# Patient Record
Sex: Male | Born: 1960
Health system: Southern US, Community
[De-identification: ages and names within clinical notes are randomized; demographics above are authoritative.]

## PROBLEM LIST (undated history)

## (undated) ENCOUNTER — Emergency Department (HOSPITAL_COMMUNITY): Admission: EM | Payer: MEDICAID | Source: Home / Self Care

## (undated) ENCOUNTER — Emergency Department (HOSPITAL_COMMUNITY): Admission: EM | Payer: Medicare Other | Source: Home / Self Care

## (undated) ENCOUNTER — Emergency Department (HOSPITAL_COMMUNITY): Admission: EM | Payer: Medicaid Other | Source: Home / Self Care

## (undated) VITALS — BP 144/110 | HR 85 | Temp 98.0°F | Resp 23 | Ht 69.0 in | Wt 246.0 lb

## (undated) DIAGNOSIS — Z5900 Homelessness unspecified: Secondary | ICD-10-CM

## (undated) DIAGNOSIS — M79673 Pain in unspecified foot: Secondary | ICD-10-CM

## (undated) DIAGNOSIS — F1910 Other psychoactive substance abuse, uncomplicated: Secondary | ICD-10-CM

## (undated) DIAGNOSIS — G8929 Other chronic pain: Secondary | ICD-10-CM

## (undated) DIAGNOSIS — B1920 Unspecified viral hepatitis C without hepatic coma: Secondary | ICD-10-CM

## (undated) DIAGNOSIS — I10 Essential (primary) hypertension: Secondary | ICD-10-CM

## (undated) DIAGNOSIS — F1410 Cocaine abuse, uncomplicated: Secondary | ICD-10-CM

## (undated) DIAGNOSIS — G4730 Sleep apnea, unspecified: Secondary | ICD-10-CM

## (undated) DIAGNOSIS — G629 Polyneuropathy, unspecified: Secondary | ICD-10-CM

## (undated) DIAGNOSIS — F209 Schizophrenia, unspecified: Secondary | ICD-10-CM

## (undated) DIAGNOSIS — E119 Type 2 diabetes mellitus without complications: Secondary | ICD-10-CM

## (undated) DIAGNOSIS — I5042 Chronic combined systolic (congestive) and diastolic (congestive) heart failure: Secondary | ICD-10-CM

## (undated) HISTORY — PX: MULTIPLE TOOTH EXTRACTIONS: SHX2053

---

## 1997-10-01 ENCOUNTER — Emergency Department (HOSPITAL_COMMUNITY): Admission: EM | Admit: 1997-10-01 | Discharge: 1997-10-01 | Payer: Self-pay | Admitting: Emergency Medicine

## 1997-11-23 ENCOUNTER — Emergency Department (HOSPITAL_COMMUNITY): Admission: EM | Admit: 1997-11-23 | Discharge: 1997-11-23 | Payer: Self-pay | Admitting: Emergency Medicine

## 1998-03-22 ENCOUNTER — Emergency Department (HOSPITAL_COMMUNITY): Admission: EM | Admit: 1998-03-22 | Discharge: 1998-03-22 | Payer: Self-pay | Admitting: Emergency Medicine

## 1998-04-10 ENCOUNTER — Emergency Department (HOSPITAL_COMMUNITY): Admission: EM | Admit: 1998-04-10 | Discharge: 1998-04-10 | Payer: Self-pay | Admitting: Emergency Medicine

## 1998-04-10 ENCOUNTER — Ambulatory Visit: Admission: RE | Admit: 1998-04-10 | Discharge: 1998-04-10 | Payer: Self-pay | Admitting: Internal Medicine

## 1998-04-10 ENCOUNTER — Ambulatory Visit (HOSPITAL_COMMUNITY): Admission: RE | Admit: 1998-04-10 | Discharge: 1998-04-10 | Payer: Self-pay | Admitting: Family Medicine

## 1998-04-22 ENCOUNTER — Ambulatory Visit: Admission: RE | Admit: 1998-04-22 | Discharge: 1998-04-22 | Payer: Self-pay | Admitting: Family Medicine

## 1999-07-23 ENCOUNTER — Emergency Department (HOSPITAL_COMMUNITY): Admission: EM | Admit: 1999-07-23 | Discharge: 1999-07-23 | Payer: Self-pay | Admitting: Emergency Medicine

## 1999-07-23 ENCOUNTER — Encounter: Payer: Self-pay | Admitting: Emergency Medicine

## 1999-08-31 ENCOUNTER — Encounter: Payer: Self-pay | Admitting: *Deleted

## 1999-08-31 ENCOUNTER — Emergency Department (HOSPITAL_COMMUNITY): Admission: EM | Admit: 1999-08-31 | Discharge: 1999-08-31 | Payer: Self-pay | Admitting: Emergency Medicine

## 1999-09-07 ENCOUNTER — Inpatient Hospital Stay (HOSPITAL_COMMUNITY): Admission: EM | Admit: 1999-09-07 | Discharge: 1999-09-10 | Payer: Self-pay | Admitting: *Deleted

## 1999-09-12 ENCOUNTER — Emergency Department (HOSPITAL_COMMUNITY): Admission: EM | Admit: 1999-09-12 | Discharge: 1999-09-12 | Payer: Self-pay | Admitting: Emergency Medicine

## 1999-10-03 ENCOUNTER — Emergency Department (HOSPITAL_COMMUNITY): Admission: EM | Admit: 1999-10-03 | Discharge: 1999-10-03 | Payer: Self-pay | Admitting: Emergency Medicine

## 1999-12-31 ENCOUNTER — Emergency Department (HOSPITAL_COMMUNITY): Admission: EM | Admit: 1999-12-31 | Discharge: 2000-01-01 | Payer: Self-pay | Admitting: Emergency Medicine

## 2000-01-01 ENCOUNTER — Encounter: Payer: Self-pay | Admitting: Emergency Medicine

## 2000-01-06 ENCOUNTER — Emergency Department (HOSPITAL_COMMUNITY): Admission: EM | Admit: 2000-01-06 | Discharge: 2000-01-06 | Payer: Self-pay | Admitting: Emergency Medicine

## 2000-01-10 ENCOUNTER — Emergency Department (HOSPITAL_COMMUNITY): Admission: EM | Admit: 2000-01-10 | Discharge: 2000-01-10 | Payer: Self-pay | Admitting: Emergency Medicine

## 2000-02-01 ENCOUNTER — Emergency Department (HOSPITAL_COMMUNITY): Admission: EM | Admit: 2000-02-01 | Discharge: 2000-02-01 | Payer: Self-pay | Admitting: Emergency Medicine

## 2000-02-11 ENCOUNTER — Encounter: Payer: Self-pay | Admitting: Internal Medicine

## 2000-02-11 ENCOUNTER — Inpatient Hospital Stay: Admission: EM | Admit: 2000-02-11 | Discharge: 2000-02-13 | Payer: Self-pay | Admitting: *Deleted

## 2000-02-12 ENCOUNTER — Encounter: Payer: Self-pay | Admitting: Internal Medicine

## 2000-02-13 ENCOUNTER — Encounter: Payer: Self-pay | Admitting: Internal Medicine

## 2000-02-13 ENCOUNTER — Emergency Department (HOSPITAL_COMMUNITY): Admission: EM | Admit: 2000-02-13 | Discharge: 2000-02-13 | Payer: Self-pay | Admitting: Emergency Medicine

## 2000-02-14 ENCOUNTER — Emergency Department (HOSPITAL_COMMUNITY): Admission: EM | Admit: 2000-02-14 | Discharge: 2000-02-14 | Payer: Self-pay | Admitting: Internal Medicine

## 2000-02-18 ENCOUNTER — Encounter: Admission: RE | Admit: 2000-02-18 | Discharge: 2000-05-18 | Payer: Self-pay | Admitting: Family Medicine

## 2000-02-21 ENCOUNTER — Inpatient Hospital Stay: Admission: EM | Admit: 2000-02-21 | Discharge: 2000-02-24 | Payer: Self-pay | Admitting: *Deleted

## 2000-03-05 ENCOUNTER — Inpatient Hospital Stay (HOSPITAL_COMMUNITY): Admission: EM | Admit: 2000-03-05 | Discharge: 2000-03-10 | Payer: Self-pay | Admitting: *Deleted

## 2000-03-09 ENCOUNTER — Encounter: Payer: Self-pay | Admitting: *Deleted

## 2000-03-12 ENCOUNTER — Emergency Department (HOSPITAL_COMMUNITY): Admission: EM | Admit: 2000-03-12 | Discharge: 2000-03-12 | Payer: Self-pay | Admitting: *Deleted

## 2000-03-15 ENCOUNTER — Emergency Department (HOSPITAL_COMMUNITY): Admission: EM | Admit: 2000-03-15 | Discharge: 2000-03-15 | Payer: Self-pay | Admitting: Emergency Medicine

## 2000-03-18 ENCOUNTER — Emergency Department (HOSPITAL_COMMUNITY): Admission: EM | Admit: 2000-03-18 | Discharge: 2000-03-18 | Payer: Self-pay | Admitting: Emergency Medicine

## 2000-03-20 ENCOUNTER — Emergency Department (HOSPITAL_COMMUNITY): Admission: EM | Admit: 2000-03-20 | Discharge: 2000-03-20 | Payer: Self-pay | Admitting: Emergency Medicine

## 2000-03-21 ENCOUNTER — Emergency Department (HOSPITAL_COMMUNITY): Admission: EM | Admit: 2000-03-21 | Discharge: 2000-03-21 | Payer: Self-pay | Admitting: Emergency Medicine

## 2000-03-25 ENCOUNTER — Emergency Department (HOSPITAL_COMMUNITY): Admission: EM | Admit: 2000-03-25 | Discharge: 2000-03-25 | Payer: Self-pay | Admitting: *Deleted

## 2000-04-01 ENCOUNTER — Emergency Department (HOSPITAL_COMMUNITY): Admission: EM | Admit: 2000-04-01 | Discharge: 2000-04-02 | Payer: Self-pay | Admitting: Emergency Medicine

## 2000-04-03 ENCOUNTER — Emergency Department (HOSPITAL_COMMUNITY): Admission: EM | Admit: 2000-04-03 | Discharge: 2000-04-03 | Payer: Self-pay | Admitting: Emergency Medicine

## 2000-04-06 ENCOUNTER — Emergency Department (HOSPITAL_COMMUNITY): Admission: EM | Admit: 2000-04-06 | Discharge: 2000-04-06 | Payer: Self-pay | Admitting: Emergency Medicine

## 2000-04-25 ENCOUNTER — Emergency Department (HOSPITAL_COMMUNITY): Admission: EM | Admit: 2000-04-25 | Discharge: 2000-04-25 | Payer: Self-pay | Admitting: Emergency Medicine

## 2000-05-06 ENCOUNTER — Inpatient Hospital Stay (HOSPITAL_COMMUNITY): Admission: EM | Admit: 2000-05-06 | Discharge: 2000-05-08 | Payer: Self-pay | Admitting: *Deleted

## 2000-06-05 ENCOUNTER — Emergency Department (HOSPITAL_COMMUNITY): Admission: EM | Admit: 2000-06-05 | Discharge: 2000-06-05 | Payer: Self-pay | Admitting: Emergency Medicine

## 2000-10-08 ENCOUNTER — Emergency Department (HOSPITAL_COMMUNITY): Admission: EM | Admit: 2000-10-08 | Discharge: 2000-10-09 | Payer: Self-pay | Admitting: Emergency Medicine

## 2000-11-23 ENCOUNTER — Emergency Department (HOSPITAL_COMMUNITY): Admission: EM | Admit: 2000-11-23 | Discharge: 2000-11-23 | Payer: Self-pay | Admitting: Emergency Medicine

## 2000-11-24 ENCOUNTER — Emergency Department (HOSPITAL_COMMUNITY): Admission: EM | Admit: 2000-11-24 | Discharge: 2000-11-24 | Payer: Self-pay | Admitting: Emergency Medicine

## 2000-11-24 ENCOUNTER — Encounter: Payer: Self-pay | Admitting: Emergency Medicine

## 2000-11-26 ENCOUNTER — Emergency Department (HOSPITAL_COMMUNITY): Admission: EM | Admit: 2000-11-26 | Discharge: 2000-11-26 | Payer: Self-pay | Admitting: Emergency Medicine

## 2000-11-27 ENCOUNTER — Emergency Department (HOSPITAL_COMMUNITY): Admission: EM | Admit: 2000-11-27 | Discharge: 2000-11-27 | Payer: Self-pay | Admitting: Emergency Medicine

## 2000-11-27 ENCOUNTER — Inpatient Hospital Stay (HOSPITAL_COMMUNITY): Admission: EM | Admit: 2000-11-27 | Discharge: 2000-11-30 | Payer: Self-pay | Admitting: Emergency Medicine

## 2000-12-01 ENCOUNTER — Emergency Department (HOSPITAL_COMMUNITY): Admission: EM | Admit: 2000-12-01 | Discharge: 2000-12-02 | Payer: Self-pay | Admitting: Emergency Medicine

## 2000-12-01 ENCOUNTER — Emergency Department (HOSPITAL_COMMUNITY): Admission: EM | Admit: 2000-12-01 | Discharge: 2000-12-01 | Payer: Self-pay | Admitting: Emergency Medicine

## 2000-12-03 ENCOUNTER — Emergency Department (HOSPITAL_COMMUNITY): Admission: EM | Admit: 2000-12-03 | Discharge: 2000-12-03 | Payer: Self-pay | Admitting: *Deleted

## 2000-12-05 ENCOUNTER — Emergency Department (HOSPITAL_COMMUNITY): Admission: EM | Admit: 2000-12-05 | Discharge: 2000-12-05 | Payer: Self-pay

## 2000-12-08 ENCOUNTER — Emergency Department (HOSPITAL_COMMUNITY): Admission: EM | Admit: 2000-12-08 | Discharge: 2000-12-08 | Payer: Self-pay

## 2000-12-11 ENCOUNTER — Emergency Department (HOSPITAL_COMMUNITY): Admission: EM | Admit: 2000-12-11 | Discharge: 2000-12-11 | Payer: Self-pay | Admitting: Emergency Medicine

## 2000-12-13 ENCOUNTER — Emergency Department (HOSPITAL_COMMUNITY): Admission: EM | Admit: 2000-12-13 | Discharge: 2000-12-13 | Payer: Self-pay | Admitting: Emergency Medicine

## 2001-02-03 ENCOUNTER — Emergency Department (HOSPITAL_COMMUNITY): Admission: EM | Admit: 2001-02-03 | Discharge: 2001-02-03 | Payer: Self-pay

## 2001-02-16 ENCOUNTER — Emergency Department (HOSPITAL_COMMUNITY): Admission: EM | Admit: 2001-02-16 | Discharge: 2001-02-17 | Payer: Self-pay | Admitting: Emergency Medicine

## 2001-02-16 ENCOUNTER — Encounter: Payer: Self-pay | Admitting: Emergency Medicine

## 2003-07-09 ENCOUNTER — Emergency Department (HOSPITAL_COMMUNITY): Admission: EM | Admit: 2003-07-09 | Discharge: 2003-07-09 | Payer: Self-pay | Admitting: Emergency Medicine

## 2003-08-11 ENCOUNTER — Emergency Department (HOSPITAL_COMMUNITY): Admission: EM | Admit: 2003-08-11 | Discharge: 2003-08-11 | Payer: Self-pay | Admitting: Emergency Medicine

## 2003-08-22 ENCOUNTER — Emergency Department (HOSPITAL_COMMUNITY): Admission: EM | Admit: 2003-08-22 | Discharge: 2003-08-23 | Payer: Self-pay | Admitting: Emergency Medicine

## 2003-09-29 ENCOUNTER — Emergency Department (HOSPITAL_COMMUNITY): Admission: EM | Admit: 2003-09-29 | Discharge: 2003-09-30 | Payer: Self-pay | Admitting: Emergency Medicine

## 2003-10-07 ENCOUNTER — Emergency Department (HOSPITAL_COMMUNITY): Admission: EM | Admit: 2003-10-07 | Discharge: 2003-10-08 | Payer: Self-pay | Admitting: Emergency Medicine

## 2003-11-24 ENCOUNTER — Emergency Department (HOSPITAL_COMMUNITY): Admission: EM | Admit: 2003-11-24 | Discharge: 2003-11-24 | Payer: Self-pay | Admitting: Emergency Medicine

## 2003-12-04 ENCOUNTER — Emergency Department (HOSPITAL_COMMUNITY): Admission: EM | Admit: 2003-12-04 | Discharge: 2003-12-04 | Payer: Self-pay | Admitting: Emergency Medicine

## 2003-12-08 ENCOUNTER — Emergency Department (HOSPITAL_COMMUNITY): Admission: EM | Admit: 2003-12-08 | Discharge: 2003-12-08 | Payer: Self-pay | Admitting: Emergency Medicine

## 2003-12-15 ENCOUNTER — Emergency Department (HOSPITAL_COMMUNITY): Admission: EM | Admit: 2003-12-15 | Discharge: 2003-12-16 | Payer: Self-pay | Admitting: Emergency Medicine

## 2003-12-25 ENCOUNTER — Emergency Department (HOSPITAL_COMMUNITY): Admission: EM | Admit: 2003-12-25 | Discharge: 2003-12-25 | Payer: Self-pay | Admitting: *Deleted

## 2004-01-02 ENCOUNTER — Emergency Department (HOSPITAL_COMMUNITY): Admission: EM | Admit: 2004-01-02 | Discharge: 2004-01-02 | Payer: Self-pay | Admitting: Emergency Medicine

## 2004-01-05 ENCOUNTER — Inpatient Hospital Stay (HOSPITAL_COMMUNITY): Admission: AD | Admit: 2004-01-05 | Discharge: 2004-01-10 | Payer: Self-pay | Admitting: Psychiatry

## 2004-01-13 ENCOUNTER — Emergency Department (HOSPITAL_COMMUNITY): Admission: EM | Admit: 2004-01-13 | Discharge: 2004-01-14 | Payer: Self-pay | Admitting: Emergency Medicine

## 2004-01-15 ENCOUNTER — Emergency Department (HOSPITAL_COMMUNITY): Admission: EM | Admit: 2004-01-15 | Discharge: 2004-01-15 | Payer: Self-pay | Admitting: Emergency Medicine

## 2004-01-18 ENCOUNTER — Emergency Department (HOSPITAL_COMMUNITY): Admission: EM | Admit: 2004-01-18 | Discharge: 2004-01-19 | Payer: Self-pay | Admitting: Emergency Medicine

## 2004-01-25 ENCOUNTER — Emergency Department (HOSPITAL_COMMUNITY): Admission: EM | Admit: 2004-01-25 | Discharge: 2004-01-25 | Payer: Self-pay | Admitting: Emergency Medicine

## 2004-01-26 ENCOUNTER — Emergency Department (HOSPITAL_COMMUNITY): Admission: EM | Admit: 2004-01-26 | Discharge: 2004-01-26 | Payer: Self-pay | Admitting: Emergency Medicine

## 2004-03-09 ENCOUNTER — Ambulatory Visit: Payer: Self-pay | Admitting: Family Medicine

## 2004-04-13 ENCOUNTER — Emergency Department (HOSPITAL_COMMUNITY): Admission: EM | Admit: 2004-04-13 | Discharge: 2004-04-13 | Payer: Self-pay | Admitting: Emergency Medicine

## 2004-05-09 ENCOUNTER — Emergency Department (HOSPITAL_COMMUNITY): Admission: EM | Admit: 2004-05-09 | Discharge: 2004-05-09 | Payer: Self-pay | Admitting: Emergency Medicine

## 2004-06-04 ENCOUNTER — Emergency Department (HOSPITAL_COMMUNITY): Admission: EM | Admit: 2004-06-04 | Discharge: 2004-06-04 | Payer: Self-pay | Admitting: Emergency Medicine

## 2004-08-08 IMAGING — CR DG ABDOMEN ACUTE W/ 1V CHEST
3 series · 3 of 3 positions shown · non-contrast
Comparison: none

CLINICAL DATA: Right-sided abdominal pain.  
 ABDOMEN TWO VIEWS
 Bowel gas pattern is unremarkable without evidence of ileus, obstruction, or free air.  No worrisome calcifications or bony findings. 
 CHEST
 Heart and mediastinum are normal.  The lungs are clear.  No free air is seen under the diaphragm.  
 IMPRESSION
 Negative acute abdominal series.

[view not recorded (1 of 3)]
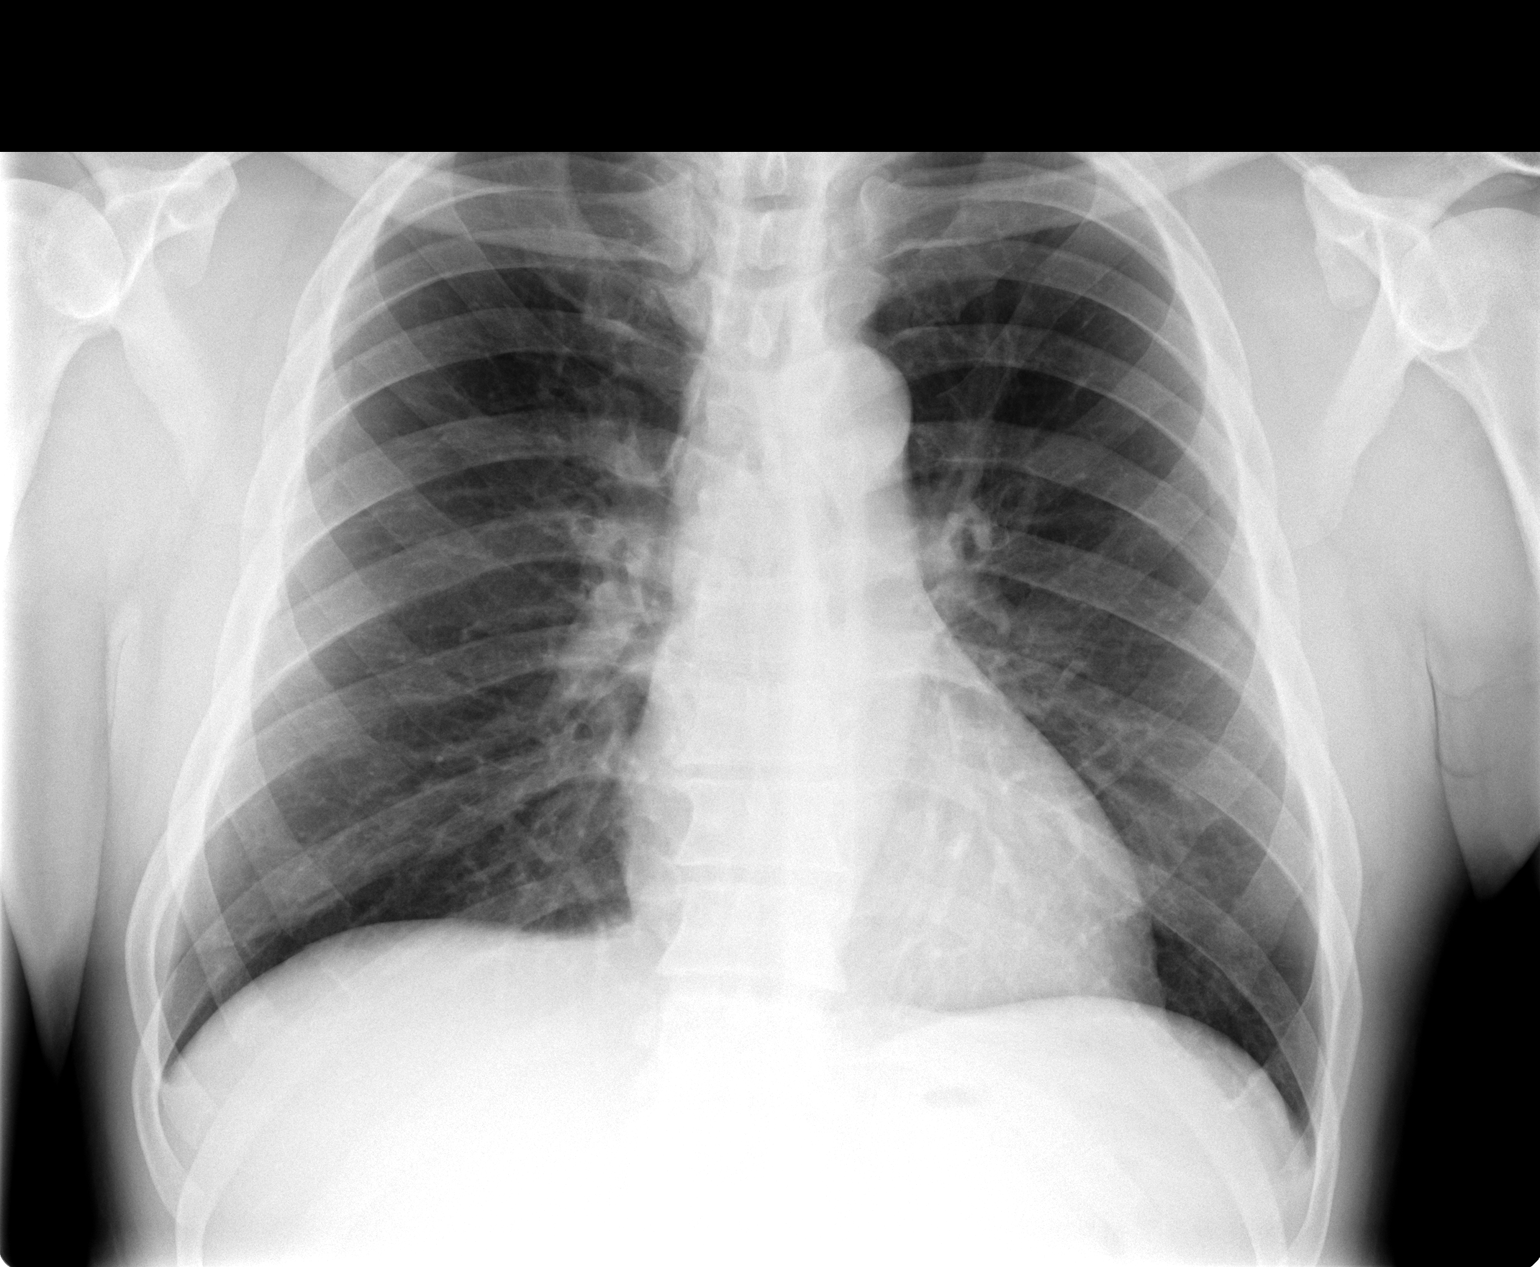

[view not recorded (2 of 3)]
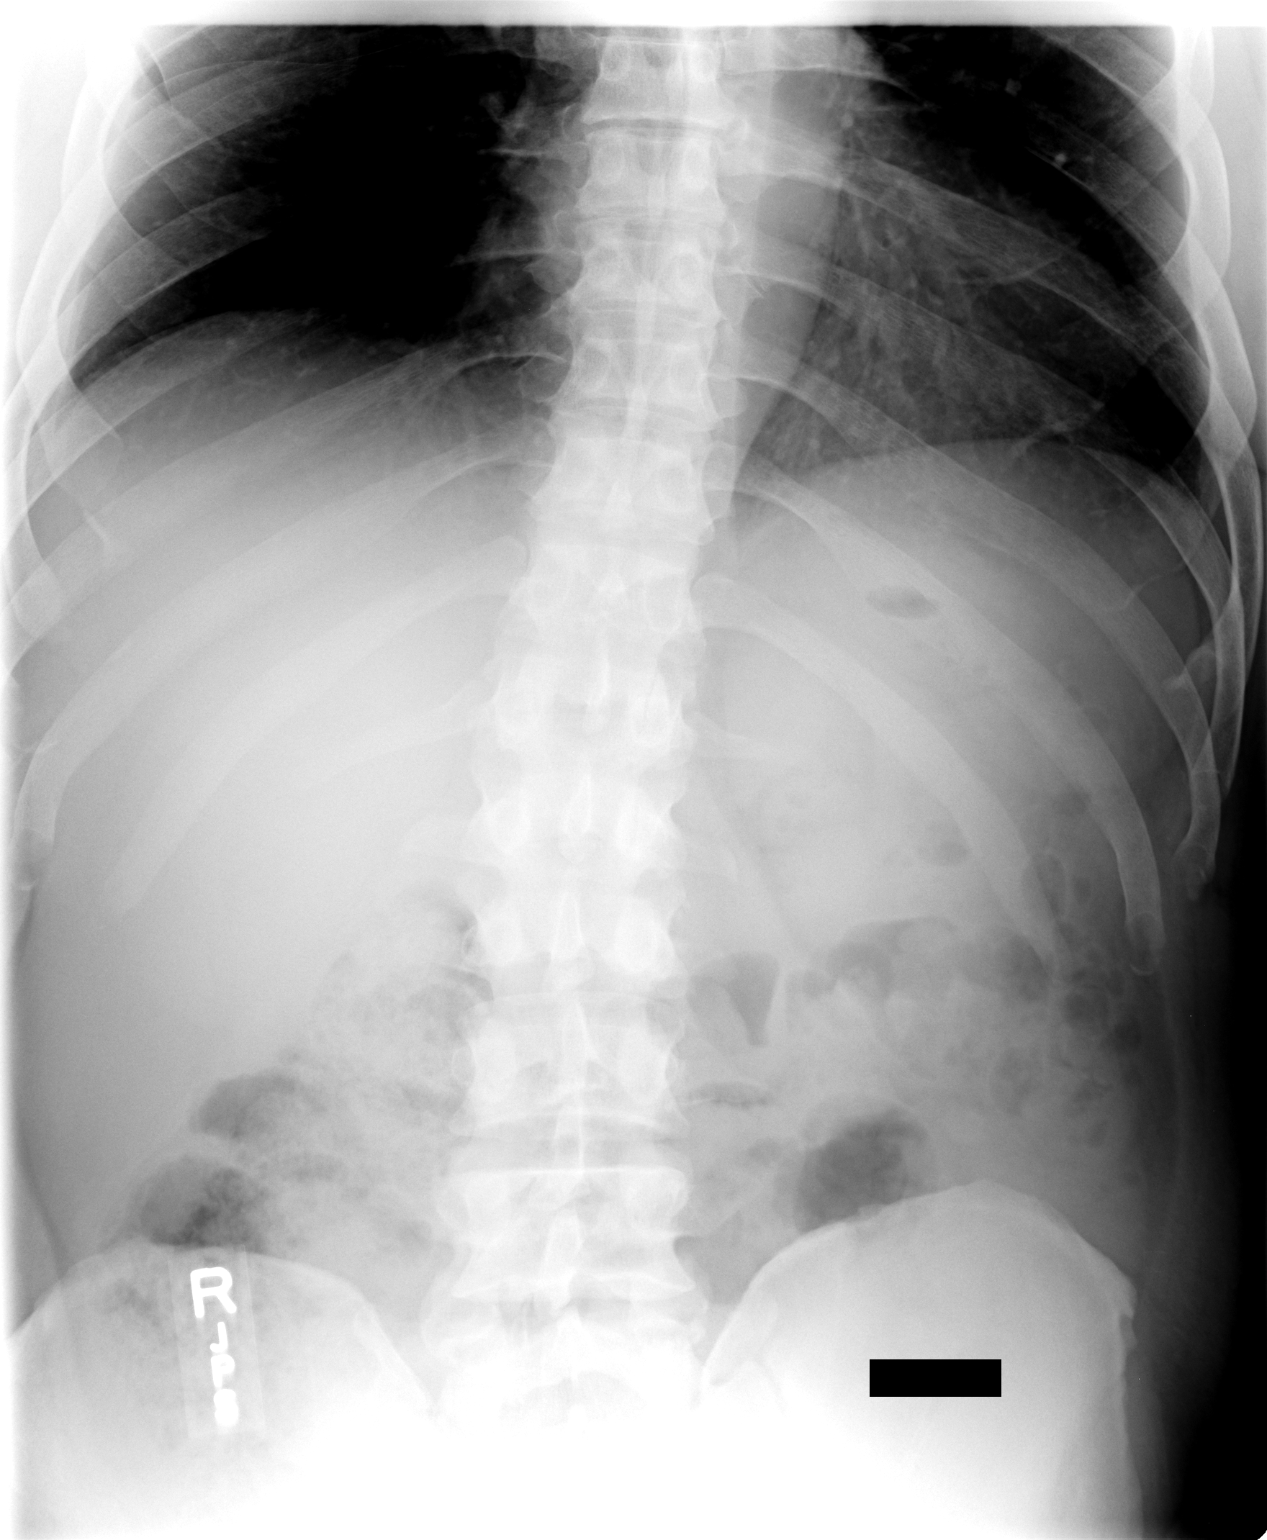

[view not recorded (3 of 3)]
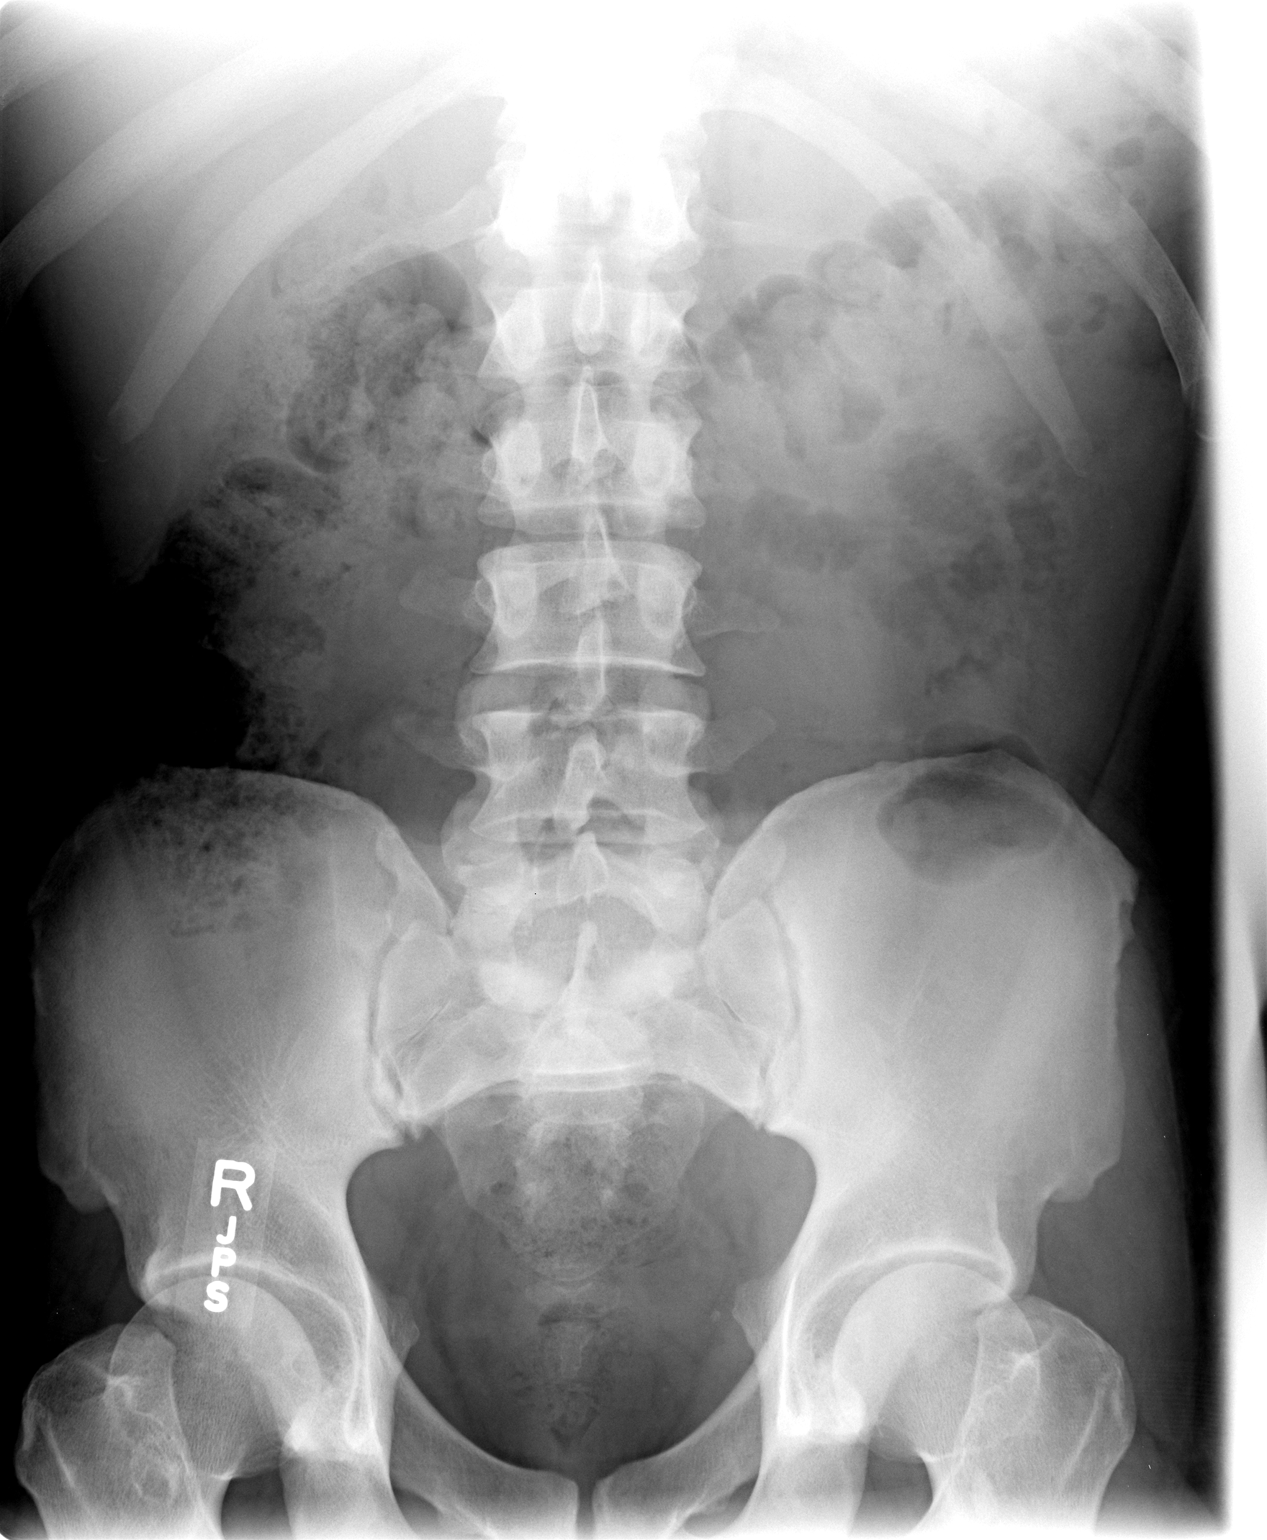

[3 of 3 positions shown; findings below may reference images not displayed]

## 2004-08-10 ENCOUNTER — Ambulatory Visit: Payer: Self-pay | Admitting: Family Medicine

## 2004-09-14 ENCOUNTER — Emergency Department (HOSPITAL_COMMUNITY): Admission: EM | Admit: 2004-09-14 | Discharge: 2004-09-14 | Payer: Self-pay | Admitting: *Deleted

## 2004-09-19 ENCOUNTER — Emergency Department (HOSPITAL_COMMUNITY): Admission: EM | Admit: 2004-09-19 | Discharge: 2004-09-19 | Payer: Self-pay | Admitting: Emergency Medicine

## 2004-09-28 ENCOUNTER — Emergency Department (HOSPITAL_COMMUNITY): Admission: EM | Admit: 2004-09-28 | Discharge: 2004-09-28 | Payer: Self-pay | Admitting: Emergency Medicine

## 2004-10-15 ENCOUNTER — Emergency Department (HOSPITAL_COMMUNITY): Admission: EM | Admit: 2004-10-15 | Discharge: 2004-10-15 | Payer: Self-pay | Admitting: Emergency Medicine

## 2004-11-01 ENCOUNTER — Emergency Department (HOSPITAL_COMMUNITY): Admission: EM | Admit: 2004-11-01 | Discharge: 2004-11-01 | Payer: Self-pay | Admitting: Emergency Medicine

## 2004-11-05 ENCOUNTER — Emergency Department (HOSPITAL_COMMUNITY): Admission: EM | Admit: 2004-11-05 | Discharge: 2004-11-06 | Payer: Self-pay | Admitting: Emergency Medicine

## 2005-04-01 ENCOUNTER — Ambulatory Visit: Payer: Self-pay | Admitting: Internal Medicine

## 2005-04-01 ENCOUNTER — Inpatient Hospital Stay (HOSPITAL_COMMUNITY): Admission: EM | Admit: 2005-04-01 | Discharge: 2005-04-03 | Payer: Self-pay | Admitting: Emergency Medicine

## 2005-04-02 ENCOUNTER — Ambulatory Visit: Payer: Self-pay | Admitting: Critical Care Medicine

## 2005-04-03 ENCOUNTER — Ambulatory Visit: Payer: Self-pay | Admitting: Cardiology

## 2005-04-03 ENCOUNTER — Encounter: Payer: Self-pay | Admitting: Cardiology

## 2005-04-07 ENCOUNTER — Emergency Department (HOSPITAL_COMMUNITY): Admission: EM | Admit: 2005-04-07 | Discharge: 2005-04-07 | Payer: Self-pay | Admitting: Emergency Medicine
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
## 2005-06-01 ENCOUNTER — Emergency Department (HOSPITAL_COMMUNITY): Admission: EM | Admit: 2005-06-01 | Discharge: 2005-06-01 | Payer: Self-pay | Admitting: Emergency Medicine

## 2005-06-13 ENCOUNTER — Emergency Department (HOSPITAL_COMMUNITY): Admission: EM | Admit: 2005-06-13 | Discharge: 2005-06-13 | Payer: Self-pay | Admitting: Emergency Medicine

## 2005-06-15 ENCOUNTER — Emergency Department (HOSPITAL_COMMUNITY): Admission: EM | Admit: 2005-06-15 | Discharge: 2005-06-15 | Payer: Self-pay | Admitting: Emergency Medicine

## 2005-06-22 ENCOUNTER — Emergency Department (HOSPITAL_COMMUNITY): Admission: EM | Admit: 2005-06-22 | Discharge: 2005-06-22 | Payer: Self-pay | Admitting: Emergency Medicine

## 2005-06-28 ENCOUNTER — Emergency Department (HOSPITAL_COMMUNITY): Admission: EM | Admit: 2005-06-28 | Discharge: 2005-06-29 | Payer: Self-pay | Admitting: Emergency Medicine

## 2005-10-13 IMAGING — CT CT CHEST W/ CM
3 series · 17 of 29 positions shown, 19 images · IV contrast (100 ML OMNI 300)
Comparison: Chest x-ray performed on 04/01/05.

CLINICAL DATA: Hemoptysis.  Chest pain. 
CHEST CT WITH CONTRAST:
TECHNIQUE: Multidetector CT imaging of the chest was performed following the standard protocol during bolus administration of intravenous contrast.
Contrast:   100 cc Omnipaque 300

[Series 2: routine chest · axial · 0.70mm/px · z∈[-243,-43]mm · 6 of 62 slices shown, 8 images]
[im 11/62  mediastinal]
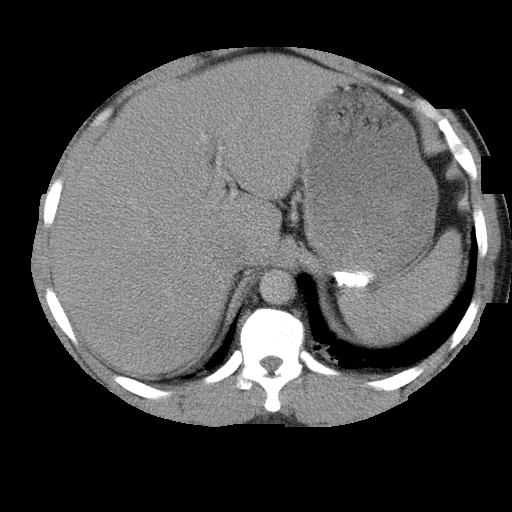
[im 11/62  lung]
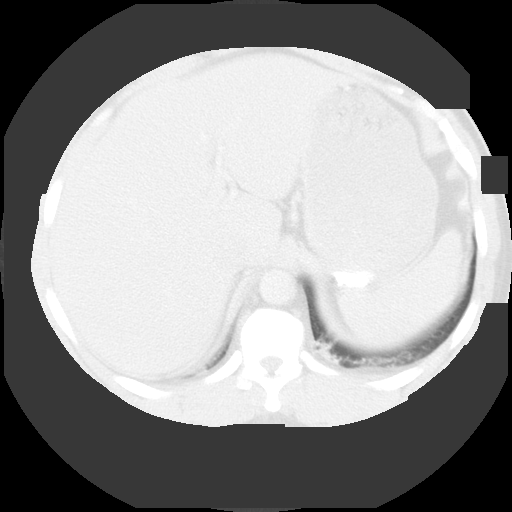
[im 21/62  lung]
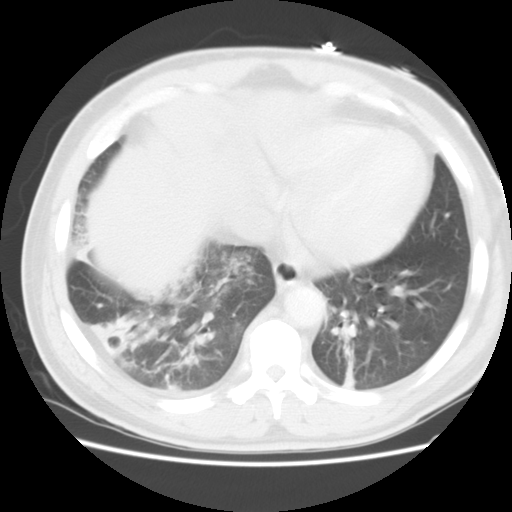
[im 31/62  lung]
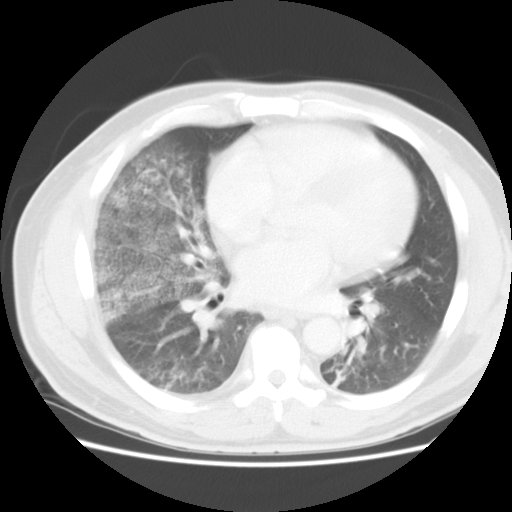
[im 33/62  lung]
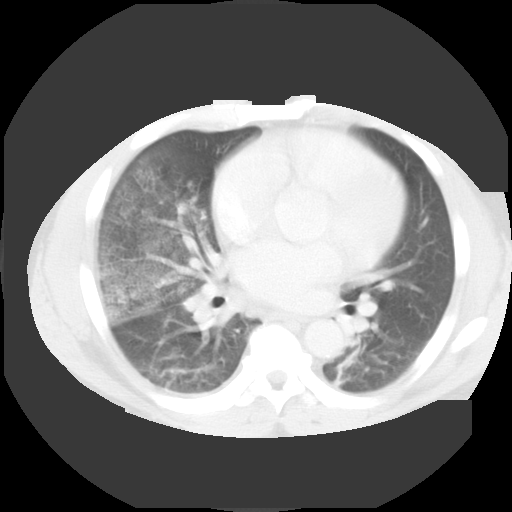
[im 41/62  mediastinal]
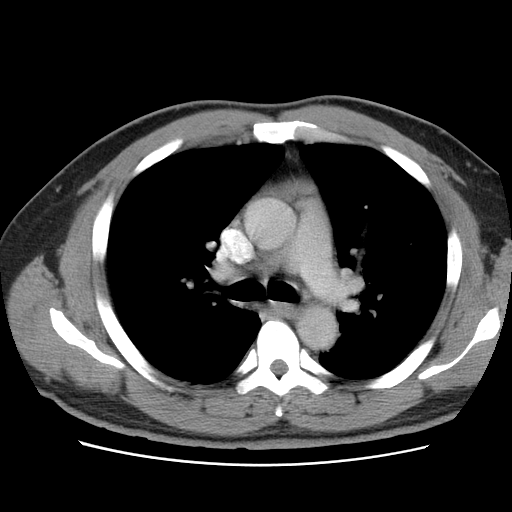
[im 41/62  lung]
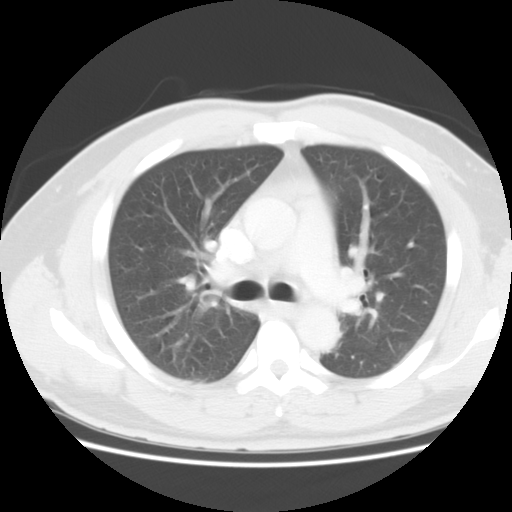
[im 51/62  lung]
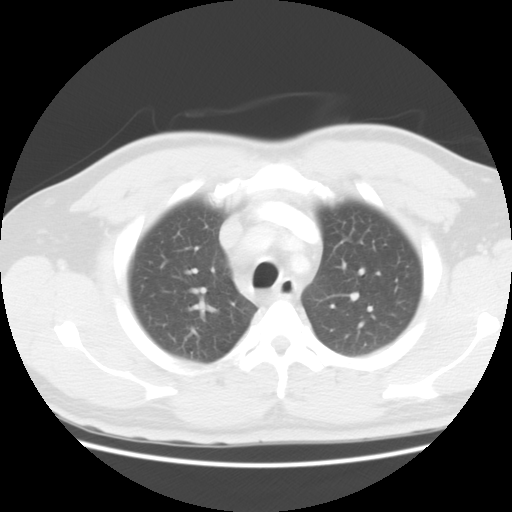

[Series 103: reformatted · sagittal · 0.70mm/px · 8 of 133 slices shown (1 of 2)]
[im 12/133  lung]
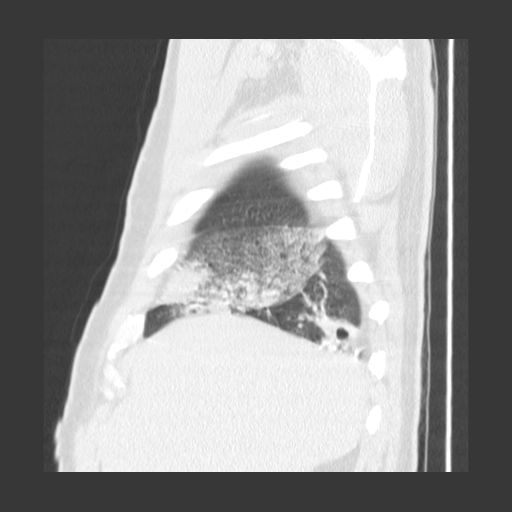
[im 34/133  lung]
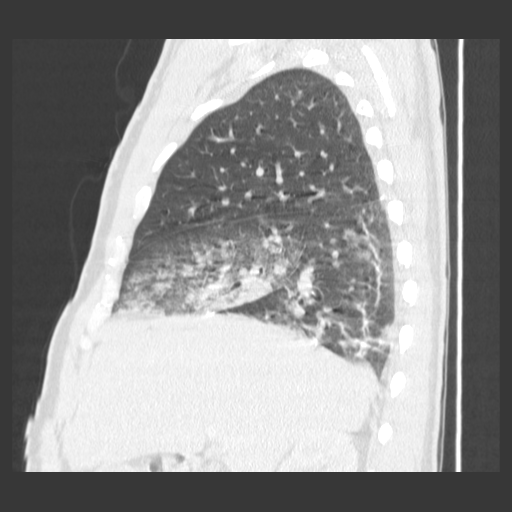
[im 45/133  lung]
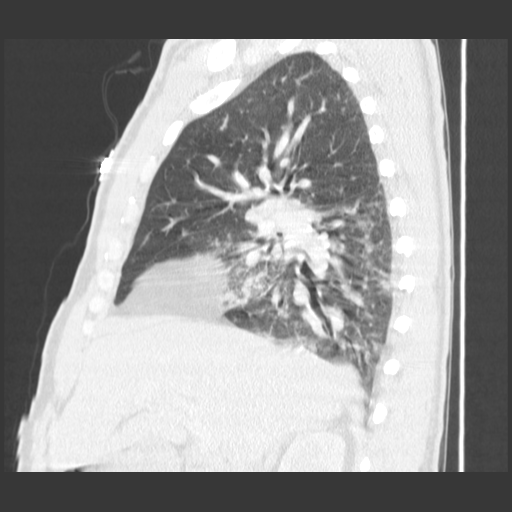
[im 56/133  lung]
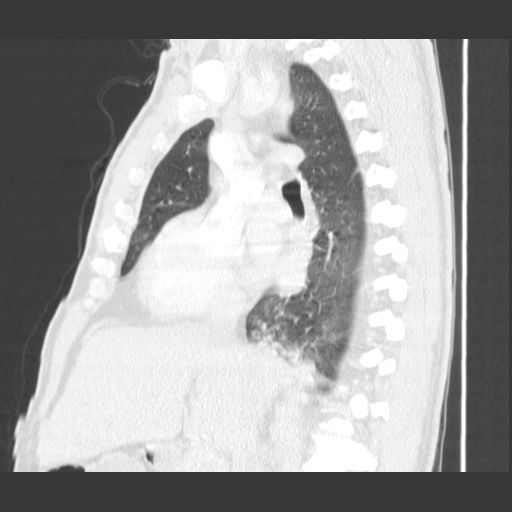
[im 78/133  lung]
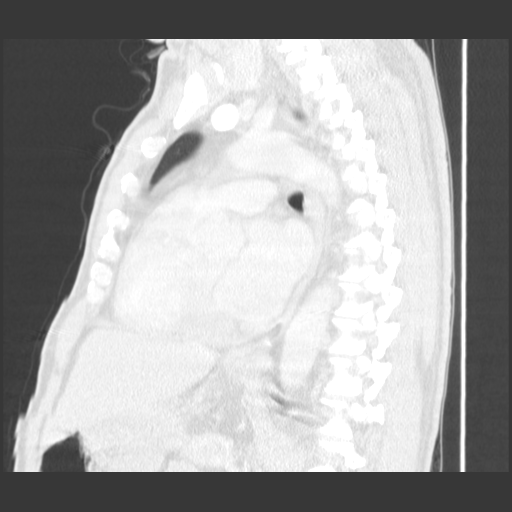
[im 89/133  lung]
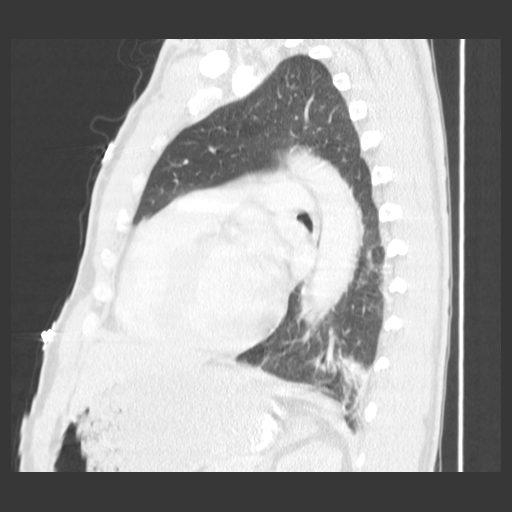
[im 100/133  lung]
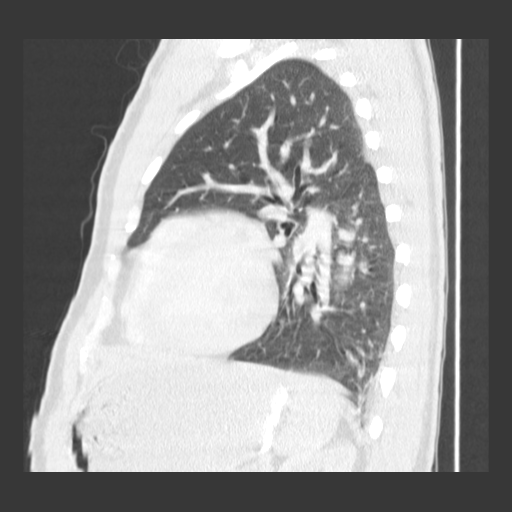
[im 122/133  lung]
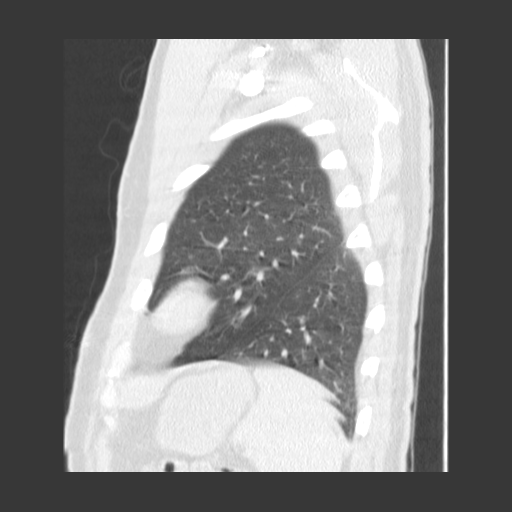

[Series 104: reformatted · coronal · 0.70mm/px · 3 of 84 slices shown (2 of 2)]
[im 12/84  lung]
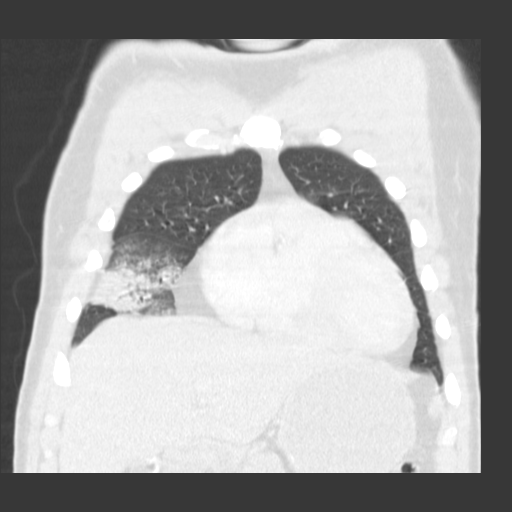
[im 24/84  lung]
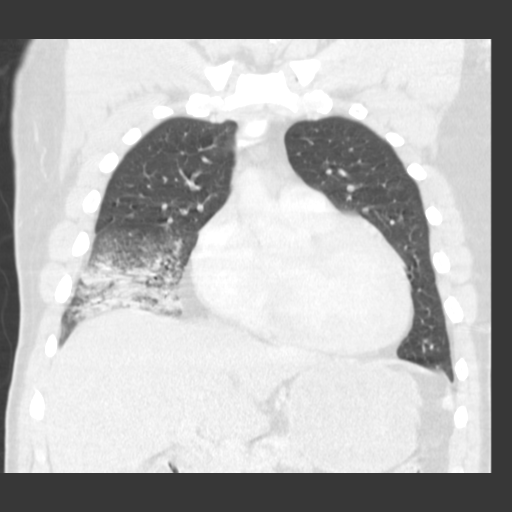
[im 36/84  lung]
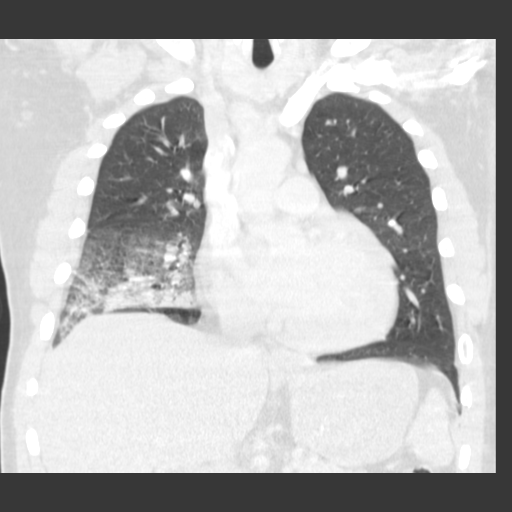

[17 of 29 positions shown; findings below may reference images not displayed]

FINDINGS: Airspace disease/consolidation in the right middle lobe identified and to a lesser extent involving the right lower lobe.  A tiny right pleural effusion is identified.  Mild left basilar atelectasis is present.  There is no evidence of enlarged lymph nodes, left pleural effusion or pericardial effusion.  
The visualized thyroid gland is unremarkable.
IMPRESSION: Diffuse airspace disease/consolidation within the right middle lobe and to a lesser extent right lower lobe likely representing pneumonia.  Differential also includes hemorrhage in this patient with hemoptysis.  ctive tuberculosis not excluded given history.

## 2005-10-13 IMAGING — CR DG CHEST 2V
2 series · 2 of 2 positions shown · non-contrast
Comparison: Portable chest x-ray 11/05/2004.

CLINICAL DATA: Hemoptysis.

CHEST - 2 VIEW  04/01/2005:

[w chest pa]
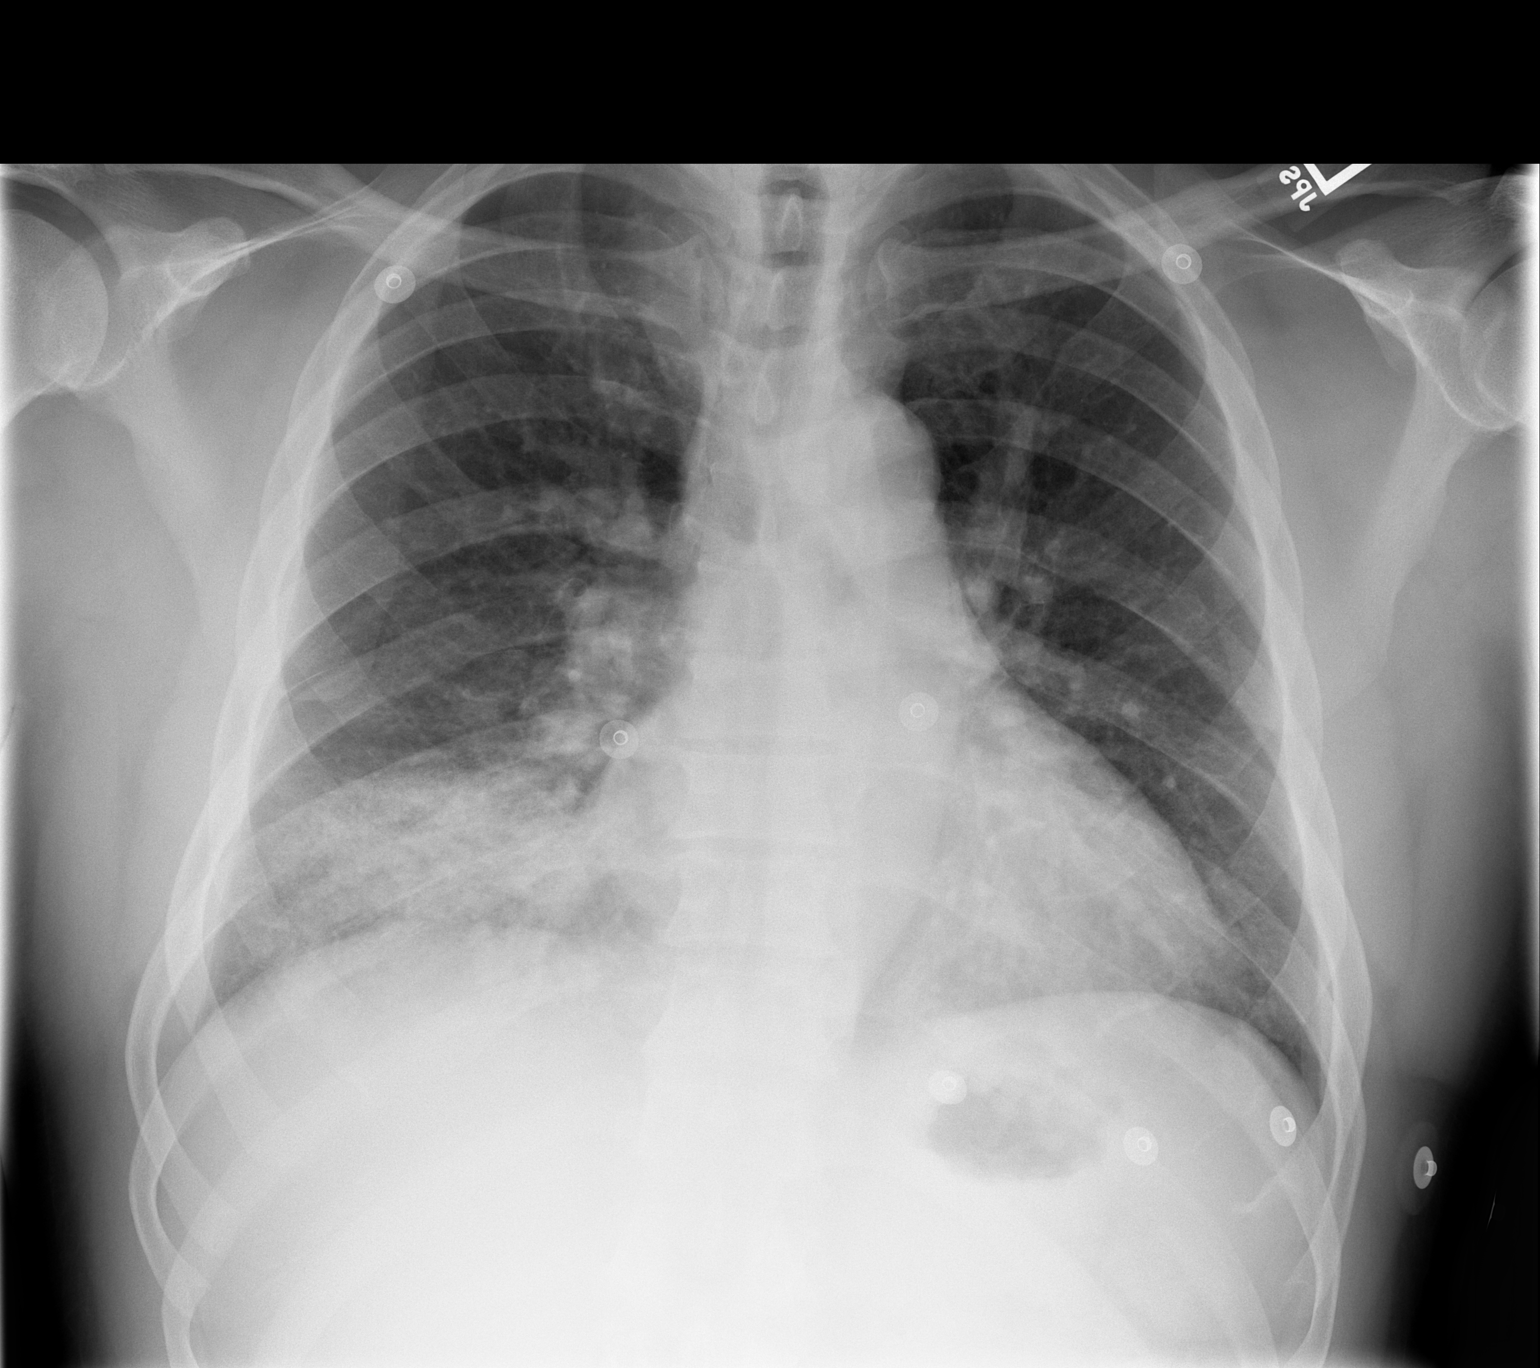

[w chest lat]
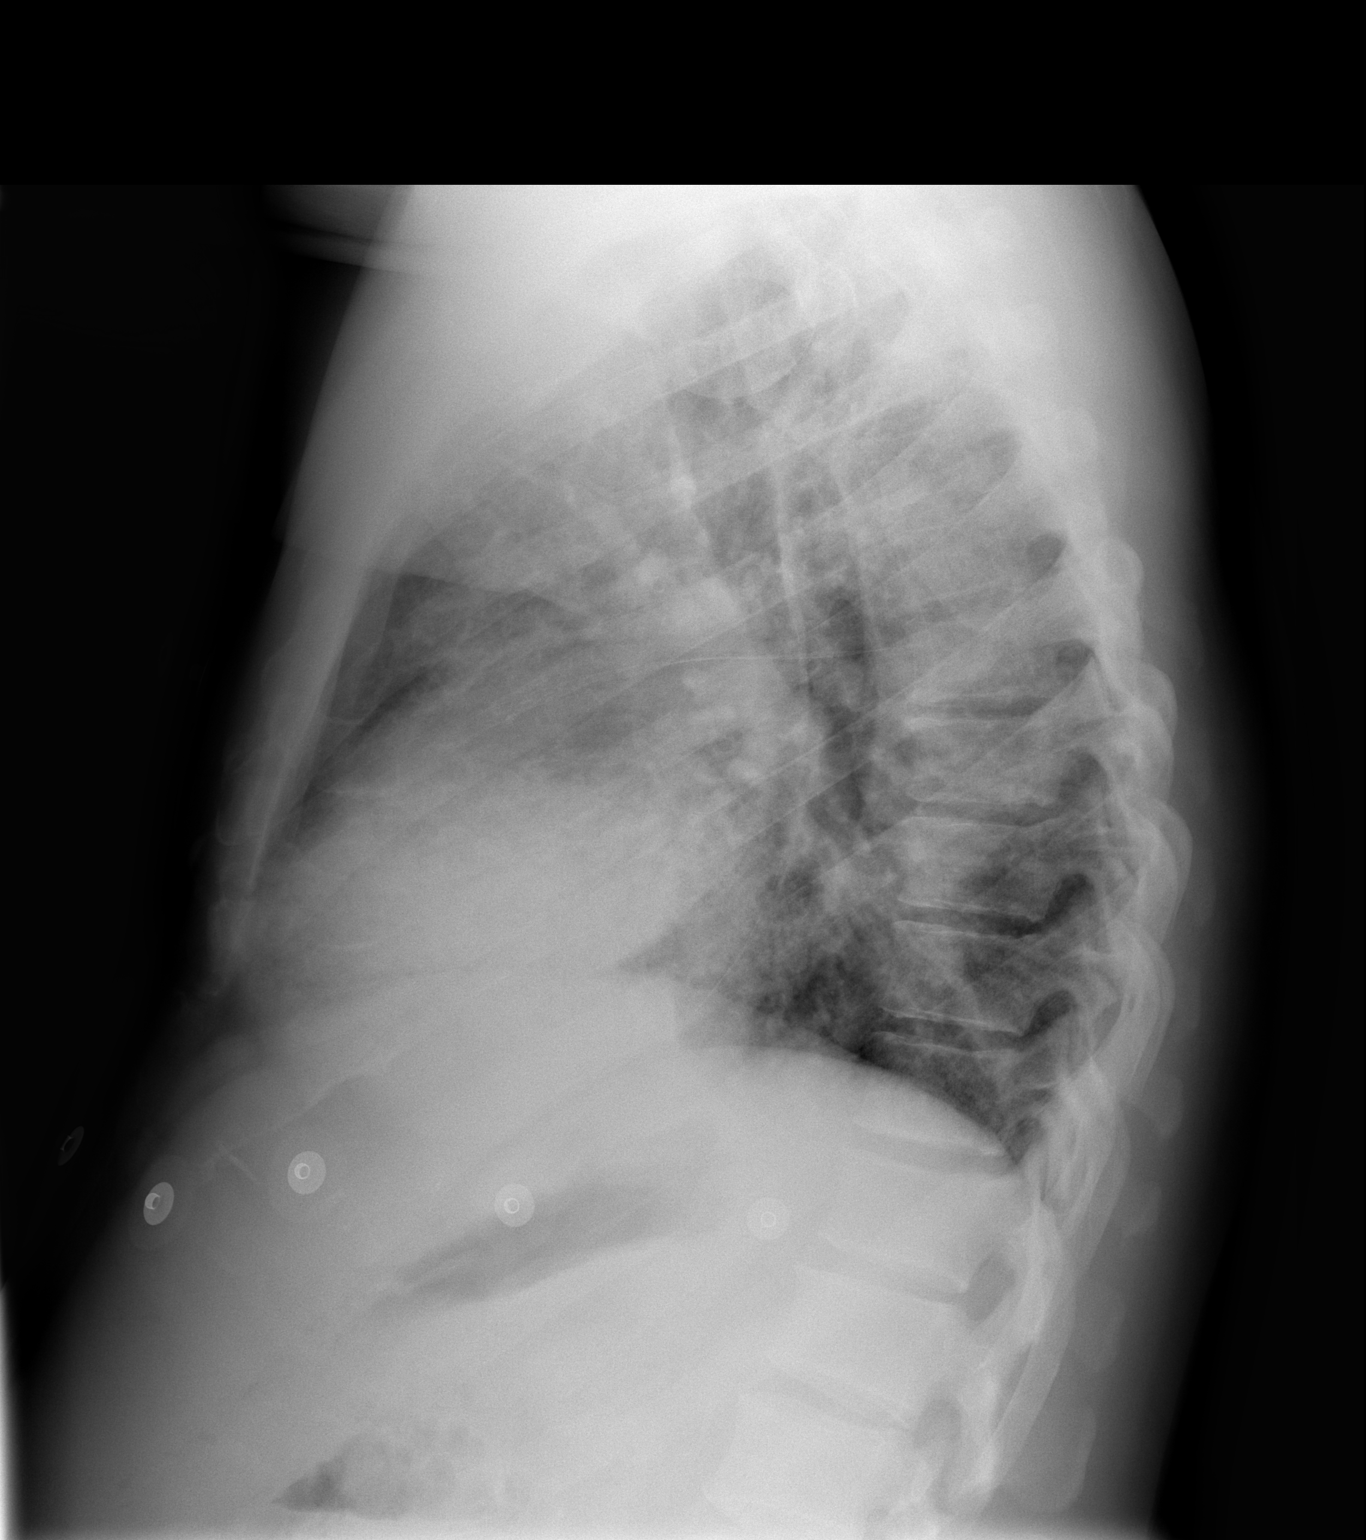

[2 of 2 positions shown; findings below may reference images not displayed]

FINDINGS: There is consolidation in the right middle lobe and minimally in the
right lower lobe. The lungs are otherwise clear. The heart is mildly enlarged
but stable. The thoracic aorta is mildly tortuous. Mild degenerative changes are
present in the midthoracic spine.
IMPRESSION: Right middle lobe and right lower lobe pneumonia versus hemorrhage in this
patient with hemoptysis.

## 2005-10-19 IMAGING — CR DG CHEST 2V
2 series · 2 of 2 positions shown · non-contrast
Comparison: Two view chest x-ray 04/01/2005.

CLINICAL DATA: Persistent cough. Followup right middle and right lower lobe
pneumonia.

CHEST - 2 VIEW  04/07/2005:

[w chest pa]
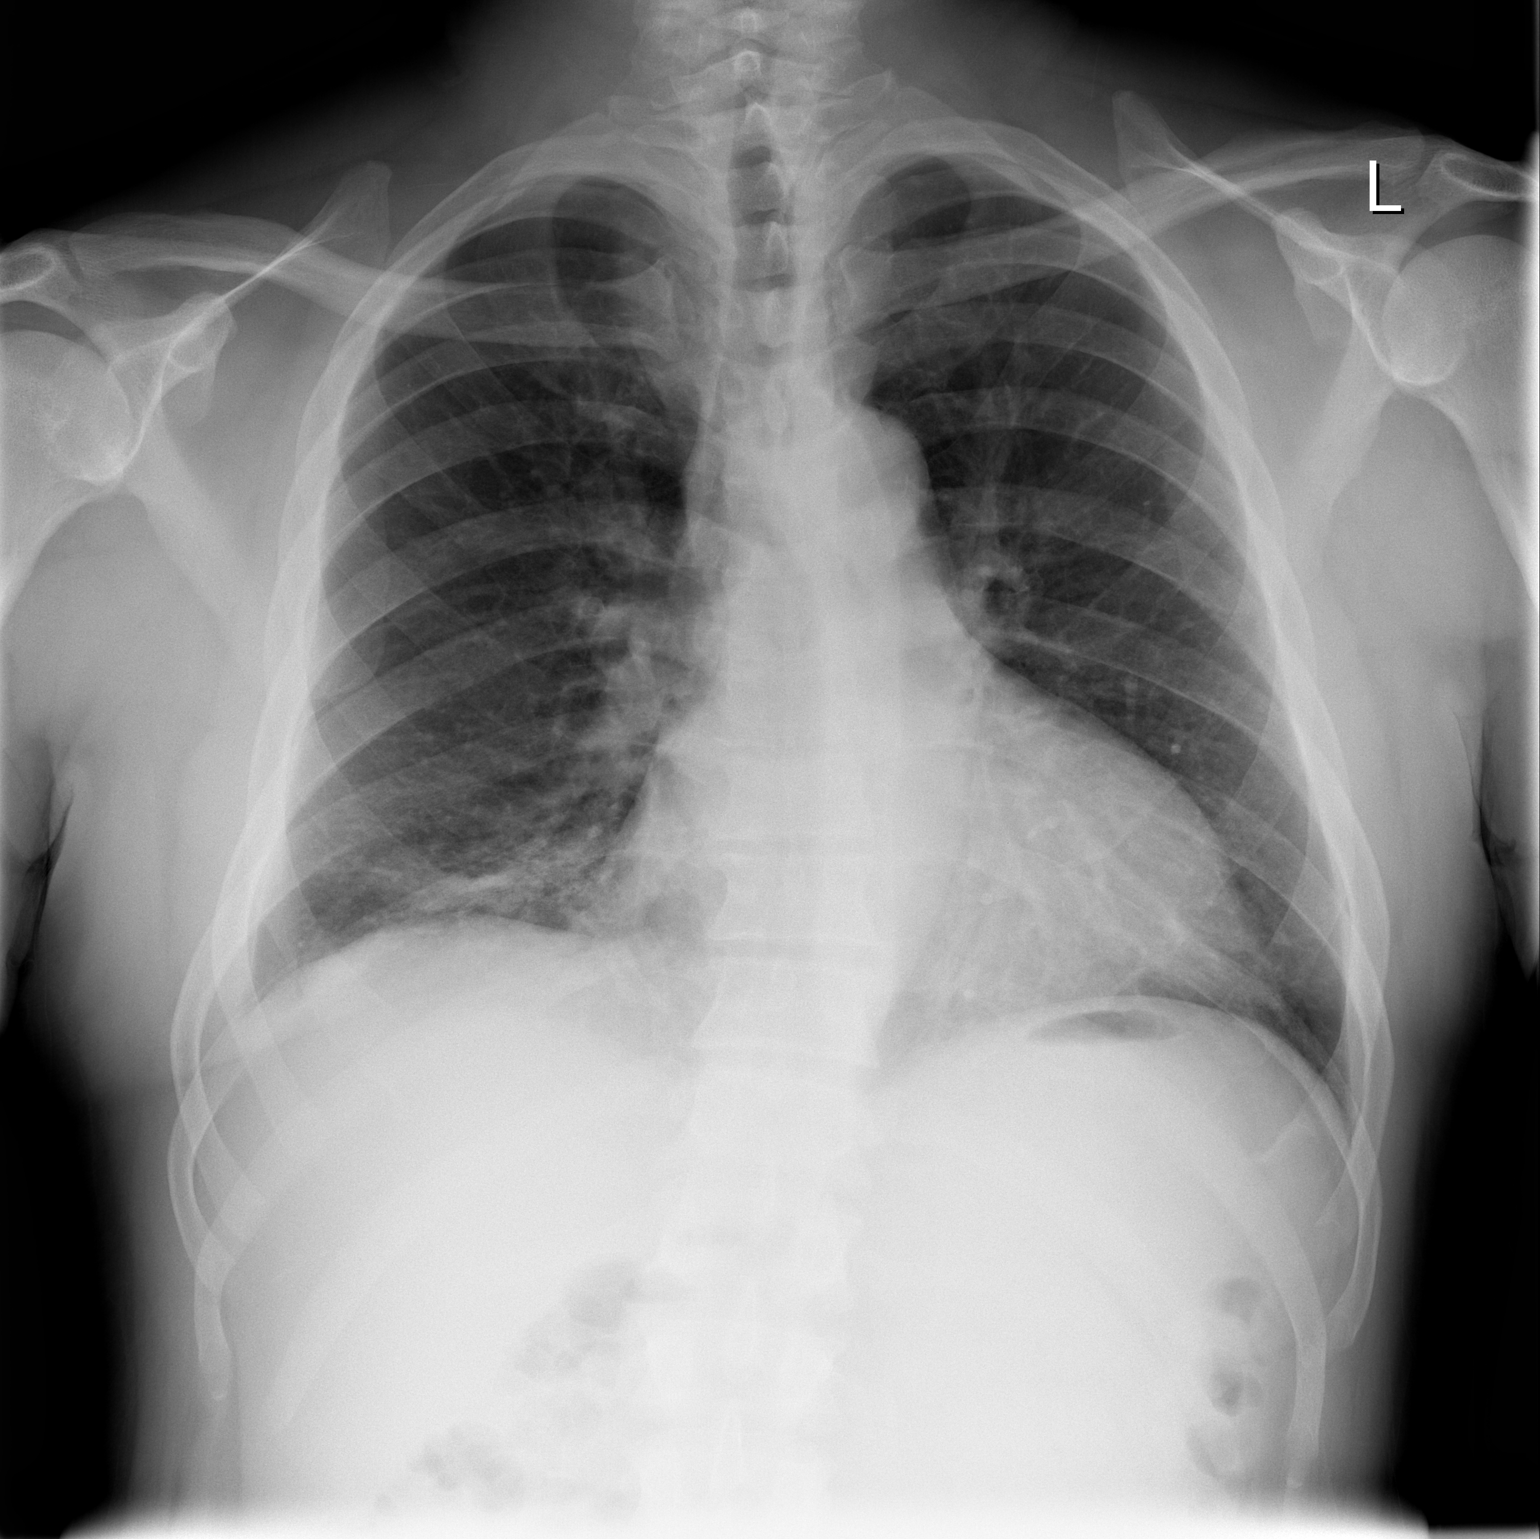

[w chest lat]
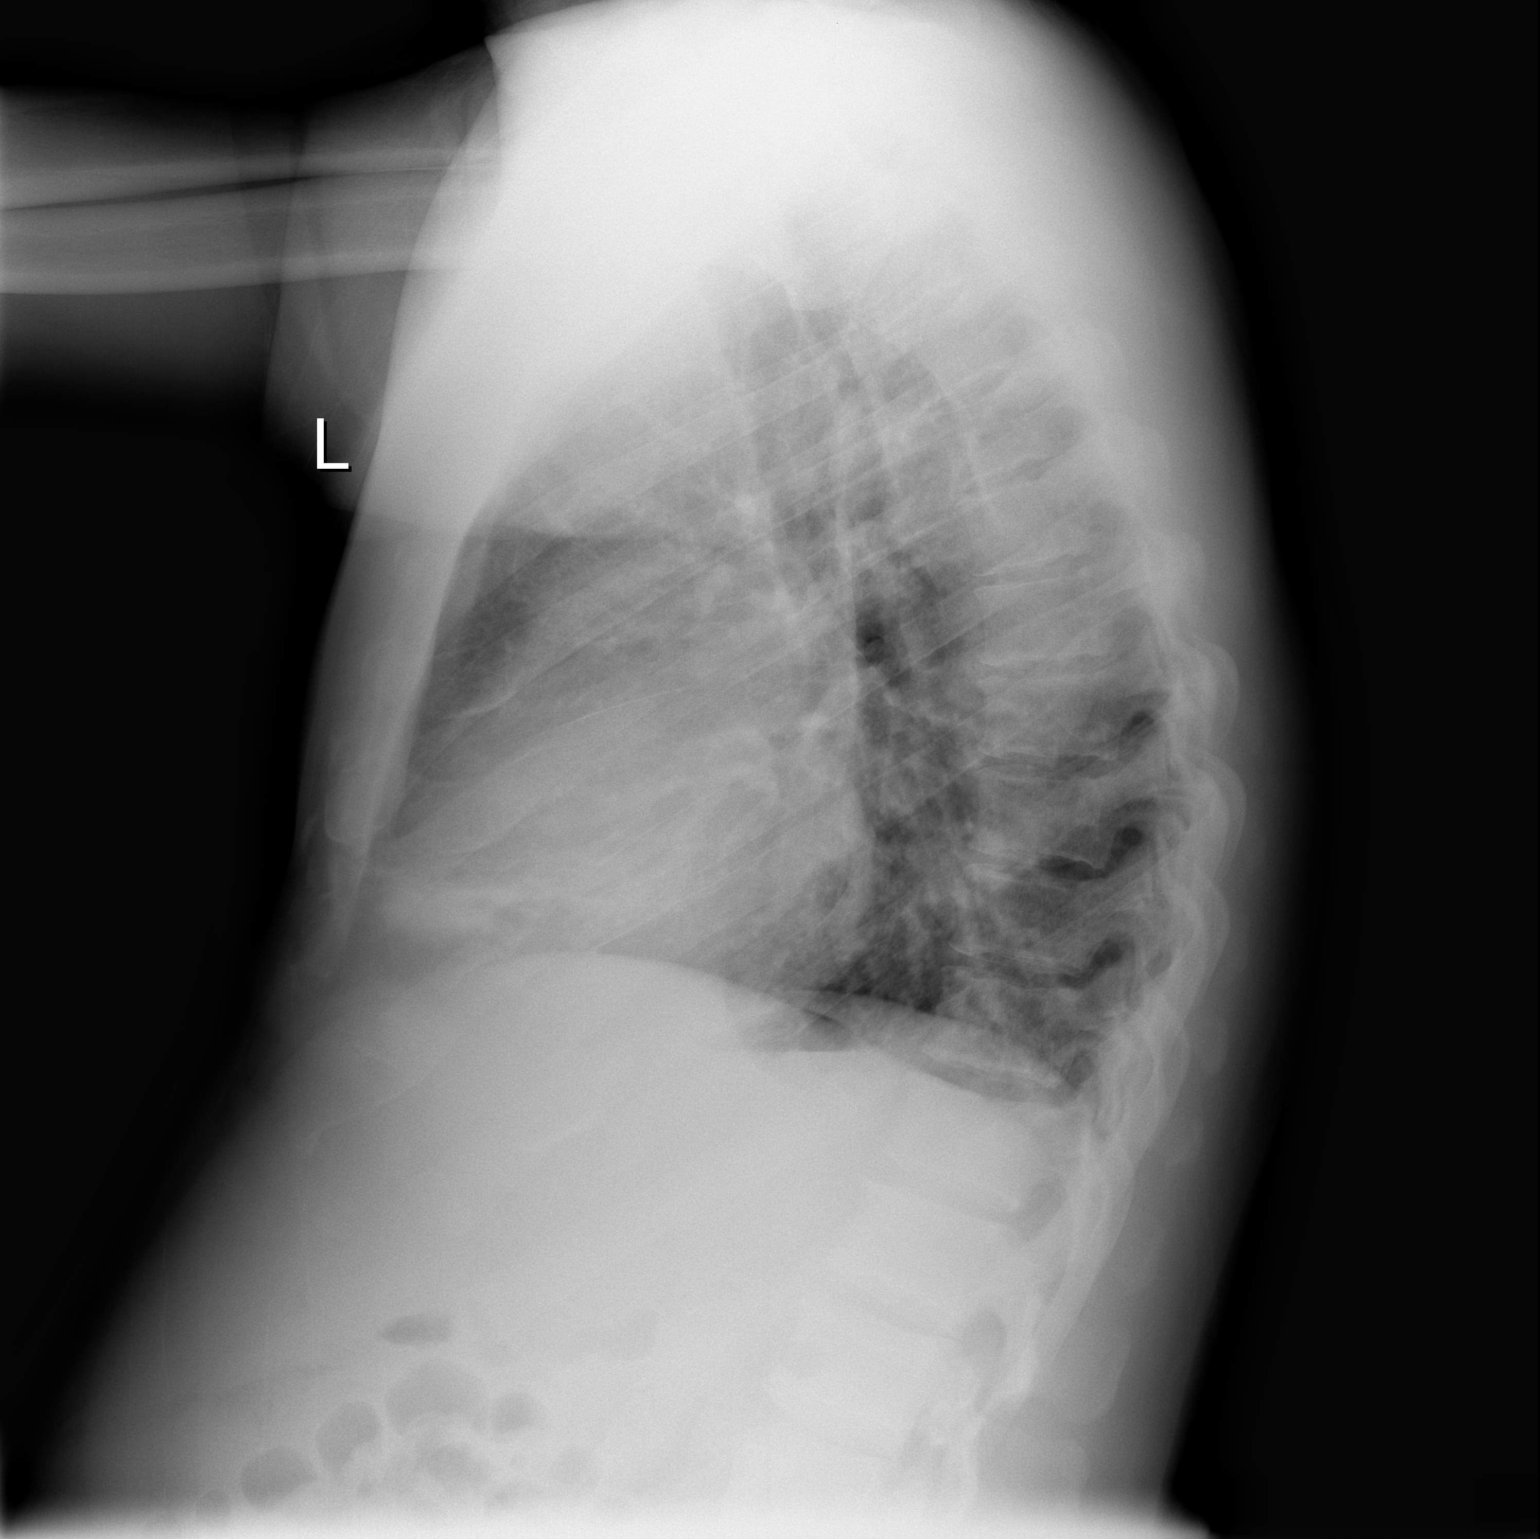

[2 of 2 positions shown; findings below may reference images not displayed]

FINDINGS: The consolidation in the right middle and right lower lobes has
improved significantly since the previous exam. There is only mild residual
opacity persisting in the right middle lobe. The lungs remain clear otherwise.
The heart size is upper normal to perhaps slightly enlarged but stable. The
thoracic aorta is mildly tortuous but unchanged. Mild degenerative changes are
again noted in the midthoracic spine.
IMPRESSION: Significant improvement in the pneumonia in the right middle and lower lobes.
Only mild residual airspace disease persists in the right middle lobe.
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
## 2008-05-15 ENCOUNTER — Emergency Department (HOSPITAL_COMMUNITY): Admission: EM | Admit: 2008-05-15 | Discharge: 2008-05-15 | Payer: Self-pay | Admitting: Emergency Medicine

## 2008-05-15 ENCOUNTER — Encounter: Payer: Self-pay | Admitting: Emergency Medicine

## 2008-05-16 ENCOUNTER — Ambulatory Visit: Payer: Self-pay | Admitting: Cardiovascular Disease

## 2008-05-16 ENCOUNTER — Encounter (INDEPENDENT_AMBULATORY_CARE_PROVIDER_SITE_OTHER): Payer: Self-pay | Admitting: Pulmonary Disease

## 2008-05-16 ENCOUNTER — Inpatient Hospital Stay (HOSPITAL_COMMUNITY): Admission: RE | Admit: 2008-05-16 | Discharge: 2008-05-20 | Payer: Self-pay | Admitting: Internal Medicine

## 2008-05-16 ENCOUNTER — Ambulatory Visit: Payer: Self-pay | Admitting: Internal Medicine

## 2008-05-18 ENCOUNTER — Encounter: Payer: Self-pay | Admitting: Internal Medicine

## 2008-05-30 ENCOUNTER — Emergency Department (HOSPITAL_COMMUNITY): Admission: EM | Admit: 2008-05-30 | Discharge: 2008-05-30 | Payer: Self-pay | Admitting: Emergency Medicine

## 2008-05-31 ENCOUNTER — Inpatient Hospital Stay (HOSPITAL_COMMUNITY): Admission: EM | Admit: 2008-05-31 | Discharge: 2008-06-01 | Payer: Self-pay | Admitting: Emergency Medicine

## 2008-06-01 ENCOUNTER — Encounter (INDEPENDENT_AMBULATORY_CARE_PROVIDER_SITE_OTHER): Payer: Self-pay | Admitting: Internal Medicine

## 2008-06-01 ENCOUNTER — Ambulatory Visit: Payer: Self-pay | Admitting: Vascular Surgery

## 2008-06-21 ENCOUNTER — Emergency Department (HOSPITAL_COMMUNITY): Admission: EM | Admit: 2008-06-21 | Discharge: 2008-06-22 | Payer: Self-pay | Admitting: Emergency Medicine

## 2008-06-26 ENCOUNTER — Other Ambulatory Visit: Payer: Self-pay | Admitting: Emergency Medicine

## 2008-06-27 ENCOUNTER — Ambulatory Visit: Payer: Self-pay | Admitting: Psychiatry

## 2008-06-27 ENCOUNTER — Inpatient Hospital Stay (HOSPITAL_COMMUNITY): Admission: RE | Admit: 2008-06-27 | Discharge: 2008-06-27 | Payer: Self-pay | Admitting: Psychiatry

## 2008-06-29 ENCOUNTER — Emergency Department (HOSPITAL_COMMUNITY): Admission: EM | Admit: 2008-06-29 | Discharge: 2008-06-29 | Payer: Self-pay | Admitting: Emergency Medicine

## 2008-07-02 ENCOUNTER — Emergency Department (HOSPITAL_COMMUNITY): Admission: EM | Admit: 2008-07-02 | Discharge: 2008-07-03 | Payer: Self-pay | Admitting: Emergency Medicine

## 2008-07-04 ENCOUNTER — Emergency Department (HOSPITAL_COMMUNITY): Admission: EM | Admit: 2008-07-04 | Discharge: 2008-07-04 | Payer: Self-pay | Admitting: Emergency Medicine

## 2008-07-08 ENCOUNTER — Emergency Department (HOSPITAL_BASED_OUTPATIENT_CLINIC_OR_DEPARTMENT_OTHER): Admission: EM | Admit: 2008-07-08 | Discharge: 2008-07-08 | Payer: Self-pay | Admitting: Emergency Medicine

## 2008-07-15 ENCOUNTER — Emergency Department (HOSPITAL_COMMUNITY): Admission: EM | Admit: 2008-07-15 | Discharge: 2008-07-16 | Payer: Self-pay | Admitting: Emergency Medicine

## 2008-07-17 ENCOUNTER — Emergency Department (HOSPITAL_BASED_OUTPATIENT_CLINIC_OR_DEPARTMENT_OTHER): Admission: EM | Admit: 2008-07-17 | Discharge: 2008-07-17 | Payer: Self-pay | Admitting: Emergency Medicine

## 2008-08-11 ENCOUNTER — Other Ambulatory Visit: Payer: Self-pay | Admitting: Emergency Medicine

## 2008-08-12 ENCOUNTER — Inpatient Hospital Stay (HOSPITAL_COMMUNITY): Admission: RE | Admit: 2008-08-12 | Discharge: 2008-08-14 | Payer: Self-pay | Admitting: Psychiatry

## 2008-08-12 ENCOUNTER — Ambulatory Visit: Payer: Self-pay | Admitting: Psychiatry

## 2008-08-16 ENCOUNTER — Other Ambulatory Visit: Payer: Self-pay

## 2008-08-17 ENCOUNTER — Inpatient Hospital Stay (HOSPITAL_COMMUNITY): Admission: RE | Admit: 2008-08-17 | Discharge: 2008-08-23 | Payer: Self-pay | Admitting: Psychiatry

## 2008-08-24 ENCOUNTER — Emergency Department (HOSPITAL_COMMUNITY): Admission: EM | Admit: 2008-08-24 | Discharge: 2008-08-24 | Payer: Self-pay | Admitting: Emergency Medicine

## 2008-08-26 ENCOUNTER — Emergency Department (HOSPITAL_COMMUNITY): Admission: EM | Admit: 2008-08-26 | Discharge: 2008-08-26 | Payer: Self-pay | Admitting: Emergency Medicine

## 2008-08-30 ENCOUNTER — Emergency Department (HOSPITAL_COMMUNITY): Admission: EM | Admit: 2008-08-30 | Discharge: 2008-08-31 | Payer: Self-pay | Admitting: Emergency Medicine

## 2008-08-30 ENCOUNTER — Emergency Department (HOSPITAL_COMMUNITY): Admission: EM | Admit: 2008-08-30 | Discharge: 2008-08-30 | Payer: Self-pay | Admitting: Emergency Medicine

## 2008-09-07 ENCOUNTER — Other Ambulatory Visit: Payer: Self-pay | Admitting: Emergency Medicine

## 2008-09-08 ENCOUNTER — Inpatient Hospital Stay (HOSPITAL_COMMUNITY): Admission: RE | Admit: 2008-09-08 | Discharge: 2008-09-12 | Payer: Self-pay | Admitting: Psychiatry

## 2008-09-14 ENCOUNTER — Emergency Department (HOSPITAL_COMMUNITY): Admission: EM | Admit: 2008-09-14 | Discharge: 2008-09-19 | Payer: Self-pay | Admitting: Emergency Medicine

## 2008-09-17 ENCOUNTER — Ambulatory Visit: Payer: Self-pay | Admitting: Psychiatry

## 2008-09-23 ENCOUNTER — Emergency Department (HOSPITAL_COMMUNITY): Admission: EM | Admit: 2008-09-23 | Discharge: 2008-09-23 | Payer: Self-pay | Admitting: Emergency Medicine

## 2008-09-24 ENCOUNTER — Emergency Department (HOSPITAL_COMMUNITY): Admission: EM | Admit: 2008-09-24 | Discharge: 2008-09-24 | Payer: Self-pay | Admitting: Emergency Medicine

## 2008-10-11 ENCOUNTER — Emergency Department (HOSPITAL_COMMUNITY): Admission: EM | Admit: 2008-10-11 | Discharge: 2008-10-11 | Payer: Self-pay | Admitting: Emergency Medicine

## 2008-10-13 ENCOUNTER — Emergency Department (HOSPITAL_COMMUNITY): Admission: EM | Admit: 2008-10-13 | Discharge: 2008-10-13 | Payer: Self-pay | Admitting: Emergency Medicine

## 2008-10-15 ENCOUNTER — Emergency Department (HOSPITAL_COMMUNITY): Admission: EM | Admit: 2008-10-15 | Discharge: 2008-10-16 | Payer: Self-pay | Admitting: Emergency Medicine

## 2008-10-15 ENCOUNTER — Emergency Department (HOSPITAL_COMMUNITY): Admission: EM | Admit: 2008-10-15 | Discharge: 2008-10-15 | Payer: Self-pay | Admitting: Emergency Medicine

## 2008-10-17 ENCOUNTER — Emergency Department (HOSPITAL_COMMUNITY): Admission: EM | Admit: 2008-10-17 | Discharge: 2008-10-17 | Payer: Self-pay | Admitting: Emergency Medicine

## 2008-10-18 ENCOUNTER — Emergency Department (HOSPITAL_COMMUNITY): Admission: EM | Admit: 2008-10-18 | Discharge: 2008-10-18 | Payer: Self-pay | Admitting: Emergency Medicine

## 2008-10-21 ENCOUNTER — Emergency Department (HOSPITAL_COMMUNITY): Admission: EM | Admit: 2008-10-21 | Discharge: 2008-10-22 | Payer: Self-pay | Admitting: Emergency Medicine

## 2008-10-23 ENCOUNTER — Emergency Department (HOSPITAL_COMMUNITY): Admission: EM | Admit: 2008-10-23 | Discharge: 2008-10-23 | Payer: Self-pay | Admitting: Emergency Medicine

## 2008-10-24 ENCOUNTER — Emergency Department (HOSPITAL_COMMUNITY): Admission: EM | Admit: 2008-10-24 | Discharge: 2008-10-25 | Payer: Self-pay | Admitting: Emergency Medicine

## 2008-10-24 ENCOUNTER — Emergency Department (HOSPITAL_COMMUNITY): Admission: EM | Admit: 2008-10-24 | Discharge: 2008-10-24 | Payer: Self-pay | Admitting: Emergency Medicine

## 2008-10-30 ENCOUNTER — Emergency Department (HOSPITAL_COMMUNITY): Admission: EM | Admit: 2008-10-30 | Discharge: 2008-10-31 | Payer: Self-pay | Admitting: Emergency Medicine

## 2008-11-20 ENCOUNTER — Emergency Department (HOSPITAL_COMMUNITY): Admission: EM | Admit: 2008-11-20 | Discharge: 2008-11-20 | Payer: Self-pay | Admitting: Emergency Medicine

## 2008-11-23 ENCOUNTER — Emergency Department (HOSPITAL_COMMUNITY): Admission: EM | Admit: 2008-11-23 | Discharge: 2008-11-23 | Payer: Self-pay | Admitting: Emergency Medicine

## 2008-11-26 ENCOUNTER — Emergency Department (HOSPITAL_COMMUNITY): Admission: EM | Admit: 2008-11-26 | Discharge: 2008-11-27 | Payer: Self-pay | Admitting: Emergency Medicine

## 2008-11-26 IMAGING — CR DG CHEST 2V
2 series · 2 of 2 positions shown · non-contrast
Comparison: 06/15/2005

CLINICAL DATA: Shortness of breath.  Cough.

CHEST - 2 VIEW

[w chest pa]
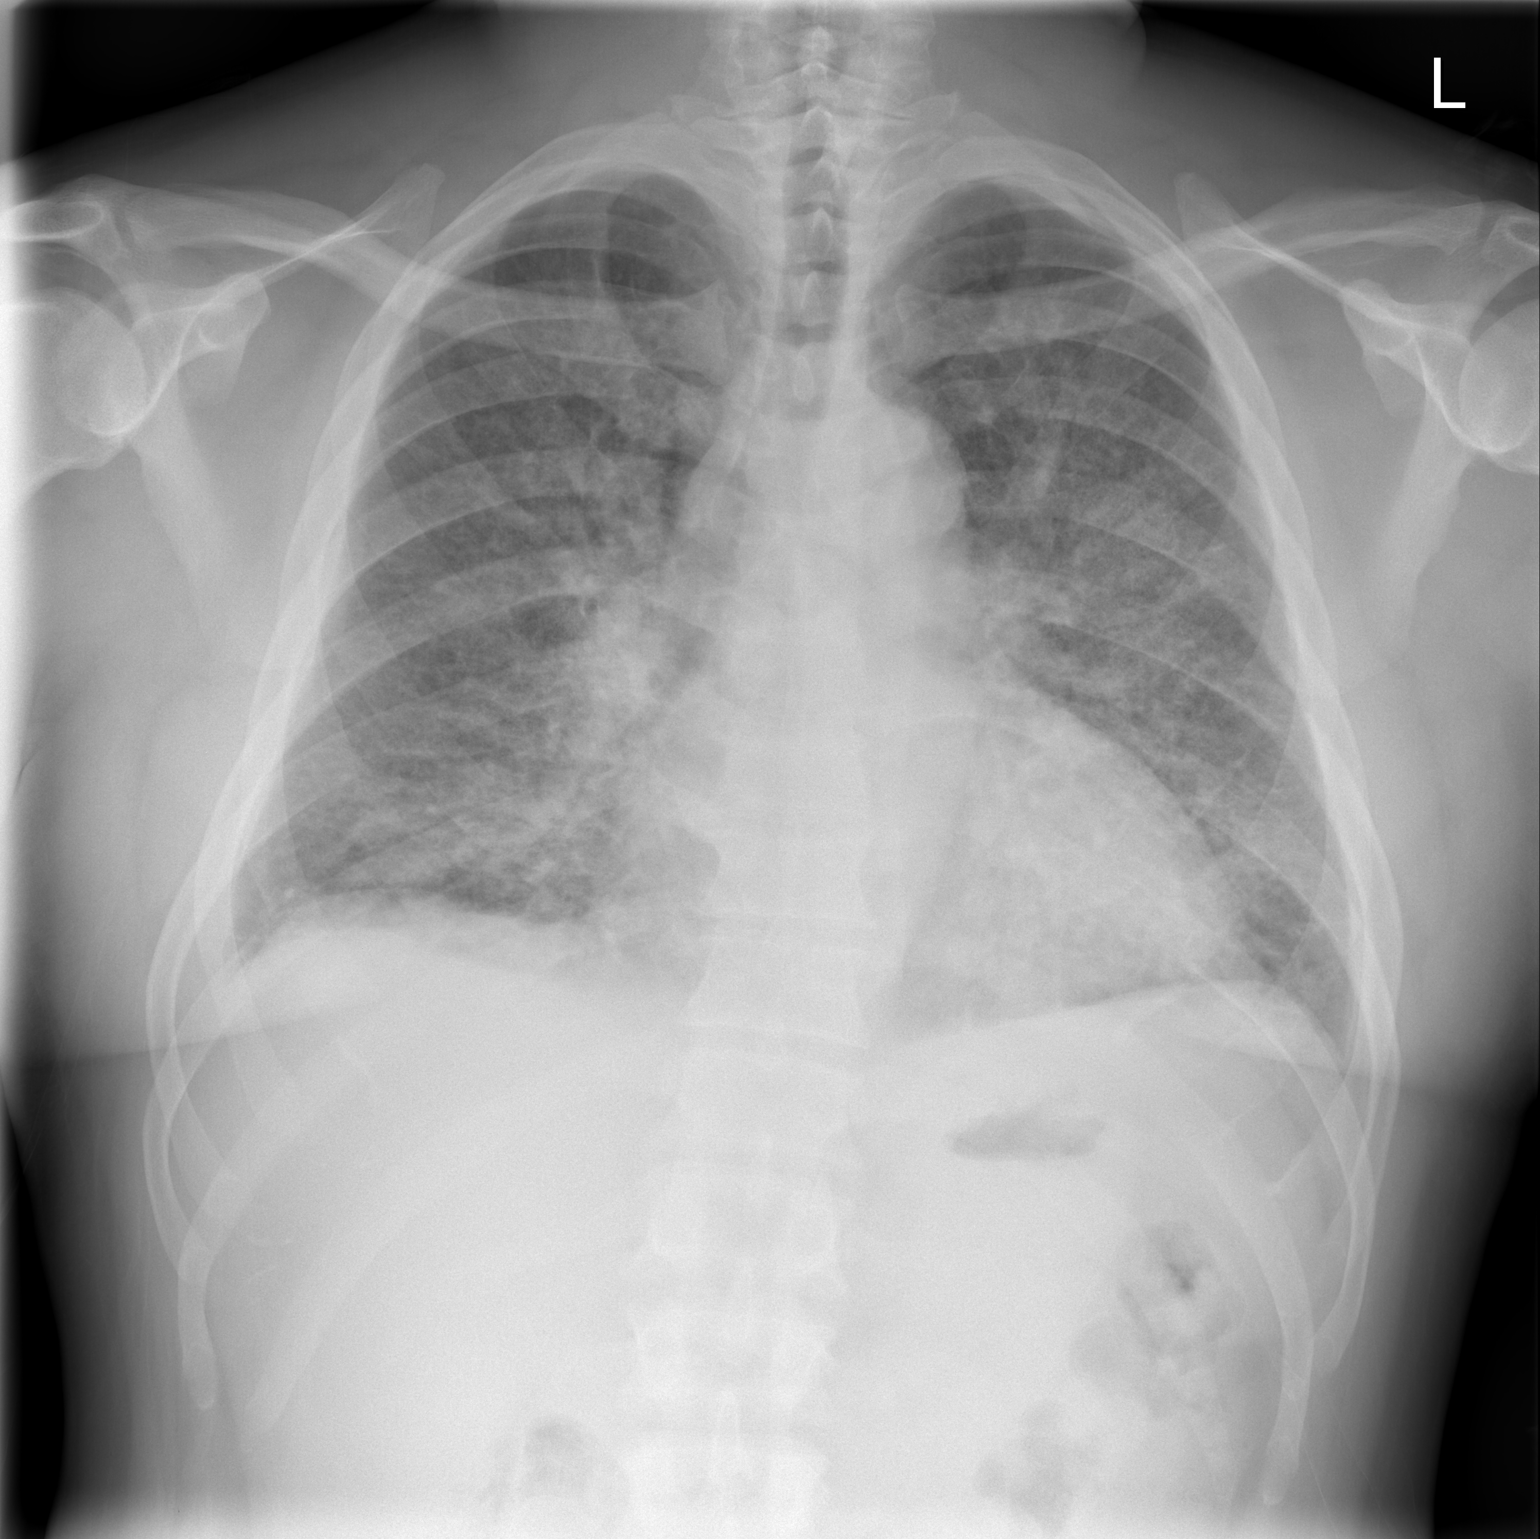

[w chest lat]
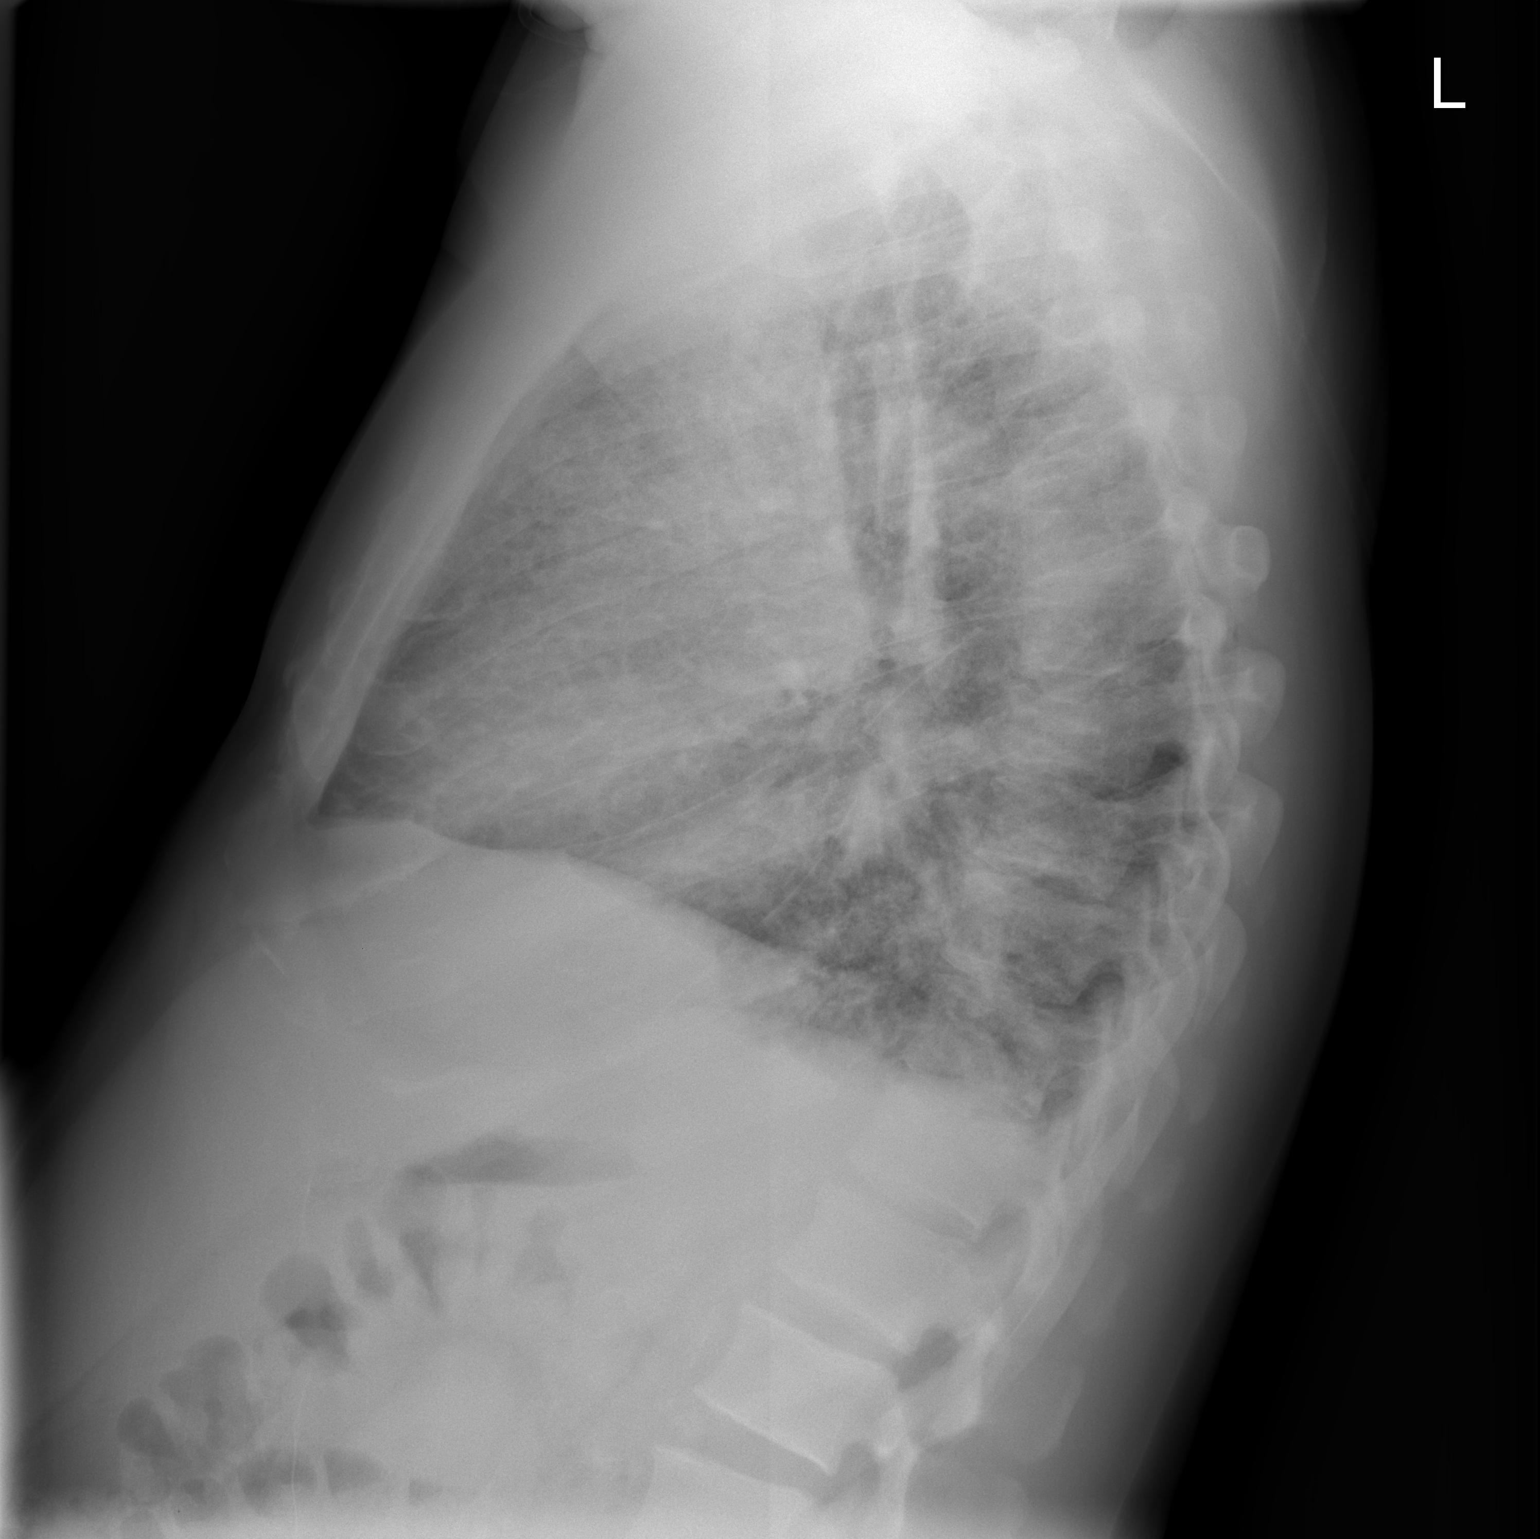

[2 of 2 positions shown; findings below may reference images not displayed]

FINDINGS: Heart size is enlarged.

There are small bilateral pleural effusions.

 Diffuse interstitial and airspace densities are identified
bilaterally.
IMPRESSION: 1.  Suspect moderate congestive heart failure.  Differential
considerations include diffuse multifocal pneumonia.

## 2008-11-26 IMAGING — CR DG CHEST 1V PORT
1 series · 1 of 1 positions shown · non-contrast
Comparison: 05/15/2008 at [DATE] a.m.

CLINICAL DATA: Shortness of breath, cough

PORTABLE CHEST - 1 VIEW

[view not recorded]
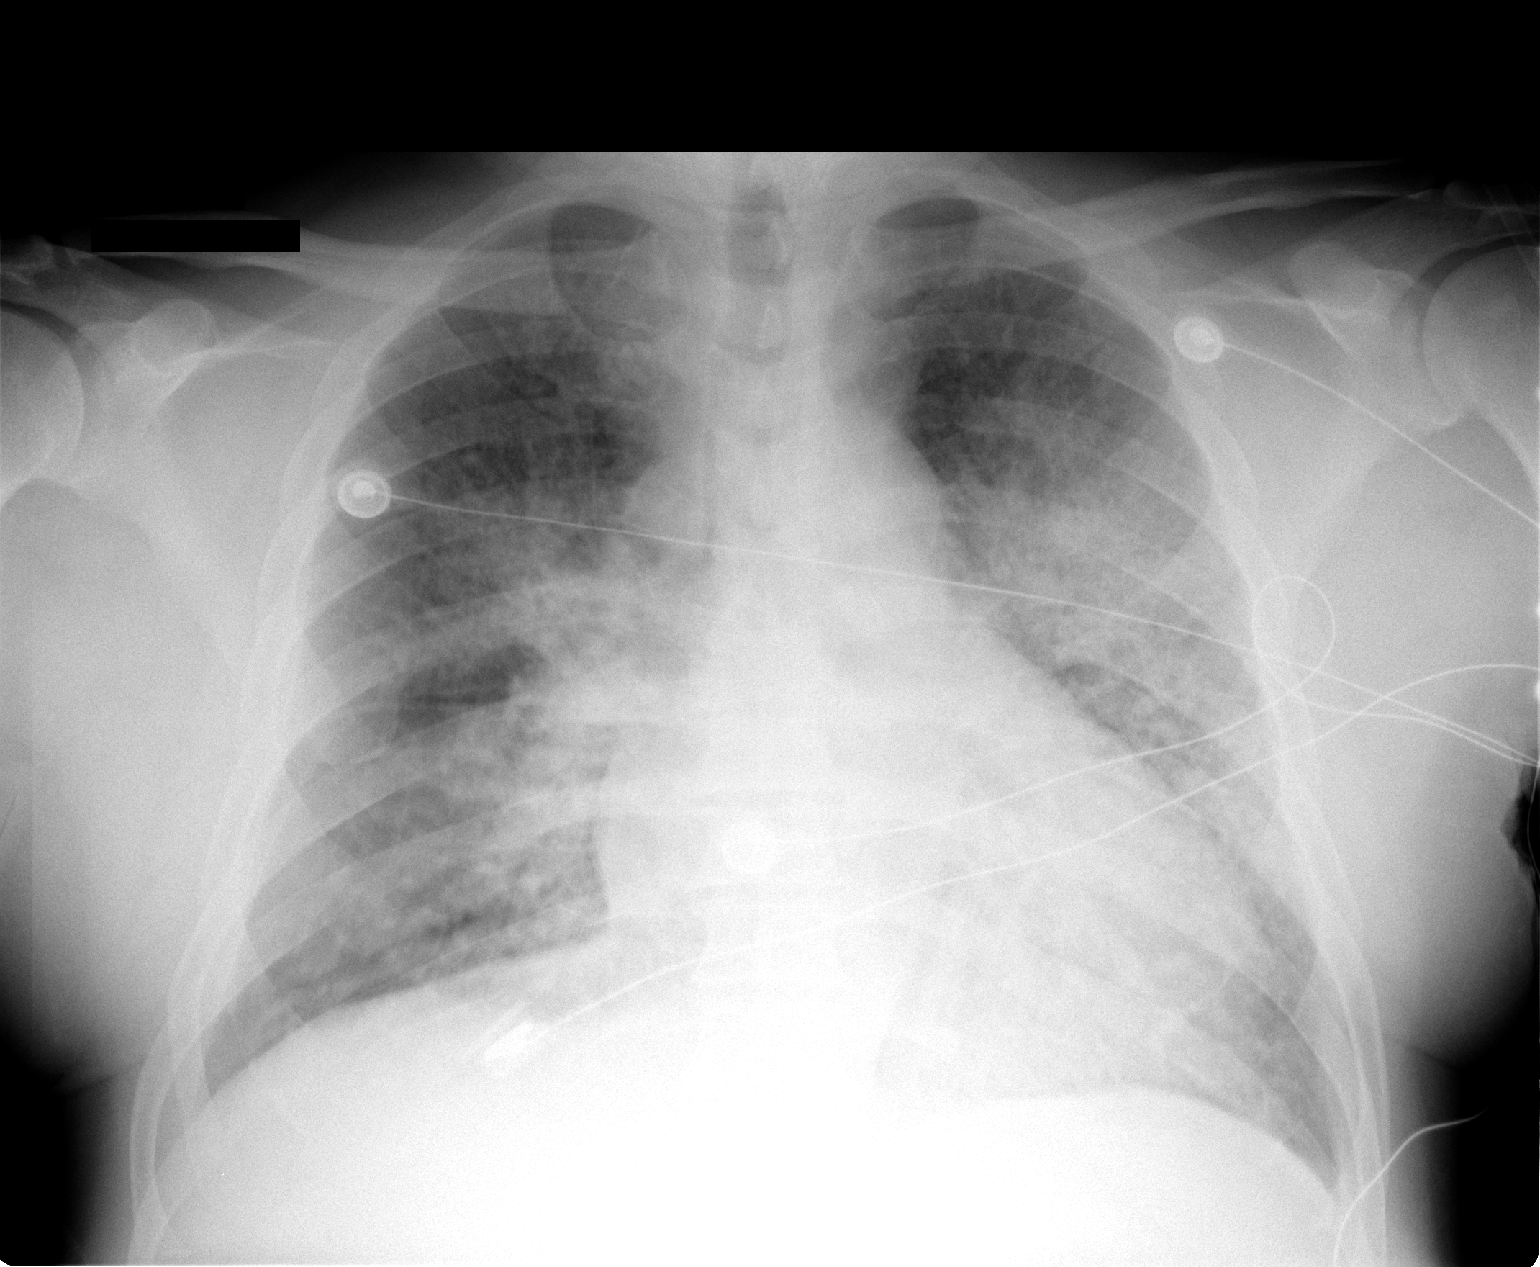

[1 of 1 positions shown; findings below may reference images not displayed]

FINDINGS: Slightly worsened multifocal predominately perihilar
airspace opacities are again noted.  Mild cardiomegaly persists.
Small bilateral pleural effusions noted.
IMPRESSION: Worsened multilobar perihilar airspace disease, most likely
pulmonary edema.  Multifocal infection or hemorrhage would be less
likely but possible.

## 2008-11-27 IMAGING — CT CT ANGIO CHEST
2 of 6 series · 18 of 36 positions shown · IV contrast (80 ml omni 300)
Comparison: 04/01/2005.

CLINICAL DATA: Shortness of breath and fever.  Elevated D-dimer.
Pulmonary infiltrates.

CT ANGIOGRAPHY CHEST
TECHNIQUE: Multidetector CT imaging of the chest using the
standard protocol during bolus administration of intravenous
contrast. Multiplanar reconstructed images including MIPs were
obtained and reviewed to evaluate the vascular anatomy.
Contrast: 180 ml Tmnipaque-FAA

[Series 2: pe · axial · 0.70mm/px · z∈[-276,-56]mm · 17 of 199 slices shown]
[im 12/199  lung]
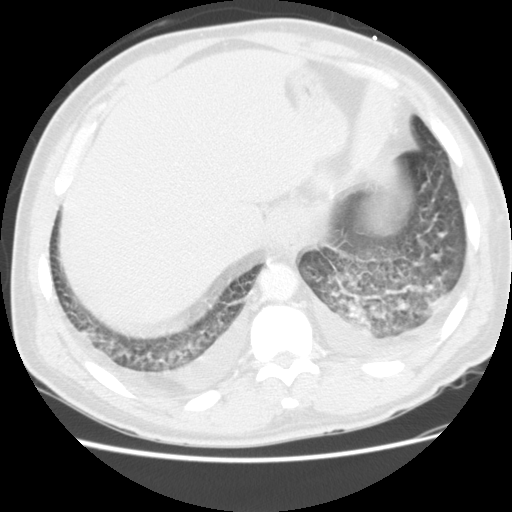
[im 23/199  mediastinal]
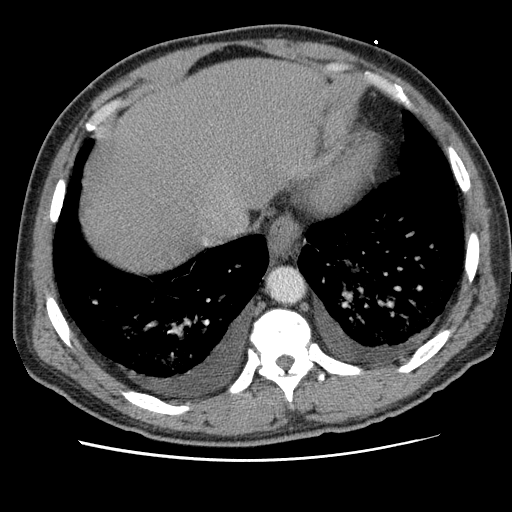
[im 34/199  lung]
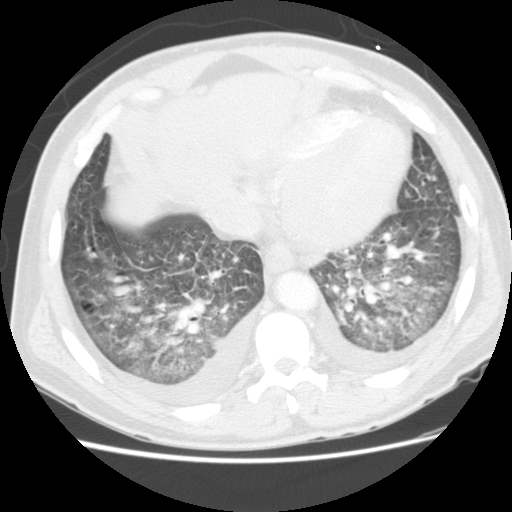
[im 45/199  mediastinal]
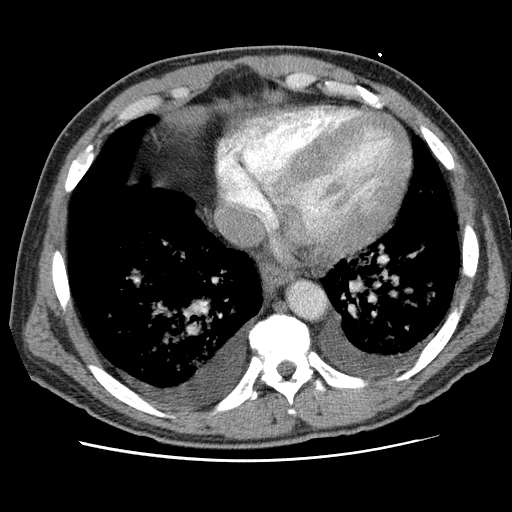
[im 56/199  lung]
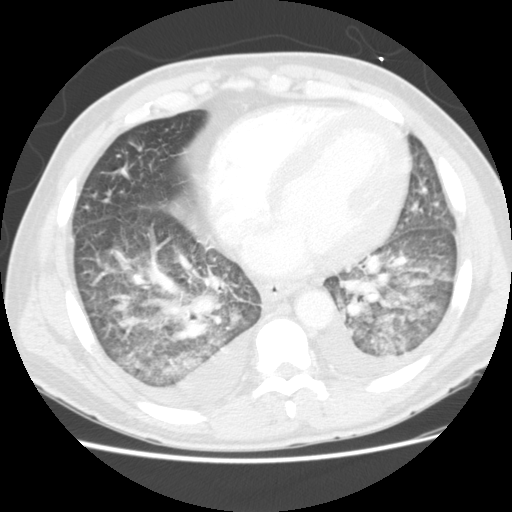
[im 67/199  mediastinal]
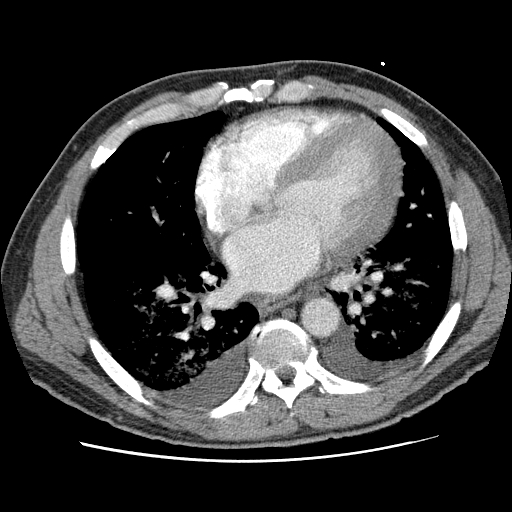
[im 78/199  lung]
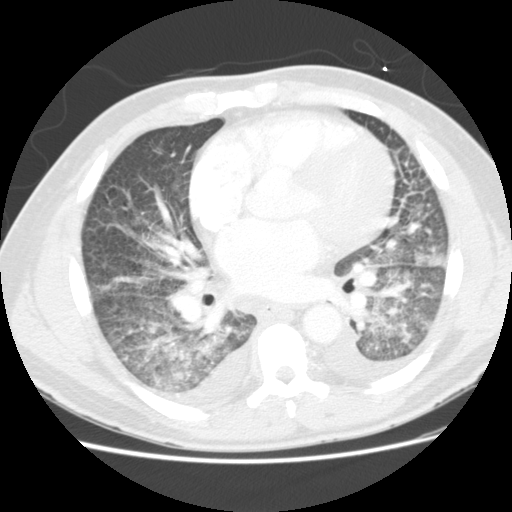
[im 89/199  mediastinal]
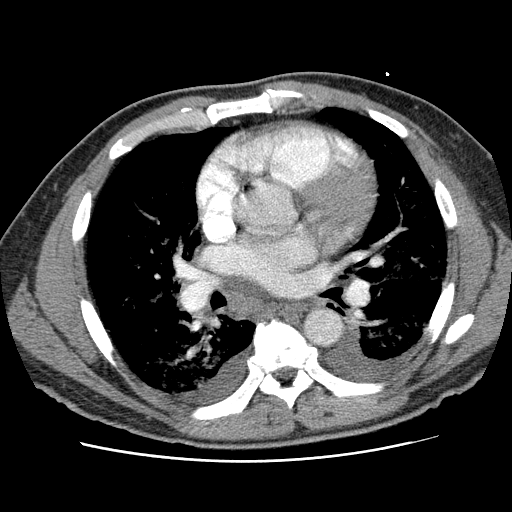
[im 100/199  lung]
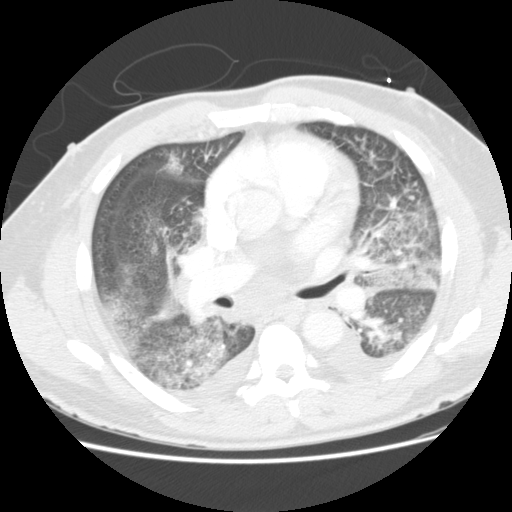
[im 111/199  mediastinal]
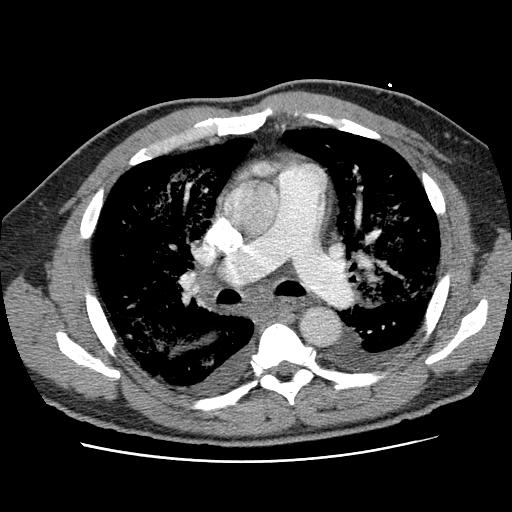
[im 122/199  lung]
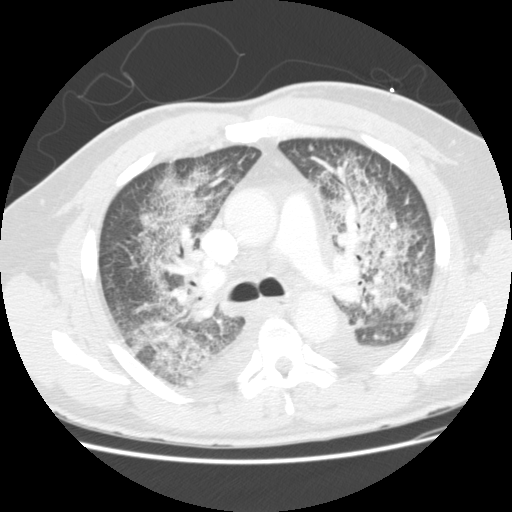
[im 133/199  mediastinal]
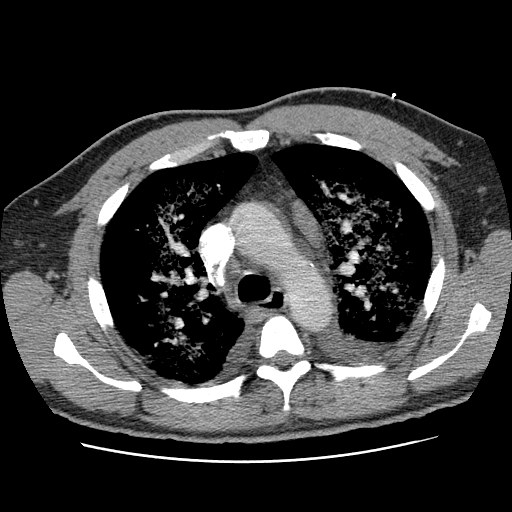
[im 144/199  lung]
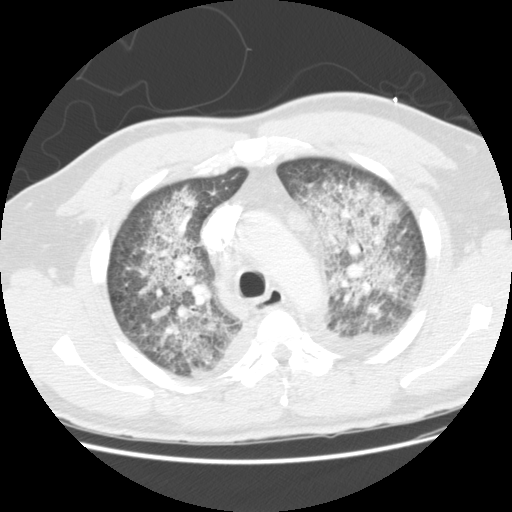
[im 155/199  mediastinal]
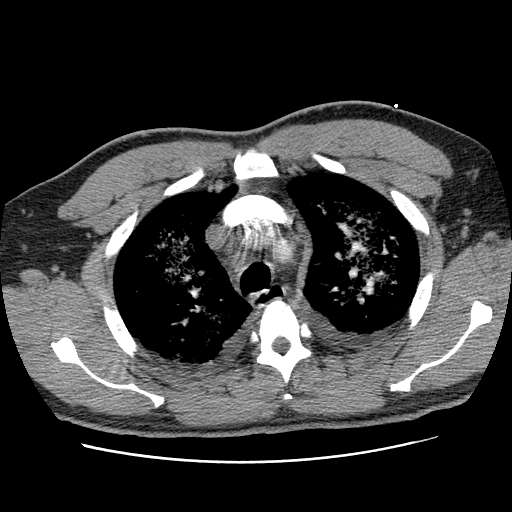
[im 166/199  lung]
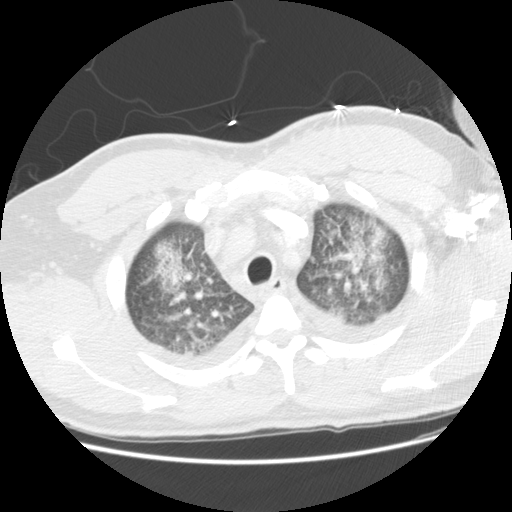
[im 177/199  mediastinal]
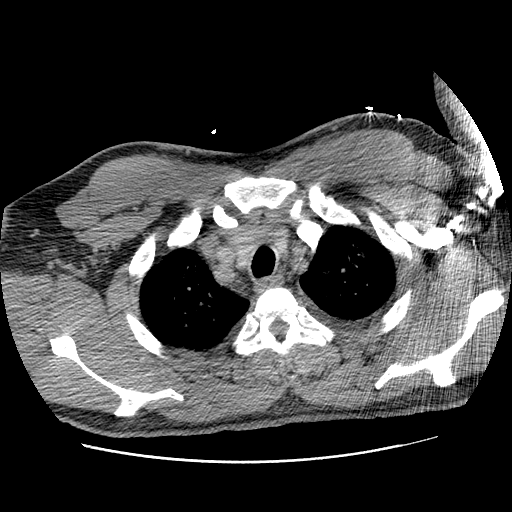
[im 188/199  lung]
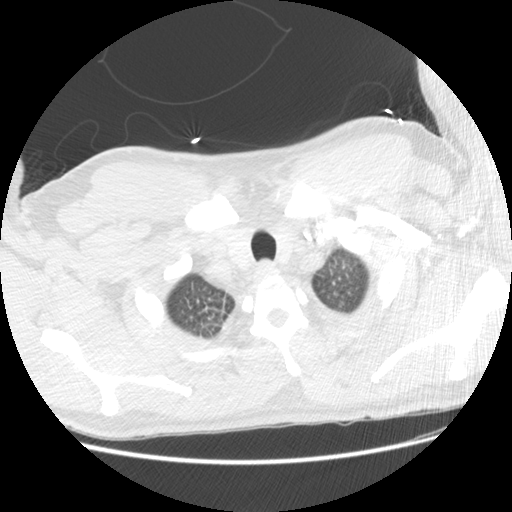

[Series 201: cor #2 · coronal · 0.70mm/px · 1 of 76 slices shown]
[im 38/76  mediastinal]
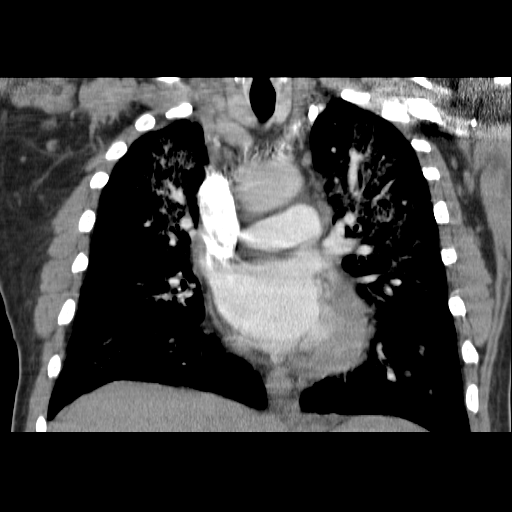

[18 of 36 positions shown; findings below may reference images not displayed]

FINDINGS: There are no pulmonary emboli.  There are bilateral
perihilar infiltrates extending into the upper and lower lobes.
There are small bilateral pleural effusions.  There is borderline
cardiomegaly.  The patient has developed mediastinal adenopathy
since 04/01/2005.

No bony abnormalities.
IMPRESSION: No evidence of pulmonary embolus.  Bilateral perihilar infiltrates
are nonspecific.  These could be inflammatory or due to pulmonary
edema.  However, the presence of the mediastinal adenopathy
suggests  that this might be inflammatory.  Small bilateral
effusions.

## 2008-11-28 IMAGING — CR DG CHEST 1V PORT
1 series · 1 of 1 positions shown · non-contrast
Comparison: [HOSPITAL] chest x-ray 05/15/2008 and [facility]CT angio chest 05/16/2008.

CLINICAL DATA: Hypoxia, pneumonia.

PORTABLE CHEST - 1 VIEW

[AP]
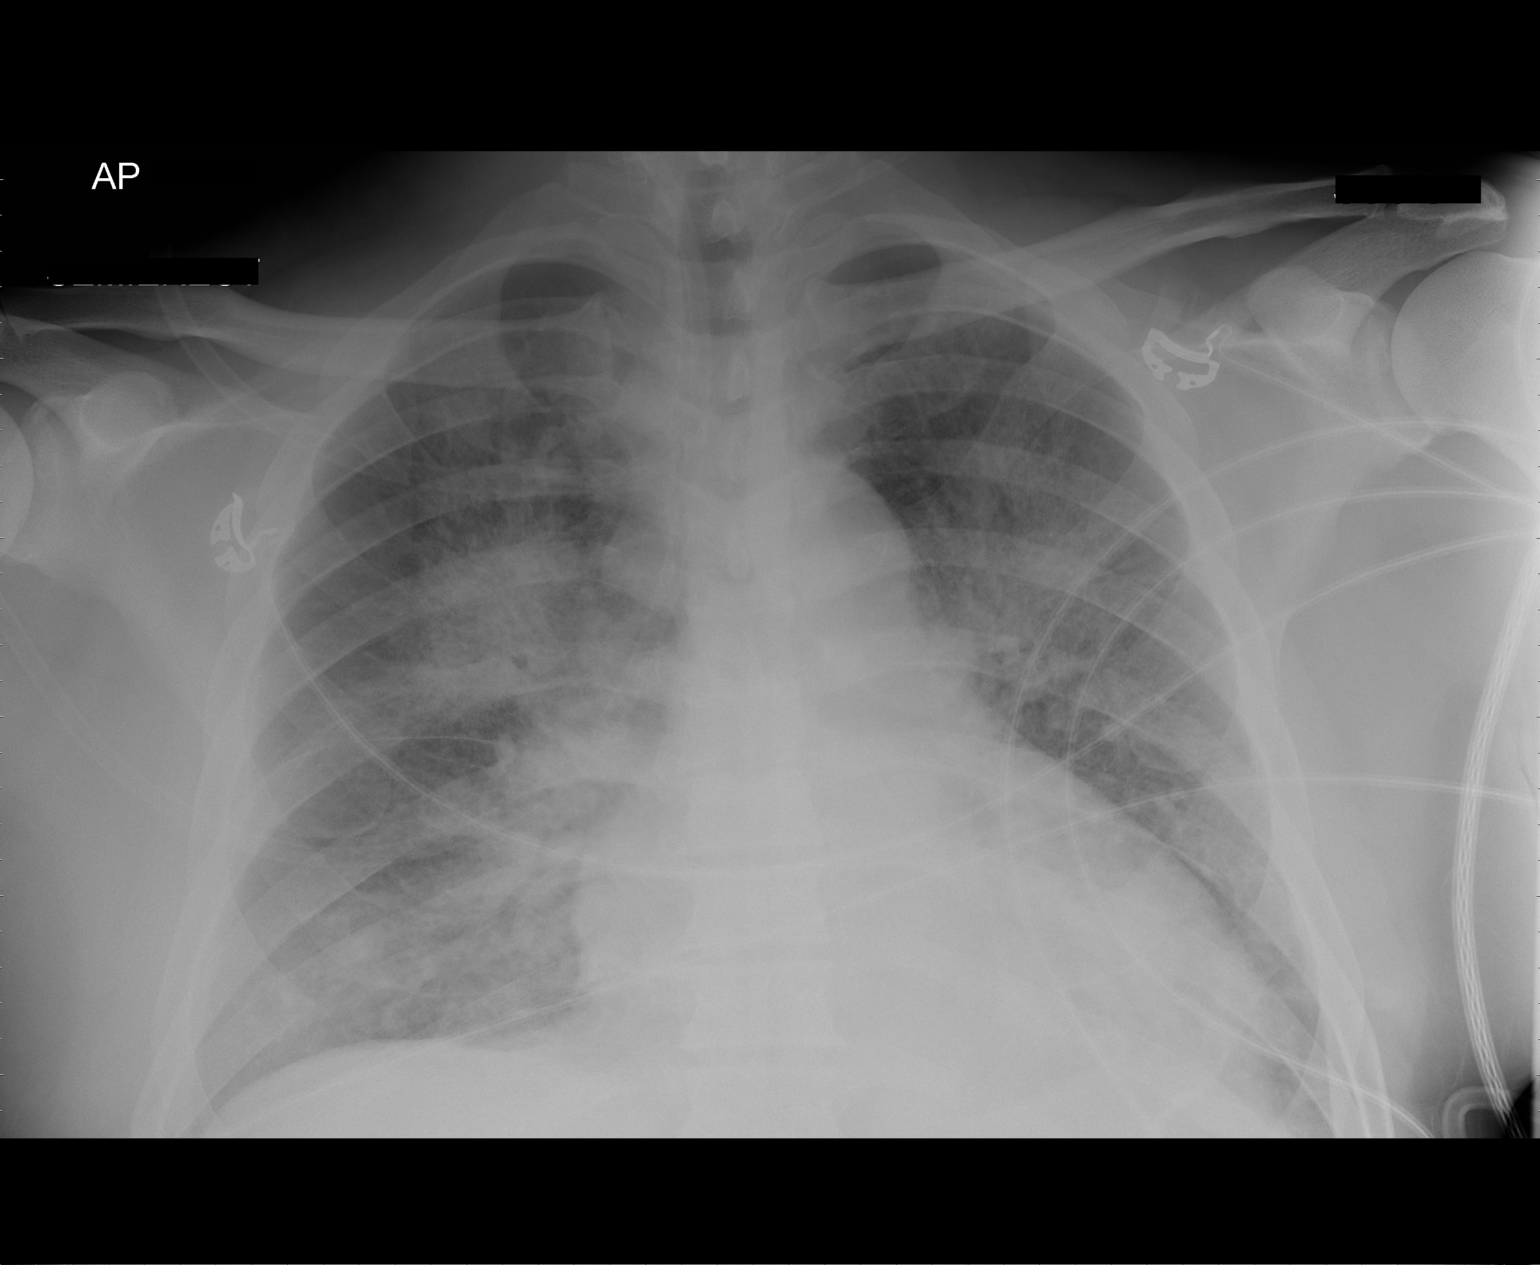

[1 of 1 positions shown; findings below may reference images not displayed]

FINDINGS: Since CT angio chest 05/16/2008 no significant change in
nonspecific diffuse airspace opacities throughout lungs consistent
with diffuse pulmonary edema and/or pneumonia.  Stable cardiomegaly
noted.  No new pleural fluid or pneumothorax seen.
IMPRESSION: 1.  Since 05/16/2008 stable diffuse long airspace opacities
consistent with diffuse pulmonary edema and/or pneumonia.
2.  Stable slight cardiomegaly.
3.  No new significant abnormality.

## 2008-11-29 IMAGING — CR DG CHEST 1V PORT
1 series · 1 of 1 positions shown · non-contrast
Comparison: 05/17/2008

CLINICAL DATA: Pneumonia.  Hypoxia.

PORTABLE CHEST - 1 VIEW

[AP]
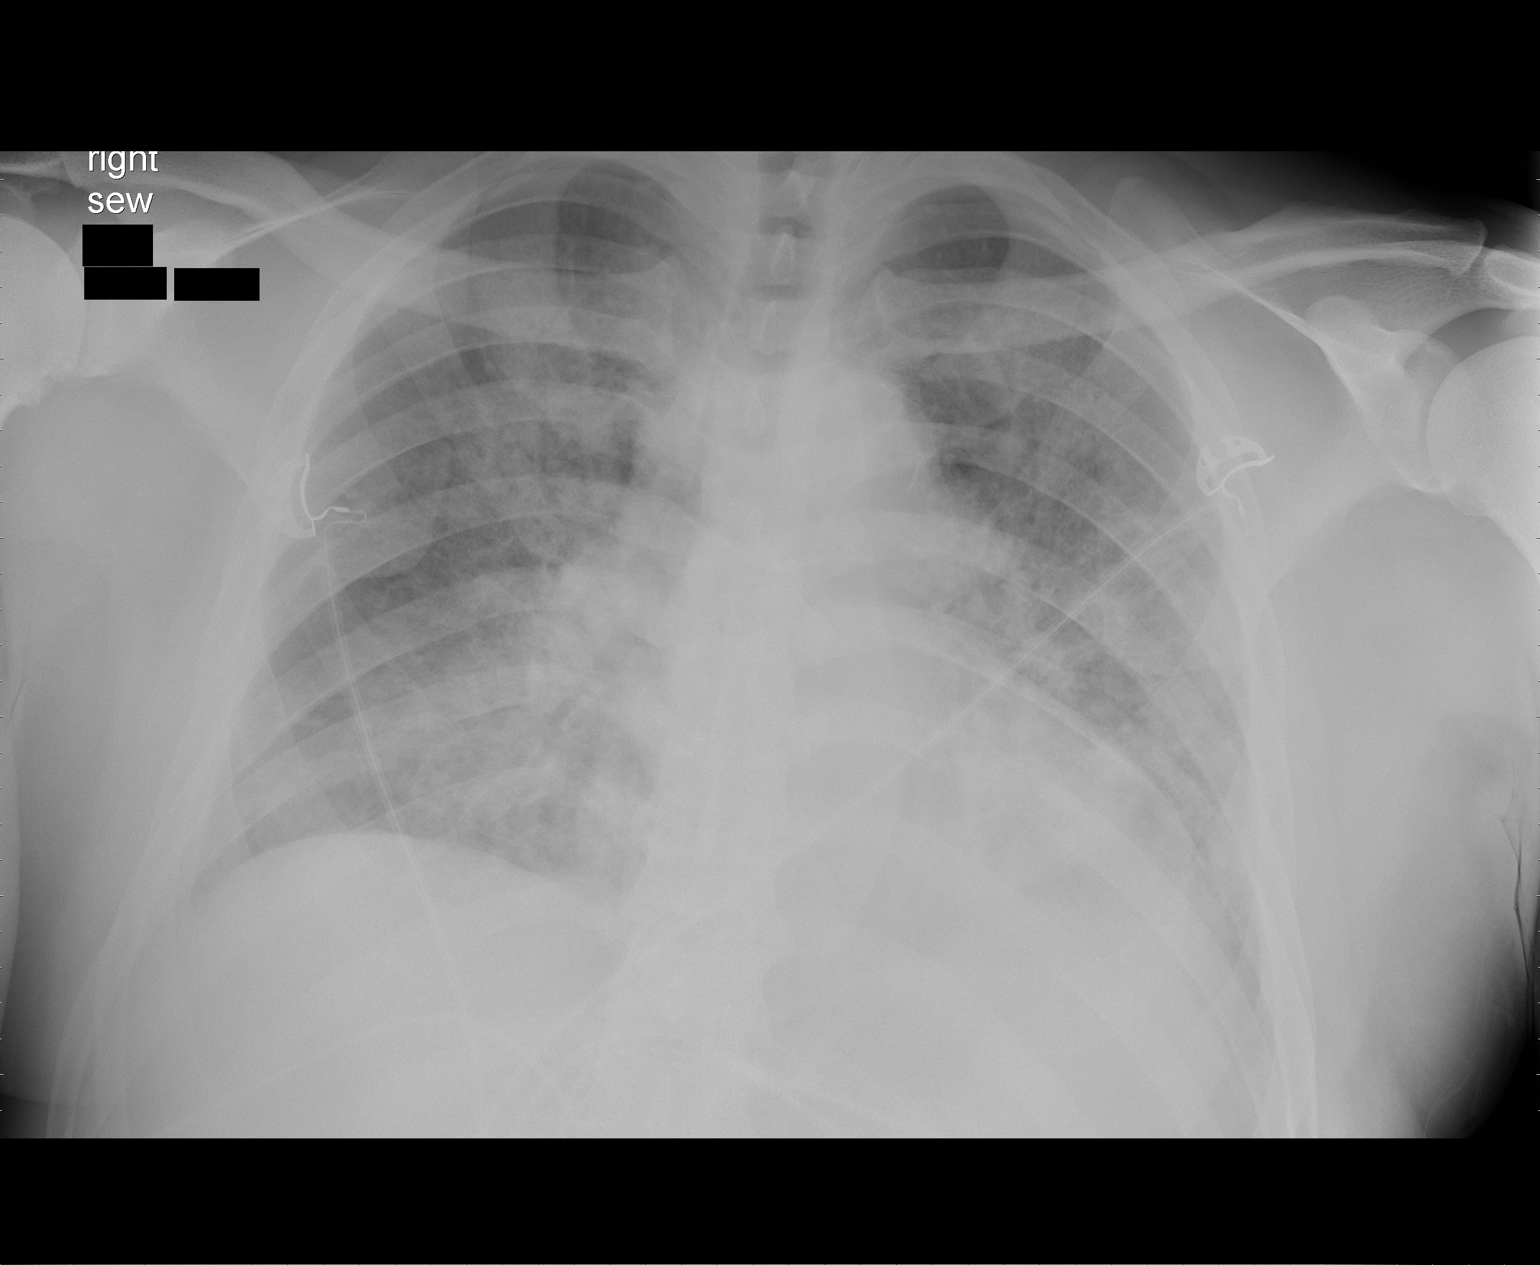

[1 of 1 positions shown; findings below may reference images not displayed]

FINDINGS: Diffuse pulmonary airspace disease. It is probably about
the same in degree. The light technique accentuates the densities.
IMPRESSION: Bilateral airspace disease is again noted.

## 2008-12-12 IMAGING — CR DG ABDOMEN ACUTE W/ 1V CHEST
3 series · 3 of 3 positions shown · non-contrast
Comparison: Acute abdomen series 01/26/2004.

CLINICAL DATA: Shortness of breath.  Abdominal distention.  Lower
extremity edema.

ACUTE ABDOMEN SERIES (ABDOMEN 2 VIEW & CHEST 1 VIEW) 05/31/2008:

[w chest pa]
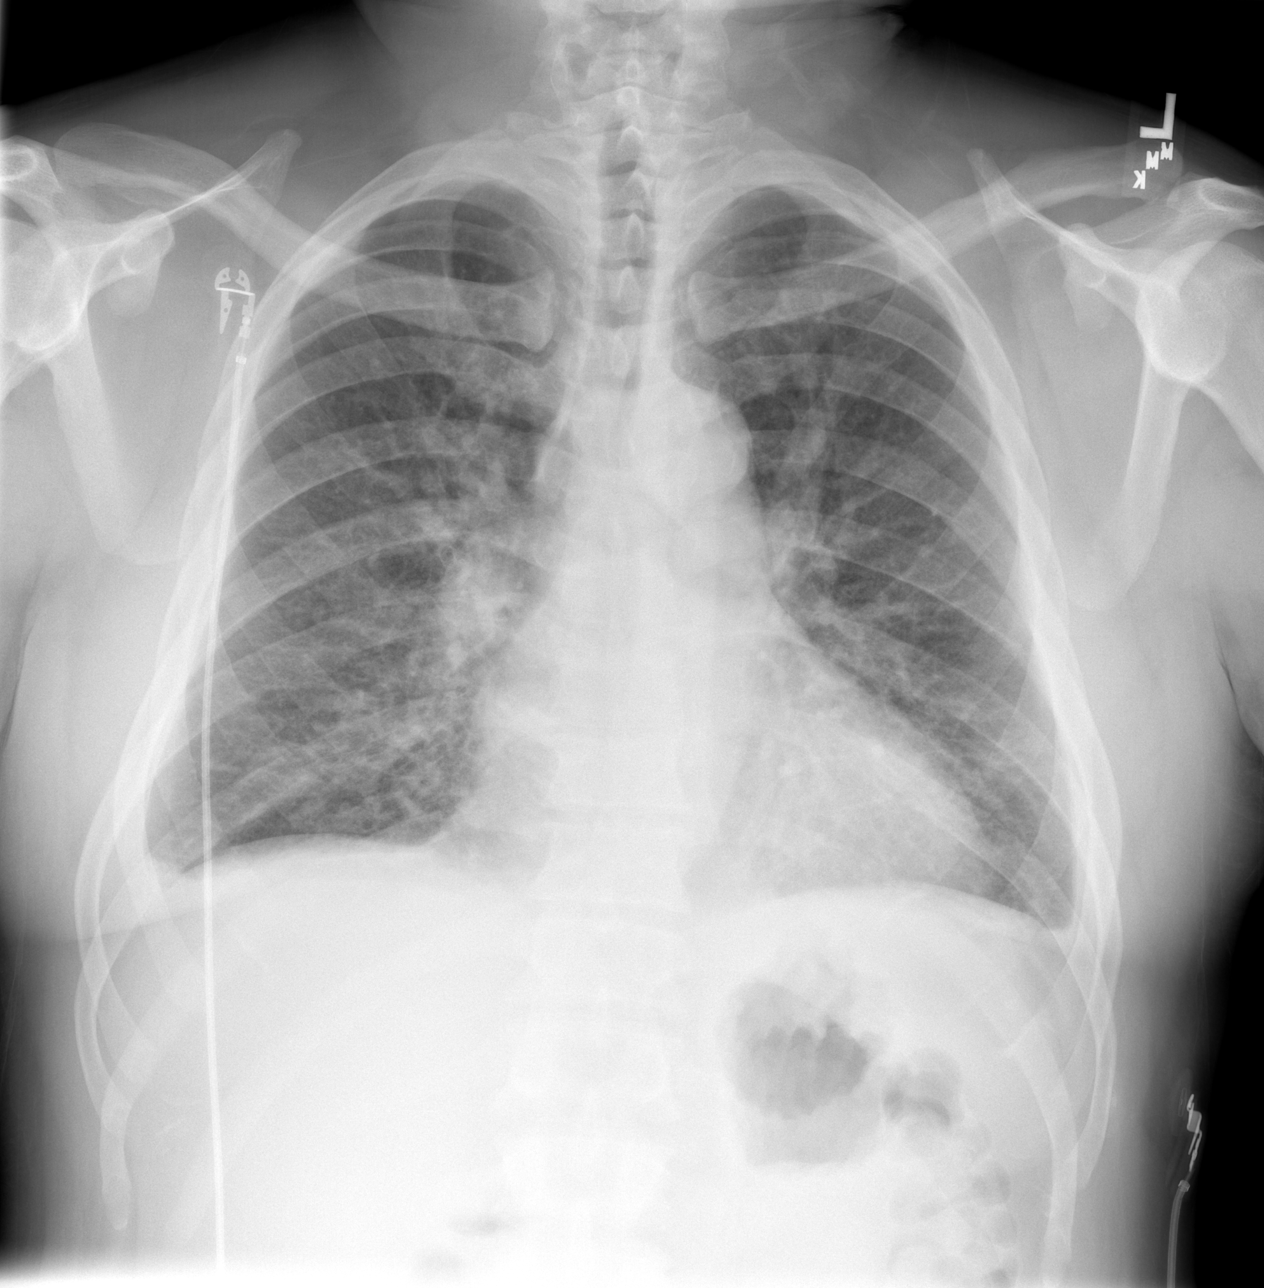

[w abdomen upright]
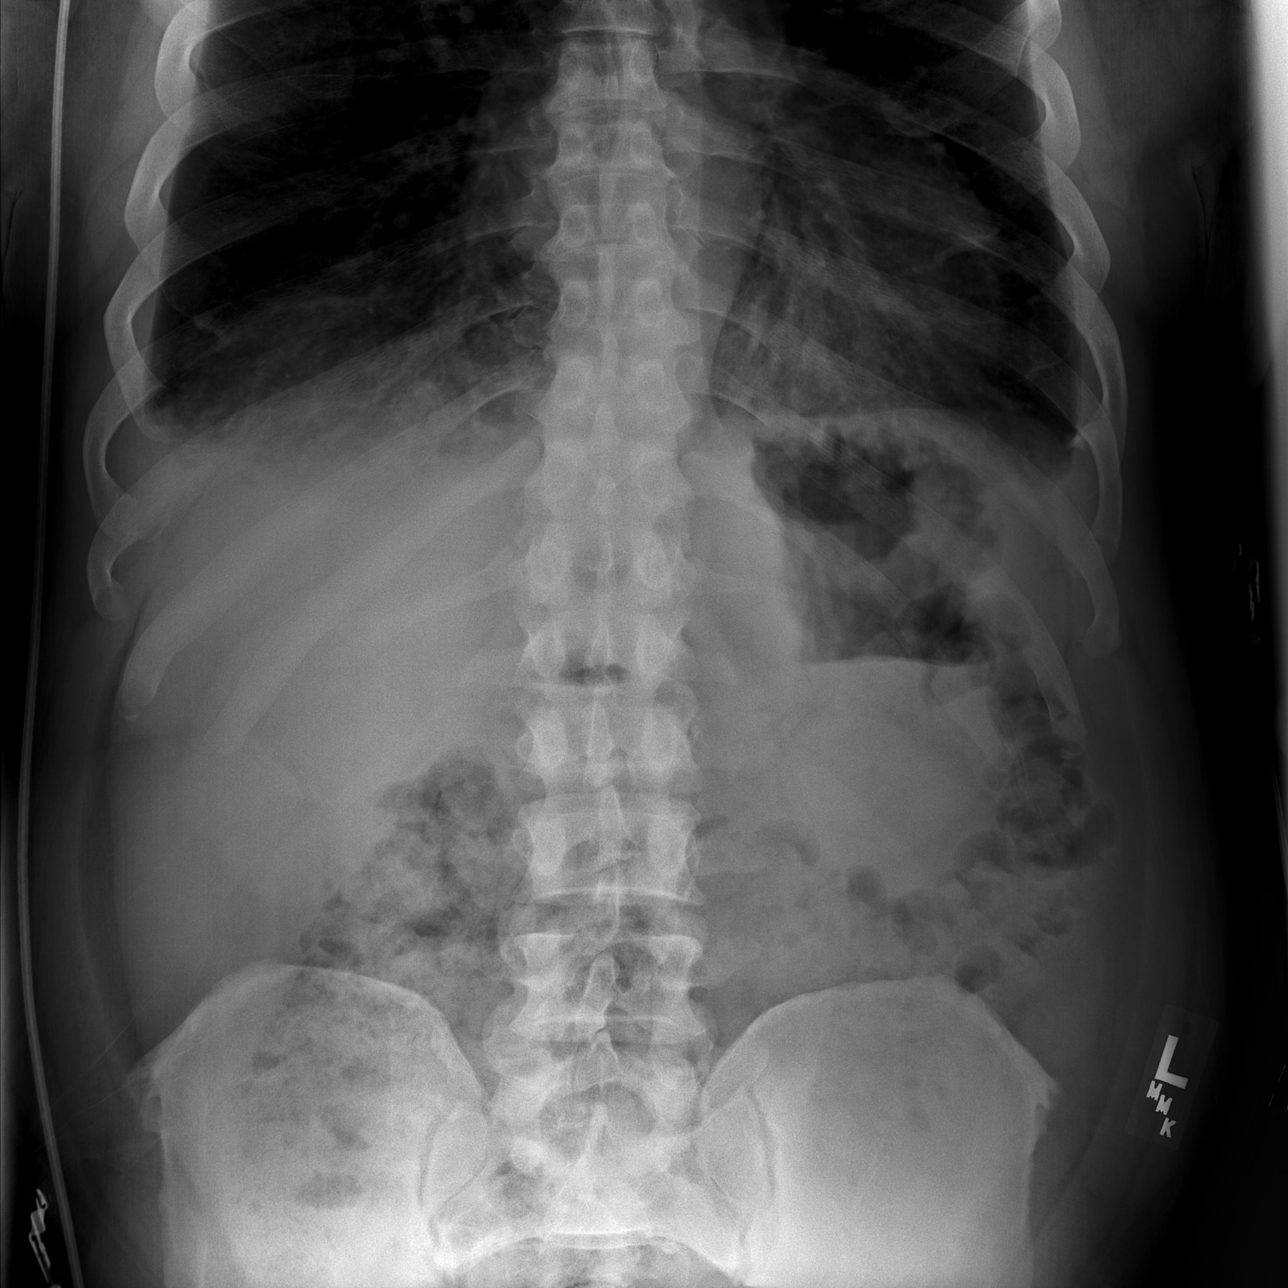

[t abdomen supine]
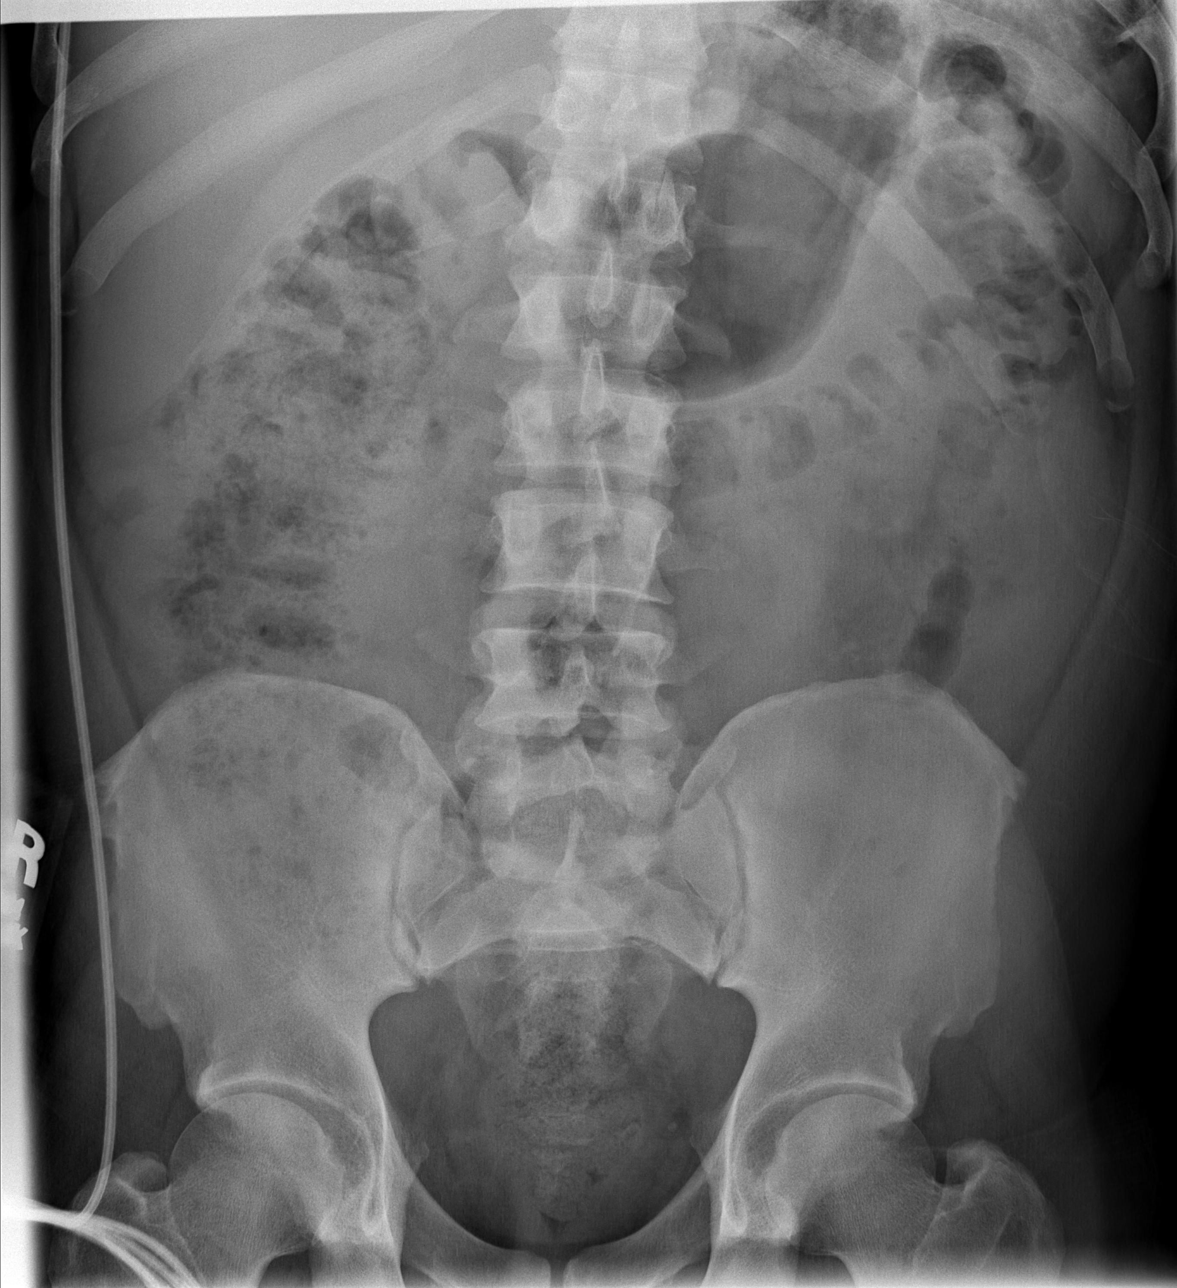

[3 of 3 positions shown; findings below may reference images not displayed]

FINDINGS: Bowel gas pattern unremarkable without evidence of
obstruction or significant ileus.  No evidence of free air on the
erect image.  Air-fluid level in the stomach; no significant air
fluid levels in the abdomen.  Visible psoas margins.  Phlebolith on
the left side of the pelvis.  No visible opaque urinary tract
calculi.  Regional skeleton unremarkable.

Interval increase in the heart size, with mild cardiomegaly
currently.  Diffuse interstitial pulmonary edema.  Bilateral
pleural effusions, small to moderate in size.  No localized
airspace consolidation.
IMPRESSION: 1.  No acute abdominal abnormalities.
2.  Cardiomegaly and diffuse interstitial pulmonary edema
consistent with CHF.  Small to moderate sized bilateral pleural
effusions.

## 2008-12-18 ENCOUNTER — Inpatient Hospital Stay (HOSPITAL_COMMUNITY): Admission: EM | Admit: 2008-12-18 | Discharge: 2008-12-20 | Payer: Self-pay | Admitting: Emergency Medicine

## 2008-12-19 ENCOUNTER — Encounter (INDEPENDENT_AMBULATORY_CARE_PROVIDER_SITE_OTHER): Payer: Self-pay | Admitting: Cardiology

## 2008-12-22 ENCOUNTER — Emergency Department (HOSPITAL_COMMUNITY): Admission: EM | Admit: 2008-12-22 | Discharge: 2008-12-22 | Payer: Self-pay | Admitting: Emergency Medicine

## 2009-01-03 IMAGING — CT CT HEAD W/O CM
1 of 2 series · 13 of 30 positions shown, 17 images · non-contrast
Comparison: None available

CLINICAL DATA: Altered level of consciousness.

CT HEAD WITHOUT CONTRAST
TECHNIQUE: Contiguous axial images were obtained from the base of
the skull through the vertex without contrast.

[Series 2: brain · axial · 0.47mm/px · z∈[+160,+301]mm · 13 of 84 slices shown, 17 images]
[im 6/84  brain]
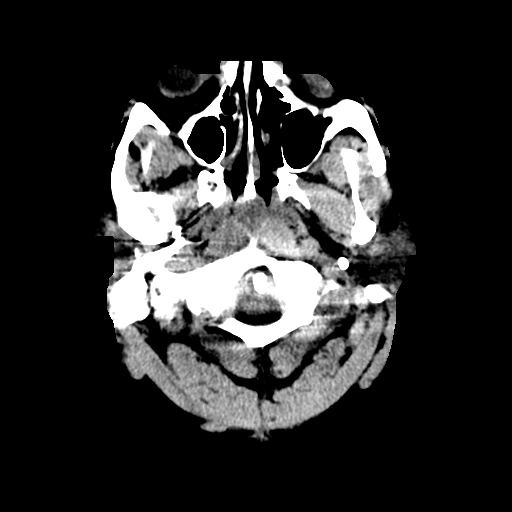
[im 6/84  bone]
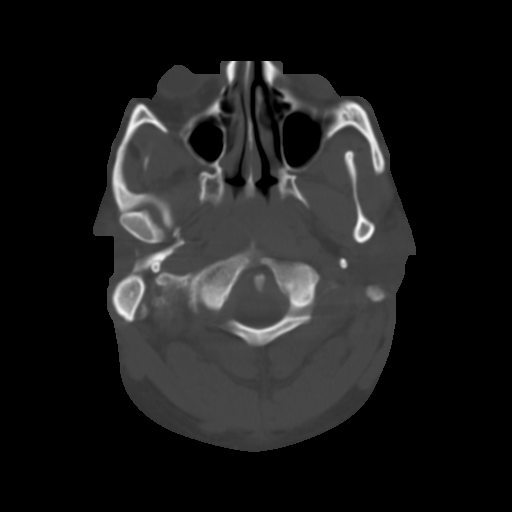
[im 12/84  brain]
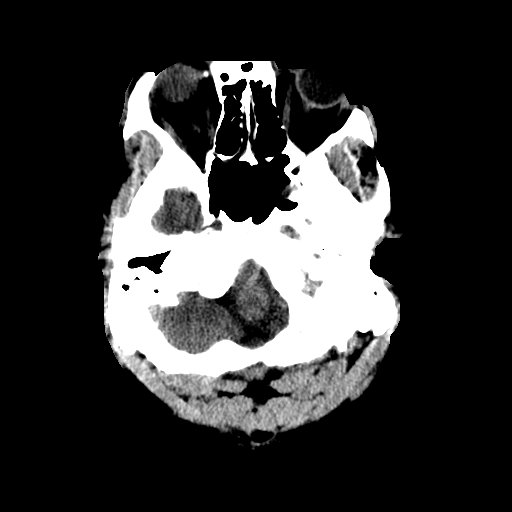
[im 18/84  brain]
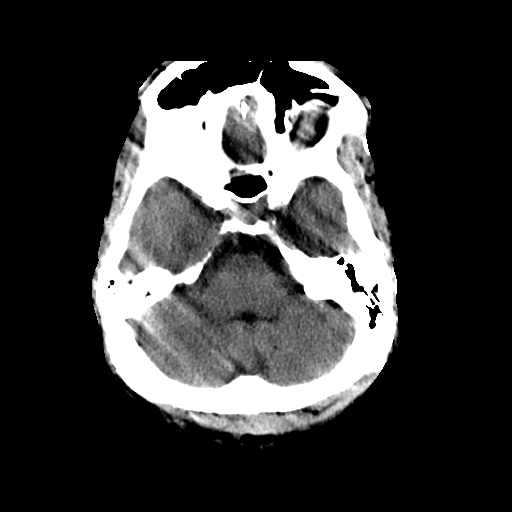
[im 24/84  brain]
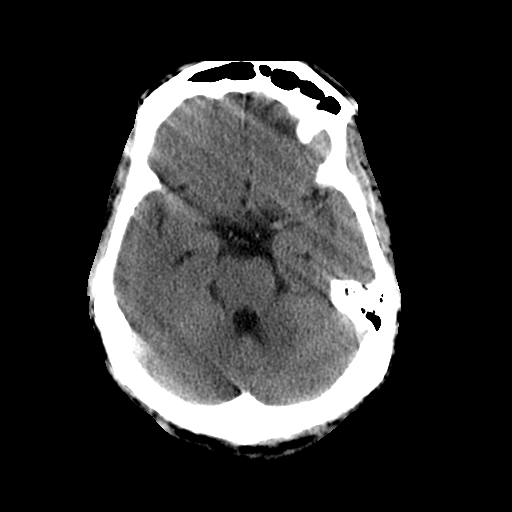
[im 30/84  brain]
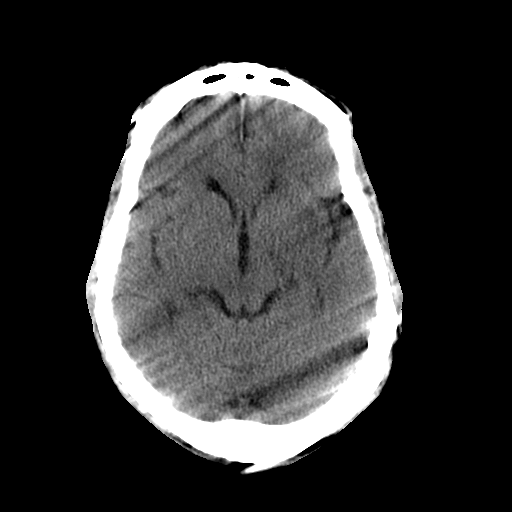
[im 30/84  bone]
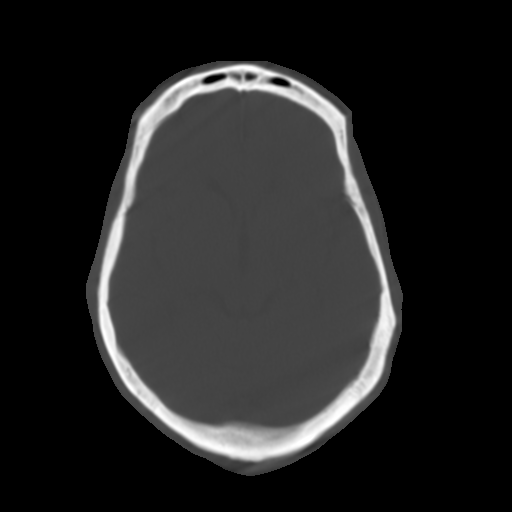
[im 36/84  brain]
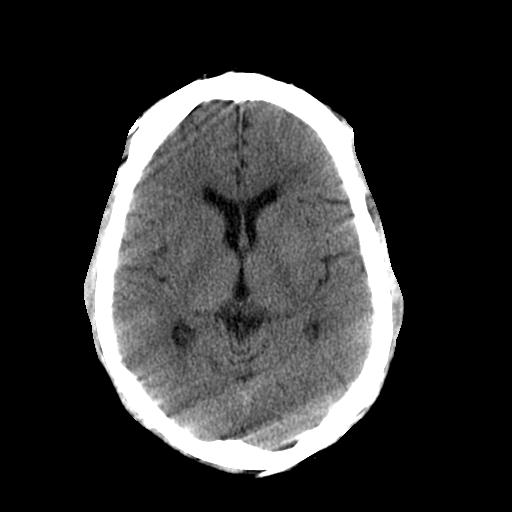
[im 42/84  brain]
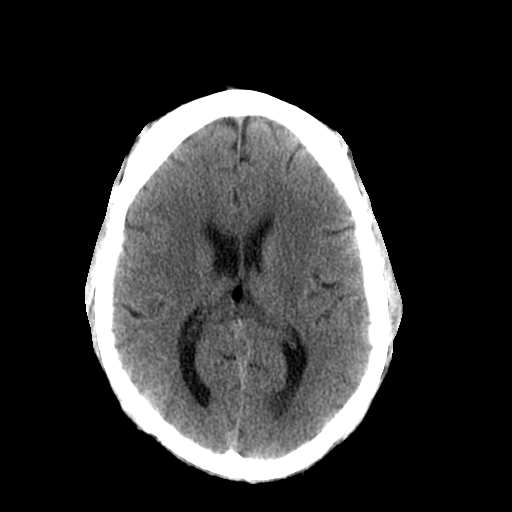
[im 48/84  brain]
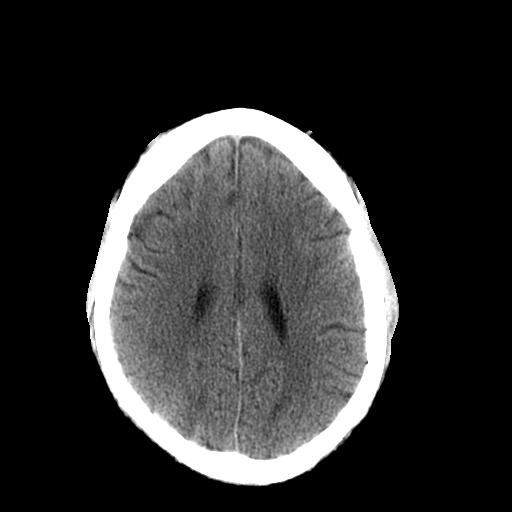
[im 54/84  brain]
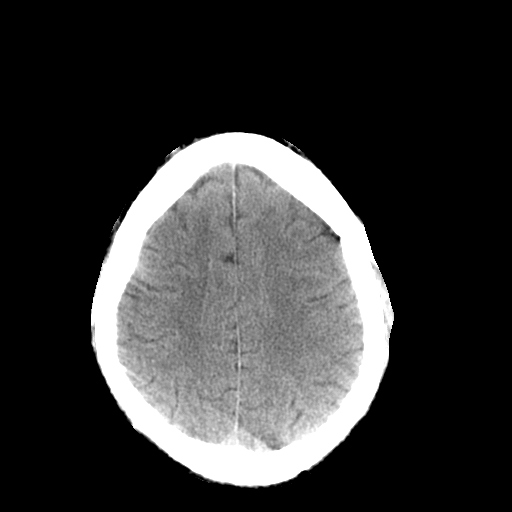
[im 54/84  bone]
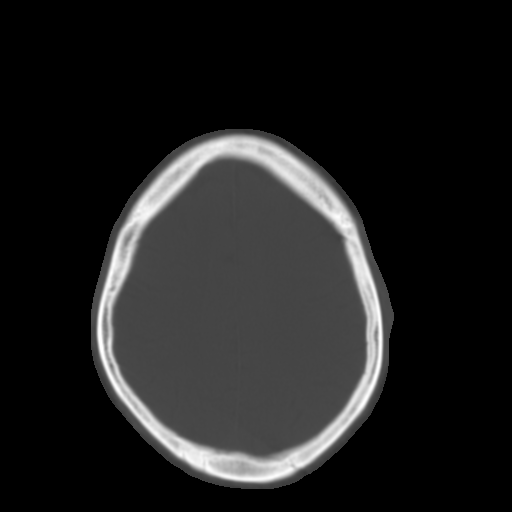
[im 60/84  brain]
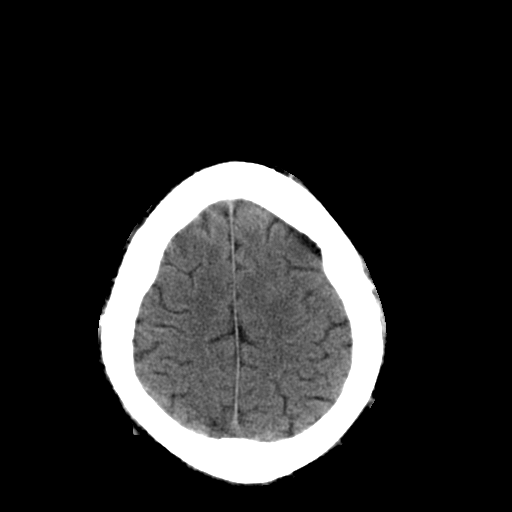
[im 66/84  brain]
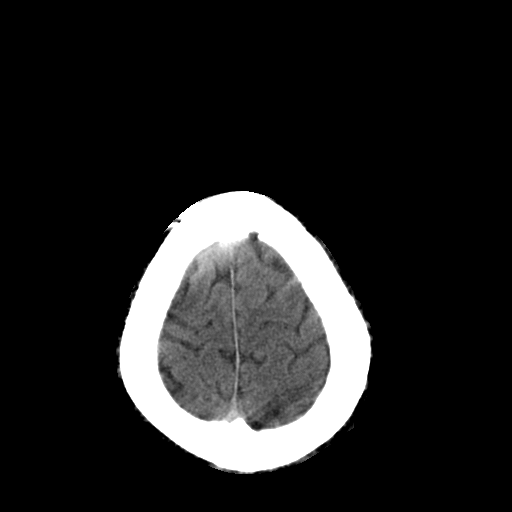
[im 72/84  brain]
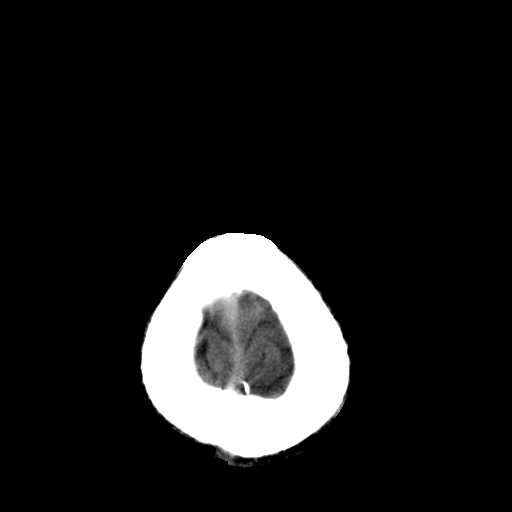
[im 78/84  brain]
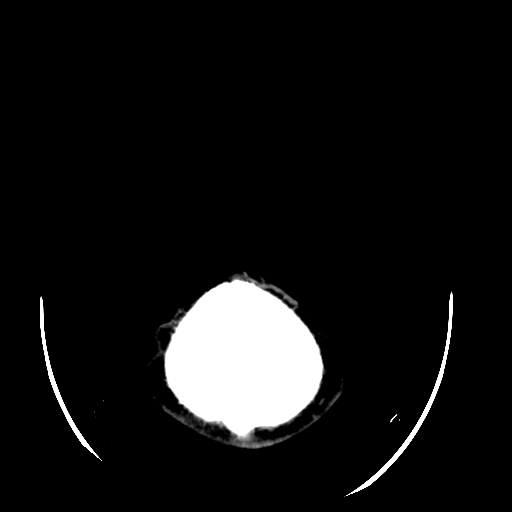
[im 78/84  bone]
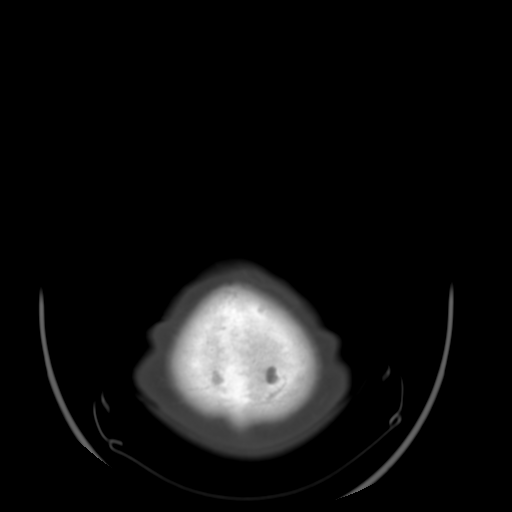

[13 of 30 positions shown; findings below may reference images not displayed]

FINDINGS: Motion artifact is present throughout the exam.  Several
scans were repeated with little improvement.  There is no gross
mass lesion, mass effect, midline shift, hydrocephalus, or
hemorrhage.  No territorial ischemia/infarction.  Mild right
ethmoid sinus disease.
IMPRESSION: Motion artifact limited exam.  No gross acute intracranial
abnormality.

## 2009-01-10 ENCOUNTER — Emergency Department (HOSPITAL_COMMUNITY): Admission: EM | Admit: 2009-01-10 | Discharge: 2009-01-10 | Payer: Self-pay | Admitting: Emergency Medicine

## 2009-01-12 ENCOUNTER — Emergency Department (HOSPITAL_COMMUNITY): Admission: EM | Admit: 2009-01-12 | Discharge: 2009-01-12 | Payer: Self-pay | Admitting: Emergency Medicine

## 2009-01-16 ENCOUNTER — Emergency Department (HOSPITAL_COMMUNITY): Admission: EM | Admit: 2009-01-16 | Discharge: 2009-01-16 | Payer: Self-pay | Admitting: Emergency Medicine

## 2009-01-21 ENCOUNTER — Emergency Department (HOSPITAL_COMMUNITY): Admission: EM | Admit: 2009-01-21 | Discharge: 2009-01-21 | Payer: Self-pay | Admitting: Emergency Medicine

## 2009-01-23 ENCOUNTER — Emergency Department (HOSPITAL_COMMUNITY): Admission: EM | Admit: 2009-01-23 | Discharge: 2009-01-23 | Payer: Self-pay | Admitting: Emergency Medicine

## 2009-01-24 ENCOUNTER — Emergency Department (HOSPITAL_COMMUNITY): Admission: EM | Admit: 2009-01-24 | Discharge: 2009-01-24 | Payer: Self-pay | Admitting: Emergency Medicine

## 2009-03-08 ENCOUNTER — Emergency Department (HOSPITAL_COMMUNITY): Admission: EM | Admit: 2009-03-08 | Discharge: 2009-03-09 | Payer: Self-pay | Admitting: Emergency Medicine

## 2009-03-09 ENCOUNTER — Emergency Department (HOSPITAL_COMMUNITY): Admission: EM | Admit: 2009-03-09 | Discharge: 2009-03-09 | Payer: Self-pay | Admitting: Emergency Medicine

## 2009-03-12 ENCOUNTER — Other Ambulatory Visit: Payer: Self-pay | Admitting: Emergency Medicine

## 2009-03-12 ENCOUNTER — Inpatient Hospital Stay (HOSPITAL_COMMUNITY): Admission: AD | Admit: 2009-03-12 | Discharge: 2009-03-21 | Payer: Self-pay | Admitting: Psychiatry

## 2009-03-12 ENCOUNTER — Ambulatory Visit: Payer: Self-pay | Admitting: Psychiatry

## 2009-03-26 ENCOUNTER — Emergency Department (HOSPITAL_COMMUNITY): Admission: EM | Admit: 2009-03-26 | Discharge: 2009-03-26 | Payer: Self-pay | Admitting: Emergency Medicine

## 2009-03-28 IMAGING — CR DG ABDOMEN 1V
1 series · 1 of 1 positions shown · non-contrast
Comparison: 05/31/2008

CLINICAL DATA: Abdominal pain.  Nausea.

ABDOMEN - 1 VIEW

[t abdomen supine]
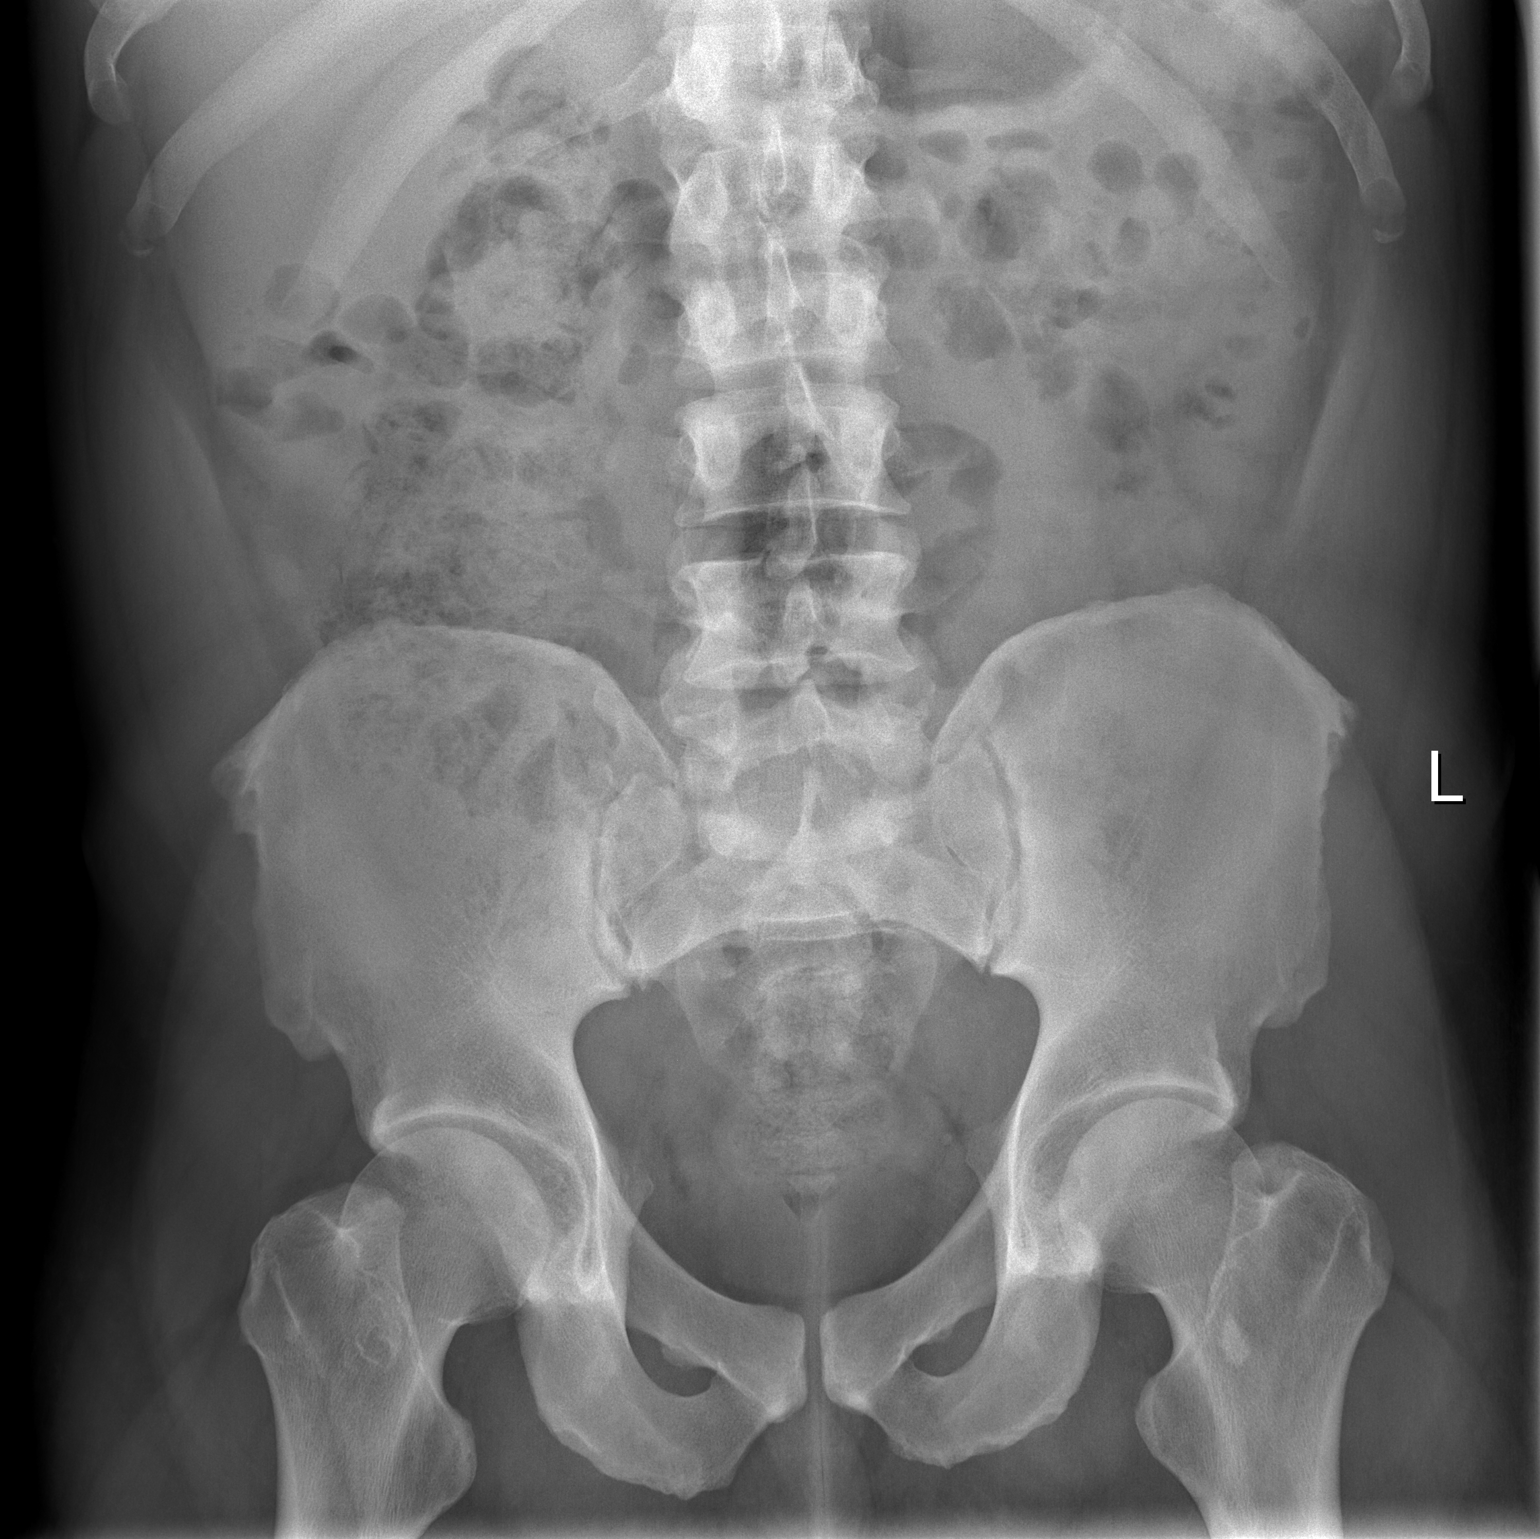

[1 of 1 positions shown; findings below may reference images not displayed]

FINDINGS: No sign of ileus or obstruction.  There is a moderate
amount of fecal matter in the right colon.  No worrisome
calcifications or bony findings.
IMPRESSION: Within normal limits.

## 2009-03-29 ENCOUNTER — Emergency Department (HOSPITAL_COMMUNITY): Admission: EM | Admit: 2009-03-29 | Discharge: 2009-03-29 | Payer: Self-pay | Admitting: Emergency Medicine

## 2009-04-06 ENCOUNTER — Emergency Department (HOSPITAL_COMMUNITY): Admission: EM | Admit: 2009-04-06 | Discharge: 2009-04-06 | Payer: Self-pay | Admitting: Emergency Medicine

## 2009-04-07 IMAGING — CR DG FEET 3 VIEWS BILAT
6 series · 6 of 6 positions shown · non-contrast
Comparison: None

CLINICAL DATA: Diabetic with injury.  Abrasions to bottom of both
feet.

BILATERAL FEET - 3 VIEW

[t foot ap left]
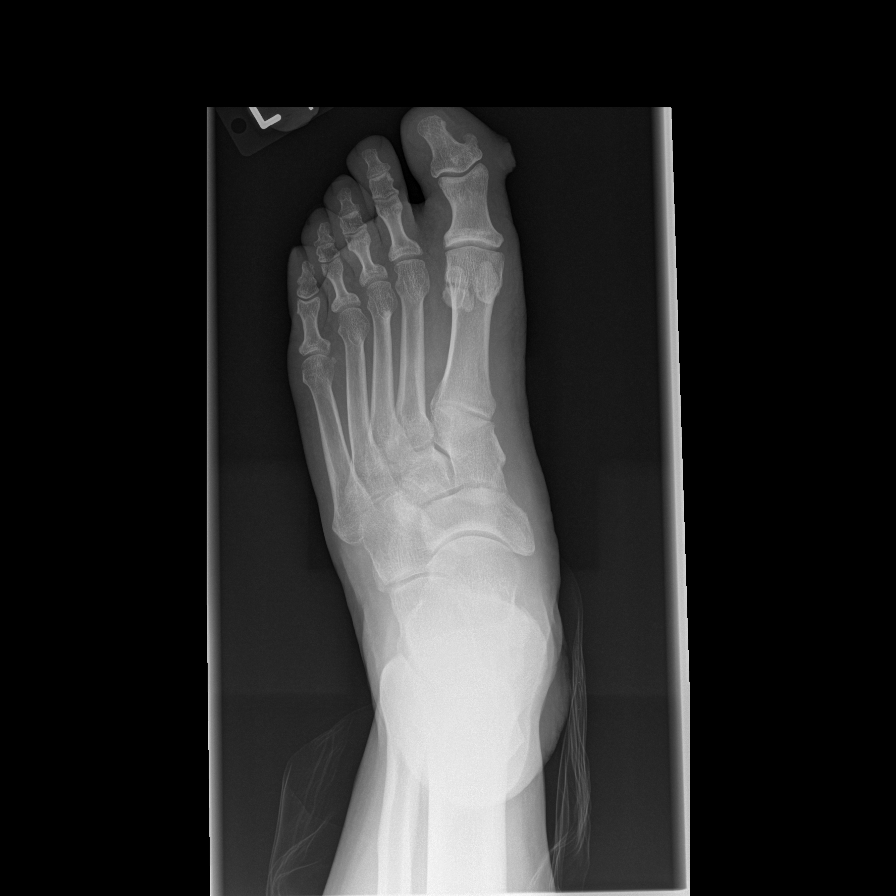

[t foot oblique left]
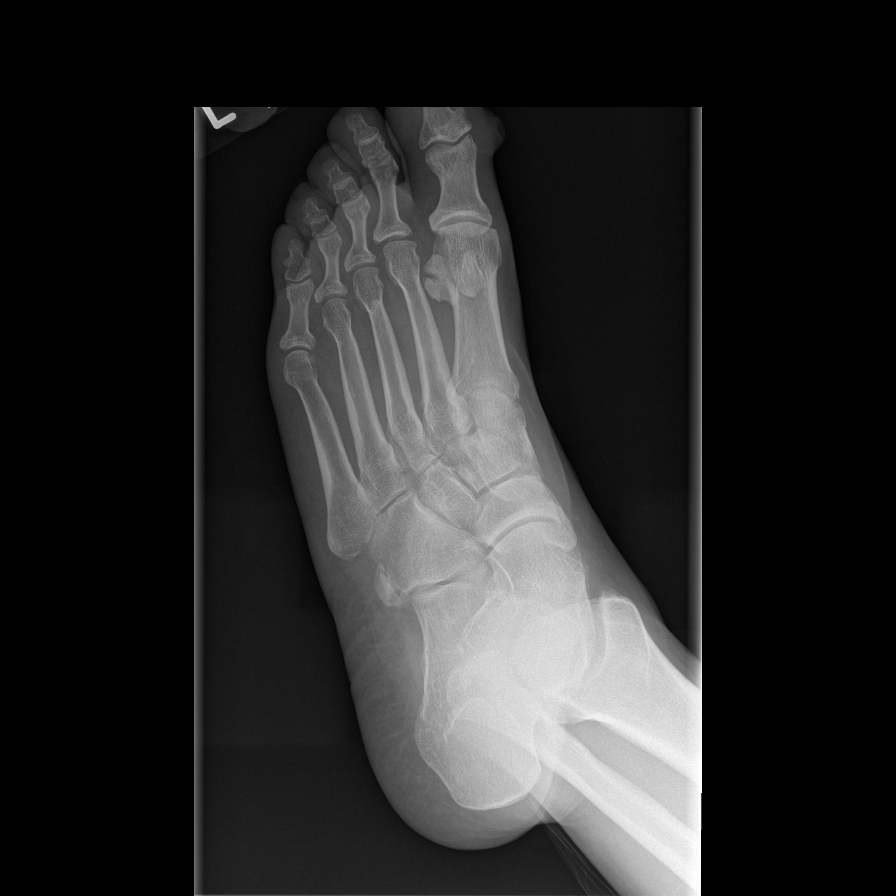

[t foot lat left]
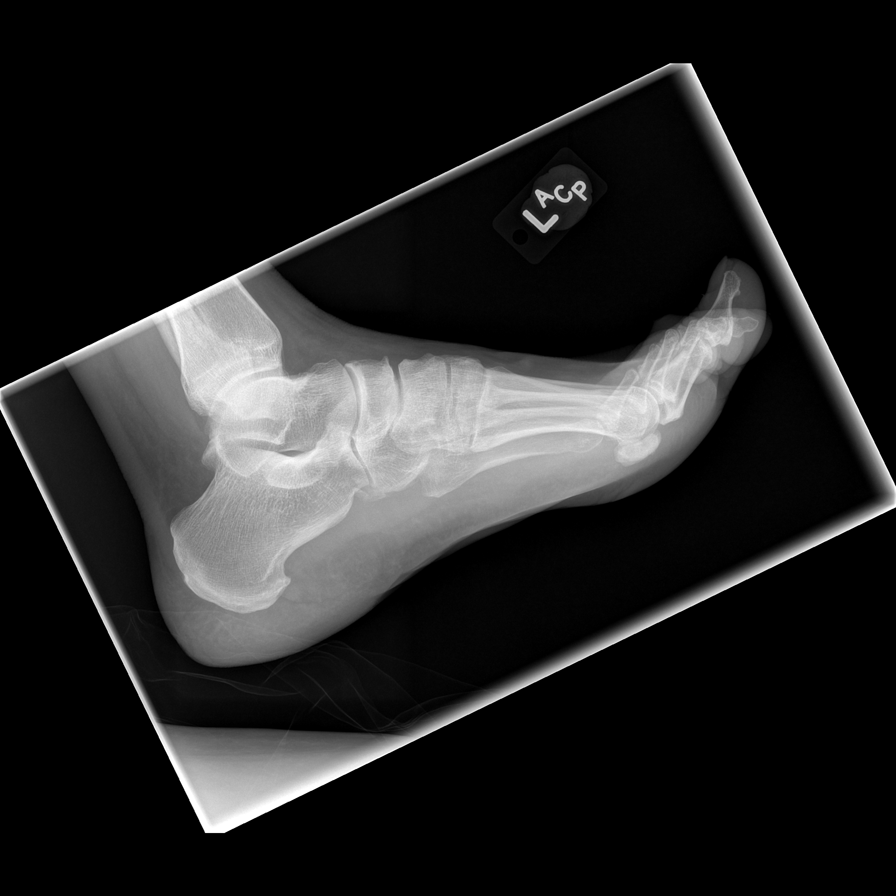

[t foot oblique right]
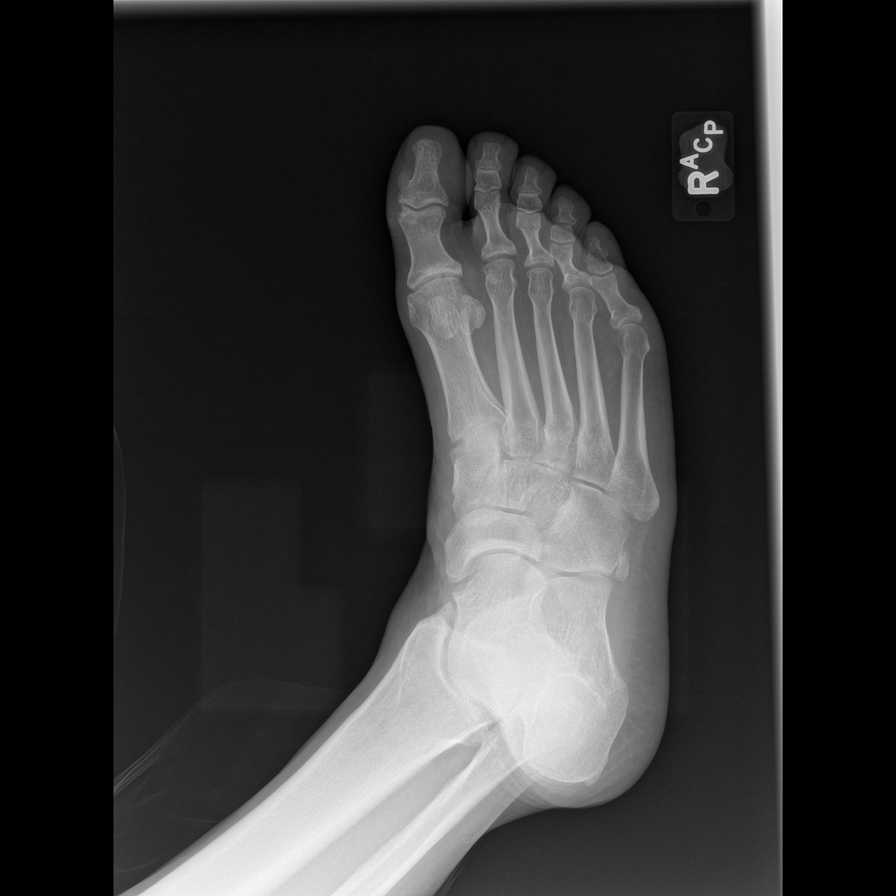

[t foot lat right]
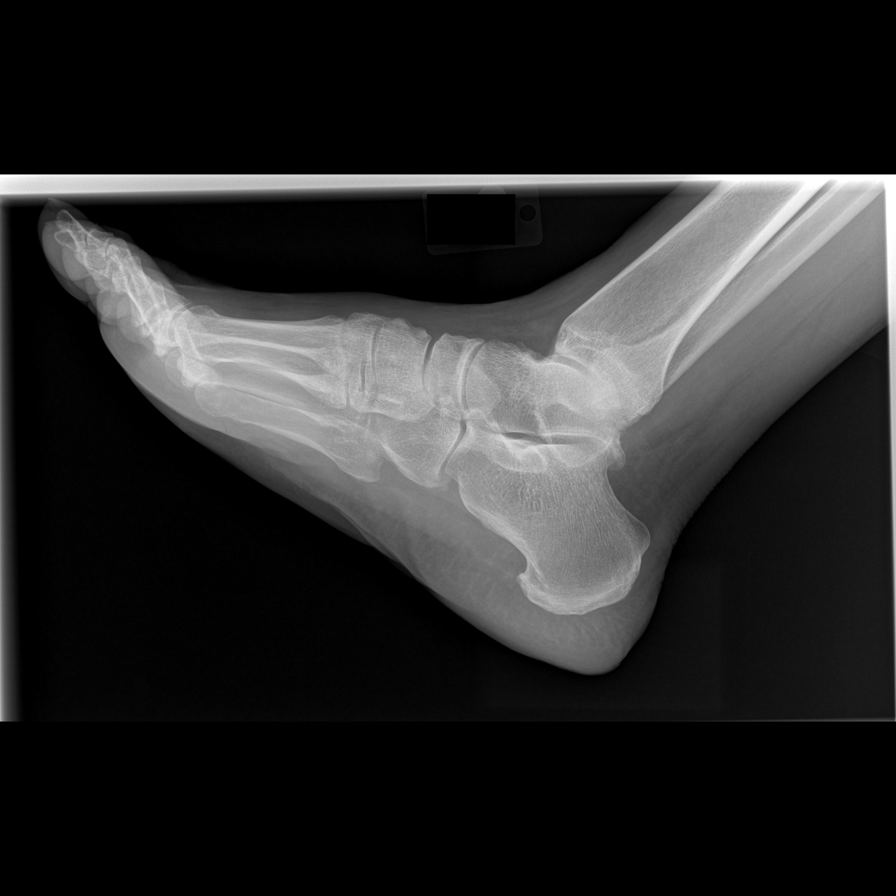

[t foot ap right]
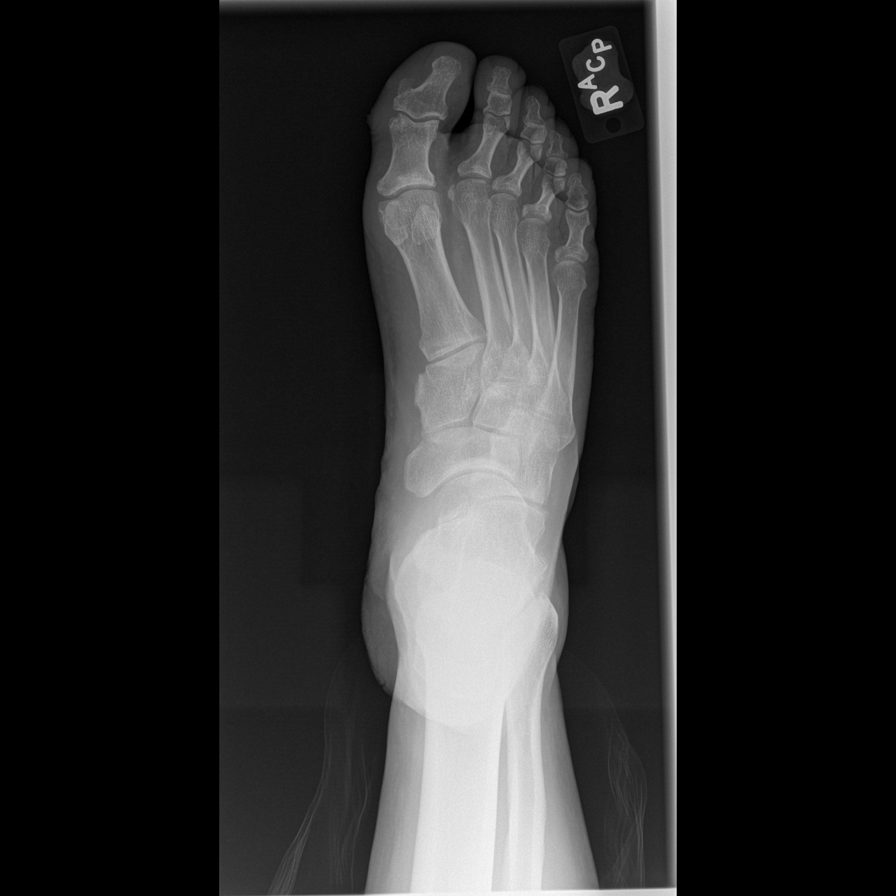

[6 of 6 positions shown; findings below may reference images not displayed]

FINDINGS: 3 views of the left foot demonstrate no acute osseous
abnormality.  No soft tissue ulcer or gas.  No radiopaque foreign
object.

3 views of the right foot demonstrate no soft tissue ulcer or
radiopaque foreign body.  Small calcaneal spur.  Mild midfoot
degenerative change.
IMPRESSION: No acute findings in the feet.

## 2009-04-08 ENCOUNTER — Emergency Department (HOSPITAL_COMMUNITY): Admission: EM | Admit: 2009-04-08 | Discharge: 2009-04-09 | Payer: Self-pay | Admitting: Emergency Medicine

## 2009-04-12 ENCOUNTER — Emergency Department (HOSPITAL_COMMUNITY): Admission: EM | Admit: 2009-04-12 | Discharge: 2009-04-12 | Payer: Self-pay | Admitting: Emergency Medicine

## 2009-04-13 ENCOUNTER — Emergency Department (HOSPITAL_COMMUNITY): Admission: EM | Admit: 2009-04-13 | Discharge: 2009-04-14 | Payer: Self-pay | Admitting: Emergency Medicine

## 2009-04-16 ENCOUNTER — Other Ambulatory Visit: Payer: Self-pay | Admitting: Emergency Medicine

## 2009-04-17 ENCOUNTER — Inpatient Hospital Stay (HOSPITAL_COMMUNITY): Admission: AD | Admit: 2009-04-17 | Discharge: 2009-04-19 | Payer: Self-pay | Admitting: Psychiatry

## 2009-04-23 ENCOUNTER — Emergency Department (HOSPITAL_COMMUNITY): Admission: EM | Admit: 2009-04-23 | Discharge: 2009-04-23 | Payer: Self-pay | Admitting: Emergency Medicine

## 2009-04-24 ENCOUNTER — Emergency Department (HOSPITAL_COMMUNITY): Admission: EM | Admit: 2009-04-24 | Discharge: 2009-04-24 | Payer: Self-pay | Admitting: Emergency Medicine

## 2009-04-26 ENCOUNTER — Emergency Department (HOSPITAL_COMMUNITY): Admission: EM | Admit: 2009-04-26 | Discharge: 2009-04-26 | Payer: Self-pay | Admitting: Emergency Medicine

## 2009-04-26 IMAGING — CR DG CHEST 2V
1 series · 1 of 1 positions shown · non-contrast
Comparison: 05/18/2008

CLINICAL DATA: Shortness of breath.

CHEST - 2 VIEW

[view not recorded]
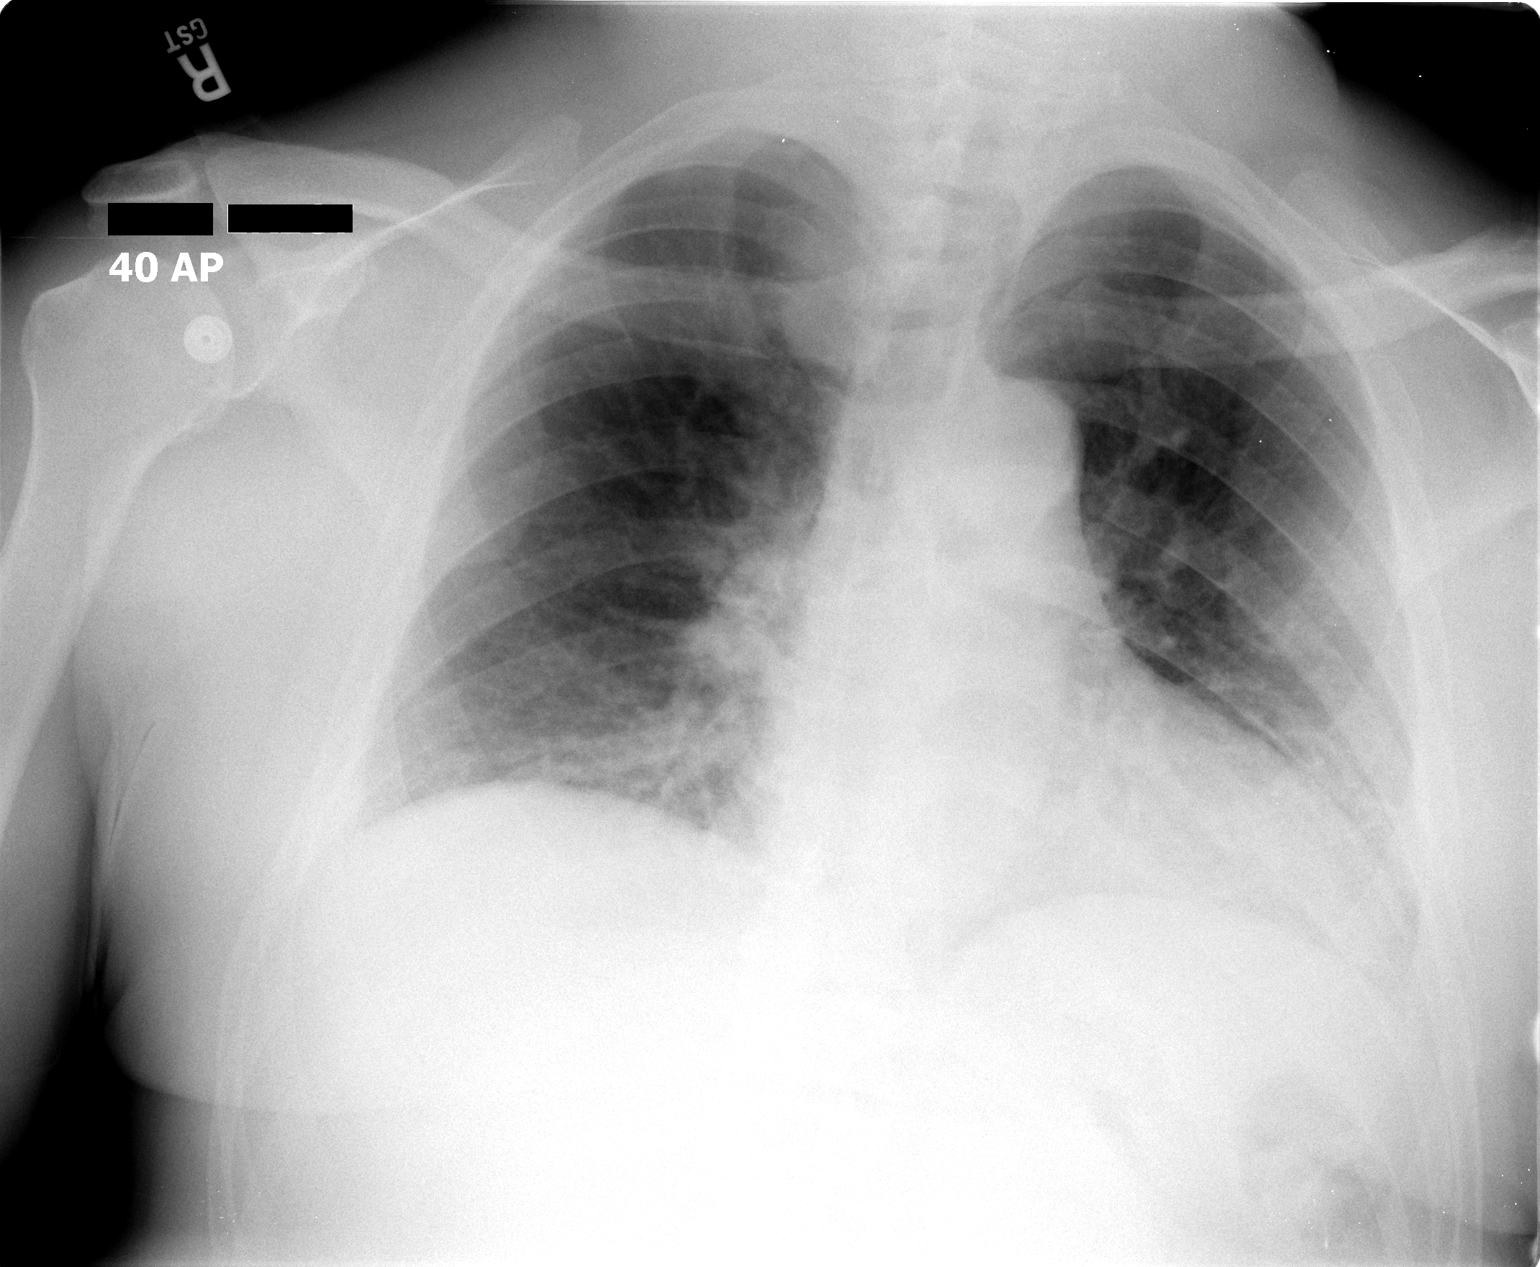

[1 of 1 positions shown; findings below may reference images not displayed]

FINDINGS: Mildly accentuated thoracic kyphosis. Mildly degraded
exam due to AP portable frontal technique and patient body habitus.
Patient rotated to the left. Cardiomegaly accentuated by AP
portable technique.  No pleural effusion or pneumothorax. Mild
right base atelectasis. No congestive failure.  Possible left lower
lobe airspace disease versus overlying soft tissues.
IMPRESSION: 1. Decreased sensitivity and specificity exam due to technique
related factors, as described above.
2.  Possible left lower lobe atelectasis or infection.
3.  Cardiomegaly and low lung volumes without failure.

## 2009-04-26 IMAGING — CT CT HEAD W/O CM
2 series · 15 of 30 positions shown, 19 images · non-contrast
Comparison: 06/22/2008

CLINICAL DATA: Weakness all over.  Altered consciousness.

CT HEAD WITHOUT CONTRAST
TECHNIQUE: Contiguous axial images were obtained from the base of
the skull through the vertex without contrast.

[Series 2: brain · axial · 0.47mm/px · z∈[+141,+265]mm · 13 of 92 slices shown, 17 images]
[im 7/92  brain]
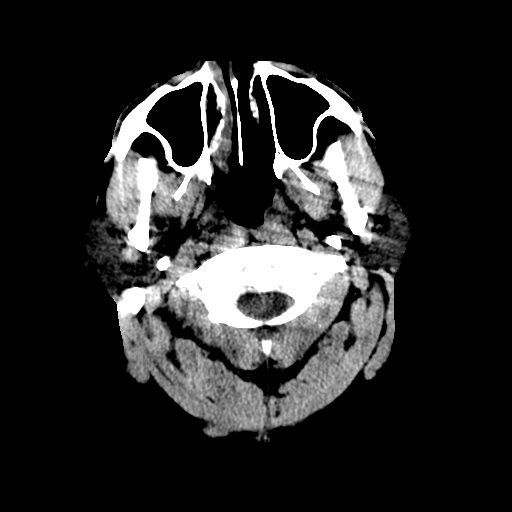
[im 7/92  bone]
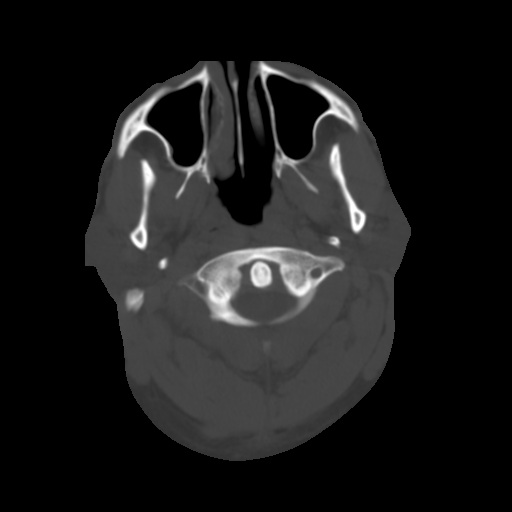
[im 14/92  brain]
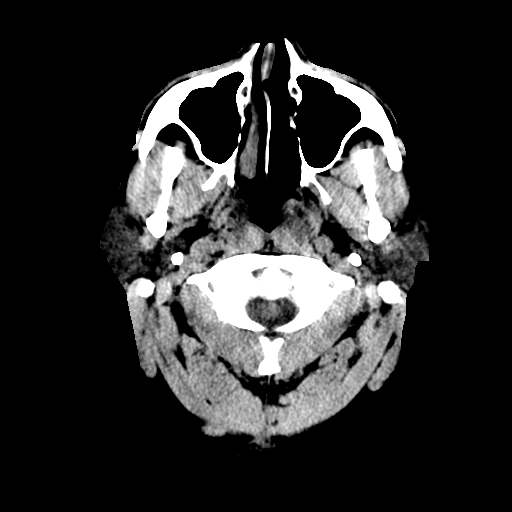
[im 20/92  brain]
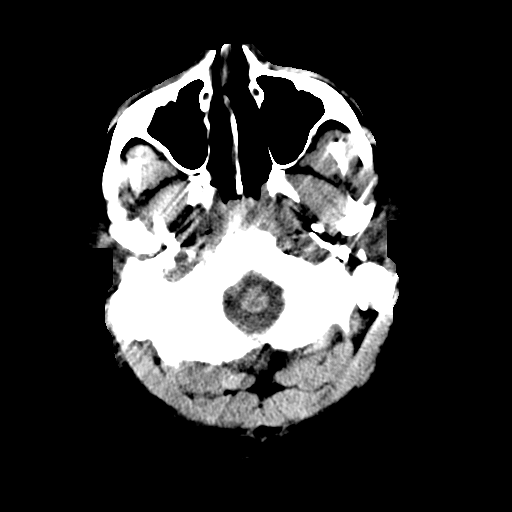
[im 27/92  brain]
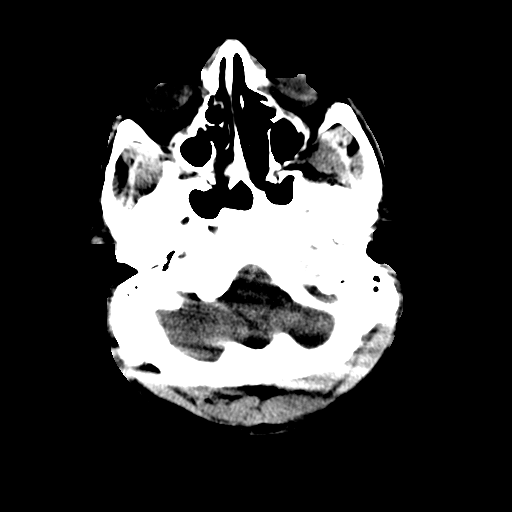
[im 33/92  brain]
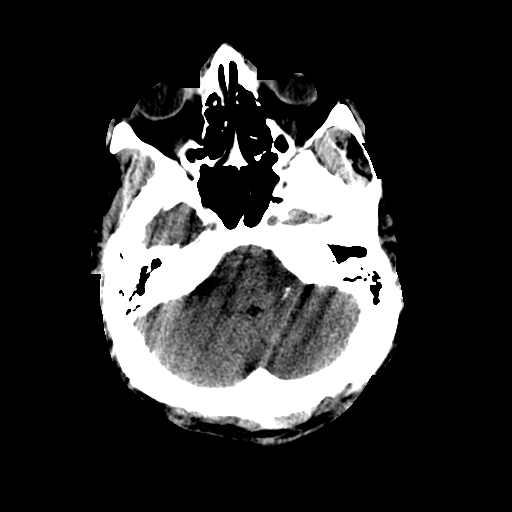
[im 33/92  bone]
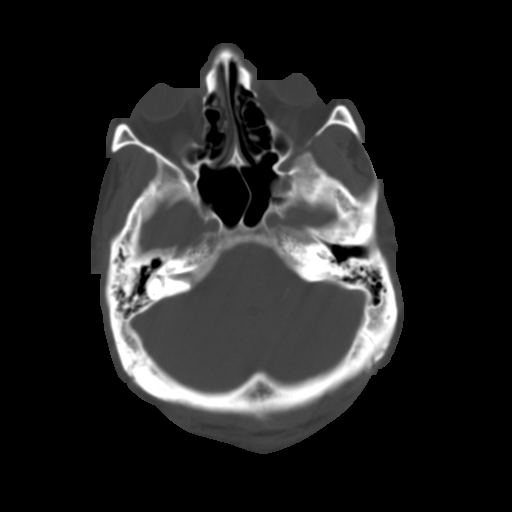
[im 40/92  brain]
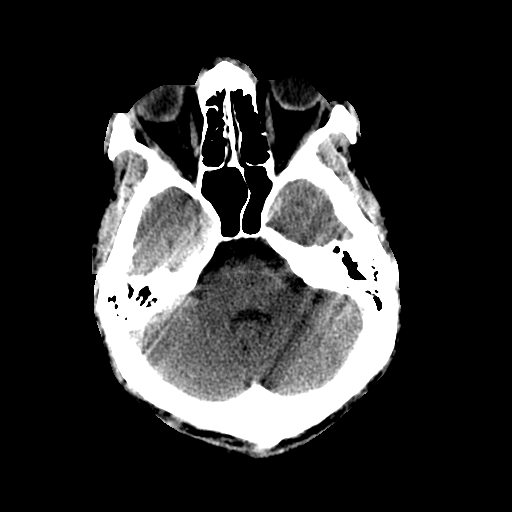
[im 46/92  brain]
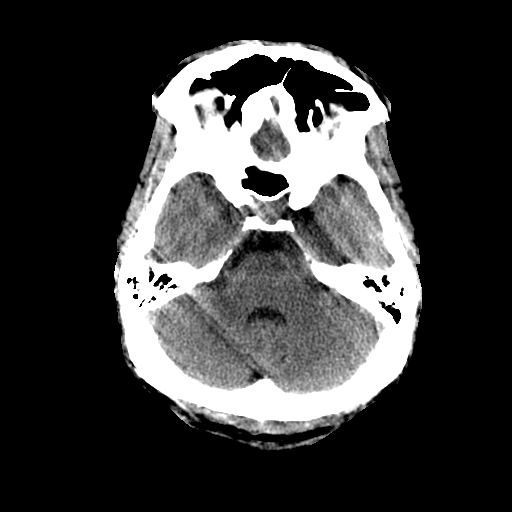
[im 53/92  brain]
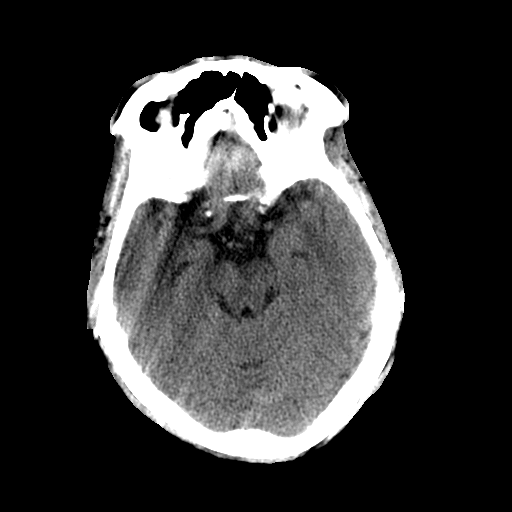
[im 59/92  brain]
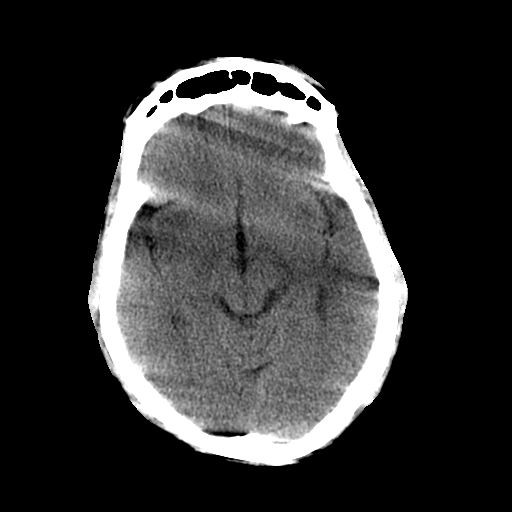
[im 59/92  bone]
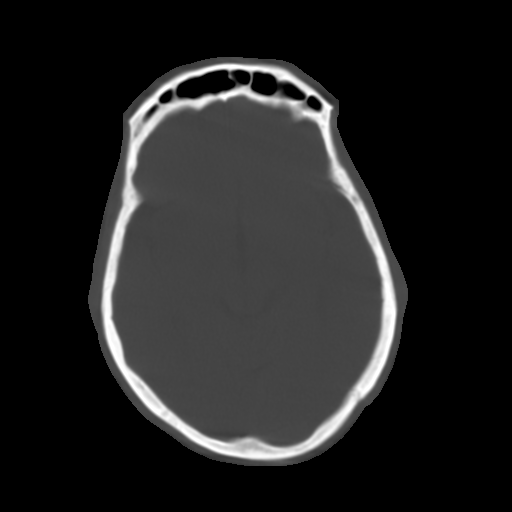
[im 66/92  brain]
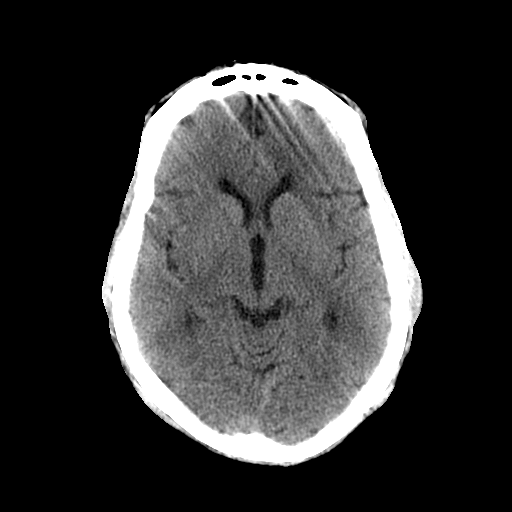
[im 72/92  brain]
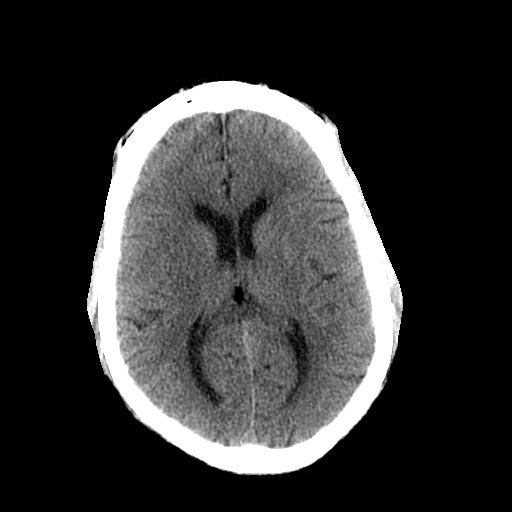
[im 79/92  brain]
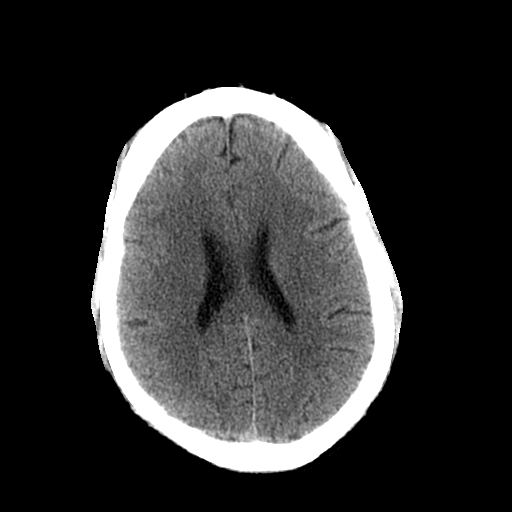
[im 85/92  brain]
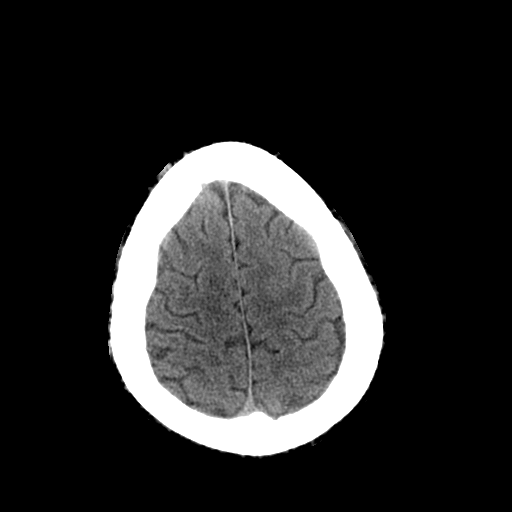
[im 85/92  bone]
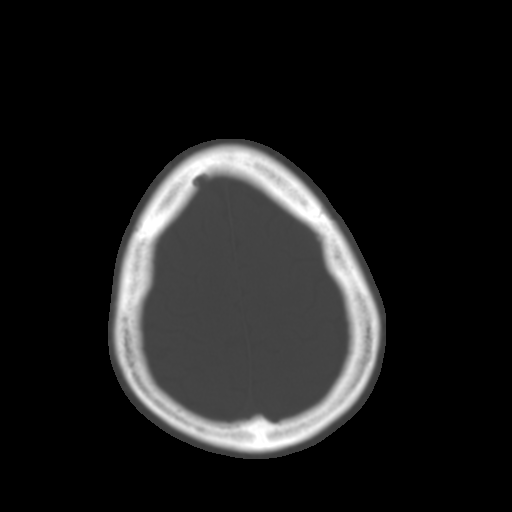

[Series 3: recon 2: brain · axial · 0.47mm/px · z∈[+141,+159]mm · 2 of 92 slices shown]
[im 7/92  brain]
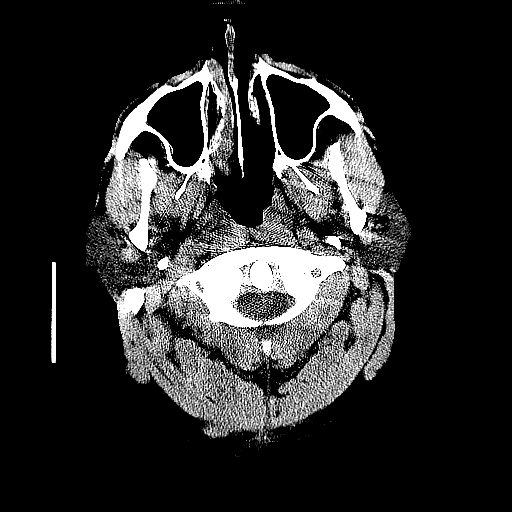
[im 20/92  brain]
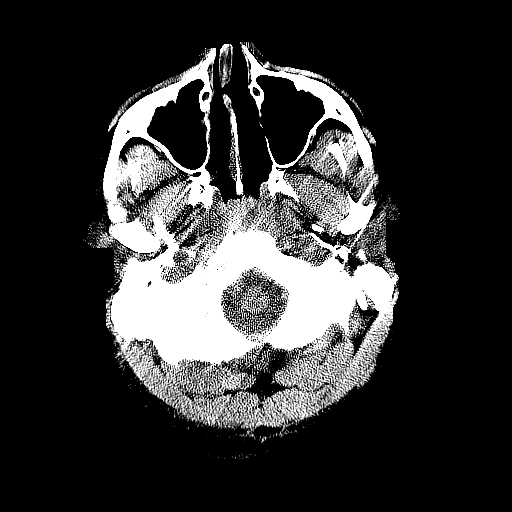

[15 of 30 positions shown; findings below may reference images not displayed]

FINDINGS: Bone windows demonstrate motion artifact despite numerous
attempts.  Left maxillary/left sphenoid sinus mucous retention cyst
or polyp.  Clear mastoid air cells.

Soft tissue windows demonstrate extensive motion artifact.  Given
this artifact, no hemorrhage, acute infarct, hydrocephalus, intra-
axial, or extra-axial fluid collection.

There is subtle low density focus positioned within the midline
just superior to the pineal gland which measures approximately 8x5
mm on image 16 of series 2.  This is felt to be similar to on the
prior exam.
IMPRESSION: 1.  Moderately motion degraded exam.
2.  Suspect 8 mm midline fatty lesion, most likely a dermoid.  When
the patient is able to remain motion free and follow directions,
MRI could confirm.

I called and personally discussed this report with Dr. Laurentno
Singkoh at [DATE] a.m. on 10/13/2008.

## 2009-05-04 IMAGING — CR DG CHEST 2V
2 series · 2 of 2 positions shown · non-contrast
Comparison: 10/13/2008

CLINICAL DATA: Medical clearance

CHEST - 2 VIEW

[w chest pa]
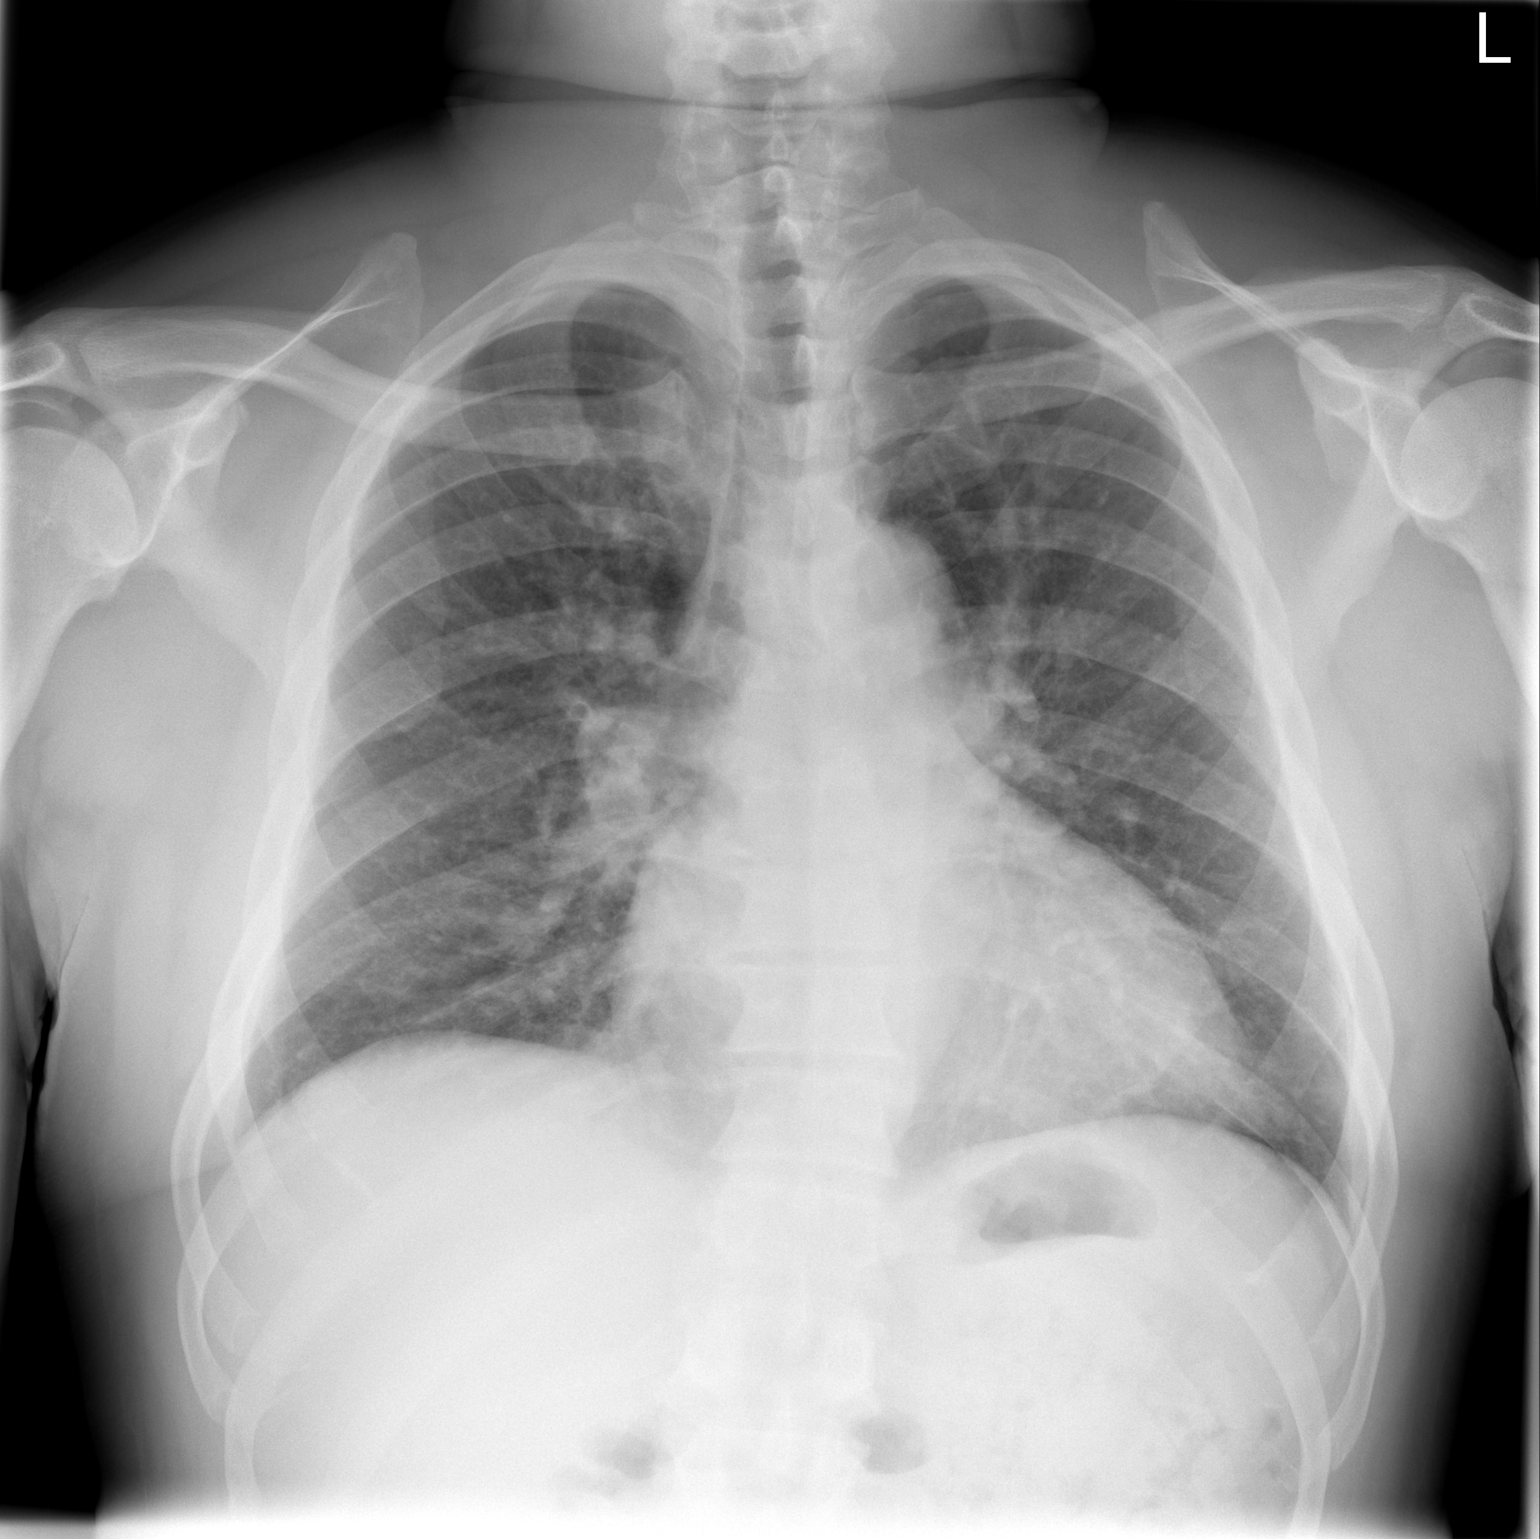

[w chest lat]
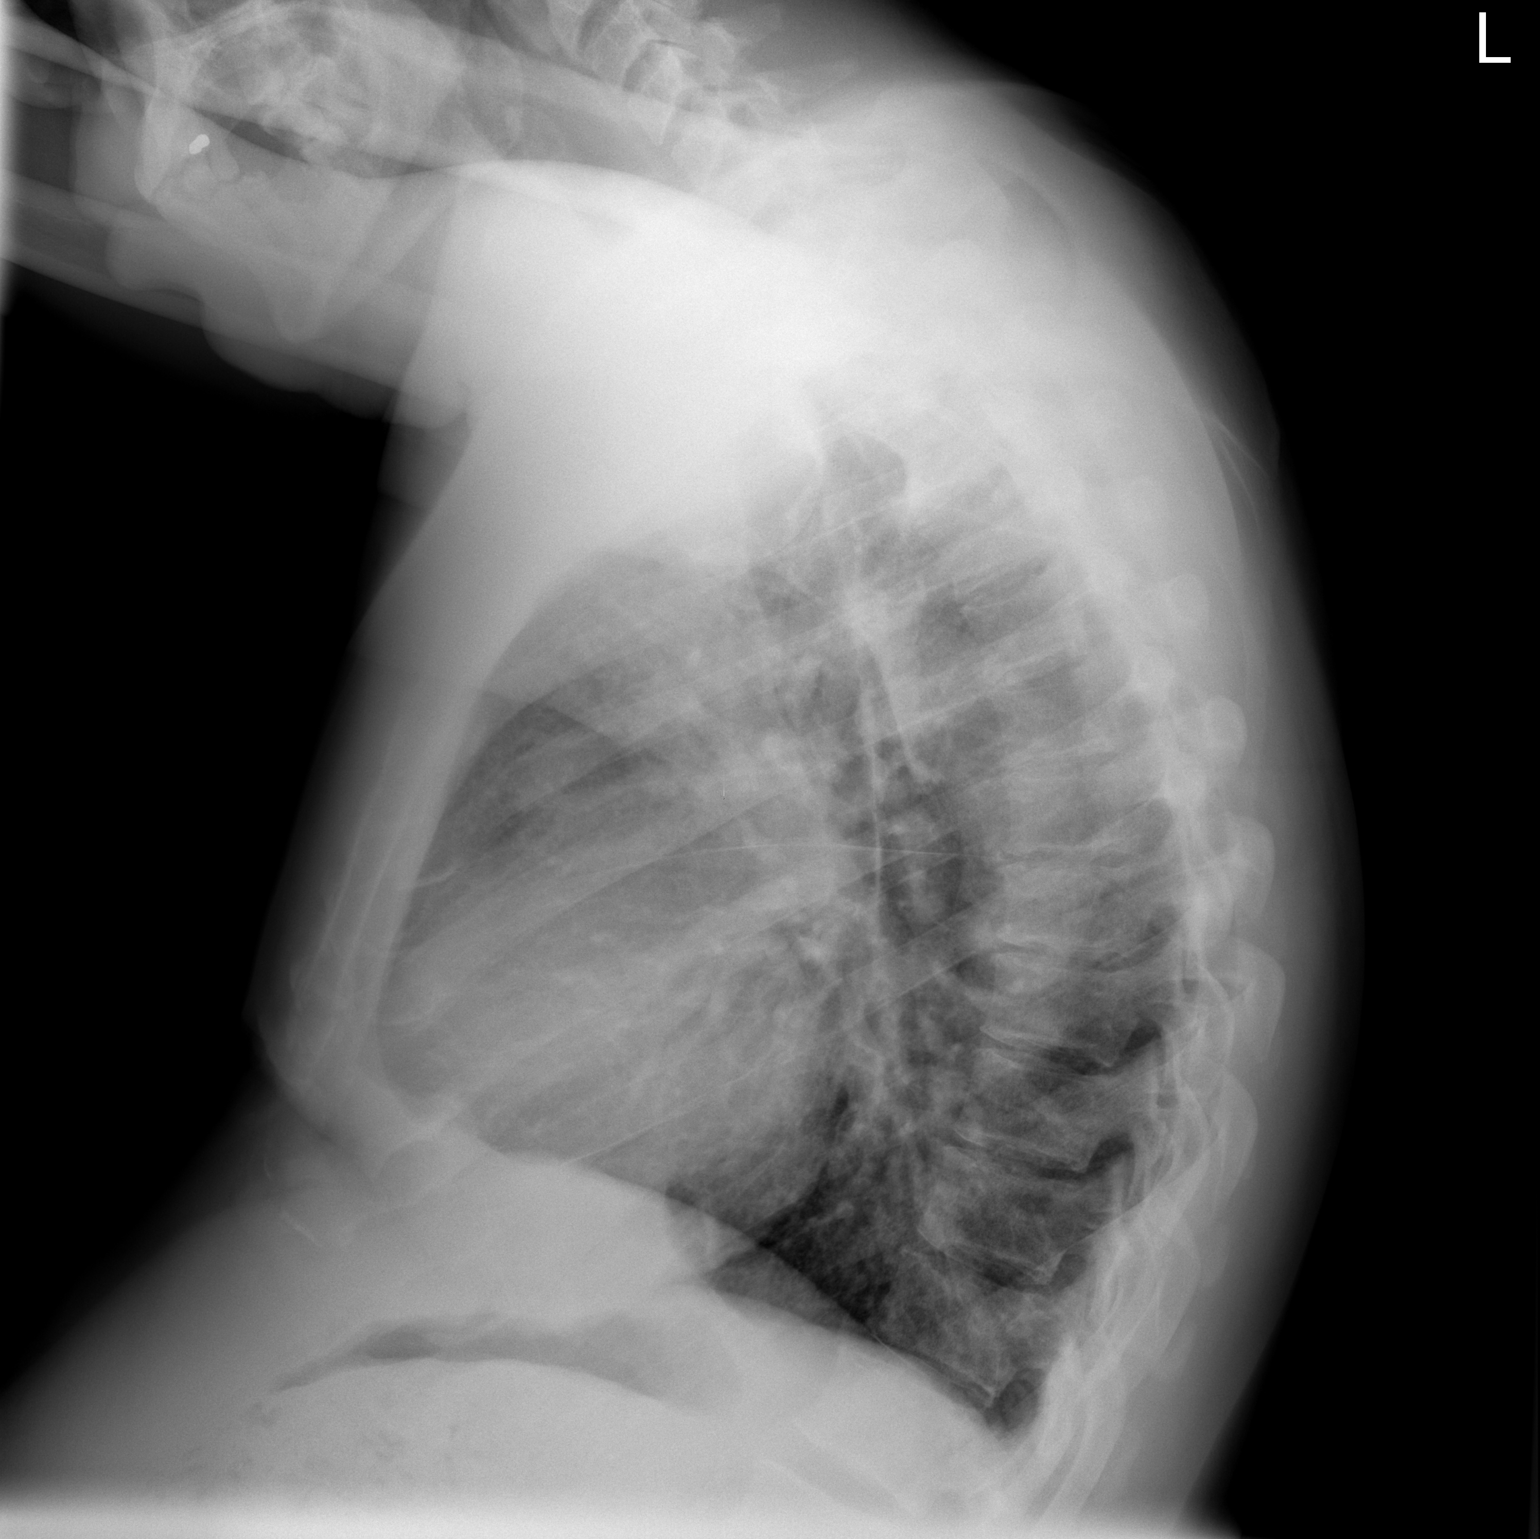

[2 of 2 positions shown; findings below may reference images not displayed]

FINDINGS: Mild cardiac enlargement.
Tortuous thoracic aorta.
Minimal pulmonary vascular congestion.
Bronchitic changes with accentuated perihilar markings, question
related to minimal chronic failure.
Appearance is improved since 05/18/2008 exam.
No pleural effusion or pneumothorax.
Bones unremarkable.
IMPRESSION: Cardiomegaly with pulmonary vascular congestion and question
minimal chronic failure

## 2009-05-12 ENCOUNTER — Emergency Department (HOSPITAL_COMMUNITY): Admission: EM | Admit: 2009-05-12 | Discharge: 2009-05-13 | Payer: Self-pay | Admitting: Emergency Medicine

## 2009-05-14 ENCOUNTER — Emergency Department (HOSPITAL_COMMUNITY): Admission: EM | Admit: 2009-05-14 | Discharge: 2009-05-15 | Payer: Self-pay | Admitting: Emergency Medicine

## 2009-05-17 ENCOUNTER — Emergency Department (HOSPITAL_COMMUNITY): Admission: EM | Admit: 2009-05-17 | Discharge: 2009-05-17 | Payer: Self-pay | Admitting: Emergency Medicine

## 2009-05-22 ENCOUNTER — Emergency Department (HOSPITAL_COMMUNITY): Admission: EM | Admit: 2009-05-22 | Discharge: 2009-05-22 | Payer: Self-pay | Admitting: Emergency Medicine

## 2009-07-01 IMAGING — CT CT ABDOMEN W/O CM
2 of 4 series · 17 of 46 positions shown, 19 images · non-contrast
Comparison: None

CT ABDOMEN

CLINICAL DATA: Abdominal pain and renal insufficiency

CT ABDOMEN AND PELVIS WITHOUT CONTRAST
TECHNIQUE: Multidetector CT imaging of the abdomen and pelvis was
performed following the standard protocol without intravenous
contrast.

[Series 2: stone_wo 5.0 b40f st · axial · 0.71mm/px · z∈[+578,+963]mm · 14 of 85 slices shown, 16 images]
[im 4/85  soft-tissue]
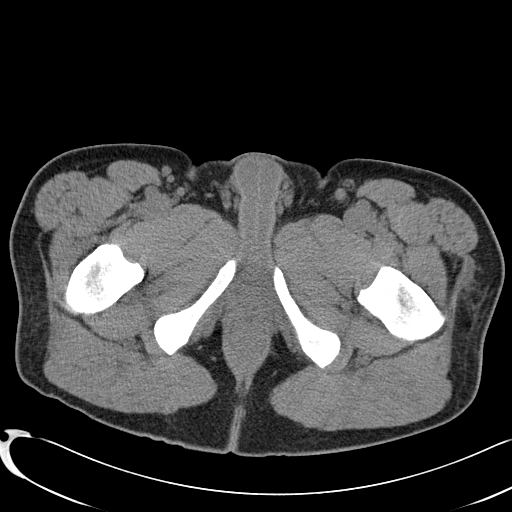
[im 4/85  bone]
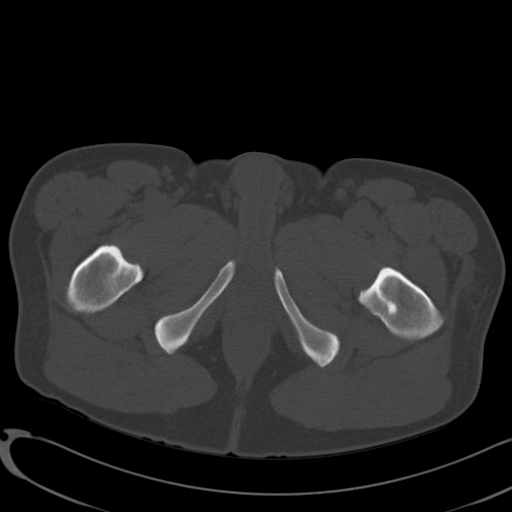
[im 10/85  soft-tissue]
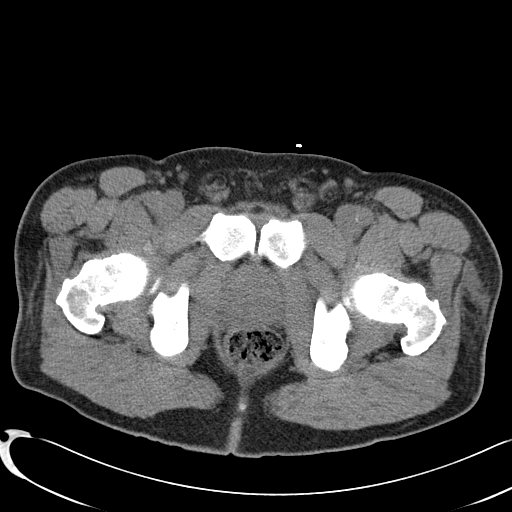
[im 17/85  soft-tissue]
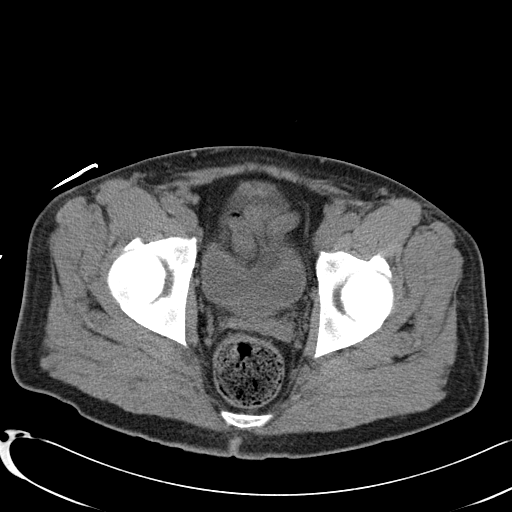
[im 23/85  soft-tissue]
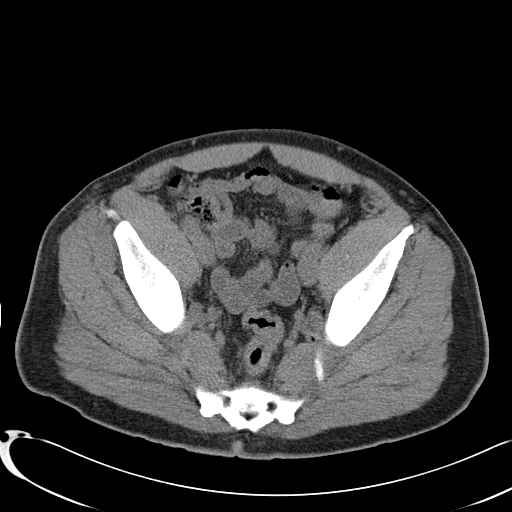
[im 30/85  soft-tissue]
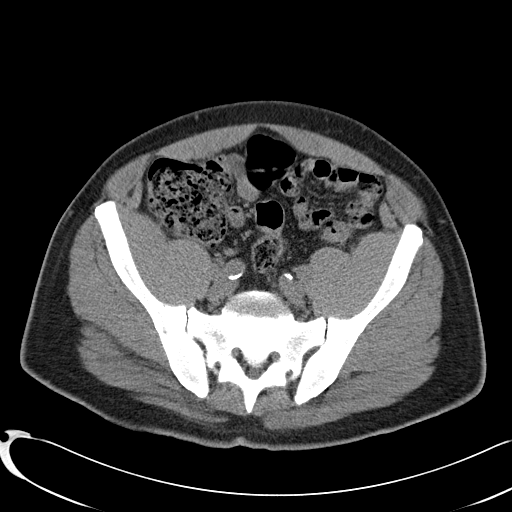
[im 33/85  soft-tissue]
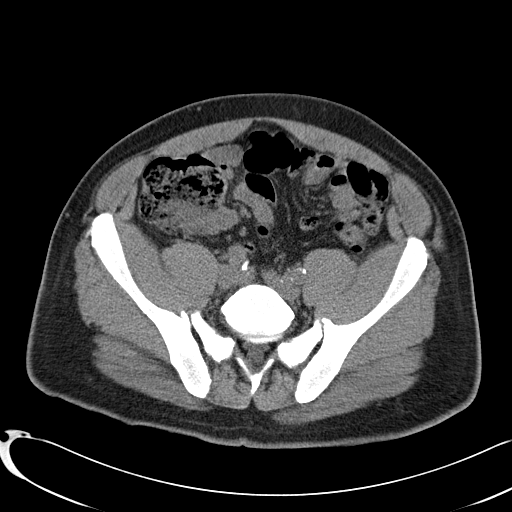
[im 39/85  soft-tissue]
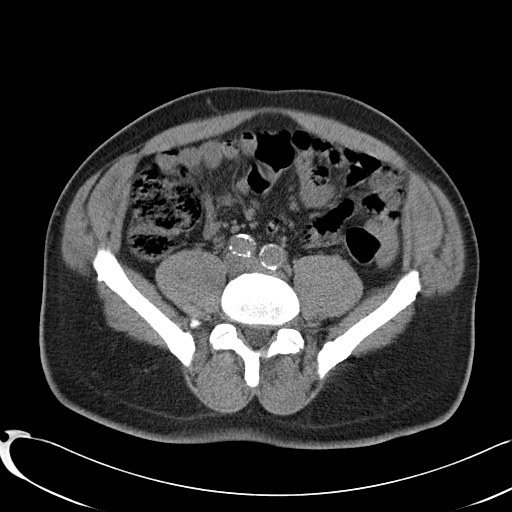
[im 46/85  soft-tissue]
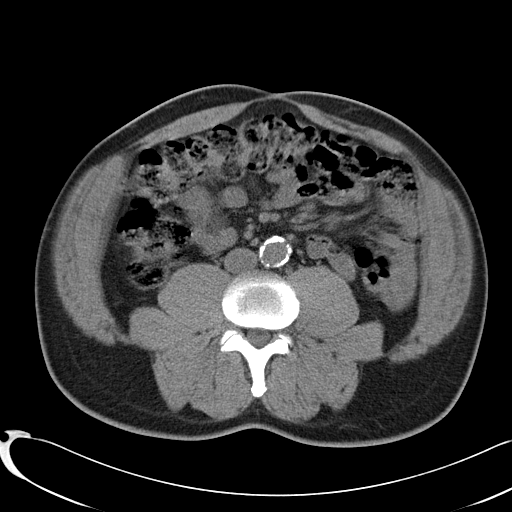
[im 52/85  soft-tissue]
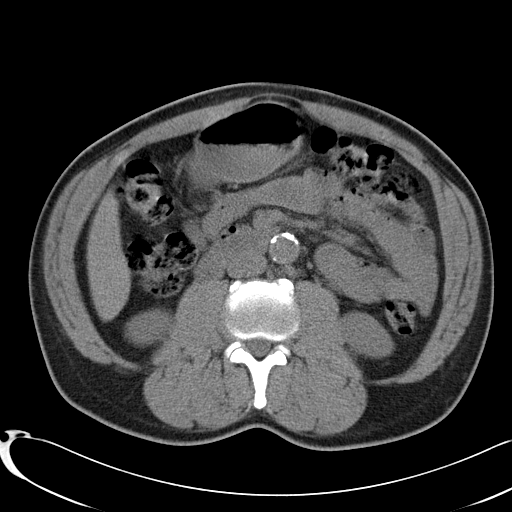
[im 52/85  bone]
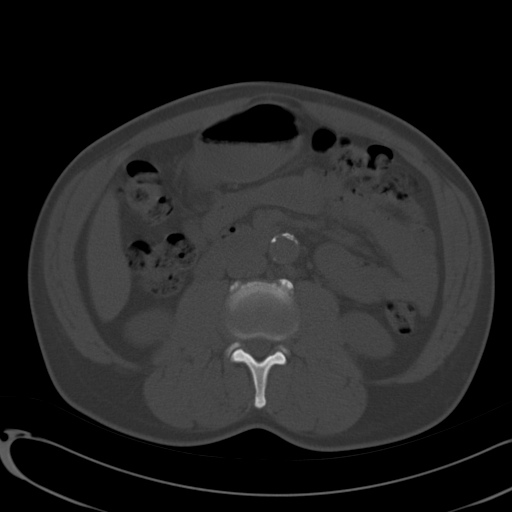
[im 55/85  soft-tissue]
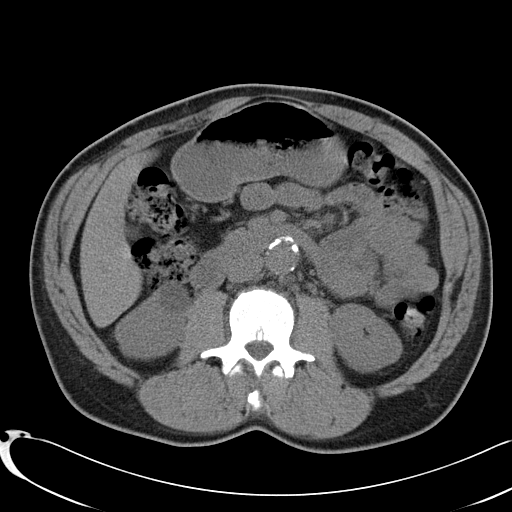
[im 62/85  soft-tissue]
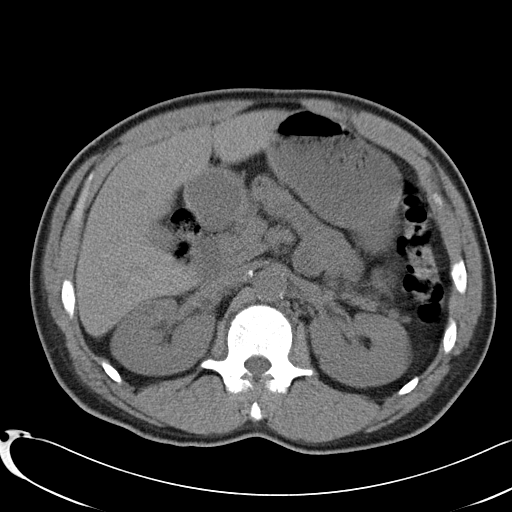
[im 68/85  soft-tissue]
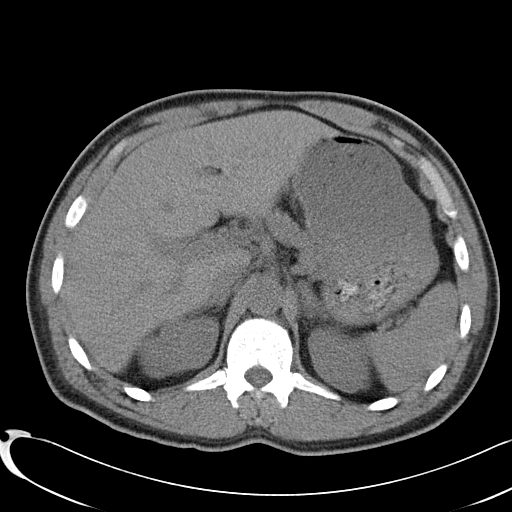
[im 75/85  soft-tissue]
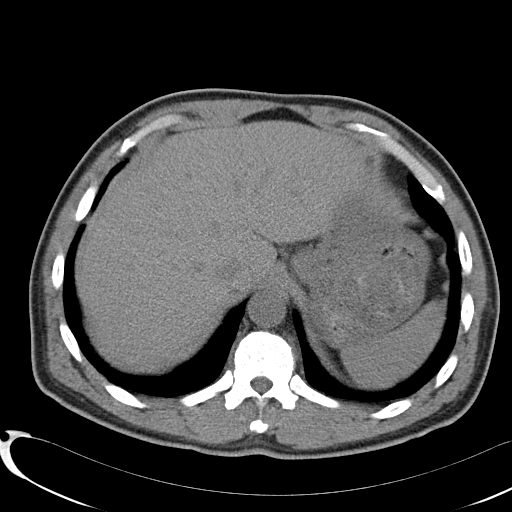
[im 81/85  soft-tissue]
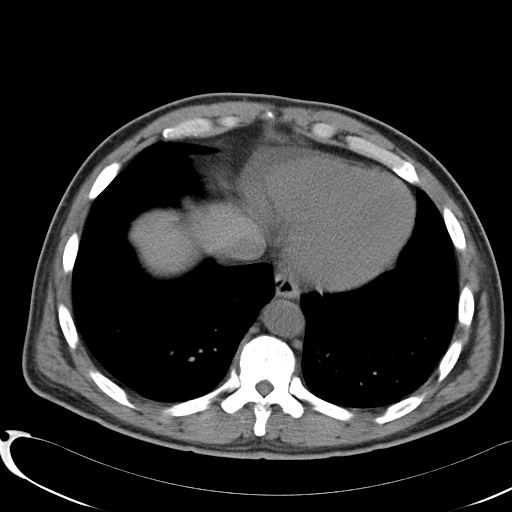

[Series 602: coronal · coronal · 0.87mm/px · 3 of 74 slices shown]
[im 25/74  soft-tissue]
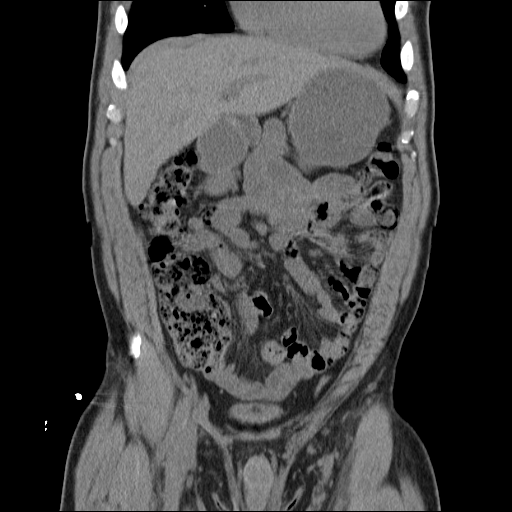
[im 33/74  soft-tissue]
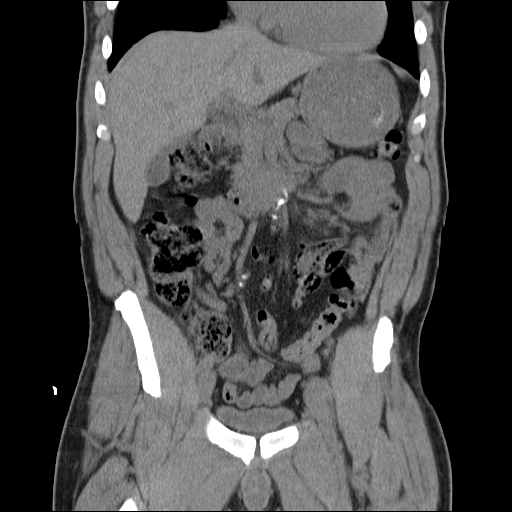
[im 41/74  soft-tissue]
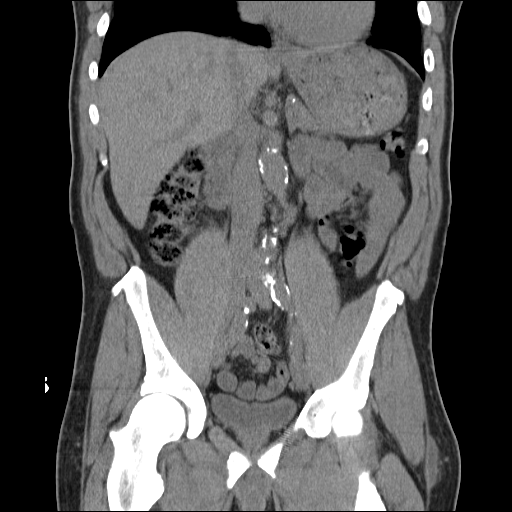

[17 of 46 positions shown; findings below may reference images not displayed]

FINDINGS: The liver, gallbladder, spleen, pancreas, adrenal glands
are within normal limits.  Small simple cyst in the right kidney is
noted on image 25.  Simple cyst in the lower pole of the right
kidney on image 30.  No free fluid.  No obvious abnormal
adenopathy.
IMPRESSION: No acute intra-abdominal pathology.

CT PELVIS
FINDINGS: Bladder and prostate are within normal limits.  No free
fluid.  No obvious abnormal adenopathy.  Normal appendix.
IMPRESSION: No acute intrapelvic pathology.

## 2009-07-05 IMAGING — CR DG CHEST 2V
2 series · 2 of 2 positions shown · non-contrast
Comparison: 10/21/2008

CLINICAL DATA: Abdominal pain.

CHEST - 2 VIEW

[w chest pa]
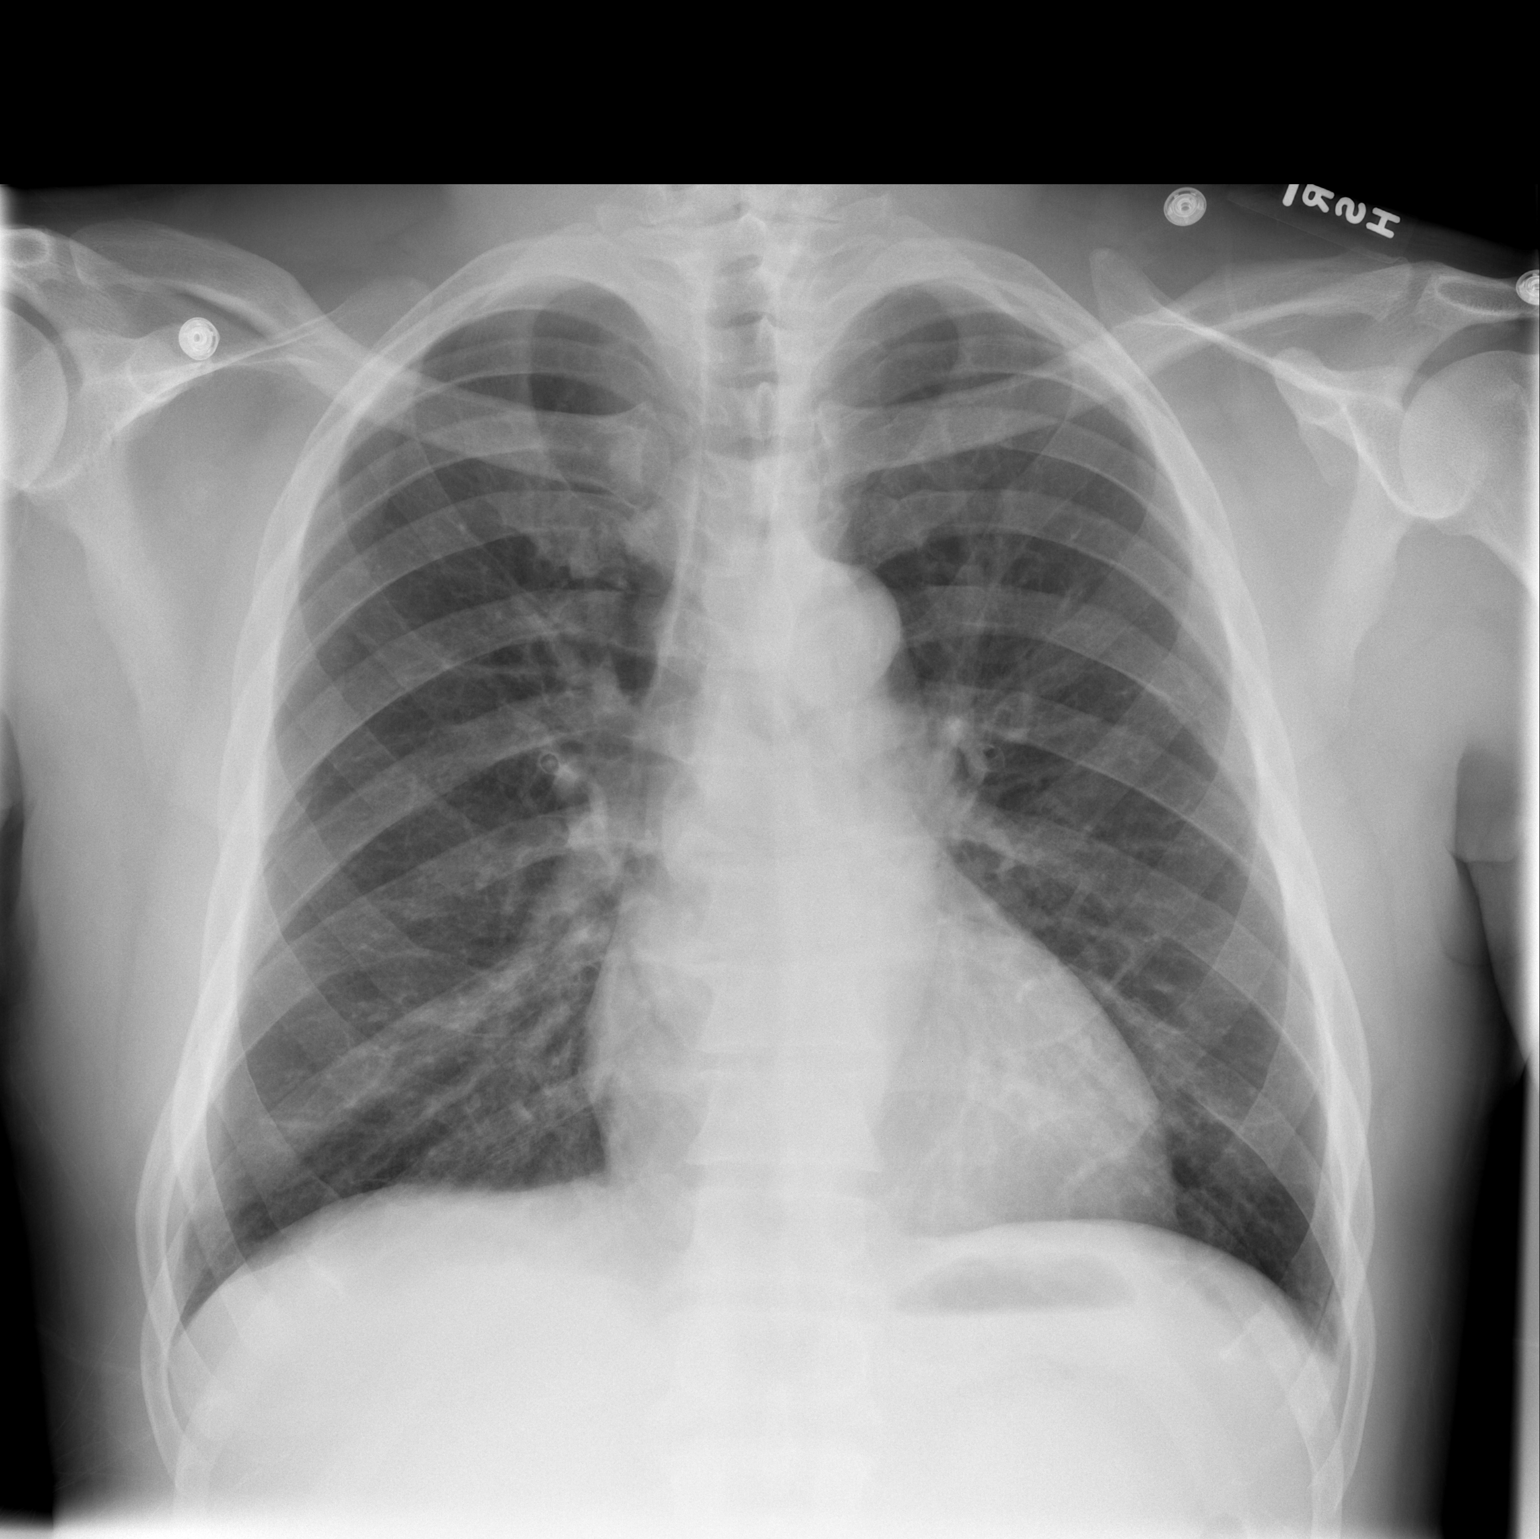

[w chest lat]
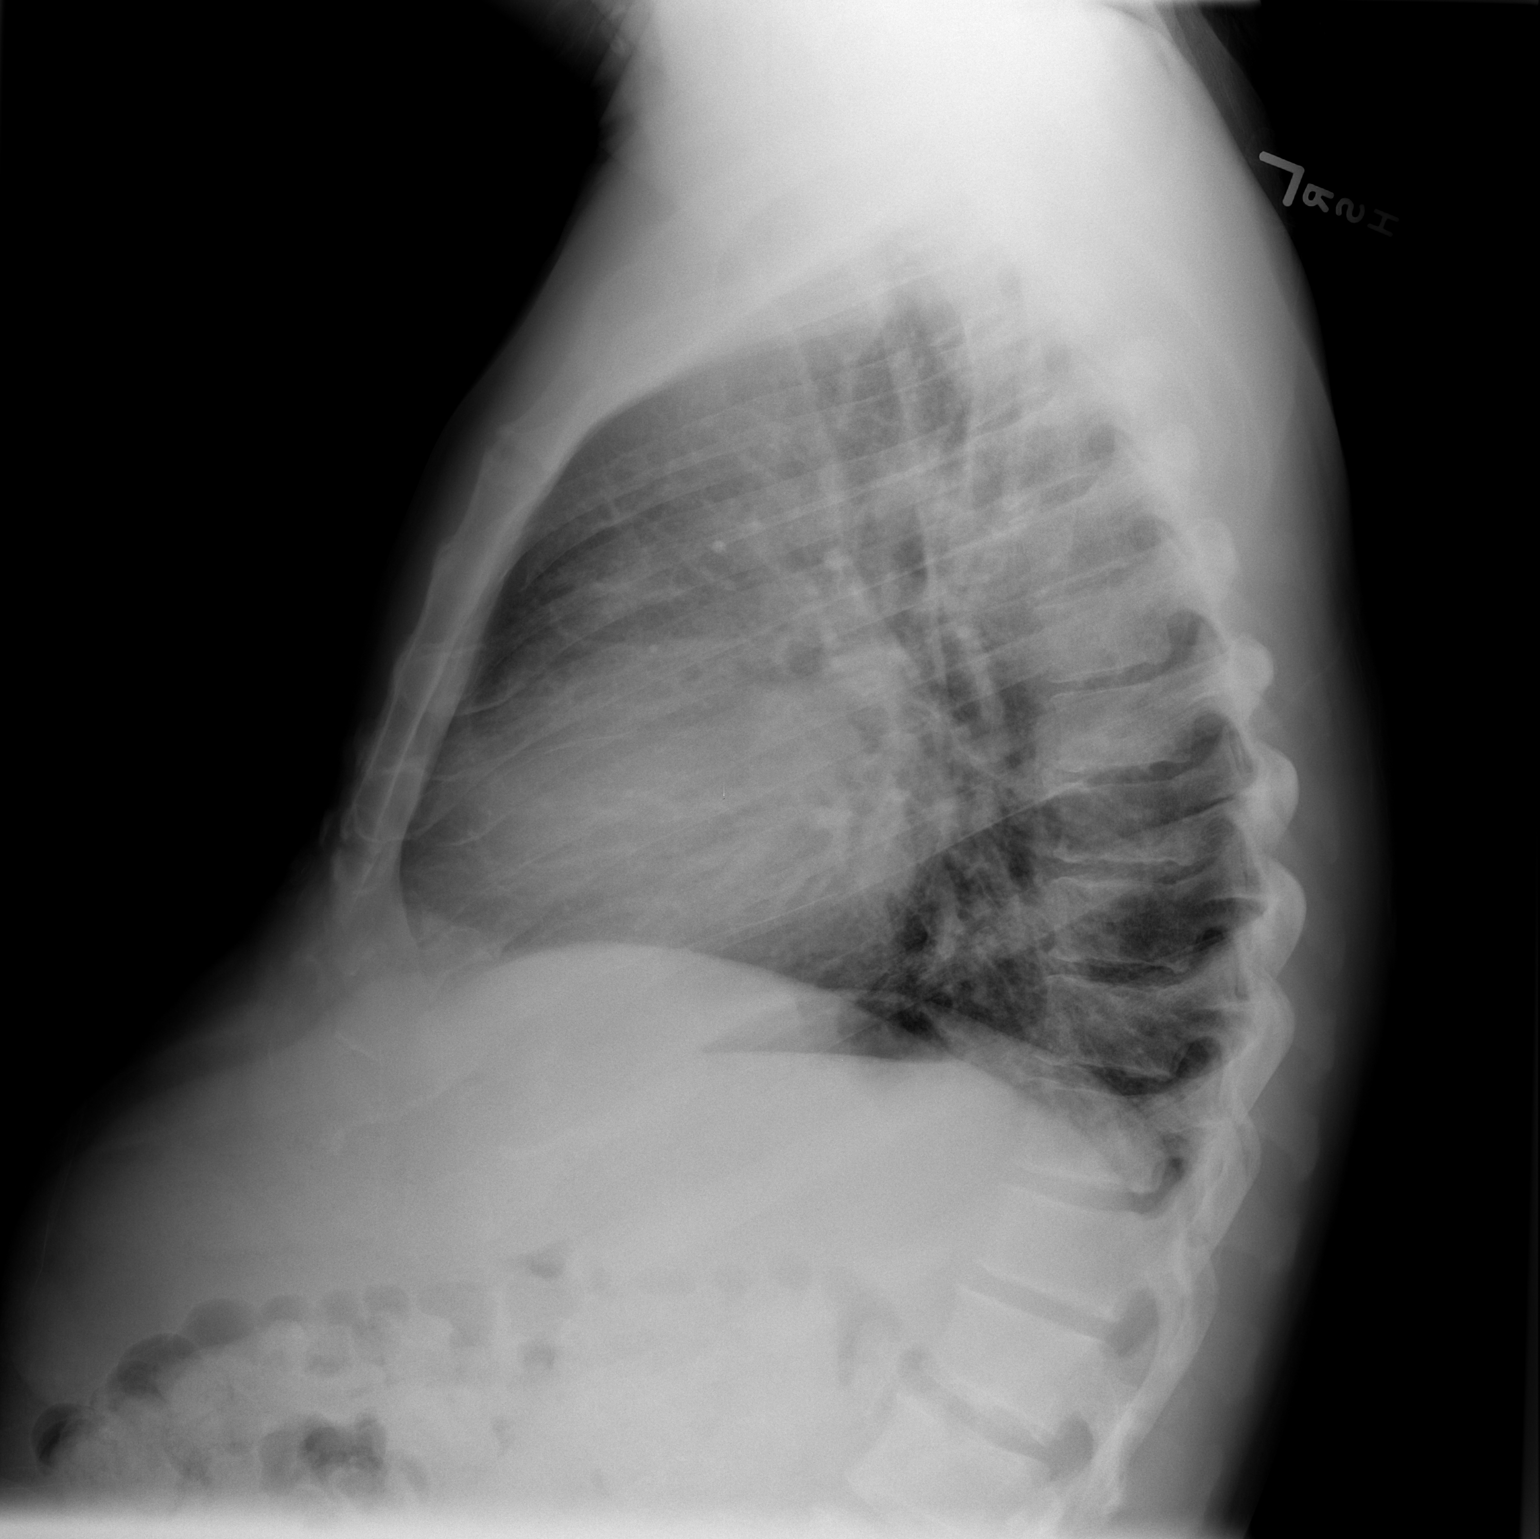

[2 of 2 positions shown; findings below may reference images not displayed]

FINDINGS: Cardiac size upper normal.  No CHF for acute lung
process.
IMPRESSION: No acute chest findings.

## 2009-11-23 IMAGING — CT CT HEAD W/O CM
1 of 2 series · 14 of 30 positions shown, 18 images · non-contrast
Comparison: 10/13/2008

CLINICAL DATA: Altered mental status

CT HEAD WITHOUT CONTRAST
TECHNIQUE: Contiguous axial images were obtained from the base of
the skull through the vertex without contrast.

[Series 2: brain · axial · 0.49mm/px · z∈[+107,+223]mm · 14 of 48 slices shown, 18 images]
[im 4/48  brain]
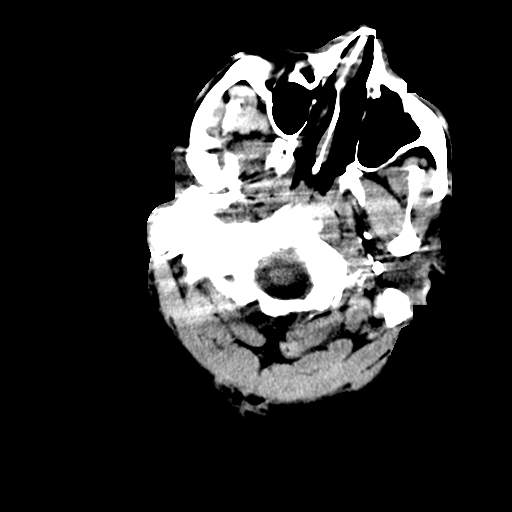
[im 4/48  bone]
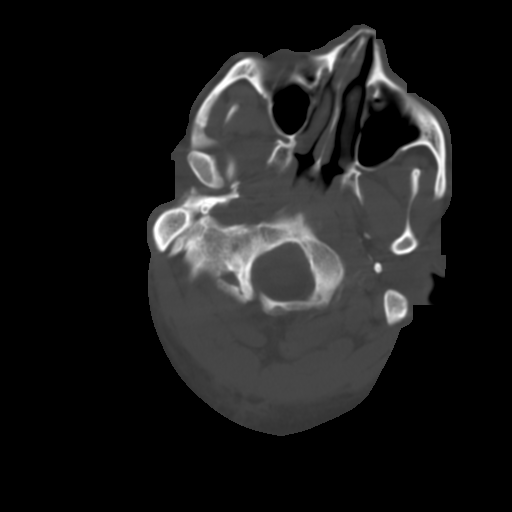
[im 7/48  brain]
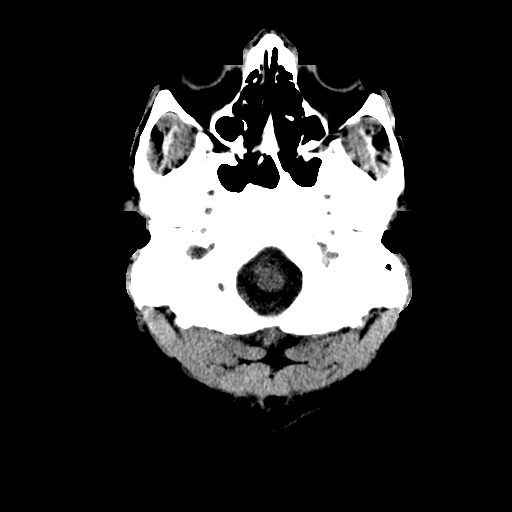
[im 10/48  brain]
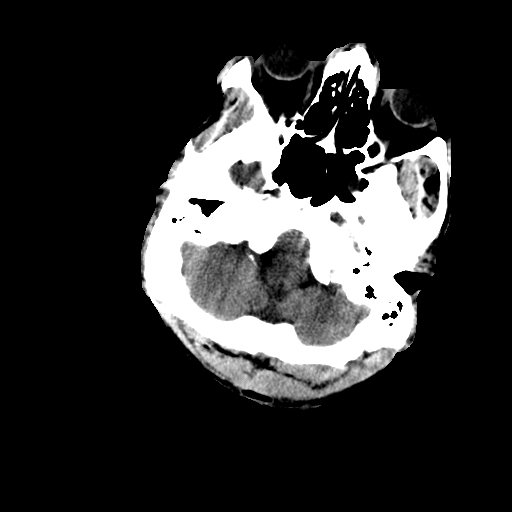
[im 13/48  brain]
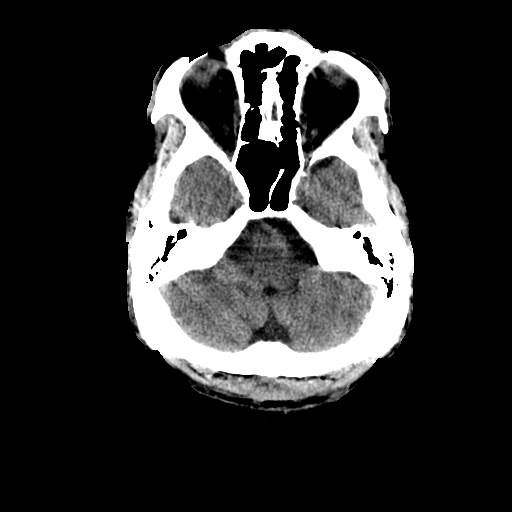
[im 16/48  brain]
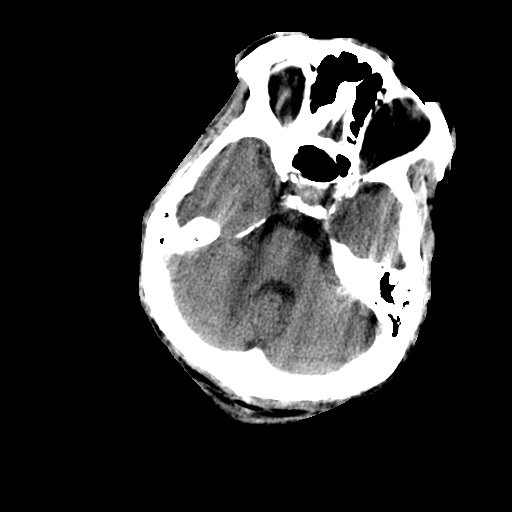
[im 16/48  bone]
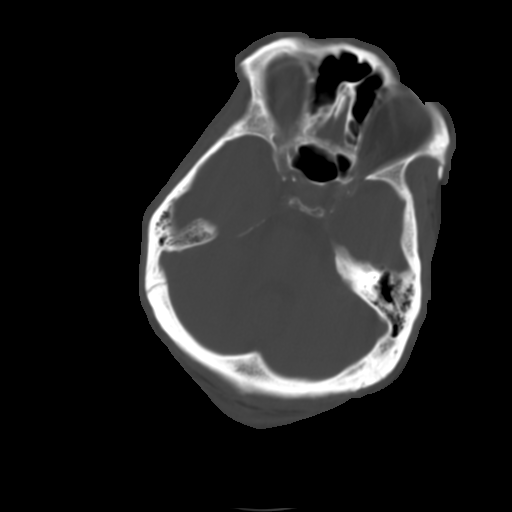
[im 19/48  brain]
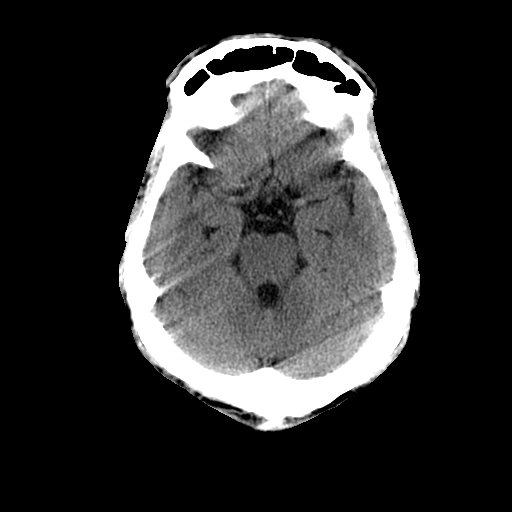
[im 22/48  brain]
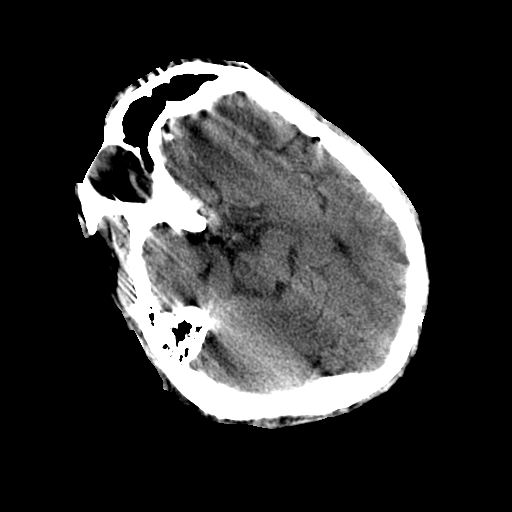
[im 26/48  brain]
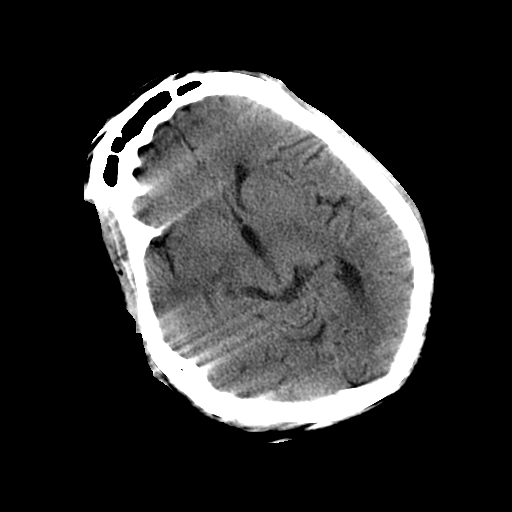
[im 29/48  brain]
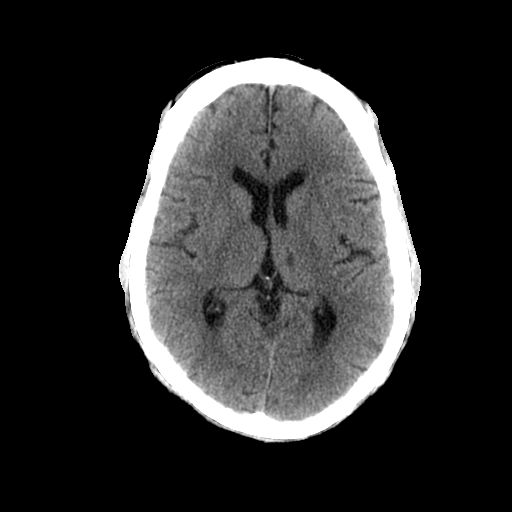
[im 29/48  bone]
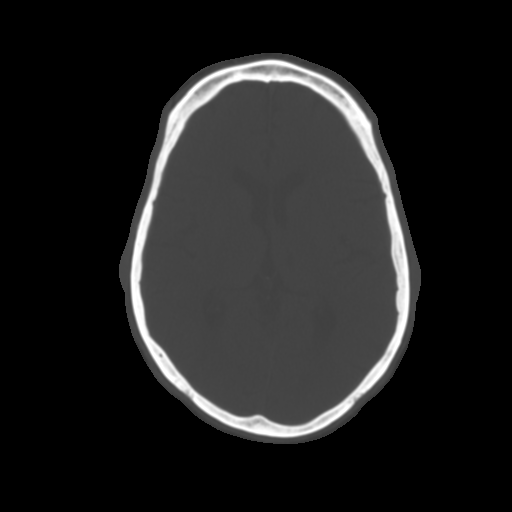
[im 32/48  brain]
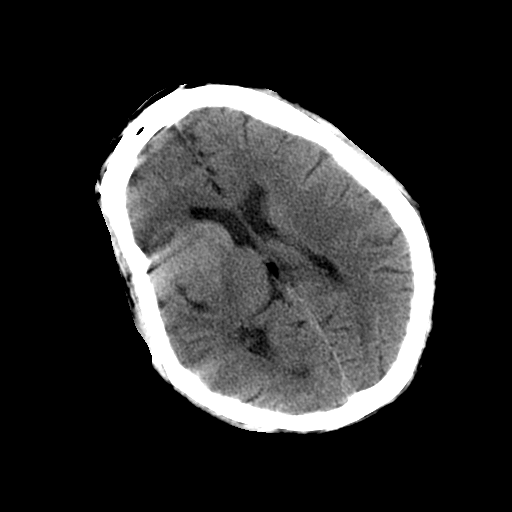
[im 35/48  brain]
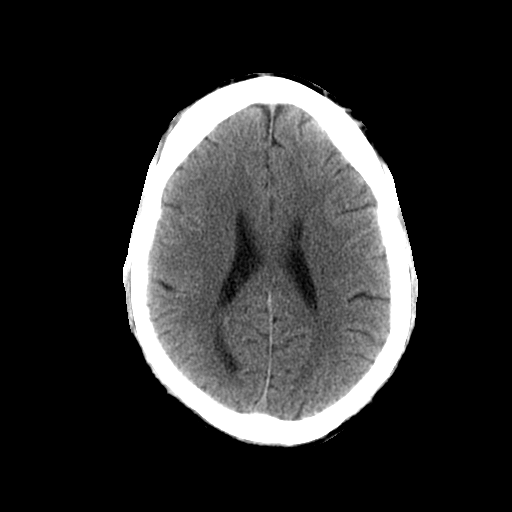
[im 38/48  brain]
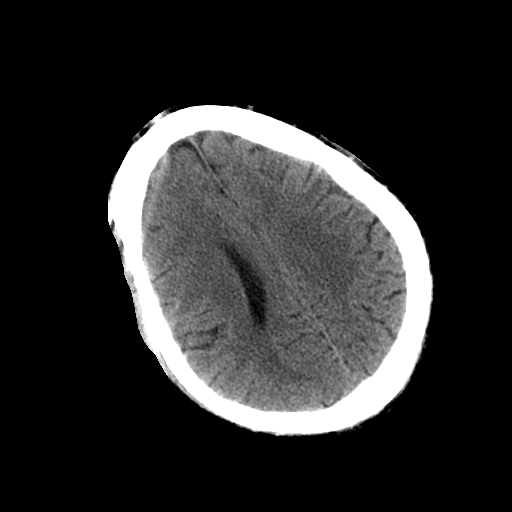
[im 41/48  brain]
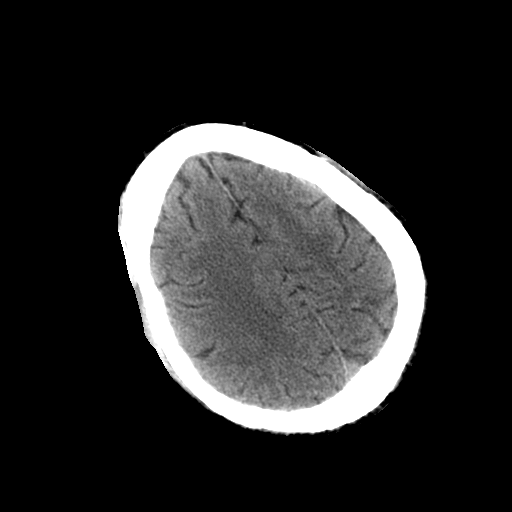
[im 41/48  bone]
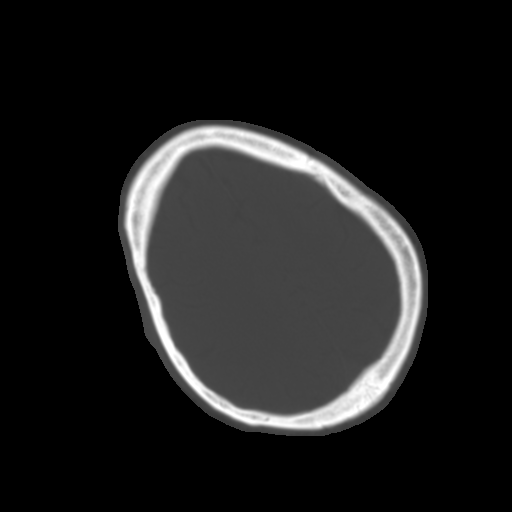
[im 44/48  brain]
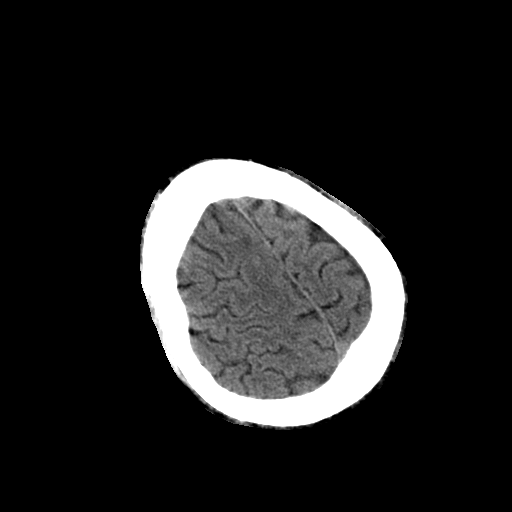

[14 of 30 positions shown; findings below may reference images not displayed]

FINDINGS: Motion artifact degrades imaging.  Left thalamic lacunar
infarct is stable.  No acute hemorrhage, acute infarction, or mass
lesion is identified.  Ventricles are normal in size.  Previously
seen fat density midline lesion is not as well seen as on the prior
study due to motion artifact but appears grossly unchanged.  On
some images, there appears to be right frontal scalp swelling.  No
underlying skull fracture.
IMPRESSION: Allowing for extensive patient motion, no acute intracranial
finding.

## 2010-10-03 LAB — POCT I-STAT, CHEM 8
BUN: 15 mg/dL (ref 6–23)
Calcium, Ion: 1.24 mmol/L (ref 1.12–1.32)
Creatinine, Ser: 0.8 mg/dL (ref 0.4–1.5)
Hemoglobin: 12.2 g/dL — ABNORMAL LOW (ref 13.0–17.0)
TCO2: 25 mmol/L (ref 0–100)

## 2010-10-03 LAB — DIFFERENTIAL
Basophils Absolute: 0 10*3/uL (ref 0.0–0.1)
Basophils Relative: 1 % (ref 0–1)
Eosinophils Absolute: 0.1 10*3/uL (ref 0.0–0.7)
Eosinophils Absolute: 0.1 10*3/uL (ref 0.0–0.7)
Eosinophils Absolute: 0.1 10*3/uL (ref 0.0–0.7)
Eosinophils Relative: 2 % (ref 0–5)
Eosinophils Relative: 2 % (ref 0–5)
Lymphocytes Relative: 19 % (ref 12–46)
Lymphocytes Relative: 24 % (ref 12–46)
Lymphs Abs: 1.3 10*3/uL (ref 0.7–4.0)
Lymphs Abs: 1.1 10*3/uL (ref 0.7–4.0)
Monocytes Absolute: 0.3 10*3/uL (ref 0.1–1.0)
Monocytes Relative: 6 % (ref 3–12)
Monocytes Relative: 12 % (ref 3–12)
Neutrophils Relative %: 54 % (ref 43–77)

## 2010-10-03 LAB — CBC
HCT: 35 % — ABNORMAL LOW (ref 39.0–52.0)
HCT: 33.5 % — ABNORMAL LOW (ref 39.0–52.0)
Hemoglobin: 11.2 g/dL — ABNORMAL LOW (ref 13.0–17.0)
MCHC: 33.7 g/dL (ref 30.0–36.0)
MCV: 90.8 fL (ref 78.0–100.0)
MCV: 89 fL (ref 78.0–100.0)
MCV: 90.4 fL (ref 78.0–100.0)
Platelets: 180 10*3/uL (ref 150–400)
Platelets: 138 10*3/uL — ABNORMAL LOW (ref 150–400)
RBC: 3.86 MIL/uL — ABNORMAL LOW (ref 4.22–5.81)
RDW: 12.4 % (ref 11.5–15.5)
RDW: 12.9 % (ref 11.5–15.5)
WBC: 6.6 10*3/uL (ref 4.0–10.5)
WBC: 4.7 10*3/uL (ref 4.0–10.5)

## 2010-10-03 LAB — BASIC METABOLIC PANEL
BUN: 13 mg/dL (ref 6–23)
CO2: 27 mEq/L (ref 19–32)
CO2: 28 mEq/L (ref 19–32)
Calcium: 9 mg/dL (ref 8.4–10.5)
Chloride: 107 mEq/L (ref 96–112)
Chloride: 107 mEq/L (ref 96–112)
Creatinine, Ser: 0.91 mg/dL (ref 0.4–1.5)
Creatinine, Ser: 1.02 mg/dL (ref 0.4–1.5)
GFR calc non Af Amer: >60 mL/min (ref >60)
Glucose, Bld: 143 mg/dL — ABNORMAL HIGH (ref 70–99)
Glucose, Bld: 101 mg/dL — ABNORMAL HIGH (ref 70–99)
Potassium: 3.5 mEq/L (ref 3.5–5.1)

## 2010-10-03 LAB — RAPID URINE DRUG SCREEN, HOSP PERFORMED
Barbiturates: NOT DETECTED
Benzodiazepines: NOT DETECTED
Cocaine: POSITIVE — AB
Opiates: NOT DETECTED
Opiates: NOT DETECTED
Tetrahydrocannabinol: NOT DETECTED

## 2010-10-03 LAB — GLUCOSE, CAPILLARY: Glucose-Capillary: 151 mg/dL — ABNORMAL HIGH (ref 70–99)

## 2010-10-03 LAB — URINALYSIS, ROUTINE W REFLEX MICROSCOPIC
Bilirubin Urine: NEGATIVE
Leukocytes, UA: NEGATIVE
Nitrite: NEGATIVE
Specific Gravity, Urine: 1.03 (ref 1.005–1.030)
pH: 6 (ref 5.0–8.0)

## 2010-10-03 LAB — URINE MICROSCOPIC-ADD ON

## 2010-10-03 LAB — ETHANOL
Alcohol, Ethyl (B): <5 mg/dL (ref 0–10)
Alcohol, Ethyl (B): <5 mg/dL (ref 0–10)

## 2010-10-03 LAB — ACETAMINOPHEN LEVEL: Acetaminophen (Tylenol), Serum: <10 ug/mL — ABNORMAL LOW (ref 10–30)

## 2010-10-04 LAB — URINALYSIS, ROUTINE W REFLEX MICROSCOPIC
Bilirubin Urine: NEGATIVE
Ketones, ur: NEGATIVE mg/dL
Nitrite: NEGATIVE
Urobilinogen, UA: 1 mg/dL (ref 0.0–1.0)

## 2010-10-04 LAB — RAPID URINE DRUG SCREEN, HOSP PERFORMED
Amphetamines: NOT DETECTED
Amphetamines: NOT DETECTED
Barbiturates: NOT DETECTED
Barbiturates: NOT DETECTED
Benzodiazepines: NOT DETECTED
Cocaine: POSITIVE — AB
Opiates: NOT DETECTED
Tetrahydrocannabinol: NOT DETECTED

## 2010-10-04 LAB — DIFFERENTIAL
Basophils Absolute: 0 10*3/uL (ref 0.0–0.1)
Basophils Absolute: 0 K/uL (ref 0.0–0.1)
Basophils Relative: 1 % (ref 0–1)
Basophils Relative: 1 % (ref 0–1)
Eosinophils Absolute: 0 K/uL (ref 0.0–0.7)
Eosinophils Relative: 2 % (ref 0–5)
Eosinophils Relative: 0 % (ref 0–5)
Lymphocytes Relative: 27 % (ref 12–46)
Lymphs Abs: 1.2 K/uL (ref 0.7–4.0)
Monocytes Absolute: 0.4 10*3/uL (ref 0.1–1.0)
Monocytes Absolute: 0.4 K/uL (ref 0.1–1.0)
Monocytes Relative: 9 % (ref 3–12)
Neutro Abs: 2.8 K/uL (ref 1.7–7.7)
Neutrophils Relative %: 63 % (ref 43–77)

## 2010-10-04 LAB — CBC
HCT: 36.2 % — ABNORMAL LOW (ref 39.0–52.0)
Hemoglobin: 11.7 g/dL — ABNORMAL LOW (ref 13.0–17.0)
MCHC: 32.3 g/dL (ref 30.0–36.0)
MCHC: 33 g/dL (ref 30.0–36.0)
MCV: 89.9 fL (ref 78.0–100.0)
Platelets: 159 K/uL (ref 150–400)
Platelets: 165 10*3/uL (ref 150–400)
RBC: 4.03 MIL/uL — ABNORMAL LOW (ref 4.22–5.81)
RBC: 4.01 MIL/uL — ABNORMAL LOW (ref 4.22–5.81)
RDW: 13.6 % (ref 11.5–15.5)
WBC: 5.6 K/uL (ref 4.0–10.5)

## 2010-10-04 LAB — POCT I-STAT, CHEM 8
BUN: 10 mg/dL (ref 6–23)
Calcium, Ion: 1.2 mmol/L (ref 1.12–1.32)
Chloride: 103 meq/L (ref 96–112)
Creatinine, Ser: 1 mg/dL (ref 0.4–1.5)
Glucose, Bld: 128 mg/dL — ABNORMAL HIGH (ref 70–99)
HCT: 39 % (ref 39.0–52.0)
Hemoglobin: 13.3 g/dL (ref 13.0–17.0)
Potassium: 3.3 meq/L — ABNORMAL LOW (ref 3.5–5.1)
Sodium: 142 meq/L (ref 135–145)
TCO2: 29 mmol/L (ref 0–100)

## 2010-10-04 LAB — GLUCOSE, CAPILLARY
Glucose-Capillary: 90 mg/dL (ref 70–99)
Glucose-Capillary: 149 mg/dL — ABNORMAL HIGH (ref 70–99)

## 2010-10-04 LAB — BASIC METABOLIC PANEL
CO2: 30 mEq/L (ref 19–32)
Glucose, Bld: 121 mg/dL — ABNORMAL HIGH (ref 70–99)
Potassium: 3.5 mEq/L (ref 3.5–5.1)
Sodium: 142 mEq/L (ref 135–145)

## 2010-10-05 LAB — URINALYSIS, ROUTINE W REFLEX MICROSCOPIC
Nitrite: NEGATIVE
Protein, ur: NEGATIVE mg/dL
Specific Gravity, Urine: 1.014 (ref 1.005–1.030)
Urobilinogen, UA: 1 mg/dL (ref 0.0–1.0)

## 2010-10-05 LAB — POCT I-STAT, CHEM 8
BUN: 12 mg/dL (ref 6–23)
Calcium, Ion: 1.26 mmol/L (ref 1.12–1.32)
Chloride: 104 mEq/L (ref 96–112)
Glucose, Bld: 115 mg/dL — ABNORMAL HIGH (ref 70–99)
HCT: 38 % — ABNORMAL LOW (ref 39.0–52.0)
Potassium: 3.3 mEq/L — ABNORMAL LOW (ref 3.5–5.1)

## 2010-10-05 LAB — DIFFERENTIAL
Lymphocytes Relative: 29 % (ref 12–46)
Lymphs Abs: 1.2 10*3/uL (ref 0.7–4.0)
Monocytes Relative: 9 % (ref 3–12)
Neutro Abs: 2.4 10*3/uL (ref 1.7–7.7)
Neutrophils Relative %: 60 % (ref 43–77)

## 2010-10-05 LAB — RAPID URINE DRUG SCREEN, HOSP PERFORMED
Barbiturates: NOT DETECTED
Opiates: NOT DETECTED

## 2010-10-05 LAB — CBC
RBC: 4.05 MIL/uL — ABNORMAL LOW (ref 4.22–5.81)
WBC: 4.1 10*3/uL (ref 4.0–10.5)

## 2010-10-07 LAB — DIFFERENTIAL
Basophils Absolute: 0 10*3/uL (ref 0.0–0.1)
Basophils Relative: 1 % (ref 0–1)
Eosinophils Absolute: 0 10*3/uL (ref 0.0–0.7)
Eosinophils Absolute: 0.1 10*3/uL (ref 0.0–0.7)
Lymphs Abs: 1.3 10*3/uL (ref 0.7–4.0)
Monocytes Absolute: 0.5 10*3/uL (ref 0.1–1.0)
Monocytes Absolute: 0.6 10*3/uL (ref 0.1–1.0)
Neutro Abs: 3.4 10*3/uL (ref 1.7–7.7)
Neutrophils Relative %: 70 % (ref 43–77)
Neutrophils Relative %: 61 % (ref 43–77)

## 2010-10-07 LAB — RAPID URINE DRUG SCREEN, HOSP PERFORMED
Amphetamines: NOT DETECTED
Benzodiazepines: NOT DETECTED
Cocaine: POSITIVE — AB
Opiates: NOT DETECTED
Tetrahydrocannabinol: NOT DETECTED

## 2010-10-07 LAB — CBC
HCT: 31.9 % — ABNORMAL LOW (ref 39.0–52.0)
MCHC: 31.9 g/dL (ref 30.0–36.0)
MCV: 89.4 fL (ref 78.0–100.0)
Platelets: 157 10*3/uL (ref 150–400)
RDW: 14.4 % (ref 11.5–15.5)
RDW: 13.7 % (ref 11.5–15.5)

## 2010-10-07 LAB — BASIC METABOLIC PANEL
BUN: 13 mg/dL (ref 6–23)
Calcium: 9.2 mg/dL (ref 8.4–10.5)
GFR calc non Af Amer: >60 mL/min (ref >60)
Glucose, Bld: 96 mg/dL (ref 70–99)
Potassium: 3.4 mEq/L — ABNORMAL LOW (ref 3.5–5.1)

## 2010-10-07 LAB — POCT I-STAT, CHEM 8
Calcium, Ion: 1.17 mmol/L (ref 1.12–1.32)
Chloride: 109 mEq/L (ref 96–112)
HCT: 37 % — ABNORMAL LOW (ref 39.0–52.0)
Hemoglobin: 12.6 g/dL — ABNORMAL LOW (ref 13.0–17.0)
Potassium: 3.9 mEq/L (ref 3.5–5.1)

## 2010-10-08 LAB — POCT I-STAT, CHEM 8
BUN: 16 mg/dL (ref 6–23)
Calcium, Ion: 1.21 mmol/L (ref 1.12–1.32)
Chloride: 108 mEq/L (ref 96–112)
Creatinine, Ser: 2.3 mg/dL — ABNORMAL HIGH (ref 0.4–1.5)
Creatinine, Ser: 0.9 mg/dL (ref 0.4–1.5)
Glucose, Bld: 151 mg/dL — ABNORMAL HIGH (ref 70–99)
Glucose, Bld: 118 mg/dL — ABNORMAL HIGH (ref 70–99)
Hemoglobin: 13.9 g/dL (ref 13.0–17.0)
Hemoglobin: 10.9 g/dL — ABNORMAL LOW (ref 13.0–17.0)
Potassium: 3.6 mEq/L (ref 3.5–5.1)
TCO2: 26 mmol/L (ref 0–100)

## 2010-10-08 LAB — URINALYSIS, ROUTINE W REFLEX MICROSCOPIC
Nitrite: NEGATIVE
Urobilinogen, UA: 0.2 mg/dL (ref 0.0–1.0)
pH: 5 (ref 5.0–8.0)

## 2010-10-08 LAB — CULTURE, BLOOD (ROUTINE X 2)
Culture: NO GROWTH
Culture: NO GROWTH

## 2010-10-08 LAB — CBC
HCT: 33.2 % — ABNORMAL LOW (ref 39.0–52.0)
HCT: 39.4 % (ref 39.0–52.0)
Hemoglobin: 11.1 g/dL — ABNORMAL LOW (ref 13.0–17.0)
Hemoglobin: 11.3 g/dL — ABNORMAL LOW (ref 13.0–17.0)
Hemoglobin: 13 g/dL (ref 13.0–17.0)
MCHC: 32.9 g/dL (ref 30.0–36.0)
MCHC: 32.9 g/dL (ref 30.0–36.0)
MCV: 87.5 fL (ref 78.0–100.0)
MCV: 87.7 fL (ref 78.0–100.0)
MCV: 88.2 fL (ref 78.0–100.0)
RBC: 3.8 MIL/uL — ABNORMAL LOW (ref 4.22–5.81)
RBC: 3.9 MIL/uL — ABNORMAL LOW (ref 4.22–5.81)
RBC: 4.49 MIL/uL (ref 4.22–5.81)
WBC: 4.8 10*3/uL (ref 4.0–10.5)
WBC: 5.3 10*3/uL (ref 4.0–10.5)
WBC: 8.3 10*3/uL (ref 4.0–10.5)

## 2010-10-08 LAB — DIFFERENTIAL
Basophils Relative: 0 % (ref 0–1)
Eosinophils Absolute: 0 10*3/uL (ref 0.0–0.7)
Lymphs Abs: 1.7 10*3/uL (ref 0.7–4.0)
Monocytes Absolute: 0.7 10*3/uL (ref 0.1–1.0)
Monocytes Relative: 9 % (ref 3–12)
Neutrophils Relative %: 70 % (ref 43–77)

## 2010-10-08 LAB — BASIC METABOLIC PANEL
CO2: 26 mEq/L (ref 19–32)
CO2: 27 mEq/L (ref 19–32)
CO2: 28 mEq/L (ref 19–32)
Calcium: 8.7 mg/dL (ref 8.4–10.5)
Chloride: 106 mEq/L (ref 96–112)
Chloride: 112 mEq/L (ref 96–112)
Creatinine, Ser: 1.25 mg/dL (ref 0.4–1.5)
Creatinine, Ser: 2.34 mg/dL — ABNORMAL HIGH (ref 0.4–1.5)
GFR calc Af Amer: 36 mL/min — ABNORMAL LOW (ref >60)
GFR calc Af Amer: >60 mL/min (ref >60)
Glucose, Bld: 96 mg/dL (ref 70–99)
Potassium: 3.5 mEq/L (ref 3.5–5.1)
Potassium: 3.7 mEq/L (ref 3.5–5.1)
Sodium: 142 mEq/L (ref 135–145)
Sodium: 144 mEq/L (ref 135–145)

## 2010-10-08 LAB — TSH: TSH: 0.355 u[IU]/mL (ref 0.350–4.500)

## 2010-10-08 LAB — URINE CULTURE: Colony Count: 10000

## 2010-10-08 LAB — RAPID URINE DRUG SCREEN, HOSP PERFORMED
Barbiturates: NOT DETECTED
Benzodiazepines: NOT DETECTED

## 2010-10-08 LAB — URINE MICROSCOPIC-ADD ON

## 2010-10-08 LAB — ETHANOL: Alcohol, Ethyl (B): <5 mg/dL (ref 0–10)

## 2010-10-09 LAB — DIFFERENTIAL
Basophils Absolute: 0 10*3/uL (ref 0.0–0.1)
Basophils Relative: 0 % (ref 0–1)
Eosinophils Absolute: 0.1 10*3/uL (ref 0.0–0.7)
Eosinophils Relative: 2 % (ref 0–5)
Lymphocytes Relative: 24 % (ref 12–46)
Lymphocytes Relative: 33 % (ref 12–46)
Lymphs Abs: 1.9 10*3/uL (ref 0.7–4.0)
Monocytes Absolute: 0.5 10*3/uL (ref 0.1–1.0)
Monocytes Relative: 8 % (ref 3–12)
Neutro Abs: 3.2 10*3/uL (ref 1.7–7.7)
Neutrophils Relative %: 56 % (ref 43–77)

## 2010-10-09 LAB — POCT I-STAT, CHEM 8
BUN: 14 mg/dL (ref 6–23)
BUN: 12 mg/dL (ref 6–23)
Calcium, Ion: 1.24 mmol/L (ref 1.12–1.32)
Calcium, Ion: 1.22 mmol/L (ref 1.12–1.32)
Creatinine, Ser: 1.2 mg/dL (ref 0.4–1.5)
Creatinine, Ser: 1.2 mg/dL (ref 0.4–1.5)
Hemoglobin: 14.3 g/dL (ref 13.0–17.0)
Hemoglobin: 13.6 g/dL (ref 13.0–17.0)
TCO2: 26 mmol/L (ref 0–100)
TCO2: 27 mmol/L (ref 0–100)

## 2010-10-09 LAB — URINALYSIS, ROUTINE W REFLEX MICROSCOPIC
Bilirubin Urine: NEGATIVE
Glucose, UA: NEGATIVE mg/dL
Hgb urine dipstick: NEGATIVE
Protein, ur: 100 mg/dL — AB
Specific Gravity, Urine: 1.024 (ref 1.005–1.030)
Urobilinogen, UA: 0.2 mg/dL (ref 0.0–1.0)

## 2010-10-09 LAB — BASIC METABOLIC PANEL
Calcium: 9.2 mg/dL (ref 8.4–10.5)
Creatinine, Ser: 0.89 mg/dL (ref 0.4–1.5)
GFR calc Af Amer: >60 mL/min (ref >60)
GFR calc non Af Amer: >60 mL/min (ref >60)

## 2010-10-09 LAB — CBC
HCT: 39.2 % (ref 39.0–52.0)
Hemoglobin: 12.6 g/dL — ABNORMAL LOW (ref 13.0–17.0)
MCV: 87.8 fL (ref 78.0–100.0)
Platelets: 123 10*3/uL — ABNORMAL LOW (ref 150–400)
RBC: 4 MIL/uL — ABNORMAL LOW (ref 4.22–5.81)
RDW: 13 % (ref 11.5–15.5)
WBC: 5.7 10*3/uL (ref 4.0–10.5)

## 2010-10-09 LAB — GLUCOSE, CAPILLARY

## 2010-10-09 LAB — URINE MICROSCOPIC-ADD ON

## 2010-10-09 LAB — RAPID URINE DRUG SCREEN, HOSP PERFORMED
Amphetamines: NOT DETECTED
Barbiturates: NOT DETECTED
Benzodiazepines: NOT DETECTED
Cocaine: POSITIVE — AB
Opiates: NOT DETECTED
Tetrahydrocannabinol: NOT DETECTED

## 2010-10-09 LAB — ETHANOL
Alcohol, Ethyl (B): <5 mg/dL (ref 0–10)
Alcohol, Ethyl (B): 23 mg/dL — ABNORMAL HIGH (ref 0–10)
Alcohol, Ethyl (B): <5 mg/dL (ref 0–10)

## 2010-10-10 LAB — URINALYSIS, ROUTINE W REFLEX MICROSCOPIC
Bilirubin Urine: NEGATIVE
Bilirubin Urine: NEGATIVE
Hgb urine dipstick: NEGATIVE
Ketones, ur: NEGATIVE mg/dL
Ketones, ur: NEGATIVE mg/dL
Ketones, ur: 15 mg/dL — AB
Nitrite: NEGATIVE
Nitrite: NEGATIVE
Protein, ur: 30 mg/dL — AB
Specific Gravity, Urine: 1.015 (ref 1.005–1.030)
Urobilinogen, UA: 1 mg/dL (ref 0.0–1.0)
Urobilinogen, UA: 0.2 mg/dL (ref 0.0–1.0)
Urobilinogen, UA: 1 mg/dL (ref 0.0–1.0)
pH: 7 (ref 5.0–8.0)
pH: 6.5 (ref 5.0–8.0)

## 2010-10-10 LAB — DIFFERENTIAL
Basophils Absolute: 0 10*3/uL (ref 0.0–0.1)
Basophils Relative: 0 % (ref 0–1)
Eosinophils Relative: 2 % (ref 0–5)
Eosinophils Relative: 3 % (ref 0–5)
Eosinophils Relative: 2 % (ref 0–5)
Lymphocytes Relative: 23 % (ref 12–46)
Lymphocytes Relative: 22 % (ref 12–46)
Lymphs Abs: 1.4 10*3/uL (ref 0.7–4.0)
Monocytes Absolute: 0.5 10*3/uL (ref 0.1–1.0)
Monocytes Absolute: 0.5 10*3/uL (ref 0.1–1.0)
Monocytes Absolute: 0.3 10*3/uL (ref 0.1–1.0)
Monocytes Relative: 9 % (ref 3–12)
Monocytes Relative: 6 % (ref 3–12)
Neutro Abs: 3.9 10*3/uL (ref 1.7–7.7)
Neutro Abs: 4.1 10*3/uL (ref 1.7–7.7)

## 2010-10-10 LAB — COMPREHENSIVE METABOLIC PANEL
ALT: 10 U/L (ref 0–53)
AST: 21 U/L (ref 0–37)
AST: 19 U/L (ref 0–37)
Albumin: 3.6 g/dL (ref 3.5–5.2)
Albumin: 3.8 g/dL (ref 3.5–5.2)
BUN: 12 mg/dL (ref 6–23)
Calcium: 9.4 mg/dL (ref 8.4–10.5)
Calcium: 8.9 mg/dL (ref 8.4–10.5)
Calcium: 9.2 mg/dL (ref 8.4–10.5)
Chloride: 107 mEq/L (ref 96–112)
Chloride: 103 mEq/L (ref 96–112)
Creatinine, Ser: 0.78 mg/dL (ref 0.4–1.5)
Creatinine, Ser: 0.68 mg/dL (ref 0.4–1.5)
GFR calc Af Amer: >60 mL/min (ref >60)
GFR calc Af Amer: >60 mL/min (ref >60)
GFR calc Af Amer: >60 mL/min (ref >60)
Glucose, Bld: 155 mg/dL — ABNORMAL HIGH (ref 70–99)
Sodium: 143 mEq/L (ref 135–145)
Total Protein: 6 g/dL (ref 6.0–8.3)
Total Protein: 6.3 g/dL (ref 6.0–8.3)
Total Protein: 6.6 g/dL (ref 6.0–8.3)

## 2010-10-10 LAB — RAPID URINE DRUG SCREEN, HOSP PERFORMED
Amphetamines: NOT DETECTED
Amphetamines: NOT DETECTED
Barbiturates: NOT DETECTED
Barbiturates: NOT DETECTED
Benzodiazepines: NOT DETECTED
Benzodiazepines: NOT DETECTED
Cocaine: NOT DETECTED
Opiates: NOT DETECTED
Tetrahydrocannabinol: NOT DETECTED

## 2010-10-10 LAB — URINE CULTURE

## 2010-10-10 LAB — CBC
HCT: 36.3 % — ABNORMAL LOW (ref 39.0–52.0)
Hemoglobin: 12.1 g/dL — ABNORMAL LOW (ref 13.0–17.0)
MCHC: 33.2 g/dL (ref 30.0–36.0)
MCHC: 33.6 g/dL (ref 30.0–36.0)
MCHC: 33.1 g/dL (ref 30.0–36.0)
MCV: 89.7 fL (ref 78.0–100.0)
MCV: 88.9 fL (ref 78.0–100.0)
MCV: 89.3 fL (ref 78.0–100.0)
Platelets: 114 10*3/uL — ABNORMAL LOW (ref 150–400)
Platelets: 128 10*3/uL — ABNORMAL LOW (ref 150–400)
RDW: 13.8 % (ref 11.5–15.5)
RDW: 13.8 % (ref 11.5–15.5)
RDW: 13.2 % (ref 11.5–15.5)
WBC: 6.6 10*3/uL (ref 4.0–10.5)
WBC: 5.8 10*3/uL (ref 4.0–10.5)

## 2010-10-10 LAB — ETHANOL: Alcohol, Ethyl (B): <5 mg/dL (ref 0–10)

## 2010-10-10 LAB — POCT CARDIAC MARKERS
CKMB, poc: 2.4 ng/mL (ref 1.0–8.0)
CKMB, poc: 1.1 ng/mL (ref 1.0–8.0)
Myoglobin, poc: 261 ng/mL (ref 12–200)
Myoglobin, poc: 103 ng/mL (ref 12–200)
Myoglobin, poc: 79.3 ng/mL (ref 12–200)
Troponin i, poc: <0.05 ng/mL (ref 0.00–0.09)

## 2010-10-10 LAB — TRICYCLICS SCREEN, URINE
TCA Scrn: NOT DETECTED
TCA Scrn: NOT DETECTED
TCA Scrn: NOT DETECTED

## 2010-10-10 LAB — PROTIME-INR: INR: 1 (ref 0.00–1.49)

## 2010-10-10 LAB — URINE MICROSCOPIC-ADD ON

## 2010-10-10 LAB — POCT I-STAT 3, ART BLOOD GAS (G3+)
Acid-Base Excess: 2 mmol/L (ref 0.0–2.0)
O2 Saturation: 90 %
Patient temperature: 37
TCO2: 28 mmol/L (ref 0–100)

## 2010-10-10 LAB — BASIC METABOLIC PANEL
Chloride: 108 mEq/L (ref 96–112)
GFR calc Af Amer: >60 mL/min (ref >60)
Potassium: 3.4 mEq/L — ABNORMAL LOW (ref 3.5–5.1)

## 2010-10-10 LAB — SALICYLATE LEVEL: Salicylate Lvl: <4 mg/dL (ref 2.8–20.0)

## 2010-10-10 LAB — BRAIN NATRIURETIC PEPTIDE: Pro B Natriuretic peptide (BNP): 46 pg/mL (ref 0.0–100.0)

## 2010-10-10 LAB — GLUCOSE, CAPILLARY: Glucose-Capillary: 148 mg/dL — ABNORMAL HIGH (ref 70–99)

## 2010-10-11 LAB — HEPATIC FUNCTION PANEL
Albumin: 4.3 g/dL (ref 3.5–5.2)
Bilirubin, Direct: 0.2 mg/dL (ref 0.0–0.3)
Total Bilirubin: 1.3 mg/dL — ABNORMAL HIGH (ref 0.3–1.2)

## 2010-10-11 LAB — DIFFERENTIAL
Basophils Absolute: 0 10*3/uL (ref 0.0–0.1)
Basophils Absolute: 0 10*3/uL (ref 0.0–0.1)
Basophils Relative: 0 % (ref 0–1)
Basophils Relative: 0 % (ref 0–1)
Eosinophils Absolute: 0.1 10*3/uL (ref 0.0–0.7)
Eosinophils Absolute: 0 10*3/uL (ref 0.0–0.7)
Eosinophils Absolute: 0.1 10*3/uL (ref 0.0–0.7)
Eosinophils Relative: 2 % (ref 0–5)
Lymphocytes Relative: 30 % (ref 12–46)
Lymphs Abs: 1.7 10*3/uL (ref 0.7–4.0)
Monocytes Absolute: 0.7 10*3/uL (ref 0.1–1.0)
Monocytes Absolute: 0.5 10*3/uL (ref 0.1–1.0)
Monocytes Relative: 10 % (ref 3–12)
Neutro Abs: 6.2 10*3/uL (ref 1.7–7.7)
Neutrophils Relative %: 67 % (ref 43–77)
Neutrophils Relative %: 80 % — ABNORMAL HIGH (ref 43–77)

## 2010-10-11 LAB — COMPREHENSIVE METABOLIC PANEL
ALT: 13 U/L (ref 0–53)
ALT: 9 U/L (ref 0–53)
AST: 16 U/L (ref 0–37)
Albumin: 3.8 g/dL (ref 3.5–5.2)
Alkaline Phosphatase: 82 U/L (ref 39–117)
BUN: 10 mg/dL (ref 6–23)
CO2: 27 mEq/L (ref 19–32)
CO2: 30 mEq/L (ref 19–32)
Calcium: 9.6 mg/dL (ref 8.4–10.5)
Chloride: 106 mEq/L (ref 96–112)
Chloride: 107 mEq/L (ref 96–112)
Creatinine, Ser: 1.04 mg/dL (ref 0.4–1.5)
GFR calc Af Amer: >60 mL/min (ref >60)
GFR calc non Af Amer: >60 mL/min (ref >60)
Glucose, Bld: 174 mg/dL — ABNORMAL HIGH (ref 70–99)
Potassium: 3.4 mEq/L — ABNORMAL LOW (ref 3.5–5.1)
Sodium: 139 mEq/L (ref 135–145)
Sodium: 143 mEq/L (ref 135–145)
Total Bilirubin: 1.2 mg/dL (ref 0.3–1.2)
Total Protein: 6.7 g/dL (ref 6.0–8.3)

## 2010-10-11 LAB — RAPID URINE DRUG SCREEN, HOSP PERFORMED
Amphetamines: NOT DETECTED
Amphetamines: NOT DETECTED
Amphetamines: NOT DETECTED
Barbiturates: NOT DETECTED
Barbiturates: NOT DETECTED
Barbiturates: NOT DETECTED
Benzodiazepines: NOT DETECTED
Benzodiazepines: NOT DETECTED
Benzodiazepines: NOT DETECTED
Cocaine: POSITIVE — AB
Cocaine: POSITIVE — AB
Opiates: NOT DETECTED
Opiates: NOT DETECTED
Tetrahydrocannabinol: NOT DETECTED
Tetrahydrocannabinol: NOT DETECTED
Tetrahydrocannabinol: NOT DETECTED

## 2010-10-11 LAB — CBC
MCHC: 32.5 g/dL (ref 30.0–36.0)
MCHC: 32.5 g/dL (ref 30.0–36.0)
MCHC: 33.4 g/dL (ref 30.0–36.0)
MCV: 90.1 fL (ref 78.0–100.0)
MCV: 88.8 fL (ref 78.0–100.0)
MCV: 89.4 fL (ref 78.0–100.0)
Platelets: 157 10*3/uL (ref 150–400)
Platelets: 170 10*3/uL (ref 150–400)
RBC: 4.64 MIL/uL (ref 4.22–5.81)
RBC: 4.5 MIL/uL (ref 4.22–5.81)
RDW: 14 % (ref 11.5–15.5)
RDW: 14.3 % (ref 11.5–15.5)
WBC: 7.7 10*3/uL (ref 4.0–10.5)
WBC: 5.7 10*3/uL (ref 4.0–10.5)

## 2010-10-11 LAB — POCT I-STAT, CHEM 8
Creatinine, Ser: 1 mg/dL (ref 0.4–1.5)
Hemoglobin: 14.6 g/dL (ref 13.0–17.0)
Sodium: 143 mEq/L (ref 135–145)
TCO2: 28 mmol/L (ref 0–100)

## 2010-10-11 LAB — BASIC METABOLIC PANEL
CO2: 30 mEq/L (ref 19–32)
Chloride: 105 mEq/L (ref 96–112)
Creatinine, Ser: 1.1 mg/dL (ref 0.4–1.5)
GFR calc Af Amer: >60 mL/min (ref >60)
Glucose, Bld: 114 mg/dL — ABNORMAL HIGH (ref 70–99)

## 2010-10-11 LAB — URINALYSIS, ROUTINE W REFLEX MICROSCOPIC
Bilirubin Urine: NEGATIVE
Bilirubin Urine: NEGATIVE
Ketones, ur: NEGATIVE mg/dL
Nitrite: NEGATIVE
Nitrite: NEGATIVE
Protein, ur: NEGATIVE mg/dL
Specific Gravity, Urine: 1.018 (ref 1.005–1.030)
Urobilinogen, UA: 1 mg/dL (ref 0.0–1.0)
pH: 6 (ref 5.0–8.0)

## 2010-10-11 LAB — GLUCOSE, CAPILLARY: Glucose-Capillary: 130 mg/dL — ABNORMAL HIGH (ref 70–99)

## 2010-10-11 LAB — LIPASE, BLOOD
Lipase: 16 U/L (ref 11–59)
Lipase: 16 U/L (ref 11–59)

## 2010-10-11 LAB — URINE MICROSCOPIC-ADD ON

## 2010-10-11 LAB — ETHANOL: Alcohol, Ethyl (B): <5 mg/dL (ref 0–10)

## 2010-10-11 LAB — AMYLASE: Amylase: 32 U/L (ref 27–131)

## 2010-10-15 LAB — DIFFERENTIAL
Basophils Absolute: 0.1 10*3/uL (ref 0.0–0.1)
Basophils Absolute: 0 10*3/uL (ref 0.0–0.1)
Basophils Relative: 1 % (ref 0–1)
Eosinophils Absolute: 0 10*3/uL (ref 0.0–0.7)
Lymphocytes Relative: 14 % (ref 12–46)
Lymphocytes Relative: 20 % (ref 12–46)
Lymphs Abs: 1 10*3/uL (ref 0.7–4.0)
Monocytes Absolute: 0.5 10*3/uL (ref 0.1–1.0)
Monocytes Absolute: 0.4 10*3/uL (ref 0.1–1.0)
Monocytes Relative: 9 % (ref 3–12)
Neutro Abs: 4.9 10*3/uL (ref 1.7–7.7)
Neutro Abs: 3.5 10*3/uL (ref 1.7–7.7)
Neutro Abs: 4.4 10*3/uL (ref 1.7–7.7)
Neutrophils Relative %: 71 % (ref 43–77)

## 2010-10-15 LAB — RAPID URINE DRUG SCREEN, HOSP PERFORMED
Amphetamines: NOT DETECTED
Barbiturates: NOT DETECTED

## 2010-10-15 LAB — URINE MICROSCOPIC-ADD ON

## 2010-10-15 LAB — URINALYSIS, ROUTINE W REFLEX MICROSCOPIC
Bilirubin Urine: NEGATIVE
Bilirubin Urine: NEGATIVE
Glucose, UA: NEGATIVE mg/dL
Hgb urine dipstick: NEGATIVE
Hgb urine dipstick: NEGATIVE
Ketones, ur: 15 mg/dL — AB
Nitrite: NEGATIVE
Nitrite: NEGATIVE
Specific Gravity, Urine: 1.015 (ref 1.005–1.030)
Specific Gravity, Urine: 1.028 (ref 1.005–1.030)
Specific Gravity, Urine: 1.019 (ref 1.005–1.030)
Urobilinogen, UA: 1 mg/dL (ref 0.0–1.0)
pH: 7 (ref 5.0–8.0)
pH: 5.5 (ref 5.0–8.0)
pH: 6.5 (ref 5.0–8.0)

## 2010-10-15 LAB — COMPREHENSIVE METABOLIC PANEL
ALT: 21 U/L (ref 0–53)
AST: 31 U/L (ref 0–37)
Alkaline Phosphatase: 106 U/L (ref 39–117)
CO2: 28 mEq/L (ref 19–32)
Chloride: 108 mEq/L (ref 96–112)
Creatinine, Ser: 0.7 mg/dL (ref 0.4–1.5)
GFR calc Af Amer: >60 mL/min (ref >60)
GFR calc non Af Amer: >60 mL/min (ref >60)
Potassium: 3.1 mEq/L — ABNORMAL LOW (ref 3.5–5.1)
Total Bilirubin: 0.7 mg/dL (ref 0.3–1.2)

## 2010-10-15 LAB — BASIC METABOLIC PANEL
BUN: 10 mg/dL (ref 6–23)
Calcium: 9.7 mg/dL (ref 8.4–10.5)
Calcium: 9 mg/dL (ref 8.4–10.5)
Creatinine, Ser: 0.9 mg/dL (ref 0.4–1.5)
GFR calc Af Amer: >60 mL/min (ref >60)
GFR calc non Af Amer: >60 mL/min (ref >60)
GFR calc non Af Amer: >60 mL/min (ref >60)
Glucose, Bld: 116 mg/dL — ABNORMAL HIGH (ref 70–99)
Potassium: 4.1 mEq/L (ref 3.5–5.1)
Sodium: 142 mEq/L (ref 135–145)

## 2010-10-15 LAB — POCT TOXICOLOGY PANEL

## 2010-10-15 LAB — CBC
HCT: 39.3 % (ref 39.0–52.0)
Hemoglobin: 11.3 g/dL — ABNORMAL LOW (ref 13.0–17.0)
MCV: 88.7 fL (ref 78.0–100.0)
Platelets: 210 10*3/uL (ref 150–400)
RBC: 3.98 MIL/uL — ABNORMAL LOW (ref 4.22–5.81)
RBC: 4.25 MIL/uL (ref 4.22–5.81)
RDW: 14.7 % (ref 11.5–15.5)
WBC: 6 10*3/uL (ref 4.0–10.5)

## 2010-10-16 LAB — URINALYSIS, ROUTINE W REFLEX MICROSCOPIC
Bilirubin Urine: NEGATIVE
Glucose, UA: NEGATIVE mg/dL
Glucose, UA: NEGATIVE mg/dL
Hgb urine dipstick: NEGATIVE
Hgb urine dipstick: NEGATIVE
Ketones, ur: NEGATIVE mg/dL
Ketones, ur: NEGATIVE mg/dL
Leukocytes, UA: NEGATIVE
Nitrite: NEGATIVE
Protein, ur: NEGATIVE mg/dL
Protein, ur: 30 mg/dL — AB
Specific Gravity, Urine: 1.023 (ref 1.005–1.030)
Urobilinogen, UA: 1 mg/dL (ref 0.0–1.0)
pH: 6.5 (ref 5.0–8.0)

## 2010-10-16 LAB — COMPREHENSIVE METABOLIC PANEL
AST: 24 U/L (ref 0–37)
BUN: 12 mg/dL (ref 6–23)
CO2: 25 mEq/L (ref 19–32)
Chloride: 107 mEq/L (ref 96–112)
Creatinine, Ser: 0.74 mg/dL (ref 0.4–1.5)
GFR calc non Af Amer: >60 mL/min (ref >60)
Total Bilirubin: 1.2 mg/dL (ref 0.3–1.2)

## 2010-10-16 LAB — URINE MICROSCOPIC-ADD ON

## 2010-10-16 LAB — DIFFERENTIAL
Basophils Absolute: 0 10*3/uL (ref 0.0–0.1)
Basophils Relative: 1 % (ref 0–1)
Eosinophils Absolute: 0.3 10*3/uL (ref 0.0–0.7)
Eosinophils Absolute: 0.1 10*3/uL (ref 0.0–0.7)
Eosinophils Relative: 4 % (ref 0–5)
Eosinophils Relative: 2 % (ref 0–5)
Lymphocytes Relative: 25 % (ref 12–46)
Lymphocytes Relative: 21 % (ref 12–46)
Lymphs Abs: 1.8 10*3/uL (ref 0.7–4.0)
Lymphs Abs: 1.3 10*3/uL (ref 0.7–4.0)
Monocytes Absolute: 0.6 10*3/uL (ref 0.1–1.0)
Monocytes Relative: 9 % (ref 3–12)
Monocytes Relative: 8 % (ref 3–12)
Neutro Abs: 4.5 10*3/uL (ref 1.7–7.7)
Neutrophils Relative %: 61 % (ref 43–77)

## 2010-10-16 LAB — CBC
HCT: 36.4 % — ABNORMAL LOW (ref 39.0–52.0)
HCT: 39.6 % (ref 39.0–52.0)
Hemoglobin: 12 g/dL — ABNORMAL LOW (ref 13.0–17.0)
Hemoglobin: 12.9 g/dL — ABNORMAL LOW (ref 13.0–17.0)
MCHC: 32.6 g/dL (ref 30.0–36.0)
MCV: 89.4 fL (ref 78.0–100.0)
MCV: 88.8 fL (ref 78.0–100.0)
Platelets: 153 10*3/uL (ref 150–400)
RBC: 4.08 MIL/uL — ABNORMAL LOW (ref 4.22–5.81)
RBC: 4.46 MIL/uL (ref 4.22–5.81)
RDW: 15.3 % (ref 11.5–15.5)
WBC: 7.3 10*3/uL (ref 4.0–10.5)
WBC: 6.3 10*3/uL (ref 4.0–10.5)

## 2010-10-16 LAB — BASIC METABOLIC PANEL
Chloride: 109 mEq/L (ref 96–112)
GFR calc Af Amer: >60 mL/min (ref >60)
Potassium: 3.8 mEq/L (ref 3.5–5.1)
Sodium: 139 mEq/L (ref 135–145)

## 2010-10-16 LAB — RAPID URINE DRUG SCREEN, HOSP PERFORMED
Amphetamines: NOT DETECTED
Amphetamines: NOT DETECTED
Barbiturates: NOT DETECTED
Barbiturates: NOT DETECTED
Benzodiazepines: NOT DETECTED
Benzodiazepines: NOT DETECTED
Cocaine: NOT DETECTED
Cocaine: NOT DETECTED
Opiates: NOT DETECTED
Opiates: NOT DETECTED
Tetrahydrocannabinol: NOT DETECTED
Tetrahydrocannabinol: POSITIVE — AB

## 2010-10-16 LAB — GLUCOSE, CAPILLARY
Glucose-Capillary: 111 mg/dL — ABNORMAL HIGH (ref 70–99)
Glucose-Capillary: 144 mg/dL — ABNORMAL HIGH (ref 70–99)

## 2010-10-16 LAB — ETHANOL: Alcohol, Ethyl (B): <5 mg/dL (ref 0–10)

## 2010-11-13 NOTE — Consult Note (Signed)
[name], [name] NO.:  [id number]   MEDICAL RECORD NO.:  [id number]          PATIENT TYPE:  EMS   LOCATION:  ED                           FACILITY:  [facility]   PHYSICIAN:  Antonietta Breach, M.D.  DATE OF BIRTH:  06-02-1961   DATE OF CONSULTATION:  08/31/2008  DATE OF DISCHARGE:                                 CONSULTATION   REASON FOR CONSULTATION:  Agitation, alcohol dependence, history of  psychosis.   REQUESTING PHYSICIAN:  [facility] emergency physicians.   HISTORY OF PRESENT ILLNESS:  [name] is a 50 year old male  presenting to the [facility] on August 30, 2008 due to having a  delusion that there is a but in his ear as well as abdominal pain.   [name] has now dismissed with the belief that there is a but in  his ear.  He had been restarted on antipsychotic medicine in that he did  receive Geodon while in the emergency room. Also he has received Ativan  and is no longer intoxicated with alcohol.  Just prior to arriving to  the emergency room he relapsed on alcohol, drinking liquor and was  intoxicated.  He has now recovered from his intoxicated state.   He is not having any thoughts of harming himself or others.  He is not  having delusions or hallucinations.  He is socially appropriate and  cooperative (however, yesterday he was very verbally abusive as well as  agitated).  At that time he did receive Geodon and Ativan.  He has now  had a full day of support as well as some sleep.   PAST PSYCHIATRIC HISTORY:  [name] does have multiple psychotic  episodes. In review of the past medical record there is a confirmation  that he has had multiple psychotic episodes.  He also has a prior  history of long-term alcoholism.  [name] also has periods of  flight of ideas along with delusions and hallucinations.  He has had  periods of highly pressured speech and expansive affect.   He has been discharged from the [facility] behavioral  health center in the  past on 1 mg of Risperdal b.i.d. and 2 mg q.h.s. He was taking this  medication prior to his presentation.  However, he did relapse on  alcohol and skipped dosages.   He also has been taking Cogentin 0.5 mg b.i.d. for anti EPS.   FAMILY PSYCHIATRIC HISTORY:  None known.   SOCIAL HISTORY:  [name] does have a long-term history of  alcoholism.  He has arranged for a new home that he is moving into after  he leaves the hospital.   PAST MEDICAL HISTORY:  Congestive heart failure, hypertension.   ALLERGIES:  No known drug allergies.   MEDICATIONS:  The MAR is reviewed.  He has received 20 mg of Geodon.  However, he is not on any current standing psychotropic medications.   LABORATORY DATA:  WBC 7.7, hemoglobin 14.6, platelet count 157.  Urine  drug screen was negative.   REVIEW OF SYSTEMS:  Constitutional, head, eyes, ears, nose and throat,  mouth neurologic,  psychiatric, cardiovascular, respiratory unremarkable.   GASTROINTESTINAL:  His abdominal cramps have resolved.   Genitourinary, skin, musculoskeletal, hematologic lymphatic, endocrine  metabolic all unremarkable.   PHYSICAL EXAMINATION:  VITAL SIGNS:  Temperature 97.2, pulse 98,  respiratory rate 18, blood pressure 128/86, O2 saturation on room air  95%.  GENERAL APPEARANCE:  [name] is a middle-aged male sitting up in  his chair with no abnormal involuntary movements.   MENTAL STATUS EXAM:  [name] is alert.  He is oriented completely  to all spheres.  His memory function is intact to immediate, recent, and  remote.  His eye contact is good.  His affect is slightly anxious at  baseline but with a broad and appropriate range.  His mood is within  normal limits.  He is socially appropriate and cooperative.   Fund of knowledge and intelligence are normal.  His speech involves  normal rate and prosody without dysarthria.   Thought process is logical, coherent, goal directed.  No  looseness of  associations.  Thought content:  No thoughts of harming himself or  others. No delusions or hallucinations.  Insight is intact. Judgment is  intact.   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  293.83 mood disorder not otherwise specified, now stable.   [name] may have a history of schizoaffective disorder or he may  have a history of schizophrenia that has a significant anxious and  irritable component when he is acutely psychotic.   He clearly has aggravated his condition and likely precipitated  psychotic symptoms when he is abusing substances. Substance abuse also  makes him more likely to be noncompliant with his psychotropic  medications.   AXIS II:  Deferred.  AXIS III:  See past medical history.  AXIS IV:  General medical primary support group.  AXIS V:  55.   [name] is no longer at risk to harm himself or others.  He is also  no longer at risk to neglect himself now that he is recovered from his  psychotic state as well as recovered from his intoxicated state.   He declines inpatient chemical dependency treatment and he is no longer  committable now that he has recovered from his symptoms of yesterday.   He does agree to call emergency services immediately for any thoughts of  harming himself, thoughts of harming others, hallucinations or distress.   He agrees to not drive if drowsy.   RECOMMENDATIONS:  1. The indications, alternatives and adverse effects of his      psychotropic medication were reviewed with Mr. Leavell. He      understands and wants to proceed as below.  He expresses strong      motivation for continuing to be compliant with his medicines.   RECOMMENDATIONS:  1. Would restart his Risperdal at 1 mg q.a.m. and 2 mg q.h.s. He can      then increase after 2 days to 1 mg b.i.d. and 2 mg q.h.s.  2. Would restart his Cogentin at 0.5 mg b.i.d.  3. [facility] appointment for  chemical abuse.  4. [facility]      and the assessment team counselor is working with the patient to      confirm his psychiatric follow-up.  5. A 12-step group and method.  6. Mr. Hayashida is psychiatrically cleared for discharge from the      hospital.  Antonietta Breach, M.D.  Electronically Signed     JW/MEDQ  D:  09/17/2008  T:  09/18/2008  Job:  [id number]

## 2010-11-13 NOTE — H&P (Signed)
[name], [name]           ACCOUNT NO.:  [id number]   MEDICAL RECORD NO.:  [id number]          PATIENT TYPE:  IPS   LOCATION:  0402                          [facility]ILITY:  [facility]   PHYSICIAN:  Anselm Jungling, MD  DATE OF BIRTH:  [birth year]   DATE OF ADMISSION:  09/08/2008  DATE OF DISCHARGE:                       PSYCHIATRIC ADMISSION ASSESSMENT   IDENTIFYING INFORMATION:  This is a 50 year old male.  This is a  voluntary admission.   HISTORY OF THE PRESENT ILLNESS:  This one of several recent admissions  for this 50 year old with a history of schizophrenia and mood  fluctuations who was presented in the emergency room complaining that a  snake had crawled into his mouth some time ago and was biting his  stomach and living down there.  Merlyn Albert has a history of mood disorder and  psychosis and has not been taking Risperdal prescribed for him or any  other medications.  He complained of auditory hallucinations in the  emergency room, was hostile and uncooperative, refusing to give much  history.  He subsequently settled down and was able to be admitted  voluntarily.  His exam in the emergency room revealed no specific  abdominal symptoms or signs.   PAST PSYCHIATRIC HISTORY:  This is one of several [facility] admissions.  Most  recently, he has been here August 17, 2008, through August 23, 2008,  August 12, 2008, through August 14, 2008, and has had at least 4  admissions here since the end of December 2009.  He also reports being  admitted to [facility] here in [place] for 3 weeks  in January at which time they put him on lithium but he did not take it.  He says he is willing to take his Risperdal but not the lithium.   SOCIAL HISTORY:  Single male.  He says that he has a rented room here in  [place], had at one time been staying in assisted living but refused  to remain there.  He has not kept followup appointments that have been  arranged for him in the  past.  He denies being married.  No children.   FAMILY HISTORY:  Not available.   MEDICAL HISTORY:  Medical problems are hypertension.   CURRENT MEDICATIONS:  Risperdal 1 mg p.o. b.i.d. and 2 mg p.o. q.h.s.  He has previously taken clonidine 0.1 mg at bedtime and at one time took  hydrochlorothiazide 225 mg daily also.  Medication compliance is poor.   DRUG ALLERGIES:  INCLUDE HALDOL WHICH HE IS INTOLERANT OF.   PHYSICAL EXAM:  Was done in the emergency room as noted in the record.  His urine drug screen was noted to be positive for cocaine.  Alcohol  level was less than 5.  Metabolic panel unremarkable.  Random glucose  was 126, BUN 8, creatinine 1.04.   MENTAL STATUS EXAM:  Revealed a sleepy male.  He does respond to his  name, arouses easily.  Refuses to participate in interview, was  cooperative.  Says he came back because he was hearing voices again,  does not offer any other details.  Memory appears intact.  He is  oriented x3.   AXIS I:  1. 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  2. Cocaine abuse.  AXIS II:  No diagnosis.  AXIS III:  Hypertension.  AXIS IV:  Severe issues with unstable living situation and poor social  supports.  AXIS V:  Current 32, past year not known.   PLAN:  Admit him with a goal of stabilization.  We are going to restart  his Risperdal.  He is at 1 mg b.i.d. and 2 mg p.o. q.h.s. and his  clonidine for his blood pressure and he is on trazodone 50 mg h.s.  p.r.n. insomnia and we will coordinate with [facility] where he has previously been referred.      Margaret A. Lorin Picket, N.P.      Anselm Jungling, MD  Electronically Signed    MAS/MEDQ  D:  09/08/2008  T:  09/08/2008  Job:  [id number]

## 2010-11-13 NOTE — H&P (Signed)
[name], [name]           ACCOUNT NO.:  [id number]   MEDICAL RECORD NO.:  [id number]          PATIENT TYPE:  INP   LOCATION:  0102                         FACILITY:  [facility]   PHYSICIAN:  Darryl D. Prime, MD    DATE OF BIRTH:  July 07, 1960   DATE OF ADMISSION:  12/18/2008  DATE OF DISCHARGE:                              HISTORY & PHYSICAL   The patient is full code.  The patient has no primary care physician.   CHIEF COMPLAINT:  Snakes in his belly.   HISTORY OF PRESENT ILLNESS:  [name] is a 50 year old male with a  history of significant schizophrenia with uncontrolled psychotic  symptoms, history of homelessness, history of alcohol abuse.  He has a  history of depression, hypertension, history of possible diastolic heart  failure, who presents to the emergency room with the above complaints.  The patient also notes significant dysuria.  He has had problems  controlling his urine as well, and he has been incontinent for the last  few days. The patient notes recently he has had poor sleep and he has  been very anxious. He denies any illicit drug use at 1 time.  But at  another time he notes significant current alcohol and cocaine use.  The  patient in the emergency room had labs drawn. Labs showed a creatinine  elevated to 2.34 which is new. BUN was 30.  The patient notes he has a  contact in a cousin by the name of Barrie Lyme, telephone number [phone].   PAST MEDICAL AND SURGICAL HISTORY:  As above a history of diabetes as  well, all not well controlled   ALLERGIES:  No known drug allergies.   MEDICATIONS:  None.  He notes he should be on Risperdal.   SOCIAL HISTORY:  He smokes 1/2 pack a day of cigarettes since the age of  28.  Illicit drug use, history of alcohol use as above.   FAMILY HISTORY:  Kateri Mc was on dialysis before he passed away.   REVIEW OF SYSTEMS:  A 14-point review of systems was negative unless  stated above.   PHYSICAL EXAM:  VITAL  SIGNS:  Temperature is 99.1 with a blood pressure  of 127/64, pulse of 105, respiratory rate of 24, sat 97% on room air.  GENERAL:  The patient is a male, disheveled, very anxious-appearing, in  no acute distress.  HEENT: Normocephalic, atraumatic.  Pupils are equal, round and reactive  to light with extraocular movements being intact.  The oropharynx  reveals no posterior pharyngeal lesions.  Poor dentition.  LUNGS:  Clear to auscultation bilaterally.  CARDIOVASCULAR:  Regular rhythm and rate with no murmurs, rubs or  gallops. S1 and S2.  No S3 or S4.  ABDOMEN:  Scaphoid, soft, nontender, nondistended with no  hepatosplenomegaly.  EXTREMITIES:  No clubbing, cyanosis or edema.  NEUROLOGIC:  He is alert and oriented x4.  Cranial nerves II-XII are  grossly intact.  Strength and sensation are grossly intact.  PSYCHIATRIC:  Exam reveals significant agitated state. He denies any  suicidal or homicidal ideations but the patient has been aggressive,  particularly to females in the emergency room here and has been  threatening.   LABORATORY DATA:  Sodium is 144 with potassium 2.7, chloride 106, CO2 of  28, glucose 150, BUN 30, creatinine 2.3, calcium 10.1.  The patient's  alcohol level is less than 5.  CBC, white count 8.3 with hemoglobin of  13, hematocrit 39.4, platelets of 152, segs of 70.  Urinalysis showed  white blood cells 11-20 RBCs 0-2.  Urinalysis somewhat cloudy urine with  protein 100, trace ketones, small leukocytes. The patient's urine drug  screen is positive for cocaine.  The patient's chest x-ray showed  cardiomegaly with cephalization.  The cephalization is new compared to  chest x-ray October 13, 2008.  EKG is pending.   ASSESSMENT/PLAN:  This is a patient with a history of severe  schizophrenia with active psychotic behavior and now has acute renal  failure, prerenal azotemia, and a urinary tract infection.  Will hydrate  him to see if his kidneys improve and also get a  renal ultrasound to  rule out possible obstruction.  We will place a Foley.  We will treat  his urinary tract infection with ciprofloxacin IV, and he will be  hydrated. If his kidneys do not improve he may need a full battery  workup and possible renal consult. For his alcohol abuse will give  Ativan of needed.  This may also help with possible cocaine  intoxication. Multivitamin, thiamine, and folate will be ordered.  DVT  and GI prophylaxis will be ordered. For his active psychosis he will  need a Garment/textile technologist, and he will be in the step-down bed. For the  cardiomegaly with cephalization will repeat an echocardiogram.  His EF  was normal back in November 2009.  Will check a BNP. Recommend a  psychiatry consult when he is medically stable.      Darryl D. Prime, MD  Electronically Signed     DDP/MEDQ  D:  12/18/2008  T:  12/18/2008  Job:  [id number]

## 2010-11-13 NOTE — H&P (Signed)
[name], FREDELL NO.:  [id number]   MEDICAL RECORD NO.:  [id number]          PATIENT TYPE:  INP   LOCATION:  1827                         FACILITY:  [facility]   PHYSICIAN:  Vania Rea, M.D. DATE OF BIRTH:  March 15, 1961   DATE OF ADMISSION:  05/31/2008  DATE OF DISCHARGE:                              HISTORY & PHYSICAL   PRIMARY CARE PHYSICIAN:  Unassigned.   CHIEF COMPLAINT:  Abdominal and lower extremity swelling and progressive  shortness of breath.   HISTORY OF PRESENT ILLNESS:  This is a 50 year old African American  gentleman with a history of schizoaffective disorder and diabetes who  was recently discharged after treatments of community-acquired pneumonia  versus viral pneumonia, requiring intensive care unit management and at  that time was also found to be in diastolic heart failure and eventually  discharged home in stable condition on November 20.  Since discharge the  patient has been living in a shelter but he says he has been taking all  of his prescribed medications, but has noticed over the past few days  that he has been having progressive lower extremity swelling and  abdominal swelling and has been getting increasingly short of breath.  The patient came to the emergency room to be evaluated and the  hospitalist service was called to assist with management of possible  decompensated congestive heart failure.   Other than noted above the patient denies any other acute problems.  He  denies any fever, cough.  He denies any chest pain.  On further  questioning the patient admits that only his right leg is swollen.   PAST MEDICAL HISTORY:  1. Recent community-acquired pneumonia.  2. Diabetes, type 2.  3. Hypertension.  4. Schizoaffective disorder.  5. Diastolic heart failure.  6. History of subendocardial ischemia associated with his recent      pneumonia.   MEDICATIONS:  Per his discharge this patient takes:  1. Risperdal 2 mg at  bedtime.  2. Lithium carbonate 900 mg at bedtime.  3. Amlodipine 10 mg daily.  4. Lisinopril 40 mg daily.  5. Toprol XL 100 mg daily.  6. NovoLog 9 units subcutaneously three times daily with meals.  7. Cogentin 1 mg twice daily,  8. Albuterol inhaler two puffs every six hours p.r.n.  9. Atrovent inhaler two puffs every six hours p.r.n.  10.Nicotine patch one daily.  11.Protonix 40 mg every 12 hours.  The patient did not fill the      prescription for Senna or aspirin.   ALLERGIES:  No known drug allergies.   SOCIAL HISTORY:  Lives in a shelter, has a 60 pack-year smoking history.  Past remote history of crack cocaine use.   FAMILY HISTORY:  Significant for coronary artery disease and  hypertension in his father and mother with psychiatric problems.   REVIEW OF SYSTEMS:  On a 10-point review of systems the patient denies  any other problems.   PHYSICAL EXAMINATION:  GENERAL:  Pleasant, middle-aged, obese, African  American gentleman sitting up on the stretcher, does not appear acutely  distressed.  VITAL SIGNS:  Temperature is 98.2, his pulse  is 92, respirations 18,  blood pressure on initial presentation was 208/118.  With Lasix and  transdermal nitroglycerin his blood pressure is now at 164/85.  He is  saturating 96% on 2 L.  HEENT:  His pupils are round and equal.  Mucous membranes pink and  anicteric.  NECK:  He has no cervical lymphadenopathy or thyromegaly.  CHEST:  He has bibasilar crackles.  CARDIOVASCULAR:  Regular rhythm.  ABDOMEN:  Obese, soft, and nontender.  There are no masses.  EXTREMITIES:  He has 1+ pitting edema in the right lower extremity.  He  has no edema in the left.  CENTRAL NERVOUS SYSTEM:  Cranial nerves II-XII are grossly intact.  He  has no focal neurologic deficit.  MUSCULOSKELETAL:  The patient's muscle tone and bulk are normal.  SKIN:  Without blemish.   LABORATORY DATA:  His white count is 8.7, hemoglobin 11.9, platelets  215.  He has  normal differential.  He does have polychromasia target  cells and elliptocytes on his film.  His complete metabolic panel is  significant for glucose somewhat elevated to 165, BUN of 15, and  creatinine of 0.9.  His liver function tests are elevated above his  discharge values.  His AST is 113, his ALT is 110.  His total bilirubin  is normal at 0.4.  His alkaline phosphatase is normal at 115, total  protein 6.1, albumin 3.3, calcium 9.5.  His lipase is normal.  Beta type  natriuretic peptide is normal at 107.  \  Acute abdominal series demonstrates no acute abdominal abnormalities.  Cardiomegaly with diffuse interstitial pulmonary edema consistent with  CHF.  Small to moderate size of bilateral pleural effusion.  A 2-D echo  done on November 16 prior to discharge shows overall left ventricular  systolic function normal with EF of 60%.  Mild mitral valve regurge.  Left atrium mildly dilated.  Aortic valve is mildly increased with mild  aortic valve regurge.  Right ventricular systolic function was normal  and right ventricular size was normal.   ASSESSMENT/PLAN:  1. Right lower extremity swelling in a gentleman recently discharged      from the hospital presenting also with shortness of breath and a      normal beta type natriuretic peptide.  Will have to check this      gentleman's D-dimer and start him on full anticoagulation if      elevated as there is a possibility he has a DVT and possible      pulmonary embolism.  Will get lower extremity Dopplers in the      morning and CT angiogram is considered necessary.  2. Marginally elevated beta type natriuretic peptide with interstitial      edema on chest x-ray.  Will go ahead and continue Lasix started by      emergency room, but this is an unusual combination unless the edema      is very sudden.  Will therefore get serial cardiac enzymes.  3. Diabetes, type 2.  Fair control.  4. Elevated liver enzymes.  May possibly be a side effect  of his      medications or may be related to hepatic congestion.  Will follow      in  hospital.  5. Schizoaffective disorder, stable.  6. Hypertensive urgency.  7. The patient insists he has been taking his medications as      prescribed.  Will monitor him on the current medications, but will  also continue him on nitrates and Lasix.  8. Other plans as per orders.      Vania Rea, M.D.  Electronically Signed     LC/MEDQ  D:  05/31/2008  T:  06/01/2008  Job:  [id number]

## 2010-11-13 NOTE — Consult Note (Signed)
NAMEZACHARIAS, RIDLING NO.:  [id number]   MEDICAL RECORD NO.:  [id number]          PATIENT TYPE:  EMS   LOCATION:  ED                           FACILITY:  [facility]   PHYSICIAN:  Antonietta Breach, M.D.  DATE OF BIRTH:  [birth year]   DATE OF CONSULTATION:  09/19/2008  DATE OF DISCHARGE:                                 CONSULTATION   Mr. Misener has now become socially appropriate.  He is completely  oriented to all spheres.  His memory function is intact.  He is no  longer having delusions.  He has no hallucinations.  He has no thoughts  of harming himself or others.  He is motivated to use cocaine dependence  rehabilitation tools.   He has constructive interests and future goals.   REVIEW OF SYSTEMS:  NEUROLOGIC:  No stiffness or other extrapyramidal  side effects of the Zyprexa.  CARDIOVASCULAR:  He has had some mild  hypertension which is being treated by the ER physician with adjusting  clonidine.  Mr. Mesta has a followup appointment for his medication.   MENTAL STATUS EXAM:  Mr. Martelli is alert.  He is sitting up on his  hospital bed with no abnormal involuntary movements.  His eye contact is  good.  Concentration is normal.  He is oriented to all spheres.  Memory  function is intact to immediate recent and remote.  Speech involves  normal rate and prosody without dysarthria.  Thought process is logical,  coherent, goal-directed.  No looseness of associations.  Thought  content, no thoughts of harming himself or others.  No delusions or  hallucinations.  He is socially appropriate and cooperative.  Insight is  intact.  He does understand how the cocaine is precipitating recurrence  of his psychosis.  His judgment is within normal limits now.   ASSESSMENT:  AXIS I:  1. 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  2. Rule out schizophrenia, chronic undifferentiated type,  now stable.      Possible schizoaffective disorder.  3. Mr. Pedley has now intact judgment with the resolution of his      psychosis.  He is not at risk to harm himself or others.  4. He agrees to call emergency services immediately for any thoughts      of harming himself or others or hallucinations or distress.  5. The undersigned provided reeducation regarding prevention and the      necessity of avoiding cocaine.   RECOMMENDATIONS:  1. Mr. Lesniak has arranged followup today at the [facility].  2. He also has been given the phone number to [facility] for chemical      dependence rehabilitation.  3. 12-step groups.  4. Outpatient prescriptions Zyprexa 20 mg q.h.s., Cogentin 1 mg      b.i.d., trazodone 50 mg q.h.s. p.r.n.   The undersigned does recommend an inpatient dual diagnosis program.  However, the patient has declined and he is no longer committable.  He  does have intact outpatient care  plans as above.      Antonietta Breach, M.D.  Electronically Signed     JW/MEDQ  D:  09/19/2008  T:  09/19/2008  Job:  [id number]
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
## 2010-11-13 NOTE — Discharge Summary (Signed)
[name], [name] NO.:  [id number]   MEDICAL RECORD NO.:  [id number]          PATIENT TYPE:  IPS   LOCATION:  0300                          FACILITY:  [facility]   PHYSICIAN:  Jasmine Pang, M.D. DATE OF BIRTH:  August 02, 1960   DATE OF ADMISSION:  08/12/2008  DATE OF DISCHARGE:  08/14/2008                               DISCHARGE SUMMARY   IDENTIFICATION:  This is a 50 year old African American male who was  admitted on a voluntary basis on August 12, 2008.   HISTORY OF PRESENT ILLNESS:  The patient complained of suicidal  thoughts.  He presents after being evicted from his boarding health.  He  was kicked out of shelter last night.  He was getting agitated during  the assessment and complaining of people putting snakes under his bed.  He recently went to [facility] for 2 weeks.  He complains  of not sleeping in several days.   PAST PSYCHIATRIC HISTORY:  The patient sees Dr. Hortencia Pilar at [facility].  Last [facility] admission was December 2009.  He  has occasional cocaine use starting at age 69, last use 2 weeks ago;  alcohol since the age of 40.  He was recently at [facility].  He has a history of multiple psychiatric hospitalizations.   ALCOHOL AND DRUG HISTORY:  See above.   MEDICAL PROBLEMS:  Hypertension, diabetes.   MEDICATIONS:  Risperdal 2 mg p.o. q.h.s., Eskalith 900 mg p.o. q.h.s.,  clonidine 0.5 mg p.o. q.h.s.   DRUG ALLERGIES:  The patient states he is intolerant of HALDOL.   PHYSICAL FINDINGS:  There were no acute physical or medical problems  noted.  The patient's physical exam was done in the ED prior to transfer  to our unit.   HOSPITAL COURSE:  Upon admission, the patient was restarted on Risperdal  2 mg at bedtime on arrival to the unit for first dose.  He was also  started on a 21 mg nicotine patch per smoking cessation protocol.  He  was started on clonidine 0.1 mg p.o. q.h.s., Risperdal  2 mg p.o. q.h.s.,  and Cogentin 1 mg p.o. q.h.s.  In individual sessions, the patient was  somewhat reserved.  He discussed having legal charges for he came in.  He states he was kicked out of a shelter the night before he came in.  He states he is having suicidal ideation, but cannot say how he will  harm himself.  He was somewhat agitated during the assessment with  increased psychomotor activity, speech fast and pressured.  On August 13, 2008, the patient stated he was okay.  He was just relaxing.  He was  more alert and oriented.  He stated he thought social services would pay  for him to stay at a rest home until his next check comes on August 31, 2008.  He still had motor restlessness.  He was given one-time dose of  Risperdal 3 mg now and Ativan 2 mg now.  Then Risperdal dose was changed  to 1 mg at 8:00 a.m. and 3:00 p.m.,  and 2 mg at bedtime.  On August 14, 2008, the patient was asking to be discharged.  He stated his room  was at the boarding house, which was paid for until the 19th.  He denied  suicidal or homicidal ideation, auditory or visual hallucinations.  Mood  was somewhat anxious, but more stable than upon admission, and affect  was consistent with mood.  There was no paranoia or delusions.  Thoughts  were logical and goal-directed, thought content.  No predominant theme.  Cognitive was grossly intact.  Insight fair, judgment fair, impulse  control fair.  He was felt to be safe for discharge today and was given  a bus voucher for transportation.  He was also given samples of his  medication.   DISCHARGE DIAGNOSES:  Axis I:  Mood disorder, not otherwise specified;  features of psychotic disorder, NOS.  Axis II:  Features of personality disorder, NOS.  Axis III:  Hypertension.  Axis IV:  Severe (problems with primary support group, economic problem,  housing problem, problems related to his psychiatric disorder).  Axis V:  Global assessment of functioning was 65  upon discharge.  GAF  was 58 upon admission.  GAF highest past year was 68.   DISCHARGE PLANS:  There was no specific activity level or dietary  restrictions.   POSTHOSPITAL CARE PLANS:  The patient refused any followup plans.  It  was arranged that the social worker was going to call him about followup  appointments the following day.   DISCHARGE MEDICATIONS:  1. Risperdal 1 mg twice a day and 2 mg at bedtime.  2. Cogentin 1 mg twice a day.  3. Clonidine 0.1 mg at bedtime.   He is to follow up with [facility] for  his blood pressure check.      Jasmine Pang, M.D.  Electronically Signed     BHS/MEDQ  D:  09/01/2008  T:  09/02/2008  Job:  [id number]

## 2010-11-13 NOTE — Discharge Summary (Signed)
[name], [name]           ACCOUNT NO.:  [id number]   MEDICAL RECORD NO.:  [id number]          PATIENT TYPE:  INP   LOCATION:  5157                         FACILITY:  [facility]   PHYSICIAN:  Monte Fantasia, MD  DATE OF BIRTH:  1960/11/03   DATE OF ADMISSION:  05/16/2008  DATE OF DISCHARGE:  05/20/2008                               DISCHARGE SUMMARY   ADDENDUM   PLAN OF DISCHARGE:  The patient will be discharged today.  As per the  addendum to the medications on discharge, we will discontinue the Lasix,  also discontinue his Tamiflu and change the metoprolol XL to 100 mg p.o.  daily.  The patient is stable enough for discharge and need to follow up  with the primary care physician in next week.  Also, the patient advised  to follow up with [facility] as an outpatient.  The patient  needs to have stress Myoview as an outpatient as per Cardiology  recommendation during his stay in the hospital.   MEDICATIONS ON DISCHARGE:  1. Albuterol 2 puffs MDI q.6 h. p.r.n., breathlessness.  2. Amlodipine 10 mg p.o. daily.  3. Lisinopril 40 mg p.o. daily.  4. Metoprolol XL 100 mg p.o. daily.  5. Cefuroxime 500 mg p.o. b.i.d. for 5 days.  6. Moxifloxacin 500 mg p.o. b.i.d. for 5 days.  7. Guaifenesin 600 mg p.o. b.i.d.  8. NovoLog 9 units subcutaneous t.i.d.  9. Cogentin 1 mg p.o. b.i.d.  10.Atrovent 2 puffs MDI q.6 h. p.r.n. breathlessness.  11.Lithium carbonate 900 mg p.o. at bedtime.  12.Nicotine patch 21 mg daily.  13.Protonix 40 mg p.o. q.12  14.Risperdal 2 mg p.o. at bedtime.  15.Senna 1 tablet p.o. at bedtime.   PLAN:  Plan to discharge the patient today.  We will recheck the blood  pressure and the patient is fit to be discharged.      Monte Fantasia, MD  Electronically Signed     MP/MEDQ  D:  05/20/2008  T:  05/21/2008  Job:  [id number]

## 2010-11-13 NOTE — Consult Note (Signed)
[name], [name] NO.:  [id number]   MEDICAL RECORD NO.:  [id number]          PATIENT TYPE:  INP   LOCATION:  2110                         FACILITY:  [facility]   PHYSICIAN:  Verne Carrow, MDDATE OF BIRTH:  [birth year]   DATE OF CONSULTATION:  05/17/2008  DATE OF DISCHARGE:                                 CONSULTATION   PRIMARY CARE PHYSICIAN:  None.   CARDIOLOGIST:  None.   Consulting Cardiologist:  Dr. Earney Hamburg   CHIEF COMPLAINT:  Shortness of breath with hemoptysis.   HISTORY OF PRESENT ILLNESS:  The patient is a 50 year old male with past  medical history of hemoptysis in 2006, recent incarceration,  schizophrenia, diabetes mellitus, remote cocaine abuse, hypertension,  and tobacco abuse who presented to the emergency room 2 days ago  complaining of significant hemoptysis, shortness of breath, and  coughing.  He denied any cocaine use in the last 3 years and notes many  sick contacts both in prison and at the homeless shelter where he is  residing.  He denies recent chest pain but does note in prison he had 2-  3 episodes of chest pain that resolved with rubbing one side of his  chest after a few minutes.  He is also complaining of fevers, chills,  and orthopnea.  He notes that his blood pressure has been high for many  years, and he has not taken any of his medications in the last few weeks  because of current life circumstances that he is in right now.   PAST MEDICAL HISTORY:  1. History of hemoptysis in 2006 with unknown etiology.  2. Diabetes mellitus type 2.  3. Poorly controlled hypertension.  4. Schizophrenia.  5. Remote history of cocaine abuse.  6. Tobacco abuse.  7. Obstructive sleep apnea.   PAST SURGICAL HISTORY:  Plantar wart removal.   HOME MEDICATIONS:  1. Glucotrol, unsure dose.  2. Clonidine, unsure dose, but note the patient has not been taking      any of his medicines in the last few weeks.   ALLERGIES:  No  known drug allergies.   SOCIAL HISTORY:  The patient is a 50 year old single gentleman who lives  in [place] and is currently residing in a homeless shelter.  He was  incarcerated for 3 years and was released approximately a month prior to  admission.  He is currently unemployed.  He has a 60-pack-year smoking  history and denies any alcohol or illegal drugs.  He does note that he  used cocaine off and on for 20 years but quit approximately 3 years ago  before he went to prison and has not used since.   FAMILY HISTORY:  Mother had psychiatric problem, father had MI in his  late 100s and hypertension.  His siblings have no known medical problems  and he does not have any children.   REVIEW OF SYSTEMS:  Notable for fever, chills, shortness of breath,  orthopnea, hemoptysis, cough, and bright red blood per rectum.  Other  systems reviewed and negative.   PHYSICAL EXAMINATION:  VITAL SIGNS:  Temperature max over the last 24  hours 100.4, temperature current 98.3, blood pressure 155/77, heart rate  113, respiratory rate 23, O2 sat 95% on 5 L nasal cannula.  GENERAL:  He is alert, oriented x3, mildly tachypneic and in no acute  distress.  HEENT:  His pupils equally round and reactive to light.  Extraocular  motion intact.  Sclera clear with positive arcus senilis bilaterally.  Poor dentition and oropharynx without erythema or exudate.  NECK:  Supple without thyromegaly, carotid bruit, and JVD.  CARDIOVASCULAR:  Mildly tachycardic with regular rhythm and normal S1,  S2 and S4 auscultated without murmurs and rubs.  Radial, dorsalis pedis,  and carotid pulses are normal and equal bilaterally.  ABDOMEN:  Good bowel sounds, soft, nontender, and nondistended.  EXTREMITIES:  No clubbing, cyanosis, or edema.  NEURO:  The patient is alert, oriented x3 and cranial nerves II through  XII grossly intact.  Strength is 5/5 in all extremities.   DIAGNOSTIC IMAGING:  Chest x-ray today shows diffuse,  stable airspace  opacities and stable slight cardiomegaly.   CT angio on May 16, 2008, showed no pulmonary emboli, bilateral  perihilar infiltrates with mediastinal adenopathy, and small bilateral  effusions.   EKG is pending at this time.   LABORATORY DATA:  White blood cell count 10.8, hemoglobin 12.3, platelet  count 215.  Sodium 141, potassium 2.7, chloride 104, bicarb 28, BUN 9,  creatinine 0.88, glucose 158, total bilirubin 1.3, alkaline phosphatase  81, AST 21, ALT 24, total protein 6.2, albumin 3.0, TSH 0.446, D-dimer  0.83, hemoglobin A1c 5.6, BNP 147 on admission, then 216, then 133.  Point of care markers, CK 106, CK-MB 2.1, and troponin 0.18, and  troponin today 0.11, PTT 34, PT 14.9, INR 1.1, ANA negative, ESR 20, CRP  12.3.  Urine strep negative.  Urine drug screen negative.  Calcium 8.8,  magnesium 2.2, phosphorus 3.3.  HIV nonreactive.   ASSESSMENT AND PLAN:  The patient is a 50 year old male with  1. Pulmonary hemorrhage, unsure of etiology but do not feel like his      diastolic dysfunction is contributing to his hemoptysis.      Differential to include pneumonia, influenza, tuberculosis, etc.      Agree with checking ANA and ANCA.  Mediastinal lymphadenopathy on      chest CT points more towards infectious etiology as opposed to      cardiac etiology of his shortness of breath.  Agree with the      treatment plan per pulmonology at this point.  2. Diastolic dysfunction.  May account from some symptoms of shortness      of breath but feel like the number one culprit of his current      shortness of breath in his pulmonary pathology.  Continue with      diuresis.  The patient is negative 4 L since admission.  We will      check EKG.  We will aim for aggressive blood pressure control and      titrate up ACE inhibitor at this time.  Mildly increased troponin      likely secondary to subendocardial injury from his diastolic      dysfunction.  He does have some  remote history of chest pain in the      past and he is a diabetic and so he may present with atypical signs      and symptoms of cardiac ischemia; however, at this point feel like      his  shortness of breath is not related to any kind of ongoing ischemia      at this point.  We will continue with aggressive blood pressure      management and may consider a Myoview in the outpatient setting      giving his multiple risk factors once his pulmonary status is more      stable.      Joaquin Courts, MD  Electronically Signed      Verne Carrow, MD  Electronically Signed    VW/MEDQ  D:  05/17/2008  T:  05/18/2008  Job:  [id number]
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
## 2010-11-13 NOTE — H&P (Signed)
NAMEOSHEN, WLODARCZYK           ACCOUNT NO.:  [id number]   MEDICAL RECORD NO.:  [id number]          PATIENT TYPE:  INP   LOCATION:  1514                         FACILITY:  [facility]   PHYSICIAN:  Darryl D. Prime, MD    DATE OF BIRTH:  [birth year]   DATE OF ADMISSION:  12/18/2008  DATE OF DISCHARGE:                              HISTORY & PHYSICAL   ADDENDUM:  Patient had an EKG performed.  It showed at 2151 normal sinus  rhythm with event rate of 80 beats per minute.  He has LVH with  associated prolonged QT and T-wave inversions laterally, PR interval  130, QRS 88, QT corrected 463.  No major change compared to EKG on Nov 27, 2008.      Darryl D. Prime, MD  Electronically Signed     DDP/MEDQ  D:  12/18/2008  T:  12/19/2008  Job:  [id number]

## 2010-11-13 NOTE — H&P (Signed)
[name], [name] NO.:  [id number]   MEDICAL RECORD NO.:  [id number]          PATIENT TYPE:  IPS   LOCATION:  0402                          FACILITY:  [name]   PHYSICIAN:  Anselm Jungling, MD  DATE OF BIRTH:  17-Mar-1961   DATE OF ADMISSION:  08/17/2008  DATE OF DISCHARGE:                       PSYCHIATRIC ADMISSION ASSESSMENT   IDENTIFYING INFORMATION:  This is a 50 year old male, this is a  voluntary admission.   HISTORY OF PRESENT ILLNESS:  This is the third [name] admission for this 50-  year-old who presented in the emergency room with pressured speech,  flight of ideas, also hyperverbal speech, quite disorganized at times  with sexual references.  His behavior was a little bit disorganized, but  he did not act out and was not aggressive.  Today, he reports that he  believes that there is a serpent in his stomach and that is why he came  back knowing that he is not quite right, says that he was taking his  medicines at home, but does not have a safe place to live.   PAST PSYCHIATRIC HISTORY:  This is his third [name] admission in 60 days.  Initially, he was here June 27, 2008, for 1 day, took some  medication, felt better and wanted to leave.  Recognized that he needed  to take medication and was denying any dangerous thoughts.  His second  [name] admission on August 12, 2008 until August 14, 2008, also  admitted with pressured speech and flight of ideas.  He reports that he  had been at [name] for 2 weeks in January, and they  had started him on lithium, which he states he does not take anymore and  also on the Risperdal.  He said that he had run out of medication, but  knows that he needs to take it.  He denied any substance abuse.  He  denies any prior suicide attempts.   SOCIAL HISTORY:  A single [place] American male who reported that he had  been living in a rooming house here in [name], had no family, no  support.  Denies  having children.  Not currently married.  Source of  income is unclear.   FAMILY HISTORY:  Not available.   MEDICAL HISTORY:  No regular primary care [name].  He says he has been  seen at [name] in the past for high blood pressure.  Medical problems are hypertension.   MEDICATIONS:  1. Clonidine 0.1 mg p.o. nightly.  2. Risperdal 1 mg b.i.d., 2 mg p.o. nightly.   DRUG ALLERGIES:  1. NONE.  2. He is intolerant of Haldol, which has caused him to have EPS      symptoms in the past.   Physical exam was done in the emergency room and is noted in the record.  A medium build [place] American male today in no physical distress.  Notably his AMES score is 0.  No signs of EPS.  On physical exam, we note that he is 5 feet 9 inches tall and 91 kg.   Diagnostic studies were done in the emergency room.  Salicylate level  less than 4.  Acetaminophen level less than 10.  Basic metabolic panel  within normal limits.  Alcohol level less than 5.  Urine drug screen was  positive for marijuana.  CBC within normal limits.   MENTAL STATUS EXAM:  Revealed a fully alert male, well-nourished, well-  developed, pleasant, polite, oriented x4.  No overt symptoms of  psychosis at the time he was first examined.  No evidence of thought  disorder, asking for some help with his mood and wanting help with his  living situation.   INITIAL IMPRESSION:  Axis  I:  Schizoaffective disorder.  Axis II:  No diagnosis.  Axis III:  Hypertension.  Axis IV:  Issues with unstable home environment.  Axis V:  Global Assessment of Functioning 42, past year not known.   Plan is to voluntarily admit him.  He is on our stabilization group or  intensive care program.  He has expressed that he cannot make it any  longer out in the community and feels that he would do better in a group  home and is asking for help with this.  We have restarted his routine  medications.  Our social worker will be working with him  on  alternatives.      Margaret A. Lorin Picket, N.P.      Anselm Jungling, MD  Electronically Signed    MAS/MEDQ  D:  08/19/2008  T:  08/19/2008  Job:  [id number]
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
## 2010-11-13 NOTE — Discharge Summary (Signed)
[name], [name]           ACCOUNT NO.:  [id number]   MEDICAL RECORD NO.:  [id number]          PATIENT TYPE:  IPS   LOCATION:  0403                          [facility]   PHYSICIAN:  Anselm Jungling, MD  DATE OF BIRTH:  30-Apr-1961   DATE OF ADMISSION:  06/27/2008  DATE OF DISCHARGE:  06/27/2008                               DISCHARGE SUMMARY   IDENTIFYING INFORMATION:  The patient is a 50 year old African American  male.  This was a voluntary admission.   HISTORY OF PRESENT ILLNESS:  This was a brief inpatient stay for this 60-  year-old who admitted in the emergency room to having some auditory  hallucinations, was having some visual hallucinations in the emergency  room.  He had difficulty sitting on the stretcher, believing that snakes  were crawling on him, and things were moving around, appeared to be  having some visual hallucinations, and he was responding to internal  stimuli.  He admitted that he had been off his medicines for a couple of  weeks and had not slept in several days, had also not been taking his  medications for his diabetes or for his high blood pressure.  He  admitted that he was having some suicidal thoughts but did not have a  specific plan.  He had raised his voice to a nurse in the emergency room  telling her to get out of the room.  There was some concern if he had  thoughts of hurting others.  Today, however, he says that he admits he  should be taking his medication, has medications at home for his  diabetes, his schizophrenia and his high blood pressure but has not been  taking them regularly.  He says that they work well when he does take  them.  Denies any suicidal or homicidal thoughts and has been requesting  to go home.   PAST PSYCHIATRIC HISTORY:  Significant for previous admissions for  auditory hallucinations, and he has a history of cocaine abuse.  Currently followed as an outpatient at [facility].   MEDICAL  HISTORY:  Medical problems are hypertension and diabetes.   PHYSICAL EXAMINATION:  Done in the emergency room.   Urine drug screen was negative for all substances, and diagnostic  studies were otherwise generally unremarkable.  BUN 11, creatinine 1.  On admission, he was unable to give a full accounting of his medications  as he takes between 2 or 3 mg of Risperdal at night.  Says he has  medications at home to control his blood pressure.   DRUG ALLERGIES:  None.  He is intolerant of HALDOL.   MENTAL STATUS EXAMINATION:  Today, revealed a polite, fully oriented  gentleman, fully alert, feels that his admission here is a  misunderstanding.  He says that he had been compliant with his  outpatient Risperdal at [facility], denies any recent  illicit drug use.  He is friendly, animated, oriented x4, pleasant and  appropriate, wants very much to be released and gives reassurance that  he will take his medications and follow up.  He says  that he does not  want medical followup appointments for his hypertension, has medications  at home and will resume them on his own   Axis I  Schizoaffective disorder, not otherwise specified.  Axis II  No diagnosis.  Axis III  Hypertension and diabetes.  Axis IV  Deferred.  Axis V  Current 59, past year 66 estimated.   PLAN:  Discharge him today.  We have scheduled followup for him with  [facility].  We have written a prescription for  Risperdal 2 mg p.o. at bedtime.  He will resume his other medications on  his own.  Will prescribe Risperdal 2 mg p.o. at bedtime, prescribed 30  no refills.      Margaret A. Lorin Picket, N.P.      Anselm Jungling, MD  Electronically Signed    MAS/MEDQ  D:  06/27/2008  T:  06/27/2008  Job:  [id number]
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
## 2010-11-13 NOTE — Consult Note (Signed)
NAMEJACQUEES, [name]           ACCOUNT NO.:  [id number]   MEDICAL RECORD NO.:  [id number]          PATIENT TYPE:  INP   LOCATION:  1514                         FACILITY:  [name]   PHYSICIAN:  Antonietta Breach, M.D.  DATE OF BIRTH:  Mar 02, 1961   DATE OF CONSULTATION:  12/20/2008  DATE OF DISCHARGE:                                 CONSULTATION   REASON FOR CONSULTATION:  Psychosis.   HISTORY OF PRESENT ILLNESS:  [name] is a 50 year old  male admitted to the [name] on December 18, 2008 due to  psychosis, abdominal pain, and dehydration.   [name] was expressing delusions that he had a snake in his  intestine.  He was severely agitated.  The psychosis started on  approximately June 18.  He did have a positive urine drug screen for  cocaine.   He has been started on Zyprexa and today he is socially appropriate and  redirectable.  He is oriented to all spheres.  His memory function is  intact.  He does agree that cocaine triggers his exacerbations and he  expresses motivation to abstain from cocaine.   Prior to admission, his maintenance psychotropic medication was supposed  to the Zyprexa 20 mg q.h.s. with trazodone q.h.s. along with Cogentin 1  mg b.i.d.  His trazodone dosage has been 50 mg q.h.s. as listed on  review of the past medical record.   It is unclear to what degree he has been compliant with his psychotropic  medication.  He clearly relapsed on cocaine.   Today he also has constructive future goals and interests.  He no longer  has any thoughts of harming himself or others.  He has no delusions or  hallucinations.   PAST PSYCHIATRIC HISTORY:  In review of the past medical record, Mr.  [name] does have a history of multiple psychotic episodes which have  involved delusions and severe agitation.   He has been treated with Risperdal in the past.  However, with his EKG  changes including a QTC between 450 and 500 milliseconds, he has  been  switched to Zyprexa.   His last admission to the [name] is listed in the  past medical record.  He was admitted on September 08, 2008 and discharged  on March 15.  He had psychosis with somatic delusions at that time.   FAMILY PSYCHIATRIC HISTORY:  None known.   SOCIAL HISTORY:  [name] is single.  He has a room at [name].  He has no children.  He does have relatives in the city.  He does have a history of multiple relapses on cocaine.  He is medically  disabled and unemployed due to his schizophrenia.   PAST MEDICAL HISTORY:  Admitted with dehydration.  His QTC was 463 on  his EKG.  He does have a history of diabetes.   MEDICATIONS:  His MAR is reviewed.   ALLERGIES:  He has an allergy to HALOPERIDOL.   He is not currently on any psychotropic medications other than Zyprexa 5-  10 mg q.6 h p.r.n.   He did receive  Zyprexa last night.   LABORATORY DATA:  Urine drug screen positive for cocaine.  Sodium 142,  BUN 10, creatinine 0.99, glucose 112, WBC 5.3, hemoglobin 11.3, platelet  count 111,000.   REVIEW OF SYSTEMS:  Constitutional, head, eyes, ears, nose, throat,  mouth, neurologic, psychiatric, cardiovascular, respiratory,  gastrointestinal, genitourinary, skin, musculoskeletal, hematologic,  lymphatic, endocrine, and metabolic are all unremarkable.   EXAMINATION:  VITAL SIGNS:  Temperature 97.6, pulse 87, respiratory rate  18, blood pressure 159/91, O2 saturation on room air 97%.  GENERAL APPEARANCE:  [name] is a middle-aged male sitting in his  [name] bed with no abnormal involuntary movements.   MENTAL STATUS EXAM:  [name] is alert.  His eye contact is intact.  His attention span is normal.  His affect is slightly anxious at  baseline but with a normal range.  His mood is within normal limits.  His concentration is within normal limits.  He is oriented completely to  all spheres.  His memory is intact to immediate  recent and remote.  His  fund of knowledge and intelligence are within normal limits.  His speech  involves normal rate and prosody without dysarthria.  Thought process is  logical, coherent, goal-directed.  No looseness of associations.  Thought content, no thoughts of harming himself or others.  No delusions  or hallucinations.  His insight is intact.  As mentioned above, he  understands the cocaine can trigger a read exacerbation of his psychotic  condition and he is motivated for abstinence.  His judgment is now  within normal limits.   ASSESSMENT:  AXIS I:  1. 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  He does have a history of schizophrenia.  He also      has experienced an exacerbation of his psychosis correlated with      cocaine relapse.  2. Cocaine dependence.  AXIS II:  Deferred.  AXIS III:  See past medical history.  AXIS IV:  Primary support group.  AXIS V:  55.   PLAN:  [name] is not at risk to harm himself or others.  He agrees  to call emergency services immediately for any thoughts of harming  himself, thoughts of harming others or distress or hallucinations.   The undersigned provided ego supportive psychotherapy and education.   The indications, alternatives and adverse effects of the following were  reviewed with the patient:  Trazodone, Cogentin, Zyprexa.  He  understands and wants to proceed as below.   RECOMMENDATIONS:  1. Would continue Zyprexa 20 mg p.o. daily to help with any acute      anxiety.  The Zyprexa can be divided up into 5 mg b.i.d. in 10 mg      q.h.s. in order to benefit from the mild tranquilizing side      effects.  2. While on Zyprexa would monitor with abnormal involuntary movement      scales and hemoglobin A1c checks periodically.  3. For anti-insomnia and antianxiety would continue trazodone 50-100      mg q.h.s.  4. For anti-extrapyramidal side effects of the Zyprexa would utilize       Cogentin 1 mg b.i.d.  5. Would ask the social worker to set [name] with a psychiatric      outpatient followup within 1 week of discharge.  6. [name] is psychiatrically cleared for discharge.  7. Would recommend that Mr. Hollingsworth attend a chemical dependence  residential rehabilitation program for cocaine.  The social worker      can offer this to the patient.  However, if he declines he is not      committable.  8. If he declines a residential chemical dependence unit, would      recommend that he followup with [facility] as      an outpatient and continue 12-step groups.      Antonietta Breach, M.D.  Electronically Signed     JW/MEDQ  D:  12/20/2008  T:  12/20/2008  Job:  [id number]
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
## 2010-11-13 NOTE — H&P (Signed)
[name], ANTONY           ACCOUNT NO.:  [id number]   MEDICAL RECORD NO.:  [id number]          PATIENT TYPE:  INP   LOCATION:  2110                         FACILITY:  [facility]   PHYSICIAN:  Della Goo, M.D. DATE OF BIRTH:  03-Oct-1960   DATE OF ADMISSION:  05/16/2008  DATE OF DISCHARGE:                              HISTORY & PHYSICAL   PRIMARY CARE PHYSICIAN:  Unassigned.   CHIEF COMPLAINT:  Worsening shortness of breath.   HISTORY OF THE PRESENT ILLNESS:  This is a 50 year old male who returns  to the emergency department with worsening shortness of breath, cough  and fever for the past 48 hours.  The patient had been seen in the  emergency department at [facility] on November 15,2009 and was  advised to be admitted; however, refused admission at that time, and  returned in the evening secondary to worsening symptoms.  The patient  reports being unable to sleep secondary to his severe dyspnea.  The  patient reports having hemoptysis for several episodes.  He reports also  having fevers and chills.  He reports having some weight loss, and has  had abdominal distention and discomfort.  The patient reports being  recently released from prison after a 3-year stint.   The patient, while in the emergency department, was found initially to  have an O2 saturation of 84% on room air, so supplemental oxygen was  placed.  An ABG on 100% nonrebreather mask revealed ABG results of pH  7.44, pCO2 42.1, pO2 61.7 and O2 sat 94.1%.  The patient was also  visibly tachypneic.  The patient received nebulizer treatments of  albuterol and Atrovent with mild improvement.  He was referred for  admission.   PAST MEDICAL HISTORY:  The past medical history is significant for:  1. Type 2 diabetes mellitus.  2. Hypertension.  3. Questionable history of schizophrenia.   MEDICATIONS:  The patient's medications include Glucotrol and clonidine.  The patient is unable to give dosages at  this time.  His medications  will be further verified.   ALLERGIES:  No known drug allergies.   SOCIAL HISTORY:  The patient lives in a group home currently.  He  reports being a smoker and smoking one pack daily for many years.  He  also reports smoking marijuana and reports previously using cocaine.   FAMILY HISTORY:  The family history is unable to be obtained at this  time.   REVIEW OF SYSTEMS:  The patient denies having any nausea, vomiting or  diarrhea.  He denies having any night sweats.   PHYSICAL EXAMINATION:  GENERAL APPEARANCE:  This is a 50 year old  overweight male who is in acute distress.  VITAL SIGNS:  The patient's vital signs are temperature 98.7, blood  pressure 152/103, heart rate 98-106, respirations 22-24, and O2 sat 91%  on 100% nonrebreather facial mask oxygen.  HEENT EXAMINATION:  Normocephalic and atraumatic head.  Pupils are  reactive to light bilaterally.  Extraocular movements are intact.  Funduscopic is benign.  There is no scleral icterus.  Oropharynx is  clear.  NECK:  The neck is supple with  full range of motion.  No thyromegaly,  adenopathy or jugular venous distention.  HEART:  Cardiovascular - regular rate and rhythm.  No murmurs, gallops  or rubs.  LUNGS:  The lungs have diffuse rhonchus breath sounds and decreased  bilaterally.  No expiratory wheezes.  ABDOMEN:  The abdomen has positive bowel sounds, is soft, nontender and  nondistended.  EXTREMITIES:  The extremities are without cyanosis, clubbing or edema.  NEUROLOGIC:  The neurological examination is nonfocal.   LABORATORY STUDIES:  White blood cell count 9.9, hemoglobin 11.1,  hematocrit 34.9, platelets 152,000 with neutrophils 79%, lymphocytes  14%, and MCV 92.1.  Sodium 140, potassium 3.3, chloride 109, carbon  dioxide 27, BUN 14, creatinine 0.81, and glucose 186. Cardiac enzymes  with a CK total of 106, CK/MB 2.1, relative index 2.0 and troponin 0.18.  Beta-natriuretic peptide  156.0.  Chest x-ray reveals worsening  multifocal predominately perihilar airspace opacities, mild cardiomegaly  and small bilateral pleural effusions.   ASSESSMENT:  The patient is a 50 year old male being admitted in:  1. Respiratory distress.  2. Worsening pneumonia.  3. The elevated blood pressure/hypertension.  4. Hyperglycemia.  5. Mild anemia.  6. Mild hypokalemia.   PLAN:  1. The patient will be admitted to an ICU area for monitoring and      continued care.  2. IV antibiotic therapy with Rocephin and azithromycin have been      started.  3. The patient has also been placed on nebulizer treatments, which we      will continue with albuterol and Atrovent therapy.  4. The patient will be placed on sliding scale insulin coverage as      needed.  5. The patient's medications will be further verified.  6. The patient will also be placed on a nicotine patch to prevent      nicotine withdrawal.  7. The patient has been advised that he may need to be intubated      secondary to his condition and his inadequate oxygenation.  The      patient initially agreed that he could be intubated if needed.  At      the point of the second arterial blood gases his was again      discussed with the patient and he adamantly refused to be      intubated.  The patient was also beginning to become agitated and      confused, and pulled off his 100% nonrebreather oxygen.  He was      strongly advised to remain with his oxygen 100% on to prevent      possible intubation further.  8. The patient was transferred to [facility] secondary to a      lack of bed availability at [facility].  9. The patient will also be placed on DVT and GI prophylaxis, and a D-      dimer will also be ordered.  10.This case was discussed with the pulmonary critical care physician      on call, Dr. Coralyn Helling, who is aware of this patient's condition      and possible need for further intervention, and  possible      intubation.  11.Dr. Lonia Blood was also made aware of the patient's condition as      well.  He is the [employer] physician on call at [facility].     Della Goo, M.D.  Electronically Signed    HJ/MEDQ  D:  05/16/2008  T:  05/17/2008  Job:  [id number]

## 2010-11-13 NOTE — Discharge Summary (Signed)
[name], [name]           ACCOUNT NO.:  [id number]   MEDICAL RECORD NO.:  [id number]          PATIENT TYPE:  INP   LOCATION:  1514                         FACILITY:  [facility]   PHYSICIAN:  Isidor Holts, M.D.  DATE OF BIRTH:  05-22-1961   DATE OF ADMISSION:  12/18/2008  DATE OF DISCHARGE:  12/20/2008                               DISCHARGE SUMMARY   ADDENDUM:   PMD:  Unassigned.   For discharge diagnoses, refer to interim summary, dictated December 19, 2008, refer also to the same interim summary for details of admission  history, clinical course, procedures and consultations.   On December 20, 2008, the following are pertinent:  The patient's mood  quickly stabilized and he appeared calm, following resolution of his  acute problems.  He was seen by Antonietta Breach, M.D., psychiatrist on  that date.  For details of that consultation, refer to consultation  notes of December 20, 2008.  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  He feels that the patient is not  at risk to harm himself or any others and has stated an agreement to  call emergency services immediately for any thoughts of harming himself,  thoughts of harming others or distress or hallucinations.  He has  recommended appropriate modification of the patient's psychotropic  medications, cleared the patient from the psychiatric viewpoint for  discharge, and will have him follow-up in the outpatient environment.   DISPOSITION:  The patient was discharged accordingly, on December 20, 2008.   DISCHARGE MEDICATIONS:  These have been updated as follows:  1. Ciprofloxacin 500 mg p.o. b.i.d. for 5 days from December 21, 2008.  2. Clonidine 0.1 mg p.o. b.i.d. (was on 0.1 mg daily).  3. Zyprexa 20 mg p.o. nightly.  4. Cogentin 1 mg p.o. b.i.d. (was on 1 mg p.o. daily).  5. Trazodone 50 mg p.o. nightly.   DIET:   Heart-healthy.   ACTIVITY:  As tolerated.   FOLLOW-UP INSTRUCTIONS:  The patient will follow up with outpatient  psychiatry, in the coming week.      Isidor Holts, M.D.  Electronically Signed     CO/MEDQ  D:  12/20/2008  T:  12/20/2008  Job:  [id number]

## 2010-11-13 NOTE — Discharge Summary (Signed)
[name], [name]           ACCOUNT NO.:  [id number]   MEDICAL RECORD NO.:  [id number]          PATIENT TYPE:  INP   LOCATION:  1514                         [facility]ACILITY:  [facility]   PHYSICIAN:  Isidor Holts, M.D.  DATE OF BIRTH:  [birth year]   DATE OF ADMISSION:  12/18/2008  DATE OF DISCHARGE:  12/19/2008                               DISCHARGE SUMMARY   PRIMARY MEDICAL DOCTOR:  Gentry Fitz.   DISCHARGE DIAGNOSES:  1. Acute psychosis.  2. History of schizophrenia.  3. Hypertension.  4. History of diastolic dysfunction.  5. Homelessness.  6. Mild urinary tract infection.  7. Dehydration/acute renal failure.  8. History of substance abuse.  9. Smoker.   DISCHARGE MEDICATIONS:  1. Ciprofloxacin 500 mg p.o. b.i.d. for 7 days.  2. Clonidine 0.1 mg p.o. b.i.d.   PROCEDURES.:  Abdominal/pelvic CT scan done December 18, 2008:  This showed  no acute intra-abdominal or intrapelvic abnormality.   CONSULTATIONS:  Psychiatrist.   ADMISSION HISTORY:  As in H and P notes of December 18, 2008, dictated by  Dr. Rachael Fee Prime. However, in brief, this is a 50 year old male, with  known history of schizophrenia, depression, homelessness, alcohol abuse,  hypertension, query diastolic dysfunction, query history of diabetes  mellitus, presenting with complaints of snakes in my belly, i.e.,  abdominal discomfort, as well as dysuria.  On initial evaluation in the  emergency department, he was found to have a pyuria and BUN was  significantly elevated at 30 with a creatinine of 2.3.  He was admitted  for further evaluation, investigation and management.   CLINICAL COURSE:  1. Acute psychosis.  For details of presentation, refer to admission      history above.  The patient has exhibited agitation, pressured      speech, and restlessness during the course of this hospitalization.      He is not currently on any apparent psychotropic medications and      has made attempts to leave hospital AMA.  It is  felt that      psychiatrist input will be required and transfer to the [facility], may be indicated.  Psychiatric consultation has been      called accordingly, and was still pending at the time of this      dictation.   1. Urinary tract infection.  The patient has a positive urine sediment      with WBCs 11 - 20, RBCs zero to 2.  He did complain of dysuria at      the time of presentation, and this appears consistent with urinary      tract infection.  He was initially started on intravenous      Ciprofloxacin and as of December 19, 2008 was on day #2 of therapy,      however, he has had no pyrexial episodes during the course of this      hospitalization and as of December 19, 2008 white cell count was normal      at 4.8.  The patient no longer has any symptoms referable to  urinary tract and abdominal imaging studies showed no evidence of      genitourinary tract abnormality.  He has been transitioned to oral      Ciprofloxacin for further 7-day course of treatment.   1. Dehydration/acute renal failure.  The patient's creatinine was 1.2      in May 2010, and he now presents with a BUN of 30, creatinine 2.3      consistent with dehydration, volume depletion and acute renal      failure.  He was managed with intravenous fluid hydration, with      satisfactory clinical response.  In the a.m. of December 19, 2008 BUN      was normalized at 19 with a creatinine of 1.25.  Intravenous fluids      have been discontinued and oral fluid intake encouraged.   1. Hypertension.  This was mildly elevated at 152/77 on December 19, 2008.      The patient has been commenced on Clonidine in a dose of 0.1 mg      p.o. b.i.d.   1. Smoking history.  This was managed with Nicoderm CQ patch.   DISPOSITION:  The patient is currently awaiting psychiatric input and  disposition will be per psychiatrist.      Isidor Holts, M.D.  Electronically Signed     CO/MEDQ  D:  12/19/2008  T:   12/19/2008  Job:  [id number]

## 2010-11-13 NOTE — Discharge Summary (Signed)
[name], [name]           ACCOUNT NO.:  [id number]   MEDICAL RECORD NO.:  [id number]          PATIENT TYPE:  IPS   LOCATION:  0402                          FACILITY:  [facility]   PHYSICIAN:  Anselm Jungling, MD  DATE OF BIRTH:  Nov 11, 1960   DATE OF ADMISSION:  09/08/2008  DATE OF DISCHARGE:  09/12/2008                               DISCHARGE SUMMARY   IDENTIFYING DATA/REASON FOR ADMISSION:  This was one of many inpatient  psychiatric admissions here at [facility] for [name], a 50 year old single  African American male with a long history of psychosis and substance  abuse.  He was admitted on this occasion because of a presentation of  psychosis with somatic delusions.  Please refer to the admission note  for further details pertaining to the symptoms, circumstances and  history that led to his hospitalization.  He was given initial Axis I  diagnoses of schizophrenia, chronic paranoid type and polysubstance  abuse.   MEDICAL/LABORATORY:  The patient was medically and physically assessed  by the psychiatric nurse practitioner.  He was in generally good health  without any serious active or chronic medical problems, except for  hypertension.  He was continued on clonidine 0.1 mg q.h.s.  There were  no significant medical issues.   HOSPITAL COURSE:  The patient was admitted to the adult inpatient  psychiatric service.  He presented as a well-nourished, normally-  developed adult male who was generally pleasant, friendly and  engageable, although not necessarily reliable in the accuracy or  truthfulness of any of his statements.  He stated that he believed that  there was a snake in his belly and he wanted help getting it out.   He was treated with a regimen of Risperdal, Cogentin, and trazodone to  help with sleep.  The patient was generally cooperative and pleasant  during this inpatient stay.  He appeared to tolerate medication well.   On the fifth hospital day, he requested  discharge and agreed to the  following aftercare plan.   AFTERCARE:  The patient was to follow up with the [facility] Treatment Team which was to begin working with him on the day  following discharge.  The patient was also referred to his medical  physician for management of his hypertension.   DISCHARGE MEDICATIONS:  1. Risperdal 1 mg b.i.d. and 2 mg q.h.s.  2. Cogentin 1 mg b.i.d.  3. Trazodone 50 mg q.h.s. p.r.n. insomnia.  4. Clonidine 0.1 mg q.h.s.   DISCHARGE DIAGNOSES:  AXIS I:  Schizophrenia, chronic paranoid type,  acute exacerbation, resolving, polysubstance abuse.  AXIS II:  Deferred.  AXIS III:  History of hypertension.  AXIS IV:  Stressors severe.  AXIS V:  Global assessment of functioning on discharge 50.      Anselm Jungling, MD  Electronically Signed     SPB/MEDQ  D:  09/13/2008  T:  09/13/2008  Job:  [id number]

## 2010-11-16 NOTE — Consult Note (Signed)
[place]  Patient:    [name], [name]                  MRN: [id number] Proc. Date: 11/28/00 Adm. Date:  [id number] Disc. Date: [id number] Attending:  Sela Hua CC:         Arsenio Loader, M.D.   Consultation Report  REASON FOR CONSULTATION:  Dog bite, right lower extremity cellulitis.  HISTORY OF PRESENT ILLNESS:  This is a 50 year old male with type 2 diabetes, hypertension, and schizophrenia who states he was bitten by a pit bull approximately four days ago.  He states he was seen in the emergency department and had his wound sewn up.  He had increasing pain, fever, and chills and presented to the emergency department and was admitted with right lower extremity cellulitis.  He has been started on IV antibiotics.  I was asked to see him for possible wound debridement.  PAST MEDICAL HISTORY:  Type 2 diabetes, hypertension, schizophrenia.  ALLERGIES:  No known drug allergies.  MEDICATIONS:  Antihypertensives and oral hypoglycemic, although, he does not take these regularly.  SOCIAL HISTORY:  He is homeless.  He has a history of cocaine abuse and tobacco use.  Denies alcohol use.  PHYSICAL EXAMINATION:  GENERAL:  He is a slightly obese male in no acute distress.  VITAL SIGNS:  Temperature 98.8.  Blood glucose is currently 99.  EXTREMITIES:  Right lower extremity in the calf area posterolaterally, there is a foul-smelling 4 cm circular wound that is held together with sutures. There are two other wounds in the posterior calf area that are held together with sutures and these are clean.  He has 2+ dorsalis pedis pulse.  PROCEDURE:  Debridement.  The sutures were removed and the full-thickness necrotic skin was debrided and he was insensee to this.  There was also a small amount of underlying muscle that was debrided and he was insensee to this.  A damp saline gauze followed by dry gauze and acryllics wrap  were applied.  IMPRESSION:  Infected dog bite with full-thickness skin necrosis and some muscular necrosis, status post debridement.  Blood sugar is under good control.  White count today is 8000 and was normal on admission as well.  RECOMMENDATIONS:  Continue IV antibiotics.  Will start hydro therapy, dressing changes, and wound surveillance.  He may need a few further debridements. DD:  11/28/00 TD:  11/28/00 Job: [id number]

## 2010-11-16 NOTE — H&P (Signed)
[facility]  Patient:    [name], [name]                     MRN: [id number] Adm. Date:  02/10/00 Attending:  Corwin Levins, M.D. [facility] CC:         [facility]   History and Physical  CHIEF COMPLAINT:  Glucose greater than 600 with polydipsia, polyuria.  HISTORY OF PRESENT ILLNESS:  [name] is a 50 year old black male, homeless, ran out of medications, "some time ago," at least more than one week and now with one-week history of polydipsia, polyuria, and nocturia as well as weakness and mild orthostatic symptoms, aggravated by 90-degree weather this week and sweating.  He denies any other fever or any other acute symptoms.  PAST MEDICAL HISTORY: 1. Hypertension. 2. Diabetes mellitus. 3. Obesity. 4. Paranoid schizophrenia per [facility].  PAST SURGICAL HISTORY:  None.  ALLERGIES:  HALDOL.  MEDICATIONS:  Clonidine 0.2 mg p.o. q.d., Glucotrol XL 5 mg p.o. b.i.d., Prolixin monthly.  SOCIAL HISTORY:  Tobacco:  One-half pack per day.  Alcohol:  Occasional.  Last marijuana and cocaine:  More than 90 days.  Divorced, disabled.  Last worked years ago.  FAMILY HISTORY:  Diabetes, hypertension, high cholesterol, coronary artery disease, stroke, no cancer.  REVIEW OF SYSTEMS:  Noncontributory except for perianal warts and requesting an HIV test, as well as increasing abdominal distention over the past year for unclear reasons although may simply be worsening obesity, and is currently hungry at the moment.  PHYSICAL EXAMINATION:  GENERAL:  [name] is a 50 year old black male, morbidly obese, pleasant, non-belligerent.  VITAL SIGNS:  Blood pressure 162/98, pulse 100, respirations 16, temperature 98.3.  HEENT:  ENT essentially negative.  Sclerae clear.  TMs clear.  Pharynx benign. He does have ketone breath.  NECK:  Without lymphadenopathy, JVD, thyromegaly.  CHEST:  Clear to auscultation.  CARDIAC:  Regular rate  and rhythm.  ABDOMEN:  Obese versus somewhat distended, questionable fluid wave.  Otherwise positive bowel sounds, no no organomegaly, no masses.  EXTREMITIES:  No edema.  LABORATORY DATA:  Initial CBG in the ER 555, treated with 12 units regular insulin and insulin drip 2 units/hour since then.  Follow-up CBG pending.  Lab glucose initial 671.  Blood gas, pH 7.37, PCO2 33.5, PO2 70.7.  White blood cell count 9.5, hemoglobin 14.1.  Sodium 129, potassium 4.0, gap of 8 only. Urinalysis:  4+ glucose, positive ketones.  Urine drug screen positive for THC, otherwise negative.  BUN and creatinine 12 and 1.2.  LFTs normal.  Blood ketones positive, small.  ASSESSMENT AND PLAN: 1. Diabetes mellitus, out of control, off his usual medications due to    noncompliance, ability to afford, etc.  Now with dehydration and minor to    mild ketoacidosis.  There are no other apparent aggravating factors such as    infectious, cardiac, etc.  Will continue with the insulin drip started,    check CBGs q.2h., recheck ketones, electrolytes, glucose in the a.m.    Continue IV fluids. 2. Hypertension.  Restart medications. 3. Polysubstance abuse. 4. Paranoid schizophrenia. 5. Perianal warts.  Check HIV per patient request. 6. Abdominal distention.  Check abdomen ultrasound. DD:  02/10/00 TD:  02/11/00 Job: [id number]

## 2010-11-16 NOTE — Discharge Summary (Signed)
[facility]  Patient:    [name], [name]                    MRN: [id number] Adm. Date:  02/10/00 Disc. Date: 02/13/00 Attending:  Justine Null, M.D. [facility]                           Discharge Summary  The patient left against medical advice on February 13, 2000.  REASON FOR ADMISSION:  Severe hyperglycemia.  HISTORY OF PRESENT ILLNESS:  The patient is a 50 year old man admitted by Dr. Jonny Ruiz on February 10, 2000, with severe hyperglycemia.  Please refer to his dictated history and physical for details.  HOSPITAL COURSE:  The patient was admitted to the hospital and was found to have mild diabetic ketoacidosis.  This was successfully treated with insulin which was changed from IV to subcutaneous.  The patient steadily clinically improved.  For his hypertension, this was controlled with Vasotec.  During his hospitalization, there was steady improvement in his diabetes and hypertension, due to increases in the medications used to control these medical conditions.  He had an abnormal liver on the ultrasound.  This was followed up with an MRI.  During his admission, the patient repeatedly complained about being in the hospital and stated that he wanted to leave.  I discussed with him on August 12, and February 11, 2000, the medical need for him to remain in the hospital and receive proper medical therapy.  However, the patient insisted that he wanted to leave.  I talked with him, and I ascertained that the patient clearly was alert, oriented, and clearly competent to make medical decisions.  However, on February 13, 2000, the patient left the hospital against medical advice.  He was given follow-up appointments at the nutrition and diabetes management center at [facility] on August 20 and 23, 2001.  The patient then left before he could be prescribed any medications at the time of discharge.  However, the medications he was on at the time that he left the  hospital were:  1.  Vasotec 20 mg twice daily. 2. Insulin 75/25, 20 units subcutaneously twice daily. 3. Prolixin at an uncertain dosage intramuscular at [facility]. 4. Nicotine patch - 21 to change daily.  DIAGNOSES:  At the time of departure. 1. Diabetes with severe hyperglycemia and mild diabetic ketoacidosis. 2. Hypertension. 3. Paranoid schizophrenia.  ADDENDUM:  The patient had an abnormal ultrasound of the liver.  MRI confirmed he has a nodule in the liver which needs follow-up.  The patient left the hospital before the MRI result was available, and he was thus not advised of this.  I am dictating today a registered letter to him outlining the importance of follow-up of this abnormal MRI of the liver. DD:  03/16/00 TD:  03/17/00 Job: [id number]

## 2010-11-16 NOTE — H&P (Signed)
[facility]  Patient:    [name], [name]                  MRN: [id number] Adm. Date:  [id number] Attending:  Jasmine Pang CC:         [facility]   Psychiatric Admission Assessment  DATE OF ADMISSION:  May 06, 2000  CHIEF COMPLAINT:  "Im too sleepy to talk now."  PATIENT IDENTIFICATION:  A 50 year old African-American male with numerous previous admissions to our unit.  HISTORY OF PRESENT ILLNESS:  Patient has a history of crack cocaine dependence.  He also has a history of chronic paranoid schizophrenia and diabetes.  He has reportedly been off his medications for several weeks.  He thinks he is having auditory hallucinations telling him to kill himself.  He had reportedly just been on a crack cocaine binge and had run out of money. It was unclear whether he may be malingering.  PAST PSYCHIATRIC HISTORY:  Patient is seen at the [facility].  He is on Prolixin shots every two weeks (?dose).  He has not been compliant with these lately, but does state he received a shot while in jail last weekend.  SUBSTANCE ABUSE HISTORY:  Crack cocaine dependence x 14 years as indicated above.  Other drug and alcohol abuse.  PAST MEDICAL HISTORY:  Patient suffers from diabetes.  He is on Humulin insulin and also on a sliding scale insulin.  He is not sure of the dose at this time.  We will find out from the mental health center.  He has not been taking his medications recently.  He also has hypertension, but has not been compliant with treatment for this.  DRUG ALLERGIES:  No known drug allergies.   SOCIAL HISTORY:  Patient was living with him father who will not allow him to return home.  He is essentially homeless.  He is divorced and has a 25 year old child, but mother is the custodian.  FAMILY PSYCHIATRIC HISTORY:  Unknown at this time.  See previous chart.  MENTAL STATUS EXAMINATION:   Patient was lying in bed stating he was too tired to get up or talk to me.  He was friendly but had poor eye contact because he kept falling asleep.  Speech was soft and slow.  Mood was depressed, affect constricted.  He denied suicidal or homicidal ideations.  There was no self injurious behavior or aggression.  He denied any psychosis or perceptual disturbance at this time.  He stated the hallucinations had resolved.  Thought content revolved predominantly around wanting me to leave the room so he could continue sleeping.  His cognitive exam was impaired due to his sleepiness; however, he appeared to be alert and oriented x 4.  Short term and long term memory were adequate.  General fund of knowledge age and education level appropriate.  Attention and concentration were poor as assessed by his difficulty focusing.  Insight minimal, judgment poor.  ADMISSION DIAGNOSES: Axis I:    1. Paranoid schizophrenia.            2. Cocaine dependence. Axis II:   Features of dependent personality disorder. Axis III:  Diabetes, hypertension. Axis IV:   Severe. Axis V:    Global assessment of function current 25, past year 50.  ASSETS AND STRENGTHS:  Patient is healthy.  Patient is verbal.  PROBLEMS:  Schizophrenia and cocaine dependence with auditory command hallucinations to harm self.  SHORT TERM  TREATMENT GOAL:  Resolution of hallucinations and suicidal ideation.  LONG TERM TREATMENT GOAL:  Stabilization of chronic paranoid schizophrenia and cocaine dependence.  INITIAL PLAN OF CARE:  Restart patients insulin.  Psychotherapy to improve reality testing and decrease suicidal ideation.  ESTIMATED LENGTH OF STAY:  Two to three days.  CONDITIONS NECESSARY FOR DISCHARGE:  No auditory hallucinations, no suicidal ideation.  POST HOSPITAL CARE PLAN:  Will return to a group home that he has contacted. He will be seen for therapy and medication management at the [facility]. DD:  05/07/00 TD:  05/07/00 Job: [id number]

## 2010-11-16 NOTE — Discharge Summary (Signed)
[name], [name]           ACCOUNT NO.:  [id number]   MEDICAL RECORD NO.:  [id number]          PATIENT TYPE:  INP   LOCATION:  5702                         FACILITY:  [facility]   PHYSICIAN:  Duncan Dull, M.D.     DATE OF BIRTH:  1960-11-12   DATE OF ADMISSION:  04/01/2005  DATE OF DISCHARGE:  04/03/2005                                 DISCHARGE SUMMARY   DISCHARGE DIAGNOSES:  1.  Hemoptysis, etiology unclear.  2.  Hypertension.  3.  Schizophrenia.  4.  Polysubstance abuse.  5.  Diabetes type 2.  6.  Obstructive sleep apnea.  7.  Hypokalemia.   DISCHARGE MEDICATIONS:  Avelox 400 mg daily.   DISPOSITION:  The patient left the hospital against medical advice at 5:30  on October 4.  The patient was well-informed of the risks of leaving the  hospital against medical advice, including worsening infection and possible  death.  The patient was also informed of the risks of infecting others in  the event that his hemoptysis is secondary to tuberculosis infection.  The  patient was informed after conversations with the health department that he  could potentially be prosecuted and sent to prison for failure to comply  with medical recommendations and failure to rule out tuberculosis in the  hospital.  Despite these cautions, the patient insisted that he wanted to  leave the hospital.  He was given six days of samples for Avelox, and the  patient left.   CONSULTATIONS:  Pulmonology followed the patient while in-house for his  hemoptysis.  They recommended that the patient have two AFB smears done.  If  negative, their plan was to perform fiberoptic bronchoscopy.  If positive,  their plan was to treat for tuberculosis.   PROCEDURES:  2-D echo on October 4:  Overall normal left ventricular  systolic function with ejection fraction 55-65%.  No wall motion  abnormalities.  Mildly dilated left atrium, mild right ventricular  hypertrophy and mildly dilated right atrium.   IMAGING:   Chest x-ray October 2:  Right middle lobe and right lower lobe  pneumonia versus hemorrhage.  October 2, chest CT:  Diffuse air space  disease and consolidation within the right middle lobe and within the right  lower lobe, likely representing pneumonia, differential also including  hemorrhage.  Active tuberculosis not excluded on CT.   HISTORY OF PRESENT ILLNESS:  [name] is a 50 year old African-American  man with a history of chronic cocaine abuse, who was discharged from prison  three days ago and has spent the last 48 hours on a cocaine binge.  He uses  crack cocaine and smokes only.  Twelve hours prior to arrival, the patient  started coughing up frank blood, which he estimated anywhere from 100-200 mL  in total.  He also complained of mild pleuritic chest pain, denying fever,  sweats, weight loss, dizziness or lightheadedness.  On admission, the  patient expressed remorse about his cocaine use and had expressed interest  in the rehab program.   PHYSICAL EXAMINATION:  VITAL SIGNS ON ADMISSION:  Temperature 101.3, BP  151/92, pulse 116, breathing 20,  98% on 2 L, 86% on room air.  GENERAL:  On admission, significant for obesity, somnolence.  HEENT:  Blood-crusted lips, hyperemic conjunctivae.  CHEST:  Bronchial breath sounds in his right lung base with sparse rales,  otherwise clear to auscultation.  CARDIOVASCULAR:  Normal.  EXTREMITIES:  There was a plantar wart on his right foot.  NEUROLOGIC:  He was well-oriented, nonfocal sensory and motor exam, and  psychiatric exam was appropriate.   ADMISSION LABORATORY DATA:  ABG, pH 7.44, PCO2 36.9, PO2 49, bicarbonate  24.9.  CBC:  White blood cells, 19.2, hemoglobin 11.8, hematocrit 35.2,  platelets 141.  Differential showed 95% neutrophils.  PT 13.8, INR 1, PTT  29.  Sodium 140, potassium 3.4, chloride 109, CO2 22, glucose 157, BUN 16,  creatinine 1, total bilirubin 1.4, alkaline phosphatase 90, AST 39, ALT 12,  total protein  6.7, albumin 3.9, calcium 8.7.  Phosphorus 2.6.  Magnesium  2.1.  Hemoglobin A1c 5.5.  Negative hepatitis A antibody, negative hepatitis  C antibody.  Nonreactive HIV.  Drug screen positive for cocaine.  Urinalysis:  100 protein, rare bacteria, otherwise normal.  Microbiology:  Blood cultures were drawn at admission showing no growth.  Sputum Gram stain  showed moderate white blood cells with rare gram-positive cocci in pairs.  AFB smear of sputum was sent to lab on the day of patient's discharge.  Results are pending.   HOSPITAL COURSE:  Problem 1.  HEMOPTYSIS:  Given patient's very recent use of cocaine, his  hemoptysis is likely secondary to alveolar hemorrhage related to crack use.  However, the patient's fever, elevated white blood cell count and CT scan  were also very suspicious for community-acquired pneumonia, possibly with  necrotizing components.  Also, given patient's recent time in prison, it is  impossible for [place] to rule out tuberculosis.  The patient was typed and  crossed on arrival.  His hemoglobin was monitored q.6h.  Sputum cultures  were grown and showed no growth.  AFB sputum was finally sent to the lab on  the day the patient left.  Results are pending.  PPD was placed one day  prior to the patient's leaving.  There was no induration seen when the  patient left.  It was due to be read the day after discharge.  The patient  was placed on azithromycin and ceftriaxone on arrival.  These antibiotics  were continued to discharge for a total of three-day course.  A large-bore  IV was kept in place in the event that the patient re-bled.  The patient was  initially placed in the ICU for close monitoring.  After 12 hours it was  clear that the patient's hemoptysis had resolved for the time being, and the  patient was placed on contact precautions on the floor.  Problem 2.  HYPERTENSION:  The patient's home regimen of hydrochlorothiazide  was continued in-house.  Clonidine  0.1 mg b.i.d. was also added.  It was the  team's plan to go ahead and add triamterine in addition to the  hydrochlorothiazide given patient's history of recurrent hypokalemia;  however, when the patient left we had not had time to implement this plan.   Problem 3.  SCHIZOPHRENIA:  Risperdal was continued throughout the patient's  hospitalization at a dose of 2 mg q.h.s.  The patient was asymptomatic and  appropriate throughout hospitalization.   Problem 4.  POLYSUBSTANCE ABUSE:  On admission, the patient expressed  remorse and desired to enter rehab.  Social work was  planning to discuss  placement options with this patient; however, the social worker did not have  a chance to do so.  A nicotine patch was placed while in-house.  Hepatitis  serologies and HIV serologies were also drawn, which were negative.   Problem 5.  DIABETES TYPE 2:  The patient was initially placed on sliding  scale insulin until his diet could be reintroduced.  As the patient began to  take p.o. food on the day of discharge, the patient's sliding scale insulin  was discontinued and his home regimen of glipizide was continued.   Problem 6.  OBSTRUCTIVE SLEEP APNEA:  The patient says he has been diagnosed  with this condition and was prescribed a CPAP machine; however, the machine  broke and he has been unable to get it fixed.  He has been using oxygen at  night while in prison.  He was placed on CPAP at night while in-house.   Problem 7.  HYPOKALEMIA:  Potassium was replaced while in-house.  Again, our  plan was to supplement as needed and add triamterine to the patient's  antihypertensive regimen; however, we did not have enough time to implement  this plan.   DISPOSITION:  The patient was given six-day samples of Avelox on discharge.  He was also given a box of face masks, encouraged to stay in isolation, and  to use the face mask whenever he was in public.  The team also offered to  help acquire a cab voucher  so that he could get to a hotel; however, the  patient left before this voucher was filled.  The patient says he will stay  at a hotel tonight and then go to live with his cousin.  Again, the patient  counseled on the risks of leaving against medical advice, including  infection, death and possible prosecution for not complying with  tuberculosis treatment.  The patient was given the outpatient clinic phone  number.  He will follow up if his symptoms worsen.      Judie Grieve, MD      Duncan Dull, M.D.  Electronically Signed    SY/MEDQ  D:  04/03/2005  T:  04/04/2005  Job:  [id number]   cc:   Outpatient Clinic

## 2010-11-16 NOTE — Discharge Summary (Signed)
[facility]  Patient:    BACILIO, [name]                  MRN: [id number] Adm. Date:  [id number] Disc. Date: [id number] Attending:  Carmelina Peal                           Discharge Summary  DISCHARGE DIAGNOSES: 1. Poorly controlled insulin-dependent diabetes mellitus. 2. History of paranoid schizophrenia. 3. Systemic hypertension.  REASON FOR ADMISSION:  This is one of multiple hospitalizations for [name] who is a 50 year old homeless paranoid schizophrenia gentleman who was brought in by ambulance when his blood sugars at home were found to be reading very high and he had noted increasing fatigue and lethargy.  The patient was evaluated in the emergency room of [facility] where his sugars were noted to be greater than 600 with a CO2 of 24.  Because of his markedly elevated blood sugar he was admitted.  PERTINENT FINDINGS ON PHYSICAL EXAMINATION:  GENERAL APPEARANCE:  He is a well-developed robust African-American gentleman without out any Kussmaul respirations.  VITAL SIGNS:  Blood pressure 155/94, pulse rate of 96, respiratory rate of 24, temperature of 99.2.  There was no odor of ketoacidemia.  HEENT:  Pupils are equal, round, reactive to light and accommodation.  Sclerae anicteric.  No proliferative vascular changes.  CHEST:  No splinting, tenderness, no deformities.  Lungs are clear to percussion and auscultation.  CARDIOVASCULAR:  No visible pulsation, heaves, thrills.  No murmurs or rubs.  ABDOMEN:  Protuberant, soft, nontender with no organomegaly or masses.  NEUROLOGIC:  He was alert, oriented to name and place, but inappropriate in reasoning and sequencing.  There were no hallucinations, no focal sensory or motor deficits.  LABORATORY AND X-RAY DATA:  Serum sodium was 128 with a potassium of 4.6, chloride of 98, CO2 of 24, BUN of 15, and a creatinine of 0.9 and calcium of 9.4.  HOSPITAL COURSE:  He was  admitted to the [facility] with a working diagnosis of poorly controlled insulin-requiring type 2 diabetes mellitus with a history of paranoid schizophrenia and systemic hypertension.  The patient was started on parenteral IV fluids and a glucommander protocol was initiated for sustained continuous infusion of insulin.  A Catapres-TTS patch at 0.2 mg weekly was initiated and a maintenance regimen of Prolixin at 2.5 mg b.i.d. and Altace 2.5 mg b.i.d. for a systemic hypertension.  Once the patients blood sugars had reached a range of 200 he was started on a fractional insulin regimen using Humalog to cover his preprandial blood sugars and the glucommander was discontinued.  The patients appetite remained quite good throughout his hospital stay.  On the second hospital day he was started on a regimen of Lantus 30 units at bedtime in addition to his Humalog fractional insulin regimen, and on this regimen the patient did quite well.  Because the Lantus and Humalog would require that the patient take more than three to four injections per day and for the sake of compliance in a patient who is not very compliant, it was felt that a regimen of b.i.d. mixed insulin would be the insulin of choice on discharge.  DISCHARGE MEDICATIONS: 1. The patient was discharged on a fixed dose of 30 units of 75/25 mixed    each morning and a sliding scale regimen of 75/25 in the evening with a    subsequent follow-up scheduled at  [facility].  The patient is requested to    check his blood sugars at least twice a day before breakfast and before    supper and to keep a record of these numbers for his [facility] doctor. 2. Avapro 300 mg once a day. 3. Clonidine 0.25 mg twice a day. 4. Maxzide 25 once daily.  DISCHARGE INSTRUCTIONS:  He is discharged on an 1800-2000 calorie ADA and no-salt-added diet.  He is encouraged to continue his mental health visits on schedule once a month.  PROGNOSIS:  His prognosis  is considered to be poor because of his unreliability which is related to his underlying mental health illness. DD:  03/27/00 TD:  03/27/00 Job: [id number]

## 2010-11-16 NOTE — Discharge Summary (Signed)
NAME:  [name], [name]                     ACCOUNT NO.:  [id number]   MEDICAL RECORD NO.:  [id number]                   PATIENT TYPE:  IPS   LOCATION:  0407                                 FACILITY:  [facility]   PHYSICIAN:  Geoffery Lyons, M.D.                   DATE OF BIRTH:  [birth year]   DATE OF ADMISSION:  01/05/2004  DATE OF DISCHARGE:  01/10/2004                                 DISCHARGE SUMMARY   CHIEF COMPLAINT AND PRESENT ILLNESS:  This was the first admission to [facility] for this 50 year old divorced African-American male  voluntarily admitted.  Endorsed worries that he did not want his inner man  to come out because he is not afraid.  Noncompliant with medication.  Long  history of abusing cocaine.  Arterial hypertension.  Non-insulin-dependent  diabetes mellitus.  Noncompliant with treatment.   PAST PSYCHIATRIC HISTORY:  First time at [facility].  Noncompliant with follow-up through [facility].   ALCOHOL/DRUG HISTORY:  Ongoing use of alcohol and cocaine.   MEDICAL HISTORY:  Non-insulin-dependent diabetes mellitus, arterial  hypertension.   MEDICATIONS:  Risperdal 3 mg at night, Glucotrol 5 mg twice a day,  hydrochlorothiazide 25 mg daily, Cogentin 1 mg twice a day, yet  noncompliant.   PHYSICAL EXAMINATION:  Performed and failed to show any acute findings.   LABORATORY DATA:  Blood chemistry with sodium 146, potassium 2.9, replaced  to 3.4.  Lipid profile within normal limits.  Hemoglobin A1C 6.8.  Triglycerides 117.  Drug screen positive for cocaine.  TSH 0.176.   MENTAL STATUS EXAM:  Male in bed, passively cooperative.  No eye contact.  Speech clear, normal rate, tempo and production but very reserved, very  guarded.  Somewhat irritable, somewhat suspicious.  Some underlying  paranoia.  Delusional ideas.  Endorsed no hallucinations.  Cognition well-  preserved.   ADMISSION DIAGNOSES:   AXIS I:  1.  Schizophrenia versus schizoaffective disorder.  2. Polysubstance abuse.   AXIS II:  No diagnosis.   AXIS III:  1. Arterial hypertension.  2. Non-insulin-dependent diabetes mellitus.   AXIS IV:  Moderate.   AXIS V:  Global Assessment of Functioning upon admission 25; highest Global  Assessment of Functioning in the last year 55.   HOSPITAL COURSE:  He was admitted and started intensive individual and group  psychotherapy.  He was given Risperdal M-Tab 1 mg every six hours as needed,  Ativan 1 mg every four hours as needed for agitation, Risperdal 3 mg at  night, Glucotrol 5 mg twice a day, hydrochlorothiazide 25 mg daily, Cogentin  1 mg twice a day.  Initially very inappropriately, quite loose, noncompliant  with medications for three months.  Homeless.  Used some of the money on  crack.  Unclear of hallucinations.  Hostile.  We continued to increase the  medications.  Still inappropriate and disorganized.  Endorsed that the  hallucinations were less intense.  On December 30, 2003, he said he wanted to go  to an [employer].  Still somewhat labile, disorganized but compliant with  medication.  On January 10, 2004, he was in full contact with reality.  Endorsed no suicidal ideation.  Endorsed no homicidal ideation.  No evidence  of active delusional material.  Has been compliant with medication.  Wanted  to be discharged.  Was going to go to the shelter and eventually find  himself another placement.  He was basically baseline, so we went ahead and  discharged to outpatient treatment.   DISCHARGE DIAGNOSES:   AXIS I:  1. Schizoaffective disorder.  2. Cocaine dependence.   AXIS II:  No diagnosis.   AXIS III:  1. Arterial hypertension.  2. Non-insulin-dependent diabetes mellitus.   AXIS IV:  Moderate.   AXIS V:  Global Assessment of Functioning upon discharge 50.   DISCHARGE MEDICATIONS:  1. Risperdal 1 mg in the morning and 3 mg at night.  2. Glucotrol XL 5 mg twice a day.  3.  Hydrochlorothiazide 25 mg daily.  4. Cogentin 1 mg twice a day.  5. K-Dur 20 mEq, 1 twice a day.   FOLLOW UP:  Diversion team at [facility].                                               Geoffery Lyons, M.D.    IL/MEDQ  D:  01/31/2004  T:  02/01/2004  Job:  [id number]

## 2010-11-16 NOTE — Discharge Summary (Signed)
[place]. [name]  Patient:    [name], [name]                  MRN: [id number] Adm. Date:  [id number] Disc. Date: [id number] Attending:  Donnetta Hutching Dictator:   Bonnell Public, M.D.                           Discharge Summary  DISCHARGE DIAGNOSES: 1. Right lower extremity ulcer secondary to dog bite. 2. Diabetes mellitus type 2. 3. Hypertension. 4. Paranoid schizophrenia. 5. History of suicidality in April 2002. 6. Cocaine abuse.  DISCHARGE MEDICATIONS:  None, the patient left against medical advice.  CONSULTATIONS:  Surgery, Dr. Abbey Chatters.  PROCEDURES:  None.  HISTORY OF PRESENT ILLNESS:  [name] is a 50 year old, African-American male who presents to the ED four days after having been bit by a pitt bull with reports of increasing pain and swelling to the right calf where he sustained a bite.  The patient denies any fever, chills, nausea, vomiting or abdominal pain.  The patient states he has been compliant with the medications that he was given when seen in the emergency room three days prior to this presentation.  I am not certain what those medications were.  The patient was very somnolent at the time of our interview and it was difficult to obtain a thorough review of systems and history from him.  Essentially, the patient is homeless and it appears that he has not been taking care of the wound appropriately as it appears dirty and uncovered.  The patient does report hematochezia, but does not remember how far back.  ALLERGIES:  No known drug allergies.  MEDICATIONS:  He says he is suppose to take a blood pressure medication and a diabetes medication, but he does not.  SOCIAL HISTORY:  He says that he has been seen at [facility] in the past. The patient is homeless and reports cocaine abuse and denies alcohol abuse. He smokes one pack of tobacco daily.  FAMILY HISTORY:  Noncontributory, however, his father is still living  at 9 years old with multiple heart attacks and his mother died at the age of 72 with stroke.  He has three healthy siblings.  PHYSICAL EXAMINATION:  VITAL SIGNS:  Temperature 101.0, blood pressure 129/75, pulse 86, respiratory rate 16, 93% on room air.  GENERAL:  This is a very drowsy, but oriented x 3, obese, African-American male in no apparent distress.  HEENT:  Normocephalic, atraumatic, PERRL, no icterus.  Very poor dentition. No oral lesions.  NECK:  No JVD or lymphadenopathy.  Supple.  LUNGS:  Clear to auscultation bilaterally, no wheezing or crackles.  CARDIOVASCULAR:  Regular rate and rhythm with S1, S2, no murmurs, rubs or gallops.  No pedal edema.  Pedal pulses 2+ bilaterally.  ABDOMEN:  Obese, soft, nontender, nondistended with active bowel sounds.  SKIN:  Right lateral leg with 6 x 6 cm round sutured wound and three posterior horizontal sutured wounds that are about 4 cm long each.  There is an area of erythema around the larger wound that his tender to touch with small amount of yellow exudate and edematous.  It is also warm.  There is no ankle swelling and pedal pulses are 2+ bilaterally.  NEUROLOGIC:  Alert and oriented x 3.  Cranial nerves II-XII intact.  No motor or sensory deficits.  GENITALIA:  His stool is brown, solid and guaiac negative.  LABORATORY  DATA AND X-RAY FINDINGS:  White blood count 8.4, hemoglobin 10.5, platelets 233.  Sodium 138, potassium 3.0, chloride 103, CO2 29, BUN 11, creatinine 1.0, glucose 338, calcium 8.8.  Total bilirubin 0.6, Alk phos 72, AST 29, ALT 15, total protein 5.9.  Wound cultures obtained on May 29, in the ED, showed moderate E. coli.  Urine drug screen on that date showed cocaine positive.  A right leg x-ray on May 27, did not show any fracture, but did show soft tissue injury consistent with a bite.  [name] COURSE:  #1 - RIGHT LEG CELLULITIS SECONDARY TO AN INFECTED DOG BITE: This was also secondary to  noncompliance with medications and poor living conditions.  The patient was started on IV antibiotics to cover for Pasteurella multocida as well as other organisms.  The patient was started on Unasyn IV q.6h. and was given Vicodin every four to six hours for pain control.  It is believed that the fever was a response to this infection. Surgical consult was obtained with Dr. Abbey Chatters who recommended removal of the sutures and hydrotherapy as well as wound care.  The patient signed out AMA on November 30, 2000.  #2 - ANEMIA:  There has been a rapid drop in his hemoglobin over the last two months.  His hemoglobin in April 2002, was 13.8.  On Nov 23, 2000, it was 9.0 g.  On Nov 27, 2000, it was 8.4 g.  His hemoglobin was 10.5 at the time of admission and remained stable during this hospitalization.  His stool was guaiac negative and there was no evidence of active blood loss during this hospitalization.  Laboratory work was pending at the time the patient left against medical advice.  Consider Vitamin B12 or folate deficiency given his poor living conditions and home status.  This could also be related to thalassemia that has not been previously diagnosed, although his hemoglobin would not drop so acutely at this age.  Iron studies were pending at the time of discharge.  #3 - HYPOKALEMIA:  The patient had a potassium of 3.0 at the time of admission.  We are not certain why this was the case.  The patient denied any vomiting or diarrhea that could be secondary to a poor diet although unlikely. The patient does not have any evidence of renal disease.  This may remotely be related to infection and reaction to acute distress.  The potassium was replaced orally successfully.  #4 - DIABETES TYPE 2:  The patient had an elevated blood glucose level at the time of admission.  His sugars continued to be in the upper 100s and mid 200s during the hospitalization.  The patient was started on Glucotrol 5 mg  every morning and was covered with sliding scale insulin as well as an ADA diet. The  patient left the [name] before and appropriate regimen could be established for control of his blood sugars.  He had a hemoglobin A1C of 6.3.  #5 - HYPERTENSION:  The patient had systolic blood pressure in the 150s during this hospitalization.  It was felt that this patient would benefit from an ACE inhibitor.  However, he has a history of noncompliance and left the [name] against medical advice before we were able to show him what dosage of medication would be effective for him.  This is unfortunate and it would be helpful if this patient would follow up in our clinic for control of all his medical problems and appropriate management.  #6 - SCHIZOPHRENIA:  This patient does not have any signs or symptoms of active psychosis during this hospitalization.  The patient is followed at the [facility] where he states that he gets a shot every month when he remembers to present for his appointments. DD:  12/19/00 TD:  12/19/00 Job: [id number]

## 2010-11-16 NOTE — Discharge Summary (Signed)
[facility]  Patient:    [name], [name]                  MRN: [id number] Adm. Date:  [id number] Disc. Date: [id number] Attending:  Milford Cage H                           Discharge Summary  REASON FOR ADMISSION:  This is one of numerous previous admissions to our adult unit for this 50 year old African-American male.  He has had a history of crack cocaine dependence and chronic paranoid schizophrenia, as well as diabetes.  He had reportedly been off his medications for several weeks and had been having auditory hallucinations telling him to kill himself.  He also reportedly had just been on a crack cocaine binge and run out of money.  For further information, see the dictated psychiatric admission assessment.  PHYSICAL EXAMINATION:  Patient was noted to have history of diabetes and hypertension.  No other acute medical problems noted.  LABORATORY DATA:  A CBC with differential was within normal limits except for slightly decreased hemoglobin of 12.1 (13-17) and a slightly decreased hematocrit of 36.6 (39-52).  Glucose was slightly elevated at 123 (70-115). Hypothyroid profile was within normal limits.  HIV status was nonreactive. RPR nonreactive.  GCT probe negative for GC and chlamydia.  ECT normal sinus rhythm.  T-wave abnormality considered inferior ischemia.  This will be further evaluated by our cardiologist.  HOSPITAL COURSE:  Upon admission, patient was continued on his home medications of Humalog insulin 75/25, 30 units in the morning q.d. and 25 units at 5 p.m.  He was also continued on Humulin regular sliding scale as follows:  CBGs 151-200 use 3 units, CBGs 201-250 use 6 units, CBGs 251-300 use 9 units.  If higher than 300, call physician.  Patient was also continued on Diazide 37.5/25 1 q.a.m., Clonidine 0.2 1 p.o. t.i.d., and Avapro 150 mg p.o. b.i.d. and amaryl 2 mg 1 p.o. q.a.m.  He initially had stated he had missed is last  Prolixin IM injection and one was ordered.  However, this was discontinued after he stated he had had one in jail several days ago.  On May 07, 2000, patient was given Ativan 2 mg for agitation and EPS and Cogentin 1 mg q.4h. p.r.n. EPS.  There were not other changes in medications made.  Patient spent much of his hospitalization in bed and did not participate in a meaningful or therapeutic manner.  On May 08, 2000, he was discharged to his Engineer, [facility].  It was unclear what legal procedings were to take place at this point.  DISCHARGE MENTAL STATUS EXAMINATION: Mental status had improved.  Patient was less depressed.  He was not suicidal.  He was not homicidal ideation.  His eye contact good.  His speech was still soft and slow but improved from admission. There was no psychosis or perceptual distortions.  He was felt to be able to be safely managed on a outpatient basis.  DISCHARGE DIAGNOSES: Axis I:    1. Paranoia schizophrenia.            2. Polysubstance dependence.            Axis II:   Deferred. Axis III:  Diabetes mellitus, hypertension. Axis IV:   Severe. Axis V:    Global assessment of function is 50/25.  DISCHARGE MEDICATIONS: 1. Humalog 75/25 30 mcg in the morning and 25 mcg  in p.m. 2. Amaryl 2 mg in the morning. 3. Avapro 150 mg tablet twice a day. 4. Clonidine 0.2 mg t.i.d. 5. Diazide 37.5/25 1 q.a.m.  DIET:  Follow diabetic approved diet.  ACTIVITY LEVEL:  No restrictions.  POST HOSPITAL CARE PLAN:  Follow up at [facility] on May 15, 2000, reentry appointment at 10 a.m.  DISCHARGE PLANS:  Discharge to his adult probation and parole officer, Elenora Gamma.  It is unclear what placement he will be taken to by her. DD:  06/02/00 TD:  06/02/00 Job: [id number]

## 2010-11-16 NOTE — Consult Note (Signed)
[name], REMMERT NO.:  [id number]   MEDICAL RECORD NO.:  [id number]          PATIENT TYPE:  INP   LOCATION:  2110                         FACILITY:  [facility]   PHYSICIAN:  Shan Levans, M.D. LHCDATE OF BIRTH:  17-Sep-1960   DATE OF CONSULTATION:  04/02/2005  DATE OF DISCHARGE:                                   CONSULTATION   CHIEF COMPLAINT:  Hemoptysis.   HISTORY OF PRESENT ILLNESS:  This is a 50 year old African-American male  homeless patient who had been a chronic crack cocaine user, recently  released from the prison system, now living on the streets.  Comes in with a  several-day history of increased cough, low-grade fever, and then onset of  hemoptysis.  Apparently had an episode similar to this several months ago  that resolved on its own.  He is a heavy smoker with underlying  hypertension, diabetes, schizophrenia and obstructive sleep apnea.  The  patient was admitted with a right middle and lower lobe infiltrate and fever  to 102 degrees with leukocytosis.  We are asked to assess for a need for  bronchoscopy.   PAST MEDICAL HISTORY:  Medical:  No allergies.  Medical history of  hypertension, type 2 diabetes, schizophrenia, obstructive sleep apnea.   MEDICATIONS PRIOR TO ADMISSION:  Glucotrol, Risperdal, hydrochlorothiazide.   SUBSTANCE HISTORY:  Currently smokes a pack a day for 30 years.  Used crack  cocaine for 20 years, said that his last dose was four months ago.   SOCIAL HISTORY:  Otherwise unemployed, lives in the streets.   FAMILY HISTORY:  History of stroke in father.   REVIEW OF SYSTEMS:  Noncontributory.   PHYSICAL EXAMINATION:  GENERAL:  He is a middle-aged African-American male  in no distress.  VITAL SIGNS:  Temperature:  T-max 101.3.  Blood pressure 150/92, pulse 116,  respirations 20, saturation 98% on room air.  CHEST:  Dry rales at the right base, otherwise clear.  CARDIAC:  Regular rate and rhythm without S3, normal S1,  S2.  ABDOMEN:  Soft, nontender.  EXTREMITIES:  No edema, clubbing or skin disease.  NEUROLOGIC:  Intact.  HEENT:  No jugular venous distention or lymphadenopathy.  Oropharynx clear.  Neck supple.   LABORATORY DATA:  Sodium 140, potassium 3.4, chloride 109, CO2 22, BUN 16,  creatinine 1.0, blood sugar 157.  Hemoglobin 11.8, white count 19,000.  Blood gas on room air, pH 7.44, PCO2 37, PO2 49.  Liver functions  unremarkable.  PT normal, PTT normal.  Sputum Gram stain shows UPCs in pairs  and chains.   IMPRESSION:  Hemoptysis with right middle and lower lobe community-acquired  pneumonia, no organism specified, doubt tuberculosis.  With underlying  smoking history, rule out endobronchial lesion.  Will yet likely need  bronchoscopy, but need to rule out tuberculosis first with AFB smear test.  Hemoptysis appears to have slowed down.   RECOMMENDATIONS:  Agree with continuing current antibiotic therapy per  primary team, which currently now is including the Zithromax, vancomycin and  Avelox.  Will obtain AFB smears and check this prior to scheduling  bronchoscopy and will schedule bronchoscopy  for later in the week once the  patient is stabilized clinically and AFB smears have been proven to be  negative.      Shan Levans, M.D. [facility]  Electronically Signed     PW/MEDQ  D:  04/02/2005  T:  04/02/2005  Job:  [id number]   cc:   Duncan Dull, M.D.  Fax: [phone]

## 2011-04-02 LAB — BASIC METABOLIC PANEL
BUN: 11 mg/dL (ref 6–23)
BUN: 11 mg/dL (ref 6–23)
BUN: 14
CO2: 24 mEq/L (ref 19–32)
CO2: 26 mEq/L (ref 19–32)
Calcium: 8.8 mg/dL (ref 8.4–10.5)
Calcium: 9 mg/dL (ref 8.4–10.5)
Calcium: 9.4 mg/dL (ref 8.4–10.5)
Chloride: 109 mEq/L (ref 96–112)
Creatinine, Ser: 0.82 mg/dL (ref 0.4–1.5)
GFR calc Af Amer: >60 mL/min (ref >60)
GFR calc Af Amer: >60
GFR calc non Af Amer: >60 mL/min (ref >60)
GFR calc non Af Amer: >60
Glucose, Bld: 136 mg/dL — ABNORMAL HIGH (ref 70–99)
Glucose, Bld: 138 mg/dL — ABNORMAL HIGH (ref 70–99)
Potassium: 3.7 mEq/L (ref 3.5–5.1)
Potassium: 4.1 mEq/L (ref 3.5–5.1)
Potassium: 3.3 — ABNORMAL LOW
Sodium: 137 mEq/L (ref 135–145)
Sodium: 140

## 2011-04-02 LAB — BLOOD GAS, ARTERIAL
Acid-Base Excess: 3.3 — ABNORMAL HIGH
Bicarbonate: 28.2 — ABNORMAL HIGH
FIO2: 1
O2 Saturation: 84.7
Patient temperature: 98.2
Patient temperature: 98.5
TCO2: 26.1
pCO2 arterial: 42.1
pH, Arterial: 7.386
pH, Arterial: 7.44
pO2, Arterial: 61.7 — ABNORMAL LOW

## 2011-04-02 LAB — COMPREHENSIVE METABOLIC PANEL
ALT: 31 U/L (ref 0–53)
AST: 24 U/L (ref 0–37)
AST: 32 U/L (ref 0–37)
AST: 22
Albumin: 2.8 g/dL — ABNORMAL LOW (ref 3.5–5.2)
Albumin: 2.9 — ABNORMAL LOW
Alkaline Phosphatase: 83 U/L (ref 39–117)
BUN: 9 mg/dL (ref 6–23)
BUN: 9 mg/dL (ref 6–23)
CO2: 28 mEq/L (ref 19–32)
Calcium: 8.7 mg/dL (ref 8.4–10.5)
Calcium: 8.8 mg/dL (ref 8.4–10.5)
Calcium: 8.8
Chloride: 104 mEq/L (ref 96–112)
Chloride: 108
Creatinine, Ser: 0.84 mg/dL (ref 0.4–1.5)
Creatinine, Ser: 0.88 mg/dL (ref 0.4–1.5)
Creatinine, Ser: 0.93
GFR calc Af Amer: >60 mL/min (ref >60)
GFR calc Af Amer: >60 mL/min (ref >60)
GFR calc Af Amer: >60
GFR calc non Af Amer: >60 mL/min (ref >60)
Glucose, Bld: 158 mg/dL — ABNORMAL HIGH (ref 70–99)
Glucose, Bld: 189 mg/dL — ABNORMAL HIGH (ref 70–99)
Potassium: 3.5 mEq/L (ref 3.5–5.1)
Potassium: 3.6 mEq/L (ref 3.5–5.1)
Potassium: 3.7 mEq/L (ref 3.5–5.1)
Sodium: 141 mEq/L (ref 135–145)
Sodium: 144 mEq/L (ref 135–145)
Sodium: 142
Total Bilirubin: 0.9 mg/dL (ref 0.3–1.2)
Total Bilirubin: 1.3 mg/dL — ABNORMAL HIGH (ref 0.3–1.2)
Total Protein: 5.9 g/dL — ABNORMAL LOW (ref 6.0–8.3)
Total Protein: 6.4 g/dL (ref 6.0–8.3)

## 2011-04-02 LAB — URINALYSIS, ROUTINE W REFLEX MICROSCOPIC
Glucose, UA: NEGATIVE mg/dL
Hgb urine dipstick: NEGATIVE
Protein, ur: NEGATIVE mg/dL
Specific Gravity, Urine: 1.025 (ref 1.005–1.030)

## 2011-04-02 LAB — GLUCOSE, CAPILLARY
Glucose-Capillary: 126 mg/dL — ABNORMAL HIGH (ref 70–99)
Glucose-Capillary: 132 mg/dL — ABNORMAL HIGH (ref 70–99)
Glucose-Capillary: 143 mg/dL — ABNORMAL HIGH (ref 70–99)
Glucose-Capillary: 146 mg/dL — ABNORMAL HIGH (ref 70–99)
Glucose-Capillary: 150 mg/dL — ABNORMAL HIGH (ref 70–99)
Glucose-Capillary: 151 mg/dL — ABNORMAL HIGH (ref 70–99)
Glucose-Capillary: 152 mg/dL — ABNORMAL HIGH (ref 70–99)
Glucose-Capillary: 163 mg/dL — ABNORMAL HIGH (ref 70–99)
Glucose-Capillary: 197 mg/dL — ABNORMAL HIGH (ref 70–99)
Glucose-Capillary: 197 mg/dL — ABNORMAL HIGH (ref 70–99)
Glucose-Capillary: 217 mg/dL — ABNORMAL HIGH (ref 70–99)
Glucose-Capillary: 264 mg/dL — ABNORMAL HIGH (ref 70–99)

## 2011-04-02 LAB — CK TOTAL AND CKMB (NOT AT ARMC)
Relative Index: 2
Total CK: 106

## 2011-04-02 LAB — CULTURE, BLOOD (ROUTINE X 2)
Culture: NO GROWTH
Culture: NO GROWTH

## 2011-04-02 LAB — DIFFERENTIAL
Basophils Relative: 0 % (ref 0–1)
Basophils Relative: 0
Eosinophils Absolute: 0 10*3/uL (ref 0.0–0.7)
Eosinophils Relative: 0 % (ref 0–5)
Eosinophils Relative: 0
Eosinophils Relative: 0
Lymphocytes Relative: 14
Lymphocytes Relative: 9 — ABNORMAL LOW
Lymphs Abs: 1 10*3/uL (ref 0.7–4.0)
Lymphs Abs: 1.4
Monocytes Absolute: 0.7 10*3/uL (ref 0.1–1.0)
Monocytes Relative: 6 % (ref 3–12)
Monocytes Relative: 5
Neutro Abs: 9.3 — ABNORMAL HIGH
Neutrophils Relative %: 86 % — ABNORMAL HIGH (ref 43–77)

## 2011-04-02 LAB — CBC
HCT: 36 % — ABNORMAL LOW (ref 39.0–52.0)
HCT: 34.9 — ABNORMAL LOW
Hemoglobin: 11.9 g/dL — ABNORMAL LOW (ref 13.0–17.0)
MCHC: 32.3 g/dL (ref 30.0–36.0)
MCHC: 32.3
MCV: 91.5 fL (ref 78.0–100.0)
MCV: 92
Platelets: 171 10*3/uL (ref 150–400)
Platelets: 192 10*3/uL (ref 150–400)
Platelets: 215 10*3/uL (ref 150–400)
Platelets: 216 10*3/uL (ref 150–400)
Platelets: 142 — ABNORMAL LOW
Platelets: 152
RBC: 3.86 MIL/uL — ABNORMAL LOW (ref 4.22–5.81)
RBC: 4.12 MIL/uL — ABNORMAL LOW (ref 4.22–5.81)
RDW: 15.5 % (ref 11.5–15.5)
RDW: 15.6 % — ABNORMAL HIGH (ref 11.5–15.5)
RDW: 15.7 % — ABNORMAL HIGH (ref 11.5–15.5)
RDW: 15.7 % — ABNORMAL HIGH (ref 11.5–15.5)
RDW: 15.7 % — ABNORMAL HIGH (ref 11.5–15.5)
WBC: 10.8 10*3/uL — ABNORMAL HIGH (ref 4.0–10.5)
WBC: 12.9 10*3/uL — ABNORMAL HIGH (ref 4.0–10.5)
WBC: 9.6 10*3/uL (ref 4.0–10.5)
WBC: 10.8 — ABNORMAL HIGH
WBC: 9.9

## 2011-04-02 LAB — RAPID URINE DRUG SCREEN, HOSP PERFORMED
Amphetamines: NOT DETECTED
Barbiturates: NOT DETECTED
Benzodiazepines: NOT DETECTED
Cocaine: NOT DETECTED
Opiates: NOT DETECTED
Tetrahydrocannabinol: NOT DETECTED

## 2011-04-02 LAB — STREP PNEUMONIAE ANTIBODY SEROTYPES
Strep pneumo Type 12: 0.44 ug/mL
Strep pneumo Type 4: 0.07 ug/mL
Strep pneumoniae Type 14 Abs: 5.31 ug/mL
Strep pneumoniae Type 18C Abs: 0.41 ug/mL
Strep pneumoniae Type 5 Abs: 3.23 ug/mL
Strep pneumoniae Type 7F Abs: 0.71 ug/mL
Strep pneumoniae Type 9N Abs: 0.39 ug/mL

## 2011-04-02 LAB — POCT I-STAT 3, ART BLOOD GAS (G3+)
Acid-Base Excess: 6 mmol/L — ABNORMAL HIGH (ref 0.0–2.0)
Patient temperature: 100.3
pH, Arterial: 7.453 — ABNORMAL HIGH (ref 7.350–7.450)

## 2011-04-02 LAB — TROPONIN I
Troponin I: 0.11 ng/mL — ABNORMAL HIGH (ref 0.00–0.06)
Troponin I: 0.18 — ABNORMAL HIGH

## 2011-04-02 LAB — PHOSPHORUS
Phosphorus: 3.1 mg/dL (ref 2.3–4.6)
Phosphorus: 3.3 mg/dL (ref 2.3–4.6)
Phosphorus: 4.4 mg/dL (ref 2.3–4.6)

## 2011-04-02 LAB — MAGNESIUM
Magnesium: 2.2 mg/dL (ref 1.5–2.5)
Magnesium: 2.2 mg/dL (ref 1.5–2.5)

## 2011-04-02 LAB — HIV ANTIBODY (ROUTINE TESTING W REFLEX): HIV: NONREACTIVE

## 2011-04-02 LAB — SEDIMENTATION RATE: Sed Rate: 20 mm/hr — ABNORMAL HIGH (ref 0–16)

## 2011-04-02 LAB — SJOGRENS SYNDROME-A EXTRACTABLE NUCLEAR ANTIBODY: SSA (Ro) (ENA) Antibody, IgG: <0.2 AI (ref <1.0)

## 2011-04-02 LAB — ANTI-NEUTROPHIL ANTIBODY

## 2011-04-02 LAB — AFB CULTURE WITH SMEAR (NOT AT ARMC)

## 2011-04-02 LAB — LEGIONELLA ANTIGEN, URINE: Legionella Antigen, Urine: NEGATIVE

## 2011-04-02 LAB — STREP PNEUMONIAE URINARY ANTIGEN: Strep Pneumo Urinary Antigen: NEGATIVE

## 2011-04-02 LAB — HEMOGLOBIN A1C: Hgb A1c MFr Bld: 5.6 % (ref 4.6–6.1)

## 2011-04-02 LAB — B-NATRIURETIC PEPTIDE (CONVERTED LAB)
Pro B Natriuretic peptide (BNP): 133 pg/mL — ABNORMAL HIGH (ref 0.0–100.0)
Pro B Natriuretic peptide (BNP): 216 pg/mL — ABNORMAL HIGH (ref 0.0–100.0)
Pro B Natriuretic peptide (BNP): 60 pg/mL (ref 0.0–100.0)
Pro B Natriuretic peptide (BNP): 156 — ABNORMAL HIGH

## 2011-04-02 LAB — LEGIONELLA PNEUMOPHILA ANTIBODIES: Legionella pneumo, IgG Type 1-6: <1:128 {titer}

## 2011-04-02 LAB — APTT: aPTT: 34 seconds (ref 24–37)

## 2011-04-02 LAB — PROTIME-INR: INR: 1.1 (ref 0.00–1.49)

## 2011-04-02 LAB — URINE CULTURE: Colony Count: NO GROWTH

## 2011-04-02 LAB — PROTEIN / CREATININE RATIO, URINE: Protein Creatinine Ratio: 0.13 (ref 0.00–0.15)

## 2011-04-02 LAB — ANA: Anti Nuclear Antibody(ANA): NEGATIVE

## 2011-04-03 LAB — COMPREHENSIVE METABOLIC PANEL
Albumin: 3.3 — ABNORMAL LOW
BUN: 15
Creatinine, Ser: 0.93
Total Bilirubin: 0.4
Total Protein: 6.1

## 2011-04-03 LAB — DIFFERENTIAL
Basophils Relative: 1
Eosinophils Absolute: 0.1
Eosinophils Relative: 1
Lymphocytes Relative: 28
Neutro Abs: 6.4
Neutrophils Relative %: 65
Promyelocytes Absolute: 0
nRBC: 0

## 2011-04-03 LAB — CBC
HCT: 39.2
MCV: 89.8
Platelets: 259
RDW: 16.5 — ABNORMAL HIGH

## 2011-04-04 LAB — CBC
HCT: 39.8 % (ref 39.0–52.0)
Hemoglobin: 13 g/dL (ref 13.0–17.0)
MCHC: 32.7 g/dL (ref 30.0–36.0)
MCV: 88.2 fL (ref 78.0–100.0)
RBC: 4.52 MIL/uL (ref 4.22–5.81)
RDW: 14.7 % (ref 11.5–15.5)
WBC: 6.4 K/uL (ref 4.0–10.5)

## 2011-04-04 LAB — RAPID URINE DRUG SCREEN, HOSP PERFORMED
Amphetamines: NOT DETECTED
Barbiturates: NOT DETECTED
Benzodiazepines: NOT DETECTED
Cocaine: NOT DETECTED
Opiates: NOT DETECTED
Tetrahydrocannabinol: NOT DETECTED

## 2011-04-04 LAB — URINALYSIS, ROUTINE W REFLEX MICROSCOPIC
Bilirubin Urine: NEGATIVE
Glucose, UA: NEGATIVE mg/dL
Hgb urine dipstick: NEGATIVE
Ketones, ur: NEGATIVE mg/dL
Leukocytes, UA: NEGATIVE
Nitrite: NEGATIVE
Protein, ur: 30 mg/dL — AB
Specific Gravity, Urine: 1.023 (ref 1.005–1.030)
Urobilinogen, UA: 1 mg/dL (ref 0.0–1.0)
pH: 6 (ref 5.0–8.0)

## 2011-04-04 LAB — DIFFERENTIAL
Basophils Relative: 0 % (ref 0–1)
Eosinophils Relative: 1 % (ref 0–5)
Lymphocytes Relative: 24 % (ref 12–46)
Monocytes Relative: 9 % (ref 3–12)
Neutro Abs: 4.2 10*3/uL (ref 1.7–7.7)
Neutrophils Relative %: 66 % (ref 43–77)

## 2011-04-04 LAB — URINE MICROSCOPIC-ADD ON

## 2011-04-04 LAB — POCT I-STAT, CHEM 8
BUN: 11 mg/dL (ref 6–23)
Creatinine, Ser: 1 mg/dL (ref 0.4–1.5)
Potassium: 3.4 mEq/L — ABNORMAL LOW (ref 3.5–5.1)
Sodium: 144 mEq/L (ref 135–145)
TCO2: 27 mmol/L (ref 0–100)

## 2011-04-04 LAB — ETHANOL

## 2011-04-05 LAB — DIFFERENTIAL
Basophils Absolute: 0 10*3/uL (ref 0.0–0.1)
Basophils Absolute: 0 10*3/uL (ref 0.0–0.1)
Basophils Relative: 1 % (ref 0–1)
Eosinophils Absolute: 0 10*3/uL (ref 0.0–0.7)
Lymphocytes Relative: 17 % (ref 12–46)
Lymphocytes Relative: 18 % (ref 12–46)
Lymphs Abs: 0.9 10*3/uL (ref 0.7–4.0)
Monocytes Absolute: 0.4 10*3/uL (ref 0.1–1.0)
Monocytes Relative: 8 % (ref 3–12)
Neutro Abs: 3.7 10*3/uL (ref 1.7–7.7)
Neutro Abs: 6.6 10*3/uL (ref 1.7–7.7)
Neutrophils Relative %: 74 % (ref 43–77)
Neutrophils Relative %: 76 % (ref 43–77)

## 2011-04-05 LAB — APTT: aPTT: 29 seconds (ref 24–37)

## 2011-04-05 LAB — POCT CARDIAC MARKERS: Troponin i, poc: <0.05 ng/mL (ref 0.00–0.09)

## 2011-04-05 LAB — CBC
HCT: 37.6 % — ABNORMAL LOW (ref 39.0–52.0)
HCT: 39 % (ref 39.0–52.0)
Hemoglobin: 11.9 g/dL — ABNORMAL LOW (ref 13.0–17.0)
Hemoglobin: 12.7 g/dL — ABNORMAL LOW (ref 13.0–17.0)
MCHC: 32.7 g/dL (ref 30.0–36.0)
MCHC: 32.7 g/dL (ref 30.0–36.0)
MCV: 90.4 fL (ref 78.0–100.0)
Platelets: 123 10*3/uL — ABNORMAL LOW (ref 150–400)
Platelets: 201 10*3/uL (ref 150–400)
RBC: 4.08 MIL/uL — ABNORMAL LOW (ref 4.22–5.81)
RDW: 14.9 % (ref 11.5–15.5)
RDW: 16.2 % — ABNORMAL HIGH (ref 11.5–15.5)
WBC: 8.7 10*3/uL (ref 4.0–10.5)

## 2011-04-05 LAB — POCT I-STAT, CHEM 8
Calcium, Ion: 1.2 mmol/L (ref 1.12–1.32)
Chloride: 107 mEq/L (ref 96–112)
HCT: 39 % (ref 39.0–52.0)

## 2011-04-05 LAB — B-NATRIURETIC PEPTIDE (CONVERTED LAB): Pro B Natriuretic peptide (BNP): 85 pg/mL (ref 0.0–100.0)

## 2011-04-05 LAB — URINALYSIS, ROUTINE W REFLEX MICROSCOPIC
Bilirubin Urine: NEGATIVE
Glucose, UA: NEGATIVE mg/dL
Ketones, ur: 40 mg/dL — AB
Leukocytes, UA: NEGATIVE
Nitrite: NEGATIVE
Protein, ur: 30 mg/dL — AB

## 2011-04-05 LAB — MAGNESIUM: Magnesium: 2.4 mg/dL (ref 1.5–2.5)

## 2011-04-05 LAB — GLUCOSE, CAPILLARY: Glucose-Capillary: 170 mg/dL — ABNORMAL HIGH (ref 70–99)

## 2011-04-05 LAB — COMPREHENSIVE METABOLIC PANEL
ALT: 74 U/L — ABNORMAL HIGH (ref 0–53)
Albumin: 3.8 g/dL (ref 3.5–5.2)
Alkaline Phosphatase: 92 U/L (ref 39–117)
Alkaline Phosphatase: 98 U/L (ref 39–117)
BUN: 12 mg/dL (ref 6–23)
BUN: 9 mg/dL (ref 6–23)
Calcium: 9.2 mg/dL (ref 8.4–10.5)
Chloride: 106 mEq/L (ref 96–112)
Creatinine, Ser: 0.81 mg/dL (ref 0.4–1.5)
Glucose, Bld: 125 mg/dL — ABNORMAL HIGH (ref 70–99)
Glucose, Bld: 196 mg/dL — ABNORMAL HIGH (ref 70–99)
Potassium: 3 mEq/L — ABNORMAL LOW (ref 3.5–5.1)
Potassium: 3.7 mEq/L (ref 3.5–5.1)
Total Bilirubin: 0.7 mg/dL (ref 0.3–1.2)
Total Protein: 6.3 g/dL (ref 6.0–8.3)

## 2011-04-05 LAB — RAPID URINE DRUG SCREEN, HOSP PERFORMED
Benzodiazepines: NOT DETECTED
Cocaine: NOT DETECTED
Opiates: NOT DETECTED

## 2011-04-05 LAB — D-DIMER, QUANTITATIVE: D-Dimer, Quant: 0.36 ug/mL-FEU (ref 0.00–0.48)

## 2011-04-05 LAB — URINE MICROSCOPIC-ADD ON

## 2011-04-05 LAB — PROTIME-INR
INR: 1 (ref 0.00–1.49)
Prothrombin Time: 13.8 seconds (ref 11.6–15.2)

## 2011-04-05 LAB — CK TOTAL AND CKMB (NOT AT ARMC): Relative Index: 1.1 (ref 0.0–2.5)

## 2011-04-05 LAB — ETHANOL: Alcohol, Ethyl (B): <5 mg/dL (ref 0–10)

## 2013-02-17 ENCOUNTER — Encounter (HOSPITAL_COMMUNITY): Payer: Self-pay | Admitting: Emergency Medicine

## 2013-02-17 ENCOUNTER — Emergency Department (HOSPITAL_COMMUNITY): Payer: Self-pay

## 2013-02-17 ENCOUNTER — Emergency Department (HOSPITAL_COMMUNITY)
Admission: EM | Admit: 2013-02-17 | Discharge: 2013-02-17 | Disposition: A | Discharge status: Home / Self Care | Payer: Self-pay | Attending: Emergency Medicine | Admitting: Emergency Medicine

## 2013-02-17 DIAGNOSIS — I10 Essential (primary) hypertension: Secondary | ICD-10-CM | POA: Insufficient documentation

## 2013-02-17 DIAGNOSIS — F1410 Cocaine abuse, uncomplicated: Secondary | ICD-10-CM | POA: Insufficient documentation

## 2013-02-17 DIAGNOSIS — F17200 Nicotine dependence, unspecified, uncomplicated: Secondary | ICD-10-CM | POA: Insufficient documentation

## 2013-02-17 DIAGNOSIS — R059 Cough, unspecified: Secondary | ICD-10-CM | POA: Insufficient documentation

## 2013-02-17 DIAGNOSIS — E119 Type 2 diabetes mellitus without complications: Secondary | ICD-10-CM | POA: Insufficient documentation

## 2013-02-17 DIAGNOSIS — R0602 Shortness of breath: Secondary | ICD-10-CM | POA: Insufficient documentation

## 2013-02-17 DIAGNOSIS — R05 Cough: Secondary | ICD-10-CM | POA: Insufficient documentation

## 2013-02-17 DIAGNOSIS — R739 Hyperglycemia, unspecified: Secondary | ICD-10-CM

## 2013-02-17 DIAGNOSIS — Z8659 Personal history of other mental and behavioral disorders: Secondary | ICD-10-CM | POA: Insufficient documentation

## 2013-02-17 HISTORY — DX: Schizophrenia, unspecified: F20.9

## 2013-02-17 HISTORY — DX: Essential (primary) hypertension: I10

## 2013-02-17 HISTORY — DX: Type 2 diabetes mellitus without complications: E11.9

## 2013-02-17 LAB — COMPREHENSIVE METABOLIC PANEL
ALT: 32 U/L (ref 0–53)
AST: 52 U/L — ABNORMAL HIGH (ref 0–37)
CO2: 29 mEq/L (ref 19–32)
Chloride: 103 mEq/L (ref 96–112)
GFR calc non Af Amer: >90 mL/min (ref >90)
Sodium: 142 mEq/L (ref 135–145)
Total Bilirubin: 0.7 mg/dL (ref 0.3–1.2)

## 2013-02-17 LAB — URINALYSIS, ROUTINE W REFLEX MICROSCOPIC
Glucose, UA: 100 mg/dL — AB
Ketones, ur: NEGATIVE mg/dL
Leukocytes, UA: NEGATIVE
pH: 5.5 (ref 5.0–8.0)

## 2013-02-17 LAB — CBC WITH DIFFERENTIAL/PLATELET
Basophils Absolute: 0 10*3/uL (ref 0.0–0.1)
HCT: 39.4 % (ref 39.0–52.0)
Lymphocytes Relative: 14 % (ref 12–46)
Neutro Abs: 5.4 10*3/uL (ref 1.7–7.7)
Platelets: 212 10*3/uL (ref 150–400)
RDW: 15.2 % (ref 11.5–15.5)
WBC: 6.9 10*3/uL (ref 4.0–10.5)

## 2013-02-17 LAB — URINE MICROSCOPIC-ADD ON

## 2013-02-17 MED ORDER — METFORMIN HCL 500 MG PO TABS: 500.0000 mg | ORAL_TABLET | Freq: Two times a day (BID) | ORAL | Status: DC

## 2013-02-17 MED ORDER — LORAZEPAM 2 MG/ML IJ SOLN
1.0000 mg | Freq: Once | INTRAMUSCULAR | Status: AC
  Administered 2013-02-17: 1 mg via INTRAVENOUS
  Filled 2013-02-17: qty 1

## 2013-02-17 MED ORDER — CLONIDINE HCL 0.1 MG PO TABS: 0.2000 mg | ORAL_TABLET | Freq: Two times a day (BID) | ORAL | Status: DC

## 2013-02-17 NOTE — ED Notes (Signed)
Pt has not had DB or hypertension meds in a week. Pt has not eaten in past two days and has used crack cocaine in past 3 hours. In route blood pressure 200/110 CBG 206

## 2013-02-17 NOTE — ED Provider Notes (Signed)
CSN: [id number]     Arrival date & time 02/17/13  0522 History     First MD Initiated Contact with Patient 02/17/13 0530     Chief Complaint  Patient presents with  . Hypertension   (Consider location/radiation/quality/duration/timing/severity/associated sxs/prior Treatment) HPI Comments: Patient presents to the ER for evaluation of elevated blood pressure. Patient reports that he has been out of his medications for one week. He is not experiencing chest pain. He does report that he has had cough, productive of thick sputum and shortness of breath associated with the cough. He has not had fever. This has been going on for several days. Patient reports that he is a heavy smoker. He also did smoke crack earlier today.  Patient is a 52 y.o. male presenting with hypertension.  Hypertension Associated symptoms include shortness of breath. Pertinent negatives include no chest pain and no headaches.    Past Medical History  Diagnosis Date  . Diabetes mellitus without complication   . Hypertension   . Schizophrenia    History reviewed. No pertinent past surgical history. History reviewed. No pertinent family history. History  Substance Use Topics  . Smoking status: Current Every Day Smoker    Types: Cigarettes  . Smokeless tobacco: Current User  . Alcohol Use: No    Review of Systems  Constitutional: Negative for fever.  Eyes: Negative for visual disturbance.  Respiratory: Positive for cough and shortness of breath.   Cardiovascular: Negative for chest pain.  Neurological: Negative for headaches.  All other systems reviewed and are negative.    Allergies  Haldol  Home Medications  No current outpatient prescriptions on file. BP 175/94  Pulse 97  Temp(Src) 98.7 F (37.1 C) (Oral)  Resp 18  SpO2 98% Physical Exam  Constitutional: He is oriented to person, place, and time. He appears well-developed and well-nourished. No distress.  HENT:  Head: Normocephalic and  atraumatic.  Right Ear: Hearing normal.  Left Ear: Hearing normal.  Nose: Nose normal.  Mouth/Throat: Oropharynx is clear and moist and mucous membranes are normal.  Eyes: Conjunctivae and EOM are normal. Pupils are equal, round, and reactive to light.  Neck: Normal range of motion. Neck supple.  Cardiovascular: Regular rhythm, S1 normal and S2 normal.  Exam reveals no gallop and no friction rub.   No murmur heard. Pulmonary/Chest: Effort normal and breath sounds normal. No respiratory distress. He exhibits no tenderness.  Abdominal: Soft. Normal appearance and bowel sounds are normal. There is no hepatosplenomegaly. There is no tenderness. There is no rebound, no guarding, no tenderness at McBurney's point and negative Murphy's sign. No hernia.  Musculoskeletal: Normal range of motion.  Neurological: He is alert and oriented to person, place, and time. He has normal strength. No cranial nerve deficit or sensory deficit. Coordination normal. GCS eye subscore is 4. GCS verbal subscore is 5. GCS motor subscore is 6.  Skin: Skin is warm, dry and intact. No rash noted. No cyanosis.  Psychiatric: He has a normal mood and affect. His speech is normal and behavior is normal. Thought content normal.    ED Course   Procedures (including critical care time)  Labs Reviewed  CBC WITH DIFFERENTIAL - Abnormal; Notable for the following:    Hemoglobin 12.8 (*)    Neutrophils Relative % 78 (*)    All other components within normal limits  COMPREHENSIVE METABOLIC PANEL - Abnormal; Notable for the following:    Potassium 3.1 (*)    Glucose, Bld 200 (*)  Albumin 3.4 (*)    AST 52 (*)    All other components within normal limits  URINALYSIS, ROUTINE W REFLEX MICROSCOPIC - Abnormal; Notable for the following:    Color, Urine AMBER (*)    Specific Gravity, Urine 1.031 (*)    Glucose, UA 100 (*)    Hgb urine dipstick MODERATE (*)    Bilirubin Urine SMALL (*)    Protein, ur >300 (*)    All other  components within normal limits  URINE MICROSCOPIC-ADD ON - Abnormal; Notable for the following:    Bacteria, UA FEW (*)    All other components within normal limits  TROPONIN I  KETONES, QUALITATIVE   Dg Chest 2 View  02/17/2013   *RADIOLOGY REPORT*  Clinical Data: Cough, congestion, shortness of breath and lower chest pain for several days.  CHEST - 2 VIEW  Comparison: None.  Findings: The heart size and pulmonary vascularity are normal. There are interstitial changes in the lung bases which may represent fibrosis or interstitial infiltration.  No blunting of costophrenic angles.  No pneumothorax.  No focal consolidation. Calcified and tortuous aorta.  Degenerative changes in the spine.  IMPRESSION: Interstitial changes in the lung bases which could represent interstitial infiltration or fibrosis.   Original Report Authenticated By: Burman Nieves, M.D.   Diagnosis: 1. Uncontrolled hypertension 2. Cocaine abuse 3. Hyperglycemia  MDM  Patient presents to ER with concerns over his blood pressure. He admits that he is not taking any of his medications. Blood pressure was moderately elevated. He admits to using crack cocaine. Patient given benzodiazepine for his blood pressure based on the crack cocaine use. He had some improvement. He is not experiencing any chest pain.   Patient does report cough. Chest x-ray does not show any evidence of pneumonia. No concern for congestive heart failure based on the workup.  Patient is a diabetic. He's not sure if medication is supposed to be taking. Blood sugar was only slightly elevated. No concern for DKA.  Tells me that his blood pressure medication is clonidine. He initially said he had been out of for a week, but now it appears that he might have taken any medication in the last 4 years. We'll give him a prescription for clonidine. Additionally he cannot tell me what he takes for his diabetes. Prescribe metformin.  Patient will also be given amoxicillin  and albuterol for his cough. Followup with [facility].  Gilda Crease, MD 02/17/13 [phone]

## 2013-02-17 NOTE — ED Notes (Signed)
Bed: RESB Expected date:  Expected time:  Means of arrival:  Comments: EMS/hypertensive and c/o weakness

## 2013-03-14 ENCOUNTER — Emergency Department (HOSPITAL_COMMUNITY)
Admission: EM | Admit: 2013-03-14 | Discharge: 2013-03-15 | Discharge status: Against Medical Advice | Payer: Medicare Other | Attending: Emergency Medicine | Admitting: Emergency Medicine

## 2013-03-14 ENCOUNTER — Encounter (HOSPITAL_COMMUNITY): Payer: Self-pay | Admitting: Emergency Medicine

## 2013-03-14 DIAGNOSIS — Z202 Contact with and (suspected) exposure to infections with a predominantly sexual mode of transmission: Secondary | ICD-10-CM | POA: Insufficient documentation

## 2013-03-14 DIAGNOSIS — F1490 Cocaine use, unspecified, uncomplicated: Secondary | ICD-10-CM

## 2013-03-14 DIAGNOSIS — Z79899 Other long term (current) drug therapy: Secondary | ICD-10-CM | POA: Insufficient documentation

## 2013-03-14 DIAGNOSIS — R300 Dysuria: Secondary | ICD-10-CM | POA: Insufficient documentation

## 2013-03-14 DIAGNOSIS — E119 Type 2 diabetes mellitus without complications: Secondary | ICD-10-CM | POA: Diagnosis present

## 2013-03-14 DIAGNOSIS — R739 Hyperglycemia, unspecified: Secondary | ICD-10-CM

## 2013-03-14 DIAGNOSIS — F17200 Nicotine dependence, unspecified, uncomplicated: Secondary | ICD-10-CM | POA: Insufficient documentation

## 2013-03-14 DIAGNOSIS — F22 Delusional disorders: Secondary | ICD-10-CM | POA: Insufficient documentation

## 2013-03-14 DIAGNOSIS — I10 Essential (primary) hypertension: Secondary | ICD-10-CM | POA: Insufficient documentation

## 2013-03-14 DIAGNOSIS — F209 Schizophrenia, unspecified: Secondary | ICD-10-CM | POA: Insufficient documentation

## 2013-03-14 DIAGNOSIS — R45851 Suicidal ideations: Secondary | ICD-10-CM | POA: Insufficient documentation

## 2013-03-14 DIAGNOSIS — F1410 Cocaine abuse, uncomplicated: Secondary | ICD-10-CM | POA: Insufficient documentation

## 2013-03-14 DIAGNOSIS — R369 Urethral discharge, unspecified: Secondary | ICD-10-CM | POA: Insufficient documentation

## 2013-03-14 DIAGNOSIS — F1910 Other psychoactive substance abuse, uncomplicated: Secondary | ICD-10-CM | POA: Diagnosis present

## 2013-03-14 LAB — COMPREHENSIVE METABOLIC PANEL
ALT: 10 U/L (ref 0–53)
AST: 14 U/L (ref 0–37)
Albumin: 3.5 g/dL (ref 3.5–5.2)
Alkaline Phosphatase: 87 U/L (ref 39–117)
BUN: 10 mg/dL (ref 6–23)
CO2: 28 mEq/L (ref 19–32)
Chloride: 100 mEq/L (ref 96–112)
Glucose, Bld: 349 mg/dL — ABNORMAL HIGH (ref 70–99)
Total Protein: 6.8 g/dL (ref 6.0–8.3)

## 2013-03-14 LAB — CBC WITH DIFFERENTIAL/PLATELET
Eosinophils Relative: 4 % (ref 0–5)
HCT: 39.2 % (ref 39.0–52.0)
Hemoglobin: 12.9 g/dL — ABNORMAL LOW (ref 13.0–17.0)
Lymphocytes Relative: 20 % (ref 12–46)
MCV: 86.5 fL (ref 78.0–100.0)
Monocytes Absolute: 0.5 10*3/uL (ref 0.1–1.0)
Monocytes Relative: 9 % (ref 3–12)
Neutro Abs: 3.5 10*3/uL (ref 1.7–7.7)
WBC: 5.3 10*3/uL (ref 4.0–10.5)

## 2013-03-14 LAB — GLUCOSE, CAPILLARY: Glucose-Capillary: 274 mg/dL — ABNORMAL HIGH (ref 70–99)

## 2013-03-14 LAB — RAPID URINE DRUG SCREEN, HOSP PERFORMED
Amphetamines: NOT DETECTED
Benzodiazepines: NOT DETECTED
Tetrahydrocannabinol: NOT DETECTED

## 2013-03-14 LAB — URINE MICROSCOPIC-ADD ON

## 2013-03-14 LAB — SALICYLATE LEVEL: Salicylate Lvl: <2 mg/dL — ABNORMAL LOW (ref 2.8–20.0)

## 2013-03-14 LAB — URINALYSIS, ROUTINE W REFLEX MICROSCOPIC
Bilirubin Urine: NEGATIVE
Hgb urine dipstick: NEGATIVE
Protein, ur: NEGATIVE mg/dL
Urobilinogen, UA: 1 mg/dL (ref 0.0–1.0)

## 2013-03-14 LAB — ETHANOL: Alcohol, Ethyl (B): <11 mg/dL (ref 0–11)

## 2013-03-14 MED ORDER — ONDANSETRON HCL 4 MG PO TABS: 4.0000 mg | ORAL_TABLET | Freq: Three times a day (TID) | ORAL | Status: DC | PRN

## 2013-03-14 MED ORDER — CEFTRIAXONE SODIUM 250 MG IJ SOLR
250.0000 mg | Freq: Once | INTRAMUSCULAR | Status: AC
  Administered 2013-03-14: 250 mg via INTRAMUSCULAR
  Filled 2013-03-14: qty 250

## 2013-03-14 MED ORDER — ZOLPIDEM TARTRATE 5 MG PO TABS: 5.0000 mg | ORAL_TABLET | Freq: Every evening | ORAL | Status: DC | PRN

## 2013-03-14 MED ORDER — ALUM & MAG HYDROXIDE-SIMETH 200-200-20 MG/5ML PO SUSP: 30.0000 mL | ORAL | Status: DC | PRN

## 2013-03-14 MED ORDER — ACETAMINOPHEN 325 MG PO TABS
650.0000 mg | ORAL_TABLET | ORAL | Status: DC | PRN
  Administered 2013-03-14: 650 mg via ORAL
  Filled 2013-03-14: qty 2

## 2013-03-14 MED ORDER — IBUPROFEN 200 MG PO TABS
600.0000 mg | ORAL_TABLET | Freq: Three times a day (TID) | ORAL | Status: DC | PRN
  Administered 2013-03-14 – 2013-03-15 (×2): 600 mg via ORAL
  Filled 2013-03-14 (×2): qty 3

## 2013-03-14 MED ORDER — METFORMIN HCL 500 MG PO TABS
1000.0000 mg | ORAL_TABLET | Freq: Two times a day (BID) | ORAL | Status: DC
  Administered 2013-03-14 – 2013-03-15 (×4): 1000 mg via ORAL
  Filled 2013-03-14 (×7): qty 2

## 2013-03-14 MED ORDER — NICOTINE 21 MG/24HR TD PT24
21.0000 mg | MEDICATED_PATCH | Freq: Every day | TRANSDERMAL | Status: DC
  Administered 2013-03-14 – 2013-03-15 (×2): 21 mg via TRANSDERMAL
  Filled 2013-03-14 (×2): qty 1

## 2013-03-14 MED ORDER — CLONIDINE HCL 0.1 MG PO TABS
0.2000 mg | ORAL_TABLET | Freq: Two times a day (BID) | ORAL | Status: DC
  Administered 2013-03-14 – 2013-03-15 (×3): 0.2 mg via ORAL
  Filled 2013-03-14 (×3): qty 2

## 2013-03-14 MED ORDER — LORAZEPAM 1 MG PO TABS
1.0000 mg | ORAL_TABLET | Freq: Three times a day (TID) | ORAL | Status: DC | PRN
  Administered 2013-03-14 – 2013-03-15 (×2): 1 mg via ORAL
  Filled 2013-03-14 (×2): qty 1

## 2013-03-14 MED ORDER — AZITHROMYCIN 250 MG PO TABS
1000.0000 mg | ORAL_TABLET | Freq: Once | ORAL | Status: AC
  Administered 2013-03-14: 1000 mg via ORAL
  Filled 2013-03-14: qty 4

## 2013-03-14 NOTE — ED Notes (Signed)
Spoke with Reita Cliche from ACT team. He sts pt would benefit more from talking to an "extendor" about getting back to his medication regimen. They should be here around 9:00 this morning.

## 2013-03-14 NOTE — ED Notes (Signed)
Pt sitting on the side of the bed eating lunch at this time. Sitter watching pt

## 2013-03-14 NOTE — [facility] Counselor (Signed)
No beds [facility] - [place] - no beds [facility] - [place] - no beds [facility] - [place] - no beds  [facility] - [place] - no beds [facility] - no beds [facility] - no beds [facility] - no beds [facility] - [place] - no beds [facility] [place] - no beds [facility] - no answer  Possibilities: [facility] - Pam - possible placement options [facility] - [place] no adult beds; 1 gero psych bed; low acuity [facility] - [place] - possible beds but low acuity [facility] - Bernadine - a few male beds - low acuity [facility] - Charles - case by case ( they usually don't admit on Sundays) 

## 2013-03-14 NOTE — ED Notes (Addendum)
Pt arrived via EMS with complaints of penis discharge/pain for 2 months. Pt reports pain when urinating. Pt reports smoking "crack" about 1 hours ago. Pt reports he would like detox help because he missed his dose of medication. (unknown medication) pt reports history of hypertension, pt denies HA, N/V.

## 2013-03-14 NOTE — ED Notes (Signed)
2 pt belonging bags moved to locker #31

## 2013-03-14 NOTE — ED Notes (Signed)
Patient belongings are tan pants, belt, black shoes, red stripe shirt, black jacket and partial pocket bible. Bags and patient have been wanded. Items are locked up in locker 27

## 2013-03-14 NOTE — ED Provider Notes (Signed)
TIME SEEN: 7:04 AM  CHIEF COMPLAINT: Penile discharge and dysuria, substance abuse, paranoia  HPI: Patient is a 52 y.o. male with a history of hypertension, diabetes, schizophrenia who presents emergency department with multiple complaints. Patient reports that he had unprotected anal sex with another man on July 2. Since that time had dysuria and discharge from his penis. He reports he has had multiple STDs in the past.  Patient also complains of being increasingly angry and paranoid over the past several months. He states he was recently released from prison and has not had his medications for his schizophrenia. He denies any suicidal or homicidal ideation. Denies any hallucinations. He also states that he wants detox from crack cocaine. He states that he smokes approximately $100 of crack every day. Last use was 2 hours ago. He does also occasionally drink alcohol but not regularly. Last reports alcohol use 2 hours ago.  ROS: See HPI Constitutional: no fever  Eyes: no drainage  ENT: no runny nose   Cardiovascular:  no chest pain  Resp: no SOB  GI: no vomiting GU: no dysuria Integumentary: no rash  Allergy: no hives  Musculoskeletal: no leg swelling  Neurological: no slurred speech ROS otherwise negative  PAST MEDICAL HISTORY/PAST SURGICAL HISTORY:  Past Medical History  Diagnosis Date  . Diabetes mellitus without complication   . Hypertension   . Schizophrenia     MEDICATIONS:  Prior to Admission medications   Medication Sig Start Date End Date Taking? Authorizing Provider  cloNIDine (CATAPRES) 0.1 MG tablet Take 2 tablets (0.2 mg total) by mouth 2 (two) times daily. 02/17/13   Gilda Crease, MD  metFORMIN (GLUCOPHAGE) 500 MG tablet Take 1 tablet (500 mg total) by mouth 2 (two) times daily with a meal. 02/17/13   Gilda Crease, MD    ALLERGIES:  Allergies  Allergen Reactions  . Haldol [Haloperidol] Other (See Comments)    Unknown reaction    SOCIAL  HISTORY:  History  Substance Use Topics  . Smoking status: Current Every Day Smoker    Types: Cigarettes  . Smokeless tobacco: Current User  . Alcohol Use: No    FAMILY HISTORY: No family history on file.  EXAM: BP 181/100  Pulse 120  Resp 18  SpO2 100% CONSTITUTIONAL: Alert and oriented and responds appropriately to questions. Well-appearing; well-nourished HEAD: Normocephalic EYES: Conjunctivae clear, PERRL ENT: normal nose; no rhinorrhea; moist mucous membranes; pharynx without lesions noted NECK: Supple, no meningismus, no LAD  CARD: RRR; S1 and S2 appreciated; no murmurs, no clicks, no rubs, no gallops RESP: Normal chest excursion without splinting or tachypnea; breath sounds clear and equal bilaterally; no wheezes, no rhonchi, no rales,  ABD/GI: Normal bowel sounds; non-distended; soft, non-tender, no rebound, no guarding GU:  No external genital lesions, no penile discharge, no testicular swelling or pain, no scrotal masses; no hernia appreciated, 2+ femoral pulses bilaterally BACK:  The back appears normal and is non-tender to palpation, there is no CVA tenderness EXT: Normal ROM in all joints; non-tender to palpation; no edema; normal capillary refill; no cyanosis    SKIN: Normal color for age and race; warm NEURO: Moves all extremities equally, no facial droop or slurred speech PSYCH: Patient appears anxious but denies SI or HI. No hallucinations. Has paranoid thoughts. Rapid speech. Grooming and personal hygiene are appropriate.  MEDICAL DECISION MAKING: Patient with exposure to possible STD. Will obtain STD panel and appear clear treat for gonorrhea and Chlamydia. Patient also complaining of increased paranoia  and requesting detox. Will medically clear and discuss with behavioral health given patient's paranoia, rapid speech.  Patient reports that he is very angry. When questioned if he wants to hurt anyone he is very vague about this but then states "no, no I don't want  to hurt anyone but I'm just very angry in people are scared of me." Patient is tachycardic and hypertensive but I suspect this is secondary to his recent crack cocaine use.   Date: 03/14/2013 7:51 AM  Rate: 92  Rhythm: normal sinus rhythm  QRS Axis: normal  Intervals: normal  ST/T Wave abnormalities: T wave inversions in V5 and V6  Conduction Disutrbances: none  Narrative Interpretation: T wave inversions in V5 and V6; anterior Q waves, LVH, no change compared to prior EKG in 2010     ED PROGRESS: Patient's urine drug screen is positive for cocaine. His hypertension and tachycardia have improved. He is also hyperglycemic. Pt with normal anion gap.  I have ordered his home a clonidine and metformin. He is currently medically clear.  Patient seen by behavioral health and now endorses suicidality with plan to walk into traffic. He agrees to voluntary commitment at this time. They're working on placement.    Layla Maw [facility], DO 03/14/13 [phone]

## 2013-03-14 NOTE — BH Assessment (Signed)
Assessment Note  [name] is an 52 y.o. male.   Pt reports feelings or suicidal and verbalized plan to end life by walking into traffic.  Pt reports he has prior hx of SI and is currently being followed by [facility] for med mgt.  Pt reports he missed his last apt. Pt has been out of meds for one month.  Pt denies HI.  Pt reports hx of AVH and the voices come and go but are not current during assessment.  Pt reports long hx of alcohol and crack cocaine use.  Pt does not appear to be going through withdraws but CIWA =4  Pt made fair eye contact, interacted well, disheveled in appearance, body odor, Ox3, anxious, speech clear  Jail hx but not on probation  Recommend inptx admit.  [facility] extender to review and make recommendation  Axis I: Mood Disorder NOS, Schizoaffective Disorder and Substance Abuse Axis II: Deferred Axis III:  Past Medical History  Diagnosis Date  . Diabetes mellitus without complication   . Hypertension   . Schizophrenia    Axis IV: occupational problems, other psychosocial or environmental problems, problems related to legal system/crime, problems related to social environment and problems with primary support group Axis V: 31-40 impairment in reality testing  Past Medical History:  Past Medical History  Diagnosis Date  . Diabetes mellitus without complication   . Hypertension   . Schizophrenia     History reviewed. No pertinent past surgical history.  Family History: No family history on file.  Social History:  reports that he has been smoking Cigarettes.  He has been smoking about 0.00 packs per day. He uses smokeless tobacco. He reports that he uses illicit drugs (Cocaine and Marijuana). He reports that he does not drink alcohol.  Additional Social History:  Alcohol / Drug Use Pain Medications: na Prescriptions: na Over the Counter: na History of alcohol / drug use?: Yes Longest period of sobriety (when/how long): 38yr Substance #1 Name of  Substance 1: alcohol 1 - Age of First Use: teens 1 - Amount (size/oz): varies 150oz per day or more 1 - Frequency: daily 1 - Duration: years 1 - Last Use / Amount: 03-12-13 Substance #2 Name of Substance 2: crack cocaine 2 - Age of First Use: 60s 2 - Amount (size/oz): varies - 100.00 per day or more 2 - Frequency: 4x per week 2 - Duration: years 2 - Last Use / Amount: 03-12-13  CIWA: CIWA-Ar BP: 181/100 mmHg Pulse Rate: 120 Nausea and Vomiting: no nausea and no vomiting Tactile Disturbances: none Tremor: no tremor Auditory Disturbances: not present Paroxysmal Sweats: no sweat visible Visual Disturbances: not present Anxiety: two Headache, Fullness in Head: mild Agitation: normal activity Orientation and Clouding of Sensorium: oriented and can do serial additions CIWA-Ar Total: 4 COWS:    Allergies:  Allergies  Allergen Reactions  . Haldol [Haloperidol] Other (See Comments)    Unknown reaction    Home Medications:  (Not in a hospital admission)  OB/GYN Status:  No LMP for male patient.  General Assessment Data Location of Assessment: [facility] ED Is this a Tele or Face-to-Face Assessment?: Face-to-Face Is this an Initial Assessment or a Re-assessment for this encounter?: Initial Assessment Living Arrangements: Other relatives (cousin) Can pt return to current living arrangement?: Yes Admission Status: Voluntary Is patient capable of signing voluntary admission?: Yes Transfer from: Acute Hospital Referral Source: MD  Medical Screening Exam [facility] Walk-in ONLY) Medical Exam completed: Yes  [facility] Crisis Care Plan Living  Arrangements: Other relatives (cousin)  Education Status Is patient currently in school?: No  Risk to self Suicidal Ideation: Yes-Currently Present Suicidal Intent: Yes-Currently Present Is patient at risk for suicide?: Yes Suicidal Plan?: Yes-Currently Present Specify Current Suicidal Plan: traffic Access to Means: Yes Specify Access to Suicidal  Means: can walk What has been your use of drugs/alcohol within the last 12 months?: yes Previous Attempts/Gestures: Yes How many times?: 3 Other Self Harm Risks: na Triggers for Past Attempts: Unpredictable Intentional Self Injurious Behavior: None Family Suicide History: No Recent stressful life event(s): Conflict (Comment) Persecutory voices/beliefs?: Yes Depression: Yes Depression Symptoms: Tearfulness;Isolating;Fatigue;Guilt;Loss of interest in usual pleasures;Feeling worthless/self pity;Feeling angry/irritable Substance abuse history and/or treatment for substance abuse?: Yes Suicide prevention information given to non-admitted patients: Not applicable  Risk to Others Homicidal Ideation: No Thoughts of Harm to Others: No Current Homicidal Intent: No Current Homicidal Plan: No Access to Homicidal Means: No Identified Victim: na History of harm to others?: No Assessment of Violence: None Noted Violent Behavior Description: cooperative Does patient have access to weapons?: No Criminal Charges Pending?: No Does patient have a court date: No  Psychosis Hallucinations: Auditory (hx of AVH) Delusions: Unspecified  Mental Status Report Appear/Hygiene: Poor hygiene;Disheveled;Body odor Eye Contact: Fair Motor Activity: Unsteady Speech: Logical/coherent Level of Consciousness: Alert Mood: Depressed;Anxious;Worthless, low self-esteem Affect: Anxious;Depressed Anxiety Level: Moderate Thought Processes: Coherent Judgement: Impaired Orientation: Person;Place;Situation Obsessive Compulsive Thoughts/Behaviors: None  Cognitive Functioning Concentration: Decreased Memory: Recent Intact;Remote Intact IQ: Average Insight: Poor Impulse Control: Poor Appetite: Fair Weight Loss: 0 Weight Gain: 0 Sleep: Decreased Total Hours of Sleep: 3 Vegetative Symptoms: None  ADLScreening [name] Assessment Services) Patient's cognitive ability adequate to safely complete daily activities?:  Yes Patient able to express need for assistance with ADLs?: Yes Independently performs ADLs?: Yes (appropriate for developmental age)  Prior Inpatient Therapy Prior Inpatient Therapy: Yes Prior Therapy Dates: 2013 Prior [facility] Facilty[facility]Provider(s): [facility] Reason for Treatment: depression  Prior Outpatient Therapy Prior Outpatient Therapy: Yes Prior Therapy Dates: 2014 Prior Therapy Facilty[facility]Provider([facility]): [facility] Reason for Treatment: med mgt  ADL Screening (condition at time of admission) Patient's cognitive ability adequate to safely complete daily activities?: Yes Is the patient deaf or have difficulty hearing?: No Does the patient have difficulty seeing, even when wearing glasses/contacts?: No Does the patient have difficulty concentrating, remembering, or making decisions?: No Patient able to express need for assistance with ADLs?: Yes Does the patient have difficulty dressing or bathing?: No Independently performs ADLs?: Yes (appropriate for developmental age) Does the patient have difficulty walking or climbing stairs?: No Weakness of Legs: None Weakness of Arms/Hands: None  Home Assistive Devices/Equipment Home Assistive Devices/Equipment: Cane (specify quad or straight)  Therapy Consults (therapy consults require a physician order) PT Evaluation Needed: No OT Evalulation Needed: No SLP Evaluation Needed: No Abuse/Neglect Assessment (Assessment to be complete while patient is alone) Physical Abuse: Denies Verbal Abuse: Denies Sexual Abuse: Denies Exploitation of patient/patient's resources: Denies Self-Neglect: Denies Values / Beliefs Cultural Requests During Hospitalization: None Spiritual Requests During Hospitalization: None Consults Spiritual Care Consult Needed: No Social Work Consult Needed: No Merchant navy officer (For Healthcare) Advance Directive: Patient does not have advance directive Pre-existing out of facility DNR order (yellow form or pink MOST form):  No    Additional Information 1:1 In Past 12 Months?: No CIRT Risk: No Elopement Risk: No Does patient have medical clearance?: Yes     Disposition:  Disposition Initial Assessment Completed for this Encounter: Yes Disposition of Patient: Inpatient treatment program Type of  inpatient treatment program: Adult  On Site Evaluation by:   Reviewed with Physician:    Titus Mould, Eppie Gibson 03/14/2013 8:42 AM

## 2013-03-14 NOTE — ED Notes (Signed)
Assumed care of pt. Pt Received in rm 27. Alert and oriented. Asking for something to eat. Lying in bed with eyes opened watching television. No other concerns at this time. Vwilliams,rn.

## 2013-03-14 NOTE — ED Notes (Signed)
Bed: ZO10 Expected date:  Expected time:  Means of arrival:  Comments: Bed 15, EMS, 31 M, Penis Pain/Incontinence/Detox

## 2013-03-15 DIAGNOSIS — R7309 Other abnormal glucose: Secondary | ICD-10-CM

## 2013-03-15 DIAGNOSIS — F1410 Cocaine abuse, uncomplicated: Secondary | ICD-10-CM

## 2013-03-15 DIAGNOSIS — F1910 Other psychoactive substance abuse, uncomplicated: Secondary | ICD-10-CM | POA: Diagnosis present

## 2013-03-15 DIAGNOSIS — F209 Schizophrenia, unspecified: Secondary | ICD-10-CM | POA: Diagnosis present

## 2013-03-15 DIAGNOSIS — F22 Delusional disorders: Secondary | ICD-10-CM

## 2013-03-15 DIAGNOSIS — E119 Type 2 diabetes mellitus without complications: Secondary | ICD-10-CM | POA: Diagnosis present

## 2013-03-15 DIAGNOSIS — I10 Essential (primary) hypertension: Secondary | ICD-10-CM

## 2013-03-15 LAB — GLUCOSE, CAPILLARY: Glucose-Capillary: 137 mg/dL — ABNORMAL HIGH (ref 70–99)

## 2013-03-15 LAB — URINE CULTURE
Colony Count: NO GROWTH
Culture: NO GROWTH

## 2013-03-15 MED ORDER — INSULIN ASPART 100 UNIT/ML ~~LOC~~ SOLN
0.0000 [IU] | Freq: Three times a day (TID) | SUBCUTANEOUS | Status: DC
  Administered 2013-03-15: 3 [IU] via SUBCUTANEOUS
  Administered 2013-03-15: 5 [IU] via SUBCUTANEOUS
  Filled 2013-03-15 (×2): qty 1

## 2013-03-15 NOTE — Consult Note (Signed)
[facility] Face-to-Face Psychiatry Consult   Reason for Consult:  Suicide thought, substance abuse Referring Physician:  EDP [name] is an 52 y.o. male.  Assessment: AXIS I:  Alcohol Abuse and Substance Abuse AXIS II:  Deferred AXIS III:   Past Medical History  Diagnosis Date  . Diabetes mellitus without complication   . Hypertension   . Schizophrenia    AXIS IV:  housing problems, other psychosocial or environmental problems, problems related to social environment and problems with primary support group AXIS V:  41-50 serious symptoms  Plan:  Recommend psychiatric Inpatient admission when medically cleared.  Subjective:   [name] is a 52 y.o. male patient admitted with Suicide ideation, hx Schizophrenia, substance abuse.  HPI:  Pt reports feelings or suicidal and verbalized plan to end life by walking into traffic. Pt reports he has prior hx of SI and is currently being followed by [facility] for med mgt. Pt reports he missed his last apt and that he takes Prolixin injection monthly.  He reports feeling paranoid all the time for 5 years. Pt has been out of meds for one month. Pt denies HI. Pt reports hx of AVH and the voices come and go but are not current during assessment. Pt reports long hx of alcohol and crack cocaine use and that he spends $100 daily worth on crack cocaine.   Pt made fair eye contact, interacted well, disheveled in appearance, Ox3, anxious, speech clear and coherent. He denies si/hi/avh during this interview but admitted to feeling paranoid all the time.  We will restart his home medications today while we wait for bed.    HPI Elements:   Location:  [facility]. Quality:  Severe to the extent of voicing suicide. Severity:  severe. Duration:  chronic Schizophrenia.  Past Psychiatric History: Past Medical History  Diagnosis Date  . Diabetes mellitus without complication   . Hypertension   . Schizophrenia     reports that he has been smoking  Cigarettes.  He has been smoking about 0.00 packs per day. He uses smokeless tobacco. He reports that he uses illicit drugs (Cocaine and Marijuana). He reports that he does not drink alcohol. No family history on file. Family History Substance Abuse: No Family Supports: No Living Arrangements: Other relatives (cousin) Can pt return to current living arrangement?: Yes Abuse/Neglect [facility]) Physical Abuse: Denies Verbal Abuse: Denies Sexual Abuse: Denies Allergies:   Allergies  Allergen Reactions  . Haldol [Haloperidol] Other (See Comments)    Unknown reaction    ACT Assessment Complete:  Yes:    Educational Status    Risk to Self: Risk to self Suicidal Ideation: Yes-Currently Present Suicidal Intent: Yes-Currently Present Is patient at risk for suicide?: Yes Suicidal Plan?: Yes-Currently Present Specify Current Suicidal Plan: traffic Access to Means: Yes Specify Access to Suicidal Means: can walk What has been your use of drugs/alcohol within the last 12 months?: yes Previous Attempts/Gestures: Yes How many times?: 3 Other Self Harm Risks: na Triggers for Past Attempts: Unpredictable Intentional Self Injurious Behavior: None Family Suicide History: No Recent stressful life event(s): Conflict (Comment) Persecutory voices/beliefs?: Yes Depression: Yes Depression Symptoms: Tearfulness;Isolating;Fatigue;Guilt;Loss of interest in usual pleasures;Feeling worthless/self pity;Feeling angry/irritable Substance abuse history and/or treatment for substance abuse?: Yes Suicide prevention information given to non-admitted patients: Not applicable  Risk to Others: Risk to Others Homicidal Ideation: No Thoughts of Harm to Others: No Current Homicidal Intent: No Current Homicidal Plan: No Access to Homicidal Means: No Identified Victim: na History of  harm to others?: No Assessment of Violence: None Noted Violent Behavior Description: cooperative Does patient have access to weapons?:  No Criminal Charges Pending?: No Does patient have a court date: No  Abuse: Abuse/Neglect Assessment (Assessment to be complete while patient is alone) Physical Abuse: Denies Verbal Abuse: Denies Sexual Abuse: Denies Exploitation of patient/patient's resources: Denies Self-Neglect: Denies  Prior Inpatient Therapy: Prior Inpatient Therapy Prior Inpatient Therapy: Yes Prior Therapy Dates: 2013 [facility]Provider[facility] Reason for Treatment: depression  Prior Outpatient Therapy: Prior Outpatient Therapy Prior Outpatient Therapy: Yes Prior Therapy Dates: 2014 Prior Therapy Facilty[facility]Provider[facility] Reason for Treatment: med mgt  Additional Information: Additional Information 1:1 In Past 12 Months?: No CIRT Risk: No Elopement Risk: No Does patient have medical clearance?: Yes   Recent Results (from the past 2160 hour(s))  URINALYSIS, ROUTINE W REFLEX MICROSCOPIC     Status: Abnormal   Collection Time    02/17/13  5:48 AM      Result Value Range   Color, Urine AMBER (*) YELLOW   Comment: BIOCHEMICALS MAY BE AFFECTED BY COLOR   APPearance CLEAR  CLEAR   Specific Gravity, Urine 1.031 (*) 1.005 - 1.030   pH 5.5  5.0 - 8.0   Glucose, UA 100 (*) NEGATIVE mg/dL   Hgb urine dipstick MODERATE (*) NEGATIVE   Bilirubin Urine SMALL (*) NEGATIVE   Ketones, ur NEGATIVE  NEGATIVE mg/dL   Protein, ur >045 (*) NEGATIVE mg/dL   Urobilinogen, UA 1.0  0.0 - 1.0 mg/dL   Nitrite NEGATIVE  NEGATIVE   Leukocytes, UA NEGATIVE  NEGATIVE  URINE MICROSCOPIC-ADD ON     Status: Abnormal   Collection Time    02/17/13  5:48 AM      Result Value Range   Squamous Epithelial / LPF RARE  RARE   WBC, UA 0-2  <3 WBC/hpf   RBC / HPF 0-2  <3 RBC/hpf   Bacteria, UA FEW (*) RARE   Urine-Other MUCOUS PRESENT    CBC WITH DIFFERENTIAL     Status: Abnormal   Collection Time    02/17/13  6:05 AM      Result Value Range   WBC 6.9  4.0 - 10.5 K/uL   RBC 4.45  4.22 - 5.81 MIL/uL   Hemoglobin 12.8  (*) 13.0 - 17.0 g/dL   HCT 40.9  81.1 - 91.4 %   MCV 88.5  78.0 - 100.0 fL   MCH 28.8  26.0 - 34.0 pg   MCHC 32.5  30.0 - 36.0 g/dL   RDW 78.2  95.6 - 21.3 %   Platelets 212  150 - 400 K/uL   Comment: SPECIMEN CHECKED FOR CLOTS     REPEATED TO VERIFY     PLATELET COUNT CONFIRMED BY SMEAR     GIANT PLATELETS SEEN   Neutrophils Relative % 78 (*) 43 - 77 %   Neutro Abs 5.4  1.7 - 7.7 K/uL   Lymphocytes Relative 14  12 - 46 %   Lymphs Abs 1.0  0.7 - 4.0 K/uL   Monocytes Relative 8  3 - 12 %   Monocytes Absolute 0.5  0.1 - 1.0 K/uL   Eosinophils Relative 1  0 - 5 %   Eosinophils Absolute 0.1  0.0 - 0.7 K/uL   Basophils Relative 0  0 - 1 %   Basophils Absolute 0.0  0.0 - 0.1 K/uL  COMPREHENSIVE METABOLIC PANEL     Status: Abnormal   Collection Time  02/17/13  6:05 AM      Result Value Range   Sodium 142  135 - 145 mEq/L   Potassium 3.1 (*) 3.5 - 5.1 mEq/L   Chloride 103  96 - 112 mEq/L   CO2 29  19 - 32 mEq/L   Glucose, Bld 200 (*) 70 - 99 mg/dL   BUN 10  6 - 23 mg/dL   Creatinine, Ser 5.62  0.50 - 1.35 mg/dL   Calcium 9.4  8.4 - 13.0 mg/dL   Total Protein 6.5  6.0 - 8.3 g/dL   Albumin 3.4 (*) 3.5 - 5.2 g/dL   AST 52 (*) 0 - 37 U/L   ALT 32  0 - 53 U/L   Alkaline Phosphatase 95  39 - 117 U/L   Total Bilirubin 0.7  0.3 - 1.2 mg/dL   GFR calc non Af Amer >90  >90 mL/min   GFR calc Af Amer >90  >90 mL/min   Comment: (NOTE)     The eGFR has been calculated using the CKD EPI equation.     This calculation has not been validated in all clinical situations.     eGFR's persistently <90 mL/min signify possible Chronic Kidney     Disease.  TROPONIN I     Status: None   Collection Time    02/17/13  6:05 AM      Result Value Range   Troponin I <0.30  <0.30 ng/mL   Comment:            Due to the release kinetics of cTnI,     a negative result within the first hours     of the onset of symptoms does not rule out     myocardial infarction with certainty.     If myocardial  infarction is still suspected,     repeat the test at appropriate intervals.  KETONES, QUALITATIVE     Status: None   Collection Time    02/17/13  6:05 AM      Result Value Range   Acetone, Bld NEGATIVE  NEGATIVE  CBC WITH DIFFERENTIAL     Status: Abnormal   Collection Time    03/14/13  7:36 AM      Result Value Range   WBC 5.3  4.0 - 10.5 K/uL   RBC 4.53  4.22 - 5.81 MIL/uL   Hemoglobin 12.9 (*) 13.0 - 17.0 g/dL   HCT 86.5  78.4 - 69.6 %   MCV 86.5  78.0 - 100.0 fL   MCH 28.5  26.0 - 34.0 pg   MCHC 32.9  30.0 - 36.0 g/dL   RDW 29.5  28.4 - 13.2 %   Platelets 160  150 - 400 K/uL   Neutrophils Relative % 67  43 - 77 %   Neutro Abs 3.5  1.7 - 7.7 K/uL   Lymphocytes Relative 20  12 - 46 %   Lymphs Abs 1.0  0.7 - 4.0 K/uL   Monocytes Relative 9  3 - 12 %   Monocytes Absolute 0.5  0.1 - 1.0 K/uL   Eosinophils Relative 4  0 - 5 %   Eosinophils Absolute 0.2  0.0 - 0.7 K/uL   Basophils Relative 1  0 - 1 %   Basophils Absolute 0.0  0.0 - 0.1 K/uL  COMPREHENSIVE METABOLIC PANEL     Status: Abnormal   Collection Time    03/14/13  7:36 AM      Result Value Range  Sodium 137  135 - 145 mEq/L   Potassium 3.7  3.5 - 5.1 mEq/L   Chloride 100  96 - 112 mEq/L   CO2 28  19 - 32 mEq/L   Glucose, Bld 349 (*) 70 - 99 mg/dL   BUN 10  6 - 23 mg/dL   Creatinine, Ser 1.61  0.50 - 1.35 mg/dL   Calcium 9.5  8.4 - 09.6 mg/dL   Total Protein 6.8  6.0 - 8.3 g/dL   Albumin 3.5  3.5 - 5.2 g/dL   AST 14  0 - 37 U/L   ALT 10  0 - 53 U/L   Alkaline Phosphatase 87  39 - 117 U/L   Total Bilirubin 0.4  0.3 - 1.2 mg/dL   GFR calc non Af Amer >90  >90 mL/min   GFR calc Af Amer >90  >90 mL/min   Comment: (NOTE)     The eGFR has been calculated using the CKD EPI equation.     This calculation has not been validated in all clinical situations.     eGFR's persistently <90 mL/min signify possible Chronic Kidney     Disease.  ETHANOL     Status: None   Collection Time    03/14/13  7:36 AM      Result  Value Range   Alcohol, Ethyl (B) <11  0 - 11 mg/dL   Comment:            LOWEST DETECTABLE LIMIT FOR     SERUM ALCOHOL IS 11 mg/dL     FOR MEDICAL PURPOSES ONLY  SALICYLATE LEVEL     Status: Abnormal   Collection Time    03/14/13  7:36 AM      Result Value Range   Salicylate Lvl <2.0 (*) 2.8 - 20.0 mg/dL  ACETAMINOPHEN LEVEL     Status: None   Collection Time    03/14/13  7:36 AM      Result Value Range   Acetaminophen (Tylenol), Serum <15.0  10 - 30 ug/mL   Comment:            THERAPEUTIC CONCENTRATIONS VARY     SIGNIFICANTLY. A RANGE OF 10-30     ug/mL MAY BE AN EFFECTIVE     CONCENTRATION FOR MANY PATIENTS.     HOWEVER, SOME ARE BEST TREATED     AT CONCENTRATIONS OUTSIDE THIS     RANGE.     ACETAMINOPHEN CONCENTRATIONS     >150 ug/mL AT 4 HOURS AFTER     INGESTION AND >50 ug/mL AT 12     HOURS AFTER INGESTION ARE     OFTEN ASSOCIATED WITH TOXIC     REACTIONS.  HEMOGLOBIN A1C     Status: Abnormal   Collection Time    03/14/13  7:36 AM      Result Value Range   Hemoglobin A1C 8.4 (*) <5.7 %   Comment: (NOTE)                                                                               According to the ADA Clinical Practice Recommendations for 2011, when     HbA1c is used as a screening  test:      >=6.5%   Diagnostic of Diabetes Mellitus               (if abnormal result is confirmed)     5.7-6.4%   Increased risk of developing Diabetes Mellitus     References:Diagnosis and Classification of Diabetes Mellitus,Diabetes     Care,2011,34(Suppl 1):S62-S69 and Standards of Medical Care in             Diabetes - 2011,Diabetes Care,2011,34 (Suppl 1):S11-S61.   Mean Plasma Glucose 194 (*) <117 mg/dL   Comment: Performed at [facility]  RPR     Status: None   Collection Time    03/14/13  8:55 AM      Result Value Range   RPR NON REACTIVE  NON REACTIVE   Comment: Performed at [facility]  HIV ANTIBODY (ROUTINE TESTING)     Status: None   Collection Time     03/14/13  8:55 AM      Result Value Range   HIV NON REACTIVE  NON REACTIVE   Comment: Performed at [facility]  URINALYSIS, ROUTINE W REFLEX MICROSCOPIC     Status: Abnormal   Collection Time    03/14/13  9:00 AM      Result Value Range   Color, Urine YELLOW  YELLOW   APPearance CLEAR  CLEAR   Specific Gravity, Urine 1.040 (*) 1.005 - 1.030   pH 6.5  5.0 - 8.0   Glucose, UA >1000 (*) NEGATIVE mg/dL   Hgb urine dipstick NEGATIVE  NEGATIVE   Bilirubin Urine NEGATIVE  NEGATIVE   Ketones, ur NEGATIVE  NEGATIVE mg/dL   Protein, ur NEGATIVE  NEGATIVE mg/dL   Urobilinogen, UA 1.0  0.0 - 1.0 mg/dL   Nitrite NEGATIVE  NEGATIVE   Leukocytes, UA NEGATIVE  NEGATIVE  URINE CULTURE     Status: None   Collection Time    03/14/13  9:00 AM      Result Value Range   Specimen Description URINE, RANDOM     Special Requests NONE     Culture  Setup Time       Value: 03/14/2013 21:27     Performed at [facility] Count       Value: NO GROWTH     Performed at [facility]   Culture       Value: NO GROWTH     Performed at [facility]   Report Status 03/15/2013 FINAL    URINE RAPID DRUG SCREEN (HOSP PERFORMED)     Status: Abnormal   Collection Time    03/14/13  9:00 AM      Result Value Range   Opiates NONE DETECTED  NONE DETECTED   Cocaine POSITIVE (*) NONE DETECTED   Benzodiazepines NONE DETECTED  NONE DETECTED   Amphetamines NONE DETECTED  NONE DETECTED   Tetrahydrocannabinol NONE DETECTED  NONE DETECTED   Barbiturates NONE DETECTED  NONE DETECTED   Comment:            DRUG SCREEN FOR MEDICAL PURPOSES     ONLY.  IF CONFIRMATION IS NEEDED     FOR ANY PURPOSE, NOTIFY LAB     WITHIN 5 DAYS.                LOWEST DETECTABLE LIMITS     FOR URINE DRUG SCREEN     Drug Class       Cutoff (ng/mL)  Amphetamine      1000     Barbiturate      200     Benzodiazepine   200     Tricyclics       300     Opiates          300     Cocaine           300     THC              50  GC/CHLAMYDIA PROBE AMP     Status: None   Collection Time    03/14/13  9:00 AM      Result Value Range   CT Probe RNA NEGATIVE  NEGATIVE   GC Probe RNA NEGATIVE  NEGATIVE   Comment: (NOTE)                                                                                              normal Reference Range: Negative          Assay performed using the Gen-Probe APTIMA COMBO2 (R) Assay.     Acceptable specimen types for this assay include APTIMA Swabs (Unisex,     endocervical, urethral, or vaginal), first void urine, and ThinPrep     liquid based cytology samples.     Performed at [facility]  URINE MICROSCOPIC-ADD ON     Status: None   Collection Time    03/14/13  9:00 AM      Result Value Range   WBC, UA 0-2  <3 WBC/hpf   RBC / HPF 0-2  <3 RBC/hpf  GLUCOSE, CAPILLARY     Status: Abnormal   Collection Time    03/14/13  8:46 PM      Result Value Range   Glucose-Capillary 274 (*) 70 - 99 mg/dL   Comment 1 Notify RN    GLUCOSE, CAPILLARY     Status: Abnormal   Collection Time    03/15/13  6:54 AM      Result Value Range   Glucose-Capillary 281 (*) 70 - 99 mg/dL  GLUCOSE, CAPILLARY     Status: Abnormal   Collection Time    03/15/13 12:08 PM      Result Value Range   Glucose-Capillary 137 (*) 70 - 99 mg/dL  GLUCOSE, CAPILLARY     Status: Abnormal   Collection Time    03/15/13  4:37 PM      Result Value Range   Glucose-Capillary 247 (*) 70 - 99 mg/dL   Comment 1 Notify RN    GLUCOSE, CAPILLARY     Status: Abnormal   Collection Time    03/26/13  1:42 AM      Result Value Range   Glucose-Capillary 194 (*) 70 - 99 mg/dL  CBC WITH DIFFERENTIAL     Status: Abnormal   Collection Time    03/26/13  2:24 AM      Result Value Range   WBC 5.9  4.0 - 10.5 K/uL   RBC 4.43  4.22 - 5.81 MIL/uL   Hemoglobin 12.7 (*) 13.0 - 17.0 g/dL  HCT 38.7 (*) 39.0 - 52.0 %   MCV 87.4  78.0 - 100.0 fL   MCH 28.7  26.0 - 34.0 pg   MCHC 32.8  30.0 - 36.0 g/dL    RDW 16.1  09.6 - 04.5 %   Platelets 238  150 - 400 K/uL   Neutrophils Relative % 67  43 - 77 %   Neutro Abs 4.0  1.7 - 7.7 K/uL   Lymphocytes Relative 25  12 - 46 %   Lymphs Abs 1.5  0.7 - 4.0 K/uL   Monocytes Relative 7  3 - 12 %   Monocytes Absolute 0.4  0.1 - 1.0 K/uL   Eosinophils Relative 1  0 - 5 %   Eosinophils Absolute 0.0  0.0 - 0.7 K/uL   Basophils Relative 0  0 - 1 %   Basophils Absolute 0.0  0.0 - 0.1 K/uL  ETHANOL     Status: None   Collection Time    03/26/13  2:24 AM      Result Value Range   Alcohol, Ethyl (B) <11  0 - 11 mg/dL   Comment:            LOWEST DETECTABLE LIMIT FOR     SERUM ALCOHOL IS 11 mg/dL     FOR MEDICAL PURPOSES ONLY  URINE RAPID DRUG SCREEN (HOSP PERFORMED)     Status: Abnormal   Collection Time    03/26/13  2:27 AM      Result Value Range   Opiates NONE DETECTED  NONE DETECTED   Cocaine POSITIVE (*) NONE DETECTED   Benzodiazepines NONE DETECTED  NONE DETECTED   Amphetamines NONE DETECTED  NONE DETECTED   Tetrahydrocannabinol NONE DETECTED  NONE DETECTED   Barbiturates NONE DETECTED  NONE DETECTED   Comment:            DRUG SCREEN FOR MEDICAL PURPOSES     ONLY.  IF CONFIRMATION IS NEEDED     FOR ANY PURPOSE, NOTIFY LAB     WITHIN 5 DAYS.                LOWEST DETECTABLE LIMITS     FOR URINE DRUG SCREEN     Drug Class       Cutoff (ng/mL)     Amphetamine      1000     Barbiturate      200     Benzodiazepine   200     Tricyclics       300     Opiates          300     Cocaine          300     THC              50  POCT I-STAT, CHEM 8     Status: Abnormal   Collection Time    03/26/13  2:38 AM      Result Value Range   Sodium 142  135 - 145 mEq/L   Potassium 4.8  3.5 - 5.1 mEq/L   Chloride 105  96 - 112 mEq/L   BUN 19  6 - 23 mg/dL   Creatinine, Ser 4.09  0.50 - 1.35 mg/dL   Glucose, Bld 811 (*) 70 - 99 mg/dL   Calcium, Ion 9.14  7.82 - 1.23 mmol/L   TCO2 27  0 - 100 mmol/L   Hemoglobin 14.6  13.0 - 17.0 g/dL   HCT 43.0  39.0 - 52.0 %  GLUCOSE, CAPILLARY     Status: Abnormal   Collection Time    03/26/13  8:08 AM      Result Value Range   Glucose-Capillary 197 (*) 70 - 99 mg/dL   Comment 1 Documented in Chart     Comment 2 Notify RN    GLUCOSE, CAPILLARY     Status: Abnormal   Collection Time    03/26/13  8:09 PM      Result Value Range   Glucose-Capillary 256 (*) 70 - 99 mg/dL  GLUCOSE, CAPILLARY     Status: Abnormal   Collection Time    03/27/13  4:52 AM      Result Value Range   Glucose-Capillary 214 (*) 70 - 99 mg/dL   Comment 1 Notify RN    GLUCOSE, CAPILLARY     Status: Abnormal   Collection Time    03/27/13 11:26 AM      Result Value Range   Glucose-Capillary 247 (*) 70 - 99 mg/dL   Comment 1 Notify RN    GLUCOSE, CAPILLARY     Status: Abnormal   Collection Time    03/27/13  5:24 PM      Result Value Range   Glucose-Capillary 211 (*) 70 - 99 mg/dL   Comment 1 Notify RN    GLUCOSE, CAPILLARY     Status: Abnormal   Collection Time    03/27/13  8:38 PM      Result Value Range   Glucose-Capillary 261 (*) 70 - 99 mg/dL   Comment 1 Notify RN    GLUCOSE, CAPILLARY     Status: Abnormal   Collection Time    03/28/13  5:46 AM      Result Value Range   Glucose-Capillary 236 (*) 70 - 99 mg/dL   Comment 1 Notify RN     Comment 2 Documented in Chart    GLUCOSE, CAPILLARY     Status: Abnormal   Collection Time    03/28/13 11:39 AM      Result Value Range   Glucose-Capillary 298 (*) 70 - 99 mg/dL   Comment 1 Notify RN    GLUCOSE, CAPILLARY     Status: Abnormal   Collection Time    03/28/13  5:25 PM      Result Value Range   Glucose-Capillary 256 (*) 70 - 99 mg/dL  GLUCOSE, CAPILLARY     Status: Abnormal   Collection Time    03/28/13  9:07 PM      Result Value Range   Glucose-Capillary 257 (*) 70 - 99 mg/dL   Comment 1 Notify RN    GLUCOSE, CAPILLARY     Status: Abnormal   Collection Time    03/29/13  6:10 AM      Result Value Range   Glucose-Capillary 231 (*) 70 - 99 mg/dL   GLUCOSE, CAPILLARY     Status: Abnormal   Collection Time    03/29/13 11:50 AM      Result Value Range   Glucose-Capillary 314 (*) 70 - 99 mg/dL  GLUCOSE, CAPILLARY     Status: Abnormal   Collection Time    03/29/13  5:02 PM      Result Value Range   Glucose-Capillary 300 (*) 70 - 99 mg/dL   Comment 1 Notify RN    GLUCOSE, CAPILLARY     Status: Abnormal   Collection Time    03/29/13  9:40 PM      Result Value  Range   Glucose-Capillary 380 (*) 70 - 99 mg/dL   Comment 1 Notify RN    GLUCOSE, CAPILLARY     Status: Abnormal   Collection Time    03/30/13  6:23 AM      Result Value Range   Glucose-Capillary 276 (*) 70 - 99 mg/dL  GLUCOSE, CAPILLARY     Status: Abnormal   Collection Time    03/30/13 11:58 AM      Result Value Range   Glucose-Capillary 286 (*) 70 - 99 mg/dL   Comment 1 Notify RN    GLUCOSE, CAPILLARY     Status: Abnormal   Collection Time    04/02/13  3:54 AM      Result Value Range   Glucose-Capillary 231 (*) 70 - 99 mg/dL   Comment 1 Notify RN    GLUCOSE, CAPILLARY     Status: Abnormal   Collection Time    04/05/13  1:20 AM      Result Value Range   Glucose-Capillary 313 (*) 70 - 99 mg/dL  CBC     Status: Abnormal   Collection Time    04/05/13  1:38 AM      Result Value Range   WBC 7.1  4.0 - 10.5 K/uL   RBC 4.06 (*) 4.22 - 5.81 MIL/uL   Hemoglobin 12.0 (*) 13.0 - 17.0 g/dL   HCT 16.1 (*) 09.6 - 04.5 %   MCV 88.4  78.0 - 100.0 fL   MCH 29.6  26.0 - 34.0 pg   MCHC 33.4  30.0 - 36.0 g/dL   RDW 40.9  81.1 - 91.4 %   Platelets 195  150 - 400 K/uL  COMPREHENSIVE METABOLIC PANEL     Status: Abnormal   Collection Time    04/05/13  1:38 AM      Result Value Range   Sodium 137  135 - 145 mEq/L   Potassium 3.6  3.5 - 5.1 mEq/L   Chloride 98  96 - 112 mEq/L   CO2 28  19 - 32 mEq/L   Glucose, Bld 373 (*) 70 - 99 mg/dL   BUN 11  6 - 23 mg/dL   Creatinine, Ser 7.82  0.50 - 1.35 mg/dL   Calcium 9.0  8.4 - 95.6 mg/dL   Total Protein 6.8  6.0 - 8.3 g/dL    Albumin 3.5  3.5 - 5.2 g/dL   AST 18  0 - 37 U/L   ALT 30  0 - 53 U/L   Alkaline Phosphatase 112  39 - 117 U/L   Total Bilirubin 0.4  0.3 - 1.2 mg/dL   GFR calc non Af Amer >90  >90 mL/min   GFR calc Af Amer >90  >90 mL/min   Comment: (NOTE)     The eGFR has been calculated using the CKD EPI equation.     This calculation has not been validated in all clinical situations.     eGFR's persistently <90 mL/min signify possible Chronic Kidney     Disease.  ETHANOL     Status: None   Collection Time    04/05/13  1:38 AM      Result Value Range   Alcohol, Ethyl (B) <11  0 - 11 mg/dL   Comment:            LOWEST DETECTABLE LIMIT FOR     SERUM ALCOHOL IS 11 mg/dL     FOR MEDICAL PURPOSES ONLY  ACETAMINOPHEN LEVEL  Status: None   Collection Time    04/05/13  1:38 AM      Result Value Range   Acetaminophen (Tylenol), Serum <15.0  10 - 30 ug/mL   Comment:            THERAPEUTIC CONCENTRATIONS VARY     SIGNIFICANTLY. A RANGE OF 10-30     ug/mL MAY BE AN EFFECTIVE     CONCENTRATION FOR MANY PATIENTS.     HOWEVER, SOME ARE BEST TREATED     AT CONCENTRATIONS OUTSIDE THIS     RANGE.     ACETAMINOPHEN CONCENTRATIONS     >150 ug/mL AT 4 HOURS AFTER     INGESTION AND >50 ug/mL AT 12     HOURS AFTER INGESTION ARE     OFTEN ASSOCIATED WITH TOXIC     REACTIONS.  SALICYLATE LEVEL     Status: Abnormal   Collection Time    04/05/13  1:38 AM      Result Value Range   Salicylate Lvl <2.0 (*) 2.8 - 20.0 mg/dL  URINE RAPID DRUG SCREEN (HOSP PERFORMED)     Status: Abnormal   Collection Time    04/05/13  4:36 AM      Result Value Range   Opiates NONE DETECTED  NONE DETECTED   Cocaine POSITIVE (*) NONE DETECTED   Benzodiazepines NONE DETECTED  NONE DETECTED   Amphetamines NONE DETECTED  NONE DETECTED   Tetrahydrocannabinol POSITIVE (*) NONE DETECTED   Barbiturates NONE DETECTED  NONE DETECTED   Comment:            DRUG SCREEN FOR MEDICAL PURPOSES     ONLY.  IF CONFIRMATION IS NEEDED      FOR ANY PURPOSE, NOTIFY LAB     WITHIN 5 DAYS.                LOWEST DETECTABLE LIMITS     FOR URINE DRUG SCREEN     Drug Class       Cutoff (ng/mL)     Amphetamine      1000     Barbiturate      200     Benzodiazepine   200     Tricyclics       300     Opiates          300     Cocaine          300     THC              50  GLUCOSE, CAPILLARY     Status: Abnormal   Collection Time    04/05/13  7:36 AM      Result Value Range   Glucose-Capillary 407 (*) 70 - 99 mg/dL  GLUCOSE, CAPILLARY     Status: Abnormal   Collection Time    04/05/13  2:02 PM      Result Value Range   Glucose-Capillary 294 (*) 70 - 99 mg/dL   Comment 1 Documented in Chart     Comment 2 Notify RN                    Objective: Blood pressure 139/76, pulse 69, temperature 98.4 F (36.9 C), temperature source Oral, resp. rate 18, SpO2 100.00%.There is no height or weight on file to calculate BMI.  Labs are reviewed and are pertinent for  Basically unremarkable except for elevated glucose level.  Patient is a known diabetic.  UDS positive for  Cocaine  Current Facility-Administered Medications  Medication Dose Route Frequency Provider Last Rate Last Dose  . acetaminophen (TYLENOL) tablet 650 mg  650 mg Oral Q4H PRN Kristen N Ward, DO   650 mg at 03/14/13 1416  . alum & mag hydroxide-simeth (MAALOX/MYLANTA) 200-200-20 MG/5ML suspension 30 mL  30 mL Oral PRN Kristen N Ward, DO      . cloNIDine (CATAPRES) tablet 0.2 mg  0.2 mg Oral BID Kristen N Ward, DO   0.2 mg at 03/15/13 0935  . ibuprofen (ADVIL,MOTRIN) tablet 600 mg  600 mg Oral Q8H PRN Kristen N Ward, DO   600 mg at 03/15/13 0423  . insulin aspart (novoLOG) injection 0-9 Units  0-9 Units Subcutaneous TID WC Meredeth Ide, MD   5 Units at 03/15/13 [phone]  . LORazepam (ATIVAN) tablet 1 mg  1 mg Oral Q8H PRN Kristen N Ward, DO   1 mg at 03/15/13 1215  . metFORMIN (GLUCOPHAGE) tablet 1,000 mg  1,000 mg Oral BID WC Kristen N Ward, DO   1,000 mg at  03/15/13 0814  . nicotine (NICODERM CQ - dosed in mg/24 hours) patch 21 mg  21 mg Transdermal Daily Kristen N Ward, DO   21 mg at 03/14/13 1319  . ondansetron (ZOFRAN) tablet 4 mg  4 mg Oral Q8H PRN Kristen N Ward, DO      . zolpidem (AMBIEN) tablet 5 mg  5 mg Oral QHS PRN Layla Maw Ward, DO       Current Outpatient Prescriptions  Medication Sig Dispense Refill  . cloNIDine (CATAPRES) 0.1 MG tablet Take 2 tablets (0.2 mg total) by mouth 2 (two) times daily.  60 tablet  0  . fluPHENAZine decanoate (PROLIXIN) 25 MG/ML injection Inject 25 mg into the muscle every 14 (fourteen) days.      . metFORMIN (GLUCOPHAGE) 500 MG tablet Take 1 tablet (500 mg total) by mouth 2 (two) times daily with a meal.  60 tablet  0    Psychiatric Specialty Exam:     Blood pressure 139/76, pulse 69, temperature 98.4 F (36.9 C), temperature source Oral, resp. rate 18, SpO2 100.00%.There is no height or weight on file to calculate BMI.  General Appearance: Casual and Disheveled  Eye Contact::  Poor  Speech:  Clear and Coherent and Normal Rate  Volume:  Normal  Mood:  Anxious, Depressed, Hopeless, Irritable and Worthless  Affect:  Congruent  Thought Process:  Coherent and Intact  Orientation:  Full (Time, Place, and Person)  Thought Content:  Paranoid Ideation  Suicidal Thoughts:  Yes.  without intent/plan  Homicidal Thoughts:  No  Memory:  Immediate;   Fair Recent;   Fair Remote;   Fair  Judgement:  Poor  Insight:  Lacking and Shallow  Psychomotor Activity:  Normal  Concentration:  Fair  Recall:  NA  Akathisia:  NA  Handed:  Right  AIMS (if indicated):   na  Assets:  Desire for Improvement  Sleep:      Treatment Plan Summary:   Consult with and face to face interview with Dr Lolly Mustache We will admit patient to our 400 hall unit for treatment and safety concerns We will administer Prolixin injection 25 mg IM and follow up with 5 mg po bid x 4 day We will seek placement at another hospital with available  bed. Daily contact with patient to assess and evaluate symptoms and progress in treatment Medication management  Earney Navy   PMHNP-BC 03/15/2013 12:47 PM  I  have personally seen the patient and agreed with the findings and involved in the treatment plan. Berniece Andreas, MD

## 2013-03-15 NOTE — ED Notes (Signed)
Pt refusing blood pressure medication

## 2013-03-15 NOTE — Consult Note (Signed)
Reason for consult Diabetes mellitus management  Consulting physician Dr. Nicanor Alcon     HPI: 52 year old male with a history of hypertension, diabetes mellitus, schizophrenia who came to the ED with complaints of penile discharge and dysuria, substance abuse and paranoia. Patient was examined by the ED physician and did not have any penile discharge on exam or other GU signs of infection. Patient was given Rocephin and Zithromax in the ED. Patient is diabetic and takes metformin at home, blood glucose has been elevated and try hospitalists have been consulted for diabetes management. As per patient he [facility], he does not have glucometer and he does not check his sugars at home. Patient takes metformin at home which was prescribed to him by the prison physician. Patient also uses crack cocaine everyday and is being [facility] [place] for [facility]. Patient also takes Catapres 0.2 mg times daily for hypertension. Patient admits to having some blurred vision but no chest or shortness of breath no nausea vomiting or diarrhea. Allergies:   Allergies  Allergen Reactions  . Haldol [Haloperidol] Other (See Comments)    Unknown reaction      Past Medical History  Diagnosis Date  . Diabetes mellitus without complication   . Hypertension   . Schizophrenia     History reviewed. No pertinent past surgical history.  Prior to Admission medications   Medication Sig Start Date End Date Taking? Authorizing Provider  cloNIDine (CATAPRES) 0.1 MG tablet Take 2 tablets (0.2 mg total) by mouth 2 (two) times daily. 02/17/13  Yes Gilda Crease, MD  fluPHENAZine decanoate (PROLIXIN) 25 MG/ML injection Inject 25 mg into the muscle every 14 (fourteen) days.   Yes Historical Provider, MD  metFORMIN (GLUCOPHAGE) 500 MG tablet Take 1 tablet (500 mg total) by mouth 2 (two) times daily with a meal. 02/17/13  Yes Gilda Crease, MD     Social History:  reports that he has been smoking Cigarettes.  He has been smoking about 0.00 packs per day. He uses smokeless tobacco. He reports that he uses illicit drugs (Cocaine and Marijuana). He reports that he does not drink alcohol.    All the positives are listed in BOLD  Review of Systems:  HEENT: Headache, blurred vision, runny nose, sore throat Neck: Hypothyroidism, hyperthyroidism,,lymphadenopathy Chest : Shortness of breath, history of COPD, Asthma Heart : Chest pain, history of coronary arterey disease GI:  Nausea, vomiting, diarrhea, constipation, GERD GU: Dysuria, urgency, frequency of urination, hematuria Neuro: Stroke, seizures, syncope    Physical Exam: Blood pressure 152/98, pulse 87, temperature 98.7 F (37.1 C), temperature source Oral, resp. rate 15, SpO2 100.00%. Constitutional:   Patient is a well-developed and well-nourished male* in no acute distress and cooperative with exam. Head: Normocephalic and atraumatic Mouth: Mucus membranes moist Eyes: PERRL, EOMI, conjunctivae normal Neck: Supple, No Thyromegaly Cardiovascular: RRR, S1 normal, S2 normal Pulmonary/Chest: CTAB, no wheezes, rales, or rhonchi Abdominal: Soft. Non-tender, non-distended, bowel sounds are normal, no masses, organomegaly, or guarding present.  Neurological: A&O x3, Strenght is normal and symmetric bilaterally, cranial nerve II-XII are grossly intact, no focal motor deficit, sensory intact to light touch bilaterally.  Extremities : No Cyanosis, Clubbing or Edema   Labs on Admission:  Results for orders placed during the hospital encounter of 03/14/13 (from the past 48 hour(s))  CBC WITH DIFFERENTIAL     Status: Abnormal   Collection Time    03/14/13  7:36 AM  Result Value Range   WBC 5.3  4.0 - 10.5 K/uL   RBC 4.53  4.22 - 5.81 MIL/uL   Hemoglobin 12.9 (*) 13.0 - 17.0 g/dL   HCT 16.1  09.6 - 04.5 %   MCV 86.5  78.0 - 100.0 fL   MCH 28.5  26.0 - 34.0 pg   MCHC 32.9   30.0 - 36.0 g/dL   RDW 40.9  81.1 - 91.4 %   Platelets 160  150 - 400 K/uL   Neutrophils Relative % 67  43 - 77 %   Neutro Abs 3.5  1.7 - 7.7 K/uL   Lymphocytes Relative 20  12 - 46 %   Lymphs Abs 1.0  0.7 - 4.0 K/uL   Monocytes Relative 9  3 - 12 %   Monocytes Absolute 0.5  0.1 - 1.0 K/uL   Eosinophils Relative 4  0 - 5 %   Eosinophils Absolute 0.2  0.0 - 0.7 K/uL   Basophils Relative 1  0 - 1 %   Basophils Absolute 0.0  0.0 - 0.1 K/uL  COMPREHENSIVE METABOLIC PANEL     Status: Abnormal   Collection Time    03/14/13  7:36 AM      Result Value Range   Sodium 137  135 - 145 mEq/L   Potassium 3.7  3.5 - 5.1 mEq/L   Chloride 100  96 - 112 mEq/L   CO2 28  19 - 32 mEq/L   Glucose, Bld 349 (*) 70 - 99 mg/dL   BUN 10  6 - 23 mg/dL   Creatinine, Ser 7.82  0.50 - 1.35 mg/dL   Calcium 9.5  8.4 - 95.6 mg/dL   Total Protein 6.8  6.0 - 8.3 g/dL   Albumin 3.5  3.5 - 5.2 g/dL   AST 14  0 - 37 U/L   ALT 10  0 - 53 U/L   Alkaline Phosphatase 87  39 - 117 U/L   Total Bilirubin 0.4  0.3 - 1.2 mg/dL   GFR calc non Af Amer >90  >90 mL/min   GFR calc Af Amer >90  >90 mL/min   Comment: (NOTE)     The eGFR has been calculated using the CKD EPI equation.     This calculation has not been validated in all clinical situations.     eGFR's persistently <90 mL/min signify possible Chronic Kidney     Disease.  ETHANOL     Status: None   Collection Time    03/14/13  7:36 AM      Result Value Range   Alcohol, Ethyl (B) <11  0 - 11 mg/dL   Comment:            LOWEST DETECTABLE LIMIT FOR     SERUM ALCOHOL IS 11 mg/dL     FOR MEDICAL PURPOSES ONLY  SALICYLATE LEVEL     Status: Abnormal   Collection Time    03/14/13  7:36 AM      Result Value Range   Salicylate Lvl <2.0 (*) 2.8 - 20.0 mg/dL  ACETAMINOPHEN LEVEL     Status: None   Collection Time    03/14/13  7:36 AM      Result Value Range   Acetaminophen (Tylenol), Serum <15.0  10 - 30 ug/mL   Comment:            THERAPEUTIC CONCENTRATIONS VARY      SIGNIFICANTLY. A RANGE OF 10-30     ug/mL MAY BE  AN EFFECTIVE     CONCENTRATION FOR MANY PATIENTS.     HOWEVER, SOME ARE BEST TREATED     AT CONCENTRATIONS OUTSIDE THIS     RANGE.     ACETAMINOPHEN CONCENTRATIONS     >150 ug/mL AT 4 HOURS AFTER     INGESTION AND >50 ug/mL AT 12     HOURS AFTER INGESTION ARE     OFTEN ASSOCIATED WITH TOXIC     REACTIONS.  RPR     Status: None   Collection Time    03/14/13  8:55 AM      Result Value Range   RPR NON REACTIVE  NON REACTIVE   Comment: Performed at [facility]  HIV ANTIBODY (ROUTINE TESTING)     Status: None   Collection Time    03/14/13  8:55 AM      Result Value Range   HIV NON REACTIVE  NON REACTIVE   Comment: Performed at [facility]  URINALYSIS, ROUTINE W REFLEX MICROSCOPIC     Status: Abnormal   Collection Time    03/14/13  9:00 AM      Result Value Range   Color, Urine YELLOW  YELLOW   APPearance CLEAR  CLEAR   Specific Gravity, Urine 1.040 (*) 1.005 - 1.030   pH 6.5  5.0 - 8.0   Glucose, UA >1000 (*) NEGATIVE mg/dL   Hgb urine dipstick NEGATIVE  NEGATIVE   Bilirubin Urine NEGATIVE  NEGATIVE   Ketones, ur NEGATIVE  NEGATIVE mg/dL   Protein, ur NEGATIVE  NEGATIVE mg/dL   Urobilinogen, UA 1.0  0.0 - 1.0 mg/dL   Nitrite NEGATIVE  NEGATIVE   Leukocytes, UA NEGATIVE  NEGATIVE  URINE RAPID DRUG SCREEN (HOSP PERFORMED)     Status: Abnormal   Collection Time    03/14/13  9:00 AM      Result Value Range   Opiates NONE DETECTED  NONE DETECTED   Cocaine POSITIVE (*) NONE DETECTED   Benzodiazepines NONE DETECTED  NONE DETECTED   Amphetamines NONE DETECTED  NONE DETECTED   Tetrahydrocannabinol NONE DETECTED  NONE DETECTED   Barbiturates NONE DETECTED  NONE DETECTED   Comment:            DRUG SCREEN FOR MEDICAL PURPOSES     ONLY.  IF CONFIRMATION IS NEEDED     FOR ANY PURPOSE, NOTIFY LAB     WITHIN 5 DAYS.                LOWEST DETECTABLE LIMITS     FOR URINE DRUG SCREEN     Drug Class       Cutoff  (ng/mL)     Amphetamine      1000     Barbiturate      200     Benzodiazepine   200     Tricyclics       300     Opiates          300     Cocaine          300     THC              50  GC/CHLAMYDIA PROBE AMP     Status: None   Collection Time    03/14/13  9:00 AM      Result Value Range   CT Probe RNA NEGATIVE  NEGATIVE   GC Probe RNA NEGATIVE  NEGATIVE   Comment: (NOTE)                                                                                              **  Normal Reference Range: Negative*          Assay performed using the Gen-Probe APTIMA COMBO2 (R) Assay.     Acceptable specimen types for this assay include APTIMA Swabs (Unisex,     endocervical, urethral, or vaginal), first void urine, and ThinPrep     liquid based cytology samples.     Performed at [facility]  URINE MICROSCOPIC-ADD ON     Status: None   Collection Time    03/14/13  9:00 AM      Result Value Range   WBC, UA 0-2  <3 WBC/hpf   RBC / HPF 0-2  <3 RBC/hpf  GLUCOSE, CAPILLARY     Status: Abnormal   Collection Time    03/14/13  8:46 PM      Result Value Range   Glucose-Capillary 274 (*) 70 - 99 mg/dL   Comment 1 Notify RN    GLUCOSE, CAPILLARY     Status: Abnormal   Collection Time    03/15/13  6:54 AM      Result Value Range   Glucose-Capillary 281 (*) 70 - 99 mg/dL    Radiological Exams on Admission: No results found.  Assessment/Plan Active Problems:   Diabetes mellitus   Schizophrenia   Drug abuse  Diabetes mellitus Patient glucose is elevated to 374 in the ED. We'll obtain hemoglobin A1c and start him on sliding scale insulin. Will also continue metformin at this time. Once we determine the need for insulin and hemoglobin A1c patient can be started on Lantus. We'll follow the patient He'll also need diabetic teaching. Patient also glucometer at the time of discharge.  Hypertension Continue with clonidine 0.2 mg by mouth twice a day at this time We'll continue to monitor  and adjust medications accordingly  Schizophrenia Patient to be seen by behavioral [facility]  Cocaine abuse Patient admitted to [facility] for detox  Penile discharge/history of STDs All the workup for gonorrhea and Chlamydia are negative Patient also received Rocephin and Zithromax in the ED    Time Spent on consult 65 min  Kathy Wahid S [facility] Pager: [phone] 03/15/2013, 7:39 AM  If 7PM-7AM, please contact night-coverage  [email or web address]  Password [email or web address]

## 2013-03-15 NOTE — ED Notes (Signed)
Pt took a shower and provided clean linens

## 2013-03-15 NOTE — ED Notes (Signed)
Pt ate 100% of his meal .

## 2013-03-15 NOTE — ED Notes (Signed)
Pt becoming upset with charge and nurse, charge explained what nurse had already told pt that he was not allowed to have extra meal trays per psych md and EDP. Pt told he can comply with rules and behave or he could take his belongings and leave. Pt not IVC. Pt requested his belongings. Pt given 2 pt belonging bags from locker #31. Pt being walked to d/c window with security. Pt will be allowed to use phone in lobby.

## 2013-03-15 NOTE — ED Notes (Signed)
Pt becoming upset about food; ate all of his food and then states he wants something else to eat; becoming verbally aggressive and hostile about the food; pt demands to talk to the doctor, Dr. [name] is at bedside; pt arguing with doctor at this time

## 2013-03-15 NOTE — ED Notes (Signed)
Pt given sandwich after lunch, heated with a sprite; pt denies any SI/HI; states he was running crack cocaine and doesn't need anyone to tell him how to control his diet

## 2013-03-15 NOTE — ED Notes (Signed)
Called security at bedside; pt still being verbally aggressive about nursing staff and not giving him food; pt states he wants to leave and he will not stay and be treated this way; offered patient a sandwich, he stated he did not want at "damn" sandwich. Charge nurse at bedside stated patient could either comply with protocol or he could leave against medical advise; Pt escorted out by security with belongings in hand.

## 2013-03-15 NOTE — ED Notes (Signed)
Pt ate 100% of his lunch

## 2013-03-15 NOTE — ED Notes (Signed)
Pt yelling that he "cant eat this food." Then proceeds to eat all of his breakfast. Afterwards he yells out that he wants another tray. I go in to talk to the patient and explain that we cannot order extra trays due to psych ed policy as well that his blood sugar is 281. Pt begins to yell at me, saying that I am a horrible nurse and that he does not want to talk to me any more. He wants the doctor and demands another breakfast tray. He states he does care what his blood sugar is because he is on the streets running crack and never cares about his blood sugar. I again state that since he is in the hospital we have to treat his sugars and care for his health. Pt again states I am a horrible nurse and demands to speak to the doctor. Spoke with MD who will visit patient shortly.

## 2013-03-26 ENCOUNTER — Encounter (HOSPITAL_COMMUNITY): Payer: Self-pay | Admitting: Behavioral Health

## 2013-03-26 ENCOUNTER — Emergency Department (EMERGENCY_DEPARTMENT_HOSPITAL)
Admission: EM | Admit: 2013-03-26 | Discharge: 2013-03-26 | Disposition: A | Discharge status: Other Acute Inpatient Hospital | Payer: Medicare Other | Source: Home / Self Care | Attending: Emergency Medicine | Admitting: Emergency Medicine

## 2013-03-26 ENCOUNTER — Inpatient Hospital Stay (HOSPITAL_COMMUNITY)
Admission: AD | Admit: 2013-03-26 | Discharge: 2013-03-30 | DRG: 885 | Disposition: A | Discharge status: Home / Self Care | Payer: Medicare Other | Source: Intra-hospital | Attending: Psychiatry | Admitting: Psychiatry

## 2013-03-26 ENCOUNTER — Encounter (HOSPITAL_COMMUNITY): Payer: Self-pay | Admitting: Emergency Medicine

## 2013-03-26 DIAGNOSIS — E119 Type 2 diabetes mellitus without complications: Secondary | ICD-10-CM | POA: Diagnosis present

## 2013-03-26 DIAGNOSIS — Z79899 Other long term (current) drug therapy: Secondary | ICD-10-CM

## 2013-03-26 DIAGNOSIS — I10 Essential (primary) hypertension: Secondary | ICD-10-CM | POA: Diagnosis present

## 2013-03-26 DIAGNOSIS — F209 Schizophrenia, unspecified: Principal | ICD-10-CM | POA: Diagnosis present

## 2013-03-26 DIAGNOSIS — F1910 Other psychoactive substance abuse, uncomplicated: Secondary | ICD-10-CM

## 2013-03-26 DIAGNOSIS — F29 Unspecified psychosis not due to a substance or known physiological condition: Secondary | ICD-10-CM

## 2013-03-26 DIAGNOSIS — M792 Neuralgia and neuritis, unspecified: Secondary | ICD-10-CM

## 2013-03-26 LAB — POCT I-STAT, CHEM 8
BUN: 19 mg/dL (ref 6–23)
Chloride: 105 mEq/L (ref 96–112)
Creatinine, Ser: 1.1 mg/dL (ref 0.50–1.35)
Potassium: 4.8 mEq/L (ref 3.5–5.1)
Sodium: 142 mEq/L (ref 135–145)
TCO2: 27 mmol/L (ref 0–100)

## 2013-03-26 LAB — CBC WITH DIFFERENTIAL/PLATELET
Basophils Absolute: 0 10*3/uL (ref 0.0–0.1)
HCT: 38.7 % — ABNORMAL LOW (ref 39.0–52.0)
Lymphocytes Relative: 25 % (ref 12–46)
Lymphs Abs: 1.5 10*3/uL (ref 0.7–4.0)
Monocytes Absolute: 0.4 10*3/uL (ref 0.1–1.0)
Neutro Abs: 4 10*3/uL (ref 1.7–7.7)
RBC: 4.43 MIL/uL (ref 4.22–5.81)
RDW: 13.5 % (ref 11.5–15.5)
WBC: 5.9 10*3/uL (ref 4.0–10.5)

## 2013-03-26 LAB — RAPID URINE DRUG SCREEN, HOSP PERFORMED
Amphetamines: NOT DETECTED
Barbiturates: NOT DETECTED
Benzodiazepines: NOT DETECTED
Cocaine: POSITIVE — AB

## 2013-03-26 LAB — ETHANOL: Alcohol, Ethyl (B): <11 mg/dL (ref 0–11)

## 2013-03-26 LAB — GLUCOSE, CAPILLARY
Glucose-Capillary: 194 mg/dL — ABNORMAL HIGH (ref 70–99)
Glucose-Capillary: 197 mg/dL — ABNORMAL HIGH (ref 70–99)

## 2013-03-26 MED ORDER — ACETAMINOPHEN 325 MG PO TABS
650.0000 mg | ORAL_TABLET | ORAL | Status: DC | PRN
  Administered 2013-03-26: 650 mg via ORAL
  Filled 2013-03-26: qty 2

## 2013-03-26 MED ORDER — NICOTINE 21 MG/24HR TD PT24
21.0000 mg | MEDICATED_PATCH | Freq: Every day | TRANSDERMAL | Status: DC
  Administered 2013-03-26 – 2013-03-30 (×4): 21 mg via TRANSDERMAL
  Filled 2013-03-26 (×8): qty 1

## 2013-03-26 MED ORDER — DIPHENHYDRAMINE HCL 50 MG PO CAPS
50.0000 mg | ORAL_CAPSULE | Freq: Once | ORAL | Status: AC
  Administered 2013-03-26: 50 mg via ORAL
  Filled 2013-03-26: qty 1
  Filled 2013-03-26: qty 2

## 2013-03-26 MED ORDER — METFORMIN HCL 500 MG PO TABS
500.0000 mg | ORAL_TABLET | Freq: Two times a day (BID) | ORAL | Status: DC
  Administered 2013-03-26: 500 mg via ORAL
  Filled 2013-03-26 (×3): qty 1

## 2013-03-26 MED ORDER — METFORMIN HCL 500 MG PO TABS
500.0000 mg | ORAL_TABLET | Freq: Two times a day (BID) | ORAL | Status: DC
  Administered 2013-03-26 – 2013-03-30 (×9): 500 mg via ORAL
  Filled 2013-03-26 (×12): qty 1

## 2013-03-26 MED ORDER — TRAZODONE HCL 50 MG PO TABS
50.0000 mg | ORAL_TABLET | Freq: Every evening | ORAL | Status: DC | PRN
  Administered 2013-03-27 – 2013-03-29 (×2): 50 mg via ORAL
  Filled 2013-03-26 (×4): qty 1
  Filled 2013-03-26: qty 14

## 2013-03-26 MED ORDER — FLUPHENAZINE HCL 5 MG PO TABS
5.0000 mg | ORAL_TABLET | Freq: Every day | ORAL | Status: DC
  Filled 2013-03-26: qty 1

## 2013-03-26 MED ORDER — ZOLPIDEM TARTRATE 5 MG PO TABS: 5.0000 mg | ORAL_TABLET | Freq: Every evening | ORAL | Status: DC | PRN

## 2013-03-26 MED ORDER — MAGNESIUM HYDROXIDE 400 MG/5ML PO SUSP
30.0000 mL | Freq: Every day | ORAL | Status: DC | PRN
  Administered 2013-03-26: 30 mL via ORAL

## 2013-03-26 MED ORDER — NICOTINE 21 MG/24HR TD PT24: 21.0000 mg | MEDICATED_PATCH | Freq: Every day | TRANSDERMAL | Status: DC

## 2013-03-26 MED ORDER — ACETAMINOPHEN 325 MG PO TABS
650.0000 mg | ORAL_TABLET | Freq: Four times a day (QID) | ORAL | Status: DC | PRN
Start: 2013-03-26 — End: 2013-03-30
  Administered 2013-03-26 – 2013-03-30 (×4): 650 mg via ORAL

## 2013-03-26 MED ORDER — CLONIDINE HCL 0.1 MG PO TABS
0.2000 mg | ORAL_TABLET | Freq: Two times a day (BID) | ORAL | Status: DC
  Administered 2013-03-26: 0.2 mg via ORAL
  Filled 2013-03-26: qty 2

## 2013-03-26 MED ORDER — ALUM & MAG HYDROXIDE-SIMETH 200-200-20 MG/5ML PO SUSP: 30.0000 mL | ORAL | Status: DC | PRN

## 2013-03-26 MED ORDER — FLUPHENAZINE HCL 5 MG PO TABS
5.0000 mg | ORAL_TABLET | Freq: Every day | ORAL | Status: DC
  Administered 2013-03-26 – 2013-03-30 (×5): 5 mg via ORAL
  Filled 2013-03-26 (×7): qty 1

## 2013-03-26 MED ORDER — LORAZEPAM 1 MG PO TABS
1.0000 mg | ORAL_TABLET | Freq: Three times a day (TID) | ORAL | Status: DC | PRN
  Administered 2013-03-26: 1 mg via ORAL
  Filled 2013-03-26: qty 1

## 2013-03-26 NOTE — ED Notes (Signed)
Pt belongings placed in locker 26 

## 2013-03-26 NOTE — Progress Notes (Signed)
Patient ID: [name], male   DOB: Jan 11, 1961, 52 y.o.   MRN: [id number] PER STATE REGULATIONS 482.30  THIS CHART WAS REVIEWED FOR MEDICAL NECESSITY WITH RESPECT TO THE PATIENT'S ADMISSION/ DURATION OF STAY.  NEXT REVIEW DATE: 03/29/2013  Willa Rough, RN, BSN CASE MANAGER

## 2013-03-26 NOTE — Tx Team (Signed)
Initial Interdisciplinary Treatment Plan  PATIENT STRENGTHS: (choose at least two) Ability for insight Capable of independent living General fund of knowledge Motivation for treatment/growth  PATIENT STRESSORS: Financial difficulties Substance abuse   PROBLEM LIST: Problem List/Patient Goals Date to be addressed Date deferred Reason deferred Estimated date of resolution  Depression      Anxiety      Substance abuse                                           DISCHARGE CRITERIA:  Ability to meet basic life and health needs Improved stabilization in mood, thinking, and/or behavior Motivation to continue treatment in a less acute level of care  PRELIMINARY DISCHARGE PLAN: Attend aftercare/continuing care group Attend PHP/IOP Attend 12-step recovery group  PATIENT/FAMIILY INVOLVEMENT: This treatment plan has been presented to and reviewed with the patient, [name].  The patient and family have been given the opportunity to ask questions and make suggestions.  Harold Barban E 03/26/2013, 5:21 PM

## 2013-03-26 NOTE — Progress Notes (Signed)
Admission Note  D: Patient appropriate and cooperative with staff. Patient reported that he's been abusing cocaine and ready to stop using drugs. Also, he voiced that he's been depressed since 1986 and been having increased anxiety. His stressors are finances and inconsistent employment.   A: Support and encouragement provided to patient. Oriented patient to the unit and informed the hospital's rules/policies. Initiated Q15 minute checks for safety.  R: Patient receptive. Denies SI/HI/AVH. Patient remains safe.

## 2013-03-26 NOTE — ED Notes (Signed)
Pt chnaged in to blue paper scrubs. Pt and belongings searched and wanded by security

## 2013-03-26 NOTE — ED Notes (Signed)
TTS speaking with pt over webcam.

## 2013-03-26 NOTE — ED Provider Notes (Signed)
CSN: [id number]     Arrival date & time 03/26/13  0132 History   First MD Initiated Contact with Patient 03/26/13 0149     Chief Complaint  Patient presents with  . Medical Clearance   (Consider location/radiation/quality/duration/timing/severity/associated sxs/prior Treatment) HPI Comments: Patient states she's been using crack cocaine for the last several, weeks.  He is past due for his Prolixin injection, which he gets at [facility].  Denies alcohol use.  States she wants help with his mental health issues.  He denies suicidality, homicidality  The history is provided by the patient.    Past Medical History  Diagnosis Date  . Diabetes mellitus without complication   . Hypertension   . Schizophrenia    History reviewed. No pertinent past surgical history. History reviewed. No pertinent family history. History  Substance Use Topics  . Smoking status: Current Every Day Smoker    Types: Cigarettes  . Smokeless tobacco: Current User  . Alcohol Use: Yes    Review of Systems  Respiratory: Negative for shortness of breath.   Neurological: Negative for dizziness and headaches.  Psychiatric/Behavioral: Positive for decreased concentration. Negative for suicidal ideas, self-injury and agitation. The patient is nervous/anxious.   All other systems reviewed and are negative.    Allergies  Haldol  Home Medications   No current outpatient prescriptions on file. BP 159/88  Pulse 87  Temp(Src) 98.5 F (36.9 C) (Oral)  Resp 16  Ht 5\' 9"  (1.753 m)  Wt 200 lb (90.719 kg)  BMI 29.52 kg/m2  SpO2 100% Physical Exam  Nursing note and vitals reviewed. Constitutional: He is oriented to person, place, and time. He appears well-developed and well-nourished.  HENT:  Head: Normocephalic.  Eyes: Pupils are equal, round, and reactive to light.  Neck: Normal range of motion.  Cardiovascular: Normal rate.   Musculoskeletal: Normal range of motion.  Neurological: He is alert and oriented to  person, place, and time.  Skin: Skin is warm and dry.  Psychiatric: His speech is normal. His mood appears anxious. Thought content is paranoid. He expresses impulsivity. He expresses no homicidal and no suicidal ideation. He expresses no suicidal plans and no homicidal plans.    ED Course  Procedures (including critical care time) Labs Review Labs Reviewed  GLUCOSE, CAPILLARY - Abnormal; Notable for the following:    Glucose-Capillary 194 (*)    All other components within normal limits  CBC WITH DIFFERENTIAL - Abnormal; Notable for the following:    Hemoglobin 12.7 (*)    HCT 38.7 (*)    All other components within normal limits  URINE RAPID DRUG SCREEN (HOSP PERFORMED) - Abnormal; Notable for the following:    Cocaine POSITIVE (*)    All other components within normal limits  GLUCOSE, CAPILLARY - Abnormal; Notable for the following:    Glucose-Capillary 197 (*)    All other components within normal limits  POCT I-STAT, CHEM 8 - Abnormal; Notable for the following:    Glucose, Bld 189 (*)    All other components within normal limits  ETHANOL   Imaging Review No results found.  MDM   1. Psychosis         Arman Filter, NP 03/26/13 2045

## 2013-03-26 NOTE — Progress Notes (Signed)
[facility] Group Notes:  (Nursing/MHT/Case Management/Adjunct)  Date:  03/26/2013  Time:  2000  Type of Therapy:  Psychoeducational Skills  Participation Level:  Active  Participation Quality:  Monopolizing  Affect:  Excited  Cognitive:  Disorganized  Insight:  Lacking  Engagement in Group:  Monopolizing  Modes of Intervention:  Education  Summary of Progress/Problems: The patient expressed in group that he had a better day since he isn't walking around on the streets nor is he begging for money. He admits to using crack for many years and would like to stop using. In addition, he shared that he has gone to jail numerous times and is tired of the lifestyle. He is requesting that the nurse check to see when his next Prolixin injection is due. He did not state a goal for tomorrow.   Hazle Coca S 03/26/2013, 11:42 PM

## 2013-03-26 NOTE — Consult Note (Signed)
[facility] Face-to-Face Psychiatry Consult   Reason for Consult:  Schizophrenia, Alcohol dependence and Cocaine Dependence Referring Physician:  EDP MITESH [name] is an 52 y.o. male.  Assessment: AXIS I:  Alcohol Abuse, Substance Abuse and Schizophrenia AXIS II:  Deferred AXIS III:   Past Medical History  Diagnosis Date  . Diabetes mellitus without complication   . Hypertension   . Schizophrenia    AXIS IV:  housing problems, occupational problems, other psychosocial or environmental problems and problems related to social environment AXIS V:  21-30 behavior considerably influenced by delusions or hallucinations OR serious impairment in judgment, communication OR inability to function in almost all areas  Plan:  Recommend psychiatric Inpatient admission when medically cleared.  Subjective:   [name] is a 52 y.o. male patient admitted with Alcohol dependence, Cocaine abuse and Schizophrenia by hx.  HPI:  AA  Male  52 y.o. male that presented to [facility] via [facility] (stated he called them to transport him to ED), stating he was hearing voices, and has been off of his medication (Prolixin) for 3 weeks. Pt stated he missed his last shot and walked out last week from the ER while waiting for admission to our inpatient unit last week.  Pt reports a hx of Schizophrenia. Pt denies visual hallucinations or delusions. Pt endorses sx of depression and anxiety. Pt also admits to longstanding hx of crack cocaine and alcohol use. Pt uses both substances daily, reporting an average of $100 crack per day, last use was this morniig before he arrived to [facility]. Pt reports he drinks one 40 oz beer per day, last use last night. Pt is  calm, cooperative, oriented x 4, has normal speech. Pt asking for help and stated he needs to get back on his meds. Pt stated he lives with his cousin and that is his only support. He is accepted to our 400 hall unit for safety and stabilization.  He is alert, oriented x3 and  denies SI/HI//VH.  HPI Elements:   Location:  [facility]. Quality:  SEVERE WARANTING A SECOND er VISIT IN A WEEK. Severity:  SEVERE.  Past Psychiatric History: Past Medical History  Diagnosis Date  . Diabetes mellitus without complication   . Hypertension   . Schizophrenia     reports that he has been smoking Cigarettes.  He has been smoking about 0.00 packs per day. He uses smokeless tobacco. He reports that  drinks alcohol. He reports that he uses illicit drugs (Cocaine). History reviewed. No pertinent family history. Family History Substance Abuse: No Family Supports: Yes, List: (cousin) Living Arrangements: Other relatives Can pt return to current living arrangement?: Yes Abuse/Neglect [facility]) Physical Abuse: Denies Verbal Abuse: Denies Sexual Abuse: Denies Allergies:   Allergies  Allergen Reactions  . Haldol [Haloperidol] Other (See Comments)    Unknown reaction    ACT Assessment Complete:  No:   Past Psychiatric History: Diagnosis:  SCHIZOPHRENIA, Cocaine abuse, Alcohol dep  Hospitalizations:  yes  Outpatient Care:  Yes, [name]  Substance Abuse Care:  Yes [facility]  Self-Mutilation:  denies  Suicidal Attempts:  denies  Homicidal Behaviors:  denies   Violent Behaviors:  denies   Place of Residence:  [place] Marital Status:  Divorced Employed/Unemployed:  unemployed Education:  unknown Family Supports:  Yes, cousin Objective: Blood pressure 159/88, pulse 87, temperature 98.5 F (36.9 C), temperature source Oral, resp. rate 16, height 5\' 9"  (1.753 m), weight 90.719 kg (200 lb), SpO2 100.00%.Body mass index is 29.52 kg/(m^2). Results for orders  placed during the hospital encounter of 03/26/13 (from the past 72 hour(s))  GLUCOSE, CAPILLARY     Status: Abnormal   Collection Time    03/26/13  1:42 AM      Result Value Range   Glucose-Capillary 194 (*) 70 - 99 mg/dL  CBC WITH DIFFERENTIAL     Status: Abnormal   Collection Time    03/26/13  2:24 AM      Result Value Range    WBC 5.9  4.0 - 10.5 K/uL   RBC 4.43  4.22 - 5.81 MIL/uL   Hemoglobin 12.7 (*) 13.0 - 17.0 g/dL   HCT 29.5 (*) 62.1 - 30.8 %   MCV 87.4  78.0 - 100.0 fL   MCH 28.7  26.0 - 34.0 pg   MCHC 32.8  30.0 - 36.0 g/dL   RDW 65.7  84.6 - 96.2 %   Platelets 238  150 - 400 K/uL   Neutrophils Relative % 67  43 - 77 %   Neutro Abs 4.0  1.7 - 7.7 K/uL   Lymphocytes Relative 25  12 - 46 %   Lymphs Abs 1.5  0.7 - 4.0 K/uL   Monocytes Relative 7  3 - 12 %   Monocytes Absolute 0.4  0.1 - 1.0 K/uL   Eosinophils Relative 1  0 - 5 %   Eosinophils Absolute 0.0  0.0 - 0.7 K/uL   Basophils Relative 0  0 - 1 %   Basophils Absolute 0.0  0.0 - 0.1 K/uL  ETHANOL     Status: None   Collection Time    03/26/13  2:24 AM      Result Value Range   Alcohol, Ethyl (B) <11  0 - 11 mg/dL   Comment:            LOWEST DETECTABLE LIMIT FOR     SERUM ALCOHOL IS 11 mg/dL     FOR MEDICAL PURPOSES ONLY  URINE RAPID DRUG SCREEN (HOSP PERFORMED)     Status: Abnormal   Collection Time    03/26/13  2:27 AM      Result Value Range   Opiates NONE DETECTED  NONE DETECTED   Cocaine POSITIVE (*) NONE DETECTED   Benzodiazepines NONE DETECTED  NONE DETECTED   Amphetamines NONE DETECTED  NONE DETECTED   Tetrahydrocannabinol NONE DETECTED  NONE DETECTED   Barbiturates NONE DETECTED  NONE DETECTED   Comment:            DRUG SCREEN FOR MEDICAL PURPOSES     ONLY.  IF CONFIRMATION IS NEEDED     FOR ANY PURPOSE, NOTIFY LAB     WITHIN 5 DAYS.                LOWEST DETECTABLE LIMITS     FOR URINE DRUG SCREEN     Drug Class       Cutoff (ng/mL)     Amphetamine      1000     Barbiturate      200     Benzodiazepine   200     Tricyclics       300     Opiates          300     Cocaine          300     THC              50  POCT I-STAT, CHEM 8     Status: Abnormal  Collection Time    03/26/13  2:38 AM      Result Value Range   Sodium 142  135 - 145 mEq/L   Potassium 4.8  3.5 - 5.1 mEq/L   Chloride 105  96 - 112 mEq/L   BUN  19  6 - 23 mg/dL   Creatinine, Ser 1.61  0.50 - 1.35 mg/dL   Glucose, Bld 096 (*) 70 - 99 mg/dL   Calcium, Ion 0.45  4.09 - 1.23 mmol/L   TCO2 27  0 - 100 mmol/L   Hemoglobin 14.6  13.0 - 17.0 g/dL   HCT 81.1  91.4 - 78.2 %  GLUCOSE, CAPILLARY     Status: Abnormal   Collection Time    03/26/13  8:08 AM      Result Value Range   Glucose-Capillary 197 (*) 70 - 99 mg/dL   Comment 1 Documented in Chart     Comment 2 Notify RN     Labs are reviewed and are pertinent for Unremarkable, UDS Positive cocaine  Current Facility-Administered Medications  Medication Dose Route Frequency Provider Last Rate Last Dose  . acetaminophen (TYLENOL) tablet 650 mg  650 mg Oral Q4H PRN [name], PA-C   650 mg at 03/26/13 0734  . cloNIDine (CATAPRES) tablet 0.2 mg  0.2 mg Oral BID Hurman Horn, MD   0.2 mg at 03/26/13 1009  . fluPHENAZine (PROLIXIN) tablet 5 mg  5 mg Oral Daily Verne Spurr, PA-C      . LORazepam (ATIVAN) tablet 1 mg  1 mg Oral Q8H PRN Arman Filter, NP   1 mg at 03/26/13 1010  . metFORMIN (GLUCOPHAGE) tablet 500 mg  500 mg Oral BID WC Hurman Horn, MD   500 mg at 03/26/13 1010  . nicotine (NICODERM CQ - dosed in mg/24 hours) patch 21 mg  21 mg Transdermal Daily Arman Filter, NP      . zolpidem (AMBIEN) tablet 5 mg  5 mg Oral QHS PRN Arman Filter, NP       Current Outpatient Prescriptions  Medication Sig Dispense Refill  . cloNIDine (CATAPRES) 0.1 MG tablet Take 2 tablets (0.2 mg total) by mouth 2 (two) times daily.  60 tablet  0  . fluPHENAZine decanoate (PROLIXIN) 25 MG/ML injection Inject 25 mg into the muscle every 14 (fourteen) days.      . metFORMIN (GLUCOPHAGE) 500 MG tablet Take 1 tablet (500 mg total) by mouth 2 (two) times daily with a meal.  60 tablet  0    Psychiatric Specialty Exam:     Blood pressure 159/88, pulse 87, temperature 98.5 F (36.9 C), temperature source Oral, resp. rate 16, height 5\' 9"  (1.753 m), weight 90.719 kg (200 lb), SpO2 100.00%.Body mass  index is 29.52 kg/(m^2).  General Appearance: Casual and Fairly Groomed  Patent attorney::  Good  Speech:  Clear and Coherent and Normal Rate  Volume:  Normal  Mood:  Euthymic  Affect:  Congruent  Thought Process:  Coherent and Goal Directed  Orientation:  Full (Time, Place, and Person)  Thought Content:  Hallucinations: Auditory Command:  kill himself  Suicidal Thoughts:  No  Homicidal Thoughts:  No  Memory:  Immediate;   Good Recent;   Good Remote;   Good  Judgement:  Poor  Insight:  Lacking and Shallow  Psychomotor Activity:  Normal  Concentration:  Fair  Recall:  NA  Akathisia:  NA  Handed:  Right  AIMS (if indicated):  Assets:  Desire for Improvement Housing  Sleep:      Treatment Plan Summary:  Consult and face to face interview with Dr Ladona Ridgel Patient is accepted at our 400 hall unit for treatment and stabilization We will resume his Prolixin po/im while in our unit. Daily contact with patient to assess and evaluate symptoms and progress in treatment Medication management  Earney Navy  PMHNP-BC 03/26/2013 2:11 PM

## 2013-03-26 NOTE — BH Assessment (Signed)
[facility] Assessment Progress Note Update:  Pt accepted by Shelda Jakes, PA-C to [facility] to Dr. Elsie Saas to bed 504-1.  Called pt's nurse, Fleet Contras, to inform her @ 1110 and she is to inform EDP Bednar and arrange transport for pt to [facility] once support paperwork completed.

## 2013-03-26 NOTE — BH Assessment (Addendum)
Assessment Note  [name] is an 52 y.o. male that presented to [facility] via [facility] (stated he called them to transport him to ED), stating he was hearing voices, and has been off of his medication (Prolixin) for 3 weeks.  Pt stated he missed his last shot.  Pt reports a hx of Schizophrenia.  Pt denies visual hallucinations or delusions.  Pt endorses sx of depression and anxiety.  Pt also admits to longstanding hx of crack cocaine and alcohol use.  Pt uses both substances daily, reporting an average of $100 crack per day, last use was this morniig before he arrived to [facility]  Pt reports he drinks one 40 oz beer per day, last use last night.  Pt calm, cooperative, oriented x 4, has normal speech.  Pt presented to [facility] recently for similar sx, but reported he left because he was mad at the nurses.  Pt asking for help and stated he needs to get back on his meds.  Pt stated he lives with his cousin and that is his only support.  Completed tele assessment and will run pt for inpatient treatment, as pt is endorsing sx of psychosis and has decompensated.  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 Diabetes mellitus without complication   . Hypertension   . Schizophrenia    Axis IV: economic problems, housing problems, other psychosocial or environmental problems, problems related to social environment and problems with primary support group Axis V: 21-30 behavior considerably influenced by delusions or hallucinations OR serious impairment in judgment, communication OR inability to function in almost all areas  Past Medical History:  Past Medical History  Diagnosis Date  . Diabetes mellitus without complication   . Hypertension   . Schizophrenia     History reviewed. No pertinent past surgical history.  Family History: History reviewed. No pertinent family history.  Social  History:  reports that he has been smoking Cigarettes.  He has been smoking about 0.00 packs per day. He uses smokeless tobacco. He reports that  drinks alcohol. He reports that he uses illicit drugs (Cocaine).  Additional Social History:  Alcohol / Drug Use Pain Medications: see MAR Prescriptions: see MAR Over the Counter: see MAR History of alcohol / drug use?: Yes Longest period of sobriety (when/how long): unknown Negative Consequences of Use: Financial Withdrawal Symptoms:  (pt denies) Substance #1 Name of Substance 1: alcohol 1 - Age of First Use: teens 1 - Frequency: 40 oz beer 1 - Duration: years 1 - Last Use / Amount: 03/25/13 - 1 40 oz beer Substance #2 Name of Substance 2: crack cocaine 2 - Age of First Use: 20s 2 - Amount (size/oz): varies - 100.00 per day or more 2 - Frequency: daily 2 - Duration: years 2 - Last Use / Amount: 03/26/13 - $100  CIWA: CIWA-Ar BP: 148/86 mmHg Pulse Rate: 103 COWS:    Allergies:  Allergies  Allergen Reactions  . Haldol [Haloperidol] Other (See Comments)    Unknown reaction    Home Medications:  (Not in a hospital admission)  OB/GYN Status:  No LMP for male patient.  General Assessment Data Location of Assessment: [facility] Is this a Tele or Face-to-Face Assessment?: Tele Assessment Is this an Initial Assessment or a Re-assessment for this encounter?: Initial Assessment Living Arrangements: Other relatives Can pt return to current living arrangement?: Yes Admission Status: Voluntary Is patient  capable of signing voluntary admission?: Yes Transfer from: Acute Hospital Referral Source: Self/Family/Friend     [facility] Crisis Care Plan Living Arrangements: Other relatives Name of Psychiatrist: [name] Name of Therapist: none  Education Status Is patient currently in school?: No  Risk to self Suicidal Ideation: No Suicidal Intent: No Is patient at risk for suicide?: No Suicidal Plan?: No Specify Current Suicidal Plan: pt  denies Access to Means: No Specify Access to Suicidal Means: pt denies What has been your use of drugs/alcohol within the last 12 months?: pt admits to daily crack and alcohol use Previous Attempts/Gestures: Yes How many times?: 3 Other Self Harm Risks: pt denies Triggers for Past Attempts: Unpredictable Intentional Self Injurious Behavior: None Family Suicide History: No Recent stressful life event(s): Other (Comment) (off of meds, SA) Persecutory voices/beliefs?: Yes Depression: Yes Depression Symptoms: Despondent;Insomnia;Isolating;Loss of interest in usual pleasures;Feeling worthless/self pity Substance abuse history and/or treatment for substance abuse?: Yes Suicide prevention information given to non-admitted patients: Not applicable  Risk to Others Homicidal Ideation: No Thoughts of Harm to Others: No Current Homicidal Intent: No Current Homicidal Plan: No Access to Homicidal Means: No Identified Victim: pt denies History of harm to others?: No Assessment of Violence: None Noted Violent Behavior Description: na - pt calm, cooperative Does patient have access to weapons?: No Criminal Charges Pending?: No Does patient have a court date: No  Psychosis Hallucinations: Auditory (reports hearing voices) Delusions: None noted  Mental Status Report Appear/Hygiene: Disheveled Eye Contact: Good Motor Activity: Freedom of movement;Unremarkable Speech: Logical/coherent Level of Consciousness: Alert Mood: Depressed;Anxious Affect: Depressed;Anxious Anxiety Level: Moderate Thought Processes: Coherent;Relevant Judgement: Impaired Orientation: Person;Place;Time;Situation Obsessive Compulsive Thoughts/Behaviors: None  Cognitive Functioning Concentration: Decreased Memory: Recent Intact;Remote Intact IQ: Average Insight: Poor Impulse Control: Poor Appetite: Poor Weight Loss: 20 (in 80 days by report) Weight Gain: 0 Sleep: Decreased Total Hours of Sleep:  (reprots not  sleeping because using drugs) Vegetative Symptoms: Decreased grooming  ADLScreening [facility] Assessment Services) Patient's cognitive ability adequate to safely complete daily activities?: Yes Patient able to express need for assistance with ADLs?: Yes Independently performs ADLs?: Yes (appropriate for developmental age)  Prior Inpatient Therapy Prior Inpatient Therapy: Yes Prior Therapy Dates: 2013 and numerous previous dates since the 1980's Prior Therapy Facilty/Provider(s): [facility], [place] [facility], [facility], [facility], [facility], several unknown others Reason for Treatment: Psychosis/SI/SA  Prior Outpatient Therapy Prior Outpatient Therapy: Yes Prior Therapy Dates: Current Prior Therapy Facilty/Provider(s): Transport planner Reason for Treatment: Med mgnt  ADL Screening (condition at time of admission) Patient's cognitive ability adequate to safely complete daily activities?: Yes Is the patient deaf or have difficulty hearing?: No Does the patient have difficulty seeing, even when wearing glasses/contacts?: No Does the patient have difficulty concentrating, remembering, or making decisions?: No Patient able to express need for assistance with ADLs?: Yes Does the patient have difficulty dressing or bathing?: No Independently performs ADLs?: Yes (appropriate for developmental age) Does the patient have difficulty walking or climbing stairs?: No Weakness of Legs: None Weakness of Arms/Hands: None  Home Assistive Devices/Equipment Home Assistive Devices/Equipment: None    Abuse/Neglect Assessment (Assessment to be complete while patient is alone) Physical Abuse: Denies Verbal Abuse: Denies Sexual Abuse: Denies Exploitation of patient/patient's resources: Denies Self-Neglect: Denies Values / Beliefs Cultural Requests During Hospitalization: None Spiritual Requests During Hospitalization: None Consults Spiritual Care Consult Needed: No Social Work Consult Needed: No Merchant navy officer (For  Healthcare) Advance Directive: Patient does not have advance directive;Patient would not like information    Additional Information 1:1  In Past 12 Months?: No CIRT Risk: No Elopement Risk: No Does patient have medical clearance?: Yes     Disposition:  Disposition Initial Assessment Completed for this Encounter: Yes Disposition of Patient: Referred to;Inpatient treatment program Type of inpatient treatment program: Adult Patient referred to: Other (Comment) (Pending [facility])  On Site Evaluation by:   Reviewed with Physician:  [name], PA - Updated at 0920 on pt's pending disposition  Caryl Comes 03/26/2013 8:16 AM

## 2013-03-26 NOTE — BH Assessment (Signed)
[facility] Assessment Progress Note Called [facility] and scheduled pt's telepsych appt for 0745.  [facility] secretary to inform pt's nurse.

## 2013-03-26 NOTE — ED Provider Notes (Signed)
Patient care assumed from Taylor Favor, FNP at shift change.  Patient with hx of schizophrenia. Medically cleared and awaiting TTS eval.  9:20 - [name], counselor with behavioral health, recommends inpatient for psychosis. Pending evaluation by psychiatric provider.  1:00 - Patient accepted by [id number]. EMTALA completed for transfer.  [name], PA-C 03/26/13 1325

## 2013-03-26 NOTE — ED Provider Notes (Signed)
Accepted at [facility], patient agrees to transfer   Taylor Horn, MD 03/27/13 [phone]

## 2013-03-26 NOTE — ED Notes (Signed)
Pt brought here by [employer]  Pt is here voluntarily  Pt states he is hearing voices and that he takes a prolixin shots at [facility] once a month and is overdue  Pt states he has been using crack last used 1 hr ago  Pt states he was here a couple of weeks ago for same but left because he got mad at one of the nurses

## 2013-03-27 DIAGNOSIS — F1020 Alcohol dependence, uncomplicated: Secondary | ICD-10-CM

## 2013-03-27 DIAGNOSIS — F1410 Cocaine abuse, uncomplicated: Secondary | ICD-10-CM

## 2013-03-27 LAB — GLUCOSE, CAPILLARY
Glucose-Capillary: 214 mg/dL — ABNORMAL HIGH (ref 70–99)
Glucose-Capillary: 247 mg/dL — ABNORMAL HIGH (ref 70–99)

## 2013-03-27 NOTE — [facility] Suicide Risk Assessment (Signed)
Suicide Risk Assessment  Admission Assessment     Nursing information obtained from:   EMR Demographic factors:   Homeless, Unemployed Current Mental Status:    General Appearance: Casual  Eye Contact:: Good  Speech: Clear and Coherent, Normal Rate and Pressured  Volume: Normal  Mood: "very well"  Affect: Congruent  Thought Process: Coherent, Linear and Logical  Orientation: Full (Time, Place, and Person)  Thought Content: WDL  Suicidal Thoughts:Can contract for safety on the unit.  Homicidal Thoughts: No Memory: Immediate; Good  Recent; Good  Remote; Good  Judgement: Poor  Insight: Shallow  Psychomotor Activity: Normal  Concentration: Fair  Recall: Poor  Akathisia: No  Handed: Right  AIMS (if indicated): Not indicated  Assets: Resilience  Sleep: Number of Hours: 4.25   Loss Factors:   Loss of social support Historical Factors:   Substance abuse Risk Reduction Factors:   None  CLINICAL FACTORS:   Alcohol/Substance Abuse/Dependencies Schizophrenia:   Depressive state  COGNITIVE FEATURES THAT CONTRIBUTE TO RISK:  Closed-mindedness Polarized thinking Thought constriction (tunnel vision)    SUICIDE RISK:   Mild:  Suicidal ideation of limited frequency, intensity, duration, and specificity.  There are no identifiable plans, no associated intent, mild dysphoria and related symptoms, good self-control (both objective and subjective assessment), few other risk factors, and identifiable protective factors, including available and accessible social support.  PLAN OF CARE:  Observation Level/Precautions: 15 minute checks   Laboratory: None   Psychotherapy: Patient to go for AA/NA groups   Medications: Continue Prolixin 5 mg, Discontinue Trazodone   Consultations: None   Discharge Concerns:   Estimated LOS: 5-7   Other: May require Prolixin shot prior to discharge, need to clarify if the patient has received prolixin while he was in jail.     I certify that inpatient  services furnished can reasonably be expected to improve the patient's condition.  Lanee Chain 03/27/2013, 12:38 PM

## 2013-03-27 NOTE — Progress Notes (Signed)
Patient ID: [name], male   DOB: 1961/06/10, 52 y.o.   MRN: [id number] D: Patient in dayroom on approach. Pt mood/affect appeared anxious. Pt stated he "feels anxious". Pt stated he spent 7 years in jail and wants to get clean and have a health lifestyle. Pt stated he tired of using drugs. Pt denies SI/HI/AVH and pain. Pt attended evening wrap up group and engaged in discussion. Pt denies any needs or concerns.  Cooperative with assessment. No acute distressed noted at this time.   A: Met with pt 1:1. Medications administered as prescribed. Writer encouraged pt to discuss feelings. Pt encouraged to come to staff with any question or concerns.   R: Patient remains safe. He is complaint with medications and denies any adverse reaction. Continue current POC.
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
## 2013-03-27 NOTE — [facility] Group Notes (Signed)
[facility] Group Notes:  (Nursing/MHT/Case Management/Adjunct)  Date:  03/27/2013  Time:  10:41 AM Type of Therapy: Psychoeducational Skills  Participation Level: Did Not Attend  Participation Quality: na  Affect: na  Cognitive: na  Insight: None  Engagement in Group: na  Modes of Intervention: na  Summary of Progress/Problems:   Malva Limes 03/27/2013, 10:41 AM

## 2013-03-27 NOTE — B[facility] Group Notes (Signed)
[facility] Group Notes:  (Clinical Social Work)  03/27/2013   3:00-4:00PM  Summary of Progress/Problems:   The main focus of today's process group was for the patient to identify ways in which they have sabotaged their own mental health wellness/recovery.  Motivational interviewing was used to explore the reasons they engage in this behavior, and reasons they may have for wanting to change.  The Stages of Change were explained to the group using a handout.  The patient was pulled out of group to see the doctor, was not there to discuss how he self sabotages.  He did state he has been using crack cocaine since 1986, and that he was diagnosed with schizophrenia based on using drugs, not from an inherent chemical imbalance.  He wants to be the [employer], respectable, that he used to be.  He has 4 grown children and he does not know how many grandchildren he has, but he loves them and wants to see them.  His brother died of an overdose in [place], and he is fearful that he will die also if he does not stop using.  Other group members asked him what he is going to do when he is released from the hospital and his check comes the next day, and he was unable to identify any ways in which he would be willing to protect himself from self-sabotaging with that money other than using willpower.  He stated has been in prison the last 4 years.  Type of Therapy:  Process Group  Participation Level:  Active  Participation Quality:  Attentive, Monopolizing and Redirectable  Affect:  Blunted  Cognitive:  Disorganized  Insight:  Limited  Engagement in Therapy:  Engaged  Modes of Intervention:  Education, Motivational Interviewing   Ambrose Mantle, LCSW 03/27/2013, 4:35 PM

## 2013-03-27 NOTE — Progress Notes (Signed)
Psychoeducational Group Note  Date:  03/27/2013 Time:  2000  Group Topic/Focus:  Wrap-Up Group:   The focus of this group is to help patients review their daily goal of treatment and discuss progress on daily workbooks.  Participation Level: Did Not Attend  Participation Quality:  Not Applicable  Affect:  Not Applicable  Cognitive:  Not Applicable  Insight:  Not Applicable  Engagement in Group: Not Applicable  Additional Comments:  The patient did not attend group this evening since he was asleep in his bed.   Hazle Coca S 03/27/2013, 9:36 PM
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
## 2013-03-27 NOTE — Progress Notes (Signed)
Patient ID: [name], male   DOB: 24-Apr-1961, 52 y.o.   MRN: [id number] D. Pt presents with depressed, irritable mood and flat affect. Patient has been isolative to room throughout most of shift, with minimal interaction with peers and staff. Patient in am states '' I'm ok , just didn't sleep good'' He denies any further acute concerns at this time. Pt declined to complete self inventory review at this time. Pt compliant with medications. In no acute distress at this time. Will continue to monitor q 15 minutes for safety.

## 2013-03-27 NOTE — Progress Notes (Signed)
Patient ID: [name], male   DOB: May 07, 1961, 52 y.o.   MRN: [id number]  D: Writer introduced herself to the pt and asked pt to describe his day."Good". "Ok, so what did the Dr say?" "Nothing, said nothing can be done about my sugar".  Immediately afterwards pt informed the writer that he was hungry. Writer informed pt, that he had crumbs around his mouth suggesting that he'd already eaten snacks. Encouraged pt to try and control what he eats.  A:  Support and encouragement was offered. 15 min checks continued for safety.  R: Pt remains safe.

## 2013-03-27 NOTE — ED Provider Notes (Signed)
Medical screening examination/treatment/procedure(s) were conducted as a shared visit with non-physician practitioner(s) and myself.  I personally evaluated the patient during the encounter  Taylor Horn, MD 03/27/13 [phone]

## 2013-03-28 DIAGNOSIS — IMO0002 Reserved for concepts with insufficient information to code with codable children: Secondary | ICD-10-CM

## 2013-03-28 DIAGNOSIS — F209 Schizophrenia, unspecified: Principal | ICD-10-CM

## 2013-03-28 DIAGNOSIS — I10 Essential (primary) hypertension: Secondary | ICD-10-CM | POA: Diagnosis present

## 2013-03-28 LAB — GLUCOSE, CAPILLARY
Glucose-Capillary: 236 mg/dL — ABNORMAL HIGH (ref 70–99)
Glucose-Capillary: 298 mg/dL — ABNORMAL HIGH (ref 70–99)
Glucose-Capillary: 256 mg/dL — ABNORMAL HIGH (ref 70–99)

## 2013-03-28 MED ORDER — GABAPENTIN 100 MG PO CAPS
100.0000 mg | ORAL_CAPSULE | Freq: Two times a day (BID) | ORAL | Status: DC
  Administered 2013-03-28 – 2013-03-30 (×4): 100 mg via ORAL
  Filled 2013-03-28 (×6): qty 1

## 2013-03-28 MED ORDER — CLONIDINE HCL 0.1 MG PO TABS
0.1000 mg | ORAL_TABLET | Freq: Two times a day (BID) | ORAL | Status: DC
  Administered 2013-03-28 – 2013-03-30 (×4): 0.1 mg via ORAL
  Filled 2013-03-28 (×6): qty 1

## 2013-03-28 MED ORDER — INSULIN ASPART 100 UNIT/ML ~~LOC~~ SOLN
0.0000 [IU] | Freq: Three times a day (TID) | SUBCUTANEOUS | Status: DC
  Administered 2013-03-28: 5 [IU] via SUBCUTANEOUS
  Administered 2013-03-29: 7 [IU] via SUBCUTANEOUS
  Administered 2013-03-29: 3 [IU] via SUBCUTANEOUS

## 2013-03-28 MED ORDER — LISINOPRIL 10 MG PO TABS
10.0000 mg | ORAL_TABLET | Freq: Every day | ORAL | Status: DC
  Administered 2013-03-28 – 2013-03-29 (×2): 10 mg via ORAL
  Filled 2013-03-28 (×4): qty 1

## 2013-03-28 NOTE — Progress Notes (Signed)
Pt has had very few needs this evening. On 1:1 he denies complaints. No pain or physical problems though forwards little. Flat in affect. BP is slightly decreased since antihypertensives were initiated earlier today. CBG is 257. No hs coverage. Offered trazadone but pt decline. Support and encouragement given. Denies SI/HI/AVH and remains safe. Lawrence Marseilles

## 2013-03-28 NOTE — Progress Notes (Addendum)
[facility] MD Progress Note  03/28/2013 6:41 PM [name]  MRN:  [id number] Subjective: [name] reports he has been doing better today. He was seen by internal medicine for DM and hypertension. He reports that he was able to take part in groups. He reports he is taking his medications and denies any side effects.   Diagnosis:   DSM5: Schizophrenia Disorders:  Schizophrenia (295.7)   AXIS I:  Schizophrenia Disorders: Schizophrenia (295.7)  Substance/Addictive Disorders: Alcohol Related Disorder - Moderate (303.90) Cocaine use disorder  AXIS II: No diagnosis  AXIS III:  Past Medical History   Diagnosis  Date   .  Diabetes mellitus without complication    .  Hypertension    .  Schizophrenia     AXIS IV: other psychosocial or environmental problems  AXIS V: 51-60 moderate symptoms   ADL's:  Intact  Sleep: Good  Appetite:  Good  Suicidal Ideation:  Patient denies. Homicidal Ideation:  Patient denies. AEB (as evidenced by):  Psychiatric Specialty Exam: ROS  Blood pressure 178/100, pulse 87, temperature 98 F (36.7 C), temperature source Oral, resp. rate 18, height 5\' 8"  (1.727 m), weight 89.812 kg (198 lb).Body mass index is 30.11 kg/(m^2).  General Appearance: Casual and Fairly Groomed  Patent attorney::  Good  Speech:  Clear and Coherent and Normal Rate  Volume:  Normal  Mood:  Euthymic  Affect:  Congruent and Full Range  Thought Process:  Coherent, Linear and Logical  Orientation:  Full (Time, Place, and Person)  Thought Content:  WDL  Suicidal Thoughts:  No  Homicidal Thoughts:  No  Memory:  Immediate;   Good Recent;   Fair Remote;   Fair  Judgement:  Intact  Insight:  Fair  Psychomotor Activity:  Normal  Concentration:  Good  Recall:  Fair  Akathisia:  No  Handed:  Right  AIMS (if indicated):     Assets:  Resilience  Sleep:  Number of Hours: 6   Current Medications: Current Facility-Administered Medications  Medication Dose Route Frequency  Provider Last Rate Last Dose  . acetaminophen (TYLENOL) tablet 650 mg  650 mg Oral Q6H PRN Verne Spurr, PA-C   650 mg at 03/28/13 1204  . alum & mag hydroxide-simeth (MAALOX/MYLANTA) 200-200-20 MG/5ML suspension 30 mL  30 mL Oral Q4H PRN Verne Spurr, PA-C      . cloNIDine (CATAPRES) tablet 0.1 mg  0.1 mg Oral BID Pamella Pert, MD   0.1 mg at 03/28/13 1659  . fluPHENAZine (PROLIXIN) tablet 5 mg  5 mg Oral Daily Verne Spurr, PA-C   5 mg at 03/28/13 0809  . gabapentin (NEURONTIN) capsule 100 mg  100 mg Oral BID Pamella Pert, MD   100 mg at 03/28/13 1659  . insulin aspart (novoLOG) injection 0-9 Units  0-9 Units Subcutaneous TID WC Pamella Pert, MD   5 Units at 03/28/13 1730  . lisinopril (PRINIVIL,ZESTRIL) tablet 10 mg  10 mg Oral Daily Pamella Pert, MD   10 mg at 03/28/13 1448  . magnesium hydroxide (MILK OF MAGNESIA) suspension 30 mL  30 mL Oral Daily PRN Verne Spurr, PA-C   30 mL at 03/26/13 2107  . metFORMIN (GLUCOPHAGE) tablet 500 mg  500 mg Oral BID WC Verne Spurr, PA-C   500 mg at 03/28/13 1154  . nicotine (NICODERM CQ - dosed in mg/24 hours) patch 21 mg  21 mg Transdermal Daily Nehemiah Settle, MD   21 mg at 03/28/13 0810  . traZODone (DESYREL) tablet 50 mg  50 mg Oral QHS PRN Nehemiah Settle, MD   50 mg at 03/27/13 2050    Lab Results:  Results for orders placed during the hospital encounter of 03/26/13 (from the past 48 hour(s))  GLUCOSE, CAPILLARY     Status: Abnormal   Collection Time    03/26/13  8:09 PM      Result Value Range   Glucose-Capillary 256 (*) 70 - 99 mg/dL  GLUCOSE, CAPILLARY     Status: Abnormal   Collection Time    03/27/13  4:52 AM      Result Value Range   Glucose-Capillary 214 (*) 70 - 99 mg/dL   Comment 1 Notify RN    GLUCOSE, CAPILLARY     Status: Abnormal   Collection Time    03/27/13 11:26 AM      Result Value Range   Glucose-Capillary 247 (*) 70 - 99 mg/dL   Comment 1 Notify RN    GLUCOSE, CAPILLARY     Status:  Abnormal   Collection Time    03/27/13  5:24 PM      Result Value Range   Glucose-Capillary 211 (*) 70 - 99 mg/dL   Comment 1 Notify RN    GLUCOSE, CAPILLARY     Status: Abnormal   Collection Time    03/27/13  8:38 PM      Result Value Range   Glucose-Capillary 261 (*) 70 - 99 mg/dL   Comment 1 Notify RN    GLUCOSE, CAPILLARY     Status: Abnormal   Collection Time    03/28/13  5:46 AM      Result Value Range   Glucose-Capillary 236 (*) 70 - 99 mg/dL   Comment 1 Notify RN     Comment 2 Documented in Chart    GLUCOSE, CAPILLARY     Status: Abnormal   Collection Time    03/28/13 11:39 AM      Result Value Range   Glucose-Capillary 298 (*) 70 - 99 mg/dL   Comment 1 Notify RN    GLUCOSE, CAPILLARY     Status: Abnormal   Collection Time    03/28/13  5:25 PM      Result Value Range   Glucose-Capillary 256 (*) 70 - 99 mg/dL    Physical Findings: AIMS: Facial and Oral Movements Muscles of Facial Expression: None, normal Lips and Perioral Area: None, normal Jaw: None, normal Tongue: None, normal,Extremity Movements Upper (arms, wrists, hands, fingers): None, normal Lower (legs, knees, ankles, toes): None, normal, Trunk Movements Neck, shoulders, hips: None, normal, Overall Severity Severity of abnormal movements (highest score from questions above): None, normal Incapacitation due to abnormal movements: None, normal Patient's awareness of abnormal movements (rate only patient's report): No Awareness,    CIWA:    COWS:     Treatment Plan Summary: Daily contact with patient to assess and evaluate symptoms and progress in treatment Medication management  Plan: Observation Level/Precautions: 15 minute checks   Laboratory: None   Psychotherapy: Patient to go for AA/NA groups   Medications: Continue Prolixin 5 mg,  And Trazodone   Consultations: None   Discharge Concerns:   Estimated LOS: 5-7   Other: May require Prolixin shot prior to discharge, need to clarify if the  patient has received prolixin while he was in jail.      Medical Decision Making Problem Points:  Established problem, stable/improving (1), Review of last therapy session (1) and Review of psycho-social stressors (1) Data Points:  Review or  order clinical lab tests (1) Review and summation of old records (2) Review of medication regiment & side effects (2) Review of new medications or change in dosage (2)  I certify that inpatient services furnished can reasonably be expected to improve the patient's condition.   Zanyla Klebba 03/28/2013, 6:41 PM

## 2013-03-28 NOTE — [facility] Group Notes (Signed)
[facility] Group Notes:  (Nursing/MHT/Case Management/Adjunct)  Date:  03/28/2013  Time:  10:51 AM  Type of Therapy:  Psychoeducational Skills  Participation Level:  Did Not Attend  Participation Quality:  NA  Affect:  NA  Cognitive:  NA  Insight:  None  Engagement in Group:  NA  Modes of Intervention:  NA  Summary of Progress/Problems:  Malva Limes 03/28/2013, 10:51 AM

## 2013-03-28 NOTE — [facility] Group Notes (Signed)
[facility] Group Notes: (Clinical Social Work)   03/28/2013      Type of Therapy:  Group Therapy   Participation Level:  Did Not Attend    Ambrose Mantle, LCSW 03/28/2013, 4:45 PM

## 2013-03-28 NOTE — Consult Note (Signed)
[facility] Medical Consultation  [name]:[id number] DOB: 08/16/1960 DOA: 03/26/2013 PCP: No primary provider on file.   Requesting physician: Dr. Elsie Saas Date of consultation: 03/28/2013 Reason for consultation: HTN, DM  Impression/Recommendations Active Problems:   Diabetes mellitus   Essential hypertension, benign  1. HTN - restart clonidine, add Lisinopril. Patient recalls that he used to be on Norvasc, Lisinopril and HCTZ in the past. Ideally would need to eventually come off of Clonidine. I am starting a lower dose than his previous one.  2. DM - add sliding scale insulin. Continue to monitor CBGs. 3. Neuropathic feet pain - start low dose Neurontin 100 BID, will up titrate if needed and based on response.   I will followup again tomorrow. Please contact me if I can be of assistance in the meanwhile. Thank you for this consultation.  Chief Complaint: bilateral feet pain  HPI:  52 yo M with history of HTN, DM, CHF, schizophrenia, cocaine abuse, admitted to [facility] for the past 2 days. He tells me that his main complaint is his bilateral feet pain which makes it difficult for him to walk. It feels like "burning" and that he has "pins and needles" every time he steps. He tells me that this is the main reason for his drug abuse. He has HTN diagnosed years ago for which at one point he was on three medications (sounded like Lisinopril, HCTZ and Norvasc) and even at that time his BP was hard to control. He also knows about his DM. He was in prison for the past 4 years up until 3 months ago. He was on Clonidine and on nothing for his DM -- per patient. Denies any chest pain, shortness of breath, denies headache, lightheadedness or dizziness. No GI or GU symptoms.   Review of Systems:  As per HPI otherwise negative.   Past Medical History  Diagnosis Date  . Diabetes mellitus without complication   . Hypertension   . Schizophrenia    History reviewed. No pertinent  past surgical history. Social History:  reports that he has been smoking Cigarettes.  He has been smoking about 0.00 packs per day. He uses smokeless tobacco. He reports that  drinks alcohol. He reports that he uses illicit drugs (Cocaine).  Allergies  Allergen Reactions  . Haldol [Haloperidol] Other (See Comments)    Unknown reaction   History reviewed. No pertinent family history.  Prior to Admission medications   Medication Sig Start Date End Date Taking? Authorizing Provider  cloNIDine (CATAPRES) 0.1 MG tablet Take 2 tablets (0.2 mg total) by mouth 2 (two) times daily. 02/17/13   Gilda Crease, MD  fluPHENAZine decanoate (PROLIXIN) 25 MG/ML injection Inject 25 mg into the muscle every 14 (fourteen) days.    Historical Provider, MD  metFORMIN (GLUCOPHAGE) 500 MG tablet Take 1 tablet (500 mg total) by mouth 2 (two) times daily with a meal. 02/17/13   Gilda Crease, MD   Physical Exam: Blood pressure 172/95, pulse 87, temperature 98 F (36.7 C), temperature source Oral, resp. rate 18, height 5\' 8"  (1.727 m), weight 89.812 kg (198 lb). Filed Vitals:   03/28/13 0701  BP: 172/95  Pulse: 87  Temp:   Resp:     General:  NAD  Eyes: no scleral icterus  Neck: no JVD  Cardiovascular: RRR without MRG  Respiratory: CTA biL  Abdomen: NTTP, +BS  Skin: no rashes  Musculoskeletal: no edema  Neurologic: non focal   Labs on Admission:  Basic Metabolic  Panel:  Recent Labs Lab 03/26/13 0238  NA 142  K 4.8  CL 105  GLUCOSE 189*  BUN 19  CREATININE 1.10   CBC:  Recent Labs Lab 03/26/13 0224 03/26/13 0238  WBC 5.9  --   NEUTROABS 4.0  --   HGB 12.7* 14.6  HCT 38.7* 43.0  MCV 87.4  --   PLT 238  --    CBG:  Recent Labs Lab 03/27/13 1126 03/27/13 1724 03/27/13 2038 03/28/13 0546 03/28/13 1139  GLUCAP 247* 211* 261* 236* 298*    Time spent: 55  [facility], [place] [facility] Pager [phone]  If 7PM-7AM, please contact  night-coverage [email or web address] Password [email or web address] 03/28/2013, 1:05 PM

## 2013-03-28 NOTE — Progress Notes (Signed)
Patient ID: [name], male   DOB: 1960/09/13, 52 y.o.   MRN: [id number] D. Pt presents with depressed, irritable mood and flat affect. Patient has been isolative to room throughout most of shift, with minimal interaction with peers and staff. Patient is not forthcoming with writer, but he denies any further acute concerns at this time; stating '' I'm fine, just tired ''  Pt declined to complete self inventory review at this time. Pt compliant with medications. A. Encouraged unit programming however pt has been isolative. Also noted increase in elevated blood pressure. notified Dr. Geryl Rankins in am. In no acute distress at this time. Will continue to monitor q 15 minutes for safety.

## 2013-03-29 LAB — GLUCOSE, CAPILLARY
Glucose-Capillary: 231 mg/dL — ABNORMAL HIGH (ref 70–99)
Glucose-Capillary: 380 mg/dL — ABNORMAL HIGH (ref 70–99)

## 2013-03-29 MED ORDER — INSULIN ASPART 100 UNIT/ML ~~LOC~~ SOLN: 0.0000 [IU] | Freq: Every day | SUBCUTANEOUS | Status: DC

## 2013-03-29 MED ORDER — INSULIN GLARGINE 100 UNIT/ML ~~LOC~~ SOLN
5.0000 [IU] | Freq: Every day | SUBCUTANEOUS | Status: DC
  Administered 2013-03-29: 5 [IU] via SUBCUTANEOUS

## 2013-03-29 MED ORDER — INSULIN ASPART 100 UNIT/ML ~~LOC~~ SOLN
0.0000 [IU] | Freq: Three times a day (TID) | SUBCUTANEOUS | Status: DC
  Administered 2013-03-29 – 2013-03-30 (×3): 5 [IU] via SUBCUTANEOUS

## 2013-03-29 MED ORDER — LISINOPRIL 20 MG PO TABS
20.0000 mg | ORAL_TABLET | Freq: Every day | ORAL | Status: DC
  Administered 2013-03-30: 20 mg via ORAL
  Filled 2013-03-29 (×2): qty 1

## 2013-03-29 MED ORDER — INSULIN ASPART 100 UNIT/ML ~~LOC~~ SOLN: 0.0000 [IU] | Freq: Three times a day (TID) | SUBCUTANEOUS | Status: DC

## 2013-03-29 MED ORDER — INSULIN GLARGINE 100 UNIT/ML ~~LOC~~ SOLN: 4.0000 [IU] | Freq: Every day | SUBCUTANEOUS | Status: DC

## 2013-03-29 NOTE — [facility] Group Notes (Signed)
[facility] LCSW Aftercare Discharge Planning Group Note   03/29/2013 10:19 AM    Participation Quality:  Appropraite  Mood/Affect:  Appropriate  Depression Rating:  1  Anxiety Rating:  1  Thoughts of Suicide:  No  Will you contract for safety?   NA  Current AVH:  No  Plan for Discharge/Comments:  Patient attending discharge planning group and actively participated in group.  He reports he has home and will follow up with outpatient services but does not want to attend a CD-IOP.  Patient to be referred to [facility] for follow up.  CSW provided all participants with daily workbook.   Transportation Means: Patient has transportation.   Supports:  Patient has a support system.   Anjela Cassara, Joesph July

## 2013-03-29 NOTE — Tx Team (Signed)
Interdisciplinary Treatment Plan Update   Date Reviewed:  03/29/2013  Time Reviewed:  9:38 AM  Progress in Treatment:   Attending groups: Yes Participating in groups: Yes Taking medication as prescribed: Yes  Tolerating medication: Yes Family/Significant other contact made: No, but will ask patient for consent for collateral contact Patient understands diagnosis: Yes  Discussing patient identified problems/goals with staff: Yes Medical problems stabilized or resolved: Yes Denies suicidal/homicidal ideation: Yes Patient has not harmed self or others: Yes  For review of initial/current patient goals, please see plan of care.  Estimated Length of Stay:  2-3 days  Reasons for Continued Hospitalization:  Anxiety Depression Medication stabilization  New Problems/Goals identified:    Discharge Plan or Barriers:   Home with outpatient follow up to be determined.  Additional Comments:  [name] 52 y.o. male that presented to [facility] via [facility] (stated he called them to transport him to ED), stating he was hearing voices, and has been off of his medication (Prolixin) for 3 weeks. Pt stated he missed his last shot and walked out last week from the ER while waiting for admission to our inpatient unit last week. Pt reports a hx of Schizophrenia. Pt denies visual hallucinations or delusions. Pt endorses sx of depression and anxiety. Pt also admits to longstanding hx of crack cocaine and alcohol use. Pt uses both substances daily, reporting an average of $100 crack per day, last use was this morniig before he arrived to [facility]. Pt reports he drinks one 40 oz beer per day, last use last night. Pt is calm, cooperative, oriented x 4, has normal speech.   Attendees:  Patient:  03/29/2013 9:38 AM   Signature: Mervyn Gay, MD 03/29/2013 9:38 AM  Signature:  Verne Spurr, PA 03/29/2013 9:38 AM  Signature:  03/29/2013 9:38 AM  Signature:Beverly Terrilee Croak, RN 03/29/2013 9:38 AM  Signature:  Neill Loft RN  03/29/2013 9:38 AM  Signature:  Juline Patch, LCSW 03/29/2013 9:38 AM  Signature:  Reyes Ivan, LCSW 03/29/2013 9:38 AM  Signature:  Sharin Grave Coordinator 03/29/2013 9:38 AM  Signature:   03/29/2013 9:38 AM  Signature:  03/29/2013  9:38 AM  Signature:    Signature:      Scribe for Treatment Team:   Juline Patch,  03/29/2013 9:38 AM

## 2013-03-29 NOTE — [facility] Group Notes (Addendum)
[facility] LCSW Group Therapy          Overcoming Obstacles       1:15 -2:30        03/29/2013  3:37 PM      Type of Therapy:  Group Therapy  Participation Level:  Did not attend group.   Wynn Banker 03/29/2013    3:37 PM

## 2013-03-29 NOTE — Progress Notes (Signed)
Psychoeducational Group Note  Date:  03/28/2013 Time:  2000  Group Topic/Focus:  Wrap-Up Group:   The focus of this group is to help patients review their daily goal of treatment and discuss progress on daily workbooks.  Participation Level: Did Not Attend  Participation Quality:  Not Applicable  Affect:  Not Applicable  Cognitive:  Not Applicable  Insight:  Not Applicable  Engagement in Group: Not Applicable  Additional Comments:  The patient did not attend group this evening since he was asleep in his bed.   Easten Maceachern S 03/29/2013, 12:25 AM

## 2013-03-29 NOTE — Progress Notes (Signed)
D:  Patient's self inventory sheet, patient sleeps well, good appetite, high energy level, good attention span.  Rated depression #8. Has experienced cravings.  Denied SI.  Denied physical problems.  Worst pain #2.  Will drink more water after discharge.  No problems taking meds after discharge. A:  Medications administered per MD orders.  Emotional support and encouragement given patient. R:  Denied SI and HI.  Denied A/V hallucinations.  Will continue to monitor for safety with 15 minute checks.  Safety maintained.

## 2013-03-29 NOTE — Progress Notes (Signed)
Patient ID: [name], male   DOB: [birth year], 52 y.o.   MRN: [id number] [facility] MD Progress Note  03/29/2013 10:25 PM [name]  MRN:  [id number]  Subjective: [name] rates his depression as low and his anxiety as a 7/10. He quickly escalates his symptoms at the mention of possible discharge tomorrow stating he is not ready to leave yet that he might relapse on crack cocaine. He is not interested in rehab at this time since he has been addicted since 1998 he doesn't feel he needs rehab, but is fearful that he will relapse if he leaves the hospital too early. Besides, he points out, his blood pressure is out of control and so is his diabetes and he doesn't want to leave the hospital before this is taken care of. He also states that he has only been out of prison for 90 days and prison was like rehab, but he relapsed shortly after getting out of prison.  Diagnosis:   DSM5: Schizophrenia Disorders:  Schizophrenia (295.7)   AXIS I:  Schizophrenia Disorders: Schizophrenia (295.7)  Substance/Addictive Disorders: Alcohol Related Disorder - Moderate (303.90) Cocaine use disorder  AXIS II: No diagnosis  AXIS III:  Past Medical History   Diagnosis  Date   .  Diabetes mellitus without complication    .  Hypertension    .  Schizophrenia     AXIS IV: other psychosocial or environmental problems  AXIS V: 51-60 moderate symptoms   ADL's:  Intact  Sleep: Good  Appetite:  Good  Suicidal Ideation:  Patient denies. Homicidal Ideation:  Patient denies. AEB (as evidenced by):  Psychiatric Specialty Exam: ROS  Blood pressure 176/96, pulse 88, temperature 98 F (36.7 C), temperature source Oral, resp. rate 18, height 5\' 8"  (1.727 m), weight 89.812 kg (198 lb).Body mass index is 30.11 kg/(m^2).  General Appearance: Casual and Fairly Groomed  Patent attorney::  Good  Speech:  Clear and Coherent and Normal Rate  Volume:  Normal  Mood:  Euthymic  Affect:  Congruent and Full Range   Thought Process:  Coherent, Linear and Logical  Orientation:  Full (Time, Place, and Person)  Thought Content:  WDL  Suicidal Thoughts:  No  Homicidal Thoughts:  No  Memory:  Immediate;   Good Recent;   Fair Remote;   Fair  Judgement:  poor  Insight:  Fair  Psychomotor Activity:  Normal  Concentration:  Good  Recall:  Fair  Akathisia:  No  Handed:  Right  AIMS (if indicated):     Assets:  Resilience  Sleep:  Number of Hours: 5.75   Current Medications: Current Facility-Administered Medications  Medication Dose Route Frequency Provider Last Rate Last Dose  . acetaminophen (TYLENOL) tablet 650 mg  650 mg Oral Q6H PRN Verne Spurr, PA-C   650 mg at 03/28/13 1204  . alum & mag hydroxide-simeth (MAALOX/MYLANTA) 200-200-20 MG/5ML suspension 30 mL  30 mL Oral Q4H PRN Verne Spurr, PA-C      . cloNIDine (CATAPRES) tablet 0.1 mg  0.1 mg Oral BID Pamella Pert, MD   0.1 mg at 03/29/13 1659  . fluPHENAZine (PROLIXIN) tablet 5 mg  5 mg Oral Daily Verne Spurr, PA-C   5 mg at 03/29/13 0865  . gabapentin (NEURONTIN) capsule 100 mg  100 mg Oral BID Pamella Pert, MD   100 mg at 03/29/13 1659  . insulin aspart (novoLOG) injection 0-9 Units  0-9 Units Subcutaneous TID WC Pamella Pert, MD   5 Units at 03/29/13  1710  . insulin glargine (LANTUS) injection 5 Units  5 Units Subcutaneous QHS Pamella Pert, MD   5 Units at 03/29/13 2212  . [START ON 03/30/2013] lisinopril (PRINIVIL,ZESTRIL) tablet 20 mg  20 mg Oral Daily Costin Gherghe, MD      . magnesium hydroxide (MILK OF MAGNESIA) suspension 30 mL  30 mL Oral Daily PRN Verne Spurr, PA-C   30 mL at 03/26/13 2107  . metFORMIN (GLUCOPHAGE) tablet 500 mg  500 mg Oral BID WC Verne Spurr, PA-C   500 mg at 03/29/13 1211  . nicotine (NICODERM CQ - dosed in mg/24 hours) patch 21 mg  21 mg Transdermal Daily Nehemiah Settle, MD   21 mg at 03/28/13 0810  . traZODone (DESYREL) tablet 50 mg  50 mg Oral QHS PRN Nehemiah Settle, MD   50  mg at 03/29/13 2215    Lab Results:  Results for orders placed during the hospital encounter of 03/26/13 (from the past 48 hour(s))  GLUCOSE, CAPILLARY     Status: Abnormal   Collection Time    03/28/13  5:46 AM      Result Value Range   Glucose-Capillary 236 (*) 70 - 99 mg/dL   Comment 1 Notify RN     Comment 2 Documented in Chart    GLUCOSE, CAPILLARY     Status: Abnormal   Collection Time    03/28/13 11:39 AM      Result Value Range   Glucose-Capillary 298 (*) 70 - 99 mg/dL   Comment 1 Notify RN    GLUCOSE, CAPILLARY     Status: Abnormal   Collection Time    03/28/13  5:25 PM      Result Value Range   Glucose-Capillary 256 (*) 70 - 99 mg/dL  GLUCOSE, CAPILLARY     Status: Abnormal   Collection Time    03/28/13  9:07 PM      Result Value Range   Glucose-Capillary 257 (*) 70 - 99 mg/dL   Comment 1 Notify RN    GLUCOSE, CAPILLARY     Status: Abnormal   Collection Time    03/29/13  6:10 AM      Result Value Range   Glucose-Capillary 231 (*) 70 - 99 mg/dL  GLUCOSE, CAPILLARY     Status: Abnormal   Collection Time    03/29/13 11:50 AM      Result Value Range   Glucose-Capillary 314 (*) 70 - 99 mg/dL  GLUCOSE, CAPILLARY     Status: Abnormal   Collection Time    03/29/13  5:02 PM      Result Value Range   Glucose-Capillary 300 (*) 70 - 99 mg/dL   Comment 1 Notify RN    GLUCOSE, CAPILLARY     Status: Abnormal   Collection Time    03/29/13  9:40 PM      Result Value Range   Glucose-Capillary 380 (*) 70 - 99 mg/dL   Comment 1 Notify RN      Physical Findings: AIMS: Facial and Oral Movements Muscles of Facial Expression: None, normal Lips and Perioral Area: None, normal Jaw: None, normal Tongue: None, normal,Extremity Movements Upper (arms, wrists, hands, fingers): None, normal Lower (legs, knees, ankles, toes): None, normal, Trunk Movements Neck, shoulders, hips: None, normal, Overall Severity Severity of abnormal movements (highest score from questions above):  None, normal Incapacitation due to abnormal movements: None, normal Patient's awareness of abnormal movements (rate only patient's report): No Awareness, Dental Status Current problems with  teeth and/or dentures?: No Does patient usually wear dentures?: No  CIWA:  CIWA-Ar Total: 2 COWS:  COWS Total Score: 2  Treatment Plan Summary: Daily contact with patient to assess and evaluate symptoms and progress in treatment Medication management  Plan: Observation Level/Precautions: 15 minute checks   Laboratory: None   Psychotherapy: Patient to go for AA/NA groups   Medications: Continue Prolixin 5 mg,  And Trazodone   Consultations: None   Discharge Concerns:   Estimated LOS: D/C in AM  Other: May require Prolixin shot prior to discharge, need to clarify if the patient has received prolixin while he was in jail.    Medical Decision Making Problem Points:  Established problem, stable/improving (1), Review of last therapy session (1) and Review of psycho-social stressors (1) Data Points:  Review or order clinical lab tests (1) Review and summation of old records (2) Review of medication regiment & side effects (2) Review of new medications or change in dosage (2)  I certify that inpatient services furnished can reasonably be expected to improve the patient's condition.   Rona Ravens. [place] RPAC 10:32 PM 03/29/2013  Reviewed the information documented and agree with the treatment plan.  Dabney Dever,JANARDHAHA R. 03/29/2013 11:10 PM

## 2013-03-29 NOTE — Progress Notes (Signed)
Patient ID: [name], male   DOB: 10-Nov-1960, 52 y.o.   MRN: [id number] PER STATE REGULATIONS 482.30  THIS CHART WAS REVIEWED FOR MEDICAL NECESSITY WITH RESPECT TO THE PATIENT'S ADMISSION/ DURATION OF STAY.  NEXT REVIEW DATE: 04/02/2013  Willa Rough, RN, BSN CASE MANAGER

## 2013-03-29 NOTE — Progress Notes (Signed)
Adult Psychoeducational Group Note  Date:  03/29/2013 Time:  11:00am  Group Topic/Focus:  Self Care:   The focus of this group is to help patients understand the importance of self-care in order to improve or restore emotional, physical, spiritual, interpersonal, and financial health.  Participation Level:  Active  Participation Quality:  Appropriate and Attentive  Affect:  Appropriate  Cognitive:  Alert and Appropriate  Insight: Appropriate  Engagement in Group:  Engaged  Modes of Intervention:  Discussion and Education  Additional Comments:  Pt attended and participated in group. When ask what he could do for self care after discharge pt stated to stay away from crack, taking medicines, and having a relationship with his kids.  Shelly Bombard D 03/29/2013, 1:44 PM

## 2013-03-29 NOTE — ED Provider Notes (Signed)
Medical screening examination/treatment/procedure(s) were performed by non-physician practitioner and as supervising physician I was immediately available for consultation/collaboration.    Shiloh Swopes R Riley Hallum, MD 03/29/13 1018 

## 2013-03-29 NOTE — Progress Notes (Addendum)
1. HTN - still hypertensive, will increase Lisinopril and monitor.  2. DM - still elevated sugars. Add low dose Lantus tonight.   Will follow up with the patient tomorrow.   Costin M. Elvera Lennox, MD [facility] [phone]

## 2013-03-30 MED ORDER — CLONIDINE HCL 0.2 MG PO TABS
0.2000 mg | ORAL_TABLET | Freq: Two times a day (BID) | ORAL | Status: DC
  Filled 2013-03-30 (×2): qty 28

## 2013-03-30 MED ORDER — TRAZODONE HCL 50 MG PO TABS: 50.0000 mg | ORAL_TABLET | Freq: Every evening | ORAL | Status: DC | PRN

## 2013-03-30 MED ORDER — CLONIDINE HCL 0.1 MG PO TABS: 0.2000 mg | ORAL_TABLET | Freq: Two times a day (BID) | ORAL | Status: DC

## 2013-03-30 MED ORDER — INSULIN GLARGINE 100 UNIT/ML ~~LOC~~ SOLN: 5.0000 [IU] | Freq: Every day | SUBCUTANEOUS | Status: DC

## 2013-03-30 MED ORDER — FLUPHENAZINE DECANOATE 25 MG/ML IJ SOLN
25.0000 mg | INTRAMUSCULAR | Status: DC
Start: 2013-03-30 — End: 2013-04-23

## 2013-03-30 MED ORDER — METFORMIN HCL 500 MG PO TABS: 500.0000 mg | ORAL_TABLET | Freq: Two times a day (BID) | ORAL | Status: DC

## 2013-03-30 MED ORDER — FLUPHENAZINE HCL 5 MG PO TABS: 5.0000 mg | ORAL_TABLET | Freq: Every day | ORAL | Status: DC

## 2013-03-30 MED ORDER — LISINOPRIL 20 MG PO TABS: 20.0000 mg | ORAL_TABLET | Freq: Every day | ORAL | Status: DC

## 2013-03-30 MED ORDER — GABAPENTIN 100 MG PO CAPS: 100.0000 mg | ORAL_CAPSULE | Freq: Two times a day (BID) | ORAL | Status: DC

## 2013-03-30 NOTE — Progress Notes (Signed)
Patient ID: [name], male   DOB: [birth year], 52 y.o.   MRN: [id number] Discharge Note:  Patient discharged to relative's home, has bus ticket.  Denied SI and HI.  Denied A/V hallucinations.   Denied pain.  Suicide prevention information given to patient and discussed with patient who stated he understood and had no questions.  Patient received all his belongings, clothing, miscellaneous items, toiletries, prescriptions and medications.  Patient stated he appreciated all assistance received from staff.

## 2013-03-30 NOTE — Progress Notes (Signed)
[facility] Adult Case Management Discharge Plan :  Will you be returning to the same living situation after discharge: Yes,  Patient is returning to his previous living environment. At discharge, do you have transportation home?:Yes,  Patient assisted with a bus pass. Do you have the ability to pay for your medications:Yes,  Patient has Medicaid  Release of information consent forms completed and in the chart;  Patient's signature needed at discharge.  Patient to Follow up at: Follow-up Information   Follow up with [facility] On 04/01/2013. (Please go to [facility] on Thursday, April 01, 2013 or any weekday between 8AM -12 Noon for medication managment and counseling.)    Contact information:   [address] [place], [place]   [address]  [phone]      Patient denies SI/HI:   Patient no longer endorsing SI/HI or other thoughts of self harm.     Safety Planning and Suicide Prevention discussed:  .Reviewed with all patients during discharge planning group   Iwalani Templeton, Joesph July 03/30/2013, 2:50 PM

## 2013-03-30 NOTE — [facility] Suicide Risk Assessment (Signed)
Suicide Risk Assessment  Discharge Assessment     Demographic Factors:  Male, Adolescent or young adult, Low socioeconomic status and Unemployed  Mental Status Per Nursing Assessment::   On Admission:     Current Mental Status by Physician: Mental Status Examination: Patient appeared as per his stated age, casually dressed, and fairly groomed, and maintaining good eye contact. Patient has good mood and his affect was constricted. He has normal rate, rhythm, and volume of speech. His thought process is linear and goal directed. Patient has denied suicidal, homicidal ideations, intentions or plans. Patient has no evidence of auditory or visual hallucinations, delusions, and paranoia. Patient has fair insight judgment and impulse control.  Loss Factors: Financial problems/change in socioeconomic status  Historical Factors: NA  Risk Reduction Factors:   Sense of responsibility to family, Religious beliefs about death, Living with another person, especially a relative, Positive social support, Positive therapeutic relationship and Positive coping skills or problem solving skills  Continued Clinical Symptoms:  Schizophrenia:   Paranoid or undifferentiated type Previous Psychiatric Diagnoses and Treatments Medical Diagnoses and Treatments/Surgeries  Cognitive Features That Contribute To Risk:  Polarized thinking    Suicide Risk:  Minimal: No identifiable suicidal ideation.  Patients presenting with no risk factors but with morbid ruminations; may be classified as minimal risk based on the severity of the depressive symptoms  Discharge Diagnoses:   AXIS I:  Schizoaffective Disorder AXIS II:  Deferred AXIS III:   Past Medical History  Diagnosis Date  . Diabetes mellitus without complication   . Hypertension   . Schizophrenia    AXIS IV:  economic problems, other psychosocial or environmental problems and problems related to social environment AXIS V:  61-70 mild symptoms  Plan  Of Care/Follow-up recommendations:  Activity:  as tolerated Diet:  regular  Is patient on multiple antipsychotic therapies at discharge:  No   Has Patient had three or more failed trials of antipsychotic monotherapy by history:  No  Recommended Plan for Multiple Antipsychotic Therapies: NA  Chaelyn Bunyan,JANARDHAHA R. 03/30/2013, 12:14 PM

## 2013-03-30 NOTE — Progress Notes (Signed)
D:  Patient's self inventory form, patient has fair sleep, good appetite, low energy level, improving attention span.  Rated depression #5, hopeless #3, anxiety #4.  Denied withdrawals.  Denied SI.  Denied physical problems.  After discharge, plans to go to appointments and take meds.  No discharge plans.  No problems taking meds after discharge. A:  Medications administered per MD orders.  Emotional support and encouragement given patient. R:  Denied SI and HI.  Denied A/V hallucinations.  Denied pain.  Will continue to monitor patient for safety with 15 minute checks.  Safety maintained.

## 2013-03-30 NOTE — [facility] Suicide Risk Assessment (Signed)
[facility] INPATIENT:  Family/Significant Other Suicide Prevention Education  Suicide Prevention Education:  Education Completed; Shawn Stall, [phone];  has been identified by the patient as the family member/significant other with whom the patient will be residing, and identified as the person(s) who will aid the patient in the event of a mental health crisis (suicidal ideations/suicide attempt).  With written consent from the patient, the family member/significant other has been provided the following suicide prevention education, prior to the and/or following the discharge of the patient.  The suicide prevention education provided includes the following:  Suicide risk factors  Suicide prevention and interventions  National Suicide Hotline telephone number  [facility] assessment telephone number  [facility] Emergency Assistance 911  [facility] and/or Residential Mobile Crisis Unit telephone number  Request made of family/significant other to:  Remove weapons (e.g., guns, rifles, knives), all items previously/currently identified as safety concern.  Cousin advised patient does not have access to guns.  Remove drugs/medications (over-the-counter, prescriptions, illicit drugs), all items previously/currently identified as a safety concern.  The family member/significant other verbalizes understanding of the suicide prevention education information provided.  The family member/significant other agrees to remove the items of safety concern listed above.  Wynn Banker 03/30/2013, 11:27 AM

## 2013-03-30 NOTE — Progress Notes (Signed)
Patient ID: [name], male   DOB: [birth year], 52 y.o.   MRN: [id number]  D: Pt approached the writer asking to be switched to another room. "Me and him just don't get along, we don't match." Pt stated his roommate wants the temperature warmer/colder than he does, and also began speaking of other reasons that the writer can't recall. Writer informed the pt that he could sleep in the quiet room, but would have to move immediately if the room was needed.  A:  Support and encouragement was offered. 15 min checks continued for safety.  R: Pt remains safe.

## 2013-03-30 NOTE — Progress Notes (Signed)
Adult Psychoeducational Group Note  Date:  03/30/2013 Time:  11:00am Group Topic/Focus:  Recovery Goals:   The focus of this group is to identify appropriate goals for recovery and establish a plan to achieve them.  Participation Level:  Did Not Attend  Participation Quality:    Affect:    Cognitive:    Insight:   Engagement in Group:   Modes of Intervention:    Additional Comments:  Pt did not attend group.  Shelly Bombard D 03/30/2013, 1:34 PM

## 2013-03-30 NOTE — Progress Notes (Signed)
Adult Psychoeducational Group Note  Date:  03/30/2013 Time:  9:36 AM  Group Topic/Focus:  Orientation:   The focus of this group is to educate the patient on the purpose and policies of crisis stabilization and provide a format to answer questions about their admission.  The group details unit policies and expectations of patients while admitted.  Participation Level:  Did Not Attend  Participation Quality:    Affect:    Cognitive:    Insight:   Engagement in Group:    Modes of Intervention:    Additional Comments:  Pt did not attend.  [name] M 03/30/2013, 9:36 AM

## 2013-03-30 NOTE — Consult Note (Signed)
Note reviewed and agreed with  

## 2013-03-30 NOTE — Clinical Social Work Note (Signed)
Writer spoke with patient's cousin who recommended patient going into a treatment center for SA.  She was informed we have to work with the type of treatment patient is requesting.  Cousin had no other concerns.

## 2013-03-31 LAB — GLUCOSE, CAPILLARY: Glucose-Capillary: 286 mg/dL — ABNORMAL HIGH (ref 70–99)

## 2013-04-02 ENCOUNTER — Emergency Department (HOSPITAL_COMMUNITY)
Admission: EM | Admit: 2013-04-02 | Discharge: 2013-04-02 | Disposition: A | Discharge status: Home / Self Care | Payer: Medicare Other | Attending: Emergency Medicine | Admitting: Emergency Medicine

## 2013-04-02 ENCOUNTER — Encounter (HOSPITAL_COMMUNITY): Payer: Self-pay | Admitting: *Deleted

## 2013-04-02 DIAGNOSIS — Z794 Long term (current) use of insulin: Secondary | ICD-10-CM | POA: Insufficient documentation

## 2013-04-02 DIAGNOSIS — E1140 Type 2 diabetes mellitus with diabetic neuropathy, unspecified: Secondary | ICD-10-CM

## 2013-04-02 DIAGNOSIS — E1149 Type 2 diabetes mellitus with other diabetic neurological complication: Secondary | ICD-10-CM | POA: Insufficient documentation

## 2013-04-02 DIAGNOSIS — I10 Essential (primary) hypertension: Secondary | ICD-10-CM | POA: Insufficient documentation

## 2013-04-02 DIAGNOSIS — Z79899 Other long term (current) drug therapy: Secondary | ICD-10-CM | POA: Insufficient documentation

## 2013-04-02 DIAGNOSIS — E1142 Type 2 diabetes mellitus with diabetic polyneuropathy: Secondary | ICD-10-CM | POA: Insufficient documentation

## 2013-04-02 DIAGNOSIS — F17200 Nicotine dependence, unspecified, uncomplicated: Secondary | ICD-10-CM | POA: Insufficient documentation

## 2013-04-02 DIAGNOSIS — F209 Schizophrenia, unspecified: Secondary | ICD-10-CM | POA: Insufficient documentation

## 2013-04-02 LAB — GLUCOSE, CAPILLARY: Glucose-Capillary: 231 mg/dL — ABNORMAL HIGH (ref 70–99)

## 2013-04-02 NOTE — ED Notes (Signed)
Bed: WA07 Expected date:  Expected time:  Means of arrival:  Comments: 

## 2013-04-02 NOTE — ED Notes (Signed)
Bed: EX52 Expected date:  Expected time:  Means of arrival:  Comments: Hold for Room 7

## 2013-04-02 NOTE — ED Provider Notes (Addendum)
CSN: [id number]     Arrival date & time 04/02/13  0225 History   First MD Initiated Contact with Patient 04/02/13 0246     Chief Complaint  Patient presents with  . Hyperglycemia   (Consider location/radiation/quality/duration/timing/severity/associated sxs/prior Treatment) HPI Comments: Pt brought in by EMS because he stated he wanted his BP and CBG controlled before he went home.  When I saw the pt he states that he has feet problems and want to know how to fix them.  He says he is taking his meds and has not skipped any doses.  Denies SI or HI.  He denies any SOB, CP, cough, abd pain or N/V/D  Patient is a 52 y.o. male presenting with hyperglycemia. The history is provided by the patient.  Hyperglycemia Blood sugar level PTA:  203 Severity:  Mild   Past Medical History  Diagnosis Date  . Diabetes mellitus without complication   . Hypertension   . Schizophrenia    History reviewed. No pertinent past surgical history. No family history on file. History  Substance Use Topics  . Smoking status: Current Every Day Smoker    Types: Cigarettes  . Smokeless tobacco: Current User  . Alcohol Use: Yes    Review of Systems  All other systems reviewed and are negative.    Allergies  Haldol  Home Medications   Current Outpatient Rx  Name  Route  Sig  Dispense  Refill  . cloNIDine (CATAPRES) 0.1 MG tablet   Oral   Take 2 tablets (0.2 mg total) by mouth 2 (two) times daily. For hypertension.   60 tablet   0   . fluPHENAZine (PROLIXIN) 5 MG tablet   Oral   Take 1 tablet (5 mg total) by mouth daily. For  Psychosis and agitation.   30 tablet   0   . fluPHENAZine decanoate (PROLIXIN) 25 MG/ML injection   Intramuscular   Inject 1 mL (25 mg total) into the muscle every 14 (fourteen) days. For schizophrenia   5 mL   0   . gabapentin (NEURONTIN) 100 MG capsule   Oral   Take 1 capsule (100 mg total) by mouth 2 (two) times daily. For anxiety and agitation.   60 capsule   0   . insulin glargine (LANTUS) 100 UNIT/ML injection   Subcutaneous   Inject 0.05 mLs (5 Units total) into the skin at bedtime. For hyperglycemic control.   10 mL   0   . lisinopril (PRINIVIL,ZESTRIL) 20 MG tablet   Oral   Take 1 tablet (20 mg total) by mouth daily. For hypertension.   30 tablet   0   . metFORMIN (GLUCOPHAGE) 500 MG tablet   Oral   Take 1 tablet (500 mg total) by mouth 2 (two) times daily with a meal. For glycemic control.   60 tablet   0   . traZODone (DESYREL) 50 MG tablet   Oral   Take 1 tablet (50 mg total) by mouth at bedtime as needed for sleep. For insomnia.   30 tablet   0    BP 151/72  Pulse 102  Temp(Src) 98.4 F (36.9 C) (Oral)  Resp 18  SpO2 94% Physical Exam  Nursing note and vitals reviewed. Constitutional: He is oriented to person, place, and time. He appears well-developed and well-nourished. No distress.  Poor hygiene  HENT:  Head: Normocephalic and atraumatic.  Mouth/Throat: Oropharynx is clear and moist.  Eyes: EOM are normal. Pupils are equal, round, and reactive to  light. Right conjunctiva is injected. Left conjunctiva is injected.  Neck: Normal range of motion. Neck supple.  Cardiovascular: Normal rate, regular rhythm and intact distal pulses.   No murmur heard. Pulmonary/Chest: Effort normal and breath sounds normal. No respiratory distress. He has no wheezes. He has no rales.  Abdominal: Soft. He exhibits no distension. There is no tenderness. There is no rebound and no guarding.  Musculoskeletal: Normal range of motion. He exhibits no edema and no tenderness.  Bilateral feet with calluses but no ulcers present  Neurological: He is alert and oriented to person, place, and time.  Skin: Skin is warm and dry. No rash noted. No erythema.  Psychiatric: He has a normal mood and affect. His behavior is normal.    ED Course  Procedures (including critical care time) Labs Review Labs Reviewed  GLUCOSE, CAPILLARY - Abnormal; Notable  for the following:    Glucose-Capillary 231 (*)    All other components within normal limits   Imaging Review No results found.  MDM   1. Diabetic neuropathy     Patient brought in by EMS initially stating that he wanted his blood sugar evaluated and his blood pressure. Here patient's mildly tachycardic with a blood pressure of 151/72. He is complaining that his feet hurt this appears to be his chronic diabetic neuropathy. He has no signs of diabetic ulcers and had a negative exam. Patient states he wants to soak his feet to help him feel better. He was given a pain and taking take home to do this. Patient displays no signs of acute distress and a fingerstick blood sugar was 230. At this time do not feel that patient needs any further evaluation. He will be discharged home.    Gwyneth Sprout, MD 04/02/13 4540  Gwyneth Sprout, MD 04/02/13 9811  Gwyneth Sprout, MD 04/02/13 0400

## 2013-04-02 NOTE — Progress Notes (Signed)
Patient Discharge Instructions:  After Visit Summary (AVS):   Faxed to:  04/02/13 Psychiatric Admission Assessment Note:   Faxed to:  04/02/13 Suicide Risk Assessment - Discharge Assessment:   Faxed to:  04/02/13 Faxed/Sent to the Next Level Care provider:  04/02/13 Faxed to [facility] @ [phone]  Jerelene Redden, 04/02/2013, 4:08 PM

## 2013-04-02 NOTE — ED Notes (Signed)
Pt brought to ER via EMS for complaints of elevated blood sugar per pt's reports; pt's CBG via EMS is 203. Pt denies pain other than chronic bilateral foot pain; pt also states that his BP is elevated; pt states "I want to get my blood pressure and blood sugar stable and then go home."

## 2013-04-02 NOTE — ED Notes (Signed)
Notified RN, Misty Stanley pt. CBG 231.

## 2013-04-04 NOTE — Discharge Summary (Signed)
Physician Discharge Summary Note  Patient:  [name] is an 52 y.o., male MRN:  [id number] DOB:  1960-07-09 Patient phone:  [phone] (home)  Patient address:   No address on file.,   Date of Admission:  03/26/2013 Date of Discharge: 03/30/2013  Reason for Admission:  psychosis  Discharge Diagnoses: Active Problems:   Diabetes mellitus   Essential hypertension, benign  ROS  DSM5: DSM5:  AXIS I:  Schizophrenia Disorders: Schizophrenia (295.7)  Substance/Addictive Disorders: Alcohol Related Disorder - Moderate (303.90) Cocaine use disorder  AXIS II: No diagnosis  AXIS III:  Past Medical History   Diagnosis  Date   .  Diabetes mellitus without complication    .  Hypertension    .  Schizophrenia     AXIS IV: other psychosocial or environmental problems  AXIS V: 51-60 moderate symptoms  Level of Care:  OP  Hospital Course:         Kiran was admitted after he reported to the [facility] asking for assistance with his Crack problem and stating that he had been off of his prolixin for 3 or 4 weeks.  He noted that he uses crack daily as well as alcohol. He asked for help with his problems.  He as accepted to [facility] for crisis management and stabilization. His labs indicated O alcohol and UDS was + for cocaine.          Upon admission to the unit,[name] was evaluated and his symptoms identified. He was oriented to the unit and encouraged to participate in unit programming. Medical problems were identified and treated appropriately. Home medication was restarted as needed. Psychiatric medication management was initiated.        The patient was evaluated each day by a clinical provider to ascertain the patient's response to treatment.  Improvement was noted by the patient's report of decreasing symptoms, improved sleep and appetite, affect, medication tolerance, behavior, and participation in unit programming.  [name] L Whitsettwas asked each day to complete a self inventory  noting mood, mental status, pain, new symptoms, anxiety and concerns.         He responded well to medication and being in a therapeutic and supportive environment. Positive and appropriate behavior was noted and the patient was motivated for recovery. The patient worked closely with the treatment team and case manager to develop a discharge plan with appropriate goals. Coping skills, problem solving as well as relaxation therapies were also part of the unit programming.         By the day of discharge Fredric was in much improved condition than upon admission.  Symptoms were reported as significantly decreased or resolved completely. The patient denied SI/HI and voiced no AVH. He was motivated to continue taking medication with a goal of continued improvement in mental health.          [name] was discharged home with a plan to follow up as noted below.   Consults:  None  Significant Diagnostic Studies:  None  Discharge Vitals:   Blood pressure 159/90, pulse 98, temperature 98.5 F (36.9 C), temperature source Oral, resp. rate 16, height 5\' 8"  (1.727 m), weight 89.812 kg (198 lb). Body mass index is 30.11 kg/(m^2). Lab Results:   No results found for this or any previous visit (from the past 72 hour(s)).  Physical Findings: AIMS: Facial and Oral Movements Muscles of Facial Expression: None, normal Lips and Perioral Area: None, normal Jaw: None, normal Tongue: None, normal,Extremity Movements Upper (arms,  wrists, hands, fingers): None, normal Lower (legs, knees, ankles, toes): None, normal, Trunk Movements Neck, shoulders, hips: None, normal, Overall Severity Severity of abnormal movements (highest score from questions above): None, normal Incapacitation due to abnormal movements: None, normal Patient's awareness of abnormal movements (rate only patient's report): No Awareness, Dental Status Current problems with teeth and/or dentures?: No Does patient usually wear  dentures?: No  CIWA:  CIWA-Ar Total: 2 COWS:  COWS Total Score: 2  Psychiatric Specialty Exam: See Psychiatric Specialty Exam and Suicide Risk Assessment completed by Attending Physician prior to discharge.  Discharge destination:  Home  Is patient on multiple antipsychotic therapies at discharge:  No   Has Patient had three or more failed trials of antipsychotic monotherapy by history:  No  Recommended Plan for Multiple Antipsychotic Therapies: NA  Discharge Orders   Future Orders Complete By Expires   Diet - low sodium heart healthy  As directed    Discharge instructions  As directed    Comments:     Take all of your medications as directed. Be sure to keep all of your follow up appointments.  If you are unable to keep your follow up appointment, call your Doctor's office to let them know, and reschedule.  Make sure that you have enough medication to last until your appointment. Be sure to get plenty of rest. Going to bed at the same time each night will help. Try to avoid sleeping during the day.  Increase your activity as tolerated. Regular exercise will help you to sleep better and improve your mental health. Eating a heart healthy diet is recommended. Try to avoid salty or fried foods. Be sure to avoid all alcohol and illegal drugs.   Increase activity slowly  As directed        Medication List       Indication   cloNIDine 0.1 MG tablet  Commonly known as:  CATAPRES  Take 2 tablets (0.2 mg total) by mouth 2 (two) times daily. For hypertension.   Indication:  High Blood Pressure     fluPHENAZine 5 MG tablet  Commonly known as:  PROLIXIN  Take 1 tablet (5 mg total) by mouth daily. For  Psychosis and agitation.   Indication:  Psychosis, Schizophrenia     fluPHENAZine decanoate 25 MG/ML injection  Commonly known as:  PROLIXIN  Inject 1 mL (25 mg total) into the muscle every 14 (fourteen) days. For schizophrenia   Indication:  Schizophrenia     gabapentin 100 MG capsule   Commonly known as:  NEURONTIN  Take 1 capsule (100 mg total) by mouth 2 (two) times daily. For anxiety and agitation.   Indication:  Agitation     insulin glargine 100 UNIT/ML injection  Commonly known as:  LANTUS  Inject 0.05 mLs (5 Units total) into the skin at bedtime. For hyperglycemic control.   Indication:  Type 2 Diabetes     lisinopril 20 MG tablet  Commonly known as:  PRINIVIL,ZESTRIL  Take 1 tablet (20 mg total) by mouth daily. For hypertension.   Indication:  High Blood Pressure     metFORMIN 500 MG tablet  Commonly known as:  GLUCOPHAGE  Take 1 tablet (500 mg total) by mouth 2 (two) times daily with a meal. For glycemic control.   Indication:  Type 2 Diabetes     traZODone 50 MG tablet  Commonly known as:  DESYREL  Take 1 tablet (50 mg total) by mouth at bedtime as needed for sleep. For insomnia.  Indication:  Trouble Sleeping           Follow-up Information   Follow up with [facility] On 04/01/2013. (Please go to [facility] on Thursday, April 01, 2013 or any weekday between 8AM -12 Noon for medication managment and counseling.)    Contact information:   [address] [place], [place]   [address]  [phone]      Follow-up recommendations:   Activities: Resume activity as tolerated. Diet: Heart healthy low sodium diet Tests: Follow up testing will be determined by your out patient provider.  Comments:    Total Discharge Time:  Less than 30 minutes.  Signed: MASHBURN,NEIL 04/04/2013, 11:03 PM  Patient is evaluated, completed suicidal risk assessment and case discussed with treatment team and developed discharge plan. Reviewed the information documented and agree with the treatment plan.  Shannah Conteh,JANARDHAHA R. 04/05/2013 3:58 PM

## 2013-04-05 ENCOUNTER — Emergency Department (HOSPITAL_COMMUNITY)
Admission: EM | Admit: 2013-04-05 | Discharge: 2013-04-05 | Disposition: A | Discharge status: Home / Self Care | Payer: Medicare Other | Attending: Emergency Medicine | Admitting: Emergency Medicine

## 2013-04-05 ENCOUNTER — Encounter (HOSPITAL_COMMUNITY): Payer: Self-pay | Admitting: *Deleted

## 2013-04-05 DIAGNOSIS — F209 Schizophrenia, unspecified: Secondary | ICD-10-CM

## 2013-04-05 DIAGNOSIS — Z9119 Patient's noncompliance with other medical treatment and regimen: Secondary | ICD-10-CM | POA: Insufficient documentation

## 2013-04-05 DIAGNOSIS — F1210 Cannabis abuse, uncomplicated: Secondary | ICD-10-CM

## 2013-04-05 DIAGNOSIS — I10 Essential (primary) hypertension: Secondary | ICD-10-CM | POA: Insufficient documentation

## 2013-04-05 DIAGNOSIS — E119 Type 2 diabetes mellitus without complications: Secondary | ICD-10-CM | POA: Insufficient documentation

## 2013-04-05 DIAGNOSIS — F1410 Cocaine abuse, uncomplicated: Secondary | ICD-10-CM | POA: Insufficient documentation

## 2013-04-05 DIAGNOSIS — Z79899 Other long term (current) drug therapy: Secondary | ICD-10-CM | POA: Insufficient documentation

## 2013-04-05 DIAGNOSIS — Z9114 Patient's other noncompliance with medication regimen: Secondary | ICD-10-CM

## 2013-04-05 DIAGNOSIS — F17200 Nicotine dependence, unspecified, uncomplicated: Secondary | ICD-10-CM | POA: Insufficient documentation

## 2013-04-05 DIAGNOSIS — Z91199 Patient's noncompliance with other medical treatment and regimen due to unspecified reason: Secondary | ICD-10-CM | POA: Insufficient documentation

## 2013-04-05 LAB — CBC
HCT: 35.9 % — ABNORMAL LOW (ref 39.0–52.0)
Hemoglobin: 12 g/dL — ABNORMAL LOW (ref 13.0–17.0)
MCH: 29.6 pg (ref 26.0–34.0)
MCHC: 33.4 g/dL (ref 30.0–36.0)
MCV: 88.4 fL (ref 78.0–100.0)
RBC: 4.06 MIL/uL — ABNORMAL LOW (ref 4.22–5.81)
RDW: 14.5 % (ref 11.5–15.5)

## 2013-04-05 LAB — RAPID URINE DRUG SCREEN, HOSP PERFORMED
Amphetamines: NOT DETECTED
Benzodiazepines: NOT DETECTED
Opiates: NOT DETECTED

## 2013-04-05 LAB — COMPREHENSIVE METABOLIC PANEL
ALT: 30 U/L (ref 0–53)
Alkaline Phosphatase: 112 U/L (ref 39–117)
BUN: 11 mg/dL (ref 6–23)
CO2: 28 mEq/L (ref 19–32)
Calcium: 9 mg/dL (ref 8.4–10.5)
Creatinine, Ser: 0.86 mg/dL (ref 0.50–1.35)
GFR calc Af Amer: >90 mL/min (ref >90)
GFR calc non Af Amer: >90 mL/min (ref >90)
Glucose, Bld: 373 mg/dL — ABNORMAL HIGH (ref 70–99)
Potassium: 3.6 mEq/L (ref 3.5–5.1)

## 2013-04-05 LAB — SALICYLATE LEVEL: Salicylate Lvl: <2 mg/dL — ABNORMAL LOW (ref 2.8–20.0)

## 2013-04-05 LAB — ETHANOL: Alcohol, Ethyl (B): <11 mg/dL (ref 0–11)

## 2013-04-05 LAB — ACETAMINOPHEN LEVEL: Acetaminophen (Tylenol), Serum: <15 ug/mL (ref 10–30)

## 2013-04-05 LAB — GLUCOSE, CAPILLARY

## 2013-04-05 MED ORDER — LISINOPRIL 20 MG PO TABS
20.0000 mg | ORAL_TABLET | Freq: Every day | ORAL | Status: DC
  Administered 2013-04-05: 20 mg via ORAL
  Filled 2013-04-05 (×2): qty 1

## 2013-04-05 MED ORDER — TRAZODONE HCL 50 MG PO TABS: 50.0000 mg | ORAL_TABLET | Freq: Every evening | ORAL | Status: DC | PRN

## 2013-04-05 MED ORDER — METFORMIN HCL 500 MG PO TABS
500.0000 mg | ORAL_TABLET | Freq: Two times a day (BID) | ORAL | Status: DC
  Administered 2013-04-05: 500 mg via ORAL
  Filled 2013-04-05: qty 1

## 2013-04-05 MED ORDER — CLONIDINE HCL 0.2 MG PO TABS
0.2000 mg | ORAL_TABLET | Freq: Two times a day (BID) | ORAL | Status: DC
  Administered 2013-04-05 (×2): 0.2 mg via ORAL
  Filled 2013-04-05 (×2): qty 1

## 2013-04-05 MED ORDER — INSULIN GLARGINE 100 UNIT/ML ~~LOC~~ SOLN
5.0000 [IU] | Freq: Every day | SUBCUTANEOUS | Status: DC
  Administered 2013-04-05: 5 [IU] via SUBCUTANEOUS
  Filled 2013-04-05: qty 0.05

## 2013-04-05 MED ORDER — GABAPENTIN 100 MG PO CAPS
100.0000 mg | ORAL_CAPSULE | Freq: Two times a day (BID) | ORAL | Status: DC
  Administered 2013-04-05: 100 mg via ORAL
  Filled 2013-04-05 (×2): qty 1

## 2013-04-05 MED ORDER — FLUPHENAZINE HCL 5 MG PO TABS
5.0000 mg | ORAL_TABLET | Freq: Every day | ORAL | Status: DC
  Administered 2013-04-05: 5 mg via ORAL
  Filled 2013-04-05 (×2): qty 1

## 2013-04-05 NOTE — BH Assessment (Signed)
Tele Assessment Note   [name] is an 52 y.o. male, African-American who presents to [facility] stating that he has relapsed on crack, he is hearing voices and that he wants help. Pt has a history of paranoid schizophrenia and cocaine dependence and was discharged from [facility] on 03/30/13 and says he immediately relapsed on crack. He states he didn't get his medications filled and has not taken any medication since discharge. He is homeless and says he has been sleeping outside. He has not been able to sleep but has had food to eat. He states the voices "are distant" but they have been distracting him. He says that "I didn't want help last time but this time I do want help." He denies current suicidal ideation but he has a history of suicidal ideation with thoughts of walking into traffic. He denies homicidal ideation or history of violence. He denies visual hallucinations. He states he has been using approximately $100 worth of crack daily and drinking "a couple of beers." He denies other substance abuse. He states he has charges pending for drug paraphernalia and trespassing with a court date sometime in November. He says he has no support other than his cousin.  It is disheveled, very drowsy and repeatedly dozed off during the assessment. His thought process is coherent. His responses were brief. His mood his depressed and affect is flat. Insight and judgment are poor.  Axis I: 295.30 Schizophrenia; 304.20 Cocaine Dependence Axis II: Deferred Axis III:  Past Medical History  Diagnosis Date  . Diabetes mellitus without complication   . Hypertension   . Schizophrenia    Axis IV: economic problems, housing problems and problems with primary support group Axis V: GAF=35  Past Medical History:  Past Medical History  Diagnosis Date  . Diabetes mellitus without complication   . Hypertension   . Schizophrenia     No past surgical history on file.  Family History: No family history on  file.  Social History:  reports that he has been smoking Cigarettes.  He has been smoking about 0.00 packs per day. He uses smokeless tobacco. He reports that  drinks alcohol. He reports that he uses illicit drugs (Cocaine).  Additional Social History:  Alcohol / Drug Use Pain Medications: Denies abuse Prescriptions: Denies abuse Over the Counter: Denies abuse History of alcohol / drug use?: Yes Longest period of sobriety (when/how long): unknown Negative Consequences of Use: Financial;Legal;Work / School Substance #1 Name of Substance 1: alcohol 1 - Age of First Use: teens 1 - Amount (size/oz): 2-3 beers 1 - Frequency: every couple of days 1 - Duration: ongoing 1 - Last Use / Amount: 04/02/13 Substance #2 Name of Substance 2: crack cocaine 2 - Age of First Use: 24 2 - Amount (size/oz): $100 worth 2 - Frequency: daily 2 - Duration: ongoing for years 2 - Last Use / Amount: 04/04/13  CIWA: CIWA-Ar BP: 171/105 mmHg Pulse Rate: 103 COWS:    Allergies:  Allergies  Allergen Reactions  . Haldol [Haloperidol] Other (See Comments)    Muscle spasms, loss of voluntary movement    Home Medications:  (Not in a hospital admission)  OB/GYN Status:  No LMP for male patient.  General Assessment Data Location of Assessment: [facility] ED Is this a Tele or Face-to-Face Assessment?: Tele Assessment Is this an Initial Assessment or a Re-assessment for this encounter?: Initial Assessment Living Arrangements: Other (Comment) (Homeless) Can pt return to current living arrangement?: Yes Admission Status: Voluntary Is patient  capable of signing voluntary admission?: Yes Transfer from: Acute Hospital Referral Source: Self/Family/Friend     [facility] Crisis Care Plan Living Arrangements: Other (Comment) (Homeless) Name of Psychiatrist: [name] Name of Therapist: none  Education Status Is patient currently in school?: Yes Current Grade: NA Highest grade of school patient has completed: 12 Name  of school: NA Contact person: NA  Risk to self Suicidal Ideation: No Suicidal Intent: No Is patient at risk for suicide?: No Suicidal Plan?: No Specify Current Suicidal Plan: None Access to Means: No Specify Access to Suicidal Means: None What has been your use of drugs/alcohol within the last 12 months?: Pt has been smoking crack Previous Attempts/Gestures: Yes How many times?: 3 Other Self Harm Risks: NA Triggers for Past Attempts: Unknown Intentional Self Injurious Behavior: None Family Suicide History: No Recent stressful life event(s): Financial Problems;Other (Comment) (Homeless) Persecutory voices/beliefs?: Yes Depression: Yes Depression Symptoms: Despondent;Insomnia;Loss of interest in usual pleasures Substance abuse history and/or treatment for substance abuse?: Yes Suicide prevention information given to non-admitted patients: Not applicable  Risk to Others Homicidal Ideation: No Thoughts of Harm to Others: No Current Homicidal Intent: No Current Homicidal Plan: No Access to Homicidal Means: No Identified Victim: NA History of harm to others?: No Assessment of Violence: None Noted Violent Behavior Description: None Does patient have access to weapons?: No Criminal Charges Pending?: Yes Describe Pending Criminal Charges: Possession of drug paraphernalia; tresspassing Does patient have a court date: Yes Court Date: 05/01/13  Psychosis Hallucinations: Auditory Delusions: None noted  Mental Status Report Appear/Hygiene: Disheveled Eye Contact: Poor Motor Activity: Unremarkable Speech: Logical/coherent Level of Consciousness: Drowsy Mood: Depressed Affect: Depressed Anxiety Level: None Thought Processes: Coherent Judgement: Unimpaired Orientation: Person;Place;Time;Situation Obsessive Compulsive Thoughts/Behaviors: None  Cognitive Functioning Concentration: Decreased Memory: Recent Intact;Remote Intact IQ: Average Insight: Poor Impulse Control:  Fair Appetite: Fair Weight Loss: 0 Weight Gain: 0 Sleep: Decreased Total Hours of Sleep: 4 Vegetative Symptoms: Decreased grooming  ADLScreening [facility] Assessment Services) Patient's cognitive ability adequate to safely complete daily activities?: Yes Patient able to express need for assistance with ADLs?: Yes Independently performs ADLs?: Yes (appropriate for developmental age)  Prior Inpatient Therapy Prior Inpatient Therapy: Yes Prior Therapy Dates: 03/2013, multiple admissions Prior Therapy Facilty/Provider(s): [facility], [facility], [facility], [facility], [facility], several unknown others Reason for Treatment: Psychosis/SI/SA  Prior Outpatient Therapy Prior Outpatient Therapy: Yes Prior Therapy Dates: Current Prior Therapy Facilty/Provider(s): Transport planner Reason for Treatment: Med mgnt  ADL Screening (condition at time of admission) Patient's cognitive ability adequate to safely complete daily activities?: Yes Is the patient deaf or have difficulty hearing?: No Does the patient have difficulty seeing, even when wearing glasses/contacts?: No Does the patient have difficulty concentrating, remembering, or making decisions?: No Patient able to express need for assistance with ADLs?: Yes Does the patient have difficulty dressing or bathing?: No Independently performs ADLs?: Yes (appropriate for developmental age) Does the patient have difficulty walking or climbing stairs?: No Weakness of Legs: None Weakness of Arms/Hands: None  Home Assistive Devices/Equipment Home Assistive Devices/Equipment: None    Abuse/Neglect Assessment (Assessment to be complete while patient is alone) Physical Abuse: Denies Verbal Abuse: Denies Sexual Abuse: Denies Exploitation of patient/patient's resources: Denies Self-Neglect: Denies Values / Beliefs Cultural Requests During Hospitalization: None Spiritual Requests During Hospitalization: None        Additional Information 1:1 In Past 12 Months?:  No CIRT Risk: No Elopement Risk: No Does patient have medical clearance?: Yes     Disposition:  Disposition Initial Assessment Completed for  this Encounter: Yes Disposition of Patient: Referred to Type of inpatient treatment program: Adult Patient referred to: Other (Comment) ([facility] at capacity. Refer to other facilities)  [facility] is currently at capacity. Consulted with Maryjean Morn, PA who recommended that placement at other facilities be sought. Discussed this recommendation with Dr. Read Drivers who agreed with plan. TTS will contact other facilities for placement.  Pamalee Leyden, [facility], [facility] Triage Specialist   Davonna Belling Cheri Kearns 04/05/2013 5:37 AM

## 2013-04-05 NOTE — ED Notes (Signed)
Pt doing telepsych at this time.  

## 2013-04-05 NOTE — Progress Notes (Signed)
Pt declined by [facility].  [name], MHT/NS

## 2013-04-05 NOTE — ED Notes (Signed)
Per EMS, pt was smoking crack tonight and has since had high BP and blood sugar following not taking his medicaitons tonight.  Pt states he is hearing voices and would like to "talk to someone at [facility]"

## 2013-04-05 NOTE — ED Notes (Signed)
Pt belongings returned to pt 

## 2013-04-05 NOTE — ED Notes (Signed)
Pt provided chocolate milk, and [place] sandwich. Pt notified since he is diabetic, he will not be allowed any more chocolate milk.

## 2013-04-05 NOTE — ED Notes (Signed)
Pt upset during discharge.  Stated "y'all don't give a sh** about me.  How are y'all going to let me go like this.  What am I suppose to do?"  This RN informed pt that he is to call [facility] in the morning as discussed with MD prior to discharge. Resource guide given to pt who verbalized understanding of ti.

## 2013-04-05 NOTE — ED Provider Notes (Signed)
CSN: [id number]     Arrival date & time 04/05/13  0115 History   First MD Initiated Contact with Patient 04/05/13 0239     Chief Complaint  Patient presents with  . Psychiatric Evaluation   (Consider location/radiation/quality/duration/timing/severity/associated sxs/prior Treatment) HPI This is a 52 year old male with a long-standing history of schizophrenia and cocaine abuse. He admits to smoking crack prior to arrival. He states that after smoking crack he thinks his blood pressure and blood sugar went up. He has not been taking his medications. His blood pressure was noted to be 177/83 on arrival with a blood sugar of 313. He denies recent alcohol use.  He further states that he has been hearing voices in the background and would like to be evaluated by someone from behavioral health. He does not know at the voices are saying.  He further says he feels generally "run down". He is also having pain in his feet consistent with his chronic neuropathy. He denies chest pain. He states he is mildly short of breath. He denies nausea, vomiting or diarrhea.  Past Medical History  Diagnosis Date  . Diabetes mellitus without complication   . Hypertension   . Schizophrenia    No past surgical history on file. No family history on file. History  Substance Use Topics  . Smoking status: Current Every Day Smoker    Types: Cigarettes  . Smokeless tobacco: Current User  . Alcohol Use: Yes    Review of Systems  All other systems reviewed and are negative.    Allergies  Haldol  Home Medications   Current Outpatient Rx  Name  Route  Sig  Dispense  Refill  . cloNIDine (CATAPRES) 0.1 MG tablet   Oral   Take 2 tablets (0.2 mg total) by mouth 2 (two) times daily. For hypertension.   60 tablet   0   . fluPHENAZine (PROLIXIN) 5 MG tablet   Oral   Take 1 tablet (5 mg total) by mouth daily. For  Psychosis and agitation.   30 tablet   0   . fluPHENAZine decanoate (PROLIXIN) 25 MG/ML  injection   Intramuscular   Inject 1 mL (25 mg total) into the muscle every 14 (fourteen) days. For schizophrenia   5 mL   0   . gabapentin (NEURONTIN) 100 MG capsule   Oral   Take 1 capsule (100 mg total) by mouth 2 (two) times daily. For anxiety and agitation.   60 capsule   0   . insulin glargine (LANTUS) 100 UNIT/ML injection   Subcutaneous   Inject 0.05 mLs (5 Units total) into the skin at bedtime. For hyperglycemic control.   10 mL   0   . lisinopril (PRINIVIL,ZESTRIL) 20 MG tablet   Oral   Take 1 tablet (20 mg total) by mouth daily. For hypertension.   30 tablet   0   . metFORMIN (GLUCOPHAGE) 500 MG tablet   Oral   Take 1 tablet (500 mg total) by mouth 2 (two) times daily with a meal. For glycemic control.   60 tablet   0   . traZODone (DESYREL) 50 MG tablet   Oral   Take 1 tablet (50 mg total) by mouth at bedtime as needed for sleep. For insomnia.   30 tablet   0    BP 171/105  Pulse 103  Temp(Src) 98 F (36.7 C) (Oral)  Resp 19  SpO2 93%  Physical Exam General: Well-developed, well-nourished male in no acute distress; appearance consistent  with age of record HENT: normocephalic; atraumatic Eyes: pupils equal, round and reactive to light; extraocular muscles intact; arcus senilis bilaterally Neck: supple Heart: regular rate and rhythm Lungs: clear to auscultation bilaterally Abdomen: soft; nondistended; nontender; bowel sounds present Extremities: No deformity; full range of motion; +1 edema of lower legs Neurologic: Awake, alert and oriented; motor function intact in all extremities and symmetric; no facial droop Skin: Warm and dry Psychiatric: Flat affect; auditory hallucinations    ED Course  Procedures (including critical care time)    MDM   Nursing notes and vitals signs, including pulse oximetry, reviewed.  Summary of this visit's results, reviewed by myself:  Labs:  Results for orders placed during the hospital encounter of  04/05/13 (from the past 24 hour(s))  GLUCOSE, CAPILLARY     Status: Abnormal   Collection Time    04/05/13  1:20 AM      Result Value Range   Glucose-Capillary 313 (*) 70 - 99 mg/dL  CBC     Status: Abnormal   Collection Time    04/05/13  1:38 AM      Result Value Range   WBC 7.1  4.0 - 10.5 K/uL   RBC 4.06 (*) 4.22 - 5.81 MIL/uL   Hemoglobin 12.0 (*) 13.0 - 17.0 g/dL   HCT 40.9 (*) 81.1 - 91.4 %   MCV 88.4  78.0 - 100.0 fL   MCH 29.6  26.0 - 34.0 pg   MCHC 33.4  30.0 - 36.0 g/dL   RDW 78.2  95.6 - 21.3 %   Platelets 195  150 - 400 K/uL  COMPREHENSIVE METABOLIC PANEL     Status: Abnormal   Collection Time    04/05/13  1:38 AM      Result Value Range   Sodium 137  135 - 145 mEq/L   Potassium 3.6  3.5 - 5.1 mEq/L   Chloride 98  96 - 112 mEq/L   CO2 28  19 - 32 mEq/L   Glucose, Bld 373 (*) 70 - 99 mg/dL   BUN 11  6 - 23 mg/dL   Creatinine, Ser 0.86  0.50 - 1.35 mg/dL   Calcium 9.0  8.4 - 57.8 mg/dL   Total Protein 6.8  6.0 - 8.3 g/dL   Albumin 3.5  3.5 - 5.2 g/dL   AST 18  0 - 37 U/L   ALT 30  0 - 53 U/L   Alkaline Phosphatase 112  39 - 117 U/L   Total Bilirubin 0.4  0.3 - 1.2 mg/dL   GFR calc non Af Amer >90  >90 mL/min   GFR calc Af Amer >90  >90 mL/min  ETHANOL     Status: None   Collection Time    04/05/13  1:38 AM      Result Value Range   Alcohol, Ethyl (B) <11  0 - 11 mg/dL  ACETAMINOPHEN LEVEL     Status: None   Collection Time    04/05/13  1:38 AM      Result Value Range   Acetaminophen (Tylenol), Serum <15.0  10 - 30 ug/mL  SALICYLATE LEVEL     Status: Abnormal   Collection Time    04/05/13  1:38 AM      Result Value Range   Salicylate Lvl <2.0 (*) 2.8 - 20.0 mg/dL  URINE RAPID DRUG SCREEN (HOSP PERFORMED)     Status: Abnormal   Collection Time    04/05/13  4:36 AM  Result Value Range   Opiates NONE DETECTED  NONE DETECTED   Cocaine POSITIVE (*) NONE DETECTED   Benzodiazepines NONE DETECTED  NONE DETECTED   Amphetamines NONE DETECTED  NONE  DETECTED   Tetrahydrocannabinol POSITIVE (*) NONE DETECTED   Barbiturates NONE DETECTED  NONE DETECTED        Carlisle Beers Howell Groesbeck, MD 04/05/13 [id number]

## 2013-04-05 NOTE — ED Notes (Addendum)
Pt reports that he is homeless and is requesting detox from crack cocaine. States he panhandles to get money. Pt denies any SI or HI. Pt ate his breakfast and is waiting to take a shower. He is lying in bed,sleeping, snoring respirations.

## 2013-04-05 NOTE — Progress Notes (Signed)
Underwriter initiated bed finding for inpatient treatment on pt behalf. The following hospital were faxed referral based on bed availability: 1)[facility] 3)[facility] 4)[facility] 5)[facility] 6)[facility]  Blain Pais, MHT/NS

## 2013-04-05 NOTE — ED Notes (Signed)
Pt brought two chocolate milks per request. Ordered a breakfast tray.

## 2013-04-05 NOTE — ED Notes (Signed)
Pt sleeping. 

## 2013-04-05 NOTE — BH Assessment (Addendum)
Dr. Denton Lank, EDP has met with patient feels that he is appropriate for discharge.   Writer spoke to patient via tele machine to discuss his disposition. No assessment needed as patient was assessment by Ala Dach today 04/05/2013 @ 0537.  He denies current SI, HI, and AVH's. Says that he was hearing voices upon his arrival to the ED. Sts that nursing staff gave him his meds, therefore; he no longer has AVH's and feels much better.    Patient now requesting substance abuse assistance. Writer recommended [facility] and [facility] for residential treatment. Patient does not meet criteria for detox as he is using THC and Cocaine (UDS + for both). Patient drinks (1) 40 oz beer per day; no alcohol in his UDS at this time.    Patient agreeable to participate in a residential program. Writer re-emphasized that referrals and arrangements would be coordinated/arranged for him to start a program. However, his follow up appointment may not be available today if [facility] and [facility] does not have beds available. Patient stating he did not want to be discharged to the street due to relapse potential. Asked that his cousin-Candice [phone] is called if he is to be discharged and not able to go straight into a facility. Says that Candice will assist in making housing arrangements for him until he is able to follow up.  Writer contacted [facility] [phone] and per Alcario Drought no male residential beds. Also contacted, Floydene Flock [phone] and Adline Mango a counselor will have to call back to discuss getting patient into the facility quickly (as early as tomorrow). Adline Mango took this writers name/number and says that a counselor will return my call between the hours of 1 and 2 pm.

## 2013-04-05 NOTE — [facility] Counselor (Signed)
Dr. Denton Lank has asked that patient is re-evaluated by a provider. He feels that patient is appropriate for discharge but would like recommendations from a provider. Writer contacted the on call physician-Dr. Putuval working on the adult unit. Per staff he is not available and currently with another patient. Writer then contacted Dr. Lucianne Muss to see if she is available to assess this patient. Writer left a message asking Dr. Lucianne Muss to return call and/or to see if she is available to assess patient.

## 2013-04-05 NOTE — ED Notes (Signed)
Bus pass given to pt

## 2013-04-05 NOTE — BH Assessment (Signed)
Writer has contacted [facility] multiple times today with the purpose to schedule patient a follow up appointment. During each phone call made this writer was transferred to various staff members voicemail's. Left voice mails for Shelle Iron & Donnella Bi asking him them both to return call. Writer was told that their staff would return calls between 1 & 2pm.   Called [facility] back to see if they would consider taking patient's information via fax for the next bed that becomes available in the morning. Per [facility], she sts that patient's information may not be held on to and that patient would need to call first thing in the morning 0915.  Writer will encourage patient to follow up with both facilities [facility] and [facility] this evening and tomorrow morning for a available bed.

## 2013-04-05 NOTE — BH Assessment (Signed)
Pt referred to [facility] and declined by Dr. Betti Cruz due to chronicity of illness.

## 2013-04-05 NOTE — ED Provider Notes (Signed)
Pt alert, content, nad.    Watching tv, eating. Pt has normal mood and affect.  Pt denies feeling severely depressed, he denies any thought of harm to self or others. Pt appears to have clear thought process, no confusion or delusions. No hallucinations. Pt states when using crack/cocaine he says stupid things.  Pt does not feel needs inpatient psych tx.  He does inquire re inpatient 'cocaine rehab'. I discussed w pt that there is no inpatient detox for cocaine. Psych team reassessed, and indicates pt psychiatrically stable for d/c, they did try to explore inpatient rehab options for patient - no beds currently available, they recommend pt follow as outpt. Pt appears stable for d/c, no acute psychosis or SI. Pt encourage to follow up with outpt resources and pcp closely.      Suzi Roots, MD 04/05/13 1539

## 2013-04-17 ENCOUNTER — Emergency Department (HOSPITAL_COMMUNITY)
Admission: EM | Admit: 2013-04-17 | Discharge: 2013-04-18 | Disposition: A | Discharge status: Home / Self Care | Payer: Medicare Other | Source: Home / Self Care | Attending: Emergency Medicine | Admitting: Emergency Medicine

## 2013-04-17 ENCOUNTER — Encounter (HOSPITAL_COMMUNITY): Payer: Self-pay | Admitting: Emergency Medicine

## 2013-04-17 DIAGNOSIS — F209 Schizophrenia, unspecified: Secondary | ICD-10-CM | POA: Insufficient documentation

## 2013-04-17 DIAGNOSIS — F17200 Nicotine dependence, unspecified, uncomplicated: Secondary | ICD-10-CM | POA: Insufficient documentation

## 2013-04-17 DIAGNOSIS — F1910 Other psychoactive substance abuse, uncomplicated: Secondary | ICD-10-CM

## 2013-04-17 DIAGNOSIS — Z79899 Other long term (current) drug therapy: Secondary | ICD-10-CM | POA: Insufficient documentation

## 2013-04-17 DIAGNOSIS — I10 Essential (primary) hypertension: Secondary | ICD-10-CM | POA: Insufficient documentation

## 2013-04-17 DIAGNOSIS — F1410 Cocaine abuse, uncomplicated: Secondary | ICD-10-CM | POA: Insufficient documentation

## 2013-04-17 DIAGNOSIS — Z794 Long term (current) use of insulin: Secondary | ICD-10-CM | POA: Insufficient documentation

## 2013-04-17 DIAGNOSIS — R1110 Vomiting, unspecified: Secondary | ICD-10-CM | POA: Insufficient documentation

## 2013-04-17 DIAGNOSIS — R443 Hallucinations, unspecified: Secondary | ICD-10-CM | POA: Insufficient documentation

## 2013-04-17 DIAGNOSIS — E119 Type 2 diabetes mellitus without complications: Secondary | ICD-10-CM | POA: Insufficient documentation

## 2013-04-17 LAB — ACETAMINOPHEN LEVEL: Acetaminophen (Tylenol), Serum: <15 ug/mL (ref 10–30)

## 2013-04-17 LAB — COMPREHENSIVE METABOLIC PANEL
ALT: 10 U/L (ref 0–53)
Alkaline Phosphatase: 99 U/L (ref 39–117)
BUN: 13 mg/dL (ref 6–23)
CO2: 28 mEq/L (ref 19–32)
GFR calc Af Amer: >90 mL/min (ref >90)
GFR calc non Af Amer: >90 mL/min (ref >90)
Glucose, Bld: 282 mg/dL — ABNORMAL HIGH (ref 70–99)
Potassium: 3.8 mEq/L (ref 3.5–5.1)
Sodium: 136 mEq/L (ref 135–145)
Total Bilirubin: 0.7 mg/dL (ref 0.3–1.2)
Total Protein: 7.3 g/dL (ref 6.0–8.3)

## 2013-04-17 LAB — CBC
Hemoglobin: 12.1 g/dL — ABNORMAL LOW (ref 13.0–17.0)
MCV: 88.2 fL (ref 78.0–100.0)
RBC: 4.25 MIL/uL (ref 4.22–5.81)
WBC: 6.8 10*3/uL (ref 4.0–10.5)

## 2013-04-17 LAB — RAPID URINE DRUG SCREEN, HOSP PERFORMED
Amphetamines: NOT DETECTED
Barbiturates: NOT DETECTED
Cocaine: POSITIVE — AB
Tetrahydrocannabinol: NOT DETECTED

## 2013-04-17 LAB — ETHANOL: Alcohol, Ethyl (B): <11 mg/dL (ref 0–11)

## 2013-04-17 MED ORDER — LORAZEPAM 1 MG PO TABS: 1.0000 mg | ORAL_TABLET | Freq: Three times a day (TID) | ORAL | Status: DC | PRN

## 2013-04-17 MED ORDER — ONDANSETRON HCL 4 MG PO TABS: 4.0000 mg | ORAL_TABLET | Freq: Three times a day (TID) | ORAL | Status: DC | PRN

## 2013-04-17 MED ORDER — NICOTINE 21 MG/24HR TD PT24: 21.0000 mg | MEDICATED_PATCH | Freq: Every day | TRANSDERMAL | Status: DC

## 2013-04-17 MED ORDER — ALUM & MAG HYDROXIDE-SIMETH 200-200-20 MG/5ML PO SUSP: 30.0000 mL | ORAL | Status: DC | PRN

## 2013-04-17 MED ORDER — ZOLPIDEM TARTRATE 5 MG PO TABS: 5.0000 mg | ORAL_TABLET | Freq: Every evening | ORAL | Status: DC | PRN

## 2013-04-17 MED ORDER — ACETAMINOPHEN 325 MG PO TABS
650.0000 mg | ORAL_TABLET | ORAL | Status: DC | PRN
  Administered 2013-04-18: 650 mg via ORAL
  Filled 2013-04-17: qty 2

## 2013-04-17 NOTE — ED Notes (Signed)
Pt arrived to the ED by [employer] with a need for medical clearance.  Pt was arrested for panhandling but [employer] assessed his situation has a behavioral diagnosis for which he is prescribed medication but has been non compliant with said.  Pt has been using crack cocaine.  Pt is also a diabetic who has sores on his feet that have been untended to.

## 2013-04-17 NOTE — ED Provider Notes (Signed)
CSN: [id number]     Arrival date & time 04/17/13  2132 History  This chart was scribed for non-physician practitioner [name], PA-C  working with Rolan Bucco, MD by Leone Payor, ED Scribe. This patient was seen in room WTR2/WLPT2 and the patient's care was started at 2132.    Chief Complaint  Patient presents with  . Medical Clearance  . Foot Pain    The history is provided by the patient. No language interpreter was used.    HPI Comments: [name] is a 52 y.o. Male with past medical history of DM, schizophrenia who presents to the Emergency Department requesting medical clearance today. Pt was brought in by [employer]. Pt states he has been off his psych medications for 1 month. He reports being a regular crack cocaine user and he last smoked $200 worth today. He had some vodka today as well. Pt states he has a history of schizophrenia, depression, anxiety. Pt is a diabetic, he takes insulin but has not taken any today. He reports last behavioral health hospitalization was 3 weeks ago. He reports misplacing the prescriptions for the medications he was prescribed at that time. He had an episode of vomiting yesterday. He also reports having some auditory and visual hallucinations. He denies SI, HI, chest pain, SOB, abdominal pain.   Past Medical History  Diagnosis Date  . Diabetes mellitus without complication   . Hypertension   . Schizophrenia    History reviewed. No pertinent past surgical history. History reviewed. No pertinent family history. History  Substance Use Topics  . Smoking status: Current Every Day Smoker    Types: Cigarettes  . Smokeless tobacco: Current User  . Alcohol Use: Yes    Review of Systems  Respiratory: Negative for shortness of breath.   Cardiovascular: Negative for chest pain.  Gastrointestinal: Positive for vomiting. Negative for abdominal pain.  Psychiatric/Behavioral: Positive for hallucinations. Negative for suicidal ideas and self-injury.   All other systems reviewed and are negative.    Allergies  Haldol  Home Medications   Current Outpatient Rx  Name  Route  Sig  Dispense  Refill  . cloNIDine (CATAPRES) 0.1 MG tablet   Oral   Take 2 tablets (0.2 mg total) by mouth 2 (two) times daily. For hypertension.   60 tablet   0   . fluPHENAZine (PROLIXIN) 5 MG tablet   Oral   Take 1 tablet (5 mg total) by mouth daily. For  Psychosis and agitation.   30 tablet   0   . fluPHENAZine decanoate (PROLIXIN) 25 MG/ML injection   Intramuscular   Inject 1 mL (25 mg total) into the muscle every 14 (fourteen) days. For schizophrenia   5 mL   0   . gabapentin (NEURONTIN) 100 MG capsule   Oral   Take 1 capsule (100 mg total) by mouth 2 (two) times daily. For anxiety and agitation.   60 capsule   0   . insulin glargine (LANTUS) 100 UNIT/ML injection   Subcutaneous   Inject 0.05 mLs (5 Units total) into the skin at bedtime. For hyperglycemic control.   10 mL   0   . lisinopril (PRINIVIL,ZESTRIL) 20 MG tablet   Oral   Take 1 tablet (20 mg total) by mouth daily. For hypertension.   30 tablet   0   . metFORMIN (GLUCOPHAGE) 500 MG tablet   Oral   Take 1 tablet (500 mg total) by mouth 2 (two) times daily with a meal. For glycemic  control.   60 tablet   0   . traZODone (DESYREL) 50 MG tablet   Oral   Take 1 tablet (50 mg total) by mouth at bedtime as needed for sleep. For insomnia.   30 tablet   0    BP 167/87  Pulse 86  Temp(Src) 98.6 F (37 C)  SpO2 98%  Physical Exam  Nursing note and vitals reviewed. Constitutional: He is oriented to person, place, and time. He appears well-developed and well-nourished. No distress.  Patient calm and in NAD. Answers questions appropriately.  HENT:  Head: Normocephalic and atraumatic.  Eyes: Conjunctivae and EOM are normal. Pupils are equal, round, and reactive to light. No scleral icterus.  Neck: Normal range of motion.  Cardiovascular: Normal rate, regular rhythm and  normal heart sounds.   Pulses:      Dorsalis pedis pulses are 2+ on the right side, and 2+ on the left side.       Posterior tibial pulses are 2+ on the right side, and 2+ on the left side.  Pulmonary/Chest: Effort normal and breath sounds normal. No respiratory distress. He has no wheezes. He has no rales.  Abdominal: Soft. He exhibits no distension. There is no tenderness.  Musculoskeletal: Normal range of motion.  Neurological: He is alert and oriented to person, place, and time. No sensory deficit. GCS eye subscore is 4. GCS verbal subscore is 5. GCS motor subscore is 6.  Skin: Skin is warm and dry. No rash noted. He is not diaphoretic. No erythema. No pallor.  Extensive calluses to b/l soles of feet. No ulcerations, erythema, swelling, or purulent drainage. No TTP elicited.  Psychiatric: He has a normal mood and affect. His behavior is normal.    ED Course  Procedures   DIAGNOSTIC STUDIES: Oxygen Saturation is 98% on RA, normal by my interpretation.    COORDINATION OF CARE: 11:37 PM Discussed treatment plan with pt at bedside and pt agreed to plan.   Labs Review Labs Reviewed  CBC - Abnormal; Notable for the following:    Hemoglobin 12.1 (*)    HCT 37.5 (*)    All other components within normal limits  COMPREHENSIVE METABOLIC PANEL - Abnormal; Notable for the following:    Glucose, Bld 282 (*)    All other components within normal limits  SALICYLATE LEVEL - Abnormal; Notable for the following:    Salicylate Lvl <2.0 (*)    All other components within normal limits  URINE RAPID DRUG SCREEN (HOSP PERFORMED) - Abnormal; Notable for the following:    Cocaine POSITIVE (*)    All other components within normal limits  ACETAMINOPHEN LEVEL  ETHANOL   Imaging Review No results found.  EKG Interpretation   None       MDM   1. Schizophrenia   2. Drug abuse    Patient is a history of schizophrenia and diabetes mellitus presents for medical clearance. Patient states that  he has been off of all of his psychiatric medications for the past month. He also endorses smoking crack today and drinking two glass of vodka. Also c/o of pain to soles of feet b/l. Patient neurovascularly intact. Feet examined without ulcerations; soles with extensive calluses secondary to walking barefoot as patient is homeless. No other pain complaints. Patient hyperglycemic, but without evidence of metabolic acidosis. UDS positive for cocaine. Labs otherwise stable and patient hemodynamically stable. Patient medically cleared and consult to TTS placed.  Patient accepted for inpatient tx at [facility] EMTALA completed  and patient stable for transfer.  Filed Vitals:   04/17/13 2153  BP: 167/87  Pulse: 86  Temp: 98.6 F (37 C)  SpO2: 98%    I personally performed the services described in this documentation, which was scribed in my presence. The recorded information has been reviewed and is accurate.    [name], PA-C 04/18/13 [phone]

## 2013-04-18 ENCOUNTER — Encounter (HOSPITAL_COMMUNITY): Payer: Self-pay | Admitting: *Deleted

## 2013-04-18 ENCOUNTER — Inpatient Hospital Stay (HOSPITAL_COMMUNITY)
Admission: AD | Admit: 2013-04-18 | Discharge: 2013-04-23 | DRG: 885 | Disposition: A | Discharge status: Home / Self Care | Payer: Medicare Other | Source: Intra-hospital | Attending: Psychiatry | Admitting: Psychiatry

## 2013-04-18 DIAGNOSIS — F200 Paranoid schizophrenia: Principal | ICD-10-CM | POA: Diagnosis present

## 2013-04-18 DIAGNOSIS — F1414 Cocaine abuse with cocaine-induced mood disorder: Secondary | ICD-10-CM

## 2013-04-18 DIAGNOSIS — F1410 Cocaine abuse, uncomplicated: Secondary | ICD-10-CM | POA: Diagnosis present

## 2013-04-18 DIAGNOSIS — F209 Schizophrenia, unspecified: Secondary | ICD-10-CM | POA: Diagnosis present

## 2013-04-18 DIAGNOSIS — I10 Essential (primary) hypertension: Secondary | ICD-10-CM

## 2013-04-18 DIAGNOSIS — Z79899 Other long term (current) drug therapy: Secondary | ICD-10-CM

## 2013-04-18 DIAGNOSIS — F1910 Other psychoactive substance abuse, uncomplicated: Secondary | ICD-10-CM | POA: Diagnosis present

## 2013-04-18 DIAGNOSIS — E119 Type 2 diabetes mellitus without complications: Secondary | ICD-10-CM

## 2013-04-18 LAB — GLUCOSE, CAPILLARY
Glucose-Capillary: 264 mg/dL — ABNORMAL HIGH (ref 70–99)
Glucose-Capillary: 214 mg/dL — ABNORMAL HIGH (ref 70–99)
Glucose-Capillary: 255 mg/dL — ABNORMAL HIGH (ref 70–99)

## 2013-04-18 MED ORDER — ACETAMINOPHEN 325 MG PO TABS: 650.0000 mg | ORAL_TABLET | ORAL | Status: DC | PRN

## 2013-04-18 MED ORDER — FLUPHENAZINE HCL 5 MG PO TABS
5.0000 mg | ORAL_TABLET | Freq: Every day | ORAL | Status: DC
  Administered 2013-04-18 – 2013-04-23 (×7): 5 mg via ORAL
  Filled 2013-04-18 (×7): qty 1
  Filled 2013-04-18: qty 7
  Filled 2013-04-18: qty 1

## 2013-04-18 MED ORDER — INSULIN GLARGINE 100 UNIT/ML ~~LOC~~ SOLN
5.0000 [IU] | Freq: Every day | SUBCUTANEOUS | Status: DC
  Administered 2013-04-18 – 2013-04-21 (×4): 5 [IU] via SUBCUTANEOUS

## 2013-04-18 MED ORDER — FLUPHENAZINE DECANOATE 25 MG/ML IJ SOLN
25.0000 mg | INTRAMUSCULAR | Status: DC
  Administered 2013-04-18: 25 mg via INTRAMUSCULAR
  Filled 2013-04-18: qty 1

## 2013-04-18 MED ORDER — ALUM & MAG HYDROXIDE-SIMETH 200-200-20 MG/5ML PO SUSP: 30.0000 mL | ORAL | Status: DC | PRN

## 2013-04-18 MED ORDER — MAGNESIUM HYDROXIDE 400 MG/5ML PO SUSP: 30.0000 mL | Freq: Every day | ORAL | Status: DC | PRN

## 2013-04-18 MED ORDER — TRAZODONE HCL 50 MG PO TABS: 50.0000 mg | ORAL_TABLET | Freq: Every evening | ORAL | Status: DC | PRN

## 2013-04-18 MED ORDER — METFORMIN HCL 500 MG PO TABS
500.0000 mg | ORAL_TABLET | Freq: Two times a day (BID) | ORAL | Status: DC
  Administered 2013-04-18 – 2013-04-23 (×11): 500 mg via ORAL
  Filled 2013-04-18 (×4): qty 1
  Filled 2013-04-18: qty 14
  Filled 2013-04-18 (×9): qty 1
  Filled 2013-04-18: qty 14

## 2013-04-18 MED ORDER — CLONIDINE HCL 0.2 MG PO TABS
0.2000 mg | ORAL_TABLET | Freq: Two times a day (BID) | ORAL | Status: DC
  Administered 2013-04-18 – 2013-04-23 (×11): 0.2 mg via ORAL
  Filled 2013-04-18 (×3): qty 1
  Filled 2013-04-18: qty 2
  Filled 2013-04-18 (×2): qty 1
  Filled 2013-04-18: qty 14
  Filled 2013-04-18 (×8): qty 1
  Filled 2013-04-18: qty 14

## 2013-04-18 MED ORDER — ONDANSETRON HCL 4 MG PO TABS: 4.0000 mg | ORAL_TABLET | Freq: Three times a day (TID) | ORAL | Status: DC | PRN

## 2013-04-18 MED ORDER — NICOTINE 21 MG/24HR TD PT24
21.0000 mg | MEDICATED_PATCH | Freq: Every day | TRANSDERMAL | Status: DC
  Administered 2013-04-20: 21 mg via TRANSDERMAL
  Filled 2013-04-18 (×8): qty 1

## 2013-04-18 MED ORDER — INSULIN ASPART 100 UNIT/ML ~~LOC~~ SOLN
4.0000 [IU] | Freq: Three times a day (TID) | SUBCUTANEOUS | Status: DC
  Administered 2013-04-18 – 2013-04-22 (×12): 4 [IU] via SUBCUTANEOUS

## 2013-04-18 MED ORDER — ACETAMINOPHEN 325 MG PO TABS
650.0000 mg | ORAL_TABLET | Freq: Four times a day (QID) | ORAL | Status: DC | PRN
  Administered 2013-04-18: 650 mg via ORAL
  Filled 2013-04-18: qty 2

## 2013-04-18 MED ORDER — LISINOPRIL 20 MG PO TABS
20.0000 mg | ORAL_TABLET | Freq: Every day | ORAL | Status: DC
  Administered 2013-04-18 – 2013-04-20 (×3): 20 mg via ORAL
  Filled 2013-04-18 (×5): qty 1

## 2013-04-18 MED ORDER — ZOLPIDEM TARTRATE 5 MG PO TABS
5.0000 mg | ORAL_TABLET | Freq: Every evening | ORAL | Status: DC | PRN
  Filled 2013-04-18: qty 1

## 2013-04-18 MED ORDER — GABAPENTIN 100 MG PO CAPS
100.0000 mg | ORAL_CAPSULE | Freq: Two times a day (BID) | ORAL | Status: DC
  Administered 2013-04-18 – 2013-04-23 (×11): 100 mg via ORAL
  Filled 2013-04-18 (×4): qty 1
  Filled 2013-04-18: qty 14
  Filled 2013-04-18 (×9): qty 1
  Filled 2013-04-18: qty 14

## 2013-04-18 MED ORDER — LORAZEPAM 1 MG PO TABS
1.0000 mg | ORAL_TABLET | Freq: Three times a day (TID) | ORAL | Status: DC | PRN
  Administered 2013-04-22: 1 mg via ORAL
  Filled 2013-04-18: qty 1

## 2013-04-18 MED ORDER — INSULIN ASPART 100 UNIT/ML ~~LOC~~ SOLN
0.0000 [IU] | Freq: Three times a day (TID) | SUBCUTANEOUS | Status: DC
  Administered 2013-04-18: 8 [IU] via SUBCUTANEOUS
  Administered 2013-04-18 – 2013-04-19 (×3): 5 [IU] via SUBCUTANEOUS
  Administered 2013-04-19: 11 [IU] via SUBCUTANEOUS
  Administered 2013-04-20 (×2): 8 [IU] via SUBCUTANEOUS
  Administered 2013-04-20: 5 [IU] via SUBCUTANEOUS
  Administered 2013-04-21: 8 [IU] via SUBCUTANEOUS
  Administered 2013-04-21: 15 [IU] via SUBCUTANEOUS
  Administered 2013-04-21: 3 [IU] via SUBCUTANEOUS
  Administered 2013-04-22 (×3): 8 [IU] via SUBCUTANEOUS
  Administered 2013-04-23: 5 [IU] via SUBCUTANEOUS
  Administered 2013-04-23: 11 [IU] via SUBCUTANEOUS
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
## 2013-04-18 NOTE — Progress Notes (Signed)
[facility] Group Notes:  (Nursing/MHT/Case Management/Adjunct)  Date:  04/18/2013  Time:  2000  Type of Therapy:  Psychoeducational Skills  Participation Level:  Minimal  Participation Quality:  Drowsy  Affect:  Flat  Cognitive:  Appropriate  Insight:  Improving  Engagement in Group:  Improving  Modes of Intervention:  Education  Summary of Progress/Problems: The patient slept for nearly all of the group this evening and had to be woken up. He states that he had a "pretty good" day since he was able to rest. His goal for tomorrow is to get up and attend more groups than he did today. As a theme for the day, he shared that his cousin is his support system.   Lunette Tapp S 04/18/2013, 10:05 PM

## 2013-04-18 NOTE — ED Provider Notes (Signed)
Medical screening examination/treatment/procedure(s) were performed by non-physician practitioner and as supervising physician I was immediately available for consultation/collaboration.   Shaquila Sigman, MD 04/18/13 0344 

## 2013-04-18 NOTE — Progress Notes (Signed)
Patient ID: [name], male   DOB: 1960/08/27, 52 y.o.   MRN: [id number] D)  This 52 yo AAM was here at [facility] roughly 3 wks ago.  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  He states he has been off his meds since he left here because he couldn't afford them.  He was able to afford cocaine, smoking approx. $100 worth/day and 2-3 beers/day.  He is homeless, living outside, his only support is his cousin.  He had his eyes closed during the admission and began saying he wasn't going to answer any more questions.  Did admit to hearing voices, when asked if he could understand what they were saying, he said they would say anything he wanted them to say, also stated he sees angels and demons now and then.  Was very hungry and was given a salad, was ready for bed after he had eaten.  Orders for home meds were obtained and was given prolixin 5 mg and clonidine 2 mg. Po. Med hx includes diabetes and HTN.  Hasn't been monitoring his blood sugar levels or BP.  Denies surgical hx.  Denies SI/HI on admission, contracts for safety. A)  Will continue to monitor for safety, continue POC R)  Safety maintained.

## 2013-04-18 NOTE — [facility] Group Notes (Signed)
[facility] Group Notes: (Clinical Social Work)   04/18/2013      Type of Therapy:  Group Therapy   Participation Level:  Did Not Attend    Ambrose Mantle, LCSW 04/18/2013, 12:48 PM

## 2013-04-18 NOTE — BH Assessment (Signed)
Tele Assessment Note   [name] is an 52 y.o. male, single, African-American who was brought to [facility] by Patent examiner after he was arrested for panhandling and running in traffic. Pt has a history of schizoaffective disorder and substance abuse. He states he has been off medications since he was discharged from [facility] approximately one month ago because he could not afford them. Pt states that he was running in the street today "chasing crack." He reports he smokes approximately $100 worth of crack every day, drinks 2-3 beers per day and smokes marijuana "every blue moon." He denies any other substance abuse. He reports suicidal ideation with thoughts of running into traffic but denies current intent. He has a history of diabetes and hypertension and has not been taking his medications or monitoring his glucose. He reports feeling depressed and reports symptoms including crying spells, decreased sleep and feelings of hopelessness and worthlessness. He denies homicidal ideations or history of violence. He reports hearing "a demon" but denies command hallucinations. He denies visual hallucinations.  Pt has numerous stressors. He is homeless and has been staying outside. He states he has no support other than his cousin. He is spending his monthly disability check on drugs and is not properly caring for his needs. Pt reports his feet hurt. He states he has been charged with panhandling and has a court date in November. Pt has a history of multiple admission to various facilities. His last hospitalization was at [facility] and he is normally followed by [facility] for outpatient medication management.  Pt is disheveled, alert, oriented x4 with normal speech and normal motor behavior. Thought process is coherent and relevant. Pt's insight and judgment are both poor. His mood is depressed and anxious and affect is congruent with mood. Pt was polite and cooperative throughout assessment and is  asking for inpatient treatment.   Axis I: 295.70 Schizoaffective Disorder; 304.20 Cocaine Dependence Axis II: Deferred Axis III:  Past Medical History  Diagnosis Date  . Diabetes mellitus without complication   . Hypertension   . Schizophrenia    Axis IV: economic problems, housing problems, problems related to legal system/crime and problems with primary support group Axis V: GAF=25  Past Medical History:  Past Medical History  Diagnosis Date  . Diabetes mellitus without complication   . Hypertension   . Schizophrenia     History reviewed. No pertinent past surgical history.  Family History: History reviewed. No pertinent family history.  Social History:  reports that he has been smoking Cigarettes.  He has been smoking about 0.00 packs per day. He uses smokeless tobacco. He reports that he drinks alcohol. He reports that he uses illicit drugs (Cocaine).  Additional Social History:  Alcohol / Drug Use Pain Medications: Denies abuse Prescriptions: Denies abuse Over the Counter: Denies abuse History of alcohol / drug use?: Yes Longest period of sobriety (when/how long): unknown Negative Consequences of Use: Financial;Legal;Work / School Substance #1 Name of Substance 1: alcohol 1 - Age of First Use: teens 1 - Amount (size/oz): 2-3 beers 1 - Frequency: 1-2 times per week 1 - Duration: ongoing 1 - Last Use / Amount: 04/17/13 Substance #2 Name of Substance 2: crack cocaine 2 - Age of First Use: 24 2 - Amount (size/oz): Approximately $100 worth 2 - Frequency: daily 2 - Duration: ongoing for years 2 - Last Use / Amount: 04/04/13  CIWA: CIWA-Ar BP: 167/87 mmHg Pulse Rate: 86 COWS:    Allergies:  Allergies  Allergen Reactions  . Haldol [Haloperidol] Other (See Comments)    Muscle spasms, loss of voluntary movement    Home Medications:  (Not in a hospital admission)  OB/GYN Status:  No LMP for male patient.  General Assessment Data Location of Assessment:  [facility] ED Is this a Tele or Face-to-Face Assessment?: Tele Assessment Is this an Initial Assessment or a Re-assessment for this encounter?: Initial Assessment Living Arrangements: Other (Comment) (Homeless) Can pt return to current living arrangement?: Yes Admission Status: Voluntary Is patient capable of signing voluntary admission?: Yes Transfer from: Other (Comment) Referral Source: Other ([employer])     [facility] Crisis Care Plan Living Arrangements: Other (Comment) (Homeless) Name of Psychiatrist: [name] Name of Therapist: none  Education Status Is patient currently in school?: No Current Grade: NA Highest grade of school patient has completed: 12 Name of school: NA Contact person: NA  Risk to self Suicidal Ideation: Yes-Currently Present Suicidal Intent: No Is patient at risk for suicide?: Yes Suicidal Plan?: Yes-Currently Present Specify Current Suicidal Plan: Run into traffic Access to Means: Yes Specify Access to Suicidal Means: Running into traffic What has been your use of drugs/alcohol within the last 12 months?: Pt using crack daily Previous Attempts/Gestures: Yes How many times?: 3 Other Self Harm Risks: None Triggers for Past Attempts: Other (Comment) (Substance abuse) Intentional Self Injurious Behavior: None Family Suicide History: No Recent stressful life event(s): Financial Problems;Other (Comment) (Homeless) Persecutory voices/beliefs?: Yes Depression: Yes Depression Symptoms: Despondent;Tearfulness;Isolating;Fatigue;Guilt;Loss of interest in usual pleasures;Feeling worthless/self pity;Feeling angry/irritable Substance abuse history and/or treatment for substance abuse?: Yes Suicide prevention information given to non-admitted patients: Not applicable  Risk to Others Homicidal Ideation: No Thoughts of Harm to Others: No Current Homicidal Intent: No Current Homicidal Plan: No Access to Homicidal Means: No Identified Victim: None History of harm to others?:  No Assessment of Violence: None Noted Violent Behavior Description: None Does patient have access to weapons?: No Criminal Charges Pending?: Yes Describe Pending Criminal Charges: Panhandling Does patient have a court date: Yes Court Date: 05/01/13  Psychosis Hallucinations: Auditory (Reports hearing a demon) Delusions: Persecutory  Mental Status Report Appear/Hygiene: Disheveled Eye Contact: Good Motor Activity: Unremarkable Speech: Logical/coherent Level of Consciousness: Alert Mood: Depressed;Anxious Affect: Depressed Anxiety Level: Minimal Thought Processes: Coherent;Relevant Judgement: Impaired Orientation: Person;Place;Time;Situation Obsessive Compulsive Thoughts/Behaviors: None  Cognitive Functioning Concentration: Decreased Memory: Recent Intact;Remote Intact IQ: Average Insight: Poor Impulse Control: Poor Appetite: Fair Weight Loss: 5 Weight Gain: 0 Sleep: Decreased Total Hours of Sleep: 6 Vegetative Symptoms: Decreased grooming  ADLScreening [facility] Assessment [facility]) Patient's cognitive ability adequate to safely complete daily activities?: Yes Patient able to express need for assistance with ADLs?: Yes Independently performs ADLs?: Yes (appropriate for developmental age)  Prior Inpatient Therapy Prior Inpatient Therapy: Yes Prior Therapy Dates: 03/2013, multiple admissions Prior Therapy Facilty/Provider(s): [facility], [facility], [facility], [facility], [facility], several unknown others Reason for Treatment: Psychosis/SI/SA  Prior Outpatient Therapy Prior Outpatient Therapy: Yes Prior Therapy Dates: Current Prior Therapy Facilty/Provider(s): Transport planner Reason for Treatment: Med mgnt  ADL Screening (condition at time of admission) Patient's cognitive ability adequate to safely complete daily activities?: Yes Is the patient deaf or have difficulty hearing?: No Does the patient have difficulty seeing, even when wearing glasses/contacts?: No Does the patient have  difficulty concentrating, remembering, or making decisions?: No Patient able to express need for assistance with ADLs?: Yes Does the patient have difficulty dressing or bathing?: No Independently performs ADLs?: Yes (appropriate for developmental age) Does the patient have difficulty walking or climbing  stairs?: No Weakness of Legs: None Weakness of Arms/Hands: None  Home Assistive Devices/Equipment Home Assistive Devices/Equipment: None    Abuse/Neglect Assessment (Assessment to be complete while patient is alone) Physical Abuse: Denies Verbal Abuse: Denies Sexual Abuse: Denies Exploitation of patient/patient's resources: Denies Self-Neglect: Denies Values / Beliefs Cultural Requests During Hospitalization: None Spiritual Requests During Hospitalization: None   Advance Directives (For Healthcare) Advance Directive: Patient does not have advance directive;Patient would not like information Pre-existing out of facility DNR order (yellow form or pink MOST form): No    Additional Information 1:1 In Past 12 Months?: No CIRT Risk: No Elopement Risk: No Does patient have medical clearance?: Yes     Disposition:  Disposition Initial Assessment Completed for this Encounter: Yes Disposition of Patient: Inpatient treatment program Type of inpatient treatment program: Adult  Consulted with Laverle Hobby, [facility] who confirmed bed availability at [facility]. Consulted with Verne Spurr, PA who accepted Pt to the service of Dr. Cyndia Diver, room 400-2. Notified [name], PA-C and Laveda Norman, RN of acceptance.  Pamalee Leyden, [facility], [facility] Triage Specialist   Patsy Baltimore, Harlin Rain 04/18/2013 12:45 AM

## 2013-04-18 NOTE — Progress Notes (Signed)
Patient ID: [name], male   DOB: October 03, 1960, 52 y.o.   MRN:  04-18-2013 @ [id number] Nursing shift note: pt didn't go to groups, med's were given to him  in his room and he has not been visible in the milieu. He has slept all day with the exception of meals, he went to the cafeteria.  He refused to fill out a inventory sheet. A: staff continues to encourage, support and placate  this patient. R: he denies any si/hi and endorses auditory hallucinations. RN will monitor and Q 15 min ck's continue.

## 2013-04-18 NOTE — [facility] Counselor (Signed)
Adult Psychosocial Assessment Update Interdisciplinary Team  Previous [facility] admissions/discharges:  Admissions Discharges  Date:  03/26/13 Date:  03/30/13  Date:  04/17/09, 03/12/09, 12/18/08, 09/08/08, 08/17/08, 08/12/08 Date:  Date:  06/27/08, 05/31/08, 05/16/08 Date:  Date:  04/01/05, 01/05/04, 11/27/00 Date:  Date:  05/06/00, 03/05/00, 02/21/00, 02/11/00, 09/07/99 Date:   Changes since the last Psychosocial Assessment (including adherence to outpatient mental health and/or substance abuse treatment, situational issues contributing to decompensation and/or relapse). Patient did not go to his appointments at [facility] for follow-up.  He says he could not afford his medication, but has Medicare and a monthly disability check.  He has been using his check on alcohol and crack cocaine.  He is homeless.  Only his cousin is supportive.             Discharge Plan 1. Will you be returning to the same living situation after discharge?   Yes:  X No:      If no, what is your plan?    Is homeless       2. Would you like a referral for services when you are discharged? Yes:  X   If yes, for what services?  [name]  No:       Patient wants to return to [place], but has a has proven history of non-adherence to treatment.  In the past he has been treated by an ACT Team, which may be best in this case.       Summary and Recommendations (to be completed by the evaluator) This is a 52yo African American male who was hospitalized with suicidal ideation, delusions, grandiosity for stabilization.  He has been non-adherent with his psychiatric treatment.  He is homeless and lacks supports in the community.  He would benefit from safety monitoring, medication evaluation, psychoeducation, group therapy, and discharge planning to link with ongoing resources.                        Signature:  Sarina Ser, 04/18/2013 8:54 AM
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
## 2013-04-19 DIAGNOSIS — F1410 Cocaine abuse, uncomplicated: Secondary | ICD-10-CM

## 2013-04-19 DIAGNOSIS — F259 Schizoaffective disorder, unspecified: Secondary | ICD-10-CM

## 2013-04-19 LAB — GLUCOSE, CAPILLARY
Glucose-Capillary: 248 mg/dL — ABNORMAL HIGH (ref 70–99)
Glucose-Capillary: 226 mg/dL — ABNORMAL HIGH (ref 70–99)
Glucose-Capillary: 338 mg/dL — ABNORMAL HIGH (ref 70–99)

## 2013-04-19 MED ORDER — METOPROLOL TARTRATE 25 MG PO TABS: 25.0000 mg | ORAL_TABLET | Freq: Two times a day (BID) | ORAL | Status: DC

## 2013-04-19 MED ORDER — TRIAMTERENE-HCTZ 37.5-25 MG PO TABS
1.0000 | ORAL_TABLET | Freq: Every day | ORAL | Status: DC
  Administered 2013-04-19 – 2013-04-21 (×3): 1 via ORAL
  Filled 2013-04-19 (×3): qty 1

## 2013-04-19 MED ORDER — ENSURE COMPLETE PO LIQD
237.0000 mL | Freq: Every day | ORAL | Status: DC
  Administered 2013-04-19 – 2013-04-20 (×2): 237 mL via ORAL

## 2013-04-19 MED ORDER — ADULT MULTIVITAMIN W/MINERALS CH
1.0000 | ORAL_TABLET | Freq: Every day | ORAL | Status: DC
  Administered 2013-04-19 – 2013-04-23 (×5): 1 via ORAL
  Filled 2013-04-19 (×8): qty 1

## 2013-04-19 MED ORDER — HYDROCHLOROTHIAZIDE 12.5 MG PO CAPS: 12.5000 mg | ORAL_CAPSULE | Freq: Every day | ORAL | Status: DC

## 2013-04-19 NOTE — Progress Notes (Signed)
Recreation Therapy Notes  Date: 10.20.2014 Time: 9:30am Location: [address] Hall Dayroom  Group Topic: Coping Skills  Goal Area(s) Addresses:  Patient will express themselves through the use of art.  Patient will identify what positive changes have been made in their lives.   Behavioral Response: Did not attend.   Marykay Lex Carletta Feasel, LRT/CTRS  Jordy Hewins L 04/19/2013 11:38 AM

## 2013-04-19 NOTE — Progress Notes (Signed)
Patient did not attend 11am psycho educational group. Rn notified.

## 2013-04-19 NOTE — Progress Notes (Signed)
Adult Services Patient-Family Contact/Session  Attendees:    Goal(s):    Safety Concerns:    Narrative:    Barrier(s):    Interventions:  [employer], cousin with whom pt lives, [phone]  with the pt.  Her first question was whether he had requested rehab.  He assured her he had, though he had said nothing to me about it.  Told them both about [facility] and [facility].  First choice is [facility] since it's possible to go directly from here.  Second choice is [facility].  Told pt if he wants me to refer him to rehab, he must attend all groups.  Recommendation(s):    Follow-up Required:    Explanation:    [name] 04/19/2013, 3:26 PM

## 2013-04-19 NOTE — Tx Team (Signed)
  Interdisciplinary Treatment Plan Update   Date Reviewed:  04/19/2013  Time Reviewed:  8:23 AM  Progress in Treatment:   Attending groups: No Participating in groups:No Taking medication as prescribed: Yes  Tolerating medication: Yes Family/Significant other contact made: Yes  Patient understands diagnosis: Yes As evidenced by asking for help with substance abuse, psychosis Discussing patient identified problems/goals with staff: Yes  See initial care plan Medical problems stabilized or resolved: Yes Denies suicidal/homicidal ideation: Yes  In tx team Patient has not harmed self or others: Yes  For review of initial/current patient goals, please see plan of care.  Estimated Length of Stay:  4-5 days  Reason for Continuation of Hospitalization: Depression Hallucinations Medication stabilization  New Problems/Goals identified:  N/A  Discharge Plan or Barriers:   unknown  Additional Comments:  [name] 52 y.o. male that presented to [facility] via [facility] (stated he called them to transport him to ED), stating he was hearing voices, and has been off of his medication (Prolixin) for 3 weeks. Pt stated he missed his last shot and walked out last week from the ER while waiting for admission to our inpatient unit last week. Pt reports a hx of Schizophrenia. Pt denies visual hallucinations or delusions. Pt endorses sx of depression and anxiety. Pt also admits to longstanding hx of crack cocaine and alcohol use. Pt uses both substances daily, reporting an average of $100 crack per day, last use was this morniig before he arrived to [facility]. Pt reports he drinks one 40 oz beer per day, last use last night. Pt is calm, cooperative, oriented x 4, has normal speech. Pt asking for help and stated he needs to get back on his meds. Pt stated he lives with his cousin and that is his only support   Attendees:  Signature: Thedore Mins, MD 04/19/2013 8:23 AM   Signature: Richelle Ito, LCSW 04/19/2013 8:23 AM   Signature: Fransisca Kaufmann, NP 04/19/2013 8:23 AM  Signature: Joslyn Devon, RN 04/19/2013 8:23 AM  Signature: Liborio Nixon, RN 04/19/2013 8:23 AM  Signature:  04/19/2013 8:23 AM  Signature:   04/19/2013 8:23 AM  Signature:    Signature:    Signature:    Signature:    Signature:    Signature:      Scribe for Treatment Team:   Richelle Ito, LCSW  04/19/2013 8:23 AM

## 2013-04-19 NOTE — Progress Notes (Signed)
Nutrition Brief Note  Pt meets criteria for severe MALNUTRITION in the context of social/environmental circumstances as evidenced by <75% estimated energy intake with 9.6% weight loss in the past 2 months per pt report.  Patient identified on the Malnutrition Screening Tool (MST) Report.  Wt Readings from Last 10 Encounters:  04/18/13 188 lb (85.276 kg)  03/26/13 198 lb (89.812 kg)  03/26/13 200 lb (90.719 kg)   Body mass index is 28.59 kg/(m^2). Patient meets criteria for overweight based on current BMI.   Discussed intake PTA with patient and compared to intake presently.  Discussed changes in intake and encouraged adequate intake of meals and snacks. Pt homeless PTA. States his food intake daily would depend on what people brought him to eat. Reports 20 pound unintended weight loss in the past 2 months. Reports excellent appetite during admission. Interested in getting Ensure Complete at night.   Pt with long hx of crack cocaine and alcohol use typically drinking one 40 ounce beer/day.   Current diet order is regular and pt is also offered choice of unit snacks mid-morning and mid-afternoon.  Pt is eating as desired.   Labs and medications reviewed. CBGs elevated.   Nutrition Dx:  Unintended wt change r/t suboptimal oral intake AEB pt report  Interventions:   Discussed the importance of nutrition and encouraged intake of food and beverages.    Supplements: Ensure Complete HS   Multivitamin 1 tablet PO daily   No additional nutrition interventions warranted at this time. If nutrition issues arise, please consult RD.   Levon Hedger MS, RD, LDN [phone] Pager [phone] After Hours Pager

## 2013-04-19 NOTE — Progress Notes (Signed)
Patient ID: [name], male   DOB: 1960/10/28, 52 y.o.   MRN: [id number]  D: Pt. Reports no SI/HI or A/V hallucinations. Pt. reported being here at [facility] because of, "cocaine and mental health." Patient spoke to physician and stated that he was ready to leave. Pt. reported that he was experiencing auditory hallucinations previously but is no longer experiencing any. Patient refused to do his daily pt. inventory sheet and states that he is not going to groups and will be sleeping in bed all day.   A: Encouragement to participate and go to groups was given by Clinical research associate.   R: Pt. Refuses to attend group and do his daily inventory sheet. Q15 minute safety checks are maintained and patient is safe at this time. Will continue to monitor patient.

## 2013-04-19 NOTE — Progress Notes (Signed)
[facility] MD Progress Note  04/19/2013 11:35 AM [name]  MRN:  [id number] Subjective: " I came here because I stopped taking my Prolixin and started doing crack cocaine." Objective: Patient continues to report hearing voices telling him to hurt himself but has no plan to do so. He also reports craving for cocaine, feeling anxious and depressed. However, he says that his medication has been helping him since he got back on it. He has not endorsed any adverse reactions. Diagnosis:   DSM5: Schizophrenia Disorders:  Schizophrenia (295.7) Obsessive-Compulsive Disorders:   Trauma-Stressor Disorders:   Substance/Addictive Disorders:  Cocaine use disorder severe Depressive Disorders:  Major Depressive Disorder - Severe (296.23)  Axis I: Schizoaffective Disorder depressed type            Cocaine use disorder severe  ADL's:  Intact  Sleep: Fair  Appetite:  Fair  Suicidal Ideation: denies  Homicidal Ideation: denies  AEB (as evidenced by):  Psychiatric Specialty Exam: Review of Systems  Constitutional: Negative.   HENT: Negative.   Eyes: Negative.   Respiratory: Negative.   Cardiovascular: Negative.   Gastrointestinal: Negative.   Genitourinary: Negative.   Musculoskeletal: Negative.   Skin: Negative.   Neurological: Negative.   Endo/Heme/Allergies: Negative.   Psychiatric/Behavioral: Positive for depression, hallucinations and substance abuse. The patient is nervous/anxious.     Blood pressure 176/96, pulse 73, temperature 97.9 F (36.6 C), temperature source Oral, resp. rate 20, height 5\' 8"  (1.727 m), weight 85.276 kg (188 lb), SpO2 98.00%.Body mass index is 28.59 kg/(m^2).  General Appearance: Disheveled  Eye Solicitor::  Fair  Speech:  Garbled  Volume:  Increased  Mood:  Anxious and Irritable  Affect:  Blunt  Thought Process:  Circumstantial  Orientation:  Full (Time, Place, and Person)  Thought Content:  Delusions and Hallucinations: Auditory  Suicidal Thoughts:   No  Homicidal Thoughts:  No  Memory:  Immediate;   Fair Recent;   Fair Remote;   Fair  Judgement:  Poor  Insight:  Lacking  Psychomotor Activity:  Increased  Concentration:  Poor  Recall:  Fair  Akathisia:  No  Handed:  Right  AIMS (if indicated):     Assets:  Desire for Improvement Financial Resources/Insurance  Sleep:  Number of Hours: 6.5   Current Medications: Current Facility-Administered Medications  Medication Dose Route Frequency Provider Last Rate Last Dose  . acetaminophen (TYLENOL) tablet 650 mg  650 mg Oral Q4H PRN Verne Spurr, PA-C      . acetaminophen (TYLENOL) tablet 650 mg  650 mg Oral Q6H PRN Verne Spurr, PA-C   650 mg at 04/18/13 0330  . alum & mag hydroxide-simeth (MAALOX/MYLANTA) 200-200-20 MG/5ML suspension 30 mL  30 mL Oral PRN Verne Spurr, PA-C      . alum & mag hydroxide-simeth (MAALOX/MYLANTA) 200-200-20 MG/5ML suspension 30 mL  30 mL Oral Q4H PRN Verne Spurr, PA-C      . cloNIDine (CATAPRES) tablet 0.2 mg  0.2 mg Oral BID Verne Spurr, PA-C   0.2 mg at 04/19/13 0759  . fluPHENAZine (PROLIXIN) tablet 5 mg  5 mg Oral Daily Verne Spurr, PA-C   5 mg at 04/19/13 0758  . fluPHENAZine decanoate (PROLIXIN) injection 25 mg  25 mg Intramuscular Q14 Days Verne Spurr, PA-C   25 mg at 04/18/13 0939  . gabapentin (NEURONTIN) capsule 100 mg  100 mg Oral BID Verne Spurr, PA-C   100 mg at 04/19/13 0759  . insulin aspart (novoLOG) injection 0-15 Units  0-15 Units Subcutaneous TID  WC Nanine Means, NP   5 Units at 04/19/13 [phone]  . insulin aspart (novoLOG) injection 4 Units  4 Units Subcutaneous TID WC Nanine Means, NP   4 Units at 04/19/13 [phone]  . insulin glargine (LANTUS) injection 5 Units  5 Units Subcutaneous QHS Verne Spurr, PA-C   5 Units at 04/18/13 2136  . lisinopril (PRINIVIL,ZESTRIL) tablet 20 mg  20 mg Oral Daily Verne Spurr, PA-C   20 mg at 04/19/13 0759  . LORazepam (ATIVAN) tablet 1 mg  1 mg Oral Q8H PRN Verne Spurr, PA-C      . magnesium  hydroxide (MILK OF MAGNESIA) suspension 30 mL  30 mL Oral Daily PRN Verne Spurr, PA-C      . metFORMIN (GLUCOPHAGE) tablet 500 mg  500 mg Oral BID WC Verne Spurr, PA-C   500 mg at 04/19/13 0759  . nicotine (NICODERM CQ - dosed in mg/24 hours) patch 21 mg  21 mg Transdermal Daily Verne Spurr, PA-C      . ondansetron (ZOFRAN) tablet 4 mg  4 mg Oral Q8H PRN Verne Spurr, PA-C      . traZODone (DESYREL) tablet 50 mg  50 mg Oral QHS PRN Verne Spurr, PA-C      . zolpidem (AMBIEN) tablet 5 mg  5 mg Oral QHS PRN Verne Spurr, PA-C        Lab Results:  Results for orders placed during the hospital encounter of 04/18/13 (from the past 48 hour(s))  GLUCOSE, CAPILLARY     Status: Abnormal   Collection Time    04/18/13  6:28 AM      Result Value Range   Glucose-Capillary 304 (*) 70 - 99 mg/dL   Comment 1 Notify RN    GLUCOSE, CAPILLARY     Status: Abnormal   Collection Time    04/18/13 11:13 AM      Result Value Range   Glucose-Capillary 257 (*) 70 - 99 mg/dL  GLUCOSE, CAPILLARY     Status: Abnormal   Collection Time    04/18/13 11:55 AM      Result Value Range   Glucose-Capillary 264 (*) 70 - 99 mg/dL   Comment 1 Notify RN    GLUCOSE, CAPILLARY     Status: Abnormal   Collection Time    04/18/13  5:12 PM      Result Value Range   Glucose-Capillary 214 (*) 70 - 99 mg/dL  GLUCOSE, CAPILLARY     Status: Abnormal   Collection Time    04/18/13  9:04 PM      Result Value Range   Glucose-Capillary 255 (*) 70 - 99 mg/dL   Comment 1 Notify RN    GLUCOSE, CAPILLARY     Status: Abnormal   Collection Time    04/19/13  6:11 AM      Result Value Range   Glucose-Capillary 248 (*) 70 - 99 mg/dL   Comment 1 Notify RN      Physical Findings: AIMS: Facial and Oral Movements Muscles of Facial Expression: None, normal Lips and Perioral Area: None, normal Jaw: None, normal Tongue: None, normal,Extremity Movements Upper (arms, wrists, hands, fingers): None, normal Lower (legs, knees,  ankles, toes): None, normal, Trunk Movements Neck, shoulders, hips: None, normal, Overall Severity Severity of abnormal movements (highest score from questions above): None, normal Incapacitation due to abnormal movements: None, normal Patient's awareness of abnormal movements (rate only patient's report): No Awareness, Dental Status Current problems with teeth and/or dentures?: No Does patient usually wear  dentures?: No  CIWA:    COWS:     Treatment Plan Summary: Daily contact with patient to assess and evaluate symptoms and progress in treatment Medication management  Plan:1. Admit for crisis management and stabilization. 2. Medication management to reduce current symptoms to base line and improve the   patient's overall level of functioning 3. Treat health problems as indicated. 4. Develop treatment plan to decrease risk of relapse upon discharge and the need for  readmission. 5. Psycho-social education regarding relapse prevention and self care. 6. Health care follow up as needed for medical problems. 7. Restart home medications where appropriate.   Medical Decision Making Problem Points:  Established problem, stable/improving (1), Review of last therapy session (1) and Review of psycho-social stressors (1) Data Points:  Order Aims Assessment (2) Review of medication regiment & side effects (2) Review of new medications or change in dosage (2)  I certify that inpatient services furnished can reasonably be expected to improve the patient's condition.   Thedore Mins, MD 04/19/2013, 11:35 AM

## 2013-04-19 NOTE — Progress Notes (Signed)
Pt more visible this evening. Did attend group and stayed in dayroom for snack however then returned to room where he continues to sleep. Had requested ambien prn per order but pt sleeping soundly. He continues to endorse AH. Minimal interaction, forwards little information. CBG 225, no correction at hs. Lantus admin. Pt supported. Denies SI/HI/VH and remains safe. Lawrence [place]

## 2013-04-19 NOTE — [facility] Group Notes (Signed)
[facility] LCSW Aftercare Discharge Planning Group Note   04/19/2013 8:24 AM  Participation Quality:  Did not attend    [place]

## 2013-04-19 NOTE — [facility] Group Notes (Signed)
[facility] LCSW Group Therapy  04/19/2013 1:15 pm  Type of Therapy: Process Group Therapy  Participation Level:  Invited, chose not to attend  "I'll catch the next one."    Summary of Progress/Problems: Today's group addressed the issue of overcoming obstacles.  Patients were asked to identify their biggest obstacle post d/c that stands in the way of their on-going success, and then problem solve as to how to manage this.  Daryel Gerald B 04/19/2013   1:31 PM

## 2013-04-19 NOTE — Progress Notes (Signed)
Patient ID: [name], male   DOB: 1961-06-09, 52 y.o.   MRN: [id number] D: Pt. Lying in bed, but up to speak to writer and receive eight o'clock ensure. Pt. Reports "I'm a crack head and mental health patient, I come here for help, I need help" A: Writer introduced self to client and encouraged group. Writer provided emotional support commended client for coming to get help. Staff will monitor q22min for safety. R: Pt. Attends group, pt. Is safe on the unit.

## 2013-04-20 DIAGNOSIS — F1910 Other psychoactive substance abuse, uncomplicated: Secondary | ICD-10-CM

## 2013-04-20 DIAGNOSIS — F209 Schizophrenia, unspecified: Secondary | ICD-10-CM

## 2013-04-20 LAB — BASIC METABOLIC PANEL
Chloride: 96 mEq/L (ref 96–112)
GFR calc Af Amer: >90 mL/min (ref >90)
GFR calc non Af Amer: >90 mL/min (ref >90)
Glucose, Bld: 316 mg/dL — ABNORMAL HIGH (ref 70–99)
Potassium: 3.7 mEq/L (ref 3.5–5.1)
Sodium: 130 mEq/L — ABNORMAL LOW (ref 135–145)

## 2013-04-20 LAB — GLUCOSE, CAPILLARY
Glucose-Capillary: 248 mg/dL — ABNORMAL HIGH (ref 70–99)
Glucose-Capillary: 285 mg/dL — ABNORMAL HIGH (ref 70–99)
Glucose-Capillary: 277 mg/dL — ABNORMAL HIGH (ref 70–99)
Glucose-Capillary: 320 mg/dL — ABNORMAL HIGH (ref 70–99)

## 2013-04-20 MED ORDER — LISINOPRIL 10 MG PO TABS
30.0000 mg | ORAL_TABLET | Freq: Every day | ORAL | Status: DC
  Filled 2013-04-20: qty 3

## 2013-04-20 MED ORDER — TRAZODONE HCL 100 MG PO TABS
100.0000 mg | ORAL_TABLET | Freq: Every evening | ORAL | Status: DC | PRN
  Administered 2013-04-20 – 2013-04-23 (×3): 100 mg via ORAL
  Filled 2013-04-20: qty 7
  Filled 2013-04-20 (×3): qty 1

## 2013-04-20 MED ORDER — LISINOPRIL 20 MG PO TABS
40.0000 mg | ORAL_TABLET | Freq: Every day | ORAL | Status: DC
  Administered 2013-04-21 – 2013-04-23 (×3): 40 mg via ORAL
  Filled 2013-04-20 (×2): qty 2
  Filled 2013-04-20: qty 14
  Filled 2013-04-20 (×2): qty 2

## 2013-04-20 MED ORDER — LISINOPRIL 10 MG PO TABS
10.0000 mg | ORAL_TABLET | Freq: Once | ORAL | Status: AC
  Administered 2013-04-20: 10 mg via ORAL
  Filled 2013-04-20: qty 1

## 2013-04-20 MED ORDER — ISOSORBIDE MONONITRATE ER 30 MG PO TB24
30.0000 mg | ORAL_TABLET | Freq: Every day | ORAL | Status: DC
  Administered 2013-04-20 – 2013-04-23 (×4): 30 mg via ORAL
  Filled 2013-04-20: qty 1
  Filled 2013-04-20: qty 7
  Filled 2013-04-20 (×4): qty 1

## 2013-04-20 MED ORDER — CLONIDINE HCL 0.1 MG PO TABS: 0.1000 mg | ORAL_TABLET | Freq: Two times a day (BID) | ORAL | Status: DC | PRN

## 2013-04-20 NOTE — Progress Notes (Signed)
Patient ID: [name], male   DOB: 11-20-1960, 52 y.o.   MRN: [id number] Pt. Labile, argumentative about thermostat in his room, refuses to accommodate room mate by adjusting air. "He don't won't to be in this room with me and I'll go sleep in the hall." "I'm not going to sleep in the room with him" Pt. Loud and unreasonable, labile. Pt. Angered only moments before arguing about receiving more soda after just receiving a ginger ale about two hours earlier. "they been giving them to me" "you just don't want to give me nothing to drink" "Writer tried to explain to client his blood sugar and blood pressure has been abnormal and we are trying to manage both and diet is a factor. Pt. Irrational would not hear of it.  Staffed with charge nurse, Selena Batten. B. RN and decision was made to move room mate to quiet room to prevent situation from escalating.

## 2013-04-20 NOTE — Progress Notes (Signed)
Adult Psychoeducational Group Note  Date:  04/20/2013 Time:  2:48 AM  Group Topic/Focus:  Wrap-Up Group:   The focus of this group is to help patients review their daily goal of treatment and discuss progress on daily workbooks.  Participation Level:  Minimal  Participation Quality:  Appropriate  Affect:  Irritable  Cognitive:  Lacking  Insight: Limited  Engagement in Group:  Engaged  Modes of Intervention:  Support  Additional Comments:  Patient attended and participated in group tonight. He reports having a great day. He eat, sleep and attended his groups. For his wellness he would like to stay of drugs. He plans to continue long term treatment upon discharge.   Lita Mains [facility] 04/20/2013, 2:48 AM

## 2013-04-20 NOTE — Progress Notes (Signed)
Patient ID: [name], male   DOB: 03/09/1961, 52 y.o.   MRN: [id number] [facility] MD Progress Note  04/20/2013 11:32 AM [name]  MRN:  [id number] Subjective: " I am doing fine but they are still keeping me because my blood pressure is not controlled." Objective: Patient report decreased psychosis and delusional symptoms. But he continues to report having  for cocaine, feeling anxious and depressed. However, he compliant with his medications and has not endorsed any adverse reactions. His blood pressure is uncontrolled as he is not compliant with his medications outside of the hospital. Diagnosis:   DSM5: Schizophrenia Disorders:  Schizophrenia (295.7) Obsessive-Compulsive Disorders:   Trauma-Stressor Disorders:   Substance/Addictive Disorders:  Cocaine use disorder severe Depressive Disorders:  Major Depressive Disorder - Severe (296.23)  Axis I: Schizoaffective Disorder depressed type            Cocaine use disorder severe  ADL's:  Intact  Sleep: Fair  Appetite:  Fair  Suicidal Ideation: denies  Homicidal Ideation: denies  AEB (as evidenced by):  Psychiatric Specialty Exam: Review of Systems  Constitutional: Negative.   HENT: Negative.   Eyes: Negative.   Respiratory: Negative.   Cardiovascular: Negative.   Gastrointestinal: Negative.   Genitourinary: Negative.   Musculoskeletal: Negative.   Skin: Negative.   Neurological: Negative.   Endo/Heme/Allergies: Negative.   Psychiatric/Behavioral: Positive for depression, hallucinations and substance abuse. The patient is nervous/anxious.     Blood pressure 178/108, pulse 79, temperature 97.7 F (36.5 C), temperature source Oral, resp. rate 20, height 5\' 8"  (1.727 m), weight 85.276 kg (188 lb), SpO2 98.00%.Body mass index is 28.59 kg/(m^2).  General Appearance: Disheveled  Eye Solicitor::  Fair  Speech:  Garbled  Volume:  Increased  Mood:  Anxious and Irritable  Affect:  Blunt  Thought Process:   Circumstantial  Orientation:  Full (Time, Place, and Person)  Thought Content:  Delusions and Hallucinations: Auditory  Suicidal Thoughts:  No  Homicidal Thoughts:  No  Memory:  Immediate;   Fair Recent;   Fair Remote;   Fair  Judgement:  Poor  Insight:  Lacking  Psychomotor Activity:  Increased  Concentration:  Poor  Recall:  Fair  Akathisia:  No  Handed:  Right  AIMS (if indicated):     Assets:  Desire for Improvement Financial Resources/Insurance  Sleep:  Number of Hours: 5.75   Current Medications: Current Facility-Administered Medications  Medication Dose Route Frequency Provider Last Rate Last Dose  . acetaminophen (TYLENOL) tablet 650 mg  650 mg Oral Q4H PRN Verne Spurr, PA-C      . acetaminophen (TYLENOL) tablet 650 mg  650 mg Oral Q6H PRN Verne Spurr, PA-C   650 mg at 04/18/13 0330  . alum & mag hydroxide-simeth (MAALOX/MYLANTA) 200-200-20 MG/5ML suspension 30 mL  30 mL Oral PRN Verne Spurr, PA-C      . alum & mag hydroxide-simeth (MAALOX/MYLANTA) 200-200-20 MG/5ML suspension 30 mL  30 mL Oral Q4H PRN Verne Spurr, PA-C      . cloNIDine (CATAPRES) tablet 0.1 mg  0.1 mg Oral BID PRN Fransisca Kaufmann, NP      . cloNIDine (CATAPRES) tablet 0.2 mg  0.2 mg Oral BID Verne Spurr, PA-C   0.2 mg at 04/20/13 0750  . feeding supplement (ENSURE COMPLETE) (ENSURE COMPLETE) liquid 237 mL  237 mL Oral QHS Lavena Bullion, RD   237 mL at 04/19/13 2017  . fluPHENAZine (PROLIXIN) tablet 5 mg  5 mg Oral Daily Verne Spurr, PA-C  5 mg at 04/20/13 0750  . fluPHENAZine decanoate (PROLIXIN) injection 25 mg  25 mg Intramuscular Q14 Days Verne Spurr, PA-C   25 mg at 04/18/13 0939  . gabapentin (NEURONTIN) capsule 100 mg  100 mg Oral BID Verne Spurr, PA-C   100 mg at 04/20/13 0750  . insulin aspart (novoLOG) injection 0-15 Units  0-15 Units Subcutaneous TID WC Nanine Means, NP   5 Units at 04/20/13 0655  . insulin aspart (novoLOG) injection 4 Units  4 Units Subcutaneous TID WC Nanine Means, NP   4 Units at 04/20/13 0659  . insulin glargine (LANTUS) injection 5 Units  5 Units Subcutaneous QHS Verne Spurr, PA-C   5 Units at 04/19/13 2203  . [START ON 04/21/2013] lisinopril (PRINIVIL,ZESTRIL) tablet 30 mg  30 mg Oral Daily Fransisca Kaufmann, NP      . LORazepam (ATIVAN) tablet 1 mg  1 mg Oral Q8H PRN Verne Spurr, PA-C      . magnesium hydroxide (MILK OF MAGNESIA) suspension 30 mL  30 mL Oral Daily PRN Verne Spurr, PA-C      . metFORMIN (GLUCOPHAGE) tablet 500 mg  500 mg Oral BID WC Verne Spurr, PA-C   500 mg at 04/20/13 0750  . multivitamin with minerals tablet 1 tablet  1 tablet Oral Daily Lavena Bullion, RD   1 tablet at 04/20/13 0750  . nicotine (NICODERM CQ - dosed in mg/24 hours) patch 21 mg  21 mg Transdermal Daily Verne Spurr, PA-C   21 mg at 04/20/13 0750  . ondansetron (ZOFRAN) tablet 4 mg  4 mg Oral Q8H PRN Verne Spurr, PA-C      . traZODone (DESYREL) tablet 100 mg  100 mg Oral QHS PRN Caelan Atchley      . triamterene-hydrochlorothiazide (MAXZIDE-25) 37.5-25 MG per tablet 1 tablet  1 tablet Oral Daily Kerry Hough, PA-C   1 tablet at 04/20/13 1610    Lab Results:  Results for orders placed during the hospital encounter of 04/18/13 (from the past 48 hour(s))  GLUCOSE, CAPILLARY     Status: Abnormal   Collection Time    04/18/13 11:55 AM      Result Value Range   Glucose-Capillary 264 (*) 70 - 99 mg/dL   Comment 1 Notify RN    GLUCOSE, CAPILLARY     Status: Abnormal   Collection Time    04/18/13  5:12 PM      Result Value Range   Glucose-Capillary 214 (*) 70 - 99 mg/dL  GLUCOSE, CAPILLARY     Status: Abnormal   Collection Time    04/18/13  9:04 PM      Result Value Range   Glucose-Capillary 255 (*) 70 - 99 mg/dL   Comment 1 Notify RN    GLUCOSE, CAPILLARY     Status: Abnormal   Collection Time    04/19/13  6:11 AM      Result Value Range   Glucose-Capillary 248 (*) 70 - 99 mg/dL   Comment 1 Notify RN    GLUCOSE, CAPILLARY     Status: Abnormal    Collection Time    04/19/13 12:07 PM      Result Value Range   Glucose-Capillary 226 (*) 70 - 99 mg/dL  GLUCOSE, CAPILLARY     Status: Abnormal   Collection Time    04/19/13  5:07 PM      Result Value Range   Glucose-Capillary 317 (*) 70 - 99 mg/dL   Comment 1 Documented in  Chart     Comment 2 Notify RN    GLUCOSE, CAPILLARY     Status: Abnormal   Collection Time    04/19/13  9:04 PM      Result Value Range   Glucose-Capillary 338 (*) 70 - 99 mg/dL   Comment 1 Notify RN    GLUCOSE, CAPILLARY     Status: Abnormal   Collection Time    04/20/13  6:49 AM      Result Value Range   Glucose-Capillary 248 (*) 70 - 99 mg/dL    Physical Findings: AIMS: Facial and Oral Movements Muscles of Facial Expression: None, normal Lips and Perioral Area: None, normal Jaw: None, normal Tongue: None, normal,Extremity Movements Upper (arms, wrists, hands, fingers): None, normal Lower (legs, knees, ankles, toes): None, normal, Trunk Movements Neck, shoulders, hips: None, normal, Overall Severity Severity of abnormal movements (highest score from questions above): None, normal Incapacitation due to abnormal movements: None, normal Patient's awareness of abnormal movements (rate only patient's report): No Awareness, Dental Status Current problems with teeth and/or dentures?: No Does patient usually wear dentures?: No  CIWA:  CIWA-Ar Total: 1 COWS:     Treatment Plan Summary: Daily contact with patient to assess and evaluate symptoms and progress in treatment Medication management  Plan:1. Admit for crisis management and stabilization. 2. Medication management to reduce current symptoms to base line and improve the   patient's overall level of functioning 3. Treat health problems as indicated. 4. Develop treatment plan to decrease risk of relapse upon discharge and the need for  readmission. 5. Psycho-social education regarding relapse prevention and self care. 6. Health care follow up as  needed for medical problems. 7. Restart home medications where appropriate. 8. Referral to Hospitalist for blood pressure evaluation   Medical Decision Making Problem Points:  Established problem, improving (1), Review of last therapy session (1) and Review of psycho-social stressors (1) Data Points:  Order Aims Assessment (2) Review of medication regiment & side effects (2) Review of new medications or change in dosage (2)  I certify that inpatient services furnished can reasonably be expected to improve the patient's condition.   Thedore Mins, MD 04/20/2013, 11:32 AM

## 2013-04-20 NOTE — Significant Event (Signed)
Consulted by Ms Fransisca Kaufmann, PA for psychiatry on a patient with high blood pressure, at [facility] for Cocaine abuse and withdrawal His final disposition is maybe to [facility] where he will be monitored  Ms. Earlene Plater reports that the patient has been doing well no shortness chest pain no headaches assess but sugars have been elevated on numerous attempts. His lisinopril was increased from 10 mg on 10/20 2 30  mg and he was started on Maxzide as well. Patient is also on clonidine and this was started at his regular dose of 0.2 twice a day. As patient has history of cocaine abuse and withdrawal, I would recommend against any beta-blockade at this time. I have added Isorbid mononitrate 30 mg daily and requested a.m. Lab.  I have made informal recommendations and this is not a formal consult -I have made Ms. Earlene Plater aware that if a formal consult is requested, to please call back [facility] tomorrow at [phone] and one of [place] can follow up and assist in this case   Pleas Koch, MD [facility] [phone]

## 2013-04-20 NOTE — Progress Notes (Signed)
Patient ID: [name], male   DOB: May 02, 1961, 52 y.o.   MRN: [id number] D:Patient is resting quietly in his room.  Patient has not been attending all groups, nor participating in his treatment.  Patient did indicate that he would go to rehab for his continued substance abuse.  Patient stated, "before I go to rehab, I will go home and smoke crack."  Patient was education on hypertension and the importance of staying on his blood pressure meds.  Patient indicated "my blood pressure stays that way.  My father had a heart attack in his 78s."  Patient exhibits poor insight regarding his mental and physical health.  Medical consult was ordered on patient and MD added another blood pressure medication and raised his lisinopril.  Patient's blood pressure at 1700 was 168/100.  Patient denies SI/HI, however is having auditory hallucinations.  A:Continue to monitor medication management and MD orders.  Safety checks completed every 15 minutes.  Continue to take patient's vital q4h and report changes.  R:Encourage patient to attend groups and participate.  Patient continues to sleep throughout the day, getting up at meal times.

## 2013-04-20 NOTE — [facility] Group Notes (Signed)
[facility] LCSW Group Therapy  04/20/2013 , 12:26 PM   Type of Therapy:  Group Therapy  Participation Level:  Active  Participation Quality:  Attentive  Affect:  Appropriate  Cognitive:  Alert  Insight:  Improving  Engagement in Therapy:  Engaged  Modes of Intervention:  Discussion, Exploration and Socialization  Summary of Progress/Problems: Today's group focused on the term Diagnosis.  Participants were asked to define the term, and then pronounce whether it is a negative, positive or neutral term.  [name] talked of diagnosis in terms of "a cure."  But as we talked more, and he embraced his addiction as his primary diagnosis, he changed his mind, saying that he is in a recovery process, and is not cured because he is always one step away from relapse.  [name] 04/20/2013 , 12:26 PM

## 2013-04-20 NOTE — Progress Notes (Signed)
Adult Psychoeducational Group Note  Date:  04/20/2013 Time:  8:00 pm  Group Topic/Focus:  Wrap-Up Group:   The focus of this group is to help patients review their daily goal of treatment and discuss progress on daily workbooks.  Participation Level:  Active  Participation Quality:  Appropriate and Sharing  Affect:  Appropriate and Excited  Cognitive:  Appropriate  Insight: Good  Engagement in Group:  Engaged  Modes of Intervention:  Discussion, Education, Socialization and Support  Additional Comments: Pt stated that he is in the hospital because he is addicted to crack cocaine. Pt stated that he has been at [facility] ten times before and has been in and out of jail because of his addiction problems. Pt stated that he is happy to be here and that he is committed to getting off drugs and taking full advantage of the resources available to him.   Laural Benes, Skyanne Welle 04/20/2013, 10:24 PM

## 2013-04-20 NOTE — Progress Notes (Signed)
Patient ID: [name], male   DOB: 1961/06/24, 52 y.o.   MRN: [id number] Diabetic consult ordered due to high blood sugars. Paged diabetic nurse and awaiting return call.

## 2013-04-20 NOTE — Progress Notes (Signed)
D   Pt is pleasant on approach although somewhat guarded   He reports hearing voices at times but not at the present  He is non compliant with his diabetes and will eat foods not on his diet   He said he wants to get off of drugs and try to straighten out his life  But has been unable to do it on his own    A   Verbal support given  Medications administered and effectiveness monitored    Q 15 min checks   R   Pt safe at present and resting in bed

## 2013-04-21 LAB — GLUCOSE, CAPILLARY
Glucose-Capillary: 285 mg/dL — ABNORMAL HIGH (ref 70–99)
Glucose-Capillary: 190 mg/dL — ABNORMAL HIGH (ref 70–99)
Glucose-Capillary: 368 mg/dL — ABNORMAL HIGH (ref 70–99)

## 2013-04-21 NOTE — Progress Notes (Signed)
Inpatient Diabetes Program Recommendations  AACE/ADA: New Consensus Statement on Inpatient Glycemic Control (2013)  Target Ranges:  Prepandial:   less than 140 mg/dL      Peak postprandial:   less than 180 mg/dL (1-2 hours)      Critically ill patients:  140 - 180 mg/dL   Reason for Visit: Hyperglycemia  Hx IDDM - on Lantus 5 units QHS and metformin 500 mg bid at home.   Inpatient Diabetes Program Recommendations Insulin - Basal: Increase Lantus to 10 units QHS Insulin - Meal Coverage: Increase meal coverage insulin to 6 units tidwc HgbA1C: 8.4% - uncontrolled Diet: Encourage pt to make healthy choices in the cafeteria while using portion control. Would offer other non-calorie alternatives to regular soda such as diet soda, Crystal Light and skim milk.  Note: Continue to titrate meal coverage insulin until blood sugars >180 mg/dL.  Thank you. Ailene Ards, RD, LDN, CDE Inpatient Diabetes Coordinator [phone]

## 2013-04-21 NOTE — Progress Notes (Signed)
D: Patient's affect is blunted and mood is irritable. Writer observed patient lying in bed during all morning groups. Patient verbalized that he's ready to go from here, also mentioning that his blood pressure is much lower now than when he first came to the hospital. He voiced as well, that if he's not to be discharged today he would like to sign the 72 hour request form for discharge. Patient is compliant with medication regimen. When taking his noon medication, patient apologized to Clinical research associate for his behavior earlier today.  A: Support and encouragement provided to patient. Scheduled medications administered per MD orders. Maintain Q15 minute checks for safety.  R: Patient receptive. Denies SI/HI/AVH. Patient remains safe.

## 2013-04-21 NOTE — Progress Notes (Signed)
Patient ID: [name], male   DOB: [birth year], 52 y.o.   MRN: [id number] D: Pt. Lying in bed, irritable. Pt. Reports "don't want to go to group, I went to one earlier" "and maam my blood pressure and my blood sugar stays up, it's always that way from jail to hell it stay high" then patient's conversation changed "you ought to do what I do, I give hugs" A: Writer encouraged pt. To watch diet avoid sugars and salt. Staff will monitor q28min for safety. R: Pt. Is safe on the unit. Pt. Did not attend group.

## 2013-04-21 NOTE — Progress Notes (Signed)
Patient ID: [name], male   DOB: 05-Feb-1961, 52 y.o.   MRN: [id number]  [facility] MD Progress Note  04/21/2013 10:54 AM YONAH TANGEMAN  MRN:  [id number] Subjective: Patient states "I'm doing fine. I just need a treatment center to help me with my crack addiction. I also need to leave because now my blood pressure is better." When attempting to talk with patient about improving his compliance with blood pressure and diabetic medications the patient became very agitated stating "You are not listening to me. It has been high for years and years. I just need a treatment center so they can monitor me there."   Objective:  Patient observed resting in bed upon assessment. He is initially pleasant but became agitated when questions about his medical problems were presented to him. Patient denies psychosis but nursing notes indicate he continues to hear voices and has no insight into his illness. Patient later asking to sign his 72 hour request for discharge because "My blood pressure is better now and the MD said I could go then."   Diagnosis:   DSM5: Schizophrenia Disorders:  Schizophrenia (295.7) Obsessive-Compulsive Disorders:   Trauma-Stressor Disorders:   Substance/Addictive Disorders:  Cocaine use disorder severe Depressive Disorders:  Major Depressive Disorder - Severe (296.23)  Axis I: Schizoaffective Disorder depressed type            Cocaine use disorder severe  ADL's:  Intact  Sleep: Fair  Appetite:  Fair  Suicidal Ideation: denies  Homicidal Ideation: denies  AEB (as evidenced by):  Psychiatric Specialty Exam: Review of Systems  Constitutional: Negative.   HENT: Negative.   Eyes: Negative.   Respiratory: Negative.   Cardiovascular: Negative.   Gastrointestinal: Negative.   Genitourinary: Negative.   Musculoskeletal: Negative.   Skin: Negative.   Neurological: Negative.   Endo/Heme/Allergies: Negative.   Psychiatric/Behavioral: Positive for depression,  hallucinations and substance abuse. Negative for suicidal ideas and memory loss. The patient is nervous/anxious. The patient does not have insomnia.     Blood pressure 151/89, pulse 65, temperature 98.8 F (37.1 C), temperature source Oral, resp. rate 24, height 5\' 8"  (1.727 m), weight 85.276 kg (188 lb), SpO2 98.00%.Body mass index is 28.59 kg/(m^2).  General Appearance: Disheveled  Eye Solicitor::  Fair  Speech:  Garbled  Volume:  Increased  Mood:  Anxious and Irritable  Affect:  Blunt  Thought Process:  Circumstantial  Orientation:  Full (Time, Place, and Person)  Thought Content:  Delusions and Hallucinations: Auditory  Suicidal Thoughts:  No  Homicidal Thoughts:  No  Memory:  Immediate;   Fair Recent;   Fair Remote;   Fair  Judgement:  Poor  Insight:  Lacking  Psychomotor Activity:  Increased  Concentration:  Poor  Recall:  Fair  Akathisia:  No  Handed:  Right  AIMS (if indicated):     Assets:  Desire for Improvement Financial Resources/Insurance  Sleep:  Number of Hours: 6.75   Current Medications: Current Facility-Administered Medications  Medication Dose Route Frequency Provider Last Rate Last Dose  . acetaminophen (TYLENOL) tablet 650 mg  650 mg Oral Q4H PRN Verne Spurr, PA-C      . acetaminophen (TYLENOL) tablet 650 mg  650 mg Oral Q6H PRN Verne Spurr, PA-C   650 mg at 04/18/13 0330  . alum & mag hydroxide-simeth (MAALOX/MYLANTA) 200-200-20 MG/5ML suspension 30 mL  30 mL Oral PRN Verne Spurr, PA-C      . alum & mag hydroxide-simeth (MAALOX/MYLANTA) 200-200-20 MG/5ML suspension 30 mL  30 mL Oral Q4H PRN Verne Spurr, PA-C      . cloNIDine (CATAPRES) tablet 0.2 mg  0.2 mg Oral BID Rhetta Mura, MD   0.2 mg at 04/21/13 0803  . feeding supplement (ENSURE COMPLETE) (ENSURE COMPLETE) liquid 237 mL  237 mL Oral QHS Lavena Bullion, RD   237 mL at 04/20/13 2111  . fluPHENAZine (PROLIXIN) tablet 5 mg  5 mg Oral Daily Verne Spurr, PA-C   5 mg at 04/21/13 0804  .  fluPHENAZine decanoate (PROLIXIN) injection 25 mg  25 mg Intramuscular Q14 Days Verne Spurr, PA-C   25 mg at 04/18/13 0939  . gabapentin (NEURONTIN) capsule 100 mg  100 mg Oral BID Verne Spurr, PA-C   100 mg at 04/21/13 0804  . insulin aspart (novoLOG) injection 0-15 Units  0-15 Units Subcutaneous TID WC Nanine Means, NP   8 Units at 04/21/13 [phone]  . insulin aspart (novoLOG) injection 4 Units  4 Units Subcutaneous TID WC Nanine Means, NP   4 Units at 04/21/13 [phone]  . insulin glargine (LANTUS) injection 5 Units  5 Units Subcutaneous QHS Verne Spurr, PA-C   5 Units at 04/20/13 2110  . isosorbide mononitrate (IMDUR) 24 hr tablet 30 mg  30 mg Oral Daily Rhetta Mura, MD   30 mg at 04/21/13 0805  . lisinopril (PRINIVIL,ZESTRIL) tablet 40 mg  40 mg Oral Daily Rhetta Mura, MD   40 mg at 04/21/13 0805  . LORazepam (ATIVAN) tablet 1 mg  1 mg Oral Q8H PRN Verne Spurr, PA-C      . magnesium hydroxide (MILK OF MAGNESIA) suspension 30 mL  30 mL Oral Daily PRN Verne Spurr, PA-C      . metFORMIN (GLUCOPHAGE) tablet 500 mg  500 mg Oral BID WC Verne Spurr, PA-C   500 mg at 04/21/13 0804  . multivitamin with minerals tablet 1 tablet  1 tablet Oral Daily Lavena Bullion, RD   1 tablet at 04/21/13 0804  . nicotine (NICODERM CQ - dosed in mg/24 hours) patch 21 mg  21 mg Transdermal Daily Verne Spurr, PA-C   21 mg at 04/20/13 0750  . ondansetron (ZOFRAN) tablet 4 mg  4 mg Oral Q8H PRN Verne Spurr, PA-C      . traZODone (DESYREL) tablet 100 mg  100 mg Oral QHS PRN Mojeed Akintayo   100 mg at 04/20/13 2108    Lab Results:  Results for orders placed during the hospital encounter of 04/18/13 (from the past 48 hour(s))  GLUCOSE, CAPILLARY     Status: Abnormal   Collection Time    04/19/13 12:07 PM      Result Value Range   Glucose-Capillary 226 (*) 70 - 99 mg/dL  GLUCOSE, CAPILLARY     Status: Abnormal   Collection Time    04/19/13  5:07 PM      Result Value Range   Glucose-Capillary  317 (*) 70 - 99 mg/dL   Comment 1 Documented in Chart     Comment 2 Notify RN    GLUCOSE, CAPILLARY     Status: Abnormal   Collection Time    04/19/13  9:04 PM      Result Value Range   Glucose-Capillary 338 (*) 70 - 99 mg/dL   Comment 1 Notify RN    GLUCOSE, CAPILLARY     Status: Abnormal   Collection Time    04/20/13  6:49 AM      Result Value Range   Glucose-Capillary 248 (*) 70 -  99 mg/dL  GLUCOSE, CAPILLARY     Status: Abnormal   Collection Time    04/20/13 11:57 AM      Result Value Range   Glucose-Capillary 285 (*) 70 - 99 mg/dL  GLUCOSE, CAPILLARY     Status: Abnormal   Collection Time    04/20/13  5:05 PM      Result Value Range   Glucose-Capillary 277 (*) 70 - 99 mg/dL  BASIC METABOLIC PANEL     Status: Abnormal   Collection Time    04/20/13  7:52 PM      Result Value Range   Sodium 130 (*) 135 - 145 mEq/L   Potassium 3.7  3.5 - 5.1 mEq/L   Chloride 96  96 - 112 mEq/L   CO2 26  19 - 32 mEq/L   Glucose, Bld 316 (*) 70 - 99 mg/dL   BUN 14  6 - 23 mg/dL   Creatinine, Ser 1.61  0.50 - 1.35 mg/dL   Calcium 9.5  8.4 - 09.6 mg/dL   GFR calc non Af Amer >90  >90 mL/min   GFR calc Af Amer >90  >90 mL/min   Comment: (NOTE)     The eGFR has been calculated using the CKD EPI equation.     This calculation has not been validated in all clinical situations.     eGFR's persistently <90 mL/min signify possible Chronic Kidney     Disease.     Performed at [facility]  GLUCOSE, CAPILLARY     Status: Abnormal   Collection Time    04/20/13  8:21 PM      Result Value Range   Glucose-Capillary 320 (*) 70 - 99 mg/dL  GLUCOSE, CAPILLARY     Status: Abnormal   Collection Time    04/21/13  6:37 AM      Result Value Range   Glucose-Capillary 285 (*) 70 - 99 mg/dL   Comment 1 Notify RN      Physical Findings: AIMS: Facial and Oral Movements Muscles of Facial Expression: None, normal Lips and Perioral Area: None, normal Jaw: None, normal Tongue: None,  normal,Extremity Movements Upper (arms, wrists, hands, fingers): None, normal Lower (legs, knees, ankles, toes): None, normal, Trunk Movements Neck, shoulders, hips: None, normal, Overall Severity Severity of abnormal movements (highest score from questions above): None, normal Incapacitation due to abnormal movements: None, normal Patient's awareness of abnormal movements (rate only patient's report): No Awareness, Dental Status Current problems with teeth and/or dentures?: No Does patient usually wear dentures?: No  CIWA:  CIWA-Ar Total: 1 COWS:     Treatment Plan Summary: Daily contact with patient to assess and evaluate symptoms and progress in treatment Medication management  Plan:1. Continue crisis management and stabilization. 2. Medication management to reduce current symptoms to base line and improve the  patient's overall level of functioning. Reviewed with patient who stated no untoward effects. Continue Prolixin 5 mg daily for psychosis and patient received first dose of Prolixin Dec on 04/18/13.  3. Treat health problems as indicated. Consulted Internal Medicine for for blood pressure management and the patient was started on Imdur 30 mg daily. Advised by IM to d/c maxzide due to recent drop in sodium to 130.  4. Develop treatment plan to decrease risk of relapse upon discharge and the need for  readmission. 5. Psycho-social education regarding relapse prevention and self care. 6. Health care follow up as needed for medical problems. 7. Encourage patient to fully comply with  his treatment recommendations such as following a heart healthy diet.  8. Anticipate d/c on Friday.    Medical Decision Making Problem Points:  Established problem, improving (1), Review of last therapy session (1) and Review of psycho-social stressors (1) Data Points:  Order Aims Assessment (2) Review of medication regiment & side effects (2) Review of new medications or change in dosage (2)  I  certify that inpatient services furnished can reasonably be expected to improve the patient's condition.   Fransisca Kaufmann, NP-C 04/21/2013, 10:54 AM

## 2013-04-21 NOTE — Progress Notes (Signed)
Recreation Therapy Notes  Date: 10.22.2014 Time: 9:30am Location: [address] Dayroom  Group Topic: Self-Esteem  Goal Area(s) Addresses:  Patient will effectively give examples of way to increase self-esteem. Patient will verbalize benefit of increased self-esteem.   Behavioral Response:Did not attend  [employer], LRT/CTRS  Jearl Klinefelter 04/21/2013 12:18 PM

## 2013-04-21 NOTE — Care Management Utilization Note (Signed)
   Per State Regulation 482.30  This chart was reviewed for necessity with respect to the patient's Admission/ Duration of stay.  Next review date: 04/24/13  Nicolasa Ducking RN, BSN

## 2013-04-21 NOTE — Progress Notes (Signed)
Recreation Therapy Notes  Date: 10.22.2014 Time: 9:30am Location: [address] Dayroom  Group Topic: Self-Esteem  Goal Area(s) Addresses:  Patient will effectively give examples of way to increase self-esteem. Patient will verbalize benefit of increased self-esteem.   Behavioral Response: Did not attend.   Marykay Lex Kindrick Lankford, LRT/CTRS  Jearl Klinefelter 04/21/2013 12:19 PM

## 2013-04-21 NOTE — [facility] Group Notes (Signed)
[facility] Group Therapy  04/21/2013 , 2:50 PM    Type of Therapy:  [employer] Presentation  Participation Level:  Active  Participation Quality:  Attentive  Affect:  Blunted  Cognitive:  Oriented  Insight:  Limited  Engagement in Therapy:  Engaged  Modes of Intervention:  Discussion, Education and Socialization  Summary of Progress/Problems:  [name] from [employer] came to present his recovery story and play the guitar.  [name] came to the group and sat quietly throughout.  He left about 10 minutes before group was over.  Found him in bed after group, and let him know because of his high blood sugars and high blood pressure, I am not feeling confident about his ability to get accepted at [facility].  Also let him know he has screening at [facility] on Monday, but they do not have any immediate openings.  Suggested we send an application to [facility] in [place].  [name] got upset, saying he does not want to go out of state for 90 days, and wants to be released now.  I clarified and de-escalated him, but he was still unwilling to consider a referral.  [name] 04/21/2013 , 2:50 PM

## 2013-04-21 NOTE — [facility] Group Notes (Signed)
[facility] LCSW Aftercare Discharge Planning Group Note   04/21/2013 10:11 AM  Participation Quality:  Did not attend    [name]

## 2013-04-21 NOTE — Progress Notes (Signed)
Adult Psychoeducational Group Note  Date:  04/21/2013 Time:  11:00AM Group Topic/Focus:  Personal Choices and Values:   The focus of this group is to help patients assess and explore the importance of values in their lives, how their values affect their decisions, how they express their values and what opposes their expression.  Participation Level:  Did Not Attend   Additional Comments:  Pt. Didn't attend group.   Bing Plume D 04/21/2013, 11:55 AM

## 2013-04-22 LAB — GLUCOSE, CAPILLARY: Glucose-Capillary: 295 mg/dL — ABNORMAL HIGH (ref 70–99)

## 2013-04-22 MED ORDER — INSULIN GLARGINE 100 UNIT/ML ~~LOC~~ SOLN
10.0000 [IU] | Freq: Every day | SUBCUTANEOUS | Status: DC
  Administered 2013-04-22: 10 [IU] via SUBCUTANEOUS

## 2013-04-22 MED ORDER — INSULIN ASPART 100 UNIT/ML ~~LOC~~ SOLN
6.0000 [IU] | Freq: Three times a day (TID) | SUBCUTANEOUS | Status: DC
  Administered 2013-04-22 – 2013-04-23 (×4): 6 [IU] via SUBCUTANEOUS

## 2013-04-22 NOTE — Progress Notes (Signed)
Seen and agreed. Jonni Oelkers, MD 

## 2013-04-22 NOTE — [facility] Suicide Risk Assessment (Signed)
[facility] INPATIENT:  Family/Significant Other Suicide Prevention Education  Suicide Prevention Education:  Education Completed; [name], cousin, [phone]  has been identified by the patient as the family member/significant other with whom the patient will be residing, and identified as the person(s) who will aid the patient in the event of a mental health crisis (suicidal ideations/suicide attempt).  With written consent from the patient, the family member/significant other has been provided the following suicide prevention education, prior to the and/or following the discharge of the patient.  The suicide prevention education provided includes the following:  Suicide risk factors  Suicide prevention and interventions  National Suicide Hotline telephone number  [facility] assessment telephone number  [facility] Emergency Assistance 911  [facility] and/or Residential Mobile Crisis Unit telephone number  Request made of family/significant other to:  Remove weapons (e.g., guns, rifles, knives), all items previously/currently identified as safety concern.    Remove drugs/medications (over-the-counter, prescriptions, illicit drugs), all items previously/currently identified as a safety concern.  The family member/significant other verbalizes understanding of the suicide prevention education information provided.  The family member/significant other agrees to remove the items of safety concern listed above.  [name] 04/22/2013, 11:42 AM

## 2013-04-22 NOTE — Tx Team (Signed)
  Interdisciplinary Treatment Plan Update   Date Reviewed:  04/22/2013  Time Reviewed:  11:33 AM  Progress in Treatment:   Attending groups: Yes Participating in groups: Yes Taking medication as prescribed: Yes  Tolerating medication: Yes Family/Significant other contact made: Yes  Patient understands diagnosis: Yes  Discussing patient identified problems/goals with staff: Yes Medical problems stabilized or resolved: Yes Denies suicidal/homicidal ideation: Yes Patient has not harmed self or others: Yes  For review of initial/current patient goals, please see plan of care.  Estimated Length of Stay:  D/C tomorrow  Reason for Continuation of Hospitalization:   New Problems/Goals identified:  N/A  Discharge Plan or Barriers:   return home, follow up outpt and at [facility] rehab  Additional Comments:  Attendees:  Signature: Thedore Mins, MD 04/22/2013 11:33 AM   Signature: Richelle Ito, LCSW 04/22/2013 11:33 AM  Signature: Fransisca Kaufmann, NP 04/22/2013 11:33 AM  Signature: Joslyn Devon, RN 04/22/2013 11:33 AM  Signature: Liborio Nixon, RN 04/22/2013 11:33 AM  Signature:  04/22/2013 11:33 AM  Signature:   04/22/2013 11:33 AM  Signature:    Signature:    Signature:    Signature:    Signature:    Signature:      Scribe for Treatment Team:   Richelle Ito, LCSW  04/22/2013 11:33 AM

## 2013-04-22 NOTE — Progress Notes (Signed)
D: Patient's affect is labile and mood is irritable. Declined self inventory sheet today. Patient verbalized to the MD and other health professionals that he was wanting to be d/c; he became very agitated and angry when told that it's not safe to do so at this time because his blood pressure is of concern. Patient was informed that he will be d/c on tomorrow. He's not attending groups. However, patient is interacting with peers on the unit, going to meals, and compliant with current medication regimen.  A: Support and encouragement provided to patient. Administered scheduled medications per ordering MD. Monitor Q15 minute checks for safety.  R: Patient receptive. Denies SI/HI. Patient remains safe on the unit.

## 2013-04-22 NOTE — Progress Notes (Signed)
Patient ID: [name], male   DOB: [birth year], 52 y.o.   MRN: [id number] [facility] MD Progress Note  04/22/2013 11:14 AM [name]  MRN:  [id number] Subjective: " I am doing fine and I am ready to go back home to my cousin. I don't care about my blood pressure they always discharge me with high blood pressure, I don't know why you worry so much about that." Objective: Patient denies suicidal thoughts, craving for cocaine, psychosis and delusional symptoms. He reports decreased anxiety and depressive symptoms. He compliant with his medications and has not endorsed any adverse reactions. However, his blood pressure is still  uncontrolled despite taking at least 3 anti-hypertensive medications. He is also being seeing by the hospitalist for the management of same. Diagnosis:   DSM5: Schizophrenia Disorders:  Schizophrenia (295.7) Obsessive-Compulsive Disorders:   Trauma-Stressor Disorders:   Substance/Addictive Disorders:  Cocaine use disorder severe Depressive Disorders:  Major Depressive Disorder - Severe (296.23)  Axis I: Schizoaffective Disorder depressed type            Cocaine use disorder severe  ADL's:  Intact  Sleep: Fair  Appetite:  Fair  Suicidal Ideation: denies  Homicidal Ideation: denies  AEB (as evidenced by):  Psychiatric Specialty Exam: Review of Systems  Constitutional: Negative.   HENT: Negative.   Eyes: Negative.   Respiratory: Negative.   Cardiovascular: Negative.   Gastrointestinal: Negative.   Genitourinary: Negative.   Musculoskeletal: Negative.   Skin: Negative.   Neurological: Negative.   Endo/Heme/Allergies: Negative.   Psychiatric/Behavioral: Positive for substance abuse.    Blood pressure 171/94, pulse 82, temperature 97.8 F (36.6 C), temperature source Oral, resp. rate 20, height 5\' 8"  (1.727 m), weight 85.276 kg (188 lb), SpO2 98.00%.Body mass index is 28.59 kg/(m^2).  General Appearance: fairly groomed  Patent attorney::  Fair   Speech:  clear  Volume:  Increased  Mood:   Irritable  Affect:  Full range  Thought Process:  Circumstantial  Orientation:  Full (Time, Place, and Person)  Thought Content:  Reality based  Suicidal Thoughts:  No  Homicidal Thoughts:  No  Memory:  Immediate;   Fair Recent;   Fair Remote;   Fair  Judgement:  shallow  Insight:  Lacking  Psychomotor Activity:  Increased  Concentration:  Poor  Recall:  Fair  Akathisia:  No  Handed:  Right  AIMS (if indicated):     Assets:  Desire for Improvement Financial Resources/Insurance  Sleep:  Number of Hours: 6.75   Current Medications: Current Facility-Administered Medications  Medication Dose Route Frequency Provider Last Rate Last Dose  . acetaminophen (TYLENOL) tablet 650 mg  650 mg Oral Q4H PRN Verne Spurr, PA-C      . acetaminophen (TYLENOL) tablet 650 mg  650 mg Oral Q6H PRN Verne Spurr, PA-C   650 mg at 04/18/13 0330  . alum & mag hydroxide-simeth (MAALOX/MYLANTA) 200-200-20 MG/5ML suspension 30 mL  30 mL Oral PRN Verne Spurr, PA-C      . alum & mag hydroxide-simeth (MAALOX/MYLANTA) 200-200-20 MG/5ML suspension 30 mL  30 mL Oral Q4H PRN Verne Spurr, PA-C      . cloNIDine (CATAPRES) tablet 0.2 mg  0.2 mg Oral BID Rhetta Mura, MD   0.2 mg at 04/22/13 0844  . feeding supplement (ENSURE COMPLETE) (ENSURE COMPLETE) liquid 237 mL  237 mL Oral QHS Lavena Bullion, RD   237 mL at 04/20/13 2111  . fluPHENAZine (PROLIXIN) tablet 5 mg  5 mg Oral Daily Verne Spurr,  PA-C   5 mg at 04/22/13 0845  . fluPHENAZine decanoate (PROLIXIN) injection 25 mg  25 mg Intramuscular Q14 Days Verne Spurr, PA-C   25 mg at 04/18/13 0939  . gabapentin (NEURONTIN) capsule 100 mg  100 mg Oral BID Verne Spurr, PA-C   100 mg at 04/22/13 0844  . insulin aspart (novoLOG) injection 0-15 Units  0-15 Units Subcutaneous TID WC Nanine Means, NP   8 Units at 04/22/13 0655  . insulin aspart (novoLOG) injection 6 Units  6 Units Subcutaneous TID WC Fransisca Kaufmann, NP      . insulin glargine (LANTUS) injection 10 Units  10 Units Subcutaneous QHS Fransisca Kaufmann, NP      . isosorbide mononitrate (IMDUR) 24 hr tablet 30 mg  30 mg Oral Daily Rhetta Mura, MD   30 mg at 04/22/13 0843  . lisinopril (PRINIVIL,ZESTRIL) tablet 40 mg  40 mg Oral Daily Rhetta Mura, MD   40 mg at 04/22/13 0845  . LORazepam (ATIVAN) tablet 1 mg  1 mg Oral Q8H PRN Verne Spurr, PA-C      . magnesium hydroxide (MILK OF MAGNESIA) suspension 30 mL  30 mL Oral Daily PRN Verne Spurr, PA-C      . metFORMIN (GLUCOPHAGE) tablet 500 mg  500 mg Oral BID WC Verne Spurr, PA-C   500 mg at 04/22/13 0844  . multivitamin with minerals tablet 1 tablet  1 tablet Oral Daily Lavena Bullion, RD   1 tablet at 04/22/13 0843  . nicotine (NICODERM CQ - dosed in mg/24 hours) patch 21 mg  21 mg Transdermal Daily Verne Spurr, PA-C   21 mg at 04/20/13 0750  . ondansetron (ZOFRAN) tablet 4 mg  4 mg Oral Q8H PRN Verne Spurr, PA-C      . traZODone (DESYREL) tablet 100 mg  100 mg Oral QHS PRN Keitra Carusone   100 mg at 04/21/13 2207    Lab Results:  Results for orders placed during the hospital encounter of 04/18/13 (from the past 48 hour(s))  GLUCOSE, CAPILLARY     Status: Abnormal   Collection Time    04/20/13 11:57 AM      Result Value Range   Glucose-Capillary 285 (*) 70 - 99 mg/dL  GLUCOSE, CAPILLARY     Status: Abnormal   Collection Time    04/20/13  5:05 PM      Result Value Range   Glucose-Capillary 277 (*) 70 - 99 mg/dL  BASIC METABOLIC PANEL     Status: Abnormal   Collection Time    04/20/13  7:52 PM      Result Value Range   Sodium 130 (*) 135 - 145 mEq/L   Potassium 3.7  3.5 - 5.1 mEq/L   Chloride 96  96 - 112 mEq/L   CO2 26  19 - 32 mEq/L   Glucose, Bld 316 (*) 70 - 99 mg/dL   BUN 14  6 - 23 mg/dL   Creatinine, Ser 1.61  0.50 - 1.35 mg/dL   Calcium 9.5  8.4 - 09.6 mg/dL   GFR calc non Af Amer >90  >90 mL/min   GFR calc Af Amer >90  >90 mL/min   Comment:  (NOTE)     The eGFR has been calculated using the CKD EPI equation.     This calculation has not been validated in all clinical situations.     eGFR's persistently <90 mL/min signify possible Chronic Kidney     Disease.     Performed  at [facility]  GLUCOSE, CAPILLARY     Status: Abnormal   Collection Time    04/20/13  8:21 PM      Result Value Range   Glucose-Capillary 320 (*) 70 - 99 mg/dL  GLUCOSE, CAPILLARY     Status: Abnormal   Collection Time    04/21/13  6:37 AM      Result Value Range   Glucose-Capillary 285 (*) 70 - 99 mg/dL   Comment 1 Notify RN    GLUCOSE, CAPILLARY     Status: Abnormal   Collection Time    04/21/13 11:37 AM      Result Value Range   Glucose-Capillary 190 (*) 70 - 99 mg/dL  GLUCOSE, CAPILLARY     Status: Abnormal   Collection Time    04/21/13  4:25 PM      Result Value Range   Glucose-Capillary 368 (*) 70 - 99 mg/dL  GLUCOSE, CAPILLARY     Status: Abnormal   Collection Time    04/21/13  9:31 PM      Result Value Range   Glucose-Capillary 322 (*) 70 - 99 mg/dL  GLUCOSE, CAPILLARY     Status: Abnormal   Collection Time    04/22/13  6:33 AM      Result Value Range   Glucose-Capillary 295 (*) 70 - 99 mg/dL   Comment 1 Notify RN      Physical Findings: AIMS: Facial and Oral Movements Muscles of Facial Expression: None, normal Lips and Perioral Area: None, normal Jaw: None, normal Tongue: None, normal,Extremity Movements Upper (arms, wrists, hands, fingers): None, normal Lower (legs, knees, ankles, toes): None, normal, Trunk Movements Neck, shoulders, hips: None, normal, Overall Severity Severity of abnormal movements (highest score from questions above): None, normal Incapacitation due to abnormal movements: None, normal Patient's awareness of abnormal movements (rate only patient's report): No Awareness, Dental Status Current problems with teeth and/or dentures?: No Does patient usually wear dentures?: No  CIWA:   CIWA-Ar Total: 1 COWS:     Treatment Plan Summary: Daily contact with patient to assess and evaluate symptoms and progress in treatment Medication management  Plan:1. Admit for crisis management and stabilization. 2. Medication management to reduce current symptoms to base line and improve the   patient's overall level of functioning 3. Treat health problems as indicated. 4. Develop treatment plan to decrease risk of relapse upon discharge and the need for  readmission. 5. Psycho-social education regarding relapse prevention and self care. 6. Health care follow up as needed for medical problems. 7. Restart home medications where appropriate. 8. Referral to Hospitalist for blood pressure evaluation 9. Case worker looking for referral to a rehab program upon discharge.   Medical Decision Making Problem Points:  Established problem, improving (1), Review of last therapy session (1) and Review of psycho-social stressors (1) Data Points:  Order Aims Assessment (2) Review of medication regiment & side effects (2) Review of new medications or change in dosage (2)  I certify that inpatient services furnished can reasonably be expected to improve the patient's condition.   Thedore Mins, MD 04/22/2013, 11:14 AM

## 2013-04-22 NOTE — Progress Notes (Signed)
[facility] Adult Case Management Discharge Plan :  Will you be returning to the same living situation after discharge: Yes,  home with cousin At discharge, do you have transportation home?:Yes,  cousin Do you have the ability to pay for your medications:Yes,  mental health  Release of information consent forms completed and in the chart;  Patient's signature needed at discharge.  Patient to Follow up at: Follow-up Information   Follow up with [name]. (Go to the walk-in clinic M-F between 8 and 9AM for your hospital follow up appointment)    Contact information:   [address]  [place]  [336] [phone]      Follow up with [facility] rehab On 04/26/2013. (Arrive at 8AM sharp for your screening appointment)    Contact information:   [address]  [facility]  [336] [phone]      Patient denies SI/HI:   Yes,  yes    Safety Planning and Suicide Prevention discussed:  Yes,  yes  [name] 04/22/2013, 11:41 AM

## 2013-04-22 NOTE — Progress Notes (Signed)
Patient ID: [name], male   DOB: June 07, 1961, 52 y.o.   MRN: [id number] D: pt. Lying in bed, still preoccupied with blood pressure and blood sugar "maam it was lower today, but it's always high" Pt. Expressed being happy about discharge tomorrow, denies SHI.  A: Writer encouraged pt. To get up for group and meds. R: Pt. Refused group, consented to meds when writer took them to his room.

## 2013-04-22 NOTE — [facility] Group Notes (Signed)
[facility] Group Notes:  (Counselor/Nursing/MHT/Case Management/Adjunct)  04/22/2013 1:15PM  Type of Therapy:  Group Therapy  Participation Level:  Active  Participation Quality:  Appropriate  Affect:  Flat  Cognitive:  Oriented  Insight:  Improving  Engagement in Group:  Limited  Engagement in Therapy:  Limited  Modes of Intervention:  Discussion, Exploration and Socialization  Summary of Progress/Problems: The topic for group was balance in life.  Pt participated in the discussion about when their life was in balance and out of balance and how this feels.  Pt discussed ways to get back in balance and short term goals they can work on to get where they want to be. [name] insists that he is balanced and ready to leave the hospital.  He also shared that he has thought about suicide in the past at his low points.  Others echoed this as well.  He is able to regain balance through faith, and because of the support of his cousin.   [name] B 04/22/2013 1:31 PM

## 2013-04-23 DIAGNOSIS — F1420 Cocaine dependence, uncomplicated: Secondary | ICD-10-CM

## 2013-04-23 DIAGNOSIS — F200 Paranoid schizophrenia: Secondary | ICD-10-CM

## 2013-04-23 LAB — GLUCOSE, CAPILLARY: Glucose-Capillary: 232 mg/dL — ABNORMAL HIGH (ref 70–99)

## 2013-04-23 MED ORDER — LISINOPRIL 40 MG PO TABS: 40.0000 mg | ORAL_TABLET | Freq: Every day | ORAL | Status: DC

## 2013-04-23 MED ORDER — DOCUSATE SODIUM 100 MG PO CAPS
100.0000 mg | ORAL_CAPSULE | Freq: Every day | ORAL | Status: DC
  Administered 2013-04-23: 100 mg via ORAL
  Filled 2013-04-23: qty 1
  Filled 2013-04-23 (×3): qty 7

## 2013-04-23 MED ORDER — ISOSORBIDE MONONITRATE ER 30 MG PO TB24: 30.0000 mg | ORAL_TABLET | Freq: Every day | ORAL | Status: DC

## 2013-04-23 MED ORDER — TRAZODONE HCL 100 MG PO TABS: 100.0000 mg | ORAL_TABLET | Freq: Every evening | ORAL | Status: DC | PRN

## 2013-04-23 MED ORDER — FLUPHENAZINE HCL 5 MG PO TABS: 5.0000 mg | ORAL_TABLET | Freq: Every day | ORAL | Status: DC

## 2013-04-23 MED ORDER — GABAPENTIN 100 MG PO CAPS: 100.0000 mg | ORAL_CAPSULE | Freq: Two times a day (BID) | ORAL | Status: DC

## 2013-04-23 MED ORDER — FLUPHENAZINE DECANOATE 25 MG/ML IJ SOLN: 25.0000 mg | INTRAMUSCULAR | Status: DC

## 2013-04-23 MED ORDER — MAGNESIUM CITRATE PO SOLN
1.0000 | Freq: Once | ORAL | Status: AC
  Administered 2013-04-23: 1 via ORAL

## 2013-04-23 MED ORDER — INSULIN GLARGINE 100 UNIT/ML ~~LOC~~ SOLN: 10.0000 [IU] | Freq: Every day | SUBCUTANEOUS | Status: DC

## 2013-04-23 MED ORDER — CLONIDINE HCL 0.2 MG PO TABS: 0.2000 mg | ORAL_TABLET | Freq: Two times a day (BID) | ORAL | Status: DC

## 2013-04-23 MED ORDER — METFORMIN HCL 500 MG PO TABS: 500.0000 mg | ORAL_TABLET | Freq: Two times a day (BID) | ORAL | Status: DC

## 2013-04-23 NOTE — Progress Notes (Signed)
Pt did not attend karaoke group this evening.  

## 2013-04-23 NOTE — Discharge Summary (Signed)
Physician Discharge Summary Note  Patient:  [name] is an 52 y.o., male MRN:  [id number] DOB:  Mar 09, 1961 Patient phone:  [phone] (home)  Patient address:   No address on file.   Date of Admission:  04/18/2013 Date of Discharge: 04/23/13  Discharge Diagnoses: Active Problems:   Schizophrenia   Drug abuse  Axis Diagnosis:  AXIS I: Chronic Paranoid Schizophrenia  Cocaine use disorder severe  AXIS II: Deferred  AXIS III:  Past Medical History   Diagnosis  Date   .  Diabetes mellitus without complication    .  Hypertension    .  Schizophrenia     AXIS IV: economic problems, other psychosocial or environmental problems and problems related to social environment  AXIS V: 61-70 mild symptoms  Level of Care:  OP  Hospital Course:   "[name] 52 y.o. male that presented to [facility] via [facility] (stated he called them to transport him to ED), stating he was hearing voices, and has been off of his medication (Prolixin) for 3 weeks. Pt stated he missed his last shot and walked out last week from the ER while waiting for admission to our inpatient unit last week. Pt reports a hx of Schizophrenia. Pt denies visual hallucinations or delusions. Pt endorses sx of depression and anxiety. Pt also admits to longstanding hx of crack cocaine and alcohol use. Pt uses both substances daily, reporting an average of $100 crack per day, last use was this morniig before he arrived to [facility]. Pt reports he drinks one 40 oz beer per day, last use last night. Pt is calm, cooperative, oriented x 4, has normal speech. Pt asking for help and stated he needs to get back on his meds. Pt stated he lives with his cousin and that is his only support. He is accepted to our 400 hall unit for safety and stabilization. He is alert, oriented x3 and denies SI/HI//VH." The patient reports he has not received his shot of prolixin up to this point. He reports he takes 25 mg of Prolixin once a month, and takes this with  cogentin. The patient reports he recently had been using crack cocaine and he finally called the police to take him to the hospital. He does endorse that his cousin [name] is his only support. He reports he was living with his uncle but is not able to go back to him due to his cocaine use.  While a patient in this hospital, [name] was enrolled in group counseling and activities as well as received the following medication Current facility-administered medications:acetaminophen (TYLENOL) tablet 650 mg, 650 mg, Oral, Q4H PRN, Verne Spurr, PA-C;  acetaminophen (TYLENOL) tablet 650 mg, 650 mg, Oral, Q6H PRN, Verne Spurr, PA-C, 650 mg at 04/18/13 0330;  alum & mag hydroxide-simeth (MAALOX/MYLANTA) 200-200-20 MG/5ML suspension 30 mL, 30 mL, Oral, PRN, Verne Spurr, PA-C alum & mag hydroxide-simeth (MAALOX/MYLANTA) 200-200-20 MG/5ML suspension 30 mL, 30 mL, Oral, Q4H PRN, Verne Spurr, PA-C;  cloNIDine (CATAPRES) tablet 0.2 mg, 0.2 mg, Oral, BID, Rhetta Mura, MD, 0.2 mg at 04/23/13 8295;  feeding supplement (ENSURE COMPLETE) (ENSURE COMPLETE) liquid 237 mL, 237 mL, Oral, QHS, Lavena Bullion, RD, 237 mL at 04/20/13 2111 fluPHENAZine (PROLIXIN) tablet 5 mg, 5 mg, Oral, Daily, Verne Spurr, PA-C, 5 mg at 04/23/13 6213;  fluPHENAZine decanoate (PROLIXIN) injection 25 mg, 25 mg, Intramuscular, Q14 Days, Verne Spurr, PA-C, 25 mg at 04/18/13 0939;  gabapentin (NEURONTIN) capsule 100 mg, 100 mg, Oral, BID,  Verne Spurr, PA-C, 100 mg at 04/23/13 0815 insulin aspart (novoLOG) injection 0-15 Units, 0-15 Units, Subcutaneous, TID WC, Nanine Means, NP, 5 Units at 04/23/13 [phone];  insulin aspart (novoLOG) injection 6 Units, 6 Units, Subcutaneous, TID WC, Fransisca Kaufmann, NP, 6 Units at 04/23/13 [phone];  insulin glargine (LANTUS) injection 10 Units, 10 Units, Subcutaneous, QHS, Fransisca Kaufmann, NP, 10 Units at 04/22/13 2238 isosorbide mononitrate (IMDUR) 24 hr tablet 30 mg, 30 mg, Oral, Daily,  Rhetta Mura, MD, 30 mg at 04/23/13 0817;  lisinopril (PRINIVIL,ZESTRIL) tablet 40 mg, 40 mg, Oral, Daily, Rhetta Mura, MD, 40 mg at 04/23/13 0817;  LORazepam (ATIVAN) tablet 1 mg, 1 mg, Oral, Q8H PRN, Verne Spurr, PA-C, 1 mg at 04/22/13 1144;  magnesium hydroxide (MILK OF MAGNESIA) suspension 30 mL, 30 mL, Oral, Daily PRN, Verne Spurr, PA-C metFORMIN (GLUCOPHAGE) tablet 500 mg, 500 mg, Oral, BID WC, Verne Spurr, PA-C, 500 mg at 04/23/13 5409;  multivitamin with minerals tablet 1 tablet, 1 tablet, Oral, Daily, Lavena Bullion, RD, 1 tablet at 04/23/13 0816;  nicotine (NICODERM CQ - dosed in mg/24 hours) patch 21 mg, 21 mg, Transdermal, Daily, Verne Spurr, PA-C, 21 mg at 04/20/13 0750;  ondansetron (ZOFRAN) tablet 4 mg, 4 mg, Oral, Q8H PRN, Verne Spurr, PA-C traZODone (DESYREL) tablet 100 mg, 100 mg, Oral, QHS PRN, Mojeed Akintayo, 100 mg at 04/23/13 0118  The patient was restarted on his medications of Prolixin 5 mg daily for psychosis and also received his injection Prolixin on 04/18/13. Patient required a medical consult for Hypertension and a diabetic consult for elevated blood sugars. The patient was noncompliant with his diabetic diet becoming agitated when he felt staff were not providing him with enough snacks. He appeared to have very limited insight into his medical and psychiatric problems. Patient requesting discharge early angrily telling treatment team "My blood pressure always stays high. It's never stopped them from letting me go before. There is nothing wrong with me mentally. Just let me go." The patient was prone to becoming agitated when talking about medical or psychiatric problems during routine assessments due to his lack of insight. Patient was started on blood pressure medicine and his insulin was increased per the recommendations from the diabetic coordinator. The patient denied any psychiatric symptoms and seemed anxious to start with his rehab at [facility] on  04/26/13. Patient attended treatment team meeting this am and met with treatment team members. Pt symptoms, treatment plan and response to treatment discussed. [name] endorsed that their symptoms have improved. Pt also stated that they are stable for discharge.  In other to control Active Problems:   Schizophrenia   Drug abuse , they will continue psychiatric care on outpatient basis. They will follow-up at  Follow-up Information   Follow up with [facility]. (Go to the walk-in clinic M-F between 8 and 9AM for your hospital follow up appointment)    Contact information:   [address]  [place]  [336] [phone]      Follow up with [facility] rehab On 04/26/2013. (Arrive at 8AM sharp for your screening appointment)    Contact information:   [address]  [[phone]    .  In addition they were instructed to take all your medications as prescribed by your mental healthcare provider, to report any adverse effects and or reactions from your medicines to your outpatient provider promptly, patient is instructed and cautioned to not engage in alcohol and or illegal drug  use while on prescription medicines, in the event of worsening symptoms, patient is instructed to call the crisis hotline, 911 and or go to the nearest ED for appropriate evaluation and treatment of symptoms.   Upon discharge, patient adamantly denies suicidal, homicidal ideations, auditory, visual hallucinations and or delusional thinking. They left [facility] with all personal belongings in no apparent distress.  Consults:  See electronic record for details  Significant Diagnostic Studies:  See electronic record for details  Discharge Vitals:   Blood pressure 165/94, pulse 81, temperature 98.3 F (36.8 C), temperature source Oral, resp. rate 18, height 5\' 8"  (1.727 m), weight 85.276 kg (188 lb), SpO2 98.00%..  Mental Status Exam: See Mental Status Examination and Suicide Risk Assessment completed by  Attending Physician prior to discharge.  Discharge destination:  Home  Is patient on multiple antipsychotic therapies at discharge:  No  Has Patient had three or more failed trials of antipsychotic monotherapy by history: N/A Recommended Plan for Multiple Antipsychotic Therapies: N/A Discharge Orders   Future Orders Complete By Expires   Discharge instructions  As directed    Comments:     Patient instructed to set up follow up appointment with his Primary Care MD after discharge to follow up for his HTN and Diabetes. Patient agreed to do this and reported that he would see the MD who follows his cousin.       Medication List       Indication   cloNIDine 0.2 MG tablet  Commonly known as:  CATAPRES  Take 1 tablet (0.2 mg total) by mouth 2 (two) times daily.   Indication:  Hypertensive Urgency     fluPHENAZine 5 MG tablet  Commonly known as:  PROLIXIN  Take 1 tablet (5 mg total) by mouth daily.   Indication:  Psychosis     fluPHENAZine decanoate 25 MG/ML injection  Commonly known as:  PROLIXIN  Inject 1 mL (25 mg total) into the muscle every 14 (fourteen) days. For schizophrenia with last dose being administered on 04/18/13.   Indication:  Schizophrenia     gabapentin 100 MG capsule  Commonly known as:  NEURONTIN  Take 1 capsule (100 mg total) by mouth 2 (two) times daily.   Indication:  Neuropathic Pain     insulin glargine 100 UNIT/ML injection  Commonly known as:  LANTUS  Inject 0.1 mLs (10 Units total) into the skin at bedtime.   Indication:  Type 2 Diabetes     isosorbide mononitrate 30 MG 24 hr tablet  Commonly known as:  IMDUR  Take 1 tablet (30 mg total) by mouth daily.   Indication:  Elevated blood pressure     lisinopril 40 MG tablet  Commonly known as:  PRINIVIL,ZESTRIL  Take 1 tablet (40 mg total) by mouth daily.   Indication:  High Blood Pressure     metFORMIN 500 MG tablet  Commonly known as:  GLUCOPHAGE  Take 1 tablet (500 mg total) by mouth 2 (two)  times daily with a meal. For glycemic control.   Indication:  Type 2 Diabetes     traZODone 100 MG tablet  Commonly known as:  DESYREL  Take 1 tablet (100 mg total) by mouth at bedtime as needed for sleep.   Indication:  Trouble Sleeping           Follow-up Information   Follow up with [name]. (Go to the walk-in clinic M-F between 8 and 9AM for your hospital follow up appointment)    Contact information:  [address]  [place]  [336] [phone]      Follow up with [facility] rehab On 04/26/2013. (Arrive at 8AM sharp for your screening appointment)    Contact information:   [address]  [facility]  [phone]     Follow-up recommendations:   Activities: Resume typical activities Diet: Resume typical diet Tests: none Other: Follow up with outpatient provider and report any side effects to out patient prescriber.  Comments:  Take all your medications as prescribed by your mental healthcare provider. Report any adverse effects and or reactions from your medicines to your outpatient provider promptly. Patient is instructed and cautioned to not engage in alcohol and or illegal drug use while on prescription medicines. In the event of worsening symptoms, patient is instructed to call the crisis hotline, 911 and or go to the nearest ED for appropriate evaluation and treatment of symptoms. Follow-up with your primary care provider for your other medical issues, concerns and or health care needs.  SignedFransisca Kaufmann NP-C 04/23/2013 11:04 AM

## 2013-04-23 NOTE — [facility] Group Notes (Signed)
[facility] LCSW Aftercare Discharge Planning Group Note   04/23/2013 9:33 AM  Participation Quality:  Active  Mood/Affect:  Appropriate and Excited  Depression Rating:  0  Anxiety Rating:  0  Thoughts of Suicide:  No Will you contract for safety?   Yes  Current AVH:  No  Plan for Discharge/Comments:  Patient was very excited and jovial in group today making all members laugh at his jokes. He reports excitement about leaving and will follow up at [facility] for a potential inpatient screening.  Patient will receive and follow up with medication at [facility].  He plans to live with his cousin as he reports it is getting colder and his skin is sensitive.  Transportation Means: Cousin will come pick him up  Supports: Family.  [place], Catalina Gravel

## 2013-04-23 NOTE — [facility] Suicide Risk Assessment (Signed)
Suicide Risk Assessment  Discharge Assessment     Demographic Factors:  Male, Low socioeconomic status and Unemployed  Mental Status Per Nursing Assessment::   On Admission:  NA  Current Mental Status by Physician: patient denies suicidal ideation, intent or plan  Loss Factors: Financial problems/change in socioeconomic status  Historical Factors: Family history of mental illness or substance abuse and Impulsivity  Risk Reduction Factors:   Sense of responsibility to family and Living with another person, especially a relative  Continued Clinical Symptoms:  Alcohol/Substance Abuse/Dependencies  Cognitive Features That Contribute To Risk:  Closed-mindedness Polarized thinking    Suicide Risk:  Minimal: No identifiable suicidal ideation.  Patients presenting with no risk factors but with morbid ruminations; may be classified as minimal risk based on the severity of the depressive symptoms  Discharge Diagnoses:   AXIS I:  Chronic Paranoid Schizophrenia              Cocaine use disorder severe  AXIS II:  Deferred AXIS III:   Past Medical History  Diagnosis Date  . Diabetes mellitus without complication   . Hypertension   . Schizophrenia    AXIS IV:  economic problems, other psychosocial or environmental problems and problems related to social environment AXIS V:  61-70 mild symptoms  Plan Of Care/Follow-up recommendations:  Activity:  as tolerated Diet:  healthy Tests:  routine Other:  patient to keep his after care appointment  Is patient on multiple antipsychotic therapies at discharge:  No   Has Patient had three or more failed trials of antipsychotic monotherapy by history:  No  Recommended Plan for Multiple Antipsychotic Therapies: NA  Thedore Mins, MD 04/23/2013, 10:03 AM

## 2013-04-23 NOTE — Progress Notes (Signed)
Patient ID: [name], male   DOB: [birth year], 52 y.o.   MRN: [id number] Pt. Discharged per MD orders;  PT. Currently denies any HI/SI or AVH.  Pt. Was given education regarding follow up appointments and medications by RN and expresses understanding of all discharge/follow up instruction.  Pt. Denies any questions or concerns about the medications.  Pt. Was escorted to the search room to retrieve his/her belongings by RN before being discharged to the hospital lobby.

## 2013-04-23 NOTE — Progress Notes (Signed)
Recreation Therapy Notes  Date: 10.24.2014 Time: 9:30am Location: [address] Dayroom   Group Topic: Decision Making  Goal Area(s) Addresses:  Patient will verbalize benefit of using good decision making skills. Patient will identify method of good decision making.  Behavioral Response: Did not attend.   Marykay Lex Danish Ruffins, LRT/CTRS  Emonni Depasquale L 04/23/2013 12:12 PM

## 2013-04-26 LAB — GLUCOSE, CAPILLARY: Glucose-Capillary: 346 mg/dL — ABNORMAL HIGH (ref 70–99)

## 2013-04-27 NOTE — Progress Notes (Signed)
Patient Discharge Instructions:  After Visit Summary (AVS):   Faxed to:  04/27/13 Discharge Summary Note:   Faxed to:  04/27/13 Psychiatric Admission Assessment Note:   Faxed to:  04/27/13 Suicide Risk Assessment - Discharge Assessment:   Faxed to:  04/27/13 Faxed/Sent to the Next Level Care provider:  04/27/13 Faxed to [facility] @ [phone] Faxed to [employer] @ [phone]  Jerelene Redden, 04/27/2013, 3:21 PM

## 2013-04-27 NOTE — Discharge Summary (Signed)
Seen and agreed. Ty Oshima, MD 

## 2013-05-10 ENCOUNTER — Encounter (HOSPITAL_COMMUNITY): Payer: Self-pay | Admitting: Emergency Medicine

## 2013-05-10 ENCOUNTER — Emergency Department (HOSPITAL_COMMUNITY)
Admission: EM | Admit: 2013-05-10 | Discharge: 2013-05-10 | Disposition: A | Discharge status: Home / Self Care | Payer: Medicare Other | Attending: Emergency Medicine | Admitting: Emergency Medicine

## 2013-05-10 DIAGNOSIS — I10 Essential (primary) hypertension: Secondary | ICD-10-CM | POA: Insufficient documentation

## 2013-05-10 DIAGNOSIS — E1149 Type 2 diabetes mellitus with other diabetic neurological complication: Secondary | ICD-10-CM | POA: Insufficient documentation

## 2013-05-10 DIAGNOSIS — R739 Hyperglycemia, unspecified: Secondary | ICD-10-CM

## 2013-05-10 DIAGNOSIS — F209 Schizophrenia, unspecified: Secondary | ICD-10-CM | POA: Insufficient documentation

## 2013-05-10 DIAGNOSIS — Z79899 Other long term (current) drug therapy: Secondary | ICD-10-CM | POA: Insufficient documentation

## 2013-05-10 DIAGNOSIS — Z794 Long term (current) use of insulin: Secondary | ICD-10-CM | POA: Insufficient documentation

## 2013-05-10 DIAGNOSIS — E1140 Type 2 diabetes mellitus with diabetic neuropathy, unspecified: Secondary | ICD-10-CM

## 2013-05-10 DIAGNOSIS — F411 Generalized anxiety disorder: Secondary | ICD-10-CM | POA: Insufficient documentation

## 2013-05-10 DIAGNOSIS — F17200 Nicotine dependence, unspecified, uncomplicated: Secondary | ICD-10-CM | POA: Insufficient documentation

## 2013-05-10 DIAGNOSIS — F1410 Cocaine abuse, uncomplicated: Secondary | ICD-10-CM | POA: Insufficient documentation

## 2013-05-10 DIAGNOSIS — E1142 Type 2 diabetes mellitus with diabetic polyneuropathy: Secondary | ICD-10-CM | POA: Insufficient documentation

## 2013-05-10 LAB — POCT I-STAT, CHEM 8
BUN: 16 mg/dL (ref 6–23)
Calcium, Ion: 1.3 mmol/L — ABNORMAL HIGH (ref 1.12–1.23)
Chloride: 101 mEq/L (ref 96–112)
Creatinine, Ser: 1.2 mg/dL (ref 0.50–1.35)
Glucose, Bld: 230 mg/dL — ABNORMAL HIGH (ref 70–99)
HCT: 40 % (ref 39.0–52.0)
Potassium: 3.5 mEq/L (ref 3.5–5.1)
Sodium: 142 mEq/L (ref 135–145)

## 2013-05-10 LAB — GLUCOSE, CAPILLARY: Glucose-Capillary: 252 mg/dL — ABNORMAL HIGH (ref 70–99)

## 2013-05-10 NOTE — ED Provider Notes (Signed)
Medical screening examination/treatment/procedure(s) were performed by non-physician practitioner and as supervising physician I was immediately available for consultation/collaboration.    Sunnie Nielsen, MD 05/10/13 [phone]

## 2013-05-10 NOTE — ED Notes (Signed)
Bed: ZO10 Expected date: 05/10/13 Expected time: 2:23 AM Means of arrival: Ambulance Comments: Bed 18, EMS, 52 M, SI

## 2013-05-10 NOTE — ED Provider Notes (Signed)
CSN: [id number]     Arrival date & time 05/10/13  0223 History   First MD Initiated Contact with Patient 05/10/13 0232     Chief Complaint  Patient presents with  . Foot Pain   HPI  History provided by the patient. He is a 52 year old male with history of hypertension, diabetes, schizophrenia and crack cocaine abuse who presents to the emergency department with complaints of foot pain. Patient states that he called [facility] police because he felt he would need to come to the emergency department. He complains of worsened bilateral foot pain from walking so much and being on the street. He states that he is tired of using cocaine and has used $200 worth of cocaine today prior to arrival. Patient states he has been to behavioral health several times in the past but each time he leaves and continues to use. He reports having some depression but denies SI or HI. He denies any hallucinations. He also reports being hungry and requests food and drink.    Past Medical History  Diagnosis Date  . Diabetes mellitus without complication   . Hypertension   . Schizophrenia    History reviewed. No pertinent past surgical history. History reviewed. No pertinent family history. History  Substance Use Topics  . Smoking status: Current Every Day Smoker -- 2.00 packs/day    Types: Cigarettes  . Smokeless tobacco: Current User  . Alcohol Use: Yes    Review of Systems  Constitutional: Negative for fever, chills and diaphoresis.  Neurological: Negative for weakness and numbness.  Psychiatric/Behavioral: Negative for suicidal ideas.  All other systems reviewed and are negative.    Allergies  Haldol  Home Medications   Current Outpatient Rx  Name  Route  Sig  Dispense  Refill  . cloNIDine (CATAPRES) 0.2 MG tablet   Oral   Take 1 tablet (0.2 mg total) by mouth 2 (two) times daily.   60 tablet   0   . fluPHENAZine (PROLIXIN) 5 MG tablet   Oral   Take 1 tablet (5 mg total) by mouth daily.   30 tablet   0   . fluPHENAZine decanoate (PROLIXIN) 25 MG/ML injection   Intramuscular   Inject 1 mL (25 mg total) into the muscle every 14 (fourteen) days. For schizophrenia with last dose being administered on 04/18/13.   5 mL   0     Patient received last dose in the hospital on 10/1 ...   . gabapentin (NEURONTIN) 100 MG capsule   Oral   Take 1 capsule (100 mg total) by mouth 2 (two) times daily.   60 capsule   0   . insulin glargine (LANTUS) 100 UNIT/ML injection   Subcutaneous   Inject 0.1 mLs (10 Units total) into the skin at bedtime.   10 mL   12   . isosorbide mononitrate (IMDUR) 30 MG 24 hr tablet   Oral   Take 1 tablet (30 mg total) by mouth daily.   30 tablet   0   . lisinopril (PRINIVIL,ZESTRIL) 40 MG tablet   Oral   Take 1 tablet (40 mg total) by mouth daily.   30 tablet   0   . metFORMIN (GLUCOPHAGE) 500 MG tablet   Oral   Take 1 tablet (500 mg total) by mouth 2 (two) times daily with a meal. For glycemic control.   60 tablet   0   . traZODone (DESYREL) 100 MG tablet   Oral   Take 1 tablet (  100 mg total) by mouth at bedtime as needed for sleep.   30 tablet   0    There were no vitals taken for this visit. Physical Exam  Nursing note and vitals reviewed. Constitutional: He is oriented to person, place, and time. He appears well-developed and well-nourished. No distress.  HENT:  Head: Normocephalic.  Cardiovascular: Normal rate and regular rhythm.   Pulmonary/Chest: Effort normal and breath sounds normal. No respiratory distress. He has no wheezes. He has no rales.  Abdominal: Soft. There is no tenderness. There is no rebound and no guarding.  Musculoskeletal: Normal range of motion.  Normal range of motion of bilateral feet. There is dry skin and thick calluses to soles of feet. No cracking or bleeding. No swelling or erythema of the skin. Normal dorsal pedal pulses sensation to light touch. Temperature normal and equal bilaterally. No concerns  for infection.  Neurological: He is alert and oriented to person, place, and time.  Skin: Skin is warm.  Psychiatric: His behavior is normal. His mood appears anxious.    ED Course  Procedures  DIAGNOSTIC STUDIES: Oxygen Saturation is 99% on room air.    COORDINATION OF CARE:  Nursing notes reviewed. Vital signs reviewed. Initial pt interview and examination performed.   2:40 AM-patient seen and evaluated. He is well appearing acute distress. Discussed work up plan with pt at bedside, which includes CBG. We'll also provide basin to clean his feet. Pt agrees with plan.  Results for orders placed during the hospital encounter of 05/10/13  GLUCOSE, CAPILLARY      Result Value Range   Glucose-Capillary 252 (*) 70 - 99 mg/dL   Comment 1 Notify RN    POCT I-STAT, CHEM 8      Result Value Range   Sodium 142  135 - 145 mEq/L   Potassium 3.5  3.5 - 5.1 mEq/L   Chloride 101  96 - 112 mEq/L   BUN 16  6 - 23 mg/dL   Creatinine, Ser 1.61  0.50 - 1.35 mg/dL   Glucose, Bld 096 (*) 70 - 99 mg/dL   Calcium, Ion 0.45 (*) 1.12 - 1.23 mmol/L   TCO2 28  0 - 100 mmol/L   Hemoglobin 13.6  13.0 - 17.0 g/dL   HCT 40.9  81.1 - 91.4 %      MDM   1. Hyperglycemia   2. Cocaine abuse   3. Diabetic neuropathy        Angus Seller, PA-C 05/10/13 [phone]

## 2013-05-10 NOTE — ED Notes (Signed)
Per EMS: Pt called PD saying he had mental issues. CBG 220. VS stable. BP 160/86. No SI, pt just stated he "isn't right."

## 2013-05-26 ENCOUNTER — Emergency Department (HOSPITAL_COMMUNITY)
Admission: EM | Admit: 2013-05-26 | Discharge: 2013-05-26 | Disposition: A | Discharge status: Other Acute Inpatient Hospital | Payer: Medicare Other | Source: Home / Self Care

## 2013-05-26 ENCOUNTER — Inpatient Hospital Stay (HOSPITAL_COMMUNITY)
Admission: AD | Admit: 2013-05-26 | Discharge: 2013-05-28 | DRG: 885 | Disposition: A | Discharge status: Home / Self Care | Payer: Medicare Other | Source: Intra-hospital | Attending: Psychiatry | Admitting: Psychiatry

## 2013-05-26 ENCOUNTER — Encounter (HOSPITAL_COMMUNITY): Payer: Self-pay | Admitting: Emergency Medicine

## 2013-05-26 ENCOUNTER — Encounter (HOSPITAL_COMMUNITY): Payer: Self-pay | Admitting: *Deleted

## 2013-05-26 DIAGNOSIS — Z79899 Other long term (current) drug therapy: Secondary | ICD-10-CM

## 2013-05-26 DIAGNOSIS — F1910 Other psychoactive substance abuse, uncomplicated: Secondary | ICD-10-CM

## 2013-05-26 DIAGNOSIS — F1420 Cocaine dependence, uncomplicated: Secondary | ICD-10-CM | POA: Diagnosis present

## 2013-05-26 DIAGNOSIS — R45851 Suicidal ideations: Secondary | ICD-10-CM

## 2013-05-26 DIAGNOSIS — E119 Type 2 diabetes mellitus without complications: Secondary | ICD-10-CM | POA: Diagnosis present

## 2013-05-26 DIAGNOSIS — F209 Schizophrenia, unspecified: Secondary | ICD-10-CM

## 2013-05-26 DIAGNOSIS — F200 Paranoid schizophrenia: Principal | ICD-10-CM | POA: Diagnosis present

## 2013-05-26 DIAGNOSIS — I10 Essential (primary) hypertension: Secondary | ICD-10-CM | POA: Diagnosis present

## 2013-05-26 LAB — CBC WITH DIFFERENTIAL/PLATELET
Basophils Absolute: 0 10*3/uL (ref 0.0–0.1)
Basophils Relative: 1 % (ref 0–1)
Eosinophils Absolute: 0 10*3/uL (ref 0.0–0.7)
Eosinophils Relative: 0 % (ref 0–5)
HCT: 35.9 % — ABNORMAL LOW (ref 39.0–52.0)
Hemoglobin: 11.8 g/dL — ABNORMAL LOW (ref 13.0–17.0)
Lymphs Abs: 1.2 10*3/uL (ref 0.7–4.0)
MCH: 29.1 pg (ref 26.0–34.0)
MCHC: 32.9 g/dL (ref 30.0–36.0)
MCV: 88.4 fL (ref 78.0–100.0)
Monocytes Absolute: 0.5 10*3/uL (ref 0.1–1.0)
Monocytes Relative: 6 % (ref 3–12)
Neutrophils Relative %: 75 % (ref 43–77)
Platelets: 247 10*3/uL (ref 150–400)
RBC: 4.06 MIL/uL — ABNORMAL LOW (ref 4.22–5.81)
RDW: 13.2 % (ref 11.5–15.5)
WBC: 7.1 10*3/uL (ref 4.0–10.5)

## 2013-05-26 LAB — GLUCOSE, CAPILLARY
Glucose-Capillary: 221 mg/dL — ABNORMAL HIGH (ref 70–99)
Glucose-Capillary: 175 mg/dL — ABNORMAL HIGH (ref 70–99)
Glucose-Capillary: 150 mg/dL — ABNORMAL HIGH (ref 70–99)
Glucose-Capillary: 179 mg/dL — ABNORMAL HIGH (ref 70–99)

## 2013-05-26 LAB — POCT I-STAT, CHEM 8
BUN: 22 mg/dL (ref 6–23)
Calcium, Ion: 1.3 mmol/L — ABNORMAL HIGH (ref 1.12–1.23)
Creatinine, Ser: 1.3 mg/dL (ref 0.50–1.35)
Potassium: 3 mEq/L — ABNORMAL LOW (ref 3.5–5.1)
TCO2: 28 mmol/L (ref 0–100)

## 2013-05-26 LAB — RAPID URINE DRUG SCREEN, HOSP PERFORMED
Amphetamines: NOT DETECTED
Barbiturates: NOT DETECTED
Cocaine: POSITIVE — AB
Opiates: NOT DETECTED
Tetrahydrocannabinol: NOT DETECTED

## 2013-05-26 MED ORDER — ISOSORBIDE MONONITRATE ER 30 MG PO TB24
30.0000 mg | ORAL_TABLET | Freq: Every day | ORAL | Status: DC
  Administered 2013-05-26 – 2013-05-28 (×3): 30 mg via ORAL
  Filled 2013-05-26 (×6): qty 1

## 2013-05-26 MED ORDER — INSULIN ASPART 100 UNIT/ML ~~LOC~~ SOLN
0.0000 [IU] | Freq: Three times a day (TID) | SUBCUTANEOUS | Status: DC
  Administered 2013-05-26: 2 [IU] via SUBCUTANEOUS
  Administered 2013-05-26 – 2013-05-27 (×2): 3 [IU] via SUBCUTANEOUS
  Administered 2013-05-27: 2 [IU] via SUBCUTANEOUS
  Administered 2013-05-27: 3 [IU] via SUBCUTANEOUS
  Administered 2013-05-28: 2 [IU] via SUBCUTANEOUS

## 2013-05-26 MED ORDER — LISINOPRIL 40 MG PO TABS
40.0000 mg | ORAL_TABLET | Freq: Every day | ORAL | Status: DC
  Administered 2013-05-26 – 2013-05-28 (×3): 40 mg via ORAL
  Filled 2013-05-26: qty 1
  Filled 2013-05-26: qty 2
  Filled 2013-05-26 (×4): qty 1

## 2013-05-26 MED ORDER — NICOTINE 21 MG/24HR TD PT24
21.0000 mg | MEDICATED_PATCH | Freq: Every day | TRANSDERMAL | Status: DC
  Administered 2013-05-27: 21 mg via TRANSDERMAL
  Filled 2013-05-26 (×6): qty 1

## 2013-05-26 MED ORDER — CLONIDINE HCL 0.2 MG PO TABS
0.2000 mg | ORAL_TABLET | Freq: Two times a day (BID) | ORAL | Status: DC
  Administered 2013-05-26 – 2013-05-28 (×4): 0.2 mg via ORAL
  Filled 2013-05-26 (×2): qty 1
  Filled 2013-05-26: qty 6
  Filled 2013-05-26 (×3): qty 1
  Filled 2013-05-26: qty 6
  Filled 2013-05-26: qty 1

## 2013-05-26 MED ORDER — GABAPENTIN 100 MG PO CAPS
100.0000 mg | ORAL_CAPSULE | Freq: Two times a day (BID) | ORAL | Status: DC
  Administered 2013-05-26: 100 mg via ORAL
  Filled 2013-05-26 (×4): qty 1

## 2013-05-26 MED ORDER — INFLUENZA VAC SPLIT QUAD 0.5 ML IM SUSP
0.5000 mL | INTRAMUSCULAR | Status: AC
  Administered 2013-05-27: 0.5 mL via INTRAMUSCULAR
  Filled 2013-05-26: qty 0.5

## 2013-05-26 MED ORDER — HYDROXYZINE HCL 25 MG PO TABS: 25.0000 mg | ORAL_TABLET | Freq: Four times a day (QID) | ORAL | Status: DC | PRN

## 2013-05-26 MED ORDER — FLUPHENAZINE HCL 5 MG PO TABS
5.0000 mg | ORAL_TABLET | Freq: Every day | ORAL | Status: DC
  Administered 2013-05-26 – 2013-05-28 (×3): 5 mg via ORAL
  Filled 2013-05-26: qty 3
  Filled 2013-05-26 (×5): qty 1

## 2013-05-26 MED ORDER — ACETAMINOPHEN 325 MG PO TABS
650.0000 mg | ORAL_TABLET | Freq: Four times a day (QID) | ORAL | Status: DC | PRN
  Administered 2013-05-27: 650 mg via ORAL
  Filled 2013-05-26: qty 2

## 2013-05-26 MED ORDER — METFORMIN HCL 500 MG PO TABS
500.0000 mg | ORAL_TABLET | Freq: Two times a day (BID) | ORAL | Status: DC
  Administered 2013-05-26 – 2013-05-28 (×4): 500 mg via ORAL
  Filled 2013-05-26 (×8): qty 1

## 2013-05-26 MED ORDER — TRAZODONE HCL 50 MG PO TABS
100.0000 mg | ORAL_TABLET | Freq: Every evening | ORAL | Status: DC | PRN
  Administered 2013-05-26: 100 mg via ORAL
  Filled 2013-05-26: qty 2

## 2013-05-26 MED ORDER — NICOTINE 21 MG/24HR TD PT24: 21.0000 mg | MEDICATED_PATCH | Freq: Every day | TRANSDERMAL | Status: DC

## 2013-05-26 MED ORDER — PNEUMOCOCCAL VAC POLYVALENT 25 MCG/0.5ML IJ INJ
0.5000 mL | INJECTION | INTRAMUSCULAR | Status: AC
  Administered 2013-05-27: 0.5 mL via INTRAMUSCULAR

## 2013-05-26 MED ORDER — TRAZODONE HCL 100 MG PO TABS: 100.0000 mg | ORAL_TABLET | Freq: Every evening | ORAL | Status: DC | PRN

## 2013-05-26 MED ORDER — LISINOPRIL 20 MG PO TABS: 40.0000 mg | ORAL_TABLET | Freq: Every day | ORAL | Status: DC

## 2013-05-26 MED ORDER — INSULIN GLARGINE 100 UNIT/ML ~~LOC~~ SOLN
10.0000 [IU] | Freq: Every day | SUBCUTANEOUS | Status: DC
  Administered 2013-05-26 – 2013-05-27 (×2): 10 [IU] via SUBCUTANEOUS

## 2013-05-26 MED ORDER — ONDANSETRON HCL 4 MG PO TABS: 4.0000 mg | ORAL_TABLET | Freq: Three times a day (TID) | ORAL | Status: DC | PRN

## 2013-05-26 MED ORDER — FLUPHENAZINE DECANOATE 25 MG/ML IJ SOLN
25.0000 mg | INTRAMUSCULAR | Status: DC
  Administered 2013-05-26: 25 mg via INTRAMUSCULAR
  Filled 2013-05-26: qty 1

## 2013-05-26 MED ORDER — IBUPROFEN 200 MG PO TABS
600.0000 mg | ORAL_TABLET | Freq: Three times a day (TID) | ORAL | Status: DC | PRN
  Administered 2013-05-26: 600 mg via ORAL
  Filled 2013-05-26: qty 3

## 2013-05-26 MED ORDER — GABAPENTIN 100 MG PO CAPS
100.0000 mg | ORAL_CAPSULE | Freq: Two times a day (BID) | ORAL | Status: DC
  Administered 2013-05-26 – 2013-05-28 (×4): 100 mg via ORAL
  Filled 2013-05-26: qty 6
  Filled 2013-05-26 (×5): qty 1
  Filled 2013-05-26: qty 6
  Filled 2013-05-26: qty 1

## 2013-05-26 MED ORDER — INSULIN ASPART 100 UNIT/ML ~~LOC~~ SOLN
4.0000 [IU] | Freq: Three times a day (TID) | SUBCUTANEOUS | Status: DC
  Administered 2013-05-26 – 2013-05-28 (×7): 4 [IU] via SUBCUTANEOUS

## 2013-05-26 MED ORDER — METFORMIN HCL 500 MG PO TABS: 500.0000 mg | ORAL_TABLET | Freq: Two times a day (BID) | ORAL | Status: DC

## 2013-05-26 MED ORDER — CLONIDINE HCL 0.2 MG PO TABS
0.2000 mg | ORAL_TABLET | Freq: Two times a day (BID) | ORAL | Status: DC
  Administered 2013-05-26: 0.2 mg via ORAL
  Filled 2013-05-26: qty 1

## 2013-05-26 MED ORDER — ALUM & MAG HYDROXIDE-SIMETH 200-200-20 MG/5ML PO SUSP: 30.0000 mL | ORAL | Status: DC | PRN

## 2013-05-26 MED ORDER — ZOLPIDEM TARTRATE 5 MG PO TABS: 5.0000 mg | ORAL_TABLET | Freq: Every evening | ORAL | Status: DC | PRN

## 2013-05-26 MED ORDER — MAGNESIUM HYDROXIDE 400 MG/5ML PO SUSP: 30.0000 mL | Freq: Every day | ORAL | Status: DC | PRN

## 2013-05-26 MED ORDER — INSULIN GLARGINE 100 UNIT/ML ~~LOC~~ SOLN
10.0000 [IU] | Freq: Every day | SUBCUTANEOUS | Status: DC
  Administered 2013-05-26: 10 [IU] via SUBCUTANEOUS
  Filled 2013-05-26 (×2): qty 0.1

## 2013-05-26 NOTE — ED Notes (Signed)
CBG 221 

## 2013-05-26 NOTE — H&P (Signed)
Psychiatric Admission Assessment Adult  Patient Identification:  [name] Date of Evaluation:  05/26/2013 Chief Complaint:  MAJOR DEPRESSIVE DISORDER, RECURRENT  History of Present Illness:Patient presented to the ED for admission for his suicidal ideation. This is his 3rd admission to [facility] in 3 months. He has continued to use crack cocaine and alcohol.  He has been non compliant with his medication or follow up at [facility] as he was asked to do.Now states he is suicidal with a plan to jump in front of a car or off a bridge. Elements:  Location:  Adult unit in patient. Quality:  chronic. Severity:  moderate. Timing:  1 month. Duration:  since last discharge from [facility] 1 month ago. Context:  homeless, non compliant, continuing to use cocaine and alcohol. Associated Signs/Synptoms: Depression Symptoms:  depressed mood, insomnia, fatigue, hopelessness, (Hypo) Manic Symptoms:  Impulsivity, Irritable Mood, Anxiety Symptoms:  Excessive Worry, Panic Symptoms, Psychotic Symptoms:  Hallucinations: Auditory PTSD Symptoms: NA  Psychiatric Specialty Exam: Physical Exam  Constitutional: He appears well-developed and well-nourished.    ROS  Blood pressure 107/68, pulse 91, temperature 97.5 F (36.4 C), temperature source Oral, resp. rate 18, height 5' 8.5" (1.74 m), weight 83.462 kg (184 lb).Body mass index is 27.57 kg/(m^2).  General Appearance: Disheveled  Eye Solicitor::  None  Speech:  Clear and Coherent  Volume:  Normal  Mood:  Depressed  Affect:  Blunt  Thought Process:  Goal Directed  Orientation:  Full (Time, Place, and Person)  Thought Content:  WDL  Suicidal Thoughts:  No  Homicidal Thoughts:  No  Memory:  NA  Judgement:  Impaired  Insight:  Lacking  Psychomotor Activity:  Normal  Concentration:  Poor  Recall:  Poor  Akathisia:  No  Handed:  Right  AIMS (if indicated):     Assets:  Communication Skills  Sleep:       Past Psychiatric History: Diagnosis:   Hospitalizations:  Outpatient Care:  Substance Abuse Care:  Self-Mutilation:  Suicidal Attempts:  Violent Behaviors:   Past Medical History:   Past Medical History  Diagnosis Date  . Diabetes mellitus without complication   . Hypertension   . Schizophrenia    None. Allergies:   Allergies  Allergen Reactions  . Haldol [Haloperidol] Other (See Comments)    Muscle spasms, loss of voluntary movement   PTA Medications: No prescriptions prior to admission    Previous Psychotropic Medications:  Medication/Dose                 Substance Abuse History in the last 12 months:  yes  Consequences of Substance Abuse: Medical Consequences:  worsening mental health  Social History:  reports that he has been smoking Cigarettes.  He has been smoking about 2.00 packs per day. He uses smokeless tobacco. He reports that he drinks alcohol. He reports that he uses illicit drugs (Cocaine). Additional Social History:                      Current Place of Residence:   Place of Birth:   Family Members: Marital Status:  Single Children:  Sons:  Daughters: Relationships: Education:  [employer] Problems/Performance: Religious Beliefs/Practices: History of Abuse (Emotional/Phsycial/Sexual) Teacher, music History:  None. Legal History: Hobbies/Interests:  Family History:  History reviewed. No pertinent family history.  Results for orders placed during the hospital encounter of 05/26/13 (from the past 72 hour(s))  GLUCOSE, CAPILLARY     Status: Abnormal   Collection Time  05/26/13 12:12 PM      Result Value Range   Glucose-Capillary 175 (*) 70 - 99 mg/dL   Comment 1 Notify RN    GLUCOSE, CAPILLARY     Status: Abnormal   Collection Time    05/26/13  4:47 PM      Result Value Range   Glucose-Capillary 150 (*) 70 - 99 mg/dL   Comment 1 Notify RN    GLUCOSE, CAPILLARY     Status: Abnormal   Collection Time    05/26/13  8:43 PM       Result Value Range   Glucose-Capillary 179 (*) 70 - 99 mg/dL   Comment 1 Notify RN     Psychological Evaluations:  Assessment:   DSM5:  Schizophrenia Disorders:  Schizophrenia, paranoid type Obsessive-Compulsive Disorders:   Trauma-Stressor Disorders:   Substance/Addictive Disorders:  Cocaine abuse  Depressive Disorders:    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 Diabetes mellitus without complication   . Hypertension   . Schizophrenia    AXIS IV:  housing problems and problems related to social environment AXIS V:  51-60 moderate symptoms  Treatment Plan/Recommendations:  1. Admit for crisis management and stabilization. 2. Medication management to reduce current symptoms to base line and improve the patient's overall level of functioning. 3. Treat health problems as indicated. 4. Develop treatment plan to decrease risk of relapse upon discharge and to reduce the need for readmission. 5. Psycho-social education regarding relapse prevention and self care. 6. Health care follow up as needed for medical problems. 7. Restart home medications where appropriate.  Treatment Plan Summary: Daily contact with patient to assess and evaluate symptoms and progress in treatment Medication management Supportive approach/coping skills/relapse prevention Identify detox needs Reassess and address the co morbidities Optimize treatment with psychotropics Current Medications:  Current Facility-Administered Medications  Medication Dose Route Frequency Provider Last Rate Last Dose  . acetaminophen (TYLENOL) tablet 650 mg  650 mg Oral Q6H PRN Sanjuana Kava, NP      . alum & mag hydroxide-simeth (MAALOX/MYLANTA) 200-200-20 MG/5ML suspension 30 mL  30 mL Oral Q4H PRN Sanjuana Kava, NP      . cloNIDine (CATAPRES) tablet 0.2 mg  0.2 mg Oral BID Sanjuana Kava, NP   0.2 mg at 05/26/13 1725  . fluPHENAZine (PROLIXIN) tablet 5 mg  5 mg Oral  Daily Sanjuana Kava, NP   5 mg at 05/26/13 1248  . fluPHENAZine decanoate (PROLIXIN) injection 25 mg  25 mg Intramuscular Q14 Days Sanjuana Kava, NP   25 mg at 05/26/13 1338  . gabapentin (NEURONTIN) capsule 100 mg  100 mg Oral BID Sanjuana Kava, NP   100 mg at 05/26/13 1725  . hydrOXYzine (ATARAX/VISTARIL) tablet 25 mg  25 mg Oral Q6H PRN Sanjuana Kava, NP      . Melene Muller ON 05/27/2013] influenza vac split quadrivalent PF (FLUARIX) injection 0.5 mL  0.5 mL Intramuscular Tomorrow-1000 Nehemiah Settle, MD      . insulin aspart (novoLOG) injection 0-15 Units  0-15 Units Subcutaneous TID WC Sanjuana Kava, NP   2 Units at 05/26/13 1726  . insulin aspart (novoLOG) injection 4 Units  4 Units Subcutaneous TID WC Sanjuana Kava, NP   4 Units at 05/26/13 1728  . insulin glargine (LANTUS) injection 10 Units  10 Units Subcutaneous QHS Sanjuana Kava, NP      .  isosorbide mononitrate (IMDUR) 24 hr tablet 30 mg  30 mg Oral Daily Sanjuana Kava, NP   30 mg at 05/26/13 1248  . lisinopril (PRINIVIL,ZESTRIL) tablet 40 mg  40 mg Oral Daily Sanjuana Kava, NP   40 mg at 05/26/13 1248  . magnesium hydroxide (MILK OF MAGNESIA) suspension 30 mL  30 mL Oral Daily PRN Sanjuana Kava, NP      . metFORMIN (GLUCOPHAGE) tablet 500 mg  500 mg Oral BID WC Sanjuana Kava, NP   500 mg at 05/26/13 1725  . nicotine (NICODERM CQ - dosed in mg/24 hours) patch 21 mg  21 mg Transdermal Q0600 Sanjuana Kava, NP      . Melene Muller ON 05/27/2013] pneumococcal 23 valent vaccine (PNU-IMMUNE) injection 0.5 mL  0.5 mL Intramuscular Tomorrow-1000 Nehemiah Settle, MD      . traZODone (DESYREL) tablet 100 mg  100 mg Oral QHS PRN Sanjuana Kava, NP        Observation Level/Precautions:  Elopement  Laboratory:  reviewed  Psychotherapy:  group  Medications:  As written  Consultations:  If needed  Discharge Concerns:  Increased risk for relapse  Estimated LOS:  2-3 days  Other:     I certify that inpatient services furnished can  reasonably be expected to improve the patient's condition.   MASHBURN,NEIL 11/26/201410:31 PM Personally evaluated the patient, reviewed the physical exam, and agree with assessment and plan Madie Reno A. Dub Mikes, M.D.

## 2013-05-26 NOTE — Progress Notes (Signed)
Patient ID: [name], male   DOB: 1961-02-12, 52 y.o.   MRN: [id number] Patient came in with complaints of foot pain and requesting detox from cocaine; patient uds was positive for cocaine; patient etoh level was negative; patient denies SI/HI and A/V hallucinations; patient is homeless; patient reports that he uses a gram of cocaine daily and the last use was 05/25/13; patient has a history of diabetes, hypertension, and schizophrenia

## 2013-05-26 NOTE — [facility] Suicide Risk Assessment (Signed)
Suicide Risk Assessment  Admission Assessment     Nursing information obtained from:  Patient Demographic factors:  Male;Divorced or widowed Current Mental Status:  NA Loss Factors:  NA Historical Factors:  Family history of mental illness or substance abuse Risk Reduction Factors:  NA  CLINICAL FACTORS:   Alcohol/Substance Abuse/Dependencies Schizophrenia:   Paranoid or undifferentiated type  COGNITIVE FEATURES THAT CONTRIBUTE TO RISK:  Closed-mindedness Polarized thinking Thought constriction (tunnel vision)    SUICIDE RISK:   Moderate:  Frequent suicidal ideation with limited intensity, and duration, some specificity in terms of plans, no associated intent, good self-control, limited dysphoria/symptomatology, some risk factors present, and identifiable protective factors, including available and accessible social support.  PLAN OF CARE: Supportive approach/coping skills/relapse prevention                               Identify detox needs                               Optimize treatment with psychotropics  I certify that inpatient services furnished can reasonably be expected to improve the patient's condition.  Kailyn Dubie A 05/26/2013, 6:44 PM

## 2013-05-26 NOTE — Progress Notes (Signed)
Recreation Therapy Notes  Date: 11.26.2014 Time: 3:00pm Location: [address] Dayroom   Group Topic: Coping Skills  Goal Area(s) Addresses:  Patient will complete grateful mandala. Patient will identify benefit of identifying things he/she is grateful for.    Behavioral Response: Did not attend   Jearl Klinefelter, LRT/CTRS  Jearl Klinefelter 05/26/2013 3:55 PM

## 2013-05-26 NOTE — ED Notes (Signed)
Pt has been accepted at [facility]. Pt will go to 503 bed 2. Accepting physician is Jonnalagada. Pt can be sent to [facility] after 0800.

## 2013-05-26 NOTE — ED Notes (Signed)
[facility] okay'd by Lequita Halt, RN.   New vitals will be taken before patient leaves.

## 2013-05-26 NOTE — ED Notes (Signed)
Report called to [facility] by Tobi Bastos, RN.  [employer] called for pickup. Lequita Halt, Consulting civil engineer, asked to go over [employer] for verification.

## 2013-05-26 NOTE — Progress Notes (Signed)
Pt did not attend wrap-up group   
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
## 2013-05-26 NOTE — Care Management Utilization Note (Signed)
Per State Regulation 482.30  The chart was reviewed for necessity with respect to the patient's Admission/ Duration of stay. Admission 05/26/13.  Next Review Date: 05/29/2013  Lacinda Axon, RN, BSN

## 2013-05-26 NOTE — [facility] Suicide Risk Assessment (Signed)
[facility] INPATIENT:  Family/Significant Other Suicide Prevention Education  Suicide Prevention Education:  Education Completed; [name], [phone] been identified by the patient as the family member/significant other with whom the patient will be residing, and identified as the person(s) who will aid the patient in the event of a mental health crisis (suicidal ideations/suicide attempt).  With written consent from the patient, the family member/significant other has been provided the following suicide prevention education, prior to the and/or following the discharge of the patient.  Cousin advised SPE has been reviewed with her on previous admissions and she knows what to do in the event of an emergency.  The suicide prevention education provided includes the following:  Suicide risk factors  Suicide prevention and interventions  National Suicide Hotline telephone number  [facility] assessment telephone number  [facility] Emergency Assistance 911  [facility] and/or Residential Mobile Crisis Unit telephone number  Request made of family/significant other to:  Remove weapons (e.g., guns, rifles, knives), all items previously/currently identified as safety concern.  Cousin advised patient does not have access to guns.  Remove drugs/medications (over-the-counter, prescriptions, illicit drugs), all items previously/currently identified as a safety concern.  The family member/significant other verbalizes understanding of the suicide prevention education information provided.  The family member/significant other agrees to remove the items of safety concern listed above.  [name] 05/26/2013, 12:53 PM

## 2013-05-26 NOTE — ED Notes (Signed)
Pt is speaking with Berna Spare from [facility] via [employer].

## 2013-05-26 NOTE — ED Notes (Signed)
Pt to ED via [facility] for evaluation of bilateral foot pain.  Ambulatory in triage.

## 2013-05-26 NOTE — ED Provider Notes (Signed)
CSN: [id number]     Arrival date & time 05/26/13  0119 History   First MD Initiated Contact with Patient 05/26/13 0141     Chief Complaint  Patient presents with  . Foot Pain   (Consider location/radiation/quality/duration/timing/severity/associated sxs/prior Treatment) HPI Comments: With a history of schizophrenia, diabetes, diabetic neuropathy, hypertension.  Presents tonight with bilateral foot pain after walking.  His normal 15 miles states he hasn't taken any of his medicine in a few days, because he's been using cocaine, and drinking alcohol.  He is requesting to speak to psychiatry  Patient is a 52 y.o. male presenting with lower extremity pain. The history is provided by the patient.  Foot Pain This is a recurrent problem. The problem has been unchanged. Associated symptoms include arthralgias. Pertinent negatives include no fever, numbness or weakness. Nothing aggravates the symptoms. He has tried nothing for the symptoms. The treatment provided no relief.    Past Medical History  Diagnosis Date  . Diabetes mellitus without complication   . Hypertension   . Schizophrenia    History reviewed. No pertinent past surgical history. No family history on file. History  Substance Use Topics  . Smoking status: Current Every Day Smoker -- 2.00 packs/day    Types: Cigarettes  . Smokeless tobacco: Current User  . Alcohol Use: Yes    Review of Systems  Constitutional: Negative for fever.  Gastrointestinal: Negative for abdominal distention.  Musculoskeletal: Positive for arthralgias.  Neurological: Negative for weakness and numbness.  Psychiatric/Behavioral: Negative for suicidal ideas, hallucinations, confusion and agitation.  All other systems reviewed and are negative.    Allergies  Haldol  Home Medications   Current Outpatient Rx  Name  Route  Sig  Dispense  Refill  . cloNIDine (CATAPRES) 0.2 MG tablet   Oral   Take 1 tablet (0.2 mg total) by mouth 2 (two) times  daily.   60 tablet   0   . fluPHENAZine (PROLIXIN) 5 MG tablet   Oral   Take 1 tablet (5 mg total) by mouth daily.   30 tablet   0   . fluPHENAZine decanoate (PROLIXIN) 25 MG/ML injection   Intramuscular   Inject 1 mL (25 mg total) into the muscle every 14 (fourteen) days. For schizophrenia with last dose being administered on 04/18/13.   5 mL   0     Patient received last dose in the hospital on 10/1 ...   . gabapentin (NEURONTIN) 100 MG capsule   Oral   Take 1 capsule (100 mg total) by mouth 2 (two) times daily.   60 capsule   0   . insulin glargine (LANTUS) 100 UNIT/ML injection   Subcutaneous   Inject 0.1 mLs (10 Units total) into the skin at bedtime.   10 mL   12   . isosorbide mononitrate (IMDUR) 30 MG 24 hr tablet   Oral   Take 1 tablet (30 mg total) by mouth daily.   30 tablet   0   . lisinopril (PRINIVIL,ZESTRIL) 40 MG tablet   Oral   Take 1 tablet (40 mg total) by mouth daily.   30 tablet   0   . metFORMIN (GLUCOPHAGE) 500 MG tablet   Oral   Take 1 tablet (500 mg total) by mouth 2 (two) times daily with a meal. For glycemic control.   60 tablet   0   . traZODone (DESYREL) 100 MG tablet   Oral   Take 1 tablet (100 mg total) by mouth  at bedtime as needed for sleep.   30 tablet   0    There were no vitals taken for this visit. Physical Exam  Nursing note and vitals reviewed. Constitutional: He is oriented to person, place, and time. He appears well-developed and well-nourished.  Patient smells, unwashed  HENT:  Head: Normocephalic.  Eyes: Pupils are equal, round, and reactive to light.  Neck: Normal range of motion.  Cardiovascular: Normal rate and regular rhythm.   Pulmonary/Chest: Effort normal and breath sounds normal.  Musculoskeletal: He exhibits no edema and no tenderness.  Neurological: He is alert and oriented to person, place, and time.  Skin: Skin is warm. No rash noted. No erythema.  Psychiatric: His speech is normal and behavior  is normal. His affect is inappropriate. Cognition and memory are normal. He expresses inappropriate judgment. He expresses no suicidal ideation. He expresses no suicidal plans.    ED Course  Procedures (including critical care time) Labs Review Labs Reviewed  GLUCOSE, CAPILLARY - Abnormal; Notable for the following:    Glucose-Capillary 221 (*)    All other components within normal limits  CBC WITH DIFFERENTIAL  ETHANOL  URINE RAPID DRUG SCREEN (HOSP PERFORMED)   Imaging Review No results found.  EKG Interpretation   None       MDM  No diagnosis found.  I have ordered routine screening labs drug screen.  Patient has been moved to provide C.    Arman Filter, NP 05/26/13 0201

## 2013-05-26 NOTE — [facility] Group Notes (Signed)
[facility] LCSW Group Therapy  Emotional Regulation 1:15 - 2: 30 PM        05/26/2013  2:16 PM   Type of Therapy:  Group Therapy  Participation Level:  Did not attend group.  Wynn Banker 05/26/2013 2:16 PM

## 2013-05-26 NOTE — BH Assessment (Signed)
[facility] Assessment Progress Note      Spoke to Earley Favor regarding Mr [facility] TTS consult.  Off meds using cocaine-requesting to talk to the psychiatrist and soak his feet.  TTS staff will call nurses' station to set up consult.

## 2013-05-26 NOTE — [facility] Counselor (Signed)
Adult Psychosocial Assessment Update Interdisciplinary Team  Previous [facility] admissions/discharges:  Admissions Discharges  Date:04/18/13 Date:  04/23/13  Date: Date:  Date: Date:  Date: Date:  Date: Date:   Changes since the last Psychosocial Assessment (including adherence to outpatient mental health and/or substance abuse treatment, situational issues contributing to decompensation and/or relapse). Patient reports being off his medications for three weeks.  He endorses SI, auditory  Hallucinations, increased depression and anxiety.  Patient also endorses relapsing on crack cocaine and alochol.           Discharge Plan 1. Will you be returning to the same living situation after discharge?   Yes: No:      If no, what is your plan?    Patient is uncertain where he will live at discharge.       2. Would you like a referral for services when you are discharged? Yes:     If yes, for what services?  No:       Yes, patient is requesting referral to residential treatment and is considering [facility].       Summary and Recommendations (to be completed by the evaluator) [name] is a 52 years old African American male admitted with chronic paranoid schizophrenia.  He will benefit from crisis stabilization, evaluation for medication, psycho-education groups for coping skills development, group therapy and case management for discharge planning.                        Signature:  Wynn Banker, 05/26/2013 10:36 AM

## 2013-05-26 NOTE — ED Notes (Signed)
Pt states he is really here for detox from crack cocaine.

## 2013-05-26 NOTE — [facility] Suicide Risk Assessment (Signed)
[facility] INPATIENT:  Family/Significant Other Suicide Prevention Education  Suicide Prevention Education:  Contact Attempts:  Clide Cliff, [phone]: has been identified by the patient as the family member/significant other with whom the patient will be residing, and identified as the person(s) who will aid the patient in the event of a mental health crisis.  With written consent from the patient, two attempts were made to provide suicide prevention education, prior to and/or following the patient's discharge.  We were unsuccessful in providing suicide prevention education.  A suicide education pamphlet was given to the patient to share with family/significant other.  Date and time of first attempt: 05/26/13 @  12:21 PM Date and time of second attempt:  Wynn Banker 05/26/2013, 12:20 PM

## 2013-05-26 NOTE — ED Provider Notes (Signed)
Medical screening examination/treatment/procedure(s) were performed by non-physician practitioner and as supervising physician I was immediately available for consultation/collaboration.  EKG Interpretation   None        Katy Brickell K Karletta Millay-Rasch, MD 05/26/13 [phone]

## 2013-05-26 NOTE — Tx Team (Signed)
Initial Interdisciplinary Treatment Plan  PATIENT STRENGTHS: (choose at least two) Average or above average intelligence Capable of independent living  PATIENT STRESSORS: Health problems Substance abuse   PROBLEM LIST: Problem List/Patient Goals Date to be addressed Date deferred Reason deferred Estimated date of resolution  Substance abuse 05/26/2013     Depression 05/26/2013                                                DISCHARGE CRITERIA:  Ability to meet basic life and health needs Need for constant or close observation no longer present Reduction of life-threatening or endangering symptoms to within safe limits Withdrawal symptoms are absent or subacute and managed without 24-hour nursing intervention  PRELIMINARY DISCHARGE PLAN: Attend 12-step recovery group Placement in alternative living arrangements  PATIENT/FAMIILY INVOLVEMENT: This treatment plan has been presented to and reviewed with the patient, [name].  The patient and family have been given the opportunity to ask questions and make suggestions.  [name] 05/26/2013, 11:26 AM

## 2013-05-27 DIAGNOSIS — F1410 Cocaine abuse, uncomplicated: Secondary | ICD-10-CM

## 2013-05-27 DIAGNOSIS — F200 Paranoid schizophrenia: Principal | ICD-10-CM

## 2013-05-27 DIAGNOSIS — F1994 Other psychoactive substance use, unspecified with psychoactive substance-induced mood disorder: Secondary | ICD-10-CM

## 2013-05-27 LAB — GLUCOSE, CAPILLARY
Glucose-Capillary: 164 mg/dL — ABNORMAL HIGH (ref 70–99)
Glucose-Capillary: 163 mg/dL — ABNORMAL HIGH (ref 70–99)

## 2013-05-27 NOTE — Progress Notes (Signed)
D: pt asleep, no signs of distress or labored breathing A: q 15 min safety checks R: pt. Remains safe on unit 

## 2013-05-27 NOTE — [facility] Group Notes (Signed)
[facility] Group Notes:  (Nursing/MHT/Case Management/Adjunct)  Date:  05/27/2013  Time:  0915  Type of Therapy:  Psychoeducational Skills  Participation Level:  Active  Participation Quality:  Resistant  Affect:  Irritable  Cognitive:  Oriented  Insight:  Lacking  Engagement in Group:  Limited  Modes of Intervention:  Support  Summary of Progress/Problems:Morning Wellness group  Manuela Schwartz [facility] 05/27/2013, 10:42 AM

## 2013-05-27 NOTE — Progress Notes (Signed)
Patient had asked writer for a snack by the time that that writer could locate patient again in the day room another staff member had already given patient a strawberry ice cream and a vanilla ice cream that patient was eating. Writer also noticed that patient had a package of chips ahoy cookies. Patient asked Clinical research associate for a milk when Clinical research associate told patient that no milk was in the refrigerator patient then asked for another ice cream and writer told the patient that "I can not give that to you at this time". Patient began to rant saying " That's the reason why I dont like coming to this hospital they always telling me what i can and can not have. How does she even know what i had any ways?"

## 2013-05-27 NOTE — Progress Notes (Signed)
Patient ID: [name], male   DOB: Nov 27, 1960, 52 y.o.   MRN: [id number]  D: Patient pleasant on approach today. Some irritability noted but patient reporting that he needs more sleep. Can be flirtatious with staff at times but no other inappropriate behavior noted. Reports depressed mood but no SI at present. A: Staff will monitor on q 15 minute checks, follow treatment plan, and give meds as ordered. R: Cooperative on unit

## 2013-05-27 NOTE — Progress Notes (Signed)
[facility] MD Progress Note  05/27/2013 11:04 AM [name]  MRN:  [id number] Subjective:  [name] continues to stay in bed when not up for meals. Today he denies SI/HI or AVH. States he has had no withdrawal symptoms. When asked if he felt ready to go home tomorrow he readily agreed. Diagnosis:   DSM5: Schizophrenia Disorders: Schizophrenia, paranoid type  Obsessive-Compulsive Disorders:  Trauma-Stressor Disorders:  Substance/Addictive Disorders: Cocaine abuse  Depressive Disorders:  AXIS I: SIMD  AXIS II: Cluster B Traits  AXIS III:  Past Medical History   Diagnosis  Date   .  Diabetes mellitus without complication    .  Hypertension    .  Schizophrenia     AXIS IV: housing problems and problems related to social environment  AXIS V: 51-60 moderate symptoms  ADL's:  Intact  Sleep: Good  Appetite:  Good  Suicidal Ideation:  denies Homicidal Ideation:  denies AEB (as evidenced by):  Psychiatric Specialty Exam: Review of Systems  Constitutional: Negative.  Negative for fever, chills, weight loss, malaise/fatigue and diaphoresis.  HENT: Negative for congestion and sore throat.   Eyes: Negative for blurred vision, double vision and photophobia.  Respiratory: Negative for cough, shortness of breath and wheezing.   Cardiovascular: Negative for chest pain, palpitations and PND.  Gastrointestinal: Negative for heartburn, nausea, vomiting, abdominal pain, diarrhea and constipation.  Musculoskeletal: Negative for falls, joint pain and myalgias.  Neurological: Negative for dizziness, tingling, tremors, sensory change, speech change, focal weakness, seizures, loss of consciousness, weakness and headaches.  Endo/Heme/Allergies: Negative for polydipsia. Does not bruise/bleed easily.  Psychiatric/Behavioral: Negative for depression, suicidal ideas, hallucinations, memory loss and substance abuse. The patient is not nervous/anxious and does not have insomnia.     Blood pressure  123/77, pulse 96, temperature 97.5 F (36.4 C), temperature source Oral, resp. rate 19, height 5' 8.5" (1.74 m), weight 83.462 kg (184 lb).Body mass index is 27.57 kg/(m^2).  General Appearance: Fairly Groomed  Patent attorney::  Minimal  Speech:  Clear and Coherent  Volume:  Normal  Mood:  Irritable  Affect:  Congruent  Thought Process:  Goal Directed  Orientation:  Full (Time, Place, and Person)  Thought Content:  WDL  Suicidal Thoughts:  No  Homicidal Thoughts:  No  Memory:  Immediate;   Fair  Judgement:  Impaired  Insight:  Shallow  Psychomotor Activity:  Normal  Concentration:  Fair  Recall:  Fair  Akathisia:  No  Handed:  Right  AIMS (if indicated):     Assets:  Resilience  Sleep:      Current Medications: Current Facility-Administered Medications  Medication Dose Route Frequency Provider Last Rate Last Dose  . acetaminophen (TYLENOL) tablet 650 mg  650 mg Oral Q6H PRN Sanjuana Kava, NP   650 mg at 05/27/13 0636  . alum & mag hydroxide-simeth (MAALOX/MYLANTA) 200-200-20 MG/5ML suspension 30 mL  30 mL Oral Q4H PRN Sanjuana Kava, NP      . cloNIDine (CATAPRES) tablet 0.2 mg  0.2 mg Oral BID Sanjuana Kava, NP   0.2 mg at 05/27/13 0829  . fluPHENAZine (PROLIXIN) tablet 5 mg  5 mg Oral Daily Sanjuana Kava, NP   5 mg at 05/27/13 0829  . fluPHENAZine decanoate (PROLIXIN) injection 25 mg  25 mg Intramuscular Q14 Days Sanjuana Kava, NP   25 mg at 05/26/13 1338  . gabapentin (NEURONTIN) capsule 100 mg  100 mg Oral BID Sanjuana Kava, NP   100 mg at 05/27/13  2130  . hydrOXYzine (ATARAX/VISTARIL) tablet 25 mg  25 mg Oral Q6H PRN Sanjuana Kava, NP      . influenza vac split quadrivalent PF (FLUARIX) injection 0.5 mL  0.5 mL Intramuscular Tomorrow-1000 Nehemiah Settle, MD      . insulin aspart (novoLOG) injection 0-15 Units  0-15 Units Subcutaneous TID WC Sanjuana Kava, NP   2 Units at 05/27/13 [phone]  . insulin aspart (novoLOG) injection 4 Units  4 Units Subcutaneous TID WC Sanjuana Kava, NP   4 Units at 05/27/13 [phone]  . insulin glargine (LANTUS) injection 10 Units  10 Units Subcutaneous QHS Sanjuana Kava, NP   10 Units at 05/26/13 2243  . isosorbide mononitrate (IMDUR) 24 hr tablet 30 mg  30 mg Oral Daily Sanjuana Kava, NP   30 mg at 05/27/13 0829  . lisinopril (PRINIVIL,ZESTRIL) tablet 40 mg  40 mg Oral Daily Sanjuana Kava, NP   40 mg at 05/27/13 6295  . magnesium hydroxide (MILK OF MAGNESIA) suspension 30 mL  30 mL Oral Daily PRN Sanjuana Kava, NP      . metFORMIN (GLUCOPHAGE) tablet 500 mg  500 mg Oral BID WC Sanjuana Kava, NP   500 mg at 05/27/13 0829  . nicotine (NICODERM CQ - dosed in mg/24 hours) patch 21 mg  21 mg Transdermal Q0600 Sanjuana Kava, NP   21 mg at 05/27/13 0634  . pneumococcal 23 valent vaccine (PNU-IMMUNE) injection 0.5 mL  0.5 mL Intramuscular Tomorrow-1000 Nehemiah Settle, MD      . traZODone (DESYREL) tablet 100 mg  100 mg Oral QHS PRN Sanjuana Kava, NP        Lab Results:  Results for orders placed during the hospital encounter of 05/26/13 (from the past 48 hour(s))  GLUCOSE, CAPILLARY     Status: Abnormal   Collection Time    05/26/13 12:12 PM      Result Value Range   Glucose-Capillary 175 (*) 70 - 99 mg/dL   Comment 1 Notify RN    GLUCOSE, CAPILLARY     Status: Abnormal   Collection Time    05/26/13  4:47 PM      Result Value Range   Glucose-Capillary 150 (*) 70 - 99 mg/dL   Comment 1 Notify RN    GLUCOSE, CAPILLARY     Status: Abnormal   Collection Time    05/26/13  8:43 PM      Result Value Range   Glucose-Capillary 179 (*) 70 - 99 mg/dL   Comment 1 Notify RN    GLUCOSE, CAPILLARY     Status: Abnormal   Collection Time    05/27/13  6:05 AM      Result Value Range   Glucose-Capillary 125 (*) 70 - 99 mg/dL    Physical Findings: AIMS:  , ,  ,  ,    CIWA:    COWS:     Treatment Plan Summary: Daily contact with patient to assess and evaluate symptoms and progress in treatment Medication management  Plan: 1.  Continue crisis management and stabilization. 2. Medication management to reduce current symptoms to base line and improve patient's overall level of functioning 3. Treat health problems as indicated. 4. Develop treatment plan to decrease risk of relapse upon discharge and the need for     readmission. 5. Psycho-social education regarding relapse prevention and self care. 6. Health care follow up as needed for medical problems. 7. Continue home  medications where appropriate. 8. Will give LA IM psychotropic tomorrow and D/C out.  Medical Decision Making Problem Points:  Established problem, stable/improving (1) Data Points:  Review or order medicine tests (1)  I certify that inpatient services furnished can reasonably be expected to improve the patient's condition.  Rona Ravens. Mashburn RPAC 3:19 PM 05/27/2013 Agree with assessment and plan Madie Reno A. Dub Mikes, M.D.

## 2013-05-28 LAB — GLUCOSE, CAPILLARY
Glucose-Capillary: 145 mg/dL — ABNORMAL HIGH (ref 70–99)
Glucose-Capillary: 174 mg/dL — ABNORMAL HIGH (ref 70–99)

## 2013-05-28 MED ORDER — FLUPHENAZINE HCL 5 MG PO TABS: 5.0000 mg | ORAL_TABLET | Freq: Every day | ORAL | Status: DC

## 2013-05-28 MED ORDER — CLONIDINE HCL 0.2 MG PO TABS: 0.2000 mg | ORAL_TABLET | Freq: Two times a day (BID) | ORAL | Status: DC

## 2013-05-28 MED ORDER — GABAPENTIN 100 MG PO CAPS: 100.0000 mg | ORAL_CAPSULE | Freq: Two times a day (BID) | ORAL | Status: DC

## 2013-05-28 NOTE — Progress Notes (Signed)
DAR- Update note D: Pt observed sleeping in bed with eyes closed. RR even and unlabored. No distress noted  .  A: Q 15 minute checks were done for safety.  R: safety maintained on unit.  

## 2013-05-28 NOTE — Progress Notes (Signed)
Adult Psychoeducational Group Note  Date:  05/28/2013 Time:  1:15PM  Group Topic/Focus:  Therapeutic Activity  Participation Level:  Did Not Attend  Additional Comments: Pt did not attend the group session.   Zacarias Pontes R 05/28/2013, 2:29 PM

## 2013-05-28 NOTE — Progress Notes (Signed)
Nrsg DC MD completed DC and SRA orders / notes in pt's chart. Pt denies SI, HI and / or presence of audit, vis, tactile halluc. Pt is given DC AVS, he states he understands these instructions, he states he can and will keep f/u DC palns. HE is given sample meds from the pharmacy as well as prescriptions. All belongings are returned to him and he is escorted to bldg entrance and DC'd.

## 2013-05-28 NOTE — [facility] Suicide Risk Assessment (Addendum)
Suicide Risk Assessment  Discharge Assessment     Demographic Factors:  Male, Divorced or widowed, Low socioeconomic status and Unemployed  Mental Status Per Nursing Assessment::   On Admission:  NA  Current Mental Status by Physician: Psychiatric Specialty Exam: ROS  Blood pressure 148/86, pulse 90, temperature 98.3 F (36.8 C), temperature source Oral, resp. rate 24, height 5' 8.5" (1.74 m), weight 83.462 kg (184 lb).Body mass index is 27.57 kg/(m^2).  General Appearance: Casual and Well Groomed  Eye Contact::  Good  Speech:  Clear and Coherent and Normal Rate  Volume:  Normal  Mood:  Euthymic  Affect:  Congruent  Thought Process:  Coherent  Orientation:  Full (Time, Place, and Person)  Thought Content:  WDL  Suicidal Thoughts:  No  Homicidal Thoughts:  No  Memory:  Immediate;   Good Recent;   Good Remote;   Good  Judgement:  Intact  Insight:  Lacking  Psychomotor Activity:  Normal  Concentration:  Good  Recall:  Poor  Akathisia:  No  Handed:  Right  AIMS (if indicated):   As noted in chart  Assets:  Architect Housing Resilience Social Support Transportation  Sleep:  Number of Hours: 6.75     Loss Factors: Decrease in vocational status  Historical Factors: Family history of mental illness or substance abuse  Risk Reduction Factors:   Living with another person, especially a relative and Positive social support Going to Rehab through [facility]. Continued Clinical Symptoms:  Alcohol/Substance Abuse/Dependencies Schizophrenia:   Paranoid or undifferentiated type  Cognitive Features That Contribute To Risk:  Thought constriction (tunnel vision)    Suicide Risk:  Minimal: No identifiable suicidal ideation.  Patients presenting with no risk factors but with morbid ruminations; may be classified as minimal risk based on the severity of the depressive symptoms  Discharge Diagnoses:   AXIS I:  Schizophrenia, paranoid  type. Severe Stimulant Use Disorder (Cocaine Dependence) AXIS II:  Cluster B Traits AXIS III:   Past Medical History  Diagnosis Date  . Diabetes mellitus without complication   . Hypertension   . Schizophrenia    AXIS IV:  other psychosocial or environmental problems AXIS V:  51-60 moderate symptoms  Plan Of Care/Follow-up recommendations:  Activity:  Increase as tolerate Diet:  Diabetic Diet Tests:  None Other:  Follow up through [facility] and [name]  Is patient on multiple antipsychotic therapies at discharge:  No   Has Patient had three or more failed trials of antipsychotic monotherapy by history:  No  Recommended Plan for Multiple Antipsychotic Therapies: NA  [name] 05/28/2013, 10:14 AM

## 2013-05-28 NOTE — Progress Notes (Addendum)
[facility] MD Progress Note  05/28/2013 10:04 AM [name]  MRN:  [id number] Subjective:   HPI Comments: [name] is  a 52 y/o male with a past psychiatric history significant for Schizophrenia.   . Location: Patient is on the inpatient unit. He reports he is doing well.  . Quality: The patient reports that he has no current stressors.  In the area of affective symptoms, patient appears euthymic. Patient denies current suicidal ideation, intent, or plan. Patient denies current homicidal ideation, intent, or plan. Patient denies auditory hallucinations. Patient denies visual hallucinations. Patient denies symptoms of paranoia. Patient states sleep is good, with approximately 6.75 hours of sleep per night. Appetite is good. Energy level is good. . Severity: Mild . Duration: 2 weeks ago. . Timing: Was having difficulty around midnight.  Now reports he is doing well.  . Context: Pain and stopping medication. . Modifying factors: Improved with being on medications. Received Prolixin shot and is doing well. He will go back to live with his cousin . Associated signs and symptoms : None  Diagnosis:   DSM5: Schizophrenia Disorders:  Schizophrenia (295.7) Severe Stimulant Use Disorder (Cocaine Dependence) ADL's:  Intact  Sleep: Good  Appetite:  Good  Suicidal Ideation:  Plan:  Patient denies. Intent:  Patient denies. Means:  Patient denies. Homicidal Ideation:  Plan:  Patient denies. Intent:  Patient denies. Means:  Patient denies. AEB (as evidenced by):  Psychiatric Specialty Exam: ROS  Blood pressure 148/86, pulse 90, temperature 98.3 F (36.8 C), temperature source Oral, resp. rate 24, height 5' 8.5" (1.74 m), weight 83.462 kg (184 lb).Body mass index is 27.57 kg/(m^2).  General Appearance: Casual and Well Groomed  Eye Contact::  Good  Speech:  Clear and Coherent and Normal Rate  Volume:  Normal  Mood:  Euthymic  Affect:  Congruent  Thought Process:  Coherent   Orientation:  Full (Time, Place, and Person)  Thought Content:  WDL  Suicidal Thoughts:  No  Homicidal Thoughts:  No  Memory:  Immediate;   Good Recent;   Good Remote;   Good  Judgement:  Intact  Insight:  Lacking  Psychomotor Activity:  Normal  Concentration:  Good  Recall:  Poor  Akathisia:  No  Handed:  Right  AIMS (if indicated):   As noted in chart  Assets:  Architect Housing Resilience Social Support Transportation  Sleep:  Number of Hours: 6.75   Current Medications: Current Facility-Administered Medications  Medication Dose Route Frequency Provider Last Rate Last Dose  . acetaminophen (TYLENOL) tablet 650 mg  650 mg Oral Q6H PRN Sanjuana Kava, NP   650 mg at 05/27/13 0636  . alum & mag hydroxide-simeth (MAALOX/MYLANTA) 200-200-20 MG/5ML suspension 30 mL  30 mL Oral Q4H PRN Sanjuana Kava, NP      . cloNIDine (CATAPRES) tablet 0.2 mg  0.2 mg Oral BID Sanjuana Kava, NP   0.2 mg at 05/28/13 0842  . fluPHENAZine (PROLIXIN) tablet 5 mg  5 mg Oral Daily Sanjuana Kava, NP   5 mg at 05/28/13 0842  . fluPHENAZine decanoate (PROLIXIN) injection 25 mg  25 mg Intramuscular Q14 Days Sanjuana Kava, NP   25 mg at 05/26/13 1338  . gabapentin (NEURONTIN) capsule 100 mg  100 mg Oral BID Sanjuana Kava, NP   100 mg at 05/28/13 3086  . hydrOXYzine (ATARAX/VISTARIL) tablet 25 mg  25 mg Oral Q6H PRN Sanjuana Kava, NP      .  insulin aspart (novoLOG) injection 0-15 Units  0-15 Units Subcutaneous TID WC Sanjuana Kava, NP   2 Units at 05/28/13 0654  . insulin aspart (novoLOG) injection 4 Units  4 Units Subcutaneous TID WC Sanjuana Kava, NP   4 Units at 05/28/13 0654  . insulin glargine (LANTUS) injection 10 Units  10 Units Subcutaneous QHS Sanjuana Kava, NP   10 Units at 05/27/13 2118  . isosorbide mononitrate (IMDUR) 24 hr tablet 30 mg  30 mg Oral Daily Sanjuana Kava, NP   30 mg at 05/28/13 0842  . lisinopril (PRINIVIL,ZESTRIL) tablet 40 mg  40 mg Oral  Daily Sanjuana Kava, NP   40 mg at 05/28/13 0842  . magnesium hydroxide (MILK OF MAGNESIA) suspension 30 mL  30 mL Oral Daily PRN Sanjuana Kava, NP      . metFORMIN (GLUCOPHAGE) tablet 500 mg  500 mg Oral BID WC Sanjuana Kava, NP   500 mg at 05/28/13 0842  . nicotine (NICODERM CQ - dosed in mg/24 hours) patch 21 mg  21 mg Transdermal Q0600 Sanjuana Kava, NP   21 mg at 05/27/13 0634  . traZODone (DESYREL) tablet 100 mg  100 mg Oral QHS PRN Sanjuana Kava, NP        Lab Results:  Results for orders placed during the hospital encounter of 05/26/13 (from the past 48 hour(s))  GLUCOSE, CAPILLARY     Status: Abnormal   Collection Time    05/26/13 12:12 PM      Result Value Range   Glucose-Capillary 175 (*) 70 - 99 mg/dL   Comment 1 Notify RN    GLUCOSE, CAPILLARY     Status: Abnormal   Collection Time    05/26/13  4:47 PM      Result Value Range   Glucose-Capillary 150 (*) 70 - 99 mg/dL   Comment 1 Notify RN    GLUCOSE, CAPILLARY     Status: Abnormal   Collection Time    05/26/13  8:43 PM      Result Value Range   Glucose-Capillary 179 (*) 70 - 99 mg/dL   Comment 1 Notify RN    GLUCOSE, CAPILLARY     Status: Abnormal   Collection Time    05/27/13  6:05 AM      Result Value Range   Glucose-Capillary 125 (*) 70 - 99 mg/dL  GLUCOSE, CAPILLARY     Status: Abnormal   Collection Time    05/27/13 11:35 AM      Result Value Range   Glucose-Capillary 164 (*) 70 - 99 mg/dL   Comment 1 Notify RN    GLUCOSE, CAPILLARY     Status: Abnormal   Collection Time    05/27/13  4:46 PM      Result Value Range   Glucose-Capillary 163 (*) 70 - 99 mg/dL  GLUCOSE, CAPILLARY     Status: Abnormal   Collection Time    05/27/13  9:00 PM      Result Value Range   Glucose-Capillary 176 (*) 70 - 99 mg/dL  GLUCOSE, CAPILLARY     Status: Abnormal   Collection Time    05/28/13  6:40 AM      Result Value Range   Glucose-Capillary 145 (*) 70 - 99 mg/dL    Physical Findings: AIMS:  , ,  ,  ,    CIWA:     COWS:     Treatment Plan Summary: Patient is doing well.  Will discharge today.   Plan: Discharge home.  Medical Decision Making Problem Points:  Established problem, stable/improving (1) and Review of psycho-social stressors (1) Data Points:  Order Aims Assessment (2) Review or order clinical lab tests (1) Review of medication regiment & side effects (2)  I certify that inpatient services furnished can reasonably be expected to improve the patient's condition.   Ardenia Stiner 05/28/2013, 10:04 AM

## 2013-05-28 NOTE — Progress Notes (Signed)
[facility] Adult Case Management Discharge Plan :  Will you be returning to the same living situation after discharge: Yes,  home At discharge, do you have transportation home?:Yes,  pt's cousin or bus Do you have the ability to pay for your medications:Yes,  Medicare  Release of information consent forms completed and in the chart;  Patient's signature needed at discharge.  Patient to Follow up at: Follow-up Information   Follow up with [facility] On 06/01/2013. (Tuesday, June 01, 2013 for an assessment.  [facility] is not certain a bed will be available on Tuesday but will work with you if you meet criteria and a bed is available.)    Contact information:   [address] [place], [place]   [address]  [phone]      Patient denies SI/HI:   Yes,  during group/self report.    Safety Planning and Suicide Prevention discussed:  Yes,  SPE completed with pt's cousin. SPI pamphlet provided to pt to share with his support network.   Smart, [name] LCSWA 05/28/2013, 12:08 PM

## 2013-05-28 NOTE — Discharge Summary (Signed)
Physician Discharge Summary Note  Patient:  [name] is an 52 y.o., male MRN:  [id number] DOB:  16-Jan-1961 Patient phone:  [phone] (home)  Patient address:   No address on file.,   Date of Admission:  05/26/2013 Date of Discharge: 05/28/2013   Reason for Admission:  schizophrenia  Discharge Diagnoses: Active Problems:   Schizophrenia   Cocaine dependence  ROS  DSM5: Schizophrenia Disorders: Schizophrenia, paranoid type  Obsessive-Compulsive Disorders:  Trauma-Stressor Disorders:  Substance/Addictive Disorders: Cocaine abuse  Depressive Disorders:  AXIS I: SIMD  AXIS II: Cluster B Traits  AXIS III:  Past Medical History   Diagnosis  Date   .  Diabetes mellitus without complication    .  Hypertension    .  Schizophrenia     AXIS IV: housing problems and problems related to social environment  AXIS V: 51-60 moderate symptoms Level of Care:  OP  Hospital Course: Aria Pickrell was admitted after he presented to the [facility] reporting foot pain. He has a long history of mental illness and substance abuse. He stated that he had been using cocaine and drinking alcohol and needed help. His UDS was positive for cocaine and his BAL was <11,      Gordan was admitted to [facility] and evaluated upon arrival. He denies SI/HI/or AVH, and states that he is here to get help for his drug use. He was restarted on all of his regular medications including oral prolixin and he was also given his Prolixin Decanoate injection which he usually gets at [place],      Lilburn was not interested in attending any groups and remained in bed most of the time. He ate well and slept well. He stated he had no withdrawal symptoms and was ready to go home today.      He was not interested in pursuing any further treatment upon discharge and felt he was ready to leave. He denied SI/HI or AVH. He denied any symptoms of withdrawal.  He was encouraged to keep his regular appointment at [facility].  He  was given samples of his psych meds and a prescription for 1 month. He was not interested in starting his regular home medication including his blood pressure medications or his diabetic medication. He had not been taking any of them prior to this admission.  Consults:  None  Significant Diagnostic Studies:  labs: CBC, CMP, UA, UDS, CBGs  Discharge Vitals:   Blood pressure 148/86, pulse 90, temperature 98.3 F (36.8 C), temperature source Oral, resp. rate 24, height 5' 8.5" (1.74 m), weight 83.462 kg (184 lb). Body mass index is 27.57 kg/(m^2). Lab Results:   Results for orders placed during the hospital encounter of 05/26/13 (from the past 72 hour(s))  GLUCOSE, CAPILLARY     Status: Abnormal   Collection Time    05/26/13 12:12 PM      Result Value Range   Glucose-Capillary 175 (*) 70 - 99 mg/dL   Comment 1 Notify RN    GLUCOSE, CAPILLARY     Status: Abnormal   Collection Time    05/26/13  4:47 PM      Result Value Range   Glucose-Capillary 150 (*) 70 - 99 mg/dL   Comment 1 Notify RN    GLUCOSE, CAPILLARY     Status: Abnormal   Collection Time    05/26/13  8:43 PM      Result Value Range   Glucose-Capillary 179 (*) 70 - 99 mg/dL   Comment 1 Notify  RN    GLUCOSE, CAPILLARY     Status: Abnormal   Collection Time    05/27/13  6:05 AM      Result Value Range   Glucose-Capillary 125 (*) 70 - 99 mg/dL  GLUCOSE, CAPILLARY     Status: Abnormal   Collection Time    05/27/13 11:35 AM      Result Value Range   Glucose-Capillary 164 (*) 70 - 99 mg/dL   Comment 1 Notify RN    GLUCOSE, CAPILLARY     Status: Abnormal   Collection Time    05/27/13  4:46 PM      Result Value Range   Glucose-Capillary 163 (*) 70 - 99 mg/dL  GLUCOSE, CAPILLARY     Status: Abnormal   Collection Time    05/27/13  9:00 PM      Result Value Range   Glucose-Capillary 176 (*) 70 - 99 mg/dL  GLUCOSE, CAPILLARY     Status: Abnormal   Collection Time    05/28/13  6:40 AM      Result Value Range    Glucose-Capillary 145 (*) 70 - 99 mg/dL    Physical Findings: AIMS: Facial and Oral Movements Muscles of Facial Expression: None, normal Lips and Perioral Area: None, normal Jaw: None, normal Tongue: None, normal,Extremity Movements Upper (arms, wrists, hands, fingers): None, normal Lower (legs, knees, ankles, toes): None, normal, Trunk Movements Neck, shoulders, hips: None, normal, Overall Severity Severity of abnormal movements (highest score from questions above): None, normal Incapacitation due to abnormal movements: None, normal Patient's awareness of abnormal movements (rate only patient's report): No Awareness, Dental Status Current problems with teeth and/or dentures?: No Does patient usually wear dentures?: No  CIWA:    COWS:     Psychiatric Specialty Exam: See Psychiatric Specialty Exam and Suicide Risk Assessment completed by Attending Physician prior to discharge.  Discharge destination:  Home  Is patient on multiple antipsychotic therapies at discharge:  No   Has Patient had three or more failed trials of antipsychotic monotherapy by history:  No  Recommended Plan for Multiple Antipsychotic Therapies: NA  Discharge Orders   Future Orders Complete By Expires   Diet - low sodium heart healthy  As directed    Discharge instructions  As directed    Comments:     Take all of your medications as directed. Be sure to keep all of your follow up appointments.  If you are unable to keep your follow up appointment, call your Doctor's office to let them know, and reschedule.  Make sure that you have enough medication to last until your appointment. Be sure to get plenty of rest. Going to bed at the same time each night will help. Try to avoid sleeping during the day.  Increase your activity as tolerated. Regular exercise will help you to sleep better and improve your mental health. Eating a heart healthy diet is recommended. Try to avoid salty or fried foods. Be sure to avoid  all alcohol and illegal drugs.   Increase activity slowly  As directed        Medication List       Indication   cloNIDine 0.2 MG tablet  Commonly known as:  CATAPRES  Take 1 tablet (0.2 mg total) by mouth 2 (two) times daily. For high blood pressure.   Indication:  Hypertensive Urgency     fluPHENAZine 5 MG tablet  Commonly known as:  PROLIXIN  Take 1 tablet (5 mg total) by mouth daily.  For psychosis.   Indication:  Psychosis     gabapentin 100 MG capsule  Commonly known as:  NEURONTIN  Take 1 capsule (100 mg total) by mouth 2 (two) times daily. For anxiety and neuropathic pain.   Indication:  Neuropathic Pain           Follow-up Information   Follow up with [facility] On 06/01/2013. (Tuesday, June 01, 2013 for an assessment.  [facility] is not certain a bed will be available on Tuesday but will work with you if you meet criteria and a bed is available.)    Contact information:   [address] [place], [place]   [address]  [phone]      Follow-up recommendations:   Activities: Resume activity as tolerated. Diet: Heart healthy low sodium diet Tests: Follow up testing will be determined by your out patient provider. Comments:    Total Discharge Time:  Less than 30 minutes.  Signed: MASHBURN,NEIL 05/28/2013, 11:31 AM  Jacqulyn Cane, M.D.  05/28/2013 8:55 PM

## 2013-06-01 NOTE — Progress Notes (Signed)
Patient Discharge Instructions:  After Visit Summary (AVS):   Faxed to:  06/01/13 Discharge Summary Note:   Faxed to:  06/01/13 Psychiatric Admission Assessment Note:   Faxed to:  06/01/13 Suicide Risk Assessment - Discharge Assessment:   Faxed to:  06/01/13 Faxed/Sent to the Next Level Care provider:  06/01/13 Faxed to [facility] @ [phone]  Jerelene Redden, 06/01/2013, 4:19 PM

## 2013-06-05 ENCOUNTER — Emergency Department (HOSPITAL_COMMUNITY)
Admission: EM | Admit: 2013-06-05 | Discharge: 2013-06-05 | Disposition: A | Discharge status: Home / Self Care | Payer: Medicare Other | Attending: Emergency Medicine | Admitting: Emergency Medicine

## 2013-06-05 ENCOUNTER — Encounter (HOSPITAL_COMMUNITY): Payer: Self-pay | Admitting: Emergency Medicine

## 2013-06-05 DIAGNOSIS — E1149 Type 2 diabetes mellitus with other diabetic neurological complication: Secondary | ICD-10-CM | POA: Insufficient documentation

## 2013-06-05 DIAGNOSIS — Z79899 Other long term (current) drug therapy: Secondary | ICD-10-CM | POA: Insufficient documentation

## 2013-06-05 DIAGNOSIS — F1420 Cocaine dependence, uncomplicated: Secondary | ICD-10-CM | POA: Insufficient documentation

## 2013-06-05 DIAGNOSIS — F17200 Nicotine dependence, unspecified, uncomplicated: Secondary | ICD-10-CM | POA: Insufficient documentation

## 2013-06-05 DIAGNOSIS — E1142 Type 2 diabetes mellitus with diabetic polyneuropathy: Secondary | ICD-10-CM | POA: Insufficient documentation

## 2013-06-05 DIAGNOSIS — F209 Schizophrenia, unspecified: Secondary | ICD-10-CM | POA: Insufficient documentation

## 2013-06-05 DIAGNOSIS — I10 Essential (primary) hypertension: Secondary | ICD-10-CM | POA: Insufficient documentation

## 2013-06-05 DIAGNOSIS — G629 Polyneuropathy, unspecified: Secondary | ICD-10-CM

## 2013-06-05 MED ORDER — GABAPENTIN 300 MG PO CAPS: 300.0000 mg | ORAL_CAPSULE | Freq: Two times a day (BID) | ORAL | Status: DC

## 2013-06-05 MED ORDER — GABAPENTIN 300 MG PO CAPS
300.0000 mg | ORAL_CAPSULE | Freq: Once | ORAL | Status: AC
  Administered 2013-06-05: 300 mg via ORAL
  Filled 2013-06-05: qty 1

## 2013-06-05 NOTE — Progress Notes (Signed)
Weekend CSW received referral from RN Drinda Butts that patient was requesting food, pain medicine, and a bus pass. Weekend CSW met with patient and explained her role. Patient was agreeable to speak. Patient requested a list of substance abuse treatment facilities and informed CSW that he has been using crack cocaine since the age of 9. CSW provided support and encouragement to patient and provided him with a packet of resources including information on the [facility]), outpatient mental health/substance abuse treatment centers, and a bus pass (patient denies having any friends or family to pick him up and complains of foot pain). Patient denies any need for housing or food assistance resources. Weekend CSW signing off, please reconsult if further social work needs arise.   Samuella Bruin, MSW, LCSWA Clinical Social Worker [facility] Emergency Dept. [phone]

## 2013-06-05 NOTE — ED Notes (Signed)
Diet ordered for pt

## 2013-06-05 NOTE — ED Notes (Signed)
Case Mgmt at bedside.

## 2013-06-05 NOTE — ED Provider Notes (Signed)
CSN: [id number]     Arrival date & time 06/05/13  0721 History   First MD Initiated Contact with Patient 06/05/13 [phone]     Chief Complaint  Patient presents with  . Hypertension    HPI  Pt stating that he wants to be seen for hypertension and diabetic neuropathy. 8/10 pain in feet. Lungs clear no edema. Denies any other associated symptoms. sts lightheaded but also sts he smoked crack last night  Past Medical History  Diagnosis Date  . Diabetes mellitus without complication   . Hypertension   . Schizophrenia    History reviewed. No pertinent past surgical history. History reviewed. No pertinent family history. History  Substance Use Topics  . Smoking status: Current Every Day Smoker -- 2.00 packs/day    Types: Cigarettes  . Smokeless tobacco: Current User  . Alcohol Use: Yes    Review of Systems All other systems reviewed and are negative Allergies  Haldol  Home Medications   Current Outpatient Rx  Name  Route  Sig  Dispense  Refill  . cloNIDine (CATAPRES) 0.2 MG tablet   Oral   Take 1 tablet (0.2 mg total) by mouth 2 (two) times daily. For high blood pressure.   60 tablet   0   . fluPHENAZine (PROLIXIN) 5 MG tablet   Oral   Take 1 tablet (5 mg total) by mouth daily. For psychosis.   30 tablet   0   . gabapentin (NEURONTIN) 300 MG capsule   Oral   Take 1 capsule (300 mg total) by mouth 2 (two) times daily. For anxiety and neuropathic pain.  You may take 2 capsules at night if one capsule is not adequate to control pain.   60 capsule   0    BP 160/91  Pulse 89  Temp(Src) 97.9 F (36.6 C) (Oral)  Resp 28  SpO2 100% Physical Exam  Nursing note and vitals reviewed. Constitutional: He is oriented to person, place, and time. He appears well-developed and well-nourished. No distress.  HENT:  Head: Normocephalic and atraumatic.  Eyes: Pupils are equal, round, and reactive to light.  Neck: Normal range of motion.  Cardiovascular: Normal rate and intact distal  pulses.   Pulmonary/Chest: No respiratory distress.  Abdominal: Normal appearance. He exhibits no distension.  Musculoskeletal: Normal range of motion.  Neurological: He is alert and oriented to person, place, and time. No cranial nerve deficit.  Skin: Skin is warm and dry. No rash noted.  Psychiatric: He has a normal mood and affect. His behavior is normal.    ED Course  Procedures (including critical care time) Labs Review Labs Reviewed - No data to display Imaging Review No results found.   I discussed the case with social worker who stated that he would not be eligible for inpatient detox for only cocaine.  Patient denies using alcohol or having any homicidal or suicidal thoughts.  Patient is scheduled for detox clinics and will followup. EKG Interpretation    Date/Time:  Saturday June 05 2013 07:30:58 EST Ventricular Rate:  90 PR Interval:  135 QRS Duration: 83 QT Interval:  395 QTC Calculation: 483 R Axis:   13 Text Interpretation:  Sinus rhythm Anteroseptal infarct, old Nonspecific T abnormalities, lateral leads No significant change since last tracing Confirmed by Junior Kenedy  MD, Atanacio Melnyk ([id number]) on 06/05/2013 7:51:27 AM            MDM   1. Peripheral neuropathy   2. Cocaine dependence   3. Essential hypertension,  benign        Nelia Shi, MD 06/06/13 [phone]

## 2013-06-05 NOTE — ED Notes (Signed)
Pt sts that he wants some tylenol for his pain because he cant afford it. sts also he wants something for his BP. sts wants something warm to eat and a bus pass. MD aware.

## 2013-06-05 NOTE — ED Notes (Signed)
BP 160/96. Pulse 100. CBG 192. Pt stating that he wants to be seen for hypertension and diabetic neuropathy. 8/10 pain in feet. Lungs clear no edema. Denies any other associated symptoms. sts lightheaded but also sts he smoked crack last night.

## 2013-08-04 ENCOUNTER — Inpatient Hospital Stay (HOSPITAL_COMMUNITY)
Admission: AD | Admit: 2013-08-04 | Discharge: 2013-08-10 | DRG: 885 | Disposition: A | Discharge status: Home / Self Care | Payer: Medicare Other | Source: Intra-hospital | Attending: Psychiatry | Admitting: Psychiatry

## 2013-08-04 ENCOUNTER — Emergency Department (EMERGENCY_DEPARTMENT_HOSPITAL)
Admission: EM | Admit: 2013-08-04 | Discharge: 2013-08-04 | Disposition: A | Discharge status: Other Acute Inpatient Hospital | Payer: Medicare Other | Source: Home / Self Care

## 2013-08-04 ENCOUNTER — Encounter (HOSPITAL_COMMUNITY): Payer: Self-pay | Admitting: Behavioral Health

## 2013-08-04 ENCOUNTER — Encounter (HOSPITAL_COMMUNITY): Payer: Self-pay | Admitting: Emergency Medicine

## 2013-08-04 DIAGNOSIS — F209 Schizophrenia, unspecified: Secondary | ICD-10-CM | POA: Diagnosis present

## 2013-08-04 DIAGNOSIS — I10 Essential (primary) hypertension: Secondary | ICD-10-CM | POA: Diagnosis present

## 2013-08-04 DIAGNOSIS — Z91199 Patient's noncompliance with other medical treatment and regimen due to unspecified reason: Secondary | ICD-10-CM

## 2013-08-04 DIAGNOSIS — F17200 Nicotine dependence, unspecified, uncomplicated: Secondary | ICD-10-CM | POA: Diagnosis present

## 2013-08-04 DIAGNOSIS — F200 Paranoid schizophrenia: Principal | ICD-10-CM | POA: Diagnosis present

## 2013-08-04 DIAGNOSIS — E119 Type 2 diabetes mellitus without complications: Secondary | ICD-10-CM

## 2013-08-04 DIAGNOSIS — Z79899 Other long term (current) drug therapy: Secondary | ICD-10-CM | POA: Insufficient documentation

## 2013-08-04 DIAGNOSIS — Z9119 Patient's noncompliance with other medical treatment and regimen: Secondary | ICD-10-CM

## 2013-08-04 DIAGNOSIS — F1910 Other psychoactive substance abuse, uncomplicated: Secondary | ICD-10-CM

## 2013-08-04 DIAGNOSIS — G4700 Insomnia, unspecified: Secondary | ICD-10-CM | POA: Diagnosis present

## 2013-08-04 DIAGNOSIS — M79609 Pain in unspecified limb: Secondary | ICD-10-CM | POA: Insufficient documentation

## 2013-08-04 DIAGNOSIS — R5383 Other fatigue: Secondary | ICD-10-CM

## 2013-08-04 DIAGNOSIS — Z5900 Homelessness unspecified: Secondary | ICD-10-CM

## 2013-08-04 DIAGNOSIS — R5381 Other malaise: Secondary | ICD-10-CM | POA: Diagnosis present

## 2013-08-04 DIAGNOSIS — F411 Generalized anxiety disorder: Secondary | ICD-10-CM | POA: Insufficient documentation

## 2013-08-04 DIAGNOSIS — F1420 Cocaine dependence, uncomplicated: Secondary | ICD-10-CM | POA: Insufficient documentation

## 2013-08-04 DIAGNOSIS — F489 Nonpsychotic mental disorder, unspecified: Secondary | ICD-10-CM | POA: Insufficient documentation

## 2013-08-04 DIAGNOSIS — R443 Hallucinations, unspecified: Secondary | ICD-10-CM

## 2013-08-04 DIAGNOSIS — G8929 Other chronic pain: Secondary | ICD-10-CM | POA: Insufficient documentation

## 2013-08-04 LAB — RAPID URINE DRUG SCREEN, HOSP PERFORMED
Amphetamines: NOT DETECTED
Barbiturates: NOT DETECTED
Benzodiazepines: NOT DETECTED
Cocaine: POSITIVE — AB
Opiates: NOT DETECTED
Tetrahydrocannabinol: NOT DETECTED

## 2013-08-04 LAB — CBC
HEMATOCRIT: 31.6 % — AB (ref 39.0–52.0)
HEMOGLOBIN: 10.3 g/dL — AB (ref 13.0–17.0)
MCH: 28.3 pg (ref 26.0–34.0)
MCHC: 32.6 g/dL (ref 30.0–36.0)
MCV: 86.8 fL (ref 78.0–100.0)
Platelets: 188 10*3/uL (ref 150–400)
RBC: 3.64 MIL/uL — ABNORMAL LOW (ref 4.22–5.81)
RDW: 12.6 % (ref 11.5–15.5)
WBC: 6.3 10*3/uL (ref 4.0–10.5)

## 2013-08-04 LAB — COMPREHENSIVE METABOLIC PANEL
ALT: 10 U/L (ref 0–53)
AST: 32 U/L (ref 0–37)
Albumin: 4.2 g/dL (ref 3.5–5.2)
Alkaline Phosphatase: 76 U/L (ref 39–117)
BUN: 22 mg/dL (ref 6–23)
CO2: 29 mEq/L (ref 19–32)
Calcium: 9.6 mg/dL (ref 8.4–10.5)
Chloride: 104 mEq/L (ref 96–112)
Creatinine, Ser: 0.84 mg/dL (ref 0.50–1.35)
GFR calc Af Amer: >90 mL/min (ref >90)
GFR calc non Af Amer: >90 mL/min (ref >90)
Glucose, Bld: 173 mg/dL — ABNORMAL HIGH (ref 70–99)
Potassium: 3.6 mEq/L — ABNORMAL LOW (ref 3.7–5.3)
Sodium: 144 mEq/L (ref 137–147)
Total Bilirubin: 0.6 mg/dL (ref 0.3–1.2)
Total Protein: 7.6 g/dL (ref 6.0–8.3)

## 2013-08-04 LAB — GLUCOSE, CAPILLARY
GLUCOSE-CAPILLARY: 154 mg/dL — AB (ref 70–99)
GLUCOSE-CAPILLARY: 121 mg/dL — AB (ref 70–99)
GLUCOSE-CAPILLARY: 156 mg/dL — AB (ref 70–99)
GLUCOSE-CAPILLARY: 139 mg/dL — AB (ref 70–99)
Glucose-Capillary: 190 mg/dL — ABNORMAL HIGH (ref 70–99)
Glucose-Capillary: 143 mg/dL — ABNORMAL HIGH (ref 70–99)

## 2013-08-04 LAB — ETHANOL: Alcohol, Ethyl (B): <11 mg/dL (ref 0–11)

## 2013-08-04 MED ORDER — ZOLPIDEM TARTRATE 5 MG PO TABS
5.0000 mg | ORAL_TABLET | Freq: Every evening | ORAL | Status: DC | PRN
Start: 2013-08-04 — End: 2013-08-04

## 2013-08-04 MED ORDER — ACETAMINOPHEN 325 MG PO TABS
650.0000 mg | ORAL_TABLET | Freq: Four times a day (QID) | ORAL | Status: DC | PRN
  Administered 2013-08-04 – 2013-08-10 (×7): 650 mg via ORAL
  Filled 2013-08-04 (×8): qty 2

## 2013-08-04 MED ORDER — LORAZEPAM 1 MG PO TABS: 1.0000 mg | ORAL_TABLET | Freq: Three times a day (TID) | ORAL | Status: DC | PRN

## 2013-08-04 MED ORDER — OLANZAPINE 5 MG PO TBDP
5.0000 mg | ORAL_TABLET | Freq: Three times a day (TID) | ORAL | Status: DC | PRN
  Administered 2013-08-06 – 2013-08-07 (×2): 5 mg via ORAL
  Filled 2013-08-04 (×2): qty 1

## 2013-08-04 MED ORDER — INSULIN ASPART 100 UNIT/ML ~~LOC~~ SOLN
4.0000 [IU] | Freq: Three times a day (TID) | SUBCUTANEOUS | Status: DC
  Administered 2013-08-04 – 2013-08-06 (×7): 4 [IU] via SUBCUTANEOUS
  Administered 2013-08-07: 07:00:00 via SUBCUTANEOUS
  Administered 2013-08-07 – 2013-08-09 (×7): 4 [IU] via SUBCUTANEOUS

## 2013-08-04 MED ORDER — TRAZODONE HCL 100 MG PO TABS
100.0000 mg | ORAL_TABLET | Freq: Every evening | ORAL | Status: DC | PRN
  Administered 2013-08-05 – 2013-08-07 (×2): 100 mg via ORAL
  Filled 2013-08-04: qty 1

## 2013-08-04 MED ORDER — ALUM & MAG HYDROXIDE-SIMETH 200-200-20 MG/5ML PO SUSP: 30.0000 mL | ORAL | Status: DC | PRN

## 2013-08-04 MED ORDER — INSULIN ASPART 100 UNIT/ML ~~LOC~~ SOLN
0.0000 [IU] | Freq: Three times a day (TID) | SUBCUTANEOUS | Status: DC
  Administered 2013-08-04 – 2013-08-05 (×2): 2 [IU] via SUBCUTANEOUS
  Administered 2013-08-06: 3 [IU] via SUBCUTANEOUS
  Administered 2013-08-06 – 2013-08-07 (×2): 2 [IU] via SUBCUTANEOUS
  Administered 2013-08-07: 3 [IU] via SUBCUTANEOUS
  Administered 2013-08-07 – 2013-08-10 (×5): 2 [IU] via SUBCUTANEOUS

## 2013-08-04 MED ORDER — GABAPENTIN 100 MG PO CAPS
100.0000 mg | ORAL_CAPSULE | Freq: Two times a day (BID) | ORAL | Status: DC
  Administered 2013-08-04 – 2013-08-10 (×12): 100 mg via ORAL
  Filled 2013-08-04 (×15): qty 1

## 2013-08-04 MED ORDER — MAGNESIUM HYDROXIDE 400 MG/5ML PO SUSP
30.0000 mL | Freq: Every day | ORAL | Status: DC | PRN
  Administered 2013-08-08: 30 mL via ORAL

## 2013-08-04 MED ORDER — METFORMIN HCL 500 MG PO TABS
500.0000 mg | ORAL_TABLET | Freq: Two times a day (BID) | ORAL | Status: DC
  Administered 2013-08-05 – 2013-08-10 (×11): 500 mg via ORAL
  Filled 2013-08-04 (×15): qty 1

## 2013-08-04 MED ORDER — LISINOPRIL 20 MG PO TABS
20.0000 mg | ORAL_TABLET | Freq: Every day | ORAL | Status: DC
  Administered 2013-08-04 – 2013-08-10 (×7): 20 mg via ORAL
  Filled 2013-08-04 (×10): qty 1

## 2013-08-04 MED ORDER — IBUPROFEN 200 MG PO TABS
600.0000 mg | ORAL_TABLET | Freq: Three times a day (TID) | ORAL | Status: DC | PRN
  Administered 2013-08-04: 600 mg via ORAL
  Filled 2013-08-04: qty 3

## 2013-08-04 MED ORDER — NICOTINE 21 MG/24HR TD PT24
21.0000 mg | MEDICATED_PATCH | Freq: Every day | TRANSDERMAL | Status: DC
  Administered 2013-08-04: 21 mg via TRANSDERMAL
  Filled 2013-08-04: qty 1

## 2013-08-04 MED ORDER — NICOTINE 21 MG/24HR TD PT24
21.0000 mg | MEDICATED_PATCH | Freq: Every day | TRANSDERMAL | Status: DC
  Administered 2013-08-05 – 2013-08-09 (×5): 21 mg via TRANSDERMAL
  Filled 2013-08-04 (×7): qty 1

## 2013-08-04 NOTE — Progress Notes (Signed)
Patient ID: [name], male   DOB: [birth year], 53 y.o.   MRN: [id number] This is a 53 year old male admitted with auditory hallucinations including command voices to harm himself and cocaine detox. However, pt denies SI/HI and AVH on admission. Pt also contracts for safety. Pt c/o being addicted to cocaine since 1986 without any periods of sobriety. Pt is diabetic and was provided food on admission. Pt states that he has been in prison 10x's and is estranged from his family, but acknowledges a need to join NA for support. 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 Pt's mood is sad/depressed and his affect is sad. He is pleasant and cooperative with staff. Writer oriented pt to the milieu and 15 minute checks were initiated for safety.

## 2013-08-04 NOTE — [facility] Group Notes (Signed)
Adult Psychoeducational Group Note  Date:  08/04/2013 Time:  9:09 PM  Group Topic/Focus:  Wrap-Up Group:   The focus of this group is to help patients review their daily goal of treatment and discuss progress on daily workbooks.  Participation Level:  Active  Participation Quality:  Appropriate  Affect:  Appropriate  Cognitive:  Appropriate  Insight: Appropriate  Engagement in Group:  Engaged  Modes of Intervention:  Discussion  Additional Comments:  [name] stated that he was glad God loves him and watches over him, he stated he gets along with everyone.  He's looking forward to going to a long term treatment program and get his children back in his life.  Caroll RancherLindsay, [place] First A 08/04/2013, 9:09 PM

## 2013-08-04 NOTE — ED Notes (Addendum)
Pt states that he is homeless and has been walking all day and that he bruised his feet and he is diabetic. Pt states that he is hearing voices that are telling him to kill him self. Pt denies SI. Pt does not have any wound to feet or toes. Pulses palpable. Pt reports last use of cocaine was today unknown amount but he did use $300 worth.

## 2013-08-04 NOTE — Consult Note (Signed)
  [name] remains depressed.  "I'm rundown, depressed and hearing voices".  Says he has a cocaine habit and cannot stop.  [facility] of his depression, psychosis and chronic use of cocaine.  Bed number is 405-2.

## 2013-08-04 NOTE — Tx Team (Signed)
Initial Interdisciplinary Treatment Plan  PATIENT STRENGTHS: (choose at least two) Communication skills General fund of knowledge Physical Health  PATIENT STRESSORS: Financial difficulties Health problems Legal issue Marital or family conflict Substance abuse   PROBLEM LIST: Problem List/Patient Goals Date to be addressed Date deferred Reason deferred Estimated date of resolution  Substance Abuse 08/04/2013   08/11/2013  Mental Illness 08/04/2013   08/11/2013  Depression 08/04/2013   08/11/2013  Suicidal Ideation 08/04/2013   08/11/2013  Diabetes 08/04/2013   08/11/2013                           DISCHARGE CRITERIA:  Improved stabilization in mood, thinking, and/or behavior Medical problems require only outpatient monitoring Motivation to continue treatment in a less acute level of care Need for constant or close observation no longer present Reduction of life-threatening or endangering symptoms to within safe limits Verbal commitment to aftercare and medication compliance Withdrawal symptoms are absent or subacute and managed without 24-hour nursing intervention  PRELIMINARY DISCHARGE PLAN: Attend 12-step recovery group Return to previous living arrangement  PATIENT/FAMIILY INVOLVEMENT: This treatment plan has been presented to and reviewed with the patient, [name], and/or family member.  The patient and family have been given the opportunity to ask questions and make suggestions.  [name] [name] 08/04/2013, 4:31 PM

## 2013-08-04 NOTE — ED Notes (Signed)
Per EMS: Pt has been walking 5-10 miles. Pt has leg pain and has not eaten today. Pt schizophrenic and c/o hearing voices. Just released from jail 2 days ago and has not had any meds.

## 2013-08-04 NOTE — ED Notes (Signed)
Bed: [facility]4 Expected date:  Expected time:  Means of arrival:  Comments: EMS-medical clearance 

## 2013-08-04 NOTE — Progress Notes (Signed)
[facility], by Dr. Ladona Ridgelaylor to Dr. Jannifer FranklinAkintayo. Patient to be transported voluntarily by [facility]. RN to complete support paperwork with patient.   Byrd HesselbachKristen Talal Fritchman, LCSW [phone]  ED CSW 08/04/2013 1346pm

## 2013-08-04 NOTE — ED Provider Notes (Signed)
CSN: [id number]     Arrival date & time 08/04/13  0141 History   First MD Initiated Contact with Patient 08/04/13 [phone]     Chief Complaint  Patient presents with  . Medical Clearance  . Foot Pain   (Consider location/radiation/quality/duration/timing/severity/associated sxs/prior Treatment) HPI Comments: Patient long-standing history of hallucinations.  States he is hearing voices to kill himself.  He states he was released from prison/jail yesterday morning after 30 days of incarceration.  He immediately went out and did cocaine, but knows that if he doesn't get drug treatment.  Soon.  He will go to jail for the rest of his life as he is a habitual fell, and he, states he has a room in the room.  He, his feet are chronically painful from his neuropathy.  He, states he normally gets Prolixin shots, but, since she's been in jail, he missed his last one  Patient is a 53 y.o. male presenting with lower extremity pain. The history is provided by the patient.  Foot Pain This is a chronic problem. The current episode started yesterday. The problem occurs constantly. The problem has been unchanged. Associated symptoms include arthralgias. Pertinent negatives include no headaches or weakness. Exacerbated by: Cocaine use. He has tried nothing for the symptoms. The treatment provided no relief.    Past Medical History  Diagnosis Date  . Diabetes mellitus without complication   . Hypertension   . Schizophrenia    History reviewed. No pertinent past surgical history. History reviewed. No pertinent family history. History  Substance Use Topics  . Smoking status: Current Every Day Smoker -- 2.00 packs/day    Types: Cigarettes  . Smokeless tobacco: Current User  . Alcohol Use: Yes    Review of Systems  Musculoskeletal: Positive for arthralgias.  Neurological: Negative for dizziness, weakness and headaches.  Psychiatric/Behavioral: Positive for hallucinations. Negative for suicidal ideas.  All  other systems reviewed and are negative.    Allergies  Haldol  Home Medications   Current Outpatient Rx  Name  Route  Sig  Dispense  Refill  . cloNIDine (CATAPRES) 0.2 MG tablet   Oral   Take 1 tablet (0.2 mg total) by mouth 2 (two) times daily. For high blood pressure.   60 tablet   0   . fluPHENAZine (PROLIXIN) 5 MG tablet   Oral   Take 1 tablet (5 mg total) by mouth daily. For psychosis.   30 tablet   0   . gabapentin (NEURONTIN) 300 MG capsule   Oral   Take 1 capsule (300 mg total) by mouth 2 (two) times daily. For anxiety and neuropathic pain.  You may take 2 capsules at night if one capsule is not adequate to control pain.   60 capsule   0    BP 142/73  Pulse 111  Temp(Src) 98.5 F (36.9 C) (Oral)  Resp 20  Ht 5\' 9"  (1.753 m)  Wt 189 lb (85.73 kg)  BMI 27.90 kg/m2  SpO2 98% Physical Exam  Nursing note and vitals reviewed. Constitutional: He is oriented to person, place, and time. He appears well-developed and well-nourished.  HENT:  Head: Normocephalic.  Eyes: Pupils are equal, round, and reactive to light.  Neck: Normal range of motion.  Cardiovascular: Normal rate and regular rhythm.   Pulmonary/Chest: Effort normal and breath sounds normal.  Musculoskeletal: Normal range of motion. He exhibits no edema and no tenderness.  Neurological: He is oriented to person, place, and time.  Skin: Skin is warm. No  rash noted.  Psychiatric: His speech is normal and behavior is normal. His mood appears anxious. Thought content is delusional. Cognition and memory are impaired. He expresses inappropriate judgment. He expresses no homicidal and no suicidal ideation. He expresses no suicidal plans and no homicidal plans.    ED Course  Procedures (including critical care time) Labs Review Labs Reviewed  URINE RAPID DRUG SCREEN (HOSP PERFORMED) - Abnormal; Notable for the following:    Cocaine POSITIVE (*)    All other components within normal limits  CBC   COMPREHENSIVE METABOLIC PANEL  ETHANOL   Imaging Review No results found.  EKG Interpretation   None       MDM  No diagnosis found.  Will have patient moved to our psychiatric evaluation unit for assessment    Arman FilterGail K Appolonia Ackert, NP 08/04/13 0225

## 2013-08-04 NOTE — BH Assessment (Signed)
Assessment Note  [name] is a 53 y.o. male who voluntarily presents with c/o Aud Halluc with command to harm self.  Pt denies SI/HI.  Pt reports the following: he was released from jail after spending 30 days for trespassing.  Pt also tells this Clinical research associate that he has upcoming court dates on 08/13/13 and 09/23/13 for possession of cocaine.  Pt denies a plan at this time.  Pt is homeless.  He told medical staff that he immediately used drugs upon release from jail and stated that if he doesn't receive drug treatment, he will return to jail for life because he is a habitual felon.  Pt says he received psych meds while incarcerated but doesn't feel they are working properly.  Pt admits he used $300 worth crack cocaine and uses his SSI check to fund his habit.  Pt is also c/o foot pain associated with diabetic neuropathy, pt treated per hospital  protocol.    Axis I: Schizophrenia; Cocaine Abuse Axis II: Deferred Axis III:  Past Medical History  Diagnosis Date  . Diabetes mellitus without complication   . Hypertension   . Schizophrenia    Axis IV: housing problems, occupational problems, other psychosocial or environmental problems, problems related to legal system/crime, problems related to social environment and problems with primary support group Axis V: 31-40 impairment in reality testing  Past Medical History:  Past Medical History  Diagnosis Date  . Diabetes mellitus without complication   . Hypertension   . Schizophrenia     History reviewed. No pertinent past surgical history.  Family History: History reviewed. No pertinent family history.  Social History:  reports that he has been smoking Cigarettes.  He has been smoking about 2.00 packs per day. He uses smokeless tobacco. He reports that he drinks alcohol. He reports that he uses illicit drugs ("Crack" cocaine and Cocaine).  Additional Social History:  Alcohol / Drug Use Pain Medications: See MAR  Prescriptions: See MAR   Over the Counter: See MAR  History of alcohol / drug use?: Yes Longest period of sobriety (when/how long): None  Negative Consequences of Use: Work / Web designer relationships;Legal;Financial Withdrawal Symptoms: Other (Comment) (No w/d sxs reported ) Substance #1 Name of Substance 1: Crack Cocaine  1 - Age of First Use: 20's  1 - Amount (size/oz): Varies  1 - Frequency: Daily  1 - Duration: On-going  1 - Last Use / Amount: 08/03/13  CIWA: CIWA-Ar BP: 134/76 mmHg Pulse Rate: 100 COWS:    Allergies:  Allergies  Allergen Reactions  . Haldol [Haloperidol] Other (See Comments)    Muscle spasms, loss of voluntary movement    Home Medications:  (Not in a hospital admission)  OB/GYN Status:  No LMP for male patient.  General Assessment Data Location of Assessment: [facility] Is this a Tele or Face-to-Face Assessment?: Face-to-Face Is this an Initial Assessment or a Re-assessment for this encounter?: Initial Assessment Living Arrangements: Other (Comment) (Homeless ) Can pt return to current living arrangement?: Yes Admission Status: Voluntary Is patient capable of signing voluntary admission?: Yes Transfer from: Acute Hospital Referral Source: MD  Medical Screening Exam [facility] Walk-in ONLY) Medical Exam completed: No Reason for MSE not completed: Other: (None )  [facility] Crisis Care Plan Living Arrangements: Other (Comment) (Homeless ) Name of Psychiatrist: None  Name of Therapist: None   Education Status Is patient currently in school?: No Current Grade: None  Highest grade of school patient has completed: None  Name of school: None  Contact person: None   Risk to self Suicidal Ideation: No Suicidal Intent: No Is patient at risk for suicide?: No Suicidal Plan?: No Access to Means: No What has been your use of drugs/alcohol within the last 12 months?: Abusing: crack cocaine  Previous Attempts/Gestures: No How many times?: 0 Other Self Harm Risks: None  Triggers  for Past Attempts: None known Intentional Self Injurious Behavior: None Family Suicide History: No Recent stressful life event(s): Legal Issues;Other (Comment) (Homeless ) Persecutory voices/beliefs?: No Depression: Yes Depression Symptoms: Feeling angry/irritable;Loss of interest in usual pleasures Substance abuse history and/or treatment for substance abuse?: Yes Suicide prevention information given to non-admitted patients: Not applicable  Risk to Others Homicidal Ideation: No Thoughts of Harm to Others: No Current Homicidal Intent: No Current Homicidal Plan: No Access to Homicidal Means: No Identified Victim: None  History of harm to others?: No Assessment of Violence: None Noted Violent Behavior Description: None  Does patient have access to weapons?: No Criminal Charges Pending?: Yes Describe Pending Criminal Charges: Possession of Cocaine; Other unk charges  Does patient have a court date: Yes Court Date: 08/13/13 (09/23/13)  Psychosis Hallucinations: Auditory;With command Delusions: None noted  Mental Status Report Appear/Hygiene: Disheveled Eye Contact: Fair Motor Activity: Unremarkable Speech: Logical/coherent Level of Consciousness: Alert;Irritable Mood: Irritable Affect: Irritable;Appropriate to circumstance Anxiety Level: None Thought Processes: Coherent;Relevant Judgement: Unimpaired Orientation: Person;Place;Time;Situation Obsessive Compulsive Thoughts/Behaviors: None  Cognitive Functioning Concentration: Normal Memory: Recent Intact;Remote Intact IQ: Average Insight: Fair Impulse Control: Fair Appetite: Good Weight Loss: 0 Weight Gain: 0 Sleep: No Change Total Hours of Sleep: 6 Vegetative Symptoms: None  ADLScreening [facility] Assessment Services) Patient's cognitive ability adequate to safely complete daily activities?: Yes Patient able to express need for assistance with ADLs?: Yes Independently performs ADLs?: Yes (appropriate for  developmental age)  Prior Inpatient Therapy Prior Inpatient Therapy: Yes Prior Therapy Dates: 2001,2005,2009,2010,2014 Prior Therapy Facilty/Provider(s): [facility], [facility]  Reason for Treatment: SI/SA/AVH   Prior Outpatient Therapy Prior Outpatient Therapy: No Prior Therapy Dates: None  Prior Therapy Facilty/Provider(s): None  Reason for Treatment: None   ADL Screening (condition at time of admission) Patient's cognitive ability adequate to safely complete daily activities?: Yes Is the patient deaf or have difficulty hearing?: No Does the patient have difficulty seeing, even when wearing glasses/contacts?: No Does the patient have difficulty concentrating, remembering, or making decisions?: No Patient able to express need for assistance with ADLs?: Yes Does the patient have difficulty dressing or bathing?: No Independently performs ADLs?: Yes (appropriate for developmental age) Does the patient have difficulty walking or climbing stairs?: No Weakness of Legs: None Weakness of Arms/Hands: None  Home Assistive Devices/Equipment Home Assistive Devices/Equipment: None  Therapy Consults (therapy consults require a physician order) PT Evaluation Needed: No OT Evalulation Needed: No SLP Evaluation Needed: No Abuse/Neglect Assessment (Assessment to be complete while patient is alone) Physical Abuse: Denies Verbal Abuse: Denies Sexual Abuse: Denies Exploitation of patient/patient's resources: Denies Self-Neglect: Denies Values / Beliefs Cultural Requests During Hospitalization: None Spiritual Requests During Hospitalization: None Consults Spiritual Care Consult Needed: No Social Work Consult Needed: No Merchant navy officer (For Healthcare) Advance Directive: Patient does not have advance directive;Patient would not like information Pre-existing out of facility DNR order (yellow form or pink MOST form): No Nutrition Screen- [facility] Patient's home diet:  Regular  Additional Information 1:1 In Past 12 Months?: No CIRT Risk: No Elopement Risk: No Does patient have medical clearance?: Yes     Disposition:  Disposition Initial Assessment Completed for this Encounter:  Yes Disposition of Patient: Referred to (Declined by Donell SievertSpencer Simon, PA, TTS to seek other placement ) Patient referred to: Other (Comment) (Declined by Donell SievertSpencer Simon, PA, TTS to seek other placement )  On Site Evaluation by:   Reviewed with Physician:    Murrell ReddenSimmons, Nainika Newlun C 08/04/2013 6:02 AM

## 2013-08-04 NOTE — ED Notes (Addendum)
Pt has 2 bags of clothing, and 1 ID card.  Pt has been seen and wanded by security.

## 2013-08-05 DIAGNOSIS — F209 Schizophrenia, unspecified: Secondary | ICD-10-CM

## 2013-08-05 LAB — GLUCOSE, CAPILLARY
GLUCOSE-CAPILLARY: 111 mg/dL — AB (ref 70–99)
GLUCOSE-CAPILLARY: 133 mg/dL — AB (ref 70–99)
Glucose-Capillary: 118 mg/dL — ABNORMAL HIGH (ref 70–99)
Glucose-Capillary: 125 mg/dL — ABNORMAL HIGH (ref 70–99)

## 2013-08-05 MED ORDER — DIPHENHYDRAMINE HCL 25 MG PO CAPS
50.0000 mg | ORAL_CAPSULE | Freq: Four times a day (QID) | ORAL | Status: DC | PRN
  Administered 2013-08-05 (×2): 50 mg via ORAL
  Filled 2013-08-05 (×2): qty 2

## 2013-08-05 NOTE — Progress Notes (Addendum)
D:  Patient's self inventory sheet, patient stated he sleeps well, good appetite, normal energy level, improving attention span.  Rated depression, anxiety, hopeless #5.  Denied withdrawals.  Denied SI.  Zero pain goal, zero pain.  No discharge plans.  No problems purchasing medications. A:   Medications administered per MD orders.  Emotional support and encouragement given patient. R:  Denied SI and HI.  Denied A/V hallucinations.  Denied pain.  Will continue to monitor patient for safety with 15 minute checks.  Safety maintained. Patient presently resting in bed with eyes closed.  Patient has been pleasant today. Nurse asked patient to go with her to get his weight and height.  Patient stated he would get out of bed later.  Pt stated his height is 5 ft 9 in, weight 189 lbs.

## 2013-08-05 NOTE — [facility] Group Notes (Signed)
[facility] Group Notes:  (Counselor/Nursing/MHT/Case Management/Adjunct)  08/05/2013 1:15PM  Type of Therapy:  Group Therapy  Participation Level:  Invited, declined to come    Summary of Progress/Problems: The topic for group was balance in life.  Pt participated in the discussion about when their life was in balance and out of balance and how this feels.  Pt discussed ways to get back in balance and short term goals they can work on to get where they want to be.    [name], [name] 08/05/2013 12:51 PM

## 2013-08-05 NOTE — Progress Notes (Signed)
The focus of this group is to help patients review their daily goal of treatment and discuss progress on daily workbooks. Pt shared that he had a good day, although it started rocky. Pt shared that he was finding it difficult to tolerate another pt on the unit. Pt shared that he coped with this situation by walking away, praying, and reading the Bible. Pt shared that he wants to change his life in order to stay out of jail/prison. Pt verbalized a desire to stay "off the street" and to receive in-patient substance abuse rehabilitation treatment.

## 2013-08-05 NOTE — H&P (Signed)
Psychiatric Admission Assessment Adult  Patient Identification:  [name] Date of Evaluation:  08/05/2013 Chief Complaint:  "I'm depressed and hearing voices."  History of Present Illness: [name] is a 53 year old male who presented voluntarily to [facility] complaining of command auditory hallucinations with command to harm himself. The patient reported being recently released from jail after serving thirty days for trespassing. He has upcoming court dates for possession of cocaine. [name] started to use cocaine immediately after leaving jail and feels that he needs treatment for drug abuse or he will end up in jail for life. Patient usually gets Prolixin injections but has gotten off scheduled due to being in jail. The patient has also been noncompliant with his medication for diabetes. Patient is interested in going to rehab to get help with his substance abuse problems.   Elements:  Location:  Depression, Psychosis  Quality:  chronic. Severity:  moderate. Timing:  1 month. Duration:  since last discharge from [facility] in November 2014  Context:  homeless, non compliant, continuing to use cocaine and alcohol. Associated Signs/Synptoms: Depression Symptoms:  depressed mood, insomnia, fatigue, feelings of worthlessness/guilt, hopelessness, recurrent thoughts of death, anxiety, (Hypo) Manic Symptoms:  Impulsivity, Irritable Mood, Anxiety Symptoms:  Excessive Worry, Panic Symptoms, Psychotic Symptoms:  Hallucinations: Auditory PTSD Symptoms: NA  Psychiatric Specialty Exam: Physical Exam  Constitutional: He appears well-developed and well-nourished.  Physical exam findings reviewed from [facility] on 08/04/13 and I concur with findings with no noted exceptions.     Review of Systems  Constitutional: Positive for malaise/fatigue.  HENT: Positive for congestion.   Eyes: Negative.   Respiratory: Negative.   Cardiovascular: Negative.   Gastrointestinal: Negative.    Genitourinary: Negative.   Musculoskeletal: Negative.   Skin: Negative.   Neurological: Negative.   Endo/Heme/Allergies: Negative.   Psychiatric/Behavioral: Positive for depression, hallucinations and substance abuse. Negative for suicidal ideas and memory loss. The patient is nervous/anxious and has insomnia.     Blood pressure 164/94, pulse 72, temperature 98.1 F (36.7 C), temperature source Oral, resp. rate 18, weight 83.008 kg (183 lb).Body mass index is 27.01 kg/(m^2).  General Appearance: Disheveled  Eye Contact::  Minimal  Speech:  Clear and Coherent  Volume:  Normal  Mood:  Depressed  Affect:  Blunt  Thought Process:  Disorganized  Orientation:  Full (Time, Place, and Person)  Thought Content:  Delusions and Hallucinations: Auditory  Suicidal Thoughts:  No  Homicidal Thoughts:  No  Memory:  Immediate;   Fair Recent;   Fair Remote;   Poor  Judgement:  Impaired  Insight:  Lacking  Psychomotor Activity:  Decreased  Concentration:  Poor  Recall:  Fair  Akathisia:  No  Handed:  Right  AIMS (if indicated):     Assets:  Communication Skills  Sleep:  Number of Hours: 4.75  Fund of knowledge: Fair  Language: Fair   Musculoskeletal:  Strength & Muscle Tone: within normal limits  Gait & Station: normal  Patient leans: N/A  Past Psychiatric History:Yes  Diagnosis:Schizophrenia   Hospitalizations: Multiple at [facility]  Outpatient Care:Did not follow up as directed at [facility]   Substance Abuse Care:Patient reports once in the past at a facility that is now closed.   Self-Mutilation:Denies   Suicidal Attempts:No   Violent Behaviors:No   Past Medical History:   Past Medical History  Diagnosis Date  . Diabetes mellitus without complication   . Hypertension   . Schizophrenia    None. Allergies:   Allergies  Allergen  Reactions  . Haldol [Haloperidol] Other (See Comments)    Muscle spasms, loss of voluntary movement   PTA Medications: Prescriptions prior to  admission  Medication Sig Dispense Refill  . acetaminophen (TYLENOL) 500 MG tablet Take 1,000 mg by mouth every 6 (six) hours as needed for moderate pain.      . cloNIDine (CATAPRES) 0.2 MG tablet Take 1 tablet (0.2 mg total) by mouth 2 (two) times daily. For high blood pressure.  60 tablet  0  . fluPHENAZine (PROLIXIN) 5 MG tablet Take 1 tablet (5 mg total) by mouth daily. For psychosis.  30 tablet  0  . gabapentin (NEURONTIN) 300 MG capsule Take 1 capsule (300 mg total) by mouth 2 (two) times daily. For anxiety and neuropathic pain.  You may take 2 capsules at night if one capsule is not adequate to control pain.  60 capsule  0    Previous Psychotropic Medications:  Medication/Dose  Prolixin 5 mg at hs                Substance Abuse History in the last 12 months:  yes  Consequences of Substance Abuse: Medical Consequences:  worsening mental health  Social History:  reports that he has been smoking Cigarettes.  He has been smoking about 2.00 packs per day. He uses smokeless tobacco. He reports that he uses illicit drugs ("Crack" cocaine and Cocaine) about 7 times per week. He reports that he does not drink alcohol. Additional Social History: History of alcohol / drug use?: Yes                    Current Place of Residence:   Place of Birth:   Family Members: Marital Status:  Single Children:  Sons:  Daughters: Relationships: Education:  [employer] Problems/Performance: Religious Beliefs/Practices: History of Abuse (Emotional/Phsycial/Sexual) Teacher, music History:  None. Legal History: Hobbies/Interests:  Family History:  History reviewed. No pertinent family history. Patient denies knowledge of mental health history in his family.   Results for orders placed during the hospital encounter of 08/04/13 (from the past 72 hour(s))  GLUCOSE, CAPILLARY     Status: Abnormal   Collection Time    08/04/13  4:09 PM      Result  Value Range   Glucose-Capillary 156 (*) 70 - 99 mg/dL  GLUCOSE, CAPILLARY     Status: Abnormal   Collection Time    08/04/13  5:16 PM      Result Value Range   Glucose-Capillary 143 (*) 70 - 99 mg/dL  GLUCOSE, CAPILLARY     Status: Abnormal   Collection Time    08/04/13  8:46 PM      Result Value Range   Glucose-Capillary 139 (*) 70 - 99 mg/dL  GLUCOSE, CAPILLARY     Status: Abnormal   Collection Time    08/05/13  6:09 AM      Result Value Range   Glucose-Capillary 118 (*) 70 - 99 mg/dL  GLUCOSE, CAPILLARY     Status: Abnormal   Collection Time    08/05/13 11:33 AM      Result Value Range   Glucose-Capillary 125 (*) 70 - 99 mg/dL   Comment 1 Notify RN     Psychological Evaluations:  Assessment:   DSM5:  Schizophrenia Disorders:  Schizophrenia, paranoid type Obsessive-Compulsive Disorders:   Trauma-Stressor Disorders:   Substance/Addictive Disorders:  Cocaine abuse  Depressive Disorders:    AXIS I:  Paranoid schizophrenia, Cocaine dependence AXIS II:  Cluster B Traits AXIS III:   Past Medical History  Diagnosis Date  . Diabetes mellitus without complication   . Hypertension   . Schizophrenia    AXIS IV:  housing problems and problems related to social environment AXIS V:  41-50 serious symptoms  Treatment Plan/Recommendations:  1. Admit for crisis management and stabilization. 2. Medication management to reduce current symptoms to base line and improve the patient's overall level of functioning. 3. Treat health problems as indicated. Restart medications to address chronic health problems to include Lisinopril 20 mg daily for hypertension, Metformin 500 mg BID for diabetes along with Novolog SSI and 4 units of meal coverage. Neurontin 100 mg BID for neuropathic pain/improved mood stability.  4. Develop treatment plan to decrease risk of relapse upon discharge and to reduce the need for readmission. 5. Psycho-social education regarding relapse prevention and self  care. 6. Health care follow up as needed for medical problems. 7. Restart home medications where appropriate.  Treatment Plan Summary: Daily contact with patient to assess and evaluate symptoms and progress in treatment Medication management Current Medications:  Current Facility-Administered Medications  Medication Dose Route Frequency Provider Last Rate Last Dose  . acetaminophen (TYLENOL) tablet 650 mg  650 mg Oral Q6H PRN Benjaman PottGerald D Taylor, MD   650 mg at 08/05/13 0123  . alum & mag hydroxide-simeth (MAALOX/MYLANTA) 200-200-20 MG/5ML suspension 30 mL  30 mL Oral Q4H PRN Benjaman PottGerald D Taylor, MD      . diphenhydrAMINE (BENADRYL) capsule 50 mg  50 mg Oral Q6H PRN Kerry HoughSpencer E Simon, PA-C   50 mg at 08/05/13 0844  . gabapentin (NEURONTIN) capsule 100 mg  100 mg Oral BID Fransisca KaufmannLaura Davis, NP   100 mg at 08/05/13 0841  . insulin aspart (novoLOG) injection 0-15 Units  0-15 Units Subcutaneous TID WC Fransisca KaufmannLaura Davis, NP   2 Units at 08/05/13 1147  . insulin aspart (novoLOG) injection 4 Units  4 Units Subcutaneous TID WC Fransisca KaufmannLaura Davis, NP   4 Units at 08/05/13 1148  . lisinopril (PRINIVIL,ZESTRIL) tablet 20 mg  20 mg Oral Daily Fransisca KaufmannLaura Davis, NP   20 mg at 08/05/13 0841  . magnesium hydroxide (MILK OF MAGNESIA) suspension 30 mL  30 mL Oral Daily PRN Benjaman PottGerald D Taylor, MD      . metFORMIN (GLUCOPHAGE) tablet 500 mg  500 mg Oral BID WC Fransisca KaufmannLaura Davis, NP   500 mg at 08/05/13 0841  . nicotine (NICODERM CQ - dosed in mg/24 hours) patch 21 mg  21 mg Transdermal Daily Benjaman PottGerald D Taylor, MD   21 mg at 08/05/13 [id number]  . OLANZapine zydis (ZYPREXA) disintegrating tablet 5 mg  5 mg Oral Q8H PRN Fransisca KaufmannLaura Davis, NP      . traZODone (DESYREL) tablet 100 mg  100 mg Oral QHS PRN Fransisca KaufmannLaura Davis, NP        Observation Level/Precautions:  15 minute checks  Laboratory:  Chem profile, CBC, UDS positive for cocaine   Psychotherapy:  Individual and Group Therapy   Medications:  Zyprexa Zydis 5 mg every eight hours prn psychosis   Consultations:  If  needed  Discharge Concerns:  Increased risk for relapse  Estimated LOS:  2-3 days  Other:  Verify administration of last Prolixin injection    I certify that inpatient services furnished can reasonably be expected to improve the patient's condition.   Fransisca KaufmannDAVIS, LAURA NP-C 2/5/20151:27 PM  Patient seen, evaluated and I agree with notes by Nurse Practitioner. Thedore MinsMojeed Aleisha Paone, MD

## 2013-08-05 NOTE — Progress Notes (Signed)
The focus of this group is to educate the patient on the purpose and policies of crisis stabilization and provide a format to answer questions about their admission.  The group details unit policies and expectations of patients while admitted.  Patient did not attend 0900 nurse education orientation group this morning.  Patient stayed in bed.   

## 2013-08-05 NOTE — Tx Team (Signed)
  Interdisciplinary Treatment Plan Update   Date Reviewed:  08/05/2013  Time Reviewed:  3:14 PM  Progress in Treatment:   Attending groups: No Participating in groups: No Taking medication as prescribed: Yes  Tolerating medication: Yes Family/Significant other contact made: Yes  Patient understands diagnosis: Yes AEB asking for help with psychosis, getting into rehab Discussing patient identified problems/goals with staff: Yes  See initial care plan Medical problems stabilized or resolved: Yes Denies suicidal/homicidal ideation: Yes  In tx team Patient has not harmed self or others: Yes  For review of initial/current patient goals, please see plan of care.  Estimated Length of Stay:  4-5 days  Reason for Continuation of Hospitalization: Depression Hallucinations Medication stabilization  New Problems/Goals identified:  N/A  Discharge Plan or Barriers:   hopes to get into rehab from here  Additional Comments:  Taylor RobinsonsFrederick Holthaus is a 53 year old male who [facility] of command auditory hallucinations with command to harm himself. The patient reported being recently released from jail after serving thirty days for trespassing. He has upcoming court dates for possession of cocaine. Gelene MinkFrederick started to use cocaine immediately after leaving jail and feels that he needs treatment for drug abuse or he will end up in jail for life. Patient usually gets Prolixin injections but has gotten off scheduled due to being in jail. The patient has also been noncompliant with his medication for diabetes. Patient is interested in going to rehab to get help with his substance abuse problems.    Attendees:  Signature: Thedore MinsMojeed Akintayo, MD 08/05/2013 3:14 PM   Signature: Richelle Itood Mont Jagoda, LCSW 08/05/2013 3:14 PM  Signature: Fransisca KaufmannLaura Davis, NP 08/05/2013 3:14 PM  Signature: Joslyn Devonaroline Beaudry, RN 08/05/2013 3:14 PM  Signature: Liborio NixonPatrice White, RN 08/05/2013 3:14 PM  Signature:  08/05/2013 3:14 PM   Signature:   08/05/2013 3:14 PM  Signature:    Signature:    Signature:    Signature:    Signature:    Signature:      Scribe for Treatment Team:   Richelle Itood Tu Bayle, LCSW  08/05/2013 3:14 PM

## 2013-08-05 NOTE — Care Management Utilization Note (Signed)
Per State Regulation 482.30  The chart was reviewed for necessity with respect to the patient's Admission/ Duration of stay.Admission 08/04/13 for Schizophrenia, AH to kill himself and Cocaine Depend Severe.  Next Review Date: 08/07/13  Lacinda AxonAlice Deema Juncaj, RN, BSN

## 2013-08-05 NOTE — B[facility] Counselor (Signed)
Adult Psychosocial Assessment Update Interdisciplinary Team  [facility]:  Admissions Discharges  Date: 11/14 Date:  Date:  10/14 Date:  Date:  9/14 Date:  Date: Date:  Date: Date:   Changes since the last Psychosocial Assessment (including adherence to outpatient mental health and/or substance abuse treatment, situational issues contributing to decompensation and/or relapse). [name] did not go to [facility] after d/cing from here in November.  Nor did he follow up with his outpt provider.  He was jailed for 30 days for trespassing and paraphernalia charges recently, and has another court date for cocaine possession.  He is asking for help with voices and getting into rehab from here.             Discharge Plan 1. Will you be returning to the same living situation after discharge?   Yes: No:      If no, what is your plan?  Go to rehab           2. Would you like a referral for services when you are discharged? Yes: X    If yes, for what services?  No:       [facility] for rehab.  Also will talk to him about [facility] again even though he rejected that idea last time.       Summary and Recommendations (to be completed by the evaluator) [name] is a 53 YO [name] who has a severe and persistent mental illness accompanied by a substance abuse problem.  He can benefit from crises stabilization, medication management, therapeutic milieu and referral for services.                       Signature:  Ida Rogueorth, Duval Macleod B, 08/05/2013 5:30 PM

## 2013-08-05 NOTE — [facility] Suicide Risk Assessment (Signed)
   Nursing information obtained from:  Patient Demographic factors:  Male;Low socioeconomic status;Unemployed Current Mental Status:  NA Loss Factors:  Financial problems / change in socioeconomic status;Legal issues;Decline in physical health Historical Factors:  Family history of mental illness or substance abuse Risk Reduction Factors:  Living with another person, especially a relative Total Time spent with patient: 20 minutes  CLINICAL FACTORS:   Alcohol/Substance Abuse/Dependencies Schizophrenia:   Command hallucinatons Paranoid or undifferentiated type Currently Psychotic  Psychiatric Specialty Exam: Physical Exam  Psychiatric: His mood appears anxious. His speech is tangential. He is actively hallucinating. Thought content is paranoid. Cognition and memory are normal. He expresses impulsivity.    Review of Systems  Constitutional: Negative.   HENT: Negative.   Eyes: Negative.   Respiratory: Negative.   Cardiovascular: Negative.   Gastrointestinal: Negative.   Genitourinary: Negative.   Musculoskeletal: Negative.   Skin: Negative.   Neurological: Negative.   Endo/Heme/Allergies: Negative.   Psychiatric/Behavioral: Positive for hallucinations and substance abuse. The patient is nervous/anxious and has insomnia.     Blood pressure 164/94, pulse 72, temperature 98.1 F (36.7 C), temperature source Oral, resp. rate 18, weight 83.008 kg (183 lb).Body mass index is 27.01 kg/(m^2).  General Appearance: Disheveled  Eye Contact::  Minimal  Speech:  Pressured  Volume:  Normal  Mood:  Dysphoric  Affect:  Blunt  Thought Process:  Disorganized  Orientation:  Full (Time, Place, and Person)  Thought Content:  Delusions and Hallucinations: Auditory  Suicidal Thoughts:  No  Homicidal Thoughts:  No  Memory:  Immediate;   Fair Recent;   Fair Remote;   Poor  Judgement:  Poor  Insight:  Lacking  Psychomotor Activity:  Decreased  Concentration:  Fair  Recall:  [employer]  Fund of  Knowledge:Fair  Language: Fair  Akathisia:  No  Handed:  Right  AIMS (if indicated):     Assets:  Communication Skills Desire for Improvement  Sleep:  Number of Hours: 4.75   Musculoskeletal: Strength & Muscle Tone: within normal limits Gait & Station: normal Patient leans: N/A  COGNITIVE FEATURES THAT CONTRIBUTE TO RISK:  Closed-mindedness Polarized thinking    SUICIDE RISK:   Minimal: No identifiable suicidal ideation.  Patients presenting with no risk factors but with morbid ruminations; may be classified as minimal risk based on the severity of the depressive symptoms  PLAN OF CARE:1. Admit for crisis management and stabilization. 2. Medication management to reduce current symptoms to base line and improve the     patient's overall level of functioning 3. Treat health problems as indicated. 4. Develop treatment plan to decrease risk of relapse upon discharge and the need for     readmission. 5. Psycho-social education regarding relapse prevention and self care. 6. Health care follow up as needed for medical problems. 7. Restart home medications where appropriate.   I certify that inpatient services furnished can reasonably be expected to improve the patient's condition.  Thedore MinsAkintayo, Terel Bann, MD 08/05/2013, 10:25 AM

## 2013-08-06 DIAGNOSIS — E119 Type 2 diabetes mellitus without complications: Secondary | ICD-10-CM

## 2013-08-06 LAB — GLUCOSE, CAPILLARY
Glucose-Capillary: 110 mg/dL — ABNORMAL HIGH (ref 70–99)
Glucose-Capillary: 125 mg/dL — ABNORMAL HIGH (ref 70–99)
Glucose-Capillary: 157 mg/dL — ABNORMAL HIGH (ref 70–99)
Glucose-Capillary: 133 mg/dL — ABNORMAL HIGH (ref 70–99)

## 2013-08-06 MED ORDER — ARIPIPRAZOLE 5 MG PO TABS
5.0000 mg | ORAL_TABLET | Freq: Two times a day (BID) | ORAL | Status: DC
  Administered 2013-08-06 – 2013-08-07 (×3): 5 mg via ORAL
  Filled 2013-08-06 (×6): qty 1

## 2013-08-06 MED ORDER — TRIHEXYPHENIDYL HCL 5 MG PO TABS
5.0000 mg | ORAL_TABLET | Freq: Two times a day (BID) | ORAL | Status: DC
  Administered 2013-08-06 – 2013-08-10 (×8): 5 mg via ORAL
  Filled 2013-08-06 (×11): qty 1

## 2013-08-06 NOTE — Progress Notes (Signed)
Adult Psychoeducational Group Note  Date:  08/06/2013 Time:  10:00AM  Group Topic/Focus:  Relapse Prevention Planning:   The focus of this group is to define relapse and discuss the need for planning to combat relapse.  Participation Level:  Did Not Attend  Additional Comments:  Pt did not attend the session.   Zacarias PontesSmith, Semisi Biela R 08/06/2013, 11:04 AM

## 2013-08-06 NOTE — B[facility] Group Notes (Signed)
.  B[facility] LCSW Group Therapy  08/06/2013  1:05 PM  Type of Therapy:  Group therapy  Participation Level:  Did not attend    Summary of Progress/Problems:  Chaplain was here to lead a group on themes of hope and courage.  Daryel Geraldorth, Mckell Riecke B 08/06/2013 1:33 PM

## 2013-08-06 NOTE — Progress Notes (Signed)
D   Pt is pleasant on approach but admits to depression with some anxiety  He interacts minimally and isolates to his room   He is cooperative with his treatment A   Verbal support given   Medications administered and effectiveness monitored   Q 15 min checks R   Pt safe at present

## 2013-08-06 NOTE — [facility] Group Notes (Signed)
[facility]   08/06/2013 8:12 AM  Participation Quality:  Did not attend      [place] Islandsorth, Elizjah Noblet B

## 2013-08-06 NOTE — Progress Notes (Signed)
[facility]  08/06/2013 11:25 AM [name]  MRN:  [id number] Subjective:   Patient states "I am still hearing voices that tell me to harm myself. They are not as bad as they were before coming to the hospital. I think starting on a new medication sounds like a good idea. The Prolixin I took before wasn't really helping me."   Objective:  Patient reports ongoing symptoms of psychosis but reports some improvement. He appears very depressed and is very guarded during interactions. [name] has been isolating to his room having little interactions with staff and peers. Patient has requested help from social worker to get into rehab. He reports feeling depressed about having upcoming court dates for possession of cocaine. [name] is hopeful that being started on a new medication will help his psychiatric symptoms improve.   Diagnosis:   DSM5: Schizophrenia Disorders: Schizophrenia, paranoid type  Obsessive-Compulsive Disorders:  Trauma-Stressor Disorders:  Substance/Addictive Disorders: Cocaine abuse  Depressive Disorders:  AXIS I: Paranoid schizophrenia, Cocaine dependence  AXIS II: Cluster B Traits  AXIS III:  Past Medical History   Diagnosis  Date   .  Diabetes mellitus without complication    .  Hypertension    .  Schizophrenia     AXIS IV: housing problems and problems related to social environment  AXIS V: 41-50 serious symptoms  ADL's:  Intact  Sleep: Fair  Appetite:  Fair  Suicidal Ideation:  Denies  Homicidal Ideation:  Denies  AEB (as evidenced by):  Psychiatric Specialty Exam: Physical Exam  Review of Systems  Constitutional: Negative.   HENT: Negative.   Eyes: Negative.   Respiratory: Negative.   Cardiovascular: Negative.   Gastrointestinal: Negative.   Genitourinary: Negative.   Musculoskeletal: Negative.   Skin: Negative.   Neurological: Negative.   Endo/Heme/Allergies: Negative.   Psychiatric/Behavioral: Positive for depression,  hallucinations and substance abuse. Negative for suicidal ideas and memory loss. The patient is nervous/anxious. The patient does not have insomnia.     Blood pressure 161/88, pulse 87, temperature 98 F (36.7 C), temperature source Oral, resp. rate 20, weight 83.008 kg (183 lb).Body mass index is 27.01 kg/(m^2).  General Appearance: Disheveled  Eye SolicitorContact::  Fair  Speech:  Clear and Coherent  Volume:  Decreased  Mood:  Dysphoric  Affect:  Blunt  Thought Process:  Intact  Orientation:  Full (Time, Place, and Person)  Thought Content:  Hallucinations: Auditory  Suicidal Thoughts:  No  Homicidal Thoughts:  No  Memory:  Immediate;   Fair Recent;   Fair Remote;   Poor  Judgement:  Poor  Insight:  Lacking  Psychomotor Activity:  Decreased  Concentration:  Fair  Recall:  [employer]Fair  Fund of Knowledge:Fair  Language: Fair  Akathisia:  No  Handed:  Right  AIMS (if indicated):     Assets:  Communication Skills Desire for Improvement  Sleep:  Number of Hours: 6.25   Musculoskeletal: Strength & Muscle Tone: within normal limits Gait & Station: normal Patient leans: N/A  Current Medications: Current Facility-Administered Medications  Medication Dose Route Frequency Provider Last Rate Last Dose  . acetaminophen (TYLENOL) tablet 650 mg  650 mg Oral Q6H PRN Benjaman PottGerald D Taylor, MD   650 mg at 08/05/13 1720  . alum & mag hydroxide-simeth (MAALOX/MYLANTA) 200-200-20 MG/5ML suspension 30 mL  30 mL Oral Q4H PRN Benjaman PottGerald D Taylor, MD      . ARIPiprazole (ABILIFY) tablet 5 mg  5 mg Oral BID [name]   5 mg  at 08/06/13 1191  . diphenhydrAMINE (BENADRYL) capsule 50 mg  50 mg Oral Q6H PRN Taylor Hough, PA-C   50 mg at 08/05/13 0844  . gabapentin (NEURONTIN) capsule 100 mg  100 mg Oral BID Taylor Kaufmann, NP   100 mg at 08/06/13 0806  . insulin aspart (novoLOG) injection 0-15 Units  0-15 Units Subcutaneous TID WC Taylor Kaufmann, NP   2 Units at 08/05/13 1147  . insulin aspart (novoLOG) injection 4  Units  4 Units Subcutaneous TID WC Taylor Kaufmann, NP   4 Units at 08/06/13 [phone]  . lisinopril (PRINIVIL,ZESTRIL) tablet 20 mg  20 mg Oral Daily Taylor Kaufmann, NP   20 mg at 08/06/13 0806  . magnesium hydroxide (MILK OF MAGNESIA) suspension 30 mL  30 mL Oral Daily PRN Benjaman Pott, MD      . metFORMIN (GLUCOPHAGE) tablet 500 mg  500 mg Oral BID WC Taylor Kaufmann, NP   500 mg at 08/06/13 0807  . nicotine (NICODERM CQ - dosed in mg/24 hours) patch 21 mg  21 mg Transdermal Daily Benjaman Pott, MD   21 mg at 08/06/13 0806  . OLANZapine zydis (ZYPREXA) disintegrating tablet 5 mg  5 mg Oral Q8H PRN Taylor Kaufmann, NP   5 mg at 08/06/13 0809  . traZODone (DESYREL) tablet 100 mg  100 mg Oral QHS PRN Taylor Kaufmann, NP   100 mg at 08/05/13 2120  . trihexyphenidyl (ARTANE) tablet 5 mg  5 mg Oral BID WC [name]        Lab Results:  Results for orders placed during the hospital encounter of 08/04/13 (from the past 48 hour(s))  GLUCOSE, CAPILLARY     Status: Abnormal   Collection Time    08/04/13  4:09 PM      Result Value Range   Glucose-Capillary 156 (*) 70 - 99 mg/dL  GLUCOSE, CAPILLARY     Status: Abnormal   Collection Time    08/04/13  5:16 PM      Result Value Range   Glucose-Capillary 143 (*) 70 - 99 mg/dL  GLUCOSE, CAPILLARY     Status: Abnormal   Collection Time    08/04/13  8:46 PM      Result Value Range   Glucose-Capillary 139 (*) 70 - 99 mg/dL  GLUCOSE, CAPILLARY     Status: Abnormal   Collection Time    08/05/13  6:09 AM      Result Value Range   Glucose-Capillary 118 (*) 70 - 99 mg/dL  GLUCOSE, CAPILLARY     Status: Abnormal   Collection Time    08/05/13 11:33 AM      Result Value Range   Glucose-Capillary 125 (*) 70 - 99 mg/dL   Comment 1 Notify RN    GLUCOSE, CAPILLARY     Status: Abnormal   Collection Time    08/05/13  4:54 PM      Result Value Range   Glucose-Capillary 111 (*) 70 - 99 mg/dL   Comment 1 Documented in Chart     Comment 2 Notify RN    GLUCOSE, CAPILLARY      Status: Abnormal   Collection Time    08/05/13  8:49 PM      Result Value Range   Glucose-Capillary 133 (*) 70 - 99 mg/dL  GLUCOSE, CAPILLARY     Status: Abnormal   Collection Time    08/06/13  6:24 AM      Result Value Range   Glucose-Capillary 110 (*)  70 - 99 mg/dL    Physical Findings: AIMS: Facial and Oral Movements Muscles of Facial Expression: None, normal Lips and Perioral Area: None, normal Jaw: None, normal Tongue: None, normal,Extremity Movements Upper (arms, wrists, hands, fingers): None, normal Lower (legs, knees, ankles, toes): None, normal, Trunk Movements Neck, shoulders, hips: None, normal, Overall Severity Severity of abnormal movements (highest score from questions above): None, normal Incapacitation due to abnormal movements: None, normal Patient's awareness of abnormal movements (rate only patient's report): No Awareness, Dental Status Current problems with teeth and/or dentures?: No Does patient usually wear dentures?: No  CIWA:  CIWA-Ar Total: 1 COWS:  COWS Total Score: 1  Treatment Plan Summary: Daily contact with patient to assess and evaluate symptoms and progress in treatment Medication management  Plan: 1. Continue crisis management and stabilization.  2. Medication management: Start Abilify 5 mg BID for psychosis, Artane 5 mg BID for EPS prevention.  3. Encouraged patient to attend groups and participate in group counseling sessions and activities.  4. Discharge plan in progress.  5. Continue current treatment plan. Anticipate if patient tolerates oral Abilify over the weekend will then by started on Abilify Maintena on Monday 08/09/12/  6. Address health issues: Monitor CBG achs currently values ranging from 110-133. Continue Metformin 500 mg BID, Novolog SSI, Novolog 4 units TID with meals for improved diabetic control. Continue Lisinopril 20 mg daily for treatment of Hypertension, Neurontin 100 mg BID for neuropathic pain.   Medical Decision  Making Problem Points:  Established problem, stable/improving (1), Review of last therapy session (1) and Review of psycho-social stressors (1) Data Points:  Review or order clinical lab tests (1) Review of medication regiment & side effects (2) Review of new medications or change in dosage (2)  I certify that inpatient services furnished can reasonably be expected to improve the patient's condition.   Taylor Kaufmann NP-C 08/06/2013, 11:25 AM  Patient seen, evaluated and I agree with notes by Nurse Practitioner. Thedore Mins, MD

## 2013-08-06 NOTE — Progress Notes (Signed)
Patient ID: Sharolyn DouglasFredrick L Montag, male   DOB: May 02, 1961, 53 y.o.   MRN: [id number] 08-06-2013 nursing shift note: D: pt is still having some on going psychosis. He complains of auditory hallucinations. This pt does seem  lethargic and apathetic about his care at [facility].  He did deny any si/hi or verbal hallucinations. A: he got his Zyprexa prn this am at 0809 to help him with his psychosis. R: on his inventory sheet he wrote: slept well, appetite good, energy normal, attention improving with his depression at 5 and hopelessness at 3. W/d symptoms have been cravings. He stated he has had pain in the 24 hrs,  but has not had any complaints of pain today. After discharge he plans to "exercise". RN will monitor and Q 15 min ck's continue.

## 2013-08-07 DIAGNOSIS — F1420 Cocaine dependence, uncomplicated: Secondary | ICD-10-CM

## 2013-08-07 LAB — GLUCOSE, CAPILLARY
GLUCOSE-CAPILLARY: 126 mg/dL — AB (ref 70–99)
GLUCOSE-CAPILLARY: 150 mg/dL — AB (ref 70–99)
Glucose-Capillary: 168 mg/dL — ABNORMAL HIGH (ref 70–99)
Glucose-Capillary: 112 mg/dL — ABNORMAL HIGH (ref 70–99)

## 2013-08-07 MED ORDER — FLUPHENAZINE HCL 5 MG PO TABS
5.0000 mg | ORAL_TABLET | Freq: Two times a day (BID) | ORAL | Status: DC
Start: 2013-08-07 — End: 2013-08-09
  Administered 2013-08-07 – 2013-08-09 (×4): 5 mg via ORAL
  Filled 2013-08-07 (×7): qty 1

## 2013-08-07 NOTE — Progress Notes (Signed)
[name] is helping him.  He feels more irritable and paranoid.  He did participate some groups but remains very isolative and withdrawn.  He continued to endorse suicidal thoughts and cannot contract for safety.  He remains guarded and had a limited interaction with staff and other patients.  He admitted hallucination and very paranoid around people.  Diagnosis:   DSM5: Schizophrenia Disorders: Schizophrenia, paranoid type  Obsessive-Compulsive Disorders:  Trauma-Stressor Disorders:  Substance/Addictive Disorders: Cocaine abuse  Depressive Disorders:  AXIS I: Paranoid schizophrenia, Cocaine dependence  AXIS II: Cluster B Traits  AXIS III:  Past Medical History   Diagnosis  Date   .  Diabetes mellitus without complication    .  Hypertension    .  Schizophrenia     AXIS IV: housing problems and problems related to social environment  AXIS V: 41-50 serious symptoms  ADL's:  Intact  Sleep: Fair  Appetite:  Fair  Suicidal Ideation:  Denies  Homicidal Ideation:  Denies  AEB (as evidenced by):  Psychiatric Specialty Exam: Physical Exam  Review of Systems  Constitutional: Negative.   HENT: Negative.   Eyes: Negative.   Respiratory: Negative.   Cardiovascular: Negative.   Gastrointestinal: Negative.   Genitourinary: Negative.   Musculoskeletal: Negative.   Skin: Negative.   Neurological: Negative.   Endo/Heme/Allergies: Negative.   Psychiatric/Behavioral: Positive for depression, hallucinations and substance abuse. Negative for suicidal ideas and memory loss. The patient is nervous/anxious. The patient does not have insomnia.     Blood pressure 166/108, pulse 91, temperature  98.6 F (37 C), temperature source Oral, resp. rate 18, weight 183 lb (83.008 kg).Body mass index is 27.01 kg/(m^2).  General Appearance: Disheveled  Eye SolicitorContact::  Fair  Speech:  Clear and Coherent  Volume:  Decreased  Mood:  Dysphoric  Affect:  Blunt  Thought Process:  Intact  Orientation:  Full (Time, Place, and Person)  Thought Content:  Hallucinations: Auditory  Suicidal Thoughts:  No  Homicidal Thoughts:  No  Memory:  Immediate;   Fair Recent;   Fair Remote;   Poor  Judgement:  Poor  Insight:  Lacking  Psychomotor Activity:  Decreased  Concentration:  Fair  Recall:  [employer]Fair  Fund of Knowledge:Fair  Language: Fair  Akathisia:  No  Handed:  Right  AIMS (if indicated):     Assets:  Communication Skills Desire for Improvement  Sleep:  Number of Hours: 6.25   Musculoskeletal: Strength & Muscle Tone: within normal limits Gait & Station: normal Patient leans: N/A  Current Medications: Current Facility-Administered Medications  Medication Dose Route Frequency Provider Last Rate Last Dose  . acetaminophen (TYLENOL) tablet 650 mg  650 mg Oral Q6H PRN Benjaman PottGerald D Taylor, MD   650 mg at 08/05/13 1720  . alum & mag hydroxide-simeth (MAALOX/MYLANTA) 200-200-20 MG/5ML suspension 30 mL  30 mL Oral Q4H PRN Benjaman PottGerald D Taylor, MD      . ARIPiprazole ([name]) tablet 5 mg  5 mg Oral BID Mojeed Akintayo   5 mg at 08/07/13 [phone]  . diphenhydrAMINE (BENADRYL) capsule 50 mg  50 mg Oral Q6H PRN Kerry HoughSpencer E Simon, PA-C   50 mg at 08/05/13 0844  .  gabapentin (NEURONTIN) capsule 100 mg  100 mg Oral BID Fransisca Kaufmann, NP   100 mg at 08/07/13 1610  . insulin aspart (novoLOG) injection 0-15 Units  0-15 Units Subcutaneous TID WC Fransisca Kaufmann, NP   2 Units at 08/07/13 [phone]  . insulin aspart (novoLOG) injection 4 Units  4 Units Subcutaneous TID WC Fransisca Kaufmann, NP      . lisinopril (PRINIVIL,ZESTRIL) tablet 20 mg  20 mg Oral Daily Fransisca Kaufmann, NP   20 mg at 08/07/13 5409  . magnesium hydroxide (MILK OF MAGNESIA)  suspension 30 mL  30 mL Oral Daily PRN Benjaman Pott, MD      . metFORMIN (GLUCOPHAGE) tablet 500 mg  500 mg Oral BID WC Fransisca Kaufmann, NP   500 mg at 08/07/13 8119  . nicotine (NICODERM CQ - dosed in mg/24 hours) patch 21 mg  21 mg Transdermal Daily Benjaman Pott, MD   21 mg at 08/07/13 1478  . OLANZapine zydis (ZYPREXA) disintegrating tablet 5 mg  5 mg Oral Q8H PRN Fransisca Kaufmann, NP   5 mg at 08/07/13 0824  . traZODone (DESYREL) tablet 100 mg  100 mg Oral QHS PRN Fransisca Kaufmann, NP   100 mg at 08/05/13 2120  . trihexyphenidyl (ARTANE) tablet 5 mg  5 mg Oral BID WC Mojeed Akintayo   5 mg at 08/07/13 2956    Lab Results:  Results for orders placed during the hospital encounter of 08/04/13 (from the past 48 hour(s))  GLUCOSE, CAPILLARY     Status: Abnormal   Collection Time    08/05/13  4:54 PM      Result Value Range   Glucose-Capillary 111 (*) 70 - 99 mg/dL   Comment 1 Documented in Chart     Comment 2 Notify RN    GLUCOSE, CAPILLARY     Status: Abnormal   Collection Time    08/05/13  8:49 PM      Result Value Range   Glucose-Capillary 133 (*) 70 - 99 mg/dL  GLUCOSE, CAPILLARY     Status: Abnormal   Collection Time    08/06/13  6:24 AM      Result Value Range   Glucose-Capillary 110 (*) 70 - 99 mg/dL  GLUCOSE, CAPILLARY     Status: Abnormal   Collection Time    08/06/13 11:39 AM      Result Value Range   Glucose-Capillary 125 (*) 70 - 99 mg/dL   Comment 1 Notify RN    GLUCOSE, CAPILLARY     Status: Abnormal   Collection Time    08/06/13  4:57 PM      Result Value Range   Glucose-Capillary 157 (*) 70 - 99 mg/dL  GLUCOSE, CAPILLARY     Status: Abnormal   Collection Time    08/06/13  9:08 PM      Result Value Range   Glucose-Capillary 133 (*) 70 - 99 mg/dL  GLUCOSE, CAPILLARY     Status: Abnormal   Collection Time    08/07/13  6:14 AM      Result Value Range   Glucose-Capillary 126 (*) 70 - 99 mg/dL    Physical Findings: AIMS: Facial and Oral Movements Muscles of Facial  Expression: None, normal Lips and Perioral Area: None, normal Jaw: None, normal Tongue: None, normal,Extremity Movements Upper (arms, wrists, hands, fingers): None, normal Lower (legs, knees, ankles, toes): None, normal, Trunk Movements Neck, shoulders, hips: None, normal, Overall Severity Severity of abnormal movements (highest score from questions above):  None, normal Incapacitation due to abnormal movements: None, normal Patient's awareness of abnormal movements (rate only patient's report): No Awareness, Dental Status Current problems with teeth and/or dentures?: No Does patient usually wear dentures?: No  CIWA:  CIWA-Ar Total: 1 COWS:  COWS Total Score: 1  Treatment Plan Summary: Daily contact with patient to assess and evaluate symptoms and progress in treatment Medication management  Plan: 1. Continue crisis management and stabilization.  2. Medication management: Discontinue [name] and we would start Prolixin 5 mg twice a day.    3. Encouraged patient to attend groups and participate in group counseling sessions and activities.  4. Discharge plan in progress.  5. patient used to be on Prolixin oral and decanoate in the past.  6. Address health issues: Monitor CBG achs currently values ranging from 110-133. Continue Metformin 500 mg BID, Novolog SSI, Novolog 4 units TID with meals for improved diabetic control. Continue Lisinopril 20 mg daily for treatment of Hypertension, Neurontin 100 mg BID for neuropathic pain.   Medical Decision Making Problem Points:  Established problem, stable/improving (1), Established problem, worsening (2), Review of last therapy session (1) and Review of psycho-social stressors (1) Data Points:  Review or order clinical lab tests (1) Review of medication regiment & side effects (2) Review of new medications or change in dosage (2)  I certify that inpatient services furnished can reasonably be expected to improve the patient's condition.    Matrice Herro T.  08/07/2013, 11:55 AM

## 2013-08-07 NOTE — Progress Notes (Signed)
Patient ID: Taylor DouglasFredrick L [name], male   DOB: [birth year], 53 y.o.   MRN: [id number] Psychoeducational Group Note  Date:  08/07/2013 Time:0915am  Group Topic/Focus:  Identifying Needs:   The focus of this group is to help patients identify their personal needs that have been historically problematic and identify healthy behaviors to address their needs.  Participation Level:  Did Not Attend  Participation Quality:    Affect: Cognitive:  Insight:   Engagement in Group: Additional Comments:  Unwilling to participate.   [name], [name] 08/07/2013,9:48 AM

## 2013-08-07 NOTE — Progress Notes (Signed)
Adult Psychoeducational Group Note  Date:  08/07/2013 Time:  8:00 pm  Group Topic/Focus:  Wrap-Up Group:   The focus of this group is to help patients review their daily goal of treatment and discuss progress on daily workbooks.  Participation Level:  Active  Participation Quality:  Appropriate and Sharing  Affect:  Appropriate  Cognitive:  Appropriate  Insight: Appropriate  Engagement in Group:  Engaged  Modes of Intervention:  Discussion, Education, Socialization and Support  Additional Comments:  Pt stated that his day has been good. Pt stated that he want to stop smoking crack and attend [facility]. Pt stated that he wants to attend all his groups. Pt stated that self control and his religion are his coping skills.   Laural BenesJohnson, Leandrea Ackley 08/07/2013, 9:47 PM

## 2013-08-07 NOTE — Progress Notes (Signed)
Patient ID: Taylor DouglasFredrick L Gallon, male   DOB: [birth year], 53 y.o.   MRN: [id number] 08-07-13 nursing shift note: D: This pt has remained in bed most of the day. He is not going to groups. He stated he is having trouble sleeping at night and is still hearing voices. He is denying any si/hi and visual hallucinations.  A: he was seen by the MD today. No medications changes so far. Staff continues to encourage this patient. R: on his inventory sheet he wrote: slept well, appetite good, energy normal, attention improving with his depression and hopelessness both at 7. W/d symptoms have been cravings and no physical problems and no pain. His plan is to " stay out of trouble". RN will monitor and Q 15 min ck's continue.

## 2013-08-07 NOTE — Progress Notes (Signed)
Patient ID: Taylor DouglasFredrick L [name], male   DOB: [birth year], 53 y.o.   MRN: [id number] Psychoeducational Group Note  Date:  08/07/2013 [id number]:0945am  Group Topic/Focus:  Identifying Needs:   The focus of this group is to help patients identify their personal needs that have been historically problematic and identify healthy behaviors to address their needs.  Participation Level:  Did Not Attend  Participation Quality:    Affect:  Cognitive: Insight:  Engagement in Group: Additional Comments:  Coping skills.   [name], [name] 08/07/2013,10:21 AM

## 2013-08-07 NOTE — Progress Notes (Signed)
Patient ID: Taylor DouglasFredrick L Mauch, male   DOB: 12-Feb-1961, 53 y.o.   MRN: [id number] D. The patient remained resting in bed with his eyes closed all evening. A. Attempt made to awaken patient for evening group. Awakened briefly to obtain a CBG. R. Remained resting in bed all evening. Did not attend evening group. Compliant with CBG.

## 2013-08-07 NOTE — [facility] Group Notes (Signed)
[facility] Group Notes:  (Clinical Social Work)  08/07/2013  11:00-11:45AM  Summary of Progress/Problems:   The main focus of today's process group was for the patient to identify ways in which they have in the past sabotaged their own recovery and reasons they may have done this/what they received from doing it.  We then worked to identify a specific plan to avoid doing this when discharged from the hospital for this admission.  The patient expressed that he has been using drugs and substances on the street, and that he did not have food at home, so he called the ambulance rather than going home.  He did not display comprehension of group topic.  Type of Therapy:  Group Therapy - Process  Participation Level:  Active  Participation Quality:  Attentive and Sharing  Affect:  Blunted and Depressed  Cognitive:  Confused  Insight:  Limited  Engagement in Therapy:  Limited  Modes of Intervention:  Clarification, Education, Exploration, Discussion  Ambrose MantleMareida Grossman-Orr, LCSW 08/07/2013, 12:33 PM

## 2013-08-08 LAB — GLUCOSE, CAPILLARY
GLUCOSE-CAPILLARY: 120 mg/dL — AB (ref 70–99)
GLUCOSE-CAPILLARY: 150 mg/dL — AB (ref 70–99)
Glucose-Capillary: 124 mg/dL — ABNORMAL HIGH (ref 70–99)
Glucose-Capillary: 134 mg/dL — ABNORMAL HIGH (ref 70–99)

## 2013-08-08 MED ORDER — HYDROXYZINE HCL 50 MG PO TABS
50.0000 mg | ORAL_TABLET | Freq: Every evening | ORAL | Status: DC | PRN
  Administered 2013-08-08 – 2013-08-09 (×2): 50 mg via ORAL
  Filled 2013-08-08: qty 6
  Filled 2013-08-08 (×2): qty 1

## 2013-08-08 NOTE — Progress Notes (Signed)
Patient ID: [name], male   DOB: [birth year], 53 y.o.   MRN: [id number] D. The patient was up an about the unit this evening. Pleasant and appropriately interacting in the milieu. Stated that he has had over [facility] and been to [facility] multiple times. He reported that he gets a sizeable disability check and food stamps. However, he admits using most of the money to buy drugs. Stated that he then has nothing to live on and takes himself to the hospital. Acknowledged that he is getting too old to live in this matter and would like to be able to stop using drugs. A. Encouraged to stay out of bed and attend evening group. Support and encouragement provided. CBG obtained. R. The patient's plan for recovery is to attend AA/NA meetings. However, when offered, he was not interested in attending any meetings while here in the hospital. CBG=112.

## 2013-08-08 NOTE — Progress Notes (Signed)
[facility]  08/08/2013 12:59 PM [name]  MRN:  [id number]  Subjective:   I did not sleep last night.  However my hallucinations are less intense.     Objective:  Patient seen chart reviewed.  His medications were changed yesterday.  We started him on Prolixin.  He feels less paranoid and less complaining of hallucination however he did not get good sleep.  He was given trazodone and he felt increased trazodone causing more restlessness and insomnia.  He appears irritable, labile and continues to have paranoid thinking.  He still have suicidal thoughts and cannot contract for safety.  He has no tremors or shakes.  He started group and has been very vocal into groups.  Diagnosis:   DSM5: Schizophrenia Disorders: Schizophrenia, paranoid type  Obsessive-Compulsive Disorders:  Trauma-Stressor Disorders:  Substance/Addictive Disorders: Cocaine abuse  Depressive Disorders:  AXIS I: Paranoid schizophrenia, Cocaine dependence  AXIS II: Cluster B Traits  AXIS III:  Past Medical History   Diagnosis  Date   .  Diabetes mellitus without complication    .  Hypertension    .  Schizophrenia     AXIS IV: housing problems and problems related to social environment  AXIS V: 41-50 serious symptoms  ADL's:  Intact  Sleep: Fair  Appetite:  Fair  Suicidal Ideation:  Denies  Homicidal Ideation:  Denies  AEB (as evidenced by):  Psychiatric Specialty Exam: Physical Exam  Review of Systems  Constitutional: Negative.   HENT: Negative.   Eyes: Negative.   Respiratory: Negative.   Cardiovascular: Negative.   Gastrointestinal: Negative.   Genitourinary: Negative.   Musculoskeletal: Negative.   Skin: Negative.   Neurological: Negative.   Endo/Heme/Allergies: Negative.   Psychiatric/Behavioral: Positive for depression, hallucinations and substance abuse. Negative for suicidal ideas and memory loss. The patient is nervous/anxious. The patient does not have insomnia.      Blood pressure 161/93, pulse 95, temperature 98.4 F (36.9 C), temperature source Oral, resp. rate 24, weight 183 lb (83.008 kg).Body mass index is 27.01 kg/(m^2).  General Appearance: Disheveled  Eye SolicitorContact::  Fair  Speech:  Clear and Coherent  Volume:  Decreased  Mood:  Dysphoric  Affect:  Blunt  Thought Process:  Intact  Orientation:  Full (Time, Place, and Person)  Thought Content:  Hallucinations: Auditory  Suicidal Thoughts:  No  Homicidal Thoughts:  No  Memory:  Immediate;   Fair Recent;   Fair Remote;   Poor  Judgement:  Poor  Insight:  Lacking  Psychomotor Activity:  Decreased  Concentration:  Fair  Recall:  FiservFair  Fund of Knowledge:Fair  Language: Fair  Akathisia:  No  Handed:  Right  AIMS (if indicated):     Assets:  Communication Skills Desire for Improvement  Sleep:  Number of Hours: 0.75   Musculoskeletal: Strength & Muscle Tone: within normal limits Gait & Station: normal Patient leans: N/A  Current Medications: Current Facility-Administered Medications  Medication Dose Route Frequency Provider Last Rate Last Dose  . acetaminophen (TYLENOL) tablet 650 mg  650 mg Oral Q6H PRN Benjaman PottGerald D Taylor, MD   650 mg at 08/08/13 0450  . alum & mag hydroxide-simeth (MAALOX/MYLANTA) 200-200-20 MG/5ML suspension 30 mL  30 mL Oral Q4H PRN Benjaman PottGerald D Taylor, MD      . diphenhydrAMINE (BENADRYL) capsule 50 mg  50 mg Oral Q6H PRN Kerry HoughSpencer E Simon, PA-C   50 mg at 08/05/13 0844  . fluPHENAZine (PROLIXIN) tablet 5 mg  5 mg Oral BID  Cleotis Nipper, MD   5 mg at 08/08/13 0741  . gabapentin (NEURONTIN) capsule 100 mg  100 mg Oral BID Fransisca Kaufmann, NP   100 mg at 08/08/13 0740  . insulin aspart (novoLOG) injection 0-15 Units  0-15 Units Subcutaneous TID WC Fransisca Kaufmann, NP   2 Units at 08/08/13 1144  . insulin aspart (novoLOG) injection 4 Units  4 Units Subcutaneous TID WC Fransisca Kaufmann, NP   4 Units at 08/08/13 1144  . lisinopril (PRINIVIL,ZESTRIL) tablet 20 mg  20 mg Oral Daily Fransisca Kaufmann, NP   20 mg at 08/08/13 0741  . magnesium hydroxide (MILK OF MAGNESIA) suspension 30 mL  30 mL Oral Daily PRN Benjaman Pott, MD      . metFORMIN (GLUCOPHAGE) tablet 500 mg  500 mg Oral BID WC Fransisca Kaufmann, NP   500 mg at 08/08/13 0740  . nicotine (NICODERM CQ - dosed in mg/24 hours) patch 21 mg  21 mg Transdermal Daily Benjaman Pott, MD   21 mg at 08/08/13 0740  . OLANZapine zydis (ZYPREXA) disintegrating tablet 5 mg  5 mg Oral Q8H PRN Fransisca Kaufmann, NP   5 mg at 08/07/13 0824  . traZODone (DESYREL) tablet 100 mg  100 mg Oral QHS PRN Fransisca Kaufmann, NP   100 mg at 08/07/13 2208  . trihexyphenidyl (ARTANE) tablet 5 mg  5 mg Oral BID WC Mojeed Akintayo   5 mg at 08/08/13 0740    Lab Results:  Results for orders placed during the hospital encounter of 08/04/13 (from the past 48 hour(s))  GLUCOSE, CAPILLARY     Status: Abnormal   Collection Time    08/06/13  4:57 PM      Result Value Range   Glucose-Capillary 157 (*) 70 - 99 mg/dL  GLUCOSE, CAPILLARY     Status: Abnormal   Collection Time    08/06/13  9:08 PM      Result Value Range   Glucose-Capillary 133 (*) 70 - 99 mg/dL  GLUCOSE, CAPILLARY     Status: Abnormal   Collection Time    08/07/13  6:14 AM      Result Value Range   Glucose-Capillary 126 (*) 70 - 99 mg/dL  GLUCOSE, CAPILLARY     Status: Abnormal   Collection Time    08/07/13 11:56 AM      Result Value Range   Glucose-Capillary 150 (*) 70 - 99 mg/dL  GLUCOSE, CAPILLARY     Status: Abnormal   Collection Time    08/07/13  4:40 PM      Result Value Range   Glucose-Capillary 168 (*) 70 - 99 mg/dL   Comment 1 Documented in Chart     Comment 2 Notify RN    GLUCOSE, CAPILLARY     Status: Abnormal   Collection Time    08/07/13  9:21 PM      Result Value Range   Glucose-Capillary 112 (*) 70 - 99 mg/dL  GLUCOSE, CAPILLARY     Status: Abnormal   Collection Time    08/08/13  6:13 AM      Result Value Range   Glucose-Capillary 124 (*) 70 - 99 mg/dL  GLUCOSE, CAPILLARY      Status: Abnormal   Collection Time    08/08/13 11:33 AM      Result Value Range   Glucose-Capillary 134 (*) 70 - 99 mg/dL    Physical Findings: AIMS: Facial and Oral Movements Muscles of Facial Expression: None, normal Lips  and Perioral Area: None, normal Jaw: None, normal Tongue: None, normal,Extremity Movements Upper (arms, wrists, hands, fingers): None, normal Lower (legs, knees, ankles, toes): None, normal, Trunk Movements Neck, shoulders, hips: None, normal, Overall Severity Severity of abnormal movements (highest score from questions above): None, normal Incapacitation due to abnormal movements: None, normal Patient's awareness of abnormal movements (rate only patient's report): No Awareness, Dental Status Current problems with teeth and/or dentures?: No Does patient usually wear dentures?: No  CIWA:  CIWA-Ar Total: 1 COWS:  COWS Total Score: 1  Treatment Plan Summary: Daily contact with patient to assess and evaluate symptoms and progress in treatment Medication management  Plan: 1. Continue crisis management and stabilization.  2. Medication management: Continue Prolixin 5 mg twice a day.   Discontinue trazodone and try Vistaril at bedtime for insomnia. 3. Encouraged patient to attend groups and participate in group counseling sessions and activities.  4. Discharge plan in progress.  5. patient used to be on Prolixin oral and decanoate in the past.  6. Address health issues: Monitor CBG achs currently values ranging from 110-133. Continue Metformin 500 mg BID, Novolog SSI, Novolog 4 units TID with meals for improved diabetic control. Continue Lisinopril 20 mg daily for treatment of Hypertension, Neurontin 100 mg BID for neuropathic pain.   Medical Decision Making Problem Points:  Established problem, stable/improving (1), Established problem, worsening (2), Review of last therapy session (1) and Review of psycho-social stressors (1) Data Points:  Review or order  clinical lab tests (1) Review of medication regiment & side effects (2) Review of new medications or change in dosage (2)  I certify that inpatient services furnished can reasonably be expected to improve the patient's condition.   Oryon Gary T.  08/08/2013, 12:59 PM

## 2013-08-08 NOTE — [facility] Group Notes (Signed)
[facility] Group Notes:  (Clinical Social Work)  08/08/2013   11:15am-12:00pm  Summary of Progress/Problems:  The main focus of today's process group was to listen to a variety of genres of music and to identify that different types of music provoke different responses.  The patient then was able to identify personally what was soothing for them, as well as energizing.  The patient expressed understanding of concepts, as well as knowledge of how each type of music affected him and how this can be used at home as a wellness/recovery tool.  He was eager for this group at the beginning; however, when another patient became persistently intrusive, he left the room apologetically.  Type of Therapy:  Music Therapy   Participation Level:  Active  Participation Quality:  Attentive  Affect:  Appropriate  Cognitive:  Hallucinating  Insight:  Engaged  Engagement in Therapy:  Engaged  Modes of Intervention:   Activity, Exploration  Ambrose MantleMareida Grossman-Orr, LCSW 08/08/2013, 12:30pm

## 2013-08-08 NOTE — ED Provider Notes (Signed)
Medical screening examination/treatment/procedure(s) were conducted as a shared visit with non-physician practitioner(s) and myself.  I personally evaluated the patient during the encounter.  EKG Interpretation   None       Pt with hx schizophrenia, states recent hearing voices, c/w prior psychiatric symptoms. Denies SI/HI.  Pt alert, content. No distress. Labs. Psych team eval.   Suzi RootsKevin E Lena Gores, MD 08/08/13 [phone]

## 2013-08-08 NOTE — Progress Notes (Signed)
Patient ID: [name], male   DOB: [birth year], 53 y.o.   MRN: [id number] 08/08/2013 1045 Data:  Patient was able to fill out self inventory sheets. Patient is in a manic state today often rambling and tangential in conversation.  Patient was having difficulty sleeping las night and requested medication which helped.  Appetite is improving from yesterday.  Patient stated a goal today of attending group therapy.  Action:  Patient given medication as prescribed.  Patient encouraged to attend group therapy sessions.  Patient closely monitored by staff with q 15 min checks.  Response:  Patient took medications without difficulty.  Patient did attend group therapy session today.  RN and staff with continue to monitor patient for q 15 minute checks.

## 2013-08-08 NOTE — Progress Notes (Signed)
Patient ID: Taylor DouglasFredrick L [name], male   DOB: 03-Oct-1960, 53 y.o.   MRN: [id number] Psychoeducational Group Note  Date:  08/08/2013 Time:  0930am  Group Topic/Focus:  Making Healthy Choices:   The focus of this group is to help patients identify negative/unhealthy choices they were using prior to admission and identify positive/healthier coping strategies to replace them upon discharge.  Participation Level:  Active  Participation Quality:  Redirectable  Affect:  Excited  Cognitive:  Appropriate  Insight:  Off Topic  Engagement in Group:  Off Topic  Additional Comments:  Inventory and healthy support systems.   [name], Mayrene Bastarache Brooks 08/08/2013,10:49 AM

## 2013-08-08 NOTE — Progress Notes (Signed)
Adult Psychoeducational Group Note  Date:  08/08/2013 Time:  10:17 PM  Group Topic/Focus:  Wrap-Up Group:   The focus of this group is to help patients review their daily goal of treatment and discuss progress on daily workbooks.  Participation Level:  Minimal  Participation Quality:  Appropriate  Affect:  Appropriate  Cognitive:  Appropriate  Insight: Limited  Engagement in Group:  Engaged  Modes of Intervention:  Support  Additional Comments:  Patient attended and participated in group tonight. He reports that today he caught up with his writing of prayers. He reports that his support system was his cousin, grandchildren and his father.  Lita MainsFrancis, Alyxis Grippi [facility] 08/08/2013, 10:17 PM

## 2013-08-09 LAB — GLUCOSE, CAPILLARY
Glucose-Capillary: 120 mg/dL — ABNORMAL HIGH (ref 70–99)
Glucose-Capillary: 112 mg/dL — ABNORMAL HIGH (ref 70–99)
Glucose-Capillary: 123 mg/dL — ABNORMAL HIGH (ref 70–99)
Glucose-Capillary: 140 mg/dL — ABNORMAL HIGH (ref 70–99)

## 2013-08-09 MED ORDER — DIPHENHYDRAMINE HCL 50 MG/ML IJ SOLN
50.0000 mg | Freq: Once | INTRAMUSCULAR | Status: AC
  Administered 2013-08-09: 50 mg via INTRAMUSCULAR
  Filled 2013-08-09 (×2): qty 1

## 2013-08-09 MED ORDER — ARIPIPRAZOLE 5 MG PO TABS
5.0000 mg | ORAL_TABLET | Freq: Every day | ORAL | Status: DC
  Administered 2013-08-09: 5 mg via ORAL
  Filled 2013-08-09 (×2): qty 1

## 2013-08-09 MED ORDER — INSULIN ASPART 100 UNIT/ML ~~LOC~~ SOLN: 4.0000 [IU] | Freq: Three times a day (TID) | SUBCUTANEOUS | Status: DC

## 2013-08-09 MED ORDER — INSULIN ASPART 100 UNIT/ML ~~LOC~~ SOLN
6.0000 [IU] | Freq: Three times a day (TID) | SUBCUTANEOUS | Status: DC
Start: 2013-08-09 — End: 2013-08-10
  Administered 2013-08-10: 6 [IU] via SUBCUTANEOUS

## 2013-08-09 MED ORDER — ARIPIPRAZOLE ER 400 MG IM SUSR
400.0000 mg | INTRAMUSCULAR | Status: DC
  Administered 2013-08-09: 400 mg via INTRAMUSCULAR

## 2013-08-09 NOTE — Progress Notes (Signed)
Patient ID: [name], male   DOB: 09-29-1960, 53 y.o.   MRN: [id number] [facility] MD Progress Note  08/09/2013 10:52 AM [name]  MRN:  [id number]  Subjective: " I want you to get me on monthly injection because I know for sure that I will not take my medications the way you want when I am discharged.'' Objective:  Patient seen chart is reviewed. Patient denies suicidal thoughts but reports ongoing paranoia, mood lability and intermittent auditory hallucinations. He states that Prolixin makes him feel restless, anxious and fidgety. He reports that he would rather take Abilify shot. He statse that he has a bad experience with Haloperidol. He endorsed sleeping better since his Trazodone was switched to Vistaril. Patient denies suicidal/homicidal thoughts. He also denies cravings for drugs and alcohol. Diagnosis:   DSM5: Schizophrenia Disorders: Schizophrenia, paranoid type  Obsessive-Compulsive Disorders:  Trauma-Stressor Disorders:  Substance/Addictive Disorders: Cocaine abuse  Depressive Disorders:  AXIS I: Paranoid schizophrenia, Cocaine dependence  AXIS II: Cluster B Traits  AXIS III:  Past Medical History   Diagnosis  Date   .  Diabetes mellitus without complication    .  Hypertension    .  Schizophrenia     AXIS IV: housing problems and problems related to social environment  AXIS V: 41-50 serious symptoms  ADL's:  Intact  Sleep: Fair  Appetite:  Fair  Suicidal Ideation:  Denies  Homicidal Ideation:  Denies  AEB (as evidenced by):  Psychiatric Specialty Exam: Physical Exam  Review of Systems  Constitutional: Negative.   HENT: Negative.   Eyes: Negative.   Respiratory: Negative.   Cardiovascular: Negative.   Gastrointestinal: Negative.   Genitourinary: Negative.   Musculoskeletal: Negative.   Skin: Negative.   Neurological: Negative.   Endo/Heme/Allergies: Negative.   Psychiatric/Behavioral: Positive for depression, hallucinations and substance  abuse. Negative for suicidal ideas and memory loss. The patient is nervous/anxious. The patient does not have insomnia.     Blood pressure 163/99, pulse 82, temperature 98.4 F (36.9 C), temperature source Oral, resp. rate 20, weight 83.008 kg (183 lb).Body mass index is 27.01 kg/(m^2).  General Appearance: fairly groomed  Patent attorney::  Fair  Speech:  Clear and Coherent  Volume:  Decreased  Mood:  Dysphoric  Affect:  Blunt  Thought Process:  Intact  Orientation:  Full (Time, Place, and Person)  Thought Content:  Hallucinations: Auditory  Suicidal Thoughts:  No  Homicidal Thoughts:  No  Memory:  Immediate;   Fair Recent;   Fair Remote;   Poor  Judgement:  Poor  Insight:  Lacking  Psychomotor Activity:  Decreased  Concentration:  Fair  Recall:  [employer] of Knowledge:Fair  Language: Fair  Akathisia:  No  Handed:  Right  AIMS (if indicated):     Assets:  Communication Skills Desire for Improvement  Sleep:  Number of Hours: 6.5   Musculoskeletal: Strength & Muscle Tone: within normal limits Gait & Station: normal Patient leans: N/A  Current Medications: Current Facility-Administered Medications  Medication Dose Route Frequency Provider Last Rate Last Dose  . acetaminophen (TYLENOL) tablet 650 mg  650 mg Oral Q6H PRN Benjaman Pott, MD   650 mg at 08/09/13 1036  . alum & mag hydroxide-simeth (MAALOX/MYLANTA) 200-200-20 MG/5ML suspension 30 mL  30 mL Oral Q4H PRN Benjaman Pott, MD      . ARIPiprazole (ABILIFY) tablet 5 mg  5 mg Oral QHS Chantale Leugers      . ARIPiprazole SUSR 400 mg  400 mg Intramuscular Q28 days Sukhman Martine      . diphenhydrAMINE (BENADRYL) capsule 50 mg  50 mg Oral Q6H PRN Kerry HoughSpencer E Simon, PA-C   50 mg at 08/05/13 0844  . diphenhydrAMINE (BENADRYL) injection 50 mg  50 mg Intramuscular Once Alaya Iverson      . gabapentin (NEURONTIN) capsule 100 mg  100 mg Oral BID Fransisca KaufmannLaura Davis, NP   100 mg at 08/09/13 0841  . hydrOXYzine (ATARAX/VISTARIL) tablet  50 mg  50 mg Oral QHS PRN,MR X 1 Cleotis NipperSyed T Arfeen, MD   50 mg at 08/08/13 2115  . insulin aspart (novoLOG) injection 0-15 Units  0-15 Units Subcutaneous TID WC Fransisca KaufmannLaura Davis, NP   2 Units at 08/08/13 1144  . insulin aspart (novoLOG) injection 4 Units  4 Units Subcutaneous TID WC Fransisca KaufmannLaura Davis, NP   4 Units at 08/09/13 0631  . lisinopril (PRINIVIL,ZESTRIL) tablet 20 mg  20 mg Oral Daily Fransisca KaufmannLaura Davis, NP   20 mg at 08/09/13 0841  . magnesium hydroxide (MILK OF MAGNESIA) suspension 30 mL  30 mL Oral Daily PRN Benjaman PottGerald D Taylor, MD   30 mL at 08/08/13 1619  . metFORMIN (GLUCOPHAGE) tablet 500 mg  500 mg Oral BID WC Fransisca KaufmannLaura Davis, NP   500 mg at 08/09/13 0841  . nicotine (NICODERM CQ - dosed in mg/24 hours) patch 21 mg  21 mg Transdermal Daily Benjaman PottGerald D Taylor, MD   21 mg at 08/09/13 [phone]  . OLANZapine zydis (ZYPREXA) disintegrating tablet 5 mg  5 mg Oral Q8H PRN Fransisca KaufmannLaura Davis, NP   5 mg at 08/07/13 0824  . trihexyphenidyl (ARTANE) tablet 5 mg  5 mg Oral BID WC Vertie Dibbern   5 mg at 08/09/13 [id number]    Lab Results:  Results for orders placed during the hospital encounter of 08/04/13 (from the past 48 hour(s))  GLUCOSE, CAPILLARY     Status: Abnormal   Collection Time    08/07/13 11:56 AM      Result Value Range   Glucose-Capillary 150 (*) 70 - 99 mg/dL  GLUCOSE, CAPILLARY     Status: Abnormal   Collection Time    08/07/13  4:40 PM      Result Value Range   Glucose-Capillary 168 (*) 70 - 99 mg/dL   Comment 1 Documented in Chart     Comment 2 Notify RN    GLUCOSE, CAPILLARY     Status: Abnormal   Collection Time    08/07/13  9:21 PM      Result Value Range   Glucose-Capillary 112 (*) 70 - 99 mg/dL  GLUCOSE, CAPILLARY     Status: Abnormal   Collection Time    08/08/13  6:13 AM      Result Value Range   Glucose-Capillary 124 (*) 70 - 99 mg/dL  GLUCOSE, CAPILLARY     Status: Abnormal   Collection Time    08/08/13 11:33 AM      Result Value Range   Glucose-Capillary 134 (*) 70 - 99 mg/dL  GLUCOSE,  CAPILLARY     Status: Abnormal   Collection Time    08/08/13  5:09 PM      Result Value Range   Glucose-Capillary 120 (*) 70 - 99 mg/dL   Comment 1 Documented in Chart     Comment 2 Notify RN    GLUCOSE, CAPILLARY     Status: Abnormal   Collection Time    08/08/13  9:11 PM      Result  Value Range   Glucose-Capillary 150 (*) 70 - 99 mg/dL  GLUCOSE, CAPILLARY     Status: Abnormal   Collection Time    08/09/13  6:27 AM      Result Value Range   Glucose-Capillary 120 (*) 70 - 99 mg/dL    Physical Findings: AIMS: Facial and Oral Movements Muscles of Facial Expression: None, normal Lips and Perioral Area: None, normal Jaw: None, normal Tongue: None, normal,Extremity Movements Upper (arms, wrists, hands, fingers): None, normal Lower (legs, knees, ankles, toes): None, normal, Trunk Movements Neck, shoulders, hips: None, normal, Overall Severity Severity of abnormal movements (highest score from questions above): None, normal Incapacitation due to abnormal movements: None, normal Patient's awareness of abnormal movements (rate only patient's report): No Awareness, Dental Status Current problems with teeth and/or dentures?: No Does patient usually wear dentures?: No  CIWA:  CIWA-Ar Total: 1 COWS:  COWS Total Score: 1  Treatment Plan Summary: Daily contact with patient to assess and evaluate symptoms and progress in treatment Medication management  Plan: 1. Continue crisis management and stabilization.  2. Medication management: Discontinue Prolixin 5 mg twice a day.  Continue Vistaril at bedtime for insomnia. Initiate Abilify 5mg  Qhs for mood/psychosis, Abilify Maintenna 400mg  q28 days, 1st dose today plus Benadryl 50mg  IM x 1 dose for schizophrenia/EPS prevention. 3. Encouraged patient to attend groups and participate in group counseling sessions and activities.  4. Discharge plan in progress.  5. Address health issues: Monitor CBG achs currently values ranging from 110-133.  Continue Metformin 500 mg BID, Novolog SSI, Novolog 4 units TID with meals for improved diabetic control. Continue Lisinopril 20 mg daily for treatment of Hypertension, Neurontin 100 mg BID for neuropathic pain.   Medical Decision Making Problem Points:  Established problem, improving (1), Established problem, worsening (2), Review of last therapy session (1) and Review of psycho-social stressors (1) Data Points:  Review or order clinical lab tests (1) Review of medication regiment & side effects (2) Review of new medications or change in dosage (2)  I certify that inpatient services furnished can reasonably be expected to improve the patient's condition.   Meliah Appleman,MD 08/09/2013, 10:52 AM

## 2013-08-09 NOTE — [facility] Group Notes (Signed)
[facility] LCSW Aftercare Discharge Planning Group Note   2/[facility] 10:12 AM  Participation Quality:    Mood/Affect:  Excited  Depression Rating:    Anxiety Rating:    Thoughts of Suicide:  No Will you contract for safety?   NA  Current AVH:  Denies  Plan for Discharge/Comments:  "Jail rehabilitated me this time.  I could not eat from 5PM-5AM.  That's 12 hrs."  Hopes to d/c today.  "[facility]  I have court on Friday, so not until after Friday."  Transportation Means:  bus  Supports: Motorol[employer]ice  Marisela Line, ShambaughRodney B

## 2013-08-09 NOTE — Progress Notes (Signed)
Patient ID: [name], male   DOB: 1960/09/28, 53 y.o.   MRN: [id number] D: Patient has been up in the milieu today attending groups and participating.  He report auditory hallucinations and depression have decreased.  He is interacting well with staff and others.  He denies any suicidal/homicidal ideations. Patient met with treatment team and discussed injectable Abilify. He is agreeable with getting the injection today. A: Review medications and indications of same.  Safety checks completed every 15 minutes per protocol.  IM injection of 400 mg abilify given and patient tolerated well. R: patient is receptive to staff and his behavior is appropriate.

## 2013-08-09 NOTE — Tx Team (Signed)
  Interdisciplinary Treatment Plan Update   Date Reviewed:  08/09/2013  Time Reviewed:  10:09 AM  Progress in Treatment:   Attending groups: Yes Participating in groups: Yes Taking medication as prescribed: Yes  Tolerating medication: Yes Family/Significant other contact made: Yes  Patient understands diagnosis: Yes  Discussing patient identified problems/goals with staff: Yes Medical problems stabilized or resolved: Yes Denies suicidal/homicidal ideation: Yes Patient has not harmed self or others: Yes  For review of initial/current patient goals, please see plan of care.  Estimated Length of Stay:  D/C tomorrow  Reason for Continuation of Hospitalization:   New Problems/Goals identified:  N/A  Discharge Plan or Barriers:   return home, follow up outpt  Additional Comments:  Attendees:  Signature: Thedore MinsMojeed Akintayo, MD 08/09/2013 10:09 AM   Signature: Richelle Itood Rishawn Walck, LCSW 08/09/2013 10:09 AM  Signature: Fransisca KaufmannLaura Davis, NP 08/09/2013 10:09 AM  Signature: Joslyn Devonaroline Beaudry, RN 08/09/2013 10:09 AM  Signature: Liborio NixonPatrice White, RN 08/09/2013 10:09 AM  Signature:  08/09/2013 10:09 AM  Signature:   08/09/2013 10:09 AM  Signature:    Signature:    Signature:    Signature:    Signature:    Signature:      Scribe for Treatment Team:   Richelle Itood Chanin Frumkin, LCSW  08/09/2013 10:09 AM

## 2013-08-09 NOTE — Care Management Utilization Note (Signed)
   Per State Regulation 482.30  This chart was reviewed for necessity with respect to the patient's Admission/ Duration of stay.  Next review date: 08/12/13  Daryana Whirley Morrison RN, BSN 

## 2013-08-09 NOTE — Progress Notes (Signed)
Patient ID: [name], male   DOB: 04/28/1961, 52 y.o.   MRN: [id number] D. The patient was in a hypomanic state this evening. He was excessively talkative and flirtatious. Stated that he has a court date coming up and stayed up all last night to write down his thoughts.  A. Met with patient to assess. Encouraged to attend evening group. Medicated for sleep. R. During group stated that his grandchildren and his father are his support system.  

## 2013-08-09 NOTE — [facility] Group Notes (Signed)
[facility] LCSW Group Therapy  08/09/2013 1:15 pm  Type of Therapy: Process Group Therapy  Participation Level:  Active  Participation Quality:  Appropriate  Affect:  Flat  Cognitive:  Oriented  Insight:  Improving  Engagement in Group:  Limited  Engagement in Therapy:  Limited  Modes of Intervention:  Activity, Clarification, Education, Problem-solving and Support  Summary of Progress/Problems: Today's group addressed the issue of overcoming obstacles.  Patients were asked to identify their biggest obstacle post d/c that stands in the way of their on-going success, and then problem solve as to how to manage this.  [name] picked up on the theme of AM group and again stated how he is a changed man.  As proof, he went over his past 11 years, 9 of which he says he has been in prison for things he did not do.  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  Since his latest jail time, he has turned over a new leaf.  He can't say that he will no longer smoke crack, but he did say that attending NA meetings are helpful, and he knows he will be better off if he does not get high.  [name], [name] 08/09/2013   3:00 PM

## 2013-08-09 NOTE — [facility] Suicide Risk Assessment (Signed)
[facility] INPATIENT:  Family/Significant Other Suicide Prevention Education  Suicide Prevention Education:  Patient Refusal for Family/Significant Other Suicide Prevention Education: The patient [name] has refused to provide written consent for family/significant other to be provided Family/Significant Other Suicide Prevention Education during admission and/or prior to discharge.  Physician notified.  Daryel Geraldorth, Artice Holohan B 08/09/2013, 11:45 AM

## 2013-08-10 LAB — GLUCOSE, CAPILLARY: GLUCOSE-CAPILLARY: 137 mg/dL — AB (ref 70–99)

## 2013-08-10 MED ORDER — LISINOPRIL 20 MG PO TABS: 20.0000 mg | ORAL_TABLET | Freq: Every day | ORAL | Status: DC

## 2013-08-10 MED ORDER — HYDROXYZINE HCL 50 MG PO TABS: 50.0000 mg | ORAL_TABLET | Freq: Every evening | ORAL | Status: DC | PRN

## 2013-08-10 MED ORDER — TRIHEXYPHENIDYL HCL 5 MG PO TABS: 5.0000 mg | ORAL_TABLET | Freq: Two times a day (BID) | ORAL | Status: DC

## 2013-08-10 MED ORDER — ARIPIPRAZOLE ER 400 MG IM SUSR: 400.0000 mg | INTRAMUSCULAR | Status: DC

## 2013-08-10 MED ORDER — GABAPENTIN 100 MG PO CAPS: 100.0000 mg | ORAL_CAPSULE | Freq: Two times a day (BID) | ORAL | Status: DC

## 2013-08-10 MED ORDER — ARIPIPRAZOLE 5 MG PO TABS: 5.0000 mg | ORAL_TABLET | Freq: Every day | ORAL | Status: DC

## 2013-08-10 MED ORDER — METFORMIN HCL 500 MG PO TABS: 500.0000 mg | ORAL_TABLET | Freq: Two times a day (BID) | ORAL | Status: DC

## 2013-08-10 NOTE — Progress Notes (Signed)
Discharge note: Pt received both written and verbal discharge instructions. Pt agreed to f/u appt and med regimen. Pt verbalized understanding up discharge instructions. Pt denies SI/HI/AVH at this time. Pt received all belongings,  Sample meds and prescriptions.

## 2013-08-10 NOTE — Discharge Summary (Signed)
Physician Discharge Summary Note  Patient:  [name] is an 53 y.o., male MRN:  [id number] DOB:  1960/12/16 Patient phone:  [phone] (home)  Patient address:   [address] [place] [address],  Total Time spent with patient: 20 minutes  Date of Admission:  08/04/2013 Date of Discharge: 08/10/12  Reason for Admission:  Psychosis with suicidal thoughts, Substance abuse   Discharge Diagnoses: Principal Problem:   Schizophrenia Active Problems:   Cocaine dependence   Psychiatric Specialty Exam: Physical Exam  Review of Systems  Constitutional: Negative.   HENT: Negative.   Eyes: Negative.   Respiratory: Negative.   Cardiovascular: Negative.   Gastrointestinal: Negative.   Genitourinary: Negative.   Musculoskeletal: Negative.   Skin: Negative.   Neurological: Negative.   Endo/Heme/Allergies: Negative.   Psychiatric/Behavioral: Negative for depression, suicidal ideas, hallucinations, memory loss and substance abuse. The patient is not nervous/anxious and does not have insomnia.     Blood pressure 165/88, pulse 97, temperature 98.5 F (36.9 C), temperature source Oral, resp. rate 18, weight 83.008 kg (183 lb).Body mass index is 27.01 kg/(m^2).  General Appearance: Fairly Groomed  Patent attorney::  Fair  Speech:  Clear and Coherent  Volume:  Normal  Mood:  Euthymic  Affect:  Appropriate  Thought Process:  Goal Directed  Orientation:  Full (Time, Place, and Person)  Thought Content:  Negative  Suicidal Thoughts:  No  Homicidal Thoughts:  No  Memory:  Immediate;   Fair Recent;   Fair Remote;   Fair  Judgement:  Fair  Insight:  Fair  Psychomotor Activity:  Normal  Concentration:  Fair  Recall:  [employer] of Knowledge:Fair  Language: Fair  Akathisia:  No  Handed:  Right  AIMS (if indicated):     Assets:  Communication Skills Social Support  Sleep:  Number of Hours: 4.5    Past Psychiatric History: Yes  Diagnosis: Schizophrenia    Hospitalizations: Several at [facility]   Outpatient Care: To follow up at [facility]   Substance Abuse Care: In the distant past, will go to [facility] after d/c   Self-Mutilation: Denies   Suicidal Attempts: Denies  Violent Behaviors: Denies    Musculoskeletal: Strength & Muscle Tone: within normal limits Gait & Station: normal Patient leans: N/A  DSM5: AXIS I: Schizophrenia relapsed  Cocaine dependence  AXIS II: Deferred  AXIS III:  Past Medical History   Diagnosis  Date   .  Diabetes mellitus without complication    .  Hypertension    AXIS IV: other psychosocial or environmental problems, problems related to legal system/crime and problems with primary support group  AXIS V: 61-70 mild symptoms   Level of Care:  OP  Hospital Course:  [name] is a 53 year old male who presented voluntarily to [facility] complaining of command auditory hallucinations with command to harm himself. The patient reported being recently released from jail after serving thirty days for trespassing. He has upcoming court dates for possession of cocaine. [name] started to use cocaine immediately after leaving jail and feels that he needs treatment for drug abuse or he will end up in jail for life. Patient usually gets Prolixin injections but has gotten off scheduled due to being in jail. The patient has also been noncompliant with his medication for diabetes. Patient is interested in going to rehab to get help with his substance abuse problems.   Patient was admitted to the 400 hall for medication management. He has a long history of medication  noncompliance. [name] reported that his previous medication Prolixin made him feel restless, anxious and fidgety.  Patient was started on Abilify 5 mg BID for psychosis with the plan of starting him on long acting injectable prior to discharge. The patient admitted that he had a hard time taking his medication on his own.  [name] was agreeable to this plan receiving  his first dose of Abilify Maintena 400 mg on 08/09/13. His medical problems of high blood pressure and diabetes were also addressed. He received Lisinopril 20 mg daily, Metformin 500 mg BID, Novolog SSI, and Novolog 6 units for meal coverage. After being started on Abilify he reported decreased paranoia, mood lability and auditory hallucinations. By the time of discharge his oral Abilify was decreased to 5 mg at bedtime. Patient is set up to follow up with [facility] for further substance abuse treatment on 08/17/13. He received prescriptions and medication samples prior to discharge. Patient denied suicidal thoughts or psychotic symptoms on the day of discharge.   Consults:  psychiatry  Significant Diagnostic Studies:  labs: Chem profile, CBC, Blood glucose monitoring  UDS positive for cocaine   Discharge Vitals:   Blood pressure 165/88, pulse 97, temperature 98.5 F (36.9 C), temperature source Oral, resp. rate 18, weight 83.008 kg (183 lb). Body mass index is 27.01 kg/(m^2). Lab Results:   Results for orders placed during the hospital encounter of 08/04/13 (from the past 72 hour(s))  GLUCOSE, CAPILLARY     Status: Abnormal   Collection Time    08/07/13 11:56 AM      Result Value Range   Glucose-Capillary 150 (*) 70 - 99 mg/dL  GLUCOSE, CAPILLARY     Status: Abnormal   Collection Time    08/07/13  4:40 PM      Result Value Range   Glucose-Capillary 168 (*) 70 - 99 mg/dL   Comment 1 Documented in Chart     Comment 2 Notify RN    GLUCOSE, CAPILLARY     Status: Abnormal   Collection Time    08/07/13  9:21 PM      Result Value Range   Glucose-Capillary 112 (*) 70 - 99 mg/dL  GLUCOSE, CAPILLARY     Status: Abnormal   Collection Time    08/08/13  6:13 AM      Result Value Range   Glucose-Capillary 124 (*) 70 - 99 mg/dL  GLUCOSE, CAPILLARY     Status: Abnormal   Collection Time    08/08/13 11:33 AM      Result Value Range   Glucose-Capillary 134 (*) 70 - 99 mg/dL  GLUCOSE, CAPILLARY      Status: Abnormal   Collection Time    08/08/13  5:09 PM      Result Value Range   Glucose-Capillary 120 (*) 70 - 99 mg/dL   Comment 1 Documented in Chart     Comment 2 Notify RN    GLUCOSE, CAPILLARY     Status: Abnormal   Collection Time    08/08/13  9:11 PM      Result Value Range   Glucose-Capillary 150 (*) 70 - 99 mg/dL  GLUCOSE, CAPILLARY     Status: Abnormal   Collection Time    08/09/13  6:27 AM      Result Value Range   Glucose-Capillary 120 (*) 70 - 99 mg/dL  GLUCOSE, CAPILLARY     Status: Abnormal   Collection Time    08/09/13 11:58 AM      Result Value Range  Glucose-Capillary 112 (*) 70 - 99 mg/dL  GLUCOSE, CAPILLARY     Status: Abnormal   Collection Time    08/09/13  4:55 PM      Result Value Range   Glucose-Capillary 123 (*) 70 - 99 mg/dL   Comment 1 Documented in Chart     Comment 2 Notify RN    GLUCOSE, CAPILLARY     Status: Abnormal   Collection Time    08/09/13 10:23 PM      Result Value Range   Glucose-Capillary 140 (*) 70 - 99 mg/dL   Comment 1 Notify RN    GLUCOSE, CAPILLARY     Status: Abnormal   Collection Time    08/10/13  6:16 AM      Result Value Range   Glucose-Capillary 137 (*) 70 - 99 mg/dL   Comment 1 Notify RN      Physical Findings: AIMS: Facial and Oral Movements Muscles of Facial Expression: None, normal Lips and Perioral Area: None, normal Jaw: None, normal Tongue: None, normal,Extremity Movements Upper (arms, wrists, hands, fingers): None, normal Lower (legs, knees, ankles, toes): None, normal, Trunk Movements Neck, shoulders, hips: None, normal, Overall Severity Severity of abnormal movements (highest score from questions above): None, normal Incapacitation due to abnormal movements: None, normal Patient's awareness of abnormal movements (rate only patient's report): No Awareness, Dental Status Current problems with teeth and/or dentures?: No Does patient usually wear dentures?: No  CIWA:  CIWA-Ar Total: 1 COWS:  COWS  Total Score: 1  Psychiatric Specialty Exam: See Psychiatric Specialty Exam and Suicide Risk Assessment completed by Attending Physician prior to discharge.  Discharge destination:  Home  Is patient on multiple antipsychotic therapies at discharge:  No   Has Patient had three or more failed trials of antipsychotic monotherapy by history:  No  Recommended Plan for Multiple Antipsychotic Therapies: NA  Discharge Orders   Future Orders Complete By Expires   Diet - low sodium heart healthy  As directed    Discharge instructions  As directed    Comments:     Please call to make a follow up appointment for your medical needs at [facility] at [address] [place], [place] [address] with main number [phone].       Medication List    STOP taking these medications       cloNIDine 0.2 MG tablet  Commonly known as:  CATAPRES     fluPHENAZine 5 MG tablet  Commonly known as:  PROLIXIN      TAKE these medications     Indication   acetaminophen 500 MG tablet  Commonly known as:  TYLENOL  Take 1,000 mg by mouth every 6 (six) hours as needed for moderate pain.      ARIPiprazole 5 MG tablet  Commonly known as:  ABILIFY  Take 1 tablet (5 mg total) by mouth at bedtime. For mood stability   Indication:  Schizophrenia     ARIPiprazole 400 MG Susr  Commonly known as:  ABILIFY MAINTENA  Inject 400 mg into the muscle every 28 (twenty-eight) days. First dose received on 08/09/13 to be due again in twenty-eight days.   Indication:  Schizophrenia     gabapentin 100 MG capsule  Commonly known as:  NEURONTIN  Take 1 capsule (100 mg total) by mouth 2 (two) times daily.   Indication:  Cocaine Dependence, Neuropathic pain     hydrOXYzine 50 MG tablet  Commonly known as:  ATARAX/VISTARIL  Take 1 tablet (50 mg total) by mouth at bedtime as needed and may repeat dose one time if needed (insomnia).      lisinopril 20 MG tablet  Commonly known as:   PRINIVIL,ZESTRIL  Take 1 tablet (20 mg total) by mouth daily. For high blood pressure.   Indication:  High Blood Pressure     metFORMIN 500 MG tablet  Commonly known as:  GLUCOPHAGE  Take 1 tablet (500 mg total) by mouth 2 (two) times daily with a meal.   Indication:  Type 2 Diabetes     trihexyphenidyl 5 MG tablet  Commonly known as:  ARTANE  Take 1 tablet (5 mg total) by mouth 2 (two) times daily with a meal.   Indication:  Extrapyramidal Reaction caused by Medications           Follow-up Information   Follow up with [name]. (Go to the walk-in clinic M-F between 8 and 9AM for your hospital follow up appointment)    Contact information:   [address]  [place]  [phone]      Follow up with [facility]  rehab On 08/17/2013. (Arrive at 8AM sharp on Tuesday for your screening for admission appointment.  You will need to go with at least 14 day supply of medication.  You will need to either fill your prescription on your own, or go to [facility] before you go to your Tuesday appoi)    Contact information:   [address]  [phone]      Follow-up recommendations:   Activity: as tolerated  Diet: healthy  Tests: routine  Other: patient to keep his after care appointment   Comments:   Take all your medications as prescribed by your mental healthcare provider.  Report any adverse effects and or reactions from your medicines to your outpatient provider promptly.  Patient is instructed and cautioned to not engage in alcohol and or illegal drug use while on prescription medicines.  In the event of worsening symptoms, patient is instructed to call the crisis hotline, 911 and or go to the nearest ED for appropriate evaluation and treatment of symptoms.  Follow-up with your primary care provider for your other medical issues, concerns and or health care needs.   Total Discharge Time:  Greater than 30 minutes.  SignedFransisca Kaufmann NP-C 08/10/2013, 9:24 AM   Patient seen, evaluated and I agree with notes by Nurse Practitioner. Thedore Mins, MD

## 2013-08-10 NOTE — [facility] Group Notes (Signed)
[facility] Group Notes:  (Nursing/MHT/Case Management/Adjunct)  Date:  08/10/2013  Time:  0900 am  Type of Therapy:  Nurse Education  Participation Level:  Active  Participation Quality:  Appropriate  Affect:  Appropriate  Cognitive:  Appropriate  Insight:  Appropriate  Engagement in Group:  Engaged  Modes of Intervention:  Education  Summary of Progress/Problems: Pt cooperative during group.  Jernee Murtaugh L 08/10/2013, 9:36 AM

## 2013-08-10 NOTE — Progress Notes (Signed)
D  Pt reports he is looking forward to being discharged tomorrow  [place] is [place]   He interacts appropriately with others and is active in groups   He expresses being hopeful he will not use drugs this time and he said it is time for him to quit using the system A   Verbal support given   Medications administered and effectiveness monitored   Q 15 min checks R   Pt safe at present

## 2013-08-10 NOTE — Progress Notes (Signed)
[facility] :  Will you be returning to the same living situation after discharge: Yes,  home At discharge, do you have transportation home?:Yes,  cousin Do you have the ability to pay for your medications:Yes,  [facility]  Release of information consent forms completed and in the chart;  Patient's signature needed at discharge.  Patient to Follow up at: Follow-up Information   Follow up with [name]. (Go to the walk-in clinic M-F between 8 and 9AM for your hospital follow up appointment)    Contact information:   [address]  [place]  [phone]      Follow up with [facility]  rehab On 08/17/2013. (Arrive at 8AM sharp on Tuesday for your screening for admission appointment.  You will need to go with at least 14 day supply of medication.  You will need to either fill your prescription on your own, or [facility] before you go to your Tuesday appoi)    Contact information:   [address]  [facility]  [phone]      Patient denies SI/HI:   Yes,  yes    Safety Planning and Suicide Prevention discussed:  Yes,  yes'  Ida Rogueorth, Georges Victorio B 08/10/2013, 9:08 AM

## 2013-08-10 NOTE — [facility] Suicide Risk Assessment (Signed)
   Demographic Factors:  Male, Low socioeconomic status, Unemployed and African American  Total Time spent with patient: 15 minutes  Psychiatric Specialty Exam: Physical Exam  Psychiatric: He has a normal mood and affect. His speech is normal and behavior is normal. Thought content normal. Cognition and memory are normal. He expresses impulsivity.    Review of Systems  Constitutional: Negative.   HENT: Negative.   Eyes: Negative.   Respiratory: Negative.   Cardiovascular: Negative.   Gastrointestinal: Negative.   Genitourinary: Negative.   Musculoskeletal: Negative.   Skin: Negative.   Neurological: Negative.   Endo/Heme/Allergies: Negative.   Psychiatric/Behavioral: Negative.     Blood pressure 165/88, pulse 97, temperature 98.5 F (36.9 C), temperature source Oral, resp. rate 18, weight 83.008 kg (183 lb).Body mass index is 27.01 kg/(m^2).  General Appearance: Fairly Groomed  Patent attorneyye Contact::  Fair  Speech:  Clear and Coherent  Volume:  Normal  Mood:  Euthymic  Affect:  Appropriate  Thought Process:  Goal Directed  Orientation:  Full (Time, Place, and Person)  Thought Content:  Negative  Suicidal Thoughts:  No  Homicidal Thoughts:  No  Memory:  Immediate;   Fair Recent;   Fair Remote;   Fair  Judgement:  Fair  Insight:  Fair  Psychomotor Activity:  Normal  Concentration:  Fair  Recall:  [employer]  Fund of Knowledge:Fair  Language: Fair  Akathisia:  No  Handed:  Right  AIMS (if indicated):     Assets:  Communication Skills Social Support  Sleep:  Number of Hours: 4.5    Musculoskeletal: Strength & Muscle Tone: within normal limits Gait & Station: normal Patient leans: N/A   Mental Status Per Nursing Assessment::   On Admission:  NA  Current Mental Status by Physician: Alert and Oriented to time, place and person  Loss Factors: Financial problems/change in socioeconomic status  Historical Factors: Impulsivity  Risk Reduction Factors:   Positive coping  skills or problem solving skills  Continued Clinical Symptoms:  Alcohol/Substance Abuse/Dependencies  Cognitive Features That Contribute To Risk:  Polarized thinking    Suicide Risk:  Minimal: No identifiable suicidal ideation.  Patients presenting with no risk factors but with morbid ruminations; may be classified as minimal risk based on the severity of the depressive symptoms  Discharge Diagnoses:   AXIS I:  Schizophrenia relapsed              Cocaine dependence  AXIS II:  Deferred AXIS III:   Past Medical History  Diagnosis Date  . Diabetes mellitus without complication   . Hypertension    AXIS IV:  other psychosocial or environmental problems, problems related to legal system/crime and problems with primary support group AXIS V:  61-70 mild symptoms  Plan Of Care/Follow-up recommendations:  Activity:  as tolerated Diet:  healthy Tests:  routine Other:  patient to keep his after care appointment  Is patient on multiple antipsychotic therapies at discharge:  No   Has Patient had three or more failed trials of antipsychotic monotherapy by history:  No  Recommended Plan for Multiple Antipsychotic Therapies: NA    Thedore MinsAkintayo, Ameriah Lint, MD 08/10/2013, 9:14 AM

## 2013-08-12 ENCOUNTER — Emergency Department (HOSPITAL_COMMUNITY)
Admission: EM | Admit: 2013-08-12 | Discharge: 2013-08-12 | Disposition: A | Discharge status: Home / Self Care | Payer: Medicare Other | Attending: Emergency Medicine | Admitting: Emergency Medicine

## 2013-08-12 ENCOUNTER — Encounter (HOSPITAL_COMMUNITY): Payer: Self-pay | Admitting: Emergency Medicine

## 2013-08-12 DIAGNOSIS — F209 Schizophrenia, unspecified: Secondary | ICD-10-CM

## 2013-08-12 DIAGNOSIS — F1910 Other psychoactive substance abuse, uncomplicated: Secondary | ICD-10-CM

## 2013-08-12 DIAGNOSIS — I10 Essential (primary) hypertension: Secondary | ICD-10-CM | POA: Insufficient documentation

## 2013-08-12 DIAGNOSIS — D649 Anemia, unspecified: Secondary | ICD-10-CM

## 2013-08-12 DIAGNOSIS — F1420 Cocaine dependence, uncomplicated: Secondary | ICD-10-CM | POA: Insufficient documentation

## 2013-08-12 DIAGNOSIS — I509 Heart failure, unspecified: Secondary | ICD-10-CM | POA: Insufficient documentation

## 2013-08-12 DIAGNOSIS — Z79899 Other long term (current) drug therapy: Secondary | ICD-10-CM | POA: Insufficient documentation

## 2013-08-12 DIAGNOSIS — F17200 Nicotine dependence, unspecified, uncomplicated: Secondary | ICD-10-CM | POA: Insufficient documentation

## 2013-08-12 DIAGNOSIS — E119 Type 2 diabetes mellitus without complications: Secondary | ICD-10-CM | POA: Insufficient documentation

## 2013-08-12 LAB — CBC WITH DIFFERENTIAL/PLATELET
BASOS ABS: 0 10*3/uL (ref 0.0–0.1)
BASOS PCT: 0 % (ref 0–1)
EOS ABS: 0 10*3/uL (ref 0.0–0.7)
Eosinophils Relative: 0 % (ref 0–5)
HCT: 30.9 % — ABNORMAL LOW (ref 39.0–52.0)
HEMOGLOBIN: 10.2 g/dL — AB (ref 13.0–17.0)
Lymphocytes Relative: 12 % (ref 12–46)
Lymphs Abs: 1.3 10*3/uL (ref 0.7–4.0)
MCH: 29.7 pg (ref 26.0–34.0)
MCHC: 33 g/dL (ref 30.0–36.0)
MCV: 89.8 fL (ref 78.0–100.0)
MONOS PCT: 7 % (ref 3–12)
Monocytes Absolute: 0.8 10*3/uL (ref 0.1–1.0)
NEUTROS PCT: 80 % — AB (ref 43–77)
Neutro Abs: 8.5 10*3/uL — ABNORMAL HIGH (ref 1.7–7.7)
Platelets: 228 10*3/uL (ref 150–400)
RBC: 3.44 MIL/uL — ABNORMAL LOW (ref 4.22–5.81)
RDW: 15.1 % (ref 11.5–15.5)
WBC: 10.6 10*3/uL — ABNORMAL HIGH (ref 4.0–10.5)

## 2013-08-12 LAB — GLUCOSE, CAPILLARY: Glucose-Capillary: 151 mg/dL — ABNORMAL HIGH (ref 70–99)

## 2013-08-12 LAB — COMPREHENSIVE METABOLIC PANEL
ALK PHOS: 80 U/L (ref 39–117)
ALT: 18 U/L (ref 0–53)
AST: 29 U/L (ref 0–37)
Albumin: 3.9 g/dL (ref 3.5–5.2)
BUN: 14 mg/dL (ref 6–23)
CO2: 27 mEq/L (ref 19–32)
Calcium: 9.6 mg/dL (ref 8.4–10.5)
Chloride: 104 mEq/L (ref 96–112)
Creatinine, Ser: 0.79 mg/dL (ref 0.50–1.35)
GFR calc Af Amer: >90 mL/min (ref >90)
GFR calc non Af Amer: >90 mL/min (ref >90)
Glucose, Bld: 137 mg/dL — ABNORMAL HIGH (ref 70–99)
Potassium: 4 mEq/L (ref 3.7–5.3)
Sodium: 143 mEq/L (ref 137–147)
TOTAL PROTEIN: 7.3 g/dL (ref 6.0–8.3)
Total Bilirubin: 0.7 mg/dL (ref 0.3–1.2)

## 2013-08-12 LAB — URINALYSIS, ROUTINE W REFLEX MICROSCOPIC
Bilirubin Urine: NEGATIVE
GLUCOSE, UA: 100 mg/dL — AB
Hgb urine dipstick: NEGATIVE
KETONES UR: NEGATIVE mg/dL
LEUKOCYTES UA: NEGATIVE
Nitrite: NEGATIVE
Protein, ur: 30 mg/dL — AB
SPECIFIC GRAVITY, URINE: 1.02 (ref 1.005–1.030)
Urobilinogen, UA: 1 mg/dL (ref 0.0–1.0)
pH: 7.5 (ref 5.0–8.0)

## 2013-08-12 LAB — RAPID URINE DRUG SCREEN, HOSP PERFORMED
AMPHETAMINES: NOT DETECTED
BENZODIAZEPINES: NOT DETECTED
Barbiturates: NOT DETECTED
COCAINE: POSITIVE — AB
Opiates: NOT DETECTED
Tetrahydrocannabinol: NOT DETECTED

## 2013-08-12 LAB — ETHANOL: Alcohol, Ethyl (B): <11 mg/dL (ref 0–11)

## 2013-08-12 LAB — URINE MICROSCOPIC-ADD ON

## 2013-08-12 LAB — LIPASE, BLOOD: LIPASE: 14 U/L (ref 11–59)

## 2013-08-12 MED ORDER — LISINOPRIL 20 MG PO TABS
20.0000 mg | ORAL_TABLET | Freq: Once | ORAL | Status: AC
  Administered 2013-08-12: 20 mg via ORAL
  Filled 2013-08-12: qty 1

## 2013-08-12 MED ORDER — ARIPIPRAZOLE 10 MG PO TABS
10.0000 mg | ORAL_TABLET | Freq: Every day | ORAL | Status: DC
Start: 2013-08-12 — End: 2013-08-12

## 2013-08-12 MED ORDER — TRIHEXYPHENIDYL HCL 5 MG PO TABS
5.0000 mg | ORAL_TABLET | Freq: Two times a day (BID) | ORAL | Status: DC
  Administered 2013-08-12: 5 mg via ORAL
  Filled 2013-08-12 (×3): qty 1

## 2013-08-12 MED ORDER — ONDANSETRON HCL 4 MG PO TABS: 4.0000 mg | ORAL_TABLET | Freq: Three times a day (TID) | ORAL | Status: DC | PRN

## 2013-08-12 MED ORDER — ACETAMINOPHEN 325 MG PO TABS
650.0000 mg | ORAL_TABLET | ORAL | Status: DC | PRN
  Administered 2013-08-12: 650 mg via ORAL
  Filled 2013-08-12: qty 2

## 2013-08-12 MED ORDER — IBUPROFEN 400 MG PO TABS: 600.0000 mg | ORAL_TABLET | Freq: Three times a day (TID) | ORAL | Status: DC | PRN

## 2013-08-12 MED ORDER — ARIPIPRAZOLE 10 MG PO TABS: 10.0000 mg | ORAL_TABLET | Freq: Every day | ORAL | Status: DC

## 2013-08-12 MED ORDER — ARIPIPRAZOLE 10 MG PO TABS
10.0000 mg | ORAL_TABLET | Freq: Every day | ORAL | Status: DC
  Filled 2013-08-12: qty 1

## 2013-08-12 MED ORDER — METFORMIN HCL 500 MG PO TABS
500.0000 mg | ORAL_TABLET | Freq: Two times a day (BID) | ORAL | Status: DC
  Administered 2013-08-12: 500 mg via ORAL
  Filled 2013-08-12: qty 1

## 2013-08-12 MED ORDER — ALUM & MAG HYDROXIDE-SIMETH 200-200-20 MG/5ML PO SUSP: 30.0000 mL | ORAL | Status: DC | PRN

## 2013-08-12 MED ORDER — ZOLPIDEM TARTRATE 5 MG PO TABS: 5.0000 mg | ORAL_TABLET | Freq: Every evening | ORAL | Status: DC | PRN

## 2013-08-12 MED ORDER — LISINOPRIL 20 MG PO TABS
20.0000 mg | ORAL_TABLET | Freq: Every day | ORAL | Status: DC
  Administered 2013-08-12: 20 mg via ORAL
  Filled 2013-08-12: qty 1

## 2013-08-12 MED ORDER — LORAZEPAM 1 MG PO TABS: 1.0000 mg | ORAL_TABLET | Freq: Three times a day (TID) | ORAL | Status: DC | PRN

## 2013-08-12 MED ORDER — GABAPENTIN 100 MG PO CAPS
100.0000 mg | ORAL_CAPSULE | Freq: Two times a day (BID) | ORAL | Status: DC
  Administered 2013-08-12: 100 mg via ORAL
  Filled 2013-08-12 (×2): qty 1

## 2013-08-12 MED ORDER — ARIPIPRAZOLE 5 MG PO TABS
5.0000 mg | ORAL_TABLET | Freq: Every day | ORAL | Status: DC
  Filled 2013-08-12: qty 1

## 2013-08-12 NOTE — Discharge Instructions (Signed)
Drug Abuse and Addiction in Sports There are many types of drugs that one may become addicted to including illegal drugs (marijuana, cocaine, amphetamines, hallucinogens, and narcotics), prescription drugs (hydrocodone, codeine, and alprazolam), and other chemicals such as alcohol or nicotine. Two types of addiction exist: physical and emotional. Physical addiction usually occurs after prolonged use of a drug. However, some drugs may only take a couple uses before addiction can occur. Physical addiction is marked by withdrawal symptoms, in which the person experiences negative symptoms such as sweat, anxiety, tremors, hallucinations, or cravings in the absence of using the drug. Emotional dependence is the psychological desire for the "high" that the drugs produce when taken. SYMPTOMS   Inattentiveness.  Negligence.  Forgetfulness.  Insomnia.  Mood swings. RISK INCREASES WITH:   Family history of addiction.  Personal history of addictive personality. Studies have shown that risktakers, which many athletes are, have a higher risk of addiction. PREVENTION The only adequate prevention of drug abuse is abstinence from drugs. TREATMENT  The first step in quitting substance abuse is recognizing the problem and realizing that one has the power to change. Quitting requires a plan and support from others. It is often necessary to seek medical assistance. Caregivers are available to offer counseling, and for certain cases, medicine to diminish the physical symptoms of withdrawal. Many organizations exist such as Alcoholics Anonymous, Narcotics Anonymous, or the [employer] on Alcoholism that offer support for individuals who have chosen to quit their habits. Document Released: 06/17/2005 Document Revised: 09/09/2011 Document Reviewed: 09/29/2008 [facility] Patient Information 2014 [place], [place].  Schizophrenia Schizophrenia is a mental illness. It may cause disturbed or disorganized thinking,  speech, or behavior. People with schizophrenia have problems functioning in one or more areas of life: work, school, home, or relationships. People with schizophrenia are at increased risk for suicide, certain chronic physical illnesses, and unhealthy behaviors, such as smoking and drug use. People who have family members with schizophrenia are at higher risk of developing the illness. Schizophrenia affects men and women equally but usually appears at an earlier age (teenage or early adult years) in men.  SYMPTOMS The earliest symptoms are often subtle (prodrome) and may go unnoticed until the illness becomes more severe (first-break psychosis). Symptoms of schizophrenia may be continuous or may come and go in severity. Episodes often are triggered by major life events, such as family stress, college, [employer], marriage, pregnancy or child birth, divorce, or loss of a loved one. People with schizophrenia may see, hear, or feel things that do not exist (hallucinations). They may have false beliefs in spite of obvious proof to the contrary (delusions). Sometimes speech is incoherent or behavior is odd or withdrawn.  DIAGNOSIS Schizophrenia is diagnosed through an assessment by your caregiver. Your caregiver will ask questions about your thoughts, behavior, mood, and ability to function in daily life. Your caregiver may ask questions about your medical history and use of alcohol or drugs, including prescription medication. Your caregiver may also order blood tests and imaging exams. Certain medical conditions and substances can cause symptoms that resemble schizophrenia. Your caregiver may refer you to a mental health specialist for evaluation. There are three major criterion for a diagnosis of schizophrenia:  Two or more of the following five symptoms are present for a month or longer:  Delusions. Often the delusions are that you are being attacked, harassed, cheated, persecuted or conspired against  (persecutory delusions).  Hallucinations.   Disorganized speech that does not make sense to others.  Grossly disorganized (confused or unfocused) behavior or extremely overactive or underactive motor activity (catatonia).  Negative symptoms such as bland or blunted emotions (flat affect), loss of will power (avolition), and withdrawal from social contacts (social isolation).  Level of functioning in one or more major areas of life (work, school, relationships, or self-care) is markedly below the level of functioning before the onset of illness.   There are continuous signs of illness (either mild symptoms or decreased level of functioning) for at least 6 months or longer. TREATMENT  Schizophrenia is a long-term illness. It is best controlled with continuous treatment rather than treatment only when symptoms occur. The following treatments are used to manage schizophrenia:  Medication Medication is the most effective and important form of treatment for schizophrenia. Antipsychotic medications are usually prescribed to help manage schizophrenia. Other types of medication may be added to relieve any symptoms that may occur despite the use of antipsychotic medications.  Counseling or talk therapy Individual, group, or family counseling may be helpful in providing education, support, and guidance. Many people with schizophrenia also benefit from social skills and job skills (vocational) training. A combination of medication and counseling is best for managing the disorder over time. A procedure in which electricity is applied to the brain through the scalp (electroconvulsive therapy) may be used to treat catatonic schizophrenia or schizophrenia in people who cannot take or do not respond to medication and counseling. Document Released: 06/14/2000 Document Revised: 02/17/2013 Document Reviewed: 09/09/2012 [facility] Patient Information 2014 [place].

## 2013-08-12 NOTE — ED Provider Notes (Signed)
Care assumed from Dr. Preston FleetingGlick pending psychiatry eval.  VSS.  Benign exam.  Pt hearing voices, but not telling him to hurt himself or others.    9:52 AM [facility] [employer] Whithrow.  Pt is cleared for d/c to home.  Will modify medications based on psych recommendations. Increase Abilty to 10 mg at night.    ER precautions given.  Patient requesting a note for court date tomorrow. This will not be provided however he will receive a note stating that he was in the emergency department today.    Darlys Galesavid Masneri, MD 08/12/13 1047

## 2013-08-12 NOTE — ED Notes (Signed)
At time of D/C Pt refused RX for Abilify because he never gets them filled.

## 2013-08-12 NOTE — ED Notes (Signed)
Pt. wearing paper scrubs , personal belongings placed in a plastic bag/labelled . Security notified to wand pt.

## 2013-08-12 NOTE — Consult Note (Signed)
Telepsych Consultation   Reason for Consult:  Auditory Hallucinations Referring Physician:  EDP DAIRON PROCTER is an 53 y.o. male.  Assessment: AXIS I:  Substance Abuse and Substance Induced Mood Disorder, Schizophrenia AXIS II:  Deferred AXIS III:   Past Medical History  Diagnosis Date  . Diabetes mellitus without complication   . Hypertension   . Schizophrenia   . CHF (congestive heart failure)    AXIS IV:  economic problems, educational problems, housing problems, occupational problems, other psychosocial or environmental problems, problems related to legal system/crime, problems related to social environment, problems with access to health care services and problems with primary support group AXIS V:  61-70 mild symptoms  Plan:  No evidence of imminent risk to self or others at present.   Patient does not meet criteria for psychiatric inpatient admission. Supportive therapy provided about ongoing stressors. Refer to IOP. Discussed crisis plan, support from social network, calling 911, coming to the Emergency Department, and calling Suicide Hotline.  Subjective:   [name] is a 53 y.o. male patient presenting to the [facility] initially with complaints of feeling unstable in regard to his head and blood pressure, reporting that he wanted to get checked out for hypertension and diabetes. Per pt report, pt later decided to inform the ED staff that he was hearing voices. During the telepsych assessment, pt reports that he hears voices recommending that he "wash his hands", but nothing else but whispers in a male voice. He denies SI, HI, and VH, but does mention that he occasionally feels as if police are watching him from [place]. Pt states he has a home on [address] [place] and resources for basic needs. During this assessment, pt kept interrupting this NP to ask if we could "call his lawyer, Jac Canavan" and that he "really needs a note for court" so he can show his lawyer since  is "court date is 2/13, tomorrow". Pt appears to be fixated on the court date and lawyer. When pt was informed that he does not meet inpatient criteria, he stated "I'm fine leaving here and going home, my only thing is I just need a note saying I was at the hospital today." Pt was informed that we do not get involved in legal matters, but that for any medical visit, he may receive a note for that, unrelated to his legal situation, and that if he wants to take that to his lawyer, it is his decision. Pt was informed that we do not call lawyers and that is not an issue we get involved in.   Pt was discharged from [facility] on [facility]/10 and has been here for 14 admissions in the past few years. Pt has a significant mental health history as well as multiple incarcerations known to this provider. Pt has a followup appointment next week with [facility] to manage his current medications, which he was sent home on with a 14-day supply. Pt agreed to meet his follow-up appointment to get refills and medication titration as well as therapy and substance abuse help.  HPI:  Pt has a longstanding history of psychiatric inpatient hospitalizations, including a recent discharge from [facility] on 08/10/13. Pt has been in the mental health system for 29 yrs and often presents to the emergency department with the same complaints each time. Pt also has a known history of homelessness and there is a correlation between ED visits and inpatient stays with periods of time when pt states he is homeless. Pt has a  known history of Schizophrenia and has been receiving intermittent treatment for such.   HPI Elements:   Location:  Inpatient, [facility]. Quality:  Stable. Severity:  Mild/Moderate. Timing:  Constant. Duration: Chronic x 29 yrs Context:  Pt has a court day tomorrow and is fixated on a note for this..  Past Psychiatric History: Past Medical History  Diagnosis Date  . Diabetes mellitus without complication   . Hypertension   . Schizophrenia    . CHF (congestive heart failure)     reports that he has been smoking Cigarettes.  He has been smoking about 2.00 packs per day. He uses smokeless tobacco. He reports that he uses illicit drugs ("Crack" cocaine and Cocaine) about 7 times per week. He reports that he does not drink alcohol. History reviewed. No pertinent family history.       Allergies:   Allergies  Allergen Reactions  . Haldol [Haloperidol] Other (See Comments)    Muscle spasms, loss of voluntary movement    ACT Assessment Complete:  Yes:    Educational Status    Risk to Self: Risk to self Is patient at risk for suicide?: No Substance abuse history and/or treatment for substance abuse?: Yes (smoked crack cocaine three hours ago)  Risk to Others:    Abuse:    Prior Inpatient Therapy:    Prior Outpatient Therapy:    Additional Information:                    Objective: Blood pressure 140/73, pulse 94, temperature 99.2 F (37.3 C), temperature source Oral, resp. rate 14, SpO2 94.00%.There is no weight on file to calculate BMI. Results for orders placed during the hospital encounter of 08/12/13 (from the past 72 hour(s))  GLUCOSE, CAPILLARY     Status: Abnormal   Collection Time    08/12/13  4:03 AM      Result Value Ref Range   Glucose-Capillary 151 (*) 70 - 99 mg/dL  CBC WITH DIFFERENTIAL     Status: Abnormal   Collection Time    08/12/13  4:15 AM      Result Value Ref Range   WBC 10.6 (*) 4.0 - 10.5 K/uL   RBC 3.44 (*) 4.22 - 5.81 MIL/uL   Hemoglobin 10.2 (*) 13.0 - 17.0 g/dL   HCT 30.9 (*) 39.0 - 52.0 %   MCV 89.8  78.0 - 100.0 fL   MCH 29.7  26.0 - 34.0 pg   MCHC 33.0  30.0 - 36.0 g/dL   RDW 15.1  11.5 - 15.5 %   Platelets 228  150 - 400 K/uL   Neutrophils Relative % 80 (*) 43 - 77 %   Neutro Abs 8.5 (*) 1.7 - 7.7 K/uL   Lymphocytes Relative 12  12 - 46 %   Lymphs Abs 1.3  0.7 - 4.0 K/uL   Monocytes Relative 7  3 - 12 %   Monocytes Absolute 0.8  0.1 - 1.0 K/uL   Eosinophils  Relative 0  0 - 5 %   Eosinophils Absolute 0.0  0.0 - 0.7 K/uL   Basophils Relative 0  0 - 1 %   Basophils Absolute 0.0  0.0 - 0.1 K/uL  COMPREHENSIVE METABOLIC PANEL     Status: Abnormal   Collection Time    08/12/13  4:15 AM      Result Value Ref Range   Sodium 143  137 - 147 mEq/L   Potassium 4.0  3.7 - 5.3 mEq/L   Chloride  104  96 - 112 mEq/L   CO2 27  19 - 32 mEq/L   Glucose, Bld 137 (*) 70 - 99 mg/dL   BUN 14  6 - 23 mg/dL   Creatinine, Ser 0.79  0.50 - 1.35 mg/dL   Calcium 9.6  8.4 - 10.5 mg/dL   Total Protein 7.3  6.0 - 8.3 g/dL   Albumin 3.9  3.5 - 5.2 g/dL   AST 29  0 - 37 U/L   ALT 18  0 - 53 U/L   Alkaline Phosphatase 80  39 - 117 U/L   Total Bilirubin 0.7  0.3 - 1.2 mg/dL   GFR calc non Af Amer >90  >90 mL/min   GFR calc Af Amer >90  >90 mL/min   Comment: (NOTE)     The eGFR has been calculated using the CKD EPI equation.     This calculation has not been validated in all clinical situations.     eGFR's persistently <90 mL/min signify possible Chronic Kidney     Disease.  LIPASE, BLOOD     Status: None   Collection Time    08/12/13  4:15 AM      Result Value Ref Range   Lipase 14  11 - 59 U/L  URINALYSIS, ROUTINE W REFLEX MICROSCOPIC     Status: Abnormal   Collection Time    08/12/13  4:15 AM      Result Value Ref Range   Color, Urine YELLOW  YELLOW   APPearance CLEAR  CLEAR   Specific Gravity, Urine 1.020  1.005 - 1.030   pH 7.5  5.0 - 8.0   Glucose, UA 100 (*) NEGATIVE mg/dL   Hgb urine dipstick NEGATIVE  NEGATIVE   Bilirubin Urine NEGATIVE  NEGATIVE   Ketones, ur NEGATIVE  NEGATIVE mg/dL   Protein, ur 30 (*) NEGATIVE mg/dL   Urobilinogen, UA 1.0  0.0 - 1.0 mg/dL   Nitrite NEGATIVE  NEGATIVE   Leukocytes, UA NEGATIVE  NEGATIVE  URINE RAPID DRUG SCREEN (HOSP PERFORMED)     Status: Abnormal   Collection Time    08/12/13  4:15 AM      Result Value Ref Range   Opiates NONE DETECTED  NONE DETECTED   Cocaine POSITIVE (*) NONE DETECTED   Benzodiazepines  NONE DETECTED  NONE DETECTED   Amphetamines NONE DETECTED  NONE DETECTED   Tetrahydrocannabinol NONE DETECTED  NONE DETECTED   Barbiturates NONE DETECTED  NONE DETECTED   Comment:            DRUG SCREEN FOR MEDICAL PURPOSES     ONLY.  IF CONFIRMATION IS NEEDED     FOR ANY PURPOSE, NOTIFY LAB     WITHIN 5 DAYS.                LOWEST DETECTABLE LIMITS     FOR URINE DRUG SCREEN     Drug Class       Cutoff (ng/mL)     Amphetamine      1000     Barbiturate      200     Benzodiazepine   759     Tricyclics       163     Opiates          300     Cocaine          300     THC              50  ETHANOL     Status: None   Collection Time    08/12/13  4:15 AM      Result Value Ref Range   Alcohol, Ethyl (B) <11  0 - 11 mg/dL   Comment:            LOWEST DETECTABLE LIMIT FOR     SERUM ALCOHOL IS 11 mg/dL     FOR MEDICAL PURPOSES ONLY  URINE MICROSCOPIC-ADD ON     Status: None   Collection Time    08/12/13  4:15 AM      Result Value Ref Range   WBC, UA 3-6  <3 WBC/hpf   RBC / HPF 0-2  <3 RBC/hpf   Bacteria, UA RARE  RARE   Labs are reviewed and are pertinent for Glucose 137, (+) cocaine.   Current Facility-Administered Medications  Medication Dose Route Frequency Provider Last Rate Last Dose  . acetaminophen (TYLENOL) tablet 650 mg  650 mg Oral X8P PRN Delora Fuel, MD   382 mg at 08/12/13 0715  . alum & mag hydroxide-simeth (MAALOX/MYLANTA) 200-200-20 MG/5ML suspension 30 mL  30 mL Oral PRN Delora Fuel, MD      . ARIPiprazole (ABILIFY) tablet 5 mg  5 mg Oral QHS Delora Fuel, MD      . gabapentin (NEURONTIN) capsule 100 mg  100 mg Oral BID Delora Fuel, MD      . ibuprofen (ADVIL,MOTRIN) tablet 600 mg  600 mg Oral N0N PRN Delora Fuel, MD      . lisinopril (PRINIVIL,ZESTRIL) tablet 20 mg  20 mg Oral Daily Delora Fuel, MD      . LORazepam (ATIVAN) tablet 1 mg  1 mg Oral L9J PRN Delora Fuel, MD      . metFORMIN (GLUCOPHAGE) tablet 500 mg  500 mg Oral BID WC Delora Fuel, MD   673 mg at  08/12/13 0836  . ondansetron (ZOFRAN) tablet 4 mg  4 mg Oral A1P PRN Delora Fuel, MD      . trihexyphenidyl (ARTANE) tablet 5 mg  5 mg Oral BID WC Delora Fuel, MD   5 mg at 37/90/24 0836  . zolpidem (AMBIEN) tablet 5 mg  5 mg Oral QHS PRN Delora Fuel, MD       Current Outpatient Prescriptions  Medication Sig Dispense Refill  . acetaminophen (TYLENOL) 500 MG tablet Take 1,000 mg by mouth every 6 (six) hours as needed for moderate pain.      . ARIPiprazole (ABILIFY MAINTENA) 400 MG SUSR Inject 400 mg into the muscle every 28 (twenty-eight) days. First dose received on 08/09/13 to be due again in twenty-eight days.  1 each  3  . ARIPiprazole (ABILIFY) 5 MG tablet Take 1 tablet (5 mg total) by mouth at bedtime. For mood stability  30 tablet  0  . gabapentin (NEURONTIN) 100 MG capsule Take 1 capsule (100 mg total) by mouth 2 (two) times daily.  60 capsule  0  . hydrOXYzine (ATARAX/VISTARIL) 50 MG tablet Take 1 tablet (50 mg total) by mouth at bedtime as needed and may repeat dose one time if needed (insomnia).  30 tablet  0  . lisinopril (PRINIVIL,ZESTRIL) 20 MG tablet Take 1 tablet (20 mg total) by mouth daily. For high blood pressure.  30 tablet  0  . metFORMIN (GLUCOPHAGE) 500 MG tablet Take 1 tablet (500 mg total) by mouth 2 (two) times daily with a meal.  60 tablet  0  . trihexyphenidyl (ARTANE) 5 MG tablet Take 1 tablet (  5 mg total) by mouth 2 (two) times daily with a meal.  60 tablet  0    Psychiatric Specialty Exam:     Blood pressure 140/73, pulse 94, temperature 99.2 F (37.3 C), temperature source Oral, resp. rate 14, SpO2 94.00%.There is no weight on file to calculate BMI.  General Appearance: Disheveled  Eye Contact::  Good  Speech:  Clear and Coherent  Volume:  Normal  Mood:  Anxious  Affect:  Appropriate  Thought Process:  Goal Directed  Orientation:  Full (Time, Place, and Person)  Thought Content:  WDL  Suicidal Thoughts:  No  Homicidal Thoughts:  No  Memory:  Immediate;    Good Recent;   Good Remote;   Good  Judgement:  Fair  Insight:  Fair  Psychomotor Activity:  Normal  Concentration:  Good  Recall:  Fair  Akathisia:  NA  Handed:  Right  AIMS (if indicated):     Assets:  Communication Skills Desire for Improvement Resilience  Sleep:      Treatment Plan Summary: Medication Management -Modify Abilify from 48m to 123mqhs for auditory hallucinations.   Pt has a followup appointment next week with [facility] to manage his current medications, which he was sent home on with a 14-day supply. Pt agreed to meet his follow-up appointment to get refills and medication titration as well as therapy and substance abuse help.  Disposition: Discharge home. Pt will follow-up with [facility] next week as scheduled from is [facility]10[facility] JarvisFNP-BC 08/12/2013 10:12 AM  Reviewed the information documented and agree with the treatment plan.  Amaziah Raisanen,JANARDHAHA R. 08/12/2013 2:03 PM

## 2013-08-12 NOTE — ED Notes (Signed)
Per EMS - pt from grocery store parking lot - [facility] pt has not had his prescription medications since being d/c'd - pt also reports LUQ pain x3 days. Pt admits to smoking crack and is now hearing voices.

## 2013-08-12 NOTE — ED Provider Notes (Signed)
CSN: [id number]     Arrival date & time 08/12/13  0256 History   First MD Initiated Contact with Patient 08/12/13 0351     Chief Complaint  Patient presents with  . Abdominal Pain     (Consider location/radiation/quality/duration/timing/severity/associated sxs/prior Treatment) Patient is a 53 y.o. male presenting with abdominal pain. The history is provided by the patient.  Abdominal Pain He has a history of schizophrenia and cocaine abuse and was just discharged from behavioral health hospital yesterday. He states he was given a starter care for his medications but did not know which pills for which they did know what to take and has not taken anything. He is complaining of hearing voices which are telling other people what he is doing and also telling him to do things such as washing his hands. He has paranoid ideation stating police are watching him with an ocular is. He denies suicidal ideation and homicidal ideation. He also states that he had some cramping in the left midabdomen but that has resolved. He denies nausea, vomiting, diarrhea. He does admit to using cocaine with last use about 3 hours ago.  Past Medical History  Diagnosis Date  . Diabetes mellitus without complication   . Hypertension   . Schizophrenia   . CHF (congestive heart failure)    History reviewed. No pertinent past surgical history. History reviewed. No pertinent family history. History  Substance Use Topics  . Smoking status: Current Every Day Smoker -- 2.00 packs/day    Types: Cigarettes  . Smokeless tobacco: Current User  . Alcohol Use: No     Comment: pt states no ETOH use    Review of Systems  Gastrointestinal: Positive for abdominal pain.  All other systems reviewed and are negative.      Allergies  Haldol  Home Medications   Current Outpatient Rx  Name  Route  Sig  Dispense  Refill  . acetaminophen (TYLENOL) 500 MG tablet   Oral   Take 1,000 mg by mouth every 6 (six) hours as needed  for moderate pain.         . ARIPiprazole (ABILIFY MAINTENA) 400 MG SUSR   Intramuscular   Inject 400 mg into the muscle every 28 (twenty-eight) days. First dose received on 08/09/13 to be due again in twenty-eight days.   1 each   3   . ARIPiprazole (ABILIFY) 5 MG tablet   Oral   Take 1 tablet (5 mg total) by mouth at bedtime. For mood stability   30 tablet   0   . gabapentin (NEURONTIN) 100 MG capsule   Oral   Take 1 capsule (100 mg total) by mouth 2 (two) times daily.   60 capsule   0   . hydrOXYzine (ATARAX/VISTARIL) 50 MG tablet   Oral   Take 1 tablet (50 mg total) by mouth at bedtime as needed and may repeat dose one time if needed (insomnia).   30 tablet   0   . lisinopril (PRINIVIL,ZESTRIL) 20 MG tablet   Oral   Take 1 tablet (20 mg total) by mouth daily. For high blood pressure.   30 tablet   0   . metFORMIN (GLUCOPHAGE) 500 MG tablet   Oral   Take 1 tablet (500 mg total) by mouth 2 (two) times daily with a meal.   60 tablet   0   . trihexyphenidyl (ARTANE) 5 MG tablet   Oral   Take 1 tablet (5 mg total) by mouth 2 (two)  times daily with a meal.   60 tablet   0    BP 175/91  Pulse 97  Temp(Src) 98.4 F (36.9 C) (Oral)  Resp 16  SpO2 98% Physical Exam  Nursing note and vitals reviewed.  53 year old male, resting comfortably and in no acute distress. Vital signs are significant for hypertension with blood pressure 175/91. Oxygen saturation is 98%, which is normal. He is alert and cooperative. Head is normocephalic and atraumatic. PERRLA, EOMI. Oropharynx is clear. Neck is nontender and supple without adenopathy or JVD. Back is nontender and there is no CVA tenderness. Lungs are clear without rales, wheezes, or rhonchi. Chest is nontender. Heart has regular rate and rhythm without murmur. Abdomen is soft, flat, nontender without masses or hepatosplenomegaly and peristalsis is normoactive. Extremities have no cyanosis or edema, full range of motion  is present. Skin is warm and dry without rash. Neurologic: Mental status is normal, cranial nerves are intact, there are no motor or sensory deficits.  ED Course  Procedures (including critical care time) Labs Review Results for orders placed during the hospital encounter of 08/12/13  CBC WITH DIFFERENTIAL      Result Value Ref Range   WBC 10.6 (*) 4.0 - 10.5 K/uL   RBC 3.44 (*) 4.22 - 5.81 MIL/uL   Hemoglobin 10.2 (*) 13.0 - 17.0 g/dL   HCT 16.130.9 (*) 09.639.0 - 04.552.0 %   MCV 89.8  78.0 - 100.0 fL   MCH 29.7  26.0 - 34.0 pg   MCHC 33.0  30.0 - 36.0 g/dL   RDW 40.915.1  81.111.5 - 91.415.5 %   Platelets 228  150 - 400 K/uL   Neutrophils Relative % 80 (*) 43 - 77 %   Neutro Abs 8.5 (*) 1.7 - 7.7 K/uL   Lymphocytes Relative 12  12 - 46 %   Lymphs Abs 1.3  0.7 - 4.0 K/uL   Monocytes Relative 7  3 - 12 %   Monocytes Absolute 0.8  0.1 - 1.0 K/uL   Eosinophils Relative 0  0 - 5 %   Eosinophils Absolute 0.0  0.0 - 0.7 K/uL   Basophils Relative 0  0 - 1 %   Basophils Absolute 0.0  0.0 - 0.1 K/uL  COMPREHENSIVE METABOLIC PANEL      Result Value Ref Range   Sodium 143  137 - 147 mEq/L   Potassium 4.0  3.7 - 5.3 mEq/L   Chloride 104  96 - 112 mEq/L   CO2 27  19 - 32 mEq/L   Glucose, Bld 137 (*) 70 - 99 mg/dL   BUN 14  6 - 23 mg/dL   Creatinine, Ser 7.820.79  0.50 - 1.35 mg/dL   Calcium 9.6  8.4 - 95.610.5 mg/dL   Total Protein 7.3  6.0 - 8.3 g/dL   Albumin 3.9  3.5 - 5.2 g/dL   AST 29  0 - 37 U/L   ALT 18  0 - 53 U/L   Alkaline Phosphatase 80  39 - 117 U/L   Total Bilirubin 0.7  0.3 - 1.2 mg/dL   GFR calc non Af Amer >90  >90 mL/min   GFR calc Af Amer >90  >90 mL/min  LIPASE, BLOOD      Result Value Ref Range   Lipase 14  11 - 59 U/L  URINALYSIS, ROUTINE W REFLEX MICROSCOPIC      Result Value Ref Range   Color, Urine YELLOW  YELLOW   APPearance CLEAR  CLEAR   Specific Gravity, Urine 1.020  1.005 - 1.030   pH 7.5  5.0 - 8.0   Glucose, UA 100 (*) NEGATIVE mg/dL   Hgb urine dipstick NEGATIVE   NEGATIVE   Bilirubin Urine NEGATIVE  NEGATIVE   Ketones, ur NEGATIVE  NEGATIVE mg/dL   Protein, ur 30 (*) NEGATIVE mg/dL   Urobilinogen, UA 1.0  0.0 - 1.0 mg/dL   Nitrite NEGATIVE  NEGATIVE   Leukocytes, UA NEGATIVE  NEGATIVE  URINE RAPID DRUG SCREEN (HOSP PERFORMED)      Result Value Ref Range   Opiates NONE DETECTED  NONE DETECTED   Cocaine POSITIVE (*) NONE DETECTED   Benzodiazepines NONE DETECTED  NONE DETECTED   Amphetamines NONE DETECTED  NONE DETECTED   Tetrahydrocannabinol NONE DETECTED  NONE DETECTED   Barbiturates NONE DETECTED  NONE DETECTED  ETHANOL      Result Value Ref Range   Alcohol, Ethyl (B) <11  0 - 11 mg/dL  GLUCOSE, CAPILLARY      Result Value Ref Range   Glucose-Capillary 151 (*) 70 - 99 mg/dL  URINE MICROSCOPIC-ADD ON      Result Value Ref Range   WBC, UA 3-6  <3 WBC/hpf   RBC / HPF 0-2  <3 RBC/hpf   Bacteria, UA RARE  RARE    MDM   Final diagnoses:  Schizophrenia  Cocaine dependence  Anemia    Exacerbation of schizophrenia. Cocaine abuse. Abdominal cramp which has resolved. Hypertension which is worse since he has not been taking his medication. Old records are reviewed and he had been discharged from behavioral health hospital on February 11. Screening labs are obtained and psychiatric consultation will be obtained with TTS. I am not certain whether his hallucinations are part of his baseline status or not, but they appeared to be somewhat benign.    Dione Booze, MD 08/12/13 [phone]

## 2013-08-12 NOTE — ED Notes (Signed)
[facility]

## 2013-08-12 NOTE — ED Notes (Signed)
Pt wanded by security. 

## 2013-08-13 NOTE — Progress Notes (Signed)
Patient Discharge Instructions:  After Visit Summary (AVS):   Faxed to:  08/12/13 Discharge Summary Note:   Faxed to:  08/12/13 Psychiatric Admission Assessment Note:   Faxed to:  08/12/13 Suicide Risk Assessment - Discharge Assessment:   Faxed to:  08/12/13 Faxed/Sent to the Next Level Care provider:  08/12/13 [facility] [phone] [facility] @ [phone]  Jerelene ReddenSheena E Franklin, 08/13/2013, 2:40 PM

## 2013-08-28 ENCOUNTER — Encounter (HOSPITAL_BASED_OUTPATIENT_CLINIC_OR_DEPARTMENT_OTHER): Payer: Self-pay | Admitting: Emergency Medicine

## 2013-08-28 ENCOUNTER — Emergency Department (HOSPITAL_BASED_OUTPATIENT_CLINIC_OR_DEPARTMENT_OTHER)
Admission: EM | Admit: 2013-08-28 | Discharge: 2013-08-28 | Disposition: A | Discharge status: Home / Self Care | Payer: Medicare Other | Attending: Emergency Medicine | Admitting: Emergency Medicine

## 2013-08-28 DIAGNOSIS — I10 Essential (primary) hypertension: Secondary | ICD-10-CM | POA: Insufficient documentation

## 2013-08-28 DIAGNOSIS — I509 Heart failure, unspecified: Secondary | ICD-10-CM | POA: Insufficient documentation

## 2013-08-28 DIAGNOSIS — Z711 Person with feared health complaint in whom no diagnosis is made: Secondary | ICD-10-CM

## 2013-08-28 DIAGNOSIS — Z79899 Other long term (current) drug therapy: Secondary | ICD-10-CM | POA: Insufficient documentation

## 2013-08-28 DIAGNOSIS — F17200 Nicotine dependence, unspecified, uncomplicated: Secondary | ICD-10-CM | POA: Insufficient documentation

## 2013-08-28 DIAGNOSIS — E119 Type 2 diabetes mellitus without complications: Secondary | ICD-10-CM | POA: Insufficient documentation

## 2013-08-28 DIAGNOSIS — N489 Disorder of penis, unspecified: Secondary | ICD-10-CM | POA: Insufficient documentation

## 2013-08-28 DIAGNOSIS — F209 Schizophrenia, unspecified: Secondary | ICD-10-CM | POA: Insufficient documentation
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
## 2013-08-28 NOTE — Discharge Instructions (Signed)
No sign of abnormality to your skin of your leg or your penis.  Emergency Department Screening Exam A medical screening exam helps find the cause of your problem. This exam also helps determine if your problem requires treatment right away. Your exam has shown that you do not require immediate emergency treatment. It is safe for you to go to your caregiver's office or clinic for treatment. Plans may have been made for you to be seen by your regular caregiver today. Patients must be treated in an emergency department regardless of their ability to pay. You can decide to stay and receive continued treatment in the emergency department. Some insurance plans may not cover the cost of this service. In some, but not all, states in the [place] the hospital and/or your caregiver might bill you directly for your evaluation and treatment in the emergency department. If your condition worsens or changes in any way, return for re-evaluation or you may go to your caregiver's office or clinic for treatment. Document Released: 09/13/2008 Document Revised: 09/09/2011 Document Reviewed: 09/13/2008 [facility] Patient Information 2014 [employer] [place].

## 2013-10-08 ENCOUNTER — Encounter (HOSPITAL_COMMUNITY): Payer: Self-pay | Admitting: Intensive Care

## 2013-10-08 ENCOUNTER — Inpatient Hospital Stay (HOSPITAL_COMMUNITY)
Admission: AD | Admit: 2013-10-08 | Discharge: 2013-10-13 | DRG: 885 | Disposition: A | Discharge status: Home / Self Care | Payer: Medicare Other | Source: Intra-hospital | Attending: Psychiatry | Admitting: Psychiatry

## 2013-10-08 ENCOUNTER — Emergency Department (HOSPITAL_COMMUNITY)
Admission: EM | Admit: 2013-10-08 | Discharge: 2013-10-08 | Disposition: A | Discharge status: Other Acute Inpatient Hospital | Payer: Medicare Other | Source: Home / Self Care | Attending: Emergency Medicine | Admitting: Emergency Medicine

## 2013-10-08 ENCOUNTER — Encounter (HOSPITAL_COMMUNITY): Payer: Self-pay | Admitting: Emergency Medicine

## 2013-10-08 DIAGNOSIS — F1910 Other psychoactive substance abuse, uncomplicated: Secondary | ICD-10-CM

## 2013-10-08 DIAGNOSIS — Z794 Long term (current) use of insulin: Secondary | ICD-10-CM

## 2013-10-08 DIAGNOSIS — I10 Essential (primary) hypertension: Secondary | ICD-10-CM | POA: Diagnosis present

## 2013-10-08 DIAGNOSIS — F1410 Cocaine abuse, uncomplicated: Secondary | ICD-10-CM

## 2013-10-08 DIAGNOSIS — Z598 Other problems related to housing and economic circumstances: Secondary | ICD-10-CM

## 2013-10-08 DIAGNOSIS — I509 Heart failure, unspecified: Secondary | ICD-10-CM | POA: Diagnosis present

## 2013-10-08 DIAGNOSIS — F411 Generalized anxiety disorder: Secondary | ICD-10-CM | POA: Diagnosis present

## 2013-10-08 DIAGNOSIS — G4700 Insomnia, unspecified: Secondary | ICD-10-CM | POA: Diagnosis present

## 2013-10-08 DIAGNOSIS — Z91199 Patient's noncompliance with other medical treatment and regimen due to unspecified reason: Secondary | ICD-10-CM

## 2013-10-08 DIAGNOSIS — F209 Schizophrenia, unspecified: Secondary | ICD-10-CM

## 2013-10-08 DIAGNOSIS — Z9119 Patient's noncompliance with other medical treatment and regimen: Secondary | ICD-10-CM

## 2013-10-08 DIAGNOSIS — F17200 Nicotine dependence, unspecified, uncomplicated: Secondary | ICD-10-CM | POA: Diagnosis present

## 2013-10-08 DIAGNOSIS — R45851 Suicidal ideations: Secondary | ICD-10-CM

## 2013-10-08 DIAGNOSIS — Z5987 Material hardship due to limited financial resources, not elsewhere classified: Secondary | ICD-10-CM

## 2013-10-08 DIAGNOSIS — F1420 Cocaine dependence, uncomplicated: Secondary | ICD-10-CM | POA: Diagnosis present

## 2013-10-08 DIAGNOSIS — Z5900 Homelessness unspecified: Secondary | ICD-10-CM

## 2013-10-08 DIAGNOSIS — F200 Paranoid schizophrenia: Principal | ICD-10-CM | POA: Diagnosis present

## 2013-10-08 DIAGNOSIS — E119 Type 2 diabetes mellitus without complications: Secondary | ICD-10-CM | POA: Diagnosis present

## 2013-10-08 DIAGNOSIS — R238 Other skin changes: Secondary | ICD-10-CM

## 2013-10-08 LAB — RAPID URINE DRUG SCREEN, HOSP PERFORMED
AMPHETAMINES: NOT DETECTED
Barbiturates: NOT DETECTED
Benzodiazepines: NOT DETECTED
COCAINE: POSITIVE — AB
Opiates: NOT DETECTED
TETRAHYDROCANNABINOL: NOT DETECTED

## 2013-10-08 LAB — CBC
HCT: 34.6 % — ABNORMAL LOW (ref 39.0–52.0)
HEMOGLOBIN: 11.5 g/dL — AB (ref 13.0–17.0)
MCH: 29.8 pg (ref 26.0–34.0)
MCHC: 33.2 g/dL (ref 30.0–36.0)
MCV: 89.6 fL (ref 78.0–100.0)
Platelets: 185 10*3/uL (ref 150–400)
RBC: 3.86 MIL/uL — AB (ref 4.22–5.81)
RDW: 13.9 % (ref 11.5–15.5)
WBC: 7.4 10*3/uL (ref 4.0–10.5)

## 2013-10-08 LAB — COMPREHENSIVE METABOLIC PANEL
ALT: 7 U/L (ref 0–53)
AST: 18 U/L (ref 0–37)
Albumin: 4 g/dL (ref 3.5–5.2)
Alkaline Phosphatase: 77 U/L (ref 39–117)
BUN: 19 mg/dL (ref 6–23)
CALCIUM: 9.8 mg/dL (ref 8.4–10.5)
CO2: 26 meq/L (ref 19–32)
Chloride: 102 mEq/L (ref 96–112)
Creatinine, Ser: 1.13 mg/dL (ref 0.50–1.35)
GFR calc Af Amer: 85 mL/min — ABNORMAL LOW (ref >90)
GFR, EST NON AFRICAN AMERICAN: 73 mL/min — AB (ref >90)
GLUCOSE: 146 mg/dL — AB (ref 70–99)
POTASSIUM: 3.4 meq/L — AB (ref 3.7–5.3)
SODIUM: 144 meq/L (ref 137–147)
Total Bilirubin: 0.6 mg/dL (ref 0.3–1.2)
Total Protein: 6.9 g/dL (ref 6.0–8.3)

## 2013-10-08 LAB — SALICYLATE LEVEL: Salicylate Lvl: <2 mg/dL — ABNORMAL LOW (ref 2.8–20.0)

## 2013-10-08 LAB — ACETAMINOPHEN LEVEL

## 2013-10-08 LAB — GLUCOSE, CAPILLARY
GLUCOSE-CAPILLARY: 133 mg/dL — AB (ref 70–99)
Glucose-Capillary: 149 mg/dL — ABNORMAL HIGH (ref 70–99)

## 2013-10-08 LAB — CBG MONITORING, ED: Glucose-Capillary: 154 mg/dL — ABNORMAL HIGH (ref 70–99)

## 2013-10-08 LAB — ETHANOL: Alcohol, Ethyl (B): <11 mg/dL (ref 0–11)

## 2013-10-08 MED ORDER — INSULIN ASPART 100 UNIT/ML ~~LOC~~ SOLN
4.0000 [IU] | Freq: Three times a day (TID) | SUBCUTANEOUS | Status: DC
  Administered 2013-10-08 – 2013-10-13 (×14): 4 [IU] via SUBCUTANEOUS

## 2013-10-08 MED ORDER — ARIPIPRAZOLE 10 MG PO TABS
10.0000 mg | ORAL_TABLET | Freq: Every day | ORAL | Status: DC
  Filled 2013-10-08: qty 1

## 2013-10-08 MED ORDER — MAGNESIUM HYDROXIDE 400 MG/5ML PO SUSP: 30.0000 mL | Freq: Every day | ORAL | Status: DC | PRN

## 2013-10-08 MED ORDER — ALUM & MAG HYDROXIDE-SIMETH 200-200-20 MG/5ML PO SUSP: 30.0000 mL | ORAL | Status: DC | PRN

## 2013-10-08 MED ORDER — INSULIN ASPART 100 UNIT/ML ~~LOC~~ SOLN
0.0000 [IU] | Freq: Three times a day (TID) | SUBCUTANEOUS | Status: DC
  Administered 2013-10-08: 2 [IU] via SUBCUTANEOUS
  Administered 2013-10-09: 3 [IU] via SUBCUTANEOUS
  Administered 2013-10-09 – 2013-10-12 (×7): 2 [IU] via SUBCUTANEOUS
  Administered 2013-10-13: 3 [IU] via SUBCUTANEOUS

## 2013-10-08 MED ORDER — METFORMIN HCL 500 MG PO TABS
500.0000 mg | ORAL_TABLET | Freq: Two times a day (BID) | ORAL | Status: DC
  Administered 2013-10-08 – 2013-10-13 (×10): 500 mg via ORAL
  Filled 2013-10-08 (×13): qty 1

## 2013-10-08 MED ORDER — LISINOPRIL 20 MG PO TABS
20.0000 mg | ORAL_TABLET | Freq: Every day | ORAL | Status: DC
  Administered 2013-10-08 – 2013-10-13 (×6): 20 mg via ORAL
  Filled 2013-10-08 (×9): qty 1

## 2013-10-08 MED ORDER — GABAPENTIN 100 MG PO CAPS
100.0000 mg | ORAL_CAPSULE | Freq: Two times a day (BID) | ORAL | Status: DC
  Administered 2013-10-08: 100 mg via ORAL
  Filled 2013-10-08 (×2): qty 1

## 2013-10-08 MED ORDER — ACETAMINOPHEN 325 MG PO TABS
650.0000 mg | ORAL_TABLET | Freq: Once | ORAL | Status: AC
Start: 2013-10-08 — End: 2013-10-08
  Administered 2013-10-08: 650 mg via ORAL
  Filled 2013-10-08: qty 2

## 2013-10-08 MED ORDER — IBUPROFEN 200 MG PO TABS: 600.0000 mg | ORAL_TABLET | Freq: Three times a day (TID) | ORAL | Status: DC | PRN

## 2013-10-08 MED ORDER — NICOTINE 21 MG/24HR TD PT24
21.0000 mg | MEDICATED_PATCH | Freq: Once | TRANSDERMAL | Status: DC
  Administered 2013-10-08: 21 mg via TRANSDERMAL
  Filled 2013-10-08: qty 1

## 2013-10-08 MED ORDER — GABAPENTIN 100 MG PO CAPS
100.0000 mg | ORAL_CAPSULE | Freq: Two times a day (BID) | ORAL | Status: DC
  Administered 2013-10-08 – 2013-10-11 (×6): 100 mg via ORAL
  Filled 2013-10-08 (×9): qty 1

## 2013-10-08 MED ORDER — ZOLPIDEM TARTRATE 5 MG PO TABS: 5.0000 mg | ORAL_TABLET | Freq: Every evening | ORAL | Status: DC | PRN

## 2013-10-08 MED ORDER — INSULIN ASPART 100 UNIT/ML ~~LOC~~ SOLN
0.0000 [IU] | Freq: Three times a day (TID) | SUBCUTANEOUS | Status: DC
  Administered 2013-10-08: 2 [IU] via SUBCUTANEOUS

## 2013-10-08 MED ORDER — TRAZODONE HCL 100 MG PO TABS
100.0000 mg | ORAL_TABLET | Freq: Every evening | ORAL | Status: DC | PRN
  Administered 2013-10-08 – 2013-10-09 (×2): 100 mg via ORAL
  Filled 2013-10-08 (×2): qty 1

## 2013-10-08 MED ORDER — INSULIN ASPART 100 UNIT/ML ~~LOC~~ SOLN: 0.0000 [IU] | Freq: Every day | SUBCUTANEOUS | Status: DC

## 2013-10-08 MED ORDER — METFORMIN HCL 500 MG PO TABS
500.0000 mg | ORAL_TABLET | Freq: Two times a day (BID) | ORAL | Status: DC
  Administered 2013-10-08: 500 mg via ORAL
  Filled 2013-10-08: qty 1

## 2013-10-08 MED ORDER — HYDROXYZINE HCL 25 MG PO TABS: 50.0000 mg | ORAL_TABLET | Freq: Every evening | ORAL | Status: DC | PRN

## 2013-10-08 MED ORDER — ARIPIPRAZOLE 5 MG PO TABS
5.0000 mg | ORAL_TABLET | Freq: Every day | ORAL | Status: DC
  Administered 2013-10-08 – 2013-10-09 (×2): 5 mg via ORAL
  Filled 2013-10-08 (×5): qty 1

## 2013-10-08 MED ORDER — ONDANSETRON HCL 4 MG PO TABS: 4.0000 mg | ORAL_TABLET | Freq: Three times a day (TID) | ORAL | Status: DC | PRN

## 2013-10-08 MED ORDER — ACETAMINOPHEN 325 MG PO TABS
650.0000 mg | ORAL_TABLET | Freq: Four times a day (QID) | ORAL | Status: DC | PRN
  Administered 2013-10-08 – 2013-10-11 (×7): 650 mg via ORAL
  Filled 2013-10-08 (×7): qty 2

## 2013-10-08 MED ORDER — TRIHEXYPHENIDYL HCL 5 MG PO TABS
5.0000 mg | ORAL_TABLET | Freq: Two times a day (BID) | ORAL | Status: DC
  Administered 2013-10-08 – 2013-10-13 (×10): 5 mg via ORAL
  Filled 2013-10-08 (×13): qty 1

## 2013-10-08 MED ORDER — ACETAMINOPHEN 325 MG PO TABS
650.0000 mg | ORAL_TABLET | ORAL | Status: DC | PRN
  Administered 2013-10-08 (×2): 650 mg via ORAL
  Filled 2013-10-08 (×2): qty 2

## 2013-10-08 MED ORDER — LISINOPRIL 20 MG PO TABS
20.0000 mg | ORAL_TABLET | Freq: Every day | ORAL | Status: DC
  Administered 2013-10-08: 20 mg via ORAL
  Filled 2013-10-08: qty 1

## 2013-10-08 NOTE — ED Notes (Signed)
Pt informed of dietary not able to send a second tray d/t carb modified diet, pt cooperative, pt requesting coffee

## 2013-10-08 NOTE — Progress Notes (Signed)
Received report from admitting RN.

## 2013-10-08 NOTE — Progress Notes (Signed)
[facility] Group Notes:  (Nursing/MHT/Case Management/Adjunct)  Date:  10/08/2013  Time:  8:00 p.m.   Type of Therapy:  Psychoeducational Skills  Participation Level:  Active  Participation Quality:  Appropriate  Affect:  Appropriate  Cognitive:  Appropriate  Insight:  Good  Engagement in Group:  Engaged  Modes of Intervention:  Education  Summary of Progress/Problems: The patient described his day in group as having been "so so". The patient admitted to his drug history and that it will be quite difficult to end the habit unless he attends some form of long-term treatment. He also stated that the first step in the recovery process is for him to attend either an A.A. Meeting or an N.A. Meeting. As a theme for the day, his relapse prevention will involve prayer.   Westly PamBenjamin S Amelya Mabry 10/08/2013, 9:31 PM

## 2013-10-08 NOTE — BH Assessment (Signed)
[facility]      Dr Jannifer FranklinAkintayo agreed to admit him to the 400 hall for stabilization.Notified [place] of disposition.

## 2013-10-08 NOTE — ED Provider Notes (Signed)
CSN: [id number]     Arrival date & time 10/08/13  0135 History   First MD Initiated Contact with Patient 10/08/13 0155     Chief Complaint  Patient presents with  . Foot Pain  . Suicidal     (Consider location/radiation/quality/duration/timing/severity/associated sxs/prior Treatment) HPI 53 year old male presents to the emergency department with complaint of bilateral toe pain, crack cocaine abuse, and suicidal ideation.  Patient reports he recently was released from jail.  He has been on a crack binge for the last 2 days.  Patient reports he's been wearing ill fitting shoes, and has rubbed blisters across the top of his toes.  He reports a burning type pain to the inside of both great toes.  He has history of diabetes.  He has not been taking his medication since being on a crack binge.  Patient is acutely manic, with pressured speech.  He reports that he is having auditory hallucinations, and plans to jump in front of a train if he is not given proper mental health today. Past Medical History  Diagnosis Date  . Diabetes mellitus without complication   . Hypertension   . Schizophrenia   . CHF (congestive heart failure)    History reviewed. No pertinent past surgical history. History reviewed. No pertinent family history. History  Substance Use Topics  . Smoking status: Current Every Day Smoker -- 2.00 packs/day    Types: Cigarettes  . Smokeless tobacco: Current User  . Alcohol Use: No     Comment: pt states no ETOH use    Review of Systems  Unable to perform ROS: Psychiatric disorder      Allergies  Haldol  Home Medications   Current Outpatient Rx  Name  Route  Sig  Dispense  Refill  . acetaminophen (TYLENOL) 500 MG tablet   Oral   Take 1,000 mg by mouth every 6 (six) hours as needed for moderate pain.         . ARIPiprazole (ABILIFY MAINTENA) 400 MG SUSR   Intramuscular   Inject 400 mg into the muscle every 28 (twenty-eight) days. First dose received on 08/09/13 to  be due again in twenty-eight days.   1 each   3   . ARIPiprazole (ABILIFY) 10 MG tablet   Oral   Take 1 tablet (10 mg total) by mouth at bedtime.   30 tablet   0   . gabapentin (NEURONTIN) 100 MG capsule   Oral   Take 1 capsule (100 mg total) by mouth 2 (two) times daily.   60 capsule   0   . hydrOXYzine (ATARAX/VISTARIL) 50 MG tablet   Oral   Take 1 tablet (50 mg total) by mouth at bedtime as needed and may repeat dose one time if needed (insomnia).   30 tablet   0   . lisinopril (PRINIVIL,ZESTRIL) 20 MG tablet   Oral   Take 1 tablet (20 mg total) by mouth daily. For high blood pressure.   30 tablet   0   . metFORMIN (GLUCOPHAGE) 500 MG tablet   Oral   Take 1 tablet (500 mg total) by mouth 2 (two) times daily with a meal.   60 tablet   0   . trihexyphenidyl (ARTANE) 5 MG tablet   Oral   Take 1 tablet (5 mg total) by mouth 2 (two) times daily with a meal.   60 tablet   0    BP 147/77  Pulse 88  Temp(Src) 99 F (37.2 C) (Oral)  Resp 18  SpO2 97% Physical Exam  Nursing note and vitals reviewed. Constitutional: He is oriented to person, place, and time. He appears well-developed and well-nourished. He appears distressed.  HENT:  Head: Normocephalic and atraumatic.  Nose: Nose normal.  Mouth/Throat: Oropharynx is clear and moist.  Eyes: Conjunctivae and EOM are normal. Pupils are equal, round, and reactive to light.  Neck: Normal range of motion. Neck supple. No JVD present. No tracheal deviation present. No thyromegaly present.  Cardiovascular: Normal rate, regular rhythm, normal heart sounds and intact distal pulses.  Exam reveals no gallop and no friction rub.   No murmur heard. Pulmonary/Chest: Effort normal and breath sounds normal. No stridor. No respiratory distress. He has no wheezes. He has no rales. He exhibits no tenderness.  Abdominal: Soft. Bowel sounds are normal. He exhibits no distension and no mass. There is no tenderness. There is no rebound  and no guarding.  Musculoskeletal: Normal range of motion. He exhibits tenderness. He exhibits no edema.  Patient has open blisters to the top of his second right toe and third left toe.  He reports pain to the medial aspect of both great toes, callus formation noticed there.  No overlying erythema or skin breakdown noted  Lymphadenopathy:    He has no cervical adenopathy.  Neurological: He is alert and oriented to person, place, and time. He exhibits normal muscle tone. Coordination normal.  Skin: Skin is warm and dry. No rash noted. No erythema. No pallor.  Psychiatric:  Patient appears to be acutely manic, with pressured speech, flight of ideas.  He has poor insight to his own condition.    ED Course  Procedures (including critical care time) Labs Review Labs Reviewed  CBC - Abnormal; Notable for the following:    RBC 3.86 (*)    Hemoglobin 11.5 (*)    HCT 34.6 (*)    All other components within normal limits  COMPREHENSIVE METABOLIC PANEL - Abnormal; Notable for the following:    Potassium 3.4 (*)    Glucose, Bld 146 (*)    GFR calc non Af Amer 73 (*)    GFR calc Af Amer 85 (*)    All other components within normal limits  SALICYLATE LEVEL - Abnormal; Notable for the following:    Salicylate Lvl <2.0 (*)    All other components within normal limits  URINE RAPID DRUG SCREEN (HOSP PERFORMED) - Abnormal; Notable for the following:    Cocaine POSITIVE (*)    All other components within normal limits  CBG MONITORING, ED - Abnormal; Notable for the following:    Glucose-Capillary 154 (*)    All other components within normal limits  ACETAMINOPHEN LEVEL  ETHANOL   Imaging Review No results found.   EKG Interpretation None      MDM   Final diagnoses:  Blisters of multiple sites  Cocaine abuse  Schizophrenia    53 year old male with mania, possibly secondary to recent crack cocaine use.  Patient reports history of CHF, no signs on exam.  Wounds on feet are  superficial.  He may be having some neuropathy secondary to his diabetes.  He is medically cleared.  Holding orders have been written.   Olivia Mackie, MD 10/08/13 [phone]

## 2013-10-08 NOTE — Progress Notes (Signed)
ED CM noted patient to be medically cleared, and awaiting TTS assessment.

## 2013-10-08 NOTE — Progress Notes (Signed)
Patient ID: [name], male   DOB: 07/12/1960, 53 y.o.   MRN: [id number] D. The patient has a blunted mood and affect. Stated that he has been to [facility] at least twenty times and he relapses almost immediately upon discharge. Reports that he has been to prison ten times for various charges related to his drug use. He stated that he needs long term treatment to stay clean from drugs. A. Met with patient to assess. Verbal support given. Encouraged to attend evening wrap up group.  R. The patient denied any suicidal/homicaidal ideation. Denied any a/v hallucinations at present. Attended and actively participated in evening group. Reported that attending AA/NA meetings has to be part of his recovery plan to prevent relapse.

## 2013-10-08 NOTE — ED Notes (Signed)
Attempted to call report

## 2013-10-08 NOTE — ED Notes (Signed)
All personal belongings were placed in holding area, all valuables were locked up in security safe.

## 2013-10-08 NOTE — ED Notes (Signed)
Attempted report to [facility] 

## 2013-10-08 NOTE — ED Provider Notes (Signed)
10:53 AM Pt [facility]. I was not directly involved in pt care or disposition planning.   1. Blisters of multiple sites   2. Cocaine abuse   3. Schizophrenia      Shanna CiscoMegan E Docherty, MD 10/08/13 1053

## 2013-10-08 NOTE — ED Notes (Signed)
Per [facility], pt with foot pain and c/o suicidal ideations.  Pt sts he has been hearing voices that have been telling him to "kill himself".  Pt has a plan and sts he would "jump in front of a train".  Pt takes abilify injections and has not had one in 2 months; sts "i have not been taking care of myself". Pt reports doing "crack all day"; denies any other drug usage.
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
## 2013-10-08 NOTE — Tx Team (Signed)
Initial Interdisciplinary Treatment Plan  PATIENT STRENGTHS: (choose at least two) Communication skills Motivation for treatment/growth Religious Affiliation Supportive family/friends  PATIENT STRESSORS: Financial difficulties Health problems Marital or family conflict Medication change or noncompliance Occupational concerns Substance abuse   PROBLEM LIST: Problem List/Patient Goals Date to be addressed Date deferred Reason deferred Estimated date of resolution  Psychosis 10/08/13                                                      DISCHARGE CRITERIA:  Ability to meet basic life and health needs Adequate post-discharge living arrangements Improved stabilization in mood, thinking, and/or behavior Medical problems require only outpatient monitoring  PRELIMINARY DISCHARGE PLAN: Attend aftercare/continuing care group Outpatient therapy  PATIENT/FAMIILY INVOLVEMENT: This treatment plan has been presented to and reviewed with the patient, [name].  [name] 10/08/2013, 1:49 PM

## 2013-10-09 ENCOUNTER — Encounter (HOSPITAL_COMMUNITY): Payer: Self-pay | Admitting: Psychiatry

## 2013-10-09 DIAGNOSIS — F200 Paranoid schizophrenia: Principal | ICD-10-CM | POA: Diagnosis present

## 2013-10-09 DIAGNOSIS — R45851 Suicidal ideations: Secondary | ICD-10-CM

## 2013-10-09 LAB — GLUCOSE, CAPILLARY
GLUCOSE-CAPILLARY: 133 mg/dL — AB (ref 70–99)
GLUCOSE-CAPILLARY: 131 mg/dL — AB (ref 70–99)
GLUCOSE-CAPILLARY: 151 mg/dL — AB (ref 70–99)
Glucose-Capillary: 137 mg/dL — ABNORMAL HIGH (ref 70–99)

## 2013-10-09 MED ORDER — NICOTINE 21 MG/24HR TD PT24
21.0000 mg | MEDICATED_PATCH | Freq: Every day | TRANSDERMAL | Status: DC
  Administered 2013-10-09 – 2013-10-13 (×5): 21 mg via TRANSDERMAL
  Filled 2013-10-09 (×6): qty 1

## 2013-10-09 MED ORDER — CLONIDINE HCL 0.1 MG PO TABS
0.1000 mg | ORAL_TABLET | Freq: Two times a day (BID) | ORAL | Status: DC
  Administered 2013-10-09 – 2013-10-11 (×4): 0.1 mg via ORAL
  Filled 2013-10-09 (×6): qty 1

## 2013-10-09 MED ORDER — ARIPIPRAZOLE 5 MG PO TABS
5.0000 mg | ORAL_TABLET | Freq: Two times a day (BID) | ORAL | Status: DC
  Administered 2013-10-09 – 2013-10-13 (×8): 5 mg via ORAL
  Filled 2013-10-09 (×10): qty 1

## 2013-10-09 NOTE — [facility] Counselor (Signed)
Adult Psychosocial Assessment Update Interdisciplinary Team  [facility] admissions/discharges:  Admissions Discharges  Date: 03/12/09 Date: 03/15/09  Date: 04/17/09 Date: 04/19/09  Date: 03/26/12 Date: 04/23/12  Date: 04/18/13 Date: 04/20/13  Date: 05/26/13 Date: 05/28/13  Date: 08/04/13 Date: 08/10/13   Changes since the last Psychosocial Assessment (including adherence to outpatient mental health and/or substance abuse treatment, situational issues contributing to decompensation and/or relapse).  No significant changes reported by pt since last admission             Discharge Plan 1. Will you be returning to the same living situation after discharge?   Yes: No:      If no, what is your plan?    Pt plans to return to residence when he discharges       2. Would you like a referral for services when you are discharged? Yes:     If yes, for what services?  No:        Pt reports he wants to follow[facility]       Summary and Recommendations (to be completed by the evaluator) Pt would benefit from acute stay due to SI and cocaine abuse.  Pt is agreeable to treatment and with follow-up services.                        Signature:  Seabron SpatesRachel Anne Mikenzi Raysor, 10/09/2013 4:44 PM

## 2013-10-09 NOTE — Progress Notes (Signed)
Adult Psychoeducational Group Note  Date:  10/09/2013 Time:  3:57 PM  Group Topic/Focus:  therapeutic activity  Participation Level:    Participation Quality:    Affect:    Cognitive:    Insight:   Engagement in Group:    Modes of Intervention:    Additional Comments: pt did not attend group.  Dickie LaRyta Kirk Basquez 10/09/2013, 3:57 PM

## 2013-10-09 NOTE — [facility] Suicide Risk Assessment (Signed)
   Nursing information obtained from:    Demographic factors:    Current Mental Status:    Loss Factors:    Historical Factors:    Risk Reduction Factors:    Total Time spent with patient: 30 minutes  CLINICAL FACTORS:   Alcohol/Substance Abuse/Dependencies Schizophrenia:   Command hallucinatons Paranoid or undifferentiated type Currently Psychotic Unstable or Poor Therapeutic Relationship  Psychiatric Specialty Exam: Physical Exam  Psychiatric: His affect is labile. His speech is rapid and/or pressured. He is actively hallucinating. Thought content is paranoid. Cognition and memory are normal. He expresses impulsivity. He expresses suicidal ideation.    Review of Systems  Constitutional: Negative.   HENT: Negative.   Eyes: Negative.   Respiratory: Negative.   Cardiovascular: Negative.   Gastrointestinal: Negative.   Genitourinary: Negative.   Musculoskeletal: Negative.   Skin: Negative.   Neurological: Negative.   Endo/Heme/Allergies: Negative.   Psychiatric/Behavioral: Positive for hallucinations and substance abuse. The patient has insomnia.     Blood pressure 166/93, pulse 83, temperature 98.3 F (36.8 C), temperature source Oral, resp. rate 18, height 5\' 9"  (1.753 m), weight 77.111 kg (170 lb), SpO2 100.00%.Body mass index is 25.09 kg/(m^2).  General Appearance: Disheveled  Eye Contact::  Good  Speech:  Garbled  Volume:  Normal  Mood:  Anxious  Affect:  Full Range  Thought Process:  Circumstantial and Disorganized  Orientation:  Full (Time, Place, and Person)  Thought Content:  Delusions, Hallucinations: Auditory and Paranoid Ideation  Suicidal Thoughts:  Yes.  without intent/plan  Homicidal Thoughts:  No  Memory:  Immediate;   Fair Recent;   Fair Remote;   Fair  Judgement:  Poor  Insight:  Lacking  Psychomotor Activity:  Increased  Concentration:  Fair  Recall:  [employer]  Fund of Knowledge:Fair  Language: Fair  Akathisia:  No  Handed:  Right  AIMS (if  indicated):     Assets:  Communication Skills Desire for Improvement Physical Health  Sleep:  Number of Hours: 5.75   Musculoskeletal: Strength & Muscle Tone: within normal limits Gait & Station: normal Patient leans: N/A  COGNITIVE FEATURES THAT CONTRIBUTE TO RISK:  Closed-mindedness Polarized thinking    SUICIDE RISK:   Mild:  Suicidal ideation of limited frequency, intensity, duration, and specificity.  There are no identifiable plans, no associated intent, mild dysphoria and related symptoms, good self-control (both objective and subjective assessment), few other risk factors, and identifiable protective factors, including available and accessible social support.  PLAN OF CARE:1. Admit for crisis management and stabilization. 2. Medication management to reduce current symptoms to base line and improve the     patient's overall level of functioning 3. Treat health problems as indicated. 4. Develop treatment plan to decrease risk of relapse upon discharge and the need for     readmission. 5. Psycho-social education regarding relapse prevention and self care. 6. Health care follow up as needed for medical problems. 7. Restart home medications where appropriate.   I certify that inpatient services furnished can reasonably be expected to improve the patient's condition.  Thedore MinsMojeed Meldon Hanzlik, MD 10/09/2013, 12:26 PM

## 2013-10-09 NOTE — Progress Notes (Signed)
[facility] Group Notes:  (Nursing/MHT/Case Management/Adjunct)  Date:  10/09/2013  Time:  8:00 p.m.   Type of Therapy:  Psychoeducational Skills  Participation Level:  Active  Participation Quality:  Attentive  Affect:  Excited  Cognitive:  Disorganized  Insight:  Lacking  Engagement in Group:  Off Topic  Modes of Intervention:  Education  Summary of Progress/Problems: The patient offered few details about his day. He mentioned that his day was about nearly the same as yesterday. He had to be redirected for talking about issues prior to his admission to the hospital, ie. Drug use, incarcerations, fighting on the streets. As a theme for the day, his support system will consist of his cousin. He mentioned that his cousin has promised to give him a car and a house to live in provided that he ends his drug use.   Westly PamBenjamin S Geri Hepler 10/09/2013, 9:48 PM

## 2013-10-09 NOTE — Progress Notes (Signed)
Patient ID: Taylor DouglasFredrick L Standre, male   DOB: 1961-02-05, 53 y.o.   MRN: [id number] 10-09-13 @ 1613 nursing shift note: pt has been very pleasant and visible in the milieu. He denied any si/hi/av. He stated he has "matured and feels good". A: he had some toe pain earlier that was addressed with tylenol. He has not voiced any other complaints. R: on his inventory sheet he wrote: slept well, appetite good, energy normal, attention good with his depression at 7 and hopelessness 5. W/d symptoms have been diarrhea,  but pt has not verbally mentioned any diarrhea. R: after discharge he plans to "take all my med's". RN will monitor and Q 15 min ck's continue.

## 2013-10-09 NOTE — [facility] Group Notes (Signed)
[facility] LCSW Group Therapy  10/09/2013 12:56 PM  Type of Therapy:  Group Therapy  Participation Level:  Active  Participation Quality:  Inattentive and Redirectable  Affect:  Appropriate  Cognitive:  Alert and Oriented  Insight:  Developing/Improving, Engaged and Supportive  Engagement in Therapy:  Developing/Improving, Engaged and Supportive  Modes of Intervention:  Discussion, Education, Exploration, Rapport Building and Support  Summary of Progress/Problems: Pt was engaged and would at times go off topic of discussion.  Pt was able to identify positive supports and a coping skill that would be effective if supports are unavailable.  Pt was able to join discussion and engaged in others sharing.   Taylor SpatesRachel Anne [name] 10/09/2013, 12:56 PM

## 2013-10-09 NOTE — Progress Notes (Signed)
Patient ID: [name], male   DOB: January 03, 1961, 53 y.o.   MRN: [id number] Psychoeducational Group Note  Date:  10/09/2013 Time:0930am  Group Topic/Focus:  Identifying Needs:   The focus of this group is to help patients identify their personal needs that have been historically problematic and identify healthy behaviors to address their needs.  Participation Level:  Active  Participation Quality:  Appropriate  Affect:  Appropriate  Cognitive:  Appropriate  Insight:  Supportive  Engagement in Group:  Supportive  Additional Comments:  Inventory group   [name], [name] 10/09/2013,1:40 PM

## 2013-10-09 NOTE — Progress Notes (Signed)
Patient ID: [name], male   DOB: [birth year], 53 y.o.   MRN: [id number] Psychoeducational Group Note  Date:  10/09/2013 Time:0945am  Group Topic/Focus:  Identifying Needs:   The focus of this group is to help patients identify their personal needs that have been historically problematic and identify healthy behaviors to address their needs.  Participation Level:  Active  Participation Quality:  Appropriate  Affect:  Appropriate  Cognitive:  Appropriate  Insight:  Supportive  Engagement in Group:  Supportive  Additional Comments:  Healthy coping skills.   [name], [name] 10/09/2013,1:41 PM

## 2013-10-09 NOTE — H&P (Signed)
Psychiatric Admission Assessment Adult  Patient Identification:  Taylor DouglasFredrick L Wnek Date of Evaluation:  10/09/2013 Chief Complaint: " I have been doing cocaine and hearing voices telling me to hurt myself.'' History of Present Illness: Patient is well known to [address], he is a 53 year old male with history of Schizophrenia and cocaine dependence. He reports that he came  to the emergency department with complaint of bilateral toe pain, crack cocaine abuse, and suicidal ideation. Patient reports that he was recently  released from jail due to tresspassing. He has been on a crack binge for the last 2 days. Patient reports he's been wearing ill fitting shoes, and has rubbed blisters across the top of his toes. He reports a burning type pain to the inside of both great toes. He has history of diabetes. He has not been compliant with his medications and after care instead he has been self medicating with crack cocaine. Patient is acutely manic, euphoric  with pressured speech. He reports that he is having auditory hallucinations, hearing voices telling him to lay in front of moving traffic or jump in front of a moving train.  Elements: Location: Depression, Psychosis  Quality: chronic.  Severity: moderate.  Timing: 1 month.  Duration: [facility] Context: homeless, non compliant, continuing to use cocaine and alcohol.  Associated Signs/Synptoms:  Depression Symptoms: depressed mood,  insomnia,  fatigue,  feelings of worthlessness/guilt,  hopelessness,  recurrent thoughts of death,  anxiety,  (Hypo) Manic Symptoms: Impulsivity,  Irritable Mood,  Anxiety Symptoms: Excessive Worry,  Panic Symptoms,  Psychotic Symptoms: Hallucinations: Auditory  PTSD Symptoms:  NA   Total Time spent with patient: 30 minutes  Psychiatric Specialty Exam: Physical Exam  ROS  Blood pressure 166/93, pulse 83, temperature 98.3 F (36.8 C), temperature source Oral, resp. rate 18,  height 5\' 9"  (1.753 m), weight 77.111 kg (170 lb), SpO2 100.00%.Body mass index is 25.09 kg/(m^2).  General Appearance: Disheveled  Eye Contact::  Good  Speech:  Garbled  Volume:  Normal  Mood:  Euphoric  Affect:  Full Range  Thought Process:  Disorganized  Orientation:  Full (Time, Place, and Person)  Thought Content:  Delusions and Hallucinations: Auditory Command:  voices telling him to lay in front of traffic  Suicidal Thoughts:  Yes.  without intent/plan  Homicidal Thoughts:  No  Memory:  Immediate;   Fair Recent;   Fair Remote;   Fair  Judgement:  Poor  Insight:  Lacking  Psychomotor Activity:  Increased  Concentration:  Poor  Recall:  [employer]Fair  Fund of Knowledge:Fair  Language: Fair  Akathisia:  No  Handed:  Right  AIMS (if indicated):     Assets:  Communication Skills Desire for Improvement Physical Health  Sleep:  Number of Hours: 5.75    Musculoskeletal: Strength & Muscle Tone: within normal limits Gait & Station: normal Patient leans: N/A  Past Psychiatric History:Yes  Diagnosis:Schizophrenia   [facility]ospital[facility]   Outpatient Care:[facility]   Substance Abuse Care:Patient reports once in the past at a facility that is now closed.   Self-Mutilation:Denies   Suicidal Attempts:No   Violent Behaviors:No      Past Medical History:   Past Medical History  Diagnosis Date  . Diabetes mellitus without complication   . Hypertension   . Schizophrenia   . CHF (congestive heart failure)    None. Allergies:   Allergies  Allergen Reactions  . Haldol [Haloperidol] Other (  See Comments)    Muscle spasms, loss of voluntary movement   PTA Medications: Prescriptions prior to admission  Medication Sig Dispense Refill  . acetaminophen (TYLENOL) 500 MG tablet Take 1,000 mg by mouth every 6 (six) hours as needed for moderate pain.      . ARIPiprazole (ABILIFY MAINTENA) 400 MG SUSR Inject 400 mg into the muscle every 28  (twenty-eight) days. First dose received on 08/09/13 to be due again in twenty-eight days.  1 each  3  . ARIPiprazole (ABILIFY) 10 MG tablet Take 1 tablet (10 mg total) by mouth at bedtime.  30 tablet  0  . gabapentin (NEURONTIN) 100 MG capsule Take 1 capsule (100 mg total) by mouth 2 (two) times daily.  60 capsule  0  . hydrOXYzine (ATARAX/VISTARIL) 50 MG tablet Take 1 tablet (50 mg total) by mouth at bedtime as needed and may repeat dose one time if needed (insomnia).  30 tablet  0  . lisinopril (PRINIVIL,ZESTRIL) 20 MG tablet Take 1 tablet (20 mg total) by mouth daily. For high blood pressure.  30 tablet  0  . metFORMIN (GLUCOPHAGE) 500 MG tablet Take 1 tablet (500 mg total) by mouth 2 (two) times daily with a meal.  60 tablet  0  . trihexyphenidyl (ARTANE) 5 MG tablet Take 1 tablet (5 mg total) by mouth 2 (two) times daily with a meal.  60 tablet  0    Previous Psychotropic Medications:  Medication/Dose  Prolixin, Haldol               Substance Abuse History in the last 12 months:  yes  Consequences of Substance Abuse: Legal Consequences:  tresspassing  Social History:  reports that he has been smoking Cigarettes.  He has been smoking about 2.00 packs per day. He uses smokeless tobacco. He reports that he uses illicit drugs ("Crack" cocaine and Cocaine) about 7 times per week. He reports that he does not drink alcohol. Additional Social History:                      Current Place of Residence:   Place of Birth:   Family Members: Marital Status:  Single Children:  Sons:  Daughters: Relationships: Education:  [employer] Problems/Performance: Religious Beliefs/Practices: History of Abuse (Emotional/Phsycial/Sexual) Teacher, music History:  None. Legal History: Hobbies/Interests:  Family History:  History reviewed. No pertinent family history.  Results for orders placed during the hospital encounter of 10/08/13 (from the past 72  hour(s))  GLUCOSE, CAPILLARY     Status: Abnormal   Collection Time    10/08/13  4:53 PM      Result Value Ref Range   Glucose-Capillary 149 (*) 70 - 99 mg/dL  GLUCOSE, CAPILLARY     Status: Abnormal   Collection Time    10/08/13  8:57 PM      Result Value Ref Range   Glucose-Capillary 133 (*) 70 - 99 mg/dL  GLUCOSE, CAPILLARY     Status: Abnormal   Collection Time    10/09/13  5:59 AM      Result Value Ref Range   Glucose-Capillary 133 (*) 70 - 99 mg/dL   Comment 1 Notify RN     Comment 2 Documented in Chart    GLUCOSE, CAPILLARY     Status: Abnormal   Collection Time    10/09/13 11:41 AM      Result Value Ref Range   Glucose-Capillary 131 (*) 70 - 99 mg/dL  Psychological Evaluations:  Assessment:   DSM5:  Schizophrenia Disorders:  Delusional Disorder (297.1) and Schizophrenia (295.7) Obsessive-Compulsive Disorders:   Trauma-Stressor Disorders:   Substance/Addictive Disorders:  Cocaine use disorder Depressive Disorders:  Disruptive Mood Dysregulation Disorder (296.99)  AXIS I:  Paranoid schizophrenia, chronic condition              Cocaine use disorder AXIS II:  Cluster B Traits AXIS III:   Past Medical History  Diagnosis Date  . Diabetes mellitus without complication   . Hypertension   . Schizophrenia   . CHF (congestive heart failure)    AXIS IV:  economic problems, housing problems, other psychosocial or environmental problems and problems related to social environment AXIS V:  21-30 behavior considerably influenced by delusions or hallucinations OR serious impairment in judgment, communication OR inability to function in almost all areas  Treatment Plan/Recommendations: 1. Admit for crisis management and stabilization.  2. Medication management to reduce current symptoms to base line and improve the patient's overall level of functioning : -Increase Abilify to 5mg  bid for psychosis/delusions/mood -Continue Artane 5mg  bid for EPS prevention 3. Treat health  problems as indicated. Restart medications to address chronic health problems to include Lisinopril 20 mg daily for hypertension, Metformin 500 mg BID for diabetes along with Novolog SSI and 4 units of meal coverage. Neurontin 100 mg BID for neuropathic pain/improved mood stability.  4. Develop treatment plan to decrease risk of relapse upon discharge and to reduce the need for readmission.  5. Psycho-social education regarding relapse prevention and self care.  6. Health care follow up as needed for medical problems.  7. Restart home medications where appropriate.  Treatment Plan Summary: Daily contact with patient to assess and evaluate symptoms and progress in treatment Medication management Current Medications:  Current Facility-Administered Medications  Medication Dose Route Frequency Provider Last Rate Last Dose  . acetaminophen (TYLENOL) tablet 650 mg  650 mg Oral Q6H PRN Fransisca Kaufmann, NP   650 mg at 10/09/13 0750  . alum & mag hydroxide-simeth (MAALOX/MYLANTA) 200-200-20 MG/5ML suspension 30 mL  30 mL Oral Q4H PRN Fransisca Kaufmann, NP      . ARIPiprazole (ABILIFY) tablet 5 mg  5 mg Oral Daily Fransisca Kaufmann, NP   5 mg at 10/09/13 0747  . gabapentin (NEURONTIN) capsule 100 mg  100 mg Oral BID Fransisca Kaufmann, NP   100 mg at 10/09/13 0747  . insulin aspart (novoLOG) injection 0-15 Units  0-15 Units Subcutaneous TID WC Fransisca Kaufmann, NP   2 Units at 10/09/13 1143  . insulin aspart (novoLOG) injection 0-5 Units  0-5 Units Subcutaneous QHS Fransisca Kaufmann, NP      . insulin aspart (novoLOG) injection 4 Units  4 Units Subcutaneous TID WC Fransisca Kaufmann, NP   4 Units at 10/09/13 1144  . lisinopril (PRINIVIL,ZESTRIL) tablet 20 mg  20 mg Oral Daily Fransisca Kaufmann, NP   20 mg at 10/09/13 0747  . magnesium hydroxide (MILK OF MAGNESIA) suspension 30 mL  30 mL Oral Daily PRN Fransisca Kaufmann, NP      . metFORMIN (GLUCOPHAGE) tablet 500 mg  500 mg Oral BID WC Fransisca Kaufmann, NP   500 mg at 10/09/13 0747  . nicotine (NICODERM CQ -  dosed in mg/24 hours) patch 21 mg  21 mg Transdermal Daily Cruzito Standre   21 mg at 10/09/13 1121  . traZODone (DESYREL) tablet 100 mg  100 mg Oral QHS PRN Fransisca Kaufmann, NP   100 mg at 10/08/13 2124  .  trihexyphenidyl (ARTANE) tablet 5 mg  5 mg Oral BID WC Fransisca Kaufmann, NP   5 mg at 10/09/13 0747    Observation Level/Precautions:  rouitne  Laboratory:  rouitne  Psychotherapy:    Medications:  As above  Consultations:    Discharge Concerns:    Estimated LOS: 7-10 days  Other:     I certify that inpatient services furnished can reasonably be expected to improve the patient's condition.   Thedore Mins, MD 4/11/201512:29 PM

## 2013-10-10 LAB — GLUCOSE, CAPILLARY
Glucose-Capillary: 118 mg/dL — ABNORMAL HIGH (ref 70–99)
Glucose-Capillary: 132 mg/dL — ABNORMAL HIGH (ref 70–99)
Glucose-Capillary: 113 mg/dL — ABNORMAL HIGH (ref 70–99)
Glucose-Capillary: 123 mg/dL — ABNORMAL HIGH (ref 70–99)

## 2013-10-10 MED ORDER — TRAZODONE HCL 150 MG PO TABS
150.0000 mg | ORAL_TABLET | Freq: Every day | ORAL | Status: DC
  Administered 2013-10-10: 150 mg via ORAL
  Filled 2013-10-10 (×3): qty 1

## 2013-10-10 NOTE — Progress Notes (Signed)
Patient ID: [name], male   DOB: 12/07/1960, 53 y.o.   MRN: [id number] Psychoeducational Group Note  Date:  10/10/2013 Time:  0900am  Group Topic/Focus:  Making Healthy Choices:   The focus of this group is to help patients identify negative/unhealthy choices they were using prior to admission and identify positive/healthier coping strategies to replace them upon discharge.  Participation Level:  Active  Participation Quality:  Appropriate  Affect:  Excited  Cognitive:  Appropriate  Insight:  Supportive  Engagement in Group:  Supportive  Additional Comments:  Inventory group   [name], Rickell Wiehe Brooks 10/10/2013,9:44 AM

## 2013-10-10 NOTE — [facility] Group Notes (Signed)
[facility] LCSW Group Therapy Note 10/10/2013 / 11 AM  Type of Therapy and Topic:  Group Therapy: Avoiding Self-Sabotaging and Enabling Behaviors  Participation Level:  Actively sharing yet unengaged  Description of Group:     Learn how to identify obstacles, self-sabotaging and enabling behaviors, what are they, why do we do them and what needs do these behaviors meet? Discuss unhealthy relationships and how to have positive healthy boundaries with those that sabotage and enable. Explore aspects of self-sabotage and enabling in yourself and how to limit these self-destructive behaviors in everyday life.  Therapeutic Goals: 1. Patient will identify one obstacle that relates to self-sabotage and enabling behaviors 2. Patient will identify one personal self-sabotaging or enabling behavior they did prior to admission 3. Patient will demonstrate ability to communicate their needs through discussion and/or role plays.   Summary of Patient Progress: The main focus of today's process group was to explain to the adolescent what "self-sabotage" means and use Motivational Interviewing to discuss what benefits, negative or positive, were involved in a self-identified self-sabotaging behavior. We then talked about reasons the patient may want to change the behavior and her current desire to change. Pt's were given opportunity to identify their self sabotaging behaviors and identify where they are in cycle of change. 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 Patient reports then he will be able to obtain housing.    Therapeutic Modalities:   Cognitive Behavioral Therapy Person-Centered Therapy Motivational Interviewing   [name], LCSW

## 2013-10-10 NOTE — Progress Notes (Signed)
Patient ID: [name], male   DOB: 10/29/1960, 53 y.o.   MRN: [id number] Psychoeducational Group Note  Date:  10/10/2013 Time:  0930am  Group Topic/Focus:  Making Healthy Choices:   The focus of this group is to help patients identify negative/unhealthy choices they were using prior to admission and identify positive/healthier coping strategies to replace them upon discharge.  Participation Level:  Active  Participation Quality:  Appropriate  Affect:  Excited  Cognitive:  Appropriate  Insight:  Supportive  Engagement in Group:  Supportive  Additional Comments:  Healthy support group   [name], [name] 10/10/2013,9:44 AM

## 2013-10-10 NOTE — Progress Notes (Signed)
Patient ID: [name], male   DOB: Jul 30, 1960, 53 y.o.   MRN: [id number] D. The patient is very pleasant and interacting in the milieu. He is very proud of himself and looks for praise from staff regarding his new found "maturity". His hygiene has improved and he is attending all of his groups. Reports he plans on attending long term treatment to help refrain from using crack or other illegal substances. A. Met with patient to assess. Verbal praise and support given. Encouraged to attend evening group. Administered HS medication. Reviewed Insulin and healthy food choices. R. Attended and actively participated in evening group. Very receptive to praise. Compliant with medications. Seems to have a better understanding of his dietary restrictions and is able to give examples of healthy choices and foods to avoid on his diet.

## 2013-10-10 NOTE — Progress Notes (Signed)
Patient ID: Taylor DouglasFredrick L Monnin, male   DOB: Oct 30, 1960, 53 y.o.   MRN: [id number] 10-10-13 nursing shift note: D: pt has been pleasant and visible in the milieu. He is taking his medications and going to groups. A: when asked by staff he had not voiced any complaints and no pain. His b/p is elevated and he is receiving clonidine to address the b/p. R: on his inventory sheet he wrote: sleep poor, appetite improving, energy high, attention improving with his depression at 3 and hopelessness at 2. W/d symptoms have been chilling. He denied any si/hi/av presently. After discharge he plans to " exercise and take med's". RN will monitor and Q 15 min ck's continue.

## 2013-10-10 NOTE — Progress Notes (Signed)
Patient ID: [name], male   DOB: 06-Apr-1961, 53 y.o.   MRN: [id number] D. The patient is very bright and social. His speech is rapid and pressured. He shared that he was very proud of himself for taking this hospitalization seriously and has attended all of the groups. Prior admissions he chose to sleep and isolate in his room. Stated that his sister promised to buy hima watch and a car if he cleaned up and stayed off the drugs. A. Met with the patient to assess. Encouraged to attend evening group. Verbal support provided. Reviewed and administered HS medication. CGB monitored. R. The patient denied any a/v hallucinations. Denied suicidal ideation. Attended and actively participated in group. Compliant with medication. No sliding scale insulin bedtime coverage needed. CBG= 133.

## 2013-10-10 NOTE — Progress Notes (Signed)
Adult Psychoeducational Group Note  Date:  10/10/2013 Time:  9:04 PM  Group Topic/Focus:  Wrap-Up Group:   The focus of this group is to help patients review their daily goal of treatment and discuss progress on daily workbooks.  Participation Level:  Active  Participation Quality:  Appropriate  Affect:  Appropriate  Cognitive:  Appropriate  Insight: Appropriate  Engagement in Group:  Engaged  Modes of Intervention:  Discussion  Additional Comments: The patient expressed  that he made a healthy decision to stop using drugs.The patient said that he feels drugs will destroy his life.  Laneta Simmersrthur L Awais Cobarrubias 10/10/2013, 9:04 PM

## 2013-10-10 NOTE — Progress Notes (Signed)
[facility] MD Progress Note  10/10/2013 1:07 PM [name]  MRN:  [id number] Subjective:" I have good news for you, my GI bills has just been approved, I will soon be heading to college.'' Objective: Patient is seen and chart is reviewed. Patient has no insight into his mental illness, his thought process remains bizarre and disorganized. He remains delusional, irritable and psychotic. He is reporting hearing voices telling him to hurt himself. He is requesting to get Abilify shot before he is discharged. He also report that he was recently charged for trespassing.  Diagnosis:   DSM5: Schizophrenia Disorders:  Delusional Disorder (297.1), Psychotic Disorder (298.8) and Schizophrenia (295.7) Obsessive-Compulsive Disorders:   Trauma-Stressor Disorders:   Substance/Addictive Disorders:  Cocaine use disorder Depressive Disorders:  Disruptive Mood Dysregulation Disorder (296.99) Total Time spent with patient: 25 minutes  Axis I: Paranoid schizophrenia, chronic condition           Cocaine use disorder Axis II: Cluster B Traits Axis III:  Past Medical History  Diagnosis Date  . Diabetes mellitus without complication   . Hypertension   . CHF (congestive heart failure)    Axis IV: other psychosocial or environmental problems, problems related to legal system/crime and problems related to social environment  ADL's:  Intact  Sleep: poor  Appetite:  Fair  Suicidal Ideation: yes Plan:  denies Intent:  denies Means:  denies Homicidal Ideation:  denies AEB (as evidenced by):  Psychiatric Specialty Exam: Physical Exam  Psychiatric: His mood appears anxious. His speech is rapid and/or pressured. He is agitated, aggressive and actively hallucinating. Cognition and memory are normal. He expresses impulsivity. He expresses suicidal ideation.    Review of Systems  Constitutional: Negative.   HENT: Negative.   Eyes: Negative.   Respiratory: Negative.   Cardiovascular: Negative.    Gastrointestinal: Negative.   Genitourinary: Negative.   Musculoskeletal: Negative.   Skin: Negative.   Endo/Heme/Allergies: Negative.   Psychiatric/Behavioral: Positive for suicidal ideas, hallucinations and substance abuse. The patient is nervous/anxious and has insomnia.     Blood pressure 177/90, pulse 70, temperature 98.4 F (36.9 C), temperature source Oral, resp. rate 16, height 5\' 9"  (1.753 m), weight 77.111 kg (170 lb), SpO2 100.00%.Body mass index is 25.09 kg/(m^2).  General Appearance: Disheveled  Eye Contact::  Minimal  Speech:  Pressured  Volume:  Increased  Mood:  Anxious and Irritable  Affect:  Labile and Full Range  Thought Process:  Circumstantial and Disorganized  Orientation:  Full (Time, Place, and Person)  Thought Content:  Delusions and Hallucinations: Auditory  Suicidal Thoughts:  Yes.  without intent/plan  Homicidal Thoughts:  No  Memory:  Immediate;   Fair Recent;   Fair Remote;   Fair  Judgement:  Poor  Insight:  Lacking  Psychomotor Activity:  Increased  Concentration:  Poor  Recall:  [employer] of Knowledge:Fair  Language: Fair  Akathisia:  No  Handed:  Right  AIMS (if indicated):     Assets:  Communication Skills Desire for Improvement Physical Health  Sleep:  Number of Hours: 2   Musculoskeletal: Strength & Muscle Tone: within normal limits Gait & Station: normal Patient leans: N/A  Current Medications: Current Facility-Administered Medications  Medication Dose Route Frequency Provider Last Rate Last Dose  . acetaminophen (TYLENOL) tablet 650 mg  650 mg Oral Q6H PRN Fransisca Kaufmann, NP   650 mg at 10/10/13 1048  . alum & mag hydroxide-simeth (MAALOX/MYLANTA) 200-200-20 MG/5ML suspension 30 mL  30 mL Oral Q4H PRN Vernona Rieger  Earlene Plater, NP      . ARIPiprazole (ABILIFY) tablet 5 mg  5 mg Oral BID PC Meshawn Oconnor   5 mg at 10/10/13 0742  . cloNIDine (CATAPRES) tablet 0.1 mg  0.1 mg Oral BID [place] Sarinana   0.1 mg at 10/10/13 0742  . gabapentin  (NEURONTIN) capsule 100 mg  100 mg Oral BID Fransisca Kaufmann, NP   100 mg at 10/10/13 0742  . insulin aspart (novoLOG) injection 0-15 Units  0-15 Units Subcutaneous TID WC Fransisca Kaufmann, NP   2 Units at 10/10/13 1143  . insulin aspart (novoLOG) injection 0-5 Units  0-5 Units Subcutaneous QHS Fransisca Kaufmann, NP      . insulin aspart (novoLOG) injection 4 Units  4 Units Subcutaneous TID WC Fransisca Kaufmann, NP   4 Units at 10/10/13 1144  . lisinopril (PRINIVIL,ZESTRIL) tablet 20 mg  20 mg Oral Daily Fransisca Kaufmann, NP   20 mg at 10/10/13 1610  . magnesium hydroxide (MILK OF MAGNESIA) suspension 30 mL  30 mL Oral Daily PRN Fransisca Kaufmann, NP      . metFORMIN (GLUCOPHAGE) tablet 500 mg  500 mg Oral BID WC Fransisca Kaufmann, NP   500 mg at 10/10/13 0743  . nicotine (NICODERM CQ - dosed in mg/24 hours) patch 21 mg  21 mg Transdermal Daily Yancy Hascall   21 mg at 10/10/13 0741  . traZODone (DESYREL) tablet 150 mg  150 mg Oral QHS Kaydin Karbowski      . trihexyphenidyl (ARTANE) tablet 5 mg  5 mg Oral BID WC Fransisca Kaufmann, NP   5 mg at 10/10/13 9604    Lab Results:  Results for orders placed during the hospital encounter of 10/08/13 (from the past 48 hour(s))  GLUCOSE, CAPILLARY     Status: Abnormal   Collection Time    10/08/13  4:53 PM      Result Value Ref Range   Glucose-Capillary 149 (*) 70 - 99 mg/dL  GLUCOSE, CAPILLARY     Status: Abnormal   Collection Time    10/08/13  8:57 PM      Result Value Ref Range   Glucose-Capillary 133 (*) 70 - 99 mg/dL  GLUCOSE, CAPILLARY     Status: Abnormal   Collection Time    10/09/13  5:59 AM      Result Value Ref Range   Glucose-Capillary 133 (*) 70 - 99 mg/dL   Comment 1 Notify RN     Comment 2 Documented in Chart    GLUCOSE, CAPILLARY     Status: Abnormal   Collection Time    10/09/13 11:41 AM      Result Value Ref Range   Glucose-Capillary 131 (*) 70 - 99 mg/dL  GLUCOSE, CAPILLARY     Status: Abnormal   Collection Time    10/09/13  5:00 PM      Result Value Ref Range    Glucose-Capillary 151 (*) 70 - 99 mg/dL  GLUCOSE, CAPILLARY     Status: Abnormal   Collection Time    10/09/13  9:16 PM      Result Value Ref Range   Glucose-Capillary 137 (*) 70 - 99 mg/dL  GLUCOSE, CAPILLARY     Status: Abnormal   Collection Time    10/10/13  5:43 AM      Result Value Ref Range   Glucose-Capillary 118 (*) 70 - 99 mg/dL  GLUCOSE, CAPILLARY     Status: Abnormal   Collection Time    10/10/13 11:38 AM  Result Value Ref Range   Glucose-Capillary 132 (*) 70 - 99 mg/dL    Physical Findings: AIMS: Facial and Oral Movements Muscles of Facial Expression: None, normal Lips and Perioral Area: None, normal Jaw: None, normal Tongue: None, normal,Extremity Movements Upper (arms, wrists, hands, fingers): None, normal Lower (legs, knees, ankles, toes): None, normal, Trunk Movements Neck, shoulders, hips: None, normal, Overall Severity Severity of abnormal movements (highest score from questions above): None, normal Incapacitation due to abnormal movements: None, normal Patient's awareness of abnormal movements (rate only patient's report): No Awareness, Dental Status Current problems with teeth and/or dentures?: No Does patient usually wear dentures?: No  CIWA:    COWS:     Treatment Plan Summary: Daily contact with patient to assess and evaluate symptoms and progress in treatment Medication management  Plan: 1. Admit for crisis management and stabilization.  2. Medication management to reduce current symptoms to base line and improve the patient's overall level of functioning :  -Continue Abilify to 5mg  bid for psychosis/delusions/mood  -Continue Artane 5mg  bid for EPS prevention  -Increase Trazodone to 150mg  Qhs for insomnia. 3. Treat health problems as indicated. Restart medications to address chronic health problems to include Lisinopril 20 mg daily for hypertension, Metformin 500 mg BID for diabetes along with Novolog SSI and 4 units of meal coverage.  Neurontin 100 mg BID for neuropathic pain/improved mood stability.  4. Develop treatment plan to decrease risk of relapse upon discharge and to reduce the need for readmission.  5. Psycho-social education regarding relapse prevention and self care.  6. Health care follow up as needed for medical problems.   Medical Decision Making Problem Points:  Established problem, worsening (2), Review of last therapy session (1) and Review of psycho-social stressors (1) Data Points:  Order Aims Assessment (2) Review or order clinical lab tests (1) Review of medication regiment & side effects (2) Review of new medications or change in dosage (2)  I certify that inpatient services furnished can reasonably be expected to improve the patient's condition.   Thedore MinsMojeed Sareen Randon, MD 10/10/2013, 1:07 PM

## 2013-10-11 LAB — GLUCOSE, CAPILLARY
GLUCOSE-CAPILLARY: 135 mg/dL — AB (ref 70–99)
GLUCOSE-CAPILLARY: 141 mg/dL — AB (ref 70–99)
GLUCOSE-CAPILLARY: 125 mg/dL — AB (ref 70–99)
Glucose-Capillary: 129 mg/dL — ABNORMAL HIGH (ref 70–99)

## 2013-10-11 MED ORDER — DIPHENHYDRAMINE HCL 50 MG/ML IJ SOLN
50.0000 mg | Freq: Once | INTRAMUSCULAR | Status: AC
  Administered 2013-10-11: 50 mg via INTRAMUSCULAR
  Filled 2013-10-11: qty 1

## 2013-10-11 MED ORDER — NAPROXEN 375 MG PO TABS
375.0000 mg | ORAL_TABLET | Freq: Two times a day (BID) | ORAL | Status: DC
  Administered 2013-10-11 – 2013-10-13 (×4): 375 mg via ORAL
  Filled 2013-10-11 (×7): qty 1

## 2013-10-11 MED ORDER — CLONIDINE HCL 0.2 MG PO TABS
0.2000 mg | ORAL_TABLET | Freq: Two times a day (BID) | ORAL | Status: DC
  Administered 2013-10-11 – 2013-10-13 (×4): 0.2 mg via ORAL
  Filled 2013-10-11 (×6): qty 1

## 2013-10-11 MED ORDER — TRAZODONE HCL 100 MG PO TABS
200.0000 mg | ORAL_TABLET | Freq: Every day | ORAL | Status: DC
  Administered 2013-10-11: 200 mg via ORAL
  Filled 2013-10-11 (×3): qty 2

## 2013-10-11 MED ORDER — ARIPIPRAZOLE ER 400 MG IM SUSR
400.0000 mg | Freq: Once | INTRAMUSCULAR | Status: AC
Start: 2013-10-11 — End: 2013-10-11
  Administered 2013-10-11: 400 mg via INTRAMUSCULAR

## 2013-10-11 MED ORDER — GABAPENTIN 100 MG PO CAPS
200.0000 mg | ORAL_CAPSULE | Freq: Two times a day (BID) | ORAL | Status: DC
  Administered 2013-10-11 – 2013-10-13 (×4): 200 mg via ORAL
  Filled 2013-10-11 (×6): qty 2

## 2013-10-11 NOTE — [facility] Group Notes (Signed)
[facility] Note   10/11/2013 8:45 AM  Participation Quality:  Alert, Appropriate and Oriented  Mood/Affect:  Manic  Depression Rating:  Pt denies  Anxiety Rating:  Pt denies  Thoughts of Suicide:  Pt denies SI/HI  Will you contract for safety?   Yes  Current AVH:  Pt denies  Plan for Discharge/Comments:  Pt attended discharge planning group and actively participated in group.  CSW provided pt with today's workbook.  Pt reports feeling "excellent" today.  Pt states that [place] in [place] but wants to go for further inpatient treatment after here.  Pt states that cocaine has taken over his life.  Pt states that [facility] and left after 6 days.  Pt asks about RTS as an option. CSW will assess for appropriate referrals.  Pt asked CSW to contact his probation officer to notify her that he is in the hospital - CSW spoke with probation officer this morning.  Pt is hyperverbal throughout group but pleasant and easily redirectable.  No further needs voiced by pt at this time.    Transportation Means: Pt reports access to transportation  Supports: No supports mentioned at this time  Taylor IvanChelsea Horton, LCSW 10/11/2013 10:30 AM

## 2013-10-11 NOTE — Progress Notes (Signed)
D   Pt is irritable on approach   He admits to off and on suicidal thoughts   He request something for pain and said the doctor discontinued his tylenol today and ordered an new pain medication    A   Verbal support given   Medications administered and effectiveness monitored  Q 15 min checks R   Pt safe at present

## 2013-10-11 NOTE — Progress Notes (Signed)
D: Pt appears anxious this morning. Pt presents hyperactive, rapid pressured and impulsive. Pt has flight of ideas, silly, attention seeking and childlike. Pt has intermittent AVH/SI. Pt verbally contracts for safety. Pt stated to writer that he is trying to get his life together and stay off of "crack". Pt stated he's going to save his money so he can buy a cadillac car and a rolex watch. Pt is pleasant and cooperative. Pt compliant with taking meds and attending groups. A: Medications administered as ordered per MD. Verbal support given. Pt encouraged to attend groups. 15 minute checks performed for safety. R: Pt safety maintained at this time.

## 2013-10-11 NOTE — Plan of Care (Signed)
[facility] Crisis Plan  Reason for Crisis Plan:  Chronic Mental Illness/Medical Illness and Substance Abuse   Plan of Care:  Further inpatient treatment from the ED or outpatient treatment  Family Support:    [name] - cousin - [phone]  Current Living Environment:  Living Arrangements: Other relatives  Insurance:   Hospital Account   Name Acct ID Class Status Primary Coverage   Vigil, [name] [id number] [facility] Inpatient Special Open MEDICARE - MEDICARE PART A AND B        Guarantor Account (for Hospital Account #[id number])   Name Relation to Pt Service Area Active? Acct Type   Spaid, [name] Self [facility] Yes Behavioral Health   Address Phone       [address] [place], [place] [address] [phone](H)          Coverage Information (for Hospital Account #[id number])   F/O Payor/Plan Precert #   MEDICARE/MEDICARE PART A AND B    Subscriber Subscriber #   Linford, [name] [id number]   Address Phone   [address] [place], [place] [address]       Legal Guardian:    Self  Primary Care Provider:  No PCP Per Patient  Current Outpatient Providers:  [name]  Psychiatrist:     Counselor/Therapist:     Compliant with Medications:  No  Additional Information: Pt has been hospitalized numerous times here. Pt is consistently non compliant with medication when he discharges from [facility].  Pt chronically uses crack cocaine and gets arrested, resulting in him wanting to come here.  Unable to make many referrals to substance abuse treatment centers due to pt's frequent admissions there as well. Pt has met therapeutic benefit at [facility].      N Horton 4/13/20152:29 PM 

## 2013-10-11 NOTE — [facility] Suicide Risk Assessment (Signed)
[facility] INPATIENT:  Family/Significant Other Suicide Prevention Education  Suicide Prevention Education:  Education Completed; [name] - cousin [phone]),  (name of family member/significant other) has been identified by the patient as the family member/significant other with whom the patient will be residing, and identified as the person(s) who will aid the patient in the event of a mental health crisis (suicidal ideations/suicide attempt).  With written consent from the patient, the family member/significant other has been provided the following suicide prevention education, prior to the and/or following the discharge of the patient.  The suicide prevention education provided includes the following:  Suicide risk factors  Suicide prevention and interventions  National Suicide Hotline telephone number  [facility] assessment telephone number  [facility] Emergency Assistance 911  [facility] and/or Residential Mobile Crisis Unit telephone number  Request made of family/significant other to:  Remove weapons (e.g., guns, rifles, knives), all items previously/currently identified as safety concern.    Remove drugs/medications (over-the-counter, prescriptions, illicit drugs), all items previously/currently identified as a safety concern.  The family member/significant other verbalizes understanding of the suicide prevention education information provided.  The family member/significant other agrees to remove the items of safety concern listed above. Cousin states that pt really needs further substance abuse treatment and this is her only concern.    [name] N Horton 10/11/2013, 9:58 AM

## 2013-10-11 NOTE — Progress Notes (Signed)
The focus of this group is to help patients review their daily goal of treatment and discuss progress on daily workbooks. Pt attended the evening group session and responded to all discussion prompts from the Writer. Pt shared that today was a good day on the unit, the highlight of which was a good visit from his probation officer. Pt shared that upon discharge he wants to become a comedian and start dressing like a gigalo. Pt's responses to prompts were mostly immature, such as when he announced that he wanted to change his name to "Homey the Clown." When asked how he looks when well, Pt shared "I'm well now!"

## 2013-10-11 NOTE — [facility] Group Notes (Signed)
[facility] LCSW Group Therapy  10/11/2013   1:15 PM   Type of Therapy:  Group Therapy  Participation Level:  Active  Participation Quality:  Attentive, Sharing and Supportive  Affect:  Bright  Cognitive:  Alert and Oriented  Insight:  Developing/Improving and Engaged  Engagement in Therapy:  Developing/Improving and Engaged  Modes of Intervention:  Clarification, Confrontation, Discussion, Education, Exploration, Limit-setting, Orientation, Problem-solving, Rapport Building, Dance movement psychotherapisteality Testing, Socialization and Support  Summary of Progress/Problems: Pt identified obstacles faced currently and processed barriers involved in overcoming these obstacles. Pt identified steps necessary for overcoming these obstacles and explored motivation (internal and external) for facing these difficulties head on. Pt further identified one area of concern in their lives and chose a goal to focus on for today.  Pt shared that his biggest obstacle is his drug use as it prevents him from being clean and sober and getting a wife.  Pt than got off topic, discussing what he wants in a wife and how he will get one.  With redirection, pt discussed the need to follow through with meetings to prevent relapse.  Pt is very talkative and needs redirection but actively participated in group discussion.    Taylor IvanChelsea Horton, LCSW 10/11/2013  3:24 PM

## 2013-10-11 NOTE — Progress Notes (Signed)
Patient ID: [name], male   DOB: 1960-10-18, 53 y.o.   MRN: [id number] [facility]  10/11/2013 10:19 AM [name]  MRN:  [id number] Subjective:" I am still hearing voices, having trouble sleeping and feeling suicidal. I promise you I will stay away from cocaine.'' Objective: Patient is seen and chart is reviewed. Patient continues to endorse suicidal thoughts, hearing voices and feeling irritable. His thought process remains bizarre and disorganized. He has little or no insight into his mental illness. He has a fixed delusions that he served in [employer] and states that he just got his GI bill that will help him go to college. He is denying cravings for cocaine and other drugs. Patient is compliant his medications and has not verbalized adverse reactions.  Diagnosis:   DSM5: Schizophrenia Disorders:  Delusional Disorder (297.1), Psychotic Disorder (298.8) and Schizophrenia (295.7) Obsessive-Compulsive Disorders:   Trauma-Stressor Disorders:   Substance/Addictive Disorders:  Cocaine use disorder Depressive Disorders:  Disruptive Mood Dysregulation Disorder (296.99) Total Time spent with patient: 25 minutes  Axis I: Paranoid schizophrenia, chronic condition           Cocaine use disorder Axis II: Cluster B Traits Axis III:  Past Medical History  Diagnosis Date  . Diabetes mellitus without complication   . Hypertension   . CHF (congestive heart failure)    Axis IV: other psychosocial or environmental problems, problems related to legal system/crime and problems related to social environment  ADL's:  Intact  Sleep: poor  Appetite:  Fair  Suicidal Ideation: yes Plan:  denies Intent:  denies Means:  denies Homicidal Ideation:  denies AEB (as evidenced by):  Psychiatric Specialty Exam: Physical Exam  Psychiatric: His mood appears anxious. His speech is rapid and/or pressured. He is agitated, aggressive and actively hallucinating. Cognition and memory  are normal. He expresses impulsivity. He expresses suicidal ideation.    Review of Systems  Constitutional: Negative.   HENT: Negative.   Eyes: Negative.   Respiratory: Negative.   Cardiovascular: Negative.   Gastrointestinal: Negative.   Genitourinary: Negative.   Musculoskeletal: Negative.   Skin: Negative.   Endo/Heme/Allergies: Negative.   Psychiatric/Behavioral: Positive for suicidal ideas, hallucinations and substance abuse. The patient is nervous/anxious and has insomnia.     Blood pressure 155/88, pulse 77, temperature 97.8 F (36.6 C), temperature source Oral, resp. rate 18, height 5\' 9"  (1.753 m), weight 77.111 kg (170 lb), SpO2 100.00%.Body mass index is 25.09 kg/(m^2).  General Appearance: Disheveled  Eye Contact::  Minimal  Speech:  Pressured  Volume:  Increased  Mood:  Anxious and Irritable  Affect:  Labile and Full Range  Thought Process:  Circumstantial and Disorganized  Orientation:  Full (Time, Place, and Person)  Thought Content:  Delusions and Hallucinations: Auditory  Suicidal Thoughts:  Yes.  without intent/plan  Homicidal Thoughts:  No  Memory:  Immediate;   Fair Recent;   Fair Remote;   Fair  Judgement:  Poor  Insight:  Lacking  Psychomotor Activity:  Increased  Concentration:  Poor  Recall:  [employer]Fair  Fund of Knowledge:Fair  Language: Fair  Akathisia:  No  Handed:  Right  AIMS (if indicated):     Assets:  Communication Skills Desire for Improvement Physical Health  Sleep:  Number of Hours: 2.5   Musculoskeletal: Strength & Muscle Tone: within normal limits Gait & Station: normal Patient leans: N/A  Current Medications: Current Facility-Administered Medications  Medication Dose Route Frequency Provider Last Rate Last Dose  . acetaminophen (TYLENOL)  tablet 650 mg  650 mg Oral Q6H PRN Fransisca KaufmannLaura Davis, NP   650 mg at 10/11/13 0600  . alum & mag hydroxide-simeth (MAALOX/MYLANTA) 200-200-20 MG/5ML suspension 30 mL  30 mL Oral Q4H PRN Fransisca KaufmannLaura Davis, NP       . ARIPiprazole (ABILIFY) tablet 5 mg  5 mg Oral BID PC Melita Villalona   5 mg at 10/11/13 0804  . cloNIDine (CATAPRES) tablet 0.2 mg  0.2 mg Oral BID Denali Sharma      . gabapentin (NEURONTIN) capsule 100 mg  100 mg Oral BID Fransisca KaufmannLaura Davis, NP   100 mg at 10/11/13 0804  . insulin aspart (novoLOG) injection 0-15 Units  0-15 Units Subcutaneous TID WC Fransisca KaufmannLaura Davis, NP   2 Units at 10/11/13 [phone]  . insulin aspart (novoLOG) injection 0-5 Units  0-5 Units Subcutaneous QHS Fransisca KaufmannLaura Davis, NP      . insulin aspart (novoLOG) injection 4 Units  4 Units Subcutaneous TID WC Fransisca KaufmannLaura Davis, NP   4 Units at 10/11/13 0622  . lisinopril (PRINIVIL,ZESTRIL) tablet 20 mg  20 mg Oral Daily Fransisca KaufmannLaura Davis, NP   20 mg at 10/11/13 0804  . magnesium hydroxide (MILK OF MAGNESIA) suspension 30 mL  30 mL Oral Daily PRN Fransisca KaufmannLaura Davis, NP      . metFORMIN (GLUCOPHAGE) tablet 500 mg  500 mg Oral BID WC Fransisca KaufmannLaura Davis, NP   500 mg at 10/11/13 0804  . nicotine (NICODERM CQ - dosed in mg/24 hours) patch 21 mg  21 mg Transdermal Daily Marializ Ferrebee   21 mg at 10/11/13 0805  . traZODone (DESYREL) tablet 150 mg  150 mg Oral QHS Jerame Hedding   150 mg at 10/10/13 2149  . trihexyphenidyl (ARTANE) tablet 5 mg  5 mg Oral BID WC Fransisca KaufmannLaura Davis, NP   5 mg at 10/11/13 [id number]    Lab Results:  Results for orders placed during the hospital encounter of 10/08/13 (from the past 48 hour(s))  GLUCOSE, CAPILLARY     Status: Abnormal   Collection Time    10/09/13 11:41 AM      Result Value Ref Range   Glucose-Capillary 131 (*) 70 - 99 mg/dL  GLUCOSE, CAPILLARY     Status: Abnormal   Collection Time    10/09/13  5:00 PM      Result Value Ref Range   Glucose-Capillary 151 (*) 70 - 99 mg/dL  GLUCOSE, CAPILLARY     Status: Abnormal   Collection Time    10/09/13  9:16 PM      Result Value Ref Range   Glucose-Capillary 137 (*) 70 - 99 mg/dL  GLUCOSE, CAPILLARY     Status: Abnormal   Collection Time    10/10/13  5:43 AM      Result Value Ref Range    Glucose-Capillary 118 (*) 70 - 99 mg/dL  GLUCOSE, CAPILLARY     Status: Abnormal   Collection Time    10/10/13 11:38 AM      Result Value Ref Range   Glucose-Capillary 132 (*) 70 - 99 mg/dL  GLUCOSE, CAPILLARY     Status: Abnormal   Collection Time    10/10/13  5:03 PM      Result Value Ref Range   Glucose-Capillary 113 (*) 70 - 99 mg/dL  GLUCOSE, CAPILLARY     Status: Abnormal   Collection Time    10/10/13  9:21 PM      Result Value Ref Range   Glucose-Capillary 123 (*) 70 - 99 mg/dL  GLUCOSE, CAPILLARY     Status: Abnormal   Collection Time    10/11/13  5:49 AM      Result Value Ref Range   Glucose-Capillary 135 (*) 70 - 99 mg/dL    Physical Findings: AIMS: Facial and Oral Movements Muscles of Facial Expression: None, normal Lips and Perioral Area: None, normal Jaw: None, normal Tongue: None, normal,Extremity Movements Upper (arms, wrists, hands, fingers): None, normal Lower (legs, knees, ankles, toes): None, normal, Trunk Movements Neck, shoulders, hips: None, normal, Overall Severity Severity of abnormal movements (highest score from questions above): None, normal Incapacitation due to abnormal movements: None, normal Patient's awareness of abnormal movements (rate only patient's report): No Awareness, Dental Status Current problems with teeth and/or dentures?: No Does patient usually wear dentures?: No  CIWA:    COWS:     Treatment Plan Summary: Daily contact with patient to assess and evaluate symptoms and progress in treatment Medication management  Plan: 1. Admit for crisis management and stabilization.  2. Medication management to reduce current symptoms to base line and improve the patient's overall level of functioning :  -Continue Abilify to 5mg  bid for psychosis/delusions/mood  -Continue Artane 5mg  bid for EPS prevention  -Increase Trazodone to 200mg  Qhs for insomnia. -Increase Gabapentin to 200mg  bid for mood stabilization. -Add Abilify maintenna 400mg   IM q 28 days  For paranoid Schizophrenia/poor oral medication compliance. 3. Treat health problems as indicated. Restart medications to address chronic health problems to include Lisinopril 20 mg daily for hypertension, Metformin 500 mg BID for diabetes along with Novolog SSI and 4 units of meal coverage. Neurontin 100 mg BID for neuropathic pain/improved mood stability.  4. Develop treatment plan to decrease risk of relapse upon discharge and to reduce the need for readmission.  5. Psycho-social education regarding relapse prevention and self care.  6. Health care follow up as needed for medical problems.   Medical Decision Making Problem Points:  Established problem, improving (1), Review of last therapy session (1) and Review of psycho-social stressors (1) Data Points:  Order Aims Assessment (2) Review or order clinical lab tests (1) Review of medication regiment & side effects (2) Review of new medications or change in dosage (2)  I certify that inpatient services furnished can reasonably be expected to improve the patient's condition.   Thedore Mins, MD 10/11/2013, 10:19 AM

## 2013-10-11 NOTE — Clinical Social Work Note (Signed)
CSW spoke with pt's probation officer, Morley KosCourtney Jerell, [phone] at this time.  CSW informed Ms. Suzan GaribaldiJerell that pt is in the hospital.    Reyes IvanChelsea Horton, LCSW 10/11/2013  9:57 AM

## 2013-10-11 NOTE — Tx Team (Signed)
Interdisciplinary Treatment Plan Update (Adult)  Date: 10/11/2013  Time Reviewed:  9:45 AM  Progress in Treatment: Attending groups: Yes Participating in groups:  Yes Taking medication as prescribed:  Yes Tolerating medication:  Yes Family/Significant othe contact made: CSW assessing  Patient understands diagnosis:  Yes Discussing patient identified problems/goals with staff:  Yes Medical problems stabilized or resolved:  Yes Denies suicidal/homicidal ideation: Yes Issues/concerns per patient self-inventory:  Yes Other:  New problem(s) identified: N/A  Discharge Plan or Barriers: CSW assessing for appropriate referrals.  Reason for Continuation of Hospitalization: Anxiety Depression Medication Stabilization Psychosis  Comments: N/A  Estimated length of stay: 5-7 days  For review of initial/current patient goals, please see plan of care.  Attendees: Patient:     Family:     Physician:  Dr. Mojeed Akintayo 10/11/2013 11:16 AM   Nursing:   Linsey Squires, RN 10/11/2013 11:16 AM   Clinical Social Worker:  Harshan Kearley Horton, LCSW 10/11/2013 11:16 AM   Other: Patrice White, RN 10/11/2013 11:16 AM   Other:     Other:     Other:     Other:    Other:    Other:    Other:    Other:    Other:     Scribe for Treatment Team:   Horton, Khala Tarte Nicole, 10/11/2013 11:16 AM        

## 2013-10-12 DIAGNOSIS — F1410 Cocaine abuse, uncomplicated: Secondary | ICD-10-CM

## 2013-10-12 DIAGNOSIS — F200 Paranoid schizophrenia: Principal | ICD-10-CM

## 2013-10-12 LAB — GLUCOSE, CAPILLARY
Glucose-Capillary: 119 mg/dL — ABNORMAL HIGH (ref 70–99)
Glucose-Capillary: 120 mg/dL — ABNORMAL HIGH (ref 70–99)
Glucose-Capillary: 146 mg/dL — ABNORMAL HIGH (ref 70–99)
Glucose-Capillary: 149 mg/dL — ABNORMAL HIGH (ref 70–99)

## 2013-10-12 MED ORDER — ACETAMINOPHEN 325 MG PO TABS
650.0000 mg | ORAL_TABLET | Freq: Four times a day (QID) | ORAL | Status: DC | PRN
  Administered 2013-10-13: 650 mg via ORAL
  Filled 2013-10-12 (×2): qty 2

## 2013-10-12 MED ORDER — TRAZODONE HCL 100 MG PO TABS
100.0000 mg | ORAL_TABLET | Freq: Every day | ORAL | Status: DC
  Administered 2013-10-12: 100 mg via ORAL
  Filled 2013-10-12 (×2): qty 1

## 2013-10-12 NOTE — Progress Notes (Addendum)
Patient has asked to be referred for long term treatment for SA. He reports he recently left [facility] and cannot return.  He not willing to be referred to [facility]. LCSW discussed [facility] and patient was agreeable. Patient's referral was sent this morning and LCSW is awaiting to hear back if patient accepted.  Updated:  [facility] SA due to hallucinations.  Patient will be updated and per his request given a list of places for long term treatment.  [facility]. LCSW has contacted [facility] to see if patient can be welcomed back after leaving treatment early in the past.  Message left for admission. [place] [facility] [place] and will send referral for treatment.  Admissions reports they do have beds, however need to see referral first.  [facility] is faxing over their prescreen for admission.    Patient remains commanding and blunt with regards to his treatment. He is demanding of LCSW and ordering in regards to getting him into long term treatment.  He becomes angry when LCSW does not provide a list, thus LCSW provided list of all SA facilities.  Patient reports he cannot pay for long term treatment and will not pay.   Taylor JacobsHannah [name], MSW, LCSW Clinical Lead [phone]

## 2013-10-12 NOTE — [facility] Group Notes (Signed)
[facility] LCSW Group Therapy  10/12/2013 1:01 PM  Type of Therapy:  Group Therapy  Participation Level:  Active  Participation Quality:  Attentive  Affect:  Labile    Cognitive:  Alert and Oriented  Insight:  Developing/Improving  Engagement in Therapy:  Engaged  Modes of Intervention:  Discussion, Exploration and Problem-solving  Summary of Progress/Problems:  Group today consisted of processing good and bad habits and applying cause and effect with regards to outcomes.  Members were asked to process their daily activities categorizing them into good and bad and looking at the deeper meaning of why they respond and feel certain ways.  Merlyn AlbertFred was very tangential in group today and closed off to others responses.  His bad habits are smoking crack cocaine and abusing drugs.  He reports he does this because he has an addictive personality and struggles processing the cause (or reason for drug use).  He was redirected several times and reports he has pain in his feet and when he gets high he does not feel the pain.  He reports he enjoys the feeling of getting high and this leads him to seek more "rock" out.  One of his good habits is reading the Bible and being a spiritual man, helping him deal with people who are not spiritual and hurt his feelings.   Raye SorrowHannah N Sebastien Jackson 10/12/2013, 1:01 PM

## 2013-10-12 NOTE — Progress Notes (Signed)
Patient in his room witting at the beginning of this shift. Mood and affect flat and depressed. He said he will be of his best behavior tonight because he would be going home tomorrow. He denied SI/HI and denied Hallucinations. Patient refused Trazodone 200 mg at HS; he said the dosage was too much for him and he requested for a decrease in the dose. Writer notified [place] ; he authorized dosage change. Patient received Trazodone 100 mg at HS. Patient interacted well with peers and seemed excited about discharge tomorrow.

## 2013-10-12 NOTE — Progress Notes (Signed)
D: Pt presents anxious this morning. Pt childlike and silly. Pt reports decreased symptoms, pt reported no SI/HI/AVH during shift assessment this am. Pt reported he will be attending a 30 day long-term tx facility upon discharge. Pt stated that he is excited about attending tx because he wants to stop using "crack". Pt compliant with attending groups and taking meds. A: Medications administered as ordered per MD. verbal support given. Pt encouraged to attend groups. 15 minute checks performed for safety. R:  Pt safety maintained at this time.

## 2013-10-12 NOTE — Progress Notes (Signed)
Patient ID: [name], male   DOB: Oct 02, 1960, 53 y.o.   MRN: [id number] [facility]  10/12/2013 10:59 AM [name]  MRN:  [id number] Subjective: Patient states "I am not hearing voices as much. I really want to get off cocaine. I have been struggling since 1986. I have children and grand-children to live for. I felt too sedated from the Trazodone last night."   Objective: Patient is seen and chart is reviewed. Patient endorses decreased suicidal thoughts, hearing voices and feeling irritable. His thought process are less bizarre and disorganized. He continues to show poor insight into his mental illness.  Patient has been attending groups but making many immature comments such as planning to change his name to [facility] after d/c. [facility].  He is denying cravings for cocaine and other drugs. Patient is compliant his medications and has not verbalized adverse reactions.   Diagnosis:   DSM5: Schizophrenia Disorders:  Delusional Disorder (297.1), Psychotic Disorder (298.8) and Schizophrenia (295.7) Obsessive-Compulsive Disorders:   Trauma-Stressor Disorders:   Substance/Addictive Disorders:  Cocaine use disorder Depressive Disorders:  Disruptive Mood Dysregulation Disorder (296.99) Total Time spent with patient: 20 minutes  Axis I: Paranoid schizophrenia, chronic condition           Cocaine use disorder Axis II: Cluster B Traits Axis III:  Past Medical History  Diagnosis Date  . Diabetes mellitus without complication   . Hypertension   . CHF (congestive heart failure)    Axis IV: other psychosocial or environmental problems, problems related to legal system/crime and problems related to social environment  ADL's:  Intact  Sleep: Fair  Appetite:  Fair  Suicidal Ideation: No Plan:  denies Intent:  denies Means:  denies Homicidal Ideation: No denies AEB (as evidenced by):  Psychiatric  Specialty Exam: Physical Exam  Psychiatric: His mood appears anxious. His speech is rapid and/or pressured. He is agitated, aggressive and actively hallucinating. Cognition and memory are normal. He expresses impulsivity. He expresses suicidal ideation.    Review of Systems  Constitutional: Negative.   HENT: Negative.   Eyes: Negative.   Respiratory: Negative.   Cardiovascular: Negative.   Gastrointestinal: Negative.   Genitourinary: Negative.   Musculoskeletal: Negative.   Skin: Negative.   Endo/Heme/Allergies: Negative.   Psychiatric/Behavioral: Positive for substance abuse. Negative for depression, suicidal ideas and hallucinations. The patient is nervous/anxious. The patient does not have insomnia.     Blood pressure 132/74, pulse 70, temperature 97.6 F (36.4 C), temperature source Oral, resp. rate 20, height 5\' 9"  (1.753 m), weight 77.111 kg (170 lb), SpO2 100.00%.Body mass index is 25.09 kg/(m^2).  General Appearance: Disheveled  Eye SolicitorContact::  Fair  Speech:  Pressured  Volume:  Increased  Mood:  Anxious  Affect:  Labile and Full Range  Thought Process:  Circumstantial  Orientation:  Full (Time, Place, and Person)  Thought Content:  Delusions  Suicidal Thoughts:  No  Homicidal Thoughts:  No  Memory:  Immediate;   Fair Recent;   Fair Remote;   Fair  Judgement:  Poor  Insight:  Lacking  Psychomotor Activity:  Increased  Concentration:  Poor  Recall:  [employer]  Fund of Knowledge:Fair  Language: Fair  Akathisia:  No  Handed:  Right  AIMS (if indicated):     Assets:  Communication Skills Desire for Improvement Physical Health  Sleep:  Number of Hours: 5.25   Musculoskeletal: Strength & Muscle Tone: within normal  limits Gait & Station: normal Patient leans: N/A  Current Medications: Current Facility-Administered Medications  Medication Dose Route Frequency Provider Last Rate Last Dose  . alum & mag hydroxide-simeth (MAALOX/MYLANTA) 200-200-20 MG/5ML suspension 30  mL  30 mL Oral Q4H PRN Fransisca Kaufmann, NP      . ARIPiprazole (ABILIFY) tablet 5 mg  5 mg Oral BID PC Bentzion Dauria   5 mg at 10/12/13 0746  . cloNIDine (CATAPRES) tablet 0.2 mg  0.2 mg Oral BID Blaiden Werth   0.2 mg at 10/12/13 0746  . gabapentin (NEURONTIN) capsule 200 mg  200 mg Oral BID Isaias Dowson   200 mg at 10/12/13 0745  . insulin aspart (novoLOG) injection 0-15 Units  0-15 Units Subcutaneous TID WC Fransisca Kaufmann, NP   2 Units at 10/11/13 1716  . insulin aspart (novoLOG) injection 0-5 Units  0-5 Units Subcutaneous QHS Fransisca Kaufmann, NP      . insulin aspart (novoLOG) injection 4 Units  4 Units Subcutaneous TID WC Fransisca Kaufmann, NP   4 Units at 10/12/13 [phone]  . lisinopril (PRINIVIL,ZESTRIL) tablet 20 mg  20 mg Oral Daily Fransisca Kaufmann, NP   20 mg at 10/12/13 0800  . magnesium hydroxide (MILK OF MAGNESIA) suspension 30 mL  30 mL Oral Daily PRN Fransisca Kaufmann, NP      . metFORMIN (GLUCOPHAGE) tablet 500 mg  500 mg Oral BID WC Fransisca Kaufmann, NP   500 mg at 10/12/13 0746  . naproxen (NAPROSYN) tablet 375 mg  375 mg Oral BID WC Kermit Arnette   375 mg at 10/12/13 0745  . nicotine (NICODERM CQ - dosed in mg/24 hours) patch 21 mg  21 mg Transdermal Daily Takasha Vetere   21 mg at 10/12/13 0745  . traZODone (DESYREL) tablet 200 mg  200 mg Oral QHS Jeno Calleros   200 mg at 10/11/13 2110  . trihexyphenidyl (ARTANE) tablet 5 mg  5 mg Oral BID WC Fransisca Kaufmann, NP   5 mg at 10/12/13 9604    Lab Results:  Results for orders placed during the hospital encounter of 10/08/13 (from the past 48 hour(s))  GLUCOSE, CAPILLARY     Status: Abnormal   Collection Time    10/10/13 11:38 AM      Result Value Ref Range   Glucose-Capillary 132 (*) 70 - 99 mg/dL  GLUCOSE, CAPILLARY     Status: Abnormal   Collection Time    10/10/13  5:03 PM      Result Value Ref Range   Glucose-Capillary 113 (*) 70 - 99 mg/dL  GLUCOSE, CAPILLARY     Status: Abnormal   Collection Time    10/10/13  9:21 PM      Result Value Ref  Range   Glucose-Capillary 123 (*) 70 - 99 mg/dL  GLUCOSE, CAPILLARY     Status: Abnormal   Collection Time    10/11/13  5:49 AM      Result Value Ref Range   Glucose-Capillary 135 (*) 70 - 99 mg/dL  GLUCOSE, CAPILLARY     Status: Abnormal   Collection Time    10/11/13 11:55 AM      Result Value Ref Range   Glucose-Capillary 141 (*) 70 - 99 mg/dL   Comment 1 Documented in Chart     Comment 2 Notify RN    GLUCOSE, CAPILLARY     Status: Abnormal   Collection Time    10/11/13  4:54 PM      Result Value Ref Range  Glucose-Capillary 125 (*) 70 - 99 mg/dL   Comment 1 Documented in Chart     Comment 2 Notify RN    GLUCOSE, CAPILLARY     Status: Abnormal   Collection Time    10/11/13  8:34 PM      Result Value Ref Range   Glucose-Capillary 129 (*) 70 - 99 mg/dL   Comment 1 Notify RN    GLUCOSE, CAPILLARY     Status: Abnormal   Collection Time    10/12/13  6:13 AM      Result Value Ref Range   Glucose-Capillary 119 (*) 70 - 99 mg/dL   Comment 1 Notify RN      Physical Findings: AIMS: Facial and Oral Movements Muscles of Facial Expression: None, normal Lips and Perioral Area: None, normal Jaw: None, normal Tongue: None, normal,Extremity Movements Upper (arms, wrists, hands, fingers): None, normal Lower (legs, knees, ankles, toes): None, normal, Trunk Movements Neck, shoulders, hips: None, normal, Overall Severity Severity of abnormal movements (highest score from questions above): None, normal Incapacitation due to abnormal movements: None, normal Patient's awareness of abnormal movements (rate only patient's report): No Awareness, Dental Status Current problems with teeth and/or dentures?: No Does patient usually wear dentures?: No  CIWA:    COWS:     Treatment Plan Summary: Daily contact with patient to assess and evaluate symptoms and progress in treatment Medication management  Plan: 1. Continue crisis management and stabilization.  2. Medication management to  reduce current symptoms to base line and improve the patient's overall level of functioning :  -Continue Abilify to 5mg  bid for psychosis/delusions/mood  -Continue Artane 5mg  bid for EPS prevention  -Decrease Trazodone to 150 mg Qhs for insomnia due to complaints of sedation.  -Continue Gabapentin to 200mg  bid for mood stabilization. - Abilify maintenna 400mg  IM q 28 days for paranoid Schizophrenia/poor oral medication compliance with first dose received on 10/11/13.  3. Treat health problems as indicated. Restart medications to address chronic health problems to include Lisinopril 20 mg daily for hypertension, Metformin 500 mg BID for diabetes along with Novolog SSI and 4 units of meal coverage. Neurontin 100 mg BID for neuropathic pain/improved mood stability.  4. Develop treatment plan to decrease risk of relapse upon discharge and to reduce the need for readmission.  5. Psycho-social education regarding relapse prevention and self care.  6. Health care follow up as needed for medical problems. 7. Anticipate d/c tomorrow. Referral to [facility] completed by Child psychotherapistsocial worker.   Medical Decision Making Problem Points:  Established problem, improving (1), Review of last therapy session (1) and Review of psycho-social stressors (1) Data Points:  Order Aims Assessment (2) Review or order clinical lab tests (1) Review of medication regiment & side effects (2)  I certify that inpatient services furnished can reasonably be expected to improve the patient's condition.   Fransisca KaufmannLaura Davis, NP-C 10/12/2013, 10:59 AM Patient seen, evaluated and I agree with notes by Nurse Practitioner. Thedore MinsMojeed Keven Soucy, MD

## 2013-10-13 LAB — GLUCOSE, CAPILLARY: Glucose-Capillary: 172 mg/dL — ABNORMAL HIGH (ref 70–99)

## 2013-10-13 LAB — CBG MONITORING, ED: GLUCOSE-CAPILLARY: 103 mg/dL — AB (ref 70–99)

## 2013-10-13 MED ORDER — CLONIDINE HCL 0.2 MG PO TABS: 0.2000 mg | ORAL_TABLET | Freq: Two times a day (BID) | ORAL | Status: DC

## 2013-10-13 MED ORDER — ARIPIPRAZOLE ER 400 MG IM SUSR: 400.0000 mg | INTRAMUSCULAR | Status: DC

## 2013-10-13 MED ORDER — TRAZODONE HCL 100 MG PO TABS: 100.0000 mg | ORAL_TABLET | Freq: Every day | ORAL | Status: DC

## 2013-10-13 MED ORDER — TRIHEXYPHENIDYL HCL 5 MG PO TABS: 5.0000 mg | ORAL_TABLET | Freq: Two times a day (BID) | ORAL | Status: DC

## 2013-10-13 MED ORDER — METFORMIN HCL 500 MG PO TABS: 500.0000 mg | ORAL_TABLET | Freq: Two times a day (BID) | ORAL | Status: DC

## 2013-10-13 MED ORDER — LISINOPRIL 20 MG PO TABS: 20.0000 mg | ORAL_TABLET | Freq: Every day | ORAL | Status: DC

## 2013-10-13 MED ORDER — ARIPIPRAZOLE 5 MG PO TABS: 5.0000 mg | ORAL_TABLET | Freq: Two times a day (BID) | ORAL | Status: DC

## 2013-10-13 MED ORDER — NAPROXEN 375 MG PO TABS: 375.0000 mg | ORAL_TABLET | Freq: Two times a day (BID) | ORAL | Status: DC

## 2013-10-13 MED ORDER — GABAPENTIN 100 MG PO CAPS: 200.0000 mg | ORAL_CAPSULE | Freq: Two times a day (BID) | ORAL | Status: DC

## 2013-10-13 NOTE — [facility] Suicide Risk Assessment (Signed)
   Demographic Factors:  Male, Low socioeconomic status and Unemployed  Total Time spent with patient: 20 minutes  Psychiatric Specialty Exam: Physical Exam  Psychiatric: He has a normal mood and affect. His behavior is normal. Judgment and thought content normal. Cognition and memory are normal.    Review of Systems  Constitutional: Negative.   HENT: Negative.   Eyes: Negative.   Respiratory: Negative.   Cardiovascular: Negative.   Gastrointestinal: Negative.   Genitourinary: Negative.   Musculoskeletal: Negative.   Skin: Negative.   Neurological: Negative.   Endo/Heme/Allergies: Negative.   Psychiatric/Behavioral: Negative.     Blood pressure 148/74, pulse 76, temperature 97.9 F (36.6 C), temperature source Oral, resp. rate 18, height 5\' 9"  (1.753 m), weight 77.111 kg (170 lb), SpO2 100.00%.Body mass index is 25.09 kg/(m^2).  General Appearance: Casual  Eye Contact::  Good  Speech:  Clear and Coherent  Volume:  Normal  Mood:  Euthymic  Affect:  Appropriate  Thought Process:  Circumstantial  Orientation:  Full (Time, Place, and Person)  Thought Content:  Negative  Suicidal Thoughts:  No  Homicidal Thoughts:  No  Memory:  Immediate;   Fair Recent;   Fair Remote;   Fair  Judgement:  Fair  Insight:  Fair  Psychomotor Activity:  Normal  Concentration:  Fair  Recall:  [employer]Fair  Fund of Knowledge:Fair  Language: Fair  Akathisia:  No  Handed:  Right  AIMS (if indicated):     Assets:  Communication Skills Desire for Improvement  Sleep:  Number of Hours: 4.25    Musculoskeletal: Strength & Muscle Tone: within normal limits Gait & Station: normal Patient leans: N/A   Mental Status Per Nursing Assessment::   On Admission:     Current Mental Status by Physician: patient denies suicidal ideation, intent or plan  Loss Factors: Decrease in vocational status and Financial problems/change in socioeconomic status  Historical Factors: Impulsivity  Risk Reduction  Factors:   Positive social support  Continued Clinical Symptoms:  Alcohol/Substance Abuse/Dependencies  Cognitive Features That Contribute To Risk:  Closed-mindedness Polarized thinking    Suicide Risk:  Minimal: No identifiable suicidal ideation.  Patients presenting with no risk factors but with morbid ruminations; may be classified as minimal risk based on the severity of the depressive symptoms  Discharge Diagnoses:   AXIS I:  Paranoid schizophrenia, chronic condition              Cocaine use disorder AXIS II:  Cluster B Traits AXIS III:   Past Medical History  Diagnosis Date  . Diabetes mellitus without complication   . Hypertension   . Schizophrenia   . CHF (congestive heart failure)    AXIS IV:  other psychosocial or environmental problems, problems related to legal system/crime and problems related to social environment AXIS V:  61-70 mild symptoms  Plan Of Care/Follow-up recommendations:  Activity:  as tolerated Diet:  healthy Tests:  routine Other:  patient to keep his after care appointement  Is patient on multiple antipsychotic therapies at discharge:  No   Has Patient had three or more failed trials of antipsychotic monotherapy by history:  No  Recommended Plan for Multiple Antipsychotic Therapies: NA    Taylor MinsMojeed Angellynn Kimberlin, MD 10/13/2013, 10:12 AM

## 2013-10-13 NOTE — Discharge Summary (Signed)
Physician Discharge Summary Note  Patient:  [name] is an 53 y.o., male MRN:  [id number] DOB:  Nov 28, 1960 Patient phone:  [phone] (home)  Patient address:   [address] [place] [id number],  Total Time spent with patient: 20 minutes  Date of Admission:  10/08/2013 Date of Discharge: 10/13/13  Reason for Admission: Psychosis  Discharge Diagnoses: Principal Problem:   Paranoid schizophrenia, chronic condition Active Problems:   Schizophrenia   Cocaine dependence  Psychiatric Specialty Exam: Physical Exam  Psychiatric: He has a normal mood and affect. His speech is normal and behavior is normal. Judgment and thought content normal. Cognition and memory are normal.    Review of Systems  Constitutional: Negative.   HENT: Negative.   Eyes: Negative.   Respiratory: Negative.   Cardiovascular: Negative.   Gastrointestinal: Negative.   Genitourinary: Negative.   Musculoskeletal: Negative.   Skin: Negative.   Neurological: Negative.   Endo/Heme/Allergies: Negative.   Psychiatric/Behavioral: Negative.     Blood pressure 148/74, pulse 76, temperature 97.9 F (36.6 C), temperature source Oral, resp. rate 18, height 5\' 9"  (1.753 m), weight 77.111 kg (170 lb), SpO2 100.00%.Body mass index is 25.09 kg/(m^2).  General Appearance: Casual  Eye Contact::  Good  Speech:  Clear and Coherent  Volume:  Normal  Mood:  Euthymic  Affect:  Appropriate  Thought Process:  Circumstantial  Orientation:  Full (Time, Place, and Person)  Thought Content:  Negative  Suicidal Thoughts:  No  Homicidal Thoughts:  No  Memory:  Immediate;   Fair Recent;   Fair Remote;   Fair  Judgement:  Fair  Insight:  Fair  Psychomotor Activity:  Normal  Concentration:  Fair  Recall:  [employer]Fair  Fund of Knowledge:Fair  Language: Fair  Akathisia:  No  Handed:  Right  AIMS (if indicated):     Assets:  Communication Skills Desire for Improvement  Sleep:  Number of Hours: 4.25    Past  Psychiatric History: Yes Diagnosis: Paranoid Schizophrenia  [facility]  Outpatient Care: [name]  Substance Abuse Care: Once in the pat  Self-Mutilation:Denies   Suicidal Attempts:Denies  Violent Behaviors:Denies   Musculoskeletal: Strength & Muscle Tone: within normal limits Gait & Station: normal Patient leans: N/A  DSM5:  AXIS I: Paranoid schizophrenia, chronic condition  Cocaine use disorder  AXIS II: Cluster B Traits  AXIS III:  Past Medical History   Diagnosis  Date   .  Diabetes mellitus without complication    .  Hypertension    .  Schizophrenia    .  CHF (congestive heart failure)     AXIS IV: other psychosocial or environmental problems, problems related to legal system/crime and problems related to social environment  AXIS V: 61-70 mild symptoms  Level of Care:  OP  Hospital Course: Taylor RobinsonsFrederick [name] is a patient well known to [address], he is a 53 year old male with history of Schizophrenia and cocaine dependence. He reports that he came to the emergency department with complaint of bilateral toe pain, crack cocaine abuse, and suicidal ideation. Patient reports that he was recently released from jail due to tresspassing. He has been on a crack binge for the last 2 days. Patient reports he's been wearing ill fitting shoes, and has rubbed blisters across the top of his toes. He reports a burning type pain to the inside of both great toes. He has history of diabetes. He has not been compliant with his medications and after care instead he has been  self medicating with crack cocaine. Patient is acutely manic, euphoric with pressured speech. He reports that he is having auditory hallucinations, hearing voices telling him to lay in front of moving traffic or jump in front of a moving train.  Patient was admitted to the [facility] hall for further assessment and medication management. He was restarted on his psychiatric medications that he had been noncompliant  with. [name] was ordered Abilify 5 mg BID for psychosis and Artane 5 mg BID for EPS prevention. He also received his monthly injection of Abilify Maintena 400 mg on 10/11/13. His medications to manage medical problems of diabetes and hypertension were also administered. Patient received Lisinopril 20 mg daily, Metformin 500 mg BID, Novolog SSI and four units meal coverage. The patient was compliant with his prescribed medications. [name] was open to talking about his long struggle with cocaine addiction. The patient also admitted to not being complaint with his medical care. He often enjoyed getting high because it distracted him from the pain in his feet. Patient was adamant that he attend a 30 day long term treatment facility upon discharge. He reported wanting to live a healthier lifestyle for his family. Patient will follow up with [name]. He reported that he would find a Primary Care Provider after discharge and actually had one in mind. Patient was provided with a list of substance abuse residential facilities such as [facility]. Prior to discharge the patient adamantly denied psychosis or suicidal thoughts. Patient received samples, prescriptions and discharge instruction. He left [facility] in stable condition with all belongings returned to him.   Consults:  None  Significant Diagnostic Studies:  labs: Admission labs reviewed, CBG monitoring  Discharge Vitals:   Blood pressure 148/74, pulse 76, temperature 97.9 F (36.6 C), temperature source Oral, resp. rate 18, height 5\' 9"  (1.753 m), weight 77.111 kg (170 lb), SpO2 100.00%. Body mass index is 25.09 kg/(m^2). Lab Results:   Results for orders placed during the hospital encounter of 10/08/13 (from the past 72 hour(s))  GLUCOSE, CAPILLARY     Status: Abnormal   Collection Time    10/10/13 11:38 AM      Result Value Ref Range   Glucose-Capillary 132 (*) 70 - 99 mg/dL  GLUCOSE, CAPILLARY     Status: Abnormal   Collection Time    10/10/13  5:03  PM      Result Value Ref Range   Glucose-Capillary 113 (*) 70 - 99 mg/dL  GLUCOSE, CAPILLARY     Status: Abnormal   Collection Time    10/10/13  9:21 PM      Result Value Ref Range   Glucose-Capillary 123 (*) 70 - 99 mg/dL  GLUCOSE, CAPILLARY     Status: Abnormal   Collection Time    10/11/13  5:49 AM      Result Value Ref Range   Glucose-Capillary 135 (*) 70 - 99 mg/dL  GLUCOSE, CAPILLARY     Status: Abnormal   Collection Time    10/11/13 11:55 AM      Result Value Ref Range   Glucose-Capillary 141 (*) 70 - 99 mg/dL   Comment 1 Documented in Chart     Comment 2 Notify RN    GLUCOSE, CAPILLARY     Status: Abnormal   Collection Time    10/11/13  4:54 PM      Result Value Ref Range   Glucose-Capillary 125 (*) 70 - 99 mg/dL   Comment 1 Documented in Chart     Comment 2  Notify RN    GLUCOSE, CAPILLARY     Status: Abnormal   Collection Time    10/11/13  8:34 PM      Result Value Ref Range   Glucose-Capillary 129 (*) 70 - 99 mg/dL   Comment 1 Notify RN    GLUCOSE, CAPILLARY     Status: Abnormal   Collection Time    10/12/13  6:13 AM      Result Value Ref Range   Glucose-Capillary 119 (*) 70 - 99 mg/dL   Comment 1 Notify RN    GLUCOSE, CAPILLARY     Status: Abnormal   Collection Time    10/12/13 11:58 AM      Result Value Ref Range   Glucose-Capillary 120 (*) 70 - 99 mg/dL   Comment 1 Documented in Chart     Comment 2 Notify RN    GLUCOSE, CAPILLARY     Status: Abnormal   Collection Time    10/12/13  4:36 PM      Result Value Ref Range   Glucose-Capillary 146 (*) 70 - 99 mg/dL   Comment 1 Documented in Chart     Comment 2 Notify RN    GLUCOSE, CAPILLARY     Status: Abnormal   Collection Time    10/12/13  8:35 PM      Result Value Ref Range   Glucose-Capillary 149 (*) 70 - 99 mg/dL   Comment 1 Notify RN    GLUCOSE, CAPILLARY     Status: Abnormal   Collection Time    10/13/13  6:17 AM      Result Value Ref Range   Glucose-Capillary 172 (*) 70 - 99 mg/dL     Physical Findings: AIMS: Facial and Oral Movements Muscles of Facial Expression: None, normal Lips and Perioral Area: None, normal Jaw: None, normal Tongue: None, normal,Extremity Movements Upper (arms, wrists, hands, fingers): None, normal Lower (legs, knees, ankles, toes): None, normal, Trunk Movements Neck, shoulders, hips: None, normal, Overall Severity Severity of abnormal movements (highest score from questions above): None, normal Incapacitation due to abnormal movements: None, normal Patient's awareness of abnormal movements (rate only patient's report): No Awareness, Dental Status Current problems with teeth and/or dentures?: No Does patient usually wear dentures?: No  CIWA:    COWS:     Psychiatric Specialty Exam: See Psychiatric Specialty Exam and Suicide Risk Assessment completed by Attending Physician prior to discharge.  Discharge destination:  Other:  Working to get into long term rehab  Is patient on multiple antipsychotic therapies at discharge:  No   Has Patient had three or more failed trials of antipsychotic monotherapy by history:  No  Recommended Plan for Multiple Antipsychotic Therapies: NA  Discharge Orders   Future Orders Complete By Expires   Discharge instructions  As directed        Medication List    STOP taking these medications       hydrOXYzine 50 MG tablet  Commonly known as:  ATARAX/VISTARIL      TAKE these medications     Indication   acetaminophen 500 MG tablet  Commonly known as:  TYLENOL  Take 1,000 mg by mouth every 6 (six) hours as needed for moderate pain.      ARIPiprazole 400 MG Susr  Commonly known as:  ABILIFY MAINTENA  Inject 400 mg into the muscle every 28 (twenty-eight) days. Last received in the hospital on 10/11/13 with next dose due in 28 days.   Indication:  Schizophrenia  ARIPiprazole 5 MG tablet  Commonly known as:  ABILIFY  Take 1 tablet (5 mg total) by mouth 2 (two) times daily after a meal.    Indication:  Schizophrenia     cloNIDine 0.2 MG tablet  Commonly known as:  CATAPRES  Take 1 tablet (0.2 mg total) by mouth 2 (two) times daily.   Indication:  High Blood Pressure     gabapentin 100 MG capsule  Commonly known as:  NEURONTIN  Take 2 capsules (200 mg total) by mouth 2 (two) times daily.   Indication:  Cocaine Dependence, Neuropathic pain     lisinopril 20 MG tablet  Commonly known as:  PRINIVIL,ZESTRIL  Take 1 tablet (20 mg total) by mouth daily. For high blood pressure.   Indication:  High Blood Pressure     metFORMIN 500 MG tablet  Commonly known as:  GLUCOPHAGE  Take 1 tablet (500 mg total) by mouth 2 (two) times daily with a meal.   Indication:  Type 2 Diabetes     naproxen 375 MG tablet  Commonly known as:  NAPROSYN  Take 1 tablet (375 mg total) by mouth 2 (two) times daily with a meal.   Indication:  Mild to Moderate Pain     traZODone 100 MG tablet  Commonly known as:  DESYREL  Take 1 tablet (100 mg total) by mouth at bedtime.   Indication:  Trouble Sleeping     trihexyphenidyl 5 MG tablet  Commonly known as:  ARTANE  Take 1 tablet (5 mg total) by mouth 2 (two) times daily with a meal.   Indication:  Extrapyramidal Reaction caused by Medications           Follow-up Information   Follow up with [name]. (Walk in on this date for hospital discharge appointment. Walk in clinic is Monday - Friday 8 am - 3 pm. They will than schedule you for medication management and therapy appts. )    Contact information:   [address] [place] [address] Phone: [phone] Fax: [phone]      Follow-up recommendations:   Activity: as tolerated  Diet: healthy  Tests: routine  Other: patient to keep his after care appointement   Comments:  Take all your medications as prescribed by your mental healthcare provider.  Report any adverse effects and or reactions from your medicines to your outpatient provider promptly.  Patient is instructed and  cautioned to not engage in alcohol and or illegal drug use while on prescription medicines.  In the event of worsening symptoms, patient is instructed to call the crisis hotline, 911 and or go to the nearest ED for appropriate evaluation and treatment of symptoms.  Follow-up with your primary care provider for your other medical issues, concerns and or health care needs.   Total Discharge Time:  Greater than 30 minutes.  Signed: Fransisca Kaufmann NP-C 10/13/2013, 9:52 AM  Patient seen, evaluated and I agree with notes by Nurse Practitioner. Thedore Mins, MD

## 2013-10-13 NOTE — Progress Notes (Signed)
[facility] Discharge Plan :  Will you be returning to the same living situation after discharge: Yes,  back to his own home At discharge, do you have transportation home?:Yes,  bus pass Do you have the ability to pay for your medications:Yes,  no barriers  Release of information consent forms completed and in the chart;  Patient's signature needed at discharge.  Patient to Follow up at: Follow-up Information   Follow up with [name]. (Walk in on this date for hospital discharge appointment. Walk in clinic is Monday - Friday 8 am - 3 pm. They will than schedule you for medication management and therapy appts. )    Contact information:   [address] [place], [place] [address] Phone: [phone] Fax: [phone]      Patient denies SI/HI:   Yes,  no reports    Safety Planning and Suicide Prevention discussed:  Yes,  see SI note  LCSW gave patient information for his PO officer as well as a list of SA residential facilities for his choosing if he [facility].    Raye SorrowHannah N Azadeh Hyder 10/13/2013, 12:02 PM

## 2013-10-13 NOTE — [facility] Group Notes (Signed)
[facility] LCSW Aftercare Discharge Planning Group Note   10/13/2013 10:39 AM  Participation Quality:    Did not attend.  [name]   

## 2013-10-13 NOTE — Progress Notes (Signed)
LCSW followed up with [facility] reporting patient cannot return for treatment due to recent behaviors and leaving treatment in March 2014. Reports patient's behaviors on the phone and in groups were not appropriate and cannot return until 90 days and must meet criteria to be admitted.  Ashley JacobsHannah Nail, MSW, LCSW Clinical Lead [phone]

## 2013-10-13 NOTE — Progress Notes (Signed)
Patient ID: [name], male   DOB: Nov 22, 1960, 53 y.o.   MRN: [id number] Patient discharged per physician order; patient denies SI/HI and A/V hallucinations; patient received samples, prescriptions, and copy of AVS after it was reviewed; patient had no other questions or concerns at this time; patient verbalized and signed that she received all belongings; patient left the unit ambulatory

## 2013-10-18 NOTE — Progress Notes (Signed)
Patient Discharge Instructions:  After Visit Summary (AVS):   Faxed to:  10/18/13 Discharge Summary Note:   Faxed to:  10/18/13 Psychiatric Admission Assessment Note:   Faxed to:  10/18/13 Suicide Risk Assessment - Discharge Assessment:   Faxed to:  10/18/13 Faxed/Sent to the Next Level Care provider:  10/18/13 [facility] to [facility] @ [phone]  Jerelene ReddenSheena E Granville, 10/18/2013, 4:03 PM

## 2013-10-30 ENCOUNTER — Emergency Department (HOSPITAL_COMMUNITY)
Admission: EM | Admit: 2013-10-30 | Discharge: 2013-10-30 | Disposition: A | Discharge status: Home / Self Care | Payer: Medicare Other | Attending: Emergency Medicine | Admitting: Emergency Medicine

## 2013-10-30 ENCOUNTER — Encounter (HOSPITAL_COMMUNITY): Payer: Self-pay | Admitting: Emergency Medicine

## 2013-10-30 DIAGNOSIS — M79609 Pain in unspecified limb: Secondary | ICD-10-CM | POA: Insufficient documentation

## 2013-10-30 DIAGNOSIS — F209 Schizophrenia, unspecified: Secondary | ICD-10-CM | POA: Insufficient documentation

## 2013-10-30 DIAGNOSIS — M79671 Pain in right foot: Secondary | ICD-10-CM

## 2013-10-30 DIAGNOSIS — E119 Type 2 diabetes mellitus without complications: Secondary | ICD-10-CM | POA: Insufficient documentation

## 2013-10-30 DIAGNOSIS — I10 Essential (primary) hypertension: Secondary | ICD-10-CM | POA: Insufficient documentation

## 2013-10-30 DIAGNOSIS — M21619 Bunion of unspecified foot: Secondary | ICD-10-CM | POA: Insufficient documentation

## 2013-10-30 DIAGNOSIS — Z79899 Other long term (current) drug therapy: Secondary | ICD-10-CM | POA: Insufficient documentation

## 2013-10-30 DIAGNOSIS — Z791 Long term (current) use of non-steroidal anti-inflammatories (NSAID): Secondary | ICD-10-CM | POA: Insufficient documentation

## 2013-10-30 DIAGNOSIS — M79672 Pain in left foot: Secondary | ICD-10-CM

## 2013-10-30 DIAGNOSIS — F17200 Nicotine dependence, unspecified, uncomplicated: Secondary | ICD-10-CM | POA: Insufficient documentation

## 2013-10-30 DIAGNOSIS — I509 Heart failure, unspecified: Secondary | ICD-10-CM | POA: Insufficient documentation

## 2013-10-30 MED ORDER — ACETAMINOPHEN 500 MG PO TABS
1000.0000 mg | ORAL_TABLET | Freq: Once | ORAL | Status: AC
  Administered 2013-10-30: 1000 mg via ORAL
  Filled 2013-10-30: qty 2

## 2013-10-30 NOTE — ED Notes (Addendum)
Pt arrives via EMS. Pt called EMS from street corner c/o low blood sugar. Upon EMS arrival pt c/o feet hurting and neuropathy. Pt stated that he had been walking for 3 hours and hadn't eaten since yesterday. VSS. CBG 110. BP 160/100 HR 90 RR 20. Pt states that he has been walking for 3 hours because he has been smoking crack. States he normally takes tylenol and neurontin for his pain but hasnt taken any today.

## 2013-10-30 NOTE — Discharge Instructions (Signed)
Return here as needed.  Use Tylenol for pain.  Follow up with a primary care Dr.

## 2013-10-30 NOTE — ED Notes (Signed)
Breakfast tray ordered 

## 2013-10-30 NOTE — ED Notes (Signed)
Pt discharged to home by self with bus pass.

## 2013-10-30 NOTE — ED Provider Notes (Signed)
CSN: [id number]     Arrival date & time 10/30/13  0559 History   First MD Initiated Contact with Patient 10/30/13 [phone]     Chief Complaint  Patient presents with  . Foot Pain     (Consider location/radiation/quality/duration/timing/severity/associated sxs/prior Treatment) HPI Patient presents to the emergency room complaining of foot pain, following crack cocaine use. states every time he uses crack, cocaine his feet hurt.  Patient denies any other issues.  The patient, states, that had a, nausea, vomiting, weakness, numbness, dizziness, fever, or syncope.  Patient, states, that the pain has been persistent since last night. is requesting food, tylenol and to soak his feet Past Medical History  Diagnosis Date  . Diabetes mellitus without complication   . Hypertension   . Schizophrenia   . CHF (congestive heart failure)    History reviewed. No pertinent past surgical history. No family history on file. History  Substance Use Topics  . Smoking status: Current Every Day Smoker -- 2.00 packs/day    Types: Cigarettes  . Smokeless tobacco: Current User  . Alcohol Use: No     Comment: pt states no ETOH use    Review of Systems  All other systems negative except as documented in the HPI. All pertinent positives and negatives as reviewed in the HPI.  Allergies  Haldol  Home Medications   Prior to Admission medications   Medication Sig Start Date End Date Taking? Authorizing Provider  acetaminophen (TYLENOL) 500 MG tablet Take 1,000 mg by mouth every 6 (six) hours as needed for moderate pain.   Yes Historical Provider, MD  ARIPiprazole (ABILIFY MAINTENA) 400 MG SUSR Inject 400 mg into the muscle every 28 (twenty-eight) days. Last received in the hospital on 10/11/13 with next dose due in 28 days. 10/13/13  Yes Fransisca KaufmannLaura Davis, NP  ARIPiprazole (ABILIFY) 5 MG tablet Take 1 tablet (5 mg total) by mouth 2 (two) times daily after a meal. 10/13/13  Yes Fransisca KaufmannLaura Davis, NP  cloNIDine (CATAPRES) 0.2  MG tablet Take 1 tablet (0.2 mg total) by mouth 2 (two) times daily. 10/13/13  Yes Fransisca KaufmannLaura Davis, NP  gabapentin (NEURONTIN) 100 MG capsule Take 2 capsules (200 mg total) by mouth 2 (two) times daily. 10/13/13  Yes Fransisca KaufmannLaura Davis, NP  lisinopril (PRINIVIL,ZESTRIL) 20 MG tablet Take 1 tablet (20 mg total) by mouth daily. For high blood pressure. 10/13/13  Yes Fransisca KaufmannLaura Davis, NP  metFORMIN (GLUCOPHAGE) 500 MG tablet Take 1 tablet (500 mg total) by mouth 2 (two) times daily with a meal. 10/13/13  Yes Fransisca KaufmannLaura Davis, NP  naproxen (NAPROSYN) 375 MG tablet Take 1 tablet (375 mg total) by mouth 2 (two) times daily with a meal. 10/13/13  Yes Fransisca KaufmannLaura Davis, NP  traZODone (DESYREL) 100 MG tablet Take 1 tablet (100 mg total) by mouth at bedtime. 10/13/13  Yes Fransisca KaufmannLaura Davis, NP  trihexyphenidyl (ARTANE) 5 MG tablet Take 1 tablet (5 mg total) by mouth 2 (two) times daily with a meal. 10/13/13  Yes Fransisca KaufmannLaura Davis, NP   BP 187/104  Pulse 81  Temp(Src) 97.5 F (36.4 C) (Oral)  Resp 16  Ht 5\' 9"  (1.753 m)  Wt 165 lb (74.844 kg)  BMI 24.36 kg/m2  SpO2 100% Physical Exam  Nursing note and vitals reviewed. Constitutional: He is oriented to person, place, and time. He appears well-developed and well-nourished. No distress.  HENT:  Head: Normocephalic and atraumatic.  Cardiovascular: Normal rate, regular rhythm and normal heart sounds.  Exam reveals no gallop and no friction  rub.   No murmur heard. Pulmonary/Chest: Effort normal and breath sounds normal. No respiratory distress.  Musculoskeletal:       Right foot: Normal.       Left foot: Normal.  Patient has bunions on his feet, but no other issues  Neurological: He is alert and oriented to person, place, and time.    ED Course  Procedures (including critical care time) Patient be discharged home told to return here as needed.  Advised to follow up with a primary care Dr. told to take Tylenol for his pain     Carlyle DollyChristopher W Zacharia Sowles, PA-C 10/30/13 [phone]

## 2013-10-31 NOTE — ED Provider Notes (Signed)
Medical screening examination/treatment/procedure(s) were performed by non-physician practitioner and as supervising physician I was immediately available for consultation/collaboration.   EKG Interpretation None        Khyson Sebesta, MD 10/31/13 0703 

## 2013-11-01 ENCOUNTER — Emergency Department (HOSPITAL_COMMUNITY)
Admission: EM | Admit: 2013-11-01 | Discharge: 2013-11-02 | Disposition: A | Discharge status: Other Acute Inpatient Hospital | Payer: Medicare Other | Attending: Emergency Medicine | Admitting: Emergency Medicine

## 2013-11-01 ENCOUNTER — Encounter (HOSPITAL_COMMUNITY): Payer: Self-pay | Admitting: Emergency Medicine

## 2013-11-01 DIAGNOSIS — F1420 Cocaine dependence, uncomplicated: Secondary | ICD-10-CM | POA: Insufficient documentation

## 2013-11-01 DIAGNOSIS — F1910 Other psychoactive substance abuse, uncomplicated: Secondary | ICD-10-CM

## 2013-11-01 DIAGNOSIS — I509 Heart failure, unspecified: Secondary | ICD-10-CM | POA: Insufficient documentation

## 2013-11-01 DIAGNOSIS — F209 Schizophrenia, unspecified: Secondary | ICD-10-CM

## 2013-11-01 DIAGNOSIS — F1410 Cocaine abuse, uncomplicated: Secondary | ICD-10-CM

## 2013-11-01 DIAGNOSIS — F1994 Other psychoactive substance use, unspecified with psychoactive substance-induced mood disorder: Secondary | ICD-10-CM

## 2013-11-01 DIAGNOSIS — Z79899 Other long term (current) drug therapy: Secondary | ICD-10-CM | POA: Insufficient documentation

## 2013-11-01 DIAGNOSIS — I10 Essential (primary) hypertension: Secondary | ICD-10-CM | POA: Insufficient documentation

## 2013-11-01 DIAGNOSIS — F17200 Nicotine dependence, unspecified, uncomplicated: Secondary | ICD-10-CM | POA: Insufficient documentation

## 2013-11-01 DIAGNOSIS — F200 Paranoid schizophrenia: Secondary | ICD-10-CM | POA: Insufficient documentation

## 2013-11-01 LAB — RAPID URINE DRUG SCREEN, HOSP PERFORMED
AMPHETAMINES: NOT DETECTED
Barbiturates: NOT DETECTED
Benzodiazepines: NOT DETECTED
Cocaine: POSITIVE — AB
Opiates: NOT DETECTED
TETRAHYDROCANNABINOL: NOT DETECTED

## 2013-11-01 LAB — I-STAT TROPONIN, ED: Troponin i, poc: 0.01 ng/mL (ref 0.00–0.08)

## 2013-11-01 LAB — BASIC METABOLIC PANEL
BUN: 15 mg/dL (ref 6–23)
CALCIUM: 9.6 mg/dL (ref 8.4–10.5)
CO2: 27 mEq/L (ref 19–32)
Chloride: 106 mEq/L (ref 96–112)
Creatinine, Ser: 0.85 mg/dL (ref 0.50–1.35)
GFR calc Af Amer: >90 mL/min (ref >90)
GFR calc non Af Amer: >90 mL/min (ref >90)
Glucose, Bld: 164 mg/dL — ABNORMAL HIGH (ref 70–99)
Potassium: 3.3 mEq/L — ABNORMAL LOW (ref 3.7–5.3)
Sodium: 144 mEq/L (ref 137–147)

## 2013-11-01 LAB — CBC
HEMATOCRIT: 35.8 % — AB (ref 39.0–52.0)
Hemoglobin: 11.5 g/dL — ABNORMAL LOW (ref 13.0–17.0)
MCH: 29.1 pg (ref 26.0–34.0)
MCHC: 32.1 g/dL (ref 30.0–36.0)
MCV: 90.6 fL (ref 78.0–100.0)
Platelets: 153 10*3/uL (ref 150–400)
RBC: 3.95 MIL/uL — AB (ref 4.22–5.81)
RDW: 13.9 % (ref 11.5–15.5)
WBC: 4.8 10*3/uL (ref 4.0–10.5)

## 2013-11-01 LAB — ETHANOL

## 2013-11-01 MED ORDER — GABAPENTIN 100 MG PO CAPS
200.0000 mg | ORAL_CAPSULE | Freq: Two times a day (BID) | ORAL | Status: DC
  Administered 2013-11-01 – 2013-11-02 (×2): 200 mg via ORAL
  Filled 2013-11-01 (×4): qty 2

## 2013-11-01 MED ORDER — ARIPIPRAZOLE 5 MG PO TABS
5.0000 mg | ORAL_TABLET | Freq: Every day | ORAL | Status: DC
  Administered 2013-11-01 – 2013-11-02 (×2): 5 mg via ORAL
  Filled 2013-11-01 (×3): qty 1

## 2013-11-01 MED ORDER — NICOTINE 21 MG/24HR TD PT24
21.0000 mg | MEDICATED_PATCH | Freq: Once | TRANSDERMAL | Status: DC
  Administered 2013-11-01: 21 mg via TRANSDERMAL
  Filled 2013-11-01: qty 1

## 2013-11-01 MED ORDER — CLONIDINE HCL 0.2 MG PO TABS
0.2000 mg | ORAL_TABLET | Freq: Two times a day (BID) | ORAL | Status: DC
  Administered 2013-11-01 – 2013-11-02 (×2): 0.2 mg via ORAL
  Filled 2013-11-01 (×2): qty 1

## 2013-11-01 MED ORDER — TRAZODONE HCL 50 MG PO TABS
100.0000 mg | ORAL_TABLET | Freq: Every day | ORAL | Status: DC
  Administered 2013-11-01: 100 mg via ORAL
  Filled 2013-11-01 (×2): qty 2

## 2013-11-01 MED ORDER — LISINOPRIL 20 MG PO TABS
20.0000 mg | ORAL_TABLET | Freq: Every day | ORAL | Status: DC
  Administered 2013-11-01 – 2013-11-02 (×2): 20 mg via ORAL
  Filled 2013-11-01 (×2): qty 1

## 2013-11-01 MED ORDER — NITROGLYCERIN 0.4 MG SL SUBL: 0.4000 mg | SUBLINGUAL_TABLET | SUBLINGUAL | Status: DC | PRN

## 2013-11-01 MED ORDER — METFORMIN HCL 500 MG PO TABS
500.0000 mg | ORAL_TABLET | Freq: Two times a day (BID) | ORAL | Status: DC
  Administered 2013-11-01 – 2013-11-02 (×2): 500 mg via ORAL
  Filled 2013-11-01 (×2): qty 1

## 2013-11-01 MED ORDER — ACETAMINOPHEN 325 MG PO TABS
650.0000 mg | ORAL_TABLET | ORAL | Status: DC | PRN
  Administered 2013-11-01 (×3): 650 mg via ORAL
  Filled 2013-11-01 (×3): qty 2

## 2013-11-01 NOTE — Progress Notes (Signed)
  CARE MANAGEMENT ED NOTE 11/01/2013  Patient:  Taylor DouglasWHITSETT,[name] L   Account Number:  [id number]  Date Initiated:  11/01/2013  Documentation initiated by:  Ferdinand CavaSCHETTINO,Torian Quintero  Subjective/Objective Assessment:   53 yo male presenting to the ED requesting detox program     Subjective/Objective Assessment Detail:   [name] is a 53 y.o. male patient presenting to the [facility] initially with complaints of hearing voices, telling him [facility]" along with complaints of just finishing a crack binge of $300 in the past 72 hrs. Pt is well-known to this NP and other [facility] staff, having [facility] and 29 yrs in the mental health system overall. Pt states he has been struggling with crack/cocaine addiction since 1986. [facility] as he is denying SI, HI, and VH, contracting for safety, and only reporting "mild mumbling of voices" without commands, which he reports is chronic. Pt agreeable to seek inpatient placement at other facilities for treatment.     Action/Plan:   Action/Plan Detail:   Anticipated DC Date:       Status Recommendation to Physician:   Result of Recommendation:      DC Planning Services  CM consult  Other  PCP issues    Choice offered to / List presented to:  C-1 Patient          Status of service:  Completed, signed off  ED Comments:   ED Comments Detail:  Cm spoke with patient regarding ED visit and no PCP with Medicare coverage. Provided patient with a list of Medicare accepting physicians in his zip code. He is familiar with the area. Offered to call the practice that he chose off the list to make an initial appointment and patient stated that he would prefer to make the appointment and have his cousin help him. He stated that he needs to see a doctor so he can get a physical to get his drivers license back. Patient spoke about his medical health and concerns from smoking crack. This CM spoke with patient about the risks and  effects of not managing his DM and patient verbalized understanding because he sees other people in the community that have not managed their health. Spoke with patient regarding [facility] and patient stated that he was kicked out and is not allowed to return. He stated that [facility] but he has not been recently. He stated that he knows he needs to go there to manage his mental health and "get those medications". Patient stated that he does not need resources for [facility] because he has worked with them in the past.

## 2013-11-01 NOTE — ED Notes (Signed)
Pt moved to pod c breakfast oredered

## 2013-11-01 NOTE — ED Provider Notes (Signed)
CSN: [id number]     Arrival date & time 11/01/13  0357 History   First MD Initiated Contact with Patient 11/01/13 0448     Chief Complaint  Patient presents with  . Chest Pain     (Consider location/radiation/quality/duration/timing/severity/associated sxs/prior Treatment) HPI Patient is a 53 yo man with schizophrenia and a long history of crack cocaine abuse. He presents after a multi day binge on crack cocaine. He estimates that he has used about $300 of crack over the past 72 hrs.   He comes in because he wants "long term treatment for crack cocaine". Patient says he wants to go somewhere for 60 days or longer. Says he is hearing voices [facility]. Patient called 911 and reported that he ws having chest pain. However, he denies any chest pain during my taking of his history.   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   The patient states that he is compliant with prescribed medications.    Past Medical History  Diagnosis Date  . Diabetes mellitus without complication   . Hypertension   . Schizophrenia   . CHF (congestive heart failure)    History reviewed. No pertinent past surgical history. History reviewed. No pertinent family history. History  Substance Use Topics  . Smoking status: Current Every Day Smoker -- 2.00 packs/day    Types: Cigarettes  . Smokeless tobacco: Current User  . Alcohol Use: No     Comment: pt states no ETOH use    Review of Systems   Ten point review of symptoms performed and is negative with the exception of symptoms noted above.   Allergies  Haldol  Home Medications   Prior to Admission medications   Medication Sig Start Date End Date Taking? Authorizing Provider  ARIPiprazole (ABILIFY MAINTENA) 400 MG SUSR Inject 400 mg into the muscle every 28 (twenty-eight) days. Last received in the hospital on 10/11/13 with next dose due in 28 days. 10/13/13  Yes Fransisca KaufmannLaura  Davis, NP  ARIPiprazole (ABILIFY) 5 MG tablet Take 1 tablet (5 mg total) by mouth 2 (two) times daily after a meal. 10/13/13  Yes Fransisca KaufmannLaura Davis, NP  cloNIDine (CATAPRES) 0.2 MG tablet Take 1 tablet (0.2 mg total) by mouth 2 (two) times daily. 10/13/13  Yes Fransisca KaufmannLaura Davis, NP  gabapentin (NEURONTIN) 100 MG capsule Take 2 capsules (200 mg total) by mouth 2 (two) times daily. 10/13/13  Yes Fransisca KaufmannLaura Davis, NP  lisinopril (PRINIVIL,ZESTRIL) 20 MG tablet Take 1 tablet (20 mg total) by mouth daily. For high blood pressure. 10/13/13  Yes Fransisca KaufmannLaura Davis, NP  metFORMIN (GLUCOPHAGE) 500 MG tablet Take 1 tablet (500 mg total) by mouth 2 (two) times daily with a meal. 10/13/13  Yes Fransisca KaufmannLaura Davis, NP  naproxen (NAPROSYN) 375 MG tablet Take 1 tablet (375 mg total) by mouth 2 (two) times daily with a meal. 10/13/13  Yes Fransisca KaufmannLaura Davis, NP  traZODone (DESYREL) 100 MG tablet Take 1 tablet (100 mg total) by mouth at bedtime. 10/13/13  Yes Fransisca KaufmannLaura Davis, NP  trihexyphenidyl (ARTANE) 5 MG tablet Take 1 tablet (5 mg total) by mouth 2 (two) times daily with a meal. 10/13/13  Yes Fransisca KaufmannLaura Davis, NP   Pulse 74  Temp(Src) 98.6 F (37 C) (Oral)  Resp 20  SpO2 98% Physical Exam Gen: well developed and well nourished appearing, mildly disheveled appearing,  Head: NCAT Eyes: PERL, EOMI Nose: no epistaixis or rhinorrhea Mouth/throat: mucosa is moist and  pink Neck: supple, no stridor Lungs: CTA B, no wheezing, rhonchi or rales CV: RRR, no murmur, extremities appear well perfused.  Abd: soft, notender, nondistended Back: no ttp, no cva ttp Skin: warm and dry Ext: normal to inspection, feet with calluses and some uninfected chronic appearing blisters of the hindfoot, no dependent edema Neuro: CN ii-xii grossly intact, no focal deficits Psyche; normal affect,  Pleasant, calm and cooperative.   ED Course  Procedures (including critical care time) Labs Review  Results for orders placed during the hospital encounter of 11/01/13 (from the past 24  hour(s))  CBC     Status: Abnormal   Collection Time    11/01/13  4:15 AM      Result Value Ref Range   WBC 4.8  4.0 - 10.5 K/uL   RBC 3.95 (*) 4.22 - 5.81 MIL/uL   Hemoglobin 11.5 (*) 13.0 - 17.0 g/dL   HCT 16.135.8 (*) 09.639.0 - 04.552.0 %   MCV 90.6  78.0 - 100.0 fL   MCH 29.1  26.0 - 34.0 pg   MCHC 32.1  30.0 - 36.0 g/dL   RDW 40.913.9  81.111.5 - 91.415.5 %   Platelets 153  150 - 400 K/uL  BASIC METABOLIC PANEL     Status: Abnormal   Collection Time    11/01/13  4:15 AM      Result Value Ref Range   Sodium 144  137 - 147 mEq/L   Potassium 3.3 (*) 3.7 - 5.3 mEq/L   Chloride 106  96 - 112 mEq/L   CO2 27  19 - 32 mEq/L   Glucose, Bld 164 (*) 70 - 99 mg/dL   BUN 15  6 - 23 mg/dL   Creatinine, Ser 7.820.85  0.50 - 1.35 mg/dL   Calcium 9.6  8.4 - 95.610.5 mg/dL   GFR calc non Af Amer >90  >90 mL/min   GFR calc Af Amer >90  >90 mL/min  I-STAT TROPOININ, ED     Status: None   Collection Time    11/01/13  4:22 AM      Result Value Ref Range   Troponin i, poc 0.01  0.00 - 0.08 ng/mL   Comment 3           ETHANOL     Status: None   Collection Time    11/01/13  4:24 AM      Result Value Ref Range   Alcohol, Ethyl (B) <11  0 - 11 mg/dL    EKG: nsr, no acute ischemic changes, normal intervals, normal axis, normal qrs complex MDM   Patient has been medically cleared. He denies any history of chest pain during my interview with him and says he is here for psyche placement. Thus, I do not feel that 2nd troponin is indicated to clear the patient. We are awaiting TTS consultation.    Brandt LoosenJulie Coral Timme, MD 11/01/13 [id number]

## 2013-11-01 NOTE — Progress Notes (Signed)
[facility] referred to [facility].

## 2013-11-01 NOTE — Progress Notes (Addendum)
Per Heloise Purpura, the patient will need placement for substance abuse.  Patient has met his max therapeutic benefit at [facility]. TTS will seek substance abuse placement.

## 2013-11-01 NOTE — ED Notes (Signed)
Patient requests for mental health to see him.

## 2013-11-01 NOTE — ED Notes (Signed)
Pt requested Abilify three times in 15 minutes. When pt was advised that it's going to take some time to get it for him, he became agitated and raised his voice. Pt stated, "I just asked you if you could check on it! I just asked you a question!" RNs Vicente SereneGabriel and Lawson FiscalLori were notified.

## 2013-11-01 NOTE — Consult Note (Signed)
Telepsych Consultation   Reason for Consult:  Detox from cocaine, possible auditory hallucinations Referring Physician:  EDP JOBANI [name] is an 53 y.o. male.  Assessment: AXIS I:  Substance Abuse and Substance Induced Mood Disorder, Schizophrenia AXIS II:  Deferred AXIS III:   Past Medical History  Diagnosis Date  . Diabetes mellitus without complication   . Hypertension   . Schizophrenia   . CHF (congestive heart failure)    AXIS IV:  economic problems, educational problems, housing problems, occupational problems, other psychosocial or environmental problems, problems related to legal system/crime, problems related to social environment, problems with access to health care services and problems with primary support group AXIS V:  41-50 serious symptoms  Plan:  Recommend psychiatric Inpatient admission when medically cleared.  Subjective:   [name] is a 53 y.o. male patient presenting [facility] with complaints of hearing voices, telling him to "go to [facility]" along with complaints of just finishing a crack binge of $300 in the past 72 hrs. Pt is well-known to this NP and other [facility] staff, having over 15 admissions to [facility] and 29 yrs in the mental health system overall. Pt states he has been struggling with crack/cocaine addiction since 1986. Pt is not a candidate for [facility] as he is denying SI, HI, and VH, contracting for safety, and only reporting "mild mumbling of voices" without commands, which he reports is chronic. Pt agreeable to seek inpatient placement at other facilities for treatment.   HPI:  Patient is a 53 yo man with schizophrenia and a long history of crack cocaine abuse. He presents after a multi day binge on crack cocaine. He estimates that he has used about $300 of crack over the past 72 hrs. He comes in because he wants "long term treatment for crack cocaine". Patient says he wants to go somewhere for 60 days or longer. Says he is hearing voices  telling him to talk to [facility]. Patient called 911 and reported that he ws having chest pain. However, he denies any chest pain during my taking of his history. 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   HPI Elements:   Location:  Inpatient, [facility]. Quality:  Stable. Severity:  Mild/Moderate. Timing:  Constant. Duration: Chronic x 29 yrs Context:  Pt has a court day tomorrow and is fixated on a note for this..  Past Psychiatric History: Past Medical History  Diagnosis Date  . Diabetes mellitus without complication   . Hypertension   . Schizophrenia   . CHF (congestive heart failure)     reports that he has been smoking Cigarettes.  He has been smoking about 2.00 packs per day. He uses smokeless tobacco. He reports that he uses illicit drugs ("Crack" cocaine and Cocaine) about 7 times per week. He reports that he does not drink alcohol. History reviewed. No pertinent family history.       Allergies:   Allergies  Allergen Reactions  . Haldol [Haloperidol] Other (See Comments)    Muscle spasms, loss of voluntary movement    ACT Assessment Complete:  Yes:    Educational Status    Risk to Self: Risk to self Is patient at risk for suicide?: No Substance abuse history and/or treatment for substance abuse?: No  Risk to Others:    Abuse:    Prior Inpatient Therapy:    Prior Outpatient Therapy:    Additional Information:  Objective: Blood pressure 154/125, pulse 68, temperature 98.6 F (37 C), temperature source Oral, resp. rate 19, SpO2 99.00%.There is no weight on file to calculate BMI. Results for orders placed during the hospital encounter of 11/01/13 (from the past 72 hour(s))  CBC     Status: Abnormal   Collection Time    11/01/13  4:15 AM      Result Value Ref Range   WBC 4.8  4.0 - 10.5 K/uL   RBC 3.95 (*) 4.22 - 5.81 MIL/uL   Hemoglobin 11.5 (*) 13.0 - 17.0 g/dL   HCT 35.8  (*) 39.0 - 52.0 %   MCV 90.6  78.0 - 100.0 fL   MCH 29.1  26.0 - 34.0 pg   MCHC 32.1  30.0 - 36.0 g/dL   RDW 13.9  11.5 - 15.5 %   Platelets 153  150 - 400 K/uL  BASIC METABOLIC PANEL     Status: Abnormal   Collection Time    11/01/13  4:15 AM      Result Value Ref Range   Sodium 144  137 - 147 mEq/L   Potassium 3.3 (*) 3.7 - 5.3 mEq/L   Chloride 106  96 - 112 mEq/L   CO2 27  19 - 32 mEq/L   Glucose, Bld 164 (*) 70 - 99 mg/dL   BUN 15  6 - 23 mg/dL   Creatinine, Ser 0.85  0.50 - 1.35 mg/dL   Calcium 9.6  8.4 - 10.5 mg/dL   GFR calc non Af Amer >90  >90 mL/min   GFR calc Af Amer >90  >90 mL/min   Comment: (NOTE)     The eGFR has been calculated using the CKD EPI equation.     This calculation has not been validated in all clinical situations.     eGFR's persistently <90 mL/min signify possible Chronic Kidney     Disease.  Randolm Idol, ED     Status: None   Collection Time    11/01/13  4:22 AM      Result Value Ref Range   Troponin i, poc 0.01  0.00 - 0.08 ng/mL   Comment 3            Comment: Due to the release kinetics of cTnI,     a negative result within the first hours     of the onset of symptoms does not rule out     myocardial infarction with certainty.     If myocardial infarction is still suspected,     repeat the test at appropriate intervals.  ETHANOL     Status: None   Collection Time    11/01/13  4:24 AM      Result Value Ref Range   Alcohol, Ethyl (B) <11  0 - 11 mg/dL   Comment:            LOWEST DETECTABLE LIMIT FOR     SERUM ALCOHOL IS 11 mg/dL     FOR MEDICAL PURPOSES ONLY  URINE RAPID DRUG SCREEN (HOSP PERFORMED)     Status: Abnormal   Collection Time    11/01/13  7:30 AM      Result Value Ref Range   Opiates NONE DETECTED  NONE DETECTED   Cocaine POSITIVE (*) NONE DETECTED   Benzodiazepines NONE DETECTED  NONE DETECTED   Amphetamines NONE DETECTED  NONE DETECTED   Tetrahydrocannabinol NONE DETECTED  NONE DETECTED   Barbiturates NONE  DETECTED  NONE DETECTED   Comment:  DRUG SCREEN FOR MEDICAL PURPOSES     ONLY.  IF CONFIRMATION IS NEEDED     FOR ANY PURPOSE, NOTIFY LAB     WITHIN 5 DAYS.                LOWEST DETECTABLE LIMITS     FOR URINE DRUG SCREEN     Drug Class       Cutoff (ng/mL)     Amphetamine      1000     Barbiturate      200     Benzodiazepine   937     Tricyclics       342     Opiates          300     Cocaine          300     THC              50   Labs are reviewed and are pertinent for Potassium 3.3, (+) cocaine.   Current Facility-Administered Medications  Medication Dose Route Frequency Provider Last Rate Last Dose  . nitroGLYCERIN (NITROSTAT) SL tablet 0.4 mg  0.4 mg Sublingual Q5 min PRN Elyn Peers, MD       Current Outpatient Prescriptions  Medication Sig Dispense Refill  . ARIPiprazole (ABILIFY MAINTENA) 400 MG SUSR Inject 400 mg into the muscle every 28 (twenty-eight) days. Last received in the hospital on 10/11/13 with next dose due in 28 days.  1 each  3  . ARIPiprazole (ABILIFY) 5 MG tablet Take 1 tablet (5 mg total) by mouth 2 (two) times daily after a meal.  60 tablet  0  . cloNIDine (CATAPRES) 0.2 MG tablet Take 1 tablet (0.2 mg total) by mouth 2 (two) times daily.  60 tablet  0  . gabapentin (NEURONTIN) 100 MG capsule Take 2 capsules (200 mg total) by mouth 2 (two) times daily.  120 capsule  0  . lisinopril (PRINIVIL,ZESTRIL) 20 MG tablet Take 1 tablet (20 mg total) by mouth daily. For high blood pressure.  30 tablet  0  . metFORMIN (GLUCOPHAGE) 500 MG tablet Take 1 tablet (500 mg total) by mouth 2 (two) times daily with a meal.  60 tablet  0  . naproxen (NAPROSYN) 375 MG tablet Take 1 tablet (375 mg total) by mouth 2 (two) times daily with a meal.  60 tablet  0  . traZODone (DESYREL) 100 MG tablet Take 1 tablet (100 mg total) by mouth at bedtime.  30 tablet  0  . trihexyphenidyl (ARTANE) 5 MG tablet Take 1 tablet (5 mg total) by mouth 2 (two) times daily with a meal.  60  tablet  0    Psychiatric Specialty Exam:     Blood pressure 154/125, pulse 68, temperature 98.6 F (37 C), temperature source Oral, resp. rate 19, SpO2 99.00%.There is no weight on file to calculate BMI.  General Appearance: Disheveled  Eye Contact::  Good  Speech:  Clear and Coherent  Volume:  Normal  Mood:  Anxious  Affect:  Appropriate  Thought Process:  Goal Directed  Orientation:  Full (Time, Place, and Person)  Thought Content:  WDL  Suicidal Thoughts:  No  Homicidal Thoughts:  No  Memory:  Immediate;   Good Recent;   Good Remote;   Good  Judgement:  Fair  Insight:  Fair  Psychomotor Activity:  Normal  Concentration:  Good  Recall:  Fair  Akathisia:  NA  Handed:  Right  AIMS (if indicated):     Assets:  Communication Skills Desire for Improvement Resilience  Sleep:      Treatment Plan Summary: See below  Disposition: [facility] team is seeking placement elsewhere (Not appropriate for [facility]) for detoxification/treatment.    *Reviewed with Dr. Dwyane Dee.    Benjamine Mola, FNP-BC 11/01/2013 9:18 AM

## 2013-11-01 NOTE — ED Notes (Signed)
[facility] Healthy Diet Ordered

## 2013-11-01 NOTE — BH Assessment (Signed)
[facility] called requesting patient's last set of vitals which is elevated. Johnathan in intake at [facility] reports that patient can be considered for admission at [facility] once is B/P is at least 150/90. Spoke with pt's nurse Lorie who reports that she would check his blood pressure again.   Glorious PeachNajah Tramon Crescenzo, MS, LCASA Assessment Counselor

## 2013-11-01 NOTE — ED Notes (Signed)
Dr. Manly at the bedside.  

## 2013-11-01 NOTE — ED Notes (Signed)
Per EMS, the patient is here for evaluation for chest pain secondary to crack use.  He reports using about $300 in the past day.  Vitals BP 165/96, P 85, normal sinus rhythm, EKG unremarkable.  Patient is homeless.

## 2013-11-01 NOTE — Progress Notes (Signed)
Per Nicola GirtJohnathan at [facility] the patients blood pressure must be 150/90 before he can be considered for inpatient treatment.

## 2013-11-01 NOTE — Progress Notes (Signed)
Writer informed the Press photographerCharge Nurse Efraim Kaufmann(Melissa) that the patient will receive a Tele Psych at 8:45am.

## 2013-11-01 NOTE — ED Notes (Signed)
Updated pt on his plan of care and that he has been accepted [facility].  Pt. Understands that his BP has to be lower and pt.agrees to going to Facility.

## 2013-11-02 MED ORDER — POTASSIUM CHLORIDE CRYS ER 20 MEQ PO TBCR
40.0000 meq | EXTENDED_RELEASE_TABLET | Freq: Once | ORAL | Status: AC
  Administered 2013-11-02: 40 meq via ORAL
  Filled 2013-11-02: qty 2

## 2013-11-02 NOTE — Consult Note (Signed)
Case discussed, agree with plan 

## 2013-11-02 NOTE — ED Notes (Signed)
Spoke with Rosey Batheresa at the [employer] de[facility]. At [facility] and updated re: B/P. States she would [facility] and have them all faxed to fax # [phone].

## 2013-11-02 NOTE — BHH Counselor (Signed)
[name]. Pt [facility] Pt will sign in as voluntary per [name] pt's RN. [name] will contact [facility] to transport. # for report is [phone].  Taylor Cristalaroline Paige Ailish Prospero, [place] Assessment Counselor

## 2013-11-02 NOTE — ED Notes (Signed)
PATIENTS BELONGINGS (CLOTHING)  [facility]

## 2013-11-02 NOTE — ED Notes (Addendum)
B/P Results faxed to [facility].

## 2013-11-03 NOTE — Progress Notes (Signed)
Pt asked to speak to CSW in regards to questions surrounding his disability check. Pt informed CSW that he is currently living with his sister and he has not received his benefits. CSW placed call to SSDI and pt was told his check is in the mail. Pt voiced understanding. Also pt requested telephone number to his probation officer and asked CSW to call. CSW gave [place] and instructed pt on how to navigate the hospital phone to contact his probation officer. Pt voiced understanding and placed call. Pt. Appreciative of CSW efforts. Pt to be discharged to [facility].   [address] Hulbert, [place] [phone]

## 2013-12-10 ENCOUNTER — Emergency Department (HOSPITAL_COMMUNITY)
Admission: EM | Admit: 2013-12-10 | Discharge: 2013-12-10 | Disposition: A | Discharge status: Home / Self Care | Payer: Medicare Other | Attending: Emergency Medicine | Admitting: Emergency Medicine

## 2013-12-10 ENCOUNTER — Encounter (HOSPITAL_COMMUNITY): Payer: Self-pay | Admitting: Emergency Medicine

## 2013-12-10 DIAGNOSIS — F200 Paranoid schizophrenia: Secondary | ICD-10-CM | POA: Insufficient documentation

## 2013-12-10 DIAGNOSIS — F411 Generalized anxiety disorder: Secondary | ICD-10-CM | POA: Insufficient documentation

## 2013-12-10 DIAGNOSIS — I10 Essential (primary) hypertension: Secondary | ICD-10-CM | POA: Insufficient documentation

## 2013-12-10 DIAGNOSIS — Z791 Long term (current) use of non-steroidal anti-inflammatories (NSAID): Secondary | ICD-10-CM | POA: Insufficient documentation

## 2013-12-10 DIAGNOSIS — F17200 Nicotine dependence, unspecified, uncomplicated: Secondary | ICD-10-CM | POA: Insufficient documentation

## 2013-12-10 DIAGNOSIS — F1420 Cocaine dependence, uncomplicated: Secondary | ICD-10-CM | POA: Insufficient documentation

## 2013-12-10 DIAGNOSIS — F209 Schizophrenia, unspecified: Secondary | ICD-10-CM

## 2013-12-10 DIAGNOSIS — F1010 Alcohol abuse, uncomplicated: Secondary | ICD-10-CM

## 2013-12-10 DIAGNOSIS — E119 Type 2 diabetes mellitus without complications: Secondary | ICD-10-CM | POA: Insufficient documentation

## 2013-12-10 DIAGNOSIS — Z79899 Other long term (current) drug therapy: Secondary | ICD-10-CM | POA: Insufficient documentation

## 2013-12-10 DIAGNOSIS — R45851 Suicidal ideations: Secondary | ICD-10-CM | POA: Insufficient documentation

## 2013-12-10 DIAGNOSIS — F1910 Other psychoactive substance abuse, uncomplicated: Secondary | ICD-10-CM

## 2013-12-10 DIAGNOSIS — F1994 Other psychoactive substance use, unspecified with psychoactive substance-induced mood disorder: Secondary | ICD-10-CM

## 2013-12-10 DIAGNOSIS — I509 Heart failure, unspecified: Secondary | ICD-10-CM | POA: Insufficient documentation

## 2013-12-10 LAB — COMPREHENSIVE METABOLIC PANEL
ALBUMIN: 4.4 g/dL (ref 3.5–5.2)
ALT: 16 U/L (ref 0–53)
AST: 27 U/L (ref 0–37)
Alkaline Phosphatase: 73 U/L (ref 39–117)
BUN: 47 mg/dL — ABNORMAL HIGH (ref 6–23)
CALCIUM: 10 mg/dL (ref 8.4–10.5)
CO2: 27 mEq/L (ref 19–32)
CREATININE: 1.79 mg/dL — AB (ref 0.50–1.35)
Chloride: 100 mEq/L (ref 96–112)
GFR calc Af Amer: 49 mL/min — ABNORMAL LOW (ref >90)
GFR, EST NON AFRICAN AMERICAN: 42 mL/min — AB (ref >90)
Glucose, Bld: 157 mg/dL — ABNORMAL HIGH (ref 70–99)
Potassium: 3.4 mEq/L — ABNORMAL LOW (ref 3.7–5.3)
Sodium: 140 mEq/L (ref 137–147)
TOTAL PROTEIN: 8.5 g/dL — AB (ref 6.0–8.3)
Total Bilirubin: 1 mg/dL (ref 0.3–1.2)

## 2013-12-10 LAB — CBC WITH DIFFERENTIAL/PLATELET
BASOS ABS: 0 10*3/uL (ref 0.0–0.1)
BASOS PCT: 0 % (ref 0–1)
EOS PCT: 0 % (ref 0–5)
Eosinophils Absolute: 0 10*3/uL (ref 0.0–0.7)
HEMATOCRIT: 37.5 % — AB (ref 39.0–52.0)
Hemoglobin: 12.2 g/dL — ABNORMAL LOW (ref 13.0–17.0)
Lymphocytes Relative: 23 % (ref 12–46)
Lymphs Abs: 1.5 10*3/uL (ref 0.7–4.0)
MCH: 28.6 pg (ref 26.0–34.0)
MCHC: 32.5 g/dL (ref 30.0–36.0)
MCV: 87.8 fL (ref 78.0–100.0)
MONO ABS: 0.5 10*3/uL (ref 0.1–1.0)
Monocytes Relative: 8 % (ref 3–12)
Neutro Abs: 4.7 10*3/uL (ref 1.7–7.7)
Neutrophils Relative %: 69 % (ref 43–77)
PLATELETS: 231 10*3/uL (ref 150–400)
RBC: 4.27 MIL/uL (ref 4.22–5.81)
RDW: 13 % (ref 11.5–15.5)
WBC: 6.7 10*3/uL (ref 4.0–10.5)

## 2013-12-10 LAB — ETHANOL: Alcohol, Ethyl (B): <11 mg/dL (ref 0–11)

## 2013-12-10 LAB — RAPID URINE DRUG SCREEN, HOSP PERFORMED
AMPHETAMINES: NOT DETECTED
BENZODIAZEPINES: NOT DETECTED
Barbiturates: NOT DETECTED
COCAINE: POSITIVE — AB
OPIATES: NOT DETECTED
TETRAHYDROCANNABINOL: NOT DETECTED

## 2013-12-10 LAB — CBG MONITORING, ED: GLUCOSE-CAPILLARY: 137 mg/dL — AB (ref 70–99)

## 2013-12-10 MED ORDER — IBUPROFEN 200 MG PO TABS
600.0000 mg | ORAL_TABLET | Freq: Three times a day (TID) | ORAL | Status: DC | PRN
  Administered 2013-12-10: 600 mg via ORAL
  Filled 2013-12-10: qty 3

## 2013-12-10 MED ORDER — TRIHEXYPHENIDYL HCL 5 MG PO TABS
5.0000 mg | ORAL_TABLET | Freq: Two times a day (BID) | ORAL | Status: DC
  Administered 2013-12-10: 5 mg via ORAL
  Filled 2013-12-10 (×3): qty 1

## 2013-12-10 MED ORDER — LISINOPRIL 20 MG PO TABS
20.0000 mg | ORAL_TABLET | Freq: Every day | ORAL | Status: DC
  Filled 2013-12-10 (×3): qty 1

## 2013-12-10 MED ORDER — ZOLPIDEM TARTRATE 5 MG PO TABS: 5.0000 mg | ORAL_TABLET | Freq: Every evening | ORAL | Status: DC | PRN

## 2013-12-10 MED ORDER — CLONIDINE HCL 0.1 MG PO TABS
0.2000 mg | ORAL_TABLET | Freq: Two times a day (BID) | ORAL | Status: DC
  Administered 2013-12-10: 0.2 mg via ORAL
  Filled 2013-12-10: qty 2

## 2013-12-10 MED ORDER — CLONIDINE HCL 0.1 MG PO TABS: 0.2000 mg | ORAL_TABLET | Freq: Two times a day (BID) | ORAL | Status: DC

## 2013-12-10 MED ORDER — POTASSIUM CHLORIDE CRYS ER 20 MEQ PO TBCR
40.0000 meq | EXTENDED_RELEASE_TABLET | Freq: Once | ORAL | Status: AC
  Administered 2013-12-10: 40 meq via ORAL
  Filled 2013-12-10: qty 2

## 2013-12-10 MED ORDER — METFORMIN HCL 500 MG PO TABS: 500.0000 mg | ORAL_TABLET | Freq: Two times a day (BID) | ORAL | Status: DC

## 2013-12-10 MED ORDER — TRAZODONE HCL 100 MG PO TABS: 100.0000 mg | ORAL_TABLET | Freq: Every day | ORAL | Status: DC

## 2013-12-10 MED ORDER — ARIPIPRAZOLE 5 MG PO TABS
5.0000 mg | ORAL_TABLET | Freq: Two times a day (BID) | ORAL | Status: DC
  Administered 2013-12-10: 5 mg via ORAL
  Filled 2013-12-10 (×3): qty 1

## 2013-12-10 MED ORDER — NICOTINE 21 MG/24HR TD PT24
21.0000 mg | MEDICATED_PATCH | Freq: Every day | TRANSDERMAL | Status: DC
  Administered 2013-12-10: 21 mg via TRANSDERMAL

## 2013-12-10 MED ORDER — LORAZEPAM 1 MG PO TABS: 1.0000 mg | ORAL_TABLET | Freq: Three times a day (TID) | ORAL | Status: DC | PRN

## 2013-12-10 MED ORDER — GABAPENTIN 100 MG PO CAPS
200.0000 mg | ORAL_CAPSULE | Freq: Two times a day (BID) | ORAL | Status: DC
  Administered 2013-12-10: 200 mg via ORAL
  Filled 2013-12-10 (×5): qty 2

## 2013-12-10 MED ORDER — ONDANSETRON HCL 4 MG PO TABS: 4.0000 mg | ORAL_TABLET | Freq: Three times a day (TID) | ORAL | Status: DC | PRN

## 2013-12-10 NOTE — BH Assessment (Signed)
BHH Assessment Progress Note  Spoke with Dr. Lucianne MussKumar and Assunta FoundShuvon Rankin, NP, who stated pt to be discharged with AA referrals.  These were given to the pt by this clinician.  Pt to be discharged.  Casimer LaniusKristen Oddie Bottger, MS, [facility] Licensed Professional Counselor Triage Specialist

## 2013-12-10 NOTE — Discharge Summary (Signed)
Physician Discharge Summary Note  Patient:  [name] is an 53 y.o., male MRN:  [id number] DOB:  [birth year] Patient phone:  [phone] (home)  Patient address:   [place] [id number],  Total Time spent with patient: 1 hour  Date of Admission:  12/10/2013 Date of Discharge: 12/10/2013  Reason for Admission:  Requesting alcohol detox.  Discharge Diagnoses: Active Problems:   * No active hospital problems. *  Family History  Problem Relation Age of Onset  . Hypertension Other   . Diabetes Other     Psychiatric Specialty Exam: Physical Exam  Constitutional: He is oriented to person, place, and time.  Neck: Normal range of motion.  Respiratory: Effort normal.  Musculoskeletal: Normal range of motion.  Neurological: He is alert and oriented to person, place, and time.  Skin: Skin is warm.  Psychiatric: He has a normal mood and affect. His speech is normal and behavior is normal. Thought content is not paranoid. He expresses no homicidal and no suicidal ideation.    Review of Systems  Constitutional: Negative for diaphoresis.  Musculoskeletal: Negative.   Neurological: Negative for dizziness, tremors and headaches.  Psychiatric/Behavioral: Positive for substance abuse. Negative for depression, suicidal ideas, hallucinations and memory loss. The patient is not nervous/anxious and does not have insomnia.     Blood pressure 110/71, pulse 65, temperature 98 F (36.7 C), temperature source Oral, resp. rate 19, SpO2 100.00%.There is no weight on file to calculate BMI.  General Appearance: Casual  Eye Contact::  Good  Speech:  Clear and Coherent  Volume:  Normal  Mood:  "Good"  Affect:  Congruent  Thought Process:  Circumstantial and Goal Directed  Orientation:  Full (Time, Place, and Person)  Thought Content:  Rumination  Suicidal Thoughts:  No  Homicidal Thoughts:  No  Memory:  Immediate;   Good Recent;   Good Remote;   Good  Judgement:   Fair  Insight:  Fair  Psychomotor Activity:  Normal  Concentration:  Fair  Recall:  Good  Fund of Knowledge:Good  Language: Good  Akathisia:  No  Handed:  Right  AIMS (if indicated):     Assets:  Communication Skills Desire for Improvement  Sleep:       Past Psychiatric History: Diagnosis:  Hospitalizations:  Outpatient Care:  Substance Abuse Care:  Self-Mutilation:  Suicidal Attempts:  Violent Behaviors:   Musculoskeletal: Strength & Muscle Tone: within normal limits Gait & Station: normal Patient leans: Right  DSM5:  Schizophrenia Disorders:  Schizophrenia (295.7) Obsessive-Compulsive Disorders:  Denies Trauma-Stressor Disorders:  Denies Substance/Addictive Disorders:  Alcohol Related Disorder - Moderate (303.90) Depressive Disorders:  denies  Axis Diagnosis:   AXIS I:  Alcohol Abuse, Substance Abuse and Substance Induced Mood Disorder AXIS II:  Deferred AXIS III:   Past Medical History  Diagnosis Date  . Diabetes mellitus without complication   . Hypertension   . Schizophrenia   . CHF (congestive heart failure)    AXIS IV:  other psychosocial or environmental problems AXIS V:  61-70 mild symptoms  Level of Care:  OP  Hospital Course:  Patient presented requesting help for his alcohol and drug problem.  Patient denies suicidal/homicidal ideation, psychosis, and paranoia.  Patient then states that he is ready to go home that he will go to [employer] and find a sponsor.    Consults:  psychiatry  Significant Diagnostic Studies:  labs: See below  Discharge Vitals:   Blood pressure 110/71, pulse 65, temperature 98 F (  36.7 C), temperature source Oral, resp. rate 19, SpO2 100.00%. There is no weight on file to calculate BMI. Lab Results:   Results for orders placed during the hospital encounter of 12/10/13 (from the past 72 hour(s))  CBC WITH DIFFERENTIAL     Status: Abnormal   Collection Time    12/10/13 12:49 AM      Result Value Ref Range   WBC 6.7   4.0 - 10.5 K/uL   RBC 4.27  4.22 - 5.81 MIL/uL   Hemoglobin 12.2 (*) 13.0 - 17.0 g/dL   HCT 37.5 (*) 39.0 - 52.0 %   MCV 87.8  78.0 - 100.0 fL   MCH 28.6  26.0 - 34.0 pg   MCHC 32.5  30.0 - 36.0 g/dL   RDW 13.0  11.5 - 15.5 %   Platelets 231  150 - 400 K/uL   Neutrophils Relative % 69  43 - 77 %   Neutro Abs 4.7  1.7 - 7.7 K/uL   Lymphocytes Relative 23  12 - 46 %   Lymphs Abs 1.5  0.7 - 4.0 K/uL   Monocytes Relative 8  3 - 12 %   Monocytes Absolute 0.5  0.1 - 1.0 K/uL   Eosinophils Relative 0  0 - 5 %   Eosinophils Absolute 0.0  0.0 - 0.7 K/uL   Basophils Relative 0  0 - 1 %   Basophils Absolute 0.0  0.0 - 0.1 K/uL  COMPREHENSIVE METABOLIC PANEL     Status: Abnormal   Collection Time    12/10/13 12:49 AM      Result Value Ref Range   Sodium 140  137 - 147 mEq/L   Potassium 3.4 (*) 3.7 - 5.3 mEq/L   Chloride 100  96 - 112 mEq/L   CO2 27  19 - 32 mEq/L   Glucose, Bld 157 (*) 70 - 99 mg/dL   BUN 47 (*) 6 - 23 mg/dL   Creatinine, Ser 1.79 (*) 0.50 - 1.35 mg/dL   Calcium 10.0  8.4 - 10.5 mg/dL   Total Protein 8.5 (*) 6.0 - 8.3 g/dL   Albumin 4.4  3.5 - 5.2 g/dL   AST 27  0 - 37 U/L   ALT 16  0 - 53 U/L   Alkaline Phosphatase 73  39 - 117 U/L   Total Bilirubin 1.0  0.3 - 1.2 mg/dL   GFR calc non Af Amer 42 (*) >90 mL/min   GFR calc Af Amer 49 (*) >90 mL/min   Comment: (NOTE)     The eGFR has been calculated using the CKD EPI equation.     This calculation has not been validated in all clinical situations.     eGFR's persistently <90 mL/min signify possible Chronic Kidney     Disease.  ETHANOL     Status: None   Collection Time    12/10/13 12:49 AM      Result Value Ref Range   Alcohol, Ethyl (B) <11  0 - 11 mg/dL   Comment:            LOWEST DETECTABLE LIMIT FOR     SERUM ALCOHOL IS 11 mg/dL     FOR MEDICAL PURPOSES ONLY  URINE RAPID DRUG SCREEN (HOSP PERFORMED)     Status: Abnormal   Collection Time    12/10/13  1:22 AM      Result Value Ref Range   Opiates NONE  DETECTED  NONE DETECTED   Cocaine POSITIVE (*)  NONE DETECTED   Benzodiazepines NONE DETECTED  NONE DETECTED   Amphetamines NONE DETECTED  NONE DETECTED   Tetrahydrocannabinol NONE DETECTED  NONE DETECTED   Barbiturates NONE DETECTED  NONE DETECTED   Comment:            DRUG SCREEN FOR MEDICAL PURPOSES     ONLY.  IF CONFIRMATION IS NEEDED     FOR ANY PURPOSE, NOTIFY LAB     WITHIN 5 DAYS.                LOWEST DETECTABLE LIMITS     FOR URINE DRUG SCREEN     Drug Class       Cutoff (ng/mL)     Amphetamine      1000     Barbiturate      200     Benzodiazepine   790     Tricyclics       240     Opiates          300     Cocaine          300     THC              50  CBG MONITORING, ED     Status: Abnormal   Collection Time    12/10/13  7:47 AM      Result Value Ref Range   Glucose-Capillary 137 (*) 70 - 99 mg/dL    Physical Findings: AIMS:  , ,  ,  ,    CIWA:    COWS:     Psychiatric Specialty Exam: See Psychiatric Specialty Exam and Suicide Risk Assessment completed by Attending Physician prior to discharge.    Demographic Factors:  Male    Mental Status Per Nursing Assessment::   On Admission:     Current Mental Status by Physician: Patient denies suicidal/homicidal ideation, psychosis, and paranoia  Loss Factors: NA  Historical Factors: NA  Risk Reduction Factors:   Seeking improvement  Continued Clinical Symptoms:  Alcohol/Substance Abuse/Dependencies  Cognitive Features That Contribute To Risk:  None noted    Suicide Risk:  Minimal: No identifiable suicidal ideation.  Patients presenting with no risk factors but with morbid ruminations; may be classified as minimal risk based on the severity of the depressive symptoms  Discharge Diagnoses:   AXIS I:  Alcohol Abuse AXIS II:  Deferred AXIS III:   Past Medical History  Diagnosis Date  . Diabetes mellitus without complication   . Hypertension   . Schizophrenia   . CHF (congestive heart failure)     AXIS IV:  other psychosocial or environmental problems AXIS V:  61-70 mild symptoms  Plan Of Care/Follow-up recommendations:  Activity:  Resume usual activity Diet:  Resume usual diet  Is patient on multiple antipsychotic therapies at discharge:  No   Has Patient had three or more failed trials of antipsychotic monotherapy by history:  No  Recommended Plan for Multiple Antipsychotic Therapies: NA    , , FNP-BC 12/10/2013, 12:26 PM    Discharge destination:  Other:  OP  Is patient on multiple antipsychotic therapies at discharge:  No   Has Patient had three or more failed trials of antipsychotic monotherapy by history:  No  Recommended Plan for Multiple Antipsychotic Therapies: NA  Discharge Instructions   Diet - low sodium heart healthy    Complete by:  As directed      Discharge instructions    Complete by:  As directed  Take all of you medications as prescribed by your mental healthcare provider.  Report any adverse effects and reactions from your medications to your outpatient provider promptly. Do not engage in alcohol and or illegal drug use while on prescription medicines. In the event of worsening symptoms call the crisis hotline, 911, and or go to the nearest emergency department for appropriate evaluation and treatment of symptoms. Follow-up with your primary care provider for your medical issues, concerns and or health care needs.   Keep all scheduled appointments.  If you are unable to keep an appointment call to reschedule.  Let the nurse know if you will need medications before next scheduled appointment.Take all of you medications as prescribed by your mental healthcare provider.  Report any adverse effects and reactions from your medications to your outpatient provider promptly. Do not engage in alcohol and or illegal drug use while on prescription medicines. In the event of worsening symptoms call the crisis hotline, 911, and or go to the nearest  emergency department for appropriate evaluation and treatment of symptoms. Follow-up with your primary care provider for your medical issues, concerns and or health care needs.   Keep all scheduled appointments.  If you are unable to keep an appointment call to reschedule.  Let the nurse know if you will need medications before next scheduled appointment.     Increase activity slowly    Complete by:  As directed             Medication List       Indication   ARIPiprazole 5 MG tablet  Commonly known as:  ABILIFY  Take 1 tablet (5 mg total) by mouth 2 (two) times daily after a meal.   Indication:  Schizophrenia     cloNIDine 0.2 MG tablet  Commonly known as:  CATAPRES  Take 1 tablet (0.2 mg total) by mouth 2 (two) times daily.   Indication:  High Blood Pressure     fluPHENAZine 5 MG tablet  Commonly known as:  PROLIXIN  Take 5 mg by mouth daily.      gabapentin 100 MG capsule  Commonly known as:  NEURONTIN  Take 2 capsules (200 mg total) by mouth 2 (two) times daily.   Indication:  Cocaine Dependence, Neuropathic pain     lisinopril 20 MG tablet  Commonly known as:  PRINIVIL,ZESTRIL  Take 1 tablet (20 mg total) by mouth daily. For high blood pressure.   Indication:  High Blood Pressure     metFORMIN 500 MG tablet  Commonly known as:  GLUCOPHAGE  Take 1 tablet (500 mg total) by mouth 2 (two) times daily with a meal.   Indication:  Type 2 Diabetes     traZODone 100 MG tablet  Commonly known as:  DESYREL  Take 1 tablet (100 mg total) by mouth at bedtime.   Indication:  Trouble Sleeping     trihexyphenidyl 5 MG tablet  Commonly known as:  ARTANE  Take 1 tablet (5 mg total) by mouth 2 (two) times daily with a meal.   Indication:  Extrapyramidal Reaction caused by Medications           Follow-up Information   Please follow up. (follow up with resources given)       Follow-up recommendations:  Activity:  Resume usual activity Diet:  Resume usual diet Other:   Follow up with resources given  Comments:  Patient discharged home to follow up with resources given for substance abuse assistance  Total Discharge  Time:  Greater than 30 minutes.  SignedEarleen Newport, FNP-BC 12/10/2013, 12:11 PM

## 2013-12-10 NOTE — ED Notes (Signed)
Pt states he is here for medical clearance  Pt states he is feeling suicidal  Pt states he needs to get off the "dope"  Pt states he is tired of doing it and it is messing up his life  Pt states he also wants his feet washed in warm soapy water

## 2013-12-10 NOTE — ED Notes (Signed)
Pt taking shower at present, meal given.

## 2013-12-10 NOTE — ED Provider Notes (Signed)
CSN: [id number]     Arrival date & time 12/10/13  0019 History   First MD Initiated Contact with Patient 12/10/13 0027     No chief complaint on file.    (Consider location/radiation/quality/duration/timing/severity/associated sxs/prior Treatment) HPI Comments: Patient presents to the emergency department for asking for help with his alcohol and drug problem.  He, states he is suicidal.  He has no specific plan.  He, states he is serious about getting up this time  The history is provided by the patient.    Past Medical History  Diagnosis Date  . Diabetes mellitus without complication   . Hypertension   . Schizophrenia   . CHF (congestive heart failure)    No past surgical history on file. No family history on file. History  Substance Use Topics  . Smoking status: Current Every Day Smoker -- 2.00 packs/day    Types: Cigarettes  . Smokeless tobacco: Current User  . Alcohol Use: No     Comment: pt states no ETOH use    Review of Systems  Constitutional: Negative for fever.  Musculoskeletal: Positive for myalgias.  Neurological: Negative for dizziness and headaches.  Psychiatric/Behavioral: Positive for suicidal ideas. The patient is nervous/anxious.   All other systems reviewed and are negative.     Allergies  Haldol  Home Medications   Prior to Admission medications   Medication Sig Start Date End Date Taking? Authorizing Provider  ARIPiprazole (ABILIFY MAINTENA) 400 MG SUSR Inject 400 mg into the muscle every 28 (twenty-eight) days. Last received in the hospital on 10/11/13 with next dose due in 28 days. 10/13/13   Fransisca KaufmannLaura Davis, NP  ARIPiprazole (ABILIFY) 5 MG tablet Take 1 tablet (5 mg total) by mouth 2 (two) times daily after a meal. 10/13/13   Fransisca KaufmannLaura Davis, NP  cloNIDine (CATAPRES) 0.2 MG tablet Take 1 tablet (0.2 mg total) by mouth 2 (two) times daily. 10/13/13   Fransisca KaufmannLaura Davis, NP  gabapentin (NEURONTIN) 100 MG capsule Take 2 capsules (200 mg total) by mouth 2 (two)  times daily. 10/13/13   Fransisca KaufmannLaura Davis, NP  lisinopril (PRINIVIL,ZESTRIL) 20 MG tablet Take 1 tablet (20 mg total) by mouth daily. For high blood pressure. 10/13/13   Fransisca KaufmannLaura Davis, NP  metFORMIN (GLUCOPHAGE) 500 MG tablet Take 1 tablet (500 mg total) by mouth 2 (two) times daily with a meal. 10/13/13   Fransisca KaufmannLaura Davis, NP  naproxen (NAPROSYN) 375 MG tablet Take 1 tablet (375 mg total) by mouth 2 (two) times daily with a meal. 10/13/13   Fransisca KaufmannLaura Davis, NP  traZODone (DESYREL) 100 MG tablet Take 1 tablet (100 mg total) by mouth at bedtime. 10/13/13   Fransisca KaufmannLaura Davis, NP  trihexyphenidyl (ARTANE) 5 MG tablet Take 1 tablet (5 mg total) by mouth 2 (two) times daily with a meal. 10/13/13   Fransisca KaufmannLaura Davis, NP   There were no vitals taken for this visit. Physical Exam  Vitals reviewed. Constitutional: He appears well-developed and well-nourished.  HENT:  Head: Normocephalic.  Eyes: Pupils are equal, round, and reactive to light.  Neck: Normal range of motion.  Cardiovascular: Normal rate.   Pulmonary/Chest: Effort normal.  Musculoskeletal: Normal range of motion.  Neurological: He is alert.  Skin: No erythema.  Chronic foot, irritation, and erythema of the soles, without breaks in the skin  Psychiatric: He has a normal mood and affect. His speech is normal and behavior is normal. He expresses inappropriate judgment. He expresses suicidal ideation. He expresses no suicidal plans.    ED Course  Procedures (including critical care time) Labs Review Labs Reviewed  CBC WITH DIFFERENTIAL  COMPREHENSIVE METABOLIC PANEL  ETHANOL  URINE RAPID DRUG SCREEN (HOSP PERFORMED)    Imaging Review No results found.   EKG Interpretation None      MDM  I requested screening labs, within; orders.  Will have patient moved to the stomach area for further evaluation Final diagnoses:  None         Arman FilterGail K Sami Froh, NP 12/10/13 0036

## 2013-12-10 NOTE — ED Notes (Signed)
Pt transferred from triage, presents with SI and requesting Detox from crack cocaine and alcohol. Denies SI at present, no HI, admits to hearing voices.  Pt reports he is tired of repeatedly going to jail and tired of abusing crack cocaine.  Pt reports he has not received his Abilify injection in 3 mos.  Pt is  Homeless and disheveled.  Pt calm & cooperative at present.

## 2013-12-10 NOTE — ED Provider Notes (Signed)
Medical screening examination/treatment/procedure(s) were performed by non-physician practitioner and as supervising physician I was immediately available for consultation/collaboration.   EKG Interpretation None       Olivia Mackielga M Marquette Blodgett, MD 12/10/13 [phone]

## 2013-12-10 NOTE — Discharge Instructions (Signed)
Finding Treatment for Alcohol and Drug Addiction It can be hard to find the right place to get professional treatment. Here are some important things to consider:  There are different types of treatment to choose from.  Some programs are live-in (residential) while others are not (outpatient). Sometimes a combination is offered.  No single type of program is right for everyone.  Most treatment programs involve a combination of education, counseling, and a 12-step, spiritually-based approach.  There are non-spiritually based programs (not 12-step).  Some treatment programs are government sponsored. They are geared for patients without private insurance.  Treatment programs can vary in many respects such as:  Cost and types of insurance accepted.  Types of on-site medical services offered.  Length of stay, setting, and size.  Overall philosophy of treatment. A person may need specialized treatment or have needs not addressed by all programs. For example, adolescents need treatment appropriate for their age. Other people have secondary disorders that must be managed as well. Secondary conditions can include mental illness, such as depression or diabetes. Often, a period of detoxification from alcohol or drugs is needed. This requires medical supervision and not all programs offer this. THINGS TO CONSIDER WHEN SELECTING A TREATMENT PROGRAM   Is the program certified by the appropriate government agency? Even private programs must be certified and employ certified professionals.  Does the program accept your insurance? If not, can a payment plan be set up?  Is the facility clean, organized, and well run? Do they allow you to speak with graduates who can share their treatment experience with you? Can you tour the facility? Can you meet with staff?  Does the program meet the full range of individual needs?  Does the treatment program address sexual orientation and physical disabilities?  Do they provide age, gender, and culturally appropriate treatment services?  Is treatment available in languages other than English?  Is long-term aftercare support or guidance encouraged and provided?  Is assessment of an individual's treatment plan ongoing to ensure it meets changing needs?  Does the program use strategies to encourage reluctant patients to remain in treatment long enough to increase the likelihood of success?  Does the program offer counseling (individual or group) and other behavioral therapies?  Does the program offer medicine as part of the treatment regimen, if needed?  Is there ongoing monitoring of possible relapse? Is there a defined relapse prevention program? Are services or referrals offered to family members to ensure they understand addiction and the recovery process? This would help them support the recovering individual.  Are 12-step meetings held at the center or is transport available for patients to attend outside meetings? In countries outside of the [place] and [place], Surveyor, minerals for contact information for services in your area. Document Released: 05/16/2005 Document Revised: 09/09/2011 Document Reviewed: 11/26/2007 [facility] Patient Information 2014 [place].  Chemical Dependency Chemical dependency is an addiction to drugs or alcohol. It is characterized by the repeated behavior of seeking out and using drugs and alcohol despite harmful consequences to the health and safety of ones self and others.  RISK FACTORS There are certain situations or behaviors that increase a person's risk for chemical dependency. These include:  A family history of chemical dependency.  A history of mental health issues, including depression and anxiety.  A home environment where drugs and alcohol are easily available to you.  Drug or alcohol use at a young age. SYMPTOMS  The following symptoms can indicate chemical dependency:  Inability to limit  the use of drugs or alcohol.  Nausea, sweating, shakiness, and anxiety that occurs when alcohol or drugs are not being used.  An increase in amount of drugs or alcohol that is necessary to get drunk or high. People who experience these symptoms can assess their use of drugs and alcohol by asking themselves the following questions:  Have you been told by friends or family that they are worried about your use of alcohol or drugs?  Do friends and family ever tell you about things you did while drinking alcohol or using drugs that you do not remember?  Do you lie about using alcohol or drugs or about the amounts you use?  Do you have difficulty completing daily tasks unless you use alcohol or drugs?  Is the level of your work or school performance lower because of your drug or alcohol use?  Do you get sick from using drugs or alcohol but keep using anyway?  Do you feel uncomfortable in social situations unless you use alcohol or drugs?  Do you use drugs or alcohol to help forget problems? An answer of yes to any of these questions may indicate chemical dependency. Professional evaluation is suggested. Document Released: 06/11/2001 Document Revised: 09/09/2011 Document Reviewed: 08/23/2010 [facility] Patient Information 2014 [place].  Finding Treatment for Alcohol and Drug Addiction It can be hard to find the right place to get professional treatment. Here are some important things to consider:  There are different types of treatment to choose from.  Some programs are live-in (residential) while others are not (outpatient). Sometimes a combination is offered.  No single type of program is right for everyone.  Most treatment programs involve a combination of education, counseling, and a 12-step, spiritually-based approach.  There are non-spiritually based programs (not 12-step).  Some treatment programs are government sponsored. They are geared for patients without private  insurance.  Treatment programs can vary in many respects such as:  Cost and types of insurance accepted.  Types of on-site medical services offered.  Length of stay, setting, and size.  Overall philosophy of treatment. A person may need specialized treatment or have needs not addressed by all programs. For example, adolescents need treatment appropriate for their age. Other people have secondary disorders that must be managed as well. Secondary conditions can include mental illness, such as depression or diabetes. Often, a period of detoxification from alcohol or drugs is needed. This requires medical supervision and not all programs offer this. THINGS TO CONSIDER WHEN SELECTING A TREATMENT PROGRAM   Is the program certified by the appropriate government agency? Even private programs must be certified and employ certified professionals.  Does the program accept your insurance? If not, can a payment plan be set up?  Is the facility clean, organized, and well run? Do they allow you to speak with graduates who can share their treatment experience with you? Can you tour the facility? Can you meet with staff?  Does the program meet the full range of individual needs?  Does the treatment program address sexual orientation and physical disabilities? Do they provide age, gender, and culturally appropriate treatment services?  Is treatment available in languages other than English?  Is long-term aftercare support or guidance encouraged and provided?  Is assessment of an individual's treatment plan ongoing to ensure it meets changing needs?  Does the program use strategies to encourage reluctant patients to remain in treatment long enough to increase the likelihood of success?  Does the program  offer counseling (individual or group) and other behavioral therapies?  Does the program offer medicine as part of the treatment regimen, if needed?  Is there ongoing monitoring of possible relapse?  Is there a defined relapse prevention program? Are services or referrals offered to family members to ensure they understand addiction and the recovery process? This would help them support the recovering individual.  Are 12-step meetings held at the center or is transport available for patients to attend outside meetings? In countries outside of the [place], Magazine features editorsee local directories for contact information for services in your area. Document Released: 05/16/2005 Document Revised: 09/09/2011 Document Reviewed: 11/26/2007 [facility] Patient Information 2014 [place], [place].
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
## 2013-12-10 NOTE — ED Notes (Signed)
Belongings include Shoes, belt and clothing.  Nothing of interest found.

## 2013-12-11 ENCOUNTER — Observation Stay (HOSPITAL_COMMUNITY)
Admission: EM | Admit: 2013-12-11 | Discharge: 2013-12-11 | Disposition: A | Discharge status: Home / Self Care | Payer: Medicare Other | Attending: Surgery | Admitting: Surgery

## 2013-12-11 ENCOUNTER — Emergency Department (HOSPITAL_COMMUNITY): Payer: Medicare Other

## 2013-12-11 ENCOUNTER — Encounter (HOSPITAL_COMMUNITY): Payer: Self-pay | Admitting: Radiology

## 2013-12-11 ENCOUNTER — Observation Stay (HOSPITAL_COMMUNITY): Payer: Medicare Other

## 2013-12-11 DIAGNOSIS — E119 Type 2 diabetes mellitus without complications: Secondary | ICD-10-CM | POA: Insufficient documentation

## 2013-12-11 DIAGNOSIS — Z1810 Retained metal fragments, unspecified: Secondary | ICD-10-CM | POA: Insufficient documentation

## 2013-12-11 DIAGNOSIS — S21109A Unspecified open wound of unspecified front wall of thorax without penetration into thoracic cavity, initial encounter: Secondary | ICD-10-CM

## 2013-12-11 DIAGNOSIS — W3400XA Accidental discharge from unspecified firearms or gun, initial encounter: Secondary | ICD-10-CM

## 2013-12-11 DIAGNOSIS — S21139A Puncture wound without foreign body of unspecified front wall of thorax without penetration into thoracic cavity, initial encounter: Secondary | ICD-10-CM

## 2013-12-11 DIAGNOSIS — J9819 Other pulmonary collapse: Secondary | ICD-10-CM | POA: Insufficient documentation

## 2013-12-11 DIAGNOSIS — F1410 Cocaine abuse, uncomplicated: Secondary | ICD-10-CM | POA: Insufficient documentation

## 2013-12-11 DIAGNOSIS — I10 Essential (primary) hypertension: Secondary | ICD-10-CM | POA: Insufficient documentation

## 2013-12-11 LAB — BASIC METABOLIC PANEL
BUN: 28 mg/dL — ABNORMAL HIGH (ref 6–23)
CHLORIDE: 103 meq/L (ref 96–112)
CO2: 23 mEq/L (ref 19–32)
CREATININE: 1.09 mg/dL (ref 0.50–1.35)
Calcium: 10.1 mg/dL (ref 8.4–10.5)
GFR calc Af Amer: 88 mL/min — ABNORMAL LOW (ref >90)
GFR calc non Af Amer: 76 mL/min — ABNORMAL LOW (ref >90)
Glucose, Bld: 144 mg/dL — ABNORMAL HIGH (ref 70–99)
POTASSIUM: 4 meq/L (ref 3.7–5.3)
Sodium: 142 mEq/L (ref 137–147)

## 2013-12-11 LAB — PREPARE FRESH FROZEN PLASMA
UNIT DIVISION: 0
Unit division: 0

## 2013-12-11 LAB — CBC
HCT: 33.1 % — ABNORMAL LOW (ref 39.0–52.0)
Hemoglobin: 10.7 g/dL — ABNORMAL LOW (ref 13.0–17.0)
MCH: 28.7 pg (ref 26.0–34.0)
MCHC: 32.3 g/dL (ref 30.0–36.0)
MCV: 88.7 fL (ref 78.0–100.0)
PLATELETS: 188 10*3/uL (ref 150–400)
RBC: 3.73 MIL/uL — ABNORMAL LOW (ref 4.22–5.81)
RDW: 12.9 % (ref 11.5–15.5)
WBC: 5.5 10*3/uL (ref 4.0–10.5)

## 2013-12-11 LAB — I-STAT CREATININE, ED: Creatinine, Ser: 1.2 mg/dL (ref 0.50–1.35)

## 2013-12-11 LAB — GLUCOSE, CAPILLARY
GLUCOSE-CAPILLARY: 183 mg/dL — AB (ref 70–99)
Glucose-Capillary: 124 mg/dL — ABNORMAL HIGH (ref 70–99)

## 2013-12-11 MED ORDER — MORPHINE SULFATE 4 MG/ML IJ SOLN
4.0000 mg | Freq: Once | INTRAMUSCULAR | Status: AC
  Administered 2013-12-11: 4 mg via INTRAVENOUS
  Filled 2013-12-11: qty 1

## 2013-12-11 MED ORDER — ONDANSETRON HCL 4 MG PO TABS: 4.0000 mg | ORAL_TABLET | Freq: Four times a day (QID) | ORAL | Status: DC | PRN

## 2013-12-11 MED ORDER — IOHEXOL 300 MG/ML  SOLN
80.0000 mL | Freq: Once | INTRAMUSCULAR | Status: AC | PRN
  Administered 2013-12-11: 80 mL via INTRAVENOUS

## 2013-12-11 MED ORDER — SODIUM CHLORIDE 0.9 % IV BOLUS (SEPSIS)
1000.0000 mL | Freq: Once | INTRAVENOUS | Status: AC
  Administered 2013-12-11: 1000 mL via INTRAVENOUS

## 2013-12-11 MED ORDER — SODIUM CHLORIDE 0.9 % IV SOLN
INTRAVENOUS | Status: DC
  Administered 2013-12-11: 07:00:00 via INTRAVENOUS

## 2013-12-11 MED ORDER — ACETAMINOPHEN 325 MG PO TABS
650.0000 mg | ORAL_TABLET | ORAL | Status: DC | PRN
  Administered 2013-12-11: 650 mg via ORAL
  Filled 2013-12-11: qty 2

## 2013-12-11 MED ORDER — ACETAMINOPHEN 325 MG PO TABS: 650.0000 mg | ORAL_TABLET | ORAL | Status: DC | PRN

## 2013-12-11 MED ORDER — ONDANSETRON HCL 4 MG/2ML IJ SOLN: 4.0000 mg | Freq: Four times a day (QID) | INTRAMUSCULAR | Status: DC | PRN

## 2013-12-11 MED ORDER — ONDANSETRON HCL 4 MG/2ML IJ SOLN
4.0000 mg | Freq: Once | INTRAMUSCULAR | Status: AC
  Administered 2013-12-11: 4 mg via INTRAVENOUS
  Filled 2013-12-11: qty 2

## 2013-12-11 MED ORDER — INSULIN ASPART 100 UNIT/ML ~~LOC~~ SOLN
0.0000 [IU] | Freq: Three times a day (TID) | SUBCUTANEOUS | Status: DC
  Administered 2013-12-11: 3 [IU] via SUBCUTANEOUS
  Administered 2013-12-11: 2 [IU] via SUBCUTANEOUS
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
## 2013-12-11 NOTE — Progress Notes (Signed)
Multiple request by patient to be discharged, explained to him that Xray result needs to be reviewed first but very insistent and stated that he will leave on his own.  PA made aware. Patient signed AMA form.

## 2013-12-11 NOTE — ED Notes (Signed)
No changes. Pt in CT.

## 2013-12-11 NOTE — ED Notes (Signed)
Pt alert, NAD, calm, interactive, resps e/u, speaking in clear complete sentences, skin W&D, following commands, no dyspnea noted, pt speaking with Drs. Lavella LemonsManly and [place]. [facility] [employer] present.
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
## 2013-12-11 NOTE — ED Notes (Signed)
Dr[facility] pt[facility]
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
## 2013-12-11 NOTE — Discharge Instructions (Signed)
Metformin and X-ray Contrast Studies °For some X-ray exams, a contrast dye is used. Contrast dye is a type of medicine used to make the X-ray image clearer. The contrast dye is given to the patient through a vein (intravenously). If you need to have this type of X-ray exam and you take a medication called metformin, your caregiver may have you stop taking metformin before the exam.  °LACTIC ACIDOSIS °In rare cases, a serious medical condition called lactic acidosis can develop in people who take metformin and receive contrast dye. The following conditions can increase the risk of this complication:  °· Kidney failure. °· Liver problems. °· Certain types of heart problems such as: °· Heart failure. °· Heart attack. °· Heart infection. °· Heart valve problems. °· Alcohol abuse. °If left untreated, lactic acidosis can lead to coma.  °SYMPTOMS OF LACTIC ACIDOSIS °Symptoms of lactic acidosis can include: °· Rapid breathing (hyperventilation). °· Neurologic symptoms such as: °· Headaches. °· Confusion. °· Dizziness. °· Excessive sweating. °· Feeling sick to your stomach (nauseous) or throwing up (vomiting). °AFTER THE X-RAY EXAM °· Stay well-hydrated. Drink fluids as instructed by your caregiver. °· If you have a risk of developing lactic acidosis, blood tests may be done to make sure your kidney function is okay. °· Metformin is usually stopped for 48 hours after the X-ray exam. Ask your caregiver when you can start taking metformin again. °SEEK MEDICAL CARE IF:  °· You have shortness of breath or difficulty breathing. °· You develop a headache that does not go away. °· You have nausea or vomiting. °· You urinate more than normal. °· You develop a skin rash and have: °· Redness. °· Swelling. °· Itching. °Document Released: 06/05/2009 Document Revised: 09/09/2011 Document Reviewed: 06/05/2009 °ExitCare® Patient Information ©2014 [employer]. ° °
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
## 2013-12-11 NOTE — ED Notes (Addendum)
Patient transported to CT at this time by Cletis AthensJon, RN.  Patient remains A&Ox4. No acute distress noted. Speaking in clear and complete sentences.

## 2013-12-11 NOTE — ED Notes (Signed)
Back from CT, no changes, alert, NAD, calm, interactive, no dyspnea noted, NSR on monitor, VSS, IVF hung/ infusing, pain and nausea meds given.
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
## 2013-12-11 NOTE — ED Notes (Signed)
[employer] at bedside 
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
## 2013-12-11 NOTE — Discharge Summary (Signed)
Patient seen, evaluated by me. Patient states he is back to baseline, will attend AA . He denies any SI, HI, psychosis. Patient can be discharged.

## 2013-12-12 ENCOUNTER — Encounter (HOSPITAL_COMMUNITY): Payer: Self-pay | Admitting: Emergency Medicine

## 2013-12-12 ENCOUNTER — Emergency Department (HOSPITAL_COMMUNITY)
Admission: EM | Admit: 2013-12-12 | Discharge: 2013-12-14 | Disposition: A | Discharge status: Home / Self Care | Payer: Medicare Other | Attending: Emergency Medicine | Admitting: Emergency Medicine

## 2013-12-12 ENCOUNTER — Emergency Department (HOSPITAL_COMMUNITY): Payer: Medicare Other

## 2013-12-12 DIAGNOSIS — I10 Essential (primary) hypertension: Secondary | ICD-10-CM | POA: Insufficient documentation

## 2013-12-12 DIAGNOSIS — I509 Heart failure, unspecified: Secondary | ICD-10-CM | POA: Insufficient documentation

## 2013-12-12 DIAGNOSIS — F32A Depression, unspecified: Secondary | ICD-10-CM

## 2013-12-12 DIAGNOSIS — F209 Schizophrenia, unspecified: Secondary | ICD-10-CM | POA: Insufficient documentation

## 2013-12-12 DIAGNOSIS — R4182 Altered mental status, unspecified: Secondary | ICD-10-CM | POA: Insufficient documentation

## 2013-12-12 DIAGNOSIS — E119 Type 2 diabetes mellitus without complications: Secondary | ICD-10-CM | POA: Insufficient documentation

## 2013-12-12 DIAGNOSIS — F29 Unspecified psychosis not due to a substance or known physiological condition: Secondary | ICD-10-CM

## 2013-12-12 DIAGNOSIS — F329 Major depressive disorder, single episode, unspecified: Secondary | ICD-10-CM

## 2013-12-12 DIAGNOSIS — F1420 Cocaine dependence, uncomplicated: Secondary | ICD-10-CM | POA: Insufficient documentation

## 2013-12-12 DIAGNOSIS — R45851 Suicidal ideations: Secondary | ICD-10-CM

## 2013-12-12 LAB — CBC WITH DIFFERENTIAL/PLATELET
Basophils Absolute: 0 10*3/uL (ref 0.0–0.1)
Basophils Relative: 0 % (ref 0–1)
Eosinophils Absolute: 0.1 10*3/uL (ref 0.0–0.7)
Eosinophils Relative: 1 % (ref 0–5)
HEMATOCRIT: 30.4 % — AB (ref 39.0–52.0)
Hemoglobin: 9.8 g/dL — ABNORMAL LOW (ref 13.0–17.0)
LYMPHS PCT: 13 % (ref 12–46)
Lymphs Abs: 1.3 10*3/uL (ref 0.7–4.0)
MCH: 29 pg (ref 26.0–34.0)
MCHC: 32.2 g/dL (ref 30.0–36.0)
MCV: 89.9 fL (ref 78.0–100.0)
MONO ABS: 0.5 10*3/uL (ref 0.1–1.0)
Monocytes Relative: 5 % (ref 3–12)
NEUTROS ABS: 7.6 10*3/uL (ref 1.7–7.7)
NEUTROS PCT: 81 % — AB (ref 43–77)
Platelets: 172 10*3/uL (ref 150–400)
RBC: 3.38 MIL/uL — ABNORMAL LOW (ref 4.22–5.81)
RDW: 13.2 % (ref 11.5–15.5)
WBC: 9.3 10*3/uL (ref 4.0–10.5)

## 2013-12-12 LAB — RAPID URINE DRUG SCREEN, HOSP PERFORMED
Amphetamines: NOT DETECTED
BENZODIAZEPINES: NOT DETECTED
Barbiturates: NOT DETECTED
Cocaine: POSITIVE — AB
Opiates: NOT DETECTED
Tetrahydrocannabinol: NOT DETECTED

## 2013-12-12 LAB — BASIC METABOLIC PANEL
BUN: 21 mg/dL (ref 6–23)
CHLORIDE: 108 meq/L (ref 96–112)
CO2: 27 meq/L (ref 19–32)
Calcium: 9.5 mg/dL (ref 8.4–10.5)
Creatinine, Ser: 1.12 mg/dL (ref 0.50–1.35)
GFR calc Af Amer: 86 mL/min — ABNORMAL LOW (ref >90)
GFR calc non Af Amer: 74 mL/min — ABNORMAL LOW (ref >90)
Glucose, Bld: 120 mg/dL — ABNORMAL HIGH (ref 70–99)
POTASSIUM: 4.2 meq/L (ref 3.7–5.3)
Sodium: 144 mEq/L (ref 137–147)

## 2013-12-12 LAB — URINALYSIS, ROUTINE W REFLEX MICROSCOPIC
Bilirubin Urine: NEGATIVE
GLUCOSE, UA: 250 mg/dL — AB
Hgb urine dipstick: NEGATIVE
Ketones, ur: NEGATIVE mg/dL
LEUKOCYTES UA: NEGATIVE
Nitrite: NEGATIVE
Protein, ur: NEGATIVE mg/dL
SPECIFIC GRAVITY, URINE: 1.028 (ref 1.005–1.030)
Urobilinogen, UA: 1 mg/dL (ref 0.0–1.0)
pH: 6 (ref 5.0–8.0)

## 2013-12-12 LAB — ETHANOL

## 2013-12-12 MED ORDER — LORAZEPAM 1 MG PO TABS
1.0000 mg | ORAL_TABLET | Freq: Three times a day (TID) | ORAL | Status: DC | PRN
Start: 2013-12-12 — End: 2013-12-14
  Administered 2013-12-12 – 2013-12-13 (×2): 1 mg via ORAL
  Filled 2013-12-12 (×2): qty 1

## 2013-12-12 MED ORDER — NICOTINE 21 MG/24HR TD PT24
21.0000 mg | MEDICATED_PATCH | Freq: Every day | TRANSDERMAL | Status: DC
  Administered 2013-12-13: 21 mg via TRANSDERMAL
  Filled 2013-12-12 (×2): qty 1

## 2013-12-12 MED ORDER — ALUM & MAG HYDROXIDE-SIMETH 200-200-20 MG/5ML PO SUSP: 30.0000 mL | ORAL | Status: DC | PRN

## 2013-12-12 MED ORDER — ONDANSETRON HCL 4 MG PO TABS: 4.0000 mg | ORAL_TABLET | Freq: Three times a day (TID) | ORAL | Status: DC | PRN

## 2013-12-12 MED ORDER — ACETAMINOPHEN 325 MG PO TABS
650.0000 mg | ORAL_TABLET | ORAL | Status: DC | PRN
  Administered 2013-12-12 – 2013-12-14 (×3): 650 mg via ORAL
  Filled 2013-12-12 (×3): qty 2

## 2013-12-12 MED ORDER — IBUPROFEN 200 MG PO TABS
600.0000 mg | ORAL_TABLET | Freq: Three times a day (TID) | ORAL | Status: DC | PRN
  Administered 2013-12-12: 600 mg via ORAL
  Filled 2013-12-12 (×2): qty 1

## 2013-12-12 MED ORDER — ZIPRASIDONE MESYLATE 20 MG IM SOLR
10.0000 mg | Freq: Once | INTRAMUSCULAR | Status: AC
  Administered 2013-12-12: 10 mg via INTRAMUSCULAR
  Filled 2013-12-12: qty 20

## 2013-12-12 MED ORDER — STERILE WATER FOR INJECTION IJ SOLN
INTRAMUSCULAR | Status: AC
  Filled 2013-12-12: qty 10

## 2013-12-12 NOTE — BH Assessment (Signed)
[facility] Assessment Progress Note  \ Spoke with Josh, PA-C to take history of pt and Rn to set up tele assessment for pt.  Pt will be seen as soon as RN can get the machine in the room.

## 2013-12-12 NOTE — ED Notes (Signed)
ED Security at bedside

## 2013-12-12 NOTE — ED Notes (Signed)
Pt standing naked in the doorway demanding to go to the shower. After asked multiple times by RN  To get back in bed pt does.
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
## 2013-12-12 NOTE — ED Notes (Signed)
Pt knows that urine is needed 

## 2013-12-12 NOTE — ED Notes (Signed)
Pt belongings inventoried and placed in storage room, inventory sheet completed
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
## 2013-12-12 NOTE — ED Notes (Signed)
Breakfast tray ordered 
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
## 2013-12-12 NOTE — ED Notes (Signed)
Patient transported to X-ray 

## 2013-12-12 NOTE — ED Provider Notes (Signed)
Medical screening examination/treatment/procedure(s) were conducted as a shared visit with non-physician practitioner(s) and myself.  I personally evaluated the patient during the encounter.   EKG Interpretation None       Doug SouSam Vidya Bamford, MD 12/12/13 1732

## 2013-12-12 NOTE — ED Notes (Signed)
Pt refused Nicotine patch.

## 2013-12-13 LAB — CBC
HCT: 31.9 % — ABNORMAL LOW (ref 39.0–52.0)
Hemoglobin: 10.2 g/dL — ABNORMAL LOW (ref 13.0–17.0)
MCH: 28.5 pg (ref 26.0–34.0)
MCHC: 32 g/dL (ref 30.0–36.0)
MCV: 89.1 fL (ref 78.0–100.0)
Platelets: 172 10*3/uL (ref 150–400)
RBC: 3.58 MIL/uL — ABNORMAL LOW (ref 4.22–5.81)
RDW: 12.9 % (ref 11.5–15.5)
WBC: 4.1 10*3/uL (ref 4.0–10.5)

## 2013-12-13 LAB — CBG MONITORING, ED
Glucose-Capillary: 119 mg/dL — ABNORMAL HIGH (ref 70–99)
Glucose-Capillary: 108 mg/dL — ABNORMAL HIGH (ref 70–99)
Glucose-Capillary: 144 mg/dL — ABNORMAL HIGH (ref 70–99)

## 2013-12-13 MED ORDER — LISINOPRIL 20 MG PO TABS
20.0000 mg | ORAL_TABLET | Freq: Every day | ORAL | Status: DC
  Administered 2013-12-14: 20 mg via ORAL
  Filled 2013-12-13: qty 1

## 2013-12-13 MED ORDER — TRAZODONE HCL 100 MG PO TABS
100.0000 mg | ORAL_TABLET | Freq: Every day | ORAL | Status: DC
  Administered 2013-12-13: 100 mg via ORAL
  Filled 2013-12-13: qty 1

## 2013-12-13 MED ORDER — METFORMIN HCL 500 MG PO TABS
500.0000 mg | ORAL_TABLET | Freq: Two times a day (BID) | ORAL | Status: DC
  Administered 2013-12-13 – 2013-12-14 (×2): 500 mg via ORAL
  Filled 2013-12-13 (×3): qty 1

## 2013-12-13 MED ORDER — GABAPENTIN 100 MG PO CAPS
200.0000 mg | ORAL_CAPSULE | Freq: Two times a day (BID) | ORAL | Status: DC
  Administered 2013-12-13 – 2013-12-14 (×2): 200 mg via ORAL
  Filled 2013-12-13 (×5): qty 2

## 2013-12-13 MED ORDER — ARIPIPRAZOLE 5 MG PO TABS
5.0000 mg | ORAL_TABLET | Freq: Every day | ORAL | Status: DC
  Administered 2013-12-14: 5 mg via ORAL
  Filled 2013-12-13 (×2): qty 1

## 2013-12-13 MED ORDER — CLONIDINE HCL 0.1 MG PO TABS
0.2000 mg | ORAL_TABLET | Freq: Two times a day (BID) | ORAL | Status: DC
  Administered 2013-12-13 – 2013-12-14 (×3): 0.2 mg via ORAL
  Filled 2013-12-13: qty 2
  Filled 2013-12-13: qty 1
  Filled 2013-12-13: qty 2

## 2013-12-13 NOTE — Progress Notes (Signed)
  CARE MANAGEMENT ED NOTE 12/13/2013  Patient:  [name],[name]   Account Number:  [id number]  Date Initiated:  12/13/2013  Documentation initiated by:  Radford PaxFERRERO,Martha Soltys  Subjective/Objective Assessment:   Patient presents to ED seeking help for his coccaine addiction     Subjective/Objective Assessment Detail:     Action/Plan:   Action/Plan Detail:   Anticipated DC Date:       Status Recommendation to Physician:   Result of Recommendation:    Other ED Services  Consult Working Plan    DC Planning Services  Other  PCP issues    Choice offered to / List presented to:            Status of service:  Completed, signed off  ED Comments:   ED Comments Detail:  EDCM spoke to patient at bedside regarding pcp issues. Patient with Medicare insurance without pcp.  [facility] with printed list of physicians who accept Medicare insurance within a five mile radius of patient's zip code [phone] as per patient.  Discussed importance of finding a pcp.  Patient verbalized understanding and thankful for resources.  No further EDCM needs at this time.

## 2013-12-13 NOTE — ED Notes (Signed)
Pt given apple juice  
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
## 2013-12-13 NOTE — BHH Counselor (Addendum)
[facility] - she will check to see if referral has been reviewed. Thayer OhmChris will call TTS back. Sarah at [facility] - MD hasn't reviewed referral yet. Hunt[facility] DecSara [facility] - there are no longer any beds available. She expects discharges tomorrow. Referral will be held in inquiry tracking file. [facility] - she didn't receive pt's referral. They have beds. Fax is [phone]. Writer faxed referral.  Evette Cristalaroline Paige Lincoln Ginley, LCSWA Assessment Counselor

## 2013-12-13 NOTE — ED Provider Notes (Signed)
Patient agrees to and [facility] the ED while awaiting disposition from behavioral health. 1640  Hurman HornJohn M Danon Lograsso, MD 12/13/13 [phone]
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
## 2013-12-13 NOTE — Progress Notes (Signed)
MHT initiated placement for inpatient treatment at the following hospitals:  1)[facility]-under review 2)[facility]-faxed referral 3)[facility]-no beds 4)[facility]-no beds 5)[facility]-faxed referral 6)[facility]-faxed referral 7)[facility]-faxed referral 8)[facility]-declined per Amor  Blain PaisMichelle L Jasir Rother, MHT/NS

## 2013-12-13 NOTE — ED Notes (Signed)
[facility]  CALLED  FOR  TRANSPORT   

## 2013-12-13 NOTE — ED Notes (Addendum)
Pt angry that there was no meat on tray, yelled at dietary rep who delivered the tray, came out of room went to social worker's room, was speaking loudly about "what kind of place gives you breakfast with no meat?" pt redirected to room, stressed to pt the inappropriateness of behavior. Explained that he needed to stay in room, not yell or talk inappropriate to staff, because he is here voluntarily. Requesting long term treatment for crack addiction. States only has been clean when he has been incarcerated.

## 2013-12-14 DIAGNOSIS — F1410 Cocaine abuse, uncomplicated: Secondary | ICD-10-CM

## 2013-12-14 DIAGNOSIS — F1994 Other psychoactive substance use, unspecified with psychoactive substance-induced mood disorder: Secondary | ICD-10-CM

## 2013-12-14 LAB — CBG MONITORING, ED: Glucose-Capillary: 144 mg/dL — ABNORMAL HIGH (ref 70–99)

## 2013-12-14 NOTE — BHH Suicide Risk Assessment (Signed)
Suicide Risk Assessment  Discharge Assessment     Demographic Factors:  Male and Low socioeconomic status  Total Time spent with patient: 45 minutes  Psychiatric Specialty Exam:     Blood pressure 150/75, pulse 60, temperature 97.9 F (36.6 C), temperature source Oral, resp. rate 18, height 5\' 9"  (1.753 m), weight 74.844 kg (165 lb), SpO2 97.00%.Body mass index is 24.36 kg/(m^2).  General Appearance: Fairly Groomed  Patent attorneyye Contact::  Good  Speech:  Clear and Coherent  Volume:  Normal  Mood:  Anxious  Affect:  Appropriate  Thought Process:  Coherent  Orientation:  Full (Time, Place, and Person)  Thought Content:  Negative  Suicidal Thoughts:  No  Homicidal Thoughts:  No  Memory:  Immediate;   Fair Recent;   Fair Remote;   Fair  Judgement:  Fair  Insight:  Lacking  Psychomotor Activity:  Normal  Concentration:  Good  Recall:  Good  Fund of Knowledge:Good  Language: Good  Akathisia:  Negative  Handed:  Right  AIMS (if indicated):     Assets:  Communication Skills  Sleep:       Musculoskeletal: Strength & Muscle Tone: within normal limits Gait & Station: normal Patient leans: N/A   Mental Status Per Nursing Assessment::   On Admission:     Current Mental Status by Physician: NA  Loss Factors: NA  Historical Factors: NA  Risk Reduction Factors:   NA  Continued Clinical Symptoms:  Alcohol/Substance Abuse/Dependencies  Cognitive Features That Contribute To Risk:  Closed-mindedness    Suicide Risk:  Minimal: No identifiable suicidal ideation.  Patients presenting with no risk factors but with morbid ruminations; may be classified as minimal risk based on the severity of the depressive symptoms  Discharge Diagnoses:   AXIS I:  Substance Induced Mood Disorder AXIS II:  Deferred AXIS III:   Past Medical History  Diagnosis Date  . Diabetes mellitus without complication   . Hypertension   . Schizophrenia   . CHF (congestive heart failure)    AXIS  IV:  chronic substance dependence AXIS V:  61-70 mild symptoms  Plan Of Care/Follow-up recommendations:  Activity:  resume usual activity Diet:  resume usual diet  Is patient on multiple antipsychotic therapies at discharge:  No   Has Patient had three or more failed trials of antipsychotic monotherapy by history:  No  Recommended Plan for Multiple Antipsychotic Therapies: NA    TAYLOR,GERALD D 12/14/2013, 11:38 AM

## 2013-12-14 NOTE — Progress Notes (Signed)
Per discussion with psychiatrist and np, patient psychiatricaly stable for dc home. CSW provided pt with outpatient resources including mobile crisis, na meetings. Pt provided with bus pass.   Byrd HesselbachKristen Reed, LCSW [phone]  ED CSW 12/14/2013 1122am

## 2013-12-14 NOTE — Progress Notes (Signed)
Pt was given information packet. 
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
## 2013-12-14 NOTE — ED Notes (Signed)
Pt upset and crying.  Pt asking for extra meal tray.  Pt refuses snacks and sandwiches offered to him.   Dr Ladona Ridgelaylor and Benedetto GoadShavan, NP talking weith patient at this time.  Will continue to monitor.
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
## 2013-12-14 NOTE — ED Notes (Addendum)
Pt aaox3.  Pt denies SI/HI/AH/VH at this time.  Pt calm and cooperative.  Will continue to monitor.

## 2013-12-15 ENCOUNTER — Encounter (HOSPITAL_COMMUNITY): Payer: Self-pay | Admitting: Emergency Medicine

## 2013-12-16 ENCOUNTER — Encounter (HOSPITAL_COMMUNITY): Payer: Self-pay | Admitting: Emergency Medicine

## 2013-12-16 ENCOUNTER — Emergency Department (HOSPITAL_COMMUNITY): Payer: Medicare Other

## 2013-12-16 ENCOUNTER — Emergency Department (HOSPITAL_COMMUNITY)
Admission: EM | Admit: 2013-12-16 | Discharge: 2013-12-16 | Disposition: A | Discharge status: Home / Self Care | Payer: Medicare Other | Attending: Emergency Medicine | Admitting: Emergency Medicine

## 2013-12-16 DIAGNOSIS — I10 Essential (primary) hypertension: Secondary | ICD-10-CM | POA: Insufficient documentation

## 2013-12-16 DIAGNOSIS — F200 Paranoid schizophrenia: Secondary | ICD-10-CM | POA: Insufficient documentation

## 2013-12-16 DIAGNOSIS — I509 Heart failure, unspecified: Secondary | ICD-10-CM | POA: Insufficient documentation

## 2013-12-16 DIAGNOSIS — Z79899 Other long term (current) drug therapy: Secondary | ICD-10-CM | POA: Insufficient documentation

## 2013-12-16 DIAGNOSIS — E119 Type 2 diabetes mellitus without complications: Secondary | ICD-10-CM | POA: Insufficient documentation

## 2013-12-16 DIAGNOSIS — F1410 Cocaine abuse, uncomplicated: Secondary | ICD-10-CM | POA: Insufficient documentation

## 2013-12-16 DIAGNOSIS — F17200 Nicotine dependence, unspecified, uncomplicated: Secondary | ICD-10-CM | POA: Insufficient documentation

## 2013-12-16 LAB — CBC WITH DIFFERENTIAL/PLATELET
Basophils Absolute: 0 10*3/uL (ref 0.0–0.1)
Basophils Relative: 0 % (ref 0–1)
Eosinophils Absolute: 0.1 10*3/uL (ref 0.0–0.7)
Eosinophils Relative: 2 % (ref 0–5)
HCT: 31.7 % — ABNORMAL LOW (ref 39.0–52.0)
HEMOGLOBIN: 10.4 g/dL — AB (ref 13.0–17.0)
LYMPHS ABS: 1.5 10*3/uL (ref 0.7–4.0)
Lymphocytes Relative: 19 % (ref 12–46)
MCH: 29.1 pg (ref 26.0–34.0)
MCHC: 32.8 g/dL (ref 30.0–36.0)
MCV: 88.8 fL (ref 78.0–100.0)
Monocytes Absolute: 0.5 10*3/uL (ref 0.1–1.0)
Monocytes Relative: 6 % (ref 3–12)
NEUTROS ABS: 5.8 10*3/uL (ref 1.7–7.7)
NEUTROS PCT: 73 % (ref 43–77)
PLATELETS: 208 10*3/uL (ref 150–400)
RBC: 3.57 MIL/uL — AB (ref 4.22–5.81)
RDW: 13.2 % (ref 11.5–15.5)
WBC: 7.9 10*3/uL (ref 4.0–10.5)

## 2013-12-16 LAB — COMPREHENSIVE METABOLIC PANEL
ALBUMIN: 3.5 g/dL (ref 3.5–5.2)
ALK PHOS: 66 U/L (ref 39–117)
ALT: 8 U/L (ref 0–53)
AST: 17 U/L (ref 0–37)
BILIRUBIN TOTAL: 0.6 mg/dL (ref 0.3–1.2)
BUN: 18 mg/dL (ref 6–23)
CHLORIDE: 101 meq/L (ref 96–112)
CO2: 25 mEq/L (ref 19–32)
Calcium: 9.5 mg/dL (ref 8.4–10.5)
Creatinine, Ser: 0.92 mg/dL (ref 0.50–1.35)
GFR calc Af Amer: >90 mL/min (ref >90)
GFR calc non Af Amer: >90 mL/min (ref >90)
Glucose, Bld: 149 mg/dL — ABNORMAL HIGH (ref 70–99)
POTASSIUM: 3.5 meq/L — AB (ref 3.7–5.3)
SODIUM: 140 meq/L (ref 137–147)
Total Protein: 6.6 g/dL (ref 6.0–8.3)

## 2013-12-16 LAB — RAPID URINE DRUG SCREEN, HOSP PERFORMED
AMPHETAMINES: NOT DETECTED
Barbiturates: NOT DETECTED
Benzodiazepines: NOT DETECTED
Cocaine: POSITIVE — AB
OPIATES: NOT DETECTED
TETRAHYDROCANNABINOL: NOT DETECTED

## 2013-12-16 LAB — ETHANOL

## 2013-12-16 LAB — CBG MONITORING, ED: Glucose-Capillary: 153 mg/dL — ABNORMAL HIGH (ref 70–99)

## 2013-12-16 NOTE — ED Notes (Signed)
Pt has in belonging bag: Blue jeans, brown belt, white shirt, purple sandals

## 2013-12-16 NOTE — ED Notes (Signed)
Pt continues to c/o abd pain, pt requesting food, attempts made to explain to patient he will need to see a physician to be able to eat while c/o abd pain

## 2013-12-16 NOTE — ED Provider Notes (Signed)
CSN: [id number]     Arrival date & time 12/16/13  0227 History   First MD Initiated Contact with Patient 12/16/13 0510     Chief Complaint  Patient presents with  . Suicidal    HPI Patient presents to the emergency department because of ongoing cocaine use and abuse.  He states despite multiple recent behavior health hospitalizations he continues have  desire to use cocaine.  He does not want to use cocaine any longer.  He has no suicidal or homicidal thoughts at this time.  He is requesting a sandwich.  He states compliant with his medications.  He has a history of diabetes, hypertension, schizophrenia, CHF   Past Medical History  Diagnosis Date  . Diabetes mellitus without complication   . Hypertension   . Schizophrenia   . CHF (congestive heart failure)    History reviewed. No pertinent past surgical history. Family History  Problem Relation Age of Onset  . Hypertension Other   . Diabetes Other    History  Substance Use Topics  . Smoking status: Current Every Day Smoker -- 2.00 packs/day    Types: Cigarettes  . Smokeless tobacco: Current User  . Alcohol Use: Yes     Comment: wine     Review of Systems  All other systems reviewed and are negative.     Allergies  Haldol and Haldol  Home Medications   Prior to Admission medications   Medication Sig Start Date End Date Taking? Authorizing Provider  acetaminophen (TYLENOL) 325 MG tablet Take 2 tablets (650 mg total) by mouth every 4 (four) hours as needed for mild pain, moderate pain or headache. 12/11/13  Yes Sherrie GeorgeWillard Jennings, PA-C  ARIPiprazole (ABILIFY) 5 MG tablet Take 1 tablet (5 mg total) by mouth 2 (two) times daily after a meal. 10/13/13  Yes Fransisca KaufmannLaura Davis, NP  cloNIDine (CATAPRES) 0.2 MG tablet Take 1 tablet (0.2 mg total) by mouth 2 (two) times daily. 10/13/13  Yes Fransisca KaufmannLaura Davis, NP  gabapentin (NEURONTIN) 100 MG capsule Take 2 capsules (200 mg total) by mouth 2 (two) times daily. 10/13/13  Yes Fransisca KaufmannLaura Davis, NP   lisinopril (PRINIVIL,ZESTRIL) 20 MG tablet Take 1 tablet (20 mg total) by mouth daily. For high blood pressure. 10/13/13  Yes Fransisca KaufmannLaura Davis, NP  metFORMIN (GLUCOPHAGE) 500 MG tablet Take 1 tablet (500 mg total) by mouth 2 (two) times daily with a meal. 10/13/13  Yes Fransisca KaufmannLaura Davis, NP  traZODone (DESYREL) 100 MG tablet Take 1 tablet (100 mg total) by mouth at bedtime. 10/13/13  Yes Fransisca KaufmannLaura Davis, NP  trihexyphenidyl (ARTANE) 5 MG tablet Take 1 tablet (5 mg total) by mouth 2 (two) times daily with a meal. 10/13/13   Fransisca KaufmannLaura Davis, NP   BP 152/79  Pulse 73  Temp(Src) 98.2 F (36.8 C) (Oral)  Resp 22  SpO2 100% Physical Exam  Nursing note and vitals reviewed. Constitutional: He is oriented to person, place, and time. He appears well-developed and well-nourished.  HENT:  Head: Normocephalic and atraumatic.  Eyes: EOM are normal.  Neck: Normal range of motion.  Cardiovascular: Normal rate, regular rhythm, normal heart sounds and intact distal pulses.   Pulmonary/Chest: Effort normal and breath sounds normal. No respiratory distress.  Abdominal: Soft. He exhibits no distension. There is no tenderness.  Musculoskeletal: Normal range of motion.  Neurological: He is alert and oriented to person, place, and time.  Skin: Skin is warm and dry.  Psychiatric: He has a normal mood and affect. His speech is normal  and behavior is normal. Judgment and thought content normal. He is not actively hallucinating. Cognition and memory are normal. He expresses no suicidal plans and no homicidal plans. He is attentive.    ED Course  Procedures (including critical care time) Labs Review Labs Reviewed  CBC WITH DIFFERENTIAL - Abnormal; Notable for the following:    RBC 3.57 (*)    Hemoglobin 10.4 (*)    HCT 31.7 (*)    All other components within normal limits  COMPREHENSIVE METABOLIC PANEL - Abnormal; Notable for the following:    Potassium 3.5 (*)    Glucose, Bld 149 (*)    All other components within normal  limits  URINE RAPID DRUG SCREEN (HOSP PERFORMED) - Abnormal; Notable for the following:    Cocaine POSITIVE (*)    All other components within normal limits  CBG MONITORING, ED - Abnormal; Notable for the following:    Glucose-Capillary 153 (*)    All other components within normal limits  ETHANOL    Imaging Review    EKG Interpretation None      MDM   Final diagnoses:  Paranoid schizophrenia, chronic condition    Patient is medically cleared at this time.  I do not believe that he needs acute psychiatric hospitalization/stabilization.  I offered to put the resource guide for the patient but he states he has a copy of this.  He'll make phone calls as an outpatient.    Lyanne CoKevin M Campos, MD 12/16/13 [phone]
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
## 2014-01-01 ENCOUNTER — Emergency Department (HOSPITAL_COMMUNITY)
Admission: EM | Admit: 2014-01-01 | Discharge: 2014-01-01 | Disposition: A | Discharge status: Home / Self Care | Payer: Medicare Other | Attending: Emergency Medicine | Admitting: Emergency Medicine

## 2014-01-01 ENCOUNTER — Encounter (HOSPITAL_COMMUNITY): Payer: Self-pay | Admitting: Emergency Medicine

## 2014-01-01 DIAGNOSIS — F1424 Cocaine dependence with cocaine-induced mood disorder: Secondary | ICD-10-CM

## 2014-01-01 DIAGNOSIS — Z888 Allergy status to other drugs, medicaments and biological substances status: Secondary | ICD-10-CM | POA: Insufficient documentation

## 2014-01-01 DIAGNOSIS — F1420 Cocaine dependence, uncomplicated: Secondary | ICD-10-CM | POA: Diagnosis present

## 2014-01-01 DIAGNOSIS — R4789 Other speech disturbances: Secondary | ICD-10-CM | POA: Insufficient documentation

## 2014-01-01 DIAGNOSIS — R45850 Homicidal ideations: Secondary | ICD-10-CM | POA: Insufficient documentation

## 2014-01-01 DIAGNOSIS — Z79899 Other long term (current) drug therapy: Secondary | ICD-10-CM | POA: Insufficient documentation

## 2014-01-01 DIAGNOSIS — E119 Type 2 diabetes mellitus without complications: Secondary | ICD-10-CM | POA: Insufficient documentation

## 2014-01-01 DIAGNOSIS — F1110 Opioid abuse, uncomplicated: Secondary | ICD-10-CM | POA: Insufficient documentation

## 2014-01-01 DIAGNOSIS — F200 Paranoid schizophrenia: Secondary | ICD-10-CM | POA: Diagnosis present

## 2014-01-01 DIAGNOSIS — F1999 Other psychoactive substance use, unspecified with unspecified psychoactive substance-induced disorder: Secondary | ICD-10-CM | POA: Insufficient documentation

## 2014-01-01 DIAGNOSIS — I509 Heart failure, unspecified: Secondary | ICD-10-CM | POA: Insufficient documentation

## 2014-01-01 DIAGNOSIS — F17200 Nicotine dependence, unspecified, uncomplicated: Secondary | ICD-10-CM | POA: Insufficient documentation

## 2014-01-01 LAB — CBC
HEMATOCRIT: 35.5 % — AB (ref 39.0–52.0)
Hemoglobin: 11.4 g/dL — ABNORMAL LOW (ref 13.0–17.0)
MCH: 28.8 pg (ref 26.0–34.0)
MCHC: 32.1 g/dL (ref 30.0–36.0)
MCV: 89.6 fL (ref 78.0–100.0)
PLATELETS: 192 10*3/uL (ref 150–400)
RBC: 3.96 MIL/uL — AB (ref 4.22–5.81)
RDW: 13.4 % (ref 11.5–15.5)
WBC: 5.7 10*3/uL (ref 4.0–10.5)

## 2014-01-01 LAB — COMPREHENSIVE METABOLIC PANEL
ALK PHOS: 87 U/L (ref 39–117)
ALT: 5 U/L (ref 0–53)
AST: 15 U/L (ref 0–37)
Albumin: 4.4 g/dL (ref 3.5–5.2)
Anion gap: 13 (ref 5–15)
BILIRUBIN TOTAL: 0.7 mg/dL (ref 0.3–1.2)
BUN: 20 mg/dL (ref 6–23)
CHLORIDE: 104 meq/L (ref 96–112)
CO2: 28 meq/L (ref 19–32)
Calcium: 10.4 mg/dL (ref 8.4–10.5)
Creatinine, Ser: 1.05 mg/dL (ref 0.50–1.35)
GFR calc Af Amer: >90 mL/min (ref >90)
GFR calc non Af Amer: 80 mL/min — ABNORMAL LOW (ref >90)
Glucose, Bld: 114 mg/dL — ABNORMAL HIGH (ref 70–99)
Potassium: 3.8 mEq/L (ref 3.7–5.3)
Sodium: 145 mEq/L (ref 137–147)
Total Protein: 7.8 g/dL (ref 6.0–8.3)

## 2014-01-01 LAB — SALICYLATE LEVEL: Salicylate Lvl: <2 mg/dL — ABNORMAL LOW (ref 2.8–20.0)

## 2014-01-01 LAB — ETHANOL: Alcohol, Ethyl (B): <11 mg/dL (ref 0–11)

## 2014-01-01 LAB — RAPID URINE DRUG SCREEN, HOSP PERFORMED
Amphetamines: NOT DETECTED
BARBITURATES: NOT DETECTED
BENZODIAZEPINES: NOT DETECTED
COCAINE: POSITIVE — AB
Opiates: NOT DETECTED
TETRAHYDROCANNABINOL: NOT DETECTED

## 2014-01-01 LAB — CBG MONITORING, ED: Glucose-Capillary: 126 mg/dL — ABNORMAL HIGH (ref 70–99)

## 2014-01-01 LAB — ACETAMINOPHEN LEVEL: Acetaminophen (Tylenol), Serum: <15 ug/mL (ref 10–30)

## 2014-01-01 MED ORDER — ZOLPIDEM TARTRATE 5 MG PO TABS: 5.0000 mg | ORAL_TABLET | Freq: Every evening | ORAL | Status: DC | PRN

## 2014-01-01 MED ORDER — METFORMIN HCL 500 MG PO TABS
500.0000 mg | ORAL_TABLET | Freq: Two times a day (BID) | ORAL | Status: DC
  Filled 2014-01-01 (×3): qty 1

## 2014-01-01 MED ORDER — LISINOPRIL 20 MG PO TABS
20.0000 mg | ORAL_TABLET | Freq: Every day | ORAL | Status: DC
  Administered 2014-01-01: 20 mg via ORAL
  Filled 2014-01-01: qty 1

## 2014-01-01 MED ORDER — NICOTINE 21 MG/24HR TD PT24
21.0000 mg | MEDICATED_PATCH | Freq: Every day | TRANSDERMAL | Status: DC
  Administered 2014-01-01: 21 mg via TRANSDERMAL
  Filled 2014-01-01: qty 1

## 2014-01-01 MED ORDER — LORAZEPAM 1 MG PO TABS: 1.0000 mg | ORAL_TABLET | Freq: Three times a day (TID) | ORAL | Status: DC | PRN

## 2014-01-01 MED ORDER — TRIHEXYPHENIDYL HCL 5 MG PO TABS
5.0000 mg | ORAL_TABLET | Freq: Two times a day (BID) | ORAL | Status: DC
  Administered 2014-01-01: 5 mg via ORAL
  Filled 2014-01-01 (×3): qty 1

## 2014-01-01 MED ORDER — IBUPROFEN 200 MG PO TABS
600.0000 mg | ORAL_TABLET | Freq: Three times a day (TID) | ORAL | Status: DC | PRN
  Administered 2014-01-01: 600 mg via ORAL
  Filled 2014-01-01: qty 3

## 2014-01-01 MED ORDER — TRAZODONE HCL 100 MG PO TABS
100.0000 mg | ORAL_TABLET | Freq: Every day | ORAL | Status: DC
Start: 2014-01-01 — End: 2014-01-01

## 2014-01-01 MED ORDER — ACETAMINOPHEN 500 MG PO TABS
1000.0000 mg | ORAL_TABLET | Freq: Once | ORAL | Status: AC
  Administered 2014-01-01: 1000 mg via ORAL
  Filled 2014-01-01: qty 2

## 2014-01-01 MED ORDER — ALUM & MAG HYDROXIDE-SIMETH 200-200-20 MG/5ML PO SUSP: 30.0000 mL | ORAL | Status: DC | PRN

## 2014-01-01 MED ORDER — CLONIDINE HCL 0.1 MG PO TABS
0.2000 mg | ORAL_TABLET | Freq: Two times a day (BID) | ORAL | Status: DC
  Administered 2014-01-01: 0.2 mg via ORAL
  Filled 2014-01-01: qty 2

## 2014-01-01 MED ORDER — ARIPIPRAZOLE 5 MG PO TABS
5.0000 mg | ORAL_TABLET | Freq: Two times a day (BID) | ORAL | Status: DC
  Administered 2014-01-01: 5 mg via ORAL
  Filled 2014-01-01 (×3): qty 1

## 2014-01-01 MED ORDER — GABAPENTIN 100 MG PO CAPS
200.0000 mg | ORAL_CAPSULE | Freq: Two times a day (BID) | ORAL | Status: DC
  Administered 2014-01-01: 200 mg via ORAL
  Filled 2014-01-01 (×2): qty 2

## 2014-01-01 MED ORDER — ONDANSETRON HCL 4 MG PO TABS
4.0000 mg | ORAL_TABLET | Freq: Three times a day (TID) | ORAL | Status: DC | PRN
  Administered 2014-01-01: 4 mg via ORAL
  Filled 2014-01-01: qty 1

## 2014-01-01 NOTE — [facility] Suicide Risk Assessment (Addendum)
Suicide Risk Assessment  Discharge Assessment     Demographic Factors:  Male and Unemployed  Total Time spent with patient: 20 minutes  Psychiatric Specialty Exam:      Blood pressure 133/83, pulse 96, temperature 98.3 F (36.8 C), resp. rate 18, weight 165 lb (74.844 kg), SpO2 100.00%.Body mass index is 24.36 kg/(m^2).   General Appearance: Casual   Eye Contact:: Good   Speech: Normal Rate   Volume: Normal   Mood: Euthymic   Affect: Congruent   Thought Process: Coherent   Orientation: Full (Time, Place, and Person)   Thought Content: WDL   Suicidal Thoughts: No   Homicidal Thoughts: No   Memory: Immediate; Good  Recent; Good  Remote; Good   Judgement: Fair   Insight: Fair   Psychomotor Activity: Normal   Concentration: Good   Recall: Good   Fund of Knowledge:Good   Language: Good   Akathisia: No   Handed: Right   AIMS (if indicated):   Assets: Health and safety inspectorinancial Resources/Insurance  Housing  Leisure Time  Resilience  Social Support  Transportation   Sleep:   Musculoskeletal:  Strength & Muscle Tone: within normal limits  Gait & Station: normal  Patient leans: N/A    Mental Status Per Nursing Assessment::   On Admission:   Cocaine use with voices  Current Mental Status by Physician: NA  Loss Factors: NA  Historical Factors: NA  Risk Reduction Factors:   Living with another person, especially a relative and Positive social support  Continued Clinical Symptoms:  Schizophrenia  Cognitive Features That Contribute To Risk:  None  Suicide Risk:  Minimal: No identifiable suicidal ideation.  Patients presenting with no risk factors but with morbid ruminations; may be classified as minimal risk based on the severity of the depressive symptoms  Discharge Diagnoses:   AXIS I:  Chronic Paranoid Schizophrenia AXIS II:  Deferred AXIS III:   Past Medical History  Diagnosis Date  . Diabetes mellitus without complication   . Hypertension   . Schizophrenia    . CHF (congestive heart failure)    AXIS IV:  other psychosocial or environmental problems AXIS V:  61-70 mild symptoms  Plan Of Care/Follow-up recommendations:  Activity:  as tolerated Diet:  low-sodium heart healthy diet  Is patient on multiple antipsychotic therapies at discharge:  No   Has Patient had three or more failed trials of antipsychotic monotherapy by history:  No  Recommended Plan for Multiple Antipsychotic Therapies: NA    Fabian Coca, PMH-NP 01/01/2014, 11:37 AM

## 2014-01-01 NOTE — ED Notes (Signed)
Alex CSW into see 
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
## 2014-01-01 NOTE — ED Notes (Signed)
Patient questioned this Clinical research associatewriter, "Do you know how old I am?" Patient stated, "I am 53 years old. I'm a little boy and I like little girls."
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
## 2014-01-01 NOTE — ED Notes (Signed)
Up tot he bathroom to shower and change scrubs 

## 2014-01-01 NOTE — ED Notes (Signed)
Patient provided with [place] in [place] and [facility] Guide as part of provided psychoeducational materials.

## 2014-01-01 NOTE — ED Notes (Addendum)
Written dc instructions reviewed w/ patient.  Pt encouraged to return/seek treatment for return of suicidal/ homicidal thoughts  or urges, follow up w/ his regular MD and take his medications as directed.  Pt verbalized understanding.  Pt ambulatory to dc window w/ mHt, belongings returned after leaving the unit.  Bus pass given.  Pt declined to take written dc instructions.

## 2014-01-01 NOTE — Consult Note (Signed)
[facility] Face-to-Face Psychiatry Consult   Reason for Consult:  Psychosis Referring Physician:  EDP  [name] is an 53 y.o. male. Total Time spent with patient: 20 minutes  Assessment: AXIS I:  Chronic Paranoid Schizophrenia AXIS II:  Cluster B Traits AXIS III:   Past Medical History  Diagnosis Date  . Diabetes mellitus without complication   . Hypertension   . Schizophrenia   . CHF (congestive heart failure)    AXIS IV:  economic problems, other psychosocial or environmental problems, problems related to social environment and problems with primary support group AXIS V:  61-70 mild symptoms  Plan:  No evidence of imminent risk to self or others at present.   Dr. Louretta Shorten assessed the patient and concurs with the plan.  Subjective:   [name] is a 53 y.o. male patient does not warrant admission.  HPI:  Patient was using crack last night and started hearing voices.  He is "clear" today and not hearing these voices.  [name] wants to discharge and [place], [name].  He plans to avoid cocaine, attend his NA meetings again, and return to his regular provider at [facility].  [name] states he just got of jail for two weeks for trespassing.  He feels the Prolixin they used did not work for him, needs his Abilify again, states it is effective for him.  [name] is seeking FL2 papers to go to an "[facility]."  His cousin will be contacted (permission obtained) to verify his information.  Denies suicidal/homicidal ideations and hallucinations.  HPI Elements:   Location:  generalized. Quality:  acute. Severity:  mild. Timing:  crack use. Duration:  brief. Context:  cocaine/crack use.  Past Psychiatric History: Past Medical History  Diagnosis Date  . Diabetes mellitus without complication   . Hypertension   . Schizophrenia   . CHF (congestive heart failure)     reports that he has been smoking  Cigarettes.  He has been smoking about 2.00 packs per day. He uses smokeless tobacco. He reports that he drinks alcohol. He reports that he uses illicit drugs ("Crack" cocaine and Cocaine) about 7 times per week. Family History  Problem Relation Age of Onset  . Hypertension Other   . Diabetes Other            Allergies:   Allergies  Allergen Reactions  . Haldol [Haloperidol] Other (See Comments)    Muscle spasms, loss of voluntary movement  . Haldol [Haloperidol]     ACT Assessment Complete:  Yes:    Educational Status    Risk to Self: Risk to self Is patient at risk for suicide?: No, but patient needs Medical Clearance Substance abuse history and/or treatment for substance abuse?: Yes  Risk to Others:    Abuse:    Prior Inpatient Therapy:    Prior Outpatient Therapy:    Additional Information:                    Objective: Blood pressure 133/83, pulse 96, temperature 98.3 F (36.8 C), resp. rate 18, weight 165 lb (74.844 kg), SpO2 100.00%.Body mass index is 24.36 kg/(m^2). Results for orders placed during the hospital encounter of 01/01/14 (from the past 72 hour(s))  ACETAMINOPHEN LEVEL     Status: None   Collection Time    01/01/14  4:54 AM      Result Value Ref Range   Acetaminophen (Tylenol), Serum <  15.0  10 - 30 ug/mL   Comment:            THERAPEUTIC CONCENTRATIONS VARY     SIGNIFICANTLY. A RANGE OF 10-30     ug/mL MAY BE AN EFFECTIVE     CONCENTRATION FOR MANY PATIENTS.     HOWEVER, SOME ARE BEST TREATED     AT CONCENTRATIONS OUTSIDE THIS     RANGE.     ACETAMINOPHEN CONCENTRATIONS     >150 ug/mL AT 4 HOURS AFTER     INGESTION AND >50 ug/mL AT 12     HOURS AFTER INGESTION ARE     OFTEN ASSOCIATED WITH TOXIC     REACTIONS.  CBC     Status: Abnormal   Collection Time    01/01/14  4:54 AM      Result Value Ref Range   WBC 5.7  4.0 - 10.5 K/uL   RBC 3.96 (*) 4.22 - 5.81 MIL/uL   Hemoglobin 11.4 (*) 13.0 - 17.0 g/dL   HCT 35.5 (*) 39.0 -  52.0 %   MCV 89.6  78.0 - 100.0 fL   MCH 28.8  26.0 - 34.0 pg   MCHC 32.1  30.0 - 36.0 g/dL   RDW 13.4  11.5 - 15.5 %   Platelets 192  150 - 400 K/uL  COMPREHENSIVE METABOLIC PANEL     Status: Abnormal   Collection Time    01/01/14  4:54 AM      Result Value Ref Range   Sodium 145  137 - 147 mEq/L   Potassium 3.8  3.7 - 5.3 mEq/L   Chloride 104  96 - 112 mEq/L   CO2 28  19 - 32 mEq/L   Glucose, Bld 114 (*) 70 - 99 mg/dL   BUN 20  6 - 23 mg/dL   Creatinine, Ser 1.05  0.50 - 1.35 mg/dL   Calcium 10.4  8.4 - 10.5 mg/dL   Total Protein 7.8  6.0 - 8.3 g/dL   Albumin 4.4  3.5 - 5.2 g/dL   AST 15  0 - 37 U/L   ALT 5  0 - 53 U/L   Alkaline Phosphatase 87  39 - 117 U/L   Total Bilirubin 0.7  0.3 - 1.2 mg/dL   GFR calc non Af Amer 80 (*) >90 mL/min   GFR calc Af Amer >90  >90 mL/min   Comment: (NOTE)     The eGFR has been calculated using the CKD EPI equation.     This calculation has not been validated in all clinical situations.     eGFR's persistently <90 mL/min signify possible Chronic Kidney     Disease.   Anion gap 13  5 - 15  ETHANOL     Status: None   Collection Time    01/01/14  4:54 AM      Result Value Ref Range   Alcohol, Ethyl (B) <11  0 - 11 mg/dL   Comment:            LOWEST DETECTABLE LIMIT FOR     SERUM ALCOHOL IS 11 mg/dL     FOR MEDICAL PURPOSES ONLY  SALICYLATE LEVEL     Status: Abnormal   Collection Time    01/01/14  4:54 AM      Result Value Ref Range   Salicylate Lvl <1.9 (*) 2.8 - 20.0 mg/dL  URINE RAPID DRUG SCREEN (HOSP PERFORMED)     Status: Abnormal   Collection Time  01/01/14  4:58 AM      Result Value Ref Range   Opiates NONE DETECTED  NONE DETECTED   Cocaine POSITIVE (*) NONE DETECTED   Benzodiazepines NONE DETECTED  NONE DETECTED   Amphetamines NONE DETECTED  NONE DETECTED   Tetrahydrocannabinol NONE DETECTED  NONE DETECTED   Barbiturates NONE DETECTED  NONE DETECTED   Comment:            DRUG SCREEN FOR MEDICAL PURPOSES     ONLY.  IF  CONFIRMATION IS NEEDED     FOR ANY PURPOSE, NOTIFY LAB     WITHIN 5 DAYS.                LOWEST DETECTABLE LIMITS     FOR URINE DRUG SCREEN     Drug Class       Cutoff (ng/mL)     Amphetamine      1000     Barbiturate      200     Benzodiazepine   993     Tricyclics       716     Opiates          300     Cocaine          300     THC              50   Labs are reviewed and are pertinent for no medical issues noted.  Current Facility-Administered Medications  Medication Dose Route Frequency Provider Last Rate Last Dose  . alum & mag hydroxide-simeth (MAALOX/MYLANTA) 200-200-20 MG/5ML suspension 30 mL  30 mL Oral PRN Illene Labrador, PA-C      . ibuprofen (ADVIL,MOTRIN) tablet 600 mg  600 mg Oral Q8H PRN Illene Labrador, PA-C   600 mg at 01/01/14 9678  . LORazepam (ATIVAN) tablet 1 mg  1 mg Oral Q8H PRN Illene Labrador, PA-C      . nicotine (NICODERM CQ - dosed in mg/24 hours) patch 21 mg  21 mg Transdermal Daily Robyn M Albert, PA-C      . ondansetron [facility]) tablet 4 mg  4 mg Oral Q8H PRN Illene Labrador, PA-C   4 mg at 01/01/14 9381  . zolpidem (AMBIEN) tablet 5 mg  5 mg Oral QHS PRN Illene Labrador, PA-C       Current Outpatient Prescriptions  Medication Sig Dispense Refill  . acetaminophen (TYLENOL) 325 MG tablet Take 2 tablets (650 mg total) by mouth every 4 (four) hours as needed for mild pain, moderate pain or headache.      . ARIPiprazole (ABILIFY) 5 MG tablet Take 1 tablet (5 mg total) by mouth 2 (two) times daily after a meal.  60 tablet  0  . cloNIDine (CATAPRES) 0.2 MG tablet Take 1 tablet (0.2 mg total) by mouth 2 (two) times daily.  60 tablet  0  . gabapentin (NEURONTIN) 100 MG capsule Take 2 capsules (200 mg total) by mouth 2 (two) times daily.  120 capsule  0  . lisinopril (PRINIVIL,ZESTRIL) 20 MG tablet Take 1 tablet (20 mg total) by mouth daily. For high blood pressure.  30 tablet  0  . metFORMIN (GLUCOPHAGE) 500 MG tablet Take 1 tablet (500 mg total) by mouth 2 (two)  times daily with a meal.  60 tablet  0  . traZODone (DESYREL) 100 MG tablet Take 1 tablet (100 mg total) by mouth at bedtime.  30 tablet  0  . trihexyphenidyl (  ARTANE) 5 MG tablet Take 1 tablet (5 mg total) by mouth 2 (two) times daily with a meal.  60 tablet  0    Psychiatric Specialty Exam:     Blood pressure 133/83, pulse 96, temperature 98.3 F (36.8 C), resp. rate 18, weight 165 lb (74.844 kg), SpO2 100.00%.Body mass index is 24.36 kg/(m^2).  General Appearance: Casual  Eye Contact::  Good  Speech:  Normal Rate  Volume:  Normal  Mood:  Euthymic  Affect:  Congruent  Thought Process:  Coherent  Orientation:  Full (Time, Place, and Person)  Thought Content:  WDL  Suicidal Thoughts:  No  Homicidal Thoughts:  No  Memory:  Immediate;   Good Recent;   Good Remote;   Good  Judgement:  Fair  Insight:  Fair  Psychomotor Activity:  Normal  Concentration:  Good  Recall:  Good  Fund of Knowledge:Good  Language: Good  Akathisia:  No  Handed:  Right  AIMS (if indicated):     Assets:  Catering manager Housing Leisure Time Resilience Social Support Transportation  Sleep:      Musculoskeletal: Strength & Muscle Tone: within normal limits Gait & Station: normal Patient leans: N/A  Treatment Plan Summary: Discharge home with follow-up at [place], [place] 01/01/2014 8:32 AM  Patient was seen for the psych evaluation, suicide risk assessment, discussed with physician extender and formulated treatment plan. Reviewed the information documented and agree with the treatment plan.  Zendaya Groseclose,JANARDHAHA R. 01/02/2014 12:50 PM

## 2014-01-01 NOTE — ED Notes (Signed)
Dr Shela CommonsJ and Catha Nottinghamjamison into see

## 2014-01-03 ENCOUNTER — Encounter (HOSPITAL_COMMUNITY): Payer: Self-pay | Admitting: Emergency Medicine

## 2014-01-03 ENCOUNTER — Emergency Department (HOSPITAL_COMMUNITY)
Admission: EM | Admit: 2014-01-03 | Discharge: 2014-01-03 | Disposition: A | Discharge status: Home / Self Care | Payer: Medicare Other | Attending: Emergency Medicine | Admitting: Emergency Medicine

## 2014-01-03 DIAGNOSIS — F1420 Cocaine dependence, uncomplicated: Secondary | ICD-10-CM | POA: Insufficient documentation

## 2014-01-03 DIAGNOSIS — M792 Neuralgia and neuritis, unspecified: Secondary | ICD-10-CM

## 2014-01-03 DIAGNOSIS — E119 Type 2 diabetes mellitus without complications: Secondary | ICD-10-CM | POA: Insufficient documentation

## 2014-01-03 DIAGNOSIS — I10 Essential (primary) hypertension: Secondary | ICD-10-CM | POA: Insufficient documentation

## 2014-01-03 DIAGNOSIS — F17200 Nicotine dependence, unspecified, uncomplicated: Secondary | ICD-10-CM | POA: Insufficient documentation

## 2014-01-03 DIAGNOSIS — I509 Heart failure, unspecified: Secondary | ICD-10-CM | POA: Insufficient documentation

## 2014-01-03 DIAGNOSIS — F209 Schizophrenia, unspecified: Secondary | ICD-10-CM | POA: Insufficient documentation

## 2014-01-03 DIAGNOSIS — IMO0002 Reserved for concepts with insufficient information to code with codable children: Secondary | ICD-10-CM | POA: Insufficient documentation

## 2014-01-03 DIAGNOSIS — Z79899 Other long term (current) drug therapy: Secondary | ICD-10-CM | POA: Insufficient documentation

## 2014-01-03 MED ORDER — ACETAMINOPHEN 500 MG PO TABS: 500.0000 mg | ORAL_TABLET | Freq: Four times a day (QID) | ORAL | Status: DC | PRN

## 2014-01-03 MED ORDER — ACETAMINOPHEN 325 MG PO TABS
650.0000 mg | ORAL_TABLET | Freq: Once | ORAL | Status: AC
  Administered 2014-01-03: 650 mg via ORAL
  Filled 2014-01-03: qty 2

## 2014-01-03 NOTE — ED Provider Notes (Signed)
CSN: [id number]     Arrival date & time 01/03/14  0113 History   First MD Initiated Contact with Patient 01/03/14 0256     Chief Complaint  Patient presents with  . Foot Pain  . Drug Problem    (Consider location/radiation/quality/duration/timing/severity/associated sxs/prior Treatment) HPI Comments: Patient is a 53 year old male with a history of hypertension, schizophrenia, and diabetes mellitus with neuropathy in his bilateral feet. Patient presenting today for worsening pain. Patient states that his pain is located in the plantar aspect of his feet and is worse when he is using cocaine for when he walks. Patient states that Tylenol improved his pain but he did not have any medications to take prior to arrival. Patient denies any new trauma or injury to his feet. Denies any numbness or pallor.  Patient also stating that he wishes to be detoxed from cocaine. He states he has been using crack cocaine since 1986. He has not followed up with programs provided in resource guide us patient states he does not have a phone to call them. Last cocaine use at 0100 today. Patient denies SI/HI.  Patient is a 53 y.o. male presenting with lower extremity pain and drug problem. The history is provided by the patient. No language interpreter was used.  Foot Pain This is a chronic problem. The current episode started more than 1 year ago. The problem occurs intermittently. The problem has been waxing and waning. Associated symptoms include arthralgias. Pertinent negatives include no fever, numbness or weakness. Exacerbated by: Crack cocaine use. He has tried nothing for the symptoms. Improvement on treatment: n/a.  Drug Problem Associated symptoms include arthralgias. Pertinent negatives include no fever, numbness or weakness.    Past Medical History  Diagnosis Date  . Diabetes mellitus without complication   . Hypertension   . Schizophrenia   . CHF (congestive heart failure)    History reviewed. No  pertinent past surgical history. Family History  Problem Relation Age of Onset  . Hypertension Other   . Diabetes Other    History  Substance Use Topics  . Smoking status: Current Every Day Smoker -- 2.00 packs/day    Types: Cigarettes  . Smokeless tobacco: Current User  . Alcohol Use: Yes     Comment: wine     Review of Systems  Constitutional: Negative for fever.  Musculoskeletal: Positive for arthralgias.  Neurological: Negative for weakness and numbness.  Psychiatric/Behavioral: Negative for suicidal ideas.  All other systems reviewed and are negative.    Allergies  Haldol and Haldol  Home Medications   Prior to Admission medications   Medication Sig Start Date End Date Taking? Authorizing Provider  acetaminophen (TYLENOL) 325 MG tablet Take 2 tablets (650 mg total) by mouth every 4 (four) hours as needed for mild pain, moderate pain or headache. 12/11/13  Yes Sherrie GeorgeWillard Jennings, PA-C  ARIPiprazole (ABILIFY) 5 MG tablet Take 1 tablet (5 mg total) by mouth 2 (two) times daily after a meal. 10/13/13  Yes Fransisca KaufmannLaura Davis, NP  cloNIDine (CATAPRES) 0.2 MG tablet Take 1 tablet (0.2 mg total) by mouth 2 (two) times daily. 10/13/13  Yes Fransisca KaufmannLaura Davis, NP  gabapentin (NEURONTIN) 100 MG capsule Take 2 capsules (200 mg total) by mouth 2 (two) times daily. 10/13/13  Yes Fransisca KaufmannLaura Davis, NP  lisinopril (PRINIVIL,ZESTRIL) 20 MG tablet Take 1 tablet (20 mg total) by mouth daily. For high blood pressure. 10/13/13  Yes Fransisca KaufmannLaura Davis, NP  metFORMIN (GLUCOPHAGE) 500 MG tablet Take 1 tablet (500 mg total) by  mouth 2 (two) times daily with a meal. 10/13/13  Yes Fransisca KaufmannLaura Davis, NP  traZODone (DESYREL) 100 MG tablet Take 1 tablet (100 mg total) by mouth at bedtime. 10/13/13  Yes Fransisca KaufmannLaura Davis, NP  trihexyphenidyl (ARTANE) 5 MG tablet Take 1 tablet (5 mg total) by mouth 2 (two) times daily with a meal. 10/13/13  Yes Fransisca KaufmannLaura Davis, NP  acetaminophen (TYLENOL) 500 MG tablet Take 1 tablet (500 mg total) by mouth every 6 (six)  hours as needed. 01/03/14   Antony MaduraKelly Camyah Pultz, PA-C   BP 157/91  Pulse 77  Temp(Src) 98.3 F (36.8 C) (Oral)  Resp 17  SpO2 100%  Physical Exam  Nursing note and vitals reviewed. Constitutional: He is oriented to person, place, and time. He appears well-developed and well-nourished. No distress.  Nontoxic/nonseptic appearing  HENT:  Head: Normocephalic and atraumatic.  Eyes: Conjunctivae and EOM are normal. No scleral icterus.  Neck: Normal range of motion.  Cardiovascular: Normal rate, regular rhythm and intact distal pulses.   DP and PT pulses 2+ bilaterally  Pulmonary/Chest: Effort normal. No respiratory distress. He has no wheezes.  Chest expansion symmetric  Musculoskeletal: Normal range of motion.  Neurological: He is alert and oriented to person, place, and time. He exhibits normal muscle tone. Coordination normal.  No gross sensory deficits appreciated. Patient able to wiggle all toes of bilateral feet.  Skin: Skin is warm and dry. No rash noted. He is not diaphoretic. No erythema. No pallor.  Psychiatric: His speech is rapid and/or pressured. He is agitated (Mild). He expresses no homicidal and no suicidal ideation. He expresses no suicidal plans and no homicidal plans.    ED Course  Procedures (including critical care time) Labs Review Labs Reviewed - No data to display  Imaging Review No results found.   EKG Interpretation None      MDM   Final diagnoses:  Neuropathic pain of foot, unspecified laterality  Cocaine dependence without complication    53 year old male presents to the emergency department for worsening bilateral foot pain. Pain is chronic in nature with no new trauma or injury. No evidence of infection on exam. Patient neurovascularly intact with no sensory deficits. Symptoms consistent with known neuropathy from diabetes. Pain managed in ED with Tylenol.  Patient also requesting detox from cocaine. Patient has a 30 year of cocaine use. He last used  crack cocaine at 0100. Patient seen for cocaine abuse on 01/01/2014 at which time he was evaluated by behavioral health provider. Outpatient resources given at this time which patient has not followed up with. No SI/HI or need for further behavioral health evaluation today. Patient will, again, be given a resource guide should he wish to seek help with his crack cocaine dependence. Return precautions provided. Patient discharged in good condition.   Filed Vitals:   01/03/14 0415 01/03/14 0500 01/03/14 0530 01/03/14 0600  BP:  144/81 140/83 157/91  Pulse:  80 84 77  Temp:      TempSrc:      Resp: 21 17 19 17   SpO2:  100% 100% 100%       Antony MaduraKelly Braleigh Massoud, PA-C 01/03/14 [phone]

## 2014-01-03 NOTE — Discharge Instructions (Signed)
Diabetic Neuropathy Diabetic neuropathy is a nerve disease or nerve damage that is caused by diabetes mellitus. About half of all people with diabetes mellitus have some form of nerve damage. Nerve damage is more common in those who have had diabetes mellitus for many years and who generally have not had good control of their blood sugar (glucose) level. Diabetic neuropathy is a common complication of diabetes mellitus. There are three more common types of diabetic neuropathy and a fourth type that is less common and less understood:   Peripheral neuropathy--This is the most common type of diabetic neuropathy. It causes damage to the nerves of the feet and legs first and then eventually the hands and arms.The damage affects the ability to sense touch.  Autonomic neuropathy--This type causes damage to the autonomic nervous system, which controls the following functions:  Heartbeat.  Body temperature.  Blood pressure.  Urination.  Digestion.  Sweating.  Sexual function.  Focal neuropathy--Focal neuropathy can be painful and unpredictable and occurs most often in older adults with diabetes mellitus. It involves a specific nerve or one area and often comes on suddenly. It usually does not cause long-term problems.  Radiculoplexus neuropathy-- Sometimes called lumbosacral radiculoplexus neuropathy, radiculoplexus neuropathy affects the nerves of the thighs, hips, buttocks, or legs. It is more common in people with type 2 diabetes mellitus and in older men. It is characterized by debilitating pain, weakness, and atrophy, usually in the thigh muscles. CAUSES  The cause of peripheral, autonomic, and focal neuropathies is diabetes mellitus that is uncontrolled and high glucose levels. The cause of radiculoplexus neuropathy is unknown. However, it is thought to be caused by inflammation related to uncontrolled glucose levels. SIGNS AND SYMPTOMS  Peripheral Neuropathy Peripheral neuropathy develops  slowly over time. When the nerves of the feet and legs no longer work there may be:   Burning, stabbing, or aching pain in the legs or feet.  Inability to feel pressure or pain in your feet. This can lead to:  Thick calluses over pressure areas.  Pressure sores.  Ulcers.  Foot deformities.  Reduced ability to feel temperature changes.  Muscle weakness. Autonomic Neuropathy The symptoms of autonomic neuropathy vary depending on which nerves are affected. Symptoms may include:  Problems with digestion, such as:  Feeling sick to your stomach (nausea).  Vomiting.  Bloating.  Constipation.  Diarrhea.  Abdominal pain.  Difficulty with urination. This occurs if you lose your ability to sense when your bladder is full. Problems include:  Urine leakage (incontinence).  Inability to empty your bladder completely (retention).  Rapid or irregular heartbeat (palpitations).  Blood pressure drops when you stand up (orthostatic hypotension). When you stand up you may feel:  Dizzy.  Weak.  Faint.  In men, inability to attain and maintain an erection.  In women, vaginal dryness and problems with decreased sexual desire and arousal.  Problems with body temperature regulation.  Increased or decreased sweating. Focal Neuropathy  Abnormal eye movements or abnormal alignment of both eyes.  Weakness in the wrist.  Foot drop. This results in an inability to lift the foot properly and abnormal walking or foot movement.  Paralysis on one side of your face (Bell palsy).  Chest or abdominal pain. Radiculoplexus Neuropathy  Sudden, severe pain in your hip, thigh, or buttocks.  Weakness and wasting of thigh muscles.  Difficulty rising from a seated position.  Abdominal swelling.  Unexplained weight loss (usually more than 10 lb [4.5 kg]). DIAGNOSIS  Peripheral Neuropathy Your senses may  be tested. Sensory function testing can be done with:  A light touch using a  monofilament.  A vibration with tuning fork.  A sharp sensation with a pin prick. Other tests that can help diagnose neuropathy are:  Nerve conduction velocity. This test checks the transmission of an electrical current through a nerve.  Electromyography. This shows how muscles respond to electrical signals transmitted by nearby nerves.  Quantitative sensory testing. This is used to assess how your nerves respond to vibrations and changes in temperature. Autonomic Neuropathy Diagnosis is often based on reported symptoms. Tell your health care provider if you experience:   Dizziness.   Constipation.   Diarrhea.   Inappropriate urination or inability to urinate.   Inability to get or maintain an erection.  Tests that may be done include:   Electrocardiography or Holter monitor. These are tests that can help show problems with the heart rate or heart rhythm.   An X-ray exam may be done. Focal Neuropathy Diagnosis is made based on your symptoms and what your health care provider finds during your exam. Other tests may be done. They may include:  Nerve conduction velocities. This checks the transmission of electrical current through a nerve.  Electromyography. This shows how muscles respond to electrical signals transmitted by nearby nerves.  Quantitative sensory testing. This test is used to assess how your nerves respond to vibration and changes in temperature. Radiculoplexus Neuropathy  Often the first thing is to eliminate any other issue or problems that might be the cause, as there is no stick test for diagnosis.  X-ray exam of your spine and lumbar region.  Spinal tap to rule out cancer.  MRI to rule out other lesions. TREATMENT  Once nerve damage occurs, it cannot be reversed. The goal of treatment is to keep the disease or nerve damage from getting worse and affecting more nerve fibers. Controlling your blood glucose level is the key. Most people with  radiculoplexus neuropathy see at least a partial improvement over time. You will need to keep your blood glucose and HbA1c levels in the target range determined by your health care provider. Things that help control blood glucose levels include:   Blood glucose monitoring.   Meal planning.   Physical activity.   Diabetes medicine.  Over time, maintaining lower blood glucose levels helps lessen symptoms. Sometimes, prescription pain medicine is needed. HOME CARE INSTRUCTIONS:  Do not smoke.  Keep your blood glucose level in the range that you and your health care provider have determined acceptable for you.  Keep your blood pressure level in the range that you and your health care provider have determined acceptable for you.  Eat a well-balanced diet.  Be active every day.  Check your feet every day. SEEK MEDICAL CARE IF:   You have burning, stabbing, or aching pain in the legs or feet.  You are unable to feel pressure or pain in your feet.  You develop problems with digestion such as:  Nausea.  Vomiting.  Bloating.  Constipation.  Diarrhea.  Abdominal pain.  You have difficulty with urination, such as:  Incontinence.  Retention.  You have palpitations.  You develop orthostatic hypotension. When you stand up you may feel:  Dizzy.  Weak.  Faint.  You cannot attain and maintain an erection (in men).  You have vaginal dryness and problems with decreased sexual desire and arousal (in women).  You have severe pain in your thighs, legs, or buttocks.  You have unexplained weight loss.  Document Released: 08/26/2001 Document Revised: 04/07/2013 Document Reviewed: 11/26/2012 [facility] Patient Information 2015 [place], [place]. This information is not intended to replace advice given to you by your health care provider. Make sure you discuss any questions you have with your health care provider.  Emergency Department Resource Guide 1) Find a Doctor and Pay  Out of Pocket Although you won't have to find out who is covered by your insurance plan, it is a good idea to ask around and get recommendations. You will then need to call the office and see if the doctor you have chosen will accept you as a new patient and what types of options they offer for patients who are self-pay. Some doctors offer discounts or will set up payment plans for their patients who do not have insurance, but you will need to ask so you aren't surprised when you get to your appointment.  2) Contact Your Local Health Department Not all health departments have doctors that can see patients for sick visits, but many do, so it is worth a call to see if yours does. If you don't know where your local health department is, you can check in your phone book. The CDC also has a tool to help you locate your state's health department, and many state websites also have listings of all of their local health departments.  3) Find a Walk-in Clinic If your illness is not likely to be very severe or complicated, you may want to try a walk in clinic. These are popping up all over the country in pharmacies, drugstores, and shopping centers. They're usually staffed by nurse practitioners or physician assistants that have been trained to treat common illnesses and complaints. They're usually fairly quick and inexpensive. However, if you have serious medical issues or chronic medical problems, these are probably not your best option.  No Primary Care Doctor: - Call Health Connect at  [phone] - they can help you locate a primary care doctor that  accepts your insurance, provides certain services, etc. - Physician Referral Service- [phone]  Chronic Pain Problems: Organization         Address  Phone   Notes  [facility]  [phone]) [phone] Patients need to be referred by their primary care doctor.   Medication Assistance: Organization         Address  Phone   Notes  [facility]  Medication [facility] [address] [place], [place] [address] [phone] --Must be a resident of [facility] [place] -- Must have NO insurance coverage whatsoever (no Medicaid/ Medicare, etc.) -- The pt. MUST have a primary care doctor that directs their care regularly and follows them in the community   MedAssist  [phone]   [employer]  [phone]    Agencies that provide inexpensive medical care: Organization         Address  Phone   Notes  [facility]  [phone]   [facility]    [phone]) [phone]   [facility] [address] [place], [place] [address] [phone]   [facility] [address], [place] [phone]   Planned Parenthood    [phone]   [facility]    [phone]   [facility]  [address], [place] Phone:  [phone], Fax:  [phone] Hours of Operation:  9 am - 6 pm, M-F.  Also accepts Medicaid/Medicare and self-pay.  °[facility] ° [address], [place] Phone: [phone], Fax: [phone]. Hours of Operation:  8:30 am - 5:30 pm, M-F.  Also accepts Medicaid and self-pay.  °[facility] [place] [address], [place] Phone: [phone]   °[facility] [address], [place], [place] [phone], Ext. 123 Mondays & Thursdays: 7-9 AM.  First 15 patients are seen on a first come, first serve basis. °  ° °Medicaid-accepting [place] Providers: ° °Organization         Address  Phone   Notes  °[facility] [address], [place] [phone] Also accepts self-pay patients.  °[facility] [address], [place] ° ([phone]) [phone]   °[facility] [address], [place] [phone]   °[facility] [address] [place]  [address], [place] [phone]) [phone]   °Veita Bland [address], [place]  ° [phone]) [phone] Only accepts [place] Access Medicaid patients after they have their name applied to their card.  ° °Self-Pay (no insurance) in [place]: ° °Organization         Address  Phone   Notes  °Sickle Cell Patients, [facility] [address], [place] [phone]   °[facility] [address], [place] [phone]) [phone]   °[facility] ° [address], [place] [phone]) [phone]   °[facility]/Dr. Osei-Bonsu ° [address] [place] [address], [place] or [address], [place] [phone] Phone number for both [place] and [place] locations is the same.  °[facility] [address], [place] [phone]   °[facility] [address] [place] [address], [place] or [address] [phone] °([phone]) [phone]   °[facility] [address], [place] [phone], phone; [phone], fax Sees patients 1st and 3rd Saturday of every month.  Must not qualify for public or private insurance (i.e. Medicaid, Medicare, [place] Health Choice, Veterans' Benefits) • Household income should be no more than 200% of the poverty level •The clinic cannot treat you if you are pregnant or think you are pregnant • Sexually transmitted diseases are not treated at the clinic.  ° ° °Dental Care: °Organization         Address  Phone  Notes  °[place] [facility] [address], [place] [phone]) [phone] Accepts children up to age 21 who are enrolled in Medicaid or [place] Health Choice; pregnant women with a Medicaid card; and children who have applied for Medicaid or [place] Health Choice, but were declined, whose parents can pay a reduced fee at time of service.  °[place] [facility] [place]  [address], [place]  ([phone]) [phone] Accepts children up to age 21 who are enrolled in Medicaid or [place] Health Choice; pregnant women with a Medicaid card; and children who have applied for Medicaid or  Health Choice, but were declined, whose parents can pay a reduced fee at time of service.  °[facility] ° [address],  ([phone]) [phone] Patients are seen by appointment only. Walk-ins are not accepted. [facility] will see patients 18 years of age and older. °Monday - Tuesday (  8am-5pm) Most Wednesdays (8:30-5pm) $30 per visit, cash only  [facility]  [address], [facility] [phone] Patients are seen by appointment only. Walk-ins are not accepted. [place] will see patients 39 years of age and older. One Wednesday Evening (Monthly: Volunteer Based).  $30 per visit, cash only  [place]  [phone] for adults; Children under age 47, call Graduate Pediatric Dentistry at [phone]. Children aged 21-14, please call [phone] to request a pediatric application.  Dental services are provided in all areas of dental care including fillings, crowns and bridges, complete and partial dentures, implants, gum treatment, root canals, and extractions. Preventive care is also provided. Treatment is provided to both adults and children. Patients are selected via a lottery and there is often a waiting list.   [facility] [address], [place]  [phone] [email or web address]   [facility] [address] [place], [place] [phone], Ext. 123 Second and Fourth Thursday of each month, opens at 6:30 AM; Clinic ends at 9 AM.  Patients are seen on a first-come first-served basis, and a limited number are seen during each clinic.   [facility]  [address] Hillard Danker [place], [place] [phone]   Eligibility Requirements You must have lived in [place], [place], or [place]  counties for at least the last three months.   You cannot be eligible for state or federal sponsored [employer], including [employer], [place], or [employer].   You generally cannot be eligible for healthcare insurance through your employer.    How to apply: Eligibility screenings are held every Tuesday and Wednesday afternoon from 1:00 pm until 4:00 pm. You do not need an appointment for the interview!  [facility] [address], [place], [place]   [place]  [place]  [place]  [phone]    Behavioral Health Resources in the Community: Intensive Outpatient Programs Organization         Address  Phone  Notes  [place]. [address] [place], [place] [phone]   [facility] Outpatient [address] [place], [place]   ADS: [facility] [address], [place], [place]   [place] [address],  [place], [place] or [phone]   Substance Abuse Resources Organization         Address  Phone  Notes  [facility]  [phone]) [phone]   [place]  [phone]   [facility] [place]   Chinita Pester  ([phone])[phone]   Residential & Outpatient Substance Abuse Program  [phone]   Psychological Services Organization         Address  Phone  Notes  [facility] [place]  [place]  [phone]   [place] [address] [place] or [phone]    Mobile Crisis Teams Organization         Address  Phone  Notes  [facility], Mobile Crisis Care Unit  [phone]   [facility]  [address] [place], [place]   [place] Levels [address] [place] [phone]    Self-Help/Support Groups Organization         Address  Phone  Notes  [facility] [place] - variety of support groups  [phone]- [phone] Call for more information  Narcotics Anonymous (NA), [facility] [address], [place]  2 meetings at this location   Statisticianesidential Treatment Programs Organization         Address  Phone  Notes  [facility] [address],    [place]  [phone]   [facility]  [address], [place] [phone], [place], [place] [phone]   [facility] [address], [place] [phone] Admissions: 8am-3pm M-F  [facility] [address]    [place], [place] [phone]   [facility] [address], [place], [place] [phone]   [facility] [address]  [place], [place] [phone]   [facility] - Intensive Outpatient [address], [place], [place] [phone]   [facility] ([facility] [address]  [place], [place] [phone] or [phone]   [facility] ([facility]) [address] [place], [place] [phone] Accepts Medicaid  [facility] [address]  [place] [phone] Substance Abuse/Addiction Treatment   [facility] Behavioral Health Resources Organization         Address  Phone  Notes  [facility]  [phone](888) [phone]   Angie FavaJulie Brannon, PhD [address], [address] [place], [place]   [phone]) [phone] or [phone]   [facility] [place]   [address] [place], [place] [phone](336) [phone]   [facility] [address], [place], [place] [phone] Insurance/Medicaid/sponsorship through [facility]  [facility] [address]                                    [place], [place] [phone] Therapy/tele-psych/case  [facility] [address].   [place], [place] [phone]) [phone]    Dr. Lolly MustacheArfeen  [phone](336)  [phone]   [facility] [place]  [employer] [facility] 1) [address], [place] 2) [address], [place] 3)  [address], [place] [phone](336) [phone]([phone]  [phone]([phone]   [facility] Child Abuse Hotline [phone]) [phone] or [phone]) [phone] (After Hours)

## 2014-01-03 NOTE — ED Notes (Signed)
The pt arrived by gems  From the street.  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

## 2014-01-05 ENCOUNTER — Emergency Department (HOSPITAL_COMMUNITY)
Admission: EM | Admit: 2014-01-05 | Discharge: 2014-01-05 | Disposition: A | Discharge status: Home / Self Care | Payer: Medicare Other | Attending: Emergency Medicine | Admitting: Emergency Medicine

## 2014-01-05 ENCOUNTER — Encounter (HOSPITAL_COMMUNITY): Payer: Self-pay | Admitting: Registered Nurse

## 2014-01-05 DIAGNOSIS — F329 Major depressive disorder, single episode, unspecified: Secondary | ICD-10-CM | POA: Insufficient documentation

## 2014-01-05 DIAGNOSIS — Z79899 Other long term (current) drug therapy: Secondary | ICD-10-CM | POA: Insufficient documentation

## 2014-01-05 DIAGNOSIS — R45851 Suicidal ideations: Secondary | ICD-10-CM

## 2014-01-05 DIAGNOSIS — F1910 Other psychoactive substance abuse, uncomplicated: Secondary | ICD-10-CM

## 2014-01-05 DIAGNOSIS — F3289 Other specified depressive episodes: Secondary | ICD-10-CM | POA: Insufficient documentation

## 2014-01-05 DIAGNOSIS — F209 Schizophrenia, unspecified: Secondary | ICD-10-CM | POA: Insufficient documentation

## 2014-01-05 DIAGNOSIS — I509 Heart failure, unspecified: Secondary | ICD-10-CM | POA: Insufficient documentation

## 2014-01-05 DIAGNOSIS — F1420 Cocaine dependence, uncomplicated: Secondary | ICD-10-CM | POA: Diagnosis present

## 2014-01-05 DIAGNOSIS — F1994 Other psychoactive substance use, unspecified with psychoactive substance-induced mood disorder: Secondary | ICD-10-CM

## 2014-01-05 DIAGNOSIS — E119 Type 2 diabetes mellitus without complications: Secondary | ICD-10-CM | POA: Insufficient documentation

## 2014-01-05 DIAGNOSIS — I10 Essential (primary) hypertension: Secondary | ICD-10-CM | POA: Insufficient documentation

## 2014-01-05 DIAGNOSIS — F17200 Nicotine dependence, unspecified, uncomplicated: Secondary | ICD-10-CM | POA: Insufficient documentation

## 2014-01-05 LAB — COMPREHENSIVE METABOLIC PANEL
ALT: 6 U/L (ref 0–53)
AST: 16 U/L (ref 0–37)
Albumin: 4.1 g/dL (ref 3.5–5.2)
Alkaline Phosphatase: 78 U/L (ref 39–117)
Anion gap: 11 (ref 5–15)
BUN: 16 mg/dL (ref 6–23)
CALCIUM: 10 mg/dL (ref 8.4–10.5)
CO2: 28 mEq/L (ref 19–32)
Chloride: 104 mEq/L (ref 96–112)
Creatinine, Ser: 0.97 mg/dL (ref 0.50–1.35)
GFR calc non Af Amer: >90 mL/min (ref >90)
Glucose, Bld: 168 mg/dL — ABNORMAL HIGH (ref 70–99)
Potassium: 3.2 mEq/L — ABNORMAL LOW (ref 3.7–5.3)
Sodium: 143 mEq/L (ref 137–147)
TOTAL PROTEIN: 7.4 g/dL (ref 6.0–8.3)
Total Bilirubin: 0.9 mg/dL (ref 0.3–1.2)

## 2014-01-05 LAB — CBC WITH DIFFERENTIAL/PLATELET
BASOS ABS: 0 10*3/uL (ref 0.0–0.1)
Basophils Relative: 0 % (ref 0–1)
EOS ABS: 0 10*3/uL (ref 0.0–0.7)
EOS PCT: 1 % (ref 0–5)
HCT: 34.3 % — ABNORMAL LOW (ref 39.0–52.0)
Hemoglobin: 11 g/dL — ABNORMAL LOW (ref 13.0–17.0)
Lymphocytes Relative: 26 % (ref 12–46)
Lymphs Abs: 1.4 10*3/uL (ref 0.7–4.0)
MCH: 29.1 pg (ref 26.0–34.0)
MCHC: 32.1 g/dL (ref 30.0–36.0)
MCV: 90.7 fL (ref 78.0–100.0)
Monocytes Absolute: 0.5 10*3/uL (ref 0.1–1.0)
Monocytes Relative: 9 % (ref 3–12)
Neutro Abs: 3.5 10*3/uL (ref 1.7–7.7)
Neutrophils Relative %: 64 % (ref 43–77)
PLATELETS: 177 10*3/uL (ref 150–400)
RBC: 3.78 MIL/uL — AB (ref 4.22–5.81)
RDW: 13.4 % (ref 11.5–15.5)
WBC: 5.4 10*3/uL (ref 4.0–10.5)

## 2014-01-05 LAB — RAPID URINE DRUG SCREEN, HOSP PERFORMED
AMPHETAMINES: NOT DETECTED
BARBITURATES: NOT DETECTED
BENZODIAZEPINES: NOT DETECTED
COCAINE: POSITIVE — AB
Opiates: NOT DETECTED
Tetrahydrocannabinol: NOT DETECTED

## 2014-01-05 LAB — ETHANOL

## 2014-01-05 MED ORDER — ACETAMINOPHEN 325 MG PO TABS
650.0000 mg | ORAL_TABLET | ORAL | Status: DC | PRN
  Administered 2014-01-05 (×2): 650 mg via ORAL
  Filled 2014-01-05 (×2): qty 2

## 2014-01-05 MED ORDER — GABAPENTIN 100 MG PO CAPS
200.0000 mg | ORAL_CAPSULE | Freq: Two times a day (BID) | ORAL | Status: DC
  Administered 2014-01-05: 200 mg via ORAL
  Filled 2014-01-05 (×2): qty 2

## 2014-01-05 MED ORDER — POTASSIUM CHLORIDE CRYS ER 20 MEQ PO TBCR
40.0000 meq | EXTENDED_RELEASE_TABLET | Freq: Once | ORAL | Status: AC
  Administered 2014-01-05: 40 meq via ORAL
  Filled 2014-01-05: qty 2

## 2014-01-05 MED ORDER — LISINOPRIL 20 MG PO TABS
20.0000 mg | ORAL_TABLET | Freq: Every day | ORAL | Status: DC
Start: 2014-01-05 — End: 2014-01-05
  Administered 2014-01-05: 20 mg via ORAL
  Filled 2014-01-05: qty 1

## 2014-01-05 MED ORDER — TRAZODONE HCL 100 MG PO TABS: 100.0000 mg | ORAL_TABLET | Freq: Every day | ORAL | Status: DC

## 2014-01-05 MED ORDER — ONDANSETRON HCL 4 MG PO TABS: 4.0000 mg | ORAL_TABLET | Freq: Three times a day (TID) | ORAL | Status: DC | PRN

## 2014-01-05 MED ORDER — TRIHEXYPHENIDYL HCL 5 MG PO TABS
5.0000 mg | ORAL_TABLET | Freq: Two times a day (BID) | ORAL | Status: DC
  Filled 2014-01-05 (×2): qty 1

## 2014-01-05 MED ORDER — ZOLPIDEM TARTRATE 5 MG PO TABS: 5.0000 mg | ORAL_TABLET | Freq: Every evening | ORAL | Status: DC | PRN

## 2014-01-05 MED ORDER — POTASSIUM CHLORIDE CRYS ER 20 MEQ PO TBCR
10.0000 meq | EXTENDED_RELEASE_TABLET | Freq: Every day | ORAL | Status: DC
  Administered 2014-01-05: 10 meq via ORAL
  Filled 2014-01-05: qty 0.5

## 2014-01-05 MED ORDER — METFORMIN HCL 500 MG PO TABS
500.0000 mg | ORAL_TABLET | Freq: Two times a day (BID) | ORAL | Status: DC
  Filled 2014-01-05 (×2): qty 1

## 2014-01-05 MED ORDER — ARIPIPRAZOLE 5 MG PO TABS
5.0000 mg | ORAL_TABLET | Freq: Two times a day (BID) | ORAL | Status: DC
  Filled 2014-01-05 (×2): qty 1

## 2014-01-05 MED ORDER — NICOTINE 21 MG/24HR TD PT24
21.0000 mg | MEDICATED_PATCH | Freq: Every day | TRANSDERMAL | Status: DC
  Administered 2014-01-05: 21 mg via TRANSDERMAL
  Filled 2014-01-05: qty 1

## 2014-01-05 MED ORDER — CLONIDINE HCL 0.1 MG PO TABS
0.2000 mg | ORAL_TABLET | Freq: Two times a day (BID) | ORAL | Status: DC
  Administered 2014-01-05: 0.2 mg via ORAL
  Filled 2014-01-05: qty 2

## 2014-01-05 NOTE — ED Notes (Signed)
Pt d/c with a bus voucher. D/c instructions given. Pt reports that he plans to stay with a friend.

## 2014-01-05 NOTE — Discharge Instructions (Signed)
Finding Treatment for Alcohol and Drug Addiction It can be hard to find the right place to get professional treatment. Here are some important things to consider:  There are different types of treatment to choose from.  Some programs are live-in (residential) while others are not (outpatient). Sometimes a combination is offered.  No single type of program is right for everyone.  Most treatment programs involve a combination of education, counseling, and a 12-step, spiritually-based approach.  There are non-spiritually based programs (not 12-step).  Some treatment programs are government sponsored. They are geared for patients without private insurance.  Treatment programs can vary in many respects such as:  Cost and types of insurance accepted.  Types of on-site medical services offered.  Length of stay, setting, and size.  Overall philosophy of treatment. A person may need specialized treatment or have needs not addressed by all programs. For example, adolescents need treatment appropriate for their age. Other people have secondary disorders that must be managed as well. Secondary conditions can include mental illness, such as depression or diabetes. Often, a period of detoxification from alcohol or drugs is needed. This requires medical supervision and not all programs offer this. THINGS TO CONSIDER WHEN SELECTING A TREATMENT PROGRAM   Is the program certified by the appropriate government agency? Even private programs must be certified and employ certified professionals.  Does the program accept your insurance? If not, can a payment plan be set up?  Is the facility clean, organized, and well run? Do they allow you to speak with graduates who can share their treatment experience with you? Can you tour the facility? Can you meet with staff?  Does the program meet the full range of individual needs?  Does the treatment program address sexual orientation and physical disabilities?  Do they provide age, gender, and culturally appropriate treatment services?  Is treatment available in languages other than English?  Is long-term aftercare support or guidance encouraged and provided?  Is assessment of an individual's treatment plan ongoing to ensure it meets changing needs?  Does the program use strategies to encourage reluctant patients to remain in treatment long enough to increase the likelihood of success?  Does the program offer counseling (individual or group) and other behavioral therapies?  Does the program offer medicine as part of the treatment regimen, if needed?  Is there ongoing monitoring of possible relapse? Is there a defined relapse prevention program? Are services or referrals offered to family members to ensure they understand addiction and the recovery process? This would help them support the recovering individual.  Are 12-step meetings held at the center or is transport available for patients to attend outside meetings? In countries outside of the [place], Magazine features editorsee local directories for contact information for services in your area. Document Released: 05/16/2005 Document Revised: 09/09/2011 Document Reviewed: 11/26/2007 [facility] Patient Information 2015 [place], [place]. This information is not intended to replace advice given to you by your health care provider. Make sure you discuss any questions you have with your health care provider.  Depression, Adult Depression refers to feeling sad, low, down in the dumps, blue, gloomy, or empty. In general, there are two kinds of depression: 1. Depression that we all experience from time to time because of upsetting life experiences, including the loss of a job or the ending of a relationship (normal sadness or normal grief). This kind of depression is considered normal, is short lived, and resolves within a few days to 2 weeks. (Depression experienced after the  loss of a loved one is called bereavement.  Bereavement often lasts longer than 2 weeks but normally gets better with time.) 2. Clinical depression, which lasts longer than normal sadness or normal grief or interferes with your ability to function at home, at work, and in school. It also interferes with your personal relationships. It affects almost every aspect of your life. Clinical depression is an illness. Symptoms of depression also can be caused by conditions other than normal sadness and grief or clinical depression. Examples of these conditions are listed as follows:  Physical illness--Some physical illnesses, including underactive thyroid gland (hypothyroidism), severe anemia, specific types of cancer, diabetes, uncontrolled seizures, heart and lung problems, strokes, and chronic pain are commonly associated with symptoms of depression.  Side effects of some prescription medicine--In some people, certain types of prescription medicine can cause symptoms of depression.  Substance abuse--Abuse of alcohol and illicit drugs can cause symptoms of depression. SYMPTOMS Symptoms of normal sadness and normal grief include the following:  Feeling sad or crying for short periods of time.  Not caring about anything (apathy).  Difficulty sleeping or sleeping too much.  No longer able to enjoy the things you used to enjoy.  Desire to be by oneself all the time (social isolation).  Lack of energy or motivation.  Difficulty concentrating or remembering.  Change in appetite or weight.  Restlessness or agitation. Symptoms of clinical depression include the same symptoms of normal sadness or normal grief and also the following symptoms:  Feeling sad or crying all the time.  Feelings of guilt or worthlessness.  Feelings of hopelessness or helplessness.  Thoughts of suicide or the desire to harm yourself (suicidal ideation).  Loss of touch with reality (psychotic symptoms). Seeing or hearing things that are not real (hallucinations)  or having false beliefs about your life or the people around you (delusions and paranoia). DIAGNOSIS  The diagnosis of clinical depression usually is based on the severity and duration of the symptoms. Your caregiver also will ask you questions about your medical history and substance use to find out if physical illness, use of prescription medicine, or substance abuse is causing your depression. Your caregiver also may order blood tests. TREATMENT  Typically, normal sadness and normal grief do not require treatment. However, sometimes antidepressant medicine is prescribed for bereavement to ease the depressive symptoms until they resolve. The treatment for clinical depression depends on the severity of your symptoms but typically includes antidepressant medicine, counseling with a mental health professional, or a combination of both. Your caregiver will help to determine what treatment is best for you. Depression caused by physical illness usually goes away with appropriate medical treatment of the illness. If prescription medicine is causing depression, talk with your caregiver about stopping the medicine, decreasing the dose, or substituting another medicine. Depression caused by abuse of alcohol or illicit drugs abuse goes away with abstinence from these substances. Some adults need professional help in order to stop drinking or using drugs. SEEK IMMEDIATE CARE IF:  You have thoughts about hurting yourself or others.  You lose touch with reality (have psychotic symptoms).  You are taking medicine for depression and have a serious side effect. FOR MORE INFORMATION National Alliance on Mental Illness: www.nami.[employer]org National Institute of Mental Health: http://www.maynard.net/www.nimh.nih.gov Document Released: 06/14/2000 Document Revised: 12/17/2011 Document Reviewed: 09/16/2011 [facility] Patient Information 2015 [place], [place]. This information is not intended to replace advice given to you by your health care  provider. Make sure you discuss any questions  you have with your health care provider.  Chemical Dependency Chemical dependency is an addiction to drugs or alcohol. It is characterized by the repeated behavior of seeking out and using drugs and alcohol despite harmful consequences to the health and safety of ones self and others.  RISK FACTORS There are certain situations or behaviors that increase a person's risk for chemical dependency. These include:  A family history of chemical dependency.  A history of mental health issues, including depression and anxiety.  A home environment where drugs and alcohol are easily available to you.  Drug or alcohol use at a young age. SYMPTOMS  The following symptoms can indicate chemical dependency:  Inability to limit the use of drugs or alcohol.  Nausea, sweating, shakiness, and anxiety that occurs when alcohol or drugs are not being used.  An increase in amount of drugs or alcohol that is necessary to get drunk or high. People who experience these symptoms can assess their use of drugs and alcohol by asking themselves the following questions:  Have you been told by friends or family that they are worried about your use of alcohol or drugs?  Do friends and family ever tell you about things you did while drinking alcohol or using drugs that you do not remember?  Do you lie about using alcohol or drugs or about the amounts you use?  Do you have difficulty completing daily tasks unless you use alcohol or drugs?  Is the level of your work or school performance lower because of your drug or alcohol use?  Do you get sick from using drugs or alcohol but keep using anyway?  Do you feel uncomfortable in social situations unless you use alcohol or drugs?  Do you use drugs or alcohol to help forget problems? An answer of yes to any of these questions may indicate chemical dependency. Professional evaluation is suggested. Document Released:  06/11/2001 Document Revised: 09/09/2011 Document Reviewed: 08/23/2010 [facility] Patient Information 2015 [place], [place]. This information is not intended to replace advice given to you by your health care provider. Make sure you discuss any questions you have with your health care provider.

## 2014-01-05 NOTE — [facility] Suicide Risk Assessment (Cosign Needed)
Suicide Risk Assessment  Discharge Assessment     Demographic Factors:  Male and black  Total Time spent with patient: 30 minutes Psychiatric Specialty Exam:      Blood pressure 158/90, pulse 64, temperature 97.7 F (36.5 C), temperature source Oral, resp. rate 20, SpO2 100.00%.There is no weight on file to calculate BMI.   General Appearance: Casual   Eye Contact:: Good   Speech: Clear and Coherent and Normal Rate   Volume: Normal   Mood: "I want some help; not 2 or 3 days"   Affect: Congruent   Thought Process: Circumstantial   Orientation: Full (Time, Place, and Person)   Thought Content: Rumination   Suicidal Thoughts: No   Homicidal Thoughts: No   Memory: Immediate; Good  Recent; Good  Remote; Good   Judgement: Fair   Insight: Lacking   Psychomotor Activity: Normal   Concentration: Fair   Recall: Good   Fund of Knowledge:Good   Language: Good   Akathisia: No   Handed: Right   AIMS (if indicated):   Assets: Communication Skills  Desire for Improvement   Sleep:   Musculoskeletal:  Strength & Muscle Tone: within normal limits  Gait & Station: normal  Patient leans: N/A    Mental Status Per Nursing Assessment::   On Admission:     Current Mental Status by Physician: Patient denies suicidal/homicidal ideation, psychosis, and paranoia  Loss Factors: NA  Historical Factors: NA  Risk Reduction Factors:   Patient seeking long term rehab assistance  Continued Clinical Symptoms:  Previous Psychiatric Diagnoses and Treatments  Cognitive Features That Contribute To Risk:  Closed-mindedness    Suicide Risk:  Minimal: No identifiable suicidal ideation.  Patients presenting with no risk factors but with morbid ruminations; may be classified as minimal risk based on the severity of the depressive symptoms  Discharge Diagnoses: AXIS I: Substance Abuse and Substance Induced Mood Disorder  AXIS II: Deferred  AXIS III:  Past Medical History   Diagnosis   Date   .  Diabetes mellitus without complication    .  Hypertension    .  Schizophrenia    .  CHF (congestive heart failure)     AXIS IV: other psychosocial or environmental problems  AXIS V: 61-70 mild symptoms     Plan Of Care/Follow-up recommendations:  Activity:  Resume usual activity Diet:  Resume usual diet  Is patient on multiple antipsychotic therapies at discharge:  No   Has Patient had three or more failed trials of antipsychotic monotherapy by history:  No  Recommended Plan for Multiple Antipsychotic Therapies: NA    Charlen Bakula, FNP-BC 01/05/2014, 11:18 AM

## 2014-01-05 NOTE — ED Provider Notes (Signed)
CSN: [id number]     Arrival date & time 01/05/14  0359 History   First MD Initiated Contact with Patient 01/05/14 0510     Chief Complaint  Patient presents with  . Suicidal   HPI  History provided by the patient. Patient is a 53 year old male with history of hypertension, diabetes, CHF, schizophrenia and previous SI who presents with suicidal ideations. Patient reports having recent suicidal ideations. Patient also has a history of drug and cocaine abuse. He admits to smoking cocaine earlier. [facility] this evening after presenting with requests for help with his cocaine addiction. Patient now reports feeling worsened depression and suicidal ideations she was not able to get any help. He does not have any specific plan. Denies any other aggravating or alleviating factors. No other associated symptoms.    Past Medical History  Diagnosis Date  . Diabetes mellitus without complication   . Hypertension   . Schizophrenia   . CHF (congestive heart failure)    No past surgical history on file. Family History  Problem Relation Age of Onset  . Hypertension Other   . Diabetes Other    History  Substance Use Topics  . Smoking status: Current Every Day Smoker -- 2.00 packs/day    Types: Cigarettes  . Smokeless tobacco: Current User  . Alcohol Use: Yes     Comment: wine     Review of Systems  All other systems reviewed and are negative.     Allergies  Haldol  Home Medications   Prior to Admission medications   Medication Sig Start Date End Date Taking? Authorizing Provider  acetaminophen (TYLENOL) 500 MG tablet Take 1 tablet (500 mg total) by mouth every 6 (six) hours as needed. 01/03/14  Yes Antony MaduraKelly Humes, PA-C  ARIPiprazole (ABILIFY) 5 MG tablet Take 1 tablet (5 mg total) by mouth 2 (two) times daily after a meal. 10/13/13  Yes Fransisca KaufmannLaura Davis, NP  cloNIDine (CATAPRES) 0.2 MG tablet Take 1 tablet (0.2 mg total) by mouth 2 (two)  times daily. 10/13/13  Yes Fransisca KaufmannLaura Davis, NP  gabapentin (NEURONTIN) 100 MG capsule Take 2 capsules (200 mg total) by mouth 2 (two) times daily. 10/13/13  Yes Fransisca KaufmannLaura Davis, NP  lisinopril (PRINIVIL,ZESTRIL) 20 MG tablet Take 1 tablet (20 mg total) by mouth daily. For high blood pressure. 10/13/13  Yes Fransisca KaufmannLaura Davis, NP  metFORMIN (GLUCOPHAGE) 500 MG tablet Take 1 tablet (500 mg total) by mouth 2 (two) times daily with a meal. 10/13/13  Yes Fransisca KaufmannLaura Davis, NP  traZODone (DESYREL) 100 MG tablet Take 1 tablet (100 mg total) by mouth at bedtime. 10/13/13  Yes Fransisca KaufmannLaura Davis, NP  trihexyphenidyl (ARTANE) 5 MG tablet Take 1 tablet (5 mg total) by mouth 2 (two) times daily with a meal. 10/13/13   Fransisca KaufmannLaura Davis, NP   BP 162/89  Pulse 82  Temp(Src) 98.5 F (36.9 C) (Oral)  Resp 18  SpO2 99% Physical Exam  Nursing note and vitals reviewed. Constitutional: He is oriented to person, place, and time. He appears well-developed and well-nourished.  HENT:  Head: Normocephalic.  Cardiovascular: Normal rate and regular rhythm.   Pulmonary/Chest: Effort normal and breath sounds normal. No respiratory distress. He has no wheezes.  Abdominal: Soft.  Musculoskeletal: Normal range of motion.  Neurological: He is alert and oriented to person, place, and time.  Psychiatric: He exhibits a depressed mood. He expresses suicidal ideation. He expresses no homicidal ideation.    ED Course  Procedures   COORDINATION OF CARE:  Nursing notes reviewed. Vital signs reviewed. Initial pt interview and examination performed.   Filed Vitals:   01/05/14 0402 01/05/14 0403  BP: 154/92 162/89  Pulse: 92 82  Temp:  98.5 F (36.9 C)  TempSrc:  Oral  Resp: 18 18  SpO2:  99%    5:25 AM-patient seen and evaluated. Patient well-appearing no acute distress. Expressing suicidal ideations. No plan.  Patient has been medically cleared. He is ready for further addiction and psychiatric evaluation.  Psychiatric holding orders and TTS consult  placed   Results for orders placed during the hospital encounter of 01/05/14  CBC WITH DIFFERENTIAL      Result Value Ref Range   WBC 5.4  4.0 - 10.5 K/uL   RBC 3.78 (*) 4.22 - 5.81 MIL/uL   Hemoglobin 11.0 (*) 13.0 - 17.0 g/dL   HCT 65.734.3 (*) 84.639.0 - 96.252.0 %   MCV 90.7  78.0 - 100.0 fL   MCH 29.1  26.0 - 34.0 pg   MCHC 32.1  30.0 - 36.0 g/dL   RDW 95.213.4  84.111.5 - 32.415.5 %   Platelets 177  150 - 400 K/uL   Neutrophils Relative % 64  43 - 77 %   Neutro Abs 3.5  1.7 - 7.7 K/uL   Lymphocytes Relative 26  12 - 46 %   Lymphs Abs 1.4  0.7 - 4.0 K/uL   Monocytes Relative 9  3 - 12 %   Monocytes Absolute 0.5  0.1 - 1.0 K/uL   Eosinophils Relative 1  0 - 5 %   Eosinophils Absolute 0.0  0.0 - 0.7 K/uL   Basophils Relative 0  0 - 1 %   Basophils Absolute 0.0  0.0 - 0.1 K/uL  COMPREHENSIVE METABOLIC PANEL      Result Value Ref Range   Sodium 143  137 - 147 mEq/L   Potassium 3.2 (*) 3.7 - 5.3 mEq/L   Chloride 104  96 - 112 mEq/L   CO2 28  19 - 32 mEq/L   Glucose, Bld 168 (*) 70 - 99 mg/dL   BUN 16  6 - 23 mg/dL   Creatinine, Ser 4.010.97  0.50 - 1.35 mg/dL   Calcium 02.710.0  8.4 - 25.310.5 mg/dL   Total Protein 7.4  6.0 - 8.3 g/dL   Albumin 4.1  3.5 - 5.2 g/dL   AST 16  0 - 37 U/L   ALT 6  0 - 53 U/L   Alkaline Phosphatase 78  39 - 117 U/L   Total Bilirubin 0.9  0.3 - 1.2 mg/dL   GFR calc non Af Amer >90  >90 mL/min   GFR calc Af Amer >90  >90 mL/min   Anion gap 11  5 - 15  ETHANOL      Result Value Ref Range   Alcohol, Ethyl (B) <11  0 - 11 mg/dL      MDM   Final diagnoses:  Suicidal ideations       Angus Sellereter S Jovana Rembold, PA-C 01/05/14 0601

## 2014-01-05 NOTE — ED Notes (Addendum)
Pt reports foot pain, due to neuropathy.  Pt reports that he can walk, but prefers a wheel chair.

## 2014-01-05 NOTE — ED Notes (Signed)
Pt states that he can't walk and complains about the neuropathy in his feet, [facility]

## 2014-01-05 NOTE — Consult Note (Signed)
[facility] Face-to-Face Psychiatry Consult   Reason for Consult:  Cocaine abuse Referring Physician:  EDP  [name] is an 53 y.o. male. Total Time spent with patient: 45 minutes  Assessment: AXIS I:  Substance Abuse and Substance Induced Mood Disorder AXIS II:  Deferred AXIS III:   Past Medical History  Diagnosis Date  . Diabetes mellitus without complication   . Hypertension   . Schizophrenia   . CHF (congestive heart failure)    AXIS IV:  other psychosocial or environmental problems AXIS V:  61-70 mild symptoms  Plan:  No evidence of imminent risk to self or others at present.   Patient does not meet criteria for psychiatric inpatient admission. Supportive therapy provided about ongoing stressors. Discussed crisis plan, support from social network, calling 911, coming to the Emergency Department, and calling Suicide Hotline.  Subjective:   [name] is a 53 y.o. male patient admitted with Cocaine Dependence.  HPI:  Patient states "I'm about to lose my feet cause of the neuropathy; and I been drugging. I just came in cause I want to get help."  Patient denies suicidal/homicidal ideation, psychosis, and paranoia.  Patient has not followed up with any outpatient resources since last discharge from hospital.  Patient was given the resource information while here in the hospital and encouraged to call the long term rehab facilities.  Patient state that he would call.  Patient states that he is not interested in CD IOP.   HPI Elements:   Location:  Cocaine Dependence. Quality:  episodic use of cocaine. Severity:  episodic use of cocaine. Timing:  1 week. Review of Systems  Constitutional: Negative for chills and diaphoresis.  Gastrointestinal: Negative for nausea, vomiting and abdominal pain.  Musculoskeletal: Negative for back pain, falls and neck pain.       Foot pain bilaterally.  Complaints of neuropathy    Neurological: Negative for headaches.   Psychiatric/Behavioral: Positive for substance abuse. Negative for depression, suicidal ideas and hallucinations. The patient is not nervous/anxious.    Family History  Problem Relation Age of Onset  . Hypertension Other   . Diabetes Other      Past Psychiatric History: Past Medical History  Diagnosis Date  . Diabetes mellitus without complication   . Hypertension   . Schizophrenia   . CHF (congestive heart failure)     reports that he has been smoking Cigarettes.  He has been smoking about 2.00 packs per day. He uses smokeless tobacco. He reports that he drinks alcohol. He reports that he uses illicit drugs ("Crack" cocaine and Cocaine) about 7 times per week. Family History  Problem Relation Age of Onset  . Hypertension Other   . Diabetes Other            Allergies:   Allergies  Allergen Reactions  . Haldol [Haloperidol] Other (See Comments)    Muscle spasms, loss of voluntary movement    ACT Assessment Complete:  Yes:    Educational Status    Risk to Self: Risk to self Substance abuse history and/or treatment for substance abuse?: Yes  Risk to Others:    Abuse:    Prior Inpatient Therapy:    Prior Outpatient Therapy:    Additional Information:        Objective: Blood pressure 158/90, pulse 64, temperature 97.7 F (36.5 C), temperature source Oral, resp. rate 20, SpO2 100.00%.There is no weight on file to calculate BMI. Results for orders placed during the hospital encounter of 01/05/14 (  from the past 72 hour(s))  CBC WITH DIFFERENTIAL     Status: Abnormal   Collection Time    01/05/14  4:37 AM      Result Value Ref Range   WBC 5.4  4.0 - 10.5 K/uL   RBC 3.78 (*) 4.22 - 5.81 MIL/uL   Hemoglobin 11.0 (*) 13.0 - 17.0 g/dL   HCT 34.3 (*) 39.0 - 52.0 %   MCV 90.7  78.0 - 100.0 fL   MCH 29.1  26.0 - 34.0 pg   MCHC 32.1  30.0 - 36.0 g/dL   RDW 13.4  11.5 - 15.5 %   Platelets 177  150 - 400 K/uL   Neutrophils Relative % 64  43 - 77 %   Neutro Abs 3.5  1.7  - 7.7 K/uL   Lymphocytes Relative 26  12 - 46 %   Lymphs Abs 1.4  0.7 - 4.0 K/uL   Monocytes Relative 9  3 - 12 %   Monocytes Absolute 0.5  0.1 - 1.0 K/uL   Eosinophils Relative 1  0 - 5 %   Eosinophils Absolute 0.0  0.0 - 0.7 K/uL   Basophils Relative 0  0 - 1 %   Basophils Absolute 0.0  0.0 - 0.1 K/uL  COMPREHENSIVE METABOLIC PANEL     Status: Abnormal   Collection Time    01/05/14  4:37 AM      Result Value Ref Range   Sodium 143  137 - 147 mEq/L   Potassium 3.2 (*) 3.7 - 5.3 mEq/L   Chloride 104  96 - 112 mEq/L   CO2 28  19 - 32 mEq/L   Glucose, Bld 168 (*) 70 - 99 mg/dL   BUN 16  6 - 23 mg/dL   Creatinine, Ser 0.97  0.50 - 1.35 mg/dL   Calcium 10.0  8.4 - 10.5 mg/dL   Total Protein 7.4  6.0 - 8.3 g/dL   Albumin 4.1  3.5 - 5.2 g/dL   AST 16  0 - 37 U/L   ALT 6  0 - 53 U/L   Alkaline Phosphatase 78  39 - 117 U/L   Total Bilirubin 0.9  0.3 - 1.2 mg/dL   GFR calc non Af Amer >90  >90 mL/min   GFR calc Af Amer >90  >90 mL/min   Comment: (NOTE)     The eGFR has been calculated using the CKD EPI equation.     This calculation has not been validated in all clinical situations.     eGFR's persistently <90 mL/min signify possible Chronic Kidney     Disease.   Anion gap 11  5 - 15  ETHANOL     Status: None   Collection Time    01/05/14  4:37 AM      Result Value Ref Range   Alcohol, Ethyl (B) <11  0 - 11 mg/dL   Comment:            LOWEST DETECTABLE LIMIT FOR     SERUM ALCOHOL IS 11 mg/dL     FOR MEDICAL PURPOSES ONLY  URINE RAPID DRUG SCREEN (HOSP PERFORMED)     Status: Abnormal   Collection Time    01/05/14  6:10 AM      Result Value Ref Range   Opiates NONE DETECTED  NONE DETECTED   Cocaine POSITIVE (*) NONE DETECTED   Benzodiazepines NONE DETECTED  NONE DETECTED   Amphetamines NONE DETECTED  NONE DETECTED   Tetrahydrocannabinol NONE  DETECTED  NONE DETECTED   Barbiturates NONE DETECTED  NONE DETECTED   Comment:            DRUG SCREEN FOR MEDICAL PURPOSES     ONLY.   IF CONFIRMATION IS NEEDED     FOR ANY PURPOSE, NOTIFY LAB     WITHIN 5 DAYS.                LOWEST DETECTABLE LIMITS     FOR URINE DRUG SCREEN     Drug Class       Cutoff (ng/mL)     Amphetamine      1000     Barbiturate      200     Benzodiazepine   568     Tricyclics       127     Opiates          300     Cocaine          300     THC              50   Labs are reviewed see above values.  Medications reviewed and no changes made  Current Facility-Administered Medications  Medication Dose Route Frequency Provider Last Rate Last Dose  . acetaminophen (TYLENOL) tablet 650 mg  650 mg Oral Q4H PRN Ruthell Rummage Dammen, PA-C   650 mg at 01/05/14 1003  . ARIPiprazole (ABILIFY) tablet 5 mg  5 mg Oral BID PC Wilmetta Speiser, NP      . cloNIDine (CATAPRES) tablet 0.2 mg  0.2 mg Oral BID Nickole Adamek, NP      . gabapentin (NEURONTIN) capsule 200 mg  200 mg Oral BID Honorio Devol, NP      . lisinopril (PRINIVIL,ZESTRIL) tablet 20 mg  20 mg Oral Daily Adiel Mcnamara, NP      . metFORMIN (GLUCOPHAGE) tablet 500 mg  500 mg Oral BID WC Nathanael Krist, NP      . nicotine (NICODERM CQ - dosed in mg/24 hours) patch 21 mg  21 mg Transdermal Daily Ruthell Rummage Dammen, PA-C   21 mg at 01/05/14 0958  . ondansetron (ZOFRAN) tablet 4 mg  4 mg Oral Q8H PRN Ruthell Rummage Dammen, PA-C      . potassium chloride SA (K-DUR,KLOR-CON) CR tablet 10 mEq  10 mEq Oral Daily Ruthell Rummage Dammen, PA-C   10 mEq at 01/05/14 1032  . traZODone (DESYREL) tablet 100 mg  100 mg Oral QHS Madhavi Hamblen, NP      . trihexyphenidyl (ARTANE) tablet 5 mg  5 mg Oral BID WC Mirza Kidney, NP      . zolpidem (AMBIEN) tablet 5 mg  5 mg Oral QHS PRN Martie Lee, PA-C       Current Outpatient Prescriptions  Medication Sig Dispense Refill  . acetaminophen (TYLENOL) 500 MG tablet Take 1 tablet (500 mg total) by mouth every 6 (six) hours as needed.  30 tablet  0  . ARIPiprazole (ABILIFY) 5 MG tablet Take 1 tablet (5 mg total) by mouth 2 (two) times daily after a  meal.  60 tablet  0  . cloNIDine (CATAPRES) 0.2 MG tablet Take 1 tablet (0.2 mg total) by mouth 2 (two) times daily.  60 tablet  0  . gabapentin (NEURONTIN) 100 MG capsule Take 2 capsules (200 mg total) by mouth 2 (two) times daily.  120 capsule  0  . lisinopril (PRINIVIL,ZESTRIL) 20 MG tablet Take 1 tablet (20 mg total)  by mouth daily. For high blood pressure.  30 tablet  0  . metFORMIN (GLUCOPHAGE) 500 MG tablet Take 1 tablet (500 mg total) by mouth 2 (two) times daily with a meal.  60 tablet  0  . traZODone (DESYREL) 100 MG tablet Take 1 tablet (100 mg total) by mouth at bedtime.  30 tablet  0  . trihexyphenidyl (ARTANE) 5 MG tablet Take 1 tablet (5 mg total) by mouth 2 (two) times daily with a meal.  60 tablet  0    Psychiatric Specialty Exam:     Blood pressure 158/90, pulse 64, temperature 97.7 F (36.5 C), temperature source Oral, resp. rate 20, SpO2 100.00%.There is no weight on file to calculate BMI.  General Appearance: Casual  Eye Contact::  Good  Speech:  Clear and Coherent and Normal Rate  Volume:  Normal  Mood:  "I want some help; not 2 or 3 days"  Affect:  Congruent  Thought Process:  Circumstantial  Orientation:  Full (Time, Place, and Person)  Thought Content:  Rumination  Suicidal Thoughts:  No  Homicidal Thoughts:  No  Memory:  Immediate;   Good Recent;   Good Remote;   Good  Judgement:  Fair  Insight:  Lacking  Psychomotor Activity:  Normal  Concentration:  Fair  Recall:  Good  Fund of Knowledge:Good  Language: Good  Akathisia:  No  Handed:  Right  AIMS (if indicated):     Assets:  Communication Skills Desire for Improvement  Sleep:      Musculoskeletal: Strength & Muscle Tone: within normal limits Gait & Station: normal Patient leans: N/A  Treatment Plan Summary: Discharge home with resource information for long term rehab facility information.    Aldine Grainger, FNP-BC 01/05/2014 10:58 AM

## 2014-01-05 NOTE — ED Notes (Signed)
Pt began to yell down the hall while laying in bed. This writer went into room and asked pt if he was okay and what was it he needed. Pt stated "I need the phone. The nurse told me that she would bring me the phone so I could call these places." This writer stated "We ask that pts come to the nurses station to use the phone." The pt then responded by stating

## 2014-01-05 NOTE — ED Provider Notes (Signed)
Medical screening examination/treatment/procedure(s) were performed by non-physician practitioner and as supervising physician I was immediately available for consultation/collaboration.   EKG Interpretation None       Derwood KaplanAnkit Loucille Takach, MD 01/05/14 0410

## 2014-01-05 NOTE — ED Notes (Signed)
[employer] called EMS to the bus stop where patient was, they said he was having trouble breathing which was false per EMS, pt was seen yesterday at [facility] for feet pain, pt told EMS tonight that he was having suicidal thoughts

## 2014-01-05 NOTE — ED Notes (Signed)
Pt states that he wants to talk to mental health, he feels like hurting himself and needs to get off crack. He also complains of the neuropathy in his feet

## 2014-01-05 NOTE — ED Provider Notes (Signed)
Medical screening examination/treatment/procedure(s) were performed by non-physician practitioner and as supervising physician I was immediately available for consultation/collaboration.   EKG Interpretation None       Jhonnie Aliano, MD 01/05/14 0410 

## 2014-01-06 NOTE — Consult Note (Signed)
Face to face evaluation and I agree with this note 

## 2014-01-08 NOTE — ED Provider Notes (Signed)
Medical screening examination/treatment/procedure(s) were performed by non-physician practitioner and as supervising physician I was immediately available for consultation/collaboration.   EKG Interpretation None        Rolland PorterMark Wakisha Alberts, MD 01/08/14 0700

## 2014-03-07 ENCOUNTER — Emergency Department (HOSPITAL_COMMUNITY)
Admission: EM | Admit: 2014-03-07 | Discharge: 2014-03-07 | Disposition: A | Discharge status: Home / Self Care | Payer: Medicare Other | Attending: Emergency Medicine | Admitting: Emergency Medicine

## 2014-03-07 ENCOUNTER — Encounter (HOSPITAL_COMMUNITY): Payer: Self-pay | Admitting: Emergency Medicine

## 2014-03-07 DIAGNOSIS — F1420 Cocaine dependence, uncomplicated: Secondary | ICD-10-CM | POA: Diagnosis present

## 2014-03-07 DIAGNOSIS — R45850 Homicidal ideations: Secondary | ICD-10-CM

## 2014-03-07 DIAGNOSIS — F200 Paranoid schizophrenia: Secondary | ICD-10-CM | POA: Diagnosis present

## 2014-03-07 DIAGNOSIS — R45851 Suicidal ideations: Secondary | ICD-10-CM | POA: Insufficient documentation

## 2014-03-07 DIAGNOSIS — I509 Heart failure, unspecified: Secondary | ICD-10-CM | POA: Insufficient documentation

## 2014-03-07 DIAGNOSIS — F17200 Nicotine dependence, unspecified, uncomplicated: Secondary | ICD-10-CM | POA: Insufficient documentation

## 2014-03-07 DIAGNOSIS — I10 Essential (primary) hypertension: Secondary | ICD-10-CM | POA: Insufficient documentation

## 2014-03-07 DIAGNOSIS — F209 Schizophrenia, unspecified: Secondary | ICD-10-CM

## 2014-03-07 DIAGNOSIS — R443 Hallucinations, unspecified: Secondary | ICD-10-CM | POA: Insufficient documentation

## 2014-03-07 DIAGNOSIS — Z79899 Other long term (current) drug therapy: Secondary | ICD-10-CM | POA: Insufficient documentation

## 2014-03-07 DIAGNOSIS — E119 Type 2 diabetes mellitus without complications: Secondary | ICD-10-CM | POA: Insufficient documentation

## 2014-03-07 LAB — CBC WITH DIFFERENTIAL/PLATELET
Basophils Absolute: 0 10*3/uL (ref 0.0–0.1)
Basophils Relative: 0 % (ref 0–1)
EOS ABS: 0 10*3/uL (ref 0.0–0.7)
Eosinophils Relative: 0 % (ref 0–5)
HCT: 34.8 % — ABNORMAL LOW (ref 39.0–52.0)
Hemoglobin: 11.5 g/dL — ABNORMAL LOW (ref 13.0–17.0)
Lymphocytes Relative: 18 % (ref 12–46)
Lymphs Abs: 1.5 10*3/uL (ref 0.7–4.0)
MCH: 28.8 pg (ref 26.0–34.0)
MCHC: 33 g/dL (ref 30.0–36.0)
MCV: 87.2 fL (ref 78.0–100.0)
Monocytes Absolute: 0.8 10*3/uL (ref 0.1–1.0)
Monocytes Relative: 10 % (ref 3–12)
NEUTROS PCT: 72 % (ref 43–77)
Neutro Abs: 5.7 10*3/uL (ref 1.7–7.7)
PLATELETS: 198 10*3/uL (ref 150–400)
RBC: 3.99 MIL/uL — ABNORMAL LOW (ref 4.22–5.81)
RDW: 12.5 % (ref 11.5–15.5)
WBC: 8 10*3/uL (ref 4.0–10.5)

## 2014-03-07 LAB — COMPREHENSIVE METABOLIC PANEL
ALBUMIN: 4.8 g/dL (ref 3.5–5.2)
ALK PHOS: 83 U/L (ref 39–117)
ALT: 7 U/L (ref 0–53)
ANION GAP: 13 (ref 5–15)
AST: 16 U/L (ref 0–37)
BUN: 21 mg/dL (ref 6–23)
CO2: 29 mEq/L (ref 19–32)
Calcium: 10.6 mg/dL — ABNORMAL HIGH (ref 8.4–10.5)
Chloride: 104 mEq/L (ref 96–112)
Creatinine, Ser: 0.94 mg/dL (ref 0.50–1.35)
GFR calc Af Amer: >90 mL/min (ref >90)
GFR calc non Af Amer: >90 mL/min (ref >90)
Glucose, Bld: 117 mg/dL — ABNORMAL HIGH (ref 70–99)
POTASSIUM: 3.4 meq/L — AB (ref 3.7–5.3)
SODIUM: 146 meq/L (ref 137–147)
Total Bilirubin: 0.7 mg/dL (ref 0.3–1.2)
Total Protein: 8.4 g/dL — ABNORMAL HIGH (ref 6.0–8.3)

## 2014-03-07 LAB — ETHANOL: Alcohol, Ethyl (B): <11 mg/dL (ref 0–11)

## 2014-03-07 MED ORDER — METFORMIN HCL 500 MG PO TABS: 500.0000 mg | ORAL_TABLET | Freq: Two times a day (BID) | ORAL | Status: DC

## 2014-03-07 MED ORDER — TRAZODONE HCL 100 MG PO TABS: 100.0000 mg | ORAL_TABLET | Freq: Every day | ORAL | Status: DC

## 2014-03-07 MED ORDER — NICOTINE 21 MG/24HR TD PT24: 21.0000 mg | MEDICATED_PATCH | Freq: Every day | TRANSDERMAL | Status: DC

## 2014-03-07 MED ORDER — LISINOPRIL 20 MG PO TABS: 20.0000 mg | ORAL_TABLET | Freq: Every day | ORAL | Status: DC

## 2014-03-07 MED ORDER — LORAZEPAM 1 MG PO TABS
1.0000 mg | ORAL_TABLET | Freq: Three times a day (TID) | ORAL | Status: DC | PRN
  Administered 2014-03-07: 1 mg via ORAL
  Filled 2014-03-07: qty 1

## 2014-03-07 MED ORDER — CLONIDINE HCL 0.2 MG PO TABS: 0.2000 mg | ORAL_TABLET | Freq: Two times a day (BID) | ORAL | Status: DC

## 2014-03-07 MED ORDER — ZIPRASIDONE MESYLATE 20 MG IM SOLR
10.0000 mg | Freq: Once | INTRAMUSCULAR | Status: AC
  Administered 2014-03-07: 10 mg via INTRAMUSCULAR
  Filled 2014-03-07: qty 20

## 2014-03-07 MED ORDER — GABAPENTIN 100 MG PO CAPS: 200.0000 mg | ORAL_CAPSULE | Freq: Two times a day (BID) | ORAL | Status: DC

## 2014-03-07 MED ORDER — ALUM & MAG HYDROXIDE-SIMETH 200-200-20 MG/5ML PO SUSP: 30.0000 mL | ORAL | Status: DC | PRN

## 2014-03-07 MED ORDER — STERILE WATER FOR INJECTION IJ SOLN
INTRAMUSCULAR | Status: AC
  Administered 2014-03-07: 1.2 mL
  Filled 2014-03-07: qty 10

## 2014-03-07 MED ORDER — ACETAMINOPHEN 325 MG PO TABS: 650.0000 mg | ORAL_TABLET | ORAL | Status: DC | PRN

## 2014-03-07 MED ORDER — ONDANSETRON HCL 4 MG PO TABS: 4.0000 mg | ORAL_TABLET | Freq: Three times a day (TID) | ORAL | Status: DC | PRN

## 2014-03-07 MED ORDER — IBUPROFEN 200 MG PO TABS: 600.0000 mg | ORAL_TABLET | Freq: Three times a day (TID) | ORAL | Status: DC | PRN

## 2014-03-07 MED ORDER — ARIPIPRAZOLE 5 MG PO TABS: 5.0000 mg | ORAL_TABLET | Freq: Two times a day (BID) | ORAL | Status: DC

## 2014-03-07 MED ORDER — ZOLPIDEM TARTRATE 5 MG PO TABS: 5.0000 mg | ORAL_TABLET | Freq: Every evening | ORAL | Status: DC | PRN

## 2014-03-07 MED ORDER — TRIHEXYPHENIDYL HCL 5 MG PO TABS: 5.0000 mg | ORAL_TABLET | Freq: Two times a day (BID) | ORAL | Status: DC

## 2014-03-07 NOTE — BH Assessment (Signed)
Tele Assessment Note   [name] is an 53 y.o. male presenting to [facility] ED via EMS from jail reporting SI. Pt stated "I have a problem with depression and I need some help".  Pt also shared that he was recently released from jail after serving 50 days for trespassing and having drug paraphernalia.  Pt is currently endorsing SI with a plan to make his enemies fight him and kill him. Pt denies any previous suicide attempt s but shared that he has been hospitalized on multiple occasions. Pt reported that he is receiving mental health treatment through [facility]; however he has not been in several months. Pt did not endorse many depressive symptoms and shared that he has been sleeping and eating well. Pt is also endorsing HI. Pt stated "I have nations to kill and I will attack them with devices". Pt also stated "there are certain men trying to kill me, I hope it's not my own race whatever my race is". Pt denied having access to weapons but shared "I will buy a gun". Pt is endorsing AVH. Pt stated "I am a gifted child like [name], I can hear from here to around the world". Pt reported that he has been abusing crack/cocaine for many years not and he most recently used today. PT did not report any illicit substance use.  Pt is alert and oriented x3. Pt is calm and cooperative at this time. Pt maintained fair eye contact throughout this assessment. Pt's speech was pressured and soft at times. Pt thought process was tangential and his judgement is fair. Pt did not report any physical or sexual abuse but shared that people will emotionally abuse you.  Pt reported that he is homeless and when asked about family support pt stated "I am an orphan,  all my family is dead".  Axis I: Paranoid Schizophrenia  Axis II: Deferred Axis III:  Past Medical History  Diagnosis Date  . Diabetes mellitus without complication   . Hypertension   . Schizophrenia   . CHF (congestive heart failure)    Axis IV: economic  problems, housing problems and problems with primary support group Axis V: 21-30 behavior considerably influenced by delusions or hallucinations OR serious impairment in judgment, communication OR inability to function in almost all areas  Past Medical History:  Past Medical History  Diagnosis Date  . Diabetes mellitus without complication   . Hypertension   . Schizophrenia   . CHF (congestive heart failure)     History reviewed. No pertinent past surgical history.  Family History:  Family History  Problem Relation Age of Onset  . Hypertension Other   . Diabetes Other     Social History:  reports that he has been smoking Cigarettes.  He has been smoking about 2.00 packs per day. He uses smokeless tobacco. He reports that he drinks alcohol. He reports that he uses illicit drugs ("Crack" cocaine and Cocaine) about 7 times per week.  Additional Social History:  Alcohol / Drug Use History of alcohol / drug use?: Yes Substance #1 Name of Substance 1: Crack/Cocaine  1 - Age of First Use: "20's" 1 - Amount (size/oz): "enough to get me high" 1 - Frequency: "whenever I can" 1 - Duration: ongoing  1 - Last Use / Amount: 03-06-14  CIWA: CIWA-Ar BP: 160/91 mmHg Pulse Rate: 104 COWS:    PATIENT STRENGTHS: (choose at least two) Average or above average intelligence Motivation for treatment/growth  Allergies:  Allergies  Allergen Reactions  .  Haldol [Haloperidol] Other (See Comments)    Muscle spasms, loss of voluntary movement    Home Medications:  (Not in a hospital admission)  OB/GYN Status:  No LMP for male patient.  General Assessment Data Location of Assessment: [facility] ED Is this a Tele or Face-to-Face Assessment?: Face-to-Face Is this an Initial Assessment or a Re-assessment for this encounter?: Initial Assessment Living Arrangements: Other (Comment) (Homeless) Can pt return to current living arrangement?: Yes Admission Status: Voluntary Is patient capable of signing  voluntary admission?: Yes Transfer from: Home Referral Source: Self/Family/Friend     [facility] Crisis Care Plan Living Arrangements: Other (Comment) (Homeless) Name of Psychiatrist: Transport planner Name of Therapist: NA  Education Status Is patient currently in school?: No Current Grade: NA Highest grade of school patient has completed: 12 Name of school: NA Contact person: NA  Risk to self with the past 6 months Suicidal Ideation: Yes-Currently Present Suicidal Intent: Yes-Currently Present Is patient at risk for suicide?: Yes Suicidal Plan?: Yes-Currently Present Specify Current Suicidal Plan: "Fight my enemies and let them kill me". Access to Means: Yes Specify Access to Suicidal Means: Pt reported that he has enemies. What has been your use of drugs/alcohol within the last 12 months?: Drug use since released from jail. Previous Attempts/Gestures: No (Pt did not report any previous suicide attempts) How many times?: 0 Other Self Harm Risks: No other self harm risk identified at this time.  Triggers for Past Attempts: None known Intentional Self Injurious Behavior: None Family Suicide History: No Recent stressful life event(s): Other (Comment) (Homeless) Persecutory voices/beliefs?: No Depression: No Depression Symptoms: Tearfulness;Guilt;Feeling angry/irritable Substance abuse history and/or treatment for substance abuse?: Yes Suicide prevention information given to non-admitted patients: Not applicable  Risk to Others within the past 6 months Homicidal Ideation: Yes-Currently Present Thoughts of Harm to Others: Yes-Currently Present Comment - Thoughts of Harm to Others: "I have nations to kill".  Current Homicidal Intent: Yes-Currently Present Current Homicidal Plan: Yes-Currently Present Describe Current Homicidal Plan: "I will attack them with devices".  Access to Homicidal Means: Yes Describe Access to Homicidal Means: "I would buy a gun". Identified Victim: NA History of  harm to others?: No Assessment of Violence: None Noted Violent Behavior Description: No violent behaviors observed. Pt is calm and cooperative at this time.  Does patient have access to weapons?: No (Pt stated "I will buy a gun". ) Criminal Charges Pending?: No Does patient have a court date: No  Psychosis Hallucinations: Auditory;Visual ("I am like Daniel". "I can hear from around the world".) Delusions: Persecutory;Grandiose  Mental Status Report Appear/Hygiene: Disheveled;In scrubs Eye Contact: Fair Motor Activity: Freedom of movement Speech: Slow;Pressured Level of Consciousness: Quiet/awake Mood: Euthymic Affect: Appropriate to circumstance Anxiety Level: Minimal Thought Processes: Tangential Judgement: Impaired Orientation: Person;Place;Time;Situation Obsessive Compulsive Thoughts/Behaviors: Minimal  Cognitive Functioning Concentration: Fair Memory: Recent Intact;Remote Intact IQ: Average Insight: Fair Impulse Control: Fair Appetite: Good Weight Loss: 30 Weight Gain: 0 Sleep: No Change Total Hours of Sleep: 8 Vegetative Symptoms: None  ADLScreening [facility] Assessment Services) Patient's cognitive ability adequate to safely complete daily activities?: Yes Patient able to express need for assistance with ADLs?: Yes Independently performs ADLs?: Yes (appropriate for developmental age)  Prior Inpatient Therapy Prior Inpatient Therapy: Yes Prior Therapy Dates: "1986-present" Prior Therapy Facilty/Provider(s): [facility]; [facility]; [facility] Reason for Treatment: Schizophrenia  Prior Outpatient Therapy Prior Outpatient Therapy: Yes Prior Therapy Dates: 2015 Prior Therapy Facilty/Provider(s): [name] Reason for Treatment: Schizophrenia   ADL Screening (condition at time of admission)  Patient's cognitive ability adequate to safely complete daily activities?: Yes Is the patient deaf or have difficulty hearing?: No Does the patient have difficulty seeing, even  when wearing glasses/contacts?: No Does the patient have difficulty concentrating, remembering, or making decisions?: No Patient able to express need for assistance with ADLs?: Yes Does the patient have difficulty dressing or bathing?: No Independently performs ADLs?: Yes (appropriate for developmental age)       Abuse/Neglect Assessment (Assessment to be complete while patient is alone) Physical Abuse: Denies Verbal Abuse: Denies Sexual Abuse: Denies Exploitation of patient/patient's resources: Denies Self-Neglect: Denies Values / Beliefs Cultural Requests During Hospitalization: None Spiritual Requests During Hospitalization: None   Advance Directives (For Healthcare) Does patient have an advance directive?: No Would patient like information on creating an advanced directive?: No - patient declined information    Additional Information 1:1 In Past 12 Months?: No CIRT Risk: No Elopement Risk: No     Disposition:  Disposition Initial Assessment Completed for this Encounter: Yes Disposition of Patient: Other dispositions Other disposition(s): Other (Comment) (Psychiatric evaluation in the morning.)  Etan Vasudevan S 03/07/2014 5:16 AM

## 2014-03-07 NOTE — [facility] Suicide Risk Assessment (Signed)
Suicide Risk Assessment  Discharge Assessment     Demographic Factors:  Male  Total Time spent with patient: 20 minutes  Psychiatric Specialty Exam:     Blood pressure 112/80, pulse 90, temperature 97.8 F (36.6 C), temperature source Oral, resp. rate 20, SpO2 100.00%.There is no weight on file to calculate BMI.  General Appearance: Casual  Eye Contact::  Good  Speech:  Normal Rate  Volume:  Normal  Mood:  Euthymic  Affect:  Congruent  Thought Process:  Coherent  Orientation:  Full (Time, Place, and Person)  Thought Content:  WDL  Suicidal Thoughts:  No  Homicidal Thoughts:  No  Memory:  Immediate;   Good Recent;   Good Remote;   Good  Judgement:  Fair  Insight:  Good  Psychomotor Activity:  Normal  Concentration:  Good  Recall:  Good  Fund of Knowledge:Fair  Language: Good  Akathisia:  No  Handed:  Right  AIMS (if indicated):     Assets:  Communication Skills Desire for Improvement Financial Resources/Insurance Leisure Time Physical Health Resilience  Sleep:      Musculoskeletal: Strength & Muscle Tone: within normal limits Gait & Station: normal Patient leans: N/A  Mental Status Per Nursing Assessment::   On Admission:   Suicidal ideations  Current Mental Status by Physician: NA  Loss Factors: NA  Historical Factors: NA  Risk Reduction Factors:   Positive therapeutic relationship  Continued Clinical Symptoms:  None  Cognitive Features That Contribute To Risk:  None  Suicide Risk:  Minimal: No identifiable suicidal ideation.  Patients presenting with no risk factors but with morbid ruminations; may be classified as minimal risk based on the severity of the depressive symptoms  Discharge Diagnoses:   AXIS I:  Chronic Paranoid Schizophrenia AXIS II:  Deferred AXIS III:   Past Medical History  Diagnosis Date  . Diabetes mellitus without complication   . Hypertension   . Schizophrenia   . CHF (congestive heart failure)    AXIS IV:   other psychosocial or environmental problems, problems related to legal system/crime and problems related to social environment AXIS V:  61-70 mild symptoms  Plan Of Care/Follow-up recommendations:  Activity:  as tolerated Diet:  low-sodium heart healthy diet  Is patient on multiple antipsychotic therapies at discharge:  No   Has Patient had three or more failed trials of antipsychotic monotherapy by history:  No  Recommended Plan for Multiple Antipsychotic Therapies: NA    LORD, JAMISON, PMH-NP 03/07/2014, 1:07 PM

## 2014-03-07 NOTE — ED Notes (Addendum)
Pt escorted to discharge window. Verbalized understanding discharge instructions. In no acute distress.  Vitals reviewed and WDL.  Pt given a bus pass.

## 2014-03-07 NOTE — ED Notes (Signed)
Pt has one belonging bag in locker #27  

## 2014-03-07 NOTE — Discharge Instructions (Signed)
Schizophrenia °Schizophrenia is a mental illness. It may cause disturbed or disorganized thinking, speech, or behavior. People with schizophrenia have problems functioning in one or more areas of life: work, school, home, or relationships. People with schizophrenia are at increased risk for suicide, certain chronic physical illnesses, and unhealthy behaviors, such as smoking and drug use. °People who have family members with schizophrenia are at higher risk of developing the illness. Schizophrenia affects men and women equally but usually appears at an earlier age (teenage or early adult years) in men.  °SYMPTOMS °The earliest symptoms are often subtle (prodrome) and may go unnoticed until the illness becomes more severe (first-break psychosis). Symptoms of schizophrenia may be continuous or may come and go in severity. Episodes often are triggered by major life events, such as family stress, college, military service, marriage, pregnancy or child birth, divorce, or loss of a loved one. People with schizophrenia may see, hear, or feel things that do not exist (hallucinations). They may have false beliefs in spite of obvious proof to the contrary (delusions). Sometimes speech is incoherent or behavior is odd or withdrawn.  °DIAGNOSIS °Schizophrenia is diagnosed through an assessment by your caregiver. Your caregiver will ask questions about your thoughts, behavior, mood, and ability to function in daily life. Your caregiver may ask questions about your medical history and use of alcohol or drugs, including prescription medication. Your caregiver may also order blood tests and imaging exams. Certain medical conditions and substances can cause symptoms that resemble schizophrenia. Your caregiver may refer you to a mental health specialist for evaluation. There are three major criterion for a diagnosis of schizophrenia: °· Two or more of the following five symptoms are present for a month or longer: °¨ Delusions. Often  the delusions are that you are being attacked, harassed, cheated, persecuted or conspired against (persecutory delusions). °¨ Hallucinations.   °¨ Disorganized speech that does not make sense to others. °¨ Grossly disorganized (confused or unfocused) behavior or extremely overactive or underactive motor activity (catatonia). °¨ Negative symptoms such as bland or blunted emotions (flat affect), loss of will power (avolition), and withdrawal from social contacts (social isolation). °· Level of functioning in one or more major areas of life (work, school, relationships, or self-care) is markedly below the level of functioning before the onset of illness.   °· There are continuous signs of illness (either mild symptoms or decreased level of functioning) for at least 6 months or longer. °TREATMENT  °Schizophrenia is a long-term illness. It is best controlled with continuous treatment rather than treatment only when symptoms occur. The following treatments are used to manage schizophrenia: °· Medication--Medication is the most effective and important form of treatment for schizophrenia. Antipsychotic medications are usually prescribed to help manage schizophrenia. Other types of medication may be added to relieve any symptoms that may occur despite the use of antipsychotic medications. °· Counseling or talk therapy--Individual, group, or family counseling may be helpful in providing education, support, and guidance. Many people with schizophrenia also benefit from social skills and job skills (vocational) training. °A combination of medication and counseling is best for managing the disorder over time. A procedure in which electricity is applied to the brain through the scalp (electroconvulsive therapy) may be used to treat catatonic schizophrenia or schizophrenia in people who cannot take or do not respond to medication and counseling. °Document Released: 06/14/2000 Document Revised: 02/17/2013 Document Reviewed:  09/09/2012 °[employer]® Patient Information ©2015 [employer]. This information is not intended to replace advice given to you by   your health care provider. Make sure you discuss any questions you have with your health care provider. ° °

## 2014-03-07 NOTE — ED Notes (Signed)
CBG 132 and BP 160/100 per EMS

## 2014-03-07 NOTE — Consult Note (Signed)
[facility] Face-to-Face Psychiatry Consult   Reason for Consult:  Suicidal ideations Referring Physician:  EDP  KERRICK MILER is an 53 y.o. male. Total Time spent with patient: 20 minutes  Assessment: AXIS I:  Chronic Paranoid Schizophrenia AXIS II:  Deferred AXIS III:   Past Medical History  Diagnosis Date  . Diabetes mellitus without complication   . Hypertension   . Schizophrenia   . CHF (congestive heart failure)    AXIS IV:  other psychosocial or environmental problems, problems related to legal system/crime and problems related to social environment AXIS V:  61-70 mild symptoms  Plan:  No evidence of imminent risk to self or others at present.  Dr. assessed the patient and concurs with the plan.  Subjective:   TRACEY [name] is a 53 y.o. male patient does not warrant admission.  HPI:  Patient denied suicidal/homicidal ideations, hallucinations, and alcohol/drug abuse.  He states he just wants his medications, psychiatric medications, and then he will go back to jail.  [name] wants to leave. HPI Elements:   Location:  generalized. Quality:  acute. Severity:  mild. Timing:  brief. Duration:  brief. Context:  stressors.  Past Psychiatric History: Past Medical History  Diagnosis Date  . Diabetes mellitus without complication   . Hypertension   . Schizophrenia   . CHF (congestive heart failure)     reports that he has been smoking Cigarettes.  He has been smoking about 2.00 packs per day. He uses smokeless tobacco. He reports that he drinks alcohol. He reports that he uses illicit drugs ("Crack" cocaine and Cocaine) about 7 times per week. Family History  Problem Relation Age of Onset  . Hypertension Other   . Diabetes Other    Family History Substance Abuse: No Family Supports: No Living Arrangements: Other (Comment) (Homeless) Can pt return to current living arrangement?: Yes Abuse/Neglect [facility]) Physical Abuse: Denies Verbal Abuse:  Denies Sexual Abuse: Denies Allergies:   Allergies  Allergen Reactions  . Haldol [Haloperidol] Other (See Comments)    Muscle spasms, loss of voluntary movement    ACT Assessment Complete:  Yes:    Educational Status    Risk to Self: Risk to self with the past 6 months Suicidal Ideation: Yes-Currently Present Suicidal Intent: Yes-Currently Present Is patient at risk for suicide?: Yes Suicidal Plan?: Yes-Currently Present Specify Current Suicidal Plan: "Fight my enemies and let them kill me". Access to Means: Yes Specify Access to Suicidal Means: Pt reported that he has enemies. What has been your use of drugs/alcohol within the last 12 months?: Drug use since released from jail. Previous Attempts/Gestures: No (Pt did not report any previous suicide attempts) How many times?: 0 Other Self Harm Risks: No other self harm risk identified at this time.  Triggers for Past Attempts: None known Intentional Self Injurious Behavior: None Family Suicide History: No Recent stressful life event(s): Other (Comment) (Homeless) Persecutory voices/beliefs?: No Depression: No Depression Symptoms: Tearfulness;Guilt;Feeling angry/irritable Substance abuse history and/or treatment for substance abuse?: Yes Suicide prevention information given to non-admitted patients: Not applicable  Risk to Others: Risk to Others within the past 6 months Homicidal Ideation: Yes-Currently Present Thoughts of Harm to Others: Yes-Currently Present Comment - Thoughts of Harm to Others: "I have nations to kill".  Current Homicidal Intent: Yes-Currently Present Current Homicidal Plan: Yes-Currently Present Describe Current Homicidal Plan: "I will attack them with devices".  Access to Homicidal Means: Yes Describe Access to Homicidal Means: "I would buy a gun". Identified Victim: NA History of  harm to others?: No Assessment of Violence: None Noted Violent Behavior Description: No violent behaviors observed. Pt is  calm and cooperative at this time.  Does patient have access to weapons?: No (Pt stated "I will buy a gun". ) Criminal Charges Pending?: No Does patient have a court date: No  Abuse: Abuse/Neglect Assessment (Assessment to be complete while patient is alone) Physical Abuse: Denies Verbal Abuse: Denies Sexual Abuse: Denies Exploitation of patient/patient's resources: Denies Self-Neglect: Denies  Prior Inpatient Therapy: Prior Inpatient Therapy Prior Inpatient Therapy: Yes Prior Therapy Dates: "1986-present" Prior Therapy Facilty/Provider(s): [facility]ollie [facility]; [facility]; [place] Reason for Treatment: Schizophrenia  Prior Outpatient Therapy: Prior Outpatient Therapy Prior Outpatient Therapy: Yes Prior Therapy Dates: 2015 Prior Therapy Facilty/Provider(s): [name] Reason for Treatment: Schizophrenia   Additional Information: Additional Information 1:1 In Past 12 Months?: No CIRT Risk: No Elopement Risk: No                  Objective: Blood pressure 112/80, pulse 90, temperature 97.8 F (36.6 C), temperature source Oral, resp. rate 20, SpO2 100.00%.There is no weight on file to calculate BMI. Results for orders placed during the hospital encounter of 03/07/14 (from the past 72 hour(s))  CBC WITH DIFFERENTIAL     Status: Abnormal   Collection Time    03/07/14  3:20 AM      Result Value Ref Range   WBC 8.0  4.0 - 10.5 K/uL   RBC 3.99 (*) 4.22 - 5.81 MIL/uL   Hemoglobin 11.5 (*) 13.0 - 17.0 g/dL   HCT 34.8 (*) 39.0 - 52.0 %   MCV 87.2  78.0 - 100.0 fL   MCH 28.8  26.0 - 34.0 pg   MCHC 33.0  30.0 - 36.0 g/dL   RDW 12.5  11.5 - 15.5 %   Platelets 198  150 - 400 K/uL   Neutrophils Relative % 72  43 - 77 %   Neutro Abs 5.7  1.7 - 7.7 K/uL   Lymphocytes Relative 18  12 - 46 %   Lymphs Abs 1.5  0.7 - 4.0 K/uL   Monocytes Relative 10  3 - 12 %   Monocytes Absolute 0.8  0.1 - 1.0 K/uL   Eosinophils Relative 0  0 - 5 %   Eosinophils Absolute 0.0  0.0 - 0.7 K/uL    Basophils Relative 0  0 - 1 %   Basophils Absolute 0.0  0.0 - 0.1 K/uL  COMPREHENSIVE METABOLIC PANEL     Status: Abnormal   Collection Time    03/07/14  3:20 AM      Result Value Ref Range   Sodium 146  137 - 147 mEq/L   Potassium 3.4 (*) 3.7 - 5.3 mEq/L   Chloride 104  96 - 112 mEq/L   CO2 29  19 - 32 mEq/L   Glucose, Bld 117 (*) 70 - 99 mg/dL   BUN 21  6 - 23 mg/dL   Creatinine, Ser 0.94  0.50 - 1.35 mg/dL   Calcium 10.6 (*) 8.4 - 10.5 mg/dL   Total Protein 8.4 (*) 6.0 - 8.3 g/dL   Albumin 4.8  3.5 - 5.2 g/dL   AST 16  0 - 37 U/L   ALT 7  0 - 53 U/L   Alkaline Phosphatase 83  39 - 117 U/L   Total Bilirubin 0.7  0.3 - 1.2 mg/dL   GFR calc non Af Amer >90  >90 mL/min   GFR calc Af Amer >90  >  90 mL/min   Comment: (NOTE)     The eGFR has been calculated using the CKD EPI equation.     This calculation has not been validated in all clinical situations.     eGFR's persistently <90 mL/min signify possible Chronic Kidney     Disease.   Anion gap 13  5 - 15  ETHANOL     Status: None   Collection Time    03/07/14  3:20 AM      Result Value Ref Range   Alcohol, Ethyl (B) <11  0 - 11 mg/dL   Comment:            LOWEST DETECTABLE LIMIT FOR     SERUM ALCOHOL IS 11 mg/dL     FOR MEDICAL PURPOSES ONLY   Labs are reviewed and are pertinent for no medical issues.  Current Facility-Administered Medications  Medication Dose Route Frequency Provider Last Rate Last Dose  . acetaminophen (TYLENOL) tablet 650 mg  650 mg Oral Q4H PRN Threasa Beards, MD      . alum & mag hydroxide-simeth (MAALOX/MYLANTA) 200-200-20 MG/5ML suspension 30 mL  30 mL Oral PRN Threasa Beards, MD      . ibuprofen (ADVIL,MOTRIN) tablet 600 mg  600 mg Oral Q8H PRN Threasa Beards, MD      . LORazepam (ATIVAN) tablet 1 mg  1 mg Oral Q8H PRN Threasa Beards, MD   1 mg at 03/07/14 0544  . nicotine (NICODERM CQ - dosed in mg/24 hours) patch 21 mg  21 mg Transdermal Daily Threasa Beards, MD      . ondansetron [facility])  tablet 4 mg  4 mg Oral Q8H PRN Threasa Beards, MD      . zolpidem (AMBIEN) tablet 5 mg  5 mg Oral QHS PRN Threasa Beards, MD       Current Outpatient Prescriptions  Medication Sig Dispense Refill  . acetaminophen (TYLENOL) 500 MG tablet Take 500 mg by mouth every 6 (six) hours as needed for mild pain.      . ARIPiprazole (ABILIFY) 5 MG tablet Take 1 tablet (5 mg total) by mouth 2 (two) times daily after a meal.  60 tablet  0  . cloNIDine (CATAPRES) 0.2 MG tablet Take 1 tablet (0.2 mg total) by mouth 2 (two) times daily.  60 tablet  0  . gabapentin (NEURONTIN) 100 MG capsule Take 2 capsules (200 mg total) by mouth 2 (two) times daily.  120 capsule  0  . lisinopril (PRINIVIL,ZESTRIL) 20 MG tablet Take 1 tablet (20 mg total) by mouth daily. For high blood pressure.  30 tablet  0  . metFORMIN (GLUCOPHAGE) 500 MG tablet Take 1 tablet (500 mg total) by mouth 2 (two) times daily with a meal.  60 tablet  0  . traZODone (DESYREL) 100 MG tablet Take 1 tablet (100 mg total) by mouth at bedtime.  30 tablet  0  . trihexyphenidyl (ARTANE) 5 MG tablet Take 1 tablet (5 mg total) by mouth 2 (two) times daily with a meal.  60 tablet  0    Psychiatric Specialty Exam:     Blood pressure 112/80, pulse 90, temperature 97.8 F (36.6 C), temperature source Oral, resp. rate 20, SpO2 100.00%.There is no weight on file to calculate BMI.  General Appearance: Casual  Eye Contact::  Good  Speech:  Normal Rate  Volume:  Normal  Mood:  Euthymic  Affect:  Congruent  Thought Process:  Coherent  Orientation:  Full (Time, Place, and Person)  Thought Content:  WDL  Suicidal Thoughts:  No  Homicidal Thoughts:  No  Memory:  Immediate;   Good Recent;   Good Remote;   Good  Judgement:  Fair  Insight:  Good  Psychomotor Activity:  Normal  Concentration:  Good  Recall:  Good  Fund of Knowledge:Fair  Language: Good  Akathisia:  No  Handed:  Right  AIMS (if indicated):     Assets:  Communication Skills Desire for  Improvement Financial Resources/Insurance Leisure Time Physical Health Resilience  Sleep:      Musculoskeletal: Strength & Muscle Tone: within normal limits Gait & Station: normal Patient leans: N/A  Treatment Plan Summary: Discharge back to jail with Rx for his psychiatric medications.  Waylan Boga, [place] 03/07/2014 1:00 PM  Patient seen, evaluated and I agree with notes by Nurse Practitioner. Corena Pilgrim, MD

## 2014-03-07 NOTE — ED Notes (Signed)
Pt arrives by EMS from jail stating he is suicidal.  EMS reports he just got out of jail.

## 2014-03-07 NOTE — ED Notes (Signed)
Initial Contact - pt awake, alert, ambulating in hall without issue.  Speaking full/clear sentences.  Using phone to call family.  Pt awaiting discharge per report from previous shift.  NAD.

## 2014-03-07 NOTE — ED Notes (Signed)
Pt went to restroom and tried to give staff a cup of water as a urine sample

## 2014-03-07 NOTE — ED Notes (Signed)
Pt belongings include 1 pair shoes 1 pair black pants 1 brown belt 1 green and black shirt 1 pocket bible 1 black [employer] flip phone 32 cents

## 2014-03-07 NOTE — ED Notes (Signed)
Pt given belongings and d/c paperwork.  Pt has now decided that he is going to wait until after lunch to leave.

## 2014-03-07 NOTE — ED Notes (Signed)
Bed: [facility] Expected date:  Expected time:  Means of arrival:  Comments: 

## 2014-03-07 NOTE — ED Notes (Signed)
Took pt to bathroom but he was unable to void at this time, he has been combative and has fallen a sleep

## 2014-03-07 NOTE — ED Notes (Signed)
Pt given graham crackers,peanut butter, milk and orange juice.

## 2014-03-07 NOTE — ED Notes (Signed)
Pt eating lunch at this time

## 2014-03-07 NOTE — ED Provider Notes (Signed)
CSN: [id number]     Arrival date & time 03/07/14  7829 History   First MD Initiated Contact with Patient 03/07/14 0330     Chief Complaint  Patient presents with  . Suicidal     (Consider location/radiation/quality/duration/timing/severity/associated sxs/prior Treatment) HPI Pt with hx of shizophrenia presenting with c/o feeling suicidal and hearing voices. He states he just left jail yesterday .  Was on his medications in jail and did not receive prescriptions when he left.  Since that time he has been hearing voices and states that he is suicidal.  He then begins listing the psych facilities where he would be most happy to be transferred. He states he has no family to help him and no where to stay.  He then repeats "but I am suicidal, I'm not lying".  Denies recent alcohol or drug use.  Denies recent fever/chills, no vomiting or diarrhea, no chest or abdominal pain.  There are no other associated systemic symptoms, there are no other alleviating or modifying factors.   Past Medical History  Diagnosis Date  . Diabetes mellitus without complication   . Hypertension   . Schizophrenia   . CHF (congestive heart failure)    History reviewed. No pertinent past surgical history. Family History  Problem Relation Age of Onset  . Hypertension Other   . Diabetes Other    History  Substance Use Topics  . Smoking status: Current Every Day Smoker -- 2.00 packs/day    Types: Cigarettes  . Smokeless tobacco: Current User  . Alcohol Use: Yes     Comment: wine     Review of Systems ROS reviewed and all otherwise negative except for mentioned in HPI    Allergies  Haldol  Home Medications   Prior to Admission medications   Medication Sig Start Date End Date Taking? Authorizing Provider  acetaminophen (TYLENOL) 500 MG tablet Take 500 mg by mouth every 6 (six) hours as needed for mild pain.   Yes Historical Provider, MD  ARIPiprazole (ABILIFY) 5 MG tablet Take 1 tablet (5 mg total) by mouth  2 (two) times daily after a meal. 10/13/13  Yes Fransisca Kaufmann, NP  cloNIDine (CATAPRES) 0.2 MG tablet Take 1 tablet (0.2 mg total) by mouth 2 (two) times daily. 10/13/13  Yes Fransisca Kaufmann, NP  gabapentin (NEURONTIN) 100 MG capsule Take 2 capsules (200 mg total) by mouth 2 (two) times daily. 10/13/13  Yes Fransisca Kaufmann, NP  lisinopril (PRINIVIL,ZESTRIL) 20 MG tablet Take 1 tablet (20 mg total) by mouth daily. For high blood pressure. 10/13/13  Yes Fransisca Kaufmann, NP  metFORMIN (GLUCOPHAGE) 500 MG tablet Take 1 tablet (500 mg total) by mouth 2 (two) times daily with a meal. 10/13/13  Yes Fransisca Kaufmann, NP  traZODone (DESYREL) 100 MG tablet Take 1 tablet (100 mg total) by mouth at bedtime. 10/13/13  Yes Fransisca Kaufmann, NP  trihexyphenidyl (ARTANE) 5 MG tablet Take 1 tablet (5 mg total) by mouth 2 (two) times daily with a meal. 10/13/13  Yes Fransisca Kaufmann, NP   BP 133/76  Pulse 94  Temp(Src) 98 F (36.7 C) (Oral)  Resp 22  SpO2 100% Vitals reviewed Physical Exam Physical Examination: General appearance - alert, well appearing, and in no distress Mental status - alert, oriented to person, place, and time Eyes - no conjunctival injection, no scleral icterus Mouth - mucous membranes moist, pharynx normal without lesions Chest - clear to auscultation, no wheezes, rales or rhonchi, symmetric air entry Heart - normal rate, regular  rhythm, normal S1, S2, no murmurs, rubs, clicks or gallops Abdomen - soft, nontender, nondistended, no masses or organomegaly Extremities - peripheral pulses normal, no pedal edema, no clubbing or cyanosis Skin - normal coloration and turgor, no rashes Psych- labile mood, cooperative at times, then belligerent  ED Course  Procedures (including critical care time)  4:05 AM d/w TTS, they will evaluate patient.   4:29 AM pt has been seen by TTS, they recommend patient to be seen by psych in the morning.  Will continue to hold tonight.    6:20 AM pt out at nursing desk berating the nursing  staff.  He is not redirectable to his room.  Will give small dose of geodon so that he is not a danger to himself or to staff while he is awaiting psych consult.  Labs Review Labs Reviewed  CBC WITH DIFFERENTIAL - Abnormal; Notable for the following:    RBC 3.99 (*)    Hemoglobin 11.5 (*)    HCT 34.8 (*)    All other components within normal limits  COMPREHENSIVE METABOLIC PANEL - Abnormal; Notable for the following:    Potassium 3.4 (*)    Glucose, Bld 117 (*)    Calcium 10.6 (*)    Total Protein 8.4 (*)    All other components within normal limits  ETHANOL  URINE RAPID DRUG SCREEN (HOSP PERFORMED)    Imaging Review No results found.   EKG Interpretation None      MDM   Final diagnoses:  Schizophrenia, unspecified type    Pt presenting with c/o suicidal ideation and hearing voices.  He has multiple prior ED visits for similar symptoms.  He was released from jail yesterday and states he has no where to go.  TTS has evaluated him, he has psych holding orders written and TTS recommends him to be seen by psych during the day today for final disposition.      Ethelda Chick, MD 03/07/14 [phone]

## 2014-03-07 NOTE — ED Notes (Signed)
Pt unsuccessfully attempted to provide urine.  Pt is adamant that he provided as sample to Night Shift.  However, no sample has been obtained.

## 2014-03-10 ENCOUNTER — Emergency Department (HOSPITAL_COMMUNITY)
Admission: EM | Admit: 2014-03-10 | Discharge: 2014-03-10 | Disposition: A | Discharge status: Home / Self Care | Payer: Medicare Other | Attending: Emergency Medicine | Admitting: Emergency Medicine

## 2014-03-10 ENCOUNTER — Encounter (HOSPITAL_COMMUNITY): Payer: Self-pay | Admitting: Emergency Medicine

## 2014-03-10 DIAGNOSIS — E119 Type 2 diabetes mellitus without complications: Secondary | ICD-10-CM | POA: Insufficient documentation

## 2014-03-10 DIAGNOSIS — Z008 Encounter for other general examination: Secondary | ICD-10-CM | POA: Insufficient documentation

## 2014-03-10 DIAGNOSIS — F411 Generalized anxiety disorder: Secondary | ICD-10-CM | POA: Insufficient documentation

## 2014-03-10 DIAGNOSIS — I509 Heart failure, unspecified: Secondary | ICD-10-CM | POA: Insufficient documentation

## 2014-03-10 DIAGNOSIS — F17200 Nicotine dependence, unspecified, uncomplicated: Secondary | ICD-10-CM | POA: Insufficient documentation

## 2014-03-10 DIAGNOSIS — E876 Hypokalemia: Secondary | ICD-10-CM | POA: Insufficient documentation

## 2014-03-10 DIAGNOSIS — F1910 Other psychoactive substance abuse, uncomplicated: Secondary | ICD-10-CM

## 2014-03-10 DIAGNOSIS — F1424 Cocaine dependence with cocaine-induced mood disorder: Secondary | ICD-10-CM

## 2014-03-10 DIAGNOSIS — Z79899 Other long term (current) drug therapy: Secondary | ICD-10-CM | POA: Insufficient documentation

## 2014-03-10 DIAGNOSIS — F1410 Cocaine abuse, uncomplicated: Secondary | ICD-10-CM | POA: Insufficient documentation

## 2014-03-10 DIAGNOSIS — F200 Paranoid schizophrenia: Secondary | ICD-10-CM | POA: Insufficient documentation

## 2014-03-10 DIAGNOSIS — I10 Essential (primary) hypertension: Secondary | ICD-10-CM | POA: Insufficient documentation

## 2014-03-10 DIAGNOSIS — IMO0002 Reserved for concepts with insufficient information to code with codable children: Secondary | ICD-10-CM | POA: Insufficient documentation

## 2014-03-10 DIAGNOSIS — F1994 Other psychoactive substance use, unspecified with psychoactive substance-induced mood disorder: Secondary | ICD-10-CM

## 2014-03-10 LAB — COMPREHENSIVE METABOLIC PANEL
ALT: 9 U/L (ref 0–53)
AST: 30 U/L (ref 0–37)
Albumin: 4.6 g/dL (ref 3.5–5.2)
Alkaline Phosphatase: 75 U/L (ref 39–117)
Anion gap: 16 — ABNORMAL HIGH (ref 5–15)
BILIRUBIN TOTAL: 0.9 mg/dL (ref 0.3–1.2)
BUN: 42 mg/dL — ABNORMAL HIGH (ref 6–23)
CO2: 22 meq/L (ref 19–32)
Calcium: 10.5 mg/dL (ref 8.4–10.5)
Chloride: 100 mEq/L (ref 96–112)
Creatinine, Ser: 1.67 mg/dL — ABNORMAL HIGH (ref 0.50–1.35)
GFR calc Af Amer: 52 mL/min — ABNORMAL LOW (ref >90)
GFR calc non Af Amer: 45 mL/min — ABNORMAL LOW (ref >90)
Glucose, Bld: 177 mg/dL — ABNORMAL HIGH (ref 70–99)
Potassium: 3.5 mEq/L — ABNORMAL LOW (ref 3.7–5.3)
SODIUM: 138 meq/L (ref 137–147)
Total Protein: 8.1 g/dL (ref 6.0–8.3)

## 2014-03-10 LAB — CBC
HCT: 32.3 % — ABNORMAL LOW (ref 39.0–52.0)
HEMOGLOBIN: 10.7 g/dL — AB (ref 13.0–17.0)
MCH: 28.5 pg (ref 26.0–34.0)
MCHC: 33.1 g/dL (ref 30.0–36.0)
MCV: 86.1 fL (ref 78.0–100.0)
Platelets: 215 10*3/uL (ref 150–400)
RBC: 3.75 MIL/uL — AB (ref 4.22–5.81)
RDW: 12.7 % (ref 11.5–15.5)
WBC: 5.6 10*3/uL (ref 4.0–10.5)

## 2014-03-10 LAB — ETHANOL: Alcohol, Ethyl (B): <11 mg/dL (ref 0–11)

## 2014-03-10 LAB — RAPID URINE DRUG SCREEN, HOSP PERFORMED
AMPHETAMINES: NOT DETECTED
BARBITURATES: NOT DETECTED
Benzodiazepines: NOT DETECTED
Cocaine: POSITIVE — AB
OPIATES: NOT DETECTED
TETRAHYDROCANNABINOL: NOT DETECTED

## 2014-03-10 LAB — SALICYLATE LEVEL: Salicylate Lvl: <2 mg/dL — ABNORMAL LOW (ref 2.8–20.0)

## 2014-03-10 LAB — ACETAMINOPHEN LEVEL

## 2014-03-10 MED ORDER — LISINOPRIL 20 MG PO TABS
20.0000 mg | ORAL_TABLET | Freq: Every day | ORAL | Status: DC
  Administered 2014-03-10: 20 mg via ORAL
  Filled 2014-03-10 (×3): qty 1

## 2014-03-10 MED ORDER — ONDANSETRON HCL 4 MG PO TABS: 4.0000 mg | ORAL_TABLET | Freq: Three times a day (TID) | ORAL | Status: DC | PRN

## 2014-03-10 MED ORDER — LORAZEPAM 1 MG PO TABS: 1.0000 mg | ORAL_TABLET | Freq: Three times a day (TID) | ORAL | Status: DC | PRN

## 2014-03-10 MED ORDER — ACETAMINOPHEN 325 MG PO TABS: 650.0000 mg | ORAL_TABLET | ORAL | Status: DC | PRN

## 2014-03-10 MED ORDER — ARIPIPRAZOLE 5 MG PO TABS
5.0000 mg | ORAL_TABLET | Freq: Two times a day (BID) | ORAL | Status: DC
  Administered 2014-03-10: 5 mg via ORAL
  Filled 2014-03-10 (×2): qty 1

## 2014-03-10 MED ORDER — TRAZODONE HCL 100 MG PO TABS: 100.0000 mg | ORAL_TABLET | Freq: Every day | ORAL | Status: DC

## 2014-03-10 MED ORDER — GABAPENTIN 100 MG PO CAPS
200.0000 mg | ORAL_CAPSULE | Freq: Two times a day (BID) | ORAL | Status: DC
  Administered 2014-03-10: 200 mg via ORAL
  Filled 2014-03-10: qty 2

## 2014-03-10 MED ORDER — IBUPROFEN 200 MG PO TABS: 600.0000 mg | ORAL_TABLET | Freq: Three times a day (TID) | ORAL | Status: DC | PRN

## 2014-03-10 MED ORDER — TRIHEXYPHENIDYL HCL 5 MG PO TABS
5.0000 mg | ORAL_TABLET | Freq: Two times a day (BID) | ORAL | Status: DC
  Administered 2014-03-10: 5 mg via ORAL
  Filled 2014-03-10 (×2): qty 1

## 2014-03-10 MED ORDER — CLONIDINE HCL 0.1 MG PO TABS
0.2000 mg | ORAL_TABLET | Freq: Two times a day (BID) | ORAL | Status: DC
  Administered 2014-03-10: 0.2 mg via ORAL
  Filled 2014-03-10: qty 2

## 2014-03-10 MED ORDER — METFORMIN HCL 500 MG PO TABS
500.0000 mg | ORAL_TABLET | Freq: Two times a day (BID) | ORAL | Status: DC
  Filled 2014-03-10 (×2): qty 1

## 2014-03-10 MED ORDER — NICOTINE 21 MG/24HR TD PT24
21.0000 mg | MEDICATED_PATCH | Freq: Every day | TRANSDERMAL | Status: DC
  Filled 2014-03-10: qty 1

## 2014-03-10 NOTE — ED Provider Notes (Signed)
CSN: [id number]     Arrival date & time 03/10/14  1024 History   First MD Initiated Contact with Patient 03/10/14 1036     Chief Complaint  Patient presents with  . IVC    HPI  Patient is a 53 y.o. Male who presents to the ED for an IVC which was taken out by the Police.  Per the police the patient has been walking out in the street in traffic and has been trying to get hit by a car.  Per the police the patient is a paranoid schizophrenic who is well known to them.  Since he has been in police custody he has been extremely paranoid and believes that the police are going to poke out his eyes.  Patient denies SI and HI to me.  Patient states that he has not been taking his medications consistently but took his medication yesterday.  Patient states that he smokes, but does not drink or do drugs.    Past Medical History  Diagnosis Date  . Diabetes mellitus without complication   . Hypertension   . Schizophrenia   . CHF (congestive heart failure)    History reviewed. No pertinent past surgical history. Family History  Problem Relation Age of Onset  . Hypertension Other   . Diabetes Other    History  Substance Use Topics  . Smoking status: Current Every Day Smoker -- 2.00 packs/day    Types: Cigarettes  . Smokeless tobacco: Current User  . Alcohol Use: Yes     Comment: wine     Review of Systems Level 5 caveat   Allergies  Haldol  Home Medications   Prior to Admission medications   Medication Sig Start Date End Date Taking? Authorizing Provider  acetaminophen (TYLENOL) 500 MG tablet Take 500 mg by mouth every 6 (six) hours as needed for mild pain.   Yes Historical Provider, MD  ARIPiprazole (ABILIFY) 5 MG tablet Take 1 tablet (5 mg total) by mouth 2 (two) times daily after a meal. 03/07/14  Yes Nanine Means, NP  cloNIDine (CATAPRES) 0.2 MG tablet Take 1 tablet (0.2 mg total) by mouth 2 (two) times daily. 03/07/14  Yes Nanine Means, NP  gabapentin (NEURONTIN) 100 MG capsule Take 2  capsules (200 mg total) by mouth 2 (two) times daily. 03/07/14  Yes Nanine Means, NP  lisinopril (PRINIVIL,ZESTRIL) 20 MG tablet Take 1 tablet (20 mg total) by mouth daily. For high blood pressure. 03/07/14  Yes Nanine Means, NP  metFORMIN (GLUCOPHAGE) 500 MG tablet Take 1 tablet (500 mg total) by mouth 2 (two) times daily with a meal. 03/07/14  Yes Nanine Means, NP  traZODone (DESYREL) 100 MG tablet Take 1 tablet (100 mg total) by mouth at bedtime. 03/07/14  Yes Nanine Means, NP  trihexyphenidyl (ARTANE) 5 MG tablet Take 1 tablet (5 mg total) by mouth 2 (two) times daily with a meal. 03/07/14  Yes Nanine Means, NP   BP 120/79  Pulse 79  Temp(Src) 98 F (36.7 C) (Oral)  Resp 18  SpO2 100% Physical Exam  Nursing note and vitals reviewed. Constitutional: He is oriented to person, place, and time. He appears well-developed and well-nourished. No distress.  HENT:  Head: Normocephalic and atraumatic.  Mouth/Throat: Oropharynx is clear and moist. No oropharyngeal exudate.  Eyes: Conjunctivae and EOM are normal. Pupils are equal, round, and reactive to light. No scleral icterus.  Neck: Normal range of motion. Neck supple. No JVD present. No thyromegaly present.  Cardiovascular: Normal rate,  regular rhythm, normal heart sounds and intact distal pulses.  Exam reveals no gallop and no friction rub.   No murmur heard. Pulmonary/Chest: Effort normal and breath sounds normal. No respiratory distress. He has no wheezes. He has no rales. He exhibits no tenderness.  Abdominal: Soft. Bowel sounds are normal. He exhibits no distension and no mass. There is no tenderness. There is no rebound and no guarding.  Musculoskeletal: Normal range of motion.  Lymphadenopathy:    He has no cervical adenopathy.  Neurological: He is alert and oriented to person, place, and time. No cranial nerve deficit. Coordination normal.  Skin: Skin is warm and dry. He is not diaphoretic.  Psychiatric: His mood appears anxious. His speech  is rapid and/or pressured. He is agitated and hyperactive. Thought content is paranoid. He expresses impulsivity. He expresses no homicidal and no suicidal ideation. He expresses no suicidal plans and no homicidal plans.    ED Course  Procedures (including critical care time) Labs Review Labs Reviewed  CBC - Abnormal; Notable for the following:    RBC 3.75 (*)    Hemoglobin 10.7 (*)    HCT 32.3 (*)    All other components within normal limits  COMPREHENSIVE METABOLIC PANEL - Abnormal; Notable for the following:    Potassium 3.5 (*)    Glucose, Bld 177 (*)    BUN 42 (*)    Creatinine, Ser 1.67 (*)    GFR calc non Af Amer 45 (*)    GFR calc Af Amer 52 (*)    Anion gap 16 (*)    All other components within normal limits  SALICYLATE LEVEL - Abnormal; Notable for the following:    Salicylate Lvl <2.0 (*)    All other components within normal limits  URINE RAPID DRUG SCREEN (HOSP PERFORMED) - Abnormal; Notable for the following:    Cocaine POSITIVE (*)    All other components within normal limits  ACETAMINOPHEN LEVEL  ETHANOL    Imaging Review No results found.   EKG Interpretation None      MDM   Final diagnoses:  Paranoid schizophrenia, chronic condition  Cocaine abuse  Hypokalemia   Patient is a 53 y.o. Male who presents to the ED for an IVC taken out by the police.  Patient is paranoid here and speaking rapidly.  Physical exam is unremarkable.  Basic labs drawn here with mild hypokalemia and cocaine positive urine.  Will place the patient is a psych hold at this time.  Patient to be seen by TTS.  Patient is medically cleared at this time.    Eben Burow, PA-C 03/10/14 [id number]

## 2014-03-10 NOTE — BH Assessment (Signed)
Assessment Note  [name] is an 53 y.o. male brought in by [employer] under IVC. Pt was found yelling at women and stating he was trying to get away from tiger and he wanted snake off his pants. Pt stating he wanted to go home and then proceeded to walk out in oncoming traffic backwards, almost getting hit by numerous cars. Pt also has a piece of paper that has theories about God.  Upon assessment he is very restless and preoccupied.  He told this Clinical research associate that he was being pursued by people who were trying to trap him and skin him alive.  He states that he's been feeling like he was choking since arriving at the hospital due to all of the snakes.  He continues to make unusual waving gestures with his hands over his bedside table and is writing nonsense on his paper.  He asked this Clinical research associate whether I saw an alligator on the bed and stated, "i'm going to choke to death with all of these snakes in this room."  He thought process is coherent but tangential not irrelevant.  He denies SI/HI and drug use though he is positive for cocaine and frequently admits to cocaine abuse.  He has good eye contact, is anxious and easily distracted.  He is somewhat disheveled in scrubs and moves about the room freely except to sit and jot his "thoughts" down on paper.    He will be further evaluated by Dr Ladona Ridgel for disposition.     Axis I: Psychotic Disorder NOS Axis II: Deferred Axis III:  Past Medical History  Diagnosis Date  . Diabetes mellitus without complication   . Hypertension   . Schizophrenia   . CHF (congestive heart failure)    Axis IV: economic problems, housing problems and problems with primary support group Axis V: 21-30 behavior considerably influenced by delusions or hallucinations OR serious impairment in judgment, communication OR inability to function in almost all areas  Past Medical History:  Past Medical History  Diagnosis Date  . Diabetes mellitus without complication   . Hypertension    . Schizophrenia   . CHF (congestive heart failure)     History reviewed. No pertinent past surgical history.  Family History:  Family History  Problem Relation Age of Onset  . Hypertension Other   . Diabetes Other     Social History:  reports that he has been smoking Cigarettes.  He has been smoking about 2.00 packs per day. He uses smokeless tobacco. He reports that he drinks alcohol. He reports that he uses illicit drugs ("Crack" cocaine and Cocaine) about 7 times per week.  Additional Social History:  Substance #1 Name of Substance 1: presently denies  CIWA: CIWA-Ar BP: 120/79 mmHg Pulse Rate: 79 COWS:    Allergies:  Allergies  Allergen Reactions  . Haldol [Haloperidol] Other (See Comments)    Muscle spasms, loss of voluntary movement    Home Medications:  (Not in a hospital admission)  OB/GYN Status:  No LMP for male patient.  General Assessment Data Location of Assessment: [facility] ED Is this a Tele or Face-to-Face Assessment?: Face-to-Face Is this an Initial Assessment or a Re-assessment for this encounter?: Initial Assessment Living Arrangements: Other (Comment) (homeless) Can pt return to current living arrangement?: Yes Admission Status: Voluntary Is patient capable of signing voluntary admission?: Yes Transfer from: Acute Hospital Referral Source: Self/Family/Friend     [facility] Crisis Care Plan Living Arrangements: Other (Comment) (homeless) Name of Psychiatrist: [name] Name of Therapist:  NA  Education Status Is patient currently in school?: No Highest grade of school patient has completed: 45 Name of school: NA Contact person: NA  Risk to self with the past 6 months Suicidal Ideation: No Suicidal Intent: No Is patient at risk for suicide?: No Suicidal Plan?: No Access to Means: No What has been your use of drugs/alcohol within the last 12 months?: ongoing Previous Attempts/Gestures: No (presently denies) Triggers for Past Attempts: None  known Intentional Self Injurious Behavior: None Family Suicide History: No Recent stressful life event(s): Other (Comment) (believes people are trying to skin him alive) Persecutory voices/beliefs?: Yes Depression: No Substance abuse history and/or treatment for substance abuse?: Yes Suicide prevention information given to non-admitted patients: Not applicable  Risk to Others within the past 6 months Homicidal Ideation: No Thoughts of Harm to Others: No Current Homicidal Intent: No Current Homicidal Plan: No Access to Homicidal Means: No History of harm to others?: No Assessment of Violence: None Noted Does patient have access to weapons?: No Criminal Charges Pending?: No Does patient have a court date: No  Psychosis Hallucinations: Auditory;Tactile;Visual (reports feeling like he's being choked by snakes, sees allig) Delusions: Persecutory;Somatic (being choked by snakes, people trying to skin alive)  Mental Status Report Appear/Hygiene: Disheveled;In scrubs Eye Contact: Good Motor Activity: Agitation;Restlessness;Mannerisms Speech: Pressured Level of Consciousness: Alert Mood: Anxious Affect: Fearful;Preoccupied Anxiety Level: Moderate Thought Processes: Coherent Judgement: Impaired Orientation: Person;Place Obsessive Compulsive Thoughts/Behaviors: Moderate  Cognitive Functioning Concentration: Decreased Memory: Recent Impaired;Remote Impaired IQ: Average Insight: Poor Impulse Control: Poor Appetite: Good Sleep:  (couldn't answer) Vegetative Symptoms: Decreased grooming  ADLScreening [facility] Assessment Services) Patient's cognitive ability adequate to safely complete daily activities?: No Patient able to express need for assistance with ADLs?: No Independently performs ADLs?: No  Prior Inpatient Therapy Prior Inpatient Therapy: Yes Prior Therapy Dates: "1986-present" Prior Therapy Facilty/Provider(s): [facility]; [facility]; [facility] Reason for Treatment:  Schizophrenia  Prior Outpatient Therapy Prior Outpatient Therapy: Yes Prior Therapy Dates: 2015 Prior Therapy Facilty/Provider(s): [name] Reason for Treatment: Schizophrenia   ADL Screening (condition at time of admission) Patient's cognitive ability adequate to safely complete daily activities?: No Patient able to express need for assistance with ADLs?: No Independently performs ADLs?: No       Abuse/Neglect Assessment (Assessment to be complete while patient is alone) Physical Abuse: Denies Verbal Abuse: Denies Sexual Abuse: Denies Values / Beliefs Cultural Requests During Hospitalization: None Spiritual Requests During Hospitalization: None   Advance Directives (For Healthcare) Does patient have an advance directive?: No Would patient like information on creating an advanced directive?: No - patient declined information Nutrition Screen- [facility] Patient's home diet: Regular  Additional Information 1:1 In Past 12 Months?: No CIRT Risk: No Elopement Risk: No Does patient have medical clearance?: Yes     Disposition:  Disposition Initial Assessment Completed for this Encounter: Yes  On Site Evaluation by:   Reviewed with Physician:    Steward Ros 03/10/2014 12:30 PM

## 2014-03-10 NOTE — ED Provider Notes (Signed)
  Face-to-face evaluation  Seen by me, at 17:20- to evaluate for upholding IVC petition, and possibly discharging  History: At this time. The patient is alert, calm, cooperative. He remembers doing cocaine today, and coming here with the police. He is not suicidal at this time. He is taking his medications, which are prescribed, to him, by his psychiatrist.  Physical exam: Alert, calm, cooperative. The patient is lucid, oriented, and apparently at his baseline. He is not in respiratory distress. There is no focal neurologic abnormality.  Medical screening examination/treatment/procedure(s) were conducted as a shared visit with non-physician practitioner(s) and myself.  I personally evaluated the patient during the encounter  Flint Melter, MD 03/10/14 1731

## 2014-03-10 NOTE — ED Notes (Signed)
Pt brought in by [employer], pt IVC'd. Pt was found yelling at women and stating he was trying to get away from tiger and he wanted snake off his pants. Pt stating he wanted to go home and then proceeded to walk out in oncoming traffic backwards, almost getting hit by numerous cars. Pt also has a piece of paper that has theories about God.

## 2014-03-10 NOTE — Progress Notes (Signed)
PHARMACIST - PHYSICIAN COMMUNICATION DR:  EDP CONCERNING:  METFORMIN SAFE ADMINISTRATION POLICY  RECOMMENDATION: Metformin has been placed on DISCONTINUE (rejected order) STATUS and should be reordered only after any of the conditions below are ruled out.  Current safety recommendations include avoiding metformin for a minimum of 48 hours after the patient's exposure to intravenous contrast media.  DESCRIPTION:  The Pharmacy Committee has adopted a policy that restricts the use of metformin in hospitalized patients until all the contraindications to administration have been ruled out. Specific contraindications are:  Serum creatinine ? 1.5 for males  Serum creatinine ? 1.4 for females  Shock, acute MI, sepsis, hypoxemia, dehydration  Planned administration of intravenous iodinated contrast media  Heart Failure patients with low EF  Acute or chronic metabolic acidosis (including DKA)     Juliette Alcide, PharmD, BCPS.   Pager: [phone] 03/10/2014. Now

## 2014-03-10 NOTE — ED Provider Notes (Signed)
Medical screening examination/treatment/procedure(s) were performed by non-physician practitioner and as supervising physician I was immediately available for consultation/collaboration.  Mela Perham, MD 03/10/14 2020 

## 2014-03-10 NOTE — ED Notes (Signed)
Patient discharged to home.  All belongings returned.  Denies suicidal ideation or thoughts of hurting others.  Patient was given a bus pass per request.

## 2014-03-10 NOTE — Consult Note (Signed)
[facility] Face-to-Face Psychiatry Consult   Reason for Consult:  Substance abuse Referring Physician:  EDP  [name] is an 53 y.o. male. Total Time spent with patient: 30 minutes  Assessment: AXIS I:  Substance Abuse and Substance Induced Mood Disorder AXIS II:  Deferred AXIS III:   Past Medical History  Diagnosis Date  . Diabetes mellitus without complication   . Hypertension   . Schizophrenia   . CHF (congestive heart failure)    AXIS IV:  other psychosocial or environmental problems AXIS V:  61-70 mild symptoms  Plan:  No evidence of imminent risk to self or others at present.   Patient does not meet criteria for psychiatric inpatient admission. Supportive therapy provided about ongoing stressors. Discussed crisis plan, support from social network, calling 911, coming to the Emergency Department, and calling Suicide Hotline.  Subjective:   [name] is a 53 y.o. male patient.  HPI:  Patient presents to the [facility] stating that he is having hallucinations.  Patient would stand in hall and when he notice someone was watching patient would start acting that he was hearing voices and making statement like "I want the lethal injection, act like he was trying to get away from tiger, and snakes.  Patient is familiar with this interviewer and only states that he is tired of seeing black on black crime.  Patient then state you know the decision is in your hands.  During interview patient denies hallucinations, paranoia, and suicidal/homicidal ideation.  Patient is calm and resting in chair.  Patient continues to  Use crack cocaine.  Patient also states that he smoked a roach (marijuana).    HPI Elements:   Location:  psychotic disorder. Quality:  patient stating that he is hallucinating. Severity:  patient stating that he is hallucinating. Timing:  1 day. Review of Systems  HENT: Negative.   Respiratory: Negative.   Musculoskeletal: Negative.   Psychiatric/Behavioral:  Positive for substance abuse. Negative for depression, suicidal ideas, hallucinations and memory loss. The patient is not nervous/anxious and does not have insomnia.    Family History  Problem Relation Age of Onset  . Hypertension Other   . Diabetes Other     Past Psychiatric History: Past Medical History  Diagnosis Date  . Diabetes mellitus without complication   . Hypertension   . Schizophrenia   . CHF (congestive heart failure)     reports that he has been smoking Cigarettes.  He has been smoking about 2.00 packs per day. He uses smokeless tobacco. He reports that he drinks alcohol. He reports that he uses illicit drugs ("Crack" cocaine and Cocaine) about 7 times per week. Family History  Problem Relation Age of Onset  . Hypertension Other   . Diabetes Other    Family History Substance Abuse: No Family Supports: No Living Arrangements: Other (Comment) (homeless) Can pt return to current living arrangement?: Yes Abuse/Neglect [facility]) Physical Abuse: Denies Verbal Abuse: Denies Sexual Abuse: Denies Allergies:   Allergies  Allergen Reactions  . Haldol [Haloperidol] Other (See Comments)    Muscle spasms, loss of voluntary movement    ACT Assessment Complete:  Yes:    Educational Status    Risk to Self: Risk to self with the past 6 months Suicidal Ideation: No Suicidal Intent: No Is patient at risk for suicide?: No Suicidal Plan?: No Access to Means: No What has been your use of drugs/alcohol within the last 12 months?: ongoing Previous Attempts/Gestures: No (presently denies) Triggers for Past Attempts:  None known Intentional Self Injurious Behavior: None Family Suicide History: No Recent stressful life event(s): Other (Comment) (believes people are trying to skin him alive) Persecutory voices/beliefs?: Yes Depression: No Substance abuse history and/or treatment for substance abuse?: Yes Suicide prevention information given to non-admitted patients: Not applicable   Risk to Others: Risk to Others within the past 6 months Homicidal Ideation: No Thoughts of Harm to Others: No Current Homicidal Intent: No Current Homicidal Plan: No Access to Homicidal Means: No History of harm to others?: No Assessment of Violence: None Noted Does patient have access to weapons?: No Criminal Charges Pending?: No Does patient have a court date: No  Abuse: Abuse/Neglect Assessment (Assessment to be complete while patient is alone) Physical Abuse: Denies Verbal Abuse: Denies Sexual Abuse: Denies  Prior Inpatient Therapy: Prior Inpatient Therapy Prior Inpatient Therapy: Yes Prior Therapy Dates: "1986-present" Prior Therapy Facilty/Provider(s): [facility]ie [facility]; [facility]; [place] Reason for Treatment: Schizophrenia  Prior Outpatient Therapy: Prior Outpatient Therapy Prior Outpatient Therapy: Yes Prior Therapy Dates: 2015 Prior Therapy Facilty/Provider(s): [name] Reason for Treatment: Schizophrenia   Additional Information: Additional Information 1:1 In Past 12 Months?: No CIRT Risk: No Elopement Risk: No Does patient have medical clearance?: Yes     Objective: Blood pressure 120/79, pulse 79, temperature 98 F (36.7 C), temperature source Oral, resp. rate 18, SpO2 100.00%.There is no weight on file to calculate BMI. Results for orders placed during the hospital encounter of 03/10/14 (from the past 72 hour(s))  ACETAMINOPHEN LEVEL     Status: None   Collection Time    03/10/14 10:55 AM      Result Value Ref Range   Acetaminophen (Tylenol), Serum <15.0  10 - 30 ug/mL   Comment:            THERAPEUTIC CONCENTRATIONS VARY     SIGNIFICANTLY. A RANGE OF 10-30     ug/mL MAY BE AN EFFECTIVE     CONCENTRATION FOR MANY PATIENTS.     HOWEVER, SOME ARE BEST TREATED     AT CONCENTRATIONS OUTSIDE THIS     RANGE.     ACETAMINOPHEN CONCENTRATIONS     >150 ug/mL AT 4 HOURS AFTER     INGESTION AND >50 ug/mL AT 12     HOURS AFTER INGESTION ARE     OFTEN ASSOCIATED  WITH TOXIC     REACTIONS.  CBC     Status: Abnormal   Collection Time    03/10/14 10:55 AM      Result Value Ref Range   WBC 5.6  4.0 - 10.5 K/uL   RBC 3.75 (*) 4.22 - 5.81 MIL/uL   Hemoglobin 10.7 (*) 13.0 - 17.0 g/dL   HCT 32.3 (*) 39.0 - 52.0 %   MCV 86.1  78.0 - 100.0 fL   MCH 28.5  26.0 - 34.0 pg   MCHC 33.1  30.0 - 36.0 g/dL   RDW 12.7  11.5 - 15.5 %   Platelets 215  150 - 400 K/uL  COMPREHENSIVE METABOLIC PANEL     Status: Abnormal   Collection Time    03/10/14 10:55 AM      Result Value Ref Range   Sodium 138  137 - 147 mEq/L   Potassium 3.5 (*) 3.7 - 5.3 mEq/L   Chloride 100  96 - 112 mEq/L   CO2 22  19 - 32 mEq/L   Glucose, Bld 177 (*) 70 - 99 mg/dL   BUN 42 (*) 6 - 23 mg/dL  Creatinine, Ser 1.67 (*) 0.50 - 1.35 mg/dL   Calcium 10.5  8.4 - 10.5 mg/dL   Total Protein 8.1  6.0 - 8.3 g/dL   Albumin 4.6  3.5 - 5.2 g/dL   AST 30  0 - 37 U/L   ALT 9  0 - 53 U/L   Alkaline Phosphatase 75  39 - 117 U/L   Total Bilirubin 0.9  0.3 - 1.2 mg/dL   GFR calc non Af Amer 45 (*) >90 mL/min   GFR calc Af Amer 52 (*) >90 mL/min   Comment: (NOTE)     The eGFR has been calculated using the CKD EPI equation.     This calculation has not been validated in all clinical situations.     eGFR's persistently <90 mL/min signify possible Chronic Kidney     Disease.   Anion gap 16 (*) 5 - 15  ETHANOL     Status: None   Collection Time    03/10/14 10:55 AM      Result Value Ref Range   Alcohol, Ethyl (B) <11  0 - 11 mg/dL   Comment:            LOWEST DETECTABLE LIMIT FOR     SERUM ALCOHOL IS 11 mg/dL     FOR MEDICAL PURPOSES ONLY  SALICYLATE LEVEL     Status: Abnormal   Collection Time    03/10/14 10:55 AM      Result Value Ref Range   Salicylate Lvl <5.5 (*) 2.8 - 20.0 mg/dL  URINE RAPID DRUG SCREEN (HOSP PERFORMED)     Status: Abnormal   Collection Time    03/10/14 11:16 AM      Result Value Ref Range   Opiates NONE DETECTED  NONE DETECTED   Cocaine POSITIVE (*) NONE DETECTED    Benzodiazepines NONE DETECTED  NONE DETECTED   Amphetamines NONE DETECTED  NONE DETECTED   Tetrahydrocannabinol NONE DETECTED  NONE DETECTED   Barbiturates NONE DETECTED  NONE DETECTED   Comment:            DRUG SCREEN FOR MEDICAL PURPOSES     ONLY.  IF CONFIRMATION IS NEEDED     FOR ANY PURPOSE, NOTIFY LAB     WITHIN 5 DAYS.                LOWEST DETECTABLE LIMITS     FOR URINE DRUG SCREEN     Drug Class       Cutoff (ng/mL)     Amphetamine      1000     Barbiturate      200     Benzodiazepine   732     Tricyclics       202     Opiates          300     Cocaine          300     THC              50   Labs are reviewed see above values.  Medications reviewed and no changes made.  Current Facility-Administered Medications  Medication Dose Route Frequency Provider Last Rate Last Dose  . acetaminophen (TYLENOL) tablet 650 mg  650 mg Oral Q4H PRN Courtney A Forcucci, PA-C      . ARIPiprazole (ABILIFY) tablet 5 mg  5 mg Oral BID PC Courtney A Forcucci, PA-C      . cloNIDine (CATAPRES) tablet 0.2 mg  0.2  mg Oral BID Courtney A Forcucci, PA-C   0.2 mg at 03/10/14 1234  . gabapentin (NEURONTIN) capsule 200 mg  200 mg Oral BID Courtney A Forcucci, PA-C   200 mg at 03/10/14 1234  . ibuprofen (ADVIL,MOTRIN) tablet 600 mg  600 mg Oral Q8H PRN Courtney A Forcucci, PA-C      . lisinopril (PRINIVIL,ZESTRIL) tablet 20 mg  20 mg Oral Daily Courtney A Forcucci, PA-C      . LORazepam (ATIVAN) tablet 1 mg  1 mg Oral Q8H PRN Courtney A Forcucci, PA-C      . nicotine (NICODERM CQ - dosed in mg/24 hours) patch 21 mg  21 mg Transdermal Daily Courtney A Forcucci, PA-C      . ondansetron (ZOFRAN) tablet 4 mg  4 mg Oral Q8H PRN Courtney A Forcucci, PA-C      . traZODone (DESYREL) tablet 100 mg  100 mg Oral QHS Courtney A Forcucci, PA-C      . trihexyphenidyl (ARTANE) tablet 5 mg  5 mg Oral BID WC Courtney A Forcucci, PA-C       Current Outpatient Prescriptions  Medication Sig Dispense Refill  .  acetaminophen (TYLENOL) 500 MG tablet Take 500 mg by mouth every 6 (six) hours as needed for mild pain.      . ARIPiprazole (ABILIFY) 5 MG tablet Take 1 tablet (5 mg total) by mouth 2 (two) times daily after a meal.  60 tablet  0  . cloNIDine (CATAPRES) 0.2 MG tablet Take 1 tablet (0.2 mg total) by mouth 2 (two) times daily.  60 tablet  0  . gabapentin (NEURONTIN) 100 MG capsule Take 2 capsules (200 mg total) by mouth 2 (two) times daily.  120 capsule  0  . lisinopril (PRINIVIL,ZESTRIL) 20 MG tablet Take 1 tablet (20 mg total) by mouth daily. For high blood pressure.  30 tablet  0  . metFORMIN (GLUCOPHAGE) 500 MG tablet Take 1 tablet (500 mg total) by mouth 2 (two) times daily with a meal.  60 tablet  0  . traZODone (DESYREL) 100 MG tablet Take 1 tablet (100 mg total) by mouth at bedtime.  30 tablet  0  . trihexyphenidyl (ARTANE) 5 MG tablet Take 1 tablet (5 mg total) by mouth 2 (two) times daily with a meal.  60 tablet  0    Psychiatric Specialty Exam:     Blood pressure 120/79, pulse 79, temperature 98 F (36.7 C), temperature source Oral, resp. rate 18, SpO2 100.00%.There is no weight on file to calculate BMI.  General Appearance: Casual  Eye Contact::  Good  Speech:  Clear and Coherent  Volume:  Normal  Mood:  Anxious  Affect:  Congruent  Thought Process:  Circumstantial  Orientation:  Full (Time, Place, and Person)  Thought Content:  Rumination  Suicidal Thoughts:  No  Homicidal Thoughts:  No  Memory:  Immediate;   Fair Recent;   Fair Remote;   Fair  Judgement:  Fair  Insight:  Fair  Psychomotor Activity:  Normal  Concentration:  Poor  Recall:  [employer] of Knowledge:Fair  Language: Fair  Akathisia:  No  Handed:  Right  AIMS (if indicated):     Assets:  Desire for Improvement  Sleep:      Musculoskeletal: Strength & Muscle Tone: within normal limits Gait & Station: normal Patient leans: N/A  Consulted with Dr. Parke Poisson and he is in agreement with plan to discharge  and follow up with [facility].   Treatment Plan  Summary: Discharge home recommended.  Patient to follow up with Nicholaus Bloom, [place], FNP-BC 03/10/2014 4:26 PM  Case and Disposition discussed with me

## 2014-03-10 NOTE — [facility] Suicide Risk Assessment (Cosign Needed)
Suicide Risk Assessment  Discharge Assessment     Demographic Factors:  Male and Black  Total Time spent with patient: 30 minutes  Psychiatric Specialty Exam:     Blood pressure 120/79, pulse 79, temperature 98 F (36.7 C), temperature source Oral, resp. rate 18, SpO2 100.00%.There is no weight on file to calculate BMI.   General Appearance: Casual   Eye Contact:: Good   Speech: Clear and Coherent   Volume: Normal   Mood: Anxious   Affect: Congruent   Thought Process: Circumstantial   Orientation: Full (Time, Place, and Person)   Thought Content: Rumination   Suicidal Thoughts: No   Homicidal Thoughts: No   Memory: Immediate; Fair  Recent; Fair  Remote; Fair   Judgement: Fair   Insight: Fair   Psychomotor Activity: Normal   Concentration: Poor   Recall: [employer] of Knowledge:Fair   Language: Fair   Akathisia: No   Handed: Right   AIMS (if indicated):   Assets: Desire for Improvement   Sleep:   Musculoskeletal:  Strength & Muscle Tone: within normal limits  Gait & Station: normal  Patient leans: N/A   Mental Status Per Nursing Assessment::   On Admission:     Current Mental Status by Physician: Patient denies suicidal/homicidal ideation, psychosis, and paranoia  Loss Factors: NA  Historical Factors: NA  Risk Reduction Factors:   NA  Continued Clinical Symptoms:  Alcohol/Substance Abuse/Dependencies  Cognitive Features That Contribute To Risk:  Closed-mindedness    Suicide Risk:  Minimal: No identifiable suicidal ideation.  Patients presenting with no risk factors but with morbid ruminations; may be classified as minimal risk based on the severity of the depressive symptoms  Discharge Diagnoses:  AXIS I: Substance Abuse and Substance Induced Mood Disorder  AXIS II: Deferred  AXIS III:  Past Medical History   Diagnosis  Date   .  Diabetes mellitus without complication    .  Hypertension    .  Schizophrenia    .  CHF (congestive heart  failure)     AXIS IV: other psychosocial or environmental problems  AXIS V: 61-70 mild symptoms     Plan Of Care/Follow-up recommendations:  Activity:  as tolerated Diet:  as tolerated  Is patient on multiple antipsychotic therapies at discharge:  No   Has Patient had three or more failed trials of antipsychotic monotherapy by history:  No  Recommended Plan for Multiple Antipsychotic Therapies: NA    Joee Iovine, FNP-BC 03/10/2014, 5:02 PM

## 2014-03-12 ENCOUNTER — Encounter (HOSPITAL_COMMUNITY): Payer: Self-pay | Admitting: Emergency Medicine

## 2014-03-12 ENCOUNTER — Emergency Department (HOSPITAL_COMMUNITY)
Admission: EM | Admit: 2014-03-12 | Discharge: 2014-03-12 | Disposition: A | Discharge status: Home / Self Care | Payer: Medicare Other | Attending: Emergency Medicine | Admitting: Emergency Medicine

## 2014-03-12 DIAGNOSIS — I10 Essential (primary) hypertension: Secondary | ICD-10-CM | POA: Insufficient documentation

## 2014-03-12 DIAGNOSIS — F209 Schizophrenia, unspecified: Secondary | ICD-10-CM | POA: Insufficient documentation

## 2014-03-12 DIAGNOSIS — IMO0002 Reserved for concepts with insufficient information to code with codable children: Secondary | ICD-10-CM | POA: Insufficient documentation

## 2014-03-12 DIAGNOSIS — F17200 Nicotine dependence, unspecified, uncomplicated: Secondary | ICD-10-CM | POA: Insufficient documentation

## 2014-03-12 DIAGNOSIS — Z79899 Other long term (current) drug therapy: Secondary | ICD-10-CM | POA: Insufficient documentation

## 2014-03-12 DIAGNOSIS — X58XXXA Exposure to other specified factors, initial encounter: Secondary | ICD-10-CM | POA: Insufficient documentation

## 2014-03-12 DIAGNOSIS — I509 Heart failure, unspecified: Secondary | ICD-10-CM | POA: Insufficient documentation

## 2014-03-12 DIAGNOSIS — E119 Type 2 diabetes mellitus without complications: Secondary | ICD-10-CM | POA: Insufficient documentation

## 2014-03-12 DIAGNOSIS — Z008 Encounter for other general examination: Secondary | ICD-10-CM | POA: Insufficient documentation

## 2014-03-12 DIAGNOSIS — S90424A Blister (nonthermal), right lesser toe(s), initial encounter: Secondary | ICD-10-CM

## 2014-03-12 DIAGNOSIS — M76899 Other specified enthesopathies of unspecified lower limb, excluding foot: Secondary | ICD-10-CM | POA: Insufficient documentation

## 2014-03-12 DIAGNOSIS — Y929 Unspecified place or not applicable: Secondary | ICD-10-CM | POA: Insufficient documentation

## 2014-03-12 DIAGNOSIS — Y9389 Activity, other specified: Secondary | ICD-10-CM | POA: Insufficient documentation

## 2014-03-12 MED ORDER — ACETAMINOPHEN 325 MG PO TABS
650.0000 mg | ORAL_TABLET | Freq: Once | ORAL | Status: AC
  Administered 2014-03-12: 650 mg via ORAL
  Filled 2014-03-12: qty 2

## 2014-03-12 NOTE — ED Notes (Signed)
Wound dressed with bacitracin and bandaid. 

## 2014-03-12 NOTE — Discharge Instructions (Signed)
    Blisters Blisters are fluid-filled sacs that form within the skin. Common causes of blistering are friction, burns, and exposure to irritating chemicals. The fluid in the blister protects the underlying damaged skin. Most of the time it is not recommended that you open blisters. When a blister is opened, there is an increased chance for infection. Usually, a blister will open on its own. They then dry up and peel off within 10 days. If the blister is tense and uncomfortable (painful) the fluid may be drained. If it is drained the roof of the blister should be left intact. The draining should only be done by a medical professional under aseptic conditions. Poorly fitting shoes and boots can cause blisters by being too tight or too loose. Wearing extra socks or using tape, bandages, or pads over the blister-prone area helps prevent the problem by reducing friction. Blisters heal more slowly if you have diabetes or if you have problems with your circulation. You need to be careful about medical follow-up to prevent infection. HOME CARE INSTRUCTIONS  Protect areas where blisters have formed until the skin is healed. Use a special bandage with a hole cut in the middle around the blister. This reduces pressure and friction. When the blister breaks, trim off the loose skin and keep the area clean by washing it with soap daily. Soaking the blister or broken-open blister with diluted vinegar twice daily for 15 minutes will dry it up and speed the healing. Use 3 tablespoons of white vinegar per quart of water (45 mL white vinegar per liter of water). An antibiotic ointment and a bandage can be used to cover the area after soaking.  SEEK MEDICAL CARE IF:   You develop increased redness, pain, swelling, or drainage in the blistered area.  You develop a pus-like discharge from the blistered area, chills, or a fever. MAKE SURE YOU:   Understand these instructions.  Will watch your condition.  Will get help  right away if you are not doing well or get worse. Document Released: 07/25/2004 Document Revised: 09/09/2011 Document Reviewed: 06/22/2008 ExitCare Patient Information 2015 [employer]. This information is not intended to replace advice given to you by your health care provider. Make sure you discuss any questions you have with your health care provider.  

## 2014-03-12 NOTE — ED Notes (Signed)
Bed: WTR5 Expected date:  Expected time:  Means of arrival:  Comments: 

## 2014-03-12 NOTE — ED Notes (Signed)
Pt states he would like some tylenol, peanut butter and crackers, and a bus pass so he can sit in the lobby till he can catch the bus in the morning.

## 2014-03-12 NOTE — ED Notes (Signed)
Pt requesting tylenol, a bucket with soap and water and a bus pass. Pt denies SI/HI

## 2014-03-12 NOTE — ED Notes (Addendum)
Pt transported by EMS from convenience store with c/o bilat foot pain and requesting psychiatric evaluation.  Pt states in the future he will not smoke crack he will chew gum.

## 2014-03-12 NOTE — ED Provider Notes (Signed)
CSN: [id number]     Arrival date & time 03/12/14  0044 History   First MD Initiated Contact with Patient 03/12/14 0252     Chief Complaint  Patient presents with  . Medical Clearance  . Foot Pain     (Consider location/radiation/quality/duration/timing/severity/associated sxs/prior Treatment) HPI 53 year old male presents to emergency room via EMS with complaint of foot pain.  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  Patient is requesting Tylenol for his foot pain.  He reports he's been wearing too tight shoes.  He does have some blistering on his right fifth toe.  Patient also requesting to be able to sit in the lobby until the bus runs in the morning. Past Medical History  Diagnosis Date  . Diabetes mellitus without complication   . Hypertension   . Schizophrenia   . CHF (congestive heart failure)    History reviewed. No pertinent past surgical history. Family History  Problem Relation Age of Onset  . Hypertension Other   . Diabetes Other    History  Substance Use Topics  . Smoking status: Current Every Day Smoker -- 2.00 packs/day    Types: Cigarettes  . Smokeless tobacco: Current User  . Alcohol Use: Yes     Comment: wine     Review of Systems  See History of Present Illness; otherwise all other systems are reviewed and negative   Allergies  Haldol  Home Medications   Prior to Admission medications   Medication Sig Start Date End Date Taking? Authorizing Provider  acetaminophen (TYLENOL) 500 MG tablet Take 500 mg by mouth every 6 (six) hours as needed for mild pain.   Yes Historical Provider, MD  ARIPiprazole (ABILIFY) 5 MG tablet Take 1 tablet (5 mg total) by mouth 2 (two) times daily after a meal. 03/07/14  Yes Nanine Means, NP  cloNIDine (CATAPRES) 0.2 MG tablet Take 1 tablet (0.2 mg total) by mouth 2 (two) times daily. 03/07/14   Nanine Means, NP  gabapentin (NEURONTIN) 100 MG  capsule Take 2 capsules (200 mg total) by mouth 2 (two) times daily. 03/07/14   Nanine Means, NP  lisinopril (PRINIVIL,ZESTRIL) 20 MG tablet Take 1 tablet (20 mg total) by mouth daily. For high blood pressure. 03/07/14   Nanine Means, NP  metFORMIN (GLUCOPHAGE) 500 MG tablet Take 1 tablet (500 mg total) by mouth 2 (two) times daily with a meal. 03/07/14   Nanine Means, NP  traZODone (DESYREL) 100 MG tablet Take 1 tablet (100 mg total) by mouth at bedtime. 03/07/14   Nanine Means, NP  trihexyphenidyl (ARTANE) 5 MG tablet Take 1 tablet (5 mg total) by mouth 2 (two) times daily with a meal. 03/07/14   Nanine Means, NP   BP 138/72  Pulse 64  Temp(Src) 98.4 F (36.9 C) (Oral)  Resp 20  SpO2 100% Physical Exam  Nursing note and vitals reviewed. Constitutional: He appears well-developed. No distress.  Cardiovascular: Normal rate, regular rhythm, normal heart sounds and intact distal pulses.  Exam reveals no gallop and no friction rub.   No murmur heard. Pulmonary/Chest: Effort normal and breath sounds normal. No respiratory distress. He has no wheezes. He has no rales. He exhibits no tenderness.  Musculoskeletal: Normal range of motion. He exhibits tenderness. He exhibits no edema.  Patient has some maceration of his toes, unroofed blister on right fifth toe no signs of cellulitis or infection  Skin: Skin is warm  and dry. No rash noted. He is not diaphoretic. No erythema. No pallor.    ED Course  Procedures (including critical care time) Labs Review Labs Reviewed - No data to display  Imaging Review No results found.   EKG Interpretation None      MDM   Final diagnoses:  Blister of toe of right foot, initial encounter   53 year old male with poorly fitting shoes and blisters.  Patient to receive Band-Aid and Tylenol.  Patient has eating and drinking quite here.  Plan to discharge  Olivia Mackie, MD 03/12/14 [phone]

## 2014-03-12 NOTE — ED Notes (Signed)
Pt told if he is still here in the am he will be given bus pass.

## 2014-03-16 ENCOUNTER — Emergency Department (HOSPITAL_COMMUNITY)
Admission: EM | Admit: 2014-03-16 | Discharge: 2014-03-16 | Disposition: A | Discharge status: Home / Self Care | Payer: Medicare Other | Attending: Emergency Medicine | Admitting: Emergency Medicine

## 2014-03-16 ENCOUNTER — Encounter (HOSPITAL_COMMUNITY): Payer: Self-pay | Admitting: Emergency Medicine

## 2014-03-16 DIAGNOSIS — F411 Generalized anxiety disorder: Secondary | ICD-10-CM | POA: Insufficient documentation

## 2014-03-16 DIAGNOSIS — E1142 Type 2 diabetes mellitus with diabetic polyneuropathy: Secondary | ICD-10-CM | POA: Insufficient documentation

## 2014-03-16 DIAGNOSIS — F1910 Other psychoactive substance abuse, uncomplicated: Secondary | ICD-10-CM

## 2014-03-16 DIAGNOSIS — E1140 Type 2 diabetes mellitus with diabetic neuropathy, unspecified: Secondary | ICD-10-CM

## 2014-03-16 DIAGNOSIS — E1149 Type 2 diabetes mellitus with other diabetic neurological complication: Secondary | ICD-10-CM | POA: Insufficient documentation

## 2014-03-16 DIAGNOSIS — F17200 Nicotine dependence, unspecified, uncomplicated: Secondary | ICD-10-CM | POA: Insufficient documentation

## 2014-03-16 DIAGNOSIS — I10 Essential (primary) hypertension: Secondary | ICD-10-CM | POA: Insufficient documentation

## 2014-03-16 DIAGNOSIS — M79672 Pain in left foot: Secondary | ICD-10-CM

## 2014-03-16 DIAGNOSIS — R45851 Suicidal ideations: Secondary | ICD-10-CM | POA: Insufficient documentation

## 2014-03-16 DIAGNOSIS — F1994 Other psychoactive substance use, unspecified with psychoactive substance-induced mood disorder: Secondary | ICD-10-CM | POA: Diagnosis present

## 2014-03-16 DIAGNOSIS — M79609 Pain in unspecified limb: Secondary | ICD-10-CM | POA: Insufficient documentation

## 2014-03-16 DIAGNOSIS — Z79899 Other long term (current) drug therapy: Secondary | ICD-10-CM | POA: Insufficient documentation

## 2014-03-16 DIAGNOSIS — I509 Heart failure, unspecified: Secondary | ICD-10-CM | POA: Insufficient documentation

## 2014-03-16 DIAGNOSIS — F209 Schizophrenia, unspecified: Secondary | ICD-10-CM | POA: Insufficient documentation

## 2014-03-16 DIAGNOSIS — M79671 Pain in right foot: Secondary | ICD-10-CM

## 2014-03-16 DIAGNOSIS — F1410 Cocaine abuse, uncomplicated: Secondary | ICD-10-CM | POA: Diagnosis present

## 2014-03-16 DIAGNOSIS — F39 Unspecified mood [affective] disorder: Secondary | ICD-10-CM | POA: Insufficient documentation

## 2014-03-16 HISTORY — DX: Polyneuropathy, unspecified: G62.9

## 2014-03-16 LAB — CBC WITH DIFFERENTIAL/PLATELET
BASOS ABS: 0 10*3/uL (ref 0.0–0.1)
BASOS PCT: 0 % (ref 0–1)
EOS PCT: 2 % (ref 0–5)
Eosinophils Absolute: 0.1 10*3/uL (ref 0.0–0.7)
HCT: 31.9 % — ABNORMAL LOW (ref 39.0–52.0)
Hemoglobin: 10.3 g/dL — ABNORMAL LOW (ref 13.0–17.0)
Lymphocytes Relative: 23 % (ref 12–46)
Lymphs Abs: 1.1 10*3/uL (ref 0.7–4.0)
MCH: 28.9 pg (ref 26.0–34.0)
MCHC: 32.3 g/dL (ref 30.0–36.0)
MCV: 89.4 fL (ref 78.0–100.0)
Monocytes Absolute: 0.4 10*3/uL (ref 0.1–1.0)
Monocytes Relative: 7 % (ref 3–12)
NEUTROS PCT: 68 % (ref 43–77)
Neutro Abs: 3.4 10*3/uL (ref 1.7–7.7)
PLATELETS: 220 10*3/uL (ref 150–400)
RBC: 3.57 MIL/uL — AB (ref 4.22–5.81)
RDW: 12.8 % (ref 11.5–15.5)
WBC: 5 10*3/uL (ref 4.0–10.5)

## 2014-03-16 LAB — ETHANOL: Alcohol, Ethyl (B): <11 mg/dL (ref 0–11)

## 2014-03-16 LAB — BASIC METABOLIC PANEL
ANION GAP: 10 (ref 5–15)
BUN: 19 mg/dL (ref 6–23)
CALCIUM: 9.9 mg/dL (ref 8.4–10.5)
CO2: 29 mEq/L (ref 19–32)
Chloride: 105 mEq/L (ref 96–112)
Creatinine, Ser: 0.77 mg/dL (ref 0.50–1.35)
Glucose, Bld: 108 mg/dL — ABNORMAL HIGH (ref 70–99)
POTASSIUM: 3.6 meq/L — AB (ref 3.7–5.3)
SODIUM: 144 meq/L (ref 137–147)

## 2014-03-16 MED ORDER — NICOTINE 21 MG/24HR TD PT24
21.0000 mg | MEDICATED_PATCH | Freq: Every day | TRANSDERMAL | Status: DC
  Administered 2014-03-16: 21 mg via TRANSDERMAL
  Filled 2014-03-16: qty 1

## 2014-03-16 MED ORDER — ARIPIPRAZOLE 5 MG PO TABS
5.0000 mg | ORAL_TABLET | Freq: Two times a day (BID) | ORAL | Status: DC
  Administered 2014-03-16: 5 mg via ORAL
  Filled 2014-03-16 (×3): qty 1

## 2014-03-16 MED ORDER — ONDANSETRON HCL 4 MG PO TABS: 4.0000 mg | ORAL_TABLET | Freq: Three times a day (TID) | ORAL | Status: DC | PRN

## 2014-03-16 MED ORDER — TRAZODONE HCL 100 MG PO TABS: 100.0000 mg | ORAL_TABLET | Freq: Every day | ORAL | Status: DC

## 2014-03-16 MED ORDER — CLONIDINE HCL 0.1 MG PO TABS
0.2000 mg | ORAL_TABLET | Freq: Two times a day (BID) | ORAL | Status: DC
  Administered 2014-03-16: 0.2 mg via ORAL
  Filled 2014-03-16: qty 2

## 2014-03-16 MED ORDER — ACETAMINOPHEN 325 MG PO TABS: 650.0000 mg | ORAL_TABLET | ORAL | Status: DC | PRN

## 2014-03-16 MED ORDER — LORAZEPAM 1 MG PO TABS
1.0000 mg | ORAL_TABLET | Freq: Three times a day (TID) | ORAL | Status: DC | PRN
  Administered 2014-03-16: 1 mg via ORAL
  Filled 2014-03-16: qty 1

## 2014-03-16 MED ORDER — LISINOPRIL 20 MG PO TABS
20.0000 mg | ORAL_TABLET | Freq: Every day | ORAL | Status: DC
  Administered 2014-03-16: 20 mg via ORAL
  Filled 2014-03-16 (×2): qty 1

## 2014-03-16 MED ORDER — TRIHEXYPHENIDYL HCL 5 MG PO TABS
5.0000 mg | ORAL_TABLET | Freq: Two times a day (BID) | ORAL | Status: DC
  Administered 2014-03-16: 5 mg via ORAL
  Filled 2014-03-16 (×3): qty 1

## 2014-03-16 MED ORDER — ALUM & MAG HYDROXIDE-SIMETH 200-200-20 MG/5ML PO SUSP: 30.0000 mL | ORAL | Status: DC | PRN

## 2014-03-16 MED ORDER — ZOLPIDEM TARTRATE 5 MG PO TABS: 5.0000 mg | ORAL_TABLET | Freq: Every evening | ORAL | Status: DC | PRN

## 2014-03-16 MED ORDER — GABAPENTIN 100 MG PO CAPS
200.0000 mg | ORAL_CAPSULE | Freq: Two times a day (BID) | ORAL | Status: DC
  Administered 2014-03-16: 200 mg via ORAL
  Filled 2014-03-16: qty 2

## 2014-03-16 MED ORDER — METFORMIN HCL 500 MG PO TABS
500.0000 mg | ORAL_TABLET | Freq: Two times a day (BID) | ORAL | Status: DC
  Administered 2014-03-16: 500 mg via ORAL
  Filled 2014-03-16 (×3): qty 1

## 2014-03-16 MED ORDER — IBUPROFEN 200 MG PO TABS: 600.0000 mg | ORAL_TABLET | Freq: Three times a day (TID) | ORAL | Status: DC | PRN

## 2014-03-16 MED ORDER — ACETAMINOPHEN 500 MG PO TABS
500.0000 mg | ORAL_TABLET | Freq: Four times a day (QID) | ORAL | Status: DC | PRN
  Administered 2014-03-16: 500 mg via ORAL
  Filled 2014-03-16: qty 1

## 2014-03-16 NOTE — ED Notes (Signed)
Pt arrives to the ER via EMS for bilateral foot pain; pt states that he has diabetic neuropathy and that he has been out of prison for a year; pt states that it has progressively gotten worse; pt states that he has been non-compliant with his medications and was using cocaine yesterday; CBG via EMS 133; pt with some redness to bilateral great toes; pt reports swelling to feet

## 2014-03-16 NOTE — [facility] Suicide Risk Assessment (Cosign Needed)
Suicide Risk Assessment  Discharge Assessment     Demographic Factors:  Male and Black  Total Time spent with patient: 30 minutes  Psychiatric Specialty Exam:     Blood pressure 135/74, pulse 72, temperature 98.3 F (36.8 C), temperature source Oral, resp. rate 14, SpO2 100.00%.There is no weight on file to calculate BMI.   General Appearance: Disheveled   Eye Contact:: Good   Speech: Clear and Coherent and Normal Rate   Volume: Normal   Mood: Irritable   Affect: Congruent   Thought Process: Circumstantial and Irrelevant   Orientation: Full (Time, Place, and Person)   Thought Content: Rumination   Suicidal Thoughts: No   Homicidal Thoughts: No   Memory: Immediate; Good  Recent; Good  Remote; Good   Judgement: Fair   Insight: Lacking and Shallow   Psychomotor Activity: Normal   Concentration: Fair   Recall: Good   Fund of Knowledge:Good   Language: Good   Akathisia: No   Handed: Right   AIMS (if indicated):   Assets: Communication Skills  Housing  Social Support   Sleep:   Musculoskeletal:  Strength & Muscle Tone: within normal limits  Gait & Station: normal  Patient leans: N/A      Mental Status Per Nursing Assessment::   On Admission:     Current Mental Status by Physician: Patient denies suicidal/homicidal ideation, psychosis, and paranoia  Loss Factors: NA  Historical Factors: NA  Risk Reduction Factors:   Positive social support  Continued Clinical Symptoms:  Alcohol/Substance Abuse/Dependencies  Cognitive Features That Contribute To Risk:  Closed-mindedness    Suicide Risk:  Minimal: No identifiable suicidal ideation.  Patients presenting with no risk factors but with morbid ruminations; may be classified as minimal risk based on the severity of the depressive symptoms  Discharge Diagnoses:  AXIS I: Substance Abuse and Substance Induced Mood Disorder  AXIS II: Deferred  AXIS III:  Past Medical History   Diagnosis  Date   .  Diabetes  mellitus without complication    .  Hypertension    .  Schizophrenia    .  CHF (congestive heart failure)    .  Neuropathy     AXIS IV: other psychosocial or environmental problems  AXIS V: 61-70 mild symptoms      Plan Of Care/Follow-up recommendations:  Activity:  as tolerated Diet:  as tolerated  Is patient on multiple antipsychotic therapies at discharge:  No   Has Patient had three or more failed trials of antipsychotic monotherapy by history:  No  Recommended Plan for Multiple Antipsychotic Therapies: NA    Rankin, Shuvon, FNP-BC 03/16/2014, 10:38 AM

## 2014-03-16 NOTE — ED Notes (Signed)
Pt is confused and talking rapidly about tactile sensations that do not exist. Pt is talking about portals in the room, snakes coming out of his mouth, and things biting him.

## 2014-03-16 NOTE — [facility] Assessment (Signed)
Per Dr. Taylor patient psychiatrically cleared and will be discharged.   

## 2014-03-16 NOTE — Discharge Instructions (Signed)
Chemical Dependency Chemical dependency is an addiction to drugs or alcohol. It is characterized by the repeated behavior of seeking out and using drugs and alcohol despite harmful consequences to the health and safety of ones self and others.  RISK FACTORS There are certain situations or behaviors that increase a person's risk for chemical dependency. These include:  A family history of chemical dependency.  A history of mental health issues, including depression and anxiety.  A home environment where drugs and alcohol are easily available to you.  Drug or alcohol use at a young age. SYMPTOMS  The following symptoms can indicate chemical dependency:  Inability to limit the use of drugs or alcohol.  Nausea, sweating, shakiness, and anxiety that occurs when alcohol or drugs are not being used.  An increase in amount of drugs or alcohol that is necessary to get drunk or high. People who experience these symptoms can assess their use of drugs and alcohol by asking themselves the following questions:  Have you been told by friends or family that they are worried about your use of alcohol or drugs?  Do friends and family ever tell you about things you did while drinking alcohol or using drugs that you do not remember?  Do you lie about using alcohol or drugs or about the amounts you use?  Do you have difficulty completing daily tasks unless you use alcohol or drugs?  Is the level of your work or school performance lower because of your drug or alcohol use?  Do you get sick from using drugs or alcohol but keep using anyway?  Do you feel uncomfortable in social situations unless you use alcohol or drugs?  Do you use drugs or alcohol to help forget problems? An answer of yes to any of these questions may indicate chemical dependency. Professional evaluation is suggested. Document Released: 06/11/2001 Document Revised: 09/09/2011 Document Reviewed: 08/23/2010 [facility] Patient  Information 2015 [place], [place]. This information is not intended to replace advice given to you by your health care provider. Make sure you discuss any questions you have with your health care provider.  Drug Abuse and Addiction in Sports There are many types of drugs that one may become addicted to including illegal drugs (marijuana, cocaine, amphetamines, hallucinogens, and narcotics), prescription drugs (hydrocodone, codeine, and alprazolam), and other chemicals such as alcohol or nicotine. Two types of addiction exist: physical and emotional. Physical addiction usually occurs after prolonged use of a drug. However, some drugs may only take a couple uses before addiction can occur. Physical addiction is marked by withdrawal symptoms in which the person experiences negative symptoms such as sweat, anxiety, tremors, hallucinations, or cravings in the absence of using the drug. Emotional dependence is the psychological desire for the "high" that the drugs produce when taken. SYMPTOMS   Inattentiveness.  Negligence.  Forgetfulness.  Insomnia.  Mood swings. RISK INCREASES WITH:   Family history of addiction.  Personal history of addictive personality. Studies have shown that risk takers, which many athletes are, have a higher risk of addiction. PREVENTION The only adequate prevention of drug abuse is abstinence from drugs. TREATMENT  The first step in quitting substance abuse is recognizing the problem and realizing that one has the power to change. Quitting requires a plan and support from others. It is often necessary to seek medical assistance. Caregivers are available to offer counseling, and for certain cases, medicine to diminish the physical symptoms of withdrawal. Many organizations exist such as Alcoholics Anonymous, Narcotics Anonymous, or the  [employer] on Alcoholism that offer support for individuals who have chosen to quit their habits. Document Released: 06/17/2005  Document Revised: 11/01/2013 Document Reviewed: 09/29/2008 [facility] Patient Information 2015 [place], [place]. This information is not intended to replace advice given to you by your health care provider. Make sure you discuss any questions you have with your health care provider.  Stimulant Use Disorder-Cocaine Cocaine is one of a group of powerful drugs called stimulants. Cocaine has medical uses for stopping nosebleeds and for pain control before minor nose or dental surgery. However, cocaine is misused because of the effects that it produces. These effects include:   A feeling of extreme pleasure.  Alertness.  High energy. Common street names for cocaine include coke, crack, blow, snow, and nose candy. Cocaine is snorted, dissolved in water and injected, or smoked.  Stimulants are addictive because they activate regions of the brain that produce both the pleasurable sensation of "reward" and psychological dependence. Together, these actions account for loss of control and the rapid development of drug dependence. This means you become ill without the drug (withdrawal) and need to keep using it to function.  Stimulant use disorder is use of stimulants that disrupts your daily life. It disrupts relationships with family and friends and how you do your job. Cocaine increases your blood pressure and heart rate. It can cause a heart attack or stroke. Cocaine can also cause death from irregular heart rate or seizures. SYMPTOMS Symptoms of stimulant use disorder with cocaine include:  Use of cocaine in larger amounts or over a longer period of time than intended.  Unsuccessful attempts to cut down or control cocaine use.  A lot of time spent obtaining, using, or recovering from the effects of cocaine.  A strong desire or urge to use cocaine (craving).  Continued use of cocaine in spite of major problems at work, school, or home because of use.  Continued use of cocaine in spite of relationship  problems because of use.  Giving up or cutting down on important life activities because of cocaine use.  Use of cocaine over and over in situations when it is physically hazardous, such as driving a car.  Continued use of cocaine in spite of a physical problem that is likely related to use. Physical problems can include:  Malnutrition.  Nosebleeds.  Chest pain.  High blood pressure.  A hole that develops between the part of your nose that separates your nostrils (perforated nasal septum).  Lung and kidney damage.  Continued use of cocaine in spite of a mental problem that is likely related to use. Mental problems can include:  Schizophrenia-like symptoms.  Depression.  Bipolar mood swings.  Anxiety.  Sleep problems.  Need to use more and more cocaine to get the same effect, or lessened effect over time with use of the same amount of cocaine (tolerance).  Having withdrawal symptoms when cocaine use is stopped, or using cocaine to reduce or avoid withdrawal symptoms. Withdrawal symptoms include:  Depressed or irritable mood.  Low energy or restlessness.  Bad dreams.  Poor or excessive sleep.  Increased appetite. DIAGNOSIS Stimulant use disorder is diagnosed by your health care provider. You may be asked questions about your cocaine use and how it affects your life. A physical exam may be done. A drug screen may be ordered. You may be referred to a mental health professional. The diagnosis of stimulant use disorder requires at least two symptoms within 12 months. The type of stimulant use disorder  depends on the number of signs and symptoms you have. The type may be:  Mild. Two or three signs and symptoms.  Moderate. Four or five signs and symptoms.  Severe. Six or more signs and symptoms. TREATMENT Treatment for stimulant use disorder is usually provided by mental health professionals with training in substance use disorders. The following options are  available:  Counseling or talk therapy. Talk therapy addresses the reasons you use cocaine and ways to keep you from using again. Goals of talk therapy include:  Identifying and avoiding triggers for use.  Handling cravings.  Replacing use with healthy activities.  Support groups. Support groups provide emotional support, advice, and guidance.  Medicine. Certain medicines may decrease cocaine cravings or withdrawal symptoms. HOME CARE INSTRUCTIONS  Take medicines only as directed by your health care provider.  Identify the people and activities that trigger your cocaine use and avoid them.  Keep all follow-up visits as directed by your health care provider. SEEK MEDICAL CARE IF:  Your symptoms get worse or you relapse.  You are not able to take medicines as directed. SEEK IMMEDIATE MEDICAL CARE IF:  You have serious thoughts about hurting yourself or others.  You have a seizure, chest pain, sudden weakness, or loss of speech or vision. FOR MORE INFORMATION  National Institute on Drug Abuse: http://www.price-smith.com/  Substance Abuse and Mental Health Services Administration: [employer].pt Document Released: 06/14/2000 Document Revised: 11/01/2013 Document Reviewed: 06/30/2013 [facility] Patient Information 2015 [place], [place]. This information is not intended to replace advice given to you by your health care provider. Make sure you discuss any questions you have with your health care provider.

## 2014-03-16 NOTE — ED Notes (Signed)
Pt. Was given a bassen to soak his feet in.
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
## 2014-03-16 NOTE — ED Provider Notes (Signed)
CSN: [id number]     Arrival date & time 03/16/14  4098 History   First MD Initiated Contact with Patient 03/16/14 0559     Chief Complaint  Patient presents with  . Foot Pain     (Consider location/radiation/quality/duration/timing/severity/associated sxs/prior Treatment) HPI Pt is a 53yo male with hx of DM, HTN, schizophrenia, CHF, and diabetic neuropathy presenting to ED with c/o bilateral foot pain, however, during exam, pt became tearful and at times difficult to understand due to mumbling and crying.  Pt mentioned wanting to call his cousins as he has no place to stay, states [facility]  Pt states he no longer wants to live in society but does not have any specific plans of hurting himself.  Pt states his feet have been hurting "for a while"  Per triage note, pt has been out of prior for a year and has not been compliant with his diabetes medication.  His neuropathy has progressively worsened.  Pt does admit to using cocaine yesterday.  Per EMS, [facility]G-133.  History is limited from pt due to being nonsensical at times.   Past Medical History  Diagnosis Date  . Diabetes mellitus without complication   . Hypertension   . Schizophrenia   . CHF (congestive heart failure)   . Neuropathy    History reviewed. No pertinent past surgical history. Family History  Problem Relation Age of Onset  . Hypertension Other   . Diabetes Other    History  Substance Use Topics  . Smoking status: Current Every Day Smoker -- 1.00 packs/day    Types: Cigarettes  . Smokeless tobacco: Current User  . Alcohol Use: Yes     Comment: wine     Review of Systems  Constitutional: Negative for fever and chills.  Respiratory: Negative for cough and shortness of breath.   Cardiovascular: Negative for chest pain and palpitations.  Gastrointestinal: Negative for nausea, vomiting, abdominal pain and diarrhea.  Musculoskeletal: Positive for arthralgias and myalgias. Negative for back  pain.  Skin: Positive for wound (blister on right fifth toe). Negative for color change.  Psychiatric/Behavioral: Positive for suicidal ideas and dysphoric mood. Negative for self-injury. The patient is nervous/anxious.   All other systems reviewed and are negative.     Allergies  Haldol  Home Medications   Prior to Admission medications   Medication Sig Start Date End Date Taking? Authorizing Provider  acetaminophen (TYLENOL) 500 MG tablet Take 500 mg by mouth every 6 (six) hours as needed for mild pain.    Historical Provider, MD  ARIPiprazole (ABILIFY) 5 MG tablet Take 1 tablet (5 mg total) by mouth 2 (two) times daily after a meal. 03/07/14   Nanine Means, NP  cloNIDine (CATAPRES) 0.2 MG tablet Take 1 tablet (0.2 mg total) by mouth 2 (two) times daily. 03/07/14   Nanine Means, NP  gabapentin (NEURONTIN) 100 MG capsule Take 2 capsules (200 mg total) by mouth 2 (two) times daily. 03/07/14   Nanine Means, NP  lisinopril (PRINIVIL,ZESTRIL) 20 MG tablet Take 1 tablet (20 mg total) by mouth daily. For high blood pressure. 03/07/14   Nanine Means, NP  metFORMIN (GLUCOPHAGE) 500 MG tablet Take 1 tablet (500 mg total) by mouth 2 (two) times daily with a meal. 03/07/14   Nanine Means, NP  traZODone (DESYREL) 100 MG tablet Take 1 tablet (100 mg total) by mouth at bedtime. 03/07/14   Nanine Means, NP  trihexyphenidyl (ARTANE) 5 MG tablet Take 1 tablet (  5 mg total) by mouth 2 (two) times daily with a meal. 03/07/14   Nanine Means, NP   BP 135/74  Pulse 72  Temp(Src) 98.3 F (36.8 C) (Oral)  Resp 14  SpO2 100% Physical Exam  Nursing note and vitals reviewed. Constitutional: He is oriented to person, place, and time. He appears well-developed and well-nourished.  HENT:  Head: Normocephalic and atraumatic.  Eyes: EOM are normal.  Neck: Normal range of motion.  Cardiovascular: Normal rate.   Pulmonary/Chest: Effort normal.  Musculoskeletal: Normal range of motion.  Mild edema to bilateral feet with  tenderness. FROM ankles and toes. Unroofed blister on right 5th toe, no discharge.   Neurological: He is alert and oriented to person, place, and time.  Skin: Skin is warm and dry.  Bilateral feet: thickened skin to toes with unroofed blister to right 5th toe, no ecchymosis. No evidence of underlying infection.   Psychiatric: His affect is labile. His speech is rapid and/or pressured and tangential. He is agitated. He is not actively hallucinating. He exhibits a depressed mood. He expresses suicidal ("i don't want to live in society any more") ideation. He expresses no homicidal ideation. He expresses no suicidal plans and no homicidal plans.    ED Course  Procedures (including critical care time) Labs Review Labs Reviewed  CBC WITH DIFFERENTIAL - Abnormal; Notable for the following:    RBC 3.57 (*)    Hemoglobin 10.3 (*)    HCT 31.9 (*)    All other components within normal limits  BASIC METABOLIC PANEL - Abnormal; Notable for the following:    Potassium 3.6 (*)    Glucose, Bld 108 (*)    All other components within normal limits  ETHANOL  URINE RAPID DRUG SCREEN (HOSP PERFORMED)    Imaging Review No results found.   EKG Interpretation None      MDM   Final diagnoses:  Bilateral foot pain  Diabetic neuropathy, painful    Pt presenting to ED initially for diabetic neuropathy foot pain, however, during exam, pt became tearful and tangential in his speech.  Mentions he no longer wants to live in society. Pt seeming to beg for help at times. Pt was seen by Behavioral health on 03/12/14, however, this PA does not feel pt is stable enough to be discharged home w/o another psychiatric evaluation.  Psych hold orders placed including consult to TTS.  Pt is medically clear pending labs.    Bilateral feet appear dried and mildly edematous but no evidence of underlying infection.  Pain most likely due to neuropathy. Home medications ordered including gabapentin and acetaminophen.     10:49 AM Pt has been assessed by behavioral health and is safe for discharge home.    Junius Finner, PA-C 03/16/14 1058

## 2014-03-16 NOTE — Progress Notes (Signed)
  CARE MANAGEMENT ED NOTE 03/16/2014  Patient:  [name], [name]   Account Number:  [id number]  Date Initiated:  03/16/2014  Documentation initiated by:  [name]  Subjective/Objective Assessment:   53 yr old medicare [name] pt c/o bilateral foot pain; diabetic neuropathy & out of prison for a year; states foot pain progressively gotten worse; med noncompliance; cocaine use dx Substance Abuse & Substance Induced Mood Disorder     Subjective/Objective Assessment Detail:   No pcp listed Pt confirms he does not have a pcp when Cm inquired  cousin [name] in [place] at this time Assists him with his money but is not his payee Pt confirms he is his own payee  Pt told Cm he "like crack"  Pt agreed to go to a pcp appointment if CM assist with an appointment as part of plan of care to managed chronic illnesses  Cm observed pt leaving [name] a voice message for [name] inquiring about his money "what is going on? why you won't do me right?'  Pt tells cm that everywhere he goes "[place] police are there" Reports he sees police in [name] he goes to, in his last 2 group homes ([place] in [name] on [name] [address] [name])  Reports not having transportation to get around This is not a medicaid pt but a medicaid potential pt as listed in EPIC therefore no access to [name] transportation  [employer] NP/PA reports speaking with [name], cousin via telephone for collateral information     Action/Plan:   [phone] ED CM noted pt with pt about 13 [facility] ED visits Inquired what CM may do to assist him. Assisted him with detailed directions to call his cousin at [phone] from his ED room phone Provided new medicare pcp appt & transportation resources   Action/Plan Detail:   faxed clinicals to Dr [name] at [phone] with fax confirmation received at 1154   Anticipated DC Date:  03/16/2014     Status Recommendation to Physician:   Result of Recommendation:     Other ED Services  Consult Working Plan    DC Planning Services  Other  PCP issues  Outpatient Services - Pt will follow up    Choice offered to / List presented to:            Status of service:  Completed, signed off  ED Comments:   ED Comments Detail:  [place], MD On 04/11/2014 Your appointment is at 1130 with Dr [name] on April 11 2014 Please go to this appointment or call to reschedule Voice message for [name] about this appointment has been left for her at [phone]  [address] [place] [address] [phone]  03/16/2014 1142 Pt noted to be snoring in bed in room 6 and did not respond to his name being called x 3. CM porovided ED RN with a sheet lkisting pt f/u appointment information and written information for [facility] and 12 to go transportation services to be given to pt at d/c 1110 Cm called and left a voice message for [place] pt's cousin, [name] at [phone] about above appointment scheduled for [place] pt 1100 Spoke with [name] at [name] [phone] to make pt [place] above new medicare appointment for f/u on bilateral foot pain, neuropathy

## 2014-03-16 NOTE — Consult Note (Signed)
Face to face evaluation and I agree with this note 

## 2014-03-17 NOTE — ED Provider Notes (Signed)
Medical screening examination/treatment/procedure(s) were performed by non-physician practitioner and as supervising physician I was immediately available for consultation/collaboration.   EKG Interpretation None        Richardean Canal, MD 03/17/14 2129

## 2014-03-22 ENCOUNTER — Encounter (HOSPITAL_COMMUNITY): Payer: Self-pay | Admitting: Emergency Medicine

## 2014-03-22 DIAGNOSIS — Z79899 Other long term (current) drug therapy: Secondary | ICD-10-CM | POA: Insufficient documentation

## 2014-03-22 DIAGNOSIS — R32 Unspecified urinary incontinence: Secondary | ICD-10-CM | POA: Insufficient documentation

## 2014-03-22 DIAGNOSIS — I509 Heart failure, unspecified: Secondary | ICD-10-CM | POA: Insufficient documentation

## 2014-03-22 DIAGNOSIS — G589 Mononeuropathy, unspecified: Secondary | ICD-10-CM | POA: Insufficient documentation

## 2014-03-22 DIAGNOSIS — N342 Other urethritis: Secondary | ICD-10-CM | POA: Insufficient documentation

## 2014-03-22 DIAGNOSIS — I10 Essential (primary) hypertension: Secondary | ICD-10-CM | POA: Insufficient documentation

## 2014-03-22 DIAGNOSIS — F209 Schizophrenia, unspecified: Secondary | ICD-10-CM | POA: Insufficient documentation

## 2014-03-22 DIAGNOSIS — E119 Type 2 diabetes mellitus without complications: Secondary | ICD-10-CM | POA: Insufficient documentation

## 2014-03-22 DIAGNOSIS — F17200 Nicotine dependence, unspecified, uncomplicated: Secondary | ICD-10-CM | POA: Insufficient documentation

## 2014-03-22 NOTE — ED Notes (Addendum)
Pt. [place] with EMS [place] a [facility] [place] ( homeless) reports urinary and bowel incontinence this evening after smoking crack Cocaine . Pt. also reported pain at bilateral groin onset this evening . Denies injury or fall . Ambulatory .

## 2014-03-23 ENCOUNTER — Emergency Department (HOSPITAL_COMMUNITY)
Admission: EM | Admit: 2014-03-23 | Discharge: 2014-03-24 | Disposition: A | Discharge status: Home / Self Care | Payer: Medicare Other | Attending: Emergency Medicine | Admitting: Emergency Medicine

## 2014-03-23 ENCOUNTER — Emergency Department (HOSPITAL_COMMUNITY): Payer: Medicare Other

## 2014-03-23 ENCOUNTER — Emergency Department (HOSPITAL_COMMUNITY)
Admission: EM | Admit: 2014-03-23 | Discharge: 2014-03-23 | Disposition: A | Discharge status: Home / Self Care | Payer: Self-pay | Attending: Emergency Medicine | Admitting: Emergency Medicine

## 2014-03-23 ENCOUNTER — Encounter (HOSPITAL_COMMUNITY): Payer: Self-pay | Admitting: Emergency Medicine

## 2014-03-23 DIAGNOSIS — Z760 Encounter for issue of repeat prescription: Secondary | ICD-10-CM | POA: Insufficient documentation

## 2014-03-23 DIAGNOSIS — F209 Schizophrenia, unspecified: Secondary | ICD-10-CM | POA: Insufficient documentation

## 2014-03-23 DIAGNOSIS — I509 Heart failure, unspecified: Secondary | ICD-10-CM | POA: Insufficient documentation

## 2014-03-23 DIAGNOSIS — N342 Other urethritis: Secondary | ICD-10-CM

## 2014-03-23 DIAGNOSIS — Z79899 Other long term (current) drug therapy: Secondary | ICD-10-CM | POA: Insufficient documentation

## 2014-03-23 DIAGNOSIS — N489 Disorder of penis, unspecified: Secondary | ICD-10-CM | POA: Insufficient documentation

## 2014-03-23 DIAGNOSIS — F411 Generalized anxiety disorder: Secondary | ICD-10-CM | POA: Insufficient documentation

## 2014-03-23 DIAGNOSIS — E119 Type 2 diabetes mellitus without complications: Secondary | ICD-10-CM | POA: Insufficient documentation

## 2014-03-23 DIAGNOSIS — I10 Essential (primary) hypertension: Secondary | ICD-10-CM | POA: Insufficient documentation

## 2014-03-23 DIAGNOSIS — R300 Dysuria: Secondary | ICD-10-CM | POA: Insufficient documentation

## 2014-03-23 DIAGNOSIS — G589 Mononeuropathy, unspecified: Secondary | ICD-10-CM | POA: Insufficient documentation

## 2014-03-23 DIAGNOSIS — F17200 Nicotine dependence, unspecified, uncomplicated: Secondary | ICD-10-CM | POA: Insufficient documentation

## 2014-03-23 DIAGNOSIS — R350 Frequency of micturition: Secondary | ICD-10-CM | POA: Insufficient documentation

## 2014-03-23 LAB — URINALYSIS, ROUTINE W REFLEX MICROSCOPIC
Bilirubin Urine: NEGATIVE
GLUCOSE, UA: NEGATIVE mg/dL
HGB URINE DIPSTICK: NEGATIVE
Ketones, ur: NEGATIVE mg/dL
Leukocytes, UA: NEGATIVE
Nitrite: NEGATIVE
PH: 7 (ref 5.0–8.0)
Protein, ur: NEGATIVE mg/dL
SPECIFIC GRAVITY, URINE: 1.013 (ref 1.005–1.030)
Urobilinogen, UA: 1 mg/dL (ref 0.0–1.0)

## 2014-03-23 MED ORDER — LEVOFLOXACIN 750 MG PO TABS: 750.0000 mg | ORAL_TABLET | Freq: Every day | ORAL | Status: DC

## 2014-03-23 MED ORDER — IBUPROFEN 200 MG PO TABS
400.0000 mg | ORAL_TABLET | Freq: Once | ORAL | Status: AC
  Administered 2014-03-23: 400 mg via ORAL
  Filled 2014-03-23: qty 2

## 2014-03-23 MED ORDER — LEVOFLOXACIN 750 MG PO TABS
750.0000 mg | ORAL_TABLET | Freq: Once | ORAL | Status: AC
  Administered 2014-03-23: 750 mg via ORAL
  Filled 2014-03-23: qty 1

## 2014-03-23 NOTE — ED Notes (Signed)
Left door open per patient request. Patient beginning to become anxious.

## 2014-03-23 NOTE — Discharge Instructions (Signed)
°Emergency Department Resource Guide °1) Find a Doctor and Pay Out of Pocket °Although you won't have to find out who is covered by your insurance plan, it is a good idea to ask around and get recommendations. You will then need to call the office and see if the doctor you have chosen will accept you as a new patient and what types of options they offer for patients who are self-pay. Some doctors offer discounts or will set up payment plans for their patients who do not have insurance, but you will need to ask so you aren't surprised when you get to your appointment. ° °2) Contact Your Local Health Department °Not all health departments have doctors that can see patients for sick visits, but many do, so it is worth a call to see if yours does. If you don't know where your local health department is, you can check in your phone book. The CDC also has a tool to help you locate your state's health department, and many state websites also have listings of all of their local health departments. ° °3) Find a Walk-in Clinic °If your illness is not likely to be very severe or complicated, you may want to try a walk in clinic. These are popping up all over the country in pharmacies, drugstores, and shopping centers. They're usually staffed by nurse practitioners or physician assistants that have been trained to treat common illnesses and complaints. They're usually fairly quick and inexpensive. However, if you have serious medical issues or chronic medical problems, these are probably not your best option. ° °No Primary Care Doctor: °- Call [facility] at  832-8000 - they can help you locate a primary care doctor that  accepts your insurance, provides certain services, etc. °- Physician Referral Service- 1-800-533-3463 ° °Chronic Pain Problems: °Organization         Address  Phone   Notes  °[facility]  (336) 297-2271 Patients need to be referred by their primary care doctor.  ° °Medication  Assistance: °Organization         Address  Phone   Notes  °[place] [facility] [address] °[place], [place] [address] (336) 641-8030 --Must be a resident of [place] °-- Must have NO insurance coverage whatsoever (no Medicaid/ Medicare, etc.) °-- The pt. MUST have a primary care doctor that directs their care regularly and follows them in the community °  °[facility]  (866) 331-1348   °[employer]  (888) 892-1162   ° °Agencies that provide inexpensive medical care: °Organization         Address  Phone   Notes  °[facility]  (336) 832-8035   °[facility]    (336) 832-7272   °[facility] [address] °[place], [place] [address] (336) 832-4777   °[facility] [address], °[place] (336) 271-4999   °[facility]    (336) 373-0678   °[facility]    (336) 272-1050   °[facility] ° [address], [place] Phone:  (336) 832-4444, Fax:  (336) 832-4440 Hours of Operation:  9 am - 6 pm, M-F.  Also accepts Medicaid/Medicare and self-pay.  °[facility] ° [address], [place] Phone: (336) 832-3150, Fax: (336) 832-3151. Hours of Operation:  8:30 am - 5:30 pm, M-F.  Also accepts Medicaid and self-pay.  °[facility] [place] [address], [place] Phone: (336) 878-6027   °[facility] [address], [place], [place] (336)723-1848, Ext. 123 Mondays & Thursdays: 7-9 AM.  First 15 patients are seen on a first come, first serve basis. °  ° °Medicaid-accepting [place] Providers: ° °Organization         Address  Phone   Notes  °[facility] [address], [place] (336) 641-2100 Also accepts self-pay patients.  °[facility] [address], [place] ° (336) 856-9996   °[facility] [address], [place]  (336) 288-8857   °[facility] [address] [place] [address], [place] (336) 299-7000   °Veita Bland [address], [place]  ° (336) 373-1557 Only accepts [place] Access Medicaid patients after they have their name applied to their card.  ° °Self-Pay (no insurance) in [place]: ° °Organization         Address  Phone   Notes  °Sickle Cell Patients, [facility] [address], [place] (336) 832-1970   °[facility] [address], [place] (336) 832-4400   °[facility] ° [address], [place] (336) 992-4800   °[facility]/Dr. Osei-Bonsu ° [address] [place] [address], [place] or [address], [place] (336) 841-8500 Phone number for both [place] and [place] locations is the same.  °[facility] [address], [place] (336) 299-0000   °[facility] [address] [place] [address], [place] or [address] (336) 852-7530 °(336) 878-2260   °[facility] [address], [place] (336) 350-1642, phone; (336) 294-5005, fax Sees patients 1st and 3rd Saturday of every month.  Must not qualify for public or private insurance (i.e. Medicaid, Medicare, [place] Health Choice, Veterans' Benefits) • Household income should be no more than 200% of the poverty level •The clinic cannot treat you if you are pregnant or think you are pregnant • Sexually transmitted diseases are not treated at the clinic.  ° ° °Dental Care: °Organization         Address  Phone  Notes  °[place] [facility] [address], [place] (336) 641-6152 Accepts children up to age 21 who are enrolled in Medicaid or [place] Health Choice; pregnant women with a Medicaid card; and children who have applied for Medicaid or [place] Health Choice, but were declined, whose parents can pay a reduced fee at time of service.  °[place]  [facility] [place]  [address], [place] (336) 641-7733 Accepts children up to age 21 who are enrolled in Medicaid or [place] Health Choice; pregnant women with a Medicaid card; and children who have applied for Medicaid or [place] Health Choice, but were declined, whose parents can pay a reduced fee at time of service.  °[facility] ° [address], [place] (336) 641-4533 Patients are seen by appointment only. Walk-ins are not accepted. [facility] will see patients 18 years of age and older. °Monday - Tuesday (8am-5pm) °Most Wednesdays (8:30-5pm) °$30 per visit, cash only  °[facility] ° [address], [place] (336) 641-4533 Patients are seen by appointment only. Walk-ins are not accepted. [facility] will see patients 18 years of age and older. °One   Wednesday Evening (Monthly: Volunteer Based).  $30 per visit, cash only  °[facility]  (919) 537-3737 for adults; Children under age 4, call Graduate Pediatric Dentistry at ([phone]) [phone]. Children aged 4-14, please call (919) [phone] to request a pediatric application. ° Dental services are provided in all areas of dental care including fillings, crowns and bridges, complete and partial dentures, implants, gum treatment, root canals, and extractions. Preventive care is also provided. Treatment is provided to both adults and children. °Patients are selected via a lottery and there is often a waiting list. °  °[facility] [address], °[place] ° (336) 763-8833 [email or web address] °  °[facility] [address], [place], [place] (336)723-1848, Ext. 123 Second and Fourth Thursday of each month, opens at 6:30 AM; Clinic ends at 9 AM.  Patients are seen on a first-come first-served basis, and a limited number are seen during each clinic.  ° °[facility] ° [address], [place], [place] (336) 723-7904    Eligibility Requirements °You must have lived in [place], [place], or [place] counties for at least the last three months. °  You cannot be eligible for state or federal sponsored healthcare insurance, including Veterans Administration, Medicaid, or Medicare. °  You generally cannot be eligible for healthcare insurance through your employer.  °  How to apply: °Eligibility screenings are held every Tuesday and Wednesday afternoon from 1:00 pm until 4:00 pm. You do not need an appointment for the interview!  °[facility] [address], [place], [place] 336-631-2330   °[facility]  336-342-8273   °[facility]  336-703-3100   °[facility]  336-570-6415   ° °Behavioral Health Resources in the Community: °Intensive Outpatient Programs °Organization         Address  Phone  Notes  °[facility] [address], [place], [place] 336-878-6098   °[facility] [address], [place], [place] 336-832-9800   °[facility] [address], [place], [place] ° 336-882-2125   °[facility] [address],  °[place], [place] 1-800-853-5163 or 336-641-4981   °Substance Abuse Resources °Organization         Address  Phone  Notes  °[facility]  336-882-2125   °[facility]  336-784-9470   °[facility]  336-285-9073   °[facility]  336-845-3988   °Residential & Outpatient Substance Abuse Program  1-800-659-3381   °Psychological Services °Organization         Address  Phone  Notes  °[facility]  336- 832-9600   °[facility]  336- 378-7881   °[facility] [address], [place] 1-800-853-5163 or 336-641-4981   ° °Mobile Crisis Teams °Organization         Address  Phone  Notes  °[facility], Mobile Crisis Care Unit  1-877-626-1772   °Assertive °[facility] ° [address]  [place], [place] 336-834-9664   °Sharon DeEsch [address] °[place] 336-554-5454   ° °Self-Help/Support Groups °Organization         Address  Phone             Notes  °[facility]  - variety of support groups  336- 373-1402 Call for more information  °Narcotics Anonymous (NA), [facility] [address], °[place]  2 meetings at this location  ° °  Residential Treatment Programs Organization         Address  Phone  Notes  [facility] [address],    [place]  1-610-960-4540   [facility]  [address], [place] [address], [place], [place] 478-295-6213   [facility] [address] [place], [place] 086-578-4696 Admissions: 8am-3pm M-F  [facility] [address]    [place], [place] 295-284-1324   [facility] [address] [place], [place], [place] 401-027-2536   [facility] [address]  [place], [place] 644-034-7425   [facility] - Intensive Outpatient [address], [place], [place] 956-387-5643   [facility] ([facility] [address] [place]  [place], [place] 3-295-188-4166 or (573) 006-9904   [facility] [address], [place], [place] 323-557-3220 Accepts Medicaid  [facility] [address],  [place] 2-542-706-2376 Substance Abuse/Addiction Treatment   [facility] Organization         Address  Phone  Notes  [facility]  (343) 436-0427   Angie Fava, PhD [address] [place], [place]   515-443-2020 or 817-277-1873   [facility]   [address] [place], [place] (787) 166-6398   [facility] [address], [place], [place] 519-301-0560 Insurance/Medicaid/sponsorship through [facility] [address]                                    [place], [place] [phone] Therapy/tele-psych/case    [facility] [address], [place] 408-673-0486    Dr. Lolly Mustache  (719)805-3188   [facility]  [facility] Dept. 1) [address], [place] 2) [address] [place] 3)  [address], [place] [phone]  (657)321-8193   [facility] Child Abuse Hotline (515)084-6242 or (208)283-7132 (After Hours)      Orchitis Mr. Swiger, you were seen today for pain in your penis, scrotum and rectum.  There was no foreign body or objects in your rectum.  Your symptoms are consistent with an infection of the scrotum and epidiymis.  Take antibiotics as directed and follow up with a regular doctor within 3 days.  If any symptoms worsen, come back to the ED for repeat evaluation.  Thank you. Orchitis is an infection of the testicle of usually sudden onset (happens quickly). It may be viral or bacterial (caused by germs). Usually with this illness there is generalized malaise (not feeling well) and fever. There is also pain. There is usually tenderness and swelling of the scrotum and testicle. DIAGNOSIS  Your caregiver will perform an exam to make sure there is not another reason for the pain in your testicle. A rectal exam may be done to find out if the prostate is swollen and tender. Blood work may be done to see if your white blood cell count is elevated. This can help determine if an infection is viral or bacterial. A urinalysis can also determine what type of infection is present. Most bacterial infections can be treated with antibiotics (medications which kill germs). LET YOUR CAREGIVER KNOW ABOUT:  Allergies.  Medications taken including herbs, eye drops, over the counter medications, and creams.  Use of steroids (by  mouth or creams).  Previous problems with anesthetics or novocaine.  Previous prostate infections.  History of blood clots (thrombophlebitis).  History of bleeding or blood  problems.  Previous surgery.  Previous urinary tract infection.  Other health problems. HOME CARE INSTRUCTIONS   Apply cold packs to the scrotal area for twenty minutes, four times per day or as needed.  A scrotal support may be helpful. Keep a small pillow or support under your testicles while lying or sitting down.  Only take over-the-counter or prescription medicines for pain, discomfort, or fever as directed by your caregiver.  Take all medications, including antibiotics, as directed. Take the antibiotics for the full prescribed length of time even if you are feeling better. SEEK IMMEDIATE MEDICAL CARE IF:   Your redness, swelling, or pain in the testicle increases or is not getting better.  You have a fever.  You have pain not relieved with medicines.  You have any worsening of any symptoms (problems) that originally brought you in for medical care. Document Released: 06/14/2000 Document Revised: 09/09/2011 Document Reviewed: 06/17/2005 [facility] Patient Information 2015 [place], [place]. This information is not intended to replace advice given to you by your health care provider. Make sure you discuss any questions you have with your health care provider. Epididymitis Epididymitis is a swelling (inflammation) of the epididymis. The epididymis is a cord-like structure along the back part of the testicle. Epididymitis is usually, but not always, caused by infection. This is usually a sudden problem beginning with chills, fever and pain behind the scrotum and in the testicle. There may be swelling and redness of the testicle. DIAGNOSIS  Physical examination will reveal a tender, swollen epididymis. Sometimes, cultures are obtained from the urine or from prostate secretions to help find out if there is an infection or if the cause is a different problem. Sometimes, blood work is performed to see if your white blood cell count is elevated and if a germ (bacterial) or viral infection is  present. Using this knowledge, an appropriate medicine which kills germs (antibiotic) can be chosen by your caregiver. A viral infection causing epididymitis will most often go away (resolve) without treatment. HOME CARE INSTRUCTIONS   Hot sitz baths for 20 minutes, 4 times per day, may help relieve pain.  Only take over-the-counter or prescription medicines for pain, discomfort or fever as directed by your caregiver.  Take all medicines, including antibiotics, as directed. Take the antibiotics for the full prescribed length of time even if you are feeling better.  It is very important to keep all follow-up appointments. SEEK IMMEDIATE MEDICAL CARE IF:   You have a fever.  You have pain not relieved with medicines.  You have any worsening of your problems.  Your pain seems to come and go.  You develop pain, redness, and swelling in the scrotum and surrounding areas. MAKE SURE YOU:   Understand these instructions.  Will watch your condition.  Will get help right away if you are not doing well or get worse. Document Released: 06/14/2000 Document Revised: 09/09/2011 Document Reviewed: 05/04/2009 [facility] Patient Information 2015 [place], [place]. This information is not intended to replace advice given to you by your health care provider. Make sure you discuss any questions you have with your health care provider.

## 2014-03-23 NOTE — ED Notes (Addendum)
PT reports painful urination. Pt reports he is upset that it is so painful. States it is forcing him to urinate. 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 Was seen here yesterday for same issue and discharged.
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
## 2014-03-24 LAB — GC/CHLAMYDIA PROBE AMP
CT Probe RNA: NEGATIVE
GC PROBE AMP APTIMA: NEGATIVE

## 2014-03-24 MED ORDER — PHENAZOPYRIDINE HCL 200 MG PO TABS: 200.0000 mg | ORAL_TABLET | Freq: Three times a day (TID) | ORAL | Status: DC

## 2014-03-24 MED ORDER — ACETAMINOPHEN 325 MG PO TABS
650.0000 mg | ORAL_TABLET | Freq: Once | ORAL | Status: AC
  Administered 2014-03-24: 650 mg via ORAL
  Filled 2014-03-24: qty 2

## 2014-03-24 MED ORDER — LEVOFLOXACIN 750 MG PO TABS: 750.0000 mg | ORAL_TABLET | Freq: Every day | ORAL | Status: DC

## 2014-03-24 MED ORDER — ACETAMINOPHEN 500 MG PO TABS: 500.0000 mg | ORAL_TABLET | Freq: Four times a day (QID) | ORAL | Status: DC | PRN

## 2014-03-24 NOTE — Discharge Instructions (Signed)
Dysuria Dysuria is the medical term for pain with urination. There are many causes for dysuria, but urinary tract infection is the most common. If a urinalysis was performed it can show that there is a urinary tract infection. A urine culture confirms that you or your child is sick. You will need to follow up with a healthcare provider because:  If a urine culture was done you will need to know the culture results and treatment recommendations.  If the urine culture was positive, you or your child will need to be put on antibiotics or know if the antibiotics prescribed are the right antibiotics for your urinary tract infection.  If the urine culture is negative (no urinary tract infection), then other causes may need to be explored or antibiotics need to be stopped. Today laboratory work may have been done and there does not seem to be an infection. If cultures were done they will take at least 24 to 48 hours to be completed. Today x-rays may have been taken and they read as normal. No cause can be found for the problems. The x-rays may be re-read by a radiologist and you will be contacted if additional findings are made. You or your child may have been put on medications to help with this problem until you can see your primary caregiver. If the problems get better, see your primary caregiver if the problems return. If you were given antibiotics (medications which kill germs), take all of the mediations as directed for the full course of treatment.  If laboratory work was done, you need to find the results. Leave a telephone number where you can be reached. If this is not possible, make sure you find out how you are to get test results. HOME CARE INSTRUCTIONS   Drink lots of fluids. For adults, drink eight, 8 ounce glasses of clear juice or water a day. For children, replace fluids as suggested by your caregiver.  Empty the bladder often. Avoid holding urine for long periods of time.  After a bowel  movement, women should cleanse front to back, using each tissue only once.  Empty your bladder before and after sexual intercourse.  Take all the medicine given to you until it is gone. You may feel better in a few days, but TAKE ALL MEDICINE.  Avoid caffeine, tea, alcohol and carbonated beverages, because they tend to irritate the bladder.  In men, alcohol may irritate the prostate.  Only take over-the-counter or prescription medicines for pain, discomfort, or fever as directed by your caregiver.  If your caregiver has given you a follow-up appointment, it is very important to keep that appointment. Not keeping the appointment could result in a chronic or permanent injury, pain, and disability. If there is any problem keeping the appointment, you must call back to this facility for assistance. SEEK IMMEDIATE MEDICAL CARE IF:   Back pain develops.  A fever develops.  There is nausea (feeling sick to your stomach) or vomiting (throwing up).  Problems are no better with medications or are getting worse. MAKE SURE YOU:   Understand these instructions.  Will watch your condition.  Will get help right away if you are not doing well or get worse. Document Released: 03/15/2004 Document Revised: 09/09/2011 Document Reviewed: 01/21/2008 [employer] Patient Information 2015 [employer]. This information is not intended to replace advice given to you by your health care provider. Make sure you discuss any questions you have with your health care provider.  

## 2014-03-24 NOTE — ED Provider Notes (Signed)
Medical screening examination/treatment/procedure(s) were performed by non-physician practitioner and as supervising physician I was immediately available for consultation/collaboration.  Ariauna Farabee, MD 03/24/14 2004 

## 2014-03-24 NOTE — ED Provider Notes (Signed)
CSN: [id number]     Arrival date & time 03/23/14  2150 History   First MD Initiated Contact with Patient 03/23/14 2338     Chief Complaint  Patient presents with  . Penis Pain     (Consider location/radiation/quality/duration/timing/severity/associated sxs/prior Treatment) HPI Pt is a 53yo male with hx of diabetes, HTN, schizophrenia, CHF, and neuropathy presenting to ED with request for medication refill of his antibiotics he was prescribed earlier this morning when pt was seen for dysuria.  Pt states he mis-placed his antibiotic prescription, would like a prescription for acetaminophen and a bus pass. Pt states he still has penis pain but denies fever, n/v/d. Denies change in symptoms since earlier this morning. States it does hurt to urinate.  Denies any SI/HI. Denies concern for foreign bodies in rectum or penis.    Past Medical History  Diagnosis Date  . Diabetes mellitus without complication   . Hypertension   . Schizophrenia   . CHF (congestive heart failure)   . Neuropathy    History reviewed. No pertinent past surgical history. Family History  Problem Relation Age of Onset  . Hypertension Other   . Diabetes Other    History  Substance Use Topics  . Smoking status: Current Every Day Smoker -- 1.00 packs/day    Types: Cigarettes  . Smokeless tobacco: Current User  . Alcohol Use: Yes     Comment: wine     Review of Systems  Constitutional: Negative for fever and chills.  Gastrointestinal: Negative for nausea, vomiting, abdominal pain and diarrhea.  Genitourinary: Positive for dysuria, urgency, frequency and penile pain. Negative for hematuria, flank pain, decreased urine volume, penile swelling, scrotal swelling and testicular pain.  Skin: Negative for color change and wound.  Psychiatric/Behavioral: Negative for self-injury. The patient is not nervous/anxious.   All other systems reviewed and are negative.     Allergies  Haldol  Home Medications   Prior to  Admission medications   Medication Sig Start Date End Date Taking? Authorizing Provider  acetaminophen (TYLENOL) 500 MG tablet Take 1 tablet (500 mg total) by mouth every 6 (six) hours as needed for mild pain or moderate pain. 03/24/14   Junius Finner, PA-C  ARIPiprazole (ABILIFY) 5 MG tablet Take 1 tablet (5 mg total) by mouth 2 (two) times daily after a meal. 03/07/14   Nanine Means, NP  cloNIDine (CATAPRES) 0.2 MG tablet Take 1 tablet (0.2 mg total) by mouth 2 (two) times daily. 03/07/14   Nanine Means, NP  gabapentin (NEURONTIN) 100 MG capsule Take 2 capsules (200 mg total) by mouth 2 (two) times daily. 03/07/14   Nanine Means, NP  levofloxacin (LEVAQUIN) 750 MG tablet Take 1 tablet (750 mg total) by mouth daily. 03/24/14   Junius Finner, PA-C  lisinopril (PRINIVIL,ZESTRIL) 20 MG tablet Take 1 tablet (20 mg total) by mouth daily. For high blood pressure. 03/07/14   Nanine Means, NP  metFORMIN (GLUCOPHAGE) 500 MG tablet Take 1 tablet (500 mg total) by mouth 2 (two) times daily with a meal. 03/07/14   Nanine Means, NP  phenazopyridine (PYRIDIUM) 200 MG tablet Take 1 tablet (200 mg total) by mouth 3 (three) times daily. 03/24/14   Junius Finner, PA-C  traZODone (DESYREL) 100 MG tablet Take 1 tablet (100 mg total) by mouth at bedtime. 03/07/14   Nanine Means, NP  trihexyphenidyl (ARTANE) 5 MG tablet Take 1 tablet (5 mg total) by mouth 2 (two) times daily with a meal. 03/07/14   Nanine Means, NP  BP 132/81  Pulse 81  Temp(Src) 98.4 F (36.9 C) (Oral)  Resp 18  SpO2 100% Physical Exam  Nursing note and vitals reviewed. Constitutional: He is oriented to person, place, and time. He appears well-developed and well-nourished.  Disheveled appearing male sitting in exam chair. NAD  HENT:  Head: Normocephalic and atraumatic.  Eyes: EOM are normal.  Neck: Normal range of motion.  Cardiovascular: Normal rate.   Pulmonary/Chest: Effort normal.  Musculoskeletal: Normal range of motion.  Neurological: He is alert  and oriented to person, place, and time.  Skin: Skin is warm and dry.  Psychiatric: His behavior is normal. His mood appears anxious.    ED Course  Procedures (including critical care time) Labs Review Labs Reviewed - No data to display  Imaging Review Dg Abd 2 Views  03/23/2014   CLINICAL DATA:  Urinary incontinence.  EXAM: ABDOMEN - 2 VIEW  COMPARISON:  09/14/2008  FINDINGS: Moderate volume of formed stool, mainly in the proximal colon. No evidence of bowel obstruction or perforation. No concerning intra-abdominal mass effect or calcification. Bone islands in the proximal femora bilaterally, stable from 2010. Prominent heart size. Clear lung bases. Metallic foreign body overlaps the right base. This could represent a pellet within the soft tissues. Abdominal aortic atherosclerosis.  IMPRESSION: Nonobstructive bowel gas pattern.   Electronically Signed   By: Tiburcio Pea M.D.   On: 03/23/2014 05:56     EKG Interpretation None      MDM   Final diagnoses:  Medication refill  Dysuria   Pt presenting to ED with c/o penial pain and dysuria w/o change from earlier this morning when he was seen for same. Medical records reviewed. Pt was given Levaquin and evaluated for possible foreign body in rectum, imaging showed non-obstructinve bowel gas pattern w/o evidence of foreign body.  Pt did not want to be reevaluated. Pt stated "all I want is tylenol, a bus pass and a refill of my antibiotics because i miss-placed my papers." tylenol given in ED along with refill on levoquin and Rx of pyridium.     Junius Finner, PA-C 03/24/14 0020

## 2014-03-25 ENCOUNTER — Encounter (HOSPITAL_COMMUNITY): Payer: Self-pay | Admitting: Emergency Medicine

## 2014-03-25 ENCOUNTER — Emergency Department (HOSPITAL_COMMUNITY)
Admission: EM | Admit: 2014-03-25 | Discharge: 2014-03-25 | Disposition: A | Discharge status: Home / Self Care | Payer: Medicare Other | Attending: Emergency Medicine | Admitting: Emergency Medicine

## 2014-03-25 DIAGNOSIS — I509 Heart failure, unspecified: Secondary | ICD-10-CM | POA: Insufficient documentation

## 2014-03-25 DIAGNOSIS — F259 Schizoaffective disorder, unspecified: Secondary | ICD-10-CM

## 2014-03-25 DIAGNOSIS — Z79899 Other long term (current) drug therapy: Secondary | ICD-10-CM | POA: Insufficient documentation

## 2014-03-25 DIAGNOSIS — I10 Essential (primary) hypertension: Secondary | ICD-10-CM | POA: Insufficient documentation

## 2014-03-25 DIAGNOSIS — R1084 Generalized abdominal pain: Secondary | ICD-10-CM | POA: Insufficient documentation

## 2014-03-25 DIAGNOSIS — E119 Type 2 diabetes mellitus without complications: Secondary | ICD-10-CM | POA: Insufficient documentation

## 2014-03-25 DIAGNOSIS — R45851 Suicidal ideations: Secondary | ICD-10-CM | POA: Insufficient documentation

## 2014-03-25 DIAGNOSIS — F17200 Nicotine dependence, unspecified, uncomplicated: Secondary | ICD-10-CM | POA: Insufficient documentation

## 2014-03-25 DIAGNOSIS — Z792 Long term (current) use of antibiotics: Secondary | ICD-10-CM | POA: Insufficient documentation

## 2014-03-25 DIAGNOSIS — F200 Paranoid schizophrenia: Secondary | ICD-10-CM | POA: Insufficient documentation

## 2014-03-25 DIAGNOSIS — F1910 Other psychoactive substance abuse, uncomplicated: Secondary | ICD-10-CM

## 2014-03-25 DIAGNOSIS — G589 Mononeuropathy, unspecified: Secondary | ICD-10-CM | POA: Insufficient documentation

## 2014-03-25 DIAGNOSIS — F1410 Cocaine abuse, uncomplicated: Secondary | ICD-10-CM

## 2014-03-25 DIAGNOSIS — R109 Unspecified abdominal pain: Secondary | ICD-10-CM

## 2014-03-25 DIAGNOSIS — F1994 Other psychoactive substance use, unspecified with psychoactive substance-induced mood disorder: Secondary | ICD-10-CM | POA: Insufficient documentation

## 2014-03-25 DIAGNOSIS — F1210 Cannabis abuse, uncomplicated: Secondary | ICD-10-CM | POA: Insufficient documentation

## 2014-03-25 LAB — CBC WITH DIFFERENTIAL/PLATELET
Basophils Absolute: 0 10*3/uL (ref 0.0–0.1)
Basophils Relative: 0 % (ref 0–1)
EOS PCT: 1 % (ref 0–5)
Eosinophils Absolute: 0 10*3/uL (ref 0.0–0.7)
HEMATOCRIT: 32 % — AB (ref 39.0–52.0)
Hemoglobin: 10.4 g/dL — ABNORMAL LOW (ref 13.0–17.0)
LYMPHS ABS: 1.3 10*3/uL (ref 0.7–4.0)
LYMPHS PCT: 23 % (ref 12–46)
MCH: 28.8 pg (ref 26.0–34.0)
MCHC: 32.5 g/dL (ref 30.0–36.0)
MCV: 88.6 fL (ref 78.0–100.0)
MONO ABS: 0.4 10*3/uL (ref 0.1–1.0)
Monocytes Relative: 7 % (ref 3–12)
Neutro Abs: 3.9 10*3/uL (ref 1.7–7.7)
Neutrophils Relative %: 69 % (ref 43–77)
Platelets: 228 10*3/uL (ref 150–400)
RBC: 3.61 MIL/uL — ABNORMAL LOW (ref 4.22–5.81)
RDW: 13.5 % (ref 11.5–15.5)
WBC: 5.7 10*3/uL (ref 4.0–10.5)

## 2014-03-25 LAB — RAPID URINE DRUG SCREEN, HOSP PERFORMED
AMPHETAMINES: NOT DETECTED
BARBITURATES: NOT DETECTED
BENZODIAZEPINES: NOT DETECTED
Cocaine: POSITIVE — AB
Opiates: NOT DETECTED
TETRAHYDROCANNABINOL: POSITIVE — AB

## 2014-03-25 LAB — COMPREHENSIVE METABOLIC PANEL
ALBUMIN: 3.5 g/dL (ref 3.5–5.2)
ALT: 6 U/L (ref 0–53)
AST: 15 U/L (ref 0–37)
Alkaline Phosphatase: 72 U/L (ref 39–117)
Anion gap: 13 (ref 5–15)
BILIRUBIN TOTAL: 0.5 mg/dL (ref 0.3–1.2)
BUN: 18 mg/dL (ref 6–23)
CHLORIDE: 109 meq/L (ref 96–112)
CO2: 26 mEq/L (ref 19–32)
Calcium: 9.5 mg/dL (ref 8.4–10.5)
Creatinine, Ser: 0.78 mg/dL (ref 0.50–1.35)
GFR calc Af Amer: >90 mL/min (ref >90)
GFR calc non Af Amer: >90 mL/min (ref >90)
Glucose, Bld: 132 mg/dL — ABNORMAL HIGH (ref 70–99)
POTASSIUM: 4 meq/L (ref 3.7–5.3)
Sodium: 148 mEq/L — ABNORMAL HIGH (ref 137–147)
Total Protein: 6.5 g/dL (ref 6.0–8.3)

## 2014-03-25 LAB — ETHANOL

## 2014-03-25 LAB — ACETAMINOPHEN LEVEL: Acetaminophen (Tylenol), Serum: <15 ug/mL (ref 10–30)

## 2014-03-25 LAB — LIPASE, BLOOD: Lipase: 19 U/L (ref 11–59)

## 2014-03-25 LAB — SALICYLATE LEVEL: Salicylate Lvl: <2 mg/dL — ABNORMAL LOW (ref 2.8–20.0)

## 2014-03-25 MED ORDER — ONDANSETRON HCL 4 MG PO TABS: 4.0000 mg | ORAL_TABLET | Freq: Three times a day (TID) | ORAL | Status: DC | PRN

## 2014-03-25 MED ORDER — TRIHEXYPHENIDYL HCL 5 MG PO TABS
5.0000 mg | ORAL_TABLET | Freq: Two times a day (BID) | ORAL | Status: DC
  Filled 2014-03-25 (×2): qty 1

## 2014-03-25 MED ORDER — LEVOFLOXACIN 750 MG PO TABS
750.0000 mg | ORAL_TABLET | Freq: Every day | ORAL | Status: DC
  Administered 2014-03-25: 750 mg via ORAL
  Filled 2014-03-25: qty 1

## 2014-03-25 MED ORDER — ALUM & MAG HYDROXIDE-SIMETH 200-200-20 MG/5ML PO SUSP: 30.0000 mL | ORAL | Status: DC | PRN

## 2014-03-25 MED ORDER — ARIPIPRAZOLE 5 MG PO TABS: 5.0000 mg | ORAL_TABLET | Freq: Two times a day (BID) | ORAL | Status: DC

## 2014-03-25 MED ORDER — TRAZODONE HCL 50 MG PO TABS: 100.0000 mg | ORAL_TABLET | Freq: Every day | ORAL | Status: DC

## 2014-03-25 MED ORDER — DOCUSATE SODIUM 100 MG PO CAPS
100.0000 mg | ORAL_CAPSULE | Freq: Two times a day (BID) | ORAL | Status: DC | PRN
  Administered 2014-03-25: 100 mg via ORAL
  Filled 2014-03-25 (×2): qty 1

## 2014-03-25 MED ORDER — ACETAMINOPHEN 500 MG PO TABS
500.0000 mg | ORAL_TABLET | Freq: Four times a day (QID) | ORAL | Status: DC | PRN
  Administered 2014-03-25: 500 mg via ORAL
  Filled 2014-03-25: qty 1

## 2014-03-25 MED ORDER — ARIPIPRAZOLE 5 MG PO TABS
5.0000 mg | ORAL_TABLET | Freq: Two times a day (BID) | ORAL | Status: DC
  Administered 2014-03-25: 5 mg via ORAL
  Filled 2014-03-25 (×4): qty 1

## 2014-03-25 MED ORDER — ACETAMINOPHEN 500 MG PO TABS: 1000.0000 mg | ORAL_TABLET | Freq: Once | ORAL | Status: DC

## 2014-03-25 MED ORDER — BENZTROPINE MESYLATE 0.5 MG PO TABS: 0.5000 mg | ORAL_TABLET | Freq: Two times a day (BID) | ORAL | Status: DC

## 2014-03-25 MED ORDER — CLONIDINE HCL 0.2 MG PO TABS
0.2000 mg | ORAL_TABLET | Freq: Two times a day (BID) | ORAL | Status: DC
  Administered 2014-03-25: 0.2 mg via ORAL
  Filled 2014-03-25: qty 1

## 2014-03-25 MED ORDER — LISINOPRIL 20 MG PO TABS
20.0000 mg | ORAL_TABLET | Freq: Every day | ORAL | Status: DC
  Administered 2014-03-25: 20 mg via ORAL
  Filled 2014-03-25: qty 1

## 2014-03-25 MED ORDER — ZOLPIDEM TARTRATE 5 MG PO TABS: 5.0000 mg | ORAL_TABLET | Freq: Every evening | ORAL | Status: DC | PRN

## 2014-03-25 MED ORDER — PHENAZOPYRIDINE HCL 100 MG PO TABS
200.0000 mg | ORAL_TABLET | Freq: Three times a day (TID) | ORAL | Status: DC
  Administered 2014-03-25: 200 mg via ORAL
  Filled 2014-03-25: qty 2

## 2014-03-25 MED ORDER — NICOTINE 21 MG/24HR TD PT24: 21.0000 mg | MEDICATED_PATCH | Freq: Every day | TRANSDERMAL | Status: DC

## 2014-03-25 MED ORDER — IBUPROFEN 400 MG PO TABS: 600.0000 mg | ORAL_TABLET | Freq: Three times a day (TID) | ORAL | Status: DC | PRN

## 2014-03-25 MED ORDER — METFORMIN HCL 500 MG PO TABS
500.0000 mg | ORAL_TABLET | Freq: Two times a day (BID) | ORAL | Status: DC
  Administered 2014-03-25: 500 mg via ORAL
  Filled 2014-03-25: qty 1

## 2014-03-25 MED ORDER — LORAZEPAM 1 MG PO TABS
1.0000 mg | ORAL_TABLET | Freq: Four times a day (QID) | ORAL | Status: DC | PRN
  Administered 2014-03-25: 1 mg via ORAL
  Filled 2014-03-25: qty 1

## 2014-03-25 NOTE — Consult Note (Signed)
[name] Psychiatry Consult   Reason for Consult:  cocaine abuse and suicidal ideqation with plan. Referring Physician:  Dr. Gala [name] is an 53 y.o. male. Total Time spent with patient: 45 minutes  Assessment: AXIS I:  Schizoaffective Disorder, Substance Abuse and Substance Induced Mood Disorder AXIS II:  Deferred AXIS III:   Past Medical History  Diagnosis Date  . Diabetes mellitus without complication   . Hypertension   . Schizophrenia   . CHF (congestive heart failure)   . Neuropathy    AXIS IV:  other psychosocial or environmental problems, problems related to social environment and problems with primary support group AXIS V:  41-50 serious symptoms  Plan: Recommended Abilify 5 mg PO BId and Cogentin 0.62m PO BID, may provide five days script and [facility] to [facility] or others at [facility].   Patient does not meet criteria for psychiatric inpatient admission. Supportive therapy provided about ongoing stressors. Discussed crisis plan, support from social network, calling 911, coming to the Emergency Department, and calling Suicide Hotline. Appreciate psychiatric consultation Please contact [phone] if needs further assistance  Subjective:   Taylor ROWLANDis a 53y.o. male patient admitted with cocaine abuse and suicidal ideqation with plan.  HPI:  Patient is seen and chart [facility]. Patient stated that he has been suffering with chronic schizophrenia and cocaine abuse. He has denied current suicidal or homicidal ideation, intention or plans. he is contracting for safety and asked script for abilily and cogentin [facility] He has been non complaint with medications. He has cousin who is supportive to him. Please see below notes for further details.   [name] is an 53y.o. male. [facility] he was having abdominal pain and had been using crack cocaine. Pt then stated he had SI with plan to walk out in front of train. Tele assessment scheduled with pt's nurse with this clinician for 0800 and completed @ 0800. Pt stated "I have a problem with depression and I need some help. I am sick!" They are trying to poison my food with snakes." Pt also shared that he was recently released from jail after serving 50 days for trespassing and having drug paraphernalia. Pt denies any previous suicide attempts but shared that he has been hospitalized on multiple occasions. Pt reported [facility] however he has not been in several months and has not been on his medications by report. Pt did endorse depressive symptoms Pt denies HI. However, pt reporting visual and tactile hallucinations and delusions. Pt reported that he has been abusing crack/cocaine for many years not and he most recently used an unknown amount last night.  Pt is alert and oriented x 2. Pt is calm and cooperative at this time, although presents with depressed and anxious mood. Pt maintained good eye contact throughout this assessment. Pt's speech was pressured and rapid at times. Pt thought process was tangential and his judgement is fair. Pt did not report any physical or sexual abuse. Pt denies access to weapons. Pt reported that he is homeless and when asked about family support pt stated "I m an orphan, all my family is dead." Pt stated he thinks he has children from "one night stands," but is not  sure. Inpatient psychiatric treatment is recommended for the pt due to report of hallucinations, delusions and SI.    HPI Elements:   Location:  depression with suicidal ideation. Quality:  poor. Severity:  acute. Timing:  intoxication, cocaine crash.  Past Psychiatric History: Past Medical History  Diagnosis Date  .  Diabetes mellitus without complication   . Hypertension   . Schizophrenia   . CHF (congestive heart failure)   . Neuropathy     reports that he has been smoking Cigarettes.  He has been smoking about 1.00 pack per day. He uses smokeless tobacco. He reports that he drinks alcohol. He reports that he uses illicit drugs ("Crack" cocaine and Cocaine) about 7 times per week. Family History  Problem Relation Age of Onset  . Hypertension Other   . Diabetes Other    Family History Substance Abuse: No Family Supports: No Living Arrangements: Other (Comment) (homeless) Can pt return to current living arrangement?: Yes Abuse/Neglect [facility]) Physical Abuse: Denies Verbal Abuse: Denies Sexual Abuse: Denies Allergies:   Allergies  Allergen Reactions  . Haldol [Haloperidol] Other (See Comments)    Muscle spasms, loss of voluntary movement    ACT Assessment Complete:  Yes:    Educational Status    Risk to Self: Risk to self with the past 6 months Suicidal Ideation: Yes-Currently Present Suicidal Intent: Yes-Currently Present Is patient at risk for suicide?: Yes Suicidal Plan?: Yes-Currently Present Specify Current Suicidal Plan: to get run over by a train Access to Means: Yes Specify Access to Suicidal Means: pt can walk out in front of train What has been your use of drugs/alcohol within the last 12 months?: pt reports daily crack cocaine use Previous Attempts/Gestures: No How many times?: 0 Other Self Harm Risks: na - pt denies Triggers for Past Attempts: None known Intentional Self Injurious Behavior: None Family Suicide History: No Recent stressful life event(s): Other (Comment) (believes he is being poisoned by snakes in his food) Persecutory voices/beliefs?: Yes Depression: Yes Depression Symptoms: Despondent;Loss of interest in usual pleasures;Feeling worthless/self pity Substance abuse history and/or treatment for substance abuse?: Yes (CRACK ABUSE) Suicide prevention  information given to non-admitted patients: Not applicable  Risk to Others: Risk to Others within the past 6 months Homicidal Ideation: No Thoughts of Harm to Others: No Comment - Thoughts of Harm to Others: na - pt denies Current Homicidal Intent: No Current Homicidal Plan: No Describe Current Homicidal Plan: na - pt denies Access to Homicidal Means: No Describe Access to Homicidal Means: na Identified Victim: na History of harm to others?: No Assessment of Violence: None Noted Violent Behavior Description: pt calm, cooperative Does patient have access to weapons?: No Criminal Charges Pending?: No Does patient have a court date: No  Abuse: Abuse/Neglect Assessment (Assessment to be complete while patient is alone) Physical Abuse: Denies Verbal Abuse: Denies Sexual Abuse: Denies Exploitation of patient/patient's resources: Denies Self-Neglect: Denies  Prior Inpatient Therapy: Prior Inpatient Therapy Prior Inpatient Therapy: Yes Prior Therapy Dates: "1986-present" Prior Therapy Facilty/Provider(s): Mollie [facility]; [facility]; [place] Reason for Treatment: Schizophrenia  Prior Outpatient Therapy: Prior Outpatient Therapy Prior Outpatient Therapy: Yes Prior Therapy Dates: 2015 Prior Therapy Facilty/Provider(s): [name] Reason for Treatment: Schizophrenia   Additional Information: Additional Information 1:1 In Past 12 Months?: No CIRT Risk: No Elopement Risk: No Does patient have medical clearance?: Yes   Objective: Blood pressure 131/70, pulse 75, temperature 97.8 F (36.6 C), temperature source Oral, resp. rate 19, SpO2 99.00%.There is no weight on file to  calculate BMI. Results for orders placed during the hospital encounter of 03/25/14 (from the past 72 hour(s))  COMPREHENSIVE METABOLIC PANEL     Status: Abnormal   Collection Time    03/25/14  4:25 AM      Result Value Ref Range   Sodium 148 (*) 137 - 147 mEq/L   Potassium 4.0  3.7 - 5.3 mEq/L   Chloride 109  96 -  112 mEq/L   CO2 26  19 - 32 mEq/L   Glucose, Bld 132 (*) 70 - 99 mg/dL   BUN 18  6 - 23 mg/dL   Creatinine, Ser 0.78  0.50 - 1.35 mg/dL   Calcium 9.5  8.4 - 10.5 mg/dL   Total Protein 6.5  6.0 - 8.3 g/dL   Albumin 3.5  3.5 - 5.2 g/dL   AST 15  0 - 37 U/L   ALT 6  0 - 53 U/L   Alkaline Phosphatase 72  39 - 117 U/L   Total Bilirubin 0.5  0.3 - 1.2 mg/dL   GFR calc non Af Amer >90  >90 mL/min   GFR calc Af Amer >90  >90 mL/min   Comment: (NOTE)     The eGFR has been calculated using the CKD EPI equation.     This calculation has not been validated in all clinical situations.     eGFR's persistently <90 mL/min signify possible Chronic Kidney     Disease.   Anion gap 13  5 - 15  CBC WITH DIFFERENTIAL     Status: Abnormal   Collection Time    03/25/14  4:25 AM      Result Value Ref Range   WBC 5.7  4.0 - 10.5 K/uL   RBC 3.61 (*) 4.22 - 5.81 MIL/uL   Hemoglobin 10.4 (*) 13.0 - 17.0 g/dL   HCT 32.0 (*) 39.0 - 52.0 %   MCV 88.6  78.0 - 100.0 fL   MCH 28.8  26.0 - 34.0 pg   MCHC 32.5  30.0 - 36.0 g/dL   RDW 13.5  11.5 - 15.5 %   Platelets 228  150 - 400 K/uL   Neutrophils Relative % 69  43 - 77 %   Neutro Abs 3.9  1.7 - 7.7 K/uL   Lymphocytes Relative 23  12 - 46 %   Lymphs Abs 1.3  0.7 - 4.0 K/uL   Monocytes Relative 7  3 - 12 %   Monocytes Absolute 0.4  0.1 - 1.0 K/uL   Eosinophils Relative 1  0 - 5 %   Eosinophils Absolute 0.0  0.0 - 0.7 K/uL   Basophils Relative 0  0 - 1 %   Basophils Absolute 0.0  0.0 - 0.1 K/uL  LIPASE, BLOOD     Status: None   Collection Time    03/25/14  4:25 AM      Result Value Ref Range   Lipase 19  11 - 59 U/L  ETHANOL     Status: None   Collection Time    03/25/14  4:25 AM      Result Value Ref Range   Alcohol, Ethyl (B) <11  0 - 11 mg/dL   Comment:            LOWEST DETECTABLE LIMIT FOR     SERUM ALCOHOL IS 11 mg/dL     FOR MEDICAL PURPOSES ONLY  ACETAMINOPHEN LEVEL     Status: None   Collection Time    03/25/14  4:25 AM      Result  Value Ref Range   Acetaminophen (Tylenol), Serum <15.0  10 - 30 ug/mL   Comment:            THERAPEUTIC CONCENTRATIONS VARY     SIGNIFICANTLY. A RANGE OF 10-30     ug/mL MAY BE AN EFFECTIVE     CONCENTRATION FOR MANY PATIENTS.     HOWEVER, SOME ARE BEST TREATED     AT CONCENTRATIONS OUTSIDE THIS     RANGE.     ACETAMINOPHEN CONCENTRATIONS     >150 ug/mL AT 4 HOURS AFTER     INGESTION AND >50 ug/mL AT 12     HOURS AFTER INGESTION ARE     OFTEN ASSOCIATED WITH TOXIC     REACTIONS.  SALICYLATE LEVEL     Status: Abnormal   Collection Time    03/25/14  4:25 AM      Result Value Ref Range   Salicylate Lvl <9.1 (*) 2.8 - 20.0 mg/dL  URINE RAPID DRUG SCREEN (HOSP PERFORMED)     Status: Abnormal   Collection Time    03/25/14  7:37 AM      Result Value Ref Range   Opiates NONE DETECTED  NONE DETECTED   Cocaine POSITIVE (*) NONE DETECTED   Benzodiazepines NONE DETECTED  NONE DETECTED   Amphetamines NONE DETECTED  NONE DETECTED   Tetrahydrocannabinol POSITIVE (*) NONE DETECTED   Barbiturates NONE DETECTED  NONE DETECTED   Comment:            DRUG SCREEN FOR MEDICAL PURPOSES     ONLY.  IF CONFIRMATION IS NEEDED     FOR ANY PURPOSE, NOTIFY LAB     WITHIN 5 DAYS.                LOWEST DETECTABLE LIMITS     FOR URINE DRUG SCREEN     Drug Class       Cutoff (ng/mL)     Amphetamine      1000     Barbiturate      200     Benzodiazepine   638     Tricyclics       466     Opiates          300     Cocaine          300     THC              50   Labs are reviewed and are pertinent for cocaine and THC.  Current Facility-Administered Medications  Medication Dose Route Frequency Provider Last Rate Last Dose  . acetaminophen (TYLENOL) tablet 1,000 mg  1,000 mg Oral Once Mariea Clonts, MD      . acetaminophen (TYLENOL) tablet 500 mg  500 mg Oral Q6H PRN Mariea Clonts, MD   500 mg at 03/25/14 0855  . alum & mag hydroxide-simeth (MAALOX/MYLANTA) 200-200-20 MG/5ML suspension 30 mL  30 mL  Oral PRN Johnna Acosta, MD      . ARIPiprazole (ABILIFY) tablet 5 mg  5 mg Oral BID PC Mariea Clonts, MD   5 mg at 03/25/14 0855  . cloNIDine (CATAPRES) tablet 0.2 mg  0.2 mg Oral BID Johnna Acosta, MD   0.2 mg at 03/25/14 0957  . docusate sodium (COLACE) capsule 100 mg  100 mg Oral BID PRN Johnna Acosta, MD      . ibuprofen (ADVIL,MOTRIN) tablet 600  mg  600 mg Oral Q8H PRN Johnna Acosta, MD      . levofloxacin [facility]) tablet 750 mg  750 mg Oral Daily Johnna Acosta, MD   750 mg at 03/25/14 0957  . lisinopril (PRINIVIL,ZESTRIL) tablet 20 mg  20 mg Oral Daily Johnna Acosta, MD   20 mg at 03/25/14 5035  . LORazepam (ATIVAN) tablet 1 mg  1 mg Oral Q6H PRN Johnna Acosta, MD   1 mg at 03/25/14 4656  . metFORMIN (GLUCOPHAGE) tablet 500 mg  500 mg Oral BID WC Mariea Clonts, MD   500 mg at 03/25/14 0855  . nicotine (NICODERM CQ - dosed in mg/24 hours) patch 21 mg  21 mg Transdermal Daily Johnna Acosta, MD      . ondansetron [facility]) tablet 4 mg  4 mg Oral Q8H PRN Johnna Acosta, MD      . phenazopyridine (PYRIDIUM) tablet 200 mg  200 mg Oral TID WC Johnna Acosta, MD      . traZODone (DESYREL) tablet 100 mg  100 mg Oral QHS Mariea Clonts, MD      . trihexyphenidyl (ARTANE) tablet 5 mg  5 mg Oral BID WC Johnna Acosta, MD      . zolpidem [facility]) tablet 5 mg  5 mg Oral QHS PRN Johnna Acosta, MD       Current Outpatient Prescriptions  Medication Sig Dispense Refill  . acetaminophen (TYLENOL) 500 MG tablet Take 1 tablet (500 mg total) by mouth every 6 (six) hours as needed for mild pain or moderate pain.  30 tablet  0  . ARIPiprazole (ABILIFY) 5 MG tablet Take 1 tablet (5 mg total) by mouth 2 (two) times daily after a meal.  60 tablet  0  . cloNIDine (CATAPRES) 0.2 MG tablet Take 1 tablet (0.2 mg total) by mouth 2 (two) times daily.  60 tablet  0  . gabapentin (NEURONTIN) 100 MG capsule Take 2 capsules (200 mg total) by mouth 2 (two) times daily.  120 capsule  0  . levofloxacin (LEVAQUIN)  750 MG tablet Take 1 tablet (750 mg total) by mouth daily.  9 tablet  0  . lisinopril (PRINIVIL,ZESTRIL) 20 MG tablet Take 1 tablet (20 mg total) by mouth daily. For high blood pressure.  30 tablet  0  . metFORMIN (GLUCOPHAGE) 500 MG tablet Take 1 tablet (500 mg total) by mouth 2 (two) times daily with a meal.  60 tablet  0  . phenazopyridine (PYRIDIUM) 200 MG tablet Take 1 tablet (200 mg total) by mouth 3 (three) times daily.  6 tablet  0  . traZODone (DESYREL) 100 MG tablet Take 1 tablet (100 mg total) by mouth at bedtime.  30 tablet  0  . trihexyphenidyl (ARTANE) 5 MG tablet Take 1 tablet (5 mg total) by mouth 2 (two) times daily with a meal.  60 tablet  0    Psychiatric Specialty Exam: Physical Exam Full physical performed in Emergency Department. I have reviewed this assessment and concur with its findings.   Review of Systems  Gastrointestinal: Positive for abdominal pain.  Psychiatric/Behavioral: Positive for hallucinations.    Blood pressure 131/70, pulse 75, temperature 97.8 F (36.6 C), temperature source Oral, resp. rate 19, SpO2 99.00%.There is no weight on file to calculate BMI.  General Appearance: Disheveled  Eye Contact::  Good  Speech:  Clear and Coherent  Volume:  Normal  Mood:  Irritable  Affect:  Appropriate and  Congruent  Thought Process:  Coherent and Goal Directed  Orientation:  Full (Time, Place, and Person)  Thought Content:  WDL  Suicidal Thoughts:  No  Homicidal Thoughts:  No  Memory:  Immediate;   Fair Recent;   Fair  Judgement:  Intact  Insight:  Fair  Psychomotor Activity:  Normal  Concentration:  Good  Recall:  Good  Fund of Knowledge:Good  Language: Good  Akathisia:  NA  Handed:  Right  AIMS (if indicated):     Assets:  Communication Skills Desire for Improvement Intimacy Leisure Time Physical Health Resilience Social Support  Sleep:      Musculoskeletal: Strength & Muscle Tone: within normal limits Gait & Station: normal Patient  leans: N/A  Treatment Plan Summary: Daily contact with patient to assess and evaluate symptoms and progress in treatment Medication management Recommended Abilify 5 mg PO BId and Cogentin 0.29m PO BID, may provide five days script and [facility] 03/25/2014 10:55 AM

## 2014-03-25 NOTE — Progress Notes (Signed)
Dr. Elsie Saas asked this CM to contact Candice Brackie per the patient request for a ride home upon discharge. This Cm spoke with Candice and she stated that she can pick the patient up at 4:00pm when she gets off work. Confirmed that patient will be in the ED at 4pm and contacted Candice again at [phone] and left a message that the patient will be ready for discharge and pick up at 4:00 pm and left this Cm contact information. Patient is agreeable to wait for his cousin to pick up upon discharge. Staff RN Drinda Butts aware. Ferdinand Cava, RN, BSN, Case Manager [phone] 03/25/2014 11:58 AM

## 2014-03-25 NOTE — Discharge Instructions (Signed)
Stimulant Use Disorder-Cocaine °Cocaine is one of a group of powerful drugs called stimulants. Cocaine has medical uses for stopping nosebleeds and for pain control before minor nose or dental surgery. However, cocaine is misused because of the effects that it produces. These effects include:  °· A feeling of extreme pleasure. °· Alertness. °· High energy. °Common street names for cocaine include coke, crack, blow, snow, and nose candy. Cocaine is snorted, dissolved in water and injected, or smoked.  °Stimulants are addictive because they activate regions of the brain that produce both the pleasurable sensation of "reward" and psychological dependence. Together, these actions account for loss of control and the rapid development of drug dependence. This means you become ill without the drug (withdrawal) and need to keep using it to function.  °Stimulant use disorder is use of stimulants that disrupts your daily life. It disrupts relationships with family and friends and how you do your job. Cocaine increases your blood pressure and heart rate. It can cause a heart attack or stroke. Cocaine can also cause death from irregular heart rate or seizures. °SYMPTOMS °Symptoms of stimulant use disorder with cocaine include: °· Use of cocaine in larger amounts or over a longer period of time than intended. °· Unsuccessful attempts to cut down or control cocaine use. °· A lot of time spent obtaining, using, or recovering from the effects of cocaine. °· A strong desire or urge to use cocaine (craving). °· Continued use of cocaine in spite of major problems at work, school, or home because of use. °· Continued use of cocaine in spite of relationship problems because of use. °· Giving up or cutting down on important life activities because of cocaine use. °· Use of cocaine over and over in situations when it is physically hazardous, such as driving a car. °· Continued use of cocaine in spite of a physical problem that is likely  related to use. Physical problems can include: °¨ Malnutrition. °¨ Nosebleeds. °¨ Chest pain. °¨ High blood pressure. °¨ A hole that develops between the part of your nose that separates your nostrils (perforated nasal septum). °¨ Lung and kidney damage. °· Continued use of cocaine in spite of a mental problem that is likely related to use. Mental problems can include: °¨ Schizophrenia-like symptoms. °¨ Depression. °¨ Bipolar mood swings. °¨ Anxiety. °¨ Sleep problems. °· Need to use more and more cocaine to get the same effect, or lessened effect over time with use of the same amount of cocaine (tolerance). °· Having withdrawal symptoms when cocaine use is stopped, or using cocaine to reduce or avoid withdrawal symptoms. Withdrawal symptoms include: °¨ Depressed or irritable mood. °¨ Low energy or restlessness. °¨ Bad dreams. °¨ Poor or excessive sleep. °¨ Increased appetite. °DIAGNOSIS °Stimulant use disorder is diagnosed by your health care provider. You may be asked questions about your cocaine use and how it affects your life. A physical exam may be done. A drug screen may be ordered. You may be referred to a mental health professional. The diagnosis of stimulant use disorder requires at least two symptoms within 12 months. The type of stimulant use disorder depends on the number of signs and symptoms you have. The type may be: °· Mild. Two or three signs and symptoms. °· Moderate. Four or five signs and symptoms. °· Severe. Six or more signs and symptoms. °TREATMENT °Treatment for stimulant use disorder is usually provided by mental health professionals with training in substance use disorders. The following options are available: °·   Counseling or talk therapy. Talk therapy addresses the reasons you use cocaine and ways to keep you from using again. Goals of talk therapy include:  Identifying and avoiding triggers for use.  Handling cravings.  Replacing use with healthy activities.  Support groups.  Support groups provide emotional support, advice, and guidance.  Medicine. Certain medicines may decrease cocaine cravings or withdrawal symptoms. HOME CARE INSTRUCTIONS  Take medicines only as directed by your health care provider.  Identify the people and activities that trigger your cocaine use and avoid them.  Keep all follow-up visits as directed by your health care provider. SEEK MEDICAL CARE IF:  Your symptoms get worse or you relapse.  You are not able to take medicines as directed. SEEK IMMEDIATE MEDICAL CARE IF:  You have serious thoughts about hurting yourself or others.  You have a seizure, chest pain, sudden weakness, or loss of speech or vision. FOR MORE INFORMATION  National Institute on Drug Abuse: http://www.price-smith.com/  Substance Abuse and Mental Health Services Administration: SkateOasis.com.pt Document Released: 06/14/2000 Document Revised: 11/01/2013 Document Reviewed: 06/30/2013 [facility] Patient Information 2015 [place], [place]. This information is not intended to replace advice given to you by your health care provider. Make sure you discuss any questions you have with your health care provider. Schizophrenia Schizophrenia is a mental illness. It may cause disturbed or disorganized thinking, speech, or behavior. People with schizophrenia have problems functioning in one or more areas of life: work, school, home, or relationships. People with schizophrenia are at increased risk for suicide, certain chronic physical illnesses, and unhealthy behaviors, such as smoking and drug use. People who have family members with schizophrenia are at higher risk of developing the illness. Schizophrenia affects men and women equally but usually appears at an earlier age (teenage or early adult years) in men.  SYMPTOMS The earliest symptoms are often subtle (prodrome) and may go unnoticed until the illness becomes more severe (first-break psychosis). Symptoms of schizophrenia may be  continuous or may come and go in severity. Episodes often are triggered by major life events, such as family stress, college, [employer], marriage, pregnancy or child birth, divorce, or loss of a loved one. People with schizophrenia may see, hear, or feel things that do not exist (hallucinations). They may have false beliefs in spite of obvious proof to the contrary (delusions). Sometimes speech is incoherent or behavior is odd or withdrawn.  DIAGNOSIS Schizophrenia is diagnosed through an assessment by your caregiver. Your caregiver will ask questions about your thoughts, behavior, mood, and ability to function in daily life. Your caregiver may ask questions about your medical history and use of alcohol or drugs, including prescription medication. Your caregiver may also order blood tests and imaging exams. Certain medical conditions and substances can cause symptoms that resemble schizophrenia. Your caregiver may refer you to a mental health specialist for evaluation. There are three major criterion for a diagnosis of schizophrenia:  Two or more of the following five symptoms are present for a month or longer:  Delusions. Often the delusions are that you are being attacked, harassed, cheated, persecuted or conspired against (persecutory delusions).  Hallucinations.   Disorganized speech that does not make sense to others.  Grossly disorganized (confused or unfocused) behavior or extremely overactive or underactive motor activity (catatonia).  Negative symptoms such as bland or blunted emotions (flat affect), loss of will power (avolition), and withdrawal from social contacts (social isolation).  Level of functioning in one or more major areas of life (work, school, relationships,  or self-care) is markedly below the level of functioning before the onset of illness.   There are continuous signs of illness (either mild symptoms or decreased level of functioning) for at least 6 months or  longer. TREATMENT  Schizophrenia is a long-term illness. It is best controlled with continuous treatment rather than treatment only when symptoms occur. The following treatments are used to manage schizophrenia:  Medication--Medication is the most effective and important form of treatment for schizophrenia. Antipsychotic medications are usually prescribed to help manage schizophrenia. Other types of medication may be added to relieve any symptoms that may occur despite the use of antipsychotic medications.  Counseling or talk therapy--Individual, group, or family counseling may be helpful in providing education, support, and guidance. Many people with schizophrenia also benefit from social skills and job skills (vocational) training. A combination of medication and counseling is best for managing the disorder over time. A procedure in which electricity is applied to the brain through the scalp (electroconvulsive therapy) may be used to treat catatonic schizophrenia or schizophrenia in people who cannot take or do not respond to medication and counseling. Document Released: 06/14/2000 Document Revised: 02/17/2013 Document Reviewed: 09/09/2012 [facility] Patient Information 2015 [place], [place]. This information is not intended to replace advice given to you by your health care provider. Make sure you discuss any questions you have with your health care provider.

## 2014-03-25 NOTE — ED Notes (Signed)
The pt arrived by gems from the street.  He was doing crack smoking and called ems.  He is c/o abd pain  For one week. And he has nit had a bm.  He wants a laxative..  He has beden here numerus time in the past week  Falling asleep in triaged.  Wet from the rain outside.  Scrub shirt and blankets given

## 2014-03-25 NOTE — ED Notes (Signed)
Dr Zavitz at bedside  

## 2014-03-25 NOTE — ED Provider Notes (Signed)
CSN: [id number]     Arrival date & time 03/25/14  0103 History   First MD Initiated Contact with Patient 03/25/14 0240     Chief Complaint  Patient presents with  . Abdominal Pain     (Consider location/radiation/quality/duration/timing/severity/associated sxs/prior Treatment) HPI Comments: 53 year old male with diabetes, cocaine, paranoid schizophrenia, mood disorder presents by EMS after being found during crack in the Street. Patient complained of abdominal pain and has not had a recent bowel movement. Patient has been multiple times recently for different complaints. Patient has been hallucinating recently and feels exam prior bit his stomach. He does have mild vague suicidal ideation that is similar previous, he says he has thought of choking himself. Abdominal discomfort generalized, mild, nonradiating. No abdominal surgery history. Patient has been on Abilify for his psychiatric history of is not taking it recently. No specific reason given.  Patient is a 53 y.o. male presenting with abdominal pain. The history is provided by the patient and the EMS personnel.  Abdominal Pain Associated symptoms: no chest pain, no chills, no diarrhea, no dysuria, no fever, no shortness of breath and no vomiting     Past Medical History  Diagnosis Date  . Diabetes mellitus without complication   . Hypertension   . Schizophrenia   . CHF (congestive heart failure)   . Neuropathy    History reviewed. No pertinent past surgical history. Family History  Problem Relation Age of Onset  . Hypertension Other   . Diabetes Other    History  Substance Use Topics  . Smoking status: Current Every Day Smoker -- 1.00 packs/day    Types: Cigarettes  . Smokeless tobacco: Current User  . Alcohol Use: Yes     Comment: wine     Review of Systems  Constitutional: Negative for fever and chills.  HENT: Negative for congestion.   Eyes: Negative for visual disturbance.  Respiratory: Negative for shortness of  breath.   Cardiovascular: Negative for chest pain.  Gastrointestinal: Positive for abdominal pain. Negative for vomiting and diarrhea.  Genitourinary: Negative for dysuria and flank pain.  Musculoskeletal: Negative for back pain, neck pain and neck stiffness.  Skin: Negative for rash.  Neurological: Negative for light-headedness and headaches.  Psychiatric/Behavioral: Positive for suicidal ideas and hallucinations.      Allergies  Haldol  Home Medications   Prior to Admission medications   Medication Sig Start Date End Date Taking? Authorizing Provider  acetaminophen (TYLENOL) 500 MG tablet Take 1 tablet (500 mg total) by mouth every 6 (six) hours as needed for mild pain or moderate pain. 03/24/14  Yes Junius Finner, PA-C  ARIPiprazole (ABILIFY) 5 MG tablet Take 1 tablet (5 mg total) by mouth 2 (two) times daily after a meal. 03/07/14   Nanine Means, NP  cloNIDine (CATAPRES) 0.2 MG tablet Take 1 tablet (0.2 mg total) by mouth 2 (two) times daily. 03/07/14   Nanine Means, NP  gabapentin (NEURONTIN) 100 MG capsule Take 2 capsules (200 mg total) by mouth 2 (two) times daily. 03/07/14   Nanine Means, NP  levofloxacin (LEVAQUIN) 750 MG tablet Take 1 tablet (750 mg total) by mouth daily. 03/24/14   Junius Finner, PA-C  lisinopril (PRINIVIL,ZESTRIL) 20 MG tablet Take 1 tablet (20 mg total) by mouth daily. For high blood pressure. 03/07/14   Nanine Means, NP  metFORMIN (GLUCOPHAGE) 500 MG tablet Take 1 tablet (500 mg total) by mouth 2 (two) times daily with a meal. 03/07/14   Nanine Means, NP  phenazopyridine (PYRIDIUM) 200  MG tablet Take 1 tablet (200 mg total) by mouth 3 (three) times daily. 03/24/14   Junius Finner, PA-C  traZODone (DESYREL) 100 MG tablet Take 1 tablet (100 mg total) by mouth at bedtime. 03/07/14   Nanine Means, NP  trihexyphenidyl (ARTANE) 5 MG tablet Take 1 tablet (5 mg total) by mouth 2 (two) times daily with a meal. 03/07/14   Nanine Means, NP   BP 127/76  Pulse 76  Temp(Src) 97.8 F  (36.6 C) (Oral)  Resp 16  SpO2 99% Physical Exam  Nursing note and vitals reviewed. Constitutional: He is oriented to person, place, and time. He appears well-developed and well-nourished.  HENT:  Head: Normocephalic and atraumatic.  Eyes: Conjunctivae are normal. Right eye exhibits no discharge. Left eye exhibits no discharge.  Neck: Normal range of motion. Neck supple. No tracheal deviation present.  Cardiovascular: Normal rate and regular rhythm.   Pulmonary/Chest: Effort normal and breath sounds normal.  Abdominal: Soft. He exhibits no distension. There is no tenderness. There is no guarding.  Musculoskeletal: He exhibits no edema.  Neurological: He is alert and oriented to person, place, and time.  Skin: Skin is warm. No rash noted.  Psychiatric: His mood appears not anxious. His affect is not blunt. His speech is not rapid and/or pressured. He is slowed and actively hallucinating. He expresses suicidal ideation. He expresses no homicidal ideation. He expresses suicidal plans. He expresses no homicidal plans.    ED Course  Procedures (including critical care time) Labs Review Labs Reviewed  COMPREHENSIVE METABOLIC PANEL  CBC WITH DIFFERENTIAL  LIPASE, BLOOD  URINE RAPID DRUG SCREEN (HOSP PERFORMED)  ETHANOL  ACETAMINOPHEN LEVEL  SALICYLATE LEVEL    Imaging Review Dg Abd 2 Views  03/23/2014   CLINICAL DATA:  Urinary incontinence.  EXAM: ABDOMEN - 2 VIEW  COMPARISON:  09/14/2008  FINDINGS: Moderate volume of formed stool, mainly in the proximal colon. No evidence of bowel obstruction or perforation. No concerning intra-abdominal mass effect or calcification. Bone islands in the proximal femora bilaterally, stable from 2010. Prominent heart size. Clear lung bases. Metallic foreign body overlaps the right base. This could represent a pellet within the soft tissues. Abdominal aortic atherosclerosis.  IMPRESSION: Nonobstructive bowel gas pattern.   Electronically Signed   By:  Tiburcio Pea M.D.   On: 03/23/2014 05:56     EKG Interpretation None      MDM   Final diagnoses:  Abdominal pain, unspecified abdominal location  Suicidal ideation  Substance induced mood disorder  Paranoid schizophrenia, chronic condition    Patient with multiple visits the ER, homeless, schizophrenia history presents with nonspecific abdominal pain and hallucinations or suicidal ideation. Patient requesting inpatient psychiatry. Patient has had similar presentation in the past. Plan for blood work, behavioral health consult to help arrange outpatient versus inpatient treatment.  Abdominal exam benign at this time. Patient comfortable and cooperative.  TTS pending.     Enid Skeens, MD 03/25/14 [phone]

## 2014-03-25 NOTE — ED Notes (Signed)
Security phoned to come wand pt.

## 2014-03-25 NOTE — BH Assessment (Signed)
[facility] Assessment Progress Note   Clinician attempted numerous times, with help from secretary Victorino Dike, to connect tele assessment machine and was not able to.  The [employer] will not connect with the [employer] cart(s).  Tele assessment will be completed from the [facility] desktop.

## 2014-03-25 NOTE — ED Notes (Signed)
Pt moved to Pod C, transported by tech in a wheelchair.

## 2014-03-25 NOTE — BH Assessment (Addendum)
[facility] Assessment Progress Note   Update:  Dr. Elsie Saas notified @ 1945 regarding seeing the pt and given clinical information.  Pt to be seen  Update:  Pt ran by Shelda Jakes, PA-C @ 1020 and recommended pt be seen by Dr. Elsie Saas for further evaluation and recommendations.  Dr. Elsie Saas will be notified.  Casimer Lanius, MS, [facility] Licensed Professional Counselor Therapeutic Triage Specialist [facility] Phone: [phone] Fax: [phone]

## 2014-03-25 NOTE — ED Notes (Signed)
Patient now stating he is having thoughts of hurting himself by jumping in front of a train.

## 2014-03-25 NOTE — ED Notes (Signed)
TTS at bedside. 

## 2014-03-25 NOTE — ED Notes (Signed)
Security at bedside

## 2014-03-25 NOTE — ED Notes (Signed)
PT GIVEN A BIBLE AND SOME MARKERS TO DRAW WITH

## 2014-03-25 NOTE — ED Notes (Signed)
Pt changed from the blue scrubs to wine colored scrubs.

## 2014-03-25 NOTE — BH Assessment (Addendum)
Tele Assessment Note   [name] is an 53 y.o. male. presenting to [facility] via EMS from street reporting he was having abdominal pain and had been using crack cocaine. Pt then stated he had SI with plan to walk out in front of train.  Tele assessment scheduled with pt's nurse with this clinician for 0800 and completed @ 0800.  Pt stated "I have a problem with depression and I need some help. I am sick!" They are trying to poison my food with snakes." Pt also shared that he was recently released from jail after serving 50 days for trespassing and having drug paraphernalia. Pt denies any previous suicide attempts but shared that he has been hospitalized on multiple occasions. Pt reported that he is receiving mental health treatment through [facility]; however he has not been in several months and has not been on his medications by report. Pt did endorse depressive symptoms Pt denies HI. However, pt reporting visual and tactile hallucinations and delusions.  Pt reported that he has been abusing crack/cocaine for many years not and he most recently used an unknown amount last night. Pt is alert and oriented x 2. Pt is calm and cooperative at this time, although presents with depressed and anxious mood. Pt maintained good eye contact throughout this assessment. Pt's speech was pressured and rapid at times. Pt thought process was tangential and his judgement is fair. Pt did not report any physical or sexual abuse. Pt denies access to weapons.  Pt reported that he is homeless and when asked about family support pt stated "I am an orphan, all my family is dead."  Pt stated he thinks he has children from "one night stands," but is not sure.  Inpatient psychiatric treatment is recommended for the pt due to report of hallucinations, delusions and SI.  Consulted with EDP Horton @ [phone], who was in agreement with disposition.  Pt to be run by [facility] extender.  Updated ED and TTS staff.   Axis I: Chronic Paranoid  Schizophrenia, Cocaine Abuse Axis II: Deferred Axis III:  Past Medical History  Diagnosis Date  . Diabetes mellitus without complication   . Hypertension   . Schizophrenia   . CHF (congestive heart failure)   . Neuropathy    Axis IV: economic problems, housing problems, other psychosocial or environmental problems, problems related to social environment and problems with primary support group Axis V: 11-20 some danger of hurting self or others possible OR occasionally fails to maintain minimal personal hygiene OR gross impairment in communication  Past Medical History:  Past Medical History  Diagnosis Date  . Diabetes mellitus without complication   . Hypertension   . Schizophrenia   . CHF (congestive heart failure)   . Neuropathy     History reviewed. No pertinent past surgical history.  Family History:  Family History  Problem Relation Age of Onset  . Hypertension Other   . Diabetes Other     Social History:  reports that he has been smoking Cigarettes.  He has been smoking about 1.00 pack per day. He uses smokeless tobacco. He reports that he drinks alcohol. He reports that he uses illicit drugs ("Crack" cocaine and Cocaine) about 7 times per week.  Additional Social History:  Alcohol / Drug Use Pain Medications: see med list Prescriptions: see med list Over the Counter: see med list History of alcohol / drug use?: Yes Longest period of sobriety (when/how long): unknown Negative Consequences of Use: Financial;Personal relationships;Work / Science writer  Symptoms:  (pt denies) Substance #1 Name of Substance 1: crack cocaine 1 - Age of First Use: "20's" 1 - Amount (size/oz): "enough to get me high" 1 - Frequency: "whenever I can" 1 - Duration: ongoing  1 - Last Use / Amount: 03/24/14-unknown amt  CIWA: CIWA-Ar BP: 138/76 mmHg Pulse Rate: 75 COWS:    PATIENT STRENGTHS: (choose at least two) General fund of knowledge  Allergies:  Allergies  Allergen  Reactions  . Haldol [Haloperidol] Other (See Comments)    Muscle spasms, loss of voluntary movement    Home Medications:  (Not in a hospital admission)  OB/GYN Status:  No LMP for male patient.  General Assessment Data Location of Assessment: [facility] ED Is this a Tele or Face-to-Face Assessment?: Tele Assessment Is this an Initial Assessment or a Re-assessment for this encounter?: Initial Assessment Living Arrangements: Other (Comment) (homeless) Can pt return to current living arrangement?: Yes Admission Status: Voluntary Is patient capable of signing voluntary admission?: Yes Transfer from: Acute Hospital Referral Source: Self/Family/Friend     [facility] Crisis Care Plan Living Arrangements: Other (Comment) (homeless) Name of Psychiatrist: Transport planner Name of Therapist: NA  Education Status Is patient currently in school?: No Current Grade: NA Highest grade of school patient has completed: 12 Name of school: NA Contact person: NA  Risk to self with the past 6 months Suicidal Ideation: Yes-Currently Present Suicidal Intent: Yes-Currently Present Is patient at risk for suicide?: Yes Suicidal Plan?: Yes-Currently Present Specify Current Suicidal Plan: to get run over by a train Access to Means: Yes Specify Access to Suicidal Means: pt can walk out in front of train What has been your use of drugs/alcohol within the last 12 months?: pt reports daily crack cocaine use Previous Attempts/Gestures: No How many times?: 0 Other Self Harm Risks: na - pt denies Triggers for Past Attempts: None known Intentional Self Injurious Behavior: None Family Suicide History: No Recent stressful life event(s): Other (Comment) (believes he is being poisoned by snakes in his food) Persecutory voices/beliefs?: Yes Depression: Yes Depression Symptoms: Despondent;Loss of interest in usual pleasures;Feeling worthless/self pity Substance abuse history and/or treatment for substance abuse?: Yes Suicide  prevention information given to non-admitted patients: Not applicable  Risk to Others within the past 6 months Homicidal Ideation: No Thoughts of Harm to Others: No Comment - Thoughts of Harm to Others: na - pt denies Current Homicidal Intent: No Current Homicidal Plan: No Describe Current Homicidal Plan: na - pt denies Access to Homicidal Means: No Describe Access to Homicidal Means: na Identified Victim: na History of harm to others?: No Assessment of Violence: None Noted Violent Behavior Description: pt calm, cooperative Does patient have access to weapons?: No Criminal Charges Pending?: No Does patient have a court date: No  Psychosis Hallucinations: Tactile (reports people are trying to poison his food with snakes) Delusions: Persecutory;Somatic (people are trying to poison his food with snakes per pt)  Mental Status Report Appear/Hygiene: Disheveled;In scrubs Eye Contact: Good Motor Activity: Freedom of movement;Unremarkable Speech: Pressured;Logical/coherent Level of Consciousness: Alert Mood: Anxious;Depressed Affect: Anxious;Depressed Anxiety Level: Moderate Thought Processes: Coherent;Circumstantial Judgement: Impaired Orientation: Person;Place Obsessive Compulsive Thoughts/Behaviors: Moderate  Cognitive Functioning Concentration: Decreased Memory: Recent Impaired;Remote Impaired IQ: Average Insight: Poor Impulse Control: Poor Appetite: Fair Weight Loss: 30 Weight Gain: 0 Sleep: No Change Total Hours of Sleep: 8 Vegetative Symptoms: Decreased grooming  ADLScreening [facility]) Patient's cognitive ability adequate to safely complete daily activities?: Yes Patient able to express need for assistance with ADLs?: Yes Independently  performs ADLs?: Yes (appropriate for developmental age)  Prior Inpatient Therapy Prior Inpatient Therapy: Yes Prior Therapy Dates: "1986-present" Prior Therapy Facilty/Provider(s): [facility]; [facility]; [facility] Reason for Treatment: Schizophrenia  Prior Outpatient Therapy Prior Outpatient Therapy: Yes Prior Therapy Dates: 2015 Prior Therapy Facilty/Provider(s): [name] Reason for Treatment: Schizophrenia   ADL Screening (condition at time of admission) Patient's cognitive ability adequate to safely complete daily activities?: Yes Is the patient deaf or have difficulty hearing?: No Does the patient have difficulty seeing, even when wearing glasses/contacts?: No Does the patient have difficulty concentrating, remembering, or making decisions?: No Patient able to express need for assistance with ADLs?: Yes Does the patient have difficulty dressing or bathing?: No Independently performs ADLs?: Yes (appropriate for developmental age) Does the patient have difficulty walking or climbing stairs?: No  Home Assistive Devices/Equipment Home Assistive Devices/Equipment: None    Abuse/Neglect Assessment (Assessment to be complete while patient is alone) Physical Abuse: Denies Verbal Abuse: Denies Sexual Abuse: Denies Exploitation of patient/patient's resources: Denies Self-Neglect: Denies Values / Beliefs Cultural Requests During Hospitalization: None Spiritual Requests During Hospitalization: None Consults Spiritual Care Consult Needed: No Social Work Consult Needed: No Merchant navy officer (For Healthcare) Does patient have an advance directive?: No Would patient like information on creating an advanced directive?: No - patient declined information    Additional Information 1:1 In Past 12 Months?: No CIRT Risk: No Elopement Risk: No Does patient have medical clearance?: Yes     Disposition:  Disposition Initial Assessment Completed for this Encounter: Yes Disposition of Patient: Referred to;Inpatient treatment program Type of inpatient treatment program: Adult  Casimer Lanius, MS, [facility] Licensed Professional Counselor Triage Specialist  03/25/2014 8:43 AM

## 2014-04-01 ENCOUNTER — Emergency Department (HOSPITAL_COMMUNITY)
Admission: EM | Admit: 2014-04-01 | Discharge: 2014-04-02 | Disposition: A | Discharge status: Home / Self Care | Payer: Medicare Other | Attending: Emergency Medicine | Admitting: Emergency Medicine

## 2014-04-01 ENCOUNTER — Encounter (HOSPITAL_COMMUNITY): Payer: Self-pay | Admitting: Emergency Medicine

## 2014-04-01 ENCOUNTER — Emergency Department (HOSPITAL_COMMUNITY)
Admission: EM | Admit: 2014-04-01 | Discharge: 2014-04-01 | Disposition: A | Discharge status: Home / Self Care | Payer: Medicare Other | Attending: Emergency Medicine | Admitting: Emergency Medicine

## 2014-04-01 ENCOUNTER — Emergency Department (HOSPITAL_COMMUNITY): Payer: Medicare Other

## 2014-04-01 DIAGNOSIS — G629 Polyneuropathy, unspecified: Secondary | ICD-10-CM | POA: Insufficient documentation

## 2014-04-01 DIAGNOSIS — Z720 Tobacco use: Secondary | ICD-10-CM | POA: Insufficient documentation

## 2014-04-01 DIAGNOSIS — I10 Essential (primary) hypertension: Secondary | ICD-10-CM | POA: Insufficient documentation

## 2014-04-01 DIAGNOSIS — I509 Heart failure, unspecified: Secondary | ICD-10-CM | POA: Insufficient documentation

## 2014-04-01 DIAGNOSIS — Z79899 Other long term (current) drug therapy: Secondary | ICD-10-CM | POA: Insufficient documentation

## 2014-04-01 DIAGNOSIS — L988 Other specified disorders of the skin and subcutaneous tissue: Secondary | ICD-10-CM | POA: Insufficient documentation

## 2014-04-01 DIAGNOSIS — Z792 Long term (current) use of antibiotics: Secondary | ICD-10-CM | POA: Insufficient documentation

## 2014-04-01 DIAGNOSIS — S90829A Blister (nonthermal), unspecified foot, initial encounter: Secondary | ICD-10-CM

## 2014-04-01 DIAGNOSIS — F14188 Cocaine abuse with other cocaine-induced disorder: Secondary | ICD-10-CM | POA: Insufficient documentation

## 2014-04-01 DIAGNOSIS — Z760 Encounter for issue of repeat prescription: Secondary | ICD-10-CM

## 2014-04-01 DIAGNOSIS — Z8669 Personal history of other diseases of the nervous system and sense organs: Secondary | ICD-10-CM | POA: Insufficient documentation

## 2014-04-01 DIAGNOSIS — E119 Type 2 diabetes mellitus without complications: Secondary | ICD-10-CM | POA: Insufficient documentation

## 2014-04-01 DIAGNOSIS — Z590 Homelessness: Secondary | ICD-10-CM | POA: Insufficient documentation

## 2014-04-01 DIAGNOSIS — F1414 Cocaine abuse with cocaine-induced mood disorder: Secondary | ICD-10-CM

## 2014-04-01 DIAGNOSIS — F209 Schizophrenia, unspecified: Secondary | ICD-10-CM | POA: Insufficient documentation

## 2014-04-01 LAB — RAPID URINE DRUG SCREEN, HOSP PERFORMED
Amphetamines: NOT DETECTED
Barbiturates: NOT DETECTED
Benzodiazepines: NOT DETECTED
Cocaine: POSITIVE — AB
Opiates: NOT DETECTED
Tetrahydrocannabinol: NOT DETECTED

## 2014-04-01 LAB — COMPREHENSIVE METABOLIC PANEL
ALK PHOS: 88 U/L (ref 39–117)
ALT: 13 U/L (ref 0–53)
ANION GAP: 13 (ref 5–15)
AST: 20 U/L (ref 0–37)
Albumin: 4.3 g/dL (ref 3.5–5.2)
BILIRUBIN TOTAL: 0.5 mg/dL (ref 0.3–1.2)
BUN: 19 mg/dL (ref 6–23)
CHLORIDE: 101 meq/L (ref 96–112)
CO2: 27 meq/L (ref 19–32)
Calcium: 10.1 mg/dL (ref 8.4–10.5)
Creatinine, Ser: 0.88 mg/dL (ref 0.50–1.35)
GLUCOSE: 100 mg/dL — AB (ref 70–99)
POTASSIUM: 3.7 meq/L (ref 3.7–5.3)
Sodium: 141 mEq/L (ref 137–147)
Total Protein: 8 g/dL (ref 6.0–8.3)

## 2014-04-01 LAB — CBC WITH DIFFERENTIAL/PLATELET
Basophils Absolute: 0 10*3/uL (ref 0.0–0.1)
Basophils Relative: 0 % (ref 0–1)
Eosinophils Absolute: 0 10*3/uL (ref 0.0–0.7)
Eosinophils Relative: 0 % (ref 0–5)
HCT: 36.4 % — ABNORMAL LOW (ref 39.0–52.0)
HEMOGLOBIN: 11.7 g/dL — AB (ref 13.0–17.0)
LYMPHS ABS: 0.9 10*3/uL (ref 0.7–4.0)
Lymphocytes Relative: 12 % (ref 12–46)
MCH: 29.1 pg (ref 26.0–34.0)
MCHC: 32.1 g/dL (ref 30.0–36.0)
MCV: 90.5 fL (ref 78.0–100.0)
MONOS PCT: 5 % (ref 3–12)
Monocytes Absolute: 0.3 10*3/uL (ref 0.1–1.0)
NEUTROS PCT: 83 % — AB (ref 43–77)
Neutro Abs: 5.7 10*3/uL (ref 1.7–7.7)
Platelets: 237 10*3/uL (ref 150–400)
RBC: 4.02 MIL/uL — AB (ref 4.22–5.81)
RDW: 13.9 % (ref 11.5–15.5)
WBC: 6.9 10*3/uL (ref 4.0–10.5)

## 2014-04-01 LAB — PRO B NATRIURETIC PEPTIDE: Pro B Natriuretic peptide (BNP): 585.7 pg/mL — ABNORMAL HIGH (ref 0–125)

## 2014-04-01 LAB — ETHANOL

## 2014-04-01 LAB — CBG MONITORING, ED: Glucose-Capillary: 96 mg/dL (ref 70–99)

## 2014-04-01 MED ORDER — CEPHALEXIN 500 MG PO CAPS: 500.0000 mg | ORAL_CAPSULE | Freq: Two times a day (BID) | ORAL | Status: DC

## 2014-04-01 MED ORDER — ACETAMINOPHEN 325 MG PO TABS
650.0000 mg | ORAL_TABLET | Freq: Once | ORAL | Status: AC
  Administered 2014-04-01: 650 mg via ORAL
  Filled 2014-04-01: qty 2

## 2014-04-01 MED ORDER — ARIPIPRAZOLE 5 MG PO TABS: 5.0000 mg | ORAL_TABLET | Freq: Every day | ORAL | Status: DC

## 2014-04-01 MED ORDER — BENZTROPINE MESYLATE 2 MG PO TABS: 2.0000 mg | ORAL_TABLET | Freq: Two times a day (BID) | ORAL | Status: DC

## 2014-04-01 MED ORDER — FUROSEMIDE 40 MG PO TABS
40.0000 mg | ORAL_TABLET | Freq: Once | ORAL | Status: AC
  Administered 2014-04-01: 40 mg via ORAL
  Filled 2014-04-01: qty 1

## 2014-04-01 NOTE — ED Notes (Signed)
Pt given bus pass ?

## 2014-04-01 NOTE — Discharge Instructions (Signed)
Keflex as prescribed.  Return to the ER for worsening pain, high fevers, or other new and concerning symptoms.   Blisters Blisters are fluid-filled sacs that form within the skin. Common causes of blistering are friction, burns, and exposure to irritating chemicals. The fluid in the blister protects the underlying damaged skin. Most of the time it is not recommended that you open blisters. When a blister is opened, there is an increased chance for infection. Usually, a blister will open on its own. They then dry up and peel off within 10 days. If the blister is tense and uncomfortable (painful) the fluid may be drained. If it is drained the roof of the blister should be left intact. The draining should only be done by a medical professional under aseptic conditions. Poorly fitting shoes and boots can cause blisters by being too tight or too loose. Wearing extra socks or using tape, bandages, or pads over the blister-prone area helps prevent the problem by reducing friction. Blisters heal more slowly if you have diabetes or if you have problems with your circulation. You need to be careful about medical follow-up to prevent infection. HOME CARE INSTRUCTIONS  Protect areas where blisters have formed until the skin is healed. Use a special bandage with a hole cut in the middle around the blister. This reduces pressure and friction. When the blister breaks, trim off the loose skin and keep the area clean by washing it with soap daily. Soaking the blister or broken-open blister with diluted vinegar twice daily for 15 minutes will dry it up and speed the healing. Use 3 tablespoons of white vinegar per quart of water (45 mL white vinegar per liter of water). An antibiotic ointment and a bandage can be used to cover the area after soaking.  SEEK MEDICAL CARE IF:   You develop increased redness, pain, swelling, or drainage in the blistered area.  You develop a pus-like discharge from the blistered area, chills,  or a fever. MAKE SURE YOU:   Understand these instructions.  Will watch your condition.  Will get help right away if you are not doing well or get worse. Document Released: 07/25/2004 Document Revised: 09/09/2011 Document Reviewed: 06/22/2008 [facility] Patient Information 2015 [place]. This information is not intended to replace advice given to you by your health care provider. Make sure you discuss any questions you have with your health care provider.

## 2014-04-01 NOTE — ED Notes (Signed)
Pt is a homeless individual who is wanting to be seen for the sores on the bottom of is left and right foot.  Pt also stating he lost his prescription for antibiotics from last ED visit and need a prescription written as well as a prescription for Abilify and Cogentin.

## 2014-04-01 NOTE — ED Provider Notes (Signed)
CSN: [id number]     Arrival date & time 04/01/14  [id number] History   First MD Initiated Contact with Patient 04/01/14 [phone]     Chief Complaint  Patient presents with  . Foot Pain     (Consider location/radiation/quality/duration/timing/severity/associated sxs/prior Treatment) HPI Comments: Patient is a 53 year old homeless male with history of diabetes, schizophrenia, cocaine abuse. He presents with multiple complaints. He has blisters on his feet. He was recently seen for this and give antibiotic which he states he lost. There is also complaining of cramping and churning in his stomach. He denies any vomiting or diarrhea.  Patient is a 53 y.o. male presenting with lower extremity pain. The history is provided by the patient.  Foot Pain This is a chronic problem. The problem occurs constantly. The problem has not changed since onset.Nothing aggravates the symptoms. Nothing relieves the symptoms.    Past Medical History  Diagnosis Date  . Diabetes mellitus without complication   . Hypertension   . Schizophrenia   . CHF (congestive heart failure)   . Neuropathy    History reviewed. No pertinent past surgical history. Family History  Problem Relation Age of Onset  . Hypertension Other   . Diabetes Other    History  Substance Use Topics  . Smoking status: Current Every Day Smoker -- 1.00 packs/day    Types: Cigarettes  . Smokeless tobacco: Current User  . Alcohol Use: Yes     Comment: 40oz on 04/01/14    Review of Systems  All other systems reviewed and are negative.     Allergies  Haldol  Home Medications   Prior to Admission medications   Medication Sig Start Date End Date Taking? Authorizing Provider  acetaminophen (TYLENOL) 500 MG tablet Take 1 tablet (500 mg total) by mouth every 6 (six) hours as needed for mild pain or moderate pain. 03/24/14   Junius FinnerErin O'Malley, PA-C  ARIPiprazole (ABILIFY) 5 MG tablet Take 1 tablet (5 mg total) by mouth 2 (two) times daily after a  meal. 03/07/14   Nanine MeansJamison Lord, NP  ARIPiprazole (ABILIFY) 5 MG tablet Take 1 tablet (5 mg total) by mouth 2 (two) times daily. 03/25/14   Shon Batonourtney F Horton, MD  benztropine (COGENTIN) 0.5 MG tablet Take 1 tablet (0.5 mg total) by mouth 2 (two) times daily. 03/25/14   Shon Batonourtney F Horton, MD  cloNIDine (CATAPRES) 0.2 MG tablet Take 1 tablet (0.2 mg total) by mouth 2 (two) times daily. 03/07/14   Nanine MeansJamison Lord, NP  gabapentin (NEURONTIN) 100 MG capsule Take 2 capsules (200 mg total) by mouth 2 (two) times daily. 03/07/14   Nanine MeansJamison Lord, NP  levofloxacin (LEVAQUIN) 750 MG tablet Take 1 tablet (750 mg total) by mouth daily. 03/24/14   Junius FinnerErin O'Malley, PA-C  lisinopril (PRINIVIL,ZESTRIL) 20 MG tablet Take 1 tablet (20 mg total) by mouth daily. For high blood pressure. 03/07/14   Nanine MeansJamison Lord, NP  metFORMIN (GLUCOPHAGE) 500 MG tablet Take 1 tablet (500 mg total) by mouth 2 (two) times daily with a meal. 03/07/14   Nanine MeansJamison Lord, NP  phenazopyridine (PYRIDIUM) 200 MG tablet Take 1 tablet (200 mg total) by mouth 3 (three) times daily. 03/24/14   Junius FinnerErin O'Malley, PA-C  traZODone (DESYREL) 100 MG tablet Take 1 tablet (100 mg total) by mouth at bedtime. 03/07/14   Nanine MeansJamison Lord, NP  trihexyphenidyl (ARTANE) 5 MG tablet Take 1 tablet (5 mg total) by mouth 2 (two) times daily with a meal. 03/07/14   Nanine MeansJamison Lord, NP   BP  179/100  Pulse 87  Temp(Src) 98.1 F (36.7 C) (Oral)  Resp 22  Ht 5\' 8"  (1.727 m)  Wt 167 lb (75.751 kg)  BMI 25.40 kg/m2  SpO2 100% Physical Exam  Nursing note and vitals reviewed. Constitutional: He is oriented to person, place, and time. He appears well-developed and well-nourished.  Patient is a 53 year old male who is awake and alert. He is somewhat agitated and speech is pressured.  HENT:  Head: Normocephalic and atraumatic.  Mouth/Throat: Oropharynx is clear and moist.  Neck: Normal range of motion. Neck supple.  Cardiovascular: Normal rate, regular rhythm and normal heart sounds.   No murmur  heard. Pulmonary/Chest: Effort normal and breath sounds normal. No respiratory distress. He has no wheezes.  Abdominal: Soft. Bowel sounds are normal. He exhibits no distension. There is no tenderness. There is no rebound and no guarding.  Musculoskeletal: Normal range of motion. He exhibits no edema.  Neurological: He is alert and oriented to person, place, and time.    ED Course  Procedures (including critical care time) Labs Review Labs Reviewed - No data to display  Imaging Review No results found.   EKG Interpretation None      MDM   Final diagnoses:  None    Patient is a 53 year old homeless male with history of hypertension, diabetes, and schizophrenia. He presents for evaluation of multiple complaints, none of which appears emergent. He has blisters on his feet that appears somewhat reddened. He was given a prescription for Keflex for this, however lost it. I will refill this for him. He is also complaining of some abdominal distention. His abdomen is benign and KUB reveals retained stool. I doubt there is an emergent intra-abdominal process either. He would also like to have his Abilify and Cogentin refilled. I will write him a limited quantity of these as well.    Geoffery Lyons, MD 04/01/14 [phone]

## 2014-04-01 NOTE — ED Notes (Signed)
Security has wanded pt and pt's belongings.

## 2014-04-01 NOTE — ED Provider Notes (Signed)
CSN: [id number]     Arrival date & time 04/01/14  2117 History   First MD Initiated Contact with Patient 04/01/14 2134     Chief Complaint  Patient presents with  . Suicidal     (Consider location/radiation/quality/duration/timing/severity/associated sxs/prior Treatment) HPI 53 y.o. male with a history of paranoid schizophrenia presents today [facility] with suicidal ideations with plan to walk out in front of traffic.  He states that this is a result of his lack of friends and being homeless. He also endorses use of alcohol and crack cocaine. He states the last time used was last night. He arrived in the company of police [facility], however they refused to treat him there due to his baseline history of CHF and sleep apnea.  Patient denies homicidal ideations. He does have hallucinations which he describes as gorillas attempting to kill him.  He has no complaints of "boils" on his feet as well as penis pain. Pressure exacerbates with the symptoms. Timing is constant. Past Medical History  Diagnosis Date  . Diabetes mellitus without complication   . Hypertension   . Schizophrenia   . CHF (congestive heart failure)   . Neuropathy    History reviewed. No pertinent past surgical history. Family History  Problem Relation Age of Onset  . Hypertension Other   . Diabetes Other    History  Substance Use Topics  . Smoking status: Current Every Day Smoker -- 1.00 packs/day    Types: Cigarettes  . Smokeless tobacco: Current User  . Alcohol Use: Yes     Comment: 40oz on 04/01/14    Review of Systems  Constitutional: Negative for fever and chills.  HENT: Negative for congestion, rhinorrhea and sore throat.   Eyes: Negative for photophobia and visual disturbance.  Respiratory: Negative for cough and shortness of breath.   Cardiovascular: Negative for chest pain and leg swelling.  Gastrointestinal: Negative for nausea, vomiting, abdominal pain, diarrhea and constipation.   Endocrine: Negative for polydipsia and polyuria.  Genitourinary: Negative for dysuria and hematuria.  Musculoskeletal: Negative for arthralgias and back pain.  Skin: Negative for color change and rash.  Neurological: Negative for dizziness, syncope, light-headedness and headaches.  Hematological: Negative for adenopathy. Does not bruise/bleed easily.  All other systems reviewed and are negative.     Allergies  Haldol  Home Medications   Prior to Admission medications   Medication Sig Start Date End Date Taking? Authorizing Provider  benztropine (COGENTIN) 2 MG tablet Take 1 tablet (2 mg total) by mouth 2 (two) times daily. 04/01/14  Yes Geoffery Lyonsouglas Delo, MD  ARIPiprazole (ABILIFY) 5 MG tablet Take 5 mg by mouth daily.     Historical Provider, MD  cephALEXin (KEFLEX) 500 MG capsule Take 1 capsule (500 mg total) by mouth 2 (two) times daily. 04/01/14   Geoffery Lyonsouglas Delo, MD   BP 152/88  Pulse 93  Temp(Src) 98.9 F (37.2 C) (Oral)  Resp 18  SpO2 100% Physical Exam  Vitals reviewed. Constitutional: He is oriented to person, place, and time. He appears well-developed and well-nourished.  HENT:  Head: Normocephalic and atraumatic.  Eyes: Conjunctivae and EOM are normal.  Neck: Normal range of motion. Neck supple.  Cardiovascular: Normal rate, regular rhythm and normal heart sounds.   Pulmonary/Chest: Effort normal and breath sounds normal. No respiratory distress.  Abdominal: He exhibits no distension. There is no tenderness. There is no rebound and no guarding.  Musculoskeletal: Normal range of motion.  Serous blisters on bil  plantar surfaces of feet  Neurological: He is alert and oriented to person, place, and time.  Skin: Skin is warm and dry.    ED Course  Procedures (including critical care time) Labs Review Labs Reviewed  CBC WITH DIFFERENTIAL - Abnormal; Notable for the following:    RBC 4.02 (*)    Hemoglobin 11.7 (*)    HCT 36.4 (*)    Neutrophils Relative % 83 (*)     All other components within normal limits  COMPREHENSIVE METABOLIC PANEL - Abnormal; Notable for the following:    Glucose, Bld 100 (*)    All other components within normal limits  URINE RAPID DRUG SCREEN (HOSP PERFORMED) - Abnormal; Notable for the following:    Cocaine POSITIVE (*)    All other components within normal limits  PRO B NATRIURETIC PEPTIDE - Abnormal; Notable for the following:    Pro B Natriuretic peptide (BNP) 585.7 (*)    All other components within normal limits  ETHANOL  CBG MONITORING, ED    Imaging Review Dg Chest 2 View  04/01/2014   CLINICAL DATA:  Initial encounter are, shortness of breath and chest pain.  EXAM: CHEST  2 VIEW  COMPARISON:  12/16/2013.  FINDINGS: Trachea is midline. Heart size normal. Lungs are clear. No pleural fluid. A metallic density projects inferior to the sternum, as on the prior exam.  IMPRESSION: No acute findings.   Electronically Signed   By: Leanna Battles M.D.   On: 04/01/2014 22:58   Dg Abd 1 View  04/01/2014   CLINICAL DATA:  Stomach pain  EXAM: ABDOMEN - 1 VIEW  COMPARISON:  03/23/2014  FINDINGS: Gas and stool within the colon with prominent stool in the right colon. No small or large bowel distention. No radiopaque stones. Degenerative changes in the spine. Vascular calcifications.  IMPRESSION: Stool-filled colon.  Nonobstructive bowel gas pattern.   Electronically Signed   By: Burman Nieves M.D.   On: 04/01/2014 05:15     EKG Interpretation None      MDM   Final diagnoses:  Schizophrenia, unspecified type  Cocaine abuse with cocaine-induced mood disorder    53 y.o. male with pertinent PMH of paranoid schizophrenia, chf, sleep apnea presents with suicidal ideation from [facility].  His admission was refused there due to his medical history. He denies increase in either of these conditions.  He does perseverate on blistering of bilateral feet on penis pain, however only request Tylenol. He's been evaluated numerous times for  both of these complaints.  As in prior visits, there are no physical exam findings to support acute illness of these areas. I suspect that these are likely manifestations of his paranoid schizophrenia.  Lab work and imaging obtained as above.    Mild elevation in BNP, however pt without signs of chf exacerbation at this time, no edema and no rales, and has no complaint of dyspnea or other findings.  Feel he is medically clear for psych evaluation.  Consulted TTS.    1. Schizophrenia, unspecified type   2. Cocaine abuse with cocaine-induced mood disorder         Mirian Mo, MD 04/02/14 2158

## 2014-04-01 NOTE — ED Notes (Signed)
Patient transported to X-ray 

## 2014-04-01 NOTE — ED Notes (Signed)
Pt walked out of room, demanding prescriptions, tylenol and footies, shouting at nurses. Pt then walked down the hallway, and urinated on the floor. Pt taken to waiting room in a wheel chair. Unable to obtain a last set of vitals.

## 2014-04-01 NOTE — ED Notes (Signed)
Pt is alert and oriented in NAD,  Pt admits to SI but denies HI.

## 2014-04-01 NOTE — ED Notes (Signed)
Per EMS [facility] and they state that he cannot stay there as he does not meet their criteria  Pt states he is suicidal  Pt is IVC by police   Pt was [facility] earlier today for [facility]

## 2014-04-01 NOTE — ED Notes (Signed)
Warm blankets provided.

## 2014-04-02 MED ORDER — ARIPIPRAZOLE 5 MG PO TABS
5.0000 mg | ORAL_TABLET | Freq: Every day | ORAL | Status: DC
  Filled 2014-04-02: qty 1

## 2014-04-02 MED ORDER — ACETAMINOPHEN 325 MG PO TABS
650.0000 mg | ORAL_TABLET | ORAL | Status: DC | PRN
  Administered 2014-04-02: 650 mg via ORAL
  Filled 2014-04-02: qty 2

## 2014-04-02 MED ORDER — ACETAMINOPHEN 325 MG PO TABS
650.0000 mg | ORAL_TABLET | Freq: Once | ORAL | Status: AC
  Administered 2014-04-02: 650 mg via ORAL
  Filled 2014-04-02: qty 2

## 2014-04-02 NOTE — Discharge Instructions (Signed)
Take your medications as prescribed.  For mental health issues and/or crisis, go directly to [facility]. For medical care, follow up with primary care doctor. Keep feet clean and dry.  Avoid pressure on blisters of foot.  Avoid cocaine use, use community resources provided. Return to ER if worse, new symptoms, fevers, medical emergency, other concern.      Stimulant Use Disorder-Cocaine Cocaine is one of a group of powerful drugs called stimulants. Cocaine has medical uses for stopping nosebleeds and for pain control before minor nose or dental surgery. However, cocaine is misused because of the effects that it produces. These effects include:   A feeling of extreme pleasure.  Alertness.  High energy. Common street names for cocaine include coke, crack, blow, snow, and nose candy. Cocaine is snorted, dissolved in water and injected, or smoked.  Stimulants are addictive because they activate regions of the brain that produce both the pleasurable sensation of "reward" and psychological dependence. Together, these actions account for loss of control and the rapid development of drug dependence. This means you become ill without the drug (withdrawal) and need to keep using it to function.  Stimulant use disorder is use of stimulants that disrupts your daily life. It disrupts relationships with family and friends and how you do your job. Cocaine increases your blood pressure and heart rate. It can cause a heart attack or stroke. Cocaine can also cause death from irregular heart rate or seizures. SYMPTOMS Symptoms of stimulant use disorder with cocaine include:  Use of cocaine in larger amounts or over a longer period of time than intended.  Unsuccessful attempts to cut down or control cocaine use.  A lot of time spent obtaining, using, or recovering from the effects of cocaine.  A strong desire or urge to use cocaine (craving).  Continued use of cocaine in spite of major problems at work,  school, or home because of use.  Continued use of cocaine in spite of relationship problems because of use.  Giving up or cutting down on important life activities because of cocaine use.  Use of cocaine over and over in situations when it is physically hazardous, such as driving a car.  Continued use of cocaine in spite of a physical problem that is likely related to use. Physical problems can include:  Malnutrition.  Nosebleeds.  Chest pain.  High blood pressure.  A hole that develops between the part of your nose that separates your nostrils (perforated nasal septum).  Lung and kidney damage.  Continued use of cocaine in spite of a mental problem that is likely related to use. Mental problems can include:  Schizophrenia-like symptoms.  Depression.  Bipolar mood swings.  Anxiety.  Sleep problems.  Need to use more and more cocaine to get the same effect, or lessened effect over time with use of the same amount of cocaine (tolerance).  Having withdrawal symptoms when cocaine use is stopped, or using cocaine to reduce or avoid withdrawal symptoms. Withdrawal symptoms include:  Depressed or irritable mood.  Low energy or restlessness.  Bad dreams.  Poor or excessive sleep.  Increased appetite. DIAGNOSIS Stimulant use disorder is diagnosed by your health care provider. You may be asked questions about your cocaine use and how it affects your life. A physical exam may be done. A drug screen may be ordered. You may be referred to a mental health professional. The diagnosis of stimulant use disorder requires at least two symptoms within 12 months. The type of stimulant use disorder  depends on the number of signs and symptoms you have. The type may be:  Mild. Two or three signs and symptoms.  Moderate. Four or five signs and symptoms.  Severe. Six or more signs and symptoms. TREATMENT Treatment for stimulant use disorder is usually provided by mental health  professionals with training in substance use disorders. The following options are available:  Counseling or talk therapy. Talk therapy addresses the reasons you use cocaine and ways to keep you from using again. Goals of talk therapy include:  Identifying and avoiding triggers for use.  Handling cravings.  Replacing use with healthy activities.  Support groups. Support groups provide emotional support, advice, and guidance.  Medicine. Certain medicines may decrease cocaine cravings or withdrawal symptoms. HOME CARE INSTRUCTIONS  Take medicines only as directed by your health care provider.  Identify the people and activities that trigger your cocaine use and avoid them.  Keep all follow-up visits as directed by your health care provider. SEEK MEDICAL CARE IF:  Your symptoms get worse or you relapse.  You are not able to take medicines as directed. SEEK IMMEDIATE MEDICAL CARE IF:  You have serious thoughts about hurting yourself or others.  You have a seizure, chest pain, sudden weakness, or loss of speech or vision. FOR MORE INFORMATION  National Institute on Drug Abuse: http://www.price-smith.com/  Substance Abuse and Mental Health Services Administration: SkateOasis.com.pt Document Released: 06/14/2000 Document Revised: 11/01/2013 Document Reviewed: 06/30/2013 [facility] Patient Information 2015 [place], [place]. This information is not intended to replace advice given to you by your health care provider. Make sure you discuss any questions you have with your health care provider.     Schizophrenia Schizophrenia is a mental illness. It may cause disturbed or disorganized thinking, speech, or behavior. People with schizophrenia have problems functioning in one or more areas of life: work, school, home, or relationships. People with schizophrenia are at increased risk for suicide, certain chronic physical illnesses, and unhealthy behaviors, such as smoking and drug use. People who have  family members with schizophrenia are at higher risk of developing the illness. Schizophrenia affects men and women equally but usually appears at an earlier age (teenage or early adult years) in men.  SYMPTOMS The earliest symptoms are often subtle (prodrome) and may go unnoticed until the illness becomes more severe (first-break psychosis). Symptoms of schizophrenia may be continuous or may come and go in severity. Episodes often are triggered by major life events, such as family stress, college, [employer], marriage, pregnancy or child birth, divorce, or loss of a loved one. People with schizophrenia may see, hear, or feel things that do not exist (hallucinations). They may have false beliefs in spite of obvious proof to the contrary (delusions). Sometimes speech is incoherent or behavior is odd or withdrawn.  DIAGNOSIS Schizophrenia is diagnosed through an assessment by your caregiver. Your caregiver will ask questions about your thoughts, behavior, mood, and ability to function in daily life. Your caregiver may ask questions about your medical history and use of alcohol or drugs, including prescription medication. Your caregiver may also order blood tests and imaging exams. Certain medical conditions and substances can cause symptoms that resemble schizophrenia. Your caregiver may refer you to a mental health specialist for evaluation. There are three major criterion for a diagnosis of schizophrenia:  Two or more of the following five symptoms are present for a month or longer:  Delusions. Often the delusions are that you are being attacked, harassed, cheated, persecuted or conspired against (persecutory  delusions).  Hallucinations.   Disorganized speech that does not make sense to others.  Grossly disorganized (confused or unfocused) behavior or extremely overactive or underactive motor activity (catatonia).  Negative symptoms such as bland or blunted emotions (flat affect), loss of will  power (avolition), and withdrawal from social contacts (social isolation).  Level of functioning in one or more major areas of life (work, school, relationships, or self-care) is markedly below the level of functioning before the onset of illness.   There are continuous signs of illness (either mild symptoms or decreased level of functioning) for at least 6 months or longer. TREATMENT  Schizophrenia is a long-term illness. It is best controlled with continuous treatment rather than treatment only when symptoms occur. The following treatments are used to manage schizophrenia:  Medication--Medication is the most effective and important form of treatment for schizophrenia. Antipsychotic medications are usually prescribed to help manage schizophrenia. Other types of medication may be added to relieve any symptoms that may occur despite the use of antipsychotic medications.  Counseling or talk therapy--Individual, group, or family counseling may be helpful in providing education, support, and guidance. Many people with schizophrenia also benefit from social skills and job skills (vocational) training. A combination of medication and counseling is best for managing the disorder over time. A procedure in which electricity is applied to the brain through the scalp (electroconvulsive therapy) may be used to treat catatonic schizophrenia or schizophrenia in people who cannot take or do not respond to medication and counseling. Document Released: 06/14/2000 Document Revised: 02/17/2013 Document Reviewed: 09/09/2012 [facility] Patient Information 2015 [place], [place]. This information is not intended to replace advice given to you by your health care provider. Make sure you discuss any questions you have with your health care provider.    Blisters Blisters are fluid-filled sacs that form within the skin. Common causes of blistering are friction, burns, and exposure to irritating chemicals. The fluid in the  blister protects the underlying damaged skin. Most of the time it is not recommended that you open blisters. When a blister is opened, there is an increased chance for infection. Usually, a blister will open on its own. They then dry up and peel off within 10 days. If the blister is tense and uncomfortable (painful) the fluid may be drained. If it is drained the roof of the blister should be left intact. The draining should only be done by a medical professional under aseptic conditions. Poorly fitting shoes and boots can cause blisters by being too tight or too loose. Wearing extra socks or using tape, bandages, or pads over the blister-prone area helps prevent the problem by reducing friction. Blisters heal more slowly if you have diabetes or if you have problems with your circulation. You need to be careful about medical follow-up to prevent infection. HOME CARE INSTRUCTIONS  Protect areas where blisters have formed until the skin is healed. Use a special bandage with a hole cut in the middle around the blister. This reduces pressure and friction. When the blister breaks, trim off the loose skin and keep the area clean by washing it with soap daily. Soaking the blister or broken-open blister with diluted vinegar twice daily for 15 minutes will dry it up and speed the healing. Use 3 tablespoons of white vinegar per quart of water (45 mL white vinegar per liter of water). An antibiotic ointment and a bandage can be used to cover the area after soaking.  SEEK MEDICAL CARE IF:   You develop  increased redness, pain, swelling, or drainage in the blistered area.  You develop a pus-like discharge from the blistered area, chills, or a fever. MAKE SURE YOU:   Understand these instructions.  Will watch your condition.  Will get help right away if you are not doing well or get worse. Document Released: 07/25/2004 Document Revised: 09/09/2011 Document Reviewed: 06/22/2008 [facility] Patient Information 2015  [place], [place]. This information is not intended to replace advice given to you by your health care provider. Make sure you discuss any questions you have with your health care provider.      Emergency Department Resource Guide 1) Find a Doctor and Pay Out of Pocket Although you won't have to find out who is covered by your insurance plan, it is a good idea to ask around and get recommendations. You will then need to call the office and see if the doctor you have chosen will accept you as a new patient and what types of options they offer for patients who are self-pay. Some doctors offer discounts or will set up payment plans for their patients who do not have insurance, but you will need to ask so you aren't surprised when you get to your appointment.  2) Contact Your Local Health Department Not all health departments have doctors that can see patients for sick visits, but many do, so it is worth a call to see if yours does. If you don't know where your local health department is, you can check in your phone book. The CDC also has a tool to help you locate your state's health department, and many state websites also have listings of all of their local health departments.  3) Find a Walk-in Clinic If your illness is not likely to be very severe or complicated, you may want to try a walk in clinic. These are popping up all over the country in pharmacies, drugstores, and shopping centers. They're usually staffed by nurse practitioners or physician assistants that have been trained to treat common illnesses and complaints. They're usually fairly quick and inexpensive. However, if you have serious medical issues or chronic medical problems, these are probably not your best option.  No Primary Care Doctor: - Call Health Connect at  [phone] - they can help you locate a primary care doctor that  accepts your insurance, provides certain services, etc. - Physician Referral Service- [phone]  Chronic  Pain Problems: Organization         Address  Phone   Notes  [facility]  [phone] Patients need to be referred by their primary care doctor.   Medication Assistance: Organization         Address  Phone   Notes  [facility] [address] [place], [place] [address] [phone] --Must be a resident of [facility] [place] -- Must have NO insurance coverage whatsoever (no Medicaid/ Medicare, etc.) -- The pt. MUST have a primary care doctor that directs their care regularly and follows them in the community   MedAssist  [phone]   [employer]  [phone]    Agencies that provide inexpensive medical care: Organization         Address  Phone   Notes  [facility]  [phone]   [facility]    [phone]   [facility] [address] [place], [place] [address] [phone]   [facility] [address]  [address] [place] [phone]   [facility]anned Parenthood    [phone]   [facility]    [phone]   [facility]  [address], [place] Phone:  [phone], Fax:  [phone] Hours of Operation:  9 am - 6 pm, M-F.  Also accepts Medicaid/Medicare and self-pay.  [facility]  [address], [place] Phone: [phone], Fax: [phone]. Hours of Operation:  8:30 am - 5:30 pm, M-F.  Also accepts Medicaid and self-pay.  [facility] [place] [address], [place] Phone: [phone]   [facility] [address] Natasha Bence [place], [place] [phone], Ext. [phone] Mondays & Thursdays: 7-9 AM.  First 15 patients are seen on a first come, first serve basis.    Medicaid-accepting [facility] Providers:  Organization         Address  Phone   Notes  [facility] [address], [place] [phone] Also  accepts self-pay patients.  [facility] [address] Laurell Josephs [place], [place]  [phone]   [facility] [address], [place] [phone]   [facility] [address] [place] [phone]   Renaye Rakers [address], [place]   [phone] Only accepts [place] Access [place] patients after they have their name applied to their card.   Self-Pay (no insurance) in [facility]:  Organization         Address  Phone   Notes  Sickle Cell Patients, [facility] [address] [place], [place] [phone]   [facility] [address] [place], [place] [phone]   [facility]  [address], [place] [phone]   [facility]/Dr. Osei-Bonsu  [address], [place] or [address], [place] [phone] Phone number for both West and [place] locations is the same.  [facility] [address] [place] [phone]   [facility] [address] [place] or [address] [phone]   [facility] [address] [place] [phone], phone; [phone], fax Sees patients 1st and 3rd Saturday of every month.  Must not qualify for public or private insurance (i.e. Medicaid, Medicare, [name] Health Choice, Veterans' Benefits)  Household income should be no more than 200% of the poverty level The clinic cannot treat you if you are pregnant or think you are pregnant  Sexually transmitted diseases are not treated at the clinic.    Dental Care: Organization         Address  Phone  Notes  [facility] [address] [place], [place] [phone] Accepts children up to age 71 who are enrolled in [place] or [place] Health Choice; pregnant  women with a Medicaid card; and children who have applied for Medicaid or [place] Health Choice, but were declined, whose parents can pay a reduced fee at time of service.  [facility]  [address], [place] [phone] Accepts children up to age 89 who are enrolled in [place] or [place] Health Choice; pregnant women with a Medicaid card; and children who have applied for Medicaid  or [place] Health Choice, but were declined, whose parents can pay a reduced fee at time of service.  [facility]  [address] [place], [place] [phone] Patients are seen by appointment only. Walk-ins are not accepted. [facility] will see patients 73 years of age and older. Monday - Tuesday (8am-5pm) Most Wednesdays (8:30-5pm) $30 per visit, cash only  [facility] Access [facility]  [address] [place] [address], [facility] [phone] Patients are seen by appointment only. Walk-ins are not accepted. [facility] will see patients 2 years of age and older. One Wednesday Evening (Monthly: Volunteer Based).  $30 per visit, cash only  [employer]  [phone] for adults; Children under age 71, call Graduate Pediatric Dentistry at [phone]. Children aged 52-14, please call [phone] to request a pediatric application.  Dental services are provided in all areas of dental care including fillings, crowns and bridges, complete and partial dentures, implants, gum treatment, root canals, and extractions. Preventive care is also provided. Treatment is provided to both adults and children. Patients are selected via a lottery and there is often a waiting list.   [facility] [address], [place]  [phone] [email or web address]   [facility] [address] [place], [place] [phone], Ext. [phone] Second and Fourth Thursday of each month, opens at 6:30 AM; Clinic ends at 9 AM.  Patients are  seen on a first-come first-served basis, and a limited number are seen during each clinic.   [facility]  [address] Ether Griffins [place], [place] [phone]   Eligibility Requirements You must have lived in [place], [place], or [place] counties for at least the last three months.   You cannot be eligible for state or federal sponsored [place], including [employer], [place], or [employer].   You generally cannot be eligible for healthcare insurance through your employer.    How to apply: Eligibility screenings are held every Tuesday and Wednesday afternoon from 1:00 pm until 4:00 pm. You do not need an appointment for the interview!  [facility] [address] [place], [place] [phone]   [facility] Health Department  [phone]   [facility] Health Department  [phone]   [facility] Health Department  [phone]    Behavioral Health Resources in the Community: Intensive Outpatient Programs Organization         Address  Phone  Notes  [facility] [address], [place], [place] [phone]   [facility] [address] [place], [place] [phone]   [facility] [address], [place], [place]  [phone]   [facility] [address]  [place], [place] [phone] or [phone]   Substance Abuse Resources Organization         Address  Phone  Notes  [facility]  [phone]   [facility]  [phone]   [facility] [place]  [phone]   Floydene Flock  [phone]   Residential & Outpatient Substance Abuse Program  [phone]   Psychological Services Organization         Address  Phone  Notes  [facility]  [phone]   [facility] Services  [phone]   [facility] [address] [place] [phone] or [phone]    Mobile Crisis  Teams Organization         Address  Phone  Notes  [facility], Mobile Crisis Care Unit  [phone]   [facility]  [address] [place], [place] [phone]   [facility] [address] [place] [phone]    Self-Help/Support Groups Organization         Address  Phone             Notes  [facility] [place] - variety of support groups  [phone] Call for more information  Narcotics Anonymous (NA), [facility] [address], [employer] [place]  2 meetings at this location   Statistician         Address  Phone  Notes  [facility] [address],    [place]  [phone]   [facility]  [address] [place] [address], [place], [place] [phone]   [facility] [address] [place], [place] [phone] Admissions: 8am-3pm M-F  [facility] [address]    [place], [place] [phone]   [facility] [address] [place], [place], [place] [phone]   [facility] [address],  [place], [place] [phone]   [facility] - Intensive Outpatient [address] Laurell Josephs [address], [place], [place] [phone]   [facility] ([facility] [address] [place]  [place], [place] [phone] or [phone]   [facility] ([facility]) [address] [address], [place], [place] [phone] Accepts Medicaid  [facility] [address],  [place] [phone] Substance Abuse/Addiction Treatment   [facility]         Address  Phone  Notes  [facility]  [phone]   Angie Fava, PhD [address] Ervin Knack [place], [place]   [phone] or [phone]   [facility]   [address] [place], [place] [phone]   [facility] [address], [place], [place] [phone]  Insurance/Medicaid/sponsorship through [facility] [address]                                    [place], [place] [phone] Therapy/tele-psych/case  [facility] [address], [place] [phone]    Dr. Lolly Mustache  [phone]   [facility]  [employer] [facility] Dept. 1) [address] [place] 2) [address] [place] 3)  [address], [place] [phone]  [phone]   [facility] Child Abuse Hotline [phone] or [phone] (After Hours)

## 2014-04-02 NOTE — ED Notes (Signed)
Pt is up and ready to take a shower,  [place] [facility]  [place] and [employer] are outside door ,  Pt will return to room and reassessed at that time
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
## 2014-04-02 NOTE — ED Provider Notes (Signed)
Pt states feels much improved today.  Denies depression or any thought of harm to self. No hallucinations. Pt is alert, oriented, and has normal mood and affect.  He denies any thoughts of harm to self or others. He requests d/c to home, stating as long as he has rx for his abilify and cogentin that he will take his medications as prescribed and follow up as outpatient.  He states he was feeling worse when high on cocaine, and that he feels fine today.   Pt was given rx for his meds earlier, is encourage to fill rx as he plans to do, and follow up closely as outpatient.        Suzi RootsKevin E Elleen Coulibaly, MD 04/02/14 [phone]

## 2014-04-02 NOTE — ED Notes (Signed)
He is awake, alert and in no distress.  He is very loquacious and repetitive, and quite animated in his demeanor.  He reports generalized h/a; and I receive order for tylenol, which I give him.  Dr. Doristine CounterPfiffer is notified, and she reorders his Abilify also.  Pt. Also states "I have a prescription for an antibiotic for my penis and my bowels--I wonder if my doctor could order that for me".  Dr. Arizona ConstableStienl has just seen him also.

## 2014-04-03 ENCOUNTER — Emergency Department (HOSPITAL_COMMUNITY): Payer: Medicare Other

## 2014-04-03 ENCOUNTER — Emergency Department (HOSPITAL_COMMUNITY)
Admission: EM | Admit: 2014-04-03 | Discharge: 2014-04-04 | Disposition: A | Discharge status: Home / Self Care | Payer: Medicare Other | Attending: Dermatology | Admitting: Dermatology

## 2014-04-03 ENCOUNTER — Encounter (HOSPITAL_COMMUNITY): Payer: Self-pay | Admitting: Emergency Medicine

## 2014-04-03 DIAGNOSIS — I4891 Unspecified atrial fibrillation: Secondary | ICD-10-CM | POA: Insufficient documentation

## 2014-04-03 DIAGNOSIS — Z8669 Personal history of other diseases of the nervous system and sense organs: Secondary | ICD-10-CM | POA: Diagnosis not present

## 2014-04-03 DIAGNOSIS — I509 Heart failure, unspecified: Secondary | ICD-10-CM | POA: Insufficient documentation

## 2014-04-03 DIAGNOSIS — Z792 Long term (current) use of antibiotics: Secondary | ICD-10-CM | POA: Diagnosis not present

## 2014-04-03 DIAGNOSIS — I10 Essential (primary) hypertension: Secondary | ICD-10-CM | POA: Insufficient documentation

## 2014-04-03 DIAGNOSIS — F209 Schizophrenia, unspecified: Secondary | ICD-10-CM | POA: Diagnosis present

## 2014-04-03 DIAGNOSIS — Z79899 Other long term (current) drug therapy: Secondary | ICD-10-CM | POA: Diagnosis not present

## 2014-04-03 DIAGNOSIS — E119 Type 2 diabetes mellitus without complications: Secondary | ICD-10-CM | POA: Diagnosis not present

## 2014-04-03 DIAGNOSIS — N390 Urinary tract infection, site not specified: Secondary | ICD-10-CM | POA: Diagnosis not present

## 2014-04-03 DIAGNOSIS — F1410 Cocaine abuse, uncomplicated: Secondary | ICD-10-CM | POA: Insufficient documentation

## 2014-04-03 DIAGNOSIS — Z720 Tobacco use: Secondary | ICD-10-CM | POA: Diagnosis not present

## 2014-04-03 LAB — CBC
HEMATOCRIT: 35 % — AB (ref 39.0–52.0)
Hemoglobin: 11.2 g/dL — ABNORMAL LOW (ref 13.0–17.0)
MCH: 29.4 pg (ref 26.0–34.0)
MCHC: 32 g/dL (ref 30.0–36.0)
MCV: 91.9 fL (ref 78.0–100.0)
Platelets: 193 10*3/uL (ref 150–400)
RBC: 3.81 MIL/uL — ABNORMAL LOW (ref 4.22–5.81)
RDW: 14.3 % (ref 11.5–15.5)
WBC: 10.2 10*3/uL (ref 4.0–10.5)

## 2014-04-03 LAB — URINALYSIS, ROUTINE W REFLEX MICROSCOPIC
BILIRUBIN URINE: NEGATIVE
Glucose, UA: 100 mg/dL — AB
Hgb urine dipstick: NEGATIVE
Ketones, ur: NEGATIVE mg/dL
NITRITE: NEGATIVE
PH: 6.5 (ref 5.0–8.0)
Protein, ur: NEGATIVE mg/dL
SPECIFIC GRAVITY, URINE: 1.013 (ref 1.005–1.030)
Urobilinogen, UA: 1 mg/dL (ref 0.0–1.0)

## 2014-04-03 LAB — COMPREHENSIVE METABOLIC PANEL
ALT: 11 U/L (ref 0–53)
ANION GAP: 15 (ref 5–15)
AST: 16 U/L (ref 0–37)
Albumin: 4 g/dL (ref 3.5–5.2)
Alkaline Phosphatase: 80 U/L (ref 39–117)
BILIRUBIN TOTAL: 0.8 mg/dL (ref 0.3–1.2)
BUN: 12 mg/dL (ref 6–23)
CALCIUM: 9.4 mg/dL (ref 8.4–10.5)
CO2: 23 mEq/L (ref 19–32)
Chloride: 101 mEq/L (ref 96–112)
Creatinine, Ser: 0.75 mg/dL (ref 0.50–1.35)
GFR calc Af Amer: >90 mL/min (ref >90)
GFR calc non Af Amer: >90 mL/min (ref >90)
Glucose, Bld: 164 mg/dL — ABNORMAL HIGH (ref 70–99)
Potassium: 3.7 mEq/L (ref 3.7–5.3)
Sodium: 139 mEq/L (ref 137–147)
TOTAL PROTEIN: 7.5 g/dL (ref 6.0–8.3)

## 2014-04-03 LAB — URINE MICROSCOPIC-ADD ON

## 2014-04-03 LAB — RAPID URINE DRUG SCREEN, HOSP PERFORMED
Amphetamines: NOT DETECTED
Barbiturates: NOT DETECTED
Benzodiazepines: NOT DETECTED
Cocaine: POSITIVE — AB
Opiates: NOT DETECTED
Tetrahydrocannabinol: POSITIVE — AB

## 2014-04-03 LAB — TROPONIN I: Troponin I: <0.3 ng/mL (ref <0.30)

## 2014-04-03 LAB — ETHANOL

## 2014-04-03 MED ORDER — ARIPIPRAZOLE 5 MG PO TABS
5.0000 mg | ORAL_TABLET | Freq: Every day | ORAL | Status: DC
  Administered 2014-04-03: 5 mg via ORAL
  Filled 2014-04-03: qty 1

## 2014-04-03 MED ORDER — BENZTROPINE MESYLATE 1 MG PO TABS
2.0000 mg | ORAL_TABLET | Freq: Two times a day (BID) | ORAL | Status: DC
  Administered 2014-04-03 (×2): 2 mg via ORAL
  Filled 2014-04-03 (×4): qty 2

## 2014-04-03 MED ORDER — ACETAMINOPHEN 325 MG PO TABS
650.0000 mg | ORAL_TABLET | ORAL | Status: DC | PRN
  Administered 2014-04-03: 650 mg via ORAL
  Filled 2014-04-03: qty 2

## 2014-04-03 MED ORDER — DILTIAZEM HCL 25 MG/5ML IV SOLN
20.0000 mg | Freq: Once | INTRAVENOUS | Status: DC
  Filled 2014-04-03: qty 5

## 2014-04-03 MED ORDER — BACITRACIN-NEOMYCIN-POLYMYXIN 400-5-5000 EX OINT
1.0000 "application " | TOPICAL_OINTMENT | CUTANEOUS | Status: DC | PRN
  Administered 2014-04-03: 1 via TOPICAL
  Filled 2014-04-03: qty 1

## 2014-04-03 MED ORDER — SODIUM CHLORIDE 0.9 % IV SOLN
INTRAVENOUS | Status: DC
  Administered 2014-04-03: 20 mL/h via INTRAVENOUS

## 2014-04-03 MED ORDER — ACETAMINOPHEN 325 MG PO TABS
650.0000 mg | ORAL_TABLET | Freq: Once | ORAL | Status: AC
  Administered 2014-04-03: 650 mg via ORAL
  Filled 2014-04-03: qty 2

## 2014-04-03 MED ORDER — DILTIAZEM HCL 100 MG IV SOLR
5.0000 mg/h | Freq: Once | INTRAVENOUS | Status: AC
  Administered 2014-04-03: 5 mg/h via INTRAVENOUS
  Filled 2014-04-03: qty 100

## 2014-04-03 MED ORDER — DILTIAZEM LOAD VIA INFUSION
20.0000 mg | Freq: Once | INTRAVENOUS | Status: AC
Start: 2014-04-03 — End: 2014-04-03
  Administered 2014-04-03: 20 mg via INTRAVENOUS
  Filled 2014-04-03: qty 20

## 2014-04-03 MED ORDER — CEPHALEXIN 500 MG PO CAPS
500.0000 mg | ORAL_CAPSULE | Freq: Four times a day (QID) | ORAL | Status: DC
  Administered 2014-04-03: 500 mg via ORAL
  Filled 2014-04-03: qty 1

## 2014-04-03 NOTE — ED Notes (Signed)
Counselor at bedside with patient

## 2014-04-03 NOTE — ED Notes (Signed)
He walked into a local church where churchgoers observed pt. To be acting bizarrely; so they called EMS.  He arrives wtill wearing wine-colored scrubs he had received here yesterday.  He is in no distress. He told EMS he smoked marijuana.

## 2014-04-03 NOTE — BH Assessment (Signed)
Assessment Note  [name] is an 53 y.o. male who presents to [facility] Emergency Department with the chief compliant of bizarre behaviors observed by others at [employer]. Patient was observed to be a poor historian and reported that he is here "to get help with getting off my feet". Patient states that he is homeless and has been soliciting for 20 years to receive help from others. Patient reports he has been using crack cocaine, marijuana, and alcohol but was unable to specify if his use is to cope with depression or for recreational purposes. Patient denies thoughts of harm to himself or others and reports that he has a court date tomorrow for being charged with soliciting and trespassing. Patient reports "I am a mental health person and I need a FL2 and get me to a group home or rest home". Patient states that he was previously at a rest home in the past but then left the rest home for drugs. Patient is alert and oriented x4 and exhibits pressured speech at times during the assessment. Patient reports past inpatient psychiatric [facility] for similar complaints and substance abuse. Patient denies SI/HI/AVH and was [facility] from [facility] yesterday on 04/02/14.   Axis I: Schizophrenia Axis II: Deferred Axis III:  Past Medical History  Diagnosis Date  . Diabetes mellitus without complication   . Hypertension   . Schizophrenia   . CHF (congestive heart failure)   . Neuropathy    Axis IV: economic problems, educational problems, housing problems, occupational problems, other psychosocial or environmental problems, problems related to legal system/crime, problems related to social environment, problems with access to health care services and problems with primary support group Axis V: 61-70 mild symptoms  Past Medical History:  Past Medical History  Diagnosis Date  . Diabetes mellitus without complication   . Hypertension   . Schizophrenia   . CHF  (congestive heart failure)   . Neuropathy     No past surgical history on file.  Family History:  Family History  Problem Relation Age of Onset  . Hypertension Other   . Diabetes Other     Social History:  reports that he has been smoking Cigarettes.  He has been smoking about 1.00 pack per day. He uses smokeless tobacco. He reports that he drinks alcohol. He reports that he uses illicit drugs ("Crack" cocaine, Cocaine, and Marijuana) about 7 times per week.  Additional Social History:  Alcohol / Drug Use History of alcohol / drug use?: Yes Substance #1 Name of Substance 1: ETOH 1 - Age of First Use: 20s 1 - Amount (size/oz): varies 1 - Frequency: varies 1 - Duration: years 1 - Last Use / Amount: 04/03/14-40 oz  Substance #2 Name of Substance 2: Crack cocaine 2 - Age of First Use: unknown 2 - Amount (size/oz): varies 2 - Frequency: daily 2 - Duration: years 2 - Last Use / Amount: 04/02/14- $100 worth  CIWA: CIWA-Ar BP: 137/63 mmHg Pulse Rate: 84 COWS:    Allergies:  Allergies  Allergen Reactions  . Haldol [Haloperidol] Other (See Comments)    Muscle spasms, loss of voluntary movement    Home Medications:  (Not in a hospital admission)  OB/GYN Status:  No LMP for male patient.  General Assessment Data [facility] Is this a Tele or Face-to-Face Assessment?: Face-to-Face Is this an Initial Assessment or a Re-assessment for this encounter?: Initial Assessment Living Arrangements: Other (Comment) (Homeless) Can pt  return to current living arrangement?: Yes Admission Status: Voluntary Is patient capable of signing voluntary admission?: Yes Transfer from: Acute Hospital Referral Source: Self/Family/Friend     [facility] Crisis Care Plan Living Arrangements: Other (Comment) (Homeless) [facility] Name of Therapist: N/A     Risk to self with the past 6 months Suicidal Ideation: No Suicidal Intent: No Is patient at risk for  suicide?: No Suicidal Plan?: No Specify Current Suicidal Plan: None Access to Means: Yes Specify Access to Suicidal Means: Streets and sharp objects What has been your use of drugs/alcohol within the last 12 months?: ETOH, THC, and Crack Cocaine Previous Attempts/Gestures: No How many times?: 0 Other Self Harm Risks: Pt denies Triggers for Past Attempts: None known Intentional Self Injurious Behavior: None Family Suicide History: No Persecutory voices/beliefs?: No Depression: Yes Depression Symptoms: Guilt Substance abuse history and/or treatment for substance abuse?: Yes Suicide prevention information given to non-admitted patients: Not applicable  Risk to Others within the past 6 months Homicidal Ideation: No Thoughts of Harm to Others: No Comment - Thoughts of Harm to Others: Pt denies Current Homicidal Intent: No Current Homicidal Plan: No Describe Current Homicidal Plan: None Access to Homicidal Means: No Describe Access to Homicidal Means: None Identified Victim: No one History of harm to others?: No Assessment of Violence: None Noted Violent Behavior Description: Pt is calm and cooperative Does patient have access to weapons?: No Criminal Charges Pending?: Yes Describe Pending Criminal Charges: Soliciting Does patient have a court date: Yes Court Date: 04/04/14  Psychosis Hallucinations: None noted Delusions: None noted  Mental Status Report Appear/Hygiene: Disheveled Eye Contact: Fair Motor Activity: Freedom of movement Speech: Pressured Level of Consciousness: Alert Mood: Anxious Affect: Anxious Anxiety Level: Moderate Thought Processes: Coherent;Relevant Judgement: Impaired Orientation: Person;Place;Time;Situation Obsessive Compulsive Thoughts/Behaviors: None  Cognitive Functioning Concentration: Decreased Memory: Recent Intact IQ: Average Insight: Poor Impulse Control: Poor Appetite: Poor Weight Loss: 0 Weight Gain: 0 Sleep: No Change Total  Hours of Sleep: 7 Vegetative Symptoms: None  ADLScreening [facility]) Patient's cognitive ability adequate to safely complete daily activities?: Yes Patient able to express need for assistance with ADLs?: Yes Independently performs ADLs?: Yes (appropriate for developmental age)  Prior Inpatient Therapy Prior Inpatient Therapy: Yes Prior Therapy Dates: 2014 [facility]  Reason for Treatment: Schizophrenia  Prior Outpatient Therapy Prior Outpatient Therapy: Yes Prior Therapy Dates: 2015 Prior [facility] Facilty[facility]Provider([facility]): [name] Reason for Treatment: Schizophrenia  ADL Screening (condition at time of admission) Patient's cognitive ability adequate to safely complete daily activities?: Yes Is the patient deaf or have difficulty hearing?: No Does the patient have difficulty seeing, even when wearing glasses/contacts?: No Does the patient have difficulty concentrating, remembering, or making decisions?: No Patient able to express need for assistance with ADLs?: Yes Does the patient have difficulty dressing or bathing?: No Independently performs ADLs?: Yes (appropriate for developmental age) Does the patient have difficulty walking or climbing stairs?: No Weakness of Legs: None Weakness of Arms/Hands: None  Home Assistive Devices/Equipment Home Assistive Devices/Equipment: None  Therapy Consults (therapy consults require a physician order) PT Evaluation Needed: No OT Evalulation Needed: No SLP Evaluation Needed: No Abuse/Neglect Assessment (Assessment to be complete while patient is alone) Physical Abuse: Denies Verbal Abuse: Denies Sexual Abuse: Denies Exploitation of patient/patient's resources: Denies Self-Neglect: Denies Values / Beliefs Cultural Requests During Hospitalization: None Spiritual Requests During Hospitalization: None Consults Spiritual Care Consult Needed: No Social Work Consult Needed: No Merchant navy officerAdvance Directives (For  Healthcare) Does patient have an  advance directive?: No Would patient like information on creating an advanced directive?: No - patient declined information    Additional Information 1:1 In Past 12 Months?: No CIRT Risk: No Elopement Risk: No Does patient have medical clearance?: Yes     Disposition:  Disposition Initial Assessment Completed for this Encounter: Yes  On Site Evaluation by:   Reviewed with Physician:    Paulino Door, Tashana Haberl C 04/03/2014 5:03 PM

## 2014-04-03 NOTE — ED Notes (Signed)
Bed: WA11 Expected date:  Expected time:  Means of arrival:  Comments: 

## 2014-04-03 NOTE — ED Notes (Signed)
At the time I began the Diltiazem bolus/infusion pt. Was in sinus tach. With heart rate of 110.  He is in no distress and is eating with alacrity.

## 2014-04-03 NOTE — Progress Notes (Signed)
CSW received consult to discuss homelessness resources with pt.   CSW met with pt. Pt lethargic, eyes closed, dozing off during interview. Pt stated he did not have a place to live and was interested in [facility]. CSW provided list of homeless shelters. Pt called [facility] while CSW present, but no bed availability. CSW asked if pt was interested in looking at shelters out of county--pt stated he was not sure. Pt has list of all shelters in area with phone numbers.   Rochele Pages,     ED CSW  phone: [phone]

## 2014-04-03 NOTE — ED Provider Notes (Signed)
CSN: [id number]     Arrival date & time 04/03/14  1204 History   First MD Initiated Contact with Patient 04/03/14 1220     Chief Complaint  Patient presents with  . Schizophrenia     (Consider location/radiation/quality/duration/timing/severity/associated sxs/prior Treatment) The history is provided by the patient. The history is limited by the condition of the patient.  pt with hx chronic substance abuse/cocaine abuse, schizophrenia, presents after ems called as pt acting strangely at [employer].  Pt is very difficult/poor historian - level 5 caveat.  Pt states he was just going to church.  Also admits to recent cocaine and thc abuse. Denies etoh abuse.  Pt was recently in ED for behavioral health eval and substance abuse, with multiple, multiple prior evals for same.  Pt denies any pain or physical complaints. Denies any thoughts of harm to self or others. Not consistently compliant w home meds.         Past Medical History  Diagnosis Date  . Diabetes mellitus without complication   . Hypertension   . Schizophrenia   . CHF (congestive heart failure)   . Neuropathy    No past surgical history on file. Family History  Problem Relation Age of Onset  . Hypertension Other   . Diabetes Other    History  Substance Use Topics  . Smoking status: Current Every Day Smoker -- 1.00 packs/day    Types: Cigarettes  . Smokeless tobacco: Current User  . Alcohol Use: Yes     Comment: 40oz on 04/01/14    Review of Systems  Unable to perform ROS: Psychiatric disorder  Constitutional: Negative for fever.  HENT: Negative for sore throat.   Eyes: Negative for redness.  Respiratory: Negative for shortness of breath.   Cardiovascular: Negative for chest pain.  Gastrointestinal: Negative for vomiting, abdominal pain and diarrhea.  Genitourinary: Negative for flank pain.  Musculoskeletal: Negative for back pain and neck pain.  Skin: Negative for rash.  Neurological: Negative for headaches.   Hematological: Does not bruise/bleed easily.  Psychiatric/Behavioral: Negative for suicidal ideas.      Allergies  Haldol  Home Medications   Prior to Admission medications   Medication Sig Start Date End Date Taking? Authorizing Provider  ARIPiprazole (ABILIFY) 5 MG tablet Take 5 mg by mouth daily.    Yes Historical Provider, MD  benztropine (COGENTIN) 2 MG tablet Take 1 tablet (2 mg total) by mouth 2 (two) times daily. 04/01/14  Yes Geoffery Lyons, MD  cephALEXin (KEFLEX) 500 MG capsule Take 1 capsule (500 mg total) by mouth 2 (two) times daily. 04/01/14  Yes Geoffery Lyons, MD   BP 144/66  Pulse 138  Temp(Src) 98.9 F (37.2 C) (Oral)  Resp 18  SpO2 99% Physical Exam  Nursing note and vitals reviewed. Constitutional: He appears well-developed and well-nourished. No distress.  HENT:  Head: Atraumatic.  Mouth/Throat: Oropharynx is clear and moist.  Eyes: Conjunctivae are normal. Pupils are equal, round, and reactive to light. No scleral icterus.  Neck: Neck supple. No tracheal deviation present.  Cardiovascular: Normal heart sounds and intact distal pulses.  Exam reveals no gallop and no friction rub.   No murmur heard. Tachycardic, irregular  Pulmonary/Chest: Effort normal and breath sounds normal. No accessory muscle usage. No respiratory distress.  Abdominal: Soft. Bowel sounds are normal. He exhibits no distension. There is no tenderness.  Musculoskeletal: Normal range of motion. He exhibits no edema and no tenderness.  Neurological: He is alert.  Alert, motor intact bil.  stre 5/5. sens intact.   Skin: Skin is warm and dry. He is not diaphoretic.  Psychiatric:  Alert, content. Denies SI.     ED Course  Procedures (including critical care time) Labs Review   Results for orders placed during the hospital encounter of 04/03/14  CBC      Result Value Ref Range   WBC 10.2  4.0 - 10.5 K/uL   RBC 3.81 (*) 4.22 - 5.81 MIL/uL   Hemoglobin 11.2 (*) 13.0 - 17.0 g/dL   HCT  40.935.0 (*) 81.139.0 - 52.0 %   MCV 91.9  78.0 - 100.0 fL   MCH 29.4  26.0 - 34.0 pg   MCHC 32.0  30.0 - 36.0 g/dL   RDW 91.414.3  78.211.5 - 95.615.5 %   Platelets 193  150 - 400 K/uL  COMPREHENSIVE METABOLIC PANEL      Result Value Ref Range   Sodium 139  137 - 147 mEq/L   Potassium 3.7  3.7 - 5.3 mEq/L   Chloride 101  96 - 112 mEq/L   CO2 23  19 - 32 mEq/L   Glucose, Bld 164 (*) 70 - 99 mg/dL   BUN 12  6 - 23 mg/dL   Creatinine, Ser 2.130.75  0.50 - 1.35 mg/dL   Calcium 9.4  8.4 - 08.610.5 mg/dL   Total Protein 7.5  6.0 - 8.3 g/dL   Albumin 4.0  3.5 - 5.2 g/dL   AST 16  0 - 37 U/L   ALT 11  0 - 53 U/L   Alkaline Phosphatase 80  39 - 117 U/L   Total Bilirubin 0.8  0.3 - 1.2 mg/dL   GFR calc non Af Amer >90  >90 mL/min   GFR calc Af Amer >90  >90 mL/min   Anion gap 15  5 - 15  TROPONIN I      Result Value Ref Range   Troponin I <0.30  <0.30 ng/mL  URINE RAPID DRUG SCREEN (HOSP PERFORMED)      Result Value Ref Range   Opiates NONE DETECTED  NONE DETECTED   Cocaine POSITIVE (*) NONE DETECTED   Benzodiazepines NONE DETECTED  NONE DETECTED   Amphetamines NONE DETECTED  NONE DETECTED   Tetrahydrocannabinol POSITIVE (*) NONE DETECTED   Barbiturates NONE DETECTED  NONE DETECTED  ETHANOL      Result Value Ref Range   Alcohol, Ethyl (B) <11  0 - 11 mg/dL  URINALYSIS, ROUTINE W REFLEX MICROSCOPIC      Result Value Ref Range   Color, Urine YELLOW  YELLOW   APPearance CLEAR  CLEAR   Specific Gravity, Urine 1.013  1.005 - 1.030   pH 6.5  5.0 - 8.0   Glucose, UA 100 (*) NEGATIVE mg/dL   Hgb urine dipstick NEGATIVE  NEGATIVE   Bilirubin Urine NEGATIVE  NEGATIVE   Ketones, ur NEGATIVE  NEGATIVE mg/dL   Protein, ur NEGATIVE  NEGATIVE mg/dL   Urobilinogen, UA 1.0  0.0 - 1.0 mg/dL   Nitrite NEGATIVE  NEGATIVE   Leukocytes, UA TRACE (*) NEGATIVE  URINE MICROSCOPIC-ADD ON      Result Value Ref Range   Squamous Epithelial / LPF RARE  RARE   WBC, UA 11-20  <3 WBC/hpf   Casts HYALINE CASTS (*) NEGATIVE    Urine-Other MUCOUS PRESENT     Dg Chest 2 View  04/03/2014   CLINICAL DATA:  Chest pain, shortness of breath, cough; history of diabetes and CHF and cocaine dependence  EXAM: CHEST  2 VIEW  COMPARISON:  PA and lateral chest x-ray of April 01, 2014  FINDINGS: The lungs are mildly hyperinflated with hemidiaphragm flattening. The pulmonary interstitial markings are slightly more conspicuous today. There is no alveolar pneumonia. The heart and pulmonary vascularity are unremarkable. There is tortuosity of the descending thoracic aorta. A metallic pellet lies over the lower anterior chest wall in the midline.  IMPRESSION: There is hyperinflation consistent with COPD or reactive airway disease. There is no alveolar pneumonia. There is mild pulmonary interstitial prominence today which may reflect subsegmental atelectasis or acute bronchitis.   Electronically Signed   By: David  [place]   On: 04/03/2014 13:22   Dg Chest 2 View  04/01/2014   CLINICAL DATA:  Initial encounter are, shortness of breath and chest pain.  EXAM: CHEST  2 VIEW  COMPARISON:  12/16/2013.  FINDINGS: Trachea is midline. Heart size normal. Lungs are clear. No pleural fluid. A metallic density projects inferior to the sternum, as on the prior exam.  IMPRESSION: No acute findings.   Electronically Signed   By: Leanna Battles M.D.   On: 04/01/2014 22:58   Dg Abd 1 View  04/01/2014   CLINICAL DATA:  Stomach pain  EXAM: ABDOMEN - 1 VIEW  COMPARISON:  03/23/2014  FINDINGS: Gas and stool within the colon with prominent stool in the right colon. No small or large bowel distention. No radiopaque stones. Degenerative changes in the spine. Vascular calcifications.  IMPRESSION: Stool-filled colon.  Nonobstructive bowel gas pattern.   Electronically Signed   By: Burman Nieves M.D.   On: 04/01/2014 05:15   Dg Abd 2 Views  03/23/2014   CLINICAL DATA:  Urinary incontinence.  EXAM: ABDOMEN - 2 VIEW  COMPARISON:  09/14/2008  FINDINGS: Moderate volume of  formed stool, mainly in the proximal colon. No evidence of bowel obstruction or perforation. No concerning intra-abdominal mass effect or calcification. Bone islands in the proximal femora bilaterally, stable from 2010. Prominent heart size. Clear lung bases. Metallic foreign body overlaps the right base. This could represent a pellet within the soft tissues. Abdominal aortic atherosclerosis.  IMPRESSION: Nonobstructive bowel gas pattern.   Electronically Signed   By: Tiburcio Pea M.D.   On: 03/23/2014 05:56        Date: 04/03/2014  Rate: 126  Rhythm: atrial fibrillation  QRS Axis: normal  Intervals: normal  ST/T Wave abnormalities: nonspecific T wave changes  Conduction Disutrbances:none  Narrative Interpretation:   Old EKG Reviewed: afib w rvr new    MDM  Reviewed nursing notes and prior charts for additional history.   SW/CM consulted to assists w outpatient resources, home issues.  Pt also provided resource guide re substance abuse resources.  Pt with new onset afib w rvr.  No cp.  cardizem bolus and gtt.  On recheck pt in nsr, rate 84. No cp or sob.  ?whether transient afib due to recent cocaine abuse.  Given asymptomatic and in nsr, feel medically clear/stable for that standpoint.  Possible uti on labs. u cx sent. Will rx keflex until u cx back.   On recheck, pt talking to self/?walls.  Appears to responding to internal stimuli, ?delusional.  Will get psych team consult.    Suzi Roots, MD 04/03/14 [phone]

## 2014-04-03 NOTE — ED Notes (Signed)
Pt continually begging for food.  Pt has been given large amounts of food and drink and is still demanding more.  Security present, pt calming down, water pitcher given with cup and pt states he will not ask for more food and drink.

## 2014-04-03 NOTE — ED Notes (Signed)
Patient acting very aggressive verbally towards staff. He's demanding numerous things, one being food. He has had several snacks and has been informed that his dinner tray has been ordered and is own the way.

## 2014-04-04 NOTE — ED Notes (Signed)
Pt decided to leave due to not being allowed to get candy out of vending machine.  Explained to pt that he would not be allowed to wait in waiting room for AM bus run.  MD notified, pt discharged and escorted out by security.

## 2014-04-04 NOTE — ED Notes (Signed)
Patient requesting his clothes and to leave-Dr. Nicanor AlconPalumbo made aware that patient wants to leave

## 2014-04-05 LAB — URINE CULTURE

## 2014-04-06 ENCOUNTER — Encounter (HOSPITAL_COMMUNITY): Payer: Self-pay | Admitting: Emergency Medicine

## 2014-04-06 DIAGNOSIS — R609 Edema, unspecified: Secondary | ICD-10-CM | POA: Insufficient documentation

## 2014-04-06 DIAGNOSIS — Z720 Tobacco use: Secondary | ICD-10-CM | POA: Insufficient documentation

## 2014-04-06 DIAGNOSIS — E119 Type 2 diabetes mellitus without complications: Secondary | ICD-10-CM | POA: Insufficient documentation

## 2014-04-06 DIAGNOSIS — Z79899 Other long term (current) drug therapy: Secondary | ICD-10-CM | POA: Insufficient documentation

## 2014-04-06 DIAGNOSIS — F209 Schizophrenia, unspecified: Secondary | ICD-10-CM | POA: Insufficient documentation

## 2014-04-06 DIAGNOSIS — Z8669 Personal history of other diseases of the nervous system and sense organs: Secondary | ICD-10-CM | POA: Insufficient documentation

## 2014-04-06 DIAGNOSIS — I10 Essential (primary) hypertension: Secondary | ICD-10-CM | POA: Insufficient documentation

## 2014-04-06 DIAGNOSIS — I509 Heart failure, unspecified: Secondary | ICD-10-CM | POA: Insufficient documentation

## 2014-04-06 DIAGNOSIS — Z792 Long term (current) use of antibiotics: Secondary | ICD-10-CM | POA: Insufficient documentation
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
## 2014-04-07 ENCOUNTER — Emergency Department (HOSPITAL_COMMUNITY)
Admission: EM | Admit: 2014-04-07 | Discharge: 2014-04-07 | Disposition: A | Discharge status: Home / Self Care | Payer: Medicare Other | Attending: Emergency Medicine | Admitting: Emergency Medicine

## 2014-04-07 DIAGNOSIS — R609 Edema, unspecified: Secondary | ICD-10-CM

## 2014-04-07 LAB — COMPREHENSIVE METABOLIC PANEL
ALK PHOS: 77 U/L (ref 39–117)
ALT: 11 U/L (ref 0–53)
AST: 22 U/L (ref 0–37)
Albumin: 3.5 g/dL (ref 3.5–5.2)
Anion gap: 10 (ref 5–15)
BUN: 13 mg/dL (ref 6–23)
CALCIUM: 9.3 mg/dL (ref 8.4–10.5)
CO2: 26 meq/L (ref 19–32)
Chloride: 105 mEq/L (ref 96–112)
Creatinine, Ser: 0.73 mg/dL (ref 0.50–1.35)
GLUCOSE: 113 mg/dL — AB (ref 70–99)
POTASSIUM: 4 meq/L (ref 3.7–5.3)
Sodium: 141 mEq/L (ref 137–147)
Total Bilirubin: 0.8 mg/dL (ref 0.3–1.2)
Total Protein: 6.8 g/dL (ref 6.0–8.3)

## 2014-04-07 LAB — CBC WITH DIFFERENTIAL/PLATELET
BASOS ABS: 0 10*3/uL (ref 0.0–0.1)
Basophils Relative: 0 % (ref 0–1)
EOS PCT: 1 % (ref 0–5)
Eosinophils Absolute: 0.1 10*3/uL (ref 0.0–0.7)
HCT: 33.9 % — ABNORMAL LOW (ref 39.0–52.0)
Hemoglobin: 10.8 g/dL — ABNORMAL LOW (ref 13.0–17.0)
LYMPHS PCT: 17 % (ref 12–46)
Lymphs Abs: 1 10*3/uL (ref 0.7–4.0)
MCH: 29.3 pg (ref 26.0–34.0)
MCHC: 31.9 g/dL (ref 30.0–36.0)
MCV: 91.9 fL (ref 78.0–100.0)
Monocytes Absolute: 0.4 10*3/uL (ref 0.1–1.0)
Monocytes Relative: 7 % (ref 3–12)
Neutro Abs: 4.5 10*3/uL (ref 1.7–7.7)
Neutrophils Relative %: 75 % (ref 43–77)
PLATELETS: 196 10*3/uL (ref 150–400)
RBC: 3.69 MIL/uL — ABNORMAL LOW (ref 4.22–5.81)
RDW: 14.2 % (ref 11.5–15.5)
WBC: 6 10*3/uL (ref 4.0–10.5)

## 2014-04-07 LAB — URIC ACID: Uric Acid, Serum: 4 mg/dL (ref 4.0–7.8)

## 2014-04-07 MED ORDER — HYDROCHLOROTHIAZIDE 25 MG PO TABS
25.0000 mg | ORAL_TABLET | Freq: Every day | ORAL | Status: DC
  Administered 2014-04-07: 25 mg via ORAL
  Filled 2014-04-07: qty 1

## 2014-04-07 MED ORDER — TRAMADOL HCL 50 MG PO TABS
50.0000 mg | ORAL_TABLET | Freq: Once | ORAL | Status: AC
  Administered 2014-04-07: 50 mg via ORAL
  Filled 2014-04-07: qty 1

## 2014-04-07 MED ORDER — HYDROCHLOROTHIAZIDE 25 MG PO TABS: 25.0000 mg | ORAL_TABLET | Freq: Every day | ORAL | Status: DC

## 2014-04-07 MED ORDER — TRAMADOL HCL 50 MG PO TABS
50.0000 mg | ORAL_TABLET | Freq: Four times a day (QID) | ORAL | Status: DC | PRN
Start: 2014-04-07 — End: 2014-04-27

## 2014-04-07 NOTE — ED Notes (Signed)
Paged pt, no answer x1
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
## 2014-04-07 NOTE — ED Notes (Signed)
The pt returned to the waiting room. Wanting to be seen

## 2014-04-07 NOTE — Discharge Instructions (Signed)
Elevate your feet whenever possible.  Peripheral Edema You have swelling in your legs (peripheral edema). This swelling is due to excess accumulation of salt and water in your body. Edema may be a sign of heart, kidney or liver disease, or a side effect of a medication. It may also be due to problems in the leg veins. Elevating your legs and using special support stockings may be very helpful, if the cause of the swelling is due to poor venous circulation. Avoid long periods of standing, whatever the cause. Treatment of edema depends on identifying the cause. Chips, pretzels, pickles and other salty foods should be avoided. Restricting salt in your diet is almost always needed. Water pills (diuretics) are often used to remove the excess salt and water from your body via urine. These medicines prevent the kidney from reabsorbing sodium. This increases urine flow. Diuretic treatment may also result in lowering of potassium levels in your body. Potassium supplements may be needed if you have to use diuretics daily. Daily weights can help you keep track of your progress in clearing your edema. You should call your caregiver for follow up care as recommended. SEEK IMMEDIATE MEDICAL CARE IF:   You have increased swelling, pain, redness, or heat in your legs.  You develop shortness of breath, especially when lying down.  You develop chest or abdominal pain, weakness, or fainting.  You have a fever. Document Released: 07/25/2004 Document Revised: 09/09/2011 Document Reviewed: 07/05/2009 [facility] Patient Information 2015 [employer], [place]. This information is not intended to replace advice given to you by your health care provider. Make sure you discuss any questions you have with your health care provider.

## 2014-04-07 NOTE — ED Notes (Signed)
Unable to locate x 3

## 2014-04-13 ENCOUNTER — Encounter (HOSPITAL_COMMUNITY): Payer: Self-pay | Admitting: Emergency Medicine

## 2014-04-13 ENCOUNTER — Emergency Department (HOSPITAL_COMMUNITY)
Admission: EM | Admit: 2014-04-13 | Discharge: 2014-04-13 | Disposition: A | Discharge status: Home / Self Care | Payer: Medicare Other | Attending: Emergency Medicine | Admitting: Emergency Medicine

## 2014-04-13 DIAGNOSIS — Z79899 Other long term (current) drug therapy: Secondary | ICD-10-CM | POA: Insufficient documentation

## 2014-04-13 DIAGNOSIS — E119 Type 2 diabetes mellitus without complications: Secondary | ICD-10-CM | POA: Insufficient documentation

## 2014-04-13 DIAGNOSIS — L97519 Non-pressure chronic ulcer of other part of right foot with unspecified severity: Secondary | ICD-10-CM | POA: Insufficient documentation

## 2014-04-13 DIAGNOSIS — F200 Paranoid schizophrenia: Secondary | ICD-10-CM | POA: Insufficient documentation

## 2014-04-13 DIAGNOSIS — F1410 Cocaine abuse, uncomplicated: Secondary | ICD-10-CM | POA: Insufficient documentation

## 2014-04-13 DIAGNOSIS — Z792 Long term (current) use of antibiotics: Secondary | ICD-10-CM | POA: Insufficient documentation

## 2014-04-13 DIAGNOSIS — L97529 Non-pressure chronic ulcer of other part of left foot with unspecified severity: Secondary | ICD-10-CM

## 2014-04-13 DIAGNOSIS — I10 Essential (primary) hypertension: Secondary | ICD-10-CM | POA: Insufficient documentation

## 2014-04-13 DIAGNOSIS — I509 Heart failure, unspecified: Secondary | ICD-10-CM | POA: Insufficient documentation

## 2014-04-13 DIAGNOSIS — Z8669 Personal history of other diseases of the nervous system and sense organs: Secondary | ICD-10-CM | POA: Insufficient documentation

## 2014-04-13 DIAGNOSIS — Z720 Tobacco use: Secondary | ICD-10-CM | POA: Insufficient documentation

## 2014-04-13 MED ORDER — ARIPIPRAZOLE 5 MG PO TABS
5.0000 mg | ORAL_TABLET | Freq: Once | ORAL | Status: AC
  Administered 2014-04-13: 5 mg via ORAL
  Filled 2014-04-13: qty 1

## 2014-04-13 MED ORDER — BACITRACIN 500 UNIT/GM EX OINT
1.0000 "application " | TOPICAL_OINTMENT | Freq: Two times a day (BID) | CUTANEOUS | Status: DC
  Administered 2014-04-13: 1 via TOPICAL
  Filled 2014-04-13: qty 14

## 2014-04-13 MED ORDER — ACETAMINOPHEN 325 MG PO TABS
650.0000 mg | ORAL_TABLET | Freq: Once | ORAL | Status: AC
  Administered 2014-04-13: 650 mg via ORAL
  Filled 2014-04-13: qty 2

## 2014-04-13 NOTE — ED Notes (Signed)
MD at bedside. 

## 2014-04-13 NOTE — Discharge Instructions (Signed)
Schizophrenia °Schizophrenia is a mental illness. It may cause disturbed or disorganized thinking, speech, or behavior. People with schizophrenia have problems functioning in one or more areas of life: work, school, home, or relationships. People with schizophrenia are at increased risk for suicide, certain chronic physical illnesses, and unhealthy behaviors, such as smoking and drug use. °People who have family members with schizophrenia are at higher risk of developing the illness. Schizophrenia affects men and women equally but usually appears at an earlier age (teenage or early adult years) in men.  °SYMPTOMS °The earliest symptoms are often subtle (prodrome) and may go unnoticed until the illness becomes more severe (first-break psychosis). Symptoms of schizophrenia may be continuous or may come and go in severity. Episodes often are triggered by major life events, such as family stress, college, military service, marriage, pregnancy or child birth, divorce, or loss of a loved one. People with schizophrenia may see, hear, or feel things that do not exist (hallucinations). They may have false beliefs in spite of obvious proof to the contrary (delusions). Sometimes speech is incoherent or behavior is odd or withdrawn.  °DIAGNOSIS °Schizophrenia is diagnosed through an assessment by your caregiver. Your caregiver will ask questions about your thoughts, behavior, mood, and ability to function in daily life. Your caregiver may ask questions about your medical history and use of alcohol or drugs, including prescription medication. Your caregiver may also order blood tests and imaging exams. Certain medical conditions and substances can cause symptoms that resemble schizophrenia. Your caregiver may refer you to a mental health specialist for evaluation. There are three major criterion for a diagnosis of schizophrenia: °· Two or more of the following five symptoms are present for a month or longer: °¨ Delusions. Often  the delusions are that you are being attacked, harassed, cheated, persecuted or conspired against (persecutory delusions). °¨ Hallucinations.   °¨ Disorganized speech that does not make sense to others. °¨ Grossly disorganized (confused or unfocused) behavior or extremely overactive or underactive motor activity (catatonia). °¨ Negative symptoms such as bland or blunted emotions (flat affect), loss of will power (avolition), and withdrawal from social contacts (social isolation). °· Level of functioning in one or more major areas of life (work, school, relationships, or self-care) is markedly below the level of functioning before the onset of illness.   °· There are continuous signs of illness (either mild symptoms or decreased level of functioning) for at least 6 months or longer. °TREATMENT  °Schizophrenia is a long-term illness. It is best controlled with continuous treatment rather than treatment only when symptoms occur. The following treatments are used to manage schizophrenia: °· Medication--Medication is the most effective and important form of treatment for schizophrenia. Antipsychotic medications are usually prescribed to help manage schizophrenia. Other types of medication may be added to relieve any symptoms that may occur despite the use of antipsychotic medications. °· Counseling or talk therapy--Individual, group, or family counseling may be helpful in providing education, support, and guidance. Many people with schizophrenia also benefit from social skills and job skills (vocational) training. °A combination of medication and counseling is best for managing the disorder over time. A procedure in which electricity is applied to the brain through the scalp (electroconvulsive therapy) may be used to treat catatonic schizophrenia or schizophrenia in people who cannot take or do not respond to medication and counseling. °Document Released: 06/14/2000 Document Revised: 02/17/2013 Document Reviewed:  09/09/2012 °ExitCare® Patient Information ©2015 [employer]. This information is not intended to replace advice given to you by   your health care provider. Make sure you discuss any questions you have with your health care provider.  Stimulant Use Disorder-Cocaine Cocaine is one of a group of powerful drugs called stimulants. Cocaine has medical uses for stopping nosebleeds and for pain control before minor nose or dental surgery. However, cocaine is misused because of the effects that it produces. These effects include:   A feeling of extreme pleasure.  Alertness.  High energy. Common street names for cocaine include coke, crack, blow, snow, and nose candy. Cocaine is snorted, dissolved in water and injected, or smoked.  Stimulants are addictive because they activate regions of the brain that produce both the pleasurable sensation of "reward" and psychological dependence. Together, these actions account for loss of control and the rapid development of drug dependence. This means you become ill without the drug (withdrawal) and need to keep using it to function.  Stimulant use disorder is use of stimulants that disrupts your daily life. It disrupts relationships with family and friends and how you do your job. Cocaine increases your blood pressure and heart rate. It can cause a heart attack or stroke. Cocaine can also cause death from irregular heart rate or seizures. SYMPTOMS Symptoms of stimulant use disorder with cocaine include:  Use of cocaine in larger amounts or over a longer period of time than intended.  Unsuccessful attempts to cut down or control cocaine use.  A lot of time spent obtaining, using, or recovering from the effects of cocaine.  A strong desire or urge to use cocaine (craving).  Continued use of cocaine in spite of major problems at work, school, or home because of use.  Continued use of cocaine in spite of relationship problems because of use.  Giving up or  cutting down on important life activities because of cocaine use.  Use of cocaine over and over in situations when it is physically hazardous, such as driving a car.  Continued use of cocaine in spite of a physical problem that is likely related to use. Physical problems can include:  Malnutrition.  Nosebleeds.  Chest pain.  High blood pressure.  A hole that develops between the part of your nose that separates your nostrils (perforated nasal septum).  Lung and kidney damage.  Continued use of cocaine in spite of a mental problem that is likely related to use. Mental problems can include:  Schizophrenia-like symptoms.  Depression.  Bipolar mood swings.  Anxiety.  Sleep problems.  Need to use more and more cocaine to get the same effect, or lessened effect over time with use of the same amount of cocaine (tolerance).  Having withdrawal symptoms when cocaine use is stopped, or using cocaine to reduce or avoid withdrawal symptoms. Withdrawal symptoms include:  Depressed or irritable mood.  Low energy or restlessness.  Bad dreams.  Poor or excessive sleep.  Increased appetite. DIAGNOSIS Stimulant use disorder is diagnosed by your health care provider. You may be asked questions about your cocaine use and how it affects your life. A physical exam may be done. A drug screen may be ordered. You may be referred to a mental health professional. The diagnosis of stimulant use disorder requires at least two symptoms within 12 months. The type of stimulant use disorder depends on the number of signs and symptoms you have. The type may be:  Mild. Two or three signs and symptoms.  Moderate. Four or five signs and symptoms.  Severe. Six or more signs and symptoms. TREATMENT Treatment for stimulant use disorder  is usually provided by mental health professionals with training in substance use disorders. The following options are available:  Counseling or talk therapy. Talk  therapy addresses the reasons you use cocaine and ways to keep you from using again. Goals of talk therapy include:  Identifying and avoiding triggers for use.  Handling cravings.  Replacing use with healthy activities.  Support groups. Support groups provide emotional support, advice, and guidance.  Medicine. Certain medicines may decrease cocaine cravings or withdrawal symptoms. HOME CARE INSTRUCTIONS  Take medicines only as directed by your health care provider.  Identify the people and activities that trigger your cocaine use and avoid them.  Keep all follow-up visits as directed by your health care provider. SEEK MEDICAL CARE IF:  Your symptoms get worse or you relapse.  You are not able to take medicines as directed. SEEK IMMEDIATE MEDICAL CARE IF:  You have serious thoughts about hurting yourself or others.  You have a seizure, chest pain, sudden weakness, or loss of speech or vision. FOR MORE INFORMATION  National Institute on Drug Abuse: http://www.price-smith.com/www.drugabuse.gov  Substance Abuse and Mental Health Services Administration: Skate[employer]asis.com.ptwww.samhsa.gov Document Released: 06/14/2000 Document Revised: 11/01/2013 Document Reviewed: 06/30/2013 [facility] Patient Information 2015 [place], [place]. This information is not intended to replace advice given to you by your health care provider. Make sure you discuss any questions you have with your health care provider.

## 2014-04-13 NOTE — ED Provider Notes (Signed)
CSN: [id number]     Arrival date & time 04/13/14  0557 History   First MD Initiated Contact with Patient 04/13/14 0617     Chief Complaint  Patient presents with  . Medical Clearance  . Foot Injury     (Consider location/radiation/quality/duration/timing/severity/associated sxs/prior Treatment) Patient is a 53 y.o. male presenting with foot injury. The history is provided by the patient.  Foot Injury  He actually has chronic ulcers on his feet which she is requesting that they be dressed. He is specifically requesting Band-Aids and hydrogen peroxide. He states that he has schizophrenia and abuses cocaine. His current problems are that he is supposed to get a check each month objective not, this month and therefore he is behind in [employer]. He has been abusing cocaine. He states that if he can just get his wounds dressed and a dose of acetaminophen and a dose of Ariprazole, that he can go home. He denies suicidal ideation and denies hallucinations. He he states that in the past he has claimed suicidal ideation when he wanted to be admitted but he is very clear that he does not have any suicidal ideation currently. He states that he did write a letter to can't sit in the alignment guide was ready to take and that it could take him. He does state that [facility] but has failed to followup with them. Past Medical History  Diagnosis Date  . Diabetes mellitus without complication   . Hypertension   . Schizophrenia   . CHF (congestive heart failure)   . Neuropathy    History reviewed. No pertinent past surgical history. Family History  Problem Relation Age of Onset  . Hypertension Other   . Diabetes Other    History  Substance Use Topics  . Smoking status: Current Every Day Smoker -- 1.00 packs/day    Types: Cigarettes  . Smokeless tobacco: Current User  . Alcohol Use: Yes     Comment: 40oz on 04/01/14    Review of Systems  All other systems reviewed and are  negative.   Allergies  Haldol  Home Medications   Prior to Admission medications   Medication Sig Start Date End Date Taking? Authorizing Provider  ARIPiprazole (ABILIFY) 5 MG tablet Take 5 mg by mouth daily.     Historical Provider, MD  benztropine (COGENTIN) 2 MG tablet Take 1 tablet (2 mg total) by mouth 2 (two) times daily. 04/01/14   Geoffery Lyonsouglas Delo, MD  cephALEXin (KEFLEX) 500 MG capsule Take 1 capsule (500 mg total) by mouth 2 (two) times daily. 04/01/14   Geoffery Lyonsouglas Delo, MD  hydrochlorothiazide (HYDRODIURIL) 25 MG tablet Take 1 tablet (25 mg total) by mouth daily. 04/07/14   Rolland PorterMark James, MD  traMADol (ULTRAM) 50 MG tablet Take 1 tablet (50 mg total) by mouth every 6 (six) hours as needed. 04/07/14   Rolland PorterMark James, MD   BP 167/81  Pulse 89  Temp(Src) 98.4 F (36.9 C) (Oral)  Resp 18  Ht 5\' 9"  (1.753 m)  Wt 170 lb (77.111 kg)  BMI 25.09 kg/m2  SpO2 100% Physical Exam  Nursing note and vitals reviewed.  53 year old male, resting comfortably and in no acute distress. Vital signs are significant for hypertension. Oxygen saturation is 100%, which is normal. Head is normocephalic and atraumatic. PERRLA, EOMI. Oropharynx is clear. Neck is nontender and supple without adenopathy or JVD. Back is nontender and there is no CVA tenderness. Lungs are clear without  rales, wheezes, or rhonchi. Chest is nontender. Heart has regular rate and rhythm without murmur. Abdomen is soft, flat, nontender without masses or hepatosplenomegaly and peristalsis is normoactive. Extremities have no cyanosis or edema, full range of motion is present. There is a small ulcer on the posterior aspect of the right heel, small ulcer in the posterior aspect of the right heel and also on the lateral aspect of the left heel. These all appear clean without signs of infection. Skin is warm and dry without rash. Neurologic: Mental status is normal, cranial nerves are intact, there are no motor or sensory deficits.  ED Course   Procedures (including critical care time)  MDM   Final diagnoses:  Foot ulcer, left  Foot ulcer, right  Paranoid schizophrenia, chronic condition  Cocaine abuse    Patient with long-standing schizophrenia that does not appear to be decompensated. He has not suicidal or homicidal. He states that he did not get his check this month as he is supposed to so he has not been able to get his medication. Requesting a dose of Abilify and a dose of acetaminophen and have his ulcers dressed. This seems to be a reasonable approach. He is not actively suicidal and is not need inpatient care. [facility] Old records are reviewed and he has had several similar ED visits which have not required inpatient psychiatric care.    Dione Boozeavid Matai Carpenito, MD 04/13/14 [phone]

## 2014-04-13 NOTE — ED Notes (Signed)
Patient alert and oriented at discharge.  Pt given a bus pass at discharge.  Pt refused vital signs at discharge with this RN.

## 2014-04-13 NOTE — ED Notes (Signed)
Pt reports he is suicidal. Pt reports addiction to cocaine as well. Pt reports he does have a plan however he wants his life to end. Pt reports he also has open wounds on his bilateral feet with swelling. Pt is A&O X3.

## 2014-04-13 NOTE — ED Notes (Signed)
Pt was very rude with this RN.  This RN asked the patient to show return demonstration on applying the ointment provided so he is properly using it when he leaves.  The patient states "I can't get a woman, a white woman to do anything for me".  I stated is important for you to know how to properly treat your wounds.  Another RN called to bedside to witness the patients actions.  This RN applied gauze and dressings to both feet.  Pt advised that he cannot leave wearing the purple scrubs.  Pt upset that he was advised by staff that he had to wear his own clothes out of the emergency room.  Pt continues to talk to himself while dressing.

## 2014-04-13 NOTE — ED Notes (Signed)
Security notified to have patient wanded.

## 2014-04-27 ENCOUNTER — Encounter (HOSPITAL_COMMUNITY): Payer: Self-pay | Admitting: Emergency Medicine

## 2014-04-27 DIAGNOSIS — Z8659 Personal history of other mental and behavioral disorders: Secondary | ICD-10-CM | POA: Insufficient documentation

## 2014-04-27 DIAGNOSIS — Z79899 Other long term (current) drug therapy: Secondary | ICD-10-CM | POA: Insufficient documentation

## 2014-04-27 DIAGNOSIS — I509 Heart failure, unspecified: Secondary | ICD-10-CM | POA: Insufficient documentation

## 2014-04-27 DIAGNOSIS — I10 Essential (primary) hypertension: Secondary | ICD-10-CM | POA: Insufficient documentation

## 2014-04-27 DIAGNOSIS — R300 Dysuria: Secondary | ICD-10-CM | POA: Insufficient documentation

## 2014-04-27 DIAGNOSIS — Z8669 Personal history of other diseases of the nervous system and sense organs: Secondary | ICD-10-CM | POA: Insufficient documentation

## 2014-04-27 DIAGNOSIS — Z720 Tobacco use: Secondary | ICD-10-CM | POA: Insufficient documentation

## 2014-04-27 DIAGNOSIS — E119 Type 2 diabetes mellitus without complications: Secondary | ICD-10-CM | POA: Insufficient documentation

## 2014-04-27 NOTE — ED Notes (Addendum)
Per ems- pt sts he was seen 2 weeks ago for infection to groin, was given prescription but lost it when he went to jail. sts pain in groin is worse. Pt c/o pain when he urinates.

## 2014-04-28 ENCOUNTER — Emergency Department (HOSPITAL_COMMUNITY)
Admission: EM | Admit: 2014-04-28 | Discharge: 2014-04-28 | Disposition: A | Discharge status: Home / Self Care | Payer: Medicare Other | Attending: Emergency Medicine | Admitting: Emergency Medicine

## 2014-04-28 DIAGNOSIS — R300 Dysuria: Secondary | ICD-10-CM

## 2014-04-28 LAB — URINALYSIS, ROUTINE W REFLEX MICROSCOPIC
Bilirubin Urine: NEGATIVE
GLUCOSE, UA: NEGATIVE mg/dL
HGB URINE DIPSTICK: NEGATIVE
KETONES UR: NEGATIVE mg/dL
Nitrite: NEGATIVE
PH: 6 (ref 5.0–8.0)
Protein, ur: NEGATIVE mg/dL
Specific Gravity, Urine: 1.015 (ref 1.005–1.030)
Urobilinogen, UA: 0.2 mg/dL (ref 0.0–1.0)

## 2014-04-28 LAB — URINE MICROSCOPIC-ADD ON

## 2014-04-28 MED ORDER — ARIPIPRAZOLE 5 MG PO TABS: 5.0000 mg | ORAL_TABLET | Freq: Every day | ORAL | Status: DC

## 2014-04-28 MED ORDER — CEPHALEXIN 500 MG PO CAPS: 500.0000 mg | ORAL_CAPSULE | Freq: Two times a day (BID) | ORAL | Status: DC

## 2014-04-28 NOTE — ED Notes (Signed)
Pt informed that he needs to follow up with primary care in order to continue to get abilify RX.

## 2014-04-28 NOTE — ED Provider Notes (Signed)
CSN: [id number]     Arrival date & time 04/27/14  2136 History   First MD Initiated Contact with Patient 04/28/14 0059     Chief Complaint  Patient presents with  . Groin Pain     (Consider location/radiation/quality/duration/timing/severity/associated sxs/prior Treatment) HPI  This is a 53 year old male with a history of diabetes, hypertension, schizophrenia who presents with dysuria. Patient reports that he was evaluated several weeks ago and told "you have an infection in her groin." He was given antibiotics but never got them filled. He has been in jail for the last 2 weeks. He reports persistent dysuria. Denies any penile discharge. Denies any sexual encounters or sexual activity. Denies scrotal pain or abdominal pain. No fevers. No rash noted. Patient states "I'm just here for antibiotics, Tylenol, and a bus pass."  Past Medical History  Diagnosis Date  . Diabetes mellitus without complication   . Hypertension   . Schizophrenia   . CHF (congestive heart failure)   . Neuropathy    History reviewed. No pertinent past surgical history. Family History  Problem Relation Age of Onset  . Hypertension Other   . Diabetes Other    History  Substance Use Topics  . Smoking status: Current Every Day Smoker -- 1.00 packs/day    Types: Cigarettes  . Smokeless tobacco: Current User  . Alcohol Use: Yes     Comment: 40oz on 04/01/14    Review of Systems  Constitutional: Negative.  Negative for fever.  Respiratory: Negative.  Negative for chest tightness and shortness of breath.   Cardiovascular: Negative.  Negative for chest pain.  Gastrointestinal: Negative.  Negative for abdominal pain.  Genitourinary: Positive for dysuria. Negative for flank pain, discharge, penile swelling, scrotal swelling and penile pain.  Skin: Negative for rash.  All other systems reviewed and are negative.     Allergies  Haldol  Home Medications   Prior to Admission medications   Medication Sig  Start Date End Date Taking? Authorizing Provider  ARIPiprazole (ABILIFY) 5 MG tablet Take 5 mg by mouth daily.    Yes Historical Provider, MD  benztropine (COGENTIN) 2 MG tablet Take 1 tablet (2 mg total) by mouth 2 (two) times daily. 04/01/14  Yes Geoffery Lyonsouglas Delo, MD  hydrochlorothiazide (HYDRODIURIL) 25 MG tablet Take 1 tablet (25 mg total) by mouth daily. 04/07/14  Yes Rolland PorterMark James, MD  traMADol (ULTRAM) 50 MG tablet Take 50 mg by mouth every 6 (six) hours as needed for moderate pain. 04/07/14  Yes Rolland PorterMark James, MD  cephALEXin (KEFLEX) 500 MG capsule Take 1 capsule (500 mg total) by mouth 2 (two) times daily. 04/28/14   Shon Batonourtney F Corene Resnick, MD   BP 132/79  Pulse 81  Temp(Src) 98.6 F (37 C) (Oral)  Resp 20  SpO2 100% Physical Exam  Nursing note and vitals reviewed. Constitutional: He is oriented to person, place, and time. No distress.  Disheveled   HENT:  Head: Normocephalic and atraumatic.  Cardiovascular: Normal rate and regular rhythm.   Pulmonary/Chest: Effort normal. No respiratory distress.  Abdominal: Soft. There is no tenderness.  Genitourinary: Penis normal.  Circumcised penis, no obvious drainage of from the urethra, no scrotal tenderness or testicular tenderness, no crepitus noted  Musculoskeletal: He exhibits no edema.  Lymphadenopathy:    He has no cervical adenopathy.  Neurological: He is alert and oriented to person, place, and time.  Skin: Skin is warm and dry.  Psychiatric: He has a normal mood and affect.    ED Course  Procedures (including critical care time) Labs Review Labs Reviewed  URINALYSIS, ROUTINE W REFLEX MICROSCOPIC - Abnormal; Notable for the following:    Leukocytes, UA TRACE (*)    All other components within normal limits  GC/CHLAMYDIA PROBE AMP  URINE CULTURE  URINE MICROSCOPIC-ADD ON    Imaging Review No results found.   EKG Interpretation None      MDM   Final diagnoses:  Dysuria    Patient presents with dysuria. Was given Keflex  on 04/03/2014 for possible UTI. Urine culture showed multiple organisms less than 25,000 colonies. No evidence of crepitus, testicular pain or testicular discharge on exam. Patient declining STD treatment at this time. GC Chlamydia screen was sent. Urinalysis with 3-6 white cells but otherwise reassuring. Given patient continues to endorse dysuria, will provide him with a prescription for Keflex awaiting repeat urine culture.  After history, exam, and medical workup I feel the patient has been appropriately medically screened and is safe for discharge home. Pertinent diagnoses were discussed with the patient. Patient was given return precautions.     Shon Batonourtney F Adline Kirshenbaum, MD 04/28/14 [phone]

## 2014-04-28 NOTE — ED Notes (Signed)
Called to take to tr room no answer not in the waiting room

## 2014-04-28 NOTE — ED Notes (Signed)
Vitals recheck  Advised of the wait

## 2014-04-28 NOTE — Discharge Instructions (Signed)
Dysuria Dysuria is the medical term for pain with urination. There are many causes for dysuria, but urinary tract infection is the most common. If a urinalysis was performed it can show that there is a urinary tract infection. A urine culture confirms that you or your child is sick. You will need to follow up with a healthcare provider because:  If a urine culture was done you will need to know the culture results and treatment recommendations.  If the urine culture was positive, you or your child will need to be put on antibiotics or know if the antibiotics prescribed are the right antibiotics for your urinary tract infection.  If the urine culture is negative (no urinary tract infection), then other causes may need to be explored or antibiotics need to be stopped. Today laboratory work may have been done and there does not seem to be an infection. If cultures were done they will take at least 24 to 48 hours to be completed. Today x-rays may have been taken and they read as normal. No cause can be found for the problems. The x-rays may be re-read by a radiologist and you will be contacted if additional findings are made. You or your child may have been put on medications to help with this problem until you can see your primary caregiver. If the problems get better, see your primary caregiver if the problems return. If you were given antibiotics (medications which kill germs), take all of the mediations as directed for the full course of treatment.  If laboratory work was done, you need to find the results. Leave a telephone number where you can be reached. If this is not possible, make sure you find out how you are to get test results. HOME CARE INSTRUCTIONS   Drink lots of fluids. For adults, drink eight, 8 ounce glasses of clear juice or water a day. For children, replace fluids as suggested by your caregiver.  Empty the bladder often. Avoid holding urine for long periods of time.  After a bowel  movement, women should cleanse front to back, using each tissue only once.  Empty your bladder before and after sexual intercourse.  Take all the medicine given to you until it is gone. You may feel better in a few days, but TAKE ALL MEDICINE.  Avoid caffeine, tea, alcohol and carbonated beverages, because they tend to irritate the bladder.  In men, alcohol may irritate the prostate.  Only take over-the-counter or prescription medicines for pain, discomfort, or fever as directed by your caregiver.  If your caregiver has given you a follow-up appointment, it is very important to keep that appointment. Not keeping the appointment could result in a chronic or permanent injury, pain, and disability. If there is any problem keeping the appointment, you must call back to this facility for assistance. SEEK IMMEDIATE MEDICAL CARE IF:   Back pain develops.  A fever develops.  There is nausea (feeling sick to your stomach) or vomiting (throwing up).  Problems are no better with medications or are getting worse. MAKE SURE YOU:   Understand these instructions.  Will watch your condition.  Will get help right away if you are not doing well or get worse. Document Released: 03/15/2004 Document Revised: 09/09/2011 Document Reviewed: 01/21/2008 [employer] Patient Information 2015 [employer]. This information is not intended to replace advice given to you by your health care provider. Make sure you discuss any questions you have with your health care provider.  

## 2014-04-28 NOTE — ED Notes (Signed)
Witnessed genital exam performed by Dr. Wilkie AyeHorton.

## 2014-04-29 LAB — GC/CHLAMYDIA PROBE AMP
CT Probe RNA: NEGATIVE
GC Probe RNA: NEGATIVE

## 2014-04-29 LAB — URINE CULTURE
Colony Count: NO GROWTH
Culture: NO GROWTH

## 2014-04-30 ENCOUNTER — Emergency Department (HOSPITAL_COMMUNITY)
Admission: EM | Admit: 2014-04-30 | Discharge: 2014-05-01 | Disposition: A | Discharge status: Home / Self Care | Payer: Medicare Other | Attending: Emergency Medicine | Admitting: Emergency Medicine

## 2014-04-30 ENCOUNTER — Encounter (HOSPITAL_COMMUNITY): Payer: Self-pay | Admitting: Emergency Medicine

## 2014-04-30 DIAGNOSIS — Z720 Tobacco use: Secondary | ICD-10-CM | POA: Insufficient documentation

## 2014-04-30 DIAGNOSIS — I10 Essential (primary) hypertension: Secondary | ICD-10-CM | POA: Insufficient documentation

## 2014-04-30 DIAGNOSIS — R0789 Other chest pain: Secondary | ICD-10-CM | POA: Insufficient documentation

## 2014-04-30 DIAGNOSIS — R109 Unspecified abdominal pain: Secondary | ICD-10-CM | POA: Insufficient documentation

## 2014-04-30 DIAGNOSIS — F209 Schizophrenia, unspecified: Secondary | ICD-10-CM | POA: Insufficient documentation

## 2014-04-30 DIAGNOSIS — I509 Heart failure, unspecified: Secondary | ICD-10-CM | POA: Insufficient documentation

## 2014-04-30 DIAGNOSIS — Z8669 Personal history of other diseases of the nervous system and sense organs: Secondary | ICD-10-CM | POA: Insufficient documentation

## 2014-04-30 DIAGNOSIS — J029 Acute pharyngitis, unspecified: Secondary | ICD-10-CM | POA: Insufficient documentation

## 2014-04-30 DIAGNOSIS — Z79899 Other long term (current) drug therapy: Secondary | ICD-10-CM | POA: Insufficient documentation

## 2014-04-30 DIAGNOSIS — E119 Type 2 diabetes mellitus without complications: Secondary | ICD-10-CM | POA: Insufficient documentation

## 2014-04-30 DIAGNOSIS — Z792 Long term (current) use of antibiotics: Secondary | ICD-10-CM | POA: Insufficient documentation

## 2014-04-30 LAB — CBC WITH DIFFERENTIAL/PLATELET
BASOS ABS: 0 10*3/uL (ref 0.0–0.1)
BASOS PCT: 1 % (ref 0–1)
EOS ABS: 0 10*3/uL (ref 0.0–0.7)
Eosinophils Relative: 1 % (ref 0–5)
HCT: 35 % — ABNORMAL LOW (ref 39.0–52.0)
Hemoglobin: 11.2 g/dL — ABNORMAL LOW (ref 13.0–17.0)
Lymphocytes Relative: 32 % (ref 12–46)
Lymphs Abs: 1.4 10*3/uL (ref 0.7–4.0)
MCH: 29.9 pg (ref 26.0–34.0)
MCHC: 32 g/dL (ref 30.0–36.0)
MCV: 93.3 fL (ref 78.0–100.0)
MONOS PCT: 9 % (ref 3–12)
Monocytes Absolute: 0.4 10*3/uL (ref 0.1–1.0)
NEUTROS PCT: 57 % (ref 43–77)
Neutro Abs: 2.5 10*3/uL (ref 1.7–7.7)
PLATELETS: 178 10*3/uL (ref 150–400)
RBC: 3.75 MIL/uL — ABNORMAL LOW (ref 4.22–5.81)
RDW: 13.1 % (ref 11.5–15.5)
WBC: 4.3 10*3/uL (ref 4.0–10.5)

## 2014-04-30 LAB — COMPREHENSIVE METABOLIC PANEL
ALK PHOS: 86 U/L (ref 39–117)
ALT: 8 U/L (ref 0–53)
AST: 20 U/L (ref 0–37)
Albumin: 3.7 g/dL (ref 3.5–5.2)
Anion gap: 10 (ref 5–15)
BILIRUBIN TOTAL: 0.3 mg/dL (ref 0.3–1.2)
BUN: 16 mg/dL (ref 6–23)
CO2: 27 meq/L (ref 19–32)
Calcium: 9.5 mg/dL (ref 8.4–10.5)
Chloride: 109 mEq/L (ref 96–112)
Creatinine, Ser: 0.92 mg/dL (ref 0.50–1.35)
GLUCOSE: 82 mg/dL (ref 70–99)
POTASSIUM: 3.7 meq/L (ref 3.7–5.3)
Sodium: 146 mEq/L (ref 137–147)
Total Protein: 7.1 g/dL (ref 6.0–8.3)

## 2014-04-30 LAB — I-STAT TROPONIN, ED: TROPONIN I, POC: 0.02 ng/mL (ref 0.00–0.08)

## 2014-04-30 MED ORDER — ACETAMINOPHEN 325 MG PO TABS
650.0000 mg | ORAL_TABLET | Freq: Once | ORAL | Status: AC
  Administered 2014-04-30: 650 mg via ORAL
  Filled 2014-04-30: qty 2

## 2014-04-30 NOTE — ED Notes (Signed)
Pt complains of abd pain since earlier tonight after having a BM and it getting going back up in his rectum halfway through and causing him pain.

## 2014-04-30 NOTE — ED Notes (Signed)
Bed: [facility] Expected date:  Expected time:  Means of arrival:  Comments: EMS/53 yo male with abdominal pain

## 2014-04-30 NOTE — ED Notes (Signed)
Pt states he's ready to get off the streets and wants to be placed in a group home, pt has no complaints at this time.

## 2014-04-30 NOTE — ED Notes (Signed)
Upon arrival to room patient was non responsive. Once patient was transported to bed, patient is A&O to baseline. Patient c/o throat, chest, abdominal pain. EKG performed. Dr. Barry BrunnerJocubowitz at bedside at this time. Patient SR 70 on monitor. Patient requesting "orange juice and something to soak feet in". Verbal ok from Dr. Barry BrunnerJocubowitz.

## 2014-04-30 NOTE — ED Provider Notes (Signed)
CSN: [id number]     Arrival date & time 04/30/14  2029 History   First MD Initiated Contact with Patient 04/30/14 2122     Chief Complaint  Patient presents with  . Abdominal Pain     (Consider location/radiation/quality/duration/timing/severity/associated sxs/prior Treatment) HPI Complains of chest pain abdominal pain and sore throat onset 2.5 hours ago. Discomfort is constant, mild. Nothing makes symptoms better or worse. Patient is presently hungry. His last bowel movement was this afternoon he states "It came partially out and then went back up inside of me.' Denies urinary symptoms but no fever no cough no other associated symptoms. He admits to using crack cocaine earlier today and uses crack cocaine daily. No shortness of breath. No treatment prior to coming here he is requesting Tylenol. Past Medical History  Diagnosis Date  . Diabetes mellitus without complication   . Hypertension   . Schizophrenia   . CHF (congestive heart failure)   . Neuropathy    History reviewed. No pertinent past surgical history. Family History  Problem Relation Age of Onset  . Hypertension Other   . Diabetes Other    History  Substance Use Topics  . Smoking status: Current Every Day Smoker -- 1.00 packs/day    Types: Cigarettes  . Smokeless tobacco: Current User  . Alcohol Use: Yes     Comment: 40oz on 04/01/14   admits to smoking crack cocaine  Review of Systems  Constitutional: Negative.   HENT: Positive for sore throat.   Respiratory: Negative.   Cardiovascular: Positive for chest pain.  Gastrointestinal: Positive for abdominal pain.  Musculoskeletal: Negative.   Skin: Negative.   Neurological: Negative.   Psychiatric/Behavioral: Negative.   All other systems reviewed and are negative.     Allergies  Haldol  Home Medications   Prior to Admission medications   Medication Sig Start Date End Date Taking? Authorizing Provider  ARIPiprazole (ABILIFY) 5 MG tablet Take 1 tablet (5  mg total) by mouth daily. 04/28/14   Shon Batonourtney F Horton, MD  benztropine (COGENTIN) 2 MG tablet Take 1 tablet (2 mg total) by mouth 2 (two) times daily. 04/01/14   Geoffery Lyonsouglas Delo, MD  cephALEXin (KEFLEX) 500 MG capsule Take 1 capsule (500 mg total) by mouth 2 (two) times daily. 04/28/14   Shon Batonourtney F Horton, MD  hydrochlorothiazide (HYDRODIURIL) 25 MG tablet Take 1 tablet (25 mg total) by mouth daily. 04/07/14   Rolland PorterMark James, MD  traMADol (ULTRAM) 50 MG tablet Take 50 mg by mouth every 6 (six) hours as needed for moderate pain. 04/07/14   Rolland PorterMark James, MD   BP 171/94  Pulse 78  Temp(Src) 98.7 F (37.1 C) (Oral)  Resp 18  SpO2 100% Physical Exam  Nursing note and vitals reviewed. Constitutional: He appears well-developed and well-nourished.  HENT:  Head: Normocephalic and atraumatic.  Eyes: Conjunctivae are normal. Pupils are equal, round, and reactive to light.  Neck: Neck supple. No tracheal deviation present. No thyromegaly present.  Cardiovascular: Normal rate and regular rhythm.   No murmur heard. Pulmonary/Chest: Effort normal and breath sounds normal.  Abdominal: Soft. Bowel sounds are normal. He exhibits no distension. There is no tenderness.  Musculoskeletal: Normal range of motion. He exhibits no edema and no tenderness.  Neurological: He is alert. Coordination normal.  Skin: Skin is warm and dry. No rash noted.  Psychiatric: He has a normal mood and affect.    ED Course  Procedures (including critical care time) Labs Review Labs Reviewed - No data to  display  Imaging Review No results found.   EKG Interpretation   Date/Time:  Saturday April 30 2014 21:29:27 EDT Ventricular Rate:  65 PR Interval:  151 QRS Duration: 74 QT Interval:  467 QTC Calculation: 486 R Axis:   50 Text Interpretation:  Sinus rhythm Anteroseptal infarct, old Since last  tracing rate slower Confirmed by Ethelda Chick  MD, Deshayla Empson [id number]) on 04/30/2014  9:50:49 PM      Results for orders placed during  the hospital encounter of 04/30/14  COMPREHENSIVE METABOLIC PANEL      Result Value Ref Range   Sodium 146  137 - 147 mEq/L   Potassium 3.7  3.7 - 5.3 mEq/L   Chloride 109  96 - 112 mEq/L   CO2 27  19 - 32 mEq/L   Glucose, Bld 82  70 - 99 mg/dL   BUN 16  6 - 23 mg/dL   Creatinine, Ser 5.36  0.50 - 1.35 mg/dL   Calcium 9.5  8.4 - 64.4 mg/dL   Total Protein 7.1  6.0 - 8.3 g/dL   Albumin 3.7  3.5 - 5.2 g/dL   AST 20  0 - 37 U/L   ALT 8  0 - 53 U/L   Alkaline Phosphatase 86  39 - 117 U/L   Total Bilirubin 0.3  0.3 - 1.2 mg/dL   GFR calc non Af Amer >90  >90 mL/min   GFR calc Af Amer >90  >90 mL/min   Anion gap 10  5 - 15  CBC WITH DIFFERENTIAL      Result Value Ref Range   WBC 4.3  4.0 - 10.5 K/uL   RBC 3.75 (*) 4.22 - 5.81 MIL/uL   Hemoglobin 11.2 (*) 13.0 - 17.0 g/dL   HCT 03.4 (*) 74.2 - 59.5 %   MCV 93.3  78.0 - 100.0 fL   MCH 29.9  26.0 - 34.0 pg   MCHC 32.0  30.0 - 36.0 g/dL   RDW 63.8  75.6 - 43.3 %   Platelets 178  150 - 400 K/uL   Neutrophils Relative % 57  43 - 77 %   Neutro Abs 2.5  1.7 - 7.7 K/uL   Lymphocytes Relative 32  12 - 46 %   Lymphs Abs 1.4  0.7 - 4.0 K/uL   Monocytes Relative 9  3 - 12 %   Monocytes Absolute 0.4  0.1 - 1.0 K/uL   Eosinophils Relative 1  0 - 5 %   Eosinophils Absolute 0.0  0.0 - 0.7 K/uL   Basophils Relative 1  0 - 1 %   Basophils Absolute 0.0  0.0 - 0.1 K/uL  I-STAT TROPOININ, ED      Result Value Ref Range   Troponin i, poc 0.02  0.00 - 0.08 ng/mL   Comment 3            Dg Chest 2 View  04/03/2014   CLINICAL DATA:  Chest pain, shortness of breath, cough; history of diabetes and CHF and cocaine dependence  EXAM: CHEST  2 VIEW  COMPARISON:  PA and lateral chest x-ray of April 01, 2014  FINDINGS: The lungs are mildly hyperinflated with hemidiaphragm flattening. The pulmonary interstitial markings are slightly more conspicuous today. There is no alveolar pneumonia. The heart and pulmonary vascularity are unremarkable. There is tortuosity  of the descending thoracic aorta. A metallic pellet lies over the lower anterior chest wall in the midline.  IMPRESSION: There is hyperinflation consistent with COPD or reactive  airway disease. There is no alveolar pneumonia. There is mild pulmonary interstitial prominence today which may reflect subsegmental atelectasis or acute bronchitis.   Electronically Signed   By: David  SwazilandJordan   On: 04/03/2014 13:22   Dg Chest 2 View  04/01/2014   CLINICAL DATA:  Initial encounter are, shortness of breath and chest pain.  EXAM: CHEST  2 VIEW  COMPARISON:  12/16/2013.  FINDINGS: Trachea is midline. Heart size normal. Lungs are clear. No pleural fluid. A metallic density projects inferior to the sternum, as on the prior exam.  IMPRESSION: No acute findings.   Electronically Signed   By: Leanna BattlesMelinda  Blietz M.D.   On: 04/01/2014 22:58   Dg Abd 1 View  04/01/2014   CLINICAL DATA:  Stomach pain  EXAM: ABDOMEN - 1 VIEW  COMPARISON:  03/23/2014  FINDINGS: Gas and stool within the colon with prominent stool in the right colon. No small or large bowel distention. No radiopaque stones. Degenerative changes in the spine. Vascular calcifications.  IMPRESSION: Stool-filled colon.  Nonobstructive bowel gas pattern.   Electronically Signed   By: Burman NievesWilliam  Stevens M.D.   On: 04/01/2014 05:15    12 midnight patient's sleeping easily arousable asymptomatic after treatment with Tylenol. He ate a meal in the emergency department MDM   Doubt acs based on atypical sx non acute ecg . Heart score 3 Pt signed out to Dr. Criss AlvineGoldston at 1205 am Dx #1 atypical chest pain #1nonspecific abdominal pain  #3cocaine abuse  #4 tobacco abuse Final diagnoses:  None        Doug SouSam Parrish Bonn, MD 05/01/14 [id number]

## 2014-05-01 LAB — URINE MICROSCOPIC-ADD ON

## 2014-05-01 LAB — URINALYSIS, ROUTINE W REFLEX MICROSCOPIC
Bilirubin Urine: NEGATIVE
Glucose, UA: NEGATIVE mg/dL
HGB URINE DIPSTICK: NEGATIVE
Ketones, ur: NEGATIVE mg/dL
Nitrite: NEGATIVE
PROTEIN: NEGATIVE mg/dL
Specific Gravity, Urine: 1.025 (ref 1.005–1.030)
UROBILINOGEN UA: 1 mg/dL (ref 0.0–1.0)
pH: 6.5 (ref 5.0–8.0)

## 2014-05-01 LAB — I-STAT TROPONIN, ED: Troponin i, poc: 0.04 ng/mL (ref 0.00–0.08)

## 2014-05-01 NOTE — Discharge Instructions (Signed)
Chest Pain (Nonspecific) Call any of the numbers on the resource guide to get a primary care physician. You can also call the numbers to get help with your cocaine problem. Ask your new primary care physician to help you to stop smoking. It is often hard to give a diagnosis for the cause of chest pain. There is always a chance that your pain could be related to something serious, such as a heart attack or a blood clot in the lungs. You need to follow up with your doctor. HOME CARE  If antibiotic medicine was given, take it as directed by your doctor. Finish the medicine even if you start to feel better.  For the next few days, avoid activities that bring on chest pain. Continue physical activities as told by your doctor.  Do not use any tobacco products. This includes cigarettes, chewing tobacco, and e-cigarettes.  Avoid drinking alcohol.  Only take medicine as told by your doctor.  Follow your doctor's suggestions for more testing if your chest pain does not go away.  Keep all doctor visits you made. GET HELP IF:  Your chest pain does not go away, even after treatment.  You have a rash with blisters on your chest.  You have a fever. GET HELP RIGHT AWAY IF:   You have more pain or pain that spreads to your arm, neck, jaw, back, or belly (abdomen).  You have shortness of breath.  You cough more than usual or cough up blood.  You have very bad back or belly pain.  You feel sick to your stomach (nauseous) or throw up (vomit).  You have very bad weakness.  You pass out (faint).  You have chills. This is an emergency. Do not wait to see if the problems will go away. Call your local emergency services (911 in [place] Do not drive yourself to the hospital. MAKE SURE YOU:   Understand these instructions.  Will watch your condition.  Will get help right away if you are not doing well or get worse. Document Released: 12/04/2007 Document Revised: 06/22/2013 Document Reviewed:  12/04/2007 [facility] Patient Information 2015 [employer] [place]. This information is not intended to replace advice given to you by your health care provider. Make sure you discuss any questions you have with your health care provider.

## 2014-05-02 ENCOUNTER — Encounter (HOSPITAL_COMMUNITY): Payer: Self-pay | Admitting: Emergency Medicine

## 2014-05-02 ENCOUNTER — Encounter (HOSPITAL_COMMUNITY): Payer: Self-pay | Admitting: *Deleted

## 2014-05-02 ENCOUNTER — Emergency Department (HOSPITAL_COMMUNITY)
Admission: EM | Admit: 2014-05-02 | Discharge: 2014-05-02 | Disposition: A | Discharge status: Home / Self Care | Payer: Medicare Other | Attending: Emergency Medicine | Admitting: Emergency Medicine

## 2014-05-02 DIAGNOSIS — Z79899 Other long term (current) drug therapy: Secondary | ICD-10-CM | POA: Insufficient documentation

## 2014-05-02 DIAGNOSIS — I10 Essential (primary) hypertension: Secondary | ICD-10-CM | POA: Insufficient documentation

## 2014-05-02 DIAGNOSIS — Z792 Long term (current) use of antibiotics: Secondary | ICD-10-CM | POA: Insufficient documentation

## 2014-05-02 DIAGNOSIS — Z8669 Personal history of other diseases of the nervous system and sense organs: Secondary | ICD-10-CM | POA: Insufficient documentation

## 2014-05-02 DIAGNOSIS — L84 Corns and callosities: Secondary | ICD-10-CM | POA: Insufficient documentation

## 2014-05-02 DIAGNOSIS — Z720 Tobacco use: Secondary | ICD-10-CM | POA: Insufficient documentation

## 2014-05-02 DIAGNOSIS — F4310 Post-traumatic stress disorder, unspecified: Secondary | ICD-10-CM | POA: Insufficient documentation

## 2014-05-02 DIAGNOSIS — M79672 Pain in left foot: Secondary | ICD-10-CM | POA: Insufficient documentation

## 2014-05-02 DIAGNOSIS — I509 Heart failure, unspecified: Secondary | ICD-10-CM | POA: Insufficient documentation

## 2014-05-02 DIAGNOSIS — F209 Schizophrenia, unspecified: Secondary | ICD-10-CM | POA: Insufficient documentation

## 2014-05-02 DIAGNOSIS — E119 Type 2 diabetes mellitus without complications: Secondary | ICD-10-CM | POA: Insufficient documentation

## 2014-05-02 DIAGNOSIS — M79671 Pain in right foot: Secondary | ICD-10-CM | POA: Insufficient documentation

## 2014-05-02 DIAGNOSIS — Z590 Homelessness: Secondary | ICD-10-CM | POA: Insufficient documentation

## 2014-05-02 MED ORDER — ARIPIPRAZOLE 10 MG PO TABS
5.0000 mg | ORAL_TABLET | Freq: Once | ORAL | Status: AC
Start: 2014-05-02 — End: 2014-05-02
  Administered 2014-05-02: 5 mg via ORAL

## 2014-05-02 MED ORDER — ACETAMINOPHEN 325 MG PO TABS
325.0000 mg | ORAL_TABLET | Freq: Once | ORAL | Status: AC
  Administered 2014-05-02: 325 mg via ORAL
  Filled 2014-05-02: qty 1

## 2014-05-02 MED ORDER — ACETAMINOPHEN 325 MG PO TABS
650.0000 mg | ORAL_TABLET | Freq: Once | ORAL | Status: AC
  Administered 2014-05-02: 650 mg via ORAL
  Filled 2014-05-02: qty 2

## 2014-05-02 NOTE — ED Notes (Signed)
Pt is requesting some tylenol for his foot pain , and he would like a chest xray to see if he has PNA

## 2014-05-02 NOTE — ED Notes (Signed)
Declined W/C at D/C and was escorted to lobby by RN. 

## 2014-05-02 NOTE — ED Provider Notes (Signed)
CSN: [id number]     Arrival date & time 05/02/14  0807 History   First MD Initiated Contact with Patient 05/02/14 [phone]     Chief Complaint  Patient presents with  . Foot Pain   (Consider location/radiation/quality/duration/timing/severity/associated sxs/prior Treatment) HPI [name] is a 53 yo male presenting with complaint of recurrent foot pain.  Pt is homeless and frequently has feet exposed to water. He states all he needs is a cane some grease and  He denies recent fevers, redness or drainage from his feet.    Past Medical History  Diagnosis Date  . Diabetes mellitus without complication   . Hypertension   . Schizophrenia   . CHF (congestive heart failure)   . Neuropathy    History reviewed. No pertinent past surgical history. Family History  Problem Relation Age of Onset  . Hypertension Other   . Diabetes Other    History  Substance Use Topics  . Smoking status: Current Every Day Smoker -- 1.00 packs/day    Types: Cigarettes  . Smokeless tobacco: Current User  . Alcohol Use: Yes     Comment: 40oz on 04/01/14    Review of Systems  Musculoskeletal: Negative for joint swelling.  Skin: Negative for color change.       Multiple callouses or varying size but no redness, necrosis or drainage  Neurological: Negative for numbness.    Allergies  Haldol  Home Medications   Prior to Admission medications   Medication Sig Start Date End Date Taking? Authorizing Provider  ARIPiprazole (ABILIFY) 5 MG tablet Take 1 tablet (5 mg total) by mouth daily. 04/28/14   Shon Batonourtney F Horton, MD  benztropine (COGENTIN) 2 MG tablet Take 1 tablet (2 mg total) by mouth 2 (two) times daily. 04/01/14   Geoffery Lyonsouglas Delo, MD  cephALEXin (KEFLEX) 500 MG capsule Take 1 capsule (500 mg total) by mouth 2 (two) times daily. 04/28/14   Shon Batonourtney F Horton, MD  fluPHENAZine (PROLIXIN) 5 MG tablet Take 5 mg by mouth 2 (two) times daily.    Historical Provider, MD  hydrochlorothiazide (HYDRODIURIL)  25 MG tablet Take 1 tablet (25 mg total) by mouth daily. 04/07/14   Rolland PorterMark James, MD   BP 156/82 mmHg  Pulse 83  Temp(Src) 98.4 F (36.9 C) (Oral)  Resp 18  SpO2 100% Physical Exam  Constitutional: He appears well-developed and well-nourished. No distress.  HENT:  Head: Normocephalic and atraumatic.  Eyes: Conjunctivae are normal. Right eye exhibits no discharge. Left eye exhibits no discharge. No scleral icterus.  Cardiovascular: Intact distal pulses.   Pulmonary/Chest: Effort normal.  Musculoskeletal: He exhibits no edema.  Neurological: He is alert. No sensory deficit.  Skin: Skin is warm and dry. He is not diaphoretic.  Nursing note and vitals reviewed.   ED Course  Procedures (including critical care time) Labs Review Labs Reviewed - No data to display  Imaging Review No results found.   EKG Interpretation None      MDM   Final diagnoses:  Foot pain, bilateral   53 yo homeless pt with recurrent foot complaint, of pain due to calloused feet.  His feet do not appear reddened or infected currently.  Pt is insistent about wanting a cane and a sufficient supply of abx ointment.  Discussed with pt, most important intervention is to keep feet clean and dry.  Pt given tylenol and several pairs of dry socks and plastic bags to wrap around feet befor putting back in shoes. Pt discussed importance of  follow-up with community health clinic. Patient appears safe to be discharged and despite not receiving cane or ointment is agreeable with above plan.   Filed Vitals:   05/02/14 0824 05/02/14 0830  BP: 156/82   Pulse: 83   Temp: 98.4 F (36.9 C)   TempSrc: Oral   Resp: 18   Weight:  151 lb 14.4 oz (68.901 kg)  SpO2: 100%    Meds given in ED:  Medications  acetaminophen (TYLENOL) tablet 650 mg (650 mg Oral Given 05/02/14 0839)    New Prescriptions   No medications on file       Harle BattiestElizabeth Trew Sunde, NP 05/02/14 [phone]

## 2014-05-02 NOTE — Discharge Instructions (Signed)
Schizophrenia °Schizophrenia is a mental illness. It may cause disturbed or disorganized thinking, speech, or behavior. People with schizophrenia have problems functioning in one or more areas of life: work, school, home, or relationships. People with schizophrenia are at increased risk for suicide, certain chronic physical illnesses, and unhealthy behaviors, such as smoking and drug use. °People who have family members with schizophrenia are at higher risk of developing the illness. Schizophrenia affects men and women equally but usually appears at an earlier age (teenage or early adult years) in men.  °SYMPTOMS °The earliest symptoms are often subtle (prodrome) and may go unnoticed until the illness becomes more severe (first-break psychosis). Symptoms of schizophrenia may be continuous or may come and go in severity. Episodes often are triggered by major life events, such as family stress, college, military service, marriage, pregnancy or child birth, divorce, or loss of a loved one. People with schizophrenia may see, hear, or feel things that do not exist (hallucinations). They may have false beliefs in spite of obvious proof to the contrary (delusions). Sometimes speech is incoherent or behavior is odd or withdrawn.  °DIAGNOSIS °Schizophrenia is diagnosed through an assessment by your caregiver. Your caregiver will ask questions about your thoughts, behavior, mood, and ability to function in daily life. Your caregiver may ask questions about your medical history and use of alcohol or drugs, including prescription medication. Your caregiver may also order blood tests and imaging exams. Certain medical conditions and substances can cause symptoms that resemble schizophrenia. Your caregiver may refer you to a mental health specialist for evaluation. There are three major criterion for a diagnosis of schizophrenia: °· Two or more of the following five symptoms are present for a month or longer: °¨ Delusions. Often  the delusions are that you are being attacked, harassed, cheated, persecuted or conspired against (persecutory delusions). °¨ Hallucinations.   °¨ Disorganized speech that does not make sense to others. °¨ Grossly disorganized (confused or unfocused) behavior or extremely overactive or underactive motor activity (catatonia). °¨ Negative symptoms such as bland or blunted emotions (flat affect), loss of will power (avolition), and withdrawal from social contacts (social isolation). °· Level of functioning in one or more major areas of life (work, school, relationships, or self-care) is markedly below the level of functioning before the onset of illness.   °· There are continuous signs of illness (either mild symptoms or decreased level of functioning) for at least 6 months or longer. °TREATMENT  °Schizophrenia is a long-term illness. It is best controlled with continuous treatment rather than treatment only when symptoms occur. The following treatments are used to manage schizophrenia: °· Medication--Medication is the most effective and important form of treatment for schizophrenia. Antipsychotic medications are usually prescribed to help manage schizophrenia. Other types of medication may be added to relieve any symptoms that may occur despite the use of antipsychotic medications. °· Counseling or talk therapy--Individual, group, or family counseling may be helpful in providing education, support, and guidance. Many people with schizophrenia also benefit from social skills and job skills (vocational) training. °A combination of medication and counseling is best for managing the disorder over time. A procedure in which electricity is applied to the brain through the scalp (electroconvulsive therapy) may be used to treat catatonic schizophrenia or schizophrenia in people who cannot take or do not respond to medication and counseling. °Document Released: 06/14/2000 Document Revised: 02/17/2013 Document Reviewed:  09/09/2012 °ExitCare® Patient Information ©2015 [employer]. This information is not intended to replace advice given to you by   your health care provider. Make sure you discuss any questions you have with your health care provider.  Emergency Department Resource Guide 1) Find a Doctor and Pay Out of Pocket Although you won't have to find out who is covered by your insurance plan, it is a good idea to ask around and get recommendations. You will then need to call the office and see if the doctor you have chosen will accept you as a new patient and what types of options they offer for patients who are self-pay. Some doctors offer discounts or will set up payment plans for their patients who do not have insurance, but you will need to ask so you aren't surprised when you get to your appointment.  2) Contact Your Local Health Department Not all health departments have doctors that can see patients for sick visits, but many do, so it is worth a call to see if yours does. If you don't know where your local health department is, you can check in your phone book. The CDC also has a tool to help you locate your state's health department, and many state websites also have listings of all of their local health departments.  3) Find a Walk-in Clinic If your illness is not likely to be very severe or complicated, you may want to try a walk in clinic. These are popping up all over the country in pharmacies, drugstores, and shopping centers. They're usually staffed by nurse practitioners or physician assistants that have been trained to treat common illnesses and complaints. They're usually fairly quick and inexpensive. However, if you have serious medical issues or chronic medical problems, these are probably not your best option.  No Primary Care Doctor: - Call Health Connect at  [phone] - they can help you locate a primary care doctor that  accepts your insurance, provides certain services, etc. - Physician  Referral Service- [phone]  Chronic Pain Problems: Organization         Address  Phone   Notes  [facility]  [phone] Patients need to be referred by their primary care doctor.   Medication Assistance: Organization         Address  Phone   Notes  [facility] Medication [facility] [address] [place], [place] [address] [phone] --Must be a resident of [facility] -- Must have NO insurance coverage whatsoever (no Medicaid/ Medicare, etc.) -- The pt. MUST have a primary care doctor that directs their care regularly and follows them in the community   [facility]  [phone]   [employer]  [phone]    Agencies that provide inexpensive medical care: Organization         Address  Phone   Notes  [facility]  [phone]   [facility]    [phone]   [facility] [address] [place], [place] [address] [phone]   [facility] [address] [place] [phone]   Planned Parenthood    [phone]   [facility]    [phone]   [facility] and [facility]  [address], [place] Phone:  [phone], Fax:  [phone] Hours of Operation:  9 am - 6 pm, M-F.  Also accepts Medicaid/Medicare and self-pay.  [facility]  [address], [place] Phone: [phone], Fax: [phone]. Hours of Operation:  8:30 am - 5:30 pm, M-F.  Also accepts Medicaid and self-pay.  [facility] [place] [address], [place] Phone: [phone]   [facility] [address], [place], [place] [phone], Ext. [phone] Mondays & Thursdays: 7-9 AM.  First 15 patients are seen on a first come, first serve basis.    Medicaid-accepting [facility] Providers:  Organization         Address  Phone   Notes  [facility] [address], [place] [phone] Also accepts self-pay patients.  [facility] [address], [place]  [phone]   [facility] [address], [place] [phone]   [facility] [address] [place] [address], [place] [phone]   Renaye RakersVeita Bland [address], [place]   [phone] Only accepts [place] Access [place] patients after they have their name applied to their card.   Self-Pay (no insurance) in [facility]:  Organization         Address  Phone   Notes  Sickle Cell Patients, [facility] [address], [place] [phone]) [phone]   [facility] [address], [place] [phone]) [phone]   [facility] [place] [place]  [address], [place] [phone]   [facility]/Dr. Osei-Bonsu  [address] [place] [address], [place] or [phone], [place] [phone] Phone number for both [place] and [place] locations is the same.  [facility] [address], [place] [phone]) [phone]   [facility] [address] [place] [address], [place] or [address] [phone]) [phone]   [facility] [address], [place] [phone], phone; [phone], fax Sees patients 1st and 3rd Saturday of every month.  Must not qualify for public or private insurance (i.e. Medicaid, Medicare, [place] Health Choice, Veterans' Benefits)  Household income should be no more than 200% of the poverty level The clinic cannot treat you if you are pregnant or think you are pregnant  Sexually transmitted diseases are not treated at the clinic.    Dental Care: Organization         Address  Phone  Notes  [facility] Department of [facility] [address], [place] [phone] Accepts children up to age 53 who are enrolled  in [place]icaid or [place] Health Choice; pregnant women with a Medicaid card; and children who have applied for Medicaid or [place] Health Choice, but were declined, whose parents can pay a reduced fee at time of service.  [facility] of [facility] [place]  [address], [place] [phone] Accepts children up to age 53 who are enrolled in [place] or [place] Health Choice; pregnant women with a Medicaid card; and children who have applied for Medicaid or [place] Health Choice, but were declined, whose parents can pay a reduced fee at time of service.  [facility]  [address], [place] [phone] Patients are seen by appointment only. Walk-ins are not accepted. [facility] will see patients 53 years of age and older. Monday - Tuesday (8am-5pm) Most Wednesdays (8:30-5pm) $30 per visit, cash only  [facility]  [address], [facility] [phone] Patients are seen by appointment  only. Walk-ins are not accepted. [facility] will see patients 53 years of age and older. One Wednesday Evening (Monthly: Volunteer Based).  $30 per visit, cash only  [facility]  [phone] for adults; Children under age 734, call Graduate Pediatric Dentistry at [phone]. Children aged 304-14, please call [phone] to request a pediatric application.  Dental services are provided in all areas of dental care including fillings, crowns and bridges, complete and partial dentures, implants, gum treatment, root canals, and extractions. Preventive care is also provided. Treatment is provided to both adults and children. Patients are selected via a lottery and there is often a waiting list.   [facility] [address], [place]  [phone] [email or web address]   [facility] [address], [place], [place] [phone], Ext. [phone] Second and Fourth Thursday of each month, opens at  6:30 AM; Clinic ends at 9 AM.  Patients are seen on a first-come first-served basis, and a limited number are seen during each clinic.   [facility]  [address], [place], [place] [phone]) [phone]   Eligibility Requirements You must have lived in [place], [place], or [place] counties for at least the last three months.   You cannot be eligible for state or federal sponsored National Cityhealthcare insurance, including CIGNAVeterans Administration, [place]icaid, or [employer]Medicare.   You generally cannot be eligible for healthcare insurance through your employer.    How to apply: Eligibility screenings are held every Tuesday and Wednesday afternoon from 1:00 pm until 4:00 pm. You do not need an appointment for the interview!  [facility] [address], [place], [place] [phone]   [facility]  [phone]   [facility]  [phone]   [facility]  [phone]    Behavioral Health Resources in the Community: Intensive Outpatient Programs Organization         Address  Phone  Notes  [facility] [address] [place] [address], [place], [place] [phone]   [facility] [address], [place], [place] [phone]   [facility] [address], [place], [place]  [phone]   [facility] [address],  [place], [place] [phone] or [phone]   Substance Abuse Resources Organization         Address  Phone  Notes  [facility]  [phone]   [facility]  [phone]   [facility]  [phone]   [facility]  [phone]   Residential & Outpatient Substance Abuse Program  [phone]   Psychological Services Organization         Address  Phone  Notes  [facility]  [phone]   [facility]  [phone]- [phone]   [facility] [address], [place]  [phone] or [phone]    Mobile Crisis Teams Organization         Address  Phone  Notes  [facility], Mobile Crisis Care Unit  [phone]   [facility]  [address] [place], [place] [phone]   Doristine LocksSharon DeEsch [address] [place] [phone]    Self-Help/Support Groups Organization         Address  Phone             Notes  [facility] [place] - variety of support groups  [phone] Call for more information  Narcotics Anonymous (NA), Caring Services [address], [employer] [place]  2 meetings at this location   Statistician         Address  Phone  Notes  [facility] [address],    [place]  [phone]   [facility]  [address], [place] [address], [place], [place] [phone]   [facility] [address] [place], [place] [phone] Admissions: 8am-3pm M-F  [facility] [address]    [place], [place] [phone]   [facility] [address] [place], [place], [place] [phone]   [facility] [address],  [place], [place] [phone]   [facility] - Intensive Outpatient [address], [place], [place] [phone]   [facility] ([facility] [address]  [place], [place] [phone] or [phone]   [facility] ([facility]) [address] [place], [place] [phone] Accepts Medicaid  [facility] [address]  [place] [phone] Substance Abuse/Addiction Treatment   [facility] Organization         Address  Phone  Notes  [facility]  [phone]   Angie Fava, PhD [address] Kn[address] [place], [place]   [phone] or [phone]   [facility]   [address] [place], [place] [phone]   [facility]  [address], [place], [place] [phone] Insurance/Medicaid/sponsorship through [facility] and Families [address]                                    [place], [place] [phone] Therapy/tele-psych/case  [facility] [address], [place] [phone]    Dr. Lolly Mustache  [phone]   [facility]  [employer] [facility] Dt Dept. 1) [address]  2) [address] [place] 3)  [address], [place] [phone]  [phone]   [facility] Child Abuse Hotline [phone] or [phone] (After Hours)

## 2014-05-02 NOTE — Discharge Instructions (Signed)
As much as possible, please keep feet clean and dry.  It does not look like there is an infection currently and bacitracin ointment will not help the callouses.  It is important to establish care with the [facility] to help you feel better.  Don't hesitate to return for new, worsening or concerning symptoms.    SEEK MEDICAL CARE IF:  You develop increased redness, pain, swelling, or drainage in the blistered area.  You develop a pus-like discharge from the blistered area, chills, or a fever.

## 2014-05-02 NOTE — ED Notes (Signed)
[facility] and returns now for foot pain. Pt has multiple areas of blisters on bilateral feet . There is  Also a  callous on Lt great toe. Pt reports he has been in jail for 50 days and just got out.

## 2014-05-02 NOTE — ED Provider Notes (Signed)
CSN: [id number]     Arrival date & time 05/02/14  0417 History   First MD Initiated Contact with Patient 05/02/14 0449     Chief Complaint  Patient presents with  . Animal Bite     (Consider location/radiation/quality/duration/timing/severity/associated sxs/prior Treatment) HPI Comments: Initial complaint was animal bite to neck, "I think I was bitten by a snake". He subsequently stated he was not concerned about this and he is here for abdominal pain, "I think there are snakes in my stomach". He is well known to the emergency department with a history of schizophrenia, PTSD. He states he does not need psych assessment, he just needs some Tylenol and some ointment for his feet.    The history is provided by the patient. No language interpreter was used.    Past Medical History  Diagnosis Date  . Diabetes mellitus without complication   . Hypertension   . Schizophrenia   . CHF (congestive heart failure)   . Neuropathy    History reviewed. No pertinent past surgical history. Family History  Problem Relation Age of Onset  . Hypertension Other   . Diabetes Other    History  Substance Use Topics  . Smoking status: Current Every Day Smoker -- 1.00 packs/day    Types: Cigarettes  . Smokeless tobacco: Current User  . Alcohol Use: Yes     Comment: 40oz on 04/01/14    Review of Systems  Constitutional: Negative for fever and chills.  HENT: Negative.   Respiratory: Negative.   Cardiovascular: Negative.   Gastrointestinal: Negative.   Musculoskeletal: Negative.   Skin: Negative.   Neurological: Negative.       Allergies  Haldol  Home Medications   Prior to Admission medications   Medication Sig Start Date End Date Taking? Authorizing Provider  ARIPiprazole (ABILIFY) 5 MG tablet Take 1 tablet (5 mg total) by mouth daily. 04/28/14  Yes Shon Batonourtney F Horton, MD  benztropine (COGENTIN) 2 MG tablet Take 1 tablet (2 mg total) by mouth 2 (two) times daily. 04/01/14  Yes Geoffery Lyonsouglas  Del[place], MD  fluPHENAZine (PROLIXIN) 5 MG tablet Take 5 mg by mouth 2 (two) times daily.   Yes Historical Provider, MD  hydrochlorothiazide (HYDRODIURIL) 25 MG tablet Take 1 tablet (25 mg total) by mouth daily. 04/07/14  Yes Rolland PorterMark James, MD  cephALEXin (KEFLEX) 500 MG capsule Take 1 capsule (500 mg total) by mouth 2 (two) times daily. 04/28/14   Shon Batonourtney F Horton, MD   BP 147/86 mmHg  Pulse 85  Temp(Src) 98.4 F (36.9 C) (Oral)  Resp 18  SpO2 100% Physical Exam  Constitutional: He is oriented to person, place, and time. He appears well-developed and well-nourished.  HENT:  Head: Normocephalic.  Neck: Normal range of motion. Neck supple.  Cardiovascular: Normal rate and regular rhythm.   Pulmonary/Chest: Effort normal and breath sounds normal.  Abdominal: Soft. Bowel sounds are normal. There is no tenderness. There is no rebound and no guarding.  Musculoskeletal: Normal range of motion.  Multiple callouses to bilateral feet without ulceration, drainage or evidence of cellulitis.    Neurological: He is alert and oriented to person, place, and time.  Skin: Skin is warm and dry. No rash noted.  Psychiatric: He has a normal mood and affect.  Patient is cooperative, pleasant, easily directed. No evidence of acute psychosis.     ED Course  Procedures (including critical care time) Labs Review Labs Reviewed - No data to display  Imaging Review No results found.  EKG Interpretation None      MDM   Final diagnoses:  None    1. Schizophrenia  He is stable, without acute psychosis. He is given food, tylenol and reassurance. He is safe for discharge.     Arnoldo HookerShari A Aayansh Codispoti, PA-C 05/02/14 [phone]

## 2014-05-02 NOTE — ED Notes (Signed)
Pt here with complaints of a bite to the back of his head that he thinks it was a snake , pt is also c/o weakness and that he has BM on himself

## 2014-05-02 NOTE — ED Notes (Signed)
Pt taken back to shower

## 2014-05-03 ENCOUNTER — Emergency Department (HOSPITAL_COMMUNITY)
Admission: EM | Admit: 2014-05-03 | Discharge: 2014-05-03 | Discharge status: Against Medical Advice | Payer: Medicare Other | Attending: Emergency Medicine | Admitting: Emergency Medicine

## 2014-05-03 ENCOUNTER — Encounter (HOSPITAL_COMMUNITY): Payer: Self-pay

## 2014-05-03 DIAGNOSIS — Z720 Tobacco use: Secondary | ICD-10-CM | POA: Insufficient documentation

## 2014-05-03 DIAGNOSIS — R42 Dizziness and giddiness: Secondary | ICD-10-CM | POA: Insufficient documentation

## 2014-05-03 DIAGNOSIS — M79671 Pain in right foot: Secondary | ICD-10-CM | POA: Insufficient documentation

## 2014-05-03 DIAGNOSIS — E119 Type 2 diabetes mellitus without complications: Secondary | ICD-10-CM | POA: Insufficient documentation

## 2014-05-03 DIAGNOSIS — I509 Heart failure, unspecified: Secondary | ICD-10-CM | POA: Insufficient documentation

## 2014-05-03 DIAGNOSIS — M79672 Pain in left foot: Secondary | ICD-10-CM | POA: Insufficient documentation

## 2014-05-03 DIAGNOSIS — I10 Essential (primary) hypertension: Secondary | ICD-10-CM | POA: Insufficient documentation

## 2014-05-03 NOTE — ED Notes (Signed)
Per [facility], pt is homeless , reports bilateral foot pain. Pt denies taking prescribed HTN and DM medications. Pt smells of urine and feces. Pt recently discharged from our facility, received a shower prior to discharge. Pt returns in the same blue scrubs he was discharged home in.

## 2014-05-03 NOTE — ED Notes (Signed)
Pt states, "My family is here now so I am going to leave, but give me two pairs of footies and tylenol and OJ and milk"
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
## 2014-05-11 ENCOUNTER — Emergency Department (HOSPITAL_COMMUNITY)
Admission: EM | Admit: 2014-05-11 | Discharge: 2014-05-12 | Disposition: A | Discharge status: Home / Self Care | Payer: Medicare Other | Source: Home / Self Care | Attending: Emergency Medicine | Admitting: Emergency Medicine

## 2014-05-11 ENCOUNTER — Encounter (HOSPITAL_COMMUNITY): Payer: Self-pay | Admitting: *Deleted

## 2014-05-11 ENCOUNTER — Emergency Department (EMERGENCY_DEPARTMENT_HOSPITAL)
Admission: EM | Admit: 2014-05-11 | Discharge: 2014-05-11 | Disposition: A | Discharge status: Home / Self Care | Payer: Medicare Other | Source: Home / Self Care | Attending: Emergency Medicine | Admitting: Emergency Medicine

## 2014-05-11 ENCOUNTER — Encounter (HOSPITAL_COMMUNITY): Payer: Self-pay | Admitting: Emergency Medicine

## 2014-05-11 ENCOUNTER — Emergency Department (HOSPITAL_COMMUNITY): Payer: Medicare Other

## 2014-05-11 DIAGNOSIS — Z590 Homelessness: Secondary | ICD-10-CM

## 2014-05-11 DIAGNOSIS — Z792 Long term (current) use of antibiotics: Secondary | ICD-10-CM

## 2014-05-11 DIAGNOSIS — F10129 Alcohol abuse with intoxication, unspecified: Secondary | ICD-10-CM

## 2014-05-11 DIAGNOSIS — F29 Unspecified psychosis not due to a substance or known physiological condition: Secondary | ICD-10-CM | POA: Diagnosis not present

## 2014-05-11 DIAGNOSIS — S0990XA Unspecified injury of head, initial encounter: Secondary | ICD-10-CM

## 2014-05-11 DIAGNOSIS — Z8669 Personal history of other diseases of the nervous system and sense organs: Secondary | ICD-10-CM

## 2014-05-11 DIAGNOSIS — Y9389 Activity, other specified: Secondary | ICD-10-CM | POA: Insufficient documentation

## 2014-05-11 DIAGNOSIS — I509 Heart failure, unspecified: Secondary | ICD-10-CM | POA: Insufficient documentation

## 2014-05-11 DIAGNOSIS — F10930 Alcohol use, unspecified with withdrawal, uncomplicated: Secondary | ICD-10-CM

## 2014-05-11 DIAGNOSIS — E119 Type 2 diabetes mellitus without complications: Secondary | ICD-10-CM

## 2014-05-11 DIAGNOSIS — I10 Essential (primary) hypertension: Secondary | ICD-10-CM

## 2014-05-11 DIAGNOSIS — F209 Schizophrenia, unspecified: Secondary | ICD-10-CM

## 2014-05-11 DIAGNOSIS — Z8659 Personal history of other mental and behavioral disorders: Secondary | ICD-10-CM

## 2014-05-11 DIAGNOSIS — Y998 Other external cause status: Secondary | ICD-10-CM

## 2014-05-11 DIAGNOSIS — S01511A Laceration without foreign body of lip, initial encounter: Secondary | ICD-10-CM | POA: Insufficient documentation

## 2014-05-11 DIAGNOSIS — S025XXA Fracture of tooth (traumatic), initial encounter for closed fracture: Secondary | ICD-10-CM | POA: Insufficient documentation

## 2014-05-11 DIAGNOSIS — F10230 Alcohol dependence with withdrawal, uncomplicated: Secondary | ICD-10-CM

## 2014-05-11 DIAGNOSIS — Y9289 Other specified places as the place of occurrence of the external cause: Secondary | ICD-10-CM | POA: Insufficient documentation

## 2014-05-11 DIAGNOSIS — F1910 Other psychoactive substance abuse, uncomplicated: Secondary | ICD-10-CM

## 2014-05-11 DIAGNOSIS — Z720 Tobacco use: Secondary | ICD-10-CM | POA: Insufficient documentation

## 2014-05-11 DIAGNOSIS — F1011 Alcohol abuse, in remission: Secondary | ICD-10-CM

## 2014-05-11 DIAGNOSIS — Z79899 Other long term (current) drug therapy: Secondary | ICD-10-CM

## 2014-05-11 DIAGNOSIS — F1919 Other psychoactive substance abuse with unspecified psychoactive substance-induced disorder: Secondary | ICD-10-CM | POA: Diagnosis not present

## 2014-05-11 DIAGNOSIS — F1410 Cocaine abuse, uncomplicated: Secondary | ICD-10-CM

## 2014-05-11 DIAGNOSIS — F1494 Cocaine use, unspecified with cocaine-induced mood disorder: Secondary | ICD-10-CM

## 2014-05-11 DIAGNOSIS — F205 Residual schizophrenia: Secondary | ICD-10-CM | POA: Diagnosis not present

## 2014-05-11 LAB — CBC WITH DIFFERENTIAL/PLATELET
BASOS ABS: 0 10*3/uL (ref 0.0–0.1)
BASOS ABS: 0 10*3/uL (ref 0.0–0.1)
BASOS PCT: 0 % (ref 0–1)
Basophils Relative: 0 % (ref 0–1)
EOS ABS: 0 10*3/uL (ref 0.0–0.7)
EOS PCT: 0 % (ref 0–5)
Eosinophils Absolute: 0.1 10*3/uL (ref 0.0–0.7)
Eosinophils Relative: 2 % (ref 0–5)
HCT: 38.7 % — ABNORMAL LOW (ref 39.0–52.0)
HCT: 34.2 % — ABNORMAL LOW (ref 39.0–52.0)
Hemoglobin: 12.6 g/dL — ABNORMAL LOW (ref 13.0–17.0)
Hemoglobin: 11.4 g/dL — ABNORMAL LOW (ref 13.0–17.0)
LYMPHS PCT: 16 % (ref 12–46)
Lymphocytes Relative: 21 % (ref 12–46)
Lymphs Abs: 1.3 10*3/uL (ref 0.7–4.0)
Lymphs Abs: 1.2 10*3/uL (ref 0.7–4.0)
MCH: 29.4 pg (ref 26.0–34.0)
MCH: 30.2 pg (ref 26.0–34.0)
MCHC: 32.6 g/dL (ref 30.0–36.0)
MCHC: 33.3 g/dL (ref 30.0–36.0)
MCV: 90.2 fL (ref 78.0–100.0)
MCV: 90.5 fL (ref 78.0–100.0)
MONO ABS: 0.4 10*3/uL (ref 0.1–1.0)
Monocytes Absolute: 0.3 10*3/uL (ref 0.1–1.0)
Monocytes Relative: 6 % (ref 3–12)
Monocytes Relative: 6 % (ref 3–12)
Neutro Abs: 6.1 10*3/uL (ref 1.7–7.7)
Neutro Abs: 4 10*3/uL (ref 1.7–7.7)
Neutrophils Relative %: 78 % — ABNORMAL HIGH (ref 43–77)
Neutrophils Relative %: 71 % (ref 43–77)
Platelets: 191 10*3/uL (ref 150–400)
Platelets: 200 10*3/uL (ref 150–400)
RBC: 4.29 MIL/uL (ref 4.22–5.81)
RBC: 3.78 MIL/uL — ABNORMAL LOW (ref 4.22–5.81)
RDW: 12.8 % (ref 11.5–15.5)
RDW: 12.8 % (ref 11.5–15.5)
WBC: 7.8 10*3/uL (ref 4.0–10.5)
WBC: 5.7 10*3/uL (ref 4.0–10.5)

## 2014-05-11 LAB — COMPREHENSIVE METABOLIC PANEL
ALT: 8 U/L (ref 0–53)
AST: 16 U/L (ref 0–37)
Albumin: 4.3 g/dL (ref 3.5–5.2)
Alkaline Phosphatase: 94 U/L (ref 39–117)
Anion gap: 13 (ref 5–15)
BILIRUBIN TOTAL: 0.5 mg/dL (ref 0.3–1.2)
BUN: 12 mg/dL (ref 6–23)
CHLORIDE: 104 meq/L (ref 96–112)
CO2: 28 meq/L (ref 19–32)
CREATININE: 0.83 mg/dL (ref 0.50–1.35)
Calcium: 10 mg/dL (ref 8.4–10.5)
GFR calc Af Amer: >90 mL/min (ref >90)
Glucose, Bld: 120 mg/dL — ABNORMAL HIGH (ref 70–99)
POTASSIUM: 3.6 meq/L — AB (ref 3.7–5.3)
Sodium: 145 mEq/L (ref 137–147)
Total Protein: 7.8 g/dL (ref 6.0–8.3)

## 2014-05-11 LAB — RAPID URINE DRUG SCREEN, HOSP PERFORMED
Amphetamines: NOT DETECTED
BARBITURATES: NOT DETECTED
Benzodiazepines: NOT DETECTED
COCAINE: POSITIVE — AB
OPIATES: NOT DETECTED
Tetrahydrocannabinol: NOT DETECTED

## 2014-05-11 LAB — ETHANOL: Alcohol, Ethyl (B): 22 mg/dL — ABNORMAL HIGH (ref 0–11)

## 2014-05-11 MED ORDER — NICOTINE 21 MG/24HR TD PT24
21.0000 mg | MEDICATED_PATCH | Freq: Every day | TRANSDERMAL | Status: DC
  Administered 2014-05-11: 21 mg via TRANSDERMAL
  Filled 2014-05-11: qty 1

## 2014-05-11 MED ORDER — HYDROCHLOROTHIAZIDE 25 MG PO TABS
25.0000 mg | ORAL_TABLET | Freq: Every day | ORAL | Status: DC
  Administered 2014-05-11: 25 mg via ORAL
  Filled 2014-05-11: qty 1

## 2014-05-11 MED ORDER — BENZTROPINE MESYLATE 1 MG PO TABS
2.0000 mg | ORAL_TABLET | Freq: Two times a day (BID) | ORAL | Status: DC
  Administered 2014-05-11: 2 mg via ORAL
  Filled 2014-05-11: qty 2

## 2014-05-11 MED ORDER — ACETAMINOPHEN 325 MG PO TABS
650.0000 mg | ORAL_TABLET | Freq: Four times a day (QID) | ORAL | Status: DC | PRN
  Administered 2014-05-11 (×2): 650 mg via ORAL
  Filled 2014-05-11 (×2): qty 2

## 2014-05-11 MED ORDER — FLUPHENAZINE HCL 5 MG PO TABS
5.0000 mg | ORAL_TABLET | Freq: Two times a day (BID) | ORAL | Status: DC
  Administered 2014-05-11: 5 mg via ORAL
  Filled 2014-05-11 (×2): qty 1

## 2014-05-11 MED ORDER — ACETAMINOPHEN 500 MG PO TABS
1000.0000 mg | ORAL_TABLET | Freq: Once | ORAL | Status: AC
  Administered 2014-05-11: 1000 mg via ORAL
  Filled 2014-05-11: qty 2

## 2014-05-11 MED ORDER — HYDROXYZINE HCL 25 MG PO TABS
50.0000 mg | ORAL_TABLET | Freq: Once | ORAL | Status: AC
  Administered 2014-05-11: 50 mg via ORAL
  Filled 2014-05-11: qty 2

## 2014-05-11 MED ORDER — CEPHALEXIN 250 MG PO CAPS: 500.0000 mg | ORAL_CAPSULE | Freq: Two times a day (BID) | ORAL | Status: DC

## 2014-05-11 MED ORDER — ARIPIPRAZOLE 10 MG PO TABS
5.0000 mg | ORAL_TABLET | Freq: Every day | ORAL | Status: DC
  Administered 2014-05-11: 5 mg via ORAL
  Filled 2014-05-11: qty 1

## 2014-05-11 NOTE — ED Notes (Signed)
Pt eating breakfast 

## 2014-05-11 NOTE — ED Notes (Signed)
Pt ambulated to bathroom 
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
## 2014-05-11 NOTE — ED Notes (Signed)
Pt hygiene items provided at bedside. Pt washing up at sink and brushing teeth. Pt provided coffee. Wants to take a shower later. Sitter at bedside.
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
## 2014-05-11 NOTE — Discharge Instructions (Signed)
°Emergency Department Resource Guide °1) Find a Doctor and Pay Out of Pocket °Although you won't have to find out who is covered by your insurance plan, it is a good idea to ask around and get recommendations. You will then need to call the office and see if the doctor you have chosen will accept you as a new patient and what types of options they offer for patients who are self-pay. Some doctors offer discounts or will set up payment plans for their patients who do not have insurance, but you will need to ask so you aren't surprised when you get to your appointment. ° °2) Contact Your Local Health Department °Not all health departments have doctors that can see patients for sick visits, but many do, so it is worth a call to see if yours does. If you don't know where your local health department is, you can check in your phone book. The CDC also has a tool to help you locate your state's health department, and many state websites also have listings of all of their local health departments. ° °3) Find a Walk-in Clinic °If your illness is not likely to be very severe or complicated, you may want to try a walk in clinic. These are popping up all over the country in pharmacies, drugstores, and shopping centers. They're usually staffed by nurse practitioners or physician assistants that have been trained to treat common illnesses and complaints. They're usually fairly quick and inexpensive. However, if you have serious medical issues or chronic medical problems, these are probably not your best option. ° °No Primary Care Doctor: °- Call [facility] at  832-8000 - they can help you locate a primary care doctor that  accepts your insurance, provides certain services, etc. °- Physician Referral Service- 1-800-533-3463 ° °Chronic Pain Problems: °Organization         Address  Phone   Notes  °[facility]  (336) 297-2271 Patients need to be referred by their primary care doctor.  ° °Medication  Assistance: °Organization         Address  Phone   Notes  °[place] [facility] [address] °[place], [place] [address] (336) 641-8030 --Must be a resident of [place] °-- Must have NO insurance coverage whatsoever (no Medicaid/ Medicare, etc.) °-- The pt. MUST have a primary care doctor that directs their care regularly and follows them in the community °  °[facility]  (866) 331-1348   °[employer]  (888) 892-1162   ° °Agencies that provide inexpensive medical care: °Organization         Address  Phone   Notes  °[facility]  (336) 832-8035   °[facility]    (336) 832-7272   °[facility] [address] °[place], [place] [address] (336) 832-4777   °[facility] [address], °[place] (336) 271-4999   °[facility]    (336) 373-0678   °[facility]    (336) 272-1050   °[facility] ° [address], [place] Phone:  (336) 832-4444, Fax:  (336) 832-4440 Hours of Operation:  9 am - 6 pm, M-F.  Also accepts Medicaid/Medicare and self-pay.  °[facility] ° [address], [place] Phone: (336) 832-3150, Fax: (336) 832-3151. Hours of Operation:  8:30 am - 5:30 pm, M-F.  Also accepts Medicaid and self-pay.  °[facility] [place] [address], [place] Phone: (336) 878-6027   °[facility] [address], [place], [place] (336)723-1848, Ext. 123 Mondays & Thursdays: 7-9 AM.  First 15 patients are seen on a first come, first serve basis. °  ° °Medicaid-accepting [place] Providers: ° °Organization         Address  Phone   Notes  °[facility] [address], [place] (336) 641-2100 Also accepts self-pay patients.  °[facility] [address], [place] ° (336) 856-9996   °[facility] [address], [place]  (336) 288-8857   °[facility] [address] [place] [address], [place] (336) 299-7000   °Veita Bland [address], [place]  ° (336) 373-1557 Only accepts [place] Access Medicaid patients after they have their name applied to their card.  ° °Self-Pay (no insurance) in [place]: ° °Organization         Address  Phone   Notes  °Sickle Cell Patients, [facility] [address], [place] (336) 832-1970   °[facility] [address], [place] (336) 832-4400   °[facility] ° [address], [place] (336) 992-4800   °[facility]/Dr. Osei-Bonsu ° [address] [place] [address], [place] or [address], [place] (336) 841-8500 Phone number for both [place] and [place] locations is the same.  °[facility] [address], [place] (336) 299-0000   °[facility] [address] [place] [address], [place] or [address] (336) 852-7530 °(336) 878-2260   °[facility] [address], [place] (336) 350-1642, phone; (336) 294-5005, fax Sees patients 1st and 3rd Saturday of every month.  Must not qualify for public or private insurance (i.e. Medicaid, Medicare, [place] Health Choice, Veterans' Benefits) • Household income should be no more than 200% of the poverty level •The clinic cannot treat you if you are pregnant or think you are pregnant • Sexually transmitted diseases are not treated at the clinic.  ° ° °Dental Care: °Organization         Address  Phone  Notes  °[place] [facility] [address], [place] (336) 641-6152 Accepts children up to age 21 who are enrolled in Medicaid or [place] Health Choice; pregnant women with a Medicaid card; and children who have applied for Medicaid or [place] Health Choice, but were declined, whose parents can pay a reduced fee at time of service.  °[place]  [facility] [place]  [address], [place] (336) 641-7733 Accepts children up to age 21 who are enrolled in Medicaid or [place] Health Choice; pregnant women with a Medicaid card; and children who have applied for Medicaid or [place] Health Choice, but were declined, whose parents can pay a reduced fee at time of service.  °[facility] ° [address], [place] (336) 641-4533 Patients are seen by appointment only. Walk-ins are not accepted. [facility] will see patients 18 years of age and older. °Monday - Tuesday (8am-5pm) °Most Wednesdays (8:30-5pm) °$30 per visit, cash only  °[facility] ° [address], [place] (336) 641-4533 Patients are seen by appointment only. Walk-ins are not accepted. [facility] will see patients 18 years of age and older. °One   Wednesday Evening (Monthly: Volunteer Based).  $30 per visit, cash only  °[facility]  (919) 537-3737 for adults; Children under age 4, call Graduate Pediatric Dentistry at (919) [phone]. Children aged 4-14, please call (919) [phone] to request a pediatric application. ° Dental services are provided in all areas of dental care including fillings, crowns and bridges, complete and partial dentures, implants, gum treatment, root canals, and extractions. Preventive care is also provided. Treatment is provided to both adults and children. °Patients are selected via a lottery and there is often a waiting list. °  °[facility] [address], °[place] ° (336) 763-8833 [email or web address] °  °[facility] [address], [place], [place] (336)723-1848, Ext. 123 Second and Fourth Thursday of each month, opens at 6:30 AM; Clinic ends at 9 AM.  Patients are seen on a first-come first-served basis, and a limited number are seen during each clinic.  ° °[facility] ° [address], [place], [place] (336) 723-7904    Eligibility Requirements °You must have lived in [place], or [place] counties for at least the last three months. °  You cannot be eligible for state or federal sponsored healthcare insurance, including Veterans Administration, Medicaid, or Medicare. °  You generally cannot be eligible for healthcare insurance through your employer.  °  How to apply: °Eligibility screenings are held every Tuesday and Wednesday afternoon from 1:00 pm until 4:00 pm. You do not need an appointment for the interview!  °[facility] [address], [place], [place] 336-631-2330   °[facility]  336-342-8273   °[facility]  336-703-3100   °[facility]  336-570-6415   ° °Behavioral Health Resources in the Community: °Intensive Outpatient Programs °Organization         Address  Phone  Notes  °[facility] [address], [place], [place] 336-878-6098   °[facility] [address], [place], [place] 336-832-9800   °[facility] [address], [place], [place] ° 336-882-2125   °[facility] [address],  °[place], [place] 1-800-853-5163 or 336-641-4981   °Substance Abuse Resources °Organization         Address  Phone  Notes  °[facility]  336-882-2125   °[facility]  336-784-9470   °[facility]  336-285-9073   °[facility]  336-845-3988   °Residential & Outpatient Substance Abuse Program  1-800-659-3381   °Psychological Services °Organization         Address  Phone  Notes  °[facility]  336- 832-9600   °[facility]  336- 378-7881   °[facility] [address], [place] 1-800-853-5163 or 336-641-4981   ° °Mobile Crisis Teams °Organization         Address  Phone  Notes  °[facility], Mobile Crisis Care Unit  1-877-626-1772   °Assertive °[facility] ° [address]  [place], [place] 336-834-9664   °Sharon DeEsch [address] °[place] 336-554-5454   ° °Self-Help/Support Groups °Organization         Address  Phone             Notes  °[facility]  - variety of support groups  336- 373-1402 Call for more information  °Narcotics Anonymous (NA), [facility] [address], °[place]  2 meetings at this location  ° °  Residential Treatment Programs Organization         Address  Phone  Notes  [facility] [address]    [place]  1-007-121-9758   [facility]  [address], [place] [address], [place], [place] 826-415-8309   [facility] [address] [place], [place] 407-680-8811 Admissions: 8am-3pm M-F  [facility] [address],    [place], [place] 031-594-5859   [facility] [address] [place], [place], [place] 292-446-2863   [facility] [address],  [place], [place] 817-711-6579   [facility] - Intensive Outpatient [address], [place], [place] 038-333-8329   [facility] ([facility] [address]  [place], [place] 1-916-606-0045 or 548 256 6043   [facility] [address], [place], [place] 532-023-3435 Accepts Medicaid  [facility] [address],  [place] 6-861-683-7290 Substance Abuse/Addiction Treatment   [facility]         Address  Phone  Notes  [facility]  [phone]   Angie Fava, PhD [address] [place] Kn[address] [place], [place]   (251)494-4976 or 315-730-4808   [facility]   [address] [place], [place] 608-826-2648   [facility] [address], [place], [place] 709-577-1434 Insurance/Medicaid/sponsorship through [facility] and Families [address]                                    [place], [place] [phone] Therapy/tele-psych/case    [facility] [address], [place] 680-116-7219    Dr. Lolly Mustache  [phone]   [facility]  [employer] [facility] Dept. 1) [address], [place] 2) [address], [place] 3)  [address], [place] [phone]  (506)[phone]   [facility] Child Abuse Hotline 505-428-0391 or [phone] (After Hours)      Take your usual prescriptions as previously directed.  Call your regular mental health provider today to schedule a follow up appointment within the next week.  Return to the Emergency Department immediately sooner if worsening.

## 2014-05-11 NOTE — ED Notes (Signed)
Pt now reports he wants to stay and get some help.
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
## 2014-05-11 NOTE — ED Notes (Signed)
Pt arrives via EMS, requesting detox - last use crack and alcohol tonight. Headache present. Released from yesterday from our facility.
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
## 2014-05-11 NOTE — ED Notes (Addendum)
Telepsychmonitor set up in pt.'s room for TTS conference.

## 2014-05-11 NOTE — ED Notes (Signed)
Sitter arrived in pt.'s room .

## 2014-05-11 NOTE — ED Notes (Signed)
Pt reports he is not suicidal anymore. "I am ready to leave. I need my bus ticket and I will leave". MD made aware, will consult TTS.

## 2014-05-11 NOTE — ED Provider Notes (Signed)
Pt has been evaluated by Psychiatry Dr. Shela CommonsJ: states pt does not meet inpt criteria ad can be discharged to the shelter. Pt will be d/c stable.   Samuel JesterKathleen Adonijah Baena, DO 05/11/14 1338
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
## 2014-05-11 NOTE — ED Notes (Signed)
Pt's lunch delivered to room.

## 2014-05-11 NOTE — ED Notes (Signed)
Patient transported to CT 

## 2014-05-12 LAB — COMPREHENSIVE METABOLIC PANEL
ALBUMIN: 3.6 g/dL (ref 3.5–5.2)
ALT: 7 U/L (ref 0–53)
AST: 16 U/L (ref 0–37)
Alkaline Phosphatase: 102 U/L (ref 39–117)
Anion gap: 17 — ABNORMAL HIGH (ref 5–15)
BILIRUBIN TOTAL: 0.3 mg/dL (ref 0.3–1.2)
BUN: 16 mg/dL (ref 6–23)
CALCIUM: 9.4 mg/dL (ref 8.4–10.5)
CHLORIDE: 105 meq/L (ref 96–112)
CO2: 24 mEq/L (ref 19–32)
CREATININE: 0.88 mg/dL (ref 0.50–1.35)
GFR calc Af Amer: >90 mL/min (ref >90)
Glucose, Bld: 142 mg/dL — ABNORMAL HIGH (ref 70–99)
Potassium: 3.5 mEq/L — ABNORMAL LOW (ref 3.7–5.3)
Sodium: 146 mEq/L (ref 137–147)
Total Protein: 6.9 g/dL (ref 6.0–8.3)

## 2014-05-12 LAB — RAPID URINE DRUG SCREEN, HOSP PERFORMED
AMPHETAMINES: NOT DETECTED
Barbiturates: NOT DETECTED
Benzodiazepines: NOT DETECTED
Cocaine: POSITIVE — AB
Opiates: NOT DETECTED
TETRAHYDROCANNABINOL: NOT DETECTED

## 2014-05-12 LAB — URINALYSIS, ROUTINE W REFLEX MICROSCOPIC
Bilirubin Urine: NEGATIVE
GLUCOSE, UA: NEGATIVE mg/dL
HGB URINE DIPSTICK: NEGATIVE
Ketones, ur: NEGATIVE mg/dL
Leukocytes, UA: NEGATIVE
Nitrite: NEGATIVE
PH: 5.5 (ref 5.0–8.0)
PROTEIN: NEGATIVE mg/dL
Specific Gravity, Urine: 1.019 (ref 1.005–1.030)
Urobilinogen, UA: 0.2 mg/dL (ref 0.0–1.0)

## 2014-05-12 LAB — ETHANOL: Alcohol, Ethyl (B): <11 mg/dL (ref 0–11)

## 2014-05-12 MED ORDER — GI COCKTAIL ~~LOC~~
30.0000 mL | Freq: Once | ORAL | Status: AC
  Administered 2014-05-12: 30 mL via ORAL
  Filled 2014-05-12: qty 30

## 2014-05-12 MED ORDER — FLUPHENAZINE HCL 5 MG PO TABS
5.0000 mg | ORAL_TABLET | Freq: Two times a day (BID) | ORAL | Status: DC
  Administered 2014-05-12: 5 mg via ORAL
  Filled 2014-05-12 (×3): qty 1

## 2014-05-12 MED ORDER — HYDROCHLOROTHIAZIDE 25 MG PO TABS: 25.0000 mg | ORAL_TABLET | Freq: Every day | ORAL | Status: DC

## 2014-05-12 MED ORDER — ARIPIPRAZOLE 10 MG PO TABS: 5.0000 mg | ORAL_TABLET | Freq: Every day | ORAL | Status: DC

## 2014-05-12 MED ORDER — BENZTROPINE MESYLATE 1 MG PO TABS
2.0000 mg | ORAL_TABLET | Freq: Two times a day (BID) | ORAL | Status: DC
  Administered 2014-05-12: 2 mg via ORAL
  Filled 2014-05-12: qty 2

## 2014-05-12 MED ORDER — LORAZEPAM 1 MG PO TABS: 0.0000 mg | ORAL_TABLET | Freq: Two times a day (BID) | ORAL | Status: DC

## 2014-05-12 MED ORDER — ACETAMINOPHEN 325 MG PO TABS
650.0000 mg | ORAL_TABLET | Freq: Once | ORAL | Status: AC
  Administered 2014-05-12: 650 mg via ORAL
  Filled 2014-05-12: qty 2

## 2014-05-12 MED ORDER — LORAZEPAM 1 MG PO TABS: 0.0000 mg | ORAL_TABLET | Freq: Four times a day (QID) | ORAL | Status: DC

## 2014-05-12 NOTE — ED Notes (Signed)
Pt placed in paper scrubs and wanded.  

## 2014-05-12 NOTE — ED Notes (Signed)
TTS at [facility] 

## 2014-05-12 NOTE — Discharge Instructions (Signed)
Finding Treatment for Alcohol and Drug Addiction It can be hard to find the right place to get professional treatment. Here are some important things to consider:  There are different types of treatment to choose from.  Some programs are live-in (residential) while others are not (outpatient). Sometimes a combination is offered.  No single type of program is right for everyone.  Most treatment programs involve a combination of education, counseling, and a 12-step, spiritually-based approach.  There are non-spiritually based programs (not 12-step).  Some treatment programs are government sponsored. They are geared for patients without private insurance.  Treatment programs can vary in many respects such as:  Cost and types of insurance accepted.  Types of on-site medical services offered.  Length of stay, setting, and size.  Overall philosophy of treatment. A person may need specialized treatment or have needs not addressed by all programs. For example, adolescents need treatment appropriate for their age. Other people have secondary disorders that must be managed as well. Secondary conditions can include mental illness, such as depression or diabetes. Often, a period of detoxification from alcohol or drugs is needed. This requires medical supervision and not all programs offer this. THINGS TO CONSIDER WHEN SELECTING A TREATMENT PROGRAM   Is the program certified by the appropriate government agency? Even private programs must be certified and employ certified professionals.  Does the program accept your insurance? If not, can a payment plan be set up?  Is the facility clean, organized, and well run? Do they allow you to speak with graduates who can share their treatment experience with you? Can you tour the facility? Can you meet with staff?  Does the program meet the full range of individual needs?  Does the treatment program address sexual orientation and physical disabilities?  Do they provide age, gender, and culturally appropriate treatment services?  Is treatment available in languages other than English?  Is long-term aftercare support or guidance encouraged and provided?  Is assessment of an individual's treatment plan ongoing to ensure it meets changing needs?  Does the program use strategies to encourage reluctant patients to remain in treatment long enough to increase the likelihood of success?  Does the program offer counseling (individual or group) and other behavioral therapies?  Does the program offer medicine as part of the treatment regimen, if needed?  Is there ongoing monitoring of possible relapse? Is there a defined relapse prevention program? Are services or referrals offered to family members to ensure they understand addiction and the recovery process? This would help them support the recovering individual.  Are 12-step meetings held at the center or is transport available for patients to attend outside meetings? In countries outside of the [place], see local directories for contact information for services in your area. Document Released: 05/16/2005 Document Revised: 09/09/2011 Document Reviewed: 11/26/2007 ExitCare Patient Information 2015 [employer]. This information is not intended to replace advice given to you by your health care provider. Make sure you discuss any questions you have with your health care provider.  

## 2014-05-12 NOTE — BH Assessment (Signed)
Relayed results of assessment to Donell SievertSpencer Simon, PA. Per Karleen HampshireSpencer, [place] pt does not meet inpt criteria and can be discharged with OP resources.   Dr. Blinda LeatherwoodPollina in agreement. Informed RN and faxed resources along with no harm contract to [facility].    Clista BernhardtNancy Winnell Bento, [facility] Triage Specialist 05/12/2014 5:41 AM

## 2014-05-12 NOTE — BH Assessment (Signed)
Spoke with Dr. Blinda LeatherwoodPollina who reports pt presents to ED after earlier discharge 05/11/14 requesting etoh and cocain detox. Pt is medically cleared.   Requested cart be placed with pt for assessment.   Assessment to commence shortly.   Clista BernhardtNancy Mekiah Cambridge, [facility] Triage Specialist 05/12/2014 5:03 AM

## 2014-05-12 NOTE — ED Notes (Signed)
Pt given resources in the community to assist with detox.

## 2014-05-12 NOTE — ED Notes (Signed)
Pt belongings removed, pt in wine scrubs, security paged for wanding.
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
## 2014-05-13 ENCOUNTER — Emergency Department (HOSPITAL_COMMUNITY)
Admission: EM | Admit: 2014-05-13 | Discharge: 2014-05-13 | Disposition: A | Discharge status: Other Acute Inpatient Hospital | Payer: Medicare Other | Attending: Emergency Medicine | Admitting: Emergency Medicine

## 2014-05-13 ENCOUNTER — Encounter (HOSPITAL_COMMUNITY): Payer: Self-pay

## 2014-05-13 ENCOUNTER — Emergency Department (HOSPITAL_COMMUNITY): Payer: Medicare Other

## 2014-05-13 ENCOUNTER — Inpatient Hospital Stay (HOSPITAL_COMMUNITY)
Admission: AD | Admit: 2014-05-13 | Discharge: 2014-05-16 | DRG: 885 | Disposition: A | Discharge status: Home / Self Care | Payer: Medicare Other | Source: Intra-hospital | Attending: Psychiatry | Admitting: Psychiatry

## 2014-05-13 ENCOUNTER — Encounter (HOSPITAL_COMMUNITY): Payer: Self-pay | Admitting: *Deleted

## 2014-05-13 DIAGNOSIS — Z590 Homelessness: Secondary | ICD-10-CM

## 2014-05-13 DIAGNOSIS — G629 Polyneuropathy, unspecified: Secondary | ICD-10-CM | POA: Diagnosis present

## 2014-05-13 DIAGNOSIS — Z9119 Patient's noncompliance with other medical treatment and regimen: Secondary | ICD-10-CM | POA: Diagnosis present

## 2014-05-13 DIAGNOSIS — Z79899 Other long term (current) drug therapy: Secondary | ICD-10-CM | POA: Diagnosis not present

## 2014-05-13 DIAGNOSIS — F1420 Cocaine dependence, uncomplicated: Secondary | ICD-10-CM | POA: Insufficient documentation

## 2014-05-13 DIAGNOSIS — F205 Residual schizophrenia: Principal | ICD-10-CM | POA: Diagnosis present

## 2014-05-13 DIAGNOSIS — F1410 Cocaine abuse, uncomplicated: Secondary | ICD-10-CM | POA: Diagnosis present

## 2014-05-13 DIAGNOSIS — I10 Essential (primary) hypertension: Secondary | ICD-10-CM | POA: Diagnosis present

## 2014-05-13 DIAGNOSIS — F17210 Nicotine dependence, cigarettes, uncomplicated: Secondary | ICD-10-CM | POA: Diagnosis present

## 2014-05-13 DIAGNOSIS — S99921A Unspecified injury of right foot, initial encounter: Secondary | ICD-10-CM | POA: Insufficient documentation

## 2014-05-13 DIAGNOSIS — F209 Schizophrenia, unspecified: Secondary | ICD-10-CM | POA: Diagnosis not present

## 2014-05-13 DIAGNOSIS — F419 Anxiety disorder, unspecified: Secondary | ICD-10-CM | POA: Diagnosis present

## 2014-05-13 DIAGNOSIS — F200 Paranoid schizophrenia: Secondary | ICD-10-CM | POA: Diagnosis present

## 2014-05-13 DIAGNOSIS — Y92009 Unspecified place in unspecified non-institutional (private) residence as the place of occurrence of the external cause: Secondary | ICD-10-CM | POA: Insufficient documentation

## 2014-05-13 DIAGNOSIS — F29 Unspecified psychosis not due to a substance or known physiological condition: Secondary | ICD-10-CM | POA: Diagnosis present

## 2014-05-13 DIAGNOSIS — E119 Type 2 diabetes mellitus without complications: Secondary | ICD-10-CM | POA: Diagnosis present

## 2014-05-13 DIAGNOSIS — Y998 Other external cause status: Secondary | ICD-10-CM | POA: Insufficient documentation

## 2014-05-13 DIAGNOSIS — X58XXXA Exposure to other specified factors, initial encounter: Secondary | ICD-10-CM | POA: Insufficient documentation

## 2014-05-13 DIAGNOSIS — R45850 Homicidal ideations: Secondary | ICD-10-CM

## 2014-05-13 DIAGNOSIS — Z720 Tobacco use: Secondary | ICD-10-CM | POA: Insufficient documentation

## 2014-05-13 DIAGNOSIS — Z8669 Personal history of other diseases of the nervous system and sense organs: Secondary | ICD-10-CM | POA: Insufficient documentation

## 2014-05-13 DIAGNOSIS — Y9339 Activity, other involving climbing, rappelling and jumping off: Secondary | ICD-10-CM | POA: Insufficient documentation

## 2014-05-13 DIAGNOSIS — I509 Heart failure, unspecified: Secondary | ICD-10-CM | POA: Insufficient documentation

## 2014-05-13 DIAGNOSIS — F1919 Other psychoactive substance abuse with unspecified psychoactive substance-induced disorder: Secondary | ICD-10-CM

## 2014-05-13 DIAGNOSIS — Z792 Long term (current) use of antibiotics: Secondary | ICD-10-CM | POA: Insufficient documentation

## 2014-05-13 DIAGNOSIS — M79671 Pain in right foot: Secondary | ICD-10-CM

## 2014-05-13 LAB — CBC
HEMATOCRIT: 36.4 % — AB (ref 39.0–52.0)
Hemoglobin: 11.7 g/dL — ABNORMAL LOW (ref 13.0–17.0)
MCH: 29.8 pg (ref 26.0–34.0)
MCHC: 32.1 g/dL (ref 30.0–36.0)
MCV: 92.9 fL (ref 78.0–100.0)
Platelets: 190 10*3/uL (ref 150–400)
RBC: 3.92 MIL/uL — ABNORMAL LOW (ref 4.22–5.81)
RDW: 13 % (ref 11.5–15.5)
WBC: 7 10*3/uL (ref 4.0–10.5)

## 2014-05-13 LAB — ACETAMINOPHEN LEVEL: Acetaminophen (Tylenol), Serum: <15 ug/mL (ref 10–30)

## 2014-05-13 LAB — RAPID URINE DRUG SCREEN, HOSP PERFORMED
Amphetamines: NOT DETECTED
BARBITURATES: POSITIVE — AB
Benzodiazepines: NOT DETECTED
Cocaine: POSITIVE — AB
Opiates: NOT DETECTED
Tetrahydrocannabinol: NOT DETECTED

## 2014-05-13 LAB — COMPREHENSIVE METABOLIC PANEL
ALT: 8 U/L (ref 0–53)
AST: 21 U/L (ref 0–37)
Albumin: 4 g/dL (ref 3.5–5.2)
Alkaline Phosphatase: 87 U/L (ref 39–117)
Anion gap: 12 (ref 5–15)
BILIRUBIN TOTAL: 0.7 mg/dL (ref 0.3–1.2)
BUN: 17 mg/dL (ref 6–23)
CHLORIDE: 107 meq/L (ref 96–112)
CO2: 25 mEq/L (ref 19–32)
CREATININE: 0.96 mg/dL (ref 0.50–1.35)
Calcium: 9.9 mg/dL (ref 8.4–10.5)
GFR calc Af Amer: >90 mL/min (ref >90)
Glucose, Bld: 123 mg/dL — ABNORMAL HIGH (ref 70–99)
Potassium: 3.9 mEq/L (ref 3.7–5.3)
Sodium: 144 mEq/L (ref 137–147)
Total Protein: 7.5 g/dL (ref 6.0–8.3)

## 2014-05-13 LAB — ETHANOL

## 2014-05-13 LAB — SALICYLATE LEVEL: Salicylate Lvl: <2 mg/dL — ABNORMAL LOW (ref 2.8–20.0)

## 2014-05-13 MED ORDER — ARIPIPRAZOLE 5 MG PO TABS
5.0000 mg | ORAL_TABLET | Freq: Every day | ORAL | Status: DC
  Administered 2014-05-13: 5 mg via ORAL
  Filled 2014-05-13: qty 1

## 2014-05-13 MED ORDER — HYDROCHLOROTHIAZIDE 25 MG PO TABS
25.0000 mg | ORAL_TABLET | Freq: Every day | ORAL | Status: DC
  Administered 2014-05-13: 25 mg via ORAL
  Filled 2014-05-13: qty 1

## 2014-05-13 MED ORDER — ACETAMINOPHEN 325 MG PO TABS
650.0000 mg | ORAL_TABLET | ORAL | Status: DC | PRN
Start: 2014-05-13 — End: 2014-05-13
  Administered 2014-05-13 (×3): 650 mg via ORAL
  Filled 2014-05-13 (×3): qty 2

## 2014-05-13 MED ORDER — ARIPIPRAZOLE 5 MG PO TABS
5.0000 mg | ORAL_TABLET | Freq: Every day | ORAL | Status: DC
  Administered 2014-05-14 – 2014-05-15 (×2): 5 mg via ORAL
  Filled 2014-05-13 (×5): qty 1

## 2014-05-13 MED ORDER — FERROUS SULFATE 325 (65 FE) MG PO TABS
325.0000 mg | ORAL_TABLET | Freq: Every day | ORAL | Status: DC
  Administered 2014-05-13: 325 mg via ORAL
  Filled 2014-05-13 (×2): qty 1

## 2014-05-13 MED ORDER — BENZTROPINE MESYLATE 2 MG PO TABS
2.0000 mg | ORAL_TABLET | Freq: Two times a day (BID) | ORAL | Status: DC
  Administered 2014-05-13 – 2014-05-16 (×6): 2 mg via ORAL
  Filled 2014-05-13 (×5): qty 1
  Filled 2014-05-13: qty 2
  Filled 2014-05-13: qty 1
  Filled 2014-05-13: qty 2
  Filled 2014-05-13 (×2): qty 1

## 2014-05-13 MED ORDER — LORAZEPAM 1 MG PO TABS: 1.0000 mg | ORAL_TABLET | Freq: Three times a day (TID) | ORAL | Status: DC | PRN

## 2014-05-13 MED ORDER — HYDROCHLOROTHIAZIDE 25 MG PO TABS
25.0000 mg | ORAL_TABLET | Freq: Every day | ORAL | Status: DC
  Administered 2014-05-14 – 2014-05-16 (×3): 25 mg via ORAL
  Filled 2014-05-13 (×5): qty 1

## 2014-05-13 MED ORDER — ZOLPIDEM TARTRATE 5 MG PO TABS: 5.0000 mg | ORAL_TABLET | Freq: Every evening | ORAL | Status: DC | PRN

## 2014-05-13 MED ORDER — MAGNESIUM HYDROXIDE 400 MG/5ML PO SUSP
30.0000 mL | Freq: Every day | ORAL | Status: DC | PRN
  Administered 2014-05-14: 30 mL via ORAL
  Filled 2014-05-13: qty 30

## 2014-05-13 MED ORDER — ALUM & MAG HYDROXIDE-SIMETH 200-200-20 MG/5ML PO SUSP: 30.0000 mL | ORAL | Status: DC | PRN

## 2014-05-13 MED ORDER — FERROUS SULFATE 325 (65 FE) MG PO TABS
325.0000 mg | ORAL_TABLET | Freq: Every day | ORAL | Status: DC
  Administered 2014-05-14 – 2014-05-16 (×3): 325 mg via ORAL
  Filled 2014-05-13 (×4): qty 1

## 2014-05-13 MED ORDER — ALUM & MAG HYDROXIDE-SIMETH 200-200-20 MG/5ML PO SUSP
30.0000 mL | ORAL | Status: DC | PRN
  Administered 2014-05-14: 30 mL via ORAL
  Filled 2014-05-13: qty 30

## 2014-05-13 MED ORDER — ACETAMINOPHEN 325 MG PO TABS
650.0000 mg | ORAL_TABLET | Freq: Four times a day (QID) | ORAL | Status: DC | PRN
  Administered 2014-05-13 – 2014-05-16 (×10): 650 mg via ORAL
  Filled 2014-05-13 (×9): qty 2

## 2014-05-13 MED ORDER — ZOLPIDEM TARTRATE 5 MG PO TABS
5.0000 mg | ORAL_TABLET | Freq: Every evening | ORAL | Status: DC | PRN
  Administered 2014-05-13 – 2014-05-15 (×3): 5 mg via ORAL
  Filled 2014-05-13 (×3): qty 1

## 2014-05-13 MED ORDER — ACETAMINOPHEN 325 MG PO TABS
650.0000 mg | ORAL_TABLET | Freq: Once | ORAL | Status: AC
  Administered 2014-05-13: 650 mg via ORAL
  Filled 2014-05-13: qty 2

## 2014-05-13 MED ORDER — BENZTROPINE MESYLATE 1 MG PO TABS
2.0000 mg | ORAL_TABLET | Freq: Two times a day (BID) | ORAL | Status: DC
  Administered 2014-05-13 (×2): 2 mg via ORAL
  Filled 2014-05-13 (×2): qty 2
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
## 2014-05-13 NOTE — ED Notes (Signed)
Pt transported to [facility] by [facility] transportation service for continuation of specialized care. He left in no acute distress. 
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
## 2014-05-13 NOTE — [facility] Counselor (Signed)
[facility] T[facility] Sav[facility]Eric AC [facility], pt has been accepted to bed 506-2 by Bonnetta BarryShelly Eisbach NP and can be transferred after 7:30 tonight. [facility]. [employer] placed in pt's chart.   Evette Cristalaroline Paige Carl Bleecker, [place] Assessment Counselor

## 2014-05-13 NOTE — Progress Notes (Signed)
Psychoeducational Group Note  Date:  05/13/2014 Time:  2357  Group Topic/Focus:  Wrap-Up Group:   The focus of this group is to help patients review their daily goal of treatment and discuss progress on daily workbooks.  Participation Level: Did Not Attend  Participation Quality:  Not Applicable  Affect:  Not Applicable  Cognitive:  Not Applicable  Insight:  Not Applicable  Engagement in Group: Not Applicable  Additional Comments: The patient was admitted to the hallway after the group took place this evening.   Hazle CocaGOODMAN, Karsin Pesta S 05/13/2014, 11:58 PM
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
## 2014-05-14 DIAGNOSIS — F1490 Cocaine use, unspecified, uncomplicated: Secondary | ICD-10-CM

## 2014-05-14 LAB — GLUCOSE, CAPILLARY
GLUCOSE-CAPILLARY: 134 mg/dL — AB (ref 70–99)
GLUCOSE-CAPILLARY: 143 mg/dL — AB (ref 70–99)
GLUCOSE-CAPILLARY: 111 mg/dL — AB (ref 70–99)

## 2014-05-14 MED ORDER — NICOTINE 21 MG/24HR TD PT24
21.0000 mg | MEDICATED_PATCH | Freq: Every day | TRANSDERMAL | Status: DC
  Administered 2014-05-14 – 2014-05-16 (×3): 21 mg via TRANSDERMAL
  Filled 2014-05-14 (×6): qty 1

## 2014-05-14 NOTE — [facility] Group Notes (Signed)
[facility] Group Notes:  (Nursing/MHT/Case Management/Adjunct)  Date:  05/14/2014  Time:  6:25 PM  Type of Therapy:  Psychoeducational Skills-self inventory with rn  Participation Level:  Did Not Attend  Participation Quality:  na  Affect:  na  Cognitive:  na  Insight:  None  Engagement in Group:  na  Modes of Intervention:  na  Summary of Progress/Problems:  Malva LimesStrader, Margaree Sandhu 05/14/2014, 6:25 PM

## 2014-05-14 NOTE — Progress Notes (Signed)
Did not attend group 

## 2014-05-14 NOTE — [facility] Suicide Risk Assessment (Signed)
   Nursing information obtained from:   pt  Demographic factors:   male, homeless, AA Current Mental Status:   depressed with HI Loss Factors:   N/a Historical Factors:    poor therapeutic relationship, noncompliance with meds Risk Reduction Factors:    no hx of SI Total Time spent with patient: 45 minutes  CLINICAL FACTORS:   Depression:   Anhedonia Hopelessness Insomnia Unstable or Poor Therapeutic Relationship Previous Psychiatric Diagnoses and Treatments  Psychiatric Specialty Exam: Physical Exam  ROS  Blood pressure 158/89, pulse 82, temperature 98.4 F (36.9 C), temperature source Oral, resp. rate 16, height 5' 10.5" (1.791 m), weight 75.751 kg (167 lb).Body mass index is 23.62 kg/(m^2).  General Appearance: Disheveled  Eye SolicitorContact::  Fair  Speech:  Clear and Coherent and Normal Rate  Volume:  Normal  Mood:  Depressed  Affect:  Congruent  Thought Process:  Linear and Logical  Orientation:  Full (Time, Place, and Person)  Thought Content:  Negative  Suicidal Thoughts:  No  Homicidal Thoughts:  Yes.  without intent/plan  Memory:  Immediate;   Good Recent;   Good Remote;   Good  Judgement:  Fair  Insight:  Fair  Psychomotor Activity:  Normal  Concentration:  Good  Recall:  Good  Fund of Knowledge:Good  Language: Good  Akathisia:  No  Handed:  Right  AIMS (if indicated):     Assets:  Communication Skills Desire for Improvement Leisure Time Physical Health  Sleep:  Number of Hours: 4.75   Musculoskeletal: Strength & Muscle Tone: within normal limits Gait & Station: normal Patient leans: N/A  COGNITIVE FEATURES THAT CONTRIBUTE TO RISK:  Polarized thinking Thought constriction (tunnel vision)    SUICIDE RISK:   Minimal: No identifiable suicidal ideation.  Patients presenting with no risk factors but with morbid ruminations; may be classified as minimal risk based on the severity of the depressive symptoms  PLAN OF CARE:  I certify that inpatient  services furnished can reasonably be expected to improve the patient's condition.  Oletta DarterGARWAL, Keyante Durio 05/14/2014, 10:17 AM
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
## 2014-05-14 NOTE — [facility] Group Notes (Signed)
[facility] LCSW Group Therapy  05/14/2014 3:38 PM  Type of Therapy:  Group Therapy  Participation Level:  Did Not Attend  Participation Quality:  N/A  Affect:  N/A  Cognitive:  N/A  Insight:  N/A  Engagement in Therapy:  N/A  Modes of Intervention:  Discussion, Education, Exploration, Rapport Building and Support  Summary of Progress/Problems: Pt did not attend  Seabron SpatesVaughn, Graylee Arutyunyan Anne 05/14/2014, 3:38 PM

## 2014-05-14 NOTE — Progress Notes (Signed)
Patient ID: Sharolyn DouglasFredrick L Manas, male   DOB: [birth year], 53 y.o.   MRN: [id number] D. Pt presents with depressed mood, affect blunted. [name] thoughts continue to be disorganized, speech pressured and tangential. He states '' oh yeah i've lost about forty poiunds. I need that ensure.'' [name] rates his mood as depressed 7/10 on depression scale 10 being worst depression 1 being least. Pt states his goal for today his calm rest. [name] denies any SI/HI.Merlyn AlbertFred has been isolative to room throughout most of shift. He denies any other acute concerns. A. Medications given as ordered. Support and encouragement provided. R. Patient is safe. Will continue to monitor q 15 minutes for safety.
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
## 2014-05-14 NOTE — Progress Notes (Signed)
Patient ID: [name], male   DOB: 1960/08/26, 53 y.o.   MRN: [id number]  05/14/2014   Pt was very irritable and upset while attempting to complete PSA. Pt became angry after being asked "current atmosphere of home" and walked out of room saying "I am done, nice to meet you." CSW was unable to complete PSA at this time.   Calton DachWendy F. Dimitra Woodstock, MSW, LCSWA 05/14/2014 4:31 PM

## 2014-05-15 DIAGNOSIS — F259 Schizoaffective disorder, unspecified: Secondary | ICD-10-CM

## 2014-05-15 LAB — GLUCOSE, CAPILLARY
GLUCOSE-CAPILLARY: 129 mg/dL — AB (ref 70–99)
GLUCOSE-CAPILLARY: 135 mg/dL — AB (ref 70–99)
Glucose-Capillary: 208 mg/dL — ABNORMAL HIGH (ref 70–99)
Glucose-Capillary: 176 mg/dL — ABNORMAL HIGH (ref 70–99)

## 2014-05-15 MED ORDER — ARIPIPRAZOLE 5 MG PO TABS
5.0000 mg | ORAL_TABLET | Freq: Two times a day (BID) | ORAL | Status: DC
  Administered 2014-05-15 – 2014-05-16 (×2): 5 mg via ORAL
  Filled 2014-05-15 (×5): qty 1

## 2014-05-15 NOTE — [facility] Counselor (Signed)
Adult Comprehensive Assessment  Patient ID: [name], male   DOB: 29-Dec-1960, 53 y.o.   MRN: [id number]  Information Source: Information source: Patient  Current Stressors:  Educational / Learning stressors: Pt. denies Employment / Job issues: Pt denies Family Relationships: Pt denies Surveyor, quantityinancial / Lack of resources (include bankruptcy): Pt denies Housing / Lack of housing: Currently looking needing to find long term susbtance abuse treatment facility Physical health (include injuries & life threatening diseases): Pt denies Social relationships: pt denies Substance abuse: Crack cocaine Bereavement / Loss: Pt denies   Living/Environment/Situation:  Living Arrangements: Other relatives Living conditions (as described by patient or guardian): [place] area, dark at night and pt reports he is scared of dogs. How long has patient lived in current situation?: Many years about 25 years pt reports What is atmosphere in current home: Supportive, Dangerous  Family History:  Marital status: Single Does patient have children?: No  Childhood History:  By whom was/is the patient raised?: Other (Comment) ("I raised myself") Description of patient's relationship with caregiver when they were a child: Pt did not want to share Patient's description of current relationship with people who raised him/her: Pt did not want to share; however, reports they are living Does patient have siblings?: No Did patient suffer any verbal/emotional/physical/sexual abuse as a child?: No Did patient suffer from severe childhood neglect?: No Has patient ever been sexually abused/assaulted/raped as an adolescent or adult?: No Was the patient ever a victim of a crime or a disaster?: No Witnessed domestic violence?: No Has patient been effected by domestic violence as an adult?: No  Education:  Highest grade of school patient has completed: 12th Currently a student?: No Learning disability?:  No  Employment/Work Situation:   Employment situation: On disability Why is patient on disability: Mental Health  How long has patient been on disability: Since 1986 Patient's job has been impacted by current illness: No What is the longest time patient has a held a job?: 3 years Where was the patient employed at that time?: [employer]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 Has patient ever been in the [employer] and Companymilitary?: Yes (Describe in comment) Therapist, 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)) Has patient ever served in combat?: Yes Patient description of combat service[place]  Financial Resources:   Financial resources: [employer]ceives SSI, [employer]icaid, [employer] Health[employer] stamps, Medicare Does patient have a Lawyerrepresentative payee or guardian?: No  Alcohol/Substance Abuse:   What has been your use of drugs/alcohol within the last 12 months?: Crack cocaine, PCP, ATC, all kinds of alcohol If attempted suicide, did drugs/alcohol play a role in this?: No Alcohol/Substance Abuse Treatment Hx: Past Tx, Inpatient, Past Tx, Outpatient, Attends AA/NA, Past detox Has alcohol/substance abuse ever caused legal problems?: Yes  Social Support System:   Patient's Community Support System: Good Describe Community Support System: "My cousin" Type of faith/religion: Muslim  How does patient's faith help to cope with current illness?: "I just started reading the holy koran"   Leisure/Recreation:   Leisure and Hobbies: I like to play basketball, swim  Strengths/Needs:   What things does the patient do well?: "Give people good advice, knowledge, wisdom and understanding." In what areas does patient struggle / problems for patient: "I don't struggle anymore"  Discharge Plan:   Does patient have access to transportation?: No Plan for no access to transportation at discharge: Needs bus pass Will patient be returning to same living situation after discharge?: Yes Currently receiving community mental health services: Yes (From  Whom) [name]([name])  If no, would patient like referral for services when discharged?: No Does patient have financial barriers related to discharge medications?: No  Summary/Recommendations:   Pt is a 53 Yo African American single male who has been admitted due to substance abuse schizophrenia. Pt appeared very paranoid and anxious. Pt wanted to get through questions quickly and his main concern was his lack of housing. Pt discussed his crack cocaine use, panhandling, and religion while completing PSA and appeared coherent and oriented. Pt was minimal with his responses. [facility], he reports wanting long term substance abuse treatment.   Calton DachWendy F. Arby Dahir, MSW, LCSWA 05/15/2014 5:09 PM

## 2014-05-15 NOTE — Progress Notes (Signed)
Patient ID: [name], male   DOB: [birth year], 53 y.o.   MRN: [id number] [facility]  05/15/2014 2:18 PM [name]  MRN:  [id number] Subjective: Patient states "I'm not hearing voices or anything now. I really still feel like I'm all over the place sometimes but I'm a lot more mature than I used to be. I would love to have Thorazine 50mg  at night. They gave me that in prison for 4 years and it was fantastic as far as controlling my mood and making me calm down a lot. I didn't have any trouble from it at all and the voices were completely gone then".   Objective: Patient is seen and chart is reviewed. Pt denies SI, HI, and AVH. He reports that he would like to enroll in college if he can get his life organized and stay focused on treatment and staying clean from cocaine.    Diagnosis:   DSM5: Schizophrenia Disorders: Delusional Disorder (297.1) and Schizophrenia (295.7) Substance/Addictive Disorders: Cocaine use disorder Depressive Disorders: Disruptive Mood Dysregulation Disorder (296.99) Axis I: Schizoaffective Disorder Cocaine use disorder AXIS II: Cluster B Traits AXIS III:  Past Medical History  Diagnosis Date  . Diabetes mellitus without complication   . Hypertension   . Schizophrenia   . CHF (congestive heart failure)   . Neuropathy    AXIS IV: economic problems, housing problems, other psychosocial or environmental problems and problems related to social environment AXIS V: 41-50 serious symptoms   ADL's:  Intact  Sleep: Good  Appetite:  Good  Suicidal Ideation: No Plan:  denies Intent:  denies Means:  denies Homicidal Ideation: No denies AEB (as evidenced by):  Psychiatric Specialty Exam: Physical Exam  Psychiatric: His mood appears anxious. Cognition and memory are normal. He expresses impulsivity.    Review of Systems  Constitutional: Negative.   HENT: Negative.   Eyes: Negative.   Respiratory:  Negative.   Cardiovascular: Negative.   Gastrointestinal: Negative.   Genitourinary: Negative.   Musculoskeletal: Negative.   Skin: Negative.   Endo/Heme/Allergies: Negative.   Psychiatric/Behavioral: Positive for substance abuse. Negative for depression, suicidal ideas and hallucinations. The patient is nervous/anxious. The patient does not have insomnia.     Blood pressure 163/106, pulse 87, temperature 98.4 F (36.9 C), temperature source Oral, resp. rate 18, height 5' 10.5" (1.791 m), weight 75.751 kg (167 lb).Body mass index is 23.62 kg/(m^2).  General Appearance: Disheveled  Eye SolicitorContact::  Fair  Speech:  Pressured  Volume:  Increased  Mood:  Anxious  Affect:  Labile and Full Range  Thought Process:  Circumstantial  Orientation:  Full (Time, Place, and Person)  Thought Content:  Delusions (some persecutory delusions about community people)  Suicidal Thoughts:  No  Homicidal Thoughts:  No  Memory:  Immediate;   Fair Recent;   Fair Remote;   Fair  Judgement:  Poor  Insight:  Lacking  Psychomotor Activity:  Increased  Concentration:  Poor  Recall:  [employer]  Fund of Knowledge:Fair  Language: Fair  Akathisia:  No  Handed:  Right  AIMS (if indicated):     Assets:  Communication Skills Desire for Improvement Physical Health  Sleep:  Number of Hours: 2.75   Musculoskeletal: Strength & Muscle Tone: within normal limits Gait & Station: normal Patient leans: N/A  Current Medications: Current Facility-Administered Medications  Medication Dose Route Frequency Provider Last Rate Last Dose  . acetaminophen (TYLENOL) tablet 650 mg  650 mg Oral Q6H PRN Nanine MeansJamison Lord, NP  650 mg at 05/15/14 1622  . alum & mag hydroxide-simeth (MAALOX/MYLANTA) 200-200-20 MG/5ML suspension 30 mL  30 mL Oral Q4H PRN Nanine Means, NP   30 mL at 05/14/14 2138  . ARIPiprazole (ABILIFY) tablet 5 mg  5 mg Oral BID Beau Fanny, FNP   5 mg at 05/15/14 1723  . benztropine (COGENTIN) tablet 2 mg  2 mg Oral  BID Nanine Means, NP   2 mg at 05/15/14 1622  . ferrous sulfate tablet 325 mg  325 mg Oral Q breakfast Nanine Means, NP   325 mg at 05/15/14 4540  . hydrochlorothiazide (HYDRODIURIL) tablet 25 mg  25 mg Oral Daily Nanine Means, NP   25 mg at 05/15/14 9811  . magnesium hydroxide (MILK OF MAGNESIA) suspension 30 mL  30 mL Oral Daily PRN Nanine Means, NP   30 mL at 05/14/14 1622  . nicotine (NICODERM CQ - dosed in mg/24 hours) patch 21 mg  21 mg Transdermal Daily Beau Fanny, FNP   21 mg at 05/15/14 9147  . zolpidem (AMBIEN) tablet 5 mg  5 mg Oral QHS PRN Nanine Means, NP   5 mg at 05/14/14 2140    Lab Results:  Results for orders placed or performed during the hospital encounter of 05/13/14 (from the past 48 hour(s))  Glucose, capillary     Status: Abnormal   Collection Time: 05/14/14 11:40 AM  Result Value Ref Range   Glucose-Capillary 134 (H) 70 - 99 mg/dL  Glucose, capillary     Status: Abnormal   Collection Time: 05/14/14  4:52 PM  Result Value Ref Range   Glucose-Capillary 143 (H) 70 - 99 mg/dL  Glucose, capillary     Status: Abnormal   Collection Time: 05/14/14  9:57 PM  Result Value Ref Range   Glucose-Capillary 111 (H) 70 - 99 mg/dL  Glucose, capillary     Status: Abnormal   Collection Time: 05/15/14  6:34 AM  Result Value Ref Range   Glucose-Capillary 129 (H) 70 - 99 mg/dL  Glucose, capillary     Status: Abnormal   Collection Time: 05/15/14 11:40 AM  Result Value Ref Range   Glucose-Capillary 208 (H) 70 - 99 mg/dL   Comment 1 Notify RN   Glucose, capillary     Status: Abnormal   Collection Time: 05/15/14  4:57 PM  Result Value Ref Range   Glucose-Capillary 135 (H) 70 - 99 mg/dL   Comment 1 Notify RN     Physical Findings: AIMS: Facial and Oral Movements Muscles of Facial Expression: None, normal Lips and Perioral Area: None, normal Jaw: None, normal Tongue: None, normal,Extremity Movements Upper (arms, wrists, hands, fingers): None, normal Lower (legs, knees,  ankles, toes): None, normal, Trunk Movements Neck, shoulders, hips: None, normal, Overall Severity Severity of abnormal movements (highest score from questions above): None, normal Incapacitation due to abnormal movements: None, normal Patient's awareness of abnormal movements (rate only patient's report): No Awareness, Dental Status Current problems with teeth and/or dentures?: Yes Does patient usually wear dentures?: No  CIWA:  CIWA-Ar Total: 0 COWS:  COWS Total Score: 0  Treatment Plan Summary: Daily contact with patient to assess and evaluate symptoms and progress in treatment Medication management  Plan: Review of chart, vital signs, medications, and notes.  1-Individual and group therapy  2-Medication management for depression and anxiety: Medications reviewed with the patient and he stated no untoward effects.  -Increase Abilify from 5mg  daily to 5mg  bid -Continue cogentin at current dose  3-Coping  skills for depression, anxiety  4-Continue crisis stabilization and management  5-Address health issues--monitoring vital signs, stable  6-Treatment plan in progress to prevent relapse of depression and anxiety  Medical Decision Making Problem Points:  Established problem, improving (1), Review of last therapy session (1) and Review of psycho-social stressors (1) Data Points:  Order Aims Assessment (2) Review or order clinical lab tests (1) Review of medication regiment & side effects (2)  I certify that inpatient services furnished can reasonably be expected to improve the patient's condition.   Beau FannyWithrow, John C, FNP-BC 05/15/2014, 2:18 PM

## 2014-05-15 NOTE — Progress Notes (Signed)
Patient ID: Taylor DouglasFredrick L [name], male   DOB: 1960-08-21, 53 y.o.   MRN: [id number] D-Patient alert and oriented.  Patient is anxious with rapid and tangential speech. Denies SI, HI, and AVH. Complaints of feet and face pain. Patient rated pain a 8/10. Pain medication was given per MD order (See MAR). At reassessment patient was resting in bed with his eyes closed. Patient stated that he had a "good" day today and then stated "every day is a good day". A- Support and encouragement provided.  Patient given medication to decrease pain. Routine safety checks conducted every 15 minutes.  Patient informed to notify staff with problems or concerns. R- Patient contracts for safety at this time. Patient compliant with medication and treatment and has been calm and cooperative.

## 2014-05-15 NOTE — Progress Notes (Signed)
D: Pt presented to nurse practionar and said, quite honesly..." I need my monring medicine. WHen this nurrse eggops uooppppppsoops its alll oggod and im no  yu sk[alnking of it.

## 2014-05-15 NOTE — Progress Notes (Signed)
Patient ID: [name], male   DOB: May 07, 1961, 53 y.o.   MRN: [id number] D. Patient presents with irritable mood, affect congruent. [name] was very irritable this morning, refusing to Designer, multimediaanswer writer upon approach. He states '' I don't feel like talking right now. '' He appeared internally preoccupied. Later during group he stated '' i'm an addict I don't have anything else to say. '' Refusing to engage in group. Fredricks thoughts remain disorganized. A. Medications given as ordered. Support and encouragement provided. Discussed pt progress with Renata Capriceonrad NP. R. Patient is safe. Will continue to monitor q 15 minutes for safety,

## 2014-05-15 NOTE — [facility] Group Notes (Signed)
[facility] LCSW Group Therapy  05/15/2014 11:00 AM   Type of Therapy:  Group Therapy  Participation Level:  Did Not Attend  Gracey Tolle Horton, LCSW 05/15/2014 11:41 AM    

## 2014-05-15 NOTE — Progress Notes (Signed)
Psychoeducational Group Note  Date: 05/15/2014 Time: 1015  Group Topic/Focus:  Making Healthy Choices:   The focus of this group is to help patients identify negative/unhealthy choices they were using prior to admission and identify positive/healthier coping strategies to replace them upon discharge.  Participation Level:  Active  Participation Quality:  Appropriate  Affect:  Anxious  Cognitive:  Appropriate  Insight:  Engaged  Engagement in Group:    Additional Comments:    05/15/2014,5:44 PM Sophi Calligan, Joie BimlerPatricia Lynn

## 2014-05-16 DIAGNOSIS — F1410 Cocaine abuse, uncomplicated: Secondary | ICD-10-CM

## 2014-05-16 DIAGNOSIS — F209 Schizophrenia, unspecified: Secondary | ICD-10-CM

## 2014-05-16 DIAGNOSIS — F1420 Cocaine dependence, uncomplicated: Secondary | ICD-10-CM | POA: Insufficient documentation

## 2014-05-16 LAB — GLUCOSE, CAPILLARY
Glucose-Capillary: 139 mg/dL — ABNORMAL HIGH (ref 70–99)
Glucose-Capillary: 113 mg/dL — ABNORMAL HIGH (ref 70–99)

## 2014-05-16 MED ORDER — ENSURE COMPLETE PO LIQD
237.0000 mL | Freq: Two times a day (BID) | ORAL | Status: DC
  Administered 2014-05-16: 237 mL via ORAL

## 2014-05-16 MED ORDER — ZOLPIDEM TARTRATE 5 MG PO TABS: 5.0000 mg | ORAL_TABLET | Freq: Every evening | ORAL | Status: DC | PRN

## 2014-05-16 MED ORDER — ARIPIPRAZOLE 5 MG PO TABS: 5.0000 mg | ORAL_TABLET | Freq: Two times a day (BID) | ORAL | Status: DC

## 2014-05-16 MED ORDER — BENZTROPINE MESYLATE 2 MG PO TABS: 2.0000 mg | ORAL_TABLET | Freq: Two times a day (BID) | ORAL | Status: DC

## 2014-05-16 MED ORDER — HYDROCHLOROTHIAZIDE 25 MG PO TABS: 25.0000 mg | ORAL_TABLET | Freq: Every day | ORAL | Status: DC

## 2014-05-16 NOTE — Progress Notes (Signed)
D. 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 Pt spoke about feeling anxious and did look anxious this evening in the milieu. Pt spoke about how he was tired and was wanting to get a good night's rest and retired to his room before 10pm, but did receive medications without incident. A. Support and encouragement provided. R. Safety maintained, will continue to monitor.

## 2014-05-16 NOTE — [facility] Suicide Risk Assessment (Signed)
[facility] INPATIENT:  Family/Significant Other Suicide Prevention Education  Suicide Prevention Education:  Contact Attempts: [name] (pt's cousin) [phone] has been identified by the patient as the family member/significant other with whom the patient will be residing, and identified as the person(s) who will aid the patient in the event of a mental health crisis.  With written consent from the patient, two attempts were made to provide suicide prevention education, prior to and/or following the patient's discharge.  We were unsuccessful in providing suicide prevention education.  A suicide education pamphlet was given to the patient to share with family/significant other.  Pt not longer endorsing HI and denied SI upon admission and during self report. Pt reports no access to guns/firearms. SPE completed with pt/SPI pamphlet provided to him and he was encouraged to share information with support network, ask questions, and talk about any concerns relating to SPE.   Date and time of first attempt: 10:00AM 05/16/14 Date and time of second attempt: 11:44AM 05/16/14 (generic voicemail left requesting call back at her earliest convenience)  [name], TenkillerHeather  LCSWA   05/16/2014, 11:43 AM

## 2014-05-16 NOTE — Progress Notes (Signed)
Pt up at this time and has been so for about the past 30 minutes. Pt has been up and down throughout the evening, making multiple trips to the window to check on time or to ask for some tylenol for his feet. Pt reports that he fell asleep 5 times this evening and he is feeling ok.

## 2014-05-16 NOTE — [facility] Group Notes (Signed)
[facility]   05/16/2014 11:39 AM  Participation Quality:  Appropriate   Mood/Affect:  Appropriate  Depression Rating:  0  Anxiety Rating:  0  Thoughts of Suicide:  No Will you contract for safety?   NA  Current AVH:  No  Plan for Discharge/Comments:  Pt reports that he is ready for d/c and is planning to return home for the time being and would like to seek inpatient treatment by himself. CSW offered to begin process for him but pt quick to decline. Pt pleasant during group, stating that [facility]   Transportation Means: bus pass provided to pt   Supports: "my cousin AnimatorCandace"   Smart, OncologistHeather LCSWA

## 2014-05-16 NOTE — Discharge Summary (Signed)
Physician Discharge Summary Note  Patient:  [name] is an 53 y.o., male MRN:  [id number] DOB:  07/30/1960 Patient phone:  [phone] (home)  Patient address:   [address] [place] [address],  Total Time spent with patient: 45 minutes  Date of Admission:  05/13/2014 Date of Discharge: 05/16/2014  Reason for Admission:  psychosis  Discharge Diagnoses:   Primary Psychiatric Diagnosis: Schizophrenia ,multiple episodes ,currently in acute episode (resolved)  Active Problems:   Schizophrenia, chronic condition with acute exacerbation   Cocaine use disorder, severe, dependence   Psychiatric Specialty Exam: Physical Exam  Vitals reviewed. Psychiatric: He has a normal mood and affect. His behavior is normal. Judgment and thought content normal. Cognition and memory are normal.    Review of Systems  Constitutional: Negative.   HENT: Negative.   Respiratory: Negative.   Cardiovascular: Negative.   Gastrointestinal: Negative.   Genitourinary: Negative.   Musculoskeletal: Negative.   Skin: Negative.   Neurological: Negative.   Endo/Heme/Allergies: Negative.   Psychiatric/Behavioral: Positive for depression (hx of, chronic). Negative for suicidal ideas, hallucinations, memory loss and substance abuse. The patient is nervous/anxious and has insomnia.     Blood pressure 167/82, pulse 75, temperature 97.8 F (36.6 C), temperature source Oral, resp. rate 20, height 5' 10.5" (1.791 m), weight 75.751 kg (167 lb).Body mass index is 23.62 kg/(m^2).   Past Psychiatric History:Yes  Diagnosis:Schizophrenia   Hospitalizations: Multiple at [facility]   Outpatient Care:Did not follow up as directed at [facility]   Substance Abuse Care:Patient reports once in the past at a facility that is now closed.   Self-Mutilation:Denies   Suicidal Attempts:No   Violent Behaviors:No    Musculoskeletal: Strength & Muscle Tone: within normal limits Gait & Station: normal Patient  leans: N/A  DSM5: Primary Psychiatric Diagnosis: Schizophrenia ,multiple episodes ,currently in acute episode (resolved) Secondary Psychiatric Diagnosis: Cocaine use disorder,severe Non Psychiatric Diagnosis: CHF DM  Neuropathy  Past Medical History  Diagnosis Date  . Diabetes mellitus without complication   . Hypertension   . Schizophrenia   . CHF (congestive heart failure)   . Neuropathy    Level of Care:  OP  Hospital Course:  [name] is a 53 year old African American male who presents to the emergency department with acute psychosis. Patient is highly paranoid and believes that "somone has put a spell on him in [place]" Patient states that he knows people are after him and are trying to kill him. Feels that he is unsafe in his current home and is worried that his friends are out to get him "I have jealous friends" Patient denies that he is feeling suicidal or homicidal. Does not endorse auditory or visual hallucinations. Patient has paranoid delusions which are most likely exacerbated by his cocaine use.  UDS was positive for cocaine and barbiturates.  Pt denied SI, HI, and AVH, contracts for safety. Pt reports that he is very anxious and depressed about his situation with drug abuse and that he wants help detoxing from these substances. He states that his biggest concern is crack.   Patient was admitted to re-stabilize moods, crisis management.  Group therapy was offered to learn better coping life skills.  Medication management for depression and anxiety, Abilify 5mg  daily and Ambien 5 mg insomnia PRN.  Tolerated well and there was no report adverse side effects.  There was improvement noted in patient's mood.  At time of discharge, rated both depression and anxiety levels to be manageable and minimal.  Denies physiological concerns/SI/HI/AVH at time of discharge.  He understood importance of adhering to medication and outpatient treatment.     Consults:   psychiatry  Significant Diagnostic Studies:  labs: per ED  Discharge Vitals:   Blood pressure 167/82, pulse 75, temperature 97.8 F (36.6 C), temperature source Oral, resp. rate 20, height 5' 10.5" (1.791 m), weight 75.751 kg (167 lb). Body mass index is 23.62 kg/(m^2). Lab Results:   Results for orders placed or performed during the hospital encounter of 05/13/14 (from the past 72 hour(s))  Glucose, capillary     Status: Abnormal   Collection Time: 05/14/14 11:40 AM  Result Value Ref Range   Glucose-Capillary 134 (H) 70 - 99 mg/dL  Glucose, capillary     Status: Abnormal   Collection Time: 05/14/14  4:52 PM  Result Value Ref Range   Glucose-Capillary 143 (H) 70 - 99 mg/dL  Glucose, capillary     Status: Abnormal   Collection Time: 05/14/14  9:57 PM  Result Value Ref Range   Glucose-Capillary 111 (H) 70 - 99 mg/dL  Glucose, capillary     Status: Abnormal   Collection Time: 05/15/14  6:34 AM  Result Value Ref Range   Glucose-Capillary 129 (H) 70 - 99 mg/dL  Glucose, capillary     Status: Abnormal   Collection Time: 05/15/14 11:40 AM  Result Value Ref Range   Glucose-Capillary 208 (H) 70 - 99 mg/dL   Comment 1 Notify RN   Glucose, capillary     Status: Abnormal   Collection Time: 05/15/14  4:57 PM  Result Value Ref Range   Glucose-Capillary 135 (H) 70 - 99 mg/dL   Comment 1 Notify RN   Glucose, capillary     Status: Abnormal   Collection Time: 05/15/14  9:19 PM  Result Value Ref Range   Glucose-Capillary 176 (H) 70 - 99 mg/dL  Glucose, capillary     Status: Abnormal   Collection Time: 05/16/14  6:04 AM  Result Value Ref Range   Glucose-Capillary 139 (H) 70 - 99 mg/dL  Glucose, capillary     Status: Abnormal   Collection Time: 05/16/14 11:53 AM  Result Value Ref Range   Glucose-Capillary 113 (H) 70 - 99 mg/dL    Physical Findings: AIMS: Facial and Oral Movements Muscles of Facial Expression: None, normal Lips and Perioral Area: None, normal Jaw: None,  normal Tongue: None, normal,Extremity Movements Upper (arms, wrists, hands, fingers): None, normal Lower (legs, knees, ankles, toes): None, normal, Trunk Movements Neck, shoulders, hips: None, normal, Overall Severity Severity of abnormal movements (highest score from questions above): None, normal Incapacitation due to abnormal movements: None, normal Patient's awareness of abnormal movements (rate only patient's report): No Awareness, Dental Status Current problems with teeth and/or dentures?: Yes Does patient usually wear dentures?: No  CIWA:  CIWA-Ar Total: 1 COWS:  COWS Total Score: 1  Psychiatric Specialty Exam: See Psychiatric Specialty Exam and Suicide Risk Assessment completed by Attending Physician prior to discharge.  Discharge destination:  Home  Is patient on multiple antipsychotic therapies at discharge:  No   Has Patient had three or more failed trials of antipsychotic monotherapy by history:  No  Recommended Plan for Multiple Antipsychotic Therapies: NA     Medication List    STOP taking these medications        cephALEXin 500 MG capsule  Commonly known as:  KEFLEX     fluPHENAZine 5 MG tablet  Commonly known as:  PROLIXIN     fluPHENAZine  decanoate 25 MG/ML injection  Commonly known as:  PROLIXIN      TAKE these medications      Indication   ARIPiprazole 5 MG tablet  Commonly known as:  ABILIFY  Take 1 tablet (5 mg total) by mouth 2 (two) times daily.   Indication:  mood stabilization     benztropine 2 MG tablet  Commonly known as:  COGENTIN  Take 1 tablet (2 mg total) by mouth 2 (two) times daily.      hydrochlorothiazide 25 MG tablet  Commonly known as:  HYDRODIURIL  Take 1 tablet (25 mg total) by mouth daily.      zolpidem 5 MG tablet  Commonly known as:  AMBIEN  Take 1 tablet (5 mg total) by mouth at bedtime as needed for sleep.   Indication:  Trouble Sleeping       Follow-up Information    Follow up with [name].   Why:  Walk in  between 8am-9am Monday through Friday for hospital follow-up/medication management/assessment for therapy services. Please go within 3 days of discharge from the hospital for follow-up visit.    Contact information:   [address] [place], [place] [address] Phone: [phone] Fax: [phone]      Follow-up recommendations:  Activity:  as tolerated, diet as tol  Comments:  1.  Take all your medications as prescribed.              2.  Report any adverse side effects to outpatient provider.                       3.  Patient instructed to not use alcohol or illegal drugs while on prescription medicines.            4.  In the event of worsening symptoms, instructed patient to call 911, the crisis hotline or go to nearest emergency room for evaluation of symptoms.  Total Discharge Time:  Greater than 30 minutes.  SignedAdonis Brook: Lucianne Smestad MAY, AGNP-BC 05/16/2014, 1:45 PM

## 2014-05-16 NOTE — Progress Notes (Signed)
NUTRITION ASSESSMENT  Pt identified as at risk on the Malnutrition Screen Tool  INTERVENTION: 1. Educated patient on the importance of nutrition and encouraged intake of food and beverages. 2. Discussed weight goals. 3. Supplements: Ensure Complete po BID, each supplement provides 350 kcal and 13 grams of protein  NUTRITION DIAGNOSIS: Unintentional weight loss related to sub-optimal intake as evidenced by pt report.   Goal: Pt to meet >/= 90% of their estimated nutrition needs.  Monitor:  PO intake  Assessment:  53 year old African American male who presents to the emergency department with acute psychosis.Patient has paranoid delusions which are most likely exacerbated by his cocaine use.  Pt reports UBW of 180 lb, pt has had 13 lb weight loss. Pt feels like he is in great shape. Pt admits to using crack and this causes him to be very physically active. Pt states that he doesn't eat as well when using.   During visit, staff reports pt requesting Ensure supplements. RD to provide supplement BID.   Discussed with pt the importance of eating 3 meals a day with snacks, emphasizing protein consumption. Discussed the importance of good nutrition for mental health and aiding in recovery.   When asked about history of diabetes, pt denies having diabetes anymore. He states that he had diabetes for 10 years but says "it went away" in the last year. Pt states his blood sugar levels have not been above 100 in some time. RD explained to pt that diabetes has no cure but blood sugar can be controlled. Pt has no questions regarding diabetes diet at this time.  Height: Ht Readings from Last 1 Encounters:  05/13/14 5' 10.5" (1.791 m)    Weight: Wt Readings from Last 1 Encounters:  05/13/14 167 lb (75.751 kg)    Weight Hx: Wt Readings from Last 10 Encounters:  05/13/14 167 lb (75.751 kg)  05/11/14 154 lb (69.854 kg)  05/03/14 154 lb (69.854 kg)  05/02/14 151 lb 14.4 oz (68.901 kg)   04/13/14 170 lb (77.111 kg)  04/01/14 167 lb (75.751 kg)  03/22/14 160 lb (72.576 kg)  01/01/14 165 lb (74.844 kg)  12/12/13 165 lb (74.844 kg)  12/11/13 168 lb 3.4 oz (76.3 kg)    BMI:  Body mass index is 23.62 kg/(m^2). Pt meets criteria for normal range based on current BMI.  Estimated Nutritional Needs: Kcal: 25-30 kcal/kg Protein: > 1 gram protein/kg Fluid: 1 ml/kcal  Diet Order:   Pt is also offered choice of unit snacks mid-morning and mid-afternoon.  Pt is eating as desired.   Lab results and medications reviewed.  Glucose 123  Tilda FrancoLindsey Kerolos Nehme, MS, [place]RD, [place]N Pager: [phone] After Hours Pager: [phone]

## 2014-05-16 NOTE — Progress Notes (Signed)
[facility] :  Will you be returning to the same living situation after discharge: Yes,  home At discharge, do you have transportation home?:Yes,  bus pass provided to pt by CSW. Do you have the ability to pay for your medications:Yes,  Medicare  Release of information consent forms completed and submitted to medical records by CSW.  Patient to Follow up at: Follow-up Information    Follow up with [name].   Why:  Walk in between 8am-9am Monday through Friday for hospital follow-up/medication management/assessment for therapy services. Please go within 3 days of discharge from the hospital for follow-up visit.    Contact information:   [address] [place] [place], [place] [address] Phone: [phone] Fax: [phone]      Patient denies SI/HI:   Yes,  during group/self report.     Safety Planning and Suicide Prevention discussed:  Yes,  Contact attempts made with pt's cousin. SPE completed with pt. SPI pamphlet provided to pt and he was encouraged to share information with support network, ask questions, and talk about any concerns relating to SPE. Pt also no longer endorsing HI.   Smart, Octavian Godek LCSWA  05/16/2014, 11:52 AM

## 2014-05-16 NOTE — Progress Notes (Signed)
Patient ID: [name], male   DOB: 10-11-1960, 53 y.o.   MRN: [id number] PER STATE REGULATIONS 482.30  THIS CHART WAS REVIEWED FOR MEDICAL NECESSITY WITH RESPECT TO THE PATIENT'S ADMISSION/ DURATION OF STAY.  NEXT REVIEW DATE: 05/17/2014  Willa RoughJENNIFER JONES Barney Gertsch, RN, BSN CASE MANAGER

## 2014-05-16 NOTE — Tx Team (Signed)
Interdisciplinary Treatment Plan Update (Adult)   Date: 05/16/2014   Time Reviewed: 9:00AM Progress in Treatment:  Attending groups: Yes  Participating in groups:  Yes  Taking medication as prescribed: Yes  Tolerating medication: Yes  Family/Significant othe contact made: Attempts made to reach pt's cousin. SPE completed with pt.   Patient understands diagnosis: Yes, AEB seeking treatment for passive HI, paranoid ideations/symptoms of psychosis, mood stabilization, and for med management.  Discussing patient identified problems/goals with staff: Yes  Medical problems stabilized or resolved: Yes  Denies suicidal/homicidal ideation: Yes during self report/group. Pt no longer endorsing HI.  Patient has not harmed self or Others: Yes  New problem(s) identified: n/a  Discharge Plan or Barriers: Pt to return home (bus pass provided to him by CSW). [facility]. CSW offered to begin referral process for [facility] and [facility] but pt refused, stating he needs to do this himself at a later time.  Additional comments:  [name] is a 53 year old African American male who presents to the emergency department with acute psychosis. Patient is highly paranoid and believes that "[place]" Patient states that he knows people are after him and are trying to kill him. Feels that he is unsafe in his current home and is worried that his friends are out to get him "I have jealous friends" Patient denies that he is feeling suicidal or homicidal. Does not endorse auditory or visual hallucinations. Patient has paranoid delusions which are most likely exacerbated by his cocaine use. UDS was positive for cocaine and barbiturates. Reason for Continuation of Hospitalization: none Estimated length of stay: d/c today  For review of initial/current patient goals, please see plan of care.  Attendees:  Patient:    Family:    Physician: Dr.  Elna BreslowEappen MD 05/16/2014 .  Nursing: Chales AbrahamsBrittany, Christa, Beverly RN 05/16/2014   Clinical Social Worker Jheri Mitter Smart, LCSWA  05/16/2014   Other: Daryel Geraldodney North, Santa Generanne Cunningham LCSW 05/16/2014   Other: Darden DatesJennifer C. Nurse CM 05/16/2014   Other: Tomasita Morrowelora Sutton, Community Care Coordinator  05/16/2014   Other:  05/16/2014   Scribe for Treatment Team:  Herbert SetaHeather Smart LCSWA 05/16/2014 9:00AM

## 2014-05-16 NOTE — Progress Notes (Signed)
Discharge Note:  Patient discharged with bus ticket.  Denied SI and HI.  Denied A/V hallucinations.  Denied pain.  Suicide prevention information given and discussed with patient who stated he understood and had no questions.  Patient stated he received all his belonging, clothing, toiletries, misc items, lighter, strings, keys, prescriptions, medications, etc.  Patient stated he appreciated all [facility].  Patient refused to fill out his survey given him.  Patient has bus ticket.

## 2014-05-16 NOTE — Progress Notes (Addendum)
D:  Patient's self inventory sheet, patient sleeps good, sleep medication is helpful.  Fair appetite, normal energy level, good concentration.  Rated depression and hopeless #5, anxiety #7.  Withdrawals continue, diarrhea.  Denied SI.  SI sometimes, contracts for safety.    Physical pain are feet, worst pain #7.  Pain medication is helpful.  Goal today is his temper.  Plans to focus and maintain.  Do discharge plan. A:  Medications administered per MD orders.  Emotional support and encouragement given patient. R:  Denied SI and HI, contracts while talking to nurse.  Denied A/V hallucinations.  Safety maintained with 15 minute checks.

## 2014-05-16 NOTE — [facility] Suicide Risk Assessment (Signed)
   Demographic Factors:  Male and Divorced or widowed  Total Time spent with patient: 45 minutes  Psychiatric Specialty Exam: Physical Exam  ROS  Blood pressure 168/91, pulse 83, temperature 97.8 F (36.6 C), temperature source Oral, resp. rate 20, height 5' 10.5" (1.791 m), weight 75.751 kg (167 lb).Body mass index is 23.62 kg/(m^2).  General Appearance: Casual  Eye Contact::  Fair  Speech:  Clear and Coherent  Volume:  Normal  Mood:  Euthymic  Affect:  Congruent  Thought Process:  Coherent  Orientation:  Full (Time, Place, and Person)  Thought Content:  WDL  Suicidal Thoughts:  No  Homicidal Thoughts:  No  Memory:  Immediate;   Fair Recent;   Fair Remote;   Fair  Judgement:  Fair  Insight:  Fair  Psychomotor Activity:  Normal  Concentration:  Fair  Recall:  FiservFair  Fund of Knowledge:Fair  Language: Fair  Akathisia:  No  Handed:  Right  AIMS (if indicated):     Assets:  Communication Skills Desire for Improvement  Sleep:  Number of Hours: 2.25    Musculoskeletal: Strength & Muscle Tone: within normal limits Gait & Station: normal Patient leans: N/A   Mental Status Per Nursing Assessment::   On Admission:     Current Mental Status by Physician: patient denies SI/HI/AH/VH,denies any mood symptoms.Pt was admitted for psychosis,but he reports that he had abused cociane and was not taking his medications ,but now that he is back on it ,he feels better and is in a better mood.  Loss Factors: NA  Historical Factors: Impulsivity  Risk Reduction Factors:   Positive social support  Continued Clinical Symptoms:  Alcohol/Substance Abuse/Dependencies Previous Psychiatric Diagnoses and Treatments Medical Diagnoses and Treatments/Surgeries  Cognitive Features That Contribute To Risk:  Polarized thinking    Suicide Risk:  Minimal: No identifiable suicidal ideation.    Discharge Diagnoses: Primary Psychiatric Diagnosis: Schizophrenia ,multiple episodes  ,currently in acute episode (resolved)   Secondary Psychiatric Diagnosis: Cocaine use disorder,severe   Non Psychiatric Diagnosis: CHF DM  Neuropathy  Past Medical History  Diagnosis Date  . Diabetes mellitus without complication   . Hypertension   . Schizophrenia   . CHF (congestive heart failure)   . Neuropathy     Plan Of Care/Follow-up recommendations:  Diet:  Diabetic diet Activity:  regular  Is patient on multiple antipsychotic therapies at discharge:  No   Has Patient had three or more failed trials of antipsychotic monotherapy by history:  No  Recommended Plan for Multiple Antipsychotic Therapies: NA    Antion Andres MD 05/16/2014, 10:07 AM

## 2014-05-18 ENCOUNTER — Encounter (HOSPITAL_COMMUNITY): Payer: Self-pay | Admitting: Emergency Medicine

## 2014-05-18 ENCOUNTER — Emergency Department (HOSPITAL_COMMUNITY)
Admission: EM | Admit: 2014-05-18 | Discharge: 2014-05-18 | Disposition: A | Discharge status: Home / Self Care | Payer: Medicare Other | Attending: Emergency Medicine | Admitting: Emergency Medicine

## 2014-05-18 DIAGNOSIS — F23 Brief psychotic disorder: Secondary | ICD-10-CM | POA: Insufficient documentation

## 2014-05-18 DIAGNOSIS — E119 Type 2 diabetes mellitus without complications: Secondary | ICD-10-CM | POA: Insufficient documentation

## 2014-05-18 DIAGNOSIS — F1410 Cocaine abuse, uncomplicated: Secondary | ICD-10-CM | POA: Insufficient documentation

## 2014-05-18 DIAGNOSIS — F1420 Cocaine dependence, uncomplicated: Secondary | ICD-10-CM

## 2014-05-18 DIAGNOSIS — F419 Anxiety disorder, unspecified: Secondary | ICD-10-CM | POA: Insufficient documentation

## 2014-05-18 DIAGNOSIS — I10 Essential (primary) hypertension: Secondary | ICD-10-CM | POA: Insufficient documentation

## 2014-05-18 DIAGNOSIS — Z79899 Other long term (current) drug therapy: Secondary | ICD-10-CM | POA: Insufficient documentation

## 2014-05-18 DIAGNOSIS — Z720 Tobacco use: Secondary | ICD-10-CM | POA: Insufficient documentation

## 2014-05-18 DIAGNOSIS — Z8669 Personal history of other diseases of the nervous system and sense organs: Secondary | ICD-10-CM | POA: Insufficient documentation

## 2014-05-18 DIAGNOSIS — I509 Heart failure, unspecified: Secondary | ICD-10-CM | POA: Insufficient documentation

## 2014-05-18 LAB — COMPREHENSIVE METABOLIC PANEL
ALT: 8 U/L (ref 0–53)
AST: 26 U/L (ref 0–37)
Albumin: 4.2 g/dL (ref 3.5–5.2)
Alkaline Phosphatase: 83 U/L (ref 39–117)
Anion gap: 13 (ref 5–15)
BUN: 17 mg/dL (ref 6–23)
CALCIUM: 9.9 mg/dL (ref 8.4–10.5)
CO2: 26 mEq/L (ref 19–32)
CREATININE: 0.82 mg/dL (ref 0.50–1.35)
Chloride: 100 mEq/L (ref 96–112)
GFR calc Af Amer: >90 mL/min (ref >90)
GLUCOSE: 152 mg/dL — AB (ref 70–99)
Potassium: 3.7 mEq/L (ref 3.7–5.3)
Sodium: 139 mEq/L (ref 137–147)
Total Bilirubin: 0.6 mg/dL (ref 0.3–1.2)
Total Protein: 7.6 g/dL (ref 6.0–8.3)

## 2014-05-18 LAB — RAPID URINE DRUG SCREEN, HOSP PERFORMED
Amphetamines: NOT DETECTED
Barbiturates: POSITIVE — AB
Benzodiazepines: NOT DETECTED
Cocaine: POSITIVE — AB
Opiates: NOT DETECTED
Tetrahydrocannabinol: NOT DETECTED

## 2014-05-18 LAB — CBC
HCT: 36.7 % — ABNORMAL LOW (ref 39.0–52.0)
HEMOGLOBIN: 11.7 g/dL — AB (ref 13.0–17.0)
MCH: 29.3 pg (ref 26.0–34.0)
MCHC: 31.9 g/dL (ref 30.0–36.0)
MCV: 92 fL (ref 78.0–100.0)
PLATELETS: 244 10*3/uL (ref 150–400)
RBC: 3.99 MIL/uL — ABNORMAL LOW (ref 4.22–5.81)
RDW: 13.2 % (ref 11.5–15.5)
WBC: 7.3 10*3/uL (ref 4.0–10.5)

## 2014-05-18 LAB — SALICYLATE LEVEL

## 2014-05-18 LAB — ETHANOL

## 2014-05-18 LAB — CBG MONITORING, ED
GLUCOSE-CAPILLARY: 118 mg/dL — AB (ref 70–99)
Glucose-Capillary: 126 mg/dL — ABNORMAL HIGH (ref 70–99)

## 2014-05-18 LAB — ACETAMINOPHEN LEVEL: Acetaminophen (Tylenol), Serum: <15 ug/mL (ref 10–30)

## 2014-05-18 MED ORDER — ALUM & MAG HYDROXIDE-SIMETH 200-200-20 MG/5ML PO SUSP: 30.0000 mL | ORAL | Status: DC | PRN

## 2014-05-18 MED ORDER — IBUPROFEN 200 MG PO TABS
600.0000 mg | ORAL_TABLET | Freq: Three times a day (TID) | ORAL | Status: DC | PRN
  Administered 2014-05-18: 600 mg via ORAL
  Filled 2014-05-18: qty 3

## 2014-05-18 MED ORDER — ONDANSETRON HCL 4 MG PO TABS: 4.0000 mg | ORAL_TABLET | Freq: Three times a day (TID) | ORAL | Status: DC | PRN

## 2014-05-18 MED ORDER — BENZTROPINE MESYLATE 1 MG PO TABS
2.0000 mg | ORAL_TABLET | Freq: Two times a day (BID) | ORAL | Status: DC
  Administered 2014-05-18 (×2): 2 mg via ORAL
  Filled 2014-05-18 (×2): qty 2

## 2014-05-18 MED ORDER — ACETAMINOPHEN 325 MG PO TABS
650.0000 mg | ORAL_TABLET | ORAL | Status: DC | PRN
  Administered 2014-05-18 (×2): 650 mg via ORAL
  Filled 2014-05-18 (×2): qty 2

## 2014-05-18 MED ORDER — LORAZEPAM 1 MG PO TABS
1.0000 mg | ORAL_TABLET | Freq: Three times a day (TID) | ORAL | Status: DC | PRN
  Administered 2014-05-18: 1 mg via ORAL
  Filled 2014-05-18: qty 1

## 2014-05-18 MED ORDER — ARIPIPRAZOLE 5 MG PO TABS
5.0000 mg | ORAL_TABLET | Freq: Two times a day (BID) | ORAL | Status: DC
  Administered 2014-05-18 (×2): 5 mg via ORAL
  Filled 2014-05-18 (×3): qty 1

## 2014-05-18 MED ORDER — HYDROCHLOROTHIAZIDE 25 MG PO TABS
25.0000 mg | ORAL_TABLET | Freq: Every day | ORAL | Status: DC
  Administered 2014-05-18: 25 mg via ORAL
  Filled 2014-05-18: qty 1

## 2014-05-18 MED ORDER — ZOLPIDEM TARTRATE 5 MG PO TABS: 5.0000 mg | ORAL_TABLET | Freq: Every evening | ORAL | Status: DC | PRN

## 2014-05-18 MED ORDER — NICOTINE 21 MG/24HR TD PT24
21.0000 mg | MEDICATED_PATCH | Freq: Every day | TRANSDERMAL | Status: DC
  Administered 2014-05-18: 21 mg via TRANSDERMAL
  Filled 2014-05-18: qty 1
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
## 2014-05-18 NOTE — Discharge Instructions (Signed)
Paranoia Paranoia is a distrust of others that is not based on a real reason for distrust. This may reach delusional levels. This means the paranoid person feels the world is against them when there is no reason to make them feel that way. People with paranoia feel as though people around them are "out to get them".  SIMILAR MENTAL ILNESSES  Depression is a feeling as though you are down all the time. It is normal in some situations where you have just lost a loved one. It is abnormal if you are having feelings of paranoia with it.  Dementia is a physical problem with the brain in which the brain no longer works properly. There are problems with daily activities of living. Alzheimer's disease is one example of this. Dementia is also caused by old age changes in the brain which come with the death of brain cells and small strokes.  Paranoidschizophrenia. People with paranoid schizophrenia and persecutory delusional disorder have delusions in which they feel people around them are plotting against them. Persecutory delusions in paranoid schizophrenia are bizarre, sometimes grandiose, and often accompanied by auditory hallucinations. This means the person is hearing voices that are not there.  Delusionaldisorder (persecutory type). Delusions experienced by individuals with delusional disorder are more believable than those experienced by paranoid schizophrenics; they are not bizarre, though still unjustified. Individuals with delusional disorder may seem offbeat or quirky rather than mentally ill, and therefore, may never seek treatment. All of these problems usually do not allow these people to interact socially in an acceptable manner. CAUSES The cause of paranoia is often not known. It is common in people with extended abuse of:  Cocaine.  Amphetamine.  Marijuana.  Alcohol. Sometimes there is an inherited tendency. It may be associated with stress or changes in brain chemistry. DIAGNOSIS    When paranoia is present, your caregiver may:  Refer you to a specialist.  Do a physical exam.  Perform other tests on you to make sure there are not other problems causing the paranoia including:  Physical problems.  Mental problems.  Chemical problems (other than drugs). Testing may be done to determine if there is a psychiatric disability present that can be treated with medicine. TREATMENT   Paranoia that is a symptom of a psychiatric problem should be treated by professionals.  Medicines are available which can help this disorder. Antipsychotic medicine may be prescribed by your caregiver.  Sometimes psychotherapy may be useful.  Conditions such as depression or drug abuse are treated individually. If the paranoia is caused by drug abuse, a treatment facility may be helpful. Depression may be helped by antidepressants. PROGNOSIS   Paranoid people are difficult to treat because of their belief that everyone is out to get them or harm them. Because of this mistrust, they often must be talked into entering treatment by a trusted family member or friend. They may not want to take medicine as they may see this as an attempt to poison them.  Gradual gains in the trust of a therapist or caregiver helps in a successful treatment plan.  Some people with PPD or persecutory delusional disorder function in society without treatment in limited fashion. Document Released: 06/20/2003 Document Revised: 09/09/2011 Document Reviewed: 02/23/2008 [facility] Patient Information 2015 [place], [place]. This information is not intended to replace advice given to you by your health care provider. Make sure you discuss any questions you have with your health care provider.  Psychosis Psychosis refers to a severe lack of understanding  with reality. During a psychotic episode, you are not able to think clearly. During a psychotic episode, your responses and emotions are inappropriate and do not coincide  with what is actually happening. You often have false beliefs about what is happening or who you are (delusions), and you may see, hear, taste, smell, or feel things that are not present (hallucinations). Psychosis is usually a severe symptom of a very serious mental health (psychiatric) condition, but it can sometimes be the result of a medical condition. CAUSES   Psychiatric conditions, such as:  Schizophrenia.  Bipolar disorder.  Depression.  Personality disorders.  Alcohol or drug abuse.  Medical conditions, such as:  Brain injury.  Brain tumor.  Dementia.  Brain diseases, such as Alzheimer's, Parkinson's, or Huntington's disease.  Neurological diseases, such as epilepsy.  Genetic disorders.  Metabolic disorders.  Infections that affect the brain.  Certain prescription drugs.  Stroke. SYMPTOMS   Unable to think or speak clearly or respond appropriately.  Disorganized thinking (thoughts jump from one thought to another).  Severe inappropriate behavior.  Delusions may include:  A strong belief that is odd, unrealistic, or false.  Feeling extremely fearful or suspicious (paranoid).  Believing you are someone else, have high importance, or have an altered identity.  Hallucinations. DIAGNOSIS   Mental health evaluation.  Physical exam.  Blood tests.  Computerized magnetic scan (MRI) or other brain scans. TREATMENT  Your caregiver will recommend a course of treatment that depends on the cause of the psychosis. Treatment may include:  Monitoring and supportive care in the hospital.  Taking medicines (antipsychotic medicine) to reduce symptoms and balance chemicals in the brain.  Taking medicines to manage underlying mental health conditions.  Therapy and other supportive programs outside of the hospital.  Treating an underlying medical condition. If the cause of the psychosis can be treated or corrected, the outlook is good. Without treatment,  psychotic episodes can cause danger to yourself or others. Treatment may be short-term or lifelong. HOME CARE INSTRUCTIONS   Take all medicines as directed. This is important.  Use a pillbox or write down your medicine schedule to make sure you are taking them.  Check with your caregiver before using over-the-counter medicines, herbs, or supplements.  Seek individual and family support through therapy and mental health education (psychoeducation) programs. These will help you manage symptoms and side effects of medicines, learn life skills, and maintain a healthy routine.  Maintain a healthy lifestyle.  Exercise regularly.  Avoid alcohol and drugs.  Learn ways to reduce stress and cope with stress, such as yoga and meditation.  Talk about your feelings with family members or caregivers.  Make time for yourself to do things you enjoy.  Know the early warning signs of psychosis. Your caregiver will recommend steps to take when you notice symptoms such as:  Feeling anxious or preoccupied.  Having racing thoughts.  Changes in your interest in life and relationships.  Follow up with your caregivers for continued outpatient treatment as directed. SEEK MEDICAL CARE IF:   Medicines do not seem to be helping.  You hear voices telling you to do things.  You see, smell, or feel things that are not there.  You feel hopeless and overwhelmed.  You feel extremely fearful and suspicious that something will harm you.  You feel like you cannot leave your house.  You have trouble taking care of yourself.  You experience side effects of medicines, such as changes in sleep patterns, dizziness, weight gain, restlessness,  movement changes, muscle spasms, or tremors. SEEK IMMEDIATE MEDICAL CARE IF:  Severe psychotic symptoms present a safety issue (such as an urge to hurt yourself or others). MAKE SURE YOU:   Understand these instructions.  Will watch your condition.  Will get help  right away if you are not doing well or get worse. FOR MORE INFORMATION  National Institute of Mental Health: http://www.maynard.net/www.nimh.nih.gov Document Released: 12/05/2009 Document Revised: 09/09/2011 Document Reviewed: 12/05/2009 [facility] Patient Information 2015 [place], [place]. This information is not intended to replace advice given to you by your health care provider. Make sure you discuss any questions you have with your health care provider.

## 2014-05-18 NOTE — BH Assessment (Signed)
Inpt recommended. [facility]. Sent referrals to the following: [facility],  [facility], [facility],  [facility]  Cecillia Menees, [place] Triage Specialist 05/18/2014 6:09 AM
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
## 2014-05-18 NOTE — [facility] Suicide Risk Assessment (Cosign Needed)
Suicide Risk Assessment  Discharge Assessment     Demographic Factors:  Male  Total Time spent with patient: 30 minutes  Psychiatric Specialty Exam:     Blood pressure 153/85, pulse 75, temperature 97.9 F (36.6 C), temperature source Oral, resp. rate 18, SpO2 96 %.There is no weight on file to calculate BMI.  General Appearance: Casual  Eye Contact:: Good  Speech: Clear and Coherent and Normal Rate  Volume: Normal  Mood: Anxious  Affect: Congruent  Thought Process: Circumstantial and Goal Directed  Orientation: Full (Time, Place, and Person)  Thought Content: Rumination  Suicidal Thoughts: No  Homicidal Thoughts: No  Memory: Immediate; Good Recent; Good Remote; Good  Judgement: Intact  Insight: Fair  Psychomotor Activity: Normal  Concentration: Fair  Recall: Good  Fund of Knowledge:Good  Language: Good  Akathisia: No  Handed: Right  AIMS (if indicated):    Assets: Communication Skills Desire for Improvement Social Support  Sleep:     Musculoskeletal: Strength & Muscle Tone: within normal limits Gait & Station: normal Patient leans: N/A   Mental Status Per Nursing Assessment::   On Admission:     Current Mental Status by Physician: denies suicidal/homicidal ideation, psychosis, and paranoia  Loss Factors: NA  Historical Factors: NA  Risk Reduction Factors:   Positive social support  Continued Clinical Symptoms:  Alcohol/Substance Abuse/Dependencies Previous Psychiatric Diagnoses and Treatments  Cognitive Features That Contribute To Risk:  Closed-mindedness    Suicide Risk:  Minimal: No identifiable suicidal ideation.  Patients presenting with no risk factors but with morbid ruminations; may be classified as minimal risk based on the severity of the depressive symptoms  Discharge Diagnoses:  AXIS I: Substance Abuse and Substance Induced Mood Disorder AXIS II: Deferred AXIS III:  Past  Medical History  Diagnosis Date  . Diabetes mellitus without complication   . Hypertension   . Schizophrenia   . CHF (congestive heart failure)   . Neuropathy    AXIS IV: other psychosocial or environmental problems and problems related to social environment AXIS V: 61-70 mild symptoms    Plan Of Care/Follow-up recommendations:  Activity:  as tolerated Diet:  as tolerated  Is patient on multiple antipsychotic therapies at discharge:  No   Has Patient had three or more failed trials of antipsychotic monotherapy by history:  No  Recommended Plan for Multiple Antipsychotic Therapies: NA    Holy Battenfield, FNP-BC 05/18/2014, 2:14 PM
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
## 2014-05-18 NOTE — ED Provider Notes (Signed)
CSN: [id number]     Arrival date & time 05/18/14  [id number]   First MD Initiated Contact with Patient 05/18/14 0236     Chief Complaint  Patient presents with  . Hallucinations     HPI Pt reports hallucinations, increasing agitation and thoughts of homicide. Auditory hallucinations. Reports hx of schizophrenia. Reports he is in to "witchcraft" and "socery". Admits to crack use   Past Medical History  Diagnosis Date  . Diabetes mellitus without complication   . Hypertension   . Schizophrenia   . CHF (congestive heart failure)   . Neuropathy    History reviewed. No pertinent past surgical history. Family History  Problem Relation Age of Onset  . Hypertension Other   . Diabetes Other    History  Substance Use Topics  . Smoking status: Current Every Day Smoker -- 1.00 packs/day for 20 years    Types: Cigarettes  . Smokeless tobacco: Current User  . Alcohol Use: Yes     Comment: 40oz on 04/01/14    Review of Systems  All other systems reviewed and are negative.     Allergies  Haldol  Home Medications   Prior to Admission medications   Medication Sig Start Date End Date Taking? Authorizing Provider  ARIPiprazole (ABILIFY) 5 MG tablet Take 1 tablet (5 mg total) by mouth 2 (two) times daily. 05/16/14  Yes Velna HatchetSheila May Agustin, NP  benztropine (COGENTIN) 2 MG tablet Take 1 tablet (2 mg total) by mouth 2 (two) times daily. 05/16/14  Yes Velna HatchetSheila May Agustin, NP  fluPHENAZine decanoate (PROLIXIN) 25 MG/ML injection Inject 25 mg into the muscle every 14 (fourteen) days.   Yes Historical Provider, MD  hydrochlorothiazide (HYDRODIURIL) 25 MG tablet Take 1 tablet (25 mg total) by mouth daily. 05/16/14  Yes Velna HatchetSheila May Agustin, NP  zolpidem (AMBIEN) 5 MG tablet Take 1 tablet (5 mg total) by mouth at bedtime as needed for sleep. 05/16/14  Yes Velna HatchetSheila May Agustin, NP   BP 163/86 mmHg  Pulse 85  Temp(Src) 98.2 F (36.8 C) (Oral)  Resp 18  SpO2 100% Physical Exam   Constitutional: He is oriented to person, place, and time. He appears well-developed and well-nourished.  HENT:  Head: Normocephalic and atraumatic.  Eyes: EOM are normal.  Neck: Normal range of motion.  Cardiovascular: Normal rate, regular rhythm, normal heart sounds and intact distal pulses.   Pulmonary/Chest: Effort normal and breath sounds normal. No respiratory distress.  Abdominal: Soft. He exhibits no distension. There is no tenderness.  Musculoskeletal: Normal range of motion.  Neurological: He is alert and oriented to person, place, and time.  Skin: Skin is warm and dry.  Psychiatric: His mood appears anxious. His speech is rapid and/or pressured. He is agitated. Thought content is paranoid. He expresses homicidal ideation.  Nursing note and vitals reviewed.   ED Course  Procedures (including critical care time) Labs Review Labs Reviewed  CBC - Abnormal; Notable for the following:    RBC 3.99 (*)    Hemoglobin 11.7 (*)    HCT 36.7 (*)    All other components within normal limits  ACETAMINOPHEN LEVEL  COMPREHENSIVE METABOLIC PANEL  ETHANOL  SALICYLATE LEVEL  URINE RAPID DRUG SCREEN (HOSP PERFORMED)    Imaging Review No results found.   EKG Interpretation None      MDM   Final diagnoses:  Acute psychosis    Difficult pt. To disposition. Will ask TTS to evaluate the pt. Will likely benefit from AM psychiatrist seeing  the pt    Lyanne CoKevin M Muhammad Vacca, MD 05/18/14 [phone]

## 2014-05-18 NOTE — ED Notes (Signed)
[facility] and reviewed with understanding stated. Denies SI, HI and AVH. Rates pain at about a 3 for chronic neuropathy pain in his feet. Ambulatory without difficulty. Belongings returned. Bus pass provided. Escorted by mental health tech to front of the hospital.

## 2014-05-18 NOTE — ED Notes (Signed)
States sometimes he "blacks out from using crack". "I've got to get off the streets."

## 2014-05-19 NOTE — Progress Notes (Signed)
Patient Discharge Instructions:  After Visit Summary (AVS):   Faxed to:  05/19/14 Discharge Summary Note:   Faxed to:  05/19/14 Psychiatric Admission Assessment Note:   Faxed to:  05/19/14 Suicide Risk Assessment - Discharge Assessment:   Faxed to:  05/19/14 Faxed/Sent to the Next Level Care provider:  05/19/14 [facility] @ [phone]  Jerelene ReddenSheena E Rouse, 05/19/2014, 2:03 PM

## 2014-05-20 ENCOUNTER — Encounter (HOSPITAL_COMMUNITY): Payer: Self-pay | Admitting: *Deleted

## 2014-05-20 ENCOUNTER — Emergency Department (EMERGENCY_DEPARTMENT_HOSPITAL)
Admission: EM | Admit: 2014-05-20 | Discharge: 2014-05-20 | Disposition: A | Discharge status: Other Acute Inpatient Hospital | Payer: Medicare Other | Source: Home / Self Care | Attending: Emergency Medicine | Admitting: Emergency Medicine

## 2014-05-20 ENCOUNTER — Encounter (HOSPITAL_COMMUNITY): Payer: Self-pay | Admitting: Emergency Medicine

## 2014-05-20 ENCOUNTER — Observation Stay (HOSPITAL_COMMUNITY)
Admission: AD | Admit: 2014-05-20 | Discharge: 2014-05-21 | Discharge status: Against Medical Advice | Payer: Medicare Other | Source: Intra-hospital | Attending: Psychiatry | Admitting: Psychiatry

## 2014-05-20 DIAGNOSIS — F419 Anxiety disorder, unspecified: Secondary | ICD-10-CM

## 2014-05-20 DIAGNOSIS — Z79899 Other long term (current) drug therapy: Secondary | ICD-10-CM | POA: Insufficient documentation

## 2014-05-20 DIAGNOSIS — F1420 Cocaine dependence, uncomplicated: Principal | ICD-10-CM | POA: Insufficient documentation

## 2014-05-20 DIAGNOSIS — F22 Delusional disorders: Secondary | ICD-10-CM | POA: Diagnosis present

## 2014-05-20 DIAGNOSIS — I509 Heart failure, unspecified: Secondary | ICD-10-CM | POA: Insufficient documentation

## 2014-05-20 DIAGNOSIS — Z720 Tobacco use: Secondary | ICD-10-CM | POA: Insufficient documentation

## 2014-05-20 DIAGNOSIS — F1994 Other psychoactive substance use, unspecified with psychoactive substance-induced mood disorder: Secondary | ICD-10-CM | POA: Diagnosis present

## 2014-05-20 DIAGNOSIS — F200 Paranoid schizophrenia: Secondary | ICD-10-CM | POA: Insufficient documentation

## 2014-05-20 DIAGNOSIS — I10 Essential (primary) hypertension: Secondary | ICD-10-CM | POA: Insufficient documentation

## 2014-05-20 DIAGNOSIS — F1410 Cocaine abuse, uncomplicated: Secondary | ICD-10-CM | POA: Diagnosis present

## 2014-05-20 DIAGNOSIS — E119 Type 2 diabetes mellitus without complications: Secondary | ICD-10-CM | POA: Insufficient documentation

## 2014-05-20 DIAGNOSIS — Z8669 Personal history of other diseases of the nervous system and sense organs: Secondary | ICD-10-CM

## 2014-05-20 LAB — RAPID URINE DRUG SCREEN, HOSP PERFORMED
Amphetamines: NOT DETECTED
BARBITURATES: NOT DETECTED
BENZODIAZEPINES: NOT DETECTED
Cocaine: POSITIVE — AB
Opiates: NOT DETECTED
Tetrahydrocannabinol: NOT DETECTED

## 2014-05-20 LAB — CBC WITH DIFFERENTIAL/PLATELET
Basophils Absolute: 0 10*3/uL (ref 0.0–0.1)
Basophils Relative: 0 % (ref 0–1)
Eosinophils Absolute: 0.1 10*3/uL (ref 0.0–0.7)
Eosinophils Relative: 2 % (ref 0–5)
HEMATOCRIT: 35.6 % — AB (ref 39.0–52.0)
HEMOGLOBIN: 11.5 g/dL — AB (ref 13.0–17.0)
LYMPHS ABS: 1.5 10*3/uL (ref 0.7–4.0)
Lymphocytes Relative: 27 % (ref 12–46)
MCH: 29.7 pg (ref 26.0–34.0)
MCHC: 32.3 g/dL (ref 30.0–36.0)
MCV: 92 fL (ref 78.0–100.0)
MONO ABS: 0.4 10*3/uL (ref 0.1–1.0)
MONOS PCT: 8 % (ref 3–12)
NEUTROS ABS: 3.4 10*3/uL (ref 1.7–7.7)
NEUTROS PCT: 63 % (ref 43–77)
Platelets: 250 10*3/uL (ref 150–400)
RBC: 3.87 MIL/uL — AB (ref 4.22–5.81)
RDW: 13.4 % (ref 11.5–15.5)
WBC: 5.4 10*3/uL (ref 4.0–10.5)

## 2014-05-20 LAB — COMPREHENSIVE METABOLIC PANEL
ALK PHOS: 84 U/L (ref 39–117)
ALT: 8 U/L (ref 0–53)
ANION GAP: 12 (ref 5–15)
AST: 22 U/L (ref 0–37)
Albumin: 4 g/dL (ref 3.5–5.2)
BUN: 18 mg/dL (ref 6–23)
CHLORIDE: 102 meq/L (ref 96–112)
CO2: 28 meq/L (ref 19–32)
Calcium: 9.7 mg/dL (ref 8.4–10.5)
Creatinine, Ser: 0.86 mg/dL (ref 0.50–1.35)
GLUCOSE: 122 mg/dL — AB (ref 70–99)
Potassium: 3.7 mEq/L (ref 3.7–5.3)
Sodium: 142 mEq/L (ref 137–147)
Total Bilirubin: 0.6 mg/dL (ref 0.3–1.2)
Total Protein: 7.2 g/dL (ref 6.0–8.3)

## 2014-05-20 LAB — ETHANOL: Alcohol, Ethyl (B): <11 mg/dL (ref 0–11)

## 2014-05-20 LAB — ACETAMINOPHEN LEVEL: Acetaminophen (Tylenol), Serum: <15 ug/mL (ref 10–30)

## 2014-05-20 LAB — SALICYLATE LEVEL

## 2014-05-20 MED ORDER — BENZTROPINE MESYLATE 2 MG PO TABS
2.0000 mg | ORAL_TABLET | Freq: Two times a day (BID) | ORAL | Status: DC
  Administered 2014-05-20: 2 mg via ORAL
  Filled 2014-05-20: qty 1
  Filled 2014-05-20: qty 2
  Filled 2014-05-20 (×3): qty 1

## 2014-05-20 MED ORDER — ARIPIPRAZOLE 5 MG PO TABS
5.0000 mg | ORAL_TABLET | Freq: Two times a day (BID) | ORAL | Status: DC
  Administered 2014-05-20: 5 mg via ORAL
  Filled 2014-05-20 (×2): qty 1

## 2014-05-20 MED ORDER — IBUPROFEN 200 MG PO TABS
600.0000 mg | ORAL_TABLET | Freq: Three times a day (TID) | ORAL | Status: DC | PRN
Start: 2014-05-20 — End: 2014-05-20
  Administered 2014-05-20: 600 mg via ORAL
  Filled 2014-05-20 (×2): qty 3

## 2014-05-20 MED ORDER — ONDANSETRON HCL 4 MG PO TABS: 4.0000 mg | ORAL_TABLET | Freq: Three times a day (TID) | ORAL | Status: DC | PRN

## 2014-05-20 MED ORDER — ACETAMINOPHEN 325 MG PO TABS
650.0000 mg | ORAL_TABLET | ORAL | Status: DC | PRN
  Administered 2014-05-20 – 2014-05-21 (×3): 650 mg via ORAL
  Filled 2014-05-20 (×3): qty 2

## 2014-05-20 MED ORDER — NICOTINE 21 MG/24HR TD PT24: 21.0000 mg | MEDICATED_PATCH | Freq: Every day | TRANSDERMAL | Status: DC

## 2014-05-20 MED ORDER — ALUM & MAG HYDROXIDE-SIMETH 200-200-20 MG/5ML PO SUSP: 30.0000 mL | ORAL | Status: DC | PRN

## 2014-05-20 MED ORDER — LORAZEPAM 1 MG PO TABS
1.0000 mg | ORAL_TABLET | Freq: Three times a day (TID) | ORAL | Status: DC | PRN
  Administered 2014-05-20: 1 mg via ORAL
  Filled 2014-05-20: qty 2

## 2014-05-20 MED ORDER — ZOLPIDEM TARTRATE 5 MG PO TABS
5.0000 mg | ORAL_TABLET | Freq: Every evening | ORAL | Status: DC | PRN
Start: 2014-05-20 — End: 2014-05-21

## 2014-05-20 MED ORDER — HYDROCHLOROTHIAZIDE 25 MG PO TABS
25.0000 mg | ORAL_TABLET | Freq: Every day | ORAL | Status: DC
  Filled 2014-05-20 (×2): qty 1

## 2014-05-20 MED ORDER — ZOLPIDEM TARTRATE 5 MG PO TABS: 5.0000 mg | ORAL_TABLET | Freq: Every evening | ORAL | Status: DC | PRN

## 2014-05-20 MED ORDER — HYDROCHLOROTHIAZIDE 25 MG PO TABS
25.0000 mg | ORAL_TABLET | Freq: Every day | ORAL | Status: DC
  Administered 2014-05-20: 25 mg via ORAL
  Filled 2014-05-20: qty 1

## 2014-05-20 MED ORDER — ARIPIPRAZOLE 5 MG PO TABS
5.0000 mg | ORAL_TABLET | Freq: Two times a day (BID) | ORAL | Status: DC
  Administered 2014-05-20: 5 mg via ORAL
  Filled 2014-05-20 (×5): qty 1

## 2014-05-20 MED ORDER — NICOTINE 21 MG/24HR TD PT24
21.0000 mg | MEDICATED_PATCH | Freq: Every day | TRANSDERMAL | Status: DC
  Administered 2014-05-20: 21 mg via TRANSDERMAL
  Filled 2014-05-20: qty 1

## 2014-05-20 MED ORDER — NICOTINE 21 MG/24HR TD PT24
MEDICATED_PATCH | TRANSDERMAL | Status: AC
  Filled 2014-05-20: qty 1

## 2014-05-20 MED ORDER — IBUPROFEN 600 MG PO TABS: 600.0000 mg | ORAL_TABLET | Freq: Three times a day (TID) | ORAL | Status: DC | PRN

## 2014-05-20 MED ORDER — BENZTROPINE MESYLATE 1 MG PO TABS
2.0000 mg | ORAL_TABLET | Freq: Two times a day (BID) | ORAL | Status: DC
  Administered 2014-05-20 (×2): 2 mg via ORAL
  Filled 2014-05-20 (×2): qty 2

## 2014-05-20 MED ORDER — LORAZEPAM 1 MG PO TABS: 1.0000 mg | ORAL_TABLET | Freq: Three times a day (TID) | ORAL | Status: DC | PRN

## 2014-05-20 MED ORDER — NICOTINE 21 MG/24HR TD PT24
21.0000 mg | MEDICATED_PATCH | Freq: Every day | TRANSDERMAL | Status: DC
Start: 2014-05-21 — End: 2014-05-21
  Administered 2014-05-20: 21 mg via TRANSDERMAL
  Filled 2014-05-20 (×2): qty 1

## 2014-05-20 NOTE — BH Assessment (Signed)
[facility] Assessment Progress Note    [facility] by Nanine MeansJamison Lord, NP.  At 14:21 pt signed Voluntary Admission and Consent for Treatment, along with Consent to Release Information.  [facility]  Pt's nurse, Clydie BraunKaren, was notified, and she agrees to call report to [phone] or [phone].  Doylene Canninghomas Kimani Hovis, MA Triage Specialist 05/20/2014 @ 16:36

## 2014-05-20 NOTE — ED Notes (Signed)
Pt brought in by [employer] voluntarily  [facility] but they state they are unable to keep him there due to blisters on his feet and multiple health problems  Per police pt is feeling homicidal tonight  Pt states he does not want to go back out into society and he wants to be put away for life  Pt states people dont like him and he doesn't like people and if he is put back out he will kill someone  Pt is agitated in words and actions during conversation

## 2014-05-20 NOTE — BH Assessment (Signed)
Tele Assessment Note   Taylor DouglasFredrick L [name] is an 53 y.o. male [facility] reporting HI with a plan to shoot anyone that cross him. Pt stated "I just left behavioral health but I wasn't prepared to leave". Pt reported that he just wanted to smoke. Pt also stated "I want to be put away for life; I want to be sent to the state hospital". "I don't have a soul in this world; I don't ever want to go back into society". "[place]  Pt denies SI at this time but stated "if I don't get help I will find some fucking way to kill myself". Pt did not report previous suicide attempts but shared that he has been hospitalized multiple times in the past. Pt reported that he is currently receiving mental health services through [facility]. Pt is endorsing HI and stated "I will shoot the ones that double cross me". Pt reported that he does not own any weapons or firearms but stated "I can get access; I only have to pay $100 or $200". Pt stated "I want to get a 38 and I will empty 1,000 bullets in their ass". PT denies AVH at this time but shared that he does see demons and angels fighting in the sky. Pt stated "spiritual warfare". Pt also began talking about magic spells, torture and voodoo. Pt reported that he has had snakes in his mouth, "ass", and bed". Pt also quoted Psalms 91 after he stated "they put the snakes in my belly and mouth". Pt stated "I use small amounts of alcohol and drugs but did not share any additional details. Pt did not report any physical, sexual or emotional abuse at this time. Pt is alert and oriented x3. Pt is calm and cooperative at this time. Pt maintained fair eye contact throughout this assessment and his speech is rapid and pressured. Pt thought process is circumstantial. Pt mood is anxious, labile and his affect is congruent with his mood. Inpatient treatment is recommended.    Axis I: Paranoid schizophrenia; cocaine abuse   Past Medical History:   Past Medical History  Diagnosis Date  . Diabetes mellitus without complication   . Hypertension   . Schizophrenia   . CHF (congestive heart failure)   . Neuropathy     History reviewed. No pertinent past surgical history.  Family History:  Family History  Problem Relation Age of Onset  . Hypertension Other   . Diabetes Other     Social History:  reports that he has been smoking Cigarettes.  He has a 20 pack-year smoking history. He uses smokeless tobacco. He reports that he drinks alcohol. He reports that he uses illicit drugs ("Crack" cocaine, Cocaine, and Marijuana) about 7 times per week.  Additional Social History:  Alcohol / Drug Use History of alcohol / drug use?: Yes Longest period of sobriety (when/how long): Only when in detox  Negative Consequences of Use: Work / Programmer, multimediachool, Copywriter, advertisingersonal relationships, Armed forces operational officerLegal Substance #1 Name of Substance 1: Cocaine 1 - Age of First Use: Unknown 1 - Amount (size/oz): 1 gram per day  1 - Frequency: Daly use 1 - Duration: On-going 1 - Last Use / Amount: UDS-positive  Substance #2 Name of Substance 2: THC 2 - Age of First Use: 12 2 - Amount (size/oz): varies 2 - Frequency: unknown 2 - Duration: on/off years  2 - Last Use / Amount: no use in three months reports he felt like he would  die last use and will not use again Substance #3 Name of Substance 3: etoh 3 - Age of First Use: 8 3 - Amount (size/oz): 1 beer or glass of wine 3 - Frequency: "not often, special occasions" 3 - Duration: on/off for years, longest sobriety 90 days 3 - Last Use / Amount: unknown  CIWA: CIWA-Ar BP: 149/77 mmHg Pulse Rate: 79 COWS:    PATIENT STRENGTHS: (choose at least two) Average or above average intelligence Motivation for treatment/growth  Allergies:  Allergies  Allergen Reactions  . Haldol [Haloperidol] Other (See Comments)    Muscle spasms, loss of voluntary movement. However, pt has taken Thorazine on multiple occasions with no adverse  effects.     Home Medications:  (Not in a hospital admission)  OB/GYN Status:  No LMP for male patient.  General Assessment Data Location of Assessment: [facility] Is this a Tele or Face-to-Face Assessment?: Face-to-Face Is this an Initial Assessment or a Re-assessment for this encounter?: Initial Assessment Living Arrangements: Other (Comment) (Homeless) Can pt return to current living arrangement?: Yes Admission Status: Voluntary Is patient capable of signing voluntary admission?: Yes Transfer from: Home Referral Source: Self/Family/Friend     [facility] Crisis Care Plan Living Arrangements: Other (Comment) (Homeless) Name of Psychiatrist: Transport planner Name of Therapist: ACTT initiated through [employer]   Education Status Is patient currently in school?: No Current Grade: NA Highest grade of school patient has completed: 12th grade Name of school: None  Contact person: None   Risk to self with the past 6 months Suicidal Ideation: Yes-Currently Present Suicidal Intent: No-Not Currently/Within Last 6 Months Is patient at risk for suicide?: No Suicidal Plan?: No Specify Current Suicidal Plan: NA Access to Means: No Specify Access to Suicidal Means: NA What has been your use of drugs/alcohol within the last 12 months?: Pt reported that he uses crack and drinks alcohol Previous Attempts/Gestures: No How many times?: 0 Other Self Harm Risks: No other self harm risk identified at this time. Triggers for Past Attempts: None known Intentional Self Injurious Behavior: None Comment - Self Injurious Behavior: NA Family Suicide History: No Recent stressful life event(s): Other (Comment) Persecutory voices/beliefs?: No Depression: Yes Depression Symptoms: Despondent, Tearfulness, Fatigue, Feeling angry/irritable Substance abuse history and/or treatment for substance abuse?: Yes Suicide prevention information given to non-admitted patients: Not applicable  Risk to Others within the past 6  months Homicidal Ideation: Yes-Currently Present Thoughts of Harm to Others: Yes-Currently Present Comment - Thoughts of Harm to Others: "I will shoot them" Current Homicidal Intent: Yes-Currently Present Current Homicidal Plan: Yes-Currently Present Describe Current Homicidal Plan: "I will shoot them" Access to Homicidal Means: No Describe Access to Homicidal Means: Pt denies having access to weapons but reported that he could buy one.  Identified Victim: "the people that cross me". History of harm to others?: No Assessment of Violence: None Noted Violent Behavior Description: No violent behaviors observed. Pt is calm and cooperative at this time.  Does patient have access to weapons?: No Criminal Charges Pending?: No Describe Pending Criminal Charges: NA Does patient have a court date: No  Psychosis Hallucinations: Visual Delusions: None noted  Mental Status Report Appear/Hygiene: Disheveled, Body odor, Poor hygiene Eye Contact: Fair Motor Activity: Freedom of movement Speech: Rapid, Pressured Level of Consciousness: Alert Mood: Labile, Anxious Affect: Anxious, Depressed Anxiety Level: Moderate Thought Processes: Circumstantial Judgement: Impaired Orientation: Person, Place, Time, Situation Obsessive Compulsive Thoughts/Behaviors: Minimal  Cognitive Functioning Concentration: Decreased Memory: Recent Intact IQ: Average Insight: Poor Impulse Control: Poor Appetite:  Fair Weight Loss: 0 Weight Gain: 0 Sleep: Decreased Total Hours of Sleep: 4 Vegetative Symptoms: None  ADLScreening [facility]) Patient's cognitive ability adequate to safely complete daily activities?: Yes Patient able to express need for assistance with ADLs?: Yes Independently performs ADLs?: Yes (appropriate for developmental age)  Prior Inpatient Therapy Prior Inpatient Therapy: Yes Prior Therapy Dates: 2001,2005,2009,2010,2014,2015 [facility] Therapy [facility]ilty/Provider(s[facility]  Reason for Treatment: Schizophrenia  Prior Outpatient Therapy Prior Outpatient Therapy: Yes Prior Therapy Dates: Current  Prior Therapy Facilty/Provider(s): [name]  Reason for Treatment: Med Mgt   ADL Screening (condition at time of admission) Patient's cognitive ability adequate to safely complete daily activities?: Yes Is the patient deaf or have difficulty hearing?: No Does the patient have difficulty seeing, even when wearing glasses/contacts?: No Does the patient have difficulty concentrating, remembering, or making decisions?: Yes Patient able to express need for assistance with ADLs?: Yes Does the patient have difficulty dressing or bathing?: No Independently performs ADLs?: Yes (appropriate for developmental age)       Abuse/Neglect Assessment (Assessment to be complete while patient is alone) Physical Abuse: Denies Verbal Abuse: Denies Sexual Abuse: Denies Exploitation of patient/patient's resources: Denies Self-Neglect: Denies     Merchant navy officerAdvance Directives (For Healthcare) Does patient have an advance directive?: No Would patient like information on creating an advanced directive?: No - patient declined information    Additional Information 1:1 In Past 12 Months?: No CIRT Risk: No Elopement Risk: No Does patient have medical clearance?: Yes     Disposition:  Disposition Initial Assessment Completed for this Encounter: Yes Disposition of Patient: Inpatient treatment program, Referred to Type of inpatient treatment program: Adult Other disposition(s):  (Pt needs to be referred to another inpatient facility.) Patient referred to:  (Refer to other outpatient facilities.)  Terry Bolotin S 05/20/2014 3:54 AM

## 2014-05-20 NOTE — ED Provider Notes (Signed)
CSN: [id number]     Arrival date & time 05/20/14  0209 History   First MD Initiated Contact with Patient 05/20/14 0216     Chief Complaint  Patient presents with  . Medical Clearance     (Consider location/radiation/quality/duration/timing/severity/associated sxs/prior Treatment) HPI Comments: Patient presents tonight extremely agitated, threatening homicide against multiple people.  No one specifically, he states, "I want to be put away for life."  The history is provided by the patient and the police.    Past Medical History  Diagnosis Date  . Diabetes mellitus without complication   . Hypertension   . Schizophrenia   . CHF (congestive heart failure)   . Neuropathy    History reviewed. No pertinent past surgical history. Family History  Problem Relation Age of Onset  . Hypertension Other   . Diabetes Other    History  Substance Use Topics  . Smoking status: Current Every Day Smoker -- 1.00 packs/day for 20 years    Types: Cigarettes  . Smokeless tobacco: Current User  . Alcohol Use: Yes     Comment: 40oz on 04/01/14    Review of Systems  Constitutional: Negative for fever.  Respiratory: Negative for shortness of breath.   Cardiovascular: Negative for chest pain.  Neurological: Negative for dizziness and weakness.  Psychiatric/Behavioral: Positive for agitation.  All other systems reviewed and are negative.     Allergies  Haldol  Home Medications   Prior to Admission medications   Medication Sig Start Date End Date Taking? Authorizing Provider  ARIPiprazole (ABILIFY) 5 MG tablet Take 1 tablet (5 mg total) by mouth 2 (two) times daily. 05/16/14   Velna HatchetSheila May Agustin, NP  benztropine (COGENTIN) 2 MG tablet Take 1 tablet (2 mg total) by mouth 2 (two) times daily. 05/16/14   Velna HatchetSheila May Agustin, NP  fluPHENAZine decanoate (PROLIXIN) 25 MG/ML injection Inject 25 mg into the muscle every 14 (fourteen) days.    Historical Provider, MD  hydrochlorothiazide  (HYDRODIURIL) 25 MG tablet Take 1 tablet (25 mg total) by mouth daily. 05/16/14   Velna HatchetSheila May Agustin, NP  zolpidem (AMBIEN) 5 MG tablet Take 1 tablet (5 mg total) by mouth at bedtime as needed for sleep. 05/16/14   Velna HatchetSheila May Agustin, NP   BP 149/77 mmHg  Pulse 79  Temp(Src) 98.1 F (36.7 C) (Oral)  Resp 16  SpO2 98% Physical Exam  Constitutional: He is oriented to person, place, and time. He appears well-developed and well-nourished.  HENT:  Head: Normocephalic.  Cardiovascular: Normal rate.   Pulmonary/Chest: Effort normal.  Musculoskeletal: Normal range of motion.  Neurological: He is alert and oriented to person, place, and time.  Skin: Skin is warm.  Psychiatric: His mood appears anxious. His speech is rapid and/or pressured. He is agitated. He expresses impulsivity and inappropriate judgment. He expresses homicidal ideation. He expresses homicidal plans.  Nursing note and vitals reviewed.   ED Course  Procedures (including critical care time) Labs Review Labs Reviewed  CBC WITH DIFFERENTIAL  COMPREHENSIVE METABOLIC PANEL  ETHANOL  URINE RAPID DRUG SCREEN (HOSP PERFORMED)  SALICYLATE LEVEL  ACETAMINOPHEN LEVEL    Imaging Review No results found.   EKG Interpretation None      MDM   Final diagnoses:  None         Arman FilterGail K Declyn Offield, NP 05/20/14 [id number]  Olivia Mackielga M Otter, MD 05/20/14 [phone]

## 2014-05-20 NOTE — ED Notes (Signed)
TTS in with pt. 
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
## 2014-05-20 NOTE — Progress Notes (Signed)
[facility]  [name], MSW Social Worker [phone]

## 2014-05-20 NOTE — BH Assessment (Signed)
Assessment completed. Consulted Verne SpurrNeil Mashburn, PA-C who recommended inpatient treatment. [facility]. TTS will contact other facilities for placement. Sharen HonesGail Schultz, NP has been informed of the recommendation.

## 2014-05-20 NOTE — Progress Notes (Signed)
[name] was calm and cooperative, and in NAD at time of discharge. He was escorted from unit with [employer] [employer]. He received AVS and belongings. [facility] to [facility].

## 2014-05-20 NOTE — ED Notes (Addendum)
Presents with HI thoughts, no specific plan, pt is homeless, complaining of bilateral foot pain.  Pt cooperative at present.  [facility]

## 2014-05-20 NOTE — ED Notes (Signed)
FNP at bedside.
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
## 2014-05-21 NOTE — Progress Notes (Signed)
Patient ID: Taylor DouglasFredrick L Derksen, male   DOB: 02/19/1961, 53 y.o.   MRN: [id number] Patient became agitated, refused his medicine and began cursing at staff in unit.  He called this Clinical research associatewriter "a fucking, stinking white ass bitch". He stated "I'm not suicidal, homicidal and I ain't hearin' no voices.  I'm gonna leave". Discussed with Berneice Heinrichina Tate AC.  Patient given AMA paper to sign.  Patients belongings were returned. Patient escorted by security to lobby and given bus pass.

## 2014-05-21 NOTE — Progress Notes (Signed)
Patient was met awake watching TV. Patient complained of increased appetite, requesting food/snack/milk and drinks at all times. When pt was told he can no longer have milk (gotten 5 boxes of milk within 3 hrs of shift change) because of frequent passing of gas, he became angry stated "My [employer] insurance paid for all those food". Pt also demanding for coffee and sweet. Was told that he can get coffee in the morning.  Patient asked the writer to call the Dr. to prescribed him some sweets. On call extender Theodoro Clock NP was made aware - no action taken.  At 10:00pm and 3 am, patient complained of headache 8/10 and mouth pain of 8/10 respectively. PRN of Acetaminophen 650 mg given - both effective. Patient denies SI/AH/VH at this time but reports that "he is NOT ok to go home". He continues to be demanding for food and drinks. Sleeps on/ off. Refuses Ambien stated " I snores, I sleep well. i don't need that". Awake at this time. Will continue to monitor patient.

## 2014-05-21 NOTE — Progress Notes (Signed)
Pt awake and irritable. Continues to request additional breakfast trays. Pt was told he could not get an additional tray because he is diabetic. Pt becoming increasingly irritable stating "ya'll are some hateful ass white women. Prejudice mother fuckers. All over a breakfast tray. Let me get the fuck out of here. And I'm not coming back either. I hope you fucking die."

## 2014-05-31 ENCOUNTER — Emergency Department (HOSPITAL_COMMUNITY)
Admission: EM | Admit: 2014-05-31 | Discharge: 2014-06-01 | Disposition: A | Discharge status: Home / Self Care | Payer: Medicare Other | Attending: Emergency Medicine | Admitting: Emergency Medicine

## 2014-05-31 ENCOUNTER — Encounter (HOSPITAL_COMMUNITY): Payer: Self-pay | Admitting: Emergency Medicine

## 2014-05-31 DIAGNOSIS — I10 Essential (primary) hypertension: Secondary | ICD-10-CM | POA: Insufficient documentation

## 2014-05-31 DIAGNOSIS — Z720 Tobacco use: Secondary | ICD-10-CM | POA: Insufficient documentation

## 2014-05-31 DIAGNOSIS — F209 Schizophrenia, unspecified: Secondary | ICD-10-CM | POA: Insufficient documentation

## 2014-05-31 DIAGNOSIS — M79672 Pain in left foot: Secondary | ICD-10-CM | POA: Insufficient documentation

## 2014-05-31 DIAGNOSIS — I509 Heart failure, unspecified: Secondary | ICD-10-CM | POA: Insufficient documentation

## 2014-05-31 DIAGNOSIS — Z79899 Other long term (current) drug therapy: Secondary | ICD-10-CM | POA: Insufficient documentation

## 2014-05-31 DIAGNOSIS — E119 Type 2 diabetes mellitus without complications: Secondary | ICD-10-CM | POA: Insufficient documentation

## 2014-05-31 DIAGNOSIS — M79671 Pain in right foot: Secondary | ICD-10-CM

## 2014-05-31 HISTORY — DX: Other psychoactive substance abuse, uncomplicated: F19.10

## 2014-05-31 HISTORY — DX: Homelessness unspecified: Z59.00

## 2014-05-31 HISTORY — DX: Cocaine abuse, uncomplicated: F14.10

## 2014-05-31 HISTORY — DX: Homelessness: Z59.0
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
## 2014-05-31 NOTE — ED Provider Notes (Signed)
CSN: [id number]     Arrival date & time 05/31/14  2324 History  This chart was scribed for non-physician practitioner, Francee PiccoloJennifer Concha Sudol, PA-C working with Juliet RudeNathan R. Rubin PayorPickering, MD by Greggory StallionKayla Andersen, ED scribe. This patient was seen in room TR09C/TR09C and the patient's care was started at 11:57 PM.   Chief Complaint  Patient presents with  . Foot Pain   The history is provided by the patient. No language interpreter was used.    HPI Comments: [name] is a 53 y.o. male with history of diabetes who presents to the Emergency Department complaining of bilateral foot pain that started today. He states that he has been "running dope" all day also causing pain to his feet. States a black snake bit his left foot earlier today. Pt is requesting to soak his feet and get some tylenol. He is requesting resources to get off the streets. His sugars run 90-100. Denies chest pain, emesis.   Past Medical History  Diagnosis Date  . Diabetes mellitus without complication   . Hypertension   . Schizophrenia   . CHF (congestive heart failure)   . Neuropathy   . Polysubstance abuse   . Cocaine abuse   . Homelessness    History reviewed. No pertinent past surgical history. Family History  Problem Relation Age of Onset  . Hypertension Other   . Diabetes Other    History  Substance Use Topics  . Smoking status: Current Every Day Smoker -- 1.00 packs/day for 20 years    Types: Cigarettes  . Smokeless tobacco: Current User  . Alcohol Use: Yes     Comment: 40oz on 04/01/14    Review of Systems  Cardiovascular: Negative for chest pain.  Gastrointestinal: Negative for vomiting.  Musculoskeletal: Positive for arthralgias.  All other systems reviewed and are negative.  Allergies  Haldol  Home Medications   Prior to Admission medications   Medication Sig Start Date End Date Taking? Authorizing Provider  acetaminophen (TYLENOL) 500 MG tablet Take 1 tablet (500 mg total) by mouth every 6  (six) hours as needed. 06/01/14   Janos Shampine L Lavance Beazer, PA-C  ARIPiprazole (ABILIFY) 5 MG tablet Take 1 tablet (5 mg total) by mouth 2 (two) times daily. 05/16/14   Velna HatchetSheila May Agustin, NP  benztropine (COGENTIN) 2 MG tablet Take 1 tablet (2 mg total) by mouth 2 (two) times daily. 05/16/14   Velna HatchetSheila May Agustin, NP  cloNIDine (CATAPRES) 0.2 MG tablet Take 0.2 mg by mouth 2 (two) times daily.    Historical Provider, MD  fluPHENAZine decanoate (PROLIXIN) 25 MG/ML injection Inject 25 mg into the muscle every 14 (fourteen) days.    Historical Provider, MD  hydrochlorothiazide (HYDRODIURIL) 25 MG tablet Take 1 tablet (25 mg total) by mouth daily. 05/16/14   Velna HatchetSheila May Agustin, NP  zolpidem (AMBIEN) 5 MG tablet Take 1 tablet (5 mg total) by mouth at bedtime as needed for sleep. 05/16/14   Velna HatchetSheila May Agustin, NP   BP 154/84 mmHg  Pulse 67  Temp(Src) 98.3 F (36.8 C) (Oral)  Resp 16  SpO2 97%   Physical Exam  Constitutional: He is oriented to person, place, and time. He appears well-developed and well-nourished. No distress.  HENT:  Head: Normocephalic and atraumatic.  Right Ear: External ear normal.  Left Ear: External ear normal.  Nose: Nose normal.  Mouth/Throat: Oropharynx is clear and moist.  Eyes: Conjunctivae are normal.  Neck: Normal range of motion. Neck supple.  Cardiovascular: Normal rate, regular rhythm, normal  heart sounds and intact distal pulses.   Pulmonary/Chest: Effort normal and breath sounds normal. No respiratory distress.  Abdominal: Soft.  Musculoskeletal: Normal range of motion.  Neurological: He is alert and oriented to person, place, and time.  Sensation to bilateral feet intact at baseline  Skin: Skin is warm and dry. No rash noted. He is not diaphoretic.  Bilateral palmar surfaces of feet callused. No wounds or ulcers.   Psychiatric: He has a normal mood and affect.  Nursing note and vitals reviewed.   ED Course  Procedures (including critical care  time) Medications  acetaminophen (TYLENOL) tablet 650 mg (650 mg Oral Given 06/01/14 0054)     DIAGNOSTIC STUDIES: Oxygen Saturation is 98% on RA, normal by my interpretation.    COORDINATION OF CARE: 12:00 AM-Discussed treatment plan which includes tylenol and soaking feet with pt at bedside and pt agreed to plan. Patient declines x-ray.   Labs Review Labs Reviewed - No data to display  Imaging Review No results found.   EKG Interpretation None      MDM   Final diagnoses:  Bilateral foot pain    Filed Vitals:   06/01/14 0057  BP: 154/84  Pulse: 67  Temp: 98.3 F (36.8 C)  Resp: 16   Afebrile, NAD, non-toxic appearing, AAOx4.  Neurovascularly intact. Normal sensation per baseline. No evidence of compartment syndrome.  Requesting symptomatic measures. No obvious bony deformity or abnormality noted. Will treat conservatively. Advised podiatry follow-up. Return precautions were discussed. Patient is stable at time of discharge.   I personally performed the services described in this documentation, which was scribed in my presence. The recorded information has been reviewed and is accurate.  Jeannetta EllisJennifer L Adylynn Hertenstein, PA-C 06/01/14 0139  Juliet RudeNathan R. Rubin PayorPickering, MD 06/02/14 0430

## 2014-06-01 MED ORDER — ACETAMINOPHEN 325 MG PO TABS
650.0000 mg | ORAL_TABLET | Freq: Once | ORAL | Status: AC
  Administered 2014-06-01: 650 mg via ORAL
  Filled 2014-06-01: qty 2

## 2014-06-01 MED ORDER — ACETAMINOPHEN 500 MG PO TABS: 500.0000 mg | ORAL_TABLET | Freq: Four times a day (QID) | ORAL | Status: DC | PRN

## 2014-06-01 NOTE — Discharge Instructions (Signed)
Please follow up with your primary care physician in 1-2 days. If you do not have one please call the [facility] number listed above. Please take Tylenol as prescribed. Please read all discharge instructions and return precautions.   Diabetic Neuropathy Diabetic neuropathy is a nerve disease or nerve damage that is caused by diabetes mellitus. About half of all people with diabetes mellitus have some form of nerve damage. Nerve damage is more common in those who have had diabetes mellitus for many years and who generally have not had good control of their blood sugar (glucose) level. Diabetic neuropathy is a common complication of diabetes mellitus. There are three more common types of diabetic neuropathy and a fourth type that is less common and less understood:   Peripheral neuropathy--This is the most common type of diabetic neuropathy. It causes damage to the nerves of the feet and legs first and then eventually the hands and arms.The damage affects the ability to sense touch.  Autonomic neuropathy--This type causes damage to the autonomic nervous system, which controls the following functions:  Heartbeat.  Body temperature.  Blood pressure.  Urination.  Digestion.  Sweating.  Sexual function.  Focal neuropathy--Focal neuropathy can be painful and unpredictable and occurs most often in older adults with diabetes mellitus. It involves a specific nerve or one area and often comes on suddenly. It usually does not cause long-term problems.  Radiculoplexus neuropathy-- Sometimes called lumbosacral radiculoplexus neuropathy, radiculoplexus neuropathy affects the nerves of the thighs, hips, buttocks, or legs. It is more common in people with type 2 diabetes mellitus and in older men. It is characterized by debilitating pain, weakness, and atrophy, usually in the thigh muscles. CAUSES  The cause of peripheral, autonomic, and focal neuropathies is diabetes mellitus that is  uncontrolled and high glucose levels. The cause of radiculoplexus neuropathy is unknown. However, it is thought to be caused by inflammation related to uncontrolled glucose levels. SIGNS AND SYMPTOMS  Peripheral Neuropathy Peripheral neuropathy develops slowly over time. When the nerves of the feet and legs no longer work there may be:   Burning, stabbing, or aching pain in the legs or feet.  Inability to feel pressure or pain in your feet. This can lead to:  Thick calluses over pressure areas.  Pressure sores.  Ulcers.  Foot deformities.  Reduced ability to feel temperature changes.  Muscle weakness. Autonomic Neuropathy The symptoms of autonomic neuropathy vary depending on which nerves are affected. Symptoms may include:  Problems with digestion, such as:  Feeling sick to your stomach (nausea).  Vomiting.  Bloating.  Constipation.  Diarrhea.  Abdominal pain.  Difficulty with urination. This occurs if you lose your ability to sense when your bladder is full. Problems include:  Urine leakage (incontinence).  Inability to empty your bladder completely (retention).  Rapid or irregular heartbeat (palpitations).  Blood pressure drops when you stand up (orthostatic hypotension). When you stand up you may feel:  Dizzy.  Weak.  Faint.  In men, inability to attain and maintain an erection.  In women, vaginal dryness and problems with decreased sexual desire and arousal.  Problems with body temperature regulation.  Increased or decreased sweating. Focal Neuropathy  Abnormal eye movements or abnormal alignment of both eyes.  Weakness in the wrist.  Foot drop. This results in an inability to lift the foot properly and abnormal walking or foot movement.  Paralysis on one side of your face (Bell palsy).  Chest or abdominal pain. Radiculoplexus Neuropathy  Sudden,  severe pain in your hip, thigh, or buttocks.  Weakness and wasting of thigh  muscles.  Difficulty rising from a seated position.  Abdominal swelling.  Unexplained weight loss (usually more than 10 lb [4.5 kg]). DIAGNOSIS  Peripheral Neuropathy Your senses may be tested. Sensory function testing can be done with:  A light touch using a monofilament.  A vibration with tuning fork.  A sharp sensation with a pin prick. Other tests that can help diagnose neuropathy are:  Nerve conduction velocity. This test checks the transmission of an electrical current through a nerve.  Electromyography. This shows how muscles respond to electrical signals transmitted by nearby nerves.  Quantitative sensory testing. This is used to assess how your nerves respond to vibrations and changes in temperature. Autonomic Neuropathy Diagnosis is often based on reported symptoms. Tell your health care provider if you experience:   Dizziness.   Constipation.   Diarrhea.   Inappropriate urination or inability to urinate.   Inability to get or maintain an erection.  Tests that may be done include:   Electrocardiography or Holter monitor. These are tests that can help show problems with the heart rate or heart rhythm.   An X-ray exam may be done. Focal Neuropathy Diagnosis is made based on your symptoms and what your health care provider finds during your exam. Other tests may be done. They may include:  Nerve conduction velocities. This checks the transmission of electrical current through a nerve.  Electromyography. This shows how muscles respond to electrical signals transmitted by nearby nerves.  Quantitative sensory testing. This test is used to assess how your nerves respond to vibration and changes in temperature. Radiculoplexus Neuropathy  Often the first thing is to eliminate any other issue or problems that might be the cause, as there is no stick test for diagnosis.  X-ray exam of your spine and lumbar region.  Spinal tap to rule out cancer.  MRI to  rule out other lesions. TREATMENT  Once nerve damage occurs, it cannot be reversed. The goal of treatment is to keep the disease or nerve damage from getting worse and affecting more nerve fibers. Controlling your blood glucose level is the key. Most people with radiculoplexus neuropathy see at least a partial improvement over time. You will need to keep your blood glucose and HbA1c levels in the target range determined by your health care provider. Things that help control blood glucose levels include:   Blood glucose monitoring.   Meal planning.   Physical activity.   Diabetes medicine.  Over time, maintaining lower blood glucose levels helps lessen symptoms. Sometimes, prescription pain medicine is needed. HOME CARE INSTRUCTIONS:  Do not smoke.  Keep your blood glucose level in the range that you and your health care provider have determined acceptable for you.  Keep your blood pressure level in the range that you and your health care provider have determined acceptable for you.  Eat a well-balanced diet.  Be active every day.  Check your feet every day. SEEK MEDICAL CARE IF:   You have burning, stabbing, or aching pain in the legs or feet.  You are unable to feel pressure or pain in your feet.  You develop problems with digestion such as:  Nausea.  Vomiting.  Bloating.  Constipation.  Diarrhea.  Abdominal pain.  You have difficulty with urination, such as:  Incontinence.  Retention.  You have palpitations.  You develop orthostatic hypotension. When you stand up you may feel:  Dizzy.  Weak.  Faint.  You cannot attain and maintain an erection (in men).  You have vaginal dryness and problems with decreased sexual desire and arousal (in women).  You have severe pain in your thighs, legs, or buttocks.  You have unexplained weight loss. Document Released: 08/26/2001 Document Revised: 04/07/2013 Document Reviewed: 11/26/2012 [facility] Patient  Information 2015 [place] [place]. This information is not intended to replace advice given to you by your health care provider. Make sure you discuss any questions you have with your health care provider.

## 2014-06-04 ENCOUNTER — Emergency Department (HOSPITAL_COMMUNITY)
Admission: EM | Admit: 2014-06-04 | Discharge: 2014-06-04 | Disposition: A | Discharge status: Home / Self Care | Payer: Medicare Other | Attending: Emergency Medicine | Admitting: Emergency Medicine

## 2014-06-04 ENCOUNTER — Encounter (HOSPITAL_COMMUNITY): Payer: Self-pay | Admitting: Emergency Medicine

## 2014-06-04 DIAGNOSIS — F209 Schizophrenia, unspecified: Secondary | ICD-10-CM | POA: Insufficient documentation

## 2014-06-04 DIAGNOSIS — F1994 Other psychoactive substance use, unspecified with psychoactive substance-induced mood disorder: Secondary | ICD-10-CM

## 2014-06-04 DIAGNOSIS — I509 Heart failure, unspecified: Secondary | ICD-10-CM | POA: Insufficient documentation

## 2014-06-04 DIAGNOSIS — I10 Essential (primary) hypertension: Secondary | ICD-10-CM | POA: Insufficient documentation

## 2014-06-04 DIAGNOSIS — Z79899 Other long term (current) drug therapy: Secondary | ICD-10-CM | POA: Insufficient documentation

## 2014-06-04 DIAGNOSIS — R45851 Suicidal ideations: Secondary | ICD-10-CM

## 2014-06-04 DIAGNOSIS — R440 Auditory hallucinations: Secondary | ICD-10-CM

## 2014-06-04 DIAGNOSIS — Z720 Tobacco use: Secondary | ICD-10-CM | POA: Insufficient documentation

## 2014-06-04 DIAGNOSIS — F1410 Cocaine abuse, uncomplicated: Secondary | ICD-10-CM | POA: Insufficient documentation

## 2014-06-04 DIAGNOSIS — E119 Type 2 diabetes mellitus without complications: Secondary | ICD-10-CM | POA: Insufficient documentation

## 2014-06-04 DIAGNOSIS — F329 Major depressive disorder, single episode, unspecified: Secondary | ICD-10-CM | POA: Insufficient documentation

## 2014-06-04 DIAGNOSIS — Z8669 Personal history of other diseases of the nervous system and sense organs: Secondary | ICD-10-CM | POA: Insufficient documentation

## 2014-06-04 DIAGNOSIS — F32A Depression, unspecified: Secondary | ICD-10-CM

## 2014-06-04 DIAGNOSIS — F419 Anxiety disorder, unspecified: Secondary | ICD-10-CM | POA: Insufficient documentation

## 2014-06-04 LAB — RAPID URINE DRUG SCREEN, HOSP PERFORMED
Amphetamines: NOT DETECTED
BARBITURATES: NOT DETECTED
Benzodiazepines: NOT DETECTED
COCAINE: POSITIVE — AB
Opiates: NOT DETECTED
TETRAHYDROCANNABINOL: NOT DETECTED

## 2014-06-04 LAB — CBC
HCT: 39.4 % (ref 39.0–52.0)
Hemoglobin: 12.5 g/dL — ABNORMAL LOW (ref 13.0–17.0)
MCH: 29.3 pg (ref 26.0–34.0)
MCHC: 31.7 g/dL (ref 30.0–36.0)
MCV: 92.3 fL (ref 78.0–100.0)
PLATELETS: 223 10*3/uL (ref 150–400)
RBC: 4.27 MIL/uL (ref 4.22–5.81)
RDW: 12.9 % (ref 11.5–15.5)
WBC: 5 10*3/uL (ref 4.0–10.5)

## 2014-06-04 LAB — COMPREHENSIVE METABOLIC PANEL
ALBUMIN: 4.2 g/dL (ref 3.5–5.2)
ALK PHOS: 88 U/L (ref 39–117)
ALT: 7 U/L (ref 0–53)
AST: 18 U/L (ref 0–37)
Anion gap: 15 (ref 5–15)
BILIRUBIN TOTAL: 0.4 mg/dL (ref 0.3–1.2)
BUN: 15 mg/dL (ref 6–23)
CHLORIDE: 104 meq/L (ref 96–112)
CO2: 23 mEq/L (ref 19–32)
Calcium: 9.8 mg/dL (ref 8.4–10.5)
Creatinine, Ser: 0.77 mg/dL (ref 0.50–1.35)
GFR calc Af Amer: >90 mL/min (ref >90)
GFR calc non Af Amer: >90 mL/min (ref >90)
Glucose, Bld: 93 mg/dL (ref 70–99)
POTASSIUM: 3.9 meq/L (ref 3.7–5.3)
Sodium: 142 mEq/L (ref 137–147)
Total Protein: 7.7 g/dL (ref 6.0–8.3)

## 2014-06-04 LAB — ACETAMINOPHEN LEVEL

## 2014-06-04 LAB — SALICYLATE LEVEL: Salicylate Lvl: <2 mg/dL — ABNORMAL LOW (ref 2.8–20.0)

## 2014-06-04 LAB — ETHANOL

## 2014-06-04 MED ORDER — CLONIDINE HCL 0.1 MG PO TABS
0.2000 mg | ORAL_TABLET | Freq: Two times a day (BID) | ORAL | Status: DC
  Administered 2014-06-04: 0.2 mg via ORAL

## 2014-06-04 MED ORDER — ONDANSETRON HCL 4 MG PO TABS: 4.0000 mg | ORAL_TABLET | Freq: Three times a day (TID) | ORAL | Status: DC | PRN

## 2014-06-04 MED ORDER — ACETAMINOPHEN 325 MG PO TABS
650.0000 mg | ORAL_TABLET | ORAL | Status: DC | PRN
  Administered 2014-06-04 (×2): 650 mg via ORAL

## 2014-06-04 MED ORDER — IBUPROFEN 200 MG PO TABS: 600.0000 mg | ORAL_TABLET | Freq: Three times a day (TID) | ORAL | Status: DC | PRN

## 2014-06-04 MED ORDER — NICOTINE 21 MG/24HR TD PT24
21.0000 mg | MEDICATED_PATCH | Freq: Every day | TRANSDERMAL | Status: DC
  Administered 2014-06-04: 21 mg via TRANSDERMAL

## 2014-06-04 MED ORDER — BENZTROPINE MESYLATE 1 MG PO TABS
2.0000 mg | ORAL_TABLET | Freq: Two times a day (BID) | ORAL | Status: DC
  Administered 2014-06-04: 2 mg via ORAL

## 2014-06-04 MED ORDER — ZOLPIDEM TARTRATE 10 MG PO TABS: 10.0000 mg | ORAL_TABLET | Freq: Every evening | ORAL | Status: DC | PRN

## 2014-06-04 MED ORDER — ARIPIPRAZOLE 5 MG PO TABS: 5.0000 mg | ORAL_TABLET | Freq: Two times a day (BID) | ORAL | Status: DC

## 2014-06-04 MED ORDER — HYDROCHLOROTHIAZIDE 25 MG PO TABS
25.0000 mg | ORAL_TABLET | Freq: Every day | ORAL | Status: DC
  Administered 2014-06-04: 25 mg via ORAL
  Filled 2014-06-04: qty 1

## 2014-06-04 MED ORDER — BENZTROPINE MESYLATE 2 MG PO TABS: 2.0000 mg | ORAL_TABLET | Freq: Two times a day (BID) | ORAL | Status: DC

## 2014-06-04 MED ORDER — ARIPIPRAZOLE 5 MG PO TABS
5.0000 mg | ORAL_TABLET | Freq: Two times a day (BID) | ORAL | Status: DC
  Administered 2014-06-04: 5 mg via ORAL
  Filled 2014-06-04 (×2): qty 1

## 2014-06-04 MED ORDER — FLUPHENAZINE DECANOATE 25 MG/ML IJ SOLN: 25.0000 mg | INTRAMUSCULAR | Status: DC

## 2014-06-04 MED ORDER — LORAZEPAM 1 MG PO TABS: 1.0000 mg | ORAL_TABLET | Freq: Three times a day (TID) | ORAL | Status: DC | PRN

## 2014-06-04 MED ORDER — ALUM & MAG HYDROXIDE-SIMETH 200-200-20 MG/5ML PO SUSP
30.0000 mL | ORAL | Status: DC | PRN
Start: 2014-06-04 — End: 2014-06-04

## 2014-06-04 MED ORDER — HYDROCHLOROTHIAZIDE 25 MG PO TABS: 25.0000 mg | ORAL_TABLET | Freq: Every day | ORAL | Status: DC
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
## 2014-06-04 NOTE — ED Notes (Signed)
Dr Ross into see 

## 2014-06-04 NOTE — ED Notes (Signed)
Up to the bathroom,snack given

## 2014-06-04 NOTE — BH Assessment (Signed)
Assessment completed.  Spoke with Verne SpurrNeil Mashburn, PA-C: Recommendation was [facility], have him re-assessed by psychiatry in the morning and possibly transfer him to the Obs U later.  Spoke to FijiLatrisha: Advised of plan  Spoke to Dr. Lynelle DoctorKnapp: Advised of plan  Beryle FlockMary Shuntia Exton, MS, [facility], [facility] Triage Specialist [facility]
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
## 2014-06-04 NOTE — BH Assessment (Signed)
Tele Assessment Note   Taylor DouglasFredrick L Mcphearson is an 53 y.o. male. Pt presented to ED accompanied by the [employer].  Pt reported that he was having SI with plan, intent and means.  Pt also reported and stated to this interviewer his anger toward the police and "these people on this ward who are baby killers."  Pt then began getting agitated and more angry while yelling loudly threats toward the police.  Pt calmed quickly once the subject was changed by the interviewer. Pt reported a hx of schizophrenia, medication non-compliance and drug use,, specifically cocaine.  Pt denied any history in his family of SA or MH issues. Pt reported a history of incarceration.  Stressors for this pt include homelessness, conflict with his family and the police and aging with chronic health problems.  Supports include 1 cousin who the pt says is still supportive. Pt denies a hx of any trauma (physical, emotional or sexual).   Pt appeared disheveled in hospital scrubs.  Pt was fidgety and displayed pressured speech.  Pt had tangential thought process but could be easily redirected back to the subject at hand. Pt was cooperative, even when angry. When not angry he was pleasant and overly familiar. Pt's judgement and insight were impaired and poor.  Axis I:Schizophrenia by hx; Unspecified Stimulant- Related Disorder Axis II: Deferred Axis III: Diabetes, Hypertension, Neuropathy, CHF Axis IV: economic problems, housing problems, other psychosocial or environmental problems, problems related to legal system/crime, problems related to social environment and problems with primary support group Axis V: 1-10 persistent dangerousness to self and others present  Past Medical History:  Past Medical History  Diagnosis Date  . Diabetes mellitus without complication   . Hypertension   . Schizophrenia   . CHF (congestive heart failure)   . Neuropathy   . Polysubstance abuse   . Cocaine abuse   . Homelessness     History reviewed. No  pertinent past surgical history.  Family History:  Family History  Problem Relation Age of Onset  . Hypertension Other   . Diabetes Other     Social History:  reports that he has been smoking Cigarettes.  He has a 20 pack-year smoking history. He uses smokeless tobacco. He reports that he drinks alcohol. He reports that he uses illicit drugs ("Crack" cocaine, Cocaine, and Marijuana) about 7 times per week.  Additional Social History:  Alcohol / Drug Use Prescriptions: See PTA list History of alcohol / drug use?: Yes Longest period of sobriety (when/how long): 10 mos Negative Consequences of Use: Financial, Legal, Personal relationships Substance #1 Name of Substance 1: Cocaine/Crack 1 - Age of First Use: 23 1 - Amount (size/oz): $200/day worth 1 - Frequency: Daily 1 - Duration: since 1986 1 - Last Use / Amount: Today Substance #2 Name of Substance 2: Tobacco 2 - Age of First Use: 8 2 - Amount (size/oz): 1 pack 2 - Frequency: Daily 2 - Duration: since 53 yo 2 - Last Use / Amount: Today  CIWA: CIWA-Ar BP: 181/95 mmHg Pulse Rate: 67 COWS:    PATIENT STRENGTHS: (choose at least two) Motivation for treatment/growth  Allergies:  Allergies  Allergen Reactions  . Haldol [Haloperidol] Other (See Comments)    Muscle spasms, loss of voluntary movement. However, pt has taken Thorazine on multiple occasions with no adverse effects.     Home Medications:  (Not in a hospital admission)  OB/GYN Status:  No LMP for male patient.  General Assessment Data Location of Assessment: [facility] ED  Is this a Tele or Face-to-Face Assessment?: Tele Assessment Is this an Initial Assessment or a Re-assessment for this encounter?: Initial Assessment Living Arrangements: Alone (Homeless) Can pt return to current living arrangement?: Yes Admission Status: Voluntary Is patient capable of signing voluntary admission?: Yes Transfer from: Other (Comment) Scientist, forensic) Referral Source:  Self/Family/Friend  Medical Screening Exam [facility] Walk-in ONLY) Medical Exam completed: Yes Reason for MSE not completed:  (Completed)  [facility] Crisis Care Plan Living Arrangements: Alone (Homeless) Name of Psychiatrist: Transport planner Name of Therapist: [name]  Education Status Is patient currently in school?: No Current Grade: na Highest grade of school patient has completed: 12 Name of school: na Contact person: na  Risk to self with the past 6 months Suicidal Ideation: Yes-Currently Present Suicidal Intent: Yes-Currently Present Is patient at risk for suicide?: Yes Suicidal Plan?: Yes-Currently Present Specify Current Suicidal Plan: Run into traffic Access to Means: Yes Specify Access to Suicidal Means: Traffic What has been your use of drugs/alcohol within the last 12 months?: Daily use Previous Attempts/Gestures: Yes How many times?: 4 Other Self Harm Risks: no Triggers for Past Attempts: Other personal contacts, Hallucinations Intentional Self Injurious Behavior: None Comment - Self Injurious Behavior: na Family Suicide History: Unknown Recent stressful life event(s): Conflict (Comment) (Pt says he is tired of conflict related to drug use) Persecutory voices/beliefs?: Yes (Police are after him) Depression: No Substance abuse history and/or treatment for substance abuse?: Yes Suicide prevention information given to non-admitted patients: Not applicable  Risk to Others within the past 6 months Homicidal Ideation: Yes-Currently Present Thoughts of Harm to Others: Yes-Currently Present Comment - Thoughts of Harm to Others: Stated he wants to kill all the police and people on his ward Current Homicidal Intent: Yes-Currently Present Current Homicidal Plan: Yes-Currently Present Describe Current Homicidal Plan: Says he wants to blow everyone up and "I can get a gun and I was in the [employer]" Access to Homicidal Means: Yes Describe Access to Homicidal Means: gun Identified Victim:  police History of harm to others?: No (denies) Assessment of Violence: None Noted Violent Behavior Description: none noted Does patient have access to weapons?: Yes (Comment) (says he does) Criminal Charges Pending?: Yes Describe Pending Criminal Charges: tresspassing and resisting arrest Does patient have a court date: Yes Court Date: 07/01/14  Psychosis Hallucinations: None noted (Denies at present time) Delusions: Persecutory  Mental Status Report Appear/Hygiene: Disheveled, In scrubs Eye Contact: Good Motor Activity: Freedom of movement, Restlessness Speech: Pressured Level of Consciousness: Alert Mood: Angry, Labile, Pleasant Affect: Appropriate to circumstance Anxiety Level: Moderate Thought Processes: Tangential Judgement: Impaired Orientation: Person, Place Obsessive Compulsive Thoughts/Behaviors: Unable to Assess  Cognitive Functioning Concentration: Decreased Memory: Unable to Assess IQ: Average Insight: Poor Impulse Control: Unable to Assess Appetite: Good Weight Loss: 0 Weight Gain: 0 Sleep: No Change Total Hours of Sleep: 3 Vegetative Symptoms: Unable to Assess  ADLScreening [facility]) Patient's cognitive ability adequate to safely complete daily activities?: Yes Patient able to express need for assistance with ADLs?: Yes Independently performs ADLs?: Yes (appropriate for developmental age)  Prior Inpatient Therapy Prior Inpatient Therapy: Yes Prior Therapy Dates: 2001 - 2015 Prior Therapy Facilty/Provider(s): [facility] is only facility he could remember to name Reason for Treatment: SI, Schizphrenia  Prior Outpatient Therapy Prior Outpatient Therapy: Yes Prior Therapy Dates: current Prior Therapy Facilty/Provider(s): [name] Reason for Treatment: Medication and oher basic svs  ADL Screening (condition at time of admission) Patient's cognitive ability adequate to safely complete daily activities?: Yes Patient able to express  need for  assistance with ADLs?: Yes Independently performs ADLs?: Yes (appropriate for developmental age)       Abuse/Neglect Assessment (Assessment to be complete while patient is alone) Physical Abuse: Denies Verbal Abuse: Denies Sexual Abuse: Denies Exploitation of patient/patient's resources: Denies Self-Neglect: Denies Values / Beliefs Cultural Requests During Hospitalization: None Spiritual Requests During Hospitalization: None   Advance Directives (For Healthcare) Does patient have an advance directive?: No Would patient like information on creating an advanced directive?: No - patient declined information    Additional Information 1:1 In Past 12 Months?: No CIRT Risk: Yes (pt very agitated periodically) Elopement Risk: No Does patient have medical clearance?: Yes    Disposition Initial Assessment Completed for this Encounter: Yes Disposition of Patient: Referred to (stay in ED for re-evaluation by psychiatry; transfer to Obs) Type of inpatient treatment program: Adult Other disposition(s): Other (Comment) (later transfer to Obs) Patient referred to:  [facility])    Spoke with Verne SpurrNeil Mashburn, PA-C: [facility], have him re-assessed by psychiatry in the morning and possibly transfer him to the Obs U later.  Spoke to FijiLatrisha: Advised of plan  Spoke to Dr. Lynelle DoctorKnapp: Advised of plan   Beryle FlockMary Nada Godley, MS, [facility], [facility] Triage Specialist [facility]  06/04/2014 6:54 AM

## 2014-06-04 NOTE — [facility] Suicide Risk Assessment (Signed)
Suicide Risk Assessment  Discharge Assessment     Demographic Factors:  Male  Total Time spent with patient: 25 minutes   Psychiatric Specialty Exam:     BP 152/77 mmHg  Pulse 69  Temp(Src) 98.8 F (37.1 C) (Oral)  Resp 16  SpO2 100%  General Appearance: Casual  Eye Contact:: Good  Speech: Clear and Coherent and Normal Rate  Volume: Normal  Mood: Anxious  Affect: Congruent  Thought Process: Circumstantial and Goal Directed  Orientation: Full (Time, Place, and Person)  Thought Content: Rumination  Suicidal Thoughts: No  Homicidal Thoughts: No  Memory: Immediate; Good Recent; Good Remote; Good  Judgement: Intact  Insight: Fair  Psychomotor Activity: Normal  Concentration: Fair  Recall: Good  Fund of Knowledge:Good  Language: Good  Akathisia: No  Handed: Right  AIMS (if indicated):    Assets: Communication Skills Desire for Improvement Social Support  Sleep:     Musculoskeletal:  Strength & Muscle Tone: within normal limits Gait & Station: normal Patient leans: N/A  Mental Status Per Nursing Assessment::   On Admission:     Current Mental Status by Physician: denies suicidal/homicidal ideation, psychosis, and paranoia  Loss Factors: NA  Historical Factors: NA  Risk Reduction Factors:   Positive social support  Continued Clinical Symptoms:  Alcohol/Substance Abuse/Dependencies Previous Psychiatric Diagnoses and Treatments  Cognitive Features That Contribute To Risk:  Closed-mindedness    Suicide Risk:  Minimal: No identifiable suicidal ideation.  Patients presenting with no risk factors but with morbid ruminations; may be classified as minimal risk based on the severity of the depressive symptoms    Discharge Diagnoses:  AXIS I: Substance Abuse and Substance Induced Mood Disorder AXIS II: Deferred AXIS III:  Past Medical History  Diagnosis Date  . Diabetes mellitus without  complication   . Hypertension   . Schizophrenia   . CHF (congestive heart failure)   . Neuropathy    AXIS IV: other psychosocial or environmental problems and problems related to social environment  AXIS V: 61-70 mild symptoms    Plan Of Care/Follow-up recommendations:  Activity:  as tolerated Diet:  as tolerated   Is patient on multiple antipsychotic therapies at discharge:  No   Has Patient had three or more failed trials of antipsychotic monotherapy by history:  No   Recommended Plan for Multiple Antipsychotic Therapies: NA   Beau FannyWithrow, Shakya Sebring C, FNP-BC 06/04/2014, 6:36 PM

## 2014-06-04 NOTE — Consult Note (Signed)
[facility] Face-to-Face Psychiatry Consult   Reason for Consult:  Substance abuse Referring Physician:  EDP [name] is an 53 y.o. male. Total Time spent with patient: 25 minutes  Assessment: AXIS I:  Substance Abuse and Substance Induced Mood Disorder AXIS II:  Deferred AXIS III:   Past Medical History  Diagnosis Date  . Diabetes mellitus without complication   . Hypertension   . Schizophrenia   . CHF (congestive heart failure)   . Neuropathy   . Polysubstance abuse   . Cocaine abuse   . Homelessness    AXIS IV:  other psychosocial or environmental problems and problems related to social environment AXIS V:  51-60; moderate symptoms   Plan:  No evidence of imminent risk to self or others at present.   Patient does not meet criteria for psychiatric inpatient admission. Supportive therapy provided about ongoing stressors. Discussed crisis plan, support from social network, calling 911, coming to the Emergency Department, and calling Suicide Hotline.  Subjective:   [name] is a 53 y.o. male patient admitted to the [facility] for monitoring. Pt denies SI, HI, and AVH, is at baseline, and may discharge home. Pt is well-known to this NP for 10 years and was recently discharged from my care in the [facility] OBS Unit. He is well-known for manipulative behaviors, malingering, and situational suicidal/homicidal ideation which resolves with food and shelter.   HPI:  [name] is an 53 y.o. male. Pt presented to ED accompanied by the [employer]. Pt reported that he was having SI with plan, intent and means. Pt also reported and stated to this interviewer his anger toward the police and "these people on this ward who are baby killers." Pt then began getting agitated and more angry while yelling loudly threats toward the police. Pt calmed quickly once the subject was changed by the interviewer. Pt reported a hx of schizophrenia, medication non-compliance and drug use,, specifically  cocaine. Pt denied any history in his family of SA or MH issues. Pt reported a history of incarceration. Stressors for this pt include homelessness, conflict with his family and the police and aging with chronic health problems. Supports include 1 cousin who the pt says is still supportive. Pt denies a hx of any trauma (physical, emotional or sexual).   A few days ago at [facility]:  Patient wants to stay in the ED or a psychiatric hospital until Dec 3 when he gets his check to pay off the drug dealers that are after him for their money he owes them.    HPI Elements:   Location:  substance abuse. Quality:  substance induced mood disorder. Severity:  substance iduced mood disorder. Timing:  1 day.    Review of Systems  Psychiatric/Behavioral: Positive for substance abuse. Negative for depression, suicidal ideas, hallucinations and memory loss. The patient is not nervous/anxious and does not have insomnia.   All other systems reviewed and are negative.  Family History  Problem Relation Age of Onset  . Hypertension Other   . Diabetes Other      Past Psychiatric History: Past Medical History  Diagnosis Date  . Diabetes mellitus without complication   . Hypertension   . Schizophrenia   . CHF (congestive heart failure)   . Neuropathy   . Polysubstance abuse   . Cocaine abuse   . Homelessness     reports that he has been smoking Cigarettes.  He has a 20 pack-year smoking history. He uses smokeless tobacco. He reports  that he drinks alcohol. He reports that he uses illicit drugs ("Crack" cocaine, Cocaine, and Marijuana) about 7 times per week. Family History  Problem Relation Age of Onset  . Hypertension Other   . Diabetes Other    Family History Substance Abuse: Yes, Describe: Family Supports: Yes, List: (1 cousin) Living Arrangements: Alone (Homeless) Can pt return to current living arrangement?: Yes Abuse/Neglect [facility]) Physical Abuse: Denies Verbal Abuse: Denies Sexual Abuse:  Denies Allergies:   Allergies  Allergen Reactions  . Haldol [Haloperidol] Other (See Comments)    Muscle spasms, loss of voluntary movement. However, pt has taken Thorazine on multiple occasions with no adverse effects.     ACT Assessment Complete:  Yes:    Educational Status    Risk to Self: Risk to self with the past 6 months Suicidal Ideation: Yes-Currently Present Suicidal Intent: Yes-Currently Present Is patient at risk for suicide?: Yes Suicidal Plan?: Yes-Currently Present Specify Current Suicidal Plan: Run into traffic Access to Means: Yes Specify Access to Suicidal Means: Traffic What has been your use of drugs/alcohol within the last 12 months?: Daily use Previous Attempts/Gestures: Yes How many times?: 4 Other Self Harm Risks: no Triggers for Past Attempts: Other personal contacts, Hallucinations Intentional Self Injurious Behavior: None Comment - Self Injurious Behavior: na Family Suicide History: Unknown Recent stressful life event(s): Conflict (Comment) (Pt says he is tired of conflict related to drug use) Persecutory voices/beliefs?: Yes (Police are after him) Depression: No Substance abuse history and/or treatment for substance abuse?: Yes Suicide prevention information given to non-admitted patients: Not applicable  Risk to Others: Risk to Others within the past 6 months Homicidal Ideation: Yes-Currently Present Thoughts of Harm to Others: Yes-Currently Present Comment - Thoughts of Harm to Others: Stated he wants to kill all the police and people on his ward Current Homicidal Intent: Yes-Currently Present Current Homicidal Plan: Yes-Currently Present Describe Current Homicidal Plan: Says he wants to blow everyone up and "I can get a gun and I was in the [employer]" Access to Homicidal Means: Yes Describe Access to Homicidal Means: gun Identified Victim: police History of harm to others?: No (denies) Assessment of Violence: None Noted Violent Behavior  Description: none noted Does patient have access to weapons?: Yes (Comment) (says he does) Criminal Charges Pending?: Yes Describe Pending Criminal Charges: tresspassing and resisting arrest Does patient have a court date: Yes Court Date: 07/01/14  Abuse: Abuse/Neglect Assessment (Assessment to be complete while patient is alone) Physical Abuse: Denies Verbal Abuse: Denies Sexual Abuse: Denies Exploitation of patient/patient's resources: Denies Self-Neglect: Denies  Prior Inpatient Therapy: Prior Inpatient Therapy Prior Inpatient Therapy: Yes Prior Therapy Dates: 2001 - 2015 Prior Therapy Facilty/Provider(s): [facility] is only facility he could remember to name Reason for Treatment: SI, Schizphrenia  Prior Outpatient Therapy: Prior Outpatient Therapy Prior Outpatient Therapy: Yes Prior Therapy Dates: current Prior Therapy Facilty/Provider(s): [name] Reason for Treatment: Medication and oher basic svs  Additional Information: Additional Information 1:1 In Past 12 Months?: No CIRT Risk: Yes (pt very agitated periodically) Elopement Risk: No Does patient have medical clearance?: Yes                  Objective: Blood pressure 152/77, pulse 69, temperature 98.8 F (37.1 C), temperature source Oral, resp. rate 16, SpO2 100 %.There is no weight on file to calculate BMI. Results for orders placed or performed during the hospital encounter of 06/04/14 (from the past 72 hour(s))  Acetaminophen level     Status: None   Collection  Time: 06/04/14  4:21 AM  Result Value Ref Range   Acetaminophen (Tylenol), Serum <15.0 10 - 30 ug/mL    Comment:        THERAPEUTIC CONCENTRATIONS VARY SIGNIFICANTLY. A RANGE OF 10-30 ug/mL MAY BE AN EFFECTIVE CONCENTRATION FOR MANY PATIENTS. HOWEVER, SOME ARE BEST TREATED AT CONCENTRATIONS OUTSIDE THIS RANGE. ACETAMINOPHEN CONCENTRATIONS >150 ug/mL AT 4 HOURS AFTER INGESTION AND >50 ug/mL AT 12 HOURS AFTER INGESTION ARE OFTEN ASSOCIATED WITH  TOXIC REACTIONS.   CBC     Status: Abnormal   Collection Time: 06/04/14  4:21 AM  Result Value Ref Range   WBC 5.0 4.0 - 10.5 K/uL   RBC 4.27 4.22 - 5.81 MIL/uL   Hemoglobin 12.5 (L) 13.0 - 17.0 g/dL   HCT 39.4 39.0 - 52.0 %   MCV 92.3 78.0 - 100.0 fL   MCH 29.3 26.0 - 34.0 pg   MCHC 31.7 30.0 - 36.0 g/dL   RDW 12.9 11.5 - 15.5 %   Platelets 223 150 - 400 K/uL  Comprehensive metabolic panel     Status: None   Collection Time: 06/04/14  4:21 AM  Result Value Ref Range   Sodium 142 137 - 147 mEq/L   Potassium 3.9 3.7 - 5.3 mEq/L   Chloride 104 96 - 112 mEq/L   CO2 23 19 - 32 mEq/L   Glucose, Bld 93 70 - 99 mg/dL   BUN 15 6 - 23 mg/dL   Creatinine, Ser 0.77 0.50 - 1.35 mg/dL   Calcium 9.8 8.4 - 10.5 mg/dL   Total Protein 7.7 6.0 - 8.3 g/dL   Albumin 4.2 3.5 - 5.2 g/dL   AST 18 0 - 37 U/L   ALT 7 0 - 53 U/L   Alkaline Phosphatase 88 39 - 117 U/L   Total Bilirubin 0.4 0.3 - 1.2 mg/dL   GFR calc non Af Amer >90 >90 mL/min   GFR calc Af Amer >90 >90 mL/min    Comment: (NOTE) The eGFR has been calculated using the CKD EPI equation. This calculation has not been validated in all clinical situations. eGFR's persistently <90 mL/min signify possible Chronic Kidney Disease.    Anion gap 15 5 - 15  Ethanol (ETOH)     Status: None   Collection Time: 06/04/14  4:21 AM  Result Value Ref Range   Alcohol, Ethyl (B) <11 0 - 11 mg/dL    Comment:        LOWEST DETECTABLE LIMIT FOR SERUM ALCOHOL IS 11 mg/dL FOR MEDICAL PURPOSES ONLY   Salicylate level     Status: Abnormal   Collection Time: 06/04/14  4:21 AM  Result Value Ref Range   Salicylate Lvl <7.7 (L) 2.8 - 20.0 mg/dL  Urine Drug Screen     Status: Abnormal   Collection Time: 06/04/14  4:21 AM  Result Value Ref Range   Opiates NONE DETECTED NONE DETECTED   Cocaine POSITIVE (A) NONE DETECTED   Benzodiazepines NONE DETECTED NONE DETECTED   Amphetamines NONE DETECTED NONE DETECTED   Tetrahydrocannabinol NONE DETECTED NONE  DETECTED   Barbiturates NONE DETECTED NONE DETECTED    Comment:        DRUG SCREEN FOR MEDICAL PURPOSES ONLY.  IF CONFIRMATION IS NEEDED FOR ANY PURPOSE, NOTIFY LAB WITHIN 5 DAYS.        LOWEST DETECTABLE LIMITS FOR URINE DRUG SCREEN Drug Class       Cutoff (ng/mL) Amphetamine      1000 Barbiturate  200 Benzodiazepine   973 Tricyclics       532 Opiates          300 Cocaine          300 THC              50    Labs are reviewed see values above.  Medications reviewed and no changes made.  Current Facility-Administered Medications  Medication Dose Route Frequency Provider Last Rate Last Dose  . acetaminophen (TYLENOL) tablet 650 mg  650 mg Oral Q4H PRN Janice Norrie, MD   650 mg at 06/04/14 0953  . alum & mag hydroxide-simeth (MAALOX/MYLANTA) 200-200-20 MG/5ML suspension 30 mL  30 mL Oral PRN Janice Norrie, MD      . ARIPiprazole (ABILIFY) tablet 5 mg  5 mg Oral BID Janice Norrie, MD   5 mg at 06/04/14 9924  . benztropine (COGENTIN) tablet 2 mg  2 mg Oral BID Janice Norrie, MD   2 mg at 06/04/14 0953  . cloNIDine (CATAPRES) tablet 0.2 mg  0.2 mg Oral BID Janice Norrie, MD   0.2 mg at 06/04/14 0953  . hydrochlorothiazide (HYDRODIURIL) tablet 25 mg  25 mg Oral Daily Janice Norrie, MD   25 mg at 06/04/14 0953  . ibuprofen (ADVIL,MOTRIN) tablet 600 mg  600 mg Oral Q8H PRN Janice Norrie, MD      . LORazepam (ATIVAN) tablet 1 mg  1 mg Oral Q8H PRN Janice Norrie, MD      . nicotine (NICODERM CQ - dosed in mg/24 hours) patch 21 mg  21 mg Transdermal Daily Janice Norrie, MD   21 mg at 06/04/14 0953  . ondansetron (ZOFRAN) tablet 4 mg  4 mg Oral Q8H PRN Janice Norrie, MD      . zolpidem (AMBIEN) tablet 10 mg  10 mg Oral QHS PRN Janice Norrie, MD       Current Outpatient Prescriptions  Medication Sig Dispense Refill  . acetaminophen (TYLENOL) 500 MG tablet Take 1 tablet (500 mg total) by mouth every 6 (six) hours as needed. 30 tablet 0  . ARIPiprazole (ABILIFY) 5 MG tablet Take 1 tablet (5 mg total) by mouth  2 (two) times daily. 60 tablet 0  . benztropine (COGENTIN) 2 MG tablet Take 1 tablet (2 mg total) by mouth 2 (two) times daily. 30 tablet 0  . cloNIDine (CATAPRES) 0.2 MG tablet Take 0.2 mg by mouth 2 (two) times daily.    . fluPHENAZine decanoate (PROLIXIN) 25 MG/ML injection Inject 25 mg into the muscle every 14 (fourteen) days.    . hydrochlorothiazide (HYDRODIURIL) 25 MG tablet Take 1 tablet (25 mg total) by mouth daily. 30 tablet 0  . zolpidem (AMBIEN) 5 MG tablet Take 1 tablet (5 mg total) by mouth at bedtime as needed for sleep. 30 tablet 0    Psychiatric Specialty Exam:     Blood pressure 152/77, pulse 69, temperature 98.8 F (37.1 C), temperature source Oral, resp. rate 16, SpO2 100 %.There is no weight on file to calculate BMI.  General Appearance: Casual  Eye Contact::  Good  Speech:  Clear and Coherent and Normal Rate  Volume:  Normal  Mood:  Anxious  Affect:  Congruent  Thought Process:  Circumstantial and Goal Directed  Orientation:  Full (Time, Place, and Person)  Thought Content:  Rumination  Suicidal Thoughts:  No  Homicidal Thoughts:  No  Memory:  Immediate;   Good Recent;  Good Remote;   Good  Judgement:  Intact  Insight:  Fair  Psychomotor Activity:  Normal  Concentration:  Fair  Recall:  Good  Fund of Knowledge:Good  Language: Good  Akathisia:  No  Handed:  Right  AIMS (if indicated):     Assets:  Communication Skills Desire for Improvement Social Support  Sleep:      Musculoskeletal: Strength & Muscle Tone: within normal limits Gait & Station: normal Patient leans: N/A  Treatment Plan Summary: Discharge home Patient to follow up with Herbert Pun, FNP-BC 06/04/2014 6:41 PM  Patient is seen and I agree with treatment and plan Levonne Spiller MD

## 2014-06-04 NOTE — ED Notes (Signed)
Up tot he bathroom to shower and change scrubs 

## 2014-06-04 NOTE — ED Notes (Signed)
Pt has two personal belonging bags of clothes.  Pt has shirt, pants, jacket, fur hat, shoes, socks and a mini lighter.  Pt has a key on a lanyard.

## 2014-06-04 NOTE — ED Notes (Signed)
Pt presents with complaint of SI, plan to run into traffic.  Admits to using cocaine last night, stating he wants help with getting clean.  Pt ranting about this being a baby killer ward and he doesn't want to be in this department.  Security at bedside to speak with pt.  [facility]

## 2014-06-04 NOTE — ED Notes (Signed)
Pt arrived to the ED with a complaint of suicidal ideations after smoking $100 worth of cocaine.  Pt states that he is serious this time about getting rehabilitated off of cocaine.  Pt states he wants to get hit by a car.

## 2014-06-04 NOTE — BH Assessment (Signed)
Received request for Assessment.  Spoke with [name] :  She advised that pt is medically cleared. She said Pt reports SI and hearing voices. She advised that pt is a cocaine abuser and wants LT detox.    Spoke with [name], pt's nurse:  Asked her to place TA equipment in pt's room for assessment. [name] advised that the pt is "very agitated right now."  She reported that he is wanting to leave the hospital.   We agreed that we will wait to do the Assessment to try to get a better, more accurate result.  [name] will call TTS when the pt has calmed down and can be assessed.  [name], MS, CRC, [facility] Triage Specialist [facility]

## 2014-06-13 ENCOUNTER — Emergency Department (HOSPITAL_COMMUNITY)
Admission: EM | Admit: 2014-06-13 | Discharge: 2014-06-14 | Disposition: A | Discharge status: Home / Self Care | Payer: Medicare Other | Attending: Emergency Medicine | Admitting: Emergency Medicine

## 2014-06-13 ENCOUNTER — Encounter (HOSPITAL_COMMUNITY): Payer: Self-pay

## 2014-06-13 DIAGNOSIS — E119 Type 2 diabetes mellitus without complications: Secondary | ICD-10-CM | POA: Insufficient documentation

## 2014-06-13 DIAGNOSIS — Z79899 Other long term (current) drug therapy: Secondary | ICD-10-CM | POA: Insufficient documentation

## 2014-06-13 DIAGNOSIS — Z8669 Personal history of other diseases of the nervous system and sense organs: Secondary | ICD-10-CM | POA: Insufficient documentation

## 2014-06-13 DIAGNOSIS — F209 Schizophrenia, unspecified: Secondary | ICD-10-CM | POA: Insufficient documentation

## 2014-06-13 DIAGNOSIS — I509 Heart failure, unspecified: Secondary | ICD-10-CM | POA: Insufficient documentation

## 2014-06-13 DIAGNOSIS — F1910 Other psychoactive substance abuse, uncomplicated: Secondary | ICD-10-CM

## 2014-06-13 DIAGNOSIS — R4589 Other symptoms and signs involving emotional state: Secondary | ICD-10-CM

## 2014-06-13 DIAGNOSIS — Z720 Tobacco use: Secondary | ICD-10-CM | POA: Insufficient documentation

## 2014-06-13 DIAGNOSIS — R4689 Other symptoms and signs involving appearance and behavior: Secondary | ICD-10-CM

## 2014-06-13 DIAGNOSIS — I10 Essential (primary) hypertension: Secondary | ICD-10-CM | POA: Insufficient documentation

## 2014-06-13 MED ORDER — IBUPROFEN 400 MG PO TABS: 600.0000 mg | ORAL_TABLET | Freq: Three times a day (TID) | ORAL | Status: DC | PRN

## 2014-06-13 MED ORDER — ONDANSETRON HCL 4 MG PO TABS: 4.0000 mg | ORAL_TABLET | Freq: Three times a day (TID) | ORAL | Status: DC | PRN

## 2014-06-13 MED ORDER — ALUM & MAG HYDROXIDE-SIMETH 200-200-20 MG/5ML PO SUSP
30.0000 mL | ORAL | Status: DC | PRN
  Administered 2014-06-13: 30 mL via ORAL
  Filled 2014-06-13: qty 30

## 2014-06-13 MED ORDER — ACETAMINOPHEN 500 MG PO TABS
1000.0000 mg | ORAL_TABLET | Freq: Once | ORAL | Status: AC
  Administered 2014-06-13: 1000 mg via ORAL
  Filled 2014-06-13: qty 2

## 2014-06-13 MED ORDER — ACETAMINOPHEN 325 MG PO TABS
650.0000 mg | ORAL_TABLET | ORAL | Status: DC | PRN
  Administered 2014-06-13 – 2014-06-14 (×4): 650 mg via ORAL
  Filled 2014-06-13 (×4): qty 2

## 2014-06-13 MED ORDER — ZOLPIDEM TARTRATE 5 MG PO TABS: 5.0000 mg | ORAL_TABLET | Freq: Every evening | ORAL | Status: DC | PRN

## 2014-06-13 MED ORDER — LORAZEPAM 1 MG PO TABS
1.0000 mg | ORAL_TABLET | Freq: Three times a day (TID) | ORAL | Status: DC | PRN
  Administered 2014-06-13: 1 mg via ORAL
  Filled 2014-06-13: qty 1

## 2014-06-13 MED ORDER — CEFAZOLIN SODIUM-DEXTROSE 2-3 GM-% IV SOLR
INTRAVENOUS | Status: AC
  Filled 2014-06-13: qty 50

## 2014-06-13 NOTE — Care Management (Addendum)
  CARE MANAGEMENT ED NOTE 06/13/2014  Patient:  [name], [name]   Account Number:  [id number]  Date Initiated:  06/13/2014  Documentation initiated by:  Laurena Slimmer  Subjective/Objective Assessment:     Subjective/Objective Assessment Detail:   53 year old male with a past medical history of diabetes, hypertension, schizophrenia, and polysubstance abuse who presents for suicidal ideations. Patient reports he has been abusing crack cocaine since the 1980's. Patient reports "I'm not living right" and states there is no reason to live. He reports SI without HI. He has no plan. Patient reports also drinking alcohol occasionally.     Action/Plan:   Action/Plan Detail:   Referrall to [place] to establish care.  Resume services with [facility] services once discharged    Anticipated DC Date:       Status Recommendation to Physician:   Result of Recommendation:  Agreed  Other ED Services  Consult Working [place]  CM consult  PCP issues    Choice offered to / List presented to:            Status of service:  Completed, signed off  ED Comments:   ED Comments Detail:  ED CM consulted with patient concerning f/u care.Patient has had 30 ED visits with 1 hospitalization in the past 6 months. Patient presented to [facility] ED for medoication refills, Patient reports he has been abusing crack cocaine since the 1980's. Patient reports "I'm not living right" and states there is no reason to live. He reports SI without HI. He has no plan. TTS wias ordered. Met with patiient at bedside regarding where he receives care. Patient states, he does not have a PCP he is looking for one, as per patient. Offered patient assistance, patient agreeable. Discussed [place] with patient and the services provided at the [facility] including the pharmacy program. Proivided brochure for the Clinic with information patient agreeable to call to schedule appointment for follow upon discharge. Information also  emailed to clinic regarding patient desiring to establish care. Patient also active with [name] receiving [facility] services patient encourage to f/u with outpatient services once medically cleared for discharge.  Patient awaiting inpatient psych. No further CM needs identified.

## 2014-06-13 NOTE — ED Provider Notes (Signed)
CSN: [id number]     Arrival date & time 06/13/14  [id number] History   First MD Initiated Contact with Patient 06/13/14 [phone]     Chief Complaint  Patient presents with  . Medication Refill     (Consider location/radiation/quality/duration/timing/severity/associated sxs/prior Treatment) HPI Comments: Patient is a 53 year old male with a past medical history of diabetes, hypertension, schizophrenia, and polysubstance abuse who presents for suicidal ideations. Patient reports he has been abusing crack cocaine since the 1980's. Patient reports "I'm not living right" and states there is no reason to live. He reports SI without HI. He has no plan. Patient reports also drinking alcohol occasionally.    Past Medical History  Diagnosis Date  . Diabetes mellitus without complication   . Hypertension   . Schizophrenia   . CHF (congestive heart failure)   . Neuropathy   . Polysubstance abuse   . Cocaine abuse   . Homelessness    History reviewed. No pertinent past surgical history. Family History  Problem Relation Age of Onset  . Hypertension Other   . Diabetes Other    History  Substance Use Topics  . Smoking status: Current Every Day Smoker -- 1.00 packs/day for 20 years    Types: Cigarettes  . Smokeless tobacco: Current User  . Alcohol Use: Yes     Comment: 40oz on 04/01/14    Review of Systems  Constitutional: Negative for fever, chills and fatigue.  HENT: Negative for trouble swallowing.   Eyes: Negative for visual disturbance.  Respiratory: Negative for shortness of breath.   Cardiovascular: Negative for chest pain and palpitations.  Gastrointestinal: Negative for nausea, vomiting, abdominal pain and diarrhea.  Genitourinary: Negative for dysuria and difficulty urinating.  Musculoskeletal: Negative for arthralgias and neck pain.  Skin: Negative for color change.  Neurological: Negative for dizziness and weakness.  Psychiatric/Behavioral: Positive for suicidal ideas. Negative for  dysphoric mood.      Allergies  Haldol  Home Medications   Prior to Admission medications   Medication Sig Start Date End Date Taking? Authorizing Provider  ARIPiprazole (ABILIFY) 5 MG tablet Take 1 tablet (5 mg total) by mouth 2 (two) times daily. 06/04/14  Yes Beau FannyJohn C Withrow, FNP  benztropine (COGENTIN) 2 MG tablet Take 1 tablet (2 mg total) by mouth 2 (two) times daily. 06/04/14  Yes Beau FannyJohn C Withrow, FNP  cloNIDine (CATAPRES) 0.2 MG tablet Take 0.2 mg by mouth 2 (two) times daily.   Yes Historical Provider, MD  fluPHENAZine decanoate (PROLIXIN) 25 MG/ML injection Inject 1 mL (25 mg total) into the muscle every 14 (fourteen) days. 06/04/14  Yes Beau FannyJohn C Withrow, FNP  hydrochlorothiazide (HYDRODIURIL) 25 MG tablet Take 1 tablet (25 mg total) by mouth daily. 06/04/14  Yes John C Withrow, FNP   BP 146/80 mmHg  Pulse 93  Resp 12  Ht 5\' 9"  (1.753 m)  Wt 170 lb (77.111 kg)  BMI 25.09 kg/m2  SpO2 98% Physical Exam  Constitutional: He is oriented to person, place, and time. He appears well-developed and well-nourished. No distress.  HENT:  Head: Normocephalic and atraumatic.  Eyes: Conjunctivae and EOM are normal.  Neck: Normal range of motion.  Cardiovascular: Normal rate and regular rhythm.  Exam reveals no gallop and no friction rub.   No murmur heard. Pulmonary/Chest: Effort normal and breath sounds normal. He has no wheezes. He has no rales. He exhibits no tenderness.  Abdominal: Soft. He exhibits no distension. There is no tenderness. There is no rebound.  Musculoskeletal:  Normal range of motion.  Neurological: He is alert and oriented to person, place, and time. Coordination normal.  Speech is goal-oriented. Moves limbs without ataxia.   Skin: Skin is warm and dry.  Psychiatric: He has a normal mood and affect. His behavior is normal.  Nursing note and vitals reviewed.   ED Course  Procedures (including critical care time) Labs Review Labs Reviewed - No data to  display  Imaging Review No results found.   EKG Interpretation None      MDM   Final diagnoses:  Suicidal behavior  Polysubstance abuse    6:11 AM Labs pending. Patient will have TTS evaluation.     Emilia BeckKaitlyn Carlin Attridge, PA-C 06/13/14 1515  Loren Raceravid Yelverton, MD 06/16/14 [phone]

## 2014-06-13 NOTE — BHH Counselor (Signed)
Claudette Headonrad Withrow recommends inpatient. TTS to seek placement.   Kateri PlummerKristin Keonda Dow, M.S., LPCA, [facility] Licensed Professional Counselor Associate  Triage Specialist  [facility]  Therapeutic Triage Services Phone: [phone] Fax: [phone]

## 2014-06-13 NOTE — ED Notes (Signed)
Transporting patient to new room assignment. 

## 2014-06-13 NOTE — ED Notes (Signed)
Per EMS pt states he came to get Metformin and Tylenol. Pt has multiple complaints; Per EMS pt presents some psy behaviors on arrival.

## 2014-06-13 NOTE — ED Notes (Signed)
[employer] at bedside. Pt states that he is calming down.

## 2014-06-13 NOTE — BH Assessment (Addendum)
Tele Assessment Note   Taylor DouglasFredrick L [name] is an 53 y.o. male who [facility] with suicidal ideations without specifying a plan or intent. Pt states that he "feels like he wants to die". Pt reports that he feels like he has no support from family and friends and is "tired of being alone". [place] and is homeless but would like to be in a group home or in "an old folks home". He states he moved here from [place] in the 70's and hasn't heard from his family since. He has had several psychiatric admissions due to chronic SI and psychosis. He endorses hearing voices but can not report what they say. He denies HI. He says he has been in and out of prison and hospitals his whole life. He reports chronic use of substances including crack and "occasionally alcohol". He reports using crack yesterday and uses it everyday. Per Renata Capriceonrad disposition is to be decided.    Axis I: 295.90 Schizophrenia and substance induced mood disorder Axis II: Deferred Axis III:  Past Medical History  Diagnosis Date  . Diabetes mellitus without complication   . Hypertension   . Schizophrenia   . CHF (congestive heart failure)   . Neuropathy   . Polysubstance abuse   . Cocaine abuse   . Homelessness    Axis IV: economic problems, housing problems, occupational problems and other psychosocial or environmental problems Axis V: 21-30 behavior considerably influenced by delusions or hallucinations OR serious impairment in judgment, communication OR inability to function in almost all areas  Past Medical History:  Past Medical History  Diagnosis Date  . Diabetes mellitus without complication   . Hypertension   . Schizophrenia   . CHF (congestive heart failure)   . Neuropathy   . Polysubstance abuse   . Cocaine abuse   . Homelessness     History reviewed. No pertinent past surgical history.  Family History:  Family History  Problem Relation Age of Onset  . Hypertension Other   .  Diabetes Other     Social History:  reports that he has been smoking Cigarettes.  He has a 20 pack-year smoking history. He uses smokeless tobacco. He reports that he drinks alcohol. He reports that he uses illicit drugs ("Crack" cocaine, Cocaine, and Marijuana) about 7 times per week.  Additional Social History:     CIWA: CIWA-Ar BP: 153/81 mmHg Pulse Rate: 96 COWS:    PATIENT STRENGTHS: (choose at least two) General fund of knowledge Motivation for treatment/growth  Allergies:  Allergies  Allergen Reactions  . Haldol [Haloperidol] Other (See Comments)    Muscle spasms, loss of voluntary movement. However, pt has taken Thorazine on multiple occasions with no adverse effects.     Home Medications:  (Not in a hospital admission)  OB/GYN Status:  No LMP for male patient.  General Assessment Data [facility] Is this a Tele or Face-to-Face Assessment?: Tele Assessment Is this an Initial Assessment or a Re-assessment for this encounter?: Initial Assessment Living Arrangements: Alone (homless) Can pt return to current living arrangement?: Yes Admission Status: Voluntary Is patient capable of signing voluntary admission?: Yes Transfer from: Other (Comment) Referral Source: Self/Family/Friend     [facility] Living Arrangements: Alone (homless) Name of Psychiatrist:  [name]([name]) Name of Therapist:  [name]([name])  Education Status Is patient currently in school?: No Current Grade:  (N/A) Highest grade of school patient has completed:  (12th) Name of school:  (  [employer]) Contact person:  (N/A)  Risk to self with the past 6 months Suicidal Ideation: Yes-Currently Present Suicidal Intent: No (could not specify     ) Is patient at risk for suicide?: Yes Suicidal Plan?: No (could not specify ) Access to Means: No (unknown ) Specify Access to Suicidal Means:  (N/A) What has been your use of drugs/alcohol within the last 12 months?:  (daily crack use  occasional alcohol use) Previous Attempts/Gestures: Yes How many times?: 5 (or more) Other Self Harm Risks:  (stress) Triggers for Past Attempts: Other (Comment) (not having support) Intentional Self Injurious Behavior: None Family Suicide History: No Recent stressful life event(s): Other (Comment) Persecutory voices/beliefs?: Yes Depression: Yes Depression Symptoms: Feeling angry/irritable Substance abuse history and/or treatment for substance abuse?: Yes Suicide prevention information given to non-admitted patients: Not applicable  Risk to Others within the past 6 months Homicidal Ideation: No Thoughts of Harm to Others: No Comment - Thoughts of Harm to Others:  (N/A) Current Homicidal Intent: No Current Homicidal Plan: No Describe Current Homicidal Plan:  (N/A) Access to Homicidal Means: No (unknown) Identified Victim:  (denies HI) History of harm to others?: No Assessment of Violence: None Noted Violent Behavior Description:  (N/A) Does patient have access to weapons?: No (unknown) Criminal Charges Pending?:  (unknown) Describe Pending Criminal Charges:  (N/A) Does patient have a court date:  (unknown)  Psychosis Hallucinations: Auditory Delusions: None noted  Mental Status Report Appear/Hygiene: Bizarre, In scrubs Eye Contact: Fair Motor Activity: Restlessness Speech: Aggressive, Rapid, Pressured Level of Consciousness: Alert Mood: Labile, Angry Affect: Appropriate to circumstance Anxiety Level: Moderate Thought Processes: Tangential Judgement: Impaired Obsessive Compulsive Thoughts/Behaviors: Minimal  Cognitive Functioning Concentration: Decreased Memory: Unable to Assess IQ: Average Insight: Poor Impulse Control: Fair Appetite: Poor Weight Loss:  (unknown) Weight Gain:  (unknown) Sleep: Decreased Total Hours of Sleep:  (unknown) Vegetative Symptoms: Unable to Assess  ADLScreening [facility] Assessment Services) Patient's cognitive ability adequate to  safely complete daily activities?: Yes Patient able to express need for assistance with ADLs?: Yes Independently performs ADLs?: Yes (appropriate for developmental age)  Prior Inpatient Therapy Prior Inpatient Therapy: Yes Prior Therapy Dates:  (multiple admits) Prior Therapy Facilty/Provider(s):  ([facility], [facility] [place] [facility], and others. ) Reason for Treatment:  (SI, Psychosis)  Prior Outpatient Therapy Prior Outpatient Therapy: Yes Prior Therapy Dates:  (current) Prior Therapy Facilty/Provider(s):  [facility]) Reason for Treatment:  (med management)  ADL Screening (condition at time of admission) Patient's cognitive ability adequate to safely complete daily activities?: Yes Is the patient deaf or have difficulty hearing?: No Does the patient have difficulty seeing, even when wearing glasses/contacts?: No Does the patient have difficulty concentrating, remembering, or making decisions?: Yes Patient able to express need for assistance with ADLs?: Yes Does the patient have difficulty dressing or bathing?: No Independently performs ADLs?: Yes (appropriate for developmental age) Weakness of Legs: None Weakness of Arms/Hands: None  Home Assistive Devices/Equipment Home Assistive Devices/Equipment: None    Abuse/Neglect Assessment (Assessment to be complete while patient is alone) Physical Abuse: Denies Verbal Abuse: Denies Sexual Abuse: Denies Exploitation of patient/patient's resources: Denies Self-Neglect: Denies     Merchant navy officer (For Healthcare) Does patient have an advance directive?: No Would patient like information on creating an advanced directive?: No - patient declined information    Additional Information 1:1 In Past 12 Months?: No CIRT Risk: Yes (possible, gets agitated) Elopement Risk: No Does patient have medical clearance?: Yes     Disposition:  Disposition Initial Assessment Completed for  this Encounter: Yes Disposition of Patient: Other dispositions  (pending psychiatric disposition) Patient referred to: Other (Comment) Per Claudette Headonrad Withrow  Samatha Anspach 06/13/2014 8:26 AM  Kateri PlummerKristin Vanisha Whiten, M.S., LPCA, [facility] Licensed Professional Counselor Associate  Triage Specialist  [facility]  Therapeutic Triage Services Phone: [phone] Fax: [phone]

## 2014-06-13 NOTE — ED Notes (Signed)
Pt asks RN for a piece of copy paper. RN Cassie handed pt a piece of copy paper. Pt then rips paper in half and starts yelling "I need more than one piece of copy paper. Stankin ass white bitches. I will kill all yall"

## 2014-06-13 NOTE — BH Assessment (Signed)
[facility] Assessment Progress Note    The following facilities were contacted in an attempt to place the pt: OV- No beds per Tameka, but referral faxed for consideration for wait list HH- No beds per Rosalind, but referral faxed for consideration for wait list [facility]-bed[facility] per Diane [facility]-beds per Tom [facility]-beds per Karrie [facility]-possible beds per Barbara [facility]-beds per Linda [facility]-beds per Carla  At capacity: [facility] per Priscilla  [place] per Ira [facility] per [facility] per [facility] per Jackie [facility] per Mary Katherine [facility] per Renda Gaston per Melissa  No answer/left message: [facility]-left message at 0840  Pt declined: n/a  TTS to continue to seek placement for the pt.  Kristen Zalaya Astarita, MS, LPC Licensed Professional Counselor Therapeutic Triage Specialist [facility] Phone: [phone] Fax: [phone]     

## 2014-06-14 DIAGNOSIS — R4589 Other symptoms and signs involving emotional state: Secondary | ICD-10-CM | POA: Insufficient documentation

## 2014-06-14 DIAGNOSIS — F209 Schizophrenia, unspecified: Secondary | ICD-10-CM

## 2014-06-14 DIAGNOSIS — F1994 Other psychoactive substance use, unspecified with psychoactive substance-induced mood disorder: Secondary | ICD-10-CM

## 2014-06-14 DIAGNOSIS — R4689 Other symptoms and signs involving appearance and behavior: Secondary | ICD-10-CM

## 2014-06-14 DIAGNOSIS — F1910 Other psychoactive substance abuse, uncomplicated: Secondary | ICD-10-CM | POA: Insufficient documentation

## 2014-06-14 LAB — CBG MONITORING, ED: Glucose-Capillary: 123 mg/dL — ABNORMAL HIGH (ref 70–99)

## 2014-06-14 MED ORDER — ARIPIPRAZOLE 10 MG PO TABS
5.0000 mg | ORAL_TABLET | Freq: Two times a day (BID) | ORAL | Status: DC
  Administered 2014-06-14: 5 mg via ORAL
  Filled 2014-06-14: qty 1

## 2014-06-14 MED ORDER — BENZTROPINE MESYLATE 1 MG PO TABS
2.0000 mg | ORAL_TABLET | Freq: Two times a day (BID) | ORAL | Status: DC
  Administered 2014-06-14: 2 mg via ORAL
  Filled 2014-06-14: qty 2

## 2014-06-14 MED ORDER — HYDROCHLOROTHIAZIDE 25 MG PO TABS
25.0000 mg | ORAL_TABLET | Freq: Every day | ORAL | Status: DC
  Administered 2014-06-14: 25 mg via ORAL
  Filled 2014-06-14: qty 1

## 2014-06-14 NOTE — Consult Note (Signed)
Telepsych Consultation   Reason for Consult:  Suicidal Ideation (day 2 in ED) Referring Physician:  EDP Taylor DouglasFredrick L Franek is an 53 y.o. male.  Assessment: AXIS I:  Substance Abuse and Substance Induced Mood Disorder, Schizophrenia AXIS II:  Deferred AXIS III:   Past Medical History  Diagnosis Date  . Diabetes mellitus without complication   . Hypertension   . Schizophrenia   . CHF (congestive heart failure)   . Neuropathy   . Polysubstance abuse   . Cocaine abuse   . Homelessness    AXIS IV:  economic problems, occupational problems, other psychosocial or environmental problems, problems related to social environment, problems with access to health care services and problems with primary support group AXIS V:  61-70 mild symptoms  Plan:  Recommend psychiatric Inpatient admission when medically cleared.  Subjective:   [name] is a 53 y.o. male patient [facility] initially with suicidal ideation yet denying plan or intent. Pt is well-known to this NP x 10 years with good rapport. When evaluation started, pt stated "hey buddy, how have you been? I just needed to get some sleep, I don't really want to hurt myself. To be honest, I just had to get off the streets for a night and get some rest and I feel so much better". Pt has a history of making statements about SI or HI, then later admitting that he needed sleep or medication refills. Pt is usually honest and upfront with this provider given the 10 year rapport and his subjective reporting has been consistently reliable with prior consults with this NP. Therefore, subjective denial of suicidal ideation along with the objective findings by nursing, providers, and this NP, all appears to be congruent enough to opine that patient is likely at baseline, no longer presenting a risk to himself. Pt was calm, alert, oriented x4, smiling appropriately, and was able to dictate a clear plan for followup with [name] by  appointment as well as his collaboration with the EDP to get him a current script for his Abilify, which he reports being very effective. He denies HI and AVH at this time as well and would like to discharge to his home. He reports that he now has housing arrangements in a rental property in [place] and only needs a bus pass at this point in time.   HPI:  Taylor DouglasFredrick L Slimp is an 53 y.o. male who [facility] with suicidal ideations without specifying a plan or intent. Pt states that he "feels like he wants to die". Pt reports that he feels like he has no support from family and friends and is "tired of being alone". [place] and is homeless but would like to be in a group home or in "an old folks home". He states he moved here from [place] in the 70's and hasn't heard from his family since. He has had several psychiatric admissions due to chronic SI and psychosis. He endorses hearing voices but can not report what they say. He denies HI. He says he has been in and out of prison and hospitals his whole life. He reports chronic use of substances including crack and "occasionally alcohol". He reports using crack yesterday and uses it everyday. Per Renata Capriceonrad disposition is to be decided.  HPI Elements:   Location:  Inpatient, [facility]. Quality:  Stable. Severity:  Mild/Moderate. Timing:  Constant. Duration: Chronic x 29 yrs Context:  Exacerbation of underlying  diagnoses  Past Psychiatric History: Past Medical History  Diagnosis Date  . Diabetes mellitus without complication   . Hypertension   . Schizophrenia   . CHF (congestive heart failure)   . Neuropathy   . Polysubstance abuse   . Cocaine abuse   . Homelessness     reports that he has been smoking Cigarettes.  He has a 20 pack-year smoking history. He uses smokeless tobacco. He reports that he drinks alcohol. He reports that he uses illicit drugs ("Crack" cocaine, Cocaine, and Marijuana) about 7 times per  week. Family History  Problem Relation Age of Onset  . Hypertension Other   . Diabetes Other    Family History Substance Abuse: Yes, Describe: (crack and alcohol) Family Supports: No Living Arrangements: Alone (homless) Can pt return to current living arrangement?: Yes Allergies:   Allergies  Allergen Reactions  . Haldol [Haloperidol] Other (See Comments)    Muscle spasms, loss of voluntary movement. However, pt has taken Thorazine on multiple occasions with no adverse effects.     ACT Assessment Complete:  Yes:    Educational Status    Risk to Self: Risk to self with the past 6 months Suicidal Ideation: Yes-Currently Present Suicidal Intent: No (could not specify     ) Is patient at risk for suicide?: Yes Suicidal Plan?: No (could not specify ) Access to Means: No (unknown ) Specify Access to Suicidal Means:  (N/A) What has been your use of drugs/alcohol within the last 12 months?:  (daily crack use occasional alcohol use) Previous Attempts/Gestures: Yes How many times?: 5 (or more) Other Self Harm Risks:  (stress) Triggers for Past Attempts: Other (Comment) (not having support) Intentional Self Injurious Behavior: None Family Suicide History: No Recent stressful life event(s): Other (Comment) Persecutory voices/beliefs?: Yes Depression: Yes Depression Symptoms: Feeling angry/irritable Substance abuse history and/or treatment for substance abuse?: Yes (admits to crack abuse) Suicide prevention information given to non-admitted patients: Not applicable  Risk to Others: Risk to Others within the past 6 months Homicidal Ideation: No Thoughts of Harm to Others: No Comment - Thoughts of Harm to Others:  (N/A) Current Homicidal Intent: No Current Homicidal Plan: No Describe Current Homicidal Plan:  (N/A) Access to Homicidal Means: No (unknown) Identified Victim:  (denies HI) History of harm to others?: No Assessment of Violence: None Noted Violent Behavior Description:   (N/A) Does patient have access to weapons?: No (unknown) Criminal Charges Pending?:  (unknown) Describe Pending Criminal Charges:  (N/A) Does patient have a court date:  (unknown)  Abuse: Abuse/Neglect Assessment (Assessment to be complete while patient is alone) Physical Abuse: Denies Verbal Abuse: Denies Sexual Abuse: Denies Exploitation of patient/patient's resources: Denies Self-Neglect: Denies  Prior Inpatient Therapy: Prior Inpatient Therapy Prior Inpatient Therapy: Yes Prior Therapy Dates:  (multiple admits) Prior Therapy Facilty/Provider(s):  ([facility], [facility], and others. ) Reason for Treatment:  (SI, Psychosis)  Prior Outpatient Therapy: Prior Outpatient Therapy Prior Outpatient Therapy: Yes Prior Therapy Dates:  (current) Prior Therapy Facilty/Provider(s):  [facility] cur[facility]) Reason for Treatment:  (med management)  Additional Information: Additional Information 1:1 In Past 12 Months?: No CIRT Risk: Yes (possible, gets agitated) Elopement Risk: No Does patient have medical clearance?: Yes                  Objective: Blood pressure 164/83, pulse 77, temperature 97.1 F (36.2 C), temperature source Oral, resp. rate 20, height 5\' 9"  (1.753 m), weight 77.111 kg (170 lb), SpO2 98 %.Body mass index is 25.09 kg/(m^2).  No results found for this or any previous visit (from the past 72 hour(s)). Labs are reviewed and are pertinent for Potassium 3.3, (+) cocaine.   Current Facility-Administered Medications  Medication Dose Route Frequency Provider Last Rate Last Dose  . acetaminophen (TYLENOL) tablet 650 mg  650 mg Oral Q4H PRN Emilia BeckKaitlyn Szekalski, PA-C   650 mg at 06/14/14 1044  . alum & mag hydroxide-simeth (MAALOX/MYLANTA) 200-200-20 MG/5ML suspension 30 mL  30 mL Oral PRN Emilia BeckKaitlyn Szekalski, PA-C   30 mL at 06/13/14 1619  . ARIPiprazole (ABILIFY) tablet 5 mg  5 mg Oral BID Mirian MoMatthew Gentry, MD   5 mg at 06/14/14 1043  . benztropine (COGENTIN) tablet 2 mg  2 mg Oral BID  Mirian MoMatthew Gentry, MD   2 mg at 06/14/14 1043  . hydrochlorothiazide (HYDRODIURIL) tablet 25 mg  25 mg Oral Daily Mirian MoMatthew Gentry, MD   25 mg at 06/14/14 1043  . ibuprofen (ADVIL,MOTRIN) tablet 600 mg  600 mg Oral Q8H PRN Emilia BeckKaitlyn Szekalski, PA-C      . LORazepam (ATIVAN) tablet 1 mg  1 mg Oral Q8H PRN Emilia BeckKaitlyn Szekalski, PA-C   1 mg at 06/13/14 1619  . ondansetron (ZOFRAN) tablet 4 mg  4 mg Oral Q8H PRN Kaitlyn Szekalski, PA-C      . zolpidem (AMBIEN) tablet 5 mg  5 mg Oral QHS PRN Emilia BeckKaitlyn Szekalski, PA-C       Current Outpatient Prescriptions  Medication Sig Dispense Refill  . ARIPiprazole (ABILIFY) 5 MG tablet Take 1 tablet (5 mg total) by mouth 2 (two) times daily. 60 tablet 0  . benztropine (COGENTIN) 2 MG tablet Take 1 tablet (2 mg total) by mouth 2 (two) times daily. 30 tablet 0  . cloNIDine (CATAPRES) 0.2 MG tablet Take 0.2 mg by mouth 2 (two) times daily.    . fluPHENAZine decanoate (PROLIXIN) 25 MG/ML injection Inject 1 mL (25 mg total) into the muscle every 14 (fourteen) days. 5 mL   . hydrochlorothiazide (HYDRODIURIL) 25 MG tablet Take 1 tablet (25 mg total) by mouth daily. 30 tablet 0    Psychiatric Specialty Exam:     Blood pressure 164/83, pulse 77, temperature 97.1 F (36.2 C), temperature source Oral, resp. rate 20, height 5\' 9"  (1.753 m), weight 77.111 kg (170 lb), SpO2 98 %.Body mass index is 25.09 kg/(m^2).  General Appearance: Disheveled  Eye Contact::  Good  Speech:  Clear and Coherent  Volume:  Normal  Mood:  Euthymic  Affect:  Appropriate  Thought Process:  Goal Directed  Orientation:  Full (Time, Place, and Person)  Thought Content:  WDL  Suicidal Thoughts:  No  Homicidal Thoughts:  No  Memory:  Immediate;   Good Recent;   Good Remote;   Good  Judgement:  Fair  Insight:  Fair  Psychomotor Activity:  Normal  Concentration:  Good  Recall:  Fair  Akathisia:  NA  Handed:  Right  AIMS (if indicated):     Assets:  Communication Skills Desire for  Improvement Resilience  Sleep:      Treatment Plan Summary: See below  Disposition:  Discharge home and pt will followup with [name] as scheduled. He reports he already had an Abilify prescription from the EDP.    *Reviewed with Dr. Lucianne MussKumar.   Beau FannyWithrow, Naven Giambalvo C, FNP-BC 06/14/2014 09:41AM

## 2014-06-14 NOTE — ED Provider Notes (Signed)
10:49- Evaluation for discharge.  No suicidal ideation, HI or intent to cause problem. States he has his medicine.  He has been seen by TTS, who [facility]  Patient understands these plans.  Taylor MelterElliott L Brailynn Breth, MD 06/14/14 [phone]

## 2014-06-14 NOTE — ED Notes (Signed)
[name]

## 2014-06-14 NOTE — ED Notes (Signed)
Pt up to nurses station. States he would like to go home. He states he is not suicidal or homicidal. Advised pt that i spoke to [facility] and they will have him reevaluated this morning. Pt agreeable with plan

## 2014-06-14 NOTE — ED Notes (Signed)
Pt belongings returned  to him 

## 2014-06-14 NOTE — Discharge Instructions (Signed)
Do not use alcohol or illegal drugs.  Go to the Well[facility] clinic for medical problems and to get help getting your medications.  [facility].

## 2014-06-22 ENCOUNTER — Emergency Department (HOSPITAL_COMMUNITY)
Admission: EM | Admit: 2014-06-22 | Discharge: 2014-06-22 | Disposition: A | Discharge status: Home / Self Care | Payer: Medicare Other | Attending: Emergency Medicine | Admitting: Emergency Medicine

## 2014-06-22 ENCOUNTER — Encounter (HOSPITAL_COMMUNITY): Payer: Self-pay | Admitting: *Deleted

## 2014-06-22 DIAGNOSIS — F331 Major depressive disorder, recurrent, moderate: Secondary | ICD-10-CM | POA: Insufficient documentation

## 2014-06-22 DIAGNOSIS — Z79899 Other long term (current) drug therapy: Secondary | ICD-10-CM | POA: Insufficient documentation

## 2014-06-22 DIAGNOSIS — I509 Heart failure, unspecified: Secondary | ICD-10-CM | POA: Insufficient documentation

## 2014-06-22 DIAGNOSIS — F1420 Cocaine dependence, uncomplicated: Secondary | ICD-10-CM | POA: Insufficient documentation

## 2014-06-22 DIAGNOSIS — R61 Generalized hyperhidrosis: Secondary | ICD-10-CM | POA: Insufficient documentation

## 2014-06-22 DIAGNOSIS — F1910 Other psychoactive substance abuse, uncomplicated: Secondary | ICD-10-CM | POA: Insufficient documentation

## 2014-06-22 DIAGNOSIS — F209 Schizophrenia, unspecified: Secondary | ICD-10-CM | POA: Insufficient documentation

## 2014-06-22 DIAGNOSIS — F1410 Cocaine abuse, uncomplicated: Secondary | ICD-10-CM | POA: Insufficient documentation

## 2014-06-22 DIAGNOSIS — E119 Type 2 diabetes mellitus without complications: Secondary | ICD-10-CM | POA: Insufficient documentation

## 2014-06-22 DIAGNOSIS — Z590 Homelessness: Secondary | ICD-10-CM | POA: Insufficient documentation

## 2014-06-22 DIAGNOSIS — Z720 Tobacco use: Secondary | ICD-10-CM | POA: Insufficient documentation

## 2014-06-22 DIAGNOSIS — Z8669 Personal history of other diseases of the nervous system and sense organs: Secondary | ICD-10-CM | POA: Insufficient documentation

## 2014-06-22 DIAGNOSIS — F1994 Other psychoactive substance use, unspecified with psychoactive substance-induced mood disorder: Secondary | ICD-10-CM

## 2014-06-22 DIAGNOSIS — F419 Anxiety disorder, unspecified: Secondary | ICD-10-CM | POA: Insufficient documentation

## 2014-06-22 LAB — COMPREHENSIVE METABOLIC PANEL
ALK PHOS: 77 U/L (ref 39–117)
ALT: 11 U/L (ref 0–53)
ANION GAP: 7 (ref 5–15)
AST: 24 U/L (ref 0–37)
Albumin: 3.6 g/dL (ref 3.5–5.2)
BUN: 15 mg/dL (ref 6–23)
CO2: 26 mmol/L (ref 19–32)
CREATININE: 0.98 mg/dL (ref 0.50–1.35)
Calcium: 9 mg/dL (ref 8.4–10.5)
Chloride: 106 mEq/L (ref 96–112)
GFR calc non Af Amer: >90 mL/min (ref >90)
GLUCOSE: 175 mg/dL — AB (ref 70–99)
Potassium: 3.5 mmol/L (ref 3.5–5.1)
Sodium: 139 mmol/L (ref 135–145)
Total Bilirubin: 0.4 mg/dL (ref 0.3–1.2)
Total Protein: 5.9 g/dL — ABNORMAL LOW (ref 6.0–8.3)

## 2014-06-22 LAB — RAPID URINE DRUG SCREEN, HOSP PERFORMED
Amphetamines: NOT DETECTED
Barbiturates: NOT DETECTED
Benzodiazepines: NOT DETECTED
Cocaine: POSITIVE — AB
OPIATES: NOT DETECTED
TETRAHYDROCANNABINOL: NOT DETECTED

## 2014-06-22 LAB — CBC
HCT: 33.1 % — ABNORMAL LOW (ref 39.0–52.0)
HEMOGLOBIN: 10.5 g/dL — AB (ref 13.0–17.0)
MCH: 28.8 pg (ref 26.0–34.0)
MCHC: 31.7 g/dL (ref 30.0–36.0)
MCV: 90.7 fL (ref 78.0–100.0)
Platelets: 223 10*3/uL (ref 150–400)
RBC: 3.65 MIL/uL — ABNORMAL LOW (ref 4.22–5.81)
RDW: 13.3 % (ref 11.5–15.5)
WBC: 6.8 10*3/uL (ref 4.0–10.5)

## 2014-06-22 LAB — ACETAMINOPHEN LEVEL: Acetaminophen (Tylenol), Serum: <10 ug/mL — ABNORMAL LOW (ref 10–30)

## 2014-06-22 LAB — ETHANOL

## 2014-06-22 LAB — SALICYLATE LEVEL: Salicylate Lvl: <4 mg/dL (ref 2.8–20.0)

## 2014-06-22 MED ORDER — HYDROCHLOROTHIAZIDE 25 MG PO TABS
25.0000 mg | ORAL_TABLET | Freq: Every day | ORAL | Status: DC
  Administered 2014-06-22: 25 mg via ORAL
  Filled 2014-06-22: qty 1

## 2014-06-22 MED ORDER — ACETAMINOPHEN 325 MG PO TABS
650.0000 mg | ORAL_TABLET | Freq: Once | ORAL | Status: AC
  Administered 2014-06-22: 650 mg via ORAL
  Filled 2014-06-22: qty 2

## 2014-06-22 MED ORDER — BENZTROPINE MESYLATE 1 MG PO TABS
2.0000 mg | ORAL_TABLET | Freq: Two times a day (BID) | ORAL | Status: DC
  Administered 2014-06-22: 2 mg via ORAL
  Filled 2014-06-22: qty 2

## 2014-06-22 MED ORDER — CLONIDINE HCL 0.2 MG PO TABS
0.2000 mg | ORAL_TABLET | Freq: Two times a day (BID) | ORAL | Status: DC
  Administered 2014-06-22: 0.2 mg via ORAL
  Filled 2014-06-22: qty 1

## 2014-06-22 MED ORDER — LORAZEPAM 2 MG/ML IJ SOLN
2.0000 mg | Freq: Once | INTRAMUSCULAR | Status: AC
  Administered 2014-06-22: 2 mg via INTRAMUSCULAR
  Filled 2014-06-22: qty 1

## 2014-06-22 MED ORDER — LORAZEPAM 1 MG PO TABS: 1.0000 mg | ORAL_TABLET | ORAL | Status: DC | PRN

## 2014-06-22 MED ORDER — ARIPIPRAZOLE 10 MG PO TABS: 5.0000 mg | ORAL_TABLET | Freq: Two times a day (BID) | ORAL | Status: DC

## 2014-06-22 NOTE — ED Notes (Addendum)
Pt stated that he did not want to have leads, blood pressure cuff or pulse O2 on his body. Pt claims he has circulation problems in his chest and throat and that those items bother him. Pt has stated that he wants to leave. Pt has been agitated on and off. Pt becomes agitated when he thinks about his ex wife and her family.

## 2014-06-22 NOTE — Consult Note (Signed)
Telepsych Consultation   Reason for Consult:  Agitation, Substance abuse, mood lability Referring Physician:  EDP [name] is an 53 y.o. male.  Assessment: AXIS I:  Substance Abuse and Substance Induced Mood Disorder, Schizophrenia AXIS II:  Deferred AXIS III:   Past Medical History  Diagnosis Date  . Diabetes mellitus without complication   . Hypertension   . Schizophrenia   . CHF (congestive heart failure)   . Neuropathy   . Polysubstance abuse   . Cocaine abuse   . Homelessness    AXIS IV:  economic problems, occupational problems, other psychosocial or environmental problems, problems related to social environment, problems with access to health care services and problems with primary support group AXIS V:  61-70 mild symptoms  Plan:  No evidence of imminent risk to self or others at present.   Patient does not meet criteria for psychiatric inpatient admission. Supportive therapy provided about ongoing stressors. Refer to IOP. Discussed crisis plan, support from social network, calling 911, coming to the Emergency Department, and calling Suicide Hotline.  Subjective:   [name] is a 53 y.o. male patient presenting to the [facility] initially with reports of crack/cocaine abuse and severe lability with agitation. Pt is well-known to this NP x 10 years with good rapport. Pt was very somnolent during assessment (just given Ativan) and fell asleep a few times but was able to make himself sit up in bed to try to focus on the assessment and he apologized for falling asleep. Pt clearly denies SI, HI, and AVH, contracts for safety and states that he can followup with [facility], whom he is known to receive care from. Pt appears to be at baseline and is presenting better than his last few ED visits. Pt asked if we can keep him for "48 hours to get some rest" and this NP informed him that he doesn't meet criteria but that we can let him sleep a few more hours since he can't  keep his eyes open, then discharge him this evening. Pt affirmed understanding and reports that he still has a home to return to as well.   HPI:  [name] is an 53 y.o. male. Pt presents to [facility] with C/O medical clearance. Pt reports daily Crack/Cocaine use and reports that he has a problems. Pt made a joke that he smokes "crack out his ass", stating that is something someone told him in prison.Pt presents agitated and belligerent during TTS assessment. Pt cursing and speaks about someone "fucking" his wife" and becomes extremely agitated when speaking about her. Pt presents hostile and angry when speaking to assessor. Pt's mood is observed as labile and angry. Pt reports that he has a problem with using Crack/Cocaine. Prior history of THC and occasional etoh use noted from previous assessment. Pt reports that he came to the hospital to get "help", pt unable to specify what he needs help with after being prompted. Pt states that he was willing to get some help until someone walked in his room smelling like perfume and that upset him and now he wants a bus pass to go home. Pt denies current SI,HI, and no AVH reported. Pt reports that he receives mental health outpatient services from [facility] to include medication management. Pt reports that he is suppose to be set up with [name] ACTT services but missed his appointment for ACTT services and to get started on his Abilify. Patient reports that he has an appt. with [facility].  HPI  Elements:   Location:  Inpatient, [facility]. Quality:  Stable. Severity:  Mild/Moderate. Timing:  Constant. Duration: Chronic x 29 yrs Context:  Exacerbation of underlying diagnoses  Past Psychiatric History: Past Medical History  Diagnosis Date  . Diabetes mellitus without complication   . Hypertension   . Schizophrenia   . CHF (congestive heart failure)   . Neuropathy   . Polysubstance abuse   . Cocaine abuse   . Homelessness     reports that he has been  smoking Cigarettes.  He has a 20 pack-year smoking history. He uses smokeless tobacco. He reports that he drinks alcohol. He reports that he uses illicit drugs ("Crack" cocaine, Cocaine, and Marijuana) about 7 times per week. Family History  Problem Relation Age of Onset  . Hypertension Other   . Diabetes Other    Family History Substance Abuse: Yes, Describe: (prior hx of crack and etoh noted.) Family Supports: No Living Arrangements: Other relatives Can pt return to current living arrangement?: Yes Allergies:   Allergies  Allergen Reactions  . Haldol [Haloperidol] Other (See Comments)    Muscle spasms, loss of voluntary movement. However, pt has taken Thorazine on multiple occasions with no adverse effects.     ACT Assessment Complete:  Yes:    Educational Status    Risk to Self: Risk to self with the past 6 months Suicidal Ideation: No (pt denies to this Probation officer, ED provider reports otherwise) Suicidal Intent: No Is patient at risk for suicide?: Yes Suicidal Plan?: No Specify Current Suicidal Plan: no plan Access to Means: No Specify Access to Suicidal Means: na What has been your use of drugs/alcohol within the last 12 months?: daily crack use, prior hx of  THC and Etoh use reported from prior assessment Previous Attempts/Gestures: Yes How many times?:  (pt unable to specify at this time.) Other Self Harm Risks: agitation and stress d/t substance use and legal issues, no natural supports Triggers for Past Attempts: Unpredictable Intentional Self Injurious Behavior: None Comment - Self Injurious Behavior: na Family Suicide History: No Recent stressful life event(s): Legal Issues, Other (Comment) (Substance Abuse Addiction, no natural supports.) Persecutory voices/beliefs?: No Depression: No Depression Symptoms: Feeling worthless/self pity, Feeling angry/irritable Substance abuse history and/or treatment for substance abuse?: Yes (states he has been on a crack binge for about  a week) Suicide prevention information given to non-admitted patients: Not applicable  Risk to Others: Risk to Others within the past 6 months Homicidal Ideation: No Thoughts of Harm to Others: No Comment - Thoughts of Harm to Others: na Current Homicidal Intent: No Current Homicidal Plan: No Describe Current Homicidal Plan: na Access to Homicidal Means: No Describe Access to Homicidal Means: na Identified Victim: denies HI History of harm to others?: No Assessment of Violence: None Noted Violent Behavior Description: Agitated, Beligerant during TTS assessment Does patient have access to weapons?: No Criminal Charges Pending?: Yes Describe Pending Criminal Charges: Misdemeanor charges for trespassing, "non Violently" resisting arrest of an officer Does patient have a court date: Yes Court Date:  (Court date in January 2016, pt unable to specify.)  Abuse: Abuse/Neglect Assessment (Assessment to be complete while patient is alone) Physical Abuse: Denies Verbal Abuse: Denies Sexual Abuse: Denies Exploitation of patient/patient's resources: Denies Self-Neglect: Denies  Prior Inpatient Therapy: Prior Inpatient Therapy Prior Inpatient Therapy: Yes Prior Therapy Dates: multiple admits Prior Therapy Facilty/Provider(s): [facility], [facility], and other facilities. Reason for Treatment: SI, Psychosis, SA  Prior Outpatient Therapy: Prior Outpatient Therapy Prior Outpatient Therapy: Yes Prior  Therapy Dates: Current Provider Prior Therapy Facilty/Provider(s): [name] Reason for Treatment: ACTT services, Psychiatry  Additional Information: Additional Information 1:1 In Past 12 Months?: No CIRT Risk: Yes (possible due to level of agitation.) Elopement Risk: Yes (Pt wants to leave ER.) Does patient have medical clearance?: Yes    Objective: Blood pressure 159/80, pulse 94, temperature 97.2 F (36.2 C), temperature source Oral, resp. rate 22, SpO2 100 %.There is no weight on file to calculate  BMI. Results for orders placed or performed during the hospital encounter of 06/22/14 (from the past 72 hour(s))  Acetaminophen level     Status: Abnormal   Collection Time: 06/22/14  4:54 AM  Result Value Ref Range   Acetaminophen (Tylenol), Serum <10.0 (L) 10 - 30 ug/mL    Comment:        THERAPEUTIC CONCENTRATIONS VARY SIGNIFICANTLY. A RANGE OF 10-30 ug/mL MAY BE AN EFFECTIVE CONCENTRATION FOR MANY PATIENTS. HOWEVER, SOME ARE BEST TREATED AT CONCENTRATIONS OUTSIDE THIS RANGE. ACETAMINOPHEN CONCENTRATIONS >150 ug/mL AT 4 HOURS AFTER INGESTION AND >50 ug/mL AT 12 HOURS AFTER INGESTION ARE OFTEN ASSOCIATED WITH TOXIC REACTIONS.   CBC     Status: Abnormal   Collection Time: 06/22/14  4:54 AM  Result Value Ref Range   WBC 6.8 4.0 - 10.5 K/uL   RBC 3.65 (L) 4.22 - 5.81 MIL/uL   Hemoglobin 10.5 (L) 13.0 - 17.0 g/dL   HCT 33.1 (L) 39.0 - 52.0 %   MCV 90.7 78.0 - 100.0 fL   MCH 28.8 26.0 - 34.0 pg   MCHC 31.7 30.0 - 36.0 g/dL   RDW 13.3 11.5 - 15.5 %   Platelets 223 150 - 400 K/uL  Comprehensive metabolic panel     Status: Abnormal   Collection Time: 06/22/14  4:54 AM  Result Value Ref Range   Sodium 139 135 - 145 mmol/L    Comment: Please note change in reference range.   Potassium 3.5 3.5 - 5.1 mmol/L    Comment: Please note change in reference range.   Chloride 106 96 - 112 mEq/L   CO2 26 19 - 32 mmol/L   Glucose, Bld 175 (H) 70 - 99 mg/dL   BUN 15 6 - 23 mg/dL   Creatinine, Ser 0.98 0.50 - 1.35 mg/dL   Calcium 9.0 8.4 - 10.5 mg/dL   Total Protein 5.9 (L) 6.0 - 8.3 g/dL   Albumin 3.6 3.5 - 5.2 g/dL   AST 24 0 - 37 U/L   ALT 11 0 - 53 U/L   Alkaline Phosphatase 77 39 - 117 U/L   Total Bilirubin 0.4 0.3 - 1.2 mg/dL   GFR calc non Af Amer >90 >90 mL/min   GFR calc Af Amer >90 >90 mL/min    Comment: (NOTE) The eGFR has been calculated using the CKD EPI equation. This calculation has not been validated in all clinical situations. eGFR's persistently <90 mL/min signify  possible Chronic Kidney Disease.    Anion gap 7 5 - 15  Ethanol (ETOH)     Status: None   Collection Time: 06/22/14  4:54 AM  Result Value Ref Range   Alcohol, Ethyl (B) <5 0 - 9 mg/dL    Comment:        LOWEST DETECTABLE LIMIT FOR SERUM ALCOHOL IS 11 mg/dL FOR MEDICAL PURPOSES ONLY   Salicylate level     Status: None   Collection Time: 06/22/14  4:54 AM  Result Value Ref Range   Salicylate Lvl <3.8 2.8 - 20.0  mg/dL  Urine Drug Screen     Status: Abnormal   Collection Time: 06/22/14  9:15 AM  Result Value Ref Range   Opiates NONE DETECTED NONE DETECTED   Cocaine POSITIVE (A) NONE DETECTED   Benzodiazepines NONE DETECTED NONE DETECTED   Amphetamines NONE DETECTED NONE DETECTED   Tetrahydrocannabinol NONE DETECTED NONE DETECTED   Barbiturates NONE DETECTED NONE DETECTED    Comment:        DRUG SCREEN FOR MEDICAL PURPOSES ONLY.  IF CONFIRMATION IS NEEDED FOR ANY PURPOSE, NOTIFY LAB WITHIN 5 DAYS.        LOWEST DETECTABLE LIMITS FOR URINE DRUG SCREEN Drug Class       Cutoff (ng/mL) Amphetamine      1000 Barbiturate      200 Benzodiazepine   979 Tricyclics       892 Opiates          300 Cocaine          300 THC              50    Labs are reviewed and are pertinent for UDS (+) cocaine.   Current Facility-Administered Medications  Medication Dose Route Frequency Provider Last Rate Last Dose  . ARIPiprazole (ABILIFY) tablet 5 mg  5 mg Oral BID Viona Gilmore Cart[place], PA-C      . benztropine (COGENTIN) tablet 2 mg  2 mg Oral BID Viona Gilmore [place], PA-C      . cloNIDine (CATAPRES) tablet 0.2 mg  0.2 mg Oral BID Viona Gilmore Cartner, PA-C      . hydrochlorothiazide (HYDRODIURIL) tablet 25 mg  25 mg Oral Daily Viona Gilmore [place], PA-C      . LORazepam (ATIVAN) tablet 1 mg  1 mg Oral Q4H PRN Malvin Johns, MD       Current Outpatient Prescriptions  Medication Sig Dispense Refill  . ARIPiprazole (ABILIFY) 5 MG tablet Take 1 tablet (5 mg total) by mouth 2 (two) times daily. 60  tablet 0  . benztropine (COGENTIN) 2 MG tablet Take 1 tablet (2 mg total) by mouth 2 (two) times daily. 30 tablet 0  . cloNIDine (CATAPRES) 0.2 MG tablet Take 0.2 mg by mouth 2 (two) times daily.    . fluPHENAZine decanoate (PROLIXIN) 25 MG/ML injection Inject 1 mL (25 mg total) into the muscle every 14 (fourteen) days. 5 mL   . hydrochlorothiazide (HYDRODIURIL) 25 MG tablet Take 1 tablet (25 mg total) by mouth daily. 30 tablet 0    Psychiatric Specialty Exam:     Blood pressure 159/80, pulse 94, temperature 97.2 F (36.2 C), temperature source Oral, resp. rate 22, SpO2 100 %.There is no weight on file to calculate BMI.  General Appearance: Disheveled  Eye Contact::  Good  Speech:  Clear and Coherent  Volume:  Normal  Mood:  Euthymic  Affect:  Appropriate  Thought Process:  Goal Directed  Orientation:  Full (Time, Place, and Person)  Thought Content:  WDL  Suicidal Thoughts:  No  Homicidal Thoughts:  No  Memory:  Immediate;   Good Recent;   Good Remote;   Good  Judgement:  Fair  Insight:  Fair  Psychomotor Activity:  Normal  Concentration:  Good  Recall:  Fair  Akathisia:  NA  Handed:  Right  AIMS (if indicated):     Assets:  Communication Skills Desire for Improvement Resilience  Sleep:      Treatment Plan Summary: See below  Disposition:  -Discharge home and pt will  followup with [facility] as scheduled. -Allow pt to rest a few hours to clear Ativan to where pt can ambulate and ride the bus safely. -Give pt bus pass as needed (from social work)   *Reviewed with Dr. Parke Poisson.   Benjamine Mola, FNP-BC 06/22/2014 09:55AM  Case reviewed with me as above

## 2014-06-22 NOTE — ED Notes (Signed)
Pt has eaten 100% of his breakfast

## 2014-06-22 NOTE — Discharge Instructions (Signed)
Polysubstance Abuse When people abuse more than one drug or type of drug it is called polysubstance or polydrug abuse. For example, many smokers also drink alcohol. This is one form of polydrug abuse. Polydrug abuse also refers to the use of a drug to counteract an unpleasant effect produced by another drug. It may also be used to help with withdrawal from another drug. People who take stimulants may become agitated. Sometimes this agitation is countered with a tranquilizer. This helps protect against the unpleasant side effects. Polydrug abuse also refers to the use of different drugs at the same time.  Anytime drug use is interfering with normal living activities, it has become abuse. This includes problems with family and friends. Psychological dependence has developed when your mind tells you that the drug is needed. This is usually followed by physical dependence which has developed when continuing increases of drug are required to get the same feeling or "high". This is known as addiction or chemical dependency. A person's risk is much higher if there is a history of chemical dependency in the family. SIGNS OF CHEMICAL DEPENDENCY  You have been told by friends or family that drugs have become a problem.  You fight when using drugs.  You are having blackouts (not remembering what you do while using).  You feel sick from using drugs but continue using.  You lie about use or amounts of drugs (chemicals) used.  You need chemicals to get you going.  You are suffering in work performance or in school because of drug use.  You get sick from use of drugs but continue to use anyway.  You need drugs to relate to people or feel comfortable in social situations.  You use drugs to forget problems. "Yes" answered to any of the above signs of chemical dependency indicates there are problems. The longer the use of drugs continues, the greater the problems will become. If there is a family history of  drug or alcohol use, it is best not to experiment with these drugs. Continual use leads to tolerance. After tolerance develops more of the drug is needed to get the same feeling. This is followed by addiction. With addiction, drugs become the most important part of life. It becomes more important to take drugs than participate in the other usual activities of life. This includes relating to friends and family. Addiction is followed by dependency. Dependency is a condition where drugs are now needed not just to get high, but to feel normal. Addiction cannot be cured but it can be stopped. This often requires outside help and the care of professionals. Treatment centers are listed in the yellow pages under: Cocaine, Narcotics, and Alcoholics Anonymous. Most hospitals and clinics can refer you to a specialized care center. Talk to your caregiver if you need help. Document Released: 02/06/2005 Document Revised: 09/09/2011 Document Reviewed: 06/17/2005 [facility] Patient Information 2015 [place], [place]. This information is not intended to replace advice given to you by your health care provider. Make sure you discuss any questions you have with your health care provider. Emergency Department Resource Guide 1) Find a Doctor and Pay Out of Pocket Although you won't have to find out who is covered by your insurance plan, it is a good idea to ask around and get recommendations. You will then need to call the office and see if the doctor you have chosen will accept you as a new patient and what types of options they offer for patients who are self-pay. Some  doctors offer discounts or will set up payment plans for their patients who do not have insurance, but you will need to ask so you aren't surprised when you get to your appointment.  2) Contact Your Local Health Department Not all health departments have doctors that can see patients for sick visits, but many do, so it is worth a call to see if yours does. If you  don't know where your local health department is, you can check in your phone book. The CDC also has a tool to help you locate your state's health department, and many state websites also have listings of all of their local health departments.  3) Find a Walk-in Clinic If your illness is not likely to be very severe or complicated, you may want to try a walk in clinic. These are popping up all over the country in pharmacies, drugstores, and shopping centers. They're usually staffed by nurse practitioners or physician assistants that have been trained to treat common illnesses and complaints. They're usually fairly quick and inexpensive. However, if you have serious medical issues or chronic medical problems, these are probably not your best option.  No Primary Care Doctor: - Call Health Connect at  [phone] - they can help you locate a primary care doctor that  accepts your insurance, provides certain services, etc. - Physician Referral Service- [phone]  Chronic Pain Problems: Organization         Address     Phone             Notes  [facility]  [phone] Patients need to be referred by their primary care doctor.   Medication Assistance: Organization         Address     Phone             Notes  [facility] Medication [facility] [address] [place], [place] [phone] --Must be a resident of [facility] [place] -- Must have NO insurance coverage whatsoever (no Medicaid/ Medicare, etc.) -- The pt. MUST have a primary care doctor that directs their care regularly and follows them in the community   MedAssist  [phone]   [employer]  [phone]    Agencies that provide inexpensive medical care: Organization         Address     Phone             Notes  [facility]  [phone]   [facility]    [phone]   [facility] [address] [place], [place]  [phone]   [facility] [address], [place] [phone]   [facility]anned Parenthood    [phone]   [facility]    [phone]   [facility] and [facility]  [address], [place] Phone:  [phone], Fax:  [phone] Hours of Operation:  9 am - 6 pm, M-F.  Also accepts Medicaid/Medicare and self-pay.  [facility]  [address], [place] Phone: [phone], Fax: [phone]. Hours of Operation:  8:30 am - 5:30 pm, M-F.  Also accepts Medicaid and self-pay.  [facility] [place] [address], [place] Phone: [phone]   [facility]     [address], [place], [place] [phone], Ext. [phone] Mondays & Thursdays: 7-9 AM.  First 15  patients are seen on a first come, first serve basis.   [facility] [place] [address] [place] [address] [phone] Accepts Medicaid   Medicaid-accepting [facility] Providers:  Organization         Address     Phone             Notes  [facility] [address], [place] [phone] Also accepts self-pay patients.  [facility] [address] Laurell Josephs [place], [place]  [phone]   [facility] [address], [place] [phone]   [facility] [address] [place] [phone]   Renaye Rakers [address], [place]   [phone] Only accepts [place] Access [place] patients after they have their name applied to their card.   Self-Pay (no insurance) in [facility]:  Organization         Address     Phone             Notes  Sickle Cell Patients, [facility] [address] [place], [place] [phone]   [facility] [address] [place], [place] [phone]   [facility] [place]  [address], [place] [phone]   [facility]/Dr. Osei-Bonsu  [address], [place] or [address], [place] [phone] Phone number for both [place] and [place] locations is the same.  [facility]gent Medical and [facility] [address], [place] [phone]   [facility] [address], [place] or [address] [phone]   [facility] [address], [place] [phone], phone; [phone], fax Sees patients 1st and 3rd Saturday of every month.  Must not qualify for public or private insurance (i.e. Medicaid, Medicare, [place] Health Choice, Veterans' Benefits)  Household income should be no more than 200% of the poverty level The clinic cannot treat you if you are pregnant or think you are pregnant  Sexually transmitted diseases are not treated at the clinic.    Dental Care:  Organization         Address     Phone             Notes  [facility] Department of Gastro[facility] [address] [place], [place] [phone] Accepts children up to age 62 who are enrolled in [place] or [place] Health Choice; pregnant women with a Medicaid card; and children who have applied for Medicaid or [place] Health Choice, but were declined, whose parents can pay a reduced fee at time of service.  [facility] Department of [facility]  [address], [place] [phone] Accepts children up to age 86 who are enrolled in [place] or [place] Health Choice; pregnant women with a Medicaid card; and children who have applied for Medicaid or [place] Health Choice, but were declined, whose parents can pay a reduced fee at time of service.  [facility]  [address] [place], [place] [phone] Patients are seen by appointment only. Walk-ins are not accepted. [facility] will see patients 72 years of age and older. Monday -  Tuesday (8am-5pm) Most Wednesdays (8:30-5pm) $30 per visit, cash only  [employer] [facility] [employer] [facility] PROGRAM  [address], [facility] [phone] Patients are seen by appointment only. Walk-ins are not accepted. [facility] will see patients 41 years of age and older. One Wednesday Evening (Monthly: Volunteer Based).  $30 per visit, cash only  [employer]  [phone] for adults; Children under age 67, call Graduate Pediatric Dentistry at [phone]. Children aged 53-14, please call [phone] to request a pediatric application.  Dental services are provided in all areas of dental care including fillings, crowns and bridges, complete and partial dentures, implants, gum treatment, root canals, and extractions. Preventive care is also provided. Treatment is provided to both adults and children. Patients are selected via a lottery and there is often a waiting list.   [facility] [address] [place]  [phone] [email or web address]   [facility] [address] [place], [place] [phone], Ext. [phone] Second and Fourth Thursday of each month, opens at 6:30 AM; Clinic ends at 9 AM.  Patients are seen on a first-come first-served basis, and a limited number are seen during each clinic.   [facility]  [address] Ether Griffins [place], [place] [phone]   Eligibility Requirements You must have lived in [place], [place], or [place] counties for at least the last three months.   You cannot be eligible for state or federal sponsored [place], including [employer], [place], or [employer].   You generally cannot be eligible for healthcare insurance through your employer.    How to apply: Eligibility screenings are held every Tuesday and Wednesday afternoon from 1:00 pm until 4:00 pm. You do not need an appointment for the interview!  [facility] [address], [place], [place]  [phone]   [facility] Health Department  [phone]   [facility] Health Department  [phone]   [facility] Health Department  [phone]    Behavioral Health Resources in the Community: Intensive Outpatient Programs Organization         Address     Phone             Notes  [facility] [address], [place], [place] [phone]   [facility] Outpatient [address] [place], [place] [phone]   [facility] [address] [place], [place]  [phone]   [facility] [address],  [place], [place] [phone] or [phone]     Substance Abuse Resources Organization         Address     Phone             Notes  [facility]  [phone]   [facility]  [phone]   [facility] [place]  [phone]   Floydene Flock  [phone]   Residential & Outpatient Substance Abuse Program  [phone]   Psychological Services Organization         Address     Phone             Notes  [facility]  [phone]   [facility] Services  [phone]   [facility] [address], [place] [phone] or [phone]    Mobile Crisis Teams Organization         Address     Phone             Notes  [facility], Mobile Crisis Care Unit  [phone]   [facility]  [address] Gin[place], [place] [phone]   [facility] [address] [place] [phone]    Self-Help/Support Groups Organization         Address     Phone             Notes  [facility] [place] - variety of support groups  [phone]- [phone] Call for more information  Narcotics Anonymous (NA), [facility] [address], [employer] [place]  2 meetings at this location   Statistician         Address     Phone             Notes  [facility] [address],    [place]  [phone]   [facility]  [address] [place] [address], [place], [place] [phone]   [facility] [address] Luis M. [place], [place] [phone] Admissions: 8am-3pm M-F  [facility] [address]    [place], [place] [phone]   [facility] [address] [place], [place], [place] [phone]   [facility] [address]  [place], [place] [phone]   [facility] - Intensive Outpatient [address] Laurell Josephs [address], [place], [place] [phone]   [facility] ([facility] [address] [place].,  [place], [place] [phone] or [phone]   [facility] ([facility]) [address] [place], [place] [phone] Accepts Medicaid  [facility] [address],  [place] [phone] Substance Abuse/Addiction Treatment   [facility] Organization         Address     Phone             Notes  [facility]  [phone]   Angie Fava, PhD [address] Ervin Knack [place], [place]   [phone] or [phone]   [facility] Behavioral   [address] [place], [place] [phone]   [facility] [address] [place], [place] [phone] Insurance/Medicaid/sponsorship through [facility] and Families [address]                                    [place], [place] [phone] Therapy/tele-psych/case  [facility] [address], [place] [phone]    Dr. Lolly Mustache  [phone]   [facility] [place]  [employer] [facility] Dept. 1) [address] [place] 2) [address] [place] 3)  [address], [place] [phone]  [phone]   [facility] Child Abuse Hotline [phone])[phone] or [phone] (After Hours)

## 2014-06-22 NOTE — BH Assessment (Addendum)
Tele Assessment Note   [name] is an 53 y.o. male. Pt presents to [facility] with C/O medical clearance. Pt reports daily Crack/Cocaine use and reports that he has a problems. Pt made a joke that he smokes "crack out his ass", stating that is something someone told him in prison.Pt presents agitated and belligerent during TTS assessment. Pt cursing and speaks about someone "fucking" his wife" and becomes extremely agitated when speaking about her. Pt presents hostile and angry when speaking to assessor. Pt's mood is observed as labile and angry. Pt reports that he has a problem with using Crack/Cocaine. Prior history of THC and occasional etoh use noted from previous assessment. Pt reports that he came to the hospital to get "help", pt unable to specify what he needs help with after being prompted.  Pt states that he was willing to get some help until someone walked in his room smelling like perfume and that upset him and now he wants a bus pass to go home. Pt denies current SI,HI, and no AVH reported. Pt reports that he receives mental health outpatient services from [facility] to include medication management. Pt reports that he is suppose to be set up with [name] ACTT services but missed his appointment for ACTT services and to get started on his Abilify. Patient reports that he has an appt. with [employer].  Please see ED Provider notes for additional information on patient.  Consulted with AC Shalita and Claudette Head who will evaluate patient further to determine disposition.   Axis I: 295.90 Schizophrenia and Substance Induced Mood Disorder, Stimulant Use Disorder, Severe Axis II: Deferred Axis III:  Past Medical History  Diagnosis Date  . Diabetes mellitus without complication   . Hypertension   . Schizophrenia   . CHF (congestive heart failure)   . Neuropathy   . Polysubstance abuse   . Cocaine abuse   . Homelessness    Axis IV: economic problems, other psychosocial or  environmental problems, problems related to legal system/crime, problems related to social environment and problems with primary support group Axis V: 31-40 impairment in reality testing  Past Medical History:  Past Medical History  Diagnosis Date  . Diabetes mellitus without complication   . Hypertension   . Schizophrenia   . CHF (congestive heart failure)   . Neuropathy   . Polysubstance abuse   . Cocaine abuse   . Homelessness     History reviewed. No pertinent past surgical history.  Family History:  Family History  Problem Relation Age of Onset  . Hypertension Other   . Diabetes Other     Social History:  reports that he has been smoking Cigarettes.  He has a 20 pack-year smoking history. He uses smokeless tobacco. He reports that he drinks alcohol. He reports that he uses illicit drugs ("Crack" cocaine, Cocaine, and Marijuana) about 7 times per week.  Additional Social History:  Alcohol / Drug Use History of alcohol / drug use?: Yes Negative Consequences of Use: Legal Substance #1 Name of Substance 1:  (Cocaine) 1 - Age of First Use:  (25) 1 - Amount (size/oz):  (varies- 1gram or " a 16th") 1 - Frequency:  (Daily) 1 - Duration:  (on-going history) 1 - Last Use / Amount:  (06/22/14- $100 worth)  CIWA: CIWA-Ar BP: 159/80 mmHg Pulse Rate: 94 COWS:    PATIENT STRENGTHS: (choose at least two) Average or above average intelligence  Allergies:  Allergies  Allergen Reactions  . Haldol [Haloperidol] Other (See  Comments)    Muscle spasms, loss of voluntary movement. However, pt has taken Thorazine on multiple occasions with no adverse effects.     Home Medications:  (Not in a hospital admission)  OB/GYN Status:  No LMP for male patient.  General Assessment Data [facility] Is this a Tele or Face-to-Face Assessment?: Tele Assessment Is this an Initial Assessment or a Re-assessment for this encounter?: Initial Assessment Living Arrangements:  Other relatives Can pt return to current living arrangement?: Yes Admission Status: Voluntary Is patient capable of signing voluntary admission?: Yes Transfer from: Unknown Referral Source: Self/Family/Friend  Medical Screening Exam [facility] Walk-in ONLY) Medical Exam completed: No Reason for MSE not completed:  (N/A)  [facility] Living Arrangements: Other relatives Name of Psychiatrist: [name] Name of Therapist: [name]  Education Status Is patient currently in school?: No Current Grade: n/a Highest grade of school patient has completed: n/a Name of school: n/a Contact person: n/a  Risk to self with the past 6 months Suicidal Ideation: No (pt denies to this Clinical research associatewriter, ED provider reports otherwise) Suicidal Intent: No Is patient at risk for suicide?: Yes Suicidal Plan?: No Specify Current Suicidal Plan: no plan Access to Means: No Specify Access to Suicidal Means: na What has been your use of drugs/alcohol within the last 12 months?: daily crack use, prior hx of  THC and Etoh use reported from prior assessment Previous Attempts/Gestures: Yes How many times?:  (pt unable to specify at this time.) Other Self Harm Risks: agitation and stress d/t substance use and legal issues, no natural supports Triggers for Past Attempts: Unpredictable Intentional Self Injurious Behavior: None Comment - Self Injurious Behavior: na Family Suicide History: No Recent stressful life event(s): Legal Issues, Other (Comment) (Substance Abuse Addiction, no natural supports.) Persecutory voices/beliefs?: No Depression: No Depression Symptoms: Feeling worthless/self pity, Feeling angry/irritable Substance abuse history and/or treatment for substance abuse?: Yes Suicide prevention information given to non-admitted patients: Not applicable  Risk to Others within the past 6 months Homicidal Ideation: No Thoughts of Harm to Others: No Comment - Thoughts of Harm to Others: na Current Homicidal  Intent: No Current Homicidal Plan: No Describe Current Homicidal Plan: na Access to Homicidal Means: No Describe Access to Homicidal Means: na Identified Victim: denies HI History of harm to others?: No Assessment of Violence: None Noted Violent Behavior Description: Agitated, Beligerant during TTS assessment Does patient have access to weapons?: No Criminal Charges Pending?: Yes Describe Pending Criminal Charges: Misdemeanor charges for trespassing, "non Violently" resisting arrest of an officer Does patient have a court date: Yes Court Date:  (Court date in January 2016, pt unable to specify.)  Psychosis Hallucinations: None noted Delusions: None noted  Mental Status Report Appear/Hygiene: Bizarre, In scrubs Eye Contact: Fair Motor Activity: Agitation Speech: Aggressive Level of Consciousness: Alert, Irritable Mood: Labile, Angry Affect: Angry, Irritable Anxiety Level: Severe Thought Processes: Coherent, Relevant, Irrelevant, Circumstantial Judgement: Impaired Orientation: Person, Place, Situation Obsessive Compulsive Thoughts/Behaviors: Minimal  Cognitive Functioning Concentration: Decreased Memory: Recent Intact, Remote Intact IQ: Average Insight: Poor Impulse Control: Poor Appetite: Poor Weight Loss:  (ukn) Weight Gain:  (ukn) Sleep: Decreased Total Hours of Sleep:  (pt is unable to specify.) Vegetative Symptoms: None  ADLScreening [facility] Assessment Services) Patient's cognitive ability adequate to safely complete daily activities?: Yes Patient able to express need for assistance with ADLs?: Yes Independently performs ADLs?: Yes (appropriate for developmental age)  Prior Inpatient Therapy Prior Inpatient Therapy: Yes Prior Therapy Dates: multiple admits Prior Therapy  Facilty/Provider(s): [facility], [facility], and other facilities. Reason for Treatment: SI, Psychosis, SA  Prior Outpatient Therapy Prior Outpatient Therapy: Yes Prior Therapy Dates: Current  Provider Prior Therapy Facilty/Provider(s): [name] Reason for Treatment: ACTT services, Psychiatry  ADL Screening (condition at time of admission) Patient's cognitive ability adequate to safely complete daily activities?: Yes Is the patient deaf or have difficulty hearing?: No Does the patient have difficulty seeing, even when wearing glasses/contacts?: No Does the patient have difficulty concentrating, remembering, or making decisions?: No Patient able to express need for assistance with ADLs?: Yes Does the patient have difficulty dressing or bathing?: No Independently performs ADLs?: Yes (appropriate for developmental age) Does the patient have difficulty walking or climbing stairs?: No Weakness of Legs: None Weakness of Arms/Hands: None  Home Assistive Devices/Equipment Home Assistive Devices/Equipment: None    Abuse/Neglect Assessment (Assessment to be complete while patient is alone) Physical Abuse: Denies Verbal Abuse: Denies Sexual Abuse: Denies Exploitation of patient/patient's resources: Denies Self-Neglect: Denies     Merchant navy officerAdvance Directives (For Healthcare) Does patient have an advance directive?: No Would patient like information on creating an advanced directive?: No - patient declined information    Additional Information 1:1 In Past 12 Months?: No CIRT Risk: Yes (possible due to level of agitation.) Elopement Risk: Yes (Pt wants to leave ER.) Does patient have medical clearance?: Yes     Disposition:  Disposition Initial Assessment Completed for this Encounter: Yes Disposition of Patient: Other dispositions (pending psychiatric consult.) Patient referred to:  (pending psychiatric consult.)  Carry Weesner, Len BlalockNajah Sabreen Zada Haser, MS, LCASA Assessment Counselor  06/22/2014 7:52 AM

## 2014-06-22 NOTE — BH Assessment (Signed)
Spoke with ED nurse Denny PeonErin who will coordinate set-up for Tele-psych cart so TTS assessment can begin.   Glorious PeachNajah Martiza Speth, MS, LCASA Assessment Counselor

## 2014-06-22 NOTE — BH Assessment (Signed)
TTS consult called in at 0640 per shift report.   Glorious PeachNajah Jeimy Bickert, MS, LCASA Assessment Counselor

## 2014-06-22 NOTE — ED Notes (Signed)
Pt. Has a history of hypertension and cocaine abuse. Pt. Comes in with BP 200/80. HR 96, RR 16. CBG 157.

## 2014-06-22 NOTE — BH Assessment (Signed)
Pt was evaluated by TTS after shift report. Consult called in during previous shift.   Glorious PeachNajah Jourdin Gens, MS, LCASA Assessment Counselor

## 2014-06-22 NOTE — ED Provider Notes (Signed)
CSN: [id number]     Arrival date & time 06/22/14  0356 History   [id number] MD Initiated Contact with Patient 06/22/14 0555     Chief Complaint  Patient presents with  . Hypertension     (Consider location/radiation/quality/duration/timing/severity/associated sxs/prior Treatment) HPI [name] is a 53 y.o. male with a history of polysubstance abuse, alcohol abuse, hypertension, diabetes comes in for evaluation of high blood pressure. Patient states his blood pressure was "over 200, but is down now". He states that he needs to speak with mental health because "I'm in a bad place mentally, if you discharge me I am going to go overdose on crack cocaine and kill myself". Reports that he is sorry for abusing the system and really wants help tonight. Wants detox for cocaine use. Reports last time he had cocaine was last night, last time he had alcohol was 2 days ago. Reports he does not drink alcohol that often. He reports pain in his hands and feet and requests Tylenol for his pain. No other modifying factors or medical complaints.  Past Medical History  Diagnosis Date  . Diabetes mellitus without complication   . Hypertension   . Schizophrenia   . CHF (congestive heart failure)   . Neuropathy   . Polysubstance abuse   . Cocaine abuse   . Homelessness    History reviewed. No pertinent past surgical history. Family History  Problem Relation Age of Onset  . Hypertension Other   . Diabetes Other    History  Substance Use Topics  . Smoking status: Current Every Day Smoker -- 1.00 packs/day for 20 years    Types: Cigarettes  . Smokeless tobacco: Current User  . Alcohol Use: Yes     Comment: 40oz on 04/01/14    Review of Systems  Musculoskeletal: Positive for myalgias.  Psychiatric/Behavioral: Positive for suicidal ideas.  All other systems reviewed and are negative.     Allergies  Haldol  Home Medications   Prior to Admission medications   Medication Sig Start Date End  Date Taking? Authorizing Provider  ARIPiprazole (ABILIFY) 5 MG tablet Take 1 tablet (5 mg total) by mouth 2 (two) times daily. 06/04/14   Beau FannyJohn C Withrow, FNP  benztropine (COGENTIN) 2 MG tablet Take 1 tablet (2 mg total) by mouth 2 (two) times daily. 06/04/14   Beau FannyJohn C Withrow, FNP  cloNIDine (CATAPRES) 0.2 MG tablet Take 0.2 mg by mouth 2 (two) times daily.    Historical Provider, MD  fluPHENAZine decanoate (PROLIXIN) 25 MG/ML injection Inject 1 mL (25 mg total) into the muscle every 14 (fourteen) days. 06/04/14   Beau FannyJohn C Withrow, FNP  hydrochlorothiazide (HYDRODIURIL) 25 MG tablet Take 1 tablet (25 mg total) by mouth daily. 06/04/14   Everardo AllJohn C Withrow, FNP   BP 137/80 mmHg  Pulse 77  Temp(Src) 98.4 F (36.9 C) (Axillary)  Resp 21  SpO2 100% Physical Exam  Constitutional: He is oriented to person, place, and time. He appears well-developed and well-nourished.  HENT:  Head: Normocephalic and atraumatic.  Mouth/Throat: Oropharynx is clear and moist.  Poor dentition  Eyes: Conjunctivae are normal. Pupils are equal, round, and reactive to light. Right eye exhibits no discharge. Left eye exhibits no discharge. No scleral icterus.  Neck: Normal range of motion. Neck supple.  Cardiovascular: Normal rate, regular rhythm and normal heart sounds.   Pulmonary/Chest: Effort normal and breath sounds normal. No respiratory distress. He has no wheezes. He has no rales.  Abdominal: Soft. He exhibits no  distension. There is no tenderness.  Musculoskeletal: He exhibits no tenderness.  Neurological: He is alert and oriented to person, place, and time.  Cranial Nerves II-XII grossly intact  Skin: Skin is warm. No rash noted. He is diaphoretic.  Psychiatric: He has a normal mood and affect.  Very anxious with pressured speech.  Nursing note and vitals reviewed.   ED Course  Procedures (including critical care time) Labs Review Labs Reviewed  ACETAMINOPHEN LEVEL - Abnormal; Notable for the following:     Acetaminophen (Tylenol), Serum <10.0 (*)    All other components within normal limits  CBC - Abnormal; Notable for the following:    RBC 3.65 (*)    Hemoglobin 10.5 (*)    HCT 33.1 (*)    All other components within normal limits  COMPREHENSIVE METABOLIC PANEL - Abnormal; Notable for the following:    Glucose, Bld 175 (*)    Total Protein 5.9 (*)    All other components within normal limits  URINE RAPID DRUG SCREEN (HOSP PERFORMED) - Abnormal; Notable for the following:    Cocaine POSITIVE (*)    All other components within normal limits  ETHANOL  SALICYLATE LEVEL    Imaging Review No results found.   EKG Interpretation None      MDM  [name] is a 53 y.o. male history of cocaine and alcohol abuse comes in for evaluation of high blood pressure. Blood pressure in the ED is not concerning for emergent pathology. Patient reports suicidal ideas with a plan of suicide by crack cocaine overdose. Requests detox from cocaine use. Requests Tylenol for foot and hand pain.  Labwork noncontributory. Medically cleared for TTS evaluation. Will consult TTS, They reports pt is at baseline and is appropriate to dc and follow up with his PCP. DC with outpt detox resources. Final diagnoses:  Substance abuse  Schizophrenia, unspecified type        Sharlene MottsBenjamin W Shyan Scalisi, PA-C 06/22/14 2052  April K Palumbo-Rasch, MD 06/23/14 [phone]

## 2014-06-22 NOTE — BH Assessment (Signed)
Per Renata Capriceonrad, NP - patient is at base line and does not meet inpatient criteria and can be discharged to follow up with his current provider.

## 2014-06-22 NOTE — BH Assessment (Signed)
Spoke with ED provider Kendell BaneBenjamin Cartner,PA to obtain clinicals prior to assessing patient.   Glorious PeachNajah Makenleigh Crownover, MS, LCASA Assessment Counselor

## 2014-06-22 NOTE — BH Assessment (Signed)
Spoke with ED nurse regarding request for UDS specimen which has not been collected at this time. ED nurse reports that patient was just transferred from POD A to Pod C and [name], RN is working with patient now to obtain specimen.   Taylor [name], MS, LCASA Assessment Counselor

## 2014-06-22 NOTE — ED Provider Notes (Addendum)
Filed Vitals:   06/22/14 0633  BP: 159/80  Pulse: 94  Temp: 97.2 F (36.2 C)  Resp: 22    Results for orders placed or performed during the hospital encounter of 06/22/14  Acetaminophen level  Result Value Ref Range   Acetaminophen (Tylenol), Serum <10.0 (L) 10 - 30 ug/mL  CBC  Result Value Ref Range   WBC 6.8 4.0 - 10.5 K/uL   RBC 3.65 (L) 4.22 - 5.81 MIL/uL   Hemoglobin 10.5 (L) 13.0 - 17.0 g/dL   HCT 78.233.1 (L) 95.639.0 - 21.352.0 %   MCV 90.7 78.0 - 100.0 fL   MCH 28.8 26.0 - 34.0 pg   MCHC 31.7 30.0 - 36.0 g/dL   RDW 08.613.3 57.811.5 - 46.915.5 %   Platelets 223 150 - 400 K/uL  Comprehensive metabolic panel  Result Value Ref Range   Sodium 139 135 - 145 mmol/L   Potassium 3.5 3.5 - 5.1 mmol/L   Chloride 106 96 - 112 mEq/L   CO2 26 19 - 32 mmol/L   Glucose, Bld 175 (H) 70 - 99 mg/dL   BUN 15 6 - 23 mg/dL   Creatinine, Ser 6.290.98 0.50 - 1.35 mg/dL   Calcium 9.0 8.4 - 52.810.5 mg/dL   Total Protein 5.9 (L) 6.0 - 8.3 g/dL   Albumin 3.6 3.5 - 5.2 g/dL   AST 24 0 - 37 U/L   ALT 11 0 - 53 U/L   Alkaline Phosphatase 77 39 - 117 U/L   Total Bilirubin 0.4 0.3 - 1.2 mg/dL   GFR calc non Af Amer >90 >90 mL/min   GFR calc Af Amer >90 >90 mL/min   Anion gap 7 5 - 15  Ethanol (ETOH)  Result Value Ref Range   Alcohol, Ethyl (B) <5 0 - 9 mg/dL  Salicylate level  Result Value Ref Range   Salicylate Lvl <4.0 2.8 - 20.0 mg/dL   No results found.   Pt very anxious.  Has been doing a lot of cocaine.  Given ativan 2mg  IM.  Awaiting psych assessment  15:00 pt assessed by psych, okay to discharge.  At baseline mental status now.  Eating normally, ambulating.  Will d/c with outpt resources for f/u. Rolan BuccoMelanie Chandlar Staebell, MD 06/22/14 [phone]  Rolan BuccoMelanie Delton Stelle, MD 06/22/14 [phone]

## 2014-06-24 ENCOUNTER — Emergency Department (HOSPITAL_COMMUNITY)
Admission: EM | Admit: 2014-06-24 | Discharge: 2014-06-24 | Disposition: A | Discharge status: Home / Self Care | Payer: Medicare Other | Attending: Emergency Medicine | Admitting: Emergency Medicine

## 2014-06-24 ENCOUNTER — Encounter (HOSPITAL_COMMUNITY): Payer: Self-pay | Admitting: Emergency Medicine

## 2014-06-24 ENCOUNTER — Emergency Department (HOSPITAL_COMMUNITY)
Admission: EM | Admit: 2014-06-24 | Discharge: 2014-06-24 | Disposition: A | Discharge status: Home / Self Care | Payer: Medicare Other | Source: Home / Self Care | Attending: Emergency Medicine | Admitting: Emergency Medicine

## 2014-06-24 DIAGNOSIS — I509 Heart failure, unspecified: Secondary | ICD-10-CM

## 2014-06-24 DIAGNOSIS — Z8669 Personal history of other diseases of the nervous system and sense organs: Secondary | ICD-10-CM | POA: Insufficient documentation

## 2014-06-24 DIAGNOSIS — M79672 Pain in left foot: Secondary | ICD-10-CM | POA: Insufficient documentation

## 2014-06-24 DIAGNOSIS — M79671 Pain in right foot: Secondary | ICD-10-CM | POA: Insufficient documentation

## 2014-06-24 DIAGNOSIS — Z5900 Homelessness unspecified: Secondary | ICD-10-CM

## 2014-06-24 DIAGNOSIS — E119 Type 2 diabetes mellitus without complications: Secondary | ICD-10-CM

## 2014-06-24 DIAGNOSIS — Z79899 Other long term (current) drug therapy: Secondary | ICD-10-CM | POA: Insufficient documentation

## 2014-06-24 DIAGNOSIS — F209 Schizophrenia, unspecified: Secondary | ICD-10-CM | POA: Insufficient documentation

## 2014-06-24 DIAGNOSIS — Z720 Tobacco use: Secondary | ICD-10-CM

## 2014-06-24 DIAGNOSIS — I10 Essential (primary) hypertension: Secondary | ICD-10-CM | POA: Insufficient documentation
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
## 2014-06-24 MED ORDER — ACETAMINOPHEN 325 MG PO TABS
650.0000 mg | ORAL_TABLET | Freq: Once | ORAL | Status: AC
  Administered 2014-06-24: 650 mg via ORAL
  Filled 2014-06-24: qty 2

## 2014-06-24 NOTE — ED Notes (Signed)
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 I either spend it on prostitutes or drugs." I asked him what his drug of choice was and he stated "street drugs, not the crap these youngins are doing these days, real drugs, speed, herorin, crack, cocaine whatever I can get my hands on" Pt then starts yelling at me to bring him crackers and juice.

## 2014-06-24 NOTE — ED Provider Notes (Signed)
CSN: [id number]     Arrival date & time 06/24/14  2034 History   First MD Initiated Contact with Patient 06/24/14 2046     Chief Complaint  Patient presents with  . Foot Pain     (Consider location/radiation/quality/duration/timing/severity/associated sxs/prior Treatment) HPI Comments: She presents to the emergency department again complaining of bilateral foot pain from walking a lot.  He is asking that his feet can be soaked.  This helps him feel better Patient has extensive psychiatric history and this is a frequent complaint  Patient is a 53 y.o. male presenting with lower extremity pain. The history is provided by the patient.  Foot Pain This is a chronic problem. The problem occurs constantly. The problem has been unchanged. Pertinent negatives include no fever. The symptoms are aggravated by walking. He has tried nothing for the symptoms. The treatment provided no relief.    Past Medical History  Diagnosis Date  . Diabetes mellitus without complication   . Hypertension   . Schizophrenia   . CHF (congestive heart failure)   . Neuropathy   . Polysubstance abuse   . Cocaine abuse   . Homelessness    History reviewed. No pertinent past surgical history. Family History  Problem Relation Age of Onset  . Hypertension Other   . Diabetes Other    History  Substance Use Topics  . Smoking status: Current Every Day Smoker -- 1.00 packs/day for 20 years    Types: Cigarettes  . Smokeless tobacco: Current User  . Alcohol Use: Yes     Comment: 40oz on 04/01/14    Review of Systems  Constitutional: Negative for fever.  Skin: Negative for wound.  All other systems reviewed and are negative.     Allergies  Haldol  Home Medications   Prior to Admission medications   Medication Sig Start Date End Date Taking? Authorizing Provider  ARIPiprazole (ABILIFY) 5 MG tablet Take 1 tablet (5 mg total) by mouth 2 (two) times daily. 06/04/14  Yes Beau FannyJohn C Withrow, FNP  benztropine  (COGENTIN) 2 MG tablet Take 1 tablet (2 mg total) by mouth 2 (two) times daily. 06/04/14  Yes Beau FannyJohn C Withrow, FNP  cloNIDine (CATAPRES) 0.2 MG tablet Take 0.2 mg by mouth 2 (two) times daily.   Yes Historical Provider, MD  hydrochlorothiazide (HYDRODIURIL) 25 MG tablet Take 1 tablet (25 mg total) by mouth daily. 06/04/14  Yes Beau FannyJohn C Withrow, FNP  fluPHENAZine decanoate (PROLIXIN) 25 MG/ML injection Inject 1 mL (25 mg total) into the muscle every 14 (fourteen) days. Patient not taking: Reported on 06/24/2014 06/04/14   Beau FannyJohn C Withrow, FNP   There were no vitals taken for this visit. Physical Exam  Constitutional: He appears well-developed and well-nourished.  Odiferous and ill kept  HENT:  Head: Normocephalic.  Eyes: Pupils are equal, round, and reactive to light.  Neck: Normal range of motion.  Cardiovascular: Normal rate.   Musculoskeletal: Normal range of motion.  Neurological: He is alert.  Skin: Skin is warm. No rash noted. No erythema.  Nursing note and vitals reviewed.   ED Course  Procedures (including critical care time) Labs Review Labs Reviewed - No data to display  Imaging Review No results found.   EKG Interpretation None      MDM  In soaked his feet.  He was given the meal of his choice, Tylenol and discharged home Final diagnoses:  Homeless  Foot pain, bilateral         Arman FilterGail K Malorie Bigford, NP  06/24/14 2208  Mirian MoMatthew Gentry, MD 06/27/14 [phone]

## 2014-06-24 NOTE — ED Notes (Signed)
Patient here with complaint of bilateral foot pain. Multiple previous ED presentation for the same. No acute injury reported.

## 2014-06-24 NOTE — ED Notes (Signed)
Dondra SpryGail. NP at the bedside.

## 2014-06-24 NOTE — Discharge Instructions (Signed)
Try to wear clean sock and wash your feet on a daily basis

## 2014-06-24 NOTE — Discharge Instructions (Signed)
Followup with your doctor as needed

## 2014-06-24 NOTE — ED Provider Notes (Signed)
CSN: [id number]     Arrival date & time 06/24/14  [id number] History   First MD Initiated Contact with Patient 06/24/14 [phone]     Chief Complaint  Patient presents with  . Foot Pain     (Consider location/radiation/quality/duration/timing/severity/associated sxs/prior Treatment) HPI Comments: Patient here with bilateral foot pain and swelling that occurred after he walked with bad shoes. He is also had discoloration to his feet due to the dye in the shoes. Denies any numbness or tingling. Patient has had multiple visits here in the past 6 months for various complaints including multiple psychiatric admissions. Patient is a question of breakfast tray, milk, crackers and Tylenol. No other complaints noted. No treatment use prior to arrival.  Patient is a 53 y.o. male presenting with lower extremity pain. The history is provided by the patient.  Foot Pain    Past Medical History  Diagnosis Date  . Diabetes mellitus without complication   . Hypertension   . Schizophrenia   . CHF (congestive heart failure)   . Neuropathy   . Polysubstance abuse   . Cocaine abuse   . Homelessness    History reviewed. No pertinent past surgical history. Family History  Problem Relation Age of Onset  . Hypertension Other   . Diabetes Other    History  Substance Use Topics  . Smoking status: Current Every Day Smoker -- 1.00 packs/day for 20 years    Types: Cigarettes  . Smokeless tobacco: Current User  . Alcohol Use: Yes     Comment: 40oz on 04/01/14    Review of Systems  All other systems reviewed and are negative.     Allergies  Haldol  Home Medications   Prior to Admission medications   Medication Sig Start Date End Date Taking? Authorizing Provider  ARIPiprazole (ABILIFY) 5 MG tablet Take 1 tablet (5 mg total) by mouth 2 (two) times daily. 06/04/14  Yes Beau FannyJohn C Withrow, FNP  benztropine (COGENTIN) 2 MG tablet Take 1 tablet (2 mg total) by mouth 2 (two) times daily. 06/04/14  Yes Beau FannyJohn C  Withrow, FNP  cloNIDine (CATAPRES) 0.2 MG tablet Take 0.2 mg by mouth 2 (two) times daily.   Yes Historical Provider, MD  hydrochlorothiazide (HYDRODIURIL) 25 MG tablet Take 1 tablet (25 mg total) by mouth daily. 06/04/14  Yes Beau FannyJohn C Withrow, FNP  fluPHENAZine decanoate (PROLIXIN) 25 MG/ML injection Inject 1 mL (25 mg total) into the muscle every 14 (fourteen) days. 06/04/14   Everardo AllJohn C Withrow, FNP   BP 175/97 mmHg  Pulse 81  Temp(Src) 98.7 F (37.1 C) (Oral)  Resp 16  SpO2 99% Physical Exam  Constitutional: He is oriented to person, place, and time. He appears well-developed and well-nourished.  Non-toxic appearance. No distress.  HENT:  Head: Normocephalic and atraumatic.  Eyes: Conjunctivae, EOM and lids are normal. Pupils are equal, round, and reactive to light.  Neck: Normal range of motion. Neck supple. No tracheal deviation present. No thyroid mass present.  Cardiovascular: Normal rate, regular rhythm and normal heart sounds.  Exam reveals no gallop.   No murmur heard. Pulmonary/Chest: Effort normal and breath sounds normal. No stridor. No respiratory distress. He has no decreased breath sounds. He has no wheezes. He has no rhonchi. He has no rales.  Abdominal: Soft. Normal appearance and bowel sounds are normal. He exhibits no distension. There is no tenderness. There is no rebound and no CVA tenderness.  Musculoskeletal: Normal range of motion. He exhibits no edema or tenderness.  Bilateral  feet. Dorsalis pedis pulses. No erythema noted. Some dark discoloration due to for material. No evidence of vascular compromise     Neurological: He is alert and oriented to person, place, and time. He has normal strength. No cranial nerve deficit or sensory deficit. GCS eye subscore is 4. GCS verbal subscore is 5. GCS motor subscore is 6.  Skin: Skin is warm and dry. No abrasion and no rash noted.  Psychiatric: He has a normal mood and affect. His speech is normal and behavior is normal.  Nursing  note and vitals reviewed.   ED Course  Procedures (including critical care time) Labs Review Labs Reviewed - No data to display  Imaging Review No results found.   EKG Interpretation None      MDM   Final diagnoses:  None    Patient will be given local wound care here in follow-up with his Dr.    Toy BakerAnthony T Alban Marucci, MD 06/24/14 [phone]

## 2014-06-24 NOTE — ED Notes (Signed)
Pt reported bil foot pain and swelling. Denies injury/trauma. (+)PMS. No deformity noted. No visible swelling noted at this time. Noted toenails are black on all toes. Pt requesting breakfast tray, milk, crackers, and Tylenol for pain.

## 2014-06-24 NOTE — ED Notes (Signed)
Pt arrived to the ED with a complaint of bilateral foot pain.  Pt states that his feet have been hurting him and that his toenails have gone black.  Pt states that he doesn't know where his shies are or when he lost them as he "has been smoking."  Pt states he has not been compliant with his medications.
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
## 2014-07-06 ENCOUNTER — Encounter (HOSPITAL_COMMUNITY): Payer: Self-pay | Admitting: Emergency Medicine

## 2014-07-06 ENCOUNTER — Emergency Department (HOSPITAL_COMMUNITY)
Admission: EM | Admit: 2014-07-06 | Discharge: 2014-07-06 | Disposition: A | Discharge status: Home / Self Care | Payer: Medicare Other | Attending: Emergency Medicine | Admitting: Emergency Medicine

## 2014-07-06 DIAGNOSIS — F1490 Cocaine use, unspecified, uncomplicated: Secondary | ICD-10-CM

## 2014-07-06 DIAGNOSIS — I10 Essential (primary) hypertension: Secondary | ICD-10-CM | POA: Insufficient documentation

## 2014-07-06 DIAGNOSIS — Z590 Homelessness: Secondary | ICD-10-CM | POA: Insufficient documentation

## 2014-07-06 DIAGNOSIS — F1420 Cocaine dependence, uncomplicated: Secondary | ICD-10-CM | POA: Insufficient documentation

## 2014-07-06 DIAGNOSIS — Z8669 Personal history of other diseases of the nervous system and sense organs: Secondary | ICD-10-CM | POA: Insufficient documentation

## 2014-07-06 DIAGNOSIS — F209 Schizophrenia, unspecified: Secondary | ICD-10-CM | POA: Insufficient documentation

## 2014-07-06 DIAGNOSIS — Z79899 Other long term (current) drug therapy: Secondary | ICD-10-CM | POA: Insufficient documentation

## 2014-07-06 DIAGNOSIS — Z720 Tobacco use: Secondary | ICD-10-CM | POA: Insufficient documentation

## 2014-07-06 DIAGNOSIS — E119 Type 2 diabetes mellitus without complications: Secondary | ICD-10-CM | POA: Insufficient documentation

## 2014-07-06 DIAGNOSIS — I509 Heart failure, unspecified: Secondary | ICD-10-CM | POA: Insufficient documentation

## 2014-07-06 LAB — CBC
HCT: 41.6 % (ref 39.0–52.0)
Hemoglobin: 13.2 g/dL (ref 13.0–17.0)
MCH: 29 pg (ref 26.0–34.0)
MCHC: 31.7 g/dL (ref 30.0–36.0)
MCV: 91.4 fL (ref 78.0–100.0)
Platelets: 225 10*3/uL (ref 150–400)
RBC: 4.55 MIL/uL (ref 4.22–5.81)
RDW: 13 % (ref 11.5–15.5)
WBC: 6.5 10*3/uL (ref 4.0–10.5)

## 2014-07-06 LAB — COMPREHENSIVE METABOLIC PANEL
ALK PHOS: 92 U/L (ref 39–117)
ALT: 13 U/L (ref 0–53)
AST: 28 U/L (ref 0–37)
Albumin: 4.6 g/dL (ref 3.5–5.2)
Anion gap: 7 (ref 5–15)
BUN: 13 mg/dL (ref 6–23)
CALCIUM: 9.7 mg/dL (ref 8.4–10.5)
CHLORIDE: 102 meq/L (ref 96–112)
CO2: 30 mmol/L (ref 19–32)
Creatinine, Ser: 0.81 mg/dL (ref 0.50–1.35)
GFR calc Af Amer: >90 mL/min (ref >90)
GFR calc non Af Amer: >90 mL/min (ref >90)
GLUCOSE: 87 mg/dL (ref 70–99)
POTASSIUM: 3.3 mmol/L — AB (ref 3.5–5.1)
Sodium: 139 mmol/L (ref 135–145)
TOTAL PROTEIN: 7.8 g/dL (ref 6.0–8.3)
Total Bilirubin: 1.1 mg/dL (ref 0.3–1.2)

## 2014-07-06 LAB — RAPID URINE DRUG SCREEN, HOSP PERFORMED
AMPHETAMINES: NOT DETECTED
Barbiturates: NOT DETECTED
Benzodiazepines: NOT DETECTED
COCAINE: POSITIVE — AB
OPIATES: NOT DETECTED
Tetrahydrocannabinol: NOT DETECTED

## 2014-07-06 LAB — ETHANOL: Alcohol, Ethyl (B): <5 mg/dL (ref 0–9)

## 2014-07-06 LAB — ACETAMINOPHEN LEVEL

## 2014-07-06 LAB — SALICYLATE LEVEL

## 2014-07-06 MED ORDER — ONDANSETRON HCL 4 MG PO TABS: 4.0000 mg | ORAL_TABLET | Freq: Three times a day (TID) | ORAL | Status: DC | PRN

## 2014-07-06 MED ORDER — BENZTROPINE MESYLATE 1 MG PO TABS
1.0000 mg | ORAL_TABLET | Freq: Every day | ORAL | Status: DC
  Administered 2014-07-06: 1 mg via ORAL
  Filled 2014-07-06: qty 1

## 2014-07-06 MED ORDER — ACETAMINOPHEN 325 MG PO TABS
650.0000 mg | ORAL_TABLET | ORAL | Status: DC | PRN
  Administered 2014-07-06 (×2): 650 mg via ORAL
  Filled 2014-07-06 (×2): qty 2

## 2014-07-06 MED ORDER — ARIPIPRAZOLE 5 MG PO TABS
5.0000 mg | ORAL_TABLET | Freq: Every day | ORAL | Status: DC
  Administered 2014-07-06: 5 mg via ORAL
  Filled 2014-07-06: qty 1

## 2014-07-06 MED ORDER — ARIPIPRAZOLE 5 MG PO TABS: 5.0000 mg | ORAL_TABLET | Freq: Every day | ORAL | Status: DC

## 2014-07-06 NOTE — ED Notes (Signed)
During discharge pt denied pain. A few minutes later he requested tylenol. He then reported back pain. Tylenol given at d/c. All items returned. He denies si and hi.

## 2014-07-06 NOTE — ED Notes (Addendum)
Patient came to the unit being loud and disrupted. Patient cursing and yelling at security. Patient refusing to walk to his room. Patient eventually goes to his room and slammed door and laid down on stretcher.

## 2014-07-06 NOTE — ED Notes (Signed)
Personal belongings recorded on inventory sheet.

## 2014-07-06 NOTE — Discharge Instructions (Signed)
For your ongoing mental health needs you are advised to follow up with [name].  To begin or resume treatment with them you will need to go through their walk-in clinic.  Walk-in hours are Monday - Friday from 8:00 am - 3:00 pm.  Patients are seen on a first come, first served basis, so try to arrive as early as possible for the best chance of being seen the same day:       [name]      [address]      [place], [place] [address]      [phone]  To help you maintain a sober lifestyle, a substance abuse treatment program may be helpful to you.  [facility] provides this treatment.  Contact them at your earliest opportunity to get started in their program:       [facility]      [address]      [place], [place] [address]      [phone]

## 2014-07-06 NOTE — BH Assessment (Signed)
Writer informed TTS Berna SpareMarcus and Tobi Bastosnna of the consult.

## 2014-07-06 NOTE — [facility] Suicide Risk Assessment (Cosign Needed)
Suicide Risk Assessment  Discharge Assessment     Demographic Factors:  Low socioeconomic status, Living alone and Unemployed  Total Time spent with patient: 30 minutes  Psychiatric Specialty Exam:     Blood pressure 134/70, pulse 89, temperature 99.2 F (37.3 C), temperature source Oral, resp. rate 18, SpO2 99 %.There is no weight on file to calculate BMI.  General Appearance: Casual  Eye Contact::  Good  Speech:  Clear and Coherent  Volume:  Normal  Mood:  Euthymic  Affect:  Congruent  Thought Process:  Coherent, Goal Directed and Intact  Orientation:  Full (Time, Place, and Person)  Thought Content:  WDL  Suicidal Thoughts:  No  Homicidal Thoughts:  No  Memory:  Immediate;   Good Recent;   Good Remote;   Good  Judgement:  Poor  Insight:  Good  Psychomotor Activity:  Normal  Concentration:  Good  Recall:  NA  Fund of Knowledge:Good  Language: Good  Akathisia:  NA  Handed:  Right  AIMS (if indicated):     Assets:  Desire for Improvement Housing  Sleep:       Musculoskeletal: Strength & Muscle Tone: within normal limits Gait & Station: normal Patient leans: N/A   Mental Status Per Nursing Assessment::   On Admission:     Current Mental Status by Physician: NA  Loss Factors: NA  Historical Factors: NA  Risk Reduction Factors:   Positive social support  Continued Clinical Symptoms:  Schizophrenia:   Depressive state  Cognitive Features That Contribute To Risk:  Polarized thinking    Suicide Risk:  Minimal: No identifiable suicidal ideation.  Patients presenting with no risk factors but with morbid ruminations; may be classified as minimal risk based on the severity of the depressive symptoms  Discharge Diagnoses:  Schizophrenia, Chronic and Unspecified  type    Past Medical History  Diagnosis Date  . Diabetes mellitus without complication   . Hypertension   . Schizophrenia   . CHF (congestive heart failure)   . Neuropathy   .  Polysubstance abuse   . Cocaine abuse   . Homelessness      Plan Of Care/Follow-up recommendations:  Activity:  as tolerated Diet:  Regular  Is patient on multiple antipsychotic therapies at discharge:  Yes,   Do you recommend tapering to monotherapy for antipsychotics?  No   Has Patient had three or more failed trials of antipsychotic monotherapy by history:  No  Recommended Plan for Multiple Antipsychotic Therapies: NA    Margrett Kalb, C   PMHNP-BC 07/06/2014, 1:24 PM

## 2014-07-06 NOTE — BH Assessment (Addendum)
Tele Assessment Note   Taylor DouglasFredrick L Morrone is an 54 y.o. African-American single male who [facility] complaints of suicidal and homicidal ideation. Pt presents with labile mood and affect, alternating from irritable to depressed to angry again. Pt's speech is rapid and tangential. Thought process is often irrelevant and exhibits delusional thinking, as pt is very paranoid and reports that everyone is out to kill him. 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 Pt could not be redirected back to relevant content and became irritable whenever this writer attempted to simply reflect back what he was saying to her. Pt stated that "we're all demons", that he is a veteran despite never having served in [employer], and that everyone is jealous of him. Pt says he needs help getting back on his psychiatric medications but is unsure how long he has been off of them. Pt was fixated on his persecutory delusions throughout the assessment, stating that people are freezing him to death, putting voo doo on him, raping and beating him, etc. He states that he is not safe anywhere and that security at the hospital cannot stop people from harming him. Pt reports being a crack "junkie" but did not disclose the last time he used. Pt also has a hx of etoh and THC abuse. He has had multiple psychiatric hospitalizations, estimating 50 in his lifetime. Per pt chart, pt was most recently [facility] twice in Nov 2015, once in June 2015, and once in April 2015. His reports of abuse appear to be due to persecutory delusions, so it is unclear if pt has ever suffered actual abuse or not. Pt reports no access to guns or weapons but repeatedly told counselor that he was experiencing suicidal and homicidal ideation, making a  gesture as if he was choking someone and slapping someone during the interview. Pt reports that he receives ACTT services from [facility].  295.90 Schizophrenia 304.20 Cocaine use disorder, Moderate Axis II: No diagnosis Axis III:  Past Medical History  Diagnosis Date  . Diabetes mellitus without complication   . Hypertension   . Schizophrenia   . CHF (congestive heart failure)   . Neuropathy   . Polysubstance abuse   . Cocaine abuse   . Homelessness    Axis IV: economic problems, housing problems, occupational problems, other psychosocial or environmental problems, problems related to legal system/crime, problems related to social environment and problems with primary support group Axis V: 21-30 behavior considerably influenced by delusions or hallucinations OR serious impairment in judgment, communication OR inability to function in almost all areas  Past Medical History:  Past Medical History  Diagnosis Date  . Diabetes mellitus without complication   . Hypertension   . Schizophrenia   . CHF (congestive heart failure)   . Neuropathy   . Polysubstance abuse   . Cocaine abuse   . Homelessness     History reviewed. No pertinent past surgical history.  Family History:  Family History  Problem Relation Age of Onset  . Hypertension Other   . Diabetes Other     Social History:  reports that he has been smoking Cigarettes.  He has a 20 pack-year smoking history. He uses smokeless tobacco. He reports that he drinks alcohol. He reports that he uses  illicit drugs ("Crack" cocaine, Cocaine, and Marijuana) about 7 times per week.  Additional Social History:     CIWA: CIWA-Ar BP: 169/89 mmHg Pulse Rate: 85 COWS:    PATIENT STRENGTHS: (choose at least two) Average or above average intelligence Motivation for treatment/growth  Allergies:  Allergies  Allergen Reactions  . Haldol [Haloperidol] Other (See Comments)    Muscle spasms, loss of voluntary movement. However, pt has  taken Thorazine on multiple occasions with no adverse effects.     Home Medications:  (Not in a hospital admission)  OB/GYN Status:  No LMP for male patient.  General Assessment Data Location of Assessment: [facility] ED Is this a Tele or Face-to-Face Assessment?: Face-to-Face Is this an Initial Assessment or a Re-assessment for this encounter?: Initial Assessment Living Arrangements: Alone Can pt return to current living arrangement?: Yes Admission Status: Voluntary Is patient capable of signing voluntary admission?: Yes Transfer from: Unknown Referral Source: Self/Family/Friend     [facility] Crisis Care Plan Living Arrangements: Alone Name of Psychiatrist: [name] Name of Therapist: [name]  Education Status Is patient currently in school?: No Current Grade: na Highest grade of school patient has completed: na Name of school: na Contact person: na  Risk to self with the past 6 months Suicidal Ideation: Yes-Currently Present Suicidal Intent: No Is patient at risk for suicide?: Yes Suicidal Plan?: No Specify Current Suicidal Plan: UTA Access to Means: No Specify Access to Suicidal Means: na What has been your use of drugs/alcohol within the last 12 months?: Daily crack use; Hx of etoh and THC use Previous Attempts/Gestures: Yes How many times?: 5 Other Self Harm Risks: None known Triggers for Past Attempts: Unpredictable Intentional Self Injurious Behavior: None Comment - Self Injurious Behavior: na Family Suicide History: Unknown Recent stressful life event(s): Conflict (Comment), Other (Comment) (Persecutory delusions that everyone is out to harm him) Persecutory voices/beliefs?: Yes Depression: Yes Depression Symptoms: Despondent, Fatigue, Feeling angry/irritable Substance abuse history and/or treatment for substance abuse?: Yes Suicide prevention information given to non-admitted patients: Not applicable  Risk to Others within the past 6 months Homicidal Ideation:  Yes-Currently Present Thoughts of Harm to Others: Yes-Currently Present Comment - Thoughts of Harm to Others: Pt did not disclose Current Homicidal Intent: No Current Homicidal Plan: No (Pt did not disclose) Describe Current Homicidal Plan: Unknown Access to Homicidal Means: No Describe Access to Homicidal Means: Says he cannot gain access to a gun Identified Victim: Family members, strangers in the communnity History of harm to others?:  (Unknown) Assessment of Violence: None Noted Violent Behavior Description: Agitation, Says he wants to kill someone or himself Does patient have access to weapons?: No Criminal Charges Pending?: Yes Describe Pending Criminal Charges: Unknown Does patient have a court date: Yes Court Date:  (Unknown)  Psychosis Hallucinations: Auditory Delusions: Grandiose, Persecutory  Mental Status Report Appear/Hygiene: Poor hygiene Eye Contact: Fair Motor Activity: Agitation Speech: Aggressive, Rapid, Tangential Level of Consciousness: Alert Mood: Depressed, Labile, Angry Affect: Irritable Anxiety Level: Moderate Thought Processes: Tangential Judgement: Impaired Orientation: Unable to assess Obsessive Compulsive Thoughts/Behaviors: Moderate  Cognitive Functioning Concentration: Decreased Memory: Unable to Assess IQ: Average Insight: Poor Impulse Control: Poor Appetite: Poor Weight Loss: 0 Weight Gain: 0 Sleep: Decreased Total Hours of Sleep:  (Unknown) Vegetative Symptoms: Unable to Assess  ADLScreening [facility] Assessment Services) Patient's cognitive ability adequate to safely complete daily activities?: Yes Patient able to express need for assistance with ADLs?: Yes Independently performs ADLs?: Yes (appropriate for developmental age)  Prior Inpatient Therapy Prior Inpatient  Therapy: Yes Prior Therapy Dates: Multiple admissions Prior Therapy Facilty/Provider(s): [facility], [facility], others Reason for Treatment: SI/HI, psychosis  Prior  Outpatient Therapy Prior Outpatient Therapy: Yes Prior Therapy Dates: Current Prior Therapy Facilty/Provider(s): [name] Reason for Treatment: ACTT services  ADL Screening (condition at time of admission) Patient's cognitive ability adequate to safely complete daily activities?: Yes Patient able to express need for assistance with ADLs?: Yes Independently performs ADLs?: Yes (appropriate for developmental age)             Advance Directives (For Healthcare) Does patient have an advance directive?: No Would patient like information on creating an advanced directive?: No - patient declined information    Additional Information 1:1 In Past 12 Months?: No CIRT Risk: Yes (Wants inpt treatment but very agitated with HI) Elopement Risk: No Does patient have medical clearance?: Yes     Disposition: Per [name], PA, inpatient treatment is recommended.  Disposition Initial Assessment Completed for this Encounter: Yes Disposition of Patient: Inpatient treatment program Type of inpatient treatment program: Adult  Cyndie Mull, [facility] Triage Specialist  07/06/2014 5:38 AM

## 2014-07-06 NOTE — ED Notes (Signed)
Pt brought in by EMS stating he is going to kill himself or someone else  Pt states he has been off his medications for an unknown time

## 2014-07-06 NOTE — ED Provider Notes (Signed)
CSN: [id number]     Arrival date & time 07/06/14  0349 History   First MD Initiated Contact with Patient 07/06/14 [phone]     Chief Complaint  Patient presents with  . Suicidal  . Homicidal     (Consider location/radiation/quality/duration/timing/severity/associated sxs/prior Treatment) HPI Comments: Patient well known to ED arrives via EMS stating "I'm going to kill myself or somebody else."  The history is provided by the patient. No language interpreter was used.    Past Medical History  Diagnosis Date  . Diabetes mellitus without complication   . Hypertension   . Schizophrenia   . CHF (congestive heart failure)   . Neuropathy   . Polysubstance abuse   . Cocaine abuse   . Homelessness    History reviewed. No pertinent past surgical history. Family History  Problem Relation Age of Onset  . Hypertension Other   . Diabetes Other    History  Substance Use Topics  . Smoking status: Current Every Day Smoker -- 1.00 packs/day for 20 years    Types: Cigarettes  . Smokeless tobacco: Current User  . Alcohol Use: Yes     Comment: 40oz on 04/01/14    Review of Systems  Constitutional: Negative for fever and chills.  HENT: Negative.   Respiratory: Negative.   Cardiovascular: Negative.   Gastrointestinal: Negative.   Musculoskeletal: Negative.   Skin: Negative.   Neurological: Negative.       Allergies  Haldol  Home Medications   Prior to Admission medications   Medication Sig Start Date End Date Taking? Authorizing Provider  ARIPiprazole (ABILIFY) 5 MG tablet Take 1 tablet (5 mg total) by mouth 2 (two) times daily. 06/04/14   Beau FannyJohn C Withrow, FNP  benztropine (COGENTIN) 2 MG tablet Take 1 tablet (2 mg total) by mouth 2 (two) times daily. 06/04/14   Beau FannyJohn C Withrow, FNP  cloNIDine (CATAPRES) 0.2 MG tablet Take 0.2 mg by mouth 2 (two) times daily.    Historical Provider, MD  fluPHENAZine decanoate (PROLIXIN) 25 MG/ML injection Inject 1 mL (25 mg total) into the muscle  every 14 (fourteen) days. Patient not taking: Reported on 06/24/2014 06/04/14   Beau FannyJohn C Withrow, FNP  hydrochlorothiazide (HYDRODIURIL) 25 MG tablet Take 1 tablet (25 mg total) by mouth daily. 06/04/14   Everardo AllJohn C Withrow, FNP   BP 169/89 mmHg  Pulse 85  Temp(Src) 97.8 F (36.6 C) (Oral)  Resp 20  SpO2 100% Physical Exam  Constitutional: He is oriented to person, place, and time. He appears well-developed and well-nourished.  Neck: Normal range of motion.  Pulmonary/Chest: Effort normal.  Musculoskeletal: Normal range of motion.  Neurological: He is alert and oriented to person, place, and time.  Skin: Skin is warm and dry.  Psychiatric: His affect is angry. He is agitated. He is noncommunicative.    ED Course  Procedures (including critical care time) Labs Review Labs Reviewed  ACETAMINOPHEN LEVEL  CBC  COMPREHENSIVE METABOLIC PANEL  ETHANOL  SALICYLATE LEVEL  URINE RAPID DRUG SCREEN (HOSP PERFORMED)    Imaging Review No results found.   EKG Interpretation None      MDM   Final diagnoses:  None    TTS consult to determine disposition.    Arnoldo HookerShari A Kanyon Bunn, PA-C 07/06/14 0543  Loren Raceravid Yelverton, MD 07/08/14 0005

## 2014-07-06 NOTE — BH Assessment (Signed)
[facility] Assessment Progress Note  Per Thedore MinsMojeed Akintayo, MD, [facility].  [facility] it was discovered that pt does not at this time receive ACT Team services.  His discharge instructions include referral information for both [name] and [facility].  Doylene Canninghomas Hagen Tidd, MA Triage Specialist 07/06/2014 @ 13:42

## 2014-07-06 NOTE — [facility] Counselor (Signed)
Per Donell SievertSpencer Simon, PA, pt meets inpt criteria.   Counselor spoke with Hassie BruceAC, Kim. Per Selena BattenKim, [facility]. TTS to seek placement elsewhere.   Cyndie MullAnna Wylan Gentzler, [facility] Triage Specialist

## 2014-07-06 NOTE — Consult Note (Signed)
[facility] Face-to-Face Psychiatry Consult   Reason for Consult:  Cocaine Dependence, Suicidal thought Referring Physician:  EDP MCARTHUR [name] is an 54 y.o. male. Total Time spent with patient: 45 minutes  Assessment: DSM5  Chronic Schizophrenia, unspecified type, Cocaine use disorder, severe     Past Medical History  Diagnosis Date  . Diabetes mellitus without complication   . Hypertension   . Schizophrenia   . CHF (congestive heart failure)   . Neuropathy   . Polysubstance abuse   . Cocaine abuse   . Homelessness     Plan:  No evidence of imminent risk to self or others at present.   Discussed crisis plan, support from social network, calling 911, coming to the Emergency Department, and calling Suicide Hotline. Discharge home.  Subjective:   [name] is a 54 y.o. male patient admitted with Suicidal and Homicidal ideation  HPI:  [name], 54 years old was seen this morning in his room brought in by Ambulance for endorsing suicide and homicide.  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  He became angry, agitated towards people who refused to give him money.  Patient also stated that he uses the money given to him by people to buy Cocaine.  He was disappointed and angry because they were not giving him money.  He also has not been taking his medications for Schizophrenia.  Patient states that he is homeless and that he like being on the streets.  Today he was calm and cooperative during the assessment.  Patient denied SI/HI/AVH and stated that he was angry for a brief moment when he could not get money to feed his Cocaine craving.  Patient will be discharged home.  He was happy that he was fed and given his medications.    HPI Elements:   Location:  Cocaine use disorder, Chronic Schizophrenia unspecified type, Suicidal adnd homicidal ideation. Quality:  moderate. Severity:  moderate. Timing:  sudden, acute. Duration:  Chronic Cocaine use and  Schizophrenic. Context:  Brought in by the [facility].Marland Kitchen  Past Psychiatric History: Past Medical History  Diagnosis Date  . Diabetes mellitus without complication   . Hypertension   . Schizophrenia   . CHF (congestive heart failure)   . Neuropathy   . Polysubstance abuse   . Cocaine abuse   . Homelessness     reports that he has been smoking Cigarettes.  He has a 20 pack-year smoking history. He uses smokeless tobacco. He reports that he drinks alcohol. He reports that he uses illicit drugs ("Crack" cocaine, Cocaine, and Marijuana) about 7 times per week. Family History  Problem Relation Age of Onset  . Hypertension Other   . Diabetes Other    Family History Substance Abuse: Yes, Describe: (Crack cocaine; etoh and THC by hx) Family Supports: No Living Arrangements: Alone Can pt return to current living arrangement?: Yes Abuse/Neglect [facility]) Physical Abuse: Denies Verbal Abuse: Denies Sexual Abuse: Denies Allergies:   Allergies  Allergen Reactions  . Haldol [Haloperidol] Other (See Comments)    Muscle spasms, loss of voluntary movement. However, pt has taken Thorazine on multiple occasions with no adverse effects.     ACT Assessment Complete:  Yes:    Educational Status    Risk to Self: Risk to self with the past 6 months Suicidal Ideation: Yes-Currently Present Suicidal Intent: No Is patient at risk for suicide?: Yes Suicidal Plan?: No Specify Current Suicidal Plan: UTA  Access to Means: No Specify Access to Suicidal Means: na What has been your use of drugs/alcohol within the last 12 months?: Daily crack use; Hx of etoh and THC use Previous Attempts/Gestures: Yes How many times?: 5 Other Self Harm Risks: None known Triggers for Past Attempts: Unpredictable Intentional Self Injurious Behavior: None Comment - Self Injurious Behavior: na Family Suicide History: Unknown Recent stressful life event(s): Conflict (Comment),  Other (Comment) (Persecutory delusions that everyone is out to harm him) Persecutory voices/beliefs?: Yes Depression: Yes Depression Symptoms: Despondent, Fatigue, Feeling angry/irritable Substance abuse history and/or treatment for substance abuse?: Yes Suicide prevention information given to non-admitted patients: Not applicable  Risk to Others: Risk to Others within the past 6 months Homicidal Ideation: Yes-Currently Present Thoughts of Harm to Others: Yes-Currently Present Comment - Thoughts of Harm to Others: Pt did not disclose Current Homicidal Intent: No Current Homicidal Plan: No (Pt did not disclose) Describe Current Homicidal Plan: Unknown Access to Homicidal Means: No Describe Access to Homicidal Means: Says he cannot gain access to a gun Identified Victim: Family members, strangers in the communnity History of harm to others?:  (Unknown) Assessment of Violence: None Noted Violent Behavior Description: Agitation, Says he wants to kill someone or himself Does patient have access to weapons?: No Criminal Charges Pending?: Yes Describe Pending Criminal Charges: Unknown Does patient have a court date: Yes Court Date:  (Unknown)  Abuse: Abuse/Neglect Assessment (Assessment to be complete while patient is alone) Physical Abuse: Denies Verbal Abuse: Denies Sexual Abuse: Denies Exploitation of patient/patient's resources: Denies Self-Neglect: Denies  Prior Inpatient Therapy: Prior Inpatient Therapy Prior Inpatient Therapy: Yes Prior Therapy Dates: Multiple admissions Prior Therapy Facilty/Provider(s): [facility], [facility], [facility], others Reason for Treatment: SI/HI, psychosis  Prior Outpatient Therapy: Prior Outpatient Therapy Prior Outpatient Therapy: Yes Prior Therapy Dates: Current Prior Therapy Facilty/Provider(s): [name] Reason for Treatment: ACTT services  Additional Information: Additional Information 1:1 In Past 12 Months?: No CIRT Risk: Yes (Wants inpt treatment but  very agitated with HI) Elopement Risk: No Does patient have medical clearance?: Yes   Objective: Blood pressure 134/70, pulse 89, temperature 99.2 F (37.3 C), temperature source Oral, resp. rate 18, SpO2 99 %.There is no weight on file to calculate BMI. Results for orders placed or performed during the hospital encounter of 07/06/14 (from the past 72 hour(s))  Urine Drug Screen     Status: Abnormal   Collection Time: 07/06/14  4:09 AM  Result Value Ref Range   Opiates NONE DETECTED NONE DETECTED   Cocaine POSITIVE (A) NONE DETECTED   Benzodiazepines NONE DETECTED NONE DETECTED   Amphetamines NONE DETECTED NONE DETECTED   Tetrahydrocannabinol NONE DETECTED NONE DETECTED   Barbiturates NONE DETECTED NONE DETECTED    Comment:        DRUG SCREEN FOR MEDICAL PURPOSES ONLY.  IF CONFIRMATION IS NEEDED FOR ANY PURPOSE, NOTIFY LAB WITHIN 5 DAYS.        LOWEST DETECTABLE LIMITS FOR URINE DRUG SCREEN Drug Class       Cutoff (ng/mL) Amphetamine      1000 Barbiturate      200 Benzodiazepine   921 Tricyclics       194 Opiates          300 Cocaine          300 THC              50   Acetaminophen level     Status: Abnormal   Collection Time: 07/06/14  4:21 AM  Result Value Ref Range   Acetaminophen (Tylenol), Serum <10.0 (L) 10 - 30 ug/mL    Comment:        THERAPEUTIC CONCENTRATIONS VARY SIGNIFICANTLY. A RANGE OF 10-30 ug/mL MAY BE AN EFFECTIVE CONCENTRATION FOR MANY PATIENTS. HOWEVER, SOME ARE BEST TREATED AT CONCENTRATIONS OUTSIDE THIS RANGE. ACETAMINOPHEN CONCENTRATIONS >150 ug/mL AT 4 HOURS AFTER INGESTION AND >50 ug/mL AT 12 HOURS AFTER INGESTION ARE OFTEN ASSOCIATED WITH TOXIC REACTIONS.   CBC     Status: None   Collection Time: 07/06/14  4:21 AM  Result Value Ref Range   WBC 6.5 4.0 - 10.5 K/uL   RBC 4.55 4.22 - 5.81 MIL/uL   Hemoglobin 13.2 13.0 - 17.0 g/dL   HCT 41.6 39.0 - 52.0 %   MCV 91.4 78.0 - 100.0 fL   MCH 29.0 26.0 - 34.0 pg   MCHC 31.7 30.0 - 36.0  g/dL   RDW 13.0 11.5 - 15.5 %   Platelets 225 150 - 400 K/uL  Comprehensive metabolic panel     Status: Abnormal   Collection Time: 07/06/14  4:21 AM  Result Value Ref Range   Sodium 139 135 - 145 mmol/L    Comment: Please note change in reference range.   Potassium 3.3 (L) 3.5 - 5.1 mmol/L    Comment: Please note change in reference range.   Chloride 102 96 - 112 mEq/L   CO2 30 19 - 32 mmol/L   Glucose, Bld 87 70 - 99 mg/dL   BUN 13 6 - 23 mg/dL   Creatinine, Ser 0.81 0.50 - 1.35 mg/dL   Calcium 9.7 8.4 - 10.5 mg/dL   Total Protein 7.8 6.0 - 8.3 g/dL   Albumin 4.6 3.5 - 5.2 g/dL   AST 28 0 - 37 U/L   ALT 13 0 - 53 U/L   Alkaline Phosphatase 92 39 - 117 U/L   Total Bilirubin 1.1 0.3 - 1.2 mg/dL   GFR calc non Af Amer >90 >90 mL/min   GFR calc Af Amer >90 >90 mL/min    Comment: (NOTE) The eGFR has been calculated using the CKD EPI equation. This calculation has not been validated in all clinical situations. eGFR's persistently <90 mL/min signify possible Chronic Kidney Disease.    Anion gap 7 5 - 15  Ethanol (ETOH)     Status: None   Collection Time: 07/06/14  4:21 AM  Result Value Ref Range   Alcohol, Ethyl (B) <5 0 - 9 mg/dL    Comment:        LOWEST DETECTABLE LIMIT FOR SERUM ALCOHOL IS 11 mg/dL FOR MEDICAL PURPOSES ONLY   Salicylate level     Status: None   Collection Time: 07/06/14  4:21 AM  Result Value Ref Range   Salicylate Lvl <3.8 2.8 - 20.0 mg/dL   Labs are reviewed and are pertinent for UDS is positive for Cocaine.  Current Facility-Administered Medications  Medication Dose Route Frequency Provider Last Rate Last Dose  . acetaminophen (TYLENOL) tablet 650 mg  650 mg Oral Q4H PRN Shari A Upstill, PA-C   650 mg at 07/06/14 0830  . ARIPiprazole (ABILIFY) tablet 5 mg  5 mg Oral Daily Yvonnia Tango   5 mg at 07/06/14 1107  . benztropine (COGENTIN) tablet 1 mg  1 mg Oral Daily Leeya Rusconi   1 mg at 07/06/14 1107  . ondansetron (ZOFRAN) tablet 4 mg  4 mg  Oral Q8H PRN Dewaine Oats, PA-C  Current Outpatient Prescriptions  Medication Sig Dispense Refill  . ARIPiprazole (ABILIFY) 5 MG tablet Take 1 tablet (5 mg total) by mouth 2 (two) times daily. 60 tablet 0  . benztropine (COGENTIN) 2 MG tablet Take 1 tablet (2 mg total) by mouth 2 (two) times daily. 30 tablet 0  . cloNIDine (CATAPRES) 0.2 MG tablet Take 0.2 mg by mouth 2 (two) times daily.    . hydrochlorothiazide (HYDRODIURIL) 25 MG tablet Take 1 tablet (25 mg total) by mouth daily. 30 tablet 0  . fluPHENAZine decanoate (PROLIXIN) 25 MG/ML injection Inject 1 mL (25 mg total) into the muscle every 14 (fourteen) days. (Patient not taking: Reported on 06/24/2014) 5 mL     Psychiatric Specialty Exam:     Blood pressure 134/70, pulse 89, temperature 99.2 F (37.3 C), temperature source Oral, resp. rate 18, SpO2 99 %.There is no weight on file to calculate BMI.  General Appearance: Casual and Disheveled  Eye Contact::  Good  Speech:  Clear and Coherent and Normal Rate  Volume:  Normal  Mood:  Euthymic  Affect:  Congruent  Thought Process:  Coherent, Goal Directed and Intact  Orientation:  Full (Time, Place, and Person)  Thought Content:  WDL  Suicidal Thoughts:  No  Homicidal Thoughts:  No  Memory:  Immediate;   Good Recent;   Good Remote;   Fair  Judgement:  Poor  Insight:  Fair  Psychomotor Activity:  Normal  Concentration:  Good  Recall:  NA  Fund of Knowledge:Good  Language: Good  Akathisia:  NA  Handed:  Right  AIMS (if indicated):     Assets:  Desire for Improvement Housing  Sleep:      Musculoskeletal: Strength & Muscle Tone: within normal limits Gait & Station: normal Patient leans: N/A  Treatment Plan Summary: Discharge home   Delfin Gant   PMHNP-BC 07/06/2014 12:55 PM  Patient seen, evaluated and I agree with notes by Nurse Practitioner. Corena Pilgrim, MD

## 2014-07-06 NOTE — [facility] Counselor (Signed)
TTS Counselor spoke with pt's attending physician, Dr. Ranae PalmsYelverton, in order to gain collateral info. Counselor informed MD that behavioral health assessment would begin shortly.

## 2014-07-06 NOTE — ED Notes (Signed)
TTS in with pt. 

## 2014-07-06 NOTE — ED Notes (Signed)
D:Pt reported to Clinical research associatewriter as entering room to introduce self that he was diabetic and wanted orange juice. When questioning pt about diabetes, he responded "I'm as stupid as hell" and that he just wanted some OJ. He then went on rapidly requesting Abilify, a bus ticket, tylenol, a shower and breakfast. A:Pt given OJ, towels and support. Reported other requests to SW. R:Pt denies si and hi. Safety maintained on the unit.

## 2014-07-07 ENCOUNTER — Encounter (HOSPITAL_COMMUNITY): Payer: Self-pay | Admitting: Emergency Medicine

## 2014-07-07 ENCOUNTER — Emergency Department (HOSPITAL_COMMUNITY)
Admission: EM | Admit: 2014-07-07 | Discharge: 2014-07-07 | Disposition: A | Discharge status: Home / Self Care | Payer: Medicare Other | Attending: Emergency Medicine | Admitting: Emergency Medicine

## 2014-07-07 DIAGNOSIS — R45851 Suicidal ideations: Secondary | ICD-10-CM | POA: Insufficient documentation

## 2014-07-07 DIAGNOSIS — R451 Restlessness and agitation: Secondary | ICD-10-CM | POA: Insufficient documentation

## 2014-07-07 DIAGNOSIS — Z590 Homelessness: Secondary | ICD-10-CM | POA: Diagnosis not present

## 2014-07-07 DIAGNOSIS — I10 Essential (primary) hypertension: Secondary | ICD-10-CM | POA: Diagnosis not present

## 2014-07-07 DIAGNOSIS — I509 Heart failure, unspecified: Secondary | ICD-10-CM | POA: Diagnosis not present

## 2014-07-07 DIAGNOSIS — Z8669 Personal history of other diseases of the nervous system and sense organs: Secondary | ICD-10-CM | POA: Insufficient documentation

## 2014-07-07 DIAGNOSIS — Z720 Tobacco use: Secondary | ICD-10-CM | POA: Insufficient documentation

## 2014-07-07 DIAGNOSIS — Z79899 Other long term (current) drug therapy: Secondary | ICD-10-CM | POA: Insufficient documentation

## 2014-07-07 DIAGNOSIS — F1994 Other psychoactive substance use, unspecified with psychoactive substance-induced mood disorder: Secondary | ICD-10-CM

## 2014-07-07 DIAGNOSIS — E119 Type 2 diabetes mellitus without complications: Secondary | ICD-10-CM | POA: Diagnosis not present

## 2014-07-07 DIAGNOSIS — F259 Schizoaffective disorder, unspecified: Secondary | ICD-10-CM

## 2014-07-07 LAB — RAPID URINE DRUG SCREEN, HOSP PERFORMED
AMPHETAMINES: NOT DETECTED
Barbiturates: NOT DETECTED
Benzodiazepines: NOT DETECTED
Cocaine: POSITIVE — AB
Opiates: NOT DETECTED
TETRAHYDROCANNABINOL: NOT DETECTED

## 2014-07-07 LAB — COMPREHENSIVE METABOLIC PANEL
ALK PHOS: 82 U/L (ref 39–117)
ALT: 12 U/L (ref 0–53)
AST: 29 U/L (ref 0–37)
Albumin: 4 g/dL (ref 3.5–5.2)
Anion gap: 6 (ref 5–15)
BUN: 12 mg/dL (ref 6–23)
CALCIUM: 9.3 mg/dL (ref 8.4–10.5)
CO2: 30 mmol/L (ref 19–32)
Chloride: 102 mEq/L (ref 96–112)
Creatinine, Ser: 0.84 mg/dL (ref 0.50–1.35)
GLUCOSE: 130 mg/dL — AB (ref 70–99)
POTASSIUM: 3 mmol/L — AB (ref 3.5–5.1)
SODIUM: 138 mmol/L (ref 135–145)
Total Bilirubin: 0.8 mg/dL (ref 0.3–1.2)
Total Protein: 7 g/dL (ref 6.0–8.3)

## 2014-07-07 LAB — URINALYSIS, ROUTINE W REFLEX MICROSCOPIC
BILIRUBIN URINE: NEGATIVE
Glucose, UA: NEGATIVE mg/dL
Hgb urine dipstick: NEGATIVE
Ketones, ur: NEGATIVE mg/dL
NITRITE: NEGATIVE
PH: 7 (ref 5.0–8.0)
Protein, ur: 30 mg/dL — AB
SPECIFIC GRAVITY, URINE: 1.018 (ref 1.005–1.030)
Urobilinogen, UA: 0.2 mg/dL (ref 0.0–1.0)

## 2014-07-07 LAB — CBC
HCT: 38 % — ABNORMAL LOW (ref 39.0–52.0)
Hemoglobin: 12 g/dL — ABNORMAL LOW (ref 13.0–17.0)
MCH: 28.4 pg (ref 26.0–34.0)
MCHC: 31.6 g/dL (ref 30.0–36.0)
MCV: 90 fL (ref 78.0–100.0)
Platelets: 213 10*3/uL (ref 150–400)
RBC: 4.22 MIL/uL (ref 4.22–5.81)
RDW: 13 % (ref 11.5–15.5)
WBC: 6.6 10*3/uL (ref 4.0–10.5)

## 2014-07-07 LAB — URINE MICROSCOPIC-ADD ON

## 2014-07-07 LAB — ACETAMINOPHEN LEVEL: Acetaminophen (Tylenol), Serum: <10 ug/mL — ABNORMAL LOW (ref 10–30)

## 2014-07-07 LAB — ETHANOL: Alcohol, Ethyl (B): <5 mg/dL (ref 0–9)

## 2014-07-07 LAB — SALICYLATE LEVEL: Salicylate Lvl: <4 mg/dL (ref 2.8–20.0)

## 2014-07-07 MED ORDER — POTASSIUM CHLORIDE CRYS ER 20 MEQ PO TBCR
40.0000 meq | EXTENDED_RELEASE_TABLET | Freq: Once | ORAL | Status: DC
  Filled 2014-07-07: qty 2

## 2014-07-07 MED ORDER — POTASSIUM CHLORIDE CRYS ER 20 MEQ PO TBCR: 40.0000 meq | EXTENDED_RELEASE_TABLET | Freq: Once | ORAL | Status: DC

## 2014-07-07 NOTE — ED Notes (Signed)
Staffing office notified.

## 2014-07-07 NOTE — ED Notes (Signed)
IN TO FINISH DISCHARGE. PATIENT TEARFUL. STATES HE IS JUST MAD BUT NOT AT ME. HE IS ABLE TO SPEAK CALMLY AND APPROPRIATLY. ALL BELONGINGS AND VALUALBLES RETURNED TO HIM

## 2014-07-07 NOTE — ED Notes (Signed)
Per EMS, [employer] was called out because the pt was trespassing, and then [employer] called EMS for a "check up". Pt reports SI/HI, unsure for how long. Pt is manic at this time, unable to sit still, and is clamping and unclamping his jaw loudly. Upon arrival to the ER, a long screwdriver fell out of his pocket, and was given to [employer]. [place] to [facility].

## 2014-07-07 NOTE — ED Notes (Signed)
House sitter has arrived.

## 2014-07-07 NOTE — ED Notes (Signed)
Phlebotomy at the bedside, patient still agitated.

## 2014-07-07 NOTE — ED Notes (Signed)
Glick, MD at bedside.  

## 2014-07-07 NOTE — ED Notes (Signed)
Tele psych monitor at bedside 

## 2014-07-07 NOTE — Discharge Instructions (Signed)

## 2014-07-07 NOTE — ED Notes (Signed)
Pt refuses to allow RN to take vitals

## 2014-07-07 NOTE — ED Notes (Signed)
Security wanded patient. Placed in lavender scrubs.

## 2014-07-07 NOTE — ED Notes (Signed)
[employer] now standing at the bedside as well.

## 2014-07-07 NOTE — ED Notes (Signed)
Tele-psych in progress at bedside.

## 2014-07-07 NOTE — Progress Notes (Signed)
CSW contacted [facility] to discuss Pt's case and to assess for resources.  Staff reported that Pt only has Medicare but is in their system so a care coordination referral could be made. CSW placed care coordination referral and Orie RoutJohn Mark, clinical specialist, reported that a decision would be made within 7 days.   CSW also called [name] to inquire about Pt's history with their services.  Amy, ACTT staff, reported that Pt had never been on their ACTT services but had been seen for outpatient services.    Insurance will serve as a barrier to Pt receiving enhanced community services such as ACTT as Pt only has Medicare.  [facility]  Chad CordialLauren Carter, LCSWA 07/07/2014 11:02 AM

## 2014-07-07 NOTE — ED Notes (Addendum)
Humes, PA at bedside. Pt is becoming agitated.

## 2014-07-07 NOTE — ED Notes (Signed)
TELEPSYCH IN PROGRESS 

## 2014-07-07 NOTE — BH Assessment (Addendum)
Tele Assessment Note   Taylor DouglasFredrick L [name] is an 54 y.o. male.  Pt presented to the ED tonight brought in by the [employer] after pt was found to be trespassing.  [facility] approximately 2 pm with OP resources.  Pt initially told staff that he had SI.  Pt reported in assessment that he actually was "not suicidal but homicidal."  Pt expressed his anger at those who he states "are trying to kill him."  Pt continued to ramble verbally introducing paranoid and persecutory beliefs that were a continuing theme during the assessment.  Pt made threats to harm others specifying no one in particular.  Pt's thought processes deteriorated during the assessment to the point of complete disorganized thoughts, sometime tangential, but always involving paranoia.  Pt advised early in the assessment that he had not been taking his prescribed medication for a number of weeks.  Pt advised that he had been getting [facility] but, recently had not been in touch with them for help.  Pt has previous diagnoses of Bipolar Disorder and Schizophrenia.  Pt also advised he had been "drugging" and could not answer any questions regarding how much or how often he was using.   Pt advised of "many" previous hospitalizations for MH issues but denied any OPT.  Pt tested positive for [facility]06[facility]14 [facility] but negative for all other drugs.  Pt's actions, mood and behavior were labile.  Pt was cooperative during most of the assessment but became agitated during the last part of the assessment finally stating, "I'm done talking" and taking his bed sheet and blanket, curling into a ball lying on the bed with his blanket covering his entire body, head and face.  Pt talked clearly but with pressured speech.  Pt was hyperactive during the assessment continually changing positions in his bed and rearranging his bed coverings. Pt's eye contact was fair at first changing into no eye contact.  Pt  stated he was "depressed" and "frustrated" and "very angry."  His blunted, at times angry affect was congruent with his stated mood. Pt's thought processes deteriorated into disorganized, tangential (at times) thoughts and his judgement and insight were clearly impaired.  Recommendation is to have pt stay in ED until TTS or SW can [facility] or refer to another similar service.    Axis I: 311 Unspecified Depressive Disorder; Schizophrenia by hx;  Axis II: Deferred Axis III:  Past Medical History  Diagnosis Date  . Diabetes mellitus without complication   . Hypertension   . Schizophrenia   . CHF (congestive heart failure)   . Neuropathy   . Polysubstance abuse   . Cocaine abuse   . Homelessness    Axis IV: economic problems, housing problems, other psychosocial or environmental problems, problems related to social environment and problems with primary support group Axis V: 11-20 some danger of hurting self or others possible OR occasionally fails to maintain minimal personal hygiene OR gross impairment in communication  Past Medical History:  Past Medical History  Diagnosis Date  . Diabetes mellitus without complication   . Hypertension   . Schizophrenia   . CHF (congestive heart failure)   . Neuropathy   . Polysubstance abuse   . Cocaine abuse   . Homelessness     History reviewed. No pertinent past surgical history.  Family History:  Family History  Problem Relation Age of Onset  . Hypertension  Other   . Diabetes Other     Social History:  reports that he has been smoking Cigarettes.  He has a 20 pack-year smoking history. He uses smokeless tobacco. He reports that he drinks alcohol. He reports that he uses illicit drugs ("Crack" cocaine, Cocaine, and Marijuana) about 7 times per week.  Additional Social History:     CIWA: CIWA-Ar BP: 181/88 mmHg Pulse Rate: 83 COWS:    PATIENT STRENGTHS: (choose at least two) Average or above average  intelligence Communication skills  Allergies:  Allergies  Allergen Reactions  . Haldol [Haloperidol] Other (See Comments)    Muscle spasms, loss of voluntary movement. However, pt has taken Thorazine on multiple occasions with no adverse effects.     Home Medications:  (Not in a hospital admission)  OB/GYN Status:  No LMP for male patient.  General Assessment Data Location of Assessment: [facility] ED ACT Assessment:  (na) Is this a Tele or Face-to-Face Assessment?: Tele Assessment Is this an Initial Assessment or a Re-assessment for this encounter?: Initial Assessment Living Arrangements: Alone (Homeless) Can pt return to current living arrangement?: Yes Admission Status: Voluntary Is patient capable of signing voluntary admission?: No (pt is psychotic with AH) Transfer from: Unknown Referral Source: Other ([employer] brought pt to ED)  Medical Screening Exam [facility] Walk-in ONLY) Medical Exam completed: No Reason for MSE not completed: Other: (labs not back yet)  [facility] Crisis Care Plan Living Arrangements: Alone (Homeless) Name of Psychiatrist: none at this time per pt Name of Therapist: none  Education Status Is patient currently in school?: No Current Grade:  (na) Highest grade of school patient has completed:  (na) Name of school:  (na) Contact person:  (na)  Risk to self with the past 6 months Suicidal Ideation: No (denies) Suicidal Intent: No (denies) Is patient at risk for suicide?: No Suicidal Plan?: No Specify Current Suicidal Plan: na Access to Means: No (denies) Specify Access to Suicidal Means: na What has been your use of drugs/alcohol within the last 12 months?: daily use per pt Previous Attempts/Gestures: Yes How many times?:  (unknown) Other Self Harm Risks:  (UTA) Triggers for Past Attempts: Hallucinations, Other personal contacts Intentional Self Injurious Behavior:  (none noted) Comment - Self Injurious Behavior: na Family Suicide History: Unable to  assess Recent stressful life event(s): Loss (Comment) (Homelessness) Persecutory voices/beliefs?: Yes (Pt verbalized many beliefs that he was in danger from others) Depression: Yes Depression Symptoms: Despondent, Insomnia, Isolating, Feeling angry/irritable, Feeling worthless/self pity, Loss of interest in usual pleasures Substance abuse history and/or treatment for substance abuse?: Yes Suicide prevention information given to non-admitted patients: Not applicable  Risk to Others within the past 6 months Homicidal Ideation: Yes-Currently Present (pt stated "I'm not suicidal I'm homicidal....you'll see") Thoughts of Harm to Others: Yes-Currently Present Comment - Thoughts of Harm to Others: pt would not give details Current Homicidal Intent: Yes-Currently Present Current Homicidal Plan: Yes-Currently Present Describe Current Homicidal Plan: pt would not give details Access to Homicidal Means:  (unknown) Describe Access to Homicidal Means: na Identified Victim: pt did not name anyone when asked History of harm to others?:  (unknown) Assessment of Violence:  (unknown) Violent Behavior Description: unknown Does patient have access to weapons?:  (denies) Criminal Charges Pending?:  (denies) Describe Pending Criminal Charges: unknown Does patient have a court date:  (unknown) Court Date:  (na)  Psychosis Hallucinations: Auditory, With command (pt stated he hears voices "telling me to do things") Delusions: Persecutory  Mental Status Report Appear/Hygiene:  In scrubs Eye Contact: Fair Motor Activity: Hyperactivity Speech: Pressured Level of Consciousness: Alert, Irritable Mood: Suspicious, Angry, Depressed Affect: Apprehensive, Depressed, Irritable Anxiety Level:  (unknown) Thought Processes: Tangential Judgement: Impaired Orientation: Person, Place Obsessive Compulsive Thoughts/Behaviors: Unable to Assess  Cognitive Functioning Concentration: Poor Memory: Unable to  Assess IQ: Average Insight: Poor Impulse Control: Poor Appetite:  (UTA) Weight Loss:  (na) Weight Gain:  (na) Sleep: Unable to Assess Total Hours of Sleep:  (unknown, pt would not answer) Vegetative Symptoms: Unable to Assess  ADLScreening [facility] Assessment Services) Patient's cognitive ability adequate to safely complete daily activities?: Yes Patient able to express need for assistance with ADLs?: Yes Independently performs ADLs?: Yes (appropriate for developmental age)  Prior Inpatient Therapy Prior Inpatient Therapy: Yes Prior Therapy Dates:  (many admissions) Prior Therapy Facilty/Provider(s): [facility], [facility] Reason for Treatment: Psychosis, SI/HI  Prior Outpatient Therapy Prior Outpatient Therapy: Yes Prior Therapy Dates:  (unknown; pt would not answer) Prior Therapy Facilty/Provider(s): [name] (Pt stated he no longer was with [name]) Reason for Treatment: ACTT  ADL Screening (condition at time of admission) Patient's cognitive ability adequate to safely complete daily activities?: Yes Patient able to express need for assistance with ADLs?: Yes Independently performs ADLs?: Yes (appropriate for developmental age)     Advance Directives (For Healthcare) Does patient have an advance directive?: No Would patient like information on creating an advanced directive?: No - patient declined information    Additional Information 1:1 In Past 12 Months?: No CIRT Risk: Yes Elopement Risk: No Does patient have medical clearance?: No (labs are not in at this time)   Disposition Initial Assessment Completed for this Encounter: Yes Disposition of Patient: Other dispositions (Pending) Type of inpatient treatment program: Adult Other disposition(s): Other (Comment) (Pending) Patient referred to: Other (Comment)  Disposition: Gained clinical information prior to assessment through ED nurse, Yvonna Alanis, and previous records.    Recommend having pt stay in ED until TTS or SW can  re-link/refer pt to his ACTT team at [facility] or another similar service as pt was discharged earlier today with resource information but did not follow-up.  Consulted with [name], [facility], who agreed with recommendation.   Updated Dr. Dione Booze with recommendation and he agreed with recommendation.     Beryle Flock, MS, [facility], [facility] Triage Specialist [facility]   07/07/2014 5:59 AM

## 2014-07-07 NOTE — ED Notes (Signed)
PATIENT IS READY FOR DISCHARGE. BH HAS ASKED TO HOLD HIM FOR ABOUT AN HOUR TO TRY TO GET AN ACT TEAM ESTABLISHED FOR HIM.

## 2014-07-07 NOTE — ED Notes (Signed)
Pt reports last use of cocaine was yesterday.

## 2014-07-07 NOTE — ED Notes (Signed)
Breakfast tray ordered. Milk provided now that orange juice has been consumed. Telepsych being conducted.

## 2014-07-07 NOTE — ED Notes (Signed)
Pt very agitated, pt states, "I do not want to have anything to do with your race. Get the hell of out of my room. All of you white people will be fixed. Your race did this to you. Sit my stuff down & get out."

## 2014-07-07 NOTE — ED Notes (Signed)
Pt continuing to holler out inappropriate statements, "why don't you go to [employer] bitch!" Quiet and cooperative when staff members are not in line of sight.

## 2014-07-07 NOTE — ED Notes (Signed)
Patient yelling, stating "that slut better bring me 2 milks" witnessed by sitter. No milks provided. [employer] passes by room on rounds.

## 2014-07-07 NOTE — Consult Note (Signed)
Telepsych Consultation   Reason for Consult:  Agitation, Substance abuse, mood lability Referring Physician:  EDP LENG [name] is an 54 y.o. male.  Assessment: AXIS I:  Substance Abuse and Substance Induced Mood Disorder, Schizophrenia AXIS II:  Deferred AXIS III:   Past Medical History  Diagnosis Date  . Diabetes mellitus without complication   . Hypertension   . Schizophrenia   . CHF (congestive heart failure)   . Neuropathy   . Polysubstance abuse   . Cocaine abuse   . Homelessness    AXIS IV:  economic problems, occupational problems, other psychosocial or environmental problems, problems related to social environment, problems with access to health care services and problems with primary support group AXIS V:  61-70 mild symptoms  Plan:  No evidence of imminent risk to self or others at present.   Patient does not meet criteria for psychiatric inpatient admission. Supportive therapy provided about ongoing stressors. Refer to IOP. Discussed crisis plan, support from social network, calling 911, coming to the Emergency Department, and calling Suicide Hotline.  Subjective:   [name] is a 54 y.o. male patient presenting to the [facility] initially with reports of crack/cocaine abuse and severe lability with agitation. Pt is well-known to this NP x 10 years with good rapport. Pt initially made suicidal statements, then denied, then made homicidal statements. Pt denies SI, HI, and AVH at this time and contracts for safety. Pt reports "I'm fine now, I don't want to hurt myself or anyone else, I just want a bus pass so I can go home. I just had to sleep. I ate breakfast already so just give me a bus pass so I can leave". Pt is alert/oriented x4, and cooperative, although slightly anxious and wanting to leave. Pt in agreement to give [place] time to get the social worker to talk to him about followup and to acquire a bus pass for him. He then laid down as if preparing to go back to  sleep. [place] TTS staff are working to secure an ACT Team at this time, although it may not be possible given his insurance status.   HPI:  [name] is an 54 y.o. male. Pt presented to the ED tonight brought in by the [employer] after pt was found to be trespassing. Pt was discharged from [facility] 07/06/14 approximately 2 pm with OP resources. Pt initially told staff that he had SI. Pt reported in assessment that he actually was "not suicidal but homicidal." Pt expressed his anger at those who he states "are trying to kill him." Pt continued to ramble verbally introducing paranoid and persecutory beliefs that were a continuing theme during the assessment. Pt made threats to harm others specifying no one in particular. Pt's thought processes deteriorated during the assessment to the point of complete disorganized thoughts, sometime tangential, but always involving paranoia. Pt advised early in the assessment that he had not been taking his prescribed medication for a number of weeks. Pt advised that he had been getting services from the [facility] ACT team but, recently had not been in touch with them for help. Pt has previous diagnoses of Bipolar Disorder and Schizophrenia. Pt also advised he had been "drugging" and could not answer any questions regarding how much or how often he was using.   Pt advised of "many" previous hospitalizations for MH issues but denied any OPT. Pt tested positive for cocaine on 07/06/14 when he was in the [facility] but negative for all other drugs.  Pt's actions, mood and behavior were labile. Pt was cooperative during most of the assessment but became agitated during the last part of the assessment finally stating, "I'm done talking" and taking his bed sheet and blanket, curling into a ball lying on the bed with his blanket covering his entire body, head and face. Pt talked clearly but with pressured speech. Pt was hyperactive during the assessment continually changing positions in  his bed and rearranging his bed coverings. Pt's eye contact was fair at first changing into no eye contact. Pt stated he was "depressed" and "frustrated" and "very angry." His blunted, at times angry affect was congruent with his stated mood. Pt's thought processes deteriorated into disorganized, tangential (at times) thoughts and his judgement and insight were clearly impaired.  Recommendation is to have pt stay in ED until TTS or SW can re-link pt to [facility] ACTT or refer to another similar service.  HPI Elements:   Location:  Inpatient, [facility]. Quality:  Stable. Severity:  Mild/Moderate. Timing:  Constant. Duration: Chronic x 29 yrs Context:  Exacerbation of underlying diagnoses  Past Psychiatric History: Past Medical History  Diagnosis Date  . Diabetes mellitus without complication   . Hypertension   . Schizophrenia   . CHF (congestive heart failure)   . Neuropathy   . Polysubstance abuse   . Cocaine abuse   . Homelessness     reports that he has been smoking Cigarettes.  He has a 20 pack-year smoking history. He uses smokeless tobacco. He reports that he drinks alcohol. He reports that he uses illicit drugs ("Crack" cocaine, Cocaine, and Marijuana) about 7 times per week. Family History  Problem Relation Age of Onset  . Hypertension Other   . Diabetes Other    Family History Substance Abuse: Yes, Describe: Family Supports:  (unknown; pt would not answer) Living Arrangements: Alone (Homeless) Can pt return to current living arrangement?: Yes Allergies:   Allergies  Allergen Reactions  . Haldol [Haloperidol] Other (See Comments)    Muscle spasms, loss of voluntary movement. However, pt has taken Thorazine on multiple occasions with no adverse effects.     ACT Assessment Complete:  Yes:    Educational Status    Risk to Self: Risk to self with the past 6 months Suicidal Ideation: No (denies) Suicidal Intent: No (denies) Is patient at risk for suicide?: No Suicidal  Plan?: No Specify Current Suicidal Plan: na Access to Means: No (denies) Specify Access to Suicidal Means: na What has been your use of drugs/alcohol within the last 12 months?: daily use per pt Previous Attempts/Gestures: Yes How many times?:  (unknown) Other Self Harm Risks:  (UTA) Triggers for Past Attempts: Hallucinations, Other personal contacts Intentional Self Injurious Behavior:  (none noted) Comment - Self Injurious Behavior: na Family Suicide History: Unable to assess Recent stressful life event(s): Loss (Comment) (Homelessness) Persecutory voices/beliefs?: Yes (Pt verbalized many beliefs that he was in danger from others) Depression: Yes Depression Symptoms: Despondent, Insomnia, Isolating, Feeling angry/irritable, Feeling worthless/self pity, Loss of interest in usual pleasures Substance abuse history and/or treatment for substance abuse?: Yes Suicide prevention information given to non-admitted patients: Not applicable  Risk to Others: Risk to Others within the past 6 months Homicidal Ideation: Yes-Currently Present (pt stated "I'm not suicidal I'm homicidal....you'll see") Thoughts of Harm to Others: Yes-Currently Present Comment - Thoughts of Harm to Others: pt would not give details Current Homicidal Intent: Yes-Currently Present Current Homicidal Plan: Yes-Currently Present Describe Current Homicidal Plan: pt would not give  details Access to Homicidal Means:  (unknown) Describe Access to Homicidal Means: na Identified Victim: pt did not name anyone when asked History of harm to others?:  (unknown) Assessment of Violence:  (unknown) Violent Behavior Description: unknown Does patient have access to weapons?:  (denies) Criminal Charges Pending?:  (denies) Describe Pending Criminal Charges: unknown Does patient have a court date:  (unknown) Court Date:  (na)  Abuse: Abuse/Neglect Assessment (Assessment to be complete while patient is alone) Physical Abuse:  (pt would  not answer) Verbal Abuse:  (pt would not answer) Sexual Abuse:  (pt would not answer) Self-Neglect: Yes, present (Comment) (Pt is homeless and does not have basic needs met)  Prior Inpatient Therapy: Prior Inpatient Therapy Prior Inpatient Therapy: Yes Prior Therapy Dates:  (many admissions) Prior Therapy Facilty/Provider(s): [place], [place], [facility] Reason for Treatment: Psychosis, SI/HI  Prior Outpatient Therapy: Prior Outpatient Therapy Prior Outpatient Therapy: Yes Prior Therapy Dates:  (unknown; pt would not answer) Prior Therapy Facilty/Provider(s): [name] (Pt stated he no longer was with [facility]) Reason for Treatment: ACTT  Additional Information: Additional Information 1:1 In Past 12 Months?: No CIRT Risk: Yes Elopement Risk: No Does patient have medical clearance?: No (labs are not in at this time)    Objective: Blood pressure 181/88, pulse 83, temperature 97.8 F (36.6 C), temperature source Oral, resp. rate 16, height '5\' 9"'  (1.753 m), weight 77.111 kg (170 lb), SpO2 100 %.Body mass index is 25.09 kg/(m^2). Results for orders placed or performed during the hospital encounter of 07/07/14 (from the past 72 hour(s))  CBC     Status: Abnormal   Collection Time: 07/07/14  5:34 AM  Result Value Ref Range   WBC 6.6 4.0 - 10.5 K/uL   RBC 4.22 4.22 - 5.81 MIL/uL   Hemoglobin 12.0 (L) 13.0 - 17.0 g/dL   HCT 38.0 (L) 39.0 - 52.0 %   MCV 90.0 78.0 - 100.0 fL   MCH 28.4 26.0 - 34.0 pg   MCHC 31.6 30.0 - 36.0 g/dL   RDW 13.0 11.5 - 15.5 %   Platelets 213 150 - 400 K/uL  Comprehensive metabolic panel     Status: Abnormal   Collection Time: 07/07/14  5:34 AM  Result Value Ref Range   Sodium 138 135 - 145 mmol/L    Comment: Please note change in reference range.   Potassium 3.0 (L) 3.5 - 5.1 mmol/L    Comment: Please note change in reference range.   Chloride 102 96 - 112 mEq/L   CO2 30 19 - 32 mmol/L   Glucose, Bld 130 (H) 70 - 99 mg/dL   BUN 12 6 - 23 mg/dL   Creatinine, Ser 0.84  0.50 - 1.35 mg/dL   Calcium 9.3 8.4 - 10.5 mg/dL   Total Protein 7.0 6.0 - 8.3 g/dL   Albumin 4.0 3.5 - 5.2 g/dL   AST 29 0 - 37 U/L   ALT 12 0 - 53 U/L   Alkaline Phosphatase 82 39 - 117 U/L   Total Bilirubin 0.8 0.3 - 1.2 mg/dL   GFR calc non Af Amer >90 >90 mL/min   GFR calc Af Amer >90 >90 mL/min    Comment: (NOTE) The eGFR has been calculated using the CKD EPI equation. This calculation has not been validated in all clinical situations. eGFR's persistently <90 mL/min signify possible Chronic Kidney Disease.    Anion gap 6 5 - 15  Ethanol (ETOH)     Status: None   Collection Time: 07/07/14  5:34 AM  Result Value Ref Range   Alcohol, Ethyl (B) <5 0 - 9 mg/dL    Comment:        LOWEST DETECTABLE LIMIT FOR SERUM ALCOHOL IS 11 mg/dL FOR MEDICAL PURPOSES ONLY   Acetaminophen level     Status: Abnormal   Collection Time: 07/07/14  5:34 AM  Result Value Ref Range   Acetaminophen (Tylenol), Serum <10.0 (L) 10 - 30 ug/mL    Comment:        THERAPEUTIC CONCENTRATIONS VARY SIGNIFICANTLY. A RANGE OF 10-30 ug/mL MAY BE AN EFFECTIVE CONCENTRATION FOR MANY PATIENTS. HOWEVER, SOME ARE BEST TREATED AT CONCENTRATIONS OUTSIDE THIS RANGE. ACETAMINOPHEN CONCENTRATIONS >150 ug/mL AT 4 HOURS AFTER INGESTION AND >50 ug/mL AT 12 HOURS AFTER INGESTION ARE OFTEN ASSOCIATED WITH TOXIC REACTIONS.   Salicylate level     Status: None   Collection Time: 07/07/14  5:34 AM  Result Value Ref Range   Salicylate Lvl <3.2 2.8 - 20.0 mg/dL  Urine rapid drug screen (hosp performed)     Status: Abnormal   Collection Time: 07/07/14  7:35 AM  Result Value Ref Range   Opiates NONE DETECTED NONE DETECTED   Cocaine POSITIVE (A) NONE DETECTED   Benzodiazepines NONE DETECTED NONE DETECTED   Amphetamines NONE DETECTED NONE DETECTED   Tetrahydrocannabinol NONE DETECTED NONE DETECTED   Barbiturates NONE DETECTED NONE DETECTED    Comment:        DRUG SCREEN FOR MEDICAL PURPOSES ONLY.  IF CONFIRMATION IS  NEEDED FOR ANY PURPOSE, NOTIFY LAB WITHIN 5 DAYS.        LOWEST DETECTABLE LIMITS FOR URINE DRUG SCREEN Drug Class       Cutoff (ng/mL) Amphetamine      1000 Barbiturate      200 Benzodiazepine   951 Tricyclics       884 Opiates          300 Cocaine          300 THC              50   Urinalysis, Routine w reflex microscopic     Status: Abnormal   Collection Time: 07/07/14  7:38 AM  Result Value Ref Range   Color, Urine YELLOW YELLOW   APPearance HAZY (A) CLEAR   Specific Gravity, Urine 1.018 1.005 - 1.030   pH 7.0 5.0 - 8.0   Glucose, UA NEGATIVE NEGATIVE mg/dL   Hgb urine dipstick NEGATIVE NEGATIVE   Bilirubin Urine NEGATIVE NEGATIVE   Ketones, ur NEGATIVE NEGATIVE mg/dL   Protein, ur 30 (A) NEGATIVE mg/dL   Urobilinogen, UA 0.2 0.0 - 1.0 mg/dL   Nitrite NEGATIVE NEGATIVE   Leukocytes, UA SMALL (A) NEGATIVE  Urine microscopic-add on     Status: Abnormal   Collection Time: 07/07/14  7:38 AM  Result Value Ref Range   Squamous Epithelial / LPF RARE RARE   WBC, UA 11-20 <3 WBC/hpf   Bacteria, UA FEW (A) RARE   Urine-Other AMORPHOUS URATES/PHOSPHATES    Labs are reviewed and are pertinent for UDS (+) cocaine.   Current Facility-Administered Medications  Medication Dose Route Frequency Provider Last Rate Last Dose  . potassium chloride SA (K-DUR,KLOR-CON) CR tablet 40 mEq  40 mEq Oral Once Hyman Bible, PA-C   40 mEq at 07/07/14 1660   Current Outpatient Prescriptions  Medication Sig Dispense Refill  . ARIPiprazole (ABILIFY) 5 MG tablet Take 1 tablet (5 mg total) by mouth daily. 30 tablet 0  . benztropine (COGENTIN)  2 MG tablet Take 1 tablet (2 mg total) by mouth 2 (two) times daily. 30 tablet 0  . cloNIDine (CATAPRES) 0.2 MG tablet Take 0.2 mg by mouth 2 (two) times daily.    . hydrochlorothiazide (HYDRODIURIL) 25 MG tablet Take 1 tablet (25 mg total) by mouth daily. 30 tablet 0  . fluPHENAZine decanoate (PROLIXIN) 25 MG/ML injection Inject 1 mL (25 mg total) into the  muscle every 14 (fourteen) days. (Patient not taking: Reported on 06/24/2014) 5 mL     Psychiatric Specialty Exam:     Blood pressure 181/88, pulse 83, temperature 97.8 F (36.6 C), temperature source Oral, resp. rate 16, height '5\' 9"'  (1.753 m), weight 77.111 kg (170 lb), SpO2 100 %.Body mass index is 25.09 kg/(m^2).  General Appearance: Disheveled  Eye Contact::  Good  Speech:  Clear and Coherent  Volume:  Normal  Mood:  Euthymic  Affect:  Appropriate  Thought Process:  Goal Directed  Orientation:  Full (Time, Place, and Person)  Thought Content:  WDL  Suicidal Thoughts:  No Denies and contracts for safety  Homicidal Thoughts:  No Denies and contracts for safety  Memory:  Immediate;   Good Recent;   Good Remote;   Good  Judgement:  Fair  Insight:  Fair  Psychomotor Activity:  Normal  Concentration:  Good  Recall:  Fair  Akathisia:  NA  Handed:  Right  AIMS (if indicated):     Assets:  Communication Skills Desire for Improvement Resilience  Sleep:      Treatment Plan Summary: See below  Disposition:  -Discharge home and pt will followup with [facility] (he goes there already) OR with ACT Team if we can secure one. -Wait until approximately 11:00AM to send home so that TTS staff can attempt to secure an ACT Team to prevent pt from returning to the ED so often. -Give pt bus pass (from social work)    *Reviewed with Dr. Parke Poisson.   Benjamine Mola, FNP-BC 07/07/2014 09:37 AM  Patient case reviewed with me as above

## 2014-07-07 NOTE — ED Notes (Signed)
Spoke with Tresa EndoKelly, PA-C, that patient's agitation will not allow for lab draws or urine collection at this time. She acknowledges, no new orders at this time.

## 2014-07-07 NOTE — ED Provider Notes (Signed)
And 54 year old male was brought to the ED because of suicidal ideation. He states he has had suicidal ideation for long time. He was found to have a screwdriver in his pocket although he would did not express eyes intended method of suicide. He is very agitated toe with some flight of ideas and paranoid ideation. Screening labs will be obtained and consultation will be obtained with TTS.  Medical screening examination/treatment/procedure(s) were conducted as a shared visit with non-physician practitioner(s) and myself.  I personally evaluated the patient during the encounter.    Taylor Boozeavid Markanthony Gedney, MD 07/07/14 [phone]

## 2014-07-07 NOTE — ED Notes (Addendum)
Per staffing, there is a sitter coming down to sit with the pt. Kaitlyn, EMT at bedside at this time, sitting with patient.

## 2014-07-07 NOTE — ED Provider Notes (Signed)
Patient not seen by me and I was not directly involved in the patient care. Behavioral Health/Psychiatry has evaluated the patient and, as per their note and recommendation, patient was to be discharged. Follow-up recommendations and medications determined by/prescribed by Behavioral Health/Psychiatry.   Gilda Creasehristopher J. Pollina, MD 07/07/14 1005

## 2014-07-07 NOTE — ED Notes (Signed)
Patient requested milk, currently has orange juice at the bedside, as well as sandwich. Informed patient that after orange juice is consumed, milk can be provided. Patient became highly agitated. Left door open with sitter present for safety.

## 2014-07-07 NOTE — ED Provider Notes (Signed)
CSN: [id number]     Arrival date & time 07/07/14  0423 History   First MD Initiated Contact with Patient 07/07/14 [phone]     Chief Complaint  Patient presents with  . Suicidal  . Homicidal    (Consider location/radiation/quality/duration/timing/severity/associated sxs/prior Treatment) HPI Comments: 54 year old male presents to the emergency department for further evaluation for suicidal ideations. Patient states that he has "been dammed to hell since 1977". He states he is a zombie and a vampire and requests that I check his pulse. He states he is having thoughts of killing himself, but will not express a plan. This is different from in triage where patient endorsed to being suicidal and homicidal with a plan to "burn 'em up". He states he likes cocaine. He denies ETOH use. Patient states his symptoms are worse and different since evaluation yesterday. He states he wants some rehab at [facility].  The history is provided by the patient. No language interpreter was used.    Past Medical History  Diagnosis Date  . Diabetes mellitus without complication   . Hypertension   . Schizophrenia   . CHF (congestive heart failure)   . Neuropathy   . Polysubstance abuse   . Cocaine abuse   . Homelessness    History reviewed. No pertinent past surgical history. Family History  Problem Relation Age of Onset  . Hypertension Other   . Diabetes Other    History  Substance Use Topics  . Smoking status: Current Every Day Smoker -- 1.00 packs/day for 20 years    Types: Cigarettes  . Smokeless tobacco: Current User  . Alcohol Use: Yes     Comment: Pt reports "on special occasions"    Review of Systems  Psychiatric/Behavioral: Positive for suicidal ideas, behavioral problems and agitation.  All other systems reviewed and are negative.   Allergies  Haldol  Home Medications   Prior to Admission medications   Medication Sig Start Date End Date Taking? Authorizing Provider  ARIPiprazole (ABILIFY) 5  MG tablet Take 1 tablet (5 mg total) by mouth daily. 07/06/14  Yes Earney Navy, NP  benztropine (COGENTIN) 2 MG tablet Take 1 tablet (2 mg total) by mouth 2 (two) times daily. 06/04/14  Yes Beau Fanny, FNP  cloNIDine (CATAPRES) 0.2 MG tablet Take 0.2 mg by mouth 2 (two) times daily.   Yes Historical Provider, MD  hydrochlorothiazide (HYDRODIURIL) 25 MG tablet Take 1 tablet (25 mg total) by mouth daily. 06/04/14  Yes Beau Fanny, FNP  fluPHENAZine decanoate (PROLIXIN) 25 MG/ML injection Inject 1 mL (25 mg total) into the muscle every 14 (fourteen) days. Patient not taking: Reported on 06/24/2014 06/04/14   Beau Fanny, FNP   BP 181/88 mmHg  Pulse 83  Temp(Src) 97.8 F (36.6 C) (Oral)  Resp 16  Ht  (1.753 m)  Wt 170 lb (77.111 kg)  BMI 25.09 kg/m2  SpO2 100%   Physical Exam  Constitutional: He is oriented to person, place, and time. He appears well-developed and well-nourished. No distress.  Nontoxic/nonseptic appearing. Patient appears disheveled  HENT:  Head: Normocephalic and atraumatic.  Eyes: Conjunctivae and EOM are normal. No scleral icterus.  Neck: Normal range of motion.  Pulmonary/Chest: Effort normal. No respiratory distress.  Musculoskeletal: Normal range of motion.  Neurological: He is alert and oriented to person, place, and time. He exhibits normal muscle tone. Coordination normal.  Skin: Skin is warm and dry. No rash noted. He is not diaphoretic. No erythema. No pallor.  Psychiatric: His affect is angry. His speech is rapid and/or pressured. He is agitated and hyperactive. He expresses suicidal ideation. He expresses no homicidal ideation. He expresses no suicidal plans and no homicidal plans.  Patient fidgeting and agitated. He makes frequent expressions with his hands. His voice escalates over the encounter; rapid and pressured.  Nursing note and vitals reviewed.   ED Course  Procedures (including critical care time) Labs Review Labs Reviewed  CBC   COMPREHENSIVE METABOLIC PANEL  ETHANOL  ACETAMINOPHEN LEVEL  SALICYLATE LEVEL  URINE RAPID DRUG SCREEN (HOSP PERFORMED)    Imaging Review No results found.   EKG Interpretation None      MDM   Final diagnoses:  Agitation  Suicidal ideation    Labs pending for medical clearance; patient currently too agitated for lab draw. TTS consulted to determine disposition.   Filed Vitals:   07/07/14 0438  BP: 181/88  Pulse: 83  Temp: 97.8 F (36.6 C)  TempSrc: Oral  Resp: 16  Height: 5\' 9"  (1.753 m)  Weight: 170 lb (77.111 kg)  SpO2: 100%      Antony MaduraKelly Janyiah Silveri, PA-C 07/07/14 0524  Dione Boozeavid Glick, MD 07/07/14 [phone]

## 2014-07-07 NOTE — ED Notes (Addendum)
Pt is agitated, yelling and shouting profanities at staff. Pt repeatedly asking for milk and crackers, stating "I see you slut, get me two milks!". This RN went to the room to tell the pt that the language he was using was unacceptable, and he told this RN "Get out my face, bitch! You can go to hell!". Sitter at bedside, and door remains open. Pt continues to yell profanity.

## 2014-07-07 NOTE — ED Notes (Signed)
Pt is refusing blood draw. 

## 2014-07-15 ENCOUNTER — Encounter (HOSPITAL_COMMUNITY): Payer: Self-pay | Admitting: *Deleted

## 2014-07-15 ENCOUNTER — Emergency Department (HOSPITAL_COMMUNITY): Payer: Medicare Other

## 2014-07-15 ENCOUNTER — Emergency Department (HOSPITAL_COMMUNITY)
Admission: EM | Admit: 2014-07-15 | Discharge: 2014-07-15 | Disposition: A | Discharge status: Home / Self Care | Payer: Medicare Other | Attending: Emergency Medicine | Admitting: Emergency Medicine

## 2014-07-15 DIAGNOSIS — J069 Acute upper respiratory infection, unspecified: Secondary | ICD-10-CM | POA: Insufficient documentation

## 2014-07-15 DIAGNOSIS — R05 Cough: Secondary | ICD-10-CM

## 2014-07-15 DIAGNOSIS — Z590 Homelessness: Secondary | ICD-10-CM | POA: Insufficient documentation

## 2014-07-15 DIAGNOSIS — Z79899 Other long term (current) drug therapy: Secondary | ICD-10-CM | POA: Insufficient documentation

## 2014-07-15 DIAGNOSIS — Z720 Tobacco use: Secondary | ICD-10-CM | POA: Insufficient documentation

## 2014-07-15 DIAGNOSIS — E119 Type 2 diabetes mellitus without complications: Secondary | ICD-10-CM | POA: Insufficient documentation

## 2014-07-15 DIAGNOSIS — I10 Essential (primary) hypertension: Secondary | ICD-10-CM | POA: Insufficient documentation

## 2014-07-15 DIAGNOSIS — I509 Heart failure, unspecified: Secondary | ICD-10-CM | POA: Insufficient documentation

## 2014-07-15 DIAGNOSIS — F209 Schizophrenia, unspecified: Secondary | ICD-10-CM | POA: Insufficient documentation

## 2014-07-15 DIAGNOSIS — R059 Cough, unspecified: Secondary | ICD-10-CM

## 2014-07-15 DIAGNOSIS — Z8669 Personal history of other diseases of the nervous system and sense organs: Secondary | ICD-10-CM | POA: Insufficient documentation

## 2014-07-15 MED ORDER — ACETAMINOPHEN 500 MG PO TABS
1000.0000 mg | ORAL_TABLET | Freq: Once | ORAL | Status: AC
  Administered 2014-07-15: 1000 mg via ORAL
  Filled 2014-07-15: qty 2

## 2014-07-15 MED ORDER — CEPHALEXIN 500 MG PO CAPS: 500.0000 mg | ORAL_CAPSULE | Freq: Four times a day (QID) | ORAL | Status: DC

## 2014-07-15 NOTE — ED Notes (Signed)
Pt reports having cough, chest congestion and cold symptoms. Pt is homeless and reports being in the cold x 15 days. Also wants a Child psychotherapistsocial worker consult and bus pass.

## 2014-07-15 NOTE — ED Notes (Signed)
PA Kaitlyn at bedside.  

## 2014-07-15 NOTE — ED Notes (Addendum)
Cold sx. [facility] from home. Neuropathy pain in feet. EMS states Pt admitted to smoking crack today. Pt with erratic behavior in waiting area. [employer] present. Pt encouraged to wait for triage

## 2014-07-15 NOTE — ED Notes (Signed)
Social work at bedside.  

## 2014-07-15 NOTE — Progress Notes (Signed)
CSW spoke with pt per his request.  Pt requesting an signed FL2 so that he can move into a GH.  CSW informed pt that he cannot go to a GH, RH, ALF, [facility]CH, etc because he does not have Medicaid, only Medicare.  Pt advised to follow up with [place] DSS and apply for "special assistance" Medicaid that would help him to pay for that LOC.  Pt then became agitated, asking where he could go with Medicare only, that [facility] Pt states that he will not go back on the streets to do crack cocaine and that we needed to find him BH bed.  Pt denies SI/HI, states that he is depressed and needs rehab.  Medical team informed.
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
## 2014-07-15 NOTE — Discharge Instructions (Signed)
Take keflex as directed for your UTI. Refer to attached documents for more information. Return to the ED with worsening or concerning symptoms.

## 2014-07-23 NOTE — Discharge Summary (Signed)
[facility] OBS UNIT DISCHARGE SUMMARY  *UNABLE TO OBTAIN; PT NOT PRESENT AND REFUSED ALL MEDICAL TREATMENT BEFORE ANY MEDICAL PROVIDER COULD EVALUATE HIM*   This NP has had no contact with this pt or his medical chart [facility]. This documentation is solely for the purpose of chart completion. I have no knowledge of this case aside from a chart review. Discussed this case with Dr. Marlyne BeardsJennings and necessity of having chart complete and this will be saved to the medical record only for that purpose.  Pt left before he could be evaluated, Against Medical Advice (AMA).   Pt arrived after admission cutoff and end of dayshift NP on 11/20, [facility], [facility] Nanine MeansJamison Lord[facility] That NP evaluated pt and wrote a note stating he would be discharged. Later, pt [facility] by same NP instead of being discharged. This pt has known malingering behavior and approximately 30 visits over the last 6 months. He is well-known to many providers both in the ED and [facility]. He is known to this NP from prior visits, but not during this encounter.  Pt physically arrived in the OBS Unit at approximately 4:42PM, documented by Manus RuddSuzanne Beck, RN. His admission criteria was that he was having some thoughts of killing someone. At that time, he was denying suicidal thoughts. He had no specific plan for either and was denying psychosis.  At 3:37AM (05/21/14), a note is on the chart from Glenice LaineNkiruka Ibekwe, RN, stating that the pt was requesting an extremely large amount of food, snacks, and milk, and was also complaining of very strong malodorous flatulence, which was also disturbing to the staff and other patients. Due to patient's diabetic state and GI upset, nursing staff advised that he would not be allowed to have more snacks for the time being. Pt became irate, demanding, and asked that someone be  contacted to write medical orders for snacks. Pt had a headache at this time, received Tylenol, stated that it was effective. Pt refused sleep medication at this time stating he sleeps well. Nanine MeansJamison Lord, NP was notified of pt's irate mood. No action was taken at this time.  At 7:55AM, there is a note on the chart from Everrett Coombeana Owen, [place], stating that the pt is requesting multiple breakfast trays and was told that his diabetic state is reason to avoid excess food at this time. Pt became very irate and was cursing at staff members demanding to leave.  At 8:07AM, there is a note on the chart from Mordecai RasmussenKimberly Maggio, RN where the pt continued to be belligerent and cursing at staff. He was reportedly escorted from the property with security after being given his belongings and signing an AMA form. He denied Benard HalstedShi, [place], and AVH when he left. Pt was out of the building before this NP's shift began. The night-shift call NP was responsible for this pt during night shift and is the only provider who had contact with his chart. However, this pt would have been this NP's responsibility to do an H&P and DC summary had he been in the building when my shift was occurring.   Beau FannyWithrow, Maisley Hainsworth C, FNP-BC 05/21/2014        04:08PM

## 2014-07-23 NOTE — H&P (Signed)
[facility] OBS UNIT H&P  *UNABLE TO OBTAIN; PT NOT PRESENT AND REFUSED ALL MEDICAL TREATMENT BEFORE ANY MEDICAL PROVIDER COULD EVALUATE HIM*  This NP has had no contact with this pt or his medical chart during [facility]. This documentation is solely for the purpose of chart completion. I have no knowledge of this case aside from a chart review. Discussed this case with Dr. Marlyne BeardsJennings and necessity of having chart complete and this will be saved to the medical record only for that purpose.  Pt left before he could be evaluated, Against Medical Advice (AMA).   Pt arrived after admission cutoff and end of dayshift NP on 11/20, [facility], accepted by Nanine MeansJamison Lord, NP [facility]. That NP evaluated pt and wrote a note stating he would be discharged. Later, pt was [facility] by same NP instead of being discharged. This pt has known malingering behavior and approximately 30 visits over the last 6 months. He is well-known to many providers [facility] the ED [facility]. He is known to this NP from prior visits, but not during this encounter.  Pt physically arrived in the OBS Unit at approximately 4:42PM, documented by Manus RuddSuzanne Beck, RN. His admission criteria was that he was having some thoughts of killing someone. At that time, he was denying suicidal thoughts. He had no specific plan for either and was denying psychosis.  At 3:37AM (05/21/14), a note is on the chart from Glenice LaineNkiruka Ibekwe, RN, stating that the pt was requesting an extremely large amount of food, snacks, and milk, and was also complaining of very strong malodorous flatulence, which was also disturbing to the staff and other patients. Due to patient's diabetic state and GI upset, nursing staff advised that he would not be allowed to have more snacks for the time being. Pt became irate, demanding, and asked that someone be contacted to write medical  orders for snacks. Pt had a headache at this time, received Tylenol, stated that it was effective. Pt refused sleep medication at this time stating he sleeps well. Nanine MeansJamison Lord, NP was notified of pt's irate mood. No action was taken at this time.  At 7:55AM, there is a note on the chart from Everrett Coombeana Owen, [place], stating that the pt is requesting multiple breakfast trays and was told that his diabetic state is reason to avoid excess food at this time. Pt became very irate and was cursing at staff members demanding to leave.  At 8:07AM, there is a note on the chart from Mordecai RasmussenKimberly Maggio, RN where the pt continued to be belligerent and cursing at staff. He was reportedly escorted from the property with security after being given his belongings and signing an AMA form. He denied Benard HalstedShi, [place], and AVH when he left. Pt was out of the building before this NP's shift began. The night-shift call NP was responsible for this pt during night shift and is the only provider who had contact with his chart. However, this pt would have been this NP's responsibility to do an H&P and DC summary had he been in the building when my shift was occurring.   Beau FannyWithrow, John C, FNP-BC 05/21/2014        03:47PM

## 2014-07-28 ENCOUNTER — Emergency Department (HOSPITAL_COMMUNITY): Payer: Medicare Other

## 2014-07-28 ENCOUNTER — Inpatient Hospital Stay (HOSPITAL_COMMUNITY)
Admission: EM | Admit: 2014-07-28 | Discharge: 2014-07-30 | DRG: 917 | Disposition: A | Discharge status: Home / Self Care | Payer: Medicare Other | Attending: Internal Medicine | Admitting: Internal Medicine

## 2014-07-28 ENCOUNTER — Encounter (HOSPITAL_COMMUNITY): Payer: Self-pay | Admitting: Emergency Medicine

## 2014-07-28 DIAGNOSIS — I509 Heart failure, unspecified: Secondary | ICD-10-CM

## 2014-07-28 DIAGNOSIS — R059 Cough, unspecified: Secondary | ICD-10-CM

## 2014-07-28 DIAGNOSIS — E119 Type 2 diabetes mellitus without complications: Secondary | ICD-10-CM

## 2014-07-28 DIAGNOSIS — F200 Paranoid schizophrenia: Secondary | ICD-10-CM | POA: Diagnosis present

## 2014-07-28 DIAGNOSIS — I5031 Acute diastolic (congestive) heart failure: Secondary | ICD-10-CM | POA: Diagnosis present

## 2014-07-28 DIAGNOSIS — Z9119 Patient's noncompliance with other medical treatment and regimen: Secondary | ICD-10-CM | POA: Diagnosis present

## 2014-07-28 DIAGNOSIS — R339 Retention of urine, unspecified: Secondary | ICD-10-CM | POA: Insufficient documentation

## 2014-07-28 DIAGNOSIS — R0902 Hypoxemia: Secondary | ICD-10-CM

## 2014-07-28 DIAGNOSIS — R05 Cough: Secondary | ICD-10-CM | POA: Diagnosis not present

## 2014-07-28 DIAGNOSIS — I10 Essential (primary) hypertension: Secondary | ICD-10-CM | POA: Diagnosis present

## 2014-07-28 DIAGNOSIS — I110 Hypertensive heart disease with heart failure: Secondary | ICD-10-CM | POA: Diagnosis present

## 2014-07-28 DIAGNOSIS — Z590 Homelessness: Secondary | ICD-10-CM

## 2014-07-28 DIAGNOSIS — T405X4A Poisoning by cocaine, undetermined, initial encounter: Principal | ICD-10-CM | POA: Diagnosis present

## 2014-07-28 DIAGNOSIS — I351 Nonrheumatic aortic (valve) insufficiency: Secondary | ICD-10-CM | POA: Diagnosis present

## 2014-07-28 DIAGNOSIS — F1410 Cocaine abuse, uncomplicated: Secondary | ICD-10-CM | POA: Diagnosis present

## 2014-07-28 DIAGNOSIS — F17210 Nicotine dependence, cigarettes, uncomplicated: Secondary | ICD-10-CM | POA: Diagnosis present

## 2014-07-28 DIAGNOSIS — F1290 Cannabis use, unspecified, uncomplicated: Secondary | ICD-10-CM | POA: Diagnosis present

## 2014-07-28 DIAGNOSIS — Z794 Long term (current) use of insulin: Secondary | ICD-10-CM

## 2014-07-28 DIAGNOSIS — R32 Unspecified urinary incontinence: Secondary | ICD-10-CM | POA: Diagnosis present

## 2014-07-28 DIAGNOSIS — F39 Unspecified mood [affective] disorder: Secondary | ICD-10-CM | POA: Diagnosis present

## 2014-07-28 DIAGNOSIS — I43 Cardiomyopathy in diseases classified elsewhere: Secondary | ICD-10-CM | POA: Diagnosis present

## 2014-07-28 LAB — CBC WITH DIFFERENTIAL/PLATELET
Basophils Absolute: 0 10*3/uL (ref 0.0–0.1)
Basophils Relative: 0 % (ref 0–1)
Eosinophils Absolute: 0.1 10*3/uL (ref 0.0–0.7)
Eosinophils Relative: 1 % (ref 0–5)
HEMATOCRIT: 34.8 % — AB (ref 39.0–52.0)
Hemoglobin: 11 g/dL — ABNORMAL LOW (ref 13.0–17.0)
LYMPHS ABS: 1 10*3/uL (ref 0.7–4.0)
Lymphocytes Relative: 11 % — ABNORMAL LOW (ref 12–46)
MCH: 28.7 pg (ref 26.0–34.0)
MCHC: 31.6 g/dL (ref 30.0–36.0)
MCV: 90.9 fL (ref 78.0–100.0)
MONO ABS: 0.7 10*3/uL (ref 0.1–1.0)
Monocytes Relative: 9 % (ref 3–12)
Neutro Abs: 6.7 10*3/uL (ref 1.7–7.7)
Neutrophils Relative %: 79 % — ABNORMAL HIGH (ref 43–77)
PLATELETS: 310 10*3/uL (ref 150–400)
RBC: 3.83 MIL/uL — ABNORMAL LOW (ref 4.22–5.81)
RDW: 13.8 % (ref 11.5–15.5)
WBC: 8.4 10*3/uL (ref 4.0–10.5)

## 2014-07-28 LAB — URINALYSIS, ROUTINE W REFLEX MICROSCOPIC
Bilirubin Urine: NEGATIVE
Glucose, UA: NEGATIVE mg/dL
Hgb urine dipstick: NEGATIVE
Ketones, ur: NEGATIVE mg/dL
Leukocytes, UA: NEGATIVE
Nitrite: NEGATIVE
PH: 7 (ref 5.0–8.0)
PROTEIN: 30 mg/dL — AB
Specific Gravity, Urine: 1.013 (ref 1.005–1.030)
Urobilinogen, UA: 0.2 mg/dL (ref 0.0–1.0)

## 2014-07-28 LAB — COMPREHENSIVE METABOLIC PANEL
ALK PHOS: 152 U/L — AB (ref 39–117)
ALT: 91 U/L — ABNORMAL HIGH (ref 0–53)
AST: 62 U/L — AB (ref 0–37)
Albumin: 3.1 g/dL — ABNORMAL LOW (ref 3.5–5.2)
Anion gap: 7 (ref 5–15)
BUN: 14 mg/dL (ref 6–23)
CO2: 30 mmol/L (ref 19–32)
CREATININE: 0.97 mg/dL (ref 0.50–1.35)
Calcium: 9.3 mg/dL (ref 8.4–10.5)
Chloride: 105 mmol/L (ref 96–112)
Glucose, Bld: 180 mg/dL — ABNORMAL HIGH (ref 70–99)
Potassium: 3.5 mmol/L (ref 3.5–5.1)
SODIUM: 142 mmol/L (ref 135–145)
Total Bilirubin: 0.5 mg/dL (ref 0.3–1.2)
Total Protein: 6.2 g/dL (ref 6.0–8.3)

## 2014-07-28 LAB — RAPID URINE DRUG SCREEN, HOSP PERFORMED
AMPHETAMINES: NOT DETECTED
BENZODIAZEPINES: NOT DETECTED
Barbiturates: NOT DETECTED
COCAINE: POSITIVE — AB
Opiates: NOT DETECTED
Tetrahydrocannabinol: NOT DETECTED

## 2014-07-28 LAB — URINE MICROSCOPIC-ADD ON

## 2014-07-28 LAB — CBG MONITORING, ED: Glucose-Capillary: 170 mg/dL — ABNORMAL HIGH (ref 70–99)

## 2014-07-28 LAB — ETHANOL: Alcohol, Ethyl (B): <5 mg/dL (ref 0–9)

## 2014-07-28 MED ORDER — SODIUM CHLORIDE 0.9 % IV BOLUS (SEPSIS)
1000.0000 mL | Freq: Once | INTRAVENOUS | Status: AC
  Administered 2014-07-28: 1000 mL via INTRAVENOUS

## 2014-07-28 MED ORDER — ACETAMINOPHEN 325 MG PO TABS
650.0000 mg | ORAL_TABLET | Freq: Once | ORAL | Status: AC
  Administered 2014-07-28: 650 mg via ORAL
  Filled 2014-07-28: qty 2

## 2014-07-28 NOTE — ED Notes (Signed)
Pt. arrived with EMS from street reports frequent urinary incontinence for several months , pt. stated he smoked "crack " today , denies suicidal ideation / refused detox .

## 2014-07-28 NOTE — ED Notes (Signed)
Post residual volume 444

## 2014-07-28 NOTE — ED Notes (Signed)
Pt o2 stats dropped to 87% on room air, pt placed on 2L o2, stats came to 98%

## 2014-07-28 NOTE — ED Provider Notes (Signed)
CSN: [id number]     Arrival date & time 07/28/14  1954 History   First MD Initiated Contact with Patient 07/28/14 2150     Chief Complaint  Patient presents with  . Urinary Incontinence     (Consider location/radiation/quality/duration/timing/severity/associated sxs/prior Treatment) HPI  54 year old male presents with urinary incontinence for the past 2 months. Patient also endorses fecal incontinence during this time as well. However he also states that he had a normal bowel movement while in the waiting room under his uncontrolled. No constipation. Ace and denies any dysuria but states he was given Keflex several weeks ago but never got it filled for a possible UTI. Does not have any burning. States he pees multiple times a day but sometimes goes on himself. Also feels like his belly button is swollen but denies abdominal pain. He frequently uses cocaine, last used last night. Denies any back pain or fevers. Has been having cough and shortness of breath over the last few days. Has felt like his legs have been swelling and is having pain from diabetic neuropathy.  Past Medical History  Diagnosis Date  . Diabetes mellitus without complication   . Hypertension   . Schizophrenia   . CHF (congestive heart failure)   . Neuropathy   . Polysubstance abuse   . Cocaine abuse   . Homelessness    History reviewed. No pertinent past surgical history. Family History  Problem Relation Age of Onset  . Hypertension Other   . Diabetes Other    History  Substance Use Topics  . Smoking status: Current Every Day Smoker -- 1.00 packs/day for 20 years    Types: Cigarettes  . Smokeless tobacco: Current User  . Alcohol Use: Yes     Comment: Pt reports "on special occasions"    Review of Systems  Respiratory: Positive for cough and shortness of breath.   Cardiovascular: Positive for leg swelling. Negative for chest pain.  Gastrointestinal: Positive for diarrhea. Negative for nausea, vomiting and  abdominal pain.  Genitourinary: Positive for frequency. Negative for dysuria.  Musculoskeletal: Negative for back pain.  Neurological: Negative for weakness.  All other systems reviewed and are negative.     Allergies  Haldol  Home Medications   Prior to Admission medications   Medication Sig Start Date End Date Taking? Authorizing Provider  ARIPiprazole (ABILIFY) 5 MG tablet Take 1 tablet (5 mg total) by mouth daily. 07/06/14   Earney NavyJosephine C Onuoha, NP  benztropine (COGENTIN) 2 MG tablet Take 1 tablet (2 mg total) by mouth 2 (two) times daily. 06/04/14   Beau FannyJohn C Withrow, FNP  cephALEXin (KEFLEX) 500 MG capsule Take 1 capsule (500 mg total) by mouth 4 (four) times daily. 07/15/14   Kaitlyn Szekalski, PA-C  cloNIDine (CATAPRES) 0.2 MG tablet Take 0.2 mg by mouth 2 (two) times daily.    Historical Provider, MD  fluPHENAZine decanoate (PROLIXIN) 25 MG/ML injection Inject 1 mL (25 mg total) into the muscle every 14 (fourteen) days. Patient not taking: Reported on 06/24/2014 06/04/14   Beau FannyJohn C Withrow, FNP  hydrochlorothiazide (HYDRODIURIL) 25 MG tablet Take 1 tablet (25 mg total) by mouth daily. 06/04/14   Everardo AllJohn C Withrow, FNP   BP 168/80 mmHg  Pulse 92  Temp(Src) 98.4 F (36.9 C) (Oral)  Resp 22  Ht 5\' 8"  (1.727 m)  Wt 168 lb (76.204 kg)  BMI 25.55 kg/m2  SpO2 97% Physical Exam  Constitutional: He is oriented to person, place, and time. He appears well-developed and well-nourished.  HENT:  Head: Normocephalic and atraumatic.  Right Ear: External ear normal.  Left Ear: External ear normal.  Nose: Nose normal.  Eyes: Right eye exhibits no discharge. Left eye exhibits no discharge.  Neck: Neck supple.  Cardiovascular: Normal rate, regular rhythm, normal heart sounds and intact distal pulses.   Pulmonary/Chest: Effort normal and breath sounds normal.  Abdominal: Soft. He exhibits no distension. There is no tenderness.  Genitourinary: Rectum normal and penis normal. Rectal exam shows no  external hemorrhoid, no internal hemorrhoid, no mass, no tenderness and anal tone normal. Circumcised. No penile tenderness. No discharge found.  Musculoskeletal: He exhibits no edema.  Neurological: He is alert and oriented to person, place, and time.  Normal strength in all 4 extremities. Normal gross sensation  Skin: Skin is warm and dry.  Nursing note and vitals reviewed.   ED Course  Procedures (including critical care time) Labs Review Labs Reviewed  URINALYSIS, ROUTINE W REFLEX MICROSCOPIC - Abnormal; Notable for the following:    Protein, ur 30 (*)    All other components within normal limits  URINE RAPID DRUG SCREEN (HOSP PERFORMED) - Abnormal; Notable for the following:    Cocaine POSITIVE (*)    All other components within normal limits  COMPREHENSIVE METABOLIC PANEL - Abnormal; Notable for the following:    Glucose, Bld 180 (*)    Albumin 3.1 (*)    AST 62 (*)    ALT 91 (*)    Alkaline Phosphatase 152 (*)    All other components within normal limits  CBC WITH DIFFERENTIAL/PLATELET - Abnormal; Notable for the following:    RBC 3.83 (*)    Hemoglobin 11.0 (*)    HCT 34.8 (*)    Neutrophils Relative % 79 (*)    Lymphocytes Relative 11 (*)    All other components within normal limits  CBG MONITORING, ED - Abnormal; Notable for the following:    Glucose-Capillary 170 (*)    All other components within normal limits  URINE CULTURE  ETHANOL  URINE MICROSCOPIC-ADD ON  BRAIN NATRIURETIC PEPTIDE  TROPONIN I    Imaging Review Dg Chest 2 View  07/28/2014   CLINICAL DATA:  Chest pain abdomen pain dyspnea  EXAM: CHEST  2 VIEW  COMPARISON:  07/15/2014  FINDINGS: There is generalized interstitial coarsening which is worsened from 07/15/2014. This could represent interstitial infiltrates or interstitial fluid. No airspace consolidation is evident. There are no pleural effusions.  IMPRESSION: Interstitial fluid versus interstitial infiltrates, new/worsened from 07/15/2014.    Electronically Signed   By: Ellery Plunk M.D.   On: 07/28/2014 23:05   Dg Abd 2 Views  07/28/2014   CLINICAL DATA:  Abdominal pain, chest pain, shortness of breath, cough.  EXAM: ABDOMEN - 2 VIEW  COMPARISON:  04/01/2014  FINDINGS: Upright view is limited by motion artifact. Diffusely stool-filled colon. No small or large bowel distention. No free intra-abdominal air. No abnormal air-fluid levels. No radiopaque stones. Degenerative changes in the spine.  IMPRESSION: Nonobstructive bowel gas pattern with stool-filled colon.   Electronically Signed   By: Burman Nieves M.D.   On: 07/28/2014 23:06     EKG Interpretation   Date/Time:  Thursday July 28 2014 23:33:34 EST Ventricular Rate:  95 PR Interval:  146 QRS Duration: 70 QT Interval:  358 QTC Calculation: 450 R Axis:   85 Text Interpretation:  Normal sinus rhythm Anteroseptal infarct, old T wave  inversions seem deeper and more prominent compared to Nov 2015 Confirmed  by  Dorrell Mitcheltree  MD, Saylah Ketner [phone]) on 07/28/2014 11:36:33 PM      MDM   Final diagnoses:  Cough  Urinary retention  Hypoxia    Patient does not appear to have an acute infection. His urinalysis shows no leukocytes or nitrites. He does have a post void residual of over 400 mL. I discussed that this means patient needs a Foley catheter, he declines. He does not appear intoxicated at this time, does have a history of schizophrenia but at this point I believe he understands what I am saying and understands why I feel like he needs a catheter. His main reason for refusing appears to be clear of pain. I offered him pain and anxiety medicines but he declines. I discussed this could result in damage to his bladder, kidneys, and cause permanent damage elsewhere. He understands these risks and still declines. On another note when vitals were taken he does have an oxygen saturation of 87% on room air. He does complain of cough and shortness of breath. Initially started on fluids  and this was stopped after x-ray shows pulmonary edema. He only received about 250 mL's. Patient has a prior history of CHF but most recent echo 5-6 years ago shows EF of 60%. Likely this is related to his cocaine abuse. Does have increased T-wave inversions, normal troponin. We'll need admission to hospital hypoxia and O2 requirement, will also probably need social work given he is homeless.    Audree Camel, MD 07/29/14 [phone]

## 2014-07-29 ENCOUNTER — Encounter (HOSPITAL_COMMUNITY): Payer: Self-pay | Admitting: Surgery

## 2014-07-29 DIAGNOSIS — I5031 Acute diastolic (congestive) heart failure: Secondary | ICD-10-CM | POA: Diagnosis present

## 2014-07-29 DIAGNOSIS — F1410 Cocaine abuse, uncomplicated: Secondary | ICD-10-CM | POA: Diagnosis present

## 2014-07-29 DIAGNOSIS — F200 Paranoid schizophrenia: Secondary | ICD-10-CM | POA: Diagnosis present

## 2014-07-29 DIAGNOSIS — F39 Unspecified mood [affective] disorder: Secondary | ICD-10-CM | POA: Diagnosis present

## 2014-07-29 DIAGNOSIS — I43 Cardiomyopathy in diseases classified elsewhere: Secondary | ICD-10-CM | POA: Diagnosis present

## 2014-07-29 DIAGNOSIS — T405X4A Poisoning by cocaine, undetermined, initial encounter: Secondary | ICD-10-CM | POA: Diagnosis present

## 2014-07-29 DIAGNOSIS — I351 Nonrheumatic aortic (valve) insufficiency: Secondary | ICD-10-CM | POA: Diagnosis present

## 2014-07-29 DIAGNOSIS — Z9119 Patient's noncompliance with other medical treatment and regimen: Secondary | ICD-10-CM | POA: Diagnosis present

## 2014-07-29 DIAGNOSIS — I110 Hypertensive heart disease with heart failure: Secondary | ICD-10-CM | POA: Diagnosis present

## 2014-07-29 DIAGNOSIS — E119 Type 2 diabetes mellitus without complications: Secondary | ICD-10-CM

## 2014-07-29 DIAGNOSIS — R32 Unspecified urinary incontinence: Secondary | ICD-10-CM | POA: Diagnosis present

## 2014-07-29 DIAGNOSIS — F17210 Nicotine dependence, cigarettes, uncomplicated: Secondary | ICD-10-CM | POA: Diagnosis present

## 2014-07-29 DIAGNOSIS — I10 Essential (primary) hypertension: Secondary | ICD-10-CM

## 2014-07-29 DIAGNOSIS — F1290 Cannabis use, unspecified, uncomplicated: Secondary | ICD-10-CM | POA: Diagnosis present

## 2014-07-29 DIAGNOSIS — Z590 Homelessness: Secondary | ICD-10-CM | POA: Diagnosis not present

## 2014-07-29 DIAGNOSIS — F1994 Other psychoactive substance use, unspecified with psychoactive substance-induced mood disorder: Secondary | ICD-10-CM

## 2014-07-29 DIAGNOSIS — R0902 Hypoxemia: Secondary | ICD-10-CM | POA: Insufficient documentation

## 2014-07-29 DIAGNOSIS — F209 Schizophrenia, unspecified: Secondary | ICD-10-CM

## 2014-07-29 DIAGNOSIS — R05 Cough: Secondary | ICD-10-CM | POA: Diagnosis present

## 2014-07-29 DIAGNOSIS — I509 Heart failure, unspecified: Secondary | ICD-10-CM

## 2014-07-29 DIAGNOSIS — R339 Retention of urine, unspecified: Secondary | ICD-10-CM | POA: Insufficient documentation

## 2014-07-29 LAB — PROTIME-INR
INR: 1.02 (ref 0.00–1.49)
Prothrombin Time: 13.6 seconds (ref 11.6–15.2)

## 2014-07-29 LAB — COMPREHENSIVE METABOLIC PANEL
ALK PHOS: 127 U/L — AB (ref 39–117)
ALT: 78 U/L — AB (ref 0–53)
AST: 48 U/L — AB (ref 0–37)
Albumin: 3 g/dL — ABNORMAL LOW (ref 3.5–5.2)
Anion gap: 4 — ABNORMAL LOW (ref 5–15)
BILIRUBIN TOTAL: 0.6 mg/dL (ref 0.3–1.2)
BUN: 15 mg/dL (ref 6–23)
CALCIUM: 9.4 mg/dL (ref 8.4–10.5)
CHLORIDE: 106 mmol/L (ref 96–112)
CO2: 32 mmol/L (ref 19–32)
Creatinine, Ser: 1.01 mg/dL (ref 0.50–1.35)
GFR, EST NON AFRICAN AMERICAN: 83 mL/min — AB (ref >90)
Glucose, Bld: 187 mg/dL — ABNORMAL HIGH (ref 70–99)
Potassium: 3.6 mmol/L (ref 3.5–5.1)
SODIUM: 142 mmol/L (ref 135–145)
TOTAL PROTEIN: 5.6 g/dL — AB (ref 6.0–8.3)

## 2014-07-29 LAB — CBC
HCT: 31.4 % — ABNORMAL LOW (ref 39.0–52.0)
Hemoglobin: 9.9 g/dL — ABNORMAL LOW (ref 13.0–17.0)
MCH: 28.3 pg (ref 26.0–34.0)
MCHC: 31.5 g/dL (ref 30.0–36.0)
MCV: 89.7 fL (ref 78.0–100.0)
Platelets: 297 10*3/uL (ref 150–400)
RBC: 3.5 MIL/uL — ABNORMAL LOW (ref 4.22–5.81)
RDW: 13.9 % (ref 11.5–15.5)
WBC: 8.5 10*3/uL (ref 4.0–10.5)

## 2014-07-29 LAB — BRAIN NATRIURETIC PEPTIDE: B Natriuretic Peptide: 225.7 pg/mL — ABNORMAL HIGH (ref 0.0–100.0)

## 2014-07-29 LAB — TROPONIN I
TROPONIN I: 0.04 ng/mL — AB (ref <0.031)
Troponin I: 0.03 ng/mL (ref <0.031)
Troponin I: 0.04 ng/mL — ABNORMAL HIGH (ref <0.031)
Troponin I: 0.03 ng/mL (ref <0.031)

## 2014-07-29 LAB — TSH: TSH: 1.174 u[IU]/mL (ref 0.350–4.500)

## 2014-07-29 MED ORDER — ACETAMINOPHEN 650 MG RE SUPP: 650.0000 mg | Freq: Four times a day (QID) | RECTAL | Status: DC | PRN

## 2014-07-29 MED ORDER — ACETAMINOPHEN 325 MG PO TABS
650.0000 mg | ORAL_TABLET | Freq: Four times a day (QID) | ORAL | Status: DC | PRN
  Administered 2014-07-29 – 2014-07-30 (×3): 650 mg via ORAL
  Filled 2014-07-29 (×4): qty 2

## 2014-07-29 MED ORDER — LORAZEPAM 2 MG/ML IJ SOLN: 1.0000 mg | Freq: Four times a day (QID) | INTRAMUSCULAR | Status: DC | PRN

## 2014-07-29 MED ORDER — PNEUMOCOCCAL VAC POLYVALENT 25 MCG/0.5ML IJ INJ
0.5000 mL | INJECTION | INTRAMUSCULAR | Status: DC
  Filled 2014-07-29: qty 0.5

## 2014-07-29 MED ORDER — HYDRALAZINE HCL 20 MG/ML IJ SOLN: 10.0000 mg | INTRAMUSCULAR | Status: DC | PRN

## 2014-07-29 MED ORDER — ARIPIPRAZOLE 5 MG PO TABS
5.0000 mg | ORAL_TABLET | Freq: Every day | ORAL | Status: DC
  Filled 2014-07-29: qty 1

## 2014-07-29 MED ORDER — ENOXAPARIN SODIUM 40 MG/0.4ML ~~LOC~~ SOLN
40.0000 mg | SUBCUTANEOUS | Status: DC
  Administered 2014-07-29: 40 mg via SUBCUTANEOUS
  Filled 2014-07-29 (×2): qty 0.4

## 2014-07-29 MED ORDER — INFLUENZA VAC SPLIT QUAD 0.5 ML IM SUSY
0.5000 mL | PREFILLED_SYRINGE | INTRAMUSCULAR | Status: DC
  Filled 2014-07-29: qty 0.5

## 2014-07-29 MED ORDER — TAMSULOSIN HCL 0.4 MG PO CAPS
0.4000 mg | ORAL_CAPSULE | Freq: Every day | ORAL | Status: DC
  Administered 2014-07-29 (×2): 0.4 mg via ORAL
  Filled 2014-07-29 (×3): qty 1

## 2014-07-29 MED ORDER — ARIPIPRAZOLE 5 MG PO TABS
5.0000 mg | ORAL_TABLET | Freq: Two times a day (BID) | ORAL | Status: DC
  Administered 2014-07-29 – 2014-07-30 (×3): 5 mg via ORAL
  Filled 2014-07-29 (×4): qty 1

## 2014-07-29 MED ORDER — FUROSEMIDE 10 MG/ML IJ SOLN
40.0000 mg | Freq: Once | INTRAMUSCULAR | Status: AC
  Administered 2014-07-29: 40 mg via INTRAVENOUS
  Filled 2014-07-29 (×2): qty 4

## 2014-07-29 MED ORDER — ONDANSETRON HCL 4 MG/2ML IJ SOLN: 4.0000 mg | Freq: Four times a day (QID) | INTRAMUSCULAR | Status: DC | PRN

## 2014-07-29 MED ORDER — BENZTROPINE MESYLATE 1 MG PO TABS
1.0000 mg | ORAL_TABLET | Freq: Two times a day (BID) | ORAL | Status: DC
  Administered 2014-07-29 – 2014-07-30 (×3): 1 mg via ORAL
  Filled 2014-07-29 (×4): qty 1

## 2014-07-29 MED ORDER — LORAZEPAM 1 MG PO TABS
1.0000 mg | ORAL_TABLET | Freq: Four times a day (QID) | ORAL | Status: DC | PRN
  Administered 2014-07-29 – 2014-07-30 (×3): 1 mg via ORAL
  Filled 2014-07-29 (×3): qty 1

## 2014-07-29 MED ORDER — FUROSEMIDE 10 MG/ML IJ SOLN
40.0000 mg | Freq: Every day | INTRAMUSCULAR | Status: DC
  Administered 2014-07-29 – 2014-07-30 (×2): 40 mg via INTRAVENOUS
  Filled 2014-07-29: qty 4

## 2014-07-29 MED ORDER — ONDANSETRON HCL 4 MG PO TABS: 4.0000 mg | ORAL_TABLET | Freq: Four times a day (QID) | ORAL | Status: DC | PRN

## 2014-07-29 MED ORDER — CLONIDINE HCL 0.2 MG PO TABS
0.2000 mg | ORAL_TABLET | Freq: Two times a day (BID) | ORAL | Status: DC
  Administered 2014-07-29 – 2014-07-30 (×4): 0.2 mg via ORAL
  Filled 2014-07-29 (×5): qty 1

## 2014-07-29 NOTE — H&P (Signed)
[facility] History and Physical  Patient: [name]  MRN: [id number]  DOB: [birth year]  DOS: the patient was seen and examined on 07/29/2014 PCP: No PCP Per Patient  Chief Complaint: Urinary incontinence  HPI: [name] is a 54 y.o. male with Past medical history of diabetes mellitus, hypertension, paranoid schizophrenia, CHF, substance abuse with cocaine, homelessness with recurrent ER visits. The patient presented with complaints of urinary incontinence. Juliette Alcide time of my evaluation the patient had multitudes of complaint. Patient numbness of shortness of breath, abdominal pain, constipation, frequency of urination, shortness of breath and cough. He also mentions he has noted swelling of his legs. He mentions she has been using cocaine for the whole day and uses it on a daily basis and does not have any plan to quit. He also smokes cigarettes on a daily basis. Denies any alcohol abuse. Patient tells me that somebody is trying to make him sleep by witchcraft.  The patient is coming from home. And at his baseline independent for most of his ADL.  Review of Systems: as mentioned in the history of present illness.  A Comprehensive review of the other systems is negative.  Past Medical History  Diagnosis Date  . Diabetes mellitus without complication   . Hypertension   . Schizophrenia   . CHF (congestive heart failure)   . Neuropathy   . Polysubstance abuse   . Cocaine abuse   . Homelessness    History reviewed. No pertinent past surgical history. Social History:  reports that he has been smoking Cigarettes.  He has a 20 pack-year smoking history. He uses smokeless tobacco. He reports that he drinks alcohol. He reports that he uses illicit drugs ("Crack" cocaine, Cocaine, and Marijuana) about 7 times per week.  Allergies  Allergen Reactions  . Haldol [Haloperidol] Other (See Comments)    Muscle spasms, loss of voluntary movement. However, pt has taken  Thorazine on multiple occasions with no adverse effects.     Family History  Problem Relation Age of Onset  . Hypertension Other   . Diabetes Other     Prior to Admission medications   Medication Sig Start Date End Date Taking? Authorizing Provider  ARIPiprazole (ABILIFY) 5 MG tablet Take 1 tablet (5 mg total) by mouth daily. 07/06/14  Yes Earney Navy, NP  cloNIDine (CATAPRES) 0.2 MG tablet Take 0.2 mg by mouth 2 (two) times daily.   Yes Historical Provider, MD  benztropine (COGENTIN) 2 MG tablet Take 1 tablet (2 mg total) by mouth 2 (two) times daily. Patient not taking: Reported on 07/29/2014 06/04/14   Beau Fanny, FNP  cephALEXin (KEFLEX) 500 MG capsule Take 1 capsule (500 mg total) by mouth 4 (four) times daily. Patient not taking: Reported on 07/29/2014 07/15/14   Emilia Beck, PA-C  fluPHENAZine decanoate (PROLIXIN) 25 MG/ML injection Inject 1 mL (25 mg total) into the muscle every 14 (fourteen) days. Patient not taking: Reported on 06/24/2014 06/04/14   Beau Fanny, FNP  hydrochlorothiazide (HYDRODIURIL) 25 MG tablet Take 1 tablet (25 mg total) by mouth daily. Patient not taking: Reported on 07/29/2014 06/04/14   Beau Fanny, FNP    Physical Exam: Filed Vitals:   07/29/14 0200 07/29/14 0215 07/29/14 0307 07/29/14 0310  BP: 135/67 148/71  145/75  Pulse: 105 105  93  Temp:    98.6 F (37 C)  TempSrc:    Oral  Resp:      Height:    (1.727 m)  Weight:   78.9 kg (173 lb 15.1 oz)   SpO2: 95% 94%  92%    General: Alert, Awake and Oriented to Time, Place and Person. Appear in mild distress Eyes: PERRL ENT: Oral Mucosa clear moist. Neck: mild JVD Cardiovascular: S1 and S2 Present, no Murmur, Peripheral Pulses Present Respiratory: Bilateral Air entry equal and Decreased, bilateral basal Crackles, no wheezes Abdomen: Bowel Sound present, Soft and non tender Skin: no Rash Extremities: Bilateral Pedal edema, no calf tenderness Neurologic: Grossly no focal  neuro deficit.  Labs on Admission:  CBC:  Recent Labs Lab 07/28/14 2220 07/29/14 0257  WBC 8.4 8.5  NEUTROABS 6.7  --   HGB 11.0* 9.9*  HCT 34.8* 31.4*  MCV 90.9 89.7  PLT 310 297    CMP     Component Value Date/Time   NA 142 07/29/2014 0257   K 3.6 07/29/2014 0257   CL 106 07/29/2014 0257   CO2 32 07/29/2014 0257   GLUCOSE 187* 07/29/2014 0257   BUN 15 07/29/2014 0257   CREATININE 1.01 07/29/2014 0257   CALCIUM 9.4 07/29/2014 0257   PROT 5.6* 07/29/2014 0257   ALBUMIN 3.0* 07/29/2014 0257   AST 48* 07/29/2014 0257   ALT 78* 07/29/2014 0257   ALKPHOS 127* 07/29/2014 0257   BILITOT 0.6 07/29/2014 0257   GFRNONAA 83* 07/29/2014 0257   GFRAA >90 07/29/2014 0257    No results for input(s): LIPASE, AMYLASE in the last 168 hours.   Recent Labs Lab 07/28/14 2329 07/29/14 0257  TROPONINI 0.03 0.04*   BNP (last 3 results)  Recent Labs  04/01/14 2214  PROBNP 585.7*    Radiological Exams on Admission: Dg Chest 2 View  07/28/2014   CLINICAL DATA:  Chest pain abdomen pain dyspnea  EXAM: CHEST  2 VIEW  COMPARISON:  07/15/2014  FINDINGS: There is generalized interstitial coarsening which is worsened from 07/15/2014. This could represent interstitial infiltrates or interstitial fluid. No airspace consolidation is evident. There are no pleural effusions.  IMPRESSION: Interstitial fluid versus interstitial infiltrates, new/worsened from 07/15/2014.   Electronically Signed   By: Ellery Plunk M.D.   On: 07/28/2014 23:05   Dg Abd 2 Views  07/28/2014   CLINICAL DATA:  Abdominal pain, chest pain, shortness of breath, cough.  EXAM: ABDOMEN - 2 VIEW  COMPARISON:  04/01/2014  FINDINGS: Upright view is limited by motion artifact. Diffusely stool-filled colon. No small or large bowel distention. No free intra-abdominal air. No abnormal air-fluid levels. No radiopaque stones. Degenerative changes in the spine.  IMPRESSION: Nonobstructive bowel gas pattern with stool-filled colon.    Electronically Signed   By: Burman Nieves M.D.   On: 07/28/2014 23:06    EKG: Independently reviewed. normal sinus rhythm, nonspecific ST and T waves changes.  Assessment/Plan Principal Problem:   Acute CHF (congestive heart failure) Active Problems:   Diabetes mellitus   Essential hypertension, benign   Paranoid schizophrenia, chronic condition   Cocaine abuse   1. Acute CHF (congestive heart failure) The patient is presenting with complaints of cough shortness of breath. His found to have elevated proBNP, with chest x-ray showing interstitial congestion and EKG showing nonspecific ST-T wave changes. Initial troponin is negative. Patient has been using cocaine on a daily basis. Current presentation can be acute on chronic CHF most likely diastolic. We will get an echocardiogram in the morning. Serial troponin. Patient will be given IV Lasix. Monitor ins and outs and daily weight.  2. Paranoid schizophrenia. Patient mentions he has been  taking his psych medication. May need consultation with psychiatry in the morning about medication adjustment.  3. Diabetes mellitus. Patient has a documented diagnosis of diabetes. Patient is not on any medication. Will check hemoglobin A1c.  4. Cocaine abuse. High blood pressure. Continue clonidine  Advance goals of care discussion: Full code    DVT Prophylaxis: subcutaneous Heparin Nutrition: Cardiac diet  Disposition: Admitted to inpatient in telemetry unit.  Author: Lynden OxfordPranav Trell Secrist, MD [facility] Pager: [phone] 07/29/2014, 5:45 AM    If 7PM-7AM, please contact night-coverage [email or web address] Password [email or web address]
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
## 2014-07-29 NOTE — Care Management Note (Unsigned)
    Page 1 of 1   07/29/2014     4:10:50 PM CARE MANAGEMENT NOTE 07/29/2014  Patient:  [name],[name]   Account Number:  [id number]  Date Initiated:  07/29/2014  Documentation initiated by:  Karanvir Balderston  Subjective/Objective Assessment:   Pt adm on 07/28/14 with CHF, SOB.  PTA, pt homeless and schizophrenic; current cocaine use.     Action/Plan:   CSW consulted for homelessness and PSA.  Will follow for dc needs as pt progresses.  Pt has no PCP; [facility] up.   Anticipated DC Date:  07/31/2014   Anticipated DC Plan:  HOME/SELF CARE      DC Planning Services  CM consult      Choice offered to / List presented to:             Status of service:  In process, will continue to follow Medicare Important Message given?   (If response is "NO", the following Medicare IM given date fields will be blank) Date Medicare IM given:   Medicare IM given by:   Date Additional Medicare IM given:   Additional Medicare IM given by:    Discharge Disposition:    Per UR Regulation:  Reviewed for med. necessity/level of care/duration of stay  If discussed at Long Length of Stay Meetings, dates discussed:    Comments:

## 2014-07-29 NOTE — Progress Notes (Signed)
Pt visibly agitated, anxious, pacing room, talking to self, raising voice to staff. Nurse admin ativan 1mg  po per order. Will continue to monitor pt closely.   Andrew AuVafiadis, Jalene Lacko I 07/29/2014 11:45 AM

## 2014-07-29 NOTE — Progress Notes (Signed)
Pt very agitated, wanting food all the time gets upset if staff will not give him something to eat, pt given graham crackers and apple juice. Sitter at bedside.

## 2014-07-29 NOTE — ED Notes (Signed)
All pt belongings went with pt.

## 2014-07-29 NOTE — Plan of Care (Signed)
Problem: Phase I Progression Outcomes Goal: Voiding-avoid urinary catheter unless indicated Outcome: Completed/Met Date Met:  07/29/14 Pt voided in urinal, output is documented

## 2014-07-29 NOTE — ED Notes (Signed)
Pt refusing foley catheter
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
## 2014-07-29 NOTE — Consult Note (Signed)
CONSULT NOTE New  Date: 07/29/2014               Patient Name:  [name] MRN: [id number]  DOB: [birth year] Age / Sex: 53 y.o., male        PCP: No PCP Per Patient Primary Cardiologist: New             Referring Physician: Rai              Reason for Consult: Troponin of 0.04           History of Present Illness: Patient is a 54 y.o. male with a PMHx of schizophrenia, hypertension, chronic cocaine abuse, who was admitted to [facility] on 07/28/2014 for evaluation of urinary incontinence.  He denies any cardiac complaints. He clearly has psychiatric issues. It was very hard to keep him on topic. He apparently was diagnosed with congestive heart failure last year but I cannot find any evidence of echocardiograms and I cannot find any cardiology notes for the past several years.  He has a history of hypertension. He does not take his medications. He appears to be homeless. In review of the record, there are multiple visits to the emergency room and behavior health..   He admits to smoking crack cocaine on a frequent basis.  Medications: Outpatient medications: Prescriptions prior to admission  Medication Sig Dispense Refill Last Dose  . ARIPiprazole (ABILIFY) 5 MG tablet Take 1 tablet (5 mg total) by mouth daily. 30 tablet 0 07/28/2014 at Unknown time  . cloNIDine (CATAPRES) 0.2 MG tablet Take 0.2 mg by mouth 2 (two) times daily.   07/28/2014 at Unknown time  . benztropine (COGENTIN) 2 MG tablet Take 1 tablet (2 mg total) by mouth 2 (two) times daily. (Patient not taking: Reported on 07/29/2014) 30 tablet 0 Not Taking at Unknown time  . cephALEXin (KEFLEX) 500 MG capsule Take 1 capsule (500 mg total) by mouth 4 (four) times daily. (Patient not taking: Reported on 07/29/2014) 40 capsule 0 Completed Course at Unknown time  . fluPHENAZine decanoate (PROLIXIN) 25 MG/ML injection Inject 1 mL (25 mg total) into the muscle every 14 (fourteen) days. (Patient not taking: Reported on  06/24/2014) 5 mL  Not Taking at Unknown time  . hydrochlorothiazide (HYDRODIURIL) 25 MG tablet Take 1 tablet (25 mg total) by mouth daily. (Patient not taking: Reported on 07/29/2014) 30 tablet 0 Not Taking at Unknown time    Current medications: Current Facility-Administered Medications  Medication Dose Route Frequency Provider Last Rate Last Dose  . acetaminophen (TYLENOL) tablet 650 mg  650 mg Oral Q6H PRN Lynden Oxford, MD   650 mg at 07/29/14 0345   Or  . acetaminophen (TYLENOL) suppository 650 mg  650 mg Rectal Q6H PRN Lynden Oxford, MD      . ARIPiprazole (ABILIFY) tablet 5 mg  5 mg Oral BID Nehemiah Settle, MD   5 mg at 07/29/14 1113  . benztropine (COGENTIN) tablet 1 mg  1 mg Oral BID Nehemiah Settle, MD   1 mg at 07/29/14 1112  . cloNIDine (CATAPRES) tablet 0.2 mg  0.2 mg Oral BID Lynden Oxford, MD   0.2 mg at 07/29/14 1015  . enoxaparin (LOVENOX) injection 40 mg  40 mg Subcutaneous Q24H Lynden Oxford, MD      . furosemide (LASIX) injection 40 mg  40 mg Intravenous Daily Ripudeep K Rai, MD   40 mg at 07/29/14 1015  . hydrALAZINE (APRESOLINE) injection 10 mg  10 mg Intravenous Q4H PRN Lynden Oxford, MD      . LORazepam (ATIVAN) injection 1 mg  1 mg Intravenous Q6H PRN Ripudeep Jenna Luo, MD       Or  . LORazepam (ATIVAN) tablet 1 mg  1 mg Oral Q6H PRN Ripudeep Jenna Luo, MD   1 mg at 07/29/14 1144  . ondansetron (ZOFRAN) tablet 4 mg  4 mg Oral Q6H PRN Lynden Oxford, MD       Or  . ondansetron (ZOFRAN) injection 4 mg  4 mg Intravenous Q6H PRN Lynden Oxford, MD      . tamsulosin (FLOMAX) capsule 0.4 mg  0.4 mg Oral QPC supper Lynden Oxford, MD   0.4 mg at 07/29/14 0344     Allergies  Allergen Reactions  . Haldol [Haloperidol] Other (See Comments)    Muscle spasms, loss of voluntary movement. However, pt has taken Thorazine on multiple occasions with no adverse effects.      Past Medical History  Diagnosis Date  . Diabetes mellitus without complication   . Hypertension   .  Schizophrenia   . CHF (congestive heart failure)   . Neuropathy   . Polysubstance abuse   . Cocaine abuse   . Homelessness     History reviewed. No pertinent past surgical history.  Family History  Problem Relation Age of Onset  . Hypertension Other   . Diabetes Other     Social History:  reports that he has been smoking Cigarettes.  He has a 20 pack-year smoking history. He uses smokeless tobacco. He reports that he drinks alcohol. He reports that he uses illicit drugs ("Crack" cocaine, Cocaine, and Marijuana) about 7 times per week.   Review of Systems: Constitutional:  denies fever, chills, diaphoresis, appetite change and fatigue.  HEENT: denies photophobia, eye pain, redness, hearing loss, ear pain, congestion, sore throat, rhinorrhea, sneezing, neck pain, neck stiffness and tinnitus.  Respiratory: denies SOB, DOE, cough, chest tightness, and wheezing.  Cardiovascular: denies chest pain, palpitations and leg swelling.  Gastrointestinal: denies nausea, vomiting, abdominal pain, diarrhea, constipation, blood in stool.  Genitourinary: admits to  Urinary incontenence   Musculoskeletal: denies  myalgias, back pain, joint swelling, arthralgias and gait problem.   Skin: denies pallor, rash and wound.  Neurological: denies dizziness, seizures, syncope, weakness, light-headedness, numbness and headaches.   Hematological: denies adenopathy, easy bruising, personal or family bleeding history.  Psychiatric/ Behavioral: denies suicidal ideation, mood changes, confusion, nervousness, sleep disturbance and agitation.    Physical Exam: BP 137/67 mmHg  Pulse 75  Temp(Src) 97.2 F (36.2 C) (Oral)  Resp 20  Ht 5\' 8"  (1.727 m)  Wt 173 lb 15.1 oz (78.9 kg)  BMI 26.45 kg/m2  SpO2 97%  Wt Readings from Last 3 Encounters:  07/29/14 173 lb 15.1 oz (78.9 kg)  07/15/14 170 lb (77.111 kg)  07/07/14 170 lb (77.111 kg)    General: Vital signs reviewed and noted. Well-developed,  well-nourished, in no acute distress; alert,   Head: Normocephalic, atraumatic, sclera anicteric,   Neck: Supple. Negative for carotid bruits. No JVD   Lungs:  Clear bilaterally, no  wheezes, rales, or rhonchi. Breathing is normal   Heart: RRR with S1 S2. No murmurs, rubs, or gallops   Abdomen/ GI :  Soft, non-tender, non-distended with normoactive bowel sounds. No hepatomegaly. No rebound/guarding. No obvious abdominal masses   MSK: Strength and the appear normal for age.   Extremities: No clubbing or cyanosis. No edema.  Distal pedal pulses are  2+ and equal   Neurologic:  CN are grossly intact,  No obvious motor or sensory defect.  Alert and oriented X 3. Moves all extremities spontaneously.  Psych: Responds to questions appropriately with a normal affect.     Lab results: Basic Metabolic Panel:  Recent Labs Lab 07/28/14 2220 07/29/14 0257  NA 142 142  K 3.5 3.6  CL 105 106  CO2 30 32  GLUCOSE 180* 187*  BUN 14 15  CREATININE 0.97 1.01  CALCIUM 9.3 9.4    Liver Function Tests:  Recent Labs Lab 07/28/14 2220 07/29/14 0257  AST 62* 48*  ALT 91* 78*  ALKPHOS 152* 127*  BILITOT 0.5 0.6  PROT 6.2 5.6*  ALBUMIN 3.1* 3.0*   No results for input(s): LIPASE, AMYLASE in the last 168 hours. No results for input(s): AMMONIA in the last 168 hours.  CBC:  Recent Labs Lab 07/28/14 2220 07/29/14 0257  WBC 8.4 8.5  NEUTROABS 6.7  --   HGB 11.0* 9.9*  HCT 34.8* 31.4*  MCV 90.9 89.7  PLT 310 297    Cardiac Enzymes:  Recent Labs Lab 07/28/14 2329 07/29/14 0257 07/29/14 0745  TROPONINI 0.03 0.04* 0.04*    BNP: Invalid input(s): POCBNP  CBG:  Recent Labs Lab 07/28/14 2221  GLUCAP 170*    Coagulation Studies:  Recent Labs  07/29/14 0257  LABPROT 13.6  INR 1.02     Other results:  Personal review of EKG shows :  -  NSR , TWI,    Imaging: Dg Chest 2 View  07/28/2014   CLINICAL DATA:  Chest pain abdomen pain dyspnea  EXAM: CHEST  2 VIEW   COMPARISON:  07/15/2014  FINDINGS: There is generalized interstitial coarsening which is worsened from 07/15/2014. This could represent interstitial infiltrates or interstitial fluid. No airspace consolidation is evident. There are no pleural effusions.  IMPRESSION: Interstitial fluid versus interstitial infiltrates, new/worsened from 07/15/2014.   Electronically Signed   By: Ellery Plunk M.D.   On: 07/28/2014 23:05   Dg Abd 2 Views  07/28/2014   CLINICAL DATA:  Abdominal pain, chest pain, shortness of breath, cough.  EXAM: ABDOMEN - 2 VIEW  COMPARISON:  04/01/2014  FINDINGS: Upright view is limited by motion artifact. Diffusely stool-filled colon. No small or large bowel distention. No free intra-abdominal air. No abnormal air-fluid levels. No radiopaque stones. Degenerative changes in the spine.  IMPRESSION: Nonobstructive bowel gas pattern with stool-filled colon.   Electronically Signed   By: Burman Nieves M.D.   On: 07/28/2014 23:06    Echo is pending.   Assessment & Plan:  1. Elevated troponin level: The patient resented with urinary incontinence. I'm not entirely sure why troponin level was ordered but was found to be minimally elevated at 0.04.  His BNP level was noted be very minimally elevated at 225.7.  He has a history of chronic cocaine abuse and in fact is positive for for cocaine this admission.  His blood pressure seems to be fairly well-controlled.  An echo cardiaciogram has been ordered and has not been read yet. At this pont, I would not work up this trivial troponin level.  He has no cardiac symptoms.  He smokes crack cocaine reguarly - was + for cocaine this admission.    2. HTN :  He has hx of HTN and is on clonidine.  I think that we should try to use a different medication than clonidine due to the possiblity of rebund HTN. I would try him  on either ARB or Amlodipine (depending on results of echo)   3. Reported hx of CHF :  Echo results are pending .  4.  Chronic cocaine abuse:  I've advised him to stop using cocaine.  He will likely continue to have various issues as long as he is using cocaine.   It will be impossible to adequately treat his HTN and other issues as long as he is using cocaine.   Will sign off.  Call for questions.   Vesta MixerPhilip J. Reedy Biernat, Montez HagemanJr., MD, [facility] 07/29/2014, 11:48 AM Office - [phone] Pager [phone]

## 2014-07-29 NOTE — Progress Notes (Addendum)
°   07/29/14 1700  Clinical Encounter Type  Visited With Patient  Visit Type Initial  Referral From Nurse  Consult/Referral To Chaplain  Spiritual Encounters  Spiritual Needs Sacred text;Prayer  Stress Factors  Patient Stress Factors Family relationships;Financial concerns;Other (Comment)  Advance Directives (For Healthcare)  Does patient have an advance directive? No   Chaplain made initial visit per consult request.  Patient reports stress of being homeless, and requested prayer that he find a place to stay and that his family would "be strong" and supportive.  Per his report, a family member has been contacted and will come later.  Patient requested a copy of Bible which Chaplain provided.  Chaplain prayed with patient.  Willing to follow up if requested.  [name]  5:29 PM

## 2014-07-30 DIAGNOSIS — I509 Heart failure, unspecified: Secondary | ICD-10-CM

## 2014-07-30 LAB — URINE CULTURE
COLONY COUNT: NO GROWTH
Culture: NO GROWTH

## 2014-07-30 LAB — BASIC METABOLIC PANEL
ANION GAP: 5 (ref 5–15)
BUN: 20 mg/dL (ref 6–23)
CO2: 29 mmol/L (ref 19–32)
Calcium: 9 mg/dL (ref 8.4–10.5)
Chloride: 102 mmol/L (ref 96–112)
Creatinine, Ser: 0.98 mg/dL (ref 0.50–1.35)
GFR calc Af Amer: >90 mL/min (ref >90)
GFR calc non Af Amer: >90 mL/min (ref >90)
Glucose, Bld: 146 mg/dL — ABNORMAL HIGH (ref 70–99)
POTASSIUM: 3.8 mmol/L (ref 3.5–5.1)
SODIUM: 136 mmol/L (ref 135–145)

## 2014-07-30 LAB — HEMOGLOBIN A1C
Hgb A1c MFr Bld: 6.1 % — ABNORMAL HIGH (ref 4.8–5.6)
Mean Plasma Glucose: 128 mg/dL

## 2014-07-30 MED ORDER — METFORMIN HCL 500 MG PO TABS: 500.0000 mg | ORAL_TABLET | Freq: Every day | ORAL | Status: DC

## 2014-07-30 MED ORDER — ASPIRIN 81 MG PO TBEC: 81.0000 mg | DELAYED_RELEASE_TABLET | Freq: Every day | ORAL | Status: DC

## 2014-07-30 MED ORDER — FUROSEMIDE 20 MG PO TABS: 20.0000 mg | ORAL_TABLET | Freq: Every day | ORAL | Status: DC

## 2014-07-30 MED ORDER — METFORMIN HCL 500 MG PO TABS
500.0000 mg | ORAL_TABLET | Freq: Every day | ORAL | Status: DC
  Filled 2014-07-30: qty 1

## 2014-07-30 MED ORDER — TAMSULOSIN HCL 0.4 MG PO CAPS: 0.4000 mg | ORAL_CAPSULE | Freq: Every day | ORAL | Status: DC

## 2014-07-30 MED ORDER — ARIPIPRAZOLE 5 MG PO TABS: 5.0000 mg | ORAL_TABLET | Freq: Every day | ORAL | Status: DC

## 2014-07-30 MED ORDER — ASPIRIN EC 81 MG PO TBEC
81.0000 mg | DELAYED_RELEASE_TABLET | Freq: Every day | ORAL | Status: DC
  Filled 2014-07-30: qty 1

## 2014-07-30 MED ORDER — BENZTROPINE MESYLATE 2 MG PO TABS: 2.0000 mg | ORAL_TABLET | Freq: Two times a day (BID) | ORAL | Status: DC

## 2014-07-30 MED ORDER — CLONIDINE HCL 0.1 MG PO TABS: 0.1000 mg | ORAL_TABLET | Freq: Two times a day (BID) | ORAL | Status: DC

## 2014-07-30 MED ORDER — BENAZEPRIL HCL 5 MG PO TABS: 5.0000 mg | ORAL_TABLET | Freq: Every day | ORAL | Status: DC

## 2014-07-30 NOTE — Discharge Summary (Signed)
Physician Discharge Summary  Patient ID: [name] MRN: [id number] DOB/AGE: July 04, 1960 54 y.o.  Admit date: 07/28/2014 Discharge date: 07/30/2014  Primary Care Physician:  No PCP Per Patient  Discharge Diagnoses:      Acute CHF likely from cocaine binging and hypertensive cardiomyopathy    diabetes mellitus  . Cocaine abuse . Essential hypertension, benign . Paranoid schizophrenia, chronic conditionWith agitation   Medical noncompliance  Consults:  Cardiology, Dr Elease Hashimoto  Recommendations for Outpatient Follow-up:   Patient was commended to follow up with psychiatry outpatient and [facility]  He was strongly advised to stop cocaine, patient was not however interested in stopping  Patient was started on several new medications: Lasix, benazepril, metformin, Flomax. Clonidine was tapered down to 0.1 mg BID   TESTS THAT NEED FOLLOW-UP BMET at the follow-up appointment at [facility] if he follows up  DIET: Cart modified diet    Allergies:   Allergies  Allergen Reactions  . Haldol [Haloperidol] Other (See Comments)    Muscle spasms, loss of voluntary movement. However, pt has taken Thorazine on multiple occasions with no adverse effects.      Discharge Medications:   Medication List    STOP taking these medications        cephALEXin 500 MG capsule  Commonly known as:  KEFLEX     hydrochlorothiazide 25 MG tablet  Commonly known as:  HYDRODIURIL      TAKE these medications        ARIPiprazole 5 MG tablet  Commonly known as:  ABILIFY  Take 1 tablet (5 mg total) by mouth daily.     aspirin 81 MG EC tablet  Take 1 tablet (81 mg total) by mouth daily.     benazepril 5 MG tablet  Commonly known as:  LOTENSIN  Take 1 tablet (5 mg total) by mouth daily.     benztropine 2 MG tablet  Commonly known as:  COGENTIN  Take 1 tablet (2 mg total) by mouth 2 (two) times daily.     cloNIDine 0.1 MG tablet  Commonly known as:  CATAPRES   Take 1 tablet (0.1 mg total) by mouth 2 (two) times daily.     fluPHENAZine decanoate 25 MG/ML injection  Commonly known as:  PROLIXIN  Inject 1 mL (25 mg total) into the muscle every 14 (fourteen) days.     furosemide 20 MG tablet  Commonly known as:  LASIX  Take 1 tablet (20 mg total) by mouth daily.     metFORMIN 500 MG tablet  Commonly known as:  GLUCOPHAGE  Take 1 tablet (500 mg total) by mouth daily with breakfast.     tamsulosin 0.4 MG Caps capsule  Commonly known as:  FLOMAX  Take 1 capsule (0.4 mg total) by mouth daily after supper.         Brief H and P: For complete details please refer to admission H and P, but in brief Patient is a 54 year old male with diabetes, hypertension, paranoid schizophrenia, CHF, substance abuse, cocaine, homeless with recurrent ER visits. Patient presented with multitude of complaints with shortness of breath, abdominal pain, urinary incontinence, cold and congestion. Patient also reported that he had been using cocaine for the whole day and uses it daily and does not have any plans to quit. He also smokes cigarettes on a daily basis. Patient is also paranoid and reported that somebody was trying to make him sleep by witchcraft. Patient reported that he was standing on the street  with a placard asking for money in cold and hence he was congested.   Hospital Course:  Acute CHF (congestive heart failure) with elevated troponins, elevated proBNP chest x-ray showing interstitial congestion. No fevers or leukocytosis or any productive cough likely volume overload was precipitated due to hypertensive cardiomyopathy and cocaine binging Patient was placed on Lasix for diuresis, improved his symptoms. Cardiology was consulted, patient was seen by Dr. Elease HashimotoNahser, recommended no further plans for further cardiac workup as likely patient's symptoms are precipitated due to his daily cocaine use. Patient was placed on aspirin, benazepril, low-dose Lasix. Clonidine  was tapered down which is not a good drug in his situation as it can cause rebound hypertension. HCTZ was discontinued. No beta blockers due to cocaine use. 2-D echo showed EF of 55-60%, no regional wall motion abnormalities, mild aortic regurgitation, LVH and severely dilated left atrium and moderately dilated right atrium.  Paranoid schizophrenia, agitation - Patient had intermittent agitation during hospitalization psychiatry was consulted. Patient was seen by Dr.Jonalaggada who recommended to restart Abilify and Cogentin.    Diabetes mellitus - Hemoglobin A1c 6.1, patient is noncompliant with the diet and I doubt he will be compliant with the cart modified diet. His blood sugars were intermittently elevated in 180's. Hence patient was started on metformin 500 mg daily.    Essential hypertension, benign - Continue Lasix, clonidine taper, started on benazpril   Cocaine abuse - Discussed in detail with the patient, he has no interest in quitting cocaine  Urinary incontinence  - does not have UTI per UA, continue Flomax   Day of Discharge BP 124/92 mmHg  Pulse 84  Temp(Src) 98.6 F (37 C) (Oral)  Resp 21  Ht 5\' 8"  (1.727 m)  Wt 83.7 kg (184 lb 8.4 oz)  BMI 28.06 kg/m2  SpO2 99%  Physical Exam: General: Alert and awake oriented x3 not in any acute distress. HEENT: anicteric sclera, pupils reactive to light and accommodation CVS: S1-S2 clear no murmur rubs or gallops Chest: clear to auscultation bilaterally, no wheezing rales or rhonchi Abdomen: soft nontender, nondistended, normal bowel sounds Extremities: no cyanosis, clubbing or edema noted bilaterally Neuro: Cranial nerves II-XII intact, no focal neurological deficits   The results of significant diagnostics from this hospitalization (including imaging, microbiology, ancillary and laboratory) are listed below for reference.    LAB RESULTS: Basic Metabolic Panel:  Recent Labs Lab 07/29/14 0257 07/30/14 0616  NA  142 136  K 3.6 3.8  CL 106 102  CO2 32 29  GLUCOSE 187* 146*  BUN 15 20  CREATININE 1.01 0.98  CALCIUM 9.4 9.0   Liver Function Tests:  Recent Labs Lab 07/28/14 2220 07/29/14 0257  AST 62* 48*  ALT 91* 78*  ALKPHOS 152* 127*  BILITOT 0.5 0.6  PROT 6.2 5.6*  ALBUMIN 3.1* 3.0*   No results for input(s): LIPASE, AMYLASE in the last 168 hours. No results for input(s): AMMONIA in the last 168 hours. CBC:  Recent Labs Lab 07/28/14 2220 07/29/14 0257  WBC 8.4 8.5  NEUTROABS 6.7  --   HGB 11.0* 9.9*  HCT 34.8* 31.4*  MCV 90.9 89.7  PLT 310 297   Cardiac Enzymes:  Recent Labs Lab 07/29/14 0745 07/29/14 1300  TROPONINI 0.04* 0.03   BNP: Invalid input(s): POCBNP CBG:  Recent Labs Lab 07/28/14 2221  GLUCAP 170*    Significant Diagnostic Studies:  Dg Chest 2 View  07/28/2014   CLINICAL DATA:  Chest pain abdomen pain dyspnea  EXAM: CHEST  2 VIEW  COMPARISON:  07/15/2014  FINDINGS: There is generalized interstitial coarsening which is worsened from 07/15/2014. This could represent interstitial infiltrates or interstitial fluid. No airspace consolidation is evident. There are no pleural effusions.  IMPRESSION: Interstitial fluid versus interstitial infiltrates, new/worsened from 07/15/2014.   Electronically Signed   By: Ellery Plunk M.D.   On: 07/28/2014 23:05   Dg Abd 2 Views  07/28/2014   CLINICAL DATA:  Abdominal pain, chest pain, shortness of breath, cough.  EXAM: ABDOMEN - 2 VIEW  COMPARISON:  04/01/2014  FINDINGS: Upright view is limited by motion artifact. Diffusely stool-filled colon. No small or large bowel distention. No free intra-abdominal air. No abnormal air-fluid levels. No radiopaque stones. Degenerative changes in the spine.  IMPRESSION: Nonobstructive bowel gas pattern with stool-filled colon.   Electronically Signed   By: Burman Nieves M.D.   On: 07/28/2014 23:06    2D ECHO: Study Conclusions  - Left ventricle: The cavity size was normal.  Wall thickness was increased in a pattern of moderate LVH. Systolic function was normal. The estimated ejection fraction was in the range of 55% to 60%. Wall motion was normal; there were no regional wall motion abnormalities. - Aortic valve: There was mild regurgitation. Valve area (VTI): 2.28 cm^2. Valve area (Vmax): 2.43 cm^2. Valve area (Vmean): 2.67 cm^2. - Left atrium: The atrium was severely dilated. - Right atrium: The atrium was moderately dilated  Disposition and Follow-up:    DISPOSITION: home   DISCHARGE FOLLOW-UP Follow-up Information    Follow up with [facility]    . Schedule an appointment as soon as possible for a visit in 10 days.   Why:  for hospital follow-up   Contact information:   [address] [place] [address] [phone]       Time spent on Discharge: 35 mins  Signed:   RAI,RIPUDEEP M.D. [facility] 07/30/2014, 10:39 AM Pager: [phone]

## 2014-07-30 NOTE — Progress Notes (Signed)
  Echocardiogram 2D Echocardiogram has been performed.  Janalyn HarderWest, Octavie Westerhold R 07/30/2014, 9:36 AM

## 2014-07-30 NOTE — Progress Notes (Signed)
Pt d/c'd to self care. D/c rx and instructions given to and d/w pt

## 2014-07-30 NOTE — Progress Notes (Signed)
No evidence of LV dysfunction or regional wall motion abnormalities on echo. Please refer to Dr. Harvie BridgeNahser's recommendations in yesterday's note

## 2014-07-31 ENCOUNTER — Encounter (HOSPITAL_COMMUNITY): Payer: Self-pay | Admitting: *Deleted

## 2014-07-31 ENCOUNTER — Emergency Department (HOSPITAL_COMMUNITY)
Admission: EM | Admit: 2014-07-31 | Discharge: 2014-07-31 | Disposition: A | Discharge status: Home / Self Care | Payer: MEDICAID | Attending: Emergency Medicine | Admitting: Emergency Medicine

## 2014-07-31 ENCOUNTER — Emergency Department (HOSPITAL_COMMUNITY)
Admission: EM | Admit: 2014-07-31 | Discharge: 2014-07-31 | Disposition: A | Discharge status: Home / Self Care | Payer: Medicare Other | Attending: Emergency Medicine | Admitting: Emergency Medicine

## 2014-07-31 DIAGNOSIS — I509 Heart failure, unspecified: Secondary | ICD-10-CM | POA: Insufficient documentation

## 2014-07-31 DIAGNOSIS — I10 Essential (primary) hypertension: Secondary | ICD-10-CM | POA: Insufficient documentation

## 2014-07-31 DIAGNOSIS — Z590 Homelessness: Secondary | ICD-10-CM | POA: Diagnosis not present

## 2014-07-31 DIAGNOSIS — Z8669 Personal history of other diseases of the nervous system and sense organs: Secondary | ICD-10-CM | POA: Insufficient documentation

## 2014-07-31 DIAGNOSIS — Z8659 Personal history of other mental and behavioral disorders: Secondary | ICD-10-CM | POA: Insufficient documentation

## 2014-07-31 DIAGNOSIS — E119 Type 2 diabetes mellitus without complications: Secondary | ICD-10-CM | POA: Insufficient documentation

## 2014-07-31 DIAGNOSIS — R05 Cough: Secondary | ICD-10-CM | POA: Insufficient documentation

## 2014-07-31 DIAGNOSIS — R059 Cough, unspecified: Secondary | ICD-10-CM

## 2014-07-31 DIAGNOSIS — F209 Schizophrenia, unspecified: Secondary | ICD-10-CM | POA: Insufficient documentation

## 2014-07-31 DIAGNOSIS — Z720 Tobacco use: Secondary | ICD-10-CM | POA: Insufficient documentation

## 2014-07-31 DIAGNOSIS — K6289 Other specified diseases of anus and rectum: Secondary | ICD-10-CM | POA: Insufficient documentation

## 2014-07-31 DIAGNOSIS — R079 Chest pain, unspecified: Secondary | ICD-10-CM | POA: Insufficient documentation

## 2014-07-31 DIAGNOSIS — Z79899 Other long term (current) drug therapy: Secondary | ICD-10-CM | POA: Insufficient documentation

## 2014-07-31 DIAGNOSIS — Z7982 Long term (current) use of aspirin: Secondary | ICD-10-CM | POA: Insufficient documentation

## 2014-07-31 MED ORDER — GUAIFENESIN 100 MG/5ML PO SOLN
5.0000 mL | Freq: Once | ORAL | Status: AC
  Administered 2014-07-31: 100 mg via ORAL
  Filled 2014-07-31: qty 5

## 2014-07-31 MED ORDER — GUAIFENESIN 100 MG/5ML PO LIQD: 100.0000 mg | ORAL | Status: DC | PRN

## 2014-07-31 NOTE — ED Notes (Signed)
Pt c/o rectal pain starting today. Pt states he thinks something crawled up inside his rectum.

## 2014-07-31 NOTE — ED Provider Notes (Signed)
CSN: [id number]     Arrival date & time 07/31/14  2006 History   First MD Initiated Contact with Patient 07/31/14 2117     Chief Complaint  Patient presents with  . Rectal Pain     The history is provided by the patient.  Patient presents with rectal pain.  Pt reports he feels "Something crawled up" into his rectum.  He reports mild pain.  He does not report fever. Nothing improves his symptoms.  Nothing worsens his symptoms  He also mentions cough/congestion.  He reports he just got out of the hospital  Soon after my interview, pt begins to ask for bus pass and if I can refill his medications.  Past Medical History  Diagnosis Date  . Diabetes mellitus without complication   . Hypertension   . Schizophrenia   . CHF (congestive heart failure)   . Neuropathy   . Polysubstance abuse   . Cocaine abuse   . Homelessness    History reviewed. No pertinent past surgical history. Family History  Problem Relation Age of Onset  . Hypertension Other   . Diabetes Other    History  Substance Use Topics  . Smoking status: Current Every Day Smoker -- 1.00 packs/day for 20 years    Types: Cigarettes  . Smokeless tobacco: Current User  . Alcohol Use: Yes     Comment: Pt reports "on special occasions"    Review of Systems  Constitutional: Negative for fever.  Genitourinary:       Rectal pain       Allergies  Haldol  Home Medications   Prior to Admission medications   Medication Sig Start Date End Date Taking? Authorizing Provider  ARIPiprazole (ABILIFY) 5 MG tablet Take 1 tablet (5 mg total) by mouth daily. 07/30/14   Ripudeep Jenna Luo, MD  aspirin EC 81 MG EC tablet Take 1 tablet (81 mg total) by mouth daily. 07/30/14   Ripudeep Jenna Luo, MD  benazepril (LOTENSIN) 5 MG tablet Take 1 tablet (5 mg total) by mouth daily. 07/30/14   Ripudeep Jenna Luo, MD  benztropine (COGENTIN) 2 MG tablet Take 1 tablet (2 mg total) by mouth 2 (two) times daily. 07/30/14   Ripudeep Jenna Luo, MD  cloNIDine  (CATAPRES) 0.1 MG tablet Take 1 tablet (0.1 mg total) by mouth 2 (two) times daily. 07/30/14   Ripudeep Jenna Luo, MD  fluPHENAZine decanoate (PROLIXIN) 25 MG/ML injection Inject 1 mL (25 mg total) into the muscle every 14 (fourteen) days. Patient not taking: Reported on 06/24/2014 06/04/14   Beau Fanny, FNP  furosemide (LASIX) 20 MG tablet Take 1 tablet (20 mg total) by mouth daily. 07/30/14   Ripudeep Jenna Luo, MD  metFORMIN (GLUCOPHAGE) 500 MG tablet Take 1 tablet (500 mg total) by mouth daily with breakfast. 07/30/14   Ripudeep Jenna Luo, MD  tamsulosin (FLOMAX) 0.4 MG CAPS capsule Take 1 capsule (0.4 mg total) by mouth daily after supper. 07/30/14   Ripudeep K Rai, MD   BP 167/95 mmHg  Pulse 105  Temp(Src) 98.4 F (36.9 C) (Oral)  Resp 18  SpO2 93% Physical Exam CONSTITUTIONAL:  anxious, disheveled HEAD: Normocephalic/atraumatic EYES: EOMI/PERRL ENMT: Mucous membranes moist NECK: supple no meningeal signs SPINE/BACK:entire spine nontender CV: S1/S2 noted, no murmurs/rubs/gallops noted LUNGS: scattered coarse BS noted bilaterally.  No distress and no tachypnea noted ABDOMEN: soft Rectal - rectal tone intact.  Brown stool noted.  No blood or melena noted.  No rectal mass/abscess noted  No foreign  body noted.  No evidence of trauma.  Nurse chaperone present GU:no cva tenderness NEURO: Pt is awake/alert/appropriate, moves all extremitiesx4.  No facial droop.   EXTREMITIES: pulses normal/equal, full ROM SKIN: warm, color normal PSYCH: anxious  ED Course  Procedures   Pt presents with rectal pain , but during interview begins to discuss he was just in hospital and was discharged this morning but would like to be re-admitted.  It appears he was admitted for CHF - there is no signs of acute CHF - he is in no distress, no crackles noted on exam.   Also for his rectal pain - no signs of abscess/trauma or foreign body At this point I feel patient is appropriate for d/c home   MDM   Final  diagnoses:  Rectal pain    Nursing notes including past medical history and social history reviewed and considered in documentation     Joya Gaskinsonald W Shayona Hibbitts, MD 07/31/14 2206

## 2014-07-31 NOTE — Discharge Instructions (Signed)
Cough, Adult  A cough is a reflex that helps clear your throat and airways. It can help heal the body or may be a reaction to an irritated airway. A cough may only last 2 or 3 weeks (acute) or may last more than 8 weeks (chronic).  CAUSES Acute cough:  Viral or bacterial infections. Chronic cough:  Infections.  Allergies.  Asthma.  Post-nasal drip.  Smoking.  Heartburn or acid reflux.  Some medicines.  Chronic lung problems (COPD).  Cancer. SYMPTOMS   Cough.  Fever.  Chest pain.  Increased breathing rate.  High-pitched whistling sound when breathing (wheezing).  Colored mucus that you cough up (sputum). TREATMENT   A bacterial cough may be treated with antibiotic medicine.  A viral cough must run its course and will not respond to antibiotics.  Your caregiver may recommend other treatments if you have a chronic cough. HOME CARE INSTRUCTIONS   Only take over-the-counter or prescription medicines for pain, discomfort, or fever as directed by your caregiver. Use cough suppressants only as directed by your caregiver.  Use a cold steam vaporizer or humidifier in your bedroom or home to help loosen secretions.  Sleep in a semi-upright position if your cough is worse at night.  Rest as needed.  Stop smoking if you smoke. SEEK IMMEDIATE MEDICAL CARE IF:   You have pus in your sputum.  Your cough starts to worsen.  You cannot control your cough with suppressants and are losing sleep.  You begin coughing up blood.  You have difficulty breathing.  You develop pain which is getting worse or is uncontrolled with medicine.  You have a fever. MAKE SURE YOU:   Understand these instructions.  Will watch your condition.  Will get help right away if you are not doing well or get worse. Document Released: 12/14/2010 Document Revised: 09/09/2011 Document Reviewed: 12/14/2010 Exit[employer] Patient Information 2015 [employer]. This information is not intended  to replace advice given to you by your health care provider. Make sure you discuss any questions you have with your health care provider.  

## 2014-07-31 NOTE — ED Notes (Addendum)
States he does crack everyday

## 2014-07-31 NOTE — Discharge Instructions (Signed)
°  SEEK IMMEDIATE MEDICAL ATTENTION IF: ° °The pain becomes localized to portions of the abdomen. The right side could possibly be appendicitis. In an adult, the left lower portion of the abdomen could be colitis or diverticulitis.  °Blood is being passed in stools or vomit (bright red or black tarry stools).  °Return also if you develop chest pain, difficulty breathing, dizziness or fainting, or become confused, poorly responsive, or inconsolable. ° °

## 2014-07-31 NOTE — ED Notes (Signed)
Patient presents stating he has been coughing real bad which makes his chest hurt.  Was seen 2 times earlier today for cough

## 2014-07-31 NOTE — ED Provider Notes (Signed)
CSN: [id number]     Arrival date & time 07/31/14  2301 History   First MD Initiated Contact with Patient 07/31/14 2310     This chart was scribed for Loren Raceravid Amir Fick, MD by Arlan OrganAshley Leger, ED Scribe. This patient was seen in room D32C/D32C and the patient's care was started 11:19 PM.   Chief Complaint  Patient presents with  . Cough    HPI  HPI Comments: Sharolyn DouglasFredrick L Ritchie is a 54 y.o. male with a PMHx of DM, HTN, CHF, schizophenia, homelessness, and cocaine abuse who presents to the Emergency Department complaining of constant, moderate, non productive cough onset today. Pt also report upper chest when coughing. Pt was seen twice earlier today for same. No recent fever or chills. Pt is requesting cough medication this evening to help manage his symptoms and discharge home.   Past Medical History  Diagnosis Date  . Diabetes mellitus without complication   . Hypertension   . Schizophrenia   . CHF (congestive heart failure)   . Neuropathy   . Polysubstance abuse   . Cocaine abuse   . Homelessness    History reviewed. No pertinent past surgical history. Family History  Problem Relation Age of Onset  . Hypertension Other   . Diabetes Other    History  Substance Use Topics  . Smoking status: Current Every Day Smoker -- 1.00 packs/day for 20 years    Types: Cigarettes  . Smokeless tobacco: Current User  . Alcohol Use: Yes     Comment: Pt reports "on special occasions"    Review of Systems  Constitutional: Negative for fever and chills.  Respiratory: Positive for cough. Negative for shortness of breath.   Cardiovascular: Positive for chest pain. Negative for palpitations and leg swelling.  Gastrointestinal: Negative for nausea, vomiting and abdominal pain.  All other systems reviewed and are negative.     Allergies  Haldol  Home Medications   Prior to Admission medications   Medication Sig Start Date End Date Taking? Authorizing Provider  ARIPiprazole (ABILIFY) 5 MG  tablet Take 1 tablet (5 mg total) by mouth daily. 07/30/14   Ripudeep Jenna LuoK Rai, MD  aspirin EC 81 MG EC tablet Take 1 tablet (81 mg total) by mouth daily. 07/30/14   Ripudeep Jenna LuoK Rai, MD  benazepril (LOTENSIN) 5 MG tablet Take 1 tablet (5 mg total) by mouth daily. 07/30/14   Ripudeep Jenna LuoK Rai, MD  benztropine (COGENTIN) 2 MG tablet Take 1 tablet (2 mg total) by mouth 2 (two) times daily. 07/30/14   Ripudeep Jenna LuoK Rai, MD  cloNIDine (CATAPRES) 0.1 MG tablet Take 1 tablet (0.1 mg total) by mouth 2 (two) times daily. 07/30/14   Ripudeep Jenna LuoK Rai, MD  fluPHENAZine decanoate (PROLIXIN) 25 MG/ML injection Inject 1 mL (25 mg total) into the muscle every 14 (fourteen) days. Patient not taking: Reported on 06/24/2014 06/04/14   Beau FannyJohn C Withrow, FNP  furosemide (LASIX) 20 MG tablet Take 1 tablet (20 mg total) by mouth daily. 07/30/14   Ripudeep Jenna LuoK Rai, MD  metFORMIN (GLUCOPHAGE) 500 MG tablet Take 1 tablet (500 mg total) by mouth daily with breakfast. 07/30/14   Ripudeep Jenna LuoK Rai, MD  tamsulosin (FLOMAX) 0.4 MG CAPS capsule Take 1 capsule (0.4 mg total) by mouth daily after supper. 07/30/14   Ripudeep Jenna LuoK Rai, MD   Triage Vitals: BP 158/88 mmHg  Pulse 79  Temp(Src) 98.3 F (36.8 C) (Oral)  Resp 16  SpO2 97%   Physical Exam  Constitutional: He is oriented  to person, place, and time. He appears well-developed and well-nourished. No distress.  HENT:  Head: Normocephalic and atraumatic.  Mouth/Throat: Oropharynx is clear and moist.  Eyes: EOM are normal. Pupils are equal, round, and reactive to light.  Neck: Normal range of motion. Neck supple.  Cardiovascular: Normal rate and regular rhythm.  Exam reveals no gallop and no friction rub.   No murmur heard. Pulmonary/Chest: Effort normal and breath sounds normal. No respiratory distress. He has no wheezes. He has no rales. He exhibits no tenderness.  Lungs clear  Abdominal: Soft. Bowel sounds are normal. He exhibits no distension and no mass. There is no tenderness. There is no  rebound.  Musculoskeletal: Normal range of motion. He exhibits no edema or tenderness.  No calf swelling or tenderness  Neurological: He is alert and oriented to person, place, and time.  Skin: Skin is warm and dry. No rash noted. No erythema.  Psychiatric: He has a normal mood and affect. His behavior is normal.  Nursing note and vitals reviewed.   ED Course  Procedures (including critical care time)  ,DIAGNOSTIC STUDIES: Oxygen Saturation is 97% on RA, adequate by my interpretation.    COORDINATION OF CARE: 11:24 PM-Discussed treatment plan with pt at bedside and pt agreed to plan.     Labs Review Labs Reviewed - No data to display  Imaging Review No results found.   EKG Interpretation   Date/Time:  Sunday July 31 2014 23:02:54 EST Ventricular Rate:  85 PR Interval:  128 QRS Duration: 76 QT Interval:  408 QTC Calculation: 485 R Axis:   75 Text Interpretation:  Normal sinus rhythm Septal infarct , age  undetermined T wave abnormality, consider lateral ischemia Abnormal ECG  Confirmed by Ranae Palms  MD, Nanako Stopher ([id number]) on 07/31/2014 11:34:41 PM      MDM   Final diagnoses:  None    I personally performed the services described in this documentation, which was scribed in my presence. The recorded information has been reviewed and is accurate.  Patient primary complaint is a nonproductive cough. He is requesting cough syrup for the nonproductive cough. He has no shortness of breath. He complains of mild upper chest discomfort with coughing. This is very atypical for any type of ACS. The further workup is necessary. Lungs are clear. Imaging is unnecessary. No evidence of CHF. We'll give dose of guaifenesin and discharged home with same. Return precautions given.  Loren Racer, MD 07/31/14 [phone]

## 2014-08-02 ENCOUNTER — Emergency Department (HOSPITAL_COMMUNITY): Payer: Medicare Other

## 2014-08-02 ENCOUNTER — Encounter (HOSPITAL_COMMUNITY): Payer: Self-pay | Admitting: *Deleted

## 2014-08-02 ENCOUNTER — Emergency Department (HOSPITAL_COMMUNITY)
Admission: EM | Admit: 2014-08-02 | Discharge: 2014-08-02 | Disposition: A | Discharge status: Home / Self Care | Payer: Medicare Other | Attending: Emergency Medicine | Admitting: Emergency Medicine

## 2014-08-02 DIAGNOSIS — R05 Cough: Secondary | ICD-10-CM

## 2014-08-02 DIAGNOSIS — Z21 Asymptomatic human immunodeficiency virus [HIV] infection status: Secondary | ICD-10-CM | POA: Insufficient documentation

## 2014-08-02 DIAGNOSIS — I509 Heart failure, unspecified: Secondary | ICD-10-CM | POA: Insufficient documentation

## 2014-08-02 DIAGNOSIS — Z8619 Personal history of other infectious and parasitic diseases: Secondary | ICD-10-CM | POA: Insufficient documentation

## 2014-08-02 DIAGNOSIS — I10 Essential (primary) hypertension: Secondary | ICD-10-CM | POA: Insufficient documentation

## 2014-08-02 DIAGNOSIS — F209 Schizophrenia, unspecified: Secondary | ICD-10-CM | POA: Insufficient documentation

## 2014-08-02 DIAGNOSIS — E119 Type 2 diabetes mellitus without complications: Secondary | ICD-10-CM | POA: Insufficient documentation

## 2014-08-02 DIAGNOSIS — Z720 Tobacco use: Secondary | ICD-10-CM | POA: Insufficient documentation

## 2014-08-02 DIAGNOSIS — R059 Cough, unspecified: Secondary | ICD-10-CM

## 2014-08-02 DIAGNOSIS — M79671 Pain in right foot: Secondary | ICD-10-CM | POA: Insufficient documentation

## 2014-08-02 DIAGNOSIS — R000 Tachycardia, unspecified: Secondary | ICD-10-CM | POA: Insufficient documentation

## 2014-08-02 DIAGNOSIS — Z8669 Personal history of other diseases of the nervous system and sense organs: Secondary | ICD-10-CM | POA: Insufficient documentation

## 2014-08-02 DIAGNOSIS — F1490 Cocaine use, unspecified, uncomplicated: Secondary | ICD-10-CM

## 2014-08-02 DIAGNOSIS — F419 Anxiety disorder, unspecified: Secondary | ICD-10-CM | POA: Insufficient documentation

## 2014-08-02 DIAGNOSIS — Z8679 Personal history of other diseases of the circulatory system: Secondary | ICD-10-CM

## 2014-08-02 DIAGNOSIS — Z590 Homelessness: Secondary | ICD-10-CM | POA: Insufficient documentation

## 2014-08-02 DIAGNOSIS — F1410 Cocaine abuse, uncomplicated: Secondary | ICD-10-CM | POA: Insufficient documentation

## 2014-08-02 HISTORY — DX: Unspecified viral hepatitis C without hepatic coma: B19.20

## 2014-08-02 MED ORDER — ACETAMINOPHEN 325 MG PO TABS
650.0000 mg | ORAL_TABLET | Freq: Once | ORAL | Status: AC
  Administered 2014-08-02: 650 mg via ORAL
  Filled 2014-08-02: qty 2

## 2014-08-02 MED ORDER — BENAZEPRIL HCL 5 MG PO TABS: 5.0000 mg | ORAL_TABLET | Freq: Every day | ORAL | Status: DC

## 2014-08-02 MED ORDER — ASPIRIN 81 MG PO TBEC: 81.0000 mg | DELAYED_RELEASE_TABLET | Freq: Every day | ORAL | Status: DC

## 2014-08-02 MED ORDER — BENZTROPINE MESYLATE 2 MG PO TABS: 2.0000 mg | ORAL_TABLET | Freq: Two times a day (BID) | ORAL | Status: DC

## 2014-08-02 MED ORDER — TAMSULOSIN HCL 0.4 MG PO CAPS: 0.4000 mg | ORAL_CAPSULE | Freq: Every day | ORAL | Status: DC

## 2014-08-02 MED ORDER — ARIPIPRAZOLE 5 MG PO TABS: 5.0000 mg | ORAL_TABLET | Freq: Every day | ORAL | Status: DC

## 2014-08-02 MED ORDER — FUROSEMIDE 20 MG PO TABS: 20.0000 mg | ORAL_TABLET | Freq: Every day | ORAL | Status: DC

## 2014-08-02 MED ORDER — METFORMIN HCL 500 MG PO TABS: 500.0000 mg | ORAL_TABLET | Freq: Every day | ORAL | Status: DC

## 2014-08-02 MED ORDER — CLONIDINE HCL 0.1 MG PO TABS: 0.1000 mg | ORAL_TABLET | Freq: Two times a day (BID) | ORAL | Status: DC

## 2014-08-02 NOTE — ED Notes (Signed)
Pt. and belongings wanded by security 

## 2014-08-02 NOTE — ED Provider Notes (Signed)
CSN: [id number]     Arrival date & time 08/02/14  0256 History   First MD Initiated Contact with Patient 08/02/14 [phone]     Chief Complaint  Patient presents with  . Shortness of Breath  . Medical Clearance  . Homeless    (Consider location/radiation/quality/duration/timing/severity/associated sxs/prior Treatment) HPI Comments: Patient is a well-known 54 year old male who presents to the emergency department by EMS for complaints of feeling choked, as though he could not breathe. Patient endorses crack cocaine use 2 hours prior to arrival. Patient also complaining of pain to the top of his right foot. He believes this is because his shoes were too tight. No known trauma or injury. He appears frustrated because he has been unable to get his medications filled from his most recent hospital admission. No complaints of SI/HI.  This is the patient's 35th visit to the emergency department in the last 6 months.  The history is provided by the patient. No language interpreter was used.    Past Medical History  Diagnosis Date  . Diabetes mellitus without complication   . Hypertension   . Schizophrenia   . CHF (congestive heart failure)   . Neuropathy   . Polysubstance abuse   . Cocaine abuse   . Homelessness   . HIV (human immunodeficiency virus infection)   . Hepatitis C    History reviewed. No pertinent past surgical history. Family History  Problem Relation Age of Onset  . Hypertension Other   . Diabetes Other    History  Substance Use Topics  . Smoking status: Current Every Day Smoker -- 1.00 packs/day for 20 years    Types: Cigarettes  . Smokeless tobacco: Current User  . Alcohol Use: Yes     Comment: Pt reports "on special occasions"    Review of Systems  Constitutional: Negative for fever.  Respiratory: Positive for shortness of breath.   Musculoskeletal: Positive for myalgias.  Neurological: Negative for syncope.  Psychiatric/Behavioral: Negative for suicidal ideas.   All other systems reviewed and are negative.   Allergies  Haldol  Home Medications   Prior to Admission medications   Medication Sig Start Date End Date Taking? Authorizing Provider  guaiFENesin (ROBITUSSIN) 100 MG/5ML liquid Take 5-10 mLs (100-200 mg total) by mouth every 4 (four) hours as needed for cough. 07/31/14  Yes Loren Racer, MD  ARIPiprazole (ABILIFY) 5 MG tablet Take 1 tablet (5 mg total) by mouth daily. 08/02/14   Olivia Mackie, MD  aspirin 81 MG EC tablet Take 1 tablet (81 mg total) by mouth daily. 08/02/14   Olivia Mackie, MD  benazepril (LOTENSIN) 5 MG tablet Take 1 tablet (5 mg total) by mouth daily. 08/02/14   Olivia Mackie, MD  benztropine (COGENTIN) 2 MG tablet Take 1 tablet (2 mg total) by mouth 2 (two) times daily. 08/02/14   Olivia Mackie, MD  cloNIDine (CATAPRES) 0.1 MG tablet Take 1 tablet (0.1 mg total) by mouth 2 (two) times daily. 08/02/14   Olivia Mackie, MD  fluPHENAZine decanoate (PROLIXIN) 25 MG/ML injection Inject 1 mL (25 mg total) into the muscle every 14 (fourteen) days. Patient not taking: Reported on 06/24/2014 06/04/14   Beau Fanny, FNP  furosemide (LASIX) 20 MG tablet Take 1 tablet (20 mg total) by mouth daily. 08/02/14   Olivia Mackie, MD  metFORMIN (GLUCOPHAGE) 500 MG tablet Take 1 tablet (500 mg total) by mouth daily with breakfast. 08/02/14   Olivia Mackie, MD  tamsulosin [facility])  0.4 MG CAPS capsule Take 1 capsule (0.4 mg total) by mouth daily after supper. 08/02/14   Olivia Mackie, MD   BP 169/75 mmHg  Pulse 107  Temp(Src) 98.1 F (36.7 C) (Oral)  Resp 25  SpO2 94%   Physical Exam  Constitutional: He is oriented to person, place, and time. He appears well-developed and well-nourished. No distress.  Nontoxic/nonseptic appearing  HENT:  Head: Normocephalic and atraumatic.  Eyes: Conjunctivae and EOM are normal. No scleral icterus.  Neck: Normal range of motion.  Cardiovascular: Regular rhythm and intact distal pulses.   Mild tachycardia.   Pulmonary/Chest: Effort normal and breath sounds normal. No respiratory distress. He has no wheezes. He has no rales.  Decreased BS in RLL. No tachypnea or dyspnea. Chest expansion symmetric.  Musculoskeletal: Normal range of motion.  No pitting edema in lower extremities.  Neurological: He is alert and oriented to person, place, and time. He exhibits normal muscle tone. Coordination normal.  Sensation to light touch intact. Patient wiggles all toes b/l. He ambulates with steady gait.  Skin: Skin is warm and dry. No rash noted. He is not diaphoretic. No erythema. No pallor.  Psychiatric: His speech is normal. His mood appears anxious. He is hyperactive. Thought content is paranoid. He expresses no homicidal and no suicidal ideation. He expresses no suicidal plans and no homicidal plans.  Nursing note and vitals reviewed.   ED Course  Procedures (including critical care time) Labs Review Labs Reviewed - No data to display  Imaging Review Dg Chest 2 View  08/02/2014   CLINICAL DATA:  Cough.  Patient reports choking episode.  EXAM: CHEST  2 VIEW  COMPARISON:  07/28/2014  FINDINGS: Cardiomediastinal contours are unchanged. There are increaseing interstitial opacities. No confluent airspace disease. No pleural effusion or pneumothorax. Unchanged hyperinflation from prior. No acute osseous abnormalities.  IMPRESSION: Increasing interstitial opacities, may reflect atypical infection versus pulmonary edema. Stable hyperinflation.   Electronically Signed   By: Rubye Oaks M.D.   On: 08/02/2014 06:37     EKG Interpretation   Date/Time:  Tuesday August 02 2014 05:12:31 EST Ventricular Rate:  101 PR Interval:  126 QRS Duration: 79 QT Interval:  375 QTC Calculation: 486 R Axis:   49 Text Interpretation:  Sinus tachycardia Anterior infarct, old HEART RATE  INCREASED SINCE Confirmed by OTTER  MD, OLGA ([id number]) on 08/02/2014 6:39:16  AM      MDM   Final diagnoses:  Cough  Crack cocaine  use  History of CHF (congestive heart failure)  Right foot pain    54 year old male well-known to the emergency department presents today for further evaluation of a cough and right foot pain. He has no tachypnea or dyspnea. Lungs with mild decreased breath sounds in the right lower lobe; otherwise unremarkable. Patient endorses use of crack cocaine 2 hours prior to arrival. This is likely the cause of his mild tachycardia. No evidence of injury to right foot. Patient is neurovascularly intact. No indication for further emergent workup or imaging.  Chest x-ray obtained which shows a mild increase in interstitial opacities. This is likely secondary to patient's continued crack cocaine use despite being advised to stop using illicit drugs. Patient is in no acute respiratory distress. He has no signs of fluid overload on physical exam; no pitting edema. Patient's medications from his most recent admission have been refilled by Dr. Norlene Campbell. Do not believe the patient warrants further emergent workup or admission at this time. [facility]. Return precautions provided and patient agreeable to plan with no unaddressed concerns.   Filed Vitals:   08/02/14 0304 08/02/14 0515  BP: 187/96 169/75  Pulse: 108 107  Temp: 98.1 F (36.7 C)   TempSrc: Oral   Resp: 22 25  SpO2: 100% 94%     Antony MaduraKelly Sheryle Vice, PA-C 08/02/14 [id number]  Olivia Mackielga M Otter, MD 08/02/14 [phone]

## 2014-08-02 NOTE — ED Notes (Signed)
Check pt inventory sheet filled out on 08/01/2014

## 2014-08-02 NOTE — ED Notes (Signed)
Patient was given his personal belongings at bedside which consisted of one pair of jeans, one t-shirt, one pair of shoes, a small bible, and a copy of his identification card. He refused to take the clothing and only removed the bible and identification card from the bag. He stated, " it's trash I'm not taking it". The clothes were saturated in urine. Charge nurse present when patient was asked to take his belongings.

## 2014-08-02 NOTE — Discharge Instructions (Signed)
Discontinue your use of crack cocaine!!! Crack is whack and it will kill you!!  Recommend that you follow-up with a primary care doctor as soon as possible. Take your prescribed medications. Also follow-up with [facility] outpatient clinic regarding her schizophrenia.  Stimulant Use Disorder-Cocaine Cocaine is one of a group of powerful drugs called stimulants. Cocaine has medical uses for stopping nosebleeds and for pain control before minor nose or dental surgery. However, cocaine is misused because of the effects that it produces. These effects include:   A feeling of extreme pleasure.  Alertness.  High energy. Common street names for cocaine include coke, crack, blow, snow, and nose candy. Cocaine is snorted, dissolved in water and injected, or smoked.  Stimulants are addictive because they activate regions of the brain that produce both the pleasurable sensation of "reward" and psychological dependence. Together, these actions account for loss of control and the rapid development of drug dependence. This means you become ill without the drug (withdrawal) and need to keep using it to function.  Stimulant use disorder is use of stimulants that disrupts your daily life. It disrupts relationships with family and friends and how you do your job. Cocaine increases your blood pressure and heart rate. It can cause a heart attack or stroke. Cocaine can also cause death from irregular heart rate or seizures. SYMPTOMS Symptoms of stimulant use disorder with cocaine include:  Use of cocaine in larger amounts or over a longer period of time than intended.  Unsuccessful attempts to cut down or control cocaine use.  A lot of time spent obtaining, using, or recovering from the effects of cocaine.  A strong desire or urge to use cocaine (craving).  Continued use of cocaine in spite of major problems at work, school, or home because of use.  Continued use of cocaine in spite of relationship problems  because of use.  Giving up or cutting down on important life activities because of cocaine use.  Use of cocaine over and over in situations when it is physically hazardous, such as driving a car.  Continued use of cocaine in spite of a physical problem that is likely related to use. Physical problems can include:  Malnutrition.  Nosebleeds.  Chest pain.  High blood pressure.  A hole that develops between the part of your nose that separates your nostrils (perforated nasal septum).  Lung and kidney damage.  Continued use of cocaine in spite of a mental problem that is likely related to use. Mental problems can include:  Schizophrenia-like symptoms.  Depression.  Bipolar mood swings.  Anxiety.  Sleep problems.  Need to use more and more cocaine to get the same effect, or lessened effect over time with use of the same amount of cocaine (tolerance).  Having withdrawal symptoms when cocaine use is stopped, or using cocaine to reduce or avoid withdrawal symptoms. Withdrawal symptoms include:  Depressed or irritable mood.  Low energy or restlessness.  Bad dreams.  Poor or excessive sleep.  Increased appetite. DIAGNOSIS Stimulant use disorder is diagnosed by your health care provider. You may be asked questions about your cocaine use and how it affects your life. A physical exam may be done. A drug screen may be ordered. You may be referred to a mental health professional. The diagnosis of stimulant use disorder requires at least two symptoms within 12 months. The type of stimulant use disorder depends on the number of signs and symptoms you have. The type may be:  Mild. Two or three signs  and symptoms.  Moderate. Four or five signs and symptoms.  Severe. Six or more signs and symptoms. TREATMENT Treatment for stimulant use disorder is usually provided by mental health professionals with training in substance use disorders. The following options are  available:  Counseling or talk therapy. Talk therapy addresses the reasons you use cocaine and ways to keep you from using again. Goals of talk therapy include:  Identifying and avoiding triggers for use.  Handling cravings.  Replacing use with healthy activities.  Support groups. Support groups provide emotional support, advice, and guidance.  Medicine. Certain medicines may decrease cocaine cravings or withdrawal symptoms. HOME CARE INSTRUCTIONS  Take medicines only as directed by your health care provider.  Identify the people and activities that trigger your cocaine use and avoid them.  Keep all follow-up visits as directed by your health care provider. SEEK MEDICAL CARE IF:  Your symptoms get worse or you relapse.  You are not able to take medicines as directed. SEEK IMMEDIATE MEDICAL CARE IF:  You have serious thoughts about hurting yourself or others.  You have a seizure, chest pain, sudden weakness, or loss of speech or vision. FOR MORE INFORMATION  National Institute on Drug Abuse: http://www.price-smith.com/www.drugabuse.gov  Substance Abuse and Mental Health Services Administration: SkateOasis.com.ptwww.samhsa.gov Document Released: 06/14/2000 Document Revised: 11/01/2013 Document Reviewed: 06/30/2013 [facility] Patient Information 2015 [place], [place]. This information is not intended to replace advice given to you by your health care provider. Make sure you discuss any questions you have with your health care provider.   Emergency Department Resource Guide 1) Find a Doctor and Pay Out of Pocket Although you won't have to find out who is covered by your insurance plan, it is a good idea to ask around and get recommendations. You will then need to call the office and see if the doctor you have chosen will accept you as a new patient and what types of options they offer for patients who are self-pay. Some doctors offer discounts or will set up payment plans for their patients who do not have insurance, but you  will need to ask so you aren't surprised when you get to your appointment.  2) Contact Your Local Health Department Not all health departments have doctors that can see patients for sick visits, but many do, so it is worth a call to see if yours does. If you don't know where your local health department is, you can check in your phone book. The CDC also has a tool to help you locate your state's health department, and many state websites also have listings of all of their local health departments.  3) Find a Walk-in Clinic If your illness is not likely to be very severe or complicated, you may want to try a walk in clinic. These are popping up all over the country in pharmacies, drugstores, and shopping centers. They're usually staffed by nurse practitioners or physician assistants that have been trained to treat common illnesses and complaints. They're usually fairly quick and inexpensive. However, if you have serious medical issues or chronic medical problems, these are probably not your best option.  No Primary Care Doctor: - Call Health Connect at  [phone] - they can help you locate a primary care doctor that  accepts your insurance, provides certain services, etc. - Physician Referral Service- [phone]  Chronic Pain Problems: Organization         Address  Phone   Notes  [facility]  [phone] Patients  need to be referred by their primary care doctor.   Medication Assistance: Organization         Address  Phone   Notes  [facility] Med[facility] [address] [place] [address] [place], [place] [address] [phone] --Must be a resident of [facility] -- Must have NO insurance coverage whatsoever (no Medicaid/ Medicare, etc.) -- The pt. MUST have a primary care doctor that directs their care regularly and follows them in the community   [facility]ist  [phone]   [employer]  [phone]    Agencies that provide inexpensive medical  care: Organization         Address  Phone   Notes  [facility]  [phone]   [facility]    [phone]   [facility] [address] [place], [place] [address] [phone]   [facility] [address] [place] [phone]   Planned Parenthood    [phone]   [facility]    [phone]   [facility]  [address], [place] Phone:  [phone], Fax:  [phone] Hours of Operation:  9 am - 6 pm, M-F.  Also accepts Medicaid/Medicare and self-pay.  [facility]  [address], [place] Phone: [phone], Fax: [phone]. Hours of Operation:  8:30 am - 5:30 pm, M-F.  Also accepts Medicaid and self-pay.  [facility] [place] [address], [place] Phone: [phone]   [facility] [address] Natasha Bence [place], [place] [phone], Ext. [phone] Mondays & Thursdays: 7-9 AM.  First 15 patients are seen on a first come, first serve basis.    Medicaid-accepting [facility] Providers:  Organization         Address  Phone   Notes  [facility] [address], [place] [phone] Also accepts self-pay patients.  [facility] [address] Laurell Josephs [place], [place]  [phone]   [facility] [address], [place] [phone]   [facility] [address] [place] [phone]   Renaye Rakers [address], [place]   [phone] Only accepts [place] Access [place] patients after they have their name applied to their card.   Self-Pay (no insurance) in Outpatient Plastic Surgery Center:  Organization         Address  Phone   Notes  Sickle Cell Patients, [facility] [address] [place], [place] [phone]   [facility] [address] [place],  [place] [phone]   [facility] [place]  [address], [place] [phone]   [facility]/Dr. Osei-Bonsu  [address] [place] or [address], [place] [phone] Phone number for both [place] and [place] locations is the same.  [facility]gent Medical and [facility] [address] [place] [phone]   [facility] [address], [place] or [address] [phone]   [facility] [address], [place] [phone], phone; [phone], fax Sees patients 1st and 3rd Saturday of every month.  Must not qualify for public or private insurance (i.e. Medicaid, Medicare,  Health Choice, Veterans' Benefits)  Household income  should be no more than 200% of the poverty level The clinic cannot treat you if you are pregnant or think you are pregnant  Sexually transmitted diseases are not treated at the clinic.    Dental Care: Organization         Address  Phone  Notes  [facility] Department of [facility] [address] [place], [place] [phone] Accepts children up to age 66 who are enrolled in [place] or [place] Health Choice; pregnant women with a Medicaid card; and children who have applied for Medicaid or [place] Health Choice, but were declined, whose parents can pay a reduced fee at time of service.  [facility] Department of [facility]  [address], [place] [phone] Accepts children up to age 35 who are enrolled in [place] or [place] Health Choice; pregnant women with a Medicaid card; and children who have applied for Medicaid or  Health Choice, but were declined, whose parents can pay a reduced fee at time of service.  [facility]  [address] [place], [place] [phone] Patients are seen by appointment only. Walk-ins are not accepted. [facility] will see patients 88 years of age and older. Monday - Tuesday (8am-5pm) Most Wednesdays (8:30-5pm) $30 per visit, cash only  [facility] Adult Dental Access PROGRAM  [address], [facility] [phone] Patients are seen by appointment only. Walk-ins are not accepted. [facility] will see patients 57 years of age and older. One Wednesday Evening (Monthly: Volunteer Based).  $30 per visit, cash only  [employer]  [phone] for adults; Children under age 1, call Graduate Pediatric Dentistry at [phone]. Children aged 21-14, please call [phone] to request a pediatric application.  Dental services are provided in all areas of dental care including fillings, crowns and bridges, complete and partial dentures, implants, gum treatment, root canals, and extractions. Preventive care is also provided. Treatment is provided to both adults and children. Patients are selected via a lottery and there is often a waiting list.   [facility] [address], [place]  [phone] [email or web address]   [facility] [address] [place], [place] [phone], Ext. [phone] Second and Fourth Thursday of each month, opens at 6:30 AM; Clinic ends at 9 AM.  Patients are seen on a first-come first-served basis, and a limited number are seen during each clinic.   [facility]  [address] Ether Griffins [place], [place] [phone]   Eligibility Requirements You must have lived in [place], [place], or [place] counties for at least the last three months.   You cannot be eligible for state or federal sponsored [place], including [employer], [place], or [employer].   You generally cannot be eligible for healthcare insurance through your employer.    How to apply: Eligibility screenings are held every Tuesday and Wednesday afternoon from 1:00 pm until 4:00 pm. You do not need an appointment for the interview!   [facility] [address], [place], [place] [phone]   [facility] Health Department  [phone]   [facility] Health Department  [phone]   [facility] Health Department  [phone]    Behavioral Health Resources in the Community: Intensive Outpatient Programs Organization         Address  Phone  Notes  [facility] [address] [place], [place] [phone]  [facility] Outpatient [address] [place], [place] [phone]   [facility] [address], [place], [place]  [phone]   [facility] Mental Health [address],  [place], [place] [phone] or [phone]   Substance Abuse Resources Organization         Address  Phone  Notes  [facility] Drug Services  [phone]   [facility]  [phone]   [facility]  [phone]   Floydene Flock  [phone]   Residential & Outpatient Substance Abuse Program  [phone]   Psychological Services Organization         Address  Phone  Notes  [facility] Behavioral Health  [phone]   [facility]  [phone]   [facility] Mental Health [address], [place] [phone] or [phone]    Mobile Crisis Teams Organization         Address  Phone  Notes  [facility], Mobile Crisis Care Unit  [phone]   [facility]  [address] [place], [place] [phone]   Doristine Locks [address] [place] [phone]    Self-Help/Support Groups Organization         Address  Phone             Notes  Mental Health Assoc. of  - variety of support groups  [phone]- I7437963 Call for more information  Narcotics Anonymous (NA), [facility] [address], [employer] [place]  2 meetings at this location   Statistician         Address  Phone  Notes  [facility] [address],    [place]  [phone]   [facility]  [address] [place] [address], [place], [place] [phone]   [facility] Treatment Facility [address] [place], [place] [phone] Admissions: 8am-3pm M-F  [facility] [address]    [place], [place] [phone]   [facility] [address] [place], [place], [place] [phone]   [facility] [address]  [place], [place] [phone]   [facility] - Intensive Outpatient [address] Laurell Joseph[address], [place], [place] [phone]   [facility] [address] [place]  [place], [place] [phone] or [phone]   [facility] ([facility]) [address], [place], [place] [phone] Accepts Medicaid  [facility] [place] [address],  [place] [phone] Substance Abuse/Addiction Treatment   [facility] Organization         Address  Phone  Notes  [facility]  [phone]   Angie Fava, PhD [address] Ervin Knack [place], [place]   [phone] or [phone]   [facility]   [address] [place], [place] [phone]   [facility] [address], [place], [place] [phone] Insurance/Medicaid/sponsorship through [facility] and Families [address]                                    [place], [place] [phone] Therapy/tele-psych/case  [facility] [address], [place] [phone]    Dr. Lolly Mustache  [phone]   [facility]  [employer] [facility] Dept. 1) [address], [place] 2) [address], [place] 3)  [address], [place] [phone]  [phone]   [facility] Child Abuse Hotline [phone] or [phone] (After Hours)

## 2014-08-02 NOTE — ED Notes (Signed)
Bed: [id number] Expected date:  Expected time:  Means of arrival:  Comments: EMS 54 yo male hallucinations/schizophrenia

## 2014-08-02 NOTE — ED Notes (Signed)
At time of DC pt not accepting of plan of care. Pt refused VS as well as to sign instruction

## 2014-08-02 NOTE — ED Notes (Signed)
Per EMS pt homeless, [facility], couldn't get prescriptions filled, was found wandering and stated he needed to come here d/t choking, pt has schizo behavior, 2 hours ago used crack cocaine.

## 2014-08-10 ENCOUNTER — Inpatient Hospital Stay (HOSPITAL_COMMUNITY)
Admission: EM | Admit: 2014-08-10 | Discharge: 2014-08-12 | DRG: 885 | Disposition: A | Discharge status: Home / Self Care | Payer: Medicare Other | Source: Intra-hospital | Attending: Psychiatry | Admitting: Psychiatry

## 2014-08-10 ENCOUNTER — Encounter (HOSPITAL_COMMUNITY): Payer: Self-pay | Admitting: Registered Nurse

## 2014-08-10 ENCOUNTER — Emergency Department (HOSPITAL_COMMUNITY)
Admission: EM | Admit: 2014-08-10 | Discharge: 2014-08-10 | Disposition: A | Discharge status: Other Acute Inpatient Hospital | Payer: Medicare Other | Source: Home / Self Care | Attending: Emergency Medicine | Admitting: Emergency Medicine

## 2014-08-10 ENCOUNTER — Encounter (HOSPITAL_COMMUNITY): Payer: Self-pay | Admitting: Emergency Medicine

## 2014-08-10 DIAGNOSIS — F1994 Other psychoactive substance use, unspecified with psychoactive substance-induced mood disorder: Secondary | ICD-10-CM | POA: Diagnosis present

## 2014-08-10 DIAGNOSIS — R45851 Suicidal ideations: Secondary | ICD-10-CM

## 2014-08-10 DIAGNOSIS — F17210 Nicotine dependence, cigarettes, uncomplicated: Secondary | ICD-10-CM | POA: Diagnosis present

## 2014-08-10 DIAGNOSIS — F19929 Other psychoactive substance use, unspecified with intoxication, unspecified: Secondary | ICD-10-CM

## 2014-08-10 DIAGNOSIS — F209 Schizophrenia, unspecified: Secondary | ICD-10-CM | POA: Diagnosis present

## 2014-08-10 DIAGNOSIS — F1410 Cocaine abuse, uncomplicated: Secondary | ICD-10-CM | POA: Diagnosis present

## 2014-08-10 DIAGNOSIS — I10 Essential (primary) hypertension: Secondary | ICD-10-CM | POA: Diagnosis present

## 2014-08-10 DIAGNOSIS — Z21 Asymptomatic human immunodeficiency virus [HIV] infection status: Secondary | ICD-10-CM | POA: Diagnosis present

## 2014-08-10 DIAGNOSIS — F1420 Cocaine dependence, uncomplicated: Secondary | ICD-10-CM | POA: Diagnosis present

## 2014-08-10 DIAGNOSIS — Z590 Homelessness: Secondary | ICD-10-CM

## 2014-08-10 LAB — COMPREHENSIVE METABOLIC PANEL
ALT: 12 U/L (ref 0–53)
ANION GAP: 10 (ref 5–15)
AST: 24 U/L (ref 0–37)
Albumin: 4.5 g/dL (ref 3.5–5.2)
Alkaline Phosphatase: 117 U/L (ref 39–117)
BILIRUBIN TOTAL: 0.7 mg/dL (ref 0.3–1.2)
BUN: 13 mg/dL (ref 6–23)
CALCIUM: 9.9 mg/dL (ref 8.4–10.5)
CO2: 30 mmol/L (ref 19–32)
CREATININE: 1.02 mg/dL (ref 0.50–1.35)
Chloride: 101 mmol/L (ref 96–112)
GFR, EST NON AFRICAN AMERICAN: 82 mL/min — AB (ref >90)
Glucose, Bld: 126 mg/dL — ABNORMAL HIGH (ref 70–99)
POTASSIUM: 3.2 mmol/L — AB (ref 3.5–5.1)
Sodium: 141 mmol/L (ref 135–145)
Total Protein: 7.7 g/dL (ref 6.0–8.3)

## 2014-08-10 LAB — CBC WITH DIFFERENTIAL/PLATELET
BASOS PCT: 0 % (ref 0–1)
Basophils Absolute: 0 10*3/uL (ref 0.0–0.1)
Eosinophils Absolute: 0 10*3/uL (ref 0.0–0.7)
Eosinophils Relative: 0 % (ref 0–5)
HCT: 41.9 % (ref 39.0–52.0)
Hemoglobin: 13.3 g/dL (ref 13.0–17.0)
LYMPHS PCT: 7 % — AB (ref 12–46)
Lymphs Abs: 0.6 10*3/uL — ABNORMAL LOW (ref 0.7–4.0)
MCH: 28.6 pg (ref 26.0–34.0)
MCHC: 31.7 g/dL (ref 30.0–36.0)
MCV: 90.1 fL (ref 78.0–100.0)
Monocytes Absolute: 0.4 10*3/uL (ref 0.1–1.0)
Monocytes Relative: 4 % (ref 3–12)
Neutro Abs: 7.5 10*3/uL (ref 1.7–7.7)
Neutrophils Relative %: 89 % — ABNORMAL HIGH (ref 43–77)
PLATELETS: 284 10*3/uL (ref 150–400)
RBC: 4.65 MIL/uL (ref 4.22–5.81)
RDW: 13.8 % (ref 11.5–15.5)
WBC: 8.4 10*3/uL (ref 4.0–10.5)

## 2014-08-10 LAB — RAPID URINE DRUG SCREEN, HOSP PERFORMED
Amphetamines: NOT DETECTED
BARBITURATES: NOT DETECTED
Benzodiazepines: NOT DETECTED
Cocaine: POSITIVE — AB
Opiates: NOT DETECTED
Tetrahydrocannabinol: NOT DETECTED

## 2014-08-10 LAB — TROPONIN I: TROPONIN I: 0.03 ng/mL (ref <0.031)

## 2014-08-10 LAB — GLUCOSE, CAPILLARY: GLUCOSE-CAPILLARY: 147 mg/dL — AB (ref 70–99)

## 2014-08-10 LAB — ETHANOL: Alcohol, Ethyl (B): <5 mg/dL (ref 0–9)

## 2014-08-10 MED ORDER — ACETAMINOPHEN 325 MG PO TABS
650.0000 mg | ORAL_TABLET | ORAL | Status: DC | PRN
  Administered 2014-08-10 (×2): 650 mg via ORAL
  Filled 2014-08-10 (×2): qty 2

## 2014-08-10 MED ORDER — TAMSULOSIN HCL 0.4 MG PO CAPS
0.4000 mg | ORAL_CAPSULE | Freq: Every day | ORAL | Status: DC
  Administered 2014-08-10 – 2014-08-11 (×2): 0.4 mg via ORAL
  Filled 2014-08-10 (×3): qty 1

## 2014-08-10 MED ORDER — IBUPROFEN 400 MG PO TABS: 600.0000 mg | ORAL_TABLET | Freq: Three times a day (TID) | ORAL | Status: DC | PRN

## 2014-08-10 MED ORDER — METFORMIN HCL 500 MG PO TABS
500.0000 mg | ORAL_TABLET | Freq: Every day | ORAL | Status: DC
  Administered 2014-08-11 – 2014-08-12 (×2): 500 mg via ORAL
  Filled 2014-08-10 (×3): qty 1

## 2014-08-10 MED ORDER — ALUM & MAG HYDROXIDE-SIMETH 200-200-20 MG/5ML PO SUSP
30.0000 mL | ORAL | Status: DC | PRN
  Administered 2014-08-12 (×2): 30 mL via ORAL
  Filled 2014-08-10 (×2): qty 30

## 2014-08-10 MED ORDER — ARIPIPRAZOLE 5 MG PO TABS
5.0000 mg | ORAL_TABLET | Freq: Every day | ORAL | Status: DC
  Administered 2014-08-10 – 2014-08-11 (×2): 5 mg via ORAL
  Filled 2014-08-10 (×3): qty 1

## 2014-08-10 MED ORDER — MAGNESIUM HYDROXIDE 400 MG/5ML PO SUSP: 30.0000 mL | Freq: Every day | ORAL | Status: DC | PRN

## 2014-08-10 MED ORDER — ONDANSETRON HCL 4 MG PO TABS: 4.0000 mg | ORAL_TABLET | Freq: Three times a day (TID) | ORAL | Status: DC | PRN

## 2014-08-10 MED ORDER — POTASSIUM CHLORIDE CRYS ER 20 MEQ PO TBCR
20.0000 meq | EXTENDED_RELEASE_TABLET | Freq: Two times a day (BID) | ORAL | Status: AC
  Administered 2014-08-10 – 2014-08-12 (×4): 20 meq via ORAL
  Filled 2014-08-10 (×7): qty 1

## 2014-08-10 MED ORDER — ASPIRIN EC 81 MG PO TBEC
81.0000 mg | DELAYED_RELEASE_TABLET | Freq: Every day | ORAL | Status: DC
  Administered 2014-08-10 – 2014-08-12 (×3): 81 mg via ORAL
  Filled 2014-08-10 (×4): qty 1

## 2014-08-10 MED ORDER — ACETAMINOPHEN 325 MG PO TABS
650.0000 mg | ORAL_TABLET | Freq: Four times a day (QID) | ORAL | Status: DC | PRN
  Administered 2014-08-10 – 2014-08-12 (×6): 650 mg via ORAL
  Filled 2014-08-10 (×6): qty 2

## 2014-08-10 MED ORDER — FUROSEMIDE 20 MG PO TABS
20.0000 mg | ORAL_TABLET | Freq: Every day | ORAL | Status: DC
  Administered 2014-08-10 – 2014-08-12 (×3): 20 mg via ORAL
  Filled 2014-08-10 (×4): qty 1

## 2014-08-10 MED ORDER — NICOTINE 21 MG/24HR TD PT24
21.0000 mg | MEDICATED_PATCH | Freq: Every day | TRANSDERMAL | Status: DC
  Administered 2014-08-11 – 2014-08-12 (×2): 21 mg via TRANSDERMAL
  Filled 2014-08-10 (×4): qty 1

## 2014-08-10 MED ORDER — LORAZEPAM 1 MG PO TABS
1.0000 mg | ORAL_TABLET | Freq: Three times a day (TID) | ORAL | Status: DC | PRN
  Administered 2014-08-10: 1 mg via ORAL
  Filled 2014-08-10: qty 1

## 2014-08-10 MED ORDER — ALUM & MAG HYDROXIDE-SIMETH 200-200-20 MG/5ML PO SUSP: 30.0000 mL | ORAL | Status: DC | PRN

## 2014-08-10 MED ORDER — CLONIDINE HCL 0.1 MG PO TABS
0.1000 mg | ORAL_TABLET | Freq: Two times a day (BID) | ORAL | Status: DC
  Administered 2014-08-10 – 2014-08-12 (×4): 0.1 mg via ORAL
  Filled 2014-08-10 (×7): qty 1

## 2014-08-10 MED ORDER — TRAZODONE HCL 50 MG PO TABS
50.0000 mg | ORAL_TABLET | Freq: Every evening | ORAL | Status: DC | PRN
  Administered 2014-08-10 – 2014-08-11 (×2): 50 mg via ORAL
  Filled 2014-08-10: qty 4
  Filled 2014-08-10 (×3): qty 1

## 2014-08-10 MED ORDER — BENAZEPRIL HCL 5 MG PO TABS
5.0000 mg | ORAL_TABLET | Freq: Every day | ORAL | Status: DC
  Administered 2014-08-10 – 2014-08-12 (×3): 5 mg via ORAL
  Filled 2014-08-10 (×4): qty 1

## 2014-08-10 MED ORDER — BENZTROPINE MESYLATE 2 MG PO TABS
2.0000 mg | ORAL_TABLET | Freq: Two times a day (BID) | ORAL | Status: DC
  Administered 2014-08-10 – 2014-08-12 (×4): 2 mg via ORAL
  Filled 2014-08-10 (×6): qty 1

## 2014-08-10 NOTE — ED Notes (Signed)
Patient sts he isnt going to give us any problesm because it is cold outside.

## 2014-08-10 NOTE — Progress Notes (Signed)
Adult Psychoeducational Group Note  Date:  08/10/2014 Time:  9:59 PM  Group Topic/Focus:  Wrap-Up Group:   The focus of this group is to help patients review their daily goal of treatment and discuss progress on daily workbooks.  Participation Level:  Minimal  Participation Quality:  Appropriate  Affect:  Flat  Cognitive:  Lacking  Insight: Limited  Engagement in Group:  Limited  Modes of Intervention:  Socialization and Support  Additional Comments:  Patient attended and participated in group tonight. He reports having a good day. He has been having a better year than last year. Last year he was arrested 40 times and since the year started he has been arrested only 5 times. He advised that he has friends, family and people helping him. He is thankful. He has been eating and attending his groups.  Lita MainsFrancis, Angeleen Horney [facility] 08/10/2014, 9:59 PM

## 2014-08-10 NOTE — ED Notes (Addendum)
Report given to [facility] RN in pod c, [facility] TO BE transported around 8 am accepted by dr Elna Bresloweappen

## 2014-08-10 NOTE — Progress Notes (Addendum)
Pt is a 54 y/o AAM with h/o of Schizophrenia. Per report and chart-- pt stated he's been having VH "seeing his enemies", initially he wanted to kill them, however, now he wants to kill himself by shooting self or slitting his throat. Pt alert,  presents with flat affect, pressured speech and irritable mood on initial contact. Verbally contracts for safety while hospitalized, denied HI, AVH at this time. Pt confirms h/o crack / cocaine use, last used 08/09/14 at 3pm [facility]. Skin Assessment completed per protocol, skin is dry with multiple cons and calluses noted on bilateral feet. [name] reported pain 7/10 in his feet, he walks with a limb. PRN Tylenol was given for pain on arrival to unit and pt reported relief. CBG  Done 147 mg/dl. Pt denied s/s of hypo / hyperglycemic reactions. Per pt he's also been experiencing urinary incontinence for which he has received treatment for but did not fill his prescription. Unit orientation and schedules discussed with pt, understanding verbalized. Safety maintained on Q 15 minutes checks as ordered and pt monitored as such without gestures to harm self or behavioral outburst to note at present.

## 2014-08-10 NOTE — ED Notes (Signed)
Staffing office notified for pt.'s sitter . 

## 2014-08-10 NOTE — BH Assessment (Signed)
Per Donell SievertSpencer Simon, PA pt meets inpt criteria and [facility]. Per Bunnie Pionori AC, pt can arrive after 8 am.Pt is going to room 507-1 under the care of Dr. Elna BreslowEappen.  Dr. Judd Lienelo is in agreement and will complete transfer.   Informed Lequita HaltMorgan , RN who will inform pt.    Clista BernhardtNancy Dymphna Wadley, [facility] Triage Specialist 08/10/2014 5:34 AM

## 2014-08-10 NOTE — BHH Suicide Risk Assessment (Signed)
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 Acute CHF (congestive heart failure) [I50.9] 07/29/2014  . Urinary retention [R33.9]   . Cocaine use disorder, severe, dependence [F14.20]   . Schizophrenia, chronic condition with acute exacerbation [F20.9] 05/13/2014  . Essential hypertension, benign [I10] 03/28/2013  . Diabetes mellitus [E11.9] 03/15/2013     Continued Clinical Symptoms:    The "Alcohol Use Disorders Identification Test", Guidelines for Use in Primary Care, Second Edition.  World Science writer [facility]). Score between 0-7:  no or low risk or alcohol related problems. Score between 8-15:  moderate risk of alcohol related problems. Score between 16-19:  high risk of alcohol related problems. Score 20 or above:  warrants further diagnostic evaluation for alcohol dependence and treatment.   CLINICAL FACTORS:   Alcohol/Substance Abuse/Dependencies Unstable or Poor Therapeutic Relationship Previous Psychiatric Diagnoses and Treatments Medical Diagnoses and Treatments/Surgeries   Musculoskeletal: Strength & Muscle Tone: within normal limits Gait & Station: normal Patient leans: N/A  Psychiatric Specialty Exam: Physical Exam   Review of Systems  Psychiatric/Behavioral: Positive for suicidal ideas and hallucinations. The patient is nervous/anxious.     Blood pressure 134/72, pulse 90, temperature 98.2 F (36.8 C), temperature source Oral, resp. rate 20, height  (1.727 m), weight 74.844 kg (165 lb), SpO2 100 %.Body mass index is 25.09 kg/(m^2).  General Appearance: Fairly Groomed  Patent attorney::  Fair  Speech:  Clear and Coherent  Volume:  Normal  Mood:  Anxious  Affect:  Labile  Thought Process:  Coherent  Orientation:  Full (Time, Place, and Person)  Thought Content:  Hallucinations: Auditory  Suicidal Thoughts:  Yes.  without intent/plan  Homicidal Thoughts:  No  Memory:  Immediate;   Fair Recent;   Fair Remote;   Fair  Judgement:  Impaired  Insight:  Shallow  Psychomotor Activity:  Restlessness  Concentration:  Fair  Recall:  [employer] of Knowledge:Fair  Language: Fair  Akathisia:  No  Handed:  Right  AIMS (if indicated):     Assets:  Communication Skills  Sleep:     Cognition: WNL  ADL's:  Intact     COGNITIVE FEATURES THAT CONTRIBUTE TO RISK:  Closed-mindedness, Polarized thinking and Thought constriction (tunnel vision)    SUICIDE RISK:   Moderate:  Frequent suicidal ideation with limited intensity, and duration, some specificity in terms of plans, no associated intent, good self-control, limited dysphoria/symptomatology, some risk factors present, and identifiable protective factors, including available and accessible social support.  PLAN OF CARE: Patient is well known to [facility] team. Patient will be continued on Abilify 5 mg po daily. Reviewed Labs , will order as needed. CSW will work on disposition. Will restart home medications as needed.  Medical Decision Making:  Review of Psycho-Social Stressors (1), Review or order clinical lab tests (1), Decision to  obtain old records (1), Established Problem, Worsening (2), Review of Last Therapy Session (1), Review of Medication Regimen  & Side Effects (2) and Review of New Medication or Change in Dosage (2)  I certify that inpatient services furnished can reasonably be expected to improve the patient's condition.   Dearion Huot MD 08/10/2014, 4:01 PM
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
## 2014-08-10 NOTE — BH Assessment (Signed)
Requested cart be placed with pt for assessment.   36 visit in 6 months, hx of schizophrenia and cocaine abuse. Reports the last couple of days AVH getting worse, "seeing his enemies." Thinking of killing enemies then thought about killing himself via slitting his wrist or shooting himself. Pt complains of urinary incontinence, was evaluated for this but has failed to fill prescription. Last cocaine use was about 3 pm 08-09-14.  Clista BernhardtNancy Mannie Wineland, [facility] Triage Specialist 08/10/2014 4:17 AM

## 2014-08-10 NOTE — ED Notes (Signed)
TTS being performed at this time 

## 2014-08-10 NOTE — Progress Notes (Signed)
Patient ID: Sharolyn DouglasFredrick L Piech, male   DOB: Jun 30, 1961, 54 y.o.   MRN: [id number] PER STATE REGULATIONS 482.30  THIS CHART WAS REVIEWED FOR MEDICAL NECESSITY WITH RESPECT TO THE PATIENT'S ADMISSION/DURATION OF STAY.  NEXT REVIEW DATE: 08/14/14  Loura HaltBARBARA Libra Gatz, RN, BSN CASE MANAGER

## 2014-08-10 NOTE — ED Notes (Signed)
[place]. [place] reports cocaine use with suicidal ideation , urinary and bowel incontinence . Pt. did not disclose plan of suicide.

## 2014-08-10 NOTE — ED Provider Notes (Signed)
CSN: [id number]     Arrival date & time 08/10/14  0241 History   First MD Initiated Contact with Patient 08/10/14 [phone]     Chief Complaint  Patient presents with  . Suicidal  . Drug Problem  . Urinary Incontinence  . Encopresis     (Consider location/radiation/quality/duration/timing/severity/associated sxs/prior Treatment) HPI Comments: Patient is a 54 yo M presenting to the ED for evaluation of his suicidal ideations with plan to either shot himself or slit his throat. Patient states he has been hallucinating seeing "his enemies" and at first wanted to kill them, but then decided he wanted to kill himself. Patient endorses using crack cocaine at 3PM this evening, denies any CP or SOB. He is complaining he has not been able to get his medications filled for his incontinence so his symptoms have continued w/o new issue. He states he knows when he needs to have a BM, but often does not make it to the bathroom in time. He states he has one BM a day.    Past Medical History  Diagnosis Date  . Diabetes mellitus without complication   . Hypertension   . Schizophrenia   . CHF (congestive heart failure)   . Neuropathy   . Polysubstance abuse   . Cocaine abuse   . Homelessness   . HIV (human immunodeficiency virus infection)   . Hepatitis C    History reviewed. No pertinent past surgical history. Family History  Problem Relation Age of Onset  . Hypertension Other   . Diabetes Other    History  Substance Use Topics  . Smoking status: Current Every Day Smoker -- 1.00 packs/day for 20 years    Types: Cigarettes  . Smokeless tobacco: Current User  . Alcohol Use: Yes     Comment: Pt reports "on special occasions"    Review of Systems  Genitourinary: Positive for urgency and frequency.  Psychiatric/Behavioral: Positive for suicidal ideas, hallucinations and sleep disturbance.  All other systems reviewed and are negative.     Allergies  Haldol  Home Medications   Prior to  Admission medications   Medication Sig Start Date End Date Taking? Authorizing Provider  ARIPiprazole (ABILIFY) 5 MG tablet Take 1 tablet (5 mg total) by mouth daily. 08/02/14  Yes Olivia Mackie, MD  aspirin 81 MG EC tablet Take 1 tablet (81 mg total) by mouth daily. 08/02/14  Yes Olivia Mackie, MD  benazepril (LOTENSIN) 5 MG tablet Take 1 tablet (5 mg total) by mouth daily. 08/02/14  Yes Olivia Mackie, MD  benztropine (COGENTIN) 2 MG tablet Take 1 tablet (2 mg total) by mouth 2 (two) times daily. 08/02/14  Yes Olivia Mackie, MD  cloNIDine (CATAPRES) 0.1 MG tablet Take 1 tablet (0.1 mg total) by mouth 2 (two) times daily. 08/02/14  Yes Olivia Mackie, MD  fluPHENAZine decanoate (PROLIXIN) 25 MG/ML injection Inject 1 mL (25 mg total) into the muscle every 14 (fourteen) days. 06/04/14  Yes Beau Fanny, FNP  furosemide (LASIX) 20 MG tablet Take 1 tablet (20 mg total) by mouth daily. 08/02/14  Yes Olivia Mackie, MD  guaiFENesin (ROBITUSSIN) 100 MG/5ML liquid Take 5-10 mLs (100-200 mg total) by mouth every 4 (four) hours as needed for cough. 07/31/14  Yes Loren Racer, MD  metFORMIN (GLUCOPHAGE) 500 MG tablet Take 1 tablet (500 mg total) by mouth daily with breakfast. 08/02/14  Yes Olivia Mackie, MD  tamsulosin (FLOMAX) 0.4 MG CAPS capsule Take 1 capsule (0.4 mg  total) by mouth daily after supper. 08/02/14  Yes Olivia Mackie, MD   BP 188/91 mmHg  Pulse 91  Temp(Src) 98.3 F (36.8 C) (Oral)  Resp 20  Ht  (1.753 m)  Wt 174 lb (78.926 kg)  BMI 25.68 kg/m2  SpO2 99% Physical Exam  Constitutional: He is oriented to person, place, and time. He appears well-developed and well-nourished. No distress.  HENT:  Head: Normocephalic and atraumatic.  Right Ear: External ear normal.  Left Ear: External ear normal.  Nose: Nose normal.  Mouth/Throat: Oropharynx is clear and moist. No oropharyngeal exudate.  Eyes: Conjunctivae are normal.  Neck: Normal range of motion. Neck supple.  Cardiovascular: Normal rate, regular  rhythm and normal heart sounds.   Pulmonary/Chest: Effort normal and breath sounds normal. No respiratory distress.  Abdominal: Soft. Normal appearance and bowel sounds are normal. There is no tenderness. There is no rigidity, no rebound and no guarding.  Musculoskeletal: Normal range of motion. He exhibits no edema.  Neurological: He is alert and oriented to person, place, and time.  Skin: Skin is warm and dry. He is not diaphoretic.  Psychiatric: He has a normal mood and affect. His speech is rapid and/or pressured. He is actively hallucinating. He expresses homicidal and suicidal ideation. He expresses suicidal plans.  Nursing note and vitals reviewed.   ED Course  Procedures (including critical care time) Medications  LORazepam (ATIVAN) tablet 1 mg (not administered)  acetaminophen (TYLENOL) tablet 650 mg (650 mg Oral Given 08/10/14 0416)  ibuprofen (ADVIL,MOTRIN) tablet 600 mg (not administered)  ondansetron (ZOFRAN) tablet 4 mg (not administered)  alum & mag hydroxide-simeth (MAALOX/MYLANTA) 200-200-20 MG/5ML suspension 30 mL (not administered)    Labs Review Labs Reviewed  URINE RAPID DRUG SCREEN (HOSP PERFORMED) - Abnormal; Notable for the following:    Cocaine POSITIVE (*)    All other components within normal limits  CBC WITH DIFFERENTIAL/PLATELET - Abnormal; Notable for the following:    Neutrophils Relative % 89 (*)    Lymphocytes Relative 7 (*)    Lymphs Abs 0.6 (*)    All other components within normal limits  COMPREHENSIVE METABOLIC PANEL - Abnormal; Notable for the following:    Potassium 3.2 (*)    Glucose, Bld 126 (*)    GFR calc non Af Amer 82 (*)    All other components within normal limits  ETHANOL  TROPONIN I    Imaging Review No results found.   EKG Interpretation   Date/Time:  Wednesday August 10 2014 04:09:04 EST Ventricular Rate:  100 PR Interval:  129 QRS Duration: 73 QT Interval:  336 QTC Calculation: 433 R Axis:   82 Text  Interpretation:  Sinus tachycardia Anteroseptal infarct, old Abnormal  T, consider ischemia, lateral leads Confirmed by DELOS  MD, DOUGLAS  ([id number]) on 08/10/2014 4:24:20 AM      MDM   Final diagnoses:  Suicidal ideations    Filed Vitals:   08/10/14 0248  BP: 188/91  Pulse: 91  Temp: 98.3 F (36.8 C)  Resp: 20   Afebrile, NAD, non-toxic appearing, AAOx4.  I have reviewed nursing notes, vital signs, and all appropriate lab and imaging results for this patient. Pt presents to the ED for medical clearance.  Pt is currently having SI ideations. Pt has a plan.   The patient currently does not have any acute physical complaints (same urinary complaints from previous ED visits) and is in no acute distress. TTS consult was appreciated and pt was moved to  Psych ED for further evaluation.  Patient d/w with Dr. Judd Lienelo, agrees with plan.        Jeannetta EllisJennifer L Denzel Etienne, PA-C 08/10/14 0530  Geoffery Lyonsouglas Delo, MD 08/11/14 [phone]

## 2014-08-10 NOTE — Tx Team (Signed)
Initial Interdisciplinary Treatment Plan   PATIENT STRESSORS: Health problems Substance abuse   PATIENT STRENGTHS: Ability for insight Average or above average intelligence Capable of independent living Communication skills Financial means   PROBLEM LIST: Problem List/Patient Goals Date to be addressed Date deferred Reason deferred Estimated date of resolution  Alteration in mood   07/10/14     Alteration in thought process (hallucinations)  07/10/14     Substance abuse 07/10/14                                          DISCHARGE CRITERIA:  Ability to meet basic life and health needs Adequate post-discharge living arrangements Improved stabilization in mood, thinking, and/or behavior Medical problems require only outpatient monitoring Motivation to continue treatment in a less acute level of care Verbal commitment to aftercare and medication compliance  PRELIMINARY DISCHARGE PLAN: Outpatient therapy Return to previous living arrangement  PATIENT/FAMIILY INVOLVEMENT: This treatment plan has been presented to and reviewed with the patient, Sharolyn DouglasFredrick L Adolf. Sherryl MangesWesseh, Rishikesh Khachatryan 08/10/2014, 4:53 PM

## 2014-08-10 NOTE — [facility] Group Notes (Signed)
[facility] LCSW Group Therapy  08/10/2014 1:36 PM  Type of Therapy:  Group Therapy  Participation Level:  Did Not Attend  Modes of Intervention:  Discussion, Socialization and Support  Summary of Progress/Problems:[employer] [facility]) speaker came to talk about his personal journey with substance abuse and mental illness. Group members were challenged to process ways by which to relate to the speaker. MHA speaker provided handouts and educational information pertaining to groups and services [facility] by [facility].   [name],Taylor 08/10/2014, 1:36 PM

## 2014-08-10 NOTE — ED Notes (Signed)
[facility] called transport. 

## 2014-08-10 NOTE — BH Assessment (Addendum)
Tele Assessment Note   [name] is an 54 y.o. male. Called EMS to bring to ED. Pt reports AVH, SI with plan to kill himself. Pt reports he was released from jail today, and "that my enemies overcame me, they froze me." Pt reports he feels he is "behind" in life that he was given a bad heart gene, has financial problems, housing issues, and medical problems. Pt reports while he was in jail he was not given his typical medication. He feels he needs to resume his medications, and get help setting up his basic needs. Pt reports he feels alone in the world and does not know how to get himself "on track." Pt reports he has goals and expectations and feels if he could get back on medications and possibly into rehab he could get healthy enough to reach his goals. Pt reports if he is not able to get help he will kill himself via cutting his throat or shooting himself. He endorses AVH with paranoia that people are out to get him and that the whole world knows about him. Pt reports frequent panic attacks and worry about how he will take care of himself and reach his goals. Pt reports he was previously dx with schizophrenia and bipolar.   Pt reports he feels some hope he can reach his goals with help but feels overwhelmed and unable to do it alone. He reports irritability, sadness, frequent worry and struggles to care for himself. Pt reports he often turns to using cocaine instead of caring for himself. He denies hx of abuse stating his life up to age 89 was great, but has been "Down hill since 58." Pt denies family hx of MH, SA and suicide attempts. He reports he has often sought help and then left because he wanted to smoke. He reports he will "use a patch or whatever it takes" pt pleaded for placement stating he is willing to go anywhere as he feels without proper help he will end his life.   Axis I:  295.50 Schizophrenia bipolar type  300.00 Unspecified Anxiety Disorder Axis II: Deferred Axis III:   Past Medical History  Diagnosis Date  . Diabetes mellitus without complication   . Hypertension   . Schizophrenia   . CHF (congestive heart failure)   . Neuropathy   . Polysubstance abuse   . Cocaine abuse   . Homelessness   . HIV (human immunodeficiency virus infection)   . Hepatitis C    Axis IV: economic problems, housing problems, occupational problems, other psychosocial or environmental problems, problems related to legal system/crime, problems with access to health care services and problems with primary support group Axis V: 21-30 behavior considerably influenced by delusions or hallucinations OR serious impairment in judgment, communication OR inability to function in almost all areas  Past Medical History:  Past Medical History  Diagnosis Date  . Diabetes mellitus without complication   . Hypertension   . Schizophrenia   . CHF (congestive heart failure)   . Neuropathy   . Polysubstance abuse   . Cocaine abuse   . Homelessness   . HIV (human immunodeficiency virus infection)   . Hepatitis C     History reviewed. No pertinent past surgical history.  Family History:  Family History  Problem Relation Age of Onset  . Hypertension Other   . Diabetes Other     Social History:  reports that he has been smoking Cigarettes.  He has a 20 pack-year smoking history. He  uses smokeless tobacco. He reports that he drinks alcohol. He reports that he uses illicit drugs ("Crack" cocaine, Cocaine, and Marijuana) about 7 times per week.  Additional Social History:  Alcohol / Drug Use Pain Medications: SEE PTA Prescriptions: SEE PTA Over the Counter: SEE PTA  History of alcohol / drug use?: Yes Longest period of sobriety (when/how long): reports unable to remain sober when not incarcerated Negative Consequences of Use: Financial, Legal, Personal relationships Withdrawal Symptoms:  (no reported at this time) Substance #1 Name of Substance 1: etoh 1 - Age of First Use: 8 1 -  Amount (size/oz): varies 1 - Frequency: "once in a blue moon" reports usually drinks at celebrations 1 - Duration: on and off for years  1 - Last Use / Amount: reports drank a fifth today  Substance #2 Name of Substance 2: cocaine 2 - Age of First Use: 20s 2 - Amount (size/oz): usually $100.00 2 - Frequency: daily when not incarcerated 2 - Duration: on and off for years  2 - Last Use / Amount: 08-09-14 $20.00 worth   CIWA: CIWA-Ar BP: 188/91 mmHg Pulse Rate: 91 COWS:    PATIENT STRENGTHS: (choose at least two) Communication skills Motivation for treatment/growth  Allergies:  Allergies  Allergen Reactions  . Haldol [Haloperidol] Other (See Comments)    Muscle spasms, loss of voluntary movement. However, pt has taken Thorazine on multiple occasions with no adverse effects.     Home Medications:  (Not in a hospital admission)  OB/GYN Status:  No LMP for male patient.  General Assessment Data [facility] Is this a Tele or Face-to-Face Assessment?: Tele Assessment Is this an Initial Assessment or a Re-assessment for this encounter?: Initial Assessment Living Arrangements: Alone (currently homeless) Can pt return to current living arrangement?: Yes Admission Status: Voluntary Is patient capable of signing voluntary admission?: Yes Transfer from: Other (Comment) (called EMS from gas station ) Referral Source: Self/Family/Friend     [facility] Living Arrangements: Alone (currently homeless) [facility]  Name of Therapist: none  Education Status Is patient currently in school?: No Current Grade: NA Highest grade of school patient has completed: 12 Name of school: NA Contact person: NA  Risk to self with the past 6 months Suicidal Ideation: Yes-Currently Present Suicidal Intent: Yes-Currently Present Is patient at risk for suicide?: Yes Suicidal Plan?: Yes-Currently Present Specify Current Suicidal Plan: slit wrist or  shoot self, reports threw away knife when called EMS Access to Means: Yes Specify Access to Suicidal Means: reports he has access to guns and knives What has been your use of drugs/alcohol within the last 12 months?: Pt reports he has been abusing cocaine for years, and uses etoh "once in a blue moon" Previous Attempts/Gestures: No How many times?: 0 Other Self Harm Risks: none Triggers for Past Attempts: None known Intentional Self Injurious Behavior: None Comment - Self Injurious Behavior: none Family Suicide History: No Recent stressful life event(s): Other (Comment) (released from jail, AVH, feels unable to care for himself.) Persecutory voices/beliefs?: Yes Depression: Yes Depression Symptoms: Despondent, Tearfulness, Loss of interest in usual pleasures, Feeling worthless/self pity, Feeling angry/irritable Substance abuse history and/or treatment for substance abuse?: Yes Suicide prevention information given to non-admitted patients: Yes  Risk to Others within the past 6 months Homicidal Ideation: No Thoughts of Harm to Others: No Comment - Thoughts of Harm to Others: made comments earlier in ED about thoughts of hurting others denies at this time Current Homicidal  Intent: No-Not Currently/Within Last 6 Months Current Homicidal Plan: No Describe Current Homicidal Plan: none Access to Homicidal Means: Yes Describe Access to Homicidal Means: reports has access to guns and knives Identified Victim: "enemies" per EDP History of harm to others?: No Assessment of Violence: None Noted Violent Behavior Description: none Does patient have access to weapons?: No Criminal Charges Pending?: No (on probation for one year) Does patient have a court date: No  Psychosis Hallucinations: Auditory, Visual Delusions: Persecutory  Mental Status Report Appear/Hygiene: In scrubs Eye Contact: Good Motor Activity: Unremarkable Speech: Pressured Level of Consciousness: Alert, Irritable Mood:  Depressed, Anxious Affect: Apprehensive, Depressed, Irritable Anxiety Level: Moderate Thought Processes: Coherent, Relevant Judgement: Partial Orientation: Person, Place, Time, Situation Obsessive Compulsive Thoughts/Behaviors: None  Cognitive Functioning Concentration: Normal Memory: Recent Intact, Remote Intact IQ: Average Insight: Fair Impulse Control: Poor Appetite: Good Weight Loss: 0 Weight Gain: 0 Sleep: No Change Total Hours of Sleep: 8 (while in jail ate and slept well ) Vegetative Symptoms: None  ADLScreening [facility] Assessment Services) Patient's cognitive ability adequate to safely complete daily activities?: Yes Patient able to express need for assistance with ADLs?: Yes Independently performs ADLs?: Yes (appropriate for developmental age)  Prior Inpatient Therapy Prior Inpatient Therapy: Yes Prior Therapy Dates: Multiple admissions Prior Therapy Facilty/Provider(s): [facility], [facility], [facility] Reason for Treatment: Psychosis, SI/HI  Prior Outpatient Therapy Prior Outpatient Therapy: Yes Prior Therapy Dates: Current Prior Therapy [facility]ilty[facility]Provider([facility]): [name] Reason for Treatment: medication management denies ACTT  ADL Screening (condition at time of admission) Patient's cognitive ability adequate to safely complete daily activities?: Yes Patient able to express need for assistance with ADLs?: Yes Independently performs ADLs?: Yes (appropriate for developmental age)       Abuse/Neglect Assessment (Assessment to be complete while patient is alone) Physical Abuse: Denies Verbal Abuse: Denies Sexual Abuse: Denies Exploitation of patient/patient's resources: Denies Self-Neglect: Denies Values / Beliefs Cultural Requests During Hospitalization: None Spiritual Requests During Hospitalization: None   Advance Directives (For Healthcare) Does patient have an advance directive?: No Would patient like information on creating an advanced directive?: No - patient declined  information    Additional Information 1:1 In Past 12 Months?: No CIRT Risk: No Elopement Risk: No Does patient have medical clearance?: Yes     Disposition:  Per [name], PA pt meets inpt criteria and can be accepted to [facility]. Per Bunnie Pion pt will be under the care of Dr. Elna Breslow and can arrive after 0800. EDP informed. Lequita Halt RN informed.   Pt denies hx of auditory hallucinations.  Disposition Initial Assessment Completed for this Encounter: Yes  Dailen Mcclish M 08/10/2014 6:18 AM

## 2014-08-11 DIAGNOSIS — F1490 Cocaine use, unspecified, uncomplicated: Secondary | ICD-10-CM

## 2014-08-11 DIAGNOSIS — F209 Schizophrenia, unspecified: Secondary | ICD-10-CM | POA: Diagnosis present

## 2014-08-11 LAB — GLUCOSE, CAPILLARY
GLUCOSE-CAPILLARY: 122 mg/dL — AB (ref 70–99)
Glucose-Capillary: 100 mg/dL — ABNORMAL HIGH (ref 70–99)

## 2014-08-11 MED ORDER — ARIPIPRAZOLE ER 400 MG IM SUSR
400.0000 mg | Freq: Once | INTRAMUSCULAR | Status: AC
  Administered 2014-08-11: 400 mg via INTRAMUSCULAR

## 2014-08-11 MED ORDER — ARIPIPRAZOLE 10 MG PO TABS
10.0000 mg | ORAL_TABLET | Freq: Every day | ORAL | Status: DC
  Administered 2014-08-12: 10 mg via ORAL
  Filled 2014-08-11 (×2): qty 1

## 2014-08-11 MED ORDER — NICOTINE POLACRILEX 2 MG MT GUM: 2.0000 mg | CHEWING_GUM | OROMUCOSAL | Status: DC | PRN

## 2014-08-11 NOTE — Progress Notes (Signed)
[facility]  08/11/2014 2:23 PM Sharolyn DouglasFredrick L Tozer  MRN:  [id number] Subjective:Patient states " I am still paranoid and depressed.' Objective: Patient seen and chart reviewed. Pt presented with paranoia as well as AH , PT with hx of schizophrenia as well as cocaine abuse , noncompliant on medications. Patient today with continued sx of paranoia as well as depression. Reports sleep as OK. Denies SI/HI. Discussed giving Abilify maintena. Pt is agreeable.   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 Schizophrenia [F20.9] 08/11/2014  . 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 Acute CHF (congestive heart failure) [I50.9] 07/29/2014  . Cocaine use disorder, severe, dependence [F14.20]   . Essential hypertension, benign [I10] 03/28/2013  . Diabetes mellitus [E11.9] 03/15/2013   Total Time spent with patient: 30 minutes   Past Medical History:  Past Medical History  Diagnosis Date  . Diabetes mellitus without complication   . Hypertension   . Schizophrenia   . CHF (congestive heart failure)   . Neuropathy   . Polysubstance abuse   . Cocaine abuse   . Homelessness   . HIV (human immunodeficiency virus infection)   . Hepatitis C    History reviewed. No pertinent past surgical history. Family History:  Family History  Problem Relation Age of Onset  . Hypertension Other   . Diabetes Other    Social History:  History  Alcohol Use  . Yes    Comment: Pt reports "on special occasions"     History  Drug Use  . 7.00 per week  . Special: "Crack" cocaine, Cocaine, Marijuana    Comment: crack, used  yesterday    History   Social History  . Marital Status: Divorced    Spouse Name: N/A  . Number of Children: N/A  . Years of Education: N/A   Social History Main Topics  . Smoking status: Current Every Day Smoker -- 1.00 packs/day for 20 years    Types: Cigarettes  . Smokeless tobacco: Current User  . Alcohol Use: Yes     Comment: Pt reports "on special occasions"  . Drug Use: 7.00 per week    Special: "Crack" cocaine, Cocaine, Marijuana     Comment: crack, used yesterday  . Sexual Activity: No   Other Topics Concern  . None   Social History Narrative   ** Merged History Encounter **       Additional History:    Sleep: Fair  Appetite:  Fair   Assessment:   Musculoskeletal: Strength & Muscle Tone: within normal limits Gait & Station: normal Patient leans: N/A   Psychiatric Specialty Exam: Physical Exam  Review of Systems  Psychiatric/Behavioral: Positive for depression. The patient is nervous/anxious.     Blood pressure 141/83, pulse 93, temperature 98.4 F (36.9 C), temperature source Oral, resp. rate 17, height 5\' 8"  (1.727 m), weight 74.844 kg (165 lb), SpO2 100 %.Body mass index is 25.09 kg/(m^2).  General Appearance: Casual  Eye Contact::  Fair  Speech:  Clear and Coherent  Volume:  Normal  Mood:  Anxious and Depressed  Affect:  Labile  Thought Process:  Logical  Orientation:  Full (Time, Place, and Person)  Thought Content:  Delusions and Paranoid Ideation  Suicidal Thoughts:  No  Homicidal Thoughts:  No  Memory:  Immediate;   Fair Recent;   Fair Remote;   Fair  Judgement:  Impaired  Insight:  Shallow  Psychomotor Activity:  Restlessness  Concentration:  Fair  Recall:  [employer] of Knowledge:Fair  Language: Fair  Akathisia:  No  Handed:  Right  AIMS (if indicated):     Assets:  Communication Skills  ADL's:  Intact  Cognition: WNL  Sleep:  Number of Hours: 4     Current Medications: Current Facility-Administered Medications   Medication Dose Route Frequency Provider Last Rate Last Dose  . acetaminophen (TYLENOL) tablet 650 mg  650 mg Oral Q6H PRN Shuvon Rankin, NP   650 mg at 08/11/14 1253  . alum & mag hydroxide-simeth (MAALOX/MYLANTA) 200-200-20 MG/5ML suspension 30 mL  30 mL Oral Q4H PRN Shuvon Rankin, NP      . [START ON 08/12/2014] ARIPiprazole (ABILIFY) tablet 10 mg  10 mg Oral Daily Audrey Eller, MD      . ARIPiprazole SUSR 400 mg  400 mg Intramuscular Once Jomarie Longs, MD      . aspirin EC tablet 81 mg  81 mg Oral Daily Shuvon Rankin, NP   81 mg at 08/11/14 0826  . benazepril (LOTENSIN) tablet 5 mg  5 mg Oral Daily Shuvon Rankin, NP   5 mg at 08/11/14 0826  . benztropine (COGENTIN) tablet 2 mg  2 mg Oral BID Shuvon Rankin, NP   2 mg at 08/11/14 0826  . cloNIDine (CATAPRES) tablet 0.1 mg  0.1 mg Oral BID Shuvon Rankin, NP   0.1 mg at 08/11/14 0826  . furosemide (LASIX) tablet 20 mg  20 mg Oral Daily Shuvon Rankin, NP   20 mg at 08/11/14 0826  . magnesium hydroxide (MILK OF MAGNESIA) suspension 30 mL  30 mL Oral Daily PRN Shuvon Rankin, NP      . metFORMIN (GLUCOPHAGE) tablet 500 mg  500 mg Oral Q breakfast Shuvon Rankin, NP   500 mg at 08/11/14 0826  . nicotine (NICODERM CQ - dosed in mg/24 hours) patch 21 mg  21 mg Transdermal Daily Embry Manrique, MD   21 mg at 08/11/14 0830  . potassium chloride SA (K-DUR,KLOR-CON) CR tablet 20 mEq  20 mEq Oral BID Jomarie Longs, MD   20 mEq at 08/11/14 0830  . tamsulosin (FLOMAX) capsule 0.4 mg  0.4 mg Oral QPC supper Shuvon Rankin, NP   0.4 mg at 08/10/14 1712  . traZODone (DESYREL) tablet 50 mg  50 mg Oral QHS PRN Shuvon Rankin, NP   50 mg at 08/10/14 2118    Lab Results:  Results for orders placed or performed during the hospital encounter of 08/10/14 (from the past 48 hour(s))  Glucose, capillary     Status: Abnormal   Collection Time: 08/10/14  5:00 PM  Result Value Ref Range   Glucose-Capillary 147 (H) 70 - 99 mg/dL  Glucose, capillary     Status: Abnormal    Collection Time: 08/11/14  6:11 AM  Result Value Ref Range   Glucose-Capillary 122 (H) 70 - 99 mg/dL    Physical Findings: AIMS:  , ,  ,  ,    CIWA:    COWS:     Assessment:Patient is a 16 y old AAM with hx of schizophrenia as well as cocaine abuse, presented with disorganized behavior as well as paranoia and cocaine abuse.Pt restarted on Abilify .   Treatment Plan Summary: Daily contact with patient  to assess and evaluate symptoms and progress in treatment and Medication management Will increase Abilify to 10 mg po daily. Will give Abilify Maintena 400 mg IM x1 dose and repeat every 28 days. Will continue Trazodone 50 mg po qhs for sleep. Will continue Kdur - pt with hypokalemia- will repeat BMP tomorrow. Pt with DM ,on Metformin - will order CBG BID , reviewed Hba1c in EHR. CSW will work on disposition.  Medical Decision Making:  Review of Psycho-Social Stressors (1), Review or order clinical lab tests (1), Review or order medicine tests (1), Review of Medication Regimen & Side Effects (2) and Review of New Medication or Change in Dosage (2)     Camelle Henkels MD 08/11/2014, 2:23 PM

## 2014-08-11 NOTE — [facility]HH Group Notes (Signed)
The focus of this group is to educate the patient on the purpose and policies of crisis stabilization and provide a format to answer questions about their admission.  The group details unit policies and expectations of patients while admitted. Patient attended this group. 

## 2014-08-11 NOTE — Progress Notes (Signed)
B[facility] Group Notes:  (Nursing/MHT/Case Management/Adjunct)  Date:  08/11/2014  Time:  9:19 PM  Type of Therapy:  Psychoeducational Skills  Participation Level:  Active  Participation Quality:  Appropriate  Affect:  Blunted  Cognitive:  Appropriate  Insight:  Appropriate  Engagement in Group:  Developing/Improving  Modes of Intervention:  Education  Summary of Progress/Problems: The patient expressed in group that he had a good day overall. He mentioned that he received his shot today and that he is working on his housing situation. The patient is unclear about where he will be discharge to, but is interested in calling the [facility] and other [facility]. In terms of the theme for the day, his lifestyle change will include pursuing a relationship with a woman and get married.   Hazle CocaGOODMAN, Jayliani Wanner S 08/11/2014, 9:19 PM

## 2014-08-11 NOTE — Progress Notes (Signed)
D: Patient denies SHI and A/V hallucinations; patient reports sleep is good; reports appetite is fair; reports energy level is normal ; reports ability to concentrate is good; rates depression as 7/10; rates hopelessness 7/10; rates anxiety as 7/10; patient reporting some on and off thoughts of SI; patient stated " I cant do the same thing anymore"  A: Monitored q 15 minutes; patient encouraged to attend groups; patient educated about medications; patient given medications per physician orders; patient encouraged to express feelings and/or concerns  R: Patient is cooperative; patient is animated; patient is engaging in the groups; patient's interaction with staff and peers is assertive; patient was able to set goal to talk with staff 1:1 when having feelings of SI; patient is taking medications as prescribed and tolerating medications
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
## 2014-08-11 NOTE — BHH Counselor (Signed)
Adult Comprehensive Assessment  Patient ID: [name], male DOB: Oct 27, 1960, 54 y.o. MRN: [id number]  Information Source: Information source: Patient  Current Stressors:  Educational / Learning stressors: Pt. denies Employment / Job issues: Pt denies Family Relationships: Pt denies Surveyor, quantity / Lack of resources (include bankruptcy): Pt denies Housing / Lack of housing:Says he cannot return to home because there is no heat.  Blames this on his niece. Physical health (include injuries & life threatening diseases): Pt denies Social relationships: pt denies Substance abuse: Crack cocaine Bereavement / Loss: Pt denies   Living/Environment/Situation:  Living Arrangements: Homeless  Living conditions (as described by patient or guardian): [place] area, dark at night and pt reports he is scared of dogs. How long has patient lived in current situation?: 25 years  What is atmosphere in current home: Supportive, Dangerous  Family History:  Marital status: Single Does patient have children?: No  Childhood History:  By whom was/is the patient raised?: Other (Comment) ("I raised myself") Description of patient's relationship with caregiver when they were a child: Pt did not want to share Patient's description of current relationship with people who raised him/her: Pt did not want to share; however, reports they are living Does patient have siblings?: No Did patient suffer any verbal/emotional/physical/sexual abuse as a child?: No Did patient suffer from severe childhood neglect?: No Has patient ever been sexually abused/assaulted/raped as an adolescent or adult?: No Was the patient ever a victim of a crime or a disaster?: No Witnessed domestic violence?: No Has patient been effected by domestic violence as an adult?: No  Education:  Highest grade of school patient has completed: 12th Currently a student?: No Learning disability?: No  Employment/Work Situation:   Employment situation: On disability Why is patient on disability: Mental Health  How long has patient been on disability: Since 1986 Patient's job has been impacted by current illness: No What is the longest time patient has a held a job?: 3 years 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 Has patient ever been in the [employer]?: Yes (Describe in comment) Therapist, occupational (served for 2 years)) Has patient ever served in combat?: Yes 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 [place]  Financial Resources:  Financial resources: [employer], [employer], [employer], Medicare Does patient have a Lawyer or guardian?: No  Alcohol/Substance Abuse:  What has been your use of drugs/alcohol within the last 12 months?: Crack cocaine, PCP, ATC, all kinds of alcohol If attempted suicide, did drugs/alcohol play a role in this?: No Alcohol/Substance Abuse Treatment Hx: Past Tx, Inpatient, Past Tx, Outpatient, Attends AA/NA, Past detox Has alcohol/substance abuse ever caused legal problems?: Yes  Social Support System:  Patient's Community Support System: Poor Describe Community Support System: ACT team, but not neices  "It's all about the money for them" Type of faith/religion: "Maybe Muslim"  How does patient's faith help to cope with current illness?: Not sure  Leisure/Recreation:  Leisure and Hobbies: I like to play basketball, swim  Strengths/Needs:  What things does the patient do well?: "Give people good advice, knowledge, wisdom and understanding." In what areas does patient struggle / problems for patient: "I don't struggle anymore"  Discharge Plan:  Does patient have access to transportation?: No Plan for no access to transportation at discharge: Needs bus pass Will patient be returning to same living situation after discharge?: Yes Currently receiving community mental health services: Yes (From  Whom) [name]) If no, would patient  like referral for services when discharged?: No Does patient have financial barriers related to discharge medications?: No  Summary/Recommendations:  Pt is a 54 Yo African American male [facility] with [facility] and substance abuse. He was recently released from jail. He is currently homeless. Pt wanted to get through questions quickly and his main concern was his lack of housing. He receives SSDI and Medicaid. He does not have money for housing until the beginning of March. Pt plans to go to a shelter and [facility]. [facility]. Pt reports using Cocaine and would like a referral for substance abuse treatment, if it means he would be able to come in from the cold until he gets his check. Recommendations include; crisis stabilization, medication management, therapeutic milieu and encourage group attendance and participation.

## 2014-08-11 NOTE — B[facility] Group Notes (Signed)
BHH LCSW Group Therapy  08/11/2014 12:57 PM   Type of Therapy:  Group Therapy  Participation Level:  Active  Participation Quality:  Attentive  Affect:  Appropriate  Cognitive:  Appropriate  Insight:  Improving  Engagement in Therapy:  Engaged  Modes of Intervention:  Clarification, Education, Exploration and Socialization  Summary of Progress/Problems: Today's group focused on relapse prevention.  We defined the term, and then brainstormed on ways to prevent relapse.  Stayed briefly.  Was in a bad mood before he came, but agreed to try it.  Left after 10 minutes.  Daryel Geraldorth, Twylla Arceneaux B 08/11/2014 , 12:57 PM
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
## 2014-08-12 ENCOUNTER — Encounter (HOSPITAL_COMMUNITY): Payer: Self-pay | Admitting: Psychiatry

## 2014-08-12 LAB — LIPID PANEL
CHOL/HDL RATIO: 2.9 ratio
CHOLESTEROL: 159 mg/dL (ref 0–200)
HDL: 55 mg/dL (ref >39)
LDL Cholesterol: 87 mg/dL (ref 0–99)
Triglycerides: 83 mg/dL (ref <150)
VLDL: 17 mg/dL (ref 0–40)

## 2014-08-12 LAB — BASIC METABOLIC PANEL
ANION GAP: 5 (ref 5–15)
BUN: 23 mg/dL (ref 6–23)
CHLORIDE: 102 mmol/L (ref 96–112)
CO2: 26 mmol/L (ref 19–32)
Calcium: 9 mg/dL (ref 8.4–10.5)
Creatinine, Ser: 1 mg/dL (ref 0.50–1.35)
GFR, EST NON AFRICAN AMERICAN: 84 mL/min — AB (ref >90)
Glucose, Bld: 130 mg/dL — ABNORMAL HIGH (ref 70–99)
POTASSIUM: 3.8 mmol/L (ref 3.5–5.1)
SODIUM: 133 mmol/L — AB (ref 135–145)

## 2014-08-12 LAB — GLUCOSE, CAPILLARY: GLUCOSE-CAPILLARY: 128 mg/dL — AB (ref 70–99)

## 2014-08-12 MED ORDER — TRAZODONE HCL 50 MG PO TABS: 50.0000 mg | ORAL_TABLET | Freq: Every evening | ORAL | Status: DC | PRN

## 2014-08-12 MED ORDER — ARIPIPRAZOLE ER 400 MG IM SUSR: 400.0000 mg | INTRAMUSCULAR | Status: DC

## 2014-08-12 MED ORDER — CLONIDINE HCL 0.1 MG PO TABS: 0.1000 mg | ORAL_TABLET | Freq: Two times a day (BID) | ORAL | Status: DC

## 2014-08-12 MED ORDER — BENZTROPINE MESYLATE 2 MG PO TABS: 2.0000 mg | ORAL_TABLET | Freq: Two times a day (BID) | ORAL | Status: DC

## 2014-08-12 MED ORDER — TAMSULOSIN HCL 0.4 MG PO CAPS: 0.4000 mg | ORAL_CAPSULE | Freq: Every day | ORAL | Status: DC

## 2014-08-12 MED ORDER — BENAZEPRIL HCL 5 MG PO TABS: 5.0000 mg | ORAL_TABLET | Freq: Every day | ORAL | Status: DC

## 2014-08-12 MED ORDER — METFORMIN HCL 500 MG PO TABS: 500.0000 mg | ORAL_TABLET | Freq: Every day | ORAL | Status: DC

## 2014-08-12 MED ORDER — FUROSEMIDE 20 MG PO TABS: 20.0000 mg | ORAL_TABLET | Freq: Every day | ORAL | Status: DC

## 2014-08-12 MED ORDER — ARIPIPRAZOLE 10 MG PO TABS: 10.0000 mg | ORAL_TABLET | Freq: Every day | ORAL | Status: DC

## 2014-08-12 MED ORDER — ASPIRIN 81 MG PO TBEC: 81.0000 mg | DELAYED_RELEASE_TABLET | Freq: Every day | ORAL | Status: DC

## 2014-08-12 NOTE — Progress Notes (Signed)
Patient discharged per physician order; patient denies SI/HI and A/V hallucinations; patient received samples, prescriptions, and copy of AVS after it was reviewed; patient had no other questions or concerns at this time; patient left the unit ambulatory; patient reported that he did not receive his yellow [employer] jacket back and that it was in the locker; Taylor BandaJohn M. Was called because he was present during the admission; both patient rooms he occupied was checked, the laundry room and the search room storage area was checked by more than one staff member; I informed the patient I would notify the [facility] Tomi LikensEric K. RN but the patient refused to wait; Tomi LikensEric K. RN was notified of the incident and no other instructions given; I was able to obtain his number in case it is found. [phone]
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
## 2014-08-12 NOTE — Progress Notes (Signed)
  [facility] Discharge [facility] :  Will you be returning to the same living situation after discharge:  No. At discharge, do you have transportation home?: Yes,  bus pass Do you have the ability to pay for your medications: Yes,  MCD  Release of information consent forms completed and in the chart;  Patient's signature needed at discharge.  Patient to Follow up at: Follow-up Information    Follow up with [name] On 09/14/2014.   Why:  Wednesday at 2:00 with Dr Doristine ChurchAlamu   Contact information:   [address]  [place]  [phone]      Patient denies SI/HI: Yes,  yes    Safety Planning and Suicide Prevention discussed: Yes,  yes  Have you used any form of tobacco in the last 30 days? (Cigarettes, Smokeless Tobacco, Cigars, and/or Pipes): Yes  Has patient been referred to the Quitline?: Yes, faxed on 08/12/14  Ida Rogueorth, Nicola Heinemann B 08/12/2014, 9:11 AM

## 2014-08-12 NOTE — Progress Notes (Signed)
D: Pt was appropriate in affect and pleasant in mood. Pt actively participated within the milieu. Pt denied any SI/HI/AVH. Pt is [facility]. Pt is cooperative with his current plan of care. Pt observed interacting appropriately with staff and the other patients.  A: Writer administered scheduled and prn medications to pt, per MD orders. Continued support and availability as needed was extended to this pt. Staff continue to monitor pt with q5415min checks.  R: No adverse drug reactions noted. Pt receptive to treatment. Pt remains safe at this time.

## 2014-08-12 NOTE — Discharge Summary (Signed)
Physician Discharge Summary Note  Patient:  [name] is an 54 y.o., male MRN:  [id number] DOB:  Aug 27, 1960 Patient phone:  [phone] (home)  Patient address:   [address] [place] [address],  Total Time spent with patient: Greater than 30 minutes  Date of Admission:  08/10/2014  Date of Discharge: 08-12-14  Reason for Admission: Mood stabilization treatment  Principal Problem: Schizophrenia Discharge Diagnoses: Patient Active Problem List   Diagnosis Date Noted  . Schizophrenia [F20.9] 08/11/2014  . 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 Acute CHF (congestive heart failure) [I50.9] 07/29/2014  . Cocaine use disorder, severe, dependence [F14.20]   . Essential hypertension, benign [I10] 03/28/2013  . Diabetes mellitus [E11.9] 03/15/2013   Musculoskeletal: Strength & Muscle Tone: within normal limits Gait & Station: normal Patient leans: N/A  Psychiatric Specialty Exam: Physical Exam  Psychiatric: His speech is normal and behavior is normal. Thought content normal. His mood appears not anxious. His affect is not angry, not blunt, not labile and not inappropriate. Cognition and memory are normal. He does not exhibit a depressed mood.    Review of Systems  Constitutional: Negative.   HENT: Negative.   Eyes: Negative.   Respiratory: Negative.   Cardiovascular: Negative.   Gastrointestinal: Negative.   Genitourinary: Negative.   Musculoskeletal: Negative.   Skin: Negative.   Neurological: Negative.   Endo/Heme/Allergies: Negative.   Psychiatric/Behavioral: Positive for depression (Stable) and substance abuse (Hx Cocaine dependence). Negative for suicidal ideas and hallucinations. The patient has insomnia (Stable). The patient is not nervous/anxious.     Blood pressure 157/91, pulse 91, temperature 97.8 F (36.6 C), temperature source Oral, resp. rate 16, height '5\' 8"'  (1.727 m),  weight 74.844 kg (165 lb), SpO2 100 %.Body mass index is 25.09 kg/(m^2).  See Md's SRA   Past Medical History:  Past Medical History  Diagnosis Date  . Diabetes mellitus without complication   . Hypertension   . Schizophrenia   . CHF (congestive heart failure)   . Neuropathy   . Polysubstance abuse   . Cocaine abuse   . Homelessness   . HIV (human immunodeficiency virus infection)   . Hepatitis C    History reviewed. No pertinent past surgical history. Family History:  Family History  Problem Relation Age of Onset  . Hypertension Other   . Diabetes Other    Social History:  History  Alcohol Use  . Yes    Comment: Pt reports "on special occasions"     History  Drug Use  . 7.00 per week  . Special: "Crack" cocaine, Cocaine, Marijuana    Comment: crack, used yesterday    History   Social History  . Marital Status: Divorced    Spouse Name: N/A  . Number of Children: N/A  . Years of Education: N/A   Social History Main Topics  . Smoking status: Current Every Day Smoker -- 1.00 packs/day for 20 years    Types: Cigarettes  . Smokeless tobacco: Current User  . Alcohol Use: Yes     Comment: Pt reports "on special occasions"  . Drug Use: 7.00 per week    Special: "Crack" cocaine, Cocaine, Marijuana     Comment: crack, used yesterday  . Sexual Activity: No   Other Topics Concern  . None   Social History Narrative   ** Merged History Encounter **       Risk to Self: Is patient at risk for suicide?:  No  Risk to Others: No  Prior Inpatient Therapy: Yes  Prior Outpatient Therapy: Yes  Level of Care:  OP  Hospital Course:  Patient states "I need to get myself together and get off of this crack cocaine. I been feeling depressed and having suicidal thoughts cause I out in the cold freezing my ass off and I been pissing and shitting all over myself. I just got out of the hospital and before that I was in jail." Patient states that he wants outpatient services  and then he wants long term rehab for substance abuse".  After admission assessment/evaluation, [name] was started om Abilify 10 mg tablets daily for mood control. This was initiated after the decision was made to resume patient on Abilify SUSR, a long acting injectable on a monthly basis. He will need to continue on the oral form of abilify till the injectable kicks in. Rikki is homeless, as a result has difficulty with medication adherence. He was also medicated & discharged on Benztropine 2 mg tablet twice daily for prevention of EPS & Trazodone 50 mg daily for insomnia. He was also resumed on all his pertinent home medications for his other medical issues that he presented. He tolerated his treatment regimen without any adverse effects and or reactions reported.  Besides the medication managements, Gentry was also enrolled & participated in the group counseling sessions being offered and held on this unit. He learned coping skills that should help him cope better after discharge to maintain mood stability. Dima's symptoms responded well to his treatment regimen. It appears the combination therapy of medication management and routine counseling helped improve his symptoms. He is currently being discharged to continue further psychiatric treatment/counseling at the [facility] here in [place], [place] with Dr. Fransisco Hertz. He is provided with all the necessary information required to make this appointment without problems.  Upon discharge, he adamantly denies any SIHI, AVH, delusional thoughts & or paranoia. He received from the [facility], a 4 days worth, supply samples of his [facility] discharge medications. He left [facility] with all personal belongings in no distress. Transportation per city buss. Bus pass provided by [facility].  Consults:  psychiatry  Significant Diagnostic Studies:  labs: CBC with diff, CMP, UDS, toxicology tests, U/A, HGBA1C, results reviewed, no changes  Discharge Vitals:   Blood  pressure 157/91, pulse 91, temperature 97.8 F (36.6 C), temperature source Oral, resp. rate 16, height '5\' 8"'  (1.727 m), weight 74.844 kg (165 lb), SpO2 100 %. Body mass index is 25.09 kg/(m^2). Lab Results:   Results for orders placed or performed during the hospital encounter of 08/10/14 (from the past 72 hour(s))  Glucose, capillary     Status: Abnormal   Collection Time: 08/10/14  5:00 PM  Result Value Ref Range   Glucose-Capillary 147 (H) 70 - 99 mg/dL  Glucose, capillary     Status: Abnormal   Collection Time: 08/11/14  6:11 AM  Result Value Ref Range   Glucose-Capillary 122 (H) 70 - 99 mg/dL  Glucose, capillary     Status: Abnormal   Collection Time: 08/11/14  5:03 PM  Result Value Ref Range   Glucose-Capillary 100 (H) 70 - 99 mg/dL  Glucose, capillary     Status: Abnormal   Collection Time: 08/12/14  5:56 AM  Result Value Ref Range   Glucose-Capillary 128 (H) 70 - 99 mg/dL  Lipid panel     Status: None   Collection Time: 08/12/14  6:38 AM  Result Value Ref Range  Cholesterol 159 0 - 200 mg/dL   Triglycerides 83 <150 mg/dL   HDL 55 >39 mg/dL   Total CHOL/HDL Ratio 2.9 RATIO   VLDL 17 0 - 40 mg/dL   LDL Cholesterol 87 0 - 99 mg/dL    Comment:        Total Cholesterol/HDL:CHD Risk Coronary Heart Disease Risk Table                     Men   Women  1/2 Average Risk   3.4   3.3  Average Risk       5.0   4.4  2 X Average Risk   9.6   7.1  3 X Average Risk  23.4   11.0        Use the calculated Patient Ratio above and the CHD Risk Table to determine the patient's CHD Risk.        ATP III CLASSIFICATION (LDL):  <100     mg/dL   Optimal  100-129  mg/dL   Near or Above                    Optimal  130-159  mg/dL   Borderline  160-189  mg/dL   High  >190     mg/dL   Very High Performed at [facility] metabolic panel     Status: Abnormal   Collection Time: 08/12/14  6:38 AM  Result Value Ref Range   Sodium 133 (L) 135 - 145 mmol/L   Potassium 3.8 3.5  - 5.1 mmol/L   Chloride 102 96 - 112 mmol/L   CO2 26 19 - 32 mmol/L   Glucose, Bld 130 (H) 70 - 99 mg/dL   BUN 23 6 - 23 mg/dL   Creatinine, Ser 1.00 0.50 - 1.35 mg/dL   Calcium 9.0 8.4 - 10.5 mg/dL   GFR calc non Af Amer 84 (L) >90 mL/min   GFR calc Af Amer >90 >90 mL/min    Comment: (NOTE) The eGFR has been calculated using the CKD EPI equation. This calculation has not been validated in all clinical situations. eGFR's persistently <90 mL/min signify possible Chronic Kidney Disease.    Anion gap 5 5 - 15    Comment: Performed at [facility]    Physical Findings: AIMS:  , ,  ,  ,    CIWA:    COWS:     See Psychiatric Specialty Exam and Suicide Risk Assessment completed by Attending Physician prior to discharge.  Discharge destination:  Home  Is patient on multiple antipsychotic therapies at discharge:  No   Has Patient had three or more failed trials of antipsychotic monotherapy by history:  No  Recommended Plan for Multiple Antipsychotic Therapies: NA    Medication List    STOP taking these medications        fluPHENAZine decanoate 25 MG/ML injection  Commonly known as:  PROLIXIN      TAKE these medications      Indication   ARIPiprazole 10 MG tablet  Commonly known as:  ABILIFY  Take 1 tablet (10 mg total) by mouth daily. For mood control   Indication:  Mood control     ARIPiprazole 400 MG Susr  Inject 400 mg into the muscle every 30 (thirty) days. (Due to be given on 09-12-14 @ 08:00 am):For mood control  Start taking on:  09/12/2014   Indication:  Mood control  aspirin 81 MG EC tablet  Take 1 tablet (81 mg total) by mouth daily. For heart health   Indication:  Heart health     benazepril 5 MG tablet  Commonly known as:  LOTENSIN  Take 1 tablet (5 mg total) by mouth daily. For high blood pressure   Indication:  High Blood Pressure     benztropine 2 MG tablet  Commonly known as:  COGENTIN  Take 1 tablet (2 mg total) by mouth 2  (two) times daily. For prevention of drug induced tremors   Indication:  Extrapyramidal Reaction caused by Medications     cloNIDine 0.1 MG tablet  Commonly known as:  CATAPRES  Take 1 tablet (0.1 mg total) by mouth 2 (two) times daily. For high blood pressure   Indication:  High Blood Pressure     furosemide 20 MG tablet  Commonly known as:  LASIX  Take 1 tablet (20 mg total) by mouth daily. For swellings/high blood pressure   Indication:  Edema, High Blood Pressure     metFORMIN 500 MG tablet  Commonly known as:  GLUCOPHAGE  Take 1 tablet (500 mg total) by mouth daily with breakfast. For high blood sugar control   Indication:  Type 2 Diabetes     tamsulosin 0.4 MG Caps capsule  Commonly known as:  FLOMAX  Take 1 capsule (0.4 mg total) by mouth daily after supper. For prostate health   Indication:  Enlarged Prostate     traZODone 50 MG tablet  Commonly known as:  DESYREL  Take 1 tablet (50 mg total) by mouth at bedtime as needed for sleep.   Indication:  Trouble Sleeping       Follow-up Information    Follow up with [name] On 09/14/2014.   Why:  Wednesday at 2:00 with Dr Dwyane Luo information:   [place] [phone]     Follow-up recommendations: Activity:  As tolerated Diet: As recommended by your primary care doctor. Keep all scheduled follow-up appointments as recommended.   Comments:   Take all your medications as prescribed by your mental healthcare provider. Report any adverse effects and or reactions from your medicines to your outpatient provider promptly. Patient is instructed and cautioned to not engage in alcohol and or illegal drug use while on prescription medicines. In the event of worsening symptoms, patient is instructed to call the crisis hotline, 911 and or go to the nearest ED for appropriate evaluation and treatment of symptoms. Follow-up with your primary care provider for your other medical issues, concerns and or health  care needs.   Total Discharge Time: Greater than 30 minutes  Signed: Encarnacion Slates, PMHN, FNP-Bc 08/12/2014, 3:28 PM

## 2014-08-12 NOTE — [facility] Suicide Risk Assessment (Signed)
[facility]   Demographic Factors:  Male  Total Time spent with patient: 30 minutes  Musculoskeletal: Strength & Muscle Tone: within normal limits Gait & Station: normal Patient leans: N/A  Psychiatric Specialty Exam: Physical Exam  Review of Systems  Constitutional: Negative.   HENT: Negative.   Eyes: Negative.   Respiratory: Negative.   Cardiovascular: Negative.   Gastrointestinal: Negative.   Genitourinary: Negative.   Musculoskeletal: Negative.   Skin: Negative.   Neurological: Negative.   Psychiatric/Behavioral: Positive for substance abuse. Negative for depression, suicidal ideas and hallucinations. The patient is not nervous/anxious.     Blood pressure 157/91, pulse 91, temperature 97.8 F (36.6 C), temperature source Oral, resp. rate 16, height 5\' 8"  (1.727 m), weight 74.844 kg (165 lb), SpO2 100 %.Body mass index is 25.09 kg/(m^2).  General Appearance: Casual  Eye Contact::  Fair  Speech:  Clear and Coherent409  Volume:  Normal  Mood:  Euthymic  Affect:  Congruent  Thought Process:  Coherent  Orientation:  Full (Time, Place, and Person)  Thought Content:  WDL  Suicidal Thoughts:  No  Homicidal Thoughts:  No  Memory:  Immediate;   Fair Recent;   Fair Remote;   Fair  Judgement:  Fair  Insight:  Shallow  Psychomotor Activity:  Normal  Concentration:  Fair  Recall:  FiservFair  Fund of Knowledge:Fair  Language: Fair  Akathisia:  No  Handed:  Right  AIMS (if indicated):     Assets:  Communication Skills Desire for Improvement  Sleep:  Number of Hours: 2.5  Cognition: WNL  ADL's:  Intact   Have you used any form of tobacco in the last 30 days? (Cigarettes, Smokeless Tobacco, Cigars, and/or Pipes): Yes  Has this patient used any form of tobacco in the last 30 days? (Cigarettes, Smokeless Tobacco, Cigars, and/or Pipes) Yes, Prescription not provided because: pt declines  Mental Status Per Nursing Assessment::   On Admission:      Current Mental Status by Physician: pt denies SI/HI/AH/VH  Loss Factors: Homeless,substance abuse  Historical Factors: Impulsivity  Risk Reduction Factors:   NA  Continued Clinical Symptoms:  Alcohol/Substance Abuse/Dependencies Previous Psychiatric Diagnoses and Treatments Medical Diagnoses and Treatments/Surgeries  Cognitive Features That Contribute To Risk:  Polarized thinking    Suicide Risk:  Minimal: No identifiable suicidal ideation.  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 Schizophrenia [F20.9] 08/11/2014  . 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 Acute CHF (congestive heart failure) [I50.9] 07/29/2014  . Cocaine use disorder, severe, dependence [F14.20]   . Essential hypertension, benign [I10] 03/28/2013  . Diabetes mellitus [E11.9] 03/15/2013    Follow-up Information    Follow up with [name] On 09/14/2014.   Why:  Wednesday at 2:00 with Dr Doristine ChurchAlamu   Contact information:   [address]  [place]  [phone]      Plan Of Care/Follow-up recommendations: Patient received Abilify Maintena 400 mg IM on 08/11/14. Next dose in 30 days.AIMS - 0 (  08/12/14) Activity:  no restrictions Diet:  heart healthy Tests:  as needed Other:  follow up with after care  Is patient on multiple antipsychotic therapies at discharge:  No   Has Patient had  three or more failed trials of antipsychotic monotherapy by history:  No  Recommended Plan for Multiple Antipsychotic Therapies: NA    Darell Saputo,SarammaMD 08/12/2014, 9:49 AM

## 2014-08-17 NOTE — Progress Notes (Signed)
Patient Discharge Instructions:  After Visit Summary (AVS):   Faxed to:  08/17/14 Discharge Summary Note:   Faxed to:  08/17/14 Psychiatric Admission Assessment Note:   Faxed to:  08/17/14 Suicide Risk Assessment - Discharge Assessment:   Faxed to:  08/17/14 Faxed/Sent to the Next Level Care provider:  08/17/14 [facility] [phone]  Jerelene ReddenSheena E Atwood, 08/17/2014, 3:07 PM

## 2014-08-26 ENCOUNTER — Emergency Department (HOSPITAL_COMMUNITY)
Admission: EM | Admit: 2014-08-26 | Discharge: 2014-08-26 | Disposition: A | Discharge status: Home / Self Care | Payer: Medicare Other | Attending: Emergency Medicine | Admitting: Emergency Medicine

## 2014-08-26 ENCOUNTER — Encounter (HOSPITAL_COMMUNITY): Payer: Self-pay | Admitting: Emergency Medicine

## 2014-08-26 DIAGNOSIS — Z21 Asymptomatic human immunodeficiency virus [HIV] infection status: Secondary | ICD-10-CM | POA: Insufficient documentation

## 2014-08-26 DIAGNOSIS — I509 Heart failure, unspecified: Secondary | ICD-10-CM | POA: Insufficient documentation

## 2014-08-26 DIAGNOSIS — F209 Schizophrenia, unspecified: Secondary | ICD-10-CM | POA: Diagnosis present

## 2014-08-26 DIAGNOSIS — F1420 Cocaine dependence, uncomplicated: Secondary | ICD-10-CM | POA: Diagnosis present

## 2014-08-26 DIAGNOSIS — Z7982 Long term (current) use of aspirin: Secondary | ICD-10-CM | POA: Insufficient documentation

## 2014-08-26 DIAGNOSIS — Z8619 Personal history of other infectious and parasitic diseases: Secondary | ICD-10-CM | POA: Insufficient documentation

## 2014-08-26 DIAGNOSIS — I10 Essential (primary) hypertension: Secondary | ICD-10-CM | POA: Insufficient documentation

## 2014-08-26 DIAGNOSIS — F200 Paranoid schizophrenia: Secondary | ICD-10-CM

## 2014-08-26 DIAGNOSIS — Z720 Tobacco use: Secondary | ICD-10-CM | POA: Insufficient documentation

## 2014-08-26 DIAGNOSIS — E119 Type 2 diabetes mellitus without complications: Secondary | ICD-10-CM | POA: Insufficient documentation

## 2014-08-26 DIAGNOSIS — F1994 Other psychoactive substance use, unspecified with psychoactive substance-induced mood disorder: Secondary | ICD-10-CM | POA: Diagnosis present

## 2014-08-26 DIAGNOSIS — Z79899 Other long term (current) drug therapy: Secondary | ICD-10-CM | POA: Insufficient documentation

## 2014-08-26 DIAGNOSIS — G8929 Other chronic pain: Secondary | ICD-10-CM | POA: Insufficient documentation

## 2014-08-26 DIAGNOSIS — Z590 Homelessness: Secondary | ICD-10-CM | POA: Insufficient documentation

## 2014-08-26 DIAGNOSIS — F1424 Cocaine dependence with cocaine-induced mood disorder: Secondary | ICD-10-CM | POA: Insufficient documentation

## 2014-08-26 HISTORY — DX: Pain in unspecified foot: M79.673

## 2014-08-26 HISTORY — DX: Other chronic pain: G89.29

## 2014-08-26 LAB — COMPREHENSIVE METABOLIC PANEL
ALBUMIN: 4.3 g/dL (ref 3.5–5.2)
ALT: 13 U/L (ref 0–53)
AST: 26 U/L (ref 0–37)
Alkaline Phosphatase: 103 U/L (ref 39–117)
Anion gap: 6 (ref 5–15)
BUN: 10 mg/dL (ref 6–23)
CO2: 30 mmol/L (ref 19–32)
Calcium: 9.6 mg/dL (ref 8.4–10.5)
Chloride: 102 mmol/L (ref 96–112)
Creatinine, Ser: 0.8 mg/dL (ref 0.50–1.35)
GFR calc non Af Amer: >90 mL/min (ref >90)
Glucose, Bld: 151 mg/dL — ABNORMAL HIGH (ref 70–99)
POTASSIUM: 3.5 mmol/L (ref 3.5–5.1)
SODIUM: 138 mmol/L (ref 135–145)
TOTAL PROTEIN: 7.7 g/dL (ref 6.0–8.3)
Total Bilirubin: 0.6 mg/dL (ref 0.3–1.2)

## 2014-08-26 LAB — RAPID URINE DRUG SCREEN, HOSP PERFORMED
AMPHETAMINES: NOT DETECTED
BENZODIAZEPINES: NOT DETECTED
Barbiturates: NOT DETECTED
Cocaine: POSITIVE — AB
Opiates: NOT DETECTED
TETRAHYDROCANNABINOL: NOT DETECTED

## 2014-08-26 LAB — ACETAMINOPHEN LEVEL: Acetaminophen (Tylenol), Serum: <10 ug/mL — ABNORMAL LOW (ref 10–30)

## 2014-08-26 LAB — CBC
HEMATOCRIT: 41.7 % (ref 39.0–52.0)
Hemoglobin: 13.2 g/dL (ref 13.0–17.0)
MCH: 29.1 pg (ref 26.0–34.0)
MCHC: 31.7 g/dL (ref 30.0–36.0)
MCV: 91.9 fL (ref 78.0–100.0)
Platelets: 229 10*3/uL (ref 150–400)
RBC: 4.54 MIL/uL (ref 4.22–5.81)
RDW: 14.2 % (ref 11.5–15.5)
WBC: 6.7 10*3/uL (ref 4.0–10.5)

## 2014-08-26 LAB — ETHANOL: Alcohol, Ethyl (B): <5 mg/dL (ref 0–9)

## 2014-08-26 LAB — SALICYLATE LEVEL: Salicylate Lvl: <4 mg/dL (ref 2.8–20.0)

## 2014-08-26 MED ORDER — ACETAMINOPHEN 325 MG PO TABS
650.0000 mg | ORAL_TABLET | Freq: Once | ORAL | Status: AC
  Administered 2014-08-26: 650 mg via ORAL
  Filled 2014-08-26: qty 2

## 2014-08-26 NOTE — BH Assessment (Addendum)
Tele Assessment Note   Sharolyn DouglasFredrick L Mcreynolds is an 54 y.o. malewho came is after calling EMS because he was feeling suicidal, homicidal and paranoid. He says he has been yelling at people in the road and threatening them.  "I want to kill everyone with my magic". He says he is paranoid and hearing voices and "needs his shot" and to get back on his medications. He says he has not had them in a couple of weeks, although he receives Op services at [facility]. He says he is feeing suicidal and has tried to "bang his head against the wall" in the past to kill himself.  Pt says he lives with someone and has an apartment, but the person with whom he lives is never home. He says he uses cocaine, and has been using this week with his last use being last night.  He says [facility], and has since been in jail, and got some medications while there.  Pt had pressured speech, irritable mood, restless movement during assessment, and pt has a long history of psych admissions at [facility], [facility], etc.  There is no evidence of responding to internal stimuli at this time.  Disposition pending psych consultation.  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 Diabetes mellitus without complication   . Hypertension   . Schizophrenia   . CHF (congestive heart failure)   . Neuropathy   . Polysubstance abuse   . Cocaine abuse   . Homelessness   . HIV (human immunodeficiency virus infection)   . Hepatitis C   . Chronic foot pain    Axis IV: economic problems, problems related to legal system/crime and problems related to social environment Axis V: 31-40 impairment in reality testing  Past Medical History:  Past Medical History  Diagnosis Date  . Diabetes mellitus without complication   . Hypertension   . Schizophrenia   . CHF (congestive heart failure)   . Neuropathy   . Polysubstance abuse   . Cocaine abuse   .  Homelessness   . HIV (human immunodeficiency virus infection)   . Hepatitis C   . Chronic foot pain     History reviewed. No pertinent past surgical history.  Family History:  Family History  Problem Relation Age of Onset  . Hypertension Other   . Diabetes Other     Social History:  reports that he has been smoking Cigarettes.  He has a 20 pack-year smoking history. He uses smokeless tobacco. He reports that he drinks alcohol. He reports that he uses illicit drugs ("Crack" cocaine, Cocaine, and Marijuana) about 7 times per week.  Additional Social History:  Alcohol / Drug Use Pain Medications: denies Prescriptions: denies Over the Counter: denies Longest period of sobriety (when/how long): denies Negative Consequences of Use: Financial, Legal, Personal relationships Substance #1 Name of Substance 1: etoh 1 - Age of First Use: 8 1 - Amount (size/oz): varies 1 - Frequency: "once in a blue moon" reports usually drinks at celebrations 1 - Duration: on and off for years  1 - Last Use / Amount: denies Substance #2 Name of Substance 2: cocaine 2 - Age of First Use: 7320s 2 - Amount (size/oz): usually $100.00 2 - Frequency: daily when not incarcerated 2 - Duration: on and off for years  2 - Last Use / Amount:  (08/15/14)  CIWA: CIWA-Ar BP: 173/95 mmHg Pulse  Rate: 86 COWS:    PATIENT STRENGTHS: (choose at least two) Average or above average intelligence Capable of independent living Communication skills  Allergies:  Allergies  Allergen Reactions  . Haldol [Haloperidol] Other (See Comments)    Muscle spasms, loss of voluntary movement. However, pt has taken Thorazine on multiple occasions with no adverse effects.     Home Medications:  (Not in a hospital admission)  OB/GYN Status:  No LMP for male patient.  General Assessment Data Location of Assessment: [facility] Is this a Tele or Face-to-Face Assessment?: Face-to-Face Is this an Initial Assessment or a Re-assessment  for this encounter?: Initial Assessment Living Arrangements: Alone Can pt return to current living arrangement?: Yes Admission Status: Voluntary Is patient capable of signing voluntary admission?: Yes Transfer from: Home Referral Source: Self/Family/Friend     [facility] Crisis Care Plan Living Arrangements: Alone Name of Psychiatrist: [name]  Name of Therapist: none  Education Status Is patient currently in school?: No  Risk to self with the past 6 months Suicidal Ideation: Yes-Currently Present Suicidal Intent: No Is patient at risk for suicide?: Yes Suicidal Plan?: Yes-Currently Present Specify Current Suicidal Plan:  (bang his head against the wall) Access to Means: Yes Specify Access to Suicidal Means:  (environment) What has been your use of drugs/alcohol within the last 12 months?:  (see SA section) Previous Attempts/Gestures: Yes How many times?: 1 Triggers for Past Attempts: None known Intentional Self Injurious Behavior: None Family Suicide History: No Recent stressful life event(s): Financial Problems, Legal Issues Persecutory voices/beliefs?: Yes Depression: Yes Depression Symptoms: Insomnia, Isolating, Loss of interest in usual pleasures, Feeling worthless/self pity, Feeling angry/irritable, Despondent Substance abuse history and/or treatment for substance abuse?: Yes Suicide prevention information given to non-admitted patients: Not applicable  Risk to Others within the past 6 months Homicidal Ideation: Yes-Currently Present Thoughts of Harm to Others: Yes-Currently Present Comment - Thoughts of Harm to Others:  (feelings of wanting to hurt others no specifics) Current Homicidal Intent: No Current Homicidal Plan: No Access to Homicidal Means: Yes Describe Access to Homicidal Means:  (knife) Identified Victim:  (none) History of harm to others?: Yes Assessment of Violence: In distant past Violent Behavior Description:  (fighting, threatening) Does patient  have access to weapons?: Yes (Comment) (knife) Criminal Charges Pending?: No Does patient have a court date: No  Psychosis Hallucinations: Auditory Delusions: Persecutory  Mental Status Report Appear/Hygiene: Disheveled Eye Contact: Good Motor Activity: Hyperactivity Speech: Logical/coherent, Pressured Level of Consciousness: Alert, Irritable Mood: Anxious Affect: Anxious, Irritable Anxiety Level: Moderate Thought Processes: Coherent, Relevant Judgement: Impaired Orientation: Not oriented Obsessive Compulsive Thoughts/Behaviors: Moderate  Cognitive Functioning Concentration: Decreased Memory: Recent Impaired, Remote Intact IQ: Average Insight: Poor Impulse Control: Poor Appetite: Poor Weight Loss: 0 Weight Gain: 0 Sleep: Decreased Total Hours of Sleep: 3 Vegetative Symptoms: Decreased grooming  ADLScreening [facility]) Patient's cognitive ability adequate to safely complete daily activities?: Yes Patient able to express need for assistance with ADLs?: Yes Independently performs ADLs?: Yes (appropriate for developmental age)  Prior Inpatient Therapy Prior Inpatient Therapy: Yes Prior Therapy Dates: Multiple admissions Prior Therapy Facilty/Provider(s): [facility], [facility] Reason for Treatment: Psychosis, SI/HI  Prior Outpatient Therapy Prior Outpatient Therapy: Yes Prior Therapy Dates: Current Prior Therapy Facilty/Provider(s): [name] Reason for Treatment: medication management denies ACTT  ADL Screening (condition at time of admission) Patient's cognitive ability adequate to safely complete daily activities?: Yes Is the patient deaf or have difficulty hearing?: No Does the patient have difficulty seeing, even when wearing glasses/contacts?:  No Does the patient have difficulty concentrating, remembering, or making decisions?: Yes Patient able to express need for assistance with ADLs?: Yes Does the patient have difficulty dressing or bathing?:  No Independently performs ADLs?: Yes (appropriate for developmental age) Does the patient have difficulty walking or climbing stairs?: Yes Weakness of Legs: None Weakness of Arms/Hands: None  Home Assistive Devices/Equipment Home Assistive Devices/Equipment: Cane (specify quad or straight)    Abuse/Neglect Assessment (Assessment to be complete while patient is alone) Physical Abuse: Denies Verbal Abuse: Denies Sexual Abuse: Denies Exploitation of patient/patient's resources: Denies Self-Neglect: Denies Values / Beliefs Cultural Requests During Hospitalization: None Spiritual Requests During Hospitalization: None   Advance Directives (For Healthcare) Does patient have an advance directive?: No Would patient like information on creating an advanced directive?: No - patient declined information    Additional Information 1:1 In Past 12 Months?: No CIRT Risk: Yes Elopement Risk: No Does patient have medical clearance?: Yes     Disposition:  Disposition Initial Assessment Completed for this Encounter: Yes Disposition of Patient: Other dispositions Other disposition(s):  (pending psych disposition)  Blas Riches Hines 08/26/2014 7:59 AM

## 2014-08-26 NOTE — Consult Note (Signed)
[facility] Face-to[facility]Face [facility]iat[facility] Consult   Reason for Consult:  Cocaine abuse with substance induced mood disorder Referring Physician:  EDP Patient Identification: [name] MRN:  [id number] Principal Diagnosis: Substance induced mood disorder Diagnosis:   Patient Active Problem List   Diagnosis Date Noted  . Substance induced mood disorder [F19.94] 08/26/2014    Priority: High  . Schizophrenia [F20.9] 08/11/2014    Priority: High  . Cocaine use disorder, severe, dependence [F14.20]     Priority: High  . 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 Acute CHF (congestive heart failure) [I50.9] 07/29/2014  . Essential hypertension, benign [I10] 03/28/2013  . Diabetes mellitus [E11.9] 03/15/2013    Total Time spent with patient: 45 minutes  Subjective:   [name] [name] is a 54 y.o. male patient does not warrant admission.  HPI:  The patient was abusing cocaine last night and started screaming at cars to give him money when he was panhandling.  He was feeling paranoid last night after the cocaine.  [name] has slept and eaten since admission.  He is well-known to this facility and providers.  [name] is not suicidal/homicidal or psychotic today.  He is stable for discharge, bus pass given.  [name] stated his check comes on the 3rd and is looking for a place to stay, shelter resources given. HPI Elements:   Location:  generalized. Quality:  acute. Severity:  mild. Timing:  intermittent. Duration:  brief. Context:  stressors.  Past Medical History:  Past Medical History  Diagnosis Date  . Diabetes mellitus without complication   . Hypertension   . Schizophrenia   . CHF (congestive heart failure)   . Neuropathy   . Polysubstance abuse   . Cocaine abuse   . Homelessness   . HIV (human immunodeficiency virus infection)   . Hepatitis C   . Chronic foot pain    History reviewed. No pertinent past  surgical history. Family History:  Family History  Problem Relation Age of Onset  . Hypertension Other   . Diabetes Other    Social History:  History  Alcohol Use  . Yes    Comment: Pt reports "on special occasions"     History  Drug Use  . 7.00 per week  . Special: "Crack" cocaine, Cocaine, Marijuana    Comment: crack, used yesterday    History   Social History  . Marital Status: Divorced    Spouse Name: N/A  . Number of Children: N/A  . Years of Education: N/A   Social History Main Topics  . Smoking status: Current Every Day Smoker -- 1.00 packs/day for 20 years    Types: Cigarettes  . Smokeless tobacco: Current User  . Alcohol Use: Yes     Comment: Pt reports "on special occasions"  . Drug Use: 7.00 per week    Special: "Crack" cocaine, Cocaine, Marijuana     Comment: crack, used yesterday  . Sexual Activity: No   Other Topics Concern  . None   Social History Narrative   ** Merged History Encounter **       Additional Social History:    Pain Medications: denies Prescriptions: denies Over the Counter: denies Longest period of sobriety (when/how long): denies Negative Consequences of Use: Financial, Legal, Personal relationships Name of Substance 1: etoh 1 - Age of First Use: 8 1 - Amount (size/oz): varies 1 - Frequency: "once in a blue moon" reports usually drinks at celebrations 1 - Duration:  on and off for years  1 - Last Use / Amount: denies Name of Substance 2: cocaine 2 - Age of First Use: 81s 2 - Amount (size/oz): usually $100.00 2 - Frequency: daily when not incarcerated 2 - Duration: on and off for years  2 - Last Use / Amount:  (08/15/14)                 Allergies:   Allergies  Allergen Reactions  . Haldol [Haloperidol] Other (See Comments)    Muscle spasms, loss of voluntary movement. However, pt has taken Thorazine on multiple occasions with no adverse effects.     Vitals: Blood pressure 173/95, pulse 86, temperature 97.8 F  (36.6 C), temperature source Oral, resp. rate 16, SpO2 100 %.  Risk to Self: Suicidal Ideation: Yes-Currently Present Suicidal Intent: No Is patient at risk for suicide?: Yes Suicidal Plan?: Yes-Currently Present Specify Current Suicidal Plan:  (bang his head against the wall) Access to Means: Yes Specify Access to Suicidal Means:  (environment) What has been your use of drugs/alcohol within the last 12 months?:  (see SA section) How many times?: 1 Triggers for Past Attempts: None known Intentional Self Injurious Behavior: None Risk to Others: Homicidal Ideation: Yes-Currently Present Thoughts of Harm to Others: Yes-Currently Present Comment - Thoughts of Harm to Others:  (feelings of wanting to hurt others no specifics) Current Homicidal Intent: No Current Homicidal Plan: No Access to Homicidal Means: Yes Describe Access to Homicidal Means:  (knife) Identified Victim:  (none) History of harm to others?: Yes Assessment of Violence: In distant past Violent Behavior Description:  (fighting, threatening) Does patient have access to weapons?: Yes (Comment) (knife) Criminal Charges Pending?: No Does patient have a court date: No Prior Inpatient Therapy: Prior Inpatient Therapy: Yes Prior Therapy Dates: Multiple admissions Prior Therapy Facilty/Provider(s): [facility], [facility] Reason for Treatment: Psychosis, SI/HI Prior Outpatient Therapy: Prior Outpatient Therapy: Yes Prior Therapy Dates: Current Prior Therapy Facilty/Provider(s): [name] Reason for Treatment: medication management denies ACTT  No current facility-administered medications for this encounter.   Current Outpatient Prescriptions  Medication Sig Dispense Refill  . ARIPiprazole (ABILIFY) 10 MG tablet Take 1 tablet (10 mg total) by mouth daily. For mood control 30 tablet 0  . [START ON 09/12/2014] ARIPiprazole 400 MG SUSR Inject 400 mg into the muscle every 30 (thirty) days. (Due to be given on 09-12-14 @ 08:00 am):For  mood control 1 each 0  . aspirin 81 MG EC tablet Take 1 tablet (81 mg total) by mouth daily. For heart health 30 tablet 3  . benazepril (LOTENSIN) 5 MG tablet Take 1 tablet (5 mg total) by mouth daily. For high blood pressure 30 tablet 0  . benztropine (COGENTIN) 2 MG tablet Take 1 tablet (2 mg total) by mouth 2 (two) times daily. For prevention of drug induced tremors 60 tablet 0  . cloNIDine (CATAPRES) 0.1 MG tablet Take 1 tablet (0.1 mg total) by mouth 2 (two) times daily. For high blood pressure 60 tablet 0  . furosemide (LASIX) 20 MG tablet Take 1 tablet (20 mg total) by mouth daily. For swellings/high blood pressure 30 tablet 3  . metFORMIN (GLUCOPHAGE) 500 MG tablet Take 1 tablet (500 mg total) by mouth daily with breakfast. For high blood sugar control 30 tablet 0  . tamsulosin (FLOMAX) 0.4 MG CAPS capsule Take 1 capsule (0.4 mg total) by mouth daily after supper. For prostate health 30 capsule 0  . traZODone (DESYREL) 50 MG tablet Take 1 tablet (  50 mg total) by mouth at bedtime as needed for sleep. 30 tablet 0    Musculoskeletal: Strength & Muscle Tone: within normal limits Gait & Station: normal Patient leans: N/A  Psychiatric Specialty Exam:     Blood pressure 173/95, pulse 86, temperature 97.8 F (36.6 C), temperature source Oral, resp. rate 16, SpO2 100 %.There is no weight on file to calculate BMI.  General Appearance: Casual  Eye Contact::  Good  Speech:  Normal Rate  Volume:  Normal  Mood:  Euthymic  Affect:  Congruent  Thought Process:  Coherent  Orientation:  Full (Time, Place, and Person)  Thought Content:  WDL  Suicidal Thoughts:  No  Homicidal Thoughts:  No  Memory:  Immediate;   Good Recent;   Good Remote;   Good  Judgement:  Fair  Insight:  Fair  Psychomotor Activity:  Normal  Concentration:  Good  Recall:  Good  Fund of Knowledge:Fair  Language: Good  Akathisia:  No  Handed:  Right  AIMS (if indicated):     Assets:  Leisure Time Physical  Health Resilience  ADL's:  Intact  Cognition: WNL  Sleep:      Medical Decision Making: Review of Psycho-Social Stressors (1), Review or order clinical lab tests (1) and Review of Medication Regimen & Side Effects (2)  Treatment Plan Summary: Daily contact with patient to assess and evaluate symptoms and progress in treatment, Medication management and Plan Discharge and follow-up with [name]  Plan:  No evidence of imminent risk to self or others at present.   Disposition: Discharge and follow-up with Nolene Ebbs, PMH-NP 08/26/2014 11:10 AM I have personally seen the patient and agreed with the findings and involved in the treatment plan. Thedore Mins, MD

## 2014-08-26 NOTE — Progress Notes (Signed)
Per psychiatrist, patient psychiatrically stable for discharge home. Pt plans to follow up with [facility] pt current outpatient provider. Pt provided with bus pass.   Byrd HesselbachKristen Glenetta Kiger, LCSW [phone]  ED CSW 08/26/2014 10:39 AM

## 2014-08-26 NOTE — [facility] Suicide Risk Assessment (Signed)
Suicide Risk Assessment  Discharge Assessment   [facility]   Demographic Factors:  Male  Total Time spent with patient: 45 minutes  Musculoskeletal: Strength & Muscle Tone: within normal limits Gait & Station: normal Patient leans: N/A  Psychiatric Specialty Exam:     Blood pressure 173/95, pulse 86, temperature 97.8 F (36.6 C), temperature source Oral, resp. rate 16, SpO2 100 %.There is no weight on file to calculate BMI.  General Appearance: Casual  Eye Contact::  Good  Speech:  Normal Rate  Volume:  Normal  Mood:  Euthymic  Affect:  Congruent  Thought Process:  Coherent  Orientation:  Full (Time, Place, and Person)  Thought Content:  WDL  Suicidal Thoughts:  No  Homicidal Thoughts:  No  Memory:  Immediate;   Good Recent;   Good Remote;   Good  Judgement:  Fair  Insight:  Fair  Psychomotor Activity:  Normal  Concentration:  Good  Recall:  Good  Fund of Knowledge:Fair  Language: Good  Akathisia:  No  Handed:  Right  AIMS (if indicated):     Assets:  Leisure Time Physical Health Resilience  ADL's:  Intact  Cognition: WNL  Sleep:         Has this patient used any form of tobacco in the last 30 days? (Cigarettes, Smokeless Tobacco, Cigars, and/or Pipes) Yes, A prescription for an FDA-approved tobacco cessation medication was offered at discharge and the patient refused  Mental Status Per Nursing Assessment::   On Admission:   Cocaine abuse with paranoia  Current Mental Status by Physician: NA  Loss Factors: NA  Historical Factors: NA  Risk Reduction Factors:   Sense of responsibility to family and Positive therapeutic relationship  Continued Clinical Symptoms:  None  Cognitive Features That Contribute To Risk:  None    Suicide Risk:  Minimal: No identifiable suicidal ideation.  Patients presenting with no risk factors but with morbid ruminations; may be classified as minimal risk based on the severity of the  depressive symptoms  Principal Problem: Substance induced mood disorder Discharge Diagnoses:  Patient Active Problem List   Diagnosis Date Noted  . Substance induced mood disorder [F19.94] 08/26/2014    Priority: High  . Schizophrenia [F20.9] 08/11/2014    Priority: High  . Cocaine use disorder, severe, dependence [F14.20]     Priority: High  . 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 Acute CHF (congestive heart failure) [I50.9] 07/29/2014  . Essential hypertension, benign [I10] 03/28/2013  . Diabetes mellitus [E11.9] 03/15/2013    Follow-up Information    [facility]   Specialty:  [facility]   Contact information:   [address] [place] [id number] [phone]       Plan Of Care/Follow-up recommendations:  Activity:  as tolerated Diet:  heart healthy diet  Is patient on multiple antipsychotic therapies at discharge:  No   Has Patient had three or more failed trials of antipsychotic monotherapy by history:  No  Recommended Plan for Multiple Antipsychotic Therapies: NA    Tyishia Aune, PMH-NP 08/26/2014, 11:19 AM

## 2014-08-26 NOTE — ED Provider Notes (Signed)
CSN: [id number]     Arrival date & time 08/26/14  0212 History   First MD Initiated Contact with Patient 08/26/14 [phone]     Chief Complaint  Patient presents with  . Suicidal      HPI Pt was seen at 0710. Per pt, c/o gradual onset and persistence of constant SI for the past several days. Pt states "don't you know me?" and "I'm the one that's been out in the streets yelling at people." States he "wants to kill people with my magic." Endorses HI and "hearing voices."  Wants to kill himself by "banging my head against the wall."  The symptoms have been associated with no other complaints. The patient has a significant history of similar symptoms previously, recently being evaluated for this complaint and multiple prior evals for same. Pt was d/c from [facility] 2 weeks ago.     Past Medical History  Diagnosis Date  . Diabetes mellitus without complication   . Hypertension   . Schizophrenia   . CHF (congestive heart failure)   . Neuropathy   . Polysubstance abuse   . Cocaine abuse   . Homelessness   . HIV (human immunodeficiency virus infection)   . Hepatitis C   . Chronic foot pain    History reviewed. No pertinent past surgical history.   Family History  Problem Relation Age of Onset  . Hypertension Other   . Diabetes Other    History  Substance Use Topics  . Smoking status: Current Every Day Smoker -- 1.00 packs/day for 20 years    Types: Cigarettes  . Smokeless tobacco: Current User  . Alcohol Use: Yes     Comment: Pt reports "on special occasions"    Review of Systems ROS: Statement: All systems negative except as marked or noted in the HPI; Constitutional: Negative for fever and chills. ; ; Eyes: Negative for eye pain, redness and discharge. ; ; ENMT: Negative for ear pain, hoarseness, nasal congestion, sinus pressure and sore throat. ; ; Cardiovascular: Negative for chest pain, palpitations, diaphoresis, dyspnea and peripheral edema. ; ; Respiratory: Negative for cough,  wheezing and stridor. ; ; Gastrointestinal: Negative for nausea, vomiting, diarrhea, abdominal pain, blood in stool, hematemesis, jaundice and rectal bleeding. . ; ; Genitourinary: Negative for dysuria, flank pain and hematuria. ; ; Musculoskeletal: Negative for back pain and neck pain. Negative for swelling and trauma.; ; Skin: Negative for pruritus, rash, abrasions, blisters, bruising and skin lesion.; ; Neuro: Negative for headache, lightheadedness and neck stiffness. Negative for weakness, altered level of consciousness , altered mental status, extremity weakness, paresthesias, involuntary movement, seizure and syncope.; Psych:  +SI, "hearing voices." No SA.     Allergies  Haldol  Home Medications   Prior to Admission medications   Medication Sig Start Date End Date Taking? Authorizing Provider  ARIPiprazole (ABILIFY) 10 MG tablet Take 1 tablet (10 mg total) by mouth daily. For mood control 08/12/14  Yes Sanjuana Kava, NP  ARIPiprazole 400 MG SUSR Inject 400 mg into the muscle every 30 (thirty) days. (Due to be given on 09-12-14 @ 08:00 am):For mood control 09/12/14  Yes Sanjuana Kava, NP  aspirin 81 MG EC tablet Take 1 tablet (81 mg total) by mouth daily. For heart health 08/12/14  Yes Sanjuana Kava, NP  benazepril (LOTENSIN) 5 MG tablet Take 1 tablet (5 mg total) by mouth daily. For high blood pressure 08/12/14  Yes Sanjuana Kava, NP  benztropine (COGENTIN) 2 MG tablet Take  1 tablet (2 mg total) by mouth 2 (two) times daily. For prevention of drug induced tremors 08/12/14  Yes Sanjuana Kava, NP  cloNIDine (CATAPRES) 0.1 MG tablet Take 1 tablet (0.1 mg total) by mouth 2 (two) times daily. For high blood pressure 08/12/14  Yes Sanjuana Kava, NP  furosemide (LASIX) 20 MG tablet Take 1 tablet (20 mg total) by mouth daily. For swellings/high blood pressure 08/12/14  Yes Sanjuana Kava, NP  metFORMIN (GLUCOPHAGE) 500 MG tablet Take 1 tablet (500 mg total) by mouth daily with breakfast. For high blood sugar  control 08/12/14  Yes Sanjuana Kava, NP  tamsulosin (FLOMAX) 0.4 MG CAPS capsule Take 1 capsule (0.4 mg total) by mouth daily after supper. For prostate health 08/12/14  Yes Sanjuana Kava, NP  traZODone (DESYREL) 50 MG tablet Take 1 tablet (50 mg total) by mouth at bedtime as needed for sleep. 08/12/14  Yes Sanjuana Kava, NP   BP 173/95 mmHg  Pulse 86  Temp(Src) 97.8 F (36.6 C) (Oral)  Resp 16  SpO2 100% Physical Exam  0715: Physical examination:  Nursing notes reviewed; Vital signs and O2 SAT reviewed;  Constitutional: Disheveled, Well hydrated, In no acute distress; Head:  Normocephalic, atraumatic; Eyes: EOMI, PERRL, No scleral icterus; ENMT: Mouth and pharynx normal, Mucous membranes moist; Neck: Supple, Full range of motion, No lymphadenopathy; Cardiovascular: Regular rate and rhythm, No gallop; Respiratory: Breath sounds clear & equal bilaterally, No rales, rhonchi, wheezes.  Speaking full sentences with ease, Normal respiratory effort/excursion; Chest: Nontender, Movement normal; Abdomen: Soft, Nontender, Nondistended, Normal bowel sounds; Extremities: Pulses normal, No tenderness, No edema, No calf edema or asymmetry.; Neuro: AA&Ox3, Major CN grossly intact.  Speech clear. No gross focal motor or sensory deficits in extremities. Climbs on and off chair in room easily by himself. Gait steady.; Skin: Color normal, Warm, Dry.; Psych:  Rapid, pressured speech.    ED Course  Procedures  MDM  MDM Reviewed: previous chart, nursing note and vitals Reviewed previous: labs Interpretation: labs      Results for orders placed or performed during the hospital encounter of 08/26/14  Acetaminophen level  Result Value Ref Range   Acetaminophen (Tylenol), Serum <10.0 (L) 10 - 30 ug/mL  CBC  Result Value Ref Range   WBC 6.7 4.0 - 10.5 K/uL   RBC 4.54 4.22 - 5.81 MIL/uL   Hemoglobin 13.2 13.0 - 17.0 g/dL   HCT 81.1 91.4 - 78.2 %   MCV 91.9 78.0 - 100.0 fL   MCH 29.1 26.0 - 34.0 pg   MCHC  31.7 30.0 - 36.0 g/dL   RDW 95.6 21.3 - 08.6 %   Platelets 229 150 - 400 K/uL  Comprehensive metabolic panel  Result Value Ref Range   Sodium 138 135 - 145 mmol/L   Potassium 3.5 3.5 - 5.1 mmol/L   Chloride 102 96 - 112 mmol/L   CO2 30 19 - 32 mmol/L   Glucose, Bld 151 (H) 70 - 99 mg/dL   BUN 10 6 - 23 mg/dL   Creatinine, Ser 5.78 0.50 - 1.35 mg/dL   Calcium 9.6 8.4 - 46.9 mg/dL   Total Protein 7.7 6.0 - 8.3 g/dL   Albumin 4.3 3.5 - 5.2 g/dL   AST 26 0 - 37 U/L   ALT 13 0 - 53 U/L   Alkaline Phosphatase 103 39 - 117 U/L   Total Bilirubin 0.6 0.3 - 1.2 mg/dL   GFR calc non Af Amer >90 >  90 mL/min   GFR calc Af Amer >90 >90 mL/min   Anion gap 6 5 - 15  Ethanol (ETOH)  Result Value Ref Range   Alcohol, Ethyl (B) <5 0 - 9 mg/dL  Salicylate level  Result Value Ref Range   Salicylate Lvl <4.0 2.8 - 20.0 mg/dL  Urine Drug Screen  Result Value Ref Range   Opiates NONE DETECTED NONE DETECTED   Cocaine POSITIVE (A) NONE DETECTED   Benzodiazepines NONE DETECTED NONE DETECTED   Amphetamines NONE DETECTED NONE DETECTED   Tetrahydrocannabinol NONE DETECTED NONE DETECTED   Barbiturates NONE DETECTED NONE DETECTED     0730:  TTS to evaluate and dispo.     Samuel JesterKathleen Katherine Tout, DO 08/29/14 1120

## 2014-08-26 NOTE — ED Notes (Signed)
Attempted to call report.  Nurse states that she just got here and will call me back.

## 2014-08-26 NOTE — ED Notes (Signed)
Pt brought in by EMS with c/o suicidal

## 2014-08-28 ENCOUNTER — Encounter (HOSPITAL_COMMUNITY): Payer: Self-pay | Admitting: Emergency Medicine

## 2014-08-28 ENCOUNTER — Emergency Department (HOSPITAL_COMMUNITY)
Admission: EM | Admit: 2014-08-28 | Discharge: 2014-08-28 | Disposition: A | Discharge status: Home / Self Care | Payer: Medicare Other | Attending: Emergency Medicine | Admitting: Emergency Medicine

## 2014-08-28 DIAGNOSIS — R45851 Suicidal ideations: Secondary | ICD-10-CM | POA: Diagnosis present

## 2014-08-28 DIAGNOSIS — M79672 Pain in left foot: Secondary | ICD-10-CM | POA: Insufficient documentation

## 2014-08-28 DIAGNOSIS — F1994 Other psychoactive substance use, unspecified with psychoactive substance-induced mood disorder: Secondary | ICD-10-CM

## 2014-08-28 DIAGNOSIS — Z8669 Personal history of other diseases of the nervous system and sense organs: Secondary | ICD-10-CM | POA: Diagnosis not present

## 2014-08-28 DIAGNOSIS — Z720 Tobacco use: Secondary | ICD-10-CM | POA: Insufficient documentation

## 2014-08-28 DIAGNOSIS — F1420 Cocaine dependence, uncomplicated: Secondary | ICD-10-CM | POA: Diagnosis present

## 2014-08-28 DIAGNOSIS — Z79899 Other long term (current) drug therapy: Secondary | ICD-10-CM | POA: Insufficient documentation

## 2014-08-28 DIAGNOSIS — Z7982 Long term (current) use of aspirin: Secondary | ICD-10-CM | POA: Diagnosis not present

## 2014-08-28 DIAGNOSIS — I509 Heart failure, unspecified: Secondary | ICD-10-CM | POA: Insufficient documentation

## 2014-08-28 DIAGNOSIS — G8929 Other chronic pain: Secondary | ICD-10-CM | POA: Insufficient documentation

## 2014-08-28 DIAGNOSIS — Z590 Homelessness: Secondary | ICD-10-CM | POA: Diagnosis not present

## 2014-08-28 DIAGNOSIS — Z8619 Personal history of other infectious and parasitic diseases: Secondary | ICD-10-CM | POA: Insufficient documentation

## 2014-08-28 DIAGNOSIS — I10 Essential (primary) hypertension: Secondary | ICD-10-CM | POA: Diagnosis not present

## 2014-08-28 DIAGNOSIS — E119 Type 2 diabetes mellitus without complications: Secondary | ICD-10-CM | POA: Diagnosis not present

## 2014-08-28 DIAGNOSIS — M79671 Pain in right foot: Secondary | ICD-10-CM | POA: Insufficient documentation

## 2014-08-28 LAB — COMPREHENSIVE METABOLIC PANEL
ALT: 11 U/L (ref 0–53)
AST: 20 U/L (ref 0–37)
Albumin: 4.1 g/dL (ref 3.5–5.2)
Alkaline Phosphatase: 101 U/L (ref 39–117)
Anion gap: 9 (ref 5–15)
BUN: 13 mg/dL (ref 6–23)
CALCIUM: 9.4 mg/dL (ref 8.4–10.5)
CO2: 27 mmol/L (ref 19–32)
Chloride: 102 mmol/L (ref 96–112)
Creatinine, Ser: 0.8 mg/dL (ref 0.50–1.35)
GFR calc Af Amer: >90 mL/min (ref >90)
GFR calc non Af Amer: >90 mL/min (ref >90)
GLUCOSE: 148 mg/dL — AB (ref 70–99)
Potassium: 3.4 mmol/L — ABNORMAL LOW (ref 3.5–5.1)
Sodium: 138 mmol/L (ref 135–145)
Total Bilirubin: 0.6 mg/dL (ref 0.3–1.2)
Total Protein: 7.3 g/dL (ref 6.0–8.3)

## 2014-08-28 LAB — ACETAMINOPHEN LEVEL

## 2014-08-28 LAB — ETHANOL: Alcohol, Ethyl (B): <5 mg/dL (ref 0–9)

## 2014-08-28 LAB — RAPID URINE DRUG SCREEN, HOSP PERFORMED
Amphetamines: NOT DETECTED
Barbiturates: NOT DETECTED
Benzodiazepines: NOT DETECTED
Cocaine: POSITIVE — AB
Opiates: NOT DETECTED
Tetrahydrocannabinol: NOT DETECTED

## 2014-08-28 LAB — CBC
HCT: 41.9 % (ref 39.0–52.0)
Hemoglobin: 12.8 g/dL — ABNORMAL LOW (ref 13.0–17.0)
MCH: 28.1 pg (ref 26.0–34.0)
MCHC: 30.5 g/dL (ref 30.0–36.0)
MCV: 91.9 fL (ref 78.0–100.0)
PLATELETS: 248 10*3/uL (ref 150–400)
RBC: 4.56 MIL/uL (ref 4.22–5.81)
RDW: 14 % (ref 11.5–15.5)
WBC: 4.3 10*3/uL (ref 4.0–10.5)

## 2014-08-28 LAB — SALICYLATE LEVEL: Salicylate Lvl: <4 mg/dL (ref 2.8–20.0)

## 2014-08-28 MED ORDER — ACETAMINOPHEN 325 MG PO TABS
650.0000 mg | ORAL_TABLET | Freq: Once | ORAL | Status: AC
  Administered 2014-08-28: 650 mg via ORAL
  Filled 2014-08-28: qty 2

## 2014-08-28 NOTE — ED Notes (Signed)
Patient brought in by EMS and initially here in ED for a runny nose and foot pain Patient then stated that he was SI when triage staff informed patient that he would have to sit out in ED waiting room Patient well known to this ED for substance abuse and psych issues

## 2014-08-28 NOTE — ED Notes (Signed)
Pt left w/o AVS, stating he didn't need it.

## 2014-08-28 NOTE — [facility] Suicide Risk Assessment (Signed)
Suicide Risk Assessment  Discharge Assessment   [facility]   Demographic Factors:  Male  Total Time spent with patient: 45 minutes  Musculoskeletal: Strength & Muscle Tone: within normal limits Gait & Station: normal Patient leans: N/A  Psychiatric Specialty Exam:     Blood pressure 147/83, pulse 94, temperature 98.6 F (37 C), temperature source Oral, resp. rate 16, SpO2 95 %.There is no weight on file to calculate BMI.  General Appearance: Casual  Eye Contact::  Good  Speech:  Normal Rate  Volume:  Normal  Mood:  Euthymic  Affect:  Congruent  Thought Process:  Coherent  Orientation:  Full (Time, Place, and Person)  Thought Content:  WDL  Suicidal Thoughts:  No  Homicidal Thoughts:  No  Memory:  Immediate;   Good Recent;   Good Remote;   Good  Judgement:  Fair  Insight:  Fair  Psychomotor Activity:  Normal  Concentration:  Good  Recall:  Good  Fund of Knowledge:Fair  Language: Good  Akathisia:  No  Handed:  Right  AIMS (if indicated):     Assets:  Leisure Time Physical Health Resilience  ADL's:  Intact  Cognition: WNL  Sleep:          Has this patient used any form of tobacco in the last 30 days? (Cigarettes, Smokeless Tobacco, Cigars, and/or Pipes) Yes, A prescription for an FDA-approved tobacco cessation medication was offered at discharge and the patient refused  Mental Status Per Nursing Assessment::   On Admission:   Cocaine abuse with suicidal ideations  Current Mental Status by Physician: NA  Loss Factors: NA  Historical Factors: NA  Risk Reduction Factors:   Sense of responsibility to family, Positive social support and Positive therapeutic relationship  Continued Clinical Symptoms:  None  Cognitive Features That Contribute To Risk:  None    Suicide Risk:  Minimal: No identifiable suicidal ideation.  Patients presenting with no risk factors but with morbid ruminations; may be classified as minimal risk  based on the severity of the depressive symptoms  Principal Problem: Suicidal ideations Discharge Diagnoses:  Patient Active Problem List   Diagnosis Date Noted  . Suicidal ideations [R45.851] 08/28/2014    Priority: High  . Substance induced mood disorder [F19.94] 08/26/2014    Priority: High  . Schizophrenia [F20.9] 08/11/2014    Priority: High  . Cocaine use disorder, severe, dependence [F14.20]     Priority: High  . 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 Acute CHF (congestive heart failure) [I50.9] 07/29/2014  . Essential hypertension, benign [I10] 03/28/2013  . Diabetes mellitus [E11.9] 03/15/2013      Plan Of Care/Follow-up recommendations:  Activity:  as tolerated Diet:  heart healthy diet  Is patient on multiple antipsychotic therapies at discharge:  No   Has Patient had three or more failed trials of antipsychotic monotherapy by history:  No  Recommended Plan for Multiple Antipsychotic Therapies: NA    Aladdin Kollmann, PMH-NP 08/28/2014, 1:28 PM

## 2014-08-28 NOTE — ED Notes (Signed)
Pt in room, given coffee, graham crackers, and blanket. Pt NAD laying in bed.

## 2014-08-28 NOTE — ED Notes (Signed)
Patient states he is here for his foot pain, his high blood pressure, and he wants to speak to someone in behavioral health. Patient states he is suicidal. Patient states his plan is to kill himself with a gun that he has buried.

## 2014-08-28 NOTE — ED Notes (Signed)
Patient refusing to have VS updated

## 2014-08-28 NOTE — ED Notes (Addendum)
Per EMS, patient called 911 for his runny nose. Patient also complains of bilateral foot pain. Patient has hx of diabetes, HTN, and diabetic neuropathy. Patient states to EMS he did crack 1 hour ago.

## 2014-08-28 NOTE — ED Notes (Signed)
MD at bedside. 

## 2014-08-28 NOTE — BH Assessment (Signed)
Assessment Note  [name] is an 54 y.o. male. Patient was brought into the ED by EMS initiated by self because of foot pain and a runny nose.  Patient requested to see a behavioral health specialist for help.  Patient is currently endorsing suicidal ideation with plan to shoot but denies access to a gun.  Patient currently endorsing homicidal ideations with plan to kill others/strangers with "magic".  Patient reports he know powerful sorcery/voodu and wants to kill people with his powers.  "I be trying to kill people with my magic/voodu/sorcery, I know some powerful shit".  Patient reports he needs help with compliance with medications, he had been yelling/screaming at people, singing loudly, fidgety and fearful.  "I been raising Hell".  Patient denies having A/V hallucinations or other self injurious behaviors.    Patient reports smoking crack cocaine an hour before coming into the ED and is currently requesting some help with his drug use. Patient reports being willing to comply with all recommendations of treatment.    Disposition is pending.      Axis I: Substance Induced Mood Disorder and Cocaine use, severe Axis II: Deferred Axis III:  Past Medical History  Diagnosis Date  . Diabetes mellitus without complication   . Hypertension   . Schizophrenia   . CHF (congestive heart failure)   . Neuropathy   . Polysubstance abuse   . Cocaine abuse   . Homelessness   . HIV (human immunodeficiency virus infection)   . Hepatitis C   . Chronic foot pain    Axis IV: economic problems, housing problems, occupational problems, other psychosocial or environmental problems, problems related to legal system/crime, problems related to social environment, problems with access to health care services and problems with primary support group Axis V: 51-60 moderate symptoms  Past Medical History:  Past Medical History  Diagnosis Date  . Diabetes mellitus without complication   . Hypertension    . Schizophrenia   . CHF (congestive heart failure)   . Neuropathy   . Polysubstance abuse   . Cocaine abuse   . Homelessness   . HIV (human immunodeficiency virus infection)   . Hepatitis C   . Chronic foot pain     History reviewed. No pertinent past surgical history.  Family History:  Family History  Problem Relation Age of Onset  . Hypertension Other   . Diabetes Other     Social History:  reports that he has been smoking Cigarettes.  He has a 20 pack-year smoking history. He uses smokeless tobacco. He reports that he drinks alcohol. He reports that he uses illicit drugs ("Crack" cocaine, Cocaine, and Marijuana) about 7 times per week.  Additional Social History:     CIWA: CIWA-Ar BP: 147/83 mmHg Pulse Rate: 94 COWS:    Allergies:  Allergies  Allergen Reactions  . Haldol [Haloperidol] Other (See Comments)    Muscle spasms, loss of voluntary movement. However, pt has taken Thorazine on multiple occasions with no adverse effects.     Home Medications:  (Not in a hospital admission)  OB/GYN Status:  No LMP for male patient.  General Assessment Data Location of Assessment: [facility] ED ACT Assessment: Yes Is this a Tele or Face-to-Face Assessment?: Face-to-Face Is this an Initial Assessment or a Re-assessment for this encounter?: Initial Assessment Living Arrangements: Alone Can pt return to current living arrangement?: Yes Admission Status: Voluntary Is patient capable of signing voluntary admission?: Yes Transfer from: Home Referral Source: Self/Family/Friend  Medical Screening Exam (  [facility] Walk-in ONLY) Medical Exam completed: Yes  [facility] Living Arrangements: Alone [facility]  Name of Therapist: none  Education Status Is patient currently in school?: No Highest grade of school patient has completed: 5512 Name of school: NA Contact person: NA  Risk to self with the past 6 months Suicidal Ideation: Yes-Currently  Present Suicidal Intent: Yes-Currently Present Is patient at risk for suicide?: Yes Suicidal Plan?: Yes-Currently Present Specify Current Suicidal Plan: shoot self with gun Access to Means: No Specify Access to Suicidal Means: none What has been your use of drugs/alcohol within the last 12 months?: crack Previous Attempts/Gestures: Yes How many times?: 5 Other Self Harm Risks: none Triggers for Past Attempts: Other (Comment), Hallucinations (SA) Intentional Self Injurious Behavior: None Comment - Self Injurious Behavior: na Family Suicide History: No Recent stressful life event(s): Other (Comment) (SA) Persecutory voices/beliefs?: No Depression: Yes Depression Symptoms: Despondent, Guilt, Loss of interest in usual pleasures, Feeling worthless/self pity, Feeling angry/irritable Substance abuse history and/or treatment for substance abuse?: Yes  Risk to Others within the past 6 months Homicidal Ideation: Yes-Currently Present Thoughts of Harm to Others: Yes-Currently Present Comment - Thoughts of Harm to Others: with his magic Current Homicidal Intent: Yes-Currently Present Current Homicidal Plan: Yes-Currently Present Describe Current Homicidal Plan: to kill people with his powerful voodu Access to Homicidal Means: Yes Describe Access to Homicidal Means: he reports knowing magic, voodu, sorcery Identified Victim: stranger History of harm to others?: No Assessment of Violence: None Noted Violent Behavior Description: agitation,  Does patient have access to weapons?: No Criminal Charges Pending?:  (unknwon)  Psychosis Hallucinations: None noted Delusions: Grandiose  Mental Status Report Appear/Hygiene: Bizarre, In hospital gown Eye Contact: Poor Motor Activity: Unremarkable Speech: Pressured Level of Consciousness: Alert, Irritable Mood: Labile Affect: Anxious, Irritable, Labile Anxiety Level: Moderate Thought Processes: Tangential Judgement: Partial Orientation:  Person, Time, Place Obsessive Compulsive Thoughts/Behaviors: None  Cognitive Functioning Concentration: Fair Memory: Recent Intact, Remote Intact IQ: Average Insight: Fair Impulse Control: Fair Appetite: Fair Sleep: Decreased Vegetative Symptoms: None  ADLScreening [facility]) Patient's cognitive ability adequate to safely complete daily activities?: Yes Patient able to express need for assistance with ADLs?: Yes Independently performs ADLs?: Yes (appropriate for developmental age)  Prior Inpatient Therapy Prior Inpatient Therapy: Yes Prior Therapy Dates: Multiple admissions Prior Therapy Facilty/Provider(s): [facility] Reason for Treatment: Psychosis, SI/HI  Prior Outpatient Therapy Prior Outpatient Therapy: Yes Prior Therapy Dates: Current Prior Therapy Facilty/Provider([facility]): [name] Reason for Treatment: medication management denies ACTT  ADL Screening (condition at time of admission) Patient's cognitive ability adequate to safely complete daily activities?: Yes Patient able to express need for assistance with ADLs?: Yes Independently performs ADLs?: Yes (appropriate for developmental age)             Advance Directives (For Healthcare) Does patient have an advance directive?: No Would patient like information on creating an advanced directive?: No - patient declined information    Additional Information 1:1 In Past 12 Months?: No CIRT Risk: No Elopement Risk: No Does patient have medical clearance?: Yes     Disposition:  Disposition Initial Assessment Completed for this Encounter: Yes Disposition of Patient: Other dispositions Other disposition(s): Other (Comment) (Pending )  On Site Evaluation by:   Reviewed with Physician:    Maryelizabeth Rowanorbett, Sherrel Ploch A 08/28/2014 8:41 AM

## 2014-08-28 NOTE — ED Notes (Signed)
TTS at bedside. 

## 2014-08-28 NOTE — Consult Note (Signed)
[facility]   Reason for Consult:  Cocaine abuse with substance induced mood disorder Referring Physician:  EDP Patient Identification: Taylor DouglasFredrick L Seipp MRN:  [id number] Principal Diagnosis: Suicidal ideations Diagnosis:   Patient Active Problem List   Diagnosis Date Noted  . Suicidal ideations [R45.851] 08/28/2014    Priority: High  . Substance induced mood disorder [F19.94] 08/26/2014    Priority: High  . Schizophrenia [F20.9] 08/11/2014    Priority: High  . Cocaine use disorder, severe, dependence [F14.20]     Priority: High  . 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 Acute CHF (congestive heart failure) [I50.9] 07/29/2014  . Essential hypertension, benign [I10] 03/28/2013  . Diabetes mellitus [E11.9] 03/15/2013    Total Time spent with patient: 30 minutes  Subjective:   [name] is a 54 y.o. male patient does not warrant admission.  HPI:  The patient was abusing cocaine last night and came to the ED with suicidal ideations.  [name] is well-known to this facility and providers.  His typical presentation to the ED is after the use of cocaine with suicidal ideations, yelling at people for money, etc.  Once he sleeps, eats, and showers [name] is typically ready to go.  Denies suicidal/homicidal ideations, hallucinations, and alcohol abuse.  Stable for discharge. HPI Elements:   Location:  generalized. Quality:  acute. Severity:  mild. Timing:  intermittent. Duration:  brief. Context:  stressors.  Past Medical History:  Past Medical History  Diagnosis Date  . Diabetes mellitus without complication   . Hypertension   . Schizophrenia   . CHF (congestive heart failure)   . Neuropathy   . Polysubstance abuse   . Cocaine abuse   . Homelessness   . HIV (human immunodeficiency virus infection)   . Hepatitis C   . Chronic foot pain    History reviewed. No  pertinent past surgical history. Family History:  Family History  Problem Relation Age of Onset  . Hypertension Other   . Diabetes Other    Social History:  History  Alcohol Use  . Yes    Comment: Pt reports "on special occasions"     History  Drug Use  . 7.00 per week  . Special: "Crack" cocaine, Cocaine, Marijuana    Comment: crack, used yesterday    History   Social History  . Marital Status: Divorced    Spouse Name: N/A  . Number of Children: N/A  . Years of Education: N/A   Social History Main Topics  . Smoking status: Current Every Day Smoker -- 1.00 packs/day for 20 years    Types: Cigarettes  . Smokeless tobacco: Current User  . Alcohol Use: Yes     Comment: Pt reports "on special occasions"  . Drug Use: 7.00 per week    Special: "Crack" cocaine, Cocaine, Marijuana     Comment: crack, used yesterday  . Sexual Activity: No   Other Topics Concern  . None   Social History Narrative   ** Merged History Encounter **       Additional Social History:                          Allergies:   Allergies  Allergen Reactions  . Haldol [Haloperidol] Other (See Comments)    Muscle spasms, loss of voluntary movement. However, pt has taken Thorazine on multiple occasions with no adverse effects.  Vitals: Blood pressure 147/83, pulse 94, temperature 98.6 F (37 C), temperature source Oral, resp. rate 16, SpO2 95 %.  Risk to Self: Suicidal Ideation: Yes-Currently Present Suicidal Intent: Yes-Currently Present Is patient at risk for suicide?: Yes Suicidal Plan?: Yes-Currently Present Specify Current Suicidal Plan: shoot self with gun Access to Means: No Specify Access to Suicidal Means: none What has been your use of drugs/alcohol within the last 12 months?: crack How many times?: 5 Other Self Harm Risks: none Triggers for Past Attempts: Other (Comment), Hallucinations (SA) Intentional Self Injurious Behavior: None Comment - Self Injurious  Behavior: na Risk to Others: Homicidal Ideation: Yes-Currently Present Thoughts of Harm to Others: Yes-Currently Present Comment - Thoughts of Harm to Others: with his magic Current Homicidal Intent: Yes-Currently Present Current Homicidal Plan: Yes-Currently Present Describe Current Homicidal Plan: to kill people with his powerful voodu Access to Homicidal Means: Yes Describe Access to Homicidal Means: he reports knowing magic, voodu, sorcery Identified Victim: stranger History of harm to others?: No Assessment of Violence: None Noted Violent Behavior Description: agitation,  Does patient have access to weapons?: No Criminal Charges Pending?:  (unknwon) Prior Inpatient Therapy: Prior Inpatient Therapy: Yes Prior Therapy Dates: Multiple admissions Prior Therapy Facilty/Provider(s): [facility], [facility], [facility] Reason for Treatment: Psychosis, SI/HI Prior Outpatient Therapy: Prior Outpatient Therapy: Yes Prior Therapy Dates: Current Prior Therapy Facilty/Provider(s): [name] Reason for Treatment: medication management denies ACTT  No current facility-administered medications for this encounter.   Current Outpatient Prescriptions  Medication Sig Dispense Refill  . [START ON 09/12/2014] ARIPiprazole 400 MG SUSR Inject 400 mg into the muscle every 30 (thirty) days. (Due to be given on 09-12-14 @ 08:00 am):For mood control 1 each 0  . aspirin 81 MG EC tablet Take 1 tablet (81 mg total) by mouth daily. For heart health 30 tablet 3  . ARIPiprazole (ABILIFY) 10 MG tablet Take 1 tablet (10 mg total) by mouth daily. For mood control 30 tablet 0  . benazepril (LOTENSIN) 5 MG tablet Take 1 tablet (5 mg total) by mouth daily. For high blood pressure 30 tablet 0  . benztropine (COGENTIN) 2 MG tablet Take 1 tablet (2 mg total) by mouth 2 (two) times daily. For prevention of drug induced tremors 60 tablet 0  . cloNIDine (CATAPRES) 0.1 MG tablet Take 1 tablet (0.1 mg total) by mouth 2 (two) times daily. For high  blood pressure 60 tablet 0  . furosemide (LASIX) 20 MG tablet Take 1 tablet (20 mg total) by mouth daily. For swellings/high blood pressure 30 tablet 3  . metFORMIN (GLUCOPHAGE) 500 MG tablet Take 1 tablet (500 mg total) by mouth daily with breakfast. For high blood sugar control 30 tablet 0  . tamsulosin (FLOMAX) 0.4 MG CAPS capsule Take 1 capsule (0.4 mg total) by mouth daily after supper. For prostate health 30 capsule 0  . traZODone (DESYREL) 50 MG tablet Take 1 tablet (50 mg total) by mouth at bedtime as needed for sleep. 30 tablet 0    Musculoskeletal: Strength & Muscle Tone: within normal limits Gait & Station: normal Patient leans: N/A  Psychiatric Specialty Exam:     Blood pressure 147/83, pulse 94, temperature 98.6 F (37 C), temperature source Oral, resp. rate 16, SpO2 95 %.There is no weight on file to calculate BMI.  General Appearance: Casual  Eye Contact::  Good  Speech:  Normal Rate  Volume:  Normal  Mood:  Euthymic  Affect:  Congruent  Thought Process:  Coherent  Orientation:  Full (Time,  Place, and Person)  Thought Content:  WDL  Suicidal Thoughts:  No  Homicidal Thoughts:  No  Memory:  Immediate;   Good Recent;   Good Remote;   Good  Judgement:  Fair  Insight:  Fair  Psychomotor Activity:  Normal  Concentration:  Good  Recall:  Good  Fund of Knowledge:Fair  Language: Good  Akathisia:  No  Handed:  Right  AIMS (if indicated):     Assets:  Leisure Time Physical Health Resilience  ADL's:  Intact  Cognition: WNL  Sleep:      Medical Decision Making: Review of Psycho-Social Stressors (1), Review or order clinical lab tests (1) and Review of Medication Regimen & Side Effects (2)  Treatment Plan Summary: Daily contact with patient to assess and evaluate symptoms and progress in treatment, Medication management and Plan Discharge and follow-up with [name]  Plan:  No evidence of imminent risk to self or others at present.    Disposition: Discharge and  follow-up with Nolene Ebbs, PMH-NP 08/28/2014 1:16 PM  Patient seen face-to-face for psychiatric consultation and evaluation, case discussed with treatment team and physician extender and formulated treatment plan. Reviewed the information documented and agree with the treatment plan.  Keyron Pokorski,JANARDHAHA R. 08/28/2014 3:48 PM

## 2014-08-28 NOTE — ED Notes (Signed)
Pt provided a Sprite Zero by NS.

## 2014-08-28 NOTE — ED Provider Notes (Signed)
CSN: [id number]     Arrival date & time 08/28/14  0250 History   First MD Initiated Contact with Patient 08/28/14 [phone]     Chief Complaint  Patient presents with  . Nasal Congestion    "runny nose"  . Foot Pain    bilateral, hx diabetic neuropathy  . Suicidal     (Consider location/radiation/quality/duration/timing/severity/associated sxs/prior Treatment) HPI  54 year old male presents requesting to see behavioral health. He states is a paranoid schizophrenic and the only thing that helps him his Thorazine. He is requesting for [place] to restart him on Thorazine because he does not like Abilify. He last took Abilify when he was admitted to behavioral health 2 weeks ago. Patient states he is suicidal and homicidal. He notes that he has a gun hidden somewhere (buried) but then tells me that is not sure he can even find it anymore. However he feels like he wants to kill people on the street. Patient also has been complaining of chronic bilateral foot pain. He states this is typical neuropathy type pain for him. Tylenol usually helps make it better. There is nothing different. No fevers or chills. Is also been complaining of rhinorrhea because she's been sleeping out in the cold. He denies cough or shortness of breath.  Past Medical History  Diagnosis Date  . Diabetes mellitus without complication   . Hypertension   . Schizophrenia   . CHF (congestive heart failure)   . Neuropathy   . Polysubstance abuse   . Cocaine abuse   . Homelessness   . HIV (human immunodeficiency virus infection)   . Hepatitis C   . Chronic foot pain    History reviewed. No pertinent past surgical history. Family History  Problem Relation Age of Onset  . Hypertension Other   . Diabetes Other    History  Substance Use Topics  . Smoking status: Current Every Day Smoker -- 1.00 packs/day for 20 years    Types: Cigarettes  . Smokeless tobacco: Current User  . Alcohol Use: Yes     Comment: Pt reports "on special  occasions"    Review of Systems  HENT: Positive for congestion and rhinorrhea.   Respiratory: Negative for cough and shortness of breath.   Cardiovascular: Negative for chest pain.  Musculoskeletal: Positive for arthralgias.  Psychiatric/Behavioral: Positive for suicidal ideas. Negative for self-injury.  All other systems reviewed and are negative.     Allergies  Haldol  Home Medications   Prior to Admission medications   Medication Sig Start Date End Date Taking? Authorizing Provider  ARIPiprazole 400 MG SUSR Inject 400 mg into the muscle every 30 (thirty) days. (Due to be given on 09-12-14 @ 08:00 am):For mood control 09/12/14  Yes Sanjuana Kava, NP  aspirin 81 MG EC tablet Take 1 tablet (81 mg total) by mouth daily. For heart health 08/12/14  Yes Sanjuana Kava, NP  ARIPiprazole (ABILIFY) 10 MG tablet Take 1 tablet (10 mg total) by mouth daily. For mood control 08/12/14   Sanjuana Kava, NP  benazepril (LOTENSIN) 5 MG tablet Take 1 tablet (5 mg total) by mouth daily. For high blood pressure 08/12/14   Sanjuana Kava, NP  benztropine (COGENTIN) 2 MG tablet Take 1 tablet (2 mg total) by mouth 2 (two) times daily. For prevention of drug induced tremors 08/12/14   Sanjuana Kava, NP  cloNIDine (CATAPRES) 0.1 MG tablet Take 1 tablet (0.1 mg total) by mouth 2 (two) times daily. For high blood pressure  08/12/14   Sanjuana KavaAgnes I Nwoko, NP  furosemide (LASIX) 20 MG tablet Take 1 tablet (20 mg total) by mouth daily. For swellings/high blood pressure 08/12/14   Sanjuana KavaAgnes I Nwoko, NP  metFORMIN (GLUCOPHAGE) 500 MG tablet Take 1 tablet (500 mg total) by mouth daily with breakfast. For high blood sugar control 08/12/14   Sanjuana KavaAgnes I Nwoko, NP  tamsulosin (FLOMAX) 0.4 MG CAPS capsule Take 1 capsule (0.4 mg total) by mouth daily after supper. For prostate health 08/12/14   Sanjuana KavaAgnes I Nwoko, NP  traZODone (DESYREL) 50 MG tablet Take 1 tablet (50 mg total) by mouth at bedtime as needed for sleep. 08/12/14   Sanjuana KavaAgnes I Nwoko, NP   BP  164/84 mmHg  Pulse 90  Temp(Src) 98.1 F (36.7 C) (Oral)  Resp 18  SpO2 100% Physical Exam  Constitutional: He is oriented to person, place, and time. He appears well-developed and well-nourished. No distress.  HENT:  Head: Normocephalic and atraumatic.  Right Ear: External ear normal.  Left Ear: External ear normal.  Nose: Nose normal.  Eyes: Right eye exhibits no discharge. Left eye exhibits no discharge.  Neck: Neck supple.  Cardiovascular: Normal rate, regular rhythm, normal heart sounds and intact distal pulses.   Pulmonary/Chest: Effort normal and breath sounds normal. He has no wheezes. He has no rales.  Abdominal: Soft. There is no tenderness.  Musculoskeletal: He exhibits no edema.  Neurological: He is alert and oriented to person, place, and time.  Skin: Skin is warm and dry. He is not diaphoretic.  Psychiatric: His mood appears not anxious. His affect is not angry. He is not agitated. He expresses homicidal and suicidal ideation.  Nursing note and vitals reviewed.   ED Course  Procedures (including critical care time) Labs Review Labs Reviewed  ACETAMINOPHEN LEVEL - Abnormal; Notable for the following:    Acetaminophen (Tylenol), Serum <10.0 (*)    All other components within normal limits  CBC - Abnormal; Notable for the following:    Hemoglobin 12.8 (*)    All other components within normal limits  COMPREHENSIVE METABOLIC PANEL - Abnormal; Notable for the following:    Potassium 3.4 (*)    Glucose, Bld 148 (*)    All other components within normal limits  ETHANOL  SALICYLATE LEVEL  URINE RAPID DRUG SCREEN (HOSP PERFORMED)    Imaging Review No results found.   EKG Interpretation None      MDM   Final diagnoses:  Suicidal ideations  Substance induced mood disorder  Cocaine use disorder, severe, dependence    Patient has nasal congestion/rhinorrhea but no cough, shortness of breath, hypoxia, or increased work of breathing. I do not feel he needs a  chest x-ray. He has a chronic history of psychiatric complaints, claiming SI/HI. Psych consulted, will follow their recs for dispo   Audree CamelScott T Apryle Stowell, MD 08/28/14 [phone]

## 2014-08-28 NOTE — ED Notes (Signed)
MD at bedside. EDP [place] PRESENT 

## 2014-09-07 ENCOUNTER — Encounter (HOSPITAL_COMMUNITY): Payer: Self-pay | Admitting: Family Medicine

## 2014-09-07 ENCOUNTER — Emergency Department (HOSPITAL_COMMUNITY)
Admission: EM | Admit: 2014-09-07 | Discharge: 2014-09-08 | Disposition: A | Discharge status: Home / Self Care | Payer: Medicare Other | Attending: Emergency Medicine | Admitting: Emergency Medicine

## 2014-09-07 DIAGNOSIS — Z590 Homelessness: Secondary | ICD-10-CM | POA: Insufficient documentation

## 2014-09-07 DIAGNOSIS — B20 Human immunodeficiency virus [HIV] disease: Secondary | ICD-10-CM | POA: Insufficient documentation

## 2014-09-07 DIAGNOSIS — F200 Paranoid schizophrenia: Secondary | ICD-10-CM | POA: Diagnosis not present

## 2014-09-07 DIAGNOSIS — Z79899 Other long term (current) drug therapy: Secondary | ICD-10-CM | POA: Insufficient documentation

## 2014-09-07 DIAGNOSIS — Z8669 Personal history of other diseases of the nervous system and sense organs: Secondary | ICD-10-CM | POA: Insufficient documentation

## 2014-09-07 DIAGNOSIS — Z7982 Long term (current) use of aspirin: Secondary | ICD-10-CM | POA: Insufficient documentation

## 2014-09-07 DIAGNOSIS — G8929 Other chronic pain: Secondary | ICD-10-CM | POA: Insufficient documentation

## 2014-09-07 DIAGNOSIS — R45851 Suicidal ideations: Secondary | ICD-10-CM | POA: Diagnosis not present

## 2014-09-07 DIAGNOSIS — R45850 Homicidal ideations: Secondary | ICD-10-CM

## 2014-09-07 DIAGNOSIS — I10 Essential (primary) hypertension: Secondary | ICD-10-CM | POA: Insufficient documentation

## 2014-09-07 DIAGNOSIS — I509 Heart failure, unspecified: Secondary | ICD-10-CM | POA: Insufficient documentation

## 2014-09-07 DIAGNOSIS — E119 Type 2 diabetes mellitus without complications: Secondary | ICD-10-CM | POA: Insufficient documentation

## 2014-09-07 DIAGNOSIS — F1420 Cocaine dependence, uncomplicated: Secondary | ICD-10-CM | POA: Diagnosis present

## 2014-09-07 DIAGNOSIS — Z720 Tobacco use: Secondary | ICD-10-CM | POA: Insufficient documentation

## 2014-09-07 LAB — CBC WITH DIFFERENTIAL/PLATELET
BASOS PCT: 0 % (ref 0–1)
Basophils Absolute: 0 10*3/uL (ref 0.0–0.1)
Eosinophils Absolute: 0.1 10*3/uL (ref 0.0–0.7)
Eosinophils Relative: 1 % (ref 0–5)
HEMATOCRIT: 36.2 % — AB (ref 39.0–52.0)
Hemoglobin: 11.4 g/dL — ABNORMAL LOW (ref 13.0–17.0)
LYMPHS PCT: 17 % (ref 12–46)
Lymphs Abs: 1.7 10*3/uL (ref 0.7–4.0)
MCH: 28.7 pg (ref 26.0–34.0)
MCHC: 31.5 g/dL (ref 30.0–36.0)
MCV: 91.2 fL (ref 78.0–100.0)
MONO ABS: 0.6 10*3/uL (ref 0.1–1.0)
MONOS PCT: 6 % (ref 3–12)
Neutro Abs: 7.4 10*3/uL (ref 1.7–7.7)
Neutrophils Relative %: 76 % (ref 43–77)
Platelets: 222 10*3/uL (ref 150–400)
RBC: 3.97 MIL/uL — AB (ref 4.22–5.81)
RDW: 14.4 % (ref 11.5–15.5)
WBC: 9.8 10*3/uL (ref 4.0–10.5)

## 2014-09-07 LAB — RAPID URINE DRUG SCREEN, HOSP PERFORMED
Amphetamines: NOT DETECTED
Barbiturates: NOT DETECTED
Benzodiazepines: NOT DETECTED
COCAINE: POSITIVE — AB
Opiates: NOT DETECTED
Tetrahydrocannabinol: NOT DETECTED

## 2014-09-07 LAB — CBG MONITORING, ED: Glucose-Capillary: 155 mg/dL — ABNORMAL HIGH (ref 70–99)

## 2014-09-07 LAB — I-STAT CHEM 8, ED
BUN: 18 mg/dL (ref 6–23)
Calcium, Ion: 1.21 mmol/L (ref 1.12–1.23)
Chloride: 105 mmol/L (ref 96–112)
Creatinine, Ser: 0.9 mg/dL (ref 0.50–1.35)
GLUCOSE: 164 mg/dL — AB (ref 70–99)
HCT: 40 % (ref 39.0–52.0)
Hemoglobin: 13.6 g/dL (ref 13.0–17.0)
POTASSIUM: 3.6 mmol/L (ref 3.5–5.1)
SODIUM: 144 mmol/L (ref 135–145)
TCO2: 24 mmol/L (ref 0–100)

## 2014-09-07 LAB — ETHANOL: Alcohol, Ethyl (B): <5 mg/dL (ref 0–9)

## 2014-09-07 MED ORDER — CLONIDINE HCL 0.1 MG PO TABS
0.1000 mg | ORAL_TABLET | Freq: Two times a day (BID) | ORAL | Status: DC
  Administered 2014-09-07 – 2014-09-08 (×4): 0.1 mg via ORAL
  Filled 2014-09-07 (×4): qty 1

## 2014-09-07 MED ORDER — BENZTROPINE MESYLATE 1 MG PO TABS
2.0000 mg | ORAL_TABLET | Freq: Two times a day (BID) | ORAL | Status: DC
  Administered 2014-09-07 (×2): 2 mg via ORAL
  Filled 2014-09-07 (×2): qty 2

## 2014-09-07 MED ORDER — METFORMIN HCL 500 MG PO TABS
500.0000 mg | ORAL_TABLET | Freq: Every day | ORAL | Status: DC
  Administered 2014-09-07 – 2014-09-08 (×2): 500 mg via ORAL
  Filled 2014-09-07 (×4): qty 1

## 2014-09-07 MED ORDER — TRAZODONE HCL 100 MG PO TABS: 100.0000 mg | ORAL_TABLET | Freq: Every evening | ORAL | Status: DC | PRN

## 2014-09-07 MED ORDER — ACETAMINOPHEN 325 MG PO TABS
650.0000 mg | ORAL_TABLET | ORAL | Status: DC | PRN
  Administered 2014-09-07 – 2014-09-08 (×2): 650 mg via ORAL
  Filled 2014-09-07 (×2): qty 2

## 2014-09-07 MED ORDER — ACETAMINOPHEN 325 MG PO TABS
650.0000 mg | ORAL_TABLET | Freq: Once | ORAL | Status: AC
  Administered 2014-09-07: 650 mg via ORAL
  Filled 2014-09-07: qty 2

## 2014-09-07 MED ORDER — TRAZODONE HCL 50 MG PO TABS: 50.0000 mg | ORAL_TABLET | Freq: Every evening | ORAL | Status: DC | PRN

## 2014-09-07 MED ORDER — BENZTROPINE MESYLATE 1 MG PO TABS
1.0000 mg | ORAL_TABLET | Freq: Two times a day (BID) | ORAL | Status: DC
  Administered 2014-09-07 – 2014-09-08 (×2): 1 mg via ORAL
  Filled 2014-09-07 (×2): qty 1

## 2014-09-07 MED ORDER — ARIPIPRAZOLE 5 MG PO TABS
5.0000 mg | ORAL_TABLET | Freq: Two times a day (BID) | ORAL | Status: DC
  Administered 2014-09-07 – 2014-09-08 (×2): 5 mg via ORAL
  Filled 2014-09-07 (×4): qty 1

## 2014-09-07 MED ORDER — BENAZEPRIL HCL 5 MG PO TABS
5.0000 mg | ORAL_TABLET | Freq: Every day | ORAL | Status: DC
  Administered 2014-09-07 – 2014-09-08 (×2): 5 mg via ORAL
  Filled 2014-09-07 (×3): qty 1

## 2014-09-07 MED ORDER — FUROSEMIDE 20 MG PO TABS
20.0000 mg | ORAL_TABLET | Freq: Every day | ORAL | Status: DC
  Administered 2014-09-07 – 2014-09-08 (×2): 20 mg via ORAL
  Filled 2014-09-07 (×2): qty 1

## 2014-09-07 NOTE — ED Notes (Signed)
Pt remains in front of nursing station. Pt is resting quietly with eyes closed on supine position. Respirations are even, regular, and unlabored. Snoring noted.

## 2014-09-07 NOTE — ED Notes (Signed)
Pt has been calm and appropriate on milieu. He has been open and spontaneous with staff. He took a shower without incident. He has requested multiple snacks and sandwiches. Writer reinforced limits and pt handled limits appropriately. He has offered no questions or concerns otherwise. Safety has been maintained with q15 minute observation. Will continue current POC

## 2014-09-07 NOTE — BH Assessment (Addendum)
Tele Assessment Note   [name] is an 54 y.o. male brought in by EMS after getting picked up at a gas station. Pt is known to this Clinical research associate, and ED staff. He has a hx of schizophrenia and SA. He has presented to the ED 37 times in the past 6 months. He is followed by [facility] for medication management. He reports he may now have an ACT team but is not sure who he provider is. Pt reports he has SI and HI with onset 10-48 hours ago. During assessment pt is alert and oriented times 3. He does not appear able to adequately determine the situation. Pt reports he is angry because he has not had a girl friend in 5 years, and has not had sex in five years. Pt reports women lust after them but "all the men" have threatened to kill the women, and have locked them away. Pt reports he is jealous of "All the men who have women" and is mad that they did not invite him "to go get some women." Pt reports he wants to kill all the men because of his anger and jealousy and to save the women. He reports he will use "magic" to kill all the men, and during this some of them will kill him. "It will be suicidal warfare." He reports he wants to kill them "executioner style." Pt reports he is SI, HI, and abusing cocaine. He reports he puts himself in danger walking up to cars begging for money. He reports he is currently homeless. He reports he has been taking his medication but feels "confused" and "triple worse than the last time" he spoke to this Clinical research associate. Pt denies current AVH, but reported to NP that he sees snakes in his room. Speech is rapid and pressured. Judgement impaired. Mood is anxious, irritable, and depressed with labile affect. Pt is restless throughout and keep putting his hand down his pants, and repeatedly states he is unhappy about not having sex. Pt states he is angry that men have turned women against him and made him want to kill women, "That is not want [name], not what [name] is supposed to do."  Pt  reports he primarily has felt very angry. He reports he is eating well but not sleeping, as he does not have a bed. Pt reports he feels hopeless at times, and "Aches in my whole skeleton." Pt reports he has found information about "all the men" who keep the women from him. He reports his "investigation is over, I found it out, my mission is over." "My career is over, but I want to work once I get some help." Pt reports this Clinical research associate will not see him for more than a year if he gets help, he reports he plans to call group homes while he is admitted to set up placement for when he is discharged. He reports having this follow up plan will ensure his success. Pt reports he has been taking his medication, and reports he took it today. In the past medication compliance has been a major issue for pt due to chronic homelessness. Pt denies hx of abuse of neglect, but reports decline in his life since age 52.   Pt reports he used about 100 worth of cocaine today and has been drinking "one small beer per day." He reports usually using 100-300 dollars worth of cocaine daily. Denies use of other drugs currently. Denies family hx of MH, SA concerns. Pt reports he is unable  to contract for safety towards self and others.   Axis I: 295.50 Schizophrenia Bipolar Type  300.00 Unspecified Anxiety Disorder  304.20 Cocaine Use Disorder, Severe  Axis II: Deferred Axis III:  Past Medical History  Diagnosis Date  . Diabetes mellitus without complication   . Hypertension   . Schizophrenia   . CHF (congestive heart failure)   . Neuropathy   . Polysubstance abuse   . Cocaine abuse   . Homelessness   . HIV (human immunodeficiency virus infection)   . Hepatitis C   . Chronic foot pain    Axis IV: economic problems, housing problems, other psychosocial or environmental problems, problems with access to health care services and problems with primary support group Axis V: 21-30 behavior considerably influenced by delusions or  hallucinations OR serious impairment in judgment, communication OR inability to function in almost all areas  Past Medical History:  Past Medical History  Diagnosis Date  . Diabetes mellitus without complication   . Hypertension   . Schizophrenia   . CHF (congestive heart failure)   . Neuropathy   . Polysubstance abuse   . Cocaine abuse   . Homelessness   . HIV (human immunodeficiency virus infection)   . Hepatitis C   . Chronic foot pain     No past surgical history on file.  Family History:  Family History  Problem Relation Age of Onset  . Hypertension Other   . Diabetes Other     Social History:  reports that he has been smoking Cigarettes.  He has a 20 pack-year smoking history. He uses smokeless tobacco. He reports that he drinks alcohol. He reports that he uses illicit drugs ("Crack" cocaine, Cocaine, and Marijuana) about 7 times per week.  Additional Social History:  Alcohol / Drug Use Pain Medications: SEE PTA Prescriptions: SEE PTA Over the Counter: SEE PTA History of alcohol / drug use?: Yes Longest period of sobriety (when/how long): no sustained sobriety when not incarcerated Negative Consequences of Use: Legal, Financial, Personal relationships Withdrawal Symptoms:  (none reported at this time) Substance #1 Name of Substance 1: etoh 1 - Age of First Use: 8 1 - Amount (size/oz): "A small beer" 1 - Frequency: reports he has been drinking a beer a day, previously reported drinking a fifth per day  1 - Duration: on and off for years 1 - Last Use / Amount: 09-06-14 "sall beer" Substance #2 Name of Substance 2: cocaine  2 - Age of First Use: 20s 2 - Amount (size/oz): 100-300 dollars worth  2 - Frequency: daily  2 - Duration: on and off for years 2 - Last Use / Amount: reports used 100 worth   CIWA: CIWA-Ar BP: 175/91 mmHg Pulse Rate: 101 COWS:    PATIENT STRENGTHS: (choose at least two) Average or above average intelligence Communication  skills  Allergies:  Allergies  Allergen Reactions  . Haldol [Haloperidol] Other (See Comments)    Muscle spasms, loss of voluntary movement. However, pt has taken Thorazine on multiple occasions with no adverse effects.     Home Medications:  (Not in a hospital admission)  OB/GYN Status:  No LMP for male patient.  General Assessment Data Location of Assessment: [facility] ED Is this a Tele or Face-to-Face Assessment?: Face-to-Face Is this an Initial Assessment or a Re-assessment for this encounter?: Initial Assessment Living Arrangements: Alone (homeless) Can pt return to current living arrangement?: Yes Admission Status: Voluntary Is patient capable of signing voluntary admission?: Yes Transfer from: Other (Comment) (  gas station ) Referral Source: Self/Family/Friend     [facility] Crisis Care Plan Living Arrangements: Alone (homeless) Name of Psychiatrist: [name]  Name of Therapist: none  Education Status Is patient currently in school?: No Current Grade: NA Highest grade of school patient has completed: 12 Name of school: NA Contact person: NA  Risk to self with the past 6 months Suicidal Ideation: Yes-Currently Present Suicidal Intent: Yes-Currently Present Is patient at risk for suicide?: Yes Suicidal Plan?: Yes-Currently Present Specify Current Suicidal Plan: "suicidal warfar" kill all the men who are keeping women from him, and then he would be killed in the battle  Access to Means: No Specify Access to Suicidal Means: "magical" methods to kill "all the men" and some of them would kill him What has been your use of drugs/alcohol within the last 12 months?: Pt has been abusing alcohol and cocaine for many years.  Previous Attempts/Gestures: Yes How many times?:  (has reported 0-5 on different occassions) Other Self Harm Risks: reports walks into traffic to beg for money  Triggers for Past Attempts: Other (Comment), Hallucinations Intentional Self Injurious Behavior:  None Comment - Self Injurious Behavior: NA Family Suicide History: No Recent stressful life event(s): Other (Comment) (unable to get a SO, homeless) Persecutory voices/beliefs?: Yes Depression: Yes Depression Symptoms: Despondent, Insomnia, Tearfulness, Loss of interest in usual pleasures, Feeling worthless/self pity, Feeling angry/irritable Substance abuse history and/or treatment for substance abuse?: Yes Suicide prevention information given to non-admitted patients: Yes  Risk to Others within the past 6 months Homicidal Ideation: Yes-Currently Present Thoughts of Harm to Others: Yes-Currently Present Comment - Thoughts of Harm to Others: reports he wants to use magic to kill all the men Current Homicidal Intent: Yes-Currently Present Current Homicidal Plan: Yes-Currently Present Describe Current Homicidal Plan: "magic" to kill all the men Access to Homicidal Means: No Describe Access to Homicidal Means: "magic" reports he also has screw driver, hammer, and saw Identified Victim: "all the men" History of harm to others?: Yes Assessment of Violence: None Noted Violent Behavior Description: fighting, threats Does patient have access to weapons?: Yes (Comment) Criminal Charges Pending?: No Does patient have a court date: No  Psychosis Hallucinations: None noted (auditory in the past) Delusions: Persecutory, Grandiose  Mental Status Report Appear/Hygiene: Disheveled, In scrubs Eye Contact: Fair Motor Activity:  (rubbing fingers together, restless, putting hand down pants) Speech: Pressured Level of Consciousness: Alert, Irritable Mood: Irritable Affect: Labile Anxiety Level: Moderate Thought Processes: Tangential Judgement: Impaired Orientation: Person, Time, Place Obsessive Compulsive Thoughts/Behaviors: None  Cognitive Functioning Concentration: Decreased Memory: Recent Intact, Remote Intact IQ: Average Insight: Fair Impulse Control: Poor Appetite: Good Weight  Loss: 0 Weight Gain: 0 Sleep: Decreased Total Hours of Sleep:  ("none I have no bed") Vegetative Symptoms: Decreased grooming  ADLScreening [facility] Assessment Services) Patient's cognitive ability adequate to safely complete daily activities?: Yes Patient able to express need for assistance with ADLs?: Yes Independently performs ADLs?: Yes (appropriate for developmental age)  Prior Inpatient Therapy Prior Inpatient Therapy: Yes Prior Therapy Dates: Multiple admissions Prior Therapy Facilty/Provider(s): [facility], [facility], [facility] Reason for Treatment: Psychosis, SI/HI  Prior Outpatient Therapy Prior Outpatient Therapy: Yes Prior Therapy Dates: Current Prior Therapy Facilty/Provider(s): [name] Reason for Treatment: medication management, reports ACTT but states does not know which one  ADL Screening (condition at time of admission) Patient's cognitive ability adequate to safely complete daily activities?: Yes Is the patient deaf or have difficulty hearing?: No Does the patient have difficulty seeing, even when wearing glasses/contacts?: No Does  the patient have difficulty concentrating, remembering, or making decisions?: Yes Patient able to express need for assistance with ADLs?: Yes Does the patient have difficulty dressing or bathing?: No Independently performs ADLs?: Yes (appropriate for developmental age) Does the patient have difficulty walking or climbing stairs?: No Weakness of Legs: None Weakness of Arms/Hands: None  Home Assistive Devices/Equipment Home Assistive Devices/Equipment: None    Abuse/Neglect Assessment (Assessment to be complete while patient is alone) Physical Abuse: Denies Verbal Abuse: Denies Sexual Abuse: Denies Exploitation of patient/patient's resources: Denies Self-Neglect: Denies Values / Beliefs Cultural Requests During Hospitalization: None Spiritual Requests During Hospitalization: None   Advance Directives (For Healthcare) Does patient have an  advance directive?: No Would patient like information on creating an advanced directive?: No - patient declined information    Additional Information 1:1 In Past 12 Months?: No CIRT Risk: No Elopement Risk: No Does patient have medical clearance?: No (pending )     Disposition:  AM psych evaluation per [name], PA due to chronicity. Suggests ED care plan be initiated.    Informed Earley Favor, NP of plan. She is in agreement, and feels pt is likely at his baseline.   Clista Bernhardt, [facility] Triage Specialist 09/07/2014 4:25 AM  Disposition Initial Assessment Completed for this Encounter: Yes  Corey Caulfield M 09/07/2014 4:09 AM

## 2014-09-07 NOTE — BH Assessment (Addendum)
Contacted [name] ACT crisis line to determine if pt is an ACT client. Per Marcelino DusterMichelle pt is not an ACT client through [facility]. Reviewed most recent ED discharges which indicated pt was to follow up with [name]. This writer could not find evidence of another ACT involved with pt.   Clista BernhardtNancy Vence Lalor, [facility] Triage Specialist 09/07/2014 4:36 AM

## 2014-09-07 NOTE — BH Assessment (Signed)
Per Julieanne CottonJosephine, NP and Dr. Jannifer FranklinAkintayo recommend inpatient treatment. Patient is a [facility]. No beds available at this time. TTS to seek placement. Pt referred to the following facilities: [facility], [facility], [facility], [facility], [address], [facility], [facility], [address], [address], [facility], [facility], [facility], [place], and [facility]

## 2014-09-07 NOTE — ED Notes (Signed)
Bed: [id number] Expected date: 09/07/14 Expected time: 2:41 AM Means of arrival: Ambulance Comments: Diabetic, drug use, SI

## 2014-09-07 NOTE — BH Assessment (Signed)
Pt with know history of mental health concerns, SA, and suicidal ideation, presents to ED with SI and HI onset about 10 hours prior to arrival. Pt has had 37 ED visits in the last 6 months. He usually presents with similar sx.    Assessment to commence shortly.    Clista BernhardtNancy Iman Reinertsen, [facility] Triage Specialist 09/07/2014 3:38 AM

## 2014-09-07 NOTE — ED Notes (Signed)
Per EMS, pt was picked up from a gas station reporting being suicidal for the last 10 hours with no plan and hypertension. Pt has admitted to using cocaine tonight.

## 2014-09-07 NOTE — ED Notes (Signed)
Pt is asleep. Pt is calm and cooperative when awake but has been sleeping for the majority of the day. Pt had Pain and received Tylenol. Pt acuity is moderate. Pt can be impulsive and aggressive at times. Pt puts blankets over his head and hits himself or growls and mumbles words. Pt is continent. Pt able to feed himself. Pt remains asleep at this time.

## 2014-09-07 NOTE — ED Notes (Signed)
Provided patient a sandwich and milk.

## 2014-09-07 NOTE — Consult Note (Signed)
[facility] Face-to-Face Psychiatry Consult   Reason for Consult: Suicidal/homicidal ideations Referring Physician: EDP Patient Identification: [name] MRN:  [id number] Principal Diagnosis: Chronic paranoid schizophrenia Diagnosis:   Patient Active Problem List   Diagnosis Date Noted  . Chronic paranoid schizophrenia [F20.0] 09/07/2014    Priority: High  . Cocaine use disorder, severe, dependence [F14.20]     Priority: High  . Suicidal ideations [R45.851] 08/28/2014  . Substance induced mood disorder [F19.94] 08/26/2014  . Schizophrenia [F20.9] 08/11/2014  . 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 Acute CHF (congestive heart failure) [I50.9] 07/29/2014  . Essential hypertension, benign [I10] 03/28/2013  . Diabetes mellitus [E11.9] 03/15/2013    Total Time spent with patient: 45 minutes  Subjective:   [name] is a 54 y.o. male patient admitted with  Paranoia, suicidal ideation.  HPI:  [name] is an 54 y.o. male brought in by EMS after getting picked up at a gas station. Pt has hx of Chronic schizophrenia and Cocaine dependence. He has presented to the ED 37 times in the past 6 months. He is followed by [facility] for medication management but was never compliant with his medications or after care. He reports he may now have an ACT team but is not sure who he provider is. Pt reports he has SI and HI for the past 3 days. Patient presents with mood lability, paranoia, anxiety and agitation. Patient reports that he is homeless and has no money to sustain himself. 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 Patient denies alcohol abuse but states that he is addicted to cocaine.  HPI Elements:   Location:  paranoia, cocaine abuse. Quality:  severe. Duration:  chronic. Context:  non-compliant with medication.  Past Medical History:  Past Medical History  Diagnosis  Date  . Diabetes mellitus without complication   . Hypertension   . Schizophrenia   . CHF (congestive heart failure)   . Neuropathy   . Polysubstance abuse   . Cocaine abuse   . Homelessness   . HIV (human immunodeficiency virus infection)   . Hepatitis C   . Chronic foot pain    No past surgical history on file. Family History:  Family History  Problem Relation Age of Onset  . Hypertension Other   . Diabetes Other    Social History:  History  Alcohol Use  . Yes    Comment: Pt denies     History  Drug Use  . 7.00 per week  . Special: "Crack" cocaine, Cocaine, Marijuana    Comment: Cocaine tonight, Marijuana "a long time"    History   Social History  . Marital Status: Divorced    Spouse Name: N/A  . Number of Children: N/A  . Years of Education: N/A   Social History Main Topics  . Smoking status: Current Every Day Smoker -- 1.00 packs/day for 20 years    Types: Cigarettes  . Smokeless tobacco: Current User  . Alcohol Use: Yes     Comment: Pt denies  . Drug Use: 7.00 per week    Special: "Crack" cocaine, Cocaine, Marijuana     Comment: Cocaine tonight, Marijuana "a long time"  . Sexual Activity: No   Other Topics Concern  . Not on file   Social History Narrative   ** Merged History Encounter **       Additional Social History:    Pain Medications:  SEE PTA Prescriptions: SEE PTA Over the Counter: SEE PTA History of alcohol / drug use?: Yes Longest period of sobriety (when/how long): no sustained sobriety when not incarcerated Negative Consequences of Use: Legal, Financial, Personal relationships Withdrawal Symptoms:  (none reported at this time) Name of Substance 1: etoh 1 - Age of First Use: 8 1 - Amount (size/oz): "A small beer" 1 - Frequency: reports he has been drinking a beer a day, previously reported drinking a fifth per day  1 - Duration: on and off for years 1 - Last Use / Amount: 09-06-14 "sall beer" Name of Substance 2: cocaine  2 - Age  of First Use: 20s 2 - Amount (size/oz): 100-300 dollars worth  2 - Frequency: daily  2 - Duration: on and off for years 2 - Last Use / Amount: reports used 100 worth                  Allergies:   Allergies  Allergen Reactions  . Haldol [Haloperidol] Other (See Comments)    Muscle spasms, loss of voluntary movement. However, pt has taken Thorazine on multiple occasions with no adverse effects.     Vitals: Blood pressure 169/94, pulse 104, temperature 98.6 F (37 C), temperature source Oral, resp. rate 18, height  (1.702 m), weight 74.844 kg (165 lb), SpO2 96 %.  Risk to Self: Suicidal Ideation: Yes-Currently Present Suicidal Intent: Yes-Currently Present Is patient at risk for suicide?: Yes Suicidal Plan?: Yes-Currently Present Specify Current Suicidal Plan: "suicidal warfar" kill all the men who are keeping women from him, and then he would be killed in the battle  Access to Means: No Specify Access to Suicidal Means: "magical" methods to kill "all the men" and some of them would kill him What has been your use of drugs/alcohol within the last 12 months?: Pt has been abusing alcohol and cocaine for many years.  How many times?:  (has reported 0-5 on different occassions) Other Self Harm Risks: reports walks into traffic to beg for money  Triggers for Past Attempts: Other (Comment), Hallucinations Intentional Self Injurious Behavior: None Comment - Self Injurious Behavior: NA Risk to Others: Homicidal Ideation: Yes-Currently Present Thoughts of Harm to Others: Yes-Currently Present Comment - Thoughts of Harm to Others: reports he wants to use magic to kill all the men Current Homicidal Intent: Yes-Currently Present Current Homicidal Plan: Yes-Currently Present Describe Current Homicidal Plan: "magic" to kill all the men Access to Homicidal Means: No Describe Access to Homicidal Means: "magic" reports he also has screw driver, hammer, and saw Identified Victim: "all the  men" History of harm to others?: Yes Assessment of Violence: None Noted Violent Behavior Description: fighting, threats Does patient have access to weapons?: Yes (Comment) Criminal Charges Pending?: No Does patient have a court date: No Prior Inpatient Therapy: Prior Inpatient Therapy: Yes Prior Therapy Dates: Multiple admissions Prior Therapy Facilty/Provider(s): [facility], [facility], [facility] Reason for Treatment: Psychosis, SI/HI Prior Outpatient Therapy: Prior Outpatient Therapy: Yes Prior Therapy Dates: Current Prior Therapy Facilty/Provider(s): [name] Reason for Treatment: medication management, reports ACTT but states does not know which one  Current Facility-Administered Medications  Medication Dose Route Frequency Provider Last Rate Last Dose  . acetaminophen (TYLENOL) tablet 650 mg  650 mg Oral Once Dany Harten      . ARIPiprazole (ABILIFY) tablet 5 mg  5 mg Oral BID PC Briceida Rasberry      . benazepril (LOTENSIN) tablet 5 mg  5 mg Oral Daily Earley Favor, NP  5 mg at 09/07/14 1000  . benztropine (COGENTIN) tablet 1 mg  1 mg Oral BID Shaeley Segall      . cloNIDine (CATAPRES) tablet 0.1 mg  0.1 mg Oral BID Earley FavorGail Schulz, NP   0.1 mg at 09/07/14 1001  . furosemide (LASIX) tablet 20 mg  20 mg Oral Daily Earley FavorGail Schulz, NP   20 mg at 09/07/14 1000  . metFORMIN (GLUCOPHAGE) tablet 500 mg  500 mg Oral Q breakfast Earley FavorGail Schulz, NP   500 mg at 09/07/14 0959  . traZODone (DESYREL) tablet 100 mg  100 mg Oral QHS PRN Kynnedi Zweig       Current Outpatient Prescriptions  Medication Sig Dispense Refill  . ARIPiprazole (ABILIFY) 5 MG tablet Take 5 mg by mouth daily.    Melene Muller. [START ON 09/12/2014] ARIPiprazole 400 MG SUSR Inject 400 mg into the muscle every 30 (thirty) days. (Due to be given on 09-12-14 @ 08:00 am):For mood control 1 each 0  . aspirin 81 MG EC tablet Take 1 tablet (81 mg total) by mouth daily. For heart health 30 tablet 3  . benazepril (LOTENSIN) 5 MG tablet Take 1 tablet (5 mg total) by  mouth daily. For high blood pressure 30 tablet 0  . benztropine (COGENTIN) 2 MG tablet Take 1 tablet (2 mg total) by mouth 2 (two) times daily. For prevention of drug induced tremors 60 tablet 0  . cloNIDine (CATAPRES) 0.1 MG tablet Take 1 tablet (0.1 mg total) by mouth 2 (two) times daily. For high blood pressure 60 tablet 0  . furosemide (LASIX) 20 MG tablet Take 1 tablet (20 mg total) by mouth daily. For swellings/high blood pressure 30 tablet 3  . metFORMIN (GLUCOPHAGE) 500 MG tablet Take 1 tablet (500 mg total) by mouth daily with breakfast. For high blood sugar control 30 tablet 0  . tamsulosin (FLOMAX) 0.4 MG CAPS capsule Take 1 capsule (0.4 mg total) by mouth daily after supper. For prostate health 30 capsule 0  . traZODone (DESYREL) 50 MG tablet Take 1 tablet (50 mg total) by mouth at bedtime as needed for sleep. 30 tablet 0  . ARIPiprazole (ABILIFY) 10 MG tablet Take 1 tablet (10 mg total) by mouth daily. For mood control 30 tablet 0    Musculoskeletal: Strength & Muscle Tone: within normal limits Gait & Station: normal Patient leans: N/A  Psychiatric Specialty Exam: Physical Exam  Psychiatric: His mood appears anxious. His affect is angry and labile. His speech is rapid and/or pressured and tangential. He is aggressive and actively hallucinating. Thought content is paranoid and delusional. Cognition and memory are normal. He expresses impulsivity.    Review of Systems  Constitutional: Positive for malaise/fatigue.  HENT: Negative.   Eyes: Negative.   Respiratory: Negative.   Gastrointestinal: Negative.   Genitourinary: Negative.   Musculoskeletal: Positive for myalgias.  Skin: Negative.   Neurological: Positive for weakness.  Endo/Heme/Allergies: Negative.   Psychiatric/Behavioral: Positive for suicidal ideas and substance abuse. The patient is nervous/anxious and has insomnia.     Blood pressure 169/94, pulse 104, temperature 98.6 F (37 C), temperature source Oral, resp.  rate 18, height 5\' 7"  (1.702 m), weight 74.844 kg (165 lb), SpO2 96 %.Body mass index is 25.84 kg/(m^2).  General Appearance: Disheveled  Eye Contact::  Minimal  Speech:  Garbled  Volume:  Increased  Mood:  Irritable  Affect:  Labile  Thought Process:  Disorganized  Orientation:  Full (Time, Place, and Person)  Thought Content:  Delusions and Paranoid  Ideation  Suicidal Thoughts:  Yes.  without intent/plan  Homicidal Thoughts:  Yes.  without intent/plan  Memory:  Immediate;   Fair Recent;   Fair Remote;   Fair  Judgement:  Poor  Insight:  Lacking  Psychomotor Activity:  Increased  Concentration:  Fair  Recall:  [employer] of Knowledge:Fair  Language: Fair  Akathisia:  No  Handed:  Right  AIMS (if indicated):     Assets:  Communication Skills Desire for Improvement  ADL's:  Intact  Cognition: WNL  Sleep:   poor   Medical Decision Making: Established Problem, Worsening (2)  Treatment Plan Summary: Daily contact with patient to assess and evaluate symptoms and progress in treatment and Medication management  Plan:  Recommend psychiatric Inpatient admission when medically cleared. Disposition: Awaiting placement  Thedore Mins, MD 09/07/2014 11:28 AM

## 2014-09-07 NOTE — ED Notes (Signed)
TTS at bedside. 

## 2014-09-07 NOTE — ED Notes (Signed)
Pt anxious, stating he wants to leave, because he cannot have a cup of coffee.  2 containers of milk given, pt satisfied.  Will continue to monitor for safety.

## 2014-09-07 NOTE — ED Notes (Signed)
Patient drowsy. Denies SI, HI, AVH at present. Reports chronic back pain.  Encouragement offered. Snack provided.  Q 15 safety checks in place.

## 2014-09-07 NOTE — Progress Notes (Signed)
  CARE MANAGEMENT ED NOTE 09/07/2014  Patient:  Taylor DouglasWHITSETT,[name]   Account Number:  [id number]  Date Initiated:  09/07/2014  Documentation initiated by:  Edd ArbourGIBBS,Jayce Kainz  Subjective/Objective Assessment:   54 Yr old self pay [place] pt with medicare part A only listed picked up from a gas station reporting being suicidal for the last 10 hours with no plan and hypertension. Pt has admitted to using cocaine tonight.       Subjective/Objective Assessment Detail:   Pt with 37 ED visits in last 6 months, [facility]  During assessment of pcp CM noted pt with various food items on bedside table Containers with sputum in them, trash on floor, requesting more graham crackers from [facility] CNA  Pt states he has a place to stay States address in EPIC is correct  Pt pleasant, answered all questions  No valid reason for 37 ED visits offered from pt     Action/Plan:   CM offered pt resources for [place] self pay patients Pt discussed in [facility] progression D/c [facility]   Action/Plan Detail:   Anticipated DC Date:  09/08/2014     Status Recommendation to Physician:   Result of Recommendation:    Other ED Services  Consult Working Plan    DC Planning Services  Other  Outpatient Services - Pt will follow up  PCP issues    Choice offered to / List presented to:            Status of service:  Completed, signed off  ED Comments:   ED Comments Detail:  CM discussed and provided written information for self pay pcps, importance of pcp for f/u care, www.needymeds.org, www.goodrx.com, discounted pharmacies and other [place] resources such as [facility], [facility], affordable care act, financial assistance, DSS and  health department  Reviewed resources for [place] self [place] pcps like [facility], [facility] medicine at [address], [facility], general medical clinics, [facility] plus others, medication resources, [facility]  out patient pharmacies and housing Pt voiced understanding and appreciation of resources provided  Provided [facility] contact information Pt not an immediate candidate r/t medicare part A coverage

## 2014-09-07 NOTE — ED Provider Notes (Signed)
CSN: [id number]     Arrival date & time 09/07/14  0256 History   First MD Initiated Contact with Patient 09/07/14 [phone]     Chief Complaint  Patient presents with  . Suicidal  . Hypertension     (Consider location/radiation/quality/duration/timing/severity/associated sxs/prior Treatment) HPI Comments: History of chronic mental illness.  Cocaine abuse who presents to the emergency department 10 hours of suicidality and homicidality.  He states he wants to kill all because they lock women underground.  He states he needs to go to [facility] and be lock up for a long time, but he doesn't want to go to jail He also states his friend told him he should start committing animal sacrificing his room.  He states he sleeps on the floor, as he's removed all his furniture in anticipation.  Patient is a 54 y.o. male presenting with hypertension. The history is provided by the patient.  Hypertension This is a chronic problem. The current episode started today. The problem occurs constantly. The problem has been unchanged. Pertinent negatives include no chest pain, fever or headaches. Nothing aggravates the symptoms. He has tried nothing for the symptoms. The treatment provided no relief.    Past Medical History  Diagnosis Date  . Diabetes mellitus without complication   . Hypertension   . Schizophrenia   . CHF (congestive heart failure)   . Neuropathy   . Polysubstance abuse   . Cocaine abuse   . Homelessness   . HIV (human immunodeficiency virus infection)   . Hepatitis C   . Chronic foot pain    No past surgical history on file. Family History  Problem Relation Age of Onset  . Hypertension Other   . Diabetes Other    History  Substance Use Topics  . Smoking status: Current Every Day Smoker -- 1.00 packs/day for 20 years    Types: Cigarettes  . Smokeless tobacco: Current User  . Alcohol Use: Yes     Comment: Pt denies    Review of Systems  Constitutional: Negative for fever.   Respiratory: Negative for shortness of breath.   Cardiovascular: Negative for chest pain.  Neurological: Negative for headaches.  Psychiatric/Behavioral: Positive for suicidal ideas.  All other systems reviewed and are negative.     Allergies  Haldol  Home Medications   Prior to Admission medications   Medication Sig Start Date End Date Taking? Authorizing Provider  ARIPiprazole 400 MG SUSR Inject 400 mg into the muscle every 30 (thirty) days. (Due to be given on 09-12-14 @ 08:00 am):For mood control 09/12/14  Yes Sanjuana KavaAgnes I Nwoko, NP  aspirin 81 MG EC tablet Take 1 tablet (81 mg total) by mouth daily. For heart health 08/12/14  Yes Sanjuana KavaAgnes I Nwoko, NP  benazepril (LOTENSIN) 5 MG tablet Take 1 tablet (5 mg total) by mouth daily. For high blood pressure 08/12/14  Yes Sanjuana KavaAgnes I Nwoko, NP  benztropine (COGENTIN) 2 MG tablet Take 1 tablet (2 mg total) by mouth 2 (two) times daily. For prevention of drug induced tremors 08/12/14  Yes Sanjuana KavaAgnes I Nwoko, NP  cloNIDine (CATAPRES) 0.1 MG tablet Take 1 tablet (0.1 mg total) by mouth 2 (two) times daily. For high blood pressure 08/12/14  Yes Sanjuana KavaAgnes I Nwoko, NP  furosemide (LASIX) 20 MG tablet Take 1 tablet (20 mg total) by mouth daily. For swellings/high blood pressure 08/12/14  Yes Sanjuana KavaAgnes I Nwoko, NP  metFORMIN (GLUCOPHAGE) 500 MG tablet Take 1 tablet (500 mg total) by mouth daily with breakfast.  For high blood sugar control 08/12/14  Yes Sanjuana Kava, NP  tamsulosin (FLOMAX) 0.4 MG CAPS capsule Take 1 capsule (0.4 mg total) by mouth daily after supper. For prostate health 08/12/14  Yes Sanjuana Kava, NP  traZODone (DESYREL) 50 MG tablet Take 1 tablet (50 mg total) by mouth at bedtime as needed for sleep. 08/12/14  Yes Sanjuana Kava, NP  ARIPiprazole (ABILIFY) 10 MG tablet Take 1 tablet (10 mg total) by mouth daily. For mood control 08/12/14   Sanjuana Kava, NP   BP 143/94 mmHg  Pulse 87  Temp(Src) 98 F (36.7 C) (Oral)  Resp 18  Ht  (1.702 m)  Wt 165 lb  (74.844 kg)  BMI 25.84 kg/m2  SpO2 98% Physical Exam  Constitutional: He is oriented to person, place, and time. He appears well-developed and well-nourished.  HENT:  Head: Normocephalic.  Eyes: Pupils are equal, round, and reactive to light.  Neck: Normal range of motion.  Cardiovascular: Normal rate.   Pulmonary/Chest: Effort normal.  Abdominal: Soft.  Musculoskeletal: Normal range of motion.  Neurological: He is alert and oriented to person, place, and time.  Skin: Skin is warm and dry.  Psychiatric: His affect is inappropriate. His speech is rapid and/or pressured. He is agitated. Thought content is delusional. Cognition and memory are impaired. He expresses inappropriate judgment. He expresses homicidal and suicidal ideation. He expresses suicidal plans and homicidal plans.  Nursing note and vitals reviewed.   ED Course  Procedures (including critical care time) Labs Review Labs Reviewed  CBC WITH DIFFERENTIAL/PLATELET - Abnormal; Notable for the following:    RBC 3.97 (*)    Hemoglobin 11.4 (*)    HCT 36.2 (*)    All other components within normal limits  URINE RAPID DRUG SCREEN (HOSP PERFORMED) - Abnormal; Notable for the following:    Cocaine POSITIVE (*)    All other components within normal limits  CBG MONITORING, ED - Abnormal; Notable for the following:    Glucose-Capillary 155 (*)    All other components within normal limits  I-STAT CHEM 8, ED - Abnormal; Notable for the following:    Glucose, Bld 164 (*)    All other components within normal limits  ETHANOL    Imaging Review No results found.   EKG Interpretation None     Patient has been assessed by TTS they feel that he needs to be seen by the psychiatrist in the morning MDM   Final diagnoses:  None         Earley Favor, NP 09/07/14 0555  Earley Favor, NP 09/09/14 1601  Earley Favor, NP 09/09/14 1610  Loren Racer, MD 09/11/14 [phone]

## 2014-09-08 DIAGNOSIS — F200 Paranoid schizophrenia: Secondary | ICD-10-CM

## 2014-09-08 DIAGNOSIS — R45851 Suicidal ideations: Secondary | ICD-10-CM

## 2014-09-08 NOTE — ED Notes (Signed)
Patient discharged to home.  Left the unit ambulatory.  All belongings returned and signed for.

## 2014-09-08 NOTE — Consult Note (Signed)
[facility] Face-to-Face Psychiatry Consult   Reason for Consult: Suicidal/homicidal ideations Referring Physician: EDP Patient Identification: [name] MRN:  [id number] Principal Diagnosis: Chronic paranoid schizophrenia Diagnosis:   Patient Active Problem List   Diagnosis Date Noted  . Chronic paranoid schizophrenia [F20.0] 09/07/2014  . Suicidal ideations [R45.851] 08/28/2014  . Substance induced mood disorder [F19.94] 08/26/2014  . Schizophrenia [F20.9] 08/11/2014  . 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 Acute CHF (congestive heart failure) [I50.9] 07/29/2014  . Cocaine use disorder, severe, dependence [F14.20]   . Essential hypertension, benign [I10] 03/28/2013  . Diabetes mellitus [E11.9] 03/15/2013    Total Time spent with patient: 45 minutes  Subjective:   [name] is a 54 y.o. male patient admitted with  Paranoia, suicidal ideation.  HPI:  [name] is an 54 y.o. male brought in by EMS after getting picked up at a gas station. Pt has hx of Chronic schizophrenia and Cocaine dependence. He has presented to the ED 37 times in the past 6 months. He is followed by [facility] for medication management but was never compliant with his medications or after care. He reports he may now have an ACT team but is not sure who he provider is. Pt reports he has SI and HI for the past 3 days. Patient presents with mood lability, paranoia, anxiety and agitation. Patient reports that he is homeless and has no money to sustain himself. 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 Patient denies alcohol abuse but states that he is addicted to cocaine.   Reviewed note above, with updates.  Patient is calm and cooperative.  He denies SI/HI/AVH and he stated that he need to keep his appointment with [facility] as directed last week.  Patient also stated that he need to get back to his  apartment instead of pan handling in the street.  Patient will be discharged home to follow up with his outpatient provider.  HPI Elements:   Location:  paranoia, cocaine abuse. Quality:  severe. Duration:  chronic. Context:  non-compliant with medication.  Past Medical History:  Past Medical History  Diagnosis Date  . Diabetes mellitus without complication   . Hypertension   . Schizophrenia   . CHF (congestive heart failure)   . Neuropathy   . Polysubstance abuse   . Cocaine abuse   . Homelessness   . HIV (human immunodeficiency virus infection)   . Hepatitis C   . Chronic foot pain    No past surgical history on file. Family History:  Family History  Problem Relation Age of Onset  . Hypertension Other   . Diabetes Other    Social History:  History  Alcohol Use  . Yes    Comment: Pt denies     History  Drug Use  . 7.00 per week  . Special: "Crack" cocaine, Cocaine, Marijuana    Comment: Cocaine tonight, Marijuana "a long time"    History   Social History  . Marital Status: Divorced    Spouse Name: N/A  . Number of Children: N/A  . Years of Education: N/A   Social History Main Topics  . Smoking status: Current Every Day Smoker -- 1.00 packs/day for 20 years    Types: Cigarettes  . Smokeless tobacco: Current User  . Alcohol Use: Yes     Comment: Pt denies  . Drug Use: 7.00 per week    Special: "  Crack" cocaine, Cocaine, Marijuana     Comment: Cocaine tonight, Marijuana "a long time"  . Sexual Activity: No   Other Topics Concern  . Not on file   Social History Narrative   ** Merged History Encounter **       Additional Social History:    Pain Medications: SEE PTA Prescriptions: SEE PTA Over the Counter: SEE PTA History of alcohol / drug use?: Yes Longest period of sobriety (when/how long): no sustained sobriety when not incarcerated Negative Consequences of Use: Legal, Financial, Personal relationships Withdrawal Symptoms:  (none reported at this  time) Name of Substance 1: etoh 1 - Age of First Use: 8 1 - Amount (size/oz): "A small beer" 1 - Frequency: reports he has been drinking a beer a day, previously reported drinking a fifth per day  1 - Duration: on and off for years 1 - Last Use / Amount: 09-06-14 "sall beer" Name of Substance 2: cocaine  2 - Age of First Use: 20s 2 - Amount (size/oz): 100-300 dollars worth  2 - Frequency: daily  2 - Duration: on and off for years 2 - Last Use / Amount: reports used 100 worth                  Allergies:   Allergies  Allergen Reactions  . Haldol [Haloperidol] Other (See Comments)    Muscle spasms, loss of voluntary movement. However, pt has taken Thorazine on multiple occasions with no adverse effects.     Vitals: Blood pressure 143/94, pulse 87, temperature 98 F (36.7 C), temperature source Oral, resp. rate 18, height 5\' 7"  (1.702 m), weight 74.844 kg (165 lb), SpO2 98 %.  Risk to Self: Suicidal Ideation: Yes-Currently Present Suicidal Intent: Yes-Currently Present Is patient at risk for suicide?: Yes Suicidal Plan?: Yes-Currently Present Specify Current Suicidal Plan: "suicidal warfar" kill all the men who are keeping women from him, and then he would be killed in the battle  Access to Means: No Specify Access to Suicidal Means: "magical" methods to kill "all the men" and some of them would kill him What has been your use of drugs/alcohol within the last 12 months?: Pt has been abusing alcohol and cocaine for many years.  How many times?:  (has reported 0-5 on different occassions) Other Self Harm Risks: reports walks into traffic to beg for money  Triggers for Past Attempts: Other (Comment), Hallucinations Intentional Self Injurious Behavior: None Comment - Self Injurious Behavior: NA Risk to Others: Homicidal Ideation: Yes-Currently Present Thoughts of Harm to Others: Yes-Currently Present Comment - Thoughts of Harm to Others: reports he wants to use magic to kill all  the men Current Homicidal Intent: Yes-Currently Present Current Homicidal Plan: Yes-Currently Present Describe Current Homicidal Plan: "magic" to kill all the men Access to Homicidal Means: No Describe Access to Homicidal Means: "magic" reports he also has screw driver, hammer, and saw Identified Victim: "all the men" History of harm to others?: Yes Assessment of Violence: None Noted Violent Behavior Description: fighting, threats Does patient have access to weapons?: Yes (Comment) Criminal Charges Pending?: No Does patient have a court date: No Prior Inpatient Therapy: Prior Inpatient Therapy: Yes Prior Therapy Dates: Multiple admissions Prior Therapy Facilty/Provider(s): [facility] Reason for Treatment: Psychosis, SI/HI Prior Outpatient Therapy: Prior Outpatient Therapy: Yes Prior Therapy Dates: Current Prior Therapy Facilty/Provider(s): [name] Reason for Treatment: medication management, reports ACTT but states does not know which one  Current Facility-Administered Medications  Medication Dose Route Frequency Provider Last  Rate Last Dose  . acetaminophen (TYLENOL) tablet 650 mg  650 mg Oral Q4H PRN Azalia Bilis, MD   650 mg at 09/08/14 0331  . ARIPiprazole (ABILIFY) tablet 5 mg  5 mg Oral BID PC Lazer Wollard   5 mg at 09/08/14 0846  . benazepril (LOTENSIN) tablet 5 mg  5 mg Oral Daily Earley Favor, NP   5 mg at 09/08/14 1027  . benztropine (COGENTIN) tablet 1 mg  1 mg Oral BID Aiman Sonn   1 mg at 09/08/14 1027  . cloNIDine (CATAPRES) tablet 0.1 mg  0.1 mg Oral BID Earley Favor, NP   0.1 mg at 09/08/14 1027  . furosemide (LASIX) tablet 20 mg  20 mg Oral Daily Earley Favor, NP   20 mg at 09/08/14 1026  . metFORMIN (GLUCOPHAGE) tablet 500 mg  500 mg Oral Q breakfast Earley Favor, NP   500 mg at 09/08/14 0846  . traZODone (DESYREL) tablet 100 mg  100 mg Oral QHS PRN Rivkah Wolz       Current Outpatient Prescriptions  Medication Sig Dispense Refill  . ARIPiprazole  (ABILIFY) 5 MG tablet Take 5 mg by mouth daily.    Melene Muller ON 09/12/2014] ARIPiprazole 400 MG SUSR Inject 400 mg into the muscle every 30 (thirty) days. (Due to be given on 09-12-14 @ 08:00 am):For mood control 1 each 0  . aspirin 81 MG EC tablet Take 1 tablet (81 mg total) by mouth daily. For heart health 30 tablet 3  . benazepril (LOTENSIN) 5 MG tablet Take 1 tablet (5 mg total) by mouth daily. For high blood pressure 30 tablet 0  . benztropine (COGENTIN) 2 MG tablet Take 1 tablet (2 mg total) by mouth 2 (two) times daily. For prevention of drug induced tremors 60 tablet 0  . cloNIDine (CATAPRES) 0.1 MG tablet Take 1 tablet (0.1 mg total) by mouth 2 (two) times daily. For high blood pressure 60 tablet 0  . furosemide (LASIX) 20 MG tablet Take 1 tablet (20 mg total) by mouth daily. For swellings/high blood pressure 30 tablet 3  . metFORMIN (GLUCOPHAGE) 500 MG tablet Take 1 tablet (500 mg total) by mouth daily with breakfast. For high blood sugar control 30 tablet 0  . tamsulosin (FLOMAX) 0.4 MG CAPS capsule Take 1 capsule (0.4 mg total) by mouth daily after supper. For prostate health 30 capsule 0  . traZODone (DESYREL) 50 MG tablet Take 1 tablet (50 mg total) by mouth at bedtime as needed for sleep. 30 tablet 0  . ARIPiprazole (ABILIFY) 10 MG tablet Take 1 tablet (10 mg total) by mouth daily. For mood control 30 tablet 0    Musculoskeletal: Strength & Muscle Tone: within normal limits Gait & Station: normal Patient leans: N/A  Psychiatric Specialty Exam:  Today he is calm,cooperative and is coherent  Asking to be discharged.  His speech is normal, he denies A/V hallucination. Physical Exam  Psychiatric: His mood appears anxious. His affect is angry and labile. His speech is rapid and/or pressured and tangential. He is aggressive and actively hallucinating. Thought content is paranoid and delusional. Cognition and memory are normal. He expresses impulsivity.    ROS  Blood pressure 143/94,  pulse 87, temperature 98 F (36.7 C), temperature source Oral, resp. rate 18, height  (1.702 m), weight 74.844 kg (165 lb), SpO2 98 %.Body mass index is 25.84 kg/(m^2).  General Appearance: Casual and Disheveled  Eye Contact::  Good  Speech:  Clear and Coherent and Normal  Rate  Volume:  Increased  Mood:  Depressed  Affect:  Congruent  Thought Process:  Coherent, Goal Directed and Intact  Orientation:  Full (Time, Place, and Person)  Thought Content:  Delusions and Paranoid Ideation  Suicidal Thoughts:  No  Homicidal Thoughts:  No  Memory:  Immediate;   Fair Recent;   Fair Remote;   Fair  Judgement:  Fair  Insight:  Fair  Psychomotor Activity:  Normal  Concentration:  Fair  Recall:  [employer]Fair  Fund of Knowledge:Fair  Language: Good  Akathisia:  No  Handed:  Right  AIMS (if indicated):     Assets:  Desire for Improvement  ADL's:  Intact  Cognition: WNL  Sleep:   poor   Medical Decision Making: Established Problem, Stable/Improving (1)  Treatment Plan Summary: Plan Discharge home  Plan:  Discussed crisis plan, support from social network, calling 911, coming to the Emergency Department, and calling Suicide Hotline. Discharge home Disposition: Discharge home  Earney NavyONUOHA, JOSEPHINE C, PMHNP-BC 09/08/2014 12:02 PM Patient seen face-to-face for psychiatric evaluation, chart reviewed and case discussed with the physician extender and developed treatment plan. Reviewed the information documented and agree with the treatment plan. Thedore MinsMojeed Darric Plante, MD

## 2014-09-08 NOTE — [facility] Suicide Risk Assessment (Cosign Needed)
Suicide Risk Assessment  Discharge Assessment   [facility]   Demographic Factors:  Male, Low socioeconomic status, Living alone and Unemployed  Total Time spent with patient: 20 minutes  Musculoskeletal: Strength & Muscle Tone: within normal limits Gait & Station: normal Patient leans: N/A  Psychiatric Specialty Exam:     Blood pressure 143/94, pulse 87, temperature 98 F (36.7 C), temperature source Oral, resp. rate 18, height 5\' 7"  (1.702 m), weight 74.844 kg (165 lb), SpO2 98 %.Body mass index is 25.84 kg/(m^2).  General Appearance: Casual and Disheveled  Eye Contact::  Good  Speech:  Clear and Coherent and Normal Rate409  Volume:  Normal  Mood:  Depressed  Affect:  Congruent and Depressed  Thought Process:  Coherent, Goal Directed and Intact  Orientation:  Full (Time, Place, and Person)  Thought Content:  WDL  Suicidal Thoughts:  No  Homicidal Thoughts:  No  Memory:  Immediate;   Good Recent;   Good Remote;   Fair  Judgement:  Fair  Insight:  Fair  Psychomotor Activity:  Normal  Concentration:  Good  Recall:  NA  Fund of Knowledge:Fair  Language: Good  Akathisia:  NA  Handed:  Right  AIMS (if indicated):     Assets:  Desire for Improvement  Sleep:     Cognition: WNL  ADL's:  Impaired      Has this patient used any form of tobacco in the last 30 days? (Cigarettes, Smokeless Tobacco, Cigars, and/or Pipes) Yes, A prescription for an FDA-approved tobacco cessation medication was offered at discharge and the patient refused  Mental Status Per Nursing Assessment::   On Admission:     Current Mental Status by Physician: NA  Loss Factors: NA  Historical Factors: NA  Risk Reduction Factors:   Positive social support  Continued Clinical Symptoms:  Depression:   Insomnia Alcohol/Substance Abuse/Dependencies Schizophrenia:   Depressive state Paranoid or undifferentiated type  Cognitive Features That Contribute To Risk:   Polarized thinking    Suicide Risk:  Minimal: No identifiable suicidal ideation.  Patients presenting with no risk factors but with morbid ruminations; may be classified as minimal risk based on the severity of the depressive symptoms  Principal Problem: Chronic paranoid schizophrenia Discharge Diagnoses:  Patient Active Problem List   Diagnosis Date Noted  . Chronic paranoid schizophrenia [F20.0] 09/07/2014  . Suicidal ideations [R45.851] 08/28/2014  . Substance induced mood disorder [F19.94] 08/26/2014  . Schizophrenia [F20.9] 08/11/2014  . 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 Acute CHF (congestive heart failure) [I50.9] 07/29/2014  . Cocaine use disorder, severe, dependence [F14.20]   . Essential hypertension, benign [I10] 03/28/2013  . Diabetes mellitus [E11.9] 03/15/2013      Plan Of Care/Follow-up recommendations:  Activity:  as tolerated Diet:  regular  Is patient on multiple antipsychotic therapies at discharge:  No   Has Patient had three or more failed trials of antipsychotic monotherapy by history:  No  Recommended Plan for Multiple Antipsychotic Therapies: NA    Maciah Schweigert C    PMHNP-BC 09/08/2014, 12:19 PM

## 2014-09-08 NOTE — Discharge Instructions (Addendum)
For your ongoing mental health needs, you are advised to follow up with [name].  You have an appointment scheduled with Dr. Dannette BarbaraAlumu on Wednesday, September 14, 2014 at 2:00 pm.  You are advised to keep this appointment:       [name]      [address]      [place], [place] [address]      [phone]  If you have need for shelter services, contact one of the following homeless shelters:       [facility]      [address]      [place], [place] [address]      [phone]       [facility]      [address]      [place], [place] [address]      [phone]

## 2014-09-10 NOTE — ED Notes (Signed)
Per EMS pt picked up from in front of restaurant with foot pain and paranoia.

## 2014-09-11 ENCOUNTER — Encounter (HOSPITAL_COMMUNITY): Payer: Self-pay | Admitting: Emergency Medicine

## 2014-09-11 ENCOUNTER — Emergency Department (HOSPITAL_COMMUNITY)
Admission: EM | Admit: 2014-09-11 | Discharge: 2014-09-11 | Disposition: A | Discharge status: Home / Self Care | Payer: Medicare Other | Attending: Emergency Medicine | Admitting: Emergency Medicine

## 2014-09-11 DIAGNOSIS — F1994 Other psychoactive substance use, unspecified with psychoactive substance-induced mood disorder: Secondary | ICD-10-CM

## 2014-09-11 DIAGNOSIS — G8929 Other chronic pain: Secondary | ICD-10-CM | POA: Insufficient documentation

## 2014-09-11 DIAGNOSIS — F1410 Cocaine abuse, uncomplicated: Secondary | ICD-10-CM

## 2014-09-11 DIAGNOSIS — Z79899 Other long term (current) drug therapy: Secondary | ICD-10-CM | POA: Insufficient documentation

## 2014-09-11 DIAGNOSIS — F1414 Cocaine abuse with cocaine-induced mood disorder: Secondary | ICD-10-CM | POA: Insufficient documentation

## 2014-09-11 DIAGNOSIS — Z590 Homelessness: Secondary | ICD-10-CM | POA: Insufficient documentation

## 2014-09-11 DIAGNOSIS — B20 Human immunodeficiency virus [HIV] disease: Secondary | ICD-10-CM | POA: Insufficient documentation

## 2014-09-11 DIAGNOSIS — Z8669 Personal history of other diseases of the nervous system and sense organs: Secondary | ICD-10-CM | POA: Insufficient documentation

## 2014-09-11 DIAGNOSIS — I509 Heart failure, unspecified: Secondary | ICD-10-CM | POA: Insufficient documentation

## 2014-09-11 DIAGNOSIS — Z720 Tobacco use: Secondary | ICD-10-CM | POA: Insufficient documentation

## 2014-09-11 DIAGNOSIS — I10 Essential (primary) hypertension: Secondary | ICD-10-CM | POA: Insufficient documentation

## 2014-09-11 DIAGNOSIS — E119 Type 2 diabetes mellitus without complications: Secondary | ICD-10-CM | POA: Insufficient documentation

## 2014-09-11 DIAGNOSIS — Z8619 Personal history of other infectious and parasitic diseases: Secondary | ICD-10-CM | POA: Insufficient documentation

## 2014-09-11 DIAGNOSIS — Z7982 Long term (current) use of aspirin: Secondary | ICD-10-CM | POA: Insufficient documentation

## 2014-09-11 NOTE — Discharge Instructions (Signed)
It was our pleasure to provide your ER care today - we hope that you feel better.  Follow up with primary care doctor in the coming week.  For mental health issues and/or crisis, go directly to [facility].  Also, follow up with [facility] on [address].  Return to ER if worse, new symptoms, medical emergency, other concern.      Stimulant Use Disorder-Cocaine Cocaine is one of a group of powerful drugs called stimulants. Cocaine has medical uses for stopping nosebleeds and for pain control before minor nose or dental surgery. However, cocaine is misused because of the effects that it produces. These effects include:   A feeling of extreme pleasure.  Alertness.  High energy. Common street names for cocaine include coke, crack, blow, snow, and nose candy. Cocaine is snorted, dissolved in water and injected, or smoked.  Stimulants are addictive because they activate regions of the brain that produce both the pleasurable sensation of "reward" and psychological dependence. Together, these actions account for loss of control and the rapid development of drug dependence. This means you become ill without the drug (withdrawal) and need to keep using it to function.  Stimulant use disorder is use of stimulants that disrupts your daily life. It disrupts relationships with family and friends and how you do your job. Cocaine increases your blood pressure and heart rate. It can cause a heart attack or stroke. Cocaine can also cause death from irregular heart rate or seizures. SYMPTOMS Symptoms of stimulant use disorder with cocaine include:  Use of cocaine in larger amounts or over a longer period of time than intended.  Unsuccessful attempts to cut down or control cocaine use.  A lot of time spent obtaining, using, or recovering from the effects of cocaine.  A strong desire or urge to use cocaine (craving).  Continued use of cocaine in spite of major problems at work,  school, or home because of use.  Continued use of cocaine in spite of relationship problems because of use.  Giving up or cutting down on important life activities because of cocaine use.  Use of cocaine over and over in situations when it is physically hazardous, such as driving a car.  Continued use of cocaine in spite of a physical problem that is likely related to use. Physical problems can include:  Malnutrition.  Nosebleeds.  Chest pain.  High blood pressure.  A hole that develops between the part of your nose that separates your nostrils (perforated nasal septum).  Lung and kidney damage.  Continued use of cocaine in spite of a mental problem that is likely related to use. Mental problems can include:  Schizophrenia-like symptoms.  Depression.  Bipolar mood swings.  Anxiety.  Sleep problems.  Need to use more and more cocaine to get the same effect, or lessened effect over time with use of the same amount of cocaine (tolerance).  Having withdrawal symptoms when cocaine use is stopped, or using cocaine to reduce or avoid withdrawal symptoms. Withdrawal symptoms include:  Depressed or irritable mood.  Low energy or restlessness.  Bad dreams.  Poor or excessive sleep.  Increased appetite. DIAGNOSIS Stimulant use disorder is diagnosed by your health care provider. You may be asked questions about your cocaine use and how it affects your life. A physical exam may be done. A drug screen may be ordered. You may be referred to a mental health professional. The diagnosis of stimulant use disorder requires at least two symptoms within 12 months.  The type of stimulant use disorder depends on the number of signs and symptoms you have. The type may be:  Mild. Two or three signs and symptoms.  Moderate. Four or five signs and symptoms.  Severe. Six or more signs and symptoms. TREATMENT Treatment for stimulant use disorder is usually provided by mental health  professionals with training in substance use disorders. The following options are available:  Counseling or talk therapy. Talk therapy addresses the reasons you use cocaine and ways to keep you from using again. Goals of talk therapy include:  Identifying and avoiding triggers for use.  Handling cravings.  Replacing use with healthy activities.  Support groups. Support groups provide emotional support, advice, and guidance.  Medicine. Certain medicines may decrease cocaine cravings or withdrawal symptoms. HOME CARE INSTRUCTIONS  Take medicines only as directed by your health care provider.  Identify the people and activities that trigger your cocaine use and avoid them.  Keep all follow-up visits as directed by your health care provider. SEEK MEDICAL CARE IF:  Your symptoms get worse or you relapse.  You are not able to take medicines as directed. SEEK IMMEDIATE MEDICAL CARE IF:  You have serious thoughts about hurting yourself or others.  You have a seizure, chest pain, sudden weakness, or loss of speech or vision. FOR MORE INFORMATION  National Institute on Drug Abuse: http://www.price-smith.com/  Substance Abuse and Mental Health Services Administration: SkateOasis.com.pt Document Released: 06/14/2000 Document Revised: 11/01/2013 Document Reviewed: 06/30/2013 [facility] Patient Information 2015 [place], [place]. This information is not intended to replace advice given to you by your health care provider. Make sure you discuss any questions you have with your health care provider.     Emergency Department Resource Guide 1) Find a Doctor and Pay Out of Pocket Although you won't have to find out who is covered by your insurance plan, it is a good idea to ask around and get recommendations. You will then need to call the office and see if the doctor you have chosen will accept you as a new patient and what types of options they offer for patients who are self-pay. Some doctors offer  discounts or will set up payment plans for their patients who do not have insurance, but you will need to ask so you aren't surprised when you get to your appointment.  2) Contact Your Local Health Department Not all health departments have doctors that can see patients for sick visits, but many do, so it is worth a call to see if yours does. If you don't know where your local health department is, you can check in your phone book. The CDC also has a tool to help you locate your state's health department, and many state websites also have listings of all of their local health departments.  3) Find a Walk-in Clinic If your illness is not likely to be very severe or complicated, you may want to try a walk in clinic. These are popping up all over the country in pharmacies, drugstores, and shopping centers. They're usually staffed by nurse practitioners or physician assistants that have been trained to treat common illnesses and complaints. They're usually fairly quick and inexpensive. However, if you have serious medical issues or chronic medical problems, these are probably not your best option.  No Primary Care Doctor: - Call Health Connect at  [phone] - they can help you locate a primary care doctor that  accepts your insurance, provides certain services, etc. - Physician Referral Service- [phone]  Chronic Pain Problems: Organization         Address  Phone   Notes  [facility]  [phone] Patients need to be referred by their primary care doctor.   Medication Assistance: Organization         Address  Phone   Notes  [facility] Medication [facility] [address] [place] [place], [place] [address] [phone] --Must be a resident of [facility] -- Must have NO insurance coverage whatsoever (no Medicaid/ Medicare, etc.) -- The pt. MUST have a primary care doctor that directs their care regularly and follows them in the community   [facility]   [phone]   [employer]  [phone]    Agencies that provide inexpensive medical care: Organization         Address  Phone   Notes  [facility]  [phone]   [facility]    [phone]   [facility] [address] [place], [place] [address] [phone]   [facility] [address] [place], [place] [phone]   Planned Parenthood    [phone]   [facility]    [phone]   Community Health and [facility]  [address], [place] Phone:  [phone], Fax:  [phone] Hours of Operation:  9 am - 6 pm, M-F.  Also accepts Medicaid/Medicare and self-pay.  [facility]  [address], [place] Phone: [phone], Fax: [phone]. Hours of Operation:  8:30 am - 5:30 pm, M-F.  Also accepts Medicaid and self-pay.  [facility] [place] [address] [place] Phone: [phone]   [facility] [address] Natasha Bence [place], [place] [phone], Ext. [phone] Mondays & Thursdays: 7-9 AM.  First 15 patients are seen on a first come, first serve basis.    Medicaid-accepting [facility] Providers:  Organization         Address  Phone   Notes  [facility] [address], [place] [phone] Also accepts self-pay patients.  [facility] [address] Laurell Josephs [place], [place]  [phone]   [facility] [address], [place] [phone]   [facility] [address] [place] [phone]   Renaye Rakers [address], [place]   [phone] Only accepts [place] patients after they have their name applied to their card.   Self-Pay (no insurance) in [facility]:  Organization         Address  Phone   Notes  Sickle Cell Patients, [facility] [address] [place], [place] [phone]   [facility] [address] [place], [place] [phone]   [facility]  [address], [place] [phone]   [facility]/Dr. Osei-Bonsu  [address] [place] or [address], [place] [phone] Phone number for both [place] and [place] locations is the same.  Urgent Medical and [facility] [address] [place] [phone]   [facility] [address], [place] or [address] [phone]   [facility] [address], [place] [phone], phone; [phone], fax Sees  patients 1st and 3rd Saturday of every month.  Must not qualify for public or private insurance (i.e. Medicaid, Medicare, [place] Health Choice, Veterans' Benefits)  Household income should be no more than 200% of the poverty level The clinic cannot treat you if you are pregnant or think you are pregnant  Sexually transmitted diseases are not treated at the clinic.    Dental Care: Organization         Address  Phone  Notes  [facility] Department of [facility] [address] [place], [place] [phone] Accepts children up to age 74 who are enrolled in [place] or [place] Health Choice; pregnant women with a Medicaid card; and children who have applied for Medicaid or [place] Health Choice, but were declined, whose parents can pay a reduced fee at time of service.  [facility] Department of [facility]  [address], [place] [phone] Accepts children up to age 9 who are enrolled in [place] or [place] Health Choice; pregnant women with a Medicaid card; and children who have applied for Medicaid or  Health Choice, but were declined, whose parents can pay a reduced fee at time of service.  [facility]  [address] [place], [place]  [phone] Patients are seen by appointment only. Walk-ins are not accepted. [facility] will see patients 54 years of age and older. Monday - Tuesday (8am-5pm) Most Wednesdays (8:30-5pm) $30 per visit, cash only  [facility] Adult Dental Access PROGRAM  [address], [facility] [phone] Patients are seen by appointment only. Walk-ins are not accepted. [facility] will see patients 30 years of age and older. One Wednesday Evening (Monthly: Volunteer Based).  $30 per visit, cash only  [employer]  [phone] for adults; Children under age 75, call Graduate Pediatric Dentistry at [phone]. Children aged 58-14, please call [phone] to request a pediatric application.  Dental services are provided in all areas of dental care including fillings, crowns and bridges, complete and partial dentures, implants, gum treatment, root canals, and extractions. Preventive care is also provided. Treatment is provided to both adults and children. Patients are selected via a lottery and there is often a waiting list.   [facility] [address], [place]  [phone] [email or web address]   [facility] [address] [place], [place] [phone], Ext. [phone] Second and Fourth Thursday of each month, opens at 6:30 AM; Clinic ends at 9 AM.  Patients are seen on a first-come first-served basis, and a limited number are seen during each clinic.   [facility]  [address] [place], [place] [phone]   Eligibility Requirements You must have lived in [place], [place], or [place] counties for at least the last three months.   You cannot be eligible for state or federal sponsored [place], including [employer], [place], or [employer].   You generally cannot be eligible for healthcare insurance through your employer.    How to apply: Eligibility screenings are held every Tuesday and Wednesday  afternoon from 1:00 pm until 4:00 pm. You do not need an appointment for the interview!  [facility] [address], [place], [place] [phone]   [facility] Health Department  [phone]   [facility] Health Department  [phone]   [facility] Health Department  [phone]    Behavioral Health Resources in the Community: Intensive Outpatient Programs  Organization         Address  Phone  Notes  [facility] [address], [place], [place] [phone]   [facility] [address], [place], [place] [phone]   [facility] [address], [place], [place]  [phone]   [facility] [address],  [place], [place] [phone] or [phone]   Substance Abuse Resources Organization         Address  Phone  Notes  [facility]  [phone]   [facility]  [phone]   [facility]  [phone]   [facility]  [phone]   Residential & Outpatient Substance Abuse Program  [phone]   Psychological Services Organization         Address  Phone  Notes  [facility]  [phone]- [phone]   [facility]  [phone]- [phone]   [facility] [address], [place] [phone] or [phone]    Mobile Crisis Teams Organization         Address  Phone  Notes  [facility], Mobile Crisis Care Unit  [phone]   [facility]  [address] [place], [place] [phone]   Doristine LocksSharon DeEsch [address] [place] [phone]    Self-Help/Support Groups Organization         Address  Phone             Notes  [facility] [place] - variety of support groups  [phone] Call for more information  Narcotics Anonymous (NA), [facility] [address], [place] [facility]  2 meetings at this location   Nutritional therapistesidential Treatment  Programs Organization         Address  Phone  Notes  [facility] [address],    [place]  [phone]   [name]  [address], [place] [phone], [place], [place] [phone]   [facility] [address], [place] [phone] Admissions: 8am-3pm M-F  [facility] [address]    [place], [place] [phone]   [facility] [address], [place], [place] [phone]   [facility] [address]  [place], [place] [phone]   [facility] - Intensive Outpatient [address], [place], [place] [phone]   [facility] ([facility] [address]  [place], [place] [phone] or [phone]   [facility] ([facility]) [address] [place], [place] [phone] Accepts Medicaid  [facility] [address]  [place] [phone] Substance Abuse/Addiction Treatment   [facility] Resources Organization         Address  Phone  Notes  [facility]  [phone]   Angie FavaJulie Brannon, PhD [address], [address] [place], [place]   [phone] or [phone]   [facility]   [address] [place], [place] [phone]   [facility] [address], [place], [place] [phone] Insurance/Medicaid/sponsorship through [facility]  [facility]ith [facility] Families [address]                                    [place], [place] [phone] Therapy/tele-psych/case  [facility] [address]   [place], [place] [phone]    Dr. Lolly MustacheArfeen  [phone]  [phone]   [facility] Dept. 1) [address] [place] 2) [place] 3)  [address], [place] [phone]  [phone]   [place] [phone] or [phone] (After Hours)

## 2014-09-11 NOTE — ED Provider Notes (Signed)
CSN: [id number]     Arrival date & time 09/11/14  4098 History   First MD Initiated Contact with Patient 09/11/14 [phone]     Chief Complaint  Patient presents with  . Paranoid  . Foot Pain     (Consider location/radiation/quality/duration/timing/severity/associated sxs/prior Treatment) Patient is a 54 y.o. male presenting with lower extremity pain. The history is provided by the patient. The history is limited by the condition of the patient.  Foot Pain Pertinent negatives include no chest pain, no abdominal pain, no headaches and no shortness of breath.  pt with hx schizophrenia, homelessness, substance abuse, frequent ed visits, who was picked up at [employer] late last night acting odd, and c/o bilateral foot pain. Pt has rested in ED for several hours and currently denies c/o. Denies injury or fall. No fever or chills. Pt denies headache, no cp or sob. No abd pain. No nvd. Pt is alert, oriented. Endorses ongoing cocaine abuse. Denies etoh abuse. Pt states just hungry now. Pt poor historian - level 5 caveat.      Past Medical History  Diagnosis Date  . Diabetes mellitus without complication   . Hypertension   . Schizophrenia   . CHF (congestive heart failure)   . Neuropathy   . Polysubstance abuse   . Cocaine abuse   . Homelessness   . HIV (human immunodeficiency virus infection)   . Hepatitis C   . Chronic foot pain    History reviewed. No pertinent past surgical history. Family History  Problem Relation Age of Onset  . Hypertension Other   . Diabetes Other    History  Substance Use Topics  . Smoking status: Current Every Day Smoker -- 1.00 packs/day for 20 years    Types: Cigarettes  . Smokeless tobacco: Current User  . Alcohol Use: Yes     Comment: Pt denies    Review of Systems  Constitutional: Negative for fever.  HENT: Negative for sore throat.   Eyes: Negative for pain and redness.  Respiratory: Negative for cough and shortness of breath.    Cardiovascular: Negative for chest pain.  Gastrointestinal: Negative for vomiting, abdominal pain and diarrhea.  Genitourinary: Negative for flank pain.  Musculoskeletal: Negative for back pain and neck pain.  Skin: Negative for rash and wound.  Neurological: Negative for headaches.  Hematological: Does not bruise/bleed easily.  Psychiatric/Behavioral: Negative for suicidal ideas, hallucinations and self-injury.      Allergies  Haldol  Home Medications   Prior to Admission medications   Medication Sig Start Date End Date Taking? Authorizing Provider  ARIPiprazole (ABILIFY) 10 MG tablet Take 1 tablet (10 mg total) by mouth daily. For mood control 08/12/14   Sanjuana Kava, NP  ARIPiprazole 400 MG SUSR Inject 400 mg into the muscle every 30 (thirty) days. (Due to be given on 09-12-14 @ 08:00 am):For mood control 09/12/14   Sanjuana Kava, NP  aspirin 81 MG EC tablet Take 1 tablet (81 mg total) by mouth daily. For heart health 08/12/14   Sanjuana Kava, NP  benazepril (LOTENSIN) 5 MG tablet Take 1 tablet (5 mg total) by mouth daily. For high blood pressure 08/12/14   Sanjuana Kava, NP  benztropine (COGENTIN) 2 MG tablet Take 1 tablet (2 mg total) by mouth 2 (two) times daily. For prevention of drug induced tremors 08/12/14   Sanjuana Kava, NP  cloNIDine (CATAPRES) 0.1 MG tablet Take 1 tablet (0.1 mg total) by mouth 2 (two) times daily. For  high blood pressure 08/12/14   Sanjuana KavaAgnes I Nwoko, NP  furosemide (LASIX) 20 MG tablet Take 1 tablet (20 mg total) by mouth daily. For swellings/high blood pressure 08/12/14   Sanjuana KavaAgnes I Nwoko, NP  metFORMIN (GLUCOPHAGE) 500 MG tablet Take 1 tablet (500 mg total) by mouth daily with breakfast. For high blood sugar control 08/12/14   Sanjuana KavaAgnes I Nwoko, NP  tamsulosin (FLOMAX) 0.4 MG CAPS capsule Take 1 capsule (0.4 mg total) by mouth daily after supper. For prostate health 08/12/14   Sanjuana KavaAgnes I Nwoko, NP  traZODone (DESYREL) 50 MG tablet Take 1 tablet (50 mg total) by mouth at  bedtime as needed for sleep. 08/12/14   Sanjuana KavaAgnes I Nwoko, NP   BP 169/96 mmHg  Pulse 72  Temp(Src) 98.2 F (36.8 C) (Oral)  Resp 15  SpO2 98% Physical Exam  Constitutional: He is oriented to person, place, and time. He appears well-developed and well-nourished. No distress.  HENT:  Head: Atraumatic.  Mouth/Throat: Oropharynx is clear and moist.  Eyes: Conjunctivae are normal. Pupils are equal, round, and reactive to light. No scleral icterus.  Neck: Neck supple. No tracheal deviation present. No thyromegaly present.  Cardiovascular: Normal rate, regular rhythm, normal heart sounds and intact distal pulses.   Pulmonary/Chest: Effort normal and breath sounds normal. No accessory muscle usage. No respiratory distress. He exhibits no tenderness.  Abdominal: Soft. Bowel sounds are normal. He exhibits no distension. There is no tenderness.  Genitourinary:  No cva tenderness  Musculoskeletal: Normal range of motion. He exhibits no edema or tenderness.  CTLS spine, non tender, aligned, no step off.   Neurological: He is alert and oriented to person, place, and time.  Steady gait.   Skin: Skin is warm and dry. No rash noted. He is not diaphoretic.  Psychiatric: He has a normal mood and affect.  Nursing note and vitals reviewed.   ED Course  Procedures (including critical care time) Labs Review     MDM   Reviewed nursing notes and prior charts for additional history.   Pt with labs from 3 days ago, unremarkable then as compared to prior.  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   Pt denies current c/o pain or injury, no fever.  Po fluids, meal.  Ambulate in hall.  Recheck, pt requests d/c from ED.  pcp follow up encouraged.     Cathren LaineKevin Daneil Beem, MD 09/11/14 [phone]

## 2014-09-12 ENCOUNTER — Encounter (HOSPITAL_COMMUNITY): Payer: Self-pay

## 2014-09-12 ENCOUNTER — Emergency Department (HOSPITAL_COMMUNITY)
Admission: EM | Admit: 2014-09-12 | Discharge: 2014-09-12 | Disposition: A | Discharge status: Home / Self Care | Payer: Medicare Other | Attending: Emergency Medicine | Admitting: Emergency Medicine

## 2014-09-12 DIAGNOSIS — I10 Essential (primary) hypertension: Secondary | ICD-10-CM | POA: Diagnosis not present

## 2014-09-12 DIAGNOSIS — Z720 Tobacco use: Secondary | ICD-10-CM | POA: Diagnosis not present

## 2014-09-12 DIAGNOSIS — Z79899 Other long term (current) drug therapy: Secondary | ICD-10-CM | POA: Insufficient documentation

## 2014-09-12 DIAGNOSIS — M79672 Pain in left foot: Secondary | ICD-10-CM | POA: Diagnosis present

## 2014-09-12 DIAGNOSIS — Z21 Asymptomatic human immunodeficiency virus [HIV] infection status: Secondary | ICD-10-CM | POA: Diagnosis not present

## 2014-09-12 DIAGNOSIS — Z590 Homelessness: Secondary | ICD-10-CM

## 2014-09-12 DIAGNOSIS — F1994 Other psychoactive substance use, unspecified with psychoactive substance-induced mood disorder: Secondary | ICD-10-CM | POA: Diagnosis present

## 2014-09-12 DIAGNOSIS — Z8619 Personal history of other infectious and parasitic diseases: Secondary | ICD-10-CM | POA: Diagnosis not present

## 2014-09-12 DIAGNOSIS — E119 Type 2 diabetes mellitus without complications: Secondary | ICD-10-CM | POA: Diagnosis not present

## 2014-09-12 DIAGNOSIS — Z7982 Long term (current) use of aspirin: Secondary | ICD-10-CM | POA: Diagnosis not present

## 2014-09-12 DIAGNOSIS — F200 Paranoid schizophrenia: Secondary | ICD-10-CM | POA: Diagnosis not present

## 2014-09-12 DIAGNOSIS — G8929 Other chronic pain: Secondary | ICD-10-CM | POA: Insufficient documentation

## 2014-09-12 DIAGNOSIS — F19929 Other psychoactive substance use, unspecified with intoxication, unspecified: Secondary | ICD-10-CM | POA: Diagnosis present

## 2014-09-12 DIAGNOSIS — I509 Heart failure, unspecified: Secondary | ICD-10-CM | POA: Diagnosis not present

## 2014-09-12 DIAGNOSIS — F17210 Nicotine dependence, cigarettes, uncomplicated: Secondary | ICD-10-CM

## 2014-09-12 DIAGNOSIS — F14129 Cocaine abuse with intoxication, unspecified: Secondary | ICD-10-CM

## 2014-09-12 DIAGNOSIS — M79671 Pain in right foot: Secondary | ICD-10-CM | POA: Diagnosis not present

## 2014-09-12 DIAGNOSIS — Z8659 Personal history of other mental and behavioral disorders: Secondary | ICD-10-CM

## 2014-09-12 MED ORDER — IBUPROFEN 200 MG PO TABS
600.0000 mg | ORAL_TABLET | Freq: Four times a day (QID) | ORAL | Status: DC | PRN
  Filled 2014-09-12: qty 3

## 2014-09-12 NOTE — Discharge Instructions (Signed)
For your ongoing mental health needs, you are advised to follow up with [name].  You have an appointment scheduled with Dr Alamu on Wednesday, 09/14/2014 at 2:00 pm: ° °     [name] °     [address] °     [place], [place] [address] °     [phone] ° °

## 2014-09-12 NOTE — ED Notes (Signed)
Pt yelled down hallway from room "Hey wheres my damn blanket and crackers!" This writer brought blanket and flat sheet to pt. Crackers and peanut butter were not given as pt was still eating breakfast and limits were set on when snack were given out. Pt later yelled at this writer for not bringing him crackers and peanut butter. Pt pointed at this writer calling me a b**ch and telling security guards that I was the one who he told to bring him crackers and peanut butter but I was being a b**ch. Pt then gave this writer the middle finger.

## 2014-09-12 NOTE — ED Notes (Signed)
Upon arrival to ED and up until seen by EDP, patient has not mentioned ANY issues r/t being suicidal After patient seen by EDP, patient now states he is SI and wants to "throw himself in front of a bus"

## 2014-09-12 NOTE — ED Notes (Signed)
Currently resting quietly in bed with eyes closed. Respirations are even and unlabored. No acute distress noted. Safety has been maintained with q15 minute observation. Will continue current POC 

## 2014-09-12 NOTE — ED Notes (Signed)
Entered patient to dress out due to SI Patient noted to be sleeping Patient informed that he needs to undress and get in paper scrubs Patient put on scrub pants and would only put shirt on half way

## 2014-09-12 NOTE — BH Assessment (Signed)
[facility] Assessment Progress Note  Per Thedore MinsMojeed Akintayo, MD this [facility]  [facility] Wednesday, 09/14/2014 at 14:00.  This information has been included in pt's discharge instructions.  Pt's nurse has been notified.  Doylene Canninghomas Andie Mortimer, MA Triage Specialist 09/12/2014 @ 11:29

## 2014-09-12 NOTE — ED Provider Notes (Signed)
CSN: [id number]     Arrival date & time 09/12/14  [id number] History   First MD Initiated Contact with Patient 09/12/14 [phone]     Chief Complaint  Patient presents with  . Foot Pain     (Consider location/radiation/quality/duration/timing/severity/associated sxs/prior Treatment) HPI Comments: Patient with a history of DM, HTN, Schizophrenia, Polysubstance Abuse, Cocaine Abuse, HIV, Homelessness, and chronic foot pain presents today with a chief complaint of bilateral foot pain.  He is unclear how long he has had the foot pain.  He reports that the pain is the same pain that he has had in the past.  He has not taken anything for pain prior to arrival.  Denies injury or trauma.  No fever, chills, or feet swelling.  He was seen in the ED yesterday for the same.  He is also complaining of suicidal ideations.  No specific plan.    Patient is a 54 y.o. male presenting with lower extremity pain. The history is provided by the patient.  Foot Pain    Past Medical History  Diagnosis Date  . Diabetes mellitus without complication   . Hypertension   . Schizophrenia   . CHF (congestive heart failure)   . Neuropathy   . Polysubstance abuse   . Cocaine abuse   . Homelessness   . HIV (human immunodeficiency virus infection)   . Hepatitis C   . Chronic foot pain    History reviewed. No pertinent past surgical history. Family History  Problem Relation Age of Onset  . Hypertension Other   . Diabetes Other    History  Substance Use Topics  . Smoking status: Current Every Day Smoker -- 1.00 packs/day for 20 years    Types: Cigarettes  . Smokeless tobacco: Current User  . Alcohol Use: Yes     Comment: Pt denies    Review of Systems  All other systems reviewed and are negative.     Allergies  Haldol  Home Medications   Prior to Admission medications   Medication Sig Start Date End Date Taking? Authorizing Provider  ARIPiprazole (ABILIFY) 10 MG tablet Take 1 tablet (10 mg total) by mouth  daily. For mood control 08/12/14   Sanjuana KavaAgnes I Nwoko, NP  ARIPiprazole 400 MG SUSR Inject 400 mg into the muscle every 30 (thirty) days. (Due to be given on 09-12-14 @ 08:00 am):For mood control 09/12/14   Sanjuana KavaAgnes I Nwoko, NP  aspirin 81 MG EC tablet Take 1 tablet (81 mg total) by mouth daily. For heart health 08/12/14   Sanjuana KavaAgnes I Nwoko, NP  benazepril (LOTENSIN) 5 MG tablet Take 1 tablet (5 mg total) by mouth daily. For high blood pressure 08/12/14   Sanjuana KavaAgnes I Nwoko, NP  benztropine (COGENTIN) 2 MG tablet Take 1 tablet (2 mg total) by mouth 2 (two) times daily. For prevention of drug induced tremors 08/12/14   Sanjuana KavaAgnes I Nwoko, NP  cloNIDine (CATAPRES) 0.1 MG tablet Take 1 tablet (0.1 mg total) by mouth 2 (two) times daily. For high blood pressure 08/12/14   Sanjuana KavaAgnes I Nwoko, NP  furosemide (LASIX) 20 MG tablet Take 1 tablet (20 mg total) by mouth daily. For swellings/high blood pressure 08/12/14   Sanjuana KavaAgnes I Nwoko, NP  metFORMIN (GLUCOPHAGE) 500 MG tablet Take 1 tablet (500 mg total) by mouth daily with breakfast. For high blood sugar control 08/12/14   Sanjuana KavaAgnes I Nwoko, NP  tamsulosin (FLOMAX) 0.4 MG CAPS capsule Take 1 capsule (0.4 mg total) by mouth daily after supper. For prostate  health 08/12/14   Sanjuana Kava, NP  traZODone (DESYREL) 50 MG tablet Take 1 tablet (50 mg total) by mouth at bedtime as needed for sleep. 08/12/14   Sanjuana Kava, NP   BP 171/84 mmHg  Pulse 88  Temp(Src) 98.2 F (36.8 C) (Oral)  Resp 16  SpO2 100% Physical Exam  Constitutional: He appears well-developed and well-nourished.  HENT:  Head: Normocephalic and atraumatic.  Mouth/Throat: Oropharynx is clear and moist.  Neck: Normal range of motion. Neck supple.  Cardiovascular: Normal rate, regular rhythm and normal heart sounds.   Pulses:      Dorsalis pedis pulses are 2+ on the right side, and 2+ on the left side.  Pulmonary/Chest: Effort normal and breath sounds normal.  Musculoskeletal: Normal range of motion.  Full ROM of ankles and toes  bilaterally No erythema, edema, or warmth of feet bilaterally Several calluses on the feet bilaterally  Neurological: He is alert.  Distal sensation of feet intact bilaterally  Skin: Skin is warm and dry.  Psychiatric: He has a normal mood and affect.  Nursing note and vitals reviewed.   ED Course  Procedures (including critical care time) Labs Review Labs Reviewed - No data to display  Imaging Review No results found.   EKG Interpretation None      MDM   Final diagnoses:  None   Patient with an extensive psychiatric history and frequent ED visits presents today with bilateral foot pain.  No injury or trauma.  No signs of infection on exam.  Upon discharge he stated that he was suicidal.  TTS evaluated the patient and patient was determined to be stable for discharge from a Psychiatric point of view.  Patient stable for discharge.  Return precautions given.      Santiago Glad, PA-C 09/13/14 2252  Bethann Berkshire, MD 09/13/14 2300

## 2014-09-12 NOTE — Consult Note (Signed)
[facility] Face-to-Face Psychiatry Consult   Reason for Consult: Bizarre behavior, Cocaine intoxication. Referring Physician:  EDP Patient Identification: [name] MRN:  [id number] Principal Diagnosis: Substance or medication-induced bipolar and related disorder with onset during intoxication Diagnosis:   Patient Active Problem List   Diagnosis Date Noted  . 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 Chronic paranoid schizophrenia [F20.0] 09/07/2014  . Suicidal ideations [R45.851] 08/28/2014  . Substance induced mood disorder [F19.94] 08/26/2014  . Schizophrenia [F20.9] 08/11/2014  . Acute CHF (congestive heart failure) [I50.9] 07/29/2014  . Cocaine use disorder, severe, dependence [F14.20]   . Essential hypertension, benign [I10] 03/28/2013  . Diabetes mellitus [E11.9] 03/15/2013    Total Time spent with patient: 1 hour  Subjective:   [name] is a 54 y.o. male patient admitted with Cocaine intoxication.Marland Kitchen  HPI:  [name], 69 well known to the service and was discharged 4 days ago came back for acting bizarre with a c/o bilateral foot pain.  He has a  hx schizophrenia, homelessness, substance abuse, frequent  Visits to the ER.  He was picked up from a restaurant parking lot after using Cocaine and acting bizarre and agitated.   Pt is known for same presentation.  He stated today that he need housing and want the SW to assist him with that.  He has not kept ant appointment with his referred outpatient provider [name].  He is requesting a discharge now and he has an appointment with [name] on Wednesday.  He denies SI/HI/AVH.  He is discharged now to follow up with [facility] on Wednesday.   HPI Elements:   Location:  Cocaine intoxication,substance induced mood disorder. Quality:  severe- moderate. Severity:  moderate. Timing:  acute. Duration:  Chronic substance induced mood  disorder. Context:  Brought in for agitation and bizarre behavior..  Past Medical History:  Past Medical History  Diagnosis Date  . Diabetes mellitus without complication   . Hypertension   . Schizophrenia   . CHF (congestive heart failure)   . Neuropathy   . Polysubstance abuse   . Cocaine abuse   . Homelessness   . HIV (human immunodeficiency virus infection)   . Hepatitis C   . Chronic foot pain    History reviewed. No pertinent past surgical history. Family History:  Family History  Problem Relation Age of Onset  . Hypertension Other   . Diabetes Other    Social History:  History  Alcohol Use  . Yes    Comment: Pt denies     History  Drug Use  . 7.00 per week  . Special: "Crack" cocaine, Cocaine, Marijuana    Comment: Cocaine tonight, Marijuana "a long time"    History   Social History  . Marital Status: Divorced    Spouse Name: N/A  . Number of Children: N/A  . Years of Education: N/A   Social History Main Topics  . Smoking status: Current Every Day Smoker -- 1.00 packs/day for 20 years    Types: Cigarettes  . Smokeless tobacco: Current User  . Alcohol Use: Yes     Comment: Pt denies  . Drug Use: 7.00 per week    Special: "Crack" cocaine, Cocaine, Marijuana     Comment: Cocaine tonight, Marijuana "a long time"  . Sexual Activity: No   Other Topics Concern  . None   Social History Narrative   ** Merged History Encounter **  Additional Social History:    History of alcohol / drug use?: Yes Negative Consequences of Use: Financial Name of Substance 1: Crack Cocaine 1 - Age of First Use: 25 1 - Amount (size/oz): couldn't specify 1 - Frequency: "regularly" 1 - Last Use / Amount: last night $20.00 worth                   Allergies:   Allergies  Allergen Reactions  . Haldol [Haloperidol] Other (See Comments)    Muscle spasms, loss of voluntary movement. However, pt has taken Thorazine on multiple occasions with no adverse effects.      Vitals: Blood pressure 171/84, pulse 88, temperature 98.2 F (36.8 C), temperature source Oral, resp. rate 16, SpO2 100 %.  Risk to Self: Suicidal Ideation: Yes-Currently Present Suicidal Intent: Yes-Currently Present Is patient at risk for suicide?: Yes Suicidal Plan?: Yes-Currently Present Specify Current Suicidal Plan: "jump in front of a bus" Access to Means: Yes Specify Access to Suicidal Means: traffic What has been your use of drugs/alcohol within the last 12 months?: crack cocaine last night Other Self Harm Risks: drug use Triggers for Past Attempts: Unpredictable Intentional Self Injurious Behavior: None Comment - Self Injurious Behavior: N/A Risk to Others: Homicidal Ideation: No Thoughts of Harm to Others: No Comment - Thoughts of Harm to Others: no Current Homicidal Intent: No Current Homicidal Plan: No Describe Current Homicidal Plan: none Access to Homicidal Means: No Describe Access to Homicidal Means: N/A Identified Victim: None History of harm to others?: Yes Assessment of Violence: None Noted Violent Behavior Description: None at this time Does patient have access to weapons?:  (denies) Criminal Charges Pending?: No Does patient have a court date: No Prior Inpatient Therapy: Prior Inpatient Therapy: Yes Prior Therapy Dates: Multiple  Prior Therapy Facilty/Provider(s): Multiple  Reason for Treatment: Psychosis Prior Outpatient Therapy: Prior Outpatient Therapy: Yes Prior Therapy Dates: current Prior Therapy Facilty/Provider(s): [name] Reason for Treatment: med management   Current Facility-Administered Medications  Medication Dose Route Frequency Provider Last Rate Last Dose  . ibuprofen (ADVIL,MOTRIN) tablet 600 mg  600 mg Oral Q6H PRN Gwendloyn Forsee       Current Outpatient Prescriptions  Medication Sig Dispense Refill  . ARIPiprazole (ABILIFY) 10 MG tablet Take 1 tablet (10 mg total) by mouth daily. For mood control 30 tablet 0  . ARIPiprazole  400 MG SUSR Inject 400 mg into the muscle every 30 (thirty) days. (Due to be given on 09-12-14 @ 08:00 am):For mood control 1 each 0  . aspirin 81 MG EC tablet Take 1 tablet (81 mg total) by mouth daily. For heart health 30 tablet 3  . benazepril (LOTENSIN) 5 MG tablet Take 1 tablet (5 mg total) by mouth daily. For high blood pressure 30 tablet 0  . benztropine (COGENTIN) 2 MG tablet Take 1 tablet (2 mg total) by mouth 2 (two) times daily. For prevention of drug induced tremors 60 tablet 0  . cloNIDine (CATAPRES) 0.1 MG tablet Take 1 tablet (0.1 mg total) by mouth 2 (two) times daily. For high blood pressure 60 tablet 0  . furosemide (LASIX) 20 MG tablet Take 1 tablet (20 mg total) by mouth daily. For swellings/high blood pressure 30 tablet 3  . metFORMIN (GLUCOPHAGE) 500 MG tablet Take 1 tablet (500 mg total) by mouth daily with breakfast. For high blood sugar control 30 tablet 0  . tamsulosin (FLOMAX) 0.4 MG CAPS capsule Take 1 capsule (0.4 mg total) by mouth daily after supper. For  prostate health 30 capsule 0  . traZODone (DESYREL) 50 MG tablet Take 1 tablet (50 mg total) by mouth at bedtime as needed for sleep. 30 tablet 0    Musculoskeletal: Strength & Muscle Tone: within normal limits Gait & Station: normal Patient leans: N/A  Psychiatric Specialty Exam:     Blood pressure 171/84, pulse 88, temperature 98.2 F (36.8 C), temperature source Oral, resp. rate 16, SpO2 100 %.There is no weight on file to calculate BMI.  General Appearance: Casual and Disheveled  Eye Contact::  Good  Speech:  Clear and Coherent and Normal Rate  Volume:  Normal  Mood:  Euthymic  Affect:  Congruent  Thought Process:  Coherent, Goal Directed and Intact  Orientation:  Full (Time, Place, and Person)  Thought Content:  WDL  Suicidal Thoughts:  No  Homicidal Thoughts:  No  Memory:  Immediate;   Good Recent;   Good Remote;   Good  Judgement:  Fair  Insight:  Fair  Psychomotor Activity:  Normal   Concentration:  Good  Recall:  NA  Fund of Knowledge:Good  Language: Good  Akathisia:  NA  Handed:  Right  AIMS (if indicated):     Assets:  Desire for Improvement  ADL's:  Impaired  Cognition: WNL  Sleep:      Medical Decision Making: Established Problem, Stable/Improving (1)  Treatment Plan Summary: Plan discharge home  Plan:  discharge Disposition: discharge  Earney Navy   PMHNP-BC 09/12/2014 11:26 AM Patient seen face-to-face for psychiatric evaluation, chart reviewed and case discussed with the physician extender and developed treatment plan. Reviewed the information documented and agree with the treatment plan. Thedore Mins, MD

## 2014-09-12 NOTE — [facility] Suicide Risk Assessment (Cosign Needed)
Suicide Risk Assessment  Discharge Assessment   [facility]   Demographic Factors:  Male, Low socioeconomic status, Living alone and Unemployed  Total Time spent with patient: 20 minutes  Musculoskeletal: Strength & Muscle Tone: within normal limits Gait & Station: normal Patient leans: N/A  Psychiatric Specialty Exam:     Blood pressure 169/91, pulse 87, temperature 99.3 F (37.4 C), temperature source Oral, resp. rate 16, SpO2 100 %.There is no weight on file to calculate BMI.  General Appearance: Casual and Disheveled  Eye Contact::  Good  Speech:  Clear and Coherent and Normal Rate409  Volume:  Normal  Mood:  Euthymic  Affect:  Congruent  Thought Process:  Coherent, Goal Directed and Intact  Orientation:  Full (Time, Place, and Person)  Thought Content:  WDL  Suicidal Thoughts:  No  Homicidal Thoughts:  No  Memory:  Immediate;   Good Recent;   Good Remote;   Good  Judgement:  Fair  Insight:  Fair  Psychomotor Activity:  Normal  Concentration:  Good  Recall:  NA  Fund of Knowledge:Good  Language: Good  Akathisia:  NA  Handed:  Right  AIMS (if indicated):     Assets:  Desire for Improvement Housing  Sleep:     Cognition: WNL  ADL's:  Impaired      Has this patient used any form of tobacco in the last 30 days? (Cigarettes, Smokeless Tobacco, Cigars, and/or Pipes) Yes, A prescription for an FDA-approved tobacco cessation medication was offered at discharge and the patient refused  Mental Status Per Nursing Assessment::   On Admission:     Current Mental Status by Physician: NA  Loss Factors: NA  Historical Factors: NA  Risk Reduction Factors:   Positive social support  Continued Clinical Symptoms:  Depression:   Aggression Alcohol/Substance Abuse/Dependencies  Cognitive Features That Contribute To Risk:  Polarized thinking    Suicide Risk:  Minimal: No identifiable suicidal ideation.  Patients presenting with no  risk factors but with morbid ruminations; may be classified as minimal risk based on the severity of the depressive symptoms  Principal Problem: Substance or medication-induced bipolar and related disorder with onset during intoxication Discharge Diagnoses:  Patient Active Problem List   Diagnosis Date Noted  . 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 Chronic paranoid schizophrenia [F20.0] 09/07/2014  . Suicidal ideations [R45.851] 08/28/2014  . Substance induced mood disorder [F19.94] 08/26/2014  . Schizophrenia [F20.9] 08/11/2014  . Acute CHF (congestive heart failure) [I50.9] 07/29/2014  . Cocaine use disorder, severe, dependence [F14.20]   . Essential hypertension, benign [I10] 03/28/2013  . Diabetes mellitus [E11.9] 03/15/2013      Plan Of Care/Follow-up recommendations:  Activity:  As tolerated Diet:  Regular  Is patient on multiple antipsychotic therapies at discharge:  No   Has Patient had three or more failed trials of antipsychotic monotherapy by history:  No  Recommended Plan for Multiple Antipsychotic Therapies: NA    Bella Brummet C     PMHNP-BC  09/12/2014, 12:15 PM

## 2014-09-12 NOTE — ED Notes (Signed)
Pt transferred to Psych ED from front. Upon arrival pt started demanding food. Limits were set and pt was reminded that the hospital serves three meals a day and he would be provided with breakfast when the other pts get theirs. Continues to endorse SI and AH, Denies VH and HI. Otherwise offered no questions or concerns. Q15 minute checks initiated upon arrival. Will continue current POC.

## 2014-09-12 NOTE — ED Notes (Signed)
Patient wanded by security. Patient has one pair of jeans, one hooded vest, one t-shirt, one pair of black sneakers, one cigarette lighter, and loose change.

## 2014-09-12 NOTE — BH Assessment (Signed)
Assessment Note   [name] is an 54 y.o. male who was brought to [facility] by EMS after "acting bizarre" at a local resturant and complaining of foot pain. He has had multiple visits to the emergency room as well as multiple admissions to behavioral health facilities in the past. Pt currently complains of feeling suicidal and specified a plan to jump in front of a bus to nursing staff this am. He was drowsy this morning during the assessment and kept falling asleep while Clinical research associate was asking questions. He states that he has been smoking crack since he was 25 and used last night. He currently denies HI and audio hallucinations but does admit to seeing thing but he "can't explain it". Pt is requesting admission to [facility] or [facility] so he can "get a boost and some help".  Disposition: Pending   Axis I: 295.90 Schizophrenia, 304.20 cocaine use disorder, severe Axis II: Deferred Axis III:  Past Medical History  Diagnosis Date  . Diabetes mellitus without complication   . Hypertension   . Schizophrenia   . CHF (congestive heart failure)   . Neuropathy   . Polysubstance abuse   . Cocaine abuse   . Homelessness   . HIV (human immunodeficiency virus infection)   . Hepatitis C   . Chronic foot pain    Axis IV: economic problems and other psychosocial or environmental problems Axis V: 31-40 impairment in reality testing  Past Medical History:  Past Medical History  Diagnosis Date  . Diabetes mellitus without complication   . Hypertension   . Schizophrenia   . CHF (congestive heart failure)   . Neuropathy   . Polysubstance abuse   . Cocaine abuse   . Homelessness   . HIV (human immunodeficiency virus infection)   . Hepatitis C   . Chronic foot pain     History reviewed. No pertinent past surgical history.  Family History:  Family History  Problem Relation Age of Onset  . Hypertension Other   . Diabetes Other     Social History:  reports that he has been smoking Cigarettes.   He has a 20 pack-year smoking history. He uses smokeless tobacco. He reports that he drinks alcohol. He reports that he uses illicit drugs ("Crack" cocaine, Cocaine, and Marijuana) about 7 times per week.  Additional Social History:  Alcohol / Drug Use History of alcohol / drug use?: Yes Negative Consequences of Use: Financial Substance #1 Name of Substance 1: Crack Cocaine 1 - Age of First Use: 25 1 - Amount (size/oz): couldn't specify 1 - Frequency: "regularly" 1 - Last Use / Amount: last night $20.00 worth  CIWA: CIWA-Ar BP: 171/84 mmHg Pulse Rate: 88 COWS:    PATIENT STRENGTHS: (choose at least two) Average or above average intelligence General fund of knowledge  Allergies:  Allergies  Allergen Reactions  . Haldol [Haloperidol] Other (See Comments)    Muscle spasms, loss of voluntary movement. However, pt has taken Thorazine on multiple occasions with no adverse effects.     Home Medications:  (Not in a hospital admission)  OB/GYN Status:  No LMP for male patient.  General Assessment Data Location of Assessment: [facility] ED Is this a Tele or Face-to-Face Assessment?: Face-to-Face Is this an Initial Assessment or a Re-assessment for this encounter?: Initial Assessment Living Arrangements: Alone Can pt return to current living arrangement?: Yes Admission Status: Voluntary Is patient capable of signing voluntary admission?: Yes Transfer from: Other (Comment) ([employer]) Referral Source: Self/Family/Friend  [facility] Crisis Care Plan Living Arrangements: Alone Name of Psychiatrist: [name] Name of Therapist: None  Education Status Is patient currently in school?: No Highest grade of school patient has completed: 12  Risk to self with the past 6 months Suicidal Ideation: Yes-Currently Present Suicidal Intent: Yes-Currently Present Is patient at risk for suicide?: Yes Suicidal Plan?: Yes-Currently Present Specify Current Suicidal Plan: "jump in front of a  bus" Access to Means: Yes Specify Access to Suicidal Means: traffic What has been your use of drugs/alcohol within the last 12 months?: crack cocaine last night Previous Attempts/Gestures: Yes Other Self Harm Risks: drug use Triggers for Past Attempts: Unpredictable Intentional Self Injurious Behavior: None Comment - Self Injurious Behavior: N/A Family Suicide History: No Recent stressful life event(s): Financial Problems, Other (Comment) (drug use) Persecutory voices/beliefs?: No Depression: Yes Depression Symptoms: Despondent Substance abuse history and/or treatment for substance abuse?: Yes Suicide prevention information given to non-admitted patients: Not applicable  Risk to Others within the past 6 months Homicidal Ideation: No Thoughts of Harm to Others: No Comment - Thoughts of Harm to Others: no Current Homicidal Intent: No Current Homicidal Plan: No Describe Current Homicidal Plan: none Access to Homicidal Means: No Describe Access to Homicidal Means: N/A Identified Victim: None History of harm to others?: Yes Assessment of Violence: None Noted Violent Behavior Description: None at this time Does patient have access to weapons?:  (denies) Criminal Charges Pending?: No Does patient have a court date: No  Psychosis Hallucinations: Visual Delusions: Unspecified  Mental Status Report Appear/Hygiene: Disheveled, In scrubs Eye Contact: Poor Motor Activity: Freedom of movement Speech: Logical/coherent Level of Consciousness: Drowsy Mood: Sad Affect: Blunted Anxiety Level: Minimal Thought Processes: Tangential Judgement: Partial Orientation: Person, Place, Time, Situation Obsessive Compulsive Thoughts/Behaviors: None  Cognitive Functioning Concentration: Decreased Memory: Recent Intact, Remote Intact IQ: Average Insight: Fair Impulse Control: Fair Appetite: Fair Weight Loss: 0 Weight Gain: 0 Sleep: Decreased Vegetative Symptoms: Decreased  grooming  ADLScreening ([facility] Assessment Services) Patient's cognitive ability adequate to safely complete daily activities?: Yes Patient able to express need for assistance with ADLs?: Yes Independently performs ADLs?: Yes (appropriate for developmental age)  Prior Inpatient Therapy Prior Inpatient Therapy: Yes Prior Therapy Dates: Multiple  Prior Therapy Facilty/Provider(s): Multiple  Reason for Treatment: Psychosis  Prior Outpatient Therapy Prior Outpatient Therapy: Yes Prior Therapy Dates: current Prior Therapy Facilty/Provider(s): [name] Reason for Treatment: med management   ADL Screening (condition at time of admission) Patient's cognitive ability adequate to safely complete daily activities?: Yes Is the patient deaf or have difficulty hearing?: No Does the patient have difficulty seeing, even when wearing glasses/contacts?: No Does the patient have difficulty concentrating, remembering, or making decisions?: Yes Patient able to express need for assistance with ADLs?: Yes Does the patient have difficulty dressing or bathing?: No Independently performs ADLs?: Yes (appropriate for developmental age) Does the patient have difficulty walking or climbing stairs?: Yes Weakness of Legs: Both Weakness of Arms/Hands: None  Home Assistive Devices/Equipment Home Assistive Devices/Equipment: None    Abuse/Neglect Assessment (Assessment to be complete while patient is alone) Physical Abuse:  (UTA) Verbal Abuse:  (UTA) Sexual Abuse:  (UTA) Exploitation of patient/patient's resources:  (UTA) Self-Neglect:  (UTa) Possible abuse reported to::  (UTA) Values / Beliefs Cultural Requests During Hospitalization: None Spiritual Requests During Hospitalization: None Consults Spiritual Care Consult Needed: No Advance Directives (For Healthcare) Does patient have an advance directive?: No Would patient like information on creating an advanced directive?: No - patient declined  information  Additional Information 1:1 In Past 12 Months?: No CIRT Risk: No Elopement Risk: No Does patient have medical clearance?: Yes     Disposition:  Disposition Initial Assessment Completed for this Encounter: Yes Disposition of Patient: Other dispositions  Valina Maes 09/12/2014 7:45 AM \

## 2014-09-12 NOTE — ED Notes (Signed)
Patient arrived via EMS with complaints of bilateral foot pain.  History of neuropathy.

## 2014-09-12 NOTE — Progress Notes (Signed)
  CARE MANAGEMENT ED NOTE 09/12/2014  Patient:  [name],[name]   Account Number:  [id number]  Date Initiated:  09/12/2014  Documentation initiated by:  Edd ArbourGIBBS,Jalonda Antigua  Subjective/Objective Assessment:   54 yr old medicare part [place] pt c/o bilateral foot pain dx 295.90 Schizophrenia, 304.20 cocaine use disorder, severe     Subjective/Objective Assessment Detail:   Pt with 37 ED visits Cm inquired about 37 ED visits Pt states he does not want to stay where is presently lives  Pt noted as on previously ED visit eating graham crackers and peanute butter at [employer] with trash on floor  Pt tells CM when offered 6 page list of medic[place] providers [place] [address] that he "won't be living much longer"  " I keep doing cocaine"  "I can get a doctor but then you have to pay them money"     Action/Plan:   CM provided this pt with a list of [place] self pay resources on 09/07/14 Cm presently offered pt 6 page list of medicare providers within [place] [address] Discussed frequent visits to ED does not guarantee he will be placed in another home   Action/Plan Detail:   pther than his present home   Anticipated DC Date:       Status Recommendation to Physician:   Result of Recommendation:    Other ED Services  Consult Working Plan    DC Associate Professorlanning Services  Other  Outpatient Services - Pt will follow up  PCP issues    Choice offered to / List presented to:            Status of service:  Completed, signed off  ED Comments:   ED Comments Detail:

## 2014-09-13 DIAGNOSIS — G8929 Other chronic pain: Secondary | ICD-10-CM | POA: Insufficient documentation

## 2014-09-13 DIAGNOSIS — Z720 Tobacco use: Secondary | ICD-10-CM | POA: Insufficient documentation

## 2014-09-13 DIAGNOSIS — Z7982 Long term (current) use of aspirin: Secondary | ICD-10-CM | POA: Insufficient documentation

## 2014-09-13 DIAGNOSIS — Z8619 Personal history of other infectious and parasitic diseases: Secondary | ICD-10-CM | POA: Insufficient documentation

## 2014-09-13 DIAGNOSIS — Z590 Homelessness: Secondary | ICD-10-CM | POA: Insufficient documentation

## 2014-09-13 DIAGNOSIS — Z21 Asymptomatic human immunodeficiency virus [HIV] infection status: Secondary | ICD-10-CM | POA: Insufficient documentation

## 2014-09-13 DIAGNOSIS — Z79899 Other long term (current) drug therapy: Secondary | ICD-10-CM | POA: Insufficient documentation

## 2014-09-13 DIAGNOSIS — R45851 Suicidal ideations: Secondary | ICD-10-CM | POA: Insufficient documentation

## 2014-09-13 DIAGNOSIS — Z8669 Personal history of other diseases of the nervous system and sense organs: Secondary | ICD-10-CM | POA: Insufficient documentation

## 2014-09-13 DIAGNOSIS — I509 Heart failure, unspecified: Secondary | ICD-10-CM | POA: Insufficient documentation

## 2014-09-13 DIAGNOSIS — I10 Essential (primary) hypertension: Secondary | ICD-10-CM | POA: Insufficient documentation

## 2014-09-13 DIAGNOSIS — E119 Type 2 diabetes mellitus without complications: Secondary | ICD-10-CM | POA: Insufficient documentation

## 2014-09-14 ENCOUNTER — Encounter (HOSPITAL_COMMUNITY): Payer: Self-pay | Admitting: Emergency Medicine

## 2014-09-14 ENCOUNTER — Emergency Department (HOSPITAL_COMMUNITY)
Admission: EM | Admit: 2014-09-14 | Discharge: 2014-09-14 | Disposition: A | Discharge status: Home / Self Care | Payer: Medicare Other | Attending: Emergency Medicine | Admitting: Emergency Medicine

## 2014-09-14 DIAGNOSIS — R45851 Suicidal ideations: Secondary | ICD-10-CM

## 2014-09-14 DIAGNOSIS — F1994 Other psychoactive substance use, unspecified with psychoactive substance-induced mood disorder: Secondary | ICD-10-CM

## 2014-09-14 DIAGNOSIS — F1420 Cocaine dependence, uncomplicated: Secondary | ICD-10-CM | POA: Diagnosis present

## 2014-09-14 DIAGNOSIS — F209 Schizophrenia, unspecified: Secondary | ICD-10-CM

## 2014-09-14 LAB — COMPREHENSIVE METABOLIC PANEL
ALBUMIN: 3.4 g/dL — AB (ref 3.5–5.2)
ALT: 15 U/L (ref 0–53)
ANION GAP: 6 (ref 5–15)
AST: 19 U/L (ref 0–37)
Alkaline Phosphatase: 92 U/L (ref 39–117)
BUN: 17 mg/dL (ref 6–23)
CO2: 26 mmol/L (ref 19–32)
Calcium: 8.4 mg/dL (ref 8.4–10.5)
Chloride: 107 mmol/L (ref 96–112)
Creatinine, Ser: 0.95 mg/dL (ref 0.50–1.35)
Glucose, Bld: 175 mg/dL — ABNORMAL HIGH (ref 70–99)
POTASSIUM: 3.7 mmol/L (ref 3.5–5.1)
Sodium: 139 mmol/L (ref 135–145)
TOTAL PROTEIN: 6.1 g/dL (ref 6.0–8.3)
Total Bilirubin: 0.6 mg/dL (ref 0.3–1.2)

## 2014-09-14 LAB — ACETAMINOPHEN LEVEL: Acetaminophen (Tylenol), Serum: <10 ug/mL — ABNORMAL LOW (ref 10–30)

## 2014-09-14 LAB — CBC WITH DIFFERENTIAL/PLATELET
BASOS ABS: 0 10*3/uL (ref 0.0–0.1)
BASOS PCT: 1 % (ref 0–1)
Eosinophils Absolute: 0.2 10*3/uL (ref 0.0–0.7)
Eosinophils Relative: 3 % (ref 0–5)
HCT: 38.3 % — ABNORMAL LOW (ref 39.0–52.0)
Hemoglobin: 11.7 g/dL — ABNORMAL LOW (ref 13.0–17.0)
Lymphocytes Relative: 23 % (ref 12–46)
Lymphs Abs: 1.3 10*3/uL (ref 0.7–4.0)
MCH: 27.9 pg (ref 26.0–34.0)
MCHC: 30.5 g/dL (ref 30.0–36.0)
MCV: 91.2 fL (ref 78.0–100.0)
Monocytes Absolute: 0.6 10*3/uL (ref 0.1–1.0)
Monocytes Relative: 10 % (ref 3–12)
NEUTROS ABS: 3.7 10*3/uL (ref 1.7–7.7)
NEUTROS PCT: 63 % (ref 43–77)
PLATELETS: 239 10*3/uL (ref 150–400)
RBC: 4.2 MIL/uL — ABNORMAL LOW (ref 4.22–5.81)
RDW: 14.7 % (ref 11.5–15.5)
WBC: 5.8 10*3/uL (ref 4.0–10.5)

## 2014-09-14 LAB — SALICYLATE LEVEL

## 2014-09-14 LAB — ETHANOL

## 2014-09-14 MED ORDER — TRAZODONE HCL 50 MG PO TABS: 50.0000 mg | ORAL_TABLET | Freq: Every evening | ORAL | Status: DC | PRN

## 2014-09-14 MED ORDER — METFORMIN HCL 500 MG PO TABS
500.0000 mg | ORAL_TABLET | Freq: Every day | ORAL | Status: DC
  Filled 2014-09-14 (×2): qty 1

## 2014-09-14 MED ORDER — ARIPIPRAZOLE 10 MG PO TABS
10.0000 mg | ORAL_TABLET | Freq: Every day | ORAL | Status: DC
  Filled 2014-09-14: qty 1

## 2014-09-14 MED ORDER — CLONIDINE HCL 0.1 MG PO TABS: 0.1000 mg | ORAL_TABLET | Freq: Two times a day (BID) | ORAL | Status: DC

## 2014-09-14 MED ORDER — BENAZEPRIL HCL 5 MG PO TABS
5.0000 mg | ORAL_TABLET | Freq: Every day | ORAL | Status: DC
  Filled 2014-09-14: qty 1

## 2014-09-14 MED ORDER — BENZTROPINE MESYLATE 1 MG PO TABS: 2.0000 mg | ORAL_TABLET | Freq: Two times a day (BID) | ORAL | Status: DC

## 2014-09-14 NOTE — ED Notes (Signed)
Patient alert and resting at this time. NAD noted.

## 2014-09-14 NOTE — ED Notes (Signed)
Pt asking for crackers and peanut butter; registration staff provided food to pt; pt was alos able to get [employer] out of the vending machine; pt ambulatory in no acute distress

## 2014-09-14 NOTE — ED Provider Notes (Signed)
CSN: [id number]     Arrival date & time 09/13/14  2359 History   First MD Initiated Contact with Patient 09/14/14 [phone]     Chief Complaint  Patient presents with  . Hypertension     (Consider location/radiation/quality/duration/timing/severity/associated sxs/prior Treatment) The history is provided by the patient.  [name] is a 54 y.o. male hx of DM, HTN, schizophrenia, HIV, polysubstance abuse, homeless, here presenting with suicidal ideations. He meal last night because he needed a place to stay. He also was complaining of high blood pressure but he wasn't taking his blood pressure medicine. He has been seen multiple times by behavior health, most recently was 2 days ago. When asked about suicidal ideations, claims that he has thoughts of harming himself but mainly just doesn't want to go back to the streets. He is demanding to be seen by behavioral health today.   Past Medical History  Diagnosis Date  . Diabetes mellitus without complication   . Hypertension   . Schizophrenia   . CHF (congestive heart failure)   . Neuropathy   . Polysubstance abuse   . Cocaine abuse   . Homelessness   . HIV (human immunodeficiency virus infection)   . Hepatitis C   . Chronic foot pain    History reviewed. No pertinent past surgical history. Family History  Problem Relation Age of Onset  . Hypertension Other   . Diabetes Other    History  Substance Use Topics  . Smoking status: Current Every Day Smoker -- 1.00 packs/day for 20 years    Types: Cigarettes  . Smokeless tobacco: Current User  . Alcohol Use: Yes     Comment: Pt denies    Review of Systems  Psychiatric/Behavioral: Positive for suicidal ideas.  All other systems reviewed and are negative.     Allergies  Haldol  Home Medications   Prior to Admission medications   Medication Sig Start Date End Date Taking? Authorizing Provider  ARIPiprazole (ABILIFY) 10 MG tablet Take 1 tablet (10 mg total) by mouth  daily. For mood control 08/12/14   Sanjuana Kava, NP  ARIPiprazole 400 MG SUSR Inject 400 mg into the muscle every 30 (thirty) days. (Due to be given on 09-12-14 @ 08:00 am):For mood control 09/12/14   Sanjuana Kava, NP  aspirin 81 MG EC tablet Take 1 tablet (81 mg total) by mouth daily. For heart health 08/12/14   Sanjuana Kava, NP  benazepril (LOTENSIN) 5 MG tablet Take 1 tablet (5 mg total) by mouth daily. For high blood pressure 08/12/14   Sanjuana Kava, NP  benztropine (COGENTIN) 2 MG tablet Take 1 tablet (2 mg total) by mouth 2 (two) times daily. For prevention of drug induced tremors 08/12/14   Sanjuana Kava, NP  cloNIDine (CATAPRES) 0.1 MG tablet Take 1 tablet (0.1 mg total) by mouth 2 (two) times daily. For high blood pressure 08/12/14   Sanjuana Kava, NP  furosemide (LASIX) 20 MG tablet Take 1 tablet (20 mg total) by mouth daily. For swellings/high blood pressure 08/12/14   Sanjuana Kava, NP  metFORMIN (GLUCOPHAGE) 500 MG tablet Take 1 tablet (500 mg total) by mouth daily with breakfast. For high blood sugar control 08/12/14   Sanjuana Kava, NP  tamsulosin (FLOMAX) 0.4 MG CAPS capsule Take 1 capsule (0.4 mg total) by mouth daily after supper. For prostate health 08/12/14   Sanjuana Kava, NP  traZODone (DESYREL) 50 MG tablet Take 1 tablet (50 mg  total) by mouth at bedtime as needed for sleep. 08/12/14   Sanjuana KavaAgnes I Nwoko, NP   BP 175/92 mmHg  Pulse 94  Temp(Src) 98.4 F (36.9 C) (Oral)  Resp 18  SpO2 98% Physical Exam  Constitutional: He is oriented to person, place, and time.  Disheveled   HENT:  Head: Normocephalic.  Mouth/Throat: Oropharynx is clear and moist.  Eyes: Conjunctivae are normal. Pupils are equal, round, and reactive to light.  Neck: Normal range of motion. Neck supple.  Cardiovascular: Normal rate, regular rhythm and normal heart sounds.   Pulmonary/Chest: Effort normal and breath sounds normal. No respiratory distress. He has no wheezes. He has no rales.  Abdominal: Soft. Bowel  sounds are normal. He exhibits no distension. There is no tenderness. There is no rebound.  Musculoskeletal: Normal range of motion. He exhibits no edema or tenderness.  Neurological: He is alert and oriented to person, place, and time. No cranial nerve deficit. Coordination normal.  Skin: Skin is warm and dry.  Psychiatric:  Anxious, poor judgement   Nursing note and vitals reviewed.   ED Course  Procedures (including critical care time) Labs Review Labs Reviewed  CBC WITH DIFFERENTIAL/PLATELET - Abnormal; Notable for the following:    RBC 4.20 (*)    Hemoglobin 11.7 (*)    HCT 38.3 (*)    All other components within normal limits  COMPREHENSIVE METABOLIC PANEL - Abnormal; Notable for the following:    Glucose, Bld 175 (*)    Albumin 3.4 (*)    All other components within normal limits  ACETAMINOPHEN LEVEL - Abnormal; Notable for the following:    Acetaminophen (Tylenol), Serum <10.0 (*)    All other components within normal limits  ETHANOL  SALICYLATE LEVEL    Imaging Review No results found.   EKG Interpretation None      MDM   Final diagnoses:  Substance induced mood disorder  Suicidal ideations  Schizophrenia, unspecified type  Cocaine use disorder, severe, dependence   [name] is a 54 y.o. male here with suicidal ideation. I think likely just homeless and doesn't want to go back to the streets. I don't feel that he needs TTS but patient states that he will kill himself if he doesn't see behavioral health. Will consult TTS but I think likely will be cleared by them and just has mainly social issues that keeps him coming to the ED.   9:20 AM  Labs at baseline. [facility] saw patient. Patient can go to stay with family. Behavioral health to discharge patient   Richardean Canalavid H Merl Bommarito, MD 09/14/14 [phone]

## 2014-09-14 NOTE — Discharge Instructions (Signed)
For your ongoing mental health needs, you are advised to follow up with [name].  You have an appointment scheduled with Dr Theola SequinAlamu on Wednesday, 09/14/2014 at 2:00 pm:       [name]      [address]      [place], [place] [id number]      [phone]

## 2014-09-14 NOTE — ED Notes (Signed)
Breakfast tray given. °

## 2014-09-14 NOTE — Consult Note (Signed)
[facility] Face-to-Face Psychiatry Consult   Reason for Consult:  Cocaine abuse Referring Physician:  EDP Patient Identification: [name] MRN:  [id number] Principal Diagnosis: Cocaine use disorder, severe, dependence Diagnosis:   Patient Active Problem List   Diagnosis Date Noted  . Suicidal ideations [R45.851] 08/28/2014    Priority: High  . Substance induced mood disorder [F19.94] 08/26/2014    Priority: High  . Schizophrenia [F20.9] 08/11/2014    Priority: High  . Cocaine use disorder, severe, dependence [F14.20]     Priority: High  . Chronic paranoid schizophrenia [F20.0] 09/07/2014  . 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 Acute CHF (congestive heart failure) [I50.9] 07/29/2014  . Essential hypertension, benign [I10] 03/28/2013  . Diabetes mellitus [E11.9] 03/15/2013    Total Time spent with patient: 45 minutes  Subjective:   [name] is a 54 y.o. male patient does not warrant admission.  HPI:  The patient used cocaine last night and came to the ED stating he was suicidal, homicidal, and having hallucinations.  This morning he was singing he wanted to go to [facility] and the food buffet.  Patient is not hallucinating, denies suicidal/homicidal ideations.  He is well-known to this facility and providers, 38 times in 6 months, four times in last week.  [name] does not follow-up with appointments or take medications.  He does have an appointment at [facility] at 2 pm today, bus ticket given. HPI Elements:   Location:  generalized . Quality:  acute. Severity:  mild. Timing:  intermittent. Duration:  brief. Context:  cocaine abuse.  Past Medical History:  Past Medical History  Diagnosis Date  . Diabetes mellitus without complication   . Hypertension   . Schizophrenia   . CHF (congestive heart failure)   . Neuropathy   . Polysubstance abuse   . Cocaine abuse   . Homelessness   . HIV  (human immunodeficiency virus infection)   . Hepatitis C   . Chronic foot pain    History reviewed. No pertinent past surgical history. Family History:  Family History  Problem Relation Age of Onset  . Hypertension Other   . Diabetes Other    Social History:  History  Alcohol Use  . Yes    Comment: Pt denies     History  Drug Use  . 7.00 per week  . Special: "Crack" cocaine, Cocaine, Marijuana    Comment: Cocaine tonight, Marijuana "a long time"    History   Social History  . Marital Status: Divorced    Spouse Name: N/A  . Number of Children: N/A  . Years of Education: N/A   Social History Main Topics  . Smoking status: Current Every Day Smoker -- 1.00 packs/day for 20 years    Types: Cigarettes  . Smokeless tobacco: Current User  . Alcohol Use: Yes     Comment: Pt denies  . Drug Use: 7.00 per week    Special: "Crack" cocaine, Cocaine, Marijuana     Comment: Cocaine tonight, Marijuana "a long time"  . Sexual Activity: No   Other Topics Concern  . None   Social History Narrative   ** Merged History Encounter **       Additional Social History:    Pain Medications: SEE MAR Prescriptions: SEE MAR Over the Counter: SEE MAR Name of Substance 1: Crack Cocaine  1 - Age of First Use: 54 yrs old  1 - Amount (size/oz): 1 gram 1 - Frequency:  daily  1 - Duration: on-going  1 - Last Use / Amount: 1 gram                    Allergies:   Allergies  Allergen Reactions  . Haldol [Haloperidol] Other (See Comments)    Muscle spasms, loss of voluntary movement. However, pt has taken Thorazine on multiple occasions with no adverse effects.     Vitals: Blood pressure 175/92, pulse 94, temperature 98.4 F (36.9 C), temperature source Oral, resp. rate 18, SpO2 98 %.  Risk to Self: Suicidal Ideation: Yes-Currently Present Suicidal Intent: Yes-Currently Present Is patient at risk for suicide?: Yes Suicidal Plan?: Yes-Currently Present Specify Current Suicidal  Plan:  ("jump off a bridge") Access to Means: Yes Specify Access to Suicidal Means:  (traffic ) What has been your use of drugs/alcohol within the last 12 months?:  (crack cocaine) Other Self Harm Risks:  (drug use ) Triggers for Past Attempts: Unpredictable Intentional Self Injurious Behavior: None Comment - Self Injurious Behavior:  (n/a) Risk to Others: Homicidal Ideation: No Thoughts of Harm to Others: No Comment - Thoughts of Harm to Others:  (n/a) Current Homicidal Intent: No-Not Currently/Within Last 6 Months Current Homicidal Plan: No Describe Current Homicidal Plan:  (none ) Access to Homicidal Means: No Describe Access to Homicidal Means:  (n/a) Identified Victim:  (none ) History of harm to others?: Yes Assessment of Violence: None Noted Violent Behavior Description:  (None at this time) Does patient have access to weapons?: No Criminal Charges Pending?: No Does patient have a court date: No Prior Inpatient Therapy: Prior Inpatient Therapy: Yes Prior Therapy Dates: Multiple  Prior Therapy Facilty/Provider(s): Multiple  Reason for Treatment: Psychosis Prior Outpatient Therapy: Prior Outpatient Therapy: Yes Prior Therapy Dates: current Prior Therapy Facilty/Provider(s): [name] Reason for Treatment: med management   No current facility-administered medications for this encounter.   Current Outpatient Prescriptions  Medication Sig Dispense Refill  . ARIPiprazole (ABILIFY) 10 MG tablet Take 1 tablet (10 mg total) by mouth daily. For mood control 30 tablet 0  . ARIPiprazole 400 MG SUSR Inject 400 mg into the muscle every 30 (thirty) days. (Due to be given on 09-12-14 @ 08:00 am):For mood control 1 each 0  . aspirin 81 MG EC tablet Take 1 tablet (81 mg total) by mouth daily. For heart health 30 tablet 3  . benazepril (LOTENSIN) 5 MG tablet Take 1 tablet (5 mg total) by mouth daily. For high blood pressure 30 tablet 0  . benztropine (COGENTIN) 2 MG tablet Take 1 tablet (2  mg total) by mouth 2 (two) times daily. For prevention of drug induced tremors 60 tablet 0  . cloNIDine (CATAPRES) 0.1 MG tablet Take 1 tablet (0.1 mg total) by mouth 2 (two) times daily. For high blood pressure 60 tablet 0  . furosemide (LASIX) 20 MG tablet Take 1 tablet (20 mg total) by mouth daily. For swellings/high blood pressure 30 tablet 3  . metFORMIN (GLUCOPHAGE) 500 MG tablet Take 1 tablet (500 mg total) by mouth daily with breakfast. For high blood sugar control 30 tablet 0  . tamsulosin (FLOMAX) 0.4 MG CAPS capsule Take 1 capsule (0.4 mg total) by mouth daily after supper. For prostate health 30 capsule 0  . traZODone (DESYREL) 50 MG tablet Take 1 tablet (50 mg total) by mouth at bedtime as needed for sleep. 30 tablet 0    Musculoskeletal: Strength & Muscle Tone: within normal limits Gait & Station: normal Patient leans: N/A  Psychiatric Specialty Exam:     Blood pressure 175/92, pulse 94, temperature 98.4 F (36.9 C), temperature source Oral, resp. rate 18, SpO2 98 %.There is no weight on file to calculate BMI.  General Appearance: Casual  Eye Contact::  Good  Speech:  Normal Rate  Volume:  Normal  Mood:  Euthymic  Affect:  Congruent  Thought Process:  Coherent  Orientation:  Full (Time, Place, and Person)  Thought Content:  WDL  Suicidal Thoughts:  No  Homicidal Thoughts:  No  Memory:  Immediate;   Good Recent;   Good Remote;   Good  Judgement:  Fair  Insight:  Fair  Psychomotor Activity:  Normal  Concentration:  Good  Recall:  Good  Fund of Knowledge:Fair  Language: Fair  Akathisia:  No  Handed:  Right  AIMS (if indicated):     Assets:  Leisure Time Physical Health Resilience  ADL's:  Intact  Cognition: WNL  Sleep:      Medical Decision Making: Review of Psycho-Social Stressors (1), Review or order clinical lab tests (1) and Review of Medication Regimen & Side Effects (2)  Treatment Plan Summary: Daily contact with patient to assess and evaluate  symptoms and progress in treatment, Medication management and Plan discharge home, [name] appointment at 2 pm  Plan:  No evidence of imminent risk to self or others at present.   Disposition: Discharge home, [name] appointment at 2 pm  Nanine MeansLORD, JAMISON, PMH-NP 09/14/2014 2:21 PM Patient seen face-to-face for psychiatric evaluation, chart reviewed and case discussed with the physician extender and developed treatment plan. Reviewed the information documented and agree with the treatment plan. Thedore MinsMojeed Ewel Lona, MD

## 2014-09-14 NOTE — ED Notes (Signed)
Pt states that he wants to get checked out because he 'doesn't feel right' after smoking crack. Schizophrenic. Smoked 3 rocks and is now hypertensive. Alert, oriented, ambulatory.

## 2014-09-14 NOTE — ED Notes (Signed)
Pt sleeping in lobby in no acute distress.  

## 2014-09-14 NOTE — BH Assessment (Addendum)
Assessment Note  [name] is an 54 y.o. male with history of Schizophrenia and Polysubstance abuse.  Pt is known to this Clinical research associatewriter, and ED staff.  He has presented to the ED 37+ times in the past 6 months. [facility]. Patient however admits that he doesn't follow up. Sts, "The drugs keep me from going". He reports he may now have an ACT team but is not sure who he provider is. He says [facility], and has since been in jail, and got some medications while there.  Patient reports feeling suicidal and paranoid. Pt reports he has suicidal ideations with onset 3 days ago. He says he is feeing suicidal and has tried to "bang his head against the wall" in the past to kill himself. He reports he puts himself in danger walking up to cars begging for money. Patient also has thoughts to jump off a bridge. Stressors: He reports he is "kinda" homeless. Pt [place] but chooses to stay on the street. He says he is paranoid and hearing voices and "needs his shot" and to get back on his medications. He says he has not had them in a couple of weeks.   He says he uses cocaine, and has been using for the past several wees with his last use being last night. He says [facility], and has since been in jail, and got some medications while there.  During assessment pt is alert and oriented times 3. He does not appear able to adequately determine the situation. Speech is rapid and pressured. Judgement impaired. Mood is anxious, irritable, and depressed with labile affect. Pt is restless throughout and keep putting his hand down his pants.   Axis I: 295.50 Schizophrenia Bipolar Type 300.00 Unspecified Anxiety Disorder 304.20 Cocaine Use Disorder, Severe  Axis II: Deferred Axis III:  Past Medical History  Diagnosis Date  . Diabetes mellitus without complication    . Hypertension   . Schizophrenia   . CHF (congestive heart failure)   . Neuropathy   . Polysubstance abuse   . Cocaine abuse   . Homelessness   . HIV (human immunodeficiency virus infection)   . Hepatitis C   . Chronic foot pain    Axis IV: economic problems, housing problems, other psychosocial or environmental problems, problems with access to health care services and problems with primary support group Axis V: 21-30 behavior considerably influenced by delusions or hallucinations OR serious impairment in judgment, communication OR inability to function in almost all areas        Past Medical History:  Past Medical History  Diagnosis Date  . Diabetes mellitus without complication   . Hypertension   . Schizophrenia   . CHF (congestive heart failure)   . Neuropathy   . Polysubstance abuse   . Cocaine abuse   . Homelessness   . HIV (human immunodeficiency virus infection)   . Hepatitis C   . Chronic foot pain     History reviewed. No pertinent past surgical history.  Family History:  Family History  Problem Relation Age of Onset  . Hypertension Other   . Diabetes Other     Social History:  reports that he has been smoking Cigarettes.  He has a 20 pack-year smoking history. He uses smokeless tobacco. He reports that he drinks alcohol. He reports that he  uses illicit drugs ("Crack" cocaine, Cocaine, and Marijuana) about 7 times per week.  Additional Social History:  Alcohol / Drug Use Pain Medications: SEE MAR Prescriptions: SEE MAR Over the Counter: SEE MAR Substance #1 Name of Substance 1: Crack Cocaine  1 - Age of First Use: 54 yrs old  1 - Amount (size/oz): 1 gram 1 - Frequency: daily  1 - Duration: on-going  1 - Last Use / Amount: 1 gram   CIWA: CIWA-Ar BP: 161/87 mmHg Pulse Rate: 93 COWS:    Allergies:  Allergies  Allergen Reactions  . Haldol [Haloperidol] Other (See Comments)    Muscle spasms, loss of voluntary  movement. However, pt has taken Thorazine on multiple occasions with no adverse effects.     Home Medications:  (Not in a hospital admission)  OB/GYN Status:  No LMP for male patient.  General Assessment Data Location of Assessment: [facility] ED Is this a Tele or Face-to-Face Assessment?: Face-to-Face Is this an Initial Assessment or a Re-assessment for this encounter?: Initial Assessment Living Arrangements: Alone Can pt return to current living arrangement?: Yes Admission Status: Voluntary Is patient capable of signing voluntary admission?: Yes Transfer from: Acute Hospital Referral Source: Self/Family/Friend     [facility] Crisis Care Plan Living Arrangements: Alone Name of Psychiatrist: [name] Name of Therapist: None  Education Status Is patient currently in school?: No Highest grade of school patient has completed: 12  Risk to self with the past 6 months Suicidal Ideation: Yes-Currently Present Suicidal Intent: Yes-Currently Present Is patient at risk for suicide?: Yes Suicidal Plan?: Yes-Currently Present Specify Current Suicidal Plan:  ("jump off a bridge") Access to Means: Yes Specify Access to Suicidal Means:  (traffic ) What has been your use of drugs/alcohol within the last 12 months?:  (crack cocaine) Previous Attempts/Gestures: Yes Other Self Harm Risks:  (drug use ) Triggers for Past Attempts: Unpredictable Intentional Self Injurious Behavior: None Comment - Self Injurious Behavior:  (n/a) Family Suicide History: No Recent stressful life event(s): Other (Comment) (drug use ) Persecutory voices/beliefs?: No Depression: Yes Depression Symptoms: Despondent Substance abuse history and/or treatment for substance abuse?: Yes Suicide prevention information given to non-admitted patients: Not applicable  Risk to Others within the past 6 months Homicidal Ideation: No Thoughts of Harm to Others: No Comment - Thoughts of Harm to Others:  (n/a) Current Homicidal Intent:  No-Not Currently/Within Last 6 Months Current Homicidal Plan: No Describe Current Homicidal Plan:  (none ) Access to Homicidal Means: No Describe Access to Homicidal Means:  (n/a) Identified Victim:  (none ) History of harm to others?: Yes Assessment of Violence: None Noted Violent Behavior Description:  (None at this time) Does patient have access to weapons?: No Criminal Charges Pending?: No Does patient have a court date: No  Psychosis Hallucinations: Visual Delusions: Unspecified  Mental Status Report Appear/Hygiene: Disheveled, In scrubs Eye Contact: Poor Motor Activity: Freedom of movement Speech: Logical/coherent Level of Consciousness: Drowsy Mood: Other (Comment) (Sad) Affect: Blunted Anxiety Level: Minimal Thought Processes: Tangential Judgement: Partial Orientation: Person, Place, Time, Situation Obsessive Compulsive Thoughts/Behaviors: None  Cognitive Functioning Concentration: Decreased Memory: Recent Intact, Remote Intact IQ: Average Insight: Fair Impulse Control: Fair Appetite: Fair Weight Loss:  (0) Weight Gain: 0 Sleep: Decreased Vegetative Symptoms: Decreased grooming  ADLScreening [facility]) Patient's cognitive ability adequate to safely complete daily activities?: Yes Patient able to express need for assistance with ADLs?: Yes Independently performs ADLs?: Yes (appropriate for developmental age)  Prior Inpatient Therapy Prior Inpatient Therapy: Yes Prior Therapy  Dates: Multiple  Prior Therapy Facilty/Provider(s): Multiple  Reason for Treatment: Psychosis  Prior Outpatient Therapy Prior Outpatient Therapy: Yes Prior Therapy Dates: current Prior Therapy Facilty/Provider(s): [name] Reason for Treatment: med management   ADL Screening (condition at time of admission) Patient's cognitive ability adequate to safely complete daily activities?: Yes Is the patient deaf or have difficulty hearing?: No Does the patient have  difficulty seeing, even when wearing glasses/contacts?: No Does the patient have difficulty concentrating, remembering, or making decisions?: Yes Patient able to express need for assistance with ADLs?: Yes Does the patient have difficulty dressing or bathing?: No Independently performs ADLs?: Yes (appropriate for developmental age) Does the patient have difficulty walking or climbing stairs?: No Weakness of Legs: None Weakness of Arms/Hands: None  Home Assistive Devices/Equipment Home Assistive Devices/Equipment: None    Abuse/Neglect Assessment (Assessment to be complete while patient is alone) Physical Abuse: Denies Verbal Abuse: Denies Sexual Abuse: Denies Exploitation of patient/patient's resources: Denies Self-Neglect: Denies Values / Beliefs Cultural Requests During Hospitalization: None Spiritual Requests During Hospitalization: None   Advance Directives (For Healthcare) Does patient have an advance directive?: No    Additional Information 1:1 In Past 12 Months?: No CIRT Risk: No Elopement Risk: No Does patient have medical clearance?: Yes     Disposition:  Disposition Initial Assessment Completed for this Encounter: Yes Disposition of Patient: Other dispositions (Pending psychiatric consult this am)   Patient evaluated by Nanine Means, NP and Dr. Jannifer Franklin. Discharge was recommended with follow up at [facility]. Patient has a 2pm appointment at [facility] today.   On Site Evaluation by:   Reviewed with Physician:    Melynda Ripple [facility] 09/14/2014 9:02 AM

## 2014-09-14 NOTE — ED Notes (Signed)
[facility].  When asked if he is suicidal or homicidal, patient states, "Not really, I just don't want to go back to the streets."  Patient indicates he has a place to stay - with his cousin [place] the country."  Patient states he'd like a bus pass and breakfast.

## 2014-09-26 ENCOUNTER — Encounter (HOSPITAL_COMMUNITY): Payer: Self-pay | Admitting: Emergency Medicine

## 2014-09-26 ENCOUNTER — Emergency Department (HOSPITAL_COMMUNITY)
Admission: EM | Admit: 2014-09-26 | Discharge: 2014-09-27 | Disposition: A | Discharge status: Home / Self Care | Payer: Medicare Other | Attending: Emergency Medicine | Admitting: Emergency Medicine

## 2014-09-26 DIAGNOSIS — Z21 Asymptomatic human immunodeficiency virus [HIV] infection status: Secondary | ICD-10-CM | POA: Diagnosis not present

## 2014-09-26 DIAGNOSIS — Z720 Tobacco use: Secondary | ICD-10-CM | POA: Insufficient documentation

## 2014-09-26 DIAGNOSIS — Z7982 Long term (current) use of aspirin: Secondary | ICD-10-CM | POA: Insufficient documentation

## 2014-09-26 DIAGNOSIS — G8929 Other chronic pain: Secondary | ICD-10-CM | POA: Diagnosis not present

## 2014-09-26 DIAGNOSIS — M79672 Pain in left foot: Secondary | ICD-10-CM | POA: Insufficient documentation

## 2014-09-26 DIAGNOSIS — F209 Schizophrenia, unspecified: Secondary | ICD-10-CM | POA: Insufficient documentation

## 2014-09-26 DIAGNOSIS — E119 Type 2 diabetes mellitus without complications: Secondary | ICD-10-CM | POA: Insufficient documentation

## 2014-09-26 DIAGNOSIS — Z590 Homelessness: Secondary | ICD-10-CM | POA: Diagnosis not present

## 2014-09-26 DIAGNOSIS — I10 Essential (primary) hypertension: Secondary | ICD-10-CM | POA: Insufficient documentation

## 2014-09-26 DIAGNOSIS — M79671 Pain in right foot: Secondary | ICD-10-CM

## 2014-09-26 DIAGNOSIS — Z8619 Personal history of other infectious and parasitic diseases: Secondary | ICD-10-CM | POA: Diagnosis not present

## 2014-09-26 DIAGNOSIS — Z79899 Other long term (current) drug therapy: Secondary | ICD-10-CM | POA: Insufficient documentation

## 2014-09-26 DIAGNOSIS — I509 Heart failure, unspecified: Secondary | ICD-10-CM | POA: Diagnosis not present

## 2014-09-26 NOTE — ED Notes (Signed)
Pt to ED via [facility] for evaluation of bilateral foot pain after standing all day.  Pt states "I just need to wash my feet and need new socks".  Denies injury.

## 2014-09-27 MED ORDER — ACETAMINOPHEN 325 MG PO TABS
650.0000 mg | ORAL_TABLET | Freq: Once | ORAL | Status: AC
  Administered 2014-09-27: 650 mg via ORAL
  Filled 2014-09-27: qty 2

## 2014-09-27 MED ORDER — ACETAMINOPHEN 325 MG PO TABS: 650.0000 mg | ORAL_TABLET | Freq: Once | ORAL | Status: DC

## 2014-09-27 NOTE — ED Provider Notes (Signed)
CSN: [id number]     Arrival date & time 09/26/14  2327 History   First MD Initiated Contact with Patient 09/27/14 0015     Chief Complaint  Patient presents with  . Foot Pain     (Consider location/radiation/quality/duration/timing/severity/associated sxs/prior Treatment) HPI Comments: Patient is homeless, pan-handles and is on feet all today.  Presents to ED for foot pain onset after walking.  No injury.  Denies any other complaints at present.  Requesting a [place] sandwich and tylenol.  Patient is a 54 y.o. male presenting with lower extremity pain. The history is provided by the patient. No language interpreter was used.  Foot Pain This is a recurrent problem. The current episode started today. The problem occurs constantly. The problem has been waxing and waning. The symptoms are aggravated by walking. He has tried nothing for the symptoms.    Past Medical History  Diagnosis Date  . Diabetes mellitus without complication   . Hypertension   . Schizophrenia   . CHF (congestive heart failure)   . Neuropathy   . Polysubstance abuse   . Cocaine abuse   . Homelessness   . HIV (human immunodeficiency virus infection)   . Hepatitis C   . Chronic foot pain    History reviewed. No pertinent past surgical history. Family History  Problem Relation Age of Onset  . Hypertension Other   . Diabetes Other    History  Substance Use Topics  . Smoking status: Current Every Day Smoker -- 1.00 packs/day for 20 years    Types: Cigarettes  . Smokeless tobacco: Current User  . Alcohol Use: Yes     Comment: Pt denies    Review of Systems  Skin: Negative for wound.  All other systems reviewed and are negative.     Allergies  Haldol  Home Medications   Prior to Admission medications   Medication Sig Start Date End Date Taking? Authorizing Provider  ARIPiprazole (ABILIFY) 10 MG tablet Take 1 tablet (10 mg total) by mouth daily. For mood control 08/12/14   Sanjuana KavaAgnes I Nwoko, NP   ARIPiprazole 400 MG SUSR Inject 400 mg into the muscle every 30 (thirty) days. (Due to be given on 09-12-14 @ 08:00 am):For mood control 09/12/14   Sanjuana KavaAgnes I Nwoko, NP  aspirin 81 MG EC tablet Take 1 tablet (81 mg total) by mouth daily. For heart health 08/12/14   Sanjuana KavaAgnes I Nwoko, NP  benazepril (LOTENSIN) 5 MG tablet Take 1 tablet (5 mg total) by mouth daily. For high blood pressure 08/12/14   Sanjuana KavaAgnes I Nwoko, NP  benztropine (COGENTIN) 2 MG tablet Take 1 tablet (2 mg total) by mouth 2 (two) times daily. For prevention of drug induced tremors 08/12/14   Sanjuana KavaAgnes I Nwoko, NP  cloNIDine (CATAPRES) 0.1 MG tablet Take 1 tablet (0.1 mg total) by mouth 2 (two) times daily. For high blood pressure 08/12/14   Sanjuana KavaAgnes I Nwoko, NP  furosemide (LASIX) 20 MG tablet Take 1 tablet (20 mg total) by mouth daily. For swellings/high blood pressure 08/12/14   Sanjuana KavaAgnes I Nwoko, NP  metFORMIN (GLUCOPHAGE) 500 MG tablet Take 1 tablet (500 mg total) by mouth daily with breakfast. For high blood sugar control 08/12/14   Sanjuana KavaAgnes I Nwoko, NP  tamsulosin (FLOMAX) 0.4 MG CAPS capsule Take 1 capsule (0.4 mg total) by mouth daily after supper. For prostate health 08/12/14   Sanjuana KavaAgnes I Nwoko, NP  traZODone (DESYREL) 50 MG tablet Take 1 tablet (50 mg total) by mouth at bedtime  as needed for sleep. 08/12/14   Sanjuana Kava, NP   BP 165/103 mmHg  Pulse 96  Temp(Src) 98.5 F (36.9 C) (Oral)  Resp 20  Ht  (1.753 m)  Wt 169 lb 3.2 oz (76.749 kg)  BMI 24.98 kg/m2  SpO2 98% Physical Exam  Constitutional: He is oriented to person, place, and time. He appears well-developed and well-nourished. No distress.  HENT:  Mouth/Throat: Oropharynx is clear and moist.  Eyes: Pupils are equal, round, and reactive to light.  Neck: Neck supple.  Cardiovascular: Normal rate and regular rhythm.   Pulmonary/Chest: Effort normal and breath sounds normal.  Abdominal: Soft. Bowel sounds are normal.  Musculoskeletal: He exhibits tenderness. He exhibits no edema.   Calluses on ball of foot bilaterally.  No redness, warmth, or sign of infection.  Lymphadenopathy:    He has no cervical adenopathy.  Neurological: He is alert and oriented to person, place, and time.  Skin: Skin is warm and dry.  Psychiatric: He has a normal mood and affect.  Nursing note and vitals reviewed.   ED Course  Procedures (including critical care time) Labs Review Labs Reviewed - No data to display  Imaging Review No results found.   EKG Interpretation None     Feet washed and dried.  New footies provided. MDM   Final diagnoses:  None   Foot pain.    Felicie Morn, NP 09/27/14 0454  Geoffery Lyons, MD 09/27/14 [id number]

## 2014-09-27 NOTE — Discharge Instructions (Signed)

## 2014-10-01 ENCOUNTER — Emergency Department (HOSPITAL_COMMUNITY)
Admission: EM | Admit: 2014-10-01 | Discharge: 2014-10-01 | Disposition: A | Discharge status: Home / Self Care | Payer: Medicare Other | Attending: Emergency Medicine | Admitting: Emergency Medicine

## 2014-10-01 ENCOUNTER — Encounter (HOSPITAL_COMMUNITY): Payer: Self-pay | Admitting: Emergency Medicine

## 2014-10-01 DIAGNOSIS — Z590 Homelessness: Secondary | ICD-10-CM | POA: Insufficient documentation

## 2014-10-01 DIAGNOSIS — Z8669 Personal history of other diseases of the nervous system and sense organs: Secondary | ICD-10-CM | POA: Insufficient documentation

## 2014-10-01 DIAGNOSIS — Z79899 Other long term (current) drug therapy: Secondary | ICD-10-CM | POA: Insufficient documentation

## 2014-10-01 DIAGNOSIS — E119 Type 2 diabetes mellitus without complications: Secondary | ICD-10-CM | POA: Insufficient documentation

## 2014-10-01 DIAGNOSIS — Z7982 Long term (current) use of aspirin: Secondary | ICD-10-CM | POA: Insufficient documentation

## 2014-10-01 DIAGNOSIS — I10 Essential (primary) hypertension: Secondary | ICD-10-CM | POA: Insufficient documentation

## 2014-10-01 DIAGNOSIS — F1420 Cocaine dependence, uncomplicated: Secondary | ICD-10-CM

## 2014-10-01 DIAGNOSIS — R4589 Other symptoms and signs involving emotional state: Secondary | ICD-10-CM

## 2014-10-01 DIAGNOSIS — M79673 Pain in unspecified foot: Secondary | ICD-10-CM | POA: Insufficient documentation

## 2014-10-01 DIAGNOSIS — Z720 Tobacco use: Secondary | ICD-10-CM | POA: Insufficient documentation

## 2014-10-01 DIAGNOSIS — B20 Human immunodeficiency virus [HIV] disease: Secondary | ICD-10-CM | POA: Insufficient documentation

## 2014-10-01 DIAGNOSIS — I509 Heart failure, unspecified: Secondary | ICD-10-CM | POA: Insufficient documentation

## 2014-10-01 DIAGNOSIS — R4689 Other symptoms and signs involving appearance and behavior: Secondary | ICD-10-CM

## 2014-10-01 DIAGNOSIS — G8929 Other chronic pain: Secondary | ICD-10-CM | POA: Insufficient documentation

## 2014-10-01 DIAGNOSIS — R45851 Suicidal ideations: Secondary | ICD-10-CM | POA: Insufficient documentation

## 2014-10-01 LAB — CBC
HCT: 39.3 % (ref 39.0–52.0)
HEMOGLOBIN: 12.4 g/dL — AB (ref 13.0–17.0)
MCH: 28.4 pg (ref 26.0–34.0)
MCHC: 31.6 g/dL (ref 30.0–36.0)
MCV: 89.9 fL (ref 78.0–100.0)
Platelets: 159 10*3/uL (ref 150–400)
RBC: 4.37 MIL/uL (ref 4.22–5.81)
RDW: 13.8 % (ref 11.5–15.5)
WBC: 4.4 10*3/uL (ref 4.0–10.5)

## 2014-10-01 LAB — COMPREHENSIVE METABOLIC PANEL
ALK PHOS: 86 U/L (ref 39–117)
ALT: 13 U/L (ref 0–53)
ANION GAP: 13 (ref 5–15)
AST: 28 U/L (ref 0–37)
Albumin: 3.9 g/dL (ref 3.5–5.2)
BUN: 15 mg/dL (ref 6–23)
CHLORIDE: 104 mmol/L (ref 96–112)
CO2: 25 mmol/L (ref 19–32)
CREATININE: 0.97 mg/dL (ref 0.50–1.35)
Calcium: 8.9 mg/dL (ref 8.4–10.5)
GFR calc non Af Amer: >90 mL/min (ref >90)
Glucose, Bld: 192 mg/dL — ABNORMAL HIGH (ref 70–99)
Potassium: 3.5 mmol/L (ref 3.5–5.1)
Sodium: 142 mmol/L (ref 135–145)
Total Bilirubin: 0.8 mg/dL (ref 0.3–1.2)
Total Protein: 6.8 g/dL (ref 6.0–8.3)

## 2014-10-01 LAB — SALICYLATE LEVEL: Salicylate Lvl: <4 mg/dL (ref 2.8–20.0)

## 2014-10-01 LAB — RAPID URINE DRUG SCREEN, HOSP PERFORMED
AMPHETAMINES: NOT DETECTED
Barbiturates: NOT DETECTED
Benzodiazepines: NOT DETECTED
Cocaine: POSITIVE — AB
Opiates: NOT DETECTED
Tetrahydrocannabinol: NOT DETECTED

## 2014-10-01 LAB — ACETAMINOPHEN LEVEL

## 2014-10-01 LAB — ETHANOL: Alcohol, Ethyl (B): <5 mg/dL (ref 0–9)

## 2014-10-01 MED ORDER — METFORMIN HCL 500 MG PO TABS
500.0000 mg | ORAL_TABLET | Freq: Every day | ORAL | Status: DC
  Administered 2014-10-01: 500 mg via ORAL
  Filled 2014-10-01 (×3): qty 1

## 2014-10-01 MED ORDER — ONDANSETRON HCL 4 MG PO TABS: 4.0000 mg | ORAL_TABLET | Freq: Three times a day (TID) | ORAL | Status: DC | PRN

## 2014-10-01 MED ORDER — ASPIRIN 81 MG PO CHEW
81.0000 mg | CHEWABLE_TABLET | Freq: Every day | ORAL | Status: DC
  Administered 2014-10-01: 81 mg via ORAL
  Filled 2014-10-01: qty 1

## 2014-10-01 MED ORDER — FUROSEMIDE 20 MG PO TABS
20.0000 mg | ORAL_TABLET | Freq: Every day | ORAL | Status: DC
  Administered 2014-10-01: 20 mg via ORAL
  Filled 2014-10-01: qty 1

## 2014-10-01 MED ORDER — TRAZODONE HCL 50 MG PO TABS: 50.0000 mg | ORAL_TABLET | Freq: Every evening | ORAL | Status: DC | PRN

## 2014-10-01 MED ORDER — ALUM & MAG HYDROXIDE-SIMETH 200-200-20 MG/5ML PO SUSP: 30.0000 mL | ORAL | Status: DC | PRN

## 2014-10-01 MED ORDER — NICOTINE 21 MG/24HR TD PT24
21.0000 mg | MEDICATED_PATCH | Freq: Every day | TRANSDERMAL | Status: DC
  Administered 2014-10-01: 21 mg via TRANSDERMAL
  Filled 2014-10-01: qty 1

## 2014-10-01 MED ORDER — CLONIDINE HCL 0.1 MG PO TABS
0.1000 mg | ORAL_TABLET | Freq: Two times a day (BID) | ORAL | Status: DC
  Administered 2014-10-01: 0.1 mg via ORAL
  Filled 2014-10-01: qty 1

## 2014-10-01 MED ORDER — ARIPIPRAZOLE 10 MG PO TABS
10.0000 mg | ORAL_TABLET | Freq: Every day | ORAL | Status: DC
  Administered 2014-10-01: 10 mg via ORAL
  Filled 2014-10-01: qty 1

## 2014-10-01 MED ORDER — BENZTROPINE MESYLATE 1 MG PO TABS
2.0000 mg | ORAL_TABLET | Freq: Two times a day (BID) | ORAL | Status: DC
  Administered 2014-10-01: 2 mg via ORAL
  Filled 2014-10-01: qty 2

## 2014-10-01 MED ORDER — ACETAMINOPHEN 325 MG PO TABS
650.0000 mg | ORAL_TABLET | ORAL | Status: DC | PRN
  Administered 2014-10-01: 650 mg via ORAL
  Filled 2014-10-01: qty 2

## 2014-10-01 MED ORDER — BENAZEPRIL HCL 5 MG PO TABS
5.0000 mg | ORAL_TABLET | Freq: Every day | ORAL | Status: DC
  Administered 2014-10-01: 5 mg via ORAL
  Filled 2014-10-01: qty 1

## 2014-10-01 MED ORDER — TAMSULOSIN HCL 0.4 MG PO CAPS
0.4000 mg | ORAL_CAPSULE | Freq: Every day | ORAL | Status: DC
  Filled 2014-10-01: qty 1
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
## 2014-10-01 NOTE — ED Notes (Signed)
tts into see 

## 2014-10-01 NOTE — ED Notes (Signed)
CSW into see 

## 2014-10-01 NOTE — Progress Notes (Signed)
1:47pm. CSW consulted by Psych NP to provide homelessness resources. CSW met with pt and discussed homelessness resources and provided handout. Pt asked questions about [employer]. CSW reviewed requirements for [facility], and provided number for him to call when he is d/c should he want to pursue that living option. Pt thanked CSW.   Rochele Pages,     ED CSW  phone: [phone]

## 2014-10-01 NOTE — [facility] Suicide Risk Assessment (Cosign Needed)
Suicide Risk Assessment  Discharge Assessment   [facility]   Demographic Factors:  Male, Low socioeconomic status, Living alone and Unemployed  Total Time spent with patient: 20 minutes  Musculoskeletal: Strength & Muscle Tone: within normal limits Gait & Station: normal Patient leans: N/A  Psychiatric Specialty Exam:     Blood pressure 156/81, pulse 88, temperature 98.1 F (36.7 C), temperature source Oral, resp. rate 18, SpO2 98 %.There is no weight on file to calculate BMI.  General Appearance: Casual and Disheveled  Eye Contact::  Good  Speech:  Clear and Coherent and Normal Rate409  Volume:  Normal  Mood:  Euthymic  Affect:  Congruent  Thought Process:  Coherent, Goal Directed and Intact  Orientation:  Full (Time, Place, and Person)  Thought Content:  WDL  Suicidal Thoughts:  No  Homicidal Thoughts:  No  Memory:  Immediate;   Good Recent;   Good Remote;   Good  Judgement:  Fair  Insight:  Shallow  Psychomotor Activity:  Normal  Concentration:  Good  Recall:  NA  Fund of Knowledge:Fair  Language: Good  Akathisia:  NA  Handed:  Right  AIMS (if indicated):     Assets:  Desire for Improvement  Sleep:     Cognition: WNL  ADL's:  Impaired      Has this patient used any form of tobacco in the last 30 days? (Cigarettes, Smokeless Tobacco, Cigars, and/or Pipes) Yes, A prescription for an FDA-approved tobacco cessation medication was offered at discharge and the patient refused  Mental Status Per Nursing Assessment::   On Admission:     Current Mental Status by Physician: NA  Loss Factors: NA  Historical Factors: NA  Risk Reduction Factors:   Positive social support  Continued Clinical Symptoms:  Schizophrenia:   Paranoid or undifferentiated type  Cognitive Features That Contribute To Risk:  Polarized thinking    Suicide Risk:  Minimal: No identifiable suicidal ideation.  Patients presenting with no risk factors but with  morbid ruminations; may be classified as minimal risk based on the severity of the depressive symptoms  Principal Problem: Cocaine use disorder, severe, dependence Discharge Diagnoses:  Patient Active Problem List   Diagnosis Date Noted  . 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 Cocaine use disorder, severe, dependence [F14.20]     Priority: High  . Chronic paranoid schizophrenia [F20.0] 09/07/2014  . Suicidal ideations [R45.851] 08/28/2014  . Substance induced mood disorder [F19.94] 08/26/2014  . Schizophrenia [F20.9] 08/11/2014  . Acute CHF (congestive heart failure) [I50.9] 07/29/2014  . Essential hypertension, benign [I10] 03/28/2013  . Diabetes mellitus [E11.9] 03/15/2013      Plan Of Care/Follow-up recommendations:  Activity:  as tolerated  Diet:  regular  Is patient on multiple antipsychotic therapies at discharge:  No   Has Patient had three or more failed trials of antipsychotic monotherapy by history:  No  Recommended Plan for Multiple Antipsychotic Therapies: NA    Dahlia ByesONUOHA, Jonaven Hilgers C    PMHNP-BC 10/01/2014, 12:33 PM

## 2014-10-01 NOTE — ED Notes (Signed)
Pt reports he is SI and having bilateral foot pain. Pt reports he wants to jump out in front of a car.

## 2014-10-01 NOTE — Consult Note (Signed)
[facility] Consult   Reason for Consult:  Cocaine use disorder, severe, Cocaine intoxication, suicidal ideation. Referring Physician: EDP Patient Identification: [name] MRN:  [id number] Principal Diagnosis: Cocaine use disorder, severe, dependence Diagnosis:   Patient Active Problem List   Diagnosis Date Noted  . 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 Cocaine use disorder, severe, dependence [F14.20]     Priority: High  . Chronic paranoid schizophrenia [F20.0] 09/07/2014  . Suicidal ideations [R45.851] 08/28/2014  . Substance induced mood disorder [F19.94] 08/26/2014  . Schizophrenia [F20.9] 08/11/2014  . Acute CHF (congestive heart failure) [I50.9] 07/29/2014  . Essential hypertension, benign [I10] 03/28/2013  . Diabetes mellitus [E11.9] 03/15/2013    Total Time spent with patient: 1 hour  Subjective:   [name] is a 54 y.o. male patient admitted with Cocaine use and intoxication, suicidal ideation.[name]  HPI: [name], 54 years old was evaluated this morning for suicidal ideation with plans to jump into traffic.  Patient was also under the influence of Cocaine.  This is his 59 th ER visit since January this year.  Patient stated that he is tired of using Cocaine and need to move into a group home.  Patient also admitted that he lied about suicidal intent because he is not safe to be in the street because he owes his Cocaine dealer money.  Patient stated that he is afraid to be out in the street until he pays his dealer.  He also stated that he want to stop using Cocaine and move into a group home.  This morning he denied SI/HI/AVH.  He now have decided to leave and plans to seek treatment for  using Cocaine.  We will discharge patient and encourage him to follow up with [facility] for his Paranoid Schizophrenia and Cocaine use.  HPI Elements:   Location:   Cocaine use disorder, severe, Schizophrenia, Paranoid type by hx.. Quality:  moderate-mild. Severity:  Moderate-mild. Timing:  Acute. Duration:  Chronic mental illness, Chronic Cocaine use disorder, severe. Context:  seeking treatment for Cocaine use disorder.  Past Medical History:  Past Medical History  Diagnosis Date  . Diabetes mellitus without complication   . Hypertension   . Schizophrenia   . CHF (congestive heart failure)   . Neuropathy   . Polysubstance abuse   . Cocaine abuse   . Homelessness   . HIV (human immunodeficiency virus infection)   . Hepatitis C   . Chronic foot pain    History reviewed. No pertinent past surgical history. Family History:  Family History  Problem Relation Age of Onset  . Hypertension Other   . Diabetes Other    Social History:  History  Alcohol Use  . Yes    Comment: Pt denies     History  Drug Use  . 7.00 per week  . Special: "Crack" cocaine, Cocaine, Marijuana    Comment: Cocaine tonight, Marijuana "a long time"    History   Social History  . Marital Status: Divorced    Spouse Name: N/A  . Number of Children: N/A  . Years of Education: N/A   Social History Main Topics  . Smoking status: Current Every Day Smoker -- 1.00 packs/day for 20 years    Types: Cigarettes  . Smokeless tobacco: Current User  . Alcohol Use: Yes     Comment: Pt denies  . Drug Use: 7.00 per week  Special: "Crack" cocaine, Cocaine, Marijuana     Comment: Cocaine tonight, Marijuana "a long time"  . Sexual Activity: No   Other Topics Concern  . None   Social History Narrative   ** Merged History Encounter **       Additional Social History:                          Allergies:   Allergies  Allergen Reactions  . Haldol [Haloperidol] Other (See Comments)    Muscle spasms, loss of voluntary movement. However, pt has taken Thorazine on multiple occasions with no adverse effects.     Labs:  Results for orders placed or performed  during the hospital encounter of 10/01/14 (from the past 48 hour(s))  Urine Drug Screen     Status: Abnormal   Collection Time: 10/01/14  6:01 AM  Result Value Ref Range   Opiates NONE DETECTED NONE DETECTED   Cocaine POSITIVE (A) NONE DETECTED   Benzodiazepines NONE DETECTED NONE DETECTED   Amphetamines NONE DETECTED NONE DETECTED   Tetrahydrocannabinol NONE DETECTED NONE DETECTED   Barbiturates NONE DETECTED NONE DETECTED    Comment:        DRUG SCREEN FOR MEDICAL PURPOSES ONLY.  IF CONFIRMATION IS NEEDED FOR ANY PURPOSE, NOTIFY LAB WITHIN 5 DAYS.        LOWEST DETECTABLE LIMITS FOR URINE DRUG SCREEN Drug Class       Cutoff (ng/mL) Amphetamine      1000 Barbiturate      200 Benzodiazepine   962 Tricyclics       836 Opiates          300 Cocaine          300 THC              50   Acetaminophen level     Status: Abnormal   Collection Time: 10/01/14  6:10 AM  Result Value Ref Range   Acetaminophen (Tylenol), Serum <10.0 (L) 10 - 30 ug/mL    Comment:        THERAPEUTIC CONCENTRATIONS VARY SIGNIFICANTLY. A RANGE OF 10-30 ug/mL MAY BE AN EFFECTIVE CONCENTRATION FOR MANY PATIENTS. HOWEVER, SOME ARE BEST TREATED AT CONCENTRATIONS OUTSIDE THIS RANGE. ACETAMINOPHEN CONCENTRATIONS >150 ug/mL AT 4 HOURS AFTER INGESTION AND >50 ug/mL AT 12 HOURS AFTER INGESTION ARE OFTEN ASSOCIATED WITH TOXIC REACTIONS.   CBC     Status: Abnormal   Collection Time: 10/01/14  6:10 AM  Result Value Ref Range   WBC 4.4 4.0 - 10.5 K/uL   RBC 4.37 4.22 - 5.81 MIL/uL   Hemoglobin 12.4 (L) 13.0 - 17.0 g/dL   HCT 39.3 39.0 - 52.0 %   MCV 89.9 78.0 - 100.0 fL   MCH 28.4 26.0 - 34.0 pg   MCHC 31.6 30.0 - 36.0 g/dL   RDW 13.8 11.5 - 15.5 %   Platelets 159 150 - 400 K/uL  Comprehensive metabolic panel     Status: Abnormal   Collection Time: 10/01/14  6:10 AM  Result Value Ref Range   Sodium 142 135 - 145 mmol/L   Potassium 3.5 3.5 - 5.1 mmol/L   Chloride 104 96 - 112 mmol/L   CO2 25 19 - 32  mmol/L   Glucose, Bld 192 (H) 70 - 99 mg/dL   BUN 15 6 - 23 mg/dL   Creatinine, Ser 0.97 0.50 - 1.35 mg/dL   Calcium 8.9 8.4 - 10.5 mg/dL   Total  Protein 6.8 6.0 - 8.3 g/dL   Albumin 3.9 3.5 - 5.2 g/dL   AST 28 0 - 37 U/L   ALT 13 0 - 53 U/L   Alkaline Phosphatase 86 39 - 117 U/L   Total Bilirubin 0.8 0.3 - 1.2 mg/dL   GFR calc non Af Amer >90 >90 mL/min   GFR calc Af Amer >90 >90 mL/min    Comment: (NOTE) The eGFR has been calculated using the CKD EPI equation. This calculation has not been validated in all clinical situations. eGFR's persistently <90 mL/min signify possible Chronic Kidney Disease.    Anion gap 13 5 - 15  Ethanol (ETOH)     Status: None   Collection Time: 10/01/14  6:10 AM  Result Value Ref Range   Alcohol, Ethyl (B) <5 0 - 9 mg/dL    Comment:        LOWEST DETECTABLE LIMIT FOR SERUM ALCOHOL IS 11 mg/dL FOR MEDICAL PURPOSES ONLY   Salicylate level     Status: None   Collection Time: 10/01/14  6:10 AM  Result Value Ref Range   Salicylate Lvl <3.7 2.8 - 20.0 mg/dL    Vitals: Blood pressure 156/81, pulse 88, temperature 98.1 F (36.7 C), temperature source Oral, resp. rate 18, SpO2 98 %.  Risk to Self: Suicidal Ideation: Yes-Currently Present Suicidal Intent: No Is patient at risk for suicide?: No Suicidal Plan?: Yes-Currently Present Specify Current Suicidal Plan: jump in traffic Access to Means: Yes Specify Access to Suicidal Means: traffic What has been your use of drugs/alcohol within the last 12 months?: cocaine Triggers for Past Attempts: Unpredictable Intentional Self Injurious Behavior: None Risk to Others: Homicidal Ideation: No-Not Currently/Within Last 6 Months Thoughts of Harm to Others: No-Not Currently Present/Within Last 6 Months Current Homicidal Intent: No-Not Currently/Within Last 6 Months Current Homicidal Plan: No-Not Currently/Within Last 6 Months Access to Homicidal Means: No History of harm to others?: No Assessment of  Violence: None Noted Does patient have access to weapons?: No Criminal Charges Pending?: No Does patient have a court date: No Prior Inpatient Therapy: Prior Inpatient Therapy: Yes Prior Therapy Dates: Multiple  Prior Therapy Facilty/Provider(s): Multiple  Reason for Treatment: Psychosis Prior Outpatient Therapy: Prior Outpatient Therapy: Yes Prior Therapy Dates: current Prior Therapy Facilty/Provider(s): [name] Reason for Treatment: med management   Current Facility-Administered Medications  Medication Dose Route Frequency Provider Last Rate Last Dose  . acetaminophen (TYLENOL) tablet 650 mg  650 mg Oral Q4H PRN Blanchie Dessert, MD   650 mg at 10/01/14 0824  . alum & mag hydroxide-simeth (MAALOX/MYLANTA) 200-200-20 MG/5ML suspension 30 mL  30 mL Oral PRN Blanchie Dessert, MD      . ARIPiprazole (ABILIFY) tablet 10 mg  10 mg Oral Daily Blanchie Dessert, MD   10 mg at 10/01/14 1022  . aspirin chewable tablet 81 mg  81 mg Oral Daily Blanchie Dessert, MD   81 mg at 10/01/14 1022  . benazepril (LOTENSIN) tablet 5 mg  5 mg Oral Daily Blanchie Dessert, MD   5 mg at 10/01/14 1022  . benztropine (COGENTIN) tablet 2 mg  2 mg Oral BID Blanchie Dessert, MD   2 mg at 10/01/14 1022  . cloNIDine (CATAPRES) tablet 0.1 mg  0.1 mg Oral BID Blanchie Dessert, MD   0.1 mg at 10/01/14 1021  . furosemide (LASIX) tablet 20 mg  20 mg Oral Daily Blanchie Dessert, MD   20 mg at 10/01/14 1021  . metFORMIN (GLUCOPHAGE) tablet 500 mg  500 mg  Oral Q breakfast Blanchie Dessert, MD   500 mg at 10/01/14 0836  . nicotine (NICODERM CQ - dosed in mg/24 hours) patch 21 mg  21 mg Transdermal Daily Blanchie Dessert, MD   21 mg at 10/01/14 1022  . ondansetron (ZOFRAN) tablet 4 mg  4 mg Oral Q8H PRN Blanchie Dessert, MD      . tamsulosin (FLOMAX) capsule 0.4 mg  0.4 mg Oral QPC supper Blanchie Dessert, MD      . traZODone (DESYREL) tablet 50 mg  50 mg Oral QHS PRN Blanchie Dessert, MD       Current Outpatient Prescriptions   Medication Sig Dispense Refill  . aspirin 81 MG EC tablet Take 1 tablet (81 mg total) by mouth daily. For heart health 30 tablet 3  . benazepril (LOTENSIN) 5 MG tablet Take 1 tablet (5 mg total) by mouth daily. For high blood pressure 30 tablet 0  . benztropine (COGENTIN) 2 MG tablet Take 1 tablet (2 mg total) by mouth 2 (two) times daily. For prevention of drug induced tremors 60 tablet 0  . cloNIDine (CATAPRES) 0.1 MG tablet Take 1 tablet (0.1 mg total) by mouth 2 (two) times daily. For high blood pressure 60 tablet 0  . furosemide (LASIX) 20 MG tablet Take 1 tablet (20 mg total) by mouth daily. For swellings/high blood pressure 30 tablet 3  . metFORMIN (GLUCOPHAGE) 500 MG tablet Take 1 tablet (500 mg total) by mouth daily with breakfast. For high blood sugar control 30 tablet 0  . tamsulosin (FLOMAX) 0.4 MG CAPS capsule Take 1 capsule (0.4 mg total) by mouth daily after supper. For prostate health 30 capsule 0  . traZODone (DESYREL) 50 MG tablet Take 1 tablet (50 mg total) by mouth at bedtime as needed for sleep. 30 tablet 0  . ARIPiprazole (ABILIFY) 10 MG tablet Take 1 tablet (10 mg total) by mouth daily. For mood control 30 tablet 0  . ARIPiprazole 400 MG SUSR Inject 400 mg into the muscle every 30 (thirty) days. (Due to be given on 09-12-14 @ 08:00 am):For mood control (Patient taking differently: Inject 400 mg into the muscle every 30 (thirty) days. ) 1 each 0    Musculoskeletal: Strength & Muscle Tone: within normal limits Gait & Station: normal Patient leans: N/A  Psychiatric Specialty Exam:     Blood pressure 156/81, pulse 88, temperature 98.1 F (36.7 C), temperature source Oral, resp. rate 18, SpO2 98 %.There is no weight on file to calculate BMI.  General Appearance: Casual and Disheveled  Eye Contact::  Good  Speech:  Clear and Coherent and Normal Rate  Volume:  Normal  Mood:  Euthymic  Affect:  Congruent  Thought Process:  Coherent, Goal Directed and Intact   Orientation:  Full (Time, Place, and Person)  Thought Content:  WDL  Suicidal Thoughts:  No  Homicidal Thoughts:  No  Memory:  Immediate;   Good Recent;   Good Remote;   Good  Judgement:  Poor  Insight:  Shallow  Psychomotor Activity:  Normal  Concentration:  Good  Recall:  NA  Fund of Knowledge:Fair  Language: Good  Akathisia:  NA  Handed:  Right  AIMS (if indicated):     Assets:  Desire for Improvement  ADL's:  Impaired  Cognition: WNL  Sleep:      Medical Decision Making: Established Problem, Stable/Improving (1)  Treatment Plan Summary: Plan Discharge home  Plan:  Discharge home. Disposition: see above  ONUOHA, JOSEPHINE C  PMHNP-BC 10/01/2014 12:01 PM Patient seen face-to-face for psychiatric evaluation, chart reviewed and case discussed with the physician extender and developed treatment plan. Reviewed the information documented and agree with the treatment plan. Corena Pilgrim, MD

## 2014-10-01 NOTE — ED Notes (Signed)
Dr A and Josephine NP into see 

## 2014-10-01 NOTE — ED Provider Notes (Signed)
CSN: [id number]     Arrival date & time 10/01/14  0455 History   First MD Initiated Contact with Patient 10/01/14 [id number]     Chief Complaint  Patient presents with  . Foot Pain  . Suicidal     (Consider location/radiation/quality/duration/timing/severity/associated sxs/prior Treatment) HPI Comments: Patient presents to the emergency room tonight stating he is suicidal.  His plan was to jump in front of a car.  He has chronic foot pain from neuropathy is complaining that his feet are hurting him after walking.  He has no other medical complaints at this time  Patient is a 54 y.o. male presenting with lower extremity pain. The history is provided by the patient.  Foot Pain This is a chronic problem. The problem occurs intermittently. The problem has been unchanged. Pertinent negatives include no fever, joint swelling, numbness or rash. The symptoms are aggravated by walking. He has tried nothing for the symptoms. The treatment provided no relief.    Past Medical History  Diagnosis Date  . Diabetes mellitus without complication   . Hypertension   . Schizophrenia   . CHF (congestive heart failure)   . Neuropathy   . Polysubstance abuse   . Cocaine abuse   . Homelessness   . HIV (human immunodeficiency virus infection)   . Hepatitis C   . Chronic foot pain    History reviewed. No pertinent past surgical history. Family History  Problem Relation Age of Onset  . Hypertension Other   . Diabetes Other    History  Substance Use Topics  . Smoking status: Current Every Day Smoker -- 1.00 packs/day for 20 years    Types: Cigarettes  . Smokeless tobacco: Current User  . Alcohol Use: Yes     Comment: Pt denies    Review of Systems  Constitutional: Negative for fever.  Musculoskeletal: Negative for joint swelling.  Skin: Negative for rash.  Neurological: Negative for numbness.  Psychiatric/Behavioral: Positive for suicidal ideas.  All other systems reviewed and are  negative.     Allergies  Haldol  Home Medications   Prior to Admission medications   Medication Sig Start Date End Date Taking? Authorizing Provider  ARIPiprazole (ABILIFY) 10 MG tablet Take 1 tablet (10 mg total) by mouth daily. For mood control 08/12/14   Sanjuana Kava, NP  ARIPiprazole 400 MG SUSR Inject 400 mg into the muscle every 30 (thirty) days. (Due to be given on 09-12-14 @ 08:00 am):For mood control 09/12/14   Sanjuana Kava, NP  aspirin 81 MG EC tablet Take 1 tablet (81 mg total) by mouth daily. For heart health 08/12/14   Sanjuana Kava, NP  benazepril (LOTENSIN) 5 MG tablet Take 1 tablet (5 mg total) by mouth daily. For high blood pressure 08/12/14   Sanjuana Kava, NP  benztropine (COGENTIN) 2 MG tablet Take 1 tablet (2 mg total) by mouth 2 (two) times daily. For prevention of drug induced tremors 08/12/14   Sanjuana Kava, NP  cloNIDine (CATAPRES) 0.1 MG tablet Take 1 tablet (0.1 mg total) by mouth 2 (two) times daily. For high blood pressure 08/12/14   Sanjuana Kava, NP  furosemide (LASIX) 20 MG tablet Take 1 tablet (20 mg total) by mouth daily. For swellings/high blood pressure 08/12/14   Sanjuana Kava, NP  metFORMIN (GLUCOPHAGE) 500 MG tablet Take 1 tablet (500 mg total) by mouth daily with breakfast. For high blood sugar control 08/12/14   Sanjuana Kava, NP  tamsulosin [facility])  0.4 MG CAPS capsule Take 1 capsule (0.4 mg total) by mouth daily after supper. For prostate health 08/12/14   Sanjuana KavaAgnes I Nwoko, NP  traZODone (DESYREL) 50 MG tablet Take 1 tablet (50 mg total) by mouth at bedtime as needed for sleep. 08/12/14   Sanjuana KavaAgnes I Nwoko, NP   BP 156/81 mmHg  Pulse 88  Temp(Src) 98.1 F (36.7 C) (Oral)  Resp 18  SpO2 98% Physical Exam  Constitutional: He appears well-developed and well-nourished.  Patient appears more disheveled than normal  HENT:  Head: Normocephalic.  Eyes: Pupils are equal, round, and reactive to light.  Neck: Normal range of motion.  Cardiovascular: Normal rate.    Musculoskeletal: Normal range of motion.  Neurological: He is alert.  Skin: Skin is warm and dry.  Psychiatric: His behavior is normal. Cognition and memory are normal. He expresses inappropriate judgment. He expresses suicidal ideation. He expresses suicidal plans.  Nursing note and vitals reviewed.   ED Course  Procedures (including critical care time) Labs Review Labs Reviewed  ACETAMINOPHEN LEVEL  CBC  COMPREHENSIVE METABOLIC PANEL  ETHANOL  SALICYLATE LEVEL  URINE RAPID DRUG SCREEN (HOSP PERFORMED)    Imaging Review No results found.   EKG Interpretation None     Patient will have TTS evaluation MDM   Final diagnoses:  None         Earley FavorGail Maddex Garlitz, NP 10/01/14 [id number]  Azalia BilisKevin Campos, MD 10/01/14 [phone]

## 2014-10-01 NOTE — ED Notes (Addendum)
Written dc instructions reviewed w/ patient.  Pt encouraged to follow up w/ [facility], take his medications as directed, and follow up w/  OP services for treatment. Pt verbalized understanding.  Pt ambulatory w/o difficulty to DC window w/ mHt, belongings returned after leaving the area.

## 2014-10-06 ENCOUNTER — Emergency Department (HOSPITAL_COMMUNITY): Payer: Medicare Other

## 2014-10-06 ENCOUNTER — Encounter (HOSPITAL_COMMUNITY): Payer: Self-pay | Admitting: Emergency Medicine

## 2014-10-06 ENCOUNTER — Emergency Department (HOSPITAL_COMMUNITY)
Admission: EM | Admit: 2014-10-06 | Discharge: 2014-10-06 | Disposition: A | Discharge status: Home / Self Care | Payer: Medicare Other | Attending: Emergency Medicine | Admitting: Emergency Medicine

## 2014-10-06 DIAGNOSIS — Z8619 Personal history of other infectious and parasitic diseases: Secondary | ICD-10-CM | POA: Insufficient documentation

## 2014-10-06 DIAGNOSIS — R0989 Other specified symptoms and signs involving the circulatory and respiratory systems: Secondary | ICD-10-CM | POA: Insufficient documentation

## 2014-10-06 DIAGNOSIS — I10 Essential (primary) hypertension: Secondary | ICD-10-CM

## 2014-10-06 DIAGNOSIS — Z720 Tobacco use: Secondary | ICD-10-CM | POA: Insufficient documentation

## 2014-10-06 DIAGNOSIS — Z7982 Long term (current) use of aspirin: Secondary | ICD-10-CM | POA: Insufficient documentation

## 2014-10-06 DIAGNOSIS — F209 Schizophrenia, unspecified: Secondary | ICD-10-CM | POA: Insufficient documentation

## 2014-10-06 DIAGNOSIS — R05 Cough: Secondary | ICD-10-CM | POA: Insufficient documentation

## 2014-10-06 DIAGNOSIS — G629 Polyneuropathy, unspecified: Secondary | ICD-10-CM | POA: Insufficient documentation

## 2014-10-06 DIAGNOSIS — E119 Type 2 diabetes mellitus without complications: Secondary | ICD-10-CM | POA: Insufficient documentation

## 2014-10-06 DIAGNOSIS — Z590 Homelessness: Secondary | ICD-10-CM | POA: Insufficient documentation

## 2014-10-06 DIAGNOSIS — I509 Heart failure, unspecified: Secondary | ICD-10-CM | POA: Insufficient documentation

## 2014-10-06 DIAGNOSIS — Z79899 Other long term (current) drug therapy: Secondary | ICD-10-CM | POA: Insufficient documentation

## 2014-10-06 DIAGNOSIS — R059 Cough, unspecified: Secondary | ICD-10-CM

## 2014-10-06 DIAGNOSIS — G8929 Other chronic pain: Secondary | ICD-10-CM | POA: Insufficient documentation

## 2014-10-06 MED ORDER — DM-GUAIFENESIN ER 30-600 MG PO TB12
1.0000 | ORAL_TABLET | Freq: Two times a day (BID) | ORAL | Status: DC
  Administered 2014-10-06: 1 via ORAL
  Filled 2014-10-06 (×3): qty 1

## 2014-10-06 MED ORDER — ACETAMINOPHEN 500 MG PO TABS
1000.0000 mg | ORAL_TABLET | Freq: Once | ORAL | Status: AC
  Administered 2014-10-06: 1000 mg via ORAL
  Filled 2014-10-06: qty 2

## 2014-10-06 MED ORDER — GUAIFENESIN ER 600 MG PO TB12: 1200.0000 mg | ORAL_TABLET | Freq: Two times a day (BID) | ORAL | Status: DC

## 2014-10-06 NOTE — ED Provider Notes (Signed)
CSN: [id number]     Arrival date & time 10/06/14  [id number] History   First MD Initiated Contact with Patient 10/06/14 0636     Chief Complaint  Patient presents with  . Cough     (Consider location/radiation/quality/duration/timing/severity/associated sxs/prior Treatment) HPI   54 y.o. male presenting for productive cough x 2 days. Feels heavy chest congestion, coughing up white phlegm. Reports high crack use, been using for years. Has been in the rain all night and soiled his pants so wanted to come to hospital to clean himself and dry up. Denies hemoptysis, chest pain, fever or aches. Refuses CXR or other treatment. Wants bus pass.  Past Medical History  Diagnosis Date  . Diabetes mellitus without complication   . Hypertension   . Schizophrenia   . CHF (congestive heart failure)   . Neuropathy   . Polysubstance abuse   . Cocaine abuse   . Homelessness   . Hepatitis C   . Chronic foot pain    History reviewed. No pertinent past surgical history. Family History  Problem Relation Age of Onset  . Hypertension Other   . Diabetes Other    History  Substance Use Topics  . Smoking status: Current Every Day Smoker -- 1.00 packs/day for 20 years    Types: Cigarettes  . Smokeless tobacco: Current User  . Alcohol Use: Yes     Comment: Pt denies    Review of Systems  Ten systems reviewed and are negative for acute change, except as noted in the HPI.    Allergies  Haldol  Home Medications   Prior to Admission medications   Medication Sig Start Date End Date Taking? Authorizing Provider  ARIPiprazole (ABILIFY) 10 MG tablet Take 1 tablet (10 mg total) by mouth daily. For mood control 08/12/14   Sanjuana KavaAgnes I Nwoko, NP  ARIPiprazole 400 MG SUSR Inject 400 mg into the muscle every 30 (thirty) days. (Due to be given on 09-12-14 @ 08:00 am):For mood control Patient taking differently: Inject 400 mg into the muscle every 30 (thirty) days.  09/12/14   Sanjuana KavaAgnes I Nwoko, NP  aspirin 81 MG EC  tablet Take 1 tablet (81 mg total) by mouth daily. For heart health 08/12/14   Sanjuana KavaAgnes I Nwoko, NP  benazepril (LOTENSIN) 5 MG tablet Take 1 tablet (5 mg total) by mouth daily. For high blood pressure 08/12/14   Sanjuana KavaAgnes I Nwoko, NP  benztropine (COGENTIN) 2 MG tablet Take 1 tablet (2 mg total) by mouth 2 (two) times daily. For prevention of drug induced tremors 08/12/14   Sanjuana KavaAgnes I Nwoko, NP  cloNIDine (CATAPRES) 0.1 MG tablet Take 1 tablet (0.1 mg total) by mouth 2 (two) times daily. For high blood pressure 08/12/14   Sanjuana KavaAgnes I Nwoko, NP  furosemide (LASIX) 20 MG tablet Take 1 tablet (20 mg total) by mouth daily. For swellings/high blood pressure 08/12/14   Sanjuana KavaAgnes I Nwoko, NP  metFORMIN (GLUCOPHAGE) 500 MG tablet Take 1 tablet (500 mg total) by mouth daily with breakfast. For high blood sugar control 08/12/14   Sanjuana KavaAgnes I Nwoko, NP  tamsulosin (FLOMAX) 0.4 MG CAPS capsule Take 1 capsule (0.4 mg total) by mouth daily after supper. For prostate health 08/12/14   Sanjuana KavaAgnes I Nwoko, NP  traZODone (DESYREL) 50 MG tablet Take 1 tablet (50 mg total) by mouth at bedtime as needed for sleep. 08/12/14   Sanjuana KavaAgnes I Nwoko, NP   BP 194/97 mmHg  Pulse 85  Temp(Src) 97.4 F (36.3 C) (Oral)  Resp  20  SpO2 100% Physical Exam  Constitutional: He appears well-developed and well-nourished. No distress.  HENT:  Head: Normocephalic and atraumatic.  Eyes: Conjunctivae are normal. No scleral icterus.  Neck: Normal range of motion. Neck supple.  Cardiovascular: Normal rate, regular rhythm and normal heart sounds.   Pulmonary/Chest: Effort normal and breath sounds normal. No respiratory distress. He has no wheezes. He exhibits no tenderness.   No cough during exam . No rales, wheezing. Good air movement  Abdominal: Soft. There is no tenderness.  Musculoskeletal: He exhibits no edema.  Neurological: He is alert.  Skin: Skin is warm and dry. He is not diaphoretic.  Psychiatric: His behavior is normal.  Nursing note and vitals  reviewed.   ED Course  Procedures (including critical care time) Labs Review Labs Reviewed - No data to display  Imaging Review Dg Chest 2 View  10/06/2014   CLINICAL DATA:  54 year old with cough and chills.  EXAM: CHEST  2 VIEW  COMPARISON:  08/02/2014  FINDINGS: Again noted is a small metallic density along the anterior right lower chest which is unchanged. Both lungs are clear. Heart and mediastinum are within normal limits. The trachea is midline. No acute bone abnormality. Small nodular densities on both sides of the lower chest likely represent nipple shadows.  IMPRESSION: No active cardiopulmonary disease.  Probable bilateral nipple shadows as described.   Electronically Signed   By: Richarda Overlie M.D.   On: 10/06/2014 07:49     EKG Interpretation None      MDM   Final diagnoses:  Cough  Chest congestion  Essential hypertension    Patient given clothing to change. Afebrile, HDS.  He is noncompliant with all medications and does not wish to comply. He states: " I use crack, but I would take some cough medicine."  7:56 AM BP 194/97 mmHg  Pulse 85  Temp(Src) 97.4 F (36.3 C) (Oral)  Resp 20  SpO2 100% Pateint cxr without acute abnormality. +hypertension. Will refer to [employer] and wellness. Pt CXR negative for acute infiltrate. Patients symptoms are consistent with URI, likely viral etiology. Discussed that antibiotics are not indicated for viral infections. Pt will be discharged with symptomatic treatment.  Verbalizes understanding and is agreeable with plan. Pt is hemodynamically stable & in NAD prior to dc.     Arthor Captain, PA-C 10/06/14 0756  Arthor Captain, PA-C 10/06/14 1610  Zadie Rhine, MD 10/06/14 [phone]

## 2014-10-06 NOTE — Discharge Instructions (Signed)
Cough, Adult  A cough is a reflex that helps clear your throat and airways. It can help heal the body or may be a reaction to an irritated airway. A cough may only last 2 or 3 weeks (acute) or may last more than 8 weeks (chronic).  CAUSES Acute cough:  Viral or bacterial infections. Chronic cough:  Infections.  Allergies.  Asthma.  Post-nasal drip.  Smoking.  Heartburn or acid reflux.  Some medicines.  Chronic lung problems (COPD).  Cancer. SYMPTOMS   Cough.  Fever.  Chest pain.  Increased breathing rate.  High-pitched whistling sound when breathing (wheezing).  Colored mucus that you cough up (sputum). TREATMENT   A bacterial cough may be treated with antibiotic medicine.  A viral cough must run its course and will not respond to antibiotics.  Your caregiver may recommend other treatments if you have a chronic cough. HOME CARE INSTRUCTIONS   Only take over-the-counter or prescription medicines for pain, discomfort, or fever as directed by your caregiver. Use cough suppressants only as directed by your caregiver.  Use a cold steam vaporizer or humidifier in your bedroom or home to help loosen secretions.  Sleep in a semi-upright position if your cough is worse at night.  Rest as needed.  Stop smoking if you smoke. SEEK IMMEDIATE MEDICAL CARE IF:   You have pus in your sputum.  Your cough starts to worsen.  You cannot control your cough with suppressants and are losing sleep.  You begin coughing up blood.  You have difficulty breathing.  You develop pain which is getting worse or is uncontrolled with medicine.  You have a fever. MAKE SURE YOU:   Understand these instructions.  Will watch your condition.  Will get help right away if you are not doing well or get worse. Document Released: 12/14/2010 Document Revised: 09/09/2011 Document Reviewed: 12/14/2010 Exit[employer] Patient Information 2015 [employer]. This information is not intended  to replace advice given to you by your health care provider. Make sure you discuss any questions you have with your health care provider.  

## 2014-10-06 NOTE — ED Provider Notes (Signed)
Patient seen/examined in the Emergency Department in conjunction with Midlevel Provider Harris Patient reports cough Exam : awake/alert, no distress, lung sounds clear on my exam Plan: d/c home BP 194/97 mmHg  Pulse 75  Temp(Src) 97.4 F (36.3 C) (Oral)  Resp 18  SpO2 100%    Zadie Rhineonald Herbert Aguinaldo, MD 10/06/14 [phone]

## 2014-10-06 NOTE — ED Notes (Signed)
Patient transported to X-ray 

## 2014-10-06 NOTE — ED Notes (Signed)
Patient requested cough syrup, a shower, 3 disposable scrubs, a bus pass, and to be discharged by 0815. Provided patient wash cloths to clean self, provided donated pants and shirt. X-ray transporter arrived to prepare for transport, delayed so patient may clean up.

## 2014-10-06 NOTE — ED Notes (Signed)
Per EMS, patient comes in worried he has pneumonia. Lung sounds clear, no pain reported. Requested change of clothes and cough syrup on arrival.

## 2014-10-08 ENCOUNTER — Encounter (HOSPITAL_COMMUNITY): Payer: Self-pay | Admitting: Emergency Medicine

## 2014-10-08 ENCOUNTER — Emergency Department (HOSPITAL_COMMUNITY)
Admission: EM | Admit: 2014-10-08 | Discharge: 2014-10-08 | Disposition: A | Discharge status: Home / Self Care | Payer: Medicare Other | Attending: Emergency Medicine | Admitting: Emergency Medicine

## 2014-10-08 DIAGNOSIS — F200 Paranoid schizophrenia: Secondary | ICD-10-CM | POA: Diagnosis present

## 2014-10-08 DIAGNOSIS — F19951 Other psychoactive substance use, unspecified with psychoactive substance-induced psychotic disorder with hallucinations: Secondary | ICD-10-CM | POA: Diagnosis not present

## 2014-10-08 DIAGNOSIS — E119 Type 2 diabetes mellitus without complications: Secondary | ICD-10-CM | POA: Insufficient documentation

## 2014-10-08 DIAGNOSIS — Z720 Tobacco use: Secondary | ICD-10-CM | POA: Insufficient documentation

## 2014-10-08 DIAGNOSIS — Z79899 Other long term (current) drug therapy: Secondary | ICD-10-CM | POA: Insufficient documentation

## 2014-10-08 DIAGNOSIS — Z8619 Personal history of other infectious and parasitic diseases: Secondary | ICD-10-CM | POA: Insufficient documentation

## 2014-10-08 DIAGNOSIS — I10 Essential (primary) hypertension: Secondary | ICD-10-CM | POA: Insufficient documentation

## 2014-10-08 DIAGNOSIS — Z7982 Long term (current) use of aspirin: Secondary | ICD-10-CM | POA: Insufficient documentation

## 2014-10-08 DIAGNOSIS — I509 Heart failure, unspecified: Secondary | ICD-10-CM | POA: Insufficient documentation

## 2014-10-08 DIAGNOSIS — Z8669 Personal history of other diseases of the nervous system and sense organs: Secondary | ICD-10-CM | POA: Insufficient documentation

## 2014-10-08 DIAGNOSIS — F1420 Cocaine dependence, uncomplicated: Secondary | ICD-10-CM | POA: Diagnosis present

## 2014-10-08 LAB — COMPREHENSIVE METABOLIC PANEL
ALT: 12 U/L (ref 0–53)
AST: 19 U/L (ref 0–37)
Albumin: 4.3 g/dL (ref 3.5–5.2)
Alkaline Phosphatase: 85 U/L (ref 39–117)
Anion gap: 8 (ref 5–15)
BUN: 12 mg/dL (ref 6–23)
CALCIUM: 9.2 mg/dL (ref 8.4–10.5)
CHLORIDE: 103 mmol/L (ref 96–112)
CO2: 27 mmol/L (ref 19–32)
Creatinine, Ser: 0.87 mg/dL (ref 0.50–1.35)
GFR calc non Af Amer: >90 mL/min (ref >90)
Glucose, Bld: 140 mg/dL — ABNORMAL HIGH (ref 70–99)
POTASSIUM: 3 mmol/L — AB (ref 3.5–5.1)
Sodium: 138 mmol/L (ref 135–145)
Total Bilirubin: 0.5 mg/dL (ref 0.3–1.2)
Total Protein: 7.5 g/dL (ref 6.0–8.3)

## 2014-10-08 LAB — ETHANOL: Alcohol, Ethyl (B): <5 mg/dL (ref 0–9)

## 2014-10-08 LAB — CBC
HCT: 41 % (ref 39.0–52.0)
Hemoglobin: 12.8 g/dL — ABNORMAL LOW (ref 13.0–17.0)
MCH: 28.3 pg (ref 26.0–34.0)
MCHC: 31.2 g/dL (ref 30.0–36.0)
MCV: 90.5 fL (ref 78.0–100.0)
Platelets: 180 10*3/uL (ref 150–400)
RBC: 4.53 MIL/uL (ref 4.22–5.81)
RDW: 14 % (ref 11.5–15.5)
WBC: 6.7 10*3/uL (ref 4.0–10.5)

## 2014-10-08 LAB — SALICYLATE LEVEL

## 2014-10-08 LAB — ACETAMINOPHEN LEVEL: Acetaminophen (Tylenol), Serum: <10 ug/mL — ABNORMAL LOW (ref 10–30)

## 2014-10-08 MED ORDER — FUROSEMIDE 40 MG PO TABS
20.0000 mg | ORAL_TABLET | Freq: Every day | ORAL | Status: DC
  Administered 2014-10-08: 20 mg via ORAL
  Filled 2014-10-08: qty 1

## 2014-10-08 MED ORDER — INSULIN ASPART 100 UNIT/ML ~~LOC~~ SOLN: 0.0000 [IU] | Freq: Every day | SUBCUTANEOUS | Status: DC

## 2014-10-08 MED ORDER — IBUPROFEN 200 MG PO TABS: 600.0000 mg | ORAL_TABLET | Freq: Three times a day (TID) | ORAL | Status: DC | PRN

## 2014-10-08 MED ORDER — BENAZEPRIL HCL 5 MG PO TABS
5.0000 mg | ORAL_TABLET | Freq: Every day | ORAL | Status: DC
  Filled 2014-10-08: qty 1

## 2014-10-08 MED ORDER — LORAZEPAM 1 MG PO TABS: 1.0000 mg | ORAL_TABLET | Freq: Three times a day (TID) | ORAL | Status: DC | PRN

## 2014-10-08 MED ORDER — INSULIN ASPART 100 UNIT/ML ~~LOC~~ SOLN: 0.0000 [IU] | Freq: Three times a day (TID) | SUBCUTANEOUS | Status: DC

## 2014-10-08 MED ORDER — POTASSIUM CHLORIDE CRYS ER 20 MEQ PO TBCR
40.0000 meq | EXTENDED_RELEASE_TABLET | Freq: Every day | ORAL | Status: DC
  Administered 2014-10-08: 40 meq via ORAL
  Filled 2014-10-08: qty 2

## 2014-10-08 MED ORDER — ACETAMINOPHEN 325 MG PO TABS: 650.0000 mg | ORAL_TABLET | ORAL | Status: DC | PRN

## 2014-10-08 MED ORDER — CLONIDINE HCL 0.1 MG PO TABS
0.1000 mg | ORAL_TABLET | Freq: Two times a day (BID) | ORAL | Status: DC
  Administered 2014-10-08: 0.1 mg via ORAL
  Filled 2014-10-08: qty 1

## 2014-10-08 MED ORDER — ONDANSETRON HCL 4 MG PO TABS
4.0000 mg | ORAL_TABLET | Freq: Three times a day (TID) | ORAL | Status: DC | PRN
Start: 2014-10-08 — End: 2014-10-08

## 2014-10-08 NOTE — ED Notes (Signed)
Pt brought to ED by EMS from street. Pt c/o SI and requesting a shower, pt states he has been in a trash can hiding from dogs and wolves in the neighborhood. Pt requesting toothbrush, stating he has not brushed his teeth in 6 years.  Pt presents with rambling pressured speech.

## 2014-10-08 NOTE — ED Notes (Signed)
Pt. And personal belongings wanded by security. In scrubs, cooperative .

## 2014-10-08 NOTE — ED Notes (Signed)
Pt ready for discharge per Catha NottinghamJamison, NP.

## 2014-10-08 NOTE — Consult Note (Signed)
[place] [facility] Consult   Reason for Consult:  Hallucinations, cocaine abuse Referring Physician:  EDP Patient Identification: [name] MRN:  [id number] Principal Diagnosis: Chronic paranoid schizophrenia Diagnosis:   Patient Active Problem List   Diagnosis Date Noted  . 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 Chronic paranoid schizophrenia [F20.0] 09/07/2014    Priority: High  . Cocaine use disorder, severe, dependence [F14.20]     Priority: High  . 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 Acute CHF (congestive heart failure) [I50.9] 07/29/2014  . Essential hypertension, benign [I10] 03/28/2013  . Diabetes mellitus [E11.9] 03/15/2013    Total Time spent with patient: 45 minutes  Subjective:   [name] is a 54 y.o. male patient stable for discharge.  HPI:  The patient came to the ED requesting a shower and chance to brush his teeth.   He had been abusing cocaine with some hallucinations.  [name] showered and completed his ADLs.  He has eaten.  Today, he denies suicidal/homicidal ideations, hallucinations, and alcohol abuse.  He does not want rehab and has his own outpatient providers at [facility]. HPI Elements:   Location:  generalized. Quality:  acute. Severity:  mild. Timing:  intermittent. Duration:  brief. Context:  cocaine abuse.  Past Medical History:  Past Medical History  Diagnosis Date  . Diabetes mellitus without complication   . Hypertension   . Schizophrenia   . CHF (congestive heart failure)   . Neuropathy   . Polysubstance abuse   . Cocaine abuse   . Homelessness   . Hepatitis C   . Chronic foot pain    History reviewed. No pertinent past surgical history. Family History:  Family History  Problem Relation Age of Onset  . Hypertension Other   . Diabetes Other    Social History:  History  Alcohol Use  . Yes    Comment: Pt  denies     History  Drug Use  . 7.00 per week  . Special: "Crack" cocaine, Cocaine, Marijuana    Comment: Cocaine tonight, Marijuana "a long time"    History   Social History  . Marital Status: Divorced    Spouse Name: N/A  . Number of Children: N/A  . Years of Education: N/A   Social History Main Topics  . Smoking status: Current Every Day Smoker -- 1.00 packs/day for 20 years    Types: Cigarettes  . Smokeless tobacco: Current User  . Alcohol Use: Yes     Comment: Pt denies  . Drug Use: 7.00 per week    Special: "Crack" cocaine, Cocaine, Marijuana     Comment: Cocaine tonight, Marijuana "a long time"  . Sexual Activity: No   Other Topics Concern  . None   Social History Narrative   ** Merged History Encounter **       Additional Social History:                          Allergies:   Allergies  Allergen Reactions  . Haldol [Haloperidol] Other (See Comments)    Muscle spasms, loss of voluntary movement. However, pt has taken Thorazine on multiple occasions with no adverse effects.     Labs:  Results for orders placed or performed during the hospital encounter of 10/08/14 (from the past 48 hour(s))  Acetaminophen level     Status: Abnormal   Collection Time: 10/08/14  7:50 AM  Result Value Ref Range   Acetaminophen (Tylenol), Serum <10.0 (L) 10 - 30 ug/mL    Comment:        THERAPEUTIC CONCENTRATIONS VARY SIGNIFICANTLY. A RANGE OF 10-30 ug/mL MAY BE AN EFFECTIVE CONCENTRATION FOR MANY PATIENTS. HOWEVER, SOME ARE BEST TREATED AT CONCENTRATIONS OUTSIDE THIS RANGE. ACETAMINOPHEN CONCENTRATIONS >150 ug/mL AT 4 HOURS AFTER INGESTION AND >50 ug/mL AT 12 HOURS AFTER INGESTION ARE OFTEN ASSOCIATED WITH TOXIC REACTIONS.   Ethanol (ETOH)     Status: None   Collection Time: 10/08/14  7:50 AM  Result Value Ref Range   Alcohol, Ethyl (B) <5 0 - 9 mg/dL    Comment:        LOWEST DETECTABLE LIMIT FOR SERUM ALCOHOL IS 11 mg/dL FOR MEDICAL PURPOSES ONLY    Salicylate level     Status: None   Collection Time: 10/08/14  7:50 AM  Result Value Ref Range   Salicylate Lvl <9.7 2.8 - 20.0 mg/dL  CBC     Status: Abnormal   Collection Time: 10/08/14  7:51 AM  Result Value Ref Range   WBC 6.7 4.0 - 10.5 K/uL   RBC 4.53 4.22 - 5.81 MIL/uL   Hemoglobin 12.8 (L) 13.0 - 17.0 g/dL   HCT 41.0 39.0 - 52.0 %   MCV 90.5 78.0 - 100.0 fL   MCH 28.3 26.0 - 34.0 pg   MCHC 31.2 30.0 - 36.0 g/dL   RDW 14.0 11.5 - 15.5 %   Platelets 180 150 - 400 K/uL  Comprehensive metabolic panel     Status: Abnormal   Collection Time: 10/08/14  7:51 AM  Result Value Ref Range   Sodium 138 135 - 145 mmol/L   Potassium 3.0 (L) 3.5 - 5.1 mmol/L   Chloride 103 96 - 112 mmol/L   CO2 27 19 - 32 mmol/L   Glucose, Bld 140 (H) 70 - 99 mg/dL   BUN 12 6 - 23 mg/dL   Creatinine, Ser 0.87 0.50 - 1.35 mg/dL   Calcium 9.2 8.4 - 10.5 mg/dL   Total Protein 7.5 6.0 - 8.3 g/dL   Albumin 4.3 3.5 - 5.2 g/dL   AST 19 0 - 37 U/L   ALT 12 0 - 53 U/L   Alkaline Phosphatase 85 39 - 117 U/L   Total Bilirubin 0.5 0.3 - 1.2 mg/dL   GFR calc non Af Amer >90 >90 mL/min   GFR calc Af Amer >90 >90 mL/min    Comment: (NOTE) The eGFR has been calculated using the CKD EPI equation. This calculation has not been validated in all clinical situations. eGFR's persistently <90 mL/min signify possible Chronic Kidney Disease.    Anion gap 8 5 - 15    Vitals: Blood pressure 161/92, pulse 89, temperature 98.1 F (36.7 C), temperature source Oral, resp. rate 18, SpO2 99 %.  Risk to Self: Is patient at risk for suicide?: Yes Risk to Others:   Prior Inpatient Therapy:   Prior Outpatient Therapy:    Current Facility-Administered Medications  Medication Dose Route Frequency Provider Last Rate Last Dose  . potassium chloride SA (K-DUR,KLOR-CON) CR tablet 40 mEq  40 mEq Oral Daily Patrecia Pour, NP       Current Outpatient Prescriptions  Medication Sig Dispense Refill  . ARIPiprazole (ABILIFY) 10  MG tablet Take 1 tablet (10 mg total) by mouth daily. For mood control 30 tablet 0  . ARIPiprazole 400 MG SUSR Inject 400 mg into the muscle every 30 (  thirty) days. (Due to be given on 09-12-14 @ 08:00 am):For mood control (Patient taking differently: Inject 400 mg into the muscle every 30 (thirty) days. ) 1 each 0  . aspirin 81 MG EC tablet Take 1 tablet (81 mg total) by mouth daily. For heart health 30 tablet 3  . benazepril (LOTENSIN) 5 MG tablet Take 1 tablet (5 mg total) by mouth daily. For high blood pressure 30 tablet 0  . benztropine (COGENTIN) 2 MG tablet Take 1 tablet (2 mg total) by mouth 2 (two) times daily. For prevention of drug induced tremors 60 tablet 0  . cloNIDine (CATAPRES) 0.1 MG tablet Take 1 tablet (0.1 mg total) by mouth 2 (two) times daily. For high blood pressure 60 tablet 0  . furosemide (LASIX) 20 MG tablet Take 1 tablet (20 mg total) by mouth daily. For swellings/high blood pressure 30 tablet 3  . guaiFENesin (MUCINEX) 600 MG 12 hr tablet Take 2 tablets (1,200 mg total) by mouth 2 (two) times daily. 30 tablet 0  . metFORMIN (GLUCOPHAGE) 500 MG tablet Take 1 tablet (500 mg total) by mouth daily with breakfast. For high blood sugar control 30 tablet 0  . tamsulosin (FLOMAX) 0.4 MG CAPS capsule Take 1 capsule (0.4 mg total) by mouth daily after supper. For prostate health 30 capsule 0  . traZODone (DESYREL) 50 MG tablet Take 1 tablet (50 mg total) by mouth at bedtime as needed for sleep. 30 tablet 0    Musculoskeletal: Strength & Muscle Tone: within normal limits Gait & Station: normal Patient leans: N/A  Psychiatric Specialty Exam:     Blood pressure 161/92, pulse 89, temperature 98.1 F (36.7 C), temperature source Oral, resp. rate 18, SpO2 99 %.There is no weight on file to calculate BMI.  General Appearance: Casual  Eye Contact::  Good  Speech:  Normal Rate  Volume:  Normal  Mood:  Euthymic  Affect:  Blunt  Thought Process:  Coherent  Orientation:  Full  (Time, Place, and Person)  Thought Content:  WDL  Suicidal Thoughts:  No  Homicidal Thoughts:  No  Memory:  Immediate;   Good Recent;   Fair Remote;   Good  Judgement:  Fair  Insight:  Fair  Psychomotor Activity:  Normal  Concentration:  Good  Recall:  Good  Fund of Knowledge:Fair  Language: Good  Akathisia:  No  Handed:  Right  AIMS (if indicated):     Assets:  Leisure Time Physical Health Resilience  ADL's:  Intact  Cognition: WNL  Sleep:      Medical Decision Making: Review of Psycho-Social Stressors (1), Review or order clinical lab tests (1) and Review of Medication Regimen & Side Effects (2)  Treatment Plan Summary: Daily contact with patient to assess and evaluate symptoms and progress in treatment, Medication management and Plan discharge home with follow-up at [facility], his regular providers  Plan:  No evidence of imminent risk to self or others at present.   Disposition: Discharge home with follow-up at [facility], his regular providers  Waylan Boga, [place] 10/08/2014 9:15 AM

## 2014-10-08 NOTE — ED Provider Notes (Signed)
CSN: [id number]     Arrival date & time 10/08/14  4098 History   First MD Initiated Contact with Patient 10/08/14 0701     Chief Complaint  Patient presents with  . Suicidal     (Consider location/radiation/quality/duration/timing/severity/associated sxs/prior Treatment) Patient is a 54 y.o. male presenting with mental health disorder.  Mental Health Problem Presenting symptoms: paranoid behavior and suicidal thoughts   Degree of incapacity (severity):  Severe Onset quality:  Gradual Duration: a few days. Timing:  Constant Progression:  Worsening Chronicity:  Recurrent Context: drug abuse and noncompliance   Relieved by:  Nothing Worsened by:  Drugs Associated symptoms comment:  Foot pain Risk factors: hx of mental illness     Past Medical History  Diagnosis Date  . Diabetes mellitus without complication   . Hypertension   . Schizophrenia   . CHF (congestive heart failure)   . Neuropathy   . Polysubstance abuse   . Cocaine abuse   . Homelessness   . Hepatitis C   . Chronic foot pain    History reviewed. No pertinent past surgical history. Family History  Problem Relation Age of Onset  . Hypertension Other   . Diabetes Other    History  Substance Use Topics  . Smoking status: Current Every Day Smoker -- 1.00 packs/day for 20 years    Types: Cigarettes  . Smokeless tobacco: Current User  . Alcohol Use: Yes     Comment: Pt denies    Review of Systems  Psychiatric/Behavioral: Positive for suicidal ideas and paranoia.  All other systems reviewed and are negative.     Allergies  Haldol  Home Medications   Prior to Admission medications   Medication Sig Start Date End Date Taking? Authorizing Provider  ARIPiprazole (ABILIFY) 10 MG tablet Take 1 tablet (10 mg total) by mouth daily. For mood control 08/12/14   Sanjuana Kava, NP  ARIPiprazole 400 MG SUSR Inject 400 mg into the muscle every 30 (thirty) days. (Due to be given on 09-12-14 @ 08:00 am):For mood  control Patient taking differently: Inject 400 mg into the muscle every 30 (thirty) days.  09/12/14   Sanjuana Kava, NP  aspirin 81 MG EC tablet Take 1 tablet (81 mg total) by mouth daily. For heart health 08/12/14   Sanjuana Kava, NP  benazepril (LOTENSIN) 5 MG tablet Take 1 tablet (5 mg total) by mouth daily. For high blood pressure 08/12/14   Sanjuana Kava, NP  benztropine (COGENTIN) 2 MG tablet Take 1 tablet (2 mg total) by mouth 2 (two) times daily. For prevention of drug induced tremors 08/12/14   Sanjuana Kava, NP  cloNIDine (CATAPRES) 0.1 MG tablet Take 1 tablet (0.1 mg total) by mouth 2 (two) times daily. For high blood pressure 08/12/14   Sanjuana Kava, NP  furosemide (LASIX) 20 MG tablet Take 1 tablet (20 mg total) by mouth daily. For swellings/high blood pressure 08/12/14   Sanjuana Kava, NP  guaiFENesin (MUCINEX) 600 MG 12 hr tablet Take 2 tablets (1,200 mg total) by mouth 2 (two) times daily. 10/06/14   Arthor Captain, PA-C  metFORMIN (GLUCOPHAGE) 500 MG tablet Take 1 tablet (500 mg total) by mouth daily with breakfast. For high blood sugar control 08/12/14   Sanjuana Kava, NP  tamsulosin (FLOMAX) 0.4 MG CAPS capsule Take 1 capsule (0.4 mg total) by mouth daily after supper. For prostate health 08/12/14   Sanjuana Kava, NP  traZODone (DESYREL) 50 MG tablet Take  1 tablet (50 mg total) by mouth at bedtime as needed for sleep. 08/12/14   Sanjuana KavaAgnes I Nwoko, NP   BP 161/92 mmHg  Pulse 89  Temp(Src) 98.1 F (36.7 C) (Oral)  Resp 18  SpO2 99% Physical Exam  Constitutional: He is oriented to person, place, and time. He appears well-developed and well-nourished. No distress.  HENT:  Head: Normocephalic and atraumatic.  Eyes: Conjunctivae are normal. No scleral icterus.  Neck: Neck supple.  Cardiovascular: Normal rate and intact distal pulses.   Pulmonary/Chest: Effort normal. No stridor. No respiratory distress.  Abdominal: Normal appearance. He exhibits no distension.  Neurological: He is alert and  oriented to person, place, and time.  Skin: Skin is warm and dry. No rash noted.  Psychiatric: He has a normal mood and affect. His behavior is normal. His speech is rapid and/or pressured and tangential. Thought content is paranoid. He expresses suicidal ideation.  Nursing note and vitals reviewed.   ED Course  Procedures (including critical care time) Labs Review Labs Reviewed  ACETAMINOPHEN LEVEL - Abnormal; Notable for the following:    Acetaminophen (Tylenol), Serum <10.0 (*)    All other components within normal limits  CBC - Abnormal; Notable for the following:    Hemoglobin 12.8 (*)    All other components within normal limits  COMPREHENSIVE METABOLIC PANEL - Abnormal; Notable for the following:    Potassium 3.0 (*)    Glucose, Bld 140 (*)    All other components within normal limits  ETHANOL  SALICYLATE LEVEL    Imaging Review No results found.   EKG Interpretation None      MDM   Final diagnoses:  Drug hallucinosis  Chronic paranoid schizophrenia  Cocaine use disorder, severe, dependence    54 yo male presenting with paranoid thought process and suicidal ideation.  He thinks the government is after him, trying to kill him.  He also reports being in a trash can, hiding from dogs, and thinks he was exposed to poison while hiding.  He endorses SI.  He also complains of chronic foot pain, "I just need some  Tylenol for my feet."  Plan labs, TTS consult.  No signs or symptoms of organic disease.   Psychiatry evaluated patient and has discharged for further outpatient treatment.   Blake DivineJohn Meghana Tullo, MD 10/08/14 [phone]

## 2014-10-08 NOTE — ED Notes (Signed)
Pt calm and resting quietly. Eating breakfast.  

## 2014-10-08 NOTE — [facility] Suicide Risk Assessment (Signed)
Suicide Risk Assessment  Discharge Assessment   [facility]   Demographic Factors:  Male  Total Time spent with patient: 45 minutes  Musculoskeletal: Strength & Muscle Tone: within normal limits Gait & Station: normal Patient leans: N/A  Psychiatric Specialty Exam:     Blood pressure 161/92, pulse 89, temperature 98.1 F (36.7 C), temperature source Oral, resp. rate 18, SpO2 99 %.There is no weight on file to calculate BMI.  General Appearance: Casual  Eye Contact::  Good  Speech:  Normal Rate  Volume:  Normal  Mood:  Euthymic  Affect:  Blunt  Thought Process:  Coherent  Orientation:  Full (Time, Place, and Person)  Thought Content:  WDL  Suicidal Thoughts:  No  Homicidal Thoughts:  No  Memory:  Immediate;   Good Recent;   Fair Remote;   Good  Judgement:  Fair  Insight:  Fair  Psychomotor Activity:  Normal  Concentration:  Good  Recall:  Good  Fund of Knowledge:Fair  Language: Good  Akathisia:  No  Handed:  Right  AIMS (if indicated):     Assets:  Leisure Time Physical Health Resilience  ADL's:  Intact  Cognition: WNL  Sleep:          Has this patient used any form of tobacco in the last 30 days? (Cigarettes, Smokeless Tobacco, Cigars, and/or Pipes) Yes, A prescription for an FDA-approved tobacco cessation medication was offered at discharge and the patient refused  Mental Status Per Nursing Assessment::   On Admission:   Cocaine abuse with hallucinations  Current Mental Status by Physician: NA  Loss Factors: NA  Historical Factors: NA  Risk Reduction Factors:   Positive therapeutic relationship  Continued Clinical Symptoms:  None  Cognitive Features That Contribute To Risk:  None    Suicide Risk:  Minimal: No identifiable suicidal ideation.  Patients presenting with no risk factors but with morbid ruminations; may be classified as minimal risk based on the severity of the depressive symptoms  Principal Problem:  Chronic paranoid schizophrenia Discharge Diagnoses:  Patient Active Problem List   Diagnosis Date Noted  . 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 Chronic paranoid schizophrenia [F20.0] 09/07/2014    Priority: High  . Cocaine use disorder, severe, dependence [F14.20]     Priority: High  . 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 Acute CHF (congestive heart failure) [I50.9] 07/29/2014  . Essential hypertension, benign [I10] 03/28/2013  . Diabetes mellitus [E11.9] 03/15/2013      Plan Of Care/Follow-up recommendations:  Activity:  as tolerated Diet:  heart healthy diet  Is patient on multiple antipsychotic therapies at discharge:  No   Has Patient had three or more failed trials of antipsychotic monotherapy by history:  No  Recommended Plan for Multiple Antipsychotic Therapies: NA    LORD, JAMISON, PMH-NP 10/08/2014, 9:32 AM

## 2014-10-08 NOTE — ED Notes (Signed)
Pt. Headed  to shower room upon arrival to TCU, alert and oriented , no s/s of distress noted.

## 2014-10-08 NOTE — ED Notes (Signed)
MD at bedside. 

## 2014-10-13 ENCOUNTER — Encounter (HOSPITAL_COMMUNITY): Payer: Self-pay | Admitting: *Deleted

## 2014-10-13 ENCOUNTER — Emergency Department (HOSPITAL_COMMUNITY)
Admission: EM | Admit: 2014-10-13 | Discharge: 2014-10-13 | Disposition: A | Discharge status: Home / Self Care | Payer: Medicare Other | Attending: Emergency Medicine | Admitting: Emergency Medicine

## 2014-10-13 DIAGNOSIS — F1410 Cocaine abuse, uncomplicated: Secondary | ICD-10-CM | POA: Insufficient documentation

## 2014-10-13 DIAGNOSIS — I10 Essential (primary) hypertension: Secondary | ICD-10-CM | POA: Insufficient documentation

## 2014-10-13 DIAGNOSIS — F419 Anxiety disorder, unspecified: Secondary | ICD-10-CM | POA: Insufficient documentation

## 2014-10-13 DIAGNOSIS — G8929 Other chronic pain: Secondary | ICD-10-CM | POA: Insufficient documentation

## 2014-10-13 DIAGNOSIS — Z590 Homelessness: Secondary | ICD-10-CM | POA: Insufficient documentation

## 2014-10-13 DIAGNOSIS — F209 Schizophrenia, unspecified: Secondary | ICD-10-CM | POA: Insufficient documentation

## 2014-10-13 DIAGNOSIS — E119 Type 2 diabetes mellitus without complications: Secondary | ICD-10-CM | POA: Insufficient documentation

## 2014-10-13 DIAGNOSIS — Z79899 Other long term (current) drug therapy: Secondary | ICD-10-CM | POA: Insufficient documentation

## 2014-10-13 DIAGNOSIS — I509 Heart failure, unspecified: Secondary | ICD-10-CM | POA: Insufficient documentation

## 2014-10-13 DIAGNOSIS — Z7982 Long term (current) use of aspirin: Secondary | ICD-10-CM | POA: Insufficient documentation

## 2014-10-13 DIAGNOSIS — Z8619 Personal history of other infectious and parasitic diseases: Secondary | ICD-10-CM | POA: Insufficient documentation

## 2014-10-13 DIAGNOSIS — Z720 Tobacco use: Secondary | ICD-10-CM | POA: Insufficient documentation

## 2014-10-13 LAB — RAPID URINE DRUG SCREEN, HOSP PERFORMED
Amphetamines: NOT DETECTED
BARBITURATES: NOT DETECTED
Benzodiazepines: NOT DETECTED
COCAINE: POSITIVE — AB
Opiates: NOT DETECTED
TETRAHYDROCANNABINOL: NOT DETECTED

## 2014-10-13 LAB — CBC WITH DIFFERENTIAL/PLATELET
BASOS ABS: 0 10*3/uL (ref 0.0–0.1)
Basophils Relative: 0 % (ref 0–1)
Eosinophils Absolute: 0.1 10*3/uL (ref 0.0–0.7)
Eosinophils Relative: 1 % (ref 0–5)
HCT: 37 % — ABNORMAL LOW (ref 39.0–52.0)
Hemoglobin: 11.5 g/dL — ABNORMAL LOW (ref 13.0–17.0)
LYMPHS PCT: 19 % (ref 12–46)
Lymphs Abs: 1.4 10*3/uL (ref 0.7–4.0)
MCH: 28.5 pg (ref 26.0–34.0)
MCHC: 31.1 g/dL (ref 30.0–36.0)
MCV: 91.6 fL (ref 78.0–100.0)
Monocytes Absolute: 0.4 10*3/uL (ref 0.1–1.0)
Monocytes Relative: 5 % (ref 3–12)
NEUTROS PCT: 75 % (ref 43–77)
Neutro Abs: 5.4 10*3/uL (ref 1.7–7.7)
PLATELETS: 217 10*3/uL (ref 150–400)
RBC: 4.04 MIL/uL — ABNORMAL LOW (ref 4.22–5.81)
RDW: 13.9 % (ref 11.5–15.5)
WBC: 7.2 10*3/uL (ref 4.0–10.5)

## 2014-10-13 LAB — I-STAT CHEM 8, ED
BUN: 13 mg/dL (ref 6–23)
CALCIUM ION: 1.3 mmol/L — AB (ref 1.12–1.23)
CREATININE: 0.9 mg/dL (ref 0.50–1.35)
Chloride: 101 mmol/L (ref 96–112)
Glucose, Bld: 186 mg/dL — ABNORMAL HIGH (ref 70–99)
HCT: 39 % (ref 39.0–52.0)
Hemoglobin: 13.3 g/dL (ref 13.0–17.0)
Potassium: 3.7 mmol/L (ref 3.5–5.1)
Sodium: 143 mmol/L (ref 135–145)
TCO2: 27 mmol/L (ref 0–100)

## 2014-10-13 LAB — ETHANOL: Alcohol, Ethyl (B): <5 mg/dL (ref 0–9)

## 2014-10-13 IMAGING — CR DG CHEST 2V
2 series · 2 of 2 positions shown · non-contrast
Comparison: 12/16/2013.

CLINICAL DATA: Initial encounter are, shortness of breath and chest
pain.

EXAM:
CHEST  2 VIEW

[w chest pa]
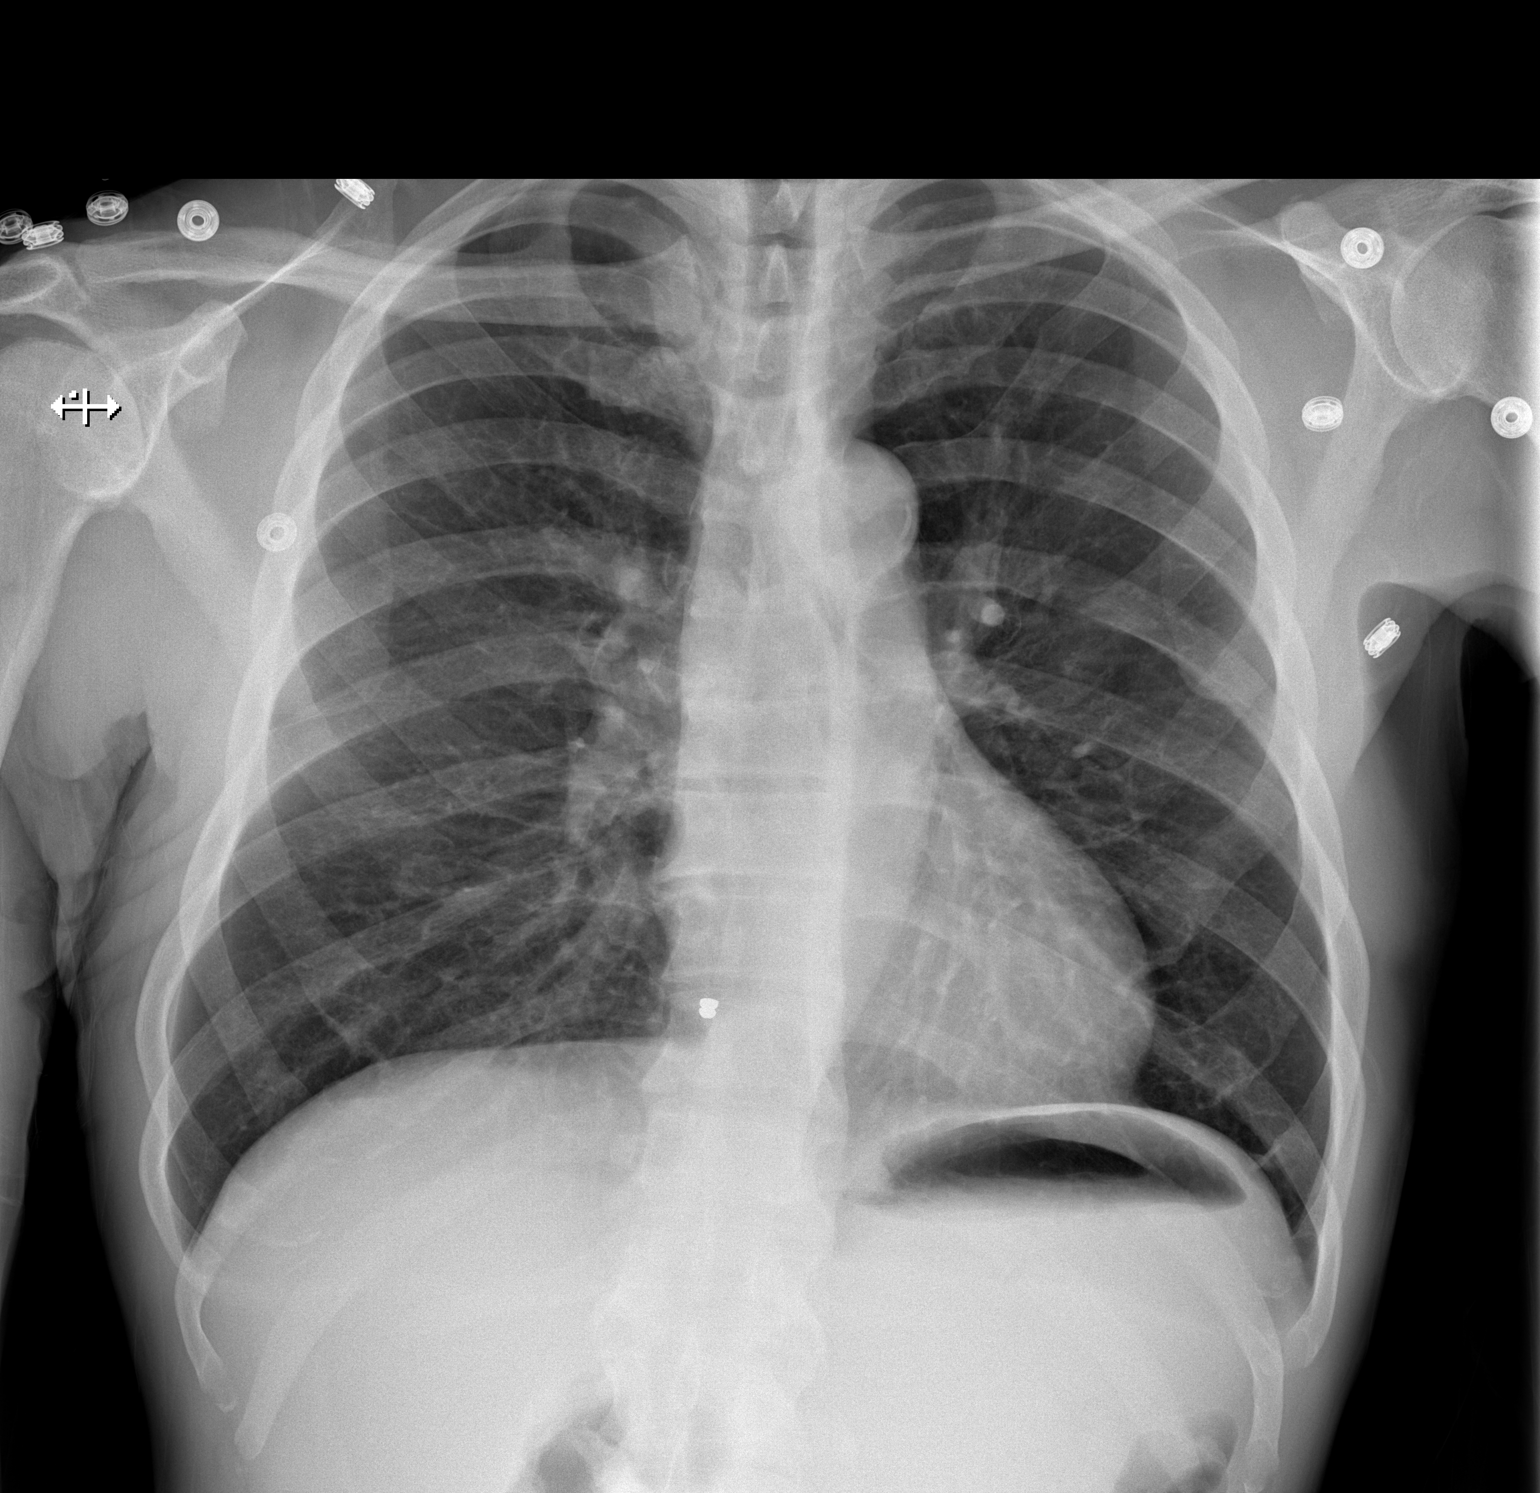

[w chest lat]
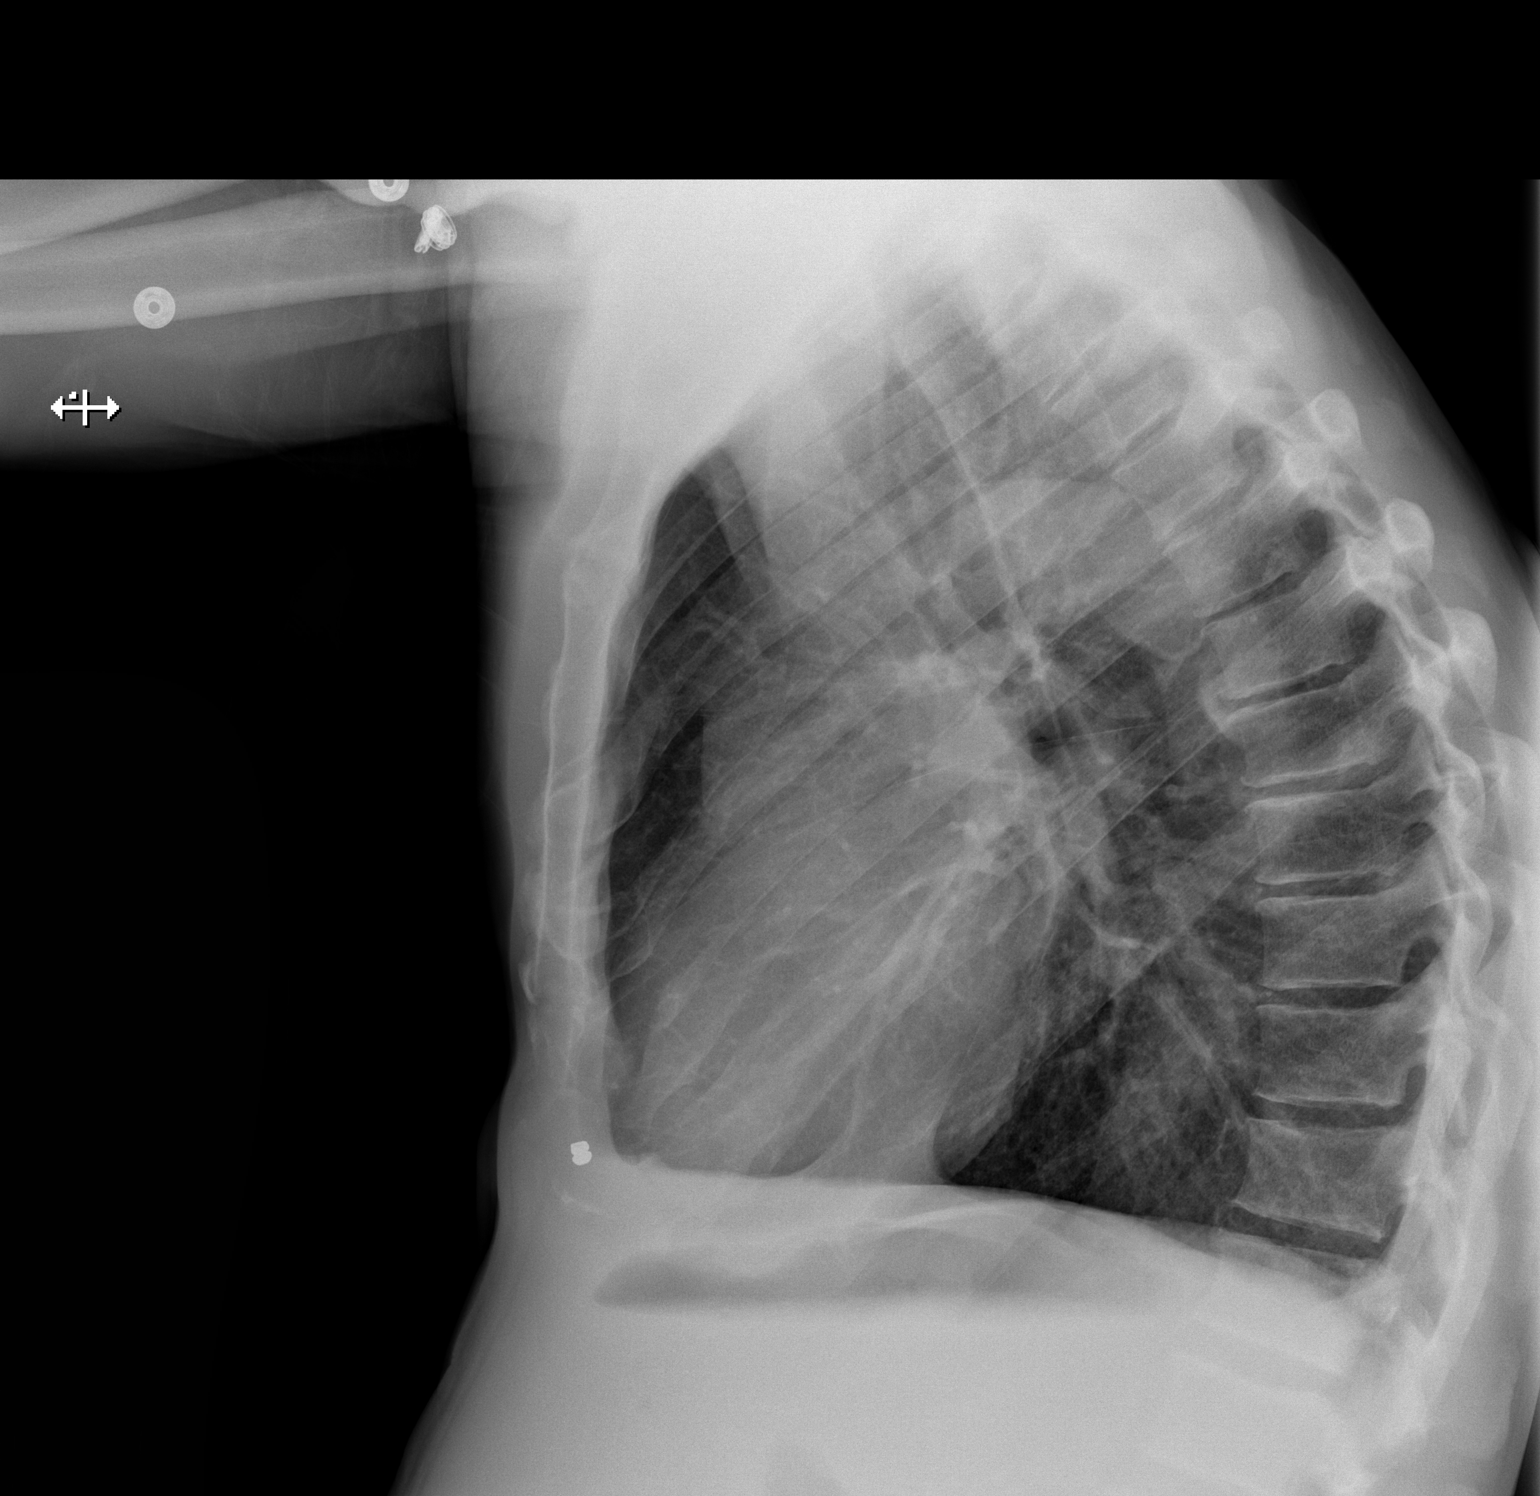

[2 of 2 positions shown; findings below may reference images not displayed]

FINDINGS: Trachea is midline. Heart size normal. Lungs are clear. No pleural
fluid. A metallic density projects inferior to the sternum, as on
the prior exam.
IMPRESSION: No acute findings.

## 2014-10-13 MED ORDER — METFORMIN HCL 500 MG PO TABS
500.0000 mg | ORAL_TABLET | Freq: Every day | ORAL | Status: DC
  Filled 2014-10-13 (×2): qty 1

## 2014-10-13 MED ORDER — TRAZODONE HCL 50 MG PO TABS: 50.0000 mg | ORAL_TABLET | Freq: Every evening | ORAL | Status: DC | PRN

## 2014-10-13 MED ORDER — BENAZEPRIL HCL 5 MG PO TABS
5.0000 mg | ORAL_TABLET | Freq: Every day | ORAL | Status: DC
  Filled 2014-10-13: qty 1

## 2014-10-13 MED ORDER — FUROSEMIDE 40 MG PO TABS: 20.0000 mg | ORAL_TABLET | Freq: Every day | ORAL | Status: DC

## 2014-10-13 MED ORDER — BENZTROPINE MESYLATE 1 MG PO TABS: 2.0000 mg | ORAL_TABLET | Freq: Two times a day (BID) | ORAL | Status: DC

## 2014-10-13 MED ORDER — ASPIRIN EC 81 MG PO TBEC
81.0000 mg | DELAYED_RELEASE_TABLET | Freq: Every day | ORAL | Status: DC
Start: 2014-10-13 — End: 2014-10-13
  Filled 2014-10-13: qty 1

## 2014-10-13 MED ORDER — CLONIDINE HCL 0.1 MG PO TABS: 0.1000 mg | ORAL_TABLET | Freq: Two times a day (BID) | ORAL | Status: DC

## 2014-10-13 NOTE — ED Notes (Signed)
Per EMS pt walked up to truck eating ice cream, stated he has been doing crack tonight and has a hx of CHF, stated he ran out of money to buy crack so he wanted to come to the hospital.

## 2014-10-13 NOTE — ED Notes (Signed)
Pt refused to e-sign or allow VS to be obtained.

## 2014-10-13 NOTE — ED Provider Notes (Signed)
CSN: [id number]     Arrival date & time 10/13/14  0315 History   First MD Initiated Contact with Patient 10/13/14 0355     Chief Complaint  Patient presents with  . crack use      (Consider location/radiation/quality/duration/timing/severity/associated sxs/prior Treatment) The history is provided by the patient.    Past Medical History  Diagnosis Date  . Diabetes mellitus without complication   . Hypertension   . Schizophrenia   . CHF (congestive heart failure)   . Neuropathy   . Polysubstance abuse   . Cocaine abuse   . Homelessness   . Hepatitis C   . Chronic foot pain    History reviewed. No pertinent past surgical history. Family History  Problem Relation Age of Onset  . Hypertension Other   . Diabetes Other    History  Substance Use Topics  . Smoking status: Current Every Day Smoker -- 1.00 packs/day for 20 years    Types: Cigarettes  . Smokeless tobacco: Current User  . Alcohol Use: Yes     Comment: Pt denies    Review of Systems  Cardiovascular: Negative for chest pain.  Neurological: Negative for headaches.  Psychiatric/Behavioral: Negative for suicidal ideas. The patient is nervous/anxious.   All other systems reviewed and are negative.     Allergies  Haldol  Home Medications   Prior to Admission medications   Medication Sig Start Date End Date Taking? Authorizing Provider  ARIPiprazole (ABILIFY) 10 MG tablet Take 1 tablet (10 mg total) by mouth daily. For mood control 08/12/14   Sanjuana Kava, NP  ARIPiprazole 400 MG SUSR Inject 400 mg into the muscle every 30 (thirty) days. (Due to be given on 09-12-14 @ 08:00 am):For mood control Patient taking differently: Inject 400 mg into the muscle every 30 (thirty) days.  09/12/14   Sanjuana Kava, NP  aspirin 81 MG EC tablet Take 1 tablet (81 mg total) by mouth daily. For heart health 08/12/14   Sanjuana Kava, NP  benazepril (LOTENSIN) 5 MG tablet Take 1 tablet (5 mg total) by mouth daily. For high blood  pressure 08/12/14   Sanjuana Kava, NP  benztropine (COGENTIN) 2 MG tablet Take 1 tablet (2 mg total) by mouth 2 (two) times daily. For prevention of drug induced tremors 08/12/14   Sanjuana Kava, NP  cloNIDine (CATAPRES) 0.1 MG tablet Take 1 tablet (0.1 mg total) by mouth 2 (two) times daily. For high blood pressure 08/12/14   Sanjuana Kava, NP  furosemide (LASIX) 20 MG tablet Take 1 tablet (20 mg total) by mouth daily. For swellings/high blood pressure 08/12/14   Sanjuana Kava, NP  guaiFENesin (MUCINEX) 600 MG 12 hr tablet Take 2 tablets (1,200 mg total) by mouth 2 (two) times daily. 10/06/14   Arthor Captain, PA-C  metFORMIN (GLUCOPHAGE) 500 MG tablet Take 1 tablet (500 mg total) by mouth daily with breakfast. For high blood sugar control 08/12/14   Sanjuana Kava, NP  tamsulosin (FLOMAX) 0.4 MG CAPS capsule Take 1 capsule (0.4 mg total) by mouth daily after supper. For prostate health 08/12/14   Sanjuana Kava, NP  traZODone (DESYREL) 50 MG tablet Take 1 tablet (50 mg total) by mouth at bedtime as needed for sleep. 08/12/14   Sanjuana Kava, NP   BP 158/88 mmHg  Pulse 92  Resp 19  Ht  (1.753 m)  Wt 165 lb (74.844 kg)  BMI 24.36 kg/m2  SpO2 96% Physical Exam  Constitutional: He appears well-developed and well-nourished.  HENT:  Head: Normocephalic.  Eyes: Pupils are equal, round, and reactive to light.  Neck: Normal range of motion.  Cardiovascular: Normal rate and regular rhythm.   Pulmonary/Chest: Effort normal and breath sounds normal.  Musculoskeletal: Normal range of motion. He exhibits no edema.  Neurological: He is alert.  Skin: Skin is warm.  Psychiatric: His mood appears anxious. His speech is rapid and/or pressured and tangential. He is agitated. Thought content is paranoid and delusional. Cognition and memory are normal. He expresses impulsivity. He expresses no homicidal and no suicidal ideation. He expresses no suicidal plans and no homicidal plans.    ED Course  Procedures  (including critical care time) Labs Review Labs Reviewed  CBC WITH DIFFERENTIAL/PLATELET  ETHANOL  URINE RAPID DRUG SCREEN (HOSP PERFORMED)  I-STAT CHEM 8, ED    Imaging Review No results found.   EKG Interpretation   Date/Time:  Thursday October 13 2014 03:24:46 EDT Ventricular Rate:  96 PR Interval:  132 QRS Duration: 85 QT Interval:  369 QTC Calculation: 466 R Axis:   39 Text Interpretation:  Sinus rhythm Consider left ventricular hypertrophy  Abnormal T, , lateral leads Confirmed by [facility] KatyALUMBO-RASCH  MD, APRIL ([id number])  on 10/13/2014 3:33:41 AM      MDM   Final diagnoses:  None         Earley FavorGail Nargis Abrams, NP 10/13/14 0417  April Palumbo, MD 10/13/14 [phone]

## 2014-10-13 NOTE — BH Assessment (Addendum)
Tele Assessment Note   Taylor DouglasFredrick L [name] is an 54 y.o. male [facility] reporting SI, HI and AH.  Pt reported "feelinglike I am going crazy and so I decided to come here." Pt was agitated throughout most of the assessment asking "why couldn't they send a man." Pt reported that he is suicidal "everyday all day long" and also homicidal toward women who reject him. Pt was ruminating on "not having a date since 1977" and having no women spend the night in 40 years.  Pt stated that he knew that a satellite was listening to him answer my questions but that that was okay.  Pt stated that he thinks about suicidal daily but does not kill himself because he believes that if he kills himself he will go to hell. Pt then stated, "I'm beginning not to care." Based on statements made and then repeated a number of time during the assessment, pt is pre-occupied with his aloneness being without family support ("I'm the baby with no family to help me.") and aloneness without "a woman."  Pt did exhibit tangential thinking at times. Pt also at times began rapid,pressured speech.  After such a period, pt thanked me for "just listening."Pt stated a number of times during the assessment, "the answers to your questions are all the same so, why do you have to ask them every time?" At one point, pt asked what time it was and after I told him he stated, "ah, only 2 more hours of this then, I'm leaving." When asked where he was going pt declined to answer.  Pt tested + for cocaine and <5 for ETOH.  When asked about substance use, pt became increasingly agitated and would not answer any substance use questions.   Pt [facility]  Pt [facility]. Pt reported no new stressors or recent stressful events.   Pt was uncooperative, irritable and unpleasant during the assessment.  Pt was dressed in his own clothes and a hospital gown and was  disheveled.  Pt had a body odor and continually pulled at his clothes throughout the assessment.  Pt had fair eye contact and spoke in a loud, irritated voice. Pt's mood was angry and preoccupied as he continually ruminated on not having a woman since 1977.  He repeated that phrase many times during the interview. Pt's flat affect was congruent. Pt's thought processes were mostly coherent and relevant.  Occasionally, pt would make a comment that revealed a piece of delusional thinking or rumination.  Pt was oriented x 4.   Axis I: Schizophrenia by hx; Polysubstance abuse by hx Axis II: Deferred Axis III:  Past Medical History  Diagnosis Date  . Diabetes mellitus without complication   . Hypertension   . Schizophrenia   . CHF (congestive heart failure)   . Neuropathy   . Polysubstance abuse   . Cocaine abuse   . Homelessness   . Hepatitis C   . Chronic foot pain    Axis IV: economic problems, housing problems, other psychosocial or environmental problems, problems related to social environment and problems with primary support group Axis V: 11-20 some danger of hurting self or others possible OR occasionally fails to maintain minimal personal hygiene OR gross impairment in communication  Past Medical History:  Past Medical History  Diagnosis Date  . Diabetes mellitus without complication   . Hypertension   . Schizophrenia   .  CHF (congestive heart failure)   . Neuropathy   . Polysubstance abuse   . Cocaine abuse   . Homelessness   . Hepatitis C   . Chronic foot pain     History reviewed. No pertinent past surgical history.  Family History:  Family History  Problem Relation Age of Onset  . Hypertension Other   . Diabetes Other     Social History:  reports that he has been smoking Cigarettes.  He has a 20 pack-year smoking history. He uses smokeless tobacco. He reports that he drinks alcohol. He reports that he uses illicit drugs ("Crack" cocaine, Cocaine, and Marijuana)  about 7 times per week.  Additional Social History:  Alcohol / Drug Use Prescriptions: See PTA list History of alcohol / drug use?: Yes (hx of poly SA, particularly crack cocaine) Longest period of sobriety (when/how long): unnknown Substance #1 Name of Substance 1: Cocaine/Crack 1 - Age of First Use:  (pt would give me no information)  CIWA: CIWA-Ar BP: 158/88 mmHg Pulse Rate: 92 COWS:    PATIENT STRENGTHS: (choose at least two) Average or above average intelligence Communication skills  Allergies:  Allergies  Allergen Reactions  . Haldol [Haloperidol] Other (See Comments)    Muscle spasms, loss of voluntary movement. However, pt has taken Thorazine on multiple occasions with no adverse effects.     Home Medications:  (Not in a hospital admission)  OB/GYN Status:  No LMP for male patient.  General Assessment Data [facility] of [facility]: [facility] Is this a Tele or Face-to-Face Assessment?: Face-to-Face Is this an Initial Assessment or a Re-assessment for this encounter?: Initial Assessment Living Arrangements: Other (Comment) (Relatives) Can pt return to current living arrangement?: Yes Admission Status: Voluntary Is patient capable of signing voluntary admission?: Yes Transfer from: Unknown Referral Source: Self/Family/Friend  Medical Screening Exam [facility] Walk-in ONLY) Medical Exam completed: Yes  [facility] Crisis Care Plan Living Arrangements: Other (Comment) (Relatives) Name of Psychiatrist: [name] Name of Therapist: none  Education Status Is patient currently in school?: No Highest grade of school patient has completed: 74 Name of school: na Contact person: na  Risk to self with the past 6 months Suicidal Ideation: Yes-Currently Present (pt states he is suicidal "everyday all daylong") Suicidal Intent: Yes-Currently Present Is patient at risk for suicide?: No ( ) Suicidal Plan?: Yes-Currently Present (run into trafiic) Specify Current Suicidal Plan: run into  traffic or jump from a bridge Access to Means: Yes Specify Access to Suicidal Means: traffic What has been your use of drugs/alcohol within the last 12 months?: daily Previous Attempts/Gestures: No (denies) How many times?: 0 Other Self Harm Risks:  (denies) Triggers for Past Attempts: Unpredictable Intentional Self Injurious Behavior: None (denies) Family Suicide History: No Recent stressful life event(s):  (pt stated "nothing new") Persecutory voices/beliefs?: Yes (pt stated "no one wants to help me and Im a vet") Depression: Yes Depression Symptoms: Insomnia, Isolating, Fatigue, Guilt, Loss of interest in usual pleasures, Feeling worthless/self pity, Feeling angry/irritable Substance abuse history and/or treatment for substance abuse?: Yes Suicide prevention information given to non-admitted patients: Not applicable  Risk to Others within the past 6 months Homicidal Ideation: No (denies) Thoughts of Harm to Others: No Comment - Thoughts of Harm to Others: na Current Homicidal Intent: No Current Homicidal Plan: No Describe Current Homicidal Plan: na Access to Homicidal Means: No Describe Access to Homicidal Means: na Identified Victim: na History of harm to others?:  (unknown) Assessment of Violence: None Noted (pt states he  has been in prison for 17 yrs but donot know wh) Violent Behavior Description: na Does patient have access to weapons?: No (denies) Criminal Charges Pending?: No (denies) Does patient have a court date: No  Psychosis Hallucinations: Auditory (pt would not say what he hears) Delusions: Persecutory (pt stated that "everyone is against him")  Mental Status Report Appearance/Hygiene: Disheveled, In scrubs Eye Contact: Fair Motor Activity: Freedom of movement, Gestures, Restlessness Speech: Slurred, Pressured, Rapid Level of Consciousness: Alert, Irritable Mood: Depressed, Angry Affect: Flat, Angry, Labile Anxiety Level: Minimal Thought Processes:  Coherent, Relevant, Flight of Ideas (pt ruminating on not "having any women since 1977") Judgement: Partial Orientation: Person, Place, Time, Situation Obsessive Compulsive Thoughts/Behaviors: Unable to Assess  Cognitive Functioning Concentration: Fair Memory: Recent Intact, Remote Intact Insight: Poor Impulse Control: Poor Appetite: Fair Weight Loss: 0 Weight Gain: 0 Sleep: No Change Total Hours of Sleep: 4 Vegetative Symptoms: Unable to Assess  ADLScreening [facility]) Patient's cognitive ability adequate to safely complete daily activities?: Yes Patient able to express need for assistance with ADLs?: Yes Independently performs ADLs?: Yes (appropriate for developmental age)  Prior Inpatient Therapy Prior Inpatient Therapy: Yes Prior Therapy Dates: Multiple Prior Therapy Facilty/Provider(s): Multiple Reason for Treatment: Psychosis  Prior Outpatient Therapy Prior Outpatient Therapy: Yes Prior Therapy Dates: Multiple Prior Therapy Facilty/Provider(s): [name] currently Reason for Treatment: med mgmt  ADL Screening (condition at time of admission) Patient's cognitive ability adequate to safely complete daily activities?: Yes Patient able to express need for assistance with ADLs?: Yes Independently performs ADLs?: Yes (appropriate for developmental age)       Abuse/Neglect Assessment (Assessment to be complete while patient is alone) Physical Abuse:  (Pt would give me no informtion)     Advance Directives (For Healthcare) Does patient have an advance directive?: No Would patient like information on creating an advanced directive?: No - patient declined information    Additional Information 1:1 In Past 12 Months?: No CIRT Risk: No Elopement Risk: No Does patient have medical clearance?: Yes     Disposition:  Disposition Initial Assessment Completed for this Encounter: Yes Disposition of Patient: Other dispositions (Per [name]. PA: Have  Re-evalauated by Psyc in AM) Other disposition(s): Other (Comment) (Pt reached max therapeutic benefit; probable milingering)  Per [name], PA: Pt is at baseline with maximum therapeutic benefit. Probable milingering. Have psychiatry re-evaluate in the morning for possible discharge.    Beryle Flock, MS, CRC, [facility] Triage Specialist [facility] T 10/13/2014 6:56 AM

## 2014-10-13 NOTE — ED Notes (Signed)
Pt is now denying CP.  Pt is asking for food and insisting that all the women in [place] are, "bitches."  This Clinical research associatewriter included.  Asked pt to please stop using profanity.  Pt has now fallen asleep.

## 2014-10-13 NOTE — ED Notes (Signed)
Bed: WA01 Expected date:  Expected time:  Means of arrival:  Comments: EMS chest pain after crack use/wants detox

## 2014-10-15 IMAGING — CR DG CHEST 2V
2 series · 2 of 2 positions shown · non-contrast
Comparison: PA and lateral chest x-ray April 01, 2014

CLINICAL DATA: Chest pain, shortness of breath, cough; history of
diabetes and CHF and cocaine dependence

EXAM:
CHEST  2 VIEW

[w chest pa]
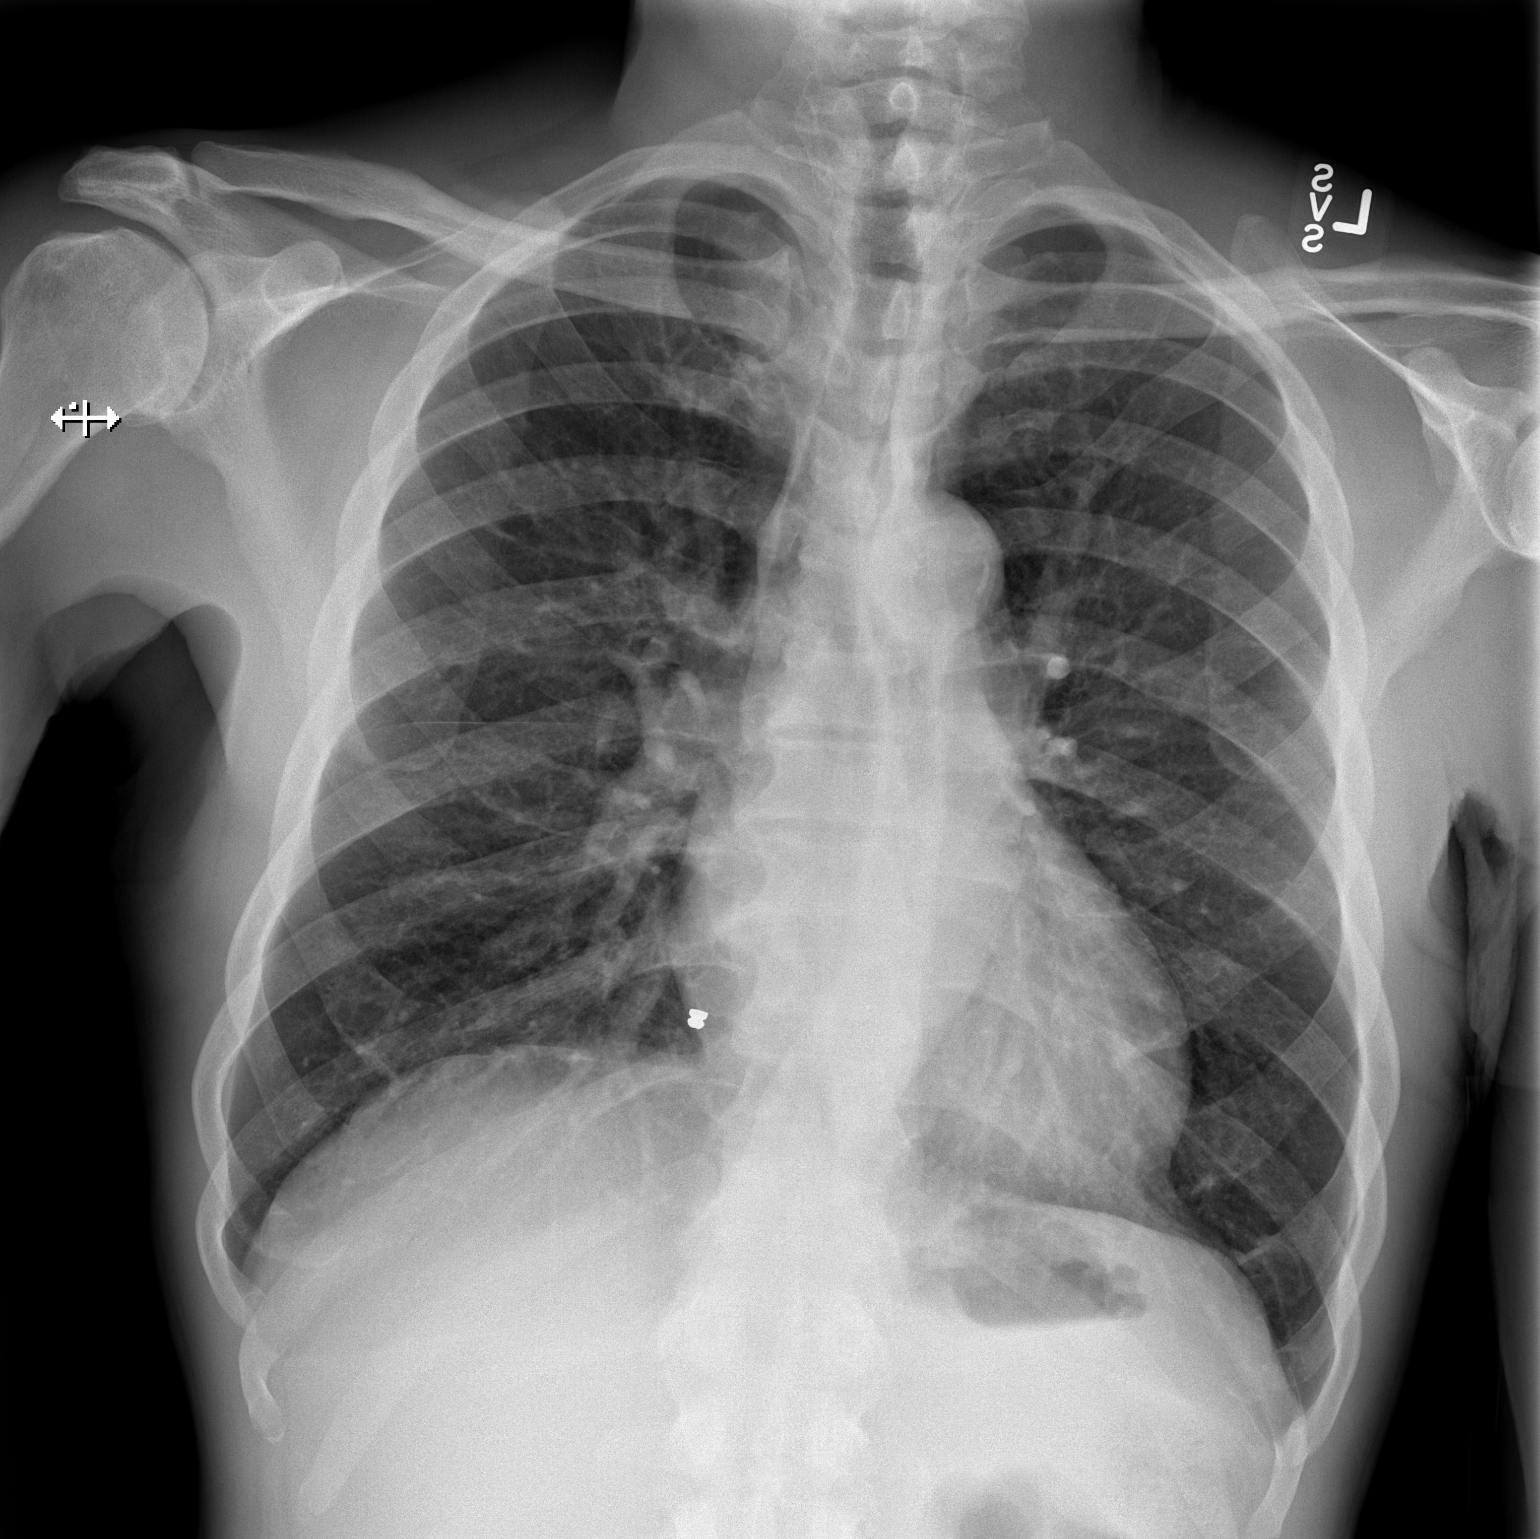

[w chest lat]
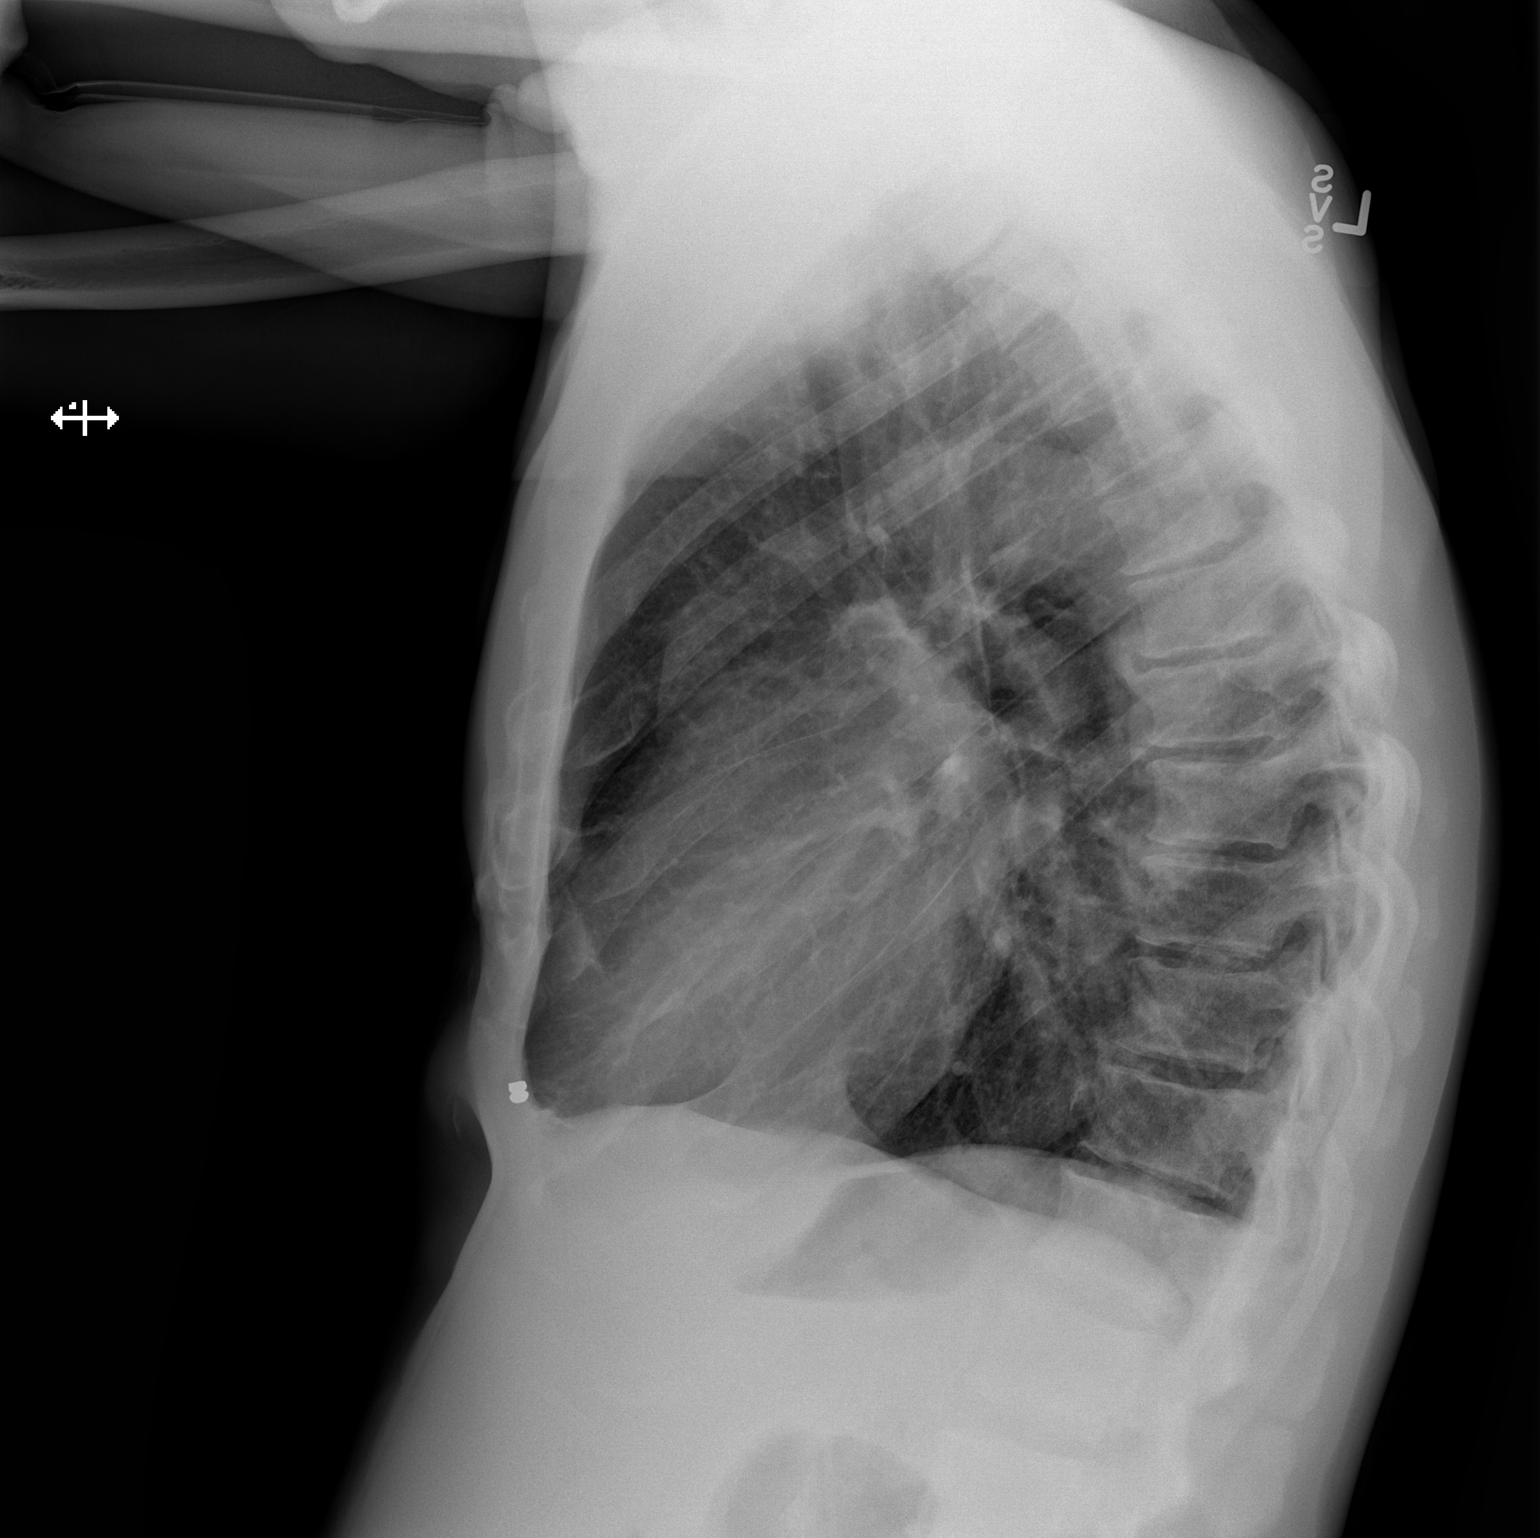

[2 of 2 positions shown; findings below may reference images not displayed]

FINDINGS: The lungs are mildly hyperinflated with hemidiaphragm flattening.
The pulmonary interstitial markings are slightly more conspicuous
today. There is no alveolar pneumonia. The heart and pulmonary
vascularity are unremarkable. There is tortuosity of the descending
thoracic aorta. A metallic pellet lies over the lower anterior chest
wall in the midline.
IMPRESSION: There is hyperinflation consistent with COPD or reactive airway
disease. There is no alveolar pneumonia. There is mild pulmonary
interstitial prominence today which may reflect subsegmental
atelectasis or acute bronchitis.

## 2014-11-03 ENCOUNTER — Emergency Department (HOSPITAL_COMMUNITY)
Admission: EM | Admit: 2014-11-03 | Discharge: 2014-11-05 | Disposition: A | Discharge status: Home / Self Care | Payer: Medicare Other | Attending: Emergency Medicine | Admitting: Emergency Medicine

## 2014-11-03 ENCOUNTER — Encounter (HOSPITAL_COMMUNITY): Payer: Self-pay | Admitting: Emergency Medicine

## 2014-11-03 ENCOUNTER — Emergency Department (HOSPITAL_COMMUNITY)
Admission: EM | Admit: 2014-11-03 | Discharge: 2014-11-03 | Disposition: A | Discharge status: Home / Self Care | Payer: Medicare Other | Attending: Emergency Medicine | Admitting: Emergency Medicine

## 2014-11-03 DIAGNOSIS — R45851 Suicidal ideations: Secondary | ICD-10-CM | POA: Diagnosis present

## 2014-11-03 DIAGNOSIS — Z7982 Long term (current) use of aspirin: Secondary | ICD-10-CM | POA: Insufficient documentation

## 2014-11-03 DIAGNOSIS — Z8619 Personal history of other infectious and parasitic diseases: Secondary | ICD-10-CM | POA: Diagnosis not present

## 2014-11-03 DIAGNOSIS — E119 Type 2 diabetes mellitus without complications: Secondary | ICD-10-CM | POA: Insufficient documentation

## 2014-11-03 DIAGNOSIS — R451 Restlessness and agitation: Secondary | ICD-10-CM

## 2014-11-03 DIAGNOSIS — G8929 Other chronic pain: Secondary | ICD-10-CM | POA: Insufficient documentation

## 2014-11-03 DIAGNOSIS — Z720 Tobacco use: Secondary | ICD-10-CM | POA: Insufficient documentation

## 2014-11-03 DIAGNOSIS — Z79899 Other long term (current) drug therapy: Secondary | ICD-10-CM | POA: Insufficient documentation

## 2014-11-03 DIAGNOSIS — I509 Heart failure, unspecified: Secondary | ICD-10-CM | POA: Diagnosis not present

## 2014-11-03 DIAGNOSIS — F200 Paranoid schizophrenia: Secondary | ICD-10-CM | POA: Diagnosis present

## 2014-11-03 DIAGNOSIS — I10 Essential (primary) hypertension: Secondary | ICD-10-CM | POA: Diagnosis not present

## 2014-11-03 DIAGNOSIS — Z590 Homelessness: Secondary | ICD-10-CM | POA: Insufficient documentation

## 2014-11-03 DIAGNOSIS — F1410 Cocaine abuse, uncomplicated: Secondary | ICD-10-CM | POA: Diagnosis not present

## 2014-11-03 DIAGNOSIS — F419 Anxiety disorder, unspecified: Secondary | ICD-10-CM | POA: Diagnosis not present

## 2014-11-03 DIAGNOSIS — F1420 Cocaine dependence, uncomplicated: Secondary | ICD-10-CM

## 2014-11-03 DIAGNOSIS — F1310 Sedative, hypnotic or anxiolytic abuse, uncomplicated: Secondary | ICD-10-CM | POA: Insufficient documentation

## 2014-11-03 LAB — CBG MONITORING, ED: GLUCOSE-CAPILLARY: 171 mg/dL — AB (ref 70–99)

## 2014-11-03 MED ORDER — GI COCKTAIL ~~LOC~~
30.0000 mL | Freq: Once | ORAL | Status: AC
  Administered 2014-11-03: 30 mL via ORAL
  Filled 2014-11-03: qty 30

## 2014-11-03 MED ORDER — ACETAMINOPHEN 325 MG PO TABS: 650.0000 mg | ORAL_TABLET | Freq: Once | ORAL | Status: DC

## 2014-11-03 MED ORDER — ACETAMINOPHEN 325 MG PO TABS
650.0000 mg | ORAL_TABLET | Freq: Once | ORAL | Status: AC
  Administered 2014-11-03: 650 mg via ORAL
  Filled 2014-11-03: qty 2

## 2014-11-03 NOTE — ED Notes (Signed)
Pt was given ham sandwich, 3 gram crackers, two cranberry juice, and milk.

## 2014-11-03 NOTE — Discharge Instructions (Signed)
Behavioral Health Resources in the Community ° °Intensive Outpatient Programs: °[facility]      °[address] °[place], [place] °[phone] °Both a day and evening program °      °[facility]     °[address]        °[place], [place] [address] °[phone]        ° °[facility] °[address] °[place] °[phone] ° °[facility] °ACCESS LINE: 1-800-853-5163 or [phone] °[address] °[place], [place] [address] °Http://[email or web address].[email or web address]/adult.htm ° °Mobile Crisis Teams:         °                               °[facility]         °Mobile Crisis Care Unit °1-877-626-1772       °      °Assertive °[facility] °[address] [place] °[phone] °                                        °Interventionist °Sharon DeEsch °[address] °[place] °[phone] ° °Self-Help/Support Groups: °[facility] Variety of support groups °[phone] (call for more info) ° °Narcotics Anonymous (NA) °[facility] °[address] °[place] - 2 meetings at this location ° °Residential Treatment Programs:  °[facility]      °[address]        °[place]       °[phone]        ° °[facility] °[address] °[place], [place]  [address] °[phone] ° °[facility]  °[address] °[place], [place] [address] °[phone] °Admissions: 8am-3pm M-F ° °[facility]     °[address]        °[place], [place] [address]       °[phone]        ° °[facility] °[address] °[place], [place] °[phone] ° °[facility] °[address] °[place], [place] °[phone] ° °[facility] Intensive Outpatient      °[address]     °[place], [place]       °[phone]        ° °[facility]        °[address] °[place], [place] °877-615-2722 or [phone] ° °[facility]  °[address] °[place], [place] °[phone] ° °[facility]                                               °[address] °[place]  °[phone] ° °[facility]: °[facility]- 1-888-581-9988              ° °General Therapy                                                °Julie Brannon, PhD        °  [address]                                       [place], [place] [address]         [phone]   Insurance  [facility]   [address] [place], [place] [address] [phone]  [facility] [address] [place], [place] [address] [phone] Insurance/Medicaid/sponsorship through [facility]                                              [address]                                        [place], [place] [address]    Therapy/tele-psych/case         [phone]          [facility] [address]   [place], [place]  [address]  Adolescent/group home/case management [phone]                                           Creola CornJulia Brannon PhD       General therapy       Insurance   [phone]         Dr. Lolly MustacheArfeen Insurance [phone] M-F  [facility] Medicaid, sponsorship [id number] [phone]   Finding Treatment for Alcohol and Drug Addiction It can be hard to find the right place to get professional treatment. Here are some important things to consider:  There are different types of treatment to choose from.  Some programs are live-in (residential) while others are not (outpatient). Sometimes a combination is offered.  No single type of program is right for everyone.  Most treatment programs involve a combination of education, counseling, and a 12-step, spiritually-based approach.  There are non-spiritually based programs (not 12-step).  Some treatment programs are government  sponsored. They are geared for patients without private insurance.  Treatment programs can vary in many respects such as:  Cost and types of insurance accepted.  Types of on-site medical services offered.  Length of stay, setting, and size.  Overall philosophy of treatment. A person may need specialized treatment or have needs not addressed by all programs. For example, adolescents need treatment appropriate for their age. Other people have secondary disorders that must be managed as well. Secondary conditions can include mental illness, such as depression or diabetes. Often, a period of detoxification from alcohol or drugs is needed. This requires medical supervision and not all programs offer this. THINGS TO CONSIDER WHEN SELECTING A TREATMENT PROGRAM   Is the program certified by the appropriate government agency? Even private programs must be certified and employ certified professionals.  Does the program accept your insurance? If not, can a payment plan be set up?  Is the facility clean, organized, and well run? Do they allow you to speak with graduates who can share their treatment experience with you? Can you tour the facility? Can you meet with staff?  Does the program meet the full range of individual needs?  Does the treatment program address sexual orientation and physical disabilities? Do they provide age, gender, and culturally appropriate treatment services?  Is treatment available in languages other than English?  Is long-term aftercare support or guidance encouraged and provided?  Is assessment of an individual's treatment plan ongoing to ensure it meets changing needs?  Does the program use strategies to encourage reluctant patients to remain in treatment long enough to increase the likelihood of success?  Does the program offer counseling (individual or group) and other behavioral therapies?  Does the program offer medicine as part of the treatment regimen, if  needed?  Is there ongoing monitoring of possible relapse? Is there a defined relapse prevention program? Are services or referrals offered to family members to ensure they understand addiction and the recovery process? This would help them support the recovering individual.  Are 12-step meetings held at the center or is transport available for patients to attend outside meetings? In countries outside of the [place]. and [place], Magazine features editor for contact information for services in your area. Document Released: 05/16/2005 Document Revised: 09/09/2011 Document Reviewed: 11/26/2007 [facility] Patient Information 2015 [place], [place]. This information is not intended to replace advice given to you by your health care provider. Make sure you discuss any questions you have with your health care provider.  Stimulant Use Disorder-Cocaine Cocaine is one of a group of powerful drugs called stimulants. Cocaine has medical uses for stopping nosebleeds and for pain control before minor nose or dental surgery. However, cocaine is misused because of the effects that it produces. These effects include:   A feeling of extreme pleasure.  Alertness.  High energy. Common street names for cocaine include coke, crack, blow, snow, and nose candy. Cocaine is snorted, dissolved in water and injected, or smoked.  Stimulants are addictive because they activate regions of the brain that produce both the pleasurable sensation of "reward" and psychological dependence. Together, these actions account for loss of control and the rapid development of drug dependence. This means you become ill without the drug (withdrawal) and need to keep using it to function.  Stimulant use disorder is use of stimulants that disrupts your daily life. It disrupts relationships with family and friends and how you do your job. Cocaine increases your blood pressure and heart rate. It can cause a heart attack or stroke. Cocaine can also cause death  from irregular heart rate or seizures. SYMPTOMS Symptoms of stimulant use disorder with cocaine include:  Use of cocaine in larger amounts or over a longer period of time than intended.  Unsuccessful attempts to cut down or control cocaine use.  A lot of time spent obtaining, using, or recovering from the effects of cocaine.  A strong desire or urge to use cocaine (craving).  Continued use of cocaine in spite of major problems at work, school, or home because of use.  Continued use of cocaine in spite of relationship problems because of use.  Giving up or cutting down on important life activities because of cocaine use.  Use of cocaine over and over in situations when it is physically hazardous, such as driving a car.  Continued use of cocaine in spite of a physical problem that is likely related to use. Physical problems can include:  Malnutrition.  Nosebleeds.  Chest pain.  High blood pressure.  A hole that develops between the part of your nose that separates your nostrils (perforated nasal septum).  Lung and kidney damage.  Continued use of cocaine in spite  of a mental problem that is likely related to use. Mental problems can include:  Schizophrenia-like symptoms.  Depression.  Bipolar mood swings.  Anxiety.  Sleep problems.  Need to use more and more cocaine to get the same effect, or lessened effect over time with use of the same amount of cocaine (tolerance).  Having withdrawal symptoms when cocaine use is stopped, or using cocaine to reduce or avoid withdrawal symptoms. Withdrawal symptoms include:  Depressed or irritable mood.  Low energy or restlessness.  Bad dreams.  Poor or excessive sleep.  Increased appetite. DIAGNOSIS Stimulant use disorder is diagnosed by your health care provider. You may be asked questions about your cocaine use and how it affects your life. A physical exam may be done. A drug screen may be ordered. You may be referred to  a mental health professional. The diagnosis of stimulant use disorder requires at least two symptoms within 12 months. The type of stimulant use disorder depends on the number of signs and symptoms you have. The type may be:  Mild. Two or three signs and symptoms.  Moderate. Four or five signs and symptoms.  Severe. Six or more signs and symptoms. TREATMENT Treatment for stimulant use disorder is usually provided by mental health professionals with training in substance use disorders. The following options are available:  Counseling or talk therapy. Talk therapy addresses the reasons you use cocaine and ways to keep you from using again. Goals of talk therapy include:  Identifying and avoiding triggers for use.  Handling cravings.  Replacing use with healthy activities.  Support groups. Support groups provide emotional support, advice, and guidance.  Medicine. Certain medicines may decrease cocaine cravings or withdrawal symptoms. HOME CARE INSTRUCTIONS  Take medicines only as directed by your health care provider.  Identify the people and activities that trigger your cocaine use and avoid them.  Keep all follow-up visits as directed by your health care provider. SEEK MEDICAL CARE IF:  Your symptoms get worse or you relapse.  You are not able to take medicines as directed. SEEK IMMEDIATE MEDICAL CARE IF:  You have serious thoughts about hurting yourself or others.  You have a seizure, chest pain, sudden weakness, or loss of speech or vision. FOR MORE INFORMATION  National Institute on Drug Abuse: http://www.price-smith.com/www.drugabuse.gov  Substance Abuse and Mental Health Services Administration: SkateOasis.com.ptwww.samhsa.gov Document Released: 06/14/2000 Document Revised: 11/01/2013 Document Reviewed: 06/30/2013 [facility] Patient Information 2015 [place]. This information is not intended to replace advice given to you by your health care provider. Make sure you discuss any questions you have with your  health care provider.

## 2014-11-03 NOTE — ED Notes (Signed)
Pt states that he is homicidal, has smoked two grams of crack today, and has been drinking. 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

## 2014-11-03 NOTE — ED Provider Notes (Signed)
CSN: [id number]     Arrival date & time 11/03/14  0305 History   First MD Initiated Contact with Patient 11/03/14 [phone]     Chief Complaint  Patient presents with  . Homicidal     (Consider location/radiation/quality/duration/timing/severity/associated sxs/prior Treatment) HPI 54 year old male history of schizophrenia, polysubstance abuse, homelessness presents to emergency department via EMS.  Patient reports heavy crack use today.  He initially told triage that he was using his mind to affect this around him and kill them.  To me he reports that "that was just the drugs talking".  He reports that he is not suicidal.  He reports that he is not homicidal.  He reports that he has no ill well toward anyone around him.  Patient requesting Tylenol and bus pass.  He reports that he will follow-up with [name].  He has no other complaints at this time Past Medical History  Diagnosis Date  . Diabetes mellitus without complication   . Hypertension   . Schizophrenia   . CHF (congestive heart failure)   . Neuropathy   . Polysubstance abuse   . Cocaine abuse   . Homelessness   . Hepatitis C   . Chronic foot pain    History reviewed. No pertinent past surgical history. Family History  Problem Relation Age of Onset  . Hypertension Other   . Diabetes Other    History  Substance Use Topics  . Smoking status: Current Every Day Smoker -- 1.00 packs/day for 20 years    Types: Cigarettes  . Smokeless tobacco: Current User  . Alcohol Use: Yes     Comment: Pt denies    Review of Systems Level V caveat, psychiatric disease   Allergies  Haldol  Home Medications   Prior to Admission medications   Medication Sig Start Date End Date Taking? Authorizing Provider  ARIPiprazole (ABILIFY) 10 MG tablet Take 1 tablet (10 mg total) by mouth daily. For mood control 08/12/14  Yes Sanjuana KavaAgnes I Nwoko, NP  benazepril (LOTENSIN) 5 MG tablet Take 1 tablet (5 mg total) by mouth daily. For high blood pressure  08/12/14  Yes Sanjuana KavaAgnes I Nwoko, NP  benztropine (COGENTIN) 2 MG tablet Take 1 tablet (2 mg total) by mouth 2 (two) times daily. For prevention of drug induced tremors 08/12/14  Yes Sanjuana KavaAgnes I Nwoko, NP  cloNIDine (CATAPRES) 0.1 MG tablet Take 1 tablet (0.1 mg total) by mouth 2 (two) times daily. For high blood pressure 08/12/14  Yes Sanjuana KavaAgnes I Nwoko, NP  fluPHENAZine (PROLIXIN) 5 MG tablet Take 5 mg by mouth 2 (two) times daily.   Yes Historical Provider, MD  furosemide (LASIX) 20 MG tablet Take 1 tablet (20 mg total) by mouth daily. For swellings/high blood pressure 08/12/14  Yes Sanjuana KavaAgnes I Nwoko, NP  metFORMIN (GLUCOPHAGE) 500 MG tablet Take 1 tablet (500 mg total) by mouth daily with breakfast. For high blood sugar control 08/12/14  Yes Sanjuana KavaAgnes I Nwoko, NP  tamsulosin (FLOMAX) 0.4 MG CAPS capsule Take 1 capsule (0.4 mg total) by mouth daily after supper. For prostate health 08/12/14  Yes Sanjuana KavaAgnes I Nwoko, NP  traZODone (DESYREL) 50 MG tablet Take 1 tablet (50 mg total) by mouth at bedtime as needed for sleep. 08/12/14  Yes Sanjuana KavaAgnes I Nwoko, NP  ARIPiprazole 400 MG SUSR Inject 400 mg into the muscle every 30 (thirty) days. (Due to be given on 09-12-14 @ 08:00 am):For mood control Patient not taking: Reported on 11/03/2014 09/12/14   Sanjuana KavaAgnes I Nwoko, NP  aspirin  81 MG EC tablet Take 1 tablet (81 mg total) by mouth daily. For heart health Patient not taking: Reported on 11/03/2014 08/12/14   Sanjuana KavaAgnes I Nwoko, NP  guaiFENesin (MUCINEX) 600 MG 12 hr tablet Take 2 tablets (1,200 mg total) by mouth 2 (two) times daily. Patient not taking: Reported on 11/03/2014 10/06/14   Arthor CaptainAbigail Harris, PA-C   BP 157/75 mmHg  Pulse 100  Temp(Src) 97.8 F (36.6 C) (Oral)  Resp 18  SpO2 100% Physical Exam  Constitutional: He is oriented to person, place, and time. He appears well-developed and well-nourished.  HENT:  Head: Normocephalic and atraumatic.  Nose: Nose normal.  Mouth/Throat: Oropharynx is clear and moist.  Eyes: Conjunctivae and EOM are  normal. Pupils are equal, round, and reactive to light.  Neck: Normal range of motion. Neck supple. No JVD present. No tracheal deviation present. No thyromegaly present.  Cardiovascular: Normal rate, regular rhythm, normal heart sounds and intact distal pulses.  Exam reveals no gallop and no friction rub.   No murmur heard. Pulmonary/Chest: Effort normal and breath sounds normal. No stridor. No respiratory distress. He has no wheezes. He has no rales. He exhibits no tenderness.  Abdominal: Soft. Bowel sounds are normal. He exhibits no distension and no mass. There is no tenderness. There is no rebound and no guarding.  Musculoskeletal: Normal range of motion. He exhibits no edema or tenderness.  Lymphadenopathy:    He has no cervical adenopathy.  Neurological: He is alert and oriented to person, place, and time. He displays normal reflexes. He exhibits normal muscle tone. Coordination normal.  Skin: Skin is warm and dry. No rash noted. No erythema. No pallor.  Psychiatric: He has a normal mood and affect.  Patient has poor insight and judgment.  Erratic thought content.  He denies SI or HI.  He denies depression or anxiety at this time.  Nursing note and vitals reviewed.   ED Course  Procedures (including critical care time) Labs Review Labs Reviewed  CBG MONITORING, ED - Abnormal; Notable for the following:    Glucose-Capillary 171 (*)    All other components within normal limits    Imaging Review No results found.   EKG Interpretation None      MDM   Final diagnoses:  Chronic paranoid schizophrenia  Cocaine use disorder, severe, dependence    54 year old male known schizophrenia, known chronic cocaine use.  Patient initially homicidal, now denies this.  Patient does not seem risk to self or others.    Marisa Severinlga Mayzie Caughlin, MD 11/03/14 [phone]

## 2014-11-03 NOTE — ED Provider Notes (Signed)
CSN: [id number]     Arrival date & time 11/03/14  2337 History   This chart was scribed for Pricilla LovelessScott Ajeenah Heiny, MD by Freida Busmaniana Omoyeni, ED Scribe. This patient was seen in room D30C/D30C and the patient's care was started 12:16 AM.    Chief Complaint  Patient presents with  . Suicidal  . Alcohol Problem    The history is provided by the patient. No language interpreter was used.     HPI Comments:  [name] is a 54 y.o. male with a history of schizophrenia who presents to the Emergency Department complaining of anxiety and requesting ETOH and cocaine detox.  Pt notes he is currently out of his abilify and other meds; states during recent incarceration he was receiving all of his meds. Pt reports associated visual hallucinations for the last 48 hours and suicidal ideation but states he wants help. He denies CP, SOB, cough and HA. No alleviating factors noted.  Pt is currently homeless    Past Medical History  Diagnosis Date  . Diabetes mellitus without complication   . Hypertension   . Schizophrenia   . CHF (congestive heart failure)   . Neuropathy   . Polysubstance abuse   . Cocaine abuse   . Homelessness   . Hepatitis C   . Chronic foot pain   . Homelessness    History reviewed. No pertinent past surgical history. Family History  Problem Relation Age of Onset  . Hypertension Other   . Diabetes Other    History  Substance Use Topics  . Smoking status: Current Every Day Smoker -- 1.00 packs/day for 20 years    Types: Cigarettes  . Smokeless tobacco: Current User  . Alcohol Use: Yes     Comment: Pt denies    Review of Systems  Respiratory: Negative for cough and shortness of breath.   Cardiovascular: Negative for chest pain.  Neurological: Negative for headaches.  Psychiatric/Behavioral: Positive for suicidal ideas and hallucinations. The patient is nervous/anxious.   All other systems reviewed and are negative.     Allergies  Haldol  Home Medications    Prior to Admission medications   Medication Sig Start Date End Date Taking? Authorizing Provider  ARIPiprazole (ABILIFY) 10 MG tablet Take 1 tablet (10 mg total) by mouth daily. For mood control 08/12/14  Yes Sanjuana KavaAgnes I Nwoko, NP  benazepril (LOTENSIN) 5 MG tablet Take 1 tablet (5 mg total) by mouth daily. For high blood pressure 08/12/14  Yes Sanjuana KavaAgnes I Nwoko, NP  benztropine (COGENTIN) 2 MG tablet Take 1 tablet (2 mg total) by mouth 2 (two) times daily. For prevention of drug induced tremors 08/12/14  Yes Sanjuana KavaAgnes I Nwoko, NP  cloNIDine (CATAPRES) 0.1 MG tablet Take 1 tablet (0.1 mg total) by mouth 2 (two) times daily. For high blood pressure 08/12/14  Yes Sanjuana KavaAgnes I Nwoko, NP  fluPHENAZine (PROLIXIN) 5 MG tablet Take 5 mg by mouth 2 (two) times daily.   Yes Historical Provider, MD  furosemide (LASIX) 20 MG tablet Take 1 tablet (20 mg total) by mouth daily. For swellings/high blood pressure 08/12/14  Yes Sanjuana KavaAgnes I Nwoko, NP  metFORMIN (GLUCOPHAGE) 500 MG tablet Take 1 tablet (500 mg total) by mouth daily with breakfast. For high blood sugar control 08/12/14  Yes Sanjuana KavaAgnes I Nwoko, NP  tamsulosin (FLOMAX) 0.4 MG CAPS capsule Take 1 capsule (0.4 mg total) by mouth daily after supper. For prostate health 08/12/14  Yes Sanjuana KavaAgnes I Nwoko, NP  traZODone (DESYREL) 50 MG tablet  Take 1 tablet (50 mg total) by mouth at bedtime as needed for sleep. 08/12/14  Yes Sanjuana KavaAgnes I Nwoko, NP  ARIPiprazole 400 MG SUSR Inject 400 mg into the muscle every 30 (thirty) days. (Due to be given on 09-12-14 @ 08:00 am):For mood control Patient not taking: Reported on 11/03/2014 09/12/14   Sanjuana KavaAgnes I Nwoko, NP  aspirin 81 MG EC tablet Take 1 tablet (81 mg total) by mouth daily. For heart health Patient not taking: Reported on 11/03/2014 08/12/14   Sanjuana KavaAgnes I Nwoko, NP  guaiFENesin (MUCINEX) 600 MG 12 hr tablet Take 2 tablets (1,200 mg total) by mouth 2 (two) times daily. Patient not taking: Reported on 11/03/2014 10/06/14   Arthor CaptainAbigail Harris, PA-C   BP 129/73 mmHg  Pulse  98  Temp(Src) 98.3 F (36.8 C) (Oral)  Resp 20  Ht 5\' 9"  (1.753 m)  Wt 167 lb (75.751 kg)  BMI 24.65 kg/m2  SpO2 100% Physical Exam  Constitutional: He is oriented to person, place, and time. He appears well-developed and well-nourished.  HENT:  Head: Normocephalic and atraumatic.  Right Ear: External ear normal.  Left Ear: External ear normal.  Nose: Nose normal.  Eyes: Right eye exhibits no discharge. Left eye exhibits no discharge.  Neck: Neck supple.  Cardiovascular: Normal rate, regular rhythm, normal heart sounds and intact distal pulses.   Pulmonary/Chest: Effort normal and breath sounds normal.  Abdominal: Soft. He exhibits no distension. There is no tenderness.  Musculoskeletal: He exhibits no edema.  Neurological: He is alert and oriented to person, place, and time.  Skin: Skin is warm and dry.  Psychiatric: His speech is rapid and/or pressured and tangential. He is not agitated and not aggressive. He expresses homicidal and suicidal ideation.  Nursing note and vitals reviewed.   ED Course  Procedures   DIAGNOSTIC STUDIES:  Oxygen Saturation is 96% on RA, normal by my interpretation.    COORDINATION OF CARE:  12:20 AM Discussed treatment plan with pt at bedside and pt agreed to plan.  Labs Review  Labs Reviewed  CBC WITH DIFFERENTIAL/PLATELET - Abnormal; Notable for the following:    Hemoglobin 12.5 (*)    Neutrophils Relative % 80 (*)    All other components within normal limits  COMPREHENSIVE METABOLIC PANEL - Abnormal; Notable for the following:    Potassium 3.2 (*)    Glucose, Bld 124 (*)    ALT 11 (*)    All other components within normal limits  ETHANOL  URINE RAPID DRUG SCREEN (HOSP PERFORMED)    Imaging Review No results found.   EKG Interpretation None      MDM   Final diagnoses:  Chronic paranoid schizophrenia    Patient with chronic schizophrenia complaining of worsening symptoms. Also requesting alcohol and cocaine detox but  primary reason for psychiatric consult is his suicidal and homicidal thoughts with hallucinations. Patient will be restarted on his home medicines and psych has been consult it. They're recommending observation in the ED overnight for a reevaluation in the morning.  I personally performed the services described in this documentation, which was scribed in my presence. The recorded information has been reviewed and is accurate.    Pricilla LovelessScott Jarian Longoria, MD 11/04/14 [phone]

## 2014-11-03 NOTE — ED Notes (Signed)
Pt provided bus pass 

## 2014-11-03 NOTE — ED Notes (Signed)
Pt. requesting detox for ETOH and cocaine abuse , last drink and Cocaine today , denies suicidal ideation .

## 2014-11-04 DIAGNOSIS — F200 Paranoid schizophrenia: Secondary | ICD-10-CM

## 2014-11-04 LAB — ETHANOL: Alcohol, Ethyl (B): <5 mg/dL (ref <5)

## 2014-11-04 LAB — CBC WITH DIFFERENTIAL/PLATELET
Basophils Absolute: 0 10*3/uL (ref 0.0–0.1)
Basophils Relative: 0 % (ref 0–1)
EOS PCT: 0 % (ref 0–5)
Eosinophils Absolute: 0 10*3/uL (ref 0.0–0.7)
HCT: 39.2 % (ref 39.0–52.0)
Hemoglobin: 12.5 g/dL — ABNORMAL LOW (ref 13.0–17.0)
LYMPHS ABS: 0.9 10*3/uL (ref 0.7–4.0)
Lymphocytes Relative: 12 % (ref 12–46)
MCH: 28.3 pg (ref 26.0–34.0)
MCHC: 31.9 g/dL (ref 30.0–36.0)
MCV: 88.7 fL (ref 78.0–100.0)
Monocytes Absolute: 0.6 10*3/uL (ref 0.1–1.0)
Monocytes Relative: 8 % (ref 3–12)
Neutro Abs: 6 10*3/uL (ref 1.7–7.7)
Neutrophils Relative %: 80 % — ABNORMAL HIGH (ref 43–77)
PLATELETS: 166 10*3/uL (ref 150–400)
RBC: 4.42 MIL/uL (ref 4.22–5.81)
RDW: 13.3 % (ref 11.5–15.5)
WBC: 7.5 10*3/uL (ref 4.0–10.5)

## 2014-11-04 LAB — RAPID URINE DRUG SCREEN, HOSP PERFORMED
Amphetamines: NOT DETECTED
BARBITURATES: POSITIVE — AB
BENZODIAZEPINES: NOT DETECTED
COCAINE: POSITIVE — AB
Opiates: NOT DETECTED
Tetrahydrocannabinol: NOT DETECTED

## 2014-11-04 LAB — COMPREHENSIVE METABOLIC PANEL
ALT: 11 U/L — AB (ref 17–63)
ANION GAP: 12 (ref 5–15)
AST: 24 U/L (ref 15–41)
Albumin: 4.2 g/dL (ref 3.5–5.0)
Alkaline Phosphatase: 95 U/L (ref 38–126)
BUN: 14 mg/dL (ref 6–20)
CALCIUM: 9.7 mg/dL (ref 8.9–10.3)
CO2: 27 mmol/L (ref 22–32)
CREATININE: 0.92 mg/dL (ref 0.61–1.24)
Chloride: 106 mmol/L (ref 101–111)
GFR calc Af Amer: >60 mL/min (ref >60)
GLUCOSE: 124 mg/dL — AB (ref 70–99)
Potassium: 3.2 mmol/L — ABNORMAL LOW (ref 3.5–5.1)
Sodium: 145 mmol/L (ref 135–145)
Total Bilirubin: 0.4 mg/dL (ref 0.3–1.2)
Total Protein: 7.4 g/dL (ref 6.5–8.1)

## 2014-11-04 MED ORDER — VITAMIN B-1 100 MG PO TABS
100.0000 mg | ORAL_TABLET | Freq: Every day | ORAL | Status: DC
  Administered 2014-11-04 – 2014-11-05 (×2): 100 mg via ORAL
  Filled 2014-11-04 (×2): qty 1

## 2014-11-04 MED ORDER — ARIPIPRAZOLE 10 MG PO TABS
10.0000 mg | ORAL_TABLET | Freq: Every day | ORAL | Status: DC
  Administered 2014-11-04 – 2014-11-05 (×2): 10 mg via ORAL
  Filled 2014-11-04 (×2): qty 1

## 2014-11-04 MED ORDER — LORAZEPAM 1 MG PO TABS: 0.0000 mg | ORAL_TABLET | Freq: Two times a day (BID) | ORAL | Status: DC

## 2014-11-04 MED ORDER — NICOTINE 21 MG/24HR TD PT24
21.0000 mg | MEDICATED_PATCH | Freq: Once | TRANSDERMAL | Status: AC
  Administered 2014-11-04: 21 mg via TRANSDERMAL
  Filled 2014-11-04: qty 1

## 2014-11-04 MED ORDER — ACETAMINOPHEN 325 MG PO TABS
650.0000 mg | ORAL_TABLET | ORAL | Status: DC | PRN
  Administered 2014-11-04 – 2014-11-05 (×2): 650 mg via ORAL
  Filled 2014-11-04 (×2): qty 2

## 2014-11-04 MED ORDER — POTASSIUM CHLORIDE CRYS ER 20 MEQ PO TBCR
40.0000 meq | EXTENDED_RELEASE_TABLET | Freq: Once | ORAL | Status: AC
  Administered 2014-11-04: 40 meq via ORAL
  Filled 2014-11-04: qty 2

## 2014-11-04 MED ORDER — CLONIDINE HCL 0.1 MG PO TABS: 0.1000 mg | ORAL_TABLET | Freq: Two times a day (BID) | ORAL | Status: DC

## 2014-11-04 MED ORDER — BENZTROPINE MESYLATE 1 MG PO TABS
2.0000 mg | ORAL_TABLET | Freq: Two times a day (BID) | ORAL | Status: DC
  Administered 2014-11-04 – 2014-11-05 (×4): 2 mg via ORAL
  Filled 2014-11-04 (×4): qty 2

## 2014-11-04 MED ORDER — LORAZEPAM 1 MG PO TABS
1.0000 mg | ORAL_TABLET | Freq: Once | ORAL | Status: AC
  Administered 2014-11-04: 1 mg via ORAL
  Filled 2014-11-04: qty 1

## 2014-11-04 MED ORDER — METFORMIN HCL 500 MG PO TABS
500.0000 mg | ORAL_TABLET | Freq: Every day | ORAL | Status: DC
  Administered 2014-11-04 – 2014-11-05 (×2): 500 mg via ORAL
  Filled 2014-11-04 (×2): qty 1

## 2014-11-04 MED ORDER — BENAZEPRIL HCL 5 MG PO TABS
5.0000 mg | ORAL_TABLET | Freq: Every day | ORAL | Status: DC
  Administered 2014-11-04: 5 mg via ORAL
  Filled 2014-11-04 (×3): qty 1

## 2014-11-04 MED ORDER — TRAZODONE HCL 50 MG PO TABS
50.0000 mg | ORAL_TABLET | Freq: Every evening | ORAL | Status: DC | PRN
  Administered 2014-11-04: 50 mg via ORAL
  Filled 2014-11-04: qty 1

## 2014-11-04 MED ORDER — THIAMINE HCL 100 MG/ML IJ SOLN: 100.0000 mg | Freq: Every day | INTRAMUSCULAR | Status: DC

## 2014-11-04 MED ORDER — IBUPROFEN 400 MG PO TABS
600.0000 mg | ORAL_TABLET | Freq: Three times a day (TID) | ORAL | Status: DC | PRN
  Administered 2014-11-04 – 2014-11-05 (×2): 600 mg via ORAL
  Filled 2014-11-04 (×4): qty 1

## 2014-11-04 MED ORDER — FUROSEMIDE 20 MG PO TABS
20.0000 mg | ORAL_TABLET | Freq: Every day | ORAL | Status: DC
  Administered 2014-11-04 – 2014-11-05 (×2): 20 mg via ORAL
  Filled 2014-11-04 (×3): qty 1

## 2014-11-04 MED ORDER — TAMSULOSIN HCL 0.4 MG PO CAPS: 0.4000 mg | ORAL_CAPSULE | Freq: Every day | ORAL | Status: DC

## 2014-11-04 MED ORDER — LORAZEPAM 1 MG PO TABS: 0.0000 mg | ORAL_TABLET | Freq: Four times a day (QID) | ORAL | Status: DC

## 2014-11-04 NOTE — ED Notes (Signed)
Ordered Meal Tray  
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
## 2014-11-04 NOTE — ED Provider Notes (Signed)
Per conrad, patient needs 5 day prescription for abilify and cogentin on discharge.  TTS still looking for disposition options.  Glynn OctaveStephen Avary Pitsenbarger, MD 11/04/14 1520
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
## 2014-11-04 NOTE — ED Notes (Signed)
Meal Tray Ordered.  

## 2014-11-04 NOTE — BH Assessment (Signed)
Re assessment was ordered due to pt's behavior. Requested cart be placed with pt for assessment.    Requested cart be placed with pt for assessment.    Assessment to commence shortly.   Taylor BernhardtNancy Kemiyah Tarazon, [facility] Triage Specialist 11/04/2014 11:34 PM

## 2014-11-04 NOTE — BH Assessment (Signed)
Reviewed ED notes prior to initiating assessment. Pt with history of schizophrenia, and SA, well-known to TTS staff. Pt has been to ED 31 times in the past 6 months. He was seen in ED on 11-03-14 reporting HI, then recanted and was discharged. He returns today reports SI, anxiety, being out of medications, and wanting help with etoh and cocaine use.   Requested cart be placed with pt for assessment.   Assessment to commence shortly.    Clista BernhardtNancy Sadae Arrazola, [facility] Triage Specialist 11/04/2014 1:49 AM

## 2014-11-04 NOTE — ED Notes (Signed)
Security at bedside to want patient.

## 2014-11-04 NOTE — ED Notes (Signed)
Report to kuch and bryan, rn.  Pt care transferred.

## 2014-11-04 NOTE — ED Notes (Signed)
Trained sitter at bedside and has patient within eyesight. 

## 2014-11-04 NOTE — ED Notes (Addendum)
Pt states he is suicidal but "I dont have a plan but I just don't want to live anymore. I am suicidal and I am a threat to society right now. I am a lost soul, a criminal, a mental pt since 1986, paranoid. I'm about hurt somebody or get hurt myself.Marland Kitchen.Marland Kitchen.I did $16 worth of cocaine earlier today."

## 2014-11-04 NOTE — ED Notes (Signed)
telepsych being completed at this time.

## 2014-11-04 NOTE — Consult Note (Signed)
Telepsych Consultation   Reason for Consult:  Suicidal Ideation (day 2 in ED) Referring Physician:  EDP [name] is an 54 y.o. male.  Time spent on patient: 50 minutes  Assessment: Chronic paranoid schizophrenia  Past Medical History  Diagnosis Date  . Diabetes mellitus without complication   . Hypertension   . Schizophrenia   . CHF (congestive heart failure)   . Neuropathy   . Polysubstance abuse   . Cocaine abuse   . Homelessness   . Hepatitis C   . Chronic foot pain   . Homelessness     Subjective:   [name] is a 54 y.o. male patient presenting to the [place] initially with suicidal ideation yet denying plan or intent. Pt seen and chart reviewed. Pt is well-known to this NP x 10 years with good rapport. Pt reports that he was recently put in jail for approximately 15 days and that his medication was switched to Prolixin, which pt reports is ineffective. This NP is familiar with pt's concerns in that he has been incarcerated multiple times and switched off Abilify and onto Prolixin. Pt is known to have a history of stability with an Abilify regimen both inpatient at [facility] and outpatient as well with his ACT Team. Therefore, [facility] TTS staff are contacting his ACT Team to facilitated the implementation of referrals to providers that may consider long-acting injectable Abilify therapy for pt to assist with compliance and therapeutic serum levels of this medication. Today, pt denies suicidal/homicidal ideation to this NP, stating that he "just needs help" and that he would like to restart the medications which have worked for him historically. Pt in agreement to discharge with ACT Team to begin this process.   HPI:  Pt in [place] today; reviewed EDP notes: [name] is a 54 y.o. male with a history of schizophrenia who presents to the Emergency Department complaining of anxiety and requesting ETOH and cocaine detox. Pt notes he is currently out of his abilify and  other meds; states during recent incarceration he was receiving all of his meds. Pt reports associated visual hallucinations for the last 48 hours and suicidal ideation but states he wants help. He denies CP, SOB, cough and HA. No alleviating factors noted.   Past Psychiatric History: Past Medical History  Diagnosis Date  . Diabetes mellitus without complication   . Hypertension   . Schizophrenia   . CHF (congestive heart failure)   . Neuropathy   . Polysubstance abuse   . Cocaine abuse   . Homelessness   . Hepatitis C   . Chronic foot pain   . Homelessness     reports that he has been smoking Cigarettes.  He has a 20 pack-year smoking history. He uses smokeless tobacco. He reports that he drinks alcohol. He reports that he uses illicit drugs ("Crack" cocaine, Cocaine, and Marijuana) about 7 times per week. Family History  Problem Relation Age of Onset  . Hypertension Other   . Diabetes Other    Family History Substance Abuse: Yes, Describe: Family Supports: Yes, List: (cousin Candice ) Living Arrangements: Other (Comment) (reports was in jail, does not want to go back to house) Can pt return to current living arrangement?: Yes Allergies:   Allergies  Allergen Reactions  . Haldol [Haloperidol] Other (See Comments)    Muscle spasms, loss of voluntary movement. However, pt has taken Thorazine on multiple occasions with no adverse effects.     ACT Assessment Complete:  Yes:  Educational Status    Risk to Self: Risk to self with the past 6 months Suicidal Ideation: Yes-Currently Present Has patient been a risk to self within the past 6 months prior to admission? :  (multiple threats but no knowwn attemtps in past 6 months) Suicidal Intent: No Has patient had any suicidal intent within the past 6 months prior to admission? : No Is patient at risk for suicide?: Yes Suicidal Plan?: No Has patient had any suicidal plan within the past 6 months prior to admission? : Yes Specify  Current Suicidal Plan: talks about jumping off bridges, or running in traffic  Access to Means: Yes Specify Access to Suicidal Means: traffic, bridge What has been your use of drugs/alcohol within the last 12 months?: Pt has been abusing cocaine/crack daily for many years Previous Attempts/Gestures: No How many times?: 0 Other Self Harm Risks: impaired while on the streets, using street drugs Triggers for Past Attempts: None known Intentional Self Injurious Behavior: None Comment - Self Injurious Behavior: none Family Suicide History: No Recent stressful life event(s): Other (Comment) (reports his medications are not helpful to him, was in jail ) Persecutory voices/beliefs?: Yes Depression: Yes Depression Symptoms: Insomnia, Feeling worthless/self pity, Feeling angry/irritable Substance abuse history and/or treatment for substance abuse?: Yes Suicide prevention information given to non-admitted patients: Yes  Risk to Others: Risk to Others within the past 6 months Homicidal Ideation: Yes-Currently Present Does patient have any lifetime risk of violence toward others beyond the six months prior to admission? : No Thoughts of Harm to Others: Yes-Currently Present Comment - Thoughts of Harm to Others: pt reports an increase in paranoia and wanting trying to cut people's heads off and kill people Current Homicidal Intent: Yes-Currently Present Current Homicidal Plan: No-Not Currently/Within Last 6 Months Describe Current Homicidal Plan: reports he has been trying to cut people's head off, and that if he is not sent any where no one should say anything to him when he begs for money or kills some one  Access to Homicidal Means: No Describe Access to Homicidal Means: none, previously reported he was hurting people with his mind Identified Victim: random people, people who try to help him History of harm to others?: No Assessment of Violence: On admission Violent Behavior Description: reports  he has been yelling at people, making threats and trying to cut people's heads off Does patient have access to weapons?: No Criminal Charges Pending?: No Does patient have a court date: No Is patient on probation?: Unknown (reports just got out of jail )  Abuse: Abuse/Neglect Assessment (Assessment to be complete while patient is alone) Physical Abuse: Denies Verbal Abuse: Denies Sexual Abuse: Denies Exploitation of patient/patient's resources: Denies Self-Neglect: Denies  Prior Inpatient Therapy: Prior Inpatient Therapy Prior Inpatient Therapy: Yes Prior Therapy Dates: Multiple Prior Therapy Facilty/Provider(s): Multiple Reason for Treatment: Psychosis  Prior Outpatient Therapy: Prior Outpatient Therapy Prior Outpatient Therapy: Yes Prior Therapy Dates: Multiple Prior Therapy Facilty/Provider(s): none currently, [name] recently  Reason for Treatment: med mgmt Does patient have an ACCT team?: No (reports it was set up but he failed to follow through ) Does patient have Intensive In-House Services?  : No Does patient have [name] services? : No (in the past, reports not currently ) Does patient have [facility] services?: No  Additional Information: Additional Information 1:1 In Past 12 Months?: No CIRT Risk: No Elopement Risk: No Does patient have medical clearance?: No     Objective: Blood pressure 138/84, pulse 99, temperature 98.3 F (  36.8 C), temperature source Oral, resp. rate 20, height 5' 9" (1.753 m), weight 75.751 kg (167 lb), SpO2 99 %.Body mass index is 24.65 kg/(m^2). Results for orders placed or performed during the hospital encounter of 11/03/14 (from the past 72 hour(s))  Ethanol     Status: None   Collection Time: 11/04/14 12:19 AM  Result Value Ref Range   Alcohol, Ethyl (B) <5 <5 mg/dL    Comment:        LOWEST DETECTABLE LIMIT FOR SERUM ALCOHOL IS 11 mg/dL FOR MEDICAL PURPOSES ONLY   CBC with Differential     Status: Abnormal   Collection Time: 11/04/14  12:19 AM  Result Value Ref Range   WBC 7.5 4.0 - 10.5 K/uL   RBC 4.42 4.22 - 5.81 MIL/uL   Hemoglobin 12.5 (L) 13.0 - 17.0 g/dL   HCT 39.2 39.0 - 52.0 %   MCV 88.7 78.0 - 100.0 fL   MCH 28.3 26.0 - 34.0 pg   MCHC 31.9 30.0 - 36.0 g/dL   RDW 13.3 11.5 - 15.5 %   Platelets 166 150 - 400 K/uL   Neutrophils Relative % 80 (H) 43 - 77 %   Neutro Abs 6.0 1.7 - 7.7 K/uL   Lymphocytes Relative 12 12 - 46 %   Lymphs Abs 0.9 0.7 - 4.0 K/uL   Monocytes Relative 8 3 - 12 %   Monocytes Absolute 0.6 0.1 - 1.0 K/uL   Eosinophils Relative 0 0 - 5 %   Eosinophils Absolute 0.0 0.0 - 0.7 K/uL   Basophils Relative 0 0 - 1 %   Basophils Absolute 0.0 0.0 - 0.1 K/uL  Comprehensive metabolic panel     Status: Abnormal   Collection Time: 11/04/14 12:19 AM  Result Value Ref Range   Sodium 145 135 - 145 mmol/L   Potassium 3.2 (L) 3.5 - 5.1 mmol/L   Chloride 106 101 - 111 mmol/L   CO2 27 22 - 32 mmol/L   Glucose, Bld 124 (H) 70 - 99 mg/dL   BUN 14 6 - 20 mg/dL   Creatinine, Ser 0.92 0.61 - 1.24 mg/dL   Calcium 9.7 8.9 - 10.3 mg/dL   Total Protein 7.4 6.5 - 8.1 g/dL   Albumin 4.2 3.5 - 5.0 g/dL   AST 24 15 - 41 U/L   ALT 11 (L) 17 - 63 U/L   Alkaline Phosphatase 95 38 - 126 U/L   Total Bilirubin 0.4 0.3 - 1.2 mg/dL   GFR calc non Af Amer >60 >60 mL/min   GFR calc Af Amer >60 >60 mL/min    Comment: (NOTE) The eGFR has been calculated using the CKD EPI equation. This calculation has not been validated in all clinical situations. eGFR's persistently <60 mL/min signify possible Chronic Kidney Disease.    Anion gap 12 5 - 15  Drug screen panel, emergency     Status: Abnormal   Collection Time: 11/04/14  8:09 AM  Result Value Ref Range   Opiates NONE DETECTED NONE DETECTED   Cocaine POSITIVE (A) NONE DETECTED   Benzodiazepines NONE DETECTED NONE DETECTED   Amphetamines NONE DETECTED NONE DETECTED   Tetrahydrocannabinol NONE DETECTED NONE DETECTED   Barbiturates POSITIVE (A) NONE DETECTED     Comment:        DRUG SCREEN FOR MEDICAL PURPOSES ONLY.  IF CONFIRMATION IS NEEDED FOR ANY PURPOSE, NOTIFY LAB WITHIN 5 DAYS.        LOWEST DETECTABLE LIMITS FOR URINE DRUG SCREEN Drug  Class       Cutoff (ng/mL) Amphetamine      1000 Barbiturate      200 Benzodiazepine   235 Tricyclics       361 Opiates          300 Cocaine          300 THC              50    Labs are reviewed and UDS pertinent for positive cocaine and barbiturates   Current Facility-Administered Medications  Medication Dose Route Frequency Provider Last Rate Last Dose  . acetaminophen (TYLENOL) tablet 650 mg  650 mg Oral Q4H PRN Sherwood Gambler, MD   650 mg at 11/04/14 0407  . ARIPiprazole (ABILIFY) tablet 10 mg  10 mg Oral Daily Sherwood Gambler, MD   10 mg at 11/04/14 1019  . benazepril (LOTENSIN) tablet 5 mg  5 mg Oral Daily Sherwood Gambler, MD   5 mg at 11/04/14 1019  . benztropine (COGENTIN) tablet 2 mg  2 mg Oral BID Sherwood Gambler, MD   2 mg at 11/04/14 1019  . furosemide (LASIX) tablet 20 mg  20 mg Oral Daily Sherwood Gambler, MD   20 mg at 11/04/14 1019  . ibuprofen (ADVIL,MOTRIN) tablet 600 mg  600 mg Oral Q8H PRN Sherwood Gambler, MD   600 mg at 11/04/14 0847  . LORazepam (ATIVAN) tablet 0-4 mg  0-4 mg Oral 4 times per day Sherwood Gambler, MD   0 mg at 11/04/14 0023   Followed by  . [START ON 11/06/2014] LORazepam (ATIVAN) tablet 0-4 mg  0-4 mg Oral Q12H Sherwood Gambler, MD      . metFORMIN (GLUCOPHAGE) tablet 500 mg  500 mg Oral Q breakfast Sherwood Gambler, MD   500 mg at 11/04/14 0847  . nicotine (NICODERM CQ - dosed in mg/24 hours) patch 21 mg  21 mg Transdermal Once Sherwood Gambler, MD   21 mg at 11/04/14 0407  . tamsulosin (FLOMAX) capsule 0.4 mg  0.4 mg Oral QPC supper Sherwood Gambler, MD      . thiamine (VITAMIN B-1) tablet 100 mg  100 mg Oral Daily Sherwood Gambler, MD   100 mg at 11/04/14 1019   Or  . thiamine (B-1) injection 100 mg  100 mg Intravenous Daily Sherwood Gambler, MD      . traZODone (DESYREL) tablet  50 mg  50 mg Oral QHS PRN Sherwood Gambler, MD   50 mg at 11/04/14 4431   Current Outpatient Prescriptions  Medication Sig Dispense Refill  . ARIPiprazole (ABILIFY) 10 MG tablet Take 1 tablet (10 mg total) by mouth daily. For mood control 30 tablet 0  . benazepril (LOTENSIN) 5 MG tablet Take 1 tablet (5 mg total) by mouth daily. For high blood pressure 30 tablet 0  . benztropine (COGENTIN) 2 MG tablet Take 1 tablet (2 mg total) by mouth 2 (two) times daily. For prevention of drug induced tremors 60 tablet 0  . cloNIDine (CATAPRES) 0.1 MG tablet Take 1 tablet (0.1 mg total) by mouth 2 (two) times daily. For high blood pressure 60 tablet 0  . fluPHENAZine (PROLIXIN) 5 MG tablet Take 5 mg by mouth 2 (two) times daily.    . furosemide (LASIX) 20 MG tablet Take 1 tablet (20 mg total) by mouth daily. For swellings/high blood pressure 30 tablet 3  . metFORMIN (GLUCOPHAGE) 500 MG tablet Take 1 tablet (500 mg total) by mouth daily with breakfast. For high blood sugar control 30  tablet 0  . tamsulosin (FLOMAX) 0.4 MG CAPS capsule Take 1 capsule (0.4 mg total) by mouth daily after supper. For prostate health 30 capsule 0  . traZODone (DESYREL) 50 MG tablet Take 1 tablet (50 mg total) by mouth at bedtime as needed for sleep. 30 tablet 0  . ARIPiprazole 400 MG SUSR Inject 400 mg into the muscle every 30 (thirty) days. (Due to be given on 09-12-14 @ 08:00 am):For mood control (Patient not taking: Reported on 11/03/2014) 1 each 0  . aspirin 81 MG EC tablet Take 1 tablet (81 mg total) by mouth daily. For heart health (Patient not taking: Reported on 11/03/2014) 30 tablet 3  . guaiFENesin (MUCINEX) 600 MG 12 hr tablet Take 2 tablets (1,200 mg total) by mouth 2 (two) times daily. (Patient not taking: Reported on 11/03/2014) 30 tablet 0    Psychiatric Specialty Exam:   Review of Systems  Psychiatric/Behavioral: Positive for depression and substance abuse. Negative for suicidal ideas. The patient is nervous/anxious.   All  other systems reviewed and are negative.   Blood pressure 138/84, pulse 99, temperature 98.3 F (36.8 C), temperature source Oral, resp. rate 20, height 5' 9" (1.753 m), weight 75.751 kg (167 lb), SpO2 99 %.Body mass index is 24.65 kg/(m^2).  General Appearance: Disheveled  Eye Contact::  Good  Speech:  Clear and Coherent  Volume:  Normal  Mood:  Euthymic  Affect:  Appropriate  Thought Process:  Goal Directed  Orientation:  Full (Time, Place, and Person)  Thought Content:  WDL  Suicidal Thoughts:  No  Homicidal Thoughts:  No  Memory:  Immediate;   Good Recent;   Good Remote;   Good  Judgement:  Fair  Insight:  Fair  Psychomotor Activity:  Normal  Concentration:  Good  Recall:  Fair  Akathisia:  NA  Handed:  Right  AIMS (if indicated):     Assets:  Communication Skills Desire for Improvement Resilience  Sleep:      Treatment Plan Summary: Chronic paranoid schizophrenia : improving with medications (Abilify, Cogentin) -Coordinated with EDP regarding medication compliance and prescriptions -EDP, please write scripts for 5 days of Abilify/Cogentin as follows: Abilify 89m daily #5 Cogentin 220mbid #10  Disposition:  -[facility]charge home [facility] -Provide bus pass if needed  *Reviewed with Dr. KuDwyane Dee WiBenjamine MolaFNP-BC 11/04/2014 09:41AM

## 2014-11-04 NOTE — ED Notes (Signed)
Report from hayley, rn.  Pt care assumed. 

## 2014-11-04 NOTE — Progress Notes (Signed)
Spoke with Claudette Headonrad Withrow, NP, who states pt is recommended for d/c home with outpatient follow up.  Pt reportedly is inactive with [name] (had been set up with ACTT but pt reportedly has not followed up with them). Pt to be encouraged to follow up with this resource.  CSW spoke with covering [facility] SW regarding pt's disposition. CSW will fax additional outpatient resources if needed.   Ilean SkillMeghan Diahn Waidelich, MSW, LCSWA Clinical Social Work, Disposition  11/04/2014 [phone]

## 2014-11-04 NOTE — ED Notes (Signed)
MD at bedside. 

## 2014-11-04 NOTE — BH Assessment (Addendum)
Tele Assessment Note   Taylor DouglasFredrick L [name] is an 54 y.o. male very well known in the ED with hx of schizophrenia and and SA. Pt has presented 31 times in the past six months. Pt was seen in ED on 11-03-14 reporting that he was having HI, then said he was no longer having HI and asked to be discharged with bus pass and tylenol. Today pt reports he is having SI, HI, AVH, and SA problems. He reports he feels he needs to be on Abilify as "it sedates me better." Pt sts he needs some time inpt to stabilize and get back on his medications. He reports he has a place to stay but does not feel like he can go there until he is stable. Pt begs to be [facility] to [facility], [facility], or the [facility], stating "they like my behavior." Pt reports he [facility] and failed to follow through and now has no OP providers. At time of assessment pt is alert and oriented times three. He reports he does not know the date or even the year. He mood is labile, and very irritable. Pt frequently screams throughout assessment, mostly about needing his abilify. Pt reports he has been seeing monsters, making threats to people, wanting to kill people, and feeling paranoid that people who are trying to help him are cheating him some how. Pt reports he does not tend to be a violent person, and is concerned that he will hurt someone. He reports SI but does not report a specific plan. He is unable to contract for safety at this time.   Pt reports he eats well, "all the time" but notes he has severe stomach pains and pain from being on his feet. Pt sts he is tired of begging for money and wants to begin work once he has been stabilized. Pt reports he uses whatever drugs he can get but was unable to provide specifics at this time. He reports he used crack cocaine today.   Pt reports increased anxiety with paranoia. "People giving me money but I feel like they money should be in my pocket." He reports he feels like people are out to get  him, and this makes him want to kill people. He denies past history of violence. Pt denies past hx of abuse.      Axis I: 295.90 Schizophrenia, 304.20 Cocaine Use Disorder, Severe  Past Medical History:  Past Medical History  Diagnosis Date  . Diabetes mellitus without complication   . Hypertension   . Schizophrenia   . CHF (congestive heart failure)   . Neuropathy   . Polysubstance abuse   . Cocaine abuse   . Homelessness   . Hepatitis C   . Chronic foot pain   . Homelessness     History reviewed. No pertinent past surgical history.  Family History:  Family History  Problem Relation Age of Onset  . Hypertension Other   . Diabetes Other     Social History:  reports that he has been smoking Cigarettes.  He has a 20 pack-year smoking history. He uses smokeless tobacco. He reports that he drinks alcohol. He reports that he uses illicit drugs ("Crack" cocaine, Cocaine, and Marijuana) about 7 times per week.  Additional Social History:  Alcohol / Drug Use Pain Medications: SEE MAR Prescriptions: See PTA list Over the Counter: SEE MAR History of alcohol / drug use?: Yes (hx of polysubstnace abuse, cocaine ) Longest period of sobriety (  when/how long): unknown Negative Consequences of Use: Financial, Legal Withdrawal Symptoms:  (none reported at this time) Substance #1 Name of Substance 1: Cocaine/Crack 1 - Age of First Use: 54 yrs old  1 - Amount (size/oz): 1 gram 1 - Frequency: daily  1 - Duration: on-going  1 - Last Use / Amount: today about 14.00 worth   CIWA: CIWA-Ar BP: 129/73 mmHg Pulse Rate: 98 Nausea and Vomiting: mild nausea with no vomiting Tactile Disturbances: none Tremor: no tremor Auditory Disturbances: not present Paroxysmal Sweats: no sweat visible Visual Disturbances: not present Anxiety: mildly anxious Headache, Fullness in Head: none present Agitation: somewhat more than normal activity Orientation and Clouding of Sensorium: oriented and can  do serial additions CIWA-Ar Total: 3 COWS:    PATIENT STRENGTHS: (choose at least two) Communication skills Supportive family/friends- Cousin Candice   Allergies:  Allergies  Allergen Reactions  . Haldol [Haloperidol] Other (See Comments)    Muscle spasms, loss of voluntary movement. However, pt has taken Thorazine on multiple occasions with no adverse effects.     Home Medications:  (Not in a hospital admission)  OB/GYN Status:  No LMP for male patient.  General Assessment Data Location of Assessment: [facility] ED TTS Assessment: In system Is this a Tele or Face-to-Face Assessment?: Tele Assessment Is this an Initial Assessment or a Re-assessment for this encounter?: Initial Assessment Marital status: Single Maiden name: NA Is patient pregnant?: No Pregnancy Status: No Living Arrangements: Other (Comment) (reports was in jail, does not want to go back to house) Can pt return to current living arrangement?: Yes Admission Status: Voluntary Is patient capable of signing voluntary admission?: Yes Referral Source: Self/Family/Friend Insurance type: medicare     Crisis Care Plan Living Arrangements: Other (Comment) (reports was in jail, does not want to go back to house) Name of Psychiatrist: none currently reports he did not follow through with appointments set up for him Name of Therapist: none  Education Status Is patient currently in school?: No Current Grade: NA Highest grade of school patient has completed: 12 Name of school: NA Contact person: NA  Risk to self with the past 6 months Suicidal Ideation: Yes-Currently Present Has patient been a risk to self within the past 6 months prior to admission? :  (multiple threats but no knowwn attemtps in past 6 months) Suicidal Intent: No Has patient had any suicidal intent within the past 6 months prior to admission? : No Is patient at risk for suicide?: Yes Suicidal Plan?: No Has patient had any suicidal plan within the  past 6 months prior to admission? : Yes Specify Current Suicidal Plan: talks about jumping off bridges, or running in traffic  Access to Means: Yes Specify Access to Suicidal Means: traffic, bridge What has been your use of drugs/alcohol within the last 12 months?: Pt has been abusing cocaine/crack daily for many years Previous Attempts/Gestures: No How many times?: 0 Other Self Harm Risks: impaired while on the streets, using street drugs Triggers for Past Attempts: None known Intentional Self Injurious Behavior: None Comment - Self Injurious Behavior: none Family Suicide History: No Recent stressful life event(s): Other (Comment) (reports his medications are not helpful to him, was in jail ) Persecutory voices/beliefs?: Yes Depression: Yes Depression Symptoms: Insomnia, Feeling worthless/self pity, Feeling angry/irritable Substance abuse history and/or treatment for substance abuse?: Yes Suicide prevention information given to non-admitted patients: Yes  Risk to Others within the past 6 months Homicidal Ideation: Yes-Currently Present Does patient have any lifetime risk of  violence toward others beyond the six months prior to admission? : No Thoughts of Harm to Others: Yes-Currently Present Comment - Thoughts of Harm to Others: pt reports an increase in paranoia and wanting trying to cut people's heads off and kill people Current Homicidal Intent: Yes-Currently Present Current Homicidal Plan: No-Not Currently/Within Last 6 Months Describe Current Homicidal Plan: reports he has been trying to cut people's head off, and that if he is not sent any where no one should say anything to him when he begs for money or kills some one  Access to Homicidal Means: No Describe Access to Homicidal Means: none, previously reported he was hurting people with his mind Identified Victim: random people, people who try to help him History of harm to others?: No Assessment of Violence: On  admission Violent Behavior Description: reports he has been yelling at people, making threats and trying to cut people's heads off Does patient have access to weapons?: No Criminal Charges Pending?: No Does patient have a court date: No Is patient on probation?: Unknown (reports just got out of jail )  Psychosis Hallucinations: Auditory, Visual (sees monsters, and giants) Delusions: Persecutory (reports paranoid )  Mental Status Report Appearance/Hygiene: Disheveled, In scrubs Eye Contact: Good Motor Activity: Rigidity Speech: Loud, Pressured (screaming ) Level of Consciousness: Alert, Irritable Mood: Angry Affect: Labile Anxiety Level: Moderate Thought Processes: Coherent, Relevant (influenced by AVH and delusions) Judgement: Impaired Orientation: Person, Place, Situation (reports he does not know date or even year) Obsessive Compulsive Thoughts/Behaviors: Unable to Assess  Cognitive Functioning Concentration: Decreased Memory: Recent Intact, Remote Intact IQ: Average Insight: Poor Impulse Control: Poor Appetite: Good Weight Loss: 0 Weight Gain: 0 Sleep: Decreased Total Hours of Sleep:  (pt unsure) Vegetative Symptoms: Unable to Assess  ADLScreening [facility] Assessment Services) Patient's cognitive ability adequate to safely complete daily activities?: Yes Patient able to express need for assistance with ADLs?: Yes Independently performs ADLs?: Yes (appropriate for developmental age)  Prior Inpatient Therapy Prior Inpatient Therapy: Yes Prior Therapy Dates: Multiple Prior Therapy Facilty/Provider(s): Multiple Reason for Treatment: Psychosis  Prior Outpatient Therapy Prior Outpatient Therapy: Yes Prior Therapy Dates: Multiple Prior Therapy Facilty/Provider(s): none currently, [name] recently  Reason for Treatment: med mgmt Does patient have an ACCT team?: No (reports it was set up but he failed to follow through ) Does patient have Intensive In-House Services?  :  No Does patient have [name] services? : No (in the past, reports not currently ) Does patient have [facility] services?: No  ADL Screening (condition at time of admission) Patient's cognitive ability adequate to safely complete daily activities?: Yes Is the patient deaf or have difficulty hearing?: No Does the patient have difficulty concentrating, remembering, or making decisions?: Yes Patient able to express need for assistance with ADLs?: Yes Does the patient have difficulty dressing or bathing?: No Independently performs ADLs?: Yes (appropriate for developmental age) Does the patient have difficulty walking or climbing stairs?: No Weakness of Legs: None Weakness of Arms/Hands: None  Home Assistive Devices/Equipment Home Assistive Devices/Equipment: None    Abuse/Neglect Assessment (Assessment to be complete while patient is alone) Physical Abuse: Denies Verbal Abuse: Denies Sexual Abuse: Denies Exploitation of patient/patient's resources: Denies Self-Neglect: Denies Values / Beliefs Cultural Requests During Hospitalization: None Spiritual Requests During Hospitalization: None   Advance Directives (For Healthcare) Does patient have an advance directive?: No Would patient like information on creating an advanced directive?: No - patient declined information    Additional Information 1:1 In Past 12 Months?: No CIRT  Risk: No Elopement Risk: No Does patient have medical clearance?: No     Disposition:  Per Hulan Fess, NP pt needs AM psychiatric evaluation for final disposition recommendations.   Informed Dr. Criss Alvine of recommendations and he is in agreement.   Informed RN who will inform pt.   Clista Bernhardt, [facility] Triage Specialist 11/04/2014 2:37 AM  Disposition Initial Assessment Completed for this Encounter: Yes  Lyndie Vanderloop M 11/04/2014 2:21 AM

## 2014-11-05 DIAGNOSIS — F200 Paranoid schizophrenia: Secondary | ICD-10-CM | POA: Diagnosis not present

## 2014-11-05 MED ORDER — NICOTINE 21 MG/24HR TD PT24
21.0000 mg | MEDICATED_PATCH | Freq: Every day | TRANSDERMAL | Status: DC
  Administered 2014-11-05: 21 mg via TRANSDERMAL
  Filled 2014-11-05: qty 1

## 2014-11-05 MED ORDER — ARIPIPRAZOLE 10 MG PO TABS: 10.0000 mg | ORAL_TABLET | Freq: Every day | ORAL | Status: DC

## 2014-11-05 MED ORDER — BENZTROPINE MESYLATE 2 MG PO TABS: 2.0000 mg | ORAL_TABLET | Freq: Two times a day (BID) | ORAL | Status: DC

## 2014-11-05 NOTE — ED Notes (Addendum)
Pt up in hallway, asking for coffee. RN informed him that he can have some in the morning time, d/t POD C snack time rules. Pt unhappy with this answer, states his insurance pays for this and he wants what he wants. States he wants discharge papers if he can't get coffee. Swearing at RN at this time. Asking to speak with charge nurse. Charge RN informed patient of same rules, pt continues to ask for discharge paperwork.

## 2014-11-05 NOTE — ED Provider Notes (Signed)
Concern by ED RN regarding pt's erratic and paranoid behavior. Pt with disorganized thoughts and speech pattern. TTS asked to re-eval pt: [facility], placement pending at this time. Pt will hold in ED.   Samuel JesterKathleen Kateleen Encarnacion, DO 11/05/14 [phone]

## 2014-11-05 NOTE — ED Notes (Signed)
telepsych in progress 

## 2014-11-05 NOTE — ED Notes (Signed)
Patients clothing were packaged by ed staff in plastic belongings bags soaking wet. Upon returning these items to patient they were still soaking wet, and now very oderous. Patient discharged but will stay on pod c in his room until we can launder his clothing and return them to him,.

## 2014-11-05 NOTE — Discharge Instructions (Signed)
Follow up with your mental health providers within the next several days for a recheck and for further medication management.  Return to the ED if needed.

## 2014-11-05 NOTE — ED Notes (Signed)
Pt showering at this time

## 2014-11-05 NOTE — [facility] Counselor (Signed)
Counselor received an authorization from clinician at [facility] for this pt. Authorization# [id number] and is valid from 5/7-5/13. Counselor completed Regional Referral form for state [facility] and faxed over supporting documentation for this pt to [facility] to obtain placement. This information is also updated on shift report.  Ardelle ParkLatoya McNeil, MA OBS Counselor

## 2014-11-05 NOTE — Consult Note (Signed)
Telepsych Consultation   Reason for Consult:  Suicidal Ideation (day 3 in ED) Referring Physician:  EDP [name] is an 54 y.o. male.  Time spent on patient: 50 minutes  Assessment: Chronic paranoid schizophrenia  Past Medical History  Diagnosis Date  . Diabetes mellitus without complication   . Hypertension   . Schizophrenia   . CHF (congestive heart failure)   . Neuropathy   . Polysubstance abuse   . Cocaine abuse   . Homelessness   . Hepatitis C   . Chronic foot pain   . Homelessness     Subjective:  Pt seen and chart reviewed. He continues to appear to be at baseline. Pt told the EDP last night, who was unfamiliar with this pt, that he needed to go to a group home because he was psychotic. However, pt is well-known to this NP for 10 yrs and also to nursing staff member Anne Ng and he appears to be at baseline and stable enough for discharge.   From 11/04/14 (my assessment:) [name] is a 54 y.o. male patient presenting to the [facility] initially with suicidal ideation yet denying plan or intent. Pt seen and chart reviewed. Pt is well-known to this NP x 10 years with good rapport. Pt reports that he was recently put in jail for approximately 15 days and that his medication was switched to Prolixin, which pt reports is ineffective. This NP is familiar with pt's concerns in that he has been incarcerated multiple times and switched off Abilify and onto Prolixin. Pt is known to have a history of stability with an Abilify regimen both inpatient at [facility] and outpatient as well with his ACT Team. Therefore, [facility] TTS staff are contacting his ACT Team to facilitated the implementation of referrals to providers that may consider long-acting injectable Abilify therapy for pt to assist with compliance and therapeutic serum levels of this medication. Today, pt denies suicidal/homicidal ideation to this NP, stating that he "just needs help" and that he would like to restart the  medications which have worked for him historically. Pt in agreement to discharge with ACT Team to begin this process.   HPI:  Pt in [place] today; reviewed EDP notes: [name] is a 54 y.o. male with a history of schizophrenia who presents to the Emergency Department complaining of anxiety and requesting ETOH and cocaine detox. Pt notes he is currently out of his abilify and other meds; states during recent incarceration he was receiving all of his meds. Pt reports associated visual hallucinations for the last 48 hours and suicidal ideation but states he wants help. He denies CP, SOB, cough and HA. No alleviating factors noted.   Past Psychiatric History: Past Medical History  Diagnosis Date  . Diabetes mellitus without complication   . Hypertension   . Schizophrenia   . CHF (congestive heart failure)   . Neuropathy   . Polysubstance abuse   . Cocaine abuse   . Homelessness   . Hepatitis C   . Chronic foot pain   . Homelessness     reports that he has been smoking Cigarettes.  He has a 20 pack-year smoking history. He uses smokeless tobacco. He reports that he drinks alcohol. He reports that he uses illicit drugs ("Crack" cocaine, Cocaine, and Marijuana) about 7 times per week. Family History  Problem Relation Age of Onset  . Hypertension Other   . Diabetes Other    Family History Substance Abuse: Yes, Describe: Family Supports: Yes,  List: (cousin Candice) Living Arrangements: Other (Comment) (reports was in jail, does not want to go back to house) Can pt return to current living arrangement?: Yes (reports he feels he will get in trouble) Allergies:   Allergies  Allergen Reactions  . Haldol [Haloperidol] Other (See Comments)    Muscle spasms, loss of voluntary movement. However, pt has taken Thorazine on multiple occasions with no adverse effects.     ACT Assessment Complete:  Yes:    Educational Status    Risk to Self: Risk to self with the past 6 months Suicidal  Ideation: Yes-Currently Present Has patient been a risk to self within the past 6 months prior to admission? :  (multiple threats but no knowwn attemtps in past 6 months) Suicidal Intent: Yes-Currently Present Has patient had any suicidal intent within the past 6 months prior to admission? : No Is patient at risk for suicide?: No Suicidal Plan?:  (no specific plan noted today ) Has patient had any suicidal plan within the past 6 months prior to admission? : Yes Specify Current Suicidal Plan: no specific plans noted today, has said plan to jump off bridge or in traffic  Access to Means: Yes Specify Access to Suicidal Means: traffic, bridge  What has been your use of drugs/alcohol within the last 12 months?: using cocaine and alcohol  Previous Attempts/Gestures: No How many times?: 0 Other Self Harm Risks: hallucinations telling him to hurt himself and others at times Triggers for Past Attempts: None known Intentional Self Injurious Behavior: None Comment - Self Injurious Behavior: none Family Suicide History: No Recent stressful life event(s):  (feels unable to manage his sx ) Persecutory voices/beliefs?: Yes Depression: Yes Depression Symptoms: Insomnia, Despondent, Tearfulness, Feeling worthless/self pity, Feeling angry/irritable, Loss of interest in usual pleasures, Isolating Substance abuse history and/or treatment for substance abuse?: Yes ([facility]) Suicide prevention information given to non-admitted patients: Yes  Risk to Others: Risk to Others within the past 6 months Homicidal Ideation: Yes-Currently Present Does patient have any lifetime risk of violence toward others beyond the six months prior to admission? : No Thoughts of Harm to Others: Yes-Currently Present Comment - Thoughts of Harm to Others: would not specify  Current Homicidal Intent: Yes-Currently Present Current Homicidal Plan: No Describe Current Homicidal Plan: would not give details today Access to Homicidal  Means:  (UTA) Describe Access to Homicidal Means: UTA Identified Victim: random  History of harm to others?: No Assessment of Violence: None Noted (threats only reported ) Violent Behavior Description: threats only  Does patient have access to weapons?: No Criminal Charges Pending?: No Does patient have a court date: No Is patient on probation?: Unknown  Abuse: Abuse/Neglect Assessment (Assessment to be complete while patient is alone) Physical Abuse: Denies Verbal Abuse: Denies Sexual Abuse: Denies Exploitation of patient/patient's resources: Denies Self-Neglect: Denies  Prior Inpatient Therapy: Prior Inpatient Therapy Prior Inpatient Therapy: Yes Prior Therapy Dates: Multiple Prior Therapy Facilty/Provider(s): Multiple Reason for Treatment: Psychosis  Prior Outpatient Therapy: Prior Outpatient Therapy Prior Outpatient Therapy: Yes Prior Therapy Dates: Multiple Prior Therapy Facilty/Provider(s): none currently, [name] recently  Reason for Treatment: med mgmt Does patient have an ACCT team?:  (pt was to follow up with team today, has been inactive ) Does patient have Intensive In-House Services?  : No Does patient have [facility] services? :  (was to folowo up with them today ) Does patient have P4CC services?: No  Additional Information: Additional Information 1:1 In Past 12 Months?: No CIRT Risk: No Elopement  Risk: No Does patient have medical clearance?: Yes     Objective: Blood pressure 163/94, pulse 96, temperature 98 F (36.7 C), temperature source Oral, resp. rate 28, height '5\' 9"'  (1.753 m), weight 75.751 kg (167 lb), SpO2 100 %.Body mass index is 24.65 kg/(m^2). Results for orders placed or performed during the hospital encounter of 11/03/14 (from the past 72 hour(s))  Ethanol     Status: None   Collection Time: 11/04/14 12:19 AM  Result Value Ref Range   Alcohol, Ethyl (B) <5 <5 mg/dL    Comment:        LOWEST DETECTABLE LIMIT FOR SERUM ALCOHOL IS 11 mg/dL FOR  MEDICAL PURPOSES ONLY   CBC with Differential     Status: Abnormal   Collection Time: 11/04/14 12:19 AM  Result Value Ref Range   WBC 7.5 4.0 - 10.5 K/uL   RBC 4.42 4.22 - 5.81 MIL/uL   Hemoglobin 12.5 (L) 13.0 - 17.0 g/dL   HCT 39.2 39.0 - 52.0 %   MCV 88.7 78.0 - 100.0 fL   MCH 28.3 26.0 - 34.0 pg   MCHC 31.9 30.0 - 36.0 g/dL   RDW 13.3 11.5 - 15.5 %   Platelets 166 150 - 400 K/uL   Neutrophils Relative % 80 (H) 43 - 77 %   Neutro Abs 6.0 1.7 - 7.7 K/uL   Lymphocytes Relative 12 12 - 46 %   Lymphs Abs 0.9 0.7 - 4.0 K/uL   Monocytes Relative 8 3 - 12 %   Monocytes Absolute 0.6 0.1 - 1.0 K/uL   Eosinophils Relative 0 0 - 5 %   Eosinophils Absolute 0.0 0.0 - 0.7 K/uL   Basophils Relative 0 0 - 1 %   Basophils Absolute 0.0 0.0 - 0.1 K/uL  Comprehensive metabolic panel     Status: Abnormal   Collection Time: 11/04/14 12:19 AM  Result Value Ref Range   Sodium 145 135 - 145 mmol/L   Potassium 3.2 (L) 3.5 - 5.1 mmol/L   Chloride 106 101 - 111 mmol/L   CO2 27 22 - 32 mmol/L   Glucose, Bld 124 (H) 70 - 99 mg/dL   BUN 14 6 - 20 mg/dL   Creatinine, Ser 0.92 0.61 - 1.24 mg/dL   Calcium 9.7 8.9 - 10.3 mg/dL   Total Protein 7.4 6.5 - 8.1 g/dL   Albumin 4.2 3.5 - 5.0 g/dL   AST 24 15 - 41 U/L   ALT 11 (L) 17 - 63 U/L   Alkaline Phosphatase 95 38 - 126 U/L   Total Bilirubin 0.4 0.3 - 1.2 mg/dL   GFR calc non Af Amer >60 >60 mL/min   GFR calc Af Amer >60 >60 mL/min    Comment: (NOTE) The eGFR has been calculated using the CKD EPI equation. This calculation has not been validated in all clinical situations. eGFR's persistently <60 mL/min signify possible Chronic Kidney Disease.    Anion gap 12 5 - 15  Drug screen panel, emergency     Status: Abnormal   Collection Time: 11/04/14  8:09 AM  Result Value Ref Range   Opiates NONE DETECTED NONE DETECTED   Cocaine POSITIVE (A) NONE DETECTED   Benzodiazepines NONE DETECTED NONE DETECTED   Amphetamines NONE DETECTED NONE DETECTED    Tetrahydrocannabinol NONE DETECTED NONE DETECTED   Barbiturates POSITIVE (A) NONE DETECTED    Comment:        DRUG SCREEN FOR MEDICAL PURPOSES ONLY.  IF CONFIRMATION IS NEEDED FOR ANY  PURPOSE, NOTIFY LAB WITHIN 5 DAYS.        LOWEST DETECTABLE LIMITS FOR URINE DRUG SCREEN Drug Class       Cutoff (ng/mL) Amphetamine      1000 Barbiturate      200 Benzodiazepine   657 Tricyclics       846 Opiates          300 Cocaine          300 THC              50    Labs are reviewed and UDS pertinent for positive cocaine and barbiturates   Current Facility-Administered Medications  Medication Dose Route Frequency Provider Last Rate Last Dose  . acetaminophen (TYLENOL) tablet 650 mg  650 mg Oral Q4H PRN Sherwood Gambler, MD   650 mg at 11/05/14 0440  . ARIPiprazole (ABILIFY) tablet 10 mg  10 mg Oral Daily Sherwood Gambler, MD   10 mg at 11/05/14 1046  . benazepril (LOTENSIN) tablet 5 mg  5 mg Oral Daily Sherwood Gambler, MD   5 mg at 11/04/14 1019  . benztropine (COGENTIN) tablet 2 mg  2 mg Oral BID Sherwood Gambler, MD   2 mg at 11/05/14 1046  . furosemide (LASIX) tablet 20 mg  20 mg Oral Daily Sherwood Gambler, MD   20 mg at 11/05/14 1046  . ibuprofen (ADVIL,MOTRIN) tablet 600 mg  600 mg Oral Q8H PRN Sherwood Gambler, MD   600 mg at 11/05/14 0649  . LORazepam (ATIVAN) tablet 0-4 mg  0-4 mg Oral 4 times per day Sherwood Gambler, MD   0 mg at 11/04/14 0023   Followed by  . [START ON 11/06/2014] LORazepam (ATIVAN) tablet 0-4 mg  0-4 mg Oral Q12H Sherwood Gambler, MD      . metFORMIN (GLUCOPHAGE) tablet 500 mg  500 mg Oral Q breakfast Sherwood Gambler, MD   500 mg at 11/05/14 0649  . nicotine (NICODERM CQ - dosed in mg/24 hours) patch 21 mg  21 mg Transdermal Daily Linton Flemings, MD   21 mg at 11/05/14 0649  . tamsulosin (FLOMAX) capsule 0.4 mg  0.4 mg Oral QPC supper Sherwood Gambler, MD   0.4 mg at 11/04/14 1811  . thiamine (VITAMIN B-1) tablet 100 mg  100 mg Oral Daily Sherwood Gambler, MD   100 mg at 11/05/14 1046   Or   . thiamine (B-1) injection 100 mg  100 mg Intravenous Daily Sherwood Gambler, MD      . traZODone (DESYREL) tablet 50 mg  50 mg Oral QHS PRN Sherwood Gambler, MD   50 mg at 11/04/14 9629   Current Outpatient Prescriptions  Medication Sig Dispense Refill  . benazepril (LOTENSIN) 5 MG tablet Take 1 tablet (5 mg total) by mouth daily. For high blood pressure 30 tablet 0  . cloNIDine (CATAPRES) 0.1 MG tablet Take 1 tablet (0.1 mg total) by mouth 2 (two) times daily. For high blood pressure 60 tablet 0  . fluPHENAZine (PROLIXIN) 5 MG tablet Take 5 mg by mouth 2 (two) times daily.    . furosemide (LASIX) 20 MG tablet Take 1 tablet (20 mg total) by mouth daily. For swellings/high blood pressure 30 tablet 3  . metFORMIN (GLUCOPHAGE) 500 MG tablet Take 1 tablet (500 mg total) by mouth daily with breakfast. For high blood sugar control 30 tablet 0  . tamsulosin (FLOMAX) 0.4 MG CAPS capsule Take 1 capsule (0.4 mg total) by mouth daily after supper. For prostate health 30 capsule  0  . traZODone (DESYREL) 50 MG tablet Take 1 tablet (50 mg total) by mouth at bedtime as needed for sleep. 30 tablet 0  . ARIPiprazole (ABILIFY) 10 MG tablet Take 1 tablet (10 mg total) by mouth daily. For mood control 14 tablet 0  . aspirin 81 MG EC tablet Take 1 tablet (81 mg total) by mouth daily. For heart health (Patient not taking: Reported on 11/03/2014) 30 tablet 3  . benztropine (COGENTIN) 2 MG tablet Take 1 tablet (2 mg total) by mouth 2 (two) times daily. For prevention of drug induced tremors 28 tablet 0  . guaiFENesin (MUCINEX) 600 MG 12 hr tablet Take 2 tablets (1,200 mg total) by mouth 2 (two) times daily. (Patient not taking: Reported on 11/03/2014) 30 tablet 0    Psychiatric Specialty Exam:   Review of Systems  Psychiatric/Behavioral: Positive for depression. Negative for suicidal ideas and hallucinations. The patient is nervous/anxious.   All other systems reviewed and are negative.   Blood pressure 163/94, pulse 96,  temperature 98 F (36.7 C), temperature source Oral, resp. rate 28, height '5\' 9"'  (1.753 m), weight 75.751 kg (167 lb), SpO2 100 %.Body mass index is 24.65 kg/(m^2).  General Appearance: Disheveled  Eye Contact::  Good  Speech:  Clear and Coherent  Volume:  Normal  Mood:  Euthymic  Affect:  Appropriate  Thought Process:  Goal Directed  Orientation:  Full (Time, Place, and Person)  Thought Content:  WDL  Suicidal Thoughts:  No  Homicidal Thoughts:  No  Memory:  Immediate;   Good Recent;   Good Remote;   Good  Judgement:  Fair  Insight:  Fair  Psychomotor Activity:  Normal  Concentration:  Good  Recall:  Fair  Akathisia:  NA  Handed:  Right  AIMS (if indicated):     Assets:  Communication Skills Desire for Improvement Resilience  Sleep:      Treatment Plan Summary: Chronic paranoid schizophrenia : improving with medications (Abilify, Cogentin) -Coordinated with EDP regarding medication compliance and prescriptions -EDP, please write scripts for 7 days of Abilify/Cogentin as follows: Abilify 58m daily #5 Cogentin 234mbid #10  Disposition:  -Discharge home and [facility] -Provide bus pass if needed  *Reviewed with Dr. CoLeward QuanJoElyse JarvisFNP-BC 11/05/2014 09:41AM     Case discussed with me as above   FeNeita Garnet MD

## 2014-11-05 NOTE — ED Provider Notes (Signed)
11:43 AM I spoke with [name] with [facility].  He knows pt very well and has long clinical relationship with pt.  He felt pt was at baseline and stable for dc home.  Pt himself stated he felt well and desired discharge.  [facility]  Clinical Impression: 1. Chronic paranoid schizophrenia   2. Agitation       Taylor DivineJohn Shriyan Arakawa, MD 11/05/14 [phone]

## 2014-11-07 ENCOUNTER — Encounter (HOSPITAL_COMMUNITY): Payer: Self-pay

## 2014-11-07 ENCOUNTER — Emergency Department (HOSPITAL_COMMUNITY)
Admission: EM | Admit: 2014-11-07 | Discharge: 2014-11-08 | Disposition: A | Discharge status: Home / Self Care | Payer: Medicare Other | Attending: Emergency Medicine | Admitting: Emergency Medicine

## 2014-11-07 DIAGNOSIS — F1420 Cocaine dependence, uncomplicated: Secondary | ICD-10-CM

## 2014-11-07 DIAGNOSIS — R45851 Suicidal ideations: Secondary | ICD-10-CM

## 2014-11-07 DIAGNOSIS — I10 Essential (primary) hypertension: Secondary | ICD-10-CM | POA: Insufficient documentation

## 2014-11-07 DIAGNOSIS — F203 Undifferentiated schizophrenia: Secondary | ICD-10-CM | POA: Diagnosis present

## 2014-11-07 DIAGNOSIS — I509 Heart failure, unspecified: Secondary | ICD-10-CM | POA: Insufficient documentation

## 2014-11-07 DIAGNOSIS — G8929 Other chronic pain: Secondary | ICD-10-CM | POA: Insufficient documentation

## 2014-11-07 DIAGNOSIS — Z8619 Personal history of other infectious and parasitic diseases: Secondary | ICD-10-CM | POA: Insufficient documentation

## 2014-11-07 DIAGNOSIS — F200 Paranoid schizophrenia: Secondary | ICD-10-CM | POA: Diagnosis present

## 2014-11-07 DIAGNOSIS — F209 Schizophrenia, unspecified: Secondary | ICD-10-CM

## 2014-11-07 DIAGNOSIS — Z590 Homelessness: Secondary | ICD-10-CM | POA: Insufficient documentation

## 2014-11-07 DIAGNOSIS — E119 Type 2 diabetes mellitus without complications: Secondary | ICD-10-CM | POA: Insufficient documentation

## 2014-11-07 DIAGNOSIS — F1499 Cocaine use, unspecified with unspecified cocaine-induced disorder: Secondary | ICD-10-CM | POA: Insufficient documentation

## 2014-11-07 DIAGNOSIS — Z720 Tobacco use: Secondary | ICD-10-CM | POA: Insufficient documentation

## 2014-11-07 DIAGNOSIS — Z8669 Personal history of other diseases of the nervous system and sense organs: Secondary | ICD-10-CM | POA: Insufficient documentation

## 2014-11-07 LAB — COMPREHENSIVE METABOLIC PANEL
ALBUMIN: 4.1 g/dL (ref 3.5–5.0)
ALK PHOS: 88 U/L (ref 38–126)
ALT: 10 U/L — AB (ref 17–63)
ANION GAP: 6 (ref 5–15)
AST: 31 U/L (ref 15–41)
BILIRUBIN TOTAL: 0.8 mg/dL (ref 0.3–1.2)
BUN: 20 mg/dL (ref 6–20)
CO2: 26 mmol/L (ref 22–32)
Calcium: 9.5 mg/dL (ref 8.9–10.3)
Chloride: 111 mmol/L (ref 101–111)
Creatinine, Ser: 1 mg/dL (ref 0.61–1.24)
GFR calc Af Amer: >60 mL/min (ref >60)
GFR calc non Af Amer: >60 mL/min (ref >60)
Glucose, Bld: 143 mg/dL — ABNORMAL HIGH (ref 70–99)
POTASSIUM: 3.9 mmol/L (ref 3.5–5.1)
Sodium: 143 mmol/L (ref 135–145)
Total Protein: 7.4 g/dL (ref 6.5–8.1)

## 2014-11-07 LAB — CBC
HEMATOCRIT: 36.4 % — AB (ref 39.0–52.0)
Hemoglobin: 11.5 g/dL — ABNORMAL LOW (ref 13.0–17.0)
MCH: 28.3 pg (ref 26.0–34.0)
MCHC: 31.6 g/dL (ref 30.0–36.0)
MCV: 89.7 fL (ref 78.0–100.0)
Platelets: 168 10*3/uL (ref 150–400)
RBC: 4.06 MIL/uL — ABNORMAL LOW (ref 4.22–5.81)
RDW: 13.5 % (ref 11.5–15.5)
WBC: 6.9 10*3/uL (ref 4.0–10.5)

## 2014-11-07 LAB — RAPID URINE DRUG SCREEN, HOSP PERFORMED
AMPHETAMINES: NOT DETECTED
Barbiturates: POSITIVE — AB
Benzodiazepines: NOT DETECTED
Cocaine: POSITIVE — AB
Opiates: NOT DETECTED
TETRAHYDROCANNABINOL: NOT DETECTED

## 2014-11-07 LAB — ETHANOL: Alcohol, Ethyl (B): <5 mg/dL (ref <5)

## 2014-11-07 LAB — ACETAMINOPHEN LEVEL: Acetaminophen (Tylenol), Serum: <10 ug/mL — ABNORMAL LOW (ref 10–30)

## 2014-11-07 LAB — SALICYLATE LEVEL: Salicylate Lvl: <4 mg/dL (ref 2.8–30.0)

## 2014-11-07 MED ORDER — ACETAMINOPHEN 325 MG PO TABS
650.0000 mg | ORAL_TABLET | Freq: Once | ORAL | Status: AC
  Administered 2014-11-07: 650 mg via ORAL
  Filled 2014-11-07: qty 2

## 2014-11-07 MED ORDER — ARIPIPRAZOLE 10 MG PO TABS
10.0000 mg | ORAL_TABLET | Freq: Every day | ORAL | Status: DC
  Administered 2014-11-07 – 2014-11-08 (×2): 10 mg via ORAL
  Filled 2014-11-07 (×2): qty 1

## 2014-11-07 MED ORDER — CLONIDINE HCL 0.1 MG PO TABS
0.1000 mg | ORAL_TABLET | Freq: Two times a day (BID) | ORAL | Status: DC
  Administered 2014-11-07 – 2014-11-08 (×2): 0.1 mg via ORAL
  Filled 2014-11-07 (×2): qty 1

## 2014-11-07 MED ORDER — BENAZEPRIL HCL 5 MG PO TABS
5.0000 mg | ORAL_TABLET | Freq: Every day | ORAL | Status: DC
  Administered 2014-11-07 – 2014-11-08 (×2): 5 mg via ORAL
  Filled 2014-11-07 (×2): qty 1

## 2014-11-07 MED ORDER — BENZTROPINE MESYLATE 1 MG PO TABS
2.0000 mg | ORAL_TABLET | Freq: Two times a day (BID) | ORAL | Status: DC
  Administered 2014-11-07 – 2014-11-08 (×2): 2 mg via ORAL
  Filled 2014-11-07 (×2): qty 2

## 2014-11-07 MED ORDER — TRAZODONE HCL 50 MG PO TABS
50.0000 mg | ORAL_TABLET | Freq: Every evening | ORAL | Status: DC | PRN
  Administered 2014-11-08: 50 mg via ORAL
  Filled 2014-11-07 (×2): qty 1

## 2014-11-07 MED ORDER — TAMSULOSIN HCL 0.4 MG PO CAPS
0.4000 mg | ORAL_CAPSULE | Freq: Every day | ORAL | Status: DC
  Administered 2014-11-07: 0.4 mg via ORAL
  Filled 2014-11-07 (×2): qty 1

## 2014-11-07 MED ORDER — FLUPHENAZINE HCL 5 MG PO TABS: 5.0000 mg | ORAL_TABLET | Freq: Two times a day (BID) | ORAL | Status: DC

## 2014-11-07 MED ORDER — ASPIRIN EC 81 MG PO TBEC
81.0000 mg | DELAYED_RELEASE_TABLET | Freq: Every day | ORAL | Status: DC
  Administered 2014-11-07 – 2014-11-08 (×2): 81 mg via ORAL
  Filled 2014-11-07 (×2): qty 1

## 2014-11-07 MED ORDER — FUROSEMIDE 20 MG PO TABS
20.0000 mg | ORAL_TABLET | Freq: Every day | ORAL | Status: DC
  Administered 2014-11-07 – 2014-11-08 (×2): 20 mg via ORAL
  Filled 2014-11-07 (×2): qty 1

## 2014-11-07 MED ORDER — NICOTINE 21 MG/24HR TD PT24
21.0000 mg | MEDICATED_PATCH | Freq: Once | TRANSDERMAL | Status: AC
  Administered 2014-11-07: 21 mg via TRANSDERMAL
  Filled 2014-11-07: qty 1

## 2014-11-07 MED ORDER — AMANTADINE HCL 100 MG PO CAPS
100.0000 mg | ORAL_CAPSULE | Freq: Two times a day (BID) | ORAL | Status: DC
  Administered 2014-11-07 – 2014-11-08 (×2): 100 mg via ORAL
  Filled 2014-11-07 (×4): qty 1

## 2014-11-07 MED ORDER — METFORMIN HCL 500 MG PO TABS
500.0000 mg | ORAL_TABLET | Freq: Every day | ORAL | Status: DC
  Administered 2014-11-08: 500 mg via ORAL
  Filled 2014-11-07 (×2): qty 1

## 2014-11-07 MED ORDER — RISPERIDONE 1 MG PO TBDP
1.0000 mg | ORAL_TABLET | Freq: Every day | ORAL | Status: DC
  Filled 2014-11-07: qty 1

## 2014-11-07 NOTE — Consult Note (Signed)
[name]   Reason for Consult: Undifferentiated Schizophrenia, Cocaine use disorder, severe  Referring Physician:  EDP Patient Identification: [name] MRN:  [id number] Principal Diagnosis: Schizophrenia, acute undifferentiated Diagnosis:   Patient Active Problem List   Diagnosis Date Noted  . 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 Cocaine use disorder, severe, dependence [F14.20]     Priority: High  . Schizophrenia, acute undifferentiated [F20.3] 11/07/2014  . 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 Chronic paranoid schizophrenia [F20.0] 09/07/2014  . Acute CHF (congestive heart failure) [I50.9] 07/29/2014  . Essential hypertension, benign [I10] 03/28/2013  . Diabetes mellitus [E11.9] 03/15/2013    Total Time spent with patient: 1 hour  Subjective:   [name] is a 54 y.o. male patient admitted with Schizophrenia undifferenciated type, Cocaine use disorder, severe  HPI:  [name], 54 years old well known to the service was evaluated for seeking help with Cocaine use and Schizophrenia.  Patient also stated that he is diabetic and have not been taking his medications because is homeless and using Cocaine.  Patient stated that he has been losing weight and he is afraid his DM is affecting his well being.  He is also asking for treatment for Cocaine use stating that he is now tired of pan Handling for Cocaine use.  Patient reports feeling hopeless and helpless. He also reported feeling suicidal this time.  Patient comes in seeking help with his Cocaine use and ends up leaving before a place is gotten for him.  We will seek placement while we re-evaluate in am.  HPI Elements:   Location:  Suicidal ideation, Schizophrenia, Cocaine use disorder, severe. Quality:  severe, feelings of hopelessness and helplessness, . Severity:  severe. Timing:  acute,  on going. Duration:  Chronic mental illness and Substance use. Context:  Seeking treatment for DM, Schizophrenia and Cocaine use disorder.  Past Medical History:  Past Medical History  Diagnosis Date  . Diabetes mellitus without complication   . Hypertension   . Schizophrenia   . CHF (congestive heart failure)   . Neuropathy   . Polysubstance abuse   . Cocaine abuse   . Homelessness   . Hepatitis C   . Chronic foot pain   . Homelessness    History reviewed. No pertinent past surgical history. Family History:  Family History  Problem Relation Age of Onset  . Hypertension Other   . Diabetes Other    Social History:  History  Alcohol Use  . Yes    Comment: Pt denies     History  Drug Use  . 7.00 per week  . Special: "Crack" cocaine, Cocaine, Marijuana    Comment: Cocaine tonight, Marijuana "a long time"    History   Social History  . Marital Status: Divorced    Spouse Name: N/A  . Number of Children: N/A  . Years of Education: N/A   Social History Main Topics  . Smoking status: Current Every Day Smoker -- 1.00 packs/day for 20 years    Types: Cigarettes  . Smokeless tobacco: Current User  . Alcohol Use: Yes     Comment: Pt denies  . Drug Use: 7.00 per week    Special: "Crack" cocaine, Cocaine, Marijuana     Comment: Cocaine tonight, Marijuana "a long time"  . Sexual Activity: No   Other Topics Concern  . None   Social History Narrative   **  Merged History Encounter **       Additional Social History:                          Allergies:   Allergies  Allergen Reactions  . Haldol [Haloperidol] Other (See Comments)    Muscle spasms, loss of voluntary movement. However, pt has taken Thorazine on multiple occasions with no adverse effects.     Labs:  Results for orders placed or performed during the hospital encounter of 11/07/14 (from the past 48 hour(s))  Acetaminophen level     Status: Abnormal   Collection Time: 11/07/14  3:26 AM   Result Value Ref Range   Acetaminophen (Tylenol), Serum <10 (L) 10 - 30 ug/mL    Comment:        THERAPEUTIC CONCENTRATIONS VARY SIGNIFICANTLY. A RANGE OF 10-30 ug/mL MAY BE AN EFFECTIVE CONCENTRATION FOR MANY PATIENTS. HOWEVER, SOME ARE BEST TREATED AT CONCENTRATIONS OUTSIDE THIS RANGE. ACETAMINOPHEN CONCENTRATIONS >150 ug/mL AT 4 HOURS AFTER INGESTION AND >50 ug/mL AT 12 HOURS AFTER INGESTION ARE OFTEN ASSOCIATED WITH TOXIC REACTIONS.   CBC     Status: Abnormal   Collection Time: 11/07/14  3:26 AM  Result Value Ref Range   WBC 6.9 4.0 - 10.5 K/uL   RBC 4.06 (L) 4.22 - 5.81 MIL/uL   Hemoglobin 11.5 (L) 13.0 - 17.0 g/dL   HCT 36.4 (L) 39.0 - 52.0 %   MCV 89.7 78.0 - 100.0 fL   MCH 28.3 26.0 - 34.0 pg   MCHC 31.6 30.0 - 36.0 g/dL   RDW 13.5 11.5 - 15.5 %   Platelets 168 150 - 400 K/uL  Comprehensive metabolic panel     Status: Abnormal   Collection Time: 11/07/14  3:26 AM  Result Value Ref Range   Sodium 143 135 - 145 mmol/L   Potassium 3.9 3.5 - 5.1 mmol/L   Chloride 111 101 - 111 mmol/L   CO2 26 22 - 32 mmol/L   Glucose, Bld 143 (H) 70 - 99 mg/dL   BUN 20 6 - 20 mg/dL   Creatinine, Ser 1.00 0.61 - 1.24 mg/dL   Calcium 9.5 8.9 - 10.3 mg/dL   Total Protein 7.4 6.5 - 8.1 g/dL   Albumin 4.1 3.5 - 5.0 g/dL   AST 31 15 - 41 U/L   ALT 10 (L) 17 - 63 U/L   Alkaline Phosphatase 88 38 - 126 U/L   Total Bilirubin 0.8 0.3 - 1.2 mg/dL   GFR calc non Af Amer >60 >60 mL/min   GFR calc Af Amer >60 >60 mL/min    Comment: (NOTE) The eGFR has been calculated using the CKD EPI equation. This calculation has not been validated in all clinical situations. eGFR's persistently <60 mL/min signify possible Chronic Kidney Disease.    Anion gap 6 5 - 15  Ethanol (ETOH)     Status: None   Collection Time: 11/07/14  3:26 AM  Result Value Ref Range   Alcohol, Ethyl (B) <5 <5 mg/dL    Comment:        LOWEST DETECTABLE LIMIT FOR SERUM ALCOHOL IS 11 mg/dL FOR MEDICAL PURPOSES ONLY    Salicylate level     Status: None   Collection Time: 11/07/14  3:26 AM  Result Value Ref Range   Salicylate Lvl <6.2 2.8 - 30.0 mg/dL  Urine Drug Screen     Status: Abnormal   Collection Time: 11/07/14  6:59 AM  Result Value  Ref Range   Opiates NONE DETECTED NONE DETECTED   Cocaine POSITIVE (A) NONE DETECTED   Benzodiazepines NONE DETECTED NONE DETECTED   Amphetamines NONE DETECTED NONE DETECTED   Tetrahydrocannabinol NONE DETECTED NONE DETECTED   Barbiturates POSITIVE (A) NONE DETECTED    Comment:        DRUG SCREEN FOR MEDICAL PURPOSES ONLY.  IF CONFIRMATION IS NEEDED FOR ANY PURPOSE, NOTIFY LAB WITHIN 5 DAYS.        LOWEST DETECTABLE LIMITS FOR URINE DRUG SCREEN Drug Class       Cutoff (ng/mL) Amphetamine      1000 Barbiturate      200 Benzodiazepine   384 Tricyclics       665 Opiates          300 Cocaine          300 THC              50     Vitals: Blood pressure 146/75, pulse 86, temperature 98.5 F (36.9 C), temperature source Oral, resp. rate 13, SpO2 98 %.  Risk to Self: Suicidal Ideation: Yes-Currently Present Suicidal Intent: No-Not Currently/Within Last 6 Months Is patient at risk for suicide?: Yes Suicidal Plan?: Yes-Currently Present Specify Current Suicidal Plan: "Bang my head on the wall"  Access to Means: Yes Specify Access to Suicidal Means: Pt has access to wall and can bang his head on any hard object. What has been your use of drugs/alcohol within the last 12 months?: Daily crack/cocaine use reported.  How many times?: 0 Other Self Harm Risks: No other self harm risk identified at this time.  Triggers for Past Attempts: None known Intentional Self Injurious Behavior: None Risk to Others: Homicidal Ideation: No Thoughts of Harm to Others: No-Not Currently Present/Within Last 6 Months Current Homicidal Intent: No-Not Currently/Within Last 6 Months Current Homicidal Plan: No-Not Currently/Within Last 6 Months Access to Homicidal Means:  No Identified Victim: NA History of harm to others?: No Assessment of Violence: On admission Violent Behavior Description: No violent behaviors observed. Pt is calm and cooperative at this time.  Does patient have access to weapons?: No Criminal Charges Pending?: Yes Describe Pending Criminal Charges: Panhandling  Does patient have a court date: Yes Court Date: 11/15/14 Prior Inpatient Therapy: Prior Inpatient Therapy: Yes Prior Therapy Dates: Multiple Prior Therapy Facilty/Provider(s): Multiple Reason for Treatment: Psychosis Prior Outpatient Therapy: Prior Outpatient Therapy: Yes Prior Therapy Dates: Multiple Prior Therapy Facilty/Provider(s): none currently, [name] recently  Reason for Treatment: med mgmt Does patient have an ACCT team?: No Does patient have Intensive In-House Services?  : No Does patient have [name] services? : No Does patient have P4CC services?: No  Current Facility-Administered Medications  Medication Dose Route Frequency Provider Last Rate Last Dose  . nicotine (NICODERM CQ - dosed in mg/24 hours) patch 21 mg  21 mg Transdermal Once Delora Fuel, MD   21 mg at 99/35/70 1779   Current Outpatient Prescriptions  Medication Sig Dispense Refill  . ARIPiprazole (ABILIFY) 10 MG tablet Take 1 tablet (10 mg total) by mouth daily. For mood control 14 tablet 0  . aspirin 81 MG EC tablet Take 1 tablet (81 mg total) by mouth daily. For heart health 30 tablet 3  . benazepril (LOTENSIN) 5 MG tablet Take 1 tablet (5 mg total) by mouth daily. For high blood pressure 30 tablet 0  . benztropine (COGENTIN) 2 MG tablet Take 1 tablet (2 mg total) by mouth 2 (two) times daily. For prevention of  drug induced tremors 28 tablet 0  . cloNIDine (CATAPRES) 0.1 MG tablet Take 1 tablet (0.1 mg total) by mouth 2 (two) times daily. For high blood pressure 60 tablet 0  . fluPHENAZine (PROLIXIN) 5 MG tablet Take 5 mg by mouth 2 (two) times daily.    . furosemide (LASIX) 20 MG tablet Take 1  tablet (20 mg total) by mouth daily. For swellings/high blood pressure 30 tablet 3  . guaiFENesin (MUCINEX) 600 MG 12 hr tablet Take 2 tablets (1,200 mg total) by mouth 2 (two) times daily. (Patient not taking: Reported on 11/03/2014) 30 tablet 0  . metFORMIN (GLUCOPHAGE) 500 MG tablet Take 1 tablet (500 mg total) by mouth daily with breakfast. For high blood sugar control 30 tablet 0  . tamsulosin (FLOMAX) 0.4 MG CAPS capsule Take 1 capsule (0.4 mg total) by mouth daily after supper. For prostate health 30 capsule 0  . traZODone (DESYREL) 50 MG tablet Take 1 tablet (50 mg total) by mouth at bedtime as needed for sleep. 30 tablet 0    ROS is negative at this time.  Musculoskeletal: Strength & Muscle Tone: within normal limits Gait & Station: normal Patient leans: N/A  Psychiatric Specialty Exam:     Blood pressure 146/75, pulse 86, temperature 98.5 F (36.9 C), temperature source Oral, resp. rate 13, SpO2 98 %.There is no weight on file to calculate BMI.  General Appearance: Casual and Disheveled  Eye Contact::  Minimal  Speech:  Clear and Coherent and Normal Rate  Volume:  Normal  Mood:  Anxious, Depressed, Hopeless and helpless  Affect:  Congruent, Depressed and Flat  Thought Process:  Coherent  Orientation:  Full (Time, Place, and Person)  Thought Content:  WDL  Suicidal Thoughts:  Yes.  without intent/plan  Homicidal Thoughts:  No  Memory:  Immediate;   Fair Recent;   Fair Remote;   Fair  Judgement:  Poor  Insight:  Shallow  Psychomotor Activity:  Psychomotor Retardation  Concentration:  Poor  Recall:  NA  Fund of Knowledge:Poor  Language: Good  Akathisia:  NA  Handed:  Right  AIMS (if indicated):     Assets:  Desire for Improvement Financial Resources/Insurance Housing Transportation  ADL's:  Impaired  Cognition: WNL  Sleep:      Medical Decision Making: Review of Psycho-Social Stressors (1), Established Problem, Worsening (2), Review of Medication Regimen & Side  Effects (2) and Review of New Medication or Change in Dosage (2)  Treatment Plan Summary: Daily contact with patient to assess and evaluate symptoms and progress in treatment, Medication management and Plan Seek placement at any hospital with available inpatient Psychiatric bed.  Will start  patient on Amantadine 100 mg twice a day for Cocaine craving, Will start him on 1 mg Risperdal po for Schizophrenia, Will use Ativan 1 mg po every 8 hours for Aitation and aggression.  Plan:  Recommend psychiatric Inpatient admission when medically cleared. Will re-evaluate in am if no bed is available. Disposition: see above  Delfin Gant   PMHNP-BC 11/07/2014 1:12 PM Patient seen face-to-face for psychiatric evaluation, chart reviewed and case discussed with the physician extender and developed treatment plan. Reviewed the information documented and agree with the treatment plan. Corena Pilgrim, MD

## 2014-11-07 NOTE — ED Notes (Signed)
Patient cooperative. Denies SI, HI, AVH. Rates anxiety 5/10, depression 0/10. States sleep has been poor r/t cocaine abuse recently. Patient appears drowsy, states he has been catching up on sleep since admission.   Encouragement offered. Snack provided. Ordered medications given.  Q 15 safety checks continue.

## 2014-11-07 NOTE — ED Notes (Signed)
Patient has been cooperative since coming back to the unit.  Stated at first that he had to leave because he has to be in court tomorrow.  Advised he would have to wait and see the doctor in the morning.  Patient was accepting of this.

## 2014-11-07 NOTE — ED Notes (Signed)
Patient is lying in a supine position. Resting quietly with eyes closed. Appears in no distress. Respirations are even, regular, and unlabored.

## 2014-11-07 NOTE — BH Assessment (Addendum)
Tele Assessment Note   Taylor DouglasFredrick L [name] is an 54 y.o. male presenting to [facility] reporting suicidal ideations. Pt stated "I haven't been doing nothing but getting into trouble". "I am trying to get some help". "I am a mental pt I can't do it by myself". "I am on crack and I am trying to get off". Pt is endorsing suicidal ideations with a plan to "bang my head on the wall. Pt did not report any previous suicide attempts but shared that he has been hospitalized several times in the past. Pt denies HI and AVH at this time.e PT denied having access to weapons or firearms. Pt reported that he smokes crack/cocaine daily. Pt did not report other illicit substance abuse. It is recommended that pt be evaluated by psychiatry in the am.   Axis I: Schizophrenia; Cocaine Use Disorder, Severe  Past Medical History:  Past Medical History  Diagnosis Date  . Diabetes mellitus without complication   . Hypertension   . Schizophrenia   . CHF (congestive heart failure)   . Neuropathy   . Polysubstance abuse   . Cocaine abuse   . Homelessness   . Hepatitis C   . Chronic foot pain   . Homelessness     History reviewed. No pertinent past surgical history.  Family History:  Family History  Problem Relation Age of Onset  . Hypertension Other   . Diabetes Other     Social History:  reports that he has been smoking Cigarettes.  He has a 20 pack-year smoking history. He uses smokeless tobacco. He reports that he drinks alcohol. He reports that he uses illicit drugs ("Crack" cocaine, Cocaine, and Marijuana) about 7 times per week.  Additional Social History:     CIWA: CIWA-Ar BP: 144/76 mmHg Pulse Rate: 95 COWS:    PATIENT STRENGTHS: (choose at least two) Average or above average intelligence Communication skills  Allergies:  Allergies  Allergen Reactions  . Haldol [Haloperidol] Other (See Comments)    Muscle spasms, loss of voluntary movement. However, pt has taken Thorazine on multiple occasions  with no adverse effects.     Home Medications:  (Not in a hospital admission)  OB/GYN Status:  No LMP for male patient.  General Assessment Data Location of Assessment: [facility] T[facility] Assessment: In system Is this a Tele or Face-to-Face Assessment?: Face-to-Face Is this an Initial Assessment or a Re-assessment for this encounter?: Initial Assessment Marital status: Single Living Arrangements: Other (Comment) (reports was in jail, does not want to go back to house) Can pt return to current living arrangement?: Yes Admission Status: Voluntary Is patient capable of signing voluntary admission?: Yes Referral Source: Self/Family/Friend Insurance type: Medicare     Crisis Care Plan Living Arrangements: Other (Comment) (reports was in jail, does not want to go back to house) Name of Psychiatrist: none currently reports he did not follow through with appointments set up for him (No provider reported at this time. ) Name of Therapist: No provider reported at this time.   Education Status Is patient currently in school?: No Current Grade: NA Highest grade of school patient has completed: 12 Name of school: NA Contact person: NA  Risk to self with the past 6 months Suicidal Ideation: Yes-Currently Present Has patient been a risk to self within the past 6 months prior to admission? : No (multiple threats but no knowwn attemtps in past 6 months) Suicidal Intent: No-Not Currently/Within Last 6 Months Has patient had any suicidal intent within the past  6 months prior to admission? : Yes Is patient at risk for suicide?: Yes Suicidal Plan?: Yes-Currently Present Has patient had any suicidal plan within the past 6 months prior to admission? : Yes Specify Current Suicidal Plan: "Bang my head on the wall"  Access to Means: Yes Specify Access to Suicidal Means: Pt has access to wall and can bang his head on any hard object. What has been your use of drugs/alcohol within the last 12 months?: Daily  crack/cocaine use reported.  Previous Attempts/Gestures: No How many times?: 0 Other Self Harm Risks: No other self harm risk identified at this time.  Triggers for Past Attempts: None known Intentional Self Injurious Behavior: None Family Suicide History: No Recent stressful life event(s): Financial Problems, Legal Issues (Substance abuse ) Persecutory voices/beliefs?: No Depression: Yes Depression Symptoms: Tearfulness, Insomnia, Despondent, Feeling angry/irritable, Feeling worthless/self pity Substance abuse history and/or treatment for substance abuse?: Yes Suicide prevention information given to non-admitted patients: Not applicable  Risk to Others within the past 6 months Homicidal Ideation: No Does patient have any lifetime risk of violence toward others beyond the six months prior to admission? : No Thoughts of Harm to Others: No-Not Currently Present/Within Last 6 Months Current Homicidal Intent: No-Not Currently/Within Last 6 Months Current Homicidal Plan: No-Not Currently/Within Last 6 Months Access to Homicidal Means: No Identified Victim: NA History of harm to others?: No Assessment of Violence: On admission Violent Behavior Description: No violent behaviors observed. Pt is calm and cooperative at this time.  Does patient have access to weapons?: No Criminal Charges Pending?: Yes Describe Pending Criminal Charges: Panhandling  Does patient have a court date: Yes Court Date: 11/15/14 Is patient on probation?: No  Psychosis Hallucinations: None noted Delusions: None noted  Mental Status Report Appearance/Hygiene: Disheveled, In scrubs, Body odor Eye Contact: Poor Motor Activity: Shuffling Speech: Logical/coherent Level of Consciousness: Drowsy Mood: Pleasant, Euthymic Affect: Flat Anxiety Level: Minimal Thought Processes: Coherent, Relevant Judgement: Partial Orientation: Appropriate for developmental age Obsessive Compulsive Thoughts/Behaviors:  None  Cognitive Functioning Concentration: Fair Memory: Recent Intact IQ: Average Insight: Poor Impulse Control: Poor Appetite: Good Weight Loss: 0 Weight Gain: 0 Sleep: Unable to Assess Vegetative Symptoms: Not bathing, Decreased grooming  ADLScreening [facility] Assessment Services) Patient's cognitive ability adequate to safely complete daily activities?: Yes Patient able to express need for assistance with ADLs?: Yes Independently performs ADLs?: Yes (appropriate for developmental age)  Prior Inpatient Therapy Prior Inpatient Therapy: Yes Prior Therapy Dates: Multiple Prior Therapy Facilty/Provider(s): Multiple Reason for Treatment: Psychosis  Prior Outpatient Therapy Prior Outpatient Therapy: Yes Prior Therapy Dates: Multiple Prior Therapy Facilty/Provider(s): none currently, [name] recently  Reason for Treatment: med mgmt Does patient have an ACCT team?: No Does patient have Intensive In-House Services?  : No Does patient have [name] services? : No Does patient have P4CC services?: No  ADL Screening (condition at time of admission) Patient's cognitive ability adequate to safely complete daily activities?: Yes Is the patient deaf or have difficulty hearing?: No Does the patient have difficulty seeing, even when wearing glasses/contacts?: No Does the patient have difficulty concentrating, remembering, or making decisions?: No Patient able to express need for assistance with ADLs?: Yes Does the patient have difficulty dressing or bathing?: No Independently performs ADLs?: Yes (appropriate for developmental age)       Abuse/Neglect Assessment (Assessment to be complete while patient is alone) Physical Abuse: Denies Verbal Abuse: Denies Sexual Abuse: Denies Exploitation of patient/patient's resources: Denies Self-Neglect: Denies     Merchant navy officerAdvance Directives (For  Healthcare) Does patient have an advance directive?: No    Additional Information 1:1 In Past 12 Months?:  No CIRT Risk: No Elopement Risk: No Does patient have medical clearance?: Yes     Disposition:  Disposition Initial Assessment Completed for this Encounter: Yes Disposition of Patient: Other dispositions Other disposition(s): Other (Comment) (Pt reached max therapeutic benefit; probable milingering)  [name] S 11/07/2014 6:37 AM

## 2014-11-07 NOTE — ED Notes (Signed)
Pt presents via EMS with c/o mental evaluation. Pt denied SI or HI to EMS. Reported that he has been smoking too much crack and needs to "get right with Jesus". Pt is calm and cooperative per EMS.

## 2014-11-07 NOTE — BH Assessment (Signed)
Assessment completed. Consulted [name], NP who recommended that pt be assessed by psychiatry. Dr. Preston FleetingGlick has been informed of the recommendation.

## 2014-11-07 NOTE — ED Notes (Signed)
Patient transferred over from TCU.  I introduced myself to patient and he states he remembers me from a previous admission.  He was settled into his room and given a cup of coffee.  Reminded of unit rules.

## 2014-11-07 NOTE — ED Notes (Signed)
Belongings placed in locker #26. 

## 2014-11-07 NOTE — ED Notes (Addendum)
Pt. In burgundy scrubs.pt. And belongings searched and wanded by security. Pt. Has gray/blue tennis shoes, metal ring,1 blue t-shirt, 1 blue long sleeve shirt. Pt. Has 1 belongings bag. Pt. Belongings locked up at the nurses station in the yellow zone. No wallet and no money and no cell phone. Pt. Has no pants and black string in his bag.

## 2014-11-07 NOTE — ED Notes (Addendum)
Pt not found in lobby or immediate outside area when time to come to room for triage.

## 2014-11-07 NOTE — ED Provider Notes (Signed)
TTS has evaluated pt: endorsed SI to them as well as feeling hopeless and helpless, they will seek placement while they re-evaluate him in the morning.   Taylor JesterKathleen Kaiyden Simkin, DO 11/07/14 1558

## 2014-11-07 NOTE — BH Assessment (Signed)
[facility] Assessment Progress Note  The following facilities have been contacted to seek placement for this patient with results as noted:  Beds available, information faxed, decision pending:  [facility]  [facility]  Call rolled to voice mail, left message, awaiting call back:  [place] [facility] [place]  [name], [place] Triage Specialist [phone]

## 2014-11-07 NOTE — ED Provider Notes (Signed)
CSN: [id number]     Arrival date & time 11/07/14  0208 History   First MD Initiated Contact with Patient 11/07/14 0305     Chief Complaint  Patient presents with  . Suicidal     (Consider location/radiation/quality/duration/timing/severity/associated sxs/prior Treatment) The history is provided by the patient.   54 year old male with a long history of paranoid schizophrenia comes in with multiple complaints. He has questions in his feet that are painful, he wants assistance getting in to a group home, he wants a social service consult. He does admit to cocaine use yesterday. He does admit to hearing voices which tell him to kill other people but states that he actually has no homicidal intent. He is complaining of suicidal ideation stating he would jump off a bridge or dermatitis but car. He is also requesting a can of soda.  Past Medical History  Diagnosis Date  . Diabetes mellitus without complication   . Hypertension   . Schizophrenia   . CHF (congestive heart failure)   . Neuropathy   . Polysubstance abuse   . Cocaine abuse   . Homelessness   . Hepatitis C   . Chronic foot pain   . Homelessness    History reviewed. No pertinent past surgical history. Family History  Problem Relation Age of Onset  . Hypertension Other   . Diabetes Other    History  Substance Use Topics  . Smoking status: Current Every Day Smoker -- 1.00 packs/day for 20 years    Types: Cigarettes  . Smokeless tobacco: Current User  . Alcohol Use: Yes     Comment: Pt denies    Review of Systems  All other systems reviewed and are negative.     Allergies  Haldol  Home Medications   Prior to Admission medications   Medication Sig Start Date End Date Taking? Authorizing Provider  ARIPiprazole (ABILIFY) 10 MG tablet Take 1 tablet (10 mg total) by mouth daily. For mood control 11/05/14   Blake Divine, MD  aspirin 81 MG EC tablet Take 1 tablet (81 mg total) by mouth daily. For heart health 08/12/14    Sanjuana Kava, NP  benazepril (LOTENSIN) 5 MG tablet Take 1 tablet (5 mg total) by mouth daily. For high blood pressure 08/12/14   Sanjuana Kava, NP  benztropine (COGENTIN) 2 MG tablet Take 1 tablet (2 mg total) by mouth 2 (two) times daily. For prevention of drug induced tremors 11/05/14   Blake Divine, MD  cloNIDine (CATAPRES) 0.1 MG tablet Take 1 tablet (0.1 mg total) by mouth 2 (two) times daily. For high blood pressure 08/12/14   Sanjuana Kava, NP  fluPHENAZine (PROLIXIN) 5 MG tablet Take 5 mg by mouth 2 (two) times daily.    Historical Provider, MD  furosemide (LASIX) 20 MG tablet Take 1 tablet (20 mg total) by mouth daily. For swellings/high blood pressure 08/12/14   Sanjuana Kava, NP  guaiFENesin (MUCINEX) 600 MG 12 hr tablet Take 2 tablets (1,200 mg total) by mouth 2 (two) times daily. Patient not taking: Reported on 11/03/2014 10/06/14   Arthor Captain, PA-C  metFORMIN (GLUCOPHAGE) 500 MG tablet Take 1 tablet (500 mg total) by mouth daily with breakfast. For high blood sugar control 08/12/14   Sanjuana Kava, NP  tamsulosin (FLOMAX) 0.4 MG CAPS capsule Take 1 capsule (0.4 mg total) by mouth daily after supper. For prostate health 08/12/14   Sanjuana Kava, NP  traZODone (DESYREL) 50 MG tablet Take 1 tablet (  50 mg total) by mouth at bedtime as needed for sleep. 08/12/14   Sanjuana KavaAgnes I Nwoko, NP   BP 152/94 mmHg  Pulse 87  Temp(Src) 98.2 F (36.8 C) (Oral)  Resp 20  SpO2 99% Physical Exam  Nursing note and vitals reviewed.  54 year old male, resting comfortably and in no acute distress. Vital signs are significant for hypertension. Oxygen saturation is 99%, which is normal. Head is normocephalic and atraumatic. PERRLA, EOMI. Oropharynx is clear. Neck is nontender and supple without adenopathy or JVD. Back is nontender and there is no CVA tenderness. Lungs are clear without rales, wheezes, or rhonchi. Chest is nontender. Heart has regular rate and rhythm without murmur. Abdomen is soft, flat,  nontender without masses or hepatosplenomegaly and peristalsis is normoactive. Extremities have no cyanosis or edema, full range of motion is present. Feet have areas of hyperkeratosis but no obvious infection. Skin is warm and dry without rash. Neurologic: Mental status is normal, cranial nerves are intact, there are no motor or sensory deficits. Psychiatric: He exhibits flight of ideas and has rambling speech which is difficult to follow. He is calm and cooperative.  ED Course  Procedures (including critical care time) Labs Review Results for orders placed or performed during the hospital encounter of 11/07/14  Acetaminophen level  Result Value Ref Range   Acetaminophen (Tylenol), Serum <10 (L) 10 - 30 ug/mL  CBC  Result Value Ref Range   WBC 6.9 4.0 - 10.5 K/uL   RBC 4.06 (L) 4.22 - 5.81 MIL/uL   Hemoglobin 11.5 (L) 13.0 - 17.0 g/dL   HCT 62.136.4 (L) 30.839.0 - 65.752.0 %   MCV 89.7 78.0 - 100.0 fL   MCH 28.3 26.0 - 34.0 pg   MCHC 31.6 30.0 - 36.0 g/dL   RDW 84.613.5 96.211.5 - 95.215.5 %   Platelets 168 150 - 400 K/uL  Comprehensive metabolic panel  Result Value Ref Range   Sodium 143 135 - 145 mmol/L   Potassium 3.9 3.5 - 5.1 mmol/L   Chloride 111 101 - 111 mmol/L   CO2 26 22 - 32 mmol/L   Glucose, Bld 143 (H) 70 - 99 mg/dL   BUN 20 6 - 20 mg/dL   Creatinine, Ser 8.411.00 0.61 - 1.24 mg/dL   Calcium 9.5 8.9 - 32.410.3 mg/dL   Total Protein 7.4 6.5 - 8.1 g/dL   Albumin 4.1 3.5 - 5.0 g/dL   AST 31 15 - 41 U/L   ALT 10 (L) 17 - 63 U/L   Alkaline Phosphatase 88 38 - 126 U/L   Total Bilirubin 0.8 0.3 - 1.2 mg/dL   GFR calc non Af Amer >60 >60 mL/min   GFR calc Af Amer >60 >60 mL/min   Anion gap 6 5 - 15  Ethanol (ETOH)  Result Value Ref Range   Alcohol, Ethyl (B) <5 <5 mg/dL  Salicylate level  Result Value Ref Range   Salicylate Lvl <4.0 2.8 - 30.0 mg/dL  Urine Drug Screen  Result Value Ref Range   Opiates NONE DETECTED NONE DETECTED   Cocaine POSITIVE (A) NONE DETECTED   Benzodiazepines NONE  DETECTED NONE DETECTED   Amphetamines NONE DETECTED NONE DETECTED   Tetrahydrocannabinol NONE DETECTED NONE DETECTED   Barbiturates POSITIVE (A) NONE DETECTED     MDM   Final diagnoses:  Chronic schizophrenia    Patient with long-standing history of paranoid schizophrenia appears to be at his baseline as I can recall from prior ED visits. Old records reviewed  and he is a very frequent visitor in the ED and had been seen in just 2 days ago at which time psychiatric evaluation found him at his baseline. Routine screening labs will be obtained and TTS consultation obtained because of his mention of suicidal ideation.    Dione Boozeavid Elyce Zollinger, MD 11/07/14 [phone]

## 2014-11-07 NOTE — ED Notes (Signed)
Pt reports that he is suicidal, denies HI.

## 2014-11-07 NOTE — Progress Notes (Signed)
54 yr old male medicare pt Last seen by this CM in March 2016 and given a list of medicare providers.  Pt states he did not get a pcp since cm spoke with him last Pt states his list of providers is at home.

## 2014-11-07 NOTE — ED Notes (Signed)
Pt refused to go in to triage room and demanded to go to lobby.

## 2014-11-07 NOTE — ED Notes (Signed)
Psychiatry at bedside.

## 2014-11-07 NOTE — BH Assessment (Signed)
Received a call from Tim reporting that pt will need a TTS consult. Will initiate consult.

## 2014-11-07 NOTE — ED Notes (Signed)
Bed: [facility] Expected date:  Expected time:  Means of arrival:  Comments: 80M Psych eval (FW)

## 2014-11-08 DIAGNOSIS — F200 Paranoid schizophrenia: Secondary | ICD-10-CM | POA: Diagnosis not present

## 2014-11-08 LAB — CBG MONITORING, ED: GLUCOSE-CAPILLARY: 137 mg/dL — AB (ref 70–99)

## 2014-11-08 MED ORDER — ACETAMINOPHEN 325 MG PO TABS
650.0000 mg | ORAL_TABLET | Freq: Four times a day (QID) | ORAL | Status: DC | PRN
  Administered 2014-11-08: 650 mg via ORAL
  Filled 2014-11-08: qty 2

## 2014-11-08 MED ORDER — NICOTINE 21 MG/24HR TD PT24: 21.0000 mg | MEDICATED_PATCH | Freq: Once | TRANSDERMAL | Status: DC

## 2014-11-08 NOTE — [facility] Suicide Risk Assessment (Signed)
Suicide Risk Assessment  Discharge Assessment   [facility]   Demographic Factors:  Male  Total Time spent with patient: 45 minutes  Musculoskeletal: Strength & Muscle Tone: within normal limits Gait & Station: normal Patient leans: N/A  Psychiatric Specialty Exam:     Blood pressure 149/95, pulse 64, temperature 97.5 F (36.4 C), temperature source Oral, resp. rate 21, SpO2 99 %.There is no weight on file to calculate BMI.  General Appearance: Casual  Eye Contact::  Good  Speech:  Normal Rate409  Volume:  Normal  Mood:  Anxious  Affect:  Blunt  Thought Process:  Coherent  Orientation:  Full (Time, Place, and Person)  Thought Content:  WDL  Suicidal Thoughts:  No  Homicidal Thoughts:  No  Memory:  Immediate;   Good Recent;   Good Remote;   Good  Judgement:  Fair  Insight:  Fair  Psychomotor Activity:  Normal  Concentration:  Good  Recall:  Good  Fund of Knowledge:Good  Language: Good  Akathisia:  No  Handed:  Right  AIMS (if indicated):     Assets:  Leisure Time Physical Health Resilience Social Support  Sleep:     Cognition: WNL  ADL's:  Intact      Has this patient used any form of tobacco in the last 30 days? (Cigarettes, Smokeless Tobacco, Cigars, and/or Pipes) Yes, A prescription for an FDA-approved tobacco cessation medication was offered at discharge and the patient refused  Mental Status Per Nursing Assessment::   On Admission:   Cocaine abuse  Current Mental Status by Physician: NA  Loss Factors: NA  Historical Factors: NA  Risk Reduction Factors:   Positive therapeutic relationship  Continued Clinical Symptoms:  Anxiety, mild  Cognitive Features That Contribute To Risk:  None    Suicide Risk:  Minimal: No identifiable suicidal ideation.  Patients presenting with no risk factors but with morbid ruminations; may be classified as minimal risk based on the severity of the depressive symptoms  Principal  Problem: Chronic paranoid schizophrenia Discharge Diagnoses:  Patient Active Problem List   Diagnosis Date Noted  . 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 Chronic paranoid schizophrenia [F20.0] 09/07/2014    Priority: High  . Cocaine use disorder, severe, dependence [F14.20]     Priority: High  . 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 Acute CHF (congestive heart failure) [I50.9] 07/29/2014  . Essential hypertension, benign [I10] 03/28/2013  . Diabetes mellitus [E11.9] 03/15/2013      Plan Of Care/Follow-up recommendations:  Activity:  as tolerated Diet:  heart healthy diet  Is patient on multiple antipsychotic therapies at discharge:  No   Has Patient had three or more failed trials of antipsychotic monotherapy by history:  No  Recommended Plan for Multiple Antipsychotic Therapies: NA    Joni Colegrove, PMH-NP 11/08/2014, 10:33 AM

## 2014-11-08 NOTE — Progress Notes (Signed)
Patient psychiatrically stable for discharge home. Pt plans to follow up with [name]. Per patient, pt was set up to have [facility] actt team, however pt did not make it to his follow up appointment. Pt aware he must go during walk in hours and ask for actt team services. Patient requested letter for court.CSW provided pt with copy of letter that he will give to his Arts administratorpublic defender.   Olga CoasterKristen Chamar Broughton, LCSW  Clinical Social Work  [facility] [phone]

## 2014-11-08 NOTE — Consult Note (Signed)
[facility] Consult   Reason for Consult:  Cocaine abuse Referring Physician:  EDP Patient Identification: [name] MRN:  [id number] Principal Diagnosis: Chronic paranoid schizophrenia Diagnosis:   Patient Active Problem List   Diagnosis Date Noted  . 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 Chronic paranoid schizophrenia [F20.0] 09/07/2014    Priority: High  . Cocaine use disorder, severe, dependence [F14.20]     Priority: High  . 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 Acute CHF (congestive heart failure) [I50.9] 07/29/2014  . Essential hypertension, benign [I10] 03/28/2013  . Diabetes mellitus [E11.9] 03/15/2013    Total Time spent with patient: 45 minutes  Subjective:   [name] is a 54 y.o. male patient does not warrant admission.  HPI:  The patient was abusing cocaine, frequent use, and drinking.  Yesterday, he wanted a group home but today stated he has a court date and did not want to go to jail.  [name] is at his baseline with mild anxiety due to legal charges.  A note is provided for court purposes, missing his court date. HPI Elements:   Location:  generalized. Quality:  chronic. Severity:  mild. Timing:  frequent. Duration:  years. Context:  chronic substance abuse.  Past Medical History:  Past Medical History  Diagnosis Date  . Diabetes mellitus without complication   . Hypertension   . Schizophrenia   . CHF (congestive heart failure)   . Neuropathy   . Polysubstance abuse   . Cocaine abuse   . Homelessness   . Hepatitis C   . Chronic foot pain   . Homelessness    History reviewed. No pertinent past surgical history. Family History:  Family History  Problem Relation Age of Onset  . Hypertension Other   . Diabetes Other    Social History:  History  Alcohol Use  . Yes    Comment: Pt denies     History  Drug Use   . 7.00 per week  . Special: "Crack" cocaine, Cocaine, Marijuana    Comment: Cocaine tonight, Marijuana "a long time"    History   Social History  . Marital Status: Divorced    Spouse Name: N/A  . Number of Children: N/A  . Years of Education: N/A   Social History Main Topics  . Smoking status: Current Every Day Smoker -- 1.00 packs/day for 20 years    Types: Cigarettes  . Smokeless tobacco: Current User  . Alcohol Use: Yes     Comment: Pt denies  . Drug Use: 7.00 per week    Special: "Crack" cocaine, Cocaine, Marijuana     Comment: Cocaine tonight, Marijuana "a long time"  . Sexual Activity: No   Other Topics Concern  . None   Social History Narrative   ** Merged History Encounter **       Additional Social History:                          Allergies:   Allergies  Allergen Reactions  . Haldol [Haloperidol] Other (See Comments)    Muscle spasms, loss of voluntary movement. However, pt has taken Thorazine on multiple occasions with no adverse effects.     Labs:  Results for orders placed or performed during the hospital encounter of 11/07/14 (from the past 48 hour(s))  Acetaminophen level     Status: Abnormal   Collection  Time: 11/07/14  3:26 AM  Result Value Ref Range   Acetaminophen (Tylenol), Serum <10 (L) 10 - 30 ug/mL    Comment:        THERAPEUTIC CONCENTRATIONS VARY SIGNIFICANTLY. A RANGE OF 10-30 ug/mL MAY BE AN EFFECTIVE CONCENTRATION FOR MANY PATIENTS. HOWEVER, SOME ARE BEST TREATED AT CONCENTRATIONS OUTSIDE THIS RANGE. ACETAMINOPHEN CONCENTRATIONS >150 ug/mL AT 4 HOURS AFTER INGESTION AND >50 ug/mL AT 12 HOURS AFTER INGESTION ARE OFTEN ASSOCIATED WITH TOXIC REACTIONS.   CBC     Status: Abnormal   Collection Time: 11/07/14  3:26 AM  Result Value Ref Range   WBC 6.9 4.0 - 10.5 K/uL   RBC 4.06 (L) 4.22 - 5.81 MIL/uL   Hemoglobin 11.5 (L) 13.0 - 17.0 g/dL   HCT 19.2 (L) 17.4 - 20.6 %   MCV 89.7 78.0 - 100.0 fL   MCH 28.3 26.0 -  34.0 pg   MCHC 31.6 30.0 - 36.0 g/dL   RDW 61.5 11.1 - 04.5 %   Platelets 168 150 - 400 K/uL  Comprehensive metabolic panel     Status: Abnormal   Collection Time: 11/07/14  3:26 AM  Result Value Ref Range   Sodium 143 135 - 145 mmol/L   Potassium 3.9 3.5 - 5.1 mmol/L   Chloride 111 101 - 111 mmol/L   CO2 26 22 - 32 mmol/L   Glucose, Bld 143 (H) 70 - 99 mg/dL   BUN 20 6 - 20 mg/dL   Creatinine, Ser 1.10 0.61 - 1.24 mg/dL   Calcium 9.5 8.9 - 47.5 mg/dL   Total Protein 7.4 6.5 - 8.1 g/dL   Albumin 4.1 3.5 - 5.0 g/dL   AST 31 15 - 41 U/L   ALT 10 (L) 17 - 63 U/L   Alkaline Phosphatase 88 38 - 126 U/L   Total Bilirubin 0.8 0.3 - 1.2 mg/dL   GFR calc non Af Amer >60 >60 mL/min   GFR calc Af Amer >60 >60 mL/min    Comment: (NOTE) The eGFR has been calculated using the CKD EPI equation. This calculation has not been validated in all clinical situations. eGFR's persistently <60 mL/min signify possible Chronic Kidney Disease.    Anion gap 6 5 - 15  Ethanol (ETOH)     Status: None   Collection Time: 11/07/14  3:26 AM  Result Value Ref Range   Alcohol, Ethyl (B) <5 <5 mg/dL    Comment:        LOWEST DETECTABLE LIMIT FOR SERUM ALCOHOL IS 11 mg/dL FOR MEDICAL PURPOSES ONLY   Salicylate level     Status: None   Collection Time: 11/07/14  3:26 AM  Result Value Ref Range   Salicylate Lvl <4.0 2.8 - 30.0 mg/dL  Urine Drug Screen     Status: Abnormal   Collection Time: 11/07/14  6:59 AM  Result Value Ref Range   Opiates NONE DETECTED NONE DETECTED   Cocaine POSITIVE (A) NONE DETECTED   Benzodiazepines NONE DETECTED NONE DETECTED   Amphetamines NONE DETECTED NONE DETECTED   Tetrahydrocannabinol NONE DETECTED NONE DETECTED   Barbiturates POSITIVE (A) NONE DETECTED    Comment:        DRUG SCREEN FOR MEDICAL PURPOSES ONLY.  IF CONFIRMATION IS NEEDED FOR ANY PURPOSE, NOTIFY LAB WITHIN 5 DAYS.        LOWEST DETECTABLE LIMITS FOR URINE DRUG SCREEN Drug Class       Cutoff  (ng/mL) Amphetamine      1000 Barbiturate  200 Benzodiazepine   354 Tricyclics       562 Opiates          300 Cocaine          300 THC              50   CBG monitoring, ED     Status: Abnormal   Collection Time: 11/08/14  7:56 AM  Result Value Ref Range   Glucose-Capillary 137 (H) 70 - 99 mg/dL    Vitals: Blood pressure 149/95, pulse 64, temperature 97.5 F (36.4 C), temperature source Oral, resp. rate 21, SpO2 99 %.  Risk to Self: Suicidal Ideation: Yes-Currently Present Suicidal Intent: No-Not Currently/Within Last 6 Months Is patient at risk for suicide?: Yes Suicidal Plan?: Yes-Currently Present Specify Current Suicidal Plan: "Bang my head on the wall"  Access to Means: Yes Specify Access to Suicidal Means: Pt has access to wall and can bang his head on any hard object. What has been your use of drugs/alcohol within the last 12 months?: Daily crack/cocaine use reported.  How many times?: 0 Other Self Harm Risks: No other self harm risk identified at this time.  Triggers for Past Attempts: None known Intentional Self Injurious Behavior: None Risk to Others: Homicidal Ideation: No Thoughts of Harm to Others: No-Not Currently Present/Within Last 6 Months Current Homicidal Intent: No-Not Currently/Within Last 6 Months Current Homicidal Plan: No-Not Currently/Within Last 6 Months Access to Homicidal Means: No Identified Victim: NA History of harm to others?: No Assessment of Violence: On admission Violent Behavior Description: No violent behaviors observed. Pt is calm and cooperative at this time.  Does patient have access to weapons?: No Criminal Charges Pending?: Yes Describe Pending Criminal Charges: Panhandling  Does patient have a court date: Yes Court Date: 11/15/14 Prior Inpatient Therapy: Prior Inpatient Therapy: Yes Prior Therapy Dates: Multiple Prior Therapy Facilty/Provider(s): Multiple Reason for Treatment: Psychosis Prior Outpatient Therapy: Prior  Outpatient Therapy: Yes Prior Therapy Dates: Multiple Prior Therapy Facilty/Provider(s): none currently, [name] recently  Reason for Treatment: med mgmt Does patient have an ACCT team?: No Does patient have Intensive In-House Services?  : No Does patient have [name] services? : No Does patient have [facility] services?: No  Current Facility-Administered Medications  Medication Dose Route Frequency Provider Last Rate Last Dose  . acetaminophen (TYLENOL) tablet 650 mg  650 mg Oral Q6H PRN Kristen N Ward, DO   650 mg at 11/08/14 0815  . amantadine (SYMMETREL) capsule 100 mg  100 mg Oral BID Delfin Gant, NP   100 mg at 11/08/14 0939  . ARIPiprazole (ABILIFY) tablet 10 mg  10 mg Oral Daily Francine Graven, DO   10 mg at 11/08/14 5638  . aspirin EC tablet 81 mg  81 mg Oral Daily Francine Graven, DO   81 mg at 11/08/14 0940  . benazepril (LOTENSIN) tablet 5 mg  5 mg Oral Daily Francine Graven, DO   5 mg at 11/08/14 0940  . benztropine (COGENTIN) tablet 2 mg  2 mg Oral BID Francine Graven, DO   2 mg at 11/08/14 0940  . cloNIDine (CATAPRES) tablet 0.1 mg  0.1 mg Oral BID Francine Graven, DO   0.1 mg at 11/08/14 0940  . furosemide (LASIX) tablet 20 mg  20 mg Oral Daily Francine Graven, DO   20 mg at 11/08/14 0940  . metFORMIN (GLUCOPHAGE) tablet 500 mg  500 mg Oral Q breakfast Francine Graven, DO   500 mg at 11/08/14 0815  . nicotine (NICODERM  CQ - dosed in mg/24 hours) patch 21 mg  21 mg Transdermal Once Omair Dettmer      . tamsulosin (FLOMAX) capsule 0.4 mg  0.4 mg Oral QPC supper Francine Graven, DO   0.4 mg at 11/07/14 1815  . traZODone (DESYREL) tablet 50 mg  50 mg Oral QHS PRN Francine Graven, DO   50 mg at 11/08/14 0147   Current Outpatient Prescriptions  Medication Sig Dispense Refill  . ARIPiprazole (ABILIFY) 10 MG tablet Take 1 tablet (10 mg total) by mouth daily. For mood control 14 tablet 0  . aspirin 81 MG EC tablet Take 1 tablet (81 mg total) by mouth daily. For heart  health 30 tablet 3  . benazepril (LOTENSIN) 5 MG tablet Take 1 tablet (5 mg total) by mouth daily. For high blood pressure 30 tablet 0  . benztropine (COGENTIN) 2 MG tablet Take 1 tablet (2 mg total) by mouth 2 (two) times daily. For prevention of drug induced tremors 28 tablet 0  . cloNIDine (CATAPRES) 0.1 MG tablet Take 1 tablet (0.1 mg total) by mouth 2 (two) times daily. For high blood pressure 60 tablet 0  . furosemide (LASIX) 20 MG tablet Take 1 tablet (20 mg total) by mouth daily. For swellings/high blood pressure 30 tablet 3  . guaiFENesin (MUCINEX) 600 MG 12 hr tablet Take 2 tablets (1,200 mg total) by mouth 2 (two) times daily. (Patient not taking: Reported on 11/03/2014) 30 tablet 0  . metFORMIN (GLUCOPHAGE) 500 MG tablet Take 1 tablet (500 mg total) by mouth daily with breakfast. For high blood sugar control 30 tablet 0  . tamsulosin (FLOMAX) 0.4 MG CAPS capsule Take 1 capsule (0.4 mg total) by mouth daily after supper. For prostate health 30 capsule 0  . traZODone (DESYREL) 50 MG tablet Take 1 tablet (50 mg total) by mouth at bedtime as needed for sleep. 30 tablet 0   Musculoskeletal: Strength & Muscle Tone: within normal limits Gait & Station: normal Patient leans: N/A  Psychiatric Specialty Exam:     Blood pressure 149/95, pulse 64, temperature 97.5 F (36.4 C), temperature source Oral, resp. rate 21, SpO2 99 %.There is no weight on file to calculate BMI.  General Appearance: Casual  Eye Contact::  Good  Speech:  Normal Rate409  Volume:  Normal  Mood:  Anxious  Affect:  Blunt  Thought Process:  Coherent  Orientation:  Full (Time, Place, and Person)  Thought Content:  WDL  Suicidal Thoughts:  No  Homicidal Thoughts:  No  Memory:  Immediate;   Good Recent;   Good Remote;   Good  Judgement:  Fair  Insight:  Fair  Psychomotor Activity:  Normal  Concentration:  Good  Recall:  Good  Fund of Knowledge:Good  Language: Good  Akathisia:  No  Handed:  Right  AIMS (if  indicated):     Assets:  Leisure Time Physical Health Resilience Social Support  Sleep:     Cognition: WNL  ADL's:  Intact    Medical Decision Making: Review of Psycho-Social Stressors (1), Review or order clinical lab tests (1) and Review of Medication Regimen & Side Effects (2)  Treatment Plan Summary: Daily contact with patient to assess and evaluate symptoms and progress in treatment, Medication management and Plan discharge home, follow-up with his ACT team  Plan:  No evidence of imminent risk to self or others at present.   Disposition: discharge home, follow-up with his ACT team  Waylan Boga, Mazie 11/08/2014 10:36 AM Patient  seen face-to-face for psychiatric evaluation, chart reviewed and case discussed with the physician extender and developed treatment plan. Reviewed the information documented and agree with the treatment plan. Corena Pilgrim, MD

## 2014-11-13 ENCOUNTER — Emergency Department (HOSPITAL_COMMUNITY)
Admission: EM | Admit: 2014-11-13 | Discharge: 2014-11-14 | Disposition: A | Discharge status: Home / Self Care | Payer: Medicare Other | Attending: Emergency Medicine | Admitting: Emergency Medicine

## 2014-11-13 DIAGNOSIS — R45851 Suicidal ideations: Secondary | ICD-10-CM

## 2014-11-13 DIAGNOSIS — F419 Anxiety disorder, unspecified: Secondary | ICD-10-CM | POA: Insufficient documentation

## 2014-11-13 DIAGNOSIS — Z720 Tobacco use: Secondary | ICD-10-CM | POA: Insufficient documentation

## 2014-11-13 DIAGNOSIS — F1910 Other psychoactive substance abuse, uncomplicated: Secondary | ICD-10-CM

## 2014-11-13 DIAGNOSIS — Z8619 Personal history of other infectious and parasitic diseases: Secondary | ICD-10-CM | POA: Insufficient documentation

## 2014-11-13 DIAGNOSIS — E119 Type 2 diabetes mellitus without complications: Secondary | ICD-10-CM | POA: Insufficient documentation

## 2014-11-13 DIAGNOSIS — F1420 Cocaine dependence, uncomplicated: Secondary | ICD-10-CM | POA: Diagnosis present

## 2014-11-13 DIAGNOSIS — F1410 Cocaine abuse, uncomplicated: Secondary | ICD-10-CM | POA: Insufficient documentation

## 2014-11-13 DIAGNOSIS — Z79899 Other long term (current) drug therapy: Secondary | ICD-10-CM | POA: Insufficient documentation

## 2014-11-13 DIAGNOSIS — Z7982 Long term (current) use of aspirin: Secondary | ICD-10-CM | POA: Insufficient documentation

## 2014-11-13 DIAGNOSIS — G8929 Other chronic pain: Secondary | ICD-10-CM | POA: Insufficient documentation

## 2014-11-13 DIAGNOSIS — I10 Essential (primary) hypertension: Secondary | ICD-10-CM | POA: Insufficient documentation

## 2014-11-13 DIAGNOSIS — Z590 Homelessness: Secondary | ICD-10-CM | POA: Insufficient documentation

## 2014-11-13 DIAGNOSIS — I509 Heart failure, unspecified: Secondary | ICD-10-CM | POA: Insufficient documentation

## 2014-11-13 DIAGNOSIS — F209 Schizophrenia, unspecified: Secondary | ICD-10-CM | POA: Insufficient documentation

## 2014-11-13 LAB — CBC
HCT: 39.7 % (ref 39.0–52.0)
Hemoglobin: 12.7 g/dL — ABNORMAL LOW (ref 13.0–17.0)
MCH: 28.8 pg (ref 26.0–34.0)
MCHC: 32 g/dL (ref 30.0–36.0)
MCV: 90 fL (ref 78.0–100.0)
Platelets: 199 10*3/uL (ref 150–400)
RBC: 4.41 MIL/uL (ref 4.22–5.81)
RDW: 14.3 % (ref 11.5–15.5)
WBC: 6.2 10*3/uL (ref 4.0–10.5)

## 2014-11-13 LAB — COMPREHENSIVE METABOLIC PANEL WITH GFR
ALT: 10 U/L — ABNORMAL LOW (ref 17–63)
AST: 21 U/L (ref 15–41)
Albumin: 4.2 g/dL (ref 3.5–5.0)
Alkaline Phosphatase: 90 U/L (ref 38–126)
Anion gap: 9 (ref 5–15)
BUN: 12 mg/dL (ref 6–20)
CO2: 28 mmol/L (ref 22–32)
Calcium: 9.7 mg/dL (ref 8.9–10.3)
Chloride: 105 mmol/L (ref 101–111)
Creatinine, Ser: 0.91 mg/dL (ref 0.61–1.24)
GFR calc Af Amer: >60 mL/min
GFR calc non Af Amer: >60 mL/min
Glucose, Bld: 140 mg/dL — ABNORMAL HIGH (ref 65–99)
Potassium: 3.3 mmol/L — ABNORMAL LOW (ref 3.5–5.1)
Sodium: 142 mmol/L (ref 135–145)
Total Bilirubin: 0.8 mg/dL (ref 0.3–1.2)
Total Protein: 7.7 g/dL (ref 6.5–8.1)

## 2014-11-13 LAB — RAPID URINE DRUG SCREEN, HOSP PERFORMED
Amphetamines: NOT DETECTED
Barbiturates: NOT DETECTED
Benzodiazepines: NOT DETECTED
Cocaine: POSITIVE — AB
Opiates: NOT DETECTED
Tetrahydrocannabinol: NOT DETECTED

## 2014-11-13 LAB — ACETAMINOPHEN LEVEL: Acetaminophen (Tylenol), Serum: <10 ug/mL — ABNORMAL LOW (ref 10–30)

## 2014-11-13 LAB — ETHANOL: Alcohol, Ethyl (B): <5 mg/dL (ref <5)

## 2014-11-13 LAB — SALICYLATE LEVEL

## 2014-11-13 MED ORDER — IBUPROFEN 400 MG PO TABS: 600.0000 mg | ORAL_TABLET | Freq: Three times a day (TID) | ORAL | Status: DC | PRN

## 2014-11-13 MED ORDER — ACETAMINOPHEN 325 MG PO TABS
650.0000 mg | ORAL_TABLET | ORAL | Status: DC | PRN
  Administered 2014-11-13 (×2): 650 mg via ORAL
  Filled 2014-11-13 (×2): qty 2

## 2014-11-13 MED ORDER — BENZTROPINE MESYLATE 1 MG PO TABS
2.0000 mg | ORAL_TABLET | Freq: Two times a day (BID) | ORAL | Status: DC
  Administered 2014-11-13 (×2): 2 mg via ORAL
  Filled 2014-11-13 (×2): qty 2

## 2014-11-13 MED ORDER — ARIPIPRAZOLE 10 MG PO TABS
10.0000 mg | ORAL_TABLET | Freq: Every day | ORAL | Status: DC
  Administered 2014-11-13: 10 mg via ORAL
  Filled 2014-11-13: qty 1

## 2014-11-13 MED ORDER — TRAZODONE HCL 50 MG PO TABS: 50.0000 mg | ORAL_TABLET | Freq: Every evening | ORAL | Status: DC | PRN

## 2014-11-13 MED ORDER — METFORMIN HCL 500 MG PO TABS
500.0000 mg | ORAL_TABLET | Freq: Every day | ORAL | Status: DC
  Administered 2014-11-14: 500 mg via ORAL
  Filled 2014-11-13: qty 1

## 2014-11-13 MED ORDER — CLONIDINE HCL 0.1 MG PO TABS
0.1000 mg | ORAL_TABLET | Freq: Two times a day (BID) | ORAL | Status: DC
  Administered 2014-11-13 (×2): 0.1 mg via ORAL
  Filled 2014-11-13 (×2): qty 1

## 2014-11-13 MED ORDER — DIPHENHYDRAMINE HCL 25 MG PO CAPS
25.0000 mg | ORAL_CAPSULE | Freq: Once | ORAL | Status: AC
  Administered 2014-11-13: 25 mg via ORAL
  Filled 2014-11-13: qty 1

## 2014-11-13 MED ORDER — ASPIRIN EC 81 MG PO TBEC
81.0000 mg | DELAYED_RELEASE_TABLET | Freq: Every day | ORAL | Status: DC
  Administered 2014-11-13: 81 mg via ORAL
  Filled 2014-11-13: qty 1

## 2014-11-13 MED ORDER — ONDANSETRON HCL 4 MG PO TABS: 4.0000 mg | ORAL_TABLET | Freq: Three times a day (TID) | ORAL | Status: DC | PRN

## 2014-11-13 MED ORDER — TAMSULOSIN HCL 0.4 MG PO CAPS
0.4000 mg | ORAL_CAPSULE | Freq: Every day | ORAL | Status: DC
  Administered 2014-11-13: 0.4 mg via ORAL
  Filled 2014-11-13: qty 1

## 2014-11-13 MED ORDER — ALUM & MAG HYDROXIDE-SIMETH 200-200-20 MG/5ML PO SUSP: 30.0000 mL | ORAL | Status: DC | PRN

## 2014-11-13 MED ORDER — BENAZEPRIL HCL 5 MG PO TABS
5.0000 mg | ORAL_TABLET | Freq: Every day | ORAL | Status: DC
  Administered 2014-11-13: 5 mg via ORAL
  Filled 2014-11-13 (×2): qty 1

## 2014-11-13 MED ORDER — FUROSEMIDE 20 MG PO TABS
20.0000 mg | ORAL_TABLET | Freq: Every day | ORAL | Status: DC
  Administered 2014-11-13: 20 mg via ORAL
  Filled 2014-11-13: qty 1

## 2014-11-13 MED ORDER — LORAZEPAM 1 MG PO TABS: 1.0000 mg | ORAL_TABLET | Freq: Three times a day (TID) | ORAL | Status: DC | PRN

## 2014-11-13 NOTE — BH Assessment (Addendum)
Tele Assessment Note   [name] is an 54 y.o. male.  -Clinician talked with Dr. Wilkie AyeHorton to discuss need for TTS.  Patient has been seeing snakes and thinks that family members are trying to kill him to get insurance money.  Talks about wanting to get hit by a car to die.  Patient is very manic.  He moves about on the bed and gesticulates a lot.  He has rapid speech and is very tangential.  Patient is seeing snakes in the couch of the house he lives in.  He says he stays in a relative's house and he believes that they are plotting against him.  Patient says that he thinks that his father and a cousin have taken insurance policies out on him and are conspiring to kill him.  Patient says he wants to kill himself by cutting his head off.  He denies any auditory hallucinations and no HI.  Patient has a hx of chronic mental illness.  He is non-compliant with medications although now he says that he needs to be back on his Abilify.  Pt perseverates on why he cannot attract a male.  He talks about how he has not had sex in a long time.  Patient cannot understand why anyone would not find him attractive.  Patient says that he uses crack cocaine.  He is not looking to stop using it however.  Patient cannot say when the last time he went to a psychiatrist was.  Patient is a poor historian.  He did say he went to [facility] about three months ago.  -Clinician discussed patient care with [name], NP.  She recommends inpatient psychiatric care.  Patient disposition shared with Dr. Wilkie AyeHorton.  [facility].  Axis I: Schizoaffective Disorder and Substance Abuse Axis II: Deferred Axis III:  Past Medical History  Diagnosis Date  . Diabetes mellitus without complication   . Hypertension   . Schizophrenia   . CHF (congestive heart failure)   . Neuropathy   . Polysubstance abuse   . Cocaine abuse   . Homelessness   . Hepatitis C   . Chronic foot  pain   . Homelessness    Axis IV: economic problems, housing problems, other psychosocial or environmental problems, problems related to legal system/crime and problems with primary support group Axis V: 31-40 impairment in reality testing  Past Medical History:  Past Medical History  Diagnosis Date  . Diabetes mellitus without complication   . Hypertension   . Schizophrenia   . CHF (congestive heart failure)   . Neuropathy   . Polysubstance abuse   . Cocaine abuse   . Homelessness   . Hepatitis C   . Chronic foot pain   . Homelessness     No past surgical history on file.  Family History:  Family History  Problem Relation Age of Onset  . Hypertension Other   . Diabetes Other     Social History:  reports that he has been smoking Cigarettes.  He has a 20 pack-year smoking history. He uses smokeless tobacco. He reports that he drinks alcohol. He reports that he uses illicit drugs ("Crack" cocaine, Cocaine, and Marijuana) about 7 times per week.  Additional Social History:     CIWA: CIWA-Ar BP: 176/81 mmHg Pulse Rate: 77 COWS:    PATIENT STRENGTHS: (choose at least two) Average or above average intelligence Capable of independent living  Allergies:  Allergies  Allergen Reactions  . Haldol [Haloperidol] Other (See Comments)    Muscle spasms, loss of voluntary movement. However, pt has taken Thorazine on multiple occasions with no adverse effects.     Home Medications:  (Not in a hospital admission)  OB/GYN Status:  No LMP for male patient.  General Assessment Data [facility] TTS Assessment: In system Is this a Tele or Face-to-Face Assessment?: Tele Assessment Is this an Initial Assessment or a Re-assessment for this encounter?: Initial Assessment Marital status: Single Is patient pregnant?: No Pregnancy Status: No Living Arrangements: Alone (Lives in a family home.) Can pt return to current living arrangement?: Yes Admission Status:  Voluntary Is patient capable of signing voluntary admission?: Yes Referral Source: Self/Family/Friend [facility]     Crisis Care Plan Living Arrangements: Alone (Lives in a family home.) Name of Psychiatrist: None currently Name of Therapist: No provider  Education Status Highest grade of school patient has completed: 12 grade  Risk to self with the past 6 months Suicidal Ideation: Yes-Currently Present Has patient been a risk to self within the past 6 months prior to admission? : Yes Suicidal Intent: No-Not Currently/Within Last 6 Months Has patient had any suicidal intent within the past 6 months prior to admission? : Yes Is patient at risk for suicide?: Yes Suicidal Plan?: Yes-Currently Present Has patient had any suicidal plan within the past 6 months prior to admission? : Yes Specify Current Suicidal Plan: Cut his head off Access to Means: Yes Specify Access to Suicidal Means: Sharps What has been your use of drugs/alcohol within the last 12 months?: Cocaine (crack) Previous Attempts/Gestures: No How many times?: 0 Other Self Harm Risks: None other Triggers for Past Attempts: None known Intentional Self Injurious Behavior: None Comment - Self Injurious Behavior: None Family Suicide History: No Recent stressful life event(s): Legal Issues, Other (Comment) (Pt has been off medication (abilify) four months) Persecutory voices/beliefs?: Yes Depression: Yes Depression Symptoms: Despondent, Feeling worthless/self pity, Loss of interest in usual pleasures Substance abuse history and/or treatment for substance abuse?: Yes Suicide prevention information given to non-admitted patients: Not applicable  Risk to Others within the past 6 months Homicidal Ideation: No Does patient have any lifetime risk of violence toward others beyond the six months prior to admission? : No Thoughts of Harm to Others: Yes-Currently Present Comment - Thoughts of Harm to Others: Wants to  hurt someone (if they hurt him) Current Homicidal Intent: No Current Homicidal Plan: No Describe Current Homicidal Plan: None Access to Homicidal Means: No Describe Access to Homicidal Means: None Identified Victim: No one History of harm to others?: No Assessment of Violence: In distant past Violent Behavior Description: Pt says that he has not gotten into a fight Does patient have access to weapons?: Yes (Comment) Criminal Charges Pending?: Yes Describe Pending Criminal Charges: cocaine poession Does patient have a court date: Yes Court Date:  (Does not know) Is patient on probation?: No  Psychosis Hallucinations: Visual (Snakes on his couch at home) Delusions: Persecutory, Grandiose  Mental Status Report Appearance/Hygiene: Disheveled, In scrubs, Body odor Eye Contact: Good Motor Activity: Freedom of movement, Restlessness, Unremarkable Speech: Incoherent, Tangential, Pressured Level of Consciousness: Alert Mood: Anxious, Preoccupied Affect: Silly Anxiety Level: Severe Thought Processes: Irrelevant, Tangential, Flight of Ideas Judgement: Unimpaired Orientation: Appropriate for developmental age Obsessive Compulsive Thoughts/Behaviors: Minimal  Cognitive Functioning Concentration: Decreased Memory: Recent Impaired, Remote Impaired IQ: Average Insight: Poor Impulse Control: Poor Appetite: Good Weight Loss: 0 Weight Gain: 0 Sleep:  Decreased Total Hours of Sleep:  (<4H/D) Vegetative Symptoms: None  ADLScreening [facility] Assessment Services) Patient's cognitive ability adequate to safely complete daily activities?: Yes Patient able to express need for assistance with ADLs?: Yes Independently performs ADLs?: Yes (appropriate for developmental age)  Prior Inpatient Therapy Prior Inpatient Therapy: Yes Prior Therapy Dates: Multiple Prior Therapy Facilty/Provider(s): Multiple Reason for Treatment: Psychosis  Prior Outpatient Therapy Prior Outpatient Therapy:  Yes Prior Therapy Dates: Multiple Prior Therapy Facilty/Provider(s): none currently, [name] recently  Reason for Treatment: med mgmt Does patient have an ACCT team?: No Does patient have Intensive In-House Services?  : No Does patient have [name] services? : No Does patient have P4CC services?: No  ADL Screening (condition at time of admission) Patient's cognitive ability adequate to safely complete daily activities?: Yes Is the patient deaf or have difficulty hearing?: No Does the patient have difficulty seeing, even when wearing glasses/contacts?: No Does the patient have difficulty concentrating, remembering, or making decisions?: Yes Patient able to express need for assistance with ADLs?: Yes Does the patient have difficulty dressing or bathing?: No Independently performs ADLs?: Yes (appropriate for developmental age) Does the patient have difficulty walking or climbing stairs?: No Weakness of Legs: None Weakness of Arms/Hands: None       Abuse/Neglect Assessment (Assessment to be complete while patient is alone) Physical Abuse: Denies Verbal Abuse: Denies Sexual Abuse: Denies Exploitation of patient/patient's resources: Denies Self-Neglect: Denies     Merchant navy officer (For Healthcare) Does patient have an advance directive?: No Would patient like information on creating an advanced directive?: No - patient declined information    Additional Information 1:1 In Past 12 Months?: No CIRT Risk: No Elopement Risk: No Does patient have medical clearance?: Yes     Disposition:  Disposition Initial Assessment Completed for this Encounter: Yes Disposition of Patient: Other dispositions Other disposition(s): Other (Comment) (Pt to be reviewed with nurse practitioner)  Beatriz Stallion Ray 11/13/2014 3:50 AM

## 2014-11-13 NOTE — Progress Notes (Signed)
CSW contacted all local inpatient psych facilities and no beds were available, but the following facilities accepted a referral for the wait list:  [facility] (Later Denied)  CSW is available as needed to assist with placement.  Seward SpeckLeo Lebaron Bautch [facility] For OrthopaedicsCSW,LCAS Behavioral Health Disposition CSW [phone]

## 2014-11-13 NOTE — ED Provider Notes (Signed)
CSN: [id number]     Arrival date & time 11/13/14  0020 History  This chart was scribed for Shon Batonourtney F Psalms Olarte, MD by Freida Busmaniana Omoyeni, ED Scribe. This patient was seen in room D36C/D36C and the patient's care was started 2:41 AM.    Chief Complaint  Patient presents with  . Suicidal  Level 5 Caveat due to psychiatric disorder  The history is provided by the patient. No language interpreter was used.     HPI Comments:  [name] is a 54 y.o. male with a h/o schizophrenia and chronic foot pain who presents to the Emergency Department complaining of BLE pain. He reports a h/o neuropathy and notes he stands on feet all day. Pt also complains of racing thoughts, paranoia, visual hallucinations of apes and snakes and SI with a  plan to get hit by a car. Pt notes he is currently out of his abilify and has had worsening symptoms since off his meds. He admits to ETOH abuse and daily cocaine use. No alleviating factors noted. Pt is an unreliable historian. He is rambling and difficult to direct during history taking. Of note, he gave a much different story to nursing.   Past Medical History  Diagnosis Date  . Diabetes mellitus without complication   . Hypertension   . Schizophrenia   . CHF (congestive heart failure)   . Neuropathy   . Polysubstance abuse   . Cocaine abuse   . Homelessness   . Hepatitis C   . Chronic foot pain   . Homelessness    No past surgical history on file. Family History  Problem Relation Age of Onset  . Hypertension Other   . Diabetes Other    History  Substance Use Topics  . Smoking status: Current Every Day Smoker -- 1.00 packs/day for 20 years    Types: Cigarettes  . Smokeless tobacco: Current User  . Alcohol Use: Yes     Comment: Pt denies    Review of Systems  Unable to perform ROS: Psychiatric disorder  Constitutional: Negative.  Negative for fever.  Respiratory: Negative.  Negative for chest tightness and shortness of breath.    Cardiovascular: Negative.  Negative for chest pain.  Gastrointestinal: Negative.  Negative for abdominal pain.  Genitourinary: Negative.   Musculoskeletal: Negative for back pain.       Foot pain  Skin: Negative for rash.  Psychiatric/Behavioral: Positive for hallucinations, self-injury and agitation. The patient is nervous/anxious.   All other systems reviewed and are negative.     Allergies  Haldol  Home Medications   Prior to Admission medications   Medication Sig Start Date End Date Taking? Authorizing Provider  ARIPiprazole (ABILIFY) 10 MG tablet Take 1 tablet (10 mg total) by mouth daily. For mood control 11/05/14   Blake DivineJohn Wofford, MD  aspirin 81 MG EC tablet Take 1 tablet (81 mg total) by mouth daily. For heart health 08/12/14   Sanjuana KavaAgnes I Nwoko, NP  benazepril (LOTENSIN) 5 MG tablet Take 1 tablet (5 mg total) by mouth daily. For high blood pressure 08/12/14   Sanjuana KavaAgnes I Nwoko, NP  benztropine (COGENTIN) 2 MG tablet Take 1 tablet (2 mg total) by mouth 2 (two) times daily. For prevention of drug induced tremors 11/05/14   Blake DivineJohn Wofford, MD  cloNIDine (CATAPRES) 0.1 MG tablet Take 1 tablet (0.1 mg total) by mouth 2 (two) times daily. For high blood pressure 08/12/14   Sanjuana KavaAgnes I Nwoko, NP  furosemide (LASIX) 20 MG tablet Take 1  tablet (20 mg total) by mouth daily. For swellings/high blood pressure 08/12/14   Sanjuana Kava, NP  guaiFENesin (MUCINEX) 600 MG 12 hr tablet Take 2 tablets (1,200 mg total) by mouth 2 (two) times daily. Patient not taking: Reported on 11/03/2014 10/06/14   Arthor Captain, PA-C  metFORMIN (GLUCOPHAGE) 500 MG tablet Take 1 tablet (500 mg total) by mouth daily with breakfast. For high blood sugar control 08/12/14   Sanjuana Kava, NP  tamsulosin (FLOMAX) 0.4 MG CAPS capsule Take 1 capsule (0.4 mg total) by mouth daily after supper. For prostate health 08/12/14   Sanjuana Kava, NP  traZODone (DESYREL) 50 MG tablet Take 1 tablet (50 mg total) by mouth at bedtime as needed for sleep.  08/12/14   Sanjuana Kava, NP   BP 176/81 mmHg  Pulse 77  Temp(Src) 97.8 F (36.6 C) (Oral)  Resp 18  Ht  (1.753 m)  Wt 165 lb (74.844 kg)  BMI 24.36 kg/m2  SpO2 99% Physical Exam  Constitutional: He is oriented to person, place, and time. He appears well-developed and well-nourished.  Agitated  HENT:  Head: Normocephalic and atraumatic.  Cardiovascular: Normal rate and regular rhythm.   Pulmonary/Chest: Effort normal. No respiratory distress.  Musculoskeletal: He exhibits no edema.  Bilateral feet dry, multiple bunions noted, no erythema or open sores  Lymphadenopathy:    He has no cervical adenopathy.  Neurological: He is alert and oriented to person, place, and time.  Skin: Skin is warm and dry.  Psychiatric:  Tangential and pressured speech, difficult to direct, endorses suicidal ideation  Nursing note and vitals reviewed.   ED Course  Procedures   DIAGNOSTIC STUDIES:  Oxygen Saturation is 99% on RA, normal by my interpretation.    COORDINATION OF CARE:  2:43 AM Will have psych speak with patient. Discussed treatment plan with pt at bedside and pt agreed to plan. At this time pt is requesting tylenol, graham crackers and a foot bath.    Labs Review Labs Reviewed  ACETAMINOPHEN LEVEL - Abnormal; Notable for the following:    Acetaminophen (Tylenol), Serum <10 (*)    All other components within normal limits  CBC - Abnormal; Notable for the following:    Hemoglobin 12.7 (*)    All other components within normal limits  COMPREHENSIVE METABOLIC PANEL - Abnormal; Notable for the following:    Potassium 3.3 (*)    Glucose, Bld 140 (*)    ALT 10 (*)    All other components within normal limits  URINE RAPID DRUG SCREEN (HOSP PERFORMED) - Abnormal; Notable for the following:    Cocaine POSITIVE (*)    All other components within normal limits  ETHANOL  SALICYLATE LEVEL    Imaging Review No results found.   EKG Interpretation   Date/Time:  Sunday Nov 13 2014 00:23:52 EDT Ventricular Rate:  72 PR Interval:  138 QRS Duration: 92 QT Interval:  400 QTC Calculation: 438 R Axis:   58 Text Interpretation:  Normal sinus rhythm with sinus arrhythmia Septal  infarct , age undetermined T wave abnormality, consider lateral ischemia  Abnormal ECG When compared with ECG of 10/13/2014, No significant change  was found Confirmed by [facility]  MD, DAVID ([id number]) on 11/13/2014 12:28:18 AM      MDM   Final diagnoses:  None    Patient with history of schizophrenia off his medications. History of pressured and difficult to direct during history taking. Reports SI with plan.  Basic labwork  obtained. TTS consulted. Recommend inpatient placement.  Awaiting placement.  I personally performed the services described in this documentation, which was scribed in my presence. The recorded information has been reviewed and is accurate.    Shon Batonourtney F Merlin Golden, MD 11/13/14 [phone]

## 2014-11-13 NOTE — ED Notes (Signed)
Dr Wilkie AyeHorton aware pt's home meds need to be re-ordered.

## 2014-11-13 NOTE — ED Notes (Signed)
Food given 

## 2014-11-13 NOTE — ED Notes (Signed)
Presents with multiple complaints-main focus at this time is unable to find a woman to have sex with-endorses crack daily-states "i am a 3 headed werewolf, I feel like I may hurt myself or someone else. Women won't give me chance in this world. I am mad and furious and I am heart broken sweetheart. Come to find out I am about ready to die, pretty soon I am going to hurt myself because I feel like nobody cares. I feel like I am going to hurt myself. My heart is confgested. I donm't want to sped the rest of my life in prison. I don't have anywhere to go. I masterbate to much. I had all this money and I spend it on crack, but I want sex and I can't get it from theses girls, but they fell in love some beast and some monster and all the virgins have been rav\ished. I don't have a chance at one woman, on fresh woman virgin-the =enemies don't want me to have shit" paranoia, transient thought process.

## 2014-11-13 NOTE — ED Notes (Signed)
Pt is requesting something to help him sleep. Horton, MD notified. Orders to follow.

## 2014-11-13 NOTE — ED Notes (Signed)
Unable to get temp due to pt having just had cold drink

## 2014-11-13 NOTE — ED Notes (Signed)
Sitter at the bedside.

## 2014-11-14 DIAGNOSIS — F1420 Cocaine dependence, uncomplicated: Secondary | ICD-10-CM | POA: Diagnosis not present

## 2014-11-14 LAB — CBG MONITORING, ED: Glucose-Capillary: 121 mg/dL — ABNORMAL HIGH (ref 65–99)

## 2014-11-14 MED ORDER — POTASSIUM CHLORIDE CRYS ER 20 MEQ PO TBCR
40.0000 meq | EXTENDED_RELEASE_TABLET | Freq: Once | ORAL | Status: AC
  Administered 2014-11-14: 40 meq via ORAL
  Filled 2014-11-14: qty 2

## 2014-11-14 NOTE — ED Notes (Addendum)
Pt aware of belongings with security. Pt sts he does not want his belongings and wants to go ahead and be discharged. Pt received clothing and signed papers Pt received bus pass, discharge papers and [facility] information.

## 2014-11-14 NOTE — ED Notes (Signed)
[facility]

## 2014-11-14 NOTE — Discharge Instructions (Signed)
[facility] to [facility] or any of there other agencies listed to get help with your drug problem. If you have any thought of harming yourself or someone else, call 911 immediately

## 2014-11-14 NOTE — Consult Note (Signed)
Telepsych Consultation   Reason for Consult:  Cocaine use disorder, severe, Paranoia Referring Physician:  EDP Patient Identification: [name] MRN:  [id number] Principal Diagnosis: Cocaine use disorder, severe, dependence Diagnosis:   Patient Active Problem List   Diagnosis Date Noted  . 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 Cocaine use disorder, severe, dependence [F14.20]     Priority: High  . 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 Chronic paranoid schizophrenia [F20.0] 09/07/2014  . Acute CHF (congestive heart failure) [I50.9] 07/29/2014  . Essential hypertension, benign [I10] 03/28/2013  . Diabetes mellitus [E11.9] 03/15/2013    Total Time spent with patient: 45 minutes  Subjective:   [name] is a 54 y.o. male patient admitted with Cocaine use disorder, severe, Paranoia.  HPI:  [name], 54 years old was admitted to [facility] hospital ER for Cocaine intoxication and Paranoia.  Patient is well known to the service and was frequents the ER for Cocaine abuse and intoxication.  He also has a diagnosis of Schizoaffective disorder of which he does not take medications and does not see a Psychiatrist.  He reports that the reason for this visit was because he felt his family members were going to kill him and go after his life insurance.  He stated that he did not feel safe around his family members.  He is homeless and stays on the corner of the streets asking for money.  He reports that his family members are not helping him out to stop his life style.  He denies SI/HI/AVH.  He is no longer feeling paranoid towards his family members because his cousin Duwaine Maxin has promised to take him in and help him with his mental help. Writer spoke with [name] who states that she is willing to help [name] receive care but requested substance abuse care.  We will discharge  patient to follow up at [facility] as has been done several times and we will also give him information for substance abuse rehabilitation centers in the community.   HPI Elements:   Location:  Cocaine use disorder, severe, Paranoia, Schizo affective disorder, by hx.. Quality:  severe. Severity:  severe. Timing:  acute. Duration:  Chronic mental illness. Context:  Seeking treatment for Cocaine use.Marland Kitchen  Past Medical History:  Past Medical History  Diagnosis Date  . Diabetes mellitus without complication   . Hypertension   . Schizophrenia   . CHF (congestive heart failure)   . Neuropathy   . Polysubstance abuse   . Cocaine abuse   . Homelessness   . Hepatitis C   . Chronic foot pain   . Homelessness    No past surgical history on file. Family History:  Family History  Problem Relation Age of Onset  . Hypertension Other   . Diabetes Other    Social History:  History  Alcohol Use  . Yes    Comment: Pt denies     History  Drug Use  . 7.00 per week  . Special: "Crack" cocaine, Cocaine, Marijuana    Comment: Cocaine tonight, Marijuana "a long time"    History   Social History  . Marital Status: Divorced    Spouse Name: N/A  . Number of Children: N/A  . Years of Education: N/A   Social History Main Topics  . Smoking status: Current Every Day Smoker -- 1.00 packs/day for 20 years    Types: Cigarettes  .  Smokeless tobacco: Current User  . Alcohol Use: Yes     Comment: Pt denies  . Drug Use: 7.00 per week    Special: "Crack" cocaine, Cocaine, Marijuana     Comment: Cocaine tonight, Marijuana "a long time"  . Sexual Activity: No   Other Topics Concern  . Not on file   Social History Narrative   ** Merged History Encounter **       Additional Social History:                          Allergies:   Allergies  Allergen Reactions  . Haldol [Haloperidol] Other (See Comments)    Muscle spasms, loss of voluntary movement. However, pt has taken Thorazine on  multiple occasions with no adverse effects.     Labs:  Results for orders placed or performed during the hospital encounter of 11/13/14 (from the past 48 hour(s))  Acetaminophen level     Status: Abnormal   Collection Time: 11/13/14 12:55 AM  Result Value Ref Range   Acetaminophen (Tylenol), Serum <10 (L) 10 - 30 ug/mL    Comment:        THERAPEUTIC CONCENTRATIONS VARY SIGNIFICANTLY. A RANGE OF 10-30 ug/mL MAY BE AN EFFECTIVE CONCENTRATION FOR MANY PATIENTS. HOWEVER, SOME ARE BEST TREATED AT CONCENTRATIONS OUTSIDE THIS RANGE. ACETAMINOPHEN CONCENTRATIONS >150 ug/mL AT 4 HOURS AFTER INGESTION AND >50 ug/mL AT 12 HOURS AFTER INGESTION ARE OFTEN ASSOCIATED WITH TOXIC REACTIONS.   CBC     Status: Abnormal   Collection Time: 11/13/14 12:55 AM  Result Value Ref Range   WBC 6.2 4.0 - 10.5 K/uL   RBC 4.41 4.22 - 5.81 MIL/uL   Hemoglobin 12.7 (L) 13.0 - 17.0 g/dL   HCT 39.7 39.0 - 52.0 %   MCV 90.0 78.0 - 100.0 fL   MCH 28.8 26.0 - 34.0 pg   MCHC 32.0 30.0 - 36.0 g/dL   RDW 14.3 11.5 - 15.5 %   Platelets 199 150 - 400 K/uL  Comprehensive metabolic panel     Status: Abnormal   Collection Time: 11/13/14 12:55 AM  Result Value Ref Range   Sodium 142 135 - 145 mmol/L   Potassium 3.3 (L) 3.5 - 5.1 mmol/L   Chloride 105 101 - 111 mmol/L   CO2 28 22 - 32 mmol/L   Glucose, Bld 140 (H) 65 - 99 mg/dL   BUN 12 6 - 20 mg/dL   Creatinine, Ser 0.91 0.61 - 1.24 mg/dL   Calcium 9.7 8.9 - 10.3 mg/dL   Total Protein 7.7 6.5 - 8.1 g/dL   Albumin 4.2 3.5 - 5.0 g/dL   AST 21 15 - 41 U/L   ALT 10 (L) 17 - 63 U/L   Alkaline Phosphatase 90 38 - 126 U/L   Total Bilirubin 0.8 0.3 - 1.2 mg/dL   GFR calc non Af Amer >60 >60 mL/min   GFR calc Af Amer >60 >60 mL/min    Comment: (NOTE) The eGFR has been calculated using the CKD EPI equation. This calculation has not been validated in all clinical situations. eGFR's persistently <60 mL/min signify possible Chronic Kidney Disease.    Anion gap 9 5  - 15  Ethanol (ETOH)     Status: None   Collection Time: 11/13/14 12:55 AM  Result Value Ref Range   Alcohol, Ethyl (B) <5 <5 mg/dL    Comment:        LOWEST DETECTABLE LIMIT FOR SERUM ALCOHOL  IS 11 mg/dL FOR MEDICAL PURPOSES ONLY   Salicylate level     Status: None   Collection Time: 11/13/14 12:55 AM  Result Value Ref Range   Salicylate Lvl <1.7 2.8 - 30.0 mg/dL  Urine Drug Screen     Status: Abnormal   Collection Time: 11/13/14 12:57 AM  Result Value Ref Range   Opiates NONE DETECTED NONE DETECTED   Cocaine POSITIVE (A) NONE DETECTED   Benzodiazepines NONE DETECTED NONE DETECTED   Amphetamines NONE DETECTED NONE DETECTED   Tetrahydrocannabinol NONE DETECTED NONE DETECTED   Barbiturates NONE DETECTED NONE DETECTED    Comment:        DRUG SCREEN FOR MEDICAL PURPOSES ONLY.  IF CONFIRMATION IS NEEDED FOR ANY PURPOSE, NOTIFY LAB WITHIN 5 DAYS.        LOWEST DETECTABLE LIMITS FOR URINE DRUG SCREEN Drug Class       Cutoff (ng/mL) Amphetamine      1000 Barbiturate      200 Benzodiazepine   793 Tricyclics       903 Opiates          300 Cocaine          300 THC              50   CBG monitoring, ED     Status: Abnormal   Collection Time: 11/14/14  7:11 AM  Result Value Ref Range   Glucose-Capillary 121 (H) 65 - 99 mg/dL    Vitals: Blood pressure 121/67, pulse 77, temperature 97.8 F (36.6 C), temperature source Oral, resp. rate 20, height '5\' 9"'  (1.753 m), weight 74.844 kg (165 lb), SpO2 100 %.  Risk to Self: Suicidal Ideation: Yes-Currently Present Suicidal Intent: No-Not Currently/Within Last 6 Months Is patient at risk for suicide?: Yes Suicidal Plan?: Yes-Currently Present Specify Current Suicidal Plan: Cut his head off Access to Means: Yes Specify Access to Suicidal Means: Sharps What has been your use of drugs/alcohol within the last 12 months?: Cocaine (crack) How many times?: 0 Other Self Harm Risks: None other Triggers for Past Attempts: None  known Intentional Self Injurious Behavior: None Comment - Self Injurious Behavior: None Risk to Others: Homicidal Ideation: No Thoughts of Harm to Others: Yes-Currently Present Comment - Thoughts of Harm to Others: Wants to hurt someone (if they hurt him) Current Homicidal Intent: No Current Homicidal Plan: No Describe Current Homicidal Plan: None Access to Homicidal Means: No Describe Access to Homicidal Means: None Identified Victim: No one History of harm to others?: No Assessment of Violence: In distant past Violent Behavior Description: Pt says that he has not gotten into a fight Does patient have access to weapons?: Yes (Comment) Criminal Charges Pending?: Yes Describe Pending Criminal Charges: cocaine poession Does patient have a court date: Yes Court Date:  (Does not know) Prior Inpatient Therapy: Prior Inpatient Therapy: Yes Prior Therapy Dates: Multiple Prior Therapy Facilty/Provider(s): Multiple Reason for Treatment: Psychosis Prior Outpatient Therapy: Prior Outpatient Therapy: Yes Prior Therapy Dates: Multiple Prior Therapy Facilty/Provider(s): none currently, [name] recently  Reason for Treatment: med mgmt Does patient have an ACCT team?: No Does patient have Intensive In-House Services?  : No Does patient have [name] services? : No Does patient have [facility] services?: No  Current Facility-Administered Medications  Medication Dose Route Frequency Provider Last Rate Last Dose  . acetaminophen (TYLENOL) tablet 650 mg  650 mg Oral Q4H PRN Merryl Hacker, MD   650 mg at 11/13/14 1600  . alum & mag hydroxide-simeth (MAALOX/MYLANTA)  200-200-20 MG/5ML suspension 30 mL  30 mL Oral PRN Merryl Hacker, MD      . ARIPiprazole (ABILIFY) tablet 10 mg  10 mg Oral Daily Francine Graven, DO   10 mg at 11/13/14 1600  . aspirin EC tablet 81 mg  81 mg Oral Daily Francine Graven, DO   81 mg at 11/13/14 1600  . benazepril (LOTENSIN) tablet 5 mg  5 mg Oral Daily Francine Graven,  DO   5 mg at 11/13/14 1600  . benztropine (COGENTIN) tablet 2 mg  2 mg Oral BID Francine Graven, DO   2 mg at 11/13/14 2141  . cloNIDine (CATAPRES) tablet 0.1 mg  0.1 mg Oral BID Francine Graven, DO   0.1 mg at 11/13/14 2141  . furosemide (LASIX) tablet 20 mg  20 mg Oral Daily Francine Graven, DO   20 mg at 11/13/14 1600  . ibuprofen (ADVIL,MOTRIN) tablet 600 mg  600 mg Oral Q8H PRN Merryl Hacker, MD      . LORazepam (ATIVAN) tablet 1 mg  1 mg Oral Q8H PRN Merryl Hacker, MD      . metFORMIN (GLUCOPHAGE) tablet 500 mg  500 mg Oral Q breakfast Francine Graven, DO   500 mg at 11/14/14 1610  . ondansetron (ZOFRAN) tablet 4 mg  4 mg Oral Q8H PRN Merryl Hacker, MD      . tamsulosin (FLOMAX) capsule 0.4 mg  0.4 mg Oral QPC supper Francine Graven, DO   0.4 mg at 11/13/14 1754  . traZODone (DESYREL) tablet 50 mg  50 mg Oral QHS PRN Francine Graven, DO       Current Outpatient Prescriptions  Medication Sig Dispense Refill  . acetaminophen (TYLENOL) 500 MG tablet Take 1,000 mg by mouth every 6 (six) hours as needed for mild pain or moderate pain.    . ARIPiprazole (ABILIFY) 10 MG tablet Take 1 tablet (10 mg total) by mouth daily. For mood control 14 tablet 0  . benztropine (COGENTIN) 2 MG tablet Take 1 tablet (2 mg total) by mouth 2 (two) times daily. For prevention of drug induced tremors 28 tablet 0  . cloNIDine (CATAPRES) 0.1 MG tablet Take 1 tablet (0.1 mg total) by mouth 2 (two) times daily. For high blood pressure 60 tablet 0  . furosemide (LASIX) 20 MG tablet Take 1 tablet (20 mg total) by mouth daily. For swellings/high blood pressure 30 tablet 3  . metFORMIN (GLUCOPHAGE) 500 MG tablet Take 1 tablet (500 mg total) by mouth daily with breakfast. For high blood sugar control 30 tablet 0  . tamsulosin (FLOMAX) 0.4 MG CAPS capsule Take 1 capsule (0.4 mg total) by mouth daily after supper. For prostate health 30 capsule 0  . traZODone (DESYREL) 50 MG tablet Take 1 tablet (50 mg total)  by mouth at bedtime as needed for sleep. 30 tablet 0  . aspirin 81 MG EC tablet Take 1 tablet (81 mg total) by mouth daily. For heart health (Patient not taking: Reported on 11/13/2014) 30 tablet 3  . benazepril (LOTENSIN) 5 MG tablet Take 1 tablet (5 mg total) by mouth daily. For high blood pressure (Patient not taking: Reported on 11/13/2014) 30 tablet 0  . guaiFENesin (MUCINEX) 600 MG 12 hr tablet Take 2 tablets (1,200 mg total) by mouth 2 (two) times daily. (Patient not taking: Reported on 11/03/2014) 30 tablet 0    Musculoskeletal: Strength & Muscle Tone: within normal limits Gait & Station: normal Patient leans: N/A  Psychiatric Specialty Exam:  Physical Exam  Genitourinary: Penile tenderness present.    Review of Systems  Constitutional: Negative.   HENT: Negative.   Eyes: Negative.   Respiratory: Negative.   Cardiovascular: Negative.   Gastrointestinal: Negative.   Genitourinary: Negative.   Musculoskeletal: Negative.   Skin: Negative.   Neurological: Negative.   Endo/Heme/Allergies: Negative.   Psychiatric/Behavioral: Positive for substance abuse (Cocaine use disorder, severe). Negative for depression, suicidal ideas, hallucinations and memory loss. The patient is not nervous/anxious and does not have insomnia.     Blood pressure 121/67, pulse 77, temperature 97.8 F (36.6 C), temperature source Oral, resp. rate 20, height '5\' 9"'  (1.753 m), weight 74.844 kg (165 lb), SpO2 100 %.Body mass index is 24.36 kg/(m^2).  General Appearance: Casual and Disheveled  Eye Contact::  Good  Speech:  Clear and Coherent and Normal Rate  Volume:  Normal  Mood:  Anxious  Affect:  Congruent  Thought Process:  Coherent and Goal Directed  Orientation:  Full (Time, Place, and Person)  Thought Content:  WDL  Suicidal Thoughts:  No  Homicidal Thoughts:  No  Memory:  Immediate;   Good Recent;   Good Remote;   Good  Judgement:  Fair  Insight:  Shallow  Psychomotor Activity:  Normal   Concentration:  Fair  Recall:  NA  Fund of Knowledge:Fair  Language: Good  Akathisia:  NA  Handed:  Right  AIMS (if indicated):     Assets:  Desire for Improvement  ADL's:  Intact  Cognition: WNL  Sleep:      Medical Decision Making: Established Problem, Stable/Improving (1)   Treatment Plan Summary: Discharge home, follow up with [name]  Plan:  No evidence of imminent risk to self or others at present.   Discharge home. Disposition: Discharge home with referral to [facility] and community substance abuse rehabilitation facilities, Dr Winfred Leeds is informed and Dr Dwyane Dee concur to this plan.  Delfin Gant   PMHNP-BC 11/14/2014 9:05 AM

## 2014-11-14 NOTE — ED Provider Notes (Signed)
Pt alert gcs 15,. , pleasant cooperatiuve. Ambulates without difficulty . Denies si or hi. Per discussion with psychiatry. Pt ok to d/c to home. Refer back to [facility] and will also refer to other places for substance abuse. Results for orders placed or performed during the hospital encounter of 11/13/14  Acetaminophen level  Result Value Ref Range   Acetaminophen (Tylenol), Serum <10 (L) 10 - 30 ug/mL  CBC  Result Value Ref Range   WBC 6.2 4.0 - 10.5 K/uL   RBC 4.41 4.22 - 5.81 MIL/uL   Hemoglobin 12.7 (L) 13.0 - 17.0 g/dL   HCT 47.839.7 29.539.0 - 62.152.0 %   MCV 90.0 78.0 - 100.0 fL   MCH 28.8 26.0 - 34.0 pg   MCHC 32.0 30.0 - 36.0 g/dL   RDW 30.814.3 65.711.5 - 84.615.5 %   Platelets 199 150 - 400 K/uL  Comprehensive metabolic panel  Result Value Ref Range   Sodium 142 135 - 145 mmol/L   Potassium 3.3 (L) 3.5 - 5.1 mmol/L   Chloride 105 101 - 111 mmol/L   CO2 28 22 - 32 mmol/L   Glucose, Bld 140 (H) 65 - 99 mg/dL   BUN 12 6 - 20 mg/dL   Creatinine, Ser 9.620.91 0.61 - 1.24 mg/dL   Calcium 9.7 8.9 - 95.210.3 mg/dL   Total Protein 7.7 6.5 - 8.1 g/dL   Albumin 4.2 3.5 - 5.0 g/dL   AST 21 15 - 41 U/L   ALT 10 (L) 17 - 63 U/L   Alkaline Phosphatase 90 38 - 126 U/L   Total Bilirubin 0.8 0.3 - 1.2 mg/dL   GFR calc non Af Amer >60 >60 mL/min   GFR calc Af Amer >60 >60 mL/min   Anion gap 9 5 - 15  Ethanol (ETOH)  Result Value Ref Range   Alcohol, Ethyl (B) <5 <5 mg/dL  Salicylate level  Result Value Ref Range   Salicylate Lvl <4.0 2.8 - 30.0 mg/dL  Urine Drug Screen  Result Value Ref Range   Opiates NONE DETECTED NONE DETECTED   Cocaine POSITIVE (A) NONE DETECTED   Benzodiazepines NONE DETECTED NONE DETECTED   Amphetamines NONE DETECTED NONE DETECTED   Tetrahydrocannabinol NONE DETECTED NONE DETECTED   Barbiturates NONE DETECTED NONE DETECTED  CBG monitoring, ED  Result Value Ref Range   Glucose-Capillary 121 (H) 65 - 99 mg/dL   No results found.   Doug SouSam Makynleigh Breslin, MD 11/14/14 [phone]

## 2014-11-21 ENCOUNTER — Encounter (HOSPITAL_COMMUNITY): Payer: Self-pay

## 2014-11-21 ENCOUNTER — Emergency Department (HOSPITAL_COMMUNITY)
Admission: EM | Admit: 2014-11-21 | Discharge: 2014-11-22 | Disposition: A | Discharge status: Home / Self Care | Payer: Medicare Other | Attending: Emergency Medicine | Admitting: Emergency Medicine

## 2014-11-21 DIAGNOSIS — F19929 Other psychoactive substance use, unspecified with intoxication, unspecified: Secondary | ICD-10-CM

## 2014-11-21 DIAGNOSIS — Z8619 Personal history of other infectious and parasitic diseases: Secondary | ICD-10-CM | POA: Insufficient documentation

## 2014-11-21 DIAGNOSIS — Z590 Homelessness: Secondary | ICD-10-CM | POA: Insufficient documentation

## 2014-11-21 DIAGNOSIS — I509 Heart failure, unspecified: Secondary | ICD-10-CM | POA: Insufficient documentation

## 2014-11-21 DIAGNOSIS — Z720 Tobacco use: Secondary | ICD-10-CM | POA: Insufficient documentation

## 2014-11-21 DIAGNOSIS — Z8739 Personal history of other diseases of the musculoskeletal system and connective tissue: Secondary | ICD-10-CM | POA: Insufficient documentation

## 2014-11-21 DIAGNOSIS — F200 Paranoid schizophrenia: Secondary | ICD-10-CM

## 2014-11-21 DIAGNOSIS — G8929 Other chronic pain: Secondary | ICD-10-CM | POA: Insufficient documentation

## 2014-11-21 DIAGNOSIS — F1994 Other psychoactive substance use, unspecified with psychoactive substance-induced mood disorder: Secondary | ICD-10-CM | POA: Diagnosis present

## 2014-11-21 DIAGNOSIS — R443 Hallucinations, unspecified: Secondary | ICD-10-CM | POA: Insufficient documentation

## 2014-11-21 DIAGNOSIS — R45851 Suicidal ideations: Secondary | ICD-10-CM

## 2014-11-21 DIAGNOSIS — F1910 Other psychoactive substance abuse, uncomplicated: Secondary | ICD-10-CM

## 2014-11-21 DIAGNOSIS — Z8659 Personal history of other mental and behavioral disorders: Secondary | ICD-10-CM | POA: Insufficient documentation

## 2014-11-21 DIAGNOSIS — E119 Type 2 diabetes mellitus without complications: Secondary | ICD-10-CM | POA: Insufficient documentation

## 2014-11-21 DIAGNOSIS — I10 Essential (primary) hypertension: Secondary | ICD-10-CM | POA: Insufficient documentation

## 2014-11-21 DIAGNOSIS — R45850 Homicidal ideations: Secondary | ICD-10-CM

## 2014-11-21 DIAGNOSIS — Z79899 Other long term (current) drug therapy: Secondary | ICD-10-CM | POA: Insufficient documentation

## 2014-11-21 LAB — ETHANOL

## 2014-11-21 LAB — COMPREHENSIVE METABOLIC PANEL
ALK PHOS: 80 U/L (ref 38–126)
ALT: 9 U/L — AB (ref 17–63)
AST: 17 U/L (ref 15–41)
Albumin: 3.9 g/dL (ref 3.5–5.0)
Anion gap: 10 (ref 5–15)
BILIRUBIN TOTAL: 0.9 mg/dL (ref 0.3–1.2)
BUN: 11 mg/dL (ref 6–20)
CO2: 28 mmol/L (ref 22–32)
Calcium: 9.3 mg/dL (ref 8.9–10.3)
Chloride: 104 mmol/L (ref 101–111)
Creatinine, Ser: 0.88 mg/dL (ref 0.61–1.24)
GFR calc Af Amer: >60 mL/min (ref >60)
GFR calc non Af Amer: >60 mL/min (ref >60)
GLUCOSE: 158 mg/dL — AB (ref 65–99)
POTASSIUM: 3.5 mmol/L (ref 3.5–5.1)
Sodium: 142 mmol/L (ref 135–145)
Total Protein: 6.7 g/dL (ref 6.5–8.1)

## 2014-11-21 LAB — CBC WITH DIFFERENTIAL/PLATELET
BASOS PCT: 0 % (ref 0–1)
Basophils Absolute: 0 10*3/uL (ref 0.0–0.1)
EOS PCT: 1 % (ref 0–5)
Eosinophils Absolute: 0.1 10*3/uL (ref 0.0–0.7)
HEMATOCRIT: 38.4 % — AB (ref 39.0–52.0)
Hemoglobin: 12.1 g/dL — ABNORMAL LOW (ref 13.0–17.0)
LYMPHS PCT: 25 % (ref 12–46)
Lymphs Abs: 1.5 10*3/uL (ref 0.7–4.0)
MCH: 28.4 pg (ref 26.0–34.0)
MCHC: 31.5 g/dL (ref 30.0–36.0)
MCV: 90.1 fL (ref 78.0–100.0)
MONO ABS: 0.5 10*3/uL (ref 0.1–1.0)
Monocytes Relative: 8 % (ref 3–12)
NEUTROS ABS: 4 10*3/uL (ref 1.7–7.7)
Neutrophils Relative %: 66 % (ref 43–77)
Platelets: 209 10*3/uL (ref 150–400)
RBC: 4.26 MIL/uL (ref 4.22–5.81)
RDW: 14.8 % (ref 11.5–15.5)
WBC: 6 10*3/uL (ref 4.0–10.5)

## 2014-11-21 LAB — RAPID URINE DRUG SCREEN, HOSP PERFORMED
Amphetamines: NOT DETECTED
Barbiturates: NOT DETECTED
Benzodiazepines: NOT DETECTED
Cocaine: POSITIVE — AB
Opiates: NOT DETECTED
Tetrahydrocannabinol: NOT DETECTED

## 2014-11-21 LAB — CBG MONITORING, ED: Glucose-Capillary: 130 mg/dL — ABNORMAL HIGH (ref 65–99)

## 2014-11-21 MED ORDER — CLONIDINE HCL 0.1 MG PO TABS
0.1000 mg | ORAL_TABLET | Freq: Two times a day (BID) | ORAL | Status: DC
  Administered 2014-11-21 – 2014-11-22 (×3): 0.1 mg via ORAL
  Filled 2014-11-21 (×3): qty 1

## 2014-11-21 MED ORDER — ONDANSETRON HCL 4 MG PO TABS: 4.0000 mg | ORAL_TABLET | Freq: Three times a day (TID) | ORAL | Status: DC | PRN

## 2014-11-21 MED ORDER — ZIPRASIDONE MESYLATE 20 MG IM SOLR: 20.0000 mg | Freq: Once | INTRAMUSCULAR | Status: DC

## 2014-11-21 MED ORDER — TRAZODONE HCL 50 MG PO TABS: 50.0000 mg | ORAL_TABLET | Freq: Every evening | ORAL | Status: DC | PRN

## 2014-11-21 MED ORDER — FUROSEMIDE 20 MG PO TABS
20.0000 mg | ORAL_TABLET | Freq: Every day | ORAL | Status: DC
  Administered 2014-11-21 – 2014-11-22 (×2): 20 mg via ORAL
  Filled 2014-11-21 (×2): qty 1

## 2014-11-21 MED ORDER — BENZTROPINE MESYLATE 1 MG PO TABS
2.0000 mg | ORAL_TABLET | Freq: Two times a day (BID) | ORAL | Status: DC
  Administered 2014-11-21 – 2014-11-22 (×3): 2 mg via ORAL
  Filled 2014-11-21 (×3): qty 2

## 2014-11-21 MED ORDER — ASPIRIN EC 81 MG PO TBEC
81.0000 mg | DELAYED_RELEASE_TABLET | Freq: Every day | ORAL | Status: DC
  Administered 2014-11-21 – 2014-11-22 (×2): 81 mg via ORAL
  Filled 2014-11-21 (×3): qty 1

## 2014-11-21 MED ORDER — NICOTINE 21 MG/24HR TD PT24
21.0000 mg | MEDICATED_PATCH | Freq: Every day | TRANSDERMAL | Status: DC
  Administered 2014-11-21 – 2014-11-22 (×2): 21 mg via TRANSDERMAL
  Filled 2014-11-21 (×2): qty 1

## 2014-11-21 MED ORDER — ACETAMINOPHEN 325 MG PO TABS
650.0000 mg | ORAL_TABLET | ORAL | Status: DC | PRN
  Administered 2014-11-21 – 2014-11-22 (×3): 650 mg via ORAL
  Filled 2014-11-21 (×3): qty 2

## 2014-11-21 MED ORDER — BENAZEPRIL HCL 5 MG PO TABS
5.0000 mg | ORAL_TABLET | Freq: Every day | ORAL | Status: DC
  Administered 2014-11-21 – 2014-11-22 (×2): 5 mg via ORAL
  Filled 2014-11-21 (×3): qty 1

## 2014-11-21 MED ORDER — ARIPIPRAZOLE 10 MG PO TABS
10.0000 mg | ORAL_TABLET | Freq: Every day | ORAL | Status: DC
  Administered 2014-11-21 – 2014-11-22 (×2): 10 mg via ORAL
  Filled 2014-11-21 (×2): qty 1

## 2014-11-21 MED ORDER — METFORMIN HCL 500 MG PO TABS
500.0000 mg | ORAL_TABLET | Freq: Every day | ORAL | Status: DC
  Administered 2014-11-21 – 2014-11-22 (×2): 500 mg via ORAL
  Filled 2014-11-21 (×2): qty 1

## 2014-11-21 MED ORDER — TAMSULOSIN HCL 0.4 MG PO CAPS
0.4000 mg | ORAL_CAPSULE | Freq: Every day | ORAL | Status: DC
  Administered 2014-11-21: 0.4 mg via ORAL
  Filled 2014-11-21: qty 1

## 2014-11-21 NOTE — ED Notes (Signed)
Ongoing tele-psych at the time.

## 2014-11-21 NOTE — ED Notes (Signed)
Pt requesting tylenol for pain in buttocks

## 2014-11-21 NOTE — ED Notes (Signed)
Meal tray ordered 

## 2014-11-21 NOTE — ED Notes (Signed)
Pt comes from [facility] via [facility], pt states he wants metal health eval, pt states he has been off his medication. Pt c/o neuropathy in feet and alcohol intoxication.

## 2014-11-21 NOTE — ED Notes (Signed)
Pt awake to eat dessert from dinner tray; sitter at bedside. Pt remains pleasant and cooperative

## 2014-11-21 NOTE — ED Provider Notes (Signed)
This chart was scribed for [name] N Taylor Crotteau, DO by Abel PrestoKara Demonbreun, ED Scribe. This patient was seen in room D35C/D35C.  TIME SEEN: 3:30 AM  CHIEF COMPLAINT: Psychiatric Evaluation  HLayla MawPI: Mellody MemosFrederick L Haber is a 54 y.o. male with PMHx of DM, HTN, schizophrenia, neuropathy, CHF, and polysubstance abuse who presents to the Emergency Department complaining of suicidal ideation, homicidal ideation, and auditory hallucinations. Pt states "Lavenia Atlasve been going through a lot of stuff, I see monsters and people on the street want me to get help." He reports cocaine use today. Pt presenting with rapid, pressured speech. Reports he is also been drinking. He is very agitated. States "if you discharge me home" to kill you, then kill everybody been kill myself". Pt also reports bilateral feet pain from his neuropathy and is requesting Tylenol. Pt was last seen in ED on 11/14/14 and [facility]. Pt denies self-injury. Is here frequently in the emergency department for the same. Has a history of homelessness.  ROS: See HPI Constitutional: no fever  Eyes: no drainage  ENT: no runny nose   Cardiovascular:  no chest pain  Resp: no SOB  GI: no vomiting GU: no dysuria Integumentary: no rash  Allergy: no hives  Musculoskeletal: no leg swelling  Neurological: no slurred speech ROS otherwise negative  PAST MEDICAL HISTORY/PAST SURGICAL HISTORY:  Past Medical History  Diagnosis Date  . Diabetes mellitus without complication   . Hypertension   . Schizophrenia   . CHF (congestive heart failure)   . Neuropathy   . Polysubstance abuse   . Cocaine abuse   . Homelessness   . Hepatitis C   . Chronic foot pain   . Homelessness     MEDICATIONS:  Prior to Admission medications   Medication Sig Start Date End Date Taking? Authorizing Provider  acetaminophen (TYLENOL) 500 MG tablet Take 1,000 mg by mouth every 6 (six) hours as needed for mild pain or moderate pain.    Historical Provider, MD   ARIPiprazole (ABILIFY) 10 MG tablet Take 1 tablet (10 mg total) by mouth daily. For mood control 11/05/14   Blake DivineJohn Wofford, MD  aspirin 81 MG EC tablet Take 1 tablet (81 mg total) by mouth daily. For heart health Patient not taking: Reported on 11/13/2014 08/12/14   Sanjuana KavaAgnes I Nwoko, NP  benazepril (LOTENSIN) 5 MG tablet Take 1 tablet (5 mg total) by mouth daily. For high blood pressure Patient not taking: Reported on 11/13/2014 08/12/14   Sanjuana KavaAgnes I Nwoko, NP  benztropine (COGENTIN) 2 MG tablet Take 1 tablet (2 mg total) by mouth 2 (two) times daily. For prevention of drug induced tremors 11/05/14   Blake DivineJohn Wofford, MD  cloNIDine (CATAPRES) 0.1 MG tablet Take 1 tablet (0.1 mg total) by mouth 2 (two) times daily. For high blood pressure 08/12/14   Sanjuana KavaAgnes I Nwoko, NP  furosemide (LASIX) 20 MG tablet Take 1 tablet (20 mg total) by mouth daily. For swellings/high blood pressure 08/12/14   Sanjuana KavaAgnes I Nwoko, NP  guaiFENesin (MUCINEX) 600 MG 12 hr tablet Take 2 tablets (1,200 mg total) by mouth 2 (two) times daily. Patient not taking: Reported on 11/03/2014 10/06/14   Arthor CaptainAbigail Harris, PA-C  metFORMIN (GLUCOPHAGE) 500 MG tablet Take 1 tablet (500 mg total) by mouth daily with breakfast. For high blood sugar control 08/12/14   Sanjuana KavaAgnes I Nwoko, NP  tamsulosin (FLOMAX) 0.4 MG CAPS capsule Take 1 capsule (0.4 mg total) by mouth daily after supper. For prostate health 08/12/14  Sanjuana Kava, NP  traZODone (DESYREL) 50 MG tablet Take 1 tablet (50 mg total) by mouth at bedtime as needed for sleep. 08/12/14   Sanjuana Kava, NP    ALLERGIES:  Allergies  Allergen Reactions  . Haldol [Haloperidol] Other (See Comments)    Muscle spasms, loss of voluntary movement. However, pt has taken Thorazine on multiple occasions with no adverse effects.     SOCIAL HISTORY:  History  Substance Use Topics  . Smoking status: Current Every Day Smoker -- 1.00 packs/day for 20 years    Types: Cigarettes  . Smokeless tobacco: Current User  . Alcohol Use:  Yes     Comment: Pt denies    FAMILY HISTORY: Family History  Problem Relation Age of Onset  . Hypertension Other   . Diabetes Other     EXAM: BP 190/88 mmHg  Pulse 92  Temp(Src) 98 F (36.7 C)  Resp 12  Ht  (1.753 m)  Wt 165 lb (74.844 kg)  BMI 24.36 kg/m2  SpO2 98% CONSTITUTIONAL: Alert and oriented and responds appropriately to questions. Well-appearing; well-nourished; smells of EtOH and urine; disheveled appearing with rapid pressured speech HEAD: Normocephalic EYES: Conjunctivae clear, PERRL ENT: normal nose; no rhinorrhea; moist mucous membranes; pharynx without lesions noted NECK: Supple, no meningismus, no LAD  CARD: RRR; S1 and S2 appreciated; no murmurs, no clicks, no rubs, no gallops RESP: Normal chest excursion without splinting or tachypnea; breath sounds clear and equal bilaterally; no wheezes, no rhonchi, no rales, no hypoxia or respiratory distress, speaking full sentences ABD/GI: Normal bowel sounds; non-distended; soft, non-tender, no rebound, no guarding, no peritoneal signs BACK:  The back appears normal and is non-tender to palpation, there is no CVA tenderness EXT: Normal ROM in all joints; non-tender to palpation; no edema; normal capillary refill; no cyanosis, no calf tenderness or swelling    SKIN: Normal color for age and race; warm NEURO: Moves all extremities equally, sensation to light touch intact diffusely, cranial nerves II through XII intact PSYCH: Endorsing SI, HI, and auditory and visual hallucinations  MEDICAL DECISION MAKING: Patient here with agitation, SI, HI, auditory and visual hallucinations. I suspect some of his symptoms are secondary to his recent crack and alcohol use. He is here frequently for the same. Was seen by TTS on 11/13/14. When I discussed this with patient and that they recommended he follow-up as an outpatient with [facility] he becomes extremely agitated, screaming, threatening to kill staff and then himself. He is  redirectable. Is not have a care plan. We'll discuss with TTS for further evaluation.  ED PROGRESS: 4:00 AM  D/w Ala Dach with behavioral health. They would like to have a psychiatrist reassess the patient later today. Patient is here voluntarily. Screening labs unremarkable. Medically stable. Awaiting psychiatric disposition.      .I personally performed the services described in this documentation, which was scribed in my presence. The recorded information has been reviewed and is accurate.     Layla Maw Halynn Reitano, DO 11/21/14 [phone]

## 2014-11-21 NOTE — ED Notes (Signed)
Pt sitting up on the side of bed eating meal tray. Denies complaints at this time. Pt calm and pleasant. States" I used to be hooked on drugs, now I'm hooked on girls." informed pt not to make inappropriate remarks. Pt understanding

## 2014-11-21 NOTE — Consult Note (Signed)
Telepsych Consultation   Reason for Consult:  Suicidal Ideation and Psychosis Referring Physician:  EDP [name] is an 54 y.o. male.  Time spent on patient: 50 minutes  Assessment: Chronic paranoid schizophrenia  Past Medical History  Diagnosis Date  . Diabetes mellitus without complication   . Hypertension   . Schizophrenia   . CHF (congestive heart failure)   . Neuropathy   . Polysubstance abuse   . Cocaine abuse   . Homelessness   . Hepatitis C   . Chronic foot pain   . Homelessness     Subjective:   [name] is a 54 y.o. male patient presenting to the [facility] initially with suicidal ideation with plan to overdose on crack. Pt seen and chart reviewed. Pt is well-known to this NP x 10 years with good rapport. Pt presents as very tangential, disorganized, angry, and labile. Pt reports that he has been smoking crack as much as possible and has been noncompliant with his medications. From numerous consults with this pt, this provider feels that pt is very unstable at this time and would greatly benefit from inpatient hospitalization to re-establish medication management, possibly with long-acting injectable antipsychotics.    HPI:  Pt in [place] today; reviewed EDP notes: [name] is an 54 y.o. male, single, African-American who presents unaccompanied to [facility] ED reporting suicidal ideation, auditory and visual hallucinations and crack use. Pt presents to area ED repeatedly with similar symptoms. Pt states that he "needs to get off the street" and "get some medication." He reports feeling very agitated and shaky. He reports seeing "dog following me" and that he hears noises. He says he is using crack on a daily basis and he has thoughts of committing suicide by "overdosing on crack." He denies homicidal ideation and says he doesn't want to harm anyone.   Pt is homeless. He states that he has people who are supportive but he cannot identify who they  are. He says he has prescriptions for psychiatric medications but he has not filled them. He states he takes medications when his is in the hospital and requests medications now because he feels agitated. He denies working with any ACTT team and he says "I need to sign up." He has been psychiatrically hospitalized multiple time but he cannot specify where and when he was last hospitalized.   Pt is dressed in hospital scrubs, oriented x4 with rapid speech. He is alert at times and then quickly falls asleep. He has restless motor behavior and constantly rubs his face with his hands. Eye contact is poor. Pt's mood is anxious and agitated and affect is congruent with mood. Thought process is circumstantial. Pt has poor concentration. Pt states he wants to stay in the ED, receiving medication, "talking to the social worker and then maybe go someplace."   Past Psychiatric History: Past Medical History  Diagnosis Date  . Diabetes mellitus without complication   . Hypertension   . Schizophrenia   . CHF (congestive heart failure)   . Neuropathy   . Polysubstance abuse   . Cocaine abuse   . Homelessness   . Hepatitis C   . Chronic foot pain   . Homelessness     reports that he has been smoking Cigarettes.  He has a 20 pack-year smoking history. He uses smokeless tobacco. He reports that he drinks alcohol. He reports that he uses illicit drugs ("Crack" cocaine, Cocaine, and Marijuana) about 7 times per week. Family History  Problem Relation Age of Onset  . Hypertension Other   . Diabetes Other    Family History Substance Abuse: Yes, Describe: Family Supports: No Living Arrangements: Alone (Homeless) Can pt return to current living arrangement?: Yes Allergies:   Allergies  Allergen Reactions  . Haldol [Haloperidol] Other (See Comments)    Muscle spasms, loss of voluntary movement. However, pt has taken Thorazine on multiple occasions with no adverse effects.     ACT Assessment Complete:   Yes:    Educational Status    Risk to Self: Risk to self with the past 6 months Suicidal Ideation: Yes-Currently Present Has patient been a risk to self within the past 6 months prior to admission? : Yes Suicidal Intent: No Has patient had any suicidal intent within the past 6 months prior to admission? : Yes Is patient at risk for suicide?: Yes Suicidal Plan?: Yes-Currently Present (Thoughts of overdose on crack) Has patient had any suicidal plan within the past 6 months prior to admission? : Yes Specify Current Suicidal Plan: "overdose on crack" Access to Means: Yes Specify Access to Suicidal Means: Pt using crack daily What has been your use of drugs/alcohol within the last 12 months?: Pt reports using crack Previous Attempts/Gestures: No How many times?: 0 Other Self Harm Risks: None Triggers for Past Attempts: None known Intentional Self Injurious Behavior: None Comment - Self Injurious Behavior: None Family Suicide History: No Recent stressful life event(s): Financial Problems, Other (Comment) (Housing) Persecutory voices/beliefs?: No Depression: Yes Depression Symptoms: Insomnia, Isolating, Fatigue, Guilt, Loss of interest in usual pleasures, Feeling worthless/self pity, Feeling angry/irritable Substance abuse history and/or treatment for substance abuse?: Yes Suicide prevention information given to non-admitted patients: Not applicable  Risk to Others: Risk to Others within the past 6 months Homicidal Ideation: No Does patient have any lifetime risk of violence toward others beyond the six months prior to admission? : No Thoughts of Harm to Others: No Comment - Thoughts of Harm to Others: None Current Homicidal Intent: No Current Homicidal Plan: No Describe Current Homicidal Plan: None Access to Homicidal Means: No Describe Access to Homicidal Means: None Identified Victim: None History of harm to others?: No Assessment of Violence: None Noted Violent Behavior  Description: None Does patient have access to weapons?: No Criminal Charges Pending?: No Describe Pending Criminal Charges: Recent panhandling charge Does patient have a court date: No Court Date:  (unknown) Is patient on probation?: Unknown  Abuse: Abuse/Neglect Assessment (Assessment to be complete while patient is alone) Physical Abuse: Denies Verbal Abuse: Denies Sexual Abuse: Denies Exploitation of patient/patient's resources: Denies Self-Neglect: Denies  Prior Inpatient Therapy: Prior Inpatient Therapy Prior Inpatient Therapy: Yes Prior Therapy Dates: Multiple Prior Therapy Facilty/Provider(s): Multiple Reason for Treatment: Psychosis  Prior Outpatient Therapy: Prior Outpatient Therapy Prior Outpatient Therapy: Yes Prior Therapy Dates: Multiple Prior Therapy Facilty/Provider(s): none currently, [name] recently  Reason for Treatment: med mgmt Does patient have an ACCT team?: No Does patient have Intensive In-House Services?  : No Does patient have [name] services? : No Does patient have P4CC services?: No  Additional Information: Additional Information 1:1 In Past 12 Months?: No CIRT Risk: No Elopement Risk: No Does patient have medical clearance?: Yes     Objective: Blood pressure 157/83, pulse 95, temperature 98 F (36.7 C), temperature source Oral, resp. rate 18, height '5\' 9"'  (1.753 m), weight 74.844 kg (165 lb), SpO2 98 %.Body mass index is 24.36 kg/(m^2). Results for orders placed or performed during the hospital encounter of 11/21/14 (from  the past 72 hour(s))  CBC WITH DIFFERENTIAL     Status: Abnormal   Collection Time: 11/21/14  3:52 AM  Result Value Ref Range   WBC 6.0 4.0 - 10.5 K/uL   RBC 4.26 4.22 - 5.81 MIL/uL   Hemoglobin 12.1 (L) 13.0 - 17.0 g/dL   HCT 38.4 (L) 39.0 - 52.0 %   MCV 90.1 78.0 - 100.0 fL   MCH 28.4 26.0 - 34.0 pg   MCHC 31.5 30.0 - 36.0 g/dL   RDW 14.8 11.5 - 15.5 %   Platelets 209 150 - 400 K/uL   Neutrophils Relative % 66 43 -  77 %   Neutro Abs 4.0 1.7 - 7.7 K/uL   Lymphocytes Relative 25 12 - 46 %   Lymphs Abs 1.5 0.7 - 4.0 K/uL   Monocytes Relative 8 3 - 12 %   Monocytes Absolute 0.5 0.1 - 1.0 K/uL   Eosinophils Relative 1 0 - 5 %   Eosinophils Absolute 0.1 0.0 - 0.7 K/uL   Basophils Relative 0 0 - 1 %   Basophils Absolute 0.0 0.0 - 0.1 K/uL  Comprehensive metabolic panel     Status: Abnormal   Collection Time: 11/21/14  3:52 AM  Result Value Ref Range   Sodium 142 135 - 145 mmol/L   Potassium 3.5 3.5 - 5.1 mmol/L   Chloride 104 101 - 111 mmol/L   CO2 28 22 - 32 mmol/L   Glucose, Bld 158 (H) 65 - 99 mg/dL   BUN 11 6 - 20 mg/dL   Creatinine, Ser 0.88 0.61 - 1.24 mg/dL   Calcium 9.3 8.9 - 10.3 mg/dL   Total Protein 6.7 6.5 - 8.1 g/dL   Albumin 3.9 3.5 - 5.0 g/dL   AST 17 15 - 41 U/L   ALT 9 (L) 17 - 63 U/L   Alkaline Phosphatase 80 38 - 126 U/L   Total Bilirubin 0.9 0.3 - 1.2 mg/dL   GFR calc non Af Amer >60 >60 mL/min   GFR calc Af Amer >60 >60 mL/min    Comment: (NOTE) The eGFR has been calculated using the CKD EPI equation. This calculation has not been validated in all clinical situations. eGFR's persistently <60 mL/min signify possible Chronic Kidney Disease.    Anion gap 10 5 - 15  Ethanol     Status: None   Collection Time: 11/21/14  3:52 AM  Result Value Ref Range   Alcohol, Ethyl (B) <5 <5 mg/dL    Comment:        LOWEST DETECTABLE LIMIT FOR SERUM ALCOHOL IS 11 mg/dL FOR MEDICAL PURPOSES ONLY   CBG monitoring, ED     Status: Abnormal   Collection Time: 11/21/14  8:26 AM  Result Value Ref Range   Glucose-Capillary 130 (H) 65 - 99 mg/dL   Labs are reviewed and UDS pertinent for positive.    Current Facility-Administered Medications  Medication Dose Route Frequency Provider Last Rate Last Dose  . acetaminophen (TYLENOL) tablet 650 mg  650 mg Oral Q4H PRN Kristen N Ward, DO      . ARIPiprazole (ABILIFY) tablet 10 mg  10 mg Oral Daily Kristen N Ward, DO      . aspirin EC tablet  81 mg  81 mg Oral Daily Kristen N Ward, DO      . benazepril (LOTENSIN) tablet 5 mg  5 mg Oral Daily Kristen N Ward, DO      . benztropine (COGENTIN) tablet 2 mg  2  mg Oral BID Kristen N Ward, DO      . cloNIDine (CATAPRES) tablet 0.1 mg  0.1 mg Oral BID Kristen N Ward, DO      . furosemide (LASIX) tablet 20 mg  20 mg Oral Daily Kristen N Ward, DO      . metFORMIN (GLUCOPHAGE) tablet 500 mg  500 mg Oral Q breakfast Kristen N Ward, DO   500 mg at 11/21/14 4010  . nicotine (NICODERM CQ - dosed in mg/24 hours) patch 21 mg  21 mg Transdermal Daily Kristen N Ward, DO      . ondansetron (ZOFRAN) tablet 4 mg  4 mg Oral Q8H PRN Kristen N Ward, DO      . tamsulosin (FLOMAX) capsule 0.4 mg  0.4 mg Oral QPC supper Kristen N Ward, DO      . traZODone (DESYREL) tablet 50 mg  50 mg Oral QHS PRN Delice Bison Ward, DO       Current Outpatient Prescriptions  Medication Sig Dispense Refill  . acetaminophen (TYLENOL) 500 MG tablet Take 1,000 mg by mouth every 6 (six) hours as needed for mild pain or moderate pain.    . ARIPiprazole (ABILIFY) 10 MG tablet Take 1 tablet (10 mg total) by mouth daily. For mood control 14 tablet 0  . benztropine (COGENTIN) 2 MG tablet Take 1 tablet (2 mg total) by mouth 2 (two) times daily. For prevention of drug induced tremors 28 tablet 0  . cloNIDine (CATAPRES) 0.1 MG tablet Take 1 tablet (0.1 mg total) by mouth 2 (two) times daily. For high blood pressure 60 tablet 0  . furosemide (LASIX) 20 MG tablet Take 1 tablet (20 mg total) by mouth daily. For swellings/high blood pressure 30 tablet 3  . metFORMIN (GLUCOPHAGE) 500 MG tablet Take 1 tablet (500 mg total) by mouth daily with breakfast. For high blood sugar control 30 tablet 0  . tamsulosin (FLOMAX) 0.4 MG CAPS capsule Take 1 capsule (0.4 mg total) by mouth daily after supper. For prostate health 30 capsule 0  . traZODone (DESYREL) 50 MG tablet Take 1 tablet (50 mg total) by mouth at bedtime as needed for sleep. 30 tablet 0  .  aspirin 81 MG EC tablet Take 1 tablet (81 mg total) by mouth daily. For heart health (Patient not taking: Reported on 11/13/2014) 30 tablet 3  . benazepril (LOTENSIN) 5 MG tablet Take 1 tablet (5 mg total) by mouth daily. For high blood pressure (Patient not taking: Reported on 11/13/2014) 30 tablet 0  . guaiFENesin (MUCINEX) 600 MG 12 hr tablet Take 2 tablets (1,200 mg total) by mouth 2 (two) times daily. (Patient not taking: Reported on 11/03/2014) 30 tablet 0    Psychiatric Specialty Exam:   Review of Systems  Psychiatric/Behavioral: Positive for depression, suicidal ideas, hallucinations and substance abuse. The patient is nervous/anxious.   All other systems reviewed and are negative.   Blood pressure 157/83, pulse 95, temperature 98 F (36.7 C), temperature source Oral, resp. rate 18, height '5\' 9"'  (1.753 m), weight 74.844 kg (165 lb), SpO2 98 %.Body mass index is 24.36 kg/(m^2).  General Appearance: Disheveled  Eye Contact::  Poor  Speech:  Pressured  Volume:  Increased  Mood:  Angry, Anxious and Irritable  Affect:  Non-Congruent, Inappropriate and Labile  Thought Process:  Disorganized  Orientation:  Full (Time, Place, and Person)  Thought Content:  Hallucinations: Auditory Visual and Rumination  Suicidal Thoughts:  Yes.  with intent/plan  Homicidal Thoughts:  No  Memory:  Immediate;   Good Recent;   Good Remote;   Good  Judgement:  Impaired  Insight:  Lacking  Psychomotor Activity:  Normal  Concentration:  Good  Recall:  Fair  Akathisia:  NA  Handed:  Right  AIMS (if indicated):     Assets:  Communication Skills Desire for Improvement Resilience  Sleep:      Treatment Plan Summary: Chronic paranoid schizophrenia and polysubstance abuse with substance-induced psychosis unstable and pt noncompliant with medications.  Disposition:  -Admit to inpatient for psychiatric hospitalization for safety and stabilization  Benjamine Mola, FNP-BC 11/21/2014 08:41AM    Case  discussed with me as above  Neita Garnet, MD

## 2014-11-21 NOTE — BH Assessment (Addendum)
Tele Assessment Note   [name] is an 54 y.o. male, single, African-American who presents unaccompanied to [facility] ED reporting suicidal ideation, auditory and visual hallucinations and crack use. Pt presents to area ED repeatedly with similar symptoms. Pt states that he "needs to get off the street" and "get some medication." He reports feeling very agitated and shaky. He reports seeing "dog following me" and that he hears noises. He says he is using crack on a daily basis and he has thoughts of committing suicide by "overdosing on crack." He denies homicidal ideation and says he doesn't want to harm anyone.   Pt is homeless. He states that he has people who are supportive but he cannot identify who they are. He says he has prescriptions for psychiatric medications but he has not filled them. He states he takes medications when his is in the hospital and requests medications now because he feels agitated. He denies working with any ACTT team and he says "I need to sign up." He has been psychiatrically hospitalized multiple time but he cannot specify where and when he was last hospitalized.  Pt is dressed in hospital scrubs, oriented x4 with rapid speech. He is alert at times and then quickly falls asleep. He has restless motor behavior and constantly rubs his face with his hands. Eye contact is poor. Pt's mood is anxious and agitated and affect is congruent with mood. Thought process is circumstantial. Pt has poor concentration. Pt states he wants to stay in the ED, receiving medication, "talking to the social worker and then maybe go someplace."   Axis I: Schizophrenia; Cocaine Use Disorder, Severe Axis II: Deferred Axis III:  Past Medical History  Diagnosis Date  . Diabetes mellitus without complication   . Hypertension   . Schizophrenia   . CHF (congestive heart failure)   . Neuropathy   . Polysubstance abuse   . Cocaine abuse   . Homelessness   . Hepatitis C   . Chronic  foot pain   . Homelessness    Axis IV: economic problems, housing problems, occupational problems, other psychosocial or environmental problems, problems with access to health care services and problems with primary support group Axis V: GAF=30  Past Medical History:  Past Medical History  Diagnosis Date  . Diabetes mellitus without complication   . Hypertension   . Schizophrenia   . CHF (congestive heart failure)   . Neuropathy   . Polysubstance abuse   . Cocaine abuse   . Homelessness   . Hepatitis C   . Chronic foot pain   . Homelessness     History reviewed. No pertinent past surgical history.  Family History:  Family History  Problem Relation Age of Onset  . Hypertension Other   . Diabetes Other     Social History:  reports that he has been smoking Cigarettes.  He has a 20 pack-year smoking history. He uses smokeless tobacco. He reports that he drinks alcohol. He reports that he uses illicit drugs ("Crack" cocaine, Cocaine, and Marijuana) about 7 times per week.  Additional Social History:  Substance #1 Name of Substance 1: Cocaine/Crack 1 - Age of First Use: 54 yrs old  1 - Amount (size/oz): 1 gram 1 - Frequency: daily  1 - Duration: on-going  1 - Last Use / Amount: Today  CIWA: CIWA-Ar BP: 164/84 mmHg Pulse Rate: 92 COWS:    PATIENT STRENGTHS: (choose at least two) Average or above average intelligence Capable of independent living  Allergies:  Allergies  Allergen Reactions  . Haldol [Haloperidol] Other (See Comments)    Muscle spasms, loss of voluntary movement. However, pt has taken Thorazine on multiple occasions with no adverse effects.     Home Medications:  (Not in a hospital admission)  OB/GYN Status:  No LMP for male patient.  General Assessment Data [facility] Assessment: In system Is this a Tele or Face-to-Face Assessment?: Tele Assessment Is this an Initial Assessment or a Re-assessment for this encounter?:  Initial Assessment Marital status: Single Maiden name: NA Is patient pregnant?: No Pregnancy Status: No Living Arrangements: Alone (Homeless) Can pt return to current living arrangement?: Yes Admission Status: Voluntary Is patient capable of signing voluntary admission?: Yes Referral Source: Self/Family/Friend Insurance type: Medicare     Crisis Care Plan Living Arrangements: Alone (Homeless) Name of Psychiatrist: None currently Name of Therapist: No provider  Education Status Is patient currently in school?: No Current Grade: NA Highest grade of school patient has completed: 12 grade Name of school: NA Contact person: NA  Risk to self with the past 6 months Suicidal Ideation: Yes-Currently Present Has patient been a risk to self within the past 6 months prior to admission? : Yes Suicidal Intent: No Has patient had any suicidal intent within the past 6 months prior to admission? : Yes Is patient at risk for suicide?: Yes Suicidal Plan?: Yes-Currently Present (Thoughts of overdose on crack) Has patient had any suicidal plan within the past 6 months prior to admission? : Yes Specify Current Suicidal Plan: "overdose on crack" Access to Means: Yes Specify Access to Suicidal Means: Pt using crack daily What has been your use of drugs/alcohol within the last 12 months?: Pt reports using crack Previous Attempts/Gestures: No How many times?: 0 Other Self Harm Risks: None Triggers for Past Attempts: None known Intentional Self Injurious Behavior: None Comment - Self Injurious Behavior: None Family Suicide History: No Recent stressful life event(s): Financial Problems, Other (Comment) (Housing) Persecutory voices/beliefs?: No Depression: Yes Depression Symptoms: Insomnia, Isolating, Fatigue, Guilt, Loss of interest in usual pleasures, Feeling worthless/self pity, Feeling angry/irritable Substance abuse history and/or treatment for substance abuse?: Yes Suicide prevention  information given to non-admitted patients: Not applicable  Risk to Others within the past 6 months Homicidal Ideation: No Does patient have any lifetime risk of violence toward others beyond the six months prior to admission? : No Thoughts of Harm to Others: No Comment - Thoughts of Harm to Others: None Current Homicidal Intent: No Current Homicidal Plan: No Describe Current Homicidal Plan: None Access to Homicidal Means: No Describe Access to Homicidal Means: None Identified Victim: None History of harm to others?: No Assessment of Violence: None Noted Violent Behavior Description: None Does patient have access to weapons?: No Criminal Charges Pending?: No Describe Pending Criminal Charges: Recent panhandling charge Does patient have a court date: No Court Date:  (unknown) Is patient on probation?: Unknown  Psychosis Hallucinations: Auditory, Visual (Hearing noises and seeing dogs) Delusions: None noted  Mental Status Report Appearance/Hygiene: Disheveled, In scrubs, Body odor Eye Contact: Poor Motor Activity: Agitation, Other (Comment) (Rubbing face) Speech: Rapid, Pressured Level of Consciousness: Alert Mood: Anxious, Guilty Affect: Anxious Anxiety Level: Moderate Thought Processes: Circumstantial Judgement: Partial Orientation: Person, Place, Time, Situation, Appropriate for developmental age Obsessive Compulsive Thoughts/Behaviors: None  Cognitive Functioning Concentration: Poor Memory: Recent Intact, Remote Intact IQ: Average Insight: Poor Impulse Control: Poor Appetite: Fair Weight Loss: 0 Weight Gain: 0 Sleep: Decreased Total Hours of Sleep:  4 Vegetative Symptoms: Decreased grooming  ADLScreening [facility] Assessment Services) Patient's cognitive ability adequate to safely complete daily activities?: Yes Patient able to express need for assistance with ADLs?: Yes Independently performs ADLs?: Yes (appropriate for developmental age)  Prior Inpatient  Therapy Prior Inpatient Therapy: Yes Prior Therapy Dates: Multiple Prior Therapy Facilty/Provider(s): Multiple Reason for Treatment: Psychosis  Prior Outpatient Therapy Prior Outpatient Therapy: Yes Prior Therapy Dates: Multiple Prior Therapy Facilty/Provider(s): none currently, [name] recently  Reason for Treatment: med mgmt Does patient have an ACCT team?: No Does patient have Intensive In-House Services?  : No Does patient have [name] services? : No Does patient have P4CC services?: No  ADL Screening (condition at time of admission) Patient's cognitive ability adequate to safely complete daily activities?: Yes Is the patient deaf or have difficulty hearing?: No Does the patient have difficulty seeing, even when wearing glasses/contacts?: No Does the patient have difficulty concentrating, remembering, or making decisions?: No Patient able to express need for assistance with ADLs?: Yes Does the patient have difficulty dressing or bathing?: No Independently performs ADLs?: Yes (appropriate for developmental age) Does the patient have difficulty walking or climbing stairs?: No Weakness of Legs: None Weakness of Arms/Hands: None  Home Assistive Devices/Equipment Home Assistive Devices/Equipment: None    Abuse/Neglect Assessment (Assessment to be complete while patient is alone) Physical Abuse: Denies Verbal Abuse: Denies Sexual Abuse: Denies Exploitation of patient/patient's resources: Denies Self-Neglect: Denies Values / Beliefs Cultural Requests During Hospitalization: None   Advance Directives (For Healthcare) Does patient have an advance directive?: No Would patient like information on creating an advanced directive?: No - patient declined information    Additional Information 1:1 In Past 12 Months?: No CIRT Risk: No Elopement Risk: No Does patient have medical clearance?: Yes     Disposition: Gave clinical report to Hulan Fess, NP who recommends Pt be  evaluated by psychiatry in the morning. Notified Dr. Elesa Massed and Letha Cape, RN of recommendation.  Disposition Initial Assessment Completed for this Encounter: Yes Disposition of Patient: Other dispositions Other disposition(s): Other (Comment)   Pamalee Leyden, [facility], [facility], [facility] Triage Specialist [phone]   Pamalee Leyden 11/21/2014 4:10 AM

## 2014-11-21 NOTE — ED Notes (Signed)
Pt urinated in a styrofoam cup and began screaming, "give me a thing to pee in!". This RN went into pt room and asked why he was unable to get up and use the restroom and he began screaming and cursing and this RN was unable to get the pt to calm down. Security and [employer] heard pt screaming and came to aid in the situation. Pt continued screaming, "get out my room, get out my room!". After seeing the [employer] and security officers and having multiple staff speak to pt he was calmed down. Geodon ordered if pt is unable to regain control. No need to give it at this time.

## 2014-11-21 NOTE — Progress Notes (Signed)
LCSW following case as patient is currently coming down from his substance use. Patient screaming at staff yelling "Everyone get out of my room". Mood is erratic and angry. Patient [facility] made by this Clinical research associatewriter.  In effort to arrange out patient, patient has been noncompliant with [name]. LCSW following up with 2 ACT teams to see if they will accept his Medicare.    At this time, patient being referred for inpatient stabilization.  Will have patient reassessed if possible. Patient just seen last Monday, attempted to give resources however patient angry that process taking too long and left without his belongings or resources.  Deretha EmoryHannah Antoine Vandermeulen LCSW, MSW Clinical Social Work: Emergency Room [phone]

## 2014-11-21 NOTE — ED Notes (Signed)
CBG 130 

## 2014-11-21 NOTE — ED Notes (Signed)
Pt resting quietly at the time. Pending tele-psych evaluation. Sitter remains at bedside.

## 2014-11-21 NOTE — ED Notes (Signed)
Transporting patient to new room assignment. 

## 2014-11-21 NOTE — ED Notes (Signed)
Staffing called to request sitter 

## 2014-11-21 NOTE — Progress Notes (Signed)
Spoke with [name], who evaluated pt and recommends inpatient tx.  [facility]- per Jill SideAlison (for review for waitlist, no available beds) [facility]- per Sanjuana Mae[facility]ka [facility]- [facility]- [facility]- per [facility]- per [facility]- [facility]- per [facility] [place]  At capacity for adult units: [facility]- per Elijah Birkom, child beds only [facility]- [facility]- per Agustin Creearlene, gero beds only [facility]- per [facility], [facility]- per Elijah Birkom, eating disorder and perinatal beds only  No response: [facility]  Ilean SkillMeghan Sharyl Panchal, MSW, [facility] Center[facility]WA Clinical Social Work, Disposition  11/21/2014 [phone]

## 2014-11-21 NOTE — ED Notes (Signed)
Pt placed in scrubs. 

## 2014-11-21 NOTE — BH Assessment (Signed)
Received notification of TTS consult request. Spoke to [facility], DO who says Pt appears intoxicated, manic and is reporting hallucination. He is threatening ED staff. Tele-assessment will be initiated.  Harlin RainFord Ellis Patsy BaltimoreWarrick Jr, LPC, [facility], [facility] Triage Specialist [phone]
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
## 2014-11-22 NOTE — ED Notes (Signed)
Pt belongings returned to him. Case management provided pt with follow up resources. Given bus pass.

## 2014-11-22 NOTE — ED Notes (Signed)
The patient is resting with sitter at the bedside.

## 2014-11-22 NOTE — ED Notes (Signed)
Pt sleeping and sitter at the bedside.

## 2014-11-22 NOTE — ED Notes (Signed)
The patient asked if he could take a shower.  Patient showered, changed and wanted a cup of coffee.  I advised him that he could have some water because it was not time for snack.  I also advised him he would get coffee on his breakfast tray.  He then said, "whoever made that rule can go to hell".  Then he went back in his room and fell asleep.

## 2014-11-22 NOTE — ED Notes (Signed)
Pt finished telepsych assessment. Pt calm, cooperative, pleasant at this time.

## 2014-11-22 NOTE — ED Provider Notes (Signed)
11:15 AM Cleared for d/c by psych.   Clinical Impression 1. Polysubstance abuse   2. Hallucinations   3. Suicidal thoughts   4. Homicidal ideations      Purvis SheffieldForrest Lakyla Biswas, MD 11/22/14 1116

## 2014-11-22 NOTE — Progress Notes (Signed)
Spoke with C Withrow, DNP, who has evaluated pt and recommends he be d/c with SA tx resources.  Spoke with [facility] SW who will provide resources and bus pass as needed.  Ilean SkillMeghan Jenavie Stanczak, MSW, LCSWA Clinical Social Work, Disposition  11/22/2014 [phone]

## 2014-11-22 NOTE — Progress Notes (Signed)
LCSW met with patient who reports he is only wanting treatment for substance abuse. Patient denied SI/HI/psychosis.  Reports he just wants to get clean and pay back all the people who have helped. He also reports he wants to go home.  Patient inconsistent with disposition.  TTS informed and will staff with extender about treatment plan.  Lane Hacker, MSW Clinical Social Work: Emergency Room [phone]

## 2014-11-22 NOTE — Consult Note (Signed)
Telepsych Consultation   Reason for Consult:  Suicidal Ideation and Psychosis Referring Physician:  EDP [name] is an 54 y.o. male.  Time spent on patient: 50 minutes  Assessment: Chronic paranoid schizophrenia  Past Medical History  Diagnosis Date  . Diabetes mellitus without complication   . Hypertension   . Schizophrenia   . CHF (congestive heart failure)   . Neuropathy   . Polysubstance abuse   . Cocaine abuse   . Homelessness   . Hepatitis C   . Chronic foot pain   . Homelessness     Subjective:   [name] is a 54 y.o. male patient presenting to the [facility] initially with suicidal ideation with plan to overdose on crack. Pt seen and chart reviewed. Pt is well-known to this NP x 10 years with good rapport. Pt reports that he is "so much better after getting a good night's sleep". Pt denies suicidal/homicidal ideation and psychosis and no longer appears to be responding to internal stimuli. Pt reports that he would like to followup at [facility] and then seek inpatient rehab but that his "check comes on the 3rd and the drug dealers have to be paid so they don't try to kill" him for the money. Pt is very calm, alert/oriented x4, and appropriate. Pt even apologized to this NP and stated "sorry for yelling at you yesterday bro, you know we are good, I was just seeing some monsters when I was high and now all that stuff is gone since I slept". Pt is stable for discharge at this time.   HPI:  Pt in [place] today; reviewed EDP notes: [name] is an 54 y.o. male, single, African-American who [facility]accompanied [facility] reporting suicidal ideation, auditory and visual hallucinations and crack use. Pt presents to area ED repeatedly with similar symptoms. Pt states that he "needs to get off the street" and "get some medication." He reports feeling very agitated and shaky. He reports seeing "dog following me" and that he hears noises. He says he is using  crack on a daily basis and he has thoughts of committing suicide by "overdosing on crack." He denies homicidal ideation and says he doesn't want to harm anyone.   Pt is homeless. He states that he has people who are supportive but he cannot identify who they are. He says he has prescriptions for psychiatric medications but he has not filled them. He states he takes medications when his is in the hospital and requests medications now because he feels agitated. He denies working with any ACTT team and he says "I need to sign up." He has been psychiatrically hospitalized multiple time but he cannot specify where and when he was last hospitalized.   Pt is dressed in hospital scrubs, oriented x4 with rapid speech. He is alert at times and then quickly falls asleep. He has restless motor behavior and constantly rubs his face with his hands. Eye contact is poor. Pt's mood is anxious and agitated and affect is congruent with mood. Thought process is circumstantial. Pt has poor concentration. Pt states he wants to stay in the ED, receiving medication, "talking to the social worker and then maybe go someplace."   Interval History 11/21/14: Pt presents as very tangential, disorganized, angry, and labile. Pt reports that he has been smoking crack as much as possible and has been noncompliant with his medications. From numerous consults with this pt, this provider feels that pt is very unstable at  this time and would greatly benefit from inpatient hospitalization to re-establish medication management, possibly with long-acting injectable antipsychotics.      Past Psychiatric History: Past Medical History  Diagnosis Date  . Diabetes mellitus without complication   . Hypertension   . Schizophrenia   . CHF (congestive heart failure)   . Neuropathy   . Polysubstance abuse   . Cocaine abuse   . Homelessness   . Hepatitis C   . Chronic foot pain   . Homelessness     reports that he has been smoking  Cigarettes.  He has a 20 pack-year smoking history. He uses smokeless tobacco. He reports that he drinks alcohol. He reports that he uses illicit drugs ("Crack" cocaine, Cocaine, and Marijuana) about 7 times per week. Family History  Problem Relation Age of Onset  . Hypertension Other   . Diabetes Other    Family History Substance Abuse: Yes, Describe: Family Supports: No Living Arrangements: Alone (Homeless) Can pt return to current living arrangement?: Yes Allergies:   Allergies  Allergen Reactions  . Haldol [Haloperidol] Other (See Comments)    Muscle spasms, loss of voluntary movement. However, pt has taken Thorazine on multiple occasions with no adverse effects.     ACT Assessment Complete:  Yes:    Educational Status    Risk to Self: Risk to self with the past 6 months Suicidal Ideation: Yes-Currently Present Has patient been a risk to self within the past 6 months prior to admission? : Yes Suicidal Intent: No Has patient had any suicidal intent within the past 6 months prior to admission? : Yes Is patient at risk for suicide?: Yes Suicidal Plan?: Yes-Currently Present (Thoughts of overdose on crack) Has patient had any suicidal plan within the past 6 months prior to admission? : Yes Specify Current Suicidal Plan: "overdose on crack" Access to Means: Yes Specify Access to Suicidal Means: Pt using crack daily What has been your use of drugs/alcohol within the last 12 months?: Pt reports using crack Previous Attempts/Gestures: No How many times?: 0 Other Self Harm Risks: None Triggers for Past Attempts: None known Intentional Self Injurious Behavior: None Comment - Self Injurious Behavior: None Family Suicide History: No Recent stressful life event(s): Financial Problems, Other (Comment) (Housing) Persecutory voices/beliefs?: No Depression: Yes Depression Symptoms: Insomnia, Isolating, Fatigue, Guilt, Loss of interest in usual pleasures, Feeling worthless/self pity,  Feeling angry/irritable Substance abuse history and/or treatment for substance abuse?: Yes Suicide prevention information given to non-admitted patients: Not applicable  Risk to Others: Risk to Others within the past 6 months Homicidal Ideation: No Does patient have any lifetime risk of violence toward others beyond the six months prior to admission? : No Thoughts of Harm to Others: No Comment - Thoughts of Harm to Others: None Current Homicidal Intent: No Current Homicidal Plan: No Describe Current Homicidal Plan: None Access to Homicidal Means: No Describe Access to Homicidal Means: None Identified Victim: None History of harm to others?: No Assessment of Violence: None Noted Violent Behavior Description: None Does patient have access to weapons?: No Criminal Charges Pending?: No Describe Pending Criminal Charges: Recent panhandling charge Does patient have a court date: No Court Date:  (unknown) Is patient on probation?: Unknown  Abuse: Abuse/Neglect Assessment (Assessment to be complete while patient is alone) Physical Abuse: Denies Verbal Abuse: Denies Sexual Abuse: Denies Exploitation of patient/patient's resources: Denies Self-Neglect: Denies  Prior Inpatient Therapy: Prior Inpatient Therapy Prior Inpatient Therapy: Yes Prior Therapy Dates: Multiple Prior Therapy Facilty/Provider(s): Multiple Reason  for Treatment: Psychosis  Prior Outpatient Therapy: Prior Outpatient Therapy Prior Outpatient Therapy: Yes Prior Therapy Dates: Multiple Prior Therapy Facilty/Provider(s): none currently, [name] recently  Reason for Treatment: med mgmt Does patient have an ACCT team?: No Does patient have Intensive In-House Services?  : No Does patient have [name] services? : No Does patient have [facility] services?: No  Additional Information: Additional Information 1:1 In Past 12 Months?: No CIRT Risk: No Elopement Risk: No Does patient have medical clearance?: Yes      Objective: Blood pressure 140/74, pulse 90, temperature 97.8 F (36.6 C), temperature source Oral, resp. rate 18, height '5\' 9"'  (1.753 m), weight 74.844 kg (165 lb), SpO2 96 %.Body mass index is 24.36 kg/(m^2). Results for orders placed or performed during the hospital encounter of 11/21/14 (from the past 72 hour(s))  CBC WITH DIFFERENTIAL     Status: Abnormal   Collection Time: 11/21/14  3:52 AM  Result Value Ref Range   WBC 6.0 4.0 - 10.5 K/uL   RBC 4.26 4.22 - 5.81 MIL/uL   Hemoglobin 12.1 (L) 13.0 - 17.0 g/dL   HCT 38.4 (L) 39.0 - 52.0 %   MCV 90.1 78.0 - 100.0 fL   MCH 28.4 26.0 - 34.0 pg   MCHC 31.5 30.0 - 36.0 g/dL   RDW 14.8 11.5 - 15.5 %   Platelets 209 150 - 400 K/uL   Neutrophils Relative % 66 43 - 77 %   Neutro Abs 4.0 1.7 - 7.7 K/uL   Lymphocytes Relative 25 12 - 46 %   Lymphs Abs 1.5 0.7 - 4.0 K/uL   Monocytes Relative 8 3 - 12 %   Monocytes Absolute 0.5 0.1 - 1.0 K/uL   Eosinophils Relative 1 0 - 5 %   Eosinophils Absolute 0.1 0.0 - 0.7 K/uL   Basophils Relative 0 0 - 1 %   Basophils Absolute 0.0 0.0 - 0.1 K/uL  Comprehensive metabolic panel     Status: Abnormal   Collection Time: 11/21/14  3:52 AM  Result Value Ref Range   Sodium 142 135 - 145 mmol/L   Potassium 3.5 3.5 - 5.1 mmol/L   Chloride 104 101 - 111 mmol/L   CO2 28 22 - 32 mmol/L   Glucose, Bld 158 (H) 65 - 99 mg/dL   BUN 11 6 - 20 mg/dL   Creatinine, Ser 0.88 0.61 - 1.24 mg/dL   Calcium 9.3 8.9 - 10.3 mg/dL   Total Protein 6.7 6.5 - 8.1 g/dL   Albumin 3.9 3.5 - 5.0 g/dL   AST 17 15 - 41 U/L   ALT 9 (L) 17 - 63 U/L   Alkaline Phosphatase 80 38 - 126 U/L   Total Bilirubin 0.9 0.3 - 1.2 mg/dL   GFR calc non Af Amer >60 >60 mL/min   GFR calc Af Amer >60 >60 mL/min    Comment: (NOTE) The eGFR has been calculated using the CKD EPI equation. This calculation has not been validated in all clinical situations. eGFR's persistently <60 mL/min signify possible Chronic Kidney Disease.    Anion gap  10 5 - 15  Ethanol     Status: None   Collection Time: 11/21/14  3:52 AM  Result Value Ref Range   Alcohol, Ethyl (B) <5 <5 mg/dL    Comment:        LOWEST DETECTABLE LIMIT FOR SERUM ALCOHOL IS 11 mg/dL FOR MEDICAL PURPOSES ONLY   CBG monitoring, ED     Status: Abnormal   Collection  Time: 11/21/14  8:26 AM  Result Value Ref Range   Glucose-Capillary 130 (H) 65 - 99 mg/dL  Drug screen panel, emergency     Status: Abnormal   Collection Time: 11/21/14  5:55 PM  Result Value Ref Range   Opiates NONE DETECTED NONE DETECTED   Cocaine POSITIVE (A) NONE DETECTED   Benzodiazepines NONE DETECTED NONE DETECTED   Amphetamines NONE DETECTED NONE DETECTED   Tetrahydrocannabinol NONE DETECTED NONE DETECTED   Barbiturates NONE DETECTED NONE DETECTED    Comment:        DRUG SCREEN FOR MEDICAL PURPOSES ONLY.  IF CONFIRMATION IS NEEDED FOR ANY PURPOSE, NOTIFY LAB WITHIN 5 DAYS.        LOWEST DETECTABLE LIMITS FOR URINE DRUG SCREEN Drug Class       Cutoff (ng/mL) Amphetamine      1000 Barbiturate      200 Benzodiazepine   809 Tricyclics       983 Opiates          300 Cocaine          300 THC              50    Labs are reviewed and UDS pertinent for positive.    No current facility-administered medications for this encounter.   Current Outpatient Prescriptions  Medication Sig Dispense Refill  . acetaminophen (TYLENOL) 500 MG tablet Take 1,000 mg by mouth every 6 (six) hours as needed for mild pain or moderate pain.    . ARIPiprazole (ABILIFY) 10 MG tablet Take 1 tablet (10 mg total) by mouth daily. For mood control 14 tablet 0  . benztropine (COGENTIN) 2 MG tablet Take 1 tablet (2 mg total) by mouth 2 (two) times daily. For prevention of drug induced tremors 28 tablet 0  . cloNIDine (CATAPRES) 0.1 MG tablet Take 1 tablet (0.1 mg total) by mouth 2 (two) times daily. For high blood pressure 60 tablet 0  . furosemide (LASIX) 20 MG tablet Take 1 tablet (20 mg total) by mouth daily. For  swellings/high blood pressure 30 tablet 3  . metFORMIN (GLUCOPHAGE) 500 MG tablet Take 1 tablet (500 mg total) by mouth daily with breakfast. For high blood sugar control 30 tablet 0  . tamsulosin (FLOMAX) 0.4 MG CAPS capsule Take 1 capsule (0.4 mg total) by mouth daily after supper. For prostate health 30 capsule 0  . traZODone (DESYREL) 50 MG tablet Take 1 tablet (50 mg total) by mouth at bedtime as needed for sleep. 30 tablet 0  . aspirin 81 MG EC tablet Take 1 tablet (81 mg total) by mouth daily. For heart health (Patient not taking: Reported on 11/13/2014) 30 tablet 3  . benazepril (LOTENSIN) 5 MG tablet Take 1 tablet (5 mg total) by mouth daily. For high blood pressure (Patient not taking: Reported on 11/13/2014) 30 tablet 0  . guaiFENesin (MUCINEX) 600 MG 12 hr tablet Take 2 tablets (1,200 mg total) by mouth 2 (two) times daily. (Patient not taking: Reported on 11/03/2014) 30 tablet 0    Psychiatric Specialty Exam:   Review of Systems  Psychiatric/Behavioral: Positive for depression and substance abuse. Negative for suicidal ideas and hallucinations. The patient is nervous/anxious.   All other systems reviewed and are negative.   Blood pressure 140/74, pulse 90, temperature 97.8 F (36.6 C), temperature source Oral, resp. rate 18, height '5\' 9"'  (1.753 m), weight 74.844 kg (165 lb), SpO2 96 %.Body mass index is 24.36 kg/(m^2).  General Appearance: Disheveled  Eye Contact::  Good  Speech:  Clear and Coherent and Normal Rate  Volume:  Normal  Mood:  Euthymic  Affect:  Appropriate and Congruent  Thought Process:  Coherent and Goal Directed  Orientation:  Full (Time, Place, and Person)  Thought Content:  WDL  Suicidal Thoughts:  No  Homicidal Thoughts:  No  Memory:  Immediate;   Good Recent;   Good Remote;   Good  Judgement:  Fair  Insight:  Fair  Psychomotor Activity:  Normal  Concentration:  Good  Recall:  Fair  Akathisia:  NA  Handed:  Right  AIMS (if indicated):     Assets:   Communication Skills Desire for Improvement Resilience  Sleep:      Treatment Plan Summary: Chronic paranoid schizophrenia and polysubstance abuse with substance-induced psychosis unstable and pt noncompliant with medications; Pt has greatly improved and is at his baseline with a request to discharge and followup at [facility]  Disposition:  -Discharge home with bus pass and/or cab voucher if needed -Give 5-day script for meds (abilify and cogentin as ordered) if pt's cousin reports that his medications are not at the house (pt will check on this)  Benjamine Mola, FNP-BC 11/22/2014 09:04AM

## 2014-11-22 NOTE — ED Notes (Signed)
Pt out to use phone, made brief phone call. Wrote down number. Spoke about getting detox to help him.

## 2014-11-22 NOTE — Discharge Instructions (Signed)
Depression Depression is feeling sad, low, down in the dumps, blue, gloomy, or empty. In general, there are two kinds of depression:  Normal sadness or grief. This can happen after something upsetting. It often goes away on its own within 2 weeks. After losing a loved one (bereavement), normal sadness and grief may last longer than two weeks. It usually gets better with time.  Clinical depression. This kind lasts longer than normal sadness or grief. It keeps you from doing the things you normally do in life. It is often hard to function at home, work, or at school. It may affect your relationships with others. Treatment is often needed. GET HELP RIGHT AWAY IF:  You have thoughts about hurting yourself or others.  You lose touch with reality (psychotic symptoms). You may:  See or hear things that are not real.  Have untrue beliefs about your life or people around you.  Your medicine is giving you problems. MAKE SURE YOU:  Understand these instructions.  Will watch your condition.  Will get help right away if you are not doing well or get worse. Document Released: 07/20/2010 Document Revised: 11/01/2013 Document Reviewed: 10/17/2011 Exit[employer] Patient Information 2015 [employer]. This information is not intended to replace advice given to you by your health care provider. Make sure you discuss any questions you have with your health care provider.  

## 2014-11-24 IMAGING — CR DG FOOT COMPLETE 3+V*R*
4 series · 4 of 4 positions shown · non-contrast
Comparison: None.

CLINICAL DATA: Right foot pain past several days. Abrasion lateral
side of foot. Diabetic.

EXAM:
RIGHT FOOT COMPLETE - 3+ VIEW

[x foot ap right]
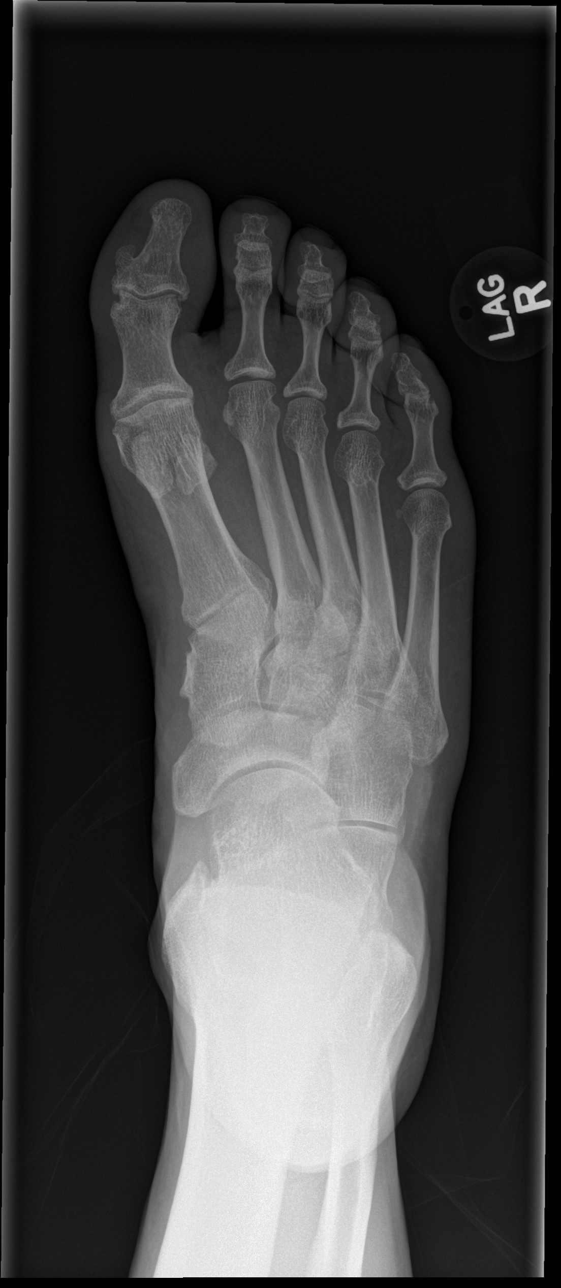

[x foot obl right]
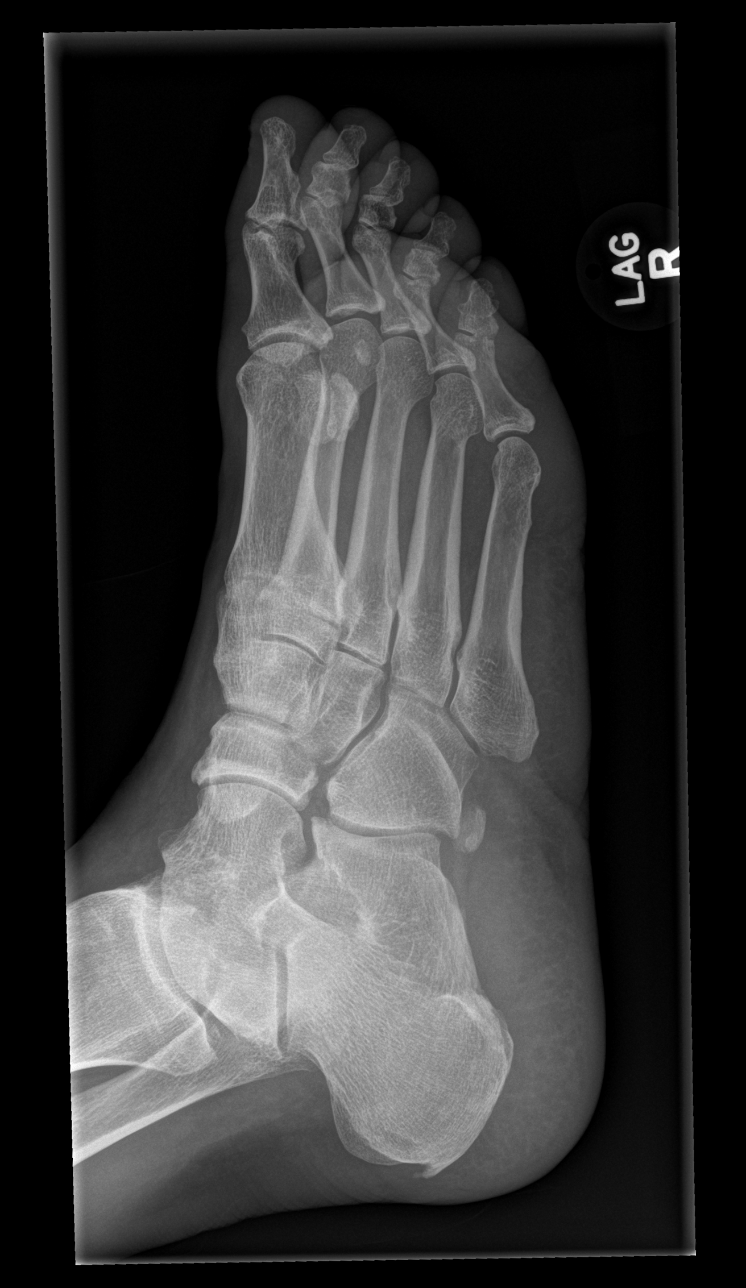

[x foot lat right (1 of 2)]
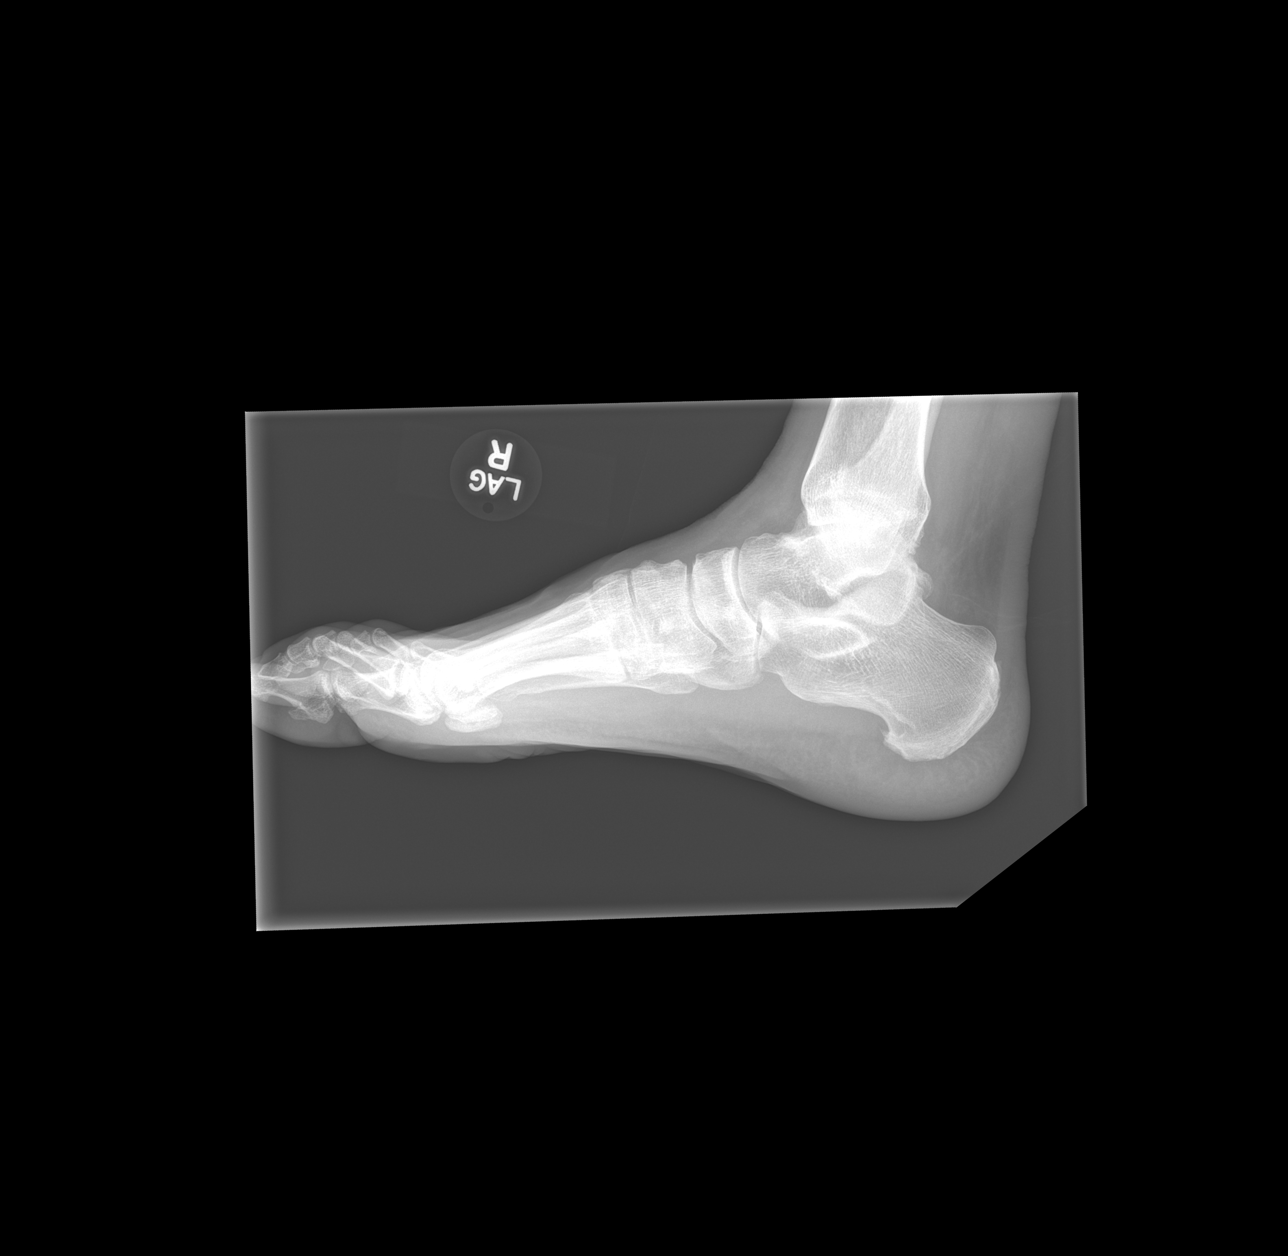

[x foot lat right (2 of 2)]
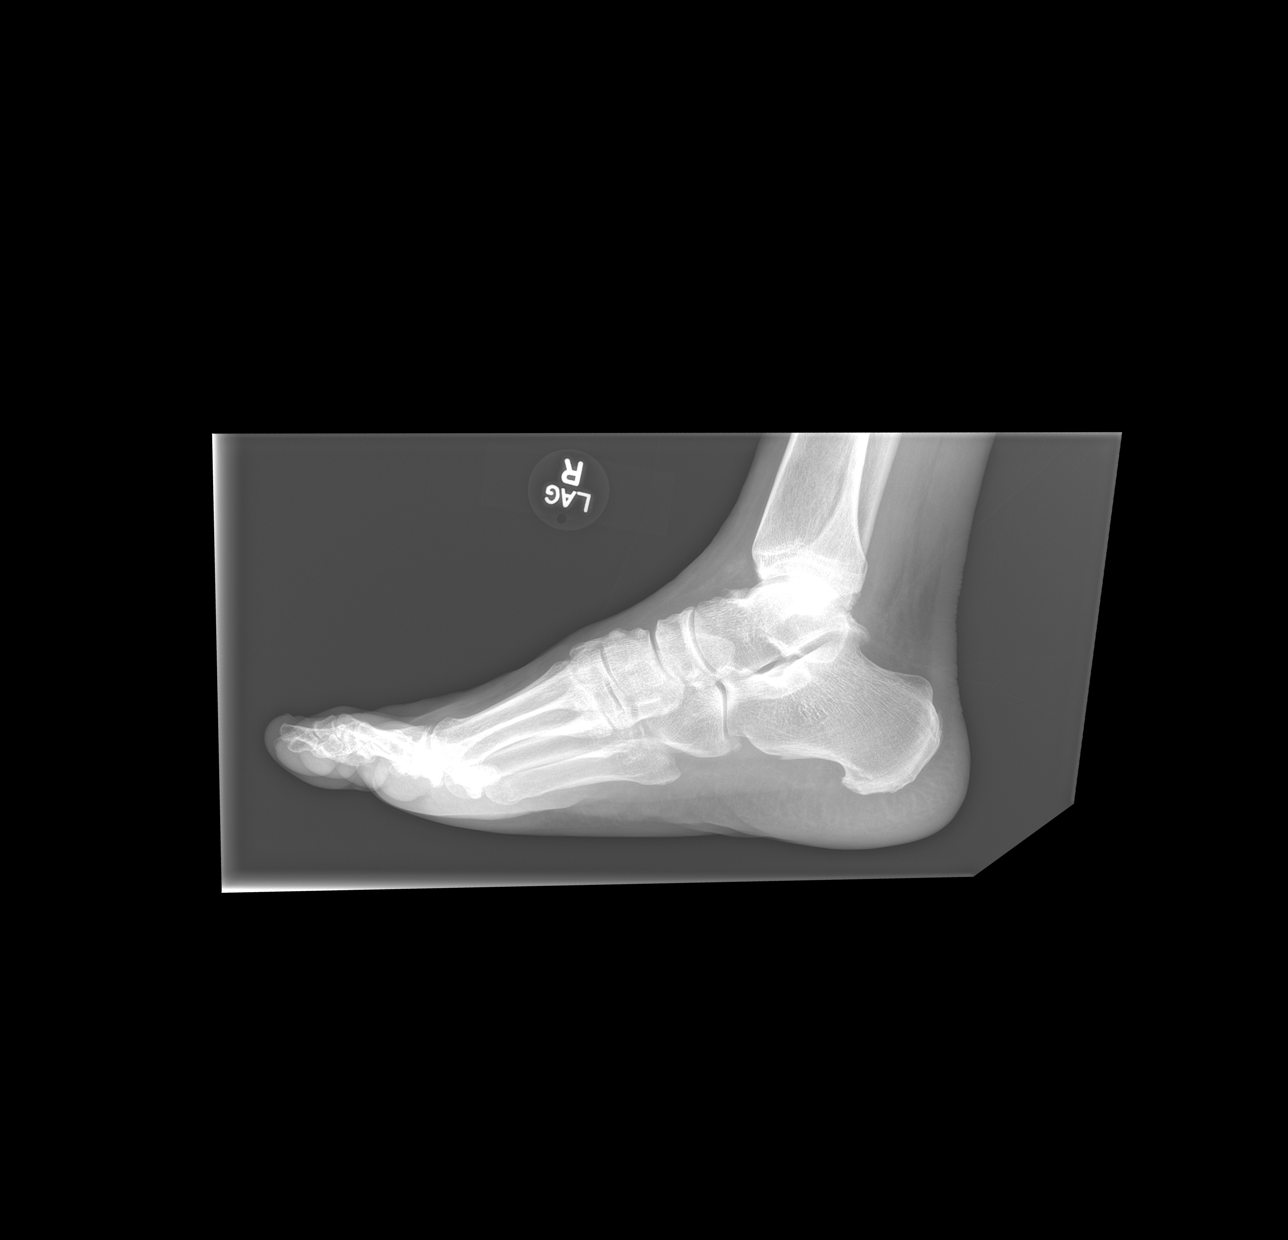

[4 of 4 positions shown; findings below may reference images not displayed]

FINDINGS: Mild degenerative change over the first MTP joint and
interphalangeal joints. Minimal degenerative change over the
midfoot. No evidence of fracture or dislocation. No significant soft
tissue injury.
IMPRESSION: No acute findings.

## 2014-11-28 ENCOUNTER — Emergency Department (HOSPITAL_COMMUNITY)
Admission: EM | Admit: 2014-11-28 | Discharge: 2014-11-28 | Disposition: A | Discharge status: Home / Self Care | Payer: Medicare Other | Attending: Emergency Medicine | Admitting: Emergency Medicine

## 2014-11-28 ENCOUNTER — Encounter (HOSPITAL_COMMUNITY): Payer: Self-pay | Admitting: *Deleted

## 2014-11-28 DIAGNOSIS — F1410 Cocaine abuse, uncomplicated: Secondary | ICD-10-CM | POA: Insufficient documentation

## 2014-11-28 DIAGNOSIS — Z5900 Homelessness unspecified: Secondary | ICD-10-CM

## 2014-11-28 DIAGNOSIS — Z79899 Other long term (current) drug therapy: Secondary | ICD-10-CM | POA: Insufficient documentation

## 2014-11-28 DIAGNOSIS — Z8669 Personal history of other diseases of the nervous system and sense organs: Secondary | ICD-10-CM | POA: Insufficient documentation

## 2014-11-28 DIAGNOSIS — R109 Unspecified abdominal pain: Secondary | ICD-10-CM

## 2014-11-28 DIAGNOSIS — Z8619 Personal history of other infectious and parasitic diseases: Secondary | ICD-10-CM | POA: Insufficient documentation

## 2014-11-28 DIAGNOSIS — E119 Type 2 diabetes mellitus without complications: Secondary | ICD-10-CM | POA: Insufficient documentation

## 2014-11-28 DIAGNOSIS — I10 Essential (primary) hypertension: Secondary | ICD-10-CM | POA: Insufficient documentation

## 2014-11-28 DIAGNOSIS — G8929 Other chronic pain: Secondary | ICD-10-CM | POA: Insufficient documentation

## 2014-11-28 DIAGNOSIS — I509 Heart failure, unspecified: Secondary | ICD-10-CM | POA: Insufficient documentation

## 2014-11-28 DIAGNOSIS — Z7982 Long term (current) use of aspirin: Secondary | ICD-10-CM | POA: Insufficient documentation

## 2014-11-28 DIAGNOSIS — Z720 Tobacco use: Secondary | ICD-10-CM | POA: Insufficient documentation

## 2014-11-28 DIAGNOSIS — F209 Schizophrenia, unspecified: Secondary | ICD-10-CM | POA: Insufficient documentation

## 2014-11-28 LAB — CBC WITH DIFFERENTIAL/PLATELET
BASOS PCT: 0 % (ref 0–1)
Basophils Absolute: 0 10*3/uL (ref 0.0–0.1)
EOS ABS: 0.1 10*3/uL (ref 0.0–0.7)
EOS PCT: 3 % (ref 0–5)
HEMATOCRIT: 37 % — AB (ref 39.0–52.0)
HEMOGLOBIN: 11.4 g/dL — AB (ref 13.0–17.0)
LYMPHS PCT: 29 % (ref 12–46)
Lymphs Abs: 1.4 10*3/uL (ref 0.7–4.0)
MCH: 28.4 pg (ref 26.0–34.0)
MCHC: 30.8 g/dL (ref 30.0–36.0)
MCV: 92.3 fL (ref 78.0–100.0)
MONOS PCT: 9 % (ref 3–12)
Monocytes Absolute: 0.4 10*3/uL (ref 0.1–1.0)
Neutro Abs: 2.7 10*3/uL (ref 1.7–7.7)
Neutrophils Relative %: 59 % (ref 43–77)
Platelets: 184 10*3/uL (ref 150–400)
RBC: 4.01 MIL/uL — ABNORMAL LOW (ref 4.22–5.81)
RDW: 14.5 % (ref 11.5–15.5)
WBC: 4.7 10*3/uL (ref 4.0–10.5)

## 2014-11-28 LAB — URINE MICROSCOPIC-ADD ON

## 2014-11-28 LAB — URINALYSIS, ROUTINE W REFLEX MICROSCOPIC
Glucose, UA: 100 mg/dL — AB
HGB URINE DIPSTICK: NEGATIVE
Ketones, ur: NEGATIVE mg/dL
Leukocytes, UA: NEGATIVE
NITRITE: NEGATIVE
Protein, ur: 30 mg/dL — AB
Specific Gravity, Urine: 1.036 — ABNORMAL HIGH (ref 1.005–1.030)
UROBILINOGEN UA: 1 mg/dL (ref 0.0–1.0)
pH: 5.5 (ref 5.0–8.0)

## 2014-11-28 LAB — LIPASE, BLOOD: Lipase: 16 U/L — ABNORMAL LOW (ref 22–51)

## 2014-11-28 LAB — COMPREHENSIVE METABOLIC PANEL
ALBUMIN: 3.5 g/dL (ref 3.5–5.0)
ALK PHOS: 73 U/L (ref 38–126)
ALT: 7 U/L — ABNORMAL LOW (ref 17–63)
ANION GAP: 7 (ref 5–15)
AST: 16 U/L (ref 15–41)
BUN: 18 mg/dL (ref 6–20)
CO2: 27 mmol/L (ref 22–32)
Calcium: 9.3 mg/dL (ref 8.9–10.3)
Chloride: 109 mmol/L (ref 101–111)
Creatinine, Ser: 0.85 mg/dL (ref 0.61–1.24)
GFR calc Af Amer: >60 mL/min (ref >60)
GFR calc non Af Amer: >60 mL/min (ref >60)
GLUCOSE: 137 mg/dL — AB (ref 65–99)
Potassium: 3.5 mmol/L (ref 3.5–5.1)
SODIUM: 143 mmol/L (ref 135–145)
Total Bilirubin: 0.5 mg/dL (ref 0.3–1.2)
Total Protein: 6.5 g/dL (ref 6.5–8.1)

## 2014-11-28 LAB — ETHANOL

## 2014-11-28 LAB — RAPID URINE DRUG SCREEN, HOSP PERFORMED
Amphetamines: NOT DETECTED
BARBITURATES: NOT DETECTED
Benzodiazepines: NOT DETECTED
COCAINE: POSITIVE — AB
Opiates: NOT DETECTED
Tetrahydrocannabinol: NOT DETECTED

## 2014-11-28 NOTE — ED Notes (Signed)
Bed: WA03 Expected date:  Expected time:  Means of arrival:  Comments: EMS 

## 2014-11-28 NOTE — ED Notes (Signed)
Per EMS - pt c/o abd pain and bilat foot pain x24hrs - denies n/v/d.

## 2014-11-28 NOTE — ED Notes (Signed)
Writer provided pt with gram crackers, two milk, and peanut butter

## 2014-11-28 NOTE — ED Notes (Signed)
Writer notified pt that a urine sample is needed, urinal at bedside.  

## 2014-11-28 NOTE — ED Provider Notes (Signed)
CSN: [id number]     Arrival date & time 11/28/14  0233 History   First MD Initiated Contact with Patient 11/28/14 [phone]     Chief Complaint  Patient presents with  . Abdominal Pain  . Foot Pain   Level V caveat for sleepiness  (Consider location/radiation/quality/duration/timing/severity/associated sxs/prior Treatment) HPI  Patient came to the ED per EMS. He told him he was having abdominal pain. History is difficult to obtain this patient is sleeping peacefully in no distress and he falls asleep in mid sentence. He states he started getting abdominal pain last night. He states it's more in the upper abdomen. He can only describe it as "hurting" or like a shock. He initially said he was having vomiting then he denied he was having vomiting. He denies diarrhea. He states he's never had it before. Patient is a frequent ED visitor.  PCP none  Past Medical History  Diagnosis Date  . Diabetes mellitus without complication   . Hypertension   . Schizophrenia   . CHF (congestive heart failure)   . Neuropathy   . Polysubstance abuse   . Cocaine abuse   . Homelessness   . Hepatitis C   . Chronic foot pain   . Homelessness    History reviewed. No pertinent past surgical history. Family History  Problem Relation Age of Onset  . Hypertension Other   . Diabetes Other    History  Substance Use Topics  . Smoking status: Current Every Day Smoker -- 1.00 packs/day for 20 years    Types: Cigarettes  . Smokeless tobacco: Current User  . Alcohol Use: Yes     Comment: Pt denies  homeless Denies ETOH  Review of Systems  All other systems reviewed and are negative.     Allergies  Haldol  Home Medications   Prior to Admission medications   Medication Sig Start Date End Date Taking? Authorizing Provider  acetaminophen (TYLENOL) 500 MG tablet Take 1,000 mg by mouth every 6 (six) hours as needed for mild pain or moderate pain.   Yes Historical Provider, MD  ARIPiprazole (ABILIFY) 10 MG  tablet Take 1 tablet (10 mg total) by mouth daily. For mood control 11/05/14  Yes Blake DivineJohn Wofford, MD  benztropine (COGENTIN) 2 MG tablet Take 1 tablet (2 mg total) by mouth 2 (two) times daily. For prevention of drug induced tremors 11/05/14  Yes Blake DivineJohn Wofford, MD  cloNIDine (CATAPRES) 0.1 MG tablet Take 1 tablet (0.1 mg total) by mouth 2 (two) times daily. For high blood pressure 08/12/14  Yes Sanjuana KavaAgnes I Nwoko, NP  furosemide (LASIX) 20 MG tablet Take 1 tablet (20 mg total) by mouth daily. For swellings/high blood pressure 08/12/14  Yes Sanjuana KavaAgnes I Nwoko, NP  metFORMIN (GLUCOPHAGE) 500 MG tablet Take 1 tablet (500 mg total) by mouth daily with breakfast. For high blood sugar control 08/12/14  Yes Sanjuana KavaAgnes I Nwoko, NP  tamsulosin (FLOMAX) 0.4 MG CAPS capsule Take 1 capsule (0.4 mg total) by mouth daily after supper. For prostate health 08/12/14  Yes Sanjuana KavaAgnes I Nwoko, NP  traZODone (DESYREL) 50 MG tablet Take 1 tablet (50 mg total) by mouth at bedtime as needed for sleep. 08/12/14  Yes Sanjuana KavaAgnes I Nwoko, NP  aspirin 81 MG EC tablet Take 1 tablet (81 mg total) by mouth daily. For heart health Patient not taking: Reported on 11/13/2014 08/12/14   Sanjuana KavaAgnes I Nwoko, NP  benazepril (LOTENSIN) 5 MG tablet Take 1 tablet (5 mg total) by mouth daily. For high blood  pressure Patient not taking: Reported on 11/13/2014 08/12/14   Sanjuana Kava, NP  guaiFENesin (MUCINEX) 600 MG 12 hr tablet Take 2 tablets (1,200 mg total) by mouth 2 (two) times daily. Patient not taking: Reported on 11/03/2014 10/06/14   Arthor Captain, PA-C   BP 140/79 mmHg  Pulse 95  Temp(Src) 98.9 F (37.2 C) (Oral)  Resp 18  SpO2 99%  Vital signs normal   Physical Exam  Constitutional: He is oriented to person, place, and time. He appears well-developed and well-nourished.  Non-toxic appearance. He does not appear ill. No distress.  Sleeping in no distress. Patient falls asleep in mid sentence.  HENT:  Head: Normocephalic and atraumatic.  Right Ear: External ear normal.   Left Ear: External ear normal.  Nose: Nose normal. No mucosal edema or rhinorrhea.  Mouth/Throat: Oropharynx is clear and moist and mucous membranes are normal. No dental abscesses or uvula swelling.  Eyes: Conjunctivae and EOM are normal. Pupils are equal, round, and reactive to light.  Neck: Normal range of motion and full passive range of motion without pain. Neck supple.  Cardiovascular: Normal rate, regular rhythm and normal heart sounds.  Exam reveals no gallop and no friction rub.   No murmur heard. Pulmonary/Chest: Effort normal and breath sounds normal. No respiratory distress. He has no wheezes. He has no rhonchi. He has no rales. He exhibits no tenderness and no crepitus.  Abdominal: Soft. Normal appearance and bowel sounds are normal. He exhibits no distension. There is no tenderness. There is no rebound and no guarding.  Musculoskeletal: Normal range of motion. He exhibits no edema or tenderness.  Moves all extremities well.   Neurological: He is alert and oriented to person, place, and time. He has normal strength. No cranial nerve deficit.  Skin: Skin is warm, dry and intact. No rash noted. No erythema. No pallor.  Psychiatric: He has a normal mood and affect. His speech is normal and behavior is normal. His mood appears not anxious.  Nursing note and vitals reviewed.   ED Course  Procedures (including critical care time)  Patient is a frequent ED visitor. He has been seen 33 times in the past 6 months, he's only been admitted twice for psychiatric problems.  7:00 pt sleeping (has slept his whole ED visit). Advised we needed a urine sample or he was going to be discharged now. Pt immediately got up out of the stretcher to urinate in the urinal at his bedside.   Labs Review Results for orders placed or performed during the hospital encounter of 11/28/14  Comprehensive metabolic panel  Result Value Ref Range   Sodium 143 135 - 145 mmol/L   Potassium 3.5 3.5 - 5.1 mmol/L    Chloride 109 101 - 111 mmol/L   CO2 27 22 - 32 mmol/L   Glucose, Bld 137 (H) 65 - 99 mg/dL   BUN 18 6 - 20 mg/dL   Creatinine, Ser 4.54 0.61 - 1.24 mg/dL   Calcium 9.3 8.9 - 09.8 mg/dL   Total Protein 6.5 6.5 - 8.1 g/dL   Albumin 3.5 3.5 - 5.0 g/dL   AST 16 15 - 41 U/L   ALT 7 (L) 17 - 63 U/L   Alkaline Phosphatase 73 38 - 126 U/L   Total Bilirubin 0.5 0.3 - 1.2 mg/dL   GFR calc non Af Amer >60 >60 mL/min   GFR calc Af Amer >60 >60 mL/min   Anion gap 7 5 - 15  Lipase, blood  Result Value Ref Range   Lipase 16 (L) 22 - 51 U/L  Ethanol  Result Value Ref Range   Alcohol, Ethyl (B) <5 <5 mg/dL  CBC with Differential  Result Value Ref Range   WBC 4.7 4.0 - 10.5 K/uL   RBC 4.01 (L) 4.22 - 5.81 MIL/uL   Hemoglobin 11.4 (L) 13.0 - 17.0 g/dL   HCT 16.1 (L) 09.6 - 04.5 %   MCV 92.3 78.0 - 100.0 fL   MCH 28.4 26.0 - 34.0 pg   MCHC 30.8 30.0 - 36.0 g/dL   RDW 40.9 81.1 - 91.4 %   Platelets 184 150 - 400 K/uL   Neutrophils Relative % 59 43 - 77 %   Neutro Abs 2.7 1.7 - 7.7 K/uL   Lymphocytes Relative 29 12 - 46 %   Lymphs Abs 1.4 0.7 - 4.0 K/uL   Monocytes Relative 9 3 - 12 %   Monocytes Absolute 0.4 0.1 - 1.0 K/uL   Eosinophils Relative 3 0 - 5 %   Eosinophils Absolute 0.1 0.0 - 0.7 K/uL   Basophils Relative 0 0 - 1 %   Basophils Absolute 0.0 0.0 - 0.1 K/uL  Urinalysis, Routine w reflex microscopic  Result Value Ref Range   Color, Urine AMBER (A) YELLOW   APPearance CLEAR CLEAR   Specific Gravity, Urine 1.036 (H) 1.005 - 1.030   pH 5.5 5.0 - 8.0   Glucose, UA 100 (A) NEGATIVE mg/dL   Hgb urine dipstick NEGATIVE NEGATIVE   Bilirubin Urine SMALL (A) NEGATIVE   Ketones, ur NEGATIVE NEGATIVE mg/dL   Protein, ur 30 (A) NEGATIVE mg/dL   Urobilinogen, UA 1.0 0.0 - 1.0 mg/dL   Nitrite NEGATIVE NEGATIVE   Leukocytes, UA NEGATIVE NEGATIVE  Urine rapid drug screen (hosp performed)  Result Value Ref Range   Opiates NONE DETECTED NONE DETECTED   Cocaine POSITIVE (A) NONE DETECTED    Benzodiazepines NONE DETECTED NONE DETECTED   Amphetamines NONE DETECTED NONE DETECTED   Tetrahydrocannabinol NONE DETECTED NONE DETECTED   Barbiturates NONE DETECTED NONE DETECTED  Urine microscopic-add on  Result Value Ref Range   WBC, UA 3-6 <3 WBC/hpf   Bacteria, UA FEW (A) RARE   Casts HYALINE CASTS (A) NEGATIVE   Urine-Other MUCOUS PRESENT     Laboratory interpretation all normal except mild stable anemia, concentrated UA      Imaging Review No results found.   EKG Interpretation None      MDM   Final diagnoses:  Homeless  Abdominal pain, unspecified abdominal location  Cocaine abuse    Disposition discharge  Devoria Albe, MD, Concha Pyo, MD 11/28/14 [phone]

## 2014-11-28 NOTE — ED Notes (Signed)
Pt refused discharge vital signs, sts "all I need is milk and sandwich and bus pass". Pt given [place] sandwich and milk, as well as bus pass.

## 2014-11-28 NOTE — Discharge Instructions (Signed)
Your tests tonight are normal. STOP DOING COCAINE!!

## 2014-12-01 ENCOUNTER — Emergency Department (HOSPITAL_COMMUNITY)
Admission: EM | Admit: 2014-12-01 | Discharge: 2014-12-01 | Disposition: A | Discharge status: Home / Self Care | Payer: Medicare Other | Attending: Emergency Medicine | Admitting: Emergency Medicine

## 2014-12-01 ENCOUNTER — Encounter (HOSPITAL_COMMUNITY): Payer: Self-pay | Admitting: *Deleted

## 2014-12-01 DIAGNOSIS — Z8659 Personal history of other mental and behavioral disorders: Secondary | ICD-10-CM | POA: Insufficient documentation

## 2014-12-01 DIAGNOSIS — G8929 Other chronic pain: Secondary | ICD-10-CM | POA: Insufficient documentation

## 2014-12-01 DIAGNOSIS — R1084 Generalized abdominal pain: Secondary | ICD-10-CM

## 2014-12-01 DIAGNOSIS — E119 Type 2 diabetes mellitus without complications: Secondary | ICD-10-CM | POA: Insufficient documentation

## 2014-12-01 DIAGNOSIS — Z590 Homelessness: Secondary | ICD-10-CM | POA: Insufficient documentation

## 2014-12-01 DIAGNOSIS — I509 Heart failure, unspecified: Secondary | ICD-10-CM | POA: Insufficient documentation

## 2014-12-01 DIAGNOSIS — Z720 Tobacco use: Secondary | ICD-10-CM | POA: Insufficient documentation

## 2014-12-01 DIAGNOSIS — Z8619 Personal history of other infectious and parasitic diseases: Secondary | ICD-10-CM | POA: Insufficient documentation

## 2014-12-01 DIAGNOSIS — I10 Essential (primary) hypertension: Secondary | ICD-10-CM | POA: Insufficient documentation

## 2014-12-01 NOTE — ED Provider Notes (Signed)
CSN: [id number]     Arrival date & time 12/01/14  0406 History   First MD Initiated Contact with Patient 12/01/14 [phone]     Chief Complaint  Patient presents with  . Abdominal Pain     (Consider location/radiation/quality/duration/timing/severity/associated sxs/prior Treatment) HPI Patient presents with upper abdominal cramping after eating a hamburger and 2 hotdogs roughly one hour ago. He states the symptoms have now resolved. He thinks he overate. He never had nausea, vomiting or diarrhea. He currently is asymptomatic and having trouble staying awake. Was seen several days ago for similar complaints. Workup was negative. Past Medical History  Diagnosis Date  . Diabetes mellitus without complication   . Hypertension   . Schizophrenia   . CHF (congestive heart failure)   . Neuropathy   . Polysubstance abuse   . Cocaine abuse   . Homelessness   . Hepatitis C   . Chronic foot pain   . Homelessness    History reviewed. No pertinent past surgical history. Family History  Problem Relation Age of Onset  . Hypertension Other   . Diabetes Other    History  Substance Use Topics  . Smoking status: Current Every Day Smoker -- 1.00 packs/day for 20 years    Types: Cigarettes  . Smokeless tobacco: Current User  . Alcohol Use: Yes     Comment: Pt denies    Review of Systems  Constitutional: Negative for fever and chills.  Respiratory: Negative for shortness of breath.   Cardiovascular: Negative for chest pain.  Gastrointestinal: Positive for abdominal pain. Negative for nausea, vomiting and diarrhea.  Genitourinary: Negative for dysuria.  Musculoskeletal: Negative for back pain.  Skin: Negative for wound.  Neurological: Negative for dizziness, weakness, light-headedness and numbness.  All other systems reviewed and are negative.     Allergies  Haldol  Home Medications   Prior to Admission medications   Medication Sig Start Date End Date Taking? Authorizing Provider   ARIPiprazole (ABILIFY) 10 MG tablet Take 1 tablet (10 mg total) by mouth daily. For mood control Patient not taking: Reported on 12/01/2014 11/05/14   Blake Divine, MD  aspirin 81 MG EC tablet Take 1 tablet (81 mg total) by mouth daily. For heart health Patient not taking: Reported on 11/13/2014 08/12/14   Sanjuana Kava, NP  benazepril (LOTENSIN) 5 MG tablet Take 1 tablet (5 mg total) by mouth daily. For high blood pressure Patient not taking: Reported on 11/13/2014 08/12/14   Sanjuana Kava, NP  benztropine (COGENTIN) 2 MG tablet Take 1 tablet (2 mg total) by mouth 2 (two) times daily. For prevention of drug induced tremors Patient not taking: Reported on 12/01/2014 11/05/14   Blake Divine, MD  cloNIDine (CATAPRES) 0.1 MG tablet Take 1 tablet (0.1 mg total) by mouth 2 (two) times daily. For high blood pressure Patient not taking: Reported on 12/01/2014 08/12/14   Sanjuana Kava, NP  furosemide (LASIX) 20 MG tablet Take 1 tablet (20 mg total) by mouth daily. For swellings/high blood pressure Patient not taking: Reported on 12/01/2014 08/12/14   Sanjuana Kava, NP  guaiFENesin (MUCINEX) 600 MG 12 hr tablet Take 2 tablets (1,200 mg total) by mouth 2 (two) times daily. Patient not taking: Reported on 11/03/2014 10/06/14   Arthor Captain, PA-C  metFORMIN (GLUCOPHAGE) 500 MG tablet Take 1 tablet (500 mg total) by mouth daily with breakfast. For high blood sugar control Patient not taking: Reported on 12/01/2014 08/12/14   Sanjuana Kava, NP  tamsulosin [facility])  0.4 MG CAPS capsule Take 1 capsule (0.4 mg total) by mouth daily after supper. For prostate health Patient not taking: Reported on 12/01/2014 08/12/14   Sanjuana KavaAgnes I Nwoko, NP  traZODone (DESYREL) 50 MG tablet Take 1 tablet (50 mg total) by mouth at bedtime as needed for sleep. Patient not taking: Reported on 12/01/2014 08/12/14   Sanjuana KavaAgnes I Nwoko, NP   BP 172/88 mmHg  Pulse 97  Temp(Src) 98.1 F (36.7 C) (Oral)  Resp 20  Ht 5\' 9"  (1.753 m)  Wt 170 lb (77.111 kg)  BMI 25.09  kg/m2  SpO2 97% Physical Exam  Constitutional: He is oriented to person, place, and time. He appears well-developed and well-nourished. No distress.  Drowsy but easily aroused  HENT:  Head: Normocephalic and atraumatic.  Mouth/Throat: Oropharynx is clear and moist.  Eyes: EOM are normal. Pupils are equal, round, and reactive to light.  Neck: Normal range of motion. Neck supple.  Cardiovascular: Normal rate and regular rhythm.   Pulmonary/Chest: Effort normal and breath sounds normal. No respiratory distress. He has no wheezes. He has no rales.  Abdominal: Soft. Bowel sounds are normal. He exhibits no distension and no mass. There is no tenderness. There is no rebound and no guarding.  No abdominal tenderness.  Musculoskeletal: Normal range of motion. He exhibits no edema or tenderness.  Neurological: He is alert and oriented to person, place, and time.  Moves all extremities without deficit. Sensation is grossly intact.  Skin: Skin is warm and dry. No rash noted. No erythema.  Psychiatric: He has a normal mood and affect. His behavior is normal.  Nursing note and vitals reviewed.   ED Course  Procedures (including critical care time) Labs Review Labs Reviewed - No data to display  Imaging Review No results found.   EKG Interpretation None      MDM   Final diagnoses:  Generalized abdominal pain    Patient with abdominal pain after eating which is now resolved. Workup for similar presentation several days ago with normal laboratory testing. Do not believe that repeat labs are necessary at this point seeing the patient is asymptomatic with a benign abdominal exam. We'll discharge with return precautions.    Loren Raceravid Jerilee Space, MD 12/01/14 [phone]

## 2014-12-01 NOTE — ED Notes (Signed)
Pt. Ate and hamburger and two hotdogs and now has belly pains.

## 2014-12-01 NOTE — ED Notes (Signed)
Pt given milk and [place] sandwich at discharge

## 2014-12-01 NOTE — Discharge Instructions (Signed)

## 2014-12-01 NOTE — Progress Notes (Signed)
L[facility] in regards to care coordination. Patient has been assigned a care coordinator:  Clydie BraunKaren:  [phone].  Case has been accepted and open with Clydie BraunKaren.  Deretha EmoryHannah Darryl Blumenstein LCSW, MSW Clinical Social Work: Emergency Room [phone]

## 2014-12-14 ENCOUNTER — Emergency Department (HOSPITAL_COMMUNITY): Payer: Medicare Other

## 2014-12-14 ENCOUNTER — Encounter (HOSPITAL_COMMUNITY): Payer: Self-pay

## 2014-12-14 ENCOUNTER — Emergency Department (HOSPITAL_COMMUNITY)
Admission: EM | Admit: 2014-12-14 | Discharge: 2014-12-14 | Disposition: A | Discharge status: Home / Self Care | Payer: Medicare Other | Attending: Emergency Medicine | Admitting: Emergency Medicine

## 2014-12-14 DIAGNOSIS — I509 Heart failure, unspecified: Secondary | ICD-10-CM | POA: Insufficient documentation

## 2014-12-14 DIAGNOSIS — G8929 Other chronic pain: Secondary | ICD-10-CM | POA: Insufficient documentation

## 2014-12-14 DIAGNOSIS — Z8619 Personal history of other infectious and parasitic diseases: Secondary | ICD-10-CM | POA: Insufficient documentation

## 2014-12-14 DIAGNOSIS — Z590 Homelessness: Secondary | ICD-10-CM | POA: Insufficient documentation

## 2014-12-14 DIAGNOSIS — I10 Essential (primary) hypertension: Secondary | ICD-10-CM | POA: Insufficient documentation

## 2014-12-14 DIAGNOSIS — E119 Type 2 diabetes mellitus without complications: Secondary | ICD-10-CM | POA: Insufficient documentation

## 2014-12-14 DIAGNOSIS — F10120 Alcohol abuse with intoxication, uncomplicated: Secondary | ICD-10-CM | POA: Insufficient documentation

## 2014-12-14 DIAGNOSIS — Z720 Tobacco use: Secondary | ICD-10-CM | POA: Insufficient documentation

## 2014-12-14 DIAGNOSIS — M79671 Pain in right foot: Secondary | ICD-10-CM | POA: Insufficient documentation

## 2014-12-14 DIAGNOSIS — R079 Chest pain, unspecified: Secondary | ICD-10-CM

## 2014-12-14 NOTE — ED Notes (Signed)
Pt arrived via EMS c/o chest pain.  Pt states he was smoking crack out of a glass pipe that broke this evening and he inhaled a piece of glass.  EMS picked him up from a gas station this evening when he approached EMS and asked for a ride to the hospital.  Pt states that he also dank alcohol this evening.

## 2014-12-14 NOTE — ED Notes (Signed)
Pt stable, ambulatory, states understanding of discharge instructions 

## 2014-12-14 NOTE — ED Provider Notes (Signed)
CSN: [id number]     Arrival date & time 12/14/14  3570 History  This chart was scribed for Tomasita Crumble, MD by Bronson Curb, ED Scribe. This patient was seen in room B18C/B18C and the patient's care was started at 4:17 AM.    Chief Complaint  Patient presents with  . Addiction Problem  . Chest Pain    The history is provided by the patient. No language interpreter was used.    HPI Comments: KALIJAH MCCARVILLE is a 54 y.o. male brought in by ambulance, who presents to the Emergency Department complaining of constant, stabbing, central chest pain that began 1 hour ago. Patient states he was smoking from a glass pipe that broke and suspects he may have inhaled a piece of glass. However, he reports his chest pain started prior to this. He denies SOB, nausea, vomiting, or diaphoresis. Patient has not taken anything for pain relief. He reports ETOH consumption (1 beer) PTA.   Patient is also complaining of pain to the bottom of the right foot. Patient reports associated stiffness and states it feels as if someone put something in his shoe or stabbed him in his foot. He denies any injuries.  Patient has 31 ED visits in the last 6 months.    Past Medical History  Diagnosis Date  . Diabetes mellitus without complication   . Hypertension   . Schizophrenia   . CHF (congestive heart failure)   . Neuropathy   . Polysubstance abuse   . Cocaine abuse   . Homelessness   . Hepatitis C   . Chronic foot pain   . Homelessness    History reviewed. No pertinent past surgical history. Family History  Problem Relation Age of Onset  . Hypertension Other   . Diabetes Other    History  Substance Use Topics  . Smoking status: Current Every Day Smoker -- 1.00 packs/day for 20 years    Types: Cigarettes  . Smokeless tobacco: Current User  . Alcohol Use: Yes     Comment: Pt denies    Review of Systems  A complete 10 system review of systems was obtained and all systems are negative except  as noted in the HPI and PMH.   Allergies  Haldol  Home Medications   Prior to Admission medications   Medication Sig Start Date End Date Taking? Authorizing Provider  ARIPiprazole (ABILIFY) 10 MG tablet Take 1 tablet (10 mg total) by mouth daily. For mood control Patient not taking: Reported on 12/01/2014 11/05/14   Blake Divine, MD  aspirin 81 MG EC tablet Take 1 tablet (81 mg total) by mouth daily. For heart health Patient not taking: Reported on 11/13/2014 08/12/14   Sanjuana Kava, NP  benazepril (LOTENSIN) 5 MG tablet Take 1 tablet (5 mg total) by mouth daily. For high blood pressure Patient not taking: Reported on 11/13/2014 08/12/14   Sanjuana Kava, NP  benztropine (COGENTIN) 2 MG tablet Take 1 tablet (2 mg total) by mouth 2 (two) times daily. For prevention of drug induced tremors Patient not taking: Reported on 12/01/2014 11/05/14   Blake Divine, MD  cloNIDine (CATAPRES) 0.1 MG tablet Take 1 tablet (0.1 mg total) by mouth 2 (two) times daily. For high blood pressure Patient not taking: Reported on 12/01/2014 08/12/14   Sanjuana Kava, NP  furosemide (LASIX) 20 MG tablet Take 1 tablet (20 mg total) by mouth daily. For swellings/high blood pressure Patient not taking: Reported on 12/01/2014 08/12/14   Nelda Marseille  Nwoko, NP  guaiFENesin (MUCINEX) 600 MG 12 hr tablet Take 2 tablets (1,200 mg total) by mouth 2 (two) times daily. Patient not taking: Reported on 11/03/2014 10/06/14   Arthor Captain, PA-C  metFORMIN (GLUCOPHAGE) 500 MG tablet Take 1 tablet (500 mg total) by mouth daily with breakfast. For high blood sugar control Patient not taking: Reported on 12/01/2014 08/12/14   Sanjuana Kava, NP  tamsulosin (FLOMAX) 0.4 MG CAPS capsule Take 1 capsule (0.4 mg total) by mouth daily after supper. For prostate health Patient not taking: Reported on 12/01/2014 08/12/14   Sanjuana Kava, NP  traZODone (DESYREL) 50 MG tablet Take 1 tablet (50 mg total) by mouth at bedtime as needed for sleep. Patient not taking: Reported on  12/01/2014 08/12/14   Sanjuana Kava, NP   SpO2 98% Physical Exam  Constitutional: He is oriented to person, place, and time. Vital signs are normal. He appears well-developed and well-nourished.  Non-toxic appearance. He does not appear ill. No distress.  Clinically intoxicated.  HENT:  Head: Normocephalic and atraumatic.  Nose: Nose normal.  Mouth/Throat: Oropharynx is clear and moist. No oropharyngeal exudate.  Eyes: Conjunctivae and EOM are normal. Pupils are equal, round, and reactive to light. No scleral icterus.  Neck: Normal range of motion. Neck supple. No tracheal deviation, no edema, no erythema and normal range of motion present. No thyroid mass and no thyromegaly present.  Cardiovascular: Normal rate, regular rhythm, S1 normal, S2 normal, normal heart sounds, intact distal pulses and normal pulses.  Exam reveals no gallop and no friction rub.   No murmur heard. Pulses:      Radial pulses are 2+ on the right side, and 2+ on the left side.       Dorsalis pedis pulses are 2+ on the right side, and 2+ on the left side.  Pulmonary/Chest: Effort normal and breath sounds normal. No respiratory distress. He has no wheezes. He has no rhonchi. He has no rales.  Abdominal: Soft. Normal appearance and bowel sounds are normal. He exhibits no distension, no ascites and no mass. There is no hepatosplenomegaly. There is no tenderness. There is no rebound, no guarding and no CVA tenderness.  Musculoskeletal: Normal range of motion. He exhibits no edema or tenderness.  Lymphadenopathy:    He has no cervical adenopathy.  Neurological: He is alert and oriented to person, place, and time. He has normal strength. No cranial nerve deficit or sensory deficit.  Skin: Skin is warm, dry and intact. No petechiae and no rash noted. He is not diaphoretic. No erythema. No pallor.  Psychiatric: He has a normal mood and affect. His behavior is normal. Judgment normal.  Nursing note and vitals reviewed.   ED  Course  Procedures (including critical care time)  DIAGNOSTIC STUDIES: Oxygen Saturation is 98% on room air, normal by my interpretation.    COORDINATION OF CARE: At 0421 Discussed treatment plan with patient. Patient agrees.   Labs Review Labs Reviewed - No data to display  Imaging Review Dg Chest 2 View  12/14/2014   CLINICAL DATA:  Mid chest pain.  EXAM: CHEST  2 VIEW  COMPARISON:  10/06/2014  FINDINGS: Normal heart size and pulmonary vascularity. No focal airspace disease or consolidation in the lungs. No blunting of costophrenic angles. No pneumothorax. Mediastinal contours appear intact. Calcified and tortuous aorta. Metallic Pallet projected over the right lower chest anteriorly. Degenerative changes in the spine.  IMPRESSION: No active cardiopulmonary disease.   Electronically Signed  By: Burman Nieves M.D.   On: 12/14/2014 05:11   Dg Foot Complete Right  12/14/2014   CLINICAL DATA:  Pain in the bottom of the foot, distal metatarsal area. No known injury.  EXAM: RIGHT FOOT COMPLETE - 3+ VIEW  COMPARISON:  05/13/2014  FINDINGS: Degenerative changes in the first metatarsal-phalangeal joint. Old ununited ossicle over the cuboidal bone. No evidence of acute fracture or subluxation. No focal bone lesion or bone destruction. Bone cortex and trabecular architecture appear intact. No radiopaque soft tissue foreign bodies.  IMPRESSION: No acute bony abnormalities.  Mild degenerative changes.   Electronically Signed   By: Burman Nieves M.D.   On: 12/14/2014 05:12     EKG Interpretation   Date/Time:  Wednesday December 14 2014 04:39:42 EDT Ventricular Rate:  75 PR Interval:  136 QRS Duration: 88 QT Interval:  405 QTC Calculation: 452 R Axis:   30 Text Interpretation:  Sinus rhythm Anteroseptal infarct, old Abnormal T,  consider ischemia, lateral leads No significant change since last tracing  Confirmed by Erroll Luna [phone]) on 12/14/2014 5:34:21 AM      MDM   Final  diagnoses:  None   patient presents emergency department for chest pain. He states he was smoking and thinks he inhaled a piece of glass. He describes chest pain is not consistent with ACS. Also has pain in his right foot. He denies any trauma to the area. Patient is a diabetic however he may have stepped on a piece of glass. X-rays of both areas are negative. Patient has been seen in this emergency department multiple times in the past 6 months. I doubt serious cardiac etiology to his chest pain. He was found washing himself in the room and frequently requesting crackers and juice. At this time patient appears well in no acute distress. His vital signs were within his normal limits and he is safe for discharge.  I personally performed the services described in this documentation, which was scribed in my presence. The recorded information has been reviewed and is accurate.    Tomasita Crumble, MD 12/14/14 [phone]

## 2014-12-14 NOTE — Discharge Instructions (Signed)
Chest Pain (Nonspecific) [name], see primary care physician within 3 days for close follow-up. If symptoms worsen come back to the emergency department immediately. Thank you. It is often hard to give a diagnosis for the cause of chest pain. There is always a chance that your pain could be related to something serious, such as a heart attack or a blood clot in the lungs. You need to follow up with your doctor. HOME CARE  If antibiotic medicine was given, take it as directed by your doctor. Finish the medicine even if you start to feel better.  For the next few days, avoid activities that bring on chest pain. Continue physical activities as told by your doctor.  Do not use any tobacco products. This includes cigarettes, chewing tobacco, and e-cigarettes.  Avoid drinking alcohol.  Only take medicine as told by your doctor.  Follow your doctor's suggestions for more testing if your chest pain does not go away.  Keep all doctor visits you made. GET HELP IF:  Your chest pain does not go away, even after treatment.  You have a rash with blisters on your chest.  You have a fever. GET HELP RIGHT AWAY IF:   You have more pain or pain that spreads to your arm, neck, jaw, back, or belly (abdomen).  You have shortness of breath.  You cough more than usual or cough up blood.  You have very bad back or belly pain.  You feel sick to your stomach (nauseous) or throw up (vomit).  You have very bad weakness.  You pass out (faint).  You have chills. This is an emergency. Do not wait to see if the problems will go away. Call your local emergency services (911 in [place] Do not drive yourself to the hospital. MAKE SURE YOU:   Understand these instructions.  Will watch your condition.  Will get help right away if you are not doing well or get worse. Document Released: 12/04/2007 Document Revised: 06/22/2013 Document Reviewed: 12/04/2007 [facility] Patient Information 2015 [place],  [place]. This information is not intended to replace advice given to you by your health care provider. Make sure you discuss any questions you have with your health care provider.

## 2014-12-18 ENCOUNTER — Emergency Department (HOSPITAL_COMMUNITY)
Admission: EM | Admit: 2014-12-18 | Discharge: 2014-12-18 | Disposition: A | Discharge status: Home / Self Care | Payer: Medicare Other | Attending: Emergency Medicine | Admitting: Emergency Medicine

## 2014-12-18 DIAGNOSIS — Z8669 Personal history of other diseases of the nervous system and sense organs: Secondary | ICD-10-CM | POA: Insufficient documentation

## 2014-12-18 DIAGNOSIS — Z8619 Personal history of other infectious and parasitic diseases: Secondary | ICD-10-CM | POA: Insufficient documentation

## 2014-12-18 DIAGNOSIS — G8929 Other chronic pain: Secondary | ICD-10-CM | POA: Insufficient documentation

## 2014-12-18 DIAGNOSIS — I10 Essential (primary) hypertension: Secondary | ICD-10-CM | POA: Insufficient documentation

## 2014-12-18 DIAGNOSIS — Z720 Tobacco use: Secondary | ICD-10-CM | POA: Insufficient documentation

## 2014-12-18 DIAGNOSIS — E119 Type 2 diabetes mellitus without complications: Secondary | ICD-10-CM | POA: Insufficient documentation

## 2014-12-18 DIAGNOSIS — Z590 Homelessness: Secondary | ICD-10-CM | POA: Insufficient documentation

## 2014-12-18 DIAGNOSIS — F209 Schizophrenia, unspecified: Secondary | ICD-10-CM | POA: Insufficient documentation

## 2014-12-18 DIAGNOSIS — I509 Heart failure, unspecified: Secondary | ICD-10-CM | POA: Insufficient documentation

## 2014-12-18 DIAGNOSIS — M79672 Pain in left foot: Secondary | ICD-10-CM | POA: Insufficient documentation

## 2014-12-18 DIAGNOSIS — M79671 Pain in right foot: Secondary | ICD-10-CM | POA: Insufficient documentation

## 2014-12-18 DIAGNOSIS — Z7982 Long term (current) use of aspirin: Secondary | ICD-10-CM | POA: Insufficient documentation

## 2014-12-18 MED ORDER — ACETAMINOPHEN 325 MG PO TABS: 325.0000 mg | ORAL_TABLET | Freq: Four times a day (QID) | ORAL | Status: DC | PRN

## 2014-12-18 NOTE — ED Notes (Signed)
I spoke with him shortly after his arrival to room 15.  He is ambulatory and announced to me that he "shit myself".  He has just now emerged from the bathroom, where he cleaned himself with towels, soap, etc, with which we provided him.  As I write this, our P.A. Is evaluating him.

## 2014-12-18 NOTE — ED Notes (Signed)
Per PTAR, patient called 911 for bilateral foot pain. Patient states he wants to talk to a Child psychotherapist as he does not feel like he should be living on the streets because of his age, he doesn't want to freeze or starve to death.

## 2014-12-18 NOTE — ED Notes (Signed)
Bed: WLPT3 Expected date:  Expected time:  Means of arrival:  Comments: PTAR

## 2014-12-18 NOTE — Discharge Instructions (Signed)
Take the prescribed medication as directed. Follow-up with your primary care physician or the [facility]. Return to the ED for new or worsening symptoms.   Emergency Department Resource Guide 1) Find a Doctor and Pay Out of Pocket Although you won't have to find out who is covered by your insurance plan, it is a good idea to ask around and get recommendations. You will then need to call the office and see if the doctor you have chosen will accept you as a new patient and what types of options they offer for patients who are self-pay. Some doctors offer discounts or will set up payment plans for their patients who do not have insurance, but you will need to ask so you aren't surprised when you get to your appointment.  2) Contact Your Local Health Department Not all health departments have doctors that can see patients for sick visits, but many do, so it is worth a call to see if yours does. If you don't know where your local health department is, you can check in your phone book. The CDC also has a tool to help you locate your state's health department, and many state websites also have listings of all of their local health departments.  3) Find a Walk-in Clinic If your illness is not likely to be very severe or complicated, you may want to try a walk in clinic. These are popping up all over the country in pharmacies, drugstores, and shopping centers. They're usually staffed by nurse practitioners or physician assistants that have been trained to treat common illnesses and complaints. They're usually fairly quick and inexpensive. However, if you have serious medical issues or chronic medical problems, these are probably not your best option.  No Primary Care Doctor: - Call Health Connect at  [phone] - they can help you locate a primary care doctor that  accepts your insurance, provides certain services, etc. - Physician Referral Service- [phone]  Chronic Pain  Problems: Organization         Address  Phone   Notes  [facility]  [phone] Patients need to be referred by their primary care doctor.   Medication Assistance: Organization         Address  Phone   Notes  [facility] Medication [facility] [address] [place] [place], [place] [address] [phone] --Must be a resident of [facility] -- Must have NO insurance coverage whatsoever (no Medicaid/ Medicare, etc.) -- The pt. MUST have a primary care doctor that directs their care regularly and follows them in the community   [facility]ist  [phone]   [employer]  [phone]    Agencies that provide inexpensive medical care: Organization         Address  Phone   Notes  [facility]  [phone]   [facility]    [phone]   [facility] [address] [place], [place] [address] [phone]   [facility] [address] [place] [place] [phone]   Planned Parenthood    [phone]   [facility]    [phone]   [facility]  [address], [place] Phone:  [phone], Fax:  [phone] Hours of Operation:  9 am - 6 pm, M-F.  Also accepts Medicaid/Medicare and self-pay.  [facility]  [address],  [place] Phone: [phone], Fax: [phone]. Hours of Operation:  8:30 am - 5:30 pm, M-F.  Also accepts Medicaid and self-pay.  [facility] [place] [address] [place] Phone: [phone]   [place], [place], [place] [phone], Ext. [phone] Mondays & Thursdays: 7-9 AM.  First 15 patients are seen on a first come, first serve basis.    [place] Providers:  Organization         Address  Phone   Notes  [facility] [address],  [phone] Also  accepts self-pay patients.  [facility] [address] [place], [place]  [phone]   [place], [address], [place] [phone]   [facility] [address] [place] [phone]   Lucianne Lei [address], [place]   [phone] Only accepts [place] Access [place] patients after they have their name applied to their card.   Self-Pay (no insurance) in [facility]:  Organization         Address  Phone   Notes  Sickle Cell Patients, [facility] [place] [phone]   [facility]gent Care [place] [phone]   [facility] [place]  [place], [place], [place] [phone]   [facility]/Dr. Osei-Bonsu  [address], [place] or [address], [place] [phone] Phone number for both [place] and [place] locations is the same.  [facility]gent [facility] and [facility] [address] [place] [phone]   [facility] [address], [place] or [address] [phone]   [facility] [address], [place] [phone], phone; [phone], fax Sees patients 1st and 3rd Saturday of every month.  Must not qualify for public or private insurance (i.e. Medicaid, Medicare, [place] Health Choice, Veterans' Benefits)  Household income should be no more than 200% of the poverty level The clinic cannot treat you if you are pregnant or think you are pregnant  Sexually transmitted diseases are not treated at the clinic.    Dental Care: Organization         Address  Phone  Notes  [facility] [place] [phone] Accepts children up to age 62 who are enrolled in [place] or [place]; pregnant  women with a Medicaid card; and children who have applied for Medicaid or [place] Health Choice, but were declined, whose parents can pay a reduced fee at time of service.  [facility]  [address], [place] [phone] Accepts children up to age 60 who are enrolled in [place] or [place]; pregnant women with a Medicaid card; and children who have applied for Medicaid or [place] Health Choice, but were declined, whose parents can pay a reduced fee at time of service.  [facility]  [place] [phone] Patients are seen by appointment only. Walk-ins are not accepted. [place] will see patients 15 years of age and older. Monday - Tuesday (8am-5pm) Most Wednesdays (8:30-5pm) $30 per visit, cash only  [place]  [place]  [address], [facility] [phone] Patients are seen by appointment only. Walk-ins are not accepted. [place] will see patients 69 years of age and older. One Wednesday Evening (Monthly: Volunteer Based).  $30 per visit, cash only  [place]  [phone] for adults; Children under age 84, call Graduate Pediatric Dentistry at [phone]. Children aged 22-14, please call [phone] to request a pediatric application.  Dental services are provided in all areas of dental care including fillings, crowns and bridges, complete and partial dentures, implants, gum treatment, root canals, and extractions. Preventive care is also provided. Treatment is provided to both adults and children. Patients are selected via a lottery and there is often a waiting list.   [facility] [address] [place]  [phone] [email or web address]   [facility] [address] [place], [place] [phone], Ext. [phone] Second and Fourth Thursday of each month, opens at 6:30 AM; Clinic ends at 9 AM.  Patients are  seen on a first-come first-served basis, and a limited number are seen during each clinic.   [facility]  [address] Hillard Danker [place], [place] [phone]   Eligibility Requirements You must have lived in [place], [place], or [place] counties for at least the last three months.   You cannot be eligible for state or federal sponsored [employer], including [employer], [place], or [employer].   You generally cannot be eligible for healthcare insurance through your employer.    How to apply: Eligibility screenings are held every Tuesday and Wednesday afternoon from 1:00 pm until 4:00 pm. You do not need an appointment for the interview!  [facility] [address] [place], [place]   [place]  [place] Department  [place]  [phone]    Behavioral Health Resources in the Community: Intensive Outpatient Programs Organization         Address  Phone  Notes  [place]. [address], [place], [place] [phone]   [facility] Outpatient [address], [place], [place]   ADS: [facility] [address] [place], [place]   [place] [address]  [place], [place] or [phone]   Substance Abuse Resources Organization         Address  Phone  Notes  Alcohol and Drug Services  [phone]   [place]  [phone]   [facility] [place]   Chinita Pester  [phone]   Residential & Outpatient Substance Abuse Program  [phone]   Psychological Services Organization         Address  Phone  Notes  [facility] [place]  [place]  [phone]   [place] [address], [place] or [phone]    Mobile Crisis  Teams Organization         Address  Phone  Notes  [facility], Mobile Crisis Care Unit  [phone]   [facility]  [address] [place], [place]   [place] [address] [place] [phone]    Self-Help/Support Groups Organization         Address  Phone             Notes  [place]. of [place] - variety  of support groups  [phone] Call for more information  Narcotics Anonymous (NA), [facility] [address], [employer] [place]  2 meetings at this location   Residential Sports administrator         Address  Phone  Notes  [facility] [address],    [place]  [phone]   [facility]  [address], [place] [address], [place], [place] [phone]   [facility] Treatment Facility [address] [place], [place] [phone] Admissions: 8am-3pm M-F  [facility] [address]    [place], [place] [phone]   [facility] [address] [place], [place], [place] [phone]   [facility] [address],  [place], [place] [phone]   [facility] - Intensive Outpatient [address], [place], [place] [phone]   [facility] ([facility] [address] [place].,  [place], [place] [phone] or [phone]   [facility] Services (RTS) [address], [place], [place] [phone] Accepts Medicaid  [facility] [place] [address],  [place] [phone] Substance Abuse/Addiction Treatment   [facility] Organization         Address  Phone  Notes  [facility]  [phone]   Angie Fava, PhD [address] Ervin Knack [place], [place]   [phone] or [phone]   [facility] Behavioral   [address] [place], [place] [phone]   [facility] [address] [place], [place] [phone]  Insurance/Medicaid/sponsorship through [facility] and Families [address]                                    [place], [place] [phone] Therapy/tele-psych/case  [facility] [address], [place] [phone]    Dr. Lolly Mustache  [phone]   [facility]  [employer] [facility] Dept. 1) [address], [place] 2) [address] [place] 3)  [address], [place] [phone]  [phone]   [facility] Child Abuse Hotline [phone] or [phone] (After Hours)

## 2014-12-18 NOTE — ED Provider Notes (Signed)
CSN: [id number]     Arrival date & time 12/18/14  0805 History   First MD Initiated Contact with Patient 12/18/14 [phone]     Chief Complaint  Patient presents with  . Foot Pain     (Consider location/radiation/quality/duration/timing/severity/associated sxs/prior Treatment) Patient is a 54 y.o. male presenting with lower extremity pain. The history is provided by the patient and medical records.  Foot Pain Associated symptoms include arthralgias.    This is a 54 year old male with past medical history significant for hypertension, diabetes, schizophrenia, congestive heart failure, peripheral neuropathy, polysubstance abuse, hepatitis C, chronic pain, presenting to the ED for bilateral foot pain. Patient states he is homeless and walks a great deal throughout the day. He states he has developed calluses on both of his feet which are causing him pain. He denies any new numbness or weakness. Patient has been drinking prior to arrival, does not specify quantity. He denies drug use prior to arrival.  Past Medical History  Diagnosis Date  . Diabetes mellitus without complication   . Hypertension   . Schizophrenia   . CHF (congestive heart failure)   . Neuropathy   . Polysubstance abuse   . Cocaine abuse   . Homelessness   . Hepatitis C   . Chronic foot pain   . Homelessness    No past surgical history on file. Family History  Problem Relation Age of Onset  . Hypertension Other   . Diabetes Other    History  Substance Use Topics  . Smoking status: Current Every Day Smoker -- 1.00 packs/day for 20 years    Types: Cigarettes  . Smokeless tobacco: Current User  . Alcohol Use: Yes     Comment: Pt denies    Review of Systems  Musculoskeletal: Positive for arthralgias.  All other systems reviewed and are negative.     Allergies  Haldol  Home Medications   Prior to Admission medications   Medication Sig Start Date End Date Taking? Authorizing Provider  acetaminophen  (TYLENOL) 325 MG tablet Take 1 tablet (325 mg total) by mouth every 6 (six) hours as needed. 12/18/14   Garlon Hatchet, PA-C  ARIPiprazole (ABILIFY) 10 MG tablet Take 1 tablet (10 mg total) by mouth daily. For mood control Patient not taking: Reported on 12/01/2014 11/05/14   Blake Divine, MD  aspirin 81 MG EC tablet Take 1 tablet (81 mg total) by mouth daily. For heart health Patient not taking: Reported on 11/13/2014 08/12/14   Sanjuana Kava, NP  benazepril (LOTENSIN) 5 MG tablet Take 1 tablet (5 mg total) by mouth daily. For high blood pressure Patient not taking: Reported on 11/13/2014 08/12/14   Sanjuana Kava, NP  benztropine (COGENTIN) 2 MG tablet Take 1 tablet (2 mg total) by mouth 2 (two) times daily. For prevention of drug induced tremors Patient not taking: Reported on 12/01/2014 11/05/14   Blake Divine, MD  cloNIDine (CATAPRES) 0.1 MG tablet Take 1 tablet (0.1 mg total) by mouth 2 (two) times daily. For high blood pressure Patient not taking: Reported on 12/01/2014 08/12/14   Sanjuana Kava, NP  furosemide (LASIX) 20 MG tablet Take 1 tablet (20 mg total) by mouth daily. For swellings/high blood pressure Patient not taking: Reported on 12/01/2014 08/12/14   Sanjuana Kava, NP  guaiFENesin (MUCINEX) 600 MG 12 hr tablet Take 2 tablets (1,200 mg total) by mouth 2 (two) times daily. Patient not taking: Reported on 11/03/2014 10/06/14   Arthor Captain, PA-C  metFORMIN (  GLUCOPHAGE) 500 MG tablet Take 1 tablet (500 mg total) by mouth daily with breakfast. For high blood sugar control Patient not taking: Reported on 12/01/2014 08/12/14   Sanjuana Kava, NP  tamsulosin (FLOMAX) 0.4 MG CAPS capsule Take 1 capsule (0.4 mg total) by mouth daily after supper. For prostate health Patient not taking: Reported on 12/01/2014 08/12/14   Sanjuana Kava, NP  traZODone (DESYREL) 50 MG tablet Take 1 tablet (50 mg total) by mouth at bedtime as needed for sleep. Patient not taking: Reported on 12/01/2014 08/12/14   Sanjuana Kava, NP   BP  195/88 mmHg  Pulse 52  Temp(Src) 98.7 F (37.1 C) (Oral)  Resp 16  SpO2 98%   Physical Exam  Constitutional: He is oriented to person, place, and time. He appears well-developed and well-nourished. No distress.  HENT:  Head: Normocephalic and atraumatic.  Mouth/Throat: Oropharynx is clear and moist.  Eyes: Conjunctivae and EOM are normal. Pupils are equal, round, and reactive to light.  Neck: Normal range of motion. Neck supple.  Cardiovascular: Normal rate, regular rhythm and normal heart sounds.   Pulmonary/Chest: Effort normal and breath sounds normal. No respiratory distress. He has no wheezes.  Abdominal: Soft. Bowel sounds are normal. There is no tenderness. There is no guarding.  Musculoskeletal: Normal range of motion.  Bilateral feet without bony deformities or swelling; calluses noted to bilateral soles of feet; no signs of superimposed infection; ambulatory without difficulty  Neurological: He is alert and oriented to person, place, and time.  Skin: Skin is warm and dry. He is not diaphoretic.  Psychiatric: He has a normal mood and affect.  Nursing note and vitals reviewed.   ED Course  Procedures (including critical care time) Labs Review Labs Reviewed - No data to display  Imaging Review No results found.   EKG Interpretation None      MDM   Final diagnoses:  Foot pain, bilateral   54 year old male well-known to the ED with frequent visits here with bilateral foot pain. He has chronic foot pain. On exam there are no deformities of his feet. There are calluses noted to bilateral soles without signs of superimposed infection. Patient remains ambulatory without difficulty. His vital signs are stable. Patient is stable for discharge home. Prescription for Tylenol given at his request.  Discussed plan with patient, he/she acknowledged understanding and agreed with plan of care.  Return precautions given for new or worsening symptoms.  Garlon Hatchet,  PA-C 12/18/14 1019  Bethann Berkshire, MD 12/20/14 [phone]

## 2014-12-18 NOTE — ED Notes (Signed)
Pt has cleaned self, requested sandwich (provided) bus pass (provided) extra scrubs (provided) pt now ready to leave.

## 2014-12-21 ENCOUNTER — Emergency Department (HOSPITAL_COMMUNITY)
Admission: EM | Admit: 2014-12-21 | Discharge: 2014-12-21 | Disposition: A | Discharge status: Home / Self Care | Payer: Medicare Other | Attending: Emergency Medicine | Admitting: Emergency Medicine

## 2014-12-21 ENCOUNTER — Encounter (HOSPITAL_COMMUNITY): Payer: Self-pay | Admitting: *Deleted

## 2014-12-21 ENCOUNTER — Emergency Department (HOSPITAL_COMMUNITY): Payer: Medicare Other

## 2014-12-21 DIAGNOSIS — Z8659 Personal history of other mental and behavioral disorders: Secondary | ICD-10-CM | POA: Insufficient documentation

## 2014-12-21 DIAGNOSIS — R079 Chest pain, unspecified: Secondary | ICD-10-CM | POA: Insufficient documentation

## 2014-12-21 DIAGNOSIS — I509 Heart failure, unspecified: Secondary | ICD-10-CM | POA: Insufficient documentation

## 2014-12-21 DIAGNOSIS — Z590 Homelessness: Secondary | ICD-10-CM | POA: Insufficient documentation

## 2014-12-21 DIAGNOSIS — R52 Pain, unspecified: Secondary | ICD-10-CM

## 2014-12-21 DIAGNOSIS — G8929 Other chronic pain: Secondary | ICD-10-CM | POA: Insufficient documentation

## 2014-12-21 DIAGNOSIS — E119 Type 2 diabetes mellitus without complications: Secondary | ICD-10-CM | POA: Insufficient documentation

## 2014-12-21 DIAGNOSIS — Z720 Tobacco use: Secondary | ICD-10-CM | POA: Insufficient documentation

## 2014-12-21 DIAGNOSIS — Z8619 Personal history of other infectious and parasitic diseases: Secondary | ICD-10-CM | POA: Insufficient documentation

## 2014-12-21 DIAGNOSIS — I10 Essential (primary) hypertension: Secondary | ICD-10-CM | POA: Insufficient documentation

## 2014-12-21 LAB — CBC WITH DIFFERENTIAL/PLATELET
Basophils Absolute: 0 10*3/uL (ref 0.0–0.1)
Basophils Relative: 0 % (ref 0–1)
EOS ABS: 0.1 10*3/uL (ref 0.0–0.7)
EOS PCT: 1 % (ref 0–5)
HEMATOCRIT: 35.1 % — AB (ref 39.0–52.0)
HEMOGLOBIN: 11.3 g/dL — AB (ref 13.0–17.0)
Lymphocytes Relative: 22 % (ref 12–46)
Lymphs Abs: 1.4 10*3/uL (ref 0.7–4.0)
MCH: 29.2 pg (ref 26.0–34.0)
MCHC: 32.2 g/dL (ref 30.0–36.0)
MCV: 90.7 fL (ref 78.0–100.0)
MONO ABS: 0.5 10*3/uL (ref 0.1–1.0)
Monocytes Relative: 9 % (ref 3–12)
Neutro Abs: 4.1 10*3/uL (ref 1.7–7.7)
Neutrophils Relative %: 68 % (ref 43–77)
Platelets: 157 10*3/uL (ref 150–400)
RBC: 3.87 MIL/uL — AB (ref 4.22–5.81)
RDW: 13.8 % (ref 11.5–15.5)
WBC: 6.1 10*3/uL (ref 4.0–10.5)

## 2014-12-21 LAB — COMPREHENSIVE METABOLIC PANEL
ALBUMIN: 3.2 g/dL — AB (ref 3.5–5.0)
ALK PHOS: 89 U/L (ref 38–126)
ALT: 10 U/L — ABNORMAL LOW (ref 17–63)
ANION GAP: 6 (ref 5–15)
AST: 18 U/L (ref 15–41)
BILIRUBIN TOTAL: 0.5 mg/dL (ref 0.3–1.2)
BUN: 11 mg/dL (ref 6–20)
CALCIUM: 9 mg/dL (ref 8.9–10.3)
CO2: 27 mmol/L (ref 22–32)
CREATININE: 0.9 mg/dL (ref 0.61–1.24)
Chloride: 108 mmol/L (ref 101–111)
GLUCOSE: 168 mg/dL — AB (ref 65–99)
Potassium: 3.7 mmol/L (ref 3.5–5.1)
Sodium: 141 mmol/L (ref 135–145)
TOTAL PROTEIN: 5.7 g/dL — AB (ref 6.5–8.1)

## 2014-12-21 LAB — TROPONIN I: Troponin I: <0.03 ng/mL (ref <0.031)
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
## 2014-12-21 NOTE — ED Notes (Signed)
Pt was screaming at staff about "breakfast" Pt also making inappropriate comments to me. Given bus pass and sandwich, two chocolate milks and a sprite per patient request.

## 2014-12-21 NOTE — ED Notes (Signed)
Pt leaving for XRay. 

## 2014-12-21 NOTE — Discharge Instructions (Signed)
Follow up with a family md for recheck °

## 2014-12-22 ENCOUNTER — Encounter (HOSPITAL_COMMUNITY): Payer: Self-pay | Admitting: *Deleted

## 2014-12-22 ENCOUNTER — Emergency Department (HOSPITAL_COMMUNITY)
Admission: EM | Admit: 2014-12-22 | Discharge: 2014-12-22 | Disposition: A | Discharge status: Home / Self Care | Payer: Medicare Other | Attending: Emergency Medicine | Admitting: Emergency Medicine

## 2014-12-22 DIAGNOSIS — Z590 Homelessness: Secondary | ICD-10-CM | POA: Insufficient documentation

## 2014-12-22 DIAGNOSIS — F1420 Cocaine dependence, uncomplicated: Secondary | ICD-10-CM | POA: Diagnosis not present

## 2014-12-22 DIAGNOSIS — F1994 Other psychoactive substance use, unspecified with psychoactive substance-induced mood disorder: Secondary | ICD-10-CM

## 2014-12-22 DIAGNOSIS — F19929 Other psychoactive substance use, unspecified with intoxication, unspecified: Secondary | ICD-10-CM | POA: Diagnosis present

## 2014-12-22 DIAGNOSIS — Z8619 Personal history of other infectious and parasitic diseases: Secondary | ICD-10-CM | POA: Insufficient documentation

## 2014-12-22 DIAGNOSIS — F209 Schizophrenia, unspecified: Secondary | ICD-10-CM | POA: Insufficient documentation

## 2014-12-22 DIAGNOSIS — G8929 Other chronic pain: Secondary | ICD-10-CM | POA: Insufficient documentation

## 2014-12-22 DIAGNOSIS — R45851 Suicidal ideations: Secondary | ICD-10-CM | POA: Insufficient documentation

## 2014-12-22 DIAGNOSIS — I509 Heart failure, unspecified: Secondary | ICD-10-CM | POA: Insufficient documentation

## 2014-12-22 DIAGNOSIS — Z720 Tobacco use: Secondary | ICD-10-CM | POA: Insufficient documentation

## 2014-12-22 DIAGNOSIS — E119 Type 2 diabetes mellitus without complications: Secondary | ICD-10-CM | POA: Insufficient documentation

## 2014-12-22 DIAGNOSIS — I10 Essential (primary) hypertension: Secondary | ICD-10-CM | POA: Insufficient documentation

## 2014-12-22 LAB — COMPREHENSIVE METABOLIC PANEL
ALBUMIN: 3.8 g/dL (ref 3.5–5.0)
ALK PHOS: 89 U/L (ref 38–126)
ALT: 9 U/L — AB (ref 17–63)
AST: 16 U/L (ref 15–41)
Anion gap: 8 (ref 5–15)
BUN: 13 mg/dL (ref 6–20)
CALCIUM: 9.1 mg/dL (ref 8.9–10.3)
CO2: 25 mmol/L (ref 22–32)
Chloride: 107 mmol/L (ref 101–111)
Creatinine, Ser: 1 mg/dL (ref 0.61–1.24)
GFR calc Af Amer: >60 mL/min (ref >60)
GFR calc non Af Amer: >60 mL/min (ref >60)
Glucose, Bld: 220 mg/dL — ABNORMAL HIGH (ref 65–99)
Potassium: 3.6 mmol/L (ref 3.5–5.1)
SODIUM: 140 mmol/L (ref 135–145)
TOTAL PROTEIN: 6.7 g/dL (ref 6.5–8.1)
Total Bilirubin: 0.8 mg/dL (ref 0.3–1.2)

## 2014-12-22 LAB — CBC
HCT: 36.9 % — ABNORMAL LOW (ref 39.0–52.0)
HEMOGLOBIN: 11.7 g/dL — AB (ref 13.0–17.0)
MCH: 29.3 pg (ref 26.0–34.0)
MCHC: 31.7 g/dL (ref 30.0–36.0)
MCV: 92.3 fL (ref 78.0–100.0)
Platelets: 176 10*3/uL (ref 150–400)
RBC: 4 MIL/uL — AB (ref 4.22–5.81)
RDW: 13.8 % (ref 11.5–15.5)
WBC: 7.5 10*3/uL (ref 4.0–10.5)

## 2014-12-22 LAB — RAPID URINE DRUG SCREEN, HOSP PERFORMED
Amphetamines: NOT DETECTED
BARBITURATES: NOT DETECTED
Benzodiazepines: NOT DETECTED
Cocaine: POSITIVE — AB
Opiates: NOT DETECTED
Tetrahydrocannabinol: NOT DETECTED

## 2014-12-22 LAB — SALICYLATE LEVEL

## 2014-12-22 LAB — ETHANOL: Alcohol, Ethyl (B): <5 mg/dL (ref <5)

## 2014-12-22 LAB — ACETAMINOPHEN LEVEL

## 2014-12-22 LAB — CBG MONITORING, ED: Glucose-Capillary: 125 mg/dL — ABNORMAL HIGH (ref 65–99)

## 2014-12-22 MED ORDER — ARIPIPRAZOLE 5 MG PO TABS: 5.0000 mg | ORAL_TABLET | Freq: Every day | ORAL | Status: DC

## 2014-12-22 MED ORDER — TRAZODONE HCL 100 MG PO TABS: 100.0000 mg | ORAL_TABLET | Freq: Every day | ORAL | Status: DC

## 2014-12-22 MED ORDER — ZOLPIDEM TARTRATE 5 MG PO TABS: 5.0000 mg | ORAL_TABLET | Freq: Every evening | ORAL | Status: DC | PRN

## 2014-12-22 MED ORDER — ACETAMINOPHEN 325 MG PO TABS
650.0000 mg | ORAL_TABLET | Freq: Once | ORAL | Status: DC
  Filled 2014-12-22: qty 2

## 2014-12-22 MED ORDER — ARIPIPRAZOLE 5 MG PO TABS
5.0000 mg | ORAL_TABLET | Freq: Every day | ORAL | Status: DC
  Filled 2014-12-22: qty 1

## 2014-12-22 MED ORDER — ONDANSETRON HCL 4 MG PO TABS: 4.0000 mg | ORAL_TABLET | Freq: Three times a day (TID) | ORAL | Status: DC | PRN

## 2014-12-22 MED ORDER — ACETAMINOPHEN 325 MG PO TABS: 650.0000 mg | ORAL_TABLET | ORAL | Status: DC | PRN

## 2014-12-22 MED ORDER — IBUPROFEN 200 MG PO TABS: 600.0000 mg | ORAL_TABLET | Freq: Three times a day (TID) | ORAL | Status: DC | PRN

## 2014-12-22 MED ORDER — LORAZEPAM 1 MG PO TABS
1.0000 mg | ORAL_TABLET | Freq: Three times a day (TID) | ORAL | Status: DC | PRN
  Filled 2014-12-22: qty 1

## 2014-12-22 NOTE — Consult Note (Signed)
[facility] Face-to-Face Psychiatry Consult   Reason for Consult:  Substance induced mood disorder,  Schizophrenia by hx Referring Physician: EDP Patient Identification: [name] MRN:  [id number] Principal Diagnosis: Substance or medication-induced bipolar and related disorder with onset during intoxication Diagnosis:   Patient Active Problem List   Diagnosis Date Noted  . 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 Cocaine use disorder, severe, dependence [F14.20]     Priority: High  . 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 Chronic paranoid schizophrenia [F20.0] 09/07/2014  . Acute CHF (congestive heart failure) [I50.9] 07/29/2014  . Essential hypertension, benign [I10] 03/28/2013  . Diabetes mellitus [E11.9] 03/15/2013    Total Time spent with patient: 1 hour  Subjective:   [name] is a 54 y.o. male patient admitted with Substance Induced mood disorder, Schizophrenia by hx  HPI:  [name], 54 years old was evaluated doe suicide ideation with plan to run into traffic.  This is his 32 nd visit to the ER in the last 6 months.  Patient was seen at [facility] yesterday after EMS brought him for erratic behavior.  Patient was discharged  And he came to [facility] ER.  He reports daily use of Cocaine.  Patient was supposed see a Teacher, music at [facility] and has missed several appointments. This morning patient was seen calm, cooperative and requested to be discharged home.  He is homeless and stated that he is okay going back to the street.  Patient ate break fast and took his Abilify this morning.   Patient asked for prescription for Abilify and has promised to see his Psychiatrist tomorrow at [facility].  Patient vehemently denies SI/HI/AVH, he denies previous suicide attempt and states that he has a cousin that cares about his welfare.  He is discharged home.  HPI Elements:   Location:   Substance induced mood disorder, Schizophrenia by hx. Quality:  moderate-severe. Severity:  moderate-severe. Timing:  acute. Duration:  Chronic Substance abuse, Chronic Schiziohrenia. Context:  Brought in by self for Cocaine induced mood disorder..  Past Medical History:  Past Medical History  Diagnosis Date  . Diabetes mellitus without complication   . Hypertension   . Schizophrenia   . CHF (congestive heart failure)   . Neuropathy   . Polysubstance abuse   . Cocaine abuse   . Homelessness   . Hepatitis C   . Chronic foot pain   . Homelessness    History reviewed. No pertinent past surgical history. Family History:  Family History  Problem Relation Age of Onset  . Hypertension Other   . Diabetes Other    Social History:  History  Alcohol Use  . Yes    Comment: Pt denies     History  Drug Use  . 7.00 per week  . Special: "Crack" cocaine, Cocaine, Marijuana    Comment: Cocaine tonight, Marijuana "a long time"    History   Social History  . Marital Status: Divorced    Spouse Name: N/A  . Number of Children: N/A  . Years of Education: N/A   Social History Main Topics  . Smoking status: Current Every Day Smoker -- 1.00 packs/day for 20 years    Types: Cigarettes  . Smokeless tobacco: Current User  . Alcohol Use: Yes     Comment: Pt denies  . Drug Use: 7.00 per week    Special: "Crack" cocaine, Cocaine, Marijuana  Comment: Cocaine tonight, Marijuana "a long time"  . Sexual Activity: No   Other Topics Concern  . None   Social History Narrative   ** Merged History Encounter **       Additional Social History:    Pain Medications: SEE MAR Prescriptions: SEE MAR Over the Counter: SEE MAR History of alcohol / drug use?: Yes Longest period of sobriety (when/how long): unknown Negative Consequences of Use: Financial, Legal Name of Substance 1: Cocaine/Crack 1 - Age of First Use: 54 yrs old  1 - Amount (size/oz): 1 gram 1 - Frequency: daily  1 -  Duration: on-going  1 - Last Use / Amount: Today                   Allergies:   Allergies  Allergen Reactions  . Haldol [Haloperidol] Other (See Comments)    Muscle spasms, loss of voluntary movement. However, pt has taken Thorazine on multiple occasions with no adverse effects.     Labs:  Results for orders placed or performed during the hospital encounter of 12/22/14 (from the past 48 hour(s))  Acetaminophen level     Status: Abnormal   Collection Time: 12/22/14  3:25 AM  Result Value Ref Range   Acetaminophen (Tylenol), Serum <10 (L) 10 - 30 ug/mL    Comment:        THERAPEUTIC CONCENTRATIONS VARY SIGNIFICANTLY. A RANGE OF 10-30 ug/mL MAY BE AN EFFECTIVE CONCENTRATION FOR MANY PATIENTS. HOWEVER, SOME ARE BEST TREATED AT CONCENTRATIONS OUTSIDE THIS RANGE. ACETAMINOPHEN CONCENTRATIONS >150 ug/mL AT 4 HOURS AFTER INGESTION AND >50 ug/mL AT 12 HOURS AFTER INGESTION ARE OFTEN ASSOCIATED WITH TOXIC REACTIONS.   Ethanol (ETOH)     Status: None   Collection Time: 12/22/14  3:25 AM  Result Value Ref Range   Alcohol, Ethyl (B) <5 <5 mg/dL    Comment:        LOWEST DETECTABLE LIMIT FOR SERUM ALCOHOL IS 5 mg/dL FOR MEDICAL PURPOSES ONLY   Salicylate level     Status: None   Collection Time: 12/22/14  3:25 AM  Result Value Ref Range   Salicylate Lvl <9.6 2.8 - 30.0 mg/dL  CBC     Status: Abnormal   Collection Time: 12/22/14  3:26 AM  Result Value Ref Range   WBC 7.5 4.0 - 10.5 K/uL   RBC 4.00 (L) 4.22 - 5.81 MIL/uL   Hemoglobin 11.7 (L) 13.0 - 17.0 g/dL   HCT 36.9 (L) 39.0 - 52.0 %   MCV 92.3 78.0 - 100.0 fL   MCH 29.3 26.0 - 34.0 pg   MCHC 31.7 30.0 - 36.0 g/dL   RDW 13.8 11.5 - 15.5 %   Platelets 176 150 - 400 K/uL  Comprehensive metabolic panel     Status: Abnormal   Collection Time: 12/22/14  3:26 AM  Result Value Ref Range   Sodium 140 135 - 145 mmol/L   Potassium 3.6 3.5 - 5.1 mmol/L   Chloride 107 101 - 111 mmol/L   CO2 25 22 - 32 mmol/L   Glucose,  Bld 220 (H) 65 - 99 mg/dL   BUN 13 6 - 20 mg/dL   Creatinine, Ser 1.00 0.61 - 1.24 mg/dL   Calcium 9.1 8.9 - 10.3 mg/dL   Total Protein 6.7 6.5 - 8.1 g/dL   Albumin 3.8 3.5 - 5.0 g/dL   AST 16 15 - 41 U/L   ALT 9 (L) 17 - 63 U/L   Alkaline Phosphatase 89 38 -  126 U/L   Total Bilirubin 0.8 0.3 - 1.2 mg/dL   GFR calc non Af Amer >60 >60 mL/min   GFR calc Af Amer >60 >60 mL/min    Comment: (NOTE) The eGFR has been calculated using the CKD EPI equation. This calculation has not been validated in all clinical situations. eGFR's persistently <60 mL/min signify possible Chronic Kidney Disease.    Anion gap 8 5 - 15  Urine rapid drug screen (hosp performed)not at [facility]     Status: Abnormal   Collection Time: 12/22/14  3:26 AM  Result Value Ref Range   Opiates NONE DETECTED NONE DETECTED   Cocaine POSITIVE (A) NONE DETECTED   Benzodiazepines NONE DETECTED NONE DETECTED   Amphetamines NONE DETECTED NONE DETECTED   Tetrahydrocannabinol NONE DETECTED NONE DETECTED   Barbiturates NONE DETECTED NONE DETECTED    Comment:        DRUG SCREEN FOR MEDICAL PURPOSES ONLY.  IF CONFIRMATION IS NEEDED FOR ANY PURPOSE, NOTIFY LAB WITHIN 5 DAYS.        LOWEST DETECTABLE LIMITS FOR URINE DRUG SCREEN Drug Class       Cutoff (ng/mL) Amphetamine      1000 Barbiturate      200 Benzodiazepine   948 Tricyclics       546 Opiates          300 Cocaine          300 THC              50   CBG monitoring, ED     Status: Abnormal   Collection Time: 12/22/14  7:56 AM  Result Value Ref Range   Glucose-Capillary 125 (H) 65 - 99 mg/dL    Vitals: Blood pressure 171/93, pulse 85, temperature 98.7 F (37.1 C), temperature source Oral, resp. rate 20, SpO2 99 %.  Risk to Self: Suicidal Ideation: Yes-Currently Present Suicidal Intent: No Is patient at risk for suicide?: Yes Suicidal Plan?: Yes-Currently Present Specify Current Suicidal Plan: Run into traffic Access to Means: Yes Specify Access to Suicidal  Means: Road access What has been your use of drugs/alcohol within the last 12 months?: Daily crack cocaine use How many times?: 0 Other Self Harm Risks: SA Triggers for Past Attempts: None known Intentional Self Injurious Behavior: None Comment - Self Injurious Behavior: n Risk to Others: Homicidal Ideation: No Thoughts of Harm to Others: No Comment - Thoughts of Harm to Others: None Current Homicidal Intent: No Current Homicidal Plan: No Describe Current Homicidal Plan: None Access to Homicidal Means: No Describe Access to Homicidal Means: None Identified Victim: n/a History of harm to others?: No Assessment of Violence: None Noted Violent Behavior Description: None Does patient have access to weapons?: No Criminal Charges Pending?: No Describe Pending Criminal Charges: None reported Does patient have a court date: No Prior Inpatient Therapy: Prior Inpatient Therapy: Yes Prior Therapy Dates: Multiple Prior Therapy Facilty/Provider(s): [facility] and multiple others Reason for Treatment: Psychosis, SI/HI Prior Outpatient Therapy: Prior Outpatient Therapy: Yes Prior Therapy Dates: Multiple Prior Therapy Facilty/Provider(s): None currently; [name] in past Reason for Treatment: Med Mgmt Does patient have an ACCT team?: Unknown Does patient have Intensive In-House Services?  : No Does patient have [name] services? : Unknown Does patient have P4[facility] services?: No  Current Facility-Administered Medications  Medication Dose Route Frequency Provider Last Rate Last Dose  . acetaminophen (TYLENOL) tablet 650 mg  650 mg Oral Q4H PRN Kaitlyn Szekalski, PA-C      . ARIPiprazole (ABILIFY) tablet  5 mg  5 mg Oral Daily Kayelyn Lemon      . ibuprofen (ADVIL,MOTRIN) tablet 600 mg  600 mg Oral Q8H PRN Alvina Chou, PA-C      . LORazepam (ATIVAN) tablet 1 mg  1 mg Oral Q8H PRN Kaitlyn Szekalski, PA-C      . ondansetron (ZOFRAN) tablet 4 mg  4 mg Oral Q8H PRN Kaitlyn Szekalski, PA-C      .  traZODone (DESYREL) tablet 100 mg  100 mg Oral QHS Jadea Shiffer       Current Outpatient Prescriptions  Medication Sig Dispense Refill  . acetaminophen (TYLENOL) 325 MG tablet Take 1 tablet (325 mg total) by mouth every 6 (six) hours as needed. (Patient not taking: Reported on 12/22/2014) 30 tablet 0  . ARIPiprazole (ABILIFY) 10 MG tablet Take 1 tablet (10 mg total) by mouth daily. For mood control (Patient not taking: Reported on 12/01/2014) 14 tablet 0  . aspirin 81 MG EC tablet Take 1 tablet (81 mg total) by mouth daily. For heart health (Patient not taking: Reported on 11/13/2014) 30 tablet 3  . benazepril (LOTENSIN) 5 MG tablet Take 1 tablet (5 mg total) by mouth daily. For high blood pressure (Patient not taking: Reported on 11/13/2014) 30 tablet 0  . benztropine (COGENTIN) 2 MG tablet Take 1 tablet (2 mg total) by mouth 2 (two) times daily. For prevention of drug induced tremors (Patient not taking: Reported on 12/01/2014) 28 tablet 0  . cloNIDine (CATAPRES) 0.1 MG tablet Take 1 tablet (0.1 mg total) by mouth 2 (two) times daily. For high blood pressure (Patient not taking: Reported on 12/01/2014) 60 tablet 0  . furosemide (LASIX) 20 MG tablet Take 1 tablet (20 mg total) by mouth daily. For swellings/high blood pressure (Patient not taking: Reported on 12/01/2014) 30 tablet 3  . guaiFENesin (MUCINEX) 600 MG 12 hr tablet Take 2 tablets (1,200 mg total) by mouth 2 (two) times daily. (Patient not taking: Reported on 11/03/2014) 30 tablet 0  . metFORMIN (GLUCOPHAGE) 500 MG tablet Take 1 tablet (500 mg total) by mouth daily with breakfast. For high blood sugar control (Patient not taking: Reported on 12/01/2014) 30 tablet 0  . tamsulosin (FLOMAX) 0.4 MG CAPS capsule Take 1 capsule (0.4 mg total) by mouth daily after supper. For prostate health (Patient not taking: Reported on 12/01/2014) 30 capsule 0  . traZODone (DESYREL) 50 MG tablet Take 1 tablet (50 mg total) by mouth at bedtime as needed for sleep. (Patient  not taking: Reported on 12/01/2014) 30 tablet 0    Musculoskeletal: Strength & Muscle Tone: within normal limits Gait & Station: normal Patient leans: N/A  Psychiatric Specialty Exam: Physical Exam  Review of Systems  Constitutional: Negative.   HENT: Negative.   Eyes: Negative.   Respiratory: Negative.   Cardiovascular: Negative.   Gastrointestinal: Negative.   Genitourinary: Negative.   Musculoskeletal: Negative.   Skin: Negative.   Neurological: Negative.   Endo/Heme/Allergies: Negative.     Blood pressure 171/93, pulse 85, temperature 98.7 F (37.1 C), temperature source Oral, resp. rate 20, SpO2 99 %.There is no weight on file to calculate BMI.  General Appearance: Casual and Disheveled  Eye Contact::  Good  Speech:  Clear and Coherent and Normal Rate  Volume:  Normal  Mood:  Euphoric  Affect:  Congruent  Thought Process:  Coherent, Goal Directed and Intact  Orientation:  Full (Time, Place, and Person)  Thought Content:  WDL  Suicidal Thoughts:  No  Homicidal Thoughts:  No  Memory:  Immediate;   Good Recent;   Good Remote;   Good  Judgement:  Fair  Insight:  Fair  Psychomotor Activity:  Normal  Concentration:  NA  Recall:  [employer] of Knowledge:Fair  Language: Good  Akathisia:  NA  Handed:  Right  AIMS (if indicated):     Assets:  Desire for Improvement Transportation  ADL's:  Impaired  Cognition: WNL  Sleep:      Medical Decision Making: Established Problem, Stable/Improving (1)   Plan:  Discharge home. Disposition: Discharge home to follow up with [name].  Delfin Gant   PMHNP-BC 12/22/2014 10:21 AM Patient seen face-to-face for psychiatric evaluation, chart reviewed and case discussed with the physician extender and developed treatment plan. Reviewed the information documented and agree with the treatment plan. Corena Pilgrim, MD

## 2014-12-22 NOTE — ED Notes (Signed)
Pt states that he is suicidal and has a plan to jump out in front of a car; pt has been seen numerous times for SI; pt was seen at [facility] ER earlier today; pt eating a bag of chips upon arrival to triage

## 2014-12-22 NOTE — ED Notes (Signed)
Patient flat, irritable. Patient reports SI. Denies HI, AVH. Patient kept back to writer and covers over his head. Refused Tylenol order, states he is "not hurting".   Encouragement offered. Left beverage on bedside table.  Q 15 safety checks in place.

## 2014-12-22 NOTE — [facility] Suicide Risk Assessment (Cosign Needed)
Suicide Risk Assessment  Discharge Assessment   [facility] Discharge Suicide Risk Assessment   Demographic Factors:  Male, Low socioeconomic status, Living alone and Unemployed  Total Time spent with patient: 20 minutes  Musculoskeletal: Strength & Muscle Tone: within normal limits Gait & Station: normal Patient leans: N/A  Psychiatric Specialty Exam:     Blood pressure 178/87, pulse 83, temperature 97.8 F (36.6 C), temperature source Oral, resp. rate 18, SpO2 98 %.There is no weight on file to calculate BMI.  General Appearance: Casual and Disheveled  Eye Contact::  Good  Speech:  Clear and Coherent and Normal Rate  Volume:  Normal  Mood:  Euphoric  Affect:  Congruent  Thought Process:  Coherent, Goal Directed and Intact  Orientation:  Full (Time, Place, and Person)  Thought Content:  WDL  Suicidal Thoughts:  No  Homicidal Thoughts:  No  Memory:  Immediate;   Good Recent;   Good Remote;   Good  Judgement:  Fair  Insight:  Fair  Psychomotor Activity:  Normal  Concentration:  NA  Recall:  [employer] of Knowledge:Fair  Language: Good  Akathisia:  NA  Handed:  Right  AIMS (if indicated):     Assets:  Desire for Improvement Transportation  ADL's:  Impaired  Cognition: WNL        Has this patient used any form of tobacco in the last 30 days? (Cigarettes, Smokeless Tobacco, Cigars, and/or Pipes) Yes, A prescription for an FDA-approved tobacco cessation medication was offered at discharge and the patient refused  Mental Status Per Nursing Assessment::   On Admission:     Current Mental Status by Physician: NA  Loss Factors: NA  Historical Factors: NA  Risk Reduction Factors:   Religious beliefs about death and want to get help with Cocaine use.  Continued Clinical Symptoms:  Bipolar Disorder:   Depressive phase Depression:   Insomnia Alcohol/Substance Abuse/Dependencies  Cognitive Features That Contribute To Risk:  Polarized thinking    Suicide Risk:   Minimal: No identifiable suicidal ideation.  Patients presenting with no risk factors but with morbid ruminations; may be classified as minimal risk based on the severity of the depressive symptoms  Principal Problem: Substance or medication-induced bipolar and related disorder with onset during intoxication Discharge Diagnoses:  Patient Active Problem List   Diagnosis Date Noted  . 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 Cocaine use disorder, severe, dependence [F14.20]     Priority: High  . Suicidal ideation [R45.851]   . 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 Chronic paranoid schizophrenia [F20.0] 09/07/2014  . Acute CHF (congestive heart failure) [I50.9] 07/29/2014  . Essential hypertension, benign [I10] 03/28/2013  . Diabetes mellitus [E11.9] 03/15/2013      Plan Of Care/Follow-up recommendations:  Activity:  as tolerated Diet:  regular  Is patient on multiple antipsychotic therapies at discharge:  No   Has Patient had three or more failed trials of antipsychotic monotherapy by history:  No  Recommended Plan for Multiple Antipsychotic Therapies: NA    Akila Batta C    PMHNP-BC 12/22/2014, 11:03 AM

## 2014-12-22 NOTE — ED Provider Notes (Signed)
CSN: [id number]     Arrival date & time 12/22/14  0246 History   First MD Initiated Contact with Patient 12/22/14 0413     Chief Complaint  Patient presents with  . Suicidal     (Consider location/radiation/quality/duration/timing/severity/associated sxs/prior Treatment) HPI Comments: Patient is a 54 year old male with a past medical history of diabetes, hypertension, cocaine abuse, and schizophrenia who presents with suicidal ideations. Patient reports feeling this way "for a while" and has plans to jump out in front of a car. Patient denies HI. Patient admits to abusing crack cocaine and last used yesterday. He denies any alcohol or other drug use. Patient reports he does not take any of his medications.    Past Medical History  Diagnosis Date  . Diabetes mellitus without complication   . Hypertension   . Schizophrenia   . CHF (congestive heart failure)   . Neuropathy   . Polysubstance abuse   . Cocaine abuse   . Homelessness   . Hepatitis C   . Chronic foot pain   . Homelessness    History reviewed. No pertinent past surgical history. Family History  Problem Relation Age of Onset  . Hypertension Other   . Diabetes Other    History  Substance Use Topics  . Smoking status: Current Every Day Smoker -- 1.00 packs/day for 20 years    Types: Cigarettes  . Smokeless tobacco: Current User  . Alcohol Use: Yes     Comment: Pt denies    Review of Systems  Psychiatric/Behavioral:       Suicidal  All other systems reviewed and are negative.     Allergies  Haldol  Home Medications   Prior to Admission medications   Medication Sig Start Date End Date Taking? Authorizing Provider  acetaminophen (TYLENOL) 325 MG tablet Take 1 tablet (325 mg total) by mouth every 6 (six) hours as needed. Patient not taking: Reported on 12/22/2014 12/18/14   Garlon Hatchet, PA-C  ARIPiprazole (ABILIFY) 10 MG tablet Take 1 tablet (10 mg total) by mouth daily. For mood control Patient not  taking: Reported on 12/01/2014 11/05/14   Blake Divine, MD  aspirin 81 MG EC tablet Take 1 tablet (81 mg total) by mouth daily. For heart health Patient not taking: Reported on 11/13/2014 08/12/14   Sanjuana Kava, NP  benazepril (LOTENSIN) 5 MG tablet Take 1 tablet (5 mg total) by mouth daily. For high blood pressure Patient not taking: Reported on 11/13/2014 08/12/14   Sanjuana Kava, NP  benztropine (COGENTIN) 2 MG tablet Take 1 tablet (2 mg total) by mouth 2 (two) times daily. For prevention of drug induced tremors Patient not taking: Reported on 12/01/2014 11/05/14   Blake Divine, MD  cloNIDine (CATAPRES) 0.1 MG tablet Take 1 tablet (0.1 mg total) by mouth 2 (two) times daily. For high blood pressure Patient not taking: Reported on 12/01/2014 08/12/14   Sanjuana Kava, NP  furosemide (LASIX) 20 MG tablet Take 1 tablet (20 mg total) by mouth daily. For swellings/high blood pressure Patient not taking: Reported on 12/01/2014 08/12/14   Sanjuana Kava, NP  guaiFENesin (MUCINEX) 600 MG 12 hr tablet Take 2 tablets (1,200 mg total) by mouth 2 (two) times daily. Patient not taking: Reported on 11/03/2014 10/06/14   Arthor Captain, PA-C  metFORMIN (GLUCOPHAGE) 500 MG tablet Take 1 tablet (500 mg total) by mouth daily with breakfast. For high blood sugar control Patient not taking: Reported on 12/01/2014 08/12/14   Nicole Kindred  I Nwoko, NP  tamsulosin (FLOMAX) 0.4 MG CAPS capsule Take 1 capsule (0.4 mg total) by mouth daily after supper. For prostate health Patient not taking: Reported on 12/01/2014 08/12/14   Sanjuana Kava, NP  traZODone (DESYREL) 50 MG tablet Take 1 tablet (50 mg total) by mouth at bedtime as needed for sleep. Patient not taking: Reported on 12/01/2014 08/12/14   Sanjuana Kava, NP   BP 171/93 mmHg  Pulse 85  Temp(Src) 98.7 F (37.1 C) (Oral)  Resp 20  SpO2 99% Physical Exam  Constitutional: He is oriented to person, place, and time. He appears well-developed and well-nourished. No distress.  HENT:  Head:  Normocephalic and atraumatic.  Eyes: Conjunctivae and EOM are normal.  Neck: Normal range of motion.  Cardiovascular: Normal rate and regular rhythm.  Exam reveals no gallop and no friction rub.   No murmur heard. Pulmonary/Chest: Effort normal and breath sounds normal. He has no wheezes. He has no rales. He exhibits no tenderness.  Abdominal: Soft. He exhibits no distension. There is no tenderness. There is no rebound.  Musculoskeletal: Normal range of motion.  Neurological: He is alert and oriented to person, place, and time. Coordination normal.  Speech is goal-oriented. Moves limbs without ataxia.   Skin: Skin is warm and dry.  Psychiatric: He has a normal mood and affect. His behavior is normal.  Nursing note and vitals reviewed.   ED Course  Procedures (including critical care time) Labs Review Labs Reviewed  ACETAMINOPHEN LEVEL - Abnormal; Notable for the following:    Acetaminophen (Tylenol), Serum <10 (*)    All other components within normal limits  CBC - Abnormal; Notable for the following:    RBC 4.00 (*)    Hemoglobin 11.7 (*)    HCT 36.9 (*)    All other components within normal limits  COMPREHENSIVE METABOLIC PANEL - Abnormal; Notable for the following:    Glucose, Bld 220 (*)    ALT 9 (*)    All other components within normal limits  URINE RAPID DRUG SCREEN, HOSP PERFORMED - Abnormal; Notable for the following:    Cocaine POSITIVE (*)    All other components within normal limits  ETHANOL  SALICYLATE LEVEL    Imaging Review Dg Chest 1 View  12/21/2014   CLINICAL DATA:  Left chest pain  EXAM: CHEST  1 VIEW  COMPARISON:  12/15/2014  FINDINGS: Lungs are clear.  No pleural effusion or pneumothorax.  Mild cardiomegaly.  Prominence of the azygos vein.  IMPRESSION: No evidence of acute cardiopulmonary disease.   Electronically Signed   By: Charline Bills M.D.   On: 12/21/2014 17:10     EKG Interpretation None      MDM   Final diagnoses:  Suicidal ideation     4:45 AM Patient will have TTS evaluation.     Emilia Beck, PA-C 12/22/14 2032  Richardean Canal, MD 12/22/14 [id number]

## 2014-12-22 NOTE — BH Assessment (Addendum)
Tele Assessment Note   [name] is an 54 y.o. male very well known to the ED with a hx of schizophrenia and SA. Pt has presented to the ED 32 times in the past six months. Pt was seen just yesterday at [facility] after calling EMS from a [employer] parking lot and displaying erratic, psychotic behavior. Pt is homeless. He currently reports that he is experiencing SI with plan to run out in traffic. Pt is irritable and lies in ED stretcher with the covers over his head, unwilling to engage in most of the TTS assessment; therefore, much of the info for this assessment is obtained from pt's chart. Pt reports he had OP services set up with [facility] and failed to follow through and now has no OP providers. At time of assessment, pt presents with labile mood, poor eye-contact, and irritable affect. Pt responds minimally to counselor's questions and is disheveled. Thought process is disorganized. Pt has a hx of A/VH in the form of seeing dogs and monsters and hearing conversations in his head. Pt has a hx of making homicidal threats towards others but he currently reports only SI with plan and no intent. Pt suffers from paranoid and persecutory delusions, feeling as if people who are trying to help him are cheating him in some way or that others have done something to contribute to his homelessness. Pt is unable to contract for safety at this time. Pt reports a long hx of SA, especially crack cocaine. He typically uses it on a daily basis. He also has a hx of multiple psychiatric hospitalizations. Pt denies any hx of violence. He denies hx of trauma or abuse. He has a hx of non-compliance with medications.  Per [name], PA, AM psych eval recommended.  Axis I: 295.90 Schizophrenia           304.20 Cocaine Use Disorder, Moderate to Severe Axis II: Deferred Axis III:  Past Medical History  Diagnosis Date  . Diabetes mellitus without complication   . Hypertension   . Schizophrenia   .  CHF (congestive heart failure)   . Neuropathy   . Polysubstance abuse   . Cocaine abuse   . Homelessness   . Hepatitis C   . Chronic foot pain   . Homelessness    Axis IV: economic problems, housing problems, occupational problems, other psychosocial or environmental problems, problems related to legal system/crime, problems related to social environment and problems with access to health care services Axis V: 31-40 impairment in reality testing  Past Medical History:  Past Medical History  Diagnosis Date  . Diabetes mellitus without complication   . Hypertension   . Schizophrenia   . CHF (congestive heart failure)   . Neuropathy   . Polysubstance abuse   . Cocaine abuse   . Homelessness   . Hepatitis C   . Chronic foot pain   . Homelessness     History reviewed. No pertinent past surgical history.  Family History:  Family History  Problem Relation Age of Onset  . Hypertension Other   . Diabetes Other     Social History:  reports that he has been smoking Cigarettes.  He has a 20 pack-year smoking history. He uses smokeless tobacco. He reports that he drinks alcohol. He reports that he uses illicit drugs ("Crack" cocaine, Cocaine, and Marijuana) about 7 times per week.  Additional Social History:  Alcohol / Drug Use Pain Medications: SEE MAR Prescriptions: SEE MAR Over the Counter:  SEE MAR History of alcohol / drug use?: Yes Longest period of sobriety (when/how long): unknown Negative Consequences of Use: Financial, Legal Substance #1 Name of Substance 1: Cocaine/Crack 1 - Age of First Use: 54 yrs old  1 - Amount (size/oz): 1 gram 1 - Frequency: daily  1 - Duration: on-going  1 - Last Use / Amount: Today  CIWA: CIWA-Ar BP: 171/93 mmHg Pulse Rate: 85 COWS:    PATIENT STRENGTHS: (choose at least two) Ability for insight Average or above average intelligence Communication skills  Allergies:  Allergies  Allergen Reactions  . Haldol [Haloperidol] Other  (See Comments)    Muscle spasms, loss of voluntary movement. However, pt has taken Thorazine on multiple occasions with no adverse effects.     Home Medications:  (Not in a hospital admission)  OB/GYN Status:  No LMP for male patient.  General Assessment Data Location of Assessment: [facility] ED TTS Assessment: In system Is this a Tele or Face-to-Face Assessment?: Face-to-Face Is this an Initial Assessment or a Re-assessment for this encounter?: Initial Assessment Marital status: Single Is patient pregnant?: No Pregnancy Status: No Living Arrangements: Alone (Homeless) Can pt return to current living arrangement?: Yes Admission Status: Voluntary Is patient capable of signing voluntary admission?: Yes Referral Source: Self/Family/Friend Insurance type: Medicare     Crisis Care Plan Living Arrangements: Alone (Homeless) Name of Psychiatrist: None Name of Therapist: None  Education Status Is patient currently in school?: No Current Grade: na Highest grade of school patient has completed: 12 Name of school: na Contact person: na  Risk to self with the past 6 months Suicidal Ideation: Yes-Currently Present Has patient been a risk to self within the past 6 months prior to admission? : Yes Suicidal Intent: No Has patient had any suicidal intent within the past 6 months prior to admission? : Yes Is patient at risk for suicide?: Yes Suicidal Plan?: Yes-Currently Present Has patient had any suicidal plan within the past 6 months prior to admission? : Yes Specify Current Suicidal Plan: Run into traffic Access to Means: Yes Specify Access to Suicidal Means: Road access What has been your use of drugs/alcohol within the last 12 months?: Daily crack cocaine use Previous Attempts/Gestures: No How many times?: 0 Other Self Harm Risks: SA Triggers for Past Attempts: None known Intentional Self Injurious Behavior: None Comment - Self Injurious Behavior: n Family Suicide History:  No Recent stressful life event(s): Financial Problems, Legal Issues Persecutory voices/beliefs?: Yes Depression: Yes Depression Symptoms: Insomnia, Isolating, Fatigue, Loss of interest in usual pleasures, Feeling worthless/self pity, Feeling angry/irritable Substance abuse history and/or treatment for substance abuse?: Yes Suicide prevention information given to non-admitted patients: Not applicable  Risk to Others within the past 6 months Homicidal Ideation: No Does patient have any lifetime risk of violence toward others beyond the six months prior to admission? : No Thoughts of Harm to Others: No Comment - Thoughts of Harm to Others: None Current Homicidal Intent: No Current Homicidal Plan: No Describe Current Homicidal Plan: None Access to Homicidal Means: No Describe Access to Homicidal Means: None Identified Victim: n/a History of harm to others?: No Assessment of Violence: None Noted Violent Behavior Description: None Does patient have access to weapons?: No Criminal Charges Pending?: No Describe Pending Criminal Charges: None reported Does patient have a court date: No Is patient on probation?: Unknown  Psychosis Hallucinations: Auditory Delusions: Persecutory  Mental Status Report Appearance/Hygiene: Disheveled Eye Contact: Poor Motor Activity: Agitation Speech: Incoherent Level of Consciousness: Drowsy Mood: Irritable  Affect: Angry Anxiety Level: Minimal Thought Processes: Circumstantial Judgement: Impaired Orientation: Person, Place, Time, Situation Obsessive Compulsive Thoughts/Behaviors: None  Cognitive Functioning Concentration: Poor Memory: Recent Intact IQ: Average Insight: Poor Impulse Control: Poor Appetite: Fair Weight Loss: 0 Weight Gain: 0 Sleep: Decreased Total Hours of Sleep: 4 Vegetative Symptoms: Decreased grooming  ADLScreening [facility]) Patient's cognitive ability adequate to safely complete daily activities?:  Yes Patient able to express need for assistance with ADLs?: Yes Independently performs ADLs?: Yes (appropriate for developmental age)  Prior Inpatient Therapy Prior Inpatient Therapy: Yes Prior Therapy Dates: Multiple Prior Therapy Facilty/Provider(s): [facility] and multiple others Reason for Treatment: Psychosis, SI/HI  Prior Outpatient Therapy Prior Outpatient Therapy: Yes Prior Therapy Dates: Multiple Prior Therapy Facilty/Provider(s): None currently; [name] in past Reason for Treatment: Med Mgmt Does patient have an ACCT team?: Unknown Does patient have Intensive In-House Services?  : No Does patient have [name] services? : Unknown Does patient have P4CC services?: No  ADL Screening (condition at time of admission) Patient's cognitive ability adequate to safely complete daily activities?: Yes Is the patient deaf or have difficulty hearing?: No Does the patient have difficulty seeing, even when wearing glasses/contacts?: No Does the patient have difficulty concentrating, remembering, or making decisions?: No Patient able to express need for assistance with ADLs?: Yes Does the patient have difficulty dressing or bathing?: No Independently performs ADLs?: Yes (appropriate for developmental age) Does the patient have difficulty walking or climbing stairs?: No Weakness of Legs: None Weakness of Arms/Hands: None  Home Assistive Devices/Equipment Home Assistive Devices/Equipment: None    Abuse/Neglect Assessment (Assessment to be complete while patient is alone) Physical Abuse: Denies Verbal Abuse: Denies Sexual Abuse: Denies Exploitation of patient/patient's resources: Denies Self-Neglect: Denies Values / Beliefs Cultural Requests During Hospitalization: None Spiritual Requests During Hospitalization: None   Advance Directives (For Healthcare) Does patient have an advance directive?: No Would patient like information on creating an advanced directive?: No - patient declined  information    Additional Information 1:1 In Past 12 Months?: No CIRT Risk: No Elopement Risk: No Does patient have medical clearance?: Yes     Disposition: AM Psych eval recommended Disposition Initial Assessment Completed for this Encounter: Yes Disposition of Patient: Other dispositions Other disposition(s): Other (Comment)  Cyndie Mull, [facility] Triage Specialist  12/22/2014 6:19 AM

## 2014-12-22 NOTE — BH Assessment (Signed)
[facility] Assessment Progress Note  Per Thedore Mins, MD, this pt does not require psychiatric hospitalization.  He is to be discharged from [facility] with outpatient referral information.  Discharge instructions include information for [name].  Pt's nurse, Dawnaly, has been notified.  Doylene Canning, MA Triage Specialist [phone]

## 2014-12-22 NOTE — ED Notes (Signed)
Bed: UXN23 Expected date:  Expected time:  Means of arrival:  Comments: [employer]

## 2014-12-22 NOTE — Discharge Instructions (Signed)
For your ongoing mental health needs, you are advised to follow up with [name].  New and returning patients are seen at their walk-in clinic.  Walk-in hours are Monday - Friday from 8:00 am - 3:00 pm.  Walk-in patients are seen on a first come, first served basis.  Try to arrive as early as possible for he best chance of being seen the same day: ° °     [name] °     [address] °     [place], [place] [address] °     [phone] °

## 2014-12-22 NOTE — ED Notes (Signed)
Patient discharged to home.  Left the unit ambulatory with all belongings.  Denies thoughts of harm to self or others.

## 2014-12-23 ENCOUNTER — Emergency Department (HOSPITAL_COMMUNITY)
Admission: EM | Admit: 2014-12-23 | Discharge: 2014-12-23 | Disposition: A | Discharge status: Home / Self Care | Payer: Medicare Other

## 2014-12-23 NOTE — ED Provider Notes (Signed)
CSN: [id number]     Arrival date & time 12/21/14  1601 History   First MD Initiated Contact with Patient 12/21/14 1621     Chief Complaint  Patient presents with  . Chest Pain     (Consider location/radiation/quality/duration/timing/severity/associated sxs/prior Treatment) Patient is a 54 y.o. male presenting with chest pain. The history is provided by the patient (pt had chest pain earlier today.  pt without pain now).  Chest Pain Pain location:  L chest Pain quality: aching   Pain radiates to:  Does not radiate Pain radiates to the back: no   Pain severity:  Mild Onset quality:  Sudden Timing:  Intermittent Associated symptoms: no abdominal pain, no back pain, no cough, no fatigue and no headache     Past Medical History  Diagnosis Date  . Diabetes mellitus without complication   . Hypertension   . Schizophrenia   . CHF (congestive heart failure)   . Neuropathy   . Polysubstance abuse   . Cocaine abuse   . Homelessness   . Hepatitis C   . Chronic foot pain   . Homelessness    History reviewed. No pertinent past surgical history. Family History  Problem Relation Age of Onset  . Hypertension Other   . Diabetes Other    History  Substance Use Topics  . Smoking status: Current Every Day Smoker -- 1.00 packs/day for 20 years    Types: Cigarettes  . Smokeless tobacco: Current User  . Alcohol Use: Yes     Comment: Pt denies    Review of Systems  Constitutional: Negative for appetite change and fatigue.  HENT: Negative for congestion, ear discharge and sinus pressure.   Eyes: Negative for discharge.  Respiratory: Negative for cough.   Cardiovascular: Positive for chest pain.  Gastrointestinal: Negative for abdominal pain and diarrhea.  Genitourinary: Negative for frequency and hematuria.  Musculoskeletal: Negative for back pain.  Skin: Negative for rash.  Neurological: Negative for seizures and headaches.  Psychiatric/Behavioral: Negative for hallucinations.       Allergies  Haldol  Home Medications   Prior to Admission medications   Medication Sig Start Date End Date Taking? Authorizing Provider  ARIPiprazole (ABILIFY) 5 MG tablet Take 1 tablet (5 mg total) by mouth daily. 12/22/14   Earney Navy, NP  benazepril (LOTENSIN) 5 MG tablet Take 1 tablet (5 mg total) by mouth daily. For high blood pressure Patient not taking: Reported on 11/13/2014 08/12/14   Sanjuana Kava, NP  benztropine (COGENTIN) 2 MG tablet Take 1 tablet (2 mg total) by mouth 2 (two) times daily. For prevention of drug induced tremors Patient not taking: Reported on 12/01/2014 11/05/14   Blake Divine, MD  cloNIDine (CATAPRES) 0.1 MG tablet Take 1 tablet (0.1 mg total) by mouth 2 (two) times daily. For high blood pressure Patient not taking: Reported on 12/01/2014 08/12/14   Sanjuana Kava, NP  furosemide (LASIX) 20 MG tablet Take 1 tablet (20 mg total) by mouth daily. For swellings/high blood pressure Patient not taking: Reported on 12/01/2014 08/12/14   Sanjuana Kava, NP  guaiFENesin (MUCINEX) 600 MG 12 hr tablet Take 2 tablets (1,200 mg total) by mouth 2 (two) times daily. Patient not taking: Reported on 11/03/2014 10/06/14   Arthor Captain, PA-C  metFORMIN (GLUCOPHAGE) 500 MG tablet Take 1 tablet (500 mg total) by mouth daily with breakfast. For high blood sugar control Patient not taking: Reported on 12/01/2014 08/12/14   Sanjuana Kava, NP  tamsulosin [facility])  0.4 MG CAPS capsule Take 1 capsule (0.4 mg total) by mouth daily after supper. For prostate health Patient not taking: Reported on 12/01/2014 08/12/14   Sanjuana Kava, NP  traZODone (DESYREL) 50 MG tablet Take 1 tablet (50 mg total) by mouth at bedtime as needed for sleep. Patient not taking: Reported on 12/01/2014 08/12/14   Sanjuana Kava, NP   BP 156/82 mmHg  Pulse 95  Temp(Src) 98.4 F (36.9 C) (Oral)  Resp 16  Ht  (1.753 m)  Wt 176 lb 8 oz (80.06 kg)  BMI 26.05 kg/m2  SpO2 91% Physical Exam  Constitutional: He is  oriented to person, place, and time. He appears well-developed.  HENT:  Head: Normocephalic.  Eyes: Conjunctivae and EOM are normal. No scleral icterus.  Neck: Neck supple. No thyromegaly present.  Cardiovascular: Normal rate and regular rhythm.  Exam reveals no gallop and no friction rub.   No murmur heard. Pulmonary/Chest: No stridor. He has no wheezes. He has no rales. He exhibits no tenderness.  Abdominal: He exhibits no distension. There is no tenderness. There is no rebound.  Musculoskeletal: Normal range of motion. He exhibits no edema.  Lymphadenopathy:    He has no cervical adenopathy.  Neurological: He is oriented to person, place, and time. He exhibits normal muscle tone. Coordination normal.  Skin: No rash noted. No erythema.  Psychiatric: He has a normal mood and affect. His behavior is normal.    ED Course  Procedures (including critical care time) Labs Review Labs Reviewed  CBC WITH DIFFERENTIAL/PLATELET - Abnormal; Notable for the following:    RBC 3.87 (*)    Hemoglobin 11.3 (*)    HCT 35.1 (*)    All other components within normal limits  COMPREHENSIVE METABOLIC PANEL - Abnormal; Notable for the following:    Glucose, Bld 168 (*)    Total Protein 5.7 (*)    Albumin 3.2 (*)    ALT 10 (*)    All other components within normal limits  TROPONIN I    Imaging Review Dg Chest 1 View  12/21/2014   CLINICAL DATA:  Left chest pain  EXAM: CHEST  1 VIEW  COMPARISON:  12/15/2014  FINDINGS: Lungs are clear.  No pleural effusion or pneumothorax.  Mild cardiomegaly.  Prominence of the azygos vein.  IMPRESSION: No evidence of acute cardiopulmonary disease.   Electronically Signed   By: Charline Bills M.D.   On: 12/21/2014 17:10     EKG Interpretation   Date/Time:  Wednesday December 21 2014 16:21:50 EDT Ventricular Rate:  96 PR Interval:  132 QRS Duration: 82 QT Interval:  378 QTC Calculation: 478 R Axis:   23 Text Interpretation:  Sinus rhythm Anterior infarct, old  Nonspecific T  abnormalities, lateral leads ED PHYSICIAN INTERPRETATION AVAILABLE IN [facility] Confirmed by TEST, Record ([id number]) on 12/22/2014 7:00:55 AM      MDM   Final diagnoses:  Pain  Chest pain at rest     chest pain resolved with nl studies.  Pt to follow up with pcp    Bethann Berkshire, MD 12/23/14 1553

## 2014-12-23 NOTE — ED Notes (Signed)
Pt. arrived with EMS from street reports hypertension and back pain , pt. refused to speak with triage RN or see a MD at arrival states " I just want to have a socks for my feet" .

## 2014-12-29 ENCOUNTER — Emergency Department (HOSPITAL_COMMUNITY)
Admission: EM | Admit: 2014-12-29 | Discharge: 2014-12-29 | Disposition: A | Discharge status: Home / Self Care | Payer: Medicare Other | Attending: Emergency Medicine | Admitting: Emergency Medicine

## 2014-12-29 ENCOUNTER — Encounter (HOSPITAL_COMMUNITY): Payer: Self-pay | Admitting: Emergency Medicine

## 2014-12-29 DIAGNOSIS — G8929 Other chronic pain: Secondary | ICD-10-CM | POA: Insufficient documentation

## 2014-12-29 DIAGNOSIS — Z8619 Personal history of other infectious and parasitic diseases: Secondary | ICD-10-CM | POA: Insufficient documentation

## 2014-12-29 DIAGNOSIS — R109 Unspecified abdominal pain: Secondary | ICD-10-CM | POA: Insufficient documentation

## 2014-12-29 DIAGNOSIS — G629 Polyneuropathy, unspecified: Secondary | ICD-10-CM | POA: Insufficient documentation

## 2014-12-29 DIAGNOSIS — E119 Type 2 diabetes mellitus without complications: Secondary | ICD-10-CM | POA: Insufficient documentation

## 2014-12-29 DIAGNOSIS — F209 Schizophrenia, unspecified: Secondary | ICD-10-CM | POA: Insufficient documentation

## 2014-12-29 DIAGNOSIS — Z79899 Other long term (current) drug therapy: Secondary | ICD-10-CM | POA: Insufficient documentation

## 2014-12-29 DIAGNOSIS — R6889 Other general symptoms and signs: Secondary | ICD-10-CM

## 2014-12-29 DIAGNOSIS — Z720 Tobacco use: Secondary | ICD-10-CM | POA: Insufficient documentation

## 2014-12-29 DIAGNOSIS — Z590 Homelessness: Secondary | ICD-10-CM | POA: Insufficient documentation

## 2014-12-29 DIAGNOSIS — I10 Essential (primary) hypertension: Secondary | ICD-10-CM | POA: Insufficient documentation

## 2014-12-29 DIAGNOSIS — I509 Heart failure, unspecified: Secondary | ICD-10-CM | POA: Insufficient documentation

## 2014-12-29 DIAGNOSIS — R079 Chest pain, unspecified: Secondary | ICD-10-CM | POA: Insufficient documentation

## 2014-12-29 MED ORDER — ACETAMINOPHEN 325 MG PO TABS
650.0000 mg | ORAL_TABLET | Freq: Once | ORAL | Status: DC
  Filled 2014-12-29: qty 2

## 2014-12-29 NOTE — ED Notes (Signed)
Patient became belligerent with staff when he was advised he was up for discahrge. Patient began making threatening statements, "I'll break your bones". Off Duty police officer heard statements made and engaged with patient. Patient refusing to leave, states "I want a bus pass". Patient was advised bus passes are not distributed when buses are not leaving. Patient states "then I'll stay until they do". Patient was advised he is not welcome to stay. Patient was advised he must leave property or he will be charged with trespassing.

## 2014-12-29 NOTE — ED Notes (Signed)
Patient arrives, requesting clothes. Patient given scrubs to change into. Patient also requested "care supplies". Patient brushing teeth while attempting to assess. MD at bedside.

## 2014-12-29 NOTE — ED Notes (Addendum)
Per EMS, patient approached [employer] asking for money, was declined at which time patient stated he needed an ambulance. Patient aggressive and combative with EMS. Patient refusing to allow EMS to assess. Patient states he has CHF and abd pain. Patient ambulatory on arrival to ER. Patient repeatedly attempting to expose his genitals to EMS staff. Patient states to EMS "I want a sandwich, a soda, and a Child psychotherapistsocial worker. I have abd pain." When asked to describe his pain patient states its "CHF".

## 2014-12-29 NOTE — ED Notes (Signed)
Bed: WA15 Expected date:  Expected time:  Means of arrival:  Comments: EMS  

## 2014-12-29 NOTE — ED Notes (Signed)
Patient states he is here because of his heart problems, states his chest is hurting and he wants tylenol. Patient states he walked 20 miles and he has neuropathy and needs something for pain.

## 2014-12-29 NOTE — Discharge Instructions (Signed)
°Emergency Department Resource Guide °1) Find a Doctor and Pay Out of Pocket °Although you won't have to find out who is covered by your insurance plan, it is a good idea to ask around and get recommendations. You will then need to call the office and see if the doctor you have chosen will accept you as a new patient and what types of options they offer for patients who are self-pay. Some doctors offer discounts or will set up payment plans for their patients who do not have insurance, but you will need to ask so you aren't surprised when you get to your appointment. ° °2) Contact Your Local Health Department °Not all health departments have doctors that can see patients for sick visits, but many do, so it is worth a call to see if yours does. If you don't know where your local health department is, you can check in your phone book. The CDC also has a tool to help you locate your state's health department, and many state websites also have listings of all of their local health departments. ° °3) Find a Walk-in Clinic °If your illness is not likely to be very severe or complicated, you may want to try a walk in clinic. These are popping up all over the country in pharmacies, drugstores, and shopping centers. They're usually staffed by nurse practitioners or physician assistants that have been trained to treat common illnesses and complaints. They're usually fairly quick and inexpensive. However, if you have serious medical issues or chronic medical problems, these are probably not your best option. ° °No Primary Care Doctor: °- Call [facility] Connect at  832-8000 - they can help you locate a primary care doctor that  accepts your insurance, provides certain services, etc. °- Physician Referral Service- 1-800-533-3463 ° °Chronic Pain Problems: °Organization         Address  Phone   Notes  °[facility]  (336) 297-2271 Patients need to be referred by their primary care doctor.  ° °Medication  Assistance: °Organization         Address  Phone   Notes  °[place] [facility] [address] °[place], [place] [address] (336) 641-8030 --Must be a resident of [place] °-- Must have NO insurance coverage whatsoever (no Medicaid/ Medicare, etc.) °-- The pt. MUST have a primary care doctor that directs their care regularly and follows them in the community °  °[facility]  (866) 331-1348   °[employer]  (888) 892-1162   ° °Agencies that provide inexpensive medical care: °Organization         Address  Phone   Notes  °[facility]  (336) 832-8035   °[facility]    (336) 832-7272   °[facility] [address] °[place], [place] [address] (336) 832-4777   °[facility] [address], °[place] (336) 271-4999   °[facility]    (336) 373-0678   °[facility]    (336) 272-1050   °[facility] ° [address], [place] Phone:  (336) 832-4444, Fax:  (336) 832-4440 Hours of Operation:  9 am - 6 pm, M-F.  Also accepts Medicaid/Medicare and self-pay.  °[facility] ° [address], [place] Phone: (336) 832-3150, Fax: (336) [phone]. Hours of Operation:  8:30 am - 5:30 pm, M-F.  Also accepts Medicaid and self-pay.  °[facility] [place] [address], [place] Phone: (336) 878-6027   °[facility] [address], [place], [place] (336)723-1848, Ext. 123 Mondays & Thursdays: 7-9 AM.  First 15 patients are seen on a first come, first serve basis. °  ° °Medicaid-accepting [place] Providers: ° °Organization         Address  Phone   Notes  °[facility] [address], [place] (336) 641-2100 Also accepts self-pay patients.  °[facility] [address], [place] ° (336) 856-9996   °[facility] [address], [place]  (336) 288-8857   °[facility] [address] [place] [address], [place] (336) 299-7000   °Veita Bland [address], [place]  ° (336) 373-1557 Only accepts [place] Access Medicaid patients after they have their name applied to their card.  ° °Self-Pay (no insurance) in [place]: ° °Organization         Address  Phone   Notes  °Sickle Cell Patients, [facility] [address], [place] (336) 832-1970   °[facility] [address], [place] (336) 832-4400   °[facility] ° [address], [place] (336) 992-4800   °[facility]/Dr. Osei-Bonsu ° [address] [place] [address], [place] or [address], [place] (336) 841-8500 Phone number for both [place] and [place] locations is the same.  °[facility] [address], [place] (336) 299-0000   °[facility] [address] [place] [address], [place] or [address] (336) 852-7530 °(336) 878-2260   °[facility] [address], [place] (336) 350-1642, phone; (336) 294-5005, fax Sees patients 1st and 3rd Saturday of every month.  Must not qualify for public or private insurance (i.e. Medicaid, Medicare, [place] Health Choice, Veterans' Benefits) • Household income should be no more than 200% of the poverty level •The clinic cannot treat you if you are pregnant or think you are pregnant • Sexually transmitted diseases are not treated at the clinic.  ° ° °Dental Care: °Organization         Address  Phone  Notes  °[place] [facility] [address],  (336) 641-6152 Accepts children up to age 21 who are enrolled in Medicaid or [place] Health Choice; pregnant women with a Medicaid card; and children who have applied for Medicaid or [place] Health Choice, but were declined, whose parents can pay a reduced fee at time of service.  °[place]  [facility] [place]  [address], [place] (336) 641-7733 Accepts children up to age 21 who are enrolled in Medicaid or [place] Health Choice; pregnant women with a Medicaid card; and children who have applied for Medicaid or [place] Health Choice, but were declined, whose parents can pay a reduced fee at time of service.  °[facility] ° [address],  (336) 641-4533 Patients are seen by appointment only. Walk-ins are not accepted. [facility] will see patients 18 years of age and older. °Monday - Tuesday (8am-5pm) °Most Wednesdays (8:30-5pm) °$30 per visit, cash only  °[facility] ° [address], [place] (336) 641-4533 Patients are seen by appointment only. Walk-ins are not accepted. [facility] will see patients 18 years of age and older. °One   Wednesday Evening (Monthly: Volunteer Based).  $30 per visit, cash only  °[facility]  (919) 537-3737 for adults; Children under age 4, call Graduate Pediatric Dentistry at (919) [phone]. Children aged 4-14, please call (919) [phone] to request a pediatric application. ° Dental services are provided in all areas of dental care including fillings, crowns and bridges, complete and partial dentures, implants, gum treatment, root canals, and extractions. Preventive care is also provided. Treatment is provided to both adults and children. °Patients are selected via a lottery and there is often a waiting list. °  °[facility] [address], °[place] ° (336) 763-8833 [email or web address] °  °[facility] [address], [place], [place] (336)723-1848, Ext. 123 Second and Fourth Thursday of each month, opens at 6:30 AM; Clinic ends at 9 AM.  Patients are seen on a first-come first-served basis, and a limited number are seen during each clinic.  ° °[facility] ° [address], [place], [place] (336) 723-7904    Eligibility Requirements °You must have lived in [place], [place], or [place] counties for at least the last three months. °  You cannot be eligible for state or federal sponsored healthcare insurance, including Veterans Administration, Medicaid, or Medicare. °  You generally cannot be eligible for healthcare insurance through your employer.  °  How to apply: °Eligibility screenings are held every Tuesday and Wednesday afternoon from 1:00 pm until 4:00 pm. You do not need an appointment for the interview!  °[facility] [address], [place], [place] 336-631-2330   °[facility] [facility]  336-342-8273   °[facility]  336-703-3100   °[facility]  336-570-6415   ° °Behavioral Health Resources in the Community: °Intensive Outpatient Programs °Organization         Address  Phone  Notes  °[facility] [address], [place], [place] 336-878-6098   °[facility] [address], [place], [place] 336-832-9800   °[facility] [address], [place], [place] ° 336-882-2125   °[facility] [address],  °[place], [place] 1-800-853-5163 or 336-641-4981   °Substance Abuse Resources °Organization         Address  Phone  Notes  °[facility]  336-882-2125   °[facility]  336-784-9470   °[facility]  336-285-9073   °[facility]  336-845-3988   °Residential & Outpatient Substance Abuse Program  1-800-659-3381   °Psychological Services °Organization         Address  Phone  Notes  °[facility]  336- 832-9600   °[facility]  336- 378-7881   °[facility] [address], [place] 1-800-853-5163 or 336-641-4981   ° °Mobile Crisis Teams °Organization         Address  Phone  Notes  °[facility], Mobile Crisis Care Unit  1-877-626-1772   °Assertive °[facility] ° [address]  [place], [place] 336-834-9664   °Sharon DeEsch [address] °[place] 336-554-5454   ° °Self-Help/Support Groups °Organization         Address  Phone             Notes  °[facility]  - variety of support groups  336- 373-1402 Call for more information  °Narcotics Anonymous (NA), [facility] [address], °[place]  2 meetings at this location  ° °  Residential Treatment Programs °Organization         Address  Phone  Notes  °[facility] [address],    °[place]  1-866-801-8205   °[facility] ° [address], [place], [place] 704-293-8524   °[facility] [address], [place] 336-845-3988 Admissions: 8am-3pm M-F  °[facility] [address]    °[place], [place] 336-841-1104   °[facility] [address], [place], [place] 336-379-7146   °[facility] [address]  °[place], [place] 336-285-9073   °[facility] - Intensive Outpatient [address], [place], [place] 336-852-3033   °[facility] [address]  °[place], [place] 1-877-615-2722 or 336-784-9470   °[facility] [address] [place], [place] 336-227-7417 Accepts Medicaid  °[facility] [address]  °[place] 1-800-659-3381 Substance Abuse/Addiction Treatment  ° °[place] Behavioral Health Resources °Organization         Address  Phone  Notes  °[facility]  (888) 581-9988   °Julie Brannon, PhD [address] [place], [place]   (336) 349-5553 or (336) 951-0000   °[facility]   [address] °[place], [place] (336) 349-4454   °[facility] [address], [place], [place] (336) 342-8316 Insurance/Medicaid/sponsorship through [facility]  °[facility] [address]                                    [place], [place] (336) 342-8316 Therapy/tele-psych/case    °[facility] [address]  ° [place],  (336) 349-2233    °Dr. Arfeen  (336) 349-4544   °[facility] [place]  [employer] [place] [facility] 1) [address], [place] °2) [address], [place] °3)  [address], [place] (336) 349-3220 °(336) 342-7768 ° °(336) 342-8140   °[place] [facility] (336) 342-1394 or (336) 342-3537 (After Hours)    ° ° °

## 2014-12-29 NOTE — ED Notes (Signed)
Patient given crackers and drink. 
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
## 2015-01-13 ENCOUNTER — Encounter (HOSPITAL_COMMUNITY): Payer: Self-pay | Admitting: *Deleted

## 2015-01-13 ENCOUNTER — Emergency Department (HOSPITAL_COMMUNITY)
Admission: EM | Admit: 2015-01-13 | Discharge: 2015-01-13 | Discharge status: Against Medical Advice | Payer: Medicare Other | Attending: Emergency Medicine | Admitting: Emergency Medicine

## 2015-01-13 DIAGNOSIS — I10 Essential (primary) hypertension: Secondary | ICD-10-CM | POA: Insufficient documentation

## 2015-01-13 DIAGNOSIS — Z8619 Personal history of other infectious and parasitic diseases: Secondary | ICD-10-CM | POA: Insufficient documentation

## 2015-01-13 DIAGNOSIS — G8929 Other chronic pain: Secondary | ICD-10-CM | POA: Insufficient documentation

## 2015-01-13 DIAGNOSIS — Z720 Tobacco use: Secondary | ICD-10-CM | POA: Insufficient documentation

## 2015-01-13 DIAGNOSIS — I509 Heart failure, unspecified: Secondary | ICD-10-CM | POA: Insufficient documentation

## 2015-01-13 DIAGNOSIS — Z950 Presence of cardiac pacemaker: Secondary | ICD-10-CM | POA: Insufficient documentation

## 2015-01-13 DIAGNOSIS — E119 Type 2 diabetes mellitus without complications: Secondary | ICD-10-CM | POA: Insufficient documentation

## 2015-01-13 DIAGNOSIS — F1410 Cocaine abuse, uncomplicated: Secondary | ICD-10-CM | POA: Insufficient documentation

## 2015-01-13 DIAGNOSIS — R45851 Suicidal ideations: Secondary | ICD-10-CM

## 2015-01-13 LAB — BASIC METABOLIC PANEL
ANION GAP: 9 (ref 5–15)
BUN: 13 mg/dL (ref 6–20)
CALCIUM: 9.8 mg/dL (ref 8.9–10.3)
CHLORIDE: 106 mmol/L (ref 101–111)
CO2: 25 mmol/L (ref 22–32)
CREATININE: 0.99 mg/dL (ref 0.61–1.24)
GFR calc non Af Amer: >60 mL/min (ref >60)
Glucose, Bld: 99 mg/dL (ref 65–99)
POTASSIUM: 3.9 mmol/L (ref 3.5–5.1)
SODIUM: 140 mmol/L (ref 135–145)

## 2015-01-13 LAB — CBC WITH DIFFERENTIAL/PLATELET
BASOS PCT: 0 % (ref 0–1)
Basophils Absolute: 0 10*3/uL (ref 0.0–0.1)
EOS ABS: 0 10*3/uL (ref 0.0–0.7)
EOS PCT: 0 % (ref 0–5)
HCT: 41.9 % (ref 39.0–52.0)
Hemoglobin: 13.4 g/dL (ref 13.0–17.0)
Lymphocytes Relative: 10 % — ABNORMAL LOW (ref 12–46)
Lymphs Abs: 1 10*3/uL (ref 0.7–4.0)
MCH: 29.3 pg (ref 26.0–34.0)
MCHC: 32 g/dL (ref 30.0–36.0)
MCV: 91.5 fL (ref 78.0–100.0)
MONOS PCT: 9 % (ref 3–12)
Monocytes Absolute: 0.9 10*3/uL (ref 0.1–1.0)
Neutro Abs: 7.8 10*3/uL — ABNORMAL HIGH (ref 1.7–7.7)
Neutrophils Relative %: 81 % — ABNORMAL HIGH (ref 43–77)
PLATELETS: 159 10*3/uL (ref 150–400)
RBC: 4.58 MIL/uL (ref 4.22–5.81)
RDW: 13.1 % (ref 11.5–15.5)
WBC: 9.6 10*3/uL (ref 4.0–10.5)

## 2015-01-13 LAB — ETHANOL: Alcohol, Ethyl (B): <5 mg/dL (ref <5)

## 2015-01-13 LAB — ACETAMINOPHEN LEVEL

## 2015-01-13 LAB — SALICYLATE LEVEL: Salicylate Lvl: <4 mg/dL (ref 2.8–30.0)

## 2015-01-13 NOTE — ED Notes (Signed)
Pt refusing sitter; pt refusing TTS; pt requesting to leave AMA; pt verbally upset; pt also requesting bus pass

## 2015-01-13 NOTE — ED Notes (Signed)
LAB RESULTS REQUESTED FROM LAB- to be tubed to B pod

## 2015-01-13 NOTE — BH Assessment (Signed)
[facility] Assessment Progress Note  Called and scheduled pt's tele assessment with this clinician.  Called EDP Dockerty and gathered clinical information on the pt.  Casimer LaniusKristen Ayaat Jansma, MS, [facility] Therapeutic Triage Specialist [facility]
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
## 2015-01-13 NOTE — BH Assessment (Signed)
[facility] Assessment Progress Note Pt left [facility] AMA and tele assessment cancelled.  Casimer LaniusKristen Natanael Saladin, MS, [facility] Therapeutic Triage Specialist [facility]

## 2015-01-13 NOTE — ED Notes (Signed)
MD at bedside. 

## 2015-01-13 NOTE — ED Notes (Signed)
BH staff called this RN to inform that there are no labs ordered; therefore TTS cannot be completed; [facility] and reordered when labs result
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
## 2015-01-16 ENCOUNTER — Encounter (HOSPITAL_COMMUNITY): Payer: Self-pay | Admitting: Emergency Medicine

## 2015-01-16 ENCOUNTER — Emergency Department (HOSPITAL_COMMUNITY)
Admission: EM | Admit: 2015-01-16 | Discharge: 2015-01-17 | Disposition: A | Discharge status: Other Acute Inpatient Hospital | Payer: Medicare Other | Attending: Emergency Medicine | Admitting: Emergency Medicine

## 2015-01-16 DIAGNOSIS — I10 Essential (primary) hypertension: Secondary | ICD-10-CM | POA: Insufficient documentation

## 2015-01-16 DIAGNOSIS — F419 Anxiety disorder, unspecified: Secondary | ICD-10-CM | POA: Insufficient documentation

## 2015-01-16 DIAGNOSIS — D849 Immunodeficiency, unspecified: Secondary | ICD-10-CM | POA: Insufficient documentation

## 2015-01-16 DIAGNOSIS — Z79899 Other long term (current) drug therapy: Secondary | ICD-10-CM | POA: Insufficient documentation

## 2015-01-16 DIAGNOSIS — Z590 Homelessness: Secondary | ICD-10-CM | POA: Insufficient documentation

## 2015-01-16 DIAGNOSIS — R45851 Suicidal ideations: Secondary | ICD-10-CM

## 2015-01-16 DIAGNOSIS — G8929 Other chronic pain: Secondary | ICD-10-CM | POA: Insufficient documentation

## 2015-01-16 DIAGNOSIS — Z720 Tobacco use: Secondary | ICD-10-CM | POA: Insufficient documentation

## 2015-01-16 DIAGNOSIS — Z8669 Personal history of other diseases of the nervous system and sense organs: Secondary | ICD-10-CM | POA: Insufficient documentation

## 2015-01-16 DIAGNOSIS — E119 Type 2 diabetes mellitus without complications: Secondary | ICD-10-CM | POA: Insufficient documentation

## 2015-01-16 DIAGNOSIS — F1410 Cocaine abuse, uncomplicated: Secondary | ICD-10-CM | POA: Insufficient documentation

## 2015-01-16 DIAGNOSIS — Z8619 Personal history of other infectious and parasitic diseases: Secondary | ICD-10-CM | POA: Insufficient documentation

## 2015-01-16 DIAGNOSIS — F200 Paranoid schizophrenia: Secondary | ICD-10-CM | POA: Insufficient documentation

## 2015-01-16 DIAGNOSIS — I509 Heart failure, unspecified: Secondary | ICD-10-CM | POA: Insufficient documentation

## 2015-01-16 DIAGNOSIS — F1420 Cocaine dependence, uncomplicated: Secondary | ICD-10-CM | POA: Diagnosis present

## 2015-01-16 DIAGNOSIS — R440 Auditory hallucinations: Secondary | ICD-10-CM

## 2015-01-16 LAB — COMPREHENSIVE METABOLIC PANEL
ALT: 10 U/L — AB (ref 17–63)
ANION GAP: 6 (ref 5–15)
AST: 20 U/L (ref 15–41)
Albumin: 3.7 g/dL (ref 3.5–5.0)
Alkaline Phosphatase: 86 U/L (ref 38–126)
BUN: 12 mg/dL (ref 6–20)
CHLORIDE: 109 mmol/L (ref 101–111)
CO2: 27 mmol/L (ref 22–32)
Calcium: 9.1 mg/dL (ref 8.9–10.3)
Creatinine, Ser: 0.97 mg/dL (ref 0.61–1.24)
GFR calc Af Amer: >60 mL/min (ref >60)
GLUCOSE: 98 mg/dL (ref 65–99)
Potassium: 3.8 mmol/L (ref 3.5–5.1)
Sodium: 142 mmol/L (ref 135–145)
Total Bilirubin: 0.8 mg/dL (ref 0.3–1.2)
Total Protein: 6.6 g/dL (ref 6.5–8.1)

## 2015-01-16 LAB — RAPID URINE DRUG SCREEN, HOSP PERFORMED
Amphetamines: NOT DETECTED
BARBITURATES: NOT DETECTED
BENZODIAZEPINES: NOT DETECTED
Cocaine: POSITIVE — AB
OPIATES: NOT DETECTED
TETRAHYDROCANNABINOL: NOT DETECTED

## 2015-01-16 LAB — SALICYLATE LEVEL: Salicylate Lvl: <4 mg/dL (ref 2.8–30.0)

## 2015-01-16 LAB — ACETAMINOPHEN LEVEL: Acetaminophen (Tylenol), Serum: <10 ug/mL — ABNORMAL LOW (ref 10–30)

## 2015-01-16 LAB — CBC WITH DIFFERENTIAL/PLATELET
Basophils Absolute: 0 10*3/uL (ref 0.0–0.1)
Basophils Relative: 0 % (ref 0–1)
EOS ABS: 0.1 10*3/uL (ref 0.0–0.7)
EOS PCT: 2 % (ref 0–5)
HEMATOCRIT: 39.4 % (ref 39.0–52.0)
HEMOGLOBIN: 12.3 g/dL — AB (ref 13.0–17.0)
LYMPHS ABS: 1.3 10*3/uL (ref 0.7–4.0)
LYMPHS PCT: 27 % (ref 12–46)
MCH: 28.7 pg (ref 26.0–34.0)
MCHC: 31.2 g/dL (ref 30.0–36.0)
MCV: 92.1 fL (ref 78.0–100.0)
Monocytes Absolute: 0.4 10*3/uL (ref 0.1–1.0)
Monocytes Relative: 8 % (ref 3–12)
NEUTROS PCT: 63 % (ref 43–77)
Neutro Abs: 2.9 10*3/uL (ref 1.7–7.7)
PLATELETS: 161 10*3/uL (ref 150–400)
RBC: 4.28 MIL/uL (ref 4.22–5.81)
RDW: 12.9 % (ref 11.5–15.5)
WBC: 4.7 10*3/uL (ref 4.0–10.5)

## 2015-01-16 LAB — ETHANOL: Alcohol, Ethyl (B): <5 mg/dL (ref <5)

## 2015-01-16 LAB — CBG MONITORING, ED
GLUCOSE-CAPILLARY: 104 mg/dL — AB (ref 65–99)
Glucose-Capillary: 108 mg/dL — ABNORMAL HIGH (ref 65–99)

## 2015-01-16 MED ORDER — NICOTINE 21 MG/24HR TD PT24
21.0000 mg | MEDICATED_PATCH | Freq: Every day | TRANSDERMAL | Status: DC
  Administered 2015-01-16 – 2015-01-17 (×2): 21 mg via TRANSDERMAL
  Filled 2015-01-16 (×2): qty 1

## 2015-01-16 MED ORDER — BENAZEPRIL HCL 5 MG PO TABS
5.0000 mg | ORAL_TABLET | Freq: Every day | ORAL | Status: DC
  Administered 2015-01-16 – 2015-01-17 (×2): 5 mg via ORAL
  Filled 2015-01-16 (×2): qty 1

## 2015-01-16 MED ORDER — ONDANSETRON HCL 4 MG PO TABS: 4.0000 mg | ORAL_TABLET | Freq: Three times a day (TID) | ORAL | Status: DC | PRN

## 2015-01-16 MED ORDER — ZOLPIDEM TARTRATE 5 MG PO TABS: 5.0000 mg | ORAL_TABLET | Freq: Every evening | ORAL | Status: DC | PRN

## 2015-01-16 MED ORDER — FUROSEMIDE 40 MG PO TABS
20.0000 mg | ORAL_TABLET | Freq: Every day | ORAL | Status: DC
  Administered 2015-01-16 – 2015-01-17 (×2): 20 mg via ORAL
  Filled 2015-01-16 (×2): qty 1

## 2015-01-16 MED ORDER — TAMSULOSIN HCL 0.4 MG PO CAPS
0.4000 mg | ORAL_CAPSULE | Freq: Every day | ORAL | Status: DC
  Filled 2015-01-16: qty 1

## 2015-01-16 MED ORDER — ZIPRASIDONE MESYLATE 20 MG IM SOLR
20.0000 mg | Freq: Once | INTRAMUSCULAR | Status: AC
  Administered 2015-01-16: 20 mg via INTRAMUSCULAR
  Filled 2015-01-16: qty 20

## 2015-01-16 MED ORDER — STERILE WATER FOR INJECTION IJ SOLN
INTRAMUSCULAR | Status: AC
  Administered 2015-01-16: 1.2 mL
  Filled 2015-01-16: qty 10

## 2015-01-16 MED ORDER — METFORMIN HCL 500 MG PO TABS
500.0000 mg | ORAL_TABLET | Freq: Every day | ORAL | Status: DC
  Administered 2015-01-17: 500 mg via ORAL
  Filled 2015-01-16 (×2): qty 1

## 2015-01-16 MED ORDER — LORAZEPAM 2 MG/ML IJ SOLN
2.0000 mg | Freq: Once | INTRAMUSCULAR | Status: AC
  Administered 2015-01-16: 2 mg via INTRAMUSCULAR
  Filled 2015-01-16: qty 1

## 2015-01-16 MED ORDER — LORAZEPAM 1 MG PO TABS
1.0000 mg | ORAL_TABLET | Freq: Three times a day (TID) | ORAL | Status: DC | PRN
  Administered 2015-01-16 – 2015-01-17 (×2): 1 mg via ORAL
  Filled 2015-01-16 (×2): qty 1

## 2015-01-16 MED ORDER — IBUPROFEN 200 MG PO TABS: 600.0000 mg | ORAL_TABLET | Freq: Three times a day (TID) | ORAL | Status: DC | PRN

## 2015-01-16 MED ORDER — TRAZODONE HCL 50 MG PO TABS: 50.0000 mg | ORAL_TABLET | Freq: Every evening | ORAL | Status: DC | PRN

## 2015-01-16 MED ORDER — ACETAMINOPHEN 325 MG PO TABS
650.0000 mg | ORAL_TABLET | ORAL | Status: DC | PRN
  Administered 2015-01-16 – 2015-01-17 (×2): 650 mg via ORAL
  Filled 2015-01-16 (×2): qty 2

## 2015-01-16 MED ORDER — ALUM & MAG HYDROXIDE-SIMETH 200-200-20 MG/5ML PO SUSP: 30.0000 mL | ORAL | Status: DC | PRN

## 2015-01-16 MED ORDER — CLONIDINE HCL 0.1 MG PO TABS
0.1000 mg | ORAL_TABLET | Freq: Two times a day (BID) | ORAL | Status: DC
  Administered 2015-01-16 – 2015-01-17 (×3): 0.1 mg via ORAL
  Filled 2015-01-16 (×3): qty 1

## 2015-01-16 NOTE — Progress Notes (Signed)
01/16/15  Patient belongings are in locker #30.

## 2015-01-16 NOTE — ED Provider Notes (Signed)
CSN: [id number]     Arrival date & time 01/16/15  1115 History   First MD Initiated Contact with Patient 01/16/15 1129     Chief Complaint  Patient presents with  . Suicidal     (Consider location/radiation/quality/duration/timing/severity/associated sxs/prior Treatment) HPI Comments: [name] is a 54 y.o. male with a PMHx of DM2, HTN, schizophrenia, CHF, neuropathy, polysubstance abuse/cocaine use, homelessness, Hep C, and chronic foot pain, who presents to the ED with complaints of suicidal ideations with a plan to cut his head off. Additionally reports auditory hallucinations and thoughts that he is being persecuted by others. Denies any visual hallucinations or homicidal ideations. He has not had any psychiatric medications in 6 months. He is requesting inpatient help with his suicidal ideations and hallucinations. No medical complaints at this time. Smokes 2 packs per day, drinks alcohol daily, last drank 2 cups of liquor 2 nights ago but none in the last 24 hours. He smokes crack cocaine daily, last use was last night. He is not sure of all of his home medications but he states he does take medicines that are prescribed to him aside from his psychiatric medications.  Patient is a 54 y.o. male presenting with mental health disorder. The history is provided by the patient. No language interpreter was used.  Mental Health Problem Presenting symptoms: hallucinations and suicidal thoughts   Presenting symptoms: no homicidal ideas   Onset quality:  Unable to specify Timing:  Constant Progression:  Unchanged Chronicity:  Recurrent Context: noncompliance   Treatment compliance:  Untreated Time since last psychoactive medication taken:  6 months Relieved by:  None tried Worsened by:  Nothing tried Ineffective treatments:  None tried Associated symptoms: no abdominal pain and no chest pain   Risk factors: hx of mental illness     Past Medical History  Diagnosis Date  .  Diabetes mellitus without complication   . Hypertension   . Schizophrenia   . CHF (congestive heart failure)   . Neuropathy   . Polysubstance abuse   . Cocaine abuse   . Homelessness   . Hepatitis C   . Chronic foot pain   . Homelessness    History reviewed. No pertinent past surgical history. Family History  Problem Relation Age of Onset  . Hypertension Other   . Diabetes Other    History  Substance Use Topics  . Smoking status: Current Every Day Smoker -- 1.00 packs/day for 20 years    Types: Cigarettes  . Smokeless tobacco: Current User  . Alcohol Use: Yes     Comment: Pt denies    Review of Systems  Constitutional: Negative for fever and chills.  Respiratory: Negative for shortness of breath.   Cardiovascular: Negative for chest pain.  Gastrointestinal: Negative for nausea, vomiting, abdominal pain, diarrhea and constipation.  Genitourinary: Negative for dysuria and hematuria.  Musculoskeletal: Negative for myalgias and arthralgias.  Skin: Negative for color change.  Allergic/Immunologic: Positive for immunocompromised state (diabetic).  Neurological: Negative for weakness and numbness.  Psychiatric/Behavioral: Positive for suicidal ideas and hallucinations. Negative for homicidal ideas and confusion.   10 Systems reviewed and are negative for acute change except as noted in the HPI.    Allergies  Haldol  Home Medications   Prior to Admission medications   Medication Sig Start Date End Date Taking? Authorizing Provider  ARIPiprazole (ABILIFY) 5 MG tablet Take 1 tablet (5 mg total) by mouth daily. Patient not taking: Reported on 01/13/2015 12/22/14   Earney Navy,  NP  benazepril (LOTENSIN) 5 MG tablet Take 1 tablet (5 mg total) by mouth daily. For high blood pressure Patient not taking: Reported on 11/13/2014 08/12/14   Sanjuana Kava, NP  benztropine (COGENTIN) 2 MG tablet Take 1 tablet (2 mg total) by mouth 2 (two) times daily. For prevention of drug induced  tremors Patient not taking: Reported on 12/01/2014 11/05/14   Blake Divine, MD  cloNIDine (CATAPRES) 0.1 MG tablet Take 1 tablet (0.1 mg total) by mouth 2 (two) times daily. For high blood pressure Patient not taking: Reported on 12/01/2014 08/12/14   Sanjuana Kava, NP  furosemide (LASIX) 20 MG tablet Take 1 tablet (20 mg total) by mouth daily. For swellings/high blood pressure Patient not taking: Reported on 12/01/2014 08/12/14   Sanjuana Kava, NP  guaiFENesin (MUCINEX) 600 MG 12 hr tablet Take 2 tablets (1,200 mg total) by mouth 2 (two) times daily. Patient not taking: Reported on 11/03/2014 10/06/14   Arthor Captain, PA-C  metFORMIN (GLUCOPHAGE) 500 MG tablet Take 1 tablet (500 mg total) by mouth daily with breakfast. For high blood sugar control Patient not taking: Reported on 12/01/2014 08/12/14   Sanjuana Kava, NP  tamsulosin (FLOMAX) 0.4 MG CAPS capsule Take 1 capsule (0.4 mg total) by mouth daily after supper. For prostate health Patient not taking: Reported on 12/01/2014 08/12/14   Sanjuana Kava, NP  traZODone (DESYREL) 50 MG tablet Take 1 tablet (50 mg total) by mouth at bedtime as needed for sleep. Patient not taking: Reported on 12/01/2014 08/12/14   Sanjuana Kava, NP   BP 149/77 mmHg  Pulse 76  Temp(Src) 98.4 F (36.9 C) (Oral)  Resp 20  SpO2 98% Physical Exam  Constitutional: He is oriented to person, place, and time. Vital signs are normal. He appears well-developed and well-nourished.  Non-toxic appearance. No distress.  Afebrile, nontoxic, NAD  HENT:  Head: Normocephalic and atraumatic.  Mouth/Throat: Oropharynx is clear and moist and mucous membranes are normal.  Eyes: Conjunctivae and EOM are normal. Right eye exhibits no discharge. Left eye exhibits no discharge.  Neck: Normal range of motion. Neck supple.  Cardiovascular: Normal rate, regular rhythm, normal heart sounds and intact distal pulses.  Exam reveals no gallop and no friction rub.   No murmur heard. Pulmonary/Chest: Effort  normal and breath sounds normal. No respiratory distress. He has no decreased breath sounds. He has no wheezes. He has no rhonchi. He has no rales.  Abdominal: Soft. Normal appearance and bowel sounds are normal. He exhibits no distension. There is no tenderness. There is no rigidity, no rebound, no guarding, no CVA tenderness, no tenderness at McBurney's point and negative Murphy's sign.  Musculoskeletal: Normal range of motion.  Neurological: He is alert and oriented to person, place, and time. He has normal strength. No sensory deficit.  Skin: Skin is warm, dry and intact. No rash noted.  Psychiatric: His mood appears anxious. His speech is rapid and/or pressured. He is actively hallucinating. Thought content is paranoid. He expresses suicidal ideation. He expresses no homicidal ideation. He expresses suicidal plans. He expresses no homicidal plans.  Anxious appearing. Rapid and pressured speech. Flight of ideas. Endorses auditory hallucinations and persecutory paranoid thoughts. Denies visual hallucinations and HI. Endorses SI with plan to cut his head off.  Nursing note and vitals reviewed.   ED Course  Procedures (including critical care time) Labs Review Labs Reviewed  CBC WITH DIFFERENTIAL/PLATELET - Abnormal; Notable for the following:    Hemoglobin 12.3 (*)  All other components within normal limits  COMPREHENSIVE METABOLIC PANEL - Abnormal; Notable for the following:    ALT 10 (*)    All other components within normal limits  URINE RAPID DRUG SCREEN, HOSP PERFORMED - Abnormal; Notable for the following:    Cocaine POSITIVE (*)    All other components within normal limits  ACETAMINOPHEN LEVEL - Abnormal; Notable for the following:    Acetaminophen (Tylenol), Serum <10 (*)    All other components within normal limits  ETHANOL  SALICYLATE LEVEL    Imaging Review No results found.   EKG Interpretation None      MDM   Final diagnoses:  Suicidal ideation  Chronic  paranoid schizophrenia  Auditory hallucinations    54 y.o. male here with SI without specific plan but thinks he'll slice his head off if he was allowed to leave. On exam, flight of ideas, somewhat anxious appearing. Endorses auditory hallucinations, feels like he's being persecuted. Homeless. No medical complaints. +EtOH use but none in last 24hrs. +Cocaine use yesterday. Smokes 2ppd. Noncompliant on psych meds. Will reorder other home meds, and place psych hold orders with TTS consult, will get labs for med clearance. Here voluntarily but if he tried to leave would need IVC. Will reassess after labs return.  1:03 PM Pt was becoming more combative, given geodon/ativan to calm pt down. IVC paperwork submitted. Clearance labs unremarkable aside from UDS showing cocaine+. Medically cleared, TTS consulted, [facility].   BP 166/84 mmHg  Pulse 80  Temp(Src) 98.4 F (36.9 C) (Oral)  Resp 18  SpO2 96%  Meds ordered this encounter  Medications  . alum & mag hydroxide-simeth (MAALOX/MYLANTA) 200-200-20 MG/5ML suspension 30 mL    Sig:   . ondansetron (ZOFRAN) tablet 4 mg    Sig:   . nicotine (NICODERM CQ - dosed in mg/24 hours) patch 21 mg    Sig:   . DISCONTD: zolpidem (AMBIEN) tablet 5 mg    Sig:   . ibuprofen (ADVIL,MOTRIN) tablet 600 mg    Sig:   . acetaminophen (TYLENOL) tablet 650 mg    Sig:   . LORazepam (ATIVAN) tablet 1 mg    Sig:   . benazepril (LOTENSIN) tablet 5 mg    Sig:   . cloNIDine (CATAPRES) tablet 0.1 mg    Sig:   . furosemide (LASIX) tablet 20 mg    Sig:   . metFORMIN (GLUCOPHAGE) tablet 500 mg    Sig:   . tamsulosin (FLOMAX) capsule 0.4 mg    Sig:   . traZODone (DESYREL) tablet 50 mg    Sig:   . ziprasidone (GEODON) injection 20 mg    Sig:   . LORazepam (ATIVAN) injection 2 mg    Sig:   . sterile water (preservative free) injection    Sig:     Barrett, Blanche   : cabinet override     Levi StraussMercedes Camprubi-Soms,  PA-C 01/16/15 1305  Toy CookeyMegan Docherty, MD 01/16/15 1556

## 2015-01-16 NOTE — ED Notes (Signed)
Patient became angitated and then angry because i could not get (SI) patient pencills/pens and paper.

## 2015-01-16 NOTE — ED Notes (Signed)
Pt increase agitation because another nurse is sitting at NS. Pt stating, "I don't want her in here". Pt encourage to calm down. Security at bedside.

## 2015-01-16 NOTE — BH Assessment (Signed)
Per Julieanne CottonJosephine, NP - patient will be reassessed in the am for a final disposition.

## 2015-01-16 NOTE — ED Notes (Addendum)
Pt arrived via EMS with report of having SI, audible/visual hallucinations, and requesting multiple items on arrival. Pt wanting OJ and crackers, paper, crayons, and hygiene kit. Pt explained that nurse needs to check him in first and he needs to see the doctor prior to attending to request. Pt turned back and pulled covers over his head.

## 2015-01-16 NOTE — ED Notes (Signed)
Pt resting quietly with eyes closed. Resp even and unlabored. Noted with snoring resp. ABC' intact. SR on monitor.

## 2015-01-16 NOTE — ED Notes (Signed)
Patients blood pressure is slightly elevated.  Reported findings to [place].

## 2015-01-16 NOTE — ED Notes (Signed)
Called report to [place], Charity fundraiserN. Pt moved to RM 30 with 1 bag of personal belongings.

## 2015-01-16 NOTE — Progress Notes (Addendum)
ED CM spoke with pt about pcp. He confirms he still does not have a pcp.  Pt with 30 ED visits and 2 Admissions in the last 6 months PMH CHF, DM, homelessness  [facility]  This pt seen by this CM in March & May 2016 and given resources but pt is non compliant with finding a pcp

## 2015-01-16 NOTE — BH Assessment (Addendum)
Assessment Note  [name] is an 54 y.o. male that reports SI with a plan to kill himself.  Patient repots that he has a plan to cut his wrist.  Patient reports that he is not able to contract for safety.  Patient reports that he has not been taking his medication.  Patient reports that he does not know how long it has been since he has taken his psychiatric medication.  Patient reports that the demons are telling him to kill himself and to smoke crack cocaine.       Patient reports that he has spend the last 7 days in jail for a charge of trespassing and when he go out he used $60.00 worth of crack cocaine.  Patient deines any withdrawal symptoms.  Patient reports that he uses $50.00 worth of crack cocaine daily for the, "better part of his life".  Patient reports that he is not sure when he first began to use cocaine or how long he has been addicted to crack cocaine.    Patient denies HI.  Patient denies physical, sexual or emotional abuse.      Axis I: Schizoaffective Disorder and Cocaine Abuse, Severe  Axis II: Deferred Axis III:  Past Medical History  Diagnosis Date  . Diabetes mellitus without complication   . Hypertension   . Schizophrenia   . CHF (congestive heart failure)   . Neuropathy   . Polysubstance abuse   . Cocaine abuse   . Homelessness   . Hepatitis C   . Chronic foot pain   . Homelessness    Axis IV: economic problems, housing problems, occupational problems, other psychosocial or environmental problems, problems related to legal system/crime, problems related to social environment, problems with access to health care services and problems with primary support group Axis V: 41-50 serious symptoms  Past Medical History:  Past Medical History  Diagnosis Date  . Diabetes mellitus without complication   . Hypertension   . Schizophrenia   . CHF (congestive heart failure)   . Neuropathy   . Polysubstance abuse   . Cocaine abuse   . Homelessness   .  Hepatitis C   . Chronic foot pain   . Homelessness     History reviewed. No pertinent past surgical history.  Family History:  Family History  Problem Relation Age of Onset  . Hypertension Other   . Diabetes Other     Social History:  reports that he has been smoking Cigarettes.  He has a 20 pack-year smoking history. He uses smokeless tobacco. He reports that he drinks alcohol. He reports that he uses illicit drugs ("Crack" cocaine, Cocaine, and Marijuana) about 7 times per week.  Additional Social History:  Alcohol / Drug Use History of alcohol / drug use?: Yes Longest period of sobriety (when/how long): None  Negative Consequences of Use: Financial, Legal, Personal relationships, Work / School Withdrawal Symptoms:  (None Reported) Substance #1 Name of Substance 1: Crack Cocaine  1 - Age of First Use: 20 1 - Amount (size/oz): $60 worth of crack cocaine 1 - Frequency: Daily 1 - Duration: Patient reports that he has been using all of his life  1 - Last Use / Amount: today   CIWA: CIWA-Ar BP: 151/84 mmHg Pulse Rate: 74 COWS:    Allergies:  Allergies  Allergen Reactions  . Haldol [Haloperidol] Other (See Comments)    Muscle spasms, loss of voluntary movement. However, pt has taken Thorazine on multiple occasions with no adverse  effects.     Home Medications:  (Not in a hospital admission)  OB/GYN Status:  No LMP for male patient.  General Assessment Data Location of Assessment: [facility] ED TTS Assessment: In system Is this a Tele or Face-to-Face Assessment?: Face-to-Face Is this an Initial Assessment or a Re-assessment for this encounter?: Initial Assessment Marital status: Single Maiden name: NA Is patient pregnant?:  (NA) Pregnancy Status:  (NA) Living Arrangements: Other relatives (Reports that he lives with his cousin ) Can pt return to current living arrangement?: Yes Admission Status: Voluntary Is patient capable of signing voluntary admission?: Yes Referral  Source: Self/Family/Friend Insurance type: Medicare  Medical Screening Exam [facility] Walk-in ONLY) Medical Exam completed: Yes  Crisis Care Plan Living Arrangements: Other relatives (Reports that he lives with his cousin ) Name of Psychiatrist: None Name of Therapist: None  Education Status Is patient currently in school?: No Current Grade: NA Highest grade of school patient has completed: 12 Name of school: NA Contact person: NA  Risk to self with the past 6 months Suicidal Ideation: Yes-Currently Present Has patient been a risk to self within the past 6 months prior to admission? : Yes Suicidal Intent: Yes-Currently Present Has patient had any suicidal intent within the past 6 months prior to admission? : Yes Is patient at risk for suicide?: Yes Suicidal Plan?: No Has patient had any suicidal plan within the past 6 months prior to admission? : Yes Specify Current Suicidal Plan: Denies having a plan  Access to Means: No Specify Access to Suicidal Means: NA What has been your use of drugs/alcohol within the last 12 months?: Crack Cocaine Previous Attempts/Gestures: No How many times?: 0 Other Self Harm Risks: substance abuse Triggers for Past Attempts: None known Intentional Self Injurious Behavior: None Comment - Self Injurious Behavior: None Reported Family Suicide History: No Recent stressful life event(s): Job Loss, Financial Problems Persecutory voices/beliefs?: Yes Depression: Yes Depression Symptoms: Despondent, Insomnia, Isolating, Fatigue, Guilt, Loss of interest in usual pleasures, Feeling worthless/self pity, Feeling angry/irritable Substance abuse history and/or treatment for substance abuse?: Yes Suicide prevention information given to non-admitted patients: Yes  Risk to Others within the past 6 months Homicidal Ideation: No Does patient have any lifetime risk of violence toward others beyond the six months prior to admission? : No Thoughts of Harm to Others:  No Comment - Thoughts of Harm to Others: None Reported Current Homicidal Intent: No Current Homicidal Plan: No Describe Current Homicidal Plan: None Reported Access to Homicidal Means: No Describe Access to Homicidal Means: None  Identified Victim: NA History of harm to others?: No Assessment of Violence: None Noted Violent Behavior Description: None Reported Does patient have access to weapons?: No Criminal Charges Pending?: No Describe Pending Criminal Charges: None Reported Does patient have a court date: No Court Date:  (None Reported) Is patient on probation?:  (Patient reports that he was in jail for 7 days for treaspass)  Psychosis Hallucinations: Auditory (Deamonds telling him to smoke crack cocaine) Delusions: None noted  Mental Status Report Appearance/Hygiene: Disheveled, Poor hygiene, In scrubs Eye Contact: Poor Motor Activity: Freedom of movement, Agitation, Restlessness (Scratching his body during the assessment. ) Speech: Logical/coherent Level of Consciousness: Drowsy Mood: Depressed, Anxious, Suspicious, Helpless, Irritable, Worthless, low self-esteem Affect: Angry, Anxious, Irritable Anxiety Level: Minimal Thought Processes: Coherent, Relevant Judgement: Unimpaired Orientation: Person, Place, Time, Situation Obsessive Compulsive Thoughts/Behaviors: None  Cognitive Functioning Concentration: Decreased Memory: Recent Intact, Remote Intact IQ: Average Insight: Fair Impulse Control: Poor Appetite: Fair Weight Loss:  0 Weight Gain: 0 Sleep: Decreased Total Hours of Sleep: 3 Vegetative Symptoms: Decreased grooming (Body odor)  ADLScreening [facility] Assessment Services) Patient's cognitive ability adequate to safely complete daily activities?: Yes Patient able to express need for assistance with ADLs?: No Independently performs ADLs?: Yes (appropriate for developmental age)  Prior Inpatient Therapy Prior Inpatient Therapy: Yes Prior Therapy Dates:  Multiple Prior Therapy Facilty/Provider(s): [facility] and multiple others Reason for Treatment: Psychosis, SI/HI  Prior Outpatient Therapy Prior Outpatient Therapy: Yes Prior Therapy Dates: Multiple Prior Therapy Facilty/Provider(s): None currently; [name] in past Reason for Treatment: Med Mgmt Does patient have an ACCT team?: Unknown Does patient have Intensive In-House Services?  : No Does patient have [name] services? : Unknown Does patient have P4CC services?: No  ADL Screening (condition at time of admission) Patient's cognitive ability adequate to safely complete daily activities?: Yes Is the patient deaf or have difficulty hearing?: No Does the patient have difficulty seeing, even when wearing glasses/contacts?: No Does the patient have difficulty concentrating, remembering, or making decisions?: No Patient able to express need for assistance with ADLs?: No Does the patient have difficulty dressing or bathing?: No Independently performs ADLs?: Yes (appropriate for developmental age) Does the patient have difficulty walking or climbing stairs?: No Weakness of Legs: None Weakness of Arms/Hands: None  Home Assistive Devices/Equipment Home Assistive Devices/Equipment: None    Abuse/Neglect Assessment (Assessment to be complete while patient is alone) Physical Abuse: Denies Verbal Abuse: Denies Sexual Abuse: Denies Exploitation of patient/patient's resources: Denies Self-Neglect: Denies Values / Beliefs Cultural Requests During Hospitalization: None Spiritual Requests During Hospitalization: None Consults Spiritual Care Consult Needed: No Social Work Consult Needed: No Merchant navy officer (For Healthcare) Does patient have an advance directive?: No Would patient like information on creating an advanced directive?: No - patient declined information    Additional Information 1:1 In Past 12 Months?: No CIRT Risk: No Elopement Risk: No Does patient have medical clearance?:  Yes     Disposition: Per Julieanne Cotton, NP - patient will be reassessed by an extender in the morning for a final disposition.  Disposition Initial Assessment Completed for this Encounter: Yes Disposition of Patient: Other dispositions Other disposition(s): Other (Comment) (Per Julieanne Cotton, - pending psych disposition. )  On Site Evaluation by:   Reviewed with Physician:    Phillip Heal LaVerne 01/16/2015 12:27 PM

## 2015-01-16 NOTE — ED Notes (Addendum)
Pt returned from bathroom and changed into scrubs. Clothing removed from room. Pt upset because this nurse had brought him plain crackers instead of graham crackers into the room. Pt became agitated and requesting another nurse. Pt slammed door to room. Security at bedside.  MD aware.

## 2015-01-16 NOTE — ED Notes (Signed)
Bed: [id number] Expected date:  Expected time:  Means of arrival:  Comments: RM 16

## 2015-01-17 ENCOUNTER — Inpatient Hospital Stay
Admit: 2015-01-17 | Discharge: 2015-01-23 | DRG: 885 | Disposition: A | Discharge status: Home / Self Care | Payer: Medicare Other | Attending: Psychiatry | Admitting: Psychiatry

## 2015-01-17 ENCOUNTER — Encounter: Payer: Self-pay | Admitting: Psychiatry

## 2015-01-17 ENCOUNTER — Telehealth: Payer: Self-pay

## 2015-01-17 DIAGNOSIS — F1420 Cocaine dependence, uncomplicated: Secondary | ICD-10-CM | POA: Diagnosis present

## 2015-01-17 DIAGNOSIS — G4700 Insomnia, unspecified: Secondary | ICD-10-CM | POA: Diagnosis present

## 2015-01-17 DIAGNOSIS — Z8249 Family history of ischemic heart disease and other diseases of the circulatory system: Secondary | ICD-10-CM | POA: Diagnosis not present

## 2015-01-17 DIAGNOSIS — R45851 Suicidal ideations: Secondary | ICD-10-CM | POA: Diagnosis present

## 2015-01-17 DIAGNOSIS — F17210 Nicotine dependence, cigarettes, uncomplicated: Secondary | ICD-10-CM | POA: Diagnosis present

## 2015-01-17 DIAGNOSIS — E119 Type 2 diabetes mellitus without complications: Secondary | ICD-10-CM | POA: Diagnosis present

## 2015-01-17 DIAGNOSIS — I10 Essential (primary) hypertension: Secondary | ICD-10-CM | POA: Diagnosis present

## 2015-01-17 DIAGNOSIS — F200 Paranoid schizophrenia: Secondary | ICD-10-CM | POA: Diagnosis present

## 2015-01-17 DIAGNOSIS — Z833 Family history of diabetes mellitus: Secondary | ICD-10-CM

## 2015-01-17 LAB — CBG MONITORING, ED
GLUCOSE-CAPILLARY: 115 mg/dL — AB (ref 65–99)
Glucose-Capillary: 121 mg/dL — ABNORMAL HIGH (ref 65–99)

## 2015-01-17 MED ORDER — TRAZODONE HCL 100 MG PO TABS
100.0000 mg | ORAL_TABLET | Freq: Every evening | ORAL | Status: DC | PRN
  Administered 2015-01-17 – 2015-01-22 (×4): 100 mg via ORAL
  Filled 2015-01-17 (×4): qty 1

## 2015-01-17 MED ORDER — AMANTADINE HCL 100 MG PO CAPS
100.0000 mg | ORAL_CAPSULE | Freq: Two times a day (BID) | ORAL | Status: DC
  Administered 2015-01-17: 100 mg via ORAL
  Filled 2015-01-17 (×3): qty 1

## 2015-01-17 MED ORDER — NICOTINE 14 MG/24HR TD PT24
14.0000 mg | MEDICATED_PATCH | Freq: Every day | TRANSDERMAL | Status: DC
  Administered 2015-01-18: 14 mg via TRANSDERMAL
  Filled 2015-01-17: qty 1

## 2015-01-17 MED ORDER — ACETAMINOPHEN 325 MG PO TABS
650.0000 mg | ORAL_TABLET | Freq: Four times a day (QID) | ORAL | Status: DC | PRN
  Administered 2015-01-18 – 2015-01-23 (×8): 650 mg via ORAL
  Filled 2015-01-17 (×10): qty 2

## 2015-01-17 MED ORDER — MAGNESIUM HYDROXIDE 400 MG/5ML PO SUSP: 30.0000 mL | Freq: Every day | ORAL | Status: DC | PRN

## 2015-01-17 MED ORDER — ALUM & MAG HYDROXIDE-SIMETH 200-200-20 MG/5ML PO SUSP
30.0000 mL | ORAL | Status: DC | PRN
  Administered 2015-01-19: 30 mL via ORAL
  Filled 2015-01-17: qty 30

## 2015-01-17 MED ORDER — ARIPIPRAZOLE 5 MG PO TABS
5.0000 mg | ORAL_TABLET | Freq: Two times a day (BID) | ORAL | Status: DC
  Administered 2015-01-17: 5 mg via ORAL
  Filled 2015-01-17 (×2): qty 1

## 2015-01-17 NOTE — BH Assessment (Signed)
[facility] Assessment Progress Note  Per Thedore MinsMojeed Akintayo, MD this pt requires psychiatric hospitalization at this time.  [facility].  Pt has been accepted to their facility by Dr. Guss Bundehalla to Rm [phone].  They would prefer for pt to arrive after 19:00.  Please call report to [phone].  Nanine MeansJamison Lord, NP concurs with this decision.  Pt's nurse has been notified.  Doylene Canninghomas Aolanis Crispen, MA Triage Specialist [phone]

## 2015-01-17 NOTE — Progress Notes (Signed)
CM spoke with [facility] CM, Jane about pt TCC appointment and Cm attempting to [facility] with pt ACT team member Cm contacted ED SW to attempt to find ACT team staff associated with pt [id number] [phone] to confirm pt is not active with [facility]

## 2015-01-17 NOTE — Consult Note (Signed)
[facility] Consult   Reason for Consult:  Suicidal ideations Referring Physician:  EDP Patient Identification: [name] MRN:  [id number] Principal Diagnosis: Schizophrenia, paranoid type Diagnosis:   Patient Active Problem List   Diagnosis Date Noted  . Schizophrenia, paranoid type [F20.0] 01/17/2015    Priority: High  . 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 Chronic paranoid schizophrenia [F20.0] 09/07/2014    Priority: High  . Cocaine use disorder, severe, dependence [F14.20]     Priority: High  . Suicidal ideation [R45.851]   . 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 Acute CHF (congestive heart failure) [I50.9] 07/29/2014  . Essential hypertension, benign [I10] 03/28/2013  . Diabetes mellitus [E11.9] 03/15/2013    Total Time spent with patient: 45 minutes  Subjective:   [name] is a 54 y.o. male patient admitted with suicidal ideations and a plan to cut his head off.  HPI:  54 y.o. male that reports SI with a plan to kill himself. Patient repots that he has a plan to cut his wrist. Patient reports that he is not able to contract for safety. Patient reports that he has not been taking his medication. Patient reports that he does not know how long it has been since he has taken his psychiatric medication. Patient reports that the demons are telling him to kill himself and to smoke crack cocaine.   Patient reports that he has spend the last 7 days in jail for a charge of trespassing and when he go out he used $60.00 worth of crack cocaine. Patient deines any withdrawal symptoms. Patient reports that he uses $50.00 worth of crack cocaine daily for the, "better part of his life". Patient reports that he is not sure when he first began to use cocaine or how long he has been addicted to crack cocaine.   Today:  Patient continues to  endorse suicidal ideations with a plan to cut off his head.  He has not been taking his medications for awhile.  Irritable on assessment, will not contract for safety.  Reports hearing voices telling him to hurt himself HPI Elements:   Location:  generalized. Quality:  acute . Severity:  severe. Timing:  constant. Duration:  couple of days. Context:  stressors, cocaine abuse.  Past Medical History:  Past Medical History  Diagnosis Date  . Diabetes mellitus without complication   . Hypertension   . Schizophrenia   . CHF (congestive heart failure)   . Neuropathy   . Polysubstance abuse   . Cocaine abuse   . Homelessness   . Hepatitis C   . Chronic foot pain   . Homelessness    History reviewed. No pertinent past surgical history. Family History:  Family History  Problem Relation Age of Onset  . Hypertension Other   . Diabetes Other    Social History:  History  Alcohol Use  . Yes    Comment: Pt denies     History  Drug Use  . 7.00 per week  . Special: "Crack" cocaine, Cocaine, Marijuana    Comment: Cocaine tonight, Marijuana "a long time"    History   Social History  . Marital Status: Divorced    Spouse Name: N/A  . Number of Children: N/A  . Years of Education: N/A   Social History Main Topics  . Smoking status: Current Every Day Smoker -- 1.00 packs/day for 20 years  Types: Cigarettes  . Smokeless tobacco: Current User  . Alcohol Use: Yes     Comment: Pt denies  . Drug Use: 7.00 per week    Special: "Crack" cocaine, Cocaine, Marijuana     Comment: Cocaine tonight, Marijuana "a long time"  . Sexual Activity: No   Other Topics Concern  . None   Social History Narrative   ** Merged History Encounter **       Additional Social History:    History of alcohol / drug use?: Yes Longest period of sobriety (when/how long): None  Negative Consequences of Use: Museum/gallery curator, Scientist, research (physical sciences), Personal relationships, Work / Youth worker Withdrawal Symptoms:  (None  Reported) Name of Substance 1: Crack Cocaine  1 - Age of First Use: 20 1 - Amount (size/oz): $60 worth of crack cocaine 1 - Frequency: Daily 1 - Duration: Patient reports that he has been using all of his life  1 - Last Use / Amount: today                    Allergies:   Allergies  Allergen Reactions  . Haldol [Haloperidol] Other (See Comments)    Muscle spasms, loss of voluntary movement. However, pt has taken Thorazine on multiple occasions with no adverse effects.     Labs:  Results for orders placed or performed during the hospital encounter of 01/16/15 (from the past 48 hour(s))  Urine rapid drug screen (hosp performed)not at [facility]     Status: Abnormal   Collection Time: 01/16/15 11:52 AM  Result Value Ref Range   Opiates NONE DETECTED NONE DETECTED   Cocaine POSITIVE (A) NONE DETECTED   Benzodiazepines NONE DETECTED NONE DETECTED   Amphetamines NONE DETECTED NONE DETECTED   Tetrahydrocannabinol NONE DETECTED NONE DETECTED   Barbiturates NONE DETECTED NONE DETECTED    Comment:        DRUG SCREEN FOR MEDICAL PURPOSES ONLY.  IF CONFIRMATION IS NEEDED FOR ANY PURPOSE, NOTIFY LAB WITHIN 5 DAYS.        LOWEST DETECTABLE LIMITS FOR URINE DRUG SCREEN Drug Class       Cutoff (ng/mL) Amphetamine      1000 Barbiturate      200 Benzodiazepine   031 Tricyclics       594 Opiates          300 Cocaine          300 THC              50   CBC WITH DIFFERENTIAL     Status: Abnormal   Collection Time: 01/16/15 12:08 PM  Result Value Ref Range   WBC 4.7 4.0 - 10.5 K/uL   RBC 4.28 4.22 - 5.81 MIL/uL   Hemoglobin 12.3 (L) 13.0 - 17.0 g/dL   HCT 39.4 39.0 - 52.0 %   MCV 92.1 78.0 - 100.0 fL   MCH 28.7 26.0 - 34.0 pg   MCHC 31.2 30.0 - 36.0 g/dL   RDW 12.9 11.5 - 15.5 %   Platelets 161 150 - 400 K/uL   Neutrophils Relative % 63 43 - 77 %   Neutro Abs 2.9 1.7 - 7.7 K/uL   Lymphocytes Relative 27 12 - 46 %   Lymphs Abs 1.3 0.7 - 4.0 K/uL   Monocytes Relative 8 3 - 12 %    Monocytes Absolute 0.4 0.1 - 1.0 K/uL   Eosinophils Relative 2 0 - 5 %   Eosinophils Absolute 0.1 0.0 - 0.7 K/uL   Basophils  Relative 0 0 - 1 %   Basophils Absolute 0.0 0.0 - 0.1 K/uL  Comprehensive metabolic panel     Status: Abnormal   Collection Time: 01/16/15 12:08 PM  Result Value Ref Range   Sodium 142 135 - 145 mmol/L   Potassium 3.8 3.5 - 5.1 mmol/L   Chloride 109 101 - 111 mmol/L   CO2 27 22 - 32 mmol/L   Glucose, Bld 98 65 - 99 mg/dL   BUN 12 6 - 20 mg/dL   Creatinine, Ser 0.97 0.61 - 1.24 mg/dL   Calcium 9.1 8.9 - 10.3 mg/dL   Total Protein 6.6 6.5 - 8.1 g/dL   Albumin 3.7 3.5 - 5.0 g/dL   AST 20 15 - 41 U/L   ALT 10 (L) 17 - 63 U/L   Alkaline Phosphatase 86 38 - 126 U/L   Total Bilirubin 0.8 0.3 - 1.2 mg/dL   GFR calc non Af Amer >60 >60 mL/min   GFR calc Af Amer >60 >60 mL/min    Comment: (NOTE) The eGFR has been calculated using the CKD EPI equation. This calculation has not been validated in all clinical situations. eGFR's persistently <60 mL/min signify possible Chronic Kidney Disease.    Anion gap 6 5 - 15  Ethanol     Status: None   Collection Time: 01/16/15 12:13 PM  Result Value Ref Range   Alcohol, Ethyl (B) <5 <5 mg/dL    Comment:        LOWEST DETECTABLE LIMIT FOR SERUM ALCOHOL IS 5 mg/dL FOR MEDICAL PURPOSES ONLY   Salicylate level     Status: None   Collection Time: 01/16/15 12:13 PM  Result Value Ref Range   Salicylate Lvl <7.7 2.8 - 30.0 mg/dL  Acetaminophen level     Status: Abnormal   Collection Time: 01/16/15 12:13 PM  Result Value Ref Range   Acetaminophen (Tylenol), Serum <10 (L) 10 - 30 ug/mL    Comment:        THERAPEUTIC CONCENTRATIONS VARY SIGNIFICANTLY. A RANGE OF 10-30 ug/mL MAY BE AN EFFECTIVE CONCENTRATION FOR MANY PATIENTS. HOWEVER, SOME ARE BEST TREATED AT CONCENTRATIONS OUTSIDE THIS RANGE. ACETAMINOPHEN CONCENTRATIONS >150 ug/mL AT 4 HOURS AFTER INGESTION AND >50 ug/mL AT 12 HOURS AFTER INGESTION ARE OFTEN  ASSOCIATED WITH TOXIC REACTIONS.   CBG monitoring, ED     Status: Abnormal   Collection Time: 01/16/15  6:11 PM  Result Value Ref Range   Glucose-Capillary 108 (H) 65 - 99 mg/dL   Comment 1 Notify RN   CBG monitoring, ED     Status: Abnormal   Collection Time: 01/16/15  9:08 PM  Result Value Ref Range   Glucose-Capillary 104 (H) 65 - 99 mg/dL  CBG monitoring, ED     Status: Abnormal   Collection Time: 01/17/15  8:26 AM  Result Value Ref Range   Glucose-Capillary 121 (H) 65 - 99 mg/dL   Comment 1 Notify RN     Vitals: Blood pressure 157/77, pulse 63, temperature 98.5 F (36.9 C), temperature source Oral, resp. rate 20, SpO2 98 %.  Risk to Self: Suicidal Ideation: Yes-Currently Present Suicidal Intent: Yes-Currently Present Is patient at risk for suicide?: Yes Suicidal Plan?: No Specify Current Suicidal Plan: Denies having a plan  Access to Means: No Specify Access to Suicidal Means: NA What has been your use of drugs/alcohol within the last 12 months?: Crack Cocaine How many times?: 0 Other Self Harm Risks: substance abuse Triggers for Past Attempts: None known Intentional  Self Injurious Behavior: None Comment - Self Injurious Behavior: None Reported Risk to Others: Homicidal Ideation: No Thoughts of Harm to Others: No Comment - Thoughts of Harm to Others: None Reported Current Homicidal Intent: No Current Homicidal Plan: No Describe Current Homicidal Plan: None Reported Access to Homicidal Means: No Describe Access to Homicidal Means: None  Identified Victim: NA History of harm to others?: No Assessment of Violence: None Noted Violent Behavior Description: None Reported Does patient have access to weapons?: No Criminal Charges Pending?: No Describe Pending Criminal Charges: None Reported Does patient have a court date: No Court Date:  (None Reported) Prior Inpatient Therapy: Prior Inpatient Therapy: Yes Prior Therapy Dates: Multiple Prior Therapy  Facilty/Provider(s): [facility] and multiple others Reason for Treatment: Psychosis, SI/HI Prior Outpatient Therapy: Prior Outpatient Therapy: Yes Prior Therapy Dates: Multiple Prior Therapy Facilty/Provider(s): None currently; [name] in past Reason for Treatment: Med Mgmt Does patient have an ACCT team?: Unknown Does patient have Intensive In-House Services?  : No Does patient have [name] services? : Unknown Does patient have P4CC services?: No  Current Facility-Administered Medications  Medication Dose Route Frequency Provider Last Rate Last Dose  . acetaminophen (TYLENOL) tablet 650 mg  650 mg Oral Q4H PRN Mercedes Camprubi-Soms, PA-C   650 mg at 01/16/15 1225  . alum & mag hydroxide-simeth (MAALOX/MYLANTA) 200-200-20 MG/5ML suspension 30 mL  30 mL Oral PRN Mercedes Camprubi-Soms, PA-C      . amantadine (SYMMETREL) capsule 100 mg  100 mg Oral BID Criston Chancellor   100 mg at 01/17/15 1211  . ARIPiprazole (ABILIFY) tablet 5 mg  5 mg Oral BID PC Koraima Albertsen      . benazepril (LOTENSIN) tablet 5 mg  5 mg Oral Daily Mercedes Camprubi-Soms, PA-C   5 mg at 01/17/15 1017  . cloNIDine (CATAPRES) tablet 0.1 mg  0.1 mg Oral BID Mercedes Camprubi-Soms, PA-C   0.1 mg at 01/17/15 1017  . furosemide (LASIX) tablet 20 mg  20 mg Oral Daily Mercedes Camprubi-Soms, PA-C   20 mg at 01/17/15 1017  . ibuprofen (ADVIL,MOTRIN) tablet 600 mg  600 mg Oral Q8H PRN Mercedes Camprubi-Soms, PA-C      . LORazepam (ATIVAN) tablet 1 mg  1 mg Oral Q8H PRN Mercedes Camprubi-Soms, PA-C   1 mg at 01/17/15 1211  . metFORMIN (GLUCOPHAGE) tablet 500 mg  500 mg Oral Q breakfast Mercedes Camprubi-Soms, PA-C   500 mg at 01/17/15 0831  . nicotine (NICODERM CQ - dosed in mg/24 hours) patch 21 mg  21 mg Transdermal Daily Mercedes Camprubi-Soms, PA-C   21 mg at 01/17/15 1018  . ondansetron (ZOFRAN) tablet 4 mg  4 mg Oral Q8H PRN Mercedes Camprubi-Soms, PA-C      . tamsulosin (FLOMAX) capsule 0.4 mg  0.4 mg Oral QPC supper Mercedes  Camprubi-Soms, PA-C   0.4 mg at 01/16/15 1713  . traZODone (DESYREL) tablet 50 mg  50 mg Oral QHS PRN Mercedes Camprubi-Soms, PA-C       No current outpatient prescriptions on file.    Musculoskeletal: Strength & Muscle Tone: within normal limits Gait & Station: normal Patient leans: N/A  Psychiatric Specialty Exam: Physical Exam  Review of Systems  Constitutional: Negative.   HENT: Negative.   Eyes: Negative.   Respiratory: Negative.   Cardiovascular: Negative.   Gastrointestinal: Negative.   Genitourinary: Negative.   Musculoskeletal: Negative.   Skin: Negative.   Neurological: Negative.   Endo/Heme/Allergies: Negative.   Psychiatric/Behavioral: Positive for suicidal ideas and substance abuse.  Blood pressure 157/77, pulse 63, temperature 98.5 F (36.9 C), temperature source Oral, resp. rate 20, SpO2 98 %.There is no weight on file to calculate BMI.  General Appearance: Casual  Eye Contact::  Good  Speech:  Normal Rate  Volume:  Normal  Mood:  Depressed and Irritable  Affect:  Blunt  Thought Process:  Coherent  Orientation:  Full (Time, Place, and Person)  Thought Content:  Hallucinations: Auditory  Suicidal Thoughts:  Yes.  with intent/plan  Homicidal Thoughts:  No  Memory:  Immediate;   Fair Recent;   Fair Remote;   Fair  Judgement:  Poor  Insight:  Fair  Psychomotor Activity:  Decreased  Concentration:  Fair  Recall:  [employer] of Knowledge:Fair  Language: Good  Akathisia:  No  Handed:  Right  AIMS (if indicated):     Assets:  Leisure Time Resilience  ADL's:  Intact  Cognition: WNL  Sleep:      Medical Decision Making: Review of Psycho-Social Stressors (1), Review or order clinical lab tests (1) and Review of Medication Regimen & Side Effects (2)  Treatment Plan Summary: Diagnosis:  Schizophrenia, paranoid type; cocaine dependence Daily contact with patient to assess and evaluate symptoms and progress in treatment, Medication management and Plan  admit to inpatient psychiatric unit for stabilization of mood, medications started:  Symmetrel 100 mg BID for substance abuse, Abilify 5 mg daily for psychosis, Lotensin 5 mg daily for HTN, Clonidine 0.1 mg BID for HTN and withdrawal symptoms, Lasix 20 mg daily for HTN, Metformin 500 mg daily for DMII, flomax 0.4 mg daily for BPH, and Trazodone 50 mg at bedtime for insomnia.  Admit to inpatient psychiatric unit for stabilization.  Plan:  Recommend psychiatric Inpatient admission when medically cleared. Disposition: Admit to [facility], [place] 01/17/2015 2:11 PM Patient seen face-to-face for psychiatric evaluation, chart reviewed and case discussed with the physician extender and developed treatment plan. Reviewed the information documented and agree with the treatment plan. Corena Pilgrim, MD

## 2015-01-17 NOTE — Telephone Encounter (Signed)
Referral received for TCC; however the patient it not appropriate for TCC follow up.  His needs were discussed with Marval RegalKim Gibbs ,CM and he has been scheduled for a hospital [facility] 01/[facility]/15 @ 1200.

## 2015-01-18 MED ORDER — CHLORPROMAZINE HCL 25 MG PO TABS
50.0000 mg | ORAL_TABLET | Freq: Two times a day (BID) | ORAL | Status: DC
  Administered 2015-01-18 – 2015-01-21 (×7): 50 mg via ORAL
  Filled 2015-01-18 (×7): qty 2

## 2015-01-18 NOTE — [facility] Group Notes (Signed)
[facility]  01/18/2015 10:16 AM  Participation Quality:  Appropriate  Affect:  Appropriate  Cognitive:  Alert  Insight:  Defensive  Engagement in Group:  Defensive  Modes of Intervention:  Support  Summary of Progress/Problems:Patients smart goal is to let his peers know that he gets aggitated and stressed with sudden moments or noises. He is not violent but wanted everyone in group to know this  Cheron SchaumannBandi, Jasleen Riepe M 01/18/2015, 10:16 AM

## 2015-01-18 NOTE — Plan of Care (Signed)
Problem: Diagnosis: Increased Risk For Suicide Attempt Goal: STG-Patient Will Report Suicidal Feelings to Staff Outcome: Progressing Patient currently denies SI/HI.

## 2015-01-18 NOTE — H&P (Signed)
Psychiatric Admission Assessment Adult  Patient Identification: HALSTON KINTZ MRN:  [id number] Date of Evaluation:  01/18/2015 Chief Complaint:  schizoaffective Cocaine Abuse Principal Diagnosis: Schizophrenia, paranoid type Diagnosis:   Patient Active Problem List   Diagnosis Date Noted  . Schizophrenia, paranoid type [F20.0] 01/17/2015  . Suicidal ideation [R45.851]   . 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 Chronic paranoid schizophrenia [F20.0] 09/07/2014  . 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 Acute CHF (congestive heart failure) [I50.9] 07/29/2014  . Cocaine use disorder, severe, dependence [F14.20]   . Essential hypertension, benign [I10] 03/28/2013  . Diabetes mellitus [E11.9] 03/15/2013   History of Present Illness::Pt is a 54 yr old [name], not employed and is on disability and gets abotu $903 every month and was in  [place] for 4 yrs for "spitting on a Corp." Elements:   Associated Signs/Symptoms: Depression Symptoms:  depressed mood, insomnia, psychomotor agitation, hopelessness, suicidal thoughts without plan, anxiety, disturbed sleep, increased appetite, (Hypo) Manic Symptoms:  Hallucinations, Anxiety Symptoms:  Hearing voices telling him to hurt himelf and people trying to distract him. Psychotic Symptoms:  Hallucinations: Auditory Command:  telling him to hurt himself Tactile PTSD Symptoms: Negative Total Time spent with patient: 1 hour  Past Medical History:  Past Medical History  Diagnosis Date  . Diabetes mellitus without complication   . Hypertension   . Schizophrenia   . CHF (congestive heart failure)   . Neuropathy   . Polysubstance abuse   . Cocaine abuse   . Homelessness   . Hepatitis C   . Chronic foot pain   . Homelessness    History reviewed. No pertinent past surgical history. Family History:  Family History  Problem Relation Age of Onset  .  Hypertension Other   . Diabetes Other    Social History:  History  Alcohol Use  . Yes    Comment: Pt denies     History  Drug Use  . 7.00 per week  . Special: "Crack" cocaine, Cocaine, Marijuana    Comment: Cocaine tonight, Marijuana "a long time"    History   Social History  . Marital Status: Divorced    Spouse Name: N/A  . Number of Children: N/A  . Years of Education: N/A   Social History Main Topics  . Smoking status: Current Every Day Smoker -- 1.00 packs/day for 20 years    Types: Cigarettes  . Smokeless tobacco: Current User  . Alcohol Use: Yes     Comment: Pt denies  . Drug Use: 7.00 per week    Special: "Crack" cocaine, Cocaine, Marijuana     Comment: Cocaine tonight, Marijuana "a long time"  . Sexual Activity: No   Other Topics Concern  . None   Social History Narrative   ** Merged History Encounter **       Additional Social History:                          Musculoskeletal: Strength & Muscle Tone: within normal limits Gait & Station: normal Patient leans: N/A  Psychiatric Specialty Exam: Physical Exam  ROS  Blood pressure 173/102, pulse 80, temperature 98 F (36.7 C), temperature source Oral, resp. rate 18, height '5\' 9"'  (1.753 m), weight 79.379 kg (175 lb), SpO2 100 %.Body mass index is 25.83 kg/(m^2).   Risk to Self: Is patient at risk for suicide?: No Risk to Others:   Prior Inpatient Therapy:  Prior Outpatient Therapy:   Please refer to Suicide Assessment  Alcohol Screening: 1. How often do you have a drink containing alcohol?: 2 to 4 times a month 2. How many drinks containing alcohol do you have on a typical day when you are drinking?: 3 or 4 3. How often do you have six or more drinks on one occasion?: Never Preliminary Score: 1 4. How often during the last year have you found that you were not able to stop drinking once you had started?: Never 5. How often during the last year have you failed to do what was normally  expected from you becasue of drinking?: Never 6. How often during the last year have you needed a first drink in the morning to get yourself going after a heavy drinking session?: Never 7. How often during the last year have you had a feeling of guilt of remorse after drinking?: Less than monthly 8. How often during the last year have you been unable to remember what happened the night before because you had been drinking?: Never 9. Have you or someone else been injured as a result of your drinking?: No 10. Has a relative or friend or a doctor or another health worker been concerned about your drinking or suggested you cut down?: No Alcohol Use Disorder Identification Test Final Score (AUDIT): 4 Brief Intervention: AUDIT score less than 7 or less-screening does not suggest unhealthy drinking-brief intervention not indicated  Allergies:   Allergies  Allergen Reactions  . Haldol [Haloperidol] Other (See Comments)    Muscle spasms, loss of voluntary movement. However, pt has taken Thorazine on multiple occasions with no adverse effects.    Lab Results:  Results for orders placed or performed during the hospital encounter of 01/16/15 (from the past 48 hour(s))  Urine rapid drug screen (hosp performed)not at [facility]     Status: Abnormal   Collection Time: 01/16/15 11:52 AM  Result Value Ref Range   Opiates NONE DETECTED NONE DETECTED   Cocaine POSITIVE (A) NONE DETECTED   Benzodiazepines NONE DETECTED NONE DETECTED   Amphetamines NONE DETECTED NONE DETECTED   Tetrahydrocannabinol NONE DETECTED NONE DETECTED   Barbiturates NONE DETECTED NONE DETECTED    Comment:        DRUG SCREEN FOR MEDICAL PURPOSES ONLY.  IF CONFIRMATION IS NEEDED FOR ANY PURPOSE, NOTIFY LAB WITHIN 5 DAYS.        LOWEST DETECTABLE LIMITS FOR URINE DRUG SCREEN Drug Class       Cutoff (ng/mL) Amphetamine      1000 Barbiturate      200 Benzodiazepine   237 Tricyclics       628 Opiates          300 Cocaine           300 THC              50   CBC WITH DIFFERENTIAL     Status: Abnormal   Collection Time: 01/16/15 12:08 PM  Result Value Ref Range   WBC 4.7 4.0 - 10.5 K/uL   RBC 4.28 4.22 - 5.81 MIL/uL   Hemoglobin 12.3 (L) 13.0 - 17.0 g/dL   HCT 39.4 39.0 - 52.0 %   MCV 92.1 78.0 - 100.0 fL   MCH 28.7 26.0 - 34.0 pg   MCHC 31.2 30.0 - 36.0 g/dL   RDW 12.9 11.5 - 15.5 %   Platelets 161 150 - 400 K/uL   Neutrophils Relative % 63 43 - 77 %  Neutro Abs 2.9 1.7 - 7.7 K/uL   Lymphocytes Relative 27 12 - 46 %   Lymphs Abs 1.3 0.7 - 4.0 K/uL   Monocytes Relative 8 3 - 12 %   Monocytes Absolute 0.4 0.1 - 1.0 K/uL   Eosinophils Relative 2 0 - 5 %   Eosinophils Absolute 0.1 0.0 - 0.7 K/uL   Basophils Relative 0 0 - 1 %   Basophils Absolute 0.0 0.0 - 0.1 K/uL  Comprehensive metabolic panel     Status: Abnormal   Collection Time: 01/16/15 12:08 PM  Result Value Ref Range   Sodium 142 135 - 145 mmol/L   Potassium 3.8 3.5 - 5.1 mmol/L   Chloride 109 101 - 111 mmol/L   CO2 27 22 - 32 mmol/L   Glucose, Bld 98 65 - 99 mg/dL   BUN 12 6 - 20 mg/dL   Creatinine, Ser 0.97 0.61 - 1.24 mg/dL   Calcium 9.1 8.9 - 10.3 mg/dL   Total Protein 6.6 6.5 - 8.1 g/dL   Albumin 3.7 3.5 - 5.0 g/dL   AST 20 15 - 41 U/L   ALT 10 (L) 17 - 63 U/L   Alkaline Phosphatase 86 38 - 126 U/L   Total Bilirubin 0.8 0.3 - 1.2 mg/dL   GFR calc non Af Amer >60 >60 mL/min   GFR calc Af Amer >60 >60 mL/min    Comment: (NOTE) The eGFR has been calculated using the CKD EPI equation. This calculation has not been validated in all clinical situations. eGFR's persistently <60 mL/min signify possible Chronic Kidney Disease.    Anion gap 6 5 - 15  Ethanol     Status: None   Collection Time: 01/16/15 12:13 PM  Result Value Ref Range   Alcohol, Ethyl (B) <5 <5 mg/dL    Comment:        LOWEST DETECTABLE LIMIT FOR SERUM ALCOHOL IS 5 mg/dL FOR MEDICAL PURPOSES ONLY   Salicylate level     Status: None   Collection Time: 01/16/15 12:13  PM  Result Value Ref Range   Salicylate Lvl <5.8 2.8 - 30.0 mg/dL  Acetaminophen level     Status: Abnormal   Collection Time: 01/16/15 12:13 PM  Result Value Ref Range   Acetaminophen (Tylenol), Serum <10 (L) 10 - 30 ug/mL    Comment:        THERAPEUTIC CONCENTRATIONS VARY SIGNIFICANTLY. A RANGE OF 10-30 ug/mL MAY BE AN EFFECTIVE CONCENTRATION FOR MANY PATIENTS. HOWEVER, SOME ARE BEST TREATED AT CONCENTRATIONS OUTSIDE THIS RANGE. ACETAMINOPHEN CONCENTRATIONS >150 ug/mL AT 4 HOURS AFTER INGESTION AND >50 ug/mL AT 12 HOURS AFTER INGESTION ARE OFTEN ASSOCIATED WITH TOXIC REACTIONS.   CBG monitoring, ED     Status: Abnormal   Collection Time: 01/16/15  6:11 PM  Result Value Ref Range   Glucose-Capillary 108 (H) 65 - 99 mg/dL   Comment 1 Notify RN   CBG monitoring, ED     Status: Abnormal   Collection Time: 01/16/15  9:08 PM  Result Value Ref Range   Glucose-Capillary 104 (H) 65 - 99 mg/dL  CBG monitoring, ED     Status: Abnormal   Collection Time: 01/17/15  8:26 AM  Result Value Ref Range   Glucose-Capillary 121 (H) 65 - 99 mg/dL   Comment 1 Notify RN   CBG monitoring, ED     Status: Abnormal   Collection Time: 01/17/15 11:50 AM  Result Value Ref Range   Glucose-Capillary 115 (H) 65 - 99 mg/dL  Comment 1 Notify RN    Current Medications: Current Facility-Administered Medications  Medication Dose Route Frequency Provider Last Rate Last Dose  . acetaminophen (TYLENOL) tablet 650 mg  650 mg Oral Q6H PRN Clovis Fredrickson, MD   650 mg at 01/18/15 1108  . alum & mag hydroxide-simeth (MAALOX/MYLANTA) 200-200-20 MG/5ML suspension 30 mL  30 mL Oral Q4H PRN Jolanta B Pucilowska, MD      . magnesium hydroxide (MILK OF MAGNESIA) suspension 30 mL  30 mL Oral Daily PRN Jolanta B Pucilowska, MD      . nicotine (NICODERM CQ - dosed in mg/24 hours) patch 14 mg  14 mg Transdermal Q0600 Clovis Fredrickson, MD   14 mg at 01/18/15 0646  . traZODone (DESYREL) tablet 100 mg  100 mg Oral  QHS PRN Clovis Fredrickson, MD   100 mg at 01/17/15 2346   PTA Medications: No prescriptions prior to admission    Previous Psychotropic Medications: Yes Pt reports that Thorazine has helped the best in the pastThorazine 50 mgs po bid helps him the best. Non complaint and has not had his meds for past 6 months.  Substance Abuse History in the last 12 months:  Yes.  Pt has been on the streets usingocaine and taking Cocaine at the rate of gram a day smoking and  Last used it 2 days ago.    Consequences of Substance Abuse: Legal Consequences:  Been to prison 9 times and has court date in August 5th 2016 .  Results for orders placed or performed during the hospital encounter of 01/16/15 (from the past 72 hour(s))  Urine rapid drug screen (hosp performed)not at [facility]     Status: Abnormal   Collection Time: 01/16/15 11:52 AM  Result Value Ref Range   Opiates NONE DETECTED NONE DETECTED   Cocaine POSITIVE (A) NONE DETECTED   Benzodiazepines NONE DETECTED NONE DETECTED   Amphetamines NONE DETECTED NONE DETECTED   Tetrahydrocannabinol NONE DETECTED NONE DETECTED   Barbiturates NONE DETECTED NONE DETECTED    Comment:        DRUG SCREEN FOR MEDICAL PURPOSES ONLY.  IF CONFIRMATION IS NEEDED FOR ANY PURPOSE, NOTIFY LAB WITHIN 5 DAYS.        LOWEST DETECTABLE LIMITS FOR URINE DRUG SCREEN Drug Class       Cutoff (ng/mL) Amphetamine      1000 Barbiturate      200 Benzodiazepine   242 Tricyclics       683 Opiates          300 Cocaine          300 THC              50   CBC WITH DIFFERENTIAL     Status: Abnormal   Collection Time: 01/16/15 12:08 PM  Result Value Ref Range   WBC 4.7 4.0 - 10.5 K/uL   RBC 4.28 4.22 - 5.81 MIL/uL   Hemoglobin 12.3 (L) 13.0 - 17.0 g/dL   HCT 39.4 39.0 - 52.0 %   MCV 92.1 78.0 - 100.0 fL   MCH 28.7 26.0 - 34.0 pg   MCHC 31.2 30.0 - 36.0 g/dL   RDW 12.9 11.5 - 15.5 %   Platelets 161 150 - 400 K/uL   Neutrophils Relative % 63 43 - 77 %   Neutro Abs 2.9  1.7 - 7.7 K/uL   Lymphocytes Relative 27 12 - 46 %   Lymphs Abs 1.3 0.7 - 4.0 K/uL   Monocytes  Relative 8 3 - 12 %   Monocytes Absolute 0.4 0.1 - 1.0 K/uL   Eosinophils Relative 2 0 - 5 %   Eosinophils Absolute 0.1 0.0 - 0.7 K/uL   Basophils Relative 0 0 - 1 %   Basophils Absolute 0.0 0.0 - 0.1 K/uL  Comprehensive metabolic panel     Status: Abnormal   Collection Time: 01/16/15 12:08 PM  Result Value Ref Range   Sodium 142 135 - 145 mmol/L   Potassium 3.8 3.5 - 5.1 mmol/L   Chloride 109 101 - 111 mmol/L   CO2 27 22 - 32 mmol/L   Glucose, Bld 98 65 - 99 mg/dL   BUN 12 6 - 20 mg/dL   Creatinine, Ser 0.97 0.61 - 1.24 mg/dL   Calcium 9.1 8.9 - 10.3 mg/dL   Total Protein 6.6 6.5 - 8.1 g/dL   Albumin 3.7 3.5 - 5.0 g/dL   AST 20 15 - 41 U/L   ALT 10 (L) 17 - 63 U/L   Alkaline Phosphatase 86 38 - 126 U/L   Total Bilirubin 0.8 0.3 - 1.2 mg/dL   GFR calc non Af Amer >60 >60 mL/min   GFR calc Af Amer >60 >60 mL/min    Comment: (NOTE) The eGFR has been calculated using the CKD EPI equation. This calculation has not been validated in all clinical situations. eGFR's persistently <60 mL/min signify possible Chronic Kidney Disease.    Anion gap 6 5 - 15  Ethanol     Status: None   Collection Time: 01/16/15 12:13 PM  Result Value Ref Range   Alcohol, Ethyl (B) <5 <5 mg/dL    Comment:        LOWEST DETECTABLE LIMIT FOR SERUM ALCOHOL IS 5 mg/dL FOR MEDICAL PURPOSES ONLY   Salicylate level     Status: None   Collection Time: 01/16/15 12:13 PM  Result Value Ref Range   Salicylate Lvl <7.0 2.8 - 30.0 mg/dL  Acetaminophen level     Status: Abnormal   Collection Time: 01/16/15 12:13 PM  Result Value Ref Range   Acetaminophen (Tylenol), Serum <10 (L) 10 - 30 ug/mL    Comment:        THERAPEUTIC CONCENTRATIONS VARY SIGNIFICANTLY. A RANGE OF 10-30 ug/mL MAY BE AN EFFECTIVE CONCENTRATION FOR MANY PATIENTS. HOWEVER, SOME ARE BEST TREATED AT CONCENTRATIONS OUTSIDE  THIS RANGE. ACETAMINOPHEN CONCENTRATIONS >150 ug/mL AT 4 HOURS AFTER INGESTION AND >50 ug/mL AT 12 HOURS AFTER INGESTION ARE OFTEN ASSOCIATED WITH TOXIC REACTIONS.   CBG monitoring, ED     Status: Abnormal   Collection Time: 01/16/15  6:11 PM  Result Value Ref Range   Glucose-Capillary 108 (H) 65 - 99 mg/dL   Comment 1 Notify RN   CBG monitoring, ED     Status: Abnormal   Collection Time: 01/16/15  9:08 PM  Result Value Ref Range   Glucose-Capillary 104 (H) 65 - 99 mg/dL  CBG monitoring, ED     Status: Abnormal   Collection Time: 01/17/15  8:26 AM  Result Value Ref Range   Glucose-Capillary 121 (H) 65 - 99 mg/dL   Comment 1 Notify RN   CBG monitoring, ED     Status: Abnormal   Collection Time: 01/17/15 11:50 AM  Result Value Ref Range   Glucose-Capillary 115 (H) 65 - 99 mg/dL   Comment 1 Notify RN     Observation Level/Precautions:  routine  Laboratory:  routine  Psychotherapy:    Medications:    Consultations:  Discharge Concerns:    Estimated LOS:  Other:     Psychological Evaluations: No   Treatment Plan Summary: Plan Start pt on Thorazine 50 mgs po bid and change to a regular dies As requested by him. and stabilize and discharge with folow up appts.  Medical Decision Making:  Review of Psycho-Social Stressors (1), Review of New Medication or Change in Dosage (2) and Review or Order of Psychological Test(s) (1)  I certify that inpatient services furnished can reasonably be expected to improve the patient's condition.   Dewain Penning 7/20/201611:37 AM

## 2015-01-18 NOTE — Progress Notes (Signed)
[name] 54 y.o [name], long hx of mental illness, also has diagnosis of HTN, DM, Hepatitis C, CHF and poly susbstance abuse.  Patient is admitted IVC, for suicidal ideation and substance abuse under care of Dr Jennet MaduroPucilowska. Per report respondent stated he wanted to cut his head off and hurt people. Upon arrival to the unit patient denies SI/HI/AVH, affect is flat,sad and irritable, skin assessment done; skin warm and dry, no contraband found,oriented patient  to the unit and admission procedure initiated. Per respondent he stated  his stressors are legal issues, homelessness, jobless and health issues. Patient was offered food, and showed his room, 15 minutes checks maintained will continue to monitor .

## 2015-01-18 NOTE — Tx Team (Signed)
Initial Interdisciplinary Treatment Plan   PATIENT STRESSORS: Health problems Legal issue Loss of housing Substance abuse   PATIENT STRENGTHS: Ability for insight Capable of independent living Motivation for treatment/growth   PROBLEM LIST: Problem List/Patient Goals Date to be addressed Date deferred Reason deferred Estimated date of resolution  Suicidal ideation 01/17/15           Substance abuse. 01/17/15                                          DISCHARGE CRITERIA:  Adequate post-discharge living arrangements Improved stabilization in mood, thinking, and/or behavior Motivation to continue treatment in a less acute level of care  PRELIMINARY DISCHARGE PLAN: Attend 12-step recovery group Outpatient therapy Placement in alternative living arrangements  PATIENT/FAMIILY INVOLVEMENT: This treatment plan has been presented to and reviewed with Taylor patient, [name],Taylor patient and family have been given Taylor opportunity to ask questions and make suggestions.  [name] 01/18/2015, 2:12 AM

## 2015-01-18 NOTE — Progress Notes (Signed)
[facility] MD Progress Note  01/18/2015 11:31 AM [name]  MRN:  [id number] Subjective:  Pt isa  54 yr old [name] with a long H/O Schizophrenia and been to [facility] and was referred here for Inpt hospitalization " suicidal ideas to cur himself but contracts for safety." Principal Problem: Schizophrenia, paranoid type Diagnosis:   Patient Active Problem List   Diagnosis Date Noted  . Schizophrenia, paranoid type [F20.0] 01/17/2015  . Suicidal ideation [R45.851]   . 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 Chronic paranoid schizophrenia [F20.0] 09/07/2014  . 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 Acute CHF (congestive heart failure) [I50.9] 07/29/2014  . Cocaine use disorder, severe, dependence [F14.20]   . Essential hypertension, benign [I10] 03/28/2013  . Diabetes mellitus [E11.9] 03/15/2013   Total Time spent with patient: 1 hour   Past Medical History:  Past Medical History  Diagnosis Date  . Diabetes mellitus without complication   . Hypertension   . Schizophrenia   . CHF (congestive heart failure)   . Neuropathy   . Polysubstance abuse   . Cocaine abuse   . Homelessness   . Hepatitis C   . Chronic foot pain   . Homelessness    History reviewed. No pertinent past surgical history. Family History:  Family History  Problem Relation Age of Onset  . Hypertension Other   . Diabetes Other    Social History:  History  Alcohol Use  . Yes    Comment: Pt denies     History  Drug Use  . 7.00 per week  . Special: "Crack" cocaine, Cocaine, Marijuana    Comment: Cocaine tonight, Marijuana "a long time"    History   Social History  . Marital Status: Divorced    Spouse Name: N/A  . Number of Children: N/A  . Years of Education: N/A   Social History Main Topics  . Smoking status: Current Every Day Smoker -- 1.00 packs/day for 20 years    Types: Cigarettes  . Smokeless  tobacco: Current User  . Alcohol Use: Yes     Comment: Pt denies  . Drug Use: 7.00 per week    Special: "Crack" cocaine, Cocaine, Marijuana     Comment: Cocaine tonight, Marijuana "a long time"  . Sexual Activity: No   Other Topics Concern  . None   Social History Narrative   ** Merged History Encounter **       Additional History:    Sleep: Fair  Appetite:  Fair   Assessment:   Musculoskeletal: Strength & Muscle Tone: within normal limits Gait & Station: normal Patient leans: N/A   Psychiatric Specialty Exam: Physical Exam  Nursing note and vitals reviewed.   ROS  Blood pressure 173/102, pulse 80, temperature 98 F (36.7 C), temperature source Oral, resp. rate 18, height _0  (1.753 m), weight 79.379 kg (175 lb), SpO2 100 %.Body mass index is 25.83 kg/(m^2).  General Appearance: Casual  Eye Contact::  Fair  Speech:  Normal Rate  Volume:  Normal  Mood:  Angry, Anxious, Depressed, Dysphoric, Hopeless and helpless  Affect:  Appropriate  Thought Process:  Disorganized  Orientation:  Full (Time, Place, and Person)  Thought Content:  Hallucinations: Auditory Command:  Telling him to hurt himelf and are distracting him. Visual  Suicidal Thoughts:  Yes.  without intent/plan  Homicidal Thoughts:  No  Memory:  Negative Immediate;   Fair Recent;   Fair Remote;  Fair adequate  Judgement:  Poor  Insight:  Lacking  Psychomotor Activity:  Normal  Concentration:  Poor  Recall:  [employer] of Knowledge:Fair  Language: Fair  Akathisia:  No  Handed:  Right  AIMS (if indicated):     Assets:  Communication Skills Desire for Improvement Housing Physical Health  ADL's:  Intact  Cognition: WNL  Sleep:  Number of Hours: 4.5     Current Medications: Current Facility-Administered Medications  Medication Dose Route Frequency Provider Last Rate Last Dose  . acetaminophen (TYLENOL) tablet 650 mg  650 mg Oral Q6H PRN Clovis Fredrickson, MD   650 mg at 01/18/15 1108   . alum & mag hydroxide-simeth (MAALOX/MYLANTA) 200-200-20 MG/5ML suspension 30 mL  30 mL Oral Q4H PRN Jolanta B Pucilowska, MD      . magnesium hydroxide (MILK OF MAGNESIA) suspension 30 mL  30 mL Oral Daily PRN Jolanta B Pucilowska, MD      . nicotine (NICODERM CQ - dosed in mg/24 hours) patch 14 mg  14 mg Transdermal Q0600 Clovis Fredrickson, MD   14 mg at 01/18/15 0646  . traZODone (DESYREL) tablet 100 mg  100 mg Oral QHS PRN Clovis Fredrickson, MD   100 mg at 01/17/15 2346    Lab Results:  Results for orders placed or performed during the hospital encounter of 01/16/15 (from the past 48 hour(s))  Urine rapid drug screen (hosp performed)not at [facility]     Status: Abnormal   Collection Time: 01/16/15 11:52 AM  Result Value Ref Range   Opiates NONE DETECTED NONE DETECTED   Cocaine POSITIVE (A) NONE DETECTED   Benzodiazepines NONE DETECTED NONE DETECTED   Amphetamines NONE DETECTED NONE DETECTED   Tetrahydrocannabinol NONE DETECTED NONE DETECTED   Barbiturates NONE DETECTED NONE DETECTED    Comment:        DRUG SCREEN FOR MEDICAL PURPOSES ONLY.  IF CONFIRMATION IS NEEDED FOR ANY PURPOSE, NOTIFY LAB WITHIN 5 DAYS.        LOWEST DETECTABLE LIMITS FOR URINE DRUG SCREEN Drug Class       Cutoff (ng/mL) Amphetamine      1000 Barbiturate      200 Benzodiazepine   121 Tricyclics       975 Opiates          300 Cocaine          300 THC              50   CBC WITH DIFFERENTIAL     Status: Abnormal   Collection Time: 01/16/15 12:08 PM  Result Value Ref Range   WBC 4.7 4.0 - 10.5 K/uL   RBC 4.28 4.22 - 5.81 MIL/uL   Hemoglobin 12.3 (L) 13.0 - 17.0 g/dL   HCT 39.4 39.0 - 52.0 %   MCV 92.1 78.0 - 100.0 fL   MCH 28.7 26.0 - 34.0 pg   MCHC 31.2 30.0 - 36.0 g/dL   RDW 12.9 11.5 - 15.5 %   Platelets 161 150 - 400 K/uL   Neutrophils Relative % 63 43 - 77 %   Neutro Abs 2.9 1.7 - 7.7 K/uL   Lymphocytes Relative 27 12 - 46 %   Lymphs Abs 1.3 0.7 - 4.0 K/uL   Monocytes Relative 8 3 - 12  %   Monocytes Absolute 0.4 0.1 - 1.0 K/uL   Eosinophils Relative 2 0 - 5 %   Eosinophils Absolute 0.1 0.0 - 0.7 K/uL   Basophils Relative  0 0 - 1 %   Basophils Absolute 0.0 0.0 - 0.1 K/uL  Comprehensive metabolic panel     Status: Abnormal   Collection Time: 01/16/15 12:08 PM  Result Value Ref Range   Sodium 142 135 - 145 mmol/L   Potassium 3.8 3.5 - 5.1 mmol/L   Chloride 109 101 - 111 mmol/L   CO2 27 22 - 32 mmol/L   Glucose, Bld 98 65 - 99 mg/dL   BUN 12 6 - 20 mg/dL   Creatinine, Ser 0.97 0.61 - 1.24 mg/dL   Calcium 9.1 8.9 - 10.3 mg/dL   Total Protein 6.6 6.5 - 8.1 g/dL   Albumin 3.7 3.5 - 5.0 g/dL   AST 20 15 - 41 U/L   ALT 10 (L) 17 - 63 U/L   Alkaline Phosphatase 86 38 - 126 U/L   Total Bilirubin 0.8 0.3 - 1.2 mg/dL   GFR calc non Af Amer >60 >60 mL/min   GFR calc Af Amer >60 >60 mL/min    Comment: (NOTE) The eGFR has been calculated using the CKD EPI equation. This calculation has not been validated in all clinical situations. eGFR's persistently <60 mL/min signify possible Chronic Kidney Disease.    Anion gap 6 5 - 15  Ethanol     Status: None   Collection Time: 01/16/15 12:13 PM  Result Value Ref Range   Alcohol, Ethyl (B) <5 <5 mg/dL    Comment:        LOWEST DETECTABLE LIMIT FOR SERUM ALCOHOL IS 5 mg/dL FOR MEDICAL PURPOSES ONLY   Salicylate level     Status: None   Collection Time: 01/16/15 12:13 PM  Result Value Ref Range   Salicylate Lvl <1.6 2.8 - 30.0 mg/dL  Acetaminophen level     Status: Abnormal   Collection Time: 01/16/15 12:13 PM  Result Value Ref Range   Acetaminophen (Tylenol), Serum <10 (L) 10 - 30 ug/mL    Comment:        THERAPEUTIC CONCENTRATIONS VARY SIGNIFICANTLY. A RANGE OF 10-30 ug/mL MAY BE AN EFFECTIVE CONCENTRATION FOR MANY PATIENTS. HOWEVER, SOME ARE BEST TREATED AT CONCENTRATIONS OUTSIDE THIS RANGE. ACETAMINOPHEN CONCENTRATIONS >150 ug/mL AT 4 HOURS AFTER INGESTION AND >50 ug/mL AT 12 HOURS AFTER INGESTION ARE OFTEN  ASSOCIATED WITH TOXIC REACTIONS.   CBG monitoring, ED     Status: Abnormal   Collection Time: 01/16/15  6:11 PM  Result Value Ref Range   Glucose-Capillary 108 (H) 65 - 99 mg/dL   Comment 1 Notify RN   CBG monitoring, ED     Status: Abnormal   Collection Time: 01/16/15  9:08 PM  Result Value Ref Range   Glucose-Capillary 104 (H) 65 - 99 mg/dL  CBG monitoring, ED     Status: Abnormal   Collection Time: 01/17/15  8:26 AM  Result Value Ref Range   Glucose-Capillary 121 (H) 65 - 99 mg/dL   Comment 1 Notify RN   CBG monitoring, ED     Status: Abnormal   Collection Time: 01/17/15 11:50 AM  Result Value Ref Range   Glucose-Capillary 115 (H) 65 - 99 mg/dL   Comment 1 Notify RN     Physical Findings: AIMS: Facial and Oral Movements Muscles of Facial Expression: None, normal Lips and Perioral Area: None, normal Jaw: None, normal Tongue: None, normal,Extremity Movements Upper (arms, wrists, hands, fingers): None, normal Lower (legs, knees, ankles, toes): None, normal, Trunk Movements Neck, shoulders, hips: None, normal, Overall Severity Severity of abnormal movements (highest score  from questions above): None, normal Incapacitation due to abnormal movements: None, normal, Dental Status Current problems with teeth and/or dentures?: Yes (some missing teeth.) Does patient usually wear dentures?: No  CIWA:  CIWA-Ar Total: 0 COWS:  COWS Total Score: 4  Treatment Plan Summary: Plan Start pt back on meds and he reports that Thorazien helped him the best so far for psychosis.a dn adjust the dose and stabilize and discharge pt home   Medical Decision Making:  Review of Medication Regimen & Side Effects (2), Review of New Medication or Change in Dosage (2) and Review or Order of Psychological Test(s) (1)     Epimenio Schetter K 01/18/2015, 11:31 AM

## 2015-01-18 NOTE — Progress Notes (Signed)
Recreation Therapy Notes  INPATIENT RECREATION THERAPY ASSESSMENT  Patient Details Name: [name] MRN: [id number] DOB: 01/12/1961 Today's Date: 01/18/2015  Patient Stressors: Other (Comment) (No transportation-tired of walking everywhere)  Coping Skills:   Substance Abuse, Exercise, Art/Dance, Talking, Music, Sports, Other (Comment) (Get away from stressful area)  Personal Challenges: Anger, Concentration, Substance Abuse, Trusting Others  Leisure Interests (2+):  Art - Draw, Individual - Other (Comment) (Read)  Awareness of Community Resources:  No  Community Resources:     Current Use:    If no, Barriers?:    Patient Strengths:  Confidence, determined  Patient Identified Areas of Improvement:  Concentration, meditation, self-control, dancing  Current Recreation Participation:  Smoking crack, looking at girls  Patient Goal for Hospitalization:  To get back on medication, find a place to stay, manage own money  [place]  [place]   Current [place] (including self-harm):  No  Current HI:  No  Consent to Intern Participation: N/A   [name],[name], LRT/CTRS 01/18/2015, 2:17 PM

## 2015-01-18 NOTE — Progress Notes (Signed)
Recreation Therapy Notes  Date: 07.20.16 Time: 3:00 pm Location: Craft Room  Group Topic: Self-expression  Goal Area(s) Addresses: Patient will be able to identify a color that represents each emotion. Patient will verbalize benefit of using art as a means of self-expression. Patient will verbalize one positive emotion experienced while participating in activity.   Behavioral Response: Attentive  Intervention: The Colors Within Me  Activity: Patients were given a blank face worksheet and instructed to analyze what emotions they are experiencing, pick a color for each emotion, and color how much of that emotion they are experiencing.  Education: LRT educated patients on different forms of self-expression.   Education Outcome: Acknowledges education/In group clarification offered  Clinical Observations/Feedback: Patient spent most of group writing in his group book. Patient did complete the worksheet. Patient did not contribute to group discussion.  Jacquelynn CreeGreene,Hasel Janish M, LRT/CTRS 01/18/2015 4:02 PM

## 2015-01-18 NOTE — [facility] Group Notes (Signed)
[facility] Group Notes:  (Nursing/MHT/Case Management/Adjunct)  Date:  01/18/2015  Time:  1:28 PM  Type of Therapy:  Psychoeducational Skills  Participation Level:  Did Not Attend   Lynelle SmokeCara Travis Hines Va Medical CenterMadoni 01/18/2015, 1:28 PM

## 2015-01-18 NOTE — Plan of Care (Signed)
Problem: Diagnosis: Increased Risk For Suicide Attempt Goal: LTG-Patient Will Show Positive Response to Medication LTG (by discharge) : Patient will show positive response to medication and will participate in the development of the discharge plan.  Outcome: Progressing Patient requested meds for agitation and is showing improved mood.

## 2015-01-18 NOTE — Progress Notes (Signed)
Patient has been irritible at times. He was upset because he was on a heart healthy diet, and didn't get the food he liked. He also was c/o feeling agitated and needed something to get his mood under control. Physician notified of patient's complaints and orders obtained. He has been attending groups and interacting with peers. Rates his depression 7/10 and anxiety 5/10. Denies SI to staff. Will continue to monitor.

## 2015-01-19 MED ORDER — NICOTINE 14 MG/24HR TD PT24
14.0000 mg | MEDICATED_PATCH | Freq: Every day | TRANSDERMAL | Status: DC
  Administered 2015-01-19 – 2015-01-21 (×3): 14 mg via TRANSDERMAL
  Filled 2015-01-19 (×4): qty 1

## 2015-01-19 MED ORDER — CLONIDINE HCL 0.1 MG PO TABS
0.1000 mg | ORAL_TABLET | Freq: Four times a day (QID) | ORAL | Status: DC | PRN
  Administered 2015-01-19 – 2015-01-20 (×3): 0.1 mg via ORAL
  Filled 2015-01-19 (×3): qty 1

## 2015-01-19 MED ORDER — NICOTINE 10 MG IN INHA
1.0000 | RESPIRATORY_TRACT | Status: DC | PRN
  Filled 2015-01-19: qty 36

## 2015-01-19 NOTE — Progress Notes (Deleted)
Recreation Therapy Notes  Date: 07.21.16 Time: 3:00 pm Location: Craft Room  Group Topic: Coping Skills and Leisure Education  Goal Area(s) Addresses:  Patient will identify things they are grateful for. Patient will identify how being grateful can influence decision making.  Behavioral Response: Left early  Intervention: Grateful Wheel  Activity: Patients were given an "I Am Grateful For" worksheet and instructed to listed at least one thing they are grateful for under each category.  Education: LRT educated patients on how being aware of what they are grateful for affects their decision making skills.  Education Outcome: Patient was in group for 1 minute.  Clinical Observations/Feedback: Patient arrived to group at approximately 3:18 pm. LRT provided patient with worksheet and explained activity. Patient did not participate in activity. Patient left group at approximately 3:19 pm.  Jacquelynn Cree, LRT/CTRS 01/19/2015 4:21 PM

## 2015-01-19 NOTE — Plan of Care (Signed)
Problem: [facility] Participation in Recreation Therapeutic Interventions Goal: STG-Patient will identify at least five coping skills for ** STG: Coping Skills - Within 4 treatment sessions, patient will verbalize at least 5 coping skills for substance abuse in each of 2 treatment sessions to decrease substance abuse post d/c.  Outcome: Progressing Treatment Session 1; Completed 1 out of 2: At approximately 11:25 am, LRT met with patient in craft room. Patient verbalized 5 coping skills for substance abuse. LRT educated patient on leisure and why it is important to implement it into his schedule. LRT provided patient with blank schedules to help him plan his day and try to avoid using substances. LRT educated patient on healthy support systems.  Leonette [name], LRT/CTRS 07.21.16 11:52 am

## 2015-01-19 NOTE — Plan of Care (Signed)
Problem: Ineffective individual coping Goal: STG:Pt. will utilize relaxation techniques to reduce stress STG: Patient will utilize relaxation techniques to reduce stress levels  Outcome: Not Progressing Patient anxious and not receptive to instruction on methods to handle stress.

## 2015-01-19 NOTE — Tx Team (Signed)
Interdisciplinary Treatment Plan Update (Adult)  Date:  01/19/2015 Time Reviewed:  4:38 PM  Progress in Treatment: Attending groups: Yes. Participating in groups:  Yes. Taking medication as prescribed:  Yes. Tolerating medication:  Yes. Family/Significant othe contact made:  No, will contact:    Patient understands diagnosis:  Yes. Discussing patient identified problems/goals with staff:  Yes. Medical problems stabilized or resolved:  Yes. Denies suicidal/homicidal ideation: Yes. Issues/concerns per patient self-inventory:  Yes. Other:  New problem(s) identified: Yes, Describe:   PTSD  Discharge Plan or Barriers:  Reason for Continuation of Hospitalization: Delusions  Depression Hallucinations Medication stabilization  Comments: Patient is easily agitated  Estimated length of stay: 5 days  New goal(s):  Review of initial/current patient goals per problem list:   Refer to plan of care  Attendees: Patient:  [name] 7/21/20164:38 PM  Family:   7/21/20164:38 PM  Physician:  Dr  Guss Bunde  Nursing:   Jerry Caras RN  Case Manager:  Arrie Senate LCSW  Counselor:  Charleston Ropes LCSWA  Other:  Princella Ion LRT   7/21/20164:38 PM   7/21/20164:38 PM   7/21/20164:38 PM   7/21/20164:38 PM   7/21/20164:38 PM   7/21/20164:38 PM   7/21/20164:38 PM   7/21/20164:38 PM  Other:  7/21/20164:38 PM  Other:  7/21/20164:38 PM  Other:  7/21/20164:38 PM  Other:  7/21/20164:38 PM  Other:  7/21/20164:38 PM  Other:   7/21/20164:38 PM   Scribe for Treatment Team:   Cheron Schaumann, 01/19/2015, 4:38 PM

## 2015-01-19 NOTE — [facility] Group Notes (Signed)
[facility] Group Notes:  (Nursing/MHT/Case Management/Adjunct)  Date:  01/19/2015  Time:  4:32 PM  Type of Therapy:  Psychoeducational Skills  Participation Level:  Did Not Attend  Taina Landry Lea Campbell 01/19/2015, 4:32 PM 

## 2015-01-19 NOTE — Progress Notes (Signed)
D: Pt is awake in his room this evening. Pt mood is manic and his affect is bright. Pt is hypervigilent and is active drawing in his room, with numerous sheets of paper spread out over his bed, although he is taking medications and is pleasant and cooperative with Clinical research associate. Pt denies SI but endorses AH at times that command him to ahrm self.  A: Writer provided emotional support and administered medications as prescribed.   R: Pt is apprehensive about taking Trazadone because he "hasn't eaten in a year" and it might cause him to overdose. However, did take it and went to bed shortly thereafter. Writer provided additional paper for drawing. Will continue to monitor.

## 2015-01-19 NOTE — Progress Notes (Signed)
[facility] MD Progress Note  01/19/2015 12:02 PM [name]  MRN:  [id number] Subjective:  Pt isa  54 yr old [name] with a long H/O Schizophrenia and been to [facility] and was referred here for Inpt hospitalization " suicidal ideas to cur himself but contracts for safety." Principal Problem: Schizophrenia, paranoid type Diagnosis:   Patient Active Problem List   Diagnosis Date Noted  . Schizophrenia, paranoid type [F20.0] 01/17/2015  . Suicidal ideation [R45.851]   . 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 Chronic paranoid schizophrenia [F20.0] 09/07/2014  . 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 Acute CHF (congestive heart failure) [I50.9] 07/29/2014  . Cocaine use disorder, severe, dependence [F14.20]   . Essential hypertension, benign [I10] 03/28/2013  . Diabetes mellitus [E11.9] 03/15/2013   Total Time spent with patient: 30 minutes.   Past Medical History:  Past Medical History  Diagnosis Date  . Diabetes mellitus without complication   . Hypertension   . Schizophrenia   . CHF (congestive heart failure)   . Neuropathy   . Polysubstance abuse   . Cocaine abuse   . Homelessness   . Hepatitis C   . Chronic foot pain   . Homelessness    History reviewed. No pertinent past surgical history. Family History:  Family History  Problem Relation Age of Onset  . Hypertension Other   . Diabetes Other    Social History:  History  Alcohol Use  . Yes    Comment: Pt denies     History  Drug Use  . 7.00 per week  . Special: "Crack" cocaine, Cocaine, Marijuana    Comment: Cocaine tonight, Marijuana "a long time"    History   Social History  . Marital Status: Divorced    Spouse Name: N/A  . Number of Children: N/A  . Years of Education: N/A   Social History Main Topics  . Smoking status: Current Every Day Smoker -- 1.00 packs/day for 20 years    Types: Cigarettes  . Smokeless  tobacco: Current User  . Alcohol Use: Yes     Comment: Pt denies  . Drug Use: 7.00 per week    Special: "Crack" cocaine, Cocaine, Marijuana     Comment: Cocaine tonight, Marijuana "a long time"  . Sexual Activity: No   Other Topics Concern  . None   Social History Narrative   ** Merged History Encounter **       Additional History:  Pt was seen in his room today. He reports that he still feels paranoid about people around and he does not trust them and that he realized that it will take time for meds to start working and that he does not want his meds to be increased. Pt agrees to sign in for Voluntary Treatment.  Sleep: Fair  Appetite:  Fair   Assessment:   Musculoskeletal: Strength & Muscle Tone: within normal limits Gait & Station: normal Patient leans: N/A   Psychiatric Specialty Exam: Physical Exam  Nursing note and vitals reviewed.   ROS  Blood pressure 171/91, pulse 70, temperature 98 F (36.7 C), temperature source Oral, resp. rate 18, height  (1.753 m), weight 79.379 kg (175 lb), SpO2 100 %.Body mass index is 25.83 kg/(m^2).  General Appearance: Casual  Eye Contact::  Fair  Speech:  Normal Rate  Volume:  Normal  Mood:  Angry, Anxious, Depressed, Dysphoric, Hopeless and helpless  Affect:  Appropriate  Thought Process:  Disorganized  Orientation:  Full (Time, Place, and Person)  Thought Content:  Feels paranoid about people around  Still hearing voices telling him to do things but is able to deal and not follow.  Suicidal Thoughts:  Yes.  without intent/plan  Homicidal Thoughts:  No  Memory:  Negative Immediate;   Fair Recent;   Fair Remote;   Fair adequate  Judgement:  Poor  Insight:  Lacking  Psychomotor Activity:  Normal  Concentration:  Poor  Recall:  [employer] of Knowledge:Fair  Language: Fair  Akathisia:  No  Handed:  Right  AIMS (if indicated):     Assets:  Communication Skills Desire for Improvement Housing Physical Health   ADL's:  Intact  Cognition: WNL  Sleep:  Number of Hours: 5     Current Medications: Current Facility-Administered Medications  Medication Dose Route Frequency Provider Last Rate Last Dose  . acetaminophen (TYLENOL) tablet 650 mg  650 mg Oral Q6H PRN Shari Prows, MD   650 mg at 01/19/15 0841  . alum & mag hydroxide-simeth (MAALOX/MYLANTA) 200-200-20 MG/5ML suspension 30 mL  30 mL Oral Q4H PRN Jolanta B Pucilowska, MD      . chlorproMAZINE (THORAZINE) tablet 50 mg  50 mg Oral BID Beau Fanny, MD   50 mg at 01/19/15 1610  . magnesium hydroxide (MILK OF MAGNESIA) suspension 30 mL  30 mL Oral Daily PRN Jolanta B Pucilowska, MD      . nicotine (NICODERM CQ - dosed in mg/24 hours) patch 14 mg  14 mg Transdermal Q0600 Beau Fanny, MD   14 mg at 01/19/15 9604  . nicotine (NICOTROL) 10 MG inhaler 1 continuous puffing  1 continuous puffing Inhalation PRN Beau Fanny, MD      . traZODone (DESYREL) tablet 100 mg  100 mg Oral QHS PRN Shari Prows, MD   100 mg at 01/18/15 2232    Lab Results:  No results found for this or any previous visit (from the past 48 hour(s)).  Physical Findings: AIMS: Facial and Oral Movements Muscles of Facial Expression: None, normal Lips and Perioral Area: None, normal Jaw: None, normal Tongue: None, normal,Extremity Movements Upper (arms, wrists, hands, fingers): None, normal Lower (legs, knees, ankles, toes): None, normal, Trunk Movements Neck, shoulders, hips: None, normal, Overall Severity Severity of abnormal movements (highest score from questions above): None, normal Incapacitation due to abnormal movements: None, normal, Dental Status Current problems with teeth and/or dentures?: Yes (some missing teeth.) Does patient usually wear dentures?: No  CIWA:  CIWA-Ar Total: 0 COWS:  COWS Total Score: 4  Treatment Plan Summary Continue Thorazine  Current dose of 50 mgs po bid as pt repeatedly stated that this is the best dose that has  helped him so far and refuses any increase of the dose of medicine.   Medical Decision Making:  Review of Medication Regimen & Side Effects (2), Review of New Medication or Change in Dosage (2) and Review or Order of Psychological Test(s) (1)     Jarius Dieudonne K 01/19/2015, 12:02 PM

## 2015-01-19 NOTE — Progress Notes (Signed)
Patient has acted anxious at times today. He requested to sign a 72 hour request for discharge and was upset when told he was IVC'd. Has not attended groups today. Still showing evidence of paranoia. Hypervigilant. Denies SI/HI/AVH. Continue to monitor.

## 2015-01-19 NOTE — [facility] Group Notes (Signed)
[facility] LCSW Group Therapy  01/19/2015 4:37 PM  Type of Therapy:  Group Therapy  Participation Level:  Did Not Attend  Participation Quality:    Affect:    Cognitive:    Insight:    Engagement in Therapy:    Modes of Intervention:    Summary of Progress/Problems:  Cheron Schaumann 01/19/2015, 4:37 PM

## 2015-01-19 NOTE — Progress Notes (Signed)
Recreation Therapy Notes  Date: 07.21.16 Time: 3:00 pm Location: Craft Room  Group Topic: Coping Skills and Leisure Education  Goal Area(s) Addresses:  Patient will identify things they are grateful for. Patient will identify how being grateful can influence decision making.  Behavioral Response: Attentive  Intervention: Lexicographer  Activity: Patients were given an "I Am Grateful For" worksheet and instructed to listed at least one thing they are grateful for under each category.  Education: LRT educated patients on how being aware of what they are grateful for affects their decision making skills.  Education Outcome: In group clarification offered  Clinical Observations/Feedback: Patient completed worksheet. Patient did not contribute to group discussion.  Jacquelynn Cree, LRT/CTRS 01/19/2015 4:20 PM

## 2015-01-19 NOTE — [facility] Counselor (Signed)
Adult Comprehensive Assessment  Patient ID: [name], male DOB: 11-Oct-1960, 54 y.o. MRN: [id number]  Information Source: Information source: Patient  Current Stressors:  Educational / Learning stressors: Pt. denies Employment / Job issues: Pt denies Family Relationships: Pt denies Surveyor, quantity / Lack of resources (include bankruptcy): Pt denies Housing / Lack of housing:Pt is homeless.  Physical health (include injuries & life threatening diseases): Pt denies Social relationships: pt denies Substance abuse: Crack cocaine Bereavement / Loss: Pt denies   Living/Environment/Situation:  Living Arrangements: Homeless  Living conditions (as described by patient or guardian): [place] area, dark at night and pt reports he is scared of dogs. How long has patient lived in current situation?: 25 years  What is atmosphere in current home: Supportive, Dangerous  Family History:  Marital status: Single Does patient have children?: No  Childhood History:  By whom was/is the patient raised?: Other (Comment) ("I raised myself") Description of patient's relationship with caregiver when they were a child: Pt did not want to share Patient's description of current relationship with people who raised him/her: Pt did not want to share; however, reports they are living Does patient have siblings?: No Did patient suffer any verbal/emotional/physical/sexual abuse as a child?: No Did patient suffer from severe childhood neglect?: No Has patient ever been sexually abused/assaulted/raped as an adolescent or adult?: No Was the patient ever a victim of a crime or a disaster?: No Witnessed domestic violence?: No Has patient been effected by domestic violence as an adult?: No  Education:  Highest grade of school patient has completed: 12th Currently a student?: No Learning disability?: No  Employment/Work Situation:  Employment situation: On disability Why is patient on disability:  Mental Health  How long has patient been on disability: Since 1986 Patient's job has been impacted by current illness: No What is the longest time patient has a held a job?: 3 years 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 Has patient ever been in the [employer]?: Yes (Describe in comment) Therapist, occupational (served for 2 years)) Has patient ever served in combat?: Yes 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 [place]  Financial Resources:  Financial resources: [employer], [employer], [employer], Medicare Does patient have a Lawyer or guardian?: No  Alcohol/Substance Abuse:  What has been your use of drugs/alcohol within the last 12 months?: Crack cocaine, PCP, ATC, all kinds of alcohol If attempted suicide, did drugs/alcohol play a role in this?: No Alcohol/Substance Abuse Treatment Hx: Past Tx, Inpatient, Past Tx, Outpatient, Attends AA/NA, Past detox Has alcohol/substance abuse ever caused legal problems?: Yes  Social Support System:  Patient's Community Support System: Poor Describe Community Support System: ACT team, but not neices "It's all about the money for them" Type of faith/religion: "Maybe Muslim"  How does patient's faith help to cope with current illness?: Not sure  Leisure/Recreation:  Leisure and Hobbies: I like to play basketball, swim  Strengths/Needs:  What things does the patient do well?: "Give people good advice, knowledge, wisdom and understanding." In what areas does patient struggle / problems for patient: "I don't struggle anymore"  Discharge Plan:  Does patient have access to transportation?: No Plan for no access to transportation at discharge: Needs bus pass Will patient be returning to same living situation after discharge?: No. Pt wants placement. CSW assessing.  Currently receiving community mental health services: Yes (From Whom) [name]) If no, would patient  like referral for services when discharged?: No Does  patient have financial barriers related to discharge medications?: No  Summary/Recommendations:  Pt is a 54 Yo African American male presented to [facility] with SI and substance abuse. He is currently homeless in [place]. Pt wanted to get through questions quickly and his main concern was his lack of housing. He receives SSDI and Medicaid. He does not have money for housing until the beginning of August. Pt states he wants placement. CSW assessing. He has an ACT team through [facility]. Pt states he has a court date soon but hopes it will be continued. Recommendations include; crisis stabilization, medication management, therapeutic milieu and encourage group attendance and participation.  Rondall Allegra MSW, [place] 01/20/2015

## 2015-01-19 NOTE — [facility] Group Notes (Signed)
[facility] Group Notes:  (Nursing/MHT/Case Management/Adjunct)  Date:  01/19/2015  Time:  1:29 AM  Type of Therapy:  Group Therapy  Participation Level:  Did Not Attend  [name] 01/19/2015, 1:29 AM

## 2015-01-20 ENCOUNTER — Inpatient Hospital Stay: Payer: Self-pay | Admitting: Family Medicine

## 2015-01-20 MED ORDER — HYDROCHLOROTHIAZIDE 25 MG PO TABS
25.0000 mg | ORAL_TABLET | Freq: Every day | ORAL | Status: DC
  Administered 2015-01-20 – 2015-01-22 (×3): 25 mg via ORAL
  Filled 2015-01-20 (×3): qty 1

## 2015-01-20 MED ORDER — CLONIDINE HCL ER 0.1 MG PO TB12
0.1000 mg | ORAL_TABLET | Freq: Every day | ORAL | Status: DC
  Filled 2015-01-20 (×2): qty 1

## 2015-01-20 NOTE — Consult Note (Signed)
[facility] [place] at [facility] Internal medicine consultation   PATIENT NAME: [name]    MR#:  [id number]  DATE OF BIRTH:  04/06/1961  DATE OF ADMISSION:  01/17/2015  PRIMARY CARE PHYSICIAN: No PCP Per Patient   REQUESTING/REFERRING PHYSICIAN: Dr Guss Bunde  CHIEF COMPLAINT:  No chief complaint on file.  Hypertension  HISTORY OF PRESENT ILLNESS:  [name]  is a 54 y.o. male with a known history of hypertension, congestive heart failure, diabetes mellitus cocaine use and paranoid schizophrenia who was admitted to behavioral health on 7/24 hallucinations. He has a history of hypertension and states that he has been on many different antihypertensives in the past, but clonidine is the one that works best for him. He denies headache, chest pain, shortness of breath or syncope. No peripheral edema. His blood pressures have been consistently elevated during this hospitalization at 190/110-170/102. Hospitalist services were asked to consult for management of hypertension.  PAST MEDICAL HISTORY:   Past Medical History  Diagnosis Date  . Diabetes mellitus without complication   . Hypertension   . Schizophrenia   . CHF (congestive heart failure)   . Neuropathy   . Polysubstance abuse   . Cocaine abuse   . Homelessness   . Hepatitis C   . Chronic foot pain   . Homelessness     PAST SURGICAL HISTORY:  History reviewed. No pertinent past surgical history.  SOCIAL HISTORY:   History  Substance Use Topics  . Smoking status: Current Every Day Smoker -- 1.00 packs/day for 20 years    Types: Cigarettes  . Smokeless tobacco: Current User  . Alcohol Use: Yes     Comment: Pt denies    FAMILY HISTORY:   Family History  Problem Relation Age of Onset  . Hypertension Other   . Diabetes Other     DRUG ALLERGIES:   Allergies  Allergen Reactions  . Haldol [Haloperidol] Other (See Comments)    Muscle spasms, loss of voluntary  movement. However, pt has taken Thorazine on multiple occasions with no adverse effects.     REVIEW OF SYSTEMS:   Review of Systems  Constitutional: Negative for fever, chills, weight loss and malaise/fatigue.  HENT: Negative for congestion and hearing loss.   Eyes: Negative for blurred vision and pain.  Respiratory: Negative for cough, hemoptysis, sputum production, shortness of breath and stridor.   Cardiovascular: Negative for chest pain, palpitations, orthopnea and leg swelling.  Gastrointestinal: Negative for nausea, vomiting, abdominal pain, diarrhea, constipation and blood in stool.  Genitourinary: Negative for dysuria and frequency.  Musculoskeletal: Negative for myalgias, back pain, joint pain and neck pain.  Skin: Negative for rash.  Neurological: Negative for focal weakness, loss of consciousness and headaches.  Endo/Heme/Allergies: Does not bruise/bleed easily.  Psychiatric/Behavioral: Positive for depression, hallucinations and substance abuse. The patient is nervous/anxious.     MEDICATIONS AT HOME:   Prior to Admission medications   Not on File      VITAL SIGNS:  Blood pressure 172/102, pulse 76, temperature 98.1 F (36.7 C), temperature source Oral, resp. rate 20, height 5\' 9"  (1.753 m), weight 79.379 kg (175 lb), SpO2 100 %.  PHYSICAL EXAMINATION:  GENERAL:  54 y.o.-year-old patient lying in the bed with no acute distress.  EYES: Pupils equal, round, reactive to light and accommodation. No scleral icterus. Extraocular muscles intact.  HEENT: Head atraumatic, normocephalic. Oropharynx and nasopharynx clear. Mucous membranes are moist NECK:  Supple, no jugular venous distention. No thyroid enlargement,  no tenderness.  LUNGS: Normal breath sounds bilaterally, no wheezing, rales,rhonchi or crepitation. No use of accessory muscles of respiration.  CARDIOVASCULAR: S1, S2 normal. No murmurs, rubs, or gallops.  ABDOMEN: Soft, nontender, nondistended. Bowel sounds  present. No organomegaly or mass.  EXTREMITIES: No pedal edema, cyanosis, or clubbing. Peripheral pulses 2+ NEUROLOGIC: Cranial nerves II through XII are intact. Muscle strength 5/5 in all extremities. Sensation intact. Gait not checked.  PSYCHIATRIC: The patient is alert and oriented x 3. Delusional, tangential, paranoid SKIN: No obvious rash, lesion, or ulcer.   LABORATORY PANEL:   CBC  Recent Labs Lab 01/16/15 1208  WBC 4.7  HGB 12.3*  HCT 39.4  PLT 161   ------------------------------------------------------------------------------------------------------------------  Chemistries   Recent Labs Lab 01/16/15 1208  NA 142  K 3.8  CL 109  CO2 27  GLUCOSE 98  BUN 12  CREATININE 0.97  CALCIUM 9.1  AST 20  ALT 10*  ALKPHOS 86  BILITOT 0.8   ------------------------------------------------------------------------------------------------------------------  Cardiac Enzymes No results for input(s): TROPONINI in the last 168 hours. ------------------------------------------------------------------------------------------------------------------  RADIOLOGY:  No results found.  EKG:   Orders placed or performed during the hospital encounter of 12/29/14  . ED EKG  . ED EKG  . EKG 12-Lead  . EKG 12-Lead  . EKG    IMPRESSION AND PLAN:   #1 Hypertension: We will restart clonidine 0.1 mg long-acting at bedtime. We'll also start hydrochlorothiazide 25 mg daily. Will recheck a BMP to monitor electrolytes and renal function on new medications. He states clearly that once he is discharged he will be homeless and noncompliant with medications. He has requested that I check a lipid panel, which is not unreasonable. We'll also check TSH  #2 possible history of diabetes mellitus Will check hemoglobin A1c  #3 history of congestive heart failure: 2-D echocardiogram from January 2016 shows normal ejection fraction. He does have LVH due to long-standing uncontrolled  hypertension  #3 schizophrenia, paranoia, substance abuse: Continue to follow with psychiatry in behavioral health unit.  CODE STATUS: Full TOTAL TIME TAKING CARE OF THIS PATIENT: 35 minutes.  Greater than 50% of time spent in coordination of care and counseling. Care plan discussed with nursing and with psychiatry  I gave for allowing [place] to participate in the care of this patient. We will continue to follow with you  Elby Showers M.D on 01/20/2015 at 4:25 PM  Between 7am to 6pm - Pager - [phone]  After 6pm go to [email or web address] - password [email or web address] [facility]  [place] Hospitalists  Office  [phone]  CC: Primary care physician; No PCP Per Patient

## 2015-01-20 NOTE — B[facility] Group Notes (Signed)
B[facility] LCSW Group Therapy (Late entry for 01/19/2015)   01/20/2015 3:54 PM  Type of Therapy:  Group Therapy  Participation Level:  Did Not Attend  Modes of Intervention:  Discussion, Education, Socialization and Support  Summary of Progress/Problems: Emotional Regulation: Patients will identify both negative and positive emotions. They will discuss emotions they have difficulty regulating and how they impact their lives. Patients will be asked to identify healthy coping skills to combat unhealthy reactions to negative emotions.     Daisy Floro Domonique Cothran MSW, LCSWA  01/20/2015, 3:54 PM

## 2015-01-20 NOTE — [facility] Group Notes (Signed)
[facility] Group Notes:  (Nursing/MHT/Case Management/Adjunct)  Date:  01/20/2015  Time:  5:58 AM  Type of Therapy:  Group Therapy  Participation Level:  Active  Participation Quality:  Attentive  Affect:  Appropriate  Cognitive:  Appropriate  Insight:  Appropriate  Engagement in Group:  Improving  Modes of Intervention:  n/a  Summary of Progress/Problems:  [name] 01/20/2015, 5:58 AM

## 2015-01-20 NOTE — [facility] Group Notes (Signed)
[facility] Group Notes:  (Nursing/MHT/Case Management/Adjunct)  Date:  01/20/2015  Time:  11:37 AM  Type of Therapy:  Group Therapy  Participation Level:  Did Not Attend  Summary of Progress/Problems:  Lailie Smead De'Chelle Marquail Bradwell 01/20/2015, 11:37 AM

## 2015-01-20 NOTE — Plan of Care (Signed)
Problem: [facility] Participation in Recreation Therapeutic Interventions Goal: STG-Patient will identify at least five coping skills for ** STG: Coping Skills - Within 4 treatment sessions, patient will verbalize at least 5 coping skills for substance abuse in each of 2 treatment sessions to decrease substance abuse post d/c.  Outcome: Completed/Met Date Met:  01/20/15 Treatment Session 2; Completed 2 out of 2: At approximately 12:30 pm, LRT met with patient in patient room. Patient verbalized at least 5 coping skills for substance abuse. LRT encouraged patient to participate in more leisure activities.  Leonette [name], LRT/CTRS 07.22.16 1:30 pm

## 2015-01-20 NOTE — Progress Notes (Signed)
Recreation Therapy Notes  Date: 07.22.16 Time: 3:00 pm Location: Craft Room  Group Topic: [place]  Goal Area(s) Addresses:  Patients will learn a healthy coping skill. Patients will verbalized benefit of coloring.  Behavioral Response: Attentive, Left early  Intervention: [place]  Activity: Patients colored a mandala to learn a new coping skill.  Education: LRT educated patients on the benefits of coloring.   Education Outcome: Acknowledges education/In group clarification offered  Clinical Observations/Feedback: Patient colored mandala. Patient left group at approximately 3:27 pm. Patient did not return to group.  Jacquelynn Cree, LRT/CTRS 01/20/2015 4:22 PM

## 2015-01-20 NOTE — Progress Notes (Signed)
[facility] MD Progress Note  01/20/2015 10:43 AM [name]  MRN:  [id number] Subjective:  Pt isa  54 yr old [name] with a long H/O Schizophrenia and been to [facility] and was referred here for Inpt hospitalization " suicidal ideas to cut himself but contracts for safety." pt was seen in his room and reports " feeling much better." Pt had HTN and is being followed by hospitalist for the same. Principal Problem: Schizophrenia, paranoid type Diagnosis:   Patient Active Problem List   Diagnosis Date Noted  . Schizophrenia, paranoid type [F20.0] 01/17/2015  . Suicidal ideation [R45.851]   . 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 Chronic paranoid schizophrenia [F20.0] 09/07/2014  . 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 Acute CHF (congestive heart failure) [I50.9] 07/29/2014  . Cocaine use disorder, severe, dependence [F14.20]   . Essential hypertension, benign [I10] 03/28/2013  . Diabetes mellitus [E11.9] 03/15/2013   Total Time spent with patient: 30 minutes.   Past Medical History:  Past Medical History  Diagnosis Date  . Diabetes mellitus without complication   . Hypertension   . Schizophrenia   . CHF (congestive heart failure)   . Neuropathy   . Polysubstance abuse   . Cocaine abuse   . Homelessness   . Hepatitis C   . Chronic foot pain   . Homelessness    History reviewed. No pertinent past surgical history. Family History:  Family History  Problem Relation Age of Onset  . Hypertension Other   . Diabetes Other    Social History:  History  Alcohol Use  . Yes    Comment: Pt denies     History  Drug Use  . 7.00 per week  . Special: "Crack" cocaine, Cocaine, Marijuana    Comment: Cocaine tonight, Marijuana "a long time"    History   Social History  . Marital Status: Divorced    Spouse Name: N/A  . Number of Children: N/A  . Years of Education: N/A   Social History  Main Topics  . Smoking status: Current Every Day Smoker -- 1.00 packs/day for 20 years    Types: Cigarettes  . Smokeless tobacco: Current User  . Alcohol Use: Yes     Comment: Pt denies  . Drug Use: 7.00 per week    Special: "Crack" cocaine, Cocaine, Marijuana     Comment: Cocaine tonight, Marijuana "a long time"  . Sexual Activity: No   Other Topics Concern  . None   Social History Narrative   ** Merged History Encounter **       Additional History:  Pt was seen in his room today. He reports that he  feels  Better about himself as meds are working and does not feel as paranoid about people around and he does feel better than he was at the time of admission though sometimes he feels he has problems trusting people around. Pt agrees to sign in for Voluntary Treatment.  Sleep: Fair  Appetite:  Fair   Assessment:   Musculoskeletal: Strength & Muscle Tone: within normal limits Gait & Station: normal Patient leans: N/A   Psychiatric Specialty Exam: Physical Exam  Nursing note and vitals reviewed.   ROS  Blood pressure 172/102, pulse 76, temperature 98.1 F (36.7 C), temperature source Oral, resp. rate 20, height  (1.753 m), weight 79.379 kg (175 lb), SpO2 100 %.Body mass index is 25.83 kg/(m^2).  General Appearance: Casual  Eye  Contact::  Fair  Speech:  Normal Rate  Volume:  Normal  Mood:  Angry, Anxious, Depressed, Dysphoric, Hopeless and helpless  Affect:  Appropriate  Thought Process:  More organized and not as paranoid as he was at time of admission.  Orientation:  Full (Time, Place, and Person)  Thought Content:  Feels paranoid about people around  Still hearing voices but they are much less intense and not as bad as at the time of admission.  Suicidal Thoughts:  Yes.  without intent/plan  Homicidal Thoughts:  No  Memory:  Negative Immediate;   Fair Recent;   Fair Remote;   Fair adequate  Judgement:  Poor  Insight:  Lacking  Psychomotor Activity:  Normal   Concentration:  Poor  Recall:  [employer] of Knowledge:Fair  Language: Fair  Akathisia:  No  Handed:  Right  AIMS (if indicated):     Assets:  Communication Skills Desire for Improvement Housing Physical Health  ADL's:  Intact  Cognition: WNL  Sleep:  Number of Hours: 2.25     Current Medications: Current Facility-Administered Medications  Medication Dose Route Frequency Provider Last Rate Last Dose  . acetaminophen (TYLENOL) tablet 650 mg  650 mg Oral Q6H PRN Shari Prows, MD   650 mg at 01/19/15 1559  . alum & mag hydroxide-simeth (MAALOX/MYLANTA) 200-200-20 MG/5ML suspension 30 mL  30 mL Oral Q4H PRN Jolanta B Pucilowska, MD   30 mL at 01/19/15 1559  . chlorproMAZINE (THORAZINE) tablet 50 mg  50 mg Oral BID Beau Fanny, MD   50 mg at 01/20/15 0847  . cloNIDine (CATAPRES) tablet 0.1 mg  0.1 mg Oral Q6H PRN Brandy Hale, MD   0.1 mg at 01/20/15 0655  . hydrochlorothiazide (HYDRODIURIL) tablet 25 mg  25 mg Oral Daily Gale Journey, MD   25 mg at 01/20/15 1038  . magnesium hydroxide (MILK OF MAGNESIA) suspension 30 mL  30 mL Oral Daily PRN Jolanta B Pucilowska, MD      . nicotine (NICODERM CQ - dosed in mg/24 hours) patch 14 mg  14 mg Transdermal Q0600 Beau Fanny, MD   14 mg at 01/20/15 0846  . nicotine (NICOTROL) 10 MG inhaler 1 continuous puffing  1 continuous puffing Inhalation PRN Beau Fanny, MD      . traZODone (DESYREL) tablet 100 mg  100 mg Oral QHS PRN Shari Prows, MD   100 mg at 01/19/15 2354    Lab Results:  No results found for this or any previous visit (from the past 48 hour(s)).  Physical Findings: AIMS: Facial and Oral Movements Muscles of Facial Expression: None, normal Lips and Perioral Area: None, normal Jaw: None, normal Tongue: None, normal,Extremity Movements Upper (arms, wrists, hands, fingers): None, normal Lower (legs, knees, ankles, toes): None, normal, Trunk Movements Neck, shoulders, hips: None, normal, Overall  Severity Severity of abnormal movements (highest score from questions above): None, normal Incapacitation due to abnormal movements: None, normal, Dental Status Current problems with teeth and/or dentures?: Yes (some missing teeth.) Does patient usually wear dentures?: No  CIWA:  CIWA-Ar Total: 0 COWS:  COWS Total Score: 4  Treatment Plan Summary Continue Thorazine  Current dose of 50 mgs po bid as pt repeatedly stated that this is the best dose that has helped him so far and refuses any increase of the dose of medicine. Being followed for hospitalist for HTN .   Medical Decision Making:  Continue current meds and consider D/C in the  next few days if pt continue s to improve and HTN is controlled.     Beau Fanny 01/20/2015, 10:43 AM

## 2015-01-20 NOTE — BHH Group Notes (Signed)
[facility] LCSW Aftercare Discharge Planning Group Note   01/20/2015 3:55 PM  Participation Quality:  Active   Mood/Affect:  Flat  Depression Rating:  2  Anxiety Rating:  5  Thoughts of Suicide:  No Will you contract for safety?   NA  Current AVH:  No  Plan for Discharge/Comments:  Pt is concern where he is going to stay upon discharge. He states he does not have any money until next month. He states he wants an "old folks home."   Transportation Means: Bus   Supports: None   Kendra Grissett [employer] MSW, [employer]

## 2015-01-20 NOTE — Plan of Care (Signed)
Problem: Alteration in thought process Goal: LTG-Patient verbalizes understanding importance med regimen (Patient verbalizes understanding of importance of medication regimen and need to continue outpatient care.) Outcome: Progressing Pt acknowledges the importance of taking his psychotropic medications in order to manage his paranoia. Goal: STG-Patient is able to discuss thoughts with staff Outcome: Progressing Pt discussed insights into his own mental illness, and realizes that his thought processes are abnormal. Goal: STG-Patient does not respond to command hallucinations Outcome: Progressing Pt states that his agitation originates from his own paranoia and misperception of the environment, and adjusted his own behavior.

## 2015-01-20 NOTE — Progress Notes (Signed)
D: Pt is awake and active in the milieu this evening. However, he did become agitated and paranoid that the other patiets were "walking in front of me on purpose." Pt stated they need to "stay the hell away from me." Pt was agitated responding to [facility] and went back to his room in order to avoid conflict with peers. Pt is also has uncontrolled hypertension.  A: Writer provided emotional support and performed manual B/P, which was out of normal range. On call M.D. Ordered PRN medication, but it did not resolve or improve his HTN. Pt is also refusing additional interventions and stated he would not any more medication because it "stays high no matter what I take." Pt denies HA or dizziness and is asymptomatic. M.D. then ordered an urgent  IP hospitalist consult.   R: Pt requested Trazadone for sleep and acknowledged that he is "very paranoid" and that no one was to blame for how he felt earlier this evening. Pt became calm and pleasant and stated that he needs to be in a group home or structured environment because he cannot survive on the streets. Pt seems to have good insight and claims that he has learned through experience that many of his thought processes are not based in reality, and that he needs assistance. Pt promises to continue taking his "mental meds" consistently after discharge. Pt returned to bed thereafter.

## 2015-01-20 NOTE — Progress Notes (Signed)
Patient is awake and alert.  He has been medication compliant.  Affect remains blunted and patient appears suspicious at times.  He has had several requests but has been polite and remained  in his room most of the day.

## 2015-01-20 NOTE — [facility] Group Notes (Signed)
[facility] LCSW Group Therapy  01/20/2015 3:34 PM  Type of Therapy:  Group Therapy  Participation Level:  None Left early  Participation Quality:  N/A  Affect:  Anxious and Irritable  Cognitive:  uable to assess\  Insight:  unable to assess  Engagement in Therapy:  left early   Modes of Intervention:  Support  Summary of Progress/Problems: Patient came to group but was irritable and left early after 5 minutes of group. Patient was able to participate in an introductory exercise before becoming frustrated with another patient in group and introduced himself and shared that his dream vacacation spot would be "[place]...women and igloos".  Lulu Riding, MSW, LCSWA 01/20/2015, 3:34 PM

## 2015-01-21 DIAGNOSIS — F200 Paranoid schizophrenia: Principal | ICD-10-CM

## 2015-01-21 LAB — BASIC METABOLIC PANEL
Anion gap: 7 (ref 5–15)
BUN: 13 mg/dL (ref 6–20)
CHLORIDE: 103 mmol/L (ref 101–111)
CO2: 29 mmol/L (ref 22–32)
Calcium: 9.5 mg/dL (ref 8.9–10.3)
Creatinine, Ser: 0.92 mg/dL (ref 0.61–1.24)
GFR calc Af Amer: >60 mL/min (ref >60)
GFR calc non Af Amer: >60 mL/min (ref >60)
GLUCOSE: 133 mg/dL — AB (ref 65–99)
POTASSIUM: 4 mmol/L (ref 3.5–5.1)
Sodium: 139 mmol/L (ref 135–145)

## 2015-01-21 LAB — LIPID PANEL
CHOL/HDL RATIO: 2.7 ratio
Cholesterol: 165 mg/dL (ref 0–200)
HDL: 61 mg/dL (ref >40)
LDL Cholesterol: 91 mg/dL (ref 0–99)
Triglycerides: 64 mg/dL (ref <150)
VLDL: 13 mg/dL (ref 0–40)

## 2015-01-21 LAB — TSH: TSH: 1.798 u[IU]/mL (ref 0.350–4.500)

## 2015-01-21 LAB — HEMOGLOBIN A1C: HEMOGLOBIN A1C: 5.7 % (ref 4.0–6.0)

## 2015-01-21 MED ORDER — CHLORPROMAZINE HCL 25 MG PO TABS
100.0000 mg | ORAL_TABLET | Freq: Two times a day (BID) | ORAL | Status: DC
  Administered 2015-01-21 – 2015-01-23 (×4): 100 mg via ORAL
  Filled 2015-01-21 (×4): qty 4

## 2015-01-21 MED ORDER — CLONIDINE HCL 0.1 MG PO TABS
0.1000 mg | ORAL_TABLET | Freq: Two times a day (BID) | ORAL | Status: DC
  Administered 2015-01-21 – 2015-01-23 (×4): 0.1 mg via ORAL
  Filled 2015-01-21 (×4): qty 1

## 2015-01-21 NOTE — [facility] Group Notes (Signed)
[facility] Group Notes:  (Nursing/MHT/Case Management/Adjunct)  Date:  01/21/2015  Time:  3:08 AM  Type of Therapy:  Group Therapy  Participation Level:  Active  Participation Quality:  Appropriate  Affect:  Appropriate  Cognitive:  Appropriate  Insight:  Appropriate  Engagement in Group:  Engaged  Modes of Intervention:  n/a  Summary of Progress/Problems:  Veva Holes 01/21/2015, 3:08 AM

## 2015-01-21 NOTE — Progress Notes (Signed)
[facility] - [place] at [facility]   PATIENT NAME: [name]    MR#:  [id number]  DATE OF BIRTH:  June 06, 1961  SUBJECTIVE:  Doing ok. Tells me his BP always has been high. Does not take meds out of the hospital. admitts to doing cocaine  REVIEW OF SYSTEMS:   Review of Systems  Constitutional: Negative for fever, chills and weight loss.  HENT: Negative for ear discharge, ear pain and nosebleeds.   Eyes: Negative for blurred vision, pain and discharge.  Respiratory: Negative for sputum production, shortness of breath, wheezing and stridor.   Cardiovascular: Negative for chest pain, palpitations, orthopnea and PND.  Gastrointestinal: Negative for nausea, vomiting, abdominal pain and diarrhea.  Genitourinary: Negative for urgency and frequency.  Musculoskeletal: Negative for back pain and joint pain.  Neurological: Negative for sensory change, speech change, focal weakness and weakness.  Psychiatric/Behavioral: Negative for depression. The patient is not nervous/anxious.   All other systems reviewed and are negative.  Tolerating Diet:yes  DRUG ALLERGIES:   Allergies  Allergen Reactions  . Haldol [Haloperidol] Other (See Comments)    Muscle spasms, loss of voluntary movement. However, pt has taken Thorazine on multiple occasions with no adverse effects.     VITALS:  Blood pressure 160/99, pulse 77, temperature 98.2 F (36.8 C), temperature source Oral, resp. rate 20, height  (1.753 m), weight 79.379 kg (175 lb), SpO2 100 %.  PHYSICAL EXAMINATION:   Physical Exam  GENERAL:  54 y.o.-year-old patient lying in the bed with no acute distress.  EYES: Pupils equal, round, reactive to light and accommodation. No scleral icterus. Extraocular muscles intact.  HEENT: Head atraumatic, normocephalic. Oropharynx and nasopharynx clear.  NECK:  Supple, no jugular venous distention. No thyroid enlargement, no tenderness.  LUNGS: Normal breath sounds  bilaterally, no wheezing, rales, rhonchi. No use of accessory muscles of respiration.  CARDIOVASCULAR: S1, S2 normal. No murmurs, rubs, or gallops.  ABDOMEN: Soft, nontender, nondistended. Bowel sounds present. No organomegaly or mass.  EXTREMITIES: No cyanosis, clubbing or edema b/l.    NEUROLOGIC: Cranial nerves II through XII are intact. No focal Motor or sensory deficits b/l.   PSYCHIATRIC: The patient is alert and oriented x 3.  SKIN: No obvious rash, lesion, or ulcer.   LABORATORY PANEL:   CBC  Recent Labs Lab 01/16/15 1208  WBC 4.7  HGB 12.3*  HCT 39.4  PLT 161    Chemistries   Recent Labs Lab 01/16/15 1208 01/21/15 0713  NA 142 139  K 3.8 4.0  CL 109 103  CO2 27 29  GLUCOSE 98 133*  BUN 12 13  CREATININE 0.97 0.92  CALCIUM 9.1 9.5  AST 20  --   ALT 10*  --   ALKPHOS 86  --   BILITOT 0.8  --     ASSESSMENT AND PLAN:  54 y.o. male with a known history of hypertension, congestive heart failure, diabetes mellitus cocaine use and paranoid schizophrenia who was admitted to behavioral health on 7/24 hallucinations. He has a history of hypertension and states that he has been on many different antihypertensives in the past, but clonidine is the one that works best for him.  #1 Hypertension: - clonidine 0.1 mg bid -hydrochlorothiazide 25 mg daily.   #2 possible history of diabetes mellitus -hga1c 5.4   #3 history of congestive heart failure: 2-D echocardiogram from January 2016 shows normal ejection fraction. He does have LVH due to long-standing uncontrolled hypertension  #4Chronic schizophrenia, paranoia, substance  abuse: -Continue to follow with psychiatry in behavioral health unit.  Will sign off and follow from distance  Case discussed with Care Management/Social Worker. Management plans discussed with the patient, family and they are in agreement.  CODE STATUS: full  DVT Prophylaxis: ambulatory  TOTAL TIME TAKING CARE OF THIS PATIENT: 40 minutes.   >50% time spent on counselling and coordination of care  POSSIBLE D/C IN 1-2 DAYS, DEPENDING ON CLINICAL CONDITION.   Valerian Jewel M.D on 01/21/2015 at 2:42 PM  Between 7am to 6pm - Pager - [phone]  After 6pm go to [email or web address] - password [email or web address] [facility]  [facility]  Office  [phone]  CC: Primary care physician; No PCP Per Patient

## 2015-01-21 NOTE — [facility] Group Notes (Signed)
[facility] LCSW Group Therapy  01/21/2015 2:15 PM  Type of Therapy:  Group Therapy  Participation Level:  Did Not Attend  Modes of Intervention:  Discussion, Education, Socialization and Support  Summary of Progress/Problems: Communications: Patients identify how individuals communicate with one another appropriately and inappropriately. Patients will be guided to discuss their thoughts, feelings, and behaviors related to barriers when communicating. The group will process together ways to execute positive and appropriate communications   [employer] MSW, LCSWA  01/21/2015, 2:15 PM

## 2015-01-21 NOTE — Progress Notes (Signed)
Pt has been some what  pleasant and cooperative although some what seclusive to his room. Pt has attended some unit activities. Pt's mood and affect has been depressed. Pt denies SI and is able to contract for  Safety.

## 2015-01-21 NOTE — Progress Notes (Signed)
Pt states and understanding of his paranoia. States his medications are working for him and he is relieved. Interacts with peers and is supportive / pleasant. States a sense of urgency about getting his medication. Blood pressure continues to be monitored and medications given as needed to maintain a healthy reading. Denies current SI and HI. No new needs or distress.

## 2015-01-21 NOTE — Progress Notes (Signed)
[facility] MD Progress Note  01/21/2015 2:56 PM [name]  MRN:  [id number] Subjective: Patient with schizophrenia being stabilized in the hospital. On interview today the patient continues to state that he knows that he has a severe chemical imbalance. He says his thoughts are not clear. He talks about a lot of paranoid thoughts. At times talks about how he wants to kill people and kill himself. Rambles on in a very disorganized manner. Insight appears to be only partial. His loud pressure is still running high and his medical problems are not completely under control. Patient states that the only anti-psychotics he can take his Thorazine Principal Problem: Schizophrenia, paranoid type Diagnosis:   Patient Active Problem List   Diagnosis Date Noted  . Schizophrenia, paranoid type [F20.0] 01/17/2015  . Suicidal ideation [R45.851]   . 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 Chronic paranoid schizophrenia [F20.0] 09/07/2014  . 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 Acute CHF (congestive heart failure) [I50.9] 07/29/2014  . Cocaine use disorder, severe, dependence [F14.20]   . Essential hypertension, benign [I10] 03/28/2013  . Diabetes mellitus [E11.9] 03/15/2013   Total Time spent with patient: 30 minutes.   Past Medical History:  Past Medical History  Diagnosis Date  . Diabetes mellitus without complication   . Hypertension   . Schizophrenia   . CHF (congestive heart failure)   . Neuropathy   . Polysubstance abuse   . Cocaine abuse   . Homelessness   . Hepatitis C   . Chronic foot pain   . Homelessness    History reviewed. No pertinent past surgical history. Family History:  Family History  Problem Relation Age of Onset  . Hypertension Other   . Diabetes Other    Social History:  History  Alcohol Use  . Yes    Comment: Pt denies     History  Drug Use  . 7.00 per week  . Special: "Crack"  cocaine, Cocaine, Marijuana    Comment: Cocaine tonight, Marijuana "a long time"    History   Social History  . Marital Status: Divorced    Spouse Name: N/A  . Number of Children: N/A  . Years of Education: N/A   Social History Main Topics  . Smoking status: Current Every Day Smoker -- 1.00 packs/day for 20 years    Types: Cigarettes  . Smokeless tobacco: Current User  . Alcohol Use: Yes     Comment: Pt denies  . Drug Use: 7.00 per week    Special: "Crack" cocaine, Cocaine, Marijuana     Comment: Cocaine tonight, Marijuana "a long time"  . Sexual Activity: No   Other Topics Concern  . None   Social History Narrative   ** Merged History Encounter **       Additional History:  Pt was seen in his room today. He reports that he  feels  Better about himself as meds are working and does not feel as paranoid about people around and he does feel better than he was at the time of admission though sometimes he feels he has problems trusting people around. Pt agrees to sign in for Voluntary Treatment.  Sleep: Fair  Appetite:  Fair   Assessment:   Musculoskeletal: Strength & Muscle Tone: within normal limits Gait & Station: normal Patient leans: N/A   Psychiatric Specialty Exam: Physical Exam  Nursing note and vitals reviewed. Constitutional: He appears well-developed and well-nourished.  HENT:  Head: Normocephalic  and atraumatic.  Eyes: Conjunctivae are normal. Pupils are equal, round, and reactive to light.  Neck: Normal range of motion.  Cardiovascular: Normal heart sounds.   Respiratory: Effort normal.  GI: Soft.  Musculoskeletal: Normal range of motion.  Neurological: He is alert.  Skin: Skin is warm and dry.  Psychiatric: His mood appears anxious. His affect is angry. His speech is rapid and/or pressured. He is agitated. Thought content is paranoid and delusional. Cognition and memory are impaired. He expresses impulsivity. He expresses homicidal ideation.   Patient was in bed and appeared to be asleep when I first came to see him. He was very labile in his presentation. At times he was friendly and laughing and at other points would get very angry and talk about how he wanted to kill people. Became more evident as we talked how psychotic and confused he was.    Review of Systems  Constitutional: Negative.   HENT: Negative.   Eyes: Negative.   Respiratory: Negative.   Cardiovascular: Negative.   Gastrointestinal: Negative.   Musculoskeletal: Negative.   Skin: Negative.   Neurological: Negative.   Psychiatric/Behavioral: Positive for depression, hallucinations and substance abuse. Negative for suicidal ideas. The patient is nervous/anxious and has insomnia.     Blood pressure 160/99, pulse 77, temperature 98.2 F (36.8 C), temperature source Oral, resp. rate 20, height _0  (1.753 m), weight 79.379 kg (175 lb), SpO2 100 %.Body mass index is 25.83 kg/(m^2).  General Appearance: Casual  Eye Contact::  Fair  Speech:  Normal Rate  Volume:  Normal  Mood:  Angry, Anxious, Depressed, Dysphoric, Hopeless and helpless  Affect:  Appropriate  Thought Process:  More organized and not as paranoid as he was at time of admission.  Orientation:  Full (Time, Place, and Person)  Thought Content:  Feels paranoid about people around  Still hearing voices but they are much less intense and not as bad as at the time of admission.  Suicidal Thoughts:  Yes.  without intent/plan  Homicidal Thoughts:  No  Memory:  Negative Immediate;   Fair Recent;   Fair Remote;   Fair adequate  Judgement:  Poor  Insight:  Lacking  Psychomotor Activity:  Normal  Concentration:  Poor  Recall:  [employer] of Knowledge:Fair  Language: Fair  Akathisia:  No  Handed:  Right  AIMS (if indicated):     Assets:  Communication Skills Desire for Improvement Housing Physical Health  ADL's:  Intact  Cognition: WNL  Sleep:  Number of Hours: 0.5     Current  Medications: Current Facility-Administered Medications  Medication Dose Route Frequency Provider Last Rate Last Dose  . acetaminophen (TYLENOL) tablet 650 mg  650 mg Oral Q6H PRN Clovis Fredrickson, MD   650 mg at 01/20/15 2139  . alum & mag hydroxide-simeth (MAALOX/MYLANTA) 200-200-20 MG/5ML suspension 30 mL  30 mL Oral Q4H PRN Jolanta B Pucilowska, MD   30 mL at 01/19/15 1559  . chlorproMAZINE (THORAZINE) tablet 100 mg  100 mg Oral BID Gonzella Lex, MD      . cloNIDine (CATAPRES) tablet 0.1 mg  0.1 mg Oral Q6H PRN Rainey Pines, MD   0.1 mg at 01/20/15 2139  . cloNIDine HCl (KAPVAY) ER tablet 0.1 mg  0.1 mg Oral BID Fritzi Mandes, MD      . hydrochlorothiazide (HYDRODIURIL) tablet 25 mg  25 mg Oral Daily Aldean Jewett, MD   25 mg at 01/21/15 0939  . magnesium hydroxide (MILK OF  MAGNESIA) suspension 30 mL  30 mL Oral Daily PRN Jolanta B Pucilowska, MD      . nicotine (NICODERM CQ - dosed in mg/24 hours) patch 14 mg  14 mg Transdermal Q0600 Dewain Penning, MD   14 mg at 01/21/15 1139  . nicotine (NICOTROL) 10 MG inhaler 1 continuous puffing  1 continuous puffing Inhalation PRN Dewain Penning, MD      . traZODone (DESYREL) tablet 100 mg  100 mg Oral QHS PRN Clovis Fredrickson, MD   100 mg at 01/19/15 2354    Lab Results:  Results for orders placed or performed during the hospital encounter of 01/17/15 (from the past 48 hour(s))  Basic metabolic panel     Status: Abnormal   Collection Time: 01/21/15  7:13 AM  Result Value Ref Range   Sodium 139 135 - 145 mmol/L   Potassium 4.0 3.5 - 5.1 mmol/L   Chloride 103 101 - 111 mmol/L   CO2 29 22 - 32 mmol/L   Glucose, Bld 133 (H) 65 - 99 mg/dL   BUN 13 6 - 20 mg/dL   Creatinine, Ser 0.92 0.61 - 1.24 mg/dL   Calcium 9.5 8.9 - 10.3 mg/dL   GFR calc non Af Amer >60 >60 mL/min   GFR calc Af Amer >60 >60 mL/min    Comment: (NOTE) The eGFR has been calculated using the CKD EPI equation. This calculation has not been validated in all clinical  situations. eGFR's persistently <60 mL/min signify possible Chronic Kidney Disease.    Anion gap 7 5 - 15  Hemoglobin A1c     Status: None   Collection Time: 01/21/15  7:13 AM  Result Value Ref Range   Hgb A1c MFr Bld 5.7 4.0 - 6.0 %  TSH     Status: None   Collection Time: 01/21/15  7:13 AM  Result Value Ref Range   TSH 1.798 0.350 - 4.500 uIU/mL  Lipid panel     Status: None   Collection Time: 01/21/15  7:13 AM  Result Value Ref Range   Cholesterol 165 0 - 200 mg/dL   Triglycerides 64 <150 mg/dL   HDL 61 >40 mg/dL   Total CHOL/HDL Ratio 2.7 RATIO   VLDL 13 0 - 40 mg/dL   LDL Cholesterol 91 0 - 99 mg/dL    Comment:        Total Cholesterol/HDL:CHD Risk Coronary Heart Disease Risk Table                     Men   Women  1/2 Average Risk   3.4   3.3  Average Risk       5.0   4.4  2 X Average Risk   9.6   7.1  3 X Average Risk  23.4   11.0        Use the calculated Patient Ratio above and the CHD Risk Table to determine the patient's CHD Risk.        ATP III CLASSIFICATION (LDL):  <100     mg/dL   Optimal  100-129  mg/dL   Near or Above                    Optimal  130-159  mg/dL   Borderline  160-189  mg/dL   High  >190     mg/dL   Very High     Physical Findings: AIMS: Facial and Oral Movements Muscles of Facial Expression:  None, normal Lips and Perioral Area: None, normal Jaw: None, normal Tongue: None, normal,Extremity Movements Upper (arms, wrists, hands, fingers): None, normal Lower (legs, knees, ankles, toes): None, normal, Trunk Movements Neck, shoulders, hips: None, normal, Overall Severity Severity of abnormal movements (highest score from questions above): None, normal Incapacitation due to abnormal movements: None, normal, Dental Status Current problems with teeth and/or dentures?: Yes (some missing teeth.) Does patient usually wear dentures?: No  CIWA:  CIWA-Ar Total: 0 COWS:  COWS Total Score: 4  Treatment Plan Summary patient still quite  psychotic and agitated. I have increased his dose of Thorazine to 100 mg twice a day which is getting into a more therapeutic potentially anti-psychotic range. Supportive counseling done. Continue monitoring blood sugar and blood pressure and other medical illnesses. No other change to treatment today. .   Medical Decision Making:  Continue current meds and consider D/C in the next few days if pt continue s to improve and HTN is controlled.     John Clapacs 01/21/2015, 2:56 PM

## 2015-01-22 MED ORDER — HYDROCHLOROTHIAZIDE 50 MG PO TABS
50.0000 mg | ORAL_TABLET | Freq: Every day | ORAL | Status: DC
  Administered 2015-01-23: 50 mg via ORAL
  Filled 2015-01-22: qty 1

## 2015-01-22 NOTE — Plan of Care (Signed)
Problem: Diagnosis: Increased Risk For Suicide Attempt Goal: LTG-Patient Will Report Absence of Withdrawal Symptoms LTG (by discharge): Patient will report absence of withdrawal symptoms.  Outcome: Progressing No withdrawal symptoms noted

## 2015-01-22 NOTE — Progress Notes (Signed)
Patient is seen in the milieu. Pleasant and cooperative and denying suicidal/homicidal thoughts. Denying hallucinations. Thought process somehow organized. Frequently presenting to the nurses station requesting services. Requested his medications as scheduled and has no major concern so far. Safety precautions maintained.

## 2015-01-22 NOTE — Progress Notes (Signed)
[facility] MD Progress Note  01/22/2015 11:43 PM [name]  MRN:  [id number] Subjective: Patient interviewed. Chart reviewed. Case discussed with nursing. Patient's behavior is a little better today. He had to move him to a new room because he was being intrusive with male patients. Patient doesn't complain. Blood pressure still running high. He appears to of complied with the increased dose of Thorazine Principal Problem: Schizophrenia, paranoid type Diagnosis:   Patient Active Problem List   Diagnosis Date Noted  . Schizophrenia, paranoid type [F20.0] 01/17/2015  . Suicidal ideation [R45.851]   . 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 Chronic paranoid schizophrenia [F20.0] 09/07/2014  . 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 Acute CHF (congestive heart failure) [I50.9] 07/29/2014  . Cocaine use disorder, severe, dependence [F14.20]   . Essential hypertension, benign [I10] 03/28/2013  . Diabetes mellitus [E11.9] 03/15/2013   Total Time spent with patient: 30 minutes.   Past Medical History:  Past Medical History  Diagnosis Date  . Diabetes mellitus without complication   . Hypertension   . Schizophrenia   . CHF (congestive heart failure)   . Neuropathy   . Polysubstance abuse   . Cocaine abuse   . Homelessness   . Hepatitis C   . Chronic foot pain   . Homelessness    History reviewed. No pertinent past surgical history. Family History:  Family History  Problem Relation Age of Onset  . Hypertension Other   . Diabetes Other    Social History:  History  Alcohol Use  . Yes    Comment: Pt denies     History  Drug Use  . 7.00 per week  . Special: "Crack" cocaine, Cocaine, Marijuana    Comment: Cocaine tonight, Marijuana "a long time"    History   Social History  . Marital Status: Divorced    Spouse Name: N/A  . Number of Children: N/A  . Years of Education: N/A   Social  History Main Topics  . Smoking status: Current Every Day Smoker -- 1.00 packs/day for 20 years    Types: Cigarettes  . Smokeless tobacco: Current User  . Alcohol Use: Yes     Comment: Pt denies  . Drug Use: 7.00 per week    Special: "Crack" cocaine, Cocaine, Marijuana     Comment: Cocaine tonight, Marijuana "a long time"  . Sexual Activity: No   Other Topics Concern  . None   Social History Narrative   ** Merged History Encounter **       Additional History:  Pt was seen in his room today. He reports that he  feels  Better about himself as meds are working and does not feel as paranoid about people around and he does feel better than he was at the time of admission though sometimes he feels he has problems trusting people around. Pt agrees to sign in for Voluntary Treatment.  Sleep: Fair  Appetite:  Fair   Assessment:   Musculoskeletal: Strength & Muscle Tone: within normal limits Gait & Station: normal Patient leans: N/A   Psychiatric Specialty Exam: Physical Exam  Nursing note and vitals reviewed. Constitutional: He appears well-developed and well-nourished.  HENT:  Head: Normocephalic and atraumatic.  Eyes: Conjunctivae are normal. Pupils are equal, round, and reactive to light.  Neck: Normal range of motion.  Cardiovascular: Normal heart sounds.   Respiratory: Effort normal.  GI: Soft.  Musculoskeletal: Normal range of motion.  Neurological: He  is alert.  Skin: Skin is warm and dry.  Psychiatric: His mood appears anxious. His affect is angry. His speech is rapid and/or pressured. He is agitated. Thought content is paranoid and delusional. Cognition and memory are impaired. He expresses impulsivity. He expresses homicidal ideation.  Patient was in bed and appeared to be asleep when I first came to see him. He was very labile in his presentation. At times he was friendly and laughing and at other points would get very angry and talk about how he wanted to kill  people. Became more evident as we talked how psychotic and confused he was.    Review of Systems  Constitutional: Negative.   HENT: Negative.   Eyes: Negative.   Respiratory: Negative.   Cardiovascular: Negative.   Gastrointestinal: Negative.   Musculoskeletal: Negative.   Skin: Negative.   Neurological: Negative.   Psychiatric/Behavioral: Positive for depression, hallucinations and substance abuse. Negative for suicidal ideas. The patient is nervous/anxious and has insomnia.     Blood pressure 160/99, pulse 77, temperature 98.2 F (36.8 C), temperature source Oral, resp. rate 20, height 5' 9" (1.753 m), weight 79.379 kg (175 lb), SpO2 100 %.Body mass index is 25.83 kg/(m^2).  General Appearance: Casual  Eye Contact::  Fair  Speech:  Normal Rate  Volume:  Normal  Mood:  Angry, Anxious, Depressed, Dysphoric, Hopeless and helpless  Affect:  Appropriate  Thought Process:  More organized and not as paranoid as he was at time of admission.  Orientation:  Full (Time, Place, and Person)  Thought Content:  Feels paranoid about people around  Still hearing voices but they are much less intense and not as bad as at the time of admission.  Suicidal Thoughts:  Yes.  without intent/plan  Homicidal Thoughts:  No  Memory:  Negative Immediate;   Fair Recent;   Fair Remote;   Fair adequate  Judgement:  Poor  Insight:  Lacking  Psychomotor Activity:  Normal  Concentration:  Poor  Recall:  [employer] of Knowledge:Fair  Language: Fair  Akathisia:  No  Handed:  Right  AIMS (if indicated):     Assets:  Communication Skills Desire for Improvement Housing Physical Health  ADL's:  Intact  Cognition: WNL  Sleep:  Number of Hours: 0.5     Current Medications: Current Facility-Administered Medications  Medication Dose Route Frequency Provider Last Rate Last Dose  . acetaminophen (TYLENOL) tablet 650 mg  650 mg Oral Q6H PRN Clovis Fredrickson, MD   650 mg at 01/22/15 2025  . alum & mag  hydroxide-simeth (MAALOX/MYLANTA) 200-200-20 MG/5ML suspension 30 mL  30 mL Oral Q4H PRN Jolanta B Pucilowska, MD   30 mL at 01/19/15 1559  . chlorproMAZINE (THORAZINE) tablet 100 mg  100 mg Oral BID Gonzella Lex, MD   100 mg at 01/22/15 2024  . cloNIDine (CATAPRES) tablet 0.1 mg  0.1 mg Oral Q6H PRN Rainey Pines, MD   0.1 mg at 01/20/15 2139  . cloNIDine (CATAPRES) tablet 0.1 mg  0.1 mg Oral BID Fritzi Mandes, MD   0.1 mg at 01/22/15 0931  . hydrochlorothiazide (HYDRODIURIL) tablet 25 mg  25 mg Oral Daily Aldean Jewett, MD   25 mg at 01/22/15 0932  . magnesium hydroxide (MILK OF MAGNESIA) suspension 30 mL  30 mL Oral Daily PRN Jolanta B Pucilowska, MD      . nicotine (NICODERM CQ - dosed in mg/24 hours) patch 14 mg  14 mg Transdermal Q0600 Surya K Challa,  MD   14 mg at 01/21/15 1139  . nicotine (NICOTROL) 10 MG inhaler 1 continuous puffing  1 continuous puffing Inhalation PRN Dewain Penning, MD      . traZODone (DESYREL) tablet 100 mg  100 mg Oral QHS PRN Clovis Fredrickson, MD   100 mg at 01/22/15 0426    Lab Results:  Results for orders placed or performed during the hospital encounter of 01/17/15 (from the past 48 hour(s))  Basic metabolic panel     Status: Abnormal   Collection Time: 01/21/15  7:13 AM  Result Value Ref Range   Sodium 139 135 - 145 mmol/L   Potassium 4.0 3.5 - 5.1 mmol/L   Chloride 103 101 - 111 mmol/L   CO2 29 22 - 32 mmol/L   Glucose, Bld 133 (H) 65 - 99 mg/dL   BUN 13 6 - 20 mg/dL   Creatinine, Ser 0.92 0.61 - 1.24 mg/dL   Calcium 9.5 8.9 - 10.3 mg/dL   GFR calc non Af Amer >60 >60 mL/min   GFR calc Af Amer >60 >60 mL/min    Comment: (NOTE) The eGFR has been calculated using the CKD EPI equation. This calculation has not been validated in all clinical situations. eGFR's persistently <60 mL/min signify possible Chronic Kidney Disease.    Anion gap 7 5 - 15  Hemoglobin A1c     Status: None   Collection Time: 01/21/15  7:13 AM  Result Value Ref Range   Hgb  A1c MFr Bld 5.7 4.0 - 6.0 %  TSH     Status: None   Collection Time: 01/21/15  7:13 AM  Result Value Ref Range   TSH 1.798 0.350 - 4.500 uIU/mL  Lipid panel     Status: None   Collection Time: 01/21/15  7:13 AM  Result Value Ref Range   Cholesterol 165 0 - 200 mg/dL   Triglycerides 64 <150 mg/dL   HDL 61 >40 mg/dL   Total CHOL/HDL Ratio 2.7 RATIO   VLDL 13 0 - 40 mg/dL   LDL Cholesterol 91 0 - 99 mg/dL    Comment:        Total Cholesterol/HDL:CHD Risk Coronary Heart Disease Risk Table                     Men   Women  1/2 Average Risk   3.4   3.3  Average Risk       5.0   4.4  2 X Average Risk   9.6   7.1  3 X Average Risk  23.4   11.0        Use the calculated Patient Ratio above and the CHD Risk Table to determine the patient's CHD Risk.        ATP III CLASSIFICATION (LDL):  <100     mg/dL   Optimal  100-129  mg/dL   Near or Above                    Optimal  130-159  mg/dL   Borderline  160-189  mg/dL   High  >190     mg/dL   Very High     Physical Findings: AIMS: Facial and Oral Movements Muscles of Facial Expression: None, normal Lips and Perioral Area: None, normal Jaw: None, normal Tongue: None, normal,Extremity Movements Upper (arms, wrists, hands, fingers): None, normal Lower (legs, knees, ankles, toes): None, normal, Trunk Movements Neck, shoulders, hips: None, normal, Overall Severity Severity  of abnormal movements (highest score from questions above): None, normal Incapacitation due to abnormal movements: None, normal, Dental Status Current problems with teeth and/or dentures?: Yes (some missing teeth.) Does patient usually wear dentures?: No  CIWA:  CIWA-Ar Total: 0 COWS:  COWS Total Score: 4  Treatment Plan continue 100 mg Thorazine twice a day. Increase hydrochlorothiazide to 50 mg a day. Supportive counseling. Patient is still psychotic.  Medical Decision Making:  Continue current meds and consider D/C in the next few days if pt continue s to  improve and HTN is controlled.     [name] 01/22/2015, 11:43 PM

## 2015-01-22 NOTE — Progress Notes (Signed)
Pt has been some what pleasant and cooperative although some what seclusive to his room. Pt has attended some unit activities. Pt's mood and affect has been depressed. Pt denies SI and is able to contract for Safety. Some inappropriate behaviors noted. Pt moved to room 314A due to some inappropriateness toward male peers.

## 2015-01-22 NOTE — Plan of Care (Signed)
Problem: Diagnosis: Increased Risk For Suicide Attempt Goal: LTG-Patient Will Show Positive Response to Medication LTG (by discharge) : Patient will show positive response to medication and will participate in the development of the discharge plan.  Outcome: Progressing Patient taking medications. Denies SI/HI, denying hallucinations.

## 2015-01-22 NOTE — Progress Notes (Signed)
Pt demeanor and behavior has changed significantly over the past 24 hours. He has become increasingly restless, fidgety, and is displaying repetitive motions with his hands. When he closes his door he stands and knocks on it for an various lengths of time. He was seen engaging in masturbation on multiple 15 minute checks, expanding over a period of several hours. He exposed himself to the staff several times. He paced the halls, at one point naked only covered in a blanket. He did get dressed eventually, but continued to be agitated and pace. He was given Trazodone per his request, later than typically allowed, however in his case the administration was deemed beneficial. He was resistant to care and noted several times he had learned his lesson and would not "mess with the staff". He noted no needs, and although periodically pleasant, his mood has become increasingly labile and irritable.

## 2015-01-22 NOTE — Plan of Care (Signed)
Problem: Alteration in thought process Goal: STG-Patient does not respond to command hallucinations Outcome: Progressing Patients reports no hallucinations. Following unit directions, pleasant and cooperative

## 2015-01-22 NOTE — [facility] Group Notes (Signed)
[facility] LCSW Group Therapy  01/22/2015 2:33 PM  Type of Therapy:  Group Therapy  Participation Level:  Did Not Attend  Modes of Intervention:  Discussion, Education, Socialization and Support  Summary of Progress/Problems:Pts were asked to identify what balance means to them. They were encouraged to identify what throws them off balance and how to regain balance.   Nyheim Seufert L Kendahl Bumgardner MSW, LCSWA  01/22/2015, 2:33 PM  

## 2015-01-22 NOTE — Plan of Care (Signed)
Problem: Ineffective individual coping Goal: STG: Pt will be able to identify effective and ineffective STG: Pt will be able to identify effective and ineffective coping patterns  Outcome: Progressing Pt notes understanding of medication compliance. Goal: STG: Patient will remain free from self harm Outcome: Progressing No self harm.  Goal: STG-Increase in ability to manage activities of daily living Outcome: Progressing Patient does ADL's independently.

## 2015-01-23 MED ORDER — CHLORPROMAZINE HCL 100 MG PO TABS: 100.0000 mg | ORAL_TABLET | Freq: Two times a day (BID) | ORAL | Status: DC

## 2015-01-23 MED ORDER — CLONIDINE HCL 0.1 MG PO TABS: 0.1000 mg | ORAL_TABLET | Freq: Two times a day (BID) | ORAL | Status: DC

## 2015-01-23 MED ORDER — HYDROCHLOROTHIAZIDE 50 MG PO TABS: 50.0000 mg | ORAL_TABLET | Freq: Every day | ORAL | Status: DC

## 2015-01-23 NOTE — [facility] Suicide Risk Assessment (Signed)
[facility] Discharge Suicide Risk Assessment   Demographic Factors:  Male with a Long H/O mental illness and is stable with treatment.  Total Time spent with patient: 45 minutes  Musculoskeletal: Strength & Muscle Tone: within normal limits Gait & Station: normal Patient leans: N/A  Psychiatric Specialty Exam: Physical Exam  Nursing note and vitals reviewed.   ROS  Blood pressure 134/83, pulse 89, temperature 97.6 F (36.4 C), temperature source Oral, resp. rate 19, height  (1.753 m), weight 79.379 kg (175 lb), SpO2 100 %.Body mass index is 25.83 kg/(m^2).  General Appearance: Casual  Eye Contact::  Fair  Speech:  Clear and Coherent409  Volume:  Normal  Mood:  NA  Affect:  Appropriate  Thought Process:  Goal Directed  Orientation:  Full (Time, Place, and Person)  Thought Content:  WDL  Suicidal Thoughts:  No  Homicidal Thoughts:  No  Memory:  Immediate;   Fair Recent;   Fair Remote;   Fair adequate  Judgement:  Fair  Insight:  Fair  Psychomotor Activity:  Normal  Concentration:  Fair  Recall:  [employer] of Knowledge:Fair  Language: Fair  Akathisia:  No  Handed:  Right  AIMS (if indicated):     Assets:  Psychologist, counselling Resources/Insurance Talents/Skills  Sleep:  Number of Hours: 4.25  Cognition: WNL  ADL's:  Intact   Have you used any form of tobacco in the last 30 days? (Cigarettes, Smokeless Tobacco, Cigars, and/or Pipes): Yes  Has this patient used any form of tobacco in the last 30 days? (Cigarettes, Smokeless Tobacco, Cigars, and/or Pipes) Yes, A prescription for an FDA-approved tobacco cessation medication was offered at discharge and the patient refused  Mental Status Per Nursing Assessment::   On Admission:  NA  Current Mental Status by Physician: NA  Loss Factors: NA  Historical Factors: Long H/O mental illness  Risk Reduction Factors:   NA  Continued Clinical Symptoms:  Previous Psychiatric Diagnoses and  Treatments  Cognitive Features That Contribute To Risk:  None    Suicide Risk:  Minimal: No identifiable suicidal ideation.  Patients presenting with no risk factors but with morbid ruminations; may be classified as minimal risk based on the severity of the depressive symptoms  Principal Problem: Schizophrenia, paranoid type Discharge Diagnoses:  Patient Active Problem List   Diagnosis Date Noted  . Schizophrenia, paranoid type [F20.0] 01/17/2015  . Suicidal ideation [R45.851]   . 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 Chronic paranoid schizophrenia [F20.0] 09/07/2014  . 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 Acute CHF (congestive heart failure) [I50.9] 07/29/2014  . Cocaine use disorder, severe, dependence [F14.20]   . Essential hypertension, benign [I10] 03/28/2013  . Diabetes mellitus [E11.9] 03/15/2013    Follow-up Information    Go to [facility]   Specialty:  Behavioral Health   Why:  Hospital Follow up, Outpatient medication management, Walk-in Monday-Friday between 8:30am-5pm.  Appointments are first come first serve arrive as early as possible to minimize wait times.   Contact information:   [address] [place] [address] [phone]       Plan Of Care/Follow-up recommendations:  Other:  addressed  Is patient on multiple antipsychotic therapies at discharge:  No   Has Patient had three or more failed trials of antipsychotic monotherapy by history:  No  Recommended Plan for Multiple Antipsychotic Therapies: NA    Oaklynn Stierwalt K 01/23/2015, 2:05 PM

## 2015-01-23 NOTE — Discharge Summary (Signed)
Physician Discharge Summary Note  Patient:  [name] is an 54 y.o., male with a long H/o mental illness and is treated and is sable and complaince issue addressed and is ready to D/C to [facility]. MRN:  [id number] DOB:  March 07, 1961 Patient phone:  [phone] (home)  Patient address:   [address] [place] [address],  Total Time spent with patient: 45 minutes  Date of Admission:  01/17/2015 Date of Discharge: 01/23/2015  Reason for Admission:  Non Complince and decompensated because he was using cocaine instead of talking his meds.  Principal Problem: Schizophrenia, paranoid type Discharge Diagnoses: Patient Active Problem List   Diagnosis Date Noted  . Schizophrenia, paranoid type [F20.0] 01/17/2015  . Suicidal ideation [R45.851]   . 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 Chronic paranoid schizophrenia [F20.0] 09/07/2014  . 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 Acute CHF (congestive heart failure) [I50.9] 07/29/2014  . Cocaine use disorder, severe, dependence [F14.20]   . Essential hypertension, benign [I10] 03/28/2013  . Diabetes mellitus [E11.9] 03/15/2013    Musculoskeletal: Strength & Muscle Tone: within normal limits Gait & Station: normal Patient leans: N/A  Psychiatric Specialty Exam: Physical Exam  Nursing note and vitals reviewed.   ROS  Blood pressure 134/83, pulse 89, temperature 97.6 F (36.4 C), temperature source Oral, resp. rate 19, height _0  (1.753 m), weight 79.379 kg (175 lb), SpO2 100 %.Body mass index is 25.83 kg/(m^2).   Have you used any form of tobacco in the last 30 days? (Cigarettes, Smokeless Tobacco, Cigars, and/or Pipes): Yes  Has this patient used any form of tobacco in the last 30 days? (Cigarettes, Smokeless Tobacco, Cigars, and/or Pipes) No Please review SRA. Past Medical History:  Past Medical History  Diagnosis Date  . Diabetes mellitus  without complication   . Hypertension   . Schizophrenia   . CHF (congestive heart failure)   . Neuropathy   . Polysubstance abuse   . Cocaine abuse   . Homelessness   . Hepatitis C   . Chronic foot pain   . Homelessness    History reviewed. No pertinent past surgical history. Family History:  Family History  Problem Relation Age of Onset  . Hypertension Other   . Diabetes Other    Social History:  History  Alcohol Use  . Yes    Comment: Pt denies     History  Drug Use  . 7.00 per week  . Special: "Crack" cocaine, Cocaine, Marijuana    Comment: Cocaine tonight, Marijuana "a long time"    History   Social History  . Marital Status: Divorced    Spouse Name: N/A  . Number of Children: N/A  . Years of Education: N/A   Social History Main Topics  . Smoking status: Current Every Day Smoker -- 1.00 packs/day for 20 years    Types: Cigarettes  . Smokeless tobacco: Current User  . Alcohol Use: Yes     Comment: Pt denies  . Drug Use: 7.00 per week    Special: "Crack" cocaine, Cocaine, Marijuana     Comment: Cocaine tonight, Marijuana "a long time"  . Sexual Activity: No   Other Topics Concern  . None   Social History Narrative   ** Merged History Encounter **        Past Psychiatric History: Hospitalizations:  Outpatient Care:  Substance Abuse Care:  Self-Mutilation:  Suicidal Attempts:  Violent Behaviors:   Risk to Self: Is  patient at risk for suicide?: No Risk to Others:   Prior Inpatient Therapy:   Prior Outpatient Therapy:    Level of Care:  OP  Hospital Course:  Improved.  Consults:  hospitalist for Mgt of HTN  Significant Diagnostic Studies:  None  Discharge Vitals:   Blood pressure 134/83, pulse 89, temperature 97.6 F (36.4 C), temperature source Oral, resp. rate 19, height _0  (1.753 m), weight 79.379 kg (175 lb), SpO2 100 %. Body mass index is 25.83 kg/(m^2). Lab Results:   Results for orders placed or performed during the hospital  encounter of 01/17/15 (from the past 72 hour(s))  Basic metabolic panel     Status: Abnormal   Collection Time: 01/21/15  7:13 AM  Result Value Ref Range   Sodium 139 135 - 145 mmol/L   Potassium 4.0 3.5 - 5.1 mmol/L   Chloride 103 101 - 111 mmol/L   CO2 29 22 - 32 mmol/L   Glucose, Bld 133 (H) 65 - 99 mg/dL   BUN 13 6 - 20 mg/dL   Creatinine, Ser 0.92 0.61 - 1.24 mg/dL   Calcium 9.5 8.9 - 10.3 mg/dL   GFR calc non Af Amer >60 >60 mL/min   GFR calc Af Amer >60 >60 mL/min    Comment: (NOTE) The eGFR has been calculated using the CKD EPI equation. This calculation has not been validated in all clinical situations. eGFR's persistently <60 mL/min signify possible Chronic Kidney Disease.    Anion gap 7 5 - 15  Hemoglobin A1c     Status: None   Collection Time: 01/21/15  7:13 AM  Result Value Ref Range   Hgb A1c MFr Bld 5.7 4.0 - 6.0 %  TSH     Status: None   Collection Time: 01/21/15  7:13 AM  Result Value Ref Range   TSH 1.798 0.350 - 4.500 uIU/mL  Lipid panel     Status: None   Collection Time: 01/21/15  7:13 AM  Result Value Ref Range   Cholesterol 165 0 - 200 mg/dL   Triglycerides 64 <150 mg/dL   HDL 61 >40 mg/dL   Total CHOL/HDL Ratio 2.7 RATIO   VLDL 13 0 - 40 mg/dL   LDL Cholesterol 91 0 - 99 mg/dL    Comment:        Total Cholesterol/HDL:CHD Risk Coronary Heart Disease Risk Table                     Men   Women  1/2 Average Risk   3.4   3.3  Average Risk       5.0   4.4  2 X Average Risk   9.6   7.1  3 X Average Risk  23.4   11.0        Use the calculated Patient Ratio above and the CHD Risk Table to determine the patient's CHD Risk.        ATP III CLASSIFICATION (LDL):  <100     mg/dL   Optimal  100-129  mg/dL   Near or Above                    Optimal  130-159  mg/dL   Borderline  160-189  mg/dL   High  >190     mg/dL   Very High     Physical Findings: AIMS: Facial and Oral Movements Muscles of Facial Expression: None, normal Lips and Perioral  Area: None, normal Jaw: None,  normal Tongue: None, normal,Extremity Movements Upper (arms, wrists, hands, fingers): None, normal Lower (legs, knees, ankles, toes): None, normal, Trunk Movements Neck, shoulders, hips: None, normal, Overall Severity Severity of abnormal movements (highest score from questions above): None, normal Incapacitation due to abnormal movements: None, normal, Dental Status Current problems with teeth and/or dentures?: Yes (some missing teeth.) Does patient usually wear dentures?: No  CIWA:  CIWA-Ar Total: 0 COWS:  COWS Total Score: 4   See Psychiatric Specialty Exam and Suicide Risk Assessment completed by Attending Physician prior to discharge.  Discharge destination:  Other:  Homeless shelter  Is patient on multiple antipsychotic therapies at discharge:  No   Has Patient had three or more failed trials of antipsychotic monotherapy by history:  No    Recommended Plan for Multiple Antipsychotic Therapies: NA  Discharge Instructions    Diet - low sodium heart healthy    Complete by:  As directed      Increase activity slowly    Complete by:  As directed             Medication List    TAKE these medications      Indication   chlorproMAZINE 100 MG tablet  Commonly known as:  THORAZINE  Take 1 tablet (100 mg total) by mouth 2 (two) times daily.      cloNIDine 0.1 MG tablet  Commonly known as:  CATAPRES  Take 1 tablet (0.1 mg total) by mouth 2 (two) times daily.      hydrochlorothiazide 50 MG tablet  Commonly known as:  HYDRODIURIL  Take 1 tablet (50 mg total) by mouth daily.            Follow-up Information    Go to [facility].   Specialty:  Behavioral Health   Why:  Hospital Follow up, Outpatient medication management, Walk-in Monday-Friday between 8:30am-5pm.  Appointments are first come first serve arrive as early as possible to minimize wait times.   Contact information:   [place] [address] [phone]        Follow-up recommendations:  Other:  [name]  Comments:  Improved and will take his meds as he was given one month supply with one r.  Total Discharge Time: 7mns  Signed: CDewain Penning7/25/2016, 2:09 PM

## 2015-01-23 NOTE — Progress Notes (Signed)
AVS H&P Discharge Summary faxed to [name] for hospital follow-up °

## 2015-01-23 NOTE — Progress Notes (Signed)
Pt discharged home. DC instructions provided and explained. Medications reviewed. Pt refused to wait on unit until scheduled bus arrival. Encouraged pt to wait. Pt, angered and said he will wait outside of here. Denies SI, HI, AVH.

## 2015-01-23 NOTE — [facility] Group Notes (Signed)
[facility] Group Notes:  (Nursing/MHT/Case Management/Adjunct)  Date:  01/23/2015  Time:  12:56 PM  Type of Therapy:  Psychoeducational Skills  Participation Level:  Did Not Attend   Lynelle Smoke [facility] 01/23/2015, 12:56 PM

## 2015-01-24 NOTE — Progress Notes (Signed)
Recreation Therapy Notes  INPATIENT RECREATION TR PLAN  Patient Details Name: ROCKEY GUARINO MRN: [id number] DOB: Oct 29, 1960 Today's Date: 01/24/2015  Rec Therapy Plan Is patient appropriate for Therapeutic Recreation?: Yes Treatment times per week: At least 2 times a week TR Treatment/Interventions: 1:1 session, Group participation (Comment) (Appropriate participation in daily recreation therapy tx)  Discharge Criteria Pt will be discharged from therapy if:: Discharged Treatment plan/goals/alternatives discussed and agreed upon by:: Patient/family  Discharge Summary Short term goals set: See Care Plan Short term goals met: Complete Progress toward goals comments: One-to-one attended Which groups?: Coping skills, Other (Comment), Leisure education (Self-expression) One-to-one attended: Coping skills Reason goals not met: N/A Therapeutic equipment acquired: None Reason patient discharged from therapy: Discharge from hospital Pt/family agrees with progress & goals achieved: Yes Date patient discharged from therapy: 01/24/15   Leonette [name], LRT/CTRS 01/24/2015, 12:21 PM

## 2015-01-24 NOTE — Progress Notes (Signed)
  [facility] Adult Case Management Discharge Plan :  [facility]:  Yes,  Pt homeless on admission, will return to shelter or streets.  Pt refusal in making any further arrangements At discharge, do you have transportation home?: Yes,  provided with PART bus fare to [place] Do you have the ability to pay for your medications: Yes,  Medicare  Release of information consent forms completed and in the chart;  Patient's signature needed at discharge.  Patient to Follow up at: Follow-up Information    Go to [facility].   Specialty:  Behavioral Health   Why:  Hospital Follow up, Outpatient medication management, Walk-in Monday-Friday between 8:30am-5pm.  Appointments are first come first serve arrive as early as possible to minimize wait times.   Contact information:   [address] [place] [address] [phone]       Patient denies SI/HI: Yes,       Safety Planning and Suicide Prevention discussed: Yes,     Have you used any form of tobacco in the last 30 days? (Cigarettes, Smokeless Tobacco, Cigars, and/or Pipes): Yes  Has patient been referred to the Quitline?: Patient refused referral  Glennon Mac, MSW, LCSW 01/24/2015, 9:13 AM

## 2015-01-24 NOTE — [facility] Suicide Risk Assessment (Signed)
[facility] INPATIENT:  Family/Significant Other Suicide Prevention Education  Suicide Prevention Education:  Patient Refusal for Family/Significant Other Suicide Prevention Education: The patient [name] has refused to provide written consent for family/significant other to be provided Family/Significant Other Suicide Prevention Education during admission and/or prior to discharge.  Physician notified.  Glennon Mac, MSW, LCSW 01/24/2015, 9:13 AM

## 2015-01-25 ENCOUNTER — Encounter (HOSPITAL_COMMUNITY): Payer: Self-pay | Admitting: Emergency Medicine

## 2015-01-25 DIAGNOSIS — J189 Pneumonia, unspecified organism: Secondary | ICD-10-CM | POA: Diagnosis not present

## 2015-01-25 DIAGNOSIS — E860 Dehydration: Secondary | ICD-10-CM | POA: Diagnosis present

## 2015-01-25 DIAGNOSIS — F17210 Nicotine dependence, cigarettes, uncomplicated: Secondary | ICD-10-CM | POA: Diagnosis present

## 2015-01-25 DIAGNOSIS — N179 Acute kidney failure, unspecified: Secondary | ICD-10-CM | POA: Diagnosis not present

## 2015-01-25 DIAGNOSIS — I10 Essential (primary) hypertension: Secondary | ICD-10-CM | POA: Diagnosis present

## 2015-01-25 DIAGNOSIS — Z888 Allergy status to other drugs, medicaments and biological substances status: Secondary | ICD-10-CM

## 2015-01-25 DIAGNOSIS — R45851 Suicidal ideations: Secondary | ICD-10-CM | POA: Diagnosis not present

## 2015-01-25 DIAGNOSIS — Z590 Homelessness: Secondary | ICD-10-CM

## 2015-01-25 DIAGNOSIS — E1140 Type 2 diabetes mellitus with diabetic neuropathy, unspecified: Secondary | ICD-10-CM | POA: Diagnosis present

## 2015-01-25 DIAGNOSIS — B1920 Unspecified viral hepatitis C without hepatic coma: Secondary | ICD-10-CM | POA: Diagnosis present

## 2015-01-25 DIAGNOSIS — F14129 Cocaine abuse with intoxication, unspecified: Secondary | ICD-10-CM | POA: Diagnosis present

## 2015-01-25 DIAGNOSIS — M6282 Rhabdomyolysis: Secondary | ICD-10-CM | POA: Diagnosis present

## 2015-01-25 DIAGNOSIS — F209 Schizophrenia, unspecified: Secondary | ICD-10-CM | POA: Diagnosis present

## 2015-01-25 DIAGNOSIS — I509 Heart failure, unspecified: Secondary | ICD-10-CM | POA: Diagnosis present

## 2015-01-25 LAB — RAPID URINE DRUG SCREEN, HOSP PERFORMED
AMPHETAMINES: NOT DETECTED
BENZODIAZEPINES: NOT DETECTED
Barbiturates: NOT DETECTED
COCAINE: POSITIVE — AB
Opiates: NOT DETECTED
TETRAHYDROCANNABINOL: NOT DETECTED

## 2015-01-25 LAB — COMPREHENSIVE METABOLIC PANEL
ALT: 10 U/L — AB (ref 17–63)
ANION GAP: 12 (ref 5–15)
AST: 24 U/L (ref 15–41)
Albumin: 4.5 g/dL (ref 3.5–5.0)
Alkaline Phosphatase: 83 U/L (ref 38–126)
BUN: 40 mg/dL — ABNORMAL HIGH (ref 6–20)
CHLORIDE: 106 mmol/L (ref 101–111)
CO2: 25 mmol/L (ref 22–32)
Calcium: 9.7 mg/dL (ref 8.9–10.3)
Creatinine, Ser: 3.35 mg/dL — ABNORMAL HIGH (ref 0.61–1.24)
GFR calc Af Amer: 23 mL/min — ABNORMAL LOW (ref >60)
GFR calc non Af Amer: 19 mL/min — ABNORMAL LOW (ref >60)
Glucose, Bld: 147 mg/dL — ABNORMAL HIGH (ref 65–99)
POTASSIUM: 3.7 mmol/L (ref 3.5–5.1)
SODIUM: 143 mmol/L (ref 135–145)
TOTAL PROTEIN: 8 g/dL (ref 6.5–8.1)
Total Bilirubin: 1.2 mg/dL (ref 0.3–1.2)

## 2015-01-25 LAB — CBC
HEMATOCRIT: 41.5 % (ref 39.0–52.0)
Hemoglobin: 13.4 g/dL (ref 13.0–17.0)
MCH: 29.1 pg (ref 26.0–34.0)
MCHC: 32.3 g/dL (ref 30.0–36.0)
MCV: 90.2 fL (ref 78.0–100.0)
PLATELETS: 231 10*3/uL (ref 150–400)
RBC: 4.6 MIL/uL (ref 4.22–5.81)
RDW: 13.3 % (ref 11.5–15.5)
WBC: 8.7 10*3/uL (ref 4.0–10.5)

## 2015-01-25 LAB — ETHANOL

## 2015-01-25 LAB — ACETAMINOPHEN LEVEL: Acetaminophen (Tylenol), Serum: <10 ug/mL — ABNORMAL LOW (ref 10–30)

## 2015-01-25 LAB — SALICYLATE LEVEL: Salicylate Lvl: <4 mg/dL (ref 2.8–30.0)

## 2015-01-25 NOTE — ED Notes (Signed)
pts belongings placed in belongings bag and labeled. Pt placed in burgundy scrubs and wanded by security. [employer] remains at pts bedside.

## 2015-01-25 NOTE — ED Notes (Signed)
PT to ED via [facility]> C/o suicidal ideation.  Pt is voluntary- [employer] with pt at this time.[name]

## 2015-01-26 ENCOUNTER — Emergency Department (HOSPITAL_COMMUNITY): Payer: Medicare Other

## 2015-01-26 ENCOUNTER — Inpatient Hospital Stay (HOSPITAL_COMMUNITY)
Admission: EM | Admit: 2015-01-26 | Discharge: 2015-01-27 | DRG: 682 | Disposition: A | Discharge status: Home / Self Care | Payer: Medicare Other | Attending: Internal Medicine | Admitting: Internal Medicine

## 2015-01-26 DIAGNOSIS — R45851 Suicidal ideations: Secondary | ICD-10-CM | POA: Diagnosis present

## 2015-01-26 DIAGNOSIS — I10 Essential (primary) hypertension: Secondary | ICD-10-CM | POA: Diagnosis present

## 2015-01-26 DIAGNOSIS — F17210 Nicotine dependence, cigarettes, uncomplicated: Secondary | ICD-10-CM | POA: Diagnosis present

## 2015-01-26 DIAGNOSIS — F209 Schizophrenia, unspecified: Secondary | ICD-10-CM

## 2015-01-26 DIAGNOSIS — F14129 Cocaine abuse with intoxication, unspecified: Secondary | ICD-10-CM | POA: Diagnosis present

## 2015-01-26 DIAGNOSIS — N179 Acute kidney failure, unspecified: Secondary | ICD-10-CM | POA: Diagnosis not present

## 2015-01-26 DIAGNOSIS — J189 Pneumonia, unspecified organism: Secondary | ICD-10-CM | POA: Diagnosis not present

## 2015-01-26 DIAGNOSIS — R748 Abnormal levels of other serum enzymes: Secondary | ICD-10-CM

## 2015-01-26 DIAGNOSIS — E1140 Type 2 diabetes mellitus with diabetic neuropathy, unspecified: Secondary | ICD-10-CM | POA: Diagnosis present

## 2015-01-26 DIAGNOSIS — M6282 Rhabdomyolysis: Secondary | ICD-10-CM | POA: Diagnosis present

## 2015-01-26 DIAGNOSIS — F1420 Cocaine dependence, uncomplicated: Secondary | ICD-10-CM | POA: Diagnosis not present

## 2015-01-26 DIAGNOSIS — F200 Paranoid schizophrenia: Secondary | ICD-10-CM

## 2015-01-26 DIAGNOSIS — E119 Type 2 diabetes mellitus without complications: Secondary | ICD-10-CM

## 2015-01-26 DIAGNOSIS — Z794 Long term (current) use of insulin: Secondary | ICD-10-CM

## 2015-01-26 DIAGNOSIS — I509 Heart failure, unspecified: Secondary | ICD-10-CM | POA: Diagnosis present

## 2015-01-26 DIAGNOSIS — Z590 Homelessness: Secondary | ICD-10-CM | POA: Diagnosis not present

## 2015-01-26 DIAGNOSIS — E860 Dehydration: Secondary | ICD-10-CM | POA: Diagnosis present

## 2015-01-26 DIAGNOSIS — F1410 Cocaine abuse, uncomplicated: Secondary | ICD-10-CM

## 2015-01-26 DIAGNOSIS — B1920 Unspecified viral hepatitis C without hepatic coma: Secondary | ICD-10-CM | POA: Diagnosis present

## 2015-01-26 DIAGNOSIS — D72829 Elevated white blood cell count, unspecified: Secondary | ICD-10-CM

## 2015-01-26 DIAGNOSIS — Z888 Allergy status to other drugs, medicaments and biological substances status: Secondary | ICD-10-CM | POA: Diagnosis not present

## 2015-01-26 LAB — BASIC METABOLIC PANEL
Anion gap: 11 (ref 5–15)
BUN: 45 mg/dL — ABNORMAL HIGH (ref 6–20)
CALCIUM: 9.6 mg/dL (ref 8.9–10.3)
CO2: 27 mmol/L (ref 22–32)
CREATININE: 2.7 mg/dL — AB (ref 0.61–1.24)
Chloride: 102 mmol/L (ref 101–111)
GFR, EST AFRICAN AMERICAN: 29 mL/min — AB (ref >60)
GFR, EST NON AFRICAN AMERICAN: 25 mL/min — AB (ref >60)
GLUCOSE: 110 mg/dL — AB (ref 65–99)
POTASSIUM: 3.7 mmol/L (ref 3.5–5.1)
Sodium: 140 mmol/L (ref 135–145)

## 2015-01-26 LAB — GLUCOSE, CAPILLARY
GLUCOSE-CAPILLARY: 116 mg/dL — AB (ref 65–99)
GLUCOSE-CAPILLARY: 139 mg/dL — AB (ref 65–99)
GLUCOSE-CAPILLARY: 116 mg/dL — AB (ref 65–99)
Glucose-Capillary: 121 mg/dL — ABNORMAL HIGH (ref 65–99)
Glucose-Capillary: 162 mg/dL — ABNORMAL HIGH (ref 65–99)

## 2015-01-26 LAB — I-STAT CG4 LACTIC ACID, ED: LACTIC ACID, VENOUS: 1.43 mmol/L (ref 0.5–2.0)

## 2015-01-26 LAB — CK: Total CK: 607 U/L — ABNORMAL HIGH (ref 49–397)

## 2015-01-26 MED ORDER — SODIUM CHLORIDE 0.9 % IV SOLN
1000.0000 mL | INTRAVENOUS | Status: DC
  Administered 2015-01-26 (×2): 1000 mL via INTRAVENOUS

## 2015-01-26 MED ORDER — HEPARIN SODIUM (PORCINE) 5000 UNIT/ML IJ SOLN
5000.0000 [IU] | Freq: Three times a day (TID) | INTRAMUSCULAR | Status: DC
  Administered 2015-01-26 – 2015-01-27 (×3): 5000 [IU] via SUBCUTANEOUS
  Filled 2015-01-26 (×3): qty 1

## 2015-01-26 MED ORDER — CLONIDINE HCL 0.1 MG PO TABS
0.1000 mg | ORAL_TABLET | Freq: Two times a day (BID) | ORAL | Status: DC
  Administered 2015-01-26 – 2015-01-27 (×2): 0.1 mg via ORAL
  Filled 2015-01-26 (×2): qty 1

## 2015-01-26 MED ORDER — IBUPROFEN 200 MG PO TABS
400.0000 mg | ORAL_TABLET | Freq: Once | ORAL | Status: AC
Start: 2015-01-26 — End: 2015-01-26
  Administered 2015-01-26: 400 mg via ORAL
  Filled 2015-01-26: qty 2

## 2015-01-26 MED ORDER — INSULIN ASPART 100 UNIT/ML ~~LOC~~ SOLN
0.0000 [IU] | Freq: Three times a day (TID) | SUBCUTANEOUS | Status: DC
  Administered 2015-01-26: 2 [IU] via SUBCUTANEOUS
  Administered 2015-01-26 – 2015-01-27 (×3): 1 [IU] via SUBCUTANEOUS
  Administered 2015-01-27: 2 [IU] via SUBCUTANEOUS

## 2015-01-26 MED ORDER — SODIUM CHLORIDE 0.9 % IV SOLN
1000.0000 mL | Freq: Once | INTRAVENOUS | Status: AC
  Administered 2015-01-26: 1000 mL via INTRAVENOUS

## 2015-01-26 MED ORDER — CHLORPROMAZINE HCL 100 MG PO TABS
100.0000 mg | ORAL_TABLET | Freq: Two times a day (BID) | ORAL | Status: DC
  Administered 2015-01-26: 100 mg via ORAL
  Filled 2015-01-26 (×4): qty 1

## 2015-01-26 MED ORDER — ARIPIPRAZOLE 5 MG PO TABS
5.0000 mg | ORAL_TABLET | Freq: Two times a day (BID) | ORAL | Status: DC
  Administered 2015-01-26 – 2015-01-27 (×2): 5 mg via ORAL
  Filled 2015-01-26 (×4): qty 1

## 2015-01-26 MED ORDER — CHLORPROMAZINE HCL 25 MG PO TABS
100.0000 mg | ORAL_TABLET | Freq: Once | ORAL | Status: AC
  Administered 2015-01-26: 100 mg via ORAL
  Filled 2015-01-26: qty 4

## 2015-01-26 NOTE — H&P (Addendum)
[facility] History and Physical  [name] ZOX:[id number] DOB: 13-Jan-1961 DOA: 01/26/2015  Referring physician: EDP PCP: No PCP Per Patient   Chief Complaint: Suicidal   HPI: [name] is a 54 y.o. male with h/o schizophrenia, homelessness, polysubstance abuse especially cocaine abuse.  Patient apparently was running around naked outside today in the heat all day.  He is brought in by [facility] after he says he was feeling suicidal.  He states he was running around naked because he believes there was a snake in his pants biting him.  He has 34 ed visits in the past 6 months and apparently is well known to the ED.  Today however unlike usual, his lab work demonstrates AKF with creatinine of 3.3.  His baseline creatinine is less than 1.  Review of Systems: Systems reviewed.  As above, otherwise negative  Past Medical History  Diagnosis Date  . Diabetes mellitus without complication   . Hypertension   . Schizophrenia   . CHF (congestive heart failure)   . Neuropathy   . Polysubstance abuse   . Cocaine abuse   . Homelessness   . Hepatitis C   . Chronic foot pain   . Homelessness    History reviewed. No pertinent past surgical history. Social History:  reports that he has been smoking Cigarettes.  He has a 20 pack-year smoking history. He uses smokeless tobacco. He reports that he drinks alcohol. He reports that he uses illicit drugs ("Crack" cocaine, Cocaine, and Marijuana) about 7 times per week.  Allergies  Allergen Reactions  . Haldol [Haloperidol] Other (See Comments)    Muscle spasms, loss of voluntary movement. However, pt has taken Thorazine on multiple occasions with no adverse effects.     Family History  Problem Relation Age of Onset  . Hypertension Other   . Diabetes Other      Prior to Admission medications   Medication Sig Start Date End Date Taking? Authorizing Provider  chlorproMAZINE (THORAZINE) 100 MG tablet Take 1 tablet (100 mg  total) by mouth 2 (two) times daily. 01/23/15   Beau Fanny, MD  cloNIDine (CATAPRES) 0.1 MG tablet Take 1 tablet (0.1 mg total) by mouth 2 (two) times daily. 01/23/15   Beau Fanny, MD  hydrochlorothiazide (HYDRODIURIL) 50 MG tablet Take 1 tablet (50 mg total) by mouth daily. 01/23/15   Beau Fanny, MD   Physical Exam: Filed Vitals:   01/25/15 2214  BP: 132/80  Pulse: 97  Temp: 98 F (36.7 C)  Resp: 18    BP 132/80 mmHg  Pulse 97  Temp(Src) 98 F (36.7 C) (Oral)  Resp 18  SpO2 95%  General Appearance:    Alert, oriented, no distress, appears stated age  Head:    Normocephalic, atraumatic  Eyes:    PERRL, EOMI, sclera non-icteric        Nose:   Nares without drainage or epistaxis. Mucosa, turbinates normal  Throat:   Moist mucous membranes. Oropharynx without erythema or exudate.  Neck:   Supple. No carotid bruits.  No thyromegaly.  No lymphadenopathy.   Back:     No CVA tenderness, no spinal tenderness  Lungs:     Clear to auscultation bilaterally, without wheezes, rhonchi or rales  Chest wall:    No tenderness to palpitation  Heart:    Regular rate and rhythm without murmurs, gallops, rubs  Abdomen:     Soft, non-tender, nondistended, normal bowel sounds, no organomegaly  Genitalia:  deferred  Rectal:    deferred  Extremities:   No clubbing, cyanosis or edema.  Pulses:   2+ and symmetric all extremities  Skin:   Skin color, texture, turgor normal, no rashes or lesions  Lymph nodes:   Cervical, supraclavicular, and axillary nodes normal  Neurologic:   CNII-XII intact. Normal strength, sensation and reflexes      throughout    Labs on Admission:  Basic Metabolic Panel:  Recent Labs Lab 01/21/15 0713 01/25/15 2212  NA 139 143  K 4.0 3.7  CL 103 106  CO2 29 25  GLUCOSE 133* 147*  BUN 13 40*  CREATININE 0.92 3.35*  CALCIUM 9.5 9.7   Liver Function Tests:  Recent Labs Lab 01/25/15 2212  AST 24  ALT 10*  ALKPHOS 83  BILITOT 1.2  PROT 8.0   ALBUMIN 4.5   No results for input(s): LIPASE, AMYLASE in the last 168 hours. No results for input(s): AMMONIA in the last 168 hours. CBC:  Recent Labs Lab 01/25/15 2212  WBC 8.7  HGB 13.4  HCT 41.5  MCV 90.2  PLT 231   Cardiac Enzymes: No results for input(s): CKTOTAL, CKMB, CKMBINDEX, TROPONINI in the last 168 hours.  BNP (last 3 results)  Recent Labs  04/01/14 2214  PROBNP 585.7*   CBG: No results for input(s): GLUCAP in the last 168 hours.  Radiological Exams on Admission: No results found.  EKG: Independently reviewed.  Assessment/Plan Principal Problem:   Acute kidney failure Active Problems:   Diabetes mellitus   Cocaine use disorder, severe, dependence   Chronic paranoid schizophrenia   Suicidal ideation   1. AKF - due either to dehydration or due to cocaine induced rhabdo 1. UA and CPK are pending 2. In either of the above cases, the treatment is IVF 3. Got bolus in ED and IVF going at 125 cc/hr, if rhabdo may need to adjust rate up 4. Repeat BMP in AM 5. Hold HCTZ 6. Currently getting renal ultrasound 2. Schizophrenia - putting patient back on thorazine (which he also requests). 1. Needs psych eval in AM for suicidal ideations 2. Sitter at bedside 3. Definitely seems psychotic on my exam, but apparently this is also baseline for this patient I am told by multiple people in the ED who know him.  So not clear how much of this is due to acute cocaine intoxication. 3. DM2 - on no meds at home 1. Putting patient on carb mod diet and sensitive scale SSI AC/HS    Code Status: Full  Family Communication: No family in room Disposition Plan: Admit to inpatient   Time spent: 70 min  GARDNER, JARED M. [facility] Pager [phone]  If 7AM-7PM, please contact the day team taking care of the patient [email or web address] Password [facility] 01/26/2015, 3:40 AM

## 2015-01-26 NOTE — Clinical Documentation Improvement (Signed)
Pleas specify type and acuity of CHF: Possible Clinical Conditions?  Chronic Systolic Congestive Heart Failure Chronic Diastolic Congestive Heart Failure Chronic Systolic & Diastolic Congestive Heart Failure Acute Systolic Congestive Heart Failure Acute Diastolic Congestive Heart Failure Acute Systolic & Diastolic Congestive Heart Failure Acute on Chronic Systolic Congestive Heart Failure Acute on Chronic Diastolic Congestive Heart Failure Acute on Chronic Systolic & Diastolic Congestive Heart Failure Other Condition Cannot Clinically Determine  Supporting Information: " Hx of CHF"  Thank You, Nevin Bloodgood, RN, BSN, CCDS,Clinical Documentation Specialist:  [phone]  [phone]=[facility]- Health Information Management

## 2015-01-26 NOTE — ED Provider Notes (Signed)
CSN: [id number]     Arrival date & time 01/25/15  2158 History  This chart was scribed for Dione Booze, MD by Doreatha Martin, ED Scribe. This patient was seen in room A08C/A08C and the patient's care was started at 2:46 AM.     Chief Complaint  Patient presents with  . Suicidal   The history is provided by the patient and the police. No language interpreter was used.    HPI Comments: [name] is a 54 y.o. male brought in by law enforcement with Hx of DM, HTN, schizophrenia, CHF, neuropathy, polysubstance abuse, cocaine abuse, homelessness, Hepatitis C, chronic foot pain who presents to the Emergency Department requesting Thorazine and entrance into a nursing facility. He states associated nausea. Pt states that he takes Thorazine normally and has not taken it today. He states that he has not been drinking adequate amounts of fluids recently. Per officers, he was found naked today. Pt states that he believed a snake was in his pants biting him and undressed because of this. Pt was seen in the ED for suicidal ideation on 01/16/15 and 01/13/15, as well as 30 other ED visits for various complaints in the past 6 months. Pt has an ED Care Plan. He denies difficulty urinating, hallucinations, SI. Pt is allergic to Haldol.   Past Medical History  Diagnosis Date  . Diabetes mellitus without complication   . Hypertension   . Schizophrenia   . CHF (congestive heart failure)   . Neuropathy   . Polysubstance abuse   . Cocaine abuse   . Homelessness   . Hepatitis C   . Chronic foot pain   . Homelessness    History reviewed. No pertinent past surgical history. Family History  Problem Relation Age of Onset  . Hypertension Other   . Diabetes Other    History  Substance Use Topics  . Smoking status: Current Every Day Smoker -- 1.00 packs/day for 20 years    Types: Cigarettes  . Smokeless tobacco: Current User  . Alcohol Use: Yes     Comment: Pt denies    Review of Systems   Gastrointestinal: Positive for nausea.  Genitourinary: Negative for difficulty urinating.  Psychiatric/Behavioral: Negative for suicidal ideas and hallucinations.  All other systems reviewed and are negative.  Allergies  Haldol  Home Medications   Prior to Admission medications   Medication Sig Start Date End Date Taking? Authorizing Provider  chlorproMAZINE (THORAZINE) 100 MG tablet Take 1 tablet (100 mg total) by mouth 2 (two) times daily. 01/23/15   Beau Fanny, MD  cloNIDine (CATAPRES) 0.1 MG tablet Take 1 tablet (0.1 mg total) by mouth 2 (two) times daily. 01/23/15   Beau Fanny, MD  hydrochlorothiazide (HYDRODIURIL) 50 MG tablet Take 1 tablet (50 mg total) by mouth daily. 01/23/15   Beau Fanny, MD   BP 132/80 mmHg  Pulse 97  Temp(Src) 98 F (36.7 C) (Oral)  Resp 18  SpO2 95% Physical Exam  Constitutional: He is oriented to person, place, and time. He appears well-developed and well-nourished.  HENT:  Head: Normocephalic and atraumatic.  Eyes: EOM are normal. Pupils are equal, round, and reactive to light.  Neck: Normal range of motion. Neck supple. No JVD present.  Cardiovascular: Normal rate, regular rhythm and normal heart sounds.   No murmur heard. Pulmonary/Chest: Effort normal and breath sounds normal. He has no wheezes. He has no rales. He exhibits no tenderness.  Abdominal: Soft. Bowel sounds are normal. He exhibits  no distension and no mass. There is no tenderness.  Musculoskeletal: Normal range of motion. He exhibits no edema.  Lymphadenopathy:    He has no cervical adenopathy.  Neurological: He is alert and oriented to person, place, and time. No cranial nerve deficit. He exhibits normal muscle tone. Coordination normal.  Skin: Skin is warm and dry. No rash noted.  Psychiatric:  Disordered thought process.   Nursing note and vitals reviewed.   ED Course  Procedures (including critical care time) DIAGNOSTIC STUDIES: Oxygen Saturation is 95% on RA,  adequate by my interpretation.    COORDINATION OF CARE: 2:54 AM Discussed treatment plan with pt at bedside and pt agreed to plan.   Labs Review Results for orders placed or performed during the hospital encounter of 01/26/15  Comprehensive metabolic panel  Result Value Ref Range   Sodium 143 135 - 145 mmol/L   Potassium 3.7 3.5 - 5.1 mmol/L   Chloride 106 101 - 111 mmol/L   CO2 25 22 - 32 mmol/L   Glucose, Bld 147 (H) 65 - 99 mg/dL   BUN 40 (H) 6 - 20 mg/dL   Creatinine, Ser 1.61 (H) 0.61 - 1.24 mg/dL   Calcium 9.7 8.9 - 09.6 mg/dL   Total Protein 8.0 6.5 - 8.1 g/dL   Albumin 4.5 3.5 - 5.0 g/dL   AST 24 15 - 41 U/L   ALT 10 (L) 17 - 63 U/L   Alkaline Phosphatase 83 38 - 126 U/L   Total Bilirubin 1.2 0.3 - 1.2 mg/dL   GFR calc non Af Amer 19 (L) >60 mL/min   GFR calc Af Amer 23 (L) >60 mL/min   Anion gap 12 5 - 15  Ethanol (ETOH)  Result Value Ref Range   Alcohol, Ethyl (B) <5 <5 mg/dL  Salicylate level  Result Value Ref Range   Salicylate Lvl <4.0 2.8 - 30.0 mg/dL  Acetaminophen level  Result Value Ref Range   Acetaminophen (Tylenol), Serum <10 (L) 10 - 30 ug/mL  CBC  Result Value Ref Range   WBC 8.7 4.0 - 10.5 K/uL   RBC 4.60 4.22 - 5.81 MIL/uL   Hemoglobin 13.4 13.0 - 17.0 g/dL   HCT 04.5 40.9 - 81.1 %   MCV 90.2 78.0 - 100.0 fL   MCH 29.1 26.0 - 34.0 pg   MCHC 32.3 30.0 - 36.0 g/dL   RDW 91.4 78.2 - 95.6 %   Platelets 231 150 - 400 K/uL  Urine rapid drug screen (hosp performed) (Not at [facility])  Result Value Ref Range   Opiates NONE DETECTED NONE DETECTED   Cocaine POSITIVE (A) NONE DETECTED   Benzodiazepines NONE DETECTED NONE DETECTED   Amphetamines NONE DETECTED NONE DETECTED   Tetrahydrocannabinol NONE DETECTED NONE DETECTED   Barbiturates NONE DETECTED NONE DETECTED  CK  Result Value Ref Range   Total CK 607 (H) 49 - 397 U/L  Basic metabolic panel  Result Value Ref Range   Sodium 140 135 - 145 mmol/L   Potassium 3.7 3.5 - 5.1 mmol/L   Chloride 102  101 - 111 mmol/L   CO2 27 22 - 32 mmol/L   Glucose, Bld 110 (H) 65 - 99 mg/dL   BUN 45 (H) 6 - 20 mg/dL   Creatinine, Ser 2.13 (H) 0.61 - 1.24 mg/dL   Calcium 9.6 8.9 - 08.6 mg/dL   GFR calc non Af Amer 25 (L) >60 mL/min   GFR calc Af Amer 29 (L) >60 mL/min  Anion gap 11 5 - 15  I-Stat CG4 Lactic Acid, ED  Result Value Ref Range   Lactic Acid, Venous 1.43 0.5 - 2.0 mmol/L    Imaging Review US Renal  01/26/2015   CLINICAL DATA:  Acute renal failure.  EXAM: RENAL / URINARY TRACT ULTRASOUND COMPLETE  COMPARISON:  Noncontrast CT 12/18/2008  FINDINGS: Right Kidney:  Length: 11.5 cm. Echogenicity within normal limits. Two cysts, largest in the lower pole measuring 3.4 cm. No solid mass or hydronephrosis visualized.  Left Kidney:  Length: 11.8 cm. Echogenicity within normal limits. No mass or hydronephrosis visualized.  Bladder:  Bladder wall thickening at 7 mm.  No definite debris.  IMPRESSION: 1. Bladder wall thickening, correlation with urinalysis recommended. 2. No obstructive uropathy. 3. Right renal cysts.   Electronically Signed   By: Rubye Oaks M.D.   On: 01/26/2015 03:48     MDM   Final diagnoses:  Acute kidney injury (nontraumatic)  Elevated CK  Cocaine abuse  Schizophrenia, unspecified type    Patient well-known to the ED for multiple visits related to schizophrenia. He has disorganized thought processes typical of schizophrenia with some paranoid ideation. However, screening labs showed evidence of acute kidney injury with creatinine of 3.35 and BUN of 40. Recent BUN and creatinine was completely normal. He needs to be admitted for evaluation and treatment of acute kidney injury. He is given a liter of saline. Case is discussed with Dr. Julian Reil of [facility] who agrees to admit the patient.  I personally performed the services described in this documentation, which was scribed in my presence. The recorded information has been reviewed and is accurate.     Dione Booze, MD 01/26/15 [phone]

## 2015-01-26 NOTE — Progress Notes (Signed)
Utilization review completed. Haila Dena, RN, BSN. 

## 2015-01-26 NOTE — Progress Notes (Signed)
   [facility]                                                                              Patient Demographics  [name], is a 54 y.o. [name] 16, 1962, FAO:[id number]  Admit date - 01/26/2015   Admitting Physician Taylor Bow, DO  Outpatient Primary MD for the patient is No PCP Per Patient  LOS - 0   Chief Complaint  Patient presents with  . Suicidal      HPI on 01/26/2015 by Dr. Lyda Perone [name] is a 54 y.o. male with h/o schizophrenia, homelessness, polysubstance abuse especially cocaine abuse. Patient apparently was running around naked outside today in the heat all day. He is brought in by [facility] after he says he was feeling suicidal. He states he was running around naked because he believes there was a snake in his pants biting him. He has 34 ed visits in the past 6 months and apparently is well known to the ED. Today however unlike usual, his lab work demonstrates AKF with creatinine of 3.3. His baseline creatinine is less than 1.  Assessment & Plan   Patient admitted earlier this morning by Dr. Lyda Perone.  Please see full H&P for details. Agree with current assessment and plan.  Acute renal failure -Possibly secondary to dehydration versus cocaine induced rhabdomyolysis -Baseline creatinine usually less than 1, upon admission was 3.35 -Currently 2.70, continue normal saline -HCTZ held -Renal US: Bladder wall thickening, no obstructive uropathy, right renal cyst -UA pending  Mild Rhabdomyolysis -CPK 607 -Likely secondary to cocaine -Continue IVF  -Will repeat CPK level in the morning  Hypertension -Continue clonidine, HCTZ held  Diabetes mellitus, type II -Currently on no home medications -Continue insulin sliding-scale CBG monitoring  Schizophrenia/suicidal ideation -Patient states he is no longer feeling suicidal however "need some time" -Continue bedside sitter -Psychiatry consulted and appreciated -Thorazine  restarted  Polysubstance abuse -Toxicology screen positive for cocaine  Code Status: Full  Family Communication: None at bedside  Disposition Plan: Admitted. Pending improvement in creatinine and psych consult  Time Spent in minutes   30 minutes  Procedures  None  Consults   Psychiatry   DVT Prophylaxis  Heparin  [name] D.O. on 01/26/2015 at 12:23 PM  Between 7am to 7pm - Pager - [phone]  After 7pm go to [email or web address] - password [email or web address]  And look for the night coverage person covering for me after hours  [facility] Office  [phone]

## 2015-01-26 NOTE — Progress Notes (Signed)
New Admission Note:   Arrival Method:  Via stretcher Mental Orientation: Alert to self and place  Telemetry: n/a Assessment: Completed Skin: Intact IV: NS@125  Pain: denies Tubes:n/a Safety Measures: Safety Fall Prevention Plan has been given, discussed and signed Admission: Completed [address] Orientation: Patient has been orientated to the room, unit and staff.  Family: none present  Orders have been reviewed and implemented. Will continue to monitor the patient. Call light has been placed within reach and bed alarm has been activated.   De Nurse BSN, Publishing copy number: [phone]

## 2015-01-26 NOTE — Consult Note (Signed)
[facility] Consult   Reason for Consult:  And history of chronic paranoid schizophrenia cocaine intoxication Referring Physician:  Dr. Ree Kida  Patient Identification: WADELL CRADDOCK MRN:  [id number] Principal Diagnosis: Chronic paranoid schizophrenia Diagnosis:   Patient Active Problem List   Diagnosis Date Noted  . Acute kidney failure [N17.9] 01/26/2015  . Schizophrenia, paranoid type [F20.0] 01/17/2015  . Suicidal ideation [R45.851]   . 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 Chronic paranoid schizophrenia [F20.0] 09/07/2014  . 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 Acute CHF (congestive heart failure) [I50.9] 07/29/2014  . Cocaine use disorder, severe, dependence [F14.20]   . Essential hypertension, benign [I10] 03/28/2013  . Diabetes mellitus [E11.9] 03/15/2013    Total Time spent with patient: 1 hour  Subjective:   NICKLOUS ABURTO is a 54 y.o. male patient admitted with psychotic delusions and cocaine intoxication.  HPI:  JANCARLOS THRUN is a 54 y.o. male seen face-to-face for psychiatric consultation and evaluation of psychotic, paranoid bizarre behaviors and cocaine intoxication. Patient has history of schizophrenia, homelessness, polysubstance abuse. Patient reported that he needs a place to live and also want take Abilify instead of Thorazine for his psychosis and schizophrenia. Patient denied current symptoms of suicidal/homicidal ideation intention or plans. Patient is known to the [facility] from his multiple emergency department visits for similar problems secondary to noncompliant with outpatient medication management.  Patient may benefit from long-acting antidepression medication/Depot medication. Patient does not meet criteria for acute psychiatric hospitalization at this time and contract for safety   HPI Elements:   Location:  Substance intoxication  and history of psychosis. Quality:  Poor. Severity:  Noncompliant and bizarre behaviors. Timing:  's relapse of cocaine abuse. Duration:  Few days. Context:  Psychosocial stresses, homelessness and chronic substance abuse and mental illness.  Past Medical History:  Past Medical History  Diagnosis Date  . Diabetes mellitus without complication   . Hypertension   . Schizophrenia   . CHF (congestive heart failure)   . Neuropathy   . Polysubstance abuse   . Cocaine abuse   . Homelessness   . Hepatitis C   . Chronic foot pain   . Homelessness    History reviewed. No pertinent past surgical history. Family History:  Family History  Problem Relation Age of Onset  . Hypertension Other   . Diabetes Other    Social History:  History  Alcohol Use  . Yes    Comment: Pt denies     History  Drug Use  . 7.00 per week  . Special: "Crack" cocaine, Cocaine, Marijuana    Comment: Cocaine tonight, Marijuana "a long time"    History   Social History  . Marital Status: Divorced    Spouse Name: N/A  . Number of Children: N/A  . Years of Education: N/A   Social History Main Topics  . Smoking status: Current Every Day Smoker -- 1.00 packs/day for 20 years    Types: Cigarettes  . Smokeless tobacco: Current User  . Alcohol Use: Yes     Comment: Pt denies  . Drug Use: 7.00 per week    Special: "Crack" cocaine, Cocaine, Marijuana     Comment: Cocaine tonight, Marijuana "a long time"  . Sexual Activity: No   Other Topics Concern  . None   Social History Narrative   ** Merged History Encounter **       Additional Social History:  Allergies:   Allergies  Allergen Reactions  . Haldol [Haloperidol] Other (See Comments)    Muscle spasms, loss of voluntary movement. However, pt has taken Thorazine on multiple occasions with no adverse effects.     Labs:  Results for orders placed or performed during the hospital encounter of 01/26/15 (from  the past 48 hour(s))  Comprehensive metabolic panel     Status: Abnormal   Collection Time: 01/25/15 10:12 PM  Result Value Ref Range   Sodium 143 135 - 145 mmol/L   Potassium 3.7 3.5 - 5.1 mmol/L   Chloride 106 101 - 111 mmol/L   CO2 25 22 - 32 mmol/L   Glucose, Bld 147 (H) 65 - 99 mg/dL   BUN 40 (H) 6 - 20 mg/dL   Creatinine, Ser 3.35 (H) 0.61 - 1.24 mg/dL   Calcium 9.7 8.9 - 10.3 mg/dL   Total Protein 8.0 6.5 - 8.1 g/dL   Albumin 4.5 3.5 - 5.0 g/dL   AST 24 15 - 41 U/L   ALT 10 (L) 17 - 63 U/L   Alkaline Phosphatase 83 38 - 126 U/L   Total Bilirubin 1.2 0.3 - 1.2 mg/dL   GFR calc non Af Amer 19 (L) >60 mL/min   GFR calc Af Amer 23 (L) >60 mL/min    Comment: (NOTE) The eGFR has been calculated using the CKD EPI equation. This calculation has not been validated in all clinical situations. eGFR's persistently <60 mL/min signify possible Chronic Kidney Disease.    Anion gap 12 5 - 15  Ethanol (ETOH)     Status: None   Collection Time: 01/25/15 10:12 PM  Result Value Ref Range   Alcohol, Ethyl (B) <5 <5 mg/dL    Comment:        LOWEST DETECTABLE LIMIT FOR SERUM ALCOHOL IS 5 mg/dL FOR MEDICAL PURPOSES ONLY   Salicylate level     Status: None   Collection Time: 01/25/15 10:12 PM  Result Value Ref Range   Salicylate Lvl <6.9 2.8 - 30.0 mg/dL  Acetaminophen level     Status: Abnormal   Collection Time: 01/25/15 10:12 PM  Result Value Ref Range   Acetaminophen (Tylenol), Serum <10 (L) 10 - 30 ug/mL    Comment:        THERAPEUTIC CONCENTRATIONS VARY SIGNIFICANTLY. A RANGE OF 10-30 ug/mL MAY BE AN EFFECTIVE CONCENTRATION FOR MANY PATIENTS. HOWEVER, SOME ARE BEST TREATED AT CONCENTRATIONS OUTSIDE THIS RANGE. ACETAMINOPHEN CONCENTRATIONS >150 ug/mL AT 4 HOURS AFTER INGESTION AND >50 ug/mL AT 12 HOURS AFTER INGESTION ARE OFTEN ASSOCIATED WITH TOXIC REACTIONS.   CBC     Status: None   Collection Time: 01/25/15 10:12 PM  Result Value Ref Range   WBC 8.7 4.0 - 10.5 K/uL    RBC 4.60 4.22 - 5.81 MIL/uL   Hemoglobin 13.4 13.0 - 17.0 g/dL   HCT 41.5 39.0 - 52.0 %   MCV 90.2 78.0 - 100.0 fL   MCH 29.1 26.0 - 34.0 pg   MCHC 32.3 30.0 - 36.0 g/dL   RDW 13.3 11.5 - 15.5 %   Platelets 231 150 - 400 K/uL  Urine rapid drug screen (hosp performed) (Not at [facility])     Status: Abnormal   Collection Time: 01/25/15 10:25 PM  Result Value Ref Range   Opiates NONE DETECTED NONE DETECTED   Cocaine POSITIVE (A) NONE DETECTED   Benzodiazepines NONE DETECTED NONE DETECTED   Amphetamines NONE DETECTED NONE DETECTED   Tetrahydrocannabinol NONE DETECTED NONE DETECTED  Barbiturates NONE DETECTED NONE DETECTED    Comment:        DRUG SCREEN FOR MEDICAL PURPOSES ONLY.  IF CONFIRMATION IS NEEDED FOR ANY PURPOSE, NOTIFY LAB WITHIN 5 DAYS.        LOWEST DETECTABLE LIMITS FOR URINE DRUG SCREEN Drug Class       Cutoff (ng/mL) Amphetamine      1000 Barbiturate      200 Benzodiazepine   297 Tricyclics       989 Opiates          300 Cocaine          300 THC              50   CK     Status: Abnormal   Collection Time: 01/26/15  3:16 AM  Result Value Ref Range   Total CK 607 (H) 49 - 397 U/L  Basic metabolic panel     Status: Abnormal   Collection Time: 01/26/15  3:16 AM  Result Value Ref Range   Sodium 140 135 - 145 mmol/L   Potassium 3.7 3.5 - 5.1 mmol/L   Chloride 102 101 - 111 mmol/L   CO2 27 22 - 32 mmol/L   Glucose, Bld 110 (H) 65 - 99 mg/dL   BUN 45 (H) 6 - 20 mg/dL   Creatinine, Ser 2.70 (H) 0.61 - 1.24 mg/dL   Calcium 9.6 8.9 - 10.3 mg/dL   GFR calc non Af Amer 25 (L) >60 mL/min   GFR calc Af Amer 29 (L) >60 mL/min    Comment: (NOTE) The eGFR has been calculated using the CKD EPI equation. This calculation has not been validated in all clinical situations. eGFR's persistently <60 mL/min signify possible Chronic Kidney Disease.    Anion gap 11 5 - 15  I-Stat CG4 Lactic Acid, ED     Status: None   Collection Time: 01/26/15  3:30 AM  Result Value Ref  Range   Lactic Acid, Venous 1.43 0.5 - 2.0 mmol/L  Glucose, capillary     Status: Abnormal   Collection Time: 01/26/15  4:35 AM  Result Value Ref Range   Glucose-Capillary 116 (H) 65 - 99 mg/dL  Glucose, capillary     Status: Abnormal   Collection Time: 01/26/15  8:10 AM  Result Value Ref Range   Glucose-Capillary 121 (H) 65 - 99 mg/dL    Vitals: Blood pressure 107/75, pulse 62, temperature 98 F (36.7 C), temperature source Oral, resp. rate 18, weight 79.4 kg (175 lb 0.7 oz), SpO2 99 %.  Risk to Self: Is patient at risk for suicide?: Yes Risk to Others:   Prior Inpatient Therapy:   Prior Outpatient Therapy:    Current Facility-Administered Medications  Medication Dose Route Frequency Provider Last Rate Last Dose  . 0.9 %  sodium chloride infusion  1,000 mL Intravenous Continuous Delora Fuel, MD 211 mL/hr at 01/26/15 0314 1,000 mL at 01/26/15 0314  . chlorproMAZINE (THORAZINE) tablet 100 mg  100 mg Oral BID Etta Quill, DO      . cloNIDine (CATAPRES) tablet 0.1 mg  0.1 mg Oral BID Etta Quill, DO      . heparin injection 5,000 Units  5,000 Units Subcutaneous 3 times per day Etta Quill, DO   5,000 Units at 01/26/15 0600  . insulin aspart (novoLOG) injection 0-9 Units  0-9 Units Subcutaneous TID WC Etta Quill, DO   1 Units at 01/26/15 9417    Musculoskeletal: Strength &  Muscle Tone: decreased Gait & Station: unable to stand Patient leans: N/A  Psychiatric Specialty Exam: Physical Exam  HENT:  Mouth/Throat: Oropharynx is clear and moist.    ROS  No Fever-chills, No Headache, No changes with Vision or hearing, reports vertigo No problems swallowing food or Liquids, No Chest pain, Cough or Shortness of Breath, No Abdominal pain, No Nausea or Vommitting, Bowel movements are regular, No Blood in stool or Urine, No dysuria, No new skin rashes or bruises, No new joints pains-aches,  No new weakness, tingling, numbness in any extremity, No recent weight gain  or loss, No polyuria, polydypsia or polyphagia,   A full 10 point Review of Systems was done, except as stated above, all other Review of Systems were negative.  Blood pressure 107/75, pulse 62, temperature 98 F (36.7 C), temperature source Oral, resp. rate 18, weight 79.4 kg (175 lb 0.7 oz), SpO2 99 %.Body mass index is 25.84 kg/(m^2).  General Appearance: Disheveled and Guarded  Engineer, water::  Fair  Speech:  Clear and Coherent and Slow  Volume:  Decreased  Mood:  Anxious and Depressed  Affect:  Constricted and Depressed  Thought Process:  Disorganized and Tangential  Orientation:  Full (Time, Place, and Person)  Thought Content:  Delusions and Rumination  Suicidal Thoughts:  No  Homicidal Thoughts:  No  Memory:  Immediate;   Fair Recent;   Fair  Judgement:  Impaired  Insight:  Fair  Psychomotor Activity:  Decreased  Concentration:  Fair  Recall:  [employer] of Knowledge:Fair  Language: Good  Akathisia:  Negative  Handed:  Right  AIMS (if indicated):     Assets:  Communication Skills Desire for Improvement Leisure Time Resilience  ADL's:  Impaired  Cognition: Impaired,  Mild  Sleep:      Medical Decision Making: Review of Psycho-Social Stressors (1), Review or order clinical lab tests (1), Established Problem, Worsening (2), Review of Last Therapy Session (1), Review or order medicine tests (1), Review of Medication Regimen & Side Effects (2) and Review of New Medication or Change in Dosage (2)  Treatment Plan Summary: Patient presented with bizarre behavior history of psychosis and polysubstance abuse especially cocaine. Patient contract for safety and being compliant with medication management. Daily contact with patient to assess and evaluate symptoms and progress in treatment and Medication management  Plan:  Safety concerns: Patient has no current suicidal or homicidal ideation, intention or plans Patient contract for safety during my evaluation.  Discontinue  thorazine and start Abilify 5 mg twice daily for psychosis Patient does not meet criteria for psychiatric inpatient admission. Supportive therapy provided about ongoing stressors.  Appreciate psychiatric consultation and we sign off at this time Please contact [phone] or [phone] if needs further assistance   Disposition: Patient will be referred to this unit social worker regarding out-of-home placement as patient requested.   Karlei Waldo,JANARDHAHA R. 01/26/2015 9:41 AM

## 2015-01-26 NOTE — Clinical Social Work Note (Signed)
CSW informed by psychiatrist that patient requesting assistance with housing.  CSW received call from Rhett Bannister with [facility] regarding patient.  CSW will follow-up with Mr. Plaskett and his cousin Candance regarding housing resources.  Genelle Bal, MSW, LCSW Licensed Clinical Social Worker Clinical Social Work Department [employer] [phone]

## 2015-01-26 NOTE — ED Notes (Signed)
Patient transported to Ultrasound 

## 2015-01-27 ENCOUNTER — Inpatient Hospital Stay (HOSPITAL_COMMUNITY): Payer: Medicare Other

## 2015-01-27 DIAGNOSIS — E119 Type 2 diabetes mellitus without complications: Secondary | ICD-10-CM

## 2015-01-27 DIAGNOSIS — F1420 Cocaine dependence, uncomplicated: Secondary | ICD-10-CM

## 2015-01-27 DIAGNOSIS — R45851 Suicidal ideations: Secondary | ICD-10-CM

## 2015-01-27 LAB — BASIC METABOLIC PANEL
ANION GAP: 3 — AB (ref 5–15)
BUN: 17 mg/dL (ref 6–20)
CALCIUM: 8.4 mg/dL — AB (ref 8.9–10.3)
CHLORIDE: 109 mmol/L (ref 101–111)
CO2: 26 mmol/L (ref 22–32)
Creatinine, Ser: 1.24 mg/dL (ref 0.61–1.24)
GFR calc Af Amer: >60 mL/min (ref >60)
GFR calc non Af Amer: >60 mL/min (ref >60)
Glucose, Bld: 180 mg/dL — ABNORMAL HIGH (ref 65–99)
POTASSIUM: 3.8 mmol/L (ref 3.5–5.1)
Sodium: 138 mmol/L (ref 135–145)

## 2015-01-27 LAB — FERRITIN: Ferritin: 61 ng/mL (ref 24–336)

## 2015-01-27 LAB — IRON AND TIBC
Iron: 17 ug/dL — ABNORMAL LOW (ref 45–182)
Saturation Ratios: 5 % — ABNORMAL LOW (ref 17.9–39.5)
TIBC: 333 ug/dL (ref 250–450)
UIBC: 316 ug/dL

## 2015-01-27 LAB — CBC
HEMATOCRIT: 32.7 % — AB (ref 39.0–52.0)
Hemoglobin: 10.6 g/dL — ABNORMAL LOW (ref 13.0–17.0)
MCH: 29.4 pg (ref 26.0–34.0)
MCHC: 32.4 g/dL (ref 30.0–36.0)
MCV: 90.6 fL (ref 78.0–100.0)
PLATELETS: 168 10*3/uL (ref 150–400)
RBC: 3.61 MIL/uL — ABNORMAL LOW (ref 4.22–5.81)
RDW: 13.1 % (ref 11.5–15.5)
WBC: 12.5 10*3/uL — ABNORMAL HIGH (ref 4.0–10.5)

## 2015-01-27 LAB — URINALYSIS, ROUTINE W REFLEX MICROSCOPIC
Bilirubin Urine: NEGATIVE
Glucose, UA: 250 mg/dL — AB
Hgb urine dipstick: NEGATIVE
Ketones, ur: NEGATIVE mg/dL
Leukocytes, UA: NEGATIVE
NITRITE: NEGATIVE
PH: 5.5 (ref 5.0–8.0)
PROTEIN: NEGATIVE mg/dL
Specific Gravity, Urine: 1.023 (ref 1.005–1.030)
Urobilinogen, UA: 0.2 mg/dL (ref 0.0–1.0)

## 2015-01-27 LAB — GLUCOSE, CAPILLARY
Glucose-Capillary: 149 mg/dL — ABNORMAL HIGH (ref 65–99)
Glucose-Capillary: 164 mg/dL — ABNORMAL HIGH (ref 65–99)

## 2015-01-27 LAB — VITAMIN B12: Vitamin B-12: 296 pg/mL (ref 180–914)

## 2015-01-27 LAB — FOLATE: FOLATE: 15.7 ng/mL (ref >5.9)

## 2015-01-27 LAB — RETICULOCYTES
RBC.: 3.73 MIL/uL — AB (ref 4.22–5.81)
RETIC COUNT ABSOLUTE: 29.8 10*3/uL (ref 19.0–186.0)
Retic Ct Pct: 0.8 % (ref 0.4–3.1)

## 2015-01-27 LAB — CK: Total CK: 245 U/L (ref 49–397)

## 2015-01-27 MED ORDER — LEVOFLOXACIN 750 MG PO TABS: 750.0000 mg | ORAL_TABLET | Freq: Every day | ORAL | Status: DC

## 2015-01-27 MED ORDER — CLONIDINE HCL 0.1 MG PO TABS: 0.1000 mg | ORAL_TABLET | Freq: Two times a day (BID) | ORAL | Status: DC

## 2015-01-27 MED ORDER — ACETAMINOPHEN 325 MG PO TABS
650.0000 mg | ORAL_TABLET | Freq: Four times a day (QID) | ORAL | Status: DC | PRN
  Administered 2015-01-27 (×2): 650 mg via ORAL
  Filled 2015-01-27 (×2): qty 2

## 2015-01-27 MED ORDER — LEVOFLOXACIN 750 MG PO TABS
750.0000 mg | ORAL_TABLET | Freq: Every day | ORAL | Status: DC
  Administered 2015-01-27: 750 mg via ORAL
  Filled 2015-01-27: qty 1

## 2015-01-27 MED ORDER — ARIPIPRAZOLE 5 MG PO TABS: 5.0000 mg | ORAL_TABLET | Freq: Two times a day (BID) | ORAL | Status: DC

## 2015-01-27 NOTE — Discharge Summary (Signed)
Physician Discharge Summary  [name] ZOX:[id number] DOB: [birth year] DOA: 01/26/2015  PCP: No PCP Per Patient  Admit date: 01/26/2015 Discharge date: 01/27/2015  Time spent: 45 minutes  Recommendations for Outpatient Follow-up:  Patient will be discharged to home.  Patient will need to follow up with [facility] at the specified time and have repeat CBC and BMP at that time. Patient should continue medications as prescribed.  Patient should follow a carb modified diet.    Discharge Diagnoses:  Acute renal failure Mild rhabdo Hypertension Diabetes not as, type II Schizophrenia/suicidal ideation Polysubstance abuse  Discharge Condition: Stable  Diet recommendation: Carb modified  Filed Weights   01/26/15 0438  Weight: 79.4 kg (175 lb 0.7 oz)    History of present illness:  on 01/26/2015 by Dr. Lyda Perone JACOBY [name] is a 54 y.o. male with h/o schizophrenia, homelessness, polysubstance abuse especially cocaine abuse. Patient apparently was running around naked outside today in the heat all day. He is brought in by [facility] after he says he was feeling suicidal. He states he was running around naked because he believes there was a snake in his pants biting him. He has 34 ed visits in the past 6 months and apparently is well known to the ED. Today however unlike usual, his lab work demonstrates AKF with creatinine of 3.3. His baseline creatinine is less than 1.  Hospital Course:  Acute renal failure -Possibly secondary to dehydration versus cocaine induced rhabdomyolysis -Baseline creatinine usually less than 1, upon admission was 3.35 -Currently 1.24 -HCTZ held -Renal US: Bladder wall thickening, no obstructive uropathy, right renal cyst -UA negative for infection -Recommend repeat BMP within 1 week  Mild Rhabdomyolysis -CPK 607, trending downward, currently 245 -Likely secondary to cocaine -Was placed on IVF  Hypertension -Continue  clonidine, HCTZ held  Diabetes mellitus, type II -Currently on no home medications -During hospitalization, was placed on sliding-scale CBG monitoring  Schizophrenia/suicidal ideation -Patient denies any suicidal thoughts or ideations at this time. -Psychiatry consulted and appreciated and did not recommend inpatient psych -recommended abilify and discontinuing thorazine  Polysubstance abuse -Toxicology screen positive for cocaine -Counseled   Leukocytosis/pneumonia -Chest x-ray showed right lower lobe perihilar interstitial disease concerning for infectious or respiratory process -Patient started on Levaquin -Will repeat CBC in 1 week  Procedures  None  Consults  Psychiatry   Discharge Exam: Filed Vitals:   01/27/15 1051  BP: 135/76  Pulse: 99  Temp: 98.6 F (37 C)  Resp: 18     General: Well developed, well nourished, no distress  HEENT: NCAT, mucous membranes moist.  Cardiovascular: S1 S2 auscultated, no rubs, murmurs or gallops. Regular rate and rhythm.  Respiratory: Clear to auscultation bilaterally with equal chest rise  Abdomen: Soft, nontender, nondistended, + bowel sounds  Extremities: warm dry without cyanosis clubbing or edema  Neuro: AAOx3, nonfocal  Psych: Appropriate   Discharge Instructions      Discharge Instructions    Discharge instructions    Complete by:  As directed   Patient will be discharged to home.  Patient will need to follow up with [facility] at the specified time and have repeat CBC and BMP at that time. Patient should continue medications as prescribed.  Patient should follow a carb modified diet.            Medication List    STOP taking these medications        chlorproMAZINE 100 MG tablet  Commonly known  as:  THORAZINE     hydrochlorothiazide 50 MG tablet  Commonly known as:  HYDRODIURIL      TAKE these medications        ARIPiprazole 5 MG tablet  Commonly known as:  ABILIFY  Take 1  tablet (5 mg total) by mouth 2 (two) times daily.     cloNIDine 0.1 MG tablet  Commonly known as:  CATAPRES  Take 1 tablet (0.1 mg total) by mouth 2 (two) times daily.     levofloxacin 750 MG tablet  Commonly known as:  LEVAQUIN  Take 1 tablet (750 mg total) by mouth daily.       Allergies  Allergen Reactions  . Haldol [Haloperidol] Other (See Comments)    Muscle spasms, loss of voluntary movement. However, pt has taken Thorazine on multiple occasions with no adverse effects.    Follow-up Information    Follow up with [facility]    .   Why:  Appointment , Friday, Feb 03, 2015 at 10am. You may use their pharmacy.    Contact information:   [address] [place] [address] [phone]       The results of significant diagnostics from this hospitalization (including imaging, microbiology, ancillary and laboratory) are listed below for reference.    Significant Diagnostic Studies: US Renal  01/26/2015   CLINICAL DATA:  Acute renal failure.  EXAM: RENAL / URINARY TRACT ULTRASOUND COMPLETE  COMPARISON:  Noncontrast CT 12/18/2008  FINDINGS: Right Kidney:  Length: 11.5 cm. Echogenicity within normal limits. Two cysts, largest in the lower pole measuring 3.4 cm. No solid mass or hydronephrosis visualized.  Left Kidney:  Length: 11.8 cm. Echogenicity within normal limits. No mass or hydronephrosis visualized.  Bladder:  Bladder wall thickening at 7 mm.  No definite debris.  IMPRESSION: 1. Bladder wall thickening, correlation with urinalysis recommended. 2. No obstructive uropathy. 3. Right renal cysts.   Electronically Signed   By: Rubye Oaks M.D.   On: 01/26/2015 03:48   Dg Chest Port 1 View  01/27/2015   CLINICAL DATA:  Leukocytosis, shortness of breath  EXAM: PORTABLE CHEST - 1 VIEW  COMPARISON:  12/21/2014  FINDINGS: Right lower lobe perihilar interstitial disease concerning for an infectious or inflammatory process. Right hilar  prominence again noted. Left lung is clear. No pleural effusion or pneumothorax. There is mild stable cardiomegaly.  The osseous structures are unremarkable.  IMPRESSION: Right lower lobe perihilar interstitial disease concerning for an infectious or inflammatory process.   Electronically Signed   By: Elige Ko   On: 01/27/2015 08:05    Microbiology: Recent Results (from the past 240 hour(s))  Culture, blood (routine x 2)     Status: None (Preliminary result)   Collection Time: 01/27/15  7:54 AM  Result Value Ref Range Status   Specimen Description BLOOD RIGHT ANTECUBITAL  Final   Special Requests BOTTLES DRAWN AEROBIC AND ANAEROBIC 10CC  Final   Culture PENDING  Incomplete   Report Status PENDING  Incomplete     Labs: Basic Metabolic Panel:  Recent Labs Lab 01/21/15 0713 01/25/15 2212 01/26/15 0316 01/27/15 0511  NA 139 143 140 138  K 4.0 3.7 3.7 3.8  CL 103 106 102 109  CO2 29 25 27 26   GLUCOSE 133* 147* 110* 180*  BUN 13 40* 45* 17  CREATININE 0.92 3.35* 2.70* 1.24  CALCIUM 9.5 9.7 9.6 8.4*   Liver Function Tests:  Recent Labs Lab 01/25/15 2212  AST 24  ALT 10*  ALKPHOS 83  BILITOT 1.2  PROT 8.0  ALBUMIN 4.5   No results for input(s): LIPASE, AMYLASE in the last 168 hours. No results for input(s): AMMONIA in the last 168 hours. CBC:  Recent Labs Lab 01/25/15 2212 01/27/15 0511  WBC 8.7 12.5*  HGB 13.4 10.6*  HCT 41.5 32.7*  MCV 90.2 90.6  PLT 231 168   Cardiac Enzymes:  Recent Labs Lab 01/26/15 0316 01/27/15 0511  CKTOTAL 607* 245   BNP: BNP (last 3 results)  Recent Labs  07/28/14 2329  BNP 225.7*    ProBNP (last 3 results)  Recent Labs  04/01/14 2214  PROBNP 585.7*    CBG:  Recent Labs Lab 01/26/15 1136 01/26/15 1721 01/26/15 2130 01/27/15 0741 01/27/15 1207  GLUCAP 162* 139* 116* 149* 164*       Signed:  Elim Economou  [facility] 01/27/2015, 12:52 PM

## 2015-01-27 NOTE — Clinical Social Work Note (Signed)
Patient discharged home earlier today and was provided with a bus pass to get home. Patient reported that he has somewhere to live and wants to get his life together. CSW encouraged patient to follow-up with [facility] regarding his medication and patient expressed agreement.   Genelle Bal, MSW, LCSW Licensed Clinical Social Worker Clinical Social Work Department [employer] [phone]

## 2015-01-27 NOTE — Discharge Instructions (Signed)
Acute Kidney Injury Acute kidney injury is a disease in which there is sudden (acute) damage to the kidneys. The kidneys are 2 organs that lie on either side of the spine between the middle of the back and the front of the abdomen. The kidneys:  Remove wastes and extra water from the blood.   Produce important hormones. These help keep bones strong, regulate blood pressure, and help create red blood cells.   Balance the fluids and chemicals in the blood and tissues. A small amount of kidney damage may not cause problems, but a large amount of damage may make it difficult or impossible for the kidneys to work the way they should. Acute kidney injury may develop into long-lasting (chronic) kidney disease. It may also develop into a life-threatening disease called end-stage kidney disease. Acute kidney injury can get worse very quickly, so it should be treated right away. Early treatment may prevent other kidney diseases from developing.  CAUSES   A problem with blood flow to the kidneys. This may be caused by:   Blood loss.   Heart disease.   Severe burns.   Liver disease.  Direct damage to the kidneys. This may be caused by:  Some medicines.   A kidney infection.   Poisoning or consuming toxic substances.   A surgical wound.   A blow to the kidney area.   A problem with urine flow. This may be caused by:   Cancer.   Kidney stones.   An enlarged prostate. SYMPTOMS   Swelling (edema) of the legs, ankles, or feet.   Tiredness (lethargy).   Nausea or vomiting.   Confusion.   Problems with urination, such as:   Painful or burning feeling during urination.   Decreased urine production.   Frequent accidents in children who are potty trained.   Bloody urine.   Muscle twitches and cramps.   Shortness of breath.   Seizures.   Chest pain or pressure. Sometimes, no symptoms are present. DIAGNOSIS Acute kidney injury may be detected  and diagnosed by tests, including blood, urine, imaging, or kidney biopsy tests.  TREATMENT Treatment of acute kidney injury varies depending on the cause and severity of the kidney damage. In mild cases, no treatment may be needed. The kidneys may heal on their own. If acute kidney injury is more severe, your caregiver will treat the cause of the kidney damage, help the kidneys heal, and prevent complications from occurring. Severe cases may require a procedure to remove toxic wastes from the body (dialysis) or surgery to repair kidney damage. Surgery may involve:   Repair of a torn kidney.   Removal of an obstruction. Most of the time, you will need to stay overnight at the hospital.  HOME CARE INSTRUCTIONS:  Follow your prescribed diet.  Only take over-the-counter or prescription medicines as directed by your caregiver.  Do not take any new medicines (prescription, over-the-counter, or nutritional supplements) unless approved by your caregiver. Many medicines can worsen your kidney damage or need to have the dose adjusted.   Keep all follow-up appointments as directed by your caregiver.  Observe your condition to make sure you are healing as expected. SEEK IMMEDIATE MEDICAL CARE IF:  You are feeling ill or have severe pain in the back or side.   Your symptoms return or you have new symptoms.  You have any symptoms of end-stage kidney disease. These include:   Persistent itchiness.   Loss of appetite.   Headaches.   Abnormally dark   or light skin.  Numbness in the hands or feet.   Easy bruising.   Frequent hiccups.   Menstruation stops.   You have a fever.  You have increased urine production.  You have pain or bleeding when urinating. MAKE SURE YOU:   Understand these instructions.  Will watch your condition.  Will get help right away if you are not doing well or get worse Document Released: 12/31/2010 Document Revised: 10/12/2012 Document  Reviewed: 02/14/2012 ExitCare Patient Information 2015 [employer]. This information is not intended to replace advice given to you by your health care provider. Make sure you discuss any questions you have with your health care provider.  

## 2015-01-27 NOTE — Care Management Note (Signed)
Case Management Note  Patient Details  Name: [name] MRN: [id number] Date of Birth: 04/13/1961  Subjective/Objective:         CM following for progression and d/c planning.            Action/Plan: 01/27/2015 Met with pt re followup care and medication needs. As pt has Medicare, CM is unable to provide medication assistance. This pt states that he get his meds at [name], however pt CSW this pt often does not keep his appointments. Pt also has a cousin who he states will help with meds if needed. This Cousin has contacted the CSW, [name], and states that she has helped with meds in the past and has provided a place for this pt to stay. She indicated that she could continue to do this, however it is unlikely that the pt will stay in the room she has provided. This pt states that he has had Medicaid in the past and he has been advised to reapply for this program to assist with medications. Pt has asked about placement in a SNF or ALF however does not have a skilled need for SNF nor a payor source for ALF. He states that he will return to his cousins at the time of d/c. Followup appointment has been arranged with [facility] and explained with pt.   Expected Discharge Date:     01/27/2015             Expected Discharge Plan:  Home/Self Care  In-House Referral:  Clinical Social Work  Discharge planning Services  CM Consult, [facility]  Post Acute Care Choice:    Choice offered to:     DME Arranged:    DME Agency:     HH Arranged:    HH Agency:     Status of Service:   Complete  Medicare Important Message Given:    Date Medicare IM Given:    Medicare IM give by:    Date Additional Medicare IM Given:    Additional Medicare Important Message give by:     If discussed at Long Length of Stay Meetings, dates discussed:    Additional Comments:  ,  U, RN 01/27/2015, 11:48 AM  

## 2015-01-27 NOTE — Progress Notes (Signed)
01/27/2015 1:00 PM  Discharge orders from Dr. Catha Gosselin received.  AVS, discharge instructions, and medications reviewed with patient.  Pt asked questions appropriately and all questions were answered with patient verbalizing understanding.  Pt instructed on his follow up appointments and stated that he would be at his appointment.  Prescriptions were given to patient.  Buss pass obtained from SW and given to patient.  Pt gathered his belongings and was escorted down to the ED by [facility] Chiropodist.   Theadora Rama

## 2015-01-28 LAB — URINE CULTURE

## 2015-01-30 ENCOUNTER — Encounter (HOSPITAL_COMMUNITY): Payer: Self-pay

## 2015-01-30 ENCOUNTER — Emergency Department (HOSPITAL_COMMUNITY)
Admission: EM | Admit: 2015-01-30 | Discharge: 2015-01-30 | Disposition: A | Discharge status: Home / Self Care | Payer: Medicare Other | Attending: Emergency Medicine | Admitting: Emergency Medicine

## 2015-01-30 ENCOUNTER — Emergency Department (HOSPITAL_COMMUNITY): Payer: Medicare Other

## 2015-01-30 DIAGNOSIS — I509 Heart failure, unspecified: Secondary | ICD-10-CM | POA: Insufficient documentation

## 2015-01-30 DIAGNOSIS — R1084 Generalized abdominal pain: Secondary | ICD-10-CM | POA: Insufficient documentation

## 2015-01-30 DIAGNOSIS — Z8619 Personal history of other infectious and parasitic diseases: Secondary | ICD-10-CM | POA: Insufficient documentation

## 2015-01-30 DIAGNOSIS — I10 Essential (primary) hypertension: Secondary | ICD-10-CM | POA: Insufficient documentation

## 2015-01-30 DIAGNOSIS — E119 Type 2 diabetes mellitus without complications: Secondary | ICD-10-CM | POA: Insufficient documentation

## 2015-01-30 DIAGNOSIS — J029 Acute pharyngitis, unspecified: Secondary | ICD-10-CM | POA: Insufficient documentation

## 2015-01-30 DIAGNOSIS — Z720 Tobacco use: Secondary | ICD-10-CM | POA: Insufficient documentation

## 2015-01-30 DIAGNOSIS — Z590 Homelessness: Secondary | ICD-10-CM | POA: Insufficient documentation

## 2015-01-30 DIAGNOSIS — G8929 Other chronic pain: Secondary | ICD-10-CM | POA: Insufficient documentation

## 2015-01-30 DIAGNOSIS — F209 Schizophrenia, unspecified: Secondary | ICD-10-CM | POA: Insufficient documentation

## 2015-01-30 DIAGNOSIS — Z79899 Other long term (current) drug therapy: Secondary | ICD-10-CM | POA: Insufficient documentation

## 2015-01-30 LAB — I-STAT CHEM 8, ED
BUN: 16 mg/dL (ref 6–20)
Calcium, Ion: 1.24 mmol/L — ABNORMAL HIGH (ref 1.12–1.23)
Chloride: 105 mmol/L (ref 101–111)
Creatinine, Ser: 0.9 mg/dL (ref 0.61–1.24)
GLUCOSE: 214 mg/dL — AB (ref 65–99)
HCT: 40 % (ref 39.0–52.0)
HEMOGLOBIN: 13.6 g/dL (ref 13.0–17.0)
POTASSIUM: 2.9 mmol/L — AB (ref 3.5–5.1)
SODIUM: 143 mmol/L (ref 135–145)
TCO2: 22 mmol/L (ref 0–100)

## 2015-01-30 MED ORDER — POTASSIUM CHLORIDE CRYS ER 20 MEQ PO TBCR
40.0000 meq | EXTENDED_RELEASE_TABLET | Freq: Once | ORAL | Status: AC
  Administered 2015-01-30: 40 meq via ORAL
  Filled 2015-01-30: qty 2

## 2015-01-30 MED ORDER — ACETAMINOPHEN 500 MG PO TABS
1000.0000 mg | ORAL_TABLET | Freq: Once | ORAL | Status: AC
  Administered 2015-01-30: 1000 mg via ORAL
  Filled 2015-01-30: qty 2

## 2015-01-30 NOTE — Discharge Instructions (Signed)

## 2015-01-30 NOTE — ED Provider Notes (Signed)
CSN: [id number]     Arrival date & time 01/30/15  0259 History  This chart was scribed for Mirian Mo, MD by Doreatha Martin, ED Scribe. This patient was seen in room A08C/A08C and the patient's care was started at 3:33 AM.     Chief Complaint  Patient presents with  . Abdominal Pain   Patient is a 54 y.o. male presenting with abdominal pain. The history is provided by the patient. No language interpreter was used.  Abdominal Pain Pain location:  Generalized Pain quality comment:  Raw Pain radiates to:  Does not radiate Pain severity:  Moderate Timing:  Unable to specify Progression:  Unable to specify Chronicity:  New Relieved by:  None tried Worsened by:  Nothing tried Ineffective treatments:  None tried Associated symptoms: sore throat   Associated symptoms: no cough   Risk factors: recent hospitalization   Risk factors comment:  Drug abuse   HPI Comments: [name] is a 54 y.o. male with Hx of DM, HTN, schizophrenia, CHF, neuropathy, polysubstance abuse, cocaine abuse, homelessness, Hepatitis C who presents to the Emergency Department complaining of moderate abdominal pain described as raw and hunger onset today. He states associated sore throat. He states a prescription for Tylenol and antibiotics that he has not filled yet due to misplacing it. Pt has a care plan and has had multiple visits to the ED for SI and schizophrenia, including on 01/16/15 and 01/25/15. Pt was admitted to the hospital on 01/26/15 with a Dx of renal failure. He was discharged with instructions to follow up with [facility] for a CBC and BMP. Pt states his last cocaine use was earlier today. He denies cough.   Past Medical History  Diagnosis Date  . Diabetes mellitus without complication   . Hypertension   . Schizophrenia   . CHF (congestive heart failure)   . Neuropathy   . Polysubstance abuse   . Cocaine abuse   . Homelessness   . Hepatitis C   . Chronic foot pain   .  Homelessness    No past surgical history on file. Family History  Problem Relation Age of Onset  . Hypertension Other   . Diabetes Other    History  Substance Use Topics  . Smoking status: Current Every Day Smoker -- 1.00 packs/day for 20 years    Types: Cigarettes  . Smokeless tobacco: Current User  . Alcohol Use: Yes     Comment: Pt denies    Review of Systems  HENT: Positive for sore throat.   Respiratory: Negative for cough.   Gastrointestinal: Positive for abdominal pain.  All other systems reviewed and are negative.  Allergies  Haldol  Home Medications   Prior to Admission medications   Medication Sig Start Date End Date Taking? Authorizing Provider  ARIPiprazole (ABILIFY) 5 MG tablet Take 1 tablet (5 mg total) by mouth 2 (two) times daily. 01/27/15   Maryann Mikhail, DO  cloNIDine (CATAPRES) 0.1 MG tablet Take 1 tablet (0.1 mg total) by mouth 2 (two) times daily. 01/27/15   Maryann Mikhail, DO  levofloxacin (LEVAQUIN) 750 MG tablet Take 1 tablet (750 mg total) by mouth daily. 01/27/15   Maryann Mikhail, DO   BP 180/111 mmHg  Pulse 85  Temp(Src) 98.5 F (36.9 C) (Oral)  Resp 22  SpO2 100% Physical Exam  Constitutional: He is oriented to person, place, and time. He appears well-developed and well-nourished.  HENT:  Head: Normocephalic and atraumatic.  Eyes: Conjunctivae and  EOM are normal.  Neck: Normal range of motion. Neck supple.  Cardiovascular: Normal rate, regular rhythm and normal heart sounds.   Pulmonary/Chest: Effort normal and breath sounds normal. No respiratory distress.  Abdominal: He exhibits no distension. There is no tenderness. There is no rebound and no guarding.  Musculoskeletal: Normal range of motion.       Cervical back: Normal.       Thoracic back: Normal.  Neurological: He is alert and oriented to person, place, and time.  Skin: Skin is warm and dry.  Vitals reviewed.   ED Course  Procedures (including critical care  time) DIAGNOSTIC STUDIES: Oxygen Saturation is 100% on RA, normal by my interpretation.    COORDINATION OF CARE: 3:39 AM Discussed treatment plan with pt at bedside and pt agreed to plan.   Labs Review Labs Reviewed  I-STAT CHEM 8, ED - Abnormal; Notable for the following:    Potassium 2.9 (*)    Glucose, Bld 214 (*)    Calcium, Ion 1.24 (*)    All other components within normal limits    Imaging Review Dg Chest 2 View  01/30/2015   CLINICAL DATA:  Chest pain and fever.  Dyspnea.  EXAM: CHEST  2 VIEW  COMPARISON:  01/27/2015  FINDINGS: The heart size and mediastinal contours are within normal limits. Both lungs are clear. The visualized skeletal structures are unremarkable.  IMPRESSION: No active cardiopulmonary disease.   Electronically Signed   By: Ellery Plunk M.D.   On: 01/30/2015 04:20     EKG Interpretation None      MDM   Final diagnoses:  Generalized abdominal pain    54 y.o. male with pertinent PMH of schizophrenia, polysubstance abuse presents with chronic abdominal pain. The patient denies current drug use. He states that he always has abdominal pain, states it is mildly worse in the setting of it raining outside. On arrival vital signs and physical exam as above. Patient has no abdominal tenderness. He was recently admitted for acute kidney injury and questionable pneumonia. He denies cough, fever. He states he did not receive anti-biotics prior to discharge.  Workup today with normal kidney function, no signs of pneumonia on chest x-ray. Given prior equivocal read suspect that this was a spurious value. He was mildly hypokalemic. By mouth potassium given here. Discharged home in stable condition..    I have reviewed all laboratory and imaging studies if ordered as above  1. Generalized abdominal pain          Mirian Mo, MD 01/30/15 [phone]

## 2015-01-30 NOTE — ED Notes (Signed)
See triage and edps notes he saw before myself

## 2015-01-30 NOTE — ED Notes (Signed)
Pt reports upper abdominal pain, hx of chronic abdominal pain. Upon arrival, pt requested to wait in waiting room. No acute distress, ambulatory with steady gait.

## 2015-01-31 ENCOUNTER — Emergency Department (HOSPITAL_COMMUNITY)
Admission: EM | Admit: 2015-01-31 | Discharge: 2015-01-31 | Disposition: A | Discharge status: Home / Self Care | Payer: Medicare Other | Attending: Emergency Medicine | Admitting: Emergency Medicine

## 2015-01-31 ENCOUNTER — Encounter (HOSPITAL_COMMUNITY): Payer: Self-pay | Admitting: Emergency Medicine

## 2015-01-31 DIAGNOSIS — Z79899 Other long term (current) drug therapy: Secondary | ICD-10-CM | POA: Insufficient documentation

## 2015-01-31 DIAGNOSIS — I509 Heart failure, unspecified: Secondary | ICD-10-CM | POA: Insufficient documentation

## 2015-01-31 DIAGNOSIS — G8929 Other chronic pain: Secondary | ICD-10-CM | POA: Insufficient documentation

## 2015-01-31 DIAGNOSIS — M79672 Pain in left foot: Secondary | ICD-10-CM | POA: Insufficient documentation

## 2015-01-31 DIAGNOSIS — Z720 Tobacco use: Secondary | ICD-10-CM | POA: Insufficient documentation

## 2015-01-31 DIAGNOSIS — M79671 Pain in right foot: Secondary | ICD-10-CM

## 2015-01-31 DIAGNOSIS — F200 Paranoid schizophrenia: Secondary | ICD-10-CM

## 2015-01-31 DIAGNOSIS — I10 Essential (primary) hypertension: Secondary | ICD-10-CM | POA: Insufficient documentation

## 2015-01-31 DIAGNOSIS — Z590 Homelessness: Secondary | ICD-10-CM | POA: Insufficient documentation

## 2015-01-31 DIAGNOSIS — J029 Acute pharyngitis, unspecified: Secondary | ICD-10-CM | POA: Insufficient documentation

## 2015-01-31 DIAGNOSIS — Z8619 Personal history of other infectious and parasitic diseases: Secondary | ICD-10-CM | POA: Insufficient documentation

## 2015-01-31 DIAGNOSIS — F29 Unspecified psychosis not due to a substance or known physiological condition: Secondary | ICD-10-CM | POA: Insufficient documentation

## 2015-01-31 DIAGNOSIS — E119 Type 2 diabetes mellitus without complications: Secondary | ICD-10-CM | POA: Insufficient documentation

## 2015-01-31 MED ORDER — CLONIDINE HCL 0.1 MG PO TABS
0.1000 mg | ORAL_TABLET | Freq: Once | ORAL | Status: AC
  Administered 2015-01-31: 0.1 mg via ORAL
  Filled 2015-01-31: qty 1

## 2015-01-31 MED ORDER — ACETAMINOPHEN 325 MG PO TABS
325.0000 mg | ORAL_TABLET | Freq: Once | ORAL | Status: AC
  Administered 2015-01-31: 325 mg via ORAL
  Filled 2015-01-31: qty 1

## 2015-01-31 MED ORDER — MENTHOL 3 MG MT LOZG
1.0000 | LOZENGE | OROMUCOSAL | Status: DC | PRN
  Filled 2015-01-31: qty 9

## 2015-01-31 NOTE — Discharge Instructions (Signed)

## 2015-01-31 NOTE — ED Provider Notes (Addendum)
CSN: [id number]     Arrival date & time 01/31/15  0353 History   First MD Initiated Contact with Patient 01/31/15 0406     Chief Complaint  Patient presents with  . Foot Pain     (Consider location/radiation/quality/duration/timing/severity/associated sxs/prior Treatment) HPI 54 year old male presents to emergency department via EMS with complaint of bilateral feet pain.  Patient also reports to me that he swallowed a snake while he was at a liquor store earlier today and is having a sore throat, do it.  He reports that he was able to get the snake out of his mouth.  He denies any difficulties breathing or swallowing.  Patient reports that his feet are feeling better after soaking them, and requests Tylenol, soda and [place] sandwich. Past Medical History  Diagnosis Date  . Diabetes mellitus without complication   . Hypertension   . Schizophrenia   . CHF (congestive heart failure)   . Neuropathy   . Polysubstance abuse   . Cocaine abuse   . Homelessness   . Hepatitis C   . Chronic foot pain   . Homelessness    History reviewed. No pertinent past surgical history. Family History  Problem Relation Age of Onset  . Hypertension Other   . Diabetes Other    History  Substance Use Topics  . Smoking status: Current Every Day Smoker -- 1.00 packs/day for 20 years    Types: Cigarettes  . Smokeless tobacco: Current User  . Alcohol Use: Yes    Review of Systems   Level V caveat, psychiatric disorder Allergies  Haldol  Home Medications   Prior to Admission medications   Medication Sig Start Date End Date Taking? Authorizing Provider  ARIPiprazole (ABILIFY) 5 MG tablet Take 1 tablet (5 mg total) by mouth 2 (two) times daily. 01/27/15   Maryann Mikhail, DO  cloNIDine (CATAPRES) 0.1 MG tablet Take 1 tablet (0.1 mg total) by mouth 2 (two) times daily. 01/27/15   Maryann Mikhail, DO  levofloxacin (LEVAQUIN) 750 MG tablet Take 1 tablet (750 mg total) by mouth daily. 01/27/15   Maryann  Mikhail, DO   BP 176/95 mmHg  Pulse 78  Temp(Src) 98.1 F (36.7 C) (Oral)  Resp 19  Ht  (1.753 m)  Wt 170 lb (77.111 kg)  BMI 25.09 kg/m2  SpO2 97% Physical Exam  Constitutional: He appears well-developed and well-nourished.  HENT:  Head: Normocephalic and atraumatic.  Nose: Nose normal.  Mouth/Throat: Oropharynx is clear and moist.  Eyes: Conjunctivae and EOM are normal. Pupils are equal, round, and reactive to light.  Neck: Normal range of motion. Neck supple. No JVD present. No tracheal deviation present. No thyromegaly present.  Patient has mild lymphadenopathy to the left side anterior cervical chain  Cardiovascular: Normal rate, regular rhythm, normal heart sounds and intact distal pulses.  Exam reveals no gallop and no friction rub.   No murmur heard. Pulmonary/Chest: Effort normal and breath sounds normal. No stridor. No respiratory distress. He has no wheezes. He has no rales. He exhibits no tenderness.  Abdominal: Soft. Bowel sounds are normal. He exhibits no distension and no mass. There is no tenderness. There is no rebound and no guarding.  Musculoskeletal: Normal range of motion. He exhibits no edema or tenderness.  Lymphadenopathy:    He has no cervical adenopathy.  Neurological: He is alert. He displays normal reflexes. He exhibits normal muscle tone. Coordination normal.  Skin: Skin is warm and dry. No rash noted. No erythema. No pallor.  Psychiatric:  Delusions, psychosis.  At baseline  Nursing note and vitals reviewed.   ED Course  Procedures (including critical care time) Labs Review Labs Reviewed - No data to display  Imaging Review Dg Chest 2 View  01/30/2015   CLINICAL DATA:  Chest pain and fever.  Dyspnea.  EXAM: CHEST  2 VIEW  COMPARISON:  01/27/2015  FINDINGS: The heart size and mediastinal contours are within normal limits. Both lungs are clear. The visualized skeletal structures are unremarkable.  IMPRESSION: No active cardiopulmonary disease.    Electronically Signed   By: Ellery Plunk M.D.   On: 01/30/2015 04:20     EKG Interpretation None      MDM   Final diagnoses:  Schizophrenia, paranoid type  Foot pain, bilateral    54 year old male history of schizophrenia presents with bilateral foot pain which she has frequently and sore throat which she attributes to swelling.  The snake.  Patient was seen yesterday with sore throat.  No signs of erythema or exudate.  Will treat with Chloraseptic drops, Tylenol.    Marisa Severin, MD 01/31/15 1610  Marisa Severin, MD 01/31/15 [phone]

## 2015-01-31 NOTE — ED Notes (Signed)
Pt.is soaking his feet at this tme.

## 2015-01-31 NOTE — ED Notes (Signed)
Pt. arrived with EMS from street reports feet pain for several days , denies injury /ambulatory .

## 2015-02-01 ENCOUNTER — Encounter (HOSPITAL_COMMUNITY): Payer: Self-pay | Admitting: *Deleted

## 2015-02-01 ENCOUNTER — Emergency Department (HOSPITAL_COMMUNITY)
Admission: EM | Admit: 2015-02-01 | Discharge: 2015-02-01 | Disposition: A | Discharge status: Home / Self Care | Payer: Medicare Other | Attending: Emergency Medicine | Admitting: Emergency Medicine

## 2015-02-01 DIAGNOSIS — E119 Type 2 diabetes mellitus without complications: Secondary | ICD-10-CM | POA: Insufficient documentation

## 2015-02-01 DIAGNOSIS — J029 Acute pharyngitis, unspecified: Secondary | ICD-10-CM | POA: Insufficient documentation

## 2015-02-01 DIAGNOSIS — Z792 Long term (current) use of antibiotics: Secondary | ICD-10-CM | POA: Insufficient documentation

## 2015-02-01 DIAGNOSIS — G8929 Other chronic pain: Secondary | ICD-10-CM | POA: Insufficient documentation

## 2015-02-01 DIAGNOSIS — Z590 Homelessness: Secondary | ICD-10-CM | POA: Insufficient documentation

## 2015-02-01 DIAGNOSIS — R509 Fever, unspecified: Secondary | ICD-10-CM | POA: Insufficient documentation

## 2015-02-01 DIAGNOSIS — I10 Essential (primary) hypertension: Secondary | ICD-10-CM | POA: Insufficient documentation

## 2015-02-01 DIAGNOSIS — F209 Schizophrenia, unspecified: Secondary | ICD-10-CM | POA: Insufficient documentation

## 2015-02-01 DIAGNOSIS — Z720 Tobacco use: Secondary | ICD-10-CM | POA: Insufficient documentation

## 2015-02-01 DIAGNOSIS — Z8619 Personal history of other infectious and parasitic diseases: Secondary | ICD-10-CM | POA: Insufficient documentation

## 2015-02-01 DIAGNOSIS — I509 Heart failure, unspecified: Secondary | ICD-10-CM | POA: Insufficient documentation

## 2015-02-01 DIAGNOSIS — Z79899 Other long term (current) drug therapy: Secondary | ICD-10-CM | POA: Insufficient documentation

## 2015-02-01 LAB — CULTURE, BLOOD (ROUTINE X 2)
CULTURE: NO GROWTH
Culture: NO GROWTH

## 2015-02-01 MED ORDER — ACETAMINOPHEN 325 MG PO TABS
650.0000 mg | ORAL_TABLET | Freq: Once | ORAL | Status: AC
  Administered 2015-02-01: 650 mg via ORAL
  Filled 2015-02-01: qty 2

## 2015-02-01 NOTE — ED Notes (Signed)
Bed: [facility] Expected date:  Expected time:  Means of arrival:  Comments: 54 yo M  Pain all over  Triage

## 2015-02-01 NOTE — ED Notes (Signed)
Pt arrives to the ER via EMS for complaints of generalized body aches; pt states that he wants a place to sleep; pt comes to the ER for chronic pain; pt was seen at [facility] this am for the same thing

## 2015-02-01 NOTE — ED Provider Notes (Signed)
CSN: [id number]     Arrival date & time 02/01/15  0002 History  This chart was scribed for Benjiman Core, MD by Octavia Heir, ED Scribe. This patient was seen in room WHALB/WHALB and the patient's care was started at 3:08 AM.    Chief Complaint  Patient presents with  . Generalized Body Aches     The history is provided by the patient. No language interpreter was used.   HPI Comments: [name] is a 54 y.o. male who presents to the Emergency Department complaining of sudden onset gradual worsening upper body aches onset this evening. Pt reports having pain while swallowing and fevers. Pt was brought in by EMS and reported he wanted some place to sleep.  Past Medical History  Diagnosis Date  . Diabetes mellitus without complication   . Hypertension   . Schizophrenia   . CHF (congestive heart failure)   . Neuropathy   . Polysubstance abuse   . Cocaine abuse   . Homelessness   . Hepatitis C   . Chronic foot pain   . Homelessness    History reviewed. No pertinent past surgical history. Family History  Problem Relation Age of Onset  . Hypertension Other   . Diabetes Other    History  Substance Use Topics  . Smoking status: Current Every Day Smoker -- 1.00 packs/day for 20 years    Types: Cigarettes  . Smokeless tobacco: Current User  . Alcohol Use: Yes    Review of Systems  Constitutional: Positive for fever and chills.  All other systems reviewed and are negative.     Allergies  Haldol  Home Medications   Prior to Admission medications   Medication Sig Start Date End Date Taking? Authorizing Provider  acetaminophen (TYLENOL) 500 MG tablet Take 1,000 mg by mouth every 6 (six) hours as needed for mild pain.   Yes Historical Provider, MD  ARIPiprazole (ABILIFY) 5 MG tablet Take 1 tablet (5 mg total) by mouth 2 (two) times daily. 01/27/15  Yes Maryann Mikhail, DO  cloNIDine (CATAPRES) 0.1 MG tablet Take 1 tablet (0.1 mg total) by mouth 2 (two) times  daily. 01/27/15  Yes Maryann Mikhail, DO  levofloxacin (LEVAQUIN) 750 MG tablet Take 1 tablet (750 mg total) by mouth daily. 01/27/15   Maryann Mikhail, DO   Triage vitals: BP 152/88 mmHg  Pulse 51  Temp(Src) 98.6 F (37 C) (Oral)  Resp 14  SpO2 100%  Physical Exam  Neck:  Mild posterior pharynx redness Pain at sternum nosh Mild lower extremity pitting edema  Lymphadenopathy:    He has no cervical adenopathy.  Nursing note and vitals reviewed.   ED Course  Procedures   3:07 AM Discussed treatment plan with pt at bedside and pt agreed to plan.  Labs Review Labs Reviewed - No data to display  Imaging Review No results found.   EKG Interpretation None      MDM   Final diagnoses:  Sore throat    Patient presents with sore throat. Seen here for same yesterday. States he thinks he needs a Tylenol. Hurts with swallowing. Benign exam. Lab work and when checked 2 days ago and his previous renal sufficiency has resolved. Will discharge I personally performed the services described in this documentation, which was scribed in my presence. The recorded information has been reviewed and is accurate.    Benjiman Core, MD 02/01/15 [phone]

## 2015-02-01 NOTE — Discharge Instructions (Signed)
Sore Throat A sore throat is pain, burning, irritation, or scratchiness of the throat. There is often pain or tenderness when swallowing or talking. A sore throat may be accompanied by other symptoms, such as coughing, sneezing, fever, and swollen neck glands. A sore throat is often the first sign of another sickness, such as a cold, flu, strep throat, or mononucleosis (commonly known as mono). Most sore throats go away without medical treatment. CAUSES  The most common causes of a sore throat include:  A viral infection, such as a cold, flu, or mono.  A bacterial infection, such as strep throat, tonsillitis, or whooping cough.  Seasonal allergies.  Dryness in the air.  Irritants, such as smoke or pollution.  Gastroesophageal reflux disease (GERD). HOME CARE INSTRUCTIONS   Only take over-the-counter medicines as directed by your caregiver.  Drink enough fluids to keep your urine clear or pale yellow.  Rest as needed.  Try using throat sprays, lozenges, or sucking on hard candy to ease any pain (if older than 4 years or as directed).  Sip warm liquids, such as broth, herbal tea, or warm water with honey to relieve pain temporarily. You may also eat or drink cold or frozen liquids such as frozen ice pops.  Gargle with salt water (mix 1 tsp salt with 8 oz of water).  Do not smoke and avoid secondhand smoke.  Put a cool-mist humidifier in your bedroom at night to moisten the air. You can also turn on a hot shower and sit in the bathroom with the door closed for 5-10 minutes. SEEK IMMEDIATE MEDICAL CARE IF:  You have difficulty breathing.  You are unable to swallow fluids, soft foods, or your saliva.  You have increased swelling in the throat.  Your sore throat does not get better in 7 days.  You have nausea and vomiting.  You have a fever or persistent symptoms for more than 2-3 days.  You have a fever and your symptoms suddenly get worse. MAKE SURE YOU:   Understand  these instructions.  Will watch your condition.  Will get help right away if you are not doing well or get worse. Document Released: 07/25/2004 Document Revised: 06/03/2012 Document Reviewed: 02/23/2012 ExitCare Patient Information 2015 [employer]. This information is not intended to replace advice given to you by your health care provider. Make sure you discuss any questions you have with your health care provider.  

## 2015-02-01 NOTE — ED Notes (Signed)
Patient given tylenol. Patient demanding food. Patient was advised he will not be given food. Patient began to become verbally abusive to this nurse. Patient cursing, states "I ain't no woman beater, get the hell outta my face. Get me some damn footies." Patient given footies and asked again to put his shoes on, patient again began cursing at this nurse. Off duty and security to bedside. Patient ambulated to wheelchair and was taken to lobby by security.

## 2015-02-01 NOTE — ED Notes (Signed)
MD at bedside. 

## 2015-02-03 ENCOUNTER — Inpatient Hospital Stay: Payer: Self-pay | Admitting: Family Medicine

## 2015-02-08 IMAGING — DX DG CHEST 2V
2 series · 2 of 2 positions shown · non-contrast
Comparison: 07/15/2014

CLINICAL DATA: Chest pain abdomen pain dyspnea

EXAM:
CHEST  2 VIEW

[chest pa]
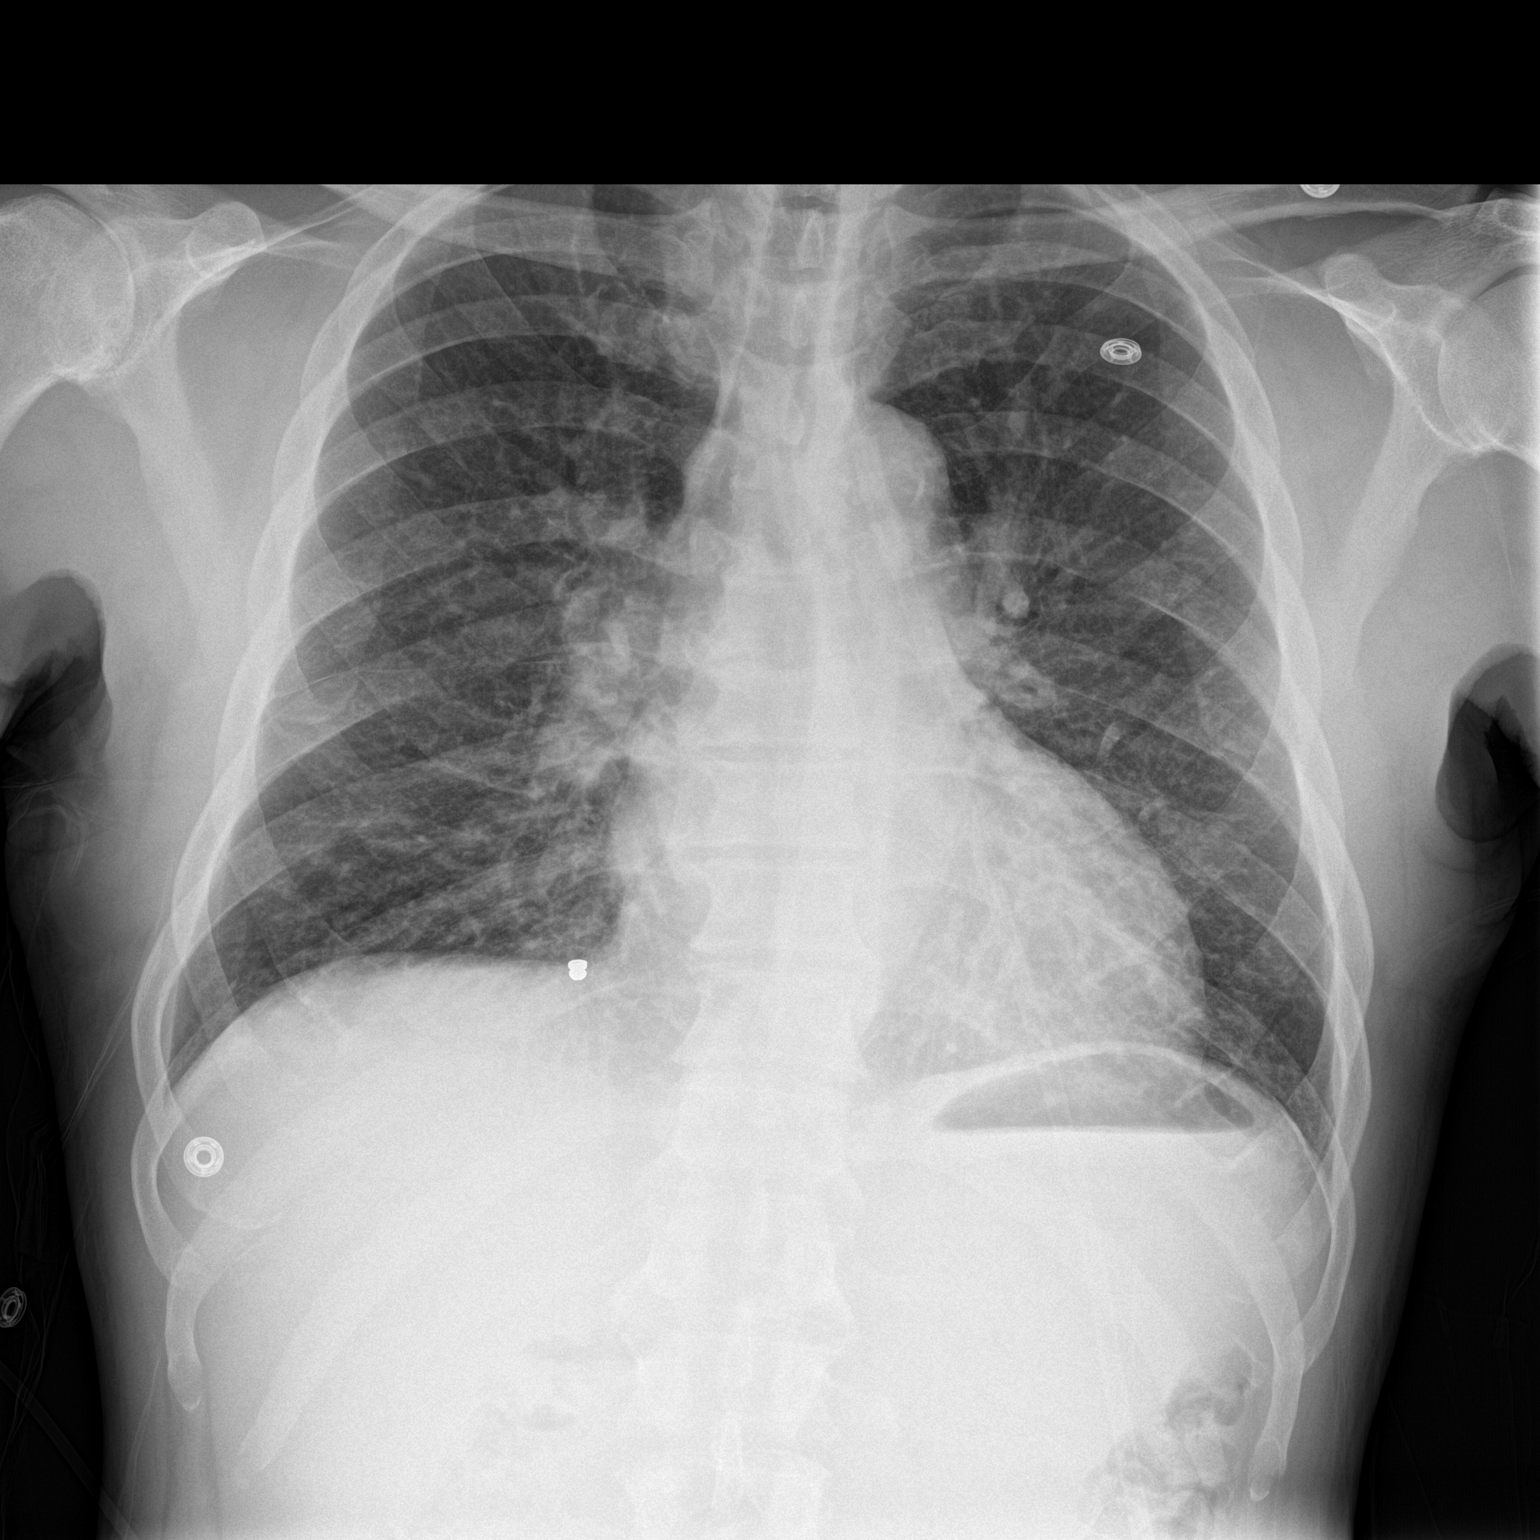

[chest lat]
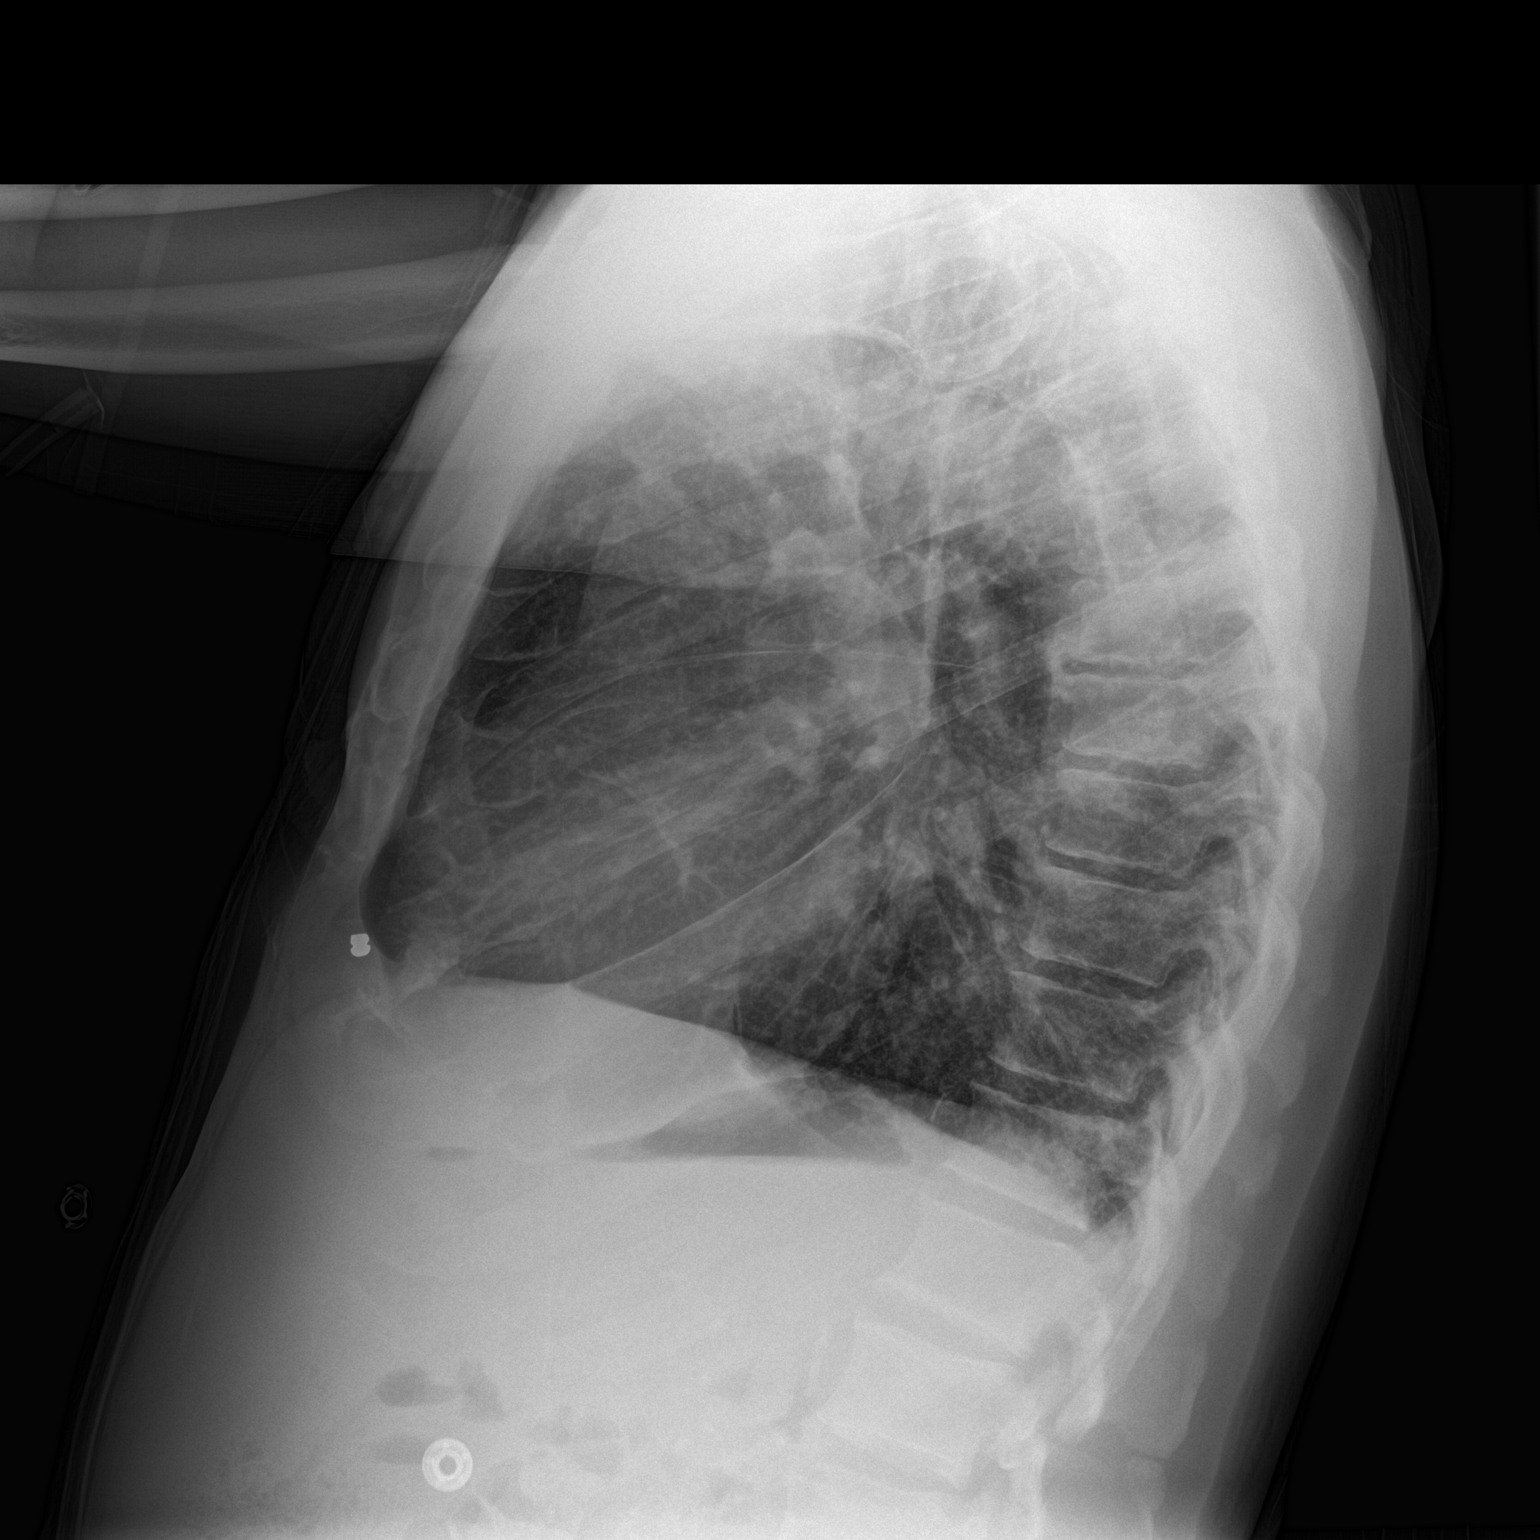

[2 of 2 positions shown; findings below may reference images not displayed]

FINDINGS: There is generalized interstitial coarsening which is worsened from
07/15/2014. This could represent interstitial infiltrates or
interstitial fluid. No airspace consolidation is evident. There are
no pleural effusions.
IMPRESSION: Interstitial fluid versus interstitial infiltrates, new/worsened
from 07/15/2014.

## 2015-02-08 IMAGING — DX DG ABDOMEN 2V
2 series · 2 of 2 positions shown · non-contrast
Comparison: 04/01/2014

CLINICAL DATA: Abdominal pain, chest pain, shortness of breath,
cough.

EXAM:
ABDOMEN - 2 VIEW

[abdomen erect]
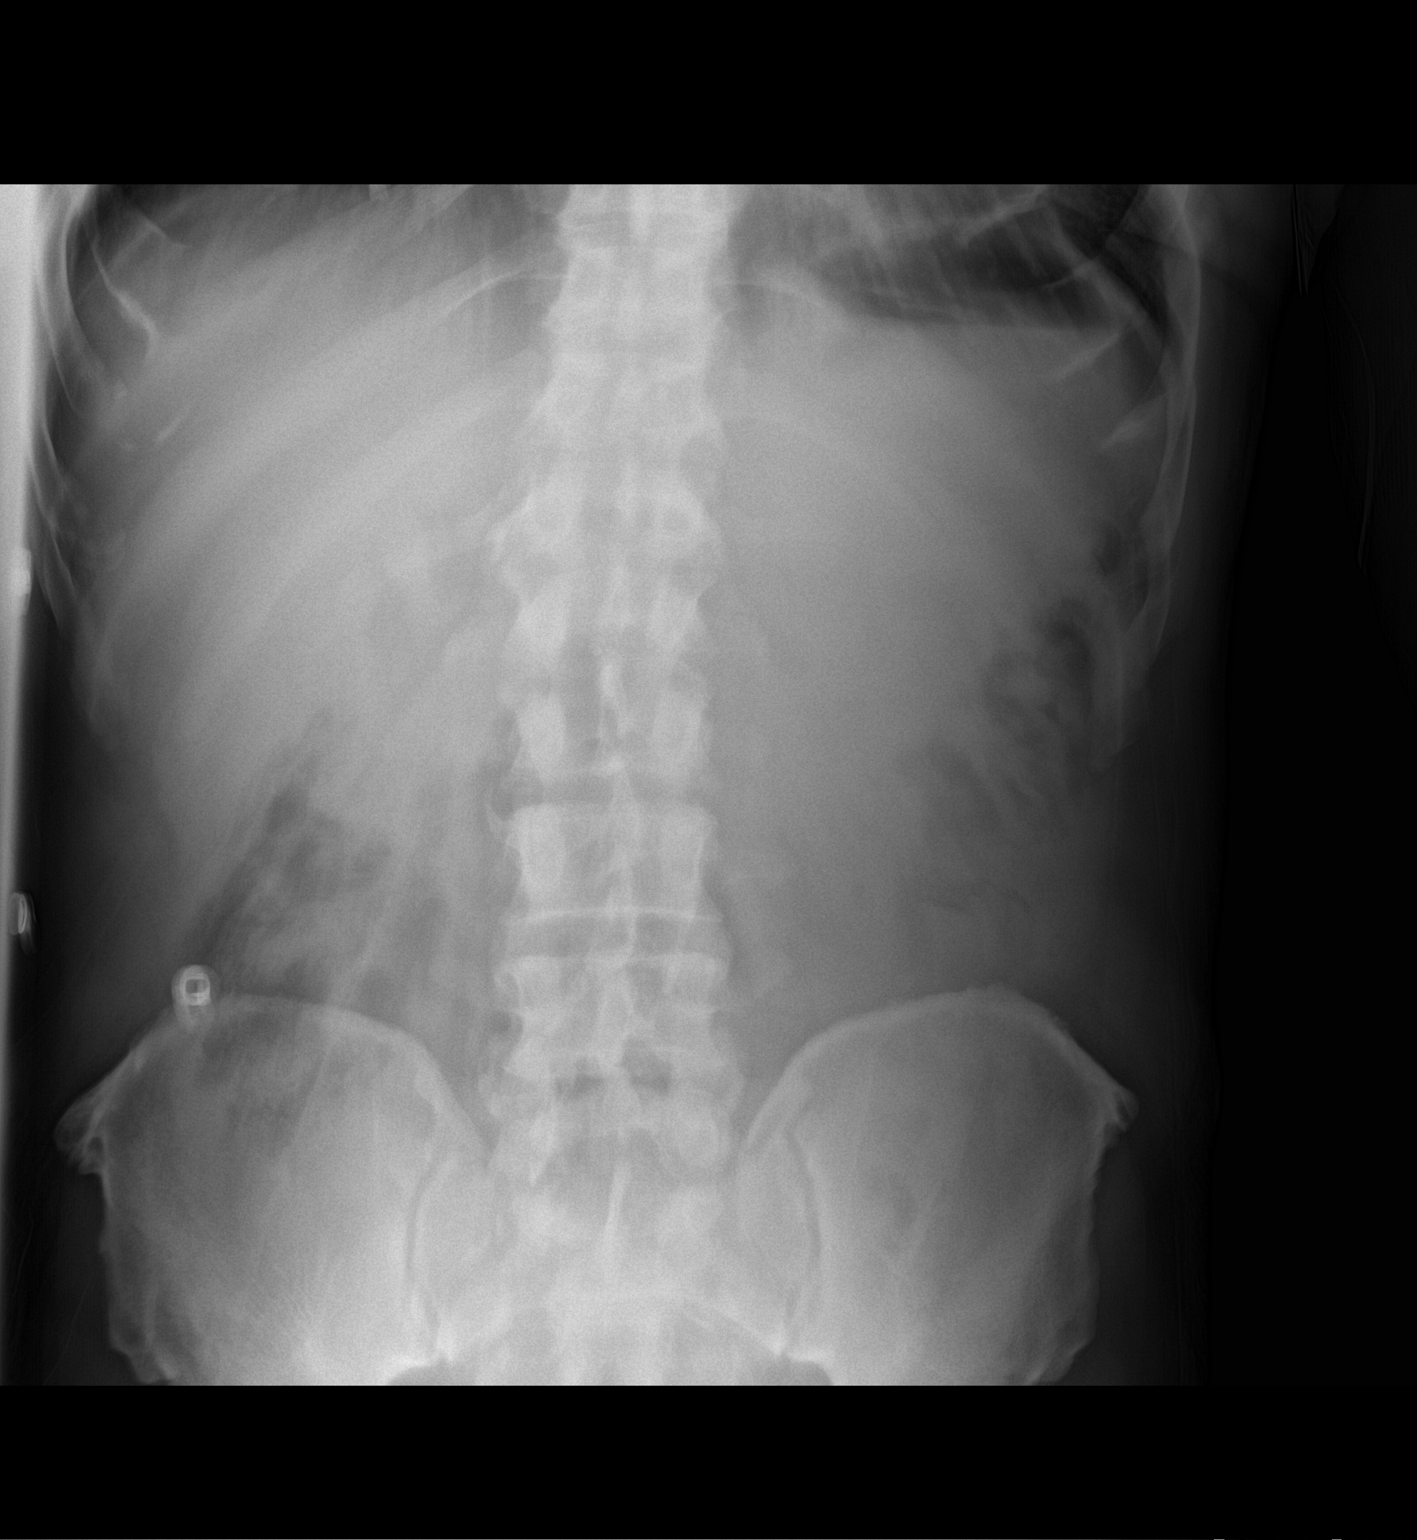

[abdomen supine]
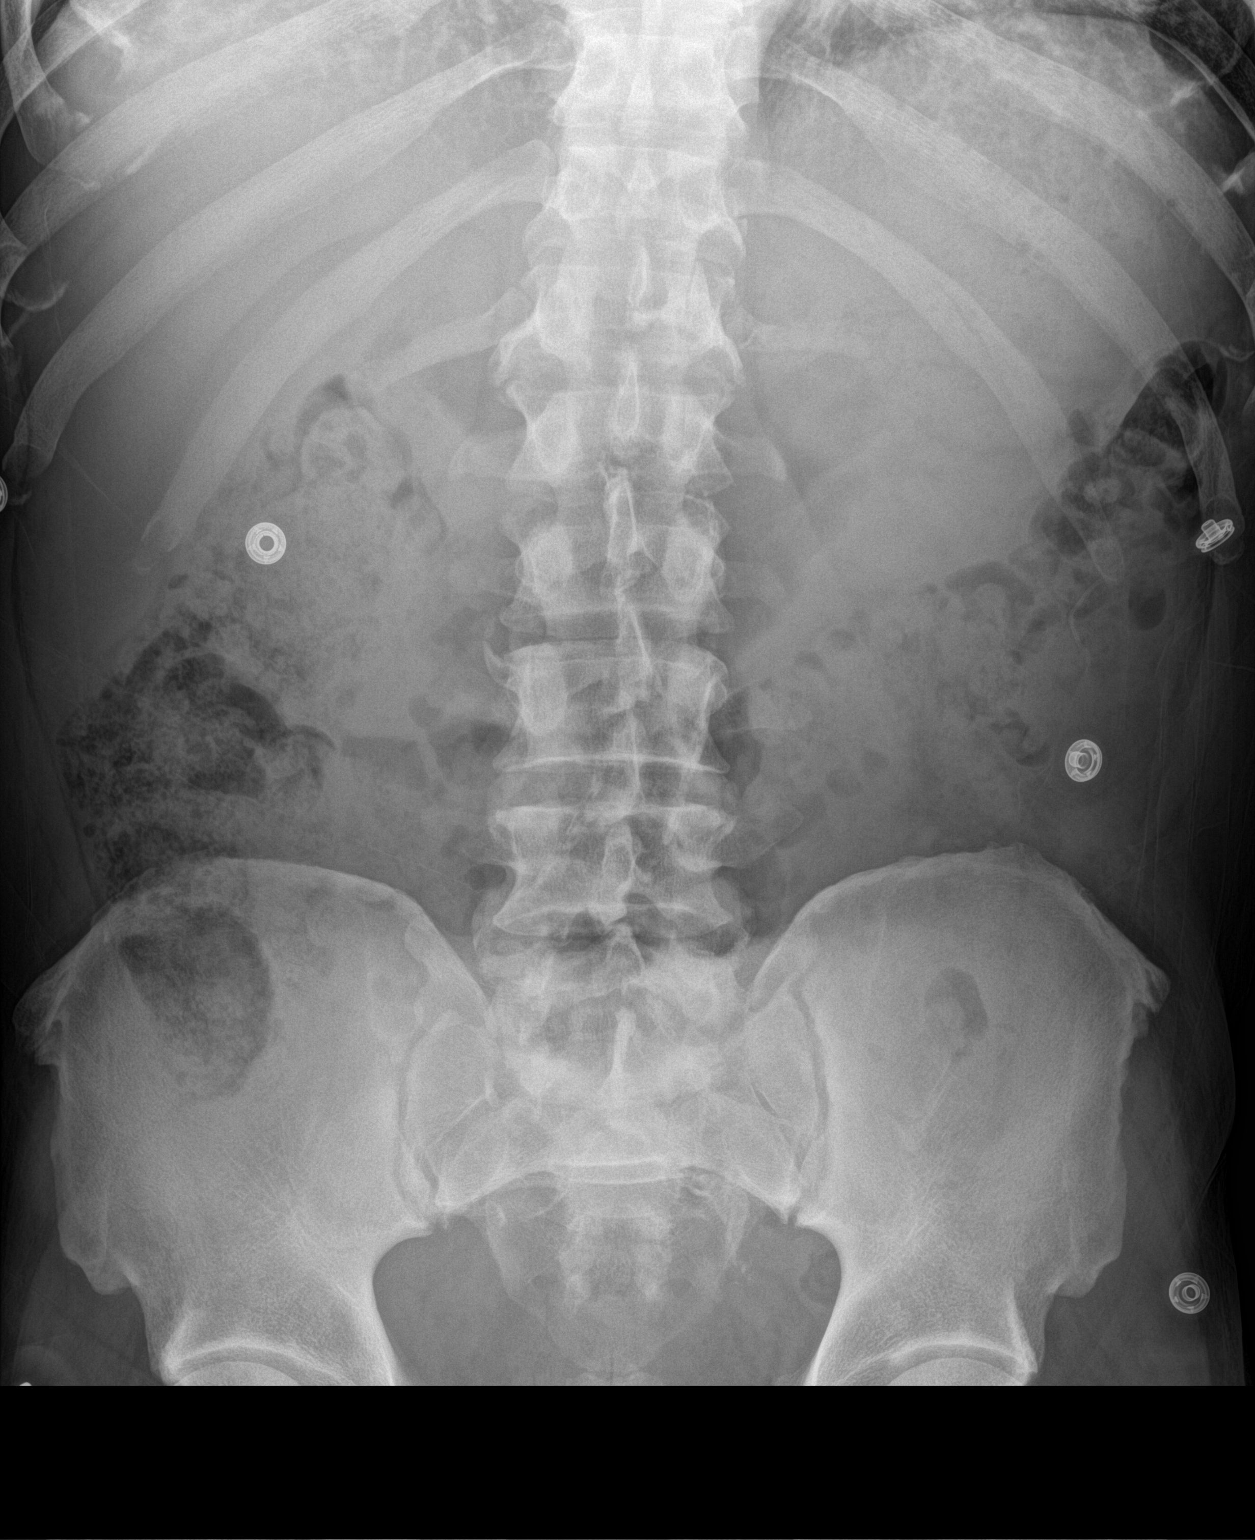

[2 of 2 positions shown; findings below may reference images not displayed]

FINDINGS: Upright view is limited by motion artifact. Diffusely stool-filled
colon. No small or large bowel distention. No free intra-abdominal
air. No abnormal air-fluid levels. No radiopaque stones.
Degenerative changes in the spine.
IMPRESSION: Nonobstructive bowel gas pattern with stool-filled colon.

## 2015-02-13 ENCOUNTER — Encounter (HOSPITAL_COMMUNITY): Payer: Self-pay

## 2015-02-13 ENCOUNTER — Emergency Department (HOSPITAL_COMMUNITY)
Admission: EM | Admit: 2015-02-13 | Discharge: 2015-02-13 | Disposition: A | Discharge status: Home / Self Care | Payer: Medicare Other | Attending: Emergency Medicine | Admitting: Emergency Medicine

## 2015-02-13 ENCOUNTER — Encounter (HOSPITAL_COMMUNITY): Payer: Self-pay | Admitting: Family Medicine

## 2015-02-13 DIAGNOSIS — F1499 Cocaine use, unspecified with unspecified cocaine-induced disorder: Secondary | ICD-10-CM | POA: Insufficient documentation

## 2015-02-13 DIAGNOSIS — I10 Essential (primary) hypertension: Secondary | ICD-10-CM | POA: Insufficient documentation

## 2015-02-13 DIAGNOSIS — G8929 Other chronic pain: Secondary | ICD-10-CM | POA: Insufficient documentation

## 2015-02-13 DIAGNOSIS — I509 Heart failure, unspecified: Secondary | ICD-10-CM | POA: Insufficient documentation

## 2015-02-13 DIAGNOSIS — F10129 Alcohol abuse with intoxication, unspecified: Secondary | ICD-10-CM

## 2015-02-13 DIAGNOSIS — Z720 Tobacco use: Secondary | ICD-10-CM | POA: Insufficient documentation

## 2015-02-13 DIAGNOSIS — Z79899 Other long term (current) drug therapy: Secondary | ICD-10-CM | POA: Insufficient documentation

## 2015-02-13 DIAGNOSIS — Z590 Homelessness: Secondary | ICD-10-CM | POA: Insufficient documentation

## 2015-02-13 DIAGNOSIS — E119 Type 2 diabetes mellitus without complications: Secondary | ICD-10-CM | POA: Insufficient documentation

## 2015-02-13 DIAGNOSIS — F200 Paranoid schizophrenia: Secondary | ICD-10-CM

## 2015-02-13 DIAGNOSIS — Z8619 Personal history of other infectious and parasitic diseases: Secondary | ICD-10-CM | POA: Insufficient documentation

## 2015-02-13 DIAGNOSIS — F1420 Cocaine dependence, uncomplicated: Secondary | ICD-10-CM

## 2015-02-13 IMAGING — CR DG CHEST 2V
2 series · 2 of 2 positions shown · non-contrast
Comparison: 07/28/2014

CLINICAL DATA: Cough.  Patient reports choking episode.

EXAM:
CHEST  2 VIEW

[w chest pa]
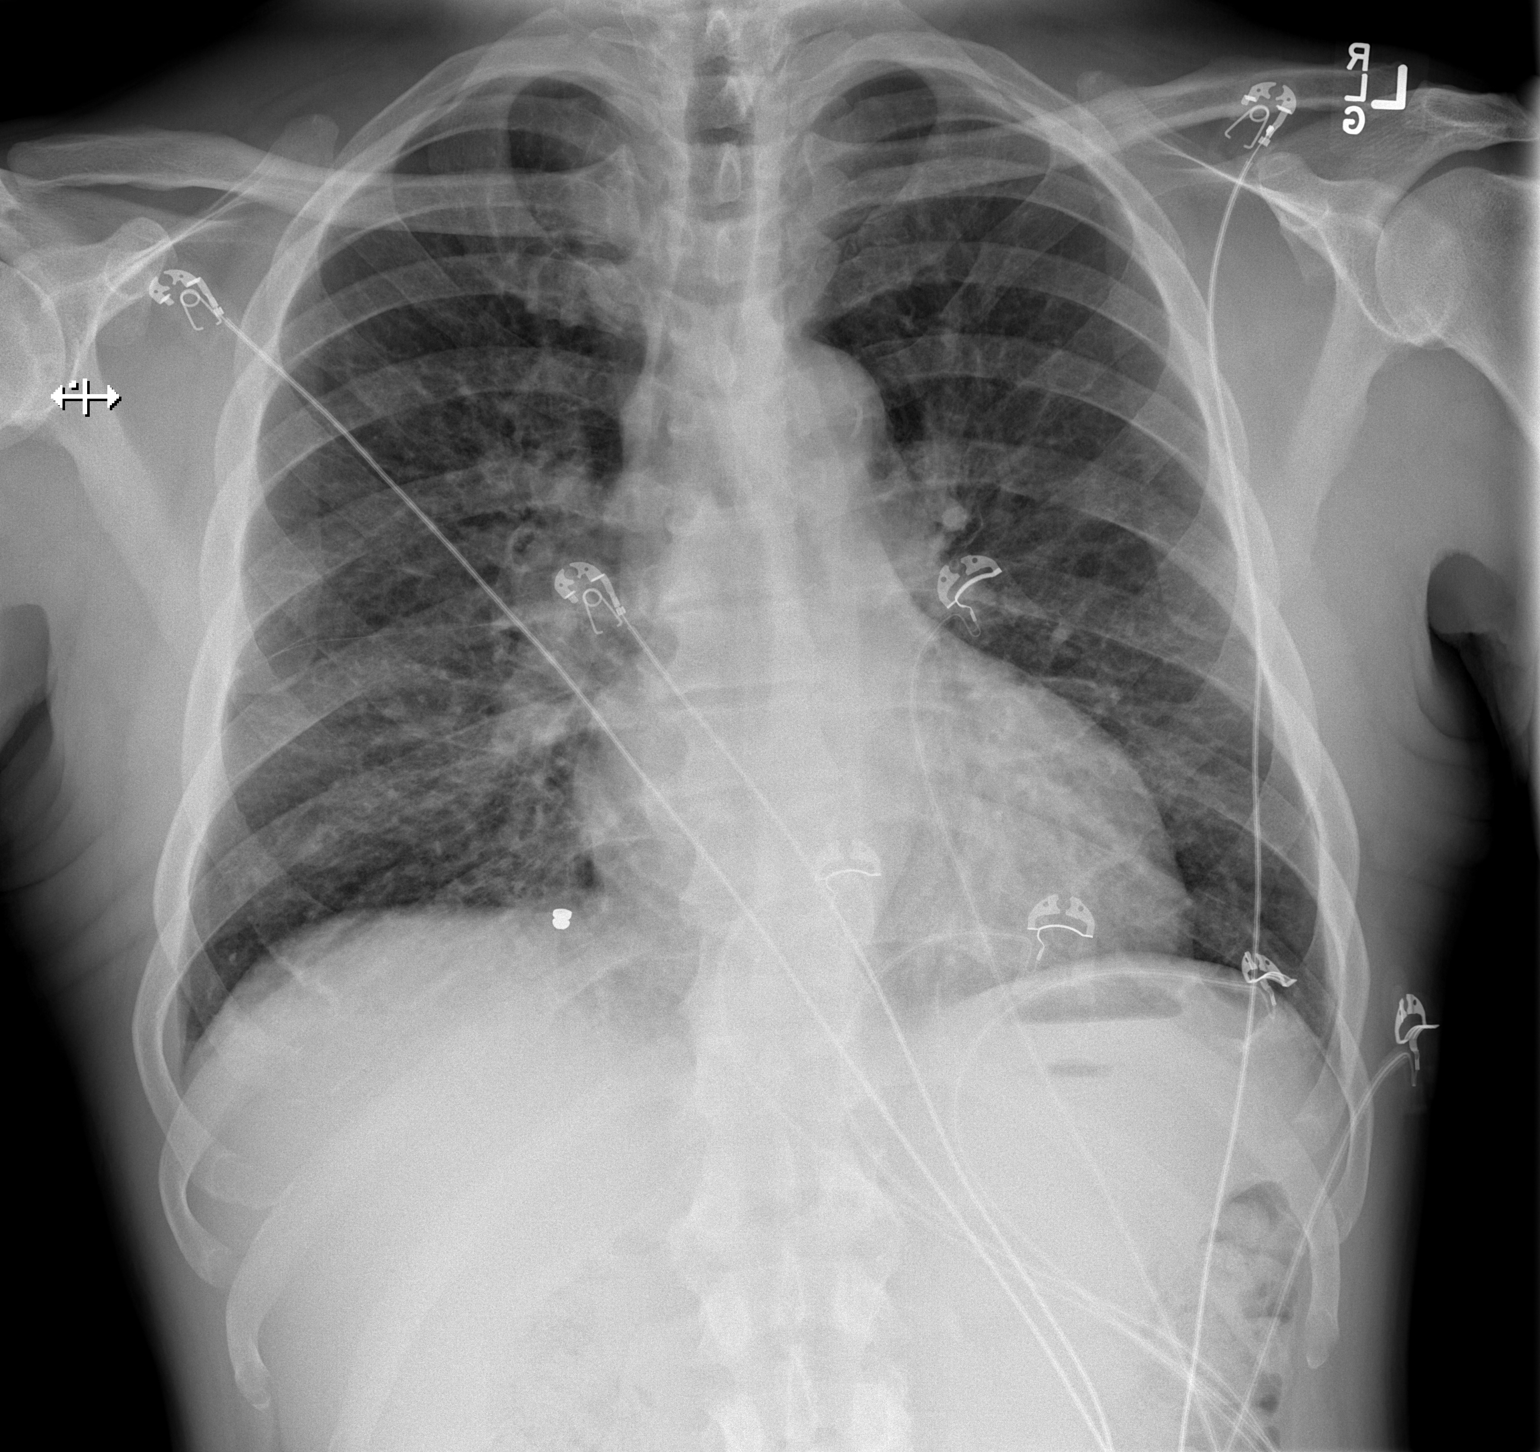

[w chest lat]
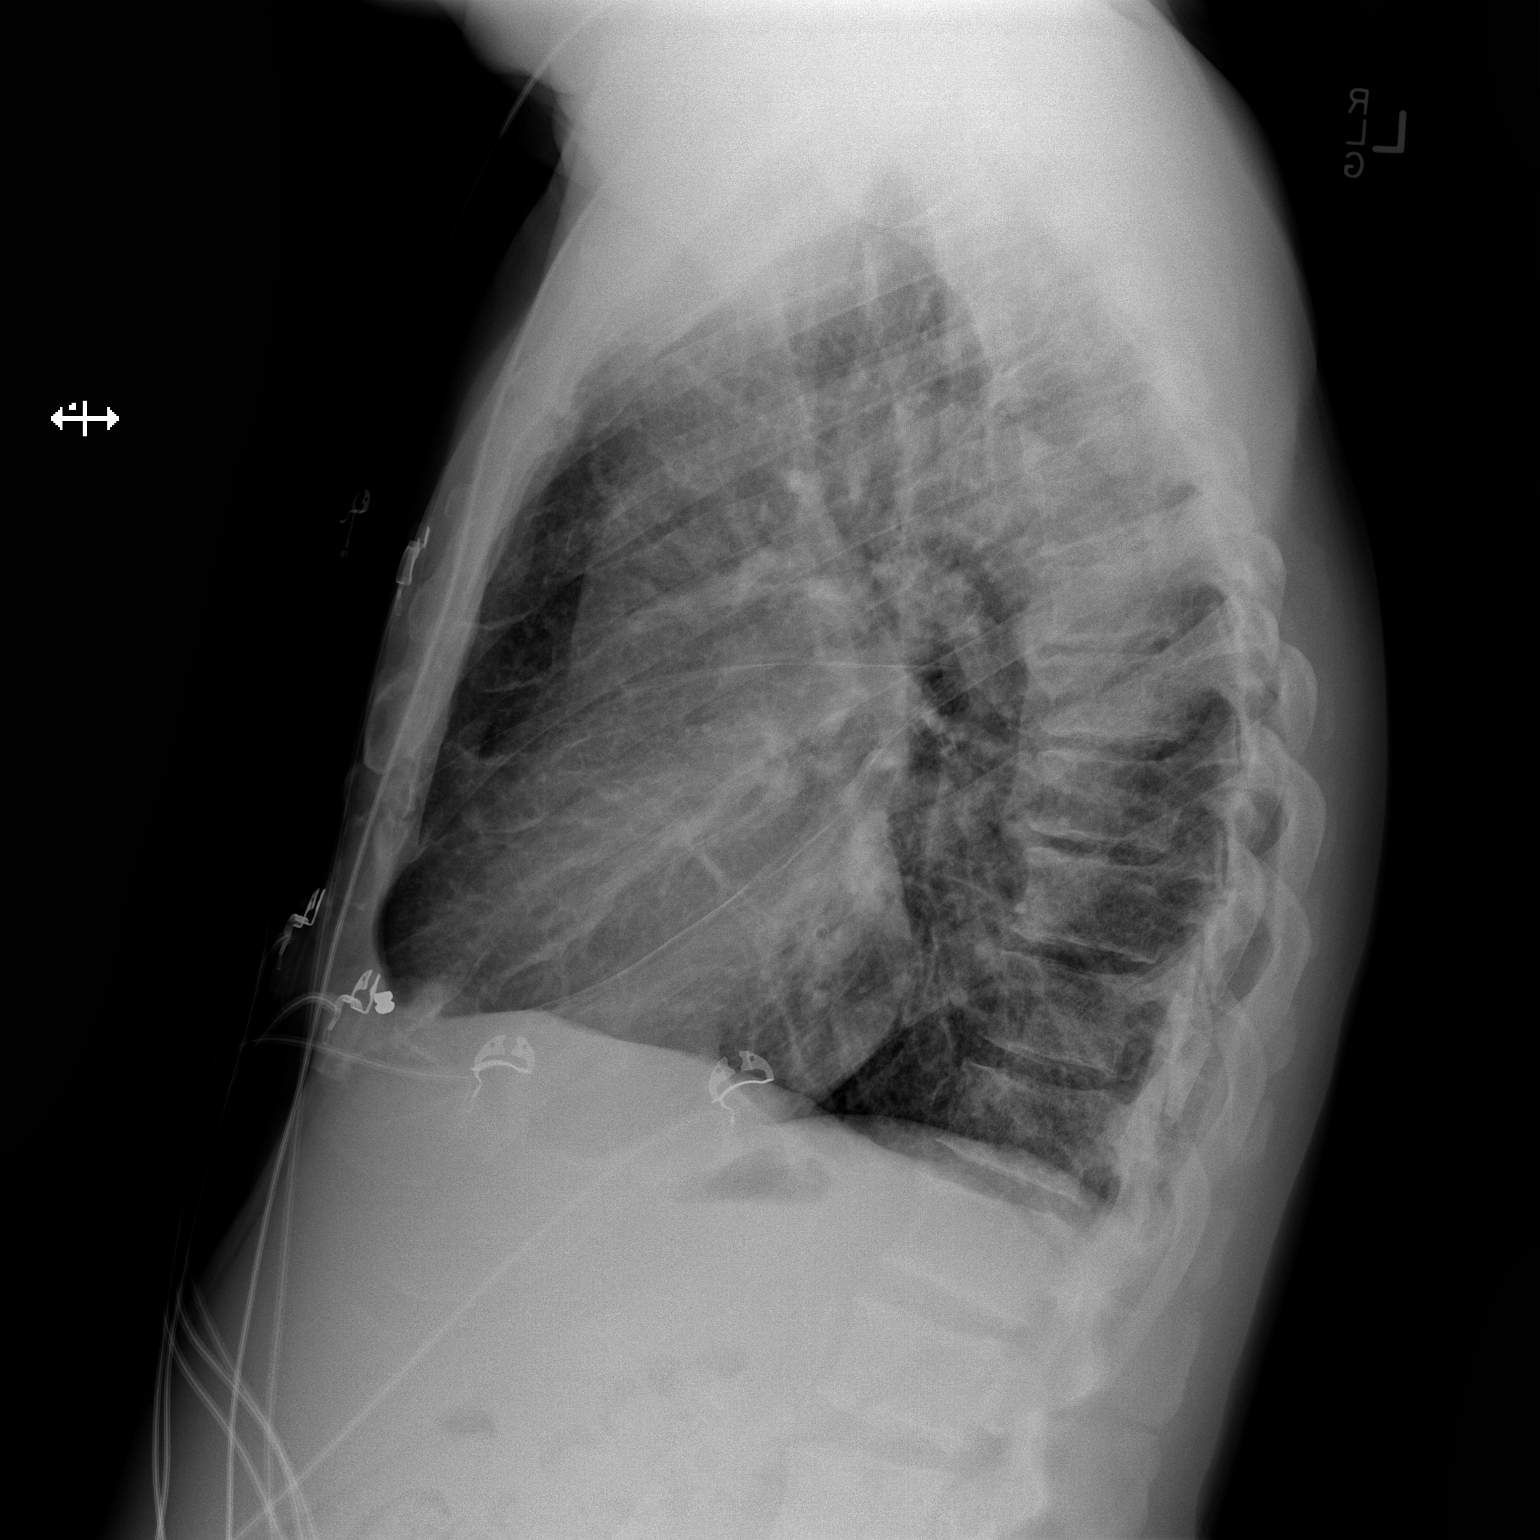

[2 of 2 positions shown; findings below may reference images not displayed]

FINDINGS: Cardiomediastinal contours are unchanged. There are increaseing
interstitial opacities. No confluent airspace disease. No pleural
effusion or pneumothorax. Unchanged hyperinflation from prior. No
acute osseous abnormalities.
IMPRESSION: Increasing interstitial opacities, may reflect atypical infection
versus pulmonary edema. Stable hyperinflation.

## 2015-02-13 MED ORDER — CHLORPROMAZINE HCL 25 MG PO TABS
100.0000 mg | ORAL_TABLET | Freq: Once | ORAL | Status: AC
  Administered 2015-02-13: 100 mg via ORAL
  Filled 2015-02-13 (×2): qty 4

## 2015-02-13 MED ORDER — LORAZEPAM 2 MG/ML IJ SOLN
2.0000 mg | Freq: Once | INTRAMUSCULAR | Status: AC
  Administered 2015-02-13: 2 mg via INTRAMUSCULAR
  Filled 2015-02-13: qty 1

## 2015-02-13 NOTE — ED Provider Notes (Signed)
CSN: [id number]     Arrival date & time 02/13/15  0101 History   First MD Initiated Contact with Patient 02/13/15 [phone]     Chief Complaint  Patient presents with  . defecated on himself      (Consider location/radiation/quality/duration/timing/severity/associated sxs/prior Treatment) HPI Patient is here in the emergency department intoxicated.  He is a chronic alcohol abuser is seen in the emergency department on a very consistent basis.  The patient states that he has not had any complaints at this time, but does not stay awake to answer any other questions.  Patient has no outward signs of trauma or injury Past Medical History  Diagnosis Date  . Diabetes mellitus without complication   . Hypertension   . Schizophrenia   . CHF (congestive heart failure)   . Neuropathy   . Polysubstance abuse   . Cocaine abuse   . Homelessness   . Hepatitis C   . Chronic foot pain   . Homelessness    History reviewed. No pertinent past surgical history. Family History  Problem Relation Age of Onset  . Hypertension Other   . Diabetes Other    Social History  Substance Use Topics  . Smoking status: Current Every Day Smoker -- 1.00 packs/day for 20 years    Types: Cigarettes  . Smokeless tobacco: Current User  . Alcohol Use: Yes    Review of Systems level V caveat applies due to intoxication Allergies  Haldol  Home Medications   Prior to Admission medications   Medication Sig Start Date End Date Taking? Authorizing Provider  acetaminophen (TYLENOL) 500 MG tablet Take 1,000 mg by mouth every 6 (six) hours as needed for mild pain.    Historical Provider, MD  ARIPiprazole (ABILIFY) 5 MG tablet Take 1 tablet (5 mg total) by mouth 2 (two) times daily. 01/27/15   Maryann Mikhail, DO  cloNIDine (CATAPRES) 0.1 MG tablet Take 1 tablet (0.1 mg total) by mouth 2 (two) times daily. 01/27/15   Maryann Mikhail, DO  levofloxacin (LEVAQUIN) 750 MG tablet Take 1 tablet (750 mg total) by mouth daily.  01/27/15   Maryann Mikhail, DO   BP 143/91 mmHg  Pulse 88  Temp(Src) 97.5 F (36.4 C) (Oral)  Resp 22  SpO2 98% Physical Exam  Constitutional: He appears well-developed and well-nourished. No distress.  HENT:  Head: Normocephalic and atraumatic.  Mouth/Throat: Oropharynx is clear and moist.  Eyes: Pupils are equal, round, and reactive to light.  Neck: Normal range of motion. Neck supple.  Cardiovascular: Normal rate, regular rhythm and normal heart sounds.   Pulmonary/Chest: Effort normal and breath sounds normal.  Skin: Skin is warm and dry. No rash noted. No erythema.  Nursing note and vitals reviewed.   ED Course  Procedures (including critical care time) Labs Review Labs Reviewed - No data to display  Imaging Review No results found. I, Jorene Kaylor W, personally reviewed and evaluated these images and lab results as part of my medical decision-making.  Patient is stable here in the emergency department.  He is able to ambulate and has eaten something and will be discharged home    Charlestine Night, PA-C 02/13/15 1557  Derwood Kaplan, MD 02/14/15 2256

## 2015-02-13 NOTE — Discharge Instructions (Signed)
°Emergency Department Resource Guide °1) Find a Doctor and Pay Out of Pocket °Although you won't have to find out who is covered by your insurance plan, it is a good idea to ask around and get recommendations. You will then need to call the office and see if the doctor you have chosen will accept you as a new patient and what types of options they offer for patients who are self-pay. Some doctors offer discounts or will set up payment plans for their patients who do not have insurance, but you will need to ask so you aren't surprised when you get to your appointment. ° °2) Contact Your Local Health Department °Not all health departments have doctors that can see patients for sick visits, but many do, so it is worth a call to see if yours does. If you don't know where your local health department is, you can check in your phone book. The CDC also has a tool to help you locate your state's health department, and many state websites also have listings of all of their local health departments. ° °3) Find a Walk-in Clinic °If your illness is not likely to be very severe or complicated, you may want to try a walk in clinic. These are popping up all over the country in pharmacies, drugstores, and shopping centers. They're usually staffed by nurse practitioners or physician assistants that have been trained to treat common illnesses and complaints. They're usually fairly quick and inexpensive. However, if you have serious medical issues or chronic medical problems, these are probably not your best option. ° °No Primary Care Doctor: °- Call [facility] at  832-8000 - they can help you locate a primary care doctor that  accepts your insurance, provides certain services, etc. °- Physician Referral Service- 1-800-533-3463 ° °Chronic Pain Problems: °Organization         Address  Phone   Notes  °[facility]  (336) 297-2271 Patients need to be referred by their primary care doctor.  ° °Medication  Assistance: °Organization         Address  Phone   Notes  °[place] [facility] [address] °[place], [place] [address] (336) 641-8030 --Must be a resident of [place] °-- Must have NO insurance coverage whatsoever (no Medicaid/ Medicare, etc.) °-- The pt. MUST have a primary care doctor that directs their care regularly and follows them in the community °  °[facility]  (866) 331-1348   °[employer]  (888) 892-1162   ° °Agencies that provide inexpensive medical care: °Organization         Address  Phone   Notes  °[facility]  (336) 832-8035   °[facility]    (336) 832-7272   °[facility] [address] °[place], [place] [address] (336) 832-4777   °[facility] [address], °[place] (336) 271-4999   °[facility]    (336) 373-0678   °[facility]    (336) 272-1050   °[facility] ° [address], [place] Phone:  (336) 832-4444, Fax:  (336) 832-4440 Hours of Operation:  9 am - 6 pm, M-F.  Also accepts Medicaid/Medicare and self-pay.  °[facility] ° [address], [place] Phone: (336) 832-3150, Fax: (336) 832-3151. Hours of Operation:  8:30 am - 5:30 pm, M-F.  Also accepts Medicaid and self-pay.  °[facility] [place] [address], [place] Phone: (336) 878-6027   °[facility] [address], [place], [place] (336)723-1848, Ext. 123 Mondays & Thursdays: 7-9 AM.  First 15 patients are seen on a first come, first serve basis. °  ° °Medicaid-accepting [place] Providers: ° °Organization         Address  Phone   Notes  °[facility] [address], [place] (336) 641-2100 Also accepts self-pay patients.  °[facility] [address], [place] ° (336) 856-9996   °[facility] [address], [place]  (336) 288-8857   °[facility] [address] [place] [address], [place] (336) 299-7000   °Veita Bland [address], [place]  ° (336) 373-1557 Only accepts [place] Access Medicaid patients after they have their name applied to their card.  ° °Self-Pay (no insurance) in [place]: ° °Organization         Address  Phone   Notes  °Sickle Cell Patients, [facility] [address], [place] (336) 832-1970   °[facility] [address], [place] (336) 832-4400   °[facility] ° [address], [place] (336) 992-4800   °[facility]/Dr. Osei-Bonsu ° [address] [place] [address], [place] or [address], [place] (336) 841-8500 Phone number for both [place] and [place] locations is the same.  °[facility] [address], [place] (336) 299-0000   °[facility] [address] [place] [address], [place] or [address] (336) 852-7530 °(336) 878-2260   °[facility] [address], [place] (336) 350-1642, phone; (336) 294-5005, fax Sees patients 1st and 3rd Saturday of every month.  Must not qualify for public or private insurance (i.e. Medicaid, Medicare, [place] Health Choice, Veterans' Benefits) • Household income should be no more than 200% of the poverty level •The clinic cannot treat you if you are pregnant or think you are pregnant • Sexually transmitted diseases are not treated at the clinic.  ° ° °Dental Care: °Organization         Address  Phone  Notes  °[place] [facility] [address], [place] (336) 641-6152 Accepts children up to age 21 who are enrolled in Medicaid or [place] Health Choice; pregnant women with a Medicaid card; and children who have applied for Medicaid or [place] Health Choice, but were declined, whose parents can pay a reduced fee at time of service.  °[place]  [facility] [place]  [address], [place] (336) 641-7733 Accepts children up to age 21 who are enrolled in Medicaid or [place] Health Choice; pregnant women with a Medicaid card; and children who have applied for Medicaid or [place] Health Choice, but were declined, whose parents can pay a reduced fee at time of service.  °[facility] ° [address], [place] (336) 641-4533 Patients are seen by appointment only. Walk-ins are not accepted. [facility] will see patients 18 years of age and older. °Monday - Tuesday (8am-5pm) °Most Wednesdays (8:30-5pm) °$30 per visit, cash only  °[facility] ° [address], [place] (336) 641-4533 Patients are seen by appointment only. Walk-ins are not accepted. [facility] will see patients 18 years of age and older. °One   Wednesday Evening (Monthly: Volunteer Based).  $30 per visit, cash only  °[facility]  (919) 537-3737 for adults; Children under age 4, call Graduate Pediatric Dentistry at (919) [phone]. Children aged 4-14, please call (919) [phone] to request a pediatric application. ° Dental services are provided in all areas of dental care including fillings, crowns and bridges, complete and partial dentures, implants, gum treatment, root canals, and extractions. Preventive care is also provided. Treatment is provided to both adults and children. °Patients are selected via a lottery and there is often a waiting list. °  °[facility] [address], °[place] ° (336) 763-8833 [email or web address] °  °[facility] [address], [place], [place] (336)723-1848, Ext. 123 Second and Fourth Thursday of each month, opens at 6:30 AM; Clinic ends at 9 AM.  Patients are seen on a first-come first-served basis, and a limited number are seen during each clinic.  ° °[facility] ° [address], [place], [place] (336) 723-7904    Eligibility Requirements °You must have lived in [place], [place], or [place] counties for at least the last three months. °  You cannot be eligible for state or federal sponsored healthcare insurance, including Veterans Administration, Medicaid, or Medicare. °  You generally cannot be eligible for healthcare insurance through your employer.  °  How to apply: °Eligibility screenings are held every Tuesday and Wednesday afternoon from 1:00 pm until 4:00 pm. You do not need an appointment for the interview!  °[facility] [address], [place], [place] 336-631-2330   °[facility] [facility]  336-342-8273   °[facility]  336-703-3100   °[facility]  336-570-6415   ° °Behavioral Health Resources in the Community: °Intensive Outpatient Programs °Organization         Address  Phone  Notes  °[facility] [address], [place], [place] 336-878-6098   °[facility] [address], [place], [place] 336-832-9800   °[facility] [address], [place], [place] ° 336-882-2125   °[facility] [address],  °[place], [place] 1-800-853-5163 or 336-641-4981   °Substance Abuse Resources °Organization         Address  Phone  Notes  °[facility]  336-882-2125   °[facility]  336-784-9470   °[facility]  336-285-9073   °[facility]  336-845-3988   °Residential & Outpatient Substance Abuse Program  1-800-659-3381   °Psychological Services °Organization         Address  Phone  Notes  °[facility]  336- 832-9600   °[facility]  336- 378-7881   °[facility] [address], [place] 1-800-853-5163 or 336-641-4981   ° °Mobile Crisis Teams °Organization         Address  Phone  Notes  °[facility], Mobile Crisis Care Unit  1-877-626-1772   °Assertive °[facility] ° [address]  [place], [place] 336-834-9664   °Sharon DeEsch [address] °[place] 336-554-5454   ° °Self-Help/Support Groups °Organization         Address  Phone             Notes  °[facility]  - variety of support groups  336- 373-1402 Call for more information  °Narcotics Anonymous (NA), [facility] [address], °[place]  2 meetings at this location  ° °  Residential Treatment Programs °Organization         Address  Phone  Notes  °[facility] [address],    °[place]  1-866-801-8205   °[facility] ° [address], [place], [place] 704-293-8524   °[facility] [address], [place] 336-845-3988 Admissions: 8am-3pm M-F  °[facility] [address]    °[place], [place] 336-841-1104   °[facility] [address], [place], [place] 336-379-7146   °[facility] [address]  °[place], [place] 336-285-9073   °[facility] - Intensive Outpatient [address], [place], [place] 336-852-3033   °[facility] [address]  °[place], [place] 1-877-615-2722 or 336-784-9470   °[facility] [address] [place], [place] 336-227-7417 Accepts Medicaid  °[facility] [address]  °[place] 1-800-659-3381 Substance Abuse/Addiction Treatment  ° °[place] Behavioral Health Resources °Organization         Address  Phone  Notes  °[facility]  (888) 581-9988   °Julie Brannon, PhD [address] [place], [place]   (336) 349-5553 or (336) 951-0000   °[facility]   [address] °[place], [place] (336) 349-4454   °[facility] [address], [place], [place] (336) 342-8316 Insurance/Medicaid/sponsorship through [facility]  °[facility] [address] [place] [address]                                    [place], [place] (336) 342-8316 Therapy/tele-psych/case    °[facility] [address]  ° [place], [place] (336) 349-2233    °Dr. Arfeen  (336) 349-4544   °[facility] [place]  [employer] [place] [facility] 1) [address],  °2) [address], [place] °3)  [address], [place] (336) 349-3220 °(336) 342-7768 ° °(336) 342-8140   °[place] [facility] (336) 342-1394 or (336) 342-3537 (After Hours)    ° ° °

## 2015-02-13 NOTE — Discharge Instructions (Signed)
Return here as needed. °

## 2015-02-13 NOTE — ED Notes (Signed)
To ed via [employer] from [facility] with c/o increased agitation. Pt requesting thorazine, [facility] told [facility] he would have to stay there for 2 days before being seen.

## 2015-02-13 NOTE — ED Notes (Signed)
Pharmacy called for meds to be sent ASAP.

## 2015-02-13 NOTE — ED Provider Notes (Signed)
CSN: [id number]     Arrival date & time 02/13/15  1113 History   First MD Initiated Contact with Patient 02/13/15 1141     Chief Complaint  Patient presents with  . Schizophrenia      HPI The patient brought to the emergency department from the court house where he was acting unruly.  He has a long-standing history of cocaine abuse and homelessness.  He was just seen in emergency department for similar complaints.  He states he needs medications to help him calm down.  He admits to ongoing cocaine abuse.  He denies alcohol use this morning.  Denies fevers and chills.  Reports he is trying his best to take his medications was having issues taking them on a consistent basis.      Past Medical History  Diagnosis Date  . Diabetes mellitus without complication   . Hypertension   . Schizophrenia   . CHF (congestive heart failure)   . Neuropathy   . Polysubstance abuse   . Cocaine abuse   . Homelessness   . Hepatitis C   . Chronic foot pain   . Homelessness    History reviewed. No pertinent past surgical history. Family History  Problem Relation Age of Onset  . Hypertension Other   . Diabetes Other    Social History  Substance Use Topics  . Smoking status: Current Every Day Smoker -- 1.00 packs/day for 20 years    Types: Cigarettes  . Smokeless tobacco: Current User  . Alcohol Use: Yes    Review of Systems  All other systems reviewed and are negative.     Allergies  Haldol  Home Medications   Prior to Admission medications   Medication Sig Start Date End Date Taking? Authorizing Provider  acetaminophen (TYLENOL) 500 MG tablet Take 1,000 mg by mouth every 6 (six) hours as needed for mild pain.    Historical Provider, MD  ARIPiprazole (ABILIFY) 5 MG tablet Take 1 tablet (5 mg total) by mouth 2 (two) times daily. 01/27/15   Maryann Mikhail, DO  cloNIDine (CATAPRES) 0.1 MG tablet Take 1 tablet (0.1 mg total) by mouth 2 (two) times daily. 01/27/15   Maryann Mikhail, DO   levofloxacin (LEVAQUIN) 750 MG tablet Take 1 tablet (750 mg total) by mouth daily. 01/27/15   Maryann Mikhail, DO   BP 147/90 mmHg  Pulse 89  Temp(Src) 97.8 F (36.6 C) (Oral)  Resp 22  SpO2 99% Physical Exam  Constitutional: He is oriented to person, place, and time. He appears well-developed and well-nourished.  HENT:  Head: Normocephalic and atraumatic.  Eyes: EOM are normal.  Neck: Normal range of motion.  Cardiovascular: Normal rate, regular rhythm, normal heart sounds and intact distal pulses.   Pulmonary/Chest: Effort normal and breath sounds normal. No respiratory distress.  Abdominal: Soft. He exhibits no distension. There is no tenderness.  Musculoskeletal: Normal range of motion.  Neurological: He is alert and oriented to person, place, and time.  Skin: Skin is warm and dry.  Psychiatric: He has a normal mood and affect. Judgment normal.  Nursing note and vitals reviewed.   ED Course  Procedures  Labs Review Labs Reviewed - No data to display  Imaging Review No results found. I, Abu Heavin M, personally reviewed and evaluated these images and lab results as part of my medical decision-making.   EKG Interpretation None      MDM   Final diagnoses:  Schizophrenia, paranoid type  Chronic paranoid schizophrenia  Cocaine use disorder,  severe, dependence    3:31 PM Patient feels much better at this time after medication in the emergency department.  He has a long-standing history of chronic paranoid schizophrenia.    Azalia Bilis, MD 02/13/15 1536

## 2015-02-13 NOTE — ED Notes (Signed)
Lunch Tray order .

## 2015-02-13 NOTE — ED Notes (Signed)
Pt ambulatory at the time of d/c. NAD at this time.

## 2015-02-13 NOTE — ED Notes (Signed)
Pt is in stable condition upon d/c and is escorted from ED via wheelchair. 

## 2015-02-13 NOTE — ED Notes (Signed)
Patient reports c/o "I need Tylenol and a bus ticket. I haven't had my Abilify, and I want some mental health help. Do you hear me?" Patient agitated, then calmed and fell back to sleep. Snoring. No distress noted.

## 2015-02-13 NOTE — ED Notes (Addendum)
Per [facility], pt having multiple complaints. Number one complaint is the fact that he defecated on himself. Per [facility], recent use of cocaine. Pt has been off of his medication for the past 3-4 months. Pt in triage punching walls, security called.

## 2015-02-13 NOTE — ED Notes (Addendum)
Pt here in [employer] custody. sts that he is out of his medication. Per officer pt was at the court house acting out. sts went to [facility] and they told him that he would have to stay there a few days and he got upset. Pt  sts SI

## 2015-02-15 ENCOUNTER — Encounter (HOSPITAL_COMMUNITY): Payer: Self-pay | Admitting: Emergency Medicine

## 2015-02-15 ENCOUNTER — Emergency Department (HOSPITAL_COMMUNITY)
Admission: EM | Admit: 2015-02-15 | Discharge: 2015-02-15 | Disposition: A | Discharge status: Home / Self Care | Payer: Medicare Other | Attending: Emergency Medicine | Admitting: Emergency Medicine

## 2015-02-15 DIAGNOSIS — Z590 Homelessness: Secondary | ICD-10-CM | POA: Insufficient documentation

## 2015-02-15 DIAGNOSIS — Z8619 Personal history of other infectious and parasitic diseases: Secondary | ICD-10-CM | POA: Insufficient documentation

## 2015-02-15 DIAGNOSIS — I509 Heart failure, unspecified: Secondary | ICD-10-CM | POA: Insufficient documentation

## 2015-02-15 DIAGNOSIS — Z720 Tobacco use: Secondary | ICD-10-CM | POA: Insufficient documentation

## 2015-02-15 DIAGNOSIS — Z79899 Other long term (current) drug therapy: Secondary | ICD-10-CM | POA: Insufficient documentation

## 2015-02-15 DIAGNOSIS — G8929 Other chronic pain: Secondary | ICD-10-CM | POA: Insufficient documentation

## 2015-02-15 DIAGNOSIS — F1410 Cocaine abuse, uncomplicated: Secondary | ICD-10-CM | POA: Insufficient documentation

## 2015-02-15 DIAGNOSIS — F200 Paranoid schizophrenia: Secondary | ICD-10-CM | POA: Insufficient documentation

## 2015-02-15 DIAGNOSIS — E119 Type 2 diabetes mellitus without complications: Secondary | ICD-10-CM | POA: Insufficient documentation

## 2015-02-15 DIAGNOSIS — I10 Essential (primary) hypertension: Secondary | ICD-10-CM | POA: Insufficient documentation

## 2015-02-15 DIAGNOSIS — F1910 Other psychoactive substance abuse, uncomplicated: Secondary | ICD-10-CM

## 2015-02-15 DIAGNOSIS — R45851 Suicidal ideations: Secondary | ICD-10-CM

## 2015-02-15 LAB — CBC
HEMATOCRIT: 34.5 % — AB (ref 39.0–52.0)
HEMOGLOBIN: 10.9 g/dL — AB (ref 13.0–17.0)
MCH: 28.5 pg (ref 26.0–34.0)
MCHC: 31.6 g/dL (ref 30.0–36.0)
MCV: 90.3 fL (ref 78.0–100.0)
PLATELETS: 210 10*3/uL (ref 150–400)
RBC: 3.82 MIL/uL — ABNORMAL LOW (ref 4.22–5.81)
RDW: 13.6 % (ref 11.5–15.5)
WBC: 4.5 10*3/uL (ref 4.0–10.5)

## 2015-02-15 LAB — COMPREHENSIVE METABOLIC PANEL
ALBUMIN: 3.6 g/dL (ref 3.5–5.0)
ALK PHOS: 92 U/L (ref 38–126)
ALT: 10 U/L — AB (ref 17–63)
AST: 20 U/L (ref 15–41)
Anion gap: 7 (ref 5–15)
BILIRUBIN TOTAL: 0.4 mg/dL (ref 0.3–1.2)
BUN: 11 mg/dL (ref 6–20)
CALCIUM: 9.2 mg/dL (ref 8.9–10.3)
CO2: 26 mmol/L (ref 22–32)
Chloride: 110 mmol/L (ref 101–111)
Creatinine, Ser: 0.92 mg/dL (ref 0.61–1.24)
GFR calc Af Amer: >60 mL/min (ref >60)
GFR calc non Af Amer: >60 mL/min (ref >60)
GLUCOSE: 108 mg/dL — AB (ref 65–99)
Potassium: 3.6 mmol/L (ref 3.5–5.1)
SODIUM: 143 mmol/L (ref 135–145)
TOTAL PROTEIN: 6.5 g/dL (ref 6.5–8.1)

## 2015-02-15 LAB — SALICYLATE LEVEL: Salicylate Lvl: <4 mg/dL (ref 2.8–30.0)

## 2015-02-15 LAB — RAPID URINE DRUG SCREEN, HOSP PERFORMED
AMPHETAMINES: NOT DETECTED
Barbiturates: NOT DETECTED
Benzodiazepines: NOT DETECTED
Cocaine: POSITIVE — AB
OPIATES: NOT DETECTED
Tetrahydrocannabinol: NOT DETECTED

## 2015-02-15 LAB — ETHANOL: Alcohol, Ethyl (B): <5 mg/dL (ref <5)

## 2015-02-15 LAB — ACETAMINOPHEN LEVEL

## 2015-02-15 MED ORDER — ARIPIPRAZOLE 10 MG PO TABS
5.0000 mg | ORAL_TABLET | Freq: Once | ORAL | Status: AC
  Administered 2015-02-15: 5 mg via ORAL
  Filled 2015-02-15: qty 1

## 2015-02-15 MED ORDER — ACETAMINOPHEN 500 MG PO TABS
1000.0000 mg | ORAL_TABLET | Freq: Once | ORAL | Status: AC
  Administered 2015-02-15: 1000 mg via ORAL
  Filled 2015-02-15: qty 2

## 2015-02-15 NOTE — ED Notes (Signed)
Patient here after using cocaine with suicidal ideation.  No plan at this time.  Patient was at [facility] and was not taken due to cocaine in his system and his high blood pressure.

## 2015-02-15 NOTE — ED Provider Notes (Signed)
CSN: [id number]     Arrival date & time 02/15/15  0518 History   First MD Initiated Contact with Patient 02/15/15 [phone]     Chief Complaint  Patient presents with  . Suicidal  . Drug Problem     (Consider location/radiation/quality/duration/timing/severity/associated sxs/prior Treatment) HPI Comments: Patient is a 54 year old male past medical history significant for DM, HTN, schizophrenia paranoia CHF, polysubstance abuse, homelessness presented to emergency department after cocaine use. Patient endorses suicidal ideation without plan to triage, states he is more frequent overdose on cocaine and I turned my interview of patient. He has no physical complaints at this time. Denies any chest pain or shortness of breath. Denies any homicidal ideations, alcohol use, self injury. He is requesting Tylenol and Abilify. He is also requesting resource guide for detox.   Past Medical History  Diagnosis Date  . Diabetes mellitus without complication   . Hypertension   . Schizophrenia   . CHF (congestive heart failure)   . Neuropathy   . Polysubstance abuse   . Cocaine abuse   . Homelessness   . Hepatitis C   . Chronic foot pain   . Homelessness    History reviewed. No pertinent past surgical history. Family History  Problem Relation Age of Onset  . Hypertension Other   . Diabetes Other    Social History  Substance Use Topics  . Smoking status: Current Every Day Smoker -- 1.00 packs/day for 20 years    Types: Cigarettes  . Smokeless tobacco: Current User  . Alcohol Use: Yes    Review of Systems  All other systems reviewed and are negative.     Allergies  Haldol  Home Medications   Prior to Admission medications   Medication Sig Start Date End Date Taking? Authorizing Provider  acetaminophen (TYLENOL) 500 MG tablet Take 1,000 mg by mouth every 6 (six) hours as needed for mild pain.    Historical Provider, MD  ARIPiprazole (ABILIFY) 5 MG tablet Take 1 tablet (5 mg total) by  mouth 2 (two) times daily. 01/27/15   Maryann Mikhail, DO  cloNIDine (CATAPRES) 0.1 MG tablet Take 1 tablet (0.1 mg total) by mouth 2 (two) times daily. 01/27/15   Maryann Mikhail, DO  levofloxacin (LEVAQUIN) 750 MG tablet Take 1 tablet (750 mg total) by mouth daily. 01/27/15   Maryann Mikhail, DO   BP 159/95 mmHg  Pulse 80  Temp(Src) 97.7 F (36.5 C) (Oral)  Resp 19  Ht  (1.753 m)  Wt 162 lb (73.483 kg)  BMI 23.91 kg/m2  SpO2 97% Physical Exam  Constitutional: He is oriented to person, place, and time. He appears well-developed and well-nourished. No distress.  HENT:  Head: Normocephalic and atraumatic.  Right Ear: External ear normal.  Left Ear: External ear normal.  Nose: Nose normal.  Mouth/Throat: Oropharynx is clear and moist.  Eyes: Conjunctivae are normal.  Neck: Normal range of motion. Neck supple.  No nuchal rigidity.   Cardiovascular: Normal rate, regular rhythm and normal heart sounds.   Pulmonary/Chest: Effort normal and breath sounds normal.  Abdominal: Soft.  Musculoskeletal: Normal range of motion.  Neurological: He is alert and oriented to person, place, and time.  Skin: Skin is warm and dry. He is not diaphoretic.  Psychiatric: He has a normal mood and affect.  Nursing note and vitals reviewed.   ED Course  Procedures (including critical care time) Medications  acetaminophen (TYLENOL) tablet 1,000 mg (1,000 mg Oral Given 02/15/15 0713)  ARIPiprazole (ABILIFY) tablet 5  mg (5 mg Oral Given 02/15/15 0713)    Labs Review Labs Reviewed  COMPREHENSIVE METABOLIC PANEL - Abnormal; Notable for the following:    Glucose, Bld 108 (*)    ALT 10 (*)    All other components within normal limits  ACETAMINOPHEN LEVEL - Abnormal; Notable for the following:    Acetaminophen (Tylenol), Serum <10 (*)    All other components within normal limits  CBC - Abnormal; Notable for the following:    RBC 3.82 (*)    Hemoglobin 10.9 (*)    HCT 34.5 (*)    All other components  within normal limits  URINE RAPID DRUG SCREEN, HOSP PERFORMED - Abnormal; Notable for the following:    Cocaine POSITIVE (*)    All other components within normal limits  ETHANOL  SALICYLATE LEVEL    Imaging Review No results found. I have personally reviewed and evaluated these images and lab results as part of my medical decision-making.   EKG Interpretation None      MDM   Final diagnoses:  Chronic paranoid schizophrenia  Suicidal ideations  Polysubstance abuse   Filed Vitals:   02/15/15 0525  BP: 159/95  Pulse: 80  Temp: 97.7 F (36.5 C)  Resp: 19   Afebrile, NAD, non-toxic appearing, AAOx4.   Patient well-known to the ED for multiple visits related to schizophrenia and drug abuse. Labs reviewed in unremarkable today. No alcohol or benzodiazepine use. Positive for cocaine chest pain or related symptoms. No true suicidal ideation at this time. Will discharge patient home with resource guide for behavioral health. Patient is agreeable to plan after receiving his home dose of Abilify Tylenol. Return precautions discussed. Patient is agreeable to plan and stable at time of discharge.    Francee Piccolo, PA-C 02/15/15 1017  Derwood Kaplan, MD 02/16/15 1007

## 2015-02-15 NOTE — Discharge Instructions (Signed)
Please follow up with your primary care physician in 1-2 days. If you do not have one please call the [facility] number listed above. Please read all discharge instructions and return precautions.  ° ° ° °Chemical Dependency °Chemical dependency is an addiction to drugs or alcohol. It is characterized by the repeated behavior of seeking out and using drugs and alcohol despite harmful consequences to the health and safety of ones self and others.  °RISK FACTORS °There are certain situations or behaviors that increase a person's risk for chemical dependency. These include: °· A family history of chemical dependency. °· A history of mental health issues, including depression and anxiety. °· A home environment where drugs and alcohol are easily available to you. °· Drug or alcohol use at a young age. °SYMPTOMS  °The following symptoms can indicate chemical dependency: °· Inability to limit the use of drugs or alcohol. °· Nausea, sweating, shakiness, and anxiety that occurs when alcohol or drugs are not being used. °· An increase in amount of drugs or alcohol that is necessary to get drunk or high. °People who experience these symptoms can assess their use of drugs and alcohol by asking themselves the following questions: °· Have you been told by friends or family that they are worried about your use of alcohol or drugs? °· Do friends and family ever tell you about things you did while drinking alcohol or using drugs that you do not remember? °· Do you lie about using alcohol or drugs or about the amounts you use? °· Do you have difficulty completing daily tasks unless you use alcohol or drugs? °· Is the level of your work or school performance lower because of your drug or alcohol use? °· Do you get sick from using drugs or alcohol but keep using anyway? °· Do you feel uncomfortable in social situations unless you use alcohol or drugs? °· Do you use drugs or alcohol to help forget problems?  °An  answer of yes to any of these questions may indicate chemical dependency. Professional evaluation is suggested. °Document Released: 06/11/2001 Document Revised: 09/09/2011 Document Reviewed: 08/23/2010 °ExitCare® Patient Information ©2015 [employer]. This information is not intended to replace advice given to you by your health care provider. Make sure you discuss any questions you have with your health care provider. ° °If you do not have a primary care doctor to follow up with regarding today's visit, please call the [facility] at [phone] to make an appointment. Hours of operation are 10am - 7pm, Monday through Friday, and they have a sliding scale fee.  ° ° ° °RESOURCE GUIDE ° °Emergency Shelter:  [facility] ([phone] ° ° °Intensive Outpatient Programs: °[facility]      °[address] °[place], [place] °336-878-6098 °Both a day and evening program °      °[facility]     °[address]        °[place], [place] [address] °336-832-9800        ° °[facility] °[address] °[place] °336-882-2125 ° °[facility] °ACCESS LINE: 1-800-853-5163 or 336-641-4981 °[address] °[place], [place] [address] °Http://www.[email or web address]/services/adult.htm ° ° °Substance Abuse Resources: °- [facility]  336-882-2125 °- [facility] 336-784-9470 °- [facility] 336-285-9073 °- [facility] 336-845-3988 °- Residential & Outpatient Substance Abuse Program  800-659-3381 ° °Psychological Services: °- [facility]  832-9600 °- [facility]  378-7881 °- [facility], [address], [employer], ACCESS LINE: 1-800-853-5163 or 336-641-4981, Http://www.[facility]com/services/adult.htm ° °Mobile Crisis Teams:         °                               °[facility]         °Mobile Crisis Care  Unit °1-877-626-1772       °      °Assertive °[facility] °[address] °336-834-9664 °                                        °[facility] °Sharon DeEsch °[address] °[place] °336-554-5454 ° °Self-Help/Support Groups: °[facility] of support groups °373-1402 (call for more info) ° °Narcotics Anonymous (NA) °[facility] °[address] °[place] - 2 meetings at this location ° °Residential Treatment Programs:  °[facility]      °[address]        °[place]       °866-801-8205        ° °[facility] °[address] °[place], [place]  [address] °704-293-8524 ° °[facility]  °[address] °[place], [place] [address] °336-845-3988 °Admissions: 8am-3pm M-F ° °[facility]     °[address]        °[place], [place] [address]       °336-841-1104        ° °[facility] °[address] °[place], [place] °336-379-7146 ° °[facility] °[address] °[place], [place] °336-285-9073 ° °[facility] Intensive Outpatient      °[address]     °[place], [place]       °852-3033        ° °[facility]     °[address] °[place], [place] °877-615-2722 or 336-784-9470 ° °[facility] ([facility]), Medicaid °[address] °[place], [place] °336-227-7417 ° °[facility]                                               °[address] ° [place] °800-659-3381 ° °[facility] Resources: °[facility]- 1-888-581-9988              ° °General Therapy                                                °Julie Brannon, PhD        °[address]                                       °[place],  [address]         °336-349-5553   °Insurance ° °[facility]   °[address] °[place],  [address] °336-349-4454 ° °[facility] °  [address]  [place], [place] [address] [phone] Insurance/Medicaid/sponsorship through [facility]                                              [address]                                        [place], [place] [address]    Therapy/tele-psych/case         [phone]          [facility] [address] [place], [place]  [address]  Adolescent/group home/case management [phone]                                           Creola Corn PhD       General therapy       Insurance   [phone]         Dr. Lolly Mustache, Insurance, M-F [phone]

## 2015-02-15 NOTE — ED Notes (Signed)
Staffing and charge notified.   

## 2015-03-07 ENCOUNTER — Emergency Department (HOSPITAL_COMMUNITY)
Admission: EM | Admit: 2015-03-07 | Discharge: 2015-03-07 | Discharge status: Against Medical Advice | Payer: Medicare Other | Attending: Emergency Medicine | Admitting: Emergency Medicine

## 2015-03-07 ENCOUNTER — Encounter (HOSPITAL_COMMUNITY): Payer: Self-pay | Admitting: Emergency Medicine

## 2015-03-07 DIAGNOSIS — M79672 Pain in left foot: Secondary | ICD-10-CM | POA: Insufficient documentation

## 2015-03-07 DIAGNOSIS — I509 Heart failure, unspecified: Secondary | ICD-10-CM | POA: Insufficient documentation

## 2015-03-07 DIAGNOSIS — M79671 Pain in right foot: Secondary | ICD-10-CM | POA: Insufficient documentation

## 2015-03-07 DIAGNOSIS — E119 Type 2 diabetes mellitus without complications: Secondary | ICD-10-CM | POA: Insufficient documentation

## 2015-03-07 DIAGNOSIS — I10 Essential (primary) hypertension: Secondary | ICD-10-CM | POA: Insufficient documentation

## 2015-03-07 DIAGNOSIS — G8929 Other chronic pain: Secondary | ICD-10-CM | POA: Insufficient documentation

## 2015-03-07 DIAGNOSIS — Z720 Tobacco use: Secondary | ICD-10-CM | POA: Insufficient documentation

## 2015-03-07 NOTE — ED Notes (Signed)
Pt left AMA. Pt was unwilling to wait on the MD.

## 2015-03-07 NOTE — ED Notes (Signed)
Pt states he has been smoking crack all day. Pt stated he has been on his feet walking around all too, and his feet are sore. Pt with diabetic history.

## 2015-03-10 ENCOUNTER — Encounter (HOSPITAL_COMMUNITY): Payer: Self-pay | Admitting: *Deleted

## 2015-03-10 ENCOUNTER — Emergency Department (HOSPITAL_COMMUNITY)
Admission: EM | Admit: 2015-03-10 | Discharge: 2015-03-10 | Disposition: A | Discharge status: Home / Self Care | Payer: Medicare Other | Attending: Emergency Medicine | Admitting: Emergency Medicine

## 2015-03-10 DIAGNOSIS — Z8619 Personal history of other infectious and parasitic diseases: Secondary | ICD-10-CM | POA: Insufficient documentation

## 2015-03-10 DIAGNOSIS — I509 Heart failure, unspecified: Secondary | ICD-10-CM | POA: Insufficient documentation

## 2015-03-10 DIAGNOSIS — Z8659 Personal history of other mental and behavioral disorders: Secondary | ICD-10-CM | POA: Insufficient documentation

## 2015-03-10 DIAGNOSIS — E119 Type 2 diabetes mellitus without complications: Secondary | ICD-10-CM | POA: Insufficient documentation

## 2015-03-10 DIAGNOSIS — I10 Essential (primary) hypertension: Secondary | ICD-10-CM | POA: Insufficient documentation

## 2015-03-10 DIAGNOSIS — R1013 Epigastric pain: Secondary | ICD-10-CM | POA: Insufficient documentation

## 2015-03-10 DIAGNOSIS — M79671 Pain in right foot: Secondary | ICD-10-CM

## 2015-03-10 DIAGNOSIS — G8929 Other chronic pain: Secondary | ICD-10-CM | POA: Insufficient documentation

## 2015-03-10 DIAGNOSIS — Z720 Tobacco use: Secondary | ICD-10-CM | POA: Insufficient documentation

## 2015-03-10 DIAGNOSIS — Z590 Homelessness: Secondary | ICD-10-CM | POA: Insufficient documentation

## 2015-03-10 DIAGNOSIS — Z79899 Other long term (current) drug therapy: Secondary | ICD-10-CM | POA: Insufficient documentation

## 2015-03-10 DIAGNOSIS — M79672 Pain in left foot: Secondary | ICD-10-CM | POA: Insufficient documentation

## 2015-03-10 LAB — COMPREHENSIVE METABOLIC PANEL
ALT: 8 U/L — AB (ref 17–63)
AST: 23 U/L (ref 15–41)
Albumin: 3.8 g/dL (ref 3.5–5.0)
Alkaline Phosphatase: 80 U/L (ref 38–126)
Anion gap: 9 (ref 5–15)
BUN: 24 mg/dL — AB (ref 6–20)
CHLORIDE: 103 mmol/L (ref 101–111)
CO2: 25 mmol/L (ref 22–32)
CREATININE: 1.24 mg/dL (ref 0.61–1.24)
Calcium: 9.4 mg/dL (ref 8.9–10.3)
GFR calc Af Amer: >60 mL/min (ref >60)
GFR calc non Af Amer: >60 mL/min (ref >60)
Glucose, Bld: 138 mg/dL — ABNORMAL HIGH (ref 65–99)
Potassium: 3.6 mmol/L (ref 3.5–5.1)
SODIUM: 137 mmol/L (ref 135–145)
Total Bilirubin: 0.8 mg/dL (ref 0.3–1.2)
Total Protein: 7.1 g/dL (ref 6.5–8.1)

## 2015-03-10 LAB — CBC
HCT: 33.1 % — ABNORMAL LOW (ref 39.0–52.0)
Hemoglobin: 10.9 g/dL — ABNORMAL LOW (ref 13.0–17.0)
MCH: 28.7 pg (ref 26.0–34.0)
MCHC: 32.9 g/dL (ref 30.0–36.0)
MCV: 87.1 fL (ref 78.0–100.0)
PLATELETS: 156 10*3/uL (ref 150–400)
RBC: 3.8 MIL/uL — ABNORMAL LOW (ref 4.22–5.81)
RDW: 12.8 % (ref 11.5–15.5)
WBC: 5.5 10*3/uL (ref 4.0–10.5)

## 2015-03-10 LAB — URINALYSIS, ROUTINE W REFLEX MICROSCOPIC
BILIRUBIN URINE: NEGATIVE
GLUCOSE, UA: NEGATIVE mg/dL
Hgb urine dipstick: NEGATIVE
KETONES UR: NEGATIVE mg/dL
Nitrite: NEGATIVE
PH: 5 (ref 5.0–8.0)
Protein, ur: 30 mg/dL — AB
Specific Gravity, Urine: 1.026 (ref 1.005–1.030)
Urobilinogen, UA: 0.2 mg/dL (ref 0.0–1.0)

## 2015-03-10 LAB — URINE MICROSCOPIC-ADD ON

## 2015-03-10 LAB — LIPASE, BLOOD: LIPASE: 18 U/L — AB (ref 22–51)

## 2015-03-10 MED ORDER — ARIPIPRAZOLE 10 MG PO TABS
5.0000 mg | ORAL_TABLET | Freq: Once | ORAL | Status: AC
  Administered 2015-03-10: 5 mg via ORAL
  Filled 2015-03-10: qty 1

## 2015-03-10 MED ORDER — ACETAMINOPHEN 500 MG PO TABS
1000.0000 mg | ORAL_TABLET | Freq: Once | ORAL | Status: AC
Start: 2015-03-10 — End: 2015-03-10
  Administered 2015-03-10: 1000 mg via ORAL
  Filled 2015-03-10: qty 2

## 2015-03-10 NOTE — ED Provider Notes (Signed)
CSN: [id number]     Arrival date & time 03/10/15  0229 History   First MD Initiated Contact with Patient 03/10/15 [phone]     Chief Complaint  Patient presents with  . Abdominal Pain  . Foot Pain     (Consider location/radiation/quality/duration/timing/severity/associated sxs/prior Treatment) Patient is a 54 y.o. male presenting with abdominal pain and lower extremity pain. The history is provided by the patient. No language interpreter was used.  Abdominal Pain Pain location:  Epigastric Pain quality: burning   Pain radiates to:  Does not radiate Pain severity:  No pain Onset quality:  Gradual Duration:  1 day Timing:  Constant Progression:  Unchanged Chronicity:  New Context: not alcohol use, not awakening from sleep, not diet changes, not eating, not laxative use, not medication withdrawal, not previous surgeries, not recent illness, not recent sexual activity, not retching, not sick contacts, not suspicious food intake and not trauma   Relieved by:  Nothing Worsened by:  Nothing tried Ineffective treatments:  None tried Associated symptoms: no chest pain, no chills, no diarrhea, no dysuria, no fatigue, no fever, no nausea, no shortness of breath and no vomiting   Risk factors: no aspirin use, not elderly, has not had multiple surgeries, no NSAID use, not pregnant and no recent hospitalization   Foot Pain Associated symptoms include abdominal pain and arthralgias. Pertinent negatives include no chest pain, chills, fatigue, fever, nausea, neck pain, vomiting or weakness.    Past Medical History  Diagnosis Date  . Diabetes mellitus without complication   . Hypertension   . Schizophrenia   . CHF (congestive heart failure)   . Neuropathy   . Polysubstance abuse   . Cocaine abuse   . Homelessness   . Hepatitis C   . Chronic foot pain   . Homelessness    History reviewed. No pertinent past surgical history. Family History  Problem Relation Age of Onset  . Hypertension Other    . Diabetes Other    Social History  Substance Use Topics  . Smoking status: Current Every Day Smoker -- 1.00 packs/day for 20 years    Types: Cigarettes  . Smokeless tobacco: Current User  . Alcohol Use: Yes    Review of Systems  Constitutional: Negative for fever, chills and fatigue.  HENT: Negative for trouble swallowing.   Eyes: Negative for visual disturbance.  Respiratory: Negative for shortness of breath.   Cardiovascular: Negative for chest pain and palpitations.  Gastrointestinal: Positive for abdominal pain. Negative for nausea, vomiting and diarrhea.  Genitourinary: Negative for dysuria and difficulty urinating.  Musculoskeletal: Positive for arthralgias. Negative for neck pain.  Skin: Negative for color change.  Neurological: Negative for dizziness and weakness.  Psychiatric/Behavioral: Negative for dysphoric mood.      Allergies  Haldol  Home Medications   Prior to Admission medications   Medication Sig Start Date End Date Taking? Authorizing Provider  acetaminophen (TYLENOL) 500 MG tablet Take 1,000 mg by mouth every 6 (six) hours as needed for mild pain.    Historical Provider, MD  metFORMIN (GLUCOPHAGE) 1000 MG tablet Take 1,000 mg by mouth 2 (two) times daily with a meal.    Historical Provider, MD   BP 114/67 mmHg  Pulse 108  Temp(Src) 98 F (36.7 C)  Resp 20  SpO2 96% Physical Exam  Constitutional: He appears well-developed and well-nourished. No distress.  HENT:  Head: Normocephalic and atraumatic.  Eyes: Conjunctivae and EOM are normal.  Neck: Normal range of motion.  Cardiovascular: Normal  rate and regular rhythm.  Exam reveals no gallop and no friction rub.   No murmur heard. Pulmonary/Chest: Effort normal and breath sounds normal. He has no wheezes. He has no rales. He exhibits no tenderness.  Abdominal: Soft. He exhibits no distension. There is tenderness. There is no rebound.  Epigastric tenderness palpation. No other focal tenderness.    Musculoskeletal: Normal range of motion.  Neurological: He is alert.  Speech is goal-oriented. Moves limbs without ataxia.   Skin: Skin is warm and dry.  Psychiatric: He has a normal mood and affect. His behavior is normal.  Pressured speech  Nursing note and vitals reviewed.   ED Course  Procedures (including critical care time) Labs Review Labs Reviewed  LIPASE, BLOOD - Abnormal; Notable for the following:    Lipase 18 (*)    All other components within normal limits  COMPREHENSIVE METABOLIC PANEL - Abnormal; Notable for the following:    Glucose, Bld 138 (*)    BUN 24 (*)    ALT 8 (*)    All other components within normal limits  CBC - Abnormal; Notable for the following:    RBC 3.80 (*)    Hemoglobin 10.9 (*)    HCT 33.1 (*)    All other components within normal limits  URINALYSIS, ROUTINE W REFLEX MICROSCOPIC (NOT AT [facility]) - Abnormal; Notable for the following:    Color, Urine AMBER (*)    APPearance CLOUDY (*)    Protein, ur 30 (*)    Leukocytes, UA SMALL (*)    All other components within normal limits  URINE MICROSCOPIC-ADD ON - Abnormal; Notable for the following:    Casts HYALINE CASTS (*)    All other components within normal limits    Imaging Review No results found. I have personally reviewed and evaluated these images and lab results as part of my medical decision-making.   EKG Interpretation None      MDM   Final diagnoses:  Foot pain, bilateral  Epigastric pain    4:15 AM Patient here requesting tylenol, abilify, and a bus pass. Vitals stable and patient afebrile. No SI/HI. Vitals stable and patient afebrile.    Emilia Beck, PA-C 03/10/15 0423  Tomasita Crumble, MD 03/10/15 [phone]

## 2015-03-10 NOTE — ED Notes (Signed)
Pt c/o abd pain and foot pain. States he just got out of jail and was outside [employer] when he got mad at some strippers because they would not give him money. Had a bystander call 911.

## 2015-03-12 ENCOUNTER — Encounter (HOSPITAL_COMMUNITY): Payer: Self-pay | Admitting: Emergency Medicine

## 2015-03-12 ENCOUNTER — Emergency Department (HOSPITAL_COMMUNITY)
Admission: EM | Admit: 2015-03-12 | Discharge: 2015-03-13 | Disposition: A | Discharge status: Home / Self Care | Payer: Medicare Other | Attending: Emergency Medicine | Admitting: Emergency Medicine

## 2015-03-12 DIAGNOSIS — F32A Depression, unspecified: Secondary | ICD-10-CM

## 2015-03-12 DIAGNOSIS — F200 Paranoid schizophrenia: Secondary | ICD-10-CM | POA: Diagnosis present

## 2015-03-12 DIAGNOSIS — F1420 Cocaine dependence, uncomplicated: Secondary | ICD-10-CM | POA: Diagnosis present

## 2015-03-12 DIAGNOSIS — I509 Heart failure, unspecified: Secondary | ICD-10-CM | POA: Insufficient documentation

## 2015-03-12 DIAGNOSIS — F1410 Cocaine abuse, uncomplicated: Secondary | ICD-10-CM | POA: Insufficient documentation

## 2015-03-12 DIAGNOSIS — I10 Essential (primary) hypertension: Secondary | ICD-10-CM | POA: Insufficient documentation

## 2015-03-12 DIAGNOSIS — E119 Type 2 diabetes mellitus without complications: Secondary | ICD-10-CM | POA: Insufficient documentation

## 2015-03-12 DIAGNOSIS — G8929 Other chronic pain: Secondary | ICD-10-CM | POA: Insufficient documentation

## 2015-03-12 DIAGNOSIS — Z8619 Personal history of other infectious and parasitic diseases: Secondary | ICD-10-CM | POA: Insufficient documentation

## 2015-03-12 DIAGNOSIS — R109 Unspecified abdominal pain: Secondary | ICD-10-CM | POA: Insufficient documentation

## 2015-03-12 DIAGNOSIS — F329 Major depressive disorder, single episode, unspecified: Secondary | ICD-10-CM | POA: Insufficient documentation

## 2015-03-12 DIAGNOSIS — Z79899 Other long term (current) drug therapy: Secondary | ICD-10-CM | POA: Insufficient documentation

## 2015-03-12 DIAGNOSIS — Z590 Homelessness: Secondary | ICD-10-CM | POA: Insufficient documentation

## 2015-03-12 DIAGNOSIS — R112 Nausea with vomiting, unspecified: Secondary | ICD-10-CM | POA: Insufficient documentation

## 2015-03-12 DIAGNOSIS — F1994 Other psychoactive substance use, unspecified with psychoactive substance-induced mood disorder: Secondary | ICD-10-CM | POA: Diagnosis present

## 2015-03-12 LAB — RAPID URINE DRUG SCREEN, HOSP PERFORMED
AMPHETAMINES: NOT DETECTED
BARBITURATES: NOT DETECTED
BENZODIAZEPINES: NOT DETECTED
COCAINE: POSITIVE — AB
Opiates: NOT DETECTED
TETRAHYDROCANNABINOL: NOT DETECTED

## 2015-03-12 LAB — COMPREHENSIVE METABOLIC PANEL
ALT: 11 U/L — AB (ref 17–63)
AST: 21 U/L (ref 15–41)
Albumin: 3.5 g/dL (ref 3.5–5.0)
Alkaline Phosphatase: 81 U/L (ref 38–126)
Anion gap: 4 — ABNORMAL LOW (ref 5–15)
BUN: 12 mg/dL (ref 6–20)
CHLORIDE: 109 mmol/L (ref 101–111)
CO2: 29 mmol/L (ref 22–32)
CREATININE: 1.02 mg/dL (ref 0.61–1.24)
Calcium: 9.3 mg/dL (ref 8.9–10.3)
Glucose, Bld: 129 mg/dL — ABNORMAL HIGH (ref 65–99)
POTASSIUM: 3.5 mmol/L (ref 3.5–5.1)
SODIUM: 142 mmol/L (ref 135–145)
Total Bilirubin: 0.6 mg/dL (ref 0.3–1.2)
Total Protein: 6.3 g/dL — ABNORMAL LOW (ref 6.5–8.1)

## 2015-03-12 LAB — URINALYSIS, ROUTINE W REFLEX MICROSCOPIC
Bilirubin Urine: NEGATIVE
GLUCOSE, UA: 100 mg/dL — AB
Hgb urine dipstick: NEGATIVE
KETONES UR: NEGATIVE mg/dL
LEUKOCYTES UA: NEGATIVE
NITRITE: NEGATIVE
PH: 7.5 (ref 5.0–8.0)
Protein, ur: 30 mg/dL — AB
SPECIFIC GRAVITY, URINE: 1.021 (ref 1.005–1.030)
Urobilinogen, UA: 1 mg/dL (ref 0.0–1.0)

## 2015-03-12 LAB — CBC
HEMATOCRIT: 31.1 % — AB (ref 39.0–52.0)
HEMOGLOBIN: 9.8 g/dL — AB (ref 13.0–17.0)
MCH: 28.4 pg (ref 26.0–34.0)
MCHC: 31.5 g/dL (ref 30.0–36.0)
MCV: 90.1 fL (ref 78.0–100.0)
Platelets: 161 10*3/uL (ref 150–400)
RBC: 3.45 MIL/uL — AB (ref 4.22–5.81)
RDW: 13 % (ref 11.5–15.5)
WBC: 5.4 10*3/uL (ref 4.0–10.5)

## 2015-03-12 LAB — LIPASE, BLOOD: LIPASE: 19 U/L — AB (ref 22–51)

## 2015-03-12 LAB — URINE MICROSCOPIC-ADD ON

## 2015-03-12 MED ORDER — LORAZEPAM 1 MG PO TABS
1.0000 mg | ORAL_TABLET | Freq: Three times a day (TID) | ORAL | Status: DC | PRN
  Administered 2015-03-12: 1 mg via ORAL
  Filled 2015-03-12: qty 1

## 2015-03-12 MED ORDER — NICOTINE 21 MG/24HR TD PT24
21.0000 mg | MEDICATED_PATCH | Freq: Every day | TRANSDERMAL | Status: DC
  Administered 2015-03-12: 21 mg via TRANSDERMAL
  Filled 2015-03-12: qty 1

## 2015-03-12 MED ORDER — ALUM & MAG HYDROXIDE-SIMETH 200-200-20 MG/5ML PO SUSP
30.0000 mL | ORAL | Status: DC | PRN
Start: 2015-03-12 — End: 2015-03-13

## 2015-03-12 MED ORDER — ACETAMINOPHEN 325 MG PO TABS
650.0000 mg | ORAL_TABLET | ORAL | Status: DC | PRN
  Administered 2015-03-12 – 2015-03-13 (×4): 650 mg via ORAL
  Filled 2015-03-12 (×4): qty 2

## 2015-03-12 MED ORDER — METFORMIN HCL 500 MG PO TABS
1000.0000 mg | ORAL_TABLET | Freq: Two times a day (BID) | ORAL | Status: DC
  Administered 2015-03-12 – 2015-03-13 (×2): 1000 mg via ORAL
  Filled 2015-03-12 (×2): qty 2

## 2015-03-12 NOTE — ED Notes (Addendum)
Per patient started having sharp abdominal pain pointing to epigastric region.  Reports drinking a quart of moonshine an hour prior to pain starting.  Requests a list of resources for rehab.  Denies any SI or HI at beginning of triage.  By end of triage states i need to see someone because I am suicidal.  Requesting graham crackers and milk at triage.

## 2015-03-12 NOTE — ED Notes (Signed)
Clothing removed from room.  Charge nurse and bed control made aware of need for sitter.  Amy ED Tech at the bedside at this time.

## 2015-03-12 NOTE — ED Notes (Signed)
Pt aware of need for urine specimen. 
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
## 2015-03-12 NOTE — ED Notes (Signed)
PT STATES HE IS READY TO LEAVE NOW. STATES HE IS NO LONGER SI. ADVISED PT ERIC, [facility], AWARE AND WILL TRY TO GET HIM ASSESSED THIS EVENING. VOICED UNDERSTANDING.

## 2015-03-12 NOTE — [facility] Counselor (Signed)
submitted referral packet on 03/12/15 at 9:16p.m. to [facility] K. Tiburcio Pea, MS  Counselor 03/12/2015 9:39 PM

## 2015-03-12 NOTE — ED Notes (Signed)
Patient was given 2 cups of milk.

## 2015-03-12 NOTE — ED Provider Notes (Signed)
CSN: [id number]     Arrival date & time 03/12/15  0410 History   First MD Initiated Contact with Patient 03/12/15 [phone]     Chief Complaint  Patient presents with  . Abdominal Pain  . Hematemesis    per patient pta.     (Consider location/radiation/quality/duration/timing/severity/associated sxs/prior Treatment) HPI Comments: Patient is a 54 year old male with history of psychiatric issues, chronic nausea and vomiting, diabetes. He is brought by EMS for evaluation of vomiting and abdominal pain. He states he was vomiting up blood. This was his initial complaint, however shortly after arriving here he began to state that he was suicidal. He denies any fevers or chills. He denies any diarrhea. He denies any bloody stool.  The history is provided by the patient.    Past Medical History  Diagnosis Date  . Diabetes mellitus without complication   . Hypertension   . Schizophrenia   . CHF (congestive heart failure)   . Neuropathy   . Polysubstance abuse   . Cocaine abuse   . Homelessness   . Hepatitis C   . Chronic foot pain   . Homelessness    History reviewed. No pertinent past surgical history. Family History  Problem Relation Age of Onset  . Hypertension Other   . Diabetes Other    Social History  Substance Use Topics  . Smoking status: Current Every Day Smoker -- 1.00 packs/day for 20 years    Types: Cigarettes  . Smokeless tobacco: Current User  . Alcohol Use: Yes    Review of Systems  All other systems reviewed and are negative.     Allergies  Haldol  Home Medications   Prior to Admission medications   Medication Sig Start Date End Date Taking? Authorizing Provider  acetaminophen (TYLENOL) 500 MG tablet Take 1,000 mg by mouth every 6 (six) hours as needed for mild pain.    Historical Provider, MD  metFORMIN (GLUCOPHAGE) 1000 MG tablet Take 1,000 mg by mouth 2 (two) times daily with a meal.    Historical Provider, MD   BP 142/91 mmHg  Pulse 88  Temp(Src)  98.2 F (36.8 C) (Oral)  Resp 21  Ht  (1.753 m)  Wt 165 lb (74.844 kg)  BMI 24.36 kg/m2  SpO2 98% Physical Exam  Constitutional: He is oriented to person, place, and time. He appears well-developed and well-nourished. No distress.  HENT:  Head: Normocephalic and atraumatic.  Mouth/Throat: Oropharynx is clear and moist.  Neck: Normal range of motion. Neck supple.  Cardiovascular: Normal rate, regular rhythm and normal heart sounds.   No murmur heard. Pulmonary/Chest: Effort normal and breath sounds normal. No respiratory distress. He has no wheezes.  Abdominal: Soft. Bowel sounds are normal. He exhibits no distension. There is no tenderness.  Musculoskeletal: Normal range of motion. He exhibits no edema.  Neurological: He is alert and oriented to person, place, and time.  Skin: Skin is warm and dry. He is not diaphoretic.  Nursing note and vitals reviewed.   ED Course  Procedures (including critical care time) Labs Review Labs Reviewed  LIPASE, BLOOD - Abnormal; Notable for the following:    Lipase 19 (*)    All other components within normal limits  COMPREHENSIVE METABOLIC PANEL - Abnormal; Notable for the following:    Glucose, Bld 129 (*)    Total Protein 6.3 (*)    ALT 11 (*)    Anion gap 4 (*)    All other components within normal limits  CBC -  Abnormal; Notable for the following:    RBC 3.45 (*)    Hemoglobin 9.8 (*)    HCT 31.1 (*)    All other components within normal limits  URINALYSIS, ROUTINE W REFLEX MICROSCOPIC (NOT AT [facility])  URINE RAPID DRUG SCREEN, HOSP PERFORMED    Imaging Review No results found. I have personally reviewed and evaluated these images and lab results as part of my medical decision-making.   EKG Interpretation None      MDM   Final diagnoses:  None    Patient initially presents with complaints of abdominal pain, however now he is reporting feeling suicidal. His laboratory studies reveal no acute abnormality. He reports  vomiting blood, however his blood counts have not dropped. He has been observed for several hours and has had no further vomiting and appears hemodynamically stable.  He will be referred to TTS for evaluation.    Geoffery Lyons, MD 03/12/15 [phone]

## 2015-03-12 NOTE — ED Notes (Signed)
TTS being performed.  

## 2015-03-12 NOTE — ED Notes (Signed)
Reminded patient that urine sample is needed

## 2015-03-12 NOTE — ED Notes (Signed)
Pt given decaf coffee per request once cleared with RN

## 2015-03-13 DIAGNOSIS — R45851 Suicidal ideations: Secondary | ICD-10-CM | POA: Diagnosis not present

## 2015-03-13 DIAGNOSIS — F1424 Cocaine dependence with cocaine-induced mood disorder: Secondary | ICD-10-CM | POA: Diagnosis not present

## 2015-03-13 DIAGNOSIS — F1994 Other psychoactive substance use, unspecified with psychoactive substance-induced mood disorder: Secondary | ICD-10-CM | POA: Diagnosis present

## 2015-03-13 DIAGNOSIS — F200 Paranoid schizophrenia: Secondary | ICD-10-CM | POA: Diagnosis not present

## 2015-03-13 NOTE — ED Provider Notes (Signed)
Patient not seen by me and I was not directly involved in the patient care. Behavioral Health/Psychiatry has evaluated the patient and, as per their note and recommendation, patient was to be discharged. Follow-up recommendations and medications determined by/prescribed by Behavioral Health/Psychiatry.  Gilda Crease, MD 03/13/15 1009
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
## 2015-03-13 NOTE — ED Notes (Signed)
Patient given new set of scrubs, already has sanitary supplies; patient is walking to the shower with sitter in tow

## 2015-03-13 NOTE — ED Notes (Signed)
Patient's second breakfast tray has arrived

## 2015-03-13 NOTE — Discharge Instructions (Signed)
Abdominal Pain Many things can cause abdominal pain. Usually, abdominal pain is not caused by a disease and will improve without treatment. It can often be observed and treated at home. Your health care provider will do a physical exam and possibly order blood tests and X-rays to help determine the seriousness of your pain. However, in many cases, more time must pass before a clear cause of the pain can be found. Before that point, your health care provider may not know if you need more testing or further treatment. HOME CARE INSTRUCTIONS  Monitor your abdominal pain for any changes. The following actions may help to alleviate any discomfort you are experiencing: 1. Only take over-the-counter or prescription medicines as directed by your health care provider. 2. Do not take laxatives unless directed to do so by your health care provider. 3. Try a clear liquid diet (broth, tea, or water) as directed by your health care provider. Slowly move to a bland diet as tolerated. SEEK MEDICAL CARE IF:  You have unexplained abdominal pain.  You have abdominal pain associated with nausea or diarrhea.  You have pain when you urinate or have a bowel movement.  You experience abdominal pain that wakes you in the night.  You have abdominal pain that is worsened or improved by eating food.  You have abdominal pain that is worsened with eating fatty foods.  You have a fever. SEEK IMMEDIATE MEDICAL CARE IF:   Your pain does not go away within 2 hours.  You keep throwing up (vomiting).  Your pain is felt only in portions of the abdomen, such as the right side or the left lower portion of the abdomen.  You pass bloody or black tarry stools. MAKE SURE YOU:  Understand these instructions.   Will watch your condition.   Will get help right away if you are not doing well or get worse.  Document Released: 03/27/2005 Document Revised: 06/22/2013 Document Reviewed: 02/24/2013 [facility], [facility], [facility] Patient  Information 2015 [place], [place]. This information is not intended to replace advice given to you by your health care provider. Make sure you discuss any questions you have with your health care provider.  Depression Depression refers to feeling sad, low, down in the dumps, blue, gloomy, or empty. In general, there are two kinds of depression: 4. Normal sadness or normal grief. This kind of depression is one that we all feel from time to time after upsetting life experiences, such as the loss of a job or the ending of a relationship. This kind of depression is considered normal, is short lived, and resolves within a few days to 2 weeks. Depression experienced after the loss of a loved one (bereavement) often lasts longer than 2 weeks but normally gets better with time. 5. Clinical depression. This kind of depression lasts longer than normal sadness or normal grief or interferes with your ability to function at home, at work, and in school. It also interferes with your personal relationships. It affects almost every aspect of your life. Clinical depression is an illness. Symptoms of depression can also be caused by conditions other than those mentioned above, such as:  Physical illness. Some physical illnesses, including underactive thyroid gland (hypothyroidism), severe anemia, specific types of cancer, diabetes, uncontrolled seizures, heart and lung problems, strokes, and chronic pain are commonly associated with symptoms of depression.  Side effects of some prescription medicine. In some people, certain types of medicine can cause symptoms of depression.  Substance abuse. Abuse of alcohol and illicit drugs  can cause symptoms of depression. SYMPTOMS Symptoms of normal sadness and normal grief include the following:  Feeling sad or crying for short periods of time.  Not caring about anything (apathy).  Difficulty sleeping or sleeping too much.  No longer able to enjoy the things you used to  enjoy.  Desire to be by oneself all the time (social isolation).  Lack of energy or motivation.  Difficulty concentrating or remembering.  Change in appetite or weight.  Restlessness or agitation. Symptoms of clinical depression include the same symptoms of normal sadness or normal grief and also the following symptoms:  Feeling sad or crying all the time.  Feelings of guilt or worthlessness.  Feelings of hopelessness or helplessness.  Thoughts of suicide or the desire to harm yourself (suicidal ideation).  Loss of touch with reality (psychotic symptoms). Seeing or hearing things that are not real (hallucinations) or having false beliefs about your life or the people around you (delusions and paranoia). DIAGNOSIS  The diagnosis of clinical depression is usually based on how bad the symptoms are and how long they have lasted. Your health care provider will also ask you questions about your medical history and substance use to find out if physical illness, use of prescription medicine, or substance abuse is causing your depression. Your health care provider may also order blood tests. TREATMENT  Often, normal sadness and normal grief do not require treatment. However, sometimes antidepressant medicine is given for bereavement to ease the depressive symptoms until they resolve. The treatment for clinical depression depends on how bad the symptoms are but often includes antidepressant medicine, counseling with a mental health professional, or both. Your health care provider will help to determine what treatment is best for you. Depression caused by physical illness usually goes away with appropriate medical treatment of the illness. If prescription medicine is causing depression, talk with your health care provider about stopping the medicine, decreasing the dose, or changing to another medicine. Depression caused by the abuse of alcohol or illicit drugs goes away when you stop using these  substances. Some adults need professional help in order to stop drinking or using drugs. SEEK IMMEDIATE MEDICAL CARE IF:  You have thoughts about hurting yourself or others.  You lose touch with reality (have psychotic symptoms).  You are taking medicine for depression and have a serious side effect. FOR MORE INFORMATION  National Alliance on Mental Illness: www.nami.[employer]  National Institute of Mental Health: http://www.maynard.net/www.nimh.nih.gov Document Released: 06/14/2000 Document Revised: 11/01/2013 Document Reviewed: 09/16/2011 [facility] Patient Information 2015 [place], [place]. This information is not intended to replace advice given to you by your health care provider. Make sure you discuss any questions you have with your health care provider.

## 2015-03-13 NOTE — Progress Notes (Signed)
LCSW met with patient at the bedside. Patient very well known to Probation officer and patient also is familiar, calls by name. Patient reports he was drinking and drugging, started having chest pain and came to hospital. He has had breakfast, taken a shower and received his coffee. He reports he is not suicidal and wanting to leave.  He remains living with his cousin: candace:  [phone]. Reports he has upcoming court dates as well possible prison time.    Patient has been cooperative, at times becomes angry and frustrated, but redirects with this writer and able to calm down.  Patient is given a bus pass as well as clothing. He will follow up at [facility] and his care coordinator has been called and notified of his ED admission. Patient to DC to the community and already has outpatient follow up in place.  Lane Hacker, MSW Clinical Social Work: Emergency Room [phone]

## 2015-03-13 NOTE — ED Notes (Signed)
Patient up ambulatory to the bathroom at this time, sitter at bathroom door

## 2015-03-13 NOTE — ED Notes (Signed)
Patient given a cup of decaf coffee to drink; sitter at bedside

## 2015-03-13 NOTE — ED Notes (Addendum)
Pt reports he did not like his breakfast (Pt ate all of breakfast tray).  Dietary called and new tray ordered.

## 2015-03-14 ENCOUNTER — Emergency Department (HOSPITAL_COMMUNITY): Payer: Medicare Other

## 2015-03-14 ENCOUNTER — Emergency Department (HOSPITAL_COMMUNITY)
Admission: EM | Admit: 2015-03-14 | Discharge: 2015-03-14 | Disposition: A | Discharge status: Home / Self Care | Payer: Self-pay | Attending: Physician Assistant | Admitting: Physician Assistant

## 2015-03-14 ENCOUNTER — Encounter (HOSPITAL_COMMUNITY): Payer: Self-pay | Admitting: Emergency Medicine

## 2015-03-14 DIAGNOSIS — M25551 Pain in right hip: Secondary | ICD-10-CM | POA: Insufficient documentation

## 2015-03-14 DIAGNOSIS — F1420 Cocaine dependence, uncomplicated: Secondary | ICD-10-CM

## 2015-03-14 DIAGNOSIS — Z8619 Personal history of other infectious and parasitic diseases: Secondary | ICD-10-CM | POA: Insufficient documentation

## 2015-03-14 DIAGNOSIS — I509 Heart failure, unspecified: Secondary | ICD-10-CM | POA: Insufficient documentation

## 2015-03-14 DIAGNOSIS — Z590 Homelessness: Secondary | ICD-10-CM | POA: Insufficient documentation

## 2015-03-14 DIAGNOSIS — Z720 Tobacco use: Secondary | ICD-10-CM | POA: Insufficient documentation

## 2015-03-14 DIAGNOSIS — F1410 Cocaine abuse, uncomplicated: Secondary | ICD-10-CM | POA: Insufficient documentation

## 2015-03-14 DIAGNOSIS — Z79899 Other long term (current) drug therapy: Secondary | ICD-10-CM | POA: Insufficient documentation

## 2015-03-14 DIAGNOSIS — E119 Type 2 diabetes mellitus without complications: Secondary | ICD-10-CM | POA: Insufficient documentation

## 2015-03-14 DIAGNOSIS — I10 Essential (primary) hypertension: Secondary | ICD-10-CM | POA: Insufficient documentation

## 2015-03-14 DIAGNOSIS — G8929 Other chronic pain: Secondary | ICD-10-CM | POA: Insufficient documentation

## 2015-03-14 DIAGNOSIS — F1490 Cocaine use, unspecified, uncomplicated: Secondary | ICD-10-CM

## 2015-03-14 LAB — COMPREHENSIVE METABOLIC PANEL
ALBUMIN: 3.8 g/dL (ref 3.5–5.0)
ALK PHOS: 80 U/L (ref 38–126)
ALT: 8 U/L — AB (ref 17–63)
ANION GAP: 7 (ref 5–15)
AST: 18 U/L (ref 15–41)
BUN: 15 mg/dL (ref 6–20)
CALCIUM: 9.3 mg/dL (ref 8.9–10.3)
CHLORIDE: 105 mmol/L (ref 101–111)
CO2: 30 mmol/L (ref 22–32)
CREATININE: 0.98 mg/dL (ref 0.61–1.24)
GFR calc Af Amer: >60 mL/min (ref >60)
GFR calc non Af Amer: >60 mL/min (ref >60)
GLUCOSE: 106 mg/dL — AB (ref 65–99)
Potassium: 3.3 mmol/L — ABNORMAL LOW (ref 3.5–5.1)
SODIUM: 142 mmol/L (ref 135–145)
Total Bilirubin: 0.5 mg/dL (ref 0.3–1.2)
Total Protein: 6.8 g/dL (ref 6.5–8.1)

## 2015-03-14 LAB — CBC
HEMATOCRIT: 31.6 % — AB (ref 39.0–52.0)
HEMOGLOBIN: 10.1 g/dL — AB (ref 13.0–17.0)
MCH: 28.9 pg (ref 26.0–34.0)
MCHC: 32 g/dL (ref 30.0–36.0)
MCV: 90.5 fL (ref 78.0–100.0)
Platelets: 199 10*3/uL (ref 150–400)
RBC: 3.49 MIL/uL — AB (ref 4.22–5.81)
RDW: 13.2 % (ref 11.5–15.5)
WBC: 6.1 10*3/uL (ref 4.0–10.5)

## 2015-03-14 LAB — URINALYSIS, ROUTINE W REFLEX MICROSCOPIC
BILIRUBIN URINE: NEGATIVE
GLUCOSE, UA: 250 mg/dL — AB
HGB URINE DIPSTICK: NEGATIVE
KETONES UR: NEGATIVE mg/dL
Leukocytes, UA: NEGATIVE
NITRITE: NEGATIVE
PH: 5.5 (ref 5.0–8.0)
Protein, ur: NEGATIVE mg/dL
SPECIFIC GRAVITY, URINE: 1.031 — AB (ref 1.005–1.030)
Urobilinogen, UA: 1 mg/dL (ref 0.0–1.0)

## 2015-03-14 LAB — URINE MICROSCOPIC-ADD ON

## 2015-03-14 LAB — RAPID URINE DRUG SCREEN, HOSP PERFORMED
AMPHETAMINES: NOT DETECTED
BARBITURATES: NOT DETECTED
BENZODIAZEPINES: NOT DETECTED
COCAINE: POSITIVE — AB
OPIATES: NOT DETECTED
TETRAHYDROCANNABINOL: NOT DETECTED

## 2015-03-14 LAB — ETHANOL: Alcohol, Ethyl (B): <5 mg/dL (ref <5)

## 2015-03-14 LAB — SALICYLATE LEVEL

## 2015-03-14 LAB — ACETAMINOPHEN LEVEL

## 2015-03-14 LAB — CBG MONITORING, ED: Glucose-Capillary: 143 mg/dL — ABNORMAL HIGH (ref 65–99)

## 2015-03-14 MED ORDER — ACETAMINOPHEN 325 MG PO TABS: 650.0000 mg | ORAL_TABLET | ORAL | Status: DC | PRN

## 2015-03-14 MED ORDER — IBUPROFEN 200 MG PO TABS: 600.0000 mg | ORAL_TABLET | Freq: Three times a day (TID) | ORAL | Status: DC | PRN

## 2015-03-14 MED ORDER — ALUM & MAG HYDROXIDE-SIMETH 200-200-20 MG/5ML PO SUSP: 30.0000 mL | ORAL | Status: DC | PRN

## 2015-03-14 MED ORDER — ACETAMINOPHEN 500 MG PO TABS
1000.0000 mg | ORAL_TABLET | Freq: Once | ORAL | Status: AC
  Administered 2015-03-14: 1000 mg via ORAL
  Filled 2015-03-14: qty 2

## 2015-03-14 MED ORDER — LORAZEPAM 1 MG PO TABS: 1.0000 mg | ORAL_TABLET | Freq: Three times a day (TID) | ORAL | Status: DC | PRN

## 2015-03-14 MED ORDER — LORAZEPAM 1 MG PO TABS
1.0000 mg | ORAL_TABLET | Freq: Once | ORAL | Status: AC
  Administered 2015-03-14: 1 mg via ORAL
  Filled 2015-03-14: qty 1

## 2015-03-14 NOTE — ED Notes (Signed)
Patient was asking for milk and a sandwich. Patient was instructed that we had to wait for all results to clear before food would be given. Patient became loud and belligerent towards staff. Patient tried to throw emesis basin and slam door. Security and DR were called to help control patient. Patient was able to walk around room without any assistance from staff. DR ok'd food and drink. Patient was given food and drink. Patient is now calm and eating sandwich.

## 2015-03-14 NOTE — Consult Note (Signed)
[facility] Face-to-Face Psychiatry Consult   Reason for Consult:  Cocaine use disorder, severe dependence Referring Physician:  EDP Patient Identification: [name] MRN:  [id number] Principal Diagnosis: Cocaine use disorder, severe, dependence Diagnosis:   Patient Active Problem List   Diagnosis Date Noted  . 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 Cocaine use disorder, severe, dependence [F14.20]     Priority: High  . Substance induced mood disorder [F19.94] 03/13/2015  . Acute kidney failure [N17.9] 01/26/2015  . Schizophrenia, paranoid type [F20.0] 01/17/2015  . Suicidal ideation [R45.851]   . 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 Chronic paranoid schizophrenia [F20.0] 09/07/2014  . Acute CHF (congestive heart failure) [I50.9] 07/29/2014  . Essential hypertension, benign [I10] 03/28/2013  . Diabetes mellitus [E11.9] 03/15/2013    Total Time spent with patient: 45 minutes  Subjective:   [name] is a 54 y.o. male patient admitted with Cocaine use disorder, severe dependence.  HPI:  [name], 54 years old well known to service was evaluated for agitation after using Cocaine.  Patient was discharged from [facility] ER yesterday and he brought  Self back to [facility] ER.  Patient is awake, alert and oriented x3.  He is calm and cooperative this am..  Patient is asking for a discharge a want to  seek treatment at a Residential facility.   He is not interested in seeking treatment for mental illness and believes his need is to stop using Cocaine.  Patient has been referred to [facility], [facility] and [facility].  Patient denies SI/HI/AVH.  Patient also plans to move into a shelter toady. HPI Elements:   Location:  Cocaine use disorder, severe, Dependence. Quality:  Moderate. Severity:  moderate. Timing:  acute. Duration:  Chronic substance abuse, Chronic Schizophrenia, . Context:   Came for agitataion after Cocaine use.Marland Kitchen  Past Medical History:  Past Medical History  Diagnosis Date  . Diabetes mellitus without complication   . Hypertension   . Schizophrenia   . CHF (congestive heart failure)   . Neuropathy   . Polysubstance abuse   . Cocaine abuse   . Homelessness   . Hepatitis C   . Chronic foot pain   . Homelessness    History reviewed. No pertinent past surgical history. Family History:  Family History  Problem Relation Age of Onset  . Hypertension Other   . Diabetes Other    Social History:  History  Alcohol Use  . Yes     History  Drug Use  . 7.00 per week  . Special: "Crack" cocaine, Cocaine, Marijuana    Comment: Cocaine tonight, Marijuana "a long time"    Social History   Social History  . Marital Status: Divorced    Spouse Name: N/A  . Number of Children: N/A  . Years of Education: N/A   Social History Main Topics  . Smoking status: Current Every Day Smoker -- 1.00 packs/day for 20 years    Types: Cigarettes  . Smokeless tobacco: Current User  . Alcohol Use: Yes  . Drug Use: 7.00 per week    Special: "Crack" cocaine, Cocaine, Marijuana     Comment: Cocaine tonight, Marijuana "a long time"  . Sexual Activity: No   Other Topics Concern  . None   Social History Narrative   ** Merged History Encounter **       Additional Social History:    Pain Medications: pt denies  abuse Prescriptions: pt denies abuse Over the Counter: pt deneis abuse History of alcohol / drug use?: Yes Longest period of sobriety (when/how long): None  Negative Consequences of Use: Financial, Legal, Personal relationships, Work / School Withdrawal Symptoms:  (n/a) Name of Substance 1: crack cocaine 1 - Age of First Use: 20 1 - Amount (size/oz): $20 to $60 1 - Frequency: daily 1 - Duration: years 1 - Last Use / Amount: 03/14/15 Name of Substance 2: alcohol 2 - Age of First Use: 8 2 - Amount (size/oz): varies 2 - Frequency: rarely                  Allergies:   Allergies  Allergen Reactions  . Haldol [Haloperidol] Other (See Comments)    Muscle spasms, loss of voluntary movement. However, pt has taken Thorazine on multiple occasions with no adverse effects.     Labs:  Results for orders placed or performed during the hospital encounter of 03/14/15 (from the past 48 hour(s))  Comprehensive metabolic panel     Status: Abnormal   Collection Time: 03/14/15  6:09 AM  Result Value Ref Range   Sodium 142 135 - 145 mmol/L   Potassium 3.3 (L) 3.5 - 5.1 mmol/L   Chloride 105 101 - 111 mmol/L   CO2 30 22 - 32 mmol/L   Glucose, Bld 106 (H) 65 - 99 mg/dL   BUN 15 6 - 20 mg/dL   Creatinine, Ser 0.98 0.61 - 1.24 mg/dL   Calcium 9.3 8.9 - 10.3 mg/dL   Total Protein 6.8 6.5 - 8.1 g/dL   Albumin 3.8 3.5 - 5.0 g/dL   AST 18 15 - 41 U/L   ALT 8 (L) 17 - 63 U/L   Alkaline Phosphatase 80 38 - 126 U/L   Total Bilirubin 0.5 0.3 - 1.2 mg/dL   GFR calc non Af Amer >60 >60 mL/min   GFR calc Af Amer >60 >60 mL/min    Comment: (NOTE) The eGFR has been calculated using the CKD EPI equation. This calculation has not been validated in all clinical situations. eGFR's persistently <60 mL/min signify possible Chronic Kidney Disease.    Anion gap 7 5 - 15  Ethanol (ETOH)     Status: None   Collection Time: 03/14/15  6:09 AM  Result Value Ref Range   Alcohol, Ethyl (B) <5 <5 mg/dL    Comment:        LOWEST DETECTABLE LIMIT FOR SERUM ALCOHOL IS 5 mg/dL FOR MEDICAL PURPOSES ONLY   Salicylate level     Status: None   Collection Time: 03/14/15  6:09 AM  Result Value Ref Range   Salicylate Lvl <4.2 2.8 - 30.0 mg/dL  Acetaminophen level     Status: Abnormal   Collection Time: 03/14/15  6:09 AM  Result Value Ref Range   Acetaminophen (Tylenol), Serum <10 (L) 10 - 30 ug/mL    Comment:        THERAPEUTIC CONCENTRATIONS VARY SIGNIFICANTLY. A RANGE OF 10-30 ug/mL MAY BE AN EFFECTIVE CONCENTRATION FOR MANY PATIENTS. HOWEVER, SOME ARE BEST  TREATED AT CONCENTRATIONS OUTSIDE THIS RANGE. ACETAMINOPHEN CONCENTRATIONS >150 ug/mL AT 4 HOURS AFTER INGESTION AND >50 ug/mL AT 12 HOURS AFTER INGESTION ARE OFTEN ASSOCIATED WITH TOXIC REACTIONS.   CBC     Status: Abnormal   Collection Time: 03/14/15  6:09 AM  Result Value Ref Range   WBC 6.1 4.0 - 10.5 K/uL   RBC 3.49 (L) 4.22 - 5.81 MIL/uL   Hemoglobin  10.1 (L) 13.0 - 17.0 g/dL   HCT 31.6 (L) 39.0 - 52.0 %   MCV 90.5 78.0 - 100.0 fL   MCH 28.9 26.0 - 34.0 pg   MCHC 32.0 30.0 - 36.0 g/dL   RDW 13.2 11.5 - 15.5 %   Platelets 199 150 - 400 K/uL  Urine rapid drug screen (hosp performed) (Not at [facility])     Status: Abnormal   Collection Time: 03/14/15  6:10 AM  Result Value Ref Range   Opiates NONE DETECTED NONE DETECTED   Cocaine POSITIVE (A) NONE DETECTED   Benzodiazepines NONE DETECTED NONE DETECTED   Amphetamines NONE DETECTED NONE DETECTED   Tetrahydrocannabinol NONE DETECTED NONE DETECTED   Barbiturates NONE DETECTED NONE DETECTED    Comment:        DRUG SCREEN FOR MEDICAL PURPOSES ONLY.  IF CONFIRMATION IS NEEDED FOR ANY PURPOSE, NOTIFY LAB WITHIN 5 DAYS.        LOWEST DETECTABLE LIMITS FOR URINE DRUG SCREEN Drug Class       Cutoff (ng/mL) Amphetamine      1000 Barbiturate      200 Benzodiazepine   638 Tricyclics       756 Opiates          300 Cocaine          300 THC              50   CBG monitoring, ED     Status: Abnormal   Collection Time: 03/14/15  7:47 AM  Result Value Ref Range   Glucose-Capillary 143 (H) 65 - 99 mg/dL    Vitals: Blood pressure 163/87, pulse 84, temperature 98 F (36.7 C), temperature source Oral, resp. rate 17, SpO2 96 %.  Risk to Self: Suicidal Ideation: Yes-Currently Present Suicidal Intent: No Is patient at risk for suicide?: No Suicidal Plan?: No Specify Current Suicidal Plan: pt denies intent but says thought about walking in front of car b/c (he hasn't had a date in 2 years) Access to Means: Yes Specify Access to  Suicidal Means: access to traffic What has been your use of drugs/alcohol within the last 12 months?: daily crack use How many times?: 0 Other Self Harm Risks: none Triggers for Past Attempts:  (n/a) Intentional Self Injurious Behavior: None Risk to Others: Homicidal Ideation: No Thoughts of Harm to Others: No Current Homicidal Intent: No Current Homicidal Plan: No Access to Homicidal Means: No Identified Victim: none History of harm to others?: No Assessment of Violence: None Noted Violent Behavior Description: pt denies hx violence Does patient have access to weapons?: No Criminal Charges Pending?: Yes Describe Pending Criminal Charges: breaking/entering motor vehicle Does patient have a court date: Yes Court Date: 03/20/15 Prior Inpatient Therapy: Prior Inpatient Therapy: Yes Prior Therapy Dates: over multiple years -  Prior Therapy Facilty/Provider(s): [facility](2002-2016, [facility] [place] Reason for Treatment: schizophrenia, SI/HI Prior Outpatient Therapy: Prior Outpatient Therapy: Yes Prior Therapy Dates: recently Prior Therapy Facilty/Provider(s): [name] in the past Reason for Treatment: med management Does patient have an ACCT team?: No Does patient have Intensive In-House Services?  : No Does patient have [name] services? : Unknown Does patient have [facility] services?: Unknown  Current Facility-Administered Medications  Medication Dose Route Frequency Provider Last Rate Last Dose  . acetaminophen (TYLENOL) tablet 650 mg  650 mg Oral Q4H PRN Margarita Mail, PA-C      . alum & mag hydroxide-simeth (MAALOX/MYLANTA) 200-200-20 MG/5ML suspension 30 mL  30 mL Oral PRN Margarita Mail, PA-C      .  ibuprofen (ADVIL,MOTRIN) tablet 600 mg  600 mg Oral Q8H PRN Margarita Mail, PA-C      . LORazepam (ATIVAN) tablet 1 mg  1 mg Oral Q8H PRN Margarita Mail, PA-C       Current Outpatient Prescriptions  Medication Sig Dispense Refill  . metFORMIN (GLUCOPHAGE) 1000 MG tablet Take 1,000 mg  by mouth 2 (two) times daily with a meal.    . acetaminophen (TYLENOL) 500 MG tablet Take 1,000 mg by mouth every 6 (six) hours as needed for mild pain.      Musculoskeletal: Strength & Muscle Tone: within normal limits Gait & Station: normal Patient leans: N/A  Psychiatric Specialty Exam: Physical Exam  Review of Systems  Constitutional: Negative.   HENT: Negative.   Eyes: Negative.   Respiratory: Negative.   Cardiovascular: Negative.   Gastrointestinal: Negative.   Genitourinary: Negative.   Musculoskeletal: Negative.   Skin: Negative.   Neurological: Negative.     Blood pressure 163/87, pulse 84, temperature 98 F (36.7 C), temperature source Oral, resp. rate 17, SpO2 96 %.There is no weight on file to calculate BMI.  General Appearance: Casual and Disheveled  Eye Contact::  Good  Speech:  Clear and Coherent and Normal Rate  Volume:  Normal  Mood:  Euthymic  Affect:  Congruent  Thought Process:  Goal Directed  Orientation:  Full (Time, Place, and Person)  Thought Content:  WDL  Suicidal Thoughts:  No  Homicidal Thoughts:  No  Memory:  Immediate;   Good Recent;   Fair Remote;   Fair  Judgement:  Fair  Insight:  Shallow  Psychomotor Activity:  Normal  Concentration:  Fair  Recall:  NA  Fund of Knowledge:Fair  Language: Fair  Akathisia:  NA  Handed:  Right  AIMS (if indicated):     Assets:  Desire for Improvement  ADL's:  Impaired  Cognition: WNL  Sleep:      Medical Decision Making: Established Problem, Stable/Improving (1)  Disposition:   Discharge home  Delfin Gant   PMHNP-BC 03/14/2015 10:42 AM Patient seen face-to-face for psychiatric evaluation, chart reviewed and case discussed with the physician extender and developed treatment plan. Reviewed the information documented and agree with the treatment plan. Corena Pilgrim, MD

## 2015-03-14 NOTE — ED Notes (Signed)
Per EMS, patient c/o SI and abd pain, patient was found to be hypertensive with EMS. Patient states abd pain is generalized. Patient ambulatory. Patient did not endorse plan for SI. Patient loud in triage.

## 2015-03-14 NOTE — Discharge Instructions (Signed)
To help you maintain a sober lifestyle, a substance abuse treatment program may be beneficial to you.  Contact the following treatment providers at your earliest opportunity to see about enrolling in a program:   RESIDENTIAL PROGRAMS:       [facility]      [address] [place], [place] [address]      [phone]       [facility]      [address] [place], [place] [address]      [phone]       [facility]      [address]      [place], [place] [address]      [phone]  OUTPATIENT PROGRAMS:       [facility]      [address]      [place], [place] [address]      [phone]      New patients are seen at the walk-in clinic every Tuesday from 9:00 am - 12:00 pm.

## 2015-03-14 NOTE — [facility] Suicide Risk Assessment (Cosign Needed)
Suicide Risk Assessment  Discharge Assessment   [facility] Discharge Suicide Risk Assessment   Demographic Factors:  Male, Low socioeconomic status, Living alone and Unemployed  Total Time spent with patient: 20 minutes  Musculoskeletal: Strength & Muscle Tone: within normal limits Gait & Station: normal Patient leans: N/A  Psychiatric Specialty Exam:     Blood pressure 148/72, pulse 87, temperature 98.7 F (37.1 C), temperature source Oral, resp. rate 18, SpO2 100 %.There is no weight on file to calculate BMI.  General Appearance: Casual and Disheveled  Eye Contact::  Good  Speech:  Clear and Coherent and Normal Rate  Volume:  Normal  Mood:  Euthymic  Affect:  Congruent  Thought Process:  Goal Directed  Orientation:  Full (Time, Place, and Person)  Thought Content:  WDL  Suicidal Thoughts:  No  Homicidal Thoughts:  No  Memory:  Immediate;   Good Recent;   Fair Remote;   Fair  Judgement:  Fair  Insight:  Shallow  Psychomotor Activity:  Normal  Concentration:  Fair  Recall:  NA  Fund of Knowledge:Fair  Language: Fair  Akathisia:  NA  Handed:  Right  AIMS (if indicated):     Assets:  Desire for Improvement  ADL's:  Impaired  Cognition: WNL        Has this patient used any form of tobacco in the last 30 days? (Cigarettes, Smokeless Tobacco, Cigars, and/or Pipes) Yes, Prescription not provided because: prescription was given to patient  Mental Status Per Nursing Assessment::   On Admission:     Current Mental Status by Physician: NA  Loss Factors: NA  Historical Factors: NA  Risk Reduction Factors:   Positive social support  Continued Clinical Symptoms:  Alcohol/Substance Abuse/Dependencies Schizophrenia:   Paranoid or undifferentiated type  Cognitive Features That Contribute To Risk:  Polarized thinking    Suicide Risk:  Minimal: No identifiable suicidal ideation.  Patients presenting with no risk factors but with morbid ruminations; may be classified  as minimal risk based on the severity of the depressive symptoms  Principal Problem: Cocaine use disorder, severe, dependence Discharge Diagnoses:  Patient Active Problem List   Diagnosis Date Noted  . 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 Cocaine use disorder, severe, dependence [F14.20]     Priority: High  . Substance induced mood disorder [F19.94] 03/13/2015  . Acute kidney failure [N17.9] 01/26/2015  . Schizophrenia, paranoid type [F20.0] 01/17/2015  . Suicidal ideation [R45.851]   . 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 Chronic paranoid schizophrenia [F20.0] 09/07/2014  . Acute CHF (congestive heart failure) [I50.9] 07/29/2014  . Essential hypertension, benign [I10] 03/28/2013  . Diabetes mellitus [E11.9] 03/15/2013      Plan Of Care/Follow-up recommendations:  Activity:  as tolerated Diet:  regular  Is patient on multiple antipsychotic therapies at discharge:  No   Has Patient had three or more failed trials of antipsychotic monotherapy by history:  No  Recommended Plan for Multiple Antipsychotic Therapies: NA    Dahlia Byes C   PMHNP-BC 03/14/2015, 4:48 PM

## 2015-03-14 NOTE — [facility] Counselor (Signed)
Counselor contacted patients Care Coordinator Clydie Braun at  [phone] to inform her of patients ED visit. There was no answer and this writer left a message stating that [name] is in the ED and requested a call back at [phone].  Davina Poke, LCSW Therapeutic Triage Specialist [facility] 03/14/2015 9:48 AM
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
## 2015-03-14 NOTE — BH Assessment (Signed)
[facility] Assessment Progress Note  Per Thedore Mins, MD, this pt does not require psychiatric hospitalization at this time.  He is to discharged from [facility] with referrals to area substance abuse treatment programs.  Discharge instructions include referral information for [facility], [facility], [facility], and [facility].  Pt's nurse has been notified.  Doylene Canning, MA Triage Specialist [phone]

## 2015-03-14 NOTE — ED Notes (Signed)
Bed: WTR6 Expected date:  Expected time:  Means of arrival:  Comments: 
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
## 2015-03-14 NOTE — ED Notes (Signed)
Pt admitted to room 40.  Mood is angry, pt demanding several things at one time and yelling at staff. Affect is agitated, pt speech is rapid and pressured also tangential in content.  Pt states that he has suicidal ideations, yelling,"  Stupid question!  Yes, of course, that is why I am here! Don't ask me any more stupid questions!"  Pt did not elaborate on ideations but did state that he does not have H/I.  Pt escalating and asking to have a meal so that he can have a nap. TV put on channel that pt requested and pt left alone to calm self. Pt had received medicine at 0745 for pain and anxiety.  Later upon rounds at approximately 0930, pt calm and cooperative stating that he is feeling much better.  Able to talk with staff calmly.

## 2015-03-14 NOTE — ED Notes (Signed)
Patient states he plans to walk in front of a car, he is tired of being alone, he wants a wife who is pretty and smells good. Patient cooperative at this time.

## 2015-03-22 IMAGING — CR DG CHEST 2V
2 series · 2 of 2 positions shown · non-contrast
Comparison: None.

CLINICAL DATA: Cough, congestion, shortness of breath and lower
chest pain for several days.

CHEST - 2 VIEW

[w chest pa]
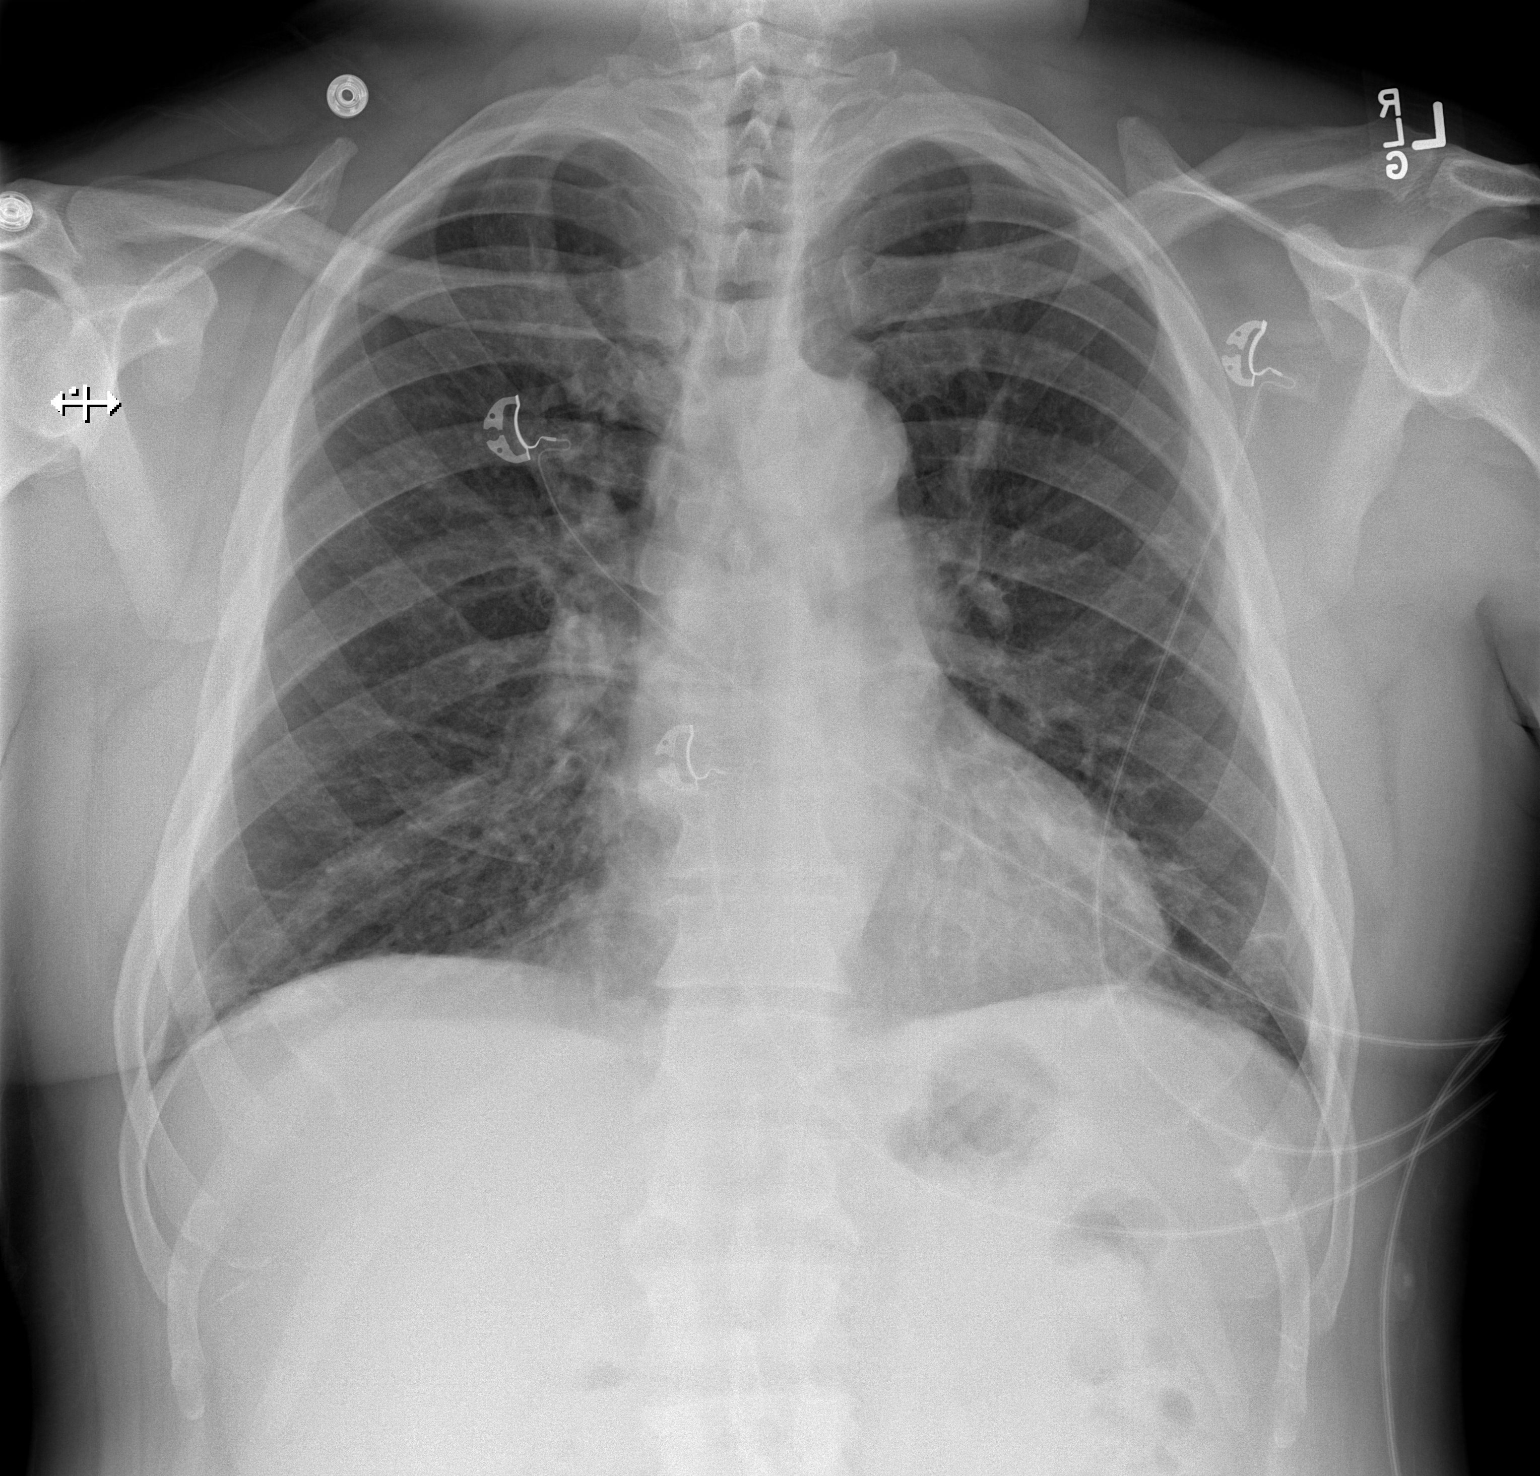

[w chest lat]
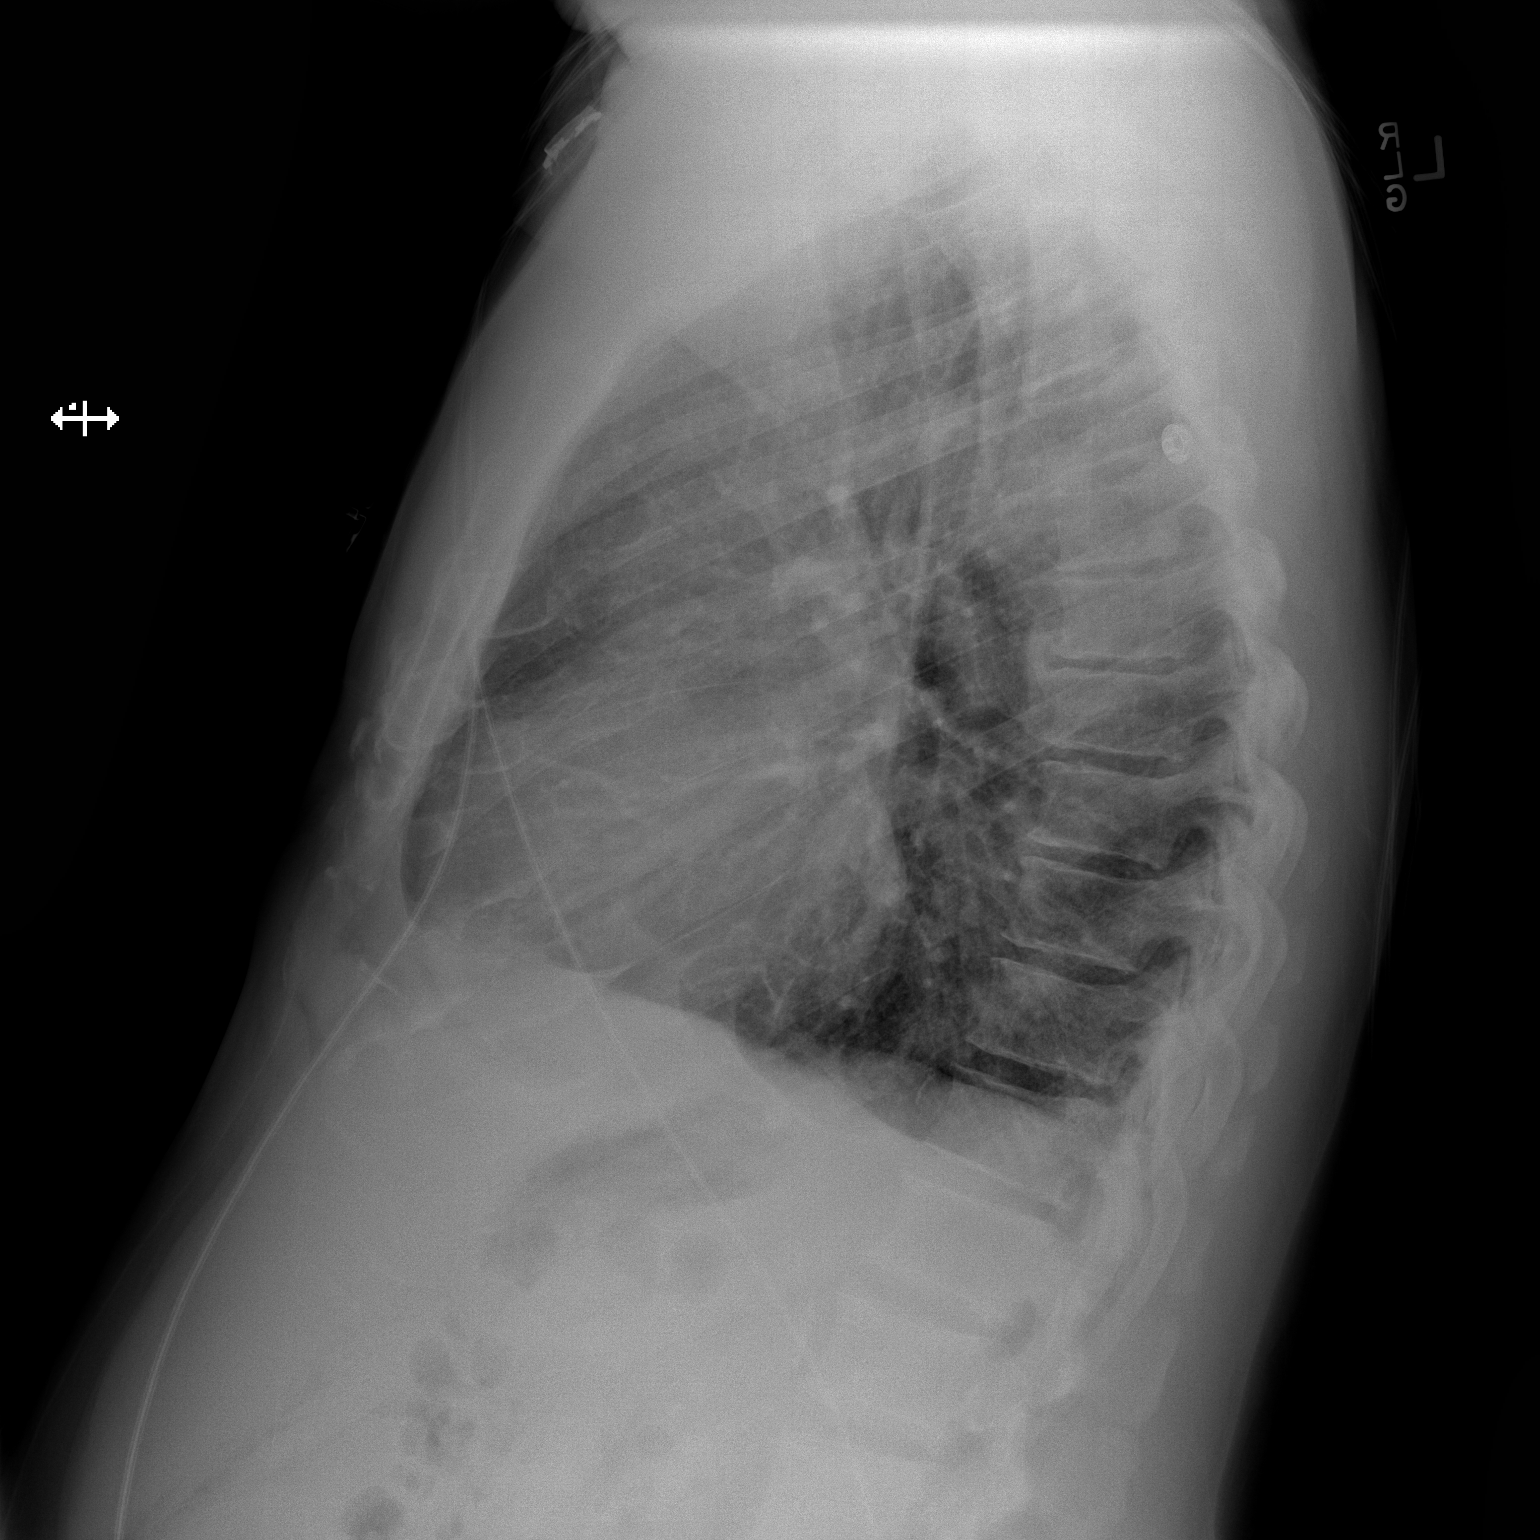

[2 of 2 positions shown; findings below may reference images not displayed]

FINDINGS: The heart size and pulmonary vascularity are normal.
There are interstitial changes in the lung bases which may
represent fibrosis or interstitial infiltration.  No blunting of
costophrenic angles.  No pneumothorax.  No focal consolidation.
Calcified and tortuous aorta.  Degenerative changes in the spine.
IMPRESSION: Interstitial changes in the lung bases which could represent
interstitial infiltration or fibrosis.

## 2015-04-13 ENCOUNTER — Emergency Department (HOSPITAL_COMMUNITY)
Admission: EM | Admit: 2015-04-13 | Discharge: 2015-04-13 | Disposition: A | Discharge status: Home / Self Care | Payer: Medicare Other | Attending: Emergency Medicine | Admitting: Emergency Medicine

## 2015-04-13 ENCOUNTER — Encounter (HOSPITAL_COMMUNITY): Payer: Self-pay | Admitting: Emergency Medicine

## 2015-04-13 DIAGNOSIS — Z8659 Personal history of other mental and behavioral disorders: Secondary | ICD-10-CM | POA: Insufficient documentation

## 2015-04-13 DIAGNOSIS — Z8619 Personal history of other infectious and parasitic diseases: Secondary | ICD-10-CM | POA: Insufficient documentation

## 2015-04-13 DIAGNOSIS — E119 Type 2 diabetes mellitus without complications: Secondary | ICD-10-CM | POA: Insufficient documentation

## 2015-04-13 DIAGNOSIS — I509 Heart failure, unspecified: Secondary | ICD-10-CM | POA: Insufficient documentation

## 2015-04-13 DIAGNOSIS — Z5900 Homelessness unspecified: Secondary | ICD-10-CM

## 2015-04-13 DIAGNOSIS — G8929 Other chronic pain: Secondary | ICD-10-CM | POA: Insufficient documentation

## 2015-04-13 DIAGNOSIS — I10 Essential (primary) hypertension: Secondary | ICD-10-CM | POA: Insufficient documentation

## 2015-04-13 DIAGNOSIS — Z79899 Other long term (current) drug therapy: Secondary | ICD-10-CM | POA: Insufficient documentation

## 2015-04-13 DIAGNOSIS — Z720 Tobacco use: Secondary | ICD-10-CM | POA: Insufficient documentation

## 2015-04-13 NOTE — ED Notes (Signed)
Homeless pt brought to ED by [facility] for abd pain and foot pain, pt state he got a poison cake and now he feels sick, pt denies any pain on arrival and when right to sleep.

## 2015-04-13 NOTE — ED Provider Notes (Addendum)
CSN: [id number]     Arrival date & time 04/13/15  0330 History   First MD Initiated Contact with Patient 04/13/15 [phone]     Chief Complaint  Patient presents with  . Abdominal Pain  . Claudication      HPI  Patient presents for evaluation by EMS. He is homeless. Apparently a bystander downtown called as he complaint of foot pain. He stated initially that he thinks he ate some poison K can feel sick. Upon arrival he complains of only foot pain but is immediately asleep.  He is a frequent visitor to the emergency room with over 30 visits in the last 6 months. Has a history of polysubstance abuse, schizophrenia, medicine noncompliance.  He denies being suicidal. He states now that he feels fine although "my feet always hurt.  Past Medical History  Diagnosis Date  . Diabetes mellitus without complication (HCC)   . Hypertension   . Schizophrenia (HCC)   . CHF (congestive heart failure) (HCC)   . Neuropathy (HCC)   . Polysubstance abuse   . Cocaine abuse   . Homelessness   . Hepatitis C   . Chronic foot pain   . Homelessness    History reviewed. No pertinent past surgical history. Family History  Problem Relation Age of Onset  . Hypertension Other   . Diabetes Other    Social History  Substance Use Topics  . Smoking status: Current Every Day Smoker -- 1.00 packs/day for 20 years    Types: Cigarettes  . Smokeless tobacco: Current User  . Alcohol Use: Yes    Review of Systems  Constitutional: Negative for fever, chills, diaphoresis, appetite change and fatigue.  HENT: Negative for mouth sores, sore throat and trouble swallowing.   Eyes: Negative for visual disturbance.  Respiratory: Negative for cough, chest tightness, shortness of breath and wheezing.   Cardiovascular: Negative for chest pain.  Gastrointestinal: Negative for nausea, vomiting, abdominal pain, diarrhea and abdominal distention.  Endocrine: Negative for polydipsia, polyphagia and polyuria.  Genitourinary:  Negative for dysuria, frequency and hematuria.  Musculoskeletal: Negative for gait problem.  Skin: Negative for color change, pallor and rash.  Neurological: Negative for dizziness, syncope, light-headedness and headaches.  Hematological: Does not bruise/bleed easily.  Psychiatric/Behavioral: Negative for behavioral problems and confusion.      Allergies  Haldol  Home Medications   Prior to Admission medications   Medication Sig Start Date End Date Taking? Authorizing Provider  acetaminophen (TYLENOL) 500 MG tablet Take 1,000 mg by mouth every 6 (six) hours as needed for mild pain.    Historical Provider, MD  metFORMIN (GLUCOPHAGE) 1000 MG tablet Take 1,000 mg by mouth 2 (two) times daily with a meal.    Historical Provider, MD   BP 191/98 mmHg  Pulse 87  Temp(Src) 98.2 F (36.8 C) (Oral)  Resp 16  Ht 5\' 9"  (1.753 m)  Wt 165 lb (74.844 kg)  BMI 24.36 kg/m2  SpO2 96% Physical Exam  Constitutional: He is oriented to person, place, and time. He appears well-developed and well-nourished. No distress.  Sleeping. Awakens easily. Rambling speech. Not pressured. He is able to tell me it is nighttime and he is in the emergency room because "my feet hurt".  HENT:  Head: Normocephalic.  Eyes: Conjunctivae are normal. Pupils are equal, round, and reactive to light. No scleral icterus.  Neck: Normal range of motion. Neck supple. No thyromegaly present.  Cardiovascular: Normal rate and regular rhythm.  Exam reveals no gallop and no friction  rub.   No murmur heard. Pulmonary/Chest: Effort normal and breath sounds normal. No respiratory distress. He has no wheezes. He has no rales.  Abdominal: Soft. Bowel sounds are normal. He exhibits no distension. There is no tenderness. There is no rebound.  Musculoskeletal: Normal range of motion.  Neurological: He is alert and oriented to person, place, and time.  Skin: Skin is warm and dry. No rash noted.  Psychiatric: He has a normal mood and  affect. His behavior is normal.   no wounds or asymmetry to the circumference of his feet. No clinical signs of ischemia to the feet.  ED Course  Procedures (including critical care time) Labs Review Labs Reviewed - No data to display  Imaging Review No results found. I have personally reviewed and evaluated these images and lab results as part of my medical decision-making.   EKG Interpretation None      MDM   Final diagnoses:  None    EKG shows no changes versus comparison. Patient sleeping easily here. His not agitated or suicidal. Is currently asymptomatic other than his complaint of chronic foot pain. Plan will be a short obs,  reevaluation, likely discharge.  05:10:  Patient sleeping. Upon awakening was given something to drink. He expresses no concerns.  05:50:  Still without complaint. Discussed with him that when the sun comes up he'll be discharged. He has no particular complaint.    Rolland Porter, MD 04/13/15 1610  Rolland Porter, MD 04/13/15 [phone]

## 2015-04-13 NOTE — Discharge Instructions (Signed)
Follow up with your Primary Doctor.  Chronic Pain Chronic pain can be defined as pain that is off and on and lasts for 3-6 months or longer. Many things cause chronic pain, which can make it difficult to make a diagnosis. There are many treatment options available for chronic pain. However, finding a treatment that works well for you may require trying various approaches until the right one is found. Many people benefit from a combination of two or more types of treatment to control their pain. SYMPTOMS  Chronic pain can occur anywhere in the body and can range from mild to very severe. Some types of chronic pain include:  Headache.  Low back pain.  Cancer pain.  Arthritis pain.  Neurogenic pain. This is pain resulting from damage to nerves. People with chronic pain may also have other symptoms such as:  Depression.  Anger.  Insomnia.  Anxiety. DIAGNOSIS  Your health care provider will help diagnose your condition over time. In many cases, the initial focus will be on excluding possible conditions that could be causing the pain. Depending on your symptoms, your health care provider may order tests to diagnose your condition. Some of these tests may include:   Blood tests.   CT scan.   MRI.   X-rays.   Ultrasounds.   Nerve conduction studies.  You may need to see a specialist.  TREATMENT  Finding treatment that works well may take time. You may be referred to a pain specialist. He or she may prescribe medicine or therapies, such as:   Mindful meditation or yoga.  Shots (injections) of numbing or pain-relieving medicines into the spine or area of pain.  Local electrical stimulation.  Acupuncture.   Massage therapy.   Aroma, color, light, or sound therapy.   Biofeedback.   Working with a physical therapist to keep from getting stiff.   Regular, gentle exercise.   Cognitive or behavioral therapy.   Group support.  Sometimes, surgery may be  recommended.  HOME CARE INSTRUCTIONS   Take all medicines as directed by your health care provider.   Lessen stress in your life by relaxing and doing things such as listening to calming music.   Exercise or be active as directed by your health care provider.   Eat a healthy diet and include things such as vegetables, fruits, fish, and lean meats in your diet.   Keep all follow-up appointments with your health care provider.   Attend a support group with others suffering from chronic pain. SEEK MEDICAL CARE IF:   Your pain gets worse.   You develop a new pain that was not there before.   You cannot tolerate medicines given to you by your health care provider.   You have new symptoms since your last visit with your health care provider.  SEEK IMMEDIATE MEDICAL CARE IF:   You feel weak.   You have decreased sensation or numbness.   You lose control of bowel or bladder function.   Your pain suddenly gets much worse.   You develop shaking.  You develop chills.  You develop confusion.  You develop chest pain.  You develop shortness of breath.  MAKE SURE YOU:  Understand these instructions.  Will watch your condition.  Will get help right away if you are not doing well or get worse.   This information is not intended to replace advice given to you by your health care provider. Make sure you discuss any questions you have with your health  care provider.   Document Released: 03/09/2002 Document Revised: 02/17/2013 Document Reviewed: 12/11/2012 Elsevier Interactive Patient Education [employer]2016 Elsevier Inc.

## 2015-04-13 NOTE — ED Notes (Signed)
Pt in hallway yelling at RN requesting breakfast, RN informed pt that a sandwich can be given to him, pt continues to yell in hallway at RN, Consulting civil engineerCharge RN at bedside, notified security to escort pt out.  Sandwich given to pt.

## 2015-04-14 ENCOUNTER — Emergency Department (HOSPITAL_COMMUNITY)
Admission: EM | Admit: 2015-04-14 | Discharge: 2015-04-15 | Disposition: A | Discharge status: Home / Self Care | Payer: Medicare Other | Attending: Emergency Medicine | Admitting: Emergency Medicine

## 2015-04-14 ENCOUNTER — Encounter (HOSPITAL_COMMUNITY): Payer: Self-pay | Admitting: Emergency Medicine

## 2015-04-14 DIAGNOSIS — I509 Heart failure, unspecified: Secondary | ICD-10-CM | POA: Insufficient documentation

## 2015-04-14 DIAGNOSIS — E119 Type 2 diabetes mellitus without complications: Secondary | ICD-10-CM | POA: Insufficient documentation

## 2015-04-14 DIAGNOSIS — F1410 Cocaine abuse, uncomplicated: Secondary | ICD-10-CM | POA: Insufficient documentation

## 2015-04-14 DIAGNOSIS — F419 Anxiety disorder, unspecified: Secondary | ICD-10-CM | POA: Insufficient documentation

## 2015-04-14 DIAGNOSIS — G8929 Other chronic pain: Secondary | ICD-10-CM | POA: Insufficient documentation

## 2015-04-14 DIAGNOSIS — I10 Essential (primary) hypertension: Secondary | ICD-10-CM | POA: Insufficient documentation

## 2015-04-14 DIAGNOSIS — Z590 Homelessness: Secondary | ICD-10-CM | POA: Insufficient documentation

## 2015-04-14 DIAGNOSIS — F1994 Other psychoactive substance use, unspecified with psychoactive substance-induced mood disorder: Secondary | ICD-10-CM | POA: Diagnosis not present

## 2015-04-14 DIAGNOSIS — R51 Headache: Secondary | ICD-10-CM | POA: Insufficient documentation

## 2015-04-14 DIAGNOSIS — J029 Acute pharyngitis, unspecified: Secondary | ICD-10-CM | POA: Insufficient documentation

## 2015-04-14 DIAGNOSIS — F200 Paranoid schizophrenia: Secondary | ICD-10-CM | POA: Diagnosis present

## 2015-04-14 DIAGNOSIS — R451 Restlessness and agitation: Secondary | ICD-10-CM | POA: Insufficient documentation

## 2015-04-14 DIAGNOSIS — F1420 Cocaine dependence, uncomplicated: Secondary | ICD-10-CM | POA: Diagnosis not present

## 2015-04-14 DIAGNOSIS — R45851 Suicidal ideations: Secondary | ICD-10-CM

## 2015-04-14 DIAGNOSIS — F17210 Nicotine dependence, cigarettes, uncomplicated: Secondary | ICD-10-CM | POA: Insufficient documentation

## 2015-04-14 LAB — BASIC METABOLIC PANEL
Anion gap: 5 (ref 5–15)
BUN: 18 mg/dL (ref 6–20)
CHLORIDE: 108 mmol/L (ref 101–111)
CO2: 29 mmol/L (ref 22–32)
CREATININE: 0.93 mg/dL (ref 0.61–1.24)
Calcium: 9.3 mg/dL (ref 8.9–10.3)
GFR calc Af Amer: >60 mL/min (ref >60)
GFR calc non Af Amer: >60 mL/min (ref >60)
Glucose, Bld: 200 mg/dL — ABNORMAL HIGH (ref 65–99)
POTASSIUM: 3.6 mmol/L (ref 3.5–5.1)
SODIUM: 142 mmol/L (ref 135–145)

## 2015-04-14 LAB — CBC WITH DIFFERENTIAL/PLATELET
Basophils Absolute: 0 10*3/uL (ref 0.0–0.1)
Basophils Relative: 1 %
Eosinophils Absolute: 0.1 10*3/uL (ref 0.0–0.7)
Eosinophils Relative: 1 %
HEMATOCRIT: 36.6 % — AB (ref 39.0–52.0)
HEMOGLOBIN: 11.6 g/dL — AB (ref 13.0–17.0)
LYMPHS ABS: 1.4 10*3/uL (ref 0.7–4.0)
LYMPHS PCT: 22 %
MCH: 29.4 pg (ref 26.0–34.0)
MCHC: 31.7 g/dL (ref 30.0–36.0)
MCV: 92.9 fL (ref 78.0–100.0)
MONOS PCT: 9 %
Monocytes Absolute: 0.6 10*3/uL (ref 0.1–1.0)
NEUTROS ABS: 4.4 10*3/uL (ref 1.7–7.7)
NEUTROS PCT: 67 %
Platelets: 187 10*3/uL (ref 150–400)
RBC: 3.94 MIL/uL — AB (ref 4.22–5.81)
RDW: 14.3 % (ref 11.5–15.5)
WBC: 6.6 10*3/uL (ref 4.0–10.5)

## 2015-04-14 LAB — RAPID URINE DRUG SCREEN, HOSP PERFORMED
Amphetamines: NOT DETECTED
Barbiturates: NOT DETECTED
Benzodiazepines: NOT DETECTED
COCAINE: POSITIVE — AB
OPIATES: NOT DETECTED
TETRAHYDROCANNABINOL: NOT DETECTED

## 2015-04-14 LAB — ETHANOL: Alcohol, Ethyl (B): <5 mg/dL (ref <5)

## 2015-04-14 LAB — CBG MONITORING, ED: GLUCOSE-CAPILLARY: 148 mg/dL — AB (ref 65–99)

## 2015-04-14 MED ORDER — METFORMIN HCL 500 MG PO TABS
500.0000 mg | ORAL_TABLET | Freq: Two times a day (BID) | ORAL | Status: DC
  Administered 2015-04-14 – 2015-04-15 (×2): 500 mg via ORAL
  Filled 2015-04-14 (×4): qty 1

## 2015-04-14 MED ORDER — ZIPRASIDONE MESYLATE 20 MG IM SOLR
20.0000 mg | INTRAMUSCULAR | Status: AC | PRN
  Administered 2015-04-14: 20 mg via INTRAMUSCULAR
  Filled 2015-04-14: qty 20

## 2015-04-14 MED ORDER — ARIPIPRAZOLE 5 MG PO TABS
5.0000 mg | ORAL_TABLET | Freq: Two times a day (BID) | ORAL | Status: DC
  Administered 2015-04-14 – 2015-04-15 (×2): 5 mg via ORAL
  Filled 2015-04-14 (×4): qty 1

## 2015-04-14 MED ORDER — RISPERIDONE 2 MG PO TBDP
2.0000 mg | ORAL_TABLET | Freq: Three times a day (TID) | ORAL | Status: DC | PRN
  Filled 2015-04-14: qty 1

## 2015-04-14 MED ORDER — LORAZEPAM 1 MG PO TABS: 1.0000 mg | ORAL_TABLET | ORAL | Status: DC | PRN

## 2015-04-14 MED ORDER — ACETAMINOPHEN 325 MG PO TABS
650.0000 mg | ORAL_TABLET | Freq: Four times a day (QID) | ORAL | Status: DC | PRN
  Administered 2015-04-14 – 2015-04-15 (×2): 650 mg via ORAL
  Filled 2015-04-14 (×2): qty 2

## 2015-04-14 MED ORDER — METFORMIN HCL 500 MG PO TABS
1000.0000 mg | ORAL_TABLET | Freq: Two times a day (BID) | ORAL | Status: DC
  Filled 2015-04-14 (×2): qty 2

## 2015-04-14 MED ORDER — AMANTADINE HCL 100 MG PO CAPS
100.0000 mg | ORAL_CAPSULE | Freq: Two times a day (BID) | ORAL | Status: DC
  Administered 2015-04-14 – 2015-04-15 (×3): 100 mg via ORAL
  Filled 2015-04-14 (×5): qty 1

## 2015-04-14 MED ORDER — LORAZEPAM 1 MG PO TABS
1.0000 mg | ORAL_TABLET | Freq: Once | ORAL | Status: AC
  Administered 2015-04-14: 1 mg via ORAL
  Filled 2015-04-14: qty 1

## 2015-04-14 MED ORDER — ACETAMINOPHEN 325 MG PO TABS
650.0000 mg | ORAL_TABLET | Freq: Once | ORAL | Status: AC
  Administered 2015-04-14: 650 mg via ORAL
  Filled 2015-04-14: qty 2

## 2015-04-14 MED ORDER — HYDROXYZINE HCL 25 MG PO TABS
25.0000 mg | ORAL_TABLET | Freq: Two times a day (BID) | ORAL | Status: DC
  Administered 2015-04-14 – 2015-04-15 (×2): 25 mg via ORAL
  Filled 2015-04-14 (×2): qty 1

## 2015-04-14 NOTE — ED Notes (Signed)
Report received from Edie RN. Pt. Sleeping, respirations regular and unlabored. Will continue to monitor for safety via security cameras and Q 15 minute checks. 

## 2015-04-14 NOTE — ED Notes (Signed)
Pt. Noted sleeping in room. No complaints or concerns voiced. No distress or abnormal behavior noted. Will continue to monitor with security cameras. Q 15 minute rounds continue. 
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
## 2015-04-14 NOTE — ED Notes (Signed)
Pt refused VS, RN notified

## 2015-04-14 NOTE — Progress Notes (Signed)
Pt confirmed with CM he still has not used there previous resources she offered to get a pcp but unable to tell CM why Pt states "okay, Okay I will do it"    CM discussed and provided written information for pcps, discussed the importance of pcp vs EDP services for f/u care, www.needymeds.org, www.goodrx.com, discounted pharmacies and other [place] resources such as [facility] , [facility], affordable care act, financial assistance, uninsured dental services, [place] med assist, DSS and  health department  Reviewed resources like [facility], family medicine at [address], [facility], [facility], local urgent care centers, [facility], [facility], general medical clinics, [facility] services of the [place], [facility] urgent care plus others, medication resources, [facility] out patient pharmacies and housing

## 2015-04-14 NOTE — ED Provider Notes (Addendum)
2CSN: [id number]   Arrival date & time 04/14/15 0146  History    Chief Complaint  Patient presents with  . Suicidal  . Facial Pain    HPI HPI Comments: This is a 54 year old male well known to the emergency department with a history of paranoid schizophrenia who [facility].  Tonight he presents with extreme agitation fear and expresses suicidality suicidality- stating he will cut himself.  He is requesting Thorazine, states he needs his psychiatric medicine.  He is wearing blue scrubs that were provided this morning that are quite dirty.  Otherwise, he appears well kept  The history is provided by the patient. No language interpreter was used.    Past Medical History  Diagnosis Date  . Diabetes mellitus without complication (HCC)   . Hypertension   . Schizophrenia (HCC)   . CHF (congestive heart failure) (HCC)   . Neuropathy (HCC)   . Polysubstance abuse   . Cocaine abuse   . Homelessness   . Hepatitis C   . Chronic foot pain   . Homelessness     History reviewed. No pertinent past surgical history.  Family History  Problem Relation Age of Onset  . Hypertension Other   . Diabetes Other     Social History  Substance Use Topics  . Smoking status: Current Every Day Smoker -- 1.00 packs/day for 20 years    Types: Cigarettes  . Smokeless tobacco: Current User  . Alcohol Use: Yes     Comment: Daily Drinker      Review of Systems  Constitutional: Negative for fever and diaphoresis.  HENT: Positive for sore throat. Negative for trouble swallowing.   Respiratory: Negative for shortness of breath.   Cardiovascular: Negative for chest pain.  Skin: Negative for rash and wound.  Psychiatric/Behavioral: Positive for hallucinations and agitation. The patient is nervous/anxious.   All other systems reviewed and are negative.  Home Medications   Prior to Admission medications   Medication Sig Start Date End Date Taking?  Authorizing Provider  amantadine (SYMMETREL) 100 MG capsule Take 1 capsule (100 mg total) by mouth 2 (two) times daily. Patient not taking: Reported on 05/09/2015 04/15/15   Earney NavyJosephine C Onuoha, NP  ARIPiprazole (ABILIFY) 5 MG tablet Take 1 tablet (5 mg total) by mouth 2 (two) times daily after a meal. Patient not taking: Reported on 05/09/2015 04/15/15   Earney NavyJosephine C Onuoha, NP    Allergies  Haldol  Triage Vitals: BP 171/100 mmHg  Pulse 88  Temp(Src) 97.5 F (36.4 C) (Oral)  Resp 16  SpO2 100%  Physical Exam  Constitutional: He appears well-developed and well-nourished.  HENT:  Head: Normocephalic.  Eyes: Pupils are equal, round, and reactive to light.  Neck: Normal range of motion.  Cardiovascular: Normal rate and regular rhythm.   Pulmonary/Chest: Effort normal and breath sounds normal.  Abdominal: Soft.  Musculoskeletal: He exhibits no edema or tenderness.  Neurological: He is alert.  Skin: Skin is warm. No rash noted.  Psychiatric: His mood appears anxious. His speech is rapid and/or pressured and tangential. He is agitated. Thought content is paranoid. Cognition and memory are impaired. He expresses impulsivity and inappropriate judgment. He expresses suicidal ideation. He expresses no suicidal plans.  Nursing note and vitals reviewed.   ED Course  Procedures   DIAGNOSTIC STUDIES: Oxygen Saturation is 100% on RA, normal by my interpretation.    COORDINATION OF CARE:   Labs Reviewed  URINE RAPID DRUG SCREEN,  HOSP PERFORMED - Abnormal; Notable for the following:    Cocaine POSITIVE (*)    All other components within normal limits  CBC WITH DIFFERENTIAL/PLATELET - Abnormal; Notable for the following:    RBC 3.94 (*)    Hemoglobin 11.6 (*)    HCT 36.6 (*)    All other components within normal limits  BASIC METABOLIC PANEL - Abnormal; Notable for the following:    Glucose, Bld 200 (*)    All other components within normal limits  CBG MONITORING, ED - Abnormal;  Notable for the following:    Glucose-Capillary 148 (*)    All other components within normal limits  CBG MONITORING, ED - Abnormal; Notable for the following:    Glucose-Capillary 157 (*)    All other components within normal limits  ETHANOL  HEMOGLOBIN A1C    Imaging Review No results found.   MDM   Final diagnoses:  Schizophrenia, paranoid type (HCC)        Earley Favor, NP 04/15/15 0113  Loren Racer, MD 04/16/15 0550  Earley Favor, NP 05/20/15 2010  Loren Racer, MD 05/23/15 0354  Earley Favor, NP 06/01/15 1610  Loren Racer, MD 06/06/15 [id number]

## 2015-04-14 NOTE — Consult Note (Signed)
[place] Psychiatry Consult   Reason for Consult:  Psychiatric Evaluation Referring Physician:  EDP Patient Identification: [name] MRN:  [id number] Principal Diagnosis: Substance induced mood disorder [facility]) Diagnosis:   Patient Active Problem List   Diagnosis Date Noted  . Substance induced mood disorder ([place]) [F19.94] 03/13/2015    Priority: High  . Acute kidney failure ([place]) [N17.9] 01/26/2015  . Schizophrenia, paranoid type ([place]) [F20.0] 01/17/2015  . Suicidal ideation [R45.851]   . Drug hallucinosis ([place]) [F19.951] 10/08/2014  . Chronic paranoid schizophrenia ([place]) [F20.0] 09/07/2014  . Substance or medication-induced bipolar and related disorder with onset during intoxication ([place]) [F19.94] 08/10/2014  . Acute CHF (congestive heart failure) ([place]) [I50.9] 07/29/2014  . Cocaine use disorder, severe, dependence ([place]) [F14.20]   . Essential hypertension, benign [I10] 03/28/2013  . Diabetes mellitus ([place]) [E11.9] 03/15/2013    Total Time spent with patient: 30 minutes  Subjective:   [name] is a 54 y.o. male patient who presented voluntarily to [facility] ED for evaluation suicidal ideation.  HPI:  [name] is is an [place] American male patient who is well known to our psychiatric service. He states having suicidal ideation for the past 3 weeks with a plan to stab or shoot himself. He reports being non-compliant with medications. He has not had his medications in the past 6 months and has been self-medicating with crack, marijuana and alcohol.    Today, he has passive suicidal ideation. He denies auditory or visual hallucinations. He does express he needs help with rehab for his drug use.   Past Psychiatric History: Paranoid schizophrenia, cocaine use disorder, previous suicide attempts  Risk to Self: Suicidal Ideation: Yes-Currently Present Suicidal Intent: Yes-Currently Present Is patient at risk for suicide?: Yes Suicidal Plan?:  Yes-Currently Present Specify Current Suicidal Plan: Stab or shoot self  Access to Means: Yes Specify Access to Suicidal Means: Gun, Knives, Sharps  What has been your use of drugs/alcohol within the last 12 months?: Abusing: alcohol, crack, thc  How many times?:  (Multiple ) Other Self Harm Risks: None  Triggers for Past Attempts: None known Intentional Self Injurious Behavior: None Comment - Self Injurious Behavior: None  Risk to Others: Homicidal Ideation: Yes-Currently Present Thoughts of Harm to Others: Yes-Currently Present Comment - Thoughts of Harm to Others: HI thoughts towards street  Current Homicidal Intent: No-Not Currently/Within Last 6 Months Current Homicidal Plan: No-Not Currently/Within Last 6 Months Describe Current Homicidal Plan: No specific plan  Access to Homicidal Means: No Describe Access to Homicidal Means: None  Identified Victim: None  History of harm to others?: No Assessment of Violence: None Noted Violent Behavior Description: None  Does patient have access to weapons?: No Criminal Charges Pending?: Yes Describe Pending Criminal Charges: Cocaine possession; B&E of a motor vehicle  Does patient have a court date: Yes Court Date:  (Nov 2016) Prior Inpatient Therapy: Prior Inpatient Therapy: Yes Prior Therapy Dates: 2001,2005,2009,2010,2014,2015,2016 Prior Therapy Facilty/Provider(s): [id number], [facility] [place] Reason for Treatment: schizophrenia, SI/HI Prior Outpatient Therapy: Prior Outpatient Therapy: Yes Prior Therapy Dates: Current  Prior Therapy Facilty/Provider(s): [name]  Reason for Treatment: Med Mgt  Does patient have an ACCT team?: No Does patient have Intensive In-House Services?  : No Does patient have [name] services? : No Does patient have P4CC services?: No  Past Medical History:  Past Medical History  Diagnosis Date  . Diabetes mellitus without complication ([place])   . Hypertension   . Schizophrenia ([place])   . CHF  (congestive heart  failure) ([place])   . Neuropathy ([place])   . Polysubstance abuse   . Cocaine abuse   . Homelessness   . Hepatitis C   . Chronic foot pain   . Homelessness    History reviewed. No pertinent past surgical history. Family History:  Family History  Problem Relation Age of Onset  . Hypertension Other   . Diabetes Other     Social History:  History  Alcohol Use  . Yes    Comment: Daily Drinker      History  Drug Use  . 7.00 per week  . Special: "Crack" cocaine, Cocaine, Marijuana    Comment: Cocaine tonight, Marijuana "a long time"    Social History   Social History  . Marital Status: Divorced    Spouse Name: N/A  . Number of Children: N/A  . Years of Education: N/A   Social History Main Topics  . Smoking status: Current Every Day Smoker -- 1.00 packs/day for 20 years    Types: Cigarettes  . Smokeless tobacco: Current User  . Alcohol Use: Yes     Comment: Daily Drinker   . Drug Use: 7.00 per week    Special: "Crack" cocaine, Cocaine, Marijuana     Comment: Cocaine tonight, Marijuana "a long time"  . Sexual Activity: No   Other Topics Concern  . None   Social History Narrative   ** Merged History Encounter **       Additional Social History:    Pain Medications: See MAR  Prescriptions: See MAR  Over the Counter: See MAR  History of alcohol / drug use?: Yes Longest period of sobriety (when/how long): Only during inpt admissions  Negative Consequences of Use: Work / Youth worker, Charity fundraiser relationships, Scientist, research (physical sciences), Museum/gallery curator Withdrawal Symptoms: Other (Comment) (No w/d sxs ) Name of Substance 1: Crack  1 - Age of First Use: 30's  1 - Amount (size/oz): 1 Gram  1 - Frequency: Daily  1 - Duration: On-going  1 - Last Use / Amount: 04/13/15 Name of Substance 2: THC  2 - Age of First Use: Teens  2 - Amount (size/oz): 1 Joint  2 - Frequency: Daily  2 - Duration: On-going  2 - Last Use / Amount: 04/13/15 Name of Substance 3: Alcohol  3 - Age of First Use:  Teens  3 - Amount (size/oz): 1 Glass of Vodka  3 - Frequency: Daily  3 - Duration: On-going  3 - Last Use / Amount: 04/13/15  Allergies:   Allergies  Allergen Reactions  . Haldol [Haloperidol] Other (See Comments)    Muscle spasms, loss of voluntary movement. However, pt has taken Thorazine on multiple occasions with no adverse effects.     Labs:  Results for orders placed or performed during the hospital encounter of 04/14/15 (from the past 48 hour(s))  Ethanol     Status: None   Collection Time: 04/14/15  2:47 AM  Result Value Ref Range   Alcohol, Ethyl (B) <5 <5 mg/dL    Comment:        LOWEST DETECTABLE LIMIT FOR SERUM ALCOHOL IS 5 mg/dL FOR MEDICAL PURPOSES ONLY   CBC with Differential     Status: Abnormal   Collection Time: 04/14/15  2:47 AM  Result Value Ref Range   WBC 6.6 4.0 - 10.5 K/uL   RBC 3.94 (L) 4.22 - 5.81 MIL/uL   Hemoglobin 11.6 (L) 13.0 - 17.0 g/dL   HCT 36.6 (L) 39.0 - 52.0 %   MCV 92.9  78.0 - 100.0 fL   MCH 29.4 26.0 - 34.0 pg   MCHC 31.7 30.0 - 36.0 g/dL   RDW 14.3 11.5 - 15.5 %   Platelets 187 150 - 400 K/uL   Neutrophils Relative % 67 %   Neutro Abs 4.4 1.7 - 7.7 K/uL   Lymphocytes Relative 22 %   Lymphs Abs 1.4 0.7 - 4.0 K/uL   Monocytes Relative 9 %   Monocytes Absolute 0.6 0.1 - 1.0 K/uL   Eosinophils Relative 1 %   Eosinophils Absolute 0.1 0.0 - 0.7 K/uL   Basophils Relative 1 %   Basophils Absolute 0.0 0.0 - 0.1 K/uL  Basic metabolic panel     Status: Abnormal   Collection Time: 04/14/15  2:47 AM  Result Value Ref Range   Sodium 142 135 - 145 mmol/L   Potassium 3.6 3.5 - 5.1 mmol/L   Chloride 108 101 - 111 mmol/L   CO2 29 22 - 32 mmol/L   Glucose, Bld 200 (H) 65 - 99 mg/dL   BUN 18 6 - 20 mg/dL   Creatinine, Ser 0.93 0.61 - 1.24 mg/dL   Calcium 9.3 8.9 - 10.3 mg/dL   GFR calc non Af Amer >60 >60 mL/min   GFR calc Af Amer >60 >60 mL/min    Comment: (NOTE) The eGFR has been calculated using the CKD EPI equation. This calculation  has not been validated in all clinical situations. eGFR's persistently <60 mL/min signify possible Chronic Kidney Disease.    Anion gap 5 5 - 15  Urine rapid drug screen (hosp performed)     Status: Abnormal   Collection Time: 04/14/15  3:08 AM  Result Value Ref Range   Opiates NONE DETECTED NONE DETECTED   Cocaine POSITIVE (A) NONE DETECTED   Benzodiazepines NONE DETECTED NONE DETECTED   Amphetamines NONE DETECTED NONE DETECTED   Tetrahydrocannabinol NONE DETECTED NONE DETECTED   Barbiturates NONE DETECTED NONE DETECTED    Comment:        DRUG SCREEN FOR MEDICAL PURPOSES ONLY.  IF CONFIRMATION IS NEEDED FOR ANY PURPOSE, NOTIFY LAB WITHIN 5 DAYS.        LOWEST DETECTABLE LIMITS FOR URINE DRUG SCREEN Drug Class       Cutoff (ng/mL) Amphetamine      1000 Barbiturate      200 Benzodiazepine   532 Tricyclics       992 Opiates          300 Cocaine          300 THC              50     Current Facility-Administered Medications  Medication Dose Route Frequency Provider Last Rate Last Dose  . amantadine (SYMMETREL) capsule 100 mg  100 mg Oral BID Ruie Sendejo      . ARIPiprazole (ABILIFY) tablet 5 mg  5 mg Oral BID PC Pola Furno      . hydrOXYzine (ATARAX/VISTARIL) tablet 25 mg  25 mg Oral BID PC Janace Decker      . metFORMIN (GLUCOPHAGE) tablet 500 mg  500 mg Oral BID WC Lurena Nida, NP       No current outpatient prescriptions on file.    Musculoskeletal: Strength & Muscle Tone: within normal limits Gait & Station: unsteady Patient leans: N/A  Psychiatric Specialty Exam: Review of Systems  Constitutional: Negative.   HENT: Negative.   Eyes: Negative.   Respiratory: Negative.   Cardiovascular: Negative.   Gastrointestinal: Negative.  Genitourinary: Negative.   Musculoskeletal: Negative.   Skin: Negative.   Neurological: Negative.   Endo/Heme/Allergies: Negative.   Psychiatric/Behavioral: Positive for suicidal ideas and substance abuse.    Blood  pressure 171/100, pulse 88, temperature 97.5 F (36.4 C), temperature source Oral, resp. rate 16, SpO2 100 %.There is no weight on file to calculate BMI.  General Appearance: Disheveled  Eye Sport and exercise psychologist::  Body Odor  Speech:  Garbled and Slow  Volume:  Decreased  Mood:  Anxious and Irritable  Affect:  Flat  Thought Process:  Disorganized  Orientation:  Full (Time, Place, and Person)  Thought Content:  Rumination  Suicidal Thoughts:  Yes.  without intent/plan  Homicidal Thoughts:  No  Memory:  Immediate;   Fair Recent;   Poor Remote;   Poor  Judgement:  Poor  Insight:  Shallow  Psychomotor Activity:  Normal  Concentration:  Poor  Recall:  [place] of Knowledge:Fair  Language: Fair  Akathisia:  No  Handed:  Right  AIMS (if indicated):     Assets:  Desire for Improvement Physical Health Resilience  ADL's:  Intact  Cognition: WNL  Sleep:      Treatment Plan Summary: Daily contact with patient to assess and evaluate symptoms and progress in treatment and Medication management  Disposition:  Remain in [facility] overnight with re-evaluation by psychiatry tomorrow.   Serena Colonel, FNP-BC [place] 04/14/2015 1:04 PM Patient seen face-to-face for psychiatric evaluation, chart reviewed and case discussed with the physician extender and developed treatment plan. Reviewed the information documented and agree with the treatment plan. Corena Pilgrim, MD

## 2015-04-14 NOTE — ED Notes (Signed)
Pt has been loud and demanding.  He urinated on the floor stating he could not make it to bathroom although he is one room down from bathroom.  He also urinated in an empty coffee cup.  He is alert and oriented.  Complains of hopelessness and foot pain.  He denies HI and AVH.

## 2015-04-14 NOTE — ED Notes (Signed)
Pt not cooperative, refuses lab work with phlebotomist. States he wants nurse to get labwork. Pt able to be redirected to allow phlebotomist to get lab work.

## 2015-04-14 NOTE — ED Notes (Signed)
Pt arrived on unit. Pt kicked in respiratory cabinet in room. Verbalizes increased agitation and anxiety and requests medication. Pt reports SI. Denies HI/AVH. Pt reports facial pain rated a 5 out of 10. Pt states that he is "afraid that someone is going to kill me", and "I'm not the violent type, I'm the afraid type". Pt moved to a different room and given 20 mg Geodon injection. Safety is maintained through q15 minute safety checks and video monitoring.

## 2015-04-14 NOTE — Progress Notes (Signed)
Patient was referred at:  [facility] - per intake, fax referral for review. [facility] - per Julio Almeen, fax referral, beds open. [facility] - per Wandalee FerdinandMary Catherine, accepting referrals. [facility] - [facility] intake, fax referral for the waitlist. [facility] - left voicemail. [facility] - per French Anaracy, 1 male geriatric bed, fax referral for the waitlist. [facility] - per intake, fax referral.  At capacity: [facility] - per Time WarnerSheron. [facility] - per Graciela HusbandsBarbara  Catia Uri Covey, LCSWA Disposition staff 04/14/2015 7:12 PM

## 2015-04-14 NOTE — ED Notes (Signed)
Pt reports he is suicidal and is afraid of his ex-wife [name]. Denies homicidal ideations. When asked about a plan says, "I would just stab myself to death or shoot myself to death." C/o throat pain. No other c/c. Brought in by [facility]. Pt is homeless at this time.

## 2015-04-15 DIAGNOSIS — F1420 Cocaine dependence, uncomplicated: Secondary | ICD-10-CM | POA: Diagnosis not present

## 2015-04-15 LAB — CBG MONITORING, ED: GLUCOSE-CAPILLARY: 157 mg/dL — AB (ref 65–99)

## 2015-04-15 MED ORDER — ARIPIPRAZOLE 5 MG PO TABS: 5.0000 mg | ORAL_TABLET | Freq: Two times a day (BID) | ORAL | Status: DC

## 2015-04-15 MED ORDER — AMANTADINE HCL 100 MG PO CAPS: 100.0000 mg | ORAL_CAPSULE | Freq: Two times a day (BID) | ORAL | Status: DC

## 2015-04-15 NOTE — ED Notes (Signed)
Pt. Noted sleeping in room. No complaints or concerns voiced. No distress or abnormal behavior noted. Will continue to monitor with security cameras. Q 15 minute rounds continue. 

## 2015-04-15 NOTE — Progress Notes (Signed)
11:03am. CSW met with pt to discuss outpatient follow up. Pt states that he is following up a community-based team at [facility]. Pt provided handouts on additional outpatient resources for diabetes management and mental health. CSW to sign off.  [place] Worker [facility] phone: [phone]

## 2015-04-15 NOTE — ED Notes (Signed)
Dr Jama Flavorscobos and Julieanne CottonJosephine NP into see

## 2015-04-15 NOTE — ED Notes (Addendum)
Written dc instructions reviewed w/ pt.  Pt encouraged to take his medications as directed and follow [facility] and his provider as directed.  Pt denied si/hi/avh on dc and was encouraged to seek treatment for any return of si/hi/avh.  Pt verbalized understanding. Bus pass given.  Pt ambulatory w/o difficulty to dc area, belongings returned after leaving the area.  Pt declined to have his BP rechecked and reports that he is suppose to take BP medication but does not. Pt encouraged to take his medications, pt verbalized understanding.

## 2015-04-15 NOTE — ED Notes (Signed)
tts into see 

## 2015-04-15 NOTE — Consult Note (Signed)
  Psychiatric Specialty Exam: Physical Exam  ROS  Blood pressure 157/105, pulse 104, temperature 98.2 F (36.8 C), temperature source Oral, resp. rate 19, SpO2 94 %.There is no weight on file to calculate BMI.  General Appearance: Casual  Eye Contact::  Good  Speech:  Clear and Coherent and Normal Rate  Volume:  Normal  Mood:  Euthymic  Affect:  Congruent  Thought Process:  Coherent, Goal Directed and Intact  Orientation:  Full (Time, Place, and Person)  Thought Content:  WDL  Suicidal Thoughts:  No  Homicidal Thoughts:  No  Memory:  Immediate;   Good Recent;   Good Remote;   Good  Judgement:  Fair  Insight:  Good  Psychomotor Activity:  Normal  Concentration:  Good  Recall:  Good  Fund of Knowledge:  Fair  Language:  Good  Akathisia:  NA  Handed:  Right  AIMS (if indicated):     Assets:  Desire for Improvement  ADL's:  Intact  Cognition:  WNL  Sleep:      Patient was pleasant today and denied feeling suicidal or homicidal   He said yesterday he was going through past thoughts and became emotional.  He also admitted to smoking crack Cocaine and noted that this has become a problem for him.  He is not taking his Abilify as prescribed but could not come up with a reason for not doing so.  He reported that he could not get into his apartment he shares with his cousin because he lost his key so he came to the ER.  He stated that he lied about feeling suicidal just for him to be able to sleep here last night.  He denies SI/HI/AVH.  He is discharged home and will follow up with his ACT team.  Cocaine use disorder, severe, dependence (HCC)   Plan:  Discharge home and [facility]  Josephine Onuoha   PMHNP-BC  Case discussed with me as above , Nehemiah MassedFernando Cobos, MD

## 2015-04-15 NOTE — [facility] Suicide Risk Assessment (Cosign Needed)
Suicide Risk Assessment  Discharge Assessment   [facility]   Demographic Factors:  Male, Low socioeconomic status and Unemployed  Total Time spent with patient: 20 minutes  Musculoskeletal: Strength & Muscle Tone: within normal limits Gait & Station: normal Patient leans: N/A  Psychiatric Specialty Exam:     Blood pressure 157/105, pulse 104, temperature 98.2 F (36.8 C), temperature source Oral, resp. rate 19, SpO2 94 %.There is no weight on file to calculate BMI.  General Appearance: Casual  Eye Contact::  Good  Speech:  Clear and Coherent and Normal Rate  Volume:  Normal  Mood:  Euthymic  Affect:  Congruent  Thought Process:  Coherent, Goal Directed and Intact  Orientation:  Full (Time, Place, and Person)  Thought Content:  WDL  Suicidal Thoughts:  No  Homicidal Thoughts:  No  Memory:  Immediate;   Good Recent;   Good Remote;   Good  Judgement:  Fair  Insight:  Good  Psychomotor Activity:  Normal  Concentration:  Good  Recall:  Good  Fund of Knowledge:  Fair  Language:  Good  Akathisia:  NA  Handed:  Right  AIMS (if indicated):     Assets:  Desire for Improvement  ADL's:  Intact  Cognition:  WNL        Has this patient used any form of tobacco in the last 30 days? (Cigarettes, Smokeless Tobacco, Cigars, and/or Pipes) Yes, A prescription for an FDA-approved tobacco cessation medication was offered at discharge and the patient refused  Mental Status Per Nursing Assessment::   On Admission:     Current Mental Status by Physician: NA  Loss Factors: NA  Historical Factors: NA  Risk Reduction Factors:   Living with another person, especially a relative, Positive social support and Positive coping skills or problem solving skills  Continued Clinical Symptoms:  Alcohol/Substance Abuse/Dependencies Schizophrenia:   Paranoid or undifferentiated type  Cognitive Features That Contribute To Risk:  Polarized thinking    Suicide  Risk:  Minimal: No identifiable suicidal ideation.  Patients presenting with no risk factors but with morbid ruminations; may be classified as minimal risk based on the severity of the depressive symptoms  Principal Problem: Cocaine use disorder, severe, dependence [facility](HCC) Discharge Diagnoses:  Patient Active Problem List   Diagnosis Date Noted  . Substance or medication-induced bipolar and related disorder with onset during intoxication [facility](HCC) [F19.94] 08/10/2014    Priority: High  . Cocaine use disorder, severe, dependence (HCC) [F14.20]     Priority: High  . Substance induced mood disorder (HCC) [F19.94] 03/13/2015  . Acute kidney failure (HCC) [N17.9] 01/26/2015  . Schizophrenia, paranoid type (HCC) [F20.0] 01/17/2015  . Suicidal ideation [R45.851]   . 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 Acute CHF (congestive heart failure) (HCC) [I50.9] 07/29/2014  . Essential hypertension, benign [I10] 03/28/2013  . Diabetes mellitus (HCC) [E11.9] 03/15/2013    Follow-up Information    [facility] Go in 2 days.   Specialty:  Behavioral Health   Why:  for mental health and diabetes follow up. Walk in hours every weekday AM beginning at [facility].   Contact information:   [address] [place] [address] [phone]       [facility]   Specialty:  Professional Counselor   Why:  As needed. Should you want additional or alternative mental health treatment beyond [facility], [facility] is  a low-cost option. Call ahead for appointment.   Contact information:   [facility] [place] [address] [place] [address] [phone]       Plan Of Care/Follow-up recommendations:  Activity:  as tolerated Diet:  regular  Is patient on multiple antipsychotic therapies at discharge:  No   Has Patient had three or more failed trials of antipsychotic  monotherapy by history:  No  Recommended Plan for Multiple Antipsychotic Therapies: NA    Aubrey Voong C    PMHNP-BC 04/15/2015, 11:24 AM

## 2015-04-19 LAB — HEMOGLOBIN A1C

## 2015-04-19 IMAGING — DX DG CHEST 2V
2 series · 2 of 2 positions shown · non-contrast
Comparison: 08/02/2014

CLINICAL DATA: 53-year-old with cough and chills.

EXAM:
CHEST  2 VIEW

[chest pa]
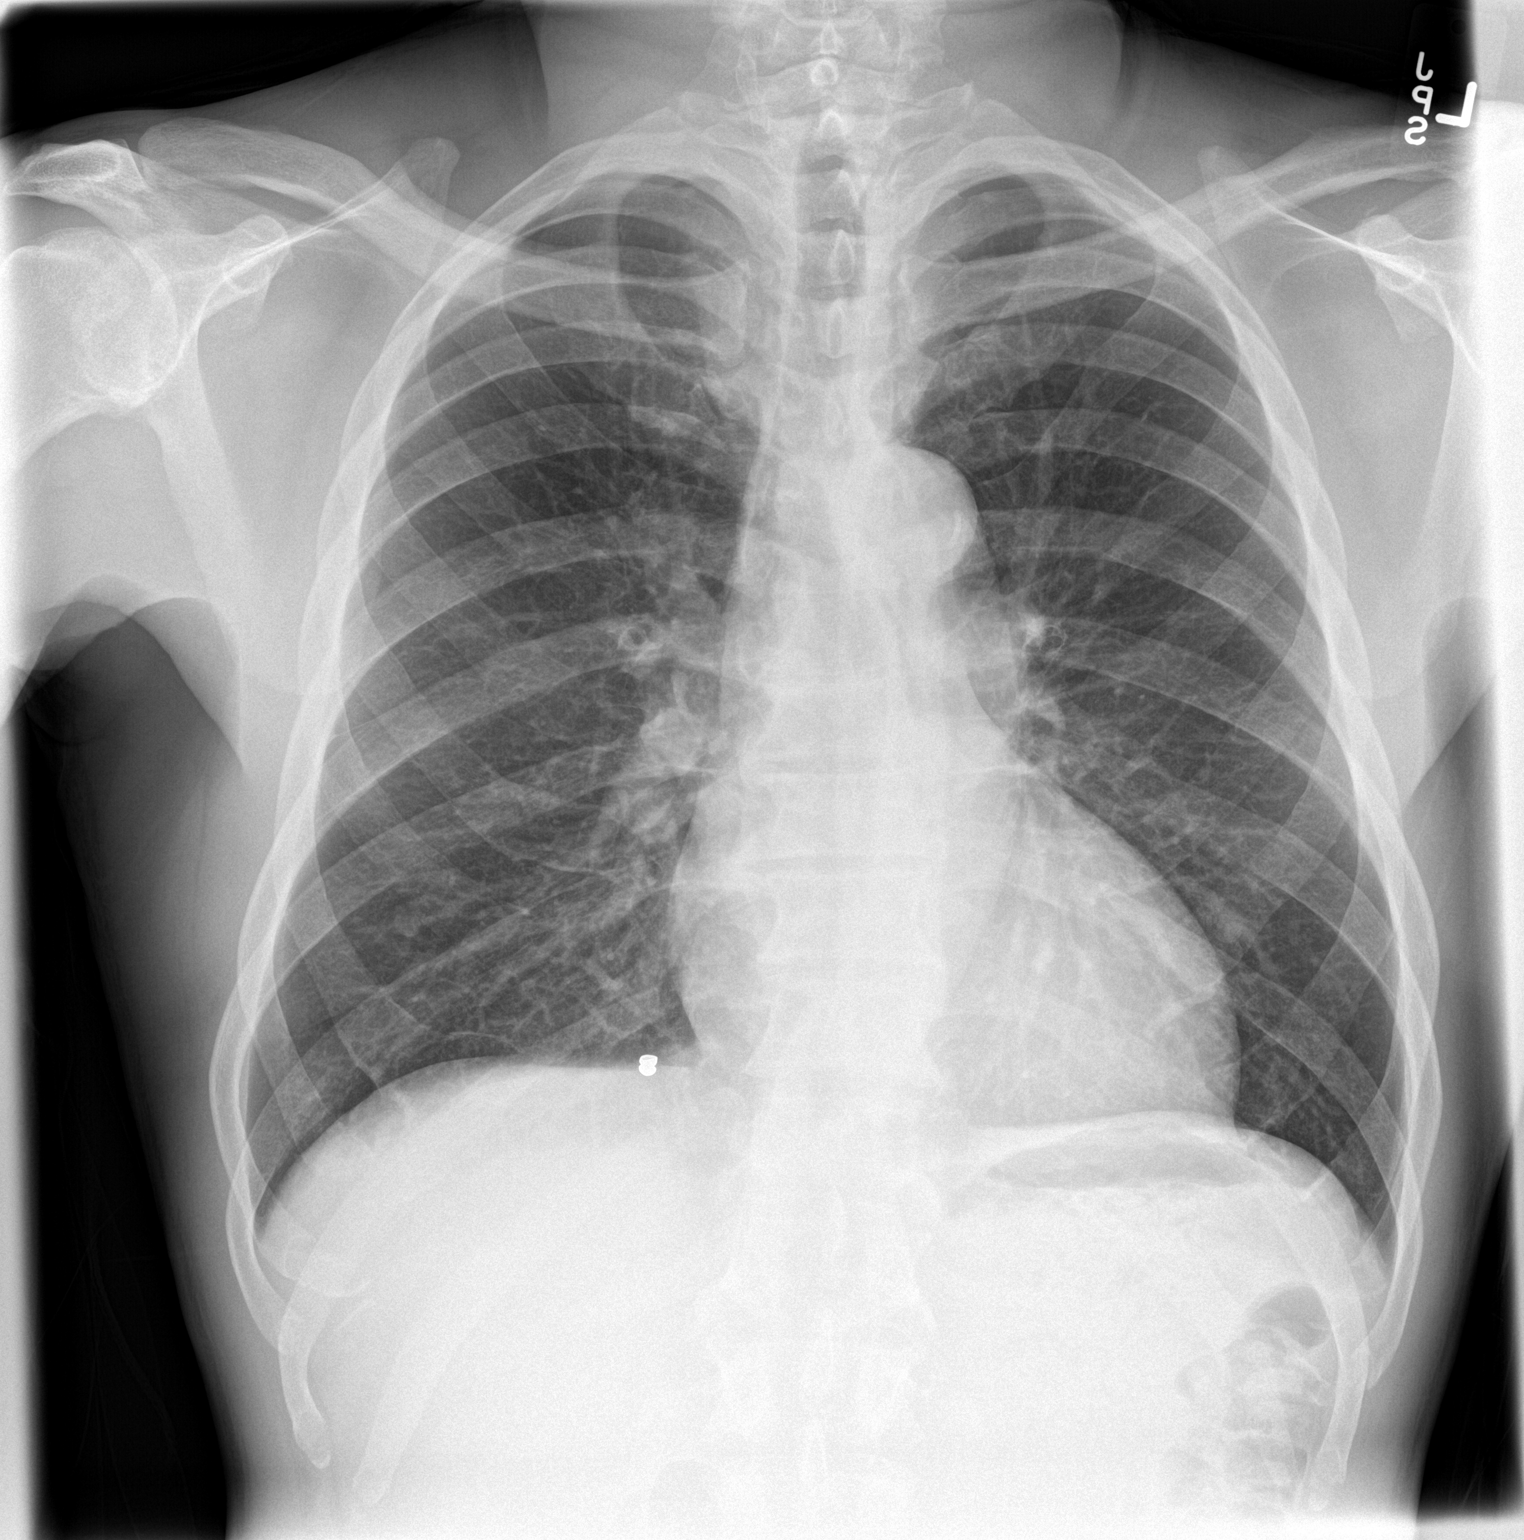

[chest lat]
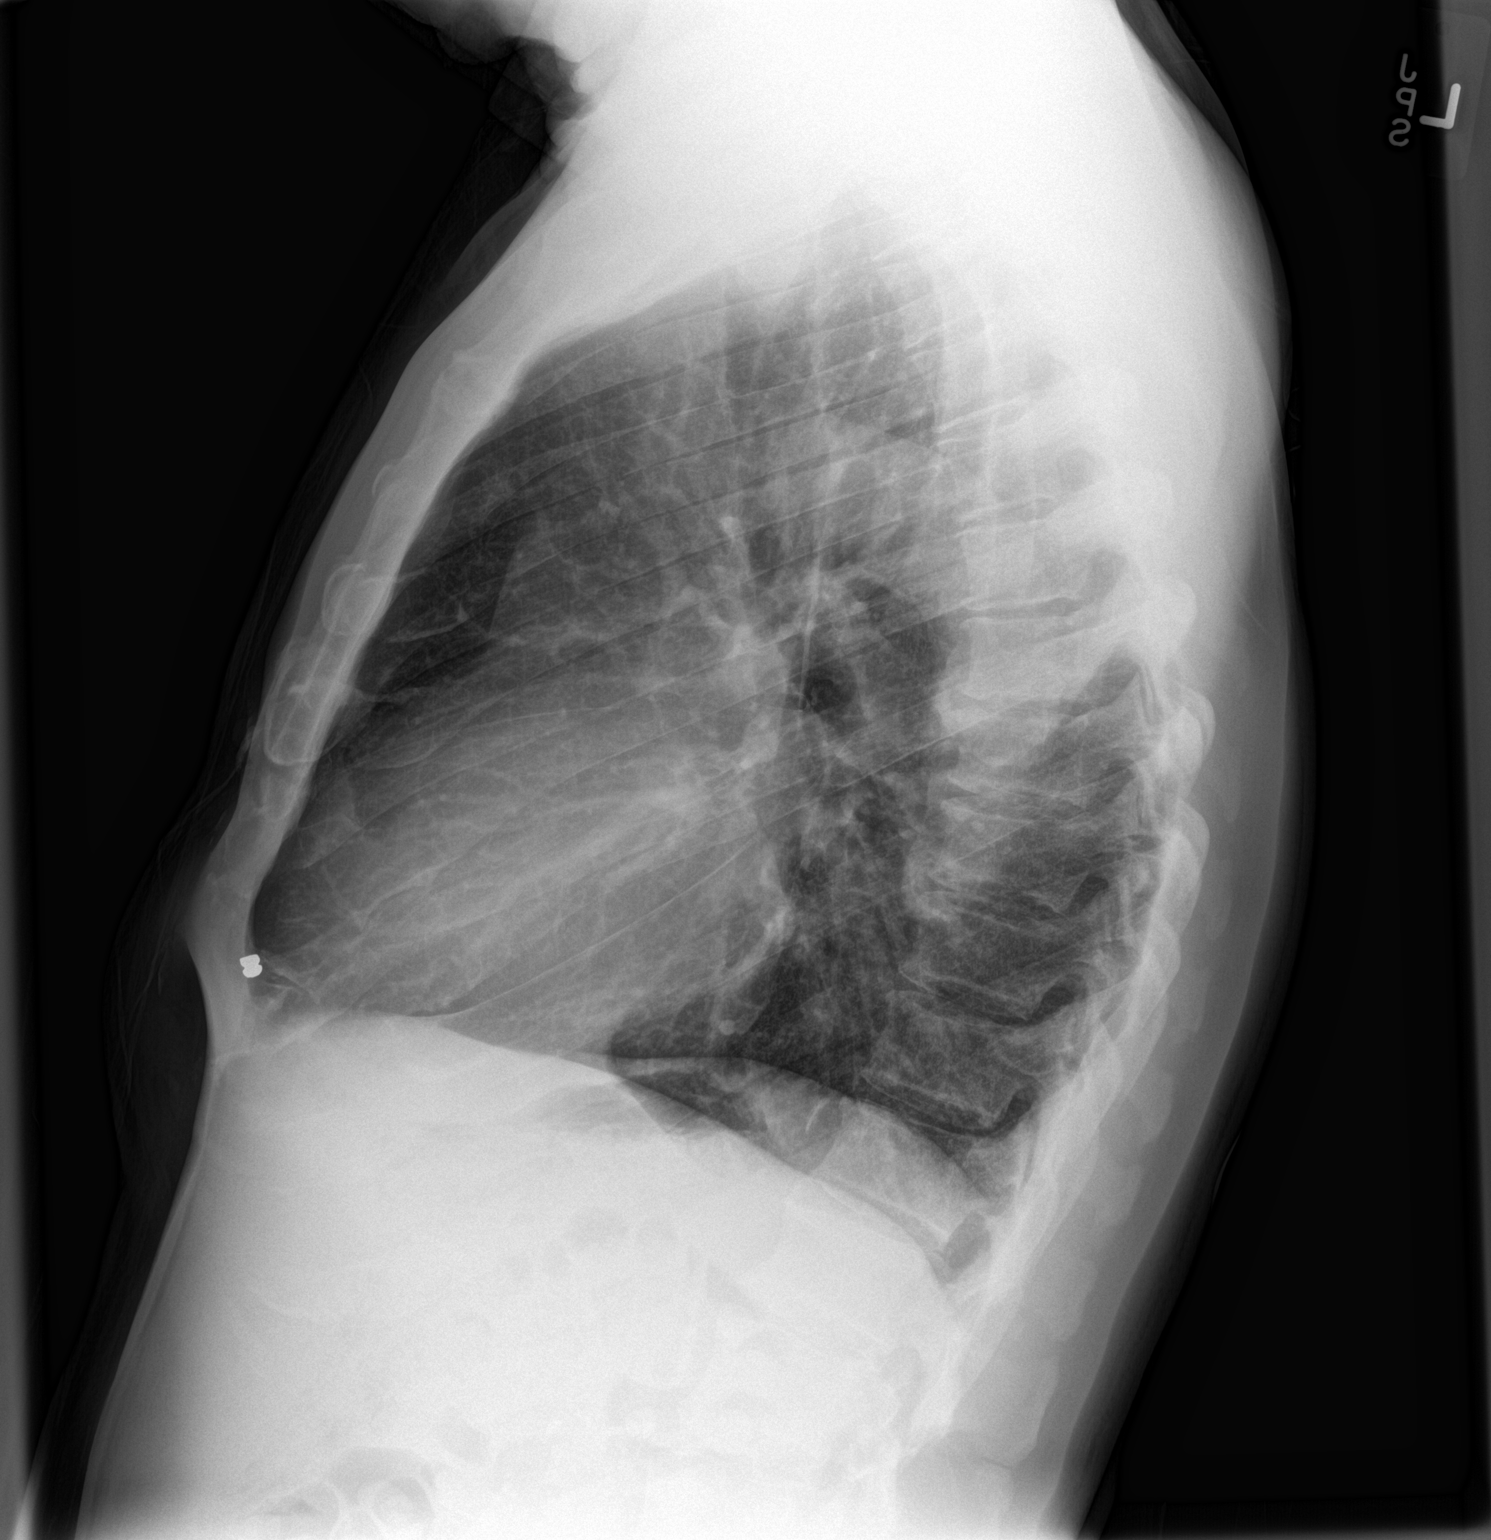

[2 of 2 positions shown; findings below may reference images not displayed]

FINDINGS: Again noted is a small metallic density along the anterior right
lower chest which is unchanged. Both lungs are clear. Heart and
mediastinum are within normal limits. The trachea is midline. No
acute bone abnormality. Small nodular densities on both sides of the
lower chest likely represent nipple shadows.
IMPRESSION: No active cardiopulmonary disease.

Probable bilateral nipple shadows as described.

## 2015-05-09 ENCOUNTER — Emergency Department (HOSPITAL_COMMUNITY)
Admission: EM | Admit: 2015-05-09 | Discharge: 2015-05-09 | Disposition: A | Discharge status: Home / Self Care | Payer: Medicare Other | Attending: Emergency Medicine | Admitting: Emergency Medicine

## 2015-05-09 ENCOUNTER — Encounter (HOSPITAL_COMMUNITY): Payer: Self-pay | Admitting: Emergency Medicine

## 2015-05-09 DIAGNOSIS — Z8619 Personal history of other infectious and parasitic diseases: Secondary | ICD-10-CM | POA: Insufficient documentation

## 2015-05-09 DIAGNOSIS — I10 Essential (primary) hypertension: Secondary | ICD-10-CM | POA: Insufficient documentation

## 2015-05-09 DIAGNOSIS — E119 Type 2 diabetes mellitus without complications: Secondary | ICD-10-CM | POA: Insufficient documentation

## 2015-05-09 DIAGNOSIS — J029 Acute pharyngitis, unspecified: Secondary | ICD-10-CM | POA: Insufficient documentation

## 2015-05-09 DIAGNOSIS — G8929 Other chronic pain: Secondary | ICD-10-CM | POA: Insufficient documentation

## 2015-05-09 DIAGNOSIS — F209 Schizophrenia, unspecified: Secondary | ICD-10-CM | POA: Insufficient documentation

## 2015-05-09 DIAGNOSIS — Z590 Homelessness: Secondary | ICD-10-CM | POA: Insufficient documentation

## 2015-05-09 DIAGNOSIS — F32A Depression, unspecified: Secondary | ICD-10-CM

## 2015-05-09 DIAGNOSIS — I509 Heart failure, unspecified: Secondary | ICD-10-CM | POA: Insufficient documentation

## 2015-05-09 DIAGNOSIS — Z720 Tobacco use: Secondary | ICD-10-CM | POA: Insufficient documentation

## 2015-05-09 DIAGNOSIS — F329 Major depressive disorder, single episode, unspecified: Secondary | ICD-10-CM | POA: Insufficient documentation

## 2015-05-09 LAB — CBC
HEMATOCRIT: 35 % — AB (ref 39.0–52.0)
HEMOGLOBIN: 11.2 g/dL — AB (ref 13.0–17.0)
MCH: 29.1 pg (ref 26.0–34.0)
MCHC: 32 g/dL (ref 30.0–36.0)
MCV: 90.9 fL (ref 78.0–100.0)
Platelets: 157 10*3/uL (ref 150–400)
RBC: 3.85 MIL/uL — AB (ref 4.22–5.81)
RDW: 13.1 % (ref 11.5–15.5)
WBC: 6.2 10*3/uL (ref 4.0–10.5)

## 2015-05-09 LAB — COMPREHENSIVE METABOLIC PANEL
ALBUMIN: 3.9 g/dL (ref 3.5–5.0)
ALT: 27 U/L (ref 17–63)
ANION GAP: 7 (ref 5–15)
AST: 72 U/L — ABNORMAL HIGH (ref 15–41)
Alkaline Phosphatase: 91 U/L (ref 38–126)
BUN: 12 mg/dL (ref 6–20)
CHLORIDE: 107 mmol/L (ref 101–111)
CO2: 27 mmol/L (ref 22–32)
CREATININE: 0.71 mg/dL (ref 0.61–1.24)
Calcium: 9.2 mg/dL (ref 8.9–10.3)
GFR calc non Af Amer: >60 mL/min (ref >60)
GLUCOSE: 140 mg/dL — AB (ref 65–99)
Potassium: 3.3 mmol/L — ABNORMAL LOW (ref 3.5–5.1)
SODIUM: 141 mmol/L (ref 135–145)
Total Bilirubin: 0.5 mg/dL (ref 0.3–1.2)
Total Protein: 6.9 g/dL (ref 6.5–8.1)

## 2015-05-09 LAB — ACETAMINOPHEN LEVEL

## 2015-05-09 LAB — SALICYLATE LEVEL

## 2015-05-09 LAB — ETHANOL: Alcohol, Ethyl (B): <5 mg/dL (ref <5)

## 2015-05-09 MED ORDER — ARIPIPRAZOLE 5 MG PO TABS
5.0000 mg | ORAL_TABLET | Freq: Two times a day (BID) | ORAL | Status: DC
  Filled 2015-05-09 (×3): qty 1

## 2015-05-09 MED ORDER — ONDANSETRON HCL 4 MG PO TABS: 4.0000 mg | ORAL_TABLET | Freq: Three times a day (TID) | ORAL | Status: DC | PRN

## 2015-05-09 MED ORDER — LORAZEPAM 1 MG PO TABS: 0.0000 mg | ORAL_TABLET | Freq: Two times a day (BID) | ORAL | Status: DC

## 2015-05-09 MED ORDER — NICOTINE 21 MG/24HR TD PT24: 21.0000 mg | MEDICATED_PATCH | Freq: Every day | TRANSDERMAL | Status: DC

## 2015-05-09 MED ORDER — AMANTADINE HCL 100 MG PO CAPS
100.0000 mg | ORAL_CAPSULE | Freq: Two times a day (BID) | ORAL | Status: DC
  Filled 2015-05-09 (×2): qty 1

## 2015-05-09 MED ORDER — ACETAMINOPHEN 325 MG PO TABS
650.0000 mg | ORAL_TABLET | ORAL | Status: DC | PRN
  Administered 2015-05-09: 650 mg via ORAL
  Filled 2015-05-09: qty 2

## 2015-05-09 MED ORDER — LORAZEPAM 1 MG PO TABS: 0.0000 mg | ORAL_TABLET | Freq: Four times a day (QID) | ORAL | Status: DC

## 2015-05-09 MED ORDER — MENTHOL 3 MG MT LOZG
1.0000 | LOZENGE | OROMUCOSAL | Status: DC | PRN
  Filled 2015-05-09: qty 9

## 2015-05-09 NOTE — ED Notes (Signed)
Patient requesting to leave. Patient was advised he may not leave as he stated he was suicidal. Patient states "all you have to do is call someone and they will let me leave". Dr Adela LankFloyd notified of patient request to be discharged.

## 2015-05-09 NOTE — ED Notes (Signed)
Pt has belongings in bag.

## 2015-05-09 NOTE — ED Notes (Signed)
Patient ambulated unassisted to restroom to attempt urine specimen collection.

## 2015-05-09 NOTE — ED Notes (Signed)
Pt receiving telepsych at this time 

## 2015-05-09 NOTE — ED Notes (Signed)
MD at bedside. 

## 2015-05-09 NOTE — ED Provider Notes (Signed)
54 yo M with a cc of SI/HI.  Patient is well-known to the department and has episodes of this in the past. He has drug or alcohol intoxication claim suicidality and wakes up the morning and ascites better. This morning patient woke up told the nurse that he was no longer suicidal or homicidal. On exam patient denies suicidal or homicidal ideation. States he has a little bit of a sore throat. On exam patient with some mild posterior erythema. Requesting a cough drop and discharge. We'll discharge the patient home.  8:13 AM:  I have discussed the diagnosis/risks/treatment options with the patient and believe the pt to be eligible for discharge home to follow-up with PCP. We also discussed returning to the ED immediately if new or worsening sx occur. We discussed the sx which are most concerning (e.g., sudden worsening SI/HI) that necessitate immediate return. Medications administered to the patient during their visit and any new prescriptions provided to the patient are listed below.  Medications given during this visit Medications  acetaminophen (TYLENOL) tablet 650 mg (650 mg Oral Given 05/09/15 0548)  nicotine (NICODERM CQ - dosed in mg/24 hours) patch 21 mg (not administered)  ondansetron (ZOFRAN) tablet 4 mg (not administered)  LORazepam (ATIVAN) tablet 0-4 mg (not administered)    Followed by  LORazepam (ATIVAN) tablet 0-4 mg (not administered)  ARIPiprazole (ABILIFY) tablet 5 mg (not administered)  amantadine (SYMMETREL) capsule 100 mg (100 mg Oral Not Given 05/09/15 0548)  menthol-cetylpyridinium (CEPACOL) lozenge 3 mg (not administered)    New Prescriptions   No medications on file    The patient appears reasonably screen and/or stabilized for discharge and I doubt any other medical condition or other [facility] requiring further screening, evaluation, or treatment in the ED at this time prior to discharge.    Melene Planan Arthurine Oleary, DO 05/09/15 [id number]

## 2015-05-09 NOTE — BH Assessment (Addendum)
Tele Assessment Note   [name] is an 54 y.o.single male who came into the [facility] tonight voluntarily reporting a sore throat, hoarseness and SI. Pt was not able to talk much due to hoarseness of his voice.  Pt sts that he is having SI and has been having SI for 2 days.  Pt has a previous dx of schizophrenia, along with a number of serious medical conditions. Pt denies HI, SHI and AVH. Pt sts that he has no plan for killing himself and does not have a plan to harm anyone else. Pt sts that he has had many attempts to kill himself over the years.  Pt sts that he has no been taking meds for at least 6 months.  Pt sts that he was going to [place] for med mgmt but does not go anymore. Pt's symptoms of depression include sadness, tearfulness, excessive guilt, isolation, lack of motivation for activities, irritability, negative outlook, difficulty thinking/concentrating, feels of helplessness and hopelessness, sleep disturbance and eating disturbance. Pt sts he does have panic attacks about every 2 weeks. Pt denies all other anxiety symptoms. Pt has a hx of polysubstance abuse and sts he still is using crack cocaine, marijuana, alcohol and smoking cigarettes. Pt sts that he has cut down on marijuana and alcohol use although he was not able to state what helped him be successful at cutting down. Pt's BAL tonight was <5 and UDS is incomplete at this time. Pt sts that he is sleeping about 1-2 hours per night and was falling asleep sitting up in the ED chair during the assessment. Pt sts he is not eating regularly and sts he has lost 10 in the last month. Pt sts he has a court date coming up in November, 2016, for pending charges for possession of cocaine and B&E of a motor vehicle.   Pt is currently homeless. Pt sts that he sleeps 1-2 hours per night and does not eat regularly, having lost about 10 lbs in the last month. Pt has a number of advanced health issues including Diabetes, CHF, Neuropathy, Hepititus C  and HBP that are continual stressors. Pt has a Care Plan including a Care Coordinator at [facility] and a cousin who looks in on him several times a week and the plan sts he has a place to stay with her. Per records, pt was last seen in the [facility] on 04/14/15, approximately 3 weeks ago for similar symptoms. Pt sts he has had multiple IP stays over a number of years in various hospitals.   Pt was dressed in scrubs and sitting in an ED reclining chair.  Pt was cooperative at first then dismissive. Pt was able to answer questions with short answers or yes/no due to his hoarse voice and sore throat. Pt kept fair eye sight and moved in short, sudden movements.  Pt continually stroked the end of his beard. Pt's thought processes were coherent and relevant. Pt's mood was depressed and somewhat irritable. Pt's flat affect was congruent with mood. Pt was oriented x 4.   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 Diabetes mellitus without complication (HCC)   . Hypertension   . Schizophrenia (HCC)   . CHF (congestive heart failure) (HCC)   . Neuropathy (HCC)   . Polysubstance abuse   . Cocaine abuse   . Homelessness   .  Hepatitis C   . Chronic foot pain   . Homelessness     History reviewed. No pertinent past surgical history.  Family History:  Family History  Problem Relation Age of Onset  . Hypertension Other   . Diabetes Other     Social History:  reports that he has been smoking Cigarettes.  He has a 20 pack-year smoking history. He uses smokeless tobacco. He reports that he drinks alcohol. He reports that he uses illicit drugs ("Crack" cocaine, Cocaine, and Marijuana) about 7 times per week.  Additional Social History:  Alcohol / Drug Use History of alcohol / drug use?: Yes Substance #1 Name of Substance 1: Crack Cocaine 1 - Age of First  Use: 30's 1 - Amount (size/oz): 1/2 gram down from 1 gram 1 - Frequency: daily 1 - Duration: ongoing 1 - Last Use / Amount: today Substance #2 Name of Substance 2: Nicotine 2 - Amount (size/oz): 1 pack 2 - Frequency: daily 2 - Duration: ongoing 2 - Last Use / Amount: today Substance #3 Name of Substance 3: Marijuana 3 - Age of First Use: teens 3 - Amount (size/oz): 1 joint maximum 3 - Frequency: 1 x month down from daily 3 - Duration: ongoing 3 - Last Use / Amount: last week Substance #4 Name of Substance 4: Alcohol 4 - Age of First Use: teens 4 - Amount (size/oz): 1 glass vodka 4 - Frequency: 1 x month 4 - Duration: ongoing 4 - Last Use / Amount: 2 weeks ago  CIWA: CIWA-Ar BP: 157/80 mmHg Pulse Rate: 101 COWS:    PATIENT STRENGTHS: (choose at least two) Average or above average intelligence Supportive family/friends  Allergies:  Allergies  Allergen Reactions  . Haldol [Haloperidol] Other (See Comments)    Muscle spasms, loss of voluntary movement. However, pt has taken Thorazine on multiple occasions with no adverse effects.     Home Medications:  (Not in a hospital admission)  OB/GYN Status:  No LMP for male patient.  General Assessment Data [facility] Assessment: In system Is this a Tele or Face-to-Face Assessment?: Tele Assessment Is this an Initial Assessment or a Re-assessment for this encounter?: Initial Assessment Marital status: Single Maiden name: na Is patient pregnant?: No Pregnancy Status: No Living Arrangements: Other (Comment) (Homeless) Can pt return to current living arrangement?: Yes Admission Status: Voluntary Is patient capable of signing voluntary admission?: Yes Referral Source: Self/Family/Friend Insurance type: Medicare  Medical Screening Exam [facility] Walk-in ONLY) Medical Exam completed:  (labs incomplete)  Crisis Care Plan Living Arrangements: Other (Comment) (Homeless) Name of Psychiatrist:  Transport planner Name of Therapist: None  Education Status Is patient currently in school?: No Current Grade: na Highest grade of school patient has completed: 12 Name of school: na Contact person: na  Risk to self with the past 6 months Suicidal Ideation: Yes-Currently Present Has patient been a risk to self within the past 6 months prior to admission? : Yes Suicidal Intent:  (denies) Has patient had any suicidal intent within the past 6 months prior to admission? : Yes Is patient at risk for suicide?: Yes Suicidal Plan?: No (denies plan) Has patient had any suicidal plan within the past 6 months prior to admission? : Yes Access to Means: No (denies) What has been your use of drugs/alcohol within the last 12 months?: daily use Previous Attempts/Gestures: Yes How many times?:  (multiple) Triggers for Past Attempts: None known, Unpredictable Intentional Self Injurious Behavior: None Family Suicide History: No Recent  stressful life event(s): Other (Comment) (Homelessness) Persecutory voices/beliefs?: Yes Depression: Yes Depression Symptoms: Insomnia, Tearfulness, Isolating, Fatigue, Guilt, Loss of interest in usual pleasures, Feeling worthless/self pity, Feeling angry/irritable Substance abuse history and/or treatment for substance abuse?: Yes Suicide prevention information given to non-admitted patients: Not applicable  Risk to Others within the past 6 months Homicidal Ideation: No (denies) Does patient have any lifetime risk of violence toward others beyond the six months prior to admission? : No Thoughts of Harm to Others: No (denies) Current Homicidal Intent: No (denies) Current Homicidal Plan: No (denies) Access to Homicidal Means: No (denies) Identified Victim: na History of harm to others?: No (denies) Assessment of Violence: None Noted Does patient have access to weapons?: No (denies) Criminal Charges Pending?: Yes Describe Pending Criminal Charges: possession of Cocaine; B&E  of motor vehicle Does patient have a court date: Yes Court Date:  (Nov 2016. sts does not remember the date) Is patient on probation?: No (denies)  Psychosis Hallucinations: None noted (denies) Delusions: None noted  Mental Status Report Appearance/Hygiene: Disheveled, Poor hygiene, In scrubs Eye Contact: Fair Motor Activity: Restlessness, Mannerisms (continually stroking tip of beard) Speech: Logical/coherent, Rapid, Pressured, Soft (hoarse voice & sore throat) Level of Consciousness: Quiet/awake, Irritable Mood: Depressed, Suspicious, Irritable Affect: Anxious, Depressed, Blunted Anxiety Level: Minimal Thought Processes: Coherent, Relevant (Some flight of ideas) Judgement: Partial Orientation: Person, Place, Time, Situation Obsessive Compulsive Thoughts/Behaviors: None  Cognitive Functioning Concentration: Fair Memory: Unable to Assess (not able to talk very well- short answers) IQ: Average Insight: Poor Impulse Control: Poor Appetite: Fair Weight Loss: 10 (in 1 month) Weight Gain: 0 Sleep: No Change Total Hours of Sleep: 1 (1-2 hours; was falling asleep sitting up in the chair) Vegetative Symptoms: None  ADLScreening [facility] Assessment Services) Patient's cognitive ability adequate to safely complete daily activities?: Yes Patient able to express need for assistance with ADLs?: Yes Independently performs ADLs?: Yes (appropriate for developmental age)  Prior Inpatient Therapy Prior Inpatient Therapy: Yes Prior Therapy Dates: multiple Prior Therapy Facilty/Provider(s): various Reason for Treatment: schizophrenia, SI/HI  Prior Outpatient Therapy Prior Outpatient Therapy: Yes Prior Therapy Dates: sporatically Prior Therapy Facilty/Provider(s): [name] Reason for Treatment: med mgmt Does patient have an ACCT team?: No (Care plan) Does patient have Intensive In-House Services?  : No Does patient have [name] services? : Yes Does patient have P4CC services?: No  ADL  Screening (condition at time of admission) Patient's cognitive ability adequate to safely complete daily activities?: Yes Patient able to express need for assistance with ADLs?: Yes Independently performs ADLs?: Yes (appropriate for developmental age)       Abuse/Neglect Assessment (Assessment to be complete while patient is alone) Physical Abuse: Denies Verbal Abuse: Denies Sexual Abuse: Denies Exploitation of patient/patient's resources: Denies Self-Neglect: Denies     Merchant navy officerAdvance Directives (For Healthcare) Does patient have an advance directive?: No Would patient like information on creating an advanced directive?: No - patient declined information    Additional Information 1:1 In Past 12 Months?: No CIRT Risk: No Elopement Risk: No Does patient have medical clearance?:  (labs incomplete)     Disposition:  Disposition Initial Assessment Completed for this Encounter: Yes Disposition of Patient: Other dispositions (Pending review w [facility] Extender) Type of inpatient treatment program: Adult Other disposition(s): Other (Comment) Patient referred to: Other (Comment)  Discussed with Donell SievertSpencer Simon, PA: Recommend AM re-evaluation by psychiatry. Follow-up with SW. Pt has a Care Plan.   [facility]ina[facility] Advised of recommendation.  He agreed.  Beryle Flock, MS, CRC, [facility] Triage Specialist [facility] 05/09/2015 4:51 AM

## 2015-05-09 NOTE — Discharge Instructions (Signed)
Major Depressive Disorder Major depressive disorder is a mental illness. It also may be called clinical depression or unipolar depression. Major depressive disorder usually causes feelings of sadness, hopelessness, or helplessness. Some people with this disorder do not feel particularly sad but lose interest in doing things they used to enjoy (anhedonia). Major depressive disorder also can cause physical symptoms. It can interfere with work, school, relationships, and other normal everyday activities. The disorder varies in severity but is longer lasting and more serious than the sadness we all feel from time to time in our lives. Major depressive disorder often is triggered by stressful life events or major life changes. Examples of these triggers include divorce, loss of your job or home, a move, and the death of a family member or close friend. Sometimes this disorder occurs for no obvious reason at all. People who have family members with major depressive disorder or bipolar disorder are at higher risk for developing this disorder, with or without life stressors. Major depressive disorder can occur at any age. It may occur just once in your life (single episode major depressive disorder). It may occur multiple times (recurrent major depressive disorder). SYMPTOMS People with major depressive disorder have either anhedonia or depressed mood on nearly a daily basis for at least 2 weeks or longer. Symptoms of depressed mood include:  Feelings of sadness (blue or down in the dumps) or emptiness.  Feelings of hopelessness or helplessness.  Tearfulness or episodes of crying (may be observed by others).  Irritability (children and adolescents). In addition to depressed mood or anhedonia or both, people with this disorder have at least four of the following symptoms:  Difficulty sleeping or sleeping too much.   Significant change (increase or decrease) in appetite or weight.   Lack of energy or  motivation.  Feelings of guilt and worthlessness.   Difficulty concentrating, remembering, or making decisions.  Unusually slow movement (psychomotor retardation) or restlessness (as observed by others).   Recurrent wishes for death, recurrent thoughts of self-harm (suicide), or a suicide attempt. People with major depressive disorder commonly have persistent negative thoughts about themselves, other people, and the world. People with severe major depressive disorder may experiencedistorted beliefs or perceptions about the world (psychotic delusions). They also may see or hear things that are not real (psychotic hallucinations). DIAGNOSIS Major depressive disorder is diagnosed through an assessment by your health care provider. Your health care provider will ask aboutaspects of your daily life, such as mood,sleep, and appetite, to see if you have the diagnostic symptoms of major depressive disorder. Your health care provider may ask about your medical history and use of alcohol or drugs, including prescription medicines. Your health care provider also may do a physical exam and blood work. This is because certain medical conditions and the use of certain substances can cause major depressive disorder-like symptoms (secondary depression). Your health care provider also may refer you to a mental health specialist for further evaluation and treatment. TREATMENT It is important to recognize the symptoms of major depressive disorder and seek treatment. The following treatments can be prescribed for this disorder:   Medicine. Antidepressant medicines usually are prescribed. Antidepressant medicines are thought to correct chemical imbalances in the brain that are commonly associated with major depressive disorder. Other types of medicine may be added if the symptoms do not respond to antidepressant medicines alone or if psychotic delusions or hallucinations occur.  Talk therapy. Talk therapy can be  helpful in treating major depressive disorder by providing   support, education, and guidance. Certain types of talk therapy also can help with negative thinking (cognitive behavioral therapy) and with relationship issues that trigger this disorder (interpersonal therapy). A mental health specialist can help determine which treatment is best for you. Most people with major depressive disorder do well with a combination of medicine and talk therapy. Treatments involving electrical stimulation of the brain can be used in situations with extremely severe symptoms or when medicine and talk therapy do not work over time. These treatments include electroconvulsive therapy, transcranial magnetic stimulation, and vagal nerve stimulation.   This information is not intended to replace advice given to you by your health care provider. Make sure you discuss any questions you have with your health care provider.   Document Released: 10/12/2012 Document Revised: 07/08/2014 Document Reviewed: 10/12/2012 Elsevier Interactive Patient Education 2016 Else[employer] Inc.  

## 2015-05-09 NOTE — ED Notes (Signed)
Bed: [facility] Expected date:  Expected time:  Means of arrival:  Comments: suicidal

## 2015-05-09 NOTE — ED Notes (Signed)
Pt called out because his nose was cold and on the way to the hospital stated that he was off his meds and wanted to kill himself. Pt did not explain a plan. Ambulatory, alert and oriented.

## 2015-05-09 NOTE — ED Notes (Signed)
Pt was provided with multiple of gram crackers, orange juices, and peanut butter.

## 2015-05-09 NOTE — ED Provider Notes (Signed)
CSN: [id number]     Arrival date & time 05/09/15  0212 History  By signing my name below, I, Lyndel Safe, attest that this documentation has been prepared under the direction and in the presence of Gilda Crease, MD. Electronically Signed: Lyndel Safe, ED Scribe. 05/09/2015. 3:11 AM.   Chief Complaint  Patient presents with  . Suicidal   The history is provided by the patient. No language interpreter was used.   HPI Comments: [name] is a 54 y.o. male, with a significant PMhx, who presents to the Emergency Department complaining of a constant, moderate sore throat that has been present throughout the day today. He also notes SI X 1 day. Pt has taken Thorazine in the past and is requesting this medication tonight as well as food. The pt has been evaluated in the ED multiple times in the past for the same c/o SI with the most recent visit being 3 weeks ago. Pt is homeless.   Past Medical History  Diagnosis Date  . Diabetes mellitus without complication (HCC)   . Hypertension   . Schizophrenia (HCC)   . CHF (congestive heart failure) (HCC)   . Neuropathy (HCC)   . Polysubstance abuse   . Cocaine abuse   . Homelessness   . Hepatitis C   . Chronic foot pain   . Homelessness    History reviewed. No pertinent past surgical history. Family History  Problem Relation Age of Onset  . Hypertension Other   . Diabetes Other    Social History  Substance Use Topics  . Smoking status: Current Every Day Smoker -- 1.00 packs/day for 20 years    Types: Cigarettes  . Smokeless tobacco: Current User  . Alcohol Use: Yes     Comment: Daily Drinker     Review of Systems  HENT: Positive for sore throat.   Psychiatric/Behavioral: Positive for suicidal ideas.  All other systems reviewed and are negative.  Allergies  Haldol  Home Medications   Prior to Admission medications   Medication Sig Start Date End Date Taking? Authorizing Provider  amantadine (SYMMETREL)  100 MG capsule Take 1 capsule (100 mg total) by mouth 2 (two) times daily. Patient not taking: Reported on 05/09/2015 04/15/15   Earney Navy, NP  ARIPiprazole (ABILIFY) 5 MG tablet Take 1 tablet (5 mg total) by mouth 2 (two) times daily after a meal. Patient not taking: Reported on 05/09/2015 04/15/15   Earney Navy, NP   BP 157/80 mmHg  Pulse 101  Temp(Src) 98.8 F (37.1 C) (Oral)  Resp 20  SpO2 100% Physical Exam  Constitutional: He is oriented to person, place, and time. He appears well-developed and well-nourished. No distress.  HENT:  Head: Normocephalic and atraumatic.  Right Ear: Hearing normal.  Left Ear: Hearing normal.  Nose: Nose normal.  Mouth/Throat: Mucous membranes are normal. Posterior oropharyngeal erythema present.  Voice hoarse, mild erythema in throat.   Eyes: Conjunctivae and EOM are normal. Pupils are equal, round, and reactive to light.  Neck: Normal range of motion. Neck supple.  Cardiovascular: Normal rate, regular rhythm, S1 normal, S2 normal and normal heart sounds.  Exam reveals no gallop and no friction rub.   No murmur heard. Pulmonary/Chest: Effort normal and breath sounds normal. No respiratory distress. He exhibits no tenderness.  Abdominal: Soft. Normal appearance and bowel sounds are normal. There is no hepatosplenomegaly. There is no tenderness. There is no rebound, no guarding, no tenderness at McBurney's point and negative Murphy's  sign. No hernia.  Musculoskeletal: Normal range of motion.  Neurological: He is alert and oriented to person, place, and time. He has normal strength. No cranial nerve deficit or sensory deficit. Coordination normal. GCS eye subscore is 4. GCS verbal subscore is 5. GCS motor subscore is 6.  Skin: Skin is warm, dry and intact. No rash noted. No cyanosis.  Psychiatric: He has a normal mood and affect. His speech is normal and behavior is normal. Thought content normal.  SI.   Nursing note and vitals  reviewed.   ED Course  Procedures  DIAGNOSTIC STUDIES: Oxygen Saturation is 100% on RA, normal by my interpretation.    COORDINATION OF CARE: 3:02 AM Discussed treatment plan with pt at bedside and pt agreed to plan.  Labs Review Labs Reviewed  CBC - Abnormal; Notable for the following:    RBC 3.85 (*)    Hemoglobin 11.2 (*)    HCT 35.0 (*)    All other components within normal limits  COMPREHENSIVE METABOLIC PANEL  ETHANOL  SALICYLATE LEVEL  ACETAMINOPHEN LEVEL  URINE RAPID DRUG SCREEN, HOSP PERFORMED   I have personally reviewed and evaluated these lab results as part of my medical decision-making.   MDM   Final diagnoses:  None   depression  URI  Presents to the ER for evaluation of depression. Patient reports that he has been feeling suicidal since yesterday. He does not admit to any plan. Patient also complaining of sore throat. He does have mild hoarse voice with erythema of his oropharyngeal region. He has nasal congestion. This presentation is consistent with URI.  I personally performed the services described in this documentation, which was scribed in my presence. The recorded information has been reviewed and is accurate.   Gilda Creasehristopher J Charelle Petrakis, MD 05/09/15 [phone]

## 2015-05-27 ENCOUNTER — Encounter (HOSPITAL_COMMUNITY): Payer: Self-pay

## 2015-05-27 ENCOUNTER — Encounter (HOSPITAL_COMMUNITY): Payer: Self-pay | Admitting: Vascular Surgery

## 2015-05-27 ENCOUNTER — Emergency Department (HOSPITAL_COMMUNITY)
Admission: EM | Admit: 2015-05-27 | Discharge: 2015-05-28 | Disposition: A | Discharge status: Home / Self Care | Payer: Medicare Other | Source: Home / Self Care | Attending: Emergency Medicine | Admitting: Emergency Medicine

## 2015-05-27 ENCOUNTER — Emergency Department (HOSPITAL_COMMUNITY)
Admission: EM | Admit: 2015-05-27 | Discharge: 2015-05-27 | Disposition: A | Discharge status: Home / Self Care | Payer: Medicare Other | Attending: Emergency Medicine | Admitting: Emergency Medicine

## 2015-05-27 DIAGNOSIS — F1410 Cocaine abuse, uncomplicated: Secondary | ICD-10-CM

## 2015-05-27 DIAGNOSIS — Z5900 Homelessness unspecified: Secondary | ICD-10-CM

## 2015-05-27 DIAGNOSIS — R630 Anorexia: Secondary | ICD-10-CM | POA: Insufficient documentation

## 2015-05-27 DIAGNOSIS — I509 Heart failure, unspecified: Secondary | ICD-10-CM | POA: Insufficient documentation

## 2015-05-27 DIAGNOSIS — Z8619 Personal history of other infectious and parasitic diseases: Secondary | ICD-10-CM | POA: Insufficient documentation

## 2015-05-27 DIAGNOSIS — Z8659 Personal history of other mental and behavioral disorders: Secondary | ICD-10-CM | POA: Insufficient documentation

## 2015-05-27 DIAGNOSIS — F1420 Cocaine dependence, uncomplicated: Secondary | ICD-10-CM | POA: Insufficient documentation

## 2015-05-27 DIAGNOSIS — F17210 Nicotine dependence, cigarettes, uncomplicated: Secondary | ICD-10-CM | POA: Insufficient documentation

## 2015-05-27 DIAGNOSIS — Z950 Presence of cardiac pacemaker: Secondary | ICD-10-CM

## 2015-05-27 DIAGNOSIS — E119 Type 2 diabetes mellitus without complications: Secondary | ICD-10-CM | POA: Insufficient documentation

## 2015-05-27 DIAGNOSIS — I10 Essential (primary) hypertension: Secondary | ICD-10-CM | POA: Insufficient documentation

## 2015-05-27 DIAGNOSIS — G8929 Other chronic pain: Secondary | ICD-10-CM

## 2015-05-27 DIAGNOSIS — R443 Hallucinations, unspecified: Secondary | ICD-10-CM | POA: Insufficient documentation

## 2015-05-27 DIAGNOSIS — G479 Sleep disorder, unspecified: Secondary | ICD-10-CM

## 2015-05-27 DIAGNOSIS — R45851 Suicidal ideations: Secondary | ICD-10-CM

## 2015-05-27 DIAGNOSIS — F1210 Cannabis abuse, uncomplicated: Secondary | ICD-10-CM

## 2015-05-27 DIAGNOSIS — M25511 Pain in right shoulder: Secondary | ICD-10-CM | POA: Insufficient documentation

## 2015-05-27 LAB — CBC WITH DIFFERENTIAL/PLATELET
BASOS ABS: 0 10*3/uL (ref 0.0–0.1)
Basophils Relative: 0 %
EOS PCT: 1 %
Eosinophils Absolute: 0.1 10*3/uL (ref 0.0–0.7)
HEMATOCRIT: 34.5 % — AB (ref 39.0–52.0)
Hemoglobin: 11.1 g/dL — ABNORMAL LOW (ref 13.0–17.0)
LYMPHS ABS: 1 10*3/uL (ref 0.7–4.0)
LYMPHS PCT: 12 %
MCH: 29.5 pg (ref 26.0–34.0)
MCHC: 32.2 g/dL (ref 30.0–36.0)
MCV: 91.8 fL (ref 78.0–100.0)
MONO ABS: 0.5 10*3/uL (ref 0.1–1.0)
MONOS PCT: 6 %
NEUTROS ABS: 6.7 10*3/uL (ref 1.7–7.7)
Neutrophils Relative %: 81 %
PLATELETS: 229 10*3/uL (ref 150–400)
RBC: 3.76 MIL/uL — ABNORMAL LOW (ref 4.22–5.81)
RDW: 13.2 % (ref 11.5–15.5)
WBC: 8.3 10*3/uL (ref 4.0–10.5)

## 2015-05-27 LAB — I-STAT CHEM 8, ED
BUN: 12 mg/dL (ref 6–20)
CALCIUM ION: 1.19 mmol/L (ref 1.12–1.23)
CREATININE: 0.8 mg/dL (ref 0.61–1.24)
Chloride: 105 mmol/L (ref 101–111)
GLUCOSE: 149 mg/dL — AB (ref 65–99)
HCT: 36 % — ABNORMAL LOW (ref 39.0–52.0)
HEMOGLOBIN: 12.2 g/dL — AB (ref 13.0–17.0)
POTASSIUM: 3.4 mmol/L — AB (ref 3.5–5.1)
Sodium: 144 mmol/L (ref 135–145)
TCO2: 27 mmol/L (ref 0–100)

## 2015-05-27 LAB — ETHANOL: Alcohol, Ethyl (B): <5 mg/dL (ref <5)

## 2015-05-27 NOTE — ED Notes (Signed)
Pt ambulating independently w/ steady gait on d/c in no acute distress, A&Ox4. D/c instructions reviewed w/ pt - pt denies any further questions or concerns at present.  

## 2015-05-27 NOTE — ED Notes (Signed)
Staffing called and informed of need of sitter.

## 2015-05-27 NOTE — Discharge Instructions (Signed)
°Emergency Department Resource Guide °1) Find a Doctor and Pay Out of Pocket °Although you won't have to find out who is covered by your insurance plan, it is a good idea to ask around and get recommendations. You will then need to call the office and see if the doctor you have chosen will accept you as a new patient and what types of options they offer for patients who are self-pay. Some doctors offer discounts or will set up payment plans for their patients who do not have insurance, but you will need to ask so you aren't surprised when you get to your appointment. ° °2) Contact Your Local Health Department °Not all health departments have doctors that can see patients for sick visits, but many do, so it is worth a call to see if yours does. If you don't know where your local health department is, you can check in your phone book. The CDC also has a tool to help you locate your state's health department, and many state websites also have listings of all of their local health departments. ° °3) Find a Walk-in Clinic °If your illness is not likely to be very severe or complicated, you may want to try a walk in clinic. These are popping up all over the country in pharmacies, drugstores, and shopping centers. They're usually staffed by nurse practitioners or physician assistants that have been trained to treat common illnesses and complaints. They're usually fairly quick and inexpensive. However, if you have serious medical issues or chronic medical problems, these are probably not your best option. ° °No Primary Care Doctor: °- Call [facility] at  832-8000 - they can help you locate a primary care doctor that  accepts your insurance, provides certain services, etc. °- Physician Referral Service- 1-800-533-3463 ° °Chronic Pain Problems: °Organization         Address  Phone   Notes  °[facility]  (336) 297-2271 Patients need to be referred by their primary care doctor.  ° °Medication  Assistance: °Organization         Address  Phone   Notes  °[place] [facility] [address] °[place], [place] [address] (336) 641-8030 --Must be a resident of [place] °-- Must have NO insurance coverage whatsoever (no Medicaid/ Medicare, etc.) °-- The pt. MUST have a primary care doctor that directs their care regularly and follows them in the community °  °[facility]  (866) 331-1348   °[employer]  (888) 892-1162   ° °Agencies that provide inexpensive medical care: °Organization         Address  Phone   Notes  °[facility]  (336) 832-8035   °[facility]    (336) 832-7272   °[facility] [address] °[place], [place] [address] (336) 832-4777   °[facility] [address], °[place] (336) 271-4999   °[facility]    (336) 373-0678   °[facility]    (336) 272-1050   °[facility] ° [address], [place] Phone:  (336) 832-4444, Fax:  (336) 832-4440 Hours of Operation:  9 am - 6 pm, M-F.  Also accepts Medicaid/Medicare and self-pay.  °[facility] ° [address], [place] Phone: (336) 832-3150, Fax: (336) 832-3151. Hours of Operation:  8:30 am - 5:30 pm, M-F.  Also accepts Medicaid and self-pay.  °[facility] [place] [address], [place] Phone: (336) 878-6027   °[facility] [address], [place], [place] (336)723-1848, Ext. 123 Mondays & Thursdays: 7-9 AM.  First 15 patients are seen on a first come, first serve basis. °  ° °Medicaid-accepting [place] Providers: ° °Organization         Address  Phone   Notes  °[facility] [address], [place] (336) 641-2100 Also accepts self-pay patients.  °[facility] [address], [place] ° (336) 856-9996   °[facility] [address], [place]  (336) 288-8857   °[facility] [address] [place] [address], [place] (336) 299-7000   °Veita Bland [address], [place]  ° (336) 373-1557 Only accepts [place] Access Medicaid patients after they have their name applied to their card.  ° °Self-Pay (no insurance) in [place]: ° °Organization         Address  Phone   Notes  °Sickle Cell Patients, [facility] [address], [place] (336) 832-1970   °[facility] [address], [place] (336) 832-4400   °[facility] ° [address] [place] [address], [place] (336) 992-4800   °[facility]/Dr. Osei-Bonsu ° [address] [place] [address], [place] or [address], [place] (336) 841-8500 Phone number for both [place] and [place] locations is the same.  °[facility] [address], [place] (336) 299-0000   °[facility] [address] [place] [address], [place] or [address] (336) 852-7530 °(336) 878-2260   °[facility] [address], [place] (336) 350-1642, phone; (336) 294-5005, fax Sees patients 1st and 3rd Saturday of every month.  Must not qualify for public or private insurance (i.e. Medicaid, Medicare, [place] Health Choice, Veterans' Benefits) • Household income should be no more than 200% of the poverty level •The clinic cannot treat you if you are pregnant or think you are pregnant • Sexually transmitted diseases are not treated at the clinic.  ° ° °Dental Care: °Organization         Address  Phone  Notes  °[place] [facility] [address], [place] (336) 641-6152 Accepts children up to age 21 who are enrolled in Medicaid or [place] Health Choice; pregnant women with a Medicaid card; and children who have applied for Medicaid or [place] Health Choice, but were declined, whose parents can pay a reduced fee at time of service.  °[place]  [facility] [place]  [address], [place] (336) 641-7733 Accepts children up to age 21 who are enrolled in Medicaid or [place] Health Choice; pregnant women with a Medicaid card; and children who have applied for Medicaid or [place] Health Choice, but were declined, whose parents can pay a reduced fee at time of service.  °[facility] ° [address], [place] (336) 641-4533 Patients are seen by appointment only. Walk-ins are not accepted. [facility] will see patients 18 years of age and older. °Monday - Tuesday (8am-5pm) °Most Wednesdays (8:30-5pm) °$30 per visit, cash only  °[facility] ° [address], [place] (336) 641-4533 Patients are seen by appointment only. Walk-ins are not accepted. [facility] will see patients 18 years of age and older. °One   Wednesday Evening (Monthly: Volunteer Based).  $30 per visit, cash only  °[facility]  (919) 537-3737 for adults; Children under age 4, call Graduate Pediatric Dentistry at (919) [phone]. Children aged 4-14, please call (919) [phone] to request a pediatric application. ° Dental services are provided in all areas of dental care including fillings, crowns and bridges, complete and partial dentures, implants, gum treatment, root canals, and extractions. Preventive care is also provided. Treatment is provided to both adults and children. °Patients are selected via a lottery and there is often a waiting list. °  °[facility] [address], °[place] ° (336) 763-8833 [email or web address] °  °[facility] [address], [place], [place] (336)723-1848, Ext. 123 Second and Fourth Thursday of each month, opens at 6:30 AM; Clinic ends at 9 AM.  Patients are seen on a first-come first-served basis, and a limited number are seen during each clinic.  ° °[facility] ° [address], [place], [place] (336) 723-7904    Eligibility Requirements °You must have lived in [place], [place], or [place] counties for at least the last three months. °  You cannot be eligible for state or federal sponsored healthcare insurance, including Veterans Administration, Medicaid, or Medicare. °  You generally cannot be eligible for healthcare insurance through your employer.  °  How to apply: °Eligibility screenings are held every Tuesday and Wednesday afternoon from 1:00 pm until 4:00 pm. You do not need an appointment for the interview!  °[facility] [address], [place], [place] 336-631-2330   °[facility]  336-342-8273   °[facility]  336-703-3100   °[facility]  336-570-6415   ° °Behavioral Health Resources in the Community: °Intensive Outpatient Programs °Organization         Address  Phone  Notes  °[facility] [address], [place], [place] 336-878-6098   °[facility] [address], [place], [place] 336-832-9800   °[facility] [address], [place], [place] ° 336-882-2125   °[facility] [address],  °[place], [place] 1-800-853-5163 or 336-641-4981   °Substance Abuse Resources °Organization         Address  Phone  Notes  °[facility]  336-882-2125   °[facility]  336-784-9470   °[facility]  336-285-9073   °[facility]  336-845-3988   °Residential & Outpatient Substance Abuse Program  1-800-659-3381   °Psychological Services °Organization         Address  Phone  Notes  °[facility]  336- 832-9600   °[facility]  336- 378-7881   °[facility] [address], [place] 1-800-853-5163 or 336-641-4981   ° °Mobile Crisis Teams °Organization         Address  Phone  Notes  °[facility], Mobile Crisis Care Unit  1-877-626-1772   °Assertive °[facility] ° [address]  [place], [place] 336-834-9664   °Sharon DeEsch [address] °[place] 336-554-5454   ° °Self-Help/Support Groups °Organization         Address  Phone             Notes  °[facility]  - variety of support groups  336- 373-1402 Call for more information  °Narcotics Anonymous (NA), [facility] [address], °[place]  2 meetings at this location  ° °  Residential Treatment Programs °Organization         Address  Phone  Notes  °[facility] [address],    °[place]  1-866-801-8205   °[facility] ° [address], [place], [place] 704-293-8524   °[facility] [address], [place] 336-845-3988 Admissions: 8am-3pm M-F  °[facility] [address]    °[place], [place] 336-841-1104   °[facility] [address], [place], [place] 336-379-7146   °[facility] [address]  °[place], [place] 336-285-9073   °[facility] - Intensive Outpatient [address], [place], [place] 336-852-3033   °[facility] [address]  °[place], [place] 1-877-615-2722 or 336-784-9470   °[facility] [address] [place], [place] 336-227-7417 Accepts Medicaid  °[facility] [address]  °[place] 1-800-659-3381 Substance Abuse/Addiction Treatment  ° °[place] Behavioral Health Resources °Organization         Address  Phone  Notes  °[facility]  (888) 581-9988   °Julie Brannon, PhD [address] [place], [place]   (336) 349-5553 or (336) 951-0000   °[facility]   [address] °[place], [place] (336) 349-4454   °[facility] [address], [place], [place] (336) 342-8316 Insurance/Medicaid/sponsorship through [facility]  °[facility] [address]                                    [place], [place] (336) 342-8316 Therapy/tele-psych/case    °[facility] [address]  ° [place], [place] (336) 349-2233    °Dr. Arfeen  (336) 349-4544   °[facility] [place]  [employer] [place] [facility] 1) [address],  °2) [address], [place] °3)  [address], [place] (336) 349-3220 °(336) 342-7768 ° °(336) 342-8140   °[place] [facility] (336) 342-1394 or (336) 342-3537 (After Hours)    ° ° °

## 2015-05-27 NOTE — ED Notes (Signed)
Sitter at bedside.

## 2015-05-27 NOTE — ED Notes (Addendum)
Per EMS, Pt complains of right shoulder pain. Pt states that he hit it today and it began hurting. No deformity noted and no bruising or evidence of injury. Pt also complains of productive cough x 2 days with white/yellow sputum. EMS VS 154/86, HR 90, SPO2 99%, RR 18

## 2015-05-27 NOTE — ED Notes (Signed)
Pt reports to the ED via [facility] for eval of bilateral feet pain and SI. Pt also reports suicidal ideations. Pt has active care plan in place. Pt was seen here yesterday for same. Pt alert and oriented at baseline. Resp e/u. Skin warm and dry.

## 2015-05-27 NOTE — ED Provider Notes (Signed)
CSN: [id number]     Arrival date & time 05/27/15  2046 History   First MD Initiated Contact with Patient 05/27/15 2128     Chief Complaint  Patient presents with  . Foot Pain  . Suicidal     (Consider location/radiation/quality/duration/timing/severity/associated sxs/prior Treatment) HPI Comments: Patient is a 54 year old male with a past medical history of diabetes, hypertension, CHF, polysubstance abuse, schizophrenia who presents today with complaint of suicidal ideation. He states that over the last week he has become very fixated on having sex. He states that because he can't get any sex he has been masturbating constantly in thinking about raping women. He states it's just picking him more and more angry and he wants to die. He states he will walk out in front of a moving car. He is not sleeping well and has not been eating regularly. He does have a cousin he can stay with but he is only staying there intermittently. Patient states he uses cocaine and alcohol to try to forget about the strong desire to have sex. He has been using cocaine and alcohol today prior to arrival. Patient was seen early today for shoulder pain but at that time did not report any suicidal thoughts. He stated they started later today. Patient does have a care plan and an act team member however when attempting to contact him the mailbox is full and we are unable to get a hold of anyone. Patient states that he once to be on Thorazine cassettes he only thing that helps him but he is currently getting Abilify which she does not feel works. It is unclear how often he is actually taking this.  Patient told nursing staff in triage she was having foot pain however he says nothing about foot pain during my interview.  The history is provided by the patient.    Past Medical History  Diagnosis Date  . Diabetes mellitus without complication (HCC)   . Hypertension   . Schizophrenia (HCC)   . CHF (congestive heart failure)  (HCC)   . Neuropathy (HCC)   . Polysubstance abuse   . Cocaine abuse   . Homelessness   . Hepatitis C   . Chronic foot pain   . Homelessness    History reviewed. No pertinent past surgical history. Family History  Problem Relation Age of Onset  . Hypertension Other   . Diabetes Other    Social History  Substance Use Topics  . Smoking status: Current Every Day Smoker -- 1.00 packs/day for 20 years    Types: Cigarettes  . Smokeless tobacco: Current User  . Alcohol Use: Yes     Comment: Daily Drinker     Review of Systems  All other systems reviewed and are negative.     Allergies  Haldol  Home Medications   Prior to Admission medications   Medication Sig Start Date End Date Taking? Authorizing Provider  amantadine (SYMMETREL) 100 MG capsule Take 1 capsule (100 mg total) by mouth 2 (two) times daily. Patient not taking: Reported on 05/09/2015 04/15/15   Earney NavyJosephine C Onuoha, NP  ARIPiprazole (ABILIFY) 5 MG tablet Take 1 tablet (5 mg total) by mouth 2 (two) times daily after a meal. Patient not taking: Reported on 05/09/2015 04/15/15   Earney NavyJosephine C Onuoha, NP   BP 163/99 mmHg  Pulse 91  Temp(Src) 98.1 F (36.7 C) (Oral)  Resp 22  Ht 5\' 7"  (1.702 m)  Wt 145 lb (65.772 kg)  BMI 22.71 kg/m2  SpO2  96% Physical Exam  Constitutional: He is oriented to person, place, and time. He appears well-developed and well-nourished. No distress.  Disheveled  HENT:  Head: Normocephalic and atraumatic.  Mouth/Throat: Oropharynx is clear and moist.  Eyes: Conjunctivae and EOM are normal. Pupils are equal, round, and reactive to light.  Neck: Normal range of motion. Neck supple.  Cardiovascular: Normal rate, regular rhythm and intact distal pulses.   No murmur heard. Pulmonary/Chest: Effort normal and breath sounds normal. No respiratory distress. He has no wheezes. He has no rales.  Abdominal: Soft. He exhibits no distension. There is no tenderness. There is no rebound and no  guarding.  Musculoskeletal: Normal range of motion. He exhibits no edema or tenderness.  Neurological: He is alert and oriented to person, place, and time.  Skin: Skin is warm and dry. No rash noted. No erythema.  Psychiatric: He is actively hallucinating. He expresses suicidal ideation. He expresses suicidal plans.  Nursing note and vitals reviewed.   ED Course  Procedures (including critical care time) Labs Review Labs Reviewed  CBC WITH DIFFERENTIAL/PLATELET - Abnormal; Notable for the following:    RBC 3.76 (*)    Hemoglobin 11.1 (*)    HCT 34.5 (*)    All other components within normal limits  I-STAT CHEM 8, ED - Abnormal; Notable for the following:    Potassium 3.4 (*)    Glucose, Bld 149 (*)    Hemoglobin 12.2 (*)    HCT 36.0 (*)    All other components within normal limits  ETHANOL  URINE RAPID DRUG SCREEN, HOSP PERFORMED  CBC  URINE RAPID DRUG SCREEN, HOSP PERFORMED    Imaging Review No results found. I have personally reviewed and evaluated these images and lab results as part of my medical decision-making.   EKG Interpretation None      MDM   Final diagnoses:  None    Patient is a 54 year old male with multiple medical problems who is well-known to emergency room and has a care plan who presents in a complaining of suicidal behavior with known polysubstance abuse.  Pt does not appear intoxicated at this time however he states he is going to walk in front of a car to kill himself.  At this time feel his SI need to be evaluated by TTS.  Attempted to contact [facility] who is in his care plan however no one answered and mailbox was full.  Med screening labs pending.  HD stable at this time.   11:07 PM Pt currently medically clear.  Will have TTS eval.  Gwyneth Sprout, MD 05/27/15 [phone]

## 2015-05-27 NOTE — ED Provider Notes (Signed)
CSN: [id number]     Arrival date & time 05/27/15  0220 History  By signing my name below, I, Randa Evens, attest that this documentation has been prepared under the direction and in the presence of Jola Schmidt, MD. Electronically Signed: Randa Evens, ED Scribe. 05/27/2015. 2:56 AM.    Chief Complaint  Patient presents with  . Shoulder Pain   The history is provided by the patient. No language interpreter was used.   HPI Comments: [name] is a 54 y.o. male brought in by ambulance, who presents to the Emergency Department complaining of bilateral shoulder pain that's worse on the right onset today PTA.  Pt doesn't report any treatments tried PTA. Pt doesn't report numbness or tingling. Pt denies alcohol use. Pt reports cocaine use.  He denies chest pain shortness of breath.  He does report that he hit it earlier today tostaff.  She denies trauma to me.  He requests more chocolate milk at this time.  Denies chest pain.  Denies abdominal pain.  Past Medical History  Diagnosis Date  . Diabetes mellitus without complication ([place])   . Hypertension   . Schizophrenia ([place])   . CHF (congestive heart failure) ([place])   . Neuropathy ([place])   . Polysubstance abuse   . Cocaine abuse   . Homelessness   . Hepatitis C   . Chronic foot pain   . Homelessness    History reviewed. No pertinent past surgical history. Family History  Problem Relation Age of Onset  . Hypertension Other   . Diabetes Other    Social History  Substance Use Topics  . Smoking status: Current Every Day Smoker -- 1.00 packs/day for 20 years    Types: Cigarettes  . Smokeless tobacco: Current User  . Alcohol Use: Yes     Comment: Daily Drinker     Review of Systems  Musculoskeletal: Positive for arthralgias.  Neurological: Negative for numbness.  All other systems reviewed and are negative.     Allergies  Haldol  Home Medications   Prior to Admission medications   Medication Sig Start Date  End Date Taking? Authorizing Provider  amantadine (SYMMETREL) 100 MG capsule Take 1 capsule (100 mg total) by mouth 2 (two) times daily. Patient not taking: Reported on 05/09/2015 04/15/15   Delfin Gant, NP  ARIPiprazole (ABILIFY) 5 MG tablet Take 1 tablet (5 mg total) by mouth 2 (two) times daily after a meal. Patient not taking: Reported on 05/09/2015 04/15/15   Delfin Gant, NP   BP 155/92 mmHg  Pulse 85  Temp(Src) 98.5 F (36.9 C) (Oral)  Resp 16  Ht $R'5\' 9"'eN$  (1.753 m)  Wt 165 lb (74.844 kg)  BMI 24.36 kg/m2  SpO2 96%   Physical Exam  Constitutional: He is oriented to person, place, and time. He appears well-developed and well-nourished.  HENT:  Head: Normocephalic and atraumatic.  Eyes: EOM are normal.  Neck: Normal range of motion.  Cardiovascular: Normal rate, regular rhythm, normal heart sounds and intact distal pulses.   Pulmonary/Chest: Effort normal and breath sounds normal. No respiratory distress.  Abdominal: Soft. He exhibits no distension. There is no tenderness.  Musculoskeletal: Normal range of motion.  Full range of motion bilateral shoulders.  Normal radial pulses present bilaterally  Neurological: He is alert and oriented to person, place, and time.  Skin: Skin is warm and dry.  Psychiatric: He has a normal mood and affect. Judgment normal.  Nursing note and vitals reviewed.   ED  Course  Procedures (including critical care time) DIAGNOSTIC STUDIES: Oxygen Saturation is 96% on RA, adequate by my interpretation.    COORDINATION OF CARE: 3:41 AM-Discussed treatment plan with pt at bedside and pt agreed to plan.     Labs Review Labs Reviewed - No data to display  Imaging Review No results found.    EKG Interpretation None      MDM   Final diagnoses:  None    Patient is overall well-appearing.  Vital signs are normal.  ESR range of motion is bilateral shoulders.  Chest and abdomen are benign.  Medical screening examination completed.   No life-threatening emergency.  Vitals are normal.  I personally performed the services described in this documentation, which was scribed in my presence. The recorded information has been reviewed and is accurate.         Jola Schmidt, MD 05/27/15 [phone]

## 2015-05-27 NOTE — ED Notes (Signed)
Pt resting with eyes closed.  Sitter at bedside 

## 2015-05-28 ENCOUNTER — Emergency Department (HOSPITAL_COMMUNITY)
Admission: EM | Admit: 2015-05-28 | Discharge: 2015-05-28 | Disposition: A | Discharge status: Home / Self Care | Payer: Medicare Other | Attending: Emergency Medicine | Admitting: Emergency Medicine

## 2015-05-28 DIAGNOSIS — I509 Heart failure, unspecified: Secondary | ICD-10-CM | POA: Insufficient documentation

## 2015-05-28 DIAGNOSIS — F17210 Nicotine dependence, cigarettes, uncomplicated: Secondary | ICD-10-CM | POA: Insufficient documentation

## 2015-05-28 DIAGNOSIS — Z5900 Homelessness unspecified: Secondary | ICD-10-CM

## 2015-05-28 DIAGNOSIS — E119 Type 2 diabetes mellitus without complications: Secondary | ICD-10-CM | POA: Insufficient documentation

## 2015-05-28 DIAGNOSIS — M25579 Pain in unspecified ankle and joints of unspecified foot: Secondary | ICD-10-CM | POA: Insufficient documentation

## 2015-05-28 DIAGNOSIS — I10 Essential (primary) hypertension: Secondary | ICD-10-CM | POA: Insufficient documentation

## 2015-05-28 DIAGNOSIS — Z8619 Personal history of other infectious and parasitic diseases: Secondary | ICD-10-CM | POA: Insufficient documentation

## 2015-05-28 DIAGNOSIS — F209 Schizophrenia, unspecified: Secondary | ICD-10-CM | POA: Insufficient documentation

## 2015-05-28 DIAGNOSIS — G8929 Other chronic pain: Secondary | ICD-10-CM | POA: Insufficient documentation

## 2015-05-28 LAB — RAPID URINE DRUG SCREEN, HOSP PERFORMED
Amphetamines: NOT DETECTED
BARBITURATES: NOT DETECTED
Benzodiazepines: NOT DETECTED
Cocaine: POSITIVE — AB
OPIATES: NOT DETECTED
TETRAHYDROCANNABINOL: POSITIVE — AB

## 2015-05-28 NOTE — ED Notes (Signed)
Pt stable, ambulatory, states understanding of discharge instructions 

## 2015-05-28 NOTE — ED Provider Notes (Signed)
2:24 AM Patient is calm and cooperative at this time.  He denies suicidal thoughts.  He has no intentions of harming himself or anyone else.  He would like to go home at this time.  Patient be discharged home in good condition.  This patient is well-known to the emergency department.  I do not believe is a threat to himself or others at this time.  Azalia BilisKevin Kenzee Bassin, MD 05/28/15 [phone]

## 2015-05-28 NOTE — ED Notes (Signed)
Pt refused vital signs.

## 2015-05-28 NOTE — ED Provider Notes (Signed)
CSN: [id number]     Arrival date & time 05/28/15  [id number] History   First MD Initiated Contact with Patient 05/28/15 0521     No chief complaint on file.    HPI Patient has been in the waiting room after recent discharge from the emergency department.  He is homeless.  He was sitting out there.  He checked himself in because he feels like he has nasal congestion and ankle pain.  He's been walking on his ankle.  He denies numbness or tingling.  He denies chest pain shortness of breath.  He was just seen in the emergency department and was discharged from the emergency department by myself.  He has no homicidal or suicidal thoughts.  He has no other complaint at this time.   Past Medical History  Diagnosis Date  . Diabetes mellitus without complication (HCC)   . Hypertension   . Schizophrenia (HCC)   . CHF (congestive heart failure) (HCC)   . Neuropathy (HCC)   . Polysubstance abuse   . Cocaine abuse   . Homelessness   . Hepatitis C   . Chronic foot pain   . Homelessness    No past surgical history on file. Family History  Problem Relation Age of Onset  . Hypertension Other   . Diabetes Other    Social History  Substance Use Topics  . Smoking status: Current Every Day Smoker -- 1.00 packs/day for 20 years    Types: Cigarettes  . Smokeless tobacco: Current User  . Alcohol Use: Yes     Comment: Daily Drinker     Review of Systems  All other systems reviewed and are negative.     Allergies  Haldol  Home Medications   Prior to Admission medications   Medication Sig Start Date End Date Taking? Authorizing Provider  amantadine (SYMMETREL) 100 MG capsule Take 1 capsule (100 mg total) by mouth 2 (two) times daily. Patient not taking: Reported on 05/09/2015 04/15/15   Earney NavyJosephine C Onuoha, NP  ARIPiprazole (ABILIFY) 5 MG tablet Take 1 tablet (5 mg total) by mouth 2 (two) times daily after a meal. Patient not taking: Reported on 05/09/2015 04/15/15   Earney NavyJosephine C Onuoha, NP    There were no vitals taken for this visit. Physical Exam  Constitutional: He is oriented to person, place, and time. He appears well-developed and well-nourished.  HENT:  Head: Normocephalic.  Eyes: EOM are normal.  Neck: Normal range of motion.  Pulmonary/Chest: Effort normal.  Abdominal: He exhibits no distension.  Musculoskeletal: Normal range of motion.  Ambulatory.  Full range of motion bilateral ankles.  Neurological: He is alert and oriented to person, place, and time.  Psychiatric: He has a normal mood and affect.  Nursing note and vitals reviewed.   ED Course  Procedures (including critical care time) Labs Review Labs Reviewed - No data to display  Imaging Review No results found. I have personally reviewed and evaluated these images and lab results as part of my medical decision-making.   EKG Interpretation None      MDM   Final diagnoses:  Homeless    Medical screening examination completed.  No life-threatening emergency.    Azalia BilisKevin Rosie Torrez, MD 05/28/15 [phone]

## 2015-05-28 NOTE — ED Notes (Signed)
TTS was put into pt's room.  Pt had to be woken up to talk to them.  Pt became very angry, yelling and cursing.  Pt calling sitter names and told her to get out;  Security and [employer] called.  Sitter will sit outside of room at this time

## 2015-05-28 NOTE — ED Notes (Signed)
Patient refused vital signs 

## 2015-05-28 NOTE — ED Notes (Signed)
Patient has been in the waiting room since discharge from previous visit.  Dr. Patria Maneampos saw patient in the triage area and discharged the patient.  Prior to being seen patient refused vital signs.

## 2015-05-28 NOTE — BH Assessment (Signed)
Tele Assessment Note   [name] is an 54 y.o. male.  -Clinician reviewed note by Dr. Anitra LauthPlunkett.  Pt came in with complaint of foot pain and SI.  Has plan to kill self by stepping in front of a car.  Feels bad because he lately has been obsessed with sex and has fantasies of raping women.  Patient denies HI.  According to care plan, patient is supposed to have a ACTT team.  Dr. Anitra LauthPlunkett tried to call the number listed in the care plan and could not leave a message as the voice mail was full.  Patient was very drowsy when this clinician saw him.  Patient became irritated about being "asked the same gd questions all the time."  Patient became more belligerent and started yelling at the nurses and sitter.  Said that he wanted the sitter to leave the room.  During this tantrum patient did say that he did not feel he could be safe by himself tonight.  Patient says he does not know anything about an ACTT team.  Patient denied HI but did say he heard voices that told him bad things.  -Clinician discussed patient care with Alberteen SamFran Hobson, NP who recommended an AM psych eval.    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 Diabetes mellitus without complication (HCC)   . Hypertension   . Schizophrenia (HCC)   . CHF (congestive heart failure) (HCC)   . Neuropathy (HCC)   . Polysubstance abuse   . Cocaine abuse   . Homelessness   . Hepatitis C   . Chronic foot pain   . Homelessness     History reviewed. No pertinent past surgical history.  Family History:  Family History  Problem Relation Age of Onset  . Hypertension Other   . Diabetes Other     Social History:  reports that he has been smoking Cigarettes.  He has a 20 pack-year smoking history. He uses smokeless tobacco. He reports that he drinks alcohol. He reports that he uses illicit drugs  ("Crack" cocaine, Cocaine, and Marijuana) about 7 times per week.  Additional Social History:  Alcohol / Drug Use Pain Medications: See PTA medication list Prescriptions: See PTA medication list Over the Counter: see PTA medication list History of alcohol / drug use?: Yes Substance #1 Name of Substance 1: Crack cocaine 1 - Age of First Use: 30's 1 - Amount (size/oz): Half a gram to a whole gram 1 - Frequency: Daily 1 - Duration: On-going 1 - Last Use / Amount: Unknown Substance #2 Name of Substance 2: Marijuana 2 - Age of First Use: Teens 2 - Amount (size/oz): One joint per month 2 - Frequency: Claims monthly use, down from daily 2 - Duration: On-going 2 - Last Use / Amount: Two weeks ago Substance #3 Name of Substance 3: ETOH 3 - Age of First Use: Teens 3 - Amount (size/oz): Varies 3 - Frequency: 4-5 times in a month 3 - Duration: On-going 3 - Last Use / Amount: Unknown  CIWA: CIWA-Ar BP: 163/99 mmHg Pulse Rate: 91 COWS:    PATIENT STRENGTHS: (choose at least two) Average or above average intelligence Capable of independent living Communication skills  Allergies:  Allergies  Allergen Reactions  . Haldol [Haloperidol] Other (See Comments)    Muscle spasms, loss of voluntary movement. However, pt has taken Thorazine on  multiple occasions with no adverse effects.     Home Medications:  (Not in a hospital admission)  OB/GYN Status:  No LMP for male patient.  General Assessment Data Location of Assessment: [facility] ED TTS Assessment: In system Is this a Tele or Face-to-Face Assessment?: Tele Assessment Is this an Initial Assessment or a Re-assessment for this encounter?: Initial Assessment Marital status: Single Is patient pregnant?: No Pregnancy Status: No Living Arrangements: Other (Comment) (Homeless) Can pt return to current living arrangement?: Yes Admission Status: Voluntary Is patient capable of signing voluntary admission?: Yes Referral Source:  Self/Family/Friend Insurance type: [facility]     Crisis Care Plan Living Arrangements: Other (Comment) (Homeless) Name of Psychiatrist: Transport planner Name of Therapist: None  Education Status Is patient currently in school?: No Highest grade of school patient has completed: 12th grade  Risk to self with the past 6 months Suicidal Ideation: Yes-Currently Present Has patient been a risk to self within the past 6 months prior to admission? : Yes Suicidal Intent: Yes-Currently Present Photographer for safety at this time.) Has patient had any suicidal intent within the past 6 months prior to admission? : Yes Is patient at risk for suicide?: Yes Suicidal Plan?: Yes-Currently Present Has patient had any suicidal plan within the past 6 months prior to admission? : Yes Specify Current Suicidal Plan: Step into path of a vehicle Access to Means: Yes Specify Access to Suicidal Means: Traffic What has been your use of drugs/alcohol within the last 12 months?: ETOH, cocaine, THC Previous Attempts/Gestures: Yes How many times?:  (Multiple attempts) Other Self Harm Risks: None noted Triggers for Past Attempts: None known, Unpredictable Intentional Self Injurious Behavior: None Comment - Self Injurious Behavior: None reported Family Suicide History: Unknown Recent stressful life event(s): Other (Comment), Recent negative physical changes (Homelessness and foot pain) Persecutory voices/beliefs?: Yes Depression: Yes Depression Symptoms: Despondent, Feeling angry/irritable, Feeling worthless/self pity, Loss of interest in usual pleasures Substance abuse history and/or treatment for substance abuse?: Yes Suicide prevention information given to non-admitted patients: Not applicable  Risk to Others within the past 6 months Homicidal Ideation: No Does patient have any lifetime risk of violence toward others beyond the six months prior to admission? : No Thoughts of Harm to Others: Yes-Currently  Present Comment - Thoughts of Harm to Others: Threatened the sitter in the room. Current Homicidal Intent: No Current Homicidal Plan: No Describe Current Homicidal Plan: None reported Access to Homicidal Means: No Describe Access to Homicidal Means: None  Identified Victim: No one History of harm to others?: No Assessment of Violence: None Noted Violent Behavior Description: None reported Does patient have access to weapons?: No Criminal Charges Pending?: Yes Describe Pending Criminal Charges: Poss. of cocaine; B&E of vehicle Does patient have a court date: Yes Court Date:  (In November but pt could not remember) Is patient on probation?: No  Psychosis Hallucinations: Auditory (Voices telling him bad things.) Delusions: Persecutory  Mental Status Report Appearance/Hygiene: Disheveled, Poor hygiene, In scrubs Eye Contact: Poor Motor Activity: Freedom of movement, Unremarkable Speech: Incoherent, Loud, Abusive Level of Consciousness: Drowsy, Irritable Mood: Depressed, Despair, Helpless Affect: Threatening Anxiety Level: Moderate Thought Processes: Tangential Judgement: Unimpaired Orientation: Unable to assess Obsessive Compulsive Thoughts/Behaviors: Moderate (Thinking about sex and raping women.)  Cognitive Functioning Concentration: Poor Memory: Recent Impaired, Remote Impaired IQ: Average Insight: Poor Impulse Control: Fair Appetite: Fair Weight Loss: 10 Weight Gain: 0 Sleep: No Change Total Hours of Sleep:  (<4H/D) Vegetative Symptoms: None  ADLScreening [facility] Assessment Services) Patient's  cognitive ability adequate to safely complete daily activities?: Yes Patient able to express need for assistance with ADLs?: Yes Independently performs ADLs?: Yes (appropriate for developmental age)  Prior Inpatient Therapy Prior Inpatient Therapy: Yes Prior Therapy Dates: multiple Prior Therapy Facilty/Provider(s): various Reason for Treatment: schizophrenia,  SI/HI  Prior Outpatient Therapy Prior Outpatient Therapy: Yes Prior Therapy Dates: sporatically Prior Therapy Facilty/Provider(s): [name] Reason for Treatment: med mgmt Does patient have an ACCT team?: Unknown (Pt says he does not have one.) Does patient have Intensive In-House Services?  : No Does patient have [name] services? : Yes Does patient have P4CC services?: No  ADL Screening (condition at time of admission) Patient's cognitive ability adequate to safely complete daily activities?: Yes Is the patient deaf or have difficulty hearing?: No Does the patient have difficulty seeing, even when wearing glasses/contacts?: No Does the patient have difficulty concentrating, remembering, or making decisions?: Yes Patient able to express need for assistance with ADLs?: Yes Does the patient have difficulty dressing or bathing?: No Independently performs ADLs?: Yes (appropriate for developmental age) Does the patient have difficulty walking or climbing stairs?: No Weakness of Legs: Both (Complains of foot pain.) Weakness of Arms/Hands: None  Home Assistive Devices/Equipment Home Assistive Devices/Equipment: None    Abuse/Neglect Assessment (Assessment to be complete while patient is alone) Physical Abuse: Denies Verbal Abuse: Denies Sexual Abuse: Denies Exploitation of patient/patient's resources: Denies Self-Neglect: Denies     Merchant navy officer (For Healthcare) Does patient have an advance directive?: No Would patient like information on creating an advanced directive?: No - patient declined information    Additional Information 1:1 In Past 12 Months?: No CIRT Risk: No Elopement Risk: No Does patient have medical clearance?: Yes     Disposition:  Disposition Initial Assessment Completed for this Encounter: Yes Disposition of Patient: Other dispositions Type of inpatient treatment program: Adult Other disposition(s): Other (Comment) (To be review in AM by  psychiatry) Patient referred to: Other (Comment) (AM psych eval)  Beatriz Stallion Ray 05/28/2015 12:25 AM

## 2015-06-03 ENCOUNTER — Emergency Department (HOSPITAL_COMMUNITY): Payer: Medicare Other

## 2015-06-03 ENCOUNTER — Encounter (HOSPITAL_COMMUNITY): Payer: Self-pay | Admitting: Emergency Medicine

## 2015-06-03 ENCOUNTER — Emergency Department (HOSPITAL_COMMUNITY)
Admission: EM | Admit: 2015-06-03 | Discharge: 2015-06-03 | Disposition: A | Discharge status: Home / Self Care | Payer: Medicare Other | Attending: Emergency Medicine | Admitting: Emergency Medicine

## 2015-06-03 DIAGNOSIS — E119 Type 2 diabetes mellitus without complications: Secondary | ICD-10-CM | POA: Insufficient documentation

## 2015-06-03 DIAGNOSIS — Z0000 Encounter for general adult medical examination without abnormal findings: Secondary | ICD-10-CM

## 2015-06-03 DIAGNOSIS — F209 Schizophrenia, unspecified: Secondary | ICD-10-CM | POA: Insufficient documentation

## 2015-06-03 DIAGNOSIS — Z8619 Personal history of other infectious and parasitic diseases: Secondary | ICD-10-CM | POA: Insufficient documentation

## 2015-06-03 DIAGNOSIS — I10 Essential (primary) hypertension: Secondary | ICD-10-CM | POA: Insufficient documentation

## 2015-06-03 DIAGNOSIS — F1410 Cocaine abuse, uncomplicated: Secondary | ICD-10-CM | POA: Insufficient documentation

## 2015-06-03 DIAGNOSIS — Z590 Homelessness: Secondary | ICD-10-CM | POA: Insufficient documentation

## 2015-06-03 DIAGNOSIS — I509 Heart failure, unspecified: Secondary | ICD-10-CM | POA: Insufficient documentation

## 2015-06-03 DIAGNOSIS — Z8669 Personal history of other diseases of the nervous system and sense organs: Secondary | ICD-10-CM | POA: Insufficient documentation

## 2015-06-03 DIAGNOSIS — F17210 Nicotine dependence, cigarettes, uncomplicated: Secondary | ICD-10-CM | POA: Insufficient documentation

## 2015-06-03 DIAGNOSIS — R05 Cough: Secondary | ICD-10-CM | POA: Insufficient documentation

## 2015-06-03 DIAGNOSIS — G8929 Other chronic pain: Secondary | ICD-10-CM | POA: Insufficient documentation

## 2015-06-03 DIAGNOSIS — R0981 Nasal congestion: Secondary | ICD-10-CM | POA: Insufficient documentation

## 2015-06-03 DIAGNOSIS — J029 Acute pharyngitis, unspecified: Secondary | ICD-10-CM | POA: Insufficient documentation

## 2015-06-03 DIAGNOSIS — R51 Headache: Secondary | ICD-10-CM | POA: Insufficient documentation

## 2015-06-03 DIAGNOSIS — Z008 Encounter for other general examination: Secondary | ICD-10-CM | POA: Insufficient documentation

## 2015-06-03 LAB — COMPREHENSIVE METABOLIC PANEL
ALT: 17 U/L (ref 17–63)
AST: 17 U/L (ref 15–41)
Albumin: 3.3 g/dL — ABNORMAL LOW (ref 3.5–5.0)
Alkaline Phosphatase: 87 U/L (ref 38–126)
Anion gap: 8 (ref 5–15)
BUN: 15 mg/dL (ref 6–20)
CHLORIDE: 107 mmol/L (ref 101–111)
CO2: 27 mmol/L (ref 22–32)
CREATININE: 0.92 mg/dL (ref 0.61–1.24)
Calcium: 9.3 mg/dL (ref 8.9–10.3)
Glucose, Bld: 176 mg/dL — ABNORMAL HIGH (ref 65–99)
POTASSIUM: 3.3 mmol/L — AB (ref 3.5–5.1)
SODIUM: 142 mmol/L (ref 135–145)
Total Bilirubin: 0.7 mg/dL (ref 0.3–1.2)
Total Protein: 6.3 g/dL — ABNORMAL LOW (ref 6.5–8.1)

## 2015-06-03 LAB — CBC
HCT: 34.5 % — ABNORMAL LOW (ref 39.0–52.0)
HEMOGLOBIN: 10.8 g/dL — AB (ref 13.0–17.0)
MCH: 28.6 pg (ref 26.0–34.0)
MCHC: 31.3 g/dL (ref 30.0–36.0)
MCV: 91.5 fL (ref 78.0–100.0)
PLATELETS: 233 10*3/uL (ref 150–400)
RBC: 3.77 MIL/uL — AB (ref 4.22–5.81)
RDW: 13.9 % (ref 11.5–15.5)
WBC: 8.2 10*3/uL (ref 4.0–10.5)

## 2015-06-03 LAB — RAPID URINE DRUG SCREEN, HOSP PERFORMED
Amphetamines: NOT DETECTED
BARBITURATES: NOT DETECTED
BENZODIAZEPINES: NOT DETECTED
COCAINE: POSITIVE — AB
OPIATES: NOT DETECTED
TETRAHYDROCANNABINOL: NOT DETECTED

## 2015-06-03 LAB — SALICYLATE LEVEL

## 2015-06-03 LAB — ACETAMINOPHEN LEVEL: Acetaminophen (Tylenol), Serum: <10 ug/mL — ABNORMAL LOW (ref 10–30)

## 2015-06-03 LAB — ETHANOL

## 2015-06-03 NOTE — ED Provider Notes (Signed)
CSN: [id number]     Arrival date & time 06/03/15  0242 History   First MD Initiated Contact with Patient 06/03/15 0459     Chief Complaint  Patient presents with  . Cough  . Headache  . Nasal Congestion  . Suicidal     (Consider location/radiation/quality/duration/timing/severity/associated sxs/prior Treatment) HPI  Known hx of Schizophrenia - frequen ED use and homelessness - presents with cough, congesiotn and sore throat as well as feeling suicidal - he refuses to give more information on any of this and insists on sleeping the majority of the time.   Past Medical History  Diagnosis Date  . Diabetes mellitus without complication (HCC)   . Hypertension   . Schizophrenia (HCC)   . CHF (congestive heart failure) (HCC)   . Neuropathy (HCC)   . Polysubstance abuse   . Cocaine abuse   . Homelessness   . Hepatitis C   . Chronic foot pain   . Homelessness    History reviewed. No pertinent past surgical history. Family History  Problem Relation Age of Onset  . Hypertension Other   . Diabetes Other    Social History  Substance Use Topics  . Smoking status: Current Every Day Smoker -- 1.00 packs/day for 20 years    Types: Cigarettes  . Smokeless tobacco: Current User  . Alcohol Use: Yes     Comment: Daily Drinker     Review of Systems  All other systems reviewed and are negative.     Allergies  Haldol  Home Medications   Prior to Admission medications   Medication Sig Start Date End Date Taking? Authorizing Provider  amantadine (SYMMETREL) 100 MG capsule Take 1 capsule (100 mg total) by mouth 2 (two) times daily. Patient not taking: Reported on 05/09/2015 04/15/15   Earney NavyJosephine C Onuoha, NP  ARIPiprazole (ABILIFY) 5 MG tablet Take 1 tablet (5 mg total) by mouth 2 (two) times daily after a meal. Patient not taking: Reported on 05/09/2015 04/15/15   Earney NavyJosephine C Onuoha, NP   BP 161/83 mmHg  Pulse 96  Resp 20  SpO2 94% Physical Exam  Constitutional: He appears  well-developed and well-nourished. No distress.  HENT:  Head: Normocephalic and atraumatic.  Mouth/Throat: Oropharynx is clear and moist. No oropharyngeal exudate.  Clear OP, nares clear, no asymetry, Uvular midline.  Eyes: Conjunctivae and EOM are normal. Pupils are equal, round, and reactive to light. Right eye exhibits no discharge. Left eye exhibits no discharge. No scleral icterus.  Neck: Normal range of motion. Neck supple. No JVD present. No thyromegaly present.  Cardiovascular: Normal rate, regular rhythm, normal heart sounds and intact distal pulses.  Exam reveals no gallop and no friction rub.   No murmur heard. Pulmonary/Chest: Effort normal and breath sounds normal. No respiratory distress. He has no wheezes. He has no rales.  Abdominal: Soft. Bowel sounds are normal. He exhibits no distension and no mass. There is no tenderness.  Musculoskeletal: Normal range of motion. He exhibits no edema or tenderness.  Lymphadenopathy:    He has no cervical adenopathy.  Neurological: He is alert. Coordination normal.  Skin: Skin is warm and dry. No rash noted. No erythema.  Psychiatric: He has a normal mood and affect. His behavior is normal.  Nursing note and vitals reviewed.   ED Course  Procedures (including critical care time) Labs Review Labs Reviewed  COMPREHENSIVE METABOLIC PANEL - Abnormal; Notable for the following:    Potassium 3.3 (*)    Glucose, Bld 176 (*)  Total Protein 6.3 (*)    Albumin 3.3 (*)    All other components within normal limits  ACETAMINOPHEN LEVEL - Abnormal; Notable for the following:    Acetaminophen (Tylenol), Serum <10 (*)    All other components within normal limits  CBC - Abnormal; Notable for the following:    RBC 3.77 (*)    Hemoglobin 10.8 (*)    HCT 34.5 (*)    All other components within normal limits  URINE RAPID DRUG SCREEN, HOSP PERFORMED - Abnormal; Notable for the following:    Cocaine POSITIVE (*)    All other components within  normal limits  ETHANOL  SALICYLATE LEVEL    Imaging Review Dg Chest Port 1 View  06/03/2015  CLINICAL DATA:  Cough. EXAM: PORTABLE CHEST 1 VIEW COMPARISON:  01/30/2015 FINDINGS: Borderline cardiomegaly for technique. Aortic tortuosity is chronic and stable. Prominent hilar size, but symmetric and stable. Prominent interstitial markings with suggestion of cephalized blood flow. No effusion or pneumothorax. 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 IMPRESSION: Mild bronchitic or congestive interstitial coarsening. No edema or focal pneumonia. Electronically Signed   By: [name]   On: 06/03/2015 07:46   I have personally reviewed and evaluated these images and lab results as part of my medical decision-making.   EKG Interpretation None      MDM   Final diagnoses:  None    The pt has normal lung sounds - he has no acute findings on exam but is sleeping - appears tired - is known to have malingering behaviour with re: to psych complaints - will need to wake up and be reevluated - at change of shift - care signed out to Dr. Corlis Leak.    Eber Hong, MD 06/03/15 [phone]

## 2015-06-03 NOTE — ED Notes (Signed)
Pt is calm and cooperative at this time. Able to get vital signs.

## 2015-06-03 NOTE — ED Notes (Signed)
Ordered Meal Tray  

## 2015-06-03 NOTE — ED Notes (Signed)
Spoke with MD about patient care. Pt is to remain on Monitor, given meal, and reassessed.

## 2015-06-03 NOTE — ED Notes (Signed)
Purple scrubs given to pt. , staffing notified for pt.'s sitter.

## 2015-06-03 NOTE — ED Notes (Signed)
MD to bedside to discuss d/c. Pt. States ready to d/c. Will provide pt. With [place] sandwich and bus pass.

## 2015-06-03 NOTE — ED Notes (Signed)
Pt. presents with PTAR with multiple complaints : chest pain / chest congestion / cough , headache and  nasal congestion , pt. became very agitated during initial encounter at [facility] , verbally abusive with staff and [employer] ,  stated suicidal ideation during triage but did not disclose plan of suicide.

## 2015-06-03 NOTE — Discharge Instructions (Signed)
°Emergency Department Resource Guide °1) Find a Doctor and Pay Out of Pocket °Although you won't have to find out who is covered by your insurance plan, it is a good idea to ask around and get recommendations. You will then need to call the office and see if the doctor you have chosen will accept you as a new patient and what types of options they offer for patients who are self-pay. Some doctors offer discounts or will set up payment plans for their patients who do not have insurance, but you will need to ask so you aren't surprised when you get to your appointment. ° °2) Contact Your Local Health Department °Not all health departments have doctors that can see patients for sick visits, but many do, so it is worth a call to see if yours does. If you don't know where your local health department is, you can check in your phone book. The CDC also has a tool to help you locate your state's health department, and many state websites also have listings of all of their local health departments. ° °3) Find a Walk-in Clinic °If your illness is not likely to be very severe or complicated, you may want to try a walk in clinic. These are popping up all over the country in pharmacies, drugstores, and shopping centers. They're usually staffed by nurse practitioners or physician assistants that have been trained to treat common illnesses and complaints. They're usually fairly quick and inexpensive. However, if you have serious medical issues or chronic medical problems, these are probably not your best option. ° °No Primary Care Doctor: °- Call [facility] at  832-8000 - they can help you locate a primary care doctor that  accepts your insurance, provides certain services, etc. °- Physician Referral Service- 1-800-533-3463 ° °Chronic Pain Problems: °Organization         Address  Phone   Notes  °[facility]  (336) 297-2271 Patients need to be referred by their primary care doctor.  ° °Medication  Assistance: °Organization         Address  Phone   Notes  °[place] [facility] [address] °[place], [place] [address] (336) 641-8030 --Must be a resident of [place] °-- Must have NO insurance coverage whatsoever (no Medicaid/ Medicare, etc.) °-- The pt. MUST have a primary care doctor that directs their care regularly and follows them in the community °  °[facility]  (866) 331-1348   °[employer]  (888) 892-1162   ° °Agencies that provide inexpensive medical care: °Organization         Address  Phone   Notes  °[facility]  (336) 832-8035   °[facility]    (336) 832-7272   °[facility] [address] °[place], [place] [address] (336) 832-4777   °[facility] [address], °[place] (336) 271-4999   °[facility]    (336) 373-0678   °[facility]    (336) 272-1050   °[facility] ° [address], [place] Phone:  (336) 832-4444, Fax:  (336) 832-4440 Hours of Operation:  9 am - 6 pm, M-F.  Also accepts Medicaid/Medicare and self-pay.  °[facility] ° [address],  Phone: (336) 832-3150, Fax: (336) 832-3151. Hours of Operation:  8:30 am - 5:30 pm, M-F.  Also accepts Medicaid and self-pay.  °[facility] [place] [address], [place] Phone: (336) 878-6027   °[facility] [address], [place], [place] (336)723-1848, Ext. 123 Mondays & Thursdays: 7-9 AM.  First 15 patients are seen on a first come, first serve basis. °  ° °Medicaid-accepting [place] Providers: ° °Organization         Address  Phone   Notes  °[facility] [address], [place] (336) 641-2100 Also accepts self-pay patients.  °[facility] [address], [place] ° (336) 856-9996   °[facility] [address], [place]  (336) 288-8857   °[facility] [address] [place] [address], [place] (336) 299-7000   °Veita Bland [address], [place]  ° (336) 373-1557 Only accepts [place] Access Medicaid patients after they have their name applied to their card.  ° °Self-Pay (no insurance) in [place]: ° °Organization         Address  Phone   Notes  °Sickle Cell Patients, [facility] [address], [place] (336) 832-1970   °[facility] [address], [place] (336) 832-4400   °[facility] ° [address], [place] (336) 992-4800   °[facility]/Dr. Osei-Bonsu ° [address] [place] [address], [place] or [address], [place] (336) 841-8500 Phone number for both [place] and [place] locations is the same.  °[facility] [address], [place] (336) 299-0000   °[facility] [address] [place] [address], [place] or [address] (336) 852-7530 °(336) 878-2260   °[facility] [address], [place] (336) 350-1642, phone; (336) 294-5005, fax Sees patients 1st and 3rd Saturday of every month.  Must not qualify for public or private insurance (i.e. Medicaid, Medicare, [place] Health Choice, Veterans' Benefits) • Household income should be no more than 200% of the poverty level •The clinic cannot treat you if you are pregnant or think you are pregnant • Sexually transmitted diseases are not treated at the clinic.  ° ° °Dental Care: °Organization         Address  Phone  Notes  °[place] [facility] [address],  (336) 641-6152 Accepts children up to age 21 who are enrolled in Medicaid or [place] Health Choice; pregnant women with a Medicaid card; and children who have applied for Medicaid or [place] Health Choice, but were declined, whose parents can pay a reduced fee at time of service.  °[place]  [facility] [place]  [address], [place] (336) 641-7733 Accepts children up to age 21 who are enrolled in Medicaid or [place] Health Choice; pregnant women with a Medicaid card; and children who have applied for Medicaid or  Health Choice, but were declined, whose parents can pay a reduced fee at time of service.  °[facility] ° [address],  (336) 641-4533 Patients are seen by appointment only. Walk-ins are not accepted. [facility] will see patients 18 years of age and older. °Monday - Tuesday (8am-5pm) °Most Wednesdays (8:30-5pm) °$30 per visit, cash only  °[facility] ° [address], [place] (336) 641-4533 Patients are seen by appointment only. Walk-ins are not accepted. [facility] will see patients 18 years of age and older. °One   Wednesday Evening (Monthly: Volunteer Based).  $30 per visit, cash only  °[facility]  (919) 537-3737 for adults; Children under age 4, call Graduate Pediatric Dentistry at (919) [phone]. Children aged 4-14, please call (919) [phone] to request a pediatric application. ° Dental services are provided in all areas of dental care including fillings, crowns and bridges, complete and partial dentures, implants, gum treatment, root canals, and extractions. Preventive care is also provided. Treatment is provided to both adults and children. °Patients are selected via a lottery and there is often a waiting list. °  °[facility] [address], °[place] ° (336) 763-8833 [email or web address] °  °[facility] [address], [place], [place] (336)723-1848, Ext. 123 Second and Fourth Thursday of each month, opens at 6:30 AM; Clinic ends at 9 AM.  Patients are seen on a first-come first-served basis, and a limited number are seen during each clinic.  ° °[facility] ° [address], [place], [place] (336) 723-7904    Eligibility Requirements °You must have lived in [place], [place], or [place] counties for at least the last three months. °  You cannot be eligible for state or federal sponsored healthcare insurance, including Veterans Administration, Medicaid, or Medicare. °  You generally cannot be eligible for healthcare insurance through your employer.  °  How to apply: °Eligibility screenings are held every Tuesday and Wednesday afternoon from 1:00 pm until 4:00 pm. You do not need an appointment for the interview!  °[facility] [address], [place], [place] 336-631-2330   °[facility]  336-342-8273   °[facility]  336-703-3100   °[facility]  336-570-6415   ° °Behavioral Health Resources in the Community: °Intensive Outpatient Programs °Organization         Address  Phone  Notes  °[facility] [address], [place], [place] 336-878-6098   °[facility] [address], [place], [place] 336-832-9800   °[facility] [address], [place], [place] ° 336-882-2125   °[facility] [address],  °[place], [place] 1-800-853-5163 or 336-641-4981   °Substance Abuse Resources °Organization         Address  Phone  Notes  °[facility]  336-882-2125   °[facility]  336-784-9470   °[facility]  336-285-9073   °[facility]  336-845-3988   °Residential & Outpatient Substance Abuse Program  1-800-659-3381   °Psychological Services °Organization         Address  Phone  Notes  °[facility]  336- 832-9600   °[facility]  336- 378-7881   °[facility] [address], [place] 1-800-853-5163 or 336-641-4981   ° °Mobile Crisis Teams °Organization         Address  Phone  Notes  °[facility], Mobile Crisis Care Unit  1-877-626-1772   °Assertive °[facility] ° [address]  [place], [place] 336-834-9664   °Sharon DeEsch [address] °[place] 336-554-5454   ° °Self-Help/Support Groups °Organization         Address  Phone             Notes  °[facility] [place] - variety of support groups  336- 373-1402 Call for more information  °Narcotics Anonymous (NA), [facility] [address], °[place]  2 meetings at this location  ° °  Residential Treatment Programs °Organization         Address  Phone  Notes  °[facility] [address],    °[place]  1-866-801-8205   °[facility] ° [address], [place], [place] 704-293-8524   °[facility] [address], [place] 336-845-3988 Admissions: 8am-3pm M-F  °[facility] [address]    °[place], [place] 336-841-1104   °[facility] [address], [place], [place] 336-379-7146   °[facility] [address]  °[place], [place] 336-285-9073   °[facility] - Intensive Outpatient [address], [place], [place] 336-852-3033   °[facility] [address]  °[place], [place] 1-877-615-2722 or 336-784-9470   °[facility] [address] [place], [place] 336-227-7417 Accepts Medicaid  °[facility] [address]  °[place] 1-800-659-3381 Substance Abuse/Addiction Treatment  ° °[place] Behavioral Health Resources °Organization         Address  Phone  Notes  °[facility]  (888) 581-9988   °Julie Brannon, PhD [address] [place], [place]   (336) 349-5553 or (336) 951-0000   °[facility]   [address] °[place], [place] (336) 349-4454   °[facility] [address], [place], [place] (336) 342-8316 Insurance/Medicaid/sponsorship through [facility]  °[facility] [address]                                    [place], [place] (336) 342-8316 Therapy/tele-psych/case    °[facility] [address]  ° ,  (336) 349-2233    °Dr. Arfeen  (336) 349-4544   °[facility] [place]  [employer] [place] [facility] 1) [address],  °2) [address], [place] °3)  [address], [place] (336) 349-3220 °(336) 342-7768 ° °(336) 342-8140   °[place] [facility] (336) 342-1394 or (336) 342-3537 (After Hours)    ° ° °

## 2015-06-03 NOTE — ED Provider Notes (Signed)
11:04 AM Patient ate a full breakfast. Able to eat and drink without difficulty. Normal vital signs wall resting. We have done reasonable workup here in emergency department showing no acute pathology. Given his normal vital signs, normal labs patient can follow up with primary care provider. We will have him leave the emergency room. Patient with steady ambulation, able to get dressed himself.  Geneal Huebert Randall AnLyn Dhwani Venkatesh, MD 06/03/15 1106

## 2015-06-06 ENCOUNTER — Observation Stay (HOSPITAL_COMMUNITY)
Admission: EM | Admit: 2015-06-06 | Discharge: 2015-06-07 | Disposition: A | Discharge status: Home / Self Care | Payer: Medicare Other | Attending: Emergency Medicine | Admitting: Emergency Medicine

## 2015-06-06 ENCOUNTER — Encounter (HOSPITAL_COMMUNITY): Payer: Self-pay

## 2015-06-06 DIAGNOSIS — Z8619 Personal history of other infectious and parasitic diseases: Secondary | ICD-10-CM | POA: Insufficient documentation

## 2015-06-06 DIAGNOSIS — R45851 Suicidal ideations: Secondary | ICD-10-CM | POA: Insufficient documentation

## 2015-06-06 DIAGNOSIS — E119 Type 2 diabetes mellitus without complications: Secondary | ICD-10-CM | POA: Insufficient documentation

## 2015-06-06 DIAGNOSIS — F1420 Cocaine dependence, uncomplicated: Principal | ICD-10-CM | POA: Insufficient documentation

## 2015-06-06 DIAGNOSIS — F17210 Nicotine dependence, cigarettes, uncomplicated: Secondary | ICD-10-CM | POA: Insufficient documentation

## 2015-06-06 DIAGNOSIS — I10 Essential (primary) hypertension: Secondary | ICD-10-CM | POA: Insufficient documentation

## 2015-06-06 DIAGNOSIS — Z590 Homelessness: Secondary | ICD-10-CM | POA: Insufficient documentation

## 2015-06-06 DIAGNOSIS — F200 Paranoid schizophrenia: Secondary | ICD-10-CM | POA: Insufficient documentation

## 2015-06-06 LAB — COMPREHENSIVE METABOLIC PANEL
ALT: 17 U/L (ref 17–63)
AST: 20 U/L (ref 15–41)
Albumin: 3.4 g/dL — ABNORMAL LOW (ref 3.5–5.0)
Alkaline Phosphatase: 83 U/L (ref 38–126)
Anion gap: 6 (ref 5–15)
BUN: 16 mg/dL (ref 6–20)
CO2: 30 mmol/L (ref 22–32)
Calcium: 8.7 mg/dL — ABNORMAL LOW (ref 8.9–10.3)
Chloride: 107 mmol/L (ref 101–111)
Creatinine, Ser: 0.75 mg/dL (ref 0.61–1.24)
GFR calc Af Amer: >60 mL/min (ref >60)
GFR calc non Af Amer: >60 mL/min (ref >60)
Glucose, Bld: 135 mg/dL — ABNORMAL HIGH (ref 65–99)
Potassium: 3.4 mmol/L — ABNORMAL LOW (ref 3.5–5.1)
Sodium: 143 mmol/L (ref 135–145)
Total Bilirubin: 0.6 mg/dL (ref 0.3–1.2)
Total Protein: 6.4 g/dL — ABNORMAL LOW (ref 6.5–8.1)

## 2015-06-06 LAB — CBC
HCT: 35.8 % — ABNORMAL LOW (ref 39.0–52.0)
Hemoglobin: 11.3 g/dL — ABNORMAL LOW (ref 13.0–17.0)
MCH: 29.2 pg (ref 26.0–34.0)
MCHC: 31.6 g/dL (ref 30.0–36.0)
MCV: 92.5 fL (ref 78.0–100.0)
Platelets: 203 10*3/uL (ref 150–400)
RBC: 3.87 MIL/uL — ABNORMAL LOW (ref 4.22–5.81)
RDW: 14.6 % (ref 11.5–15.5)
WBC: 8.9 10*3/uL (ref 4.0–10.5)

## 2015-06-06 LAB — ETHANOL: Alcohol, Ethyl (B): <5 mg/dL (ref <5)

## 2015-06-06 LAB — ACETAMINOPHEN LEVEL: Acetaminophen (Tylenol), Serum: <10 ug/mL — ABNORMAL LOW (ref 10–30)

## 2015-06-06 LAB — SALICYLATE LEVEL: Salicylate Lvl: <4 mg/dL (ref 2.8–30.0)

## 2015-06-06 NOTE — ED Notes (Signed)
Patient and patient's belongings wanded by security 

## 2015-06-06 NOTE — Progress Notes (Signed)
Entered in d/c instructions  Please use the list of medicare patients to find a doctor for follow up Schedule an appointment as soon as possible for a visit on 06/09/2015

## 2015-06-06 NOTE — ED Notes (Signed)
Patient changed into [name] scrubs.

## 2015-06-06 NOTE — ED Notes (Signed)
Per EMS, pt from [facility] [address]  Pt is homeless.  Noncompliant with meds.  Called by [employer].  Pt snoring on arrival to ED.  Pt states he hears voices and told to kill himself.  Long hx of same.  Voices x years.  Vitals:  180/94, hr 88, resp 16, 98% ra.  cbg 167.

## 2015-06-06 NOTE — ED Provider Notes (Signed)
CSN: [id number]     Arrival date & time 06/06/15  1615 History   First MD Initiated Contact with Patient 06/06/15 1624     Chief Complaint  Patient presents with  . Medical Clearance    HPI   54 year old male presents today with numerous complaints. Patient has been seen in the emergency room 27 times in the last 6 months with various complaints including suicidal ideation. Patient reports today that he's had thoughts of harming himself, reporting that he is going to walk in front of a moving vehicle. Patient also reports that he does not want to be living outside as it is cold and rainy. Patient reports that he does have somewhere to stay but he does not like staying there as everyone in the house is using drugs and he does not want to use drugs. Patient reports that he's been using crack cocaine today, and reports using "a lot" last night. Patient reports he is having pain to his bilateral hands, but would not elaborate on the pain. Patient rambling with somewhat incoherent statements at times.  Past Medical History  Diagnosis Date  . Diabetes mellitus without complication (HCC)   . Hypertension   . Schizophrenia (HCC)   . CHF (congestive heart failure) (HCC)   . Neuropathy (HCC)   . Polysubstance abuse   . Cocaine abuse   . Homelessness   . Hepatitis C   . Chronic foot pain   . Homelessness    History reviewed. No pertinent past surgical history. Family History  Problem Relation Age of Onset  . Hypertension Other   . Diabetes Other    Social History  Substance Use Topics  . Smoking status: Current Every Day Smoker -- 1.00 packs/day for 20 years    Types: Cigarettes  . Smokeless tobacco: Current User  . Alcohol Use: Yes     Comment: Daily Drinker     Review of Systems  All other systems reviewed and are negative.  Allergies  Haldol  Home Medications   Prior to Admission medications   Medication Sig Start Date End Date Taking? Authorizing Provider  amantadine  (SYMMETREL) 100 MG capsule Take 1 capsule (100 mg total) by mouth 2 (two) times daily. Patient not taking: Reported on 05/09/2015 04/15/15   Earney Navy, NP  ARIPiprazole (ABILIFY) 5 MG tablet Take 1 tablet (5 mg total) by mouth 2 (two) times daily after a meal. Patient not taking: Reported on 05/09/2015 04/15/15   Earney Navy, NP   BP 160/90 mmHg  Pulse 99  Temp(Src) 99.1 F (37.3 C) (Oral)  Resp 20  SpO2 91%   Physical Exam  Constitutional: He is oriented to person, place, and time. He appears well-developed and well-nourished.  HENT:  Head: Normocephalic and atraumatic.  Eyes: Conjunctivae are normal. Pupils are equal, round, and reactive to light. Right eye exhibits no discharge. Left eye exhibits no discharge. No scleral icterus.  Neck: Normal range of motion. No JVD present. No tracheal deviation present.  Cardiovascular: Normal rate, regular rhythm, normal heart sounds and intact distal pulses.  Exam reveals no gallop and no friction rub.   No murmur heard. Pulmonary/Chest: Effort normal and breath sounds normal. No stridor.  Abdominal: Soft. Bowel sounds are normal. He exhibits no distension and no mass. There is no tenderness. There is no rebound and no guarding.  Musculoskeletal: He exhibits no edema or tenderness.  Hands atraumatic, nontender palpation, sensation intact  Neurological: He is alert and oriented to person,  place, and time. Coordination normal.  Skin: Skin is warm and dry.  Psychiatric: He has a normal mood and affect. His behavior is normal. Judgment and thought content normal.  Nursing note and vitals reviewed.     ED Course  Procedures (including critical care time) Labs Review Labs Reviewed  COMPREHENSIVE METABOLIC PANEL - Abnormal; Notable for the following:    Potassium 3.4 (*)    Glucose, Bld 135 (*)    Calcium 8.7 (*)    Total Protein 6.4 (*)    Albumin 3.4 (*)    All other components within normal limits  ACETAMINOPHEN LEVEL -  Abnormal; Notable for the following:    Acetaminophen (Tylenol), Serum <10 (*)    All other components within normal limits  CBC - Abnormal; Notable for the following:    RBC 3.87 (*)    Hemoglobin 11.3 (*)    HCT 35.8 (*)    All other components within normal limits  ETHANOL  SALICYLATE LEVEL  URINE RAPID DRUG SCREEN, HOSP PERFORMED    Imaging Review No results found. I have personally reviewed and evaluated these images and lab results as part of my medical decision-making.   EKG Interpretation None      MDM   Final diagnoses:  Suicidal ideations    Labs: Ethanol, salicylate, acetaminophen, CMP, CBC- ethanol, salicylate, acetaminophen, CMP, CBC  Imaging:  Consults:  Therapeutics:  Discharge Meds:   Assessment/Plan: Patient presents with suicidal ideations. He has been seen here 27 times in the last 6 months. Behavioral Health consult and who requested patient be placed in TCU monitored overnight was Behavioral Health assessment in the morning. Patient clinically cleared and stable he be placed in TCU pending reevaluation in the morning.         Eyvonne MechanicJeffrey Agastya Meister, PA-C 06/07/15 [id number]  Lyndal Pulleyaniel Knott, MD 06/07/15 [phone]

## 2015-06-06 NOTE — Progress Notes (Addendum)
Pt confirmed with CM he has not obtained a pcp since last time this CM spoke with him Pt asked CM if she was a SW, CM informed him no Strong urine smell noted when Cm close to pt  Pt given a 6 page list of medicare MDs

## 2015-06-06 NOTE — BH Assessment (Signed)
Tele Assessment Note   [name] is an 54 y.o. male.  -clinician reviewed note by [name], PA.  Patient was picked up by [facility] at the [facility] [facility] on [address].  Patient said that he had thoughts of killing himself by stepping into traffic.  Patient plainly told PA that he was homeless and it was cold and rainy and he did not want to be outside.  Patient reports using crack cocaine today and "a lot of it" last night.  When this clinician talked with patient, he had to encourage patient to awaken several times as patient was snoring.  Patient nods "yes" that he cannot currently contract for safety.  Patient says he would step into traffic to kill himself.  Patient says he is hearing voices that tell him to kill himself.  Patient denies any visual hallucinations and HI.  Patient has been to the ED 27 times in the last 6 months.  Patient has had multiple psychiatric hospitalizations over the last few years.  He reportedly goes to [facility] but it is highly unlikely he is adherent to any medication regimen.  Patient says that he used crack last night and today.  When asked what he wants to do regarding housing, patient says "I want to live in a old folks home or a group home for a few weeks then go back out there."    -Clinician discussed patient care with [name], PA who recommends an AM psych eval for patient.  Clinician called [name], PA and let him know.  He will have patient moved to TCU to be evaluated in AM on 12/07.   Diagnosis:  295.90 Schizophrenia; 304.20 Cocaine use d/o severe  Past Medical History:  Past Medical History  Diagnosis Date  . Diabetes mellitus without complication (HCC)   . Hypertension   . Schizophrenia (HCC)   . CHF (congestive heart failure) (HCC)   . Neuropathy (HCC)   . Polysubstance abuse   . Cocaine abuse   . Homelessness   . Hepatitis C   . Chronic foot pain   . Homelessness     History reviewed. No pertinent  past surgical history.  Family History:  Family History  Problem Relation Age of Onset  . Hypertension Other   . Diabetes Other     Social History:  reports that he has been smoking Cigarettes.  He has a 20 pack-year smoking history. He uses smokeless tobacco. He reports that he drinks alcohol. He reports that he uses illicit drugs ("Crack" cocaine, Cocaine, and Marijuana) about 7 times per week.  Additional Social History:  Alcohol / Drug Use Pain Medications: See PTA medications Prescriptions: See PTA medications Over the Counter: See PTA medications History of alcohol / drug use?: Yes Longest period of sobriety (when/how long): Unknown Substance #1 Name of Substance 1: Crack cocaine 1 - Age of First Use: 30's 1 - Amount (size/oz): Half a gram to a whole gram 1 - Frequency: Daily 1 - Duration: On-going 1 - Last Use / Amount: 06/06/15 Substance #2 Name of Substance 2: Marijuana 2 - Age of First Use: Teens 2 - Amount (size/oz): one joint at a time 2 - Frequency: unknown 2 - Duration: On going 2 - Last Use / Amount: Unknown, UDS not done yet. Substance #3 Name of Substance 3: ETOH 3 - Age of First Use: Teens 3 - Amount (size/oz): Varies 3 - Frequency: Often, 4-5 times in a month 3 - Duration: On-going  3 - Last Use / Amount: Unknown  CIWA: CIWA-Ar BP: 153/86 mmHg Pulse Rate: 96 COWS:    PATIENT STRENGTHS: (choose at least two) Average or above average intelligence Capable of independent living Communication skills  Allergies:  Allergies  Allergen Reactions  . Haldol [Haloperidol] Other (See Comments)    Muscle spasms, loss of voluntary movement. However, pt has taken Thorazine on multiple occasions with no adverse effects.     Home Medications:  (Not in a hospital admission)  OB/GYN Status:  No LMP for male patient.  General Assessment Data [facility] of Assessment: [facility] TTS Assessment: In system Is this a Tele or Face-to-Face Assessment?: Tele  Assessment Is this an Initial Assessment or a Re-assessment for this encounter?: Initial Assessment Marital status: Single Is patient pregnant?: No Pregnancy Status: No Living Arrangements: Other (Comment) (Pt is homeless) Can pt return to current living arrangement?: Yes Admission Status: Voluntary Is patient capable of signing voluntary admission?: Yes Referral Source: Self/Family/Friend Insurance type: [facility]     Crisis Care Plan Living Arrangements: Other (Comment) (Pt is homeless) [facility] Name of Therapist: None  Education Status Is patient currently in school?: No Highest grade of school patient has completed: 12th grade  Risk to self with the past 6 months Suicidal Ideation: Yes-Currently Present Has patient been a risk to self within the past 6 months prior to admission? : Yes Suicidal Intent: Yes-Currently Present (Pt says he cannot contract for safety at this time.) Has patient had any suicidal intent within the past 6 months prior to admission? : Yes Is patient at risk for suicide?: Yes Suicidal Plan?: Yes-Currently Present Has patient had any suicidal plan within the past 6 months prior to admission? : Yes Specify Current Suicidal Plan: Step into traffic and get hit. Access to Means: Yes Specify Access to Suicidal Means: Traffic What has been your use of drugs/alcohol within the last 12 months?: Crack, ETOH, marijuana Previous Attempts/Gestures: Yes How many times?:  (Multiple attempts) Other Self Harm Risks: None noted Triggers for Past Attempts: None known, Unpredictable Intentional Self Injurious Behavior: None Comment - Self Injurious Behavior: None reported Family Suicide History: Unknown Recent stressful life event(s): Other (Comment) (Homeless) Persecutory voices/beliefs?: Yes Depression: Yes Depression Symptoms: Despondent, Isolating, Feeling worthless/self pity Substance abuse history and/or treatment for substance abuse?:  Yes Suicide prevention information given to non-admitted patients: Not applicable  Risk to Others within the past 6 months Homicidal Ideation: No Does patient have any lifetime risk of violence toward others beyond the six months prior to admission? : No Thoughts of Harm to Others: No-Not Currently Present/Within Last 6 Months Comment - Thoughts of Harm to Others: None at this time. Current Homicidal Intent: No Current Homicidal Plan: No Describe Current Homicidal Plan: None reported Access to Homicidal Means: No Describe Access to Homicidal Means: None Identified Victim: No one History of harm to others?: No Assessment of Violence: None Noted Violent Behavior Description: None reported Does patient have access to weapons?: No Criminal Charges Pending?: No Describe Pending Criminal Charges: Unknown Does patient have a court date: No Court Date:  (Unknown ) Is patient on probation?: No (Denies)  Psychosis Hallucinations: Auditory (Voices telling him to kill self) Delusions: Persecutory  Mental Status Report Appearance/Hygiene: Disheveled, Poor hygiene, In scrubs Eye Contact: Poor Motor Activity: Freedom of movement, Unremarkable Speech: Incoherent, Soft Level of Consciousness: Drowsy Mood: Apathetic, Helpless Affect: Blunted Anxiety Level: None Thought Processes: Relevant Judgement: Unable to Assess Orientation: Unable to assess Obsessive Compulsive  Thoughts/Behaviors: Unable to Assess  Cognitive Functioning Concentration: Poor Memory: Recent Impaired, Remote Intact IQ: Average Insight: Poor Impulse Control: Poor Appetite: Fair Weight Loss: 0 Weight Gain: 0 Sleep: No Change Total Hours of Sleep:  (<4H/D) Vegetative Symptoms: None  ADLScreening [facility] Assessment Services) Patient's cognitive ability adequate to safely complete daily activities?: Yes Patient able to express need for assistance with ADLs?: Yes Independently performs ADLs?: Yes (appropriate for  developmental age)  Prior Inpatient Therapy Prior Inpatient Therapy: Yes Prior Therapy Dates: multiple Prior Therapy Facilty/Provider(s): various Reason for Treatment: schizophrenia, SI/HI  Prior Outpatient Therapy Prior Outpatient Therapy: Yes Prior Therapy Dates: sporatically Prior Therapy Facilty/Provider(s): [name] Reason for Treatment: med mgmt Does patient have an ACCT team?: Unknown Does patient have Intensive In-House Services?  : No Does patient have [name] services? : Yes Does patient have P4CC services?: No  ADL Screening (condition at time of admission) Patient's cognitive ability adequate to safely complete daily activities?: Yes Is the patient deaf or have difficulty hearing?: No Does the patient have difficulty seeing, even when wearing glasses/contacts?: No Does the patient have difficulty concentrating, remembering, or making decisions?: Yes Patient able to express need for assistance with ADLs?: Yes Does the patient have difficulty dressing or bathing?: No Independently performs ADLs?: Yes (appropriate for developmental age) Does the patient have difficulty walking or climbing stairs?: No Weakness of Legs: None Weakness of Arms/Hands: None       Abuse/Neglect Assessment (Assessment to be complete while patient is alone) Physical Abuse: Denies Verbal Abuse: Denies Sexual Abuse: Denies Exploitation of patient/patient's resources: Denies Self-Neglect: Denies     Merchant navy officer (For Healthcare) Does patient have an advance directive?: No Would patient like information on creating an advanced directive?: No - patient declined information    Additional Information 1:1 In Past 12 Months?: No CIRT Risk: No Elopement Risk: No Does patient have medical clearance?: Yes     Disposition:  Disposition Initial Assessment Completed for this Encounter: Yes Disposition of Patient: Other dispositions Type of inpatient treatment program: Adult Other  disposition(s): Other (Comment) (Pt to be reviewed with PA) Patient referred to: Other (Comment)  Beatriz Stallion Ray 06/06/2015 9:08 PM

## 2015-06-07 DIAGNOSIS — F1420 Cocaine dependence, uncomplicated: Secondary | ICD-10-CM | POA: Diagnosis not present

## 2015-06-07 DIAGNOSIS — R45851 Suicidal ideations: Secondary | ICD-10-CM | POA: Insufficient documentation

## 2015-06-07 LAB — RAPID URINE DRUG SCREEN, HOSP PERFORMED
Amphetamines: NOT DETECTED
Barbiturates: NOT DETECTED
Benzodiazepines: NOT DETECTED
Cocaine: POSITIVE — AB
Opiates: NOT DETECTED
Tetrahydrocannabinol: NOT DETECTED

## 2015-06-07 MED ORDER — ACETAMINOPHEN 325 MG PO TABS
650.0000 mg | ORAL_TABLET | Freq: Once | ORAL | Status: AC
  Administered 2015-06-07: 650 mg via ORAL
  Filled 2015-06-07: qty 2

## 2015-06-07 MED ORDER — ARIPIPRAZOLE 5 MG PO TABS: 5.0000 mg | ORAL_TABLET | Freq: Two times a day (BID) | ORAL | Status: DC

## 2015-06-07 MED ORDER — AMANTADINE HCL 100 MG PO CAPS
100.0000 mg | ORAL_CAPSULE | Freq: Two times a day (BID) | ORAL | Status: DC
  Administered 2015-06-07: 100 mg via ORAL
  Filled 2015-06-07 (×2): qty 1

## 2015-06-07 MED ORDER — ARIPIPRAZOLE 5 MG PO TABS
5.0000 mg | ORAL_TABLET | Freq: Two times a day (BID) | ORAL | Status: DC
  Administered 2015-06-07: 5 mg via ORAL
  Filled 2015-06-07 (×3): qty 1

## 2015-06-07 MED ORDER — AMANTADINE HCL 100 MG PO CAPS: 100.0000 mg | ORAL_CAPSULE | Freq: Two times a day (BID) | ORAL | Status: DC

## 2015-06-07 NOTE — BH Assessment (Signed)
[facility] Assessment Progress Note  Per Thedore MinsMojeed Akintayo, MD, this pt does not require psychiatric hospitalization at this time.  [facility]  Pt also request information about area homeless shelters.  Discharge instructions advise pt to [facility], to address his mental health needs, and with [facility] to address his substance abuse treatment needs.  This Clinical research associatewriter spoke to Loews CorporationBrittney Whitaker, DucorLCSW, who agrees to speak to the pt about area homeless shelters.  Pt's nurse, Victorino DikeJennifer, has been notified.  Doylene Canninghomas Kenitha Glendinning, MA Triage Specialist [phone]

## 2015-06-07 NOTE — ED Notes (Signed)
Patient reports that he is doing better this am and is requesting discharge. Patient reports the voices are better and denies any SI at this time. Requesting coffee and TV turned on. Cooperative at this time.

## 2015-06-07 NOTE — Consult Note (Signed)
[facility] Face-to-Face Psychiatry Consult   Reason for Consult:  Suicide ideation, Crack Cocaine use disorder. Referring Physician:  EDP Patient Identification: [name] MRN:  [id number] Principal Diagnosis: Cocaine use disorder, severe, dependence ([place]) Diagnosis:   Patient Active Problem List   Diagnosis Date Noted  . Substance or medication-induced bipolar and related disorder with onset during intoxication [facility]) [F19.94] 08/10/2014    Priority: High  . Cocaine use disorder, severe, dependence ([place]) [F14.20]     Priority: High  . Substance induced mood disorder ([place]) [F19.94] 03/13/2015  . Acute kidney failure ([place]) [N17.9] 01/26/2015  . Schizophrenia, paranoid type ([place]) [F20.0] 01/17/2015  . Suicidal ideation [R45.851]   . Drug hallucinosis ([place]) [F19.951] 10/08/2014  . Chronic paranoid schizophrenia ([place]) [F20.0] 09/07/2014  . Acute CHF (congestive heart failure) ([place]) [I50.9] 07/29/2014  . Essential hypertension, benign [I10] 03/28/2013  . Diabetes mellitus ([place]) [E11.9] 03/15/2013    Total Time spent with patient: 45 minutes  Subjective:   [name] is a 54 y.o. [name] patient admitted with suicide ideation, Crack Cocaine use.  HPI:  [name], 54 years old was evaluated for suicidal ideation.  This is one of the multiple visits to our ER for same complaint.  Patient reported that he was feeling suicidal with plans to walk into traffic yesterday.  He states that he now feels better after getting some good sleep.  Patient reports that housing is an issue for him and that he does not like staying with his cousin because they argue a lot.  Patient reports that he feels suicidal after using his disability money to smoke "Crack Cocaine"   Patient reports his mood is better this morning and his affect is congruent.  He denies SI/HI/AVH.  Patient admits to a diagnosis of Paranoid Schizophrenia.  Patient is discharged now but will see our SW regarding homeless  Shelters.  Past Psychiatric History:  Chronic Paranoid Schizophrenia  Risk to Self: Suicidal Ideation: Yes-Currently Present Suicidal Intent: Yes-Currently Present (Pt says he cannot contract for safety at this time.) Is patient at risk for suicide?: Yes Suicidal Plan?: Yes-Currently Present Specify Current Suicidal Plan: Step into traffic and get hit. Access to Means: Yes Specify Access to Suicidal Means: Traffic What has been your use of drugs/alcohol within the last 12 months?: Crack, ETOH, marijuana How many times?:  (Multiple attempts) Other Self Harm Risks: None noted Triggers for Past Attempts: None known, Unpredictable Intentional Self Injurious Behavior: None Comment - Self Injurious Behavior: None reported Risk to Others: Homicidal Ideation: No Thoughts of Harm to Others: No-Not Currently Present/Within Last 6 Months Comment - Thoughts of Harm to Others: None at this time. Current Homicidal Intent: No Current Homicidal Plan: No Describe Current Homicidal Plan: None reported Access to Homicidal Means: No Describe Access to Homicidal Means: None Identified Victim: No one History of harm to others?: No Assessment of Violence: None Noted Violent Behavior Description: None reported Does patient have access to weapons?: No Criminal Charges Pending?: No Describe Pending Criminal Charges: Unknown Does patient have a court date: No Court Date:  (Unknown ) Prior Inpatient Therapy: Prior Inpatient Therapy: Yes Prior Therapy Dates: multiple Prior Therapy Facilty/Provider(s): various Reason for Treatment: schizophrenia, SI/HI Prior Outpatient Therapy: Prior Outpatient Therapy: Yes Prior Therapy Dates: sporatically Prior Therapy Facilty/Provider(s): [name] Reason for Treatment: med mgmt Does patient have an ACCT team?: Unknown Does patient have Intensive In-House Services?  : No Does patient have [name] services? : Yes Does patient have P4CC services?: No  Past Medical  History:  Past Medical History  Diagnosis Date  . Diabetes mellitus without complication ([place])   . Hypertension   . Schizophrenia ([place])   . CHF (congestive heart failure) ([place])   . Neuropathy ([place])   . Polysubstance abuse   . Cocaine abuse   . Homelessness   . Hepatitis C   . Chronic foot pain   . Homelessness    History reviewed. No pertinent past surgical history. Family History:  Family History  Problem Relation Age of Onset  . Hypertension Other   . Diabetes Other    Family Psychiatric  History:  Unknown Social History:  History  Alcohol Use  . Yes    Comment: Daily Drinker      History  Drug Use  . 7.00 per week  . Special: "Crack" cocaine, Cocaine, Marijuana    Comment: Cocaine tonight, Marijuana "a long time"    Social History   Social History  . Marital Status: Divorced    Spouse Name: N/A  . Number of Children: N/A  . Years of Education: N/A   Social History Main Topics  . Smoking status: Current Every Day Smoker -- 1.00 packs/day for 20 years    Types: Cigarettes  . Smokeless tobacco: Current User  . Alcohol Use: Yes     Comment: Daily Drinker   . Drug Use: 7.00 per week    Special: "Crack" cocaine, Cocaine, Marijuana     Comment: Cocaine tonight, Marijuana "a long time"  . Sexual Activity: No   Other Topics Concern  . None   Social History Narrative   ** Merged History Encounter **       Additional Social History:    Pain Medications: See PTA medications Prescriptions: See PTA medications Over the Counter: See PTA medications History of alcohol / drug use?: Yes Longest period of sobriety (when/how long): Unknown Name of Substance 1: Crack cocaine 1 - Age of First Use: 30's 1 - Amount (size/oz): Half a gram to a whole gram 1 - Frequency: Daily 1 - Duration: On-going 1 - Last Use / Amount: 06/06/15 Name of Substance 2: Marijuana 2 - Age of First Use: Teens 2 - Amount (size/oz): one joint at a time 2 - Frequency: unknown 2 -  Duration: On going 2 - Last Use / Amount: Unknown, UDS not done yet. Name of Substance 3: ETOH 3 - Age of First Use: Teens 3 - Amount (size/oz): Varies 3 - Frequency: Often, 4-5 times in a month 3 - Duration: On-going 3 - Last Use / Amount: Unknown               Allergies:   Allergies  Allergen Reactions  . Haldol [Haloperidol] Other (See Comments)    Muscle spasms, loss of voluntary movement. However, pt has taken Thorazine on multiple occasions with no adverse effects.     Labs:  Results for orders placed or performed during the hospital encounter of 06/06/15 (from the past 48 hour(s))  Comprehensive metabolic panel     Status: Abnormal   Collection Time: 06/06/15  5:01 PM  Result Value Ref Range   Sodium 143 135 - 145 mmol/L   Potassium 3.4 (L) 3.5 - 5.1 mmol/L   Chloride 107 101 - 111 mmol/L   CO2 30 22 - 32 mmol/L   Glucose, Bld 135 (H) 65 - 99 mg/dL   BUN 16 6 - 20 mg/dL   Creatinine, Ser 0.75 0.61 - 1.24 mg/dL   Calcium 8.7 (  L) 8.9 - 10.3 mg/dL   Total Protein 6.4 (L) 6.5 - 8.1 g/dL   Albumin 3.4 (L) 3.5 - 5.0 g/dL   AST 20 15 - 41 U/L   ALT 17 17 - 63 U/L   Alkaline Phosphatase 83 38 - 126 U/L   Total Bilirubin 0.6 0.3 - 1.2 mg/dL   GFR calc non Af Amer >60 >60 mL/min   GFR calc Af Amer >60 >60 mL/min    Comment: (NOTE) The eGFR has been calculated using the CKD EPI equation. This calculation has not been validated in all clinical situations. eGFR's persistently <60 mL/min signify possible Chronic Kidney Disease.    Anion gap 6 5 - 15  CBC     Status: Abnormal   Collection Time: 06/06/15  5:01 PM  Result Value Ref Range   WBC 8.9 4.0 - 10.5 K/uL   RBC 3.87 (L) 4.22 - 5.81 MIL/uL   Hemoglobin 11.3 (L) 13.0 - 17.0 g/dL   HCT 35.8 (L) 39.0 - 52.0 %   MCV 92.5 78.0 - 100.0 fL   MCH 29.2 26.0 - 34.0 pg   MCHC 31.6 30.0 - 36.0 g/dL   RDW 14.6 11.5 - 15.5 %   Platelets 203 150 - 400 K/uL  Ethanol (ETOH)     Status: None   Collection Time: 06/06/15  5:02  PM  Result Value Ref Range   Alcohol, Ethyl (B) <5 <5 mg/dL    Comment:        LOWEST DETECTABLE LIMIT FOR SERUM ALCOHOL IS 5 mg/dL FOR MEDICAL PURPOSES ONLY   Salicylate level     Status: None   Collection Time: 06/06/15  5:02 PM  Result Value Ref Range   Salicylate Lvl <3.5 2.8 - 30.0 mg/dL  Acetaminophen level     Status: Abnormal   Collection Time: 06/06/15  5:02 PM  Result Value Ref Range   Acetaminophen (Tylenol), Serum <10 (L) 10 - 30 ug/mL    Comment:        THERAPEUTIC CONCENTRATIONS VARY SIGNIFICANTLY. A RANGE OF 10-30 ug/mL MAY BE AN EFFECTIVE CONCENTRATION FOR MANY PATIENTS. HOWEVER, SOME ARE BEST TREATED AT CONCENTRATIONS OUTSIDE THIS RANGE. ACETAMINOPHEN CONCENTRATIONS >150 ug/mL AT 4 HOURS AFTER INGESTION AND >50 ug/mL AT 12 HOURS AFTER INGESTION ARE OFTEN ASSOCIATED WITH TOXIC REACTIONS.   Urine rapid drug screen (hosp performed) (Not at [facility])     Status: Abnormal   Collection Time: 06/07/15  7:00 AM  Result Value Ref Range   Opiates NONE DETECTED NONE DETECTED   Cocaine POSITIVE (A) NONE DETECTED   Benzodiazepines NONE DETECTED NONE DETECTED   Amphetamines NONE DETECTED NONE DETECTED   Tetrahydrocannabinol NONE DETECTED NONE DETECTED   Barbiturates NONE DETECTED NONE DETECTED    Comment:        DRUG SCREEN FOR MEDICAL PURPOSES ONLY.  IF CONFIRMATION IS NEEDED FOR ANY PURPOSE, NOTIFY LAB WITHIN 5 DAYS.        LOWEST DETECTABLE LIMITS FOR URINE DRUG SCREEN Drug Class       Cutoff (ng/mL) Amphetamine      1000 Barbiturate      200 Benzodiazepine   573 Tricyclics       220 Opiates          300 Cocaine          300 THC              50     Current Facility-Administered Medications  Medication Dose Route Frequency Provider Last  Rate Last Dose  . amantadine (SYMMETREL) capsule 100 mg  100 mg Oral BID Jibran Crookshanks      . ARIPiprazole (ABILIFY) tablet 5 mg  5 mg Oral BID PC Deni Berti       Current Outpatient Prescriptions  Medication  Sig Dispense Refill  . amantadine (SYMMETREL) 100 MG capsule Take 1 capsule (100 mg total) by mouth 2 (two) times daily. (Patient not taking: Reported on 05/09/2015) 60 capsule 0  . amantadine (SYMMETREL) 100 MG capsule Take 1 capsule (100 mg total) by mouth 2 (two) times daily. 60 capsule 0  . ARIPiprazole (ABILIFY) 5 MG tablet Take 1 tablet (5 mg total) by mouth 2 (two) times daily after a meal. (Patient not taking: Reported on 05/09/2015) 60 tablet 0  . ARIPiprazole (ABILIFY) 5 MG tablet Take 1 tablet (5 mg total) by mouth 2 (two) times daily after a meal. 60 tablet 0    Musculoskeletal: Strength & Muscle Tone: within normal limits Gait & Station: normal Patient leans: N/A  Psychiatric Specialty Exam: ROS Negative except for documented PMH.  Blood pressure 182/91, pulse 91, temperature 98.8 F (37.1 C), temperature source Oral, resp. rate 18, SpO2 95 %.There is no weight on file to calculate BMI.  General Appearance: Casual  Eye Contact::  Good  Speech:  Clear and Coherent and Normal Rate  Volume:  Normal  Mood:  Euthymic  Affect:  Congruent  Thought Process:  Coherent, Goal Directed and Intact  Orientation:  Full (Time, Place, and Person)  Thought Content:  WDL  Suicidal Thoughts:  No  Homicidal Thoughts:  No  Memory:  Immediate;   Good Recent;   Good Remote;   Good  Judgement:  Fair  Insight:  Good  Psychomotor Activity:  Normal  Concentration:  Fair  Recall:  [place] of Knowledge:Fair  Language: Good  Akathisia:  No  Handed:  Right  AIMS (if indicated):     Assets:  Desire for Improvement Housing Social Support  ADL's:  Intact  Cognition: WNL  Sleep:      Disposition:  Discharge home, Follow up with [facility]   PMHNP-BC 06/07/2015 11:39 AM Patient seen face-to-face for psychiatric evaluation, chart reviewed and case discussed with the physician extender and developed treatment plan. Reviewed the information documented and agree  with the treatment plan. Corena Pilgrim, MD

## 2015-06-07 NOTE — Discharge Instructions (Addendum)
For your ongoing mental health needs, you are advised to follow up with [name].  If you do not currently have an appointment scheduled, new and returning patients are seen at their walk-in clinic.  Walk-in hours are Monday - Friday from 8:00 am - 3:00 pm.  Walk-in patients are seen on a first come, first served basis.  Try to arrive as early as possible for he best chance of being seen the same day:       [name]      [address]      [place], [place] [address]      [phone]   To help you maintain a sober lifestyle, a substance abuse treatment program may be beneficial to you.  Contact [facility] at your earliest opportunity to enroll in their program:       [facility]      [address]      [place], [place] [address]      [phone]      New patients are seen at the walk-in clinic every Tuesday from 9:00 am - 12:00 pm.

## 2015-06-07 NOTE — [facility] Suicide Risk Assessment (Cosign Needed)
Suicide Risk Assessment  Dis[facility]   [facility]   Demographic Factors:  Male, Low socioeconomic status and Unemployed  Total Time spent with patient: 20 minutes  Musculoskeletal: Strength & Muscle Tone: within normal limits Gait & Station: normal Patient leans: N/A  Psychiatric Specialty Exam:     Blood pressure 182/91, pulse 91, temperature 98.8 F (37.1 C), temperature source Oral, resp. rate 18, SpO2 95 %.There is no weight on file to calculate BMI.  General Appearance: Casual  Eye Contact:: Good  Speech: Clear and Coherent and Normal Rate  Volume: Normal  Mood: Euthymic  Affect: Congruent  Thought Process: Coherent, Goal Directed and Intact  Orientation: Full (Time, Place, and Person)  Thought Content: WDL  Suicidal Thoughts: No  Homicidal Thoughts: No  Memory: Immediate; Good Recent; Good Remote; Good  Judgement: Fair  Insight: Good  Psychomotor Activity: Normal  Concentration: Fair  Recall: Fair  Fund of Knowledge:Fair  Language: Good  Akathisia: No  Handed: Right  AIMS (if indicated):    Assets: Desire for Improvement Housing Social Support  ADL's: Intact  Cognition: WNL            Has this patient used any form of tobacco in the last 30 days? (Cigarettes, Smokeless Tobacco, Cigars, and/or Pipes) Yes, A prescription for an FDA-approved tobacco cessation medication was offered at discharge and the patient refused  Mental Status Per Nursing Assessment::   On Admission:     Current Mental Status by Physician: NA  Loss Factors: NA  Historical Factors: NA  Risk Reduction Factors:   Living with another person, especially a relative  Continued Clinical Symptoms:  Schizophrenia:   Paranoid or undifferentiated type Previous Psychiatric Diagnoses and Treatments  Cognitive Features That Contribute To Risk:  Polarized thinking    Suicide Risk:  Minimal:  No identifiable suicidal ideation.  Patients presenting with no risk factors but with morbid ruminations; may be classified as minimal risk based on the severity of the depressive symptoms  Principal Problem: Cocaine use disorder, severe, dependence [facility](HCC) Discharge Diagnoses:  Patient Active Problem List   Diagnosis Date Noted  . Substance or medication-induced bipolar and related disorder with onset during intoxication [facility](HCC) [F19.94] 08/10/2014    Priority: High  . Cocaine use disorder, severe, dependence (HCC) [F14.20]     Priority: High  . Suicidal ideations [R45.851]   . Substance induced mood disorder (HCC) [F19.94] 03/13/2015  . Acute kidney failure (HCC) [N17.9] 01/26/2015  . Schizophrenia, paranoid type (HCC) [F20.0] 01/17/2015  . Suicidal ideation [R45.851]   . 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 Acute CHF (congestive heart failure) (HCC) [I50.9] 07/29/2014  . Essential hypertension, benign [I10] 03/28/2013  . Diabetes mellitus (HCC) [E11.9] 03/15/2013    Follow-up Information    Follow up with Please use the list of medicare patients to find a doctor for follow up. Schedule an appointment as soon as possible for a visit on 06/09/2015.      Plan Of Care/Follow-up recommendations:  Activity:  As tolerated Diet:  regular  Is patient on multiple antipsychotic therapies at discharge:  No   Has Patient had three or more failed trials of antipsychotic monotherapy by history:  No  Recommended Plan for Multiple Antipsychotic Therapies: NA    Earney NavyJosephine C Rebbecca Osuna   PMHNP-BC 06/07/2015, 11:58 AM

## 2015-06-27 IMAGING — CR DG CHEST 2V
2 series · 2 of 2 positions shown · non-contrast
Comparison: 10/06/2014

CLINICAL DATA: Mid chest pain.

EXAM:
CHEST  2 VIEW

[chest pa]
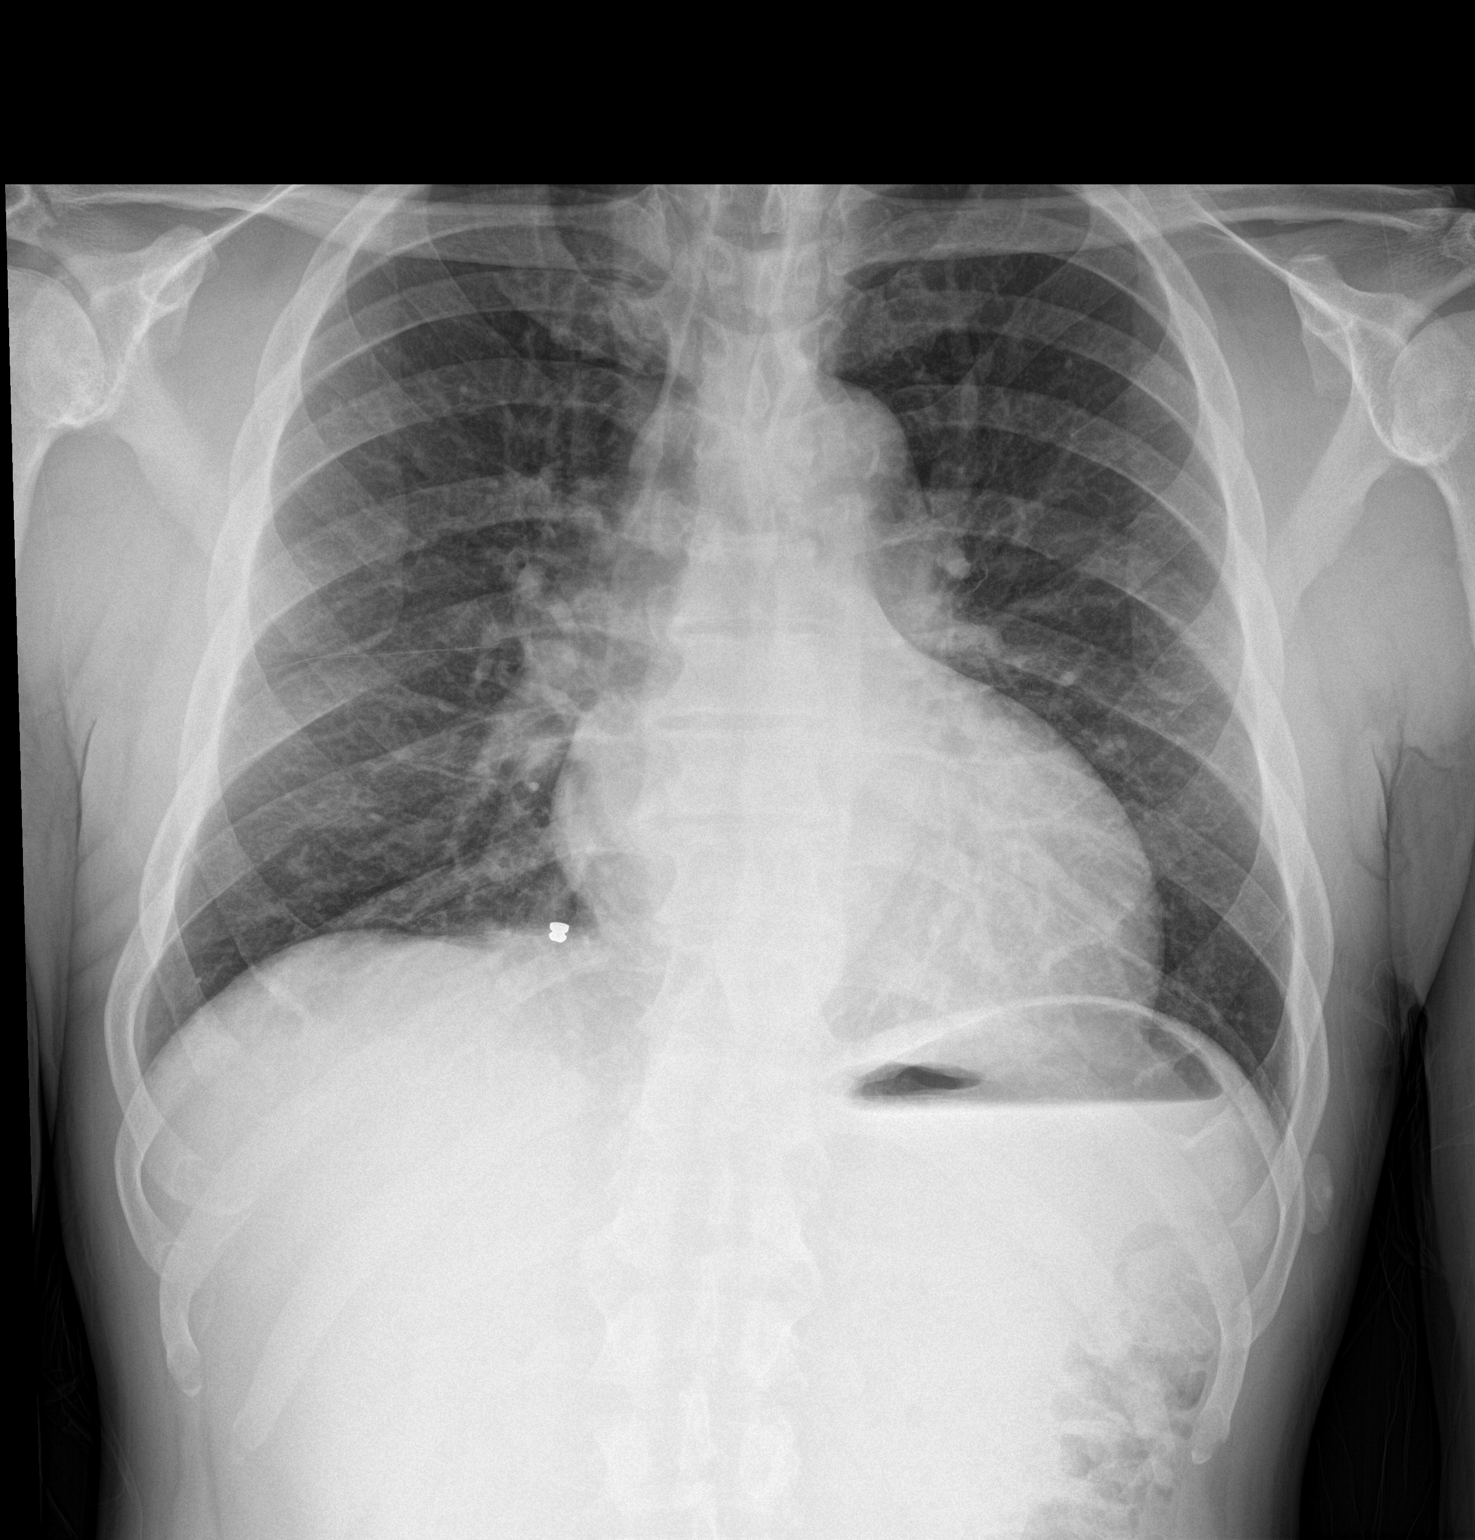

[chest lat]
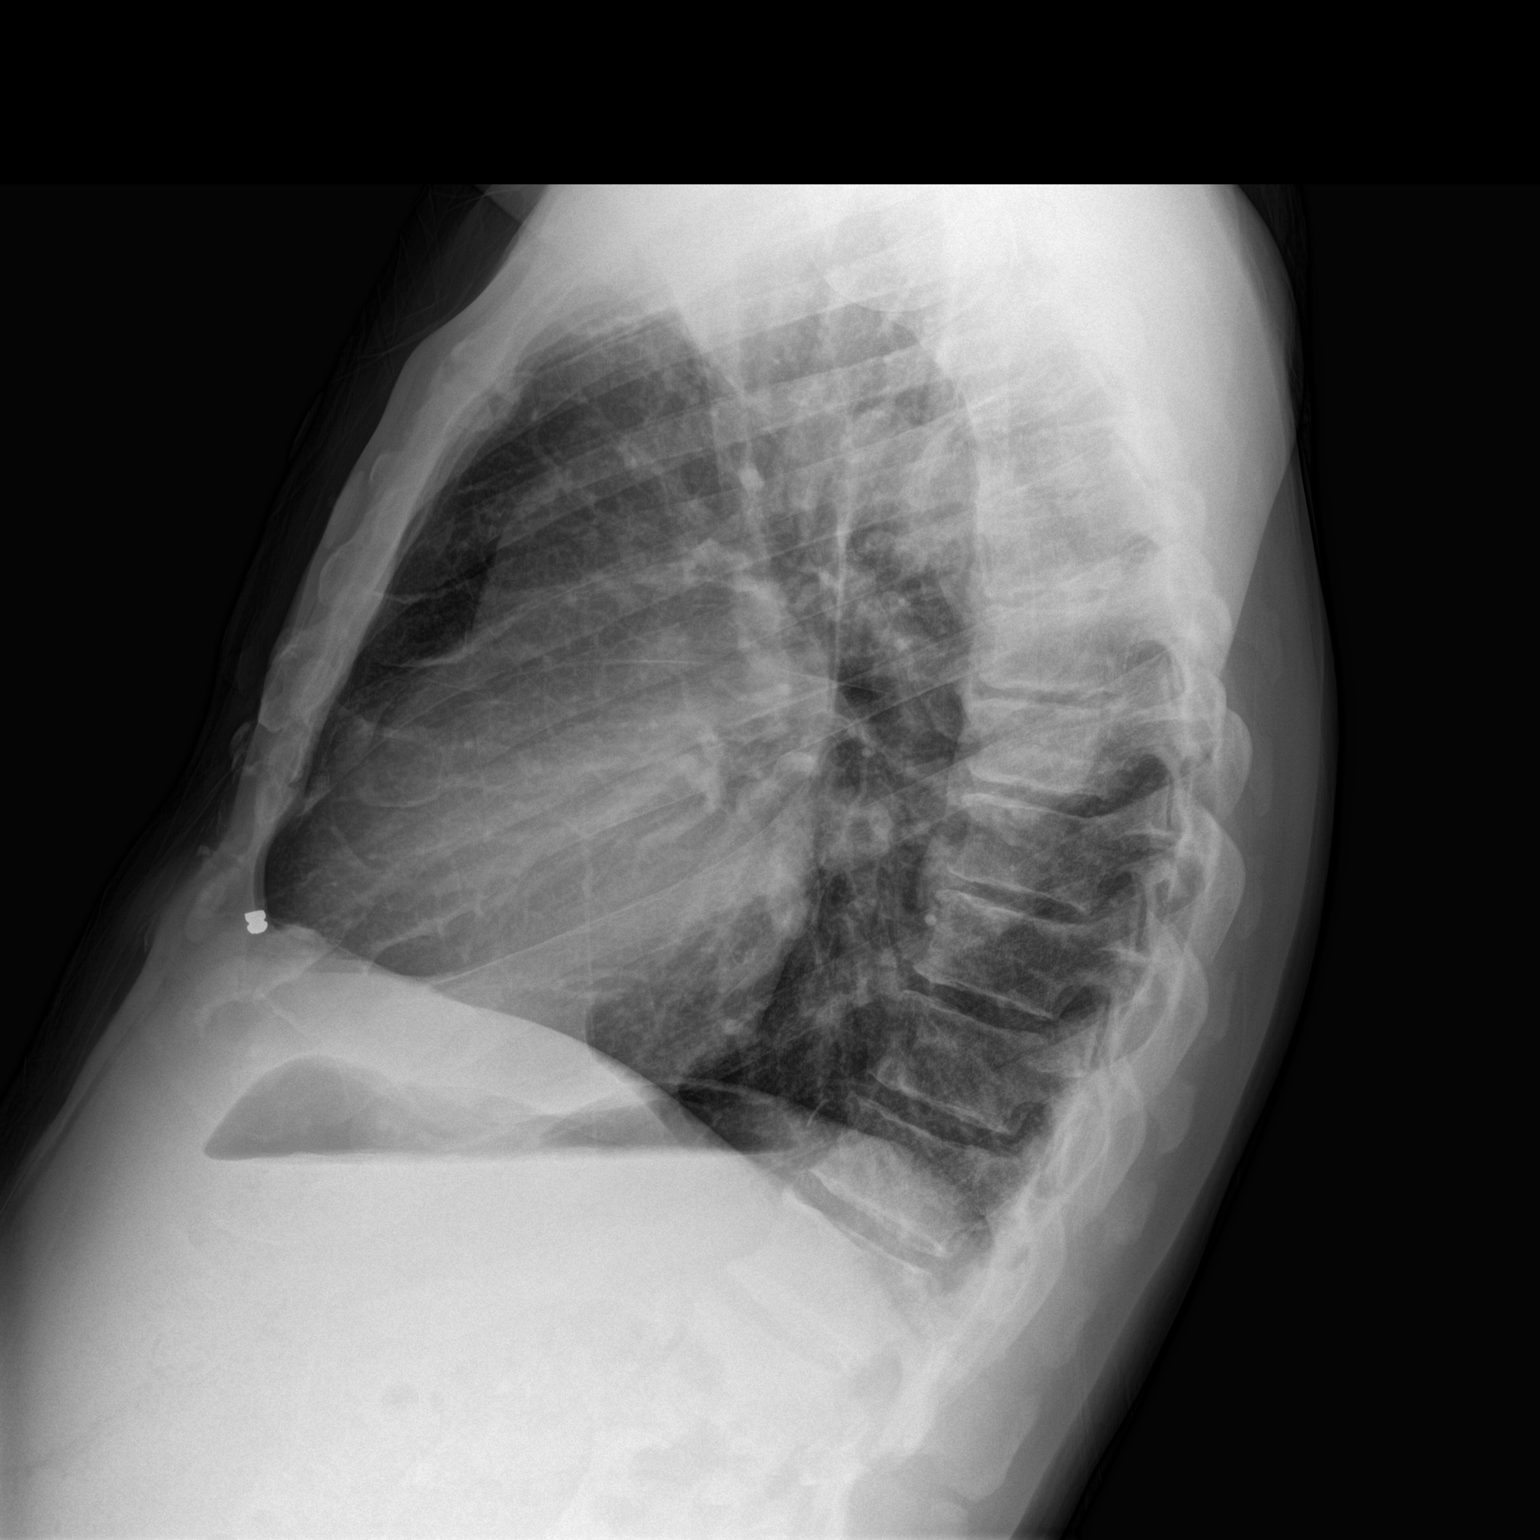

[2 of 2 positions shown; findings below may reference images not displayed]

FINDINGS: Normal heart size and pulmonary vascularity. No focal airspace
disease or consolidation in the lungs. No blunting of costophrenic
angles. No pneumothorax. Mediastinal contours appear intact.
Calcified and tortuous aorta. Metallic Pallet projected over the
right lower chest anteriorly. Degenerative changes in the spine.
IMPRESSION: No active cardiopulmonary disease.

## 2015-06-27 IMAGING — CR DG FOOT COMPLETE 3+V*R*
3 series · 3 of 3 positions shown · non-contrast
Comparison: 05/13/2014

CLINICAL DATA: Pain in the bottom of the foot, distal metatarsal
area. No known injury.

EXAM:
RIGHT FOOT COMPLETE - 3+ VIEW

[foot ap]
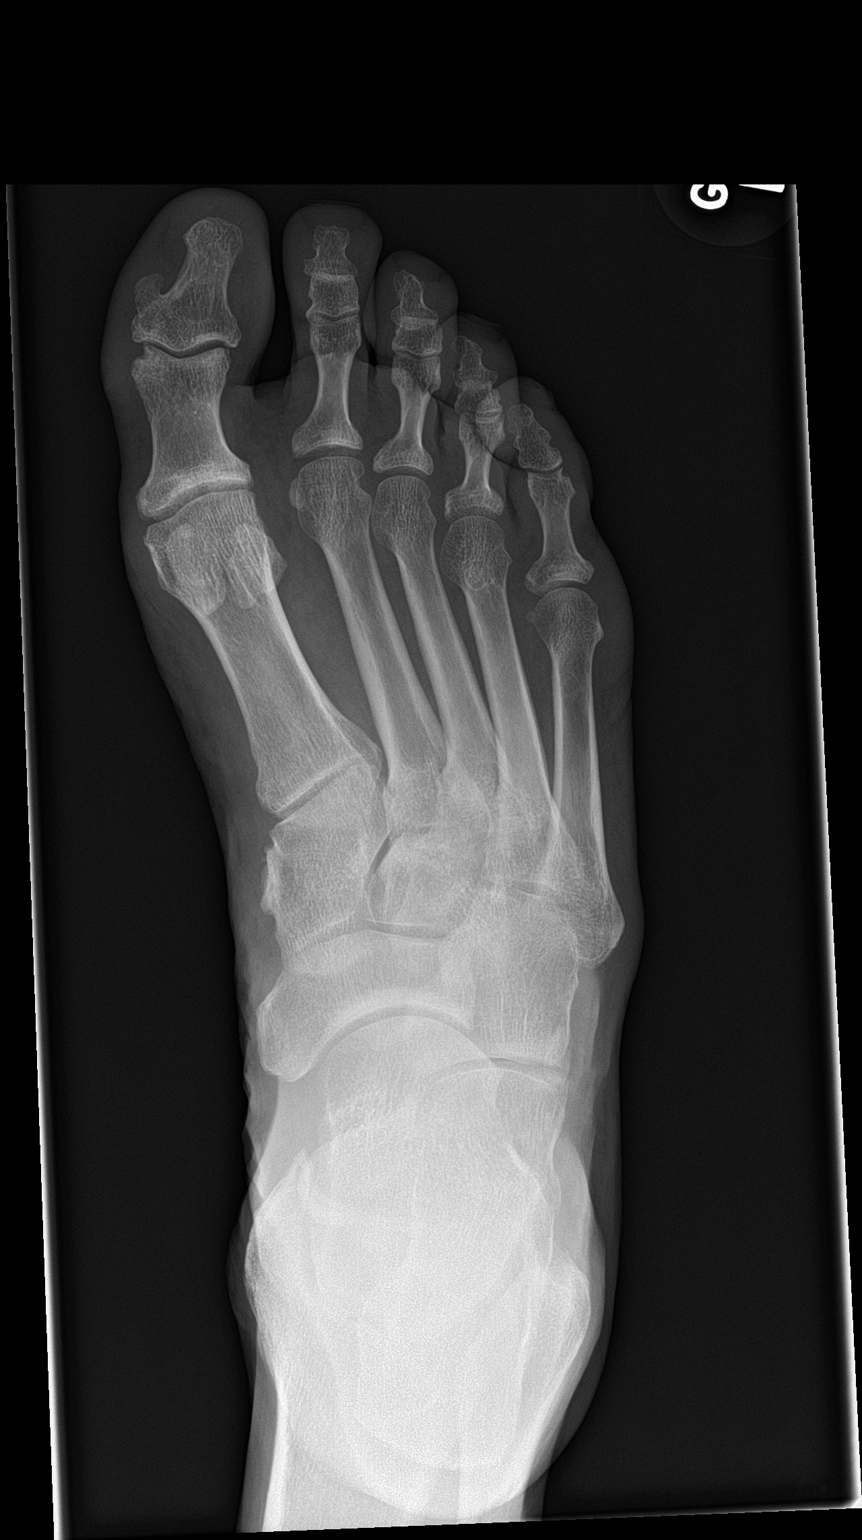

[foot obl]
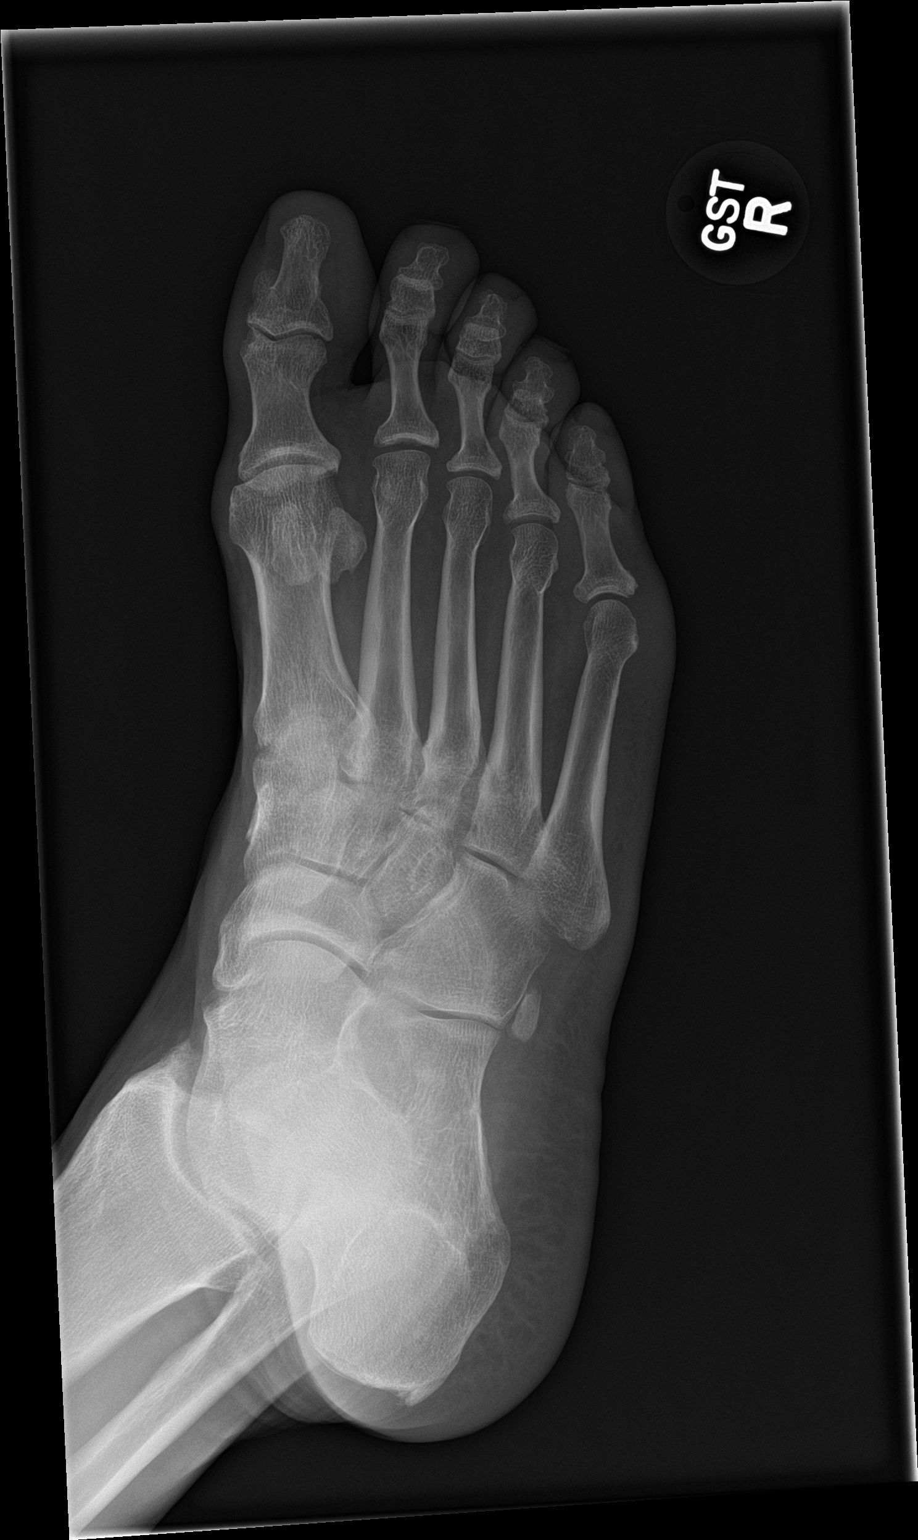

[foot lat]
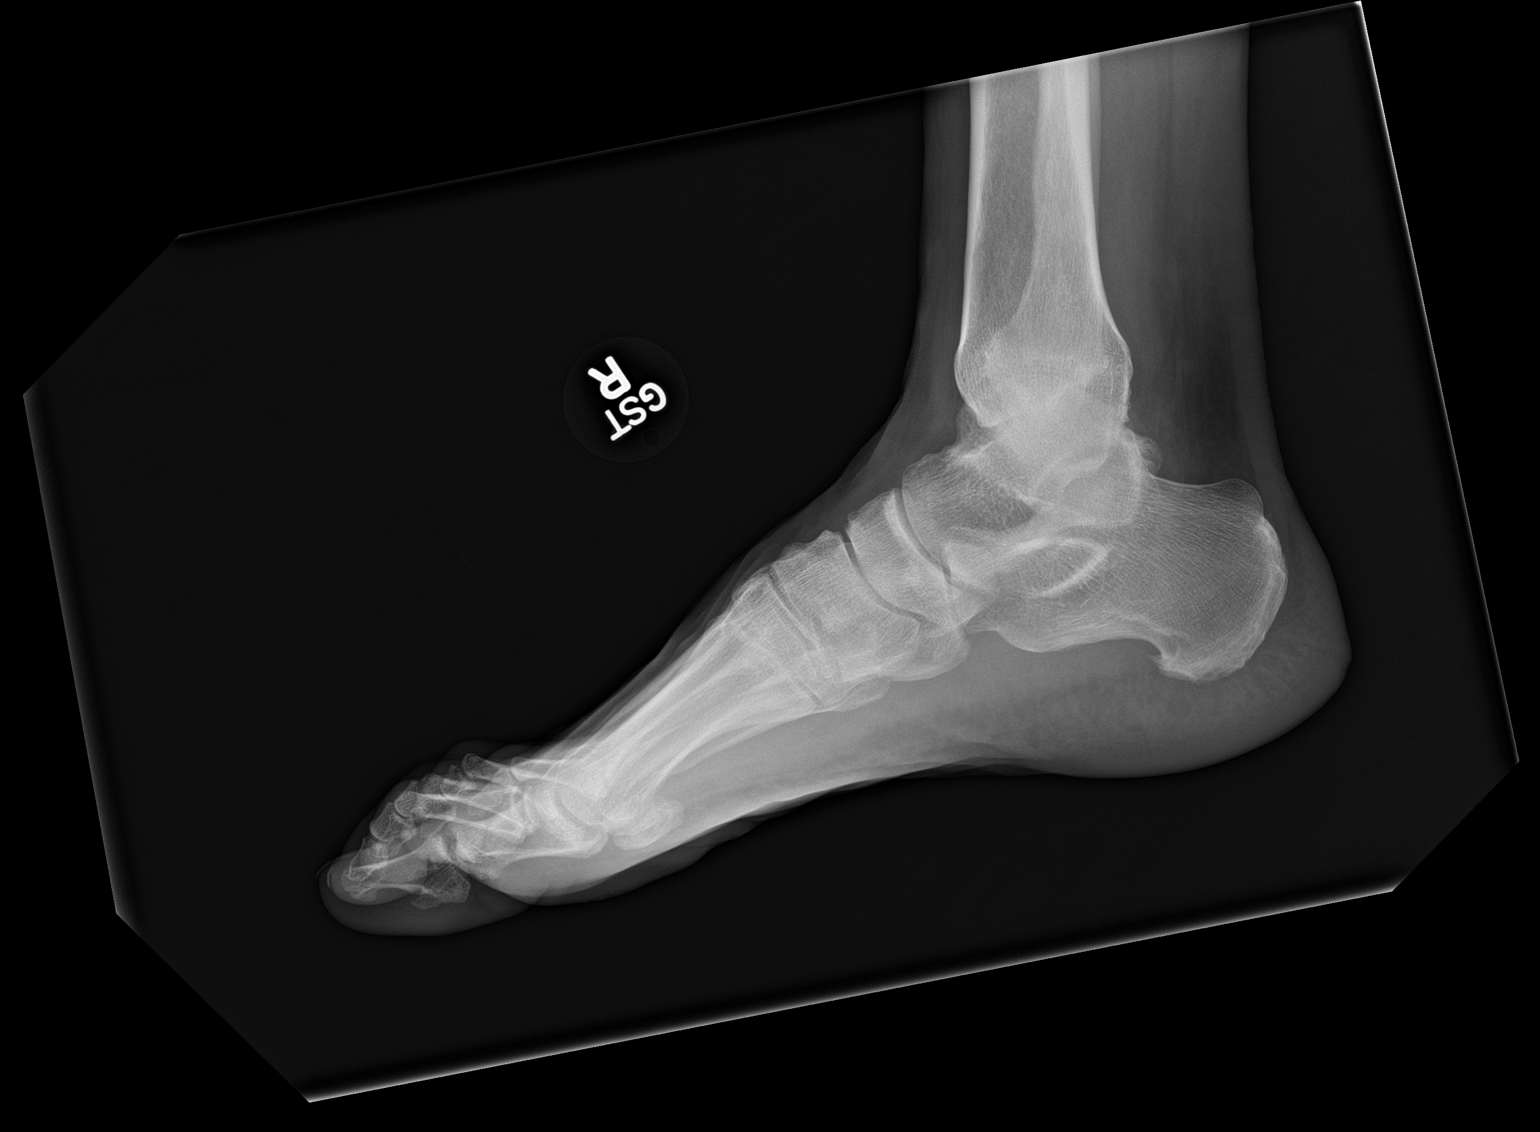

[3 of 3 positions shown; findings below may reference images not displayed]

FINDINGS: Degenerative changes in the first metatarsal-phalangeal joint. Old
ununited ossicle over the cuboidal bone. No evidence of acute
fracture or subluxation. No focal bone lesion or bone destruction.
Bone cortex and trabecular architecture appear intact. No radiopaque
soft tissue foreign bodies.
IMPRESSION: No acute bony abnormalities.  Mild degenerative changes.

## 2015-07-04 IMAGING — CR DG CHEST 1V
1 series · 1 of 1 positions shown · non-contrast
Comparison: 12/15/2014

CLINICAL DATA: Left chest pain

EXAM:
CHEST  1 VIEW

[chest ap]
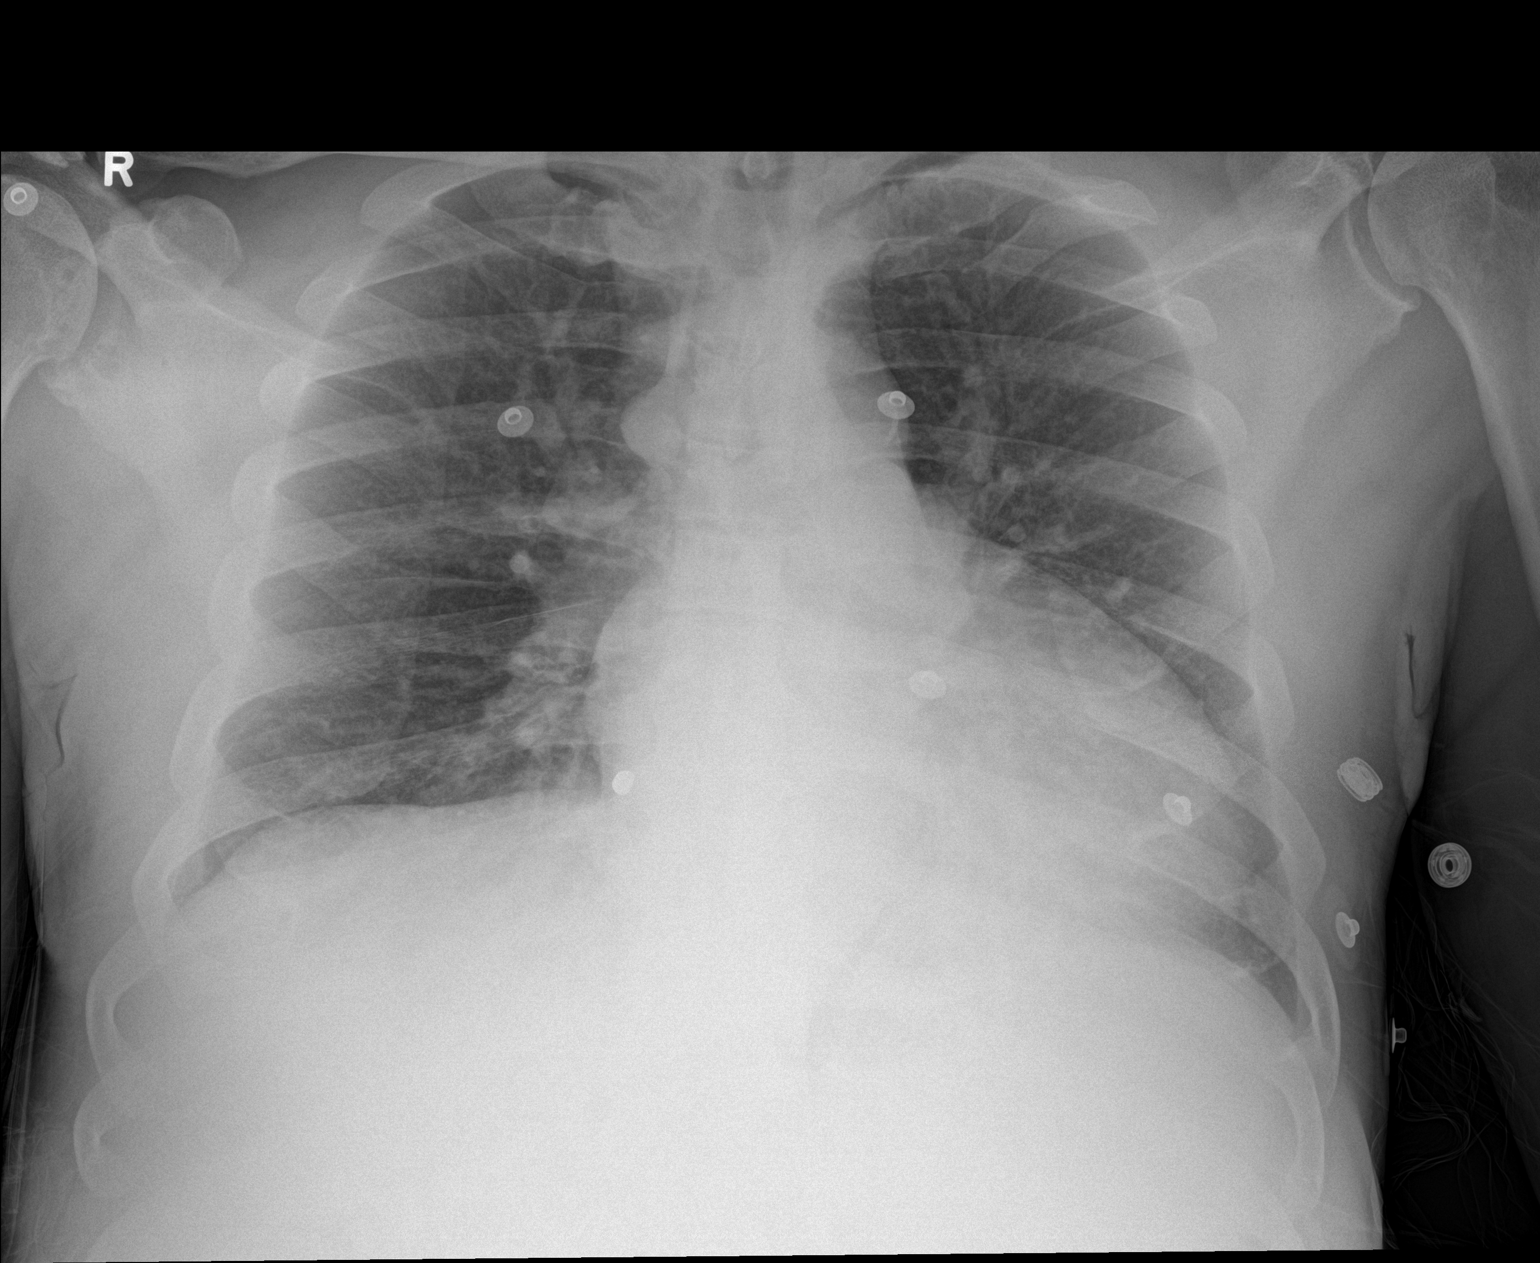

[1 of 1 positions shown; findings below may reference images not displayed]

FINDINGS: Lungs are clear.  No pleural effusion or pneumothorax.

Mild cardiomegaly.

Prominence of the azygos vein.
IMPRESSION: No evidence of acute cardiopulmonary disease.

## 2015-07-08 ENCOUNTER — Emergency Department (HOSPITAL_COMMUNITY)
Admission: EM | Admit: 2015-07-08 | Discharge: 2015-07-09 | Disposition: A | Discharge status: Home / Self Care | Payer: Medicare Other

## 2015-07-08 ENCOUNTER — Emergency Department (HOSPITAL_COMMUNITY)
Admission: EM | Admit: 2015-07-08 | Discharge: 2015-07-08 | Discharge status: Against Medical Advice | Payer: Medicare Other | Attending: Emergency Medicine | Admitting: Emergency Medicine

## 2015-07-08 DIAGNOSIS — F17210 Nicotine dependence, cigarettes, uncomplicated: Secondary | ICD-10-CM | POA: Insufficient documentation

## 2015-07-08 DIAGNOSIS — J029 Acute pharyngitis, unspecified: Secondary | ICD-10-CM | POA: Insufficient documentation

## 2015-07-08 DIAGNOSIS — E119 Type 2 diabetes mellitus without complications: Secondary | ICD-10-CM | POA: Insufficient documentation

## 2015-07-08 DIAGNOSIS — I509 Heart failure, unspecified: Secondary | ICD-10-CM | POA: Insufficient documentation

## 2015-07-08 DIAGNOSIS — Z590 Homelessness: Secondary | ICD-10-CM | POA: Insufficient documentation

## 2015-07-08 DIAGNOSIS — I10 Essential (primary) hypertension: Secondary | ICD-10-CM | POA: Insufficient documentation

## 2015-07-08 DIAGNOSIS — G8929 Other chronic pain: Secondary | ICD-10-CM | POA: Insufficient documentation

## 2015-07-08 NOTE — ED Notes (Signed)
Per EMS-states his throat hurts because he smoked crack

## 2015-07-08 NOTE — ED Notes (Signed)
Pt not in lobby, was later told pt has left to go get food. At this time pt is removed from system.

## 2015-07-15 ENCOUNTER — Emergency Department (HOSPITAL_COMMUNITY): Payer: Medicare Other

## 2015-07-15 ENCOUNTER — Emergency Department (HOSPITAL_COMMUNITY)
Admission: EM | Admit: 2015-07-15 | Discharge: 2015-07-16 | Disposition: A | Discharge status: Home / Self Care | Payer: Medicare Other | Attending: Emergency Medicine | Admitting: Emergency Medicine

## 2015-07-15 ENCOUNTER — Encounter (HOSPITAL_COMMUNITY): Payer: Self-pay | Admitting: Emergency Medicine

## 2015-07-15 DIAGNOSIS — J209 Acute bronchitis, unspecified: Secondary | ICD-10-CM | POA: Insufficient documentation

## 2015-07-15 DIAGNOSIS — F419 Anxiety disorder, unspecified: Secondary | ICD-10-CM | POA: Insufficient documentation

## 2015-07-15 DIAGNOSIS — F17210 Nicotine dependence, cigarettes, uncomplicated: Secondary | ICD-10-CM | POA: Insufficient documentation

## 2015-07-15 DIAGNOSIS — Z8619 Personal history of other infectious and parasitic diseases: Secondary | ICD-10-CM | POA: Insufficient documentation

## 2015-07-15 DIAGNOSIS — R45851 Suicidal ideations: Secondary | ICD-10-CM | POA: Insufficient documentation

## 2015-07-15 DIAGNOSIS — Z9119 Patient's noncompliance with other medical treatment and regimen: Secondary | ICD-10-CM | POA: Insufficient documentation

## 2015-07-15 DIAGNOSIS — F1410 Cocaine abuse, uncomplicated: Secondary | ICD-10-CM | POA: Insufficient documentation

## 2015-07-15 DIAGNOSIS — Z590 Homelessness: Secondary | ICD-10-CM | POA: Insufficient documentation

## 2015-07-15 DIAGNOSIS — I10 Essential (primary) hypertension: Secondary | ICD-10-CM | POA: Insufficient documentation

## 2015-07-15 DIAGNOSIS — G8929 Other chronic pain: Secondary | ICD-10-CM | POA: Insufficient documentation

## 2015-07-15 DIAGNOSIS — I509 Heart failure, unspecified: Secondary | ICD-10-CM | POA: Insufficient documentation

## 2015-07-15 DIAGNOSIS — E119 Type 2 diabetes mellitus without complications: Secondary | ICD-10-CM | POA: Insufficient documentation

## 2015-07-15 DIAGNOSIS — Z79899 Other long term (current) drug therapy: Secondary | ICD-10-CM | POA: Insufficient documentation

## 2015-07-15 DIAGNOSIS — F209 Schizophrenia, unspecified: Secondary | ICD-10-CM | POA: Insufficient documentation

## 2015-07-15 LAB — COMPREHENSIVE METABOLIC PANEL
ALK PHOS: 108 U/L (ref 38–126)
ALT: 53 U/L (ref 17–63)
ANION GAP: 9 (ref 5–15)
AST: 80 U/L — ABNORMAL HIGH (ref 15–41)
Albumin: 3.2 g/dL — ABNORMAL LOW (ref 3.5–5.0)
BILIRUBIN TOTAL: 0.8 mg/dL (ref 0.3–1.2)
BUN: 13 mg/dL (ref 6–20)
CALCIUM: 8.5 mg/dL — AB (ref 8.9–10.3)
CO2: 24 mmol/L (ref 22–32)
Chloride: 108 mmol/L (ref 101–111)
Creatinine, Ser: 0.79 mg/dL (ref 0.61–1.24)
GLUCOSE: 191 mg/dL — AB (ref 65–99)
Potassium: 4.1 mmol/L (ref 3.5–5.1)
Sodium: 141 mmol/L (ref 135–145)
TOTAL PROTEIN: 6.4 g/dL — AB (ref 6.5–8.1)

## 2015-07-15 LAB — CBC
HEMATOCRIT: 31.3 % — AB (ref 39.0–52.0)
Hemoglobin: 9.9 g/dL — ABNORMAL LOW (ref 13.0–17.0)
MCH: 28.8 pg (ref 26.0–34.0)
MCHC: 31.6 g/dL (ref 30.0–36.0)
MCV: 91 fL (ref 78.0–100.0)
Platelets: 248 10*3/uL (ref 150–400)
RBC: 3.44 MIL/uL — ABNORMAL LOW (ref 4.22–5.81)
RDW: 15.3 % (ref 11.5–15.5)
WBC: 7.3 10*3/uL (ref 4.0–10.5)

## 2015-07-15 LAB — RAPID URINE DRUG SCREEN, HOSP PERFORMED
Amphetamines: NOT DETECTED
BARBITURATES: NOT DETECTED
Benzodiazepines: NOT DETECTED
COCAINE: POSITIVE — AB
Opiates: NOT DETECTED
Tetrahydrocannabinol: NOT DETECTED

## 2015-07-15 LAB — ETHANOL: Alcohol, Ethyl (B): <5 mg/dL (ref <5)

## 2015-07-15 LAB — ACETAMINOPHEN LEVEL

## 2015-07-15 LAB — SALICYLATE LEVEL

## 2015-07-15 MED ORDER — ACETAMINOPHEN 500 MG PO TABS
1000.0000 mg | ORAL_TABLET | Freq: Once | ORAL | Status: AC
  Administered 2015-07-16: 1000 mg via ORAL
  Filled 2015-07-15: qty 2

## 2015-07-15 MED ORDER — ARIPIPRAZOLE 5 MG PO TABS
5.0000 mg | ORAL_TABLET | Freq: Two times a day (BID) | ORAL | Status: DC
  Administered 2015-07-16: 5 mg via ORAL
  Filled 2015-07-15 (×2): qty 1

## 2015-07-15 MED ORDER — ALBUTEROL SULFATE HFA 108 (90 BASE) MCG/ACT IN AERS: 2.0000 | INHALATION_SPRAY | Freq: Four times a day (QID) | RESPIRATORY_TRACT | Status: DC | PRN

## 2015-07-15 NOTE — ED Notes (Signed)
Delay on lab draw, pt currently in exray 

## 2015-07-15 NOTE — ED Notes (Signed)
Pt BIB EMS. Pt was at a convenience store when EMS arrived. Pt c/o productive cough and nasal congestion for 5 days. Pt also told EMS he has had suicidal thoughts which started today. Pt cooperative with EMS. Pt alert, no acute distress. Skin warm, dry.

## 2015-07-15 NOTE — ED Notes (Signed)
Bed: [facility] Expected date:  Expected time:  Means of arrival:  Comments: EMS 55 yo male SI and cough

## 2015-07-15 NOTE — ED Provider Notes (Signed)
CSN: [id number]     Arrival date & time 07/15/15  2159 History   First MD Initiated Contact with Patient 07/15/15 2253     Chief Complaint  Patient presents with  . URI  . Medical Clearance     (Consider location/radiation/quality/duration/timing/severity/associated sxs/prior Treatment) Patient is a 55 y.o. male presenting with URI and mental health disorder.  URI Presenting symptoms: congestion, cough and sore throat   Presenting symptoms: no fever   Severity:  Moderate Onset quality:  Gradual Duration:  1 week Timing:  Constant Progression:  Unchanged Chronicity:  New Relieved by:  Nothing Worsened by:  Nothing tried Ineffective treatments:  None tried Mental Health Problem Presenting symptoms: hallucinations (AVH of demons and snakes going into is mouth and "goin into my butthole") and suicidal thoughts   Presenting symptoms: no homicidal ideas   Onset quality:  Gradual Duration:  2 weeks Timing:  Constant Progression:  Unchanged Context: drug abuse (crack cocaine) and noncompliance (1 year off of meds)   Context comment:  Smoking 3 ppd Treatment compliance:  Untreated Relieved by:  Nothing Worsened by:  Nothing tried Ineffective treatments:  None tried   Past Medical History  Diagnosis Date  . Diabetes mellitus without complication (HCC)   . Hypertension   . Schizophrenia (HCC)   . CHF (congestive heart failure) (HCC)   . Neuropathy (HCC)   . Polysubstance abuse   . Cocaine abuse   . Homelessness   . Hepatitis C   . Chronic foot pain   . Homelessness    History reviewed. No pertinent past surgical history. Family History  Problem Relation Age of Onset  . Hypertension Other   . Diabetes Other    Social History  Substance Use Topics  . Smoking status: Current Every Day Smoker -- 1.00 packs/day for 20 years    Types: Cigarettes  . Smokeless tobacco: Current User  . Alcohol Use: Yes     Comment: Daily Drinker     Review of Systems  Constitutional:  Negative for fever.  HENT: Positive for congestion and sore throat.   Respiratory: Positive for cough.   Psychiatric/Behavioral: Positive for suicidal ideas and hallucinations (AVH of demons and snakes going into is mouth and "goin into my butthole"). Negative for homicidal ideas.  All other systems reviewed and are negative.     Allergies  Haldol  Home Medications   Prior to Admission medications   Medication Sig Start Date End Date Taking? Authorizing Provider  amantadine (SYMMETREL) 100 MG capsule Take 1 capsule (100 mg total) by mouth 2 (two) times daily. 06/07/15   Earney Navy, NP  ARIPiprazole (ABILIFY) 5 MG tablet Take 1 tablet (5 mg total) by mouth 2 (two) times daily after a meal. 06/07/15   Earney Navy, NP   BP 157/93 mmHg  Pulse 89  Temp(Src) 99.2 F (37.3 C) (Oral)  Resp 18  SpO2 94% Physical Exam  Constitutional: He is oriented to person, place, and time. He appears well-developed and well-nourished. No distress.  HENT:  Head: Normocephalic and atraumatic.  Mouth/Throat: Oropharynx is clear and moist.  Eyes: Conjunctivae are normal.  Neck: Neck supple. No tracheal deviation present.  Cardiovascular: Normal rate, regular rhythm and normal heart sounds.   Pulmonary/Chest: Effort normal. No respiratory distress. He has wheezes (faint end expiratory). He has no rales.  Abdominal: Soft. He exhibits no distension.  Neurological: He is alert and oriented to person, place, and time.  Skin: Skin is warm and dry.  Psychiatric: His mood appears anxious. His speech is rapid and/or pressured. He is not aggressive and not combative. He expresses suicidal ideation. He expresses suicidal plans (running into traffic).  Vitals reviewed.   ED Course  Procedures (including critical care time) Labs Review Labs Reviewed  COMPREHENSIVE METABOLIC PANEL - Abnormal; Notable for the following:    Glucose, Bld 191 (*)    Calcium 8.5 (*)    Total Protein 6.4 (*)     Albumin 3.2 (*)    AST 80 (*)    All other components within normal limits  ACETAMINOPHEN LEVEL - Abnormal; Notable for the following:    Acetaminophen (Tylenol), Serum <10 (*)    All other components within normal limits  CBC - Abnormal; Notable for the following:    RBC 3.44 (*)    Hemoglobin 9.9 (*)    HCT 31.3 (*)    All other components within normal limits  URINE RAPID DRUG SCREEN, HOSP PERFORMED - Abnormal; Notable for the following:    Cocaine POSITIVE (*)    All other components within normal limits  ETHANOL  SALICYLATE LEVEL    Imaging Review Dg Chest 2 View  07/15/2015  CLINICAL DATA:  Acute onset of productive cough and nasal congestion. Initial encounter. EXAM: CHEST  2 VIEW COMPARISON:  Chest radiograph from 06/03/2015 FINDINGS: The lungs are well-aerated. Vascular congestion is noted. There is no evidence of focal opacification, pleural effusion or pneumothorax. The heart is normal in size; the mediastinal contour is within normal limits. No acute osseous abnormalities are seen. IMPRESSION: Vascular congestion noted.  Lungs remain grossly clear. Electronically Signed   By: Roanna RaiderJeffery  Chang M.D.   On: 07/15/2015 22:36   I have personally reviewed and evaluated these images and lab results as part of my medical decision-making.   EKG Interpretation None      MDM   Final diagnoses:  Acute bronchitis, unspecified organism  Suicidal ideation    55 y.o. male presents with cough and nasal congestion over the last week. He has been smoking 3 packs per day and using crack cocaine and has noticed a hoarse voice and increasing cough. There is no evidence of pneumonia clinically or radiographically today. Patient with signs and symptoms consistent with acute bronchitis likely secondary to a viral illness. He has a secondary complaint of suicidality and increased auditory and visual hallucinations with a plan to run into traffic. TTS was consulted for evaluation. Provided  albuterol for symptoms that are likely secondary to drug abuse and heavy smoking. At this time his symptoms are mild and risk of psychiatric side effects of steroids outweigh benefits. Has been noncompliant with medications. MEDICALLY CLEAR FOR TRANSFER OR PSYCHIATRIC ADMISSION.  Psychiatry team to whom the patient is well-known has seen the patient and feel that he is more likely malingering than actively suicidal. Cleared for discharge by psychiatry.   Lyndal Pulleyaniel Sherell Christoffel, MD 07/16/15 [phone]

## 2015-07-15 NOTE — ED Notes (Addendum)
Patient arrived on the unit and began to demand food and drinks without allowing RN to speak. Pt asking for milk and peanutbutter while at the same time complaining of productive cough. Pt not allowing RN to speak and continued to talk over staff. RN brought drink and crackers to pt's room, but pt then becoming angry and demanding meal. Pt then walking to window demanding meal from second RN and yelling in hallways. Pt then demanding to leave the hospital. MD notified of patient request. Waiting on decision. Patient then cursing and punching window and cursing staff repeatedly.

## 2015-07-15 NOTE — ED Notes (Signed)
[facility] was called per care plan [phone])   Call cousin Candace at [phone] per care plan. Patient is his own guardian.

## 2015-07-15 NOTE — ED Notes (Signed)
Pt currently being evaluated by TTS assessor via telepsych.

## 2015-07-16 NOTE — BH Assessment (Addendum)
Tele Assessment Note   [name] is an 55 y.o. male, single, African-American who presents unaccompanied to [facility] ED after calling EMS from a convenience store and reporting nasal congestion and suicidal ideation. Pt has a history of schizophrenia, substance abuse and multiple visits to the ED. Prior to tele-assessment Pt was angry and demanding to leave due to not being given the meal he wanted. Pt is now cooperative and says wants help. He reports that he is hearing voices and that he is having "hallucinations of snakes coming up through my throat from my ass." He states he is "seeing red demons and white demons." He reports decreased sleep, decreased eating and feeling tired and hopeless. He states that he is depressed and having suicidal ideation with no plan or intent. He states that he has been awake for days out on the streets smoking crack. He says he has run out of money. He denies homicidal ideation, thoughts of harming others or a history of violence. Pt reports he is only using crack at this time, no alcohol or other drugs, and Pt's urine drug screen is positive for cocaine. Pt reports using two grams of crack daily.  Pt reports he is living with his cousin and can return there but has been on the streets. He says he has not taken psychiatric medication regularly in over a year. He is requesting to be given a shot of Thorazine because it was given to him while he was incarcerated and helped him. He denies any current outpatient providers and says he is no longer receiving services from [facility]. Pt reports he was arrested for possession of cocaine and has a court date 07/19/15.  Pt is disheveled, dressed in hospital scrubs, alert, oriented x4 with soft, hoarse speech and normal motor behavior. Eye contact is good. Pt's mood is depressed and anxious; affect is congruent with mood. Thought process is coherent and relevant. Pt insists that he wants help.   Diagnosis:  Schizophrenia; Cocaine Use Disorder, Severe  Past Medical History:  Past Medical History  Diagnosis Date  . Diabetes mellitus without complication (HCC)   . Hypertension   . Schizophrenia (HCC)   . CHF (congestive heart failure) (HCC)   . Neuropathy (HCC)   . Polysubstance abuse   . Cocaine abuse   . Homelessness   . Hepatitis C   . Chronic foot pain   . Homelessness     History reviewed. No pertinent past surgical history.  Family History:  Family History  Problem Relation Age of Onset  . Hypertension Other   . Diabetes Other     Social History:  reports that he has been smoking Cigarettes.  He has a 20 pack-year smoking history. He uses smokeless tobacco. He reports that he drinks alcohol. He reports that he uses illicit drugs ("Crack" cocaine, Cocaine, and Marijuana) about 7 times per week.  Additional Social History:  Alcohol / Drug Use Pain Medications: See PTA medications Prescriptions: See PTA medications Over the Counter: See PTA medications History of alcohol / drug use?: Yes Longest period of sobriety (when/how long): Unknown Negative Consequences of Use: Financial, Legal, Personal relationships, Work / School Substance #1 Name of Substance 1: Crack cocaine 1 - Age of First Use: 30's 1 - Amount (size/oz): 2 grams 1 - Frequency: Daily 1 - Duration: On-going 1 - Last Use / Amount: 07/15/15  CIWA: CIWA-Ar BP: 157/93 mmHg Pulse Rate: 89 COWS:    PATIENT STRENGTHS: (choose at least two)  Average or above average intelligence Capable of independent living Communication skills Physical Health  Allergies:  Allergies  Allergen Reactions  . Haldol [Haloperidol] Other (See Comments)    Muscle spasms, loss of voluntary movement. However, pt has taken Thorazine on multiple occasions with no adverse effects.     Home Medications:  (Not in a hospital admission)  OB/GYN Status:  No LMP for male patient.  General Assessment Data Location of Assessment: [facility]  ED TTS Assessment: In system Is this a Tele or Face-to-Face Assessment?: Tele Assessment Is this an Initial Assessment or a Re-assessment for this encounter?: Initial Assessment Marital status: Single Maiden name: NA Is patient pregnant?: No Pregnancy Status: No Living Arrangements: Other relatives (Staying with cousin) Can pt return to current living arrangement?: Yes Admission Status: Voluntary Is patient capable of signing voluntary admission?: Yes Referral Source: Self/Family/Friend Insurance type: Medicare     Crisis Care Plan Living Arrangements: Other relatives (Staying with cousin) Legal Guardian: Other: (None) [facility] Name of Therapist: None  Education Status Is patient currently in school?: No Current Grade: NA Highest grade of school patient has completed: 12th grade Name of school: NA Contact person: NA  Risk to self with the past 6 months Suicidal Ideation: Yes-Currently Present Has patient been a risk to self within the past 6 months prior to admission? : Yes Suicidal Intent: No Has patient had any suicidal intent within the past 6 months prior to admission? : No Is patient at risk for suicide?: No Suicidal Plan?: No Has patient had any suicidal plan within the past 6 months prior to admission? : Yes Specify Current Suicidal Plan: Past plan of walking into traffic Access to Means: Yes Specify Access to Suicidal Means: Access to traffic What has been your use of drugs/alcohol within the last 12 months?: Cocaine Previous Attempts/Gestures: Yes How many times?: 5 (Multiple attempts) Other Self Harm Risks: None Triggers for Past Attempts: None known, Unpredictable Intentional Self Injurious Behavior: None Comment - Self Injurious Behavior: None Family Suicide History: No Recent stressful life event(s): Financial Problems Persecutory voices/beliefs?: Yes Depression: Yes Depression Symptoms: Despondent, Insomnia, Isolating, Fatigue,  Guilt, Loss of interest in usual pleasures, Feeling worthless/self pity, Feeling angry/irritable Substance abuse history and/or treatment for substance abuse?: Yes Suicide prevention information given to non-admitted patients: Not applicable  Risk to Others within the past 6 months Homicidal Ideation: No Does patient have any lifetime risk of violence toward others beyond the six months prior to admission? : No Thoughts of Harm to Others: No Comment - Thoughts of Harm to Others: None  Current Homicidal Intent: No Current Homicidal Plan: No Describe Current Homicidal Plan: None Access to Homicidal Means: No Describe Access to Homicidal Means: None Identified Victim: None History of harm to others?: No Assessment of Violence: None Noted Violent Behavior Description: Pt denies history of violence Does patient have access to weapons?: No Criminal Charges Pending?: Yes Describe Pending Criminal Charges: Possession of cocaine Does patient have a court date: Yes Court Date: 07/19/15 Is patient on probation?: No  Psychosis Hallucinations: Auditory Delusions: None noted  Mental Status Report Appearance/Hygiene: Disheveled, Poor hygiene, In scrubs Eye Contact: Good Motor Activity: Unremarkable Speech: Other (Comment), Soft (hoarse) Level of Consciousness: Alert Mood: Anxious, Helpless Affect: Anxious Anxiety Level: Minimal Thought Processes: Coherent Judgement: Partial Orientation: Person, Place, Time, Situation, Appropriate for developmental age Obsessive Compulsive Thoughts/Behaviors: None  Cognitive Functioning Concentration: Decreased Memory: Recent Intact, Remote Intact IQ: Average Insight: Poor Impulse Control: Poor Appetite: Good  Weight Loss: 0 Weight Gain: 0 Sleep: Decreased Total Hours of Sleep: 3 Vegetative Symptoms: None  ADLScreening [facility] Assessment Services) Patient's cognitive ability adequate to safely complete daily activities?: Yes Patient able to  express need for assistance with ADLs?: Yes Independently performs ADLs?: Yes (appropriate for developmental age)  Prior Inpatient Therapy Prior Inpatient Therapy: Yes Prior Therapy Dates: multiple Prior Therapy Facilty/Provider(s): various Reason for Treatment: schizophrenia, SI/HI  Prior Outpatient Therapy Prior Outpatient Therapy: Yes Prior Therapy Dates: sporatically Prior Therapy Facilty/Provider(s): [name] Reason for Treatment: med mgmt Does patient have an ACCT team?: No (Pt says [facility] doesn't) Does patient have Intensive In-House Services?  : No Does patient have [name] services? : Yes Does patient have [facility] services?: No  ADL Screening (condition at time of admission) Patient's cognitive ability adequate to safely complete daily activities?: Yes Is the patient deaf or have difficulty hearing?: No Does the patient have difficulty seeing, even when wearing glasses/contacts?: No Does the patient have difficulty concentrating, remembering, or making decisions?: No Patient able to express need for assistance with ADLs?: Yes Does the patient have difficulty dressing or bathing?: No Independently performs ADLs?: Yes (appropriate for developmental age) Does the patient have difficulty walking or climbing stairs?: No Weakness of Legs: None Weakness of Arms/Hands: None  Home Assistive Devices/Equipment Home Assistive Devices/Equipment: None    Abuse/Neglect Assessment (Assessment to be complete while patient is alone) Physical Abuse: Denies Verbal Abuse: Denies Sexual Abuse: Denies Exploitation of patient/patient's resources: Denies Self-Neglect: Denies     Merchant navy officer (For Healthcare) Does patient have an advance directive?: No Would patient like information on creating an advanced directive?: No - patient declined information    Additional Information 1:1 In Past 12 Months?: No CIRT Risk: No Elopement Risk: No Does patient have medical clearance?: Yes      Disposition: Gave clinical report to Maryjean Morn, PA who recommends Pt be evaluated by psychiatry in the morning unless Pt becomes agitated and demands to leave, in which case he recommends Pt be discharged. Notified Dr. Rolland Porter and Marita Snellen, RN of recommendation.  Disposition Initial Assessment Completed for this Encounter: Yes Disposition of Patient: Other dispositions Type of inpatient treatment program: Adult Other disposition(s): Other (Comment)   Pamalee Leyden, [facility], [facility], [facility] Triage Specialist [phone]   Patsy Baltimore, Harlin Rain 07/16/2015 12:43 AM

## 2015-07-16 NOTE — Discharge Instructions (Signed)
Acute Bronchitis  Bronchitis is when the airways that extend from the windpipe into the lungs get red, puffy, and painful (inflamed). Bronchitis often causes thick spit (mucus) to develop. This leads to a cough. A cough is the most common symptom of bronchitis.  In acute bronchitis, the condition usually begins suddenly and goes away over time (usually in 2 weeks). Smoking, allergies, and asthma can make bronchitis worse. Repeated episodes of bronchitis may cause more lung problems.  HOME CARE   Rest.   Drink enough fluids to keep your pee (urine) clear or pale yellow (unless you need to limit fluids as told by your doctor).   Only take over-the-counter or prescription medicines as told by your doctor.   Avoid smoking and secondhand smoke. These can make bronchitis worse. If you are a smoker, think about using nicotine gum or skin patches. Quitting smoking will help your lungs heal faster.   Reduce the chance of getting bronchitis again by:    Washing your hands often.    Avoiding people with cold symptoms.    Trying not to touch your hands to your mouth, nose, or eyes.   Follow up with your doctor as told.  GET HELP IF:  Your symptoms do not improve after 1 week of treatment. Symptoms include:   Cough.   Fever.   Coughing up thick spit.   Body aches.   Chest congestion.   Chills.   Shortness of breath.   Sore throat.  GET HELP RIGHT AWAY IF:    You have an increased fever.   You have chills.   You have severe shortness of breath.   You have bloody thick spit (sputum).   You throw up (vomit) often.   You lose too much body fluid (dehydration).   You have a severe headache.   You faint.  MAKE SURE YOU:    Understand these instructions.   Will watch your condition.   Will get help right away if you are not doing well or get worse.     This information is not intended to replace advice given to you by your health care provider. Make sure you discuss any questions you have with your health care  provider.     Document Released: 12/04/2007 Document Revised: 02/17/2013 Document Reviewed: 12/08/2012  Elsevier Interactive Patient Education 2016 [employer]

## 2015-07-16 NOTE — ED Notes (Signed)
Dr. Clydene PughKnott discharged patient. [employer] to escort pt out through lobby. No s/s of distress noted.

## 2015-07-20 ENCOUNTER — Emergency Department (HOSPITAL_COMMUNITY): Payer: Medicare Other

## 2015-07-20 ENCOUNTER — Emergency Department (HOSPITAL_COMMUNITY)
Admission: EM | Admit: 2015-07-20 | Discharge: 2015-07-20 | Disposition: A | Discharge status: Home / Self Care | Payer: Medicare Other | Attending: Emergency Medicine | Admitting: Emergency Medicine

## 2015-07-20 ENCOUNTER — Encounter (HOSPITAL_COMMUNITY): Payer: Self-pay | Admitting: *Deleted

## 2015-07-20 DIAGNOSIS — G8929 Other chronic pain: Secondary | ICD-10-CM | POA: Insufficient documentation

## 2015-07-20 DIAGNOSIS — I509 Heart failure, unspecified: Secondary | ICD-10-CM | POA: Insufficient documentation

## 2015-07-20 DIAGNOSIS — Z8619 Personal history of other infectious and parasitic diseases: Secondary | ICD-10-CM | POA: Insufficient documentation

## 2015-07-20 DIAGNOSIS — J069 Acute upper respiratory infection, unspecified: Secondary | ICD-10-CM

## 2015-07-20 DIAGNOSIS — Z79899 Other long term (current) drug therapy: Secondary | ICD-10-CM | POA: Insufficient documentation

## 2015-07-20 DIAGNOSIS — Z5900 Homelessness unspecified: Secondary | ICD-10-CM

## 2015-07-20 DIAGNOSIS — I10 Essential (primary) hypertension: Secondary | ICD-10-CM | POA: Insufficient documentation

## 2015-07-20 DIAGNOSIS — F209 Schizophrenia, unspecified: Secondary | ICD-10-CM | POA: Insufficient documentation

## 2015-07-20 DIAGNOSIS — J040 Acute laryngitis: Secondary | ICD-10-CM

## 2015-07-20 DIAGNOSIS — E119 Type 2 diabetes mellitus without complications: Secondary | ICD-10-CM | POA: Insufficient documentation

## 2015-07-20 DIAGNOSIS — J050 Acute obstructive laryngitis [croup]: Secondary | ICD-10-CM | POA: Insufficient documentation

## 2015-07-20 DIAGNOSIS — F17210 Nicotine dependence, cigarettes, uncomplicated: Secondary | ICD-10-CM | POA: Insufficient documentation

## 2015-07-20 DIAGNOSIS — Z950 Presence of cardiac pacemaker: Secondary | ICD-10-CM | POA: Insufficient documentation

## 2015-07-20 DIAGNOSIS — B9789 Other viral agents as the cause of diseases classified elsewhere: Secondary | ICD-10-CM

## 2015-07-20 NOTE — ED Notes (Signed)
Pt back from x-ray.

## 2015-07-20 NOTE — ED Notes (Signed)
Pt was ambulated with a starting POX of 98%, remained above 95% during the walk and ended with 97%.

## 2015-07-20 NOTE — ED Notes (Signed)
Pt to ED by [facility] c/o shortness of breath. Pt reports being seen and diagnosed with bronchitis but left before receiving prescriptions.

## 2015-07-20 NOTE — ED Provider Notes (Signed)
TIME SEEN: 4:30 AM  CHIEF COMPLAINT: laryngitis, cough  HPI: Pt is a 55 y.o. male with history of hypertension, diabetes, substance abuse, homelessness, schizophrenia who presents to the emergency department with several days of dry cough, hoarse voice. No throat pain related difficulty swallowing. No fever. States he is still smoking crack.  ROS: limited due to patient's history of schizophrenia  PAST MEDICAL HISTORY/PAST SURGICAL HISTORY:  Past Medical History  Diagnosis Date  . Diabetes mellitus without complication (HCC)   . Hypertension   . Schizophrenia (HCC)   . CHF (congestive heart failure) (HCC)   . Neuropathy (HCC)   . Polysubstance abuse   . Cocaine abuse   . Homelessness   . Hepatitis C   . Chronic foot pain   . Homelessness     MEDICATIONS:  Prior to Admission medications   Medication Sig Start Date End Date Taking? Authorizing Provider  amantadine (SYMMETREL) 100 MG capsule Take 1 capsule (100 mg total) by mouth 2 (two) times daily. 06/07/15   Earney Navy, NP  ARIPiprazole (ABILIFY) 5 MG tablet Take 1 tablet (5 mg total) by mouth 2 (two) times daily after a meal. 06/07/15   Earney Navy, NP    ALLERGIES:  Allergies  Allergen Reactions  . Haldol [Haloperidol] Other (See Comments)    Muscle spasms, loss of voluntary movement. However, pt has taken Thorazine on multiple occasions with no adverse effects.     SOCIAL HISTORY:  Social History  Substance Use Topics  . Smoking status: Current Every Day Smoker -- 1.00 packs/day for 20 years    Types: Cigarettes  . Smokeless tobacco: Current User  . Alcohol Use: Yes     Comment: Daily Drinker     FAMILY HISTORY: Family History  Problem Relation Age of Onset  . Hypertension Other   . Diabetes Other     EXAM: BP 154/96 mmHg  Pulse 97  Temp(Src) 98.3 F (36.8 C) (Oral)  Resp 18  SpO2 95% CONSTITUTIONAL: Alert and oriented and responds appropriately to questions intermittently but has a very  poor historian given his history of psychiatric illness, no acute distress, afebrile HEAD: Normocephalic EYES: Conjunctivae clear, PERRL ENT: normal nose; no rhinorrhea; moist mucous membranes; pharynx without lesions noted; no tonsillar hypertrophy or exudate, no uvular deviation, no trismus or drooling, slightly hoarse voice, no muffled voice, no stridor, no dental caries or abscess noted, no Ludwig's angina, tongue sits flat in the bottom of the mouth NECK: Supple, no meningismus, no LAD  CARD: RRR; S1 and S2 appreciated; no murmurs, no clicks, no rubs, no gallops RESP: Normal chest excursion without splinting or tachypnea; breath sounds clear and equal bilaterally; no wheezes, no rhonchi, no rales, no hypoxia or respiratory distress, speaking full sentences ABD/GI: Normal bowel sounds; non-distended; soft, non-tender, no rebound, no guarding, no peritoneal signs BACK:  The back appears normal and is non-tender to palpation, there is no CVA tenderness EXT: Normal ROM in all joints; non-tender to palpation; no edema; normal capillary refill; no cyanosis, no calf tenderness or swelling    SKIN: Normal color for age and race; warm NEURO: Moves all extremities equally, sensation to light touch intact diffusely, cranial nerves II through XII intact PSYCH: bizarre affect with no acute psychiatric safety concerns  MEDICAL DECISION MAKING: Patient here with complaints of laryngitis, dry cough. Suspect viral illness. He did have sats of 89% with sleeping flat.  I suspect there is some component of sleep apnea. Sats immediately jumped back  up into the upper 90s on room air at rest when he is awake. He has been able to ambulate and his oxygen does not drop below 95%. EKG shows no new ischemic changes. He is not having chest pain. Chest x-ray shows no infiltrate, edema or pneumothorax. Has recently had blood work that has been stable compared to prior. He has no current psychiatric safety component since. He  was recently evaluated by TTS. I feel he is safe to be discharged. Discussed return precautions. Provided with outpatient follow-up information. He verbalized understanding and discomfort will with this plan.     EKG Interpretation  Date/Time:  Thursday July 20 2015 03:48:25 EST Ventricular Rate:  92 PR Interval:  144 QRS Duration: 79 QT Interval:  320 QTC Calculation: 396 R Axis:   62 Text Interpretation:  Sinus rhythm Anteroseptal infarct, old Abnormal T, consider ischemia, lateral leads Borderline ST elevation, lateral leads No significant change since last tracing Confirmed by Alicea Wente,  DO, Kathe Wirick [phone]) on 07/20/2015 4:33:21 AM        Taylor Maw Cinsere Mizrahi, DO 07/20/15 [id number]

## 2015-07-20 NOTE — Discharge Instructions (Signed)
Laryngitis °Laryngitis is inflammation of your vocal cords. This causes hoarseness, coughing, loss of voice, sore throat, or a dry throat. Your vocal cords are two bands of muscles that are found in your throat. When you speak, these cords come together and vibrate. These vibrations come out through your mouth as sound. When your vocal cords are inflamed, your voice sounds different. °Laryngitis can be temporary (acute) or long-term (chronic). Most cases of acute laryngitis improve with time. Chronic laryngitis is laryngitis that lasts for more than three weeks. °CAUSES °Acute laryngitis may be caused by: °· A viral infection. °· Lots of talking, yelling, or singing. This is also called vocal strain. °· Bacterial infections. °Chronic laryngitis may be caused by: °· Vocal strain. °· Injury to your vocal cords. °· Acid reflux (gastroesophageal reflux disease or GERD). °· Allergies. °· Sinus infection. °· Smoking. °· Alcohol abuse. °· Breathing in chemicals or dust. °· Growths on the vocal cords. °RISK FACTORS °Risk factors for laryngitis include: °· Smoking. °· Alcohol abuse. °· Having allergies. °SIGNS AND SYMPTOMS °Symptoms of laryngitis may include: °· Low, hoarse voice. °· Loss of voice. °· Dry cough. °· Sore throat. °· Stuffy nose. °DIAGNOSIS °Laryngitis may be diagnosed by: °· Physical exam. °· Throat culture. °· Blood test. °· Laryngoscopy. This procedure allows your health care provider to look at your vocal cords with a mirror or viewing tube. °TREATMENT °Treatment for laryngitis depends on what is causing it. Usually, treatment involves resting your voice and using medicines to soothe your throat. However, if your laryngitis is caused by a bacterial infection, you may need to take antibiotic medicine. If your laryngitis is caused by a growth, you may need to have a procedure to remove it. °HOME CARE INSTRUCTIONS °· Drink enough fluid to keep your urine clear or pale yellow. °· Breathe in moist air. Use a  humidifier if you live in a dry climate. °· Take medicines only as directed by your health care provider. °· If you were prescribed an antibiotic medicine, finish it all even if you start to feel better. °· Do not smoke cigarettes or electronic cigarettes. If you need help quitting, ask your health care provider. °· Talk as little as possible. Also avoid whispering, which can cause vocal strain. °· Write instead of talking. Do this until your voice is back to normal. °SEEK MEDICAL CARE IF: °· You have a fever. °· You have increasing pain. °· You have difficulty swallowing. °SEEK IMMEDIATE MEDICAL CARE IF: °· You cough up blood. °· You have trouble breathing. °  °This information is not intended to replace advice given to you by your health care provider. Make sure you discuss any questions you have with your health care provider. °  °Document Released: 06/17/2005 Document Revised: 07/08/2014 Document Reviewed: 11/30/2013 °Elsevier Interactive Patient Education ©2016 [employer] ° °Upper Respiratory Infection, Adult °Most upper respiratory infections (URIs) are a viral infection of the air passages leading to the lungs. A URI affects the nose, throat, and upper air passages. The most common type of URI is nasopharyngitis and is typically referred to as "the common cold." °URIs run their course and usually go away on their own. Most of the time, a URI does not require medical attention, but sometimes a bacterial infection in the upper airways can follow a viral infection. This is called a secondary infection. Sinus and middle ear infections are common types of secondary upper respiratory infections. °Bacterial pneumonia can also complicate a URI. A URI can worsen   asthma and chronic obstructive pulmonary disease (COPD). Sometimes, these complications can require emergency medical care and may be life threatening.  °CAUSES °Almost all URIs are caused by viruses. A virus is a type of germ and can spread from one person  to another.  °RISKS FACTORS °You may be at risk for a URI if:  °· You smoke.   °· You have chronic heart or lung disease. °· You have a weakened defense (immune) system.   °· You are very young or very old.   °· You have nasal allergies or asthma. °· You work in crowded or poorly ventilated areas. °· You work in health care facilities or schools. °SIGNS AND SYMPTOMS  °Symptoms typically develop 2-3 days after you come in contact with a cold virus. Most viral URIs last 7-10 days. However, viral URIs from the influenza virus (flu virus) can last 14-18 days and are typically more severe. Symptoms may include:  °· Runny or stuffy (congested) nose.   °· Sneezing.   °· Cough.   °· Sore throat.   °· Headache.   °· Fatigue.   °· Fever.   °· Loss of appetite.   °· Pain in your forehead, behind your eyes, and over your cheekbones (sinus pain). °· Muscle aches.   °DIAGNOSIS  °Your health care provider may diagnose a URI by: °· Physical exam. °· Tests to check that your symptoms are not due to another condition such as: °¨ Strep throat. °¨ Sinusitis. °¨ Pneumonia. °¨ Asthma. °TREATMENT  °A URI goes away on its own with time. It cannot be cured with medicines, but medicines may be prescribed or recommended to relieve symptoms. Medicines may help: °· Reduce your fever. °· Reduce your cough. °· Relieve nasal congestion. °HOME CARE INSTRUCTIONS  °· Take medicines only as directed by your health care provider.   °· Gargle warm saltwater or take cough drops to comfort your throat as directed by your health care provider. °· Use a warm mist humidifier or inhale steam from a shower to increase air moisture. This may make it easier to breathe. °· Drink enough fluid to keep your urine clear or pale yellow.   °· Eat soups and other clear broths and maintain good nutrition.   °· Rest as needed.   °· Return to work when your temperature has returned to normal or as your health care provider advises. You may need to stay home longer to avoid  infecting others. You can also use a face mask and careful hand washing to prevent spread of the virus. °· Increase the usage of your inhaler if you have asthma.   °· Do not use any tobacco products, including cigarettes, chewing tobacco, or electronic cigarettes. If you need help quitting, ask your health care provider. °PREVENTION  °The best way to protect yourself from getting a cold is to practice good hygiene.  °· Avoid oral or hand contact with people with cold symptoms.   °· Wash your hands often if contact occurs.   °There is no clear evidence that vitamin C, vitamin E, echinacea, or exercise reduces the chance of developing a cold. However, it is always recommended to get plenty of rest, exercise, and practice good nutrition.  °SEEK MEDICAL CARE IF:  °· You are getting worse rather than better.   °· Your symptoms are not controlled by medicine.   °· You have chills. °· You have worsening shortness of breath. °· You have brown or red mucus. °· You have yellow or brown nasal discharge. °· You have pain in your face, especially when you bend forward. °· You have a fever. °·   You have swollen neck glands. °· You have pain while swallowing. °· You have white areas in the back of your throat. °SEEK IMMEDIATE MEDICAL CARE IF:  °· You have severe or persistent: °¨ Headache. °¨ Ear pain. °¨ Sinus pain. °¨ Chest pain. °· You have chronic lung disease and any of the following: °¨ Wheezing. °¨ Prolonged cough. °¨ Coughing up blood. °¨ A change in your usual mucus. °· You have a stiff neck. °· You have changes in your: °¨ Vision. °¨ Hearing. °¨ Thinking. °¨ Mood. °MAKE SURE YOU:  °· Understand these instructions. °· Will watch your condition. °· Will get help right away if you are not doing well or get worse. °  °This information is not intended to replace advice given to you by your health care provider. Make sure you discuss any questions you have with your health care provider. °  °Document Released: 12/11/2000  Document Revised: 11/01/2014 Document Reviewed: 09/22/2013 °Elsevier Interactive Patient Education ©2016 [employer] ° °

## 2015-07-20 NOTE — ED Notes (Signed)
Pt noted to be sleeping, in NAD

## 2015-07-20 NOTE — ED Notes (Signed)
Pt to xray

## 2015-07-20 NOTE — ED Notes (Signed)
O2 sats at 89% while pt is sleeping. EDP aware and at bedside

## 2015-07-21 ENCOUNTER — Encounter (HOSPITAL_COMMUNITY): Payer: Self-pay | Admitting: *Deleted

## 2015-07-21 ENCOUNTER — Emergency Department (HOSPITAL_COMMUNITY)
Admission: EM | Admit: 2015-07-21 | Discharge: 2015-07-22 | Disposition: A | Discharge status: Home / Self Care | Payer: Medicare Other | Attending: Emergency Medicine | Admitting: Emergency Medicine

## 2015-07-21 DIAGNOSIS — E119 Type 2 diabetes mellitus without complications: Secondary | ICD-10-CM | POA: Insufficient documentation

## 2015-07-21 DIAGNOSIS — Z8669 Personal history of other diseases of the nervous system and sense organs: Secondary | ICD-10-CM | POA: Insufficient documentation

## 2015-07-21 DIAGNOSIS — Z79899 Other long term (current) drug therapy: Secondary | ICD-10-CM | POA: Insufficient documentation

## 2015-07-21 DIAGNOSIS — F329 Major depressive disorder, single episode, unspecified: Secondary | ICD-10-CM | POA: Insufficient documentation

## 2015-07-21 DIAGNOSIS — I509 Heart failure, unspecified: Secondary | ICD-10-CM | POA: Insufficient documentation

## 2015-07-21 DIAGNOSIS — F209 Schizophrenia, unspecified: Secondary | ICD-10-CM | POA: Insufficient documentation

## 2015-07-21 DIAGNOSIS — F1994 Other psychoactive substance use, unspecified with psychoactive substance-induced mood disorder: Secondary | ICD-10-CM | POA: Diagnosis present

## 2015-07-21 DIAGNOSIS — F17210 Nicotine dependence, cigarettes, uncomplicated: Secondary | ICD-10-CM | POA: Insufficient documentation

## 2015-07-21 DIAGNOSIS — F1420 Cocaine dependence, uncomplicated: Secondary | ICD-10-CM | POA: Diagnosis present

## 2015-07-21 DIAGNOSIS — I10 Essential (primary) hypertension: Secondary | ICD-10-CM | POA: Insufficient documentation

## 2015-07-21 DIAGNOSIS — F1414 Cocaine abuse with cocaine-induced mood disorder: Secondary | ICD-10-CM | POA: Insufficient documentation

## 2015-07-21 DIAGNOSIS — Z590 Homelessness: Secondary | ICD-10-CM | POA: Insufficient documentation

## 2015-07-21 DIAGNOSIS — Z8619 Personal history of other infectious and parasitic diseases: Secondary | ICD-10-CM | POA: Insufficient documentation

## 2015-07-21 DIAGNOSIS — G8929 Other chronic pain: Secondary | ICD-10-CM | POA: Insufficient documentation

## 2015-07-21 LAB — COMPREHENSIVE METABOLIC PANEL
ALBUMIN: 3.2 g/dL — AB (ref 3.5–5.0)
ALK PHOS: 97 U/L (ref 38–126)
ALT: 28 U/L (ref 17–63)
AST: 25 U/L (ref 15–41)
Anion gap: 9 (ref 5–15)
BILIRUBIN TOTAL: 0.9 mg/dL (ref 0.3–1.2)
BUN: 21 mg/dL — ABNORMAL HIGH (ref 6–20)
CALCIUM: 8.7 mg/dL — AB (ref 8.9–10.3)
CO2: 24 mmol/L (ref 22–32)
CREATININE: 0.78 mg/dL (ref 0.61–1.24)
Chloride: 107 mmol/L (ref 101–111)
GFR calc Af Amer: >60 mL/min (ref >60)
GFR calc non Af Amer: >60 mL/min (ref >60)
Glucose, Bld: 195 mg/dL — ABNORMAL HIGH (ref 65–99)
POTASSIUM: 3.8 mmol/L (ref 3.5–5.1)
SODIUM: 140 mmol/L (ref 135–145)
TOTAL PROTEIN: 6.7 g/dL (ref 6.5–8.1)

## 2015-07-21 LAB — ETHANOL

## 2015-07-21 LAB — CBC
HCT: 34.4 % — ABNORMAL LOW (ref 39.0–52.0)
HEMOGLOBIN: 10.6 g/dL — AB (ref 13.0–17.0)
MCH: 29.1 pg (ref 26.0–34.0)
MCHC: 30.8 g/dL (ref 30.0–36.0)
MCV: 94.5 fL (ref 78.0–100.0)
PLATELETS: 367 10*3/uL (ref 150–400)
RBC: 3.64 MIL/uL — ABNORMAL LOW (ref 4.22–5.81)
RDW: 15.7 % — AB (ref 11.5–15.5)
WBC: 6.7 10*3/uL (ref 4.0–10.5)

## 2015-07-21 LAB — SALICYLATE LEVEL

## 2015-07-21 LAB — ACETAMINOPHEN LEVEL: Acetaminophen (Tylenol), Serum: <10 ug/mL — ABNORMAL LOW (ref 10–30)

## 2015-07-21 MED ORDER — AMANTADINE HCL 100 MG PO CAPS
100.0000 mg | ORAL_CAPSULE | Freq: Two times a day (BID) | ORAL | Status: DC
  Administered 2015-07-22: 100 mg via ORAL
  Filled 2015-07-21 (×2): qty 1

## 2015-07-21 MED ORDER — ARIPIPRAZOLE 5 MG PO TABS: 5.0000 mg | ORAL_TABLET | Freq: Two times a day (BID) | ORAL | Status: DC

## 2015-07-21 NOTE — ED Notes (Signed)
Pt states he is suicidal. Pt initially states he was also homicidal, but now states he only wants to hurt himself. Pt states he would hang himself. Pt states he has felt suicidal since yesterday, when he states "a snake went into my mouth and came out of my rectum". Pt states he has pain in his rectum.

## 2015-07-21 NOTE — ED Notes (Signed)
Pt. Made aware of need for a urine sample. Cup given.

## 2015-07-21 NOTE — BH Assessment (Addendum)
Tele Assessment Note   [name] is an 55 y.o. male single, African-American who presents unaccompanied to [facility] ED reporting suicidal ideation. Pt has a history of schizophrenia, substance abuse and multiple visits to the ED. Pt was discharged from ED yesterday. He reports that "people in the hood are trying to kill me" because Pt owes them money. He states that he use approximately on gram of cocaine today. He reports that he is hearing voices and that he is having "snake going from my throat to my rectum." He reports decreased sleep, decreased eating and feeling tired and hopeless. He states that he is depressed and having suicidal ideation with plan to hang himself. He says he has run out of money. He denies homicidal ideation, thoughts of harming others or a history of violence. Pt reports he is only using crack at this time, no alcohol or other drugs, and Pt's urine drug screen is in process. Pt reports normally using two grams of crack daily.  Pt reports he is living with his cousin and can return there but has been on the streets. He says he has not taken psychiatric medication regularly in over a year. Pt denied earlier this week he has any outpatient providers but today says he is followed by [facility]. Pt reports he was arrested for possession of cocaine, had a court appearance 07/19/15 and has another court date 08/01/15.  Pt is disheveled, dressed in hospital scrubs, alert, oriented x4 with soft, hoarse speech and normal motor behavior. Eye contact is good. Pt's mood is depressed and anxious; affect is congruent with mood. Thought process is coherent and relevant. Pt says he wants to go to [facility] for inpatient treatment.   Diagnosis: Cocaine Use Disorder, Severe; Schizophrenia  Past Medical History:  Past Medical History  Diagnosis Date  . Diabetes mellitus without complication (HCC)   . Hypertension   . Schizophrenia (HCC)   . CHF (congestive heart failure) (HCC)   .  Neuropathy (HCC)   . Polysubstance abuse   . Cocaine abuse   . Homelessness   . Hepatitis C   . Chronic foot pain   . Homelessness     History reviewed. No pertinent past surgical history.  Family History:  Family History  Problem Relation Age of Onset  . Hypertension Other   . Diabetes Other     Social History:  reports that he has been smoking Cigarettes.  He has a 20 pack-year smoking history. He uses smokeless tobacco. He reports that he drinks alcohol. He reports that he uses illicit drugs ("Crack" cocaine, Cocaine, and Marijuana) about 7 times per week.  Additional Social History:  Alcohol / Drug Use Pain Medications: See PTA medications Prescriptions: See PTA medications Over the Counter: See PTA medications History of alcohol / drug use?: Yes Longest period of sobriety (when/how long): Unknown Negative Consequences of Use: Financial, Legal, Personal relationships, Work / School Substance #1 Name of Substance 1: Crack cocaine 1 - Age of First Use: 30's 1 - Amount (size/oz): 2 grams 1 - Frequency: Daily 1 - Duration: On-going 1 - Last Use / Amount: 07/21/15, one gram Substance #2 Name of Substance 2: Marijuana 2 - Age of First Use: Teens 2 - Amount (size/oz): one joint at a time 2 - Frequency: unknown 2 - Duration: On going 2 - Last Use / Amount: Unknown Substance #3 Name of Substance 3: ETOH 3 - Age of First Use: Teens 3 - Amount (size/oz): Varies 3 -  Frequency: Often, 4-5 times in a month 3 - Duration: On-going 3 - Last Use / Amount: Unknown  CIWA: CIWA-Ar BP: 177/92 mmHg Pulse Rate: 101 COWS:    PATIENT STRENGTHS: (choose at least two) Average or above average intelligence Capable of independent living Financial means Supportive family/friends  Allergies:  Allergies  Allergen Reactions  . Haldol [Haloperidol] Other (See Comments)    Muscle spasms, loss of voluntary movement. However, pt has taken Thorazine on multiple occasions with no adverse  effects.     Home Medications:  (Not in a hospital admission)  OB/GYN Status:  No LMP for male patient.  General Assessment Data Location of Assessment: [facility] ED TTS Assessment: In system Is this a Tele or Face-to-Face Assessment?: Tele Assessment Is this an Initial Assessment or a Re-assessment for this encounter?: Initial Assessment Marital status: Single Maiden name: NA Is patient pregnant?: No Pregnancy Status: No Living Arrangements: Other (Comment) (Staying with cousin and on the streets) Can pt return to current living arrangement?: Yes Admission Status: Voluntary Is patient capable of signing voluntary admission?: Yes Referral Source: Self/Family/Friend Insurance type: Self-pay     Crisis Care Plan Living Arrangements: Other (Comment) (Staying with cousin and on the streets) Legal Guardian: Other: (None) Name of Psychiatrist: [facility] Name of Therapist: [facility]  Education Status Is patient currently in school?: No Current Grade: NA Highest grade of school patient has completed: 12th grade Name of school: NA Contact person: NA  Risk to self with the past 6 months Suicidal Ideation: Yes-Currently Present Has patient been a risk to self within the past 6 months prior to admission? : Yes Suicidal Intent: Yes-Currently Present Has patient had any suicidal intent within the past 6 months prior to admission? : Yes Is patient at risk for suicide?: Yes Suicidal Plan?: Yes-Currently Present Has patient had any suicidal plan within the past 6 months prior to admission? : Yes Specify Current Suicidal Plan: Pt reports plan to strangle himself Access to Means: Yes Specify Access to Suicidal Means: Access to household items What has been your use of drugs/alcohol within the last 12 months?: Pt is abusing crack Previous Attempts/Gestures: Yes How many times?: 3 (multiple attemps) Other Self Harm Risks: None Triggers for Past Attempts: None known, Unpredictable Intentional Self  Injurious Behavior: None Comment - Self Injurious Behavior: Pt denies Family Suicide History: No Recent stressful life event(s): Financial Problems, Legal Issues, Recent negative physical changes Persecutory voices/beliefs?: Yes Depression: Yes Depression Symptoms: Despondent, Guilt, Feeling angry/irritable Substance abuse history and/or treatment for substance abuse?: Yes Suicide prevention information given to non-admitted patients: Not applicable  Risk to Others within the past 6 months Homicidal Ideation: No Does patient have any lifetime risk of violence toward others beyond the six months prior to admission? : No Thoughts of Harm to Others: No Comment - Thoughts of Harm to Others: Pt denies thoughts of harming others Current Homicidal Intent: No Current Homicidal Plan: No Describe Current Homicidal Plan: None Access to Homicidal Means: No Describe Access to Homicidal Means: None Identified Victim: None History of harm to others?: No Assessment of Violence: None Noted Violent Behavior Description: None Does patient have access to weapons?: No Criminal Charges Pending?: Yes Describe Pending Criminal Charges: Possession of cocaine Does patient have a court date: Yes Court Date: 08/01/15 Is patient on probation?: No  Psychosis Hallucinations: None noted Delusions: Persecutory (See assessment note)  Mental Status Report Appearance/Hygiene: Disheveled, In scrubs Eye Contact: Fair Motor Activity: Unremarkable Speech: Soft Level of Consciousness: Alert Mood:  Anxious Affect: Anxious Anxiety Level: Moderate Thought Processes: Coherent, Relevant Judgement: Partial Orientation: Person, Place, Time, Situation, Appropriate for developmental age Obsessive Compulsive Thoughts/Behaviors: None  Cognitive Functioning Concentration: Normal Memory: Recent Intact, Remote Intact IQ: Average Insight: Poor Impulse Control: Fair Appetite: Good Weight Loss: 0 Weight Gain:  0 Sleep: No Change Total Hours of Sleep: 6 Vegetative Symptoms: None  ADLScreening [facility] Assessment Services) Patient's cognitive ability adequate to safely complete daily activities?: Yes Patient able to express need for assistance with ADLs?: Yes Independently performs ADLs?: Yes (appropriate for developmental age)  Prior Inpatient Therapy Prior Inpatient Therapy: Yes Prior Therapy Dates: multiple Prior Therapy Facilty/Provider(s): various Reason for Treatment: schizophrenia, SI/HI  Prior Outpatient Therapy Prior Outpatient Therapy: Yes Prior Therapy Dates: sporatically Prior Therapy Facilty/Provider(s): [name] Reason for Treatment: med mgmt Does patient have an ACCT team?: Yes (Pt says he is followed by [facility]) Does patient have Intensive In-House Services?  : No Does patient have [name] services? : No Does patient have P4CC services?: No  ADL Screening (condition at time of admission) Patient's cognitive ability adequate to safely complete daily activities?: Yes Is the patient deaf or have difficulty hearing?: No Does the patient have difficulty seeing, even when wearing glasses/contacts?: No Does the patient have difficulty concentrating, remembering, or making decisions?: No Patient able to express need for assistance with ADLs?: Yes Does the patient have difficulty dressing or bathing?: No Independently performs ADLs?: Yes (appropriate for developmental age) Does the patient have difficulty walking or climbing stairs?: No Weakness of Legs: None Weakness of Arms/Hands: None  Home Assistive Devices/Equipment Home Assistive Devices/Equipment: None    Abuse/Neglect Assessment (Assessment to be complete while patient is alone) Physical Abuse: Denies Verbal Abuse: Denies Sexual Abuse: Denies Exploitation of patient/patient's resources: Denies Self-Neglect: Denies     Merchant navy officer (For Healthcare) Does patient have an advance directive?: No Would patient like  information on creating an advanced directive?: No - patient declined information    Additional Information 1:1 In Past 12 Months?: No CIRT Risk: No Elopement Risk: No Does patient have medical clearance?: Yes     Disposition: Binnie Rail, AC at [facility], confirms adult unit is currently at capacity. Gave clinical report to Hulan Fess, NP who recommended [facility] come to evaluate Pt and for psychiatry to evaluate Pt in the morning if [facility] doesn't take Pt tonight. Contacted [facility] at [phone] and spoke to [place] who said he would check to see if Pt has ACTT services and will contact TTS at [facility]. Notified Dr. Bethann Berkshire and Marita Snellen, RN of recommendation.  Disposition Initial Assessment Completed for this Encounter: Yes Disposition of Patient: Other dispositions Type of inpatient treatment program: Adult Other disposition(s): Other (Comment) Patient referred to: Other (Comment)   Pamalee Leyden, [facility], [facility], [facility] Triage Specialist [phone]   Patsy Baltimore, Harlin Rain 07/21/2015 11:04 PM

## 2015-07-21 NOTE — ED Notes (Signed)
Pt. To [facility] from ED ambulatory without difficulty, to room 36 . Report from [facility]. Pt. Is alert and oriented, warm and dry in no distress. Pt. Denies HI, and AH. Pt. States he still has SI without a plan and AH of non command. Pt. Calm and cooperative. Pt. Made aware of security cameras and Q15 minute rounds. Pt. Encouraged to let Nursing staff know of any concerns or needs.

## 2015-07-21 NOTE — BH Assessment (Signed)
Received call from [place] with [facility] who said Pt is an active client. She will come to [facility] tonight to evaluate Pt. Notified Marita Snellen, RN.  Harlin Rain Patsy Baltimore, LPC, [facility], [facility] Triage Specialist [phone]

## 2015-07-21 NOTE — ED Provider Notes (Signed)
CSN: [id number]     Arrival date & time 07/21/15  2037 History   First MD Initiated Contact with Patient 07/21/15 2142     Chief Complaint  Patient presents with  . Suicidal     (Consider location/radiation/quality/duration/timing/severity/associated sxs/prior Treatment) Patient is a 55 y.o. male presenting with altered mental status. The history is provided by the patient (The patient states that he wants to kill himself. His plan is to strangle himself).  Altered Mental Status Presenting symptoms: behavior changes   Severity:  Moderate Most recent episode:  Today Episode history:  Multiple Timing:  Constant Progression:  Worsening Chronicity:  Recurrent Context: alcohol use   Associated symptoms: no abdominal pain, no hallucinations, no headaches, no rash and no seizures     Past Medical History  Diagnosis Date  . Diabetes mellitus without complication (HCC)   . Hypertension   . Schizophrenia (HCC)   . CHF (congestive heart failure) (HCC)   . Neuropathy (HCC)   . Polysubstance abuse   . Cocaine abuse   . Homelessness   . Hepatitis C   . Chronic foot pain   . Homelessness    History reviewed. No pertinent past surgical history. Family History  Problem Relation Age of Onset  . Hypertension Other   . Diabetes Other    Social History  Substance Use Topics  . Smoking status: Current Every Day Smoker -- 1.00 packs/day for 20 years    Types: Cigarettes  . Smokeless tobacco: Current User  . Alcohol Use: Yes     Comment: Daily Drinker     Review of Systems  Constitutional: Negative for appetite change and fatigue.  HENT: Negative for congestion, ear discharge and sinus pressure.   Eyes: Negative for discharge.  Respiratory: Negative for cough.   Cardiovascular: Negative for chest pain.  Gastrointestinal: Negative for abdominal pain and diarrhea.  Genitourinary: Negative for frequency and hematuria.  Musculoskeletal: Negative for back pain.  Skin: Negative for  rash.  Neurological: Negative for seizures and headaches.  Psychiatric/Behavioral: Positive for dysphoric mood. Negative for hallucinations.      Allergies  Haldol  Home Medications   Prior to Admission medications   Medication Sig Start Date End Date Taking? Authorizing Provider  amantadine (SYMMETREL) 100 MG capsule Take 1 capsule (100 mg total) by mouth 2 (two) times daily. 06/07/15   Earney Navy, NP  ARIPiprazole (ABILIFY) 5 MG tablet Take 1 tablet (5 mg total) by mouth 2 (two) times daily after a meal. 06/07/15   Earney Navy, NP   BP 177/92 mmHg  Pulse 101  Temp(Src) 98.1 F (36.7 C) (Oral)  Resp 18  SpO2 99% Physical Exam  Constitutional: He is oriented to person, place, and time. He appears well-developed.  HENT:  Head: Normocephalic.  Eyes: Conjunctivae and EOM are normal. No scleral icterus.  Neck: Neck supple. No thyromegaly present.  Cardiovascular: Normal rate and regular rhythm.  Exam reveals no gallop and no friction rub.   No murmur heard. Pulmonary/Chest: No stridor. He has no wheezes. He has no rales. He exhibits no tenderness.  Abdominal: He exhibits no distension. There is no tenderness. There is no rebound.  Musculoskeletal: Normal range of motion. He exhibits no edema.  Lymphadenopathy:    He has no cervical adenopathy.  Neurological: He is oriented to person, place, and time. He exhibits normal muscle tone. Coordination normal.  Skin: No rash noted. No erythema.  Psychiatric: He has a normal mood and affect.  Depressed and  suicidal    ED Course  Procedures (including critical care time) Labs Review Labs Reviewed  CBC - Abnormal; Notable for the following:    RBC 3.64 (*)    Hemoglobin 10.6 (*)    HCT 34.4 (*)    RDW 15.7 (*)    All other components within normal limits  COMPREHENSIVE METABOLIC PANEL  ETHANOL  SALICYLATE LEVEL  ACETAMINOPHEN LEVEL  URINE RAPID DRUG SCREEN, HOSP PERFORMED    Imaging Review Dg Chest 2  View  07/20/2015  CLINICAL DATA:  55 year old male with chest pain and shortness of breath EXAM: CHEST  2 VIEW COMPARISON:  Radiograph dated 07/15/2015 FINDINGS: Two views of the chest demonstrate increased central vascular and interstitial prominence, likely congestive changes. Minimal interstitial prominence in the mid to lower lung fields similar prior study may represent congestion. Superimposed pneumonia is not excluded. There is no focal consolidation, pleural effusion, or pneumothorax. The cardiac silhouette is within normal limits. The osseous structures appear unremarkable. IMPRESSION: Mild congestive changes.  No focal consolidation. Electronically Signed   By: Elgie Collard M.D.   On: 07/20/2015 04:50   I have personally reviewed and evaluated these images and lab results as part of my medical decision-making.   EKG Interpretation None      MDM   Final diagnoses:  None    Suicidal depressed    Bethann Berkshire, MD 07/21/15 2153

## 2015-07-22 DIAGNOSIS — F1994 Other psychoactive substance use, unspecified with psychoactive substance-induced mood disorder: Secondary | ICD-10-CM

## 2015-07-22 LAB — RAPID URINE DRUG SCREEN, HOSP PERFORMED
Amphetamines: NOT DETECTED
Barbiturates: NOT DETECTED
Benzodiazepines: NOT DETECTED
Cocaine: POSITIVE — AB
OPIATES: NOT DETECTED
Tetrahydrocannabinol: NOT DETECTED

## 2015-07-22 MED ORDER — CHLORPROMAZINE HCL 50 MG PO TABS: 50.0000 mg | ORAL_TABLET | Freq: Every day | ORAL | Status: DC

## 2015-07-22 MED ORDER — CHLORPROMAZINE HCL 25 MG PO TABS
50.0000 mg | ORAL_TABLET | Freq: Every day | ORAL | Status: DC
  Administered 2015-07-22: 50 mg via ORAL
  Filled 2015-07-22: qty 2

## 2015-07-22 MED ORDER — AMANTADINE HCL 100 MG PO CAPS: 100.0000 mg | ORAL_CAPSULE | Freq: Two times a day (BID) | ORAL | Status: DC

## 2015-07-22 NOTE — ED Notes (Signed)
Pt. Noted sleeping in room. No complaints or concerns voiced. No distress or abnormal behavior noted. Will continue to monitor with security cameras. Q 15 minute rounds continue. 

## 2015-07-22 NOTE — ED Notes (Signed)
Pt. Noted in room with Act Team member at bedside. No complaints or concerns voiced. No distress or abnormal behavior noted. Will continue to monitor with security cameras. Q 15 minute rounds continue.

## 2015-07-22 NOTE — Clinical Social Work Note (Signed)
CSW met with pt to discuss discharge plans.  Pt discussed an interest in finding a group home and stated that he knew he would need an FL2.  CSW provided information regarding group homes and the process and initiated an FL2 that can go with pt when he discharges today.  CSW will also discuss plans with pt's Act team when they arrive to pick him up.  [place] [facility] [phone]

## 2015-07-22 NOTE — Clinical Social Work Note (Signed)
CSW completed FL2 and provided it to pt.  Pt stated that he would work with his Act team regarding placement but also knew of places that he could go if he had the FL2.  Pt's Act team should be here shortly to pick up pt.  Elray Buba LCSW  [place] ED [phone]

## 2015-07-22 NOTE — ED Notes (Signed)
Social work into talk w/ pt

## 2015-07-22 NOTE — Consult Note (Signed)
[facility] Consult   Reason for Consult:  Psychiatric evaluation Referring Physician:  EDP Patient Identification: [name] MRN:  [id number] Principal Diagnosis: Substance induced mood disorder [facility]) Diagnosis:   Patient Active Problem List   Diagnosis Date Noted  . Substance induced mood disorder ([place]) [F19.94] 03/13/2015    Priority: High  . Suicidal ideations [R45.851]   . Acute kidney failure ([place]) [N17.9] 01/26/2015  . Schizophrenia, paranoid type ([place]) [F20.0] 01/17/2015  . Suicidal ideation [R45.851]   . Drug hallucinosis ([place]) [F19.951] 10/08/2014  . Chronic paranoid schizophrenia ([place]) [F20.0] 09/07/2014  . Substance or medication-induced bipolar and related disorder with onset during intoxication ([place]) [F19.94] 08/10/2014  . Acute CHF (congestive heart failure) ([place]) [I50.9] 07/29/2014  . Cocaine use disorder, severe, dependence ([place]) [F14.20]   . Essential hypertension, benign [I10] 03/28/2013  . Diabetes mellitus ([place]) [E11.9] 03/15/2013    Total Time spent with patient: 35 minutes  Subjective:   [name] is a 55 y.o. male patient who states "I been smoking crack but I'm ready to go now."  HPI:  [name] is a 55 yo [place] American male who presented to [facility] ED voluntarily after becoming suicidal after "going on a crack binge." His UDS is positive for cocaine. [name] is known to the psychiatric service with multiple visits for similar complaints. His current medication regimen is symmetrel and abilify. He states being compliant with meds but feels the abilify is not working. He feels Thorazine works better for him. He goes to [facility] for medication management.   Today, he denies suicidal or homicidal ideation, intent or plan. He denies AVH.    Past Psychiatric History: Paranoid schizophrenia, Cocaine use disorder  Risk to Self: Suicidal Ideation: Yes-Currently Present Suicidal Intent: Yes-Currently  Present Is patient at risk for suicide?: Yes Suicidal Plan?: Yes-Currently Present Specify Current Suicidal Plan: Pt reports plan to strangle himself Access to Means: Yes Specify Access to Suicidal Means: Access to household items What has been your use of drugs/alcohol within the last 12 months?: Pt is abusing crack How many times?: 3 (multiple attemps) Other Self Harm Risks: None Triggers for Past Attempts: None known, Unpredictable Intentional Self Injurious Behavior: None Comment - Self Injurious Behavior: Pt denies Risk to Others: Homicidal Ideation: No Thoughts of Harm to Others: No Comment - Thoughts of Harm to Others: Pt denies thoughts of harming others Current Homicidal Intent: No Current Homicidal Plan: No Describe Current Homicidal Plan: None Access to Homicidal Means: No Describe Access to Homicidal Means: None Identified Victim: None History of harm to others?: No Assessment of Violence: None Noted Violent Behavior Description: None Does patient have access to weapons?: No Criminal Charges Pending?: Yes Describe Pending Criminal Charges: Possession of cocaine Does patient have a court date: Yes Court Date: 08/01/15 Prior Inpatient Therapy: Prior Inpatient Therapy: Yes Prior Therapy Dates: multiple Prior Therapy Facilty/Provider(s): various Reason for Treatment: schizophrenia, SI/HI Prior Outpatient Therapy: Prior Outpatient Therapy: Yes Prior Therapy Dates: sporatically Prior Therapy Facilty/Provider(s): [name] Reason for Treatment: med mgmt Does patient have an ACCT team?: Yes (Pt says he is followed by [facility]) Does patient have Intensive In-House Services?  : No Does patient have [name] services? : No Does patient have P4CC services?: No  Past Medical History:  Past Medical History  Diagnosis Date  . Diabetes mellitus without complication ([place])   . Hypertension   . Schizophrenia ([place])   . CHF (congestive heart failure) ([place])   . Neuropathy ([place])   .  Polysubstance abuse   . Cocaine abuse   . Homelessness   . Hepatitis C   . Chronic foot pain   . Homelessness    History reviewed. No pertinent past surgical history. Family History:  Family History  Problem Relation Age of Onset  . Hypertension Other   . Diabetes Other    Family Psychiatric  History: Unknown Social History:  History  Alcohol Use  . Yes    Comment: Daily Drinker      History  Drug Use  . 7.00 per week  . Special: "Crack" cocaine, Cocaine, Marijuana    Comment: Cocaine tonight, Marijuana "a long time"    Social History   Social History  . Marital Status: Divorced    Spouse Name: N/A  . Number of Children: N/A  . Years of Education: N/A   Social History Main Topics  . Smoking status: Current Every Day Smoker -- 1.00 packs/day for 20 years    Types: Cigarettes  . Smokeless tobacco: Current User  . Alcohol Use: Yes     Comment: Daily Drinker   . Drug Use: 7.00 per week    Special: "Crack" cocaine, Cocaine, Marijuana     Comment: Cocaine tonight, Marijuana "a long time"  . Sexual Activity: No   Other Topics Concern  . None   Social History Narrative   ** Merged History Encounter **       Additional Social History:    Pain Medications: See PTA medications Prescriptions: See PTA medications Over the Counter: See PTA medications History of alcohol / drug use?: Yes Longest period of sobriety (when/how long): Unknown Negative Consequences of Use: Financial, Legal, Personal relationships, Work / School Name of Substance 1: Crack cocaine 1 - Age of First Use: 30's 1 - Amount (size/oz): 2 grams 1 - Frequency: Daily 1 - Duration: On-going 1 - Last Use / Amount: 07/21/15, one gram Name of Substance 2: Marijuana 2 - Age of First Use: Teens 2 - Amount (size/oz): one joint at a time 2 - Frequency: unknown 2 - Duration: On going 2 - Last Use / Amount: Unknown Name of Substance 3: ETOH 3 - Age of First Use: Teens 3 - Amount (size/oz): Varies 3 -  Frequency: Often, 4-5 times in a month 3 - Duration: On-going 3 - Last Use / Amount: Unknown          Allergies:   Allergies  Allergen Reactions  . Haldol [Haloperidol] Other (See Comments)    Muscle spasms, loss of voluntary movement. However, pt has taken Thorazine on multiple occasions with no adverse effects.     Labs:  Results for orders placed or performed during the hospital encounter of 07/21/15 (from the past 48 hour(s))  Ethanol (ETOH)     Status: None   Collection Time: 07/21/15  9:06 PM  Result Value Ref Range   Alcohol, Ethyl (B) <5 <5 mg/dL    Comment:        LOWEST DETECTABLE LIMIT FOR SERUM ALCOHOL IS 5 mg/dL FOR MEDICAL PURPOSES ONLY   Salicylate level     Status: None   Collection Time: 07/21/15  9:06 PM  Result Value Ref Range   Salicylate Lvl <6.2 2.8 - 30.0 mg/dL  Acetaminophen level     Status: Abnormal   Collection Time: 07/21/15  9:06 PM  Result Value Ref Range   Acetaminophen (Tylenol), Serum <10 (L) 10 - 30 ug/mL    Comment:        THERAPEUTIC CONCENTRATIONS  VARY SIGNIFICANTLY. A RANGE OF 10-30 ug/mL MAY BE AN EFFECTIVE CONCENTRATION FOR MANY PATIENTS. HOWEVER, SOME ARE BEST TREATED AT CONCENTRATIONS OUTSIDE THIS RANGE. ACETAMINOPHEN CONCENTRATIONS >150 ug/mL AT 4 HOURS AFTER INGESTION AND >50 ug/mL AT 12 HOURS AFTER INGESTION ARE OFTEN ASSOCIATED WITH TOXIC REACTIONS.   Comprehensive metabolic panel     Status: Abnormal   Collection Time: 07/21/15  9:11 PM  Result Value Ref Range   Sodium 140 135 - 145 mmol/L   Potassium 3.8 3.5 - 5.1 mmol/L   Chloride 107 101 - 111 mmol/L   CO2 24 22 - 32 mmol/L   Glucose, Bld 195 (H) 65 - 99 mg/dL   BUN 21 (H) 6 - 20 mg/dL   Creatinine, Ser 0.78 0.61 - 1.24 mg/dL   Calcium 8.7 (L) 8.9 - 10.3 mg/dL   Total Protein 6.7 6.5 - 8.1 g/dL   Albumin 3.2 (L) 3.5 - 5.0 g/dL   AST 25 15 - 41 U/L   ALT 28 17 - 63 U/L   Alkaline Phosphatase 97 38 - 126 U/L   Total Bilirubin 0.9 0.3 - 1.2 mg/dL   GFR  calc non Af Amer >60 >60 mL/min   GFR calc Af Amer >60 >60 mL/min    Comment: (NOTE) The eGFR has been calculated using the CKD EPI equation. This calculation has not been validated in all clinical situations. eGFR's persistently <60 mL/min signify possible Chronic Kidney Disease.    Anion gap 9 5 - 15  CBC     Status: Abnormal   Collection Time: 07/21/15  9:11 PM  Result Value Ref Range   WBC 6.7 4.0 - 10.5 K/uL   RBC 3.64 (L) 4.22 - 5.81 MIL/uL   Hemoglobin 10.6 (L) 13.0 - 17.0 g/dL   HCT 34.4 (L) 39.0 - 52.0 %   MCV 94.5 78.0 - 100.0 fL   MCH 29.1 26.0 - 34.0 pg   MCHC 30.8 30.0 - 36.0 g/dL   RDW 15.7 (H) 11.5 - 15.5 %   Platelets 367 150 - 400 K/uL  Urine rapid drug screen (hosp performed) (Not at [facility])     Status: Abnormal   Collection Time: 07/22/15  5:54 AM  Result Value Ref Range   Opiates NONE DETECTED NONE DETECTED   Cocaine POSITIVE (A) NONE DETECTED   Benzodiazepines NONE DETECTED NONE DETECTED   Amphetamines NONE DETECTED NONE DETECTED   Tetrahydrocannabinol NONE DETECTED NONE DETECTED   Barbiturates NONE DETECTED NONE DETECTED    Comment:        DRUG SCREEN FOR MEDICAL PURPOSES ONLY.  IF CONFIRMATION IS NEEDED FOR ANY PURPOSE, NOTIFY LAB WITHIN 5 DAYS.        LOWEST DETECTABLE LIMITS FOR URINE DRUG SCREEN Drug Class       Cutoff (ng/mL) Amphetamine      1000 Barbiturate      200 Benzodiazepine   322 Tricyclics       025 Opiates          300 Cocaine          300 THC              50     No current facility-administered medications for this encounter.   Current Outpatient Prescriptions  Medication Sig Dispense Refill  . amantadine (SYMMETREL) 100 MG capsule Take 1 capsule (100 mg total) by mouth 2 (two) times daily. 60 capsule 0  . amantadine (SYMMETREL) 100 MG capsule Take 1 capsule (100 mg total) by mouth 2 (  two) times daily. 60 capsule 0  . chlorproMAZINE (THORAZINE) 50 MG tablet Take 1 tablet (50 mg total) by mouth daily. 30 tablet 0     Musculoskeletal: Strength & Muscle Tone: within normal limits Gait & Station: normal Patient leans: N/A  Psychiatric Specialty Exam: Review of Systems  Constitutional: Negative.   HENT: Negative.   Eyes: Negative.   Respiratory: Positive for cough.        Sore throat  Cardiovascular: Negative.   Gastrointestinal: Negative.   Genitourinary: Negative.   Musculoskeletal: Positive for joint pain.  Skin: Negative.   Neurological: Negative.   Endo/Heme/Allergies: Negative.   Psychiatric/Behavioral: Positive for substance abuse.    Blood pressure 154/83, pulse 79, temperature 97.7 F (36.5 C), temperature source Oral, resp. rate 18, SpO2 96 %.There is no weight on file to calculate BMI.  General Appearance: Fairly Groomed  Engineer, water::  Fair  Speech:  Clear and Coherent  Volume:  Increased  Mood:  Anxious  Affect:  Congruent  Thought Process:  Coherent  Orientation:  Full (Time, Place, and Person)  Thought Content:  No psychosis, no hallucinations, no delusions  Suicidal Thoughts:  No  Homicidal Thoughts:  No  Memory:  Immediate;   Fair Recent;   Fair Remote;   Fair  Judgement:  Poor  Insight:  Shallow  Psychomotor Activity:  Normal  Concentration:  Fair  Recall:  [name]: Fair  Akathisia:  No  Handed:  Right  AIMS (if indicated):     Assets:  Communication Skills Desire for Improvement Resilience  ADL's:  Intact  Cognition: WNL  Sleep:       Disposition: No evidence of imminent risk to self or others at present.   Patient does not meet criteria for psychiatric inpatient admission. Supportive therapy provided about ongoing stressors.  D/C Abilify. Start Thorazine 50 mg PO daily. Rx for 30 day supply of Thorazine and Symmetrel given to patient at discharge. Patient was instructed to follow up with [facility] for further medication management.   Serena Colonel, FNP-BC [place] 07/22/2015 9:16 PM Patient seen  face-to-face for psychiatric evaluation, chart reviewed and case discussed with the physician extender and developed treatment plan. Reviewed the information documented and agree with the treatment plan. Corena Pilgrim, MD

## 2015-07-22 NOTE — ED Notes (Signed)
Pt  dc'd home per Drenda Freeze NP [facility]

## 2015-07-22 NOTE — NC FL2 (Signed)
  [place] MEDICAID FL2 LEVEL OF CARE SCREENING TOOL     IDENTIFICATION  Patient Name: [name] Birthdate: 1961-07-01 Sex: male Admission Date (Current Location): 07/21/2015  [facility] and [place] Number:  Producer, television/film/video and Address:  [facility],  [address], [place] [address]      Provider Number: [id number]  Attending Physician Name and Address:  No att. providers found  Relative Name and Phone Number:       Current Level of Care: Hospital Recommended Level of Care:   Prior Approval Number:    Date Approved/Denied:   PASRR Number:    Discharge Plan: Other (Comment) (Group Home)    Current Diagnoses: Patient Active Problem List   Diagnosis Date Noted  . Suicidal ideations   . Substance induced mood disorder (HCC) 03/13/2015  . Acute kidney failure (HCC) 01/26/2015  . Schizophrenia, paranoid type (HCC) 01/17/2015  . Suicidal ideation   . Drug hallucinosis (HCC) 10/08/2014  . Chronic paranoid schizophrenia (HCC) 09/07/2014  . 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 Acute CHF (congestive heart failure) (HCC) 07/29/2014  . Cocaine use disorder, severe, dependence (HCC)   . Essential hypertension, benign 03/28/2013  . Diabetes mellitus (HCC) 03/15/2013    Orientation RESPIRATION BLADDER Height & Weight    Self, Time, Situation, Place  Normal Continent      BEHAVIORAL SYMPTOMS/MOOD NEUROLOGICAL BOWEL NUTRITION STATUS      Continent Diet  AMBULATORY STATUS COMMUNICATION OF NEEDS Skin   Independent Verbally Normal                       Personal Care Assistance Level of Assistance              Functional Limitations Info             SPECIAL CARE FACTORS FREQUENCY                       Contractures      Additional Factors Info  Code Status, Allergies Code Status Info: full code Allergies Info: Haldol           Current  Medications (07/22/2015):  This is the current hospital active medication list Current Facility-Administered Medications  Medication Dose Route Frequency Provider Last Rate Last Dose  . amantadine (SYMMETREL) capsule 100 mg  100 mg Oral BID Bethann Berkshire, MD   100 mg at 07/21/15 2257  . chlorproMAZINE (THORAZINE) tablet 50 mg  50 mg Oral Daily Kristeen Mans, NP       Current Outpatient Prescriptions  Medication Sig Dispense Refill  . acetaminophen (TYLENOL) 500 MG tablet Take 500 mg by mouth every 6 (six) hours as needed for mild pain or headache.    Marland Kitchen amantadine (SYMMETREL) 100 MG capsule Take 1 capsule (100 mg total) by mouth 2 (two) times daily. 60 capsule 0  . amantadine (SYMMETREL) 100 MG capsule Take 1 capsule (100 mg total) by mouth 2 (two) times daily. 60 capsule 0  . chlorproMAZINE (THORAZINE) 50 MG tablet Take 1 tablet (50 mg total) by mouth daily. 30 tablet 0     Discharge Medications: Please see discharge summary for a list of discharge medications.  Relevant Imaging Results:  Relevant Lab Results:   Additional Information    Annetta Maw, LCSW

## 2015-07-22 NOTE — BH Assessment (Signed)
Received call from [place] at [facility]. She said she attempted to assess Pt but he "pretended to be asleep" and refused to engage. She reports he last had his Abilify injection 06/16/15. [facility] staff will follow up tomorrow.  Harlin Rain Patsy [place], LPC, [facility], [facility] Triage Specialist [phone]

## 2015-07-22 NOTE — ED Notes (Signed)
Up tot he bathroom to shower and change scrubs 

## 2015-07-22 NOTE — ED Notes (Addendum)
Written dc instructions and prescriptions x2 reviewed w/ pt.  Pt encouraged to follow up at [facility], take his medications as directed, return/seek treatment for si/hi/avh thoughts/urges,  and work with his ACT team for group home placement.  Pt verbalized understanding.  FL2 given to pt for his ACT team by social work.  Pt ambulatory w/o difficulty to dc area w/ mHt, belongings returned after leaving the area.

## 2015-07-22 NOTE — BHH Suicide Risk Assessment (Signed)
Suicide Risk Assessment  Discharge Assessment   [facility] Discharge Suicide Risk Assessment   Principal Problem: Substance induced mood disorder [facility]) Discharge Diagnoses:  Patient Active Problem List   Diagnosis Date Noted  . Substance induced mood disorder (HCC) [F19.94] 03/13/2015    Priority: High  . Suicidal ideations [R45.851]   . Acute kidney failure (HCC) [N17.9] 01/26/2015  . Schizophrenia, paranoid type (HCC) [F20.0] 01/17/2015  . Suicidal ideation [R45.851]   . 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 Acute CHF (congestive heart failure) (HCC) [I50.9] 07/29/2014  . Cocaine use disorder, severe, dependence (HCC) [F14.20]   . Essential hypertension, benign [I10] 03/28/2013  . Diabetes mellitus (HCC) [E11.9] 03/15/2013    Total Time spent with patient: 20 minutes  Musculoskeletal: Strength & Muscle Tone: within normal limits Gait & Station: normal Patient leans: N/A  Psychiatric Specialty Exam: Blood pressure 154/83, pulse 79, temperature 97.7 F (36.5 C), temperature source Oral, resp. rate 18, SpO2 96 %.There is no weight on file to calculate BMI.  General Appearance: Fairly Groomed  Patent attorney::  Fair  Speech:  Clear and Coherent  Volume:  Increased  Mood:  Anxious  Affect:  Congruent  Thought Process:  Coherent  Orientation:  Full (Time, Place, and Person)  Thought Content:  No psychosis, no hallucinations, no delusions  Suicidal Thoughts:  No  Homicidal Thoughts:  No  Memory:  Immediate;   Fair Recent;   Fair Remote;   Fair  Judgement:  Poor  Insight:  Shallow  Psychomotor Activity:  Normal  Concentration:  Fair  Recall:  [employer] of Knowledge:Fair  Language: Fair  Akathisia:  No  Handed:  Right  AIMS (if indicated):     Assets:  Communication Skills Desire for  Improvement Resilience  ADL's:  Intact  Cognition: WNL  Sleep:      Mental Status Per Nursing Assessment::   On Admission:  Alert, with suicidal ideation  Demographic Factors:  Male, Low socioeconomic status and Unemployed  Loss Factors: Financial problems/change in socioeconomic status  Historical Factors: Family history of mental illness or substance abuse  Risk Reduction Factors:   NA  Continued Clinical Symptoms:  Alcohol/Substance Abuse/Dependencies  Schizophrenia  Cognitive Features That Contribute To Risk:  Closed-mindedness    Suicide Risk:  Mild:  Suicidal ideation of limited frequency, intensity, duration, and specificity.  There are no identifiable plans, no associated intent, mild dysphoria and related symptoms, good self-control (both objective and subjective assessment), few other risk factors, and identifiable protective factors, including available and accessible social support.   Plan Of Care/Follow-up recommendations:  Activity:  As tolereated Diet:  Carb modified Tests:  As determined by PCP or outpatient provider Other:  . 1.Take all your medications as prescribed.  2. Report any adverse side effects to your medication to your outpatient provider. 3. Do not use alcohol or illegal drugs while taking prescription medications. 4. In the event of worsening symptoms, call 911, the crisis hotline or go to nearest emergency room for evaluation of symptoms.  Alberteen Sam, FNP-BC [facility] 07/22/2015, 9:46 PM

## 2015-07-22 NOTE — Clinical Social Work Note (Signed)
CSW spoke with pt's Act team to discuss discharge [phone]).   CSW spoke with jennifer who stated that she would call Lawanna Kobus, who is on call. Lawanna Kobus will call CSW to coordinate discharge  Bernie Osamu Olguin LCSW [facility]

## 2015-07-22 NOTE — ED Notes (Signed)
Dr a and fran np into see

## 2015-07-22 NOTE — ED Notes (Signed)
Act Team member here to see pt.

## 2015-07-22 NOTE — ED Notes (Addendum)
Pt's act team to pick hit up, pt requests to waiting in the W. Room for their arrival.

## 2015-07-22 NOTE — ED Notes (Signed)
upin hall, pleasant, nad, reports that he is going to be dc'd

## 2015-07-22 NOTE — ED Notes (Signed)
Dr Rosalia Hammers updated concerning foot pain-have pt pt elevate/keep clean

## 2015-07-25 ENCOUNTER — Encounter (HOSPITAL_COMMUNITY): Payer: Self-pay | Admitting: Emergency Medicine

## 2015-07-25 ENCOUNTER — Emergency Department (HOSPITAL_COMMUNITY)
Admission: EM | Admit: 2015-07-25 | Discharge: 2015-07-25 | Disposition: A | Discharge status: Home / Self Care | Payer: Medicare Other | Attending: Emergency Medicine | Admitting: Emergency Medicine

## 2015-07-25 DIAGNOSIS — E119 Type 2 diabetes mellitus without complications: Secondary | ICD-10-CM | POA: Insufficient documentation

## 2015-07-25 DIAGNOSIS — I509 Heart failure, unspecified: Secondary | ICD-10-CM | POA: Insufficient documentation

## 2015-07-25 DIAGNOSIS — M7989 Other specified soft tissue disorders: Secondary | ICD-10-CM | POA: Insufficient documentation

## 2015-07-25 DIAGNOSIS — F17210 Nicotine dependence, cigarettes, uncomplicated: Secondary | ICD-10-CM | POA: Insufficient documentation

## 2015-07-25 DIAGNOSIS — Z79899 Other long term (current) drug therapy: Secondary | ICD-10-CM | POA: Insufficient documentation

## 2015-07-25 DIAGNOSIS — F329 Major depressive disorder, single episode, unspecified: Secondary | ICD-10-CM | POA: Insufficient documentation

## 2015-07-25 DIAGNOSIS — F200 Paranoid schizophrenia: Secondary | ICD-10-CM

## 2015-07-25 DIAGNOSIS — G8929 Other chronic pain: Secondary | ICD-10-CM | POA: Insufficient documentation

## 2015-07-25 DIAGNOSIS — Z590 Homelessness: Secondary | ICD-10-CM | POA: Insufficient documentation

## 2015-07-25 DIAGNOSIS — I10 Essential (primary) hypertension: Secondary | ICD-10-CM | POA: Insufficient documentation

## 2015-07-25 DIAGNOSIS — Z8619 Personal history of other infectious and parasitic diseases: Secondary | ICD-10-CM | POA: Insufficient documentation

## 2015-07-25 DIAGNOSIS — F1410 Cocaine abuse, uncomplicated: Secondary | ICD-10-CM | POA: Insufficient documentation

## 2015-07-25 LAB — COMPREHENSIVE METABOLIC PANEL
ALBUMIN: 3.3 g/dL — AB (ref 3.5–5.0)
ALT: 15 U/L — ABNORMAL LOW (ref 17–63)
ANION GAP: 13 (ref 5–15)
AST: 18 U/L (ref 15–41)
Alkaline Phosphatase: 104 U/L (ref 38–126)
BILIRUBIN TOTAL: 0.5 mg/dL (ref 0.3–1.2)
BUN: 13 mg/dL (ref 6–20)
CO2: 26 mmol/L (ref 22–32)
Calcium: 9.6 mg/dL (ref 8.9–10.3)
Chloride: 106 mmol/L (ref 101–111)
Creatinine, Ser: 0.79 mg/dL (ref 0.61–1.24)
GFR calc Af Amer: >60 mL/min (ref >60)
GFR calc non Af Amer: >60 mL/min (ref >60)
GLUCOSE: 194 mg/dL — AB (ref 65–99)
POTASSIUM: 4 mmol/L (ref 3.5–5.1)
SODIUM: 145 mmol/L (ref 135–145)
TOTAL PROTEIN: 6.4 g/dL — AB (ref 6.5–8.1)

## 2015-07-25 LAB — CBC WITH DIFFERENTIAL/PLATELET
BASOS ABS: 0 10*3/uL (ref 0.0–0.1)
BASOS PCT: 0 %
EOS ABS: 0.1 10*3/uL (ref 0.0–0.7)
Eosinophils Relative: 1 %
HEMATOCRIT: 37.3 % — AB (ref 39.0–52.0)
HEMOGLOBIN: 11.2 g/dL — AB (ref 13.0–17.0)
Lymphocytes Relative: 16 %
Lymphs Abs: 1.2 10*3/uL (ref 0.7–4.0)
MCH: 27.8 pg (ref 26.0–34.0)
MCHC: 30 g/dL (ref 30.0–36.0)
MCV: 92.6 fL (ref 78.0–100.0)
MONO ABS: 0.5 10*3/uL (ref 0.1–1.0)
Monocytes Relative: 6 %
NEUTROS ABS: 5.4 10*3/uL (ref 1.7–7.7)
NEUTROS PCT: 77 %
Platelets: 412 10*3/uL — ABNORMAL HIGH (ref 150–400)
RBC: 4.03 MIL/uL — ABNORMAL LOW (ref 4.22–5.81)
RDW: 15.8 % — AB (ref 11.5–15.5)
WBC: 7.2 10*3/uL (ref 4.0–10.5)

## 2015-07-25 LAB — RAPID URINE DRUG SCREEN, HOSP PERFORMED
AMPHETAMINES: NOT DETECTED
BARBITURATES: NOT DETECTED
BENZODIAZEPINES: NOT DETECTED
COCAINE: POSITIVE — AB
OPIATES: NOT DETECTED
TETRAHYDROCANNABINOL: NOT DETECTED

## 2015-07-25 LAB — ETHANOL

## 2015-07-25 MED ORDER — ONDANSETRON HCL 4 MG PO TABS: 4.0000 mg | ORAL_TABLET | Freq: Three times a day (TID) | ORAL | Status: DC | PRN

## 2015-07-25 MED ORDER — CHLORPROMAZINE HCL 25 MG PO TABS
50.0000 mg | ORAL_TABLET | Freq: Once | ORAL | Status: AC
  Administered 2015-07-25: 50 mg via ORAL
  Filled 2015-07-25: qty 2

## 2015-07-25 MED ORDER — NICOTINE 21 MG/24HR TD PT24: 21.0000 mg | MEDICATED_PATCH | Freq: Every day | TRANSDERMAL | Status: DC | PRN

## 2015-07-25 MED ORDER — ALUM & MAG HYDROXIDE-SIMETH 200-200-20 MG/5ML PO SUSP: 30.0000 mL | ORAL | Status: DC | PRN

## 2015-07-25 MED ORDER — ACETAMINOPHEN 325 MG PO TABS
650.0000 mg | ORAL_TABLET | Freq: Once | ORAL | Status: AC
  Administered 2015-07-25: 650 mg via ORAL
  Filled 2015-07-25: qty 2

## 2015-07-25 MED ORDER — LORAZEPAM 1 MG PO TABS
1.0000 mg | ORAL_TABLET | Freq: Three times a day (TID) | ORAL | Status: DC | PRN
  Administered 2015-07-25: 1 mg via ORAL
  Filled 2015-07-25: qty 1

## 2015-07-25 MED ORDER — ACETAMINOPHEN 325 MG PO TABS
650.0000 mg | ORAL_TABLET | ORAL | Status: DC | PRN
  Administered 2015-07-25: 650 mg via ORAL
  Filled 2015-07-25: qty 2

## 2015-07-25 NOTE — ED Notes (Signed)
TTS at bedside. 

## 2015-07-25 NOTE — Discharge Instructions (Signed)
Please read and follow all provided instructions.  Your diagnoses today include:  1. Schizophrenia, paranoid type (HCC)     Tests performed today include:  Vital signs. See below for your results today.   Medications prescribed:   None  Home care instructions:  Follow any educational materials contained in this packet.  Follow-up instructions: Please follow-up with your primary care provider as needed for further evaluation of your symptoms.  Follow-up with your ACT team and with [facility]. Take your Thorazine as prescribed.   Return instructions:   Please return to the Emergency Department if you experience worsening symptoms.   Please return if you have any other emergent concerns.  Additional Information:  Your vital signs today were: BP 138/73 mmHg   Pulse 93   Temp(Src) 98.6 F (37 C) (Oral)   Resp 18   SpO2 99% If your blood pressure (BP) was elevated above 135/85 this visit, please have this repeated by your doctor within one month. ---------------

## 2015-07-25 NOTE — ED Notes (Signed)
Security wanded pt. at triage. 

## 2015-07-25 NOTE — ED Notes (Signed)
Pt yelling and cursing at this RN because he didn't get cake with his meal tray. Explained to patient he had a regular tray ordered and there is no cake on the unit to give him.

## 2015-07-25 NOTE — ED Notes (Signed)
MD at bedside. 

## 2015-07-25 NOTE — ED Notes (Signed)
Patient was given snacks, sprite and milk.

## 2015-07-25 NOTE — ED Notes (Signed)
Staffing office notified for pt.'s sitter . 

## 2015-07-25 NOTE — BHH Counselor (Signed)
BHH Assessment Progress Note  Counselor called [facility] back and left a message after no response for over 4 hours. Pt was re-evaluated by Fransisca Kaufmann, NP and is psychiatrically cleared to be d/c. Pt's RN, Lowella Bandy, advised.   Johny Shock. Ladona Ridgel, MS, NCC, LPCA Counselor

## 2015-07-25 NOTE — ED Notes (Signed)
Pt c/o left great toe pain 10/10.

## 2015-07-25 NOTE — Consult Note (Signed)
Telepsych Psychiatry Consult   Reason for Consult:  Psychiatric evaluation Referring Physician:  EDP Patient Identification: [name] MRN:  [id number] Principal Diagnosis: Chronic paranoid schizophrenia [facility]) Diagnosis:   Patient Active Problem List   Diagnosis Date Noted  . Suicidal ideations [R45.851]   . Substance induced mood disorder ([place]) [F19.94] 03/13/2015  . Acute kidney failure ([place]) [N17.9] 01/26/2015  . Schizophrenia, paranoid type ([place]) [F20.0] 01/17/2015  . Suicidal ideation [R45.851]   . Drug hallucinosis ([place]) [F19.951] 10/08/2014  . Chronic paranoid schizophrenia ([place]) [F20.0] 09/07/2014  . Substance or medication-induced bipolar and related disorder with onset during intoxication ([place]) [F19.94] 08/10/2014  . Acute CHF (congestive heart failure) ([place]) [I50.9] 07/29/2014  . Cocaine use disorder, severe, dependence ([place]) [F14.20]   . Essential hypertension, benign [I10] 03/28/2013  . Diabetes mellitus ([place]) [E11.9] 03/15/2013    Total Time spent with patient: 30 minutes  Subjective:   [name] is a 55 y.o. male patient who states "I just don't want to be on the street. My ACT team has been helping me get my thorazine and I gave them the [facility] for a group home. I'm fine with getting some help from the ACT team and leaving."   HPI:  [name] is a 55 yo [place] American male who presented to [facility] ED voluntarily after becoming agitated due to not being on his thorazine.  His UDS is positive for cocaine. [name] is known to the psychiatric service with multiple visits for similar complaints. Patient appears logical during assessment today. He appeared comfortable due to knowing Provider from past visits. He goes to [facility] for medication management.   Today, he denies suicidal or homicidal ideation, intent or plan. He denies AVH.   Past Psychiatric History: Paranoid schizophrenia, Cocaine use disorder  Risk to Self: Suicidal  Ideation: No-Not Currently/Within Last 6 Months Suicidal Intent: No-Not Currently/Within Last 6 Months Is patient at risk for suicide?: No Suicidal Plan?: No-Not Currently/Within Last 6 Months Specify Current Suicidal Plan: had plan to strangle himself 4 days ago Access to Means: No Specify Access to Suicidal Means: no current plan What has been your use of drugs/alcohol within the last 12 months?: see above How many times?:  (multiple) Other Self Harm Risks: none Triggers for Past Attempts: None known, Unpredictable Intentional Self Injurious Behavior: None Comment - Self Injurious Behavior: pt denies Risk to Others: Homicidal Ideation: No Thoughts of Harm to Others: No Comment - Thoughts of Harm to Others: none Current Homicidal Intent: No Current Homicidal Plan: No Describe Current Homicidal Plan: none Access to Homicidal Means: No Describe Access to Homicidal Means: none Identified Victim: na History of harm to others?: No Assessment of Violence: None Noted Violent Behavior Description: none Does patient have access to weapons?: No Criminal Charges Pending?: Yes Describe Pending Criminal Charges: Cocaine Possession Does patient have a court date: Yes Court Date: 08/01/15 Prior Inpatient Therapy: Prior Inpatient Therapy: Yes Prior Therapy Dates: multiple Prior Therapy Facilty/Provider(s): various Reason for Treatment: schizophrenia, SI/HI Prior Outpatient Therapy: Prior Outpatient Therapy: Yes Prior Therapy Dates:  (sporadically ongoing) Prior Therapy Facilty/Provider(s): [name] Reason for Treatment: med mgmt Does patient have an ACCT team?: Yes ([facility]) Does patient have Intensive In-House Services?  : No Does patient have [name] services? : Yes Does patient have P4CC services?: No  Past Medical History:  Past Medical History  Diagnosis Date  . Diabetes mellitus without complication ([place])   . Hypertension   . Schizophrenia ([place])   . CHF (  congestive heart failure)  ([place])   . Neuropathy ([place])   . Polysubstance abuse   . Cocaine abuse   . Homelessness   . Hepatitis C   . Chronic foot pain   . Homelessness    History reviewed. No pertinent past surgical history. Family History:  Family History  Problem Relation Age of Onset  . Hypertension Other   . Diabetes Other    Family Psychiatric  History: Unknown Social History:  History  Alcohol Use  . Yes    Comment: Daily Drinker      History  Drug Use  . 7.00 per week  . Special: "Crack" cocaine, Cocaine, Marijuana    Comment: Cocaine tonight, Marijuana "a long time"    Social History   Social History  . Marital Status: Divorced    Spouse Name: N/A  . Number of Children: N/A  . Years of Education: N/A   Social History Main Topics  . Smoking status: Current Every Day Smoker -- 1.00 packs/day for 20 years    Types: Cigarettes  . Smokeless tobacco: Current User  . Alcohol Use: Yes     Comment: Daily Drinker   . Drug Use: 7.00 per week    Special: "Crack" cocaine, Cocaine, Marijuana     Comment: Cocaine tonight, Marijuana "a long time"  . Sexual Activity: No   Other Topics Concern  . None   Social History Narrative   ** Merged History Encounter **       Additional Social History:    Pain Medications: See PTA medications Prescriptions: See PTA medications Over the Counter: See PTA medications History of alcohol / drug use?: Yes Longest period of sobriety (when/how long): Unknown Negative Consequences of Use: Financial, Legal, Personal relationships, Work / School Name of Substance 1: Crack cocaine 1 - Age of First Use: 30's 1 - Amount (size/oz): 2 grams 1 - Frequency: Daily 1 - Duration: On-going 1 - Last Use / Amount: yesterday Name of Substance 2: Marijuana 2 - Age of First Use: Teens 2 - Amount (size/oz): one joint at a time 2 - Frequency: unknown 2 - Duration: On going 2 - Last Use / Amount: Unknown Name of Substance 3: ETOH 3 - Age of First Use: Teens 3 -  Amount (size/oz): Varies 3 - Frequency: Often, 4-5 times in a month 3 - Duration: On-going 3 - Last Use / Amount: Unknown          Allergies:   Allergies  Allergen Reactions  . Haldol [Haloperidol] Other (See Comments)    Muscle spasms, loss of voluntary movement. However, pt has taken Thorazine on multiple occasions with no adverse effects.     Labs:  Results for orders placed or performed during the hospital encounter of 07/25/15 (from the past 48 hour(s))  Ethanol     Status: None   Collection Time: 07/25/15  4:20 AM  Result Value Ref Range   Alcohol, Ethyl (B) <5 <5 mg/dL    Comment:        LOWEST DETECTABLE LIMIT FOR SERUM ALCOHOL IS 5 mg/dL FOR MEDICAL PURPOSES ONLY   CBC with Differential     Status: Abnormal   Collection Time: 07/25/15  4:20 AM  Result Value Ref Range   WBC 7.2 4.0 - 10.5 K/uL   RBC 4.03 (L) 4.22 - 5.81 MIL/uL   Hemoglobin 11.2 (L) 13.0 - 17.0 g/dL   HCT 37.3 (L) 39.0 - 52.0 %   MCV 92.6 78.0 - 100.0 fL  MCH 27.8 26.0 - 34.0 pg   MCHC 30.0 30.0 - 36.0 g/dL   RDW 15.8 (H) 11.5 - 15.5 %   Platelets 412 (H) 150 - 400 K/uL   Neutrophils Relative % 77 %   Neutro Abs 5.4 1.7 - 7.7 K/uL   Lymphocytes Relative 16 %   Lymphs Abs 1.2 0.7 - 4.0 K/uL   Monocytes Relative 6 %   Monocytes Absolute 0.5 0.1 - 1.0 K/uL   Eosinophils Relative 1 %   Eosinophils Absolute 0.1 0.0 - 0.7 K/uL   Basophils Relative 0 %   Basophils Absolute 0.0 0.0 - 0.1 K/uL  Comprehensive metabolic panel     Status: Abnormal   Collection Time: 07/25/15  4:20 AM  Result Value Ref Range   Sodium 145 135 - 145 mmol/L   Potassium 4.0 3.5 - 5.1 mmol/L   Chloride 106 101 - 111 mmol/L   CO2 26 22 - 32 mmol/L   Glucose, Bld 194 (H) 65 - 99 mg/dL   BUN 13 6 - 20 mg/dL   Creatinine, Ser 0.79 0.61 - 1.24 mg/dL   Calcium 9.6 8.9 - 10.3 mg/dL   Total Protein 6.4 (L) 6.5 - 8.1 g/dL   Albumin 3.3 (L) 3.5 - 5.0 g/dL   AST 18 15 - 41 U/L   ALT 15 (L) 17 - 63 U/L   Alkaline Phosphatase  104 38 - 126 U/L   Total Bilirubin 0.5 0.3 - 1.2 mg/dL   GFR calc non Af Amer >60 >60 mL/min   GFR calc Af Amer >60 >60 mL/min    Comment: (NOTE) The eGFR has been calculated using the CKD EPI equation. This calculation has not been validated in all clinical situations. eGFR's persistently <60 mL/min signify possible Chronic Kidney Disease.    Anion gap 13 5 - 15  Urine rapid drug screen (hosp performed)     Status: Abnormal   Collection Time: 07/25/15  4:22 AM  Result Value Ref Range   Opiates NONE DETECTED NONE DETECTED   Cocaine POSITIVE (A) NONE DETECTED   Benzodiazepines NONE DETECTED NONE DETECTED   Amphetamines NONE DETECTED NONE DETECTED   Tetrahydrocannabinol NONE DETECTED NONE DETECTED   Barbiturates NONE DETECTED NONE DETECTED    Comment:        DRUG SCREEN FOR MEDICAL PURPOSES ONLY.  IF CONFIRMATION IS NEEDED FOR ANY PURPOSE, NOTIFY LAB WITHIN 5 DAYS.        LOWEST DETECTABLE LIMITS FOR URINE DRUG SCREEN Drug Class       Cutoff (ng/mL) Amphetamine      1000 Barbiturate      200 Benzodiazepine   160 Tricyclics       737 Opiates          300 Cocaine          300 THC              50     Current Facility-Administered Medications  Medication Dose Route Frequency Provider Last Rate Last Dose  . acetaminophen (TYLENOL) tablet 650 mg  650 mg Oral Q4H PRN Carlisle Cater, PA-C   650 mg at 07/25/15 1418  . alum & mag hydroxide-simeth (MAALOX/MYLANTA) 200-200-20 MG/5ML suspension 30 mL  30 mL Oral PRN Carlisle Cater, PA-C      . LORazepam (ATIVAN) tablet 1 mg  1 mg Oral Q8H PRN Carlisle Cater, PA-C   1 mg at 07/25/15 1418  . nicotine (NICODERM CQ - dosed in mg/24 hours) patch 21 mg  21 mg Transdermal Daily PRN Carlisle Cater, PA-C      . ondansetron [facility]) tablet 4 mg  4 mg Oral Q8H PRN Carlisle Cater, PA-C       Current Outpatient Prescriptions  Medication Sig Dispense Refill  . amantadine (SYMMETREL) 100 MG capsule Take 1 capsule (100 mg total) by mouth 2 (two) times  daily. 60 capsule 0  . chlorproMAZINE (THORAZINE) 50 MG tablet Take 1 tablet (50 mg total) by mouth daily. 30 tablet 0  . amantadine (SYMMETREL) 100 MG capsule Take 1 capsule (100 mg total) by mouth 2 (two) times daily. (Patient not taking: Reported on 07/25/2015) 60 capsule 0    Musculoskeletal: Strength & Muscle Tone: within normal limits Gait & Station: normal Patient leans: N/A  Psychiatric Specialty Exam: Review of Systems  Constitutional: Negative.   HENT: Negative.   Eyes: Negative.   Respiratory: Positive for cough.        Sore throat  Cardiovascular: Negative.   Gastrointestinal: Negative.   Genitourinary: Negative.   Musculoskeletal: Positive for joint pain.  Skin: Negative.   Neurological: Negative.   Endo/Heme/Allergies: Negative.   Psychiatric/Behavioral: Positive for substance abuse.    Blood pressure 138/73, pulse 93, temperature 98.6 F (37 C), temperature source Oral, resp. rate 18, SpO2 99 %.There is no weight on file to calculate BMI.  General Appearance: Fairly Groomed  Engineer, water::  Fair  Speech:  Clear and Coherent  Volume:  Increased  Mood:  Anxious  Affect:  Congruent  Thought Process:  Coherent  Orientation:  Full (Time, Place, and Person)  Thought Content:  No psychosis, no hallucinations, no delusions  Suicidal Thoughts:  No  Homicidal Thoughts:  No  Memory:  Immediate;   Fair Recent;   Fair Remote;   Fair  Judgement:  Poor  Insight:  Shallow  Psychomotor Activity:  Normal  Concentration:  Fair  Recall:  [place]: Fair  Akathisia:  No  Handed:  Right  AIMS (if indicated):     Assets:  Communication Skills Desire for Improvement Resilience  ADL's:  Intact  Cognition: WNL  Sleep:       Disposition: No evidence of imminent risk to self or others at present.   Patient does not meet criteria for psychiatric inpatient admission. Supportive therapy provided about ongoing stressors.   Discharge home with  follow up with the [facility] ACT team. Currently denying any suicidal or homicidal ideation.   Elmarie Shiley, NP-C 07/25/2015 2:50 PM

## 2015-07-25 NOTE — BH Assessment (Addendum)
Tele Assessment Note   [name] is an 55 y.o. male who voluntarily presents to [facility]. Pt's explanation for presenting to the ED was unclear as pt's speech was very tangential and pressured. Pt discussed his life struggles, including being in the [employer], being addicted to crack, and being in jail for several years. Pt reported several times that he just needed 72 hours at [facility] to "get back on my meds". Pt shared he had an ACTT team with [facility]) and encouraged counselor to call them. He reported that [facility] had his rx from his recent (2 days ago) release from [facility], as well as his FL2, so that he could get into a group home. Pt shared that, upon his last release, [facility] picked him up and he gave them the above stated items and went back to the street. Pt admitted that the prescriptions that he has is for his meds that he needs to get back on. Pt also indicated that he didn't have to go to [facility], but could go to [facility].   Pt denied having current SI, instead indicating that "I've been sick since 1986". Pt denied HI. Pt said that he had a hx of AVH, but denied being affected by them currently. He did not appear to be responding to internal stimuli. Pt mentioned, several times, that people were trying to kill him for insurance money. He also shared that he owed drug money on the streets. He was unable to identify who these people trying to kill him were.   Diagnosis: Cocaine Use Disorder, Severe; Schizophrenia  Past Medical History:  Past Medical History  Diagnosis Date  . Diabetes mellitus without complication (HCC)   . Hypertension   . Schizophrenia (HCC)   . CHF (congestive heart failure) (HCC)   . Neuropathy (HCC)   . Polysubstance abuse   . Cocaine abuse   . Homelessness   . Hepatitis C   . Chronic foot pain   . Homelessness     History reviewed. No pertinent past surgical history.  Family History:  Family History  Problem Relation Age of Onset  . Hypertension Other    . Diabetes Other     Social History:  reports that he has been smoking Cigarettes.  He has a 20 pack-year smoking history. He uses smokeless tobacco. He reports that he drinks alcohol. He reports that he uses illicit drugs ("Crack" cocaine, Cocaine, and Marijuana) about 7 times per week.  Additional Social History:  Alcohol / Drug Use Pain Medications: See PTA medications Prescriptions: See PTA medications Over the Counter: See PTA medications History of alcohol / drug use?: Yes Longest period of sobriety (when/how long): Unknown Negative Consequences of Use: Financial, Legal, Personal relationships, Work / School Substance #1 Name of Substance 1: Crack cocaine 1 - Age of First Use: 30's 1 - Amount (size/oz): 2 grams 1 - Frequency: Daily 1 - Duration: On-going 1 - Last Use / Amount: yesterday Substance #2 Name of Substance 2: Marijuana 2 - Age of First Use: Teens 2 - Amount (size/oz): one joint at a time 2 - Frequency: unknown 2 - Duration: On going 2 - Last Use / Amount: Unknown Substance #3 Name of Substance 3: ETOH 3 - Age of First Use: Teens 3 - Amount (size/oz): Varies 3 - Frequency: Often, 4-5 times in a month 3 - Duration: On-going 3 - Last Use / Amount: Unknown  CIWA: CIWA-Ar BP: 168/91 mmHg Pulse Rate: 98 COWS:  PATIENT STRENGTHS: (choose at least two) Active sense of humor Average or above average intelligence Capable of independent living Supportive family/friends  Allergies:  Allergies  Allergen Reactions  . Haldol [Haloperidol] Other (See Comments)    Muscle spasms, loss of voluntary movement. However, pt has taken Thorazine on multiple occasions with no adverse effects.     Home Medications:  (Not in a hospital admission)  OB/GYN Status:  No LMP for male patient.  General Assessment Data Location of Assessment: [facility] ED TTS Assessment: In system Is this a Tele or Face-to-Face Assessment?: Tele Assessment Is this an Initial Assessment or a  Re-assessment for this encounter?: Initial Assessment Marital status: Separated Is patient pregnant?: No Pregnancy Status: No Living Arrangements: Other (Comment) (rents out a house from cousin) Can pt return to current living arrangement?: Yes Admission Status: Voluntary Is patient capable of signing voluntary admission?: Yes Referral Source: Self/Family/Friend Insurance type: Medicare  Medical Screening Exam [facility] Walk-in ONLY) Medical Exam completed: Yes  Crisis Care Plan Living Arrangements: Other (Comment) (rents out a house from cousin) Name of Psychiatrist: [facility] Name of Therapist: [facility]  Education Status Is patient currently in school?: No Highest grade of school patient has completed: 12th grade Name of school: NA Contact person: NA  Risk to self with the past 6 months Suicidal Ideation: No-Not Currently/Within Last 6 Months Has patient been a risk to self within the past 6 months prior to admission? : Yes Suicidal Intent: No-Not Currently/Within Last 6 Months Has patient had any suicidal intent within the past 6 months prior to admission? : Yes Is patient at risk for suicide?: No Suicidal Plan?: No-Not Currently/Within Last 6 Months Has patient had any suicidal plan within the past 6 months prior to admission? : Yes Specify Current Suicidal Plan: had plan to strangle himself 4 days ago Access to Means: No Specify Access to Suicidal Means: no current plan What has been your use of drugs/alcohol within the last 12 months?: see above Previous Attempts/Gestures: Yes How many times?:  (multiple) Other Self Harm Risks: none Triggers for Past Attempts: None known, Unpredictable Intentional Self Injurious Behavior: None Comment - Self Injurious Behavior: pt denies Family Suicide History: No Recent stressful life event(s): Financial Problems, Legal Issues, Recent negative physical changes Persecutory voices/beliefs?: No Depression: No Depression Symptoms: Feeling  angry/irritable Substance abuse history and/or treatment for substance abuse?: Yes Suicide prevention information given to non-admitted patients: Not applicable  Risk to Others within the past 6 months Homicidal Ideation: No Does patient have any lifetime risk of violence toward others beyond the six months prior to admission? : No Thoughts of Harm to Others: No Comment - Thoughts of Harm to Others: none Current Homicidal Intent: No Current Homicidal Plan: No Describe Current Homicidal Plan: none Access to Homicidal Means: No Describe Access to Homicidal Means: none Identified Victim: na History of harm to others?: No Assessment of Violence: None Noted Violent Behavior Description: none Does patient have access to weapons?: No Criminal Charges Pending?: Yes Describe Pending Criminal Charges: Cocaine Possession Does patient have a court date: Yes Court Date: 08/01/15 Is patient on probation?: No  Psychosis Hallucinations: None noted Delusions: Persecutory  Mental Status Report Appearance/Hygiene: In scrubs, Unremarkable Eye Contact: Good Motor Activity: Hyperactivity, Unremarkable Speech: Logical/coherent, Tangential Level of Consciousness: Alert Mood: Anxious Affect: Anxious Anxiety Level: Moderate Thought Processes: Tangential Judgement: Partial Orientation: Person, Place, Time, Situation, Appropriate for developmental age Obsessive Compulsive Thoughts/Behaviors: None  Cognitive Functioning Concentration: Decreased Memory: Unable to Assess IQ: Average  Insight: Poor Impulse Control: Fair Appetite: Good Weight Loss: 0 Weight Gain: 0 Sleep: No Change Total Hours of Sleep: 6 Vegetative Symptoms: None  ADLScreening [facility] Assessment Services) Patient's cognitive ability adequate to safely complete daily activities?: Yes Patient able to express need for assistance with ADLs?: Yes  Prior Inpatient Therapy Prior Inpatient Therapy: Yes Prior Therapy Dates:  multiple Prior Therapy Facilty/Provider(s): various Reason for Treatment: schizophrenia, SI/HI  Prior Outpatient Therapy Prior Outpatient Therapy: Yes Prior Therapy Dates:  (sporadically ongoing) Prior Therapy Facilty/Provider(s): [name] Reason for Treatment: med mgmt Does patient have an ACCT team?: Yes ([facility]) Does patient have Intensive In-House Services?  : No Does patient have [name] services? : Yes Does patient have P4CC services?: No  ADL Screening (condition at time of admission) Patient's cognitive ability adequate to safely complete daily activities?: Yes Is the patient deaf or have difficulty hearing?: No Does the patient have difficulty seeing, even when wearing glasses/contacts?: No Does the patient have difficulty concentrating, remembering, or making decisions?: No Patient able to express need for assistance with ADLs?: Yes Does the patient have difficulty dressing or bathing?: No Does the patient have difficulty walking or climbing stairs?: No Weakness of Legs: None Weakness of Arms/Hands: None  Home Assistive Devices/Equipment Home Assistive Devices/Equipment: None  Therapy Consults (therapy consults require a physician order) PT Evaluation Needed: No OT Evalulation Needed: No SLP Evaluation Needed: No Abuse/Neglect Assessment (Assessment to be complete while patient is alone) Physical Abuse: Denies Verbal Abuse: Denies Sexual Abuse: Denies Exploitation of patient/patient's resources: Denies Self-Neglect: Denies Values / Beliefs Cultural Requests During Hospitalization: None Spiritual Requests During Hospitalization: None Consults Spiritual Care Consult Needed: No Social Work Consult Needed: No Merchant navy officer (For Healthcare) Does patient have an advance directive?: No Would patient like information on creating an advanced directive?: No - patient declined information    Additional Information 1:1 In Past 12 Months?: No CIRT Risk: No Elopement  Risk: No Does patient have medical clearance?: Yes     Disposition:  Disposition Initial Assessment Completed for this Encounter: Yes Disposition of Patient: Other dispositions (case to be evaluated by psychiatry)  Laddie Aquas 07/25/2015 8:06 AM

## 2015-07-25 NOTE — ED Notes (Signed)
Patient is resting comfortably. After eating breakfast , no complaints

## 2015-07-25 NOTE — Progress Notes (Addendum)
Writer spoke with [employer] Act Team member [phone]) and informed that pt's is for d/c.  Pt's ACT Team case worker, Ree Kida will call pt's [facility] RN to get in touch with pt and arrange transportation, phone # provided. [facility] RN aware and to advise patient of ACT Team arrival.  Melbourne Abts, [name] Disposition staff 07/25/2015 3:54 PM

## 2015-07-25 NOTE — ED Notes (Signed)
Purple scrubs given to pt. at triage .

## 2015-07-25 NOTE — [facility] Counselor (Signed)
[facility] Assessment Progress Note  Counselor called [facility] [phone]) and spoke to [place]. Counselor advised Ree Kida of pt's most recent visit to the ED and inquired to a plan that [facility] might have in place for pt. Ree Kida indicated that he will call counselor back after meeting with the team in @ 30 min-1hour.   Johny Shock. Ladona Ridgel, MS, NCC, LPCA Counselor

## 2015-07-25 NOTE — ED Provider Notes (Signed)
CSN: [id number]     Arrival date & time 07/25/15  0349 History   First MD Initiated Contact with Patient 07/25/15 [phone]     Chief Complaint  Patient presents with  . Suicidal     (Consider location/radiation/quality/duration/timing/severity/associated sxs/prior Treatment) HPI Comments: Patients with history of polysubstance abuse, schizophrenia, multiple ED visits and psych evaluations -- presents with complaint of worsening visual and auditory hallucinations. Patient states that he was hearing and seeing demons. He states that he wants to hurt himself but denies plan. He admits to using crack cocaine. Patient has no acute medical complaints other than swelling and pain in his bilateral feet, which is typical for his lower extremity neuropathy. Patient was recently evaluated by psychiatry. Patient was placed on 50 mg of Thorazine daily. Patient states that he has yet to get this medication filled and has not had it in several days.. The onset of this condition was acute. The course is constant. Aggravating factors: none. Alleviating factors: none.    The history is provided by the patient.    Past Medical History  Diagnosis Date  . Diabetes mellitus without complication (HCC)   . Hypertension   . Schizophrenia (HCC)   . CHF (congestive heart failure) (HCC)   . Neuropathy (HCC)   . Polysubstance abuse   . Cocaine abuse   . Homelessness   . Hepatitis C   . Chronic foot pain   . Homelessness    History reviewed. No pertinent past surgical history. Family History  Problem Relation Age of Onset  . Hypertension Other   . Diabetes Other    Social History  Substance Use Topics  . Smoking status: Current Every Day Smoker -- 1.00 packs/day for 20 years    Types: Cigarettes  . Smokeless tobacco: Current User  . Alcohol Use: Yes     Comment: Daily Drinker     Review of Systems  Constitutional: Negative for fever.  HENT: Negative for rhinorrhea and sore throat.   Eyes: Negative for  redness.  Respiratory: Negative for cough.   Cardiovascular: Positive for leg swelling. Negative for chest pain.  Gastrointestinal: Negative for nausea, vomiting, abdominal pain and diarrhea.  Genitourinary: Negative for dysuria.  Musculoskeletal: Positive for myalgias.  Skin: Negative for rash.  Neurological: Negative for headaches.  Psychiatric/Behavioral: Positive for suicidal ideas and hallucinations. Negative for self-injury.      Allergies  Haldol  Home Medications   Prior to Admission medications   Medication Sig Start Date End Date Taking? Authorizing Provider  amantadine (SYMMETREL) 100 MG capsule Take 1 capsule (100 mg total) by mouth 2 (two) times daily. 06/07/15   Earney Navy, NP  amantadine (SYMMETREL) 100 MG capsule Take 1 capsule (100 mg total) by mouth 2 (two) times daily. 07/22/15   Kristeen Mans, NP  chlorproMAZINE (THORAZINE) 50 MG tablet Take 1 tablet (50 mg total) by mouth daily. 07/22/15   Kristeen Mans, NP   BP 168/91 mmHg  Pulse 98  Temp(Src) 98.1 F (36.7 C) (Oral)  Resp 14  SpO2 97% Physical Exam  Constitutional: He appears well-developed and well-nourished.  HENT:  Head: Normocephalic and atraumatic.  Eyes: Conjunctivae are normal. Right eye exhibits no discharge. Left eye exhibits no discharge.  Neck: Normal range of motion. Neck supple.  Cardiovascular: Normal rate, regular rhythm and normal heart sounds.   Pulmonary/Chest: Effort normal and breath sounds normal.  Abdominal: Soft. There is no tenderness.  Neurological: He is alert.  Skin: Skin is warm  and dry.  Psychiatric: He is actively hallucinating. Thought content is paranoid. He exhibits a depressed mood. He expresses suicidal ideation. He expresses no homicidal ideation. He expresses no suicidal plans and no homicidal plans.  Nursing note and vitals reviewed.   ED Course  Procedures (including critical care time) Labs Review Labs Reviewed  URINE RAPID DRUG SCREEN, HOSP PERFORMED  - Abnormal; Notable for the following:    Cocaine POSITIVE (*)    All other components within normal limits  CBC WITH DIFFERENTIAL/PLATELET - Abnormal; Notable for the following:    RBC 4.03 (*)    Hemoglobin 11.2 (*)    HCT 37.3 (*)    RDW 15.8 (*)    Platelets 412 (*)    All other components within normal limits  COMPREHENSIVE METABOLIC PANEL - Abnormal; Notable for the following:    Glucose, Bld 194 (*)    Total Protein 6.4 (*)    Albumin 3.3 (*)    ALT 15 (*)    All other components within normal limits  ETHANOL    Imaging Review No results found. I have personally reviewed and evaluated these images and lab results as part of my medical decision-making.   EKG Interpretation None       Patient seen and examined. Holding orders completed.   Vital signs reviewed and are as follows: BP 168/91 mmHg  Pulse 98  Temp(Src) 98.1 F (36.7 C) (Oral)  Resp 14  SpO2 97%  3:02 PM Pt seen by psych NP. Cleared for discharge. Agree with plan.   MDM   Final diagnoses:  Schizophrenia, paranoid type [facility])   Patient with baseline schizophrenia. Plan for care already in place. Psych deems patient is not a current harm to self or others. Discharge to continue with plan in place.    Renne Crigler, PA-C 07/25/15 1503  Laurence Spates, MD 07/25/15 [phone]

## 2015-07-25 NOTE — ED Notes (Signed)
Pt. arrived with EMS from [facility] , reports suicidal ideation - did not disclose plan of suicide , auditory /visual hallucinations , pt. added bilateral knee pain and  bilateral foot pain :" I've been walking all day" , pt. also stated smoking " crack" all day .

## 2015-07-25 NOTE — ED Notes (Signed)
This RN called the number provided by [place] but the person who answered stated he has asked multiple times to have his number removed. He provided another number for the ACT team [phone]. No answer. Will try again shortly.

## 2015-07-25 NOTE — ED Notes (Signed)
Pt reports he does not know how to get in touch with his ACT team member. This RN spoke with [place], the Child psychotherapist from [employer]. Number provided and given to patient. [phone]

## 2015-07-25 NOTE — ED Notes (Signed)
This RN has not been able to contact his ACT team member. Pt requesting bus pass and to wait in the waiting room. Pt A&OX4, ambulatory to lobby with steady gait,, NAD. Buss pass given to pt.

## 2015-07-28 ENCOUNTER — Emergency Department (HOSPITAL_COMMUNITY): Payer: Medicare Other

## 2015-07-28 ENCOUNTER — Emergency Department (HOSPITAL_COMMUNITY)
Admission: EM | Admit: 2015-07-28 | Discharge: 2015-07-29 | Disposition: A | Discharge status: Home / Self Care | Payer: Medicare Other | Attending: Emergency Medicine | Admitting: Emergency Medicine

## 2015-07-28 ENCOUNTER — Encounter (HOSPITAL_COMMUNITY): Payer: Self-pay | Admitting: Emergency Medicine

## 2015-07-28 DIAGNOSIS — Z8619 Personal history of other infectious and parasitic diseases: Secondary | ICD-10-CM | POA: Insufficient documentation

## 2015-07-28 DIAGNOSIS — R45851 Suicidal ideations: Secondary | ICD-10-CM | POA: Insufficient documentation

## 2015-07-28 DIAGNOSIS — R0602 Shortness of breath: Secondary | ICD-10-CM | POA: Insufficient documentation

## 2015-07-28 DIAGNOSIS — E119 Type 2 diabetes mellitus without complications: Secondary | ICD-10-CM | POA: Insufficient documentation

## 2015-07-28 DIAGNOSIS — G8929 Other chronic pain: Secondary | ICD-10-CM | POA: Insufficient documentation

## 2015-07-28 DIAGNOSIS — Z590 Homelessness: Secondary | ICD-10-CM | POA: Insufficient documentation

## 2015-07-28 DIAGNOSIS — I509 Heart failure, unspecified: Secondary | ICD-10-CM | POA: Insufficient documentation

## 2015-07-28 DIAGNOSIS — F419 Anxiety disorder, unspecified: Secondary | ICD-10-CM | POA: Insufficient documentation

## 2015-07-28 DIAGNOSIS — F17210 Nicotine dependence, cigarettes, uncomplicated: Secondary | ICD-10-CM | POA: Insufficient documentation

## 2015-07-28 DIAGNOSIS — I10 Essential (primary) hypertension: Secondary | ICD-10-CM | POA: Insufficient documentation

## 2015-07-28 DIAGNOSIS — F1410 Cocaine abuse, uncomplicated: Secondary | ICD-10-CM | POA: Insufficient documentation

## 2015-07-28 LAB — COMPREHENSIVE METABOLIC PANEL
ALT: 15 U/L — AB (ref 17–63)
ANION GAP: 10 (ref 5–15)
AST: 21 U/L (ref 15–41)
Albumin: 3.7 g/dL (ref 3.5–5.0)
Alkaline Phosphatase: 100 U/L (ref 38–126)
BUN: 15 mg/dL (ref 6–20)
CHLORIDE: 109 mmol/L (ref 101–111)
CO2: 27 mmol/L (ref 22–32)
Calcium: 9.6 mg/dL (ref 8.9–10.3)
Creatinine, Ser: 0.82 mg/dL (ref 0.61–1.24)
Glucose, Bld: 187 mg/dL — ABNORMAL HIGH (ref 65–99)
POTASSIUM: 3.8 mmol/L (ref 3.5–5.1)
SODIUM: 146 mmol/L — AB (ref 135–145)
Total Bilirubin: 0.3 mg/dL (ref 0.3–1.2)
Total Protein: 7 g/dL (ref 6.5–8.1)

## 2015-07-28 LAB — CBC WITH DIFFERENTIAL/PLATELET
Basophils Absolute: 0 10*3/uL (ref 0.0–0.1)
Basophils Relative: 0 %
EOS ABS: 0.1 10*3/uL (ref 0.0–0.7)
EOS PCT: 1 %
HCT: 38.6 % — ABNORMAL LOW (ref 39.0–52.0)
Hemoglobin: 12 g/dL — ABNORMAL LOW (ref 13.0–17.0)
LYMPHS ABS: 1.3 10*3/uL (ref 0.7–4.0)
Lymphocytes Relative: 19 %
MCH: 28.7 pg (ref 26.0–34.0)
MCHC: 31.1 g/dL (ref 30.0–36.0)
MCV: 92.3 fL (ref 78.0–100.0)
MONO ABS: 0.4 10*3/uL (ref 0.1–1.0)
Monocytes Relative: 7 %
Neutro Abs: 5 10*3/uL (ref 1.7–7.7)
Neutrophils Relative %: 73 %
PLATELETS: 311 10*3/uL (ref 150–400)
RBC: 4.18 MIL/uL — AB (ref 4.22–5.81)
RDW: 15.5 % (ref 11.5–15.5)
WBC: 6.8 10*3/uL (ref 4.0–10.5)

## 2015-07-28 LAB — I-STAT TROPONIN, ED: TROPONIN I, POC: 0.03 ng/mL (ref 0.00–0.08)

## 2015-07-28 LAB — BRAIN NATRIURETIC PEPTIDE: B NATRIURETIC PEPTIDE 5: 219.4 pg/mL — AB (ref 0.0–100.0)

## 2015-07-28 MED ORDER — ACETAMINOPHEN 325 MG PO TABS
650.0000 mg | ORAL_TABLET | Freq: Once | ORAL | Status: AC
  Administered 2015-07-28: 650 mg via ORAL
  Filled 2015-07-28: qty 2

## 2015-07-28 MED ORDER — LORAZEPAM 2 MG/ML IJ SOLN
1.0000 mg | Freq: Once | INTRAMUSCULAR | Status: AC
  Administered 2015-07-28: 1 mg via INTRAVENOUS
  Filled 2015-07-28: qty 1

## 2015-07-28 NOTE — ED Notes (Addendum)
Pt homeless, c/o respiratory distress and "smoking crack all day." Pt also c/o SI. A&Ox4. Denies chest pain. Noncompliant with medications. Pt asked if he is taking all his medication and responds "Shit man, you know all I take is crack."

## 2015-07-29 ENCOUNTER — Emergency Department (HOSPITAL_COMMUNITY): Payer: Medicare Other

## 2015-07-29 LAB — URINALYSIS, ROUTINE W REFLEX MICROSCOPIC
Bilirubin Urine: NEGATIVE
GLUCOSE, UA: 100 mg/dL — AB
Hgb urine dipstick: NEGATIVE
KETONES UR: NEGATIVE mg/dL
LEUKOCYTES UA: NEGATIVE
NITRITE: NEGATIVE
PROTEIN: 30 mg/dL — AB
Specific Gravity, Urine: 1.026 (ref 1.005–1.030)
pH: 6 (ref 5.0–8.0)

## 2015-07-29 LAB — URINE MICROSCOPIC-ADD ON
RBC / HPF: NONE SEEN RBC/hpf (ref 0–5)
Squamous Epithelial / LPF: NONE SEEN

## 2015-07-29 LAB — RAPID URINE DRUG SCREEN, HOSP PERFORMED
Amphetamines: NOT DETECTED
BARBITURATES: NOT DETECTED
Benzodiazepines: NOT DETECTED
COCAINE: POSITIVE — AB
Opiates: NOT DETECTED
TETRAHYDROCANNABINOL: NOT DETECTED

## 2015-07-29 LAB — I-STAT TROPONIN, ED
TROPONIN I, POC: 0.04 ng/mL (ref 0.00–0.08)
TROPONIN I, POC: 0.04 ng/mL (ref 0.00–0.08)

## 2015-07-29 LAB — ETHANOL

## 2015-07-29 NOTE — ED Notes (Signed)
Patient given graham crackers, peanut butter, and orange juice per request. Patient eating in room.

## 2015-07-29 NOTE — Discharge Instructions (Signed)
Stimulant Use Disorder-Cocaine  Cocaine is one of a group of powerful drugs called stimulants. Cocaine has medical uses for stopping nosebleeds and for pain control before minor nose or dental surgery. However, cocaine is misused because of the effects that it produces. These effects include:   · A feeling of extreme pleasure.  · Alertness.  · High energy.  Common street names for cocaine include coke, crack, blow, snow, and nose candy. Cocaine is snorted, dissolved in water and injected, or smoked.   Stimulants are addictive because they activate regions of the brain that produce both the pleasurable sensation of "reward" and psychological dependence. Together, these actions account for loss of control and the rapid development of drug dependence. This means you become ill without the drug (withdrawal) and need to keep using it to function.   Stimulant use disorder is use of stimulants that disrupts your daily life. It disrupts relationships with family and friends and how you do your job. Cocaine increases your blood pressure and heart rate. It can cause a heart attack or stroke. Cocaine can also cause death from irregular heart rate or seizures.  SYMPTOMS  Symptoms of stimulant use disorder with cocaine include:  · Use of cocaine in larger amounts or over a longer period of time than intended.  · Unsuccessful attempts to cut down or control cocaine use.  · A lot of time spent obtaining, using, or recovering from the effects of cocaine.  · A strong desire or urge to use cocaine (craving).  · Continued use of cocaine in spite of major problems at work, school, or home because of use.  · Continued use of cocaine in spite of relationship problems because of use.  · Giving up or cutting down on important life activities because of cocaine use.  · Use of cocaine over and over in situations when it is physically hazardous, such as driving a car.  · Continued use of cocaine in spite of a physical problem that is likely  related to use. Physical problems can include:  ¨ Malnutrition.  ¨ Nosebleeds.  ¨ Chest pain.  ¨ High blood pressure.  ¨ A hole that develops between the part of your nose that separates your nostrils (perforated nasal septum).  ¨ Lung and kidney damage.  · Continued use of cocaine in spite of a mental problem that is likely related to use. Mental problems can include:  ¨ Schizophrenia-like symptoms.  ¨ Depression.  ¨ Bipolar mood swings.  ¨ Anxiety.  ¨ Sleep problems.  · Need to use more and more cocaine to get the same effect, or lessened effect over time with use of the same amount of cocaine (tolerance).  · Having withdrawal symptoms when cocaine use is stopped, or using cocaine to reduce or avoid withdrawal symptoms. Withdrawal symptoms include:  ¨ Depressed or irritable mood.  ¨ Low energy or restlessness.  ¨ Bad dreams.  ¨ Poor or excessive sleep.  ¨ Increased appetite.  DIAGNOSIS  Stimulant use disorder is diagnosed by your health care provider. You may be asked questions about your cocaine use and how it affects your life. A physical exam may be done. A drug screen may be ordered. You may be referred to a mental health professional. The diagnosis of stimulant use disorder requires at least two symptoms within 12 months. The type of stimulant use disorder depends on the number of signs and symptoms you have. The type may be:  · Mild. Two or three signs and symptoms.  ·   Moderate. Four or five signs and symptoms.  · Severe. Six or more signs and symptoms.  TREATMENT  Treatment for stimulant use disorder is usually provided by mental health professionals with training in substance use disorders. The following options are available:  · Counseling or talk therapy. Talk therapy addresses the reasons you use cocaine and ways to keep you from using again. Goals of talk therapy include:  ¨ Identifying and avoiding triggers for use.  ¨ Handling cravings.  ¨ Replacing use with healthy activities.  · Support groups.  Support groups provide emotional support, advice, and guidance.  · Medicine. Certain medicines may decrease cocaine cravings or withdrawal symptoms.  HOME CARE INSTRUCTIONS  · Take medicines only as directed by your health care provider.  · Identify the people and activities that trigger your cocaine use and avoid them.  · Keep all follow-up visits as directed by your health care provider.  SEEK MEDICAL CARE IF:  · Your symptoms get worse or you relapse.  · You are not able to take medicines as directed.  SEEK IMMEDIATE MEDICAL CARE IF:  · You have serious thoughts about hurting yourself or others.  · You have a seizure, chest pain, sudden weakness, or loss of speech or vision.  FOR MORE INFORMATION  · National Institute on Drug Abuse: www.drugabuse.gov  · Substance Abuse and Mental Health Services Administration: www.samhsa.gov     This information is not intended to replace advice given to you by your health care provider. Make sure you discuss any questions you have with your health care provider.     Document Released: 06/14/2000 Document Revised: 07/08/2014 Document Reviewed: 06/30/2013  Elsevier Interactive Patient Education ©2016 [employer]

## 2015-07-29 NOTE — ED Notes (Signed)
Discharge instructions and follow up care reviewed with patient. Patient verbalized understanding. 

## 2015-07-29 NOTE — ED Notes (Signed)
Patient given [place] sandwich, sprite, and graham crackers. Patient eating in room.

## 2015-07-29 NOTE — ED Notes (Signed)
Patient resting with eyes closed

## 2015-07-29 NOTE — ED Notes (Signed)
Patient transported to CT 

## 2015-07-29 NOTE — ED Provider Notes (Signed)
CSN: [id number]     Arrival date & time 07/28/15  2151 History   First MD Initiated Contact with Patient 07/28/15 2206     Chief Complaint  Patient presents with  . Shortness of Breath  . Suicidal     (Consider location/radiation/quality/duration/timing/severity/associated sxs/prior Treatment) HPI Comments: [facility] ACT team hasnt seen him, pt frustrated by this  Patient is a 55 y.o. male presenting with shortness of breath.  Shortness of Breath Severity:  Severe Timing:  Constant Progression:  Unchanged Chronicity:  New Context comment:  Using crack cocaine Relieved by:  Nothing Worsened by:  Nothing tried Associated symptoms: no abdominal pain, no chest pain, no cough, no fever, no headaches, no rash, no sore throat, no syncope and no vomiting     Past Medical History  Diagnosis Date  . Diabetes mellitus without complication (HCC)   . Hypertension   . Schizophrenia (HCC)   . CHF (congestive heart failure) (HCC)   . Neuropathy (HCC)   . Polysubstance abuse   . Cocaine abuse   . Homelessness   . Hepatitis C   . Chronic foot pain   . Homelessness    History reviewed. No pertinent past surgical history. Family History  Problem Relation Age of Onset  . Hypertension Other   . Diabetes Other    Social History  Substance Use Topics  . Smoking status: Current Every Day Smoker -- 1.00 packs/day for 20 years    Types: Cigarettes  . Smokeless tobacco: Current User  . Alcohol Use: Yes     Comment: Daily Drinker     Review of Systems  Constitutional: Negative for fever.  HENT: Negative for sore throat.   Eyes: Negative for visual disturbance.  Respiratory: Positive for shortness of breath. Negative for cough.   Cardiovascular: Negative for chest pain and syncope.  Gastrointestinal: Negative for nausea, vomiting, abdominal pain and diarrhea.  Genitourinary: Negative for difficulty urinating.  Musculoskeletal: Negative for back pain and neck stiffness.  Skin: Negative for  rash.  Neurological: Negative for syncope and headaches.  Psychiatric/Behavioral: Positive for suicidal ideas. The patient is nervous/anxious.       Allergies  Haldol  Home Medications   Prior to Admission medications   Medication Sig Start Date End Date Taking? Authorizing Provider  amantadine (SYMMETREL) 100 MG capsule Take 1 capsule (100 mg total) by mouth 2 (two) times daily. Patient not taking: Reported on 07/25/2015 06/07/15   Earney Navy, NP  amantadine (SYMMETREL) 100 MG capsule Take 1 capsule (100 mg total) by mouth 2 (two) times daily. Patient not taking: Reported on 07/28/2015 07/22/15   Kristeen Mans, NP  chlorproMAZINE (THORAZINE) 50 MG tablet Take 1 tablet (50 mg total) by mouth daily. Patient not taking: Reported on 07/28/2015 07/22/15   Kristeen Mans, NP   BP 133/75 mmHg  Pulse 102  Temp(Src) 98.1 F (36.7 C) (Oral)  Resp 19  SpO2 90% Physical Exam  Constitutional: He is oriented to person, place, and time. He appears well-developed and well-nourished. No distress.  HENT:  Head: Normocephalic and atraumatic.  Eyes: Conjunctivae and EOM are normal.  Neck: Normal range of motion.  Cardiovascular: Normal rate, regular rhythm, normal heart sounds and intact distal pulses.  Exam reveals no gallop and no friction rub.   No murmur heard. Pulmonary/Chest: Effort normal and breath sounds normal. No respiratory distress. He has no wheezes. He has no rales.  Abdominal: Soft. He exhibits no distension. There is no tenderness. There is no  guarding.  Musculoskeletal: He exhibits no edema.  Neurological: He is alert and oriented to person, place, and time.  Skin: Skin is warm and dry. He is not diaphoretic.  Psychiatric: His mood appears anxious.  Nursing note and vitals reviewed.   ED Course  Procedures (including critical care time) Labs Review Labs Reviewed  CBC WITH DIFFERENTIAL/PLATELET - Abnormal; Notable for the following:    RBC 4.18 (*)    Hemoglobin 12.0  (*)    HCT 38.6 (*)    All other components within normal limits  COMPREHENSIVE METABOLIC PANEL - Abnormal; Notable for the following:    Sodium 146 (*)    Glucose, Bld 187 (*)    ALT 15 (*)    All other components within normal limits  BRAIN NATRIURETIC PEPTIDE - Abnormal; Notable for the following:    B Natriuretic Peptide 219.4 (*)    All other components within normal limits  ETHANOL  URINE RAPID DRUG SCREEN, HOSP PERFORMED  URINALYSIS, ROUTINE W REFLEX MICROSCOPIC (NOT AT [facility])  ACETAMINOPHEN LEVEL  I-STAT TROPOININ, ED  Rosezena Sensor, ED    Imaging Review Dg Chest 2 View  07/28/2015  CLINICAL DATA:  Initial evaluation for shortness of breath. Congestion for 2 weeks. EXAM: CHEST  2 VIEW COMPARISON:  Prior radiograph from 07/20/2015. FINDINGS: Cardiac and mediastinal silhouettes are stable in size and contour, and remain within normal limits. Atheromatous plaque within the aortic arch. Intrathoracic aorta is mildly tortuous, stable. Lung volumes are symmetric and within normal limits. No pneumothorax. No consolidative airspace opacity. No pulmonary edema or pleural effusion. Mild diffuse bronchitic changes, similar to previous. No acute osseous abnormality. Metallic density overlying the medial right hemidiaphragm is stable from previous. IMPRESSION: 1. No active cardiopulmonary disease. 2. Diffuse bronchitic changes, similar to prior. Electronically Signed   By: [name]   On: 07/28/2015 22:46   Ct Head Wo Contrast  07/29/2015  CLINICAL DATA:  Acute onset of sleepiness. Respiratory distress. Patient on crack cocaine. Initial encounter. EXAM: CT HEAD WITHOUT CONTRAST TECHNIQUE: Contiguous axial images were obtained from the base of the skull through the vertex without intravenous contrast. COMPARISON:  CT of the head performed 05/11/2014 FINDINGS: There is no evidence of acute infarction, mass lesion, or intra- or extra-axial hemorrhage on CT. Mild periventricular  white matter change likely reflects small vessel ischemic microangiopathy. The posterior fossa, including the cerebellum, brainstem and fourth ventricle, is within normal limits. The third and lateral ventricles, and basal ganglia are unremarkable in appearance. The cerebral hemispheres are symmetric in appearance, with normal gray-white differentiation. No mass effect or midline shift is seen. There is no evidence of fracture; visualized osseous structures are unremarkable in appearance. The orbits are within normal limits. The paranasal sinuses and mastoid air cells are well-aerated. No significant soft tissue abnormalities are seen. IMPRESSION: 1. No acute intracranial pathology seen on CT. 2. Mild small vessel ischemic microangiopathy. Electronically Signed   By: Roanna Raider M.D.   On: 07/29/2015 03:09   I have personally reviewed and evaluated these images and lab results as part of my medical decision-making.   EKG Interpretation None      Sinus rhythm Probable left atrial enlargement Anteroseptal infarct, old Abnormal T, consider ischemia, diffuse leads TW abnormality, similar to prior. Unable to access muse at this time.   MDM   Final diagnoses:  None   55 year old male with a history of diabetes, hypertension, CHF, schizophrenia, cocaine use, multiple emergency department visits and psychiatry evaluations  presents with concern for shortness of breath after using crack cocaine, and reported suicidal ideation at triage. Initial BP 200s systolic and given ativan for hypertension in setting of cocaine use.  EKG shows T-wave abnormality similar to prior.  Chest x-ray shows no acute abnormalities. Patient delta troponins which were both negative and have low suspicion for ACS. BNP is similar to prior, doubt CHF exacerbation. Patient does not have any significant wheezing on exam and COPD exacerbation.  On my reevaluation, tended to obtain more history from patient, however he is in a deep  sleep, difficult to have a conversation with, however is protecting his airway. Ordered CT head. Patient with history of similar episodes in ED. ETOH negative. Will continue to monitor, pt likely sleeping after using cocaine, will continue to monitor as he becomes clinically sober, reevaluate and reevaluate SI.   Alvira Monday, MD 07/29/15 [phone]

## 2015-07-29 NOTE — ED Provider Notes (Signed)
Signed out by Dr. Dalene Seltzer.  Follow-up CT, UDS, Etoh.  Labwork reassuring. Patient no longer somnolent. On multiple returns is awake, alert, and oriented. Without complaint. He is currently eating and at his baseline. Plan for discharge home.  Results for orders placed or performed during the hospital encounter of 07/28/15  CBC with Differential  Result Value Ref Range   WBC 6.8 4.0 - 10.5 K/uL   RBC 4.18 (L) 4.22 - 5.81 MIL/uL   Hemoglobin 12.0 (L) 13.0 - 17.0 g/dL   HCT 40.9 (L) 81.1 - 91.4 %   MCV 92.3 78.0 - 100.0 fL   MCH 28.7 26.0 - 34.0 pg   MCHC 31.1 30.0 - 36.0 g/dL   RDW 78.2 95.6 - 21.3 %   Platelets 311 150 - 400 K/uL   Neutrophils Relative % 73 %   Neutro Abs 5.0 1.7 - 7.7 K/uL   Lymphocytes Relative 19 %   Lymphs Abs 1.3 0.7 - 4.0 K/uL   Monocytes Relative 7 %   Monocytes Absolute 0.4 0.1 - 1.0 K/uL   Eosinophils Relative 1 %   Eosinophils Absolute 0.1 0.0 - 0.7 K/uL   Basophils Relative 0 %   Basophils Absolute 0.0 0.0 - 0.1 K/uL  Comprehensive metabolic panel  Result Value Ref Range   Sodium 146 (H) 135 - 145 mmol/L   Potassium 3.8 3.5 - 5.1 mmol/L   Chloride 109 101 - 111 mmol/L   CO2 27 22 - 32 mmol/L   Glucose, Bld 187 (H) 65 - 99 mg/dL   BUN 15 6 - 20 mg/dL   Creatinine, Ser 0.86 0.61 - 1.24 mg/dL   Calcium 9.6 8.9 - 57.8 mg/dL   Total Protein 7.0 6.5 - 8.1 g/dL   Albumin 3.7 3.5 - 5.0 g/dL   AST 21 15 - 41 U/L   ALT 15 (L) 17 - 63 U/L   Alkaline Phosphatase 100 38 - 126 U/L   Total Bilirubin 0.3 0.3 - 1.2 mg/dL   GFR calc non Af Amer >60 >60 mL/min   GFR calc Af Amer >60 >60 mL/min   Anion gap 10 5 - 15  Brain natriuretic peptide  Result Value Ref Range   B Natriuretic Peptide 219.4 (H) 0.0 - 100.0 pg/mL  Urine rapid drug screen (hosp performed)  Result Value Ref Range   Opiates NONE DETECTED NONE DETECTED   Cocaine POSITIVE (A) NONE DETECTED   Benzodiazepines NONE DETECTED NONE DETECTED   Amphetamines NONE DETECTED NONE DETECTED   Tetrahydrocannabinol NONE DETECTED NONE DETECTED   Barbiturates NONE DETECTED NONE DETECTED  Urinalysis, Routine w reflex microscopic (not at [facility])  Result Value Ref Range   Color, Urine YELLOW YELLOW   APPearance CLEAR CLEAR   Specific Gravity, Urine 1.026 1.005 - 1.030   pH 6.0 5.0 - 8.0   Glucose, UA 100 (A) NEGATIVE mg/dL   Hgb urine dipstick NEGATIVE NEGATIVE   Bilirubin Urine NEGATIVE NEGATIVE   Ketones, ur NEGATIVE NEGATIVE mg/dL   Protein, ur 30 (A) NEGATIVE mg/dL   Nitrite NEGATIVE NEGATIVE   Leukocytes, UA NEGATIVE NEGATIVE  Ethanol  Result Value Ref Range   Alcohol, Ethyl (B) <5 <5 mg/dL  Urine microscopic-add on  Result Value Ref Range   Squamous Epithelial / LPF NONE SEEN NONE SEEN   WBC, UA 0-5 0 - 5 WBC/hpf   RBC / HPF NONE SEEN 0 - 5 RBC/hpf   Bacteria, UA FEW (A) NONE SEEN   Crystals CA OXALATE CRYSTALS (A) NEGATIVE  Urine-Other MUCOUS PRESENT   I-Stat Troponin, ED - 0, 3, 6 hours (not at [facility])  Result Value Ref Range   Troponin i, poc 0.03 0.00 - 0.08 ng/mL   Comment 3          I-Stat Troponin, ED - 0, 3, 6 hours (not at [facility])  Result Value Ref Range   Troponin i, poc 0.04 0.00 - 0.08 ng/mL   Comment 3          I-Stat Troponin, ED - 0, 3, 6 hours (not at [facility])  Result Value Ref Range   Troponin i, poc 0.04 0.00 - 0.08 ng/mL   Comment 3           Dg Chest 2 View  07/28/2015  CLINICAL DATA:  Initial evaluation for shortness of breath. Congestion for 2 weeks. EXAM: CHEST  2 VIEW COMPARISON:  Prior radiograph from 07/20/2015. FINDINGS: Cardiac and mediastinal silhouettes are stable in size and contour, and remain within normal limits. Atheromatous plaque within the aortic arch. Intrathoracic aorta is mildly tortuous, stable. Lung volumes are symmetric and within normal limits. No pneumothorax. No consolidative airspace opacity. No pulmonary edema or pleural effusion. Mild diffuse bronchitic changes, similar to previous. No acute osseous abnormality. Metallic  density overlying the medial right hemidiaphragm is stable from previous. IMPRESSION: 1. No active cardiopulmonary disease. 2. Diffuse bronchitic changes, similar to prior. Electronically Signed   By: [name]   On: 07/28/2015 22:46   Dg Chest 2 View  07/20/2015  CLINICAL DATA:  55 year old male with chest pain and shortness of breath EXAM: CHEST  2 VIEW COMPARISON:  Radiograph dated 07/15/2015 FINDINGS: Two views of the chest demonstrate increased central vascular and interstitial prominence, likely congestive changes. Minimal interstitial prominence in the mid to lower lung fields similar prior study may represent congestion. Superimposed pneumonia is not excluded. There is no focal consolidation, pleural effusion, or pneumothorax. The cardiac silhouette is within normal limits. The osseous structures appear unremarkable. IMPRESSION: Mild congestive changes.  No focal consolidation. Electronically Signed   By: Elgie Collard M.D.   On: 07/20/2015 04:50   Dg Chest 2 View  07/15/2015  CLINICAL DATA:  Acute onset of productive cough and nasal congestion. Initial encounter. EXAM: CHEST  2 VIEW COMPARISON:  Chest radiograph from 06/03/2015 FINDINGS: The lungs are well-aerated. Vascular congestion is noted. There is no evidence of focal opacification, pleural effusion or pneumothorax. The heart is normal in size; the mediastinal contour is within normal limits. No acute osseous abnormalities are seen. IMPRESSION: Vascular congestion noted.  Lungs remain grossly clear. Electronically Signed   By: Roanna Raider M.D.   On: 07/15/2015 22:36   Ct Head Wo Contrast  07/29/2015  CLINICAL DATA:  Acute onset of sleepiness. Respiratory distress. Patient on crack cocaine. Initial encounter. EXAM: CT HEAD WITHOUT CONTRAST TECHNIQUE: Contiguous axial images were obtained from the base of the skull through the vertex without intravenous contrast. COMPARISON:  CT of the head performed 05/11/2014 FINDINGS:  There is no evidence of acute infarction, mass lesion, or intra- or extra-axial hemorrhage on CT. Mild periventricular white matter change likely reflects small vessel ischemic microangiopathy. The posterior fossa, including the cerebellum, brainstem and fourth ventricle, is within normal limits. The third and lateral ventricles, and basal ganglia are unremarkable in appearance. The cerebral hemispheres are symmetric in appearance, with normal gray-white differentiation. No mass effect or midline shift is seen. There is no evidence of fracture; visualized osseous structures are unremarkable in appearance.  The orbits are within normal limits. The paranasal sinuses and mastoid air cells are well-aerated. No significant soft tissue abnormalities are seen. IMPRESSION: 1. No acute intracranial pathology seen on CT. 2. Mild small vessel ischemic microangiopathy. Electronically Signed   By: Roanna Raider M.D.   On: 07/29/2015 03:09      Shon Baton, MD 07/29/15 [phone]

## 2015-07-29 NOTE — ED Notes (Addendum)
Patient getting dressed in room. Patient given bus pass.

## 2015-07-31 ENCOUNTER — Emergency Department (HOSPITAL_COMMUNITY)
Admission: EM | Admit: 2015-07-31 | Discharge: 2015-07-31 | Disposition: A | Discharge status: Home / Self Care | Payer: Medicare Other | Attending: Emergency Medicine | Admitting: Emergency Medicine

## 2015-07-31 ENCOUNTER — Encounter (HOSPITAL_COMMUNITY): Payer: Self-pay | Admitting: *Deleted

## 2015-07-31 DIAGNOSIS — F17210 Nicotine dependence, cigarettes, uncomplicated: Secondary | ICD-10-CM | POA: Insufficient documentation

## 2015-07-31 DIAGNOSIS — G8929 Other chronic pain: Secondary | ICD-10-CM | POA: Insufficient documentation

## 2015-07-31 DIAGNOSIS — Z590 Homelessness: Secondary | ICD-10-CM | POA: Insufficient documentation

## 2015-07-31 DIAGNOSIS — I10 Essential (primary) hypertension: Secondary | ICD-10-CM | POA: Insufficient documentation

## 2015-07-31 DIAGNOSIS — Z8659 Personal history of other mental and behavioral disorders: Secondary | ICD-10-CM | POA: Insufficient documentation

## 2015-07-31 DIAGNOSIS — Z8619 Personal history of other infectious and parasitic diseases: Secondary | ICD-10-CM | POA: Insufficient documentation

## 2015-07-31 DIAGNOSIS — R0981 Nasal congestion: Secondary | ICD-10-CM | POA: Insufficient documentation

## 2015-07-31 DIAGNOSIS — I509 Heart failure, unspecified: Secondary | ICD-10-CM | POA: Insufficient documentation

## 2015-07-31 DIAGNOSIS — E119 Type 2 diabetes mellitus without complications: Secondary | ICD-10-CM | POA: Insufficient documentation

## 2015-07-31 MED ORDER — FLUTICASONE PROPIONATE 50 MCG/ACT NA SUSP: 2.0000 | Freq: Every day | NASAL | Status: DC

## 2015-07-31 MED ORDER — FLUTICASONE PROPIONATE 50 MCG/ACT NA SUSP
1.0000 | Freq: Every day | NASAL | Status: DC
  Administered 2015-07-31: 1 via NASAL
  Filled 2015-07-31: qty 16

## 2015-07-31 NOTE — ED Provider Notes (Signed)
CSN: [id number]     Arrival date & time 07/31/15  0026 History  By signing my name below, I, Bethel Born, attest that this documentation has been prepared under the direction and in the presence of Shon Baton, MD. Electronically Signed: Bethel Born, ED Scribe. 07/31/2015. 3:23 AM    Chief Complaint  Patient presents with  . he wants to sit in the waiting room    The history is provided by the patient. No language interpreter was used.   [name] is a 55 y.o. male with history of homelessness, Schizophrenia, polysubstance abuse, DM, HTN, and CHF  who presents to the Emergency Department complaining of new nasal congestion tonight. Pt states "stuff just keep running out my nose".  No intervention was performed PTA. Pt denies fever, chest pain, cough, and SOB. Per nursing staff, pt was brought in by EMS to avoid being arrested and stated that he just wanted to sit in the waiting room.   Past Medical History  Diagnosis Date  . Diabetes mellitus without complication (HCC)   . Hypertension   . Schizophrenia (HCC)   . CHF (congestive heart failure) (HCC)   . Neuropathy (HCC)   . Polysubstance abuse   . Cocaine abuse   . Homelessness   . Hepatitis C   . Chronic foot pain   . Homelessness    History reviewed. No pertinent past surgical history. Family History  Problem Relation Age of Onset  . Hypertension Other   . Diabetes Other    Social History  Substance Use Topics  . Smoking status: Current Every Day Smoker -- 1.00 packs/day for 20 years    Types: Cigarettes  . Smokeless tobacco: Current User  . Alcohol Use: Yes     Comment: Daily Drinker     Review of Systems  Constitutional: Negative for fever.  HENT: Positive for congestion.   Respiratory: Negative for cough and shortness of breath.   Cardiovascular: Negative for chest pain.  All other systems reviewed and are negative.  Allergies  Haldol  Home Medications   Prior to Admission  medications   Medication Sig Start Date End Date Taking? Authorizing Provider  amantadine (SYMMETREL) 100 MG capsule Take 1 capsule (100 mg total) by mouth 2 (two) times daily. Patient not taking: Reported on 07/25/2015 06/07/15   Earney Navy, NP  amantadine (SYMMETREL) 100 MG capsule Take 1 capsule (100 mg total) by mouth 2 (two) times daily. Patient not taking: Reported on 07/28/2015 07/22/15   Kristeen Mans, NP  chlorproMAZINE (THORAZINE) 50 MG tablet Take 1 tablet (50 mg total) by mouth daily. Patient not taking: Reported on 07/28/2015 07/22/15   Kristeen Mans, NP  fluticasone [facility]) 50 MCG/ACT nasal spray Place 2 sprays into both nostrils daily. 07/31/15   Shon Baton, MD   BP 177/96 mmHg  Pulse 101  Temp(Src) 98.9 F (37.2 C) (Oral)  Resp 18  SpO2 94%94 Physical Exam  Constitutional: He is oriented to person, place, and time. He appears well-developed and well-nourished.  Disheveled appearing, sleeping soundly upon my arrival to the room  HENT:  Head: Normocephalic and atraumatic.  Mouth/Throat: Oropharynx is clear and moist. No oropharyngeal exudate.  Cardiovascular: Normal rate, regular rhythm and normal heart sounds.   No murmur heard. Pulmonary/Chest: Effort normal and breath sounds normal. No respiratory distress. He has no wheezes.  Musculoskeletal: He exhibits no edema.  Neurological: He is alert and oriented to person, place, and time.  Skin: Skin  is warm and dry.  Psychiatric: He has a normal mood and affect.  Nursing note and vitals reviewed.   ED Course  Procedures (including critical care time) DIAGNOSTIC STUDIES: Oxygen Saturation is 94% on RA,  normal by my interpretation.    COORDINATION OF CARE: 3:17 AM Discussed treatment plan which includes Flonase with pt at bedside and pt agreed to plan.  Labs Review Labs Reviewed - No data to display  Imaging Review No results found.   EKG Interpretation None      MDM   Final diagnoses:  Nasal  congestion    This patient is well-known to the ER. Per nursing and EMS documentation, he was brought to the emergency room to avoid being arrested. He requested to sit in the waiting room. He does endorse nasal congestion but no other symptoms. He was provided Flonase and discharged.  After history, exam, and medical workup I feel the patient has been appropriately medically screened and is safe for discharge home. Pertinent diagnoses were discussed with the patient. Patient was given return precautions.  I personally performed the services described in this documentation, which was scribed in my presence. The recorded information has been reviewed and is accurate.    Shon Baton, MD 07/31/15 [phone]

## 2015-07-31 NOTE — ED Notes (Addendum)
Pt departed in NAD. Given sandwich, juice, crackers, and bus pass.

## 2015-07-31 NOTE — ED Notes (Signed)
The pt was brought by ems to triage from the street.  He was going to be arrested by [employer]  For trespassing when he wanted to go to this ed.  [facility] allowed him to come .  He told the paramedics that he wantwed to go to [facility] and sit in the waiting room.  He was in triage and started taking a bath in the sink escorted to waiting room by [employer].

## 2015-07-31 NOTE — ED Notes (Signed)
Pt came in with a coat but no shirt. Pt requested a blue scrub top. Pt given a blue scrub top, pants and sox.

## 2015-07-31 NOTE — Discharge Instructions (Signed)
You were seen today for nasal congestion. Will be discharged with Flonase. Return if you have any new or worsening symptoms.

## 2015-08-10 IMAGING — CR DG CHEST 1V PORT
1 series · 1 of 1 positions shown · non-contrast
Comparison: 12/21/2014

CLINICAL DATA: Leukocytosis, shortness of breath

EXAM:
PORTABLE CHEST - 1 VIEW

[AP]
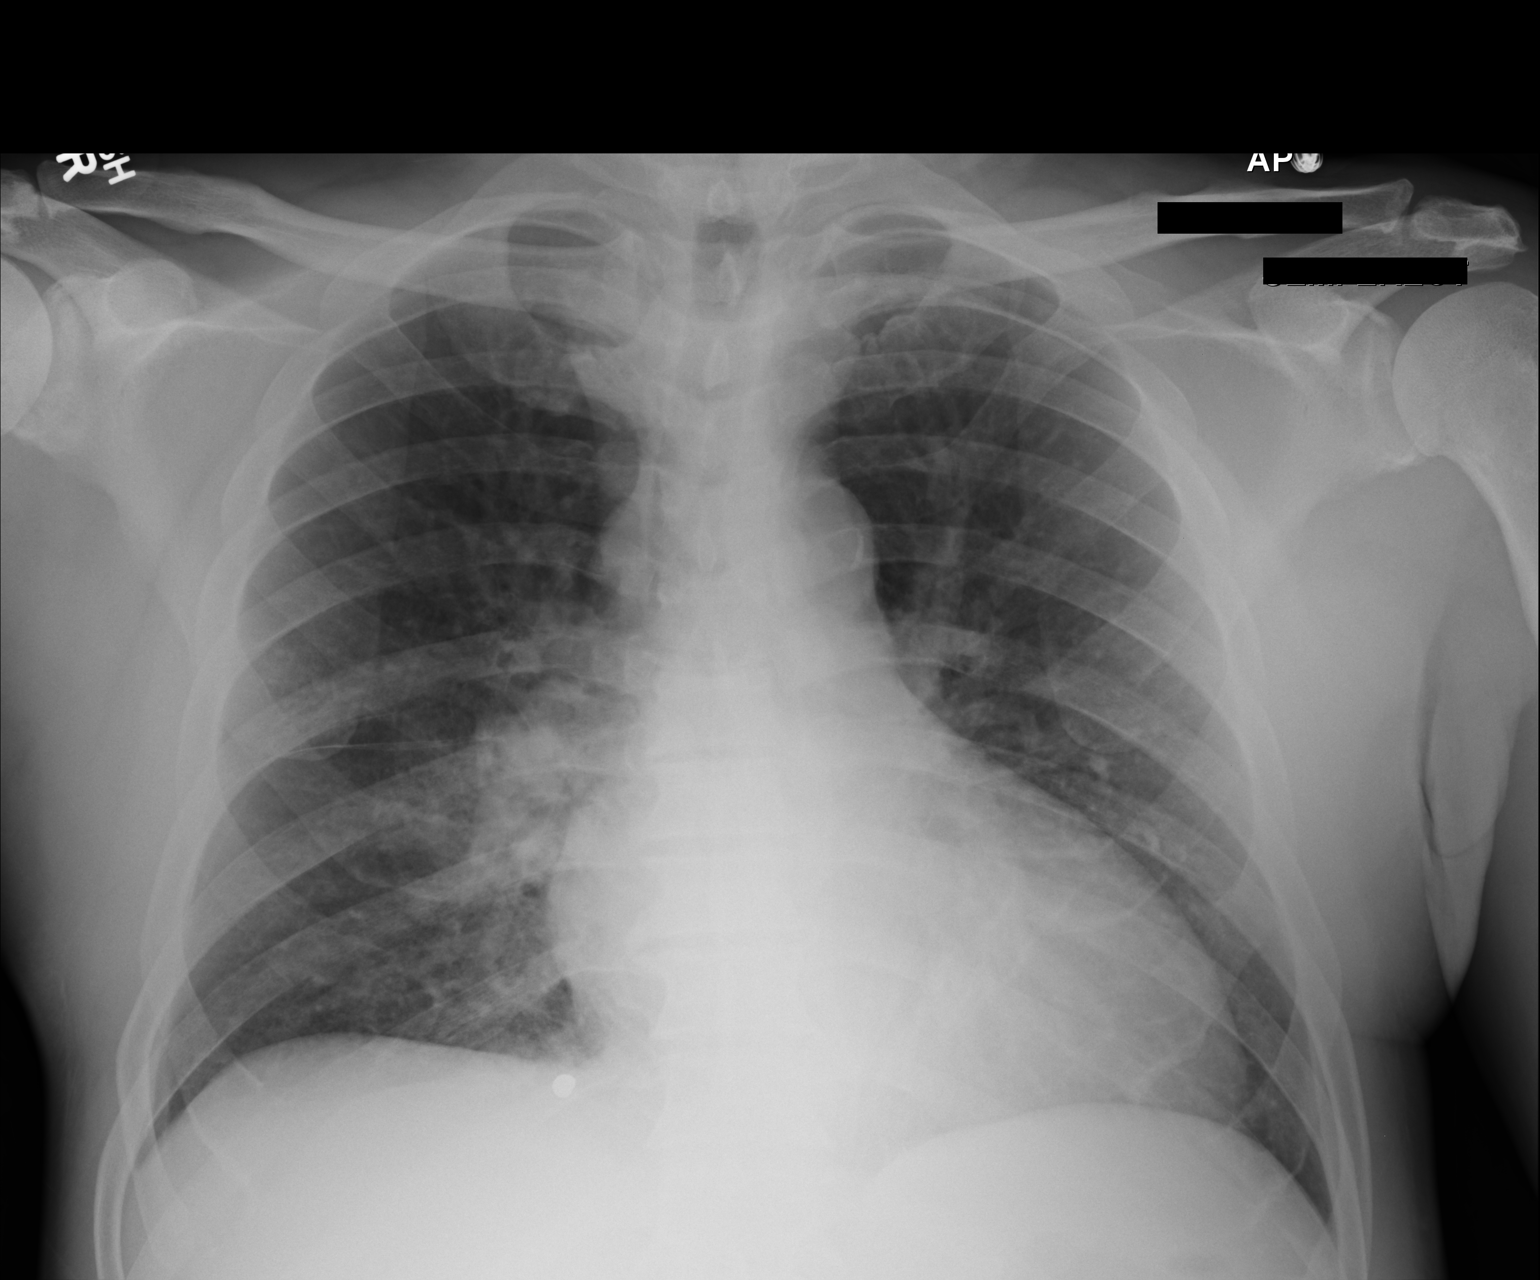

[1 of 1 positions shown; findings below may reference images not displayed]

FINDINGS: Right lower lobe perihilar interstitial disease concerning for an
infectious or inflammatory process. Right hilar prominence again
noted. Left lung is clear. No pleural effusion or pneumothorax.
There is mild stable cardiomegaly.

The osseous structures are unremarkable.
IMPRESSION: Right lower lobe perihilar interstitial disease concerning for an
infectious or inflammatory process.

## 2015-09-25 IMAGING — CR DG HIP (WITH OR WITHOUT PELVIS) 2-3V*R*
3 series · 3 of 3 positions shown · non-contrast
Comparison: None.

CLINICAL DATA: Progressive generalized right hip pain. No known
injury. Initial encounter.

EXAM:
DG HIP (WITH OR WITHOUT PELVIS) 2-3V RIGHT

[t pelvis ap]
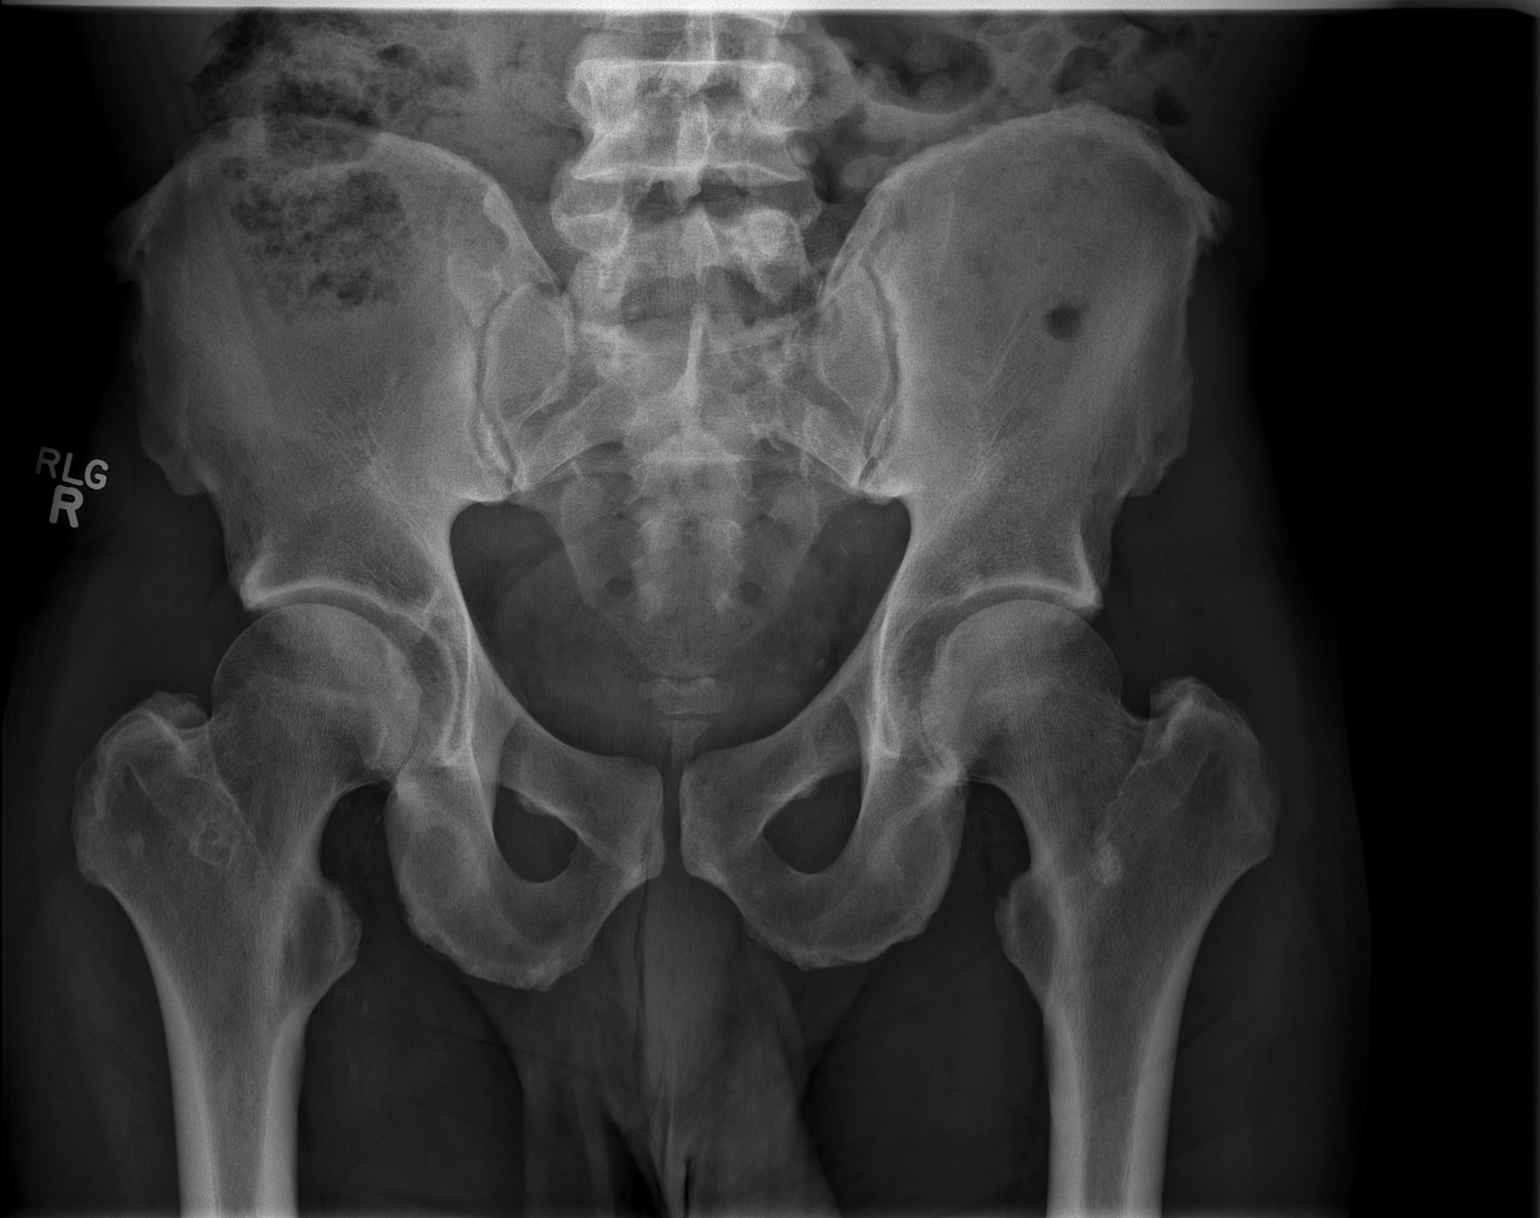

[t hip ap right]
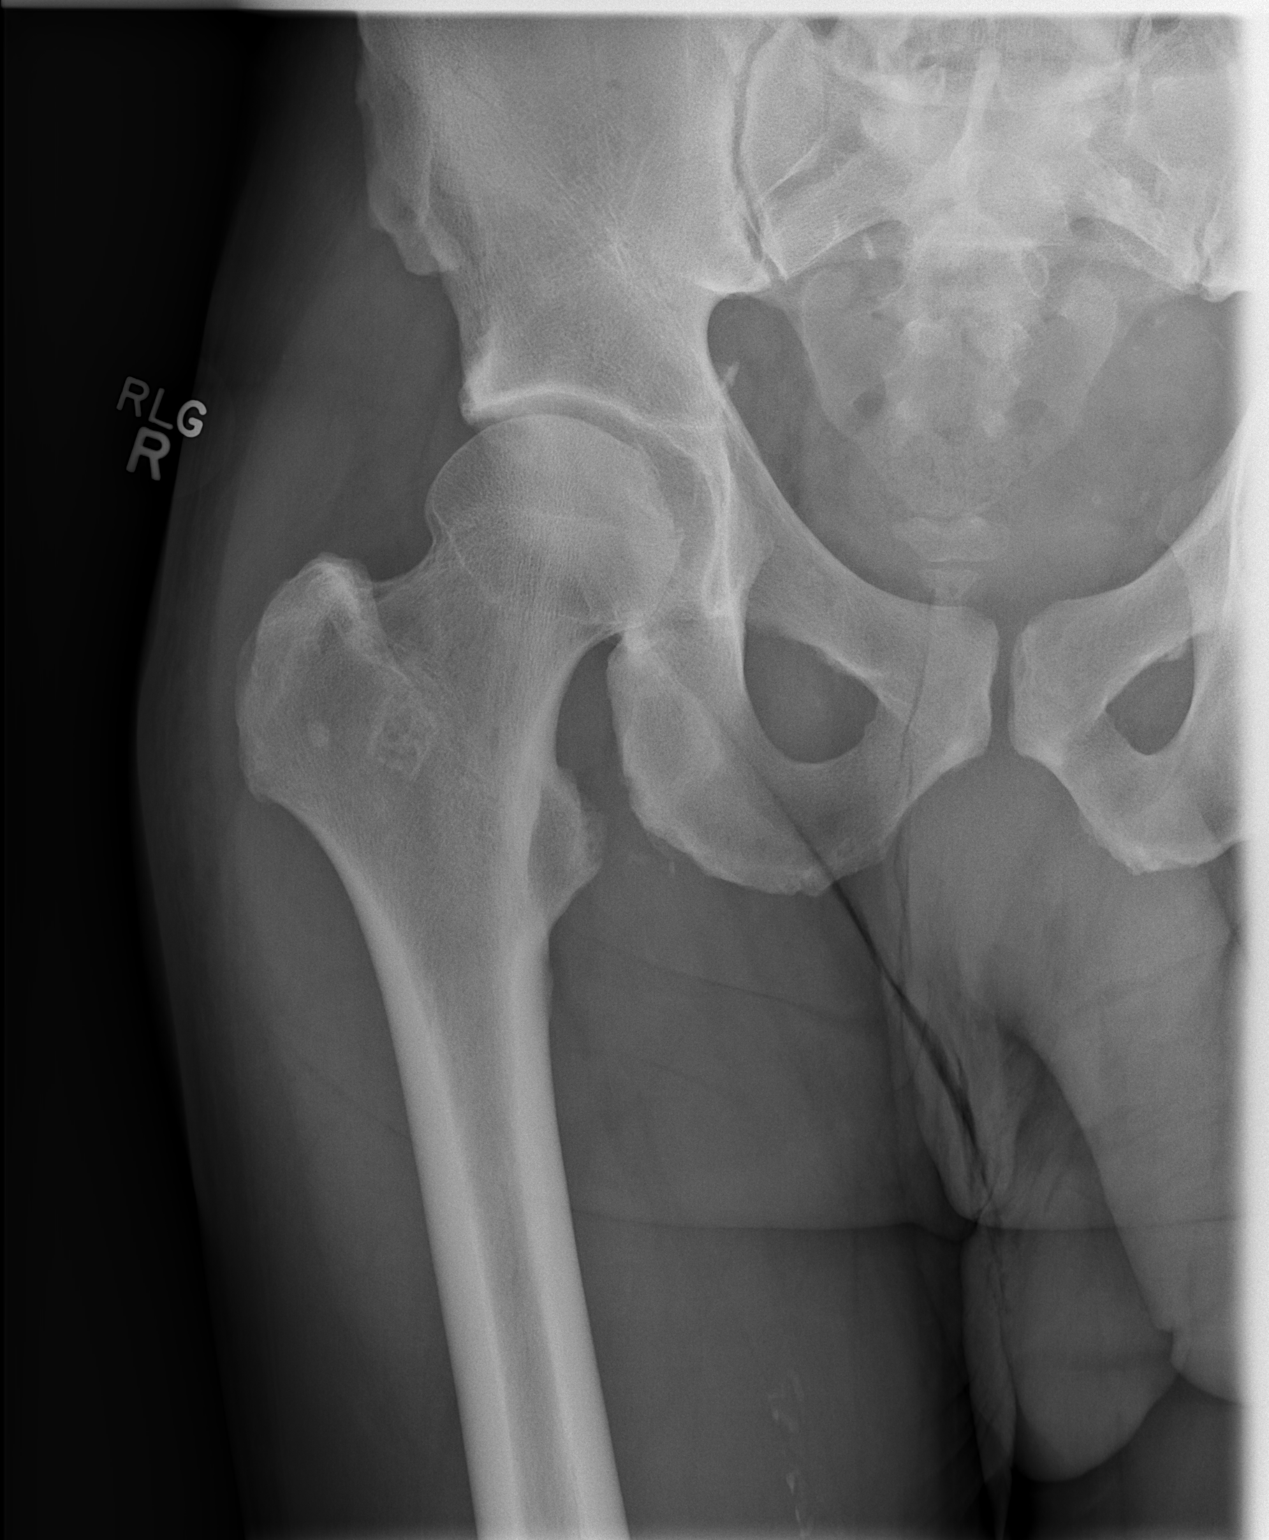

[t hip frog leg right]
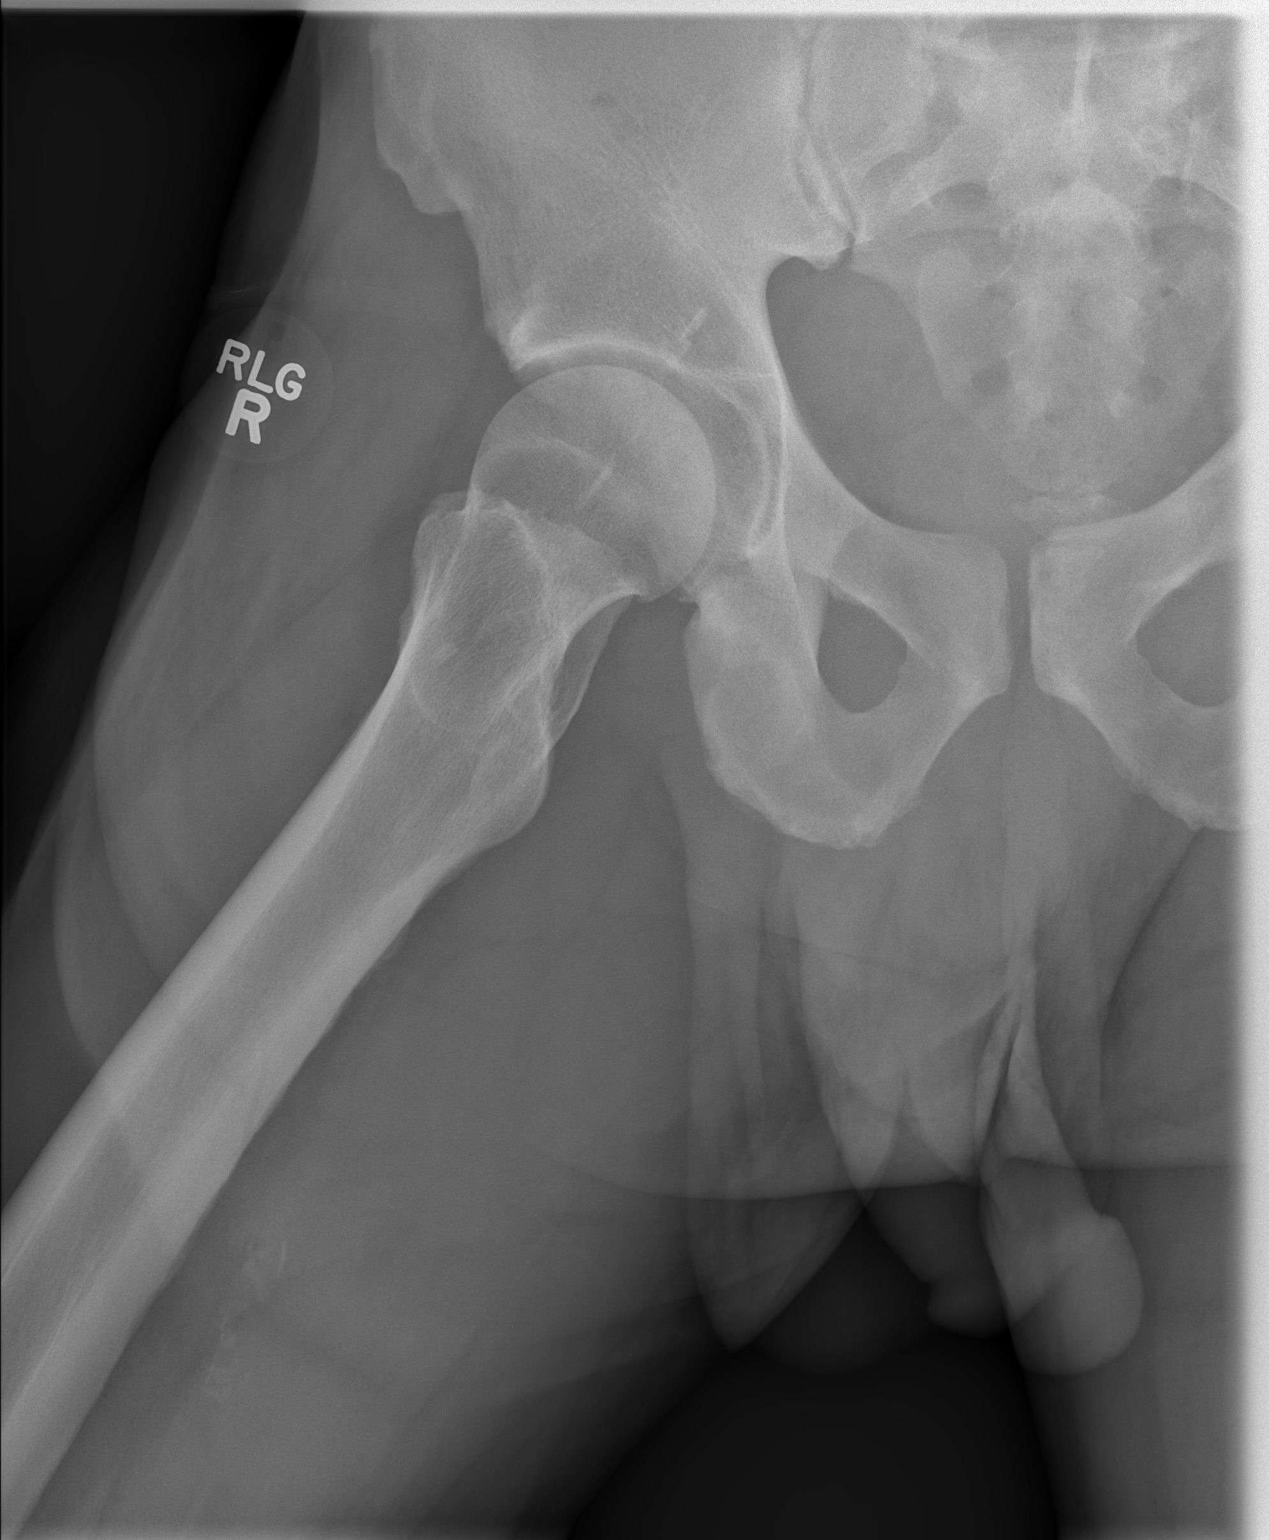

[3 of 3 positions shown; findings below may reference images not displayed]

FINDINGS: The mineralization and alignment are normal. There is no evidence of
acute fracture or dislocation. There is no evidence of femoral head
avascular necrosis. The hip joint spaces are preserved. Peripherally
sclerotic lesion in the intertrochanteric region of the proximal
right femur has a nonaggressive appearance.
IMPRESSION: No acute osseous findings or significant arthropathic changes.

## 2016-01-26 IMAGING — CR DG CHEST 2V
2 series · 2 of 2 positions shown · non-contrast
Comparison: Chest radiograph from 06/03/2015

CLINICAL DATA: Acute onset of productive cough and nasal
congestion. Initial encounter.

EXAM:
CHEST  2 VIEW

[w chest pa]
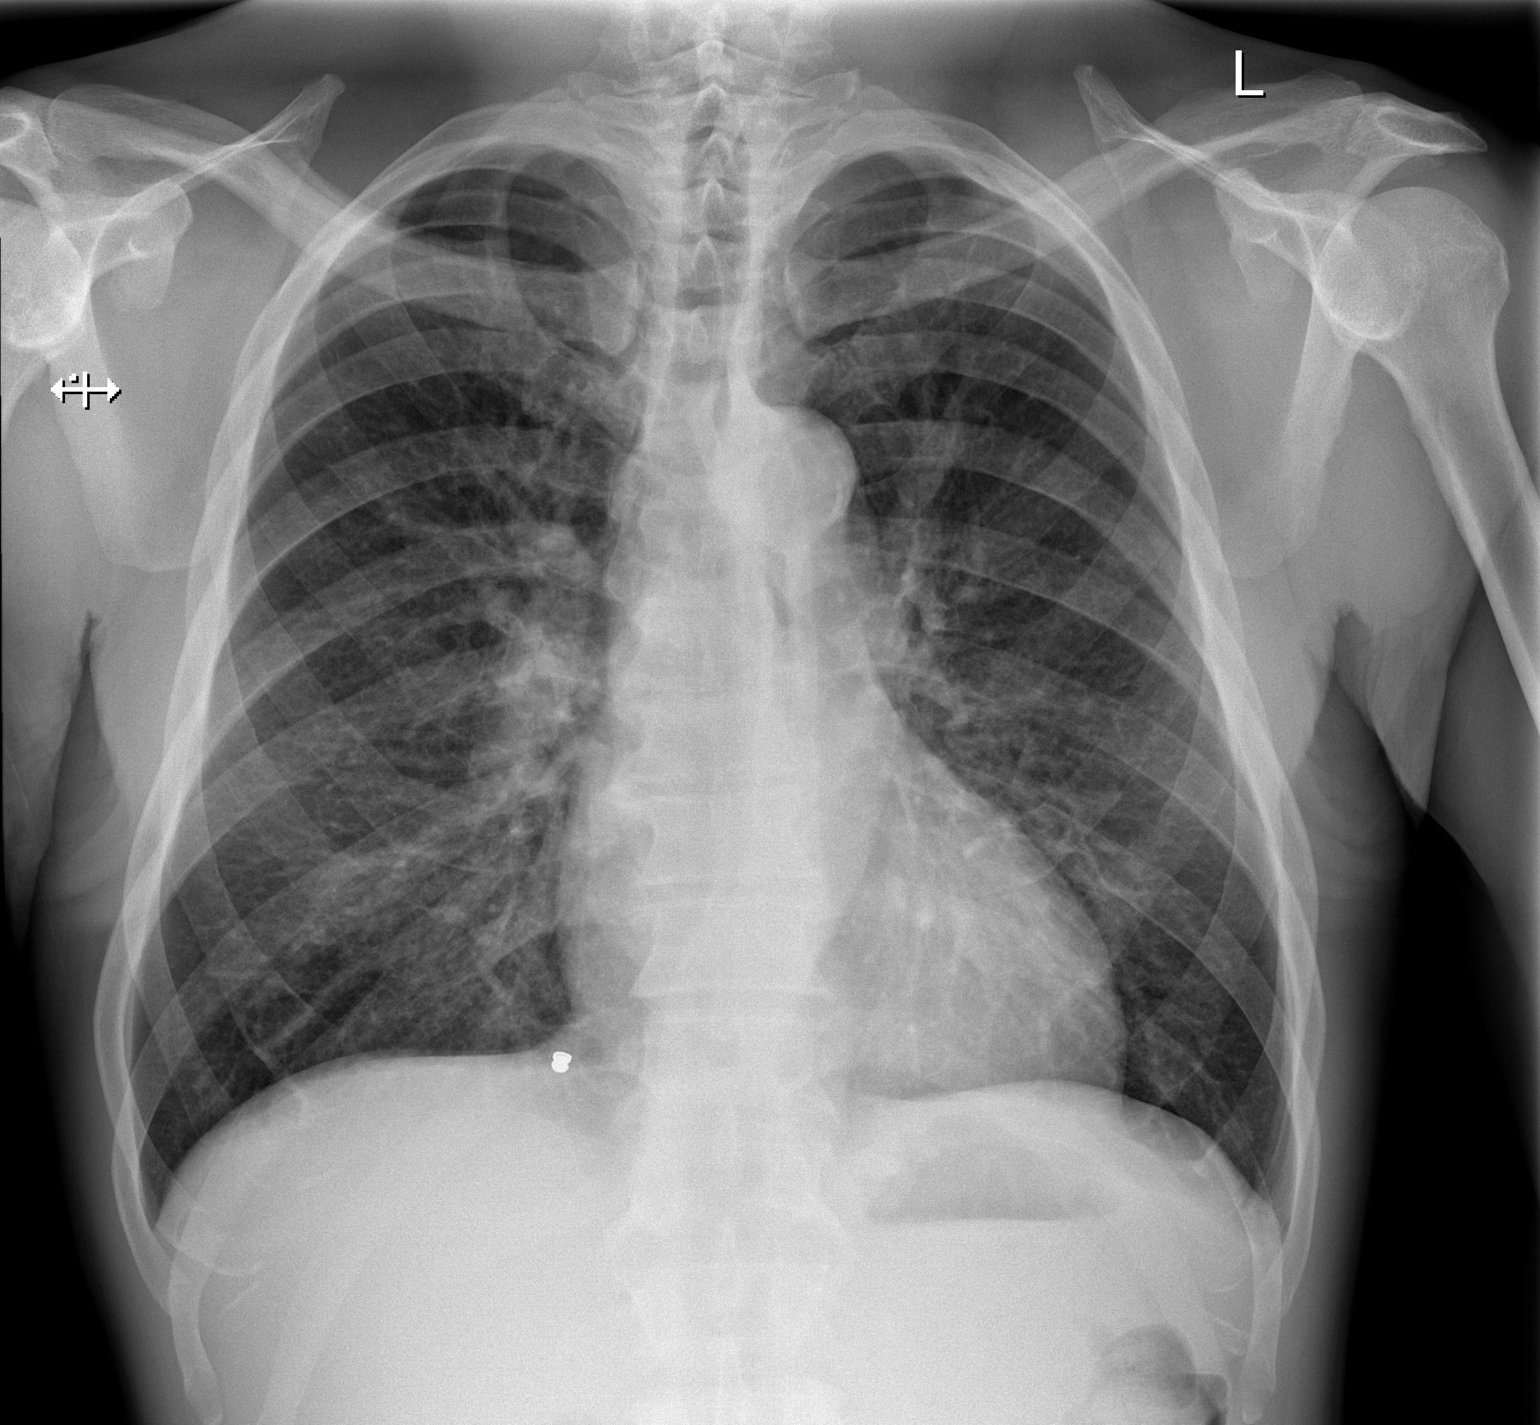

[w chest lat]
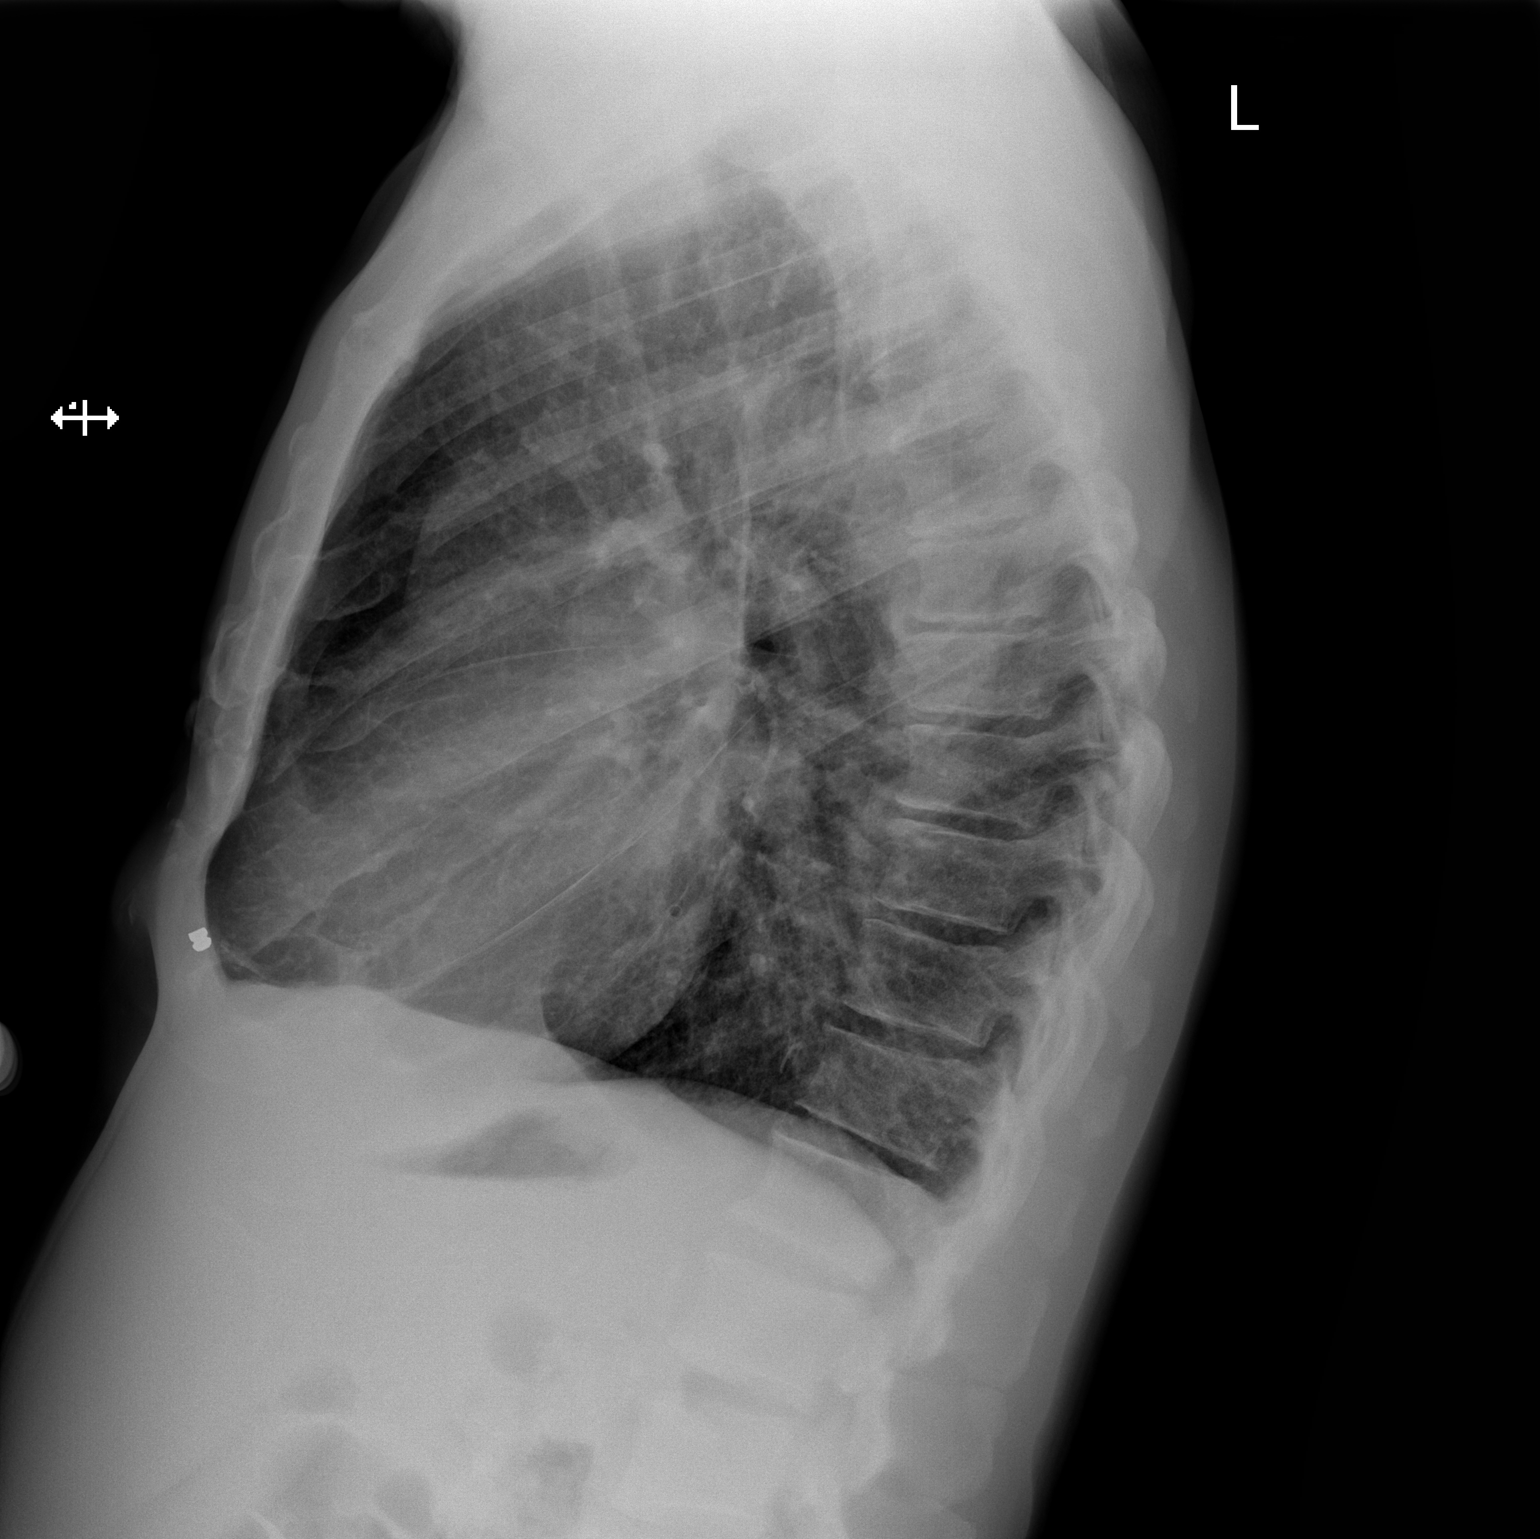

[2 of 2 positions shown; findings below may reference images not displayed]

FINDINGS: The lungs are well-aerated. Vascular congestion is noted. There is
no evidence of focal opacification, pleural effusion or
pneumothorax.

The heart is normal in size; the mediastinal contour is within
normal limits. No acute osseous abnormalities are seen.
IMPRESSION: Vascular congestion noted.  Lungs remain grossly clear.

## 2016-01-31 IMAGING — CR DG CHEST 2V
2 series · 2 of 2 positions shown · non-contrast
Comparison: Radiograph dated 07/15/2015

CLINICAL DATA: 54-year-old male with chest pain and shortness of
breath

EXAM:
CHEST  2 VIEW

[chest pa]
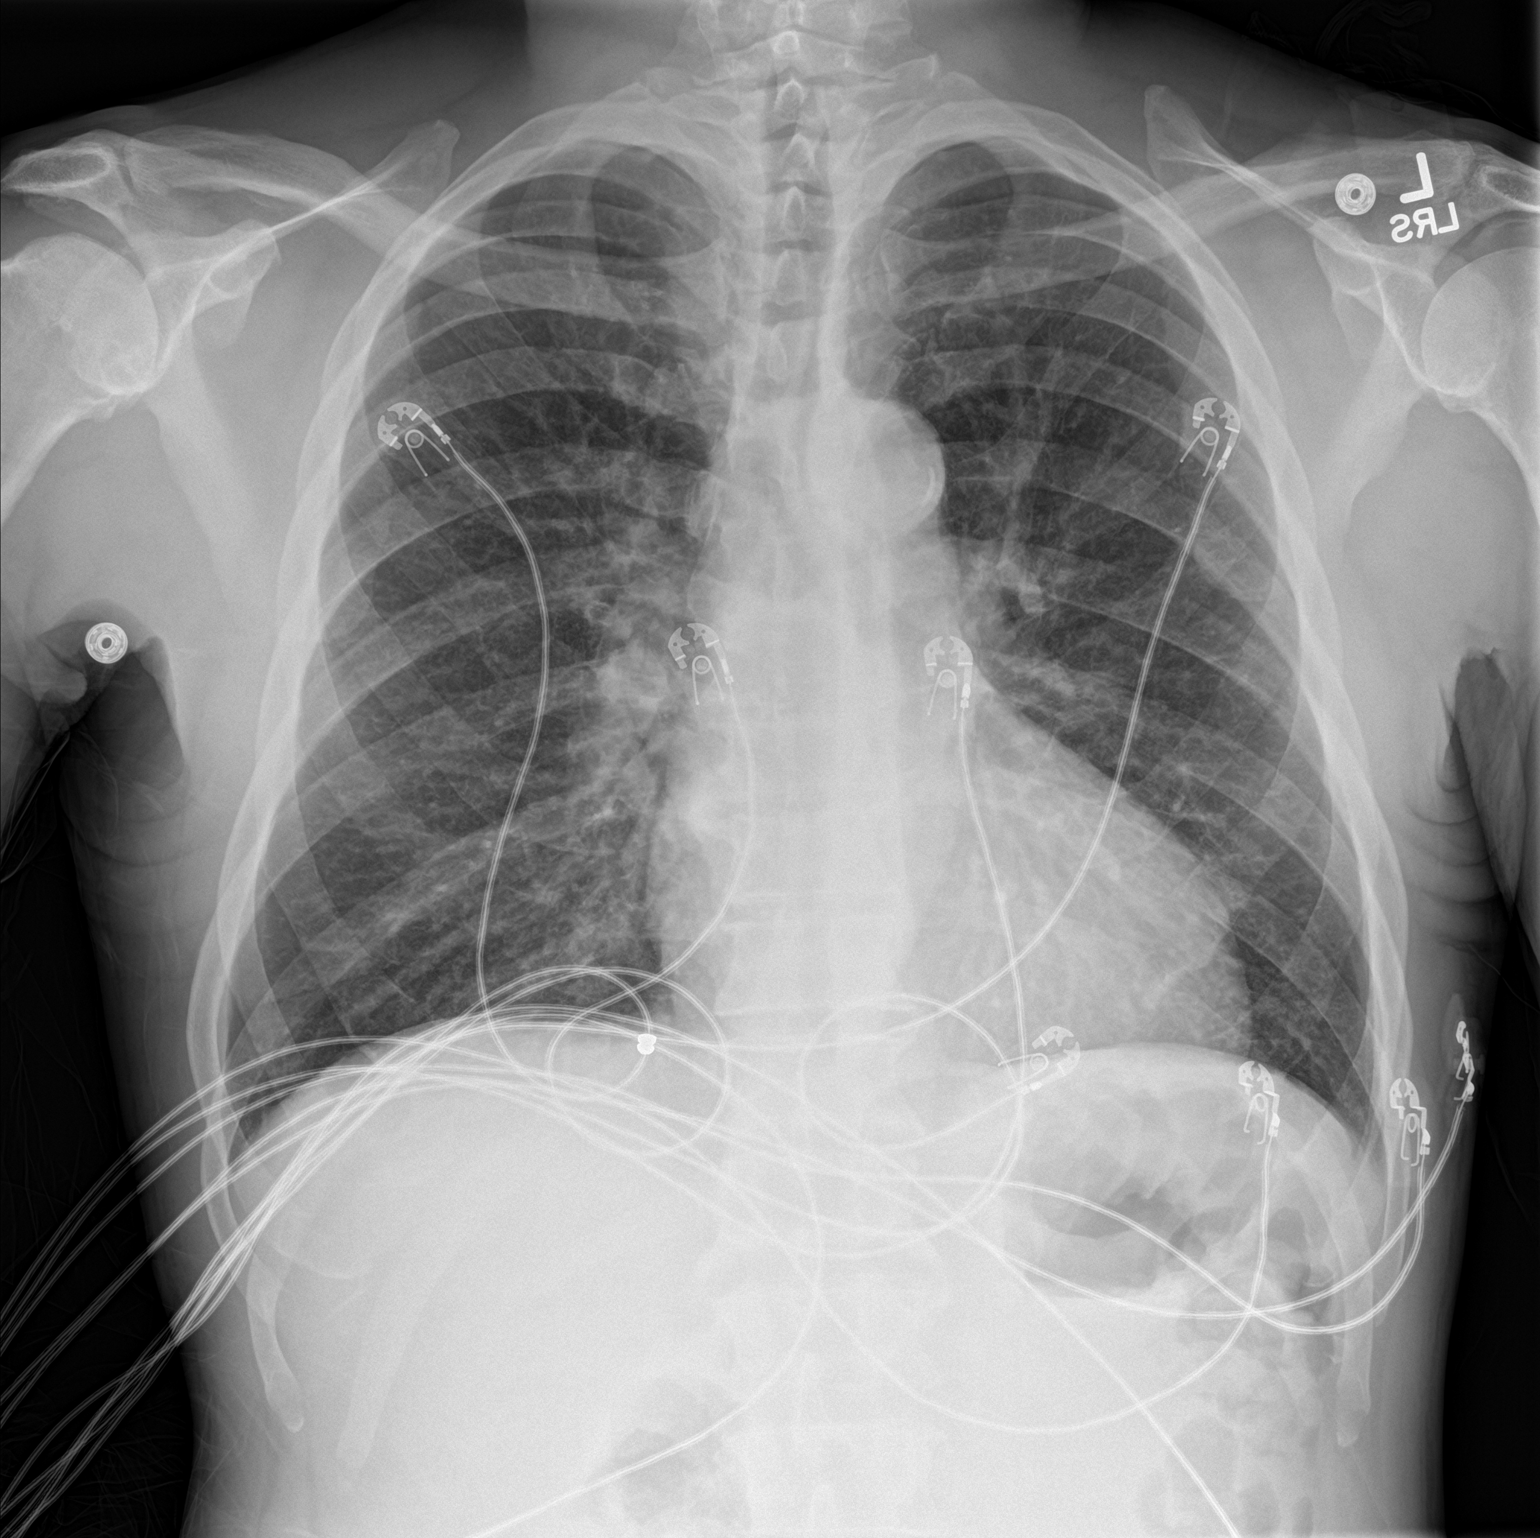

[chest lat]
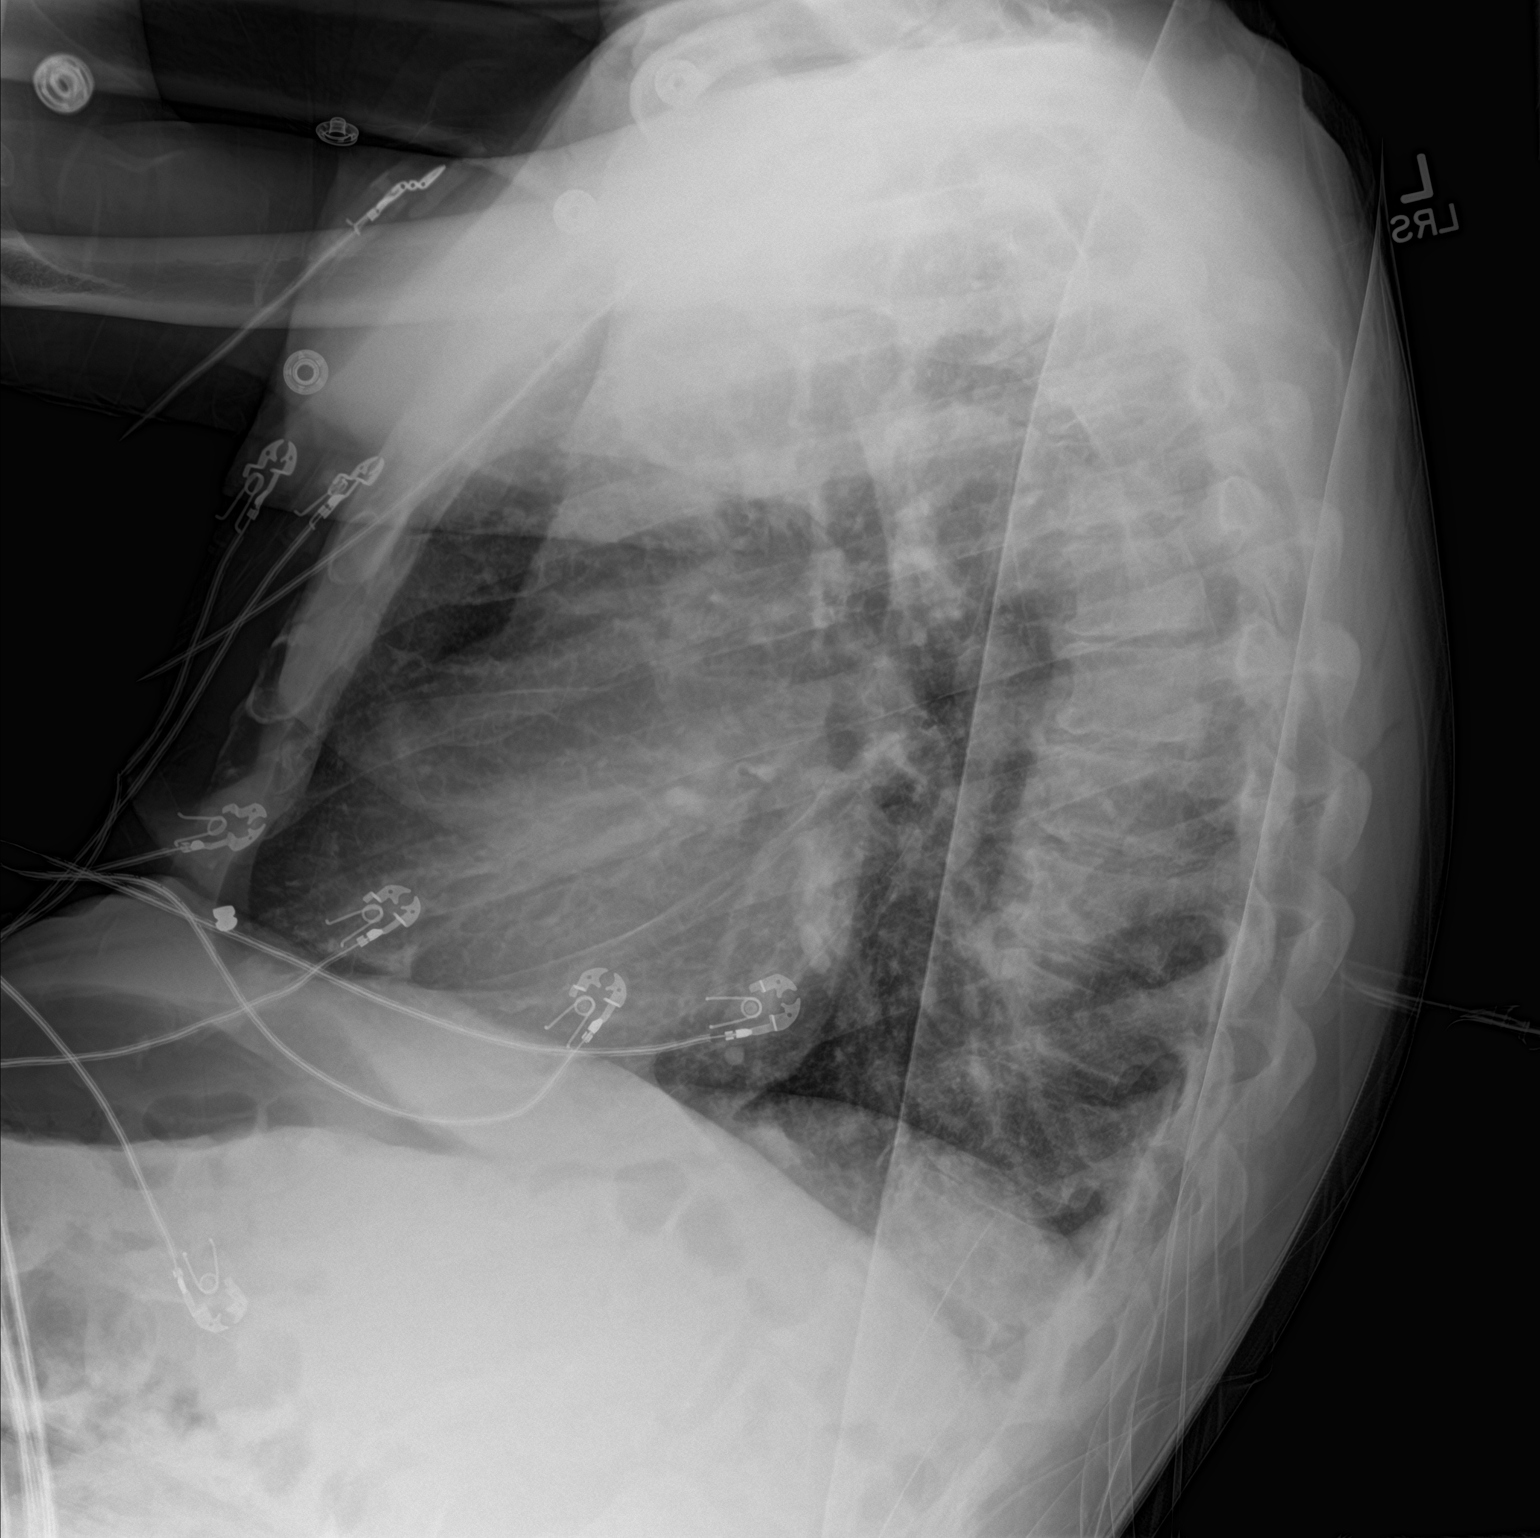

[2 of 2 positions shown; findings below may reference images not displayed]

FINDINGS: Two views of the chest demonstrate increased central vascular and
interstitial prominence, likely congestive changes. Minimal
interstitial prominence in the mid to lower lung fields similar
prior study may represent congestion. Superimposed pneumonia is not
excluded. There is no focal consolidation, pleural effusion, or
pneumothorax. The cardiac silhouette is within normal limits. The
osseous structures appear unremarkable.
IMPRESSION: Mild congestive changes.  No focal consolidation.

## 2016-02-08 IMAGING — CR DG CHEST 2V
2 series · 2 of 2 positions shown · non-contrast
Comparison: Prior radiograph from 07/20/2015.

CLINICAL DATA: Initial evaluation for shortness of breath.
Congestion for 2 weeks.

EXAM:
CHEST  2 VIEW

[w chest pa]
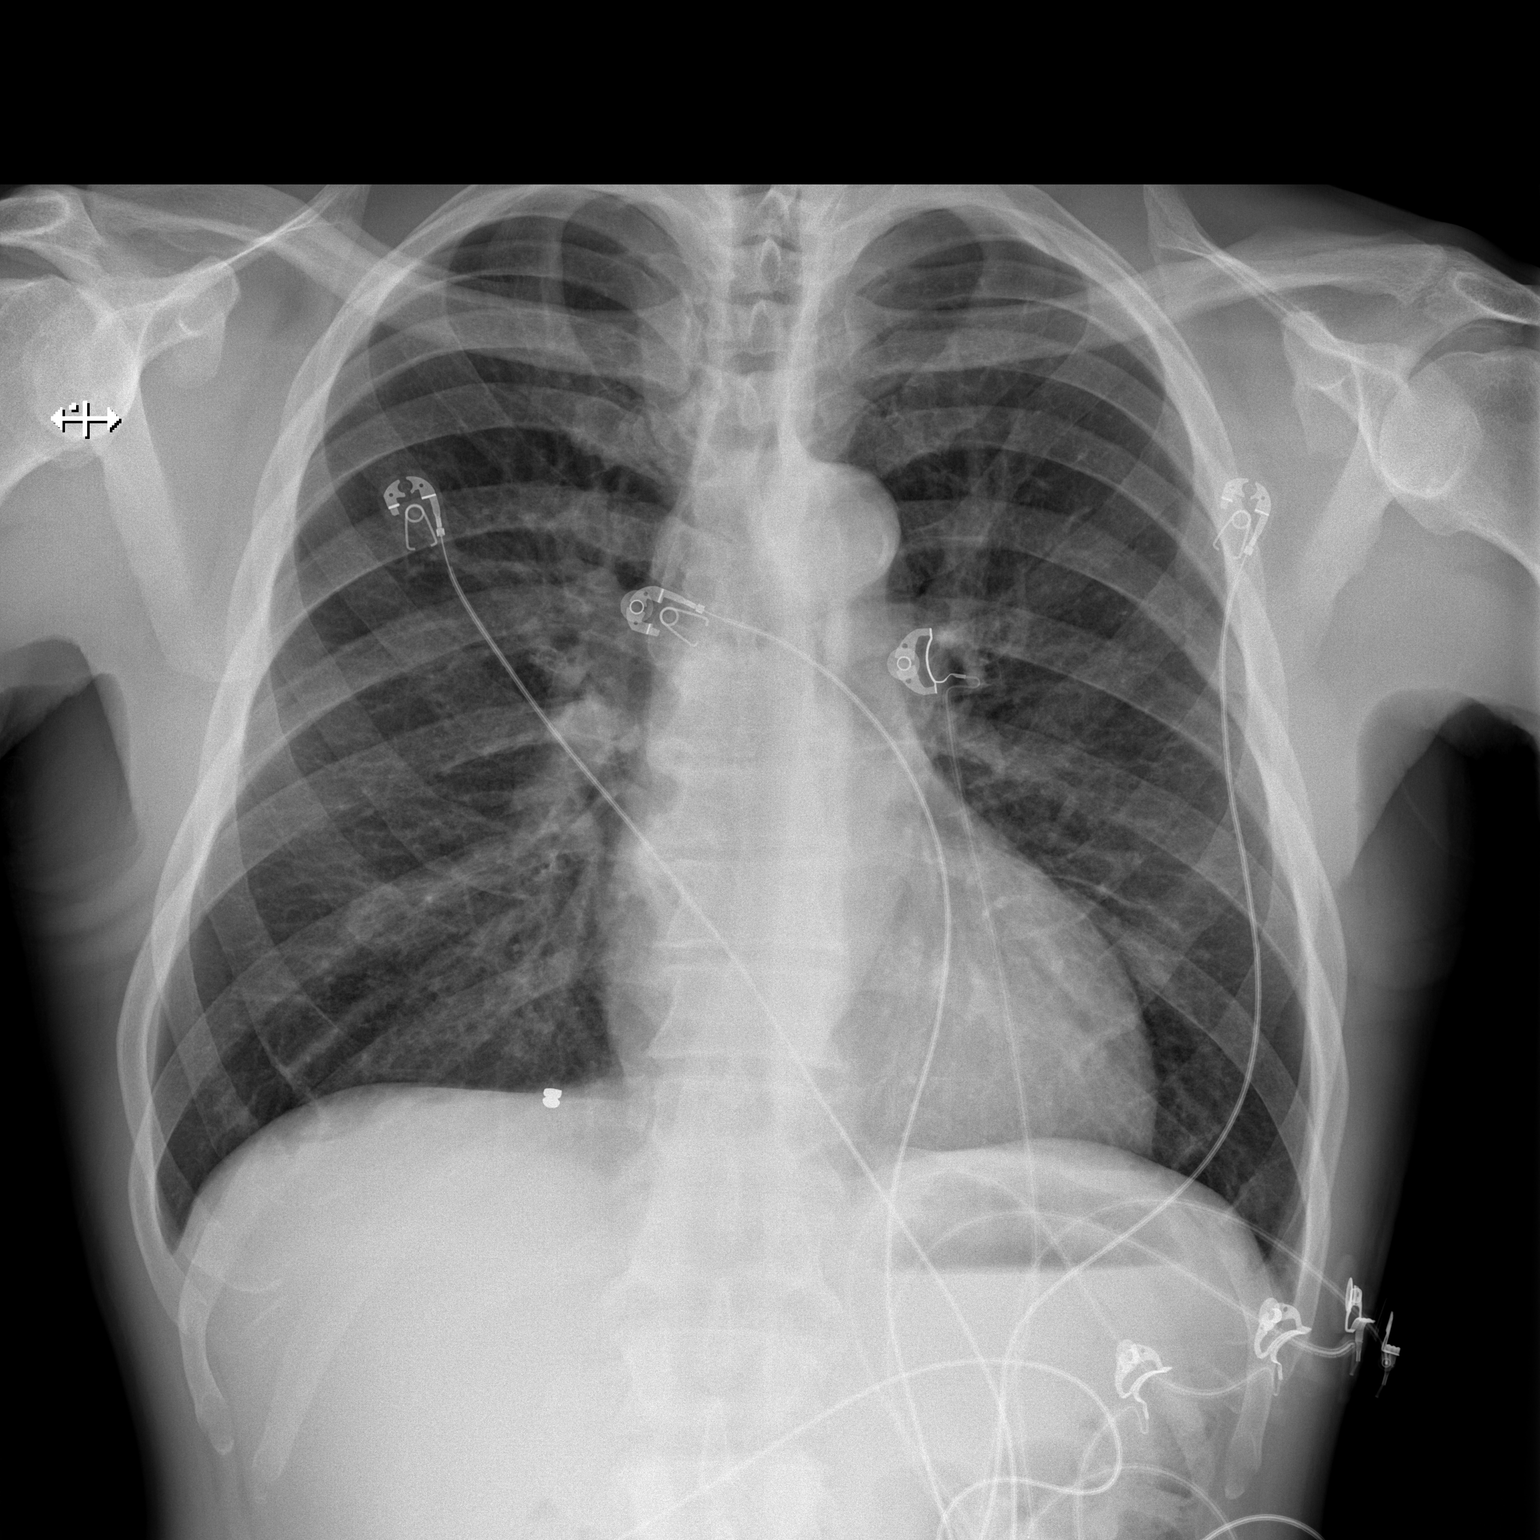

[w chest lat]
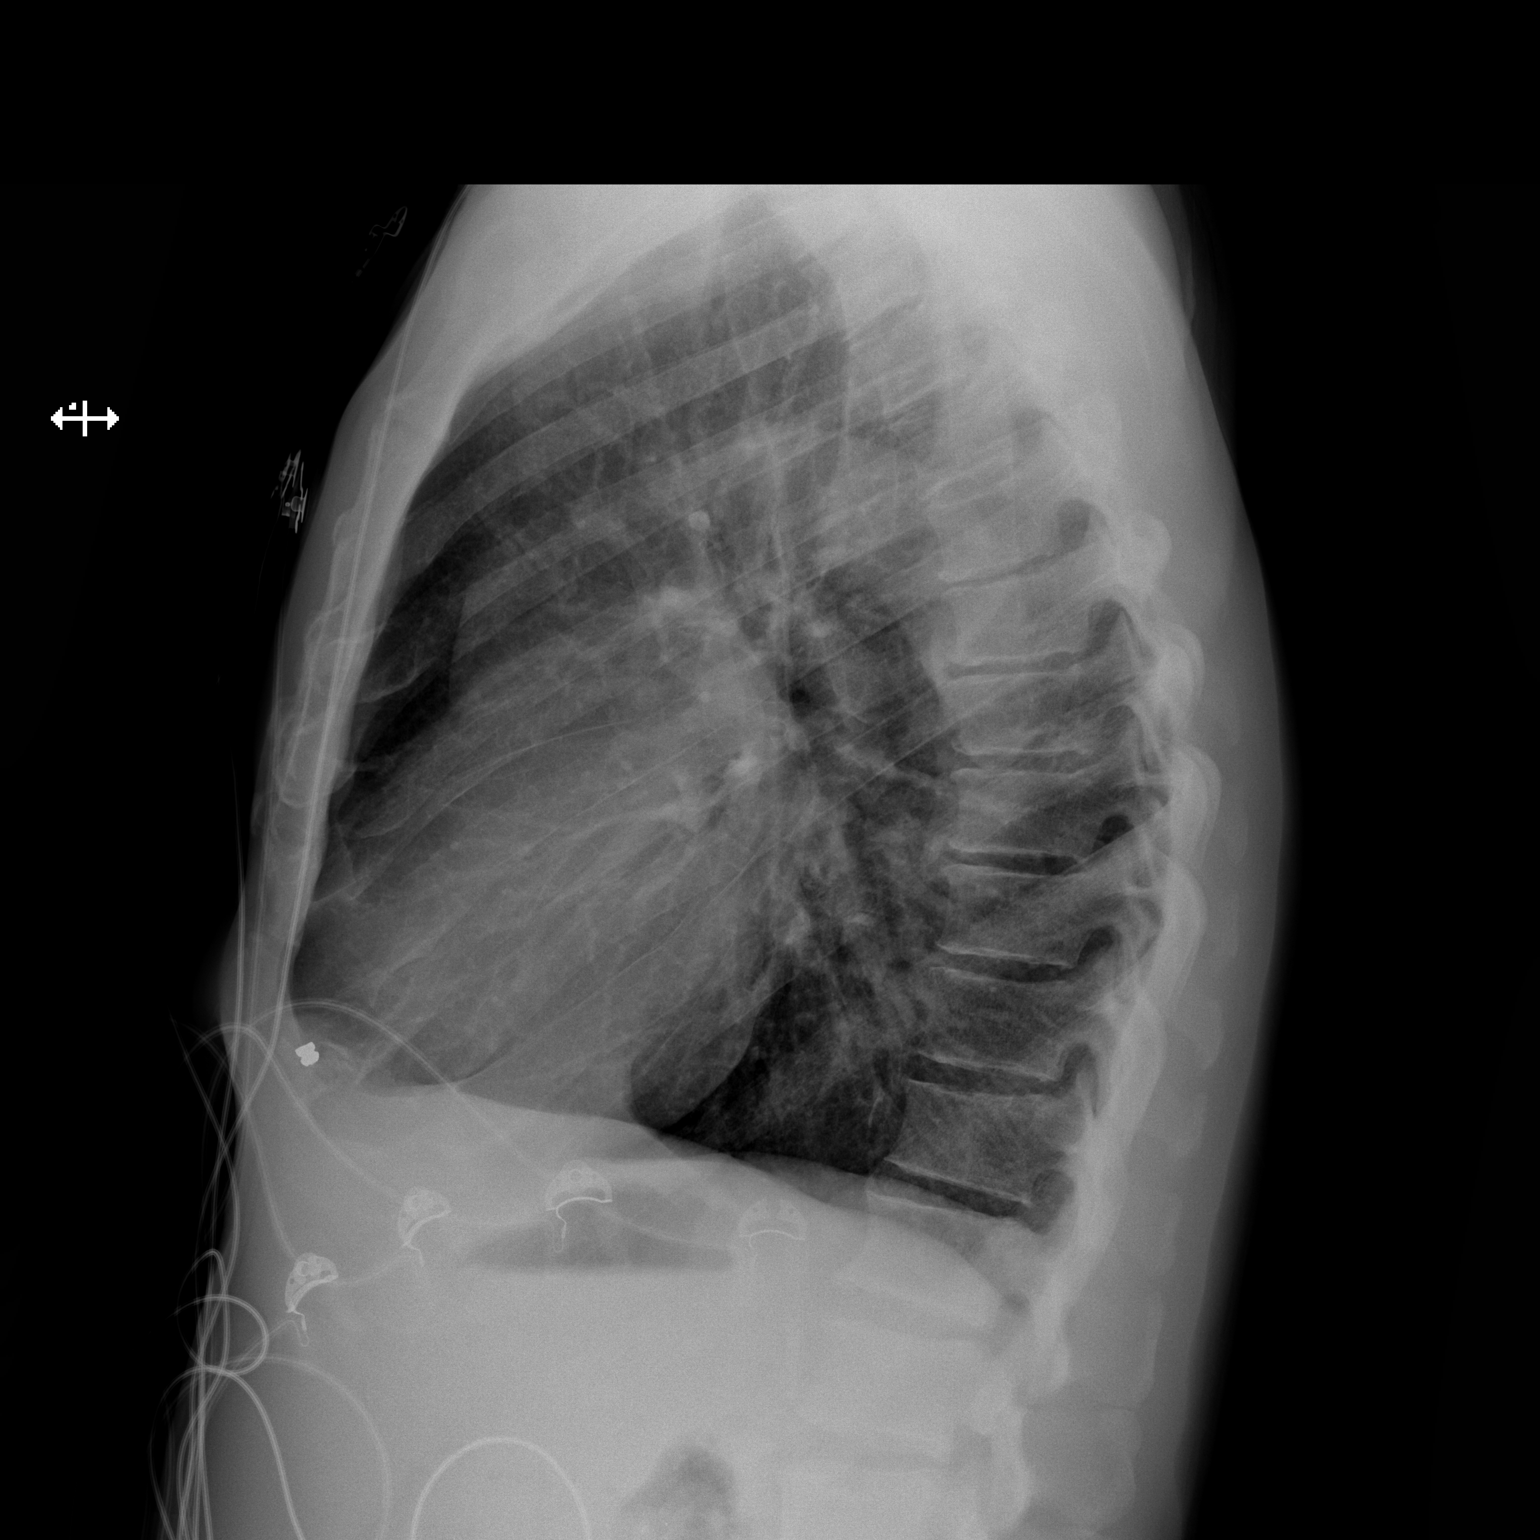

[2 of 2 positions shown; findings below may reference images not displayed]

FINDINGS: Cardiac and mediastinal silhouettes are stable in size and contour,
and remain within normal limits. Atheromatous plaque within the
aortic arch. Intrathoracic aorta is mildly tortuous, stable.

Lung volumes are symmetric and within normal limits. No
pneumothorax. No consolidative airspace opacity. No pulmonary edema
or pleural effusion. Mild diffuse bronchitic changes, similar to
previous.

No acute osseous abnormality. Metallic density overlying the medial
right hemidiaphragm is stable from previous.
IMPRESSION: 1. No active cardiopulmonary disease.
2. Diffuse bronchitic changes, similar to prior.

## 2016-02-09 IMAGING — CT CT HEAD W/O CM
2 series · 15 of 30 positions shown, 19 images · non-contrast
Comparison: CT of the head performed 05/11/2014

CLINICAL DATA: Acute onset of sleepiness. Respiratory distress.
Patient on crack cocaine. Initial encounter.

EXAM:
CT HEAD WITHOUT CONTRAST
TECHNIQUE: Contiguous axial images were obtained from the base of the skull
through the vertex without intravenous contrast.

[Series 2: head w/o · axial · non-contrast · 0.45mm/px · z∈[-137,-17]mm · 13 of 29 slices shown, 17 images]
[im 3/29  brain]
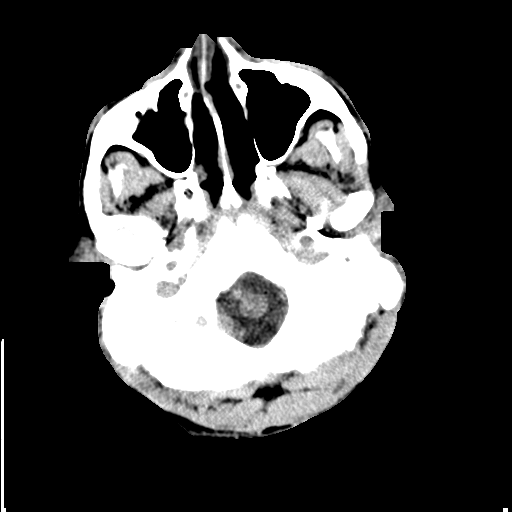
[im 3/29  bone]
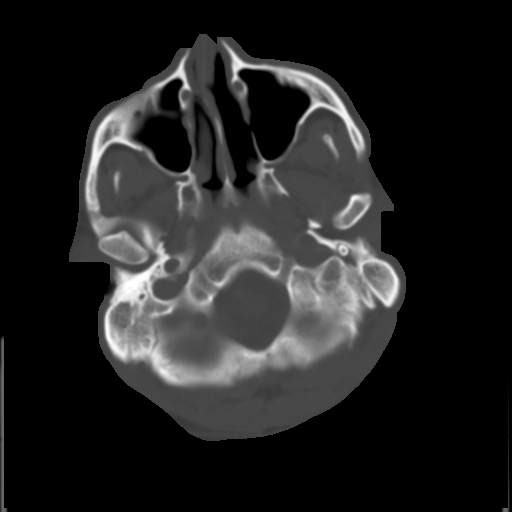
[im 5/29  brain]
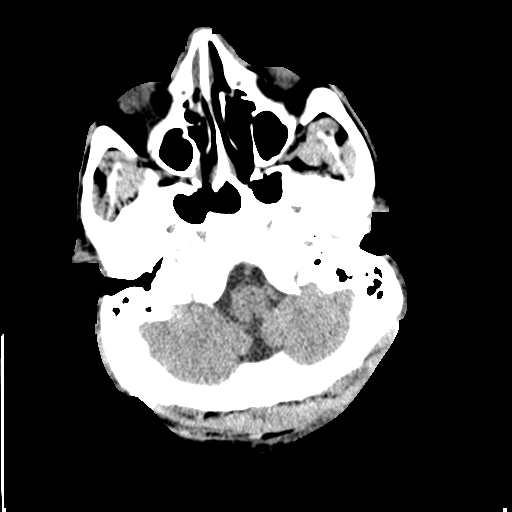
[im 7/29  brain]
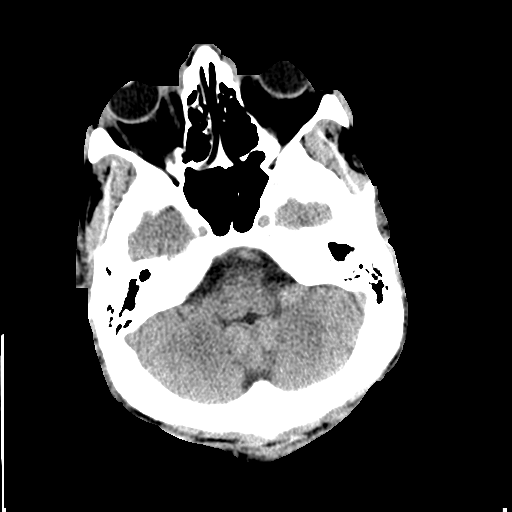
[im 9/29  brain]
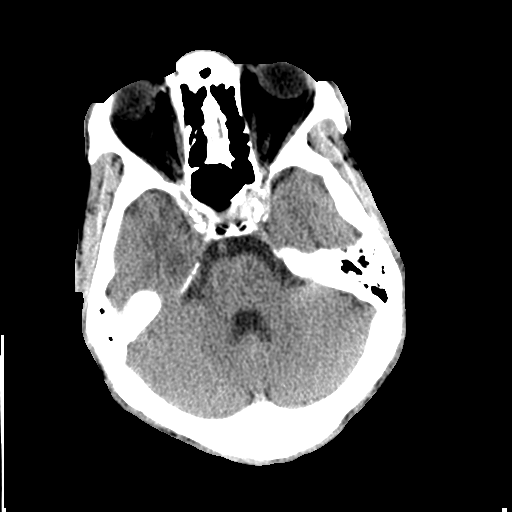
[im 11/29  brain]
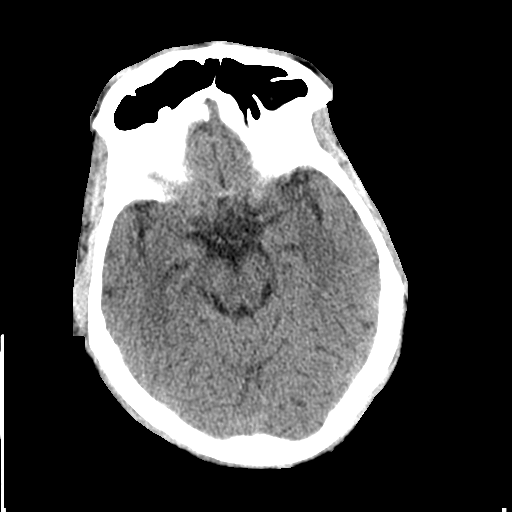
[im 11/29  bone]
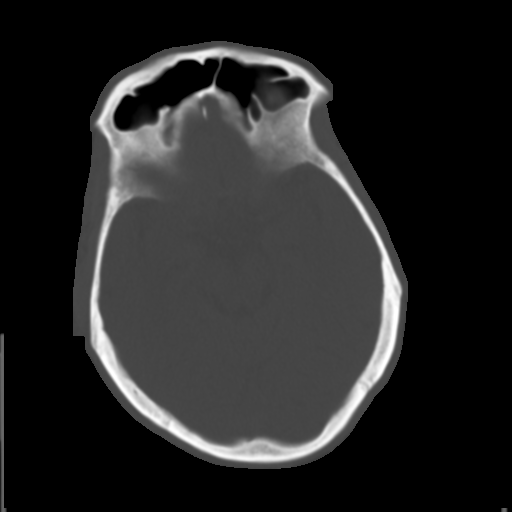
[im 13/29  brain]
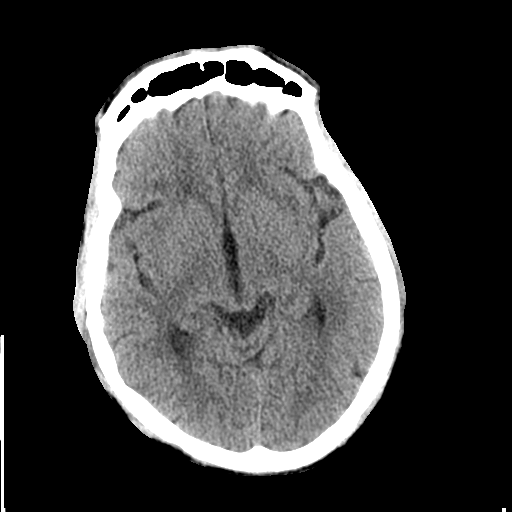
[im 15/29  brain]
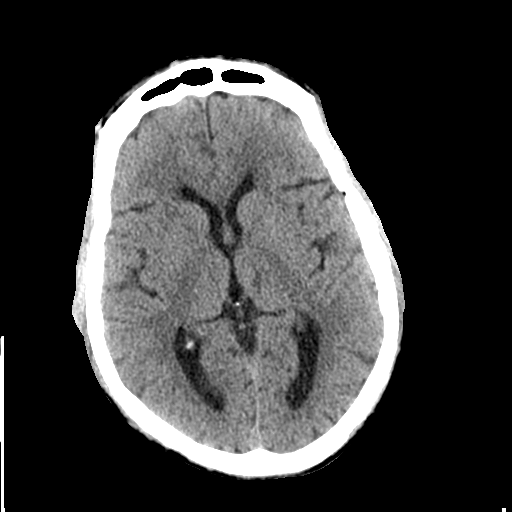
[im 17/29  brain]
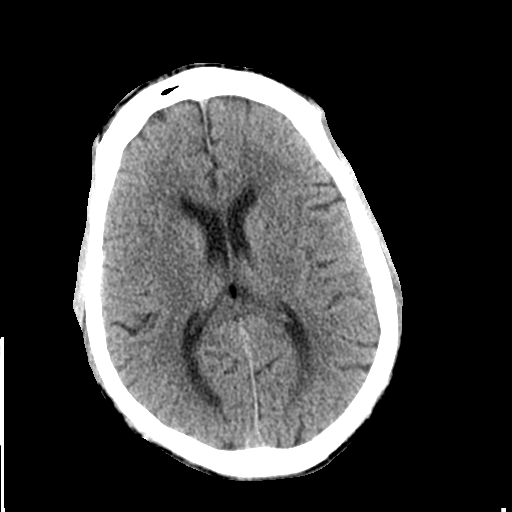
[im 19/29  brain]
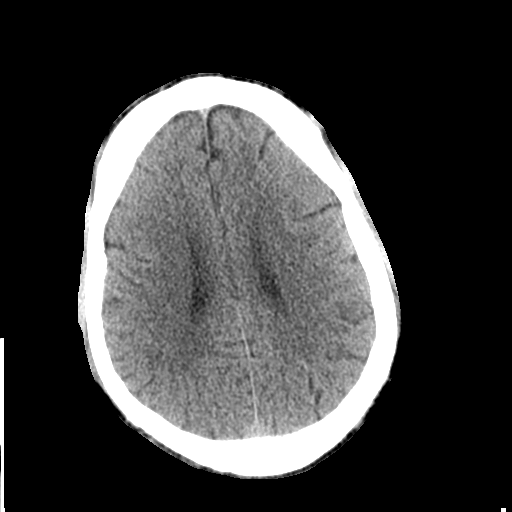
[im 19/29  bone]
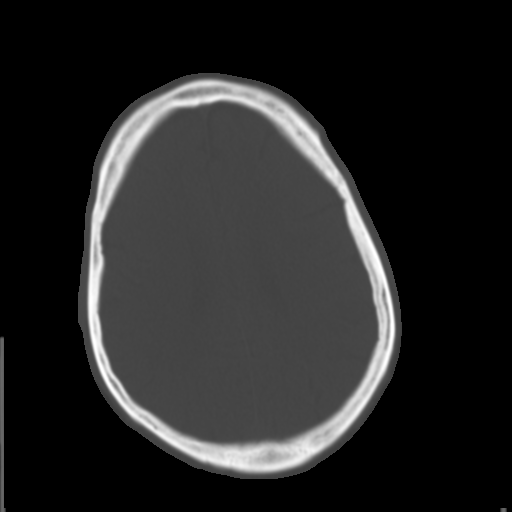
[im 21/29  brain]
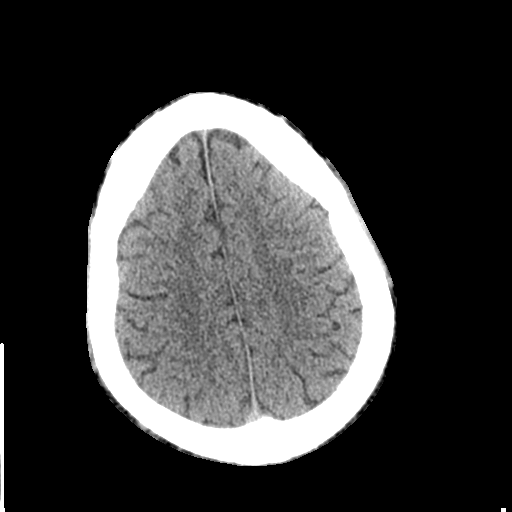
[im 23/29  brain]
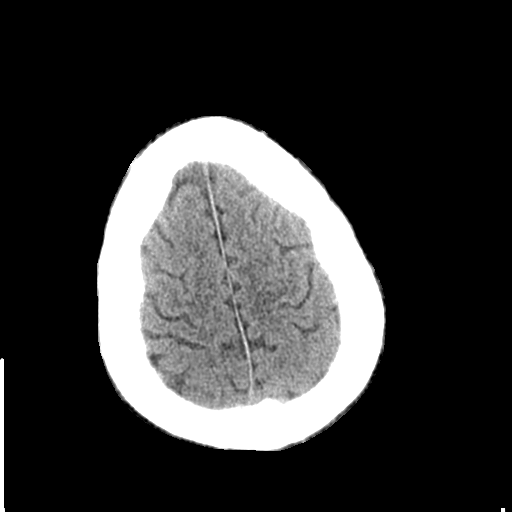
[im 25/29  brain]
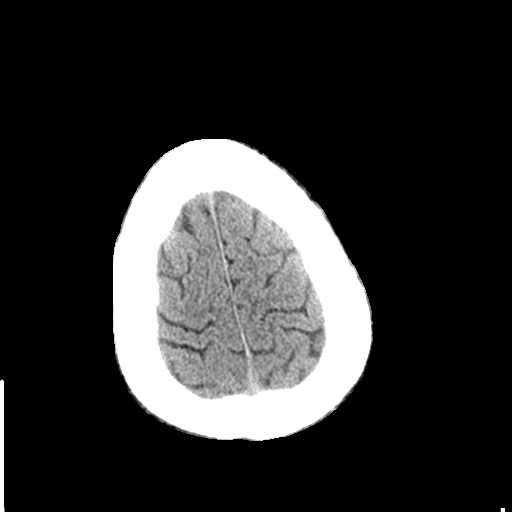
[im 27/29  brain]
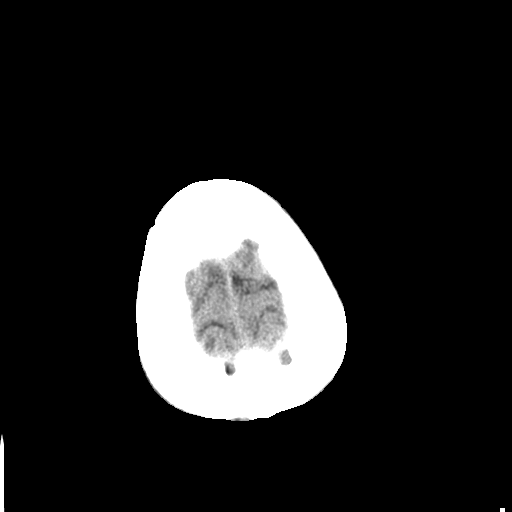
[im 27/29  bone]
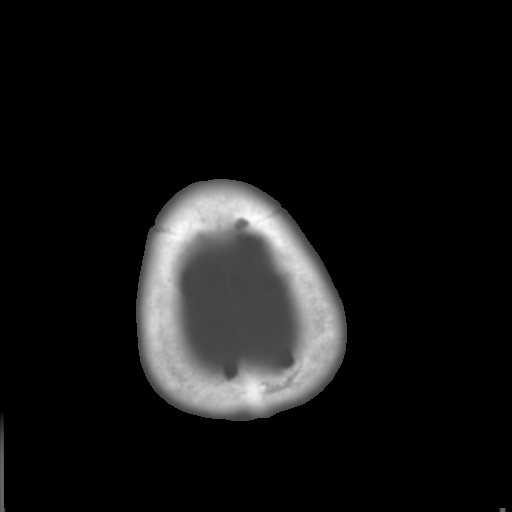

[Series 3: bone windows · axial · 0.45mm/px · z∈[-137,-117]mm · 2 of 29 slices shown]
[im 3/29  bone]
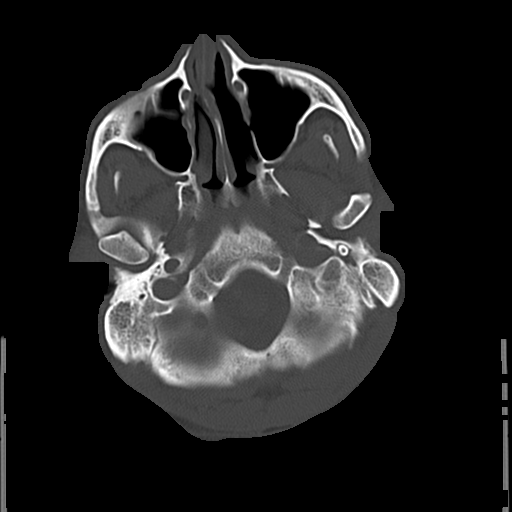
[im 7/29  bone]
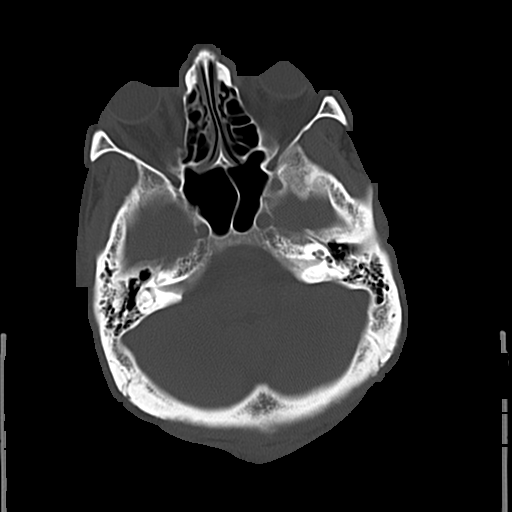

[15 of 30 positions shown; findings below may reference images not displayed]

FINDINGS: There is no evidence of acute infarction, mass lesion, or intra- or
extra-axial hemorrhage on CT.

Mild periventricular white matter change likely reflects small
vessel ischemic microangiopathy.

The posterior fossa, including the cerebellum, brainstem and fourth
ventricle, is within normal limits. The third and lateral
ventricles, and basal ganglia are unremarkable in appearance. The
cerebral hemispheres are symmetric in appearance, with normal
gray-white differentiation. No mass effect or midline shift is seen.

There is no evidence of fracture; visualized osseous structures are
unremarkable in appearance. The orbits are within normal limits. The
paranasal sinuses and mastoid air cells are well-aerated. No
significant soft tissue abnormalities are seen.
IMPRESSION: 1. No acute intracranial pathology seen on CT.
2. Mild small vessel ischemic microangiopathy.

## 2016-09-29 ENCOUNTER — Encounter (HOSPITAL_COMMUNITY): Payer: Self-pay | Admitting: Emergency Medicine

## 2016-09-29 ENCOUNTER — Emergency Department (HOSPITAL_COMMUNITY)
Admission: EM | Admit: 2016-09-29 | Discharge: 2016-09-29 | Disposition: A | Discharge status: Other Acute Inpatient Hospital | Payer: Medicare Other | Attending: Emergency Medicine | Admitting: Emergency Medicine

## 2016-09-29 ENCOUNTER — Inpatient Hospital Stay (HOSPITAL_COMMUNITY)
Admission: AD | Admit: 2016-09-29 | Discharge: 2016-10-04 | DRG: 885 | Disposition: A | Discharge status: Home / Self Care | Payer: Medicare Other | Source: Intra-hospital | Attending: Psychiatry | Admitting: Psychiatry

## 2016-09-29 DIAGNOSIS — F419 Anxiety disorder, unspecified: Secondary | ICD-10-CM | POA: Diagnosis present

## 2016-09-29 DIAGNOSIS — Z79899 Other long term (current) drug therapy: Secondary | ICD-10-CM

## 2016-09-29 DIAGNOSIS — E119 Type 2 diabetes mellitus without complications: Secondary | ICD-10-CM | POA: Diagnosis present

## 2016-09-29 DIAGNOSIS — F1290 Cannabis use, unspecified, uncomplicated: Secondary | ICD-10-CM | POA: Diagnosis not present

## 2016-09-29 DIAGNOSIS — G8929 Other chronic pain: Secondary | ICD-10-CM | POA: Diagnosis present

## 2016-09-29 DIAGNOSIS — Z590 Homelessness: Secondary | ICD-10-CM | POA: Diagnosis not present

## 2016-09-29 DIAGNOSIS — F1490 Cocaine use, unspecified, uncomplicated: Secondary | ICD-10-CM | POA: Diagnosis not present

## 2016-09-29 DIAGNOSIS — E1140 Type 2 diabetes mellitus with diabetic neuropathy, unspecified: Secondary | ICD-10-CM | POA: Insufficient documentation

## 2016-09-29 DIAGNOSIS — F17210 Nicotine dependence, cigarettes, uncomplicated: Secondary | ICD-10-CM | POA: Diagnosis present

## 2016-09-29 DIAGNOSIS — Z888 Allergy status to other drugs, medicaments and biological substances status: Secondary | ICD-10-CM

## 2016-09-29 DIAGNOSIS — F259 Schizoaffective disorder, unspecified: Secondary | ICD-10-CM | POA: Diagnosis present

## 2016-09-29 DIAGNOSIS — F200 Paranoid schizophrenia: Secondary | ICD-10-CM

## 2016-09-29 DIAGNOSIS — E1142 Type 2 diabetes mellitus with diabetic polyneuropathy: Secondary | ICD-10-CM | POA: Diagnosis present

## 2016-09-29 DIAGNOSIS — Z833 Family history of diabetes mellitus: Secondary | ICD-10-CM

## 2016-09-29 DIAGNOSIS — M79673 Pain in unspecified foot: Secondary | ICD-10-CM | POA: Diagnosis present

## 2016-09-29 DIAGNOSIS — F250 Schizoaffective disorder, bipolar type: Secondary | ICD-10-CM | POA: Diagnosis present

## 2016-09-29 DIAGNOSIS — Z8249 Family history of ischemic heart disease and other diseases of the circulatory system: Secondary | ICD-10-CM | POA: Diagnosis not present

## 2016-09-29 DIAGNOSIS — F1420 Cocaine dependence, uncomplicated: Secondary | ICD-10-CM

## 2016-09-29 DIAGNOSIS — I10 Essential (primary) hypertension: Secondary | ICD-10-CM | POA: Diagnosis present

## 2016-09-29 DIAGNOSIS — F14229 Cocaine dependence with intoxication, unspecified: Secondary | ICD-10-CM | POA: Diagnosis present

## 2016-09-29 DIAGNOSIS — I110 Hypertensive heart disease with heart failure: Secondary | ICD-10-CM | POA: Insufficient documentation

## 2016-09-29 DIAGNOSIS — R45851 Suicidal ideations: Secondary | ICD-10-CM

## 2016-09-29 DIAGNOSIS — I509 Heart failure, unspecified: Secondary | ICD-10-CM | POA: Insufficient documentation

## 2016-09-29 LAB — RAPID URINE DRUG SCREEN, HOSP PERFORMED
AMPHETAMINES: NOT DETECTED
Barbiturates: NOT DETECTED
Benzodiazepines: NOT DETECTED
Cocaine: POSITIVE — AB
OPIATES: NOT DETECTED
TETRAHYDROCANNABINOL: NOT DETECTED

## 2016-09-29 LAB — BASIC METABOLIC PANEL
Anion gap: 9 (ref 5–15)
BUN: 15 mg/dL (ref 6–20)
CHLORIDE: 108 mmol/L (ref 101–111)
CO2: 27 mmol/L (ref 22–32)
Calcium: 9.4 mg/dL (ref 8.9–10.3)
Creatinine, Ser: 0.75 mg/dL (ref 0.61–1.24)
GFR calc Af Amer: >60 mL/min (ref >60)
GFR calc non Af Amer: >60 mL/min (ref >60)
GLUCOSE: 164 mg/dL — AB (ref 65–99)
POTASSIUM: 3.5 mmol/L (ref 3.5–5.1)
Sodium: 144 mmol/L (ref 135–145)

## 2016-09-29 LAB — CBC WITH DIFFERENTIAL/PLATELET
Basophils Absolute: 0 10*3/uL (ref 0.0–0.1)
Basophils Relative: 0 %
Eosinophils Absolute: 0 10*3/uL (ref 0.0–0.7)
Eosinophils Relative: 0 %
HEMATOCRIT: 37.5 % — AB (ref 39.0–52.0)
Hemoglobin: 11.8 g/dL — ABNORMAL LOW (ref 13.0–17.0)
LYMPHS ABS: 1.3 10*3/uL (ref 0.7–4.0)
LYMPHS PCT: 17 %
MCH: 28 pg (ref 26.0–34.0)
MCHC: 31.5 g/dL (ref 30.0–36.0)
MCV: 88.9 fL (ref 78.0–100.0)
Monocytes Absolute: 0.6 10*3/uL (ref 0.1–1.0)
Monocytes Relative: 8 %
NEUTROS PCT: 75 %
Neutro Abs: 5.5 10*3/uL (ref 1.7–7.7)
Platelets: 255 10*3/uL (ref 150–400)
RBC: 4.22 MIL/uL (ref 4.22–5.81)
RDW: 13.1 % (ref 11.5–15.5)
WBC: 7.4 10*3/uL (ref 4.0–10.5)

## 2016-09-29 LAB — CBG MONITORING, ED: Glucose-Capillary: 148 mg/dL — ABNORMAL HIGH (ref 65–99)

## 2016-09-29 LAB — ACETAMINOPHEN LEVEL

## 2016-09-29 LAB — ETHANOL

## 2016-09-29 LAB — SALICYLATE LEVEL: Salicylate Lvl: <7 mg/dL (ref 2.8–30.0)

## 2016-09-29 MED ORDER — ACETAMINOPHEN 325 MG PO TABS
650.0000 mg | ORAL_TABLET | Freq: Four times a day (QID) | ORAL | Status: DC | PRN
  Administered 2016-09-29 – 2016-10-04 (×11): 650 mg via ORAL
  Filled 2016-09-29 (×11): qty 2

## 2016-09-29 MED ORDER — LORAZEPAM 2 MG/ML IJ SOLN
INTRAMUSCULAR | Status: AC
  Filled 2016-09-29: qty 1

## 2016-09-29 MED ORDER — LORAZEPAM 2 MG/ML IJ SOLN: 1.0000 mg | Freq: Once | INTRAMUSCULAR | Status: DC

## 2016-09-29 MED ORDER — OLANZAPINE 5 MG PO TBDP: 5.0000 mg | ORAL_TABLET | Freq: Two times a day (BID) | ORAL | Status: DC | PRN

## 2016-09-29 MED ORDER — OLANZAPINE 10 MG PO TBDP
10.0000 mg | ORAL_TABLET | Freq: Every day | ORAL | Status: DC
  Administered 2016-09-29 – 2016-09-30 (×2): 10 mg via ORAL
  Filled 2016-09-29 (×4): qty 1

## 2016-09-29 MED ORDER — ZIPRASIDONE MESYLATE 20 MG IM SOLR
INTRAMUSCULAR | Status: AC
  Administered 2016-09-29: 20 mg via INTRAMUSCULAR
  Filled 2016-09-29: qty 20

## 2016-09-29 MED ORDER — METFORMIN HCL 500 MG PO TABS
500.0000 mg | ORAL_TABLET | Freq: Every day | ORAL | Status: DC
  Administered 2016-09-29: 500 mg via ORAL
  Filled 2016-09-29: qty 1

## 2016-09-29 MED ORDER — ZIPRASIDONE MESYLATE 20 MG IM SOLR
20.0000 mg | Freq: Once | INTRAMUSCULAR | Status: AC
  Administered 2016-09-29: 20 mg via INTRAMUSCULAR
  Filled 2016-09-29: qty 20

## 2016-09-29 MED ORDER — MAGNESIUM HYDROXIDE 400 MG/5ML PO SUSP
30.0000 mL | Freq: Every day | ORAL | Status: DC | PRN
  Administered 2016-10-01 – 2016-10-03 (×2): 30 mL via ORAL
  Filled 2016-09-29 (×2): qty 30

## 2016-09-29 MED ORDER — ALUM & MAG HYDROXIDE-SIMETH 200-200-20 MG/5ML PO SUSP: 15.0000 mL | ORAL | Status: DC | PRN

## 2016-09-29 MED ORDER — DIPHENHYDRAMINE HCL 50 MG/ML IJ SOLN
INTRAMUSCULAR | Status: AC
  Administered 2016-09-29: 50 mg
  Filled 2016-09-29: qty 1

## 2016-09-29 MED ORDER — LORAZEPAM 2 MG/ML IJ SOLN
2.0000 mg | Freq: Once | INTRAMUSCULAR | Status: AC
  Administered 2016-09-29: 2 mg via INTRAVENOUS

## 2016-09-29 NOTE — Progress Notes (Addendum)
  DATA ACTION RESPONSE  Objective- Pt. is visible in his room, just woke up from a  nap. Presents with a depressed/anxious affect and mood. Subjective- Denies having any SI/HI/AVH at this time. Rates pain 6/10 in bilat foot. Pt. states " I need to stay away from cocaine and do this for myself. I knew that guy from the streets". Continues to remain safe on the unit. Needs re-direction at times but is receptive. Pt. Is  forgetful and need reminders.   1:1 interaction in private to establish rapport. Encouragement, education, & support given from staff. Meds. ordered and administered. PRN Tylenol  requested and will re-eval accordingly.   Safety maintained with Q 15 checks. Continues to follow treatment plan and will monitor closely. No additonal questions/concerns noted.

## 2016-09-29 NOTE — ED Notes (Signed)
Ordered regular breakfast tray.  

## 2016-09-29 NOTE — [facility] Group Notes (Signed)
[facility] Group Notes:  (Clinical Social Work)  09/29/2016  11:00AM-12:00PM  Summary of Progress/Problems:  The main focus of today's process group was to listen to a variety of genres of music and to identify that different types of music provoke different responses.  The patient then was able to identify personally what was soothing for them, as well as energizing, as well as how patient can personally use this knowledge in sleep habits, with depression, and with other symptoms.  The patient expressed at the beginning of group the overall feeling of "better, great."  He responded to internal stimuli as long as he was in the room, although he smiled and was calm while doing so.  He then got up and walked around a minute, left the room with an apology.  Within a few minutes, he was heard yelling in the hallway to the extent staff responded.  He did not return to group.  Type of Therapy:  Music Therapy   Participation Level:  Minimal  Participation Quality:  Inattentive  Affect:  Blunted  Cognitive:  Confused, Disorganized, Hallucinating, Delusional  Insight:  None  Engagement in Therapy:  Poor  Modes of Intervention:   Activity, Exploration  Taylor Mantle, LCSW 09/29/2016

## 2016-09-29 NOTE — ED Notes (Addendum)
Pt accepted to [facility], and may be transported after 0800. Dr. Jama Flavors is receiving. Pt assigned to Room 402-2.

## 2016-09-29 NOTE — ED Notes (Signed)
Pt given OJ and graham crackers upon request. Reviewed psych hold rules on snack and meal times, and reinforced that the only other thing he could get before breakfast was water & crackers. Pt voiced understanding and agreement.

## 2016-09-29 NOTE — ED Provider Notes (Signed)
[facility]-EMERGENCY DEPT Provider Note   CSN: [id number] Arrival date & time: 09/29/16  0100  By signing my name below, I, Elder Negus, attest that this documentation has been prepared under the direction and in the presence of Shon Baton, MD. Electronically Signed: Elder Negus, Scribe. 09/29/16. 1:38 AM.   History   Chief Complaint Chief Complaint  Patient presents with  . Suicidal    HPI [name] is a 56 y.o. male with history of CHF, cocaine use, diabetes, schizophrenia, and homelessness who presents to the ED for medication refill with suicidality. This patient states that he was recently released from prison 2 days ago. He was previously taking anti-psychotic therapies, but apparently he "left them at a group home and he doesn't want anything to do with them". He was brought to this facility after voicing suicidal intent to EMS. He denies any specific plan now. No alcohol or drug use today. No self harm. No somatic complaints including chest pain or dyspnea.   The history is provided by the patient. No language interpreter was used.    Past Medical History:  Diagnosis Date  . CHF (congestive heart failure) (HCC)   . Chronic foot pain   . Cocaine abuse   . Diabetes mellitus without complication (HCC)   . Hepatitis C   . Homelessness   . Homelessness   . Hypertension   . Neuropathy (HCC)   . Polysubstance abuse   . Schizophrenia [facility])     Patient Active Problem List   Diagnosis Date Noted  . Suicidal ideations   . Substance induced mood disorder (HCC) 03/13/2015  . Acute kidney failure (HCC) 01/26/2015  . Schizophrenia, paranoid type (HCC) 01/17/2015  . Suicidal ideation   . Drug hallucinosis (HCC) 10/08/2014  . Chronic paranoid schizophrenia (HCC) 09/07/2014  . 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 Acute CHF (congestive heart failure) (HCC) 07/29/2014  . Cocaine use  disorder, severe, dependence (HCC)   . Essential hypertension, benign 03/28/2013  . Diabetes mellitus (HCC) 03/15/2013    History reviewed. No pertinent surgical history.     Home Medications    Prior to Admission medications   Medication Sig Start Date End Date Taking? Authorizing Provider  amantadine (SYMMETREL) 100 MG capsule Take 1 capsule (100 mg total) by mouth 2 (two) times daily. Patient not taking: Reported on 07/25/2015 06/07/15   Earney Navy, NP  amantadine (SYMMETREL) 100 MG capsule Take 1 capsule (100 mg total) by mouth 2 (two) times daily. Patient not taking: Reported on 07/28/2015 07/22/15   Kristeen Mans, NP  chlorproMAZINE (THORAZINE) 50 MG tablet Take 1 tablet (50 mg total) by mouth daily. Patient not taking: Reported on 07/28/2015 07/22/15   Kristeen Mans, NP  fluticasone [facility]) 50 MCG/ACT nasal spray Place 2 sprays into both nostrils daily. 07/31/15   Shon Baton, MD    Family History Family History  Problem Relation Age of Onset  . Hypertension Other   . Diabetes Other     Social History Social History  Substance Use Topics  . Smoking status: Current Every Day Smoker    Packs/day: 1.00    Years: 20.00    Types: Cigarettes  . Smokeless tobacco: Current User  . Alcohol use Yes     Comment: Daily Drinker      Allergies   Haldol [haloperidol]   Review of Systems Review of Systems  Respiratory: Negative for shortness of breath.  Cardiovascular: Negative for chest pain.  Psychiatric/Behavioral: Positive for suicidal ideas.  All other systems reviewed and are negative.    Physical Exam Updated Vital Signs BP (!) 160/92 (BP Location: Right Arm)   Pulse 80   Temp 97.7 F (36.5 C) (Oral)   Resp 18   SpO2 97%   Physical Exam  Constitutional: He is oriented to person, place, and time.  Anxious appearing  HENT:  Head: Normocephalic and atraumatic.  Cardiovascular: Normal rate and regular rhythm.   Pulmonary/Chest: Effort normal.  No respiratory distress.  Neurological: He is alert and oriented to person, place, and time.  Skin: Skin is warm and dry.  Psychiatric: He has a normal mood and affect.  Appears anxious, intermittently agitated  Nursing note and vitals reviewed.    ED Treatments / Results  Labs (all labs ordered are listed, but only abnormal results are displayed) Labs Reviewed  CBC WITH DIFFERENTIAL/PLATELET - Abnormal; Notable for the following:       Result Value   Hemoglobin 11.8 (*)    HCT 37.5 (*)    All other components within normal limits  BASIC METABOLIC PANEL - Abnormal; Notable for the following:    Glucose, Bld 164 (*)    All other components within normal limits  RAPID URINE DRUG SCREEN, HOSP PERFORMED - Abnormal; Notable for the following:    Cocaine POSITIVE (*)    All other components within normal limits  ACETAMINOPHEN LEVEL - Abnormal; Notable for the following:    Acetaminophen (Tylenol), Serum <10 (*)    All other components within normal limits  ETHANOL  SALICYLATE LEVEL    EKG  EKG Interpretation None       Radiology No results found.  Procedures Procedures (including critical care time)  Medications Ordered in ED Medications - No data to display   Initial Impression / Assessment and Plan / ED Course  I have reviewed the triage vital signs and the nursing notes.  Pertinent labs & imaging results that were available during my care of the patient were reviewed by me and considered in my medical decision making (see chart for details).     Patient with history of schizophrenia. Presents with suicidal ideation. Fairly vague with plan. He is medically clear. Patient accepted to [facility] tomorrow morning.  Final Clinical Impressions(s) / ED Diagnoses   Final diagnoses:  Suicidal ideation    New Prescriptions New Prescriptions   No medications on file   I personally performed the services described in this documentation, which was scribed in  my presence. The recorded information has been reviewed and is accurate.    Shon Baton, MD 09/29/16 [id number]

## 2016-09-29 NOTE — ED Notes (Signed)
Dr. Ranae Palms aware of CBG.

## 2016-09-29 NOTE — ED Triage Notes (Signed)
EMS said pt stated he wanted to kill himself.

## 2016-09-29 NOTE — ED Notes (Signed)
TTS at bedside. 

## 2016-09-29 NOTE — ED Notes (Signed)
[facility] contacted for transport 

## 2016-09-29 NOTE — ED Notes (Signed)
Pt has eaten all of breakfast tray.Marland Kitchen

## 2016-09-29 NOTE — H&P (Signed)
Psychiatric Admission Assessment Adult  Patient Identification: [name]  MRN:  [id number]  Date of Evaluation:  09/29/2016  Chief Complaint:  schzoaffective disorder  Principal Diagnosis: Schizophrenia, Cocaine intoxication.  Diagnosis:   Patient Active Problem List   Diagnosis Date Noted  . Chronic paranoid schizophrenia ([place]) [F20.0] 09/07/2014    Priority: High  . Cocaine use disorder, severe, dependence ([place]) [F14.20]     Priority: Medium  . Suicidal ideations [R45.851]   . Substance induced mood disorder ([place]) [F19.94] 03/13/2015  . Acute kidney failure ([place]) [N17.9] 01/26/2015  . Schizophrenia, paranoid type ([place]) [F20.0] 01/17/2015  . Suicidal ideation [R45.851]   . Drug hallucinosis ([place]) [F19.951] 10/08/2014  . Substance or medication-induced bipolar and related disorder with onset during intoxication ([place]) [F19.94] 08/10/2014  . Acute CHF (congestive heart failure) ([place]) [I50.9] 07/29/2014  . Essential hypertension, benign [I10] 03/28/2013  . Diabetes mellitus ([place]) [E11.9] 03/15/2013   History of Present Illness: (Per TTS): [name] is an 56 y.o. male who presents unaccompanied to [facility] ED after voicing suicidal ideation to EMS. Pt reports he was released from prison after being convicted of breaking and entering into a vehicle and [place] offender database indicates he was incarcerated from 08/28/15-09/24/16. Pt says he is currently homeless and has no place to go. He says he is very depressed with symptoms including  social withdrawal, loss of interest in usual pleasures, fatigue, decreased concentration, decreased sleep and feelings of hopelessness. Pt denies current auditory or visual hallucinations however during assessment Pt at times appears to be mumbling to himself and looking about the room. He reports suicidal ideation with no specific plan. Pt denies any history of suicide attempts.  Per Md's SRA: 56 yo AAM with background history of  schizophrenia. Presented to the ER via emergency services. Stated to have expressed suicidal thoughts. Very agitated and internally preoccupied on arrival to the [facility] adult unit. Threatening and required emergency medications. UDS positive for cocaine  Associated Signs/Symptoms:  Depression Symptoms:  depressed mood, psychomotor agitation, difficulty concentrating, anxiety,  (Hypo) Manic Symptoms:  Elevated Mood, Impulsivity, Irritable Mood, Labiality of Mood,  Anxiety Symptoms:  Restlessness  Psychotic Symptoms:  Paranoia,  PTSD Symptoms: NA  Total Time spent with patient: 1 hour  Past Psychiatric History: Cocaine use disorder, Schizoaffective disorder.  Is the patient at risk to self? Yes.    Has the patient been a risk to self in the past 6 months? No.  Has the patient been a risk to self within the distant past? Yes.    Is the patient a risk to others? Yes.    Has the patient been a risk to others in the past 6 months? No.  Has the patient been a risk to others within the distant past? Yes.     Prior Inpatient Therapy: Yes Prior Outpatient Therapy: Yes  Alcohol Screening:  Substance Abuse History in the last 12 months:  Yes.    Consequences of Substance Abuse: Medical Consequences:  Liver damage, Possible death by overdose Legal Consequences:  Arrests, jail time, Loss of driving privilege. Family Consequences:  Family discord, divorce and or separation.  Previous Psychotropic Medications: Yes   Psychological Evaluations: No   Past Medical History:  Past Medical History:  Diagnosis Date  . CHF (congestive heart failure) ([place])   . Chronic foot pain   . Cocaine abuse   . Diabetes mellitus without complication ([place])   . Hepatitis C   . Homelessness   . Homelessness   .  Hypertension   . Neuropathy ([place])   . Polysubstance abuse   . Schizophrenia ([place])    History reviewed. No pertinent surgical history.  Family History:  Family History  Problem Relation  Age of Onset  . Hypertension Other   . Diabetes Other    Family Psychiatric  History:   Tobacco Screening:    Social History:  History  Alcohol Use  . Yes    Comment: Daily Drinker      History  Drug Use  . Frequency: 7.0 times per week  . Types: "Crack" cocaine, Cocaine, Marijuana    Comment: Cocaine tonight, Marijuana "a long time"    Additional Social History:  Allergies:   Allergies  Allergen Reactions  . Haldol [Haloperidol] Other (See Comments)    Muscle spasms, loss of voluntary movement. However, pt has taken Thorazine on multiple occasions with no adverse effects.    Lab Results:  Results for orders placed or performed during the hospital encounter of 09/29/16 (from the past 48 hour(s))  CBC with Differential     Status: Abnormal   Collection Time: 09/29/16  1:36 AM  Result Value Ref Range   WBC 7.4 4.0 - 10.5 K/uL   RBC 4.22 4.22 - 5.81 MIL/uL   Hemoglobin 11.8 (L) 13.0 - 17.0 g/dL   HCT 37.5 (L) 39.0 - 52.0 %   MCV 88.9 78.0 - 100.0 fL   MCH 28.0 26.0 - 34.0 pg   MCHC 31.5 30.0 - 36.0 g/dL   RDW 13.1 11.5 - 15.5 %   Platelets 255 150 - 400 K/uL   Neutrophils Relative % 75 %   Neutro Abs 5.5 1.7 - 7.7 K/uL   Lymphocytes Relative 17 %   Lymphs Abs 1.3 0.7 - 4.0 K/uL   Monocytes Relative 8 %   Monocytes Absolute 0.6 0.1 - 1.0 K/uL   Eosinophils Relative 0 %   Eosinophils Absolute 0.0 0.0 - 0.7 K/uL   Basophils Relative 0 %   Basophils Absolute 0.0 0.0 - 0.1 K/uL  Basic metabolic panel     Status: Abnormal   Collection Time: 09/29/16  1:36 AM  Result Value Ref Range   Sodium 144 135 - 145 mmol/L   Potassium 3.5 3.5 - 5.1 mmol/L   Chloride 108 101 - 111 mmol/L   CO2 27 22 - 32 mmol/L   Glucose, Bld 164 (H) 65 - 99 mg/dL   BUN 15 6 - 20 mg/dL   Creatinine, Ser 0.75 0.61 - 1.24 mg/dL   Calcium 9.4 8.9 - 10.3 mg/dL   GFR calc non Af Amer >60 >60 mL/min   GFR calc Af Amer >60 >60 mL/min    Comment: (NOTE) The eGFR has been calculated using the CKD  EPI equation. This calculation has not been validated in all clinical situations. eGFR's persistently <60 mL/min signify possible Chronic Kidney Disease.    Anion gap 9 5 - 15  Rapid urine drug screen (hospital performed)     Status: Abnormal   Collection Time: 09/29/16  1:37 AM  Result Value Ref Range   Opiates NONE DETECTED NONE DETECTED   Cocaine POSITIVE (A) NONE DETECTED   Benzodiazepines NONE DETECTED NONE DETECTED   Amphetamines NONE DETECTED NONE DETECTED   Tetrahydrocannabinol NONE DETECTED NONE DETECTED   Barbiturates NONE DETECTED NONE DETECTED    Comment:        DRUG SCREEN FOR MEDICAL PURPOSES ONLY.  IF CONFIRMATION IS NEEDED FOR ANY PURPOSE, NOTIFY LAB WITHIN 5 DAYS.  LOWEST DETECTABLE LIMITS FOR URINE DRUG SCREEN Drug Class       Cutoff (ng/mL) Amphetamine      1000 Barbiturate      200 Benzodiazepine   219 Tricyclics       758 Opiates          300 Cocaine          300 THC              50   Ethanol     Status: None   Collection Time: 09/29/16  1:37 AM  Result Value Ref Range   Alcohol, Ethyl (B) <5 <5 mg/dL    Comment:        LOWEST DETECTABLE LIMIT FOR SERUM ALCOHOL IS 5 mg/dL FOR MEDICAL PURPOSES ONLY   Acetaminophen level     Status: Abnormal   Collection Time: 09/29/16  1:37 AM  Result Value Ref Range   Acetaminophen (Tylenol), Serum <10 (L) 10 - 30 ug/mL    Comment:        THERAPEUTIC CONCENTRATIONS VARY SIGNIFICANTLY. A RANGE OF 10-30 ug/mL MAY BE AN EFFECTIVE CONCENTRATION FOR MANY PATIENTS. HOWEVER, SOME ARE BEST TREATED AT CONCENTRATIONS OUTSIDE THIS RANGE. ACETAMINOPHEN CONCENTRATIONS >150 ug/mL AT 4 HOURS AFTER INGESTION AND >50 ug/mL AT 12 HOURS AFTER INGESTION ARE OFTEN ASSOCIATED WITH TOXIC REACTIONS.   Salicylate level     Status: None   Collection Time: 09/29/16  1:37 AM  Result Value Ref Range   Salicylate Lvl <8.3 2.8 - 30.0 mg/dL  CBG monitoring, ED     Status: Abnormal   Collection Time: 09/29/16  9:36 AM   Result Value Ref Range   Glucose-Capillary 148 (H) 65 - 99 mg/dL   Blood Alcohol level:  Lab Results  Component Value Date   ETH <5 09/29/2016   ETH <5 25/49/8264   Metabolic Disorder Labs:  Lab Results  Component Value Date   HGBA1C NWBR 04/14/2015   MPG 128 07/29/2014   MPG 194 (H) 03/14/2013   No results found for: PROLACTIN Lab Results  Component Value Date   CHOL 165 01/21/2015   TRIG 64 01/21/2015   HDL 61 01/21/2015   CHOLHDL 2.7 01/21/2015   VLDL 13 01/21/2015   LDLCALC 91 01/21/2015   LDLCALC 87 08/12/2014   Current Medications: Current Facility-Administered Medications  Medication Dose Route Frequency Provider Last Rate Last Dose  . LORazepam (ATIVAN) injection 1 mg  1 mg Intravenous Once Encarnacion Slates, NP      . OLANZapine zydis (ZYPREXA) disintegrating tablet 10 mg  10 mg Oral QHS Vincent A Izediuno, MD      . OLANZapine zydis (ZYPREXA) disintegrating tablet 5 mg  5 mg Oral BID BM PRN Artist Beach, MD       PTA Medications: Prescriptions Prior to Admission  Medication Sig Dispense Refill Last Dose  . amantadine (SYMMETREL) 100 MG capsule Take 1 capsule (100 mg total) by mouth 2 (two) times daily. (Patient not taking: Reported on 07/28/2015) 60 capsule 0 Not Taking at Unknown time  . chlorproMAZINE (THORAZINE) 50 MG tablet Take 1 tablet (50 mg total) by mouth daily. 30 tablet 0 Past Week at Unknown time   Musculoskeletal: Strength & Muscle Tone: within normal limits Gait & Station: normal Patient leans: Right  Psychiatric Specialty Exam: Physical Exam  Constitutional: He appears well-developed.  HENT:  Head: Normocephalic.  Eyes: Pupils are equal, round, and reactive to light.  Neck: Normal range of motion.  Cardiovascular: Normal rate.  Respiratory: Effort normal.  GI: Soft.  Genitourinary:  Genitourinary Comments: Deferred  Musculoskeletal: Normal range of motion.  Neurological: He is alert.  Skin: Skin is warm.    Review of Systems   Constitutional: Negative.   HENT: Negative.   Eyes: Negative.   Respiratory: Negative.   Cardiovascular: Negative.   Gastrointestinal: Negative.   Genitourinary: Negative.   Musculoskeletal: Negative.   Skin: Negative.   Neurological: Negative.   Endo/Heme/Allergies: Negative.   Psychiatric/Behavioral: Positive for depression, hallucinations, substance abuse (Cocaine use disorder) and suicidal ideas. Negative for memory loss. The patient is nervous/anxious and has insomnia.     Blood pressure 136/84, pulse 73, temperature 99 F (37.2 C), temperature source Oral, resp. rate 16, height '5\' 8"'  (1.727 m), weight 76.2 kg (168 lb), SpO2 97 %.Body mass index is 25.54 kg/m.   Contact:  Limited because he is distracted by internal stimuli.   Speech:  mumble and then yells  Volume:  Loud  Mood:  Dysphoric  Affect:  odd and has a psychotic quality  Thought Process:  Disorganized  Orientation:  Unable to assess  Thought Content:  Unable to assess  Suicidal Thoughts:  Yes.  with intent/plan  Homicidal Thoughts:  Unable to assess  Memory:  Unable to assess  Judgement:  Poor  Insight:  Poor  Psychomotor Activity:  Increased  Concentration:  Distracted by internal stimuli  Recall:  Unable to assess  Fund of Knowledge:  Poor  Language:  Poor  Akathisia:    Handed:    AIMS (if indicated):     Assets:  Unable to assess  ADL's:  Impaired  Cognition:  Impaired,  Moderate  Sleep:        Treatment Plan/Recommendations: 1. Admit for crisis management and stabilization, estimated length of stay 3-5 days.   2. Medication management to reduce current symptoms to base line and improve the patient's overall level of functioning: See Md's SRA & treatment & MAR.   3. Treat health problems as indicated.  4. Develop treatment plan to decrease risk of relapse upon discharge and the need for readmission.  5. Psycho-social education regarding relapse prevention and self care.  6. Health care follow up  as needed for medical problems.  7. Review, reconcile, and reinstate any pertinent home medications for other health issues where appropriate. 8. Call for consults with hospitalist for any additional specialty patient care services as needed.  Observation Level/Precautions:  15 minute checks  Laboratory:  Per ED, UDS positive for Cocaine  Psychotherapy: Group sessions  Medications: See MAR   Consultations: As needed  Discharge Concerns: Safety, mood stabilization  Estimated LOS: 5-7 days  Other: Admit to [facility]    Physician Treatment Plan for Primary Diagnosis: Will rinitiate medication management for mood stability. Set up an outpatient psychiatric services for medication management. Will encourage medication adherence with psychiatric medications.  Long Term Goal(s): Improvement in symptoms so as ready for discharge  Short Term Goals: Ability to identify changes in lifestyle to reduce recurrence of condition will improve, Ability to disclose and discuss suicidal ideas and Ability to demonstrate self-control will improve  Physician Treatment Plan for Secondary Diagnosis: Schizophrenia, Cocaine intoxication.  Long Term Goal(s): Improvement in symptoms so as ready for discharge  Short Term Goals: Ability to demonstrate self-control will improve, Ability to identify and develop effective coping behaviors will improve, Compliance with prescribed medications will improve and Ability to identify triggers associated with substance abuse/mental health issues will improve  I certify that inpatient  services furnished can reasonably be expected to improve the patient's condition.    Encarnacion Slates, NP, PMHNP, FNP-BC 4/1/20183:35 PM

## 2016-09-29 NOTE — BH Assessment (Addendum)
Tele Assessment Note   [name] is an 56 y.o. male who presents unaccompanied to [facility] ED after voicing suicidal ideation to EMS. Pt reports he was released from prison after being convicted of breaking and entering into a vehicle and [place] offender database indicates he was incarcerated from 08/28/15-09/24/16. Pt says he is currently homeless and has no place to go. He says he is very depressed with symptoms including  social withdrawal, loss of interest in usual pleasures, fatigue, decreased concentration, decreased sleep and feelings of hopelessness. Pt denies current auditory or visual hallucinations however during assessment Pt at times appears to be mumbling to himself and looking about the room. He reports suicidal ideation with no specific plan. Pt denies any history of suicide attempts. Pt denies homicidal ideation or history of violence. Pt has a history of substance abuse but denies use of alcohol or drugs since release from prison, however Pt's urine drug screen is positive for cocaine.  Pt identifies his primary stressor as homelessness. He cannot identify anyone in his life who is supportive. During assessment Pt reports he is taking Thorazine and Tylenol as prescribed but Pt told EDP he "left them at a group home and he doesn't want anything to do with them". Pt denies being on probation or having any pending court dates. Pt has been psychiatrically hospitalized in the past but cannot remember his last admission.  Pt is dressed in hospital scrubs, alert, oriented x4 with rapid, mumbling speech. He has restless motor behavior and is making odd gestures with his right hand. Eye contact is good. Pt's mood is depressed and affect is anxious. Thought process is coherent but Pt seems easily distracted. He was polite and cooperative throughout assessment. He states he is willing to sign voluntarily into a psychiatric facility.    Diagnosis: Schizoaffective Disorder  Past Medical  History:  Past Medical History:  Diagnosis Date  . CHF (congestive heart failure) (HCC)   . Chronic foot pain   . Cocaine abuse   . Diabetes mellitus without complication (HCC)   . Hepatitis C   . Homelessness   . Homelessness   . Hypertension   . Neuropathy (HCC)   . Polysubstance abuse   . Schizophrenia (HCC)     History reviewed. No pertinent surgical history.  Family History:  Family History  Problem Relation Age of Onset  . Hypertension Other   . Diabetes Other     Social History:  reports that he has been smoking Cigarettes.  He has a 20.00 pack-year smoking history. He uses smokeless tobacco. He reports that he drinks alcohol. He reports that he uses drugs, including "Crack" cocaine, Cocaine, and Marijuana, about 7 times per week.  Additional Social History:  Alcohol / Drug Use Pain Medications: See PTA medications Prescriptions: See PTA medications Over the Counter: See PTA medications History of alcohol / drug use?: Yes (Pt denies use in over a year. Pt has history of using alcohol and cocaine.) Longest period of sobriety (when/how long): Two years Negative Consequences of Use: Financial, Armed forces operational officer, Personal relationships, Work / School  CIWA: CIWA-Ar BP: (!) 160/92 Pulse Rate: 80 COWS:    PATIENT STRENGTHS: (choose at least two) Ability for insight Average or above average intelligence General fund of knowledge Motivation for treatment/growth  Allergies:  Allergies  Allergen Reactions  . Haldol [Haloperidol] Other (See Comments)    Muscle spasms, loss of voluntary movement. However, pt has taken Thorazine on multiple occasions with no adverse effects.  Home Medications:  (Not in a hospital admission)  OB/GYN Status:  No LMP for male patient.  General Assessment Data Location of Assessment: [facility] ED TTS Assessment: In system Is this a Tele or Face-to-Face Assessment?: Tele Assessment Is this an Initial Assessment or a Re-assessment for this  encounter?: Initial Assessment Marital status: Single Maiden name: NA Is patient pregnant?: No Pregnancy Status: No Living Arrangements: Other (Comment) (Homeless) Can pt return to current living arrangement?: Yes Admission Status: Voluntary Is patient capable of signing voluntary admission?: Yes Referral Source: Self/Family/Friend Insurance type: Medicare     Crisis Care Plan Living Arrangements: Other (Comment) (Homeless) Legal Guardian: Other: (Self) Name of Psychiatrist: None Name of Therapist: None  Education Status Is patient currently in school?: No Current Grade: NA Highest grade of school patient has completed: NA Name of school: NA Contact person: NA  Risk to self with the past 6 months Suicidal Ideation: Yes-Currently Present Has patient been a risk to self within the past 6 months prior to admission? : Yes Suicidal Intent: No Has patient had any suicidal intent within the past 6 months prior to admission? : No Is patient at risk for suicide?: Yes Suicidal Plan?: No Has patient had any suicidal plan within the past 6 months prior to admission? : No Access to Means: No What has been your use of drugs/alcohol within the last 12 months?: Pt denies alcohol or substance abuse in over one year Previous Attempts/Gestures: No How many times?: 0 Other Self Harm Risks: None Triggers for Past Attempts: None known Intentional Self Injurious Behavior: None Family Suicide History: No Recent stressful life event(s): Other (Comment) (Recent incarceration, Homeless) Persecutory voices/beliefs?: No Depression: Yes Depression Symptoms: Despondent, Isolating, Fatigue, Loss of interest in usual pleasures, Feeling worthless/self pity Substance abuse history and/or treatment for substance abuse?: No Suicide prevention information given to non-admitted patients: Not applicable  Risk to Others within the past 6 months Homicidal Ideation: No Does patient have any lifetime risk of  violence toward others beyond the six months prior to admission? : No Thoughts of Harm to Others: No Current Homicidal Intent: No Current Homicidal Plan: No Access to Homicidal Means: No Identified Victim: None History of harm to others?: No Assessment of Violence: None Noted Violent Behavior Description: Pt denies history of violence Does patient have access to weapons?: No Criminal Charges Pending?: No Does patient have a court date: No Is patient on probation?: No  Psychosis Hallucinations: None noted (Pt denies but appears to be responding to internal stimuli) Delusions: None noted  Mental Status Report Appearance/Hygiene: In scrubs Eye Contact: Good Motor Activity: Agitation, Restlessness Speech: Rapid Level of Consciousness: Alert Mood: Depressed Affect: Anxious Anxiety Level: Moderate Thought Processes: Coherent Judgement: Partial Orientation: Person, Place, Time, Situation, Appropriate for developmental age Obsessive Compulsive Thoughts/Behaviors: None  Cognitive Functioning Concentration: Decreased Memory: Recent Intact, Remote Intact IQ: Average Insight: Fair Impulse Control: Fair Appetite: Good Weight Loss: 0 Weight Gain: 0 Sleep: Decreased Total Hours of Sleep: 6 Vegetative Symptoms: None  ADLScreening [facility] Assessment Services) Patient's cognitive ability adequate to safely complete daily activities?: Yes Patient able to express need for assistance with ADLs?: Yes Independently performs ADLs?: Yes (appropriate for developmental age)  Prior Inpatient Therapy Prior Inpatient Therapy: Yes Prior Therapy Dates: Pt cannot remember Prior Therapy Facilty/Provider(s): [facility], other facilities Reason for Treatment: Schizoaffective disorder  Prior Outpatient Therapy Prior Outpatient Therapy: Yes Prior Therapy Dates: 2016 Prior Therapy Facilty/Provider(s): [name] Reason for Treatment: Schizoaffective disorder Does patient have an ACCT  team?: No Does  patient have Intensive In-House Services?  : No Does patient have [name] services? : No Does patient have P4CC services?: No  ADL Screening (condition at time of admission) Patient's cognitive ability adequate to safely complete daily activities?: Yes Is the patient deaf or have difficulty hearing?: No Does the patient have difficulty seeing, even when wearing glasses/contacts?: No Does the patient have difficulty concentrating, remembering, or making decisions?: No Patient able to express need for assistance with ADLs?: Yes Does the patient have difficulty dressing or bathing?: No Independently performs ADLs?: Yes (appropriate for developmental age) Does the patient have difficulty walking or climbing stairs?: No Weakness of Legs: None Weakness of Arms/Hands: None  Home Assistive Devices/Equipment Home Assistive Devices/Equipment: None    Abuse/Neglect Assessment (Assessment to be complete while patient is alone) Physical Abuse: Denies Verbal Abuse: Denies Sexual Abuse: Denies Exploitation of patient/patient's resources: Denies Self-Neglect: Denies     Merchant navy officer (For Healthcare) Does Patient Have a Medical Advance Directive?: No Would patient like information on creating a medical advance directive?: No - Patient declined    Additional Information 1:1 In Past 12 Months?: No CIRT Risk: No Elopement Risk: No Does patient have medical clearance?: Yes     Disposition: Brook McNichol, AC at [facility], says a bed will be available after 0800. Gave clinical report to Nira Conn, NP who said Pt meets criteria for inpatient psychiatric treatment and accepted Pt to the service of Dr. Jama Flavors, room 402-2, pending medical clearance. Notified Dr. Ross Marcus and Sherre Scarlet, RN of acceptance.  Disposition Initial Assessment Completed for this Encounter: Yes Disposition of Patient: Inpatient treatment program Type of inpatient treatment program: Adult   Pamalee Leyden, [facility], [facility], [facility] Triage Specialist [phone]   Patsy Baltimore, Harlin Rain 09/29/2016 2:10 AM

## 2016-09-29 NOTE — ED Notes (Signed)
Report to [place] at [facility].

## 2016-09-29 NOTE — Plan of Care (Signed)
Problem: Safety: Goal: Periods of time without injury will increase Outcome: Progressing Pt. remains a moderate fall risk, denies SI/HI/AVH, Q 15 checks in effect.    

## 2016-09-29 NOTE — BHH Suicide Risk Assessment (Signed)
[facility] Admission Suicide Risk Assessment   Nursing information obtained from:    Demographic factors:    Current Mental Status:    Loss Factors:    Historical Factors:    Risk Reduction Factors:     Total Time spent with patient: 20 minutes Principal Problem: Schizophrenia                                  Cocaine intoxication.  Diagnosis:   Patient Active Problem List   Diagnosis Date Noted  . Suicidal ideations [R45.851]   . Substance induced mood disorder (HCC) [F19.94] 03/13/2015  . Acute kidney failure (HCC) [N17.9] 01/26/2015  . Schizophrenia, paranoid type (HCC) [F20.0] 01/17/2015  . Suicidal ideation [R45.851]   . 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 Acute CHF (congestive heart failure) (HCC) [I50.9] 07/29/2014  . Cocaine use disorder, severe, dependence (HCC) [F14.20]   . Essential hypertension, benign [I10] 03/28/2013  . Diabetes mellitus (HCC) [E11.9] 03/15/2013   Subjective Data:  56 yo AAM with background history of schizophrenia. Presented to the ER via emergency services. Stated to have expressed suicidal thoughts. Very agitated and internally preoccupied. Threatening and required emergency medications. UDS positive for cocaine  Continued Clinical Symptoms:    The "Alcohol Use Disorders Identification Test", Guidelines for Use in Primary Care, Second Edition.  World Science writer [facility] Score between 0-7:  no or low risk or alcohol related problems. Score between 8-15:  moderate risk of alcohol related problems. Score between 16-19:  high risk of alcohol related problems. Score 20 or above:  warrants further diagnostic evaluation for alcohol dependence and treatment.   CLINICAL FACTORS:  Psychosis SUD   Musculoskeletal: Strength & Muscle Tone: within normal  limits Gait & Station: normal Patient leans: N/A  Psychiatric Specialty Exam: Physical Exam Unable to assess  ROS Unable to assess  Blood pressure 136/84, pulse 73, temperature 99 F (37.2 C), temperature source Oral, resp. rate 16, height  (1.727 m), weight 76.2 kg (168 lb), SpO2 97 %.Body mass index is 25.54 kg/m.  General Appearance: Disheveled, psychomotor agitation. Odd mannerisms. Internally preoccupied as he would mumble incomprehensible words at times  Eye Contact:  Limited because he is distracted by internal stimuli.   Speech:  mumble and then yells  Volume:  Loud  Mood:  Dysphoric  Affect:  odd and has a psychotic quality  Thought Process:  Disorganized  Orientation:  Unable to assess  Thought Content:  Unable to assess  Suicidal Thoughts:  Yes.  with intent/plan  Homicidal Thoughts:  Unable to assess  Memory:  Unable to assess  Judgement:  Poor  Insight:  Poor  Psychomotor Activity:  Increased  Concentration:  Distracted by internal stimuli  Recall:  Unable to assess  Fund of Knowledge:  Poor  Language:  Poor  Akathisia:    Handed:    AIMS (if indicated):     Assets:  Unable to assess  ADL's:  Impaired  Cognition:  Impaired,  Moderate  Sleep:         COGNITIVE FEATURES THAT CONTRIBUTE TO RISK:  Loss of executive function    SUICIDE RISK:   Moderate:  Frequent suicidal ideation with limited intensity, and duration, some specificity in terms of plans, no associated intent, good self-control, limited dysphoria/symptomatology,  some risk factors present, and identifiable protective factors, including available and accessible social support.  PLAN OF CARE:   1. Rapid tranquilization with Geodon, Benadryl and Ativan 2. Olanzapine 10 mg HS  I certify that inpatient services furnished can reasonably be expected to improve the patient's condition.   Georgiann Cocker, MD 09/29/2016, 2:46 PM

## 2016-09-30 ENCOUNTER — Encounter (HOSPITAL_COMMUNITY): Payer: Self-pay | Admitting: Psychiatry

## 2016-09-30 DIAGNOSIS — F17210 Nicotine dependence, cigarettes, uncomplicated: Secondary | ICD-10-CM

## 2016-09-30 DIAGNOSIS — F250 Schizoaffective disorder, bipolar type: Secondary | ICD-10-CM | POA: Diagnosis present

## 2016-09-30 DIAGNOSIS — F1290 Cannabis use, unspecified, uncomplicated: Secondary | ICD-10-CM

## 2016-09-30 DIAGNOSIS — Z79899 Other long term (current) drug therapy: Secondary | ICD-10-CM

## 2016-09-30 DIAGNOSIS — F1490 Cocaine use, unspecified, uncomplicated: Secondary | ICD-10-CM

## 2016-09-30 LAB — TSH: TSH: 0.408 u[IU]/mL (ref 0.350–4.500)

## 2016-09-30 LAB — LIPID PANEL
CHOL/HDL RATIO: 2.9 ratio
Cholesterol: 136 mg/dL (ref 0–200)
HDL: 47 mg/dL (ref >40)
LDL CALC: 76 mg/dL (ref 0–99)
TRIGLYCERIDES: 65 mg/dL (ref <150)
VLDL: 13 mg/dL (ref 0–40)

## 2016-09-30 MED ORDER — ASENAPINE MALEATE 5 MG SL SUBL
5.0000 mg | SUBLINGUAL_TABLET | Freq: Two times a day (BID) | SUBLINGUAL | Status: DC | PRN
  Filled 2016-09-30: qty 1

## 2016-09-30 MED ORDER — HYDROXYZINE HCL 25 MG PO TABS
25.0000 mg | ORAL_TABLET | Freq: Four times a day (QID) | ORAL | Status: DC | PRN
  Administered 2016-09-30 – 2016-10-03 (×6): 25 mg via ORAL
  Filled 2016-09-30 (×6): qty 1

## 2016-09-30 MED ORDER — AMLODIPINE BESYLATE 5 MG PO TABS
5.0000 mg | ORAL_TABLET | Freq: Every day | ORAL | Status: DC
  Administered 2016-09-30 – 2016-10-02 (×3): 5 mg via ORAL
  Filled 2016-09-30 (×6): qty 1

## 2016-09-30 MED ORDER — NICOTINE 14 MG/24HR TD PT24
14.0000 mg | MEDICATED_PATCH | Freq: Every day | TRANSDERMAL | Status: DC
  Administered 2016-09-30 – 2016-10-03 (×4): 14 mg via TRANSDERMAL
  Filled 2016-09-30 (×7): qty 1

## 2016-09-30 MED ORDER — LAMOTRIGINE 25 MG PO TABS
25.0000 mg | ORAL_TABLET | Freq: Every day | ORAL | Status: DC
  Administered 2016-09-30 – 2016-10-04 (×5): 25 mg via ORAL
  Filled 2016-09-30 (×6): qty 1

## 2016-09-30 NOTE — Progress Notes (Signed)
Recreation Therapy Notes  Date: 09/30/16 Time: 1000 Location: 500 Hall Dayroom  Group Topic: Coping Skills  Goal Area(s) Addresses:  Patients will be able to identify positive coping skills. Patients will be able to identify the benefits of coping skills. Patients will be able to identify the benefits of using coping skills post d/c.  Intervention: Magazines, coping skills worksheet, scissors, glue sticks, construction paper  Activity: Coping Skills.  Patients were to identify coping skills in the magazines that could be used for diversions, social, cognitive, tension releasers and physical.  Patients were to cut out pictures of the coping skills they identified and glue them under section where they would be most useful to them.  Education: Pharmacologist, Building control surveyor.   Education Outcome: Acknowledges understanding/In group clarification offered/Needs additional education.   Clinical Observations/Feedback: Pt did not attend group.   Caroll Rancher, LRT/CTRS         Caroll Rancher A 09/30/2016 12:39 PM

## 2016-09-30 NOTE — Progress Notes (Signed)
  DATA ACTION RESPONSE  Objective- Pt. is visible in his room, just woke up from a nap. Presents with an anxious affect and mood. Improvement in behavior noted. Is forgetful.  Subjective- Denies having any SI/HI/AVH at this time. Rates pain 6/10; teeth. Pt. states " I am alright. My tooth hurts". Continues to remain safe on the unit. Needs re-direction at times but is receptive.  1:1 interaction in private to establish rapport. Encouragement, education, & support given from staff. Meds. ordered and administered. PRN Tylenol and Vistaril requested and will re-eval accordingly.   Safety maintained with Q 15 checks. Continues to follow treatment plan and will monitor closely. No additonal questions/concerns noted.

## 2016-09-30 NOTE — BHH Counselor (Addendum)
Adult Comprehensive Assessment  Patient ID: [name], male DOB: 04/04/1961, 56 y.o. MRN: [id number]  Information Source: Information source: Patient  Current Stressors:  Educational / Learning stressors: Pt. denies Employment / Job issues: Pt denies Family Relationships: Pt denies Surveyor, quantity / Lack of resources (include bankruptcy): Pt denies Housing / Lack of housing:Pt is homeless and is requesting placement in facility that can provide structure Physical health (include injuries & life threatening diseases): Pt denies Social relationships: Pt denies social support; was recently released from prison last week Substance abuse: Crack cocaine Bereavement / Loss: Pt denies   Living/Environment/Situation:  Living Arrangements: Homeless  Living conditions (as described by patient or guardian): "on the streets; I was paranoid" How long has patient lived in current situation?: 1wk; since being released from prison  What is atmosphere in current home: Dangerous  Family History:  Marital status: Single Does patient have children?: No  Childhood History:  By whom was/is the patient raised?: Other (Comment) ("I raised myself") Description of patient's relationship with caregiver when they were a child: Pt did not want to share Patient's description of current relationship with people who raised him/her: Pt did not want to share; however, reports they are living Does patient have siblings?: No Did patient suffer any verbal/emotional/physical/sexual abuse as a child?: No Did patient suffer from severe childhood neglect?: No Has patient ever been sexually abused/assaulted/raped as an adolescent or adult?: No Was the patient ever a victim of a crime or a disaster?: No Witnessed domestic violence?: No Has patient been effected by domestic violence as an adult?: No  Education:  Highest grade of school patient has completed: 12th Currently a student?: No Learning  disability?: No  Employment/Work Situation:  Employment situation: On disability Why is patient on disability: Mental Health  How long has patient been on disability: Since 1986 Patient's job has been impacted by current illness: No What is the longest time patient has a held a job?: 3 years 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 Has patient ever been in the [employer]?: Yes (Describe in comment) Therapist, occupational (served for 2 years)) Has patient ever served in combat?: Yes 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 [place]  Financial Resources:  Financial resources: [employer], [employer], [employer], Medicare Does patient have a Lawyer or guardian?: No, per Pt he is his own payee  Alcohol/Substance Abuse:  What has been your use of drugs/alcohol within the last 12 months?: Crack cocaine, If attempted suicide, did drugs/alcohol play a role in this?: No Alcohol/Substance Abuse Treatment Hx: Past Tx, Inpatient, Past Tx, Outpatient, Attends AA/NA, Past detox Has alcohol/substance abuse ever caused legal problems?: Yes  Social Support System:  Patient's Community Support System: Poor Describe Community Support System: "I have no one" How does patient's faith help to cope with current illness?: Not sure  Leisure/Recreation:  Leisure and Hobbies: I like to play basketball, swim  Strengths/Needs:  What things does the patient do well?: "Give people good advice, knowledge, wisdom and understanding." In what areas does patient struggle / problems for patient: homelessness  Discharge Plan:  Does patient have access to transportation?: No Plan for no access to transportation at discharge: Needs bus pass Will patient be returning to same living situation after discharge?: No. Pt wants placement. CSW assessing.  Currently receiving community mental health services: No If no, would patient  like referral for services when discharged?: Yes, TBD when placement is confirmed Does  patient have financial barriers related to discharge medications?: No  Summary/Recommendations:  Patient is a 56 year old male with a diagnosis of Schizoaffective Disorder. Pt presented to the hospital with thoughts of suicide and increased paranoia and hallucinations. Pt reports primary trigger(s) for admission include homelessness and not being able to access psychiatric care. Patient will benefit from crisis stabilization, medication evaluation, group therapy and psycho education in addition to case management for discharge planning. At discharge it is recommended that Pt remain compliant with established discharge plan and continued treatment.   Vernie Shanks, LCSW Clinical Social Work [phone]

## 2016-09-30 NOTE — BHH Group Notes (Signed)
BHH LCSW Group Therapy  09/30/2016 1:15 pm  Type of Therapy: Process Group Therapy  Participation Level:  Active  Participation Quality:  Appropriate  Affect:  Flat  Cognitive:  Oriented  Insight:  Improving  Engagement in Group:  Limited  Engagement in Therapy:  Limited  Modes of Intervention:  Activity, Clarification, Education, Problem-solving and Support  Summary of Progress/Problems: Today's group addressed the issue of overcoming obstacles.  Patients were asked to identify their biggest obstacle post d/c that stands in the way of their on-going success, and then problem solve as to how to manage this. Invited.  Chose to not attend.  Daryel Gerald B 09/30/2016   3:03 PM

## 2016-09-30 NOTE — Progress Notes (Signed)
[facility] MD Progress Note  09/30/2016 4:36 PM [name]  MRN:  [id number] Subjective:  Pt states " I am ok, its getting better.'  Objective:Patient seen and chart reviewed.Discussed patient with treatment team.   Pt today seen as pressured, anxious , restless, reports VH , states it is improving. Pt reports he is tolerating his medications well, he is however open to adding Lamictal for mood sx. Per RN , pt placed on UR due to having conflict with another pt on the other unit. Pt is able to attend groups and is making an attempt to participate here. Will continue to treat.   Principal Problem: Schizoaffective disorder, bipolar type (HCC) Diagnosis:   Patient Active Problem List   Diagnosis Date Noted  . Schizoaffective disorder, bipolar type (HCC) [F25.0] 09/30/2016  . Suicidal ideations [R45.851]   . Substance induced mood disorder (HCC) [F19.94] 03/13/2015  . Acute kidney failure (HCC) [N17.9] 01/26/2015  . Schizophrenia, paranoid type (HCC) [F20.0] 01/17/2015  . Suicidal ideation [R45.851]   . 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 Acute CHF (congestive heart failure) (HCC) [I50.9] 07/29/2014  . Cocaine use disorder, severe, dependence (HCC) [F14.20]   . Essential hypertension, benign [I10] 03/28/2013  . Diabetes mellitus (HCC) [E11.9] 03/15/2013   Total Time spent with patient: 25 minutes  Past Psychiatric History: Please see H&P.   Past Medical History:  Past Medical History:  Diagnosis Date  . CHF (congestive heart failure) (HCC)   . Chronic foot pain   . Cocaine abuse   . Diabetes mellitus without complication (HCC)   . Hepatitis C   . Homelessness   . Homelessness   . Hypertension   . Neuropathy (HCC)   . Polysubstance abuse   . Schizophrenia (HCC)    History  reviewed. No pertinent surgical history. Family History:  Family History  Problem Relation Age of Onset  . Hypertension Other   . Diabetes Other    Family Psychiatric  History: Please see H&P.  Social History: Please see H&P.  History  Alcohol Use  . Yes    Comment: Daily Drinker      History  Drug Use  . Frequency: 7.0 times per week  . Types: "Crack" cocaine, Cocaine, Marijuana    Comment: Cocaine tonight, Marijuana "a long time"    Social History   Social History  . Marital status: Divorced    Spouse name: N/A  . Number of children: N/A  . Years of education: N/A   Social History Main Topics  . Smoking status: Current Every Day Smoker    Packs/day: 1.00    Years: 20.00    Types: Cigarettes  . Smokeless tobacco: Current User  . Alcohol use Yes     Comment: Daily Drinker   . Drug use: Yes    Frequency: 7.0 times per week    Types: "Crack" cocaine, Cocaine, Marijuana     Comment: Cocaine tonight, Marijuana "a long time"  . Sexual activity: No   Other Topics Concern  . None   Social History Narrative   ** Merged History Encounter **       Additional Social History:                         Sleep: Fair  Appetite:  Fair  Current Medications: Current Facility-Administered Medications  Medication Dose Route Frequency Provider Last Rate Last Dose  . acetaminophen (TYLENOL) tablet 650 mg  650 mg Oral Q6H PRN Jackelyn Poling, NP   650 mg at 09/29/16 2147  . alum & mag hydroxide-simeth (MAALOX/MYLANTA) 200-200-20 MG/5ML suspension 15 mL  15 mL Oral Q4H PRN Jackelyn Poling, NP      . lamoTRIgine (LAMICTAL) tablet 25 mg  25 mg Oral Daily Daegen Berrocal, MD      . LORazepam (ATIVAN) injection 1 mg  1 mg Intravenous Once Sanjuana Kava, NP      . magnesium hydroxide (MILK OF MAGNESIA) suspension 30 mL  30 mL Oral Daily PRN Jackelyn Poling, NP      . nicotine (NICODERM CQ - dosed in mg/24 hours) patch 14 mg  14 mg Transdermal Daily Meleana Commerford, MD      .  OLANZapine zydis (ZYPREXA) disintegrating tablet 10 mg  10 mg Oral QHS Georgiann Cocker, MD   10 mg at 09/29/16 2137  . OLANZapine zydis (ZYPREXA) disintegrating tablet 5 mg  5 mg Oral BID BM PRN Georgiann Cocker, MD        Lab Results:  Results for orders placed or performed during the hospital encounter of 09/29/16 (from the past 48 hour(s))  Lipid panel     Status: None   Collection Time: 09/30/16  6:05 AM  Result Value Ref Range   Cholesterol 136 0 - 200 mg/dL   Triglycerides 65 <960 mg/dL   HDL 47 >45 mg/dL   Total CHOL/HDL Ratio 2.9 RATIO   VLDL 13 0 - 40 mg/dL   LDL Cholesterol 76 0 - 99 mg/dL    Comment:        Total Cholesterol/HDL:CHD Risk Coronary Heart Disease Risk Table                     Men   Women  1/2 Average Risk   3.4   3.3  Average Risk       5.0   4.4  2 X Average Risk   9.6   7.1  3 X Average Risk  23.4   11.0        Use the calculated Patient Ratio above and the CHD Risk Table to determine the patient's CHD Risk.        ATP III CLASSIFICATION (LDL):  <100     mg/dL   Optimal  409-811  mg/dL   Near or Above                    Optimal  130-159  mg/dL   Borderline  914-782  mg/dL   High  >956     mg/dL   Very High Performed at [facility], [address] [place], [place] [address]   TSH     Status: None   Collection Time: 09/30/16  6:05 AM  Result Value Ref Range   TSH 0.408 0.350 - 4.500 uIU/mL    Comment: Performed by a 3rd Generation assay with a functional sensitivity of <=0.01 uIU/mL. Performed at [facility], [address] [place], [place] [address]     Blood Alcohol level:  Lab Results  Component Value Date   [facility] <5 09/29/2016   ETH <5 07/29/2015    Metabolic Disorder Labs: Lab Results  Component Value Date   HGBA1C NWBR 04/14/2015   MPG 128 07/29/2014   MPG 194 (H) 03/14/2013   No  results found for: PROLACTIN Lab Results  Component Value Date   CHOL 136 09/30/2016   TRIG 65 09/30/2016   HDL  47 09/30/2016   CHOLHDL 2.9 09/30/2016   VLDL 13 09/30/2016   LDLCALC 76 09/30/2016   LDLCALC 91 01/21/2015    Physical Findings: AIMS:  , ,  ,  ,    CIWA:    COWS:     Musculoskeletal: Strength & Muscle Tone: within normal limits Gait & Station: normal Patient leans: N/A  Psychiatric Specialty Exam: Physical Exam  Nursing note and vitals reviewed.   Review of Systems  Psychiatric/Behavioral: The patient is nervous/anxious.   All other systems reviewed and are negative.   Blood pressure (!) 178/87, pulse 76, temperature 98.2 F (36.8 C), temperature source Oral, resp. rate 20, height  (1.727 m), weight 76.2 kg (168 lb), SpO2 97 %.Body mass index is 25.54 kg/m.  General Appearance: Guarded  Eye Contact:  Fair  Speech:  Pressured  Volume:  Normal  Mood:  Anxious  Affect:  Labile  Thought Process:  Coherent and Descriptions of Associations: Circumstantial  Orientation:  Full (Time, Place, and Person)  Thought Content:  Hallucinations: Visual and Rumination  Suicidal Thoughts:  No  Homicidal Thoughts:  No  Memory:  Immediate;   Fair Recent;   Fair Remote;   Fair  Judgement:  Impaired  Insight:  Shallow  Psychomotor Activity:  Restlessness  Concentration:  Concentration: Fair and Attention Span: Fair  Recall:  [employer] of Knowledge:  Fair  Language:  Fair  Akathisia:  No  Handed:  Right  AIMS (if indicated):     Assets:  Desire for Improvement  ADL's:  Intact  Cognition:  WNL  Sleep:  Number of Hours: 6   Will continue today 09/30/16  plan as below except where it is noted.   Treatment Plan Summary:Patient seen as pressured, anxious , has VH , which is improving, continues to need medication readjustment. Daily contact with patient to assess and evaluate symptoms and progress in treatment, Medication management and Plan see below   Will add Lamictal 25 mg po daily for mood sx. Will continue Zyprexa 10 mg po qhs for psychosis/mood sx. Vistaril 25 mg  po tid prn for anxiety sx. Saphris 5 mg SL bid prn for severe anxiety/agitation. Provided substance abuse counseling. CSW will continue to work on disposition/referral to substance abuse treatment program.  Thompson Mckim, MD 09/30/2016, 4:36 PM

## 2016-09-30 NOTE — Progress Notes (Signed)
Patient ID: [name], male   DOB: 1961/01/27, 56 y.o.   MRN: [id number] PER STATE REGULATIONS 482.30  THIS CHART WAS REVIEWED FOR MEDICAL NECESSITY WITH RESPECT TO THE PATIENT'S ADMISSION/DURATION OF STAY.  NEXT REVIEW DATE:10/03/16  Loura Halt, RN, BSN CASE MANAGER

## 2016-09-30 NOTE — Progress Notes (Signed)
Recreation Therapy Notes  INPATIENT RECREATION THERAPY ASSESSMENT  Patient Details Name: [name] MRN: [id number] DOB: 1961/03/13 Today's Date: 09/30/2016  Patient Stressors: Other (Comment) (Everyday life)  Pt stated he had gotten sick and was paranoid.  Coping Skills:   Exercise, Art/Dance, Talking, Music, Sports  Personal Challenges: Anger, Communication, Concentration, Decision-Making, Expressing Yourself, Problem-Solving, Relationships, Self-Esteem/Confidence, Social Interaction, Stress Management, Substance Abuse, Time Management, Trusting Others  Leisure Interests (2+):  Individual - Reading, Individual - Other (Comment) (Go for a ride)  Awareness of Community Resources:  Yes  Community Resources:  Library, Coffee Shop  Current Use: Yes  Patient Strengths:  Paying attention  Patient Identified Areas of Improvement:  Paranoia, budgeting  Current Recreation Participation:  Once a week  Patient Goal for Hospitalization:  "Work on being drug free"  [place] of Residence:  [place] of Residence:  [place]  Current [place] (including self-harm):  No  Current HI:  No  Consent to Intern Participation: N/A   Caroll Rancher, LRT/CTRS  Caroll Rancher A 09/30/2016, 2:47 PM

## 2016-09-30 NOTE — Tx Team (Signed)
Initial Treatment Plan 09/30/2016 3:44 AM [name] [id number]    PATIENT STRESSORS: Financial difficulties Health problems Legal issue Medication change or noncompliance Substance abuse   PATIENT STRENGTHS: Motivation for treatment/growth Special hobby/interest   PATIENT IDENTIFIED PROBLEMS: Depression   At risk for suicide   Substance abuse   "I need to get off cocaine"   "I don't have a home"              DISCHARGE CRITERIA:  Ability to meet basic life and health needs Adequate post-discharge living arrangements Improved stabilization in mood, thinking, and/or behavior Medical problems require only outpatient monitoring Motivation to continue treatment in a less acute level of care Need for constant or close observation no longer present Reduction of life-threatening or endangering symptoms to within safe limits Safe-care adequate arrangements made Verbal commitment to aftercare and medication compliance  PRELIMINARY DISCHARGE PLAN: Attend 12-step recovery group Placement in alternative living arrangements  PATIENT/FAMILY INVOLVEMENT: This treatment plan has been presented to and reviewed with the patient, [name].The patient and family have been given the opportunity to ask questions and make suggestions.  Tyrone Apple, RN 09/30/2016, 3:44 AM

## 2016-09-30 NOTE — Plan of Care (Signed)
Problem: Activity: Goal: Interest or engagement in activities will improve Outcome: Not Progressing Remains isolative to room. Needs encouragement.

## 2016-10-01 LAB — PROLACTIN: Prolactin: 19.7 ng/mL — ABNORMAL HIGH (ref 4.0–15.2)

## 2016-10-01 LAB — HEMOGLOBIN A1C
Hgb A1c MFr Bld: 5.8 % — ABNORMAL HIGH (ref 4.8–5.6)
Mean Plasma Glucose: 120 mg/dL

## 2016-10-01 LAB — BASIC METABOLIC PANEL
Anion gap: 8 (ref 5–15)
BUN: 19 mg/dL (ref 6–20)
CHLORIDE: 106 mmol/L (ref 101–111)
CO2: 27 mmol/L (ref 22–32)
Calcium: 8.8 mg/dL — ABNORMAL LOW (ref 8.9–10.3)
Creatinine, Ser: 0.92 mg/dL (ref 0.61–1.24)
GFR calc non Af Amer: >60 mL/min (ref >60)
Glucose, Bld: 183 mg/dL — ABNORMAL HIGH (ref 65–99)
Potassium: 3.7 mmol/L (ref 3.5–5.1)
SODIUM: 141 mmol/L (ref 135–145)

## 2016-10-01 MED ORDER — IBUPROFEN 400 MG PO TABS
400.0000 mg | ORAL_TABLET | Freq: Three times a day (TID) | ORAL | Status: DC
  Administered 2016-10-01 – 2016-10-03 (×3): 400 mg via ORAL
  Filled 2016-10-01 (×14): qty 1

## 2016-10-01 MED ORDER — BUPROPION HCL 75 MG PO TABS
75.0000 mg | ORAL_TABLET | Freq: Every day | ORAL | Status: DC
  Administered 2016-10-01 – 2016-10-04 (×4): 75 mg via ORAL
  Filled 2016-10-01 (×6): qty 1

## 2016-10-01 MED ORDER — OLANZAPINE 7.5 MG PO TABS
7.5000 mg | ORAL_TABLET | Freq: Every day | ORAL | Status: DC
  Administered 2016-10-01 – 2016-10-03 (×3): 7.5 mg via ORAL
  Filled 2016-10-01 (×4): qty 1

## 2016-10-01 MED ORDER — TUBERCULIN PPD 5 UNIT/0.1ML ID SOLN
5.0000 [IU] | Freq: Once | INTRADERMAL | Status: AC
  Administered 2016-10-01: 5 [IU] via INTRADERMAL

## 2016-10-01 NOTE — [facility] Group Notes (Signed)
[facility] LCSW Group Therapy  10/01/2016 , 2:59 PM   Type of Therapy:  Group Therapy  Participation Level:  Active  Participation Quality:  Attentive  Affect:  Appropriate  Cognitive:  Alert  Insight:  Improving  Engagement in Therapy:  Engaged  Modes of Intervention:  Discussion, Exploration and Socialization  Summary of Progress/Problems: Today's group focused on the term Diagnosis.  Participants were asked to define the term, and then pronounce whether it is a negative, positive or neutral term. In and out multiple times.  Hard time sitting still for any significant period of time.  Pressured and loud when speaking.  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  Our society has changed since I was a young man.  People are scared.  Everyone needs to come together and work together for change and safety."  Daryel Gerald B 10/01/2016 , 2:59 PM

## 2016-10-01 NOTE — Plan of Care (Signed)
Problem: Activity: Goal: Interest or engagement in leisure activities will improve Outcome: Not Progressing Pt. remains isolative to room. Needs encouragement.

## 2016-10-01 NOTE — Progress Notes (Signed)
Patient attended the evening group session and answered all discussion prompts from this Clinical research associate. Patient stated his goal for the day was to pay attention to important instructions in life and to stay clean. Patient rated his day a 7 out of 10 and his affect was appropriate.

## 2016-10-01 NOTE — Progress Notes (Signed)
Recreation Therapy Notes  Date: 10/01/16 Time: 1000 Location: 500 Hall Dayroom  Group Topic: Communication  Goal Area(s) Addresses:  Patient will effectively communicate with peers in group.  Patient will verbalize benefit of healthy communication. Patient will verbalize positive effect of healthy communication on post d/c goals.  Patient will identify communication techniques that made activity effective for group.   Intervention:  3 Small [place] Balls, Chairs  Activity: Group Juggle.  Patients were seated in a circle.  LRT put one ball in play to give the group an opportunity to get a rhythm going.  Once the found a rhythm, LRT added another ball to rotation.  Once again when the patients found a rhythm, the last ball was put into play.  LRT would time the patients to see how long they could keep the balls moving without the balls coming to a stop.  Patients were to keep the balls going.  Patients could bounce the balls off of the floor but the balls could not come to a complete stop.  If one ball stopped moving, the time would start over.  Education: Communication, Discharge Planning  Education Outcome: Acknowledges understanding/In group clarification offered/Needs additional education.   Clinical Observations/Feedback: Pt did not attend group.   Clydene Burack, LRT/CTRS         Zhanna Melin A 10/01/2016 12:15 PM 

## 2016-10-01 NOTE — Tx Team (Signed)
Interdisciplinary Treatment and Diagnostic Plan Update  10/01/2016 Time of Session: 3:01 PM  [name] MRN: [id number]  Principal Diagnosis: Schizoaffective disorder, bipolar type [facility])  Secondary Diagnoses: Principal Problem:   Schizoaffective disorder, bipolar type (HCC) Active Problems:   Cocaine use disorder, severe, dependence (HCC)   Current Medications:  Current Facility-Administered Medications  Medication Dose Route Frequency Provider Last Rate Last Dose  . acetaminophen (TYLENOL) tablet 650 mg  650 mg Oral Q6H PRN Jackelyn Poling, NP   650 mg at 10/01/16 1149  . alum & mag hydroxide-simeth (MAALOX/MYLANTA) 200-200-20 MG/5ML suspension 15 mL  15 mL Oral Q4H PRN Jackelyn Poling, NP      . amLODipine (NORVASC) tablet 5 mg  5 mg Oral Daily Jomarie Longs, MD   5 mg at 10/01/16 0829  . asenapine (SAPHRIS) sublingual tablet 5 mg  5 mg Sublingual BID PRN Jomarie Longs, MD      . buPROPion (WELLBUTRIN) tablet 75 mg  75 mg Oral Daily Saramma Eappen, MD   75 mg at 10/01/16 1149  . hydrOXYzine (ATARAX/VISTARIL) tablet 25 mg  25 mg Oral Q6H PRN Jomarie Longs, MD   25 mg at 10/01/16 1149  . lamoTRIgine (LAMICTAL) tablet 25 mg  25 mg Oral Daily Jomarie Longs, MD   25 mg at 10/01/16 0829  . LORazepam (ATIVAN) injection 1 mg  1 mg Intravenous Once Sanjuana Kava, NP      . magnesium hydroxide (MILK OF MAGNESIA) suspension 30 mL  30 mL Oral Daily PRN Jackelyn Poling, NP   30 mL at 10/01/16 1154  . nicotine (NICODERM CQ - dosed in mg/24 hours) patch 14 mg  14 mg Transdermal Daily Saramma Eappen, MD   14 mg at 10/01/16 0800  . OLANZapine (ZYPREXA) tablet 7.5 mg  7.5 mg Oral QHS Jomarie Longs, MD        PTA Medications: Prescriptions Prior to Admission  Medication Sig Dispense Refill Last Dose  . amantadine (SYMMETREL) 100 MG capsule Take 1 capsule (100 mg total) by mouth 2 (two) times daily. (Patient not taking: Reported on 07/28/2015) 60 capsule 0 Not Taking at Unknown time  .  chlorproMAZINE (THORAZINE) 50 MG tablet Take 1 tablet (50 mg total) by mouth daily. 30 tablet 0 Past Week at Unknown time    Treatment Modalities: Medication Management, Group therapy, Case management,  1 to 1 session with clinician, Psychoeducation, Recreational therapy.   Physician Treatment Plan for Primary Diagnosis: Schizoaffective disorder, bipolar type (HCC) Long Term Goal(s): Improvement in symptoms so as ready for discharge  Short Term Goals: Ability to identify changes in lifestyle to reduce recurrence of condition will improve Ability to disclose and discuss suicidal ideas Ability to demonstrate self-control will improve Ability to demonstrate self-control will improve Ability to identify and develop effective coping behaviors will improve Compliance with prescribed medications will improve Ability to identify triggers associated with substance abuse/mental health issues will improve  Medication Management: Evaluate patient's response, side effects, and tolerance of medication regimen.  Therapeutic Interventions: 1 to 1 sessions, Unit Group sessions and Medication administration.  Evaluation of Outcomes: Progressing  Physician Treatment Plan for Secondary Diagnosis: Principal Problem:   Schizoaffective disorder, bipolar type (HCC) Active Problems:   Cocaine use disorder, severe, dependence (HCC)   Long Term Goal(s): Improvement in symptoms so as ready for discharge  Short Term Goals: Ability to identify changes in lifestyle to reduce recurrence of condition will improve Ability to disclose and discuss suicidal ideas Ability to  demonstrate self-control will improve Ability to demonstrate self-control will improve Ability to identify and develop effective coping behaviors will improve Compliance with prescribed medications will improve Ability to identify triggers associated with substance abuse/mental health issues will improve  Medication Management: Evaluate  patient's response, side effects, and tolerance of medication regimen.  Therapeutic Interventions: 1 to 1 sessions, Unit Group sessions and Medication administration.  Evaluation of Outcomes: Progressing   RN Treatment Plan for Primary Diagnosis: Schizoaffective disorder, bipolar type (HCC) Long Term Goal(s): Knowledge of disease and therapeutic regimen to maintain health will improve  Short Term Goals: Ability to identify and develop effective coping behaviors will improve and Compliance with prescribed medications will improve  Medication Management: RN will administer medications as ordered by provider, will assess and evaluate patient's response and provide education to patient for prescribed medication. RN will report any adverse and/or side effects to prescribing provider.  Therapeutic Interventions: 1 on 1 counseling sessions, Psychoeducation, Medication administration, Evaluate responses to treatment, Monitor vital signs and CBGs as ordered, Perform/monitor CIWA, COWS, AIMS and Fall Risk screenings as ordered, Perform wound care treatments as ordered.  Evaluation of Outcomes: Progressing   LCSW Treatment Plan for Primary Diagnosis: Schizoaffective disorder, bipolar type (HCC) Long Term Goal(s): Safe transition to appropriate next level of care at discharge, Engage patient in therapeutic group addressing interpersonal concerns.  Short Term Goals: Engage patient in aftercare planning with referrals and resources and Increase social support  Therapeutic Interventions: Assess for all discharge needs, 1 to 1 time with Social worker, Explore available resources and support systems, Assess for adequacy in community support network, Educate family and significant other(s) on suicide prevention, Complete Psychosocial Assessment, Interpersonal group therapy.  Evaluation of Outcomes: Progressing   Progress in Treatment: Attending groups: Yes Participating in groups: Yes Taking medication  as prescribed: Yes Toleration medication: Yes, no side effects reported at this time Family/Significant other contact made: No Patient understands diagnosis: Yes AEB asking for help with stability.  "I was out there pissing and pooping my pants.  I was lucky I wasn't killed." Discussing patient identified problems/goals with staff: Yes Medical problems stabilized or resolved: Yes Denies suicidal/homicidal ideation: Yes Issues/concerns per patient self-inventory: None Other: N/A  New problem(s) identified: None identified at this time.   New Short Term/Long Term Goal(s): None identified at this time.   Discharge Plan or Barriers: Asking for referral to "rest home"  Reason for Continuation of Hospitalization:  Hallucinations Cocaine Use Mood instability Work on placement Medication stabilization   Estimated Length of Stay: 3-5 days  Attendees: Patient: 10/01/2016  3:01 PM  Physician: Jomarie Longs, MD 10/01/2016  3:01 PM  Nursing: Antonieta Iba, RN 10/01/2016  3:01 PM  RN Care Manager: [name], RN 10/01/2016  3:01 PM  Social Worker: Richelle Ito 10/01/2016  3:01 PM  Recreational Therapist: Royston Cowper  10/01/2016  3:01 PM  Other: Tomasita Morrow 10/01/2016  3:01 PM  Other:  10/01/2016  3:01 PM    Scribe for Treatment Team:  Daryel Gerald LCSW 10/01/2016 3:01 PM

## 2016-10-01 NOTE — Progress Notes (Signed)
DAR NOTE: Patient presents with anxious affect and mood.  Speech is pressured and rapid.  Preoccupied with getting discharged.  Denies suicidal thoughts, auditory and visual hallucinations.  Rates depression at 0, hopelessness at 0, and anxiety at 5.  Maintained on routine safety checks.  Medications given as prescribed.  Support and encouragement offered as needed.  Attended group and participated.  Patient is visible in the milieu for therapy and activity.  Tylenol 650 mg given for complain of toothache with good effect.  Vistaril 25 mg given for complain of anxiety with good effect.

## 2016-10-01 NOTE — Progress Notes (Signed)
[facility] MD Progress Note  10/01/2016 3:30 PM [name]  MRN:  [id number] Subjective: Pt states " I am feeling tired."   Objective:Patient seen and chart reviewed.Discussed patient with treatment team.  Pt today reports feeling depressed and tired . Pt when asked about his recent cocaine abuse - reports he has note done any cocaine in past 15 months. Discussed his recent UDS , which came positive three days ago for cocaine , he did not elaborate. Discussed risks of being on cocaine as well as provided counseling. Discussed adding a new medications for his mood , he agrees to start wellburtrin. He is also preoccupied with his hand which is swollen - discussed topical cream, as well as getting BMP - he has no memory of how it happened .       Principal Problem: Schizoaffective disorder, bipolar type (HCC) Diagnosis:   Patient Active Problem List   Diagnosis Date Noted  . Schizoaffective disorder, bipolar type (HCC) [F25.0] 09/30/2016  . Suicidal ideations [R45.851]   . Substance induced mood disorder (HCC) [F19.94] 03/13/2015  . Acute kidney failure (HCC) [N17.9] 01/26/2015  . Schizophrenia, paranoid type (HCC) [F20.0] 01/17/2015  . Suicidal ideation [R45.851]   . 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 Acute CHF (congestive heart failure) (HCC) [I50.9] 07/29/2014  . Cocaine use disorder, severe, dependence (HCC) [F14.20]   . Essential hypertension, benign [I10] 03/28/2013  . Diabetes mellitus (HCC) [E11.9] 03/15/2013   Total Time spent with patient: 25 minutes  Past Psychiatric History: Please see H&P.   Past Medical History:  Past Medical History:  Diagnosis Date  . CHF (congestive heart failure) (HCC)   . Chronic foot pain   . Cocaine abuse   . Diabetes mellitus without  complication (HCC)   . Hepatitis C   . Homelessness   . Homelessness   . Hypertension   . Neuropathy (HCC)   . Polysubstance abuse   . Schizophrenia (HCC)    History reviewed. No pertinent surgical history. Family History:  Family History  Problem Relation Age of Onset  . Hypertension Other   . Diabetes Other    Family Psychiatric  History: Please see H&P.  Social History: Please see H&P.  History  Alcohol Use  . Yes    Comment: Daily Drinker      History  Drug Use  . Frequency: 7.0 times per week  . Types: "Crack" cocaine, Cocaine, Marijuana    Comment: Cocaine tonight, Marijuana "a long time"    Social History   Social History  . Marital status: Divorced    Spouse name: N/A  . Number of children: N/A  . Years of education: N/A   Social History Main Topics  . Smoking status: Current Every Day Smoker    Packs/day: 1.00    Years: 20.00    Types: Cigarettes  . Smokeless tobacco: Current User  . Alcohol use Yes     Comment: Daily Drinker   . Drug use: Yes    Frequency: 7.0 times per week    Types: "Crack" cocaine, Cocaine, Marijuana     Comment: Cocaine tonight, Marijuana "a long time"  . Sexual activity: No   Other Topics Concern  . None   Social History Narrative   ** Merged History Encounter **       Additional Social History:  Sleep: Fair  Appetite:  Fair  Current Medications: Current Facility-Administered Medications  Medication Dose Route Frequency Provider Last Rate Last Dose  . acetaminophen (TYLENOL) tablet 650 mg  650 mg Oral Q6H PRN Jackelyn Poling, NP   650 mg at 10/01/16 1149  . alum & mag hydroxide-simeth (MAALOX/MYLANTA) 200-200-20 MG/5ML suspension 15 mL  15 mL Oral Q4H PRN Jackelyn Poling, NP      . amLODipine (NORVASC) tablet 5 mg  5 mg Oral Daily Jomarie Longs, MD   5 mg at 10/01/16 0829  . asenapine (SAPHRIS) sublingual tablet 5 mg  5 mg Sublingual BID PRN Jomarie Longs, MD      . buPROPion  (WELLBUTRIN) tablet 75 mg  75 mg Oral Daily Allen Egerton, MD   75 mg at 10/01/16 1149  . hydrOXYzine (ATARAX/VISTARIL) tablet 25 mg  25 mg Oral Q6H PRN Jomarie Longs, MD   25 mg at 10/01/16 1149  . ibuprofen (ADVIL,MOTRIN) tablet 400 mg  400 mg Oral TID Jomarie Longs, MD      . lamoTRIgine (LAMICTAL) tablet 25 mg  25 mg Oral Daily Jomarie Longs, MD   25 mg at 10/01/16 0829  . LORazepam (ATIVAN) injection 1 mg  1 mg Intravenous Once Sanjuana Kava, NP      . magnesium hydroxide (MILK OF MAGNESIA) suspension 30 mL  30 mL Oral Daily PRN Jackelyn Poling, NP   30 mL at 10/01/16 1154  . nicotine (NICODERM CQ - dosed in mg/24 hours) patch 14 mg  14 mg Transdermal Daily Miroslav Gin, MD   14 mg at 10/01/16 0800  . OLANZapine (ZYPREXA) tablet 7.5 mg  7.5 mg Oral QHS Jomarie Longs, MD      . tuberculin injection 5 Units  5 Units Intradermal Once Jomarie Longs, MD        Lab Results:  Results for orders placed or performed during the hospital encounter of 09/29/16 (from the past 48 hour(s))  Hemoglobin A1c     Status: Abnormal   Collection Time: 09/30/16  6:05 AM  Result Value Ref Range   Hgb A1c MFr Bld 5.8 (H) 4.8 - 5.6 %    Comment: (NOTE)         Pre-diabetes: 5.7 - 6.4         Diabetes: >6.4         Glycemic control for adults with diabetes: <7.0    Mean Plasma Glucose 120 mg/dL    Comment: (NOTE) Performed At: [facility] [address] [place], [place] [id number] Mila Homer MD WU:[id number] Performed at [facility], [address], [place], [place] [address]   Lipid panel     Status: None   Collection Time: 09/30/16  6:05 AM  Result Value Ref Range   Cholesterol 136 0 - 200 mg/dL   Triglycerides 65 <308 mg/dL   HDL 47 >65 mg/dL   Total CHOL/HDL Ratio 2.9 RATIO   VLDL 13 0 - 40 mg/dL   LDL Cholesterol 76 0 - 99 mg/dL    Comment:        Total Cholesterol/HDL:CHD Risk Coronary Heart Disease Risk Table                     Men   Women  1/2  Average Risk   3.4   3.3  Average Risk       5.0   4.4  2 X Average Risk   9.6   7.1  3 X Average Risk  23.4   11.0        Use the calculated Patient Ratio above and the CHD Risk Table to determine the patient's CHD Risk.        ATP III CLASSIFICATION (LDL):  <100     mg/dL   Optimal  161-096  mg/dL   Near or Above                    Optimal  130-159  mg/dL   Borderline  045-409  mg/dL   High  >811     mg/dL   Very High Performed at [facility], [address] [place], [place] [address]   TSH     Status: None   Collection Time: 09/30/16  6:05 AM  Result Value Ref Range   TSH 0.408 0.350 - 4.500 uIU/mL    Comment: Performed by a 3rd Generation assay with a functional sensitivity of <=0.01 uIU/mL. Performed at [facility], [address], [place], [place] [address]   Prolactin     Status: Abnormal   Collection Time: 09/30/16  6:05 AM  Result Value Ref Range   Prolactin 19.7 (H) 4.0 - 15.2 ng/mL    Comment: (NOTE) Performed At: [facility] [address] [place], [place] [id number] Mila Homer MD ON:[id number] Performed at [facility], [address], [place], [place] [address]     Blood Alcohol level:  Lab Results  Component Value Date   [facility] <5 09/29/2016   ETH <5 07/29/2015    Metabolic Disorder Labs: Lab Results  Component Value Date   HGBA1C 5.8 (H) 09/30/2016   MPG 120 09/30/2016   MPG 128 07/29/2014   Lab Results  Component Value Date   PROLACTIN 19.7 (H) 09/30/2016   Lab Results  Component Value Date   CHOL 136 09/30/2016   TRIG 65 09/30/2016   HDL 47 09/30/2016   CHOLHDL 2.9 09/30/2016   VLDL 13 09/30/2016   LDLCALC 76 09/30/2016   LDLCALC 91 01/21/2015    Physical Findings: AIMS:  , ,  ,  ,    CIWA:    COWS:     Musculoskeletal: Strength & Muscle Tone: within normal limits Gait & Station: normal Patient leans: N/A  Psychiatric Specialty Exam: Physical Exam  Nursing note  and vitals reviewed.   Review of Systems  Psychiatric/Behavioral: Positive for depression. The patient is nervous/anxious.   All other systems reviewed and are negative.   Blood pressure (!) 165/82, pulse 86, temperature 98.9 F (37.2 C), temperature source Oral, resp. rate 20, height  (1.727 m), weight 76.2 kg (168 lb), SpO2 97 %.Body mass index is 25.54 kg/m.  General Appearance: Guarded  Eye Contact:  Fair  Speech:  Pressured  Volume:  Normal  Mood:  Anxious, Depressed and Dysphoric  Affect:  Congruent  Thought Process:  Goal Directed and Descriptions of Associations: Circumstantial  Orientation:  Full (Time, Place, and Person)  Thought Content:  Hallucinations: Visual and Rumination  Suicidal Thoughts:  No  Homicidal Thoughts:  No  Memory:  Immediate;   Fair Recent;   Fair Remote;   Fair  Judgement:  Impaired  Insight:  Shallow  Psychomotor Activity:  Restlessness  Concentration:  Concentration: Fair and Attention Span: Fair  Recall:  [employer] of Knowledge:  Fair  Language:  Fair  Akathisia:  No  Handed:  Right  AIMS (if indicated):  Assets:  Desire for Improvement  ADL's:  Intact  Cognition:  WNL  Sleep:  Number of Hours: 6   Schizoaffective disorder, bipolar type (HCC) unstable  Will continue today 10/01/16 plan as below except where it is noted.    Treatment Plan Summary:Patient today with depressive sx , preoccupied with his hand , O/E mild generalized swelling , will continue treatment , provide symptomatic treatment for his hands.   Daily contact with patient to assess and evaluate symptoms and progress in treatment, Medication management and Plan see below   Will continue Lamictal 25 mg po daily for mood sx. Will reduce Zyprexa to 7.5 mg po qhs for psychosis. Dose reduced due to lethargy. Will start Wellbutrin 75 mg po daily for affective sx. Will continue Vistaril 25 mg po tid prn for anxiety sx. Saphris 5 mg po bid prn for severe  anxiety/agitation. Apply ice to hand for swelling. Provide motrin 600 mg po tid for pain. Provided substance abuse counseling. Provide PPD test today - 10/01/2016 - for placement. Labs - will repeat BMP. CSW will continue to work on disposition.    Jalien Weakland, MD 10/01/2016, 3:30 PM

## 2016-10-01 NOTE — Progress Notes (Addendum)
DATA ACTION RESPONSE  Objective-Pt. is visible in his room, seen reading a bible. Presents with an anxious/labileaffect and mood. Appears restless and is constantly changing clothes. Religious preoccupied.  Subjective-Denies having any SI/HI/AVH at this time. Rates pain 4/10; teeth and R. Hand.Slightly swollen. Ice pack given and applied. Pt. states " I just want you to do what you've been doing; I want my medications last. They are sicker than me". Continues to remain safe on the unit. Needs re-direction at times but is receptive.  1:1 interaction in private to establish rapport. Encouragement, education, &support given from staff. Meds. ordered and administered. PRN Tylenol and Vistarilrequested and will re-eval accordingly.  Safety maintained with Q 15 checks. Continues to follow treatment plan and will monitor closely. No additonal questions/concerns noted.

## 2016-10-02 MED ORDER — TRAZODONE HCL 50 MG PO TABS
50.0000 mg | ORAL_TABLET | Freq: Every day | ORAL | Status: DC
  Administered 2016-10-02 – 2016-10-03 (×2): 50 mg via ORAL
  Filled 2016-10-02 (×3): qty 1

## 2016-10-02 MED ORDER — BENZOCAINE 10 % MT GEL
Freq: Four times a day (QID) | OROMUCOSAL | Status: DC | PRN
  Administered 2016-10-02 – 2016-10-03 (×2): via OROMUCOSAL
  Filled 2016-10-02: qty 9.4

## 2016-10-02 MED ORDER — AMLODIPINE BESYLATE 10 MG PO TABS
10.0000 mg | ORAL_TABLET | Freq: Every day | ORAL | Status: DC
  Administered 2016-10-03 – 2016-10-04 (×2): 10 mg via ORAL
  Filled 2016-10-02 (×3): qty 1

## 2016-10-02 NOTE — [facility] Group Notes (Signed)
[facility] Group Notes:  (Counselor/Nursing/MHT/Case Management/Adjunct)  10/02/2016 1:15PM  Type of Therapy:  Group Therapy  Participation Level:  Active  Participation Quality:  Appropriate  Affect:  Flat  Cognitive:  Oriented  Insight:  Improving  Engagement in Group:  Limited  Engagement in Therapy:  Limited  Modes of Intervention:  Discussion, Exploration and Socialization  Summary of Progress/Problems: The topic for group was balance in life.  Pt participated in the discussion about when their life was in balance and out of balance and how this feels.  Pt discussed ways to get back in balance and short term goals they can work on to get where they want to be.  Invited.  Chose to not attend.   Daryel Gerald B 10/02/2016 1:54 PM

## 2016-10-02 NOTE — Progress Notes (Signed)
Adult Psychoeducational Group Note  Date:  10/02/2016 Time:  8:59 PM  Group Topic/Focus:  Wrap-Up Group:   The focus of this group is to help patients review their daily goal of treatment and discuss progress on daily workbooks.  Participation Level:  Did Not Attend  Participation Quality:  Did not attend  Affect:  Did not attend  Cognitive:  Did not attend  Insight: None  Engagement in Group:  Did not attend  Modes of Intervention:  Did not attend  Additional Comments: Patient did not attend wrap up group this evening.  Candi Profit L Baeleigh Devincent 10/02/2016, 8:59 PM

## 2016-10-02 NOTE — Progress Notes (Signed)
Recreation Therapy Notes  Date: 10/02/16 Time: 1000 Location: 500 Hall Dayroom  Group Topic: Self-Esteem  Goal Area(s) Addresses:  Patient will identify positive ways to increase self-esteem. Patient will verbalize benefit of increased self-esteem.  Intervention: Holiday representative paper, colored pencils  Activity: Personalized License Plate.  Patients were given construction paper and colored pencils and create a personalized license plate.  The license plate was to highlight the good qualities and traits about each person.  Education:  Self-Esteem, Building control surveyor.   Education Outcome: Acknowledges education/In group clarification offered/Needs additional education  Clinical Observations/Feedback: Pt did not attend group.   Caroll Rancher, LRT/CTRS         Lillia Abed, Malikai Gut A 10/02/2016 1:05 PM

## 2016-10-02 NOTE — Progress Notes (Signed)
D: Pt denies SI and HI. He has been observed RIS. He did not sleep well last night. He was reportedly irritable prior to shift. However, he has been pleasant and cooperative with this Clinical research associate. His speech has been circumstantial and pressured. He has not created any milieu management problems. He is compliant with medications. On his self inventory, he reported fair sleep, fair appetite, low energy level, and good concentration. He rated his depression 5/10, hopelessness 4/10, and anxiety 4/10.   A: Meds given as ordered. Q15 safety checks maintained. Support/encouragement offered.  R: Pt remains free from harm and continues with treatment. Will continue to monitor for needs/safety.

## 2016-10-02 NOTE — Progress Notes (Addendum)
   10/02/16 0500  Sleep  Number of Hours 1.75   Pt. appears restless and bizarre. Remains labile. Seen sitting in bench in room reading bible and talking to self. + internal stimuli? Pt. states "I slept all day yesterday; what does my sleep have to do with everyone else in the world"? Around 0530, Pt. went to the back of the hall and was seen cleaning and wiping down the windows with a towel. No issues or c/o.Will monitor.PRN tylenol and vistaril requested and will re-eval. Refused Ibuprofen this morning.

## 2016-10-02 NOTE — Progress Notes (Signed)
[facility] MD Progress Note  10/02/2016 2:57 PM [name]  MRN:  [id number] Subjective: Pt states " I am ok, I did not sleep last night.'   Objective:Patient seen and chart reviewed.Discussed patient with treatment team.  Pt today seen as pressured , however less tired , less irritable than yesterday. He is not too preoccupied with his hand today , is on symptomatic treatment for pain, which is reducing. Per RN , He did not sleep last night and when discussed adding Trazodone , he agrees. Will continue to monitor.       Principal Problem: Schizoaffective disorder, bipolar type ([place]) Diagnosis:   Patient Active Problem List   Diagnosis Date Noted  . Schizoaffective disorder, bipolar type ([place]) [F25.0] 09/30/2016  . Suicidal ideations [R45.851]   . Substance induced mood disorder ([place]) [F19.94] 03/13/2015  . Acute kidney failure ([place]) [N17.9] 01/26/2015  . Schizophrenia, paranoid type ([place]) [F20.0] 01/17/2015  . Suicidal ideation [R45.851]   . Drug hallucinosis ([place]) [F19.951] 10/08/2014  . Chronic paranoid schizophrenia ([place]) [F20.0] 09/07/2014  . Substance or medication-induced bipolar and related disorder with onset during intoxication ([place]) [F19.94] 08/10/2014  . Acute CHF (congestive heart failure) ([place]) [I50.9] 07/29/2014  . Cocaine use disorder, severe, dependence ([place]) [F14.20]   . Essential hypertension, benign [I10] 03/28/2013  . Diabetes mellitus ([place]) [E11.9] 03/15/2013   Total Time spent with patient: 20 minutes  Past Psychiatric History: Please see H&P.   Past Medical History:  Past Medical History:  Diagnosis Date  . CHF (congestive heart failure) ([place])   . Chronic foot pain   . Cocaine abuse   . Diabetes mellitus without complication ([place])   . Hepatitis C   . Homelessness   . Homelessness   . Hypertension   . Neuropathy ([place])   . Polysubstance abuse   . Schizophrenia ([place])    History reviewed. No pertinent surgical history. Family History:   Family History  Problem Relation Age of Onset  . Hypertension Other   . Diabetes Other    Family Psychiatric  History: Please see H&P.  Social History: Please see H&P.  History  Alcohol Use  . Yes    Comment: Daily Drinker      History  Drug Use  . Frequency: 7.0 times per week  . Types: "Crack" cocaine, Cocaine, Marijuana    Comment: Cocaine tonight, Marijuana "a long time"    Social History   Social History  . Marital status: Divorced    Spouse name: N/A  . Number of children: N/A  . Years of education: N/A   Social History Main Topics  . Smoking status: Current Every Day Smoker    Packs/day: 1.00    Years: 20.00    Types: Cigarettes  . Smokeless tobacco: Current User  . Alcohol use Yes     Comment: Daily Drinker   . Drug use: Yes    Frequency: 7.0 times per week    Types: "Crack" cocaine, Cocaine, Marijuana     Comment: Cocaine tonight, Marijuana "a long time"  . Sexual activity: No   Other Topics Concern  . None   Social History Narrative   ** Merged History Encounter **       Additional Social History:                         Sleep: Poor  Appetite:  Fair  Current Medications: Current Facility-Administered Medications  Medication Dose Route Frequency Provider Last Rate Last  Dose  . acetaminophen (TYLENOL) tablet 650 mg  650 mg Oral Q6H PRN Rozetta Nunnery, NP   650 mg at 10/02/16 1057  . alum & mag hydroxide-simeth (MAALOX/MYLANTA) 200-200-20 MG/5ML suspension 15 mL  15 mL Oral Q4H PRN Rozetta Nunnery, NP      . Derrill Memo ON 10/03/2016] amLODipine (NORVASC) tablet 10 mg  10 mg Oral Daily Alhassan Everingham, MD      . asenapine (SAPHRIS) sublingual tablet 5 mg  5 mg Sublingual BID PRN Ursula Alert, MD      . buPROPion (WELLBUTRIN) tablet 75 mg  75 mg Oral Daily Braelynne Garinger, MD   75 mg at 10/02/16 9735  . hydrOXYzine (ATARAX/VISTARIL) tablet 25 mg  25 mg Oral Q6H PRN Ursula Alert, MD   25 mg at 10/02/16 0430  . ibuprofen (ADVIL,MOTRIN) tablet  400 mg  400 mg Oral TID WC Jaella Weinert, MD   400 mg at 10/01/16 1700  . lamoTRIgine (LAMICTAL) tablet 25 mg  25 mg Oral Daily Ursula Alert, MD   25 mg at 10/02/16 0821  . LORazepam (ATIVAN) injection 1 mg  1 mg Intravenous Once Encarnacion Slates, NP      . magnesium hydroxide (MILK OF MAGNESIA) suspension 30 mL  30 mL Oral Daily PRN Rozetta Nunnery, NP   30 mL at 10/01/16 1154  . nicotine (NICODERM CQ - dosed in mg/24 hours) patch 14 mg  14 mg Transdermal Daily Ursula Alert, MD   14 mg at 10/02/16 0824  . OLANZapine (ZYPREXA) tablet 7.5 mg  7.5 mg Oral QHS Charnell Peplinski, MD   7.5 mg at 10/01/16 2150  . traZODone (DESYREL) tablet 50 mg  50 mg Oral QHS Ursula Alert, MD      . tuberculin injection 5 Units  5 Units Intradermal Once Ursula Alert, MD   5 Units at 10/01/16 1600    Lab Results:  Results for orders placed or performed during the hospital encounter of 09/29/16 (from the past 48 hour(s))  Basic metabolic panel     Status: Abnormal   Collection Time: 10/01/16  6:16 PM  Result Value Ref Range   Sodium 141 135 - 145 mmol/L   Potassium 3.7 3.5 - 5.1 mmol/L   Chloride 106 101 - 111 mmol/L   CO2 27 22 - 32 mmol/L   Glucose, Bld 183 (H) 65 - 99 mg/dL   BUN 19 6 - 20 mg/dL   Creatinine, Ser 0.92 0.61 - 1.24 mg/dL   Calcium 8.8 (L) 8.9 - 10.3 mg/dL   GFR calc non Af Amer >60 >60 mL/min   GFR calc Af Amer >60 >60 mL/min    Comment: (NOTE) The eGFR has been calculated using the CKD EPI equation. This calculation has not been validated in all clinical situations. eGFR's persistently <60 mL/min signify possible Chronic Kidney Disease.    Anion gap 8 5 - 15    Comment: Performed at [facility], [place] [address], [place], [place] [address]    Blood Alcohol level:  Lab Results  Component Value Date   [facility] <5 09/29/2016   ETH <5 42/68/3419    Metabolic Disorder Labs: Lab Results  Component Value Date   HGBA1C 5.8 (H) 09/30/2016   MPG 120 09/30/2016   MPG 128  07/29/2014   Lab Results  Component Value Date   PROLACTIN 19.7 (H) 09/30/2016   Lab Results  Component Value Date   CHOL 136 09/30/2016   TRIG 65 09/30/2016  HDL 47 09/30/2016   CHOLHDL 2.9 09/30/2016   VLDL 13 09/30/2016   LDLCALC 76 09/30/2016   LDLCALC 91 01/21/2015    Physical Findings: AIMS:  , ,  ,  ,    CIWA:    COWS:     Musculoskeletal: Strength & Muscle Tone: within normal limits Gait & Station: normal Patient leans: N/A  Psychiatric Specialty Exam: Physical Exam  Nursing note and vitals reviewed.   Review of Systems  Psychiatric/Behavioral: Positive for depression and substance abuse. The patient is nervous/anxious and has insomnia.   All other systems reviewed and are negative.   Blood pressure (!) 150/80, pulse 89, temperature 98.4 F (36.9 C), temperature source Oral, resp. rate 18, height _0  (1.727 m), weight 76.2 kg (168 lb), SpO2 97 %.Body mass index is 25.54 kg/m.  General Appearance: Guarded  Eye Contact:  Fair  Speech:  Pressured  Volume:  Normal  Mood:  Anxious, Depressed and Dysphoric  Affect:  Congruent  Thought Process:  Goal Directed and Descriptions of Associations: Circumstantial  Orientation:  Full (Time, Place, and Person)  Thought Content:  Hallucinations: Visual and Rumination, improving  Suicidal Thoughts:  No  Homicidal Thoughts:  No  Memory:  Immediate;   Fair Recent;   Fair Remote;   Fair  Judgement:  Fair  Insight:  Shallow  Psychomotor Activity:  Restlessness  Concentration:  Concentration: Fair and Attention Span: Fair  Recall:  [employer] of Knowledge:  Fair  Language:  Fair  Akathisia:  No  Handed:  Right  AIMS (if indicated):     Assets:  Desire for Improvement  ADL's:  Intact  Cognition:  WNL  Sleep:  Number of Hours: 1.75   Schizoaffective disorder, bipolar type ([place]) unstable- improving  Will continue today 10/02/16  plan as below except where it is noted.    Treatment Plan Summary:Patient today  seen as less irritable , however has sleep issues. He has signed a 72 hr application for DC on 09/01/27.    Daily contact with patient to assess and evaluate symptoms and progress in treatment, Medication management and Plan see below   Will continue Lamictal 25 mg po daily for mood sx. Will reduce Zyprexa to 7.5 mg po qhs for psychosis. Dose reduced due to lethargy.  Wellbutrin 75 mg po daily for affective sx. Will continue Vistaril 25 mg po tid prn for anxiety sx. Saphris 5 mg po bid prn for severe anxiety/agitation. Will add Trazodone 50 mg po qhs for sleep. Apply ice to hand for swelling. Provide motrin 600 mg po tid for pain. Provided substance abuse counseling. Provide PPD test  - 10/01/2016 - for placement. Labs reviewed - BMP -wnl CSW will continue to work on disposition.    Kaylene Dawn, MD 10/02/2016, 2:57 PM

## 2016-10-03 NOTE — Progress Notes (Signed)
Patient ID: [name], male   DOB: December 22, 1960, 56 y.o.   MRN: [id number] D: Client visible on the unit, restless and agitated reports "I'm suppose to be going home tomorrow" "I'm mad about financial situation" "what do I do about that" "Hey Ms. I don't want that tech to talk to me I just want you and these guys, some women will get you in trouble, with their smart mouth" "every tech don't care about you" Client begins to escalate and ask for something for anxiety. A: Writer provided emotional support encouraged client to use coping skills and focus on plan for finances. Medications reviewed, administered as ordered. Hydroxyzine 25 mg administered for complaints of anxiety. Staff will monitor q83min for safety. R: Client is safe on the unit, attended karaoke.

## 2016-10-03 NOTE — Progress Notes (Signed)
[facility] MD Progress Note  10/03/2016 12:19 PM [name]  MRN:  [id number] Subjective: Pt states " I am ok, I am ready to go tomorrow.  I have to meet my parole officer at 4."  Objective:  Patient seen and chart reviewed.Discussed patient with treatment team.  Pt today seen.  Calm but anxiety heightened when his medications and discharge were discussed.  Educated on indications of each of his meds and importance of compliance to control symptoms.  Will continue to monitor.   Principal Problem: Schizoaffective disorder, bipolar type ([place]) Diagnosis:   Patient Active Problem List   Diagnosis Date Noted  . Schizoaffective disorder, bipolar type ([place]) [F25.0] 09/30/2016  . Suicidal ideations [R45.851]   . Substance induced mood disorder ([place]) [F19.94] 03/13/2015  . Acute kidney failure ([place]) [N17.9] 01/26/2015  . Schizophrenia, paranoid type ([place]) [F20.0] 01/17/2015  . Suicidal ideation [R45.851]   . Drug hallucinosis ([place]) [F19.951] 10/08/2014  . Chronic paranoid schizophrenia ([place]) [F20.0] 09/07/2014  . 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 Acute CHF (congestive heart failure) ([place]) [I50.9] 07/29/2014  . Cocaine use disorder, severe, dependence ([place]) [F14.20]   . Essential hypertension, benign [I10] 03/28/2013  . Diabetes mellitus ([place]) [E11.9] 03/15/2013   Total Time spent with patient: 20 minutes  Past Psychiatric History: Please see H&P.   Past Medical History:  Past Medical History:  Diagnosis Date  . CHF (congestive heart failure) ([place])   . Chronic foot pain   . Cocaine abuse   . Diabetes mellitus without complication ([place])   . Hepatitis C   . Homelessness   . Homelessness   . Hypertension   . Neuropathy ([place])   . Polysubstance abuse   . Schizophrenia ([place])    History reviewed. No pertinent surgical history. Family History:  Family History  Problem Relation Age of Onset  .  Hypertension Other   . Diabetes Other    Family Psychiatric  History: Please see H&P.  Social History: Please see H&P.  History  Alcohol Use  . Yes    Comment: Daily Drinker      History  Drug Use  . Frequency: 7.0 times per week  . Types: "Crack" cocaine, Cocaine, Marijuana    Comment: Cocaine tonight, Marijuana "a long time"    Social History   Social History  . Marital status: Divorced    Spouse name: N/A  . Number of children: N/A  . Years of education: N/A   Social History Main Topics  . Smoking status: Current Every Day Smoker    Packs/day: 1.00    Years: 20.00    Types: Cigarettes  . Smokeless tobacco: Current User  . Alcohol use Yes     Comment: Daily Drinker   . Drug use: Yes    Frequency: 7.0 times per week    Types: "Crack" cocaine, Cocaine, Marijuana     Comment: Cocaine tonight, Marijuana "a long time"  . Sexual activity: No   Other Topics Concern  . None   Social History Narrative   ** Merged History Encounter **       Additional Social History:                         Sleep: Poor  Appetite:  Fair  Current Medications: Current Facility-Administered Medications  Medication Dose Route Frequency Provider Last Rate Last Dose  . acetaminophen (TYLENOL) tablet 650 mg  650 mg  Oral Q6H PRN Rozetta Nunnery, NP   650 mg at 10/03/16 [phone]  . alum & mag hydroxide-simeth (MAALOX/MYLANTA) 200-200-20 MG/5ML suspension 15 mL  15 mL Oral Q4H PRN Rozetta Nunnery, NP      . amLODipine (NORVASC) tablet 10 mg  10 mg Oral Daily Ursula Alert, MD   10 mg at 10/03/16 0852  . asenapine (SAPHRIS) sublingual tablet 5 mg  5 mg Sublingual BID PRN Ursula Alert, MD      . benzocaine (ORAJEL) 10 % mucosal gel   Mouth/Throat QID PRN Laverle Hobby, PA-C      . buPROPion [facility]) tablet 75 mg  75 mg Oral Daily Saramma Eappen, MD   75 mg at 10/03/16 0852  . hydrOXYzine (ATARAX/VISTARIL) tablet 25 mg  25 mg Oral Q6H PRN Ursula Alert, MD   25 mg at 10/02/16 2230   . ibuprofen (ADVIL,MOTRIN) tablet 400 mg  400 mg Oral TID WC Ursula Alert, MD   400 mg at 10/03/16 1205  . lamoTRIgine (LAMICTAL) tablet 25 mg  25 mg Oral Daily Ursula Alert, MD   25 mg at 10/03/16 0852  . LORazepam (ATIVAN) injection 1 mg  1 mg Intravenous Once Encarnacion Slates, NP      . magnesium hydroxide (MILK OF MAGNESIA) suspension 30 mL  30 mL Oral Daily PRN Rozetta Nunnery, NP   30 mL at 10/03/16 0853  . nicotine (NICODERM CQ - dosed in mg/24 hours) patch 14 mg  14 mg Transdermal Daily Saramma Eappen, MD   14 mg at 10/03/16 0800  . OLANZapine (ZYPREXA) tablet 7.5 mg  7.5 mg Oral QHS Saramma Eappen, MD   7.5 mg at 10/02/16 2230  . traZODone (DESYREL) tablet 50 mg  50 mg Oral QHS Ursula Alert, MD   50 mg at 10/02/16 2230  . tuberculin injection 5 Units  5 Units Intradermal Once Ursula Alert, MD   5 Units at 10/01/16 1600    Lab Results:  Results for orders placed or performed during the hospital encounter of 09/29/16 (from the past 48 hour(s))  Basic metabolic panel     Status: Abnormal   Collection Time: 10/01/16  6:16 PM  Result Value Ref Range   Sodium 141 135 - 145 mmol/L   Potassium 3.7 3.5 - 5.1 mmol/L   Chloride 106 101 - 111 mmol/L   CO2 27 22 - 32 mmol/L   Glucose, Bld 183 (H) 65 - 99 mg/dL   BUN 19 6 - 20 mg/dL   Creatinine, Ser 0.92 0.61 - 1.24 mg/dL   Calcium 8.8 (L) 8.9 - 10.3 mg/dL   GFR calc non Af Amer >60 >60 mL/min   GFR calc Af Amer >60 >60 mL/min    Comment: (NOTE) The eGFR has been calculated using the CKD EPI equation. This calculation has not been validated in all clinical situations. eGFR's persistently <60 mL/min signify possible Chronic Kidney Disease.    Anion gap 8 5 - 15    Comment: Performed at [facility], [place] [address], [place], [place] [address]    Blood Alcohol level:  Lab Results  Component Value Date   [facility] <5 09/29/2016   ETH <5 20/25/4270    Metabolic Disorder Labs: Lab Results  Component Value Date    HGBA1C 5.8 (H) 09/30/2016   MPG 120 09/30/2016   MPG 128 07/29/2014   Lab Results  Component Value Date   PROLACTIN 19.7 (H) 09/30/2016   Lab Results  Component  Value Date   CHOL 136 09/30/2016   TRIG 65 09/30/2016   HDL 47 09/30/2016   CHOLHDL 2.9 09/30/2016   VLDL 13 09/30/2016   LDLCALC 76 09/30/2016   LDLCALC 91 01/21/2015    Physical Findings: AIMS:  , ,  ,  ,    CIWA:    COWS:     Musculoskeletal: Strength & Muscle Tone: within normal limits Gait & Station: normal Patient leans: N/A  Psychiatric Specialty Exam: Physical Exam  Nursing note and vitals reviewed.   Review of Systems  Psychiatric/Behavioral: Positive for depression and substance abuse. The patient is nervous/anxious and has insomnia.   All other systems reviewed and are negative.   Blood pressure (!) 177/92, pulse 92, temperature 99.4 F (37.4 C), resp. rate 18, height '5\' 8"'  (1.727 m), weight 76.2 kg (168 lb), SpO2 97 %.Body mass index is 25.54 kg/m.  General Appearance: Guarded  Eye Contact:  Fair  Speech:  Pressured  Volume:  Normal  Mood:  Anxious, Depressed and Dysphoric  Affect:  Congruent  Thought Process:  Goal Directed and Descriptions of Associations: Circumstantial  Orientation:  Full (Time, Place, and Person)  Thought Content:  Hallucinations: Visual and Rumination, improving  Suicidal Thoughts:  No  Homicidal Thoughts:  No  Memory:  Immediate;   Fair Recent;   Fair Remote;   Fair  Judgement:  Fair  Insight:  Shallow  Psychomotor Activity:  Normal  Concentration:  Concentration: Fair and Attention Span: Fair  Recall:  [employer] of Knowledge:  Fair  Language:  Fair  Akathisia:  No  Handed:  Right  AIMS (if indicated):     Assets:  Desire for Improvement  ADL's:  Intact  Cognition:  WNL  Sleep:  Number of Hours: 3.25   Schizoaffective disorder, bipolar type ([place]) unstable- improving  Will continue today 10/03/16  plan as below except where it is noted.  Treatment  Plan Summary:Patient today seen as less irritable , however has sleep issues. He has signed a 72 hr application for DC on 11/06/50.  Daily contact with patient to assess and evaluate symptoms and progress in treatment, Medication management and Plan see below.   Will continue Lamictal 25 mg po daily for mood sx. Will cont Zyprexa 7.5 mg po qhs for psychosis. Dose reduced due to lethargy. Wellbutrin 75 mg po daily for affective sx. Will continue Vistaril 25 mg po tid prn for anxiety sx. Saphris 5 mg po bid prn for severe anxiety/agitation. Will add Trazodone 50 mg po qhs for sleep. Apply ice to hand for swelling. Provide motrin 600 mg po tid for pain. Provided substance abuse counseling. Provide PPD test  - 10/01/2016 - for placement. Labs reviewed - BMP -wnl CSW will continue to work on disposition.  Janett Labella, NP [facility] 10/03/2016, 12:19 PM   Agree with NP progress note

## 2016-10-03 NOTE — [facility] Group Notes (Signed)
Type of Therapy:  Group Therapy   Participation Level:  Engaged  Participation Quality:  Attentive  Affect:  Appropriate   Cognitive:  Alert   Insight:  Engaged  Engagement in Therapy:  Improving   Mode s of Intervention:  Education, Exploration, Socialization   Summary of Progress/Problems: [name] attended group for 10 minutes, but soon left. He never returned.   Tammi from [employer] was here to tell her story of recovery and inform patients about [facility] and their services.

## 2016-10-03 NOTE — Tx Team (Signed)
Interdisciplinary Treatment and Diagnostic Plan Update  10/03/2016 Time of Session: 10:29 AM  [name] MRN: [id number]  Principal Diagnosis: Schizoaffective disorder, bipolar type (HCC)  Secondary Diagnoses: Principal Problem:   Schizoaffective disorder, bipolar type (HCC) Active Problems:   Cocaine use disorder, severe, dependence (HCC)   Current Medications:  Current Facility-Administered Medications  Medication Dose Route Frequency Provider Last Rate Last Dose  . acetaminophen (TYLENOL) tablet 650 mg  650 mg Oral Q6H PRN Jackelyn Poling, NP   650 mg at 10/03/16 2536  . alum & mag hydroxide-simeth (MAALOX/MYLANTA) 200-200-20 MG/5ML suspension 15 mL  15 mL Oral Q4H PRN Jackelyn Poling, NP      . amLODipine (NORVASC) tablet 10 mg  10 mg Oral Daily Jomarie Longs, MD   10 mg at 10/03/16 0852  . asenapine (SAPHRIS) sublingual tablet 5 mg  5 mg Sublingual BID PRN Jomarie Longs, MD      . benzocaine (ORAJEL) 10 % mucosal gel   Mouth/Throat QID PRN Kerry Hough, PA-C      . buPROPion [facility]) tablet 75 mg  75 mg Oral Daily Saramma Eappen, MD   75 mg at 10/03/16 0852  . hydrOXYzine (ATARAX/VISTARIL) tablet 25 mg  25 mg Oral Q6H PRN Jomarie Longs, MD   25 mg at 10/02/16 2230  . ibuprofen (ADVIL,MOTRIN) tablet 400 mg  400 mg Oral TID WC Jomarie Longs, MD   400 mg at 10/02/16 1606  . lamoTRIgine (LAMICTAL) tablet 25 mg  25 mg Oral Daily Jomarie Longs, MD   25 mg at 10/03/16 0852  . LORazepam (ATIVAN) injection 1 mg  1 mg Intravenous Once Sanjuana Kava, NP      . magnesium hydroxide (MILK OF MAGNESIA) suspension 30 mL  30 mL Oral Daily PRN Jackelyn Poling, NP   30 mL at 10/03/16 0853  . nicotine (NICODERM CQ - dosed in mg/24 hours) patch 14 mg  14 mg Transdermal Daily Saramma Eappen, MD   14 mg at 10/03/16 0800  . OLANZapine (ZYPREXA) tablet 7.5 mg  7.5 mg Oral QHS Saramma Eappen, MD   7.5 mg at 10/02/16 2230  . traZODone (DESYREL) tablet 50 mg  50 mg Oral QHS Jomarie Longs, MD    50 mg at 10/02/16 2230  . tuberculin injection 5 Units  5 Units Intradermal Once Jomarie Longs, MD   5 Units at 10/01/16 1600    PTA Medications: Prescriptions Prior to Admission  Medication Sig Dispense Refill Last Dose  . amantadine (SYMMETREL) 100 MG capsule Take 1 capsule (100 mg total) by mouth 2 (two) times daily. (Patient not taking: Reported on 07/28/2015) 60 capsule 0 Not Taking at Unknown time  . chlorproMAZINE (THORAZINE) 50 MG tablet Take 1 tablet (50 mg total) by mouth daily. 30 tablet 0 Past Week at Unknown time    Treatment Modalities: Medication Management, Group therapy, Case management,  1 to 1 session with clinician, Psychoeducation, Recreational therapy.   Physician Treatment Plan for Primary Diagnosis: Schizoaffective disorder, bipolar type (HCC) Long Term Goal(s): Improvement in symptoms so as ready for discharge  Short Term Goals: Ability to identify changes in lifestyle to reduce recurrence of condition will improve Ability to disclose and discuss suicidal ideas Ability to demonstrate self-control will improve Ability to demonstrate self-control will improve Ability to identify and develop effective coping behaviors will improve Compliance with prescribed medications will improve Ability to identify triggers associated with substance abuse/mental health issues will improve  Medication Management: Evaluate patient's response,  side effects, and tolerance of medication regimen.  Therapeutic Interventions: 1 to 1 sessions, Unit Group sessions and Medication administration.  Evaluation of Outcomes: Adequate for Discharge  Physician Treatment Plan for Secondary Diagnosis: Principal Problem:   Schizoaffective disorder, bipolar type (HCC) Active Problems:   Cocaine use disorder, severe, dependence (HCC)   Long Term Goal(s): Improvement in symptoms so as ready for discharge  Short Term Goals: Ability to identify changes in lifestyle to reduce recurrence of  condition will improve Ability to disclose and discuss suicidal ideas Ability to demonstrate self-control will improve Ability to demonstrate self-control will improve Ability to identify and develop effective coping behaviors will improve Compliance with prescribed medications will improve Ability to identify triggers associated with substance abuse/mental health issues will improve  Medication Management: Evaluate patient's response, side effects, and tolerance of medication regimen.  Therapeutic Interventions: 1 to 1 sessions, Unit Group sessions and Medication administration.  Evaluation of Outcomes: Adequate for Discharge   RN Treatment Plan for Primary Diagnosis: Schizoaffective disorder, bipolar type (HCC) Long Term Goal(s): Knowledge of disease and therapeutic regimen to maintain health will improve  Short Term Goals: Ability to identify and develop effective coping behaviors will improve and Compliance with prescribed medications will improve  Medication Management: RN will administer medications as ordered by provider, will assess and evaluate patient's response and provide education to patient for prescribed medication. RN will report any adverse and/or side effects to prescribing provider.  Therapeutic Interventions: 1 on 1 counseling sessions, Psychoeducation, Medication administration, Evaluate responses to treatment, Monitor vital signs and CBGs as ordered, Perform/monitor CIWA, COWS, AIMS and Fall Risk screenings as ordered, Perform wound care treatments as ordered.  Evaluation of Outcomes: Adequate for Discharge   Recreational Therapy Treatment Plan for Primary Diagnosis: Schizoaffective disorder, bipolar type (HCC) Long Term Goal(s): Patient will participate in recreation therapy treatment in at least 2 group sessions without prompting from LRT  Short Term Goals: Patient will be able to identify at least 5 coping skills for admitting diagnosis by conclusion of  recreation therapy treatment  Treatment Modalities: Group and Pet Therapy  Therapeutic Interventions: Psychoeducation  Evaluation of Outcomes: Progressing  LCSW Treatment Plan for Primary Diagnosis: Schizoaffective disorder, bipolar type (HCC) Long Term Goal(s): Safe transition to appropriate next level of care at discharge, Engage patient in therapeutic group addressing interpersonal concerns.  Short Term Goals: Engage patient in aftercare planning with referrals and resources and Increase social support  Therapeutic Interventions: Assess for all discharge needs, 1 to 1 time with Social worker, Explore available resources and support systems, Assess for adequacy in community support network, Educate family and significant other(s) on suicide prevention, Complete Psychosocial Assessment, Interpersonal group therapy.  Evaluation of Outcomes: Adequate for Discharge   Progress in Treatment: Attending groups: Yes Participating in groups: Yes Taking medication as prescribed: Yes Toleration medication: Yes, no side effects reported at this time Family/Significant other contact made: No Patient understands diagnosis: Yes AEB asking for help with stability.  "I was out there pissing and pooping my pants.  I was lucky I wasn't killed." Discussing patient identified problems/goals with staff: Yes Medical problems stabilized or resolved: Yes Denies suicidal/homicidal ideation: Yes Issues/concerns per patient self-inventory: None Other: N/A  New problem(s) identified: None identified at this time.   New Short Term/Long Term Goal(s): None identified at this time.   Discharge Plan or Barriers: Asking for referral to "rest home" 4/5: States he wants to stay in "half way house"-but clarifies that he means boarding house, so  he was given a list.  Have also called Ms Earlene Plater to come see him to see if he is interested in staying there.  Follow [facility]  Reason for Continuation of  Hospitalization:    Estimated Length of Stay: [place] d/c tomorrow as pt signed 72 hr request on 4/3  Attendees: Patient: 10/03/2016  10:29 AM  Physician: Jomarie Longs, MD 10/03/2016  10:29 AM  Nursing: Antonieta Iba, RN 10/03/2016  10:29 AM  RN Care Manager: [name], RN 10/03/2016  10:29 AM  Social Worker: Richelle Ito 10/03/2016  10:29 AM  Recreational Therapist: Royston Cowper  10/03/2016  10:29 AM  Other: Tomasita Morrow 10/03/2016  10:29 AM  Other:  10/03/2016  10:29 AM    Scribe for Treatment Team:  Daryel Gerald LCSW 10/03/2016 10:29 AM

## 2016-10-03 NOTE — Progress Notes (Signed)
Recreation Therapy Notes  Date: 10/03/16 Time: 1000 Location: 500 Hall Dayroom  Group Topic: Communication, Team Building, Problem Solving  Goal Area(s) Addresses:  Patient will effectively work with peer towards shared goal.  Patient will identify skill used to make activity successful.  Patient will identify how skills used during activity can be used to reach post d/c goals.   Behavioral Response: Minimal  Intervention: STEM Activity   Activity: [employer]. Patients were provided the following materials: 5 drinking straws, 5 rubber bands, 5 paper clips, 2 index cards, 2 drinking cups, and 2 toilet paper rolls. Using the provided materials patients were asked to build a launching mechanisms to launch a ping pong ball approximately 12 feet. Patients were divided into teams of 3-5.   Education: Pharmacist, community, Building control surveyor.   Education Outcome: Acknowledges education/In group clarification offered/Needs additional education.   Clinical Observations/Feedback: Pt observed and listened as the group came up with a game plan.  Pt became drowsy and started dosing off in group.  Pt left early and did not return.    Caroll Rancher, LRT/CTRS      Caroll Rancher A 10/03/2016 11:39 AM

## 2016-10-03 NOTE — [facility] Suicide Risk Assessment (Signed)
[facility] INPATIENT:  Family/Significant Other Suicide Prevention Education  Suicide Prevention Education:  Patient Refusal for Family/Significant Other Suicide Prevention Education: The patient [name] has refused to provide written consent for family/significant other to be provided Family/Significant Other Suicide Prevention Education during admission and/or prior to discharge.  Physician notified. "I got no one for you to call, Mr Rod. Candace is not my payee, my checks just go there.  I don't want to have anything to do with her anymore.  She tries to run my life."  [name] 10/03/2016, 10:39 AM

## 2016-10-03 NOTE — Progress Notes (Signed)
D: Pt denies SI/HI/AVH. Pt is pleasant and cooperative. Pt seen by other staff talking to people not there.    A: Pt was offered support and encouragement. Pt was given scheduled medications. Pt was encourage to attend groups. Q 15 minute checks were done for safety.   R: Pt is taking medication. Pt has no complaints.Pt receptive to treatment and safety maintained on unit.

## 2016-10-03 NOTE — Progress Notes (Signed)
DAR NOTE: Patient presents with anxious affect and mood.  Patient remained preoccupied with discharge.  Speech is rapid and pressured.  Denies suicidal thoughts, auditory and visual hallucinations.  Described energy level as normal and concentration as good.  Rates depression at 5, hopelessness at 5, and anxiety at 4.  Maintained on routine safety checks.  Medications given as prescribed.  Support and encouragement offered as needed.  Attended group and participated.   Patient is withdrawn and isolative to his room most of this shift.  observed socializing with peers in the dayroom.  Tylenol 650 mg given for complain of toothache with good effect.

## 2016-10-04 MED ORDER — OLANZAPINE 7.5 MG PO TABS: 7.5000 mg | ORAL_TABLET | Freq: Every day | ORAL | 0 refills | Status: DC

## 2016-10-04 MED ORDER — NICOTINE 14 MG/24HR TD PT24: 14.0000 mg | MEDICATED_PATCH | Freq: Every day | TRANSDERMAL | 0 refills | Status: DC

## 2016-10-04 MED ORDER — TRAZODONE HCL 50 MG PO TABS: 50.0000 mg | ORAL_TABLET | Freq: Every day | ORAL | 0 refills | Status: DC

## 2016-10-04 MED ORDER — HYDROXYZINE HCL 25 MG PO TABS: 25.0000 mg | ORAL_TABLET | Freq: Four times a day (QID) | ORAL | 0 refills | Status: DC | PRN

## 2016-10-04 MED ORDER — BUPROPION HCL 75 MG PO TABS: 75.0000 mg | ORAL_TABLET | Freq: Every day | ORAL | 0 refills | Status: DC

## 2016-10-04 MED ORDER — LAMOTRIGINE 25 MG PO TABS: 25.0000 mg | ORAL_TABLET | Freq: Every day | ORAL | 0 refills | Status: DC

## 2016-10-04 MED ORDER — AMLODIPINE BESYLATE 10 MG PO TABS: 10.0000 mg | ORAL_TABLET | Freq: Every day | ORAL | 0 refills | Status: DC

## 2016-10-04 NOTE — Plan of Care (Signed)
Problem: [facility] Participation in Recreation Therapeutic Interventions Goal: STG-Patient will identify at least five coping skills for ** STG: Coping Skills - Patient will be able to identify at least 5 coping skills for paranoia by conclusion of recreation therapy tx  Outcome: Adequate for Discharge Pt attended communication recreation therapy session.  Caroll Rancher, LRT/CTRS

## 2016-10-04 NOTE — Progress Notes (Signed)
D: Patient's self inventory sheet: patient has fair sleep, recieved sleep medication.good  Appetite. Rated hopeless 6/10, anxiety 7/10. SI/HI/AVH: Denies all. Physical complaints are cramping. Goal is 'no drug use, don't take the first puff of crack'. Plans to work on "just maintain".Speech rapid and pressured, demanding clothes from SW closet.    A: Medications administered, assessed medication knowledge and education given on medication regimen.  Emotional support and encouragement given patient. R: Denies SI and HI , contracts for safety. Safety maintained with 15 minute checks.

## 2016-10-04 NOTE — Progress Notes (Signed)
Recreation Therapy Notes  Date: 10/04/16 Time: 1000 Location: 500 Hall Dayroom  Group Topic: Wellness  Goal Area(s) Addresses:  Patient will define components of whole wellness. Patient will verbalize benefit of whole wellness.  Intervention:  20 plastic cups, 2 soft sponge balls  Activity: Bowling.  Patients were divided into two teams.  Patients were attempting to knock over the "pins".  Each patient would get two chances per turn to knock over "pins" unless the bowled a strike.  The team with the highest score at the end of the game wins.  Education: Wellness, Building control surveyor.   Education Outcome: Acknowledges education/In group clarification offered/Needs additional education.   Clinical Observations/Feedback: Pt did not attend group.   Caroll Rancher, LRT/CTRS         Caroll Rancher A 10/04/2016 12:20 PM

## 2016-10-04 NOTE — Progress Notes (Signed)
Patient discharged stable and ambulatory to lobby. All discharge paperwork given and signed, valuables returned. Prescriptions given and medication regimen reviewed. Follow up care instructions reviewed. Patient able to verbalize understanding. Patient stable, denies SI/HI/AVH. Patient given opportunity to express concerns and ask questions.

## 2016-10-04 NOTE — [facility]HH Suicide Risk Assessment (Signed)
[facility] Discharge Suicide Risk Assessment   Principal Problem: Schizoaffective disorder, bipolar type [facility]) Discharge Diagnoses:  Patient Active Problem List   Diagnosis Date Noted  . Schizoaffective disorder, bipolar type (HCC) [F25.0] 09/30/2016  . Suicidal ideations [R45.851]   . Substance induced mood disorder (HCC) [F19.94] 03/13/2015  . Acute kidney failure (HCC) [N17.9] 01/26/2015  . Schizophrenia, paranoid type (HCC) [F20.0] 01/17/2015  . Suicidal ideation [R45.851]   . 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 Acute CHF (congestive heart failure) (HCC) [I50.9] 07/29/2014  . Cocaine use disorder, severe, dependence (HCC) [F14.20]   . Essential hypertension, benign [I10] 03/28/2013  . Diabetes mellitus (HCC) [E11.9] 03/15/2013    Total Time spent with patient: 30 minutes  Musculoskeletal: Strength & Muscle Tone: within normal limits Gait & Station: normal Patient leans: N/A  Psychiatric Specialty Exam: ROS no headache, no chest pain, no shortness of breath, no vomiting   Blood pressure (!) 150/80, pulse 89, temperature 98.4 F (36.9 C), temperature source Oral, resp. rate 18, height  (1.727 m), weight 76.2 kg (168 lb), SpO2 97 %.Body mass index is 25.54 kg/m.  General Appearance: Well Groomed  Eye Contact::  Good  Speech:  Normal Rate409  Volume:  Normal  Mood:  improved mood, feels better than on admission  Affect:  Appropriate  Thought Process:  Linear and Descriptions of Associations: Intact  Orientation:  Other:  fully alert and attentive  Thought Content:  denies hallucinations, no delusions, not internally preoccupied   Suicidal Thoughts:  No denies any suicidal or self injurious ideations, denies any homicidal or violent ideations  Homicidal Thoughts:  No  Memory:  recennt  and remote grossly intact   Judgement:  Other:  improving   Insight:  improving   Psychomotor Activity:  Normal  Concentration:  Good  Recall:  Good  Fund of Knowledge:Good  Language: Good  Akathisia:  Negative  Handed:  Right  AIMS (if indicated):     Assets:  Desire for Improvement Resilience  Sleep:  Number of Hours: 1.75  Cognition: WNL  ADL's:  Intact   Mental Status Per Nursing Assessment::   On Admission:     Demographic Factors:  56 year old single male, no children, on disability , homeless  Loss Factors: Disability, homelessness, relapse   Historical Factors: Has been diagnosed with  Schizophrenia and Schizoaffective Disorder in the past, history of Cocaine  Abuse   Risk Reduction Factors:   Positive coping skills or problem solving skills  Continued Clinical Symptoms:  At this time patient is improved compared to admission, reports feeling better, denies depression, affect is appropriate, reactive, thought process presents better organized, although still becomes  tangential at times , denies suicidal or homicidal ideations, denies any hallucinations, no delusions, does not appear internally preoccupied . As discussed with staff, patient has improved compared to admission, has remained vaguely anxious, pressured at times, but redirectable, calmer, and better related . Denies medication side effects.    Cognitive Features That Contribute To Risk:  No gross cognitive deficits noted upon discharge. Is alert , attentive, and oriented x 3   Suicide Risk:  Mild:  Suicidal ideation of limited frequency, intensity, duration, and specificity.  There are no identifiable plans, no associated intent, mild dysphoria and related symptoms, good self-control (both objective and subjective assessment), few other risk factors, and identifiable protective  factors, including available and accessible social support.  Follow-up Information    [name] Follow up.   Specialty:   Behavioral Health Why:  Go to the walk-in clinic next week between 8 and 11AM for your hospital follow up appointment Contact information: [address] [place] [address] [phone]           Plan Of Care/Follow-up recommendations:  Activity:  as tolerated  Diet:  Heart Healthy Tests:  NA Other:  See below  Patient is wanting discharge and at this time there are no grounds for involuntary commitment  As discussed with his treatment team , CSW, discharged scheduled for today based on patient 's significant improvement  Plans to follow up as above. Referred to [facility] for medical follow ups as needed .  Craige Cotta, MD 10/04/2016, 10:30 AM

## 2016-10-04 NOTE — Progress Notes (Signed)
  [facility] Adult Case Management Discharge Plan :  Will you be returning to the same living situation after discharge:  No. [facility] supported living At discharge, do you have transportation home?: Yes,  bus pass Do you have the ability to pay for your medications: Yes,  MCR  Release of information consent forms completed and in the chart;  Patient's signature needed at discharge.  Patient to Follow up at: Follow-up Information    [name] Follow up.   Specialty:  Behavioral Health Why:  Go to the walk-in clinic next week between 8 and 11AM for your hospital follow up appointment Contact information: [address] [place] [address] [phone]           Next level of care provider has access to [facility] Link:no  Safety Planning and Suicide Prevention discussed: Yes,  yes     Has patient been referred to the Quitline?: Patient refused referral  Patient has been referred for addiction treatment: Yes  Ledell Peoples Smart 10/04/2016, 3:53 PM

## 2016-10-04 NOTE — Discharge Summary (Signed)
Physician Discharge Summary Note  Patient:  [name] is an 56 y.o., male MRN:  [id number] DOB:  1961-05-02 Patient phone:  [phone] (home)  Patient address:   [place] [address],  Total Time spent with patient: 30 minutes  Date of Admission:  09/29/2016 Date of Discharge: 10/04/2016  Reason for Admission:  Worsening depression, SI  Principal Problem: Schizoaffective disorder, bipolar type [facility]) Discharge Diagnoses: Patient Active Problem List   Diagnosis Date Noted  . Schizoaffective disorder, bipolar type (HCC) [F25.0] 09/30/2016  . Suicidal ideations [R45.851]   . Substance induced mood disorder (HCC) [F19.94] 03/13/2015  . Acute kidney failure (HCC) [N17.9] 01/26/2015  . Schizophrenia, paranoid type (HCC) [F20.0] 01/17/2015  . Suicidal ideation [R45.851]   . 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 Acute CHF (congestive heart failure) (HCC) [I50.9] 07/29/2014  . Cocaine use disorder, severe, dependence (HCC) [F14.20]   . Essential hypertension, benign [I10] 03/28/2013  . Diabetes mellitus (HCC) [E11.9] 03/15/2013    Past Psychiatric History: see HPI  Past Medical History:  Past Medical History:  Diagnosis Date  . CHF (congestive heart failure) (HCC)   . Chronic foot pain   . Cocaine abuse   . Diabetes mellitus without complication (HCC)   . Hepatitis C   . Homelessness   . Homelessness   . Hypertension   . Neuropathy (HCC)   . Polysubstance abuse   . Schizophrenia (HCC)    History reviewed. No pertinent surgical history. Family History:  Family History  Problem Relation Age of Onset  . Hypertension Other   . Diabetes Other    Family Psychiatric  History: see HPI Social History:  History  Alcohol Use  . Yes    Comment: Daily Drinker      History   Drug Use  . Frequency: 7.0 times per week  . Types: "Crack" cocaine, Cocaine, Marijuana    Comment: Cocaine tonight, Marijuana "a long time"    Social History   Social History  . Marital status: Divorced    Spouse name: N/A  . Number of children: N/A  . Years of education: N/A   Social History Main Topics  . Smoking status: Current Every Day Smoker    Packs/day: 1.00    Years: 20.00    Types: Cigarettes  . Smokeless tobacco: Current User  . Alcohol use Yes     Comment: Daily Drinker   . Drug use: Yes    Frequency: 7.0 times per week    Types: "Crack" cocaine, Cocaine, Marijuana     Comment: Cocaine tonight, Marijuana "a long time"  . Sexual activity: No   Other Topics Concern  . None   Social History Narrative   ** Merged History Encounter **        Hospital Course:  SMARAN GAUS an 56 y.o.malewho presented unaccompanied to [facility] ED after voicing suicidal ideation to EMS.  [name] was admitted for Schizoaffective disorder, bipolar type (HCC) and crisis management.  Patient was treated with medications with their indications listed below in detail under Medication List.  Medical problems were identified and treated as needed.  Home medications were restarted as appropriate.  Improvement was monitored by observation and [name] daily report of symptom reduction.  Emotional and mental status was monitored by daily self inventory reports completed by [name]  and clinical staff.  Patient reported continued improvement, denied any new concerns.  Patient had been compliant on medications and denied side effects.  Support and encouragement was provided.    Patient encouraged to attend groups to help with recognizing triggers of emotional crises and de-stabilizations.  Patient encouraged to attend group to help identify the positive things in life that would help in dealing with feelings of loss, depression and unhealthy or  abusive tendencies.         [name] was evaluated by the treatment team for stability and plans for continued recovery upon discharge.  Patient was offered further treatment options upon discharge including Residential, Intensive Outpatient and Outpatient treatment. Patient will follow up with agency listed below for medication management and counseling.  Encouraged patient to maintain satisfactory support network and home environment.  Advised to adhere to medication compliance and outpatient treatment follow up.  Prescriptions provided.       [name] motivation was an integral factor for scheduling further treatment.  Employment, transportation, bed availability, health status, family support, and any pending legal issues were also considered during patient's hospital stay.  Upon completion of this admission the patient was both mentally and medically stable for discharge denying suicidal/homicidal ideation, auditory/visual/tactile hallucinations, delusional thoughts and paranoia.       Physical Findings: AIMS: Facial and Oral Movements Muscles of Facial Expression: None, normal Lips and Perioral Area: None, normal Jaw: None, normal Tongue: None, normal,Extremity Movements Upper (arms, wrists, hands, fingers): None, normal Lower (legs, knees, ankles, toes): None, normal, Trunk Movements Neck, shoulders, hips: None, normal, Overall Severity Severity of abnormal movements (highest score from questions above): None, normal Incapacitation due to abnormal movements: None, normal Patient's awareness of abnormal movements (rate only patient's report): No Awareness, Dental Status Current problems with teeth and/or dentures?: Yes Does patient usually wear dentures?: No  CIWA:    COWS:     Musculoskeletal: Strength & Muscle Tone: within normal limits Gait & Station: normal Patient leans: N/A  Psychiatric Specialty Exam:  See MD SRA Physical Exam  Nursing note and  vitals reviewed.   ROS  Blood pressure (!) 150/80, pulse 89, temperature 98.4 F (36.9 C), temperature source Oral, resp. rate 18, height  (1.727 m), weight 76.2 kg (168 lb), SpO2 97 %.Body mass index is 25.54 kg/m.     Has this patient used any form of tobacco in the last 30 days? (Cigarettes, Smokeless Tobacco, Cigars, and/or Pipes) Yes, N/A  Blood Alcohol level:  Lab Results  Component Value Date   ETH <5 09/29/2016   ETH <5 07/29/2015    Metabolic Disorder Labs:  Lab Results  Component Value Date   HGBA1C 5.8 (H) 09/30/2016   MPG 120 09/30/2016   MPG 128 07/29/2014   Lab Results  Component Value Date   PROLACTIN 19.7 (H) 09/30/2016   Lab Results  Component Value Date   CHOL 136 09/30/2016   TRIG 65 09/30/2016   HDL 47 09/30/2016   CHOLHDL 2.9 09/30/2016   VLDL 13 09/30/2016   LDLCALC 76 09/30/2016   LDLCALC 91 01/21/2015    See Psychiatric Specialty Exam and Suicide Risk Assessment completed by Attending Physician prior to discharge.  Discharge destination:  Home  Is patient on multiple antipsychotic therapies at discharge:  No   Has Patient had three or more failed trials of antipsychotic monotherapy by history:  No  Recommended Plan for Multiple Antipsychotic Therapies: NA   Allergies as of 10/04/2016  Reactions   Haldol [haloperidol] Other (See Comments)   Muscle spasms, loss of voluntary movement. However, pt has taken Thorazine on multiple occasions with no adverse effects.       Medication List    STOP taking these medications   amantadine 100 MG capsule Commonly known as:  SYMMETREL   chlorproMAZINE 50 MG tablet Commonly known as:  THORAZINE     TAKE these medications     Indication  amLODipine 10 MG tablet Commonly known as:  NORVASC Take 1 tablet (10 mg total) by mouth daily. Start taking on:  10/05/2016  Indication:  High Blood Pressure Disorder   buPROPion 75 MG tablet Commonly known as:  WELLBUTRIN Take 1 tablet (75 mg  total) by mouth daily. Start taking on:  10/05/2016  Indication:  Major Depressive Disorder   hydrOXYzine 25 MG tablet Commonly known as:  ATARAX/VISTARIL Take 1 tablet (25 mg total) by mouth every 6 (six) hours as needed for anxiety.  Indication:  Anxiety Neurosis   lamoTRIgine 25 MG tablet Commonly known as:  LAMICTAL Take 1 tablet (25 mg total) by mouth daily. Start taking on:  10/05/2016  Indication:  Manic-Depression   nicotine 14 mg/24hr patch Commonly known as:  NICODERM CQ - dosed in mg/24 hours Place 1 patch (14 mg total) onto the skin daily. Start taking on:  10/05/2016  Indication:  Nicotine Addiction   OLANZapine 7.5 MG tablet Commonly known as:  ZYPREXA Take 1 tablet (7.5 mg total) by mouth at bedtime.  Indication:  mood stabilization   traZODone 50 MG tablet Commonly known as:  DESYREL Take 1 tablet (50 mg total) by mouth at bedtime.  Indication:  Trouble Sleeping      Follow-up Information    [name] Follow up.   Specialty:  Behavioral Health Why:  Go to the walk-in clinic next week between 8 and 11AM for your hospital follow up appointment Contact information: [address] [place] [address] [phone]           Follow-up recommendations:  Activity:  as tol Diet:  as tol  Comments:  1.  Take all your medications as prescribed.   2.  Report any adverse side effects to outpatient provider. 3.  Patient instructed to not use alcohol or illegal drugs while on prescription medicines. 4.  In the event of worsening symptoms, instructed patient to call 911, the crisis hotline or go to nearest emergency room for evaluation of symptoms.  Signed: Lindwood Qua, NP [facility] 10/04/2016, 3:02 PM   Patient seen, Suicide Assessment Completed.  Disposition Plan Reviewed

## 2016-10-10 ENCOUNTER — Encounter (HOSPITAL_COMMUNITY): Payer: Self-pay | Admitting: Emergency Medicine

## 2016-10-10 ENCOUNTER — Emergency Department (HOSPITAL_COMMUNITY)
Admission: EM | Admit: 2016-10-10 | Discharge: 2016-10-10 | Disposition: A | Discharge status: Home / Self Care | Payer: Medicare Other | Attending: Emergency Medicine | Admitting: Emergency Medicine

## 2016-10-10 DIAGNOSIS — F1410 Cocaine abuse, uncomplicated: Secondary | ICD-10-CM | POA: Insufficient documentation

## 2016-10-10 DIAGNOSIS — R45851 Suicidal ideations: Secondary | ICD-10-CM

## 2016-10-10 DIAGNOSIS — E1140 Type 2 diabetes mellitus with diabetic neuropathy, unspecified: Secondary | ICD-10-CM | POA: Insufficient documentation

## 2016-10-10 DIAGNOSIS — F1994 Other psychoactive substance use, unspecified with psychoactive substance-induced mood disorder: Secondary | ICD-10-CM

## 2016-10-10 DIAGNOSIS — Z79899 Other long term (current) drug therapy: Secondary | ICD-10-CM | POA: Insufficient documentation

## 2016-10-10 DIAGNOSIS — F17210 Nicotine dependence, cigarettes, uncomplicated: Secondary | ICD-10-CM | POA: Insufficient documentation

## 2016-10-10 DIAGNOSIS — I110 Hypertensive heart disease with heart failure: Secondary | ICD-10-CM | POA: Insufficient documentation

## 2016-10-10 DIAGNOSIS — F200 Paranoid schizophrenia: Secondary | ICD-10-CM | POA: Diagnosis present

## 2016-10-10 DIAGNOSIS — I509 Heart failure, unspecified: Secondary | ICD-10-CM | POA: Insufficient documentation

## 2016-10-10 DIAGNOSIS — F1910 Other psychoactive substance abuse, uncomplicated: Secondary | ICD-10-CM

## 2016-10-10 LAB — SALICYLATE LEVEL

## 2016-10-10 LAB — RAPID URINE DRUG SCREEN, HOSP PERFORMED
Amphetamines: NOT DETECTED
BARBITURATES: NOT DETECTED
Benzodiazepines: NOT DETECTED
Cocaine: POSITIVE — AB
Opiates: NOT DETECTED
TETRAHYDROCANNABINOL: NOT DETECTED

## 2016-10-10 LAB — COMPREHENSIVE METABOLIC PANEL
ALT: 18 U/L (ref 17–63)
AST: 26 U/L (ref 15–41)
Albumin: 4 g/dL (ref 3.5–5.0)
Alkaline Phosphatase: 107 U/L (ref 38–126)
Anion gap: 5 (ref 5–15)
BUN: 16 mg/dL (ref 6–20)
CO2: 30 mmol/L (ref 22–32)
CREATININE: 0.92 mg/dL (ref 0.61–1.24)
Calcium: 9.4 mg/dL (ref 8.9–10.3)
Chloride: 107 mmol/L (ref 101–111)
GFR calc non Af Amer: >60 mL/min (ref >60)
Glucose, Bld: 147 mg/dL — ABNORMAL HIGH (ref 65–99)
Potassium: 3 mmol/L — ABNORMAL LOW (ref 3.5–5.1)
SODIUM: 142 mmol/L (ref 135–145)
Total Bilirubin: 0.6 mg/dL (ref 0.3–1.2)
Total Protein: 7.6 g/dL (ref 6.5–8.1)

## 2016-10-10 LAB — CBC
HEMATOCRIT: 38 % — AB (ref 39.0–52.0)
HEMOGLOBIN: 12 g/dL — AB (ref 13.0–17.0)
MCH: 28.5 pg (ref 26.0–34.0)
MCHC: 31.6 g/dL (ref 30.0–36.0)
MCV: 90.3 fL (ref 78.0–100.0)
Platelets: 269 10*3/uL (ref 150–400)
RBC: 4.21 MIL/uL — ABNORMAL LOW (ref 4.22–5.81)
RDW: 14.2 % (ref 11.5–15.5)
WBC: 7.8 10*3/uL (ref 4.0–10.5)

## 2016-10-10 LAB — ETHANOL: Alcohol, Ethyl (B): <5 mg/dL (ref <5)

## 2016-10-10 LAB — ACETAMINOPHEN LEVEL: Acetaminophen (Tylenol), Serum: <10 ug/mL — ABNORMAL LOW (ref 10–30)

## 2016-10-10 MED ORDER — ACETAMINOPHEN 325 MG PO TABS
650.0000 mg | ORAL_TABLET | ORAL | Status: DC | PRN
  Administered 2016-10-10: 650 mg via ORAL
  Filled 2016-10-10: qty 2

## 2016-10-10 MED ORDER — ONDANSETRON HCL 4 MG PO TABS: 4.0000 mg | ORAL_TABLET | Freq: Three times a day (TID) | ORAL | Status: DC | PRN

## 2016-10-10 MED ORDER — POTASSIUM CHLORIDE CRYS ER 20 MEQ PO TBCR
40.0000 meq | EXTENDED_RELEASE_TABLET | Freq: Three times a day (TID) | ORAL | Status: DC
  Administered 2016-10-10: 40 meq via ORAL
  Filled 2016-10-10: qty 2

## 2016-10-10 MED ORDER — LORAZEPAM 1 MG PO TABS
1.0000 mg | ORAL_TABLET | Freq: Three times a day (TID) | ORAL | Status: DC | PRN
  Administered 2016-10-10: 1 mg via ORAL
  Filled 2016-10-10: qty 1

## 2016-10-10 MED ORDER — NICOTINE 21 MG/24HR TD PT24: 21.0000 mg | MEDICATED_PATCH | Freq: Every day | TRANSDERMAL | Status: DC

## 2016-10-10 MED ORDER — IBUPROFEN 200 MG PO TABS: 600.0000 mg | ORAL_TABLET | Freq: Three times a day (TID) | ORAL | Status: DC | PRN

## 2016-10-10 MED ORDER — ALUM & MAG HYDROXIDE-SIMETH 200-200-20 MG/5ML PO SUSP: 30.0000 mL | ORAL | Status: DC | PRN

## 2016-10-10 NOTE — ED Notes (Signed)
When tech was taking vitals pt asked for juice.  When I suggested water first just to make sure he wasn't nauseous he became very angry shouting "I don't want water I want juice!  That's not what I asked for!  Just forget it!"  Then he told the tech "keep her away from me" pointing at me.  I informed the pt that the provider would be in soon to speak with him.  Pt A&Ox4.

## 2016-10-10 NOTE — ED Notes (Signed)
Gave pt. Orange juice and milk.

## 2016-10-10 NOTE — [facility] Suicide Risk Assessment (Signed)
[facility] Discharge Suicide Risk Assessment   Principal Problem: Substance induced mood disorder [facility]) Discharge Diagnoses:  Patient Active Problem List   Diagnosis Date Noted  . Substance induced mood disorder (HCC) [F19.94] 03/13/2015    Priority: High  . Chronic paranoid schizophrenia (HCC) [F20.0] 09/07/2014    Priority: High  . Substance or medication-induced bipolar and related disorder with onset during intoxication [facility]) [F19.94] 08/10/2014    Priority: High  . Cocaine use disorder, severe, dependence (HCC) [F14.20]     Priority: High  . Schizoaffective disorder, bipolar type (HCC) [F25.0] 09/30/2016  . Suicidal ideations [R45.851]   . Acute kidney failure (HCC) [N17.9] 01/26/2015  . Schizophrenia, paranoid type (HCC) [F20.0] 01/17/2015  . Suicidal ideation [R45.851]   . 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 Acute CHF (congestive heart failure) (HCC) [I50.9] 07/29/2014  . Essential hypertension, benign [I10] 03/28/2013  . Diabetes mellitus (HCC) [E11.9] 03/15/2013    Total Time spent with patient: 15 minutes  Musculoskeletal: Strength & Muscle Tone: within normal limits Gait & Station: normal Patient leans: N/A  Psychiatric Specialty Exam:   Blood pressure (!) 143/83, pulse 93, temperature 98.1 F (36.7 C), resp. rate 18, SpO2 98 %.There is no height or weight on file to calculate BMI.  General Appearance: Casual and Fairly Groomed  Eye Contact::  Good  Speech:  Clear and Coherent and Normal Rate409  Volume:  Normal  Mood:  Angry and Anxious  Affect:  Appropriate and Congruent  Thought Process:  Coherent, Goal Directed, Linear and Descriptions of Associations: Intact  Orientation:  Full (Time, Place, and Person)  Thought Content:  Focused on wanting to call probation officer and have FL2 form  Suicidal Thoughts:  No  Homicidal Thoughts:  No  Memory:  Immediate;   Fair Recent;   Fair Remote;   Fair  Judgement:  Fair  Insight:  Fair  Psychomotor  Activity:  Normal  Concentration:  Fair  Recall:  [employer] of Knowledge:Fair  Language: Fair  Akathisia:  No  Handed:    AIMS (if indicated):     Assets:  Communication Skills Desire for Improvement Resilience Social Support  Sleep:     Cognition: WNL  ADL's:  Intact   Mental Status Per Nursing Assessment::   On Admission:     Demographic Factors:  Male, Low socioeconomic status and Unemployed  Loss Factors: Legal issues and Financial problems/change in socioeconomic status  Historical Factors: Impulsivity  Risk Reduction Factors:   Positive therapeutic relationship  Continued Clinical Symptoms:  Schizophrenia:   Paranoid or undifferentiated type  Cognitive Features That Contribute To Risk:  Closed-mindedness    Suicide Risk:  Minimal: No identifiable suicidal ideation.  Patients presenting with no risk factors but with morbid ruminations; may be classified as minimal risk based on the severity of the depressive symptoms  Plan Of Care/Follow-up recommendations:  Activity:  As tolerated Diet:  Heart healthy with low sodium.  Beau Fanny, FNP 10/10/2016, 11:24 AM

## 2016-10-10 NOTE — Discharge Instructions (Signed)
For your ongoing behavioral health needs, you are advised to follow up with [facility].  Contact them at your earliest opportunity to schedule an intake appointment:       [facility]      [address] [place], [place] [address]      [phone]
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
## 2016-10-10 NOTE — Progress Notes (Signed)
CSW provided patient with shelter resources and bus pass. Patient also requested information regarding obtaining an FL2 to get into a group home. CSW informed patient he could get FL2 at the [facility] or PCP.   Stacy Gardner, LCSWA Clinical Social Worker [phone]

## 2016-10-10 NOTE — ED Notes (Signed)
Patient calming down after breakfast.  Took a shower and is resting comfortably after his shower.

## 2016-10-10 NOTE — ED Notes (Signed)
Pt waiting to be seen by MD or PA.  Will continue to monitor and provide comfort measures.  Pt sleeping. 

## 2016-10-10 NOTE — ED Notes (Signed)
Pt waiting to be seen by MD or PA.  Will continue to monitor and provide comfort measures. 

## 2016-10-10 NOTE — ED Triage Notes (Signed)
Pt via EMS from [facility].  Reports SI and points to forehead repeatedly, babbles to himself and odd behavior.  States "I really want to get help this time, I don't want to go back to prison, they hate me there".  Denies pain.  Denies HI and AVH.  Pt has care plan and appears to be homeless. 148/92, 82 bpm, 16 RR, 99% RA

## 2016-10-10 NOTE — BH Assessment (Signed)
[facility] Assessment Progress Note  Per Thedore Mins, MD, this pt does not require psychiatric hospitalization at this time.  Pt is to be discharged from [facility] with referral information for [facility].  This has been included in pt's discharge instructions.  Pt's nurse, Lincoln Maxin, has been notified.  Doylene Canning, MA Triage Specialist [phone]

## 2016-10-10 NOTE — ED Notes (Signed)
ED Provider at bedside. 

## 2016-10-10 NOTE — ED Provider Notes (Signed)
[facility]-EMERGENCY DEPT Provider Note   CSN: [id number] Arrival date & time: 10/10/16  0007     History   Chief Complaint Chief Complaint  Patient presents with  . Suicidal    HPI [name] is a 56 y.o. male.  Patient with history of substance abuse, schizophrenia, homelessness -- presents to the emergency department complaining of worsening suicidal ideation and paranoia. Patient states people are trying to kill him. He states that he is going to kill somebody else and then kill himself. He states that he needs to go to a long-term care facility or to [facility]. Denies any current medical complaints except for left foot pain. The onset of this condition was acute on chronic. The course is constant. Aggravating factors: none. Alleviating factors: none.        Past Medical History:  Diagnosis Date  . CHF (congestive heart failure) (HCC)   . Chronic foot pain   . Cocaine abuse   . Diabetes mellitus without complication (HCC)   . Hepatitis C   . Homelessness   . Homelessness   . Hypertension   . Neuropathy (HCC)   . Polysubstance abuse   . Schizophrenia [facility])     Patient Active Problem List   Diagnosis Date Noted  . Schizoaffective disorder, bipolar type (HCC) 09/30/2016  . Suicidal ideations   . Substance induced mood disorder (HCC) 03/13/2015  . Acute kidney failure (HCC) 01/26/2015  . Schizophrenia, paranoid type (HCC) 01/17/2015  . Suicidal ideation   . Drug hallucinosis (HCC) 10/08/2014  . Chronic paranoid schizophrenia (HCC) 09/07/2014  . 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 Acute CHF (congestive heart failure) (HCC) 07/29/2014  . Cocaine use disorder, severe, dependence (HCC)   . Essential hypertension, benign 03/28/2013  . Diabetes mellitus (HCC) 03/15/2013    History reviewed. No pertinent surgical history.     Home Medications    Prior to Admission  medications   Medication Sig Start Date End Date Taking? Authorizing Provider  amLODipine (NORVASC) 10 MG tablet Take 1 tablet (10 mg total) by mouth daily. 10/05/16  Yes Adonis Brook, NP  buPROPion (WELLBUTRIN) 75 MG tablet Take 1 tablet (75 mg total) by mouth daily. 10/05/16  Yes Adonis Brook, NP  hydrOXYzine (ATARAX/VISTARIL) 25 MG tablet Take 1 tablet (25 mg total) by mouth every 6 (six) hours as needed for anxiety. 10/04/16  Yes Adonis Brook, NP  lamoTRIgine (LAMICTAL) 25 MG tablet Take 1 tablet (25 mg total) by mouth daily. 10/05/16  Yes Adonis Brook, NP  nicotine (NICODERM CQ - DOSED IN MG/24 HOURS) 14 mg/24hr patch Place 1 patch (14 mg total) onto the skin daily. 10/05/16  Yes Adonis Brook, NP  OLANZapine (ZYPREXA) 7.5 MG tablet Take 1 tablet (7.5 mg total) by mouth at bedtime. 10/04/16  Yes Adonis Brook, NP  traZODone (DESYREL) 50 MG tablet Take 1 tablet (50 mg total) by mouth at bedtime. 10/04/16  Yes Adonis Brook, NP    Family History Family History  Problem Relation Age of Onset  . Hypertension Other   . Diabetes Other     Social History Social History  Substance Use Topics  . Smoking status: Current Every Day Smoker    Packs/day: 1.00    Years: 20.00    Types: Cigarettes  . Smokeless tobacco: Current User  . Alcohol use Yes     Comment: Daily Drinker      Allergies   Haldol [haloperidol]  Review of Systems Review of Systems  Constitutional: Negative for fever.  HENT: Negative for rhinorrhea and sore throat.   Eyes: Negative for redness.  Respiratory: Negative for cough.   Cardiovascular: Negative for chest pain.  Gastrointestinal: Negative for abdominal pain, diarrhea, nausea and vomiting.  Genitourinary: Negative for dysuria.  Musculoskeletal: Negative for myalgias.  Skin: Negative for rash.  Neurological: Negative for headaches.  Psychiatric/Behavioral: Positive for agitation and suicidal ideas. The patient is nervous/anxious.      Physical  Exam Updated Vital Signs BP (!) 135/93 (BP Location: Left Arm)   Pulse 99   Temp 98.1 F (36.7 C)   Resp 17   SpO2 99%   Physical Exam  Constitutional: He appears well-developed and well-nourished.  HENT:  Head: Normocephalic and atraumatic.  Eyes: Conjunctivae are normal. Right eye exhibits no discharge. Left eye exhibits no discharge.  Neck: Normal range of motion. Neck supple.  Cardiovascular: Normal rate, regular rhythm and normal heart sounds.   Pulmonary/Chest: Effort normal and breath sounds normal.  Abdominal: Soft. There is no tenderness.  Musculoskeletal: Normal range of motion.  Patient with small ulceration on left medial great toe. No associated infection. Patient has fungal infection of that toenail.  Neurological: He is alert.  Skin: Skin is warm and dry.  Psychiatric: His affect is angry. His speech is rapid and/or pressured. He is agitated and aggressive. He expresses homicidal and suicidal ideation.  Nursing note and vitals reviewed.    ED Treatments / Results  Labs (all labs ordered are listed, but only abnormal results are displayed) Labs Reviewed  COMPREHENSIVE METABOLIC PANEL - Abnormal; Notable for the following:       Result Value   Potassium 3.0 (*)    Glucose, Bld 147 (*)    All other components within normal limits  ACETAMINOPHEN LEVEL - Abnormal; Notable for the following:    Acetaminophen (Tylenol), Serum <10 (*)    All other components within normal limits  CBC - Abnormal; Notable for the following:    RBC 4.21 (*)    Hemoglobin 12.0 (*)    HCT 38.0 (*)    All other components within normal limits  RAPID URINE DRUG SCREEN, HOSP PERFORMED - Abnormal; Notable for the following:    Cocaine POSITIVE (*)    All other components within normal limits  ETHANOL  SALICYLATE LEVEL    Procedures Procedures (including critical care time)  Medications Ordered in ED Medications  LORazepam (ATIVAN) tablet 1 mg (not administered)  ibuprofen  (ADVIL,MOTRIN) tablet 600 mg (not administered)  acetaminophen (TYLENOL) tablet 650 mg (not administered)  nicotine (NICODERM CQ - dosed in mg/24 hours) patch 21 mg (not administered)  ondansetron (ZOFRAN) tablet 4 mg (not administered)  alum & mag hydroxide-simeth (MAALOX/MYLANTA) 200-200-20 MG/5ML suspension 30 mL (not administered)     Initial Impression / Assessment and Plan / ED Course  I have reviewed the triage vital signs and the nursing notes.  Pertinent labs & imaging results that were available during my care of the patient were reviewed by me and considered in my medical decision making (see chart for details).     Patient seen and examined. Potassium ordered for low potassium. Otherwise patient is medically cleared. Will ask for TTS eval. Patient was recently at behavioral health.   Vital signs reviewed and are as follows: BP (!) 135/93 (BP Location: Left Arm)   Pulse 99   Temp 98.1 F (36.7 C)   Resp 17   SpO2 99%  Final Clinical Impressions(s) / ED Diagnoses   Final diagnoses:  Suicidal ideation  Polysubstance abuse   Pending eval. Pt is cleared medically.   New Prescriptions New Prescriptions   No medications on file     Renne Crigler, PA-C 10/10/16 0548    Paula Libra, MD 10/10/16 [phone]

## 2016-10-10 NOTE — ED Notes (Signed)
Report called to [facility] in [place].

## 2016-10-14 ENCOUNTER — Emergency Department (HOSPITAL_COMMUNITY)
Admission: EM | Admit: 2016-10-14 | Discharge: 2016-10-16 | Disposition: A | Discharge status: Home / Self Care | Payer: Medicare Other | Attending: Emergency Medicine | Admitting: Emergency Medicine

## 2016-10-14 ENCOUNTER — Encounter (HOSPITAL_COMMUNITY): Payer: Self-pay | Admitting: Emergency Medicine

## 2016-10-14 DIAGNOSIS — M79673 Pain in unspecified foot: Secondary | ICD-10-CM | POA: Insufficient documentation

## 2016-10-14 DIAGNOSIS — F209 Schizophrenia, unspecified: Secondary | ICD-10-CM | POA: Insufficient documentation

## 2016-10-14 DIAGNOSIS — I110 Hypertensive heart disease with heart failure: Secondary | ICD-10-CM | POA: Insufficient documentation

## 2016-10-14 DIAGNOSIS — E119 Type 2 diabetes mellitus without complications: Secondary | ICD-10-CM | POA: Insufficient documentation

## 2016-10-14 DIAGNOSIS — F1420 Cocaine dependence, uncomplicated: Secondary | ICD-10-CM | POA: Diagnosis present

## 2016-10-14 DIAGNOSIS — Z79899 Other long term (current) drug therapy: Secondary | ICD-10-CM | POA: Insufficient documentation

## 2016-10-14 DIAGNOSIS — F250 Schizoaffective disorder, bipolar type: Secondary | ICD-10-CM | POA: Diagnosis present

## 2016-10-14 DIAGNOSIS — I509 Heart failure, unspecified: Secondary | ICD-10-CM | POA: Insufficient documentation

## 2016-10-14 DIAGNOSIS — R45851 Suicidal ideations: Secondary | ICD-10-CM | POA: Insufficient documentation

## 2016-10-14 DIAGNOSIS — F17210 Nicotine dependence, cigarettes, uncomplicated: Secondary | ICD-10-CM | POA: Insufficient documentation

## 2016-10-14 DIAGNOSIS — F1994 Other psychoactive substance use, unspecified with psychoactive substance-induced mood disorder: Secondary | ICD-10-CM | POA: Diagnosis present

## 2016-10-14 DIAGNOSIS — G8929 Other chronic pain: Secondary | ICD-10-CM | POA: Insufficient documentation

## 2016-10-14 NOTE — ED Triage Notes (Signed)
Staffing notified of need for SI sitter. No sitter available at this time. ED tech sitting with pt at this time

## 2016-10-14 NOTE — ED Triage Notes (Signed)
Pt states SI x1 week also bilat foot swelling/ pain x1 week. Pt states he has a plan to hurt himself by jumping from moving car.

## 2016-10-15 DIAGNOSIS — Z79899 Other long term (current) drug therapy: Secondary | ICD-10-CM

## 2016-10-15 LAB — COMPREHENSIVE METABOLIC PANEL
ALBUMIN: 3.6 g/dL (ref 3.5–5.0)
ALT: 13 U/L — AB (ref 17–63)
AST: 19 U/L (ref 15–41)
Alkaline Phosphatase: 99 U/L (ref 38–126)
Anion gap: 6 (ref 5–15)
BUN: 13 mg/dL (ref 6–20)
CHLORIDE: 107 mmol/L (ref 101–111)
CO2: 30 mmol/L (ref 22–32)
CREATININE: 0.84 mg/dL (ref 0.61–1.24)
Calcium: 9.6 mg/dL (ref 8.9–10.3)
GFR calc Af Amer: >60 mL/min (ref >60)
GFR calc non Af Amer: >60 mL/min (ref >60)
GLUCOSE: 192 mg/dL — AB (ref 65–99)
Potassium: 3.5 mmol/L (ref 3.5–5.1)
SODIUM: 143 mmol/L (ref 135–145)
Total Bilirubin: 0.6 mg/dL (ref 0.3–1.2)
Total Protein: 6.6 g/dL (ref 6.5–8.1)

## 2016-10-15 LAB — CBC
HEMATOCRIT: 37.7 % — AB (ref 39.0–52.0)
HEMOGLOBIN: 11.7 g/dL — AB (ref 13.0–17.0)
MCH: 28.3 pg (ref 26.0–34.0)
MCHC: 31 g/dL (ref 30.0–36.0)
MCV: 91.1 fL (ref 78.0–100.0)
Platelets: 263 10*3/uL (ref 150–400)
RBC: 4.14 MIL/uL — ABNORMAL LOW (ref 4.22–5.81)
RDW: 14.7 % (ref 11.5–15.5)
WBC: 6.6 10*3/uL (ref 4.0–10.5)

## 2016-10-15 LAB — RAPID URINE DRUG SCREEN, HOSP PERFORMED
Amphetamines: NOT DETECTED
BARBITURATES: NOT DETECTED
Benzodiazepines: NOT DETECTED
Cocaine: POSITIVE — AB
Opiates: NOT DETECTED
TETRAHYDROCANNABINOL: NOT DETECTED

## 2016-10-15 LAB — CBG MONITORING, ED
GLUCOSE-CAPILLARY: 140 mg/dL — AB (ref 65–99)
Glucose-Capillary: 147 mg/dL — ABNORMAL HIGH (ref 65–99)

## 2016-10-15 LAB — SALICYLATE LEVEL: Salicylate Lvl: <7 mg/dL (ref 2.8–30.0)

## 2016-10-15 LAB — ETHANOL: Alcohol, Ethyl (B): <5 mg/dL (ref <5)

## 2016-10-15 LAB — ACETAMINOPHEN LEVEL

## 2016-10-15 MED ORDER — HYDROXYZINE HCL 25 MG PO TABS: 25.0000 mg | ORAL_TABLET | Freq: Four times a day (QID) | ORAL | Status: DC | PRN

## 2016-10-15 MED ORDER — ONDANSETRON HCL 4 MG PO TABS: 4.0000 mg | ORAL_TABLET | Freq: Three times a day (TID) | ORAL | Status: DC | PRN

## 2016-10-15 MED ORDER — OLANZAPINE 5 MG PO TABS
7.5000 mg | ORAL_TABLET | Freq: Every day | ORAL | Status: DC
  Administered 2016-10-15 (×2): 7.5 mg via ORAL
  Filled 2016-10-15: qty 2
  Filled 2016-10-15: qty 1
  Filled 2016-10-15: qty 2

## 2016-10-15 MED ORDER — ZOLPIDEM TARTRATE 5 MG PO TABS: 5.0000 mg | ORAL_TABLET | Freq: Every evening | ORAL | Status: DC | PRN

## 2016-10-15 MED ORDER — ACETAMINOPHEN 325 MG PO TABS
650.0000 mg | ORAL_TABLET | Freq: Once | ORAL | Status: AC
  Administered 2016-10-15: 650 mg via ORAL
  Filled 2016-10-15: qty 2

## 2016-10-15 MED ORDER — BUPROPION HCL 75 MG PO TABS
75.0000 mg | ORAL_TABLET | Freq: Every day | ORAL | Status: DC
  Filled 2016-10-15 (×2): qty 1

## 2016-10-15 MED ORDER — ACETAMINOPHEN 325 MG PO TABS
650.0000 mg | ORAL_TABLET | ORAL | Status: DC | PRN
  Administered 2016-10-15 – 2016-10-16 (×2): 650 mg via ORAL
  Filled 2016-10-15 (×2): qty 2

## 2016-10-15 MED ORDER — NICOTINE 21 MG/24HR TD PT24
21.0000 mg | MEDICATED_PATCH | Freq: Every day | TRANSDERMAL | Status: DC
  Administered 2016-10-15 – 2016-10-16 (×2): 21 mg via TRANSDERMAL
  Filled 2016-10-15 (×2): qty 1

## 2016-10-15 MED ORDER — AMLODIPINE BESYLATE 5 MG PO TABS
10.0000 mg | ORAL_TABLET | Freq: Every day | ORAL | Status: DC
  Administered 2016-10-15 – 2016-10-16 (×2): 10 mg via ORAL
  Filled 2016-10-15 (×2): qty 2

## 2016-10-15 MED ORDER — ALUM & MAG HYDROXIDE-SIMETH 200-200-20 MG/5ML PO SUSP: 30.0000 mL | ORAL | Status: DC | PRN

## 2016-10-15 MED ORDER — LAMOTRIGINE 25 MG PO TABS
25.0000 mg | ORAL_TABLET | Freq: Every day | ORAL | Status: DC
  Administered 2016-10-16: 25 mg via ORAL
  Filled 2016-10-15: qty 1

## 2016-10-15 MED ORDER — TRAZODONE HCL 50 MG PO TABS
50.0000 mg | ORAL_TABLET | Freq: Every day | ORAL | Status: DC
  Administered 2016-10-15 (×2): 50 mg via ORAL
  Filled 2016-10-15 (×2): qty 1

## 2016-10-15 MED ORDER — IBUPROFEN 400 MG PO TABS: 600.0000 mg | ORAL_TABLET | Freq: Three times a day (TID) | ORAL | Status: DC | PRN

## 2016-10-15 MED ORDER — LORAZEPAM 1 MG PO TABS
1.0000 mg | ORAL_TABLET | Freq: Three times a day (TID) | ORAL | Status: DC | PRN
  Administered 2016-10-15: 1 mg via ORAL
  Filled 2016-10-15 (×2): qty 1

## 2016-10-15 NOTE — ED Notes (Signed)
Currently talking with TTS.

## 2016-10-15 NOTE — Progress Notes (Signed)
Pt meets criteria for inpt treatment. CSW faxed referrals to the following inpt facilities for review:  [facility]  [facility]  TTS will continue to seek bed placement.  Princess Bruins, LCSWA Clinical Social Work [phone]

## 2016-10-15 NOTE — Consult Note (Signed)
[facility] TelePsychiatry Consult   Reason for Consult:  Psychiatric evaluation  Referring Physician:  EDP Patient Identification: [name] MRN:  [id number] Principal Diagnosis: Substance induced mood disorder [facility]) Diagnosis:   Patient Active Problem List   Diagnosis Date Noted  . Schizoaffective disorder, bipolar type ([place]) [F25.0] 09/30/2016    Priority: High  . Substance induced mood disorder ([place]) [F19.94] 03/13/2015    Priority: High  . Chronic paranoid schizophrenia ([place]) [F20.0] 09/07/2014    Priority: High  . Substance or medication-induced bipolar and related disorder with onset during intoxication [facility]) [F19.94] 08/10/2014    Priority: High  . Cocaine use disorder, severe, dependence ([place]) [F14.20]     Priority: High  . Suicidal ideations [R45.851]   . Acute kidney failure ([place]) [N17.9] 01/26/2015  . Schizophrenia, paranoid type ([place]) [F20.0] 01/17/2015  . Suicidal ideation [R45.851]   . Drug hallucinosis ([place]) [F19.951] 10/08/2014  . Acute CHF (congestive heart failure) ([place]) [I50.9] 07/29/2014  . Essential hypertension, benign [I10] 03/28/2013  . Diabetes mellitus ([place]) [E11.9] 03/15/2013    Total Time spent with patient: 35 minutes  Subjective:   [name] is a 56 y.o. male patient who states "I really need some help. I haven't been taking my meds". Pt seen and chart reviewed. Pt is alert/oriented x3, yet very irritable, agitated, tangential, and cannot focus very well on the assessment. He denies suicidal/homicidal ideation yet was unable to deny psychosis and is so tangential (with intermittent falling asleep during exam) that I cannot truly determine if he is responding to internal stimuli at this time. Based on his history and current presentation, he would benefit from inpatient treatment that would allow him to continue the medications we started him on recently.   HPI:  I have reviewed and concur with HPI elements below, modified as  follows: "[name] is an 56 y.o. male. Pt with history of Schizophrenia and cocaine use. Pt sts he is having SI with a plan to walk into traffic to kill himself. Patient requesting FL-2 because he wants placement in a group home. Patient has either been homeless or incarcerated over the course of 15 yrs. He was recently released from jail (09/24/2016) after serving 15 months for drug related charges. He has since remained homeless and sts it is rough living on the streets. He rambles about witchcraft making him go in/out of jail and keeping him from getting a girlfriend. He reports prior suicide attempts but would not provide details. He denies HI. No history of violent or aggressive behaviors.Pt sts he has pending charges for Trespassing with no court date set and sts he is on probation from being released on 09/24/16. Denies HI, SHI and AVH. He received outpatient [facility] health services at [facility] but sts he has no [place] provider now. He reports multiple INPT hospitalizations at various facilities on multiple dates for schizoaffective D/O. He reported cocaine use regularly. Pt reports that he has been smoking Cigarettes. He has a 20.00 pack-year smoking history. He uses smokeless tobacco. He reported on 10/10/16 that he drinks alcohol and he reported that he uses drugs, including "Crack" cocaine, Cocaine, and Marijuana, about 7 times per week. Tonight he denies any drug/alcohol use since being released on probation from [facility] on 09/24/16. Pt tested positive for Cocaine only tonight when tested in the [facility] tonight. "  Interval History 10/15/16: Pt seen for psych evaluation as above. Pt has been cooperative with staff with intermittent statements about suicidal ideation with plan to  walk in front of traffic. Pt is extremely tangential and cannot focus very well on the assessment. See evaluation above.   Past Psychiatric History: Paranoid schizophrenia, Cocaine use disorder  Risk to Self: Suicidal  Ideation: Yes-Currently Present Suicidal Intent: Yes-Currently Present Is patient at risk for suicide?: Yes Suicidal Plan?: Yes-Currently Present Specify Current Suicidal Plan:  (PLAN TO WALK INTO TRAFFIC TO BE HIT & KILLED) Access to Means: Yes (STS NO ACCESS TO GUNS) Specify Access to Suicidal Means:  (TRAFFIC) What has been your use of drugs/alcohol within the last 12 months?:  (REGULAR USE) How many times?:  (0) Other Self Harm Risks:  (NONE REPORTED) Triggers for Past Attempts: None known Intentional Self Injurious Behavior: None Risk to Others: Homicidal Ideation: No (DENIES) Thoughts of Harm to Others: No (DENIES) Current Homicidal Intent: No Current Homicidal Plan: No Access to Homicidal Means: No Identified Victim:  (NONE) History of harm to others?: No Assessment of Violence: None Noted Violent Behavior Description:  (NA) Does patient have access to weapons?: No Criminal Charges Pending?: Yes Describe Pending Criminal Charges:  (STS HAS CURRENT [facility]GS FOR TRESPASSING) Does patient have a court date: No Prior Inpatient Therapy: Prior Inpatient Therapy: Yes Prior Therapy Dates:  (MULTIPLE PER PT) Prior Therapy Facilty/Provider(s):  ([facility] [facility], MULTIPLE OTHERS) Reason for Treatment:  (SCHIZOAFFECTIVE D/O) Prior Outpatient Therapy: Prior Outpatient Therapy: Yes Prior Therapy Dates:  (2016) Prior Therapy Facilty/Provider(s):  [facility]) Reason for Treatment:  (SCHIZOAFFECTIVE ) Does patient have an ACCT team?: No Does patient have Intensive In-House Services?  : No Does patient have [name] services? : No Does patient have P4CC services?: No  Past Medical History:  Past Medical History:  Diagnosis Date  . CHF (congestive heart failure) ([place])   . Chronic foot pain   . Cocaine abuse   . Diabetes mellitus without complication ([place])   . Hepatitis C   . Homelessness   . Homelessness   . Hypertension   . Neuropathy   . Polysubstance abuse   . Schizophrenia ([place])    History  reviewed. No pertinent surgical history. Family History:  Family History  Problem Relation Age of Onset  . Hypertension Other   . Diabetes Other    Family Psychiatric  History: Unknown Social History:  History  Alcohol Use  . Yes    Comment: Daily Drinker      History  Drug Use  . Frequency: 7.0 times per week  . Types: "Crack" cocaine, Cocaine, Marijuana    Comment: Cocaine tonight, Marijuana "a long time"    Social History   Social History  . Marital status: Divorced    Spouse name: N/A  . Number of children: N/A  . Years of education: N/A   Social History Main Topics  . Smoking status: Current Every Day Smoker    Packs/day: 1.00    Years: 20.00    Types: Cigarettes  . Smokeless tobacco: Current User  . Alcohol use Yes     Comment: Daily Drinker   . Drug use: Yes    Frequency: 7.0 times per week    Types: "Crack" cocaine, Cocaine, Marijuana     Comment: Cocaine tonight, Marijuana "a long time"  . Sexual activity: No   Other Topics Concern  . None   Social History Narrative   ** Merged History Encounter **       Additional Social History:    History of alcohol / drug use?: Yes Longest period of sobriety (when/how long): UNKNOWN Name of Substance 1:  CRACK COCAINE, COCAINE 1 - Age of First Use: 15 1 - Amount (size/oz): VARIES 1 - Frequency: DAILY PER REPORT ON 10/10/16- DENIES ANY USE TONIGHT-TESTED POSITIVE 1 - Duration: ONGOING 1 - Last Use / Amount: RECENTLY Name of Substance 2: ALCOHOL 2 - Age of First Use: 15 2 - Amount (size/oz): "A FEW CANS OF BEER" 2 - Frequency: UNKNOWN 2 - Duration: ONGOING 2 - Last Use / Amount: UNKNOWN- TESTED NEGATIVE TONIGHT Name of Substance 3: NICOTINE/CIGARETTES 3 - Age of First Use: 15 3 - Amount (size/oz): ABOUT 1/2 PER WEEK PER PT RECORD 3 - Frequency: DAILY 3 - Duration: ONGOING 3 - Last Use / Amount: 10/15/16          Allergies:   Allergies  Allergen Reactions  . Haldol [Haloperidol] Other (See Comments)     Muscle spasms, loss of voluntary movement. However, pt has taken Thorazine on multiple occasions with no adverse effects.     Labs:  Results for orders placed or performed during the hospital encounter of 10/14/16 (from the past 48 hour(s))  Comprehensive metabolic panel     Status: Abnormal   Collection Time: 10/14/16 10:07 PM  Result Value Ref Range   Sodium 143 135 - 145 mmol/L   Potassium 3.5 3.5 - 5.1 mmol/L   Chloride 107 101 - 111 mmol/L   CO2 30 22 - 32 mmol/L   Glucose, Bld 192 (H) 65 - 99 mg/dL   BUN 13 6 - 20 mg/dL   Creatinine, Ser 0.84 0.61 - 1.24 mg/dL   Calcium 9.6 8.9 - 10.3 mg/dL   Total Protein 6.6 6.5 - 8.1 g/dL   Albumin 3.6 3.5 - 5.0 g/dL   AST 19 15 - 41 U/L   ALT 13 (L) 17 - 63 U/L   Alkaline Phosphatase 99 38 - 126 U/L   Total Bilirubin 0.6 0.3 - 1.2 mg/dL   GFR calc non Af Amer >60 >60 mL/min   GFR calc Af Amer >60 >60 mL/min    Comment: (NOTE) The eGFR has been calculated using the CKD EPI equation. This calculation has not been validated in all clinical situations. eGFR's persistently <60 mL/min signify possible Chronic Kidney Disease.    Anion gap 6 5 - 15  Ethanol     Status: None   Collection Time: 10/14/16 10:07 PM  Result Value Ref Range   Alcohol, Ethyl (B) <5 <5 mg/dL    Comment:        LOWEST DETECTABLE LIMIT FOR SERUM ALCOHOL IS 5 mg/dL FOR MEDICAL PURPOSES ONLY   Salicylate level     Status: None   Collection Time: 10/14/16 10:07 PM  Result Value Ref Range   Salicylate Lvl <5.4 2.8 - 30.0 mg/dL  Acetaminophen level     Status: Abnormal   Collection Time: 10/14/16 10:07 PM  Result Value Ref Range   Acetaminophen (Tylenol), Serum <10 (L) 10 - 30 ug/mL    Comment:        THERAPEUTIC CONCENTRATIONS VARY SIGNIFICANTLY. A RANGE OF 10-30 ug/mL MAY BE AN EFFECTIVE CONCENTRATION FOR MANY PATIENTS. HOWEVER, SOME ARE BEST TREATED AT CONCENTRATIONS OUTSIDE THIS RANGE. ACETAMINOPHEN CONCENTRATIONS >150 ug/mL AT 4 HOURS  AFTER INGESTION AND >50 ug/mL AT 12 HOURS AFTER INGESTION ARE OFTEN ASSOCIATED WITH TOXIC REACTIONS.   cbc     Status: Abnormal   Collection Time: 10/14/16 10:07 PM  Result Value Ref Range   WBC 6.6 4.0 - 10.5 K/uL   RBC 4.14 (L) 4.22 -  5.81 MIL/uL   Hemoglobin 11.7 (L) 13.0 - 17.0 g/dL   HCT 37.7 (L) 39.0 - 52.0 %   MCV 91.1 78.0 - 100.0 fL   MCH 28.3 26.0 - 34.0 pg   MCHC 31.0 30.0 - 36.0 g/dL   RDW 14.7 11.5 - 15.5 %   Platelets 263 150 - 400 K/uL  Rapid urine drug screen (hospital performed)     Status: Abnormal   Collection Time: 10/14/16 10:07 PM  Result Value Ref Range   Opiates NONE DETECTED NONE DETECTED   Cocaine POSITIVE (A) NONE DETECTED   Benzodiazepines NONE DETECTED NONE DETECTED   Amphetamines NONE DETECTED NONE DETECTED   Tetrahydrocannabinol NONE DETECTED NONE DETECTED   Barbiturates NONE DETECTED NONE DETECTED    Comment:        DRUG SCREEN FOR MEDICAL PURPOSES ONLY.  IF CONFIRMATION IS NEEDED FOR ANY PURPOSE, NOTIFY LAB WITHIN 5 DAYS.        LOWEST DETECTABLE LIMITS FOR URINE DRUG SCREEN Drug Class       Cutoff (ng/mL) Amphetamine      1000 Barbiturate      200 Benzodiazepine   798 Tricyclics       921 Opiates          300 Cocaine          300 THC              50     Current Facility-Administered Medications  Medication Dose Route Frequency Provider Last Rate Last Dose  . acetaminophen (TYLENOL) tablet 650 mg  650 mg Oral Q4H PRN Larene Pickett, PA-C      . alum & mag hydroxide-simeth (MAALOX/MYLANTA) 200-200-20 MG/5ML suspension 30 mL  30 mL Oral PRN Larene Pickett, PA-C      . amLODipine (NORVASC) tablet 10 mg  10 mg Oral Daily Larene Pickett, PA-C   10 mg at 10/15/16 1003  . buPROPion [facility]) tablet 75 mg  75 mg Oral Q breakfast Larene Pickett, PA-C      . hydrOXYzine (ATARAX/VISTARIL) tablet 25 mg  25 mg Oral Q6H PRN Larene Pickett, PA-C      . ibuprofen (ADVIL,MOTRIN) tablet 600 mg  600 mg Oral Q8H PRN Larene Pickett, PA-C      .  lamoTRIgine (LAMICTAL) tablet 25 mg  25 mg Oral Daily Larene Pickett, PA-C      . LORazepam (ATIVAN) tablet 1 mg  1 mg Oral Q8H PRN Larene Pickett, PA-C   1 mg at 10/15/16 0154  . nicotine (NICODERM CQ - dosed in mg/24 hours) patch 21 mg  21 mg Transdermal Daily Larene Pickett, PA-C   21 mg at 10/15/16 1003  . OLANZapine (ZYPREXA) tablet 7.5 mg  7.5 mg Oral QHS Larene Pickett, PA-C   7.5 mg at 10/15/16 0154  . ondansetron (ZOFRAN) tablet 4 mg  4 mg Oral Q8H PRN Larene Pickett, PA-C      . traZODone (DESYREL) tablet 50 mg  50 mg Oral QHS Larene Pickett, PA-C   50 mg at 10/15/16 0154  . zolpidem (AMBIEN) tablet 5 mg  5 mg Oral QHS PRN Larene Pickett, PA-C       Current Outpatient Prescriptions  Medication Sig Dispense Refill  . amLODipine (NORVASC) 10 MG tablet Take 1 tablet (10 mg total) by mouth daily. 30 tablet 0  . buPROPion (WELLBUTRIN) 75 MG tablet Take 1 tablet (75 mg total) by mouth daily.  30 tablet 0  . hydrOXYzine (ATARAX/VISTARIL) 25 MG tablet Take 1 tablet (25 mg total) by mouth every 6 (six) hours as needed for anxiety. 30 tablet 0  . lamoTRIgine (LAMICTAL) 25 MG tablet Take 1 tablet (25 mg total) by mouth daily. 30 tablet 0  . nicotine (NICODERM CQ - DOSED IN MG/24 HOURS) 14 mg/24hr patch Place 1 patch (14 mg total) onto the skin daily. 28 patch 0  . OLANZapine (ZYPREXA) 7.5 MG tablet Take 1 tablet (7.5 mg total) by mouth at bedtime. 30 tablet 0  . traZODone (DESYREL) 50 MG tablet Take 1 tablet (50 mg total) by mouth at bedtime. 30 tablet 0    Musculoskeletal: UTO, camera  Psychiatric Specialty Exam: Review of Systems  Constitutional: Negative.   HENT: Negative.   Eyes: Negative.   Respiratory: Positive for cough.        Sore throat  Cardiovascular: Negative.   Gastrointestinal: Negative.   Genitourinary: Negative.   Musculoskeletal: Positive for joint pain.  Skin: Negative.   Neurological: Negative.   Endo/Heme/Allergies: Negative.   Psychiatric/Behavioral: Positive  for substance abuse.    Blood pressure 131/71, pulse 97, temperature 98.7 F (37.1 C), temperature source Oral, resp. rate (!) 22, height _0  (1.753 m), weight 81.6 kg (180 lb), SpO2 99 %.Body mass index is 26.58 kg/m.  General Appearance: Bizarre  Eye Contact::  Poor  Speech:  Garbled  Volume:  Normal  Mood:  Anxious, Hopeless and Irritable  Affect:  Non-Congruent  Thought Process:  Coherent, Goal Directed, Linear and Descriptions of Associations: Loose  Orientation:  Full (Time, Place, and Person)  Thought Content:  Focused on "getting help"  Suicidal Thoughts:  No  Homicidal Thoughts:  No  Memory:  Immediate;   Fair Recent;   Fair Remote;   Fair  Judgement:  Poor  Insight:  Shallow  Psychomotor Activity:  Normal  Concentration:  Fair  Recall:  [place]: Fair  Akathisia:  No  Handed:  Right  AIMS (if indicated):     Assets:  Communication Skills Desire for Improvement Resilience  ADL's:  Intact  Cognition: WNL  Sleep:       Disposition: Recommend psychiatric Inpatient admission when medically cleared.  -Continue home Wellbutrin 26m po daily for depression -Continue Lamictal 267mpo daily for mood stabilization -Continue Zyprexa 7.91m56mo qhs for psychosis (May frontload 91mg19m zydis now) -Continue Trazodone 50mg92mqhs prn insomnia  WithrBenjamine Mola 10/15/2016 10:32 AM

## 2016-10-15 NOTE — ED Notes (Signed)
RN requested that he allow blood to be taken, he was compliant. Pt also urine which he also compliad w/ however he began crying stating that he needed a shower.  Tec took pt to shower

## 2016-10-15 NOTE — ED Notes (Signed)
Sitter at bedside.

## 2016-10-15 NOTE — BH Assessment (Addendum)
Tele Assessment Note   [name] is an 56 y.o. male. Pt with history of Schizophrenia and cocaine use. Pt sts he is having SI with a plan to walk into traffic to kill himself. Patient requesting FL-2 because he wants placement in a group home. Patient has either been homeless or incarcerated over the course of 15 yrs. He was recently released from jail (09/24/2016) after serving 15 months for drug related charges. He has since remained homeless and sts it is rough living on the streets. He rambles about witchcraft making him go in/out of jail and keeping him from getting a girlfriend. He reports prior suicide attempts but would not provide details. He denies HI. No history of violent or aggressive behaviors.Pt sts he has pending charges for Trespassing with no court date set and sts he is on probation from being released on 09/24/16. Denies HI, SHI and AVH. He received outpatient mental health services at [facility] but sts he has no MH provider now. He reports multiple INPT hospitalizations at various facilities on multiple dates for schizoaffective D/O. He reported cocaine use regularly. Pt reports that he has been smoking Cigarettes.  He has a 20.00 pack-year smoking history. He uses smokeless tobacco. He reported on 10/10/16 that he drinks alcohol and he reported that he uses drugs, including "Crack" cocaine, Cocaine, and Marijuana, about 7 times per week. Tonight he denies any drug/alcohol use since being released on probation from [place] on 09/24/16. Pt tested positive for Cocaine only tonight when tested in the [facility] tonight.   Pt was dressed in scrubs. Pt was alert, cooperative and polite. Pt kept good eye contact, spoke in a mumbling, slurred tone and at a rapid, pressured pace. Pt moved in a normal manner when moving. Pt's thought process was coherent for the most part and relevant and judgement/insight was impaired.  No indication of delusional thinking or response to internal stimuli. Pt's mood  was stated as depressed and he appeared anxious and his blunted affect was congruent.  Pt was oriented x 4, to person, place, time and situation.   Diagnosis: Schizoaffective D/O by hx; Cocaine Use D/O, Severe  Past Medical History:  Past Medical History:  Diagnosis Date  . CHF (congestive heart failure) (HCC)   . Chronic foot pain   . Cocaine abuse   . Diabetes mellitus without complication (HCC)   . Hepatitis C   . Homelessness   . Homelessness   . Hypertension   . Neuropathy   . Polysubstance abuse   . Schizophrenia (HCC)     History reviewed. No pertinent surgical history.  Family History:  Family History  Problem Relation Age of Onset  . Hypertension Other   . Diabetes Other     Social History:  reports that he has been smoking Cigarettes.  He has a 20.00 pack-year smoking history. He uses smokeless tobacco. He reports that he drinks alcohol. He reports that he uses drugs, including "Crack" cocaine, Cocaine, and Marijuana, about 7 times per week.  Additional Social History:  Alcohol / Drug Use History of alcohol / drug use?: Yes Longest period of sobriety (when/how long): UNKNOWN Substance #1 Name of Substance 1: CRACK COCAINE, COCAINE 1 - Age of First Use: 15 1 - Amount (size/oz): VARIES 1 - Frequency: DAILY PER REPORT ON 10/10/16- DENIES ANY USE TONIGHT-TESTED POSITIVE 1 - Duration: ONGOING 1 - Last Use / Amount: RECENTLY Substance #2 Name of Substance 2: ALCOHOL 2 - Age of First Use: 15 2 -  Amount (size/oz): "A FEW CANS OF BEER" 2 - Frequency: UNKNOWN 2 - Duration: ONGOING 2 - Last Use / Amount: UNKNOWN- TESTED NEGATIVE TONIGHT Substance #3 Name of Substance 3: NICOTINE/CIGARETTES 3 - Age of First Use: 15 3 - Amount (size/oz): ABOUT 1/2 PER WEEK PER PT RECORD 3 - Frequency: DAILY 3 - Duration: ONGOING 3 - Last Use / Amount: 10/15/16  CIWA: CIWA-Ar BP: (!) 154/75 Pulse Rate: 68 COWS:    PATIENT STRENGTHS: (choose at least two) Average or above  average intelligence Communication skills  Allergies:  Allergies  Allergen Reactions  . Haldol [Haloperidol] Other (See Comments)    Muscle spasms, loss of voluntary movement. However, pt has taken Thorazine on multiple occasions with no adverse effects.     Home Medications:  (Not in a hospital admission)  OB/GYN Status:  No LMP for male patient.  General Assessment Data Location of Assessment: [facility] ED TTS Assessment: In system Is this a Tele or Face-to-Face Assessment?: Tele Assessment Is this an Initial Assessment or a Re-assessment for this encounter?: Initial Assessment Marital status: Single Living Arrangements: Other (Comment) (HOMELESS) Can pt return to current living arrangement?: Yes Admission Status: Voluntary Is patient capable of signing voluntary admission?: Yes Referral Source: Self/Family/Friend Insurance type:  (MEDICARE)     Crisis Care Plan Living Arrangements: Other (Comment) (HOMELESS) Name of Psychiatrist:  (NONE) Name of Therapist:  (NONE)  Education Status Is patient currently in school?: No Highest grade of school patient has completed:  (UNKNOWN)  Risk to self with the past 6 months Suicidal Ideation: Yes-Currently Present Has patient been a risk to self within the past 6 months prior to admission? : Yes Suicidal Intent: Yes-Currently Present Has patient had any suicidal intent within the past 6 months prior to admission? : Yes Is patient at risk for suicide?: Yes Suicidal Plan?: Yes-Currently Present Has patient had any suicidal plan within the past 6 months prior to admission? : Yes Specify Current Suicidal Plan:  (PLAN TO WALK INTO TRAFFIC TO BE HIT & KILLED) Access to Means: Yes (STS NO ACCESS TO GUNS) Specify Access to Suicidal Means:  (TRAFFIC) What has been your use of drugs/alcohol within the last 12 months?:  (REGULAR USE) Previous Attempts/Gestures: No How many times?:  (0) Other Self Harm Risks:  (NONE REPORTED) Triggers for  Past Attempts: None known Intentional Self Injurious Behavior: None Family Suicide History: No Recent stressful life event(s): Other (Comment) (STS HOMELESSNESS & NO TRANSPORTATION) Persecutory voices/beliefs?: No Depression: Yes Depression Symptoms: Isolating, Fatigue, Guilt, Loss of interest in usual pleasures, Feeling worthless/self pity, Feeling angry/irritable Substance abuse history and/or treatment for substance abuse?: Yes Suicide prevention information given to non-admitted patients: Not applicable  Risk to Others within the past 6 months Homicidal Ideation: No (DENIES) Does patient have any lifetime risk of violence toward others beyond the six months prior to admission? : No Thoughts of Harm to Others: No (DENIES) Current Homicidal Intent: No Current Homicidal Plan: No Access to Homicidal Means: No Identified Victim:  (NONE) History of harm to others?: No Assessment of Violence: None Noted Violent Behavior Description:  (NA) Does patient have access to weapons?: No Criminal Charges Pending?: Yes Describe Pending Criminal Charges:  (STS HAS CURRENT [facility] FOR TRESPASSING) Does patient have a court date: No Is patient on probation?: Yes (RELEASED FROM JAIL ON 09/24/16)  Psychosis Hallucinations: None noted (DENIES) Delusions: None noted  Mental Status Report Appearance/Hygiene: Unremarkable Eye Contact: Good Motor Activity: Freedom of movement Speech: Rapid, Pressured Level of  Consciousness: Alert Mood: Depressed, Anxious Affect: Anxious, Blunted, Depressed Anxiety Level: Moderate Thought Processes: Coherent, Relevant Judgement: Impaired Orientation: Person, Place, Time, Situation Obsessive Compulsive Thoughts/Behaviors: None  Cognitive Functioning Concentration: Poor Memory: Recent Intact, Remote Intact IQ: Average Insight: Fair Impulse Control: Poor Appetite: Good Weight Loss:  (0) Weight Gain:  (0) Sleep: Decreased (STS CANNOT SLEEP IN [place] OR ON THE  STREET) Total Hours of Sleep:  (0-6) Vegetative Symptoms: None  ADLScreening [facility] Assessment Services) Patient's cognitive ability adequate to safely complete daily activities?: Yes Patient able to express need for assistance with ADLs?: Yes Independently performs ADLs?: Yes (appropriate for developmental age) (NO LIMITATION NOTED BUT HAS NEUROPATHY)  Prior Inpatient Therapy Prior Inpatient Therapy: Yes Prior Therapy Dates:  (MULTIPLE PER PT) Prior Therapy Facilty/Provider(s):  ([facility], MULTIPLE OTHERS) Reason for Treatment:  (SCHIZOAFFECTIVE D/O)  Prior Outpatient Therapy Prior Outpatient Therapy: Yes Prior Therapy Dates:  (2016) Prior Therapy Facilty/Provider(s):  [facility] Reason for Treatment:  (SCHIZOAFFECTIVE ) Does patient have an ACCT team?: No Does patient have Intensive In-House Services?  : No Does patient have [name] services? : No Does patient have P4CC services?: No  ADL Screening (condition at time of admission) Patient's cognitive ability adequate to safely complete daily activities?: Yes Patient able to express need for assistance with ADLs?: Yes Independently performs ADLs?: Yes (appropriate for developmental age) (NO LIMITATION NOTED BUT HAS NEUROPATHY)       Abuse/Neglect Assessment (Assessment to be complete while patient is alone) Physical Abuse: Denies Verbal Abuse: Denies Sexual Abuse: Denies Exploitation of patient/patient's resources: Denies Self-Neglect: Denies     Merchant navy officer (For Healthcare) Does Patient Have a Medical Advance Directive?: No Would patient like information on creating a medical advance directive?: No - Patient declined    Additional Information 1:1 In Past 12 Months?: Yes CIRT Risk: No Elopement Risk: No Does patient have medical clearance?: Yes     Disposition:  Disposition Initial Assessment Completed for this Encounter: Yes Disposition of Patient: Other dispositions Type of inpatient treatment program:  Adult Other disposition(s): Other (Comment) (PENDING REVIEW W [facility] EXTENDER)  Reviewed w [name], PA. Pt meets IP criteria. Recommend IP tx. Per Clint Bolder, AC: No appropriate beds available currently. TTS/SW will seek outside placement.    Beryle Flock, MS, CRC, [name] Triage Specialist [facility] T 10/15/2016 2:40 AM

## 2016-10-15 NOTE — ED Notes (Signed)
Charge at bedside but pt asleep. Charge will reassess once pt is awake.

## 2016-10-15 NOTE — ED Notes (Signed)
TTS in process 

## 2016-10-15 NOTE — ED Notes (Signed)
Pt angry because he would like a snack. Advised pt that snack time will be at 9pm. States that he would like to speak with charge RN. Charge made aware.

## 2016-10-15 NOTE — ED Provider Notes (Signed)
[facility]-EMERGENCY DEPT Provider Note   CSN: [id number] Arrival date & time: 10/14/16  2144     History   Chief Complaint Chief Complaint  Patient presents with  . Suicidal  . Foot Pain    HPI [name] is a 56 y.o. male.   Foot Pain      56 y.o. M with hx of DM, hep C, homelessness, Neuropathy, polysubstance abuse, schizophrenia, chronic foot pain, congestive heart failure, presenting to the ED with multiple complaints. Patient reports of chronic foot pain. States this is worse when he walks a lot. He is homeless so walks around a great deal during the day. States he feels suicidal. He plans to jump out into traffic in front of a car.  He denies any homicidal ideation. States he has had some alcohol and cocaine use recently.  He denies any chest pain or shortness of breath.  Lastly, patient is requesting a shower. States he feels "dirty".  Past Medical History:  Diagnosis Date  . CHF (congestive heart failure) (HCC)   . Chronic foot pain   . Cocaine abuse   . Diabetes mellitus without complication (HCC)   . Hepatitis C   . Homelessness   . Homelessness   . Hypertension   . Neuropathy   . Polysubstance abuse   . Schizophrenia [facility])     Patient Active Problem List   Diagnosis Date Noted  . Schizoaffective disorder, bipolar type (HCC) 09/30/2016  . Suicidal ideations   . Substance induced mood disorder (HCC) 03/13/2015  . Acute kidney failure (HCC) 01/26/2015  . Schizophrenia, paranoid type (HCC) 01/17/2015  . Suicidal ideation   . Drug hallucinosis (HCC) 10/08/2014  . Chronic paranoid schizophrenia (HCC) 09/07/2014  . 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 Acute CHF (congestive heart failure) (HCC) 07/29/2014  . Cocaine use disorder, severe, dependence (HCC)   . Essential hypertension, benign 03/28/2013  . Diabetes mellitus (HCC) 03/15/2013    History reviewed. No pertinent  surgical history.     Home Medications    Prior to Admission medications   Medication Sig Start Date End Date Taking? Authorizing Provider  amLODipine (NORVASC) 10 MG tablet Take 1 tablet (10 mg total) by mouth daily. 10/05/16  Yes Adonis Brook, NP  buPROPion (WELLBUTRIN) 75 MG tablet Take 1 tablet (75 mg total) by mouth daily. 10/05/16  Yes Adonis Brook, NP  hydrOXYzine (ATARAX/VISTARIL) 25 MG tablet Take 1 tablet (25 mg total) by mouth every 6 (six) hours as needed for anxiety. 10/04/16  Yes Adonis Brook, NP  lamoTRIgine (LAMICTAL) 25 MG tablet Take 1 tablet (25 mg total) by mouth daily. 10/05/16  Yes Adonis Brook, NP  nicotine (NICODERM CQ - DOSED IN MG/24 HOURS) 14 mg/24hr patch Place 1 patch (14 mg total) onto the skin daily. 10/05/16  Yes Adonis Brook, NP  OLANZapine (ZYPREXA) 7.5 MG tablet Take 1 tablet (7.5 mg total) by mouth at bedtime. 10/04/16  Yes Adonis Brook, NP  traZODone (DESYREL) 50 MG tablet Take 1 tablet (50 mg total) by mouth at bedtime. 10/04/16  Yes Adonis Brook, NP    Family History Family History  Problem Relation Age of Onset  . Hypertension Other   . Diabetes Other     Social History Social History  Substance Use Topics  . Smoking status: Current Every Day Smoker    Packs/day: 1.00    Years: 20.00    Types: Cigarettes  . Smokeless tobacco: Current User  .  Alcohol use Yes     Comment: Daily Drinker      Allergies   Haldol [haloperidol]   Review of Systems Review of Systems  Musculoskeletal: Positive for arthralgias.  Psychiatric/Behavioral: Positive for suicidal ideas.  All other systems reviewed and are negative.    Physical Exam Updated Vital Signs BP (!) 227/206 (BP Location: Right Arm)   Pulse 100   Temp 98.6 F (37 C) (Oral)   Resp 20   Ht  (1.753 m)   Wt 81.6 kg   SpO2 99%   BMI 26.58 kg/m   Physical Exam  Constitutional: He is oriented to person, place, and time. He appears well-developed and well-nourished.  HENT:    Head: Normocephalic and atraumatic.  Mouth/Throat: Oropharynx is clear and moist.  Eyes: Conjunctivae and EOM are normal. Pupils are equal, round, and reactive to light.  Neck: Normal range of motion.  Cardiovascular: Normal rate, regular rhythm and normal heart sounds.   Pulmonary/Chest: Effort normal and breath sounds normal. No respiratory distress. He has no wheezes.  Abdominal: Soft. Bowel sounds are normal. There is no tenderness. There is no rebound.  Musculoskeletal: Normal range of motion.  Feet atraumatic in nature, no open wounds or sores, no bony deformities or swelling; DP pulses intact bilaterally  Neurological: He is alert and oriented to person, place, and time.  Skin: Skin is warm and dry.  Psychiatric:  Appears agitated and hyperactive Admits to SI with plan to run into traffic, denies HI/AVH  Nursing note and vitals reviewed.    ED Treatments / Results  Labs (all labs ordered are listed, but only abnormal results are displayed) Labs Reviewed  COMPREHENSIVE METABOLIC PANEL - Abnormal; Notable for the following:       Result Value   Glucose, Bld 192 (*)    ALT 13 (*)    All other components within normal limits  ACETAMINOPHEN LEVEL - Abnormal; Notable for the following:    Acetaminophen (Tylenol), Serum <10 (*)    All other components within normal limits  CBC - Abnormal; Notable for the following:    RBC 4.14 (*)    Hemoglobin 11.7 (*)    HCT 37.7 (*)    All other components within normal limits  RAPID URINE DRUG SCREEN, HOSP PERFORMED - Abnormal; Notable for the following:    Cocaine POSITIVE (*)    All other components within normal limits  ETHANOL  SALICYLATE LEVEL    EKG  EKG Interpretation None       Radiology No results found.  Procedures Procedures (including critical care time)  Medications Ordered in ED Medications - No data to display   Initial Impression / Assessment and Plan / ED Course  [name] have reviewed the triage vital signs  and the nursing notes.  Pertinent labs & imaging results that were available during my care of the patient were reviewed by me and considered in my medical decision making (see chart for details).  56 year old male here with suicidal ideation and chronic foot pain. Taylor is well-known to this ED as well as myself from prior visits for same. He appears at his baseline. VSS.  Feet are without any signs of trauma or open wounds/sores. He has good DP pulses bilaterally and feet are warm and well-perfused. He remains ambulatory with steady gait. Screening lab work obtained and is reassuring. Patient medically cleared.  He was allowed to shower here, states he feels better.  TTS has evaluated patient, will seek placement. Holding  orders in place.  [name] have ordered his home medications as well.  Final Clinical Impressions(s) / ED Diagnoses   Final diagnoses:  Suicidal thoughts  Chronic foot pain, unspecified laterality    New Prescriptions New Prescriptions   No medications on file     Garlon Hatchet, PA-C 10/15/16 0534    Tomasita Crumble, MD 10/15/16 1445

## 2016-10-15 NOTE — ED Notes (Signed)
Sitting up on the side of the bed eating lunch

## 2016-10-16 DIAGNOSIS — M79673 Pain in unspecified foot: Secondary | ICD-10-CM

## 2016-10-16 DIAGNOSIS — R45851 Suicidal ideations: Secondary | ICD-10-CM

## 2016-10-16 DIAGNOSIS — F1420 Cocaine dependence, uncomplicated: Secondary | ICD-10-CM

## 2016-10-16 DIAGNOSIS — F1290 Cannabis use, unspecified, uncomplicated: Secondary | ICD-10-CM | POA: Diagnosis not present

## 2016-10-16 DIAGNOSIS — F1994 Other psychoactive substance use, unspecified with psychoactive substance-induced mood disorder: Secondary | ICD-10-CM

## 2016-10-16 DIAGNOSIS — F250 Schizoaffective disorder, bipolar type: Secondary | ICD-10-CM

## 2016-10-16 DIAGNOSIS — G8929 Other chronic pain: Secondary | ICD-10-CM | POA: Diagnosis not present

## 2016-10-16 DIAGNOSIS — F17210 Nicotine dependence, cigarettes, uncomplicated: Secondary | ICD-10-CM

## 2016-10-16 MED ORDER — GUAIFENESIN 100 MG/5ML PO SOLN: 5.0000 mL | Freq: Once | ORAL | Status: DC

## 2016-10-16 NOTE — ED Notes (Signed)
IVC preformed prior to my arrival and care for pt. I was told in report that he was IVC r/t flight risk. After speaking with [facility] via TTS they felt the pt was appropriate for DC. Rescind paperwork filled out by EDP and given to charge RN to fax over to [employer]

## 2016-10-16 NOTE — ED Notes (Signed)
Pt rcvd snack 

## 2016-10-16 NOTE — ED Notes (Signed)
Pt received verbal reassurance that he would have an opportunity to talk with a Child psychotherapist re: the possibility of someone helping him with getting his check, pt reports just being discharged from prison, pt cooperative at this time

## 2016-10-16 NOTE — ED Notes (Signed)
Meal given prior to pt leaving.

## 2016-10-16 NOTE — ED Notes (Signed)
Pt informed that at this time the recommendation is for inpatient treatment, [facility] AC to discuss pts request with provider to see if the recommendation can be changed, pt aware, pt encouraged to stay for inpatient treatment, pt aware that at this time if he does leave it will be necessary to IVC him for treatment, pt agrees to remain voluntary at this time

## 2016-10-16 NOTE — ED Notes (Signed)
Pt states, "I am ready to go. I have to get my check straight so I need to leave. I am  Homeless and need to get my money back. If I am any worse I will come back." pt informed of recommendation by counselor for inpatient referral, pt aware that this RN is awaiting return call from MD, pt calm at this time
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
## 2016-10-16 NOTE — ED Notes (Signed)
Pt requesting medication for cold symptoms, MD informed, pt requested social work consult, Asher Muir, RN informed social work of pts request, pt aware

## 2016-10-16 NOTE — Discharge Instructions (Signed)
Return to the ED with any concerns including thoughts or feelings of suicide or homicide, fainting, vomiting and not able to keep down liquids, decreased level of alertness/lethargy, or any other alarming symptoms

## 2016-10-16 NOTE — ED Notes (Signed)
Pt. Given meal and bus pass prior to leaving. Pt denies thoughts of hurting himself or others. NAD. ambulatory at DC.
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
## 2016-10-16 NOTE — ED Notes (Signed)
Pt has DM in medical history, pt does not have medication on file, pt blood sugar 192 upon arrival, will notify MD to determine if CBG monitoring will be ordered

## 2017-01-18 ENCOUNTER — Encounter (HOSPITAL_COMMUNITY): Payer: Self-pay | Admitting: Emergency Medicine

## 2017-01-18 ENCOUNTER — Emergency Department (HOSPITAL_COMMUNITY)
Admission: EM | Admit: 2017-01-18 | Discharge: 2017-01-18 | Disposition: A | Discharge status: Home / Self Care | Payer: Medicare Other | Attending: Emergency Medicine | Admitting: Emergency Medicine

## 2017-01-18 DIAGNOSIS — E119 Type 2 diabetes mellitus without complications: Secondary | ICD-10-CM | POA: Insufficient documentation

## 2017-01-18 DIAGNOSIS — I110 Hypertensive heart disease with heart failure: Secondary | ICD-10-CM | POA: Insufficient documentation

## 2017-01-18 DIAGNOSIS — F250 Schizoaffective disorder, bipolar type: Secondary | ICD-10-CM | POA: Insufficient documentation

## 2017-01-18 DIAGNOSIS — I509 Heart failure, unspecified: Secondary | ICD-10-CM | POA: Insufficient documentation

## 2017-01-18 DIAGNOSIS — F1420 Cocaine dependence, uncomplicated: Secondary | ICD-10-CM | POA: Diagnosis not present

## 2017-01-18 DIAGNOSIS — F1994 Other psychoactive substance use, unspecified with psychoactive substance-induced mood disorder: Secondary | ICD-10-CM | POA: Diagnosis not present

## 2017-01-18 DIAGNOSIS — F17210 Nicotine dependence, cigarettes, uncomplicated: Secondary | ICD-10-CM | POA: Insufficient documentation

## 2017-01-18 DIAGNOSIS — F209 Schizophrenia, unspecified: Secondary | ICD-10-CM

## 2017-01-18 DIAGNOSIS — F1410 Cocaine abuse, uncomplicated: Secondary | ICD-10-CM | POA: Insufficient documentation

## 2017-01-18 DIAGNOSIS — Z79899 Other long term (current) drug therapy: Secondary | ICD-10-CM | POA: Insufficient documentation

## 2017-01-18 LAB — COMPREHENSIVE METABOLIC PANEL
ALBUMIN: 4.3 g/dL (ref 3.5–5.0)
ALK PHOS: 110 U/L (ref 38–126)
ALT: 12 U/L — AB (ref 17–63)
AST: 24 U/L (ref 15–41)
Anion gap: 10 (ref 5–15)
BUN: 17 mg/dL (ref 6–20)
CALCIUM: 9.5 mg/dL (ref 8.9–10.3)
CO2: 26 mmol/L (ref 22–32)
CREATININE: 1.02 mg/dL (ref 0.61–1.24)
Chloride: 110 mmol/L (ref 101–111)
GFR calc Af Amer: >60 mL/min (ref >60)
GFR calc non Af Amer: >60 mL/min (ref >60)
Glucose, Bld: 107 mg/dL — ABNORMAL HIGH (ref 65–99)
Potassium: 3.3 mmol/L — ABNORMAL LOW (ref 3.5–5.1)
SODIUM: 146 mmol/L — AB (ref 135–145)
Total Bilirubin: 0.4 mg/dL (ref 0.3–1.2)
Total Protein: 8.1 g/dL (ref 6.5–8.1)

## 2017-01-18 LAB — ETHANOL: Alcohol, Ethyl (B): <5 mg/dL (ref <5)

## 2017-01-18 LAB — RAPID URINE DRUG SCREEN, HOSP PERFORMED
AMPHETAMINES: NOT DETECTED
Barbiturates: NOT DETECTED
Benzodiazepines: NOT DETECTED
COCAINE: POSITIVE — AB
Opiates: NOT DETECTED
TETRAHYDROCANNABINOL: NOT DETECTED

## 2017-01-18 LAB — CBC
HEMATOCRIT: 36.8 % — AB (ref 39.0–52.0)
HEMOGLOBIN: 11.9 g/dL — AB (ref 13.0–17.0)
MCH: 28.9 pg (ref 26.0–34.0)
MCHC: 32.3 g/dL (ref 30.0–36.0)
MCV: 89.3 fL (ref 78.0–100.0)
Platelets: 204 10*3/uL (ref 150–400)
RBC: 4.12 MIL/uL — ABNORMAL LOW (ref 4.22–5.81)
RDW: 14.5 % (ref 11.5–15.5)
WBC: 6.6 10*3/uL (ref 4.0–10.5)

## 2017-01-18 LAB — ACETAMINOPHEN LEVEL: Acetaminophen (Tylenol), Serum: <10 ug/mL — ABNORMAL LOW (ref 10–30)

## 2017-01-18 LAB — SALICYLATE LEVEL: Salicylate Lvl: <7 mg/dL (ref 2.8–30.0)

## 2017-01-18 MED ORDER — HYDROXYZINE HCL 25 MG PO TABS
25.0000 mg | ORAL_TABLET | Freq: Four times a day (QID) | ORAL | Status: DC | PRN
Start: 2017-01-18 — End: 2017-01-18

## 2017-01-18 MED ORDER — AMLODIPINE BESYLATE 5 MG PO TABS
10.0000 mg | ORAL_TABLET | Freq: Every day | ORAL | Status: DC
  Administered 2017-01-18: 10 mg via ORAL
  Filled 2017-01-18: qty 2

## 2017-01-18 MED ORDER — POTASSIUM CHLORIDE CRYS ER 20 MEQ PO TBCR
40.0000 meq | EXTENDED_RELEASE_TABLET | Freq: Once | ORAL | Status: AC
  Administered 2017-01-18: 40 meq via ORAL
  Filled 2017-01-18: qty 2

## 2017-01-18 MED ORDER — NICOTINE 14 MG/24HR TD PT24
14.0000 mg | MEDICATED_PATCH | Freq: Every day | TRANSDERMAL | Status: DC
  Administered 2017-01-18: 14 mg via TRANSDERMAL
  Filled 2017-01-18: qty 1

## 2017-01-18 MED ORDER — TRAZODONE HCL 50 MG PO TABS: 50.0000 mg | ORAL_TABLET | Freq: Every day | ORAL | Status: DC

## 2017-01-18 NOTE — ED Notes (Signed)
Pt taking a shower 

## 2017-01-18 NOTE — BH Assessment (Addendum)
Tele Assessment Note   [name] is an 56 y.o. male, who presents voluntary and unaccompanied to [facility]. Pt was non-verbal during most of the assessment. Clinician asked pt, "what brought you to the hospital?" Pt pointed to his throat and in a soft raspy voice, he replied, "my throat" but did not elaborate. Clinician asked the pt if he was suicide, pt nodded his head a the "yes" motion. Clinician asked the pt if he had a plan to how he wanted to commit suicide, pt shook his head "no." Clinician asked the pt if he experienced visual and/or auditory hallucinations. Pt shook his head, "no." During the assessment, clinician observed the pt masterbating under the cover, clinician ended the assessment.   Clinician was unable to assess the following: history of abuse, if he is linked to OPT resources, educational status, self-injurious behaviors, access to weapons, stressors, HI, history of violence, orientation, contract for safety, legal involvement, living situation, previous inpatient admissions. Pt's UDS is positive for crack cocaine.   Pt presents quiet/awake in scrubs with soft, raspy speech. Pt's eye contact was good. Pt's mood was preoccupied. Pt's affect was flat. Pt's judgement was impaired. Pt's concentration was fair. Pt's insight was poor. Pt's impulse control is fair.  Diagnosis: Schizophrenia [facility])   Past Medical History:  Past Medical History:  Diagnosis Date  . CHF (congestive heart failure) (HCC)   . Chronic foot pain   . Cocaine abuse   . Diabetes mellitus without complication (HCC)   . Hepatitis C   . Homelessness   . Homelessness   . Hypertension   . Neuropathy   . Polysubstance abuse   . Schizophrenia (HCC)     History reviewed. No pertinent surgical history.  Family History:  Family History  Problem Relation Age of Onset  . Hypertension Other   . Diabetes Other     Social History:  reports that he has been smoking Cigarettes.  He has a 20.00 pack-year  smoking history. He uses smokeless tobacco. He reports that he drinks alcohol. He reports that he uses drugs, including "Crack" cocaine, Cocaine, and Marijuana, about 7 times per week.  Additional Social History:  Alcohol / Drug Use Pain Medications: See MAR Prescriptions: See MAR Over the Counter: See MAR History of alcohol / drug use?: Yes Substance #1 Name of Substance 1: Crack cocaine 1 - Age of First Use: UTA 1 - Amount (size/oz): Pt's UDS is positive for crack cocaine. 1 - Frequency: UTA 1 - Duration: UTA 1 - Last Use / Amount: UTA  CIWA: CIWA-Ar BP: (!) 170/81 Pulse Rate: 64 COWS:    PATIENT STRENGTHS: (choose at least two) Average or above average intelligence General fund of knowledge  Allergies:  Allergies  Allergen Reactions  . Haldol [Haloperidol] Other (See Comments)    Muscle spasms, loss of voluntary movement. However, pt has taken Thorazine on multiple occasions with no adverse effects.     Home Medications:  (Not in a hospital admission)  OB/GYN Status:  No LMP for male patient.  General Assessment Data [facility] of Assessment: [facility] TTS Assessment: In system Is this a Tele or Face-to-Face Assessment?: Face-to-Face Is this an Initial Assessment or a Re-assessment for this encounter?: Initial Assessment Marital status: Divorced Living Arrangements:  (UTA) Can pt return to current living arrangement?:  (UTA) Admission Status: Voluntary Is patient capable of signing voluntary admission?: Yes Referral Source: Self/Family/Friend Insurance type: Medicare     Crisis Care Plan Living Arrangements:  (UTA) Legal  Guardian: Other: (Self) Name of Psychiatrist: UTA Name of Therapist: UTA  Education Status Is patient currently in school?:  (UTA) Current Grade: UTA Highest grade of school patient has completed: UTA Name of school: UTA Contact person: UTA  Risk to self with the past 6 months Suicidal Ideation: Yes-Currently Present Has patient been a  risk to self within the past 6 months prior to admission? : Yes Suicidal Intent: No Has patient had any suicidal intent within the past 6 months prior to admission? : Other (comment) (UTA) Is patient at risk for suicide?: Yes Suicidal Plan?: No (Pt denies having a plan. ) Has patient had any suicidal plan within the past 6 months prior to admission? : Other (comment) (UTA) Access to Means:  (UTA) What has been your use of drugs/alcohol within the last 12 months?: Crack cocaine.  Previous Attempts/Gestures:  (UTA) How many times?:  (UTA) Other Self Harm Risks: UTA Triggers for Past Attempts:  (UTA) Intentional Self Injurious Behavior:  (UTA) Family Suicide History: Unable to assess Recent stressful life event(s): Other (Comment) (UTA) Persecutory voices/beliefs?:  (UTA) Depression:  (UTA) Depression Symptoms:  (UTA) Substance abuse history and/or treatment for substance abuse?: Yes Suicide prevention information given to non-admitted patients: Not applicable  Risk to Others within the past 6 months Homicidal Ideation:  (UTA) Does patient have any lifetime risk of violence toward others beyond the six months prior to admission? :  (UTA) Thoughts of Harm to Others:  (UTA) Current Homicidal Intent: No Current Homicidal Plan:  (UTA) Access to Homicidal Means:  (UTA) Identified Victim: UTA History of harm to others?:  (UTA) Assessment of Violence:  (UTA) Violent Behavior Description: UTA Does patient have access to weapons?:  (UTA) Criminal Charges Pending?:  (UJTA) Does patient have a court date:  (UTA) Is patient on probation?:  (UTA)  Psychosis Hallucinations: Auditory, Visual Delusions: Unspecified  Mental Status Report Appearance/Hygiene: In scrubs Eye Contact: Good Motor Activity: Unremarkable Speech: Soft, Other (Comment) (pt's voice was very raspy. ) Level of Consciousness: Quiet/awake Mood: Preoccupied Affect: Flat Anxiety Level: None Thought Processes:  Circumstantial Judgement: Impaired Orientation: Unable to assess Obsessive Compulsive Thoughts/Behaviors: Unable to Assess  Cognitive Functioning Concentration: Fair Memory: Unable to Assess IQ: Average Insight: Poor Impulse Control: Fair Appetite:  (UTA) Weight Loss:  (UTA) Weight Gain:  (UTA) Sleep: Unable to Assess Total Hours of Sleep:  (UTA) Vegetative Symptoms: Unable to Assess  ADLScreening [facility]) Patient's cognitive ability adequate to safely complete daily activities?: Yes Patient able to express need for assistance with ADLs?: Yes Independently performs ADLs?: Yes (appropriate for developmental age)  Prior Inpatient Therapy Prior Inpatient Therapy:  (UTA) Prior Therapy Dates: UTA Prior Therapy Facilty/Provider(s): UTA Reason for Treatment: UTA  Prior Outpatient Therapy Prior Outpatient Therapy:  (UTA) Prior Therapy Dates: UTA Prior Therapy Facilty/Provider(s): UTA Reason for Treatment: UTA Does patient have an ACCT team?: Unknown Does patient have Intensive In-House Services?  : Unknown Does patient have [name] services? : Unknown Does patient have P4CC services?: Unknown  ADL Screening (condition at time of admission) Patient's cognitive ability adequate to safely complete daily activities?: Yes Is the patient deaf or have difficulty hearing?: No Does the patient have difficulty seeing, even when wearing glasses/contacts?: No Does the patient have difficulty concentrating, remembering, or making decisions?: Yes Patient able to express need for assistance with ADLs?: Yes Does the patient have difficulty dressing or bathing?: No Independently performs ADLs?: Yes (appropriate for developmental age) Does the patient have difficulty walking or  climbing stairs?: No Weakness of Legs: None Weakness of Arms/Hands: None       Abuse/Neglect Assessment (Assessment to be complete while patient is alone) Physical Abuse:  (UTA) Verbal Abuse:   (UTA) Sexual Abuse:  (UTA) Exploitation of patient/patient's resources:  (UTA) Self-Neglect:  (UTA)     Advance Directives (For Healthcare) Does Patient Have a Medical Advance Directive?: No Would patient like information on creating a medical advance directive?: No - Patient declined    Additional Information 1:1 In Past 12 Months?: No CIRT Risk: No Elopement Risk: No Does patient have medical clearance?: No     Disposition: Nira Conn, NP recommends inpatient treatment. Disposition discussed with Tresa Endo, PA and Darel Hong, RN. Per [facility] [place], [place] no appropriate beds available. TTS to seek placement.    Disposition Initial Assessment Completed for this Encounter: Yes Disposition of Patient:  (Pending.)  Redmond Pulling 01/18/2017 4:53 AM   Redmond Pulling, MS, [facility], [facility] Triage Specialist [phone]

## 2017-01-18 NOTE — ED Notes (Addendum)
Pt d/c home per MD order. Discharge summary reviewed with pt. Pt verbalizes understanding. Pt denies SI/HI. Bus pass provided per pt request. Pt irritable. Pt signed e-signature. Pt ambulatory off unit with security.

## 2017-01-18 NOTE — Discharge Instructions (Signed)
For your ongoing mental health needs, you are advised to follow up [facility]. ° °[facility] ([facility] °[address] °[place], [place] [address] °[phone] °New patients are seen at the walk-in clinic every Tuesday from 9:00 am - 12:00 pm. °

## 2017-01-18 NOTE — ED Notes (Signed)
Bed: [id number] Expected date:  Expected time:  Means of arrival:  Comments: [place]

## 2017-01-18 NOTE — ED Provider Notes (Signed)
[facility]-EMERGENCY DEPT Provider Note   CSN: [id number] Arrival date & time: 01/18/17  0148     History   Chief Complaint Chief Complaint  Patient presents with  . Suicidal    HPI [name] is a 56 y.o. male.  56 year old male with history of diabetes mellitus, hypertension, CHF, schizophrenia, polysubstance abuse, and homelessness presents to the emergency department for suicidal ideations. Patient states that he has plans to "jump off a mountain". He also believes that there is a "snake in my throat". He denies inability to swallow area he does state that he smokes crack cocaine yesterday.   The history is provided by the patient. No language interpreter was used.    Past Medical History:  Diagnosis Date  . CHF (congestive heart failure) (HCC)   . Chronic foot pain   . Cocaine abuse   . Diabetes mellitus without complication (HCC)   . Hepatitis C   . Homelessness   . Homelessness   . Hypertension   . Neuropathy   . Polysubstance abuse   . Schizophrenia [facility])     Patient Active Problem List   Diagnosis Date Noted  . Chronic foot pain   . Schizoaffective disorder, bipolar type (HCC) 09/30/2016  . Suicidal thoughts   . Substance induced mood disorder (HCC) 03/13/2015  . Acute kidney failure (HCC) 01/26/2015  . Schizophrenia, paranoid type (HCC) 01/17/2015  . Suicidal ideation   . Drug hallucinosis (HCC) 10/08/2014  . Chronic paranoid schizophrenia (HCC) 09/07/2014  . 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 Acute CHF (congestive heart failure) (HCC) 07/29/2014  . Cocaine use disorder, severe, dependence (HCC)   . Essential hypertension, benign 03/28/2013  . Diabetes mellitus (HCC) 03/15/2013    History reviewed. No pertinent surgical history.     Home Medications    Prior to Admission medications   Medication Sig Start Date End Date Taking? Authorizing Provider  amLODipine  (NORVASC) 10 MG tablet Take 1 tablet (10 mg total) by mouth daily. 10/05/16  Yes Adonis Brook, NP  buPROPion (WELLBUTRIN) 75 MG tablet Take 1 tablet (75 mg total) by mouth daily. 10/05/16  Yes Adonis Brook, NP  hydrOXYzine (ATARAX/VISTARIL) 25 MG tablet Take 1 tablet (25 mg total) by mouth every 6 (six) hours as needed for anxiety. 10/04/16  Yes Adonis Brook, NP  lamoTRIgine (LAMICTAL) 25 MG tablet Take 1 tablet (25 mg total) by mouth daily. 10/05/16  Yes Adonis Brook, NP  nicotine (NICODERM CQ - DOSED IN MG/24 HOURS) 14 mg/24hr patch Place 1 patch (14 mg total) onto the skin daily. 10/05/16  Yes Adonis Brook, NP  OLANZapine (ZYPREXA) 7.5 MG tablet Take 1 tablet (7.5 mg total) by mouth at bedtime. 10/04/16  Yes Adonis Brook, NP  traZODone (DESYREL) 50 MG tablet Take 1 tablet (50 mg total) by mouth at bedtime. 10/04/16  Yes Adonis Brook, NP    Family History Family History  Problem Relation Age of Onset  . Hypertension Other   . Diabetes Other     Social History Social History  Substance Use Topics  . Smoking status: Current Every Day Smoker    Packs/day: 1.00    Years: 20.00    Types: Cigarettes  . Smokeless tobacco: Current User  . Alcohol use Yes     Comment: Daily Drinker      Allergies   Haldol [haloperidol]   Review of Systems Review of Systems Ten systems reviewed and are negative for acute  change, except as noted in the HPI.    Physical Exam Updated Vital Signs BP (!) 170/81 (BP Location: Right Arm)   Pulse 64   Temp 98.3 F (36.8 C) (Oral)   Resp 16   Ht 5\' 10"  (1.778 m)   Wt 83.9 kg (185 lb)   SpO2 98%   BMI 26.54 kg/m   Physical Exam  Constitutional: He is oriented to person, place, and time. He appears well-developed and well-nourished. No distress.  Patient in NAD  HENT:  Head: Normocephalic and atraumatic.  Eyes: Conjunctivae and EOM are normal. No scleral icterus.  Neck: Normal range of motion.  Pulmonary/Chest: Effort normal. No  respiratory distress.  Respirations even and unlabored  Musculoskeletal: Normal range of motion.  Neurological: He is alert and oriented to person, place, and time. He exhibits normal muscle tone. Coordination normal.  Ambulatory with steady gait.  Skin: Skin is warm and dry. No rash noted. He is not diaphoretic. No erythema. No pallor.  Psychiatric: He has a normal mood and affect. His speech is normal. He is agitated (mild). He expresses suicidal ideation. He expresses no homicidal ideation. He expresses suicidal plans. He expresses no homicidal plans.  Seems mildly paranoid  Nursing note and vitals reviewed.    ED Treatments / Results  Labs (all labs ordered are listed, but only abnormal results are displayed) Labs Reviewed  COMPREHENSIVE METABOLIC PANEL - Abnormal; Notable for the following:       Result Value   Sodium 146 (*)    Potassium 3.3 (*)    Glucose, Bld 107 (*)    ALT 12 (*)    All other components within normal limits  ACETAMINOPHEN LEVEL - Abnormal; Notable for the following:    Acetaminophen (Tylenol), Serum <10 (*)    All other components within normal limits  CBC - Abnormal; Notable for the following:    RBC 4.12 (*)    Hemoglobin 11.9 (*)    HCT 36.8 (*)    All other components within normal limits  RAPID URINE DRUG SCREEN, HOSP PERFORMED - Abnormal; Notable for the following:    Cocaine POSITIVE (*)    All other components within normal limits  ETHANOL  SALICYLATE LEVEL    EKG  EKG Interpretation None       Radiology No results found.  Procedures Procedures (including critical care time)  Medications Ordered in ED Medications  potassium chloride SA (K-DUR,KLOR-CON) CR tablet 40 mEq (not administered)     Initial Impression / Assessment and Plan / ED Course  I have reviewed the triage vital signs and the nursing notes.  Pertinent labs & imaging results that were available during my care of the patient were reviewed by me and considered  in my medical decision making (see chart for details).     Patient with history of homelessness presents for suicidal ideations. He expresses plans to "jump off a mountain". He does report crack cocaine use yesterday. The patient has been medically cleared and evaluated by TTS who recommended inpatient placement. He will also be assessed by psychiatry in the morning. Disposition to be determined by oncoming ED provider.   Final Clinical Impressions(s) / ED Diagnoses   Final diagnoses:  Schizophrenia, unspecified type (HCC)  Cocaine abuse    New Prescriptions New Prescriptions   No medications on file     Antony MaduraHumes, Westlee Devita, Cordelia Poche-C 01/18/17 0549    Paula LibraMolpus, John, MD 01/18/17 [phone]

## 2017-01-18 NOTE — ED Triage Notes (Addendum)
Patient called EMS because he feels like he has food stuck in his throat. Patient states he wants to kill himself by jumping off a mountain. Patient has been off medications for two days.

## 2017-01-18 NOTE — [facility] Suicide Risk Assessment (Cosign Needed)
Suicide Risk Assessment  Discharge Assessment   [facility]   Principal Problem: Cocaine use disorder, severe, dependence [facility](HCC) Discharge Diagnoses:  Patient Active Problem List   Diagnosis Date Noted  . Chronic foot pain [M79.673, G89.29]   . Schizoaffective disorder, bipolar type (HCC) [F25.0] 09/30/2016  . Suicidal thoughts [R45.851]   . Substance induced mood disorder (HCC) [F19.94] 03/13/2015  . Acute kidney failure (HCC) [N17.9] 01/26/2015  . Schizophrenia, paranoid type (HCC) [F20.0] 01/17/2015  . Suicidal ideation [R45.851]   . 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 Acute CHF (congestive heart failure) (HCC) [I50.9] 07/29/2014  . Cocaine use disorder, severe, dependence (HCC) [F14.20]   . Essential hypertension, benign [I10] 03/28/2013  . Diabetes mellitus (HCC) [E11.9] 03/15/2013    Total Time spent with patient: 30 minutes  Musculoskeletal: Strength & Muscle Tone: within normal limits Gait & Station: normal Patient leans: N/A  Psychiatric Specialty Exam:   Blood pressure 138/74, pulse (!) 54, temperature 98.4 F (36.9 C), temperature source Oral, resp. rate 18, height 5\' 10"  (1.778 m), weight 83.9 kg (185 lb), SpO2 99 %.Body mass index is 26.54 kg/m.   General Appearance: Fairly Groomed  Eye Contact:  Good  Speech:  Normal Rate and Raspy  Volume:  Normal  Mood:  Anxious  Affect:  Congruent  Thought Process:  Goal Directed  Orientation:  Full (Time, Place, and Person)  Thought Content:  Logical  Suicidal Thoughts:  No  Homicidal Thoughts:  No  Memory:  Immediate;   Good Recent;   Good Remote;   Good  Judgement:  Fair  Insight:  Fair  Psychomotor Activity:  Normal  Concentration:  Concentration: Good and Attention Span: Good  Recall:  Good   Fund of Knowledge:  Fair  Language:  Good  Akathisia:  No  Handed:  Right  AIMS (if indicated):     Assets:  Communication Skills Desire for Improvement Intimacy Social Support  ADL's:  Intact  Cognition:  WNL  Sleep:         Mental Status Per Nursing Assessment::   On Admission:    suicidal ideation  Demographic Factors:  Male and Low socioeconomic status  Loss Factors: NA  Historical Factors: NA  Risk Reduction Factors:   Responsible for children under 718 years of age and Living with another person, especially a relative  Continued Clinical Symptoms:  Alcohol/Substance Abuse/Dependencies Previous Psychiatric Diagnoses and Treatments  Cognitive Features That Contribute To Risk:  None    Suicide Risk:  Minimal: No identifiable suicidal ideation.  Patients presenting with no risk factors but with morbid ruminations; may be classified as minimal risk based on the severity of the depressive symptoms    Plan Of Care/Follow-up recommendations:  Activity:  As tolerated Diet:  Heart Healthy  Rankin, Shuvon, NP 01/18/2017, 1:03 PM

## 2017-01-18 NOTE — ED Notes (Signed)
Bed: [facility] Expected date:  Expected time:  Means of arrival:  Comments: 

## 2017-01-18 NOTE — ED Notes (Signed)
SBAR Report received from previous nurse. Pt received calm and visible on unit. Pt gave no meaningful answers to assessment questions related to current SI/ HI, A/V H, depression, anxiety, or pain at this time, and appears otherwise stable and free of distress. Pt reminded of camera surveillance, q 15 min rounds, and rules of the milieu. Will continue to assess. 

## 2017-01-18 NOTE — Consult Note (Signed)
[facility] Consult   Reason for Consult:  Suicidal ideation Referring Physician:  EDP Patient Identification: [name] MRN:  [id number] Principal Diagnosis: Cocaine use disorder, severe, dependence ([place]) Diagnosis:   Patient Active Problem List   Diagnosis Date Noted  . Chronic foot pain [M79.673, G89.29]   . Schizoaffective disorder, bipolar type ([place]) [F25.0] 09/30/2016  . Suicidal thoughts [R45.851]   . Substance induced mood disorder ([place]) [F19.94] 03/13/2015  . Acute kidney failure ([place]) [N17.9] 01/26/2015  . Schizophrenia, paranoid type ([place]) [F20.0] 01/17/2015  . Suicidal ideation [R45.851]   . Drug hallucinosis ([place]) [F19.951] 10/08/2014  . Chronic paranoid schizophrenia ([place]) [F20.0] 09/07/2014  . Substance or medication-induced bipolar and related disorder with onset during intoxication ([place]) [F19.94] 08/10/2014  . Acute CHF (congestive heart failure) ([place]) [I50.9] 07/29/2014  . Cocaine use disorder, severe, dependence ([place]) [F14.20]   . Essential hypertension, benign [I10] 03/28/2013  . Diabetes mellitus ([place]) [E11.9] 03/15/2013    Total Time spent with patient: 45 minutes  Subjective:   [name] is a 56 y.o. male patient presented to [facility] with complaints of suicidal ideation.  HPI:  Patient seen by Dr. Louretta Shorten and this provider.  Chart reviewed and face to face evaluation on 01/18/17.   On evaluation:  [name] reports that he came to the hospital to see about his throat and to get help getting into a family care home.  Reports that he was in prison for 15 months and just got out 2 days ago.  States that he is staying with a cousin but the neighborhood is bad and that if he stays there he will be on drugs again.  At this time patient denies suicidal/homicidal ideation, psychosis, and paranoia.     Past Psychiatric History: Cocaine dependence/abuse, Substance induced mood disorder, Schizophrenia  Risk to Self:  Suicidal Ideation: Yes-Currently Present Suicidal Intent: No Is patient at risk for suicide?: Yes Suicidal Plan?: No (Pt denies having a plan. ) Access to Means:  (UTA) What has been your use of drugs/alcohol within the last 12 months?: Crack cocaine.  How many times?:  (UTA) Other Self Harm Risks: UTA Triggers for Past Attempts:  (UTA) Intentional Self Injurious Behavior:  (UTA) Risk to Others: Homicidal Ideation:  (UTA) Thoughts of Harm to Others:  (UTA) Current Homicidal Intent: No Current Homicidal Plan:  (UTA) Access to Homicidal Means:  (UTA) Identified Victim: UTA History of harm to others?:  (UTA) Assessment of Violence:  (UTA) Violent Behavior Description: UTA Does patient have access to weapons?:  (UTA) Criminal Charges Pending?:  (UJTA) Does patient have a court date:  Special educational needs teacher) Prior Inpatient Therapy: Prior Inpatient Therapy:  (UTA) Prior Therapy Dates: UTA Prior Therapy Facilty/Provider(s): UTA Reason for Treatment: UTA Prior Outpatient Therapy: Prior Outpatient Therapy:  (UTA) Prior Therapy Dates: UTA Prior Therapy Facilty/Provider(s): UTA Reason for Treatment: UTA Does patient have an ACCT team?: Unknown Does patient have Intensive In-House Services?  : Unknown Does patient have [name] services? : Unknown Does patient have P4CC services?: Unknown  Past Medical History:  Past Medical History:  Diagnosis Date  . CHF (congestive heart failure) ([place])   . Chronic foot pain   . Cocaine abuse   . Diabetes mellitus without complication ([place])   . Hepatitis C   . Homelessness   . Homelessness   . Hypertension   . Neuropathy   . Polysubstance abuse   . Schizophrenia ([place])    History reviewed. No pertinent surgical history. Family History:  Family History  Problem Relation Age of Onset  . Hypertension Other   . Diabetes Other    Family Psychiatric  History: Unaware Social History:  History  Alcohol Use  . Yes    Comment: Daily Drinker      History   Drug Use  . Frequency: 7.0 times per week  . Types: "Crack" cocaine, Cocaine, Marijuana    Comment: Cocaine tonight, Marijuana "a long time"    Social History   Social History  . Marital status: Divorced    Spouse name: N/A  . Number of children: N/A  . Years of education: N/A   Social History Main Topics  . Smoking status: Current Every Day Smoker    Packs/day: 1.00    Years: 20.00    Types: Cigarettes  . Smokeless tobacco: Current User  . Alcohol use Yes     Comment: Daily Drinker   . Drug use: Yes    Frequency: 7.0 times per week    Types: "Crack" cocaine, Cocaine, Marijuana     Comment: Cocaine tonight, Marijuana "a long time"  . Sexual activity: No   Other Topics Concern  . None   Social History Narrative   ** Merged History Encounter **       Additional Social History:    Allergies:   Allergies  Allergen Reactions  . Haldol [Haloperidol] Other (See Comments)    Muscle spasms, loss of voluntary movement. However, pt has taken Thorazine on multiple occasions with no adverse effects.     Labs:  Results for orders placed or performed during the hospital encounter of 01/18/17 (from the past 48 hour(s))  Rapid urine drug screen (hospital performed)     Status: Abnormal   Collection Time: 01/18/17  2:25 AM  Result Value Ref Range   Opiates NONE DETECTED NONE DETECTED   Cocaine POSITIVE (A) NONE DETECTED   Benzodiazepines NONE DETECTED NONE DETECTED   Amphetamines NONE DETECTED NONE DETECTED   Tetrahydrocannabinol NONE DETECTED NONE DETECTED   Barbiturates NONE DETECTED NONE DETECTED    Comment:        DRUG SCREEN FOR MEDICAL PURPOSES ONLY.  IF CONFIRMATION IS NEEDED FOR ANY PURPOSE, NOTIFY LAB WITHIN 5 DAYS.        LOWEST DETECTABLE LIMITS FOR URINE DRUG SCREEN Drug Class       Cutoff (ng/mL) Amphetamine      1000 Barbiturate      200 Benzodiazepine   712 Tricyclics       458 Opiates          300 Cocaine          300 THC              50    Comprehensive metabolic panel     Status: Abnormal   Collection Time: 01/18/17  2:33 AM  Result Value Ref Range   Sodium 146 (H) 135 - 145 mmol/L   Potassium 3.3 (L) 3.5 - 5.1 mmol/L   Chloride 110 101 - 111 mmol/L   CO2 26 22 - 32 mmol/L   Glucose, Bld 107 (H) 65 - 99 mg/dL   BUN 17 6 - 20 mg/dL   Creatinine, Ser 1.02 0.61 - 1.24 mg/dL   Calcium 9.5 8.9 - 10.3 mg/dL   Total Protein 8.1 6.5 - 8.1 g/dL   Albumin 4.3 3.5 - 5.0 g/dL   AST 24 15 - 41 U/L   ALT 12 (L) 17 - 63 U/L   Alkaline Phosphatase 110  38 - 126 U/L   Total Bilirubin 0.4 0.3 - 1.2 mg/dL   GFR calc non Af Amer >60 >60 mL/min   GFR calc Af Amer >60 >60 mL/min    Comment: (NOTE) The eGFR has been calculated using the CKD EPI equation. This calculation has not been validated in all clinical situations. eGFR's persistently <60 mL/min signify possible Chronic Kidney Disease.    Anion gap 10 5 - 15  Ethanol     Status: None   Collection Time: 01/18/17  2:33 AM  Result Value Ref Range   Alcohol, Ethyl (B) <5 <5 mg/dL    Comment:        LOWEST DETECTABLE LIMIT FOR SERUM ALCOHOL IS 5 mg/dL FOR MEDICAL PURPOSES ONLY   Salicylate level     Status: None   Collection Time: 01/18/17  2:33 AM  Result Value Ref Range   Salicylate Lvl <7.1 2.8 - 30.0 mg/dL  Acetaminophen level     Status: Abnormal   Collection Time: 01/18/17  2:33 AM  Result Value Ref Range   Acetaminophen (Tylenol), Serum <10 (L) 10 - 30 ug/mL    Comment:        THERAPEUTIC CONCENTRATIONS VARY SIGNIFICANTLY. A RANGE OF 10-30 ug/mL MAY BE AN EFFECTIVE CONCENTRATION FOR MANY PATIENTS. HOWEVER, SOME ARE BEST TREATED AT CONCENTRATIONS OUTSIDE THIS RANGE. ACETAMINOPHEN CONCENTRATIONS >150 ug/mL AT 4 HOURS AFTER INGESTION AND >50 ug/mL AT 12 HOURS AFTER INGESTION ARE OFTEN ASSOCIATED WITH TOXIC REACTIONS.   cbc     Status: Abnormal   Collection Time: 01/18/17  2:33 AM  Result Value Ref Range   WBC 6.6 4.0 - 10.5 K/uL   RBC 4.12 (L) 4.22 - 5.81  MIL/uL   Hemoglobin 11.9 (L) 13.0 - 17.0 g/dL   HCT 36.8 (L) 39.0 - 52.0 %   MCV 89.3 78.0 - 100.0 fL   MCH 28.9 26.0 - 34.0 pg   MCHC 32.3 30.0 - 36.0 g/dL   RDW 14.5 11.5 - 15.5 %   Platelets 204 150 - 400 K/uL    Current Facility-Administered Medications  Medication Dose Route Frequency Provider Last Rate Last Dose  . amLODipine (NORVASC) tablet 10 mg  10 mg Oral Daily Antonietta Breach, PA-C   10 mg at 01/18/17 6967  . hydrOXYzine (ATARAX/VISTARIL) tablet 25 mg  25 mg Oral Q6H PRN Antonietta Breach, PA-C      . nicotine (NICODERM CQ - dosed in mg/24 hours) patch 14 mg  14 mg Transdermal Daily Antonietta Breach, PA-C   14 mg at 01/18/17 0917  . traZODone (DESYREL) tablet 50 mg  50 mg Oral QHS Antonietta Breach, PA-C       Current Outpatient Prescriptions  Medication Sig Dispense Refill  . amLODipine (NORVASC) 10 MG tablet Take 1 tablet (10 mg total) by mouth daily. 30 tablet 0  . buPROPion (WELLBUTRIN) 75 MG tablet Take 1 tablet (75 mg total) by mouth daily. 30 tablet 0  . hydrOXYzine (ATARAX/VISTARIL) 25 MG tablet Take 1 tablet (25 mg total) by mouth every 6 (six) hours as needed for anxiety. 30 tablet 0  . lamoTRIgine (LAMICTAL) 25 MG tablet Take 1 tablet (25 mg total) by mouth daily. 30 tablet 0  . nicotine (NICODERM CQ - DOSED IN MG/24 HOURS) 14 mg/24hr patch Place 1 patch (14 mg total) onto the skin daily. 28 patch 0  . OLANZapine (ZYPREXA) 7.5 MG tablet Take 1 tablet (7.5 mg total) by mouth at bedtime. 30 tablet 0  . traZODone (DESYREL)  50 MG tablet Take 1 tablet (50 mg total) by mouth at bedtime. 30 tablet 0    Musculoskeletal: Strength & Muscle Tone: within normal limits Gait & Station: normal Patient leans: N/A  Psychiatric Specialty Exam: Physical Exam  Nursing note and vitals reviewed. Constitutional: He is oriented to person, place, and time.  Neck: Normal range of motion.  Respiratory: Effort normal.  Neurological: He is alert and oriented to person, place, and time.  Psychiatric:  He has a normal mood and affect. His speech is normal and behavior is normal. Thought content is not delusional. Cognition and memory are normal. He expresses impulsivity. He expresses homicidal ideation.    Review of Systems  Psychiatric/Behavioral: Positive for substance abuse. Negative for depression, hallucinations, memory loss and suicidal ideas. The patient is not nervous/anxious and does not have insomnia.   All other systems reviewed and are negative.   Blood pressure 138/74, pulse (!) 54, temperature 98.4 F (36.9 C), temperature source Oral, resp. rate 18, height '5\' 10"'  (1.778 m), weight 83.9 kg (185 lb), SpO2 99 %.Body mass index is 26.54 kg/m.  General Appearance: Fairly Groomed  Eye Contact:  Good  Speech:  Normal Rate and Raspy  Volume:  Normal  Mood:  Anxious  Affect:  Congruent  Thought Process:  Goal Directed  Orientation:  Full (Time, Place, and Person)  Thought Content:  Logical  Suicidal Thoughts:  No  Homicidal Thoughts:  No  Memory:  Immediate;   Good Recent;   Good Remote;   Good  Judgement:  Fair  Insight:  Fair  Psychomotor Activity:  Normal  Concentration:  Concentration: Good and Attention Span: Good  Recall:  Good  Fund of Knowledge:  Fair  Language:  Good  Akathisia:  No  Handed:  Right  AIMS (if indicated):     Assets:  Communication Skills Desire for Improvement Intimacy Social Support  ADL's:  Intact  Cognition:  WNL  Sleep:        Treatment Plan Summary: Plan Discharge  Follow [facility] with [facility]; Continue current medications  Disposition: No evidence of imminent risk to self or others at present.   Patient does not meet criteria for psychiatric inpatient admission.  Rankin, Delphia Grates, NP 01/18/2017 12:51 PM   Patient seen, chart reviewed for this face-to-face psychiatric consultation and evaluation and case discussed with physician extender, and formulated treatment plan. Reviewed the information documented and agree with  the treatment plan.  Adna Nofziger 01/18/2017 2:54 PM

## 2017-01-18 NOTE — ED Notes (Signed)
Pt refused PO meds at this time

## 2017-01-23 ENCOUNTER — Emergency Department (HOSPITAL_COMMUNITY)
Admission: EM | Admit: 2017-01-23 | Discharge: 2017-01-23 | Disposition: A | Discharge status: Home / Self Care | Payer: Medicare Other | Attending: Emergency Medicine | Admitting: Emergency Medicine

## 2017-01-23 ENCOUNTER — Encounter (HOSPITAL_COMMUNITY): Payer: Self-pay

## 2017-01-23 DIAGNOSIS — F17210 Nicotine dependence, cigarettes, uncomplicated: Secondary | ICD-10-CM | POA: Insufficient documentation

## 2017-01-23 DIAGNOSIS — I509 Heart failure, unspecified: Secondary | ICD-10-CM | POA: Insufficient documentation

## 2017-01-23 DIAGNOSIS — M79675 Pain in left toe(s): Secondary | ICD-10-CM | POA: Insufficient documentation

## 2017-01-23 DIAGNOSIS — I159 Secondary hypertension, unspecified: Secondary | ICD-10-CM | POA: Insufficient documentation

## 2017-01-23 DIAGNOSIS — Z79899 Other long term (current) drug therapy: Secondary | ICD-10-CM | POA: Insufficient documentation

## 2017-01-23 DIAGNOSIS — M79676 Pain in unspecified toe(s): Secondary | ICD-10-CM

## 2017-01-23 DIAGNOSIS — B351 Tinea unguium: Secondary | ICD-10-CM

## 2017-01-23 DIAGNOSIS — E119 Type 2 diabetes mellitus without complications: Secondary | ICD-10-CM | POA: Insufficient documentation

## 2017-01-23 DIAGNOSIS — M79674 Pain in right toe(s): Secondary | ICD-10-CM | POA: Insufficient documentation

## 2017-01-23 MED ORDER — ACETAMINOPHEN 325 MG PO TABS
ORAL_TABLET | ORAL | Status: AC
  Filled 2017-01-23: qty 2

## 2017-01-23 MED ORDER — ACETAMINOPHEN 325 MG PO TABS
650.0000 mg | ORAL_TABLET | Freq: Once | ORAL | Status: AC
  Administered 2017-01-23: 650 mg via ORAL

## 2017-01-23 MED ORDER — IBUPROFEN 400 MG PO TABS
ORAL_TABLET | ORAL | Status: AC
  Filled 2017-01-23: qty 1

## 2017-01-23 MED ORDER — IBUPROFEN 400 MG PO TABS
400.0000 mg | ORAL_TABLET | Freq: Once | ORAL | Status: DC | PRN
  Filled 2017-01-23: qty 1

## 2017-01-23 NOTE — ED Triage Notes (Signed)
Pt here for foot pain and request tylenol

## 2017-01-23 NOTE — ED Provider Notes (Signed)
[facility]-EMERGENCY DEPT Provider Note   CSN: [id number] Arrival date & time: 01/23/17  0033     History   Chief Complaint Chief Complaint  Patient presents with  . Foot Pain    HPI [name] is a 56 y.o. male.  HPI [name] is a 56 y.o. male with history of CHF, polysubstance abuse, diabetes, hypertension, schizophrenia, presents to emergency department complaining of bilateral big toenail pain. Patient states his right great toenail fell off several days ago and reports pain to the area. He states he left great toe has fungal infections sometimes it's painful. He denies any injuries. Denies any fever or chills. No sores or lesions to the toes. He denies fever or chills. States he does not have a family doctor. He states that he is homeless, has no family, has no one to take care of him.  Past Medical History:  Diagnosis Date  . CHF (congestive heart failure) ([facility])   . Chronic foot pain   . Cocaine abuse   . Diabetes mellitus without complication ([facility])   . Hepatitis C   . Homelessness   . Homelessness   . Hypertension   . Neuropathy   . Polysubstance abuse   . Schizophrenia [facility](HCC)     Patient Active Problem List   Diagnosis Date Noted  . Chronic foot pain   . Schizoaffective disorder, bipolar type ([facility]) 09/30/2016  . Suicidal thoughts   . Substance induced mood disorder ([facility]) 03/13/2015  . Acute kidney failure ([facility]) 01/26/2015  . Schizophrenia, paranoid type ([facility]) 01/17/2015  . Suicidal ideation   . Drug hallucinosis ([facility]) 10/08/2014  . Chronic paranoid schizophrenia ([facility]) 09/07/2014  . 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 Acute CHF (congestive heart failure) ([facility]) 07/29/2014  . Cocaine use disorder, severe, dependence ([facility])   . Essential hypertension, benign 03/28/2013  . Diabetes mellitus ([facility]) 03/15/2013    History reviewed. No pertinent surgical history.     Home  Medications    Prior to Admission medications   Medication Sig Start Date End Date Taking? Authorizing Provider  amLODipine (NORVASC) 10 MG tablet Take 1 tablet (10 mg total) by mouth daily. 10/05/16   Adonis BrookAgustin, Sheila, NP  buPROPion (WELLBUTRIN) 75 MG tablet Take 1 tablet (75 mg total) by mouth daily. 10/05/16   Adonis BrookAgustin, Sheila, NP  hydrOXYzine (ATARAX/VISTARIL) 25 MG tablet Take 1 tablet (25 mg total) by mouth every 6 (six) hours as needed for anxiety. 10/04/16   Adonis BrookAgustin, Sheila, NP  lamoTRIgine (LAMICTAL) 25 MG tablet Take 1 tablet (25 mg total) by mouth daily. 10/05/16   Adonis BrookAgustin, Sheila, NP  nicotine (NICODERM CQ - DOSED IN MG/24 HOURS) 14 mg/24hr patch Place 1 patch (14 mg total) onto the skin daily. 10/05/16   Adonis BrookAgustin, Sheila, NP  OLANZapine (ZYPREXA) 7.5 MG tablet Take 1 tablet (7.5 mg total) by mouth at bedtime. 10/04/16   Adonis BrookAgustin, Sheila, NP  traZODone (DESYREL) 50 MG tablet Take 1 tablet (50 mg total) by mouth at bedtime. 10/04/16   Adonis BrookAgustin, Sheila, NP    Family History Family History  Problem Relation Age of Onset  . Hypertension Other   . Diabetes Other     Social History Social History  Substance Use Topics  . Smoking status: Current Every Day Smoker    Packs/day: 1.00    Years: 20.00    Types: Cigarettes  . Smokeless tobacco: Current User  . Alcohol use Yes  Comment: Daily Drinker      Allergies   Haldol [haloperidol]   Review of Systems Review of Systems  Constitutional: Negative for chills and fever.  Musculoskeletal: Positive for arthralgias.  Skin: Positive for wound.  All other systems reviewed and are negative.    Physical Exam Updated Vital Signs BP (!) 175/103 (BP Location: Left Arm)   Pulse 77   Temp 98.7 F (37.1 C) (Oral)   Resp 20   Ht 5\' 10"  (1.778 m)   Wt 83.9 kg (185 lb)   SpO2 99%   BMI 26.54 kg/m   Physical Exam  Constitutional: He appears well-developed and well-nourished. No distress.  Eyes: Conjunctivae are normal.  Neck: Neck  supple.  Cardiovascular: Normal rate.   Pulmonary/Chest: No respiratory distress.  Abdominal: He exhibits no distension.  Musculoskeletal:  Normal appearing right great toe with absent toenail with new healthy toenail growing in. No erythema, wounds, drainage, swelling, tenderness. Fungal infection of the toenail on left foot otherwise normal toe.   Skin: Skin is warm and dry.  Nursing note and vitals reviewed.    ED Treatments / Results  Labs (all labs ordered are listed, but only abnormal results are displayed) Labs Reviewed - No data to display  EKG  EKG Interpretation None       Radiology No results found.  Procedures Procedures (including critical care time)  Medications Ordered in ED Medications  ibuprofen (ADVIL,MOTRIN) tablet 400 mg (not administered)  acetaminophen (TYLENOL) tablet 650 mg (650 mg Oral Given 01/23/17 0055)     Initial Impression / Assessment and Plan / ED Course  I have reviewed the triage vital signs and the nursing notes.  Pertinent labs & imaging results that were available during my care of the patient were reviewed by me and considered in my medical decision making (see chart for details).     Patient in emergency department bilateral great toe pain. He is complaining about pain to his toenails. His right great toenail is avulsed with new healthy toenail growing in. There is no signs of infection. There is no signs of trauma. Left great toenail is hardened and discolored, consistent with fungal infection. I suspect patient states her pain could be related to the shoes, he states his shoes are too small. At this time no further treatment indicated emergency department. He was given Tylenol and Motrin for pain. Will discharge home with Tylenol, follow-up with family doctor. His blood pressure is elevated emergency department. He states that he did not take his blood pressure medications. Instructed to make sure to take his medications and  follow-up in one week for recheck.  Vitals:   01/23/17 0041 01/23/17 0423  BP: (!) 178/98 (!) 175/103  Pulse: 74 77  Resp: 20 20  Temp: 98.7 F (37.1 C)   TempSrc: Oral   SpO2: 97% 99%  Weight: 83.9 kg (185 lb)   Height: 5\' 10"  (1.778 m)      Final Clinical Impressions(s) / ED Diagnoses   Final diagnoses:  Great toe pain, unspecified laterality  Onychomycosis of left great toe  Secondary hypertension    New Prescriptions New Prescriptions   No medications on file     Jaynie CrumbleKirichenko, Hadasah Brugger, Cordelia Poche-C 01/23/17 0502    Dione BoozeGlick, David, MD 01/23/17 [phone]

## 2017-01-23 NOTE — Discharge Instructions (Signed)
Follow up with family doctor for recheck of your toes and blood pressure

## 2017-01-25 ENCOUNTER — Encounter (HOSPITAL_COMMUNITY): Payer: Self-pay

## 2017-01-25 ENCOUNTER — Emergency Department (HOSPITAL_COMMUNITY)
Admission: EM | Admit: 2017-01-25 | Discharge: 2017-01-25 | Disposition: A | Discharge status: Home / Self Care | Payer: Medicare Other | Attending: Emergency Medicine | Admitting: Emergency Medicine

## 2017-01-25 ENCOUNTER — Emergency Department (HOSPITAL_COMMUNITY): Payer: Medicare Other

## 2017-01-25 ENCOUNTER — Encounter (HOSPITAL_COMMUNITY): Payer: Self-pay | Admitting: Emergency Medicine

## 2017-01-25 DIAGNOSIS — I509 Heart failure, unspecified: Secondary | ICD-10-CM | POA: Insufficient documentation

## 2017-01-25 DIAGNOSIS — I10 Essential (primary) hypertension: Secondary | ICD-10-CM | POA: Insufficient documentation

## 2017-01-25 DIAGNOSIS — I110 Hypertensive heart disease with heart failure: Secondary | ICD-10-CM | POA: Insufficient documentation

## 2017-01-25 DIAGNOSIS — Z79899 Other long term (current) drug therapy: Secondary | ICD-10-CM | POA: Insufficient documentation

## 2017-01-25 DIAGNOSIS — E119 Type 2 diabetes mellitus without complications: Secondary | ICD-10-CM | POA: Insufficient documentation

## 2017-01-25 DIAGNOSIS — R0789 Other chest pain: Secondary | ICD-10-CM | POA: Insufficient documentation

## 2017-01-25 DIAGNOSIS — M79674 Pain in right toe(s): Secondary | ICD-10-CM

## 2017-01-25 DIAGNOSIS — F17210 Nicotine dependence, cigarettes, uncomplicated: Secondary | ICD-10-CM | POA: Insufficient documentation

## 2017-01-25 LAB — I-STAT TROPONIN, ED
TROPONIN I, POC: 0.02 ng/mL (ref 0.00–0.08)
Troponin i, poc: 0 ng/mL (ref 0.00–0.08)

## 2017-01-25 LAB — CBC WITH DIFFERENTIAL/PLATELET
BASOS ABS: 0 10*3/uL (ref 0.0–0.1)
Basophils Relative: 0 %
Eosinophils Absolute: 0.1 10*3/uL (ref 0.0–0.7)
Eosinophils Relative: 1 %
HEMATOCRIT: 35 % — AB (ref 39.0–52.0)
HEMOGLOBIN: 10.8 g/dL — AB (ref 13.0–17.0)
LYMPHS ABS: 1 10*3/uL (ref 0.7–4.0)
LYMPHS PCT: 16 %
MCH: 27.6 pg (ref 26.0–34.0)
MCHC: 30.9 g/dL (ref 30.0–36.0)
MCV: 89.3 fL (ref 78.0–100.0)
Monocytes Absolute: 0.5 10*3/uL (ref 0.1–1.0)
Monocytes Relative: 9 %
NEUTROS ABS: 4.7 10*3/uL (ref 1.7–7.7)
NEUTROS PCT: 74 %
PLATELETS: 182 10*3/uL (ref 150–400)
RBC: 3.92 MIL/uL — AB (ref 4.22–5.81)
RDW: 14.5 % (ref 11.5–15.5)
WBC: 6.3 10*3/uL (ref 4.0–10.5)

## 2017-01-25 LAB — BASIC METABOLIC PANEL
ANION GAP: 7 (ref 5–15)
BUN: 13 mg/dL (ref 6–20)
CHLORIDE: 108 mmol/L (ref 101–111)
CO2: 25 mmol/L (ref 22–32)
Calcium: 8.9 mg/dL (ref 8.9–10.3)
Creatinine, Ser: 0.8 mg/dL (ref 0.61–1.24)
GFR calc Af Amer: >60 mL/min (ref >60)
GLUCOSE: 129 mg/dL — AB (ref 65–99)
POTASSIUM: 3.7 mmol/L (ref 3.5–5.1)
Sodium: 140 mmol/L (ref 135–145)

## 2017-01-25 LAB — BRAIN NATRIURETIC PEPTIDE: B NATRIURETIC PEPTIDE 5: 111.8 pg/mL — AB (ref 0.0–100.0)

## 2017-01-25 MED ORDER — ACETAMINOPHEN 325 MG PO TABS
650.0000 mg | ORAL_TABLET | Freq: Once | ORAL | Status: AC
  Administered 2017-01-25: 650 mg via ORAL
  Filled 2017-01-25: qty 2

## 2017-01-25 MED ORDER — ASPIRIN 81 MG PO CHEW
324.0000 mg | CHEWABLE_TABLET | Freq: Once | ORAL | Status: DC
  Filled 2017-01-25: qty 4

## 2017-01-25 MED ORDER — HYDROCODONE-ACETAMINOPHEN 5-325 MG PO TABS
1.0000 | ORAL_TABLET | Freq: Once | ORAL | Status: DC
  Filled 2017-01-25: qty 1

## 2017-01-25 NOTE — ED Notes (Signed)
Pt states he's not in pain and "don't take aspirin." Demanding to be unhooked from monitoring. RN redirected pt that we need to monitor him while he's in our care. Tech drawing repeat istat troponin at this time. EDP made aware.

## 2017-01-25 NOTE — ED Notes (Signed)
PIV removed from left forearm.

## 2017-01-25 NOTE — ED Triage Notes (Signed)
Per EMS- [employer] called EMS out, after pt was confronted for trespassing. [employer] called EMS reporting the pt was stating he had chest pain, but upon assessment with EMS pt denies having chest pain. Pt remains in purple scrubs from recent visit to ER. Pt states unable to get his meds until tomorrow. 166/90 BP. Psych hx, pt confused but unsure of baseline. HR 84 R 20 CBG 163 94% on room air.

## 2017-01-25 NOTE — Discharge Instructions (Signed)
Please follow with your primary care doctor in the next 2 days for a check-up. They must obtain records for further management.  ° °Do not hesitate to return to the Emergency Department for any new, worsening or concerning symptoms.  ° °

## 2017-01-25 NOTE — ED Provider Notes (Signed)
[facility]-EMERGENCY DEPT Provider Note   CSN: [id number] Arrival date & time: 01/25/17  1014     History   Chief Complaint Chief Complaint  Patient presents with  . Chest Pain     HPI  Blood pressure (!) 173/89, pulse 80, temperature 98.3 F (36.8 C), temperature source Oral, resp. rate 18, SpO2 95 %.  Mellody MemosFrederick L Cooley is a 56 y.o. male complaining of with past medical history significant for cocaine abuse, hypertension, CHF, schizophrenia who after he was being arrested for trespassing told the police he had chest pain. He told triage that he was not having active chest pain however he tells me that he is. He feels that he has pain "in the center of his body." He denies abdominal pain, SOB, cough, recent cocaine use, diaphoresis, syncope, palpitations, nausea or vomiting.  Past Medical History:  Diagnosis Date  . CHF (congestive heart failure) (HCC)   . Chronic foot pain   . Cocaine abuse   . Diabetes mellitus without complication (HCC)   . Hepatitis C   . Homelessness   . Homelessness   . Hypertension   . Neuropathy   . Polysubstance abuse   . Schizophrenia [facility](HCC)     Patient Active Problem List   Diagnosis Date Noted  . Chronic foot pain   . Schizoaffective disorder, bipolar type (HCC) 09/30/2016  . Suicidal thoughts   . Substance induced mood disorder (HCC) 03/13/2015  . Acute kidney failure (HCC) 01/26/2015  . Schizophrenia, paranoid type (HCC) 01/17/2015  . Suicidal ideation   . Drug hallucinosis (HCC) 10/08/2014  . Chronic paranoid schizophrenia (HCC) 09/07/2014  . 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 Acute CHF (congestive heart failure) (HCC) 07/29/2014  . Cocaine use disorder, severe, dependence (HCC)   . Essential hypertension, benign 03/28/2013  . Diabetes mellitus (HCC) 03/15/2013    No past surgical history on file.     Home Medications    Prior to Admission medications    Medication Sig Start Date End Date Taking? Authorizing Provider  amLODipine (NORVASC) 10 MG tablet Take 1 tablet (10 mg total) by mouth daily. 10/05/16   Adonis BrookAgustin, Sheila, NP  buPROPion (WELLBUTRIN) 75 MG tablet Take 1 tablet (75 mg total) by mouth daily. 10/05/16   Adonis BrookAgustin, Sheila, NP  hydrOXYzine (ATARAX/VISTARIL) 25 MG tablet Take 1 tablet (25 mg total) by mouth every 6 (six) hours as needed for anxiety. 10/04/16   Adonis BrookAgustin, Sheila, NP  lamoTRIgine (LAMICTAL) 25 MG tablet Take 1 tablet (25 mg total) by mouth daily. 10/05/16   Adonis BrookAgustin, Sheila, NP  nicotine (NICODERM CQ - DOSED IN MG/24 HOURS) 14 mg/24hr patch Place 1 patch (14 mg total) onto the skin daily. 10/05/16   Adonis BrookAgustin, Sheila, NP  OLANZapine (ZYPREXA) 7.5 MG tablet Take 1 tablet (7.5 mg total) by mouth at bedtime. 10/04/16   Adonis BrookAgustin, Sheila, NP  traZODone (DESYREL) 50 MG tablet Take 1 tablet (50 mg total) by mouth at bedtime. 10/04/16   Adonis BrookAgustin, Sheila, NP    Family History Family History  Problem Relation Age of Onset  . Hypertension Other   . Diabetes Other     Social History Social History  Substance Use Topics  . Smoking status: Current Every Day Smoker    Packs/day: 1.00    Years: 20.00    Types: Cigarettes  . Smokeless tobacco: Current User  . Alcohol use Yes     Comment: Daily Drinker  Allergies   Haldol [haloperidol]   Review of Systems Review of Systems  A complete review of systems was obtained and all systems are negative except as noted in the HPI and PMH.   Physical Exam Updated Vital Signs BP (!) 159/93   Pulse 66   Temp 98.3 F (36.8 C) (Oral)   Resp 12   SpO2 93%   Physical Exam  Constitutional: He is oriented to person, place, and time. He appears well-developed and well-nourished. No distress.  HENT:  Head: Normocephalic.  Mouth/Throat: Oropharynx is clear and moist.  Eyes: Conjunctivae are normal.  Neck: Normal range of motion. No JVD present. No tracheal deviation present.  Cardiovascular:  Normal rate, regular rhythm and intact distal pulses.   Radial pulse equal bilaterally  Pulmonary/Chest: Effort normal and breath sounds normal. No stridor. No respiratory distress. He has no wheezes. He has no rales. He exhibits no tenderness.  Abdominal: Soft. He exhibits no distension and no mass. There is no tenderness. There is no rebound and no guarding.  Musculoskeletal: Normal range of motion. He exhibits no edema or tenderness.  No calf asymmetry, superficial collaterals, palpable cords, edema, Homans sign negative bilaterally.    Neurological: He is alert and oriented to person, place, and time.  Skin: Skin is warm. He is not diaphoretic.  Psychiatric: He has a normal mood and affect.  Nursing note and vitals reviewed.    ED Treatments / Results  Labs (all labs ordered are listed, but only abnormal results are displayed) Labs Reviewed  CBC WITH DIFFERENTIAL/PLATELET - Abnormal; Notable for the following:       Result Value   RBC 3.92 (*)    Hemoglobin 10.8 (*)    HCT 35.0 (*)    All other components within normal limits  BASIC METABOLIC PANEL - Abnormal; Notable for the following:    Glucose, Bld 129 (*)    All other components within normal limits  BRAIN NATRIURETIC PEPTIDE - Abnormal; Notable for the following:    B Natriuretic Peptide 111.8 (*)    All other components within normal limits  I-STAT TROPONIN, ED  I-STAT TROPONIN, ED    EKG  EKG Interpretation  Date/Time:  Saturday January 25 2017 10:16:44 EDT Ventricular Rate:  75 PR Interval:    QRS Duration: 86 QT Interval:  401 QTC Calculation: 448 R Axis:   8 Text Interpretation:  Sinus rhythm Atrial premature complex Anterior infarct, old Abnormal T, consider ischemia, lateral leads flipped t waves seen on prior ecg 23 Jul 1999\ Otherwise no significant change Confirmed by Melene PlanFloyd, Dan [phone]([id number]) on 01/25/2017 10:25:39 AM       Radiology Dg Chest 2 View  Result Date: 01/25/2017 CLINICAL DATA:  Chest pain,  confusion, CHF, history of cocaine abuse and smoking EXAM: CHEST  2 VIEW COMPARISON:  07/28/2015 FINDINGS: Borderline cardiomegaly with slight central vascular congestion and bronchitic change. No definite CHF or focal pneumonia. Negative for edema, effusion or pneumothorax. Trachea is midline. Degenerative changes of the spine. Atherosclerosis of the aorta. IMPRESSION: Mild cardiomegaly with vascular congestion and chronic bronchitic change. No definite CHF or focal pneumonia Thoracic aortic atherosclerosis Electronically Signed   By: Judie PetitM.  Shick M.D.   On: 01/25/2017 11:15    Procedures Procedures (including critical care time)  Medications Ordered in ED Medications  aspirin chewable tablet 324 mg (324 mg Oral Refused 01/25/17 1347)  HYDROcodone-acetaminophen (NORCO/VICODIN) 5-325 MG per tablet 1 tablet (1 tablet Oral Refused 01/25/17 1347)  acetaminophen (TYLENOL) tablet 650  mg (not administered)     Initial Impression / Assessment and Plan / ED Course  I have reviewed the triage vital signs and the nursing notes.  Pertinent labs & imaging results that were available during my care of the patient were reviewed by me and considered in my medical decision making (see chart for details).     Vitals:   01/25/17 1017 01/25/17 1350 01/25/17 1400  BP: (!) 173/89 (!) 164/85 (!) 159/93  Pulse: 80 76 66  Resp: 18 19 12   Temp: 98.3 F (36.8 C)    TempSrc: Oral    SpO2: 95% 94% 93%    Medications  aspirin chewable tablet 324 mg (324 mg Oral Refused 01/25/17 1347)  HYDROcodone-acetaminophen (NORCO/VICODIN) 5-325 MG per tablet 1 tablet (1 tablet Oral Refused 01/25/17 1347)  acetaminophen (TYLENOL) tablet 650 mg (not administered)    DEVONE TOUSLEY is 56 y.o. male presenting with Chest pain. Patient refuses aspirin. EKG with no ischemic changes. Initial troponin negative, chest x-rays with no florid CHF. Pro BNP is not significantly elevated.  Patient does not appear to be floridly  psychotic, he has capacity for medical decision making, I think he can refuse his aspirin. His repeat troponin is negative. Patient is asking for discharge.   Evaluation does not show pathology that would require ongoing emergent intervention or inpatient treatment. Pt is hemodynamically stable and mentating appropriately. Discussed findings and plan with patient/guardian, who agrees with care plan. All questions answered. Return precautions discussed and outpatient follow up given.      Final Clinical Impressions(s) / ED Diagnoses   Final diagnoses:  Atypical chest pain    New Prescriptions New Prescriptions   No medications on file     Kaylyn Lim 01/25/17 1438    Melene Plan, DO 01/25/17 1522

## 2017-01-25 NOTE — ED Triage Notes (Signed)
BIB EMS, pt reports right great toe pain. Pt had toenail removed x1 week ago.

## 2017-01-25 NOTE — ED Provider Notes (Signed)
[facility]-EMERGENCY DEPT Provider Note   CSN: [id number] Arrival date & time: 01/25/17  0356     History   Chief Complaint Chief Complaint  Patient presents with  . Toe Pain    Right    HPI [name] is a 56 y.o. male.  56 year old male with a history of hypertension, schizophrenia, polysubstance abuse, and diabetes presents to the emergency department for right great toe pain. Patient reportedly had a toenail removed one week ago. He is sleeping on initial presentation and, upon waking, has no complaints. He denies any pain despite triage note referencing great toe pain. Patient in no acute distress. He has a history of homelessness. He was found wandering prior to arrival.      Past Medical History:  Diagnosis Date  . CHF (congestive heart failure) (HCC)   . Chronic foot pain   . Cocaine abuse   . Diabetes mellitus without complication (HCC)   . Hepatitis C   . Homelessness   . Homelessness   . Hypertension   . Neuropathy   . Polysubstance abuse   . Schizophrenia [facility](HCC)     Patient Active Problem List   Diagnosis Date Noted  . Chronic foot pain   . Schizoaffective disorder, bipolar type (HCC) 09/30/2016  . Suicidal thoughts   . Substance induced mood disorder (HCC) 03/13/2015  . Acute kidney failure (HCC) 01/26/2015  . Schizophrenia, paranoid type (HCC) 01/17/2015  . Suicidal ideation   . Drug hallucinosis (HCC) 10/08/2014  . Chronic paranoid schizophrenia (HCC) 09/07/2014  . 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 Acute CHF (congestive heart failure) (HCC) 07/29/2014  . Cocaine use disorder, severe, dependence (HCC)   . Essential hypertension, benign 03/28/2013  . Diabetes mellitus (HCC) 03/15/2013    History reviewed. No pertinent surgical history.     Home Medications    Prior to Admission medications   Medication Sig Start Date End Date Taking? Authorizing Provider    amLODipine (NORVASC) 10 MG tablet Take 1 tablet (10 mg total) by mouth daily. 10/05/16   Adonis BrookAgustin, Sheila, NP  buPROPion (WELLBUTRIN) 75 MG tablet Take 1 tablet (75 mg total) by mouth daily. 10/05/16   Adonis BrookAgustin, Sheila, NP  hydrOXYzine (ATARAX/VISTARIL) 25 MG tablet Take 1 tablet (25 mg total) by mouth every 6 (six) hours as needed for anxiety. 10/04/16   Adonis BrookAgustin, Sheila, NP  lamoTRIgine (LAMICTAL) 25 MG tablet Take 1 tablet (25 mg total) by mouth daily. 10/05/16   Adonis BrookAgustin, Sheila, NP  nicotine (NICODERM CQ - DOSED IN MG/24 HOURS) 14 mg/24hr patch Place 1 patch (14 mg total) onto the skin daily. 10/05/16   Adonis BrookAgustin, Sheila, NP  OLANZapine (ZYPREXA) 7.5 MG tablet Take 1 tablet (7.5 mg total) by mouth at bedtime. 10/04/16   Adonis BrookAgustin, Sheila, NP  traZODone (DESYREL) 50 MG tablet Take 1 tablet (50 mg total) by mouth at bedtime. 10/04/16   Adonis BrookAgustin, Sheila, NP    Family History Family History  Problem Relation Age of Onset  . Hypertension Other   . Diabetes Other     Social History Social History  Substance Use Topics  . Smoking status: Current Every Day Smoker    Packs/day: 1.00    Years: 20.00    Types: Cigarettes  . Smokeless tobacco: Current User  . Alcohol use Yes     Comment: Daily Drinker      Allergies   Haldol [haloperidol]   Review of Systems Review of Systems Ten  systems reviewed and are negative for acute change, except as noted in the HPI.    Physical Exam Updated Vital Signs BP (!) 151/88   Pulse 83   Temp 98.6 F (37 C) (Oral)   Resp 16   SpO2 94%   Physical Exam  Constitutional: He is oriented to person, place, and time. He appears well-developed and well-nourished. No distress.  Sleeping, in NAD on initial presentation. Easily awoken.  HENT:  Head: Normocephalic and atraumatic.  Eyes: Conjunctivae and EOM are normal. No scleral icterus.  Neck: Normal range of motion.  Pulmonary/Chest: Effort normal. No respiratory distress.  Respirations even and unlabored   Musculoskeletal: Normal range of motion.  No erythema or swelling to R great toe. Feet warm, good capillary refill.  Neurological: He is alert and oriented to person, place, and time.  Skin: Skin is warm and dry. No rash noted. He is not diaphoretic. No erythema. No pallor.  Psychiatric: He has a normal mood and affect. His behavior is normal.  Nursing note and vitals reviewed.    ED Treatments / Results  Labs (all labs ordered are listed, but only abnormal results are displayed) Labs Reviewed - No data to display  EKG  EKG Interpretation None       Radiology No results found.  Procedures Procedures (including critical care time)  Medications Ordered in ED Medications - No data to display   Initial Impression / Assessment and Plan / ED Course  I have reviewed the triage vital signs and the nursing notes.  Pertinent labs & imaging results that were available during my care of the patient were reviewed by me and considered in my medical decision making (see chart for details).     56 year old male with history of hypertension, schizophrenia, and polysubstance abuse presents to the emergency department for right great toe pain. During my encounter with him, he denies any complaints of pain. He is neurovascularly intact. Right great toe appears appropriate without secondary infection, abscess, cellulitis. I do not believe further emergent workup is indicated. Patient discharged in stable condition.   Final Clinical Impressions(s) / ED Diagnoses   Final diagnoses:  Toe pain, right    New Prescriptions New Prescriptions   No medications on file     Antony MaduraHumes, Atticus Lemberger, Cordelia Poche-C 01/25/17 0612    Antony MaduraHumes, Faigy Stretch, PA-C 01/25/17 [id number]    Azalia Bilisampos, Kevin, MD 01/25/17 [phone]

## 2017-01-26 ENCOUNTER — Encounter (HOSPITAL_COMMUNITY): Payer: Self-pay | Admitting: Emergency Medicine

## 2017-01-26 DIAGNOSIS — I509 Heart failure, unspecified: Secondary | ICD-10-CM | POA: Insufficient documentation

## 2017-01-26 DIAGNOSIS — I110 Hypertensive heart disease with heart failure: Secondary | ICD-10-CM | POA: Insufficient documentation

## 2017-01-26 DIAGNOSIS — R4182 Altered mental status, unspecified: Secondary | ICD-10-CM | POA: Insufficient documentation

## 2017-01-26 DIAGNOSIS — F1410 Cocaine abuse, uncomplicated: Secondary | ICD-10-CM | POA: Insufficient documentation

## 2017-01-26 DIAGNOSIS — F17210 Nicotine dependence, cigarettes, uncomplicated: Secondary | ICD-10-CM | POA: Insufficient documentation

## 2017-01-26 NOTE — ED Triage Notes (Signed)
Patient states he is suicidal and needs to be treated. Requests that we get him a room somewhere as he is homeless and likely to harm himself if released. States "I'm gonna jump off a porch or something".

## 2017-01-27 ENCOUNTER — Emergency Department (HOSPITAL_COMMUNITY): Payer: Medicare Other

## 2017-01-27 ENCOUNTER — Emergency Department (HOSPITAL_COMMUNITY)
Admission: EM | Admit: 2017-01-27 | Discharge: 2017-01-28 | Disposition: A | Discharge status: Other Acute Inpatient Hospital | Payer: Medicare Other | Attending: Emergency Medicine | Admitting: Emergency Medicine

## 2017-01-27 DIAGNOSIS — R45851 Suicidal ideations: Secondary | ICD-10-CM

## 2017-01-27 DIAGNOSIS — F17210 Nicotine dependence, cigarettes, uncomplicated: Secondary | ICD-10-CM

## 2017-01-27 DIAGNOSIS — F1290 Cannabis use, unspecified, uncomplicated: Secondary | ICD-10-CM

## 2017-01-27 DIAGNOSIS — F1410 Cocaine abuse, uncomplicated: Secondary | ICD-10-CM

## 2017-01-27 DIAGNOSIS — F1420 Cocaine dependence, uncomplicated: Secondary | ICD-10-CM | POA: Diagnosis present

## 2017-01-27 LAB — URINALYSIS, ROUTINE W REFLEX MICROSCOPIC
BILIRUBIN URINE: NEGATIVE
Bacteria, UA: NONE SEEN
GLUCOSE, UA: NEGATIVE mg/dL
HGB URINE DIPSTICK: NEGATIVE
Ketones, ur: NEGATIVE mg/dL
LEUKOCYTES UA: NEGATIVE
NITRITE: NEGATIVE
PH: 6 (ref 5.0–8.0)
Protein, ur: 100 mg/dL — AB
SPECIFIC GRAVITY, URINE: 1.024 (ref 1.005–1.030)
Squamous Epithelial / LPF: NONE SEEN

## 2017-01-27 LAB — CBC
HCT: 35.5 % — ABNORMAL LOW (ref 39.0–52.0)
Hemoglobin: 11.2 g/dL — ABNORMAL LOW (ref 13.0–17.0)
MCH: 28.4 pg (ref 26.0–34.0)
MCHC: 31.5 g/dL (ref 30.0–36.0)
MCV: 89.9 fL (ref 78.0–100.0)
PLATELETS: 200 10*3/uL (ref 150–400)
RBC: 3.95 MIL/uL — ABNORMAL LOW (ref 4.22–5.81)
RDW: 14.8 % (ref 11.5–15.5)
WBC: 7.9 10*3/uL (ref 4.0–10.5)

## 2017-01-27 LAB — AMMONIA: Ammonia: 57 umol/L — ABNORMAL HIGH (ref 9–35)

## 2017-01-27 LAB — RAPID URINE DRUG SCREEN, HOSP PERFORMED
Amphetamines: NOT DETECTED
Barbiturates: NOT DETECTED
Benzodiazepines: NOT DETECTED
Cocaine: POSITIVE — AB
Opiates: NOT DETECTED
Tetrahydrocannabinol: NOT DETECTED

## 2017-01-27 LAB — COMPREHENSIVE METABOLIC PANEL
ALK PHOS: 114 U/L (ref 38–126)
ALT: 25 U/L (ref 17–63)
AST: 41 U/L (ref 15–41)
Albumin: 3.4 g/dL — ABNORMAL LOW (ref 3.5–5.0)
Anion gap: 7 (ref 5–15)
BILIRUBIN TOTAL: 0.4 mg/dL (ref 0.3–1.2)
BUN: 16 mg/dL (ref 6–20)
CO2: 23 mmol/L (ref 22–32)
CREATININE: 0.79 mg/dL (ref 0.61–1.24)
Calcium: 8.9 mg/dL (ref 8.9–10.3)
Chloride: 111 mmol/L (ref 101–111)
GFR calc Af Amer: >60 mL/min (ref >60)
Glucose, Bld: 121 mg/dL — ABNORMAL HIGH (ref 65–99)
POTASSIUM: 3.4 mmol/L — AB (ref 3.5–5.1)
Sodium: 141 mmol/L (ref 135–145)
TOTAL PROTEIN: 6.5 g/dL (ref 6.5–8.1)

## 2017-01-27 LAB — I-STAT ARTERIAL BLOOD GAS, ED
Bicarbonate: 25.2 mmol/L (ref 20.0–28.0)
O2 SAT: 95 %
PCO2 ART: 40.7 mmHg (ref 32.0–48.0)
PO2 ART: 76 mmHg — AB (ref 83.0–108.0)
Patient temperature: 98.6
TCO2: 26 mmol/L (ref 0–100)
pH, Arterial: 7.399 (ref 7.350–7.450)

## 2017-01-27 LAB — I-STAT TROPONIN, ED
TROPONIN I, POC: 0.03 ng/mL (ref 0.00–0.08)
TROPONIN I, POC: 0.02 ng/mL (ref 0.00–0.08)

## 2017-01-27 LAB — ACETAMINOPHEN LEVEL: Acetaminophen (Tylenol), Serum: <10 ug/mL — ABNORMAL LOW (ref 10–30)

## 2017-01-27 LAB — SALICYLATE LEVEL

## 2017-01-27 LAB — TSH: TSH: 0.487 u[IU]/mL (ref 0.350–4.500)

## 2017-01-27 LAB — ETHANOL

## 2017-01-27 MED ORDER — BUPROPION HCL 75 MG PO TABS
75.0000 mg | ORAL_TABLET | Freq: Every day | ORAL | Status: DC
  Administered 2017-01-27 – 2017-01-28 (×2): 75 mg via ORAL
  Filled 2017-01-27 (×2): qty 1

## 2017-01-27 MED ORDER — OLANZAPINE 5 MG PO TABS
7.5000 mg | ORAL_TABLET | Freq: Every day | ORAL | Status: DC
  Administered 2017-01-27: 7.5 mg via ORAL
  Filled 2017-01-27: qty 2

## 2017-01-27 MED ORDER — NICOTINE 14 MG/24HR TD PT24
14.0000 mg | MEDICATED_PATCH | Freq: Every day | TRANSDERMAL | Status: DC
  Administered 2017-01-27 – 2017-01-28 (×2): 14 mg via TRANSDERMAL
  Filled 2017-01-27 (×2): qty 1

## 2017-01-27 MED ORDER — AMLODIPINE BESYLATE 5 MG PO TABS
10.0000 mg | ORAL_TABLET | Freq: Every day | ORAL | Status: DC
  Administered 2017-01-27 – 2017-01-28 (×2): 10 mg via ORAL
  Filled 2017-01-27 (×2): qty 2

## 2017-01-27 MED ORDER — HYDROXYZINE HCL 25 MG PO TABS
25.0000 mg | ORAL_TABLET | Freq: Four times a day (QID) | ORAL | Status: DC | PRN
Start: 2017-01-27 — End: 2017-01-28
  Administered 2017-01-27: 25 mg via ORAL
  Filled 2017-01-27: qty 1

## 2017-01-27 MED ORDER — TRAZODONE HCL 50 MG PO TABS
50.0000 mg | ORAL_TABLET | Freq: Every day | ORAL | Status: DC
  Administered 2017-01-27: 50 mg via ORAL
  Filled 2017-01-27: qty 1

## 2017-01-27 MED ORDER — ACETAMINOPHEN 325 MG PO TABS
325.0000 mg | ORAL_TABLET | ORAL | Status: DC | PRN
  Administered 2017-01-27: 325 mg via ORAL
  Filled 2017-01-27: qty 1

## 2017-01-27 MED ORDER — LAMOTRIGINE 25 MG PO TABS
25.0000 mg | ORAL_TABLET | Freq: Every day | ORAL | Status: DC
  Administered 2017-01-27 – 2017-01-28 (×2): 25 mg via ORAL
  Filled 2017-01-27 (×3): qty 1

## 2017-01-27 NOTE — BH Assessment (Addendum)
Tele Assessment Note   [name] is an 56 y.o. male who [facility] due to SI. Pt stated to ED staff that he was thinking of jumping off a porch or bridge to kill himself. Pt denied a plan during this assessment. Pt sts he is depressed and needs help. Pt sts he is homeless and "can't take it no more." Pt denies HI, SHI and AVH. Pt tested positive for cocaine tonight in the ED and negative for alcohol and all other substances. Pt sts he uses cocaine daily in amounts that vary. Pt sts he uses cannabis and alcohol about once per month. Pt denid any suicide attempts in the past. Per pt hx, pt has been to jail over drug related charges. Pt denies any hx of physical aggression, hurting anyone or property damage. Pt has been IP on multiple occasions at a variety of hospitals. Pt was seeing [name] for medication management but sts he no longer is seen there. Pt sts he takes no psych meds currently. Pt sts he neither sleeps or eats regularly. Pt denies any hx of abuse. Pt denies sx of depression but appears depressed.   Pt was dressed in scrubs and sitting on his hospital bed. Pt was alert, uncooperative and irritable. Pt kept poor eye contact, spoke in a raspy, garbled manner and at a fast pace. Pt moved in a normal manner when moving. Pt moved restlessly throughout. Pt's thought process was coherent and relevant and judgement was impaired.  No indication of delusional thinking or response to internal stimuli. Pt's mood seemed depressed but not anxious and his blunted affect was congruent.  Pt was oriented x 4, to person, place, time and situation.   Diagnosis:  Schizoaffective D/O by hx; Cocaine Use D/O, Severe; Alcohol Use D/O, Mild; Cannabis Use D/O, Mild  Past Medical History:  Past Medical History:  Diagnosis Date  . CHF (congestive heart failure) (HCC)   . Chronic foot pain   . Cocaine abuse   . Diabetes mellitus without complication (HCC)   . Hepatitis C   .  Homelessness   . Homelessness   . Hypertension   . Neuropathy   . Polysubstance abuse   . Schizophrenia (HCC)     History reviewed. No pertinent surgical history.  Family History:  Family History  Problem Relation Age of Onset  . Hypertension Other   . Diabetes Other     Social History:  reports that he has been smoking Cigarettes.  He has a 20.00 pack-year smoking history. He uses smokeless tobacco. He reports that he drinks alcohol. He reports that he uses drugs, including "Crack" cocaine, Cocaine, and Marijuana, about 7 times per week.  Additional Social History:  Alcohol / Drug Use Prescriptions: SEE MAR History of alcohol / drug use?: Yes Longest period of sobriety (when/how long): UNKNOWN Substance #1 Name of Substance 1: COCAINE 1 - Age of First Use: 15 1 - Amount (size/oz): VARIES 1 - Frequency: DAILY 1 - Duration: ONGOING 1 - Last Use / Amount: PTA IN ED TONIGHT Substance #2 Name of Substance 2: CANNABIS 2 - Age of First Use: UNKNOWN 2 - Amount (size/oz): UNKNOWN 2 - Frequency: 1 X MO 2 - Duration: ONGOING 2 - Last Use / Amount: 1 MO AGO Substance #3 Name of Substance 3: ALCOHOL 3 - Age of First Use: 15 3 - Amount (size/oz): VARIES 3 - Frequency: 1 X MO 3 - Duration: ONGOING 3 - Last Use / Amount:  1 MONTH AGO Substance #4 Name of Substance 4: NICOTINE/CIGARETTES 4 - Age of First Use: 15 4 - Amount (size/oz): 1/2 PACK 4 - Frequency: DAILY 4 - Duration: ONGOING 4 - Last Use / Amount: 01/26/17  CIWA: CIWA-Ar BP: (!) 144/76 Pulse Rate: 90 COWS:    PATIENT STRENGTHS: (choose at least two) Average or above average intelligence  Allergies:  Allergies  Allergen Reactions  . Haldol [Haloperidol] Other (See Comments)    Muscle spasms, loss of voluntary movement. However, pt has taken Thorazine on multiple occasions with no adverse effects.     Home Medications:  (Not in a hospital admission)  OB/GYN Status:  No LMP for male patient.  General  Assessment Data Location of Assessment: [facility] ED TTS Assessment: In system Is this a Tele or Face-to-Face Assessment?: Tele Assessment Is this an Initial Assessment or a Re-assessment for this encounter?: Initial Assessment Marital status: Divorced Living Arrangements: Other (Comment) (HOMELESS) Can pt return to current living arrangement?: Yes Admission Status: Voluntary Is patient capable of signing voluntary admission?: Yes Referral Source: Self/Family/Friend Insurance type:  (MEDICARE)     Crisis Care Plan Living Arrangements: Other (Comment) (HOMELESS) Name of Psychiatrist:  (NONE) Name of Therapist:  (NONE)  Education Status Is patient currently in school?: No Highest grade of school patient has completed:  (UNKNOWN)  Risk to self with the past 6 months Suicidal Ideation: Yes-Currently Present Has patient been a risk to self within the past 6 months prior to admission? : Yes Suicidal Intent: No Has patient had any suicidal intent within the past 6 months prior to admission? : Yes Is patient at risk for suicide?: Yes Suicidal Plan?: No Has patient had any suicidal plan within the past 6 months prior to admission? : Yes Access to Means: No (STS NO ACCESS TO GUNS) What has been your use of drugs/alcohol within the last 12 months?:  (DAILY USE OF COCAINE PER PT) Previous Attempts/Gestures: No How many times?:  (0) Other Self Harm Risks:  (NONE REPORTED) Triggers for Past Attempts: None known Intentional Self Injurious Behavior: None Family Suicide History: Unknown Recent stressful life event(s): Financial Problems, Other (Comment) (HOMELESSNESS) Persecutory voices/beliefs?: No Depression: Yes Depression Symptoms: Insomnia, Loss of interest in usual pleasures, Feeling worthless/self pity, Feeling angry/irritable Substance abuse history and/or treatment for substance abuse?: Yes Suicide prevention information given to non-admitted patients: Not applicable  Risk to Others  within the past 6 months Homicidal Ideation: No (DENIES) Does patient have any lifetime risk of violence toward others beyond the six months prior to admission? : No (NONE REPORTED) Thoughts of Harm to Others:  (DENIES) Current Homicidal Intent: No Current Homicidal Plan: No Access to Homicidal Means: No Identified Victim:  (NONE) History of harm to others?: No Assessment of Violence: None Noted Violent Behavior Description:  (NA) Does patient have access to weapons?: No Criminal Charges Pending?:  (UTA) Does patient have a court date:  (UTA) Is patient on probation?:  (UTA)  Psychosis Hallucinations: None noted (DENIES) Delusions: None noted  Mental Status Report Appearance/Hygiene: Disheveled, Unremarkable, In scrubs Eye Contact: Poor Motor Activity: Freedom of movement, Restlessness Speech: Aggressive, Logical/coherent Level of Consciousness: Alert, Irritable Mood: Depressed, Labile, Irritable Affect: Blunted, Depressed, Irritable Anxiety Level: None Thought Processes: Coherent, Relevant Judgement: Impaired Orientation: Person, Place, Time, Situation Obsessive Compulsive Thoughts/Behaviors: None  Cognitive Functioning Concentration: Poor Memory: Unable to Assess IQ: Average Insight: Poor Impulse Control: Poor Appetite:  (UTA) Weight Loss:  (UTA) Weight Gain:  (UTA) Sleep: Decreased Total Hours  of Sleep:  (1) Vegetative Symptoms: None  ADLScreening [facility] Assessment Services) Patient's cognitive ability adequate to safely complete daily activities?: Yes Patient able to express need for assistance with ADLs?: Yes Independently performs ADLs?: Yes (appropriate for developmental age) (NO BARRIERS REPORTED)  Prior Inpatient Therapy Prior Inpatient Therapy: Yes Prior Therapy Dates:  (MULTIPLE) Prior Therapy Facilty/Provider(s):  (MULTIPLE) Reason for Treatment:  (SCHIZOAFFECTIVE D/O)  Prior Outpatient Therapy Prior Outpatient Therapy: Yes Prior Therapy Dates:   (UNKNOWN) Prior Therapy Facilty/Provider(s):  [name]) Reason for Treatment:  (SCHIZOAFFECTIVE D/O) Does patient have an ACCT team?: No Does patient have Intensive In-House Services?  : No Does patient have [name] services? : No Does patient have P4CC services?: No  ADL Screening (condition at time of admission) Patient's cognitive ability adequate to safely complete daily activities?: Yes Patient able to express need for assistance with ADLs?: Yes Independently performs ADLs?: Yes (appropriate for developmental age) (NO BARRIERS REPORTED)       Abuse/Neglect Assessment (Assessment to be complete while patient is alone) Physical Abuse: Denies Verbal Abuse: Denies Sexual Abuse: Denies Exploitation of patient/patient's resources: Denies Self-Neglect: Denies     Merchant navy officerAdvance Directives (For Healthcare) Does Patient Have a Medical Advance Directive?: No Would patient like information on creating a medical advance directive?: No - Patient declined    Additional Information 1:1 In Past 12 Months?: Yes CIRT Risk: Yes Elopement Risk: Yes Does patient have medical clearance?: Yes     Disposition:  Disposition Initial Assessment Completed for this Encounter: Yes Disposition of Patient: Other dispositions Other disposition(s): Other (Comment) (PENDING REVIEW W [facility] EXTENDER)  Observe overnight & re-evaluate pt during the morning per Nira ConnJason Berry, NP  Spoke with Dr. Manus Gunningancour, [facility] and advised of recommendation. He stated he agreed.   Beryle FlockMary Braidyn Scorsone, MS, CRC, [facility] T[facility] Specialist [facility] Deliyah Muckle T 01/27/2017 5:46 AM

## 2017-01-27 NOTE — Progress Notes (Signed)
Patient meets criteria for inpatient treatment. CSW faxed referrals to the following inpatient facilities for review:  [facility], [facility], [facility], [facility], [facility], [address], [facility], [facility], [facility], [facility]   TTS will continue to seek bed placement.   Baldo DaubJolan Susa Bones MSW, LCSWA CSW Disposition [phone]

## 2017-01-27 NOTE — ED Notes (Signed)
Breakfast tray ordered 

## 2017-01-27 NOTE — Consult Note (Signed)
[facility] Tele-Psychiatry Consult   Reason for Consult:  Suicidal ideation Referring Physician:  EDP Patient Identification: [name] MRN:  [id number] Principal Diagnosis: Cocaine use disorder, severe, dependence ([place]) Diagnosis:   Patient Active Problem List   Diagnosis Date Noted  . Chronic foot pain [M79.673, G89.29]   . Schizoaffective disorder, bipolar type ([name]) [F25.0] 09/30/2016  . Suicidal thoughts [R45.851]   . Substance induced mood disorder ([place]) [F19.94] 03/13/2015  . Acute kidney failure ([name]) [N17.9] 01/26/2015  . Schizophrenia, paranoid type ([name]) [F20.0] 01/17/2015  . Suicidal ideation [R45.851]   . Drug hallucinosis ([name]) [F19.951] 10/08/2014  . Chronic paranoid schizophrenia ( [place]) [F20.0] 09/07/2014  . Substance or medication-induced bipolar and related disorder with onset during intoxication ([name]) [F19.94] 08/10/2014  . Acute CHF (congestive heart failure) ([place]) [I50.9] 07/29/2014  . Cocaine use disorder, severe, dependence ([name]) [F14.20]   . Cocaine abuse [F14.10] 03/10/2014  . Essential hypertension, benign [I10] 03/28/2013  . Diabetes mellitus ([name]) [E11.9] 03/15/2013    Total Time spent with patient: 20 minutes  Subjective:   [name] is a 56 y.o. male patient presented to [facility] with complaints of suicidal ideation.  HPI:    Per initial [place] Assessment:   [name] is an 56 y.o. male who came to the [facility] voluntarily due to [name]. Pt stated to ED staff that he was thinking of jumping off a porch or bridge to kill himself. Pt denied a plan during this assessment. Pt sts he is depressed and needs help. Pt sts he is homeless and "can't take it no more." Pt denies HI, SHI and AVH. Pt tested positive for cocaine tonight in the ED and negative for alcohol and all other substances. Pt sts he uses cocaine daily in amounts that vary. Pt sts he uses cannabis and alcohol about once per month. Pt denid any suicide attempts in the past. Per  pt hx, pt has been to jail over drug related charges. Pt denies any hx of physical aggression, hurting anyone or property damage. Pt has been IP on multiple occasions at a variety of hospitals. Pt was seeing [name] for medication management but sts he no longer is seen there. Pt sts he takes no psych meds currently. Pt sts he neither sleeps or eats regularly. Pt denies any hx of abuse. Pt denies sx of depression but appears depressed.     The patient was recently seen by Dr. Louretta Shorten for a face to face assessment on 01/18/2017. At that time he was found appropriate for discharge. Patient then again represented with similar complaints per record. He wanted help with his sore throat and getting into a group home. Reports that he was in prison for 15 months and just got out 2 days ago.  States that he is staying with a cousin but the neighborhood is bad and that if he stays there he will be on drugs again. Patient was most recently inpatient at [facility] in April of 2018 and was re-started on his medications of zyprexa and wellbutrin. During assessment today 01/27/3017 [name] denies that he has been taking medications or going to his follow up appointments. The patient became very agitated when it was mentioned by this writer that his urine was positive for cocaine stating "I know that. That's all you people care about. I need a place to go! That's right I had suicidal thoughts because I have nowhere to go. I could run into traffic anytime. I have nothing out there. You  people do not want to help me!" At that point the patient became very difficult to assess due to his level of agitation. His chronic suicidal ideation appears to be related to psychosocial stressors. When asked if he has recently attempted to harm himself the patient replied "no." At this time the patient is unable to contract for his safety and does not appear psychiatrically stable. Patient spoke in a very low tone of voice and appeared  sedated. The patient appears to be at risk for impulsive behaviors that could result in serious self harm.    Past Psychiatric History: Cocaine dependence/abuse, Substance induced mood disorder, Schizophrenia  Risk to Self: Suicidal Ideation: Yes-Currently Present Suicidal Intent: At times endorses intent  Is patient at risk for suicide?: Yes Suicidal Plan?: No Access to Means: No (STS NO ACCESS TO GUNS) What has been your use of drugs/alcohol within the last 12 months?:  (DAILY USE OF COCAINE PER PT) How many times?:  (0) Other Self Harm Risks:  (NONE REPORTED) Triggers for Past Attempts: None known Intentional Self Injurious Behavior: None Risk to Others: Homicidal Ideation: No (DENIES) Thoughts of Harm to Others:  (DENIES) Current Homicidal Intent: No Current Homicidal Plan: No Access to Homicidal Means: No Identified Victim:  (NONE) History of harm to others?: No Assessment of Violence: None Noted Violent Behavior Description:  (NA) Does patient have access to weapons?: No Criminal Charges Pending?:  (UTA) Does patient have a court date:  Special educational needs teacher) Prior Inpatient Therapy: Prior Inpatient Therapy: Yes Prior Therapy Dates:  (MULTIPLE) Prior Therapy Facilty/Provider(s):  (MULTIPLE) Reason for Treatment:  (SCHIZOAFFECTIVE D/O) Prior Outpatient Therapy: Prior Outpatient Therapy: Yes Prior Therapy Dates:  (UNKNOWN) Prior Therapy Facilty/Provider(s):  [facility]) Reason for Treatment:  (SCHIZOAFFECTIVE D/O) Does patient have an ACCT team?: No Does patient have Intensive [name] Services?  : No Does patient have [name] services? : No Does patient have [facility] services?: No  Past Medical History:  Past Medical History:  Diagnosis Date  . CHF (congestive heart failure) ([name])   . Chronic foot pain   . Cocaine abuse   . Diabetes mellitus without complication ([name])   . Hepatitis C   . Homelessness   . Homelessness   . Hypertension   . Neuropathy   . Polysubstance abuse   .  Schizophrenia ([name])    History reviewed. No pertinent surgical history. Family History:  Family History  Problem Relation Age of Onset  . Hypertension Other   . Diabetes Other    Family Psychiatric  History: Unaware Social History:  History  Alcohol Use  . Yes    Comment: Daily Drinker      History  Drug Use  . Frequency: 7.0 times per week  . Types: "Crack" cocaine, Cocaine, Marijuana    Comment: Cocaine tonight, Marijuana "a long time"    Social History   Social History  . Marital status: Divorced    Spouse name: N/A  . Number of children: N/A  . Years of education: N/A   Social History Main Topics  . Smoking status: Current Every Day Smoker    Packs/day: 1.00    Years: 20.00    Types: Cigarettes  . Smokeless tobacco: Current User  . Alcohol use Yes     Comment: Daily Drinker   . Drug use: Yes    Frequency: 7.0 times per week    Types: "Crack" cocaine, Cocaine, Marijuana     Comment: Cocaine tonight, Marijuana "a long time"  . Sexual activity: No  Other Topics Concern  . None   Social History Narrative   ** Merged History Encounter **       Additional Social History:    Allergies:   Allergies  Allergen Reactions  . Haldol [Haloperidol] Other (See Comments)    Muscle spasms, loss of voluntary movement. However, pt has taken Thorazine on multiple occasions with no adverse effects.     Labs:  Results for orders placed or performed during the hospital encounter of 01/27/17 (from the past 48 hour(s))  Comprehensive metabolic panel     Status: Abnormal   Collection Time: 01/26/17 11:48 PM  Result Value Ref Range   Sodium 141 135 - 145 mmol/L   Potassium 3.4 (L) 3.5 - 5.1 mmol/L   Chloride 111 101 - 111 mmol/L   CO2 23 22 - 32 mmol/L   Glucose, Bld 121 (H) 65 - 99 mg/dL   BUN 16 6 - 20 mg/dL   Creatinine, Ser 0.79 0.61 - 1.24 mg/dL   Calcium 8.9 8.9 - 10.3 mg/dL   Total Protein 6.5 6.5 - 8.1 g/dL   Albumin 3.4 (L) 3.5 - 5.0 g/dL   AST 41 15 - 41  U/L   ALT 25 17 - 63 U/L   Alkaline Phosphatase 114 38 - 126 U/L   Total Bilirubin 0.4 0.3 - 1.2 mg/dL   GFR calc non Af Amer >60 >60 mL/min   GFR calc Af Amer >60 >60 mL/min    Comment: (NOTE) The eGFR has been calculated using the CKD EPI equation. This calculation has not been validated in all clinical situations. eGFR's persistently <60 mL/min signify possible Chronic Kidney Disease.    Anion gap 7 5 - 15  Ethanol     Status: None   Collection Time: 01/26/17 11:48 PM  Result Value Ref Range   Alcohol, Ethyl (B) <5 <5 mg/dL    Comment:        LOWEST DETECTABLE LIMIT FOR SERUM ALCOHOL IS 5 mg/dL FOR MEDICAL PURPOSES ONLY   Salicylate level     Status: None   Collection Time: 01/26/17 11:48 PM  Result Value Ref Range   Salicylate Lvl <1.7 2.8 - 30.0 mg/dL  Acetaminophen level     Status: Abnormal   Collection Time: 01/26/17 11:48 PM  Result Value Ref Range   Acetaminophen (Tylenol), Serum <10 (L) 10 - 30 ug/mL    Comment:        THERAPEUTIC CONCENTRATIONS VARY SIGNIFICANTLY. A RANGE OF 10-30 ug/mL MAY BE AN EFFECTIVE CONCENTRATION FOR MANY PATIENTS. HOWEVER, SOME ARE BEST TREATED AT CONCENTRATIONS OUTSIDE THIS RANGE. ACETAMINOPHEN CONCENTRATIONS >150 ug/mL AT 4 HOURS AFTER INGESTION AND >50 ug/mL AT 12 HOURS AFTER INGESTION ARE OFTEN ASSOCIATED WITH TOXIC REACTIONS.   cbc     Status: Abnormal   Collection Time: 01/26/17 11:48 PM  Result Value Ref Range   WBC 7.9 4.0 - 10.5 K/uL   RBC 3.95 (L) 4.22 - 5.81 MIL/uL   Hemoglobin 11.2 (L) 13.0 - 17.0 g/dL   HCT 35.5 (L) 39.0 - 52.0 %   MCV 89.9 78.0 - 100.0 fL   MCH 28.4 26.0 - 34.0 pg   MCHC 31.5 30.0 - 36.0 g/dL   RDW 14.8 11.5 - 15.5 %   Platelets 200 150 - 400 K/uL  I-Stat Arterial Blood Gas, ED - (order at [facility] and [facility] only)     Status: Abnormal   Collection Time: 01/27/17  2:22 AM  Result Value Ref Range   pH,  Arterial 7.399 7.350 - 7.450   pCO2 arterial 40.7 32.0 - 48.0 mmHg   pO2, Arterial 76.0 (L) 83.0  - 108.0 mmHg   Bicarbonate 25.2 20.0 - 28.0 mmol/L   TCO2 26 0 - 100 mmol/L   O2 Saturation 95.0 %   Patient temperature 98.6 F    Collection site RADIAL, ALLEN'S TEST ACCEPTABLE    Drawn by RT    Sample type ARTERIAL   Ammonia     Status: Abnormal   Collection Time: 01/27/17  2:34 AM  Result Value Ref Range   Ammonia 57 (H) 9 - 35 umol/L  TSH     Status: None   Collection Time: 01/27/17  2:34 AM  Result Value Ref Range   TSH 0.487 0.350 - 4.500 uIU/mL    Comment: Performed by a 3rd Generation assay with a functional sensitivity of <=0.01 uIU/mL.  I-stat troponin, ED     Status: None   Collection Time: 01/27/17  2:45 AM  Result Value Ref Range   Troponin i, poc 0.03 0.00 - 0.08 ng/mL   Comment 3            Comment: Due to the release kinetics of cTnI, a negative result within the first hours of the onset of symptoms does not rule out myocardial infarction with certainty. If myocardial infarction is still suspected, repeat the test at appropriate intervals.   Rapid urine drug screen (hospital performed)     Status: Abnormal   Collection Time: 01/27/17  3:45 AM  Result Value Ref Range   Opiates NONE DETECTED NONE DETECTED   Cocaine POSITIVE (A) NONE DETECTED   Benzodiazepines NONE DETECTED NONE DETECTED   Amphetamines NONE DETECTED NONE DETECTED   Tetrahydrocannabinol NONE DETECTED NONE DETECTED   Barbiturates NONE DETECTED NONE DETECTED    Comment:        DRUG SCREEN FOR MEDICAL PURPOSES ONLY.  IF CONFIRMATION IS NEEDED FOR ANY PURPOSE, NOTIFY LAB WITHIN 5 DAYS.        LOWEST DETECTABLE LIMITS FOR URINE DRUG SCREEN Drug Class       Cutoff (ng/mL) Amphetamine      1000 Barbiturate      200 Benzodiazepine   779 Tricyclics       390 Opiates          300 Cocaine          300 THC              50   Urinalysis, Routine w reflex microscopic     Status: Abnormal   Collection Time: 01/27/17  3:45 AM  Result Value Ref Range   Color, Urine YELLOW YELLOW   APPearance  CLEAR CLEAR   Specific Gravity, Urine 1.024 1.005 - 1.030   pH 6.0 5.0 - 8.0   Glucose, UA NEGATIVE NEGATIVE mg/dL   Hgb urine dipstick NEGATIVE NEGATIVE   Bilirubin Urine NEGATIVE NEGATIVE   Ketones, ur NEGATIVE NEGATIVE mg/dL   Protein, ur 100 (A) NEGATIVE mg/dL   Nitrite NEGATIVE NEGATIVE   Leukocytes, UA NEGATIVE NEGATIVE   RBC / HPF 0-5 0 - 5 RBC/hpf   WBC, UA 0-5 0 - 5 WBC/hpf   Bacteria, UA NONE SEEN NONE SEEN   Squamous Epithelial / LPF NONE SEEN NONE SEEN   Mucous PRESENT   I-stat troponin, ED     Status: None   Collection Time: 01/27/17  6:33 AM  Result Value Ref Range   Troponin i, poc 0.02 0.00 - 0.08 ng/mL  Comment 3            Comment: Due to the release kinetics of cTnI, a negative result within the first hours of the onset of symptoms does not rule out myocardial infarction with certainty. If myocardial infarction is still suspected, repeat the test at appropriate intervals.     Current Facility-Administered Medications  Medication Dose Route Frequency Provider Last Rate Last Dose  . acetaminophen (TYLENOL) tablet 325 mg  325 mg Oral Q4H PRN Isla Pence, MD   325 mg at 01/27/17 0801  . amLODipine (NORVASC) tablet 10 mg  10 mg Oral Daily Rancour, Stephen, MD      . buPROPion [facility]) tablet 75 mg  75 mg Oral Daily Rancour, Stephen, MD      . hydrOXYzine (ATARAX/VISTARIL) tablet 25 mg  25 mg Oral Q6H PRN Ezequiel Essex, MD   25 mg at 01/27/17 0801  . lamoTRIgine (LAMICTAL) tablet 25 mg  25 mg Oral Daily Rancour, Stephen, MD      . nicotine (NICODERM CQ - dosed in mg/24 hours) patch 14 mg  14 mg Transdermal Daily Rancour, Stephen, MD      . OLANZapine (ZYPREXA) tablet 7.5 mg  7.5 mg Oral QHS Rancour, Stephen, MD      . traZODone (DESYREL) tablet 50 mg  50 mg Oral QHS Rancour, Annie Main, MD       Current Outpatient Prescriptions  Medication Sig Dispense Refill  . amLODipine (NORVASC) 10 MG tablet Take 1 tablet (10 mg total) by mouth daily. 30 tablet 0   . buPROPion (WELLBUTRIN) 75 MG tablet Take 1 tablet (75 mg total) by mouth daily. 30 tablet 0  . hydrOXYzine (ATARAX/VISTARIL) 25 MG tablet Take 1 tablet (25 mg total) by mouth every 6 (six) hours as needed for anxiety. 30 tablet 0  . lamoTRIgine (LAMICTAL) 25 MG tablet Take 1 tablet (25 mg total) by mouth daily. 30 tablet 0  . nicotine (NICODERM CQ - DOSED IN MG/24 HOURS) 14 mg/24hr patch Place 1 patch (14 mg total) onto the skin daily. 28 patch 0  . OLANZapine (ZYPREXA) 7.5 MG tablet Take 1 tablet (7.5 mg total) by mouth at bedtime. 30 tablet 0  . traZODone (DESYREL) 50 MG tablet Take 1 tablet (50 mg total) by mouth at bedtime. 30 tablet 0    Musculoskeletal:  Unable to assess via camera   Psychiatric Specialty Exam: Physical Exam  Nursing note and vitals reviewed. Constitutional: He is oriented to person, place, and time.  Neck: Normal range of motion.  Respiratory: Effort normal.  Neurological: He is alert and oriented to person, place, and time.  Psychiatric: He has a normal mood and affect. His speech is normal and behavior is normal. Thought content is not delusional. Cognition and memory are normal. He expresses impulsivity.    Review of Systems  Psychiatric/Behavioral: Positive for depression, substance abuse and suicidal ideas. Negative for hallucinations and memory loss. The patient is nervous/anxious. The patient does not have insomnia.   All other systems reviewed and are negative.   Blood pressure (!) 142/78, pulse 85, temperature 97.9 F (36.6 C), temperature source Axillary, resp. rate 16, SpO2 96 %.There is no height or weight on file to calculate BMI.  General Appearance: Fairly Groomed  Eye Contact:  Good  Speech:  Normal Rate and Raspy  Volume:  Increased  Mood:  Anxious  Affect:  Congruent  Thought Process:  Goal Directed  Orientation:  Full (Time, Place, and Person)  Thought Content:  Logical  Suicidal Thoughts:  Talks about "jumping into traffic" if I  can't get into a group home.  Homicidal Thoughts:  No  Memory:  Immediate;   Good Recent;   Good Remote;   Good  Judgement:  Fair  Insight:  Fair  Psychomotor Activity:  Normal  Concentration:  Concentration: Good and Attention Span: Good  Recall:  Good  Fund of Knowledge:  Fair  Language:  Good  Akathisia:  No  Handed:  Right  AIMS (if indicated):     Assets:  Communication Skills Desire for Improvement Intimacy Social Support  ADL's:  Intact  Cognition:  WNL  Sleep:        Treatment Plan Summary: Plan for inpatient admission; Continue current medications as listed above   Disposition: Recommend psychiatric Inpatient admission when medically cleared. Supportive therapy provided about ongoing stressors. Discussed crisis plan, support from social network, calling 911, coming to the Emergency Department, and calling Suicide Hotline.  Elmarie Shiley, NP 01/27/2017 9:53 AM

## 2017-01-27 NOTE — ED Notes (Signed)
Pt given specimen cup and notified emt.

## 2017-01-27 NOTE — ED Provider Notes (Signed)
[facility] DEPT Provider Note   CSN: [id number] Arrival date & time: 01/26/17  2321  By signing my name below, I, Rosana Fret, attest that this documentation has been prepared under the direction and in the presence of Marjean Imperato, Jeannett Senior, MD. Electronically Signed: Rosana Fret, ED Scribe. 01/27/17. 2:05 AM.  History   Chief Complaint Chief Complaint  Patient presents with  . Suicidal   The history is provided by the patient. No language interpreter was used.   LEVEL 5 CAVEAT DUE TO mental status change. Pt is sleepy, hard to arouse and will not stay awake to provide history.   HPI Comments: [name] is a 56 y.o. male   Patient very sleepy on assessment, will not stay awake to give a history. Reported was suicidal in triage with plan to jump from something.   Pt endorses cocaine use approximately 2 hours PTA.   Patient reports feeling suicidal. He is also homeless. States he has a plan to harm himself if he is released in place a jump off of a bridge. Denies any alcohol use tonight. Admits to using cocaine 2 hours prior to arrival.  Denies any chest pain, abdominal pain, nausea or vomiting. No fever or chills. Denies any homicidal thoughts or hallucinations.  Past Medical History:  Diagnosis Date  . CHF (congestive heart failure) (HCC)   . Chronic foot pain   . Cocaine abuse   . Diabetes mellitus without complication (HCC)   . Hepatitis C   . Homelessness   . Homelessness   . Hypertension   . Neuropathy   . Polysubstance abuse   . Schizophrenia [facility])     Patient Active Problem List   Diagnosis Date Noted  . Chronic foot pain   . Schizoaffective disorder, bipolar type (HCC) 09/30/2016  . Suicidal thoughts   . Substance induced mood disorder (HCC) 03/13/2015  . Acute kidney failure (HCC) 01/26/2015  . Schizophrenia, paranoid type (HCC) 01/17/2015  . Suicidal ideation   . Drug hallucinosis (HCC) 10/08/2014  . Chronic paranoid schizophrenia (HCC)  09/07/2014  . 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 Acute CHF (congestive heart failure) (HCC) 07/29/2014  . Cocaine use disorder, severe, dependence (HCC)   . Essential hypertension, benign 03/28/2013  . Diabetes mellitus (HCC) 03/15/2013    History reviewed. No pertinent surgical history.     Home Medications    Prior to Admission medications   Medication Sig Start Date End Date Taking? Authorizing Provider  amLODipine (NORVASC) 10 MG tablet Take 1 tablet (10 mg total) by mouth daily. 10/05/16  Yes Adonis Brook, NP  buPROPion (WELLBUTRIN) 75 MG tablet Take 1 tablet (75 mg total) by mouth daily. 10/05/16  Yes Adonis Brook, NP  hydrOXYzine (ATARAX/VISTARIL) 25 MG tablet Take 1 tablet (25 mg total) by mouth every 6 (six) hours as needed for anxiety. 10/04/16  Yes Adonis Brook, NP  lamoTRIgine (LAMICTAL) 25 MG tablet Take 1 tablet (25 mg total) by mouth daily. 10/05/16  Yes Adonis Brook, NP  nicotine (NICODERM CQ - DOSED IN MG/24 HOURS) 14 mg/24hr patch Place 1 patch (14 mg total) onto the skin daily. 10/05/16  Yes Adonis Brook, NP  OLANZapine (ZYPREXA) 7.5 MG tablet Take 1 tablet (7.5 mg total) by mouth at bedtime. 10/04/16  Yes Adonis Brook, NP  traZODone (DESYREL) 50 MG tablet Take 1 tablet (50 mg total) by mouth at bedtime. 10/04/16  Yes Adonis Brook, NP    Family History Family  History  Problem Relation Age of Onset  . Hypertension Other   . Diabetes Other     Social History Social History  Substance Use Topics  . Smoking status: Current Every Day Smoker    Packs/day: 1.00    Years: 20.00    Types: Cigarettes  . Smokeless tobacco: Current User  . Alcohol use Yes     Comment: Daily Drinker      Allergies   Haldol [haloperidol]   Review of Systems Review of Systems LEVEL 5 CAVEAT DUE TO mental status change.   Physical Exam Updated Vital Signs BP (!) 166/113 (BP Location: Left  Arm)   Pulse 82   Temp 98.4 F (36.9 C) (Oral)   Resp 18   SpO2 93%   Physical Exam  Constitutional: He is oriented to person, place, and time. He appears well-developed and well-nourished. No distress.  Sleeping, difficult to stay awake. Answers questions appropriately.  HENT:  Head: Normocephalic and atraumatic.  Mouth/Throat: Oropharynx is clear and moist. No oropharyngeal exudate.  Eyes: Pupils are equal, round, and reactive to light. Conjunctivae and EOM are normal.  Neck: Normal range of motion. Neck supple.  No meningismus.  Cardiovascular: Normal rate, regular rhythm, normal heart sounds and intact distal pulses.   No murmur heard. Pulmonary/Chest: Effort normal and breath sounds normal. No respiratory distress.  Abdominal: Soft. There is no tenderness. There is no rebound and no guarding.  Musculoskeletal: Normal range of motion. He exhibits no edema or tenderness.  Neurological: He is alert and oriented to person, place, and time. No cranial nerve deficit. He exhibits normal muscle tone. Coordination normal.  No pronator drift. 5/5 strength throughout. CN 2-12 intact. Equal grip strength. Sensation intact. No ankle clonus.   Skin: Skin is warm. Capillary refill takes less than 2 seconds.  Psychiatric: He has a normal mood and affect. His behavior is normal.  Nursing note and vitals reviewed.    ED Treatments / Results  DIAGNOSTIC STUDIES: Oxygen Saturation is 93% on RA, adequate by my interpretation.   Labs (all labs ordered are listed, but only abnormal results are displayed) Labs Reviewed  COMPREHENSIVE METABOLIC PANEL - Abnormal; Notable for the following:       Result Value   Potassium 3.4 (*)    Glucose, Bld 121 (*)    Albumin 3.4 (*)    All other components within normal limits  ACETAMINOPHEN LEVEL - Abnormal; Notable for the following:    Acetaminophen (Tylenol), Serum <10 (*)    All other components within normal limits  CBC - Abnormal; Notable for  the following:    RBC 3.95 (*)    Hemoglobin 11.2 (*)    HCT 35.5 (*)    All other components within normal limits  RAPID URINE DRUG SCREEN, HOSP PERFORMED - Abnormal; Notable for the following:    Cocaine POSITIVE (*)    All other components within normal limits  URINALYSIS, ROUTINE W REFLEX MICROSCOPIC - Abnormal; Notable for the following:    Protein, ur 100 (*)    All other components within normal limits  AMMONIA - Abnormal; Notable for the following:    Ammonia 57 (*)    All other components within normal limits  I-STAT ARTERIAL BLOOD GAS, ED - Abnormal; Notable for the following:    pO2, Arterial 76.0 (*)    All other components within normal limits  ETHANOL  SALICYLATE LEVEL  TSH  I-STAT TROPONIN, ED  I-STAT TROPONIN, ED    EKG  EKG Interpretation  Date/Time:  Monday January 27 2017 02:32:42 EDT Ventricular Rate:  82 PR Interval:    QRS Duration: 86 QT Interval:  390 QTC Calculation: 456 R Axis:   42 Text Interpretation:  Sinus rhythm Anterior infarct, old Nonspecific T abnormalities, lateral leads Baseline wander in lead(s) V3 V4 No significant change was found Confirmed by Glynn Octaveancour, Zeniya Lapidus [phone]([id number]) on 01/27/2017 2:45:38 AM       Radiology Dg Chest 2 View  Result Date: 01/25/2017 CLINICAL DATA:  Chest pain, confusion, CHF, history of cocaine abuse and smoking EXAM: CHEST  2 VIEW COMPARISON:  07/28/2015 FINDINGS: Borderline cardiomegaly with slight central vascular congestion and bronchitic change. No definite CHF or focal pneumonia. Negative for edema, effusion or pneumothorax. Trachea is midline. Degenerative changes of the spine. Atherosclerosis of the aorta. IMPRESSION: Mild cardiomegaly with vascular congestion and chronic bronchitic change. No definite CHF or focal pneumonia Thoracic aortic atherosclerosis Electronically Signed   By: Judie PetitM.  Shick M.D.   On: 01/25/2017 11:15   Ct Head Wo Contrast  Result Date: 01/27/2017 CLINICAL DATA:  Altered mental status.  History of schizophrenia, polysubstance abuse, hypertension, hepatitis-C, diabetes, and cocaine abuse. EXAM: CT HEAD WITHOUT CONTRAST TECHNIQUE: Contiguous axial images were obtained from the base of the skull through the vertex without intravenous contrast. COMPARISON:  07/29/2015 FINDINGS: Brain: No evidence of acute infarction, hemorrhage, hydrocephalus, extra-axial collection or mass lesion/mass effect. Vascular: Vascular calcifications are present in the intracranial internal carotid arteries. Skull: Normal. Negative for fracture or focal lesion. Sinuses/Orbits: Mild mucosal thickening in the paranasal sinuses. Small retention cyst in the left sphenoid sinus. Mastoid air cells are not opacified. Other: None. IMPRESSION: No acute intracranial abnormalities. Electronically Signed   By: Burman NievesWilliam  Stevens M.D.   On: 01/27/2017 02:25    Procedures Procedures (including critical care time)  Medications Ordered in ED Medications - No data to display   Initial Impression / Assessment and Plan / ED Course  I have reviewed the triage vital signs and the nursing notes.  Pertinent labs & imaging results that were available during my care of the patient were reviewed by me and considered in my medical decision making (see chart for details).     Altered mental status workup performed given patient's degree of somnolence. He has a difficult time staying awake.  Altered mental status workup is normal. CT head is negative. No CO2 retention. Patient does admit to using cocaine just prior to arrival. Denies any chest pain or shortness of breath.  Troponin negative 2 greater than 6 hours after cocaine use. EKG is unchanged.  Patient is now awake and answering questions, he is intermittently aggressive. He denies suicidal thoughts now but reported that earlier last night. TTS consult was obtained and patient will need evaluation later today by psychiatry. Holding orders are placed. He is medically  clear.  Final Clinical Impressions(s) / ED Diagnoses   Final diagnoses:  Suicidal ideation  Cocaine abuse    New Prescriptions New Prescriptions   No medications on file   I personally performed the services described in this documentation, which was scribed in my presence. The recorded information has been reviewed and is accurate.     Glynn Octaveancour, Chamar Broughton, MD 01/27/17 [phone]

## 2017-01-27 NOTE — ED Triage Notes (Signed)
PT requesting tylenol for pain.

## 2017-01-27 NOTE — ED Notes (Signed)
TTS at bedside. 

## 2017-01-28 LAB — AMMONIA: AMMONIA: 35 umol/L (ref 9–35)

## 2017-01-28 NOTE — ED Notes (Signed)
[facility] to be drawn d/t first one was high. [facility]

## 2017-01-28 NOTE — Progress Notes (Signed)
[facility]  Alternate Number for report:  [phone].   Baldo DaubJolan Adenike Shidler MSW, LCSWA CSW Disposition [phone]

## 2017-01-28 NOTE — Progress Notes (Signed)
[facility] accepted [name]. Accepting & attending physician  is Dr. Dorathy DaftNadia Myer.   Number for report is [phone].Patient can transport at anytime, bed is available.    Jerrol Bananaebecca Berman, RN notified.   Baldo DaubJolan Kain Milosevic MSW, LCSWA CSW Disposition [phone]

## 2017-01-28 NOTE — ED Notes (Signed)
Pt aware and voiced agreement w/tx plan - [facility] - Dr Dorathy DaftNadia Myer.

## 2017-03-23 ENCOUNTER — Encounter (HOSPITAL_COMMUNITY): Payer: Self-pay | Admitting: Emergency Medicine

## 2017-03-23 DIAGNOSIS — R45851 Suicidal ideations: Secondary | ICD-10-CM | POA: Insufficient documentation

## 2017-03-23 DIAGNOSIS — I509 Heart failure, unspecified: Secondary | ICD-10-CM | POA: Insufficient documentation

## 2017-03-23 DIAGNOSIS — F209 Schizophrenia, unspecified: Secondary | ICD-10-CM | POA: Insufficient documentation

## 2017-03-23 DIAGNOSIS — I110 Hypertensive heart disease with heart failure: Secondary | ICD-10-CM | POA: Insufficient documentation

## 2017-03-23 DIAGNOSIS — Z79899 Other long term (current) drug therapy: Secondary | ICD-10-CM | POA: Insufficient documentation

## 2017-03-23 DIAGNOSIS — E1140 Type 2 diabetes mellitus with diabetic neuropathy, unspecified: Secondary | ICD-10-CM | POA: Insufficient documentation

## 2017-03-23 DIAGNOSIS — F1410 Cocaine abuse, uncomplicated: Secondary | ICD-10-CM | POA: Insufficient documentation

## 2017-03-23 DIAGNOSIS — F17210 Nicotine dependence, cigarettes, uncomplicated: Secondary | ICD-10-CM | POA: Insufficient documentation

## 2017-03-23 NOTE — ED Triage Notes (Signed)
Patient here with chest pain after cocaine use 5 hours ago.  Patient has some mild shortness of breath.  Patient also suicidal for last 5 days, no specific plan.

## 2017-03-24 ENCOUNTER — Emergency Department (HOSPITAL_COMMUNITY)
Admission: EM | Admit: 2017-03-24 | Discharge: 2017-03-24 | Disposition: A | Discharge status: Home / Self Care | Payer: Medicare Other | Attending: Emergency Medicine | Admitting: Emergency Medicine

## 2017-03-24 ENCOUNTER — Emergency Department (HOSPITAL_COMMUNITY): Payer: Medicare Other

## 2017-03-24 DIAGNOSIS — G8929 Other chronic pain: Secondary | ICD-10-CM

## 2017-03-24 DIAGNOSIS — M79673 Pain in unspecified foot: Secondary | ICD-10-CM | POA: Diagnosis not present

## 2017-03-24 DIAGNOSIS — R4587 Impulsiveness: Secondary | ICD-10-CM

## 2017-03-24 DIAGNOSIS — F17210 Nicotine dependence, cigarettes, uncomplicated: Secondary | ICD-10-CM

## 2017-03-24 DIAGNOSIS — R45851 Suicidal ideations: Secondary | ICD-10-CM

## 2017-03-24 DIAGNOSIS — F1994 Other psychoactive substance use, unspecified with psychoactive substance-induced mood disorder: Secondary | ICD-10-CM | POA: Diagnosis not present

## 2017-03-24 DIAGNOSIS — F1420 Cocaine dependence, uncomplicated: Secondary | ICD-10-CM | POA: Diagnosis not present

## 2017-03-24 DIAGNOSIS — F419 Anxiety disorder, unspecified: Secondary | ICD-10-CM

## 2017-03-24 DIAGNOSIS — R450 Nervousness: Secondary | ICD-10-CM | POA: Diagnosis not present

## 2017-03-24 LAB — I-STAT TROPONIN, ED
Troponin i, poc: 0.01 ng/mL (ref 0.00–0.08)
Troponin i, poc: 0.02 ng/mL (ref 0.00–0.08)

## 2017-03-24 LAB — CBC
HEMATOCRIT: 38.5 % — AB (ref 39.0–52.0)
HEMOGLOBIN: 12.1 g/dL — AB (ref 13.0–17.0)
MCH: 28.1 pg (ref 26.0–34.0)
MCHC: 31.4 g/dL (ref 30.0–36.0)
MCV: 89.3 fL (ref 78.0–100.0)
PLATELETS: 161 10*3/uL (ref 150–400)
RBC: 4.31 MIL/uL (ref 4.22–5.81)
RDW: 13 % (ref 11.5–15.5)
WBC: 5.2 10*3/uL (ref 4.0–10.5)

## 2017-03-24 LAB — COMPREHENSIVE METABOLIC PANEL
ALT: 28 U/L (ref 17–63)
AST: 59 U/L — AB (ref 15–41)
Albumin: 3.5 g/dL (ref 3.5–5.0)
Alkaline Phosphatase: 85 U/L (ref 38–126)
Anion gap: 8 (ref 5–15)
BUN: 18 mg/dL (ref 6–20)
CHLORIDE: 109 mmol/L (ref 101–111)
CO2: 23 mmol/L (ref 22–32)
CREATININE: 0.94 mg/dL (ref 0.61–1.24)
Calcium: 9 mg/dL (ref 8.9–10.3)
GFR calc Af Amer: >60 mL/min (ref >60)
Glucose, Bld: 204 mg/dL — ABNORMAL HIGH (ref 65–99)
Potassium: 3.3 mmol/L — ABNORMAL LOW (ref 3.5–5.1)
Sodium: 140 mmol/L (ref 135–145)
Total Bilirubin: 0.5 mg/dL (ref 0.3–1.2)
Total Protein: 6.2 g/dL — ABNORMAL LOW (ref 6.5–8.1)

## 2017-03-24 LAB — ETHANOL

## 2017-03-24 LAB — RAPID URINE DRUG SCREEN, HOSP PERFORMED
Amphetamines: NOT DETECTED
BARBITURATES: NOT DETECTED
Benzodiazepines: NOT DETECTED
COCAINE: POSITIVE — AB
Opiates: NOT DETECTED
Tetrahydrocannabinol: NOT DETECTED

## 2017-03-24 LAB — SALICYLATE LEVEL

## 2017-03-24 LAB — CBG MONITORING, ED: Glucose-Capillary: 138 mg/dL — ABNORMAL HIGH (ref 65–99)

## 2017-03-24 LAB — ACETAMINOPHEN LEVEL: Acetaminophen (Tylenol), Serum: <10 ug/mL — ABNORMAL LOW (ref 10–30)

## 2017-03-24 MED ORDER — POTASSIUM CHLORIDE CRYS ER 20 MEQ PO TBCR
40.0000 meq | EXTENDED_RELEASE_TABLET | Freq: Once | ORAL | Status: AC
  Administered 2017-03-24: 40 meq via ORAL
  Filled 2017-03-24: qty 2

## 2017-03-24 MED ORDER — HYDROXYZINE HCL 25 MG PO TABS: 25.0000 mg | ORAL_TABLET | Freq: Four times a day (QID) | ORAL | Status: DC | PRN

## 2017-03-24 MED ORDER — LAMOTRIGINE 25 MG PO TABS
25.0000 mg | ORAL_TABLET | Freq: Every day | ORAL | Status: DC
  Administered 2017-03-24: 25 mg via ORAL
  Filled 2017-03-24: qty 1

## 2017-03-24 MED ORDER — OLANZAPINE 5 MG PO TABS: 7.5000 mg | ORAL_TABLET | Freq: Every day | ORAL | Status: DC

## 2017-03-24 MED ORDER — NICOTINE 14 MG/24HR TD PT24
14.0000 mg | MEDICATED_PATCH | Freq: Every day | TRANSDERMAL | Status: DC
  Administered 2017-03-24: 14 mg via TRANSDERMAL
  Filled 2017-03-24: qty 1

## 2017-03-24 MED ORDER — BUPROPION HCL 75 MG PO TABS
75.0000 mg | ORAL_TABLET | Freq: Every day | ORAL | Status: DC
  Filled 2017-03-24 (×2): qty 1

## 2017-03-24 MED ORDER — AMLODIPINE BESYLATE 5 MG PO TABS
10.0000 mg | ORAL_TABLET | Freq: Every day | ORAL | Status: DC
  Administered 2017-03-24: 10 mg via ORAL
  Filled 2017-03-24: qty 2

## 2017-03-24 MED ORDER — TRAZODONE HCL 50 MG PO TABS: 50.0000 mg | ORAL_TABLET | Freq: Every day | ORAL | Status: DC

## 2017-03-24 NOTE — ED Notes (Signed)
TTS in progress 

## 2017-03-24 NOTE — Progress Notes (Signed)
Per Fransisca Kaufmann, NP, the patient does not meet criteria for inpatient treatment. The patient is recommended for DISCHARGE and to follow up with outpatient providers. Patient is Psych Cleared.   Patient is homeless and requesting resources for shelters. CSW also recommends the patient follow up with [facility] for outpatient services. Walk-In's accepted Monday-Friday 8:00am-5:00pm.   Disposition CSW notified ED CSW, Hortencia Pilar, LCSWA [phone]). She agreed to provide housing resources to the patient upon discharge.    Ermalinda Memos, RN notified.    Baldo Daub MSW, LCSWA CSW Disposition [phone]

## 2017-03-24 NOTE — ED Provider Notes (Signed)
[facility] Provider Note   CSN: [id number] Arrival date & time: 03/23/17  2348     History   Chief Complaint Chief Complaint  Patient presents with  . Chest Pain  . Suicidal    HPI JOHNAVON MCCLAFFERTY is a 56 y.o. male.  HPI   DUAINE RADIN is a 56 y.o. male, with a history of cocaine abuse, polysubstance abuse, CHF, DM, HTN, and hepatitis C, presenting to the ED with reported suicidal ideations. When asked about these patient states, "Yes, I'm suicidal." When asked about a plan, he states, "I plan to kill me. I don't know how yet." He denies chest pain upon my initial interview.  Patient denies alcohol or illicit drug use. He denies head trauma. States he has been compliant with his medications. Denies A/V hallucinations. Denies SOB, N/V/D, fever/chills, abdominal pain, neck/back pain, or any other complaints.     Past Medical History:  Diagnosis Date  . CHF (congestive heart failure) (HCC)   . Chronic foot pain   . Cocaine abuse   . Diabetes mellitus without complication (HCC)   . Hepatitis C   . Homelessness   . Homelessness   . Hypertension   . Neuropathy   . Polysubstance abuse   . Schizophrenia [facility])     Patient Active Problem List   Diagnosis Date Noted  . Chronic foot pain   . Schizoaffective disorder, bipolar type (HCC) 09/30/2016  . Suicidal thoughts   . Substance induced mood disorder (HCC) 03/13/2015  . Acute kidney failure (HCC) 01/26/2015  . Schizophrenia, paranoid type (HCC) 01/17/2015  . Suicidal ideation   . Drug hallucinosis (HCC) 10/08/2014  . Chronic paranoid schizophrenia (HCC) 09/07/2014  . 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 Acute CHF (congestive heart failure) (HCC) 07/29/2014  . Cocaine use disorder, severe, dependence (HCC)   . Cocaine abuse 03/10/2014  . Essential hypertension, benign 03/28/2013  . Diabetes mellitus (HCC) 03/15/2013     History reviewed. No pertinent surgical history.     Home Medications    Prior to Admission medications   Medication Sig Start Date End Date Taking? Authorizing Provider  amLODipine (NORVASC) 10 MG tablet Take 1 tablet (10 mg total) by mouth daily. 10/05/16   Adonis Brook, NP  buPROPion (WELLBUTRIN) 75 MG tablet Take 1 tablet (75 mg total) by mouth daily. 10/05/16   Adonis Brook, NP  hydrOXYzine (ATARAX/VISTARIL) 25 MG tablet Take 1 tablet (25 mg total) by mouth every 6 (six) hours as needed for anxiety. 10/04/16   Adonis Brook, NP  lamoTRIgine (LAMICTAL) 25 MG tablet Take 1 tablet (25 mg total) by mouth daily. 10/05/16   Adonis Brook, NP  nicotine (NICODERM CQ - DOSED IN MG/24 HOURS) 14 mg/24hr patch Place 1 patch (14 mg total) onto the skin daily. 10/05/16   Adonis Brook, NP  OLANZapine (ZYPREXA) 7.5 MG tablet Take 1 tablet (7.5 mg total) by mouth at bedtime. 10/04/16   Adonis Brook, NP  traZODone (DESYREL) 50 MG tablet Take 1 tablet (50 mg total) by mouth at bedtime. 10/04/16   Adonis Brook, NP    Family History Family History  Problem Relation Age of Onset  . Hypertension Other   . Diabetes Other     Social History Social History  Substance Use Topics  . Smoking status: Current Every Day Smoker    Packs/day: 1.00    Years: 20.00    Types: Cigarettes  . Smokeless tobacco: Current  User  . Alcohol use Yes     Comment: Daily Drinker      Allergies   Haldol [haloperidol]   Review of Systems Review of Systems  Constitutional: Negative for chills, diaphoresis and fever.  Respiratory: Negative for cough and shortness of breath.   Cardiovascular: Negative for chest pain.  Gastrointestinal: Negative for abdominal pain, diarrhea, nausea and vomiting.  Musculoskeletal: Negative for back pain and neck pain.  Neurological: Negative for headaches.  Psychiatric/Behavioral: Positive for sleep disturbance and suicidal ideas. Negative for hallucinations.  All other  systems reviewed and are negative.    Physical Exam Updated Vital Signs BP (!) 164/93 (BP Location: Left Arm)   Pulse 74   Temp 98.2 F (36.8 C) (Oral)   Resp 18   SpO2 96%   Physical Exam  Constitutional: He appears well-developed and well-nourished. No distress.  HENT:  Head: Normocephalic and atraumatic.  Eyes: Conjunctivae are normal.  Neck: Neck supple.  Cardiovascular: Normal rate, regular rhythm, normal heart sounds and intact distal pulses.   Pulmonary/Chest: Effort normal and breath sounds normal. No respiratory distress.  Abdominal: Soft. There is no tenderness. There is no guarding.  Musculoskeletal: He exhibits no edema.  Normal motor function intact in all extremities and spine. No midline spinal tenderness.   Lymphadenopathy:    He has no cervical adenopathy.  Neurological:  Patient is somnolent, but follows commands.  No noted sensory deficits. Strength 5/5 in all extremities. No gait disturbance. Coordination intact including heel to shin and finger to nose. Cranial nerves III-XII grossly intact. No facial droop.   Skin: Skin is warm and dry. Capillary refill takes less than 2 seconds. He is not diaphoretic.  Psychiatric: He has a normal mood and affect. His behavior is normal.  Nursing note and vitals reviewed.    ED Treatments / Results  Labs (all labs ordered are listed, but only abnormal results are displayed) Labs Reviewed  CBC - Abnormal; Notable for the following:       Result Value   Hemoglobin 12.1 (*)    HCT 38.5 (*)    All other components within normal limits  ACETAMINOPHEN LEVEL - Abnormal; Notable for the following:    Acetaminophen (Tylenol), Serum <10 (*)    All other components within normal limits  RAPID URINE DRUG SCREEN, HOSP PERFORMED - Abnormal; Notable for the following:    Cocaine POSITIVE (*)    All other components within normal limits  COMPREHENSIVE METABOLIC PANEL - Abnormal; Notable for the following:    Potassium 3.3 (*)     Glucose, Bld 204 (*)    Total Protein 6.2 (*)    AST 59 (*)    All other components within normal limits  ETHANOL  SALICYLATE LEVEL  I-STAT TROPONIN, ED  I-STAT TROPONIN, ED    EKG  EKG Interpretation  Date/Time:  Sunday March 23 2017 23:50:45 EDT Ventricular Rate:  79 PR Interval:  138 QRS Duration: 88 QT Interval:  420 QTC Calculation: 481 R Axis:   -34 Text Interpretation:  Normal sinus rhythm Left axis deviation Septal infarct , age undetermined T wave abnormality, consider lateral ischemia Abnormal ECG When compared with ECG of 01/27/2017, No significant change was found Confirmed by Dione Booze ([id number]) on 03/24/2017 3:44:52 AM       Radiology Dg Chest 2 View  Result Date: 03/24/2017 CLINICAL DATA:  Chest pain this evening.  Suicidal. EXAM: CHEST  2 VIEW COMPARISON:  01/25/2017 FINDINGS: The heart size and mediastinal contours  are within normal limits. Mild uncoiling of the thoracic aorta with aortic atherosclerosis. No aneurysm. Atelectasis is seen at the lung bases. Osteoarthritis of the glenohumeral and AC joints with spurring. IMPRESSION: No active cardiopulmonary disease.  Aortic atherosclerosis. Electronically Signed   By: Tollie Eth M.D.   On: 03/24/2017 00:49    Procedures Procedures (including critical care time)  Medications Ordered in ED Medications  amLODipine (NORVASC) tablet 10 mg (not administered)  buPROPion (WELLBUTRIN) tablet 75 mg (not administered)  hydrOXYzine (ATARAX/VISTARIL) tablet 25 mg (not administered)  lamoTRIgine (LAMICTAL) tablet 25 mg (not administered)  nicotine (NICODERM CQ - dosed in mg/24 hours) patch 14 mg (not administered)  OLANZapine (ZYPREXA) tablet 7.5 mg (not administered)  traZODone (DESYREL) tablet 50 mg (not administered)  potassium chloride SA (K-DUR,KLOR-CON) CR tablet 40 mEq (40 mEq Oral Given 03/24/17 0454)     Initial Impression / Assessment and Plan / ED Course  I have reviewed the triage vital signs and  the nursing notes.  Pertinent labs & imaging results that were available during my care of the patient were reviewed by me and considered in my medical decision making (see chart for details).     Patient presents with suicidal ideations. Patient was not forthcoming with information during my interview. [facility] counselor and NP recommend morning psychiatry evaluation. Medically cleared. Placed in psych hold. Home medications ordered.    Findings and plan of care discussed with Dione Booze, MD. Dr. Preston Fleeting personally evaluated and examined this patient.   Final Clinical Impressions(s) / ED Diagnoses   Final diagnoses:  Suicidal ideations    New Prescriptions New Prescriptions   No medications on file     Concepcion Living 03/24/17 0550    Dione Booze, MD 03/24/17 [phone]

## 2017-03-24 NOTE — ED Notes (Signed)
Pt eating breakfast and tolerating well.

## 2017-03-24 NOTE — BH Assessment (Signed)
[facility] Assessment Progress Note   Per Nira Conn, NP pt will need an AM psych eval. EDP Joy, Hillard Danker, PA-C and Tresa Endo, RN notified of recommendation.   Princess Bruins, MSW, LCSWA TTS Specialist [phone]

## 2017-03-24 NOTE — ED Notes (Addendum)
Pt brought to ED psych hold from triage. Per triage RN, pt SI and reporting CP after cocaine consumption 5 hours ago. Pt endorses these statements. Pt states he is "too tired" and drifting to sleep during RN assessment. Pt voice low and slurred. Pt nods that he does not have auditory or visual hallucinations. Pt drifting off to sleep and snoring quickly after short responses.  Pt dressed out in purple scrubs and wanded by security in traige; IV present.

## 2017-03-24 NOTE — ED Notes (Signed)
Pt placed in wine colored scrubs and blue socks. Pt given a [place] sandwich, peanut butter, graham crackers, apple sauce, sprite on ice and 2-milks. Security called to wand pt.

## 2017-03-24 NOTE — ED Notes (Signed)
Pt States he does understandsinstrcutin, Home stable with steady gait after return of all belongings.

## 2017-03-24 NOTE — Consult Note (Signed)
[facility] Tele-Psychiatry Consult   Reason for Consult:  Suicidal ideation Referring Physician:  EDP Patient Identification: [name] MRN:  [id number] Principal Diagnosis: Cocaine use disorder, severe, dependence ([place]) Diagnosis:   Patient Active Problem List   Diagnosis Date Noted  . Chronic foot pain [M79.673, G89.29]   . Schizoaffective disorder, bipolar type ([place]) [F25.0] 09/30/2016  . Suicidal thoughts [R45.851]   . Substance induced mood disorder ([place]) [F19.94] 03/13/2015  . Acute kidney failure ([place]) [N17.9] 01/26/2015  . Schizophrenia, paranoid type ([place]) [F20.0] 01/17/2015  . Suicidal ideation [R45.851]   . Drug hallucinosis ([place]) [F19.951] 10/08/2014  . Chronic paranoid schizophrenia ([place]) [F20.0] 09/07/2014  . Substance or medication-induced bipolar and related disorder with onset during intoxication ([place]) [F19.94] 08/10/2014  . Acute CHF (congestive heart failure) ([place]) [I50.9] 07/29/2014  . Cocaine use disorder, severe, dependence ([place]) [F14.20]   . Cocaine abuse [F14.10] 03/10/2014  . Essential hypertension, benign [I10] 03/28/2013  . Diabetes mellitus ([place]) [E11.9] 03/15/2013    Total Time spent with patient: 20 minutes  Subjective:   [name] is a 56 y.o. male patient presented to [facility] with complaints of suicidal ideation.  HPI:    Per initial [facility] Assessment 03/24/2017 by Lind Covert:   [name] is an 56 y.o. male who presents to the ED voluntarily due to ongoing SI. Pt inaudible throughout assessment with slurred rapid speech. Pt tangential and incomprehensible. Pt does report he has been thinking of suicide with a plan but does not disclose his plan to this Probation officer. Pt continues to fall asleep throughout the assessment and his name had to be called several times in order to proceed with the assessment. Pt stated "I smoke crack. I just want to go to a group home or a state hospital. The crack is going to put me in my grave." Pt  continued to ramble and presented an inconsistent thought pattern. Pt stated "she couldn't have no babies. I'm just tired. I ain't slept in 6 days. They got witchcraft on me. They don't care about me in the streets. I need to get to [facility]."   Pt has hx of ED visits and inpt admissions c/o similar concerns. Pt was last assessed by TTS on 01/27/17 and was admitted for inpt treatment at [facility] on 01/28/17. Pt does not disclose to this writer if he has an OPT provider, however per chart pt has a hx of OPT providers. Pt denies HI, pt denies AVH at present however per chart pt has a hx of AVH.    Per psychiatric assessment on 03/24/2017. The patient is focused on coming to [facility] "so Rod can help me get into a group home. [name] is in a very pleasant mood stating to writer "Maybe when I see you out we can grab a coffee. I just need more support. My cousin does not help me out anymore. I am on my own. I was an only child. All my family is gone. I just got out of prison for cocaine charges. I have to get my check going again. I just don't want to freeze out there. I am not wanting to hurt myself." Patient has a history of numerous admissions for inpatient related to cocaine abuse and homelessness. The patient usually does not follow up with outpatient treatment after discharge from the hospital.    Past Psychiatric History: Cocaine dependence/abuse, Substance induced mood disorder, Schizophrenia  Risk to Self: Suicidal Ideation: Yes-Currently Present Suicidal Intent: At times endorses intent  Is patient at risk for suicide?: Yes Suicidal Plan?: No Access to Means: No (STS NO ACCESS TO GUNS) What has been your use of drugs/alcohol within the last 12 months?:  (DAILY USE OF COCAINE PER PT) How many times?:  (0) Other Self Harm Risks:  (NONE REPORTED) Triggers for Past Attempts: None known Intentional Self Injurious Behavior: None Risk to Others: Homicidal Ideation: No Thoughts of Harm to  Others: No Current Homicidal Intent: No Current Homicidal Plan: No Access to Homicidal Means: No History of harm to others?: No Assessment of Violence: None Noted Does patient have access to weapons?: No Criminal Charges Pending?: No Does patient have a court date: No Prior Inpatient Therapy: Prior Inpatient Therapy: Yes Prior Therapy Dates: 2018, 2017, and mult other admits Prior Therapy Facilty/Provider(s): [facility], [place]  Reason for Treatment: Schizophrenia  Prior Outpatient Therapy: Prior Outpatient Therapy: Yes Prior Therapy Dates: unknown Prior Therapy Facilty/Provider(s): unknown Reason for Treatment: unknown Does patient have an ACCT team?: Unknown Does patient have Intensive In-House Services?  : Unknown Does patient have [name] services? : Unknown Does patient have P4CC services?: Unknown  Past Medical History:  Past Medical History:  Diagnosis Date  . CHF (congestive heart failure) ([place])   . Chronic foot pain   . Cocaine abuse   . Diabetes mellitus without complication ([place])   . Hepatitis C   . Homelessness   . Homelessness   . Hypertension   . Neuropathy   . Polysubstance abuse   . Schizophrenia ([place])    History reviewed. No pertinent surgical history. Family History:  Family History  Problem Relation Age of Onset  . Hypertension Other   . Diabetes Other    Family Psychiatric  History: Unaware Social History:  History  Alcohol Use  . Yes    Comment: Daily Drinker      History  Drug Use  . Frequency: 7.0 times per week  . Types: "Crack" cocaine, Cocaine, Marijuana    Comment: Cocaine tonight, Marijuana "a long time"    Social History   Social History  . Marital status: Divorced    Spouse name: N/A  . Number of children: N/A  . Years of education: N/A   Social History Main Topics  . Smoking status: Current Every Day Smoker    Packs/day: 1.00    Years: 20.00    Types: Cigarettes  . Smokeless tobacco: Current User  . Alcohol use Yes      Comment: Daily Drinker   . Drug use: Yes    Frequency: 7.0 times per week    Types: "Crack" cocaine, Cocaine, Marijuana     Comment: Cocaine tonight, Marijuana "a long time"  . Sexual activity: No   Other Topics Concern  . None   Social History Narrative   ** Merged History Encounter **       Additional Social History:    Allergies:   Allergies  Allergen Reactions  . Haldol [Haloperidol] Other (See Comments)    Muscle spasms, loss of voluntary movement. However, pt has taken Thorazine on multiple occasions with no adverse effects.     Labs:  Results for orders placed or performed during the hospital encounter of 03/24/17 (from the past 48 hour(s))  CBC     Status: Abnormal   Collection Time: 03/23/17 11:54 PM  Result Value Ref Range   WBC 5.2 4.0 - 10.5 K/uL   RBC 4.31 4.22 - 5.81 MIL/uL   Hemoglobin 12.1 (L) 13.0 - 17.0 g/dL  HCT 38.5 (L) 39.0 - 52.0 %   MCV 89.3 78.0 - 100.0 fL   MCH 28.1 26.0 - 34.0 pg   MCHC 31.4 30.0 - 36.0 g/dL   RDW 13.0 11.5 - 15.5 %   Platelets 161 150 - 400 K/uL  Ethanol     Status: None   Collection Time: 03/23/17 11:54 PM  Result Value Ref Range   Alcohol, Ethyl (B) <5 <5 mg/dL    Comment:        LOWEST DETECTABLE LIMIT FOR SERUM ALCOHOL IS 5 mg/dL FOR MEDICAL PURPOSES ONLY   Salicylate level     Status: None   Collection Time: 03/23/17 11:54 PM  Result Value Ref Range   Salicylate Lvl <2.6 2.8 - 30.0 mg/dL  Acetaminophen level     Status: Abnormal   Collection Time: 03/23/17 11:54 PM  Result Value Ref Range   Acetaminophen (Tylenol), Serum <10 (L) 10 - 30 ug/mL    Comment:        THERAPEUTIC CONCENTRATIONS VARY SIGNIFICANTLY. A RANGE OF 10-30 ug/mL MAY BE AN EFFECTIVE CONCENTRATION FOR MANY PATIENTS. HOWEVER, SOME ARE BEST TREATED AT CONCENTRATIONS OUTSIDE THIS RANGE. ACETAMINOPHEN CONCENTRATIONS >150 ug/mL AT 4 HOURS AFTER INGESTION AND >50 ug/mL AT 12 HOURS AFTER INGESTION ARE OFTEN ASSOCIATED WITH TOXIC REACTIONS.    Comprehensive metabolic panel     Status: Abnormal   Collection Time: 03/23/17 11:54 PM  Result Value Ref Range   Sodium 140 135 - 145 mmol/L   Potassium 3.3 (L) 3.5 - 5.1 mmol/L   Chloride 109 101 - 111 mmol/L   CO2 23 22 - 32 mmol/L   Glucose, Bld 204 (H) 65 - 99 mg/dL   BUN 18 6 - 20 mg/dL   Creatinine, Ser 0.94 0.61 - 1.24 mg/dL   Calcium 9.0 8.9 - 10.3 mg/dL   Total Protein 6.2 (L) 6.5 - 8.1 g/dL   Albumin 3.5 3.5 - 5.0 g/dL   AST 59 (H) 15 - 41 U/L   ALT 28 17 - 63 U/L   Alkaline Phosphatase 85 38 - 126 U/L   Total Bilirubin 0.5 0.3 - 1.2 mg/dL   GFR calc non Af Amer >60 >60 mL/min   GFR calc Af Amer >60 >60 mL/min    Comment: (NOTE) The eGFR has been calculated using the CKD EPI equation. This calculation has not been validated in all clinical situations. eGFR's persistently <60 mL/min signify possible Chronic Kidney Disease.    Anion gap 8 5 - 15  I-stat troponin, ED     Status: None   Collection Time: 03/24/17 12:06 AM  Result Value Ref Range   Troponin i, poc 0.02 0.00 - 0.08 ng/mL   Comment 3            Comment: Due to the release kinetics of cTnI, a negative result within the first hours of the onset of symptoms does not rule out myocardial infarction with certainty. If myocardial infarction is still suspected, repeat the test at appropriate intervals.   Rapid urine drug screen (hospital performed)     Status: Abnormal   Collection Time: 03/24/17  3:15 AM  Result Value Ref Range   Opiates NONE DETECTED NONE DETECTED   Cocaine POSITIVE (A) NONE DETECTED   Benzodiazepines NONE DETECTED NONE DETECTED   Amphetamines NONE DETECTED NONE DETECTED   Tetrahydrocannabinol NONE DETECTED NONE DETECTED   Barbiturates NONE DETECTED NONE DETECTED    Comment:        DRUG SCREEN FOR  MEDICAL PURPOSES ONLY.  IF CONFIRMATION IS NEEDED FOR ANY PURPOSE, NOTIFY LAB WITHIN 5 DAYS.        LOWEST DETECTABLE LIMITS FOR URINE DRUG SCREEN Drug Class       Cutoff  (ng/mL) Amphetamine      1000 Barbiturate      200 Benzodiazepine   202 Tricyclics       334 Opiates          300 Cocaine          300 THC              50   I-stat troponin, ED     Status: None   Collection Time: 03/24/17  5:10 AM  Result Value Ref Range   Troponin i, poc 0.01 0.00 - 0.08 ng/mL   Comment 3            Comment: Due to the release kinetics of cTnI, a negative result within the first hours of the onset of symptoms does not rule out myocardial infarction with certainty. If myocardial infarction is still suspected, repeat the test at appropriate intervals.   CBG monitoring, ED     Status: Abnormal   Collection Time: 03/24/17  8:36 AM  Result Value Ref Range   Glucose-Capillary 138 (H) 65 - 99 mg/dL    Current Facility-Administered Medications  Medication Dose Route Frequency Provider Last Rate Last Dose  . amLODipine (NORVASC) tablet 10 mg  10 mg Oral Daily Joy, Shawn C, PA-C   10 mg at 03/24/17 0915  . buPROPion [facility]) tablet 75 mg  75 mg Oral Daily Joy, Shawn C, PA-C      . hydrOXYzine (ATARAX/VISTARIL) tablet 25 mg  25 mg Oral Q6H PRN Joy, Shawn C, PA-C      . lamoTRIgine (LAMICTAL) tablet 25 mg  25 mg Oral Daily Joy, Shawn C, PA-C   25 mg at 03/24/17 0915  . nicotine (NICODERM CQ - dosed in mg/24 hours) patch 14 mg  14 mg Transdermal Daily Joy, Shawn C, PA-C   14 mg at 03/24/17 0918  . OLANZapine (ZYPREXA) tablet 7.5 mg  7.5 mg Oral QHS Joy, Shawn C, PA-C      . traZODone (DESYREL) tablet 50 mg  50 mg Oral QHS Joy, Shawn C, PA-C       Current Outpatient Prescriptions  Medication Sig Dispense Refill  . amLODipine (NORVASC) 10 MG tablet Take 1 tablet (10 mg total) by mouth daily. 30 tablet 0  . buPROPion (WELLBUTRIN) 75 MG tablet Take 1 tablet (75 mg total) by mouth daily. 30 tablet 0  . hydrOXYzine (ATARAX/VISTARIL) 25 MG tablet Take 1 tablet (25 mg total) by mouth every 6 (six) hours as needed for anxiety. 30 tablet 0  . lamoTRIgine (LAMICTAL) 25 MG  tablet Take 1 tablet (25 mg total) by mouth daily. 30 tablet 0  . nicotine (NICODERM CQ - DOSED IN MG/24 HOURS) 14 mg/24hr patch Place 1 patch (14 mg total) onto the skin daily. 28 patch 0  . OLANZapine (ZYPREXA) 7.5 MG tablet Take 1 tablet (7.5 mg total) by mouth at bedtime. 30 tablet 0  . traZODone (DESYREL) 50 MG tablet Take 1 tablet (50 mg total) by mouth at bedtime. 30 tablet 0    Musculoskeletal:  Unable to assess via camera   Psychiatric Specialty Exam: Physical Exam  Nursing note and vitals reviewed. Constitutional: He is oriented to person, place, and time.  Neck: Normal range of motion.  Respiratory: Effort normal.  Neurological: He is alert and oriented to person, place, and time.  Psychiatric: He has a normal mood and affect. His speech is normal and behavior is normal. Thought content is not delusional. Cognition and memory are normal. He expresses impulsivity.    Review of Systems  Psychiatric/Behavioral: Positive for depression and substance abuse. Negative for hallucinations, memory loss and suicidal ideas. The patient is nervous/anxious. The patient does not have insomnia.   All other systems reviewed and are negative.   Blood pressure (!) 156/83, pulse 84, temperature 98 F (36.7 C), temperature source Oral, resp. rate 18, SpO2 95 %.There is no height or weight on file to calculate BMI.  General Appearance: Fairly Groomed  Eye Contact:  Good  Speech:  Normal Rate and Raspy  Volume:  Increased  Mood:  Anxious  Affect:  Congruent  Thought Process:  Goal Directed  Orientation:  Full (Time, Place, and Person)  Thought Content:  Focused on getting into a group home   Suicidal Thoughts: Denies  Homicidal Thoughts:  No  Memory:  Immediate;   Good Recent;   Good Remote;   Good  Judgement:  Fair  Insight:  Fair  Psychomotor Activity:  Normal  Concentration:  Concentration: Good and Attention Span: Good  Recall:  Good  Fund of Knowledge:  Fair  Language:  Good   Akathisia:  No  Handed:  Right  AIMS (if indicated):     Assets:  Communication Skills Desire for Improvement Intimacy Social Support  ADL's:  Intact  Cognition:  WNL  Sleep:        Treatment Plan Summary: Patient is not determined to be a danger to self or others. He presents to due to stress from numerous psychosocial stressors to include lack of stable housing and social support.   Disposition: No evidence of imminent risk to self or others at present.   Patient does not meet criteria for psychiatric inpatient admission. Supportive therapy provided about ongoing stressors. Discussed crisis plan, support from social network, calling 911, coming to the Emergency Department, and calling Suicide Hotline. Provide resources from social work to assist with housing.   Elmarie Shiley, NP 03/24/2017 10:24 AM

## 2017-03-24 NOTE — BH Assessment (Addendum)
Tele Assessment Note   Patient Name: [name] MRN: [id number] Referring Physician: [facility] Location of Patient: [facility] Location of Provider: Behavioral Health TTS Department  [name] is an 56 y.o. male who presents to the ED voluntarily due to ongoing SI. Pt inaudible throughout assessment with slurred rapid speech. Pt tangential and incomprehensible. Pt does report he has been thinking of suicide with a plan but does not disclose his plan to this Clinical research associate. Pt continues to fall asleep throughout the assessment and his name had to be called several times in order to proceed with the assessment. Pt stated "I smoke crack. I just want to go to a group home or a state hospital. The crack is going to put me in my grave." Pt continued to ramble and presented an inconsistent thought pattern. Pt stated "she couldn't have no babies. I'm just tired. I ain't slept in 6 days. They got witchcraft on me. They don't care about me in the streets. I need to get to [facility]."   Pt has hx of ED visits and inpt admissions c/o similar concerns. Pt was last assessed by TTS on 01/27/17 and was admitted for inpt treatment at [facility] on 01/28/17. Pt does not disclose to this writer if he has an OPT provider, however per chart pt has a hx of OPT providers. Pt denies HI, pt denies AVH at present however per chart pt has a hx of AVH.   Per Nira Conn, NP pt will need an AM psych eval. EDP Joy, Hillard Danker, PA-C and Tresa Endo, RN notified of recommendation.   Diagnosis: Cocaine Use Disorder; hx of Schizophrenia   Past Medical History:  Past Medical History:  Diagnosis Date  . CHF (congestive heart failure) ([facility])   . Chronic foot pain   . Cocaine abuse   . Diabetes mellitus without complication ([facility])   . Hepatitis C   . Homelessness   . Homelessness   . Hypertension   . Neuropathy   . Polysubstance abuse   . Schizophrenia ([facility])     History reviewed. No pertinent surgical  history.  Family History:  Family History  Problem Relation Age of Onset  . Hypertension Other   . Diabetes Other     Social History:  reports that he has been smoking Cigarettes.  He has a 20.00 pack-year smoking history. He uses smokeless tobacco. He reports that he drinks alcohol. He reports that he uses drugs, including "Crack" cocaine, Cocaine, and Marijuana, about 7 times per week.  Additional Social History:  Alcohol / Drug Use Pain Medications: See MAR Prescriptions: SEE MAR Over the Counter: See MAR History of alcohol / drug use?: Yes Substance #1 Name of Substance 1: COCAINE 1 - Age of First Use: 15 1 - Amount (size/oz): unknown 1 - Frequency: DAILY 1 - Duration: ONGOING 1 - Last Use / Amount: 03/23/17  CIWA: CIWA-Ar BP: (!) 164/93 Pulse Rate: 74 COWS:    PATIENT STRENGTHS: (choose at least two) Financial means Motivation for treatment/growth  Allergies:  Allergies  Allergen Reactions  . Haldol [Haloperidol] Other (See Comments)    Muscle spasms, loss of voluntary movement. However, pt has taken Thorazine on multiple occasions with no adverse effects.     Home Medications:  (Not in a hospital admission)  OB/GYN Status:  No LMP for male patient.  General Assessment Data Location of Assessment: [facility] ED TTS Assessment: In system Is this a Tele or Face-to-Face Assessment?: Tele Assessment Is this  an Initial Assessment or a Re-assessment for this encounter?: Initial Assessment Marital status: Divorced Is patient pregnant?: No Pregnancy Status: No Living Arrangements: Other (Comment) (homeless) Can pt return to current living arrangement?: Yes Admission Status: Voluntary Is patient capable of signing voluntary admission?: Yes Referral Source: Self/Family/Friend Insurance type: Medicare     Crisis Care Plan Living Arrangements: Other (Comment) (homeless) Name of Psychiatrist: none reported Name of Therapist: none reported  Education Status Is  patient currently in school?: No Highest grade of school patient has completed: 12TH  Risk to self with the past 6 months Suicidal Ideation: Yes-Currently Present Has patient been a risk to self within the past 6 months prior to admission? : Yes Suicidal Intent: No-Not Currently/Within Last 6 Months Has patient had any suicidal intent within the past 6 months prior to admission? : Yes Is patient at risk for suicide?: Yes Suicidal Plan?: No-Not Currently/Within Last 6 Months Has patient had any suicidal plan within the past 6 months prior to admission? : Yes Access to Means: No What has been your use of drugs/alcohol within the last 12 months?: REPORTS TO DAILY COCAINE USE  Previous Attempts/Gestures: Yes How many times?:  (PT REPORTS MULTIPLE ATTEMPTS) Triggers for Past Attempts: Unpredictable Intentional Self Injurious Behavior: None Family Suicide History: Unknown Recent stressful life event(s): Other (Comment), Conflict (Comment), Financial Problems (OTHER HOMELESS PEOPLE TRY TO FIGHT PT) Persecutory voices/beliefs?: No Depression: Yes Depression Symptoms: Feeling angry/irritable, Insomnia Substance abuse history and/or treatment for substance abuse?: Yes Suicide prevention information given to non-admitted patients: Not applicable  Risk to Others within the past 6 months Homicidal Ideation: No Does patient have any lifetime risk of violence toward others beyond the six months prior to admission? : No Thoughts of Harm to Others: No Current Homicidal Intent: No Current Homicidal Plan: No Access to Homicidal Means: No History of harm to others?: No Assessment of Violence: None Noted Does patient have access to weapons?: No Criminal Charges Pending?: No Does patient have a court date: No Is patient on probation?: No  Psychosis Hallucinations: None noted (pt has hx of AVH but denies at present ) Delusions: None noted  Mental Status Report Appearance/Hygiene: Disheveled, In  scrubs Eye Contact: Poor Motor Activity: Freedom of movement Speech: Incoherent, Rapid Level of Consciousness: Drowsy, Quiet/awake Mood: Helpless, Despair, Worthless, low self-esteem, Anxious Affect: Flat, Anxious Anxiety Level: Moderate Thought Processes: Tangential Judgement: Impaired Orientation: Person, Place, Time, Situation Obsessive Compulsive Thoughts/Behaviors: None  Cognitive Functioning Concentration: Normal Memory: Recent Intact, Remote Intact IQ: Average Insight: Poor Impulse Control: Poor Appetite: Good Sleep: Decreased Total Hours of Sleep: 0 (pt states he has not slept in 6 days ) Vegetative Symptoms: None  ADLScreening [facility] Assessment Services) Patient's cognitive ability adequate to safely complete daily activities?: Yes Patient able to express need for assistance with ADLs?: Yes Independently performs ADLs?: Yes (appropriate for developmental age)  Prior Inpatient Therapy Prior Inpatient Therapy: Yes Prior Therapy Dates: 2018, 2017, and mult other admits Prior Therapy Facilty/Provider(s): [facility], [facility]  Reason for Treatment: Schizophrenia   Prior Outpatient Therapy Prior Outpatient Therapy: Yes Prior Therapy Dates: unknown Prior Therapy Facilty/Provider(s): unknown Reason for Treatment: unknown Does patient have an ACCT team?: Unknown Does patient have Intensive In-House Services?  : Unknown Does patient have [name] services? : Unknown Does patient have P4CC services?: Unknown  ADL Screening (condition at time of admission) Patient's cognitive ability adequate to safely complete daily activities?: Yes Is the patient deaf or have difficulty hearing?: No Does the patient have  difficulty seeing, even when wearing glasses/contacts?: No Does the patient have difficulty concentrating, remembering, or making decisions?: No Patient able to express need for assistance with ADLs?: Yes Does the patient have difficulty dressing or bathing?: No Independently  performs ADLs?: Yes (appropriate for developmental age) Does the patient have difficulty walking or climbing stairs?: No Weakness of Legs: None Weakness of Arms/Hands: None  Home Assistive Devices/Equipment Home Assistive Devices/Equipment: Cane (specify quad or straight)    Abuse/Neglect Assessment (Assessment to be complete while patient is alone) Physical Abuse: Denies Verbal Abuse: Denies Sexual Abuse: Denies Exploitation of patient/patient's resources: Denies Self-Neglect: Denies     Merchant navy officer (For Healthcare) Does Patient Have a Medical Advance Directive?: No Would patient like information on creating a medical advance directive?: No - Patient declined    Additional Information 1:1 In Past 12 Months?: Yes (per chart) CIRT Risk: Yes Elopement Risk: Yes Does patient have medical clearance?: Yes     Disposition:  Disposition Initial Assessment Completed for this Encounter: Yes Disposition of Patient: Other dispositions Other disposition(s): Other (Comment) (AM psych eval per Nira Conn, NP )  This service was provided via telemedicine using a 2-way, interactive audio and video technology.  Names of all persons participating in this telemedicine service and their role in this encounter. Name: Princess Bruins Role: TTS Counselor  Name: [name] Role: Patient          Karolee Ohs 03/24/2017 5:10 AM

## 2017-03-26 ENCOUNTER — Encounter (HOSPITAL_COMMUNITY): Payer: Self-pay | Admitting: *Deleted

## 2017-03-26 ENCOUNTER — Emergency Department (HOSPITAL_COMMUNITY)
Admission: EM | Admit: 2017-03-26 | Discharge: 2017-03-26 | Payer: Medicare Other | Attending: Emergency Medicine | Admitting: Emergency Medicine

## 2017-03-26 DIAGNOSIS — I509 Heart failure, unspecified: Secondary | ICD-10-CM | POA: Insufficient documentation

## 2017-03-26 DIAGNOSIS — R338 Other retention of urine: Secondary | ICD-10-CM

## 2017-03-26 DIAGNOSIS — F17210 Nicotine dependence, cigarettes, uncomplicated: Secondary | ICD-10-CM | POA: Insufficient documentation

## 2017-03-26 DIAGNOSIS — E119 Type 2 diabetes mellitus without complications: Secondary | ICD-10-CM | POA: Diagnosis not present

## 2017-03-26 DIAGNOSIS — Z79899 Other long term (current) drug therapy: Secondary | ICD-10-CM | POA: Diagnosis not present

## 2017-03-26 DIAGNOSIS — I110 Hypertensive heart disease with heart failure: Secondary | ICD-10-CM | POA: Diagnosis not present

## 2017-03-26 LAB — URINALYSIS, ROUTINE W REFLEX MICROSCOPIC
BILIRUBIN URINE: NEGATIVE
Bacteria, UA: NONE SEEN
KETONES UR: NEGATIVE mg/dL
Nitrite: NEGATIVE
PH: 6 (ref 5.0–8.0)
PROTEIN: NEGATIVE mg/dL
Specific Gravity, Urine: 1.008 (ref 1.005–1.030)
Squamous Epithelial / HPF: NONE SEEN

## 2017-03-26 MED ORDER — KETOROLAC TROMETHAMINE 30 MG/ML IJ SOLN
15.0000 mg | Freq: Once | INTRAMUSCULAR | Status: AC
Start: 2017-03-26 — End: 2017-03-26
  Administered 2017-03-26: 15 mg via INTRAMUSCULAR
  Filled 2017-03-26: qty 1

## 2017-03-26 NOTE — ED Provider Notes (Signed)
[facility]-EMERGENCY DEPT Provider Note   CSN: [id number] Arrival date & time: 03/26/17  0747     History   Chief Complaint Chief Complaint  Patient presents with  . Abdominal Pain  . Dysuria  . Hypertension    HPI [name] is a 56 y.o. male.  HPI  Patient presents from jail with concern of ongoing lower abdominal pain, diminished urine production. Patient has multiple medical issues including schizophrenia, cocaine abuse, provides a rambling account of his situation, but seems to provide an accurate detail of symptoms which began over the past day or so. For the past day or so the patient has had diminished urine output and increasing pain in the suprapubic region. He denies fever, vomiting, chills. [facility]rections facility staff do not have additional details of the patient's condition, states that the patient was brought to their facility last night. The patient himself acknowledges his medical issues, complex social issues including transient homelessness, inconsistent access to medication, and ongoing substance abuse.   Past Medical History:  Diagnosis Date  . CHF (congestive heart failure) (HCC)   . Chronic foot pain   . Cocaine abuse   . Diabetes mellitus without complication (HCC)   . Hepatitis C   . Homelessness   . Homelessness   . Hypertension   . Neuropathy   . Polysubstance abuse   . Schizophrenia [facility])     Patient Active Problem List   Diagnosis Date Noted  . Chronic foot pain   . Schizoaffective disorder, bipolar type (HCC) 09/30/2016  . Suicidal thoughts   . Substance induced mood disorder (HCC) 03/13/2015  . Acute kidney failure (HCC) 01/26/2015  . Schizophrenia, paranoid type (HCC) 01/17/2015  . Suicidal ideation   . Drug hallucinosis (HCC) 10/08/2014  . Chronic paranoid schizophrenia (HCC) 09/07/2014  . 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 Acute CHF (congestive  heart failure) (HCC) 07/29/2014  . Cocaine use disorder, severe, dependence (HCC)   . Cocaine abuse 03/10/2014  . Essential hypertension, benign 03/28/2013  . Diabetes mellitus (HCC) 03/15/2013    History reviewed. No pertinent surgical history.     Home Medications    Prior to Admission medications   Medication Sig Start Date End Date Taking? Authorizing Provider  benztropine (COGENTIN) 1 MG tablet Take 1 mg by mouth daily.   Yes [provider]  hydrochlorothiazide (HYDRODIURIL) 25 MG tablet Take 25 mg by mouth daily.   Yes [provider]  hydrOXYzine (ATARAX/VISTARIL) 50 MG tablet Take 50 mg by mouth 2 (two) times daily.   Yes [provider]  lisinopril (PRINIVIL,ZESTRIL) 40 MG tablet Take 40 mg by mouth daily.   Yes [provider]    Family History Family History  Problem Relation Age of Onset  . Hypertension Other   . Diabetes Other     Social History Social History  Substance Use Topics  . Smoking status: Current Every Day Smoker    Packs/day: 1.00    Years: 20.00    Types: Cigarettes  . Smokeless tobacco: Current User  . Alcohol use Yes     Comment: Daily Drinker      Allergies   Haldol [haloperidol]   Review of Systems Review of Systems  Constitutional:       Per HPI, otherwise negative  HENT:       Per HPI, otherwise negative  Respiratory:       Per HPI, otherwise negative  Cardiovascular:  Per HPI, otherwise negative  Gastrointestinal: Negative for vomiting.  Endocrine:       Negative aside from HPI  Genitourinary:       Neg aside from HPI   Musculoskeletal:       Per HPI, otherwise negative  Skin: Negative.   Neurological: Negative for syncope.  Psychiatric/Behavioral: Positive for dysphoric mood. The patient is nervous/anxious.      Physical Exam Updated Vital Signs BP (!) 158/90 (BP Location: Right Arm)   Pulse 84   Temp 98.5 F (36.9 C) (Oral)   Resp 16   SpO2 99%   Physical Exam    Constitutional: He is oriented to person, place, and time. He appears well-developed. No distress.  HENT:  Head: Normocephalic and atraumatic.  Eyes: Conjunctivae and EOM are normal.  Cardiovascular: Normal rate and regular rhythm.   Pulmonary/Chest: Effort normal. No stridor. No respiratory distress.  Abdominal: He exhibits no distension.    Musculoskeletal: He exhibits no edema.  Neurological: He is alert and oriented to person, place, and time.  Skin: Skin is warm and dry.  Psychiatric: His mood appears anxious. His speech is tangential.  Nursing note and vitals reviewed.    ED Treatments / Results  Labs (all labs ordered are listed, but only abnormal results are displayed) Labs Reviewed  URINALYSIS, ROUTINE W REFLEX MICROSCOPIC - Abnormal; Notable for the following:       Result Value   Color, Urine STRAW (*)    Glucose, UA >=500 (*)    Hgb urine dipstick SMALL (*)    Leukocytes, UA MODERATE (*)    All other components within normal limits     Radiology Bladder scan demonstrates greater than 1 L of fluid in the bladder.    Procedures Procedures (including critical care time)  Medications Ordered in ED Medications  ketorolac (TORADOL) 30 MG/ML injection 15 mg (15 mg Intramuscular Given 03/26/17 0858)     Initial Impression / Assessment and Plan / ED Course  I have reviewed the triage vital signs and the nursing notes.  Pertinent labs & imaging results that were available during my care of the patient were reviewed by me and considered in my medical decision making (see chart for details).  On repeat exam after placement of a Foley catheter the patient has had substantial relief, is now sleeping. He awakens easily. Urinalysis and consistent with infection. Patient has no fever or I discussed the need for monitoring, follow up with correctional facility staff. With relief following placement of Foley catheter, and no evidence for infection, patient was  discharged in stable condition back to the correctional facility.  Final Clinical Impressions(s) / ED Diagnoses   Final diagnoses:  Acute urinary retention      Gerhard Munch, MD 03/26/17 1030

## 2017-03-26 NOTE — ED Notes (Signed)
ED Provider at bedside. 

## 2017-03-26 NOTE — ED Notes (Signed)
Made DR Jeraldine Loots aware of bladder scan >999

## 2017-03-26 NOTE — Discharge Instructions (Signed)
As discussed, it is very important that you monitor your condition carefully, and be sure to follow-up with a urologist within the next 2 weeks.  Return here for concerning changes in your condition.

## 2017-03-26 NOTE — ED Triage Notes (Signed)
Pt from jail complains of lower abdominal pain and burning with urination. Pt also had hypertension while at jail, pt BP was 220/110. Pt denies blood in urine. Pt states he has hx of kidney stones.

## 2017-04-08 IMAGING — US US RENAL
1 series · 14 of 25 positions shown · non-contrast
Comparison: Noncontrast CT 12/18/2008

CLINICAL DATA: Acute renal failure.

EXAM:
RENAL / URINARY TRACT ULTRASOUND COMPLETE

[Series 1: us renal · 0.22mm/px · 14 of 35 slices shown]
[im 1/35]
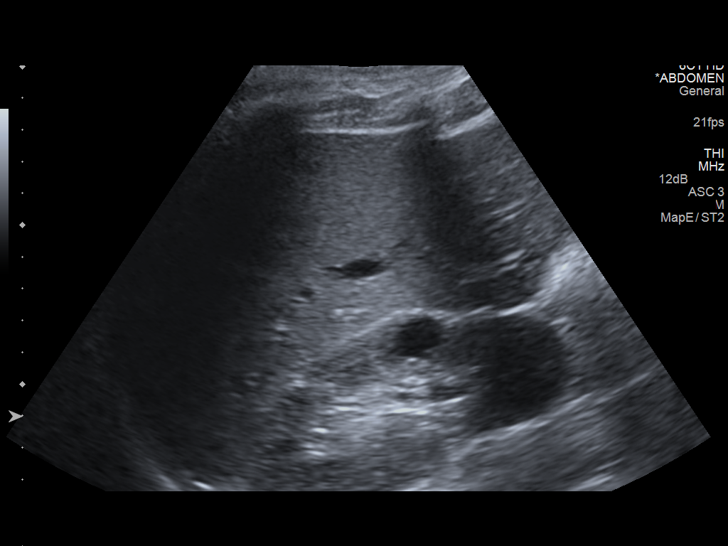
[im 3/35]
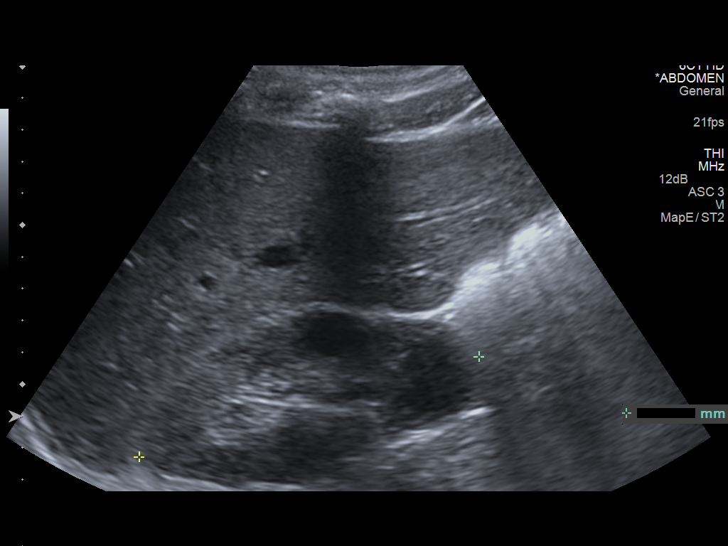
[im 6/35]
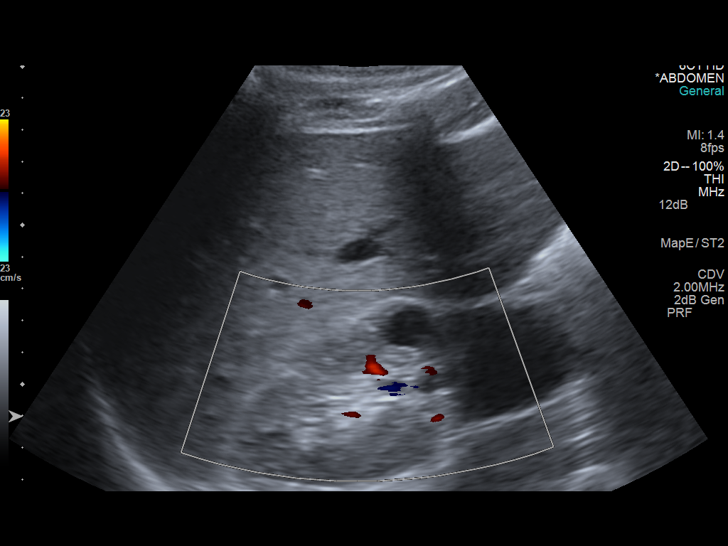
[im 9/35]
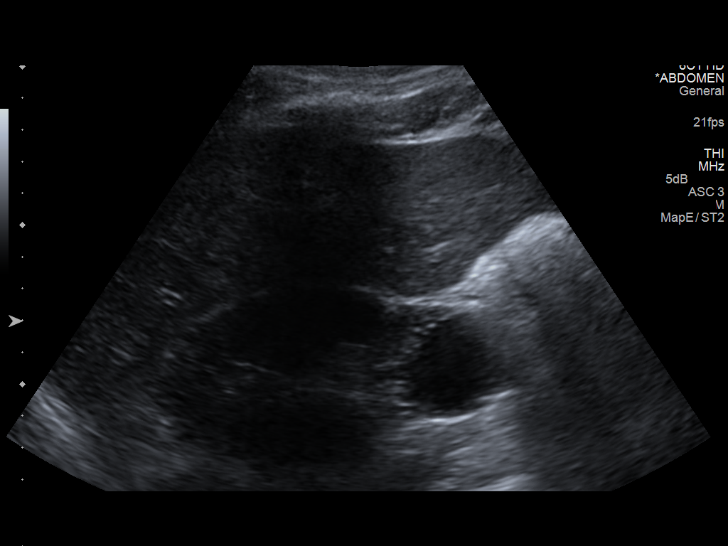
[im 12/35]
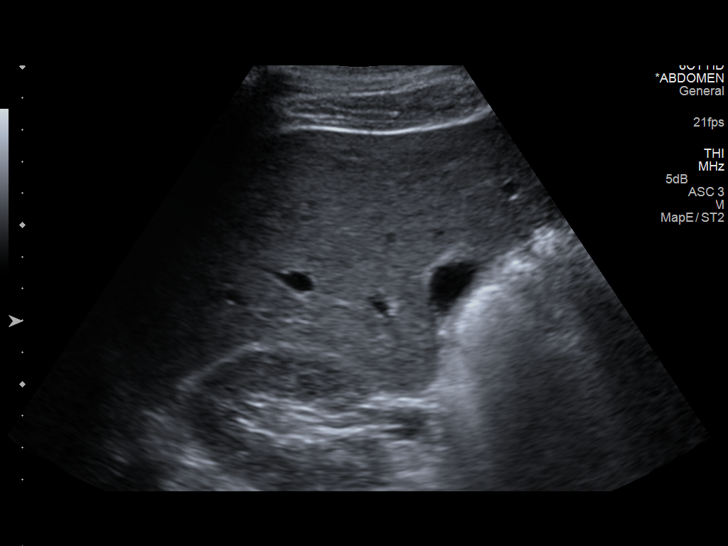
[im 13/35]
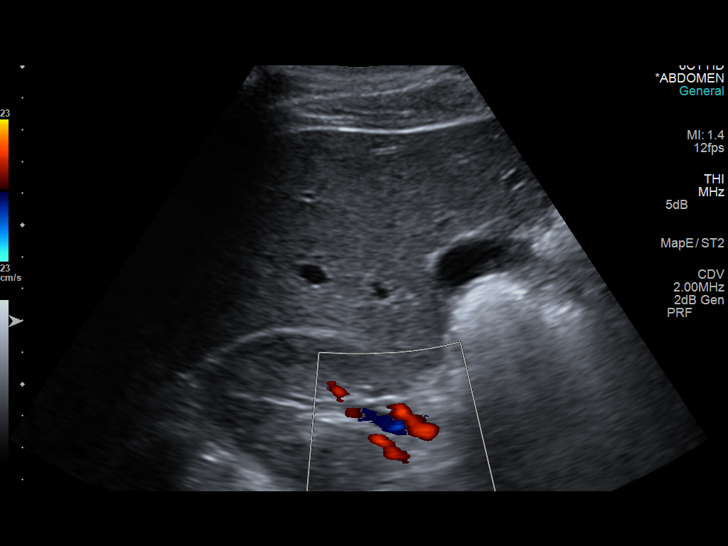
[im 16/35]
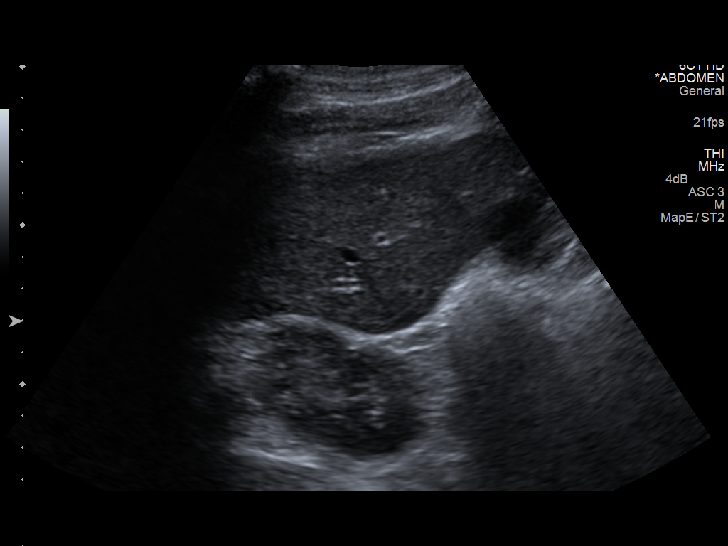
[im 19/35]
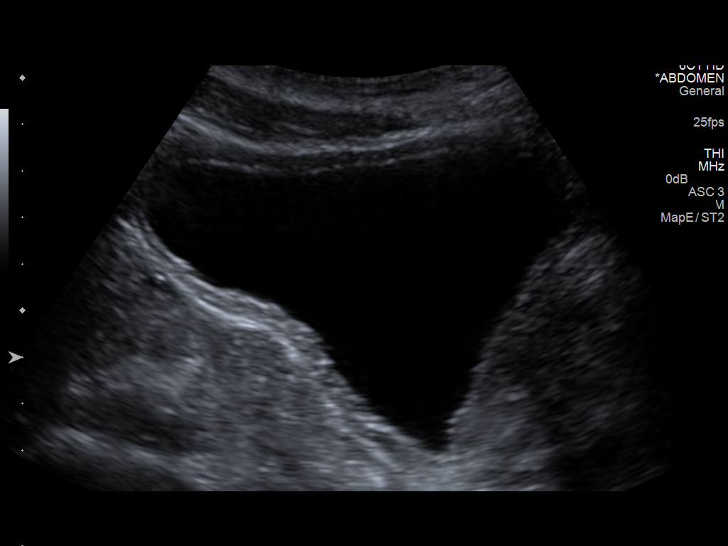
[im 22/35]
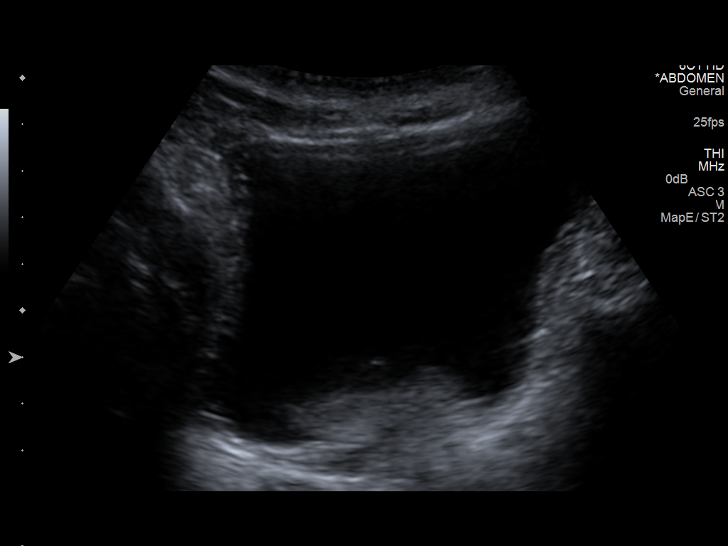
[im 23/35]
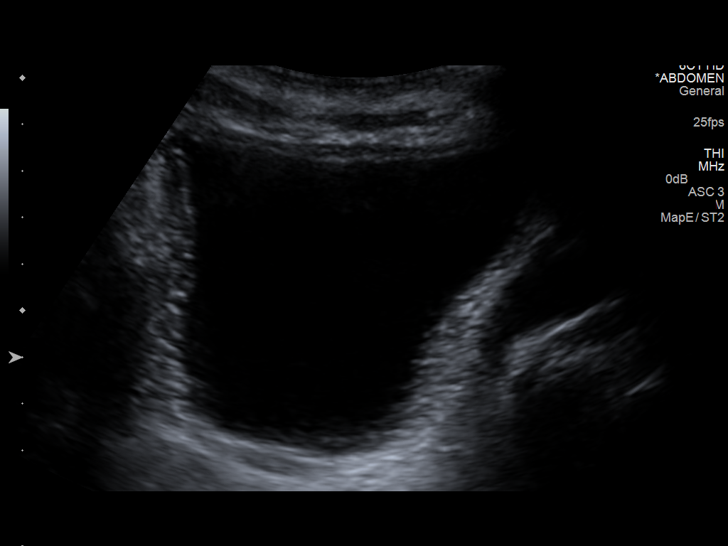
[im 26/35]
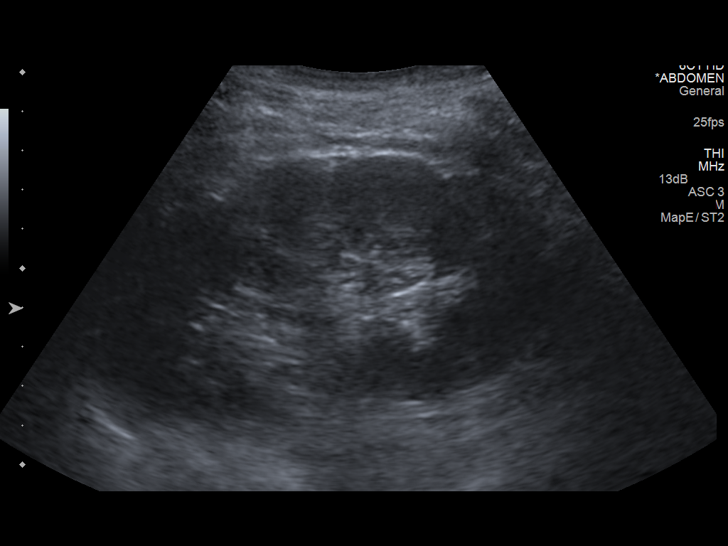
[im 29/35]
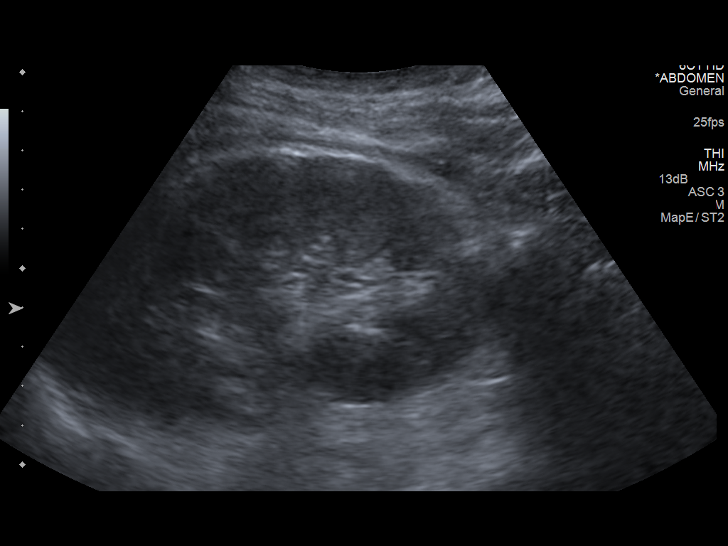
[im 32/35]
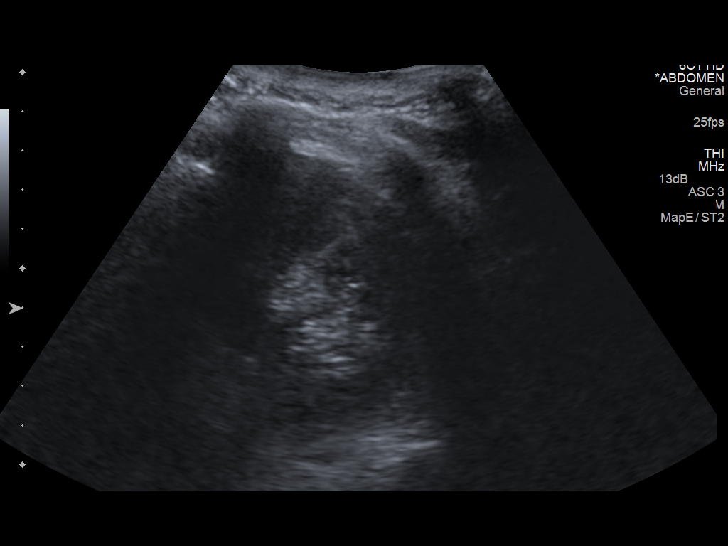
[im 35/35]
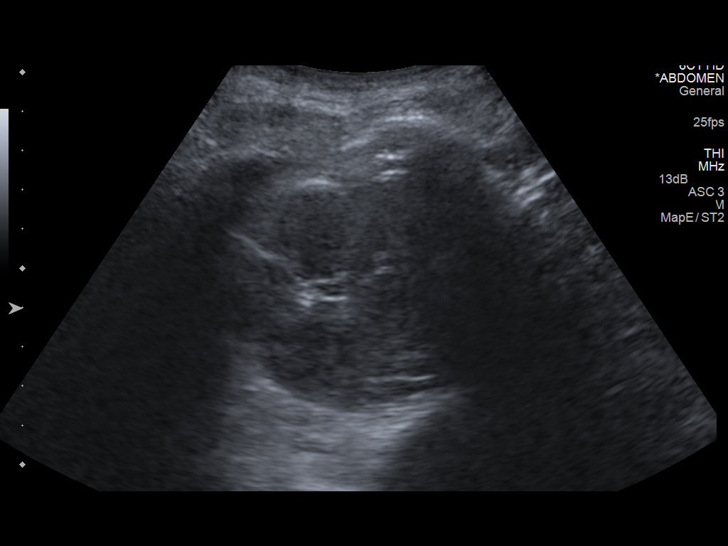

[14 of 25 positions shown; findings below may reference images not displayed]

FINDINGS: Right Kidney:

Length: 11.5 cm. Echogenicity within normal limits. Two cysts,
largest in the lower pole measuring 3.4 cm. No solid mass or
hydronephrosis visualized.

Left Kidney:

Length: 11.8 cm. Echogenicity within normal limits. No mass or
hydronephrosis visualized.

Bladder:

Bladder wall thickening at 7 mm.  No definite debris.
IMPRESSION: 1. Bladder wall thickening, correlation with urinalysis recommended.
2. No obstructive uropathy.
3. Right renal cysts.

## 2017-08-08 IMAGING — CR DG CHEST 2V
2 series · 2 of 2 positions shown · non-contrast
Comparison: 07/28/2015

CLINICAL DATA: Chest pain, confusion, CHF, history of cocaine abuse
and smoking

EXAM:
CHEST  2 VIEW

[chest lat]
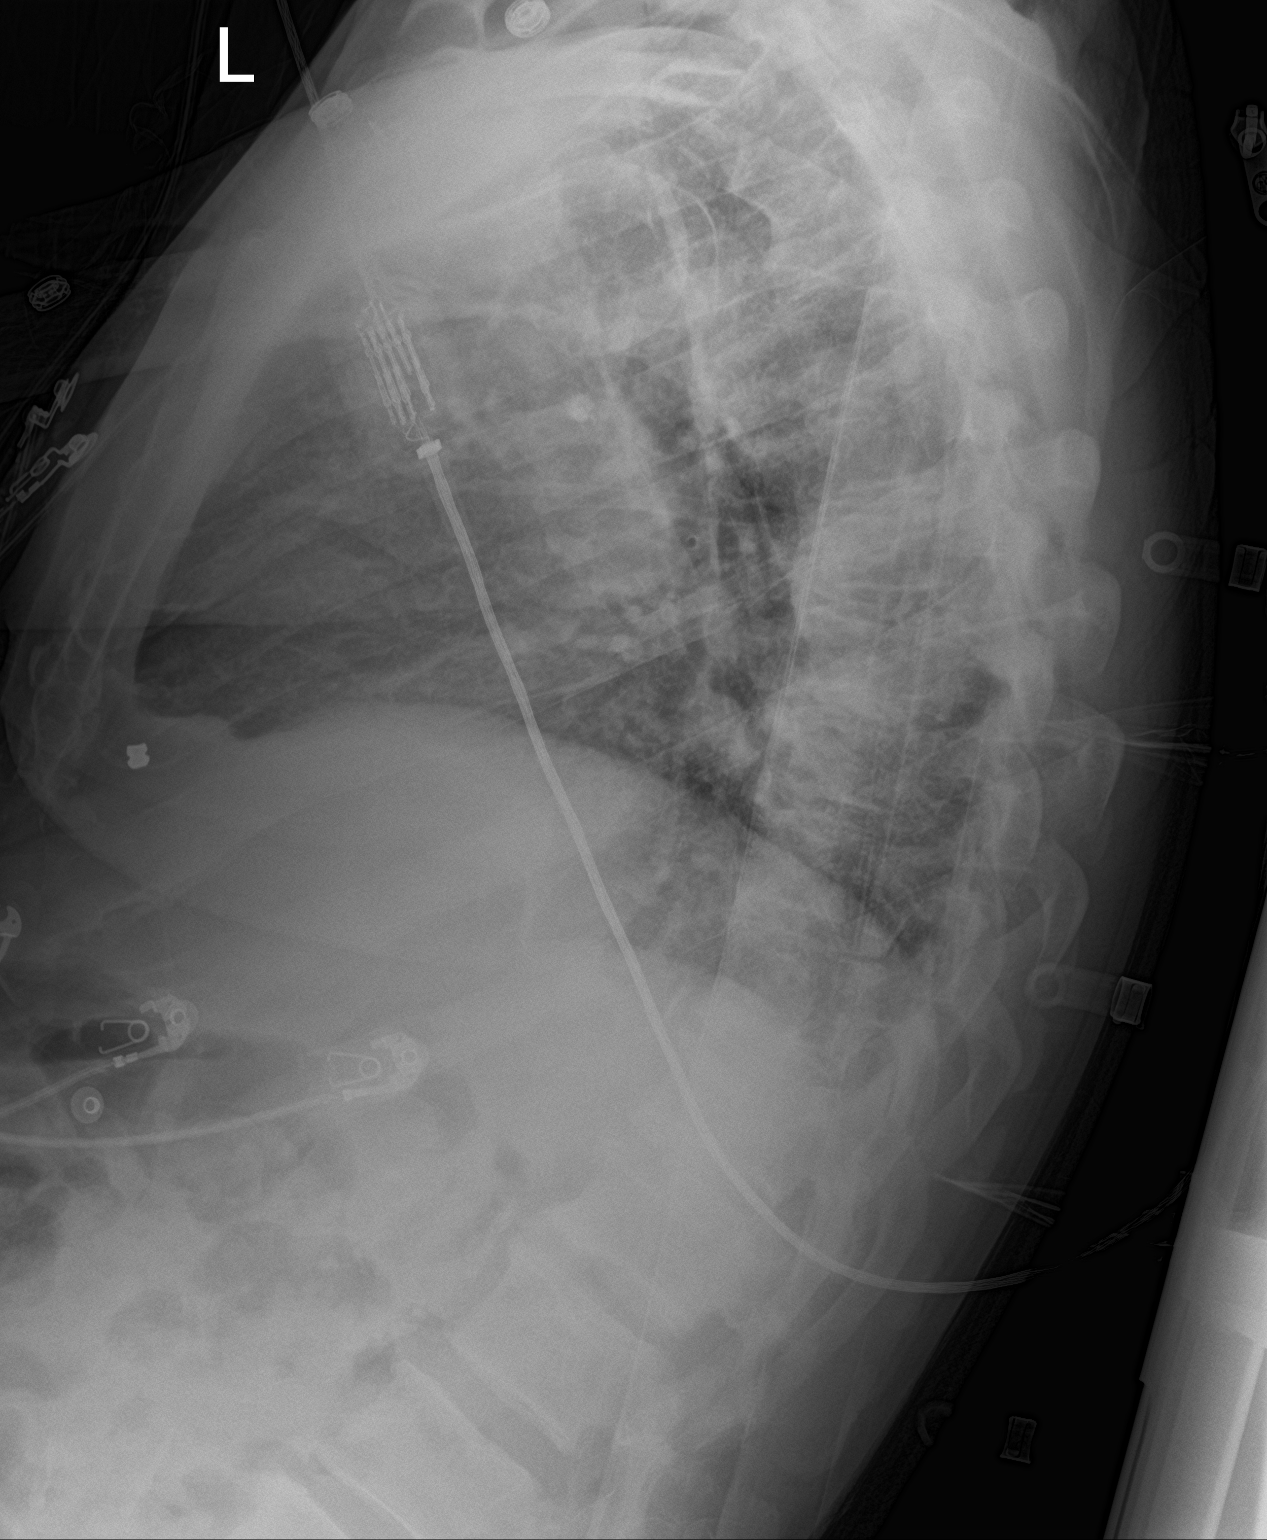

[chest ap]
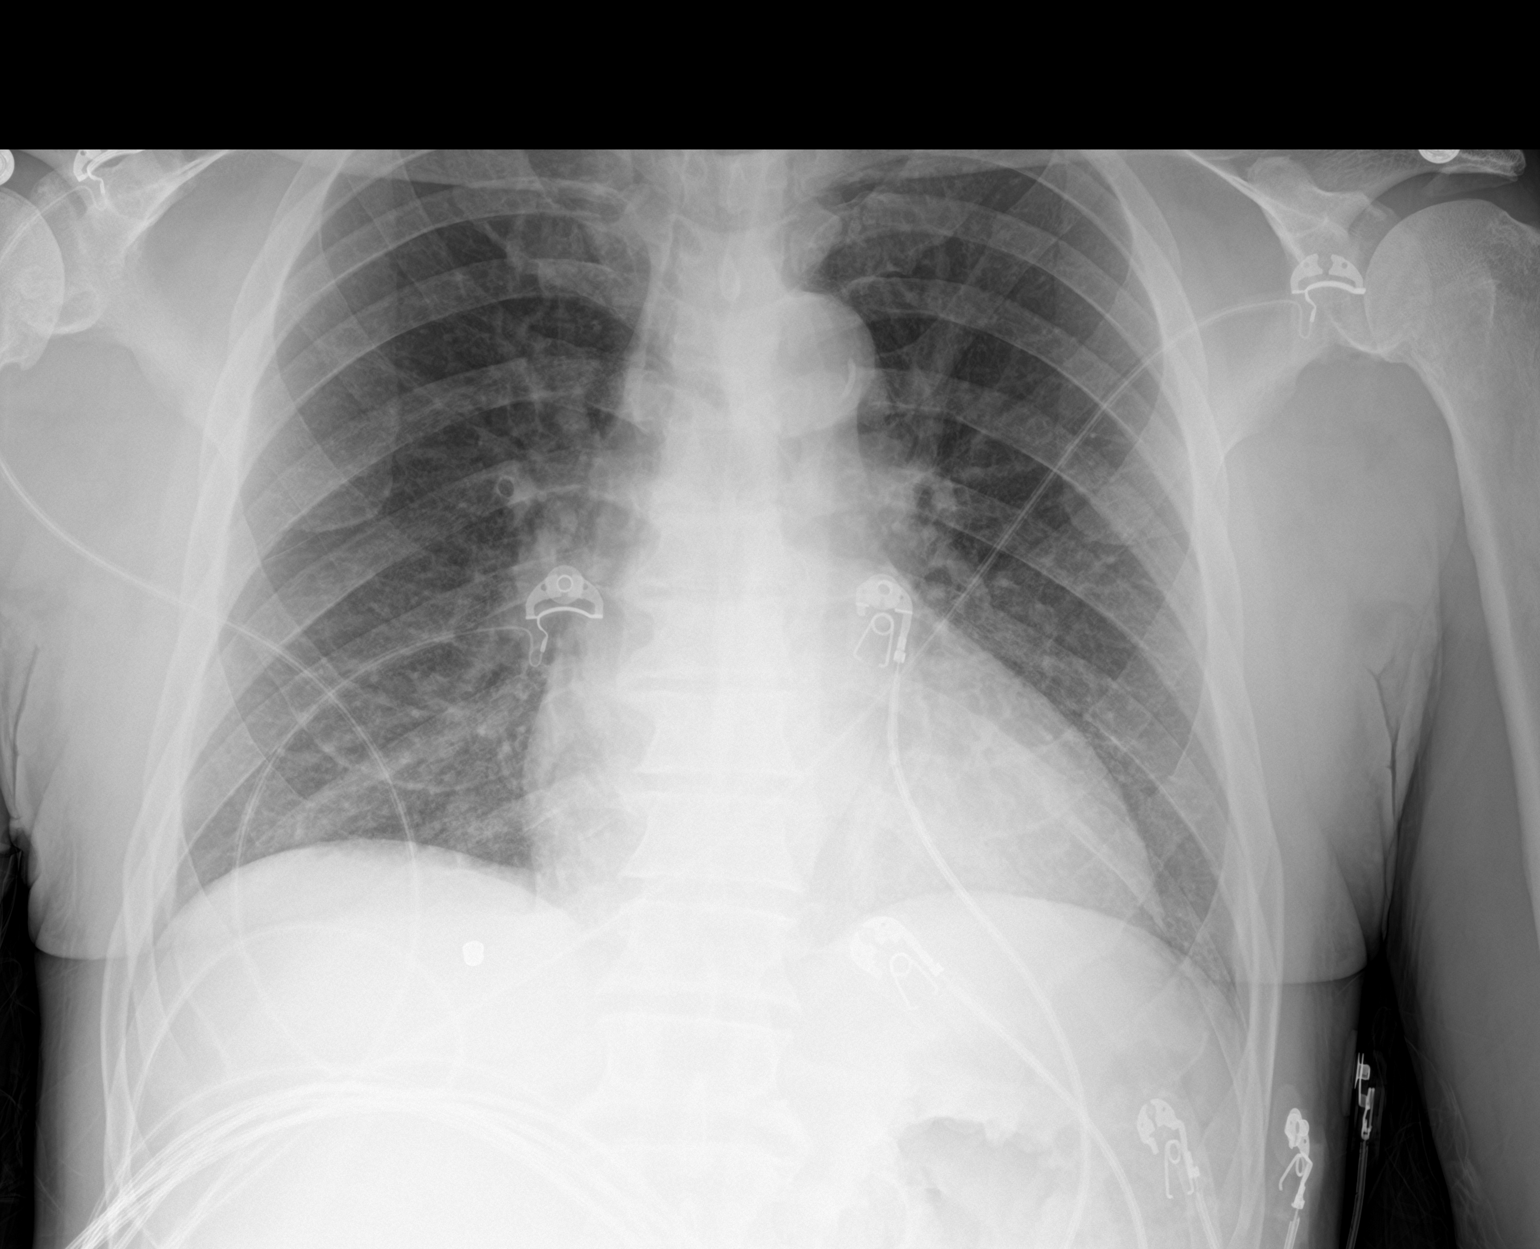

[2 of 2 positions shown; findings below may reference images not displayed]

FINDINGS: Borderline cardiomegaly with slight central vascular congestion and
bronchitic change. No definite CHF or focal pneumonia. Negative for
edema, effusion or pneumothorax. Trachea is midline. Degenerative
changes of the spine. Atherosclerosis of the aorta.
IMPRESSION: Mild cardiomegaly with vascular congestion and chronic bronchitic
change.

No definite CHF or focal pneumonia

Thoracic aortic atherosclerosis

## 2017-08-10 IMAGING — CT CT HEAD W/O CM
4 of 8 series · 15 of 47 positions shown, 16 images · non-contrast
Comparison: 07/29/2015

CLINICAL DATA: Altered mental status. History of schizophrenia,
polysubstance abuse, hypertension, hepatitis-C, diabetes, and
cocaine abuse.

EXAM:
CT HEAD WITHOUT CONTRAST
TECHNIQUE: Contiguous axial images were obtained from the base of the skull
through the vertex without intravenous contrast.

[Series 3: head bone · axial · 0.42mm/px · z∈[-75,+21]mm · 6 of 80 slices shown]
[im 7/80  bone]
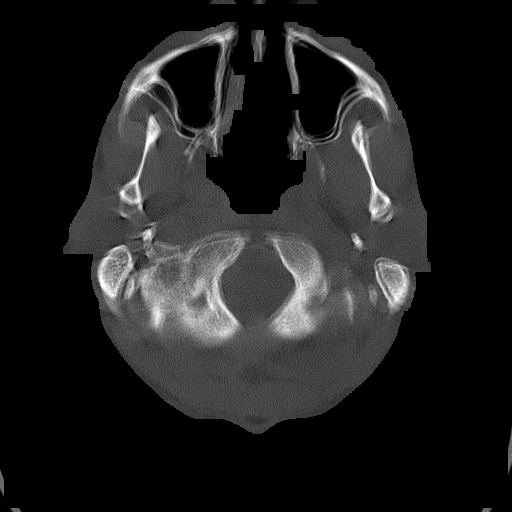
[im 19/80  bone]
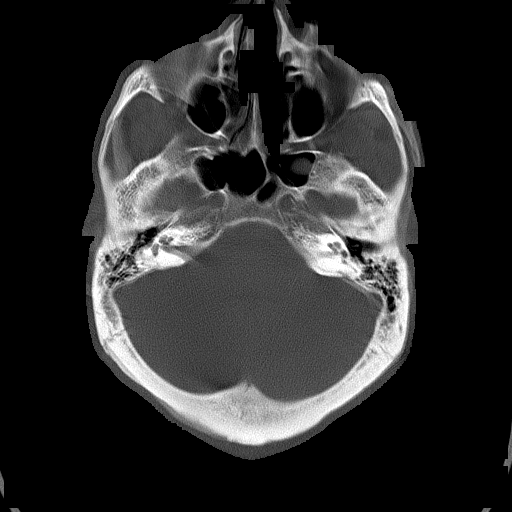
[im 25/80  bone]
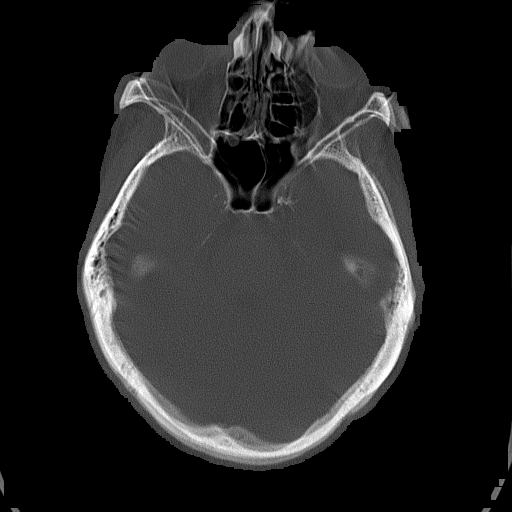
[im 37/80  bone]
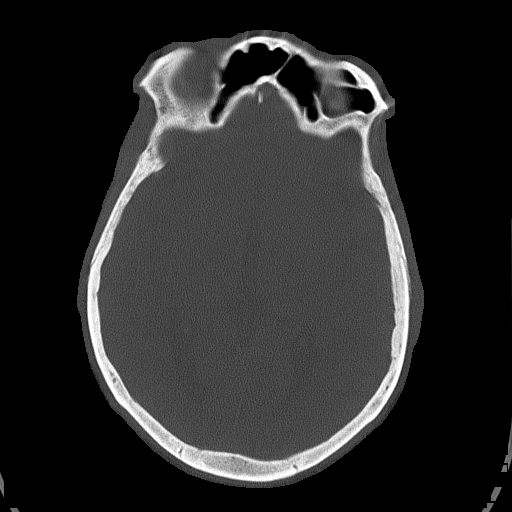
[im 43/80  bone]
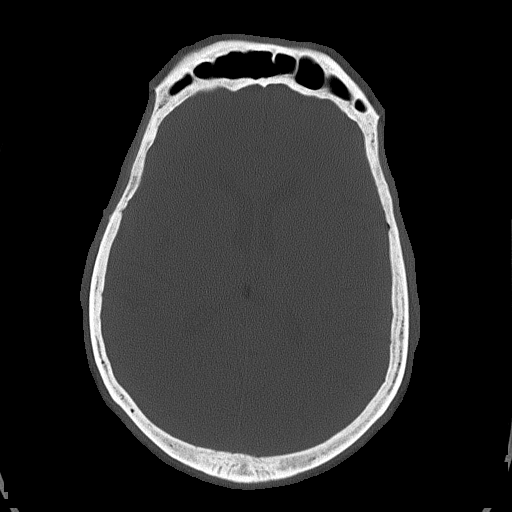
[im 55/80  bone]
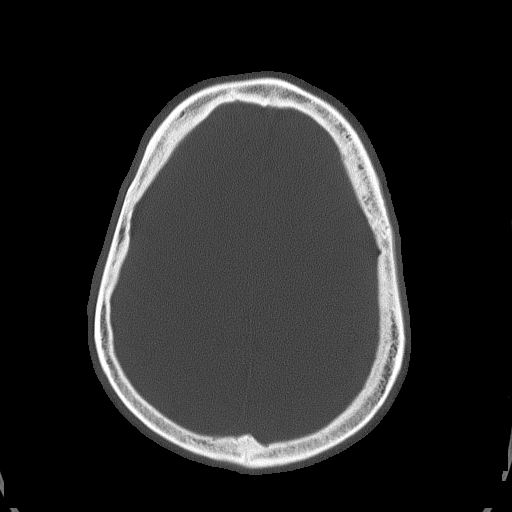

[Series 4: head without · axial · non-contrast · 0.42mm/px · z∈[-57,+33]mm · 4 of 32 slices shown, 5 images]
[im 7/32  brain]
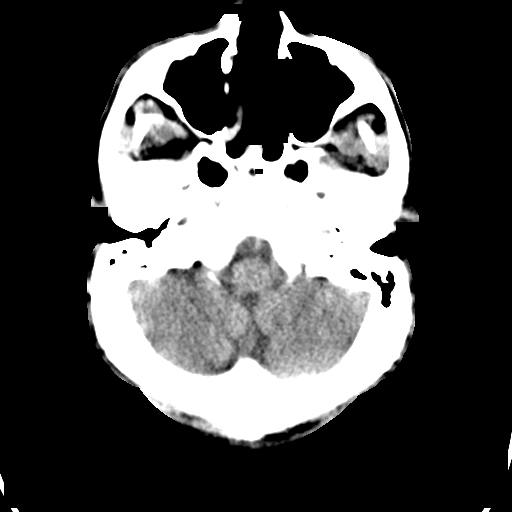
[im 7/32  bone]
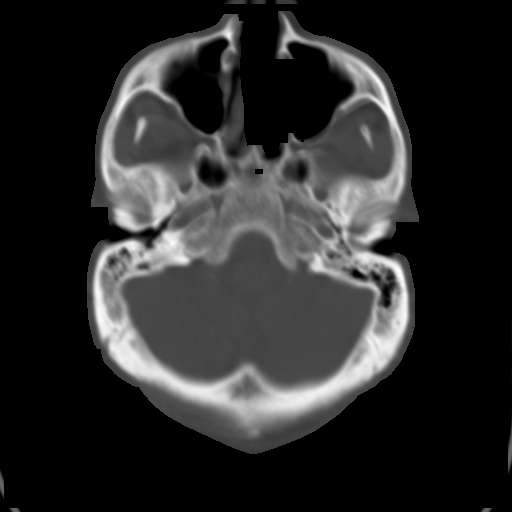
[im 13/32  brain]
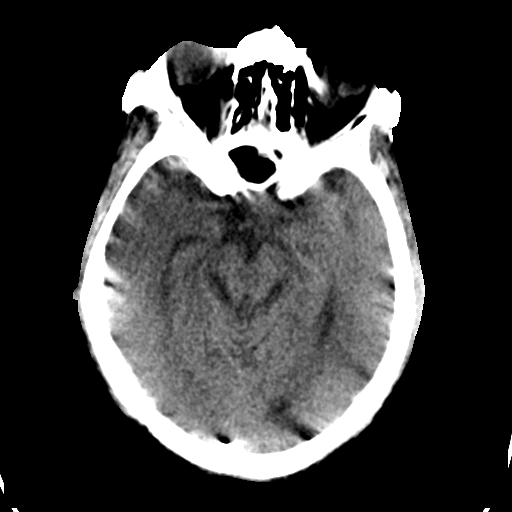
[im 19/32  brain]
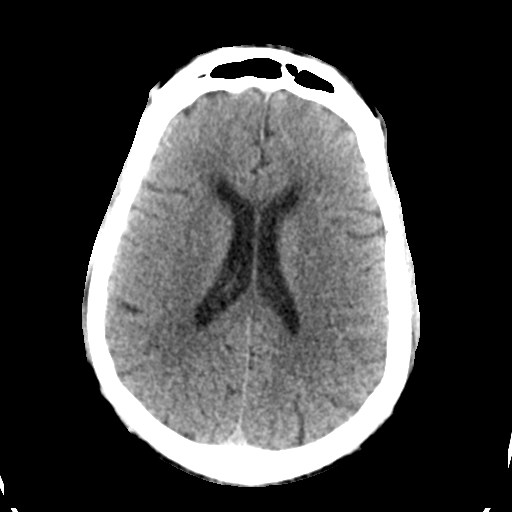
[im 25/32  brain]
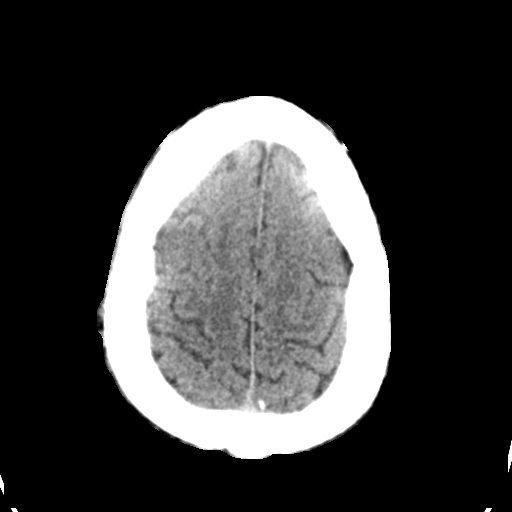

[Series 5: head without cor · coronal · non-contrast · 0.32mm/px · 3 of 71 slices shown]
[im 18/71  brain]
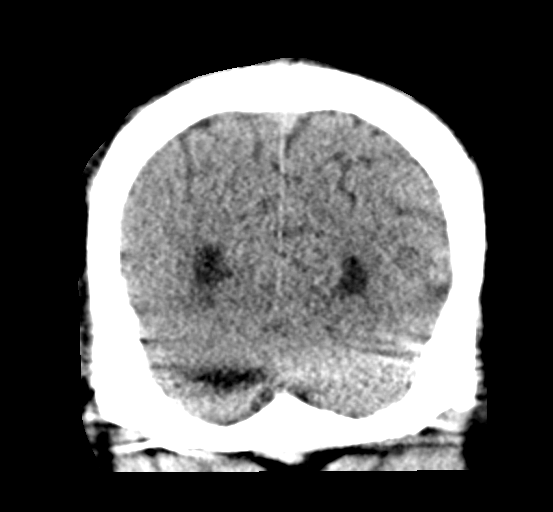
[im 27/71  brain]
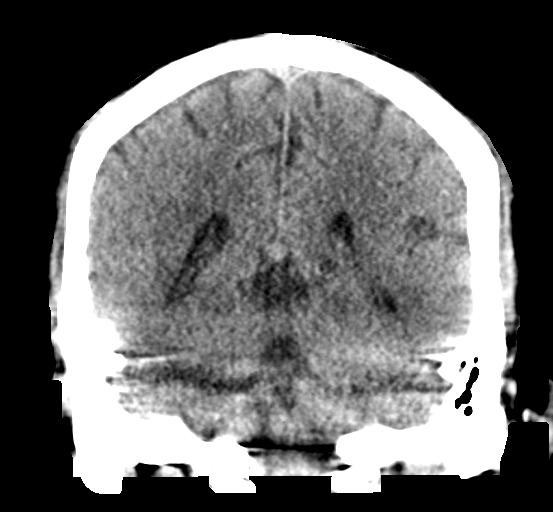
[im 36/71  brain]
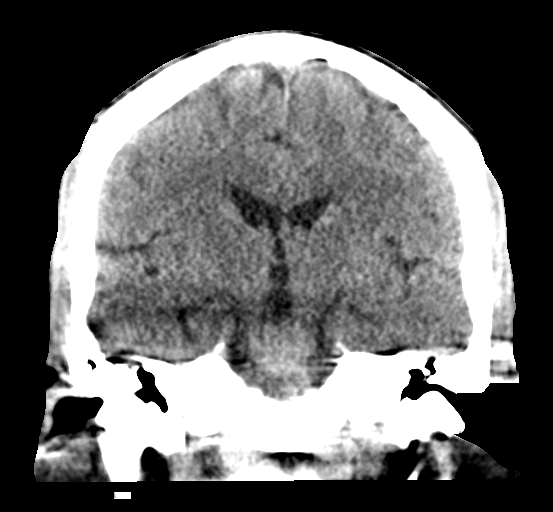

[Series 10: head without sag · sagittal · non-contrast · 0.16mm/px · 2 of 59 slices shown]
[im 20/59  brain]
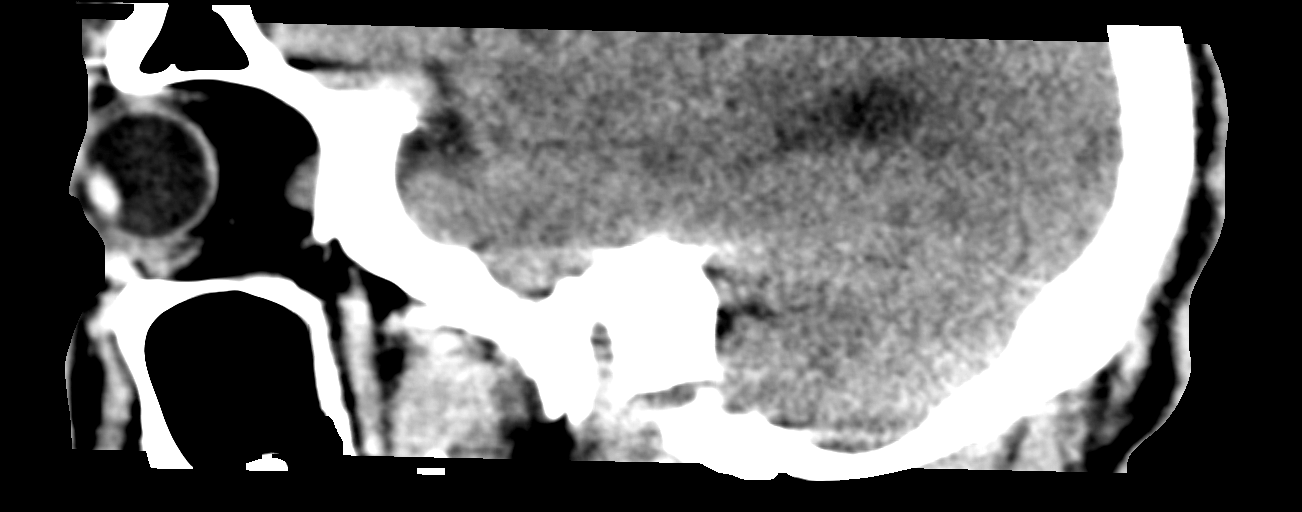
[im 39/59  brain]
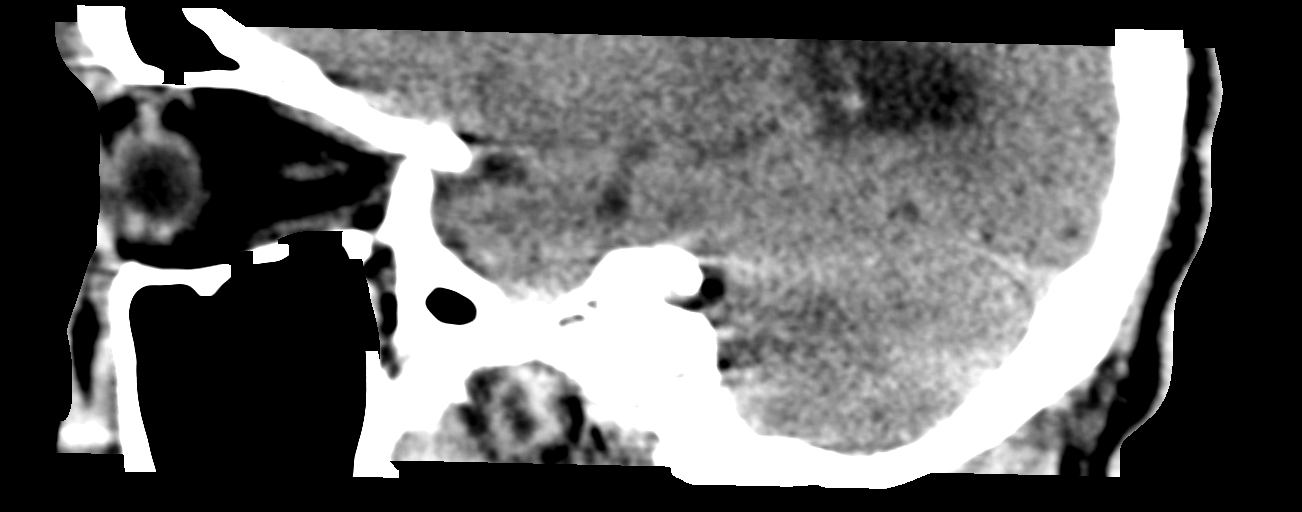

[15 of 47 positions shown; findings below may reference images not displayed]

FINDINGS: Brain: No evidence of acute infarction, hemorrhage, hydrocephalus,
extra-axial collection or mass lesion/mass effect.

Vascular: Vascular calcifications are present in the intracranial
internal carotid arteries.

Skull: Normal. Negative for fracture or focal lesion.

Sinuses/Orbits: Mild mucosal thickening in the paranasal sinuses.
Small retention cyst in the left sphenoid sinus. Mastoid air cells
are not opacified.

Other: None.
IMPRESSION: No acute intracranial abnormalities.

## 2017-09-13 ENCOUNTER — Emergency Department (HOSPITAL_COMMUNITY)
Admission: EM | Admit: 2017-09-13 | Discharge: 2017-09-14 | Disposition: A | Discharge status: Home / Self Care | Attending: Emergency Medicine | Admitting: Emergency Medicine

## 2017-09-13 ENCOUNTER — Encounter (HOSPITAL_COMMUNITY): Payer: Self-pay | Admitting: Emergency Medicine

## 2017-09-13 DIAGNOSIS — F17210 Nicotine dependence, cigarettes, uncomplicated: Secondary | ICD-10-CM | POA: Insufficient documentation

## 2017-09-13 DIAGNOSIS — I110 Hypertensive heart disease with heart failure: Secondary | ICD-10-CM | POA: Insufficient documentation

## 2017-09-13 DIAGNOSIS — F200 Paranoid schizophrenia: Secondary | ICD-10-CM | POA: Insufficient documentation

## 2017-09-13 DIAGNOSIS — I509 Heart failure, unspecified: Secondary | ICD-10-CM | POA: Insufficient documentation

## 2017-09-13 DIAGNOSIS — E119 Type 2 diabetes mellitus without complications: Secondary | ICD-10-CM | POA: Insufficient documentation

## 2017-09-13 DIAGNOSIS — Z590 Homelessness: Secondary | ICD-10-CM | POA: Insufficient documentation

## 2017-09-13 DIAGNOSIS — R45851 Suicidal ideations: Secondary | ICD-10-CM

## 2017-09-13 DIAGNOSIS — Z79899 Other long term (current) drug therapy: Secondary | ICD-10-CM | POA: Insufficient documentation

## 2017-09-13 LAB — COMPREHENSIVE METABOLIC PANEL
ALT: 19 U/L (ref 17–63)
AST: 44 U/L — ABNORMAL HIGH (ref 15–41)
Albumin: 3.7 g/dL (ref 3.5–5.0)
Alkaline Phosphatase: 101 U/L (ref 38–126)
Anion gap: 10 (ref 5–15)
BUN: 14 mg/dL (ref 6–20)
CO2: 24 mmol/L (ref 22–32)
Calcium: 9 mg/dL (ref 8.9–10.3)
Chloride: 107 mmol/L (ref 101–111)
Creatinine, Ser: 0.81 mg/dL (ref 0.61–1.24)
GFR calc Af Amer: >60 mL/min (ref >60)
GFR calc non Af Amer: >60 mL/min (ref >60)
Glucose, Bld: 177 mg/dL — ABNORMAL HIGH (ref 65–99)
Potassium: 3.9 mmol/L (ref 3.5–5.1)
Sodium: 141 mmol/L (ref 135–145)
Total Bilirubin: 0.6 mg/dL (ref 0.3–1.2)
Total Protein: 7.7 g/dL (ref 6.5–8.1)

## 2017-09-13 LAB — URINALYSIS, ROUTINE W REFLEX MICROSCOPIC
Bacteria, UA: NONE SEEN
Bilirubin Urine: NEGATIVE
Glucose, UA: NEGATIVE mg/dL
Ketones, ur: 5 mg/dL — AB
Leukocytes, UA: NEGATIVE
Nitrite: NEGATIVE
Protein, ur: >=300 mg/dL — AB
Specific Gravity, Urine: 1.028 (ref 1.005–1.030)
Squamous Epithelial / LPF: NONE SEEN
pH: 5 (ref 5.0–8.0)

## 2017-09-13 LAB — CBC
HCT: 41.7 % (ref 39.0–52.0)
Hemoglobin: 12.9 g/dL — ABNORMAL LOW (ref 13.0–17.0)
MCH: 28.6 pg (ref 26.0–34.0)
MCHC: 30.9 g/dL (ref 30.0–36.0)
MCV: 92.5 fL (ref 78.0–100.0)
Platelets: 220 10*3/uL (ref 150–400)
RBC: 4.51 MIL/uL (ref 4.22–5.81)
RDW: 13.9 % (ref 11.5–15.5)
WBC: 9.1 10*3/uL (ref 4.0–10.5)

## 2017-09-13 LAB — RAPID URINE DRUG SCREEN, HOSP PERFORMED
Amphetamines: NOT DETECTED
Barbiturates: NOT DETECTED
Benzodiazepines: NOT DETECTED
Cocaine: POSITIVE — AB
Opiates: NOT DETECTED
Tetrahydrocannabinol: NOT DETECTED

## 2017-09-13 LAB — ETHANOL: Alcohol, Ethyl (B): <10 mg/dL (ref <10)

## 2017-09-13 LAB — SALICYLATE LEVEL: Salicylate Lvl: <7 mg/dL (ref 2.8–30.0)

## 2017-09-13 LAB — ACETAMINOPHEN LEVEL: Acetaminophen (Tylenol), Serum: <10 ug/mL — ABNORMAL LOW (ref 10–30)

## 2017-09-13 MED ORDER — HYDROXYZINE HCL 50 MG PO TABS
50.0000 mg | ORAL_TABLET | Freq: Two times a day (BID) | ORAL | Status: DC
Start: 2017-09-13 — End: 2017-09-14
  Administered 2017-09-14: 50 mg via ORAL
  Filled 2017-09-13: qty 1

## 2017-09-13 MED ORDER — HYDROCHLOROTHIAZIDE 25 MG PO TABS
25.0000 mg | ORAL_TABLET | Freq: Every day | ORAL | Status: DC
  Administered 2017-09-14: 25 mg via ORAL
  Filled 2017-09-13: qty 1

## 2017-09-13 MED ORDER — IBUPROFEN 400 MG PO TABS: 600.0000 mg | ORAL_TABLET | Freq: Three times a day (TID) | ORAL | Status: DC | PRN

## 2017-09-13 MED ORDER — LISINOPRIL 20 MG PO TABS
40.0000 mg | ORAL_TABLET | Freq: Every day | ORAL | Status: DC
  Administered 2017-09-14: 40 mg via ORAL
  Filled 2017-09-13: qty 2

## 2017-09-13 MED ORDER — ONDANSETRON HCL 4 MG PO TABS: 4.0000 mg | ORAL_TABLET | Freq: Three times a day (TID) | ORAL | Status: DC | PRN

## 2017-09-13 NOTE — ED Notes (Signed)
Patient wanded by security. 

## 2017-09-13 NOTE — ED Triage Notes (Signed)
Per EMS:  Patient presents to ED for assessment of suicidal thoughts, chills and urinary incontinence.  Patient recently released from jail, currently living on the streets.  Patient states he has just been peeing on himself recently.  Patient SI with a plan (to cut or shoot himself).

## 2017-09-13 NOTE — ED Notes (Signed)
Patient's belongings placed in locker 1, Pod F.

## 2017-09-13 NOTE — ED Notes (Signed)
Accidentally clicked off rectal temp. Same has not been done.

## 2017-09-13 NOTE — BH Assessment (Signed)
Received TTS consult request. Pt is very drowsy and unable to stay awake. He is unable to answer any questions at this time. [facility] staff will contact TTS at [phone] when Pt is able to participate in assessment.   Harlin RainFord Ellis Patsy BaltimoreWarrick Jr, LPC, [facility], [facility] Triage Specialist [phone]

## 2017-09-13 NOTE — ED Notes (Signed)
[place]'s [place]

## 2017-09-13 NOTE — ED Notes (Signed)
Staffing made aware of need for sitter 

## 2017-09-13 NOTE — ED Notes (Signed)
BH attempted TTS. Pt very sleepy at this time. Cannot wake up enough to speak with BH. Will call them back when pt is able to speak with staff.
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
## 2017-09-13 NOTE — ED Notes (Signed)
Patient denies pain and is resting comfortably.  

## 2017-09-14 ENCOUNTER — Other Ambulatory Visit: Payer: Self-pay

## 2017-09-14 MED ORDER — ACETAMINOPHEN 325 MG PO TABS
650.0000 mg | ORAL_TABLET | Freq: Once | ORAL | Status: AC
Start: 2017-09-14 — End: 2017-09-14
  Administered 2017-09-14: 650 mg via ORAL

## 2017-09-14 NOTE — ED Notes (Signed)
Tylenol given for temp as ordered by Dr Corlis LeakMackuen.

## 2017-09-14 NOTE — Care Management (Signed)
ED CM attempted to contact [facility] Clydie BraunKaren [phone] concerning patient being cleared for discharge no answer and unable to leave VM. Contacted Cousin Candice who has agreed to pick him up after church today. Becky RN on [place] [employer] updated

## 2017-09-14 NOTE — ED Notes (Signed)
TTS being performed.  

## 2017-09-14 NOTE — BH Assessment (Signed)
Tele Assessment Note   Patient Name: [name] MRN: [id number] Referring Physician: Dr. Juleen China Location of Patient: [facility] Location of Provider: Behavioral Health TTS Department  [name] is an 57 y.o. male. Pt denies SI/HI. Pt reports seeing angels. Pt denies command hallucinations. Pt states he sees the angels occasionally. Pt states he came to the hospital because he was recently released from prison and he is homeless and not getting sleep. Pt states he wanted to get sleep at the hospital. The Pt states he would like to have a FL2 so that he can be placed in an assisted living program. Pt reports having issues with his diabetes. Pt states he has not had crack cocaine since he was placed in prison.   Inetta Fermo, NP recommends D/C and SW consult to assist with housing.   Diagnosis:  F20.9 Schizophrenia  Past Medical History:  Past Medical History:  Diagnosis Date  . CHF (congestive heart failure) (HCC)   . Chronic foot pain   . Cocaine abuse (HCC)   . Diabetes mellitus without complication (HCC)   . Hepatitis C   . Homelessness   . Homelessness   . Hypertension   . Neuropathy   . Polysubstance abuse (HCC)   . Schizophrenia (HCC)     History reviewed. No pertinent surgical history.  Family History:  Family History  Problem Relation Age of Onset  . Hypertension Other   . Diabetes Other     Social History:  reports that he has been smoking cigarettes.  He has a 20.00 pack-year smoking history. He uses smokeless tobacco. He reports that he drinks alcohol. He reports that he uses drugs. Drugs: "Crack" cocaine, Cocaine, and Marijuana. Frequency: 7.00 times per week.  Additional Social History:  Alcohol / Drug Use Pain Medications: please see mar Prescriptions: please see mar Over the Counter: please see mar History of alcohol / drug use?: No history of alcohol / drug abuse Longest period of sobriety (when/how long): NA  CIWA: CIWA-Ar BP: 139/81 Pulse Rate:  (!) 107 COWS: Clinical Opiate Withdrawal Scale (COWS) Resting Pulse Rate: Pulse Rate 81-100 Sweating: No report of chills or flushing Restlessness: Able to sit still Pupil Size: Pupils pinned or normal size for room light Bone or Joint Aches: Not present Runny Nose or Tearing: Not present GI Upset: No GI symptoms Tremor: No tremor Yawning: No yawning Anxiety or Irritability: None Gooseflesh Skin: Skin is smooth COWS Total Score: 1  Allergies:  Allergies  Allergen Reactions  . Haldol [Haloperidol] Other (See Comments)    Muscle spasms, loss of voluntary movement. However, pt has taken Thorazine on multiple occasions with no adverse effects.     Home Medications:  (Not in a hospital admission)  OB/GYN Status:  No LMP for male patient.  General Assessment Data Assessment unable to be completed: Yes Reason for not completing assessment: Pt too drowsy to answer questions even with RN prompting Pt. Location of Assessment: [facility] ED TTS Assessment: In system Is this a Tele or Face-to-Face Assessment?: Tele Assessment Is this an Initial Assessment or a Re-assessment for this encounter?: Initial Assessment Marital status: Single Maiden name: Na Is patient pregnant?: No Pregnancy Status: No Living Arrangements: Other (Comment)(homeless) Can pt return to current living arrangement?: No Admission Status: Voluntary Is patient capable of signing voluntary admission?: Yes Referral Source: Self/Family/Friend Insurance type: Medicaid     Crisis Care Plan Living Arrangements: Other (Comment)(homeless) Legal Guardian: Other:(self) Name of Psychiatrist: NA Name of Therapist: NA  Education  Status Is patient currently in school?: No Is the patient employed, unemployed or receiving disability?: Unemployed  Risk to self with the past 6 months Suicidal Ideation: No Has patient been a risk to self within the past 6 months prior to admission? : No Suicidal Intent: No Has patient had any  suicidal intent within the past 6 months prior to admission? : No Is patient at risk for suicide?: No Suicidal Plan?: No Has patient had any suicidal plan within the past 6 months prior to admission? : No Access to Means: No What has been your use of drugs/alcohol within the last 12 months?: NA Previous Attempts/Gestures: No How many times?: 0 Other Self Harm Risks: NA Triggers for Past Attempts: None known Intentional Self Injurious Behavior: None Family Suicide History: No Recent stressful life event(s): Other (Comment)(homelessness) Persecutory voices/beliefs?: No Depression: Yes Depression Symptoms: Loss of interest in usual pleasures, Feeling angry/irritable Substance abuse history and/or treatment for substance abuse?: Yes Suicide prevention information given to non-admitted patients: Not applicable  Risk to Others within the past 6 months Homicidal Ideation: No Does patient have any lifetime risk of violence toward others beyond the six months prior to admission? : No Thoughts of Harm to Others: No Current Homicidal Intent: No Current Homicidal Plan: No Access to Homicidal Means: No Identified Victim: NA History of harm to others?: No Assessment of Violence: None Noted Violent Behavior Description: NA Does patient have access to weapons?: No Criminal Charges Pending?: No Does patient have a court date: No Is patient on probation?: No  Psychosis Hallucinations: Auditory Delusions: None noted  Mental Status Report Appearance/Hygiene: Unremarkable Eye Contact: Fair Motor Activity: Freedom of movement Speech: Logical/coherent Level of Consciousness: Alert Mood: Euthymic Affect: Appropriate to circumstance Anxiety Level: Minimal Thought Processes: Coherent, Relevant Judgement: Unimpaired Orientation: Person, Place, Time, Situation Obsessive Compulsive Thoughts/Behaviors: None  Cognitive Functioning Concentration: Normal Memory: Recent Intact, Remote Intact Is  patient IDD: No Is patient DD?: No Insight: Fair Impulse Control: Fair Appetite: Fair Have you had any weight changes? : No Change Sleep: Decreased Total Hours of Sleep: 6 Vegetative Symptoms: None  ADLScreening [facility] Assessment Services) Patient's cognitive ability adequate to safely complete daily activities?: Yes Patient able to express need for assistance with ADLs?: Yes Independently performs ADLs?: Yes (appropriate for developmental age)  Prior Inpatient Therapy Prior Inpatient Therapy: Yes Prior Therapy Dates: Pt states 2 years ago Prior Therapy Facilty/Provider(s): [facility] Reason for Treatment: depression  Prior Outpatient Therapy Prior Outpatient Therapy: No Does patient have an ACCT team?: No Does patient have Intensive In-House Services?  : No Does patient have [name] services? : No Does patient have P4CC services?: No  ADL Screening (condition at time of admission) Patient's cognitive ability adequate to safely complete daily activities?: Yes Is the patient deaf or have difficulty hearing?: No Does the patient have difficulty seeing, even when wearing glasses/contacts?: No Does the patient have difficulty concentrating, remembering, or making decisions?: No Patient able to express need for assistance with ADLs?: Yes Does the patient have difficulty dressing or bathing?: No Independently performs ADLs?: Yes (appropriate for developmental age) Does the patient have difficulty walking or climbing stairs?: No       Abuse/Neglect Assessment (Assessment to be complete while patient is alone) Abuse/Neglect Assessment Can Be Completed: Yes Physical Abuse: Denies Verbal Abuse: Denies Sexual Abuse: Denies Exploitation of patient/patient's resources: Denies     Merchant navy officer (For Healthcare) Does Patient Have a Medical Advance Directive?: No Would patient like information on creating a  medical advance directive?: No - Patient declined    Additional  Information 1:1 In Past 12 Months?: No CIRT Risk: No Elopement Risk: No Does patient have medical clearance?: Yes     Disposition:  Disposition Initial Assessment Completed for this Encounter: Yes  This service was provided via telemedicine using a 2-way, interactive audio and video technology.  Names of all persons participating in this telemedicine service and their role in this encounter. Name: Leighton Ruffina Okonkwo Role: NP  Name:  Role:   Name:  Role:   Name: Role:     Emmit PomfretLevette,Daneya Hartgrove D 09/14/2017 7:56 AM

## 2017-09-14 NOTE — ED Notes (Signed)
Dr Corlis LeakMackuen aware of pt's temp. Pt appears nad.

## 2017-09-14 NOTE — ED Notes (Signed)
Per Burna MortimerWanda, CM, sister coming to pick up pt after church. Pt noted w/incont of stool and partial in bathroom. Pt in shower at this time.

## 2017-09-14 NOTE — ED Notes (Signed)
Pt aware his sister has arrived to pick him up. Pt changed into his personal clothing.

## 2017-09-18 ENCOUNTER — Emergency Department (HOSPITAL_COMMUNITY)
Admission: EM | Admit: 2017-09-18 | Discharge: 2017-09-18 | Disposition: A | Discharge status: Home / Self Care | Payer: Self-pay | Attending: Emergency Medicine | Admitting: Emergency Medicine

## 2017-09-18 ENCOUNTER — Encounter (HOSPITAL_COMMUNITY): Payer: Self-pay

## 2017-09-18 DIAGNOSIS — I509 Heart failure, unspecified: Secondary | ICD-10-CM | POA: Insufficient documentation

## 2017-09-18 DIAGNOSIS — I10 Essential (primary) hypertension: Secondary | ICD-10-CM | POA: Insufficient documentation

## 2017-09-18 DIAGNOSIS — F1414 Cocaine abuse with cocaine-induced mood disorder: Secondary | ICD-10-CM | POA: Diagnosis present

## 2017-09-18 DIAGNOSIS — R45851 Suicidal ideations: Secondary | ICD-10-CM | POA: Insufficient documentation

## 2017-09-18 DIAGNOSIS — F17210 Nicotine dependence, cigarettes, uncomplicated: Secondary | ICD-10-CM | POA: Insufficient documentation

## 2017-09-18 DIAGNOSIS — E119 Type 2 diabetes mellitus without complications: Secondary | ICD-10-CM | POA: Insufficient documentation

## 2017-09-18 DIAGNOSIS — Z79899 Other long term (current) drug therapy: Secondary | ICD-10-CM | POA: Insufficient documentation

## 2017-09-18 LAB — COMPREHENSIVE METABOLIC PANEL
ALT: 16 U/L — ABNORMAL LOW (ref 17–63)
AST: 26 U/L (ref 15–41)
Albumin: 3.7 g/dL (ref 3.5–5.0)
Alkaline Phosphatase: 92 U/L (ref 38–126)
Anion gap: 12 (ref 5–15)
BILIRUBIN TOTAL: 0.3 mg/dL (ref 0.3–1.2)
BUN: 10 mg/dL (ref 6–20)
CO2: 27 mmol/L (ref 22–32)
Calcium: 9.2 mg/dL (ref 8.9–10.3)
Chloride: 105 mmol/L (ref 101–111)
Creatinine, Ser: 0.75 mg/dL (ref 0.61–1.24)
Glucose, Bld: 130 mg/dL — ABNORMAL HIGH (ref 65–99)
POTASSIUM: 3 mmol/L — AB (ref 3.5–5.1)
Sodium: 144 mmol/L (ref 135–145)
TOTAL PROTEIN: 7.6 g/dL (ref 6.5–8.1)

## 2017-09-18 LAB — CBC
HCT: 39.7 % (ref 39.0–52.0)
Hemoglobin: 12.7 g/dL — ABNORMAL LOW (ref 13.0–17.0)
MCH: 29.1 pg (ref 26.0–34.0)
MCHC: 32 g/dL (ref 30.0–36.0)
MCV: 91.1 fL (ref 78.0–100.0)
PLATELETS: 229 10*3/uL (ref 150–400)
RBC: 4.36 MIL/uL (ref 4.22–5.81)
RDW: 13.6 % (ref 11.5–15.5)
WBC: 5.5 10*3/uL (ref 4.0–10.5)

## 2017-09-18 LAB — RAPID URINE DRUG SCREEN, HOSP PERFORMED
Amphetamines: NOT DETECTED
BENZODIAZEPINES: NOT DETECTED
Barbiturates: NOT DETECTED
Cocaine: POSITIVE — AB
Opiates: NOT DETECTED
Tetrahydrocannabinol: POSITIVE — AB

## 2017-09-18 LAB — ETHANOL

## 2017-09-18 LAB — SALICYLATE LEVEL

## 2017-09-18 LAB — ACETAMINOPHEN LEVEL: Acetaminophen (Tylenol), Serum: <10 ug/mL — ABNORMAL LOW (ref 10–30)

## 2017-09-18 MED ORDER — POTASSIUM CHLORIDE CRYS ER 20 MEQ PO TBCR
40.0000 meq | EXTENDED_RELEASE_TABLET | Freq: Once | ORAL | Status: AC
  Administered 2017-09-18: 40 meq via ORAL
  Filled 2017-09-18: qty 2

## 2017-09-18 MED ORDER — ACETAMINOPHEN 325 MG PO TABS
650.0000 mg | ORAL_TABLET | Freq: Once | ORAL | Status: AC
  Administered 2017-09-18: 650 mg via ORAL
  Filled 2017-09-18: qty 2

## 2017-09-18 NOTE — BH Assessment (Signed)
Tele Assessment Note   Patient Name: [name] MRN: [id number] Referring Physician: Roxy HorsemanBrowning, Robert, PA-C Location of Patient: [facility] Location of Provider: Behavioral Health TTS Department  [name] is an 57 y.o. male who presented to [facility] with chief complaint of suicidal thoughts. Patient presently denies suicidal / homicidal ideations, visual / auditory hallucinations. Patient report he was recently released from prison and he is homeless. Per EDP note patient expressed to [employer] he was suicidal so he could be seen in the ER for his blood pressure.  He states that he does not have a plan on how he would kill himself.  He denies any other associated symptoms. Patient UDS's positive for cocaine and THC.  Disposition: Dr. Sharma CovertNorman & Shaune PollackLord, DNP, patient recommend for discharge with consult with peer support.      Diagnosis: F20.9  Schizophrenia    Past Medical History:  Past Medical History:  Diagnosis Date  . CHF (congestive heart failure) (HCC)   . Chronic foot pain   . Cocaine abuse (HCC)   . Diabetes mellitus without complication (HCC)   . Hepatitis C   . Homelessness   . Homelessness   . Hypertension   . Neuropathy   . Polysubstance abuse (HCC)   . Schizophrenia (HCC)     History reviewed. No pertinent surgical history.  Family History:  Family History  Problem Relation Age of Onset  . Hypertension Other   . Diabetes Other     Social History:  reports that he has been smoking cigarettes.  He has a 20.00 pack-year smoking history. He uses smokeless tobacco. He reports that he drinks alcohol. He reports that he has current or past drug history. Drugs: "Crack" cocaine, Cocaine, and Marijuana. Frequency: 7.00 times per week.  Additional Social History:  Alcohol / Drug Use Pain Medications: please see mar Prescriptions: please see mar Over the Counter: please see mar History of alcohol / drug use?: Yes Longest period of sobriety (when/how  long): NA Substance #1 Name of Substance 1: cocaine 1 - Age of First Use: unknown 1 - Amount (size/oz): unknown 1 - Frequency: several time weekly 1 - Duration: ongoing 1 - Last Use / Amount: couple days ago Substance #2 Name of Substance 2: marijuana 2 - Age of First Use: UNKNOWN 2 - Amount (size/oz): UNKNOWN 2 - Frequency: several time weekly 2 - Duration: ONGOING 2 - Last Use / Amount: couple days ago  CIWA: CIWA-Ar BP: (!) 154/93 Pulse Rate: 98 COWS:    Allergies:  Allergies  Allergen Reactions  . Haldol [Haloperidol] Other (See Comments)    Muscle spasms, loss of voluntary movement. However, pt has taken Thorazine on multiple occasions with no adverse effects.     Home Medications:  (Not in a hospital admission)  OB/GYN Status:  No LMP for male patient.  General Assessment Data Location of Assessment: [facility] TTS Assessment: In system Is this a Tele or Face-to-Face Assessment?: Face-to-Face Is this an Initial Assessment or a Re-assessment for this encounter?: Initial Assessment Marital status: Single Maiden name: n/a Living Arrangements: Other (Comment)(homeless) Can pt return to current living arrangement?: Yes Admission Status: Voluntary Is patient capable of signing voluntary admission?: Yes Referral Source: Self/Family/Friend Insurance type: self-pay     Crisis Care Plan Living Arrangements: Other (Comment)(homeless) Legal Guardian: Other:(self) Name of Psychiatrist: none report Name of Therapist: none report  Education Status Is patient currently in school?: No Is the patient employed, unemployed or receiving disability?: Unemployed  Risk  to self with the past 6 months Suicidal Ideation: No Has patient been a risk to self within the past 6 months prior to admission? : No Suicidal Intent: No Has patient had any suicidal intent within the past 6 months prior to admission? : No Is patient at risk for suicide?: No Suicidal Plan?: No Has patient  had any suicidal plan within the past 6 months prior to admission? : No Access to Means: No What has been your use of drugs/alcohol within the last 12 months?: cocaine and THC(UDS's positive for cocaine & THC) Previous Attempts/Gestures: No How many times?: 0 Other Self Harm Risks: n/a Triggers for Past Attempts: None known Intentional Self Injurious Behavior: None Family Suicide History: No Recent stressful life event(s): Other (Comment)(homelessness) Persecutory voices/beliefs?: No Depression: Yes Depression Symptoms: Loss of interest in usual pleasures, Feeling angry/irritable Substance abuse history and/or treatment for substance abuse?: Yes Suicide prevention information given to non-admitted patients: Not applicable  Risk to Others within the past 6 months Homicidal Ideation: No Does patient have any lifetime risk of violence toward others beyond the six months prior to admission? : No Thoughts of Harm to Others: No Current Homicidal Intent: No Current Homicidal Plan: No Access to Homicidal Means: No Identified Victim: N/A History of harm to others?: No Assessment of Violence: None Noted Violent Behavior Description: N/A Does patient have access to weapons?: No Criminal Charges Pending?: No Does patient have a court date: No Is patient on probation?: No  Psychosis Hallucinations: Auditory Delusions: None noted  Mental Status Report Appearance/Hygiene: In scrubs Eye Contact: Fair Motor Activity: Freedom of movement Speech: Logical/coherent Level of Consciousness: Alert Mood: (Flat) Affect: Appropriate to circumstance Anxiety Level: None Thought Processes: Coherent, Relevant Judgement: Unimpaired Orientation: Person, Place, Time, Situation Obsessive Compulsive Thoughts/Behaviors: None  Cognitive Functioning Concentration: Normal Memory: Recent Intact, Remote Intact Is patient IDD: No Is patient DD?: No Insight: Fair Impulse Control: Fair Appetite:  Fair Have you had any weight changes? : No Change Sleep: Decreased Total Hours of Sleep: 6 Vegetative Symptoms: None  ADLScreening [facility] Assessment Services) Patient's cognitive ability adequate to safely complete daily activities?: Yes Patient able to express need for assistance with ADLs?: Yes Independently performs ADLs?: Yes (appropriate for developmental age)  Prior Inpatient Therapy Prior Inpatient Therapy: Yes Prior Therapy Dates: Pt states 2 years ago Prior Therapy Facilty/Provider(s): [facility] Reason for Treatment: depression  Prior Outpatient Therapy Prior Outpatient Therapy: No Does patient have an ACCT team?: Unknown Does patient have Intensive In-House Services?  : No Does patient have [name] services? : No Does patient have P4CC services?: No  ADL Screening (condition at time of admission) Patient's cognitive ability adequate to safely complete daily activities?: Yes Is the patient deaf or have difficulty hearing?: No Does the patient have difficulty seeing, even when wearing glasses/contacts?: No Does the patient have difficulty concentrating, remembering, or making decisions?: No Patient able to express need for assistance with ADLs?: Yes Does the patient have difficulty dressing or bathing?: No Independently performs ADLs?: Yes (appropriate for developmental age) Does the patient have difficulty walking or climbing stairs?: No       Abuse/Neglect Assessment (Assessment to be complete while patient is alone) Abuse/Neglect Assessment Can Be Completed: Yes Physical Abuse: Denies Verbal Abuse: Denies Sexual Abuse: Denies Exploitation of patient/patient's resources: Denies Self-Neglect: Denies     Merchant navy officer (For Healthcare) Does Patient Have a Medical Advance Directive?: No Would patient like information on creating a medical advance directive?: No - Patient declined  Additional Information 1:1 In Past 12 Months?: No CIRT Risk: No Elopement  Risk: No Does patient have medical clearance?: No     Disposition:  Disposition Initial Assessment Completed for this Encounter: Yes Disposition of Patient: Discharge Patient refused recommended treatment: No Mode of transportation if patient is discharged?: Other   Jermany Sundell [facility] 09/18/2017 9:17 AM

## 2017-09-18 NOTE — ED Notes (Signed)
Peer support at bedside 

## 2017-09-18 NOTE — ED Notes (Signed)
Pt d/c home per MD order. Discharge summary and resources reviewed with pt. Pt uninterested, refused to take discharge summary and resource paperwork. Pt denies SI/HI/AVH. Person[employer] property returned to pt. Pt ambulatory off unit.

## 2017-09-18 NOTE — Clinical Social Work Note (Signed)
Clinical Social Work Assessment  Patient Details  Name: [name] MRN: [id number] Date of Birth: [birth year]  Date of referral:  09/18/17               Reason for consult:  Housing Concerns/Homelessness                Permission sought to share information with:    Permission granted to share information::     Name::        Agency::     Relationship::     Contact Information:     Housing/Transportation Living arrangements for the past 2 months:  Homeless Company secretary) Source of Information:  Patient Patient Interpreter Needed:  None Criminal Activity/Legal Involvement Pertinent to Current Situation/Hospitalization:    Significant Relationships:  None Lives with:  Self Do you feel safe going back to the place where you live?    Need for family participation in patient care:     Care giving concerns: IVC papers state "Respondent presents as depressed and psychotic. He endorses suicidal ideations without a plan and visual hallucinations of "[place] people".   Patient also expressed concerns of homelessness.    Social Worker assessment / plan: CSW intern met with patient via bedside to discuss housing concerns. Patient told this Probation officer that he has recently been to [employer], but is interested in [employer]. CSW intern validated his interests for [employer] and left shelter resources as well as a buss pass with patients nurse.   CSW intern assessed for supports outside of the hospital, however patient informed this Probation officer that he has no supports currently. Patient does collect SSI and was observed to be motivated to reach out to several of the shelters provided to him.  CSW intern has completed assessment, and per Dr. Mariea Clonts, "this patient does not require psychiatric hospitalization at this time."   Employment status:  Retired Insurance underwriter information:  Other (Comment Required)(None) PT Recommendations:  Not assessed at this time Information /  Referral to community resources:  Shelter  Patient/Family's Response to care:  Patient was appreciative to care provided by the ED team, and is motivated to utilize resources provided to him.  Patient/Family's Understanding of and Emotional Response to Diagnosis, Current Treatment, and Prognosis:  Patient is understanding of current interventions set in place as well as current diagnosis.  Emotional Assessment Appearance:  Appears stated age Attitude/Demeanor/Rapport:    Affect (typically observed):  Accepting, Calm, Pleasant Orientation:  Oriented to Self, Oriented to Situation, Oriented to Place, Oriented to  Time Alcohol / Substance use:    Psych involvement (Current and /or in the community):  Yes (Comment)  Discharge Needs  Concerns to be addressed:  Homelessness Readmission within the last 30 days:  No Current discharge risk:  None Barriers to Discharge:  No Barriers Identified   Willeen Niece, Student-Social Work 09/18/2017, 10:21 AM

## 2017-09-18 NOTE — ED Notes (Signed)
[facility] Expected date:  Expected time:  Means of arrival:  Comments: 

## 2017-09-18 NOTE — ED Triage Notes (Signed)
Pt presents via police under IVC papers. IVC papers state "Respondent presents as depressed and psychotic. He endorses suicidal ideations without a plan and visual hallucinations of "[place] people". Respondent says he threw out his medications, abilify and tegretol, 4 days prior, and he went on a self-described "crack binge". Respondent is a danger to himself."

## 2017-09-18 NOTE — BH Assessment (Signed)
[facility] Progress [facility]  Per Juanetta BeetsJacqueline Norman, DO, this pt does not require psychiatric hospitalization at this time.  Pt [facility], which Dr Sharma CovertNorman has rescinded.  [facility].  This has been included in pt's discharge instructions.  Pt would also benefit from seeing Peer Support Specialists; they will be asked to speak to pt.  Pt's nurse, Morrie Sheldonshley, has been notified.  Doylene Canninghomas Torrie Lafavor, MA Triage Specialist [phone]

## 2017-09-18 NOTE — ED Notes (Signed)
Pt wanded by security. 

## 2017-09-18 NOTE — Discharge Instructions (Addendum)
For your mental health needs, you are advised to follow up with [name].  New and returning patients are seen at their walk-in clinic.  Walk-in hours are Monday - Friday from 8:00 am - 3:00 pm.  Walk-in patients are seen on a first come, first served basis.  Try to arrive as early as possible for he best chance of being seen the same day: ° °     [name] °     [address] °     , [place] [address] °     [phone] °

## 2017-09-18 NOTE — [facility] Suicide Risk Assessment (Signed)
Suicide Risk Assessment  Dis[facility]   [facility]   Principal Problem: Cocaine abuse with cocaine-induced mood disorder [facility](HCC) Discharge Diagnoses:  Patient Active Problem List   Diagnosis Date Noted  . Cocaine abuse with cocaine-induced mood disorder [facility](HCC) [F14.14] 09/18/2017    Priority: High  . Schizophrenia, paranoid type (HCC) [F20.0] 01/17/2015    Priority: High  . Drug hallucinosis [facility](HCC) [F19.951] 10/08/2014    Priority: High  . Chronic paranoid schizophrenia (HCC) [F20.0] 09/07/2014    Priority: High  . Cocaine use disorder, severe, dependence (HCC) [F14.20]     Priority: High  . Cocaine abuse (HCC) [F14.10] 03/10/2014    Priority: High  . Chronic foot pain [M79.673, G89.29]   . Schizoaffective disorder, bipolar type (HCC) [F25.0] 09/30/2016  . Suicidal thoughts [R45.851]   . Substance induced mood disorder (HCC) [F19.94] 03/13/2015  . Acute kidney failure (HCC) [N17.9] 01/26/2015  . Suicidal ideation [R45.851]   . Substance or medication-induced bipolar and related disorder with onset during intoxication (HCC) [Z61.096[F19.929, F19.94] 08/10/2014  . Acute CHF (congestive heart failure) (HCC) [I50.9] 07/29/2014  . Essential hypertension, benign [I10] 03/28/2013  . Diabetes mellitus (HCC) [E11.9] 03/15/2013    Total Time spent with patient: 45 minutes  Musculoskeletal: Strength & Muscle Tone: within normal limits Gait & Station: normal Patient leans: N/A  Psychiatric Specialty Exam:   Blood pressure (!) 154/93, pulse 98, temperature 98.6 F (37 C), temperature source Oral, resp. rate 18, height 5' 8.5" (1.74 m), weight 81.6 kg (180 lb), SpO2 98 %.Body mass index is 26.97 kg/m.  General Appearance: Casual  Eye Contact::  Good  Speech:  Normal Rate409  Volume:  Normal  Mood:  Euthymic  Affect:  Congruent  Thought Process:  Coherent and Descriptions of Associations: Intact  Orientation:  Full (Time, Place, and Person)  Thought  Content:  WDL and Logical  Suicidal Thoughts:  No  Homicidal Thoughts:  No  Memory:  Immediate;   Good Recent;   Good Remote;   Good  Judgement:  Fair  Insight:  Fair  Psychomotor Activity:  Normal  Concentration:  Good  Recall:  Good  Fund of Knowledge:Fair  Language: Good  Akathisia:  No  Handed:  Right  AIMS (if indicated):     Assets:  Leisure Time Physical Health Resilience  Sleep:     Cognition: WNL  ADL's:  Intact   Mental Status Per Nursing Assessment::   On Admission:   Cocaine abuse with suicidal ideations.  On assessment, he denies suicidal/homicidal ideations,hallucinations, and withdrawal symptoms.  He wants shelter resources.  Pleasantly watching television and eating.  He is well known to these providers and is at his baseline.  Stable for discharge.  Demographic Factors:  Male  Loss Factors: NA  Historical Factors: NA  Risk Reduction Factors:   Sense of responsibility to family and Positive social support  Continued Clinical Symptoms:  None   Cognitive Features That Contribute To Risk:  None    Suicide Risk:  Minimal: No identifiable suicidal ideation.  Patients presenting with no risk factors but with morbid ruminations; may be classified as minimal risk based on the severity of the depressive symptoms    Plan Of Care/Follow-up recommendations:  Activity:  as tolerated Diet:  heart healthy diet  Zavian Slowey, NP 09/18/2017, 8:50 AM

## 2017-09-18 NOTE — ED Notes (Signed)
Patient reports SI without a plan and [facility] seeing [place] people. Patient denies HI/AH. Plan of care discussed. Encouragement and support provided and safety maintain. Q 15 min safety checks in place and video monitoring.

## 2017-09-18 NOTE — Patient Outreach (Signed)
ED Peer Support Specialist Patient Intake (Complete at intake & 30-60 Day Follow-up)  Name: [name]  MRN: [id number]  Age: 57 y.o.   Date of Admission: 09/18/2017  Intake: Initial Comments:      Primary Reason Admitted: is an 57 y.o. male who presented to [facility] with chief complaint of suicidal thoughts. (57) Patient presently denies suicidal / homicidal ideations, visual / auditory hallucinations. Patient report he was recently released from prison and he is homeless. Per EDP note patient expressed to [employer] he was suicidal so he could be seen in the ER for his blood pressure.  He states that he does not have a plan on how he would kill himself. He denies any other associated symptoms. Patient UDS's positive for cocaine and THC.    Lab values: Alcohol/ETOH: Positive Positive UDS? No Amphetamines: No Barbiturates: No Benzodiazepines: No Cocaine: Yes Opiates: No Cannabinoids: Yes  Demographic information: Gender: Male Ethnicity: African American Marital Status: Separated Insurance Status: Medicare Receives non-medical governmental assistance ([employer]/Welfare, food stamps, etc.: Yes Lives with: Other (comment) Living situation: Homeless  Reported Patient History: Patient reported health conditions: Bipolar disorder Patient aware of HIV and hepatitis status: No  In past year, has patient visited ED for any reason? No  Number of ED visits:    Reason(s) for visit:    In past year, has patient been hospitalized for any reason? No  Number of hospitalizations:    Reason(s) for hospitalization:    In past year, has patient been arrested? No  Number of arrests:    Reason(s) for arrest:    In past year, has patient been incarcerated? No  Number of incarcerations:    Reason(s) for incarceration:    In past year, has patient received medication-assisted treatment?    In past year, patient received the following treatments: Other (comment)([name])  In past  year, has patient received any harm reduction services? No  Did this include any of the following?    In past year, has patient received care from a mental health provider for diagnosis other than SUD? No  In past year, is this first time patient has overdosed? No  Number of past overdoses:    In past year, is this first time patient has been hospitalized for an overdose? No  Number of hospitalizations for overdose(s):    Is patient currently receiving treatment for a mental health diagnosis? No  Patient reports experiencing difficulty participating in SUD treatment: No    Most important reason(s) for this difficulty?    Has patient received prior services for treatment? Yes  In past, patient has received services from following agencies: [facility]  Plan of Care:  Suggested follow up at these agencies/treatment centers: [facility]), [facility] )  Other information: CPSS met with Pt and was able to gain information about how he is doing and to monitor service. CPSS dicussed different pathways to recovery. CPSS asked Pt what his goals where and what he want to gain from this ER visit. CPSS was made aware that he was getting SSI and needed assistance with housing. CPSS was able to get Pt set up with [facility] which they will assist him with housing. CPSS left contact information for Pt to follow up with CPSS.     , CPSS  09/18/2017 12:39 PM          

## 2017-09-18 NOTE — ED Provider Notes (Signed)
[facility]-EMERGENCY DEPT Provider Note   CSN: [id number] Arrival date & time: 09/18/17  0427     History   Chief Complaint Chief Complaint  Patient presents with  . IVC  . Suicidal    HPI [name] is a 57 y.o. male.  Patient presents to the emergency department with chief complaint of suicidal thoughts.  He states that he claimed that he was suicidal earlier and was brought to the emergency department under IVC by [employer].  Patient now states that he is not suicidal, but really just wanted to be seen for his blood pressure.  He states that he does not have a plan on how he would kill himself.  He denies any other associated symptoms.  There are no modifying factors.  The history is provided by the patient. No language interpreter was used.    Past Medical History:  Diagnosis Date  . CHF (congestive heart failure) (HCC)   . Chronic foot pain   . Cocaine abuse (HCC)   . Diabetes mellitus without complication (HCC)   . Hepatitis C   . Homelessness   . Homelessness   . Hypertension   . Neuropathy   . Polysubstance abuse (HCC)   . Schizophrenia [facility](HCC)     Patient Active Problem List   Diagnosis Date Noted  . Chronic foot pain   . Schizoaffective disorder, bipolar type (HCC) 09/30/2016  . Suicidal thoughts   . Substance induced mood disorder (HCC) 03/13/2015  . Acute kidney failure (HCC) 01/26/2015  . Schizophrenia, paranoid type (HCC) 01/17/2015  . Suicidal ideation   . Drug hallucinosis (HCC) 10/08/2014  . Chronic paranoid schizophrenia (HCC) 09/07/2014  . 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 Acute CHF (congestive heart failure) (HCC) 07/29/2014  . Cocaine use disorder, severe, dependence (HCC)   . Cocaine abuse (HCC) 03/10/2014  . Essential hypertension, benign 03/28/2013  . Diabetes mellitus (HCC) 03/15/2013    History reviewed. No pertinent surgical  history.     Home Medications    Prior to Admission medications   Medication Sig Start Date End Date Taking? Authorizing Provider  ARIPiprazole (ABILIFY) 5 MG tablet Take 5 mg by mouth daily.   Yes [provider]  benztropine (COGENTIN) 1 MG tablet Take 1 mg by mouth daily.   Yes [provider]  carbamazepine (TEGRETOL) 100 MG chewable tablet Chew 100 mg by mouth daily.   Yes [provider]  hydrochlorothiazide (HYDRODIURIL) 25 MG tablet Take 25 mg by mouth daily.   Yes [provider]  hydrOXYzine (ATARAX/VISTARIL) 50 MG tablet Take 50 mg by mouth 2 (two) times daily.   Yes [provider]  lisinopril (PRINIVIL,ZESTRIL) 40 MG tablet Take 40 mg by mouth daily.   Yes [provider]    Family History Family History  Problem Relation Age of Onset  . Hypertension Other   . Diabetes Other     Social History Social History   Tobacco Use  . Smoking status: Current Every Day Smoker    Packs/day: 1.00    Years: 20.00    Pack years: 20.00    Types: Cigarettes  . Smokeless tobacco: Current User  Substance Use Topics  . Alcohol use: Yes    Comment: Daily Drinker   . Drug use: Yes    Frequency: 7.0 times per week    Types: "Crack" cocaine, Cocaine, Marijuana    Comment: Cocaine tonight, Marijuana "a long  time"     Allergies   Haldol [haloperidol]   Review of Systems Review of Systems  All other systems reviewed and are negative.    Physical Exam Updated Vital Signs BP (!) 154/93 (BP Location: Left Arm)   Pulse 98   Temp 98.6 F (37 C) (Oral)   Resp 18   Ht 5' 8.5" (1.74 m)   Wt 81.6 kg (180 lb)   SpO2 98%   BMI 26.97 kg/m   Physical Exam  Constitutional: He is oriented to person, place, and time. He appears well-developed and well-nourished.  HENT:  Head: Normocephalic and atraumatic.  Eyes: Pupils are equal, round, and reactive to light. Conjunctivae and EOM are normal. Right eye exhibits no  discharge. Left eye exhibits no discharge. No scleral icterus.  Neck: Normal range of motion. Neck supple. No JVD present.  Cardiovascular: Normal rate, regular rhythm and normal heart sounds. Exam reveals no gallop and no friction rub.  No murmur heard. Pulmonary/Chest: Effort normal and breath sounds normal. No respiratory distress. He has no wheezes. He has no rales. He exhibits no tenderness.  Abdominal: Soft. He exhibits no distension and no mass. There is no tenderness. There is no rebound and no guarding.  Musculoskeletal: Normal range of motion. He exhibits no edema or tenderness.  Neurological: He is alert and oriented to person, place, and time.  Skin: Skin is warm and dry.  Psychiatric: He has a normal mood and affect. His behavior is normal. Judgment and thought content normal.  Nursing note and vitals reviewed.    ED Treatments / Results  Labs (all labs ordered are listed, but only abnormal results are displayed) Labs Reviewed  CBC - Abnormal; Notable for the following components:      Result Value   Hemoglobin 12.7 (*)    All other components within normal limits  COMPREHENSIVE METABOLIC PANEL  ETHANOL  SALICYLATE LEVEL  ACETAMINOPHEN LEVEL  RAPID URINE DRUG SCREEN, HOSP PERFORMED    EKG  EKG Interpretation None       Radiology No results found.  Procedures Procedures (including critical care time)  Medications Ordered in ED Medications - No data to display   Initial Impression / Assessment and Plan / ED Course  I have reviewed the triage vital signs and the nursing notes.  Pertinent labs & imaging results that were available during my care of the patient were reviewed by me and considered in my medical decision making (see chart for details).     Patient brought to the ED under IVC for suicidal ideation.  Laboratory workup is pending.  Pending normal labs, patient is medically clear for TTS evaluation.  K is a little low at 3.0.  Will give oral K  here.  Final Clinical Impressions(s) / ED Diagnoses   Final diagnoses:  Suicidal ideation    ED Discharge Orders    None       Roxy Horseman, PA-C 09/18/17 0631    Dione Booze, MD 09/18/17 [phone]

## 2017-09-20 ENCOUNTER — Encounter (HOSPITAL_COMMUNITY): Payer: Self-pay | Admitting: Emergency Medicine

## 2017-09-20 DIAGNOSIS — F1410 Cocaine abuse, uncomplicated: Secondary | ICD-10-CM | POA: Insufficient documentation

## 2017-09-20 DIAGNOSIS — E1140 Type 2 diabetes mellitus with diabetic neuropathy, unspecified: Secondary | ICD-10-CM | POA: Insufficient documentation

## 2017-09-20 DIAGNOSIS — R45851 Suicidal ideations: Secondary | ICD-10-CM | POA: Insufficient documentation

## 2017-09-20 DIAGNOSIS — Z79899 Other long term (current) drug therapy: Secondary | ICD-10-CM | POA: Insufficient documentation

## 2017-09-20 DIAGNOSIS — F17210 Nicotine dependence, cigarettes, uncomplicated: Secondary | ICD-10-CM | POA: Insufficient documentation

## 2017-09-20 DIAGNOSIS — F209 Schizophrenia, unspecified: Secondary | ICD-10-CM | POA: Insufficient documentation

## 2017-09-20 DIAGNOSIS — I110 Hypertensive heart disease with heart failure: Secondary | ICD-10-CM | POA: Insufficient documentation

## 2017-09-20 DIAGNOSIS — Z590 Homelessness: Secondary | ICD-10-CM | POA: Insufficient documentation

## 2017-09-20 DIAGNOSIS — I509 Heart failure, unspecified: Secondary | ICD-10-CM | POA: Insufficient documentation

## 2017-09-20 NOTE — ED Triage Notes (Signed)
Pt arrives via EMS for suicidal ideation. [facility]. Homeless.

## 2017-09-21 ENCOUNTER — Emergency Department (HOSPITAL_COMMUNITY)
Admission: EM | Admit: 2017-09-21 | Discharge: 2017-09-21 | Disposition: A | Discharge status: Home / Self Care | Payer: Self-pay | Attending: Emergency Medicine | Admitting: Emergency Medicine

## 2017-09-21 DIAGNOSIS — F1410 Cocaine abuse, uncomplicated: Secondary | ICD-10-CM

## 2017-09-21 DIAGNOSIS — Z5900 Homelessness unspecified: Secondary | ICD-10-CM

## 2017-09-21 DIAGNOSIS — F209 Schizophrenia, unspecified: Secondary | ICD-10-CM

## 2017-09-21 LAB — COMPREHENSIVE METABOLIC PANEL
ALBUMIN: 3.5 g/dL (ref 3.5–5.0)
ALT: 13 U/L — ABNORMAL LOW (ref 17–63)
AST: 23 U/L (ref 15–41)
Alkaline Phosphatase: 89 U/L (ref 38–126)
Anion gap: 11 (ref 5–15)
BUN: 15 mg/dL (ref 6–20)
CHLORIDE: 104 mmol/L (ref 101–111)
CO2: 26 mmol/L (ref 22–32)
Calcium: 9.5 mg/dL (ref 8.9–10.3)
Creatinine, Ser: 0.97 mg/dL (ref 0.61–1.24)
GFR calc Af Amer: >60 mL/min (ref >60)
GLUCOSE: 191 mg/dL — AB (ref 65–99)
POTASSIUM: 3.2 mmol/L — AB (ref 3.5–5.1)
SODIUM: 141 mmol/L (ref 135–145)
Total Bilirubin: 0.6 mg/dL (ref 0.3–1.2)
Total Protein: 6.8 g/dL (ref 6.5–8.1)

## 2017-09-21 LAB — RAPID URINE DRUG SCREEN, HOSP PERFORMED
AMPHETAMINES: NOT DETECTED
BENZODIAZEPINES: NOT DETECTED
Barbiturates: NOT DETECTED
Cocaine: POSITIVE — AB
Opiates: NOT DETECTED
Tetrahydrocannabinol: POSITIVE — AB

## 2017-09-21 LAB — CBC
HCT: 38.1 % — ABNORMAL LOW (ref 39.0–52.0)
Hemoglobin: 12.2 g/dL — ABNORMAL LOW (ref 13.0–17.0)
MCH: 28.8 pg (ref 26.0–34.0)
MCHC: 32 g/dL (ref 30.0–36.0)
MCV: 89.9 fL (ref 78.0–100.0)
PLATELETS: 260 10*3/uL (ref 150–400)
RBC: 4.24 MIL/uL (ref 4.22–5.81)
RDW: 13.7 % (ref 11.5–15.5)
WBC: 8.7 10*3/uL (ref 4.0–10.5)

## 2017-09-21 LAB — ACETAMINOPHEN LEVEL: Acetaminophen (Tylenol), Serum: <10 ug/mL — ABNORMAL LOW (ref 10–30)

## 2017-09-21 LAB — SALICYLATE LEVEL: Salicylate Lvl: <7 mg/dL (ref 2.8–30.0)

## 2017-09-21 LAB — ETHANOL

## 2017-09-21 MED ORDER — ACETAMINOPHEN 325 MG PO TABS
650.0000 mg | ORAL_TABLET | Freq: Once | ORAL | Status: DC
  Filled 2017-09-21: qty 2

## 2017-09-21 NOTE — ED Notes (Signed)
Pt became angry, verbally abusive to staff.  RN attempted to give tylenol per order however refused.  Pt was escorted off the premises.

## 2017-09-21 NOTE — ED Notes (Signed)
Pt became

## 2017-09-21 NOTE — ED Provider Notes (Signed)
[facility] EMERGENCY DEPARTMENT Provider Note   CSN: [id number] Arrival date & time: 09/20/17  2132     History   Chief Complaint Chief Complaint  Patient presents with  . Suicidal    HPI JAVAUN DIMPERIO is a 57 y.o. male.  HPI Pt with schizophrenia and ongoing cocaine abuse presenting to the ER requesting and "FL2".  States he is homeless and has no where to go. Reports if unable to be placed in a group home then he will state he is suicidal so he can be placed at "[facility]".  Released from prison. Requesting to stay until "sunrise".   Recently seen in ER for same and evaluated by [facility] 3 days ago and felt stable for dc at that time. Resources given at that time but pt did not take them with him. No suicidal plan. No other complaints   Past Medical History:  Diagnosis Date  . CHF (congestive heart failure) (HCC)   . Chronic foot pain   . Cocaine abuse (HCC)   . Diabetes mellitus without complication (HCC)   . Hepatitis C   . Homelessness   . Homelessness   . Hypertension   . Neuropathy   . Polysubstance abuse (HCC)   . Schizophrenia [facility])     Patient Active Problem List   Diagnosis Date Noted  . Cocaine abuse with cocaine-induced mood disorder (HCC) 09/18/2017  . Chronic foot pain   . Schizoaffective disorder, bipolar type (HCC) 09/30/2016  . Suicidal thoughts   . Substance induced mood disorder (HCC) 03/13/2015  . Acute kidney failure (HCC) 01/26/2015  . Schizophrenia, paranoid type (HCC) 01/17/2015  . Suicidal ideation   . Drug hallucinosis (HCC) 10/08/2014  . Chronic paranoid schizophrenia (HCC) 09/07/2014  . 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 Acute CHF (congestive heart failure) (HCC) 07/29/2014  . Cocaine use disorder, severe, dependence (HCC)   . Cocaine abuse (HCC) 03/10/2014  . Essential hypertension, benign 03/28/2013  . Diabetes mellitus  (HCC) 03/15/2013    History reviewed. No pertinent surgical history.      Home Medications    Prior to Admission medications   Medication Sig Start Date End Date Taking? Authorizing Provider  ARIPiprazole (ABILIFY) 5 MG tablet Take 5 mg by mouth daily.    [provider]  benztropine (COGENTIN) 1 MG tablet Take 1 mg by mouth daily.    [provider]  carbamazepine (TEGRETOL) 100 MG chewable tablet Chew 100 mg by mouth daily.    [provider]  hydrochlorothiazide (HYDRODIURIL) 25 MG tablet Take 25 mg by mouth daily.    [provider]  hydrOXYzine (ATARAX/VISTARIL) 50 MG tablet Take 50 mg by mouth 2 (two) times daily.    [provider]  lisinopril (PRINIVIL,ZESTRIL) 40 MG tablet Take 40 mg by mouth daily.    [provider]    Family History Family History  Problem Relation Age of Onset  . Hypertension Other   . Diabetes Other     Social History Social History   Tobacco Use  . Smoking status: Current Every Day Smoker    Packs/day: 1.00    Years: 20.00    Pack years: 20.00    Types: Cigarettes  . Smokeless tobacco: Current User  Substance Use Topics  . Alcohol use: Yes    Comment: Daily Drinker   . Drug use: Yes    Frequency: 7.0 times per week  Types: "Crack" cocaine, Cocaine, Marijuana    Comment: Cocaine tonight, Marijuana "a long time"     Allergies   Haldol [haloperidol]   Review of Systems Review of Systems  All other systems reviewed and are negative.    Physical Exam Updated Vital Signs BP (!) 171/104   Pulse (!) 108   Temp 98.5 F (36.9 C) (Oral)   Resp 16   Ht 5' 8.5" (1.74 m)   Wt 81.6 kg (180 lb)   SpO2 97%   BMI 26.97 kg/m   Physical Exam  Constitutional: He is oriented to person, place, and time. He appears well-developed and well-nourished.  HENT:  Head: Normocephalic and atraumatic.  Eyes: EOM are normal.  Neck: Normal range of motion.  Cardiovascular: Normal rate,  regular rhythm, normal heart sounds and intact distal pulses.  Pulmonary/Chest: Effort normal and breath sounds normal. No respiratory distress.  Abdominal: Soft. He exhibits no distension. There is no tenderness.  Musculoskeletal: Normal range of motion.  Neurological: He is alert and oriented to person, place, and time.  Skin: Skin is warm and dry.  Psychiatric: He has a normal mood and affect. Judgment normal.  Nursing note and vitals reviewed.    ED Treatments / Results  Labs (all labs ordered are listed, but only abnormal results are displayed) Labs Reviewed  CBC - Abnormal; Notable for the following components:      Result Value   Hemoglobin 12.2 (*)    HCT 38.1 (*)    All other components within normal limits  COMPREHENSIVE METABOLIC PANEL  ETHANOL  SALICYLATE LEVEL  ACETAMINOPHEN LEVEL  RAPID URINE DRUG SCREEN, HOSP PERFORMED    EKG None  Radiology No results found.  Procedures Procedures (including critical care time)  Medications Ordered in ED Medications  acetaminophen (TYLENOL) tablet 650 mg (has no administration in time range)     Initial Impression / Assessment and Plan / ED Course  I have reviewed the triage vital signs and the nursing notes.  Pertinent labs & imaging results that were available during my care of the patient were reviewed by me and considered in my medical decision making (see chart for details).     No indication for IVC or psych placement. Medically stable. Outpatient resources given again to the patient. Told he can stay in the waiting room until sunrise. Pt requesting a tylenol  Final Clinical Impressions(s) / ED Diagnoses   Final diagnoses:  None    ED Discharge Orders    None       Azalia Bilisampos, Amelia Burgard, MD 09/21/17 [phone]

## 2017-09-22 ENCOUNTER — Encounter (HOSPITAL_COMMUNITY): Payer: Self-pay | Admitting: Emergency Medicine

## 2017-09-22 ENCOUNTER — Other Ambulatory Visit: Payer: Self-pay

## 2017-09-22 ENCOUNTER — Emergency Department (HOSPITAL_COMMUNITY): Payer: Self-pay

## 2017-09-22 ENCOUNTER — Emergency Department (HOSPITAL_COMMUNITY)
Admission: EM | Admit: 2017-09-22 | Discharge: 2017-09-23 | Disposition: A | Discharge status: Home / Self Care | Payer: Self-pay | Attending: Emergency Medicine | Admitting: Emergency Medicine

## 2017-09-22 DIAGNOSIS — E1140 Type 2 diabetes mellitus with diabetic neuropathy, unspecified: Secondary | ICD-10-CM | POA: Insufficient documentation

## 2017-09-22 DIAGNOSIS — F22 Delusional disorders: Secondary | ICD-10-CM | POA: Insufficient documentation

## 2017-09-22 DIAGNOSIS — I110 Hypertensive heart disease with heart failure: Secondary | ICD-10-CM | POA: Insufficient documentation

## 2017-09-22 DIAGNOSIS — F17210 Nicotine dependence, cigarettes, uncomplicated: Secondary | ICD-10-CM | POA: Insufficient documentation

## 2017-09-22 DIAGNOSIS — Z79899 Other long term (current) drug therapy: Secondary | ICD-10-CM | POA: Insufficient documentation

## 2017-09-22 DIAGNOSIS — I509 Heart failure, unspecified: Secondary | ICD-10-CM | POA: Insufficient documentation

## 2017-09-22 DIAGNOSIS — F1414 Cocaine abuse with cocaine-induced mood disorder: Secondary | ICD-10-CM | POA: Diagnosis present

## 2017-09-22 LAB — CBC
HEMATOCRIT: 36.8 % — AB (ref 39.0–52.0)
HEMOGLOBIN: 12 g/dL — AB (ref 13.0–17.0)
MCH: 29.6 pg (ref 26.0–34.0)
MCHC: 32.6 g/dL (ref 30.0–36.0)
MCV: 90.9 fL (ref 78.0–100.0)
Platelets: 242 10*3/uL (ref 150–400)
RBC: 4.05 MIL/uL — AB (ref 4.22–5.81)
RDW: 13.8 % (ref 11.5–15.5)
WBC: 6.8 10*3/uL (ref 4.0–10.5)

## 2017-09-22 LAB — SALICYLATE LEVEL: Salicylate Lvl: <7 mg/dL (ref 2.8–30.0)

## 2017-09-22 LAB — COMPREHENSIVE METABOLIC PANEL
ALBUMIN: 3.6 g/dL (ref 3.5–5.0)
ALK PHOS: 93 U/L (ref 38–126)
ALT: 15 U/L — AB (ref 17–63)
AST: 25 U/L (ref 15–41)
Anion gap: 11 (ref 5–15)
BUN: 20 mg/dL (ref 6–20)
CALCIUM: 9.4 mg/dL (ref 8.9–10.3)
CHLORIDE: 104 mmol/L (ref 101–111)
CO2: 28 mmol/L (ref 22–32)
CREATININE: 0.85 mg/dL (ref 0.61–1.24)
GFR calc non Af Amer: >60 mL/min (ref >60)
GLUCOSE: 218 mg/dL — AB (ref 65–99)
Potassium: 3.4 mmol/L — ABNORMAL LOW (ref 3.5–5.1)
Sodium: 143 mmol/L (ref 135–145)
Total Bilirubin: 0.5 mg/dL (ref 0.3–1.2)
Total Protein: 7.3 g/dL (ref 6.5–8.1)

## 2017-09-22 LAB — RAPID URINE DRUG SCREEN, HOSP PERFORMED
AMPHETAMINES: NOT DETECTED
BARBITURATES: NOT DETECTED
Benzodiazepines: NOT DETECTED
COCAINE: POSITIVE — AB
OPIATES: NOT DETECTED
TETRAHYDROCANNABINOL: POSITIVE — AB

## 2017-09-22 LAB — ACETAMINOPHEN LEVEL

## 2017-09-22 LAB — ETHANOL: Alcohol, Ethyl (B): <10 mg/dL (ref <10)

## 2017-09-22 LAB — CARBAMAZEPINE LEVEL, TOTAL

## 2017-09-22 MED ORDER — BENZTROPINE MESYLATE 1 MG PO TABS
1.0000 mg | ORAL_TABLET | Freq: Every day | ORAL | Status: DC
  Administered 2017-09-22: 1 mg via ORAL
  Filled 2017-09-22: qty 1

## 2017-09-22 MED ORDER — HYDROXYZINE HCL 25 MG PO TABS
50.0000 mg | ORAL_TABLET | Freq: Two times a day (BID) | ORAL | Status: DC
  Administered 2017-09-22 (×2): 50 mg via ORAL
  Filled 2017-09-22 (×2): qty 2

## 2017-09-22 MED ORDER — ALUM & MAG HYDROXIDE-SIMETH 200-200-20 MG/5ML PO SUSP: 30.0000 mL | Freq: Four times a day (QID) | ORAL | Status: DC | PRN

## 2017-09-22 MED ORDER — HYDROCHLOROTHIAZIDE 25 MG PO TABS
25.0000 mg | ORAL_TABLET | Freq: Every day | ORAL | Status: DC
  Administered 2017-09-22: 25 mg via ORAL
  Filled 2017-09-22 (×2): qty 1

## 2017-09-22 MED ORDER — CARBAMAZEPINE 100 MG PO CHEW
100.0000 mg | CHEWABLE_TABLET | Freq: Every day | ORAL | Status: DC
  Administered 2017-09-22: 100 mg via ORAL
  Filled 2017-09-22 (×2): qty 1

## 2017-09-22 MED ORDER — AMMONIA AROMATIC IN INHA
RESPIRATORY_TRACT | Status: AC
  Administered 2017-09-22: 2 mL
  Filled 2017-09-22: qty 20

## 2017-09-22 MED ORDER — LORAZEPAM 1 MG PO TABS: 1.0000 mg | ORAL_TABLET | ORAL | Status: DC | PRN

## 2017-09-22 MED ORDER — NICOTINE 21 MG/24HR TD PT24
21.0000 mg | MEDICATED_PATCH | Freq: Every day | TRANSDERMAL | Status: DC
  Administered 2017-09-22: 21 mg via TRANSDERMAL
  Filled 2017-09-22: qty 1

## 2017-09-22 MED ORDER — OLANZAPINE 10 MG PO TBDP: 10.0000 mg | ORAL_TABLET | ORAL | Status: DC | PRN

## 2017-09-22 MED ORDER — OLANZAPINE 10 MG PO TBDP
10.0000 mg | ORAL_TABLET | Freq: Once | ORAL | Status: AC
  Administered 2017-09-22: 10 mg via ORAL
  Filled 2017-09-22: qty 1

## 2017-09-22 MED ORDER — LISINOPRIL 20 MG PO TABS
40.0000 mg | ORAL_TABLET | Freq: Every day | ORAL | Status: DC
  Administered 2017-09-22: 40 mg via ORAL
  Filled 2017-09-22 (×2): qty 2

## 2017-09-22 MED ORDER — ARIPIPRAZOLE 5 MG PO TABS
5.0000 mg | ORAL_TABLET | Freq: Every day | ORAL | Status: DC
  Administered 2017-09-22: 5 mg via ORAL
  Filled 2017-09-22: qty 1

## 2017-09-22 MED ORDER — POTASSIUM CHLORIDE CRYS ER 20 MEQ PO TBCR: 40.0000 meq | EXTENDED_RELEASE_TABLET | Freq: Once | ORAL | Status: DC

## 2017-09-22 MED ORDER — ONDANSETRON HCL 4 MG PO TABS: 4.0000 mg | ORAL_TABLET | Freq: Three times a day (TID) | ORAL | Status: DC | PRN

## 2017-09-22 NOTE — ED Notes (Signed)
ED Provider at bedside. EDP [place] AT BEDSIDE. PT AROUSES  BY VOICE. AWARE NEEDS TO EAT LUNCH

## 2017-09-22 NOTE — BH Assessment (Addendum)
Assessment Note  [name] is an 57 y.o. male that presents this date asking for assistance with his SA use. Patient denies any S/I, H/I or AVH. Patient is oriented to time/place. Patient speaks in a slow/soft voice throughout assessment with slurred peech. Patient continues to fall asleep throughout the assessment and his name had to be called several times in order to proceed with the assessment. Pt stated "I smoke crack and need to go to a state hospital." Patient is vague in reference to use and time frames. Patient appears to be impaired at the time of assessment. Patient tested positive for cocaine and THC this date. Per history review patient has had multiple admissions to area providers with patient presenting 3 days ago at [facility] with S/I and SA issues. Patient declines to follow up with any OP assistance due to not having transportation and states, "None of that helps." Patient is requesting long term treatment and states he will be "suicidal if I don't get help." Patient will not elaborate on plan or intent. Per notes, patient has a history of schizophrenia and ongoing cocaine abuse presenting to the ER requesting a "FL2". Patient states he is homeless and has no where to go. Patient reports if unable to be placed in a group home then he will state he is suicidal so he can be placed at "[facility] Patient is requesting to stay until "sunrise". Recently seen in ER for same and evaluated by [facility] 3 days ago and felt stable for discharge at that time. Resources given at that time but pt did not take them with him. No suicidal plan. No other complaints. Case was staffed with Sharma Covert MD who also evaluated patient and recommended patient be discharged later this date.    Diagnosis: F20.9 Schizophrenia (per notes) Polysubstance abuse   Past Medical History:  Past Medical History:  Diagnosis Date  . CHF (congestive heart failure) (HCC)   . Chronic foot pain   . Cocaine abuse (HCC)    . Diabetes mellitus without complication (HCC)   . Hepatitis C   . Homelessness   . Homelessness   . Hypertension   . Neuropathy   . Polysubstance abuse (HCC)   . Schizophrenia (HCC)     History reviewed. No pertinent surgical history.  Family History:  Family History  Problem Relation Age of Onset  . Hypertension Other   . Diabetes Other     Social History:  reports that he has been smoking cigarettes.  He has a 20.00 pack-year smoking history. He uses smokeless tobacco. He reports that he drinks alcohol. He reports that he has current or past drug history. Drugs: "Crack" cocaine, Cocaine, and Marijuana. Frequency: 7.00 times per week.  Additional Social History:  Alcohol / Drug Use Pain Medications: please see mar Prescriptions: please see mar Over the Counter: please see mar History of alcohol / drug use?: Yes Longest period of sobriety (when/how long): NA Negative Consequences of Use: Financial, Legal, Personal relationships, Work / School Withdrawal Symptoms: (Pt denies) Substance #1 Name of Substance 1: cocaine 1 - Age of First Use: unknown 1 - Amount (size/oz): unknown 1 - Frequency: unknown 1 - Duration: ongoing 1 - Last Use / Amount: unknown Substance #2 Name of Substance 2: marijuana 2 - Age of First Use: Unknown 2 - Amount (size/oz): Unknown 2 - Frequency: Unknown 2 - Duration: Unknown 2 - Last Use / Amount: Unknown  CIWA: CIWA-Ar BP: 139/73 Pulse Rate: 81 COWS:  Allergies:  Allergies  Allergen Reactions  . Haldol [Haloperidol] Other (See Comments)    Muscle spasms, loss of voluntary movement. However, pt has taken Thorazine on multiple occasions with no adverse effects.     Home Medications:  (Not in a hospital admission)  OB/GYN Status:  No LMP for male patient.  General Assessment Data [facility] of Assessment: [facility] TTS Assessment: In system Is this a Tele or Face-to-Face Assessment?: Face-to-Face Is this an Initial Assessment or a  Re-assessment for this encounter?: Initial Assessment Marital status: Single Maiden name: NA Is patient pregnant?: No Pregnancy Status: No Living Arrangements: Other (Comment)(Homeless) Can pt return to current living arrangement?: Yes Admission Status: Voluntary Is patient capable of signing voluntary admission?: Yes Referral Source: Self/Family/Friend Insurance type: Self Pay  Medical Screening Exam [facility] Walk-in ONLY) Medical Exam completed: Yes  Crisis Care Plan Living Arrangements: Other (Comment)(Homeless) Legal Guardian: (NA) Name of Psychiatrist: None Name of Therapist: None  Education Status Is patient currently in school?: No Is the patient employed, unemployed or receiving disability?: Unemployed  Risk to self with the past 6 months Suicidal Ideation: No Has patient been a risk to self within the past 6 months prior to admission? : No Suicidal Intent: No Has patient had any suicidal intent within the past 6 months prior to admission? : No Is patient at risk for suicide?: No Suicidal Plan?: No Has patient had any suicidal plan within the past 6 months prior to admission? : No Access to Means: No What has been your use of drugs/alcohol within the last 12 months?: Current use Previous Attempts/Gestures: No How many times?: 0 Other Self Harm Risks: NA Triggers for Past Attempts: Unknown Intentional Self Injurious Behavior: None Family Suicide History: No Recent stressful life event(s): Other (Comment)(Homeless) Persecutory voices/beliefs?: No Depression: Yes Depression Symptoms: Feeling worthless/self pity Substance abuse history and/or treatment for substance abuse?: Yes Suicide prevention information given to non-admitted patients: Not applicable  Risk to Others within the past 6 months Homicidal Ideation: No Does patient have any lifetime risk of violence toward others beyond the six months prior to admission? : No Thoughts of Harm to Others: No Current  Homicidal Intent: No Current Homicidal Plan: No Access to Homicidal Means: No Identified Victim: NA History of harm to others?: No Assessment of Violence: None Noted Violent Behavior Description: NA Does patient have access to weapons?: No Criminal Charges Pending?: No Does patient have a court date: No Is patient on probation?: No  Psychosis Hallucinations: None noted Delusions: None noted  Mental Status Report Appearance/Hygiene: In scrubs Eye Contact: Poor Motor Activity: Freedom of movement Speech: Soft, Slow Level of Consciousness: Drowsy Mood: Depressed Affect: Appropriate to circumstance Anxiety Level: None Thought Processes: Coherent, Relevant Judgement: Unimpaired Orientation: Person, Place, Time Obsessive Compulsive Thoughts/Behaviors: None  Cognitive Functioning Concentration: Good Memory: Recent Intact, Remote Intact Is patient IDD: No Is patient DD?: No Insight: Fair Impulse Control: Fair Appetite: Good Have you had any weight changes? : No Change Sleep: No Change Total Hours of Sleep: 7 Vegetative Symptoms: None  ADLScreening [facility] Assessment Services) Patient's cognitive ability adequate to safely complete daily activities?: Yes Patient able to express need for assistance with ADLs?: Yes Independently performs ADLs?: Yes (appropriate for developmental age)  Prior Inpatient Therapy Prior Inpatient Therapy: Yes Prior Therapy Dates: (Multiple) [facility] Reason for Treatment: MH issues  Prior Outpatient Therapy Prior Outpatient Therapy: No Does patient have an ACCT team?: No Does patient have Intensive In-House Services?  :  No Does patient have [name] services? : No Does patient have P4CC services?: No  ADL Screening (condition at time of admission) Patient's cognitive ability adequate to safely complete daily activities?: Yes Is the patient deaf or have difficulty hearing?: No Does the patient have difficulty  seeing, even when wearing glasses/contacts?: No Does the patient have difficulty concentrating, remembering, or making decisions?: No Patient able to express need for assistance with ADLs?: Yes Does the patient have difficulty dressing or bathing?: No Independently performs ADLs?: Yes (appropriate for developmental age) Does the patient have difficulty walking or climbing stairs?: No Weakness of Legs: None Weakness of Arms/Hands: None  Home Assistive Devices/Equipment Home Assistive Devices/Equipment: None  Therapy Consults (therapy consults require a physician order) PT Evaluation Needed: No OT Evalulation Needed: No SLP Evaluation Needed: No Abuse/Neglect Assessment (Assessment to be complete while patient is alone) Physical Abuse: Denies Verbal Abuse: Denies Sexual Abuse: Denies Exploitation of patient/patient's resources: Denies Self-Neglect: Denies Values / Beliefs Cultural Requests During Hospitalization: None Spiritual Requests During Hospitalization: None Consults Spiritual Care Consult Needed: No Social Work Consult Needed: No Merchant navy officer (For Healthcare) Does Patient Have a Medical Advance Directive?: No Would patient like information on creating a medical advance directive?: No - Patient declined Nutrition Screen- [facility] Patient's home diet: Regular  Additional Information 1:1 In Past 12 Months?: No CIRT Risk: No Elopement Risk: No Does patient have medical clearance?: No     Disposition: Case was staffed with Sharma Covert MD who also evaluated patient and recommended patient be discharged later this date.  Disposition Initial Assessment Completed for this Encounter: Yes Disposition of Patient: Discharge Patient refused recommended treatment: No Mode of transportation if patient is discharged?: (Unknown) Patient referred to: Outpatient clinic referral  On Site Evaluation by:   Reviewed with Physician:    Alfredia Ferguson 09/22/2017 12:38 PM

## 2017-09-22 NOTE — ED Notes (Signed)
HOUSE ACRN [place] MADE AWARE OF EVENT AND CONTINUED NEED FOR SITTER

## 2017-09-22 NOTE — ED Notes (Signed)
Patient seen sleeping. (snoring) Difficult to arouse

## 2017-09-22 NOTE — ED Provider Notes (Signed)
[facility]-EMERGENCY DEPT Provider Note   CSN: [id number] Arrival date & time: 09/22/17  [id number]   LEVEL 5 CAVEAT - PSYCHOSIS  History   Chief Complaint Chief Complaint  Patient presents with  . Paranoid    HPI [name] is a 57 y.o. male.  HPI  57 year old male with a history of CHF, diabetes, cocaine abuse and schizophrenia with hallucinations. States there are snakes all over his body.  States he used cocaine last night.  Cannot tell me if the snakes started before or after using cocaine. Was brought in by EMS. Denies pain. History is quite limited, patient does not answer all questions and occasionally answers questions inappropriately.  Past Medical History:  Diagnosis Date  . CHF (congestive heart failure) (HCC)   . Chronic foot pain   . Cocaine abuse (HCC)   . Diabetes mellitus without complication (HCC)   . Hepatitis C   . Homelessness   . Homelessness   . Hypertension   . Neuropathy   . Polysubstance abuse (HCC)   . Schizophrenia [facility](HCC)     Patient Active Problem List   Diagnosis Date Noted  . Cocaine abuse with cocaine-induced mood disorder (HCC) 09/18/2017  . Chronic foot pain   . Schizoaffective disorder, bipolar type (HCC) 09/30/2016  . Suicidal thoughts   . Substance induced mood disorder (HCC) 03/13/2015  . Acute kidney failure (HCC) 01/26/2015  . Schizophrenia, paranoid type (HCC) 01/17/2015  . Suicidal ideation   . Drug hallucinosis (HCC) 10/08/2014  . Chronic paranoid schizophrenia (HCC) 09/07/2014  . 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 Acute CHF (congestive heart failure) (HCC) 07/29/2014  . Cocaine use disorder, severe, dependence (HCC)   . Cocaine abuse (HCC) 03/10/2014  . Essential hypertension, benign 03/28/2013  . Diabetes mellitus (HCC) 03/15/2013    History reviewed. No pertinent surgical history.      Home Medications     Prior to Admission medications   Medication Sig Start Date End Date Taking? Authorizing Provider  ARIPiprazole (ABILIFY) 5 MG tablet Take 5 mg by mouth daily.   Yes [provider]  benztropine (COGENTIN) 1 MG tablet Take 1 mg by mouth daily.   Yes [provider]  carbamazepine (TEGRETOL) 100 MG chewable tablet Chew 100 mg by mouth daily.   Yes [provider]  hydrochlorothiazide (HYDRODIURIL) 25 MG tablet Take 25 mg by mouth daily.   Yes [provider]  hydrOXYzine (ATARAX/VISTARIL) 50 MG tablet Take 50 mg by mouth 2 (two) times daily.   Yes [provider]  lisinopril (PRINIVIL,ZESTRIL) 40 MG tablet Take 40 mg by mouth daily.   Yes [provider]    Family History Family History  Problem Relation Age of Onset  . Hypertension Other   . Diabetes Other     Social History Social History   Tobacco Use  . Smoking status: Current Every Day Smoker    Packs/day: 1.00    Years: 20.00    Pack years: 20.00    Types: Cigarettes  . Smokeless tobacco: Current User  Substance Use Topics  . Alcohol use: Yes    Comment: Daily Drinker   . Drug use: Yes    Frequency: 7.0 times per week    Types: "Crack" cocaine, Cocaine, Marijuana    Comment: Cocaine tonight, Marijuana "a long time"     Allergies   Haldol [haloperidol]   Review of Systems Review of Systems  Unable  to perform ROS: Psychiatric disorder     Physical Exam Updated Vital Signs BP 139/73 (BP Location: Left Arm)   Pulse 81   Temp (!) 97.4 F (36.3 C) (Axillary)   Resp 16   Ht 5' 8.5" (1.74 m)   Wt 81.6 kg (180 lb)   SpO2 94%   BMI 26.97 kg/m   Physical Exam  Constitutional: He appears well-developed and well-nourished.  HENT:  Head: Normocephalic and atraumatic.  Right Ear: External ear normal.  Left Ear: External ear normal.  Nose: Nose normal.  Eyes: Right eye exhibits no discharge. Left eye exhibits no discharge.  Neck: Neck supple.   Cardiovascular: Normal rate, regular rhythm and normal heart sounds.  Pulmonary/Chest: Effort normal and breath sounds normal.  Abdominal: Soft. He exhibits no distension. There is no tenderness.  Musculoskeletal: He exhibits no edema.  Neurological: He is alert.  Skin: Skin is warm and dry. He is not diaphoretic.  Psychiatric: He is actively hallucinating.  Repeatedly rubbing multiple parts of his body yelling about snakes  Nursing note and vitals reviewed.    ED Treatments / Results  Labs (all labs ordered are listed, but only abnormal results are displayed) Labs Reviewed  COMPREHENSIVE METABOLIC PANEL - Abnormal; Notable for the following components:      Result Value   Potassium 3.4 (*)    Glucose, Bld 218 (*)    ALT 15 (*)    All other components within normal limits  ACETAMINOPHEN LEVEL - Abnormal; Notable for the following components:   Acetaminophen (Tylenol), Serum <10 (*)    All other components within normal limits  CBC - Abnormal; Notable for the following components:   RBC 4.05 (*)    Hemoglobin 12.0 (*)    HCT 36.8 (*)    All other components within normal limits  RAPID URINE DRUG SCREEN, HOSP PERFORMED - Abnormal; Notable for the following components:   Cocaine POSITIVE (*)    Tetrahydrocannabinol POSITIVE (*)    All other components within normal limits  CARBAMAZEPINE LEVEL, TOTAL - Abnormal; Notable for the following components:   Carbamazepine Lvl <2.0 (*)    All other components within normal limits  ETHANOL  SALICYLATE LEVEL    EKG None  Radiology No results found.  Procedures Procedures (including critical care time)  Medications Ordered in ED Medications  nicotine (NICODERM CQ - dosed in mg/24 hours) patch 21 mg (21 mg Transdermal Patch Applied 09/22/17 0958)  alum & mag hydroxide-simeth (MAALOX/MYLANTA) 200-200-20 MG/5ML suspension 30 mL (has no administration in time range)  ondansetron (ZOFRAN) tablet 4 mg (has no administration in time  range)  OLANZapine zydis (ZYPREXA) disintegrating tablet 10 mg (10 mg Oral Given 09/22/17 0953)    And  LORazepam (ATIVAN) tablet 1 mg (has no administration in time range)  ARIPiprazole (ABILIFY) tablet 5 mg (5 mg Oral Given 09/22/17 1148)  benztropine (COGENTIN) tablet 1 mg (1 mg Oral Given 09/22/17 1148)  carbamazepine (TEGRETOL) chewable tablet 100 mg (100 mg Oral Given 09/22/17 1146)  hydrochlorothiazide (HYDRODIURIL) tablet 25 mg (25 mg Oral Given 09/22/17 1146)  hydrOXYzine (ATARAX/VISTARIL) tablet 50 mg (50 mg Oral Given 09/22/17 1200)  lisinopril (PRINIVIL,ZESTRIL) tablet 40 mg (40 mg Oral Given 09/22/17 1145)  ammonia inhalant (2 mLs  Given 09/22/17 1224)     Initial Impression / Assessment and Plan / ED Course  I have reviewed the triage vital signs and the nursing notes.  Pertinent labs & imaging results that were available during my care of  the patient were reviewed by me and considered in my medical decision making (see chart for details).     I believe the patient's acute psychosis is from cocaine abuse and his schizophrenia.  He was given Zyprexa and is much calmer and resting.  He will need reevaluation as he is currently still too sleepy for discharge.  However I highly doubt an acute metabolic/pathologic cause of his hallucinations.  Care to Dr. Erma Heritage, with reevaluation and likely discharge pending.  Final Clinical Impressions(s) / ED Diagnoses   Final diagnoses:  Cocaine abuse with cocaine-induced mood disorder [facility])    ED Discharge Orders        Ordered    Increase activity slowly     09/22/17 1249    Diet - low sodium heart healthy     09/22/17 1249    Discharge instructions    Comments:  Discharge home   09/22/17 1249       Pricilla Loveless, MD 09/22/17 [phone]

## 2017-09-22 NOTE — ED Notes (Signed)
PT UPON ARRIVAL TO ROOM, TALKING TO PERSON IN ROOM THAT IS NOT THERE, INCONSISTENT WITH MOOD AND BEHAVIOR AT TIMES. CALM AND COOPERATIVE THEN THREATENING TO HARM STAFF. SECURITY PRESENT TO WITNESS BEHAVIOR. EDP GOLDSTON AND EDP KNAPP CALLED TO OBSERVE HOWEVER UPON THEIR ARRIVAL PT IS CALM IN CHAIR. ORDERS FOR PO MEDICATION GIVEN.

## 2017-09-22 NOTE — [facility] Suicide Risk Assessment (Signed)
Suicide Risk Assessment  Discharge Assessment   [facility]   Principal Problem: Cocaine abuse with cocaine-induced mood disorder [facility](HCC) Discharge Diagnoses:  Patient Active Problem List   Diagnosis Date Noted  . Cocaine abuse with cocaine-induced mood disorder [facility](HCC) [F14.14] 09/18/2017    Priority: High  . Schizophrenia, paranoid type (HCC) [F20.0] 01/17/2015    Priority: High  . Drug hallucinosis [facility](HCC) [F19.951] 10/08/2014    Priority: High  . Chronic paranoid schizophrenia (HCC) [F20.0] 09/07/2014    Priority: High  . Cocaine use disorder, severe, dependence (HCC) [F14.20]     Priority: High  . Cocaine abuse (HCC) [F14.10] 03/10/2014    Priority: High  . Chronic foot pain [M79.673, G89.29]   . Schizoaffective disorder, bipolar type (HCC) [F25.0] 09/30/2016  . Suicidal thoughts [R45.851]   . Substance induced mood disorder (HCC) [F19.94] 03/13/2015  . Acute kidney failure (HCC) [N17.9] 01/26/2015  . Suicidal ideation [R45.851]   . Substance or medication-induced bipolar and related disorder with onset during intoxication (HCC) [Z61.096[F19.929, F19.94] 08/10/2014  . Acute CHF (congestive heart failure) (HCC) [I50.9] 07/29/2014  . Essential hypertension, benign [I10] 03/28/2013  . Diabetes mellitus (HCC) [E11.9] 03/15/2013    Total Time spent with patient: 45 minutes  Musculoskeletal: Strength & Muscle Tone: within normal limits Gait & Station: normal Patient leans: N/A  Psychiatric Specialty Exam:   Blood pressure 139/73, pulse 81, temperature (!) 97.4 F (36.3 C), temperature source Axillary, resp. rate 16, height 5' 8.5" (1.74 m), weight 81.6 kg (180 lb), SpO2 94 %.Body mass index is 26.97 kg/m.  General Appearance: Casual  Eye Contact::  Good  Speech:  Normal Rate409  Volume:  Normal  Mood:  Irritable  Affect:  Congruent  Thought Process:  Coherent and Descriptions of Associations: Intact  Orientation:  Full (Time, Place, and Person)  Thought  Content:  WDL and Logical  Suicidal Thoughts:  No  Homicidal Thoughts:  No  Memory:  Immediate;   Good Recent;   Good Remote;   Good  Judgement:  Fair  Insight:  Fair  Psychomotor Activity:  Decreased  Concentration:  Good  Recall:  Good  Fund of Knowledge:Fair  Language: Good  Akathisia:  No  Handed:  Right  AIMS (if indicated):     Assets:  Leisure Time Physical Health Resilience  Sleep:     Cognition: WNL  ADL's:  Intact   Mental Status Per Nursing Assessment::   On Admission:   cocaine abuse, no suicidal/homicidal ideations, hallucinations, or withdrawal symptoms.  Peer support consult placed.  Stable for discharge.  Demographic Factors:  Male  Loss Factors: NA  Historical Factors: NA  Risk Reduction Factors:   Sense of responsibility to family and Positive social support  Continued Clinical Symptoms:  Irritable at times  Cognitive Features That Contribute To Risk:  None    Suicide Risk:  Minimal: No identifiable suicidal ideation.  Patients presenting with no risk factors but with morbid ruminations; may be classified as minimal risk based on the severity of the depressive symptoms    Plan Of Care/Follow-up recommendations:  Activity:  as tolerated Diet:  heart healthy diet  [name], NP 09/22/2017, 12:47 PM

## 2017-09-22 NOTE — ED Notes (Signed)
Patient transported to CT scan . 

## 2017-09-22 NOTE — ED Notes (Signed)
Bed: [facility] Expected date:  Expected time:  Means of arrival:  Comments: 

## 2017-09-22 NOTE — ED Notes (Signed)
PT DIFFICULT TO AROUSE BY VOICE AND PAINFUL STIMULI. PT RESPONDED TO AMMONIA. WHEN AROUSED PT ABLE TO STATE HIS NAME. PT ABLE TO FOLLOW SIMPLE COMMANDS. PT VERY DROWSY. VITALS 93% RA RR 16 HR 82. PT REMAINS IN CARDIAC CHAIR. PT ASKED FOR SOMETHING TO DRINK. HELD AT THIS TIME. EDP GOLDSTON MADE AWARE OF EVENT. NO ORDERS GIVEN AT THIS TIME. SITTER REMAINS AT BEDSIDE.

## 2017-09-22 NOTE — BH Assessment (Signed)
[facility] Assessment Progress Note  Case was staffed with Sharma CovertNorman MD who also evaluated patient and recommended patient be discharged later this date.

## 2017-09-22 NOTE — ED Notes (Signed)
Pt. Still not able to stay awake and focus. Pt. Has been asleep all shift(7p-11p).

## 2017-09-22 NOTE — ED Notes (Signed)
TTS DAVID MADE AWARE OF EVENT. WILL BE DELAY IN DISCHARGE

## 2017-09-22 NOTE — ED Notes (Signed)
UP IN CARDIAC CHAIR. PT RESTING WITHOUT COMPLAINT

## 2017-09-22 NOTE — Discharge Instructions (Addendum)
For your mental health needs, you are advised to follow up with [name].  New and returning patients are seen at their walk-in clinic.  Walk-in hours are Monday - Friday from 8:00 am - 3:00 pm.  Walk-in patients are seen on a first come, first served basis.  Try to arrive as early as possible for he best chance of being seen the same day: ° °     [name] °     [address] °     [place], [place] [address] °     [phone] °

## 2017-09-22 NOTE — ED Notes (Signed)
TTS ATTEMPTING TO SPEAK WITH PT

## 2017-09-22 NOTE — ED Notes (Signed)
Upon my arrival @ 7pm my patient is completely unable to stay awake nor focus. Patient unable to stand without stumbling. Staff continues to monitor patient and to make sure patient is safe.

## 2017-09-22 NOTE — ED Notes (Addendum)
Patient back from CT scan. Sleeping

## 2017-09-22 NOTE — ED Notes (Signed)
Bed: [facility] Expected date:  Expected time:  Means of arrival:  Comments: 

## 2017-09-22 NOTE — BH Assessment (Signed)
[facility] Assessment Progress Note  Per Jacqueline Norman, DO, this pt does not require psychiatric hospitalization at this time.  [facility] is [facility] be [facility] from [facility] with recommendation to follow up with [name].  This has been included in pt's discharge instructions.  Pt's nurse has been notified.  Taylor Plotts, MA Triage Specialist [phone]     

## 2017-09-22 NOTE — ED Provider Notes (Addendum)
Patient now awake, alert, in NAD. Easily awakens. Ambulatory, has eaten a meal. Psychiatrically and medically cleared.  ADDENDUM: Pt now drowsy. Of note, he was given atarax. I suspect this is all 2/2 his medications, but will continue to monitor. CT ordered as well just to rule out abnormality, in addition to VBG. Nursing aware to HOLD any sedating medications. Sign out to Dr. Rhunette CroftNanavati, MD, to follow-up labs, d/c once awake.   [name], Taylor Abdelrahman, MD 09/22/17 [id number]    [name], Taylor Nase, MD 09/22/17 [phone]

## 2017-09-22 NOTE — ED Notes (Signed)
Patient seen in his room eating but drowsy. Observed sleeping while eating. Patient unable to keep awake. Will continue to monitor patient.

## 2017-09-23 LAB — BLOOD GAS, VENOUS
Acid-Base Excess: 5.3 mmol/L — ABNORMAL HIGH (ref 0.0–2.0)
BICARBONATE: 29.6 mmol/L — AB (ref 20.0–28.0)
O2 Saturation: 94.9 %
PATIENT TEMPERATURE: 98.6
PH VEN: 7.443 — AB (ref 7.250–7.430)
pCO2, Ven: 44 mmHg (ref 44.0–60.0)
pO2, Ven: 76.4 mmHg — ABNORMAL HIGH (ref 32.0–45.0)

## 2017-09-23 LAB — URINALYSIS, ROUTINE W REFLEX MICROSCOPIC
Bacteria, UA: NONE SEEN
Bilirubin Urine: NEGATIVE
GLUCOSE, UA: NEGATIVE mg/dL
KETONES UR: NEGATIVE mg/dL
Leukocytes, UA: NEGATIVE
NITRITE: NEGATIVE
PH: 5 (ref 5.0–8.0)
Protein, ur: NEGATIVE mg/dL
Specific Gravity, Urine: 1.025 (ref 1.005–1.030)

## 2017-09-23 NOTE — ED Notes (Signed)
Dr. Clayborne DanaMesner notified

## 2017-09-23 NOTE — ED Notes (Signed)
Cleared from psych team

## 2017-09-23 NOTE — ED Notes (Signed)
Patient up at bedside responding. Used bathroom in urinal. Requested crackers and orange juice.

## 2017-09-24 ENCOUNTER — Emergency Department (HOSPITAL_COMMUNITY)
Admission: EM | Admit: 2017-09-24 | Discharge: 2017-09-24 | Disposition: A | Discharge status: Home / Self Care | Payer: Self-pay | Attending: Emergency Medicine | Admitting: Emergency Medicine

## 2017-09-24 ENCOUNTER — Emergency Department (HOSPITAL_COMMUNITY): Payer: Self-pay

## 2017-09-24 ENCOUNTER — Encounter (HOSPITAL_COMMUNITY): Payer: Self-pay

## 2017-09-24 DIAGNOSIS — I110 Hypertensive heart disease with heart failure: Secondary | ICD-10-CM | POA: Insufficient documentation

## 2017-09-24 DIAGNOSIS — I509 Heart failure, unspecified: Secondary | ICD-10-CM | POA: Insufficient documentation

## 2017-09-24 DIAGNOSIS — Z5900 Homelessness unspecified: Secondary | ICD-10-CM

## 2017-09-24 DIAGNOSIS — Z79899 Other long term (current) drug therapy: Secondary | ICD-10-CM | POA: Insufficient documentation

## 2017-09-24 DIAGNOSIS — R197 Diarrhea, unspecified: Secondary | ICD-10-CM | POA: Insufficient documentation

## 2017-09-24 DIAGNOSIS — E1140 Type 2 diabetes mellitus with diabetic neuropathy, unspecified: Secondary | ICD-10-CM | POA: Insufficient documentation

## 2017-09-24 DIAGNOSIS — R45851 Suicidal ideations: Secondary | ICD-10-CM | POA: Insufficient documentation

## 2017-09-24 DIAGNOSIS — F17210 Nicotine dependence, cigarettes, uncomplicated: Secondary | ICD-10-CM | POA: Insufficient documentation

## 2017-09-24 DIAGNOSIS — F329 Major depressive disorder, single episode, unspecified: Secondary | ICD-10-CM | POA: Insufficient documentation

## 2017-09-24 DIAGNOSIS — N410 Acute prostatitis: Secondary | ICD-10-CM

## 2017-09-24 LAB — URINALYSIS, ROUTINE W REFLEX MICROSCOPIC
Bilirubin Urine: NEGATIVE
GLUCOSE, UA: NEGATIVE mg/dL
KETONES UR: 5 mg/dL — AB
Leukocytes, UA: NEGATIVE
Nitrite: NEGATIVE
PROTEIN: 30 mg/dL — AB
SQUAMOUS EPITHELIAL / LPF: NONE SEEN
Specific Gravity, Urine: 1.024 (ref 1.005–1.030)
pH: 6 (ref 5.0–8.0)

## 2017-09-24 LAB — RAPID URINE DRUG SCREEN, HOSP PERFORMED
AMPHETAMINES: NOT DETECTED
BENZODIAZEPINES: NOT DETECTED
Barbiturates: NOT DETECTED
COCAINE: POSITIVE — AB
OPIATES: NOT DETECTED
Tetrahydrocannabinol: NOT DETECTED

## 2017-09-24 MED ORDER — ACETAMINOPHEN 325 MG PO TABS
650.0000 mg | ORAL_TABLET | Freq: Once | ORAL | Status: AC
  Administered 2017-09-24: 650 mg via ORAL
  Filled 2017-09-24: qty 2

## 2017-09-24 MED ORDER — SULFAMETHOXAZOLE-TRIMETHOPRIM 800-160 MG PO TABS: 1.0000 | ORAL_TABLET | Freq: Two times a day (BID) | ORAL | 0 refills | Status: DC

## 2017-09-24 MED ORDER — SULFAMETHOXAZOLE-TRIMETHOPRIM 800-160 MG PO TABS
1.0000 | ORAL_TABLET | Freq: Once | ORAL | Status: AC
  Administered 2017-09-24: 1 via ORAL
  Filled 2017-09-24: qty 1

## 2017-09-24 MED ORDER — OXYCODONE-ACETAMINOPHEN 5-325 MG PO TABS
1.0000 | ORAL_TABLET | ORAL | Status: DC | PRN
  Filled 2017-09-24: qty 1

## 2017-09-24 NOTE — ED Notes (Signed)
[facility] Expected date:  Expected time:  Means of arrival:  Comments: 

## 2017-09-24 NOTE — ED Triage Notes (Signed)
Pt complains of abd pain, penis pain and he's urinated on himself Then he stated he wanted to kill himself

## 2017-09-24 NOTE — ED Provider Notes (Addendum)
[facility]-EMERGENCY DEPT Provider Note   CSN: [id number] Arrival date & time: 09/24/17  0353  Time seen 05:45 AM   History   Chief Complaint Chief Complaint  Patient presents with  . Suicidal   Level 5 caveat for mental disorder  HPI Taylor GRYGIEL is a 57 y.o. male.  HPI when I go in the room patient is sleeping sitting up in a chair.  When I wake him up he states he is here because "my prostate and abdomen have been hurting.  He tells me several different things but he finally states it has been going on for 48-72 hours.  He denies nausea, vomiting, or fever.  He states he has had 2 episodes of diarrhea today that are loose.  He states he has difficulty urinating and that he is just dribbling a small amount and having to pee frequently.  He denies any hematuria.  Please note patient keeps falling asleep and is hard to keep awake to talk to him.  Patient also states he is depressed.  When I ask him what is going on he states "same stuff over and over".  He states he is suicidal and when asking what he would do to harm himself he states "I do not know" and then he states "what ever I can".  He states he has hurt himself in the past by banging his head on a wall.  PCP [name], [name], NP   Past Medical History:  Diagnosis Date  . CHF (congestive heart failure) (HCC)   . Chronic foot pain   . Cocaine abuse (HCC)   . Diabetes mellitus without complication (HCC)   . Hepatitis C   . Homelessness   . Homelessness   . Hypertension   . Neuropathy   . Polysubstance abuse (HCC)   . Schizophrenia [facility])     Patient Active Problem List   Diagnosis Date Noted  . Cocaine abuse with cocaine-induced mood disorder (HCC) 09/18/2017  . Chronic foot pain   . Schizoaffective disorder, bipolar type (HCC) 09/30/2016  . Suicidal thoughts   . Substance induced mood disorder (HCC) 03/13/2015  . Acute kidney failure (HCC) 01/26/2015  . Schizophrenia, paranoid type  (HCC) 01/17/2015  . Suicidal ideation   . Drug hallucinosis (HCC) 10/08/2014  . Chronic paranoid schizophrenia (HCC) 09/07/2014  . 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 Acute CHF (congestive heart failure) (HCC) 07/29/2014  . Cocaine use disorder, severe, dependence (HCC)   . Cocaine abuse (HCC) 03/10/2014  . Essential hypertension, benign 03/28/2013  . Diabetes mellitus (HCC) 03/15/2013    History reviewed. No pertinent surgical history.      Home Medications    Prior to Admission medications   Medication Sig Start Date End Date Taking? Authorizing Provider  ARIPiprazole (ABILIFY) 5 MG tablet Take 5 mg by mouth daily.   Yes [provider]  benztropine (COGENTIN) 1 MG tablet Take 1 mg by mouth daily.   Yes [provider]  carbamazepine (TEGRETOL) 100 MG chewable tablet Chew 100 mg by mouth daily.   Yes [provider]  hydrochlorothiazide (HYDRODIURIL) 25 MG tablet Take 25 mg by mouth daily.   Yes [provider]  hydrOXYzine (ATARAX/VISTARIL) 50 MG tablet Take 50 mg by mouth 2 (two) times daily.   Yes [provider]  lisinopril (PRINIVIL,ZESTRIL) 40 MG tablet Take 40 mg by mouth daily.   Yes [provider]  sulfamethoxazole-trimethoprim (  BACTRIM DS,SEPTRA DS) 800-160 MG tablet Take 1 tablet by mouth 2 (two) times daily. 09/24/17   Devoria AlbeKnapp, Jaqlyn Gruenhagen, MD    Family History Family History  Problem Relation Age of Onset  . Hypertension Other   . Diabetes Other     Social History Social History   Tobacco Use  . Smoking status: Current Every Day Smoker    Packs/day: 1.00    Years: 20.00    Pack years: 20.00    Types: Cigarettes  . Smokeless tobacco: Current User  Substance Use Topics  . Alcohol use: Yes    Comment: Daily Drinker   . Drug use: Yes    Frequency: 7.0 times per week    Types: "Crack" cocaine, Cocaine, Marijuana    Comment: Cocaine  tonight, Marijuana "a long time"  Patient is homeless. He states he is on disability for mental problems.   Allergies   Haldol [haloperidol]   Review of Systems Review of Systems  All other systems reviewed and are negative.    Physical Exam Updated Vital Signs BP (!) 182/64 (BP Location: Left Arm)   Pulse (!) 114   Temp 98.9 F (37.2 C) (Oral)   Resp 18   SpO2 98%   Physical Exam  Constitutional: He is oriented to person, place, and time. He appears well-developed and well-nourished.  Non-toxic appearance. He does not appear ill. No distress.  Sleepy, starts mumbling when talking  HENT:  Head: Normocephalic and atraumatic.  Right Ear: External ear normal.  Left Ear: External ear normal.  Nose: Nose normal. No mucosal edema or rhinorrhea.  Mouth/Throat: Mucous membranes are dry. No dental abscesses or uvula swelling.  Eyes: Pupils are equal, round, and reactive to light. Conjunctivae and EOM are normal.  Neck: Normal range of motion and full passive range of motion without pain. Neck supple.  Cardiovascular: Normal rate, regular rhythm and normal heart sounds. Exam reveals no gallop and no friction rub.  No murmur heard. Pulmonary/Chest: Effort normal and breath sounds normal. No respiratory distress. He has no wheezes. He has no rhonchi. He has no rales. He exhibits no tenderness and no crepitus.  Abdominal: Soft. Normal appearance and bowel sounds are normal. He exhibits no distension. There is generalized tenderness. There is no rebound and no guarding.  Musculoskeletal: Normal range of motion. He exhibits no edema or tenderness.  Moves all extremities well.   Neurological: He is alert and oriented to person, place, and time. He has normal strength. No cranial nerve deficit.  Skin: Skin is warm, dry and intact. No rash noted. No erythema. No pallor.  Psychiatric: His speech is delayed. He is slowed. He exhibits a depressed mood. He expresses suicidal ideation.  Nursing  note and vitals reviewed.    ED Treatments / Results  Labs (all labs ordered are listed, but only abnormal results are displayed) Results for orders placed or performed during the hospital encounter of 09/24/17  Urinalysis, Routine w reflex microscopic  Result Value Ref Range   Color, Urine YELLOW YELLOW   APPearance CLEAR CLEAR   Specific Gravity, Urine 1.024 1.005 - 1.030   pH 6.0 5.0 - 8.0   Glucose, UA NEGATIVE NEGATIVE mg/dL   Hgb urine dipstick SMALL (A) NEGATIVE   Bilirubin Urine NEGATIVE NEGATIVE   Ketones, ur 5 (A) NEGATIVE mg/dL   Protein, ur 30 (A) NEGATIVE mg/dL   Nitrite NEGATIVE NEGATIVE   Leukocytes, UA NEGATIVE NEGATIVE   RBC / HPF 0-5 0 - 5 RBC/hpf  WBC, UA 0-5 0 - 5 WBC/hpf   Bacteria, UA RARE (A) NONE SEEN   Squamous Epithelial / LPF NONE SEEN NONE SEEN   Mucus PRESENT   Urine rapid drug screen (hosp performed)  Result Value Ref Range   Opiates NONE DETECTED NONE DETECTED   Cocaine POSITIVE (A) NONE DETECTED   Benzodiazepines NONE DETECTED NONE DETECTED   Amphetamines NONE DETECTED NONE DETECTED   Tetrahydrocannabinol NONE DETECTED NONE DETECTED   Barbiturates NONE DETECTED NONE DETECTED   Laboratory interpretation all normal except + UDS    EKG None   Radiology  Ct Abdomen Pelvis Wo Contrast  Result Date: 09/24/2017 CLINICAL DATA:  Abdominal pain tracking into penis region. Hepatitis-C. EXAM: CT ABDOMEN AND PELVIS WITHOUT CONTRAST TECHNIQUE: Multidetector CT imaging of the abdomen and pelvis was performed following the standard protocol without oral or IV contrast. COMPARISON:  CT abdomen and pelvis December 18, 2008 FINDINGS: Lower chest: There is fibrosis in portions of the lung bases. Hepatobiliary: No focal liver lesions are appreciable on this noncontrast enhanced study. Gallbladder wall is not appreciably thickened. There is no biliary duct dilatation. Pancreas: There is no pancreatic mass or inflammatory focus. Spleen: No splenic lesions are  evident. Adrenals/Urinary Tract: Adrenals appear normal bilaterally. There is a cyst arising from the anterior right kidney measuring 1.8 x 1.5 cm. A cyst arising from the anterior mid kidney on the right measures 4.0 x 3.8 cm. There is no hydronephrosis on either side. There is no renal or ureteral calculus on either side. The urinary bladder is distended. Urinary bladder wall thickness is within normal limits. Stomach/Bowel: There is no appreciable bowel wall or mesenteric thickening. No evident bowel obstruction. No free air or portal venous air. Vascular/Lymphatic: There is a atherosclerotic calcification throughout the aorta and the common iliac arteries. There is also internal and external iliac and common femoral artery calcification bilaterally. There is mild dilatation of the distal abdominal aorta with a maximum transverse diameter of 3.4 x 2.9 cm. There is no periaortic fluid. There is atherosclerotic calcification in the major mesenteric arterial vessels without apparent obstruction of the major mesenteric arterial vessels on noncontrast enhanced study. No adenopathy is evident in the abdomen or pelvis. Reproductive: Prostate is diffusely enlarged. Prostate abuts the inferior aspect of the urinary bladder. Seminal vesicles appear normal. No evident pelvic mass. Other: Appendix appears normal. No abscess or ascites is evident in the abdomen or pelvis. A metallic foreign body is noted anterior to the liver at the thoracic-abdominal junction. There is fat in each inguinal ring. Musculoskeletal: There is degenerative change in the lower thoracic and lumbar spine regions. No blastic or lytic bone lesions. No intramuscular or abdominal wall lesion evident. IMPRESSION: 1. Diffusely enlarged prostate impressing upon the inferior aspect of the urinary bladder. Urinary bladder is distended. Suspect a degree of bladder outlet obstruction due to the enlarged prostate. 2.  No hydronephrosis.  No renal or ureteral  calculus evident. 3. Extensive aortic and pelvic arterial vascular atherosclerosis as well as atherosclerotic change in multiple mesenteric arterial vessels. Dilatation of the distal abdominal aorta with a measured transverse diameter 3.4 x 2.9 cm. Recommend followup by ultrasound in 3 years. This recommendation follows ACR consensus guidelines: White Paper of the ACR Incidental Findings Committee II on Vascular Findings. J Am Coll Radiol 2013; 10:789-794. 4.  No bowel obstruction.  No abscess.  Appendix appears normal. Aortic Atherosclerosis (ICD10-I70.0). Electronically Signed   By: Bretta Bang III M.D.   On: 09/24/2017 07:08  Ct Head Wo Contrast  Result Date: 09/22/2017 CLINICAL DATA:  57 year old male with altered level of consciousness, difficult to arouse. Marland Kitchen IMPRESSION: Stable and negative for age noncontrast CT appearance of the brain. Electronically Signed   By: Odessa Fleming M.D.   On: 09/22/2017 23:59    Procedures Procedures (including critical care time)  Medications Ordered in ED Medications  sulfamethoxazole-trimethoprim (BACTRIM DS,SEPTRA DS) 800-160 MG per tablet 1 tablet (has no administration in time range)  acetaminophen (TYLENOL) tablet 650 mg (650 mg Oral Given 09/24/17 0511)     Initial Impression / Assessment and Plan / ED Course  I have reviewed the triage vital signs and the nursing notes.  Pertinent labs & imaging results that were available during my care of the patient were reviewed by me and considered in my medical decision making (see chart for details).    Laboratory testing was ordered including urinalysis to check for urinary tract infection and CT of the abdomen to evaluate his abdominal pain complaints.  She was started on Septra DS due to penicillin allergy for his prostatitis ordered.  Patient can follow-up later today at the [facility] to get help getting his prescription filled.  Although patient states he is suicidal he has no plan, this is a very  common complaint when he comes to the ED.  He was referred back to his mental health provider.  Bladder scan shows only 76 cc urine in the bladder so patient is not retaining urine.  He does not need a Foley catheter.  He was discharged home.  Final Clinical Impressions(s) / ED Diagnoses   Final diagnoses:  Acute prostatitis  Homeless    ED Discharge Orders        Ordered    sulfamethoxazole-trimethoprim (BACTRIM DS,SEPTRA DS) 800-160 MG tablet  2 times daily     09/24/17 0816      Plan discharge  Devoria Albe, MD, Concha Pyo, MD 09/24/17 [id number]    Devoria Albe, MD 09/24/17 [phone]

## 2017-09-24 NOTE — ED Notes (Signed)
Pt one bag of patient belongings transferred to appropriate cabinet by [facility].

## 2017-09-24 NOTE — Discharge Instructions (Signed)
[facility] and Ms Hilbert Corriganlacey can help you get your prescription filled. [facility]

## 2017-09-25 LAB — URINE CULTURE
Culture: NO GROWTH
SPECIAL REQUESTS: NORMAL

## 2017-10-02 ENCOUNTER — Other Ambulatory Visit: Payer: Self-pay

## 2017-10-02 ENCOUNTER — Encounter (HOSPITAL_COMMUNITY): Payer: Self-pay | Admitting: Emergency Medicine

## 2017-10-02 ENCOUNTER — Emergency Department (HOSPITAL_COMMUNITY)
Admission: EM | Admit: 2017-10-02 | Discharge: 2017-10-02 | Disposition: A | Discharge status: Home / Self Care | Payer: Self-pay | Attending: Emergency Medicine | Admitting: Emergency Medicine

## 2017-10-02 DIAGNOSIS — Z5321 Procedure and treatment not carried out due to patient leaving prior to being seen by health care provider: Secondary | ICD-10-CM | POA: Insufficient documentation

## 2017-10-02 DIAGNOSIS — R350 Frequency of micturition: Secondary | ICD-10-CM | POA: Insufficient documentation

## 2017-10-02 NOTE — ED Triage Notes (Addendum)
Pt arrived via EMS with complaint of urinary frequency and requesting to speak to social work. Pt states he has been having difficulty urinating on himself due to a prostate issue. Pt states he has not had his medication for his prostate issue for over a month. Pt wanting to speak to social work to get mental health assistance.

## 2017-10-02 NOTE — ED Notes (Signed)
Pt states " I feel better now, I can go."  Pt left AMA

## 2017-10-03 ENCOUNTER — Encounter (HOSPITAL_COMMUNITY): Payer: Self-pay | Admitting: Emergency Medicine

## 2017-10-03 ENCOUNTER — Emergency Department (HOSPITAL_COMMUNITY)
Admission: EM | Admit: 2017-10-03 | Discharge: 2017-10-03 | Disposition: A | Discharge status: Home / Self Care | Payer: Self-pay | Attending: Emergency Medicine | Admitting: Emergency Medicine

## 2017-10-03 ENCOUNTER — Emergency Department (HOSPITAL_COMMUNITY): Payer: Self-pay

## 2017-10-03 ENCOUNTER — Emergency Department (HOSPITAL_COMMUNITY)
Admission: EM | Admit: 2017-10-03 | Discharge: 2017-10-03 | Disposition: A | Discharge status: Home / Self Care | Payer: Self-pay | Source: Home / Self Care | Attending: Emergency Medicine | Admitting: Emergency Medicine

## 2017-10-03 DIAGNOSIS — J069 Acute upper respiratory infection, unspecified: Secondary | ICD-10-CM

## 2017-10-03 DIAGNOSIS — Z79899 Other long term (current) drug therapy: Secondary | ICD-10-CM | POA: Insufficient documentation

## 2017-10-03 DIAGNOSIS — M79671 Pain in right foot: Secondary | ICD-10-CM | POA: Insufficient documentation

## 2017-10-03 DIAGNOSIS — F17210 Nicotine dependence, cigarettes, uncomplicated: Secondary | ICD-10-CM | POA: Insufficient documentation

## 2017-10-03 DIAGNOSIS — I509 Heart failure, unspecified: Secondary | ICD-10-CM | POA: Insufficient documentation

## 2017-10-03 DIAGNOSIS — G8929 Other chronic pain: Secondary | ICD-10-CM

## 2017-10-03 DIAGNOSIS — I110 Hypertensive heart disease with heart failure: Secondary | ICD-10-CM | POA: Insufficient documentation

## 2017-10-03 DIAGNOSIS — M79672 Pain in left foot: Secondary | ICD-10-CM | POA: Insufficient documentation

## 2017-10-03 DIAGNOSIS — E1140 Type 2 diabetes mellitus with diabetic neuropathy, unspecified: Secondary | ICD-10-CM | POA: Insufficient documentation

## 2017-10-03 DIAGNOSIS — M7989 Other specified soft tissue disorders: Secondary | ICD-10-CM | POA: Insufficient documentation

## 2017-10-03 LAB — BASIC METABOLIC PANEL
ANION GAP: 11 (ref 5–15)
BUN: 10 mg/dL (ref 6–20)
CHLORIDE: 103 mmol/L (ref 101–111)
CO2: 26 mmol/L (ref 22–32)
CREATININE: 1.04 mg/dL (ref 0.61–1.24)
Calcium: 9.2 mg/dL (ref 8.9–10.3)
GFR calc non Af Amer: >60 mL/min (ref >60)
Glucose, Bld: 125 mg/dL — ABNORMAL HIGH (ref 65–99)
POTASSIUM: 3.1 mmol/L — AB (ref 3.5–5.1)
Sodium: 140 mmol/L (ref 135–145)

## 2017-10-03 LAB — CBC
HCT: 35.4 % — ABNORMAL LOW (ref 39.0–52.0)
HEMOGLOBIN: 10.9 g/dL — AB (ref 13.0–17.0)
MCH: 28.3 pg (ref 26.0–34.0)
MCHC: 30.8 g/dL (ref 30.0–36.0)
MCV: 91.9 fL (ref 78.0–100.0)
Platelets: 234 10*3/uL (ref 150–400)
RBC: 3.85 MIL/uL — AB (ref 4.22–5.81)
RDW: 13.9 % (ref 11.5–15.5)
WBC: 6.3 10*3/uL (ref 4.0–10.5)

## 2017-10-03 MED ORDER — ACETAMINOPHEN 500 MG PO TABS
1000.0000 mg | ORAL_TABLET | Freq: Once | ORAL | Status: AC
  Administered 2017-10-03: 1000 mg via ORAL
  Filled 2017-10-03: qty 2

## 2017-10-03 MED ORDER — FLUTICASONE PROPIONATE 50 MCG/ACT NA SUSP: 2.0000 | Freq: Every day | NASAL | 0 refills | Status: DC

## 2017-10-03 MED ORDER — CETIRIZINE HCL 10 MG PO CAPS: 10.0000 mg | ORAL_CAPSULE | Freq: Every day | ORAL | 0 refills | Status: DC

## 2017-10-03 MED ORDER — OXYMETAZOLINE HCL 0.05 % NA SOLN
2.0000 | Freq: Once | NASAL | Status: AC
  Administered 2017-10-03: 2 via NASAL
  Filled 2017-10-03: qty 15

## 2017-10-03 MED ORDER — ACETAMINOPHEN 500 MG PO TABS
500.0000 mg | ORAL_TABLET | Freq: Once | ORAL | Status: AC
  Administered 2017-10-03: 500 mg via ORAL
  Filled 2017-10-03: qty 1

## 2017-10-03 NOTE — ED Triage Notes (Signed)
Reports cold symptoms for two weeks. Seen yesterday for the same.

## 2017-10-03 NOTE — ED Notes (Signed)
Patient is A&Ox4.  No signs of distress noted.  Please see providers complete history and physical exam.  

## 2017-10-03 NOTE — ED Provider Notes (Signed)
TIME SEEN: 4:02 AM  CHIEF COMPLAINT: Nasal congestion, cough, bilateral foot pain and swelling  HPI: Patient is a 57 year old male with history of substance abuse, homelessness, hypertension, neuropathy, chronic foot pain who presents to the emergency department with complaints of 2 weeks of nasal congestion, cough.  States he thinks he has had a fever including currently but is afebrile here.  No vomiting or diarrhea.  No chest pain or shortness of breath.  States he thinks he needs an antibiotic.  Also complaining of bilateral foot pain and swelling.  No appreciable swelling on exam.  Has history of chronic pain.  No injury.  Is able to ambulate.  ROS: See HPI Constitutional: Subjective fever  Eyes: no drainage  ENT:  runny nose   Cardiovascular:  no chest pain  Resp: no SOB  GI: no vomiting GU: no dysuria Integumentary: no rash  Allergy: no hives  Musculoskeletal: no leg swelling  Neurological: no slurred speech ROS otherwise negative  PAST MEDICAL HISTORY/PAST SURGICAL HISTORY:  Past Medical History:  Diagnosis Date  . CHF (congestive heart failure) (HCC)   . Chronic foot pain   . Cocaine abuse (HCC)   . Diabetes mellitus without complication (HCC)   . Hepatitis C   . Homelessness   . Homelessness   . Hypertension   . Neuropathy   . Polysubstance abuse (HCC)   . Schizophrenia (HCC)     MEDICATIONS:  Prior to Admission medications   Medication Sig Start Date End Date Taking? Authorizing Provider  ARIPiprazole (ABILIFY) 5 MG tablet Take 5 mg by mouth daily.    [provider]  benztropine (COGENTIN) 1 MG tablet Take 1 mg by mouth daily.    [provider]  carbamazepine (TEGRETOL) 100 MG chewable tablet Chew 100 mg by mouth daily.    [provider]  hydrochlorothiazide (HYDRODIURIL) 25 MG tablet Take 25 mg by mouth daily.    [provider]  hydrOXYzine (ATARAX/VISTARIL) 50 MG tablet Take 50 mg by mouth 2 (two) times daily.     [provider]  lisinopril (PRINIVIL,ZESTRIL) 40 MG tablet Take 40 mg by mouth daily.    [provider]  sulfamethoxazole-trimethoprim (BACTRIM DS,SEPTRA DS) 800-160 MG tablet Take 1 tablet by mouth 2 (two) times daily. 09/24/17   Devoria AlbeKnapp, Iva, MD    ALLERGIES:  Allergies  Allergen Reactions  . Haldol [Haloperidol] Other (See Comments)    Muscle spasms, loss of voluntary movement. However, pt has taken Thorazine on multiple occasions with no adverse effects.     SOCIAL HISTORY:  Social History   Tobacco Use  . Smoking status: Current Every Day Smoker    Packs/day: 1.00    Years: 20.00    Pack years: 20.00    Types: Cigarettes  . Smokeless tobacco: Current User  Substance Use Topics  . Alcohol use: Yes    Comment: Daily Drinker     FAMILY HISTORY: Family History  Problem Relation Age of Onset  . Hypertension Other   . Diabetes Other     EXAM: BP (!) 177/94 (BP Location: Right Arm)   Pulse (!) 101   Temp 98.9 F (37.2 C) (Oral)   Resp 18   Ht 5\' 9"  (1.753 m)   Wt 79.4 kg (175 lb)   SpO2 99%   BMI 25.84 kg/m  CONSTITUTIONAL: Alert and oriented and responds appropriately to questions. Well-appearing; well-nourished HEAD: Normocephalic EYES: Conjunctivae clear, pupils appear equal, EOMI ENT: normal nose; moist mucous membranes; No pharyngeal  erythema or petechiae, no tonsillar hypertrophy or exudate, no uvular deviation, no unilateral swelling, no trismus or drooling, no muffled voice, normal phonation, no stridor, multiple dental caries present, no drainable dental abscess noted, no Ludwig's angina, tongue sits flat in the bottom of the mouth, no angioedema, no facial erythema or warmth, no facial swelling; no pain with movement of the neck.  Patient has a thick white nasal drainage noted to bilateral nostrils with inflamed bilateral nasal turbinates. NECK: Supple, no meningismus, no nuchal rigidity, no LAD  CARD: RRR; S1 and S2 appreciated; no murmurs,  no clicks, no rubs, no gallops RESP: Normal chest excursion without splinting or tachypnea; breath sounds clear and equal bilaterally; no wheezes, no rhonchi, no rales, no hypoxia or respiratory distress, speaking full sentences ABD/GI: Normal bowel sounds; non-distended; soft, non-tender, no rebound, no guarding, no peritoneal signs, no hepatosplenomegaly BACK:  The back appears normal and is non-tender to palpation, there is no CVA tenderness EXT: Normal ROM in all joints; non-tender to palpation; no edema; normal capillary refill; no cyanosis, no calf tenderness or swelling, no redness or warmth, compartments are soft, no joint effusion, 2+ DP pulses bilaterally, no swelling appreciated to either leg    SKIN: Normal color for age and race; warm; no rash NEURO: Moves all extremities equally PSYCH: The patient's mood and manner are appropriate. Grooming and personal hygiene are appropriate.  MEDICAL DECISION MAKING: Patient here with complaints of what seems like a viral upper respiratory infection.  Labs are unremarkable.  Chest x-ray clear.  We will provide Tylenol, Afrin here for symptomatic relief and was discharged with prescription of Flonase.  I do not feel he needs antibiotics.  He also complains of bilateral foot pain with no injury.  There is no sign of fracture, gout, septic arthritis, compartment syndrome, cellulitis, DVT or arterial obstruction on exam.  I feel patient is safe to be discharged home.  At this time, I do not feel there is any life-threatening condition present. I have reviewed and discussed all results (EKG, imaging, lab, urine as appropriate) and exam findings with patient/family. I have reviewed nursing notes and appropriate previous records.  I feel the patient is safe to be discharged home without further emergent workup and can continue workup as an outpatient as needed. Discussed usual and customary return precautions. Patient/family verbalize understanding and are  comfortable with this plan.  Outpatient follow-up has been provided if needed. All questions have been answered.      Ward, Layla Maw, DO 10/03/17 [phone]

## 2017-10-03 NOTE — ED Triage Notes (Signed)
Pt reports cough/nasal congestion X few days. Pt states he has a head cold. Pt seen here multiple times for mental health issues.

## 2017-10-03 NOTE — ED Notes (Signed)
PT states understanding of care given, follow up care. PT ambulated from ED to car with a steady gait.  

## 2017-10-03 NOTE — ED Provider Notes (Signed)
[facility] EMERGENCY DEPARTMENT Provider Note   CSN: [id number] Arrival date & time: 10/03/17  2006     History   Chief Complaint Chief Complaint  Patient presents with  . URI    HPI [name] is a 57 y.o. male with past medical history of polysubstance abuse, homelessness, hepatitis C, diabetes, schizophrenia, presenting to [facility] for subsequent visit today with upper respiratory complaints.  Patient was seen at 4 AM today for similar complaint, including sore throat and nasal congestion.  He states he has not taken any medications following discharge.  Per previous providers notes, he was given Tylenol and Afrin in [facility], with prescription for Flonase.  No new complaints today.  [facility] history is provided by [facility] patient.    Past Medical History:  Diagnosis Date  . CHF (congestive heart failure) (HCC)   . Chronic foot pain   . Cocaine abuse (HCC)   . Diabetes mellitus without complication (HCC)   . Hepatitis C   . Homelessness   . Homelessness   . Hypertension   . Neuropathy   . Polysubstance abuse (HCC)   . Schizophrenia [facility])     Patient Active Problem List   Diagnosis Date Noted  . Cocaine abuse with cocaine-induced mood disorder (HCC) 09/18/2017  . Chronic foot pain   . Schizoaffective disorder, bipolar type (HCC) 09/30/2016  . Suicidal thoughts   . Substance induced mood disorder (HCC) 03/13/2015  . Acute kidney failure (HCC) 01/26/2015  . Schizophrenia, paranoid type (HCC) 01/17/2015  . Suicidal ideation   . Drug hallucinosis (HCC) 10/08/2014  . Chronic paranoid schizophrenia (HCC) 09/07/2014  . 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 Acute CHF (congestive heart failure) (HCC) 07/29/2014  . Cocaine use disorder, severe, dependence (HCC)   . Cocaine abuse (HCC) 03/10/2014  . Essential hypertension, benign 03/28/2013  . Diabetes mellitus (HCC) 03/15/2013     History reviewed. No pertinent surgical history.      Home Medications    Prior to Admission medications   Medication Sig Start Date End Date Taking? Authorizing Provider  ARIPiprazole (ABILIFY) 5 MG tablet Take 5 mg by mouth daily.    [provider]  benztropine (COGENTIN) 1 MG tablet Take 1 mg by mouth daily.    [provider]  carbamazepine (TEGRETOL) 100 MG chewable tablet Chew 100 mg by mouth daily.    [provider]  Cetirizine HCl (ZYRTEC ALLERGY) 10 MG CAPS Take 1 capsule (10 mg total) by mouth daily. 10/03/17   Ward, Layla Maw, DO  fluticasone (FLONASE) 50 MCG/ACT nasal spray Place 2 sprays into both nostrils daily. 10/03/17   Ward, Layla Maw, DO  hydrochlorothiazide (HYDRODIURIL) 25 MG tablet Take 25 mg by mouth daily.    [provider]  hydrOXYzine (ATARAX/VISTARIL) 50 MG tablet Take 50 mg by mouth 2 (two) times daily.    [provider]  lisinopril (PRINIVIL,ZESTRIL) 40 MG tablet Take 40 mg by mouth daily.    [provider]  sulfamethoxazole-trimethoprim (BACTRIM DS,SEPTRA DS) 800-160 MG tablet Take 1 tablet by mouth 2 (two) times daily. 09/24/17   Devoria Albe, MD    Family History Family History  Problem Relation Age of Onset  . Hypertension Other   . Diabetes Other     Social History Social History   Tobacco Use  . Smoking status: Current Every Day Smoker    Packs/day: 1.00    Years: 20.00  Pack years: 20.00    Types: Cigarettes  . Smokeless tobacco: Current User  Substance Use Topics  . Alcohol use: Yes    Comment: Daily Drinker   . Drug use: Yes    Frequency: 7.0 times per week    Types: "Crack" cocaine, Cocaine, Marijuana    Comment: Cocaine tonight, Marijuana "a long time"     Allergies   Haldol [haloperidol]   Review of Systems Review of Systems  Constitutional: Negative for fever.  HENT: Positive for congestion and sore throat.   All other systems reviewed and are  negative.    Physical Exam Updated Vital Signs BP (!) 166/83 (BP Location: Right Arm)   Pulse 99   Temp 97.8 F (36.6 C)   Resp 18   Ht 5\' 9"  (1.753 m)   Wt 79.4 kg (175 lb)   SpO2 98%   BMI 25.84 kg/m   Physical Exam  Constitutional: He appears well-developed and well-nourished.  Severely poor hygiene.  Resting comfortably upon entering room for evaluation.  Tolerating secretions.  HENT:  Head: Normocephalic and atraumatic.  Mouth/Throat: Oropharynx is clear and moist.  Eyes: Conjunctivae are normal.  Neck: Normal range of motion. Neck supple.  Cardiovascular: Normal rate, regular rhythm and intact distal pulses.  Pulmonary/Chest: Effort normal and breath sounds normal. No respiratory distress.  Lymphadenopathy:    He has no cervical adenopathy.  Psychiatric: He has a normal mood and affect. His behavior is normal.  Nursing note and vitals reviewed.    ED Treatments / Results  Labs (all labs ordered are listed, but only abnormal results are displayed) Labs Reviewed - No data to display  EKG None  Radiology Dg Chest 2 View  Result Date: 10/03/2017 CLINICAL DATA:  Cough and congestion EXAM: CHEST - 2 VIEW COMPARISON:  03/24/2017 FINDINGS: Hyperinflation. No focal pulmonary infiltrate or effusion. Stable cardiomediastinal silhouette with aortic atherosclerosis. No pneumothorax. IMPRESSION: No active cardiopulmonary disease. Electronically Signed   By: Jasmine PangKim  Fujinaga M.D.   On: 10/03/2017 04:02    Procedures Procedures (including critical care time)  Medications Ordered in ED Medications  acetaminophen (TYLENOL) tablet 500 mg (has no administration in time range)     Initial Impression / Assessment and Plan / ED Course  I have reviewed [facility] triage vital signs and [facility] nursing notes.  Pertinent labs & imaging results that were available during my care of [facility] patient were reviewed by me and considered in my medical decision making (see chart for details).      Patient presenting to [facility] for second visit today with complaint of sore throat and congestion.  Patient was evaluated early this morning and discharged with symptomatic management.  Patient has no new complaints today.  Patient is afebrile, not in distress. ENT exam unremarkable. Patient does appear disheveled with poor hygiene.  Tylenol provided in [facility].  Safe for discharge.  Discussed results, findings, treatment and follow up. Patient advised of return precautions. Patient verbalized understanding and agreed with plan.  Final Clinical Impressions(s) / ED Diagnoses   Final diagnoses:  Viral URI    ED Discharge Orders    None       Shacarra Choe, [place]Jordan N, PA-C 10/03/17 2258    Arby BarrettePfeiffer, Marcy, MD 10/05/17 1610

## 2017-10-03 NOTE — Discharge Instructions (Addendum)
You may alternate Tylenol 1000 mg every 6 hours as needed for pain and Ibuprofen 800 mg every 8 hours as needed for pain.  Please take Ibuprofen with food.  You may use Afrin nasal spray 2 sprays in each nostril twice a day for the next 3 days.  Please do not use this medication after 3 days as it may make your nasal congestion worse.   Your labs and chest x-ray today were normal.  You do not need antibiotics.

## 2017-10-03 NOTE — Discharge Instructions (Signed)
Please read instructions below. You can take tylenol as needed for sore throat or body aches. Drink plenty of water. Follow up with your primary care provider as needed. Return to the ER for difficulty swallowing liquids, difficulty breathing, or new or worsening symptoms. ° °

## 2017-10-05 IMAGING — DX DG CHEST 2V
2 series · 2 of 2 positions shown · non-contrast
Comparison: 01/25/2017

CLINICAL DATA: Chest pain this evening.  Suicidal.

EXAM:
CHEST  2 VIEW

[chest pa]
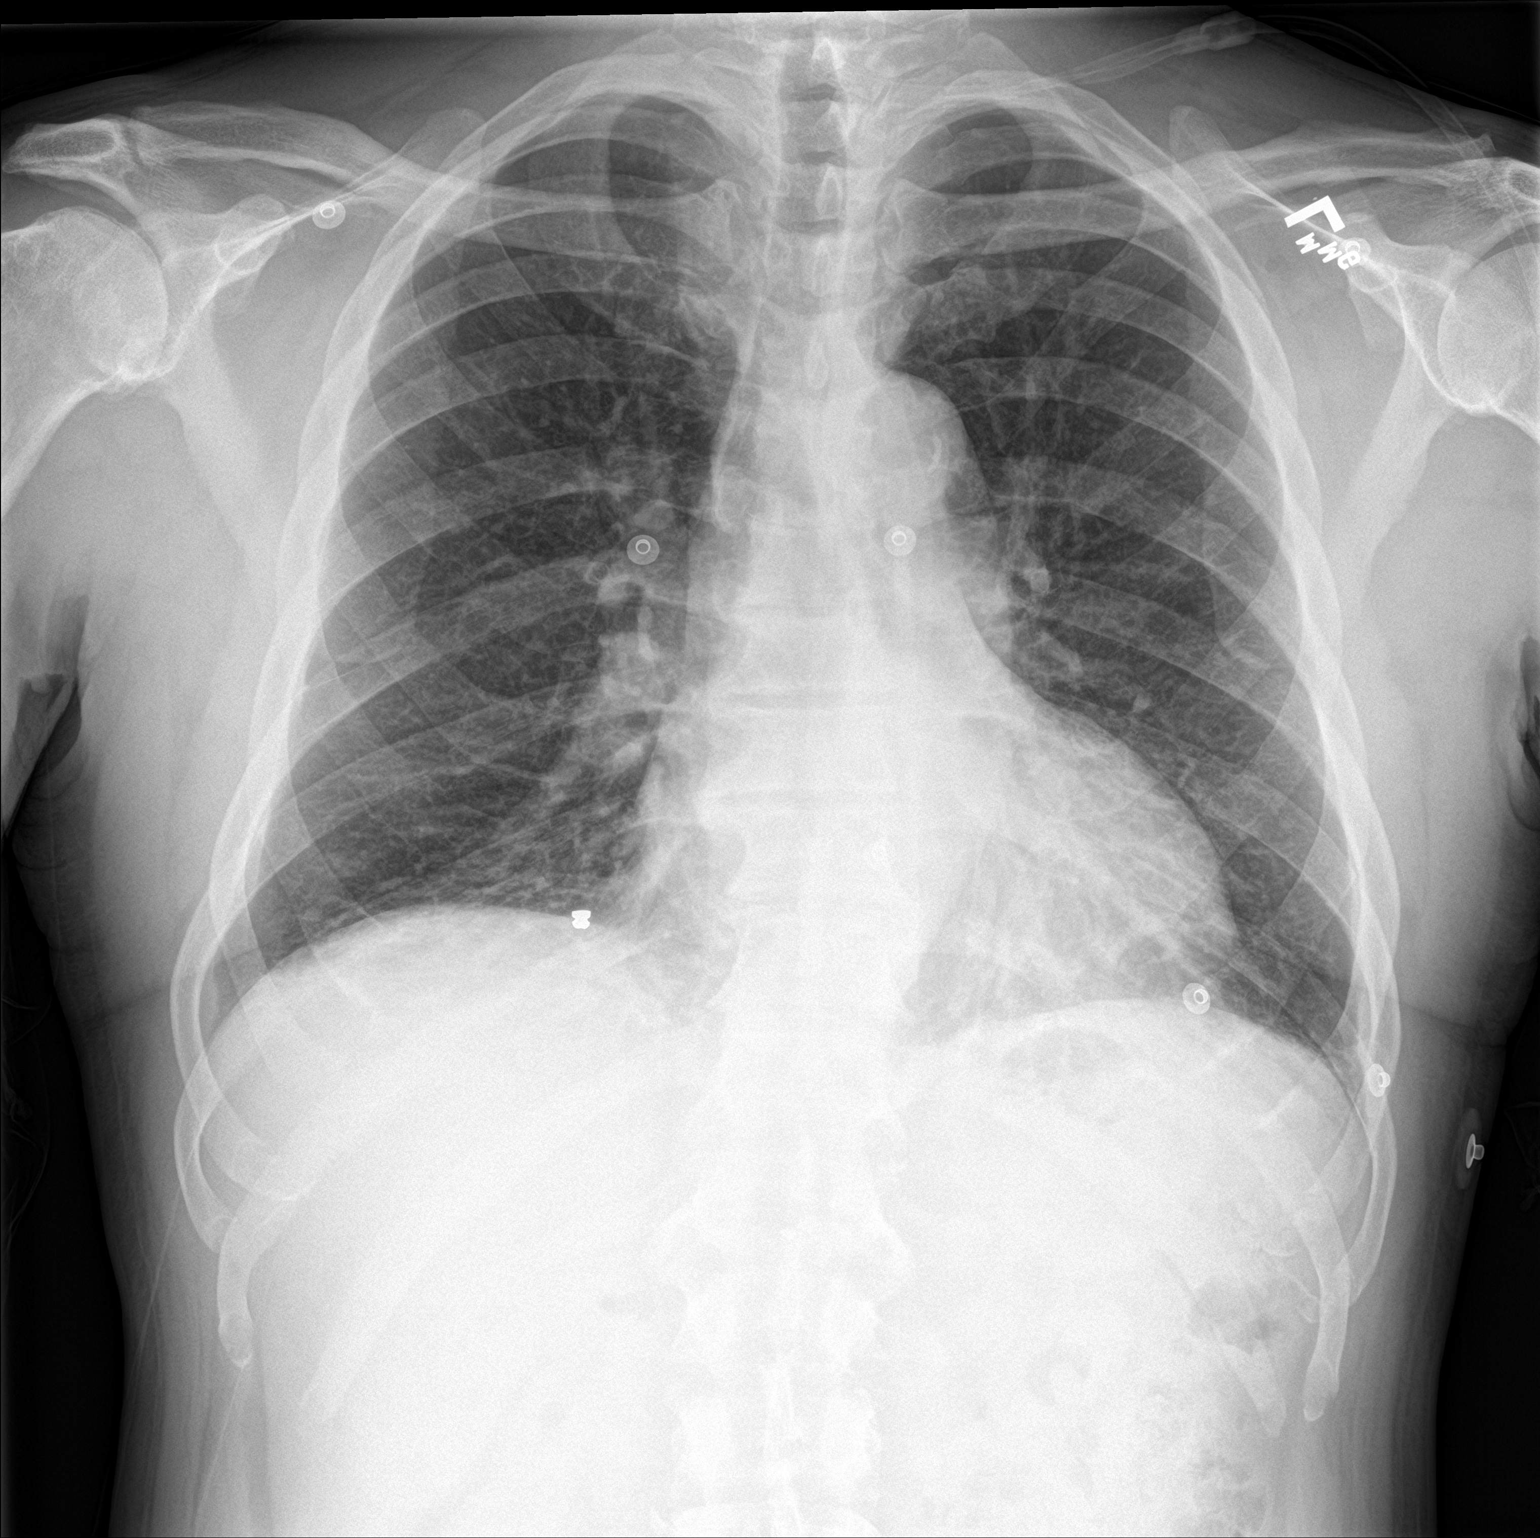

[chest lat]
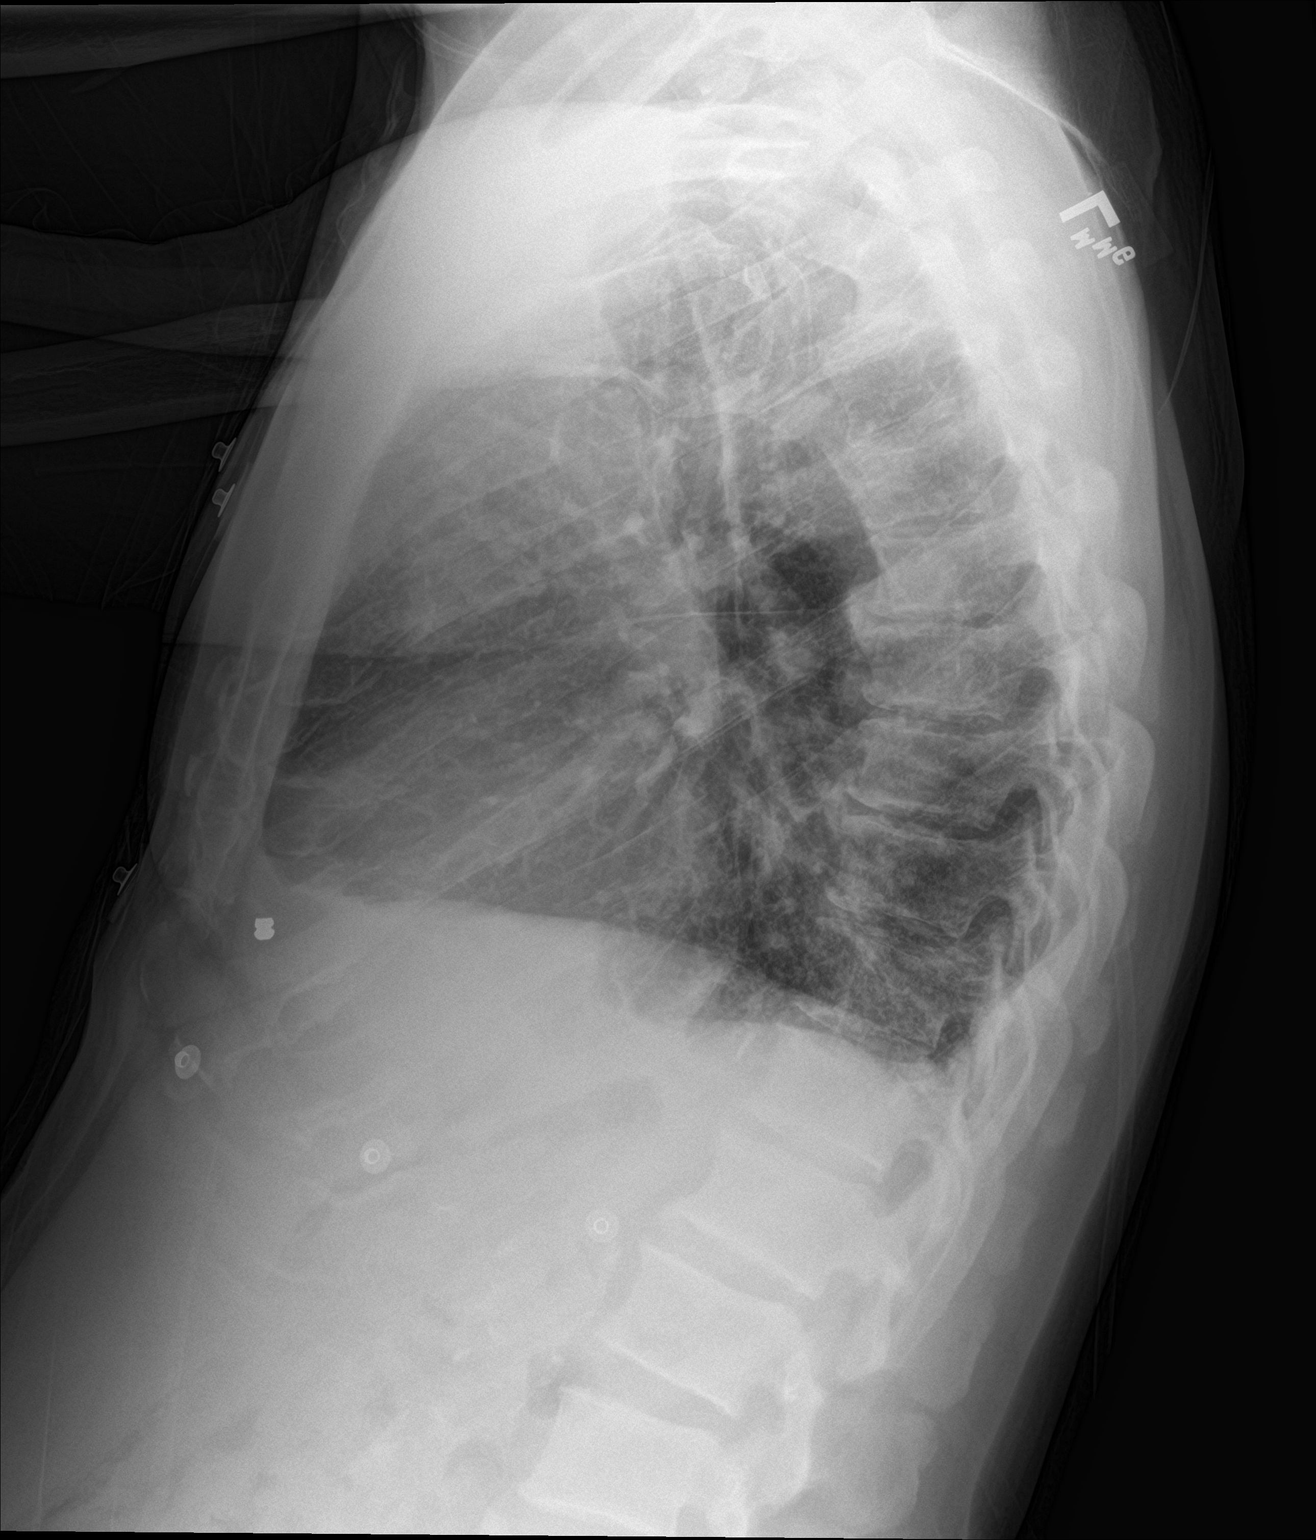

[2 of 2 positions shown; findings below may reference images not displayed]

FINDINGS: The heart size and mediastinal contours are within normal limits.
Mild uncoiling of the thoracic aorta with aortic atherosclerosis. No
aneurysm. Atelectasis is seen at the lung bases. Osteoarthritis of
the glenohumeral and AC joints with spurring.
IMPRESSION: No active cardiopulmonary disease.  Aortic atherosclerosis.

## 2017-10-06 ENCOUNTER — Emergency Department (HOSPITAL_COMMUNITY)
Admission: EM | Admit: 2017-10-06 | Discharge: 2017-10-07 | Disposition: A | Discharge status: Home / Self Care | Payer: Self-pay | Attending: Emergency Medicine | Admitting: Emergency Medicine

## 2017-10-06 ENCOUNTER — Other Ambulatory Visit: Payer: Self-pay

## 2017-10-06 ENCOUNTER — Encounter (HOSPITAL_COMMUNITY): Payer: Self-pay

## 2017-10-06 DIAGNOSIS — R45851 Suicidal ideations: Secondary | ICD-10-CM | POA: Insufficient documentation

## 2017-10-06 DIAGNOSIS — Z79899 Other long term (current) drug therapy: Secondary | ICD-10-CM | POA: Insufficient documentation

## 2017-10-06 DIAGNOSIS — Z590 Homelessness: Secondary | ICD-10-CM | POA: Insufficient documentation

## 2017-10-06 DIAGNOSIS — I509 Heart failure, unspecified: Secondary | ICD-10-CM | POA: Insufficient documentation

## 2017-10-06 DIAGNOSIS — F17210 Nicotine dependence, cigarettes, uncomplicated: Secondary | ICD-10-CM | POA: Insufficient documentation

## 2017-10-06 DIAGNOSIS — I110 Hypertensive heart disease with heart failure: Secondary | ICD-10-CM | POA: Insufficient documentation

## 2017-10-06 DIAGNOSIS — E1140 Type 2 diabetes mellitus with diabetic neuropathy, unspecified: Secondary | ICD-10-CM | POA: Insufficient documentation

## 2017-10-06 DIAGNOSIS — F209 Schizophrenia, unspecified: Secondary | ICD-10-CM | POA: Insufficient documentation

## 2017-10-06 DIAGNOSIS — F1414 Cocaine abuse with cocaine-induced mood disorder: Secondary | ICD-10-CM | POA: Insufficient documentation

## 2017-10-06 LAB — COMPREHENSIVE METABOLIC PANEL
ALBUMIN: 3.4 g/dL — AB (ref 3.5–5.0)
ALT: 17 U/L (ref 17–63)
AST: 28 U/L (ref 15–41)
Alkaline Phosphatase: 86 U/L (ref 38–126)
Anion gap: 9 (ref 5–15)
BUN: 15 mg/dL (ref 6–20)
CHLORIDE: 107 mmol/L (ref 101–111)
CO2: 25 mmol/L (ref 22–32)
CREATININE: 0.99 mg/dL (ref 0.61–1.24)
Calcium: 9.1 mg/dL (ref 8.9–10.3)
GFR calc non Af Amer: >60 mL/min (ref >60)
GLUCOSE: 160 mg/dL — AB (ref 65–99)
Potassium: 3.1 mmol/L — ABNORMAL LOW (ref 3.5–5.1)
SODIUM: 141 mmol/L (ref 135–145)
Total Bilirubin: 0.7 mg/dL (ref 0.3–1.2)
Total Protein: 6.9 g/dL (ref 6.5–8.1)

## 2017-10-06 LAB — RAPID URINE DRUG SCREEN, HOSP PERFORMED
AMPHETAMINES: NOT DETECTED
Barbiturates: NOT DETECTED
Benzodiazepines: NOT DETECTED
COCAINE: POSITIVE — AB
OPIATES: NOT DETECTED
Tetrahydrocannabinol: NOT DETECTED

## 2017-10-06 LAB — ETHANOL: Alcohol, Ethyl (B): <10 mg/dL (ref <10)

## 2017-10-06 LAB — CBC
HEMATOCRIT: 36.1 % — AB (ref 39.0–52.0)
HEMOGLOBIN: 11.1 g/dL — AB (ref 13.0–17.0)
MCH: 28.6 pg (ref 26.0–34.0)
MCHC: 30.7 g/dL (ref 30.0–36.0)
MCV: 93 fL (ref 78.0–100.0)
Platelets: 240 10*3/uL (ref 150–400)
RBC: 3.88 MIL/uL — AB (ref 4.22–5.81)
RDW: 14 % (ref 11.5–15.5)
WBC: 7.1 10*3/uL (ref 4.0–10.5)

## 2017-10-06 LAB — SALICYLATE LEVEL

## 2017-10-06 LAB — ACETAMINOPHEN LEVEL: Acetaminophen (Tylenol), Serum: <10 ug/mL — ABNORMAL LOW (ref 10–30)

## 2017-10-06 MED ORDER — ACETAMINOPHEN 325 MG PO TABS
650.0000 mg | ORAL_TABLET | Freq: Four times a day (QID) | ORAL | Status: DC | PRN
  Administered 2017-10-06 – 2017-10-07 (×2): 650 mg via ORAL
  Filled 2017-10-06 (×2): qty 2

## 2017-10-06 NOTE — ED Notes (Signed)
Bed: [facility] Expected date:  Expected time:  Means of arrival:  Comments: 

## 2017-10-06 NOTE — ED Triage Notes (Signed)
Called EMS per [employer] due to pt urinating in public sores on feet states he is suicidal now used cocaine pta to ER.

## 2017-10-06 NOTE — ED Notes (Signed)
[facility] Expected date:  Expected time:  Means of arrival:  Comments: Hold for C

## 2017-10-06 NOTE — ED Notes (Signed)
Patient reports SI no plan. Patient is irritable and verbally aggressive at this time. Patient reports bilateral foot pain at this time. Encouragement and support provided and safety maintain. Q 15 min safety check in place and video monitoring.

## 2017-10-06 NOTE — ED Provider Notes (Signed)
[facility]-EMERGENCY DEPT Provider Note   CSN: [id number] Arrival date & time: 10/06/17  0504     History   Chief Complaint Chief Complaint  Patient presents with  . Suicidal    HPI [name] is a 57 y.o. male.  The history is provided by the patient and medical records.     57 year old male with history of CHF, chronic foot pain, polysubstance abuse, hepatitis C, schizophrenia, homelessness, presenting to the ED with suicidal ideations.  [employer] was talking with patient due to urinating in public, he then began complaining that he was suicidal and needed to come to the hospital.  He cannot give me any specific plan.  He denies any homicidal ideation.  States he did use cocaine prior to arrival.  Past Medical History:  Diagnosis Date  . CHF (congestive heart failure) (HCC)   . Chronic foot pain   . Cocaine abuse (HCC)   . Diabetes mellitus without complication (HCC)   . Hepatitis C   . Homelessness   . Homelessness   . Hypertension   . Neuropathy   . Polysubstance abuse (HCC)   . Schizophrenia [facility])     Patient Active Problem List   Diagnosis Date Noted  . Cocaine abuse with cocaine-induced mood disorder (HCC) 09/18/2017  . Chronic foot pain   . Schizoaffective disorder, bipolar type (HCC) 09/30/2016  . Suicidal thoughts   . Substance induced mood disorder (HCC) 03/13/2015  . Acute kidney failure (HCC) 01/26/2015  . Schizophrenia, paranoid type (HCC) 01/17/2015  . Suicidal ideation   . Drug hallucinosis (HCC) 10/08/2014  . Chronic paranoid schizophrenia (HCC) 09/07/2014  . 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 Acute CHF (congestive heart failure) (HCC) 07/29/2014  . Cocaine use disorder, severe, dependence (HCC)   . Cocaine abuse (HCC) 03/10/2014  . Essential hypertension, benign 03/28/2013  . Diabetes mellitus (HCC) 03/15/2013    History reviewed. No  pertinent surgical history.      Home Medications    Prior to Admission medications   Medication Sig Start Date End Date Taking? Authorizing Provider  ARIPiprazole (ABILIFY) 5 MG tablet Take 5 mg by mouth daily.    [provider]  benztropine (COGENTIN) 1 MG tablet Take 1 mg by mouth daily.    [provider]  carbamazepine (TEGRETOL) 100 MG chewable tablet Chew 100 mg by mouth daily.    [provider]  Cetirizine HCl (ZYRTEC ALLERGY) 10 MG CAPS Take 1 capsule (10 mg total) by mouth daily. 10/03/17   Ward, Layla Maw, DO  fluticasone (FLONASE) 50 MCG/ACT nasal spray Place 2 sprays into both nostrils daily. 10/03/17   Ward, Layla Maw, DO  hydrochlorothiazide (HYDRODIURIL) 25 MG tablet Take 25 mg by mouth daily.    [provider]  hydrOXYzine (ATARAX/VISTARIL) 50 MG tablet Take 50 mg by mouth 2 (two) times daily.    [provider]  lisinopril (PRINIVIL,ZESTRIL) 40 MG tablet Take 40 mg by mouth daily.    [provider]  sulfamethoxazole-trimethoprim (BACTRIM DS,SEPTRA DS) 800-160 MG tablet Take 1 tablet by mouth 2 (two) times daily. 09/24/17   Devoria Albe, MD    Family History Family History  Problem Relation Age of Onset  . Hypertension Other   . Diabetes Other     Social History Social History   Tobacco Use  . Smoking status: Current Every Day Smoker    Packs/day: 1.00    Years: 20.00  Pack years: 20.00    Types: Cigarettes  . Smokeless tobacco: Current User  Substance Use Topics  . Alcohol use: Yes    Comment: Daily Drinker   . Drug use: Yes    Frequency: 7.0 times per week    Types: "Crack" cocaine, Cocaine, Marijuana    Comment: Cocaine tonight, Marijuana "a long time"     Allergies   Haldol [haloperidol]   Review of Systems Review of Systems  Psychiatric/Behavioral: Positive for suicidal ideas.  All other systems reviewed and are negative.    Physical Exam Updated Vital Signs BP (!) 173/101 (BP  Location: Left Arm)   Pulse 97   Temp 99 F (37.2 C) (Oral)   Resp 16   Ht 5\' 9"  (1.753 m)   Wt 79.4 kg (175 lb)   SpO2 98%   BMI 25.84 kg/m   Physical Exam  Constitutional: He is oriented to person, place, and time. He appears well-developed and well-nourished.  Disheveled appearing  HENT:  Head: Normocephalic and atraumatic.  Mouth/Throat: Oropharynx is clear and moist.  Eyes: Pupils are equal, round, and reactive to light. Conjunctivae and EOM are normal.  Neck: Normal range of motion.  Cardiovascular: Normal rate, regular rhythm and normal heart sounds.  Pulmonary/Chest: Effort normal and breath sounds normal. No stridor. No respiratory distress.  Abdominal: Soft. Bowel sounds are normal. There is no tenderness. There is no rebound.  Musculoskeletal: Normal range of motion.  Neurological: He is alert and oriented to person, place, and time.  Skin: Skin is warm and dry.  Psychiatric: He has a normal mood and affect. He expresses suicidal ideation.  SI without plan Denies HI/AVH  Nursing note and vitals reviewed.    ED Treatments / Results  Labs (all labs ordered are listed, but only abnormal results are displayed) Labs Reviewed  COMPREHENSIVE METABOLIC PANEL - Abnormal; Notable for the following components:      Result Value   Potassium 3.1 (*)    Glucose, Bld 160 (*)    Albumin 3.4 (*)    All other components within normal limits  ACETAMINOPHEN LEVEL - Abnormal; Notable for the following components:   Acetaminophen (Tylenol), Serum <10 (*)    All other components within normal limits  CBC - Abnormal; Notable for the following components:   RBC 3.88 (*)    Hemoglobin 11.1 (*)    HCT 36.1 (*)    All other components within normal limits  RAPID URINE DRUG SCREEN, HOSP PERFORMED - Abnormal; Notable for the following components:   Cocaine POSITIVE (*)    All other components within normal limits  ETHANOL  SALICYLATE LEVEL     EKG None  Radiology No results  found.  Procedures Procedures (including critical care time)  Medications Ordered in ED Medications - No data to display   Initial Impression / Assessment and Plan / ED Course  I have reviewed the triage vital signs and the nursing notes.  Pertinent labs & imaging results that were available during my care of the patient were reviewed by me and considered in my medical decision making (see chart for details).  57 y.o. M here with SI-- this began after he was confronted by police for urinating in public.  He cannot give me a specific plan.  Denies HI/AVH.  Does report using cocaine PTA.  Screening labs overall reassuring.  Will get TTS consult.  Disposition per their recommendations.  Final Clinical Impressions(s) / ED Diagnoses   Final diagnoses:  Suicidal  ideation    ED Discharge Orders    None       Garlon HatchetSanders, Diandra Cimini M, PA-C 10/06/17 [id number]    Gilda CreasePollina, Christopher J, MD 10/06/17 [phone]

## 2017-10-06 NOTE — ED Notes (Signed)
Patient's door was closed. Patient was asleep in the stretcher chair. Door open. Patient awakened and instructed that the door had to remain open. Patient grunted.

## 2017-10-07 MED ORDER — CARBAMAZEPINE 100 MG PO CHEW
100.0000 mg | CHEWABLE_TABLET | Freq: Every day | ORAL | Status: DC
  Filled 2017-10-07: qty 1

## 2017-10-07 MED ORDER — ARIPIPRAZOLE 5 MG PO TABS: 5.0000 mg | ORAL_TABLET | Freq: Every day | ORAL | Status: DC

## 2017-10-07 MED ORDER — BENZTROPINE MESYLATE 1 MG PO TABS: 1.0000 mg | ORAL_TABLET | Freq: Every day | ORAL | Status: DC

## 2017-10-07 NOTE — ED Notes (Signed)
Patient has constantly demanded food and soda all night long. Patient had to constantly be reminded of rules during the shift.

## 2017-10-07 NOTE — BH Assessment (Signed)
[facility]  Per Juanetta BeetsJacqueline Norman, DO, this pt does not require psychiatric hospitalization at this time.  [facility]  This has been included in pt's discharge instructions.  Social work department agrees to follow up with patient regarding other psychosocial needs.  Pt's nurse, Christen BameRonnie, has been notified.  Doylene Canninghomas Josceline Chenard, MA Triage Specialist [phone]

## 2017-10-07 NOTE — Discharge Instructions (Signed)
For your mental health needs, you are advised to follow up with [name].  New and returning patients are seen at their walk-in clinic.  Walk-in hours are Monday - Friday from 8:00 am - 3:00 pm.  Walk-in patients are seen on a first come, first served basis.  Try to arrive as early as possible for he best chance of being seen the same day: ° °     [name] °     [address] °     [place], [place] [address] °     [phone] °

## 2017-10-07 NOTE — BH Assessment (Signed)
Assessment Note  [name] is an 57 y.o. male.  -Clinician reviewed note by Sharilyn Sites, PA.  57 year old male with history of CHF, chronic foot pain, polysubstance abuse, hepatitis C, schizophrenia, homelessness, presenting to the ED with suicidal ideations.  [employer] was talking with patient due to urinating in public, he then began complaining that he was suicidal and needed to come to the hospital.  He cannot give me any specific plan.  He denies any homicidal ideation.  States he did use cocaine prior to arrival.  Patient is drowsy when assessed.  Pt states he feels like killing himself but has no plan.  No HI or A/V hallucinations.    Pt has no outpatient provider.  He says it does not help.  He wants to go to long term care like at [facility].   Pt is positive for cocaine.  He cannot give details about drug use however.  Pt is irritable and wants to go back to sleep.  -Clinician discussed patient care with [name], PA who recommends psychiatry review patient in AM.  In the meantime monitor patient for safety.  Diagnosis: F20.9 Schizophrenia  Past Medical History:  Past Medical History:  Diagnosis Date  . CHF (congestive heart failure) (HCC)   . Chronic foot pain   . Cocaine abuse (HCC)   . Diabetes mellitus without complication (HCC)   . Hepatitis C   . Homelessness   . Homelessness   . Hypertension   . Neuropathy   . Polysubstance abuse (HCC)   . Schizophrenia (HCC)     History reviewed. No pertinent surgical history.  Family History:  Family History  Problem Relation Age of Onset  . Hypertension Other   . Diabetes Other     Social History:  reports that he has been smoking cigarettes.  He has a 20.00 pack-year smoking history. He uses smokeless tobacco. He reports that he drinks alcohol. He reports that he has current or past drug history. Drugs: "Crack" cocaine, Cocaine, and Marijuana. Frequency: 7.00 times per week.  Additional Social History:  Alcohol /  Drug Use Pain Medications: See PTA medication list Prescriptions: See PTA medication list Over the Counter: See PTA medication list History of alcohol / drug use?: Yes Substance #1 Name of Substance 1: Marijuana 1 - Age of First Use: Teens 1 - Amount (size/oz): Unknown 1 - Frequency: Varies 1 - Duration: off and on  1 - Last Use / Amount: Pt can't remember Substance #2 Name of Substance 2: Cocaine 2 - Age of First Use: Unknown 2 - Amount (size/oz): Varies 2 - Frequency: Varies 2 - Duration: on-going 2 - Last Use / Amount: Can't remember  CIWA: CIWA-Ar BP: (!) 154/80 Pulse Rate: 96 COWS:    Allergies:  Allergies  Allergen Reactions  . Haldol [Haloperidol] Other (See Comments)    Muscle spasms, loss of voluntary movement. However, pt has taken Thorazine on multiple occasions with no adverse effects.     Home Medications:  (Not in a hospital admission)  OB/GYN Status:  No LMP for male patient.  General Assessment Data Assessment unable to be completed: Yes Reason for not completing assessment: Pt is actively impaired Location of Assessment: [facility] TTS Assessment: In system Is this a Tele or Face-to-Face Assessment?: Face-to-Face Is this an Initial Assessment or a Re-assessment for this encounter?: Initial Assessment Marital status: Single Is patient pregnant?: No Pregnancy Status: No Living Arrangements: Other (Comment)(Pt is homeless.) Can pt return to current living arrangement?:  Yes Admission Status: Voluntary Is patient capable of signing voluntary admission?: Yes Referral Source: Self/Family/Friend Insurance type: self pay     Crisis Care Plan Living Arrangements: Other (Comment)(Pt is homeless.) Name of Psychiatrist: None Name of Therapist: None  Education Status Is patient currently in school?: No Is the patient employed, unemployed or receiving disability?: Unemployed  Risk to self with the past 6 months Suicidal Ideation: Yes-Currently  Present Has patient been a risk to self within the past 6 months prior to admission? : No Suicidal Intent: No Has patient had any suicidal intent within the past 6 months prior to admission? : No Is patient at risk for suicide?: No Suicidal Plan?: No Has patient had any suicidal plan within the past 6 months prior to admission? : No Access to Means: No What has been your use of drugs/alcohol within the last 12 months?: Cocaine Previous Attempts/Gestures: Yes How many times?: 0 Other Self Harm Risks: None Triggers for Past Attempts: Unpredictable Intentional Self Injurious Behavior: None Family Suicide History: No Recent stressful life event(s): Other (Comment)(Homelessness) Persecutory voices/beliefs?: Yes Depression: Yes Depression Symptoms: Despondent, Isolating, Feeling angry/irritable Substance abuse history and/or treatment for substance abuse?: Yes Suicide prevention information given to non-admitted patients: Not applicable  Risk to Others within the past 6 months Homicidal Ideation: No Does patient have any lifetime risk of violence toward others beyond the six months prior to admission? : No Thoughts of Harm to Others: No Current Homicidal Intent: No Current Homicidal Plan: No Access to Homicidal Means: No Identified Victim: No one History of harm to others?: No Assessment of Violence: None Noted Violent Behavior Description: None reported Does patient have access to weapons?: No Criminal Charges Pending?: No Does patient have a court date: No Is patient on probation?: No  Psychosis Hallucinations: None noted Delusions: None noted  Mental Status Report Appearance/Hygiene: Body odor, Poor hygiene, In scrubs Eye Contact: Poor Motor Activity: Freedom of movement, Unsteady Speech: Soft, Slow Level of Consciousness: Drowsy Mood: Depressed, Sad Affect: Appropriate to circumstance Anxiety Level: Moderate Thought Processes: Coherent, Relevant Judgement:  Impaired Orientation: Appropriate for developmental age Obsessive Compulsive Thoughts/Behaviors: None  Cognitive Functioning Concentration: Decreased Memory: Recent Impaired, Remote Intact Is patient IDD: No Is patient DD?: No Insight: Poor Impulse Control: Poor Appetite: Fair Have you had any weight changes? : No Change Sleep: No Change Total Hours of Sleep: 7 Vegetative Symptoms: None  ADLScreening [facility] Assessment Services) Patient's cognitive ability adequate to safely complete daily activities?: Yes Patient able to express need for assistance with ADLs?: Yes Independently performs ADLs?: Yes (appropriate for developmental age)  Prior Inpatient Therapy Prior Inpatient Therapy: Yes Prior Therapy Dates: Pt states 2 years ago [facility] Reason for Treatment: MH issues  Prior Outpatient Therapy Prior Outpatient Therapy: No Does patient have an ACCT team?: No Does patient have Intensive In-House Services?  : No Does patient have [name] services? : No Does patient have P4CC services?: No  ADL Screening (condition at time of admission) Patient's cognitive ability adequate to safely complete daily activities?: Yes Is the patient deaf or have difficulty hearing?: No Does the patient have difficulty seeing, even when wearing glasses/contacts?: No Does the patient have difficulty concentrating, remembering, or making decisions?: Yes Patient able to express need for assistance with ADLs?: Yes Does the patient have difficulty dressing or bathing?: No Independently performs ADLs?: Yes (appropriate for developmental age) Does the patient have difficulty walking or climbing stairs?: Yes Weakness of Legs: Both Weakness of Arms/Hands: None  Abuse/Neglect Assessment (Assessment to be complete while patient is alone) Physical Abuse: Denies Verbal Abuse: Denies Sexual Abuse: Denies Exploitation of patient/patient's resources: Denies Self-Neglect:  Denies     Merchant [employer] officer (For Healthcare) Does Patient Have a Medical Advance Directive?: No Would patient like information on creating a medical advance directive?: No - Patient declined          Disposition:  Disposition Initial Assessment Completed for this Encounter: Yes Patient referred to: Other (Comment)(To be reviewed by PA)  On Site Evaluation by:   Reviewed with Physician:    Beatriz Stallion Ray 10/07/2017 5:02 AM

## 2017-10-07 NOTE — [facility] Suicide Risk Assessment (Signed)
Suicide Risk Assessment  Discharge Assessment   [facility]   Principal Problem: <principal problem not specified> Discharge Diagnoses:  Patient Active Problem List   Diagnosis Date Noted  . Cocaine abuse with cocaine-induced mood disorder (HCC) [F14.14] 09/18/2017  . Chronic foot pain [M79.673, G89.29]   . Schizoaffective disorder, bipolar type (HCC) [F25.0] 09/30/2016  . Suicidal thoughts [R45.851]   . Substance induced mood disorder (HCC) [F19.94] 03/13/2015  . Acute kidney failure (HCC) [N17.9] 01/26/2015  . Schizophrenia, paranoid type (HCC) [F20.0] 01/17/2015  . Suicidal ideation [R45.851]   . 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 Substance or medication-induced bipolar and related disorder with onset during intoxication (HCC) [Q65.784[F19.929, F19.94] 08/10/2014  . Acute CHF (congestive heart failure) (HCC) [I50.9] 07/29/2014  . Cocaine use disorder, severe, dependence (HCC) [F14.20]   . Cocaine abuse (HCC) [F14.10] 03/10/2014  . Essential hypertension, benign [I10] 03/28/2013  . Diabetes mellitus (HCC) [E11.9] 03/15/2013    Total Time spent with patient: 30 minutes  Musculoskeletal: Strength & Muscle Tone: within normal limits Gait & Station: normal Patient leans: N/A  Psychiatric Specialty Exam:   Blood pressure (!) 167/87, pulse 62, temperature 98.1 F (36.7 C), temperature source Oral, resp. rate 14, height 5\' 9"  (1.753 m), weight 79.4 kg (175 lb), SpO2 92 %.Body mass index is 25.84 kg/m.  General Appearance: Casual  Eye Contact::  Good  Speech:  Clear and Coherent409  Volume:  Normal  Mood:  Anxious and Depressed  Affect:  Congruent and Depressed  Thought Process:  Coherent and Linear  Orientation:  Full (Time, Place, and Person)  Thought Content:  Logical  Suicidal Thoughts:  No  Homicidal Thoughts:  No  Memory:  Immediate;   Good Recent;   Good Remote;   Fair   Judgement:  Fair  Insight:  Fair  Psychomotor Activity:  Normal  Concentration:  Good  Recall:  Good  Fund of Knowledge:Good  Language: Good  Akathisia:  No  Handed:  Right  AIMS (if indicated):     Assets:  Communication Skills Desire for Improvement Social Support  Sleep:     Cognition: WNL  ADL's:  Intact   Mental Status Per Nursing Assessment::   On Admission:   substance abuse issues  Demographic Factors:  Male, Low socioeconomic status and Unemployed  Loss Factors: Financial problems/change in socioeconomic status  Historical Factors: Impulsivity  Risk Reduction Factors:   Sense of responsibility to family  Continued Clinical Symptoms:  Bipolar Disorder:   Mixed State Alcohol/Substance Abuse/Dependencies Schizophrenia:   Paranoid or undifferentiated type More than one psychiatric diagnosis Previous Psychiatric Diagnoses and Treatments Medical Diagnoses and Treatments/Surgeries  Cognitive Features That Contribute To Risk:  Closed-mindedness    Suicide Risk:  Minimal: No identifiable suicidal ideation.  Patients presenting with no risk factors but with morbid ruminations; may be classified as minimal risk based on the severity of the depressive symptoms    Plan Of Care/Follow-up recommendations:  Activity:  as tolerated  Diet:  Heart healthy  Laveda AbbeLaurie Britton Kamylah Manzo, NP 10/07/2017, 1:00 PM

## 2017-10-09 ENCOUNTER — Emergency Department (HOSPITAL_COMMUNITY)
Admission: EM | Admit: 2017-10-09 | Discharge: 2017-10-10 | Discharge status: Against Medical Advice | Payer: Self-pay | Attending: Emergency Medicine | Admitting: Emergency Medicine

## 2017-10-09 DIAGNOSIS — R339 Retention of urine, unspecified: Secondary | ICD-10-CM | POA: Insufficient documentation

## 2017-10-09 DIAGNOSIS — F1410 Cocaine abuse, uncomplicated: Secondary | ICD-10-CM | POA: Insufficient documentation

## 2017-10-09 DIAGNOSIS — F17210 Nicotine dependence, cigarettes, uncomplicated: Secondary | ICD-10-CM | POA: Insufficient documentation

## 2017-10-09 DIAGNOSIS — I509 Heart failure, unspecified: Secondary | ICD-10-CM | POA: Insufficient documentation

## 2017-10-09 DIAGNOSIS — Z5320 Procedure and treatment not carried out because of patient's decision for unspecified reasons: Secondary | ICD-10-CM | POA: Insufficient documentation

## 2017-10-09 DIAGNOSIS — E119 Type 2 diabetes mellitus without complications: Secondary | ICD-10-CM | POA: Insufficient documentation

## 2017-10-09 DIAGNOSIS — Z590 Homelessness: Secondary | ICD-10-CM | POA: Insufficient documentation

## 2017-10-09 DIAGNOSIS — R443 Hallucinations, unspecified: Secondary | ICD-10-CM | POA: Insufficient documentation

## 2017-10-09 DIAGNOSIS — I10 Essential (primary) hypertension: Secondary | ICD-10-CM | POA: Insufficient documentation

## 2017-10-10 ENCOUNTER — Encounter (HOSPITAL_COMMUNITY): Payer: Self-pay | Admitting: *Deleted

## 2017-10-10 ENCOUNTER — Other Ambulatory Visit: Payer: Self-pay

## 2017-10-10 LAB — RAPID URINE DRUG SCREEN, HOSP PERFORMED
Amphetamines: NOT DETECTED
BENZODIAZEPINES: NOT DETECTED
Barbiturates: NOT DETECTED
COCAINE: POSITIVE — AB
OPIATES: NOT DETECTED
Tetrahydrocannabinol: NOT DETECTED

## 2017-10-10 LAB — COMPREHENSIVE METABOLIC PANEL
ALBUMIN: 3.4 g/dL — AB (ref 3.5–5.0)
ALK PHOS: 87 U/L (ref 38–126)
ALT: 13 U/L — ABNORMAL LOW (ref 17–63)
ANION GAP: 9 (ref 5–15)
AST: 17 U/L (ref 15–41)
BUN: 12 mg/dL (ref 6–20)
CALCIUM: 9.2 mg/dL (ref 8.9–10.3)
CHLORIDE: 110 mmol/L (ref 101–111)
CO2: 23 mmol/L (ref 22–32)
Creatinine, Ser: 0.87 mg/dL (ref 0.61–1.24)
GFR calc Af Amer: >60 mL/min (ref >60)
GFR calc non Af Amer: >60 mL/min (ref >60)
GLUCOSE: 112 mg/dL — AB (ref 65–99)
Potassium: 3.4 mmol/L — ABNORMAL LOW (ref 3.5–5.1)
SODIUM: 142 mmol/L (ref 135–145)
Total Bilirubin: 1.2 mg/dL (ref 0.3–1.2)
Total Protein: 7.1 g/dL (ref 6.5–8.1)

## 2017-10-10 LAB — CBC WITH DIFFERENTIAL/PLATELET
BASOS PCT: 0 %
Basophils Absolute: 0 10*3/uL (ref 0.0–0.1)
EOS ABS: 0.1 10*3/uL (ref 0.0–0.7)
Eosinophils Relative: 2 %
HCT: 36.1 % — ABNORMAL LOW (ref 39.0–52.0)
HEMOGLOBIN: 11.3 g/dL — AB (ref 13.0–17.0)
Lymphocytes Relative: 27 %
Lymphs Abs: 1.5 10*3/uL (ref 0.7–4.0)
MCH: 28.8 pg (ref 26.0–34.0)
MCHC: 31.3 g/dL (ref 30.0–36.0)
MCV: 92.1 fL (ref 78.0–100.0)
MONOS PCT: 9 %
Monocytes Absolute: 0.5 10*3/uL (ref 0.1–1.0)
NEUTROS PCT: 62 %
Neutro Abs: 3.3 10*3/uL (ref 1.7–7.7)
Platelets: 235 10*3/uL (ref 150–400)
RBC: 3.92 MIL/uL — AB (ref 4.22–5.81)
RDW: 14.3 % (ref 11.5–15.5)
WBC: 5.4 10*3/uL (ref 4.0–10.5)

## 2017-10-10 LAB — SALICYLATE LEVEL

## 2017-10-10 LAB — ETHANOL: Alcohol, Ethyl (B): <10 mg/dL (ref <10)

## 2017-10-10 LAB — ACETAMINOPHEN LEVEL: Acetaminophen (Tylenol), Serum: <10 ug/mL — ABNORMAL LOW (ref 10–30)

## 2017-10-10 LAB — CARBAMAZEPINE LEVEL, TOTAL

## 2017-10-10 NOTE — ED Notes (Addendum)
Pt stating "I'm here to see Social Work.  [facility]  I spent [place] in the pen.  I was in the [employer].  I don't have no clothes 'cause I pissed myself.  [place] going back to [place] if I have to walk backwards and kiss ass.  I pissed in my pants and I threw them away.  The social worker will give me more clothes tomorrow."

## 2017-10-10 NOTE — ED Triage Notes (Signed)
Pt stated "I asked the police to bring me here.  People kill you these days.  I'll do whatever you say, 1 question @ a time.  Security can handle my ass.  I'm old as [place]."

## 2017-10-10 NOTE — ED Provider Notes (Signed)
[facility]   CSN: [id number] Arrival date & time: 10/09/17  2241     History   Chief Complaint Chief Complaint  Patient presents with  . social work  . Manic Behavior    HPI BUREN HAVEY is a 57 y.o. male.  Patient well-known to the ED.  States he has "snakes all over him".  States he has not had his medication for 30 days ever since he was released from prison.  He was seen in the ED on April 8 for the same situation.  States he is suicidal but does not have an active plan.  He states he has had difficulty with urination.  He has been using cocaine last used 2 days ago.  Denies any alcohol or other drug use.  The history is provided by a caregiver.    Past Medical History:  Diagnosis Date  . CHF (congestive heart failure) (HCC)   . Chronic foot pain   . Cocaine abuse (HCC)   . Diabetes mellitus without complication (HCC)   . Hepatitis C   . Homelessness   . Homelessness   . Hypertension   . Neuropathy   . Polysubstance abuse (HCC)   . Schizophrenia [facility])     Patient Active Problem List   Diagnosis Date Noted  . Cocaine abuse with cocaine-induced mood disorder (HCC) 09/18/2017  . Chronic foot pain   . Schizoaffective disorder, bipolar type (HCC) 09/30/2016  . Suicidal thoughts   . Substance induced mood disorder (HCC) 03/13/2015  . Acute kidney failure (HCC) 01/26/2015  . Schizophrenia, paranoid type (HCC) 01/17/2015  . Suicidal ideation   . Drug hallucinosis (HCC) 10/08/2014  . Chronic paranoid schizophrenia (HCC) 09/07/2014  . 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 Acute CHF (congestive heart failure) (HCC) 07/29/2014  . Cocaine use disorder, severe, dependence (HCC)   . Cocaine abuse (HCC) 03/10/2014  . Essential hypertension, benign 03/28/2013  . Diabetes mellitus (HCC) 03/15/2013    History reviewed. No pertinent surgical  history.      Home Medications    Prior to Admission medications   Medication Sig Start Date End Date Taking? Authorizing Provider  ARIPiprazole (ABILIFY) 5 MG tablet Take 5 mg by mouth daily.    [provider]  benztropine (COGENTIN) 1 MG tablet Take 1 mg by mouth daily.    [provider]  carbamazepine (TEGRETOL) 100 MG chewable tablet Chew 100 mg by mouth daily.    [provider]  Cetirizine HCl (ZYRTEC ALLERGY) 10 MG CAPS Take 1 capsule (10 mg total) by mouth daily. 10/03/17   Ward, Layla Maw, DO  fluticasone (FLONASE) 50 MCG/ACT nasal spray Place 2 sprays into both nostrils daily. 10/03/17   Ward, Layla Maw, DO  hydrochlorothiazide (HYDRODIURIL) 25 MG tablet Take 25 mg by mouth daily.    [provider]  hydrOXYzine (ATARAX/VISTARIL) 50 MG tablet Take 50 mg by mouth 2 (two) times daily.    [provider]  lisinopril (PRINIVIL,ZESTRIL) 40 MG tablet Take 40 mg by mouth daily.    [provider]  sulfamethoxazole-trimethoprim (BACTRIM DS,SEPTRA DS) 800-160 MG tablet Take 1 tablet by mouth 2 (two) times daily. 09/24/17   Devoria Albe, MD    Family History Family History  Problem Relation Age of Onset  . Hypertension Other   . Diabetes Other     Social History Social History   Tobacco Use  . Smoking  status: Current Every Day Smoker    Packs/day: 1.00    Years: 20.00    Pack years: 20.00    Types: Cigarettes  . Smokeless tobacco: Current User  Substance Use Topics  . Alcohol use: Yes    Comment: Daily Drinker   . Drug use: Yes    Frequency: 7.0 times per week    Types: "Crack" cocaine, Cocaine, Marijuana    Comment: Cocaine tonight, Marijuana "a long time"     Allergies   Haldol [haloperidol]   Review of Systems Review of Systems  Constitutional: Negative for activity change, appetite change and fever.  HENT: Negative for congestion.   Respiratory: Negative for chest tightness and shortness of breath.     Cardiovascular: Negative for chest pain.  Gastrointestinal: Negative for abdominal pain, nausea and vomiting.  Genitourinary: Positive for difficulty urinating.  Skin: Negative for rash.  Psychiatric/Behavioral: Positive for behavioral problems, decreased concentration, hallucinations and suicidal ideas. The patient is nervous/anxious and is hyperactive.     all other systems are negative except as noted in the HPI and PMH.    Physical Exam Updated Vital Signs BP (!) 169/96 (BP Location: Right Arm)   Pulse 94   Temp 97.6 F (36.4 C) (Oral)   Resp 16   Ht 5\' 9"  (1.753 m)   Wt 79.4 kg (175 lb)   SpO2 100%   BMI 25.84 kg/m   Physical Exam  Constitutional: He is oriented to person, place, and time. He appears well-developed and well-nourished. No distress.  disheveled  HENT:  Head: Normocephalic and atraumatic.  Mouth/Throat: Oropharynx is clear and moist. No oropharyngeal exudate.  Eyes: Pupils are equal, round, and reactive to light. Conjunctivae and EOM are normal.  Neck: Normal range of motion. Neck supple.  No meningismus.  Cardiovascular: Normal rate, regular rhythm, normal heart sounds and intact distal pulses.  No murmur heard. Pulmonary/Chest: Effort normal and breath sounds normal. No respiratory distress. He exhibits no tenderness.  Abdominal: Soft. There is tenderness. There is no rebound and no guarding.  Palpable bladder  Musculoskeletal: Normal range of motion. He exhibits no edema or tenderness.  Neurological: He is alert and oriented to person, place, and time. No cranial nerve deficit. He exhibits normal muscle tone. Coordination normal.  CN 2-12 intact. 5/5 strength throughout. Ambulatory without a problem.  No active suicidal plan.  Oriented to person place and time.  Skin: Skin is warm. Capillary refill takes less than 2 seconds. No rash noted.  Psychiatric: He has a normal mood and affect. His behavior is normal.  Nursing note and vitals  reviewed.    ED Treatments / Results  Labs (all labs ordered are listed, but only abnormal results are displayed) Labs Reviewed  CBC WITH DIFFERENTIAL/PLATELET - Abnormal; Notable for the following components:      Result Value   RBC 3.92 (*)    Hemoglobin 11.3 (*)    HCT 36.1 (*)    All other components within normal limits  COMPREHENSIVE METABOLIC PANEL - Abnormal; Notable for the following components:   Potassium 3.4 (*)    Glucose, Bld 112 (*)    Albumin 3.4 (*)    ALT 13 (*)    All other components within normal limits  ACETAMINOPHEN LEVEL - Abnormal; Notable for the following components:   Acetaminophen (Tylenol), Serum <10 (*)    All other components within normal limits  RAPID URINE DRUG SCREEN, HOSP PERFORMED - Abnormal; Notable for the following components:   Cocaine  POSITIVE (*)    All other components within normal limits  CARBAMAZEPINE LEVEL, TOTAL - Abnormal; Notable for the following components:   Carbamazepine Lvl <2.0 (*)    All other components within normal limits  ETHANOL  SALICYLATE LEVEL    EKG None  Radiology No results found.  Procedures Procedures (including critical care time)  Medications Ordered in ED Medications - No data to display   Initial Impression / Assessment and Plan / ED Course  I have reviewed the triage vital signs and the nursing notes.  Pertinent labs & imaging results that were available during my care of the patient were reviewed by me and considered in my medical decision making (see chart for details).    Patient complains of suicidal ideation and hallucinations. States he has not had his medications for a month. Seen by TTS two days ago for similar symptoms and cleared.   Labs are reassuring. UDS positive for cocaine. Patient has palpable bladder. On bladder scan, he has greater than 900 cc of urine. Patient adamantly refusing foley catheter or in and out catheter.  States he is urinating on his own.  D/w patient  that chronic urinary retention can cause problems such as kidney failure.  Patient is not suicidal or homicidal on reassessment. He denies hallucinations. He states he is leaving and continues to refuse foley catheter. He does not meet IVC criteria.  He will leave AMA.   He is tolerating PO and ambulatory. Does not appear to be an immediate threat to himself or others. He appears to have capacity to refuse foley catheter and will leave against medical advice.  Final Clinical Impressions(s) / ED Diagnoses   Final diagnoses:  Urinary retention    ED Discharge Orders    None       Jevin Camino, Jeannett SeniorStephen, MD 10/10/17 [phone]

## 2017-10-10 NOTE — ED Notes (Signed)
Pt now hitting self in head, is talking to someone that is not there.

## 2017-10-10 NOTE — ED Notes (Signed)
Dr. Manus Gunningancour informed of pt's refusal of foley cath & pt wanting to leave.

## 2017-10-10 NOTE — ED Notes (Signed)
Pt cursing stating "you ain't going to stick nothing in my d---.  Give me my papers, I'll leave.  I done over 30 years.  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

## 2017-10-10 NOTE — ED Notes (Signed)
Bed: WA26 Expected date:  Expected time:  Means of arrival:  Comments: 

## 2017-10-10 NOTE — ED Notes (Signed)
Pt given urinal and encouraged to void.  Pt stated "yes, ma'am.  I'll go when I have to."

## 2017-10-12 ENCOUNTER — Encounter (HOSPITAL_COMMUNITY): Payer: Self-pay | Admitting: Emergency Medicine

## 2017-10-12 ENCOUNTER — Emergency Department (HOSPITAL_COMMUNITY)
Admission: EM | Admit: 2017-10-12 | Discharge: 2017-10-12 | Disposition: A | Discharge status: Home / Self Care | Payer: Self-pay | Attending: Emergency Medicine | Admitting: Emergency Medicine

## 2017-10-12 ENCOUNTER — Other Ambulatory Visit: Payer: Self-pay

## 2017-10-12 ENCOUNTER — Emergency Department (HOSPITAL_COMMUNITY)
Admission: EM | Admit: 2017-10-12 | Discharge: 2017-10-13 | Disposition: A | Discharge status: Home / Self Care | Payer: Self-pay | Source: Home / Self Care | Attending: Emergency Medicine | Admitting: Emergency Medicine

## 2017-10-12 DIAGNOSIS — M79671 Pain in right foot: Secondary | ICD-10-CM

## 2017-10-12 DIAGNOSIS — I110 Hypertensive heart disease with heart failure: Secondary | ICD-10-CM | POA: Insufficient documentation

## 2017-10-12 DIAGNOSIS — F209 Schizophrenia, unspecified: Secondary | ICD-10-CM | POA: Insufficient documentation

## 2017-10-12 DIAGNOSIS — Z590 Homelessness: Secondary | ICD-10-CM | POA: Insufficient documentation

## 2017-10-12 DIAGNOSIS — M79672 Pain in left foot: Secondary | ICD-10-CM | POA: Insufficient documentation

## 2017-10-12 DIAGNOSIS — E119 Type 2 diabetes mellitus without complications: Secondary | ICD-10-CM | POA: Insufficient documentation

## 2017-10-12 DIAGNOSIS — Z79899 Other long term (current) drug therapy: Secondary | ICD-10-CM

## 2017-10-12 DIAGNOSIS — M79673 Pain in unspecified foot: Principal | ICD-10-CM

## 2017-10-12 DIAGNOSIS — I10 Essential (primary) hypertension: Secondary | ICD-10-CM

## 2017-10-12 DIAGNOSIS — G8929 Other chronic pain: Secondary | ICD-10-CM

## 2017-10-12 DIAGNOSIS — I509 Heart failure, unspecified: Secondary | ICD-10-CM | POA: Insufficient documentation

## 2017-10-12 DIAGNOSIS — F17210 Nicotine dependence, cigarettes, uncomplicated: Secondary | ICD-10-CM

## 2017-10-12 DIAGNOSIS — F1410 Cocaine abuse, uncomplicated: Secondary | ICD-10-CM | POA: Insufficient documentation

## 2017-10-12 LAB — URINALYSIS, ROUTINE W REFLEX MICROSCOPIC
BILIRUBIN URINE: NEGATIVE
Bacteria, UA: NONE SEEN
Glucose, UA: NEGATIVE mg/dL
HGB URINE DIPSTICK: NEGATIVE
KETONES UR: NEGATIVE mg/dL
LEUKOCYTES UA: NEGATIVE
Nitrite: NEGATIVE
Protein, ur: 30 mg/dL — AB
SQUAMOUS EPITHELIAL / LPF: NONE SEEN
Specific Gravity, Urine: 1.019 (ref 1.005–1.030)
pH: 6 (ref 5.0–8.0)

## 2017-10-12 LAB — RAPID URINE DRUG SCREEN, HOSP PERFORMED
Amphetamines: NOT DETECTED
BENZODIAZEPINES: NOT DETECTED
Barbiturates: NOT DETECTED
COCAINE: POSITIVE — AB
Opiates: NOT DETECTED
Tetrahydrocannabinol: NOT DETECTED

## 2017-10-12 LAB — COMPREHENSIVE METABOLIC PANEL
ALK PHOS: 78 U/L (ref 38–126)
ALT: 15 U/L — AB (ref 17–63)
AST: 30 U/L (ref 15–41)
Albumin: 3.4 g/dL — ABNORMAL LOW (ref 3.5–5.0)
Anion gap: 7 (ref 5–15)
BILIRUBIN TOTAL: 0.7 mg/dL (ref 0.3–1.2)
BUN: 11 mg/dL (ref 6–20)
CALCIUM: 9.1 mg/dL (ref 8.9–10.3)
CO2: 25 mmol/L (ref 22–32)
CREATININE: 1.01 mg/dL (ref 0.61–1.24)
Chloride: 110 mmol/L (ref 101–111)
GFR calc Af Amer: >60 mL/min (ref >60)
Glucose, Bld: 134 mg/dL — ABNORMAL HIGH (ref 65–99)
Potassium: 3.8 mmol/L (ref 3.5–5.1)
Sodium: 142 mmol/L (ref 135–145)
TOTAL PROTEIN: 6.3 g/dL — AB (ref 6.5–8.1)

## 2017-10-12 LAB — CBC
HCT: 34.6 % — ABNORMAL LOW (ref 39.0–52.0)
Hemoglobin: 10.9 g/dL — ABNORMAL LOW (ref 13.0–17.0)
MCH: 29 pg (ref 26.0–34.0)
MCHC: 31.5 g/dL (ref 30.0–36.0)
MCV: 92 fL (ref 78.0–100.0)
PLATELETS: 217 10*3/uL (ref 150–400)
RBC: 3.76 MIL/uL — ABNORMAL LOW (ref 4.22–5.81)
RDW: 14.5 % (ref 11.5–15.5)
WBC: 7.1 10*3/uL (ref 4.0–10.5)

## 2017-10-12 LAB — ETHANOL

## 2017-10-12 MED ORDER — ACETAMINOPHEN 325 MG PO TABS: 650.0000 mg | ORAL_TABLET | Freq: Once | ORAL | Status: DC

## 2017-10-12 NOTE — Discharge Instructions (Addendum)
Take your medications as prescribed. Follow up with your doctor. Return to the ED if you develop new or worsening symptoms.  °

## 2017-10-12 NOTE — ED Provider Notes (Signed)
[facility] EMERGENCY DEPARTMENT Provider Note   CSN: [id number] Arrival date & time: 10/12/17  0008     History   Chief Complaint Chief Complaint  Patient presents with  . Foot Pain    HPI [name] is a 57 y.o. male.  Patient well known to the ED. Hx of homelessness, schizophrenia, substance abuse. Presents with bilateral foot pain for past several days. Seen by myself 2 nights ago for urinary retention and patient refused foley cathter.  States his feet hurt from walking. Asking for snack and tylenol. Also wants dry clothes. Denies SI, HI, hallucinations.  The history is provided by the patient. The history is limited by the condition of the patient.  Foot Pain     Past Medical History:  Diagnosis Date  . CHF (congestive heart failure) (HCC)   . Chronic foot pain   . Cocaine abuse (HCC)   . Diabetes mellitus without complication (HCC)   . Hepatitis C   . Homelessness   . Homelessness   . Hypertension   . Neuropathy   . Polysubstance abuse (HCC)   . Schizophrenia [facility])     Patient Active Problem List   Diagnosis Date Noted  . Cocaine abuse with cocaine-induced mood disorder (HCC) 09/18/2017  . Chronic foot pain   . Schizoaffective disorder, bipolar type (HCC) 09/30/2016  . Suicidal thoughts   . Substance induced mood disorder (HCC) 03/13/2015  . Acute kidney failure (HCC) 01/26/2015  . Schizophrenia, paranoid type (HCC) 01/17/2015  . Suicidal ideation   . Drug hallucinosis (HCC) 10/08/2014  . Chronic paranoid schizophrenia (HCC) 09/07/2014  . 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 Acute CHF (congestive heart failure) (HCC) 07/29/2014  . Cocaine use disorder, severe, dependence (HCC)   . Cocaine abuse (HCC) 03/10/2014  . Essential hypertension, benign 03/28/2013  . Diabetes mellitus (HCC) 03/15/2013    History reviewed. No pertinent surgical  history.      Home Medications    Prior to Admission medications   Medication Sig Start Date End Date Taking? Authorizing Provider  ARIPiprazole (ABILIFY) 5 MG tablet Take 5 mg by mouth daily.    [provider]  benztropine (COGENTIN) 1 MG tablet Take 1 mg by mouth daily.    [provider]  carbamazepine (TEGRETOL) 100 MG chewable tablet Chew 100 mg by mouth daily.    [provider]  Cetirizine HCl (ZYRTEC ALLERGY) 10 MG CAPS Take 1 capsule (10 mg total) by mouth daily. 10/03/17   Ward, Layla Maw, DO  fluticasone (FLONASE) 50 MCG/ACT nasal spray Place 2 sprays into both nostrils daily. 10/03/17   Ward, Layla Maw, DO  hydrochlorothiazide (HYDRODIURIL) 25 MG tablet Take 25 mg by mouth daily.    [provider]  hydrOXYzine (ATARAX/VISTARIL) 50 MG tablet Take 50 mg by mouth 2 (two) times daily.    [provider]  lisinopril (PRINIVIL,ZESTRIL) 40 MG tablet Take 40 mg by mouth daily.    [provider]  sulfamethoxazole-trimethoprim (BACTRIM DS,SEPTRA DS) 800-160 MG tablet Take 1 tablet by mouth 2 (two) times daily. 09/24/17   Devoria Albe, MD    Family History Family History  Problem Relation Age of Onset  . Hypertension Other   . Diabetes Other     Social History Social History   Tobacco Use  . Smoking status: Current Every Day Smoker    Packs/day: 1.00    Years: 20.00    Pack  years: 20.00    Types: Cigarettes  . Smokeless tobacco: Current User  Substance Use Topics  . Alcohol use: Yes    Comment: Daily Drinker   . Drug use: Yes    Frequency: 7.0 times per week    Types: "Crack" cocaine, Cocaine, Marijuana    Comment: Cocaine tonight, Marijuana "a long time"     Allergies   Haldol [haloperidol]   Review of Systems Review of Systems  Unable to perform ROS: Psychiatric disorder     Physical Exam Updated Vital Signs There were no vitals taken for this visit.  Physical Exam  Constitutional: He is oriented to  person, place, and time. He appears well-developed and well-nourished. No distress.  Nontoxic. Well appearing.  Argumentative.   HENT:  Head: Normocephalic and atraumatic.  Mouth/Throat: Oropharynx is clear and moist. No oropharyngeal exudate.  Eyes: Pupils are equal, round, and reactive to light. Conjunctivae and EOM are normal.  Neck: Normal range of motion. Neck supple.  No meningismus.  Cardiovascular: Normal rate, regular rhythm, normal heart sounds and intact distal pulses.  No murmur heard. Pulmonary/Chest: Effort normal and breath sounds normal. No respiratory distress.  Abdominal: Soft. There is no tenderness. There is no rebound and no guarding.  Refuses abdominal exam.  Genitourinary:  Genitourinary Comments: Refuses GU exam.  Musculoskeletal: Normal range of motion. He exhibits tenderness. He exhibits no edema.  Minimal LE edema Feet normal to inspection other than bilateral calcaneal calluses. Intact DP and PT pulses. FROM toes, ankles, knees Compartments soft.   Neurological: He is alert and oriented to person, place, and time. No cranial nerve deficit. He exhibits normal muscle tone. Coordination normal.  No ataxia on finger to nose bilaterally. No pronator drift. 5/5 strength throughout. CN 2-12 intact.Equal grip strength. Sensation intact.   Skin: Skin is warm.  Psychiatric: He has a normal mood and affect. His behavior is normal.  Nursing note and vitals reviewed.    ED Treatments / Results  Labs (all labs ordered are listed, but only abnormal results are displayed) Labs Reviewed - No data to display  EKG None  Radiology No results found.  Procedures Procedures (including critical care time)  Medications Ordered in ED Medications  acetaminophen (TYLENOL) tablet 650 mg (has no administration in time range)     Initial Impression / Assessment and Plan / ED Course  I have reviewed the triage vital signs and the nursing notes.  Pertinent labs &  imaging results that were available during my care of the patient were reviewed by me and considered in my medical decision making (see chart for details).     Foot pain from walking and being homeless. No evidence of infection or neurovascular compromise.   Patient bathed, fed, given tylenol.  He refuses abdominal exam and gets angry when asked about his recent visit or his urinary habits. Refuses GU exam.  Adamantly denies SI, HI, hallucinations.  Appears to be at his baseline and stable for discharge.   Final Clinical Impressions(s) / ED Diagnoses   Final diagnoses:  Bilateral foot pain    ED Discharge Orders    None       Rebekkah Powless, Jeannett SeniorStephen, MD 10/12/17 [phone]

## 2017-10-12 NOTE — ED Triage Notes (Signed)
Pt is homeless. Complaints of foot pain from walking. Feet and clothing wet. Requesting dry clothes d/t incontinence.  Pt states he doesn't want to stay, just to see social work, get dry clothes, and a snack with some tylenol for pain.

## 2017-10-12 NOTE — ED Triage Notes (Signed)
Pt c/o bilat foot pain, pt requesting inpt detox from cocaine. Pt states he has used @ $20 worth today. Rapid speech, erratic movements. Pt denies SI/HI Pt requesting foot soak, Tylenol and nicotine patch.

## 2017-10-12 NOTE — ED Notes (Signed)
Pt stated he needs Tylenol for his feet and he needs the demons to stop talking to him in his head.

## 2017-10-13 ENCOUNTER — Other Ambulatory Visit: Payer: Self-pay

## 2017-10-13 ENCOUNTER — Emergency Department (HOSPITAL_COMMUNITY)
Admission: EM | Admit: 2017-10-13 | Discharge: 2017-10-14 | Disposition: A | Discharge status: Home / Self Care | Payer: Self-pay | Attending: Emergency Medicine | Admitting: Emergency Medicine

## 2017-10-13 ENCOUNTER — Encounter (HOSPITAL_COMMUNITY): Payer: Self-pay | Admitting: Emergency Medicine

## 2017-10-13 DIAGNOSIS — R509 Fever, unspecified: Secondary | ICD-10-CM | POA: Insufficient documentation

## 2017-10-13 DIAGNOSIS — Z5321 Procedure and treatment not carried out due to patient leaving prior to being seen by health care provider: Secondary | ICD-10-CM | POA: Insufficient documentation

## 2017-10-13 MED ORDER — LISINOPRIL 40 MG PO TABS: 40.0000 mg | ORAL_TABLET | Freq: Every day | ORAL | 0 refills | Status: DC

## 2017-10-13 MED ORDER — HYDROCHLOROTHIAZIDE 25 MG PO TABS: 25.0000 mg | ORAL_TABLET | Freq: Every day | ORAL | 0 refills | Status: DC

## 2017-10-13 MED ORDER — ACETAMINOPHEN 500 MG PO TABS
1000.0000 mg | ORAL_TABLET | Freq: Once | ORAL | Status: AC
Start: 2017-10-13 — End: 2017-10-13
  Administered 2017-10-13: 1000 mg via ORAL
  Filled 2017-10-13: qty 2

## 2017-10-13 NOTE — ED Notes (Signed)
[facility]

## 2017-10-13 NOTE — ED Notes (Signed)
Pt agitated.  Speaking to himself.

## 2017-10-13 NOTE — ED Notes (Signed)
Pt refused discharge vital signs and left for the waiting room.

## 2017-10-13 NOTE — ED Triage Notes (Addendum)
Pt reports uri symptoms since last week, denies fever and cough. Homeless. Denies SI/HI at this time.

## 2017-10-13 NOTE — ED Provider Notes (Signed)
TIME SEEN: 1:26 AM  CHIEF COMPLAINT: Chronic foot pain, substance abuse  HPI: Patient is a 57 year old male with history of hypertension, diabetes, substance abuse, schizophrenia, chronic foot pain who presents to the emergency department with bilateral foot pain.  States he feels like his feet are swollen.  Asking for Tylenol for pain.  No injury to his feet.  Is able to ambulate.  Does report using cocaine today.  Denies any SI or HI.  Asking for help with a place to stay in a bus pass.  Asking for something to eat.  ROS: See HPI Constitutional: no fever  Eyes: no drainage  ENT: no runny nose   Cardiovascular:  no chest pain  Resp: no SOB  GI: no vomiting GU: no dysuria Integumentary: no rash  Allergy: no hives  Musculoskeletal: no leg swelling  Neurological: no slurred speech ROS otherwise negative  PAST MEDICAL HISTORY/PAST SURGICAL HISTORY:  Past Medical History:  Diagnosis Date  . CHF (congestive heart failure) (HCC)   . Chronic foot pain   . Cocaine abuse (HCC)   . Diabetes mellitus without complication (HCC)   . Hepatitis C   . Homelessness   . Homelessness   . Hypertension   . Neuropathy   . Polysubstance abuse (HCC)   . Schizophrenia (HCC)     MEDICATIONS:  Prior to Admission medications   Medication Sig Start Date End Date Taking? Authorizing Provider  ARIPiprazole (ABILIFY) 5 MG tablet Take 5 mg by mouth daily.    [provider]  benztropine (COGENTIN) 1 MG tablet Take 1 mg by mouth daily.    [provider]  carbamazepine (TEGRETOL) 100 MG chewable tablet Chew 100 mg by mouth daily.    [provider]  Cetirizine HCl (ZYRTEC ALLERGY) 10 MG CAPS Take 1 capsule (10 mg total) by mouth daily. 10/03/17   Shloka Baldridge, Layla Maw, DO  fluticasone (FLONASE) 50 MCG/ACT nasal spray Place 2 sprays into both nostrils daily. 10/03/17   Glema Takaki, Layla Maw, DO  hydrochlorothiazide (HYDRODIURIL) 25 MG tablet Take 25 mg by mouth daily.    [provider]   hydrOXYzine (ATARAX/VISTARIL) 50 MG tablet Take 50 mg by mouth 2 (two) times daily.    [provider]  lisinopril (PRINIVIL,ZESTRIL) 40 MG tablet Take 40 mg by mouth daily.    [provider]  sulfamethoxazole-trimethoprim (BACTRIM DS,SEPTRA DS) 800-160 MG tablet Take 1 tablet by mouth 2 (two) times daily. 09/24/17   Devoria Albe, MD    ALLERGIES:  Allergies  Allergen Reactions  . Haldol [Haloperidol] Other (See Comments)    Muscle spasms, loss of voluntary movement. However, pt has taken Thorazine on multiple occasions with no adverse effects.     SOCIAL HISTORY:  Social History   Tobacco Use  . Smoking status: Current Every Day Smoker    Packs/day: 1.00    Years: 20.00    Pack years: 20.00    Types: Cigarettes  . Smokeless tobacco: Current User  Substance Use Topics  . Alcohol use: Yes    Comment: Daily Drinker     FAMILY HISTORY: Family History  Problem Relation Age of Onset  . Hypertension Other   . Diabetes Other     EXAM: BP (!) 180/115 (BP Location: Right Arm)   Pulse 85   Temp 98.4 F (36.9 C) (Oral)   Resp 18   Ht 5\' 9"  (1.753 m)   Wt 79.4 kg (175 lb)   SpO2 100%   BMI 25.84 kg/m  CONSTITUTIONAL: Alert and oriented and responds appropriately to questions. Well-appearing; well-nourished HEAD: Normocephalic EYES: Conjunctivae clear, pupils appear equal, EOMI ENT: normal nose; moist mucous membranes NECK: Supple, no meningismus, no nuchal rigidity, no LAD  CARD: RRR; S1 and S2 appreciated; no murmurs, no clicks, no rubs, no gallops RESP: Normal chest excursion without splinting or tachypnea; breath sounds clear and equal bilaterally; no wheezes, no rhonchi, no rales, no hypoxia or respiratory distress, speaking full sentences ABD/GI: Normal bowel sounds; non-distended; soft, non-tender, no rebound, no guarding, no peritoneal signs, no hepatosplenomegaly BACK:  The back appears normal and is non-tender to palpation, there is no CVA  tenderness EXT: Normal ROM in all joints; non-tender to palpation; no edema; normal capillary refill; no cyanosis, no calf tenderness or swelling, no joint effusion, no redness or warmth, 2+ DP pulses bilaterally, no lesions on his legs noted    SKIN: Normal color for age and race; warm; no rash NEURO: Moves all extremities equally PSYCH: The patient's mood and manner are appropriate. Grooming and personal hygiene are appropriate.  MEDICAL DECISION MAKING: Patient here for chronic foot pain.  No sign of any injury.  No sign of septic arthritis, gout, arterial obstruction, DVT, fracture.  Has history of homelessness and bilateral chronic foot pain.  Is hypertensive here but this appears to be his baseline.  Labs, urine obtained in triage which show no acute abnormality.  He is positive for cocaine which is chronic for him.  Will refill his blood pressure medications.  I have given him Tylenol for his foot pain and given him something to eat and drink.  I feel he is safe for discharge.  Will provide outpatient resources.  I do not feel he needs emergent psychiatric evaluation or hospitalization at this time.  At this time, I do not feel there is any life-threatening condition present. I have reviewed and discussed all results (EKG, imaging, lab, urine as appropriate) and exam findings with patient/family. I have reviewed nursing notes and appropriate previous records.  I feel the patient is safe to be discharged home without further emergent workup and can continue workup as an outpatient as needed. Discussed usual and customary return precautions. Patient/family verbalize understanding and are comfortable with this plan.  Outpatient follow-up has been provided if needed. All questions have been answered.      Zubair Lofton, Layla MawKristen N, DO 10/13/17 [phone]

## 2017-10-13 NOTE — ED Notes (Signed)
Called for Security to talk to pt b/c pt is getting upset with staff b/c we wont give pt the meds he wants. Pt stated if we are not going to treat him up here he wants his discharge paperwork. Told pt he will not get discharge papers until he is seen by a physician. Pt yelling at staff so security was asked to speak to Pt. Pt told security he is going to cooperate and stay.

## 2017-10-14 ENCOUNTER — Emergency Department (HOSPITAL_COMMUNITY): Admission: EM | Admit: 2017-10-14 | Discharge: 2017-10-14 | Discharge status: Against Medical Advice | Payer: Self-pay

## 2017-10-14 NOTE — ED Notes (Signed)
Pt did not answer for triage. 

## 2017-10-14 NOTE — ED Notes (Signed)
Pt LWBS 

## 2017-10-14 NOTE — ED Notes (Signed)
Pt did not answer when calledx4 . Pt removed from waiting room

## 2017-10-14 NOTE — ED Notes (Signed)
Pt updated regarding delays. Remains in lobby.

## 2017-10-15 ENCOUNTER — Other Ambulatory Visit: Payer: Self-pay

## 2017-10-15 ENCOUNTER — Encounter (HOSPITAL_COMMUNITY): Payer: Self-pay | Admitting: Emergency Medicine

## 2017-10-15 ENCOUNTER — Emergency Department (HOSPITAL_COMMUNITY)
Admission: EM | Admit: 2017-10-15 | Discharge: 2017-10-15 | Disposition: A | Discharge status: Home / Self Care | Payer: Self-pay | Attending: Emergency Medicine | Admitting: Emergency Medicine

## 2017-10-15 DIAGNOSIS — Z5321 Procedure and treatment not carried out due to patient leaving prior to being seen by health care provider: Secondary | ICD-10-CM | POA: Insufficient documentation

## 2017-10-15 DIAGNOSIS — J029 Acute pharyngitis, unspecified: Secondary | ICD-10-CM | POA: Insufficient documentation

## 2017-10-15 NOTE — ED Notes (Signed)
Pt refusing throat swab at this time.

## 2017-10-15 NOTE — ED Notes (Signed)
Patient wandering around outside, came back in wanting to know when he will be seen.  Patient directed to blue chairs to be triaged.

## 2017-10-15 NOTE — ED Triage Notes (Signed)
Patient come to this RN saying he needed to go home, couldn't wait any longer

## 2017-10-15 NOTE — ED Notes (Signed)
Writer call for triage, no response

## 2017-10-15 NOTE — ED Triage Notes (Addendum)
Pt reports sore throat and wants "lotion on his feet"

## 2017-10-15 NOTE — ED Notes (Signed)
Writer called for triage, no response.

## 2017-10-16 ENCOUNTER — Emergency Department (HOSPITAL_COMMUNITY)
Admission: EM | Admit: 2017-10-16 | Discharge: 2017-10-16 | Disposition: A | Discharge status: Home / Self Care | Payer: Self-pay | Attending: Emergency Medicine | Admitting: Emergency Medicine

## 2017-10-16 ENCOUNTER — Emergency Department (HOSPITAL_COMMUNITY)
Admission: EM | Admit: 2017-10-16 | Discharge: 2017-10-16 | Discharge status: Against Medical Advice | Payer: Medicare Other

## 2017-10-16 ENCOUNTER — Encounter (HOSPITAL_COMMUNITY): Payer: Self-pay

## 2017-10-16 DIAGNOSIS — Z5321 Procedure and treatment not carried out due to patient leaving prior to being seen by health care provider: Secondary | ICD-10-CM | POA: Insufficient documentation

## 2017-10-16 DIAGNOSIS — R2241 Localized swelling, mass and lump, right lower limb: Secondary | ICD-10-CM | POA: Insufficient documentation

## 2017-10-16 NOTE — ED Notes (Signed)
Writer call for triage, no response 

## 2017-10-16 NOTE — ED Triage Notes (Signed)
RN asked patient to come to triage for questioning; pt stated he did not need to be seen but wanted tylenol; I explained that I could not dispense tylenol without triaging him first; pt then requested a bus pass; this RN refused to give bus ticket; pt then said he needed "to pee first",  I told him to pee and that it didn't need to be supervised, pt got mad and threw his blanket down and stormed out of the ER lobby doors with steady gait

## 2017-10-16 NOTE — ED Triage Notes (Signed)
Pt reports right foot swelling and sore throat in triage. PT speech is rapid and will not allow RN to speak.

## 2017-10-16 NOTE — ED Notes (Signed)
Patient became loud with staff stating he only wanted a pair of socks.  When patient was rolled to the waiting room he began to yell stating that he "was a grown ass man and he could walk out on his own."  He got up from the wheelchair and walked out even while staff attempted to communicate

## 2017-10-16 NOTE — ED Notes (Signed)
Pt was sitting outside the door talking in word salad, asked pt if he wanted to come inside and see a doctor but he stated no that he just came for a pair of shoes. Cut pt armband off per request.  Let nurse first know.

## 2017-10-17 ENCOUNTER — Other Ambulatory Visit: Payer: Self-pay

## 2017-10-17 ENCOUNTER — Encounter (HOSPITAL_COMMUNITY): Payer: Self-pay | Admitting: Emergency Medicine

## 2017-10-17 ENCOUNTER — Emergency Department (HOSPITAL_COMMUNITY)
Admission: EM | Admit: 2017-10-17 | Discharge: 2017-10-17 | Disposition: A | Discharge status: Home / Self Care | Payer: Self-pay | Attending: Emergency Medicine | Admitting: Emergency Medicine

## 2017-10-17 DIAGNOSIS — M25571 Pain in right ankle and joints of right foot: Secondary | ICD-10-CM

## 2017-10-17 DIAGNOSIS — Z5321 Procedure and treatment not carried out due to patient leaving prior to being seen by health care provider: Secondary | ICD-10-CM | POA: Insufficient documentation

## 2017-10-17 DIAGNOSIS — M25572 Pain in left ankle and joints of left foot: Secondary | ICD-10-CM | POA: Insufficient documentation

## 2017-10-17 DIAGNOSIS — G8929 Other chronic pain: Secondary | ICD-10-CM

## 2017-10-17 NOTE — ED Notes (Signed)
Pt. Requesting new scrubs, given new scrubs to put on.

## 2017-10-17 NOTE — ED Notes (Signed)
Called pt name to recheck vitals x3. No response from pt. Pt was seen earlier in the waiting room, but is not currently seen by this NT.

## 2017-10-17 NOTE — ED Notes (Signed)
Pt walked out of the ER.

## 2017-10-17 NOTE — ED Notes (Signed)
This RN walked to room after hearing repeated loud metal banging. As I approached the room, the patient walked out of the room stating "yall don't care nothing about black boys" and ambulated independently with steady gait out of the department. Patient in no apparent distress.

## 2017-10-17 NOTE — ED Notes (Addendum)
Pt came to this tech upset about not eating for 30 days and that he is a diabetic. Checking his blood sugar was offered. Patient decided he did not want to wait and walked out the door.

## 2017-10-17 NOTE — ED Notes (Signed)
No answer when called to come to treatment room.

## 2017-10-17 NOTE — ED Triage Notes (Signed)
Pt. Stated, I have ankle pain, I twisted it last week.

## 2017-10-18 ENCOUNTER — Encounter (HOSPITAL_COMMUNITY): Payer: Self-pay | Admitting: Emergency Medicine

## 2017-10-18 ENCOUNTER — Other Ambulatory Visit: Payer: Self-pay

## 2017-10-18 ENCOUNTER — Emergency Department (HOSPITAL_COMMUNITY)
Admission: EM | Admit: 2017-10-18 | Discharge: 2017-10-18 | Disposition: A | Discharge status: Home / Self Care | Payer: Self-pay | Attending: Emergency Medicine | Admitting: Emergency Medicine

## 2017-10-18 ENCOUNTER — Emergency Department (HOSPITAL_COMMUNITY)
Admission: EM | Admit: 2017-10-18 | Discharge: 2017-10-18 | Payer: Self-pay | Attending: Emergency Medicine | Admitting: Emergency Medicine

## 2017-10-18 DIAGNOSIS — E119 Type 2 diabetes mellitus without complications: Secondary | ICD-10-CM | POA: Insufficient documentation

## 2017-10-18 DIAGNOSIS — M79672 Pain in left foot: Secondary | ICD-10-CM | POA: Insufficient documentation

## 2017-10-18 DIAGNOSIS — Z5321 Procedure and treatment not carried out due to patient leaving prior to being seen by health care provider: Secondary | ICD-10-CM | POA: Insufficient documentation

## 2017-10-18 DIAGNOSIS — M79671 Pain in right foot: Secondary | ICD-10-CM | POA: Insufficient documentation

## 2017-10-18 DIAGNOSIS — R079 Chest pain, unspecified: Secondary | ICD-10-CM | POA: Insufficient documentation

## 2017-10-18 DIAGNOSIS — Z79899 Other long term (current) drug therapy: Secondary | ICD-10-CM | POA: Insufficient documentation

## 2017-10-18 DIAGNOSIS — F17210 Nicotine dependence, cigarettes, uncomplicated: Secondary | ICD-10-CM | POA: Insufficient documentation

## 2017-10-18 DIAGNOSIS — I110 Hypertensive heart disease with heart failure: Secondary | ICD-10-CM | POA: Insufficient documentation

## 2017-10-18 DIAGNOSIS — I509 Heart failure, unspecified: Secondary | ICD-10-CM | POA: Insufficient documentation

## 2017-10-18 LAB — COMPREHENSIVE METABOLIC PANEL
ALT: 23 U/L (ref 17–63)
ANION GAP: 7 (ref 5–15)
AST: 46 U/L — AB (ref 15–41)
Albumin: 3 g/dL — ABNORMAL LOW (ref 3.5–5.0)
Alkaline Phosphatase: 93 U/L (ref 38–126)
BILIRUBIN TOTAL: 0.5 mg/dL (ref 0.3–1.2)
BUN: 18 mg/dL (ref 6–20)
CO2: 24 mmol/L (ref 22–32)
Calcium: 8.9 mg/dL (ref 8.9–10.3)
Chloride: 109 mmol/L (ref 101–111)
Creatinine, Ser: 0.84 mg/dL (ref 0.61–1.24)
Glucose, Bld: 191 mg/dL — ABNORMAL HIGH (ref 65–99)
POTASSIUM: 3.5 mmol/L (ref 3.5–5.1)
Sodium: 140 mmol/L (ref 135–145)
TOTAL PROTEIN: 5.9 g/dL — AB (ref 6.5–8.1)

## 2017-10-18 LAB — CBC
HCT: 33.6 % — ABNORMAL LOW (ref 39.0–52.0)
Hemoglobin: 10.5 g/dL — ABNORMAL LOW (ref 13.0–17.0)
MCH: 28.7 pg (ref 26.0–34.0)
MCHC: 31.3 g/dL (ref 30.0–36.0)
MCV: 91.8 fL (ref 78.0–100.0)
PLATELETS: 195 10*3/uL (ref 150–400)
RBC: 3.66 MIL/uL — ABNORMAL LOW (ref 4.22–5.81)
RDW: 14.6 % (ref 11.5–15.5)
WBC: 6.4 10*3/uL (ref 4.0–10.5)

## 2017-10-18 LAB — ETHANOL

## 2017-10-18 LAB — ACETAMINOPHEN LEVEL: Acetaminophen (Tylenol), Serum: <10 ug/mL — ABNORMAL LOW (ref 10–30)

## 2017-10-18 LAB — SALICYLATE LEVEL

## 2017-10-18 LAB — LIPASE, BLOOD: LIPASE: 28 U/L (ref 11–51)

## 2017-10-18 LAB — I-STAT TROPONIN, ED: Troponin i, poc: 0.03 ng/mL (ref 0.00–0.08)

## 2017-10-18 MED ORDER — ACETAMINOPHEN 325 MG PO TABS
650.0000 mg | ORAL_TABLET | Freq: Once | ORAL | Status: AC
  Administered 2017-10-18: 650 mg via ORAL
  Filled 2017-10-18: qty 2

## 2017-10-18 NOTE — ED Notes (Addendum)
Pt came out of triage room holding blanket cursing at staff. Security called and with pt. Pt walking out into waiting room.

## 2017-10-18 NOTE — Discharge Instructions (Signed)
Return if any problems.

## 2017-10-18 NOTE — ED Notes (Signed)
Pt was arrested by police in waiting area per security

## 2017-10-18 NOTE — ED Triage Notes (Signed)
[place] Patient called 911 due to swelling and pain in his legs. Patient refusing to answer any of EMS' questions. States to EMS that "You bitches need to get me to the hospital."

## 2017-10-18 NOTE — ED Notes (Signed)
Bed: WTR7 Expected date:  Expected time:  Means of arrival:  Comments: 

## 2017-10-18 NOTE — ED Notes (Signed)
Patient refusing to change in to purple scrubs.  Security spoke to patient and agreeing at this time

## 2017-10-18 NOTE — ED Triage Notes (Signed)
Patient presents to ED with multiple complaints, currently in blue hospital scrubs.  Patient difficult to focus his conversation, to pinpoint symptoms or to ask many details.  Patient becomes agitated when you ask him to slow down or if you ask additional questions about his symptoms.  Patient states he is homeless, just got out of prison, and has been suicidal because "I shouldn't be living on these streets".  Patient initially stated the reason for bringing him in was generalized abdominal pain with some diarrhea and swelling.  Patient also states he is having chest pain and "a flare up of my heart congestion".  Patient unable to pinpoint exact start times for any of these symptoms, becomes agitated with this RN when she asks clarifying questions.

## 2017-10-18 NOTE — ED Notes (Signed)
Pt soaked feet, changed into clean scrubs and was given food prior to leaving.

## 2017-10-18 NOTE — ED Notes (Signed)
Pt very lethargic when assessing vitals. He had to be shaken every couple seconds in order to take his temperature.

## 2017-10-18 NOTE — ED Provider Notes (Signed)
[facility]-EMERGENCY DEPT Provider Note   CSN: [id number] Arrival date & time: 10/18/17  0218     History   Chief Complaint Chief Complaint  Patient presents with  . Leg Pain  . Leg Swelling    HPI [name] is a 57 y.o. male.  The history is provided by the patient. No language interpreter was used.  Leg Pain   This is a chronic problem. The problem occurs constantly. The pain is present in the left lower leg and right lower leg. The pain is moderate. He has tried nothing for the symptoms. The treatment provided no relief.   Pt request to soak his feet, have tylenol, get clean clothes and a sandwich.  Past Medical History:  Diagnosis Date  . CHF (congestive heart failure) (HCC)   . Chronic foot pain   . Cocaine abuse (HCC)   . Diabetes mellitus without complication (HCC)   . Hepatitis C   . Homelessness   . Homelessness   . Hypertension   . Neuropathy   . Polysubstance abuse (HCC)   . Schizophrenia [facility](HCC)     Patient Active Problem List   Diagnosis Date Noted  . Cocaine abuse with cocaine-induced mood disorder (HCC) 09/18/2017  . Chronic foot pain   . Schizoaffective disorder, bipolar type (HCC) 09/30/2016  . Suicidal thoughts   . Substance induced mood disorder (HCC) 03/13/2015  . Acute kidney failure (HCC) 01/26/2015  . Schizophrenia, paranoid type (HCC) 01/17/2015  . Suicidal ideation   . Drug hallucinosis (HCC) 10/08/2014  . Chronic paranoid schizophrenia (HCC) 09/07/2014  . 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 Acute CHF (congestive heart failure) (HCC) 07/29/2014  . Cocaine use disorder, severe, dependence (HCC)   . Cocaine abuse (HCC) 03/10/2014  . Essential hypertension, benign 03/28/2013  . Diabetes mellitus (HCC) 03/15/2013    History reviewed. No pertinent surgical history.      Home Medications    Prior to Admission medications     Medication Sig Start Date End Date Taking? Authorizing Provider  ARIPiprazole (ABILIFY) 5 MG tablet Take 5 mg by mouth daily.    [provider]  benztropine (COGENTIN) 1 MG tablet Take 1 mg by mouth daily.    [provider]  carbamazepine (TEGRETOL) 100 MG chewable tablet Chew 100 mg by mouth daily.    [provider]  Cetirizine HCl (ZYRTEC ALLERGY) 10 MG CAPS Take 1 capsule (10 mg total) by mouth daily. 10/03/17   Ward, Layla MawKristen N, DO  fluticasone (FLONASE) 50 MCG/ACT nasal spray Place 2 sprays into both nostrils daily. 10/03/17   Ward, Layla MawKristen N, DO  hydrochlorothiazide (HYDRODIURIL) 25 MG tablet Take 1 tablet (25 mg total) by mouth daily. 10/13/17   Ward, Layla MawKristen N, DO  hydrOXYzine (ATARAX/VISTARIL) 50 MG tablet Take 50 mg by mouth 2 (two) times daily.    [provider]  lisinopril (PRINIVIL,ZESTRIL) 40 MG tablet Take 1 tablet (40 mg total) by mouth daily. 10/13/17   Ward, Layla MawKristen N, DO    Family History Family History  Problem Relation Age of Onset  . Hypertension Other   . Diabetes Other     Social History Social History   Tobacco Use  . Smoking status: Current Every Day Smoker    Packs/day: 1.00    Years: 20.00    Pack years: 20.00    Types: Cigarettes  . Smokeless tobacco: Current User  Substance Use Topics  .  Alcohol use: Yes    Comment: Daily Drinker   . Drug use: Yes    Frequency: 7.0 times per week    Types: "Crack" cocaine, Cocaine, Marijuana    Comment: Cocaine tonight, Marijuana "a long time"     Allergies   Haldol [haloperidol]   Review of Systems Review of Systems  All other systems reviewed and are negative.    Physical Exam Updated Vital Signs BP (!) 192/108 (BP Location: Right Arm)   Pulse 99   Temp 99.7 F (37.6 C) (Oral)   Resp 17   SpO2 97%   Physical Exam  Constitutional: He is oriented to person, place, and time. He appears well-developed and well-nourished.  Musculoskeletal: He exhibits no edema.   Neurological: He is alert and oriented to person, place, and time.  Skin: Skin is warm.  Psychiatric: He has a normal mood and affect.  Vitals reviewed.    ED Treatments / Results  Labs (all labs ordered are listed, but only abnormal results are displayed) Labs Reviewed - No data to display  EKG None  Radiology No results found.  Procedures Procedures (including critical care time)  Medications Ordered in ED Medications  acetaminophen (TYLENOL) tablet 650 mg (650 mg Oral Given 10/18/17 0758)     Initial Impression / Assessment and Plan / ED Course  I have reviewed the triage vital signs and the nursing notes.  Pertinent labs & imaging results that were available during my care of the patient were reviewed by me and considered in my medical decision making (see chart for details).     Pt given food, feet soaked, pt given clean paper scrubs.  Pt inappropriate with staff.    Final Clinical Impressions(s) / ED Diagnoses   Final diagnoses:  Foot pain, bilateral    ED Discharge Orders    None    An After Visit Summary was printed and given to the patient.    Elson Areas, [place] 10/18/17 1347    Donnetta Hutching, MD 10/18/17 1535

## 2017-11-11 ENCOUNTER — Other Ambulatory Visit: Payer: Self-pay

## 2017-11-11 ENCOUNTER — Encounter (HOSPITAL_COMMUNITY): Payer: Self-pay | Admitting: Emergency Medicine

## 2017-11-11 ENCOUNTER — Emergency Department (HOSPITAL_COMMUNITY)
Admission: EM | Admit: 2017-11-11 | Discharge: 2017-11-11 | Disposition: A | Discharge status: Home / Self Care | Payer: Medicare Other

## 2017-11-11 ENCOUNTER — Emergency Department (HOSPITAL_COMMUNITY)
Admission: EM | Admit: 2017-11-11 | Discharge: 2017-11-11 | Disposition: A | Discharge status: Home / Self Care | Payer: Self-pay | Attending: Emergency Medicine | Admitting: Emergency Medicine

## 2017-11-11 DIAGNOSIS — M79605 Pain in left leg: Secondary | ICD-10-CM | POA: Insufficient documentation

## 2017-11-11 DIAGNOSIS — M79604 Pain in right leg: Secondary | ICD-10-CM | POA: Insufficient documentation

## 2017-11-11 DIAGNOSIS — Z5321 Procedure and treatment not carried out due to patient leaving prior to being seen by health care provider: Secondary | ICD-10-CM | POA: Insufficient documentation

## 2017-11-11 NOTE — ED Triage Notes (Signed)
Per [facility], [place] called [place]-patient states his feet hurt because "he walks a lot looking for crack"-last use yesterday

## 2017-11-11 NOTE — ED Triage Notes (Signed)
Pt brought in by EMS after pt was found at [employer] on  [employer] were on scene   Pt had an episode of incontinence of urine and states it is from his prostate problem and he was c/o bilateral foot and leg pain  [employer] called EMS to transport him to the hospital since he was complaining of a medical issues

## 2017-11-18 ENCOUNTER — Encounter (HOSPITAL_COMMUNITY): Payer: Self-pay

## 2017-11-18 ENCOUNTER — Emergency Department (HOSPITAL_COMMUNITY)
Admission: EM | Admit: 2017-11-18 | Discharge: 2017-11-18 | Disposition: A | Discharge status: Home / Self Care | Payer: Self-pay | Attending: Physician Assistant | Admitting: Physician Assistant

## 2017-11-18 ENCOUNTER — Other Ambulatory Visit: Payer: Self-pay

## 2017-11-18 DIAGNOSIS — Z79899 Other long term (current) drug therapy: Secondary | ICD-10-CM | POA: Insufficient documentation

## 2017-11-18 DIAGNOSIS — I509 Heart failure, unspecified: Secondary | ICD-10-CM | POA: Insufficient documentation

## 2017-11-18 DIAGNOSIS — M79672 Pain in left foot: Secondary | ICD-10-CM | POA: Insufficient documentation

## 2017-11-18 DIAGNOSIS — F17210 Nicotine dependence, cigarettes, uncomplicated: Secondary | ICD-10-CM | POA: Insufficient documentation

## 2017-11-18 DIAGNOSIS — M79671 Pain in right foot: Secondary | ICD-10-CM | POA: Insufficient documentation

## 2017-11-18 DIAGNOSIS — I110 Hypertensive heart disease with heart failure: Secondary | ICD-10-CM | POA: Insufficient documentation

## 2017-11-18 DIAGNOSIS — E119 Type 2 diabetes mellitus without complications: Secondary | ICD-10-CM | POA: Insufficient documentation

## 2017-11-18 MED ORDER — ACETAMINOPHEN 500 MG PO TABS
500.0000 mg | ORAL_TABLET | Freq: Once | ORAL | Status: AC
  Administered 2017-11-18: 500 mg via ORAL
  Filled 2017-11-18: qty 1

## 2017-11-18 MED ORDER — ACETAMINOPHEN 500 MG PO TABS: 500.0000 mg | ORAL_TABLET | Freq: Four times a day (QID) | ORAL | 0 refills | Status: DC | PRN

## 2017-11-18 NOTE — ED Provider Notes (Signed)
[facility] EMERGENCY DEPARTMENT Provider Note   CSN: [id number] Arrival date & time: 11/18/17  1316     History   Chief Complaint No chief complaint on file.   HPI [name] is a 57 y.o. male.  HPI 57 year old male past medical history significant for hypertension, diabetes, substance abuse, schizophrenia, chronic foot pain presents to the ED for evaluation of bilateral foot pain.  He states like his feet are swollen.  He is asking for Tylenol for pain.  Patient denies any injuries to his feet.  He does have a history of diabetes.  Patient is able to ambulate.  He states that he has been walking a lot.  Patient states he was recently discharged from prison and has no shoes.  Patient is asking for a Child psychotherapist consult, Tylenol, soaking his feet and Epson salt and food.  Of note patient has been seen several times in the ED for similar symptoms.  He has been seen here 22 times in the past 6 months.  Patient does report polysubstance abuse.  He denies any wounds to his feet.  Denies any associated fevers or chills.  Denies any chest pain or shortness of breath.  Denies any swelling in his legs.  Patient does have history of hypertension does not take his medications.  Patient is asking for place to stay and a bus pass.  Denies any SI or HI. Past Medical History:  Diagnosis Date  . CHF (congestive heart failure) (HCC)   . Chronic foot pain   . Cocaine abuse (HCC)   . Diabetes mellitus without complication (HCC)   . Hepatitis C   . Homelessness   . Homelessness   . Hypertension   . Neuropathy   . Polysubstance abuse (HCC)   . Schizophrenia [facility])     Patient Active Problem List   Diagnosis Date Noted  . Cocaine abuse with cocaine-induced mood disorder (HCC) 09/18/2017  . Chronic foot pain   . Schizoaffective disorder, bipolar type (HCC) 09/30/2016  . Suicidal thoughts   . Substance induced mood disorder (HCC) 03/13/2015  . Acute kidney failure (HCC)  01/26/2015  . Schizophrenia, paranoid type (HCC) 01/17/2015  . Suicidal ideation   . Drug hallucinosis (HCC) 10/08/2014  . Chronic paranoid schizophrenia (HCC) 09/07/2014  . 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 Acute CHF (congestive heart failure) (HCC) 07/29/2014  . Cocaine use disorder, severe, dependence (HCC)   . Cocaine abuse (HCC) 03/10/2014  . Essential hypertension, benign 03/28/2013  . Diabetes mellitus (HCC) 03/15/2013    History reviewed. No pertinent surgical history.      Home Medications    Prior to Admission medications   Medication Sig Start Date End Date Taking? Authorizing Provider  acetaminophen (TYLENOL) 500 MG tablet Take 1 tablet (500 mg total) by mouth every 6 (six) hours as needed. 11/18/17   Rise Mu, PA-C  ARIPiprazole (ABILIFY) 5 MG tablet Take 5 mg by mouth daily.    [provider]  benztropine (COGENTIN) 1 MG tablet Take 1 mg by mouth daily.    [provider]  carbamazepine (TEGRETOL) 100 MG chewable tablet Chew 100 mg by mouth daily.    [provider]  Cetirizine HCl (ZYRTEC ALLERGY) 10 MG CAPS Take 1 capsule (10 mg total) by mouth daily. 10/03/17   Ward, Layla Maw, DO  fluticasone (FLONASE) 50 MCG/ACT nasal spray Place 2 sprays into both nostrils daily. 10/03/17  Ward, Layla Maw, DO  hydrochlorothiazide (HYDRODIURIL) 25 MG tablet Take 1 tablet (25 mg total) by mouth daily. 10/13/17   Ward, Layla Maw, DO  hydrOXYzine (ATARAX/VISTARIL) 50 MG tablet Take 50 mg by mouth 2 (two) times daily.    [provider]  lisinopril (PRINIVIL,ZESTRIL) 40 MG tablet Take 1 tablet (40 mg total) by mouth daily. 10/13/17   Ward, Layla Maw, DO    Family History Family History  Problem Relation Age of Onset  . Hypertension Other   . Diabetes Other     Social History Social History   Tobacco Use  . Smoking status: Current Every Day Smoker     Packs/day: 1.00    Years: 20.00    Pack years: 20.00    Types: Cigarettes  . Smokeless tobacco: Current User  Substance Use Topics  . Alcohol use: Yes    Comment: Daily Drinker   . Drug use: Yes    Frequency: 7.0 times per week    Types: "Crack" cocaine, Cocaine, Marijuana    Comment: Cocaine tonight, Marijuana "a long time"     Allergies   Haldol [haloperidol]   Review of Systems Review of Systems  All other systems reviewed and are negative.    Physical Exam Updated Vital Signs BP (!) 167/99 (BP Location: Right Arm)   Pulse 90   Temp 99.1 F (37.3 C) (Oral)   Resp 18   SpO2 99%   Physical Exam  Constitutional: He is oriented to person, place, and time. He appears well-developed and well-nourished. No distress.  HENT:  Head: Normocephalic and atraumatic.  Eyes: Right eye exhibits no discharge. Left eye exhibits no discharge. No scleral icterus.  Neck: Normal range of motion.  Pulmonary/Chest: Effort normal and breath sounds normal. No stridor. No respiratory distress. He has no wheezes. He has no rales. He exhibits no tenderness.  Musculoskeletal: Normal range of motion.  Normal ROM in all joints; non-tender to palpation; no edema; normal capillary refill; no cyanosis, no calf tenderness or swelling, no joint effusion, no redness or warmth, 2+ DP pulses bilaterally, no lesions on his legs noted   patient does have some swelling of the feet.  There is no lesions or ulcers noted.  There is no erythema or warmth.  Neurological: He is alert and oriented to person, place, and time.  Skin: Skin is warm and dry. Capillary refill takes less than 2 seconds. No pallor.  Psychiatric: His behavior is normal. Judgment and thought content normal.  Nursing note and vitals reviewed.    ED Treatments / Results  Labs (all labs ordered are listed, but only abnormal results are displayed) Labs Reviewed - No data to display  EKG None  Radiology No results  found.  Procedures Procedures (including critical care time)  Medications Ordered in ED Medications  acetaminophen (TYLENOL) tablet 500 mg (500 mg Oral Given 11/18/17 1643)     Initial Impression / Assessment and Plan / ED Course  I have reviewed the triage vital signs and the nursing notes.  Pertinent labs & imaging results that were available during my care of the patient were reviewed by me and considered in my medical decision making (see chart for details).     Patient presents to the ED for evaluation of chronic foot pain.  He is also asking for social worker consult, Tylenol, something to eat, some place to stay, new shoes and a Tylenol prescription.  Patient has a history of homelessness and bilateral chronic foot pain.  Patient has no signs of septic arthritis, gout, DVT, fracture.  He does have some mild swelling of the feet that seems to be chronic in nature.  There is no lower extremity edema.  There are no concerning ulcers.  No signs of cellulitis.  Patient's blood pressure is elevated with history of same.  Patient did not take his blood pressure medicine today.  Denies any associated chest pain or shortness of breath.  Do not feel patient needs any further work-up.  Lungs clear to auscultation bilaterally.  Doubt CHF exacerbation.  Patient requested Tylenol and something to soak his feet and.  We will get social worker consult.  We will give prescription for Tylenol.  Patient will be given a bus pass for a shelter this evening.  Pt is hemodynamically stable, in NAD, & able to ambulate in the ED. Evaluation does not show pathology that would require ongoing emergent intervention or inpatient treatment. I explained the diagnosis to the patient. Pain has been managed & has no complaints prior to dc. Pt is comfortable with above plan and is stable for discharge at this time. All questions were answered prior to disposition. Strict return precautions for f/u to the ED were discussed.  Encouraged follow up with PCP.    Final Clinical Impressions(s) / ED Diagnoses   Final diagnoses:  Bilateral foot pain    ED Discharge Orders        Ordered    acetaminophen (TYLENOL) 500 MG tablet  Every 6 hours PRN     11/18/17 1636       Rise Mu, PA-C 11/18/17 1700    Mackuen, Cindee Salt, MD 11/19/17 1559

## 2017-11-18 NOTE — ED Triage Notes (Signed)
[facility]- pt picked up by EMS sleeping on side of the road. Pt has bilateral foot pain. Pt alert and oriented on arrival. No distress noted.   150/86 94 CBG 141

## 2017-11-18 NOTE — ED Notes (Signed)
Case management at bedside. Patient given graham crackers and peanut butter.

## 2017-11-18 NOTE — Discharge Instructions (Addendum)
Please follow-up with your primary care doctor and possible podiatry.  Take Tylenol for any pain.  Return to ED with any worsening symptoms including fever or redness.

## 2017-11-18 NOTE — ED Notes (Signed)
Patient verbalized understanding of discharge instructions and denies any further needs or questions at this time. VS stable. Patient ambulatory with steady gait. Clothes provided by case management placed in bag and with patient - escorted to ED entrance in wheelchair.

## 2017-11-18 NOTE — Progress Notes (Signed)
CSW consulted due to homelessness. Pt reported that he was recently discharged from jail and does not have anywhere to stay. Pt reported that he was not discharged with any clothes. Social work provided pt with shoes, socks, pants, 2 shirts, and a jacket.   CSW discussed with pt about going to [facility] where there is open intakes from 2 to 8 pm. Pt agreed to go to [facility], understanding that there might not be room. [facility] in [place] does not have any available beds and does not have open intakes. CSW provided pt with a bus ticket and a part ticket to get to [facility] in [place].   [name], [name]  [phone]

## 2017-11-23 ENCOUNTER — Emergency Department (HOSPITAL_COMMUNITY)
Admission: EM | Admit: 2017-11-23 | Discharge: 2017-11-23 | Disposition: A | Discharge status: Home / Self Care | Payer: Self-pay | Attending: Emergency Medicine | Admitting: Emergency Medicine

## 2017-11-23 ENCOUNTER — Other Ambulatory Visit: Payer: Self-pay

## 2017-11-23 ENCOUNTER — Encounter (HOSPITAL_COMMUNITY): Payer: Self-pay | Admitting: Emergency Medicine

## 2017-11-23 DIAGNOSIS — F17210 Nicotine dependence, cigarettes, uncomplicated: Secondary | ICD-10-CM | POA: Insufficient documentation

## 2017-11-23 DIAGNOSIS — Z5900 Homelessness unspecified: Secondary | ICD-10-CM

## 2017-11-23 DIAGNOSIS — M545 Low back pain, unspecified: Secondary | ICD-10-CM

## 2017-11-23 DIAGNOSIS — F1410 Cocaine abuse, uncomplicated: Secondary | ICD-10-CM | POA: Insufficient documentation

## 2017-11-23 DIAGNOSIS — E119 Type 2 diabetes mellitus without complications: Secondary | ICD-10-CM | POA: Insufficient documentation

## 2017-11-23 DIAGNOSIS — I10 Essential (primary) hypertension: Secondary | ICD-10-CM | POA: Insufficient documentation

## 2017-11-23 DIAGNOSIS — I509 Heart failure, unspecified: Secondary | ICD-10-CM | POA: Insufficient documentation

## 2017-11-23 DIAGNOSIS — F209 Schizophrenia, unspecified: Secondary | ICD-10-CM | POA: Insufficient documentation

## 2017-11-23 DIAGNOSIS — Z79899 Other long term (current) drug therapy: Secondary | ICD-10-CM | POA: Insufficient documentation

## 2017-11-23 LAB — COMPREHENSIVE METABOLIC PANEL
ALK PHOS: 73 U/L (ref 38–126)
ALT: 10 U/L — AB (ref 17–63)
ANION GAP: 9 (ref 5–15)
AST: 15 U/L (ref 15–41)
Albumin: 3.4 g/dL — ABNORMAL LOW (ref 3.5–5.0)
BILIRUBIN TOTAL: 0.6 mg/dL (ref 0.3–1.2)
BUN: 13 mg/dL (ref 6–20)
CALCIUM: 8.9 mg/dL (ref 8.9–10.3)
CO2: 23 mmol/L (ref 22–32)
CREATININE: 0.82 mg/dL (ref 0.61–1.24)
Chloride: 107 mmol/L (ref 101–111)
GFR calc non Af Amer: >60 mL/min (ref >60)
Glucose, Bld: 135 mg/dL — ABNORMAL HIGH (ref 65–99)
Potassium: 3.8 mmol/L (ref 3.5–5.1)
Sodium: 139 mmol/L (ref 135–145)
TOTAL PROTEIN: 6.8 g/dL (ref 6.5–8.1)

## 2017-11-23 LAB — CBC
HEMATOCRIT: 34.4 % — AB (ref 39.0–52.0)
Hemoglobin: 10.6 g/dL — ABNORMAL LOW (ref 13.0–17.0)
MCH: 28.8 pg (ref 26.0–34.0)
MCHC: 30.8 g/dL (ref 30.0–36.0)
MCV: 93.5 fL (ref 78.0–100.0)
Platelets: 217 10*3/uL (ref 150–400)
RBC: 3.68 MIL/uL — ABNORMAL LOW (ref 4.22–5.81)
RDW: 14.3 % (ref 11.5–15.5)
WBC: 7.2 10*3/uL (ref 4.0–10.5)

## 2017-11-23 LAB — SALICYLATE LEVEL

## 2017-11-23 LAB — RAPID URINE DRUG SCREEN, HOSP PERFORMED
Amphetamines: NOT DETECTED
BARBITURATES: NOT DETECTED
Benzodiazepines: NOT DETECTED
Cocaine: POSITIVE — AB
Opiates: NOT DETECTED
Tetrahydrocannabinol: NOT DETECTED

## 2017-11-23 LAB — ETHANOL: Alcohol, Ethyl (B): <10 mg/dL (ref <10)

## 2017-11-23 LAB — ACETAMINOPHEN LEVEL: Acetaminophen (Tylenol), Serum: <10 ug/mL — ABNORMAL LOW (ref 10–30)

## 2017-11-23 MED ORDER — HYDROCHLOROTHIAZIDE 25 MG PO TABS
25.0000 mg | ORAL_TABLET | Freq: Every day | ORAL | Status: DC
  Filled 2017-11-23: qty 1

## 2017-11-23 MED ORDER — LISINOPRIL 20 MG PO TABS
40.0000 mg | ORAL_TABLET | Freq: Every day | ORAL | Status: DC
  Filled 2017-11-23: qty 2

## 2017-11-23 MED ORDER — ACETAMINOPHEN 500 MG PO TABS: 500.0000 mg | ORAL_TABLET | Freq: Three times a day (TID) | ORAL | Status: DC | PRN

## 2017-11-23 MED ORDER — ARIPIPRAZOLE 5 MG PO TABS: 5.0000 mg | ORAL_TABLET | Freq: Every day | ORAL | Status: DC

## 2017-11-23 MED ORDER — HYDROXYZINE HCL 25 MG PO TABS: 50.0000 mg | ORAL_TABLET | Freq: Two times a day (BID) | ORAL | Status: DC

## 2017-11-23 MED ORDER — CARBAMAZEPINE 100 MG PO CHEW
100.0000 mg | CHEWABLE_TABLET | Freq: Every day | ORAL | Status: DC
  Filled 2017-11-23: qty 1

## 2017-11-23 MED ORDER — ACETAMINOPHEN 325 MG PO TABS
650.0000 mg | ORAL_TABLET | Freq: Once | ORAL | Status: AC
  Administered 2017-11-23: 650 mg via ORAL
  Filled 2017-11-23: qty 2

## 2017-11-23 MED ORDER — BENZTROPINE MESYLATE 1 MG PO TABS: 1.0000 mg | ORAL_TABLET | Freq: Every day | ORAL | Status: DC

## 2017-11-23 NOTE — ED Triage Notes (Signed)
Pt presents by EMS for evaluation of back pain and suicidal ideation that has been ongoing for 2 months. Pt reports plan of either overdosing or jumping off of a bridge.

## 2017-11-23 NOTE — ED Provider Notes (Signed)
[facility]-EMERGENCY DEPT Provider Note   CSN: [id number] Arrival date & time: 11/23/17  0350     History   Chief Complaint Chief Complaint  Patient presents with  . Suicidal    HPI [name] is a 57 y.o. male.  The history is provided by the patient and medical records. No language interpreter was used.   [name] is a 57 y.o. male  with a PMH of homelessness, schizophrenia, substance abuse, HTN, DM, CHF who presents to the Emergency Department complaining of "mental problems".  He states this been ongoing issue for several weeks now.  He states that people are "putting thoughts in my mind".  He reports "needing some rest for a few days".  He denies any suicidal thoughts currently. When asked about this, he states "yeah I did, but no I don't feel like that any more".    Patient additionally complaining of intermittent, right-sided low back pain for the last 2 months.  Typically improves with Tylenol, but he has not been able to afford this, therefore tried prior to arrival. Patient denies upper back or neck pain. No fever, saddle anesthesia, weakness, numbness, urinary complaints including retention/incontinence. No history of cancer, IVDU, or recent spinal procedures.  Past Medical History:  Diagnosis Date  . CHF (congestive heart failure) (HCC)   . Chronic foot pain   . Cocaine abuse (HCC)   . Diabetes mellitus without complication (HCC)   . Hepatitis C   . Homelessness   . Homelessness   . Hypertension   . Neuropathy   . Polysubstance abuse (HCC)   . Schizophrenia [facility])     Patient Active Problem List   Diagnosis Date Noted  . Cocaine abuse with cocaine-induced mood disorder (HCC) 09/18/2017  . Chronic foot pain   . Schizoaffective disorder, bipolar type (HCC) 09/30/2016  . Suicidal thoughts   . Substance induced mood disorder (HCC) 03/13/2015  . Acute kidney failure (HCC) 01/26/2015  . Schizophrenia, paranoid type (HCC)  01/17/2015  . Suicidal ideation   . Drug hallucinosis (HCC) 10/08/2014  . Chronic paranoid schizophrenia (HCC) 09/07/2014  . 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 Acute CHF (congestive heart failure) (HCC) 07/29/2014  . Cocaine use disorder, severe, dependence (HCC)   . Cocaine abuse (HCC) 03/10/2014  . Essential hypertension, benign 03/28/2013  . Diabetes mellitus (HCC) 03/15/2013    History reviewed. No pertinent surgical history.      Home Medications    Prior to Admission medications   Medication Sig Start Date End Date Taking? Authorizing Provider  ARIPiprazole (ABILIFY) 5 MG tablet Take 5 mg by mouth daily.   Yes [provider]  benztropine (COGENTIN) 1 MG tablet Take 1 mg by mouth daily.   Yes [provider]  carbamazepine (TEGRETOL) 100 MG chewable tablet Chew 100 mg by mouth daily.   Yes [provider]  hydrOXYzine (ATARAX/VISTARIL) 50 MG tablet Take 50 mg by mouth 2 (two) times daily.   Yes [provider]  acetaminophen (TYLENOL) 500 MG tablet Take 1 tablet (500 mg total) by mouth every 6 (six) hours as needed. Patient not taking: Reported on 11/23/2017 11/18/17   Demetrios Loll T, PA-C  Cetirizine HCl (ZYRTEC ALLERGY) 10 MG CAPS Take 1 capsule (10 mg total) by mouth daily. Patient not taking: Reported on 11/23/2017 10/03/17   Ward, Layla Maw, DO  fluticasone (FLONASE) 50 MCG/ACT nasal spray Place 2 sprays into both  nostrils daily. Patient not taking: Reported on 11/23/2017 10/03/17   Ward, Layla Maw, DO  hydrochlorothiazide (HYDRODIURIL) 25 MG tablet Take 1 tablet (25 mg total) by mouth daily. Patient not taking: Reported on 11/23/2017 10/13/17   Ward, Layla Maw, DO  lisinopril (PRINIVIL,ZESTRIL) 40 MG tablet Take 1 tablet (40 mg total) by mouth daily. Patient not taking: Reported on 11/23/2017 10/13/17   Ward, Layla Maw, DO    Family History Family History    Problem Relation Age of Onset  . Hypertension Other   . Diabetes Other     Social History Social History   Tobacco Use  . Smoking status: Current Every Day Smoker    Packs/day: 1.00    Years: 20.00    Pack years: 20.00    Types: Cigarettes  . Smokeless tobacco: Current User  Substance Use Topics  . Alcohol use: Yes    Comment: Daily Drinker   . Drug use: Yes    Frequency: 7.0 times per week    Types: "Crack" cocaine, Cocaine, Marijuana    Comment: Cocaine tonight, Marijuana "a long time"     Allergies   Haldol [haloperidol]   Review of Systems Review of Systems  Musculoskeletal: Positive for back pain.  Psychiatric/Behavioral: Negative for suicidal ideas.  All other systems reviewed and are negative.    Physical Exam Updated Vital Signs BP (!) 163/96 (BP Location: Right Arm)   Pulse 93   Temp 98.1 F (36.7 C) (Oral)   Resp 16   SpO2 98%   Physical Exam  Constitutional: He is oriented to person, place, and time. He appears well-developed and well-nourished.  Neck:  No midline or paraspinal tenderness. Full ROM without pain.  Cardiovascular: Normal rate, regular rhythm, normal heart sounds and intact distal pulses.  Pulmonary/Chest: Effort normal and breath sounds normal. No respiratory distress.  Abdominal: Soft. Bowel sounds are normal. He exhibits no distension. There is no tenderness.  Musculoskeletal:  No midline T/L tenderness. Tenderness to palpation of right lumbar paraspinal musculature. 5/5 muscle strength in bilateral LE's. Straight leg raises are negative bilaterally for radicular symptoms. Able to ambulate independently with steady gait.  Neurological: He is alert and oriented to person, place, and time. He has normal reflexes.  Bilateral lower extremities neurovascularly intact.  Skin: Skin is warm and dry. No rash noted. No erythema.  Nursing note and vitals reviewed.    ED Treatments / Results  Labs (all labs ordered are listed, but only  abnormal results are displayed) Labs Reviewed  COMPREHENSIVE METABOLIC PANEL - Abnormal; Notable for the following components:      Result Value   Glucose, Bld 135 (*)    Albumin 3.4 (*)    ALT 10 (*)    All other components within normal limits  ACETAMINOPHEN LEVEL - Abnormal; Notable for the following components:   Acetaminophen (Tylenol), Serum <10 (*)    All other components within normal limits  CBC - Abnormal; Notable for the following components:   RBC 3.68 (*)    Hemoglobin 10.6 (*)    HCT 34.4 (*)    All other components within normal limits  RAPID URINE DRUG SCREEN, HOSP PERFORMED - Abnormal; Notable for the following components:   Cocaine POSITIVE (*)    All other components within normal limits  ETHANOL  SALICYLATE LEVEL    EKG None  Radiology No results found.  Procedures Procedures (including critical care time)  Medications Ordered in ED Medications  acetaminophen (TYLENOL) tablet 650 mg (has  no administration in time range)  lisinopril (PRINIVIL,ZESTRIL) tablet 40 mg (has no administration in time range)  hydrochlorothiazide (HYDRODIURIL) tablet 25 mg (has no administration in time range)  acetaminophen (TYLENOL) tablet 500 mg (has no administration in time range)  ARIPiprazole (ABILIFY) tablet 5 mg (has no administration in time range)  benztropine (COGENTIN) tablet 1 mg (has no administration in time range)  carbamazepine (TEGRETOL) chewable tablet 100 mg (has no administration in time range)  hydrOXYzine (ATARAX/VISTARIL) tablet 50 mg (has no administration in time range)     Initial Impression / Assessment and Plan / ED Course  I have reviewed the triage vital signs and the nursing notes.  Pertinent labs & imaging results that were available during my care of the patient were reviewed by me and considered in my medical decision making (see chart for details).    [name] is a 57 y.o. male who presents to ED for 2 complaints:  1.   Right-sided back pain for about 2 months. Patient demonstrates no lower extremity weakness, saddle anesthesia, bowel or bladder incontinence or neuro deficits. No concern for cauda equina. No fevers or other infectious symptoms to suggest that the patient's back pain is due to an infection. Lower extremities are neurovascularly intact and patient is ambulating without difficulty.  Tylenol given in ED.  Recommended PCP follow-up.   2. "  Mental problems" with history of schizophrenia.  He reports people "putting thoughts in my mind".  Initially reported passive suicidal thoughts to triage. He reports that he no longer feels suicidal during my evaluation. He also reports needing some rest for a few days and that he gets paid in a couple of days as well. ? Malingering behavior due to homelessness and lack of resources.   Labs reviewed. Patient is medically cleared. Will consult TTS for evaluation of #2. Disposition per TTS recommendations.    Final Clinical Impressions(s) / ED Diagnoses   Final diagnoses:  Right-sided low back pain without sciatica, unspecified chronicity  Homelessness    ED Discharge Orders    None       Ward, Chase Picket, PA-C 11/23/17 1610    Dione Booze, MD 11/23/17 2253

## 2017-11-23 NOTE — ED Notes (Signed)
Pt states that he is ready to be discharged.  Made TTS aware.  Pt is clear for discharge.

## 2017-11-23 NOTE — ED Notes (Signed)
Bed: WTR5 Expected date:  Expected time:  Means of arrival:  Comments: 

## 2017-11-23 NOTE — BH Assessment (Addendum)
Assessment Note  [name] is an 57 y.o. male. Patient presents voluntarily to [facility] brought in by EMS with complaint of back pain. He also had endorsed suicidal ideation upon admission. Pt is asleep sitting up in chair and writer wakes him up for assessment. Pt is cooperative and oriented to person, place, date and situation. He falls asleep several more times during assessment but is easy to wake up. He currently denies suicidal ideation. He denies a history of suicide attempts. Pt reports he uses crack cocaine and alcohol daily. (See below for substance use details). He reports his mood is "clear". He endorses short term memory impairment. Pt reports irritability, hopelessness and isolating. He is homeless and living on the street. Pt says he presents to ED "for direction for what I want to do." Pt has presented to [facility] EDs 23 times in past 6 months. He replies that he "is fragile" when asked why he has not be able to go to [facility] for outpatient mental health treatment. He reports chronic auditory hallucinations. He does not give any details regarding AH. No delusions noted. Pt denies homicidal thoughts or physical aggression. Pt denies having access to firearms. Pt denies having any legal problems at this time. He reports he blacks out frequently. He reports history of mental illness on his dad's side of family. He reports no social support system.   Diagnosis: Schizophrenia Cocaine Use Disorder, Severe Alcohol Use Disorder, Mild  Past Medical History:  Past Medical History:  Diagnosis Date  . CHF (congestive heart failure) (HCC)   . Chronic foot pain   . Cocaine abuse (HCC)   . Diabetes mellitus without complication (HCC)   . Hepatitis C   . Homelessness   . Homelessness   . Hypertension   . Neuropathy   . Polysubstance abuse (HCC)   . Schizophrenia (HCC)     History reviewed. No pertinent surgical history.  Family History:  Family History  Problem Relation Age of Onset   . Hypertension Other   . Diabetes Other     Social History:  reports that he has been smoking cigarettes.  He has a 20.00 pack-year smoking history. He uses smokeless tobacco. He reports that he drinks alcohol. He reports that he has current or past drug history. Drugs: "Crack" cocaine, Cocaine, and Marijuana. Frequency: 7.00 times per week.  Additional Social History:  Alcohol / Drug Use Pain Medications: pt denies abuse - see pta meds list Prescriptions: pt denies abuse - see pta meds list Over the Counter: pt denies abuse- see pta meds list History of alcohol / drug use?: Yes Substance #1 Name of Substance 1: cocaine 1 - Age of First Use: unknown 1 - Amount (size/oz): 1 gram 1 - Frequency: daily 1 - Duration: months 1 - Last Use / Amount: 11/22/17 Substance #2 Name of Substance 2: etoh 2 - Age of First Use: 15 2 - Amount (size/oz): two 12 oz beers 2 - Frequency: daily 2 - Duration: months 2 - Last Use / Amount: 11/22/17 Substance #3 Name of Substance 3: marijuana 3 - Age of First Use: unknown 3 - Amount (size/oz): varies 3 - Frequency: rarely 3 - Last Use / Amount: can't remember  CIWA: CIWA-Ar BP: (!) 163/96 Pulse Rate: 93 COWS:    Allergies:  Allergies  Allergen Reactions  . Haldol [Haloperidol] Other (See Comments)    Muscle spasms, loss of voluntary movement. However, pt has taken Thorazine on multiple occasions with no adverse effects.  Home Medications:  (Not in a hospital admission)  OB/GYN Status:  No LMP for male patient.  General Assessment Data Assessment unable to be completed: Yes Reason for not completing assessment: (consult at 6:12 am, TTS involved in another assessment) Location of Assessment: [facility] ED TTS Assessment: In system Is this a Tele or Face-to-Face Assessment?: Face-to-Face Is this an Initial Assessment or a Re-assessment for this encounter?: Initial Assessment Marital status: Single Maiden name: none Is patient pregnant?:  No Pregnancy Status: No Living Arrangements: Other (Comment)(homeless) Can pt return to current living arrangement?: Yes Admission Status: Voluntary Is patient capable of signing voluntary admission?: Yes Referral Source: Self/Family/Friend Insurance type: medicare     Crisis Care Plan Living Arrangements: Other (Comment)(homeless) Legal Guardian: (himself) Name of Psychiatrist: none Name of Therapist: none  Education Status Is patient currently in school?: No Is the patient employed, unemployed or receiving disability?: Unemployed  Risk to self with the past 6 months Suicidal Ideation: No Has patient been a risk to self within the past 6 months prior to admission? : No Suicidal Intent: No Has patient had any suicidal intent within the past 6 months prior to admission? : No Is patient at risk for suicide?: No Suicidal Plan?: No Has patient had any suicidal plan within the past 6 months prior to admission? : No Access to Means: (none) What has been your use of drugs/alcohol within the last 12 months?: daily crack and etoh use Previous Attempts/Gestures: No How many times?: 0 Other Self Harm Risks: none Triggers for Past Attempts: (n/a) Intentional Self Injurious Behavior: None Family Suicide History: No Recent stressful life event(s): Other (Comment)(homelessness) Persecutory voices/beliefs?: No Depression: Yes Depression Symptoms: Despondent, Isolating, Feeling angry/irritable Substance abuse history and/or treatment for substance abuse?: Yes Suicide prevention information given to non-admitted patients: Not applicable  Risk to Others within the past 6 months Homicidal Ideation: No Does patient have any lifetime risk of violence toward others beyond the six months prior to admission? : No Thoughts of Harm to Others: No Current Homicidal Intent: No Current Homicidal Plan: No Access to Homicidal Means: No Identified Victim: none History of harm to others?:  No Assessment of Violence: None Noted Violent Behavior Description: pt denies history of violence Does patient have access to weapons?: No Criminal Charges Pending?: No Does patient have a court date: No Is patient on probation?: No  Psychosis Hallucinations: Auditory Delusions: None noted  Mental Status Report Appearance/Hygiene: Disheveled, In scrubs Eye Contact: Fair Motor Activity: Freedom of movement Speech: Soft Level of Consciousness: Sleeping, Drowsy Mood: Other (Comment)("clear") Affect: Appropriate to circumstance Anxiety Level: Minimal Thought Processes: Relevant, Coherent Judgement: Impaired Orientation: Person, Place, Time, Situation Obsessive Compulsive Thoughts/Behaviors: None  Cognitive Functioning Concentration: Normal Memory: Recent Impaired, Remote Intact Is patient IDD: No Is patient DD?: No Insight: Poor Impulse Control: Poor Appetite: Fair Have you had any weight changes? : No Change Sleep: No Change Total Hours of Sleep: 5 Vegetative Symptoms: None  ADLScreening [facility] Assessment Services) Patient's cognitive ability adequate to safely complete daily activities?: Yes Patient able to express need for assistance with ADLs?: Yes Independently performs ADLs?: Yes (appropriate for developmental age)  Prior Inpatient Therapy Prior Inpatient Therapy: Yes Prior Therapy Dates: unknown Prior Therapy Facilty/Provider(s): can't remember Reason for Treatment: substance abuse  Prior Outpatient Therapy Prior Outpatient Therapy: No Does patient have an ACCT team?: No Does patient have Intensive In-House Services?  : No Does patient have [name] services? : No Does patient have P4CC services?: No  ADL  Screening (condition at time of admission) Patient's cognitive ability adequate to safely complete daily activities?: Yes Is the patient deaf or have difficulty hearing?: No Does the patient have difficulty seeing, even when wearing glasses/contacts?:  No Does the patient have difficulty concentrating, remembering, or making decisions?: Yes Patient able to express need for assistance with ADLs?: Yes Does the patient have difficulty dressing or bathing?: No Independently performs ADLs?: Yes (appropriate for developmental age) Does the patient have difficulty walking or climbing stairs?: Yes Weakness of Legs: None Weakness of Arms/Hands: None  Home Assistive Devices/Equipment Home Assistive Devices/Equipment: None    Abuse/Neglect Assessment (Assessment to be complete while patient is alone) Abuse/Neglect Assessment Can Be Completed: Yes Physical Abuse: Denies Verbal Abuse: Denies Sexual Abuse: Denies Exploitation of patient/patient's resources: Denies Self-Neglect: Denies     Merchant navy officer (For Healthcare) Does Patient Have a Medical Advance Directive?: No Would patient like information on creating a medical advance directive?: No - Patient declined    Additional Information 1:1 In Past 12 Months?: No CIRT Risk: No Elopement Risk: No Does patient have medical clearance?: Yes     Disposition:  Disposition Initial Assessment Completed for this Encounter: Yes Disposition of Patient: Discharge(laurie parks NP recommends discharge)   Elta Guadeloupe NP recommends patient be discharged. Patient is aware of all outpatient substance abuse and mental illness agencies in the area.   On Site Evaluation by:   Reviewed with Physician:    Donnamarie Rossetti P 11/23/2017 8:55 AM

## 2017-12-07 ENCOUNTER — Encounter (HOSPITAL_COMMUNITY): Payer: Self-pay | Admitting: Emergency Medicine

## 2017-12-07 ENCOUNTER — Emergency Department (HOSPITAL_COMMUNITY)
Admission: EM | Admit: 2017-12-07 | Discharge: 2017-12-07 | Disposition: A | Discharge status: Home / Self Care | Payer: Medicaid Other | Attending: Emergency Medicine | Admitting: Emergency Medicine

## 2017-12-07 DIAGNOSIS — I110 Hypertensive heart disease with heart failure: Secondary | ICD-10-CM | POA: Insufficient documentation

## 2017-12-07 DIAGNOSIS — F17210 Nicotine dependence, cigarettes, uncomplicated: Secondary | ICD-10-CM | POA: Insufficient documentation

## 2017-12-07 DIAGNOSIS — E119 Type 2 diabetes mellitus without complications: Secondary | ICD-10-CM | POA: Diagnosis not present

## 2017-12-07 DIAGNOSIS — M79672 Pain in left foot: Secondary | ICD-10-CM | POA: Insufficient documentation

## 2017-12-07 DIAGNOSIS — Z79899 Other long term (current) drug therapy: Secondary | ICD-10-CM | POA: Diagnosis not present

## 2017-12-07 DIAGNOSIS — I509 Heart failure, unspecified: Secondary | ICD-10-CM | POA: Diagnosis not present

## 2017-12-07 DIAGNOSIS — G8929 Other chronic pain: Secondary | ICD-10-CM

## 2017-12-07 DIAGNOSIS — M79671 Pain in right foot: Secondary | ICD-10-CM | POA: Insufficient documentation

## 2017-12-07 MED ORDER — ACETAMINOPHEN 500 MG PO TABS: 1000.0000 mg | ORAL_TABLET | Freq: Once | ORAL | Status: DC

## 2017-12-07 MED ORDER — ACETAMINOPHEN 500 MG PO TABS: 500.0000 mg | ORAL_TABLET | Freq: Four times a day (QID) | ORAL | 0 refills | Status: DC | PRN

## 2017-12-07 NOTE — ED Provider Notes (Signed)
[facility] EMERGENCY DEPARTMENT Provider Note   CSN: [id number] Arrival date & time: 12/07/17  0453     History   Chief Complaint Chief Complaint  Patient presents with  . Foot Pain    HPI [name] is a 57 y.o. male with a h/o of CHF, HTN, DM Type II, schizophrenia, and cocaine abuse who presents to the Emergency Department by EMS with a chief complaint of bilateral foot pain.  He is asking for Tylenol for pain.  He is asking for a sandwich, graham crackers, and something to drink.  He is requesting a social work consult and a pair of shoes.  He reports mild swelling to the bilateral feet.  He denies fever, chills, bilateral ankle pain, pain or swelling to the bilateral lower legs.  He is ambulatory, and has been walking quite a lot.  He has been seen and evaluated in the ED 24 times in the last 6 months, of which many visits have been for similar sometimes.   He denies SI or HI.  He has a history of hypertension, but has not been taking his medications.  The history is provided by the patient. No language interpreter was used.    Past Medical History:  Diagnosis Date  . CHF (congestive heart failure) (HCC)   . Chronic foot pain   . Cocaine abuse (HCC)   . Diabetes mellitus without complication (HCC)   . Hepatitis C   . Homelessness   . Homelessness   . Hypertension   . Neuropathy   . Polysubstance abuse (HCC)   . Schizophrenia [facility])     Patient Active Problem List   Diagnosis Date Noted  . Cocaine abuse with cocaine-induced mood disorder (HCC) 09/18/2017  . Chronic foot pain   . Schizoaffective disorder, bipolar type (HCC) 09/30/2016  . Suicidal thoughts   . Substance induced mood disorder (HCC) 03/13/2015  . Acute kidney failure (HCC) 01/26/2015  . Schizophrenia, paranoid type (HCC) 01/17/2015  . Suicidal ideation   . Drug hallucinosis (HCC) 10/08/2014  . Chronic paranoid schizophrenia (HCC) 09/07/2014  . Substance or  medication-induced bipolar and related disorder with onset during intoxication (HCC) 08/10/2014  . Acute CHF (congestive heart failure) (HCC) 07/29/2014  . Cocaine use disorder, severe, dependence (HCC)   . Cocaine abuse (HCC) 03/10/2014  . Essential hypertension, benign 03/28/2013  . Diabetes mellitus (HCC) 03/15/2013    History reviewed. No pertinent surgical history.      Home Medications    Prior to Admission medications   Medication Sig Start Date End Date Taking? Authorizing Provider  acetaminophen (TYLENOL) 500 MG tablet Take 1 tablet (500 mg total) by mouth every 6 (six) hours as needed. 12/07/17   Lesli Issa A, PA-C  ARIPiprazole (ABILIFY) 5 MG tablet Take 5 mg by mouth daily.    [provider]  benztropine (COGENTIN) 1 MG tablet Take 1 mg by mouth daily.    [provider]  carbamazepine (TEGRETOL) 100 MG chewable tablet Chew 100 mg by mouth daily.    [provider]  Cetirizine HCl (ZYRTEC ALLERGY) 10 MG CAPS Take 1 capsule (10 mg total) by mouth daily. Patient not taking: Reported on 11/23/2017 10/03/17   Ward, Layla Maw, DO  fluticasone (FLONASE) 50 MCG/ACT nasal spray Place 2 sprays into both nostrils daily. Patient not taking: Reported on 11/23/2017 10/03/17   Ward, Layla Maw, DO  hydrochlorothiazide (HYDRODIURIL) 25 MG tablet Take 1 tablet (25 mg total) by mouth daily. Patient  not taking: Reported on 11/23/2017 10/13/17   Ward, Layla MawKristen N, DO  hydrOXYzine (ATARAX/VISTARIL) 50 MG tablet Take 50 mg by mouth 2 (two) times daily.    [provider]  lisinopril (PRINIVIL,ZESTRIL) 40 MG tablet Take 1 tablet (40 mg total) by mouth daily. Patient not taking: Reported on 11/23/2017 10/13/17   Ward, Layla MawKristen N, DO    Family History Family History  Problem Relation Age of Onset  . Hypertension Other   . Diabetes Other     Social History Social History   Tobacco Use  . Smoking status: Current Every Day Smoker    Packs/day: 1.00    Years:  20.00    Pack years: 20.00    Types: Cigarettes  . Smokeless tobacco: Current User  Substance Use Topics  . Alcohol use: Yes    Comment: Daily Drinker   . Drug use: Yes    Frequency: 7.0 times per week    Types: "Crack" cocaine, Cocaine, Marijuana    Comment: Cocaine tonight, Marijuana "a long time"     Allergies   Haldol [haloperidol]   Review of Systems Review of Systems  Constitutional: Negative for activity change, chills and fever.  Respiratory: Negative for shortness of breath.   Cardiovascular: Negative for chest pain.  Gastrointestinal: Negative for abdominal pain.  Musculoskeletal: Positive for arthralgias and myalgias. Negative for back pain, gait problem and joint swelling.  Skin: Negative for color change, rash and wound.  Neurological: Negative for weakness and numbness.     Physical Exam Updated Vital Signs BP (!) 158/112 (BP Location: Right Arm)   Pulse 100   Temp 98.6 F (37 C) (Oral)   Resp 14   Ht 5\' 9"  (1.753 m)   Wt 81.6 kg (180 lb)   SpO2 99%   BMI 26.58 kg/m   Physical Exam  Constitutional: He appears well-developed.  HENT:  Head: Normocephalic.  Eyes: Conjunctivae are normal.  Neck: Neck supple.  Cardiovascular: Normal rate, regular rhythm, normal heart sounds and intact distal pulses. Exam reveals no gallop and no friction rub.  No murmur heard. Pulmonary/Chest: Effort normal. No stridor. No respiratory distress. He has no wheezes. He has no rales. He exhibits no tenderness.  Abdominal: Soft. He exhibits no distension.  Musculoskeletal:  Full active and passive range of motion of the bilateral ankles and knees.  DP and PT pulses are 2+ and symmetric.  Able to independently move all digits of the bilateral feet.  Good capillary refill of the toes bilaterally.  Sensation is intact and equal throughout the bilateral lower extremities.  5 out of 5 strength against resistance with dorsiflexion and plantarflexion.  No skin tears or superficial  abrasions. no overlying erythema, edema, or warmth to the bilateral feet, ankles, or lower legs.  Calluses are noted to the plantar surface of the feet bilaterally. Calves are nontender bilaterally.  Able to bear weight on the bilateral lower extremities.  He is able to ambulate.  Neurological: He is alert.  Skin: Skin is warm and dry.  Psychiatric: His behavior is normal.  Nursing note and vitals reviewed.    ED Treatments / Results  Labs (all labs ordered are listed, but only abnormal results are displayed) Labs Reviewed - No data to display  EKG None  Radiology No results found.  Procedures Procedures (including critical care time)  Medications Ordered in ED Medications  acetaminophen (TYLENOL) tablet 1,000 mg (has no administration in time range)     Initial Impression / Assessment and  Plan / ED Course  I have reviewed the triage vital signs and the nursing notes.  Pertinent labs & imaging results that were available during my care of the patient were reviewed by me and considered in my medical decision making (see chart for details).     57 year old male with a h/o of CHF, HTN, DM Type II, schizophrenia, and cocaine presenting by EMS with a chief complaint of bilateral foot pain.  He has been evaluated 24 times in the emergency department over the last 6 months of which many visits have been for his bilateral foot pain.  He is requesting Tylenol, new shoes, social worker consult, food and drink, Tylenol prescription, and a place to stay.  The patient has a history of homelessness.  Social work consulted.  The patient's care coordinator, Clydie Braun, at [facility] was unable to be reached. Denny Peon, CSW, left a message with his cousin Sonny Masters who has a place for him to stay, but he frequently leaves because he becomes paranoid with the dogs in the neighborhood and he panhandles per chart review.  Doubt septic arthritis, gout, DVT, fracture, cellulitis, or abscess.  No concerning ulcers  or lower extremity edema.   His blood pressure is 158/112.  He has not taken his blood pressure medication today.  He has no signs or symptoms of endorgan disease with hypertensive urgency or emergency.  No chest pain or shortness of breath.  At this time, I do not feel that the patient needs any further urgent or emergent work-up.  The patient will be given a bus pass with follow-up to the [facility].  He is hemodynamically stable and in no acute distress.  He is able to ambulate in the ED.  Strict return precautions given.  The patient is safe for discharge with outpatient follow-up.  Final Clinical Impressions(s) / ED Diagnoses   Final diagnoses:  Chronic foot pain, left  Chronic foot pain, right    ED Discharge Orders        Ordered    acetaminophen (TYLENOL) 500 MG tablet  Every 6 hours PRN     12/07/17 1113       Magalie Almon A, PA-C 12/07/17 1128    Tilden Fossa, MD 12/09/17 [phone]

## 2017-12-07 NOTE — Discharge Instructions (Addendum)
[name] if [facility].   Take 500 mg of Tylenol every 6 hours as needed.  Return to the emergency department if you develop a fever, chills, or if the feet get red, hot, or swollen to the touch.

## 2017-12-07 NOTE — ED Triage Notes (Signed)
Brought by ems for bil foot pain.  Both feet noted to be white in color as if they have been wet.

## 2017-12-07 NOTE — ED Notes (Signed)
Pt given bus pass for discharge. Pt given clean scrubs and sandwich prior to arrival. Escorted to lobby in wheelchair.

## 2017-12-07 NOTE — ED Notes (Signed)
Pt given graham crackers and milk per request.  

## 2017-12-07 NOTE — ED Notes (Signed)
Patient noted to have on wet clothing.  Foul odor noted.  Assisted to take a shower and placed in scrubs.  Placed in waiting room at this time.

## 2017-12-07 NOTE — Progress Notes (Addendum)
CSW attempted to reach patients cousin, Williemae NatterCandice [phone], for assistance with transportation- no answer, voicemail left for return call.  Per notes, patient does have a care coordinator with [facility], Karen [phone], unable to reach due to the weekend. On previous ED visits patient has discharged [facility]. If unable to reach cousin, please provide patient with bus pass.   Stacy GardnerErin Doris Mcgilvery, [facility] Emergency Room Clinical Social Worker [phone]

## 2017-12-07 NOTE — ED Notes (Signed)
Pt requesting real food tray, not just sandwich.

## 2017-12-08 ENCOUNTER — Emergency Department (HOSPITAL_COMMUNITY)
Admission: EM | Admit: 2017-12-08 | Discharge: 2017-12-08 | Disposition: A | Discharge status: Home / Self Care | Payer: Self-pay | Attending: Emergency Medicine | Admitting: Emergency Medicine

## 2017-12-08 ENCOUNTER — Encounter (HOSPITAL_COMMUNITY): Payer: Self-pay | Admitting: Emergency Medicine

## 2017-12-08 DIAGNOSIS — Z79899 Other long term (current) drug therapy: Secondary | ICD-10-CM | POA: Insufficient documentation

## 2017-12-08 DIAGNOSIS — F17210 Nicotine dependence, cigarettes, uncomplicated: Secondary | ICD-10-CM | POA: Insufficient documentation

## 2017-12-08 DIAGNOSIS — E1349 Other specified diabetes mellitus with other diabetic neurological complication: Secondary | ICD-10-CM | POA: Insufficient documentation

## 2017-12-08 DIAGNOSIS — I110 Hypertensive heart disease with heart failure: Secondary | ICD-10-CM | POA: Insufficient documentation

## 2017-12-08 DIAGNOSIS — I509 Heart failure, unspecified: Secondary | ICD-10-CM | POA: Insufficient documentation

## 2017-12-08 DIAGNOSIS — R32 Unspecified urinary incontinence: Secondary | ICD-10-CM | POA: Insufficient documentation

## 2017-12-08 NOTE — ED Notes (Signed)
Pt given meal

## 2017-12-08 NOTE — ED Notes (Signed)
Patient given wash cloths and soap and scrubs to clean up with.

## 2017-12-08 NOTE — ED Notes (Signed)
Bed: WTR7 Expected date:  Expected time:  Means of arrival:  Comments: 

## 2017-12-08 NOTE — ED Notes (Signed)
Per Dr Juleen ChinaKohut, patient can be seen in [facility] for his complaints

## 2017-12-08 NOTE — ED Provider Notes (Signed)
[facility]-EMERGENCY DEPT Provider Note   CSN: [id number] Arrival date & time: 12/08/17  0813     History   Chief Complaint Chief Complaint  Patient presents with  . prostate issue    HPI [name] is a 57 y.o. male.  HPI   56yM presenting to ED after he urinated on himself at a bus stop. He is requesting to speak with a Child psychotherapistsocial worker. He would like new clothes and something to eat. He reports inconsistent compliance with his medication. He denies SI or HI. Does continue to use cocaine.   Past Medical History:  Diagnosis Date  . CHF (congestive heart failure) (HCC)   . Chronic foot pain   . Cocaine abuse (HCC)   . Diabetes mellitus without complication (HCC)   . Hepatitis C   . Homelessness   . Homelessness   . Hypertension   . Neuropathy   . Polysubstance abuse (HCC)   . Schizophrenia [facility](HCC)     Patient Active Problem List   Diagnosis Date Noted  . Cocaine abuse with cocaine-induced mood disorder (HCC) 09/18/2017  . Chronic foot pain   . Schizoaffective disorder, bipolar type (HCC) 09/30/2016  . Suicidal thoughts   . Substance induced mood disorder (HCC) 03/13/2015  . Acute kidney failure (HCC) 01/26/2015  . Schizophrenia, paranoid type (HCC) 01/17/2015  . Suicidal ideation   . Drug hallucinosis (HCC) 10/08/2014  . Chronic paranoid schizophrenia (HCC) 09/07/2014  . 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 Acute CHF (congestive heart failure) (HCC) 07/29/2014  . Cocaine use disorder, severe, dependence (HCC)   . Cocaine abuse (HCC) 03/10/2014  . Essential hypertension, benign 03/28/2013  . Diabetes mellitus (HCC) 03/15/2013    History reviewed. No pertinent surgical history.      Home Medications    Prior to Admission medications   Medication Sig Start Date End Date Taking? Authorizing Provider  acetaminophen (TYLENOL) 500 MG tablet Take 1 tablet  (500 mg total) by mouth every 6 (six) hours as needed. 12/07/17   McDonald, Mia A, PA-C  ARIPiprazole (ABILIFY) 5 MG tablet Take 5 mg by mouth daily.    [provider]  benztropine (COGENTIN) 1 MG tablet Take 1 mg by mouth daily.    [provider]  carbamazepine (TEGRETOL) 100 MG chewable tablet Chew 100 mg by mouth daily.    [provider]  Cetirizine HCl (ZYRTEC ALLERGY) 10 MG CAPS Take 1 capsule (10 mg total) by mouth daily. Patient not taking: Reported on 11/23/2017 10/03/17   Ward, Layla MawKristen N, DO  fluticasone (FLONASE) 50 MCG/ACT nasal spray Place 2 sprays into both nostrils daily. Patient not taking: Reported on 11/23/2017 10/03/17   Ward, Layla MawKristen N, DO  hydrochlorothiazide (HYDRODIURIL) 25 MG tablet Take 1 tablet (25 mg total) by mouth daily. Patient not taking: Reported on 11/23/2017 10/13/17   Ward, Layla MawKristen N, DO  hydrOXYzine (ATARAX/VISTARIL) 50 MG tablet Take 50 mg by mouth 2 (two) times daily.    [provider]  lisinopril (PRINIVIL,ZESTRIL) 40 MG tablet Take 1 tablet (40 mg total) by mouth daily. Patient not taking: Reported on 11/23/2017 10/13/17   Ward, Layla MawKristen N, DO    Family History Family History  Problem Relation Age of Onset  . Hypertension Other   . Diabetes Other     Social History Social History   Tobacco Use  . Smoking status: Current Every Day Smoker    Packs/day: 1.00  Years: 20.00    Pack years: 20.00    Types: Cigarettes  . Smokeless tobacco: Current User  Substance Use Topics  . Alcohol use: Yes    Comment: Daily Drinker   . Drug use: Yes    Frequency: 7.0 times per week    Types: "Crack" cocaine, Cocaine, Marijuana    Comment: Cocaine tonight, Marijuana "a long time"     Allergies   Haldol [haloperidol]   Review of Systems Review of Systems  All systems reviewed and negative, other than as noted in HPI.  Physical Exam Updated Vital Signs BP (!) 143/101 (BP Location: Right Arm)   Pulse 98   Temp 98.5 F  (36.9 C) (Oral)   Resp 18   SpO2 97%   Physical Exam  Constitutional: He appears well-developed and well-nourished. No distress.  HENT:  Head: Normocephalic and atraumatic.  Eyes: Conjunctivae are normal. Right eye exhibits no discharge. Left eye exhibits no discharge.  Neck: Neck supple.  Cardiovascular: Normal rate, regular rhythm and normal heart sounds. Exam reveals no gallop and no friction rub.  No murmur heard. Pulmonary/Chest: Effort normal and breath sounds normal. No respiratory distress.  Abdominal: Soft. He exhibits no distension. There is no tenderness.  Musculoskeletal: He exhibits no edema or tenderness.  Neurological: He is alert.  Skin: Skin is warm and dry.  Psychiatric: Thought content normal.  Nursing note and vitals reviewed.    ED Treatments / Results  Labs (all labs ordered are listed, but only abnormal results are displayed) Labs Reviewed - No data to display  EKG None  Radiology No results found.  Procedures Procedures (including critical care time)  Medications Ordered in ED Medications - No data to display   Initial Impression / Assessment and Plan / ED Course  I have reviewed the triage vital signs and the nursing notes.  Pertinent labs & imaging results that were available during my care of the patient were reviewed by me and considered in my medical decision making (see chart for details).     56yM brought in after incontinent of urine. He didn't offer any GU complaints when speaking with him though. Complaining to me about foot pain. Hx of neuropathy. No acute findings on exam. Pt requesting new clothes and a meal tray. He was given scrubs, new socks and a sandwich.   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

## 2017-12-08 NOTE — ED Notes (Signed)
Pt given flip flops for d/c and meal tray.

## 2017-12-08 NOTE — ED Triage Notes (Signed)
Per EMS-was called to bus stop r/t diabetic issue-when they arrived patient was standing at bus stop complaining of prostates issues-patient incontinent of urine-states he smoked crack-

## 2017-12-10 ENCOUNTER — Emergency Department (HOSPITAL_COMMUNITY)
Admission: EM | Admit: 2017-12-10 | Discharge: 2017-12-10 | Disposition: A | Discharge status: Home / Self Care | Payer: Medicaid Other | Attending: Emergency Medicine | Admitting: Emergency Medicine

## 2017-12-10 ENCOUNTER — Encounter (HOSPITAL_COMMUNITY): Payer: Self-pay

## 2017-12-10 DIAGNOSIS — I110 Hypertensive heart disease with heart failure: Secondary | ICD-10-CM | POA: Insufficient documentation

## 2017-12-10 DIAGNOSIS — F1410 Cocaine abuse, uncomplicated: Secondary | ICD-10-CM | POA: Diagnosis not present

## 2017-12-10 DIAGNOSIS — F209 Schizophrenia, unspecified: Secondary | ICD-10-CM | POA: Diagnosis not present

## 2017-12-10 DIAGNOSIS — I509 Heart failure, unspecified: Secondary | ICD-10-CM | POA: Diagnosis not present

## 2017-12-10 DIAGNOSIS — F1414 Cocaine abuse with cocaine-induced mood disorder: Secondary | ICD-10-CM | POA: Diagnosis not present

## 2017-12-10 DIAGNOSIS — Z79899 Other long term (current) drug therapy: Secondary | ICD-10-CM | POA: Diagnosis not present

## 2017-12-10 DIAGNOSIS — F17210 Nicotine dependence, cigarettes, uncomplicated: Secondary | ICD-10-CM | POA: Insufficient documentation

## 2017-12-10 DIAGNOSIS — E1140 Type 2 diabetes mellitus with diabetic neuropathy, unspecified: Secondary | ICD-10-CM | POA: Insufficient documentation

## 2017-12-10 DIAGNOSIS — R441 Visual hallucinations: Secondary | ICD-10-CM

## 2017-12-10 DIAGNOSIS — R45851 Suicidal ideations: Secondary | ICD-10-CM | POA: Diagnosis not present

## 2017-12-10 DIAGNOSIS — Z008 Encounter for other general examination: Secondary | ICD-10-CM | POA: Diagnosis present

## 2017-12-10 LAB — CBC
HCT: 37.2 % — ABNORMAL LOW (ref 39.0–52.0)
Hemoglobin: 11.8 g/dL — ABNORMAL LOW (ref 13.0–17.0)
MCH: 29.2 pg (ref 26.0–34.0)
MCHC: 31.7 g/dL (ref 30.0–36.0)
MCV: 92.1 fL (ref 78.0–100.0)
PLATELETS: 164 10*3/uL (ref 150–400)
RBC: 4.04 MIL/uL — ABNORMAL LOW (ref 4.22–5.81)
RDW: 14.3 % (ref 11.5–15.5)
WBC: 5.4 10*3/uL (ref 4.0–10.5)

## 2017-12-10 LAB — COMPREHENSIVE METABOLIC PANEL
ALK PHOS: 84 U/L (ref 38–126)
ALT: 12 U/L — ABNORMAL LOW (ref 17–63)
ANION GAP: 7 (ref 5–15)
AST: 18 U/L (ref 15–41)
Albumin: 3.7 g/dL (ref 3.5–5.0)
BUN: 13 mg/dL (ref 6–20)
CALCIUM: 9.3 mg/dL (ref 8.9–10.3)
CO2: 30 mmol/L (ref 22–32)
Chloride: 108 mmol/L (ref 101–111)
Creatinine, Ser: 0.87 mg/dL (ref 0.61–1.24)
GFR calc non Af Amer: >60 mL/min (ref >60)
Glucose, Bld: 172 mg/dL — ABNORMAL HIGH (ref 65–99)
Potassium: 3.6 mmol/L (ref 3.5–5.1)
Sodium: 145 mmol/L (ref 135–145)
Total Bilirubin: 0.5 mg/dL (ref 0.3–1.2)
Total Protein: 6.9 g/dL (ref 6.5–8.1)

## 2017-12-10 LAB — RAPID URINE DRUG SCREEN, HOSP PERFORMED
Amphetamines: NOT DETECTED
BARBITURATES: NOT DETECTED
Benzodiazepines: POSITIVE — AB
COCAINE: POSITIVE — AB
Opiates: NOT DETECTED
Tetrahydrocannabinol: NOT DETECTED

## 2017-12-10 LAB — ETHANOL

## 2017-12-10 LAB — ACETAMINOPHEN LEVEL

## 2017-12-10 LAB — SALICYLATE LEVEL

## 2017-12-10 NOTE — BH Assessment (Signed)
[facility] Assessment Progress Note  Per Jacqueline Norman, DO, this pt does not require psychiatric hospitalization at this time.  Pt is to be discharged from [facility] with recommendation to follow up with [name].  This has been included in pt's discharge instructions.  Pt would also benefit from seeing Peer Support Specialists; they will be asked to speak to pt.  Pt's nurse, Ashley, has been notified.  Taylor Kaminsky, MA Triage Specialist [phone]     

## 2017-12-10 NOTE — ED Notes (Signed)
The pt stated he see and hear monsters and a dragon has been trying to kill him for the past 2 weeks.  The pt denies current SI/HI. Plan of care discussed. Encouragement and support provided and safety maintain. Q 15 min safety checks in place and video monitoring.

## 2017-12-10 NOTE — ED Notes (Signed)
Pt d/c home per MD order. Discharge summary reviewed with pt. Pt irate, hostile, verbally aggressive, verbally threatening nursing staff. Yelling, cussing. Bus pass provided. Pt ambulatory off unit with security and [employer].

## 2017-12-10 NOTE — ED Notes (Signed)
Peer support at bedside 

## 2017-12-10 NOTE — BH Assessment (Addendum)
Assessment Note  Taylor MemosFrederick L [name] is an 57 y.o. male.  The pt came in to the hospital "to get some help".  [facility] [place] [facility]  The pt stated he see and hear monsters and a dragon has been trying to kill him for the past 2 weeks.  The pt denies current SI.  He is currently not seeing a counselor or psychiatrist.  He was previously going to [name].  His last hospitalization at [name] was 09/2016.    The pt is divorced and is not working currently.  He is homeless and has been homeless for several years.  The pt denies self harm, having access to a gun and HI.  The pt has a court date on July for trespassing.  He reported he is sleeping well and eating well.  The pt  UDS is positive for cocaine and benzodiazapine's.  The pt reported he last had cocaine earlier today.  During the assessment the pt was drowsy and went to sleep during the assessment several times.  Diagnosis:  F14.20 Cocaine use disorder, Severe F20.9 Schizophrenia   Past Medical History:  Past Medical History:  Diagnosis Date  . CHF (congestive heart failure) (HCC)   . Chronic foot pain   . Cocaine abuse (HCC)   . Diabetes mellitus without complication (HCC)   . Hepatitis C   . Homelessness   . Homelessness   . Hypertension   . Neuropathy   . Polysubstance abuse (HCC)   . Schizophrenia (HCC)     History reviewed. No pertinent surgical history.  Family History:  Family History  Problem Relation Age of Onset  . Hypertension Other   . Diabetes Other     Social History:  reports that he has been smoking cigarettes.  He has a 20.00 pack-year smoking history. He uses smokeless tobacco. He reports that he drinks alcohol. He reports that he has current or past drug history. Drugs: "Crack" cocaine, Cocaine, and Marijuana. Frequency: 7.00 times per week.  Additional Social History:  Alcohol / Drug Use Pain Medications: See MAR Prescriptions: See MAR Over the  Counter: See MAR History of alcohol / drug use?: Yes Longest period of sobriety (when/how long): unknown Substance #1 Name of Substance 1: cocaine 1 - Last Use / Amount: 12/09/2017  CIWA: CIWA-Ar BP: (!) 191/105(pt not on his HTN meds) Pulse Rate: 87 COWS:    Allergies:  Allergies  Allergen Reactions  . Haldol [Haloperidol] Other (See Comments)    Muscle spasms, loss of voluntary movement. However, pt has taken Thorazine on multiple occasions with no adverse effects.     Home Medications:  (Not in a hospital admission)  OB/GYN Status:  No LMP for male patient.  General Assessment Data Location of Assessment: [facility] TTS Assessment: In system Is this a Tele or Face-to-Face Assessment?: Face-to-Face Is this an Initial Assessment or a Re-assessment for this encounter?: Initial Assessment Marital status: Single Maiden name: NA Is patient pregnant?: Other (Comment)(male) Living Arrangements: Other (Comment)(homeless) Can pt return to current living arrangement?: Yes Admission Status: Voluntary Is patient capable of signing voluntary admission?: Yes Referral Source: Self/Family/Friend Insurance type: Self Pay     Crisis Care Plan Living Arrangements: Other (Comment)(homeless) Legal Guardian: Other:(Self) Name of Psychiatrist: none Name of Therapist: none  Education Status Is patient currently in school?: No Is the patient employed, unemployed or receiving disability?: Unemployed  Risk to self with the past 6 months  Suicidal Ideation: No Has patient been a risk to self within the past 6 months prior to admission? : No Suicidal Intent: No Has patient had any suicidal intent within the past 6 months prior to admission? : No Is patient at risk for suicide?: No Suicidal Plan?: No Has patient had any suicidal plan within the past 6 months prior to admission? : No Access to Means: No What has been your use of drugs/alcohol within the last 12 months?: cocaine use Previous  Attempts/Gestures: No How many times?: 0 Other Self Harm Risks: drug use Triggers for Past Attempts: Unknown Intentional Self Injurious Behavior: None Family Suicide History: No Recent stressful life event(s): Other (Comment)(homeless) Persecutory voices/beliefs?: Yes Depression: Yes Depression Symptoms: Feeling worthless/self pity Substance abuse history and/or treatment for substance abuse?: Yes Suicide prevention information given to non-admitted patients: Not applicable  Risk to Others within the past 6 months Homicidal Ideation: No Does patient have any lifetime risk of violence toward others beyond the six months prior to admission? : No Thoughts of Harm to Others: No Current Homicidal Intent: No Current Homicidal Plan: No Access to Homicidal Means: No Identified Victim: none History of harm to others?: No Assessment of Violence: None Noted Violent Behavior Description: none Does patient have access to weapons?: No Criminal Charges Pending?: No Does patient have a court date: Yes Court Date: 12/30/17 Is patient on probation?: No  Psychosis Hallucinations: Visual Delusions: Persecutory  Mental Status Report Appearance/Hygiene: Disheveled, In scrubs Eye Contact: Poor Motor Activity: Unable to assess Speech: Slurred Level of Consciousness: Sleeping, Drowsy Mood: Depressed Affect: Appropriate to circumstance Anxiety Level: None Thought Processes: Coherent, Relevant Judgement: Impaired Orientation: Person, Place, Time, Situation Obsessive Compulsive Thoughts/Behaviors: None  Cognitive Functioning Concentration: Normal Memory: Recent Intact, Remote Intact Is patient IDD: No Is patient DD?: No Insight: Poor Impulse Control: Poor Appetite: Good Have you had any weight changes? : No Change Sleep: No Change Total Hours of Sleep: 8 Vegetative Symptoms: None  ADLScreening [facility] Assessment Services) Patient's cognitive ability adequate to safely complete daily  activities?: Yes Patient able to express need for assistance with ADLs?: Yes Independently performs ADLs?: Yes (appropriate for developmental age)  Prior Inpatient Therapy Prior Inpatient Therapy: Yes Prior Therapy Dates: unknown Prior Therapy Facilty/Provider(s): can't remember Reason for Treatment: substance abuse  Prior Outpatient Therapy Prior Outpatient Therapy: No Does patient have an ACCT team?: No Does patient have Intensive In-House Services?  : No Does patient have [name] services? : No Does patient have P4CC services?: No  ADL Screening (condition at time of admission) Patient's cognitive ability adequate to safely complete daily activities?: Yes Patient able to express need for assistance with ADLs?: Yes Independently performs ADLs?: Yes (appropriate for developmental age)       Abuse/Neglect Assessment (Assessment to be complete while patient is alone) Abuse/Neglect Assessment Can Be Completed: Yes Physical Abuse: Denies Verbal Abuse: Denies Sexual Abuse: Denies Exploitation of patient/patient's resources: Denies Self-Neglect: Denies Values / Beliefs Cultural Requests During Hospitalization: None Spiritual Requests During Hospitalization: None Consults Spiritual Care Consult Needed: No Social Work Consult Needed: No            Disposition:  Disposition Initial Assessment Completed for this Encounter: Yes  NP Nira Conn recommends the pt be observed overnight for safety and stabilization.  The pt is to be reassessed by psychiatry in the morning.  RN Victorino Dike and PA Melvenia Beam were made aware of the recommendation.  On Site Evaluation by:   Reviewed with Physician:  Riley Churches [facility] 12/10/2017 5:59 AM

## 2017-12-10 NOTE — Discharge Instructions (Signed)
For your mental health needs, you are advised to follow up with [name].  New and returning patients are seen at their walk-in clinic.  Walk-in hours are Monday - Friday from 8:00 am - 3:00 pm.  Walk-in patients are seen on a first come, first served basis.  Try to arrive as early as possible for he best chance of being seen the same day: ° °     [name] °     [address] °     [place], [place] [address] °     [phone] °

## 2017-12-10 NOTE — [facility] Suicide Risk Assessment (Signed)
Suicide Risk Assessment  Discharge Assessment   [facility]   Principal Problem: Cocaine abuse with cocaine-induced mood disorder [facility](HCC) Discharge Diagnoses:  Patient Active Problem List   Diagnosis Date Noted  . Cocaine abuse with cocaine-induced mood disorder [facility](HCC) [F14.14] 09/18/2017    Priority: High  . Schizophrenia, paranoid type (HCC) [F20.0] 01/17/2015    Priority: High  . Drug hallucinosis [facility](HCC) [F19.951] 10/08/2014    Priority: High  . Chronic paranoid schizophrenia (HCC) [F20.0] 09/07/2014    Priority: High  . Cocaine use disorder, severe, dependence (HCC) [F14.20]     Priority: High  . Cocaine abuse (HCC) [F14.10] 03/10/2014    Priority: High  . Chronic foot pain [M79.673, G89.29]   . Schizoaffective disorder, bipolar type (HCC) [F25.0] 09/30/2016  . Suicidal thoughts [R45.851]   . Substance induced mood disorder (HCC) [F19.94] 03/13/2015  . Acute kidney failure (HCC) [N17.9] 01/26/2015  . Suicidal ideation [R45.851]   . Substance or medication-induced bipolar and related disorder with onset during intoxication (HCC) [Z61.096[F19.929, F19.94] 08/10/2014  . Acute CHF (congestive heart failure) (HCC) [I50.9] 07/29/2014  . Essential hypertension, benign [I10] 03/28/2013  . Diabetes mellitus (HCC) [E11.9] 03/15/2013    Total Time spent with patient: 45 minutes  Musculoskeletal: Strength & Muscle Tone: within normal limits Gait & Station: normal Patient leans: N/A  Psychiatric Specialty Exam:   Blood pressure (!) 163/91, pulse 81, temperature 98 F (36.7 C), temperature source Oral, resp. rate 18, SpO2 100 %.There is no height or weight on file to calculate BMI.  General Appearance: Casual  Eye Contact::  Good  Speech:  Normal Rate409  Volume:  Normal  Mood:  Irritable  Affect:  Congruent  Thought Process:  Coherent and Descriptions of Associations: Intact  Orientation:  Full (Time, Place, and Person)  Thought Content:  WDL and Logical   Suicidal Thoughts:  No  Homicidal Thoughts:  No  Memory:  Immediate;   Good Recent;   Good Remote;   Good  Judgement:  Fair  Insight:  Fair  Psychomotor Activity:  Normal  Concentration:  Good  Recall:  Good  Fund of Knowledge:Fair  Language: Good  Akathisia:  No  Handed:  Right  AIMS (if indicated):     Assets:  Leisure Time Physical Health Resilience  Sleep:     Cognition: WNL  ADL's:  Intact   Mental Status Per Nursing Assessment::   On Admission:   57 yo male who came to the ED after using cocaine and having hallucinations.  Today, he is clear and coherent with no suicidal/homicidal ideations, hallucinations, or withdrawal symptoms.  Peer support consult placed.  Stable for discharges.  Demographic Factors:  Male  Loss Factors: NA  Historical Factors: NA  Risk Reduction Factors:   Sense of responsibility to family and Positive therapeutic relationship  Continued Clinical Symptoms:  Irritability  Cognitive Features That Contribute To Risk:  None    Suicide Risk:  Minimal: No identifiable suicidal ideation.  Patients presenting with no risk factors but with morbid ruminations; may be classified as minimal risk based on the severity of the depressive symptoms    Plan Of Care/Follow-up recommendations:  Activity:  as tolerated Diet:  heart healthy diet  LORD, JAMISON, NP 12/10/2017, 12:18 PM

## 2017-12-10 NOTE — ED Triage Notes (Signed)
Pt [facility] because he's suicidal and doesn't want to be in this world anymore, pt states that he's been homeless for 30 years and he's tired

## 2017-12-10 NOTE — ED Notes (Signed)
Patient refused vitals. Nurse Morrie Sheldonashley notified.

## 2017-12-10 NOTE — ED Provider Notes (Signed)
[facility]-EMERGENCY DEPT Provider Note   CSN: [id number] Arrival date & time: 12/10/17  0116     History   Chief Complaint Chief Complaint  Patient presents with  . Suicidal    HPI [name] is a 57 y.o. male.  Patient well known to the ED presents with complaint of visual hallucinations and SI. He feels he is in danger of self harm and is requesting help. He admits to ongoing cocaine use.   The history is provided by the patient. No language interpreter was used.    Past Medical History:  Diagnosis Date  . CHF (congestive heart failure) (HCC)   . Chronic foot pain   . Cocaine abuse (HCC)   . Diabetes mellitus without complication (HCC)   . Hepatitis C   . Homelessness   . Homelessness   . Hypertension   . Neuropathy   . Polysubstance abuse (HCC)   . Schizophrenia [facility])     Patient Active Problem List   Diagnosis Date Noted  . Cocaine abuse with cocaine-induced mood disorder (HCC) 09/18/2017  . Chronic foot pain   . Schizoaffective disorder, bipolar type (HCC) 09/30/2016  . Suicidal thoughts   . Substance induced mood disorder (HCC) 03/13/2015  . Acute kidney failure (HCC) 01/26/2015  . Schizophrenia, paranoid type (HCC) 01/17/2015  . Suicidal ideation   . Drug hallucinosis (HCC) 10/08/2014  . Chronic paranoid schizophrenia (HCC) 09/07/2014  . 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 Acute CHF (congestive heart failure) (HCC) 07/29/2014  . Cocaine use disorder, severe, dependence (HCC)   . Cocaine abuse (HCC) 03/10/2014  . Essential hypertension, benign 03/28/2013  . Diabetes mellitus (HCC) 03/15/2013    History reviewed. No pertinent surgical history.      Home Medications    Prior to Admission medications   Medication Sig Start Date End Date Taking? Authorizing Provider  ARIPiprazole (ABILIFY) 5 MG tablet Take 5 mg by mouth daily.   Yes  [provider]  benztropine (COGENTIN) 1 MG tablet Take 1 mg by mouth daily.   Yes [provider]  carbamazepine (TEGRETOL) 100 MG chewable tablet Chew 100 mg by mouth daily.   Yes [provider]  hydrOXYzine (ATARAX/VISTARIL) 50 MG tablet Take 50 mg by mouth 2 (two) times daily.   Yes [provider]  acetaminophen (TYLENOL) 500 MG tablet Take 1 tablet (500 mg total) by mouth every 6 (six) hours as needed. Patient not taking: Reported on 12/10/2017 12/07/17   McDonald, Mia A, PA-C  Cetirizine HCl (ZYRTEC ALLERGY) 10 MG CAPS Take 1 capsule (10 mg total) by mouth daily. Patient not taking: Reported on 11/23/2017 10/03/17   Ward, Layla Maw, DO  fluticasone (FLONASE) 50 MCG/ACT nasal spray Place 2 sprays into both nostrils daily. Patient not taking: Reported on 11/23/2017 10/03/17   Ward, Layla Maw, DO  hydrochlorothiazide (HYDRODIURIL) 25 MG tablet Take 1 tablet (25 mg total) by mouth daily. Patient not taking: Reported on 11/23/2017 10/13/17   Ward, Layla Maw, DO  lisinopril (PRINIVIL,ZESTRIL) 40 MG tablet Take 1 tablet (40 mg total) by mouth daily. Patient not taking: Reported on 11/23/2017 10/13/17   Ward, Layla Maw, DO    Family History Family History  Problem Relation Age of Onset  . Hypertension Other   . Diabetes Other     Social History Social History   Tobacco Use  . Smoking status: Current Every Day Smoker    Packs/day:  1.00    Years: 20.00    Pack years: 20.00    Types: Cigarettes  . Smokeless tobacco: Current User  Substance Use Topics  . Alcohol use: Yes    Comment: Daily Drinker   . Drug use: Yes    Frequency: 7.0 times per week    Types: "Crack" cocaine, Cocaine, Marijuana    Comment: Cocaine tonight, Marijuana "a long time"     Allergies   Haldol [haloperidol]   Review of Systems Review of Systems  Constitutional: Negative for chills and fever.  HENT: Negative.   Respiratory: Negative.   Cardiovascular: Negative.     Gastrointestinal: Negative.   Musculoskeletal: Negative.   Skin: Negative.   Neurological: Negative.   Psychiatric/Behavioral: Positive for hallucinations and suicidal ideas.     Physical Exam Updated Vital Signs BP (!) 191/105 (BP Location: Left Arm) Comment: pt not on his HTN meds  Pulse 87   Temp 98.7 F (37.1 C) (Oral)   Resp 15   SpO2 100%   Physical Exam  Constitutional: He is oriented to person, place, and time. He appears well-developed and well-nourished.  HENT:  Head: Normocephalic.  Neck: Normal range of motion. Neck supple.  Cardiovascular: Normal rate and regular rhythm.  Pulmonary/Chest: Effort normal and breath sounds normal.  Abdominal: Soft. Bowel sounds are normal. There is no tenderness. There is no rebound and no guarding.  Musculoskeletal: Normal range of motion.  Neurological: He is alert and oriented to person, place, and time.  Skin: Skin is warm and dry. No rash noted.  Psychiatric: His affect is labile. His speech is rapid and/or pressured. He is actively hallucinating. He expresses suicidal ideation.     ED Treatments / Results  Labs (all labs ordered are listed, but only abnormal results are displayed) Labs Reviewed  COMPREHENSIVE METABOLIC PANEL - Abnormal; Notable for the following components:      Result Value   Glucose, Bld 172 (*)    ALT 12 (*)    All other components within normal limits  ACETAMINOPHEN LEVEL - Abnormal; Notable for the following components:   Acetaminophen (Tylenol), Serum <10 (*)    All other components within normal limits  CBC - Abnormal; Notable for the following components:   RBC 4.04 (*)    Hemoglobin 11.8 (*)    HCT 37.2 (*)    All other components within normal limits  RAPID URINE DRUG SCREEN, HOSP PERFORMED - Abnormal; Notable for the following components:   Cocaine POSITIVE (*)    Benzodiazepines POSITIVE (*)    All other components within normal limits  ETHANOL  SALICYLATE LEVEL     EKG None  Radiology No results found.  Procedures Procedures (including critical care time)  Medications Ordered in ED Medications - No data to display   Initial Impression / Assessment and Plan / ED Course  I have reviewed the triage vital signs and the nursing notes.  Pertinent labs & imaging results that were available during my care of the patient were reviewed by me and considered in my medical decision making (see chart for details).     Patient well known to ED with long history substance abuse and schizophrenia. He is here with c/o visual hallucinations and SI. TTS consultation to determine disposition.   Final Clinical Impressions(s) / ED Diagnoses   Final diagnoses:  None   1. SI 2. Visual hallucinations 3. History of cocaine abuse. ED Discharge Orders    None  Elpidio AnisUpstill, Latera Mclin, PA-C 12/10/17 [id number]    Devoria AlbeKnapp, Iva, MD 12/10/17 [id number]

## 2017-12-12 ENCOUNTER — Other Ambulatory Visit: Payer: Self-pay

## 2017-12-12 ENCOUNTER — Encounter (HOSPITAL_COMMUNITY): Payer: Self-pay

## 2017-12-12 ENCOUNTER — Emergency Department (HOSPITAL_COMMUNITY)
Admission: EM | Admit: 2017-12-12 | Discharge: 2017-12-12 | Disposition: A | Discharge status: Home / Self Care | Payer: Medicaid Other | Attending: Emergency Medicine | Admitting: Emergency Medicine

## 2017-12-12 DIAGNOSIS — M79671 Pain in right foot: Secondary | ICD-10-CM

## 2017-12-12 DIAGNOSIS — F1414 Cocaine abuse with cocaine-induced mood disorder: Secondary | ICD-10-CM | POA: Insufficient documentation

## 2017-12-12 DIAGNOSIS — I110 Hypertensive heart disease with heart failure: Secondary | ICD-10-CM | POA: Insufficient documentation

## 2017-12-12 DIAGNOSIS — Z79899 Other long term (current) drug therapy: Secondary | ICD-10-CM | POA: Insufficient documentation

## 2017-12-12 DIAGNOSIS — R45851 Suicidal ideations: Secondary | ICD-10-CM | POA: Insufficient documentation

## 2017-12-12 DIAGNOSIS — M79673 Pain in unspecified foot: Secondary | ICD-10-CM | POA: Diagnosis present

## 2017-12-12 DIAGNOSIS — E119 Type 2 diabetes mellitus without complications: Secondary | ICD-10-CM | POA: Diagnosis not present

## 2017-12-12 DIAGNOSIS — I509 Heart failure, unspecified: Secondary | ICD-10-CM | POA: Insufficient documentation

## 2017-12-12 DIAGNOSIS — F17210 Nicotine dependence, cigarettes, uncomplicated: Secondary | ICD-10-CM | POA: Insufficient documentation

## 2017-12-12 DIAGNOSIS — F209 Schizophrenia, unspecified: Secondary | ICD-10-CM | POA: Diagnosis not present

## 2017-12-12 DIAGNOSIS — M79672 Pain in left foot: Secondary | ICD-10-CM

## 2017-12-12 LAB — COMPREHENSIVE METABOLIC PANEL
ALBUMIN: 3.7 g/dL (ref 3.5–5.0)
ALT: 11 U/L — ABNORMAL LOW (ref 17–63)
ANION GAP: 7 (ref 5–15)
AST: 15 U/L (ref 15–41)
Alkaline Phosphatase: 87 U/L (ref 38–126)
BILIRUBIN TOTAL: 0.6 mg/dL (ref 0.3–1.2)
BUN: 13 mg/dL (ref 6–20)
CO2: 26 mmol/L (ref 22–32)
Calcium: 9.4 mg/dL (ref 8.9–10.3)
Chloride: 108 mmol/L (ref 101–111)
Creatinine, Ser: 0.86 mg/dL (ref 0.61–1.24)
GFR calc non Af Amer: >60 mL/min (ref >60)
Glucose, Bld: 186 mg/dL — ABNORMAL HIGH (ref 65–99)
POTASSIUM: 3.5 mmol/L (ref 3.5–5.1)
Sodium: 141 mmol/L (ref 135–145)
TOTAL PROTEIN: 7.2 g/dL (ref 6.5–8.1)

## 2017-12-12 LAB — CBC
HCT: 38.3 % — ABNORMAL LOW (ref 39.0–52.0)
Hemoglobin: 12.4 g/dL — ABNORMAL LOW (ref 13.0–17.0)
MCH: 29.3 pg (ref 26.0–34.0)
MCHC: 32.4 g/dL (ref 30.0–36.0)
MCV: 90.5 fL (ref 78.0–100.0)
PLATELETS: 207 10*3/uL (ref 150–400)
RBC: 4.23 MIL/uL (ref 4.22–5.81)
RDW: 14.2 % (ref 11.5–15.5)
WBC: 7.5 10*3/uL (ref 4.0–10.5)

## 2017-12-12 LAB — RAPID URINE DRUG SCREEN, HOSP PERFORMED
Amphetamines: NOT DETECTED
BENZODIAZEPINES: POSITIVE — AB
Cocaine: POSITIVE — AB
Opiates: NOT DETECTED
Tetrahydrocannabinol: NOT DETECTED

## 2017-12-12 LAB — ETHANOL

## 2017-12-12 LAB — ACETAMINOPHEN LEVEL: Acetaminophen (Tylenol), Serum: <10 ug/mL — ABNORMAL LOW (ref 10–30)

## 2017-12-12 LAB — SALICYLATE LEVEL

## 2017-12-12 NOTE — Discharge Instructions (Signed)
For your mental health needs, you are advised to follow up with one of the following providers.  Contact them at your earliest opportunity:       [facility]      [address]      [place], [place] [address]      [phone]      New patients are seen at their walk-in clinic.  Walk-in hours are Monday - Friday from 8:00 am - 12:00 pm, and from 1:00 pm - 3:00 pm.  Walk-in patients are seen on a first come, first served basis, so try to arrive as early as possible for the best chance of being seen the same day.  There is an initial fee of $22.50.       [name]      [address]      [place], [place] [address]      [phone]      New and returning patients are seen at their walk-in clinic.  Walk-in hours are Monday - Friday from 8:00 am - 3:00 pm.  Walk-in patients are seen on a first come, first served basis.  Try to arrive as early as possible for he best chance of being seen the same day.

## 2017-12-12 NOTE — ED Triage Notes (Signed)
Pt called [facility] for suicidal ideation. No plan. He reports bilateral foot pain (10/10) that is chronic. Also reports hypertension.

## 2017-12-12 NOTE — BHH Suicide Risk Assessment (Cosign Needed)
Suicide Risk Assessment  Discharge Assessment   [facility] Discharge Suicide Risk Assessment   Principal Problem: Cocaine abuse with cocaine-induced mood disorder [facility]) Discharge Diagnoses:  Patient Active Problem List   Diagnosis Date Noted  . Cocaine abuse with cocaine-induced mood disorder (HCC) [F14.14] 09/18/2017  . Chronic foot pain [M79.673, G89.29]   . Schizoaffective disorder, bipolar type (HCC) [F25.0] 09/30/2016  . Suicidal thoughts [R45.851]   . Substance induced mood disorder (HCC) [F19.94] 03/13/2015  . Acute kidney failure (HCC) [N17.9] 01/26/2015  . Schizophrenia, paranoid type (HCC) [F20.0] 01/17/2015  . Suicidal ideation [R45.851]   . 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 Substance or medication-induced bipolar and related disorder with onset during intoxication (HCC) [H47.425, F19.94] 08/10/2014  . Acute CHF (congestive heart failure) (HCC) [I50.9] 07/29/2014  . Cocaine use disorder, severe, dependence (HCC) [F14.20]   . Cocaine abuse (HCC) [F14.10] 03/10/2014  . Essential hypertension, benign [I10] 03/28/2013  . Diabetes mellitus (HCC) [E11.9] 03/15/2013   Pt was seen and chart reviewed with treatment team and Dr Sharma Covert. Pt denies suicidal/homicidal ideation, denies auditory/visual hallucinations and does not appear to be responding to internal stimuli. Pt stated he wants to go to rehab and only came to the hospital because he is homeless and wanted a place to sleep. Pt stated his cousin is going to let him stay with her upon discharge and she will help him get into rehab. Pt's UDS positive for benzos and cocaine, BAL negative. Pt is stable and psychiatrically clear for discharge.   Total Time spent with patient: 30 minutes  Musculoskeletal: Strength & Muscle Tone: within normal limits Gait & Station: normal Patient leans: N/A  Psychiatric Specialty Exam:   Blood pressure (!) 151/94, pulse 86,  temperature 97.8 F (36.6 C), temperature source Oral, resp. rate 16, SpO2 98 %.There is no height or weight on file to calculate BMI.  General Appearance: Casual  Eye Contact::  Good  Speech:  Clear and Coherent409  Volume:  Normal  Mood:  Anxious and Depressed  Affect:  Congruent and Depressed  Thought Process:  Coherent, Goal Directed and Linear  Orientation:  Full (Time, Place, and Person)  Thought Content:  Logical  Suicidal Thoughts:  No  Homicidal Thoughts:  No  Memory:  Immediate;   Good Recent;   Good Remote;   Fair  Judgement:  Fair  Insight:  Fair  Psychomotor Activity:  Normal  Concentration:  Good  Recall:  Good  Fund of Knowledge:Good  Language: Good  Akathisia:  No  Handed:  Right  AIMS (if indicated):     Assets:  Communication Skills Social Support  Sleep:     Cognition: WNL  ADL's:  Intact   Mental Status Per Nursing Assessment::   On Admission:   suicidal ideation while high on cocaine  Demographic Factors:  Male, Low socioeconomic status and Unemployed  Loss Factors: Financial problems/change in socioeconomic status  Historical Factors: Impulsivity  Risk Reduction Factors:   Sense of responsibility to family  Continued Clinical Symptoms:  Depression:   Comorbid alcohol abuse/dependence Impulsivity Alcohol/Substance Abuse/Dependencies  Cognitive Features That Contribute To Risk:  Closed-mindedness    Suicide Risk:  Minimal: No identifiable suicidal ideation.  Patients presenting with no risk factors but with morbid ruminations; may be classified as minimal risk based on the severity of the depressive symptoms  Follow-up Information    [name], [name], NP Follow up.   Why:  [name] can  follow up with you about your foot pain. Contact information: [address] [place] [address] [phone]           Plan Of Care/Follow-up recommendations:  Activity:  as tolerated Diet:  Heart Healthy  Laveda AbbeLaurie Britton Parks,  NP 12/12/2017, 12:38 PM

## 2017-12-12 NOTE — BH Assessment (Addendum)
Assessment Note  Mellody MemosFrederick L Rodelo is an 57 y.o. male, who presents voluntary and [facility]accompan[facility]. Pt was a poor historian. The pt kept falling asleep off and on during the assessment, clinician prompted the pt to engage. [facility] with a similar presentation. Clinician asked the pt, "what brought you to the hospital?" Pt reported, "my sickness." Pt reported, stomach pain and he was not been given anything for his pain. Pt reported, he is suicidal with a plan to fight. Pt reported, seeing things. Pt reported, "I see humans...umm and demons, yeah I see demons." Clinician asked the pt if he has any self-injurious behaviors. Pt reported, "not yet, it's possible." Pt denies, HI, self-injurious behaviors.   Clinician was unable to assess the following: history of abuse, symptoms of depression, access to weapons, orientation, history of violence, substance use, DSS involvement, educational status, contract for safety, legal involvement, vegetative symptoms. Pt denies,being linked to OPT resources (medication management and/or counseling.)   Pt's UDS is substance use.  Pt presents drowsy, sleeping in scrubs with slurred speech. Pt's mood was helpless. Pt's affect was congruent with mood. Pt's thought process was circumstantial. Pt's judgement was impaired. Pt's concentration, insight and impulse control are poor.   Diagnosis: F20.9 Schizophrenia.  Past Medical History:  Past Medical History:  Diagnosis Date  . CHF (congestive heart failure) (HCC)   . Chronic foot pain   . Cocaine abuse (HCC)   . Diabetes mellitus without complication (HCC)   . Hepatitis C   . Homelessness   . Homelessness   . Hypertension   . Neuropathy   . Polysubstance abuse (HCC)   . Schizophrenia (HCC)     History reviewed. No pertinent surgical history.  Family History:  Family History  Problem Relation Age of Onset  . Hypertension Other   . Diabetes Other     Social History:   reports that he has been smoking cigarettes.  He has a 20.00 pack-year smoking history. He uses smokeless tobacco. He reports that he drinks alcohol. He reports that he has current or past drug history. Drugs: "Crack" cocaine, Cocaine, and Marijuana. Frequency: 7.00 times per week.  Additional Social History:  Alcohol / Drug Use Pain Medications: See MAR Prescriptions: See MAR Over the Counter: See MAR History of alcohol / drug use?: (Pt's UDS is pending.)  CIWA: CIWA-Ar BP: (!) 151/94 Pulse Rate: 86 COWS:    Allergies:  Allergies  Allergen Reactions  . Haldol [Haloperidol] Other (See Comments)    Muscle spasms, loss of voluntary movement. However, pt has taken Thorazine on multiple occasions with no adverse effects.     Home Medications:  (Not in a hospital admission)  OB/GYN Status:  No LMP for male patient.  General Assessment Data Location of Assessment: [facility] ED TTS Assessment: In system Is this a Tele or Face-to-Face Assessment?: Face-to-Face Is this an Initial Assessment or a Re-assessment for this encounter?: Initial Assessment Marital status: Single Living Arrangements: Other (Comment)(Homeless.) Can pt return to current living arrangement?: Yes Admission Status: Voluntary Is patient capable of signing voluntary admission?: Yes Referral Source: Self/Family/Friend Insurance type: Self-pay.     Crisis Care Plan Living Arrangements: Other (Comment)(Homeless.) Legal Guardian: Other:(Self. ) Name of Psychiatrist: NA Name of Therapist: NA  Education Status Is patient currently in school?: No(Per chart.) Is the patient employed, unemployed or receiving disability?: Unemployed(Per chart.)  Risk to self with the past 6 months Suicidal Ideation: Yes-Currently Present Has patient been a risk  to self within the past 6 months prior to admission? : No Suicidal Intent: Yes-Currently Present Has patient had any suicidal intent within the past 6 months prior to admission? :  No Is patient at risk for suicide?: Yes Suicidal Plan?: Yes-Currently Present Has patient had any suicidal plan within the past 6 months prior to admission? : No Specify Current Suicidal Plan: Pt reported, to fight.  Access to Means: (UTA) What has been your use of drugs/alcohol within the last 12 months?: Pt's UDS is pending. Previous Attempts/Gestures: (UTA) How many times?: (UTA) Other Self Harm Risks: Drug use.  Triggers for Past Attempts: Unknown Intentional Self Injurious Behavior: (UTA) Family Suicide History: Unable to assess Recent stressful life event(s): Other (Comment)(Homelessness.) Persecutory voices/beliefs?: No Depression: Yes Depression Symptoms: Feeling angry/irritable, Feeling worthless/self pity, Loss of interest in usual pleasures, Guilt, Fatigue, Isolating Substance abuse history and/or treatment for substance abuse?: Yes Suicide prevention information given to non-admitted patients: Not applicable  Risk to Others within the past 6 months Homicidal Ideation: No Does patient have any lifetime risk of violence toward others beyond the six months prior to admission? : No Thoughts of Harm to Others: No Current Homicidal Intent: No Current Homicidal Plan: No Access to Homicidal Means: No Identified Victim: NA History of harm to others?: No Assessment of Violence: None Noted Violent Behavior Description: NA Does patient have access to weapons?: (UTA) Criminal Charges Pending?: (UTA) Does patient have a court date: (UTA) Court Date: (UTA) Is patient on probation?: (UTA)  Psychosis Hallucinations: Auditory, Visual Delusions: None noted  Mental Status Report Appearance/Hygiene: In scrubs, Body odor Eye Contact: Poor Motor Activity: Unremarkable Speech: Slurred Level of Consciousness: Drowsy, Sleeping Mood: Helpless Affect: Other (Comment) Anxiety Level: None Thought Processes: Circumstantial Judgement: Impaired Orientation: Unable to assess Obsessive  Compulsive Thoughts/Behaviors: Unable to Assess  Cognitive Functioning Concentration: Poor Memory: Recent Impaired Is patient IDD: No Is patient DD?: No Insight: Poor Impulse Control: Poor Appetite: (UTA) Sleep: Unable to Assess Total Hours of Sleep: (UTA) Vegetative Symptoms: Unable to Assess  ADLScreening [facility] Assessment Services) Patient's cognitive ability adequate to safely complete daily activities?: Yes(Per chart.) Patient able to express need for assistance with ADLs?: Yes(Per chart.) Independently performs ADLs?: Yes (appropriate for developmental age)(Per chart.)  Prior Inpatient Therapy Prior Inpatient Therapy: Yes(Per chart.) Prior Therapy Dates: Per chart, unknown. Prior Therapy Facilty/Provider(s): UTA Reason for Treatment: Per chart, substance use.   Prior Outpatient Therapy Prior Outpatient Therapy: No Does patient have an ACCT team?: No Does patient have Intensive In-House Services?  : No Does patient have [name] services? : No Does patient have P[facility] services?: No  ADL Screening (condition at time of admission) Patient's cognitive ability adequate to safely complete daily activities?: Yes(Per chart.) Is the patient deaf or have difficulty hearing?: No(Per chart.) Does the patient have difficulty seeing, even when wearing glasses/contacts?: No(Per chart.) Does the patient have difficulty concentrating, remembering, or making decisions?: Yes(Per chart.) Patient able to express need for assistance with ADLs?: Yes(Per chart.) Does the patient have difficulty dressing or bathing?: No(Per chart.) Independently performs ADLs?: Yes (appropriate for developmental age)(Per chart.) Does the patient have difficulty walking or climbing stairs?: Yes(Per chart.) Weakness of Legs: None(Per chart.) Weakness of Arms/Hands: None(Per chart.)  Home Assistive Devices/Equipment Home Assistive Devices/Equipment: None(Per chart.)      Abuse/Neglect Assessment (Assessment to  be complete while patient is alone) Abuse/Neglect Assessment Can Be Completed: Unable to assess, patient is non-responsive or altered mental status     Advance Directives (For  Healthcare) Does Patient Have a Medical Advance Directive?: No Would patient like information on creating a medical advance directive?: No - Patient declined    Additional Information 1:1 In Past 12 Months?: No CIRT Risk: No Elopement Risk: No Does patient have medical clearance?: Yes     Disposition: Nira Conn, NP recommends overnight observation for safety and stabilization. Disposition discussed with Ken-Tyler, PA and Rashell, RN.    Disposition Initial Assessment Completed for this Encounter: Yes Disposition of Patient: (overnight observation for safety and stabilization.) Mode of transportation if patient is discharged?: N/A  On Site Evaluation by: Redmond Pulling, MS, LPC, CRC. Reviewed with Physician: Jean Rosenthal, PA and Nira Conn, NP.   Redmond Pulling 12/12/2017 6:07 AM   Redmond Pulling, MS, [facility], CRC Triage Specialist [phone]

## 2017-12-12 NOTE — BH Assessment (Signed)
[facility]  Per Juanetta BeetsJacqueline Norman, DO, this pt does not require psychiatric hospitalization at this time.  [facility].  This has been included in pt's discharge instructions.  Pt's nurse, Aram BeechamCynthia, has been notified.  Doylene Canninghomas Kevia Zaucha, MA Triage Specialist [phone]

## 2017-12-12 NOTE — ED Provider Notes (Signed)
[facility]   CSN: [id number] Arrival date & time: 12/12/17  0152     History   Chief Complaint Chief Complaint  Patient presents with  . Suicidal    HPI [name] is a 57 y.o. male.  HPI 57 year old African-American male past medical history significant for homelessness, schizophrenia, neuropathy, chronic foot pain, prior suicide ideations presents to the ED for evaluation of suicidal ideations and bilateral foot pain.  Patient is well-known to the ED with 27 visits in the past 6 months.  He has been seen several times for suicidal ideations.  Patient is complaining of visual hallucinations.  He has had thoughts of stepping in front of a car.  He feels he is a danger to himself and requesting help.  Reports cocaine use.  Patient reports bilateral chronic foot pain that has been ongoing.  No new injuries.  Denies fevers, swelling, vomiting, chest pain, shortness breath, lightheadedness, dizziness.  She requesting food. Past Medical History:  Diagnosis Date  . CHF (congestive heart failure) (HCC)   . Chronic foot pain   . Cocaine abuse (HCC)   . Diabetes mellitus without complication (HCC)   . Hepatitis C   . Homelessness   . Homelessness   . Hypertension   . Neuropathy   . Polysubstance abuse (HCC)   . Schizophrenia [facility](HCC)     Patient Active Problem List   Diagnosis Date Noted  . Cocaine abuse with cocaine-induced mood disorder (HCC) 09/18/2017  . Chronic foot pain   . Schizoaffective disorder, bipolar type (HCC) 09/30/2016  . Suicidal thoughts   . Substance induced mood disorder (HCC) 03/13/2015  . Acute kidney failure (HCC) 01/26/2015  . Schizophrenia, paranoid type (HCC) 01/17/2015  . Suicidal ideation   . Drug hallucinosis (HCC) 10/08/2014  . Chronic paranoid schizophrenia (HCC) 09/07/2014  . 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  Acute CHF (congestive heart failure) (HCC) 07/29/2014  . Cocaine use disorder, severe, dependence (HCC)   . Cocaine abuse (HCC) 03/10/2014  . Essential hypertension, benign 03/28/2013  . Diabetes mellitus (HCC) 03/15/2013    History reviewed. No pertinent surgical history.      Home Medications    Prior to Admission medications   Medication Sig Start Date End Date Taking? Authorizing Provider  ARIPiprazole (ABILIFY) 5 MG tablet Take 5 mg by mouth daily.   Yes [provider]  benztropine (COGENTIN) 1 MG tablet Take 1 mg by mouth daily.   Yes [provider]  carbamazepine (TEGRETOL) 100 MG chewable tablet Chew 100 mg by mouth daily.   Yes [provider]  hydrOXYzine (ATARAX/VISTARIL) 50 MG tablet Take 50 mg by mouth 2 (two) times daily.   Yes [provider]  acetaminophen (TYLENOL) 500 MG tablet Take 1 tablet (500 mg total) by mouth every 6 (six) hours as needed. Patient not taking: Reported on 12/10/2017 12/07/17   McDonald, Mia A, PA-C  Cetirizine HCl (ZYRTEC ALLERGY) 10 MG CAPS Take 1 capsule (10 mg total) by mouth daily. Patient not taking: Reported on 11/23/2017 10/03/17   Ward, Layla MawKristen N, DO  fluticasone (FLONASE) 50 MCG/ACT nasal spray Place 2 sprays into both nostrils daily. Patient not taking: Reported on 11/23/2017 10/03/17   Ward, Layla MawKristen N, DO  hydrochlorothiazide (HYDRODIURIL) 25 MG tablet Take 1 tablet (25 mg total) by mouth daily. Patient not taking: Reported on 11/23/2017 10/13/17   Ward, Layla MawKristen N, DO  lisinopril (PRINIVIL,ZESTRIL)  40 MG tablet Take 1 tablet (40 mg total) by mouth daily. Patient not taking: Reported on 11/23/2017 10/13/17   Ward, Layla Maw, DO    Family History Family History  Problem Relation Age of Onset  . Hypertension Other   . Diabetes Other     Social History Social History   Tobacco Use  . Smoking status: Current Every Day Smoker    Packs/day: 1.00    Years: 20.00    Pack years: 20.00    Types: Cigarettes   . Smokeless tobacco: Current User  Substance Use Topics  . Alcohol use: Yes    Comment: Daily Drinker   . Drug use: Yes    Frequency: 7.0 times per week    Types: "Crack" cocaine, Cocaine, Marijuana    Comment: Cocaine tonight, Marijuana "a long time"     Allergies   Haldol [haloperidol]   Review of Systems Review of Systems  All other systems reviewed and are negative.    Physical Exam Updated Vital Signs BP (!) 151/94 (BP Location: Right Arm)   Pulse 86   Temp 97.8 F (36.6 C) (Oral)   Resp 16   SpO2 98%   Physical Exam  Constitutional: He appears well-developed and well-nourished. No distress.  HENT:  Head: Normocephalic and atraumatic.  Eyes: Right eye exhibits no discharge. Left eye exhibits no discharge. No scleral icterus.  Neck: Normal range of motion.  Pulmonary/Chest: No respiratory distress.  Musculoskeletal: Normal range of motion.  Bilateral feet without any edema, erythema, warmth.  Full range motion of all joints.  No tenderness to palpation.  No calf tenderness or swelling.  DP pulses are 2+ bilaterally.  Sensation intact.  Brisk cap refill.  Strength normal.  Neurological: He is alert.  Skin: No pallor.  Psychiatric:  Psychiatric: His affect is labile. His speech is rapid and/or pressured. He is actively hallucinating. He expresses suicidal ideation.   Nursing note and vitals reviewed.    ED Treatments / Results  Labs (all labs ordered are listed, but only abnormal results are displayed) Labs Reviewed  COMPREHENSIVE METABOLIC PANEL - Abnormal; Notable for the following components:      Result Value   Glucose, Bld 186 (*)    ALT 11 (*)    All other components within normal limits  ACETAMINOPHEN LEVEL - Abnormal; Notable for the following components:   Acetaminophen (Tylenol), Serum <10 (*)    All other components within normal limits  CBC - Abnormal; Notable for the following components:   Hemoglobin 12.4 (*)    HCT 38.3 (*)    All other  components within normal limits  ETHANOL  SALICYLATE LEVEL  RAPID URINE DRUG SCREEN, HOSP PERFORMED    EKG None  Radiology No results found.  Procedures Procedures (including critical care time)  Medications Ordered in ED Medications - No data to display   Initial Impression / Assessment and Plan / ED Course  I have reviewed the triage vital signs and the nursing notes.  Pertinent labs & imaging results that were available during my care of the patient were reviewed by me and considered in my medical decision making (see chart for details).     Patient well known to ED with long history substance abuse and schizophrenia. He is here with c/o visual hallucinations and SI.  Reports chronic bilateral feet pain.  This is chronic.  No signs of cellulitis, septic arthritis, gout.  Lab work reassuring.  Patient can be medically cleared.  TTS consultation  to determine disposition.    Final Clinical Impressions(s) / ED Diagnoses   Final diagnoses:  Suicidal ideation  Bilateral foot pain    ED Discharge Orders    None       Wallace Keller 12/12/17 0548    Molpus, Jonny Ruiz, MD 12/12/17 [phone]

## 2017-12-12 NOTE — ED Notes (Signed)
Patient reports that he sees demons that are trying to kill him. Plan of care discussed. Encouragement and support provided and safety maintain. Q 15 min safety checks in place and video monitoring.

## 2017-12-12 NOTE — ED Notes (Signed)
Bed: WTR5 Expected date:  Expected time:  Means of arrival:  Comments: 

## 2017-12-14 ENCOUNTER — Other Ambulatory Visit: Payer: Self-pay

## 2017-12-14 ENCOUNTER — Emergency Department (HOSPITAL_COMMUNITY)
Admission: EM | Admit: 2017-12-14 | Discharge: 2017-12-14 | Disposition: A | Discharge status: Home / Self Care | Payer: Medicaid Other | Attending: Emergency Medicine | Admitting: Emergency Medicine

## 2017-12-14 ENCOUNTER — Encounter (HOSPITAL_COMMUNITY): Payer: Self-pay | Admitting: Emergency Medicine

## 2017-12-14 DIAGNOSIS — Z79899 Other long term (current) drug therapy: Secondary | ICD-10-CM | POA: Insufficient documentation

## 2017-12-14 DIAGNOSIS — Z5900 Homelessness unspecified: Secondary | ICD-10-CM

## 2017-12-14 DIAGNOSIS — I509 Heart failure, unspecified: Secondary | ICD-10-CM | POA: Insufficient documentation

## 2017-12-14 DIAGNOSIS — F17210 Nicotine dependence, cigarettes, uncomplicated: Secondary | ICD-10-CM | POA: Insufficient documentation

## 2017-12-14 DIAGNOSIS — I110 Hypertensive heart disease with heart failure: Secondary | ICD-10-CM | POA: Insufficient documentation

## 2017-12-14 DIAGNOSIS — E119 Type 2 diabetes mellitus without complications: Secondary | ICD-10-CM | POA: Diagnosis not present

## 2017-12-14 MED ORDER — ACETAMINOPHEN 325 MG PO TABS
650.0000 mg | ORAL_TABLET | Freq: Once | ORAL | Status: AC
  Administered 2017-12-14: 650 mg via ORAL
  Filled 2017-12-14: qty 2

## 2017-12-14 NOTE — ED Notes (Signed)
Spoke with patient regarding discharge. Pt requested more milk and crackers and was given these. Pt is finishing his sandwich.

## 2017-12-14 NOTE — ED Triage Notes (Signed)
Pt states he hasn't been feeling well. Initially states shob but then asked this RN for some tylenol because his toe is bothering him again.

## 2017-12-14 NOTE — ED Provider Notes (Signed)
[facility] EMERGENCY DEPARTMENT Provider Note   CSN: [id number] Arrival date & time: 12/14/17  0003     History   Chief Complaint Chief Complaint  Patient presents with  . Toe Pain    HPI [name] is a 57 y.o. male.  Patient presents to the emergency department with chief complaint of homelessness.  Patient originally was complaining of toe pain, but upon my examination and history, patient states that he wanted to have his oxygen checked.  However, patient denies any cough or chest pain.  He denies any other associated symptoms.  He states that his toe feels fine after having had some Tylenol.  He also ask for a bus pass.  The history is provided by the patient. No language interpreter was used.    Past Medical History:  Diagnosis Date  . CHF (congestive heart failure) (HCC)   . Chronic foot pain   . Cocaine abuse (HCC)   . Diabetes mellitus without complication (HCC)   . Hepatitis C   . Homelessness   . Homelessness   . Hypertension   . Neuropathy   . Polysubstance abuse (HCC)   . Schizophrenia [facility])     Patient Active Problem List   Diagnosis Date Noted  . Cocaine abuse with cocaine-induced mood disorder (HCC) 09/18/2017  . Chronic foot pain   . Schizoaffective disorder, bipolar type (HCC) 09/30/2016  . Suicidal thoughts   . Substance induced mood disorder (HCC) 03/13/2015  . Acute kidney failure (HCC) 01/26/2015  . Schizophrenia, paranoid type (HCC) 01/17/2015  . Suicidal ideation   . Drug hallucinosis (HCC) 10/08/2014  . Chronic paranoid schizophrenia (HCC) 09/07/2014  . 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 Acute CHF (congestive heart failure) (HCC) 07/29/2014  . Cocaine use disorder, severe, dependence (HCC)   . Cocaine abuse (HCC) 03/10/2014  . Essential hypertension, benign 03/28/2013  . Diabetes mellitus (HCC) 03/15/2013    History reviewed. No  pertinent surgical history.      Home Medications    Prior to Admission medications   Medication Sig Start Date End Date Taking? Authorizing Provider  acetaminophen (TYLENOL) 500 MG tablet Take 1 tablet (500 mg total) by mouth every 6 (six) hours as needed. Patient not taking: Reported on 12/10/2017 12/07/17   McDonald, Mia A, PA-C  ARIPiprazole (ABILIFY) 5 MG tablet Take 5 mg by mouth daily.    [provider]  benztropine (COGENTIN) 1 MG tablet Take 1 mg by mouth daily.    [provider]  carbamazepine (TEGRETOL) 100 MG chewable tablet Chew 100 mg by mouth daily.    [provider]  Cetirizine HCl (ZYRTEC ALLERGY) 10 MG CAPS Take 1 capsule (10 mg total) by mouth daily. Patient not taking: Reported on 11/23/2017 10/03/17   Ward, Layla Maw, DO  fluticasone (FLONASE) 50 MCG/ACT nasal spray Place 2 sprays into both nostrils daily. Patient not taking: Reported on 11/23/2017 10/03/17   Ward, Layla Maw, DO  hydrochlorothiazide (HYDRODIURIL) 25 MG tablet Take 1 tablet (25 mg total) by mouth daily. Patient not taking: Reported on 11/23/2017 10/13/17   Ward, Layla Maw, DO  hydrOXYzine (ATARAX/VISTARIL) 50 MG tablet Take 50 mg by mouth 2 (two) times daily.    [provider]  lisinopril (PRINIVIL,ZESTRIL) 40 MG tablet Take 1 tablet (40 mg total) by mouth daily. Patient not taking: Reported on 11/23/2017 10/13/17   Ward, Layla Maw, DO    Family History Family  History  Problem Relation Age of Onset  . Hypertension Other   . Diabetes Other     Social History Social History   Tobacco Use  . Smoking status: Current Every Day Smoker    Packs/day: 1.00    Years: 20.00    Pack years: 20.00    Types: Cigarettes  . Smokeless tobacco: Current User  Substance Use Topics  . Alcohol use: Yes    Comment: Daily Drinker   . Drug use: Yes    Frequency: 7.0 times per week    Types: "Crack" cocaine, Cocaine, Marijuana    Comment: Cocaine tonight, Marijuana "a long time"      Allergies   Haldol [haloperidol]   Review of Systems Review of Systems  All other systems reviewed and are negative.    Physical Exam Updated Vital Signs BP (!) 158/103 (BP Location: Right Arm)   Pulse 71   Temp 98.7 F (37.1 C) (Oral)   Resp 16   SpO2 100%   Physical Exam  Constitutional: He is oriented to person, place, and time. He appears well-developed and well-nourished.  HENT:  Head: Normocephalic and atraumatic.  Eyes: Pupils are equal, round, and reactive to light. Conjunctivae and EOM are normal. Right eye exhibits no discharge. Left eye exhibits no discharge. No scleral icterus.  Neck: Normal range of motion. Neck supple. No JVD present.  Cardiovascular: Normal rate, regular rhythm and normal heart sounds. Exam reveals no gallop and no friction rub.  No murmur heard. Pulmonary/Chest: Effort normal and breath sounds normal. No respiratory distress. He has no wheezes. He has no rales. He exhibits no tenderness.  Abdominal: Soft. He exhibits no distension and no mass. There is no tenderness. There is no rebound and no guarding.  Musculoskeletal: Normal range of motion. He exhibits no edema or tenderness.  Neurological: He is alert and oriented to person, place, and time.  Skin: Skin is warm and dry.  Psychiatric: He has a normal mood and affect. His behavior is normal. Judgment and thought content normal.  Nursing note and vitals reviewed.    ED Treatments / Results  Labs (all labs ordered are listed, but only abnormal results are displayed) Labs Reviewed - No data to display  EKG None  Radiology No results found.  Procedures Procedures (including critical care time)  Medications Ordered in ED Medications  acetaminophen (TYLENOL) tablet 650 mg (650 mg Oral Given 12/14/17 0040)     Initial Impression / Assessment and Plan / ED Course  I have reviewed the triage vital signs and the nursing notes.  Pertinent labs & imaging results that were  available during my care of the patient were reviewed by me and considered in my medical decision making (see chart for details).     Patient is a homeless male, who is well-known to this emergency department, he has had 28 visits in the past 6 months.  He is in no acute distress.  His lungs clear to auscultation.  He initially complained of toe pain, but then wanted to have his oxygen checked.  His vital signs are stable.  His O2 saturation is 100% on room air.  He denies any cough or chest pain.  He is afebrile.  Upon telling him that everything looked okay, patient states, "Okay, I guess I am ready to go.  Can I have a bus pass and something to eat."  Final Clinical Impressions(s) / ED Diagnoses   Final diagnoses:  Homelessness    ED Discharge  Orders    None       Roxy HorsemanBrowning, Latorsha Curling, PA-C 12/14/17 0441    Gilda CreasePollina, Christopher J, MD 12/14/17 [phone]

## 2017-12-14 NOTE — ED Notes (Signed)
Writer called for triage room X1

## 2017-12-14 NOTE — ED Notes (Signed)
Pt given multiple graham crackers and peanut butter, warmed Malawiturkey sandwich, juice per request.

## 2017-12-16 ENCOUNTER — Other Ambulatory Visit: Payer: Self-pay

## 2017-12-16 ENCOUNTER — Emergency Department (HOSPITAL_COMMUNITY): Payer: Medicaid Other

## 2017-12-16 ENCOUNTER — Encounter (HOSPITAL_COMMUNITY): Payer: Self-pay | Admitting: *Deleted

## 2017-12-16 ENCOUNTER — Emergency Department (HOSPITAL_COMMUNITY)
Admission: EM | Admit: 2017-12-16 | Discharge: 2017-12-17 | Disposition: A | Discharge status: Home / Self Care | Payer: Medicaid Other | Attending: Emergency Medicine | Admitting: Emergency Medicine

## 2017-12-16 DIAGNOSIS — E119 Type 2 diabetes mellitus without complications: Secondary | ICD-10-CM | POA: Insufficient documentation

## 2017-12-16 DIAGNOSIS — M79673 Pain in unspecified foot: Secondary | ICD-10-CM | POA: Diagnosis present

## 2017-12-16 DIAGNOSIS — F17210 Nicotine dependence, cigarettes, uncomplicated: Secondary | ICD-10-CM | POA: Insufficient documentation

## 2017-12-16 DIAGNOSIS — Z5900 Homelessness unspecified: Secondary | ICD-10-CM

## 2017-12-16 DIAGNOSIS — G8929 Other chronic pain: Secondary | ICD-10-CM | POA: Diagnosis not present

## 2017-12-16 DIAGNOSIS — F1410 Cocaine abuse, uncomplicated: Secondary | ICD-10-CM | POA: Insufficient documentation

## 2017-12-16 DIAGNOSIS — M79672 Pain in left foot: Secondary | ICD-10-CM | POA: Diagnosis not present

## 2017-12-16 DIAGNOSIS — I110 Hypertensive heart disease with heart failure: Secondary | ICD-10-CM | POA: Diagnosis not present

## 2017-12-16 DIAGNOSIS — M79671 Pain in right foot: Secondary | ICD-10-CM | POA: Insufficient documentation

## 2017-12-16 DIAGNOSIS — Z79899 Other long term (current) drug therapy: Secondary | ICD-10-CM | POA: Insufficient documentation

## 2017-12-16 DIAGNOSIS — I509 Heart failure, unspecified: Secondary | ICD-10-CM | POA: Diagnosis not present

## 2017-12-16 LAB — CBC
HCT: 38 % — ABNORMAL LOW (ref 39.0–52.0)
HEMOGLOBIN: 11.5 g/dL — AB (ref 13.0–17.0)
MCH: 28.7 pg (ref 26.0–34.0)
MCHC: 30.3 g/dL (ref 30.0–36.0)
MCV: 94.8 fL (ref 78.0–100.0)
Platelets: 191 10*3/uL (ref 150–400)
RBC: 4.01 MIL/uL — AB (ref 4.22–5.81)
RDW: 14.2 % (ref 11.5–15.5)
WBC: 6.8 10*3/uL (ref 4.0–10.5)

## 2017-12-16 LAB — I-STAT TROPONIN, ED: Troponin i, poc: 0.03 ng/mL (ref 0.00–0.08)

## 2017-12-16 NOTE — ED Triage Notes (Signed)
To ED for eval of CP after 'smoking crack' and bilateral feet pain. States he has been incontinent of bowel and bladder. Skin warm and dry. Pt is currently cooperative

## 2017-12-17 LAB — BASIC METABOLIC PANEL
Anion gap: 10 (ref 5–15)
BUN: 13 mg/dL (ref 6–20)
CALCIUM: 9.3 mg/dL (ref 8.9–10.3)
CO2: 26 mmol/L (ref 22–32)
CREATININE: 0.95 mg/dL (ref 0.61–1.24)
Chloride: 106 mmol/L (ref 101–111)
GFR calc non Af Amer: >60 mL/min (ref >60)
Glucose, Bld: 179 mg/dL — ABNORMAL HIGH (ref 65–99)
Potassium: 3.4 mmol/L — ABNORMAL LOW (ref 3.5–5.1)
Sodium: 142 mmol/L (ref 135–145)

## 2017-12-17 MED ORDER — CLOTRIMAZOLE 1 % EX CREA: TOPICAL_CREAM | CUTANEOUS | 0 refills | Status: DC

## 2017-12-17 NOTE — ED Notes (Signed)
ED Provider at bedside. 

## 2017-12-17 NOTE — ED Provider Notes (Signed)
[facility] Provider Note   CSN: [id number] Arrival date & time: 12/16/17  2301     History   Chief Complaint Chief Complaint  Patient presents with  . Chest Pain  . Foot Pain    HPI [name] is a 57 y.o. male.  Patient with past medical history remarkable for homelessness, cocaine abuse, chronic foot pain presents to the emergency department with a chief complaint of foot pain.  He states that he does not want any trouble, and that he recently got released from prison.  He states that he is a drug addict.  States that he wants something for his foot pain.  Denies any fever.  He has not taken anything for symptoms.  The history is provided by the patient. No language interpreter was used.    Past Medical History:  Diagnosis Date  . CHF (congestive heart failure) (HCC)   . Chronic foot pain   . Cocaine abuse (HCC)   . Diabetes mellitus without complication (HCC)   . Hepatitis C   . Homelessness   . Homelessness   . Hypertension   . Neuropathy   . Polysubstance abuse (HCC)   . Schizophrenia [facility](HCC)     Patient Active Problem List   Diagnosis Date Noted  . Cocaine abuse with cocaine-induced mood disorder (HCC) 09/18/2017  . Chronic foot pain   . Schizoaffective disorder, bipolar type (HCC) 09/30/2016  . Suicidal thoughts   . Substance induced mood disorder (HCC) 03/13/2015  . Acute kidney failure (HCC) 01/26/2015  . Schizophrenia, paranoid type (HCC) 01/17/2015  . Suicidal ideation   . Drug hallucinosis (HCC) 10/08/2014  . Chronic paranoid schizophrenia (HCC) 09/07/2014  . 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 Acute CHF (congestive heart failure) (HCC) 07/29/2014  . Cocaine use disorder, severe, dependence (HCC)   . Cocaine abuse (HCC) 03/10/2014  . Essential hypertension, benign 03/28/2013  . Diabetes mellitus (HCC) 03/15/2013    History  reviewed. No pertinent surgical history.      Home Medications    Prior to Admission medications   Medication Sig Start Date End Date Taking? Authorizing Provider  acetaminophen (TYLENOL) 500 MG tablet Take 1 tablet (500 mg total) by mouth every 6 (six) hours as needed. Patient not taking: Reported on 12/10/2017 12/07/17   McDonald, Mia A, PA-C  ARIPiprazole (ABILIFY) 5 MG tablet Take 5 mg by mouth daily.    [provider]  benztropine (COGENTIN) 1 MG tablet Take 1 mg by mouth daily.    [provider]  carbamazepine (TEGRETOL) 100 MG chewable tablet Chew 100 mg by mouth daily.    [provider]  Cetirizine HCl (ZYRTEC ALLERGY) 10 MG CAPS Take 1 capsule (10 mg total) by mouth daily. Patient not taking: Reported on 11/23/2017 10/03/17   Ward, Layla MawKristen N, DO  clotrimazole (LOTRIMIN) 1 % cream Apply to affected area 2 times daily 12/17/17   Roxy HorsemanBrowning, Shoshana Johal, PA-C  fluticasone [facility](FLONASE) 50 MCG/ACT nasal spray Place 2 sprays into both nostrils daily. Patient not taking: Reported on 11/23/2017 10/03/17   Ward, Layla MawKristen N, DO  hydrochlorothiazide (HYDRODIURIL) 25 MG tablet Take 1 tablet (25 mg total) by mouth daily. Patient not taking: Reported on 11/23/2017 10/13/17   Ward, Layla MawKristen N, DO  hydrOXYzine (ATARAX/VISTARIL) 50 MG tablet Take 50 mg by mouth 2 (two) times daily.    [provider]  lisinopril (PRINIVIL,ZESTRIL) 40 MG tablet Take 1 tablet (  40 mg total) by mouth daily. Patient not taking: Reported on 11/23/2017 10/13/17   Ward, Layla Maw, DO    Family History Family History  Problem Relation Age of Onset  . Hypertension Other   . Diabetes Other     Social History Social History   Tobacco Use  . Smoking status: Current Every Day Smoker    Packs/day: 1.00    Years: 20.00    Pack years: 20.00    Types: Cigarettes  . Smokeless tobacco: Current User  Substance Use Topics  . Alcohol use: Yes    Comment: Daily Drinker   . Drug use: Yes    Frequency: 7.0  times per week    Types: "Crack" cocaine, Cocaine, Marijuana    Comment: Cocaine tonight, Marijuana "a long time"     Allergies   Haldol [haloperidol]   Review of Systems Review of Systems  All other systems reviewed and are negative.    Physical Exam Updated Vital Signs BP (!) 172/87   Pulse 94   Temp 99 F (37.2 C) (Oral)   Resp 20   SpO2 96%   Physical Exam  Constitutional: He is oriented to person, place, and time. He appears well-developed and well-nourished.  HENT:  Head: Normocephalic and atraumatic.  Eyes: Pupils are equal, round, and reactive to light. Conjunctivae and EOM are normal. Right eye exhibits no discharge. Left eye exhibits no discharge. No scleral icterus.  Neck: Normal range of motion. Neck supple. No JVD present.  Cardiovascular: Normal rate, regular rhythm and normal heart sounds. Exam reveals no gallop and no friction rub.  No murmur heard. Pulmonary/Chest: Effort normal and breath sounds normal. No respiratory distress. He has no wheezes. He has no rales. He exhibits no tenderness.  Abdominal: Soft. He exhibits no distension and no mass. There is no tenderness. There is no rebound and no guarding.  Musculoskeletal: Normal range of motion. He exhibits no edema or tenderness.  Neurological: He is alert and oriented to person, place, and time.  Skin: Skin is warm and dry.  Peeling skin of bilateral feet  Psychiatric: He has a normal mood and affect. His behavior is normal. Judgment and thought content normal.  Nursing note and vitals reviewed.    ED Treatments / Results  Labs (all labs ordered are listed, but only abnormal results are displayed) Labs Reviewed  BASIC METABOLIC PANEL - Abnormal; Notable for the following components:      Result Value   Potassium 3.4 (*)    Glucose, Bld 179 (*)    All other components within normal limits  CBC - Abnormal; Notable for the following components:   RBC 4.01 (*)    Hemoglobin 11.5 (*)    HCT 38.0  (*)    All other components within normal limits  I-STAT TROPONIN, ED    EKG EKG Interpretation  Date/Time:  Tuesday December 16 2017 23:16:45 EDT Ventricular Rate:  90 PR Interval:  138 QRS Duration: 82 QT Interval:  378 QTC Calculation: 462 R Axis:   8 Text Interpretation:  Sinus rhythm with Premature atrial complexes in a pattern of bigeminy Left ventricular hypertrophy T wave abnormality, consider lateral ischemia Prolonged QT Abnormal ECG abnormal ekg, but similar in appearnace, worsening T waves in lateral leads.  Confirmed by Bary Castilla ([id number]) on 12/16/2017 11:18:59 PM   Radiology Dg Chest 2 View  Result Date: 12/17/2017 CLINICAL DATA:  Chest pain and bilateral foot pain after smoking crack. Incontinent of bowel and bladder. EXAM:  CHEST - 2 VIEW COMPARISON:  10/03/2017 FINDINGS: Heart size and pulmonary vascularity are normal. Metallic pellet in the anterior chest projected over the right costophrenic angle. No airspace disease or consolidation. No blunting of costophrenic angles. No pneumothorax. Calcified and tortuous aorta. Degenerative changes in the spine. Degenerative changes in the shoulders. No significant change since previous study. IMPRESSION: No active cardiopulmonary disease.  Aortic atherosclerosis. Electronically Signed   By: Burman Nieves M.D.   On: 12/17/2017 00:09    Procedures Procedures (including critical care time)  Medications Ordered in ED Medications - No data to display   Initial Impression / Assessment and Plan / ED Course  I have reviewed the triage vital signs and the nursing notes.  Pertinent labs & imaging results that were available during my care of the patient were reviewed by me and considered in my medical decision making (see chart for details).     Patient with homelessness and chronic complaints.  Laboratory work-up is reassuring.  Will prescribe Lotrimin for possible tinea pedis and discharge.  Final Clinical Impressions(s)  / ED Diagnoses   Final diagnoses:  Homeless    ED Discharge Orders        Ordered    clotrimazole (LOTRIMIN) 1 % cream     12/17/17 0223       Roxy Horseman, PA-C 12/17/17 0354    Shaune Pollack, MD 12/17/17 1032

## 2017-12-17 NOTE — ED Notes (Signed)
Pt alert, no family Present VS documented

## 2017-12-17 NOTE — ED Notes (Signed)
Pt requested socks and given the same. Pt also ask for a Malawiturkey sandwich and 2 milks. OK per RN Melissa pt given a Malawiturkey sandwich, apple sauce, graham crackers, peanut butter and 2 milks.

## 2017-12-17 NOTE — ED Notes (Signed)
Patient verbalizes understanding of discharge instructions. Opportunity for questioning and answers were provided. Armband removed by staff, pt discharged from ED. Pt escorted out of ED.

## 2017-12-18 ENCOUNTER — Other Ambulatory Visit: Payer: Self-pay

## 2017-12-18 ENCOUNTER — Encounter (HOSPITAL_COMMUNITY): Payer: Self-pay | Admitting: Registered Nurse

## 2017-12-18 ENCOUNTER — Emergency Department (HOSPITAL_COMMUNITY)
Admission: EM | Admit: 2017-12-18 | Discharge: 2017-12-18 | Disposition: A | Discharge status: Home / Self Care | Payer: Medicaid Other | Attending: Emergency Medicine | Admitting: Emergency Medicine

## 2017-12-18 DIAGNOSIS — F209 Schizophrenia, unspecified: Secondary | ICD-10-CM | POA: Insufficient documentation

## 2017-12-18 DIAGNOSIS — R443 Hallucinations, unspecified: Secondary | ICD-10-CM | POA: Diagnosis present

## 2017-12-18 DIAGNOSIS — I509 Heart failure, unspecified: Secondary | ICD-10-CM | POA: Diagnosis not present

## 2017-12-18 DIAGNOSIS — F1910 Other psychoactive substance abuse, uncomplicated: Secondary | ICD-10-CM | POA: Insufficient documentation

## 2017-12-18 DIAGNOSIS — E119 Type 2 diabetes mellitus without complications: Secondary | ICD-10-CM | POA: Diagnosis not present

## 2017-12-18 DIAGNOSIS — Z79899 Other long term (current) drug therapy: Secondary | ICD-10-CM | POA: Diagnosis not present

## 2017-12-18 DIAGNOSIS — R45851 Suicidal ideations: Secondary | ICD-10-CM | POA: Insufficient documentation

## 2017-12-18 DIAGNOSIS — F1994 Other psychoactive substance use, unspecified with psychoactive substance-induced mood disorder: Secondary | ICD-10-CM

## 2017-12-18 DIAGNOSIS — F17210 Nicotine dependence, cigarettes, uncomplicated: Secondary | ICD-10-CM | POA: Insufficient documentation

## 2017-12-18 DIAGNOSIS — I110 Hypertensive heart disease with heart failure: Secondary | ICD-10-CM | POA: Diagnosis not present

## 2017-12-18 LAB — RAPID URINE DRUG SCREEN, HOSP PERFORMED
AMPHETAMINES: NOT DETECTED
BENZODIAZEPINES: NOT DETECTED
COCAINE: POSITIVE — AB
Opiates: NOT DETECTED
TETRAHYDROCANNABINOL: NOT DETECTED

## 2017-12-18 LAB — COMPREHENSIVE METABOLIC PANEL
ALBUMIN: 3.3 g/dL — AB (ref 3.5–5.0)
ALT: 12 U/L — ABNORMAL LOW (ref 17–63)
ANION GAP: 9 (ref 5–15)
AST: 23 U/L (ref 15–41)
Alkaline Phosphatase: 81 U/L (ref 38–126)
BUN: 14 mg/dL (ref 6–20)
CO2: 23 mmol/L (ref 22–32)
Calcium: 9.2 mg/dL (ref 8.9–10.3)
Chloride: 111 mmol/L (ref 101–111)
Creatinine, Ser: 1.05 mg/dL (ref 0.61–1.24)
GFR calc Af Amer: >60 mL/min (ref >60)
GFR calc non Af Amer: >60 mL/min (ref >60)
GLUCOSE: 154 mg/dL — AB (ref 65–99)
POTASSIUM: 3.9 mmol/L (ref 3.5–5.1)
SODIUM: 143 mmol/L (ref 135–145)
Total Bilirubin: 0.7 mg/dL (ref 0.3–1.2)
Total Protein: 6.2 g/dL — ABNORMAL LOW (ref 6.5–8.1)

## 2017-12-18 LAB — CBC
HCT: 35.7 % — ABNORMAL LOW (ref 39.0–52.0)
HEMOGLOBIN: 11 g/dL — AB (ref 13.0–17.0)
MCH: 28.9 pg (ref 26.0–34.0)
MCHC: 30.8 g/dL (ref 30.0–36.0)
MCV: 93.7 fL (ref 78.0–100.0)
Platelets: 177 10*3/uL (ref 150–400)
RBC: 3.81 MIL/uL — ABNORMAL LOW (ref 4.22–5.81)
RDW: 14.1 % (ref 11.5–15.5)
WBC: 5.9 10*3/uL (ref 4.0–10.5)

## 2017-12-18 LAB — ETHANOL: Alcohol, Ethyl (B): <10 mg/dL (ref <10)

## 2017-12-18 LAB — SALICYLATE LEVEL: Salicylate Lvl: <7 mg/dL (ref 2.8–30.0)

## 2017-12-18 LAB — ACETAMINOPHEN LEVEL

## 2017-12-18 MED ORDER — HYDROCHLOROTHIAZIDE 25 MG PO TABS
25.0000 mg | ORAL_TABLET | Freq: Every day | ORAL | Status: DC
  Administered 2017-12-18: 25 mg via ORAL
  Filled 2017-12-18: qty 1

## 2017-12-18 MED ORDER — LISINOPRIL 20 MG PO TABS
40.0000 mg | ORAL_TABLET | Freq: Every day | ORAL | Status: DC
  Administered 2017-12-18: 40 mg via ORAL
  Filled 2017-12-18: qty 2

## 2017-12-18 NOTE — ED Provider Notes (Signed)
[facility] Provider Note   CSN: [id number] Arrival date & time: 12/18/17  0049     History   Chief Complaint Chief Complaint  Patient presents with  . Suicidal    HPI [name] is a 57 y.o. male.  Patient is a 57 year old male with a history of schizophrenia, polysubstance abuse, homelessness, diabetes, hepatitis C presenting today with suicidal ideation.  Patient states that there is a demon in his throat choking him and it makes him want to die.  He also states a dragon has come into his mouth and makes it hard for him to swallow.  He states he supposed to be on Tegretol and Abilify but he has not been taking those medications.  Patient denies homicidal ideation.  He states the demons talk to him but only say one word at a time.  The demons do not tell him to hurt himself.  Patient was seen yesterday for foot pain however he is denying any pain in his feet currently.  He denies any fever, shortness of breath, cough or chest pain.  Patient last used crack today.  He also drank alcohol today  The history is provided by the patient.    Past Medical History:  Diagnosis Date  . CHF (congestive heart failure) (HCC)   . Chronic foot pain   . Cocaine abuse (HCC)   . Diabetes mellitus without complication (HCC)   . Hepatitis C   . Homelessness   . Homelessness   . Hypertension   . Neuropathy   . Polysubstance abuse (HCC)   . Schizophrenia [facility](HCC)     Patient Active Problem List   Diagnosis Date Noted  . Cocaine abuse with cocaine-induced mood disorder (HCC) 09/18/2017  . Chronic foot pain   . Schizoaffective disorder, bipolar type (HCC) 09/30/2016  . Suicidal thoughts   . Substance induced mood disorder (HCC) 03/13/2015  . Acute kidney failure (HCC) 01/26/2015  . Schizophrenia, paranoid type (HCC) 01/17/2015  . Suicidal ideation   . Drug hallucinosis (HCC) 10/08/2014  . Chronic paranoid schizophrenia (HCC) 09/07/2014  .  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 Acute CHF (congestive heart failure) (HCC) 07/29/2014  . Cocaine use disorder, severe, dependence (HCC)   . Cocaine abuse (HCC) 03/10/2014  . Essential hypertension, benign 03/28/2013  . Diabetes mellitus (HCC) 03/15/2013    No past surgical history on file.      Home Medications    Prior to Admission medications   Medication Sig Start Date End Date Taking? Authorizing Provider  acetaminophen (TYLENOL) 500 MG tablet Take 1 tablet (500 mg total) by mouth every 6 (six) hours as needed. Patient not taking: Reported on 12/10/2017 12/07/17   McDonald, Mia A, PA-C  ARIPiprazole (ABILIFY) 5 MG tablet Take 5 mg by mouth daily.    [provider]  benztropine (COGENTIN) 1 MG tablet Take 1 mg by mouth daily.    [provider]  carbamazepine (TEGRETOL) 100 MG chewable tablet Chew 100 mg by mouth daily.    [provider]  Cetirizine HCl (ZYRTEC ALLERGY) 10 MG CAPS Take 1 capsule (10 mg total) by mouth daily. Patient not taking: Reported on 11/23/2017 10/03/17   Ward, Layla MawKristen N, DO  clotrimazole (LOTRIMIN) 1 % cream Apply to affected area 2 times daily 12/17/17   Roxy HorsemanBrowning, Robert, PA-C  fluticasone [facility](FLONASE) 50 MCG/ACT nasal spray Place 2 sprays into both nostrils daily. Patient not taking:  Reported on 11/23/2017 10/03/17   Ward, Layla Maw, DO  hydrochlorothiazide (HYDRODIURIL) 25 MG tablet Take 1 tablet (25 mg total) by mouth daily. Patient not taking: Reported on 11/23/2017 10/13/17   Ward, Layla Maw, DO  hydrOXYzine (ATARAX/VISTARIL) 50 MG tablet Take 50 mg by mouth 2 (two) times daily.    [provider]  lisinopril (PRINIVIL,ZESTRIL) 40 MG tablet Take 1 tablet (40 mg total) by mouth daily. Patient not taking: Reported on 11/23/2017 10/13/17   Ward, Layla Maw, DO    Family History Family History  Problem Relation Age of Onset  . Hypertension Other   .  Diabetes Other     Social History Social History   Tobacco Use  . Smoking status: Current Every Day Smoker    Packs/day: 1.00    Years: 20.00    Pack years: 20.00    Types: Cigarettes  . Smokeless tobacco: Current User  Substance Use Topics  . Alcohol use: Yes    Comment: Daily Drinker   . Drug use: Yes    Frequency: 7.0 times per week    Types: "Crack" cocaine, Cocaine, Marijuana    Comment: Cocaine tonight, Marijuana "a long time"     Allergies   Haldol [haloperidol]   Review of Systems Review of Systems  All other systems reviewed and are negative.    Physical Exam Updated Vital Signs BP (!) 163/101 (BP Location: Right Arm)   Pulse (!) 107   Temp 99.4 F (37.4 C) (Oral)   Resp 20   SpO2 97%   Physical Exam  Constitutional: He is oriented to person, place, and time. He appears well-developed and well-nourished. No distress.  HENT:  Head: Normocephalic and atraumatic.  Mouth/Throat: Oropharynx is clear and moist.  Eyes: Pupils are equal, round, and reactive to light. Conjunctivae and EOM are normal.  Neck: Normal range of motion. Neck supple.  Cardiovascular: Regular rhythm and intact distal pulses. Tachycardia present.  No murmur heard. Pulmonary/Chest: Effort normal and breath sounds normal. No respiratory distress. He has no wheezes. He has no rales.  Abdominal: Soft. He exhibits no distension. There is no tenderness. There is no rebound and no guarding.  Musculoskeletal: Normal range of motion. He exhibits no edema or tenderness.  Neurological: He is alert and oriented to person, place, and time. He has normal strength. No sensory deficit. Gait normal.  Skin: Skin is warm and dry. No rash noted. No erythema.  Psychiatric: His behavior is normal. His affect is labile. His speech is tangential. He is actively hallucinating. Thought content is paranoid. He expresses suicidal ideation. He expresses no homicidal ideation. He expresses no suicidal plans and no  homicidal plans.  Nursing note and vitals reviewed.    ED Treatments / Results  Labs (all labs ordered are listed, but only abnormal results are displayed) Labs Reviewed  COMPREHENSIVE METABOLIC PANEL - Abnormal; Notable for the following components:      Result Value   Glucose, Bld 154 (*)    Total Protein 6.2 (*)    Albumin 3.3 (*)    ALT 12 (*)    All other components within normal limits  ACETAMINOPHEN LEVEL - Abnormal; Notable for the following components:   Acetaminophen (Tylenol), Serum <10 (*)    All other components within normal limits  CBC - Abnormal; Notable for the following components:   RBC 3.81 (*)    Hemoglobin 11.0 (*)    HCT 35.7 (*)    All other components within normal limits  RAPID URINE DRUG SCREEN, HOSP PERFORMED - Abnormal; Notable for the following components:   Cocaine POSITIVE (*)    Barbiturates   (*)    Value: Result not available. Reagent lot number recalled by manufacturer.   All other components within normal limits  ETHANOL  SALICYLATE LEVEL    EKG None  Radiology Dg Chest 2 View  Result Date: 12/17/2017 CLINICAL DATA:  Chest pain and bilateral foot pain after smoking crack. Incontinent of bowel and bladder. EXAM: CHEST - 2 VIEW COMPARISON:  10/03/2017 FINDINGS: Heart size and pulmonary vascularity are normal. Metallic pellet in the anterior chest projected over the right costophrenic angle. No airspace disease or consolidation. No blunting of costophrenic angles. No pneumothorax. Calcified and tortuous aorta. Degenerative changes in the spine. Degenerative changes in the shoulders. No significant change since previous study. IMPRESSION: No active cardiopulmonary disease.  Aortic atherosclerosis. Electronically Signed   By: Burman Nieves M.D.   On: 12/17/2017 00:09    Procedures Procedures (including critical care time)  Medications Ordered in ED Medications - No data to display   Initial Impression / Assessment and Plan / ED  Course  I have reviewed the triage vital signs and the nursing notes.  Pertinent labs & imaging results that were available during my care of the patient were reviewed by me and considered in my medical decision making (see chart for details).     Patient presenting today with suicidal ideation.  Patient is well-known to the emergency room with multiple visits for various symptoms.  He does have a history of schizophrenia and is currently hallucinating stating edema and is choking him and he feels like he needs to see someone from behavioral health.  Patient has been using cocaine but his alcohol level is normal.  On exam he has no acute findings.  Labs are reassuring.  Feel that patient is medically clear and psych to evaluate.  Final Clinical Impressions(s) / ED Diagnoses   Final diagnoses:  Suicidal ideation  Polysubstance abuse [facility])  Hallucination    ED Discharge Orders    None       Gwyneth Sprout, MD 12/18/17 [id number]

## 2017-12-18 NOTE — Consult Note (Signed)
  Spoke with nurse who informed that patient is being discharged.  Stace Peace B. Terris Bodin, NP

## 2017-12-18 NOTE — Discharge Instructions (Addendum)
It was our pleasure to provide your ER care today - we hope that you feel better.  For mental health issues and/or crisis, [facility]  Also follow up with primary care doctor in the coming week.  Return to ER if worse, new symptoms, fevers, trouble breathing, or other emergency.

## 2017-12-18 NOTE — ED Notes (Signed)
TTS in process-Monique,RN  

## 2017-12-18 NOTE — ED Provider Notes (Signed)
Pt is awake/alert. Normal mood and affect. Pt does not voice any thoughts of harm to self or others. No SI. Normal appetite. Pt does acknowledge stresses related to homelessness and substance abuse. Will provide resource guide. rec outpt bh and medical/pcp f/u.  Pt currently appears stable for d/c.      Cathren LaineSteinl, Jaretzy Lhommedieu, MD 12/18/17 1001

## 2017-12-18 NOTE — BH Assessment (Signed)
[facility]   Clinician spoke with nurse Dorathy DaftKayla.  She said to call back in 20 minutes for teleassessment.  Pt being moved to pod F.

## 2017-12-18 NOTE — ED Notes (Signed)
Refused e-signature

## 2017-12-18 NOTE — BH Assessment (Addendum)
Tele Assessment Note   Patient Name: [name] MRN: [id number] Referring Physician: Dr. Gwyneth Sprout Location of Patient: [facility] Location of Provider: Behavioral Health TTS Department  [name] is an 57 y.o. male.  -Clinicain reviewed note by Dr. Anitra Lauth.  Patient is a 57 year old male with a history of schizophrenia, polysubstance abuse, homelessness, diabetes, hepatitis C presenting today with suicidal ideation.  Patient states that there is a demon in his throat choking him and it makes him want to die.  He also states a dragon has come into his mouth and makes it hard for him to swallow.  He states he supposed to be on Tegretol and Abilify but he has not been taking those medications.  Patient denies homicidal ideation.  He states the demons talk to him but only say one word at a time.  The demons do not tell him to hurt himself.  Patient was seen yesterday for foot pain however he is denying any pain in his feet currently.  He denies any fever, shortness of breath, cough or chest pain.  Patient last used crack today.  He also drank alcohol today.  Patient tells this clinician that he knows that his schizophrenia is acting up and he has not been on his medications.  Patient has had no medications for an undetermined amount of time.  Patient says he needs to sleep, that he has not gotten any sleep for a few days he says.  He then proceeded to fall asleep. Pt did not engage with clinician anymore.  Patient was assessed on 06/14.  He has had inpatient care in the past at [facility] a few years ago.  Patient has no outpatient care.  -Clinician did discuss patient care with Nira Conn, FNP who recommends patient be observed overnight and have an AM psych eval.  Patient disposition discussed with Dr. Anitra Lauth and she was in agreement.   Diagnosis: F20.9 Schizophrenia  Past Medical History:  Past Medical History:  Diagnosis Date  . CHF (congestive heart failure) (HCC)   .  Chronic foot pain   . Cocaine abuse (HCC)   . Diabetes mellitus without complication (HCC)   . Hepatitis C   . Homelessness   . Homelessness   . Hypertension   . Neuropathy   . Polysubstance abuse (HCC)   . Schizophrenia (HCC)     No past surgical history on file.  Family History:  Family History  Problem Relation Age of Onset  . Hypertension Other   . Diabetes Other     Social History:  reports that he has been smoking cigarettes.  He has a 20.00 pack-year smoking history. He uses smokeless tobacco. He reports that he drinks alcohol. He reports that he has current or past drug history. Drugs: "Crack" cocaine, Cocaine, and Marijuana. Frequency: 7.00 times per week.  Additional Social History:  Alcohol / Drug Use Pain Medications: Pt off his medication Prescriptions: Pt off his medications Over the Counter: Pt off his medications. History of alcohol / drug use?: Yes Substance #1 Name of Substance 1: Cocaine 1 - Age of First Use: unknown 1 - Amount (size/oz): Unknown 1 - Frequency: Unknown 1 - Duration: Pt uses on an on-going basis 1 - Last Use / Amount: Pt sleeping and will not answer questions.  CIWA: CIWA-Ar BP: (!) 163/101 Pulse Rate: (!) 107 COWS:    Allergies:  Allergies  Allergen Reactions  . Haldol [Haloperidol] Other (See Comments)    Muscle spasms, loss of voluntary  movement. However, pt has taken Thorazine on multiple occasions with no adverse effects.     Home Medications:  (Not in a hospital admission)  OB/GYN Status:  No LMP for male patient.  General Assessment Data Assessment unable to be completed: Yes Reason for not completing assessment: Pt being moved to pod F [facility] Assessment: In system Is this a Tele or Face-to-Face Assessment?: Tele Assessment Is this an Initial Assessment or a Re-assessment for this encounter?: Initial Assessment Marital status: Single Is patient pregnant?: No Pregnancy Status: No Living  Arrangements: Other (Comment)(Pt is homeless.) Can pt return to current living arrangement?: Yes Admission Status: Voluntary Is patient capable of signing voluntary admission?: Yes Referral Source: Self/Family/Friend Insurance type: self pay     Crisis Care Plan Living Arrangements: Other (Comment)(Pt is homeless.) Name of Psychiatrist: none Name of Therapist: None  Education Status Is patient currently in school?: No Is the patient employed, unemployed or receiving disability?: Unemployed  Risk to self with the past 6 months Suicidal Ideation: Yes-Currently Present Has patient been a risk to self within the past 6 months prior to admission? : No Suicidal Intent: No Has patient had any suicidal intent within the past 6 months prior to admission? : No Is patient at risk for suicide?: Yes Suicidal Plan?: Yes-Currently Present Has patient had any suicidal plan within the past 6 months prior to admission? : No Specify Current Suicidal Plan: Jump off a bridge Access to Means: Yes Specify Access to Suicidal Means: Bridges in city. What has been your use of drugs/alcohol within the last 12 months?: Cocaine & ETOH Previous Attempts/Gestures: (Unknown) How many times?: (Unknown) Other Self Harm Risks: SA issues Triggers for Past Attempts: Unpredictable Intentional Self Injurious Behavior: None Family Suicide History: No Recent stressful life event(s): Other (Comment)(Being off meds and homelessness) Persecutory voices/beliefs?: Yes Depression: Yes Depression Symptoms: Feeling angry/irritable, Despondent Substance abuse history and/or treatment for substance abuse?: Yes Suicide prevention information given to non-admitted patients: Not applicable  Risk to Others within the past 6 months Homicidal Ideation: No Does patient have any lifetime risk of violence toward others beyond the six months prior to admission? : Unknown Thoughts of Harm to Others: No Current Homicidal Intent:  No Current Homicidal Plan: No Access to Homicidal Means: No Identified Victim: No one History of harm to others?: No Assessment of Violence: None Noted Violent Behavior Description: None reported Does patient have access to weapons?: No Criminal Charges Pending?: (Unknown) Does patient have a court date: (Unknown) Court Date: (Unknown) Is patient on probation?: Unknown  Psychosis Hallucinations: Auditory(Demons talking to him.) Delusions: None noted  Mental Status Report Appearance/Hygiene: Body odor, Disheveled, In scrubs Eye Contact: Poor Motor Activity: Freedom of movement, Unremarkable Speech: Incoherent Level of Consciousness: Drowsy, Sleeping Mood: Sad Affect: Irritable Anxiety Level: Moderate Thought Processes: Irrelevant Judgement: Impaired Orientation: Unable to assess Obsessive Compulsive Thoughts/Behaviors: Unable to Assess  Cognitive Functioning Concentration: Unable to Assess Memory: Unable to Assess Is patient IDD: No Is patient DD?: No Insight: Poor Impulse Control: Poor Appetite: (UtA) Have you had any weight changes? : (UTA) Sleep: Unable to Assess Total Hours of Sleep: (Unknown) Vegetative Symptoms: Unable to Assess  ADLScreening [facility] Services) Patient's cognitive ability adequate to safely complete daily activities?: Yes Patient able to express need for assistance with ADLs?: Yes Independently performs ADLs?: Yes (appropriate for developmental age)  Prior Inpatient Therapy Prior Inpatient Therapy: Yes Prior Therapy Dates: 12/2014 [facility] Reason for Treatment: schizophrenia  Prior Outpatient Therapy Prior Outpatient Therapy: No Does patient have an ACCT team?: No Does patient have Intensive In-House Services?  : No Does patient have [name] services? : No Does patient have P4CC services?: No  ADL Screening (condition at time of admission) Patient's cognitive ability adequate to safely complete  daily activities?: Yes Is the patient deaf or have difficulty hearing?: No Does the patient have difficulty seeing, even when wearing glasses/contacts?: No Does the patient have difficulty concentrating, remembering, or making decisions?: No Patient able to express need for assistance with ADLs?: Yes Does the patient have difficulty dressing or bathing?: No Independently performs ADLs?: Yes (appropriate for developmental age) Does the patient have difficulty walking or climbing stairs?: No Weakness of Legs: None Weakness of Arms/Hands: None       Abuse/Neglect Assessment (Assessment to be complete while patient is alone) Abuse/Neglect Assessment Can Be Completed: Yes Physical Abuse: Denies Verbal Abuse: Denies Sexual Abuse: Denies Exploitation of patient/patient's resources: Denies Self-Neglect: Denies     Merchant [employer] officer (For Healthcare) Does Patient Have a Medical Advance Directive?: No Would patient like information on creating a medical advance directive?: No - Patient declined          Disposition:  Disposition Initial Assessment Completed for this Encounter: Yes Patient referred to: Other (Comment)(Review by FNP)  This service was provided via telemedicine using a 2-way, interactive audio and video technology.  Names of all persons participating in this telemedicine service and their role in this encounter. Name:  Role:   Name:  Role:   Name:  Role:   Name:  Role:     Alexandria Lodge 12/18/2017 4:14 AM

## 2017-12-18 NOTE — ED Triage Notes (Signed)
Pt requesting detox for crack cocaine and suicidal ideation with plan to jump off a bridge; reports "having a demon in me." c/o feet pain "but a shower will help because I haven't showered in two weeks."

## 2017-12-18 NOTE — ED Notes (Signed)
D/c given to patient. Patient is agitated, screaming "HOW ARE YOU DISCHARGING SOMEONE WHO IS SUICIDAL" Patient is encouraged to calm down. He states "you will have to drag me out" Security notified

## 2017-12-18 NOTE — ED Notes (Signed)
Dc to home

## 2017-12-19 ENCOUNTER — Emergency Department (HOSPITAL_COMMUNITY): Payer: Medicaid Other

## 2017-12-19 ENCOUNTER — Emergency Department (HOSPITAL_COMMUNITY)
Admission: EM | Admit: 2017-12-19 | Discharge: 2017-12-19 | Discharge status: Against Medical Advice | Payer: Medicaid Other | Attending: Emergency Medicine | Admitting: Emergency Medicine

## 2017-12-19 ENCOUNTER — Encounter (HOSPITAL_COMMUNITY): Payer: Self-pay

## 2017-12-19 DIAGNOSIS — R0789 Other chest pain: Secondary | ICD-10-CM | POA: Diagnosis not present

## 2017-12-19 DIAGNOSIS — R45851 Suicidal ideations: Secondary | ICD-10-CM | POA: Insufficient documentation

## 2017-12-19 DIAGNOSIS — F1910 Other psychoactive substance abuse, uncomplicated: Secondary | ICD-10-CM

## 2017-12-19 DIAGNOSIS — I110 Hypertensive heart disease with heart failure: Secondary | ICD-10-CM | POA: Diagnosis not present

## 2017-12-19 DIAGNOSIS — F200 Paranoid schizophrenia: Secondary | ICD-10-CM | POA: Diagnosis not present

## 2017-12-19 DIAGNOSIS — E1140 Type 2 diabetes mellitus with diabetic neuropathy, unspecified: Secondary | ICD-10-CM | POA: Insufficient documentation

## 2017-12-19 DIAGNOSIS — F17210 Nicotine dependence, cigarettes, uncomplicated: Secondary | ICD-10-CM | POA: Diagnosis not present

## 2017-12-19 DIAGNOSIS — Z79899 Other long term (current) drug therapy: Secondary | ICD-10-CM | POA: Insufficient documentation

## 2017-12-19 DIAGNOSIS — F1410 Cocaine abuse, uncomplicated: Secondary | ICD-10-CM | POA: Insufficient documentation

## 2017-12-19 DIAGNOSIS — I509 Heart failure, unspecified: Secondary | ICD-10-CM | POA: Diagnosis not present

## 2017-12-19 DIAGNOSIS — R0602 Shortness of breath: Secondary | ICD-10-CM | POA: Diagnosis not present

## 2017-12-19 LAB — CBC
HCT: 37.8 % — ABNORMAL LOW (ref 39.0–52.0)
HEMOGLOBIN: 11.6 g/dL — AB (ref 13.0–17.0)
MCH: 28.4 pg (ref 26.0–34.0)
MCHC: 30.7 g/dL (ref 30.0–36.0)
MCV: 92.4 fL (ref 78.0–100.0)
Platelets: 193 10*3/uL (ref 150–400)
RBC: 4.09 MIL/uL — ABNORMAL LOW (ref 4.22–5.81)
RDW: 14.1 % (ref 11.5–15.5)
WBC: 7.3 10*3/uL (ref 4.0–10.5)

## 2017-12-19 LAB — BASIC METABOLIC PANEL
ANION GAP: 8 (ref 5–15)
BUN: 11 mg/dL (ref 6–20)
CHLORIDE: 107 mmol/L (ref 101–111)
CO2: 26 mmol/L (ref 22–32)
Calcium: 9.3 mg/dL (ref 8.9–10.3)
Creatinine, Ser: 1.04 mg/dL (ref 0.61–1.24)
GFR calc Af Amer: >60 mL/min (ref >60)
GFR calc non Af Amer: >60 mL/min (ref >60)
Glucose, Bld: 125 mg/dL — ABNORMAL HIGH (ref 65–99)
POTASSIUM: 3.8 mmol/L (ref 3.5–5.1)
Sodium: 141 mmol/L (ref 135–145)

## 2017-12-19 LAB — I-STAT TROPONIN, ED: Troponin i, poc: 0.04 ng/mL (ref 0.00–0.08)

## 2017-12-19 LAB — BRAIN NATRIURETIC PEPTIDE: B Natriuretic Peptide: 210.9 pg/mL — ABNORMAL HIGH (ref 0.0–100.0)

## 2017-12-19 NOTE — ED Notes (Signed)
Pt denies chest pain at this time.

## 2017-12-19 NOTE — ED Provider Notes (Signed)
[facility] EMERGENCY DEPARTMENT Provider Note   CSN: [id number] Arrival date & time: 12/19/17  0102     History   Chief Complaint Chief Complaint  Patient presents with  . Chest Pain    HPI [name] is a 57 y.o. male.  HPI   Pt is a 57 y/o with a h/o schizophrenia, polysubstance abuse, homelessness, DM, hep c, who presents to the ED today to be evaluated for chest pain and shortness of breath which has been present intermittently since last week. Pt states pain located to midsternal chest and he gets the feeling before he uses cocaine. States that the sxs are not worse after cocaine use. He denies sxs currently. No leg swelling. No abd pain. No fevers, cough, or URI sxs. States he used cocaine several hours PTA. He also states that he wants a prescription for his tegretol. He states he has suicidal thoughts that he has them all the time. He does not have a plan. He does not endorse HI or AVH.   Past Medical History:  Diagnosis Date  . CHF (congestive heart failure) (HCC)   . Chronic foot pain   . Cocaine abuse (HCC)   . Diabetes mellitus without complication (HCC)   . Hepatitis C   . Homelessness   . Homelessness   . Hypertension   . Neuropathy   . Polysubstance abuse (HCC)   . Schizophrenia [facility])     Patient Active Problem List   Diagnosis Date Noted  . Cocaine abuse with cocaine-induced mood disorder (HCC) 09/18/2017  . Chronic foot pain   . Schizoaffective disorder, bipolar type (HCC) 09/30/2016  . Suicidal thoughts   . Substance induced mood disorder (HCC) 03/13/2015  . Acute kidney failure (HCC) 01/26/2015  . Schizophrenia, paranoid type (HCC) 01/17/2015  . Suicidal ideation   . Drug hallucinosis (HCC) 10/08/2014  . Chronic paranoid schizophrenia (HCC) 09/07/2014  . 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 Acute CHF (congestive heart failure) (HCC) 07/29/2014    . Cocaine use disorder, severe, dependence (HCC)   . Cocaine abuse (HCC) 03/10/2014  . Essential hypertension, benign 03/28/2013  . Diabetes mellitus (HCC) 03/15/2013    History reviewed. No pertinent surgical history.      Home Medications    Prior to Admission medications   Medication Sig Start Date End Date Taking? Authorizing Provider  acetaminophen (TYLENOL) 500 MG tablet Take 1 tablet (500 mg total) by mouth every 6 (six) hours as needed. Patient not taking: Reported on 12/10/2017 12/07/17   McDonald, Mia A, PA-C  ARIPiprazole (ABILIFY) 10 MG tablet Take 10 mg by mouth daily.     [provider]  benztropine (COGENTIN) 1 MG tablet Take 1 mg by mouth daily.    [provider]  carbamazepine (TEGRETOL) 200 MG tablet Take 200 mg by mouth daily.     [provider]  Cetirizine HCl (ZYRTEC ALLERGY) 10 MG CAPS Take 1 capsule (10 mg total) by mouth daily. Patient not taking: Reported on 11/23/2017 10/03/17   Ward, Layla Maw, DO  clotrimazole (LOTRIMIN) 1 % cream Apply to affected area 2 times daily Patient not taking: Reported on 12/18/2017 12/17/17   Roxy Horseman, PA-C  fluticasone [facility]) 50 MCG/ACT nasal spray Place 2 sprays into both nostrils daily. Patient not taking: Reported on 11/23/2017 10/03/17   Ward, Layla Maw, DO  hydrochlorothiazide (HYDRODIURIL) 25 MG tablet Take 1 tablet (25 mg total) by mouth  daily. Patient not taking: Reported on 11/23/2017 10/13/17   Ward, Layla MawKristen N, DO  hydrOXYzine (ATARAX/VISTARIL) 50 MG tablet Take 50 mg by mouth 2 (two) times daily.    [provider]  lisinopril (PRINIVIL,ZESTRIL) 40 MG tablet Take 1 tablet (40 mg total) by mouth daily. Patient not taking: Reported on 11/23/2017 10/13/17   Ward, Layla MawKristen N, DO    Family History Family History  Problem Relation Age of Onset  . Hypertension Other   . Diabetes Other     Social History Social History   Tobacco Use  . Smoking status: Current Every Day Smoker     Packs/day: 1.00    Years: 20.00    Pack years: 20.00    Types: Cigarettes  . Smokeless tobacco: Current User  Substance Use Topics  . Alcohol use: Yes    Comment: Daily Drinker   . Drug use: Yes    Frequency: 7.0 times per week    Types: "Crack" cocaine, Cocaine, Marijuana    Comment: Cocaine tonight, Marijuana "a long time"     Allergies   Haldol [haloperidol]   Review of Systems Review of Systems  Constitutional: Negative for fever.  HENT: Negative for sore throat.   Eyes: Negative for visual disturbance.  Respiratory: Positive for shortness of breath.   Cardiovascular: Positive for chest pain. Negative for leg swelling.  Gastrointestinal: Negative for abdominal pain.  Genitourinary: Negative for flank pain.  Neurological: Negative for headaches.  Psychiatric/Behavioral:       Chronic SI (no plan), no HI or AVH   Physical Exam Updated Vital Signs BP (!) 168/101 (BP Location: Right Arm)   Pulse 94   Temp 98.7 F (37.1 C) (Oral)   Resp 18   SpO2 95%   Physical Exam  Constitutional: He appears well-developed and well-nourished. No distress.  Pt sleeping comfortably in bed prior to exam. Easily arousable.  HENT:  Head: Normocephalic and atraumatic.  Eyes: Conjunctivae are normal.  Neck: Neck supple. No JVD present.  Cardiovascular: Normal rate, regular rhythm and intact distal pulses.  No murmur heard. Pulmonary/Chest: Effort normal and breath sounds normal. No respiratory distress. He has no decreased breath sounds. He has no wheezes. He has no rhonchi. He has no rales.  Abdominal: Soft. Bowel sounds are normal. He exhibits no distension. There is no tenderness. There is no guarding.  Musculoskeletal: He exhibits no edema.       Right lower leg: Normal. He exhibits no tenderness and no edema.       Left lower leg: Normal. He exhibits no tenderness and no edema.  Neurological: He is alert.  Skin: Skin is warm and dry. Capillary refill takes less than 2 seconds.   Psychiatric:  Pt appears intoxicated.   Nursing note and vitals reviewed.    ED Treatments / Results  Labs (all labs ordered are listed, but only abnormal results are displayed) Labs Reviewed  BASIC METABOLIC PANEL - Abnormal; Notable for the following components:      Result Value   Glucose, Bld 125 (*)    All other components within normal limits  CBC - Abnormal; Notable for the following components:   RBC 4.09 (*)    Hemoglobin 11.6 (*)    HCT 37.8 (*)    All other components within normal limits  BRAIN NATRIURETIC PEPTIDE - Abnormal; Notable for the following components:   B Natriuretic Peptide 210.9 (*)    All other components within normal limits  I-STAT TROPONIN, ED  EKG EKG Interpretation  Date/Time:  Friday December 19 2017 01:17:59 EDT Ventricular Rate:  100 PR Interval:  124 QRS Duration: 78 QT Interval:  380 QTC Calculation: 490 R Axis:   40 Text Interpretation:  Normal sinus rhythm Minimal voltage criteria for LVH, may be normal variant Septal infarct , age undetermined T wave abnormality, consider lateral ischemia Abnormal ECG No significant change since last tracing Confirmed by Shaune Pollack [phone]) on 12/19/2017 6:37:49 AM   Radiology Dg Chest 2 View  Result Date: 12/19/2017 CLINICAL DATA:  Chest pain EXAM: CHEST - 2 VIEW COMPARISON:  12/16/2017, 10/03/2017 FINDINGS: No acute airspace disease or pleural effusion. Normal heart size. Aortic atherosclerosis. No pneumothorax. Metallic opacity over the right anterior lower chest wall. IMPRESSION: No active cardiopulmonary disease. Electronically Signed   By: [name]   On: 12/19/2017 01:53    Procedures Procedures (including critical care time)  Medications Ordered in ED Medications - No data to display   Initial Impression / Assessment and Plan / ED Course  I have reviewed the triage vital signs and the nursing notes.  Pertinent labs & imaging results that were available during my care of  the patient were reviewed by me and considered in my medical decision making (see chart for details).  Discussed pt presentation and exam findings with Dr. Erma Heritage, who agrees with the plan to discharge patient home as his workup is negative and he was just recently seen by [facility] for chronic suicidal ideations.   8:18 AM pt awake and alert and is now requesting discharge. Prior to this he had been sleeping comfortably  In the department. He no longer appears intoxicated. He is now somewhat agitated and is cursing at myself and Psychologist, educational. he states that no one has brought him breakfast and that he wants to leave.  He is denying any sxs currently and has no thoughts of wanting to harm himself or others. he states that he wants to leave so that he can go eat breakfast. He appears to have decision making capacity. Ambulatory with steady gait. Pt has well documented h/o chronic suicidal ideations without a plan. Was recently seen by [facility]. He denies a plan for harming himself and is no longer endorsing SI, HI, or AVH at time that he is requesting discharge. He was given resources to f/u with pcp and psychiatric services at his last visit yesterday. His labs today are reassuring. Has a stable anemia. Negative trop, mild elevation of BNP however pt does not appear fluid overloaded. CXR negative for pulmonary edema. Lungs CTA. No JVD of peripheral edema. Appears stable for discharge. Pt eloped prior to receiving discharge paperwork.   Final Clinical Impressions(s) / ED Diagnoses   Final diagnoses:  None    ED Discharge Orders    None       Karrie Meres, PA-C 12/20/17 0551    Shaune Pollack, MD 12/24/17 [phone]

## 2017-12-19 NOTE — ED Notes (Signed)
Pt left AMA °
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
## 2017-12-20 ENCOUNTER — Other Ambulatory Visit: Payer: Self-pay

## 2017-12-20 ENCOUNTER — Encounter (HOSPITAL_COMMUNITY): Payer: Self-pay | Admitting: Emergency Medicine

## 2017-12-20 ENCOUNTER — Emergency Department (HOSPITAL_COMMUNITY)
Admission: EM | Admit: 2017-12-20 | Discharge: 2017-12-20 | Disposition: A | Discharge status: Home / Self Care | Payer: Self-pay | Attending: Emergency Medicine | Admitting: Emergency Medicine

## 2017-12-20 DIAGNOSIS — F17210 Nicotine dependence, cigarettes, uncomplicated: Secondary | ICD-10-CM | POA: Insufficient documentation

## 2017-12-20 DIAGNOSIS — I509 Heart failure, unspecified: Secondary | ICD-10-CM | POA: Insufficient documentation

## 2017-12-20 DIAGNOSIS — F209 Schizophrenia, unspecified: Secondary | ICD-10-CM | POA: Insufficient documentation

## 2017-12-20 DIAGNOSIS — E119 Type 2 diabetes mellitus without complications: Secondary | ICD-10-CM | POA: Insufficient documentation

## 2017-12-20 DIAGNOSIS — Z590 Homelessness: Secondary | ICD-10-CM | POA: Insufficient documentation

## 2017-12-20 DIAGNOSIS — Z79899 Other long term (current) drug therapy: Secondary | ICD-10-CM | POA: Insufficient documentation

## 2017-12-20 DIAGNOSIS — I110 Hypertensive heart disease with heart failure: Secondary | ICD-10-CM | POA: Insufficient documentation

## 2017-12-20 MED ORDER — ACETAMINOPHEN 325 MG PO TABS
650.0000 mg | ORAL_TABLET | Freq: Once | ORAL | Status: AC
  Administered 2017-12-20: 650 mg via ORAL
  Filled 2017-12-20: qty 2

## 2017-12-20 NOTE — ED Provider Notes (Signed)
[facility] EMERGENCY DEPARTMENT Provider Note   CSN: [id number] Arrival date & time: 12/20/17  0159     History   Chief Complaint Chief Complaint  Patient presents with  . Suicidal    HPI [name] is a 57 y.o. male.  HPI Pt is homeless. He is requesting a tylenol and a sandwhich at this time. He reports he is not suicidal, he simply wants something to eat. No other complaints at this time   Past Medical History:  Diagnosis Date  . CHF (congestive heart failure) (HCC)   . Chronic foot pain   . Cocaine abuse (HCC)   . Diabetes mellitus without complication (HCC)   . Hepatitis C   . Homelessness   . Homelessness   . Hypertension   . Neuropathy   . Polysubstance abuse (HCC)   . Schizophrenia [facility])     Patient Active Problem List   Diagnosis Date Noted  . Cocaine abuse with cocaine-induced mood disorder (HCC) 09/18/2017  . Chronic foot pain   . Schizoaffective disorder, bipolar type (HCC) 09/30/2016  . Suicidal thoughts   . Substance induced mood disorder (HCC) 03/13/2015  . Acute kidney failure (HCC) 01/26/2015  . Schizophrenia, paranoid type (HCC) 01/17/2015  . Suicidal ideation   . Drug hallucinosis (HCC) 10/08/2014  . Chronic paranoid schizophrenia (HCC) 09/07/2014  . 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 Acute CHF (congestive heart failure) (HCC) 07/29/2014  . Cocaine use disorder, severe, dependence (HCC)   . Cocaine abuse (HCC) 03/10/2014  . Essential hypertension, benign 03/28/2013  . Diabetes mellitus (HCC) 03/15/2013    History reviewed. No pertinent surgical history.      Home Medications    Prior to Admission medications   Medication Sig Start Date End Date Taking? Authorizing Provider  acetaminophen (TYLENOL) 500 MG tablet Take 1 tablet (500 mg total) by mouth every 6 (six) hours as needed. Patient not taking: Reported on 12/10/2017 12/07/17    McDonald, Mia A, PA-C  ARIPiprazole (ABILIFY) 10 MG tablet Take 10 mg by mouth daily.     [provider]  benztropine (COGENTIN) 1 MG tablet Take 1 mg by mouth daily.    [provider]  carbamazepine (TEGRETOL) 200 MG tablet Take 200 mg by mouth daily.     [provider]  Cetirizine HCl (ZYRTEC ALLERGY) 10 MG CAPS Take 1 capsule (10 mg total) by mouth daily. Patient not taking: Reported on 11/23/2017 10/03/17   Ward, Layla Maw, DO  clotrimazole (LOTRIMIN) 1 % cream Apply to affected area 2 times daily Patient not taking: Reported on 12/18/2017 12/17/17   Roxy Horseman, PA-C  fluticasone [facility]) 50 MCG/ACT nasal spray Place 2 sprays into both nostrils daily. Patient not taking: Reported on 11/23/2017 10/03/17   Ward, Layla Maw, DO  hydrochlorothiazide (HYDRODIURIL) 25 MG tablet Take 1 tablet (25 mg total) by mouth daily. Patient not taking: Reported on 11/23/2017 10/13/17   Ward, Layla Maw, DO  hydrOXYzine (ATARAX/VISTARIL) 50 MG tablet Take 50 mg by mouth 2 (two) times daily.    [provider]  lisinopril (PRINIVIL,ZESTRIL) 40 MG tablet Take 1 tablet (40 mg total) by mouth daily. Patient not taking: Reported on 11/23/2017 10/13/17   Ward, Layla Maw, DO    Family History Family History  Problem Relation Age of Onset  . Hypertension Other   . Diabetes Other     Social History Social History   Tobacco Use  .  Smoking status: Current Every Day Smoker    Packs/day: 1.00    Years: 20.00    Pack years: 20.00    Types: Cigarettes  . Smokeless tobacco: Current User  Substance Use Topics  . Alcohol use: Yes    Comment: Daily Drinker   . Drug use: Yes    Frequency: 7.0 times per week    Types: "Crack" cocaine, Cocaine, Marijuana    Comment: Cocaine tonight, Marijuana "a long time"     Allergies   Haldol [haloperidol]   Review of Systems Review of Systems  All other systems reviewed and are negative.    Physical Exam Updated Vital  Signs There were no vitals taken for this visit.  Physical Exam  Constitutional: He is oriented to person, place, and time. He appears well-developed and well-nourished.  HENT:  Head: Normocephalic.  Eyes: EOM are normal.  Neck: Normal range of motion.  Pulmonary/Chest: Effort normal.  Abdominal: He exhibits no distension.  Musculoskeletal: Normal range of motion.  Neurological: He is alert and oriented to person, place, and time.  Psychiatric: He has a normal mood and affect.  Nursing note and vitals reviewed.    ED Treatments / Results  Labs (all labs ordered are listed, but only abnormal results are displayed) Labs Reviewed - No data to display  EKG None  Radiology Dg Chest 2 View  Result Date: 12/19/2017 CLINICAL DATA:  Chest pain EXAM: CHEST - 2 VIEW COMPARISON:  12/16/2017, 10/03/2017 FINDINGS: No acute airspace disease or pleural effusion. Normal heart size. Aortic atherosclerosis. No pneumothorax. Metallic opacity over the right anterior lower chest wall. IMPRESSION: No active cardiopulmonary disease. Electronically Signed   By: Jasmine PangKim  Fujinaga M.D.   On: 12/19/2017 01:53    Procedures Procedures (including critical care time)  Medications Ordered in ED Medications  acetaminophen (TYLENOL) tablet 650 mg (has no administration in time range)     Initial Impression / Assessment and Plan / ED Course  I have reviewed the triage vital signs and the nursing notes.  Pertinent labs & imaging results that were available during my care of the patient were reviewed by me and considered in my medical decision making (see chart for details).       Final Clinical Impressions(s) / ED Diagnoses   Final diagnoses:  None    ED Discharge Orders    None       Azalia Bilisampos, Irlanda Croghan, MD 12/20/17 Earle [id number]

## 2017-12-20 NOTE — ED Triage Notes (Signed)
Pt came into triage stating, "I'm going to behave today."  Requesting water to drink before he will go in triage room.  Explained to pt that he cannot have water yet and that we need to get him triaged.  Pt trying to drink water from sink.  Encouraged pt to sit in triage chair to get triaged.  Pt takes his coat off and says I need you to bring me some 2x scrubs.  States he is suicidal and needs a sandwich.  Dr. Patria Maneampos at triage to talk to pt.

## 2017-12-21 ENCOUNTER — Encounter (HOSPITAL_COMMUNITY): Payer: Self-pay | Admitting: Emergency Medicine

## 2017-12-21 ENCOUNTER — Encounter (HOSPITAL_COMMUNITY): Payer: Self-pay

## 2017-12-21 ENCOUNTER — Emergency Department (HOSPITAL_COMMUNITY)
Admission: EM | Admit: 2017-12-21 | Discharge: 2017-12-21 | Disposition: A | Discharge status: Home / Self Care | Payer: Medicaid Other | Attending: Emergency Medicine | Admitting: Emergency Medicine

## 2017-12-21 ENCOUNTER — Emergency Department (HOSPITAL_COMMUNITY)
Admission: EM | Admit: 2017-12-21 | Discharge: 2017-12-22 | Disposition: A | Discharge status: Home / Self Care | Payer: Medicaid Other | Attending: Emergency Medicine | Admitting: Emergency Medicine

## 2017-12-21 DIAGNOSIS — Z794 Long term (current) use of insulin: Secondary | ICD-10-CM | POA: Insufficient documentation

## 2017-12-21 DIAGNOSIS — E1140 Type 2 diabetes mellitus with diabetic neuropathy, unspecified: Secondary | ICD-10-CM | POA: Insufficient documentation

## 2017-12-21 DIAGNOSIS — F17210 Nicotine dependence, cigarettes, uncomplicated: Secondary | ICD-10-CM | POA: Insufficient documentation

## 2017-12-21 DIAGNOSIS — F209 Schizophrenia, unspecified: Secondary | ICD-10-CM | POA: Insufficient documentation

## 2017-12-21 DIAGNOSIS — Z0000 Encounter for general adult medical examination without abnormal findings: Secondary | ICD-10-CM | POA: Diagnosis present

## 2017-12-21 DIAGNOSIS — Z79899 Other long term (current) drug therapy: Secondary | ICD-10-CM | POA: Diagnosis not present

## 2017-12-21 DIAGNOSIS — I509 Heart failure, unspecified: Secondary | ICD-10-CM | POA: Insufficient documentation

## 2017-12-21 DIAGNOSIS — I110 Hypertensive heart disease with heart failure: Secondary | ICD-10-CM | POA: Insufficient documentation

## 2017-12-21 DIAGNOSIS — F250 Schizoaffective disorder, bipolar type: Secondary | ICD-10-CM | POA: Insufficient documentation

## 2017-12-21 DIAGNOSIS — E119 Type 2 diabetes mellitus without complications: Secondary | ICD-10-CM | POA: Diagnosis not present

## 2017-12-21 DIAGNOSIS — R45851 Suicidal ideations: Secondary | ICD-10-CM | POA: Diagnosis not present

## 2017-12-21 DIAGNOSIS — Z590 Homelessness: Secondary | ICD-10-CM | POA: Diagnosis not present

## 2017-12-21 LAB — ETHANOL: Alcohol, Ethyl (B): <10 mg/dL (ref <10)

## 2017-12-21 LAB — COMPREHENSIVE METABOLIC PANEL
ALT: 13 U/L — AB (ref 17–63)
ANION GAP: 7 (ref 5–15)
AST: 30 U/L (ref 15–41)
Albumin: 3.3 g/dL — ABNORMAL LOW (ref 3.5–5.0)
Alkaline Phosphatase: 86 U/L (ref 38–126)
BILIRUBIN TOTAL: 0.2 mg/dL — AB (ref 0.3–1.2)
BUN: 16 mg/dL (ref 6–20)
CALCIUM: 8.9 mg/dL (ref 8.9–10.3)
CHLORIDE: 109 mmol/L (ref 101–111)
CO2: 24 mmol/L (ref 22–32)
Creatinine, Ser: 1.06 mg/dL (ref 0.61–1.24)
GFR calc non Af Amer: >60 mL/min (ref >60)
Glucose, Bld: 201 mg/dL — ABNORMAL HIGH (ref 65–99)
Potassium: 4 mmol/L (ref 3.5–5.1)
SODIUM: 140 mmol/L (ref 135–145)
Total Protein: 6.2 g/dL — ABNORMAL LOW (ref 6.5–8.1)

## 2017-12-21 LAB — CBC
HCT: 37 % — ABNORMAL LOW (ref 39.0–52.0)
HEMOGLOBIN: 11 g/dL — AB (ref 13.0–17.0)
MCH: 28.7 pg (ref 26.0–34.0)
MCHC: 29.7 g/dL — ABNORMAL LOW (ref 30.0–36.0)
MCV: 96.6 fL (ref 78.0–100.0)
Platelets: 166 10*3/uL (ref 150–400)
RBC: 3.83 MIL/uL — ABNORMAL LOW (ref 4.22–5.81)
RDW: 14.1 % (ref 11.5–15.5)
WBC: 5.3 10*3/uL (ref 4.0–10.5)

## 2017-12-21 LAB — RAPID URINE DRUG SCREEN, HOSP PERFORMED
Amphetamines: NOT DETECTED
Benzodiazepines: NOT DETECTED
COCAINE: POSITIVE — AB
OPIATES: NOT DETECTED
TETRAHYDROCANNABINOL: NOT DETECTED

## 2017-12-21 LAB — ACETAMINOPHEN LEVEL

## 2017-12-21 LAB — SALICYLATE LEVEL

## 2017-12-21 NOTE — ED Triage Notes (Signed)
Pt presents to triage complaint free. Pt demanding we check his iron and diabetes.  Attempts made to explain to patient we have checked lab work multiple days this week. Pressured speech, pt states he did use cocaine yesterday.  Pt very argumentative with staff in triage.

## 2017-12-21 NOTE — ED Notes (Signed)
Pt. Seen walking out of front lobby doors

## 2017-12-21 NOTE — ED Provider Notes (Signed)
[facility] EMERGENCY DEPARTMENT Provider Note   CSN: [id number] Arrival date & time: 12/21/17  2123     History   Chief Complaint Chief Complaint  Patient presents with  . Suicidal    HPI [name] is a 57 y.o. male.  car    [name] is a 57 y.o. male with history of schizophrenia, congestive heart failure, diabetes, hypertension, hepatitis C, polysubstance abuse, presented to ED for suicidal thoughts and wanting his medications refilled.  Although he told the triage notes he wants a shot of something, he stated to me that he is supposed to be on Abilify and Tegretol but does not have any of his meds.  He states he normally goes to [place] to get them filled.  He is requesting to be admitted to behavioral health because he states "I am on drugs, need to get off drugs, and any my meds."  Patient states he is homeless.  He states he has been hallucinating and seeing demons.  He is hearing voices.  He has no other complaints at this time. This patient's 11th visit in the last 13 days emergency department.  Past Medical History:  Diagnosis Date  . CHF (congestive heart failure) (HCC)   . Chronic foot pain   . Cocaine abuse (HCC)   . Diabetes mellitus without complication (HCC)   . Hepatitis C   . Homelessness   . Homelessness   . Hypertension   . Neuropathy   . Polysubstance abuse (HCC)   . Schizophrenia [facility])     Patient Active Problem List   Diagnosis Date Noted  . Cocaine abuse with cocaine-induced mood disorder (HCC) 09/18/2017  . Chronic foot pain   . Schizoaffective disorder, bipolar type (HCC) 09/30/2016  . Suicidal thoughts   . Substance induced mood disorder (HCC) 03/13/2015  . Acute kidney failure (HCC) 01/26/2015  . Schizophrenia, paranoid type (HCC) 01/17/2015  . Suicidal ideation   . Drug hallucinosis (HCC) 10/08/2014  . Chronic paranoid schizophrenia (HCC) 09/07/2014  . Substance or medication-induced bipolar and  related disorder with onset during intoxication (HCC) 08/10/2014  . Acute CHF (congestive heart failure) (HCC) 07/29/2014  . Cocaine use disorder, severe, dependence (HCC)   . Cocaine abuse (HCC) 03/10/2014  . Essential hypertension, benign 03/28/2013  . Diabetes mellitus (HCC) 03/15/2013    History reviewed. No pertinent surgical history.      Home Medications    Prior to Admission medications   Medication Sig Start Date End Date Taking? Authorizing Provider  acetaminophen (TYLENOL) 500 MG tablet Take 1 tablet (500 mg total) by mouth every 6 (six) hours as needed. Patient not taking: Reported on 12/10/2017 12/07/17   McDonald, Mia A, PA-C  Cetirizine HCl (ZYRTEC ALLERGY) 10 MG CAPS Take 1 capsule (10 mg total) by mouth daily. Patient not taking: Reported on 11/23/2017 10/03/17   Ward, Layla Maw, DO  clotrimazole (LOTRIMIN) 1 % cream Apply to affected area 2 times daily Patient not taking: Reported on 12/18/2017 12/17/17   Roxy Horseman, PA-C  fluticasone [facility]) 50 MCG/ACT nasal spray Place 2 sprays into both nostrils daily. Patient not taking: Reported on 11/23/2017 10/03/17   Ward, Layla Maw, DO  hydrochlorothiazide (HYDRODIURIL) 25 MG tablet Take 1 tablet (25 mg total) by mouth daily. Patient not taking: Reported on 11/23/2017 10/13/17   Ward, Layla Maw, DO  lisinopril (PRINIVIL,ZESTRIL) 40 MG tablet Take 1 tablet (40 mg total) by mouth daily. Patient not taking: Reported on 11/23/2017 10/13/17  Ward, Layla Maw, DO    Family History Family History  Problem Relation Age of Onset  . Hypertension Other   . Diabetes Other     Social History Social History   Tobacco Use  . Smoking status: Current Every Day Smoker    Packs/day: 1.00    Years: 20.00    Pack years: 20.00    Types: Cigarettes  . Smokeless tobacco: Current User  Substance Use Topics  . Alcohol use: Yes    Comment: Daily Drinker   . Drug use: Yes    Frequency: 7.0 times per week    Types: "Crack" cocaine,  Cocaine, Marijuana    Comment: Cocaine tonight, Marijuana "a long time"     Allergies   Haldol [haloperidol]   Review of Systems Review of Systems  Constitutional: Negative for chills and fever.  Respiratory: Negative for cough, chest tightness and shortness of breath.   Cardiovascular: Negative for chest pain, palpitations and leg swelling.  Gastrointestinal: Negative for abdominal distention, abdominal pain, diarrhea, nausea and vomiting.  Genitourinary: Negative for dysuria, frequency, hematuria and urgency.  Musculoskeletal: Negative for arthralgias, myalgias, neck pain and neck stiffness.  Skin: Negative for rash.  Allergic/Immunologic: Negative for immunocompromised state.  Neurological: Negative for dizziness, weakness, light-headedness, numbness and headaches.  Psychiatric/Behavioral: Positive for hallucinations and suicidal ideas. Negative for self-injury.  All other systems reviewed and are negative.    Physical Exam Updated Vital Signs BP (!) 167/97   Pulse 93   Temp 98.3 F (36.8 C) (Oral)   Resp 20   SpO2 99%   Physical Exam  Constitutional: He appears well-developed and well-nourished. No distress.  HENT:  Head: Normocephalic and atraumatic.  Eyes: Conjunctivae are normal.  Neck: Neck supple.  Cardiovascular: Normal rate, regular rhythm and normal heart sounds.  Pulmonary/Chest: Effort normal. No respiratory distress. He has no wheezes. He has no rales.  Abdominal: Soft. Bowel sounds are normal. He exhibits no distension. There is no tenderness. There is no rebound.  Musculoskeletal: He exhibits no edema.  Neurological: He is alert.  Skin: Skin is warm and dry.  Psychiatric: His speech is normal. His affect is angry. He is agitated. Thought content is paranoid.  Nursing note and vitals reviewed.    ED Treatments / Results  Labs (all labs ordered are listed, but only abnormal results are displayed) Labs Reviewed  COMPREHENSIVE METABOLIC PANEL -  Abnormal; Notable for the following components:      Result Value   Glucose, Bld 201 (*)    Total Protein 6.2 (*)    Albumin 3.3 (*)    ALT 13 (*)    Total Bilirubin 0.2 (*)    All other components within normal limits  ACETAMINOPHEN LEVEL - Abnormal; Notable for the following components:   Acetaminophen (Tylenol), Serum <10 (*)    All other components within normal limits  CBC - Abnormal; Notable for the following components:   RBC 3.83 (*)    Hemoglobin 11.0 (*)    HCT 37.0 (*)    MCHC 29.7 (*)    All other components within normal limits  ETHANOL  SALICYLATE LEVEL  RAPID URINE DRUG SCREEN, HOSP PERFORMED    EKG None  Radiology No results found.  Procedures Procedures (including critical care time)  Medications Ordered in ED Medications - No data to display   Initial Impression / Assessment and Plan / ED Course  I have reviewed the triage vital signs and the nursing notes.  Pertinent labs &  imaging results that were available during my care of the patient were reviewed by me and considered in my medical decision making (see chart for details).     Since patient is 11 visit in the last 13 days.  He has been here almost every single day for the last 2 weeks.  He is homeless, and already asked the nurse for some food and something to drink.  He is eating in the room during my examination.  He has no medical complaints.  He states he wants to go to behavioral health, mainly for his drug abuse and to get better on the medications.  I discussed with patient at length that [facility] and will get him again set up with a case manager.  He does have a cousin who has offered him a place to stay with him but patient refuses to stay there.  I also called his contact in the chart under the care plan, Clydie BraunKaren, but care no longer works with him.  At this time, patient is low risk for SI, and stable for discharge  home.  Vitals:   12/21/17 2130 12/21/17 2321  BP: (!) 167/97 (!) 161/90  Pulse: 93 96  Resp: 20   Temp: 98.3 F (36.8 C) 98.4 F (36.9 C)  TempSrc: Oral Oral  SpO2: 99% 92%    Final Clinical Impressions(s) / ED Diagnoses   Final diagnoses:  Schizophrenia, unspecified type [facility](HCC)    ED Discharge Orders    None       Jaynie CrumbleKirichenko, Nieves Barberi, PA-C 12/22/17 0441    Nira Connardama, Pedro Eduardo, MD 12/22/17 [phone]

## 2017-12-21 NOTE — ED Triage Notes (Signed)
Pt states that he is feeling suicidal and having thoughts of jumping off a bridge, reports seeing demons and hearing voices. Reports he wants his psych medications

## 2017-12-21 NOTE — Discharge Instructions (Signed)
Please go to [facility] tomorrow to get your meds filled

## 2017-12-21 NOTE — ED Provider Notes (Signed)
[facility] Provider Note   CSN: [id number] Arrival date & time: 12/21/17  0034     History   Chief Complaint Chief Complaint  Patient presents with  . wants diabetes checked    HPI [name] is a 57 y.o. male.  HPI  Pt is a 57 yo males presenting to ER requesting blood work to be completed without any complaints. Homeless. Seen last night and wanted a sandwich.  Hx of polysubstance abuse and schizophrenia. No other complaints at this time  Past Medical History:  Diagnosis Date  . CHF (congestive heart failure) (HCC)   . Chronic foot pain   . Cocaine abuse (HCC)   . Diabetes mellitus without complication (HCC)   . Hepatitis C   . Homelessness   . Homelessness   . Hypertension   . Neuropathy   . Polysubstance abuse (HCC)   . Schizophrenia [facility](HCC)     Patient Active Problem List   Diagnosis Date Noted  . Cocaine abuse with cocaine-induced mood disorder (HCC) 09/18/2017  . Chronic foot pain   . Schizoaffective disorder, bipolar type (HCC) 09/30/2016  . Suicidal thoughts   . Substance induced mood disorder (HCC) 03/13/2015  . Acute kidney failure (HCC) 01/26/2015  . Schizophrenia, paranoid type (HCC) 01/17/2015  . Suicidal ideation   . Drug hallucinosis (HCC) 10/08/2014  . Chronic paranoid schizophrenia (HCC) 09/07/2014  . 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 Acute CHF (congestive heart failure) (HCC) 07/29/2014  . Cocaine use disorder, severe, dependence (HCC)   . Cocaine abuse (HCC) 03/10/2014  . Essential hypertension, benign 03/28/2013  . Diabetes mellitus (HCC) 03/15/2013    History reviewed. No pertinent surgical history.      Home Medications    Prior to Admission medications   Medication Sig Start Date End Date Taking? Authorizing Provider  acetaminophen (TYLENOL) 500 MG tablet Take 1 tablet (500 mg total) by mouth every 6  (six) hours as needed. Patient not taking: Reported on 12/10/2017 12/07/17   McDonald, Mia A, PA-C  ARIPiprazole (ABILIFY) 10 MG tablet Take 10 mg by mouth daily.     [provider]  benztropine (COGENTIN) 1 MG tablet Take 1 mg by mouth daily.    [provider]  carbamazepine (TEGRETOL) 200 MG tablet Take 200 mg by mouth daily.     [provider]  Cetirizine HCl (ZYRTEC ALLERGY) 10 MG CAPS Take 1 capsule (10 mg total) by mouth daily. Patient not taking: Reported on 11/23/2017 10/03/17   Ward, Layla MawKristen N, DO  clotrimazole (LOTRIMIN) 1 % cream Apply to affected area 2 times daily Patient not taking: Reported on 12/18/2017 12/17/17   Roxy HorsemanBrowning, Robert, PA-C  fluticasone [facility](FLONASE) 50 MCG/ACT nasal spray Place 2 sprays into both nostrils daily. Patient not taking: Reported on 11/23/2017 10/03/17   Ward, Layla MawKristen N, DO  hydrochlorothiazide (HYDRODIURIL) 25 MG tablet Take 1 tablet (25 mg total) by mouth daily. Patient not taking: Reported on 11/23/2017 10/13/17   Ward, Layla MawKristen N, DO  hydrOXYzine (ATARAX/VISTARIL) 50 MG tablet Take 50 mg by mouth 2 (two) times daily.    [provider]  lisinopril (PRINIVIL,ZESTRIL) 40 MG tablet Take 1 tablet (40 mg total) by mouth daily. Patient not taking: Reported on 11/23/2017 10/13/17   Ward, Layla MawKristen N, DO    Family History Family History  Problem Relation Age of Onset  . Hypertension Other   . Diabetes Other  Social History Social History   Tobacco Use  . Smoking status: Current Every Day Smoker    Packs/day: 1.00    Years: 20.00    Pack years: 20.00    Types: Cigarettes  . Smokeless tobacco: Current User  Substance Use Topics  . Alcohol use: Yes    Comment: Daily Drinker   . Drug use: Yes    Frequency: 7.0 times per week    Types: "Crack" cocaine, Cocaine, Marijuana    Comment: Cocaine tonight, Marijuana "a long time"     Allergies   Haldol [haloperidol]   Review of Systems Review of Systems  All other systems  reviewed and are negative.    Physical Exam Updated Vital Signs BP (!) 161/94 (BP Location: Right Arm)   Pulse 98   Temp 98.1 F (36.7 C) (Oral)   Resp 16   Ht 5\' 9"  (1.753 m)   Wt 74.8 kg (165 lb)   SpO2 99%   BMI 24.37 kg/m   Physical Exam  Constitutional: He is oriented to person, place, and time. He appears well-developed and well-nourished.  HENT:  Head: Normocephalic.  Eyes: EOM are normal.  Neck: Normal range of motion.  Pulmonary/Chest: Effort normal.  Abdominal: He exhibits no distension.  Musculoskeletal: Normal range of motion.  Neurological: He is alert and oriented to person, place, and time.  Psychiatric: He has a normal mood and affect.  Nursing note and vitals reviewed.    ED Treatments / Results  Labs (all labs ordered are listed, but only abnormal results are displayed) Labs Reviewed - No data to display  EKG None  Radiology Dg Chest 2 View  Result Date: 12/19/2017 CLINICAL DATA:  Chest pain EXAM: CHEST - 2 VIEW COMPARISON:  12/16/2017, 10/03/2017 FINDINGS: No acute airspace disease or pleural effusion. Normal heart size. Aortic atherosclerosis. No pneumothorax. Metallic opacity over the right anterior lower chest wall. IMPRESSION: No active cardiopulmonary disease. Electronically Signed   By: [name]   On: 12/19/2017 01:53    Procedures Procedures (including critical care time)  Medications Ordered in ED Medications - No data to display   Initial Impression / Assessment and Plan / ED Course  I have reviewed the triage vital signs and the nursing notes.  Pertinent labs & imaging results that were available during my care of the patient were reviewed by me and considered in my medical decision making (see chart for details).     Medical screening exam complete. No indication for workup in the ER vitals are stable  Final Clinical Impressions(s) / ED Diagnoses   Final diagnoses:  None    ED Discharge Orders    None        Azalia Bilis, MD 12/21/17 [phone]

## 2017-12-21 NOTE — ED Notes (Signed)
Dr. Patria Maneampos in to see patient, pt became very argumentative with staff, yelling "get out of my face"

## 2017-12-23 ENCOUNTER — Emergency Department (HOSPITAL_COMMUNITY)
Admission: EM | Admit: 2017-12-23 | Discharge: 2017-12-23 | Discharge status: Against Medical Advice | Payer: Medicare Other

## 2017-12-23 NOTE — ED Notes (Signed)
Pt visualized running out of ED after being given change of pants and a sandwich by tech first. LWBS before triage.

## 2017-12-24 ENCOUNTER — Other Ambulatory Visit: Payer: Self-pay

## 2017-12-24 ENCOUNTER — Emergency Department (HOSPITAL_COMMUNITY)
Admission: EM | Admit: 2017-12-24 | Discharge: 2017-12-24 | Disposition: A | Discharge status: Home / Self Care | Payer: Medicaid Other | Attending: Emergency Medicine | Admitting: Emergency Medicine

## 2017-12-24 ENCOUNTER — Encounter (HOSPITAL_COMMUNITY): Payer: Self-pay | Admitting: Emergency Medicine

## 2017-12-24 DIAGNOSIS — F17210 Nicotine dependence, cigarettes, uncomplicated: Secondary | ICD-10-CM | POA: Insufficient documentation

## 2017-12-24 DIAGNOSIS — I509 Heart failure, unspecified: Secondary | ICD-10-CM | POA: Insufficient documentation

## 2017-12-24 DIAGNOSIS — E1140 Type 2 diabetes mellitus with diabetic neuropathy, unspecified: Secondary | ICD-10-CM | POA: Insufficient documentation

## 2017-12-24 DIAGNOSIS — I110 Hypertensive heart disease with heart failure: Secondary | ICD-10-CM | POA: Diagnosis not present

## 2017-12-24 DIAGNOSIS — J029 Acute pharyngitis, unspecified: Secondary | ICD-10-CM | POA: Diagnosis not present

## 2017-12-24 DIAGNOSIS — R070 Pain in throat: Secondary | ICD-10-CM | POA: Diagnosis present

## 2017-12-24 LAB — CBC WITH DIFFERENTIAL/PLATELET
Abs Immature Granulocytes: 0 10*3/uL (ref 0.0–0.1)
BASOS ABS: 0 10*3/uL (ref 0.0–0.1)
BASOS PCT: 1 %
EOS ABS: 0.1 10*3/uL (ref 0.0–0.7)
Eosinophils Relative: 2 %
HCT: 36.5 % — ABNORMAL LOW (ref 39.0–52.0)
Hemoglobin: 11 g/dL — ABNORMAL LOW (ref 13.0–17.0)
IMMATURE GRANULOCYTES: 0 %
Lymphocytes Relative: 20 %
Lymphs Abs: 1.1 10*3/uL (ref 0.7–4.0)
MCH: 28.4 pg (ref 26.0–34.0)
MCHC: 30.1 g/dL (ref 30.0–36.0)
MCV: 94.3 fL (ref 78.0–100.0)
Monocytes Absolute: 0.5 10*3/uL (ref 0.1–1.0)
Monocytes Relative: 8 %
NEUTROS ABS: 3.8 10*3/uL (ref 1.7–7.7)
NEUTROS PCT: 69 %
PLATELETS: 181 10*3/uL (ref 150–400)
RBC: 3.87 MIL/uL — ABNORMAL LOW (ref 4.22–5.81)
RDW: 14.2 % (ref 11.5–15.5)
WBC: 5.6 10*3/uL (ref 4.0–10.5)

## 2017-12-24 LAB — COMPREHENSIVE METABOLIC PANEL
ALT: 18 U/L (ref 0–44)
AST: 22 U/L (ref 15–41)
Albumin: 3.1 g/dL — ABNORMAL LOW (ref 3.5–5.0)
Alkaline Phosphatase: 80 U/L (ref 38–126)
Anion gap: 6 (ref 5–15)
BUN: 11 mg/dL (ref 6–20)
CALCIUM: 9 mg/dL (ref 8.9–10.3)
CO2: 26 mmol/L (ref 22–32)
Chloride: 110 mmol/L (ref 98–111)
Creatinine, Ser: 0.92 mg/dL (ref 0.61–1.24)
Glucose, Bld: 143 mg/dL — ABNORMAL HIGH (ref 70–99)
Potassium: 3.5 mmol/L (ref 3.5–5.1)
SODIUM: 142 mmol/L (ref 135–145)
TOTAL PROTEIN: 5.7 g/dL — AB (ref 6.5–8.1)
Total Bilirubin: 0.7 mg/dL (ref 0.3–1.2)

## 2017-12-24 LAB — LIPASE, BLOOD: Lipase: 26 U/L (ref 11–51)

## 2017-12-24 LAB — GROUP A STREP BY PCR: GROUP A STREP BY PCR: NOT DETECTED

## 2017-12-24 NOTE — ED Provider Notes (Signed)
[name] EMERGENCY DEPARTMENT Provider Note   CSN: [id number] Arrival date & time: 12/24/17  0119     History   Chief Complaint Chief Complaint  Patient presents with  . Abdominal Pain    HPI [name] is a 57 y.o. male.  Patient well-known to the ED with a history of homelessness, schizophrenia, substance abuse and diabetes presenting with abdominal pain.  Patient is sleeping comfortably in the hallway on initial assessment.  On arousing him, he states he is here because his throat hurts.  He states his throat is been hurting for the past 2 days.  Denies any fevers, chills, nausea or vomiting.  He had some abdominal pain earlier which she thinks is from eating food from the trash but this is feeling better.  No diarrhea.  No pain with urination or blood in the urine.  Patient found to have 450 cc in his bladder but has consistently refused Foley catheter placement in the past.   Abdominal Pain   Pertinent negatives include diarrhea, nausea, vomiting, dysuria, hematuria, headaches, arthralgias and myalgias.    Past Medical History:  Diagnosis Date  . CHF (congestive heart failure) (HCC)   . Chronic foot pain   . Cocaine abuse (HCC)   . Diabetes mellitus without complication (HCC)   . Hepatitis C   . Homelessness   . Homelessness   . Hypertension   . Neuropathy   . Polysubstance abuse (HCC)   . Schizophrenia [facility])     Patient Active Problem List   Diagnosis Date Noted  . Cocaine abuse with cocaine-induced mood disorder (HCC) 09/18/2017  . Chronic foot pain   . Schizoaffective disorder, bipolar type (HCC) 09/30/2016  . Suicidal thoughts   . Substance induced mood disorder (HCC) 03/13/2015  . Acute kidney failure (HCC) 01/26/2015  . Schizophrenia, paranoid type (HCC) 01/17/2015  . Suicidal ideation   . Drug hallucinosis (HCC) 10/08/2014  . Chronic paranoid schizophrenia (HCC) 09/07/2014  . Substance or medication-induced bipolar and  related disorder with onset during intoxication (HCC) 08/10/2014  . Acute CHF (congestive heart failure) (HCC) 07/29/2014  . Cocaine use disorder, severe, dependence (HCC)   . Cocaine abuse (HCC) 03/10/2014  . Essential hypertension, benign 03/28/2013  . Diabetes mellitus (HCC) 03/15/2013    History reviewed. No pertinent surgical history.      Home Medications    Prior to Admission medications   Medication Sig Start Date End Date Taking? Authorizing Provider  acetaminophen (TYLENOL) 500 MG tablet Take 1 tablet (500 mg total) by mouth every 6 (six) hours as needed. Patient not taking: Reported on 12/10/2017 12/07/17   McDonald, Mia A, PA-C  Cetirizine HCl (ZYRTEC ALLERGY) 10 MG CAPS Take 1 capsule (10 mg total) by mouth daily. Patient not taking: Reported on 11/23/2017 10/03/17   Ward, Layla Maw, DO  clotrimazole (LOTRIMIN) 1 % cream Apply to affected area 2 times daily Patient not taking: Reported on 12/18/2017 12/17/17   Roxy Horseman, PA-C  fluticasone [facility]) 50 MCG/ACT nasal spray Place 2 sprays into both nostrils daily. Patient not taking: Reported on 11/23/2017 10/03/17   Ward, Layla Maw, DO  hydrochlorothiazide (HYDRODIURIL) 25 MG tablet Take 1 tablet (25 mg total) by mouth daily. Patient not taking: Reported on 11/23/2017 10/13/17   Ward, Layla Maw, DO  lisinopril (PRINIVIL,ZESTRIL) 40 MG tablet Take 1 tablet (40 mg total) by mouth daily. Patient not taking: Reported on 11/23/2017 10/13/17   Ward, Layla Maw, DO    Family History  Family History  Problem Relation Age of Onset  . Hypertension Other   . Diabetes Other     Social History Social History   Tobacco Use  . Smoking status: Current Every Day Smoker    Packs/day: 1.00    Years: 20.00    Pack years: 20.00    Types: Cigarettes  . Smokeless tobacco: Current User  Substance Use Topics  . Alcohol use: Yes    Comment: Daily Drinker   . Drug use: Yes    Frequency: 7.0 times per week    Types: "Crack" cocaine,  Cocaine, Marijuana    Comment: Cocaine tonight, Marijuana "a long time"     Allergies   Haldol [haloperidol]   Review of Systems Review of Systems  Constitutional: Negative for activity change and appetite change.  HENT: Positive for sore throat and trouble swallowing. Negative for congestion.   Respiratory: Negative for chest tightness.   Gastrointestinal: Positive for abdominal pain. Negative for diarrhea, nausea and vomiting.  Genitourinary: Negative for dysuria and hematuria.  Musculoskeletal: Negative for arthralgias and myalgias.  Skin: Negative for rash.  Neurological: Negative for dizziness, weakness and headaches.   all other systems are negative except as noted in the HPI and PMH.     Physical Exam Updated Vital Signs BP (!) 169/72 (BP Location: Right Arm)   Pulse 68   Temp 98.4 F (36.9 C) (Oral)   Resp 18   SpO2 94%   Physical Exam  Constitutional: He is oriented to person, place, and time. He appears well-developed and well-nourished. No distress.  disheveled  HENT:  Head: Normocephalic and atraumatic.  Mouth/Throat: Oropharynx is clear and moist. No oropharyngeal exudate.  Oropharynx is clear without exudate or asymmetry  Eyes: Pupils are equal, round, and reactive to light. Conjunctivae and EOM are normal.  Neck: Normal range of motion. Neck supple.  No meningismus.  Cardiovascular: Normal rate, regular rhythm, normal heart sounds and intact distal pulses.  No murmur heard. Pulmonary/Chest: Effort normal and breath sounds normal. No respiratory distress.  Abdominal: Soft. There is tenderness. There is no rebound and no guarding.  Palpable bladder, mild diffuse tenderness  Musculoskeletal: Normal range of motion. He exhibits no edema or tenderness.  Neurological: He is alert and oriented to person, place, and time. No cranial nerve deficit. He exhibits normal muscle tone. Coordination normal.  No ataxia on finger to nose bilaterally. No pronator drift.  5/5 strength throughout. CN 2-12 intact.Equal grip strength. Sensation intact.   Skin: Skin is warm.  Psychiatric: He has a normal mood and affect. His behavior is normal.  Nursing note and vitals reviewed.    ED Treatments / Results  Labs (all labs ordered are listed, but only abnormal results are displayed) Labs Reviewed  CBC WITH DIFFERENTIAL/PLATELET - Abnormal; Notable for the following components:      Result Value   RBC 3.87 (*)    Hemoglobin 11.0 (*)    HCT 36.5 (*)    All other components within normal limits  COMPREHENSIVE METABOLIC PANEL - Abnormal; Notable for the following components:   Glucose, Bld 143 (*)    Total Protein 5.7 (*)    Albumin 3.1 (*)    All other components within normal limits  GROUP A STREP BY PCR  LIPASE, BLOOD    EKG None  Radiology No results found.  Procedures Procedures (including critical care time)  Medications Ordered in ED Medications - No data to display   Initial Impression / Assessment and Plan /  ED Course  I have reviewed the triage vital signs and the nursing notes.  Pertinent labs & imaging results that were available during my care of the patient were reviewed by me and considered in my medical decision making (see chart for details).    Patient well-known to the ED presenting with throat pain which is in contrast to what he told the triage nurse.  He denies any chest pain or shortness of breath.  His abdomen is soft with mild diffuse tenderness.  He has a distended bladder again but is able to urinate and refuses a Foley catheter as per usual.  His labs are reassuring. Rapid strep test is negative.  Patient has been sleeping the majority of his ED visit with stable vital signs and appears appropriate for discharge.  Final Clinical Impressions(s) / ED Diagnoses   Final diagnoses:  Pharyngitis, unspecified etiology    ED Discharge Orders    None       Sohrab Keelan, Jeannett SeniorStephen, MD 12/24/17 [phone]

## 2017-12-24 NOTE — ED Notes (Addendum)
E signature pad unavailable. Patient verbalizes understanding of discharge instructions. Opportunity for questioning and answers were provided. Armband removed by staff, pt discharged from ED.  

## 2017-12-24 NOTE — ED Notes (Signed)
ED Provider at bedside. 

## 2017-12-24 NOTE — Discharge Instructions (Addendum)
Your labs are reassuring and your strep test is negative.  Follow-up with your doctor.  Return to the ED if you develop new or worsening symptoms.

## 2017-12-24 NOTE — ED Triage Notes (Signed)
Pt states he is homeless and ate something from the trash that just makes his stomach not feel right.  Given food and clean scrubs and socks. Pt states he will feel better when he eats right.

## 2017-12-25 ENCOUNTER — Other Ambulatory Visit: Payer: Self-pay

## 2017-12-25 ENCOUNTER — Encounter (HOSPITAL_COMMUNITY): Payer: Self-pay

## 2017-12-25 ENCOUNTER — Emergency Department (HOSPITAL_COMMUNITY)
Admission: EM | Admit: 2017-12-25 | Discharge: 2017-12-26 | Disposition: A | Discharge status: Home / Self Care | Payer: Medicaid Other | Attending: Emergency Medicine | Admitting: Emergency Medicine

## 2017-12-25 DIAGNOSIS — F1414 Cocaine abuse with cocaine-induced mood disorder: Secondary | ICD-10-CM | POA: Insufficient documentation

## 2017-12-25 DIAGNOSIS — I509 Heart failure, unspecified: Secondary | ICD-10-CM | POA: Diagnosis not present

## 2017-12-25 DIAGNOSIS — I10 Essential (primary) hypertension: Secondary | ICD-10-CM | POA: Insufficient documentation

## 2017-12-25 DIAGNOSIS — R45851 Suicidal ideations: Secondary | ICD-10-CM | POA: Diagnosis not present

## 2017-12-25 DIAGNOSIS — F329 Major depressive disorder, single episode, unspecified: Secondary | ICD-10-CM | POA: Diagnosis present

## 2017-12-25 DIAGNOSIS — F17210 Nicotine dependence, cigarettes, uncomplicated: Secondary | ICD-10-CM | POA: Diagnosis not present

## 2017-12-25 DIAGNOSIS — E119 Type 2 diabetes mellitus without complications: Secondary | ICD-10-CM | POA: Diagnosis not present

## 2017-12-25 DIAGNOSIS — F1420 Cocaine dependence, uncomplicated: Secondary | ICD-10-CM | POA: Diagnosis present

## 2017-12-25 NOTE — ED Triage Notes (Signed)
Pt dropped off by bystander that reports seeing him on a bridge. Pt reports that he was going to jump.

## 2017-12-25 NOTE — ED Notes (Signed)
[name] [phone] found pt standing on bridge . Pt stated he was going to jump off so bystander brought him here.

## 2017-12-25 NOTE — ED Notes (Signed)
Pt presents with a vanilla milkshake and asking for a blanket.

## 2017-12-26 LAB — CBC
HEMATOCRIT: 35.9 % — AB (ref 39.0–52.0)
HEMOGLOBIN: 11 g/dL — AB (ref 13.0–17.0)
MCH: 28.6 pg (ref 26.0–34.0)
MCHC: 30.6 g/dL (ref 30.0–36.0)
MCV: 93.2 fL (ref 78.0–100.0)
Platelets: 191 10*3/uL (ref 150–400)
RBC: 3.85 MIL/uL — AB (ref 4.22–5.81)
RDW: 14.6 % (ref 11.5–15.5)
WBC: 5.8 10*3/uL (ref 4.0–10.5)

## 2017-12-26 LAB — COMPREHENSIVE METABOLIC PANEL
ALK PHOS: 84 U/L (ref 38–126)
ALT: 16 U/L (ref 0–44)
AST: 17 U/L (ref 15–41)
Albumin: 3.2 g/dL — ABNORMAL LOW (ref 3.5–5.0)
Anion gap: 6 (ref 5–15)
BILIRUBIN TOTAL: 0.6 mg/dL (ref 0.3–1.2)
BUN: 17 mg/dL (ref 6–20)
CALCIUM: 9.3 mg/dL (ref 8.9–10.3)
CO2: 30 mmol/L (ref 22–32)
Chloride: 111 mmol/L (ref 98–111)
Creatinine, Ser: 0.94 mg/dL (ref 0.61–1.24)
Glucose, Bld: 143 mg/dL — ABNORMAL HIGH (ref 70–99)
Potassium: 3.9 mmol/L (ref 3.5–5.1)
Sodium: 147 mmol/L — ABNORMAL HIGH (ref 135–145)
TOTAL PROTEIN: 6.2 g/dL — AB (ref 6.5–8.1)

## 2017-12-26 LAB — RAPID URINE DRUG SCREEN, HOSP PERFORMED
AMPHETAMINES: NOT DETECTED
BENZODIAZEPINES: NOT DETECTED
Cocaine: POSITIVE — AB
Opiates: NOT DETECTED
TETRAHYDROCANNABINOL: NOT DETECTED

## 2017-12-26 LAB — ACETAMINOPHEN LEVEL: Acetaminophen (Tylenol), Serum: <10 ug/mL — ABNORMAL LOW (ref 10–30)

## 2017-12-26 LAB — ETHANOL

## 2017-12-26 LAB — SALICYLATE LEVEL

## 2017-12-26 NOTE — ED Notes (Signed)
Patient reports SI with a plan to jump off bridge. Plan of care discussed. Encouragement and support provided and safety maintain. Q 15 min safety checks in place and video monitoring.

## 2017-12-26 NOTE — Progress Notes (Signed)
CSW received consult for patient no longer having Care Coordination through [facility]. Per chart review, care coordinator Clydie BraunKaren, is no longer active with patient. CSW reached out to [place] with [facility] to determine if patient had new care coordinator/ why he is no longer receiving services. Per [facility], patient was not engaging in services provided and discharged patient from service. Patient no longer receives services from [facility].   Stacy GardnerErin Christabelle Hanzlik, LCSW Emergency Room Clinical Social Worker [phone]

## 2017-12-26 NOTE — BH Assessment (Signed)
Assessment Note  [name] is an 57 y.o. male.  -Clinician reviewed note by Sharilyn Sites, PA.  Patient was standing on the edge of the bridge and voice that he was going to jump off when a bystander saw him.  He assisted him down and drove him to the ER for evaluation.  Patient remains adamant that as soon as he leaves here he will kill himself "by any means possible".  He denies any homicidal ideation.  No hallucinations.  Patient is laying down and does respond to most questions.  He says he was going to jump from a bridge over [employer] when a stranger stopped him.  This stranger brought him to [facility] for help.  Patient still endorses wanting to kill himself.  He says he would jump from a bridge or do something else fatal.  Patient denies any HI or current auditory hallucinations.  Patient uses cocaine quite regularly.   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    Pt says he does not go to [place] because he uses street drugs instead.  Patient's last inpatient care was 12/2014.  When asked if he felt like he would be okay upon discharge he says "yes, I can be."  -Clinician discussed patient care with Nira Conn, FNP.  He recommended patient be observed and reviewed by psychiatry in AM.    Diagnosis: F20.9 Schizophrenia; F14.20 Cocaine use d/o severe  Past Medical History:  Past Medical History:  Diagnosis Date  . CHF (congestive heart failure) (HCC)   . Chronic foot pain   . Cocaine abuse (HCC)   . Diabetes mellitus without complication (HCC)   . Hepatitis C   . Homelessness   . Homelessness   . Hypertension   . Neuropathy   . Polysubstance abuse (HCC)   . Schizophrenia (HCC)     History reviewed. No pertinent surgical history.  Family History:  Family History  Problem Relation Age of Onset  . Hypertension Other   . Diabetes Other     Social History:  reports that he has been smoking cigarettes.  He has a 20.00 pack-year smoking history. He uses  smokeless tobacco. He reports that he drinks alcohol. He reports that he has current or past drug history. Drugs: "Crack" cocaine, Cocaine, and Marijuana. Frequency: 7.00 times per week.  Additional Social History:  Alcohol / Drug Use Pain Medications: None Prescriptions: None Over the Counter: None History of alcohol / drug use?: Yes Substance #1 Name of Substance 1: Cocaine (crack) 1 - Age of First Use: Unknown 1 - Amount (size/oz): Varies according to resources 1 - Frequency: As often as possible 1 - Duration: on-going  1 - Last Use / Amount: 06/26  CIWA: CIWA-Ar BP: (!) 157/77 Pulse Rate: 88 COWS:    Allergies:  Allergies  Allergen Reactions  . Haldol [Haloperidol] Other (See Comments)    Muscle spasms, loss of voluntary movement. However, pt has taken Thorazine on multiple occasions with no adverse effects.     Home Medications:  (Not in a hospital admission)  OB/GYN Status:  No LMP for male patient.  General Assessment Data [facility] of Assessment: [facility] TTS Assessment: In system Is this a Tele or Face-to-Face Assessment?: Face-to-Face Is this an Initial Assessment or a Re-assessment for this encounter?: Initial Assessment Marital status: Single Is patient pregnant?: No Pregnancy Status: No Living Arrangements: Other (Comment)(Homeless) Can pt return to current living arrangement?: Yes Admission Status: Voluntary Is patient capable of signing  voluntary admission?: Yes Referral Source: Self/Family/Friend(Complete stranger brought him in.) Insurance type: self pay     Crisis Care Plan Living Arrangements: Other (Comment)(Homeless) Name of Psychiatrist: None Name of Therapist: None  Education Status Is patient currently in school?: No Is the patient employed, unemployed or receiving disability?: Unemployed  Risk to self with the past 6 months Suicidal Ideation: Yes-Currently Present Has patient been a risk to self within the past 6 months prior to  admission? : No Suicidal Intent: Yes-Currently Present Has patient had any suicidal intent within the past 6 months prior to admission? : No Is patient at risk for suicide?: Yes Suicidal Plan?: Yes-Currently Present Has patient had any suicidal plan within the past 6 months prior to admission? : No Specify Current Suicidal Plan: Jump from a bridge Access to Means: Yes Specify Access to Suicidal Means: Walking and jumping off a bridge What has been your use of drugs/alcohol within the last 12 months?: Cocaine Previous Attempts/Gestures: Yes How many times?: (Unknown) Other Self Harm Risks: SA issues Triggers for Past Attempts: Unpredictable Intentional Self Injurious Behavior: None Family Suicide History: No Recent stressful life event(s): Other (Comment)(Homelessness) Persecutory voices/beliefs?: Yes Depression: Yes Depression Symptoms: Despondent, Isolating, Loss of interest in usual pleasures, Feeling worthless/self pity Substance abuse history and/or treatment for substance abuse?: Yes Suicide prevention information given to non-admitted patients: Not applicable  Risk to Others within the past 6 months Homicidal Ideation: No Does patient have any lifetime risk of violence toward others beyond the six months prior to admission? : Unknown Thoughts of Harm to Others: No Current Homicidal Intent: No Current Homicidal Plan: No Access to Homicidal Means: No Identified Victim: No one History of harm to others?: No Assessment of Violence: None Noted Violent Behavior Description: None reported Does patient have access to weapons?: No Criminal Charges Pending?: No Does patient have a court date: No Court Date: (Unknown) Is patient on probation?: Unknown  Psychosis Hallucinations: None noted(Pt denying at this time.) Delusions: None noted  Mental Status Report Appearance/Hygiene: Body odor, Disheveled, In scrubs Eye Contact: Poor Motor Activity: Freedom of movement,  Unremarkable Speech: Logical/coherent, Rapid Level of Consciousness: Drowsy Mood: Anxious, Helpless Affect: Anxious, Depressed Anxiety Level: Moderate Thought Processes: Coherent, Relevant Judgement: Impaired Orientation: Person, Place, Situation Obsessive Compulsive Thoughts/Behaviors: None  Cognitive Functioning Concentration: Decreased Memory: Recent Impaired, Remote Impaired Is patient IDD: No Is patient DD?: Yes Insight: Poor Impulse Control: Poor Appetite: Poor Have you had any weight changes? : No Change Sleep: Decreased Total Hours of Sleep: (<4H/D) Vegetative Symptoms: Decreased grooming  ADLScreening [facility] Assessment Services) Patient's cognitive ability adequate to safely complete daily activities?: Yes Patient able to express need for assistance with ADLs?: Yes Independently performs ADLs?: Yes (appropriate for developmental age)  Prior Inpatient Therapy Prior Inpatient Therapy: Yes Prior Therapy Dates: 12/2014 Prior [facility] Reason for Treatment: schizophrenia  Prior Outpatient Therapy Prior Outpatient Therapy: No Does patient have an ACCT team?: No Does patient have Intensive In-House Services?  : No Does patient have [name] services? : No Does patient have P4CC services?: No  ADL Screening (condition at time of admission) Patient's cognitive ability adequate to safely complete daily activities?: Yes Is the patient deaf or have difficulty hearing?: No Does the patient have difficulty seeing, even when wearing glasses/contacts?: No Does the patient have difficulty concentrating, remembering, or making decisions?: No Patient able to express need for assistance with ADLs?: Yes Does the patient have difficulty dressing or bathing?: No Independently performs ADLs?: Yes (appropriate  for developmental age) Does the patient have difficulty walking or climbing stairs?: No Weakness of Legs: None Weakness of Arms/Hands: None        Abuse/Neglect Assessment (Assessment to be complete while patient is alone) Physical Abuse: Denies Verbal Abuse: Denies Sexual Abuse: Denies Exploitation of patient/patient's resources: Denies Self-Neglect: Denies     Merchant navy officer (For Healthcare) Does Patient Have a Medical Advance Directive?: No Would patient like information on creating a medical advance directive?: No - Patient declined          Disposition:  Disposition Initial Assessment Completed for this Encounter: Yes Patient referred to: Other (Comment)(AM psychiatric evaluation.)  On Site Evaluation by:   Reviewed with Physician:    Alexandria Lodge 12/26/2017 6:14 AM

## 2017-12-26 NOTE — ED Provider Notes (Signed)
[facility]-EMERGENCY DEPT Provider Note   CSN: [id number] Arrival date & time: 12/25/17  2342     History   Chief Complaint Chief Complaint  Patient presents with  . Suicidal    HPI [name] is a 57 y.o. male.  The history is provided by the patient and medical records.     57 year old male with history of chronic foot pain, cocaine abuse, diabetes, hepatitis C, hypertension, neuropathy, schizophrenia, polysubstance abuse, presenting to the ED with suicidal ideation.  Patient was standing on the edge of the bridge and voice that he was going to jump off when a bystander saw him.  He assisted him down and drove him to the ER for evaluation.  Patient remains adamant that as soon as he leaves here he will kill himself "by any means possible".  He denies any homicidal ideation.  No hallucinations.  As per recent ED visit notes, patient is no longer in contact with his care coordinator, Clydie Braun.  He was told at last visit to [facility].  Patient reports to me that "he does not know what to do" and wants help.  Past Medical History:  Diagnosis Date  . CHF (congestive heart failure) (HCC)   . Chronic foot pain   . Cocaine abuse (HCC)   . Diabetes mellitus without complication (HCC)   . Hepatitis C   . Homelessness   . Homelessness   . Hypertension   . Neuropathy   . Polysubstance abuse (HCC)   . Schizophrenia [facility])     Patient Active Problem List   Diagnosis Date Noted  . Cocaine abuse with cocaine-induced mood disorder (HCC) 09/18/2017  . Chronic foot pain   . Schizoaffective disorder, bipolar type (HCC) 09/30/2016  . Suicidal thoughts   . Substance induced mood disorder (HCC) 03/13/2015  . Acute kidney failure (HCC) 01/26/2015  . Schizophrenia, paranoid type (HCC) 01/17/2015  . Suicidal ideation   . Drug hallucinosis (HCC) 10/08/2014  . Chronic paranoid schizophrenia (HCC) 09/07/2014  . Substance or  medication-induced bipolar and related disorder with onset during intoxication (HCC) 08/10/2014  . Acute CHF (congestive heart failure) (HCC) 07/29/2014  . Cocaine use disorder, severe, dependence (HCC)   . Cocaine abuse (HCC) 03/10/2014  . Essential hypertension, benign 03/28/2013  . Diabetes mellitus (HCC) 03/15/2013    History reviewed. No pertinent surgical history.      Home Medications    Prior to Admission medications   Medication Sig Start Date End Date Taking? Authorizing Provider  acetaminophen (TYLENOL) 500 MG tablet Take 1 tablet (500 mg total) by mouth every 6 (six) hours as needed. Patient not taking: Reported on 12/10/2017 12/07/17   McDonald, Mia A, PA-C  Cetirizine HCl (ZYRTEC ALLERGY) 10 MG CAPS Take 1 capsule (10 mg total) by mouth daily. Patient not taking: Reported on 11/23/2017 10/03/17   Ward, Layla Maw, DO  clotrimazole (LOTRIMIN) 1 % cream Apply to affected area 2 times daily Patient not taking: Reported on 12/18/2017 12/17/17   Roxy Horseman, PA-C  fluticasone [facility]) 50 MCG/ACT nasal spray Place 2 sprays into both nostrils daily. Patient not taking: Reported on 11/23/2017 10/03/17   Ward, Layla Maw, DO  hydrochlorothiazide (HYDRODIURIL) 25 MG tablet Take 1 tablet (25 mg total) by mouth daily. Patient not taking: Reported on 11/23/2017 10/13/17   Ward, Layla Maw, DO  lisinopril (PRINIVIL,ZESTRIL) 40 MG tablet Take 1 tablet (40 mg total) by mouth daily. Patient not taking: Reported on 11/23/2017 10/13/17  Ward, Layla Maw, DO    Family History Family History  Problem Relation Age of Onset  . Hypertension Other   . Diabetes Other     Social History Social History   Tobacco Use  . Smoking status: Current Every Day Smoker    Packs/day: 1.00    Years: 20.00    Pack years: 20.00    Types: Cigarettes  . Smokeless tobacco: Current User  Substance Use Topics  . Alcohol use: Yes    Comment: Daily Drinker   . Drug use: Yes    Frequency: 7.0 times per week     Types: "Crack" cocaine, Cocaine, Marijuana    Comment: Cocaine tonight, Marijuana "a long time"     Allergies   Haldol [haloperidol]   Review of Systems Review of Systems  Psychiatric/Behavioral: Positive for suicidal ideas.  All other systems reviewed and are negative.    Physical Exam Updated Vital Signs BP (!) 176/105   Pulse 89   Temp (!) 97.4 F (36.3 C) (Axillary) Comment: pt drinking a milkshake- tolerating PO well  Resp 14   SpO2 98%   Physical Exam  Constitutional: He is oriented to person, place, and time. He appears well-developed and well-nourished.  HENT:  Head: Normocephalic and atraumatic.  Mouth/Throat: Oropharynx is clear and moist.  Eyes: Pupils are equal, round, and reactive to light. Conjunctivae and EOM are normal.  Neck: Normal range of motion.  Cardiovascular: Normal rate, regular rhythm and normal heart sounds.  Pulmonary/Chest: Effort normal and breath sounds normal. No stridor. No respiratory distress.  Abdominal: Soft. Bowel sounds are normal. There is no tenderness. There is no rebound.  Musculoskeletal: Normal range of motion.  Neurological: He is alert and oriented to person, place, and time.  Skin: Skin is warm and dry.  Psychiatric: He has a normal mood and affect. He is not actively hallucinating. He expresses suicidal ideation. He expresses no homicidal ideation. He expresses suicidal plans. He expresses no homicidal plans.  Nursing note and vitals reviewed.    ED Treatments / Results  Labs (all labs ordered are listed, but only abnormal results are displayed) Labs Reviewed  COMPREHENSIVE METABOLIC PANEL - Abnormal; Notable for the following components:      Result Value   Sodium 147 (*)    Glucose, Bld 143 (*)    Total Protein 6.2 (*)    Albumin 3.2 (*)    All other components within normal limits  ACETAMINOPHEN LEVEL - Abnormal; Notable for the following components:   Acetaminophen (Tylenol), Serum <10 (*)    All other  components within normal limits  CBC - Abnormal; Notable for the following components:   RBC 3.85 (*)    Hemoglobin 11.0 (*)    HCT 35.9 (*)    All other components within normal limits  RAPID URINE DRUG SCREEN, HOSP PERFORMED - Abnormal; Notable for the following components:   Cocaine POSITIVE (*)    Barbiturates   (*)    Value: Result not available. Reagent lot number recalled by manufacturer.   All other components within normal limits  ETHANOL  SALICYLATE LEVEL    EKG None  Radiology No results found.  Procedures Procedures (including critical care time)  Medications Ordered in ED Medications - No data to display   Initial Impression / Assessment and Plan / ED Course  I have reviewed the triage vital signs and the nursing notes.  Pertinent labs & imaging results that were available during my care of the patient  were reviewed by me and considered in my medical decision making (see chart for details).  57 year old male here with suicidal ideation.  Patient has several ED visits for same.  Today he was actually found on the side of a bridge threatening to jump off and was brought in by bystander.  Here he reports continued suicidal ideation.  Denies any hallucinations or homicidal ideation.  Of note, patient no longer has a care coordinator as they attempted to contact them during 1 of previous ED visits earlier this month.  He was referred back to behavioral health for assistance but states "he does not know what to do".  Screening labs were obtained and are overall reassuring.  UDS is positive for cocaine.  We will plan for TTS evaluation.  If no inpatient recommendations made, I feel patient should be seen by social work/case management as he no longer has a care coordinator/ACT team member and will definitely benefit from having someone to assist with medication, OP follow-up visits, etc.  6:17 AM Care signed out to morning team.  Will follow-up on TTS recommendation and SW  consult.  Final Clinical Impressions(s) / ED Diagnoses   Final diagnoses:  Suicidal ideation    ED Discharge Orders    None       Garlon HatchetSanders, Sakiyah Shur M, PA-C 12/26/17 [id number]    Derwood KaplanNanavati, Ankit, MD 12/26/17 2329

## 2017-12-26 NOTE — ED Provider Notes (Signed)
Pt brought in by bystander after being pulled off of bridge, hx of schizophrenia, pending TTS consult  If TTS feels pt can be discharged pt will need case management and social work consults prior to DC as his prior case worker Clydie BraunKaren, is no longer in charge of his case.  TTS reevaluated patient and do feel that he is charged home for suicide risk assessment.  Social work reach out to [facility] regarding patient's previous IT trainercaseworker, but it appears patient is no longer engaging in services [facility], patient provided resources for [facility] and [facility].   Dartha LodgeFord, Kelsey N, PA-C 12/26/17 1434    Derwood KaplanNanavati, Ankit, MD 12/26/17 2329

## 2017-12-26 NOTE — BHH Suicide Risk Assessment (Cosign Needed)
Suicide Risk Assessment  Discharge Assessment   [facility]   Principal Problem: Cocaine use disorder, severe, dependence [facility](HCC) Discharge Diagnoses:  Patient Active Problem List   Diagnosis Date Noted  . Cocaine abuse with cocaine-induced mood disorder (HCC) [F14.14] 09/18/2017  . Chronic foot pain [M79.673, G89.29]   . Schizoaffective disorder, bipolar type (HCC) [F25.0] 09/30/2016  . Suicidal thoughts [R45.851]   . Substance induced mood disorder (HCC) [F19.94] 03/13/2015  . Acute kidney failure (HCC) [N17.9] 01/26/2015  . Schizophrenia, paranoid type (HCC) [F20.0] 01/17/2015  . Suicidal ideation [R45.851]   . 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 Substance or medication-induced bipolar and related disorder with onset during intoxication (HCC) [W09.811[F19.929, F19.94] 08/10/2014  . Acute CHF (congestive heart failure) (HCC) [I50.9] 07/29/2014  . Cocaine use disorder, severe, dependence (HCC) [F14.20]   . Cocaine abuse (HCC) [F14.10] 03/10/2014  . Essential hypertension, benign [I10] 03/28/2013  . Diabetes mellitus (HCC) [E11.9] 03/15/2013   Pt was seen and chart reviewed with treatment team and Dr Sharma CovertNorman. [facility] bilateral [facility] pain and cocaine abuse. Pt is well known to this ED and has had 37 visits in 6 months. Pt frequently comes to the ED because he is homeless. Pt has a high degree of secondary gain by seeking food and shelter n the ED when he has been on a cocaine binge. Pt is stable and psychiatrically clear for discharge.   Total Time spent with patient: 30 minutes  Musculoskeletal: Strength & Muscle Tone: within normal limits Gait & Station: normal Patient leans: N/A  Psychiatric Specialty Exam:   Blood pressure (!) 157/77, pulse 88, temperature (!) 97.4 F (36.3 C), temperature source Axillary, resp. rate 16, SpO2 98 %.There is no height or weight on  file to calculate BMI.  General Appearance: Disheveled  Eye Contact::  Good  Speech:  Clear and Coherent and Pressured409  Volume:  Normal  Mood:  Anxious  Affect:  Congruent  Thought Process:  Coherent and Linear  Orientation:  Full (Time, Place, and Person)  Thought Content:  Logical  Suicidal Thoughts:  No  Homicidal Thoughts:  No  Memory:  Immediate;   Good Recent;   Good Remote;   Fair  Judgement:  Poor  Insight:  Lacking  Psychomotor Activity:  Increased  Concentration:  Good  Recall:  Good  Fund of Knowledge:Good  Language: Good  Akathisia:  No  Handed:  Right  AIMS (if indicated):     Assets:  Communication Skills Social Support  Sleep:     Cognition: WNL  ADL's:  Intact   Mental Status Per Nursing Assessment::   On Admission:     Demographic Factors:  Male, Low socioeconomic status and Unemployed  Loss Factors: Financial problems/change in socioeconomic status  Historical Factors: Impulsivity  Risk Reduction Factors:   Sense of responsibility to family  Continued Clinical Symptoms:  Severe Anxiety and/or Agitation Depression:   Comorbid alcohol abuse/dependence Impulsivity  Cognitive Features That Contribute To Risk:  Closed-mindedness    Suicide Risk:  Minimal: No identifiable suicidal ideation.  Patients presenting with no risk factors but with morbid ruminations; may be classified as minimal risk based on the severity of the depressive symptoms  Follow-up Information    [name] Follow up.   Specialty:  Behavioral Health Why:  Please follow up for outpatient services  Contact information: [address] [place] [address] [phone]  Plan Of Care/Follow-up recommendations:  Activity:  as tolerated Diet:  Heart healthy  Laveda Abbe, NP 12/26/2017, 1:18 PM

## 2017-12-26 NOTE — Discharge Instructions (Addendum)
For your behavioral health needs, you are advised to follow up with one of the following providers.  Contact them at your earliest opportunity to enroll in their program:       [facility]      [address]      [place] [address]      [phone]      New patients are seen at their walk-in clinic.  Walk-in hours are Monday - Friday from 8:00 am - 12:00 pm, and from 1:00 pm - 3:00 pm.  Walk-in patients are seen on a first come, first served basis, so try to arrive as early as possible for the best chance of being seen the same day.  There is an initial fee of $22.50.       [name]      [address]      [place], [place] [address]      [phone]      New and returning patients are seen at their walk-in clinic.  Walk-in hours are Monday - Friday from 8:00 am - 3:00 pm.  Walk-in patients are seen on a first come, first served basis.  Try to arrive as early as possible for he best chance of being seen the same day.

## 2017-12-26 NOTE — ED Notes (Signed)
Pt discharged home. Discharged instructions read to pt who verbalized understanding. All belongings returned to pt who signed for same. Denies SI/HI, is not delusional and not responding to internal stimuli. Escorted pt to the ED exit.    

## 2017-12-26 NOTE — BH Assessment (Signed)
[facility]  Per Juanetta BeetsJacqueline Norman, DO, this pt does not require psychiatric hospitalization at this time.  [facility] with recommendation to follow up with [facility] or [facility].  This has been included in pt's discharge instructions.  Pt's nurse, Diane, has been notified.  Doylene Canninghomas Mckala Pantaleon, MA Triage Specialist [phone]

## 2017-12-27 ENCOUNTER — Emergency Department (HOSPITAL_COMMUNITY)
Admission: EM | Admit: 2017-12-27 | Discharge: 2017-12-28 | Disposition: A | Discharge status: Home / Self Care | Payer: Medicaid Other | Attending: Emergency Medicine | Admitting: Emergency Medicine

## 2017-12-27 ENCOUNTER — Emergency Department (HOSPITAL_COMMUNITY): Payer: Medicaid Other

## 2017-12-27 ENCOUNTER — Other Ambulatory Visit: Payer: Self-pay

## 2017-12-27 DIAGNOSIS — Z79899 Other long term (current) drug therapy: Secondary | ICD-10-CM | POA: Diagnosis not present

## 2017-12-27 DIAGNOSIS — I509 Heart failure, unspecified: Secondary | ICD-10-CM | POA: Insufficient documentation

## 2017-12-27 DIAGNOSIS — F17210 Nicotine dependence, cigarettes, uncomplicated: Secondary | ICD-10-CM | POA: Insufficient documentation

## 2017-12-27 DIAGNOSIS — I110 Hypertensive heart disease with heart failure: Secondary | ICD-10-CM | POA: Diagnosis not present

## 2017-12-27 DIAGNOSIS — S0011XA Contusion of right eyelid and periocular area, initial encounter: Secondary | ICD-10-CM | POA: Diagnosis not present

## 2017-12-27 DIAGNOSIS — E1140 Type 2 diabetes mellitus with diabetic neuropathy, unspecified: Secondary | ICD-10-CM | POA: Insufficient documentation

## 2017-12-27 DIAGNOSIS — M79671 Pain in right foot: Secondary | ICD-10-CM | POA: Diagnosis not present

## 2017-12-27 NOTE — ED Triage Notes (Signed)
Patient c/o right right foot, heel pain after walking 10 miles. Denies injury

## 2017-12-28 ENCOUNTER — Encounter (HOSPITAL_COMMUNITY): Payer: Self-pay | Admitting: Emergency Medicine

## 2017-12-28 ENCOUNTER — Emergency Department (HOSPITAL_COMMUNITY)
Admission: EM | Admit: 2017-12-28 | Discharge: 2017-12-28 | Disposition: A | Discharge status: Home / Self Care | Payer: Medicaid Other | Attending: Emergency Medicine | Admitting: Emergency Medicine

## 2017-12-28 DIAGNOSIS — Z79899 Other long term (current) drug therapy: Secondary | ICD-10-CM | POA: Diagnosis not present

## 2017-12-28 DIAGNOSIS — H02843 Edema of right eye, unspecified eyelid: Secondary | ICD-10-CM

## 2017-12-28 DIAGNOSIS — S0011XA Contusion of right eyelid and periocular area, initial encounter: Secondary | ICD-10-CM | POA: Diagnosis present

## 2017-12-28 DIAGNOSIS — E1140 Type 2 diabetes mellitus with diabetic neuropathy, unspecified: Secondary | ICD-10-CM | POA: Diagnosis not present

## 2017-12-28 DIAGNOSIS — S0590XA Unspecified injury of unspecified eye and orbit, initial encounter: Secondary | ICD-10-CM

## 2017-12-28 DIAGNOSIS — I110 Hypertensive heart disease with heart failure: Secondary | ICD-10-CM | POA: Insufficient documentation

## 2017-12-28 DIAGNOSIS — Y939 Activity, unspecified: Secondary | ICD-10-CM | POA: Insufficient documentation

## 2017-12-28 DIAGNOSIS — I509 Heart failure, unspecified: Secondary | ICD-10-CM | POA: Diagnosis not present

## 2017-12-28 DIAGNOSIS — Y999 Unspecified external cause status: Secondary | ICD-10-CM | POA: Insufficient documentation

## 2017-12-28 DIAGNOSIS — Y929 Unspecified place or not applicable: Secondary | ICD-10-CM | POA: Insufficient documentation

## 2017-12-28 MED ORDER — ACETAMINOPHEN 500 MG PO TABS
1000.0000 mg | ORAL_TABLET | Freq: Once | ORAL | Status: AC
  Administered 2017-12-28: 1000 mg via ORAL
  Filled 2017-12-28: qty 2

## 2017-12-28 NOTE — ED Notes (Signed)
Pt soaking feet per order. Pt given graham crackers and juice

## 2017-12-28 NOTE — ED Triage Notes (Addendum)
Pt arrives to ED with swollen right eyelid and disheveled and torn paper hospital scrubs. Pt requesting new paper scrubs and a sprite. When pt lifts up his swollen eyelid his vision is the same.

## 2017-12-28 NOTE — ED Notes (Signed)
Pt walking outside. Unable to triage.

## 2017-12-28 NOTE — Progress Notes (Signed)
Orthopedic Tech Progress Note Patient Details:  Mellody MemosFrederick L Antone 01/20/1961 [id number]  Ortho Devices Type of Ortho Device: Postop shoe/boot Ortho Device/Splint Location: bi. delivered to rn at rns request. Ortho Device/Splint Interventions: Ordered, Application, Adjustment   Post Interventions Patient Tolerated: Well Instructions Provided: Care of device, Adjustment of device   Trinna PostMartinez, Jissel Slavens J 12/28/2017, 7:03 AM

## 2017-12-28 NOTE — ED Provider Notes (Signed)
[facility] EMERGENCY DEPARTMENT Provider Note   CSN: [id number] Arrival date & time: 12/27/17  2236     History   Chief Complaint Chief Complaint  Patient presents with  . Foot Pain    Right foot    HPI [name] is a 57 y.o. male.  The history is provided by the patient.  Foot Pain  This is a chronic problem. The current episode started more than 1 week ago. The problem occurs constantly. The problem has not changed since onset.Pertinent negatives include no chest pain, no abdominal pain, no headaches and no shortness of breath. Nothing aggravates the symptoms. Nothing relieves the symptoms. He has tried nothing for the symptoms. The treatment provided no relief.    Past Medical History:  Diagnosis Date  . CHF (congestive heart failure) (HCC)   . Chronic foot pain   . Cocaine abuse (HCC)   . Diabetes mellitus without complication (HCC)   . Hepatitis C   . Homelessness   . Homelessness   . Hypertension   . Neuropathy   . Polysubstance abuse (HCC)   . Schizophrenia [facility])     Patient Active Problem List   Diagnosis Date Noted  . Cocaine abuse with cocaine-induced mood disorder (HCC) 09/18/2017  . Chronic foot pain   . Schizoaffective disorder, bipolar type (HCC) 09/30/2016  . Suicidal thoughts   . Substance induced mood disorder (HCC) 03/13/2015  . Acute kidney failure (HCC) 01/26/2015  . Schizophrenia, paranoid type (HCC) 01/17/2015  . Suicidal ideation   . Drug hallucinosis (HCC) 10/08/2014  . Chronic paranoid schizophrenia (HCC) 09/07/2014  . 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 Acute CHF (congestive heart failure) (HCC) 07/29/2014  . Cocaine use disorder, severe, dependence (HCC)   . Cocaine abuse (HCC) 03/10/2014  . Essential hypertension, benign 03/28/2013  . Diabetes mellitus (HCC) 03/15/2013    History reviewed. No pertinent surgical  history.      Home Medications    Prior to Admission medications   Medication Sig Start Date End Date Taking? Authorizing Provider  acetaminophen (TYLENOL) 500 MG tablet Take 1 tablet (500 mg total) by mouth every 6 (six) hours as needed. Patient not taking: Reported on 12/10/2017 12/07/17   McDonald, Mia A, PA-C  Cetirizine HCl (ZYRTEC ALLERGY) 10 MG CAPS Take 1 capsule (10 mg total) by mouth daily. Patient not taking: Reported on 11/23/2017 10/03/17   Ward, Layla Maw, DO  clotrimazole (LOTRIMIN) 1 % cream Apply to affected area 2 times daily Patient not taking: Reported on 12/18/2017 12/17/17   Roxy Horseman, PA-C  fluticasone [facility]) 50 MCG/ACT nasal spray Place 2 sprays into both nostrils daily. Patient not taking: Reported on 11/23/2017 10/03/17   Ward, Layla Maw, DO  hydrochlorothiazide (HYDRODIURIL) 25 MG tablet Take 1 tablet (25 mg total) by mouth daily. Patient not taking: Reported on 11/23/2017 10/13/17   Ward, Layla Maw, DO  lisinopril (PRINIVIL,ZESTRIL) 40 MG tablet Take 1 tablet (40 mg total) by mouth daily. Patient not taking: Reported on 11/23/2017 10/13/17   Ward, Layla Maw, DO    Family History Family History  Problem Relation Age of Onset  . Hypertension Other   . Diabetes Other     Social History Social History   Tobacco Use  . Smoking status: Current Every Day Smoker    Packs/day: 1.00    Years: 20.00    Pack years: 20.00    Types: Cigarettes  . Smokeless  tobacco: Current User  Substance Use Topics  . Alcohol use: Yes    Comment: Daily Drinker   . Drug use: Yes    Frequency: 7.0 times per week    Types: "Crack" cocaine, Cocaine, Marijuana    Comment: Cocaine tonight, Marijuana "a long time"     Allergies   Haldol [haloperidol]   Review of Systems Review of Systems  Constitutional: Negative for fever.  Respiratory: Negative for shortness of breath.   Cardiovascular: Negative for chest pain.  Gastrointestinal: Negative for abdominal pain.   Musculoskeletal: Positive for arthralgias.  Skin: Negative for wound.  Neurological: Negative for headaches.  All other systems reviewed and are negative.    Physical Exam Updated Vital Signs BP (!) 160/93 (BP Location: Right Arm)   Pulse (!) 101   Temp 98.6 F (37 C) (Oral)   Resp 15   Ht 5\' 9"  (1.753 m)   Wt 74.8 kg (165 lb)   SpO2 95%   BMI 24.37 kg/m   Physical Exam  Constitutional: He is oriented to person, place, and time. He appears well-developed and well-nourished. No distress.  HENT:  Head: Normocephalic and atraumatic.  Mouth/Throat: No oropharyngeal exudate.  Eyes: Pupils are equal, round, and reactive to light. Conjunctivae are normal.  Neck: Normal range of motion. Neck supple.  Cardiovascular: Normal rate, regular rhythm, normal heart sounds and intact distal pulses.  Pulmonary/Chest: Effort normal. No stridor. He has no wheezes. He has no rales.  Abdominal: Soft. Bowel sounds are normal. He exhibits no mass. There is no tenderness. There is no rebound and no guarding.  Musculoskeletal:       Right ankle: Normal. Achilles tendon normal.       Left ankle: Normal. Achilles tendon normal.       Right foot: Normal.       Left foot: Normal.  Neurological: He is alert and oriented to person, place, and time. He displays normal reflexes.  Skin: Skin is warm and dry. Capillary refill takes less than 2 seconds. No erythema.  Psychiatric: He has a normal mood and affect.     ED Treatments / Results  Labs (all labs ordered are listed, but only abnormal results are displayed) Labs Reviewed - No data to display  EKG None  Radiology Dg Foot Complete Right  Result Date: 12/27/2017 CLINICAL DATA:  57 year old male with right foot pain after walking. No known trauma. EXAM: RIGHT FOOT COMPLETE - 3+ VIEW COMPARISON:  Right foot radiograph dated 12/14/2014 FINDINGS: There is no acute fracture or dislocation. Mild degenerative changes of the first MTP joint. The soft  tissues appear unremarkable. IMPRESSION: Negative. Electronically Signed   By: Elgie CollardArash  Radparvar M.D.   On: 12/27/2017 23:28    Procedures Procedures (including critical care time)  Medications Ordered in ED Medications - No data to display     Final Clinical Impressions(s) / ED Diagnoses   Return for pain, numbness, changes in vision or speech, fevers >100.4 unrelieved by medication, shortness of breath, intractable vomiting, or diarrhea, abdominal pain, Inability to tolerate liquids or food, cough, altered mental status or any concerns. No signs of systemic illness or infection. The patient is nontoxic-appearing on exam and vital signs are within normal limits. Will refer to urology for microscopy hematuria as patient is asymptomatic.  I have reviewed the triage vital signs and the nursing notes. Pertinent labs &imaging results that were available during my care of the patient were reviewed by me and considered in my medical decision  making (see chart for details).  After history, exam, and medical workup I feel the patient has been appropriately medically screened and is safe for discharge home. Pertinent diagnoses were discussed with the patient. Patient was given return precautions.      Lolita Faulds, MD 12/28/17 1610
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
## 2017-12-28 NOTE — ED Notes (Signed)
Pt understood dc material. NAD noted. 

## 2017-12-28 NOTE — Discharge Instructions (Signed)
Please use ice to help swelling surrounding the area go down, Tylenol as needed for pain.  If you have any change in vision, worsening eye pain, the color part of your eye becomes an odd shape, swelling becomes worse or you develop redness warmth or drainage from the eye please return to the ED or follow-up with ophthalmology.

## 2017-12-29 ENCOUNTER — Emergency Department (HOSPITAL_COMMUNITY)
Admission: EM | Admit: 2017-12-29 | Discharge: 2017-12-29 | Disposition: A | Discharge status: Home / Self Care | Payer: Medicaid Other | Attending: Emergency Medicine | Admitting: Emergency Medicine

## 2017-12-29 ENCOUNTER — Encounter (HOSPITAL_COMMUNITY): Payer: Self-pay | Admitting: Emergency Medicine

## 2017-12-29 DIAGNOSIS — M79672 Pain in left foot: Secondary | ICD-10-CM | POA: Insufficient documentation

## 2017-12-29 DIAGNOSIS — Z79899 Other long term (current) drug therapy: Secondary | ICD-10-CM | POA: Diagnosis not present

## 2017-12-29 DIAGNOSIS — F17210 Nicotine dependence, cigarettes, uncomplicated: Secondary | ICD-10-CM | POA: Insufficient documentation

## 2017-12-29 DIAGNOSIS — I110 Hypertensive heart disease with heart failure: Secondary | ICD-10-CM | POA: Diagnosis not present

## 2017-12-29 DIAGNOSIS — M79671 Pain in right foot: Secondary | ICD-10-CM

## 2017-12-29 DIAGNOSIS — I509 Heart failure, unspecified: Secondary | ICD-10-CM | POA: Insufficient documentation

## 2017-12-29 DIAGNOSIS — Z765 Malingerer [conscious simulation]: Secondary | ICD-10-CM

## 2017-12-29 DIAGNOSIS — E119 Type 2 diabetes mellitus without complications: Secondary | ICD-10-CM | POA: Diagnosis not present

## 2017-12-29 NOTE — ED Provider Notes (Signed)
[facility] EMERGENCY DEPARTMENT Provider Note   CSN: [id number] Arrival date & time: 12/29/17  0225     History   Chief Complaint Chief Complaint  Patient presents with  . Foot Pain    HPI [name] is a 57 y.o. male.  Patient to ED for the 9th time in 10 days for complaint of bilateral foot pain. No other complaints. He is requesting lotion, a ginger ale, a sandwich, clean paper scrubs and a bus pass.   The history is provided by the patient. No language interpreter was used.  Foot Pain  Pertinent negatives include no chest pain and no shortness of breath.    Past Medical History:  Diagnosis Date  . CHF (congestive heart failure) (HCC)   . Chronic foot pain   . Cocaine abuse (HCC)   . Diabetes mellitus without complication (HCC)   . Hepatitis C   . Homelessness   . Homelessness   . Hypertension   . Neuropathy   . Polysubstance abuse (HCC)   . Schizophrenia [facility])     Patient Active Problem List   Diagnosis Date Noted  . Cocaine abuse with cocaine-induced mood disorder (HCC) 09/18/2017  . Chronic foot pain   . Schizoaffective disorder, bipolar type (HCC) 09/30/2016  . Suicidal thoughts   . Substance induced mood disorder (HCC) 03/13/2015  . Acute kidney failure (HCC) 01/26/2015  . Schizophrenia, paranoid type (HCC) 01/17/2015  . Suicidal ideation   . Drug hallucinosis (HCC) 10/08/2014  . Chronic paranoid schizophrenia (HCC) 09/07/2014  . 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 Acute CHF (congestive heart failure) (HCC) 07/29/2014  . Cocaine use disorder, severe, dependence (HCC)   . Cocaine abuse (HCC) 03/10/2014  . Essential hypertension, benign 03/28/2013  . Diabetes mellitus (HCC) 03/15/2013    History reviewed. No pertinent surgical history.      Home Medications    Prior to Admission medications   Medication Sig Start Date End Date Taking?  Authorizing Provider  acetaminophen (TYLENOL) 500 MG tablet Take 1 tablet (500 mg total) by mouth every 6 (six) hours as needed. Patient not taking: Reported on 12/10/2017 12/07/17   McDonald, Mia A, PA-C  Cetirizine HCl (ZYRTEC ALLERGY) 10 MG CAPS Take 1 capsule (10 mg total) by mouth daily. Patient not taking: Reported on 11/23/2017 10/03/17   Ward, Layla Maw, DO  clotrimazole (LOTRIMIN) 1 % cream Apply to affected area 2 times daily Patient not taking: Reported on 12/18/2017 12/17/17   Roxy Horseman, PA-C  fluticasone [facility]) 50 MCG/ACT nasal spray Place 2 sprays into both nostrils daily. Patient not taking: Reported on 11/23/2017 10/03/17   Ward, Layla Maw, DO  hydrochlorothiazide (HYDRODIURIL) 25 MG tablet Take 1 tablet (25 mg total) by mouth daily. Patient not taking: Reported on 11/23/2017 10/13/17   Ward, Layla Maw, DO  lisinopril (PRINIVIL,ZESTRIL) 40 MG tablet Take 1 tablet (40 mg total) by mouth daily. Patient not taking: Reported on 11/23/2017 10/13/17   Ward, Layla Maw, DO    Family History Family History  Problem Relation Age of Onset  . Hypertension Other   . Diabetes Other     Social History Social History   Tobacco Use  . Smoking status: Current Every Day Smoker    Packs/day: 1.00    Years: 20.00    Pack years: 20.00    Types: Cigarettes  . Smokeless tobacco: Current User  Substance Use Topics  . Alcohol use: Yes  Comment: Daily Drinker   . Drug use: Yes    Frequency: 7.0 times per week    Types: "Crack" cocaine, Cocaine, Marijuana    Comment: Cocaine tonight, Marijuana "a long time"     Allergies   Haldol [haloperidol]   Review of Systems Review of Systems  Constitutional: Negative for chills and fever.  Respiratory: Negative for shortness of breath.   Cardiovascular: Negative for chest pain.  Musculoskeletal:       See HPI  Skin: Negative.   Neurological: Negative.      Physical Exam Updated Vital Signs BP (!) 175/120 (BP Location: Right Arm)    Pulse 99   Temp 98.4 F (36.9 C) (Oral)   Resp 20   Ht 5\' 9"  (1.753 m)   Wt 74.8 kg (165 lb)   SpO2 98%   BMI 24.37 kg/m   Physical Exam  Constitutional: He is oriented to person, place, and time. He appears well-developed and well-nourished.  Neck: Normal range of motion.  Pulmonary/Chest: Effort normal.  Musculoskeletal: Normal range of motion.  Feet show sign of swelling, wound, redness or discoloration. FROM all joints of ankles and feet. He is ambulatory without difficulty.  Neurological: He is alert and oriented to person, place, and time.  Skin: Skin is warm and dry.  Psychiatric: He has a normal mood and affect.     ED Treatments / Results  Labs (all labs ordered are listed, but only abnormal results are displayed) Labs Reviewed - No data to display  EKG None  Radiology Dg Foot Complete Right  Result Date: 12/27/2017 CLINICAL DATA:  57 year old male with right foot pain after walking. No known trauma. EXAM: RIGHT FOOT COMPLETE - 3+ VIEW COMPARISON:  Right foot radiograph dated 12/14/2014 FINDINGS: There is no acute fracture or dislocation. Mild degenerative changes of the first MTP joint. The soft tissues appear unremarkable. IMPRESSION: Negative. Electronically Signed   By: Elgie CollardArash  Radparvar M.D.   On: 12/27/2017 23:28    Procedures Procedures (including critical care time)  Medications Ordered in ED Medications - No data to display   Initial Impression / Assessment and Plan / ED Course  I have reviewed the triage vital signs and the nursing notes.  Pertinent labs & imaging results that were available during my care of the patient were reviewed by me and considered in my medical decision making (see chart for details).     Patient here for foot pain. No obvious injury. No evidence of infection.   Final Clinical Impressions(s) / ED Diagnoses   Final diagnoses:  None   1. Foot pain 2. Malingering   ED Discharge Orders    None       Elpidio AnisUpstill,  Kylinn Shropshire, PA-C 12/29/17 0421    Nira Connardama, Pedro Eduardo, MD 12/30/17 [phone]

## 2017-12-29 NOTE — ED Triage Notes (Signed)
Pt c/o bilat foot pain, recurrent, pt requesting new paper scrubs and beverages in triage.
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
## 2017-12-29 NOTE — ED Notes (Signed)
Pt seen walking through waiting room cursing and throwing socks and bp cuff

## 2017-12-29 NOTE — ED Notes (Signed)
Pt given OJ and [employer] Crackers upon DC. Pt not happy and states he wants Mal[place] sandwich, milk, crackers, OJ. Pt proceeds to throw crackers and OJ across room when I offered it to him. Pt cussing on way out.

## 2017-12-29 NOTE — Discharge Instructions (Addendum)
Follow up with your doctor for routine concerns.

## 2017-12-30 ENCOUNTER — Emergency Department (HOSPITAL_COMMUNITY)
Admission: EM | Admit: 2017-12-30 | Discharge: 2017-12-31 | Disposition: A | Discharge status: Home / Self Care | Payer: Medicaid Other | Attending: Emergency Medicine | Admitting: Emergency Medicine

## 2017-12-30 ENCOUNTER — Encounter (HOSPITAL_COMMUNITY): Payer: Self-pay | Admitting: *Deleted

## 2017-12-30 ENCOUNTER — Other Ambulatory Visit: Payer: Self-pay

## 2017-12-30 ENCOUNTER — Emergency Department (HOSPITAL_COMMUNITY)
Admission: EM | Admit: 2017-12-30 | Discharge: 2017-12-30 | Disposition: A | Discharge status: Home / Self Care | Payer: Medicaid Other | Attending: Emergency Medicine | Admitting: Emergency Medicine

## 2017-12-30 DIAGNOSIS — Z79899 Other long term (current) drug therapy: Secondary | ICD-10-CM | POA: Diagnosis not present

## 2017-12-30 DIAGNOSIS — K0889 Other specified disorders of teeth and supporting structures: Secondary | ICD-10-CM | POA: Diagnosis present

## 2017-12-30 DIAGNOSIS — I10 Essential (primary) hypertension: Secondary | ICD-10-CM | POA: Diagnosis not present

## 2017-12-30 DIAGNOSIS — E1140 Type 2 diabetes mellitus with diabetic neuropathy, unspecified: Secondary | ICD-10-CM | POA: Diagnosis not present

## 2017-12-30 DIAGNOSIS — F209 Schizophrenia, unspecified: Secondary | ICD-10-CM | POA: Diagnosis not present

## 2017-12-30 DIAGNOSIS — F17210 Nicotine dependence, cigarettes, uncomplicated: Secondary | ICD-10-CM | POA: Diagnosis not present

## 2017-12-30 DIAGNOSIS — Z590 Homelessness: Secondary | ICD-10-CM | POA: Insufficient documentation

## 2017-12-30 DIAGNOSIS — I509 Heart failure, unspecified: Secondary | ICD-10-CM | POA: Insufficient documentation

## 2017-12-30 DIAGNOSIS — I110 Hypertensive heart disease with heart failure: Secondary | ICD-10-CM | POA: Insufficient documentation

## 2017-12-30 DIAGNOSIS — F1910 Other psychoactive substance abuse, uncomplicated: Secondary | ICD-10-CM | POA: Insufficient documentation

## 2017-12-30 DIAGNOSIS — R45851 Suicidal ideations: Secondary | ICD-10-CM | POA: Diagnosis not present

## 2017-12-30 DIAGNOSIS — K029 Dental caries, unspecified: Secondary | ICD-10-CM | POA: Diagnosis not present

## 2017-12-30 DIAGNOSIS — F1414 Cocaine abuse with cocaine-induced mood disorder: Secondary | ICD-10-CM

## 2017-12-30 LAB — COMPREHENSIVE METABOLIC PANEL
ALT: 11 U/L (ref 0–44)
AST: 16 U/L (ref 15–41)
Albumin: 3.5 g/dL (ref 3.5–5.0)
Alkaline Phosphatase: 77 U/L (ref 38–126)
Anion gap: 6 (ref 5–15)
BUN: 10 mg/dL (ref 6–20)
CALCIUM: 9 mg/dL (ref 8.9–10.3)
CO2: 26 mmol/L (ref 22–32)
CREATININE: 0.92 mg/dL (ref 0.61–1.24)
Chloride: 107 mmol/L (ref 98–111)
GFR calc non Af Amer: >60 mL/min (ref >60)
Glucose, Bld: 172 mg/dL — ABNORMAL HIGH (ref 70–99)
Potassium: 4 mmol/L (ref 3.5–5.1)
Sodium: 139 mmol/L (ref 135–145)
Total Bilirubin: 1.1 mg/dL (ref 0.3–1.2)
Total Protein: 6.4 g/dL — ABNORMAL LOW (ref 6.5–8.1)

## 2017-12-30 LAB — RAPID URINE DRUG SCREEN, HOSP PERFORMED
Amphetamines: NOT DETECTED
Benzodiazepines: NOT DETECTED
Cocaine: POSITIVE — AB
OPIATES: NOT DETECTED
TETRAHYDROCANNABINOL: NOT DETECTED

## 2017-12-30 LAB — CBC
HEMATOCRIT: 39.6 % (ref 39.0–52.0)
Hemoglobin: 12.1 g/dL — ABNORMAL LOW (ref 13.0–17.0)
MCH: 28.9 pg (ref 26.0–34.0)
MCHC: 30.6 g/dL (ref 30.0–36.0)
MCV: 94.7 fL (ref 78.0–100.0)
Platelets: 179 10*3/uL (ref 150–400)
RBC: 4.18 MIL/uL — ABNORMAL LOW (ref 4.22–5.81)
RDW: 13.9 % (ref 11.5–15.5)
WBC: 5.1 10*3/uL (ref 4.0–10.5)

## 2017-12-30 LAB — ETHANOL: Alcohol, Ethyl (B): <10 mg/dL (ref <10)

## 2017-12-30 LAB — ACETAMINOPHEN LEVEL: Acetaminophen (Tylenol), Serum: <10 ug/mL — ABNORMAL LOW (ref 10–30)

## 2017-12-30 LAB — SALICYLATE LEVEL

## 2017-12-30 MED ORDER — HYDROCHLOROTHIAZIDE 25 MG PO TABS
25.0000 mg | ORAL_TABLET | Freq: Every day | ORAL | Status: DC
  Administered 2017-12-30: 25 mg via ORAL
  Filled 2017-12-30: qty 1

## 2017-12-30 MED ORDER — ONDANSETRON HCL 4 MG PO TABS: 4.0000 mg | ORAL_TABLET | Freq: Three times a day (TID) | ORAL | Status: DC | PRN

## 2017-12-30 MED ORDER — LISINOPRIL 20 MG PO TABS
40.0000 mg | ORAL_TABLET | Freq: Every day | ORAL | Status: DC
  Administered 2017-12-30: 40 mg via ORAL
  Filled 2017-12-30: qty 2

## 2017-12-30 MED ORDER — GABAPENTIN 300 MG PO CAPS
300.0000 mg | ORAL_CAPSULE | Freq: Three times a day (TID) | ORAL | Status: DC
  Administered 2017-12-30: 300 mg via ORAL
  Filled 2017-12-30: qty 1

## 2017-12-30 MED ORDER — ACETAMINOPHEN 325 MG PO TABS
650.0000 mg | ORAL_TABLET | Freq: Once | ORAL | Status: AC
Start: 2017-12-30 — End: 2017-12-30
  Administered 2017-12-30: 650 mg via ORAL
  Filled 2017-12-30: qty 2

## 2017-12-30 MED ORDER — ZOLPIDEM TARTRATE 5 MG PO TABS: 5.0000 mg | ORAL_TABLET | Freq: Every evening | ORAL | Status: DC | PRN

## 2017-12-30 MED ORDER — ACETAMINOPHEN 325 MG PO TABS: 650.0000 mg | ORAL_TABLET | ORAL | Status: DC | PRN

## 2017-12-30 MED ORDER — RISPERIDONE 1 MG PO TABS
1.0000 mg | ORAL_TABLET | Freq: Two times a day (BID) | ORAL | Status: DC
  Administered 2017-12-30: 1 mg via ORAL
  Filled 2017-12-30: qty 1

## 2017-12-30 MED ORDER — ACETAMINOPHEN 325 MG PO TABS
650.0000 mg | ORAL_TABLET | Freq: Once | ORAL | Status: AC
  Administered 2017-12-30: 650 mg via ORAL
  Filled 2017-12-30: qty 2

## 2017-12-30 NOTE — ED Notes (Signed)
Security remains at bedside 

## 2017-12-30 NOTE — ED Notes (Signed)
Pt. Given bag lunch and a milk.

## 2017-12-30 NOTE — ED Notes (Signed)
Pt presents to Lobby, pt previously taken out of Epic, pt taken to POD A 4

## 2017-12-30 NOTE — ED Triage Notes (Signed)
Pt at door of room A-1 . Pt refused to stay in room and walked out to front lobby. Pt reported I don't never stay in the room.

## 2017-12-30 NOTE — ED Notes (Signed)
Dr Deretha EmoryZackowski advised to let pt alone, allow him to rest. He becomes aggressive when bothered. Dr. Deretha EmoryZackowski will coordinate with [employer] and security.

## 2017-12-30 NOTE — ED Triage Notes (Signed)
Pt arrives ambulatory to triage room with c/o pain in his teeth and gums for 1 year.

## 2017-12-30 NOTE — ED Provider Notes (Signed)
[facility] EMERGENCY DEPARTMENT Provider Note   CSN: [id number] Arrival date & time: 12/30/17  0302    History   Chief Complaint Chief Complaint  Patient presents with  . Dental Pain    HPI [name] is a 57 y.o. male.  57 year old male with multiple visits to the ED presents today for complaints of dental pain.  He states that he has pain to his teeth and gums.  This has been ongoing for 1 year.  When asked why patient did not discuss this complaint at his visit yesterday, he states that he did not think of it.  He denies taking any medications for his symptoms.  No inability to swallow, fevers.     Past Medical History:  Diagnosis Date  . CHF (congestive heart failure) (HCC)   . Chronic foot pain   . Cocaine abuse (HCC)   . Diabetes mellitus without complication (HCC)   . Hepatitis C   . Homelessness   . Homelessness   . Hypertension   . Neuropathy   . Polysubstance abuse (HCC)   . Schizophrenia [facility])     Patient Active Problem List   Diagnosis Date Noted  . Cocaine abuse with cocaine-induced mood disorder (HCC) 09/18/2017  . Chronic foot pain   . Schizoaffective disorder, bipolar type (HCC) 09/30/2016  . Suicidal thoughts   . Substance induced mood disorder (HCC) 03/13/2015  . Acute kidney failure (HCC) 01/26/2015  . Schizophrenia, paranoid type (HCC) 01/17/2015  . Suicidal ideation   . Drug hallucinosis (HCC) 10/08/2014  . Chronic paranoid schizophrenia (HCC) 09/07/2014  . 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 Acute CHF (congestive heart failure) (HCC) 07/29/2014  . Cocaine use disorder, severe, dependence (HCC)   . Cocaine abuse (HCC) 03/10/2014  . Essential hypertension, benign 03/28/2013  . Diabetes mellitus (HCC) 03/15/2013    History reviewed. No pertinent surgical history.      Home Medications    Prior to Admission medications   Medication  Sig Start Date End Date Taking? Authorizing Provider  acetaminophen (TYLENOL) 500 MG tablet Take 1 tablet (500 mg total) by mouth every 6 (six) hours as needed. Patient not taking: Reported on 12/10/2017 12/07/17   McDonald, Mia A, PA-C  Cetirizine HCl (ZYRTEC ALLERGY) 10 MG CAPS Take 1 capsule (10 mg total) by mouth daily. Patient not taking: Reported on 11/23/2017 10/03/17   Ward, Layla Maw, DO  clotrimazole (LOTRIMIN) 1 % cream Apply to affected area 2 times daily Patient not taking: Reported on 12/18/2017 12/17/17   Roxy Horseman, PA-C  fluticasone [facility]) 50 MCG/ACT nasal spray Place 2 sprays into both nostrils daily. Patient not taking: Reported on 11/23/2017 10/03/17   Ward, Layla Maw, DO  hydrochlorothiazide (HYDRODIURIL) 25 MG tablet Take 1 tablet (25 mg total) by mouth daily. Patient not taking: Reported on 11/23/2017 10/13/17   Ward, Layla Maw, DO  lisinopril (PRINIVIL,ZESTRIL) 40 MG tablet Take 1 tablet (40 mg total) by mouth daily. Patient not taking: Reported on 11/23/2017 10/13/17   Ward, Layla Maw, DO    Family History Family History  Problem Relation Age of Onset  . Hypertension Other   . Diabetes Other     Social History Social History   Tobacco Use  . Smoking status: Current Every Day Smoker    Packs/day: 1.00    Years: 20.00    Pack years: 20.00    Types: Cigarettes  . Smokeless tobacco: Current User  Substance Use Topics  . Alcohol use: Yes    Comment: Daily Drinker   . Drug use: Yes    Frequency: 7.0 times per week    Types: "Crack" cocaine, Cocaine, Marijuana    Comment: Cocaine tonight, Marijuana "a long time"     Allergies   Haldol [haloperidol]   Review of Systems Review of Systems Ten systems reviewed and are negative for acute change, except as noted in the HPI.    Physical Exam Updated Vital Signs BP (!) 163/105 (BP Location: Right Arm)   Pulse 88   Temp 98.3 F (36.8 C) (Oral)   Resp 18   SpO2 99%   Physical Exam  Constitutional: He is  oriented to person, place, and time. He appears well-developed and well-nourished. No distress.  Nontoxic appearing. Sleeping when not stimulated.  HENT:  Head: Normocephalic and atraumatic.  Multiple areas of dental decay with caries.  Various broken teeth.  No trismus or facial swelling.  Tolerating secretions without difficulty.  Eyes: Conjunctivae and EOM are normal. No scleral icterus.  Neck: Normal range of motion.  Cardiovascular: Normal rate, regular rhythm and intact distal pulses.  Pulmonary/Chest: Effort normal. No stridor. No respiratory distress.  Respirations even and unlabored  Musculoskeletal: Normal range of motion.  Neurological: He is alert and oriented to person, place, and time. He exhibits normal muscle tone. Coordination normal.  GCS 15. Patient moving all extremities.  Skin: Skin is warm and dry. No rash noted. He is not diaphoretic. No erythema. No pallor.  Psychiatric: He has a normal mood and affect. His behavior is normal.  Nursing note and vitals reviewed.    ED Treatments / Results  Labs (all labs ordered are listed, but only abnormal results are displayed) Labs Reviewed - No data to display  EKG None  Radiology No results found.  Procedures Procedures (including critical care time)  Medications Ordered in ED Medications  acetaminophen (TYLENOL) tablet 650 mg (has no administration in time range)     Initial Impression / Assessment and Plan / ED Course  I have reviewed the triage vital signs and the nursing notes.  Pertinent labs & imaging results that were available during my care of the patient were reviewed by me and considered in my medical decision making (see chart for details).     Patient with dentalgia x 1 year.  No gross abscess.  Exam unconcerning for Ludwig's angina or spread of infection.  Urged patient to follow-up with dentist.  Given frequent ED visits (seen practically daily since 12/08/17), suspect malingering.  Return  precautions discussed and provided.  Patient discharged in stable condition with no unaddressed concerns.   Final Clinical Impressions(s) / ED Diagnoses   Final diagnoses:  Dental caries    ED Discharge Orders    None       Antony MaduraHumes, Rollin Kotowski, PA-C 12/30/17 0440    Shon BatonHorton, Courtney F, MD 12/31/17 0430

## 2017-12-30 NOTE — ED Triage Notes (Signed)
Pt is also asking for flip-flops [facility]. Pt also asking for a SW to see him.

## 2017-12-30 NOTE — ED Notes (Signed)
Pt arrived to Pod F9 via w/c. Belongings inventoried by Lanora ManisElizabeth, Charity fundraiserN - 1 labeled belongings bag placed in [employer] [facility] - no valuables noted.

## 2017-12-30 NOTE — ED Triage Notes (Signed)
Pt walked out of room A-1 before SI scrubs were given to Pt.  Pt left the ED and walked out to the street. Attempted to locate Pt but could not find Pt.  Security called to help locate Pt . Still unable to locate PT. Pt called multiple times with no answer.

## 2017-12-30 NOTE — ED Triage Notes (Signed)
PT reports he has gum paikn and denies anyh bleeding from gums.

## 2017-12-30 NOTE — ED Notes (Addendum)
Pt c/o hungry - ate graham crackers, peanut butter, Sprite, and milk. Pt then given sandwich and Sprite d/t continues to c/o hungry. Aware dinner tray has been ordered. Pt states he is going to court tomorrow and will need to shower tonight because he will be going to jail. States he has been homeless since released from jail in March. Pt verbalizes understanding of being d/c'd in am.

## 2017-12-30 NOTE — ED Provider Notes (Addendum)
[facility] Provider Note   CSN: [id number] Arrival date & time: 12/30/17  1008     History   Chief Complaint Chief Complaint  Patient presents with  . Dental Pain  . Suicidal    HPI [name] is a 57 y.o. male.  Patient with multiple visits to the emergency department for either pain related things or suicidal concerns.  Patient was just evaluated for dental mouth pain concerns at about 3 in the morning.  Was discharged and then brought back and stated he was suicidal.  Patient is still expressing suicidal ideation.  States he will kill himself.  Patient also a history of homelessness.  Spent polysubstance abuse.  Patient initially got very aggressive with the physician assistant.  So I took over care.  Patient seems a bit agitated but will communicate with me.  Still complaining of pain all over including mouth pain and clearly states that he is suicidal.  Did talk about wanting to go to [place] tomorrow I told him that if he is suicidal he will not be able to go because he is going to have to be evaluated and stated that he still was suicidal.  Plan will be to clear him medically and then move him over to psych holding area.      Past Medical History:  Diagnosis Date  . CHF (congestive heart failure) (HCC)   . Chronic foot pain   . Cocaine abuse (HCC)   . Diabetes mellitus without complication (HCC)   . Hepatitis C   . Homelessness   . Homelessness   . Hypertension   . Neuropathy   . Polysubstance abuse (HCC)   . Schizophrenia [facility](HCC)     Patient Active Problem List   Diagnosis Date Noted  . Cocaine abuse with cocaine-induced mood disorder (HCC) 09/18/2017  . Chronic foot pain   . Schizoaffective disorder, bipolar type (HCC) 09/30/2016  . Suicidal thoughts   . Substance induced mood disorder (HCC) 03/13/2015  . Acute kidney failure (HCC) 01/26/2015  . Schizophrenia, paranoid type (HCC) 01/17/2015  . Suicidal  ideation   . Drug hallucinosis (HCC) 10/08/2014  . Chronic paranoid schizophrenia (HCC) 09/07/2014  . 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 Acute CHF (congestive heart failure) (HCC) 07/29/2014  . Cocaine use disorder, severe, dependence (HCC)   . Cocaine abuse (HCC) 03/10/2014  . Essential hypertension, benign 03/28/2013  . Diabetes mellitus (HCC) 03/15/2013    History reviewed. No pertinent surgical history.      Home Medications    Prior to Admission medications   Medication Sig Start Date End Date Taking? Authorizing Provider  acetaminophen (TYLENOL) 500 MG tablet Take 1 tablet (500 mg total) by mouth every 6 (six) hours as needed. Patient not taking: Reported on 12/10/2017 12/07/17   McDonald, Mia A, PA-C  Cetirizine HCl (ZYRTEC ALLERGY) 10 MG CAPS Take 1 capsule (10 mg total) by mouth daily. Patient not taking: Reported on 11/23/2017 10/03/17   Ward, Layla MawKristen N, DO  clotrimazole (LOTRIMIN) 1 % cream Apply to affected area 2 times daily Patient not taking: Reported on 12/18/2017 12/17/17   Roxy HorsemanBrowning, Robert, PA-C  fluticasone [facility](FLONASE) 50 MCG/ACT nasal spray Place 2 sprays into both nostrils daily. Patient not taking: Reported on 11/23/2017 10/03/17   Ward, Layla MawKristen N, DO  hydrochlorothiazide (HYDRODIURIL) 25 MG tablet Take 1 tablet (25 mg total) by mouth daily. Patient not taking: Reported on 11/23/2017 10/13/17  Ward, Layla Maw, DO  lisinopril (PRINIVIL,ZESTRIL) 40 MG tablet Take 1 tablet (40 mg total) by mouth daily. Patient not taking: Reported on 11/23/2017 10/13/17   Ward, Layla Maw, DO    Family History Family History  Problem Relation Age of Onset  . Hypertension Other   . Diabetes Other     Social History Social History   Tobacco Use  . Smoking status: Current Every Day Smoker    Packs/day: 1.00    Years: 20.00    Pack years: 20.00    Types: Cigarettes  . Smokeless tobacco: Current User    Substance Use Topics  . Alcohol use: Yes    Comment: Daily Drinker   . Drug use: Yes    Frequency: 7.0 times per week    Types: "Crack" cocaine, Cocaine, Marijuana    Comment: Cocaine tonight, Marijuana "a long time"     Allergies   Haldol [haloperidol]   Review of Systems Review of Systems  Unable to perform ROS: Mental status change  Constitutional: Negative for fever.  HENT: Positive for dental problem.   Respiratory: Negative for shortness of breath.   Cardiovascular: Negative for chest pain.  Gastrointestinal: Negative for abdominal pain.  Genitourinary: Negative for dysuria.  Musculoskeletal: Positive for myalgias.  Skin: Negative for wound.  Neurological: Negative for headaches.  Psychiatric/Behavioral: Positive for agitation and suicidal ideas.     Physical Exam Updated Vital Signs BP (!) 177/101 (BP Location: Right Arm)   Pulse 91   Temp 98.5 F (36.9 C) (Oral)   Resp 16   Ht 1.753 m (5\' 9" )   Wt 72.6 kg (160 lb)   SpO2 99%   BMI 23.63 kg/m   Physical Exam  Constitutional: He appears well-developed and well-nourished. No distress.  HENT:  Head: Normocephalic and atraumatic.  Mouth/Throat: Oropharynx is clear and moist.  Patient has very poor dentition missing many teeth.  But no evidence of any acute abscess.  Neck without any swelling no adenopathy.  Eyes: Pupils are equal, round, and reactive to light. Conjunctivae and EOM are normal.  Neck: Normal range of motion. Neck supple.  Cardiovascular: Normal rate, regular rhythm and normal heart sounds.  Pulmonary/Chest: Effort normal and breath sounds normal.  Abdominal: Soft. Bowel sounds are normal.  Musculoskeletal: Normal range of motion.  Neurological: He is alert. No cranial nerve deficit or sensory deficit. He exhibits normal muscle tone. Coordination normal.  Patient seems to be having some psychosis he is somewhat agitated.  Clearly states that he is suicidal.  Nursing note and vitals  reviewed.    ED Treatments / Results  Labs (all labs ordered are listed, but only abnormal results are displayed) Labs Reviewed  COMPREHENSIVE METABOLIC PANEL - Abnormal; Notable for the following components:      Result Value   Glucose, Bld 172 (*)    Total Protein 6.4 (*)    All other components within normal limits  ACETAMINOPHEN LEVEL - Abnormal; Notable for the following components:   Acetaminophen (Tylenol), Serum <10 (*)    All other components within normal limits  CBC - Abnormal; Notable for the following components:   RBC 4.18 (*)    Hemoglobin 12.1 (*)    All other components within normal limits  ETHANOL  SALICYLATE LEVEL  RAPID URINE DRUG SCREEN, HOSP PERFORMED    EKG None  Radiology No results found.  Procedures Procedures (including critical care time)  Medications Ordered in ED Medications  acetaminophen (TYLENOL) tablet 650 mg (650 mg  Oral Given 12/30/17 1713)     Initial Impression / Assessment and Plan / ED Course  I have reviewed the triage vital signs and the nursing notes.  Pertinent labs & imaging results that were available during my care of the patient were reviewed by me and considered in my medical decision making (see chart for details).     Although patient has been seen frequently.  Do feel patient with his past medical history of schizophrenia and the suicidal thoughts does warrant behavioral health evaluation.  Patient clearly stated that he was suicidal and he would definitely kill himself.  Patient paperwork for involuntary commitment completed.  In addition patient seems not to be on any behavioral health medications based on his medication list.  Final Clinical Impressions(s) / ED Diagnoses   Final diagnoses:  Suicidal ideation  Polysubstance abuse (HCC)  Schizophrenia, unspecified type [facility])    ED Discharge Orders    None       Vanetta Mulders, MD 12/30/17 1725    Vanetta Mulders, MD 12/30/17 1727     Vanetta Mulders, MD 12/30/17 1756

## 2017-12-30 NOTE — Consult Note (Addendum)
[facility] Face-to-Face Psychiatry Consult   Reason for Consult:  Cocaine abuse with suicidal ideations Referring Physician:  EDP Patient Identification: [name] MRN:  [id number] Principal Diagnosis: Cocaine abuse with cocaine-induced mood disorder [facility]) Diagnosis:   Patient Active Problem List   Diagnosis Date Noted  . Cocaine abuse with cocaine-induced mood disorder [facility]) [F14.14] 09/18/2017    Priority: High  . Schizophrenia, paranoid type ([place]) [F20.0] 01/17/2015    Priority: High  . Drug hallucinosis [facility]) [F19.951] 10/08/2014    Priority: High  . Chronic paranoid schizophrenia ([place]) [F20.0] 09/07/2014    Priority: High  . Cocaine use disorder, severe, dependence ([place]) [F14.20]     Priority: High  . Cocaine abuse ([place]) [F14.10] 03/10/2014    Priority: High  . Chronic foot pain [M79.673, G89.29]   . Schizoaffective disorder, bipolar type ([place]) [F25.0] 09/30/2016  . Suicidal thoughts [R45.851]   . Substance induced mood disorder ([place]) [F19.94] 03/13/2015  . Acute kidney failure ([place]) [N17.9] 01/26/2015  . Suicidal ideation [R45.851]   . Substance or medication-induced bipolar and related disorder with onset during intoxication ([place]) [I32.549, F19.94] 08/10/2014  . Acute CHF (congestive heart failure) ([place]) [I50.9] 07/29/2014  . Essential hypertension, benign [I10] 03/28/2013  . Diabetes mellitus ([place]) [E11.9] 03/15/2013    Total Time spent with patient: 45 minutes  Subjective:   [name] is a 57 y.o. male patient will be stabilized and discharged in the am.  HPI:  57 yo male who presented to the ED after using cocaine and having suicidal ideations.  He is well known to this ED and this provider for a multitude of similar presentations.  His big issue is being homeless and unfortunately uses the ED for shelter and food despite provided with a plethora of resources.  He unfortunately chooses to continue to use cocaine.  He presented to the ED after using  cocaine on a daily basis except during his hospital visits.  He started using in his thirties, sporadically and then daily over the past few years.  The voices make his use worse but then the cocaine makes his hallucinations worse, vicious cycle.  Denies suicidal/homicidal ideations at this time with no hallucinations but obviously still impaired.  Frequent requests for food.  Agreeable to plan for him to sleep and discharge in the am.  Due to his excessive use of emergency services, abuse of emergency services should be explored and explained to him along with legal charges with association prior to discharge tomorrow with encouragement to use his outpatient resources.  Past Psychiatric History: substance abuse, schizophrenia  Risk to Self: No Risk to Others:  No Prior Inpatient Therapy:  Multiple Prior Outpatient Therapy:  [name]  Past Medical History:  Past Medical History:  Diagnosis Date  . CHF (congestive heart failure) ([place])   . Chronic foot pain   . Cocaine abuse ([place])   . Diabetes mellitus without complication ([place])   . Hepatitis C   . Homelessness   . Homelessness   . Hypertension   . Neuropathy   . Polysubstance abuse ([place])   . Schizophrenia ([place])    History reviewed. No pertinent surgical history. Family History:  Family History  Problem Relation Age of Onset  . Hypertension Other   . Diabetes Other    Family Psychiatric  History: none Social History:  Social History   Substance and Sexual Activity  Alcohol Use Yes   Comment: Daily Drinker      Social History   Substance and  Sexual Activity  Drug Use Yes  . Frequency: 7.0 times per week  . Types: "Crack" cocaine, Cocaine, Marijuana   Comment: Cocaine tonight, Marijuana "a long time"    Social History   Socioeconomic History  . Marital status: Divorced    Spouse name: Not on file  . Number of children: Not on file  . Years of education: Not on file  . Highest education level: Not on file  Occupational  History  . Not on file  Social Needs  . Financial resource strain: Not on file  . Food insecurity:    Worry: Not on file    Inability: Not on file  . Transportation needs:    Medical: Not on file    Non-medical: Not on file  Tobacco Use  . Smoking status: Current Every Day Smoker    Packs/day: 1.00    Years: 20.00    Pack years: 20.00    Types: Cigarettes  . Smokeless tobacco: Current User  Substance and Sexual Activity  . Alcohol use: Yes    Comment: Daily Drinker   . Drug use: Yes    Frequency: 7.0 times per week    Types: "Crack" cocaine, Cocaine, Marijuana    Comment: Cocaine tonight, Marijuana "a long time"  . Sexual activity: Never  Lifestyle  . Physical activity:    Days per week: Not on file    Minutes per session: Not on file  . Stress: Not on file  Relationships  . Social connections:    Talks on phone: Not on file    Gets together: Not on file    Attends religious service: Not on file    Active member of club or organization: Not on file    Attends meetings of clubs or organizations: Not on file    Relationship status: Not on file  Other Topics Concern  . Not on file  Social History Narrative   ** Merged History Encounter **       Additional Social History: N/A    Allergies:   Allergies  Allergen Reactions  . Haldol [Haloperidol] Other (See Comments)    Muscle spasms, loss of voluntary movement. However, pt has taken Thorazine on multiple occasions with no adverse effects.     Labs:  Results for orders placed or performed during the hospital encounter of 12/30/17 (from the past 48 hour(s))  Comprehensive metabolic panel     Status: Abnormal   Collection Time: 12/30/17 11:38 AM  Result Value Ref Range   Sodium 139 135 - 145 mmol/L   Potassium 4.0 3.5 - 5.1 mmol/L   Chloride 107 98 - 111 mmol/L    Comment: Please note change in reference range.   CO2 26 22 - 32 mmol/L   Glucose, Bld 172 (H) 70 - 99 mg/dL    Comment: Please note change in  reference range.   BUN 10 6 - 20 mg/dL    Comment: Please note change in reference range.   Creatinine, Ser 0.92 0.61 - 1.24 mg/dL   Calcium 9.0 8.9 - 10.3 mg/dL   Total Protein 6.4 (L) 6.5 - 8.1 g/dL   Albumin 3.5 3.5 - 5.0 g/dL   AST 16 15 - 41 U/L   ALT 11 0 - 44 U/L    Comment: Please note change in reference range.   Alkaline Phosphatase 77 38 - 126 U/L   Total Bilirubin 1.1 0.3 - 1.2 mg/dL   GFR calc non Af Amer >60 >60 mL/min  GFR calc Af Amer >60 >60 mL/min    Comment: (NOTE) The eGFR has been calculated using the CKD EPI equation. This calculation has not been validated in all clinical situations. eGFR's persistently <60 mL/min signify possible Chronic Kidney Disease.    Anion gap 6 5 - 15    Comment: Performed at [facility] [address], [place], [place] [address]  Ethanol     Status: None   Collection Time: 12/30/17 11:38 AM  Result Value Ref Range   Alcohol, Ethyl (B) <10 <10 mg/dL    Comment: (NOTE) Lowest detectable limit for serum alcohol is 10 mg/dL. For medical purposes only. Performed at [facility], [place] [address] [place], [place] [address]   Salicylate level     Status: None   Collection Time: 12/30/17 11:38 AM  Result Value Ref Range   Salicylate Lvl <7.8 2.8 - 30.0 mg/dL    Comment: Performed at [facility] [address], [place], [place] [address]  Acetaminophen level     Status: Abnormal   Collection Time: 12/30/17 11:38 AM  Result Value Ref Range   Acetaminophen (Tylenol), Serum <10 (L) 10 - 30 ug/mL    Comment: (NOTE) Therapeutic concentrations vary significantly. A range of 10-30 ug/mL  may be an effective concentration for many patients. However, some  are best treated at concentrations outside of this range. Acetaminophen concentrations >150 ug/mL at 4 hours after ingestion  and >50 ug/mL at 12 hours after ingestion are often associated with  toxic reactions. Performed at [facility], [place] [address] [place], [place] [address]   cbc     Status: Abnormal   Collection Time: 12/30/17 11:38 AM  Result Value Ref Range   WBC 5.1 4.0 - 10.5 K/uL   RBC 4.18 (L) 4.22 - 5.81 MIL/uL   Hemoglobin 12.1 (L) 13.0 - 17.0 g/dL   HCT 39.6 39.0 - 52.0 %   MCV 94.7 78.0 - 100.0 fL   MCH 28.9 26.0 - 34.0 pg   MCHC 30.6 30.0 - 36.0 g/dL   RDW 13.9 11.5 - 15.5 %   Platelets 179 150 - 400 K/uL    Comment: Performed at [facility], [place] [address] [place], [place] [address]    Current Facility-Administered Medications  Medication Dose Route Frequency Provider Last Rate Last Dose  . acetaminophen (TYLENOL) tablet 650 mg  650 mg Oral Q4H PRN Fredia Sorrow, MD      . gabapentin (NEURONTIN) capsule 300 mg  300 mg Oral TID Patrecia Pour, NP      . hydrochlorothiazide (HYDRODIURIL) tablet 25 mg  25 mg Oral Daily Fredia Sorrow, MD   25 mg at 12/30/17 1827  . lisinopril (PRINIVIL,ZESTRIL) tablet 40 mg  40 mg Oral Daily Fredia Sorrow, MD   40 mg at 12/30/17 1827  . ondansetron (ZOFRAN) tablet 4 mg  4 mg Oral Q8H PRN Fredia Sorrow, MD      . risperiDONE (RISPERDAL) tablet 1 mg  1 mg Oral BID Patrecia Pour, NP       Current Outpatient Medications  Medication Sig Dispense Refill  . acetaminophen (TYLENOL) 500 MG tablet Take 1 tablet (500 mg total) by mouth every 6 (six) hours as needed. (Patient not taking: Reported on 12/10/2017) 30 tablet 0  . Cetirizine HCl (ZYRTEC ALLERGY) 10 MG CAPS Take 1 capsule (10 mg total) by mouth daily. (Patient not taking: Reported on 11/23/2017) 30 capsule 0  .  clotrimazole (LOTRIMIN) 1 % cream Apply to affected area 2 times daily (Patient not taking: Reported on 12/18/2017) 15 g 0  . fluticasone (FLONASE) 50 MCG/ACT nasal spray Place 2 sprays into both nostrils daily. (Patient not taking: Reported on 11/23/2017) 16 g 0  . hydrochlorothiazide (HYDRODIURIL) 25 MG tablet Take 1 tablet (25 mg total) by mouth daily. (Patient not taking: Reported on 11/23/2017) 30 tablet 0   . lisinopril (PRINIVIL,ZESTRIL) 40 MG tablet Take 1 tablet (40 mg total) by mouth daily. (Patient not taking: Reported on 11/23/2017) 30 tablet 0    Musculoskeletal: Strength & Muscle Tone: within normal limits Gait & Station: normal Patient leans: N/A  Psychiatric Specialty Exam: Physical Exam  Nursing note and vitals reviewed. Constitutional: He is oriented to person, place, and time. He appears well-developed and well-nourished.  HENT:  Head: Normocephalic and atraumatic.  Neck: Normal range of motion.  Respiratory: Effort normal.  Musculoskeletal: Normal range of motion.  Neurological: He is alert and oriented to person, place, and time.  Psychiatric: His speech is normal and behavior is normal. Judgment and thought content normal. His mood appears anxious. His affect is labile. Cognition and memory are impaired.    Review of Systems  HENT: Positive for congestion.   Neurological: Positive for sensory change and headaches.  Psychiatric/Behavioral: Positive for substance abuse. The patient is nervous/anxious.   All other systems reviewed and are negative.   Blood pressure (!) 146/93, pulse 71, temperature 98.5 F (36.9 C), temperature source Oral, resp. rate 20, height '5\' 9"'  (1.753 m), weight 72.6 kg (160 lb), SpO2 95 %.Body mass index is 23.63 kg/m.  General Appearance: Casual  Eye Contact:  Good  Speech:  Normal Rate  Volume:  Normal  Mood:  Irritable  Affect:  Congruent  Thought Process:  Coherent and Descriptions of Associations: Intact  Orientation:  Full (Time, Place, and Person)  Thought Content:  WDL  Suicidal Thoughts:  No  Homicidal Thoughts:  No  Memory:  Immediate;   Fair Recent;   Fair Remote;   Fair  Judgement:  Fair  Insight:  Fair  Psychomotor Activity:  Decreased  Concentration:  Concentration: Fair and Attention Span: Fair  Recall:  [employer] of Knowledge:  Fair  Language:  Good  Akathisia:  No  Handed:  Right  AIMS (if indicated):   N/A   Assets:  Leisure Time Physical Health Resilience  ADL's:  Intact  Cognition:  WNL  Sleep:   N/A     Treatment Plan Summary: Daily contact with patient to assess and evaluate symptoms and progress in treatment, Medication management and Plan cocaine abuse with cocaine induced mood disorder:  -Started Risperdal 1 mg BID for psychosis and mood stabilization -Started gabapentin 300 mg TID for withdrawal symptoms  Disposition: No evidence of imminent risk to self or others at present.    Waylan Boga, NP 12/30/2017 6:31 PM   Patient seen face-to-face for psychiatric evaluation, chart reviewed and case discussed with the physician extender and developed treatment plan. Reviewed the information documented and agree with the treatment plan.  Buford Dresser, DO 12/31/17 12:05 PM

## 2017-12-31 NOTE — ED Notes (Signed)
ivc paper work rescinded by Dr. Veleta MinersPlunkette and faxed to [place]

## 2018-01-03 ENCOUNTER — Emergency Department (HOSPITAL_COMMUNITY): Payer: Medicaid Other

## 2018-01-03 ENCOUNTER — Other Ambulatory Visit: Payer: Self-pay

## 2018-01-03 ENCOUNTER — Emergency Department (HOSPITAL_COMMUNITY)
Admission: EM | Admit: 2018-01-03 | Discharge: 2018-01-03 | Discharge status: Against Medical Advice | Payer: Medicaid Other | Attending: Emergency Medicine | Admitting: Emergency Medicine

## 2018-01-03 ENCOUNTER — Encounter (HOSPITAL_COMMUNITY): Payer: Self-pay | Admitting: Emergency Medicine

## 2018-01-03 DIAGNOSIS — I110 Hypertensive heart disease with heart failure: Secondary | ICD-10-CM | POA: Diagnosis not present

## 2018-01-03 DIAGNOSIS — I509 Heart failure, unspecified: Secondary | ICD-10-CM | POA: Diagnosis not present

## 2018-01-03 DIAGNOSIS — E119 Type 2 diabetes mellitus without complications: Secondary | ICD-10-CM | POA: Diagnosis not present

## 2018-01-03 DIAGNOSIS — Z5900 Homelessness unspecified: Secondary | ICD-10-CM

## 2018-01-03 DIAGNOSIS — R079 Chest pain, unspecified: Secondary | ICD-10-CM | POA: Diagnosis present

## 2018-01-03 DIAGNOSIS — F17210 Nicotine dependence, cigarettes, uncomplicated: Secondary | ICD-10-CM | POA: Diagnosis not present

## 2018-01-03 DIAGNOSIS — Z79899 Other long term (current) drug therapy: Secondary | ICD-10-CM | POA: Diagnosis not present

## 2018-01-03 LAB — BASIC METABOLIC PANEL
Anion gap: 9 (ref 5–15)
BUN: 11 mg/dL (ref 6–20)
CHLORIDE: 107 mmol/L (ref 98–111)
CO2: 27 mmol/L (ref 22–32)
Calcium: 9.4 mg/dL (ref 8.9–10.3)
Creatinine, Ser: 0.97 mg/dL (ref 0.61–1.24)
Glucose, Bld: 104 mg/dL — ABNORMAL HIGH (ref 70–99)
POTASSIUM: 3.5 mmol/L (ref 3.5–5.1)
SODIUM: 143 mmol/L (ref 135–145)

## 2018-01-03 LAB — CBC
HEMATOCRIT: 39.6 % (ref 39.0–52.0)
Hemoglobin: 12.1 g/dL — ABNORMAL LOW (ref 13.0–17.0)
MCH: 28.5 pg (ref 26.0–34.0)
MCHC: 30.6 g/dL (ref 30.0–36.0)
MCV: 93.4 fL (ref 78.0–100.0)
PLATELETS: 181 10*3/uL (ref 150–400)
RBC: 4.24 MIL/uL (ref 4.22–5.81)
RDW: 13.7 % (ref 11.5–15.5)
WBC: 5.9 10*3/uL (ref 4.0–10.5)

## 2018-01-03 LAB — I-STAT TROPONIN, ED: Troponin i, poc: 0.06 ng/mL (ref 0.00–0.08)

## 2018-01-03 NOTE — ED Triage Notes (Signed)
Pt standing in the room nude . Staff observed  Urine and stool covering most of the floor. Pt standing waving his arms and shouting I need a shower . Staff reported to Pt. There was a Male NT that will take him to the shower and stay with him to prevent falls. Pt countiued to speak in a loud voice and walked into hall nude and refused to return to room. Pt reported he wanted his food. Staff reported to PT his will be provided after EDP has reviewed lab results .

## 2018-01-03 NOTE — ED Triage Notes (Signed)
Pt provided Blue scrubs after he refused multiple offers of shower. [employer] walked Pt out of the ED . Pt refusing to stay because he did not get a shower or food. Staff reported to PT multiple  times he may take a shower but refuses. Pt room floor covered with urine and stool.

## 2018-01-03 NOTE — ED Notes (Signed)
Pt cursing at staff and being verbally abusive. Staff offered to clean Pt up either by bed bath or shower. Pt refused screaming and stating the nurses are "fucking bitches and I am leaving". Security called.

## 2018-01-03 NOTE — ED Triage Notes (Signed)
Pt arrives to ED from outside a gas station with complaints of chest pain since this morning. EMS reports [employer] was called about pt walking around, pt stated he had CP. In route to hospital pt fell asleep and informed EMS he had no more pain. Pt has psych meds he has not taken since May. Pt placed in position of comfort with bed locked and lowered, call bell in reach.

## 2018-01-03 NOTE — ED Provider Notes (Signed)
[facility] EMERGENCY DEPARTMENT Provider Note   CSN: [id number] Arrival date & time: 01/03/18  4098     History   Chief Complaint Chief Complaint  Patient presents with  . Chest Pain    HPI [name] is a 57 y.o. male.  This is a 57 year old male with long-standing history of homelessness, polysubstance abuse, diabetes, hep C, hypertension, and schizophrenia who is presenting today for repeat evaluation.  Patient with multiple prior visits to this facility.  His most recent visit was 4 days prior.  His complaint today appears to be chest pain.  He is not the best historian, and details regarding his complaint today are vague.  He denies current chest pain.  It appears that he was more uncomfortable earlier today when he was loitering at a gas station and law enforcement was called.  The patient complained of chest pain at that time and was then transported to the hospital for evaluation. It is difficult to get any specifics from the patient regarding his symptoms prior to his arrival in the ED this morning.   He was asleep at the time of this provider's initial evaluation. He is no acute distress.   The history is provided by medical records and the patient.  Chest Pain   This is a recurrent problem. The current episode started 1 to 2 hours ago. The problem occurs rarely. The problem has been resolved. Pain location: anterior chest  The patient is experiencing no pain. Quality: uncertain. The pain does not radiate. Episode Length: unclear. He has tried nothing for the symptoms. The treatment provided no relief.    Past Medical History:  Diagnosis Date  . CHF (congestive heart failure) (HCC)   . Chronic foot pain   . Cocaine abuse ([facility])   . Diabetes mellitus without complication ([facility])   . Hepatitis C   . Homelessness   . Homelessness   . Hypertension   . Neuropathy   . Polysubstance abuse ([facility])   . Schizophrenia [facility])     Patient Active Problem  List   Diagnosis Date Noted  . Cocaine abuse with cocaine-induced mood disorder ([facility]) 09/18/2017  . Chronic foot pain   . Schizoaffective disorder, bipolar type (HCC) 09/30/2016  . Suicidal thoughts   . Substance induced mood disorder ([facility]) 03/13/2015  . Acute kidney failure (HCC) 01/26/2015  . Schizophrenia, paranoid type (H[facility]) 01/17/2015  . Suicidal ideation   . Drug hallucinosis (H[facility]) 10/08/2014  . Chronic paranoid schizophrenia (HCC) 09/07/2014  . 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 Acute CHF (congestive heart failure) (H[facility]) 07/29/2014  . Cocaine use disorder, severe, dependence (HCC)   . Cocaine abuse (HCC) 03/10/2014  . Essential hypertension, benign 03/28/2013  . Diabetes mellitus (HCC) 03/15/2013    History reviewed. No pertinent surgical history.      Home Medications    Prior to Admission medications   Medication Sig Start Date End Date Taking? Authorizing Provider  acetaminophen (TYLENOL) 500 MG tablet Take 1 tablet (500 mg total) by mouth every 6 (six) hours as needed. Patient not taking: Reported on 12/10/2017 12/07/17   McDonald, Mia A, PA-C  Cetirizine HCl (ZYRTEC ALLERGY) 10 MG CAPS Take 1 capsule (10 mg total) by mouth daily. Patient not taking: Reported on 11/23/2017 10/03/17   Ward, Layla Maw, DO  clotrimazole (LOTRIMIN) 1 % cream Apply to affected area 2 times daily Patient not taking: Reported on 12/18/2017 12/17/17  Roxy Horseman, PA-C  fluticasone [facility]) 50 MCG/ACT nasal spray Place 2 sprays into both nostrils daily. Patient not taking: Reported on 11/23/2017 10/03/17   Ward, Layla Maw, DO  hydrochlorothiazide (HYDRODIURIL) 25 MG tablet Take 1 tablet (25 mg total) by mouth daily. Patient not taking: Reported on 11/23/2017 10/13/17   Ward, Layla Maw, DO  lisinopril (PRINIVIL,ZESTRIL) 40 MG tablet Take 1 tablet (40 mg total) by mouth daily. Patient not taking: Reported on 11/23/2017  10/13/17   Ward, Layla Maw, DO    Family History Family History  Problem Relation Age of Onset  . Hypertension Other   . Diabetes Other     Social History Social History   Tobacco Use  . Smoking status: Current Every Day Smoker    Packs/day: 1.00    Years: 20.00    Pack years: 20.00    Types: Cigarettes  . Smokeless tobacco: Current User  Substance Use Topics  . Alcohol use: Yes    Comment: Daily Drinker   . Drug use: Yes    Frequency: 7.0 times per week    Types: "Crack" cocaine, Cocaine, Marijuana    Comment: Cocaine tonight, Marijuana "a long time"     Allergies   Haldol [haloperidol]   Review of Systems Review of Systems  Cardiovascular: Positive for chest pain.  All other systems reviewed and are negative.    Physical Exam Updated Vital Signs Ht 6\' 1"  (1.854 m)   Wt 72.6 kg (160 lb)   BMI 21.11 kg/m   Physical Exam  Constitutional: He is oriented to person, place, and time. He appears well-developed and well-nourished. No distress.  Unkempt    HENT:  Head: Normocephalic and atraumatic.  Mouth/Throat: Oropharynx is clear and moist.  Eyes: Pupils are equal, round, and reactive to light. Conjunctivae and EOM are normal.  Neck: Normal range of motion. Neck supple.  Cardiovascular: Normal rate, regular rhythm and normal heart sounds.  Pulmonary/Chest: Effort normal and breath sounds normal. No respiratory distress.  Abdominal: Soft. He exhibits no distension. There is no tenderness.  Musculoskeletal: Normal range of motion. He exhibits no edema or deformity.  Neurological: He is alert and oriented to person, place, and time.  Skin: Skin is warm and dry. No abrasion noted.  Psychiatric: He has a normal mood and affect.  Nursing note and vitals reviewed.    ED Treatments / Results  Labs (all labs ordered are listed, but only abnormal results are displayed) Labs Reviewed  BASIC METABOLIC PANEL  CBC  I-STAT TROPONIN, ED    EKG EKG  Interpretation  Date/Time:  Saturday January 03 2018 06:02:52 EDT Ventricular Rate:  79 PR Interval:    QRS Duration: 90 QT Interval:  424 QTC Calculation: 487 R Axis:   -19 Text Interpretation:  Sinus rhythm Atrial premature complex Probable left atrial enlargement Anterior infarct, old Abnormal T, consider ischemia, lateral leads No significant change was found Confirmed by Glynn Octave [id number]) on 01/03/2018 6:09:22 AM   Radiology No results found.  Procedures Procedures (including critical care time)  Medications Ordered in ED Medications - No data to display   Initial Impression / Assessment and Plan / ED Course  I have reviewed the triage vital signs and the nursing notes.  Pertinent labs & imaging results that were available during my care of the patient were reviewed by me and considered in my medical decision making (see chart for details).    MDM  Screen complete  Patient is presenting for evaluation with  possible complaint of chest pain.  Screening EKG is without acute ischemic changes.  Initial troponin is without elevation.  Chest x-ray is without acute process. Presenting symptoms are atypical.  Prior to full completion of the patient's work-up he became irate the nursing staff were not allowing him to go to the shower to clean out.  He was upset because he had defecated on himself and staff did not want him to be in a situation where he could slip and fall. He was also upset that he had not gotten any breakfast yet.  Despite attempts to calm the patient verbally he became irate and left the ED.  At the time of his leaving AMA, he was AOX4 and had capacity to refuse care.    Final Clinical Impressions(s) / ED Diagnoses   Final diagnoses:  Homeless    ED Discharge Orders    None       Wynetta FinesMessick, Berman Grainger C, MD 01/03/18 [phone]

## 2018-01-03 NOTE — ED Notes (Signed)
Patient yelling and cursing at staff. Patient offered shower however patient verbally aggressive and threatening staff. MD and security at bedside. [employer] called by security to escort patient out. IV removed.

## 2018-01-07 ENCOUNTER — Encounter (HOSPITAL_COMMUNITY): Payer: Self-pay | Admitting: Emergency Medicine

## 2018-01-07 ENCOUNTER — Emergency Department (HOSPITAL_COMMUNITY)
Admission: EM | Admit: 2018-01-07 | Discharge: 2018-01-08 | Disposition: A | Discharge status: Home / Self Care | Payer: Medicaid Other | Attending: Emergency Medicine | Admitting: Emergency Medicine

## 2018-01-07 ENCOUNTER — Other Ambulatory Visit: Payer: Self-pay

## 2018-01-07 ENCOUNTER — Emergency Department (HOSPITAL_COMMUNITY)
Admission: EM | Admit: 2018-01-07 | Discharge: 2018-01-07 | Disposition: A | Discharge status: Home / Self Care | Payer: Self-pay | Attending: Emergency Medicine | Admitting: Emergency Medicine

## 2018-01-07 DIAGNOSIS — Z5321 Procedure and treatment not carried out due to patient leaving prior to being seen by health care provider: Secondary | ICD-10-CM | POA: Diagnosis not present

## 2018-01-07 DIAGNOSIS — K0889 Other specified disorders of teeth and supporting structures: Secondary | ICD-10-CM | POA: Insufficient documentation

## 2018-01-07 DIAGNOSIS — Z0489 Encounter for examination and observation for other specified reasons: Secondary | ICD-10-CM | POA: Insufficient documentation

## 2018-01-07 DIAGNOSIS — R109 Unspecified abdominal pain: Secondary | ICD-10-CM | POA: Insufficient documentation

## 2018-01-07 LAB — CBG MONITORING, ED: GLUCOSE-CAPILLARY: 160 mg/dL — AB (ref 70–99)

## 2018-01-07 NOTE — ED Triage Notes (Signed)
Patient states "I'm just here to get washed up". Patient states "I stink". Patient states I am not suicidal-I just need some food and a shower.

## 2018-01-07 NOTE — ED Triage Notes (Signed)
[facility] with complaints of AVH after using cocaine tonight. [facility]

## 2018-01-07 NOTE — ED Notes (Signed)
Patient wants to leave and go home.

## 2018-01-08 LAB — COMPREHENSIVE METABOLIC PANEL
ALBUMIN: 3.8 g/dL (ref 3.5–5.0)
ALK PHOS: 80 U/L (ref 38–126)
ALT: 10 U/L (ref 0–44)
ANION GAP: 8 (ref 5–15)
AST: 16 U/L (ref 15–41)
BUN: 12 mg/dL (ref 6–20)
CO2: 28 mmol/L (ref 22–32)
Calcium: 9.4 mg/dL (ref 8.9–10.3)
Chloride: 105 mmol/L (ref 98–111)
Creatinine, Ser: 1.09 mg/dL (ref 0.61–1.24)
GFR calc Af Amer: >60 mL/min (ref >60)
GFR calc non Af Amer: >60 mL/min (ref >60)
GLUCOSE: 123 mg/dL — AB (ref 70–99)
POTASSIUM: 3.5 mmol/L (ref 3.5–5.1)
SODIUM: 141 mmol/L (ref 135–145)
Total Bilirubin: 0.9 mg/dL (ref 0.3–1.2)
Total Protein: 6.9 g/dL (ref 6.5–8.1)

## 2018-01-08 LAB — CBC
HEMATOCRIT: 39.9 % (ref 39.0–52.0)
Hemoglobin: 12.3 g/dL — ABNORMAL LOW (ref 13.0–17.0)
MCH: 29 pg (ref 26.0–34.0)
MCHC: 30.8 g/dL (ref 30.0–36.0)
MCV: 94.1 fL (ref 78.0–100.0)
Platelets: 175 10*3/uL (ref 150–400)
RBC: 4.24 MIL/uL (ref 4.22–5.81)
RDW: 13.3 % (ref 11.5–15.5)
WBC: 4.4 10*3/uL (ref 4.0–10.5)

## 2018-01-08 LAB — URINALYSIS, ROUTINE W REFLEX MICROSCOPIC
BACTERIA UA: NONE SEEN
Bilirubin Urine: NEGATIVE
Glucose, UA: NEGATIVE mg/dL
Hgb urine dipstick: NEGATIVE
Ketones, ur: NEGATIVE mg/dL
Leukocytes, UA: NEGATIVE
Nitrite: NEGATIVE
PROTEIN: 30 mg/dL — AB
Specific Gravity, Urine: 1.024 (ref 1.005–1.030)
pH: 5 (ref 5.0–8.0)

## 2018-01-08 LAB — LIPASE, BLOOD: Lipase: 25 U/L (ref 11–51)

## 2018-01-08 NOTE — ED Notes (Signed)
Pt states he is leaving.  States he came in because he had bowel movement in his pants and needed to change.  States he is leaving now because he has a new set of paper scrubs on.

## 2018-01-08 NOTE — ED Triage Notes (Signed)
Pt c/o of an infected mouth and abdominal pain. Pt states the belly pain and swelling has been going on for months because he has to eat out of the garbage.

## 2018-01-10 ENCOUNTER — Other Ambulatory Visit: Payer: Self-pay

## 2018-01-10 ENCOUNTER — Encounter (HOSPITAL_COMMUNITY): Payer: Self-pay

## 2018-01-10 ENCOUNTER — Emergency Department (HOSPITAL_COMMUNITY)
Admission: EM | Admit: 2018-01-10 | Discharge: 2018-01-10 | Disposition: A | Discharge status: Home / Self Care | Payer: Medicaid Other | Attending: Emergency Medicine | Admitting: Emergency Medicine

## 2018-01-10 DIAGNOSIS — I509 Heart failure, unspecified: Secondary | ICD-10-CM | POA: Diagnosis not present

## 2018-01-10 DIAGNOSIS — E119 Type 2 diabetes mellitus without complications: Secondary | ICD-10-CM | POA: Diagnosis not present

## 2018-01-10 DIAGNOSIS — Z590 Homelessness: Secondary | ICD-10-CM | POA: Insufficient documentation

## 2018-01-10 DIAGNOSIS — F17210 Nicotine dependence, cigarettes, uncomplicated: Secondary | ICD-10-CM | POA: Insufficient documentation

## 2018-01-10 DIAGNOSIS — Z87898 Personal history of other specified conditions: Secondary | ICD-10-CM | POA: Insufficient documentation

## 2018-01-10 DIAGNOSIS — I10 Essential (primary) hypertension: Secondary | ICD-10-CM | POA: Diagnosis not present

## 2018-01-10 DIAGNOSIS — F1911 Other psychoactive substance abuse, in remission: Secondary | ICD-10-CM

## 2018-01-10 DIAGNOSIS — Z8659 Personal history of other mental and behavioral disorders: Secondary | ICD-10-CM

## 2018-01-10 DIAGNOSIS — D500 Iron deficiency anemia secondary to blood loss (chronic): Secondary | ICD-10-CM | POA: Insufficient documentation

## 2018-01-10 DIAGNOSIS — R4689 Other symptoms and signs involving appearance and behavior: Secondary | ICD-10-CM | POA: Insufficient documentation

## 2018-01-10 LAB — COMPREHENSIVE METABOLIC PANEL
ALBUMIN: 3.6 g/dL (ref 3.5–5.0)
ALK PHOS: 76 U/L (ref 38–126)
ALT: 9 U/L (ref 0–44)
ANION GAP: 8 (ref 5–15)
AST: 16 U/L (ref 15–41)
BUN: 18 mg/dL (ref 6–20)
CALCIUM: 9.3 mg/dL (ref 8.9–10.3)
CO2: 28 mmol/L (ref 22–32)
Chloride: 108 mmol/L (ref 98–111)
Creatinine, Ser: 1.02 mg/dL (ref 0.61–1.24)
GFR calc Af Amer: >60 mL/min (ref >60)
GFR calc non Af Amer: >60 mL/min (ref >60)
GLUCOSE: 118 mg/dL — AB (ref 70–99)
Potassium: 3.5 mmol/L (ref 3.5–5.1)
Sodium: 144 mmol/L (ref 135–145)
Total Bilirubin: 0.7 mg/dL (ref 0.3–1.2)
Total Protein: 6.8 g/dL (ref 6.5–8.1)

## 2018-01-10 LAB — CBC WITH DIFFERENTIAL/PLATELET
Basophils Absolute: 0 10*3/uL (ref 0.0–0.1)
Basophils Relative: 0 %
EOS ABS: 0.1 10*3/uL (ref 0.0–0.7)
Eosinophils Relative: 1 %
HEMATOCRIT: 37.5 % — AB (ref 39.0–52.0)
HEMOGLOBIN: 11.9 g/dL — AB (ref 13.0–17.0)
Lymphocytes Relative: 25 %
Lymphs Abs: 1.1 10*3/uL (ref 0.7–4.0)
MCH: 29.1 pg (ref 26.0–34.0)
MCHC: 31.7 g/dL (ref 30.0–36.0)
MCV: 91.7 fL (ref 78.0–100.0)
MONOS PCT: 10 %
Monocytes Absolute: 0.5 10*3/uL (ref 0.1–1.0)
NEUTROS ABS: 2.9 10*3/uL (ref 1.7–7.7)
NEUTROS PCT: 64 %
Platelets: 181 10*3/uL (ref 150–400)
RBC: 4.09 MIL/uL — AB (ref 4.22–5.81)
RDW: 13.7 % (ref 11.5–15.5)
WBC: 4.6 10*3/uL (ref 4.0–10.5)

## 2018-01-10 LAB — ETHANOL: Alcohol, Ethyl (B): <10 mg/dL (ref <10)

## 2018-01-10 NOTE — ED Notes (Signed)
Bed: [id number] Expected date:  Expected time:  Means of arrival:  Comments: EMS agitated elderly haldol and versed

## 2018-01-10 NOTE — ED Triage Notes (Signed)
Patient is sedated per EMS

## 2018-01-10 NOTE — ED Notes (Signed)
Pt has not urinated at this time for urine sample.

## 2018-01-10 NOTE — ED Notes (Signed)
Pt still unable to provide urine specimen 

## 2018-01-10 NOTE — ED Provider Notes (Signed)
[facility]-EMERGENCY DEPT Provider Note   CSN: [id number] Arrival date & time: 01/10/18  0019     History   Chief Complaint Chief Complaint  Patient presents with  . Altered Mental Status    HPI [name] is a 57 y.o. male.  The history is provided by the EMS personnel. The history is limited by the condition of the patient (Sedated, currently nonverbal).  He has history of hypertension, diabetes, schizophrenia, polysubstance abuse, heart failure, homelessness and was brought in by EMS.  Apparently, they were called to scene with bystanders noting that the patient seemed to be sick.  He was combative and agitated and required sedation with haloperidol and midazolam.  Patient is currently sedated and unable to answer any questions.  Past Medical History:  Diagnosis Date  . CHF (congestive heart failure) (HCC)   . Chronic foot pain   . Cocaine abuse (HCC)   . Diabetes mellitus without complication (HCC)   . Hepatitis C   . Homelessness   . Homelessness   . Hypertension   . Neuropathy   . Polysubstance abuse (HCC)   . Schizophrenia [facility])     Patient Active Problem List   Diagnosis Date Noted  . Cocaine abuse with cocaine-induced mood disorder (HCC) 09/18/2017  . Chronic foot pain   . Schizoaffective disorder, bipolar type (HCC) 09/30/2016  . Suicidal thoughts   . Substance induced mood disorder (HCC) 03/13/2015  . Acute kidney failure (HCC) 01/26/2015  . Schizophrenia, paranoid type (HCC) 01/17/2015  . Suicidal ideation   . Drug hallucinosis (HCC) 10/08/2014  . Chronic paranoid schizophrenia (HCC) 09/07/2014  . 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 Acute CHF (congestive heart failure) (HCC) 07/29/2014  . Cocaine use disorder, severe, dependence (HCC)   . Cocaine abuse (HCC) 03/10/2014  . Essential hypertension, benign 03/28/2013  . Diabetes mellitus (HCC)  03/15/2013    History reviewed. No pertinent surgical history.      Home Medications    Prior to Admission medications   Medication Sig Start Date End Date Taking? Authorizing Provider  acetaminophen (TYLENOL) 500 MG tablet Take 1 tablet (500 mg total) by mouth every 6 (six) hours as needed. Patient not taking: Reported on 12/10/2017 12/07/17   McDonald, Mia A, PA-C  Cetirizine HCl (ZYRTEC ALLERGY) 10 MG CAPS Take 1 capsule (10 mg total) by mouth daily. Patient not taking: Reported on 11/23/2017 10/03/17   Ward, Layla Maw, DO  clotrimazole (LOTRIMIN) 1 % cream Apply to affected area 2 times daily Patient not taking: Reported on 12/18/2017 12/17/17   Roxy Horseman, PA-C  fluticasone [facility]) 50 MCG/ACT nasal spray Place 2 sprays into both nostrils daily. Patient not taking: Reported on 11/23/2017 10/03/17   Ward, Layla Maw, DO  hydrochlorothiazide (HYDRODIURIL) 25 MG tablet Take 1 tablet (25 mg total) by mouth daily. Patient not taking: Reported on 11/23/2017 10/13/17   Ward, Layla Maw, DO  lisinopril (PRINIVIL,ZESTRIL) 40 MG tablet Take 1 tablet (40 mg total) by mouth daily. Patient not taking: Reported on 11/23/2017 10/13/17   Ward, Layla Maw, DO    Family History Family History  Problem Relation Age of Onset  . Hypertension Other   . Diabetes Other     Social History Social History   Tobacco Use  . Smoking status: Current Every Day Smoker    Packs/day: 1.00    Years: 20.00    Pack years: 20.00    Types: Cigarettes  .  Smokeless tobacco: Current User  Substance Use Topics  . Alcohol use: Yes    Comment: Daily Drinker   . Drug use: Yes    Frequency: 7.0 times per week    Types: "Crack" cocaine, Cocaine, Marijuana    Comment: Cocaine tonight, Marijuana "a long time"     Allergies   Haldol [haloperidol]   Review of Systems Review of Systems  Unable to perform ROS: Mental status change     Physical Exam Updated Vital Signs BP 128/86 (BP Location: Left Arm)   Pulse  83   Temp 97.6 F (36.4 C) (Oral)   Resp 16   SpO2 98%   Physical Exam  Nursing note and vitals reviewed.  57 year old male, resting comfortably and in no acute distress. Vital signs are normal. Oxygen saturation is 98%, which is normal. Head is normocephalic and atraumatic. PERRLA, EOMI. Oropharynx is clear. Neck is nontender and supple without adenopathy or JVD. Back is nontender and there is no CVA tenderness. Lungs are clear without rales, wheezes, or rhonchi. Chest is nontender. Heart has regular rate and rhythm without murmur. Abdomen is soft, flat, nontender without masses or hepatosplenomegaly and peristalsis is normoactive. Extremities have no cyanosis or edema, full range of motion is present. Skin is warm and dry without rash. Neurologic: Sedated and nonresponsive because of that, cranial nerves are grossly intact, there are no gross motor or sensory deficits.  ED Treatments / Results  Labs (all labs ordered are listed, but only abnormal results are displayed) Labs Reviewed  COMPREHENSIVE METABOLIC PANEL - Abnormal; Notable for the following components:      Result Value   Glucose, Bld 118 (*)    All other components within normal limits  CBC WITH DIFFERENTIAL/PLATELET - Abnormal; Notable for the following components:   RBC 4.09 (*)    Hemoglobin 11.9 (*)    HCT 37.5 (*)    All other components within normal limits  ETHANOL  URINALYSIS, ROUTINE W REFLEX MICROSCOPIC  RAPID URINE DRUG SCREEN, HOSP PERFORMED   Procedures Procedures   Medications Ordered in ED Medications - No data to display   Initial Impression / Assessment and Plan / ED Course  I have reviewed the triage vital signs and the nursing notes.  Pertinent labs & imaging results that were available during my care of the patient were reviewed by me and considered in my medical decision making (see chart for details).  Agitation, patient currently sedated with haloperidol and midazolam.  Old records  are reviewed, and he has 46 ED visits in the past 6 months most of which are related to underlying psychiatric problems and homelessness as well as chronic foot pain.  Will check screening labs.  He will need to be reevaluated once sedation has worn off.  8:39 AM Patient is still sleeping and not arousable.  Screening labs are significant for mild anemia which is unchanged from baseline.  Case is signed out to Dr. Denton LankSteinl.  Final Clinical Impressions(s) / ED Diagnoses   Final diagnoses:  Altered mental status, unspecified altered mental status type  Normochromic normocytic anemia    ED Discharge Orders    None       Dione BoozeGlick, Creta Dorame, MD 01/10/18 [phone]

## 2018-01-10 NOTE — ED Notes (Signed)
No rine noted at this time. Pt reminded of need for urine.

## 2018-01-10 NOTE — Discharge Instructions (Signed)
It was our pleasure to provide your ER care today - we hope that you feel better.  Follow up with primary care doctor in the coming week.  For mental health issues and/or crisis, [facility].  Return to ER if worse, new symptoms, fevers, trouble breathing, or other emergency.

## 2018-01-10 NOTE — ED Triage Notes (Signed)
[facility] with complaints of being [place] a gas station-called 911 by bystanders-"sick call with high blood pressure and pain to penis". On EMS arrival patient combative and agitated-patient throwing shoes at EMS and they administered Haldol 5 mg and Versed 5 mg.

## 2018-01-10 NOTE — ED Notes (Signed)
Pt has external condom cath on, awaiting urine specimen.

## 2018-01-10 NOTE — ED Notes (Signed)
Acuity Level 2 due to aggression with EMS/[employer]

## 2018-01-11 ENCOUNTER — Other Ambulatory Visit: Payer: Self-pay

## 2018-01-11 ENCOUNTER — Emergency Department (HOSPITAL_COMMUNITY)
Admission: EM | Admit: 2018-01-11 | Discharge: 2018-01-12 | Disposition: A | Discharge status: Home / Self Care | Payer: Medicaid Other | Attending: Emergency Medicine | Admitting: Emergency Medicine

## 2018-01-11 ENCOUNTER — Encounter (HOSPITAL_COMMUNITY): Payer: Self-pay | Admitting: Emergency Medicine

## 2018-01-11 DIAGNOSIS — Z5900 Homelessness unspecified: Secondary | ICD-10-CM

## 2018-01-11 DIAGNOSIS — E119 Type 2 diabetes mellitus without complications: Secondary | ICD-10-CM | POA: Insufficient documentation

## 2018-01-11 DIAGNOSIS — I509 Heart failure, unspecified: Secondary | ICD-10-CM | POA: Insufficient documentation

## 2018-01-11 DIAGNOSIS — R45851 Suicidal ideations: Secondary | ICD-10-CM | POA: Diagnosis not present

## 2018-01-11 DIAGNOSIS — F17210 Nicotine dependence, cigarettes, uncomplicated: Secondary | ICD-10-CM | POA: Insufficient documentation

## 2018-01-11 DIAGNOSIS — I110 Hypertensive heart disease with heart failure: Secondary | ICD-10-CM | POA: Diagnosis not present

## 2018-01-11 DIAGNOSIS — Z79899 Other long term (current) drug therapy: Secondary | ICD-10-CM | POA: Insufficient documentation

## 2018-01-11 DIAGNOSIS — F200 Paranoid schizophrenia: Secondary | ICD-10-CM | POA: Diagnosis present

## 2018-01-11 NOTE — ED Triage Notes (Signed)
[facility] for [facility] ideation without any plan. Pt reporting that he has been without medication since March and takes Tegretol.

## 2018-01-11 NOTE — ED Notes (Signed)
Bed: WTR5 Expected date:  Expected time:  Means of arrival:  Comments: 

## 2018-01-12 LAB — COMPREHENSIVE METABOLIC PANEL
ALK PHOS: 86 U/L (ref 38–126)
ALT: 10 U/L (ref 0–44)
ANION GAP: 8 (ref 5–15)
AST: 20 U/L (ref 15–41)
Albumin: 3.7 g/dL (ref 3.5–5.0)
BILIRUBIN TOTAL: 0.4 mg/dL (ref 0.3–1.2)
BUN: 14 mg/dL (ref 6–20)
CALCIUM: 9.2 mg/dL (ref 8.9–10.3)
CO2: 27 mmol/L (ref 22–32)
Chloride: 109 mmol/L (ref 98–111)
Creatinine, Ser: 0.97 mg/dL (ref 0.61–1.24)
GFR calc non Af Amer: >60 mL/min (ref >60)
Glucose, Bld: 142 mg/dL — ABNORMAL HIGH (ref 70–99)
Potassium: 3.4 mmol/L — ABNORMAL LOW (ref 3.5–5.1)
SODIUM: 144 mmol/L (ref 135–145)
TOTAL PROTEIN: 6.9 g/dL (ref 6.5–8.1)

## 2018-01-12 LAB — ACETAMINOPHEN LEVEL: Acetaminophen (Tylenol), Serum: <10 ug/mL — ABNORMAL LOW (ref 10–30)

## 2018-01-12 LAB — CBC
HEMATOCRIT: 38.9 % — AB (ref 39.0–52.0)
HEMOGLOBIN: 12.2 g/dL — AB (ref 13.0–17.0)
MCH: 29 pg (ref 26.0–34.0)
MCHC: 31.4 g/dL (ref 30.0–36.0)
MCV: 92.6 fL (ref 78.0–100.0)
Platelets: 183 10*3/uL (ref 150–400)
RBC: 4.2 MIL/uL — AB (ref 4.22–5.81)
RDW: 13.7 % (ref 11.5–15.5)
WBC: 5.7 10*3/uL (ref 4.0–10.5)

## 2018-01-12 LAB — SALICYLATE LEVEL

## 2018-01-12 LAB — ETHANOL: Alcohol, Ethyl (B): <10 mg/dL (ref <10)

## 2018-01-12 NOTE — ED Notes (Signed)
Pt refused V/S this morning.

## 2018-01-12 NOTE — ED Notes (Signed)
Pt received from triage with reports of SI without plan.Pt arrived to unit yelling out for food, and milk. Pt was provided both and specemin cup for sample. Pt went immediately to sleep. Pt denies current SI/ HI, A/V H, depression, anxiety, or pain at this time, and appears otherwise stable and free of distress. Pt reminded of camera surveillance, q 15 min rounds, and rules of the milieu. Will continue to assess.

## 2018-01-12 NOTE — ED Notes (Signed)
Bed: WA30 Expected date:  Expected time:  Means of arrival:  Comments: Triage 5 
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
## 2018-01-12 NOTE — Discharge Instructions (Addendum)
[facility], they can help you get something to eat and your medications.

## 2018-01-22 ENCOUNTER — Emergency Department (HOSPITAL_COMMUNITY)
Admission: EM | Admit: 2018-01-22 | Discharge: 2018-01-23 | Disposition: A | Discharge status: Home / Self Care | Payer: Medicaid Other | Attending: Emergency Medicine | Admitting: Emergency Medicine

## 2018-01-22 DIAGNOSIS — F17210 Nicotine dependence, cigarettes, uncomplicated: Secondary | ICD-10-CM | POA: Insufficient documentation

## 2018-01-22 DIAGNOSIS — I110 Hypertensive heart disease with heart failure: Secondary | ICD-10-CM | POA: Insufficient documentation

## 2018-01-22 DIAGNOSIS — R0789 Other chest pain: Secondary | ICD-10-CM | POA: Insufficient documentation

## 2018-01-22 DIAGNOSIS — E1140 Type 2 diabetes mellitus with diabetic neuropathy, unspecified: Secondary | ICD-10-CM | POA: Diagnosis not present

## 2018-01-22 DIAGNOSIS — R079 Chest pain, unspecified: Secondary | ICD-10-CM | POA: Diagnosis present

## 2018-01-22 DIAGNOSIS — F1490 Cocaine use, unspecified, uncomplicated: Secondary | ICD-10-CM | POA: Diagnosis not present

## 2018-01-22 DIAGNOSIS — I509 Heart failure, unspecified: Secondary | ICD-10-CM | POA: Diagnosis not present

## 2018-01-23 ENCOUNTER — Encounter (HOSPITAL_COMMUNITY): Payer: Self-pay | Admitting: *Deleted

## 2018-01-23 ENCOUNTER — Other Ambulatory Visit: Payer: Self-pay

## 2018-01-23 ENCOUNTER — Emergency Department (HOSPITAL_COMMUNITY): Payer: Medicaid Other

## 2018-01-23 LAB — CBC
HEMATOCRIT: 36.3 % — AB (ref 39.0–52.0)
HEMOGLOBIN: 11.1 g/dL — AB (ref 13.0–17.0)
MCH: 28.8 pg (ref 26.0–34.0)
MCHC: 30.6 g/dL (ref 30.0–36.0)
MCV: 94 fL (ref 78.0–100.0)
Platelets: 163 10*3/uL (ref 150–400)
RBC: 3.86 MIL/uL — AB (ref 4.22–5.81)
RDW: 13 % (ref 11.5–15.5)
WBC: 5 10*3/uL (ref 4.0–10.5)

## 2018-01-23 LAB — HEPATIC FUNCTION PANEL
ALT: 14 U/L (ref 0–44)
AST: 32 U/L (ref 15–41)
Albumin: 3.4 g/dL — ABNORMAL LOW (ref 3.5–5.0)
Alkaline Phosphatase: 76 U/L (ref 38–126)
BILIRUBIN DIRECT: 0.2 mg/dL (ref 0.0–0.2)
Indirect Bilirubin: 0.7 mg/dL (ref 0.3–0.9)
Total Bilirubin: 0.9 mg/dL (ref 0.3–1.2)
Total Protein: 6.3 g/dL — ABNORMAL LOW (ref 6.5–8.1)

## 2018-01-23 LAB — BASIC METABOLIC PANEL
ANION GAP: 6 (ref 5–15)
BUN: 13 mg/dL (ref 6–20)
CHLORIDE: 108 mmol/L (ref 98–111)
CO2: 27 mmol/L (ref 22–32)
Calcium: 9.2 mg/dL (ref 8.9–10.3)
Creatinine, Ser: 0.97 mg/dL (ref 0.61–1.24)
GFR calc Af Amer: >60 mL/min (ref >60)
Glucose, Bld: 169 mg/dL — ABNORMAL HIGH (ref 70–99)
POTASSIUM: 3.6 mmol/L (ref 3.5–5.1)
Sodium: 141 mmol/L (ref 135–145)

## 2018-01-23 LAB — I-STAT TROPONIN, ED
Troponin i, poc: 0.03 ng/mL (ref 0.00–0.08)
Troponin i, poc: 0.04 ng/mL (ref 0.00–0.08)

## 2018-01-23 LAB — LIPASE, BLOOD: Lipase: 23 U/L (ref 11–51)

## 2018-01-23 NOTE — ED Provider Notes (Signed)
[facility] EMERGENCY DEPARTMENT Provider Note  CSN: [id number] Arrival date & time: 01/22/18 2359  Chief Complaint(s) Chest Pain  HPI [name] is a 57 y.o. male with extensive past medical history listed below presents to the emergency department with left-sided point substernal pain following cocaine use.  Patient reports that he is cocaine approximately 1 hour ago.  Shortly after using cocaine he began having the sharp sternal chest pain in the left upper area of the sternum/manubrium.  He denied any associated trauma.  Pain has since resolved.  He also endorses epigastric achiness described as hunger.  States that he has not eaten all day.  No alleviating or aggravating factors.  Denies any associated shortness of breath.  Patient also endorses feeling lightheaded.  This began after receiving aspirin and nitroglycerin by EMS.  Blood pressure following nitroglycerin was noted to be in the 70s.  He was given IV fluids by EMS.  HPI  Past Medical History Past Medical History:  Diagnosis Date  . CHF (congestive heart failure) (HCC)   . Chronic foot pain   . Cocaine abuse (HCC)   . Diabetes mellitus without complication (HCC)   . Hepatitis C   . Homelessness   . Homelessness   . Hypertension   . Neuropathy   . Polysubstance abuse (HCC)   . Schizophrenia [facility])    Patient Active Problem List   Diagnosis Date Noted  . Cocaine abuse with cocaine-induced mood disorder (HCC) 09/18/2017  . Chronic foot pain   . Schizoaffective disorder, bipolar type (HCC) 09/30/2016  . Suicidal thoughts   . Substance induced mood disorder (HCC) 03/13/2015  . Acute kidney failure (HCC) 01/26/2015  . Schizophrenia, paranoid type (HCC) 01/17/2015  . Suicidal ideation   . Drug hallucinosis (HCC) 10/08/2014  . Chronic paranoid schizophrenia (HCC) 09/07/2014  . 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 Acute  CHF (congestive heart failure) (HCC) 07/29/2014  . Cocaine use disorder, severe, dependence (HCC)   . Cocaine abuse (HCC) 03/10/2014  . Essential hypertension, benign 03/28/2013  . Diabetes mellitus (HCC) 03/15/2013   Home Medication(s) Prior to Admission medications   Medication Sig Start Date End Date Taking? Authorizing Provider  acetaminophen (TYLENOL) 500 MG tablet Take 1 tablet (500 mg total) by mouth every 6 (six) hours as needed. Patient not taking: Reported on 12/10/2017 12/07/17   McDonald, Mia A, PA-C  Cetirizine HCl (ZYRTEC ALLERGY) 10 MG CAPS Take 1 capsule (10 mg total) by mouth daily. Patient not taking: Reported on 11/23/2017 10/03/17   Ward, Layla Maw, DO  clotrimazole (LOTRIMIN) 1 % cream Apply to affected area 2 times daily Patient not taking: Reported on 01/12/2018 12/17/17   Roxy Horseman, PA-C  fluticasone [facility]) 50 MCG/ACT nasal spray Place 2 sprays into both nostrils daily. Patient not taking: Reported on 11/23/2017 10/03/17   Ward, Layla Maw, DO  hydrochlorothiazide (HYDRODIURIL) 25 MG tablet Take 1 tablet (25 mg total) by mouth daily. Patient not taking: Reported on 11/23/2017 10/13/17   Ward, Layla Maw, DO  lisinopril (PRINIVIL,ZESTRIL) 40 MG tablet Take 1 tablet (40 mg total) by mouth daily. Patient not taking: Reported on 11/23/2017 10/13/17   Ward, Layla Maw, DO  Past Surgical History History reviewed. No pertinent surgical history. Family History Family History  Problem Relation Age of Onset  . Hypertension Other   . Diabetes Other     Social History Social History   Tobacco Use  . Smoking status: Current Every Day Smoker    Packs/day: 1.00    Years: 20.00    Pack years: 20.00    Types: Cigarettes  . Smokeless tobacco: Current User  Substance Use Topics  . Alcohol use: Yes    Comment: Daily Drinker   . Drug use: Yes     Frequency: 7.0 times per week    Types: "Crack" cocaine, Cocaine, Marijuana    Comment: Cocaine tonight, Marijuana "a long time"   Allergies Haldol [haloperidol]  Review of Systems Review of Systems All other systems are reviewed and are negative for acute change except as noted in the HPI  Physical Exam Vital Signs  I have reviewed the triage vital signs BP (!) 143/89 (BP Location: Right Arm)   Pulse 82   Temp 98.3 F (36.8 C) (Oral)   Resp 18   SpO2 97%   Physical Exam  Constitutional: He is oriented to person, place, and time. He appears well-developed and well-nourished. No distress.  HENT:  Head: Normocephalic and atraumatic.  Nose: Nose normal.  Eyes: Pupils are equal, round, and reactive to light. Conjunctivae and EOM are normal. Right eye exhibits no discharge. Left eye exhibits no discharge. No scleral icterus.  Neck: Normal range of motion. Neck supple.  Cardiovascular: Normal rate and regular rhythm. Exam reveals no gallop and no friction rub.  No murmur heard. Pulmonary/Chest: Effort normal and breath sounds normal. No stridor. No respiratory distress. He has no rales. He exhibits tenderness.    Abdominal: Soft. He exhibits no distension. There is no tenderness. There is no rigidity, no rebound and no guarding.  Musculoskeletal: He exhibits no edema or tenderness.  Neurological: He is alert and oriented to person, place, and time.  Skin: Skin is warm and dry. No rash noted. He is not diaphoretic. No erythema.  Psychiatric: He has a normal mood and affect.  Vitals reviewed.   ED Results and Treatments Labs (all labs ordered are listed, but only abnormal results are displayed) Labs Reviewed  BASIC METABOLIC PANEL - Abnormal; Notable for the following components:      Result Value   Glucose, Bld 169 (*)    All other components within normal limits  CBC - Abnormal; Notable for the following components:   RBC 3.86 (*)    Hemoglobin 11.1 (*)    HCT 36.3 (*)      All other components within normal limits  HEPATIC FUNCTION PANEL - Abnormal; Notable for the following components:   Total Protein 6.3 (*)    Albumin 3.4 (*)    All other components within normal limits  LIPASE, BLOOD  I-STAT TROPONIN, ED  I-STAT TROPONIN, ED  EKG  EKG Interpretation  Date/Time:  Friday January 23 2018 00:05:30 EDT Ventricular Rate:  55 PR Interval:    QRS Duration: 86 QT Interval:  512 QTC Calculation: 489 R Axis:   1 Text Interpretation:  Unusual P axis, possible ectopic atrial bradycardia with A-V dissociation and Junctional rhythm with Sinus/atrial capture Minimal voltage criteria for LVH, may be normal variant Septal infarct , age undetermined ST & Marked T wave abnormality, consider anterolateral ischemia Abnormal ECG Reconfirmed by Drema Pry [phone]) on 01/23/2018 4:26:51 AM      Radiology Dg Chest 2 View  Result Date: 01/23/2018 CLINICAL DATA:  Chest and abdominal pain after crack use today. EXAM: CHEST - 2 VIEW COMPARISON:  01/03/2018 FINDINGS: Cardiac enlargement. Pulmonary vascularity is normal. No airspace disease or consolidation in the lungs. No blunting of costophrenic angles. No pneumothorax. Mediastinal contours appear intact. Calcification of the aorta. Degenerative changes in the spine. IMPRESSION: Cardiac enlargement. No evidence of active pulmonary disease. Electronically Signed   By: Burman Nieves M.D.   On: 01/23/2018 00:54   Pertinent labs & imaging results that were available during my care of the patient were reviewed by me and considered in my medical decision making (see chart for details).  Medications Ordered in ED Medications - No data to display                                                                                                                                  Procedures Procedures  (including  critical care time)  Medical Decision Making / ED Course I have reviewed the nursing notes for this encounter and the patient's prior records (if available in EHR or on provided paperwork).    Highly atypical chest pain.  Most suspicious for MSK.  Atypical for ACS.  EKG without acute ischemic changes or evidence of pericarditis.  Similar T wave changes as prior. Chest x-ray without evidence suggestive of pneumonia, pneumothorax, pneumomediastinum.  No abnormal contour of the mediastinum to suggest dissection. No evidence of acute injuries.  Patient is currently only having chest discomfort with palpation of the anterior chest two-point tenderness in the area noted above.  Otherwise asymptomatic.  Presentation not classic for aortic dissection or esophageal perforation.  Abdomen benign.  Doubt serious intra-abdominal inflammatory/infectious process.   Labs grossly reassuring with negative troponin x2.  Patient was monitored for several hours without recurrence of symptoms.  Doubt cardiac etiology.  Doubt pulmonary embolism.  Serial blood pressures reassuring.  He was able to tolerate oral intake.  The patient appears reasonably screened and/or stabilized for discharge and I doubt any other medical condition or other [facility] requiring further screening, evaluation, or treatment in the ED at this time prior to discharge.  The patient is safe for discharge with strict return precautions.   Final Clinical Impression(s) / ED Diagnoses Final diagnoses:  Chest wall pain  Cocaine use    Disposition: Discharge  Condition:  Good  I have discussed the results, Dx and Tx plan with the patient who expressed understanding and agree(s) with the plan. Discharge instructions discussed at great length. The patient was given strict return precautions who verbalized understanding of the instructions. No further questions at time of discharge.    ED Discharge Orders    None       Follow Up: Hilbert Corrigan [name], NP [address] [place] [address] [phone]  Schedule an appointment as soon as possible for a visit  As needed     This chart was dictated using voice recognition software.  Despite best efforts to proofread,  errors can occur which can change the documentation meaning.   Nira Conn, MD 01/23/18 [phone]

## 2018-01-23 NOTE — ED Notes (Signed)
Pt. Unable to sign. Pt did not have further questions

## 2018-01-23 NOTE — ED Triage Notes (Signed)
Pt brought in from  [facility] by EMS for chest and abd pain that started after crack use. Pt c/o dizziness and feeling lightheaded. BP noted to be 70s systolic. EMS gave 324mg  ASA and nitro x1 pta. Fluid bolus started in triage for hypotension.

## 2018-01-26 ENCOUNTER — Emergency Department (HOSPITAL_COMMUNITY)
Admission: EM | Admit: 2018-01-26 | Discharge: 2018-01-26 | Disposition: A | Discharge status: Home / Self Care | Payer: Medicaid Other | Attending: Emergency Medicine | Admitting: Emergency Medicine

## 2018-01-26 ENCOUNTER — Encounter (HOSPITAL_COMMUNITY): Payer: Self-pay | Admitting: Emergency Medicine

## 2018-01-26 ENCOUNTER — Other Ambulatory Visit: Payer: Self-pay

## 2018-01-26 DIAGNOSIS — F1410 Cocaine abuse, uncomplicated: Secondary | ICD-10-CM

## 2018-01-26 DIAGNOSIS — Z590 Homelessness: Secondary | ICD-10-CM | POA: Insufficient documentation

## 2018-01-26 DIAGNOSIS — I110 Hypertensive heart disease with heart failure: Secondary | ICD-10-CM | POA: Diagnosis not present

## 2018-01-26 DIAGNOSIS — R45851 Suicidal ideations: Secondary | ICD-10-CM | POA: Insufficient documentation

## 2018-01-26 DIAGNOSIS — F17210 Nicotine dependence, cigarettes, uncomplicated: Secondary | ICD-10-CM | POA: Insufficient documentation

## 2018-01-26 DIAGNOSIS — I509 Heart failure, unspecified: Secondary | ICD-10-CM | POA: Diagnosis not present

## 2018-01-26 DIAGNOSIS — F1414 Cocaine abuse with cocaine-induced mood disorder: Secondary | ICD-10-CM | POA: Diagnosis not present

## 2018-01-26 DIAGNOSIS — Z046 Encounter for general psychiatric examination, requested by authority: Secondary | ICD-10-CM | POA: Insufficient documentation

## 2018-01-26 DIAGNOSIS — Z9114 Patient's other noncompliance with medication regimen: Secondary | ICD-10-CM | POA: Diagnosis not present

## 2018-01-26 DIAGNOSIS — R51 Headache: Secondary | ICD-10-CM | POA: Diagnosis present

## 2018-01-26 DIAGNOSIS — E119 Type 2 diabetes mellitus without complications: Secondary | ICD-10-CM | POA: Diagnosis not present

## 2018-01-26 LAB — COMPREHENSIVE METABOLIC PANEL
ALK PHOS: 78 U/L (ref 38–126)
ALT: 15 U/L (ref 0–44)
AST: 22 U/L (ref 15–41)
Albumin: 3.6 g/dL (ref 3.5–5.0)
Anion gap: 7 (ref 5–15)
BUN: 6 mg/dL (ref 6–20)
CHLORIDE: 110 mmol/L (ref 98–111)
CO2: 26 mmol/L (ref 22–32)
Calcium: 9 mg/dL (ref 8.9–10.3)
Creatinine, Ser: 0.86 mg/dL (ref 0.61–1.24)
Glucose, Bld: 148 mg/dL — ABNORMAL HIGH (ref 70–99)
Potassium: 3.3 mmol/L — ABNORMAL LOW (ref 3.5–5.1)
Sodium: 143 mmol/L (ref 135–145)
Total Bilirubin: 0.9 mg/dL (ref 0.3–1.2)
Total Protein: 6.7 g/dL (ref 6.5–8.1)

## 2018-01-26 LAB — CBC
HCT: 35.3 % — ABNORMAL LOW (ref 39.0–52.0)
HEMOGLOBIN: 11.4 g/dL — AB (ref 13.0–17.0)
MCH: 29 pg (ref 26.0–34.0)
MCHC: 32.3 g/dL (ref 30.0–36.0)
MCV: 89.8 fL (ref 78.0–100.0)
PLATELETS: 203 10*3/uL (ref 150–400)
RBC: 3.93 MIL/uL — AB (ref 4.22–5.81)
RDW: 13.4 % (ref 11.5–15.5)
WBC: 8.2 10*3/uL (ref 4.0–10.5)

## 2018-01-26 LAB — SALICYLATE LEVEL: Salicylate Lvl: <7 mg/dL (ref 2.8–30.0)

## 2018-01-26 LAB — RAPID URINE DRUG SCREEN, HOSP PERFORMED
AMPHETAMINES: NOT DETECTED
Barbiturates: NOT DETECTED
Benzodiazepines: NOT DETECTED
Cocaine: POSITIVE — AB
Opiates: NOT DETECTED
Tetrahydrocannabinol: NOT DETECTED

## 2018-01-26 LAB — ETHANOL: Alcohol, Ethyl (B): <10 mg/dL (ref <10)

## 2018-01-26 LAB — ACETAMINOPHEN LEVEL: Acetaminophen (Tylenol), Serum: <10 ug/mL — ABNORMAL LOW (ref 10–30)

## 2018-01-26 MED ORDER — LORAZEPAM 2 MG/ML IJ SOLN: 0.0000 mg | Freq: Four times a day (QID) | INTRAMUSCULAR | Status: DC

## 2018-01-26 MED ORDER — LORAZEPAM 1 MG PO TABS: 0.0000 mg | ORAL_TABLET | Freq: Four times a day (QID) | ORAL | Status: DC

## 2018-01-26 MED ORDER — ONDANSETRON HCL 4 MG PO TABS: 4.0000 mg | ORAL_TABLET | Freq: Three times a day (TID) | ORAL | Status: DC | PRN

## 2018-01-26 MED ORDER — THIAMINE HCL 100 MG/ML IJ SOLN: 100.0000 mg | Freq: Every day | INTRAMUSCULAR | Status: DC

## 2018-01-26 MED ORDER — NICOTINE 21 MG/24HR TD PT24: 21.0000 mg | MEDICATED_PATCH | Freq: Every day | TRANSDERMAL | Status: DC

## 2018-01-26 MED ORDER — VITAMIN B-1 100 MG PO TABS: 100.0000 mg | ORAL_TABLET | Freq: Every day | ORAL | Status: DC

## 2018-01-26 MED ORDER — ALUM & MAG HYDROXIDE-SIMETH 200-200-20 MG/5ML PO SUSP: 30.0000 mL | Freq: Four times a day (QID) | ORAL | Status: DC | PRN

## 2018-01-26 NOTE — Discharge Instructions (Signed)
For your behavioral health needs, you are advised to follow up with one of the following providers.  Contact them at your earliest opportunity to enroll in their program:       [facility]      [address]      [place], [place] [address]      [phone]      New patients are seen at their walk-in clinic.  Walk-in hours are Monday - Friday from 8:00 am - 12:00 pm, and from 1:00 pm - 3:00 pm.  Walk-in patients are seen on a first come, first served basis, so try to arrive as early as possible for the best chance of being seen the same day.  There is an initial fee of $22.50.       [name]      [address]      [place], [place] [address]      [phone]

## 2018-01-26 NOTE — ED Notes (Signed)
Pt is awaiting reassessment by counselor this am.

## 2018-01-26 NOTE — BH Assessment (Addendum)
Donell SievertSpencer Simon, PA, recommends observation for safety. Reevaluation in a.m by psychiatry and discharge if no safety concerns. Dr. Doug SouMulpus and RN notified of disposition.

## 2018-01-26 NOTE — ED Triage Notes (Signed)
Pt reports being suicidal and wanting to jump in front of a car. Pt reports to not having mediation in over a week.

## 2018-01-26 NOTE — ED Notes (Signed)
Pt resting comfortably at this time. NAD noted. Equal rise and fall of chest noted

## 2018-01-26 NOTE — BH Assessment (Signed)
Assessment Note  [name] is an 57 y.o. male with history of schizophrenia, polysubstance abuse and homelessness with frequent visits to the ED for suicidal ideation.  Patient reports contemplating suicide because of multiple stressors in his life.  Patient reports considering "anyway" of doing this but mentions jumping in front of a car.  Patient admits to using cocaine and alcohol earlier.  Patient reports having some facial pain but no chest pain or abdominal pain.  Patient admits to not having his psychiatric medication in over a week.  Patient continues to ask for more food throughout assessment stating that he is hungry. Patient answered most questions, unable to assess with other questions. Patient was drowsy.   Disposition:  Disposition Initial Assessment Completed for this Encounter: Yes  [name], PA, observation for safety. Reevaluation in a.m by psychiatry and discharge if no safety concerns. Dr. Doug Sou and RN notified of disposition.  Diagnosis: F20.9 Schizophrenia; F14.20 Cocaine use d/o severe   Past Medical History:  Past Medical History:  Diagnosis Date  . CHF (congestive heart failure) (HCC)   . Chronic foot pain   . Cocaine abuse (HCC)   . Diabetes mellitus without complication (HCC)   . Hepatitis C   . Homelessness   . Hypertension   . Neuropathy   . Polysubstance abuse (HCC)   . Schizophrenia (HCC)     History reviewed. No pertinent surgical history.  Family History:  Family History  Problem Relation Age of Onset  . Hypertension Other   . Diabetes Other     Social History:  reports that he has been smoking cigarettes.  He has a 20.00 pack-year smoking history. He uses smokeless tobacco. He reports that he drinks alcohol. He reports that he has current or past drug history. Drugs: "Crack" cocaine, Cocaine, and Marijuana. Frequency: 7.00 times per week.  Additional Social History:     CIWA: CIWA-Ar BP: (!) 162/100 Pulse Rate: (!)  103 COWS:    Allergies:  Allergies  Allergen Reactions  . Haldol [Haloperidol] Other (See Comments)    Muscle spasms, loss of voluntary movement. However, pt has taken Thorazine on multiple occasions with no adverse effects.     Home Medications:  (Not in a hospital admission)  OB/GYN Status:  No LMP for male patient.  General Assessment Data Location of Assessment: [facility] ED TTS Assessment: In system Is this a Tele or Face-to-Face Assessment?: Face-to-Face Is this an Initial Assessment or a Re-assessment for this encounter?: Initial Assessment Marital status: Single Is patient pregnant?: No Pregnancy Status: No Living Arrangements: (homeless) Can pt return to current living arrangement?: (homeless) Admission Status: Voluntary Is patient capable of signing voluntary admission?: Yes Referral Source: Self/Family/Friend  Medical Screening Exam [facility] Walk-in ONLY) Medical Exam completed: Yes  Crisis Care Plan Living Arrangements: (homeless) Legal Guardian: (self) Name of Psychiatrist: None Name of Therapist: None  Education Status Is patient currently in school?: No Is the patient employed, unemployed or receiving disability?: Unemployed  Risk to self with the past 6 months Suicidal Ideation: Yes-Currently Present Has patient been a risk to self within the past 6 months prior to admission? : Yes Suicidal Intent: No Has patient had any suicidal intent within the past 6 months prior to admission? : No Is patient at risk for suicide?: Yes Suicidal Plan?: Yes-Currently Present("anyway, jump in front of a car") Has patient had any suicidal plan within the past 6 months prior to admission? : No Specify Current Suicidal Plan: jump in front of  car Access to Means: Yes Specify Access to Suicidal Means: walk into street, jump in front of car What has been your use of drugs/alcohol within the last 12 months?: cocaine crack alcohol Previous Attempts/Gestures: No How many times?:  0 Triggers for Past Attempts: Unknown("life") Intentional Self Injurious Behavior: None Family Suicide History: Unknown Recent stressful life event(s): Financial Problems, Recent negative physical changes, Turmoil (Comment) Persecutory voices/beliefs?: (unknown) Depression: Yes Depression Symptoms: (unknown) Substance abuse history and/or treatment for substance abuse?: Yes([facility])  Risk to Others within the past 6 months Homicidal Ideation: No Does patient have any lifetime risk of violence toward others beyond the six months prior to admission? : No Thoughts of Harm to Others: No Current Homicidal Intent: No Current Homicidal Plan: No Access to Homicidal Means: No Identified Victim: (n/a) History of harm to others?: No Assessment of Violence: None Noted Violent Behavior Description: (n/a) Does patient have access to weapons?: (unknown) Criminal Charges Pending?: (unknown) Does patient have a court date: (unknown) Court Date: (unknown) Is patient on probation?: Unknown  Psychosis Hallucinations: (denied) Delusions: Unspecified  Mental Status Report Appearance/Hygiene: Disheveled, Bizarre Eye Contact: Poor Motor Activity: Restlessness, Rigidity, Shuffling Speech: Rapid, Incoherent Level of Consciousness: Quiet/awake, Drowsy Mood: Anxious, Sad, Helpless, Depressed Affect: Anxious, Depressed, Sad Anxiety Level: Severe Thought Processes: Relevant Judgement: Impaired Orientation: Unable to assess Obsessive Compulsive Thoughts/Behaviors: Unable to Assess  Cognitive Functioning Concentration: Unable to Assess Memory: Unable to Assess Is patient IDD: No Is patient DD?: No Insight: Unable to Assess Impulse Control: Unable to Assess Appetite: (unable to assess) Have you had any weight changes? : (unable to assess) Sleep: Unable to Assess Total Hours of Sleep: (unable to assess) Vegetative Symptoms: Unable to Assess  ADLScreening [facility]  Assessment Services) Patient's cognitive ability adequate to safely complete daily activities?: (unable to assess) Patient able to express need for assistance with ADLs?: Yes Independently performs ADLs?: Yes (appropriate for developmental age)  Prior Inpatient Therapy Prior Inpatient Therapy: Yes Prior Therapy Dates: multiple Prior [facility] Reason for Treatment: schizophrenia  Prior Outpatient Therapy Prior Outpatient Therapy: Yes([facility] Prior Therapy Dates: (unknown) Prior Therapy Facilty/Provider(s): (unknown) Reason for Treatment: (unknown) Does patient have an ACCT team?: Unknown Does patient have Intensive In-House Services?  : No Does patient have [name] services? : Unknown Does patient have [facility] services?: Unknown  ADL Screening (condition at time of admission) Patient's cognitive ability adequate to safely complete daily activities?: (unable to assess) Patient able to express need for assistance with ADLs?: Yes Independently performs ADLs?: Yes (appropriate for developmental age)             Advance Directives (For Healthcare) Does Patient Have a Medical Advance Directive?: No          Disposition:  Disposition Initial Assessment Completed for this Encounter: Yes  Donell SievertSpencer Simon, PA, observation for safety. Reevaluation in a.m by psychiatry and discharge if no safety concerns. Dr. Doug SouMulpus and RN notified of disposition.  On Site Evaluation by: Al CorpusLatisha Matai Carpenito, [facility] Reviewed with Physician: Donell SievertSpencer Simon, PA  Burnetta SabinLatisha D Darrly Loberg 01/26/2018 6:24 AM

## 2018-01-26 NOTE — ED Provider Notes (Signed)
[facility]-EMERGENCY DEPT Provider Note: [name], MD, FACEP  CSN: [id number] MRN: [id number] ARRIVAL: 01/26/18 at 0249 ROOM: [facility]   CHIEF COMPLAINT  Suicidal   HISTORY OF PRESENT ILLNESS  01/26/18 3:23 AM [name] is a 57 y.o. male with history of polysubstance abuse and homelessness with frequent visits to the ED for suicidal ideation.  He is here stating he is contemplating suicide because of multiple stressors in his life.  He is considering "anyway" of doing this but mentions jumping in front of a car.  He admits to using cocaine and alcohol earlier.  He is having some facial pain but no chest pain or abdominal pain.  He admits to not having his psychiatric medication in over a week.  His principal complaint however is that he is hungry.   Past Medical History:  Diagnosis Date  . CHF (congestive heart failure) (HCC)   . Chronic foot pain   . Cocaine abuse (HCC)   . Diabetes mellitus without complication (HCC)   . Hepatitis C   . Homelessness   . Hypertension   . Neuropathy   . Polysubstance abuse (HCC)   . Schizophrenia (HCC)     History reviewed. No pertinent surgical history.  Family History  Problem Relation Age of Onset  . Hypertension Other   . Diabetes Other     Social History   Tobacco Use  . Smoking status: Current Every Day Smoker    Packs/day: 1.00    Years: 20.00    Pack years: 20.00    Types: Cigarettes  . Smokeless tobacco: Current User  Substance Use Topics  . Alcohol use: Yes    Comment: Daily Drinker   . Drug use: Yes    Frequency: 7.0 times per week    Types: "Crack" cocaine, Cocaine, Marijuana    Comment: Cocaine tonight, Marijuana "a long time"    Prior to Admission medications   Medication Sig Start Date End Date Taking? Authorizing Provider  acetaminophen (TYLENOL) 500 MG tablet Take 1 tablet (500 mg total) by mouth every 6 (six) hours as needed. Patient not taking: Reported on 12/10/2017 12/07/17   McDonald, Mia A,  PA-C  Cetirizine HCl (ZYRTEC ALLERGY) 10 MG CAPS Take 1 capsule (10 mg total) by mouth daily. Patient not taking: Reported on 11/23/2017 10/03/17   Ward, Layla Maw, DO  clotrimazole (LOTRIMIN) 1 % cream Apply to affected area 2 times daily Patient not taking: Reported on 01/12/2018 12/17/17   Roxy Horseman, PA-C  fluticasone [facility]) 50 MCG/ACT nasal spray Place 2 sprays into both nostrils daily. Patient not taking: Reported on 11/23/2017 10/03/17   Ward, Layla Maw, DO  hydrochlorothiazide (HYDRODIURIL) 25 MG tablet Take 1 tablet (25 mg total) by mouth daily. Patient not taking: Reported on 11/23/2017 10/13/17   Ward, Layla Maw, DO  lisinopril (PRINIVIL,ZESTRIL) 40 MG tablet Take 1 tablet (40 mg total) by mouth daily. Patient not taking: Reported on 11/23/2017 10/13/17   Ward, Layla Maw, DO    Allergies Haldol [haloperidol]   REVIEW OF SYSTEMS  Negative except as noted here or in the History of Present Illness.   PHYSICAL EXAMINATION  Initial Vital Signs Blood pressure (!) 162/100, pulse (!) 103, temperature 98.9 F (37.2 C), temperature source Oral, resp. rate 16, height 5\' 9"  (1.753 m), weight 77.1 kg (170 lb), SpO2 99 %.  Examination General: Well-developed, well-nourished male in no acute distress; appearance consistent with age of record HENT: normocephalic; atraumatic Eyes: pupils equal, round and reactive  to light; extraocular muscles intact; arcus senilis bilaterally Neck: supple Heart: regular rate and rhythm Lungs: clear to auscultation bilaterally Abdomen: soft; nondistended; nontender; no masses or hepatosplenomegaly; bowel sounds present Extremities: No deformity; full range of motion; pulses normal Neurologic: Awake, alert; motor function intact in all extremities and symmetric; no facial droop Skin: Warm and dry Psychiatric: Suicidal ideation   RESULTS  Summary of this visit's results, reviewed by myself:   EKG Interpretation  Date/Time:    Ventricular Rate:      PR Interval:    QRS Duration:   QT Interval:    QTC Calculation:   R Axis:     Text Interpretation:        Laboratory Studies: Results for orders placed or performed during the hospital encounter of 01/26/18 (from the past 24 hour(s))  Comprehensive metabolic panel     Status: Abnormal   Collection Time: 01/26/18  3:40 AM  Result Value Ref Range   Sodium 143 135 - 145 mmol/L   Potassium 3.3 (L) 3.5 - 5.1 mmol/L   Chloride 110 98 - 111 mmol/L   CO2 26 22 - 32 mmol/L   Glucose, Bld 148 (H) 70 - 99 mg/dL   BUN 6 6 - 20 mg/dL   Creatinine, Ser 1.610.86 0.61 - 1.24 mg/dL   Calcium 9.0 8.9 - 09.610.3 mg/dL   Total Protein 6.7 6.5 - 8.1 g/dL   Albumin 3.6 3.5 - 5.0 g/dL   AST 22 15 - 41 U/L   ALT 15 0 - 44 U/L   Alkaline Phosphatase 78 38 - 126 U/L   Total Bilirubin 0.9 0.3 - 1.2 mg/dL   GFR calc non Af Amer >60 >60 mL/min   GFR calc Af Amer >60 >60 mL/min   Anion gap 7 5 - 15  Ethanol     Status: None   Collection Time: 01/26/18  3:40 AM  Result Value Ref Range   Alcohol, Ethyl (B) <10 <10 mg/dL  Salicylate level     Status: None   Collection Time: 01/26/18  3:40 AM  Result Value Ref Range   Salicylate Lvl <7.0 2.8 - 30.0 mg/dL  Acetaminophen level     Status: Abnormal   Collection Time: 01/26/18  3:40 AM  Result Value Ref Range   Acetaminophen (Tylenol), Serum <10 (L) 10 - 30 ug/mL  cbc     Status: Abnormal   Collection Time: 01/26/18  3:40 AM  Result Value Ref Range   WBC 8.2 4.0 - 10.5 K/uL   RBC 3.93 (L) 4.22 - 5.81 MIL/uL   Hemoglobin 11.4 (L) 13.0 - 17.0 g/dL   HCT 04.535.3 (L) 40.939.0 - 81.152.0 %   MCV 89.8 78.0 - 100.0 fL   MCH 29.0 26.0 - 34.0 pg   MCHC 32.3 30.0 - 36.0 g/dL   RDW 91.413.4 78.211.5 - 95.615.5 %   Platelets 203 150 - 400 K/uL  Rapid urine drug screen (hospital performed)     Status: Abnormal   Collection Time: 01/26/18  3:41 AM  Result Value Ref Range   Opiates NONE DETECTED NONE DETECTED   Cocaine POSITIVE (A) NONE DETECTED   Benzodiazepines NONE DETECTED NONE  DETECTED   Amphetamines NONE DETECTED NONE DETECTED   Tetrahydrocannabinol NONE DETECTED NONE DETECTED   Barbiturates NONE DETECTED NONE DETECTED   Imaging Studies: No results found.  ED COURSE and MDM  Nursing notes and initial vitals signs, including pulse oximetry, reviewed.  Vitals:   01/26/18 [id number] 01/26/18 [id number]  BP: (!) 162/100   Pulse: (!) 103   Resp: 16   Temp: 98.9 F (37.2 C)   TempSrc: Oral   SpO2: 99%   Weight:  77.1 kg (170 lb)  Height:  5\' 9"  (1.753 m)    PROCEDURES    ED DIAGNOSES     ICD-10-CM   1. Suicidal ideation R45.851   2. Cocaine abuse (HCC) F14.10        Jaydon Soroka, Jonny Ruiz, MD 01/26/18 [id number]

## 2018-01-26 NOTE — ED Notes (Signed)
[facility] Expected date:  Expected time:  Means of arrival:  Comments: 

## 2018-01-26 NOTE — ED Notes (Signed)
TTS at bedside to assess.  

## 2018-01-26 NOTE — ED Notes (Signed)
Pt provided with breakfast tray.

## 2018-01-26 NOTE — ED Notes (Signed)
Pt provided with belongings. Pt is getting dressed at this time. Pt will be escorted out once dressed

## 2018-01-26 NOTE — BH Assessment (Addendum)
[facility]  Per Juanetta BeetsJacqueline Norman, DO, this pt does not require psychiatric hospitalization at this time.  [facility].  This has been included in pt's discharge instructions.  Pt would also benefit from seeing Peer Support Specialists; they will be asked to speak to pt.  Pt's nurse has been notified.  Doylene Canninghomas Cressie Betzler, MA Triage Specialist [phone]

## 2018-01-26 NOTE — [facility] Suicide Risk Assessment (Signed)
Suicide Risk Assessment  Discharge Assessment   [facility]   Principal Problem: Cocaine abuse with cocaine-induced mood disorder [facility](HCC) Discharge Diagnoses:  Patient Active Problem List   Diagnosis Date Noted  . Cocaine abuse with cocaine-induced mood disorder [facility](HCC) [F14.14] 09/18/2017    Priority: High  . Schizophrenia, paranoid type (HCC) [F20.0] 01/17/2015    Priority: High  . Drug hallucinosis [facility](HCC) [F19.951] 10/08/2014    Priority: High  . Chronic paranoid schizophrenia (HCC) [F20.0] 09/07/2014    Priority: High  . Cocaine use disorder, severe, dependence (HCC) [F14.20]     Priority: High  . Cocaine abuse (HCC) [F14.10] 03/10/2014    Priority: High  . Chronic foot pain [M79.673, G89.29]   . Schizoaffective disorder, bipolar type (HCC) [F25.0] 09/30/2016  . Suicidal thoughts [R45.851]   . Substance induced mood disorder (HCC) [F19.94] 03/13/2015  . Acute kidney failure (HCC) [N17.9] 01/26/2015  . Suicidal ideation [R45.851]   . Substance or medication-induced bipolar and related disorder with onset during intoxication (HCC) [Q46.962[F19.929, F19.94] 08/10/2014  . Acute CHF (congestive heart failure) (HCC) [I50.9] 07/29/2014  . Essential hypertension, benign [I10] 03/28/2013  . Diabetes mellitus (HCC) [E11.9] 03/15/2013    Total Time spent with patient: 45 minutes  Musculoskeletal: Strength & Muscle Tone: within normal limits Gait & Station: normal Patient leans: N/A  Psychiatric Specialty Exam:   Blood pressure (!) 162/100, pulse (!) 103, temperature 98.9 F (37.2 C), temperature source Oral, resp. rate 16, height 5\' 9"  (1.753 m), weight 77.1 kg (170 lb), SpO2 99 %.Body mass index is 25.1 kg/m.  General Appearance: Casual  Eye Contact::  Good  Speech:  Normal Rate409  Volume:  Normal  Mood:  Euthymic  Affect:  Congruent  Thought Process:  Coherent and Descriptions of Associations: Intact  Orientation:  Full (Time, Place, and Person)  Thought  Content:  WDL and Logical  Suicidal Thoughts:  No  Homicidal Thoughts:  No  Memory:  Immediate;   Good Recent;   Good Remote;   Good  Judgement:  Fair  Insight:  Fair  Psychomotor Activity:  Normal  Concentration:  Good  Recall:  Good  Fund of Knowledge:Fair  Language: Good  Akathisia:  No  Handed:  Right  AIMS (if indicated):     Assets:  Leisure Time Physical Health Resilience Social Support  Sleep:     Cognition: WNL  ADL's:  Intact   Mental Status Per Nursing Assessment::   On Admission:   57 yo male who presented to the ED after using cocaine and having suicidal ideations.  Well known to this ED for frequent similar presentation.  He slept and at a hearty breakfast.  No suicidal/homicidal ideations, hallucinations, or withdrawal symptoms at this time.  Stable for discharge.  Demographic Factors:  Male  Loss Factors: Legal issues  Historical Factors: NA  Risk Reduction Factors:   Sense of responsibility to family, Positive social support and Positive therapeutic relationship  Continued Clinical Symptoms:  None  Cognitive Features That Contribute To Risk:  None    Suicide Risk:  Minimal: No identifiable suicidal ideation.  Patients presenting with no risk factors but with morbid ruminations; may be classified as minimal risk based on the severity of the depressive symptoms    Plan Of Care/Follow-up recommendations:  Activity:  as tolerated Diet:  heart healthy diet  LORD, JAMISON, NP 01/26/2018, 11:25 AM

## 2018-02-24 ENCOUNTER — Emergency Department (HOSPITAL_COMMUNITY)
Admission: EM | Admit: 2018-02-24 | Discharge: 2018-02-24 | Disposition: A | Discharge status: Home / Self Care | Payer: Medicaid Other | Attending: Emergency Medicine | Admitting: Emergency Medicine

## 2018-02-24 ENCOUNTER — Encounter (HOSPITAL_COMMUNITY): Payer: Self-pay | Admitting: Emergency Medicine

## 2018-02-24 ENCOUNTER — Emergency Department (HOSPITAL_COMMUNITY): Payer: Medicaid Other

## 2018-02-24 DIAGNOSIS — I110 Hypertensive heart disease with heart failure: Secondary | ICD-10-CM | POA: Diagnosis not present

## 2018-02-24 DIAGNOSIS — I509 Heart failure, unspecified: Secondary | ICD-10-CM | POA: Insufficient documentation

## 2018-02-24 DIAGNOSIS — E119 Type 2 diabetes mellitus without complications: Secondary | ICD-10-CM | POA: Diagnosis not present

## 2018-02-24 DIAGNOSIS — R1013 Epigastric pain: Secondary | ICD-10-CM | POA: Insufficient documentation

## 2018-02-24 DIAGNOSIS — F17210 Nicotine dependence, cigarettes, uncomplicated: Secondary | ICD-10-CM | POA: Diagnosis not present

## 2018-02-24 DIAGNOSIS — Z590 Homelessness: Secondary | ICD-10-CM | POA: Diagnosis not present

## 2018-02-24 DIAGNOSIS — D696 Thrombocytopenia, unspecified: Secondary | ICD-10-CM

## 2018-02-24 LAB — CBC WITH DIFFERENTIAL/PLATELET
Abs Immature Granulocytes: 0 10*3/uL (ref 0.0–0.1)
BASOS ABS: 0 10*3/uL (ref 0.0–0.1)
Basophils Relative: 0 %
EOS PCT: 1 %
Eosinophils Absolute: 0.1 10*3/uL (ref 0.0–0.7)
HCT: 37.6 % — ABNORMAL LOW (ref 39.0–52.0)
HEMOGLOBIN: 11.5 g/dL — AB (ref 13.0–17.0)
Immature Granulocytes: 0 %
LYMPHS PCT: 15 %
Lymphs Abs: 1 10*3/uL (ref 0.7–4.0)
MCH: 29 pg (ref 26.0–34.0)
MCHC: 30.6 g/dL (ref 30.0–36.0)
MCV: 94.9 fL (ref 78.0–100.0)
Monocytes Absolute: 0.6 10*3/uL (ref 0.1–1.0)
Monocytes Relative: 9 %
Neutro Abs: 5 10*3/uL (ref 1.7–7.7)
Neutrophils Relative %: 75 %
PLATELETS: 140 10*3/uL — AB (ref 150–400)
RBC: 3.96 MIL/uL — AB (ref 4.22–5.81)
RDW: 13.2 % (ref 11.5–15.5)
WBC: 6.8 10*3/uL (ref 4.0–10.5)

## 2018-02-24 LAB — COMPREHENSIVE METABOLIC PANEL
ALBUMIN: 3.3 g/dL — AB (ref 3.5–5.0)
ALK PHOS: 87 U/L (ref 38–126)
ALT: 12 U/L (ref 0–44)
ANION GAP: 9 (ref 5–15)
AST: 22 U/L (ref 15–41)
BUN: 10 mg/dL (ref 6–20)
CHLORIDE: 109 mmol/L (ref 98–111)
CO2: 26 mmol/L (ref 22–32)
Calcium: 9.1 mg/dL (ref 8.9–10.3)
Creatinine, Ser: 0.86 mg/dL (ref 0.61–1.24)
GFR calc Af Amer: >60 mL/min (ref >60)
GFR calc non Af Amer: >60 mL/min (ref >60)
GLUCOSE: 125 mg/dL — AB (ref 70–99)
POTASSIUM: 3.3 mmol/L — AB (ref 3.5–5.1)
SODIUM: 144 mmol/L (ref 135–145)
Total Bilirubin: 0.7 mg/dL (ref 0.3–1.2)
Total Protein: 6.3 g/dL — ABNORMAL LOW (ref 6.5–8.1)

## 2018-02-24 LAB — LIPASE, BLOOD: Lipase: 31 U/L (ref 11–51)

## 2018-02-24 LAB — TROPONIN I: Troponin I: <0.03 ng/mL (ref <0.03)

## 2018-02-24 NOTE — Discharge Instructions (Addendum)

## 2018-02-24 NOTE — ED Provider Notes (Signed)
[facility] EMERGENCY DEPARTMENT Provider Note   CSN: [id number] Arrival date & time: 02/24/18  1191     History   Chief Complaint Chief Complaint  Patient presents with  . Abdominal Pain    HPI [name] is a 57 y.o. male who  has a past medical history of CHF, Chronic foot pain, Cocaine abuse , Diabetes mellitus without complication, Hepatitis C, Homelessness, Hypertension, Neuropathy, Polysubstance abuse , and Schizophrenia. Patient presents with cc of epigastric abdominal.  Patient is well-known in this emergency department and has 50 visits in the last 6 months.  The patient is a very poor historian and is difficult to get an accurate history however patient states that was regular he smoked 2 g of crack cocaine last night.  At some point he began having sharp epigastric abdominal pain which comes and goes.  He denies chest pain or shortness of breath.  He denies diarrhea.  The patient stooled and urinated on himself prior to my evaluation however this does not seem to be abnormal for him during his ER visits.  He denies any current chest pain, abdominal pain, nausea or vomiting.  The patient is sleeping during evaluation and was somewhat difficult to arouse however when I told the patient I was going to break an ammonia inhalant under his nose he woke up and spoke to me.   HPI  Past Medical History:  Diagnosis Date  . CHF (congestive heart failure) (HCC)   . Chronic foot pain   . Cocaine abuse (HCC)   . Diabetes mellitus without complication (HCC)   . Hepatitis C   . Homelessness   . Hypertension   . Neuropathy   . Polysubstance abuse (HCC)   . Schizophrenia [facility])     Patient Active Problem List   Diagnosis Date Noted  . Cocaine abuse with cocaine-induced mood disorder (HCC) 09/18/2017  . Chronic foot pain   . Schizoaffective disorder, bipolar type (HCC) 09/30/2016  . Suicidal thoughts   . Substance induced mood disorder (HCC) 03/13/2015    . Acute kidney failure (HCC) 01/26/2015  . Schizophrenia, paranoid type (HCC) 01/17/2015  . Suicidal ideation   . Drug hallucinosis (HCC) 10/08/2014  . Chronic paranoid schizophrenia (HCC) 09/07/2014  . 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 Acute CHF (congestive heart failure) (HCC) 07/29/2014  . Cocaine use disorder, severe, dependence (HCC)   . Cocaine abuse (HCC) 03/10/2014  . Essential hypertension, benign 03/28/2013  . Diabetes mellitus (HCC) 03/15/2013    History reviewed. No pertinent surgical history.      Home Medications    Prior to Admission medications   Not on File    Family History Family History  Problem Relation Age of Onset  . Hypertension Other   . Diabetes Other     Social History Social History   Tobacco Use  . Smoking status: Current Every Day Smoker    Packs/day: 1.00    Years: 20.00    Pack years: 20.00    Types: Cigarettes  . Smokeless tobacco: Current User  Substance Use Topics  . Alcohol use: Yes    Comment: Daily Drinker   . Drug use: Yes    Frequency: 7.0 times per week    Types: "Crack" cocaine, Cocaine, Marijuana    Comment: Cocaine tonight, Marijuana "a long time"     Allergies   Haldol [haloperidol]   Review of Systems Review of Systems  Ten  systems reviewed and are negative for acute change, except as noted in the HPI.   Physical Exam Updated Vital Signs BP (!) 159/90   Pulse 74   Temp 98.7 F (37.1 C)   Resp 18   Ht 5\' 9"  (1.753 m)   Wt 77 kg   SpO2 100%   BMI 25.07 kg/m   Physical Exam  Constitutional: He appears well-developed and well-nourished. No distress.  HENT:  Head: Normocephalic and atraumatic.  Eyes: Conjunctivae are normal. No scleral icterus.  Neck: Normal range of motion. Neck supple.  Cardiovascular: Normal rate, regular rhythm and normal heart sounds.  Pulmonary/Chest: Effort normal and breath sounds normal. No  respiratory distress.  Abdominal: Soft. There is no tenderness.  Musculoskeletal: He exhibits no edema.  Neurological: He is alert.  Skin: Skin is warm and dry. He is not diaphoretic.  Psychiatric: His behavior is normal.  Nursing note and vitals reviewed.     ED Treatments / Results  Labs (all labs ordered are listed, but only abnormal results are displayed) Labs Reviewed  CBC WITH DIFFERENTIAL/PLATELET - Abnormal; Notable for the following components:      Result Value   RBC 3.96 (*)    Hemoglobin 11.5 (*)    HCT 37.6 (*)    Platelets 140 (*)    All other components within normal limits  COMPREHENSIVE METABOLIC PANEL - Abnormal; Notable for the following components:   Potassium 3.3 (*)    Glucose, Bld 125 (*)    Total Protein 6.3 (*)    Albumin 3.3 (*)    All other components within normal limits  LIPASE, BLOOD  I-STAT TROPONIN, ED    EKG None  Radiology No results found.  Procedures Procedures (including critical care time)  Medications Ordered in ED Medications - No data to display   Initial Impression / Assessment and Plan / ED Course  I have reviewed the triage vital signs and the nursing notes.  Pertinent labs & imaging results that were available during my care of the patient were reviewed by me and considered in my medical decision making (see chart for details).  Clinical Course as of Feb 24 737  Tue Feb 24, 2018  0735 No acute abnormalities  DG Abd Acute W/Chest [AH]  0736 Unchanged from previous  EKG 12-Lead [AH]  0736 Lipase: 31 [AH]  0736 Hgb at baseline  Hemoglobin(!): 11.5 [AH]  0737 Platelets have decreased and he now has mild thrombocytopenia  Platelets(!): 140 [AH]  0737 Potassium slightly low,  Potassium(!): 3.3 [AH]    Clinical Course User Index [AH] Arthor Captain, PA-C    Patient presents with abdominal pain. Differential diagnosis of epigastric pain includes: Functional or nonulcer dyspepsia , PUD, GERD, Gastritis, (NSAIDs,  alcohol, stress, H. pylori, pernicious anemia), pancreatitis or pancreatic cancer, overeating indigestion (high-fat foods, coffee), drugs (aspirin, antibiotics (eg, macrolides, metronidazole), corticosteroids, digoxin, narcotics, theophylline), gastroparesis, lactose intolerance, malabsorption gastric cancer, parasitic infection, (Giardia, Strongyloides, Ascaris) cholelithiasis, choledocholithiasis, or cholangitis, ACS, pericarditis, pneumonia, abdominal hernia, pregnancy, intestinal ischemia, esophageal rupture, gastric volvulus, hepatitis. I have low suspicions for ACS, AAA.  I have gathered history from both the patient and review of his EMR.  I personally reviewed the PA and lateral chest film and agree with the radiologic interpretation.  No evidence of ideation mediastinal or other structural anomaly.  Patient's EKG is unchanged from previous and shows no signs of acute ischemia.  He is currently resting comfortably in fact sleeping and I doubt any emergent cause  of his pain.  Patient appears appropriate for discharge.  I reviewed the patient with Dr. Patria Maneampos who agrees with plan for discharge at this time.    Final Clinical Impressions(s) / ED Diagnoses   Final diagnoses:  None    ED Discharge Orders    None       Arthor CaptainHarris, Azyria Osmon, PA-C 02/24/18 1627    Azalia Bilisampos, Kevin, MD 02/24/18 [phone]

## 2018-02-24 NOTE — ED Notes (Signed)
Patient verbalizes understanding of discharge instructions. Opportunity for questioning and answers were provided. Pt discharged from ED. 

## 2018-02-24 NOTE — ED Triage Notes (Signed)
BIB [facility] with c/o abdominal pain. Pt reports 1 beer and 2g of crack cocaine consumed over the previous 24hrs.

## 2018-03-06 ENCOUNTER — Other Ambulatory Visit: Payer: Self-pay

## 2018-03-06 ENCOUNTER — Encounter (HOSPITAL_COMMUNITY): Payer: Self-pay | Admitting: Emergency Medicine

## 2018-03-06 ENCOUNTER — Emergency Department (HOSPITAL_COMMUNITY)
Admission: EM | Admit: 2018-03-06 | Discharge: 2018-03-06 | Disposition: A | Discharge status: Home / Self Care | Payer: Medicaid Other | Attending: Emergency Medicine | Admitting: Emergency Medicine

## 2018-03-06 ENCOUNTER — Emergency Department (HOSPITAL_COMMUNITY)
Admission: EM | Admit: 2018-03-06 | Discharge: 2018-03-06 | Disposition: A | Discharge status: Home / Self Care | Payer: Self-pay | Attending: Emergency Medicine | Admitting: Emergency Medicine

## 2018-03-06 DIAGNOSIS — Z590 Homelessness: Secondary | ICD-10-CM | POA: Insufficient documentation

## 2018-03-06 DIAGNOSIS — F209 Schizophrenia, unspecified: Secondary | ICD-10-CM | POA: Insufficient documentation

## 2018-03-06 DIAGNOSIS — M79673 Pain in unspecified foot: Secondary | ICD-10-CM

## 2018-03-06 DIAGNOSIS — M79671 Pain in right foot: Secondary | ICD-10-CM | POA: Insufficient documentation

## 2018-03-06 DIAGNOSIS — E119 Type 2 diabetes mellitus without complications: Secondary | ICD-10-CM | POA: Diagnosis not present

## 2018-03-06 DIAGNOSIS — I509 Heart failure, unspecified: Secondary | ICD-10-CM | POA: Insufficient documentation

## 2018-03-06 DIAGNOSIS — M79672 Pain in left foot: Secondary | ICD-10-CM | POA: Insufficient documentation

## 2018-03-06 DIAGNOSIS — G8929 Other chronic pain: Secondary | ICD-10-CM | POA: Insufficient documentation

## 2018-03-06 DIAGNOSIS — F17210 Nicotine dependence, cigarettes, uncomplicated: Secondary | ICD-10-CM | POA: Insufficient documentation

## 2018-03-06 DIAGNOSIS — I10 Essential (primary) hypertension: Secondary | ICD-10-CM | POA: Diagnosis not present

## 2018-03-06 DIAGNOSIS — F1490 Cocaine use, unspecified, uncomplicated: Secondary | ICD-10-CM | POA: Insufficient documentation

## 2018-03-06 DIAGNOSIS — Z5321 Procedure and treatment not carried out due to patient leaving prior to being seen by health care provider: Secondary | ICD-10-CM | POA: Insufficient documentation

## 2018-03-06 NOTE — ED Notes (Signed)
Social Work at bedside 

## 2018-03-06 NOTE — Discharge Instructions (Signed)
Can take tylenol or motrin for pain. °Follow-up with your primary care doctor. °Return to the ED for new or worsening symptoms. °

## 2018-03-06 NOTE — ED Triage Notes (Signed)
Pt states he wants to see a Child psychotherapist. Discussed with Dr. Estell Harpin, advised that if pt's symptoms are unchanged from the visit today, pt may see social worker. Pt states chronic foot pain and swelling, no change form earlier visit. Requests food and change of clothes.

## 2018-03-06 NOTE — ED Provider Notes (Signed)
[facility]-EMERGENCY DEPT Provider Note   CSN: [id number] Arrival date & time: 03/06/18  0108     History   Chief Complaint Chief Complaint  Patient presents with  . Foot Pain  . Suicidal    HPI [name] is a 57 y.o. male.  The history is provided by the patient and medical records.  Foot Pain      57 year old male with history of chronic foot pain, polysubstance abuse, hepatitis C, diabetes, homelessness, schizophrenia, presenting to the ED for foot pain.  Apparently reported SI to EMS but denies this here.  States his feet are hurting from walking and he needs some work to rest.  Patient well-known to this ED for similar complaints, 51 visits in the past 6 months.  Past Medical History:  Diagnosis Date  . CHF (congestive heart failure) (HCC)   . Chronic foot pain   . Cocaine abuse (HCC)   . Diabetes mellitus without complication (HCC)   . Hepatitis C   . Homelessness   . Hypertension   . Neuropathy   . Polysubstance abuse (HCC)   . Schizophrenia [facility])     Patient Active Problem List   Diagnosis Date Noted  . Cocaine abuse with cocaine-induced mood disorder (HCC) 09/18/2017  . Chronic foot pain   . Schizoaffective disorder, bipolar type (HCC) 09/30/2016  . Suicidal thoughts   . Substance induced mood disorder (HCC) 03/13/2015  . Acute kidney failure (HCC) 01/26/2015  . Schizophrenia, paranoid type (HCC) 01/17/2015  . Suicidal ideation   . Drug hallucinosis (HCC) 10/08/2014  . Chronic paranoid schizophrenia (HCC) 09/07/2014  . 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 Acute CHF (congestive heart failure) (HCC) 07/29/2014  . Cocaine use disorder, severe, dependence (HCC)   . Cocaine abuse (HCC) 03/10/2014  . Essential hypertension, benign 03/28/2013  . Diabetes mellitus (HCC) 03/15/2013    History reviewed. No pertinent surgical history.      Home  Medications    Prior to Admission medications   Not on File    Family History Family History  Problem Relation Age of Onset  . Hypertension Other   . Diabetes Other     Social History Social History   Tobacco Use  . Smoking status: Current Every Day Smoker    Packs/day: 1.00    Years: 20.00    Pack years: 20.00    Types: Cigarettes  . Smokeless tobacco: Current User  Substance Use Topics  . Alcohol use: Yes    Comment: Daily Drinker   . Drug use: Yes    Frequency: 7.0 times per week    Types: "Crack" cocaine, Cocaine, Marijuana    Comment: Cocaine tonight, Marijuana "a long time"     Allergies   Haldol [haloperidol]   Review of Systems Review of Systems  Musculoskeletal: Positive for arthralgias.  All other systems reviewed and are negative.    Physical Exam Updated Vital Signs BP (!) 173/107 (BP Location: Left Arm)   Pulse 93   Temp 98.3 F (36.8 C) (Oral)   Resp 18   SpO2 95%   Physical Exam  Constitutional: He is oriented to person, place, and time. He appears well-developed and well-nourished.  Asleep in chair with blanket over his head  HENT:  Head: Normocephalic and atraumatic.  Mouth/Throat: Oropharynx is clear and moist.  Eyes: Pupils are equal, round, and reactive to light. Conjunctivae and EOM are normal.  Neck: Normal range  of motion.  Cardiovascular: Normal rate, regular rhythm and normal heart sounds.  Pulmonary/Chest: Effort normal and breath sounds normal.  Abdominal: Soft. Bowel sounds are normal.  Musculoskeletal: Normal range of motion.  Both feet with calluses, however no open wounds or sores; no signs of cellulitis  Neurological: He is alert and oriented to person, place, and time.  Skin: Skin is warm and dry.  Psychiatric: He has a normal mood and affect. He is not actively hallucinating. He expresses no homicidal and no suicidal ideation. He expresses no suicidal plans and no homicidal plans.  Nursing note and vitals  reviewed.    ED Treatments / Results  Labs (all labs ordered are listed, but only abnormal results are displayed) Labs Reviewed - No data to display  EKG None  Radiology No results found.  Procedures Procedures (including critical care time)  Medications Ordered in ED Medications - No data to display   Initial Impression / Assessment and Plan / ED Course  I have reviewed the triage vital signs and the nursing notes.  Pertinent labs & imaging results that were available during my care of the patient were reviewed by me and considered in my medical decision making (see chart for details).  57 year old male well-known to the ED with 51 visits in the past 6 months presenting to the ED with bilateral foot pain.  This is a chronic issue for him.  Apparently reported SI to EMS but denies that here.  Afebrile, nontoxic.  Feet do have calluses but no open wounds or signs of infection on exam here.  He remains ambulatory.  Patient was allowed to rest here, remains without acute changes, no voiced SI/HI/AVH.  Appears stable for discharge.  Will have him follow-up with his primary care doctor.  Return here for any new/acute changes.  Final Clinical Impressions(s) / ED Diagnoses   Final diagnoses:  Chronic foot pain, unspecified laterality    ED Discharge Orders    None       Garlon Hatchet, PA-C 03/06/18 0544    Derwood Kaplan, MD 03/06/18 [phone]

## 2018-03-06 NOTE — ED Notes (Signed)
[facility] Expected date:  Expected time:  Means of arrival:  Comments: 

## 2018-03-06 NOTE — ED Notes (Signed)
Pt leaving prior to DC paperwork being provided and prior to pt receiving bus pass from SW. When asked why pt wasn't waiting on bus pass or DC paperwork, pt states " I am just ready to go. I already have a pass." Pt provided with 2 blankets. Pt asked this RN to throw out soiled pants. Pt insisted that they were ruined and he wasn't able to wash them. Pt took clothing that was provided to him from hospital staff. Pt ambulated out without difficulty.

## 2018-03-06 NOTE — ED Notes (Signed)
Bed: WTR6 Expected date:  Expected time:  Means of arrival:  Comments: 

## 2018-03-06 NOTE — ED Triage Notes (Signed)
Pt arriving via [facility] for bilateral foot pain/swelling and SI.

## 2018-03-06 NOTE — ED Notes (Addendum)
Pt is requesting to leave at this time. Zammit MD made aware. Pt safe for DC at this time.  Pt states " I only wanted to see the social worker" Pt was provided with 2 pairs of clothing , breakfast tray and provided with bus pass from SW

## 2018-03-06 NOTE — Progress Notes (Signed)
CSW spoke with patient at bedside. Patient requested that CSW reach out to his payee, Babette Relic [phone], to see if she could meet patient somewhere to help him get a room for the night. CSW reached out to number provided by patient. CSW left voicemail for return call. CSW informed patient that she left payee a voicemail to which he stated, "that's ok I'm going to leave". Patient then requested that CSW find him a jacket because he is diabetic. CSW stated she would try to find a jacket but if she could not she would provide patient with a list of clothing resources. CSW informed by RN that patient was provided with 2 pairs of clothes.   CSW was going to provide patient with resources and bus pass but was informed patient left.   Archie Balboa, LCSWA  Clinical Social Work Department  [employer]  [phone]

## 2018-03-16 ENCOUNTER — Observation Stay (HOSPITAL_COMMUNITY)
Admission: EM | Admit: 2018-03-16 | Discharge: 2018-03-16 | Discharge status: Against Medical Advice | Payer: Medicaid Other | Attending: Student in an Organized Health Care Education/Training Program | Admitting: Student in an Organized Health Care Education/Training Program

## 2018-03-16 ENCOUNTER — Encounter (HOSPITAL_COMMUNITY): Payer: Self-pay | Admitting: Emergency Medicine

## 2018-03-16 ENCOUNTER — Observation Stay (HOSPITAL_BASED_OUTPATIENT_CLINIC_OR_DEPARTMENT_OTHER): Payer: Medicaid Other

## 2018-03-16 ENCOUNTER — Emergency Department (HOSPITAL_COMMUNITY): Payer: Medicaid Other

## 2018-03-16 DIAGNOSIS — E1140 Type 2 diabetes mellitus with diabetic neuropathy, unspecified: Secondary | ICD-10-CM | POA: Diagnosis not present

## 2018-03-16 DIAGNOSIS — I351 Nonrheumatic aortic (valve) insufficiency: Secondary | ICD-10-CM

## 2018-03-16 DIAGNOSIS — R079 Chest pain, unspecified: Principal | ICD-10-CM | POA: Insufficient documentation

## 2018-03-16 DIAGNOSIS — R45851 Suicidal ideations: Secondary | ICD-10-CM | POA: Diagnosis not present

## 2018-03-16 DIAGNOSIS — I10 Essential (primary) hypertension: Secondary | ICD-10-CM | POA: Diagnosis present

## 2018-03-16 DIAGNOSIS — J9601 Acute respiratory failure with hypoxia: Secondary | ICD-10-CM | POA: Diagnosis not present

## 2018-03-16 DIAGNOSIS — E119 Type 2 diabetes mellitus without complications: Secondary | ICD-10-CM

## 2018-03-16 DIAGNOSIS — I700 Atherosclerosis of aorta: Secondary | ICD-10-CM

## 2018-03-16 DIAGNOSIS — F1010 Alcohol abuse, uncomplicated: Secondary | ICD-10-CM | POA: Insufficient documentation

## 2018-03-16 DIAGNOSIS — Z888 Allergy status to other drugs, medicaments and biological substances status: Secondary | ICD-10-CM | POA: Diagnosis not present

## 2018-03-16 DIAGNOSIS — Z590 Homelessness: Secondary | ICD-10-CM | POA: Diagnosis not present

## 2018-03-16 DIAGNOSIS — F1410 Cocaine abuse, uncomplicated: Secondary | ICD-10-CM | POA: Diagnosis not present

## 2018-03-16 DIAGNOSIS — Z794 Long term (current) use of insulin: Secondary | ICD-10-CM

## 2018-03-16 DIAGNOSIS — I340 Nonrheumatic mitral (valve) insufficiency: Secondary | ICD-10-CM

## 2018-03-16 DIAGNOSIS — I2510 Atherosclerotic heart disease of native coronary artery without angina pectoris: Secondary | ICD-10-CM | POA: Insufficient documentation

## 2018-03-16 DIAGNOSIS — I420 Dilated cardiomyopathy: Secondary | ICD-10-CM | POA: Diagnosis not present

## 2018-03-16 DIAGNOSIS — E876 Hypokalemia: Secondary | ICD-10-CM | POA: Insufficient documentation

## 2018-03-16 DIAGNOSIS — F17210 Nicotine dependence, cigarettes, uncomplicated: Secondary | ICD-10-CM | POA: Diagnosis not present

## 2018-03-16 DIAGNOSIS — B1920 Unspecified viral hepatitis C without hepatic coma: Secondary | ICD-10-CM | POA: Insufficient documentation

## 2018-03-16 DIAGNOSIS — F1210 Cannabis abuse, uncomplicated: Secondary | ICD-10-CM | POA: Insufficient documentation

## 2018-03-16 DIAGNOSIS — F250 Schizoaffective disorder, bipolar type: Secondary | ICD-10-CM | POA: Diagnosis not present

## 2018-03-16 DIAGNOSIS — I714 Abdominal aortic aneurysm, without rupture, unspecified: Secondary | ICD-10-CM | POA: Diagnosis present

## 2018-03-16 DIAGNOSIS — I080 Rheumatic disorders of both mitral and aortic valves: Secondary | ICD-10-CM | POA: Diagnosis not present

## 2018-03-16 DIAGNOSIS — N400 Enlarged prostate without lower urinary tract symptoms: Secondary | ICD-10-CM | POA: Insufficient documentation

## 2018-03-16 DIAGNOSIS — I110 Hypertensive heart disease with heart failure: Secondary | ICD-10-CM | POA: Diagnosis not present

## 2018-03-16 DIAGNOSIS — F1910 Other psychoactive substance abuse, uncomplicated: Secondary | ICD-10-CM

## 2018-03-16 DIAGNOSIS — J811 Chronic pulmonary edema: Secondary | ICD-10-CM | POA: Diagnosis not present

## 2018-03-16 DIAGNOSIS — I5032 Chronic diastolic (congestive) heart failure: Secondary | ICD-10-CM | POA: Insufficient documentation

## 2018-03-16 DIAGNOSIS — F1414 Cocaine abuse with cocaine-induced mood disorder: Secondary | ICD-10-CM | POA: Diagnosis present

## 2018-03-16 DIAGNOSIS — I509 Heart failure, unspecified: Secondary | ICD-10-CM

## 2018-03-16 DIAGNOSIS — R0902 Hypoxemia: Secondary | ICD-10-CM

## 2018-03-16 LAB — COMPREHENSIVE METABOLIC PANEL
ALT: 22 U/L (ref 0–44)
ANION GAP: 9 (ref 5–15)
AST: 22 U/L (ref 15–41)
Albumin: 2.7 g/dL — ABNORMAL LOW (ref 3.5–5.0)
Alkaline Phosphatase: 80 U/L (ref 38–126)
BILIRUBIN TOTAL: 1 mg/dL (ref 0.3–1.2)
BUN: 9 mg/dL (ref 6–20)
CALCIUM: 8.6 mg/dL — AB (ref 8.9–10.3)
CO2: 27 mmol/L (ref 22–32)
Chloride: 108 mmol/L (ref 98–111)
Creatinine, Ser: 0.91 mg/dL (ref 0.61–1.24)
GFR calc Af Amer: >60 mL/min (ref >60)
Glucose, Bld: 128 mg/dL — ABNORMAL HIGH (ref 70–99)
POTASSIUM: 3.1 mmol/L — AB (ref 3.5–5.1)
Sodium: 144 mmol/L (ref 135–145)
Total Protein: 5.2 g/dL — ABNORMAL LOW (ref 6.5–8.1)

## 2018-03-16 LAB — RAPID URINE DRUG SCREEN, HOSP PERFORMED
Amphetamines: NOT DETECTED
Barbiturates: NOT DETECTED
Benzodiazepines: NOT DETECTED
COCAINE: POSITIVE — AB
OPIATES: NOT DETECTED
TETRAHYDROCANNABINOL: NOT DETECTED

## 2018-03-16 LAB — I-STAT TROPONIN, ED: Troponin i, poc: 0.02 ng/mL (ref 0.00–0.08)

## 2018-03-16 LAB — CBC WITH DIFFERENTIAL/PLATELET
Abs Immature Granulocytes: 0 10*3/uL (ref 0.0–0.1)
BASOS PCT: 1 %
Basophils Absolute: 0 10*3/uL (ref 0.0–0.1)
EOS PCT: 3 %
Eosinophils Absolute: 0.1 10*3/uL (ref 0.0–0.7)
HEMATOCRIT: 35.9 % — AB (ref 39.0–52.0)
Hemoglobin: 10.9 g/dL — ABNORMAL LOW (ref 13.0–17.0)
Immature Granulocytes: 0 %
LYMPHS ABS: 0.8 10*3/uL (ref 0.7–4.0)
Lymphocytes Relative: 15 %
MCH: 28.8 pg (ref 26.0–34.0)
MCHC: 30.4 g/dL (ref 30.0–36.0)
MCV: 95 fL (ref 78.0–100.0)
Monocytes Absolute: 0.5 10*3/uL (ref 0.1–1.0)
Monocytes Relative: 8 %
Neutro Abs: 4.2 10*3/uL (ref 1.7–7.7)
Neutrophils Relative %: 73 %
Platelets: 237 10*3/uL (ref 150–400)
RBC: 3.78 MIL/uL — AB (ref 4.22–5.81)
RDW: 14.1 % (ref 11.5–15.5)
WBC: 5.7 10*3/uL (ref 4.0–10.5)

## 2018-03-16 LAB — PHOSPHORUS: PHOSPHORUS: 3.3 mg/dL (ref 2.5–4.6)

## 2018-03-16 LAB — ECHOCARDIOGRAM COMPLETE
HEIGHTINCHES: 69 in
WEIGHTICAEL: 2574.4 [oz_av]

## 2018-03-16 LAB — BRAIN NATRIURETIC PEPTIDE: B Natriuretic Peptide: 351.5 pg/mL — ABNORMAL HIGH (ref 0.0–100.0)

## 2018-03-16 LAB — TROPONIN I: Troponin I: <0.03 ng/mL (ref <0.03)

## 2018-03-16 LAB — GLUCOSE, CAPILLARY: Glucose-Capillary: 98 mg/dL (ref 70–99)

## 2018-03-16 LAB — MAGNESIUM: Magnesium: 2.1 mg/dL (ref 1.7–2.4)

## 2018-03-16 MED ORDER — INSULIN ASPART 100 UNIT/ML ~~LOC~~ SOLN: 0.0000 [IU] | Freq: Three times a day (TID) | SUBCUTANEOUS | Status: DC

## 2018-03-16 MED ORDER — LORAZEPAM 2 MG/ML IJ SOLN: 0.0000 mg | Freq: Two times a day (BID) | INTRAMUSCULAR | Status: DC

## 2018-03-16 MED ORDER — ASPIRIN 81 MG PO CHEW
324.0000 mg | CHEWABLE_TABLET | Freq: Once | ORAL | Status: AC
  Administered 2018-03-16: 324 mg via ORAL
  Filled 2018-03-16: qty 4

## 2018-03-16 MED ORDER — ONDANSETRON HCL 4 MG/2ML IJ SOLN: 4.0000 mg | Freq: Four times a day (QID) | INTRAMUSCULAR | Status: DC | PRN

## 2018-03-16 MED ORDER — ONDANSETRON HCL 4 MG PO TABS: 4.0000 mg | ORAL_TABLET | Freq: Four times a day (QID) | ORAL | Status: DC | PRN

## 2018-03-16 MED ORDER — ENOXAPARIN SODIUM 40 MG/0.4ML ~~LOC~~ SOLN
40.0000 mg | SUBCUTANEOUS | Status: DC
  Administered 2018-03-16: 40 mg via SUBCUTANEOUS
  Filled 2018-03-16 (×2): qty 0.4

## 2018-03-16 MED ORDER — FUROSEMIDE 10 MG/ML IJ SOLN
40.0000 mg | Freq: Once | INTRAMUSCULAR | Status: AC
  Administered 2018-03-16: 40 mg via INTRAVENOUS
  Filled 2018-03-16: qty 4

## 2018-03-16 MED ORDER — ASPIRIN EC 81 MG PO TBEC: 81.0000 mg | DELAYED_RELEASE_TABLET | Freq: Every day | ORAL | Status: DC

## 2018-03-16 MED ORDER — VITAMIN B-1 100 MG PO TABS: 100.0000 mg | ORAL_TABLET | Freq: Every day | ORAL | Status: DC

## 2018-03-16 MED ORDER — THIAMINE HCL 100 MG/ML IJ SOLN: 100.0000 mg | Freq: Every day | INTRAMUSCULAR | Status: DC

## 2018-03-16 MED ORDER — NICOTINE 14 MG/24HR TD PT24: 14.0000 mg | MEDICATED_PATCH | Freq: Every day | TRANSDERMAL | Status: DC | PRN

## 2018-03-16 MED ORDER — ACETAMINOPHEN 325 MG PO TABS
650.0000 mg | ORAL_TABLET | Freq: Four times a day (QID) | ORAL | Status: DC | PRN
  Administered 2018-03-16: 650 mg via ORAL
  Filled 2018-03-16: qty 2

## 2018-03-16 MED ORDER — ACETAMINOPHEN 650 MG RE SUPP: 650.0000 mg | Freq: Four times a day (QID) | RECTAL | Status: DC | PRN

## 2018-03-16 MED ORDER — SENNOSIDES-DOCUSATE SODIUM 8.6-50 MG PO TABS: 1.0000 | ORAL_TABLET | Freq: Every evening | ORAL | Status: DC | PRN

## 2018-03-16 MED ORDER — LORAZEPAM 1 MG PO TABS: 0.0000 mg | ORAL_TABLET | Freq: Four times a day (QID) | ORAL | Status: DC

## 2018-03-16 MED ORDER — LORAZEPAM 1 MG PO TABS: 0.0000 mg | ORAL_TABLET | Freq: Two times a day (BID) | ORAL | Status: DC

## 2018-03-16 MED ORDER — POTASSIUM CHLORIDE CRYS ER 20 MEQ PO TBCR
40.0000 meq | EXTENDED_RELEASE_TABLET | Freq: Once | ORAL | Status: AC
  Administered 2018-03-16: 40 meq via ORAL
  Filled 2018-03-16: qty 2

## 2018-03-16 MED ORDER — LORAZEPAM 1 MG PO TABS: 1.0000 mg | ORAL_TABLET | Freq: Four times a day (QID) | ORAL | Status: DC | PRN

## 2018-03-16 MED ORDER — FOLIC ACID 1 MG PO TABS
1.0000 mg | ORAL_TABLET | Freq: Every day | ORAL | Status: DC
  Administered 2018-03-16: 1 mg via ORAL
  Filled 2018-03-16: qty 1

## 2018-03-16 MED ORDER — ADULT MULTIVITAMIN W/MINERALS CH
1.0000 | ORAL_TABLET | Freq: Every day | ORAL | Status: DC
  Administered 2018-03-16: 1 via ORAL
  Filled 2018-03-16: qty 1

## 2018-03-16 MED ORDER — LORAZEPAM 2 MG/ML IJ SOLN: 0.0000 mg | Freq: Four times a day (QID) | INTRAMUSCULAR | Status: DC

## 2018-03-16 MED ORDER — VITAMIN B-1 100 MG PO TABS
100.0000 mg | ORAL_TABLET | Freq: Every day | ORAL | Status: DC
  Administered 2018-03-16: 100 mg via ORAL
  Filled 2018-03-16: qty 1

## 2018-03-16 MED ORDER — SODIUM CHLORIDE 0.9% FLUSH: 3.0000 mL | Freq: Two times a day (BID) | INTRAVENOUS | Status: DC

## 2018-03-16 MED ORDER — LORAZEPAM 2 MG/ML IJ SOLN: 1.0000 mg | Freq: Four times a day (QID) | INTRAMUSCULAR | Status: DC | PRN

## 2018-03-16 NOTE — ED Triage Notes (Signed)
Per EMS, pt states he "smoked some bad dope and needs to be checked out."  Pt told RN "I overdosed myself."  Pt alert and oriented, however he is very groggy and his clothing is urine soaked.

## 2018-03-16 NOTE — H&P (Signed)
Date: 03/16/2018               Patient Name:  [name] MRN: [id number]  DOB: [birth year] Age / Sex: 57 y.o., male   PCP: [name], Taylor AbrahamsMary Ann, NP         Medical Service: Internal Medicine Teaching Service         Attending Physician: Dr. Oswaldo [name], [name], *    First Contact: MS4 [name] Pager: [phone]  Second Contact: Dr. Crista [name] Pager: [phone]       After Hours (After 5p/  First Contact Pager: [phone]  weekends / holidays): Second Contact Pager: [phone]   Chief Complaint: Chest pain after "bad dope"  History of Present Illness:  This is a 57 year-old unhomed male with medical history significant for polysubstance use ("crack" cocaine, cannabis, EtOH), ?DM2, HTN, ?HFpEF, ?hepatitis C and schizophrenia with history of suicidal ideations who [facility] of chest pain after "overdosing on crack." He is somnolent and did not participate much in conversation but reports his nose and chest are hurting after taking some "bad dope" earlier this morning. Also admits to shortness of breath and dry cough. He denies taking any medications at home and denies this was an attempt to harm himself. He denies any fevers, productive cough, nausea, vomiting or abdominal pain.   In the ED, initial vital signs: temp 98.7*, pulse 89, BP 153/91 and saturating 92% on RA although EDP reports to me he was hypoxic to the mid-80's on RA which improved on 3L via [place]. STAT EKG without acute ischemic changes and I-stat troponin negative at 0.02. CMET with K 3.1, glucose 128 and albumin 2.7. CBC without leukocytosis and showed chronic stable normocytic anemia with Hb 10.9. Portable CXR with cardiomegaly, vascular congestion and perihilar edema. He was given IV Lasix 40mg  x1, started on CIWA and IMTS was contacted for admission.   This is his 53rd ED presentation since being released from prison in March although this is his first admission (admitted for hypoxia per ED).  The majority of these presentations are related to underlying psychiatric problems, food/housing/clothing needs, chronic foot pain or chest pain following cocaine use.   Meds: Patient is not taking any medications at home.  Allergies: Allergies as of 03/16/2018 - Review Complete 03/16/2018  Allergen Reaction Noted  . Haldol [haloperidol] Other (See Comments) 02/17/2013   Past Medical History:  Diagnosis Date  . CHF (congestive heart failure) (HCC)   . Chronic foot pain   . Cocaine abuse (HCC)   . Diabetes mellitus without complication (HCC)   . Hepatitis C   . Homelessness   . Hypertension   . Neuropathy   . Polysubstance abuse (HCC)   . Schizophrenia (HCC)    Family History: Patient unaware of family history. Chart review shows non-specific history of HTN and DM2.   Social History: Daily alcohol use, daily marijuana, cocaine and "crack" cocaine use. Admits to tobacco use > 25 years. Homeless. Receives care/support at the [facility].   Review of Systems: A complete ROS was negative except as per HPI.   Physical Exam: Blood pressure (!) 153/91, pulse 87, temperature 98.7 F (37.1 C), temperature source Oral, resp. rate 17, SpO2 95 %. General: Chronically-ill appearing [name] sleeping in bed. Disheveled with strong urine odor. Somnolent but arousable. Irritable.  HENT: PERRL. No conjunctival injection, icterus or ptosis. Oropharynx clear, mucous membranes moist.  Cardiovascular: Regular rate and rhythm. No murmur. Pulmonary:  Faint bibasilar crackles. Normal WOB on 3L.  Abdomen: Soft, non-tender. Normoactive bowel sounds.  Extremities: No peripheral edema noted BL. Intact distal pulses. Skin: Warm, dry. No cyanosis.  Neuro: Strength and sensation grossly intact and moves all extremities spontaneously, but patient non-cooperative with remainder of exam. Psych: Somnolent and irritable. No suicidal ideations. Does not appear to be responding to internal stimuli.  Unkept.   EKG: personally reviewed my interpretation is NSR with rate 87 bpm. Normal PR, inverted T-waves III, aVR, aVL, V1-V2. TWI V5-V6, I-II, aVL which are unchanged from prior. No ST Segment elevations or depressions.   1-CXR: personally reviewed my interpretation is patient rotated. Cardiomegaly with increased pulmonary vascular congestion. Mild pulmonary edema. Fibrosis.  07/2014 TTE:  - Left ventricle: The cavity size was normal. Wall thickness was increased in a pattern of moderate LVH. Systolic function was normal. The estimated ejection fraction was in the range of 55% to 60%. Wall motion was normal; there were no regional wall motion abnormalities. - Aortic valve: There was mild regurgitation. Valve area (VTI): 2.28 cm^2. Valve area (Vmax): 2.43 cm^2. Valve area (Vmean): 2.67 cm^2. - Left atrium: The atrium was severely dilated. - Right atrium: The atrium was moderately dilated  Assessment & Plan by Problem: Principal Problem:   Acute on chronic heart failure (HCC) Active Problems:   Diabetes mellitus (HCC)   Essential hypertension, benign   Schizoaffective disorder, bipolar type (HCC)   Cocaine abuse with cocaine-induced mood disorder (HCC)  Acute Respiratory Failure with Hypoxia (Reported) Patient reports onset of CP and SOB after using "bad dope/cocaine" earlier this morning. EDP noted hypoxia to mid-80's on RA with good waveform in physical exam w/ improvement on 3L via [place] although no hypoxia officially recorded in "Vitals" tab. CXR with cardiomegaly, increased pulmonary vascular congestion and interstitial findings consistent with mild pulmonary edema. Lungs with bibasilar crackles but no significant pitting edema. He is afebrile and without leukocytosis. Aspiration pneumonia vs pneumonitis considered however no focal consolidation, elevated leukocytosis or productive cough to suggest this. Suspect pulmonary edema and earlier chest pain related to recent cocaine  use.  -S/p IV Lasix 40mg  x1 in ED, will reassess need for additional medications -Daily weight, Strict I&O -Supplemental O2 as needed but please wean as able [Goal O2 sat >90%] -Ambulate while checking pulse ox  ?CHF History of CHF listed in chart however TTE 07/2014 with LVEF 55-60%, moderate LVH and no wall-motion abnormalities were appreciated. Also with mild aortic regurgitation and moderate-to-severe dilation of left and right atria. Similar findings found on prior [facility] dating back to 2006. Not candidate for BB's given ongoing cocaine use. -TTE ordered for clarification -Lasix as above  Atherosclerotic Disease Chart review shows diffuse atherosclerotic disease and dilation of distal abdominal aorta seen on CT 08/2017 with maximum transverse diameter 3.4 x 2.9 cm (3-year follow-up recommended). Most recent lipid panel 09/2016 with LDL 76. Unable to gather much history from patient however CP started after having "bad dope." Would not describe features but denied diaphoresis. Several similar presentations over the past few months without evidence of acute ischemic events although EKG does suggest old anteroinferior infarction. Troponin negative x1.  -S/p ASA 325mg  in ED, continue ASA 81 mg daily -Trend troponin -Telemetry -EKG prn -EKG in AM  Polysubstance Use (Mairjuana, Cocaine, Crack Cocaine) Substance-induced Mood Disorder Schizophrenia, Paranoid History of Suicidal Ideations without Plan No current suicidal ideations and does not appear to be responding to internal stimuli. Seen several times by psychiatry over the past few  months due to passive suicidal ideations and psychosis. 12/30/17 psychiatry recommended Risperdal 1mg  BID for psychosis and mood stabilization although he has not been taking any medications outpatient.  -No current indication for psychiatry consult or sitter -UDS ordered by ED pending  Alcohol Use Disorder Hypokalemia EtOH level pending. K 3.1, Mag 2.1 and Phos  3.3. Will replace K PO and monitor lytes. -CIWA w/ Ativan  ?Type 2 Diabetes Reported hx of diabetes however not on any medications and most recent A1c 5.8%. Highest A1c in our system is 8.4% 03/2013. Serum glucose 128 today. Will monitor and follow-up A1c.  -Custom sensitive sliding-scale insulin  ?HCV History of Hepatitis C noted in chart but no +'ve labs found. LFTs wnl. May benefit from [facility] referral if agreeable but doubtful. Ordering HepC Ab and HIV screens for clarification and possible need for referral.  -HIV, HepC Screens pending  Diet: HH/Carb mod IVF: None Electrolytes: Replace K, Mag, Phos as indicated DVT PPx: Lovenox Code: FULL Consults: SW  Dispo: Admit patient to Observation with expected length of stay less than 2 midnights. Suspect patient will leave AMA once he sobers-up and breathing improves.  SignedNoemi Chapel, DO 03/16/2018, 7:47 AM  Pager: [phone]

## 2018-03-16 NOTE — Discharge Summary (Signed)
Name: [name] MRN: [id number] DOB: 25-May-1961 57 y.o. PCP: [name], NP  Date of Admission: 03/16/2018  5:59 AM Date of Discharge: 03/16/2018 Attending Physician: Erlinda Hong, MD  Discharge Diagnosis: 1. Vasospasm, unable to determine origin due to below 2. Patient left without speaking to the physician or nursing team  Discharge Medications: Unable to provide discharge recommendations to patient.   Disposition and follow-up:   [name] was discharged from [facility] in unknown condition but on his own without known assistance and without notifying the hospital staff.  At the hospital follow up visit please address:  1.  Patient has new onset heart failure with reduced LVEF. Unable to reach patient due to lack of contact number in chart.  2.  Labs / imaging needed at time of follow-up: Cardiac evaluation   3.  Pending labs/ test needing follow-up: Echocardiogram  Follow-up Appointments:  Hospital Course by problem list: 1. Chest pain: Although there was concern for cocaine induced vasospasm on admission, the patient certainly has risk factors for coronary artery disease and given his newly found HFrEF he wants further evaluation for ischemic cardiomyopathy at the least. Unfortunately, he did not remain hospitalized leaving the hospital without notifying the nursing or physician staff. There was no number listed in the patients chart to attempt to contact him to notify him of the results. He will need to follow-up with his primary care provider at the least if someone is able to contact him.   Discharge Vitals:   BP (!) 146/87   Pulse 71   Temp 97.8 F (36.6 C) (Oral)   Resp 20   Ht 5\' 9"  (1.753 m)   Wt 73 kg   SpO2 96%   BMI 23.76 kg/m   Pertinent Labs, Studies, and Procedures:  CBC Latest Ref Rng & Units 03/16/2018 December 06, 202019 01/26/2018  WBC 4.0 - 10.5 K/uL 5.7 6.8 8.2  Hemoglobin 13.0 - 17.0 g/dL 10.9(L) 11.5(L)  11.4(L)  Hematocrit 39.0 - 52.0 % 35.9(L) 37.6(L) 35.3(L)  Platelets 150 - 400 K/uL 237 140(L) 203   CMP Latest Ref Rng & Units 03/16/2018 December 06, 202019 01/26/2018  Glucose 70 - 99 mg/dL 409(W) 119(J) 478(G)  BUN 6 - 20 mg/dL 9 10 6   Creatinine 0.61 - 1.24 mg/dL 9.56 2.13 0.86  Sodium 135 - 145 mmol/L 144 144 143  Potassium 3.5 - 5.1 mmol/L 3.1(L) 3.3(L) 3.3(L)  Chloride 98 - 111 mmol/L 108 109 110  CO2 22 - 32 mmol/L 27 26 26   Calcium 8.9 - 10.3 mg/dL 5.7(Q) 9.1 9.0  Total Protein 6.5 - 8.1 g/dL 5.2(L) 6.3(L) 6.7  Total Bilirubin 0.3 - 1.2 mg/dL 1.0 0.7 0.9  Alkaline Phos 38 - 126 U/L 80 87 78  AST 15 - 41 U/L 22 22 22   ALT 0 - 44 U/L 22 12 15    08:51 (03/16/18) 2wk ago (02/24/18) 75yr ago (12/21/14) 20yr ago (08/10/14) 2yr ago (07/29/14) 61yr ago (07/29/14)    Troponin I <0.03 ng/mL <0.03  <0.03 CM <0.03 R, CM 0.03 R, CM 0.03 R, CM 0.04High  R, CM   Echocardiogram 03/16/2018: Study Conclusions  - Left ventricle: The cavity size was normal. There was severe   concentric hypertrophy. Systolic function was moderately to   severely reduced. The estimated ejection fraction was in the   range of 30% to 35%. There is hypokinesis of the inferoseptal   myocardium. Doppler parameters are consistent with a reversible   restrictive pattern, indicative of  decreased left ventricular   diastolic compliance and/or increased left atrial pressure (grade   3 diastolic dysfunction). - Aortic valve: Trileaflet; mildly thickened, mildly calcified   leaflets. There was mild regurgitation. - Mitral valve: There was moderate regurgitation. - Left atrium: The atrium was severely dilated. Impressions: - EF is reduced when compared to prior.  Discharge Instructions:  Signed: Lanelle BalHarbrecht, Mauriah Mcmillen, MD 03/16/2018, 1:37 PM   Pager: Pager# [phone]

## 2018-03-16 NOTE — Progress Notes (Signed)
Patient has decided to leave against medical advice after demanding more food from staff. Patient was given food upon arrival to the unit and lunch was ordered. Patient was pacing back and forth to the desk cursing at staff about being homeless and [facility]. Patient did receive a lunch tray, ate it and still was verbally abusive to nursing staff disrupting the care of other patients. When asked to return to his room patient stated he was leaving. Telemetry and IV was removed. Patient got dressed, signed AMA document and walked out.

## 2018-03-16 NOTE — ED Provider Notes (Signed)
TIME SEEN: 6:18 AM  CHIEF COMPLAINT: Chest pain, cocaine use  HPI: Patient is a 57 year old male with history of diabetes, CHF, hypertension, homelessness, hepatitis C, substance abuse, schizophrenia who presents to the emergency department stating that he "overdosed on crack".  He states that he was "given bad dope".  States that his nose is hurting and his chest is hurting.  Denies any other drug or alcohol use.  Denies this was an attempt to hurt himself.  ROS: Level 5 caveat secondary to intoxication  PAST MEDICAL HISTORY/PAST SURGICAL HISTORY:  Past Medical History:  Diagnosis Date  . CHF (congestive heart failure) (HCC)   . Chronic foot pain   . Cocaine abuse (HCC)   . Diabetes mellitus without complication (HCC)   . Hepatitis C   . Homelessness   . Hypertension   . Neuropathy   . Polysubstance abuse (HCC)   . Schizophrenia (HCC)     MEDICATIONS:  Prior to Admission medications   Medication Sig Start Date End Date Taking? Authorizing Provider  carbamazepine (TEGRETOL) 100 MG chewable tablet Chew 100 mg by mouth daily.    [provider]    ALLERGIES:  Allergies  Allergen Reactions  . Haldol [Haloperidol] Other (See Comments)    Muscle spasms, loss of voluntary movement. However, pt has taken Thorazine on multiple occasions with no adverse effects.     SOCIAL HISTORY:  Social History   Tobacco Use  . Smoking status: Current Every Day Smoker    Packs/day: 1.00    Years: 20.00    Pack years: 20.00    Types: Cigarettes  . Smokeless tobacco: Current User  Substance Use Topics  . Alcohol use: Yes    Comment: Daily Drinker     FAMILY HISTORY: Family History  Problem Relation Age of Onset  . Hypertension Other   . Diabetes Other     EXAM: BP (!) 153/91 (BP Location: Right Arm)   Pulse 87   Temp 98.7 F (37.1 C) (Oral)   Resp 17   SpO2 95%  CONSTITUTIONAL: Alert very drowsy and falls asleep quickly.  He arouses to voice.  Appears intoxicated.   Smells of alcohol. HEAD: Normocephalic EYES: Conjunctivae clear, pupils appear equal, EOMI ENT: normal nose; moist mucous membranes NECK: Supple, no meningismus, no nuchal rigidity, no LAD  CARD: RRR; S1 and S2 appreciated; no murmurs, no clicks, no rubs, no gallops RESP: Normal chest excursion without splinting or tachypnea; breath sounds equal bilaterally, no wheezing or rhonchi, patient has bibasilar crackles, he has hypoxic into the mid 80s on room air at rest, no respiratory distress ABD/GI: Normal bowel sounds; non-distended; soft, non-tender, no rebound, no guarding, no peritoneal signs, no hepatosplenomegaly BACK:  The back appears normal and is non-tender to palpation, there is no CVA tenderness EXT: Normal ROM in all joints; non-tender to palpation; no edema; normal capillary refill; no cyanosis, no calf tenderness or swelling    SKIN: Normal color for age and race; warm; no rash NEURO: Moves all extremities equally PSYCH: Denies SI or intentional overdose.  Poor grooming.  MEDICAL DECISION MAKING: Patient here with complaints of chest pain after using crack cocaine.  Patient is here frequently and has history of mental illness, homelessness and polysubstance abuse.  Plan is to obtain labs, EKG, chest x-ray and monitor until clinically sober.  ED PROGRESS: Patient has a negative troponin.  Chest x-ray shows perihilar edema.  Given this and his history of CHF with his new oxygen requirement, will give  Lasix and discussed with medicine for admission.  7:47 AM Discussed patient's case with IM resident.  I have recommended admission and patient (and family if present) agree with this plan. Admitting physician will place admission orders.   I reviewed all nursing notes, vitals, pertinent previous records, EKGs, lab and urine results, imaging (as available).     EKG Interpretation  Date/Time:  Monday March 16 2018 07:05:36 EDT Ventricular Rate:  87 PR Interval:    QRS  Duration: 94 QT Interval:  392 QTC Calculation: 472 R Axis:   27 Text Interpretation:  Sinus rhythm Anterior infarct, old Abnormal T, consider ischemia, lateral leads No significant change since last tracing Confirmed by Amillion Macchia, Baxter HireKristen [phone]([id number]) on 03/16/2018 7:13:38 AM         Jovee Dettinger, Layla MawKristen N, DO 03/16/18 [id number]

## 2018-03-16 NOTE — Assessment & Plan Note (Deleted)
08/2017: Dilatation of the distal abdominal aorta with a measured transverse diameter 3.4 x 2.9 cm. Recommend followup by ultrasound 3 yrs

## 2018-03-16 NOTE — ED Notes (Signed)
Attempted report x1. 

## 2018-03-16 NOTE — Progress Notes (Signed)
  Echocardiogram 2D Echocardiogram has been performed.  [name], [name] R 03/16/2018, 10:53 AM

## 2018-03-16 NOTE — Progress Notes (Signed)
Spoke with Dr. Crista ElliotHarbrecht, made aware that the patient walked out AMA.

## 2018-03-17 LAB — HIV ANTIBODY (ROUTINE TESTING W REFLEX): HIV SCREEN 4TH GENERATION: NONREACTIVE

## 2018-03-23 ENCOUNTER — Emergency Department (HOSPITAL_COMMUNITY)
Admission: EM | Admit: 2018-03-23 | Discharge: 2018-03-23 | Disposition: A | Discharge status: Home / Self Care | Payer: Medicaid Other | Attending: Emergency Medicine | Admitting: Emergency Medicine

## 2018-03-23 ENCOUNTER — Emergency Department (HOSPITAL_COMMUNITY): Payer: Medicaid Other

## 2018-03-23 ENCOUNTER — Encounter (HOSPITAL_COMMUNITY): Payer: Self-pay | Admitting: *Deleted

## 2018-03-23 ENCOUNTER — Other Ambulatory Visit: Payer: Self-pay

## 2018-03-23 DIAGNOSIS — Z5321 Procedure and treatment not carried out due to patient leaving prior to being seen by health care provider: Secondary | ICD-10-CM | POA: Diagnosis not present

## 2018-03-23 DIAGNOSIS — R079 Chest pain, unspecified: Secondary | ICD-10-CM | POA: Insufficient documentation

## 2018-03-23 DIAGNOSIS — R0602 Shortness of breath: Secondary | ICD-10-CM | POA: Diagnosis not present

## 2018-03-23 LAB — BASIC METABOLIC PANEL
Anion gap: 8 (ref 5–15)
BUN: 18 mg/dL (ref 6–20)
CALCIUM: 8.8 mg/dL — AB (ref 8.9–10.3)
CO2: 26 mmol/L (ref 22–32)
CREATININE: 1.08 mg/dL (ref 0.61–1.24)
Chloride: 112 mmol/L — ABNORMAL HIGH (ref 98–111)
GFR calc non Af Amer: >60 mL/min (ref >60)
Glucose, Bld: 139 mg/dL — ABNORMAL HIGH (ref 70–99)
Potassium: 3.5 mmol/L (ref 3.5–5.1)
SODIUM: 146 mmol/L — AB (ref 135–145)

## 2018-03-23 LAB — CBC
HCT: 37.7 % — ABNORMAL LOW (ref 39.0–52.0)
Hemoglobin: 11.3 g/dL — ABNORMAL LOW (ref 13.0–17.0)
MCH: 28.6 pg (ref 26.0–34.0)
MCHC: 30 g/dL (ref 30.0–36.0)
MCV: 95.4 fL (ref 78.0–100.0)
PLATELETS: 219 10*3/uL (ref 150–400)
RBC: 3.95 MIL/uL — ABNORMAL LOW (ref 4.22–5.81)
RDW: 14.6 % (ref 11.5–15.5)
WBC: 7.1 10*3/uL (ref 4.0–10.5)

## 2018-03-23 LAB — TROPONIN I: TROPONIN I: 0.03 ng/mL — AB (ref <0.03)

## 2018-03-23 NOTE — ED Notes (Addendum)
Pt yelling and cursing at nurse first because I told him he would need to wait for lab results before he gets orange juice.  Pt continues to say that is the reason why he left AMA from the hospital when admitted and we are lying to him by not giving him orange juice.  Pt threw his blanket down and started pulling electrodes off chest and asked registration to cut armband off.  States he is leaving.  Pt went outside and started digging through the trash and got out Gatorade and soda.  Security talked with pt and told him he couldn't dig in trash and had to leave property if he was not going to stay to be seen.  Pt took bottles of drink from trash and walked towards street.

## 2018-03-23 NOTE — ED Triage Notes (Signed)
The pt coming in c/o sob and chest pain  He signed out ama from this hospital 2 days ago  He has been smoking crack and drinking alcohol.  He currently not sob

## 2018-03-26 ENCOUNTER — Emergency Department (HOSPITAL_COMMUNITY)
Admission: EM | Admit: 2018-03-26 | Discharge: 2018-03-26 | Disposition: A | Discharge status: Home / Self Care | Payer: Medicaid Other | Attending: Emergency Medicine | Admitting: Emergency Medicine

## 2018-03-26 ENCOUNTER — Encounter (HOSPITAL_COMMUNITY): Payer: Self-pay

## 2018-03-26 DIAGNOSIS — F1410 Cocaine abuse, uncomplicated: Secondary | ICD-10-CM | POA: Insufficient documentation

## 2018-03-26 DIAGNOSIS — Z79899 Other long term (current) drug therapy: Secondary | ICD-10-CM | POA: Diagnosis not present

## 2018-03-26 DIAGNOSIS — F17210 Nicotine dependence, cigarettes, uncomplicated: Secondary | ICD-10-CM | POA: Insufficient documentation

## 2018-03-26 DIAGNOSIS — F1490 Cocaine use, unspecified, uncomplicated: Secondary | ICD-10-CM

## 2018-03-26 DIAGNOSIS — R45851 Suicidal ideations: Secondary | ICD-10-CM | POA: Diagnosis not present

## 2018-03-26 DIAGNOSIS — R600 Localized edema: Secondary | ICD-10-CM | POA: Insufficient documentation

## 2018-03-26 DIAGNOSIS — Z5900 Homelessness unspecified: Secondary | ICD-10-CM

## 2018-03-26 DIAGNOSIS — E1140 Type 2 diabetes mellitus with diabetic neuropathy, unspecified: Secondary | ICD-10-CM | POA: Diagnosis not present

## 2018-03-26 DIAGNOSIS — I110 Hypertensive heart disease with heart failure: Secondary | ICD-10-CM | POA: Insufficient documentation

## 2018-03-26 DIAGNOSIS — I509 Heart failure, unspecified: Secondary | ICD-10-CM | POA: Diagnosis not present

## 2018-03-26 LAB — COMPREHENSIVE METABOLIC PANEL
ALBUMIN: 2.8 g/dL — AB (ref 3.5–5.0)
ALT: 16 U/L (ref 0–44)
AST: 23 U/L (ref 15–41)
Alkaline Phosphatase: 84 U/L (ref 38–126)
Anion gap: 7 (ref 5–15)
BILIRUBIN TOTAL: 0.6 mg/dL (ref 0.3–1.2)
BUN: 14 mg/dL (ref 6–20)
CHLORIDE: 109 mmol/L (ref 98–111)
CO2: 25 mmol/L (ref 22–32)
CREATININE: 0.9 mg/dL (ref 0.61–1.24)
Calcium: 8.8 mg/dL — ABNORMAL LOW (ref 8.9–10.3)
GFR calc Af Amer: >60 mL/min (ref >60)
GFR calc non Af Amer: >60 mL/min (ref >60)
GLUCOSE: 176 mg/dL — AB (ref 70–99)
POTASSIUM: 3.1 mmol/L — AB (ref 3.5–5.1)
Sodium: 141 mmol/L (ref 135–145)
TOTAL PROTEIN: 5.5 g/dL — AB (ref 6.5–8.1)

## 2018-03-26 LAB — CBC
HEMATOCRIT: 38.8 % — AB (ref 39.0–52.0)
Hemoglobin: 11.6 g/dL — ABNORMAL LOW (ref 13.0–17.0)
MCH: 28.6 pg (ref 26.0–34.0)
MCHC: 29.9 g/dL — ABNORMAL LOW (ref 30.0–36.0)
MCV: 95.6 fL (ref 78.0–100.0)
Platelets: 196 10*3/uL (ref 150–400)
RBC: 4.06 MIL/uL — AB (ref 4.22–5.81)
RDW: 14.2 % (ref 11.5–15.5)
WBC: 7.6 10*3/uL (ref 4.0–10.5)

## 2018-03-26 LAB — SALICYLATE LEVEL: Salicylate Lvl: <7 mg/dL (ref 2.8–30.0)

## 2018-03-26 LAB — ETHANOL

## 2018-03-26 LAB — ACETAMINOPHEN LEVEL: Acetaminophen (Tylenol), Serum: <10 ug/mL — ABNORMAL LOW (ref 10–30)

## 2018-03-26 MED ORDER — ACETAMINOPHEN 325 MG PO TABS
650.0000 mg | ORAL_TABLET | Freq: Once | ORAL | Status: AC
  Administered 2018-03-26: 650 mg via ORAL
  Filled 2018-03-26: qty 2

## 2018-03-26 MED ORDER — POTASSIUM CHLORIDE CRYS ER 20 MEQ PO TBCR
40.0000 meq | EXTENDED_RELEASE_TABLET | Freq: Once | ORAL | Status: AC
  Administered 2018-03-26: 40 meq via ORAL
  Filled 2018-03-26: qty 2

## 2018-03-26 NOTE — ED Triage Notes (Signed)
Patient BIB for suicidal ideations. Per EMS patient states "I'm going to jump in front of a car and kill myself". Patient also c/o chest pain and urinating on himself. Denies SOB, N/V. Patient reports smoking crack today.   BP 180/90 HR 84 RR 18  CBG 200

## 2018-03-26 NOTE — ED Notes (Signed)
Patient verbalizes understanding of medications and discharge instructions. No further questions at this time. VSS and patient ambulatory at discharge.    Patient given [place] sandwich, drink, socks, and bag for belongings.

## 2018-03-26 NOTE — ED Notes (Signed)
Patient ambulated to bathroom without assistance. Steady gait noted.  

## 2018-03-26 NOTE — Discharge Instructions (Addendum)
Substance Abuse Treatment Programs ° °Intensive Outpatient Programs °[facility]     °[address]      °[place], [place]                   °[phone]      ° °[facility] °[address] °[place], [place] °[phone] ° °[facility]     °(Inpatient and outpatient)     °[address]           °[phone]   ° °[facility] °[phone] (Suboxone and Methadone) ° °[address]      °[place], [place] [address]      °336-882-2125      ° °[address] °[place], [place] °852-3033 ° °[facility] (Outpatient/Inpatient, Chemical)    °(insurance only) [phone]      °       °[facility]Groups & Residential) °[place], [place] °[phone] ° °   °[facility]     °[address]     °[place], [place]      °[phone]      ° °[facility] (for caregivers and family) °[address] °[place], [place] °336-299-4655 ° ° ° ° ° °Residential Treatment Programs °[facility]      °[address], [place], [place] [address]  °([phone]      ° °[facility] °[address] [place], [place] [address] °[phone] ° °[facility]        °336-248-8914      ° °[facility] °1-800-659-3381 ° °[facility]             °[address]                                         °[place], [place]                                                °877-615-2722 or 336-784-9470                              ° °[facility] [place] °[address] °[place], [address] °1.877.941.8954 ° °[facility]    °[address]      °[place], [place]     °[phone]      ° °[facility] °[address] °[place], [place] °[phone] ° °[facility]   °[address]     °[place], [place] [address]     °[phone]      °Admissions: 8am-3pm M-F ° °[facility] °[address] °[place],  [place] °336-227-7417 ° °[facility]: Residential Program (90 Days)   °[place], [place]      °336-725-8389 or 800-758-6077    ° °[facility] °[place], [place] °(Walk in Hours over the weekend or by referral) ° °[facility] °[address], [place], [place] [address] °([phone]) [phone] ° °Crisis Mobile: [facility]:  1-877-626-1772 (for crisis response 24 hours a day) °[facility] Hotline:      1-800-256-2452 °Outpatient Psychiatry and Counseling ° °[facility]: Mobile Crisis   Management 24 hours:  1-877-626-1772 ° °[facility] sliding scale fee and walk in schedule: M-F 8am-12pm/1pm-3pm °[address]  °[place], [place] [address] °[phone] ° °[facility] °[address] °[place], [place] [address] °336-703-9650 ° °[facility] (Former[facility] known [name])- new patient walk-in appointments available Monday - Friday 8am -3pm.          °[address] °[place], [place] [address] °336-676-6840 or crisis line- 336-676-6905 ° °[facility] Intensive Outpatient [facility] °[address] °[place], [place] [address] °336-832-9804 ° °[facility]                  °Crisis Services      °[phone]      °[address]     °[place], [place] [address]                ° °[facility]   °[facility] °[phone] °[address] °[place], [place] [address] ° ° °[facility]          °[address]  °[place], [place] [address]       °([phone] ° °[facility] °[address] °[place], [place] [address] °336-292-1510 ° °[facility]    °[address]    °[place], [place] [address]     °[phone]      ° °Parish McKinney, MD     °[address]     °[place] [address]     °336-282-1251     °  °[facility] °[address] °[place] [address] ° °[facility]     °[address]     °[place], [place]      °336-542-2076      ° °[facility] °Shamsher Ahluwalia, MD °[address] °[place], [place] [address] °[phone] ° °[facility]     °[address]     °[place], [place] [address]     °336-274-1237      ° °[facility] °[address] °[place], [place] [address] °336-854-4450 °Resources for Temporary Residential Assistance/Crisis Centers ° °DAY CENTERS °[facility] °M-F 8am-3pm   °[address] [place], [place] [address]   336-332-0824 °Services include: laundry, barbering, support groups, case management, phone  & computer access, showers, AA/NA mtgs, mental health/substance abuse nurse, job skills class, disability information, VA assistance, spiritual classes, etc.  ° °HOMELESS SHELTERS ° ° [facility]     °[name]   °[address], [place]     °[phone]       °       °[facility] (women and children)       °[address] °, [place] [address] °336-275-0820 °[email or web address] for application and process °Application Required ° °[facility]   °[address]    °[place] [address]     °[phone]       °             °[facility] °[address] °,  [address] °336.273.5572 °336-235-0363(schedule application appt.) °Application Required ° °[facility] (women only)    °[address]     °[place],  [address]     °336-884-1039      °  Intake starts 6pm daily °Need valid ID, SSC, & Police report °[facility] [place] °[address] °[place], [place] °336-881-5420 °Application Required ° °[facility] (men only)     °[address]      °[place], [place]     °[phone]      ° °[facility] °(Pregnant women only) °[address] °[place], [place] °336-275-0206 ° °[facility]      °[address]      °[place], [place] [address]     °[phone]      °       °[facility] °[address] °[place], [place] °336-723-1848 °90 day commitment/SA/Application process ° °[facility](men only)     °[address]     °[place], [place]     °[phone]       °Check-in at 7pm     °       °[facility] °[address] °[place], [place] [address] °336-248-6684 °Men/Women/Women and Children must be there by 7 pm ° °[facility] °[place], [place] °336-722-8721                ° °

## 2018-03-26 NOTE — ED Provider Notes (Signed)
[facility] EMERGENCY DEPARTMENT Provider Note   CSN: [id number] Arrival date & time: 03/26/18  0414     History   Chief Complaint Chief Complaint  Patient presents with  . Suicidal    HPI [name] is a 57 y.o. male who presents with SI. PMH significant for frequent ED visits, homelessness, schizophrenia, CHF, chronic pain, substance abuse. He called EMS at a gas station and told them he's suicidal. He states he wants to jump in front of a car. He had pan-positive review of systems for EMS. He admits to smoking crack earlier. The patient briefly will wake up to questioning but has slurred speech and give mostly unintelligible answers.  LEVEL 5 caveat due to psychiatric d/o  HPI  Past Medical History:  Diagnosis Date  . CHF (congestive heart failure) (HCC)   . Chronic foot pain   . Cocaine abuse (HCC)   . Diabetes mellitus without complication (HCC)   . Hepatitis C   . Homelessness   . Hypertension   . Neuropathy   . Polysubstance abuse (HCC)   . Schizophrenia [facility])     Patient Active Problem List   Diagnosis Date Noted  . Acute on chronic heart failure (HCC) 03/16/2018  . Prostate enlargement 03/16/2018  . Aortic atherosclerosis (HCC) 03/16/2018  . 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 Cocaine abuse with cocaine-induced mood disorder (HCC) 09/18/2017  . Chronic foot pain   . Schizoaffective disorder, bipolar type (HCC) 09/30/2016  . Suicidal thoughts   . Substance induced mood disorder (HCC) 03/13/2015  . Acute kidney failure (HCC) 01/26/2015  . Schizophrenia, paranoid type (HCC) 01/17/2015  . Suicidal ideation   . Drug hallucinosis (HCC) 10/08/2014  . Chronic paranoid schizophrenia (HCC) 09/07/2014  . 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 Acute CHF (congestive heart failure) (HCC) 07/29/2014  . Cocaine use disorder, severe, dependence (HCC)   .  Cocaine abuse (HCC) 03/10/2014  . Essential hypertension, benign 03/28/2013  . Diabetes mellitus (HCC) 03/15/2013    History reviewed. No pertinent surgical history.      Home Medications    Prior to Admission medications   Medication Sig Start Date End Date Taking? Authorizing Provider  ARIPiprazole (ABILIFY) 5 MG tablet Take 5 mg by mouth daily.    [provider]  carbamazepine (TEGRETOL) 100 MG chewable tablet Chew 100 mg by mouth daily.    [provider]    Family History Family History  Problem Relation Age of Onset  . Hypertension Other   . Diabetes Other     Social History Social History   Tobacco Use  . Smoking status: Current Every Day Smoker    Packs/day: 1.00    Years: 20.00    Pack years: 20.00    Types: Cigarettes  . Smokeless tobacco: Current User  Substance Use Topics  . Alcohol use: Yes    Comment: Daily Drinker   . Drug use: Yes    Frequency: 7.0 times per week    Types: "Crack" cocaine, Cocaine, Marijuana    Comment: Cocaine tonight, Marijuana "a long time"     Allergies   Haldol [haloperidol]   Review of Systems Review of Systems  Unable to perform ROS: Psychiatric disorder     Physical Exam Updated Vital Signs BP (!) 187/100 (BP Location: Left Arm)   Pulse 95   Temp 98.9 F (37.2 C) (Oral)   Resp 14   SpO2  93%   Physical Exam  Constitutional: He is oriented to person, place, and time. He appears well-developed and well-nourished. No distress.  Sleeping but arousable to touch. Disheveled and malodorous  HENT:  Head: Normocephalic and atraumatic.  Eyes: Pupils are equal, round, and reactive to light. Conjunctivae are normal. Right eye exhibits no discharge. Left eye exhibits no discharge. No scleral icterus.  Neck: Normal range of motion.  Cardiovascular: Normal rate and regular rhythm.  Pulmonary/Chest: Effort normal. No respiratory distress. He has wheezes (mild expiratory wheeze).  Abdominal: Soft. Bowel  sounds are normal. He exhibits no distension. There is no tenderness.  Musculoskeletal:  2+ bilateral pitting edema  Neurological: He is alert and oriented to person, place, and time.  Skin: Skin is warm and dry.  Psychiatric: He has a normal mood and affect. His behavior is normal.  Nursing note and vitals reviewed.    ED Treatments / Results  Labs (all labs ordered are listed, but only abnormal results are displayed) Labs Reviewed  COMPREHENSIVE METABOLIC PANEL  ETHANOL  SALICYLATE LEVEL  ACETAMINOPHEN LEVEL  CBC    EKG EKG Interpretation  Date/Time:  Thursday March 26 2018 04:42:41 EDT Ventricular Rate:  94 PR Interval:  142 QRS Duration: 86 QT Interval:  376 QTC Calculation: 470 R Axis:   -47 Text Interpretation:  Sinus rhythm with Premature atrial complexes Left anterior fascicular block Septal infarct , age undetermined T wave abnormality, consider lateral ischemia Abnormal ECG Confirmed by Zadie Rhine ([id number]) on 03/26/2018 5:03:19 AM   Radiology No results found.  Procedures Procedures (including critical care time)  Medications Ordered in ED Medications - No data to display   Initial Impression / Assessment and Plan / ED Course  I have reviewed the triage vital signs and the nursing notes.  Pertinent labs & imaging results that were available during my care of the patient were reviewed by me and considered in my medical decision making (see chart for details).  57 year old male presents with SI. He has had 51 visits in the past 6 months. He is hypertensive but other vitals are normal. Labs were drawn for medical clearance however the patient is most likely malingering therefore we will not consult TTS and likely discharge once the patient is more awake and can ambulate.  Labs are unremarkable, pt ambulated and had tolerated PO. Will d/c  Final Clinical Impressions(s) / ED Diagnoses   Final diagnoses:  Crack cocaine use  Homeless    ED  Discharge Orders    None       Beryle Quant 03/31/18 2009    Zadie Rhine, MD 04/03/18 [phone]

## 2018-03-28 ENCOUNTER — Emergency Department (HOSPITAL_COMMUNITY): Payer: Medicare Other

## 2018-03-28 ENCOUNTER — Other Ambulatory Visit: Payer: Self-pay

## 2018-03-28 ENCOUNTER — Encounter (HOSPITAL_COMMUNITY): Payer: Self-pay | Admitting: Emergency Medicine

## 2018-03-28 ENCOUNTER — Inpatient Hospital Stay (HOSPITAL_COMMUNITY)
Admission: EM | Admit: 2018-03-28 | Discharge: 2018-03-30 | DRG: 292 | Disposition: A | Discharge status: Home / Self Care | Payer: Medicare Other | Attending: Student in an Organized Health Care Education/Training Program | Admitting: Student in an Organized Health Care Education/Training Program

## 2018-03-28 DIAGNOSIS — F209 Schizophrenia, unspecified: Secondary | ICD-10-CM | POA: Diagnosis present

## 2018-03-28 DIAGNOSIS — Z7982 Long term (current) use of aspirin: Secondary | ICD-10-CM | POA: Diagnosis not present

## 2018-03-28 DIAGNOSIS — F14929 Cocaine use, unspecified with intoxication, unspecified: Secondary | ICD-10-CM | POA: Diagnosis not present

## 2018-03-28 DIAGNOSIS — I5023 Acute on chronic systolic (congestive) heart failure: Secondary | ICD-10-CM | POA: Diagnosis not present

## 2018-03-28 DIAGNOSIS — I110 Hypertensive heart disease with heart failure: Secondary | ICD-10-CM | POA: Diagnosis not present

## 2018-03-28 DIAGNOSIS — F17200 Nicotine dependence, unspecified, uncomplicated: Secondary | ICD-10-CM

## 2018-03-28 DIAGNOSIS — I5043 Acute on chronic combined systolic (congestive) and diastolic (congestive) heart failure: Secondary | ICD-10-CM | POA: Diagnosis present

## 2018-03-28 DIAGNOSIS — Z590 Homelessness: Secondary | ICD-10-CM

## 2018-03-28 DIAGNOSIS — N138 Other obstructive and reflux uropathy: Secondary | ICD-10-CM | POA: Diagnosis present

## 2018-03-28 DIAGNOSIS — R451 Restlessness and agitation: Secondary | ICD-10-CM | POA: Diagnosis not present

## 2018-03-28 DIAGNOSIS — Z79899 Other long term (current) drug therapy: Secondary | ICD-10-CM

## 2018-03-28 DIAGNOSIS — Z888 Allergy status to other drugs, medicaments and biological substances status: Secondary | ICD-10-CM | POA: Diagnosis not present

## 2018-03-28 DIAGNOSIS — K0889 Other specified disorders of teeth and supporting structures: Secondary | ICD-10-CM

## 2018-03-28 DIAGNOSIS — N320 Bladder-neck obstruction: Secondary | ICD-10-CM | POA: Diagnosis present

## 2018-03-28 DIAGNOSIS — N401 Enlarged prostate with lower urinary tract symptoms: Secondary | ICD-10-CM | POA: Diagnosis present

## 2018-03-28 DIAGNOSIS — I429 Cardiomyopathy, unspecified: Secondary | ICD-10-CM | POA: Diagnosis present

## 2018-03-28 DIAGNOSIS — R3916 Straining to void: Secondary | ICD-10-CM

## 2018-03-28 DIAGNOSIS — Z833 Family history of diabetes mellitus: Secondary | ICD-10-CM

## 2018-03-28 DIAGNOSIS — Z960 Presence of urogenital implants: Secondary | ICD-10-CM | POA: Diagnosis not present

## 2018-03-28 DIAGNOSIS — F1490 Cocaine use, unspecified, uncomplicated: Secondary | ICD-10-CM | POA: Diagnosis not present

## 2018-03-28 DIAGNOSIS — Z794 Long term (current) use of insulin: Secondary | ICD-10-CM

## 2018-03-28 DIAGNOSIS — F1910 Other psychoactive substance abuse, uncomplicated: Secondary | ICD-10-CM

## 2018-03-28 DIAGNOSIS — F1423 Cocaine dependence with withdrawal: Secondary | ICD-10-CM | POA: Diagnosis present

## 2018-03-28 DIAGNOSIS — R3912 Poor urinary stream: Secondary | ICD-10-CM

## 2018-03-28 DIAGNOSIS — R339 Retention of urine, unspecified: Secondary | ICD-10-CM | POA: Diagnosis present

## 2018-03-28 DIAGNOSIS — N400 Enlarged prostate without lower urinary tract symptoms: Secondary | ICD-10-CM | POA: Diagnosis present

## 2018-03-28 DIAGNOSIS — Z23 Encounter for immunization: Secondary | ICD-10-CM | POA: Diagnosis not present

## 2018-03-28 DIAGNOSIS — E119 Type 2 diabetes mellitus without complications: Secondary | ICD-10-CM

## 2018-03-28 DIAGNOSIS — F17210 Nicotine dependence, cigarettes, uncomplicated: Secondary | ICD-10-CM | POA: Diagnosis present

## 2018-03-28 DIAGNOSIS — Z720 Tobacco use: Secondary | ICD-10-CM | POA: Diagnosis not present

## 2018-03-28 DIAGNOSIS — Z8249 Family history of ischemic heart disease and other diseases of the circulatory system: Secondary | ICD-10-CM

## 2018-03-28 DIAGNOSIS — F1420 Cocaine dependence, uncomplicated: Secondary | ICD-10-CM | POA: Diagnosis present

## 2018-03-28 DIAGNOSIS — I509 Heart failure, unspecified: Secondary | ICD-10-CM

## 2018-03-28 LAB — CBC
HEMATOCRIT: 37.2 % — AB (ref 39.0–52.0)
Hemoglobin: 11.1 g/dL — ABNORMAL LOW (ref 13.0–17.0)
MCH: 28.5 pg (ref 26.0–34.0)
MCHC: 29.8 g/dL — AB (ref 30.0–36.0)
MCV: 95.4 fL (ref 78.0–100.0)
Platelets: 186 10*3/uL (ref 150–400)
RBC: 3.9 MIL/uL — AB (ref 4.22–5.81)
RDW: 14.6 % (ref 11.5–15.5)
WBC: 11.2 10*3/uL — ABNORMAL HIGH (ref 4.0–10.5)

## 2018-03-28 LAB — BASIC METABOLIC PANEL
Anion gap: 7 (ref 5–15)
Anion gap: 5 (ref 5–15)
BUN: 14 mg/dL (ref 6–20)
BUN: 12 mg/dL (ref 6–20)
CHLORIDE: 108 mmol/L (ref 98–111)
CO2: 25 mmol/L (ref 22–32)
CO2: 31 mmol/L (ref 22–32)
CREATININE: 0.97 mg/dL (ref 0.61–1.24)
Calcium: 8.8 mg/dL — ABNORMAL LOW (ref 8.9–10.3)
Calcium: 8.9 mg/dL (ref 8.9–10.3)
Chloride: 106 mmol/L (ref 98–111)
Creatinine, Ser: 0.89 mg/dL (ref 0.61–1.24)
GFR calc Af Amer: >60 mL/min (ref >60)
GFR calc non Af Amer: >60 mL/min (ref >60)
GFR calc non Af Amer: >60 mL/min (ref >60)
GLUCOSE: 193 mg/dL — AB (ref 70–99)
Glucose, Bld: 157 mg/dL — ABNORMAL HIGH (ref 70–99)
Potassium: 3.9 mmol/L (ref 3.5–5.1)
Potassium: 4.2 mmol/L (ref 3.5–5.1)
SODIUM: 142 mmol/L (ref 135–145)
Sodium: 140 mmol/L (ref 135–145)

## 2018-03-28 LAB — GLUCOSE, CAPILLARY
GLUCOSE-CAPILLARY: 196 mg/dL — AB (ref 70–99)
GLUCOSE-CAPILLARY: 145 mg/dL — AB (ref 70–99)
GLUCOSE-CAPILLARY: 113 mg/dL — AB (ref 70–99)
Glucose-Capillary: 163 mg/dL — ABNORMAL HIGH (ref 70–99)

## 2018-03-28 LAB — I-STAT TROPONIN, ED: TROPONIN I, POC: 0.06 ng/mL (ref 0.00–0.08)

## 2018-03-28 LAB — TROPONIN I
TROPONIN I: 0.03 ng/mL — AB (ref <0.03)
Troponin I: <0.03 ng/mL (ref <0.03)
Troponin I: 0.04 ng/mL (ref <0.03)

## 2018-03-28 LAB — MAGNESIUM: MAGNESIUM: 2.1 mg/dL (ref 1.7–2.4)

## 2018-03-28 LAB — BRAIN NATRIURETIC PEPTIDE: B Natriuretic Peptide: 410.3 pg/mL — ABNORMAL HIGH (ref 0.0–100.0)

## 2018-03-28 LAB — PHOSPHORUS: PHOSPHORUS: 4.1 mg/dL (ref 2.5–4.6)

## 2018-03-28 MED ORDER — LORAZEPAM 2 MG/ML IJ SOLN
1.0000 mg | Freq: Four times a day (QID) | INTRAMUSCULAR | Status: DC | PRN
  Administered 2018-03-28 – 2018-03-29 (×2): 1 mg via INTRAVENOUS
  Filled 2018-03-28 (×2): qty 1

## 2018-03-28 MED ORDER — FUROSEMIDE 10 MG/ML IJ SOLN
40.0000 mg | Freq: Two times a day (BID) | INTRAMUSCULAR | Status: DC
  Administered 2018-03-28 – 2018-03-29 (×3): 40 mg via INTRAVENOUS
  Filled 2018-03-28 (×3): qty 4

## 2018-03-28 MED ORDER — ACETAMINOPHEN 650 MG RE SUPP: 650.0000 mg | Freq: Four times a day (QID) | RECTAL | Status: DC | PRN

## 2018-03-28 MED ORDER — INSULIN ASPART 100 UNIT/ML ~~LOC~~ SOLN
0.0000 [IU] | Freq: Three times a day (TID) | SUBCUTANEOUS | Status: DC
  Administered 2018-03-28: 2 [IU] via SUBCUTANEOUS
  Administered 2018-03-29: 1 [IU] via SUBCUTANEOUS
  Administered 2018-03-29: 3 [IU] via SUBCUTANEOUS
  Administered 2018-03-30: 2 [IU] via SUBCUTANEOUS

## 2018-03-28 MED ORDER — ENOXAPARIN SODIUM 40 MG/0.4ML ~~LOC~~ SOLN
40.0000 mg | SUBCUTANEOUS | Status: DC
  Administered 2018-03-28 – 2018-03-30 (×3): 40 mg via SUBCUTANEOUS
  Filled 2018-03-28 (×3): qty 0.4

## 2018-03-28 MED ORDER — PNEUMOCOCCAL VAC POLYVALENT 25 MCG/0.5ML IJ INJ
0.5000 mL | INJECTION | INTRAMUSCULAR | Status: AC
  Administered 2018-03-29: 0.5 mL via INTRAMUSCULAR
  Filled 2018-03-28: qty 0.5

## 2018-03-28 MED ORDER — INFLUENZA VAC SPLIT QUAD 0.5 ML IM SUSY
0.5000 mL | PREFILLED_SYRINGE | INTRAMUSCULAR | Status: AC
  Administered 2018-03-29: 0.5 mL via INTRAMUSCULAR
  Filled 2018-03-28: qty 0.5

## 2018-03-28 MED ORDER — FUROSEMIDE 10 MG/ML IJ SOLN
40.0000 mg | Freq: Once | INTRAMUSCULAR | Status: AC
  Administered 2018-03-28: 40 mg via INTRAVENOUS
  Filled 2018-03-28: qty 4

## 2018-03-28 MED ORDER — DIAZEPAM 2 MG PO TABS
2.0000 mg | ORAL_TABLET | Freq: Once | ORAL | Status: AC
  Administered 2018-03-28: 2 mg via ORAL
  Filled 2018-03-28: qty 1

## 2018-03-28 MED ORDER — ACETAMINOPHEN 325 MG PO TABS: 650.0000 mg | ORAL_TABLET | Freq: Four times a day (QID) | ORAL | Status: DC | PRN

## 2018-03-28 NOTE — ED Triage Notes (Signed)
Pt to ED via [facility]> picked up at [employer] with complaints of chest pain and SOB after smoking crack. Pt sitting in wheelchair with head covered up with EMS blanket.  States he is here for CHF.

## 2018-03-28 NOTE — ED Provider Notes (Signed)
TIME SEEN: 3:03 AM  CHIEF COMPLAINT: Chest pain, shortness of breath  HPI: Patient is a 57 year old male with history of cardiomyopathy, substance abuse, schizophrenia who has a care plan and is well-known to our emergency department who presents today with chest pain and shortness of breath.  Was just admitted to the hospital for CHF exacerbation but left without full treatment.  Echocardiogram at that time showed systolic function severely reduced with ejection fraction of 30 to 35%.  History is limited as patient is extremely poor historian and appears intoxicated.  Echo 03/16/18:  Study Conclusions  - Left ventricle: The cavity size was normal. There was severe   concentric hypertrophy. Systolic function was moderately to   severely reduced. The estimated ejection fraction was in the   range of 30% to 35%. There is hypokinesis of the inferoseptal   myocardium. Doppler parameters are consistent with a reversible   restrictive pattern, indicative of decreased left ventricular   diastolic compliance and/or increased left atrial pressure (grade   3 diastolic dysfunction). - Aortic valve: Trileaflet; mildly thickened, mildly calcified   leaflets. There was mild regurgitation. - Mitral valve: There was moderate regurgitation. - Left atrium: The atrium was severely dilated.  Impressions:  - EF is reduced when compared to prior.  ROS: Level 5 caveat secondary to mental illness, intoxication, poor historian  PAST MEDICAL HISTORY/PAST SURGICAL HISTORY:  Past Medical History:  Diagnosis Date  . CHF (congestive heart failure) (HCC)   . Chronic foot pain   . Cocaine abuse (HCC)   . Diabetes mellitus without complication (HCC)   . Hepatitis C   . Homelessness   . Hypertension   . Neuropathy   . Polysubstance abuse (HCC)   . Schizophrenia (HCC)     MEDICATIONS:  Prior to Admission medications   Medication Sig Start Date End Date Taking? Authorizing Provider  ARIPiprazole  (ABILIFY) 5 MG tablet Take 5 mg by mouth daily.    [provider]  carbamazepine (TEGRETOL) 100 MG chewable tablet Chew 100 mg by mouth daily.    [provider]    ALLERGIES:  Allergies  Allergen Reactions  . Haldol [Haloperidol] Other (See Comments)    Muscle spasms, loss of voluntary movement. However, pt has taken Thorazine on multiple occasions with no adverse effects.     SOCIAL HISTORY:  Social History   Tobacco Use  . Smoking status: Current Every Day Smoker    Packs/day: 1.00    Years: 20.00    Pack years: 20.00    Types: Cigarettes  . Smokeless tobacco: Current User  Substance Use Topics  . Alcohol use: Yes    Comment: Daily Drinker     FAMILY HISTORY: Family History  Problem Relation Age of Onset  . Hypertension Other   . Diabetes Other     EXAM: BP (!) 171/105 (BP Location: Right Arm)   Pulse 98   Temp 99.1 F (37.3 C) (Oral)   Resp 18   SpO2 95%  CONSTITUTIONAL: Chronically ill-appearing, appears intoxicated HEAD: Normocephalic EYES: Conjunctivae clear, pupils appear equal, EOMI ENT: normal nose; moist mucous membranes NECK: Supple, no meningismus, no nuchal rigidity, no LAD  CARD: RRR; S1 and S2 appreciated; no murmurs, no clicks, no rubs, no gallops RESP: Normal chest excursion without splinting or tachypnea; breath sounds clear and equal bilaterally; no wheezes, no rhonchi, no rales, no hypoxia or respiratory distress, speaking full sentences ABD/GI: Normal bowel sounds; non-distended; soft, non-tender, no rebound, no guarding, no peritoneal signs,  no hepatosplenomegaly BACK:  The back appears normal and is non-tender to palpation, there is no CVA tenderness EXT: Normal ROM in all joints; non-tender to palpation; no edema; normal capillary refill; no cyanosis, no calf tenderness or swelling    SKIN: Normal color for age and race; warm; no rash NEURO: Moves all extremities equally PSYCH: Poor hygiene  MEDICAL DECISION MAKING:  Patient here with complaints of chest pain shortness of breath.  History very limited as patient appears intoxicated and is a poor historian.  EKG shows no new ischemic abnormality.  Chest x-ray shows pulmonary edema.  At this time he is not hypoxic.  First troponin negative.  Give IV Lasix and admit.  ED PROGRESS: 4:10 AM Discussed patient's case with IM resident, Dr. Lovenia Kim.  I have recommended admission and patient (and family if present) agree with this plan. Admitting physician will place admission orders.   I reviewed all nursing notes, vitals, pertinent previous records, EKGs, lab and urine results, imaging (as available).      EKG Interpretation  Date/Time:  Saturday March 28 2018 01:24:55 EDT Ventricular Rate:  98 PR Interval:  130 QRS Duration: 82 QT Interval:  368 QTC Calculation: 469 R Axis:   -24 Text Interpretation:  Normal sinus rhythm Septal infarct , age undetermined T wave abnormality, consider lateral ischemia Abnormal ECG No significant change since last tracing Confirmed by Ward, [name] [phone]) on 03/28/2018 3:04:06 AM         Ward, Layla Maw, DO 03/28/18 0410

## 2018-03-28 NOTE — Progress Notes (Signed)
Vincent MD ordered foley insertion for acute urinary retention. Pt had been urinating, bladder scan showed >930 mls. Foley inserted with minimal urine return, 100 mls of clear urine drained. Foley removed w/ blood clot in the tip of the foley. MD paged and ordered for reinsertion of foley. Foley was then reinserted w/ small amount of blood tinged urine follow by clear urine. 1300 ml's emptied from foley bag. Pt comfortable and resting, will continue to monitor.

## 2018-03-28 NOTE — ED Notes (Signed)
Pt fidgety and jumping during I&o. Pt started urinating over the catheter. I&O inacurate because some wasted on the bed.

## 2018-03-28 NOTE — ED Notes (Signed)
Bladder scan shows greater than 999. MD aware

## 2018-03-28 NOTE — H&P (Signed)
Date: 03/28/2018               Patient Name:  [name] MRN: [id number]  DOB: [birth year] Age / Sex: 57 y.o., male   PCP: Hilbert Corrigan [name], NP         Medical Service: Internal Medicine Teaching Service         Attending Physician: Dr. Oswaldo Done, Marquita Palms, *    First Contact: Dr. Avie Arenas Pager: [phone]  Second Contact: Dr. Crista Elliot Pager: [phone]       After Hours (After 5p/  First Contact Pager: [phone]  weekends / holidays): Second Contact Pager: [phone]   Chief Complaint: SOB  History of Present Illness:  [name] is a 57yo male with PMH HFrEF (30-35% 9/19), schizophrenia,  Cardiomyopathy, crack/cocaine use, and TIIDM presenting to the ED with lower abdominal pain, painful urination and SOB. History was somewhat difficult to obtain due to patient intoxication. Patient states that his lower abdomen has been hurting every time he tries to urinate. This is an ongoing problem for which he has had foleys in the past, he states is acutely worse this evening and more painful. He is able to urinate but has difficulty maintaining his stream and has to strain. He states that his penis also hurts, and "he believes something might have bit it." He denies being sexually active for the past five years and denies dysuria.  Patient states he has also had worsening SOB for the past week. He denies cough or chest pain. He was unsure if he'd had a recent illness but was last admitted to the IMT[facility] teaching service for new onset heart failure and vasospasm. He also endorses increasing lower leg swelling.  Patient states that he takes abilify and tegretol, but per chart review he appears to not have had these filled in at least the past month.   In ED BP 170/93, HR 84, afebrile. He was given lasix 40 mg IV.   Meds:  Current Meds  Medication Sig  . ARIPiprazole (ABILIFY) 5 MG tablet Take 5 mg by mouth daily.  . carbamazepine (TEGRETOL) 100 MG chewable tablet Chew 100 mg by mouth  daily.     Allergies: Allergies as of 03/28/2018 - Review Complete 03/28/2018  Allergen Reaction Noted  . Haldol [haloperidol] Other (See Comments) 02/17/2013   Past Medical History:  Diagnosis Date  . CHF (congestive heart failure) (HCC)   . Chronic foot pain   . Cocaine abuse (HCC)   . Diabetes mellitus without complication (HCC)   . Hepatitis C   . Homelessness   . Hypertension   . Neuropathy   . Polysubstance abuse (HCC)   . Schizophrenia (HCC)     Family History:  Family History  Problem Relation Age of Onset  . Hypertension Other   . Diabetes Other     Social History:  He has been unhomed since March 2019.  He states he smokes all the time and uses crack cocaine all the time. He denies alcohol use and other drug use.   Review of Systems: A complete ROS was negative except as per HPI.   Physical Exam: Blood pressure (!) 170/93, pulse (!) 101, temperature 99.1 F (37.3 C), temperature source Oral, resp. rate 18, SpO2 95 %.  Constitution: mild distress, restless HENT: poor dentition, difficult to understand Eyes: no scleral icterus, EOM intact Cardio: regular rate, distant heart sounds, no m/r/g Respiratory: tachypnic, course breath sounds bilaterally, rales RLL Abdominal: TTP suprapubic region  with voluntary guarding, non-distended, soft, hyperactive bs  MSK: +2 pitting edema, +radial pulses, moving all extremities GU: difficulty starting stream, start and stops, yellow urine, fatty lump dorsum of penis, non-tender on inspection Neuro: restless, oriented to self and place, in and out of sleep Skin: c/d/i  Bladder Scan: >999cc  EKG: personally reviewed my interpretation is NSR with lateral T-wave inversion similar previous EKGs.   CXR: personally reviewed my interpretation is interstitial edema, enlarged heart, hyperinflated lungs.  Assessment & Plan by Problem: Active Problems:   Acute exacerbation of CHF (congestive heart failure) (HCC)   Acute on  Chronic CHF: Diagnosed on previous admission with ECHO on 03/16/18 showing EF 30-35% EF with severe concentric hypertrophy & grade III diastolic dysfunction. He left AMA during this admission, and it does not appear he picked up his medications. His pitting edema, rales and cxr appear consistent with exacerbation of his recently diagnosed heart failure and needs an ischemic  Pneumonia is unlikely although he does have mild leukocytosis.  - lasix 40mg  IV bid - troponin x3 - am BMP, Mg - daily weights  - strict I/O's - admit to inpatient   Bladder Outlet Obstruction: Bladder scan >999. Patient stated that he did not want a foley or I/O as he was urinating. He says this is a chronic issue and CT 3/19 shows enlarged prostate. He does not currently have an AKI, but as he is having difficulty urinating he should be monitored for worsening kidney function.   - I/O cath if patient will accept  - BMP  - bladder scan q4h as he refused foley  TIIDM:  - SSI - q4 CBG  Diet: heart healthy  VTE: lovenox IVF: none Code: Full  Dispo: Admit patient to Inpatient with expected length of stay greater than 2 midnights.  SignedVersie Starks, DO 03/28/2018, 6:01 AM  Pager: [phone]

## 2018-03-28 NOTE — Progress Notes (Addendum)
   Subjective: The patient was sitting up in his bed today upon entering the room complaining of pain in his abdomin. He was grossly diaphoretic and concerned about a shower but otherwise denied other concerns. He pointed to lower abdominal/pelvic pain.  Objective:  Vital signs in last 24 hours: Vitals:   03/28/18 0330 03/28/18 0545 03/28/18 0611 03/28/18 0614  BP: (!) 174/95 (!) 170/93  (!) 158/89  Pulse: 92 (!) 101  (!) 102  Resp: 18   (!) 23  Temp:    98.4 F (36.9 C)  TempSrc:    Oral  SpO2: 96% 95%  96%  Weight:   79.8 kg   Height:   5\' 9"  (1.753 m)    Physical Exam: General: alert, not oriented to condition, diaphoretic, febrile  Cardio: RRR, no mrg's auscultated Pulm: CTA bilaterally  ABD: soft, tender in the lower subumbilical area  Assessment/Plan:  Principal Problem:   Acute exacerbation of CHF (congestive heart failure) (HCC) Active Problems:   Urinary retention  [name] is a 57 yo M w/ a PMHx notable as a person who frequently uses cocaine, HFrEF w/ GIII diastolic dysfunction which was recently diagnosed on an admission for which he left AMA who presented for severe dyspnea. He was started on treatment for acute on chronic combined heart failure exacerbation.  A/P: Acute on chronic combined heart failure exacerbation: Previously diagnosed on a prior admission w/ LVEF of 30-35% w/ severe hypertrophy and GIII DD. He will required diuresis and eventual ischemic evaluation prior to discharge given this is newly diagnosed and considering his history. We will attempt to diurese him today to better optimize him and to better control his symptoms. -Continue furosemide 40mg  IV BID -Daily BMP's to monitor renal function -Troponin trend unremarkable thus far, will complete -Daily weights -Strict I/O's -Continue Telemetry, but patient may shower  Bladder outlet obstruction:  Due to prostate enlargement. Bladder scan with >9103ml on admission. Difficulty with  foley placement with notable hemorrhagic discharge. Now draining well. 1.3L initially out. High risk for bladder stretch injury. Will need catheter for 4 weeks for bladder decompression, then follow up with urology. A foley will be hard for him to manage after discharge and he will need more support.  TIIDM:  -SSI Sens -TID WC and QHS CBG monitoring   Diet:HH ZOX:[id number] IVF: N/A Code: Full Dispo: Anticipated discharge in approximately 3 day(s).   Lanelle Bal, MD 03/28/2018, 7:23 AM Pager: Pager# [phone]   Internal Medicine Attending:   I saw and examined the patient. I reviewed the resident's note and I agree with the resident's findings and plan as documented in the resident's note. I made necessary changes to this note.  Erlinda Hong, MD

## 2018-03-29 ENCOUNTER — Other Ambulatory Visit: Payer: Self-pay

## 2018-03-29 DIAGNOSIS — F1490 Cocaine use, unspecified, uncomplicated: Secondary | ICD-10-CM

## 2018-03-29 LAB — BASIC METABOLIC PANEL
ANION GAP: 6 (ref 5–15)
Anion gap: 7 (ref 5–15)
BUN: 14 mg/dL (ref 6–20)
BUN: 18 mg/dL (ref 6–20)
CHLORIDE: 104 mmol/L (ref 98–111)
CO2: 29 mmol/L (ref 22–32)
CO2: 29 mmol/L (ref 22–32)
CREATININE: 0.89 mg/dL (ref 0.61–1.24)
Calcium: 9 mg/dL (ref 8.9–10.3)
Calcium: 8.9 mg/dL (ref 8.9–10.3)
Chloride: 102 mmol/L (ref 98–111)
Creatinine, Ser: 0.9 mg/dL (ref 0.61–1.24)
GFR calc Af Amer: >60 mL/min (ref >60)
GFR calc Af Amer: >60 mL/min (ref >60)
GFR calc non Af Amer: >60 mL/min (ref >60)
GLUCOSE: 144 mg/dL — AB (ref 70–99)
Glucose, Bld: 168 mg/dL — ABNORMAL HIGH (ref 70–99)
POTASSIUM: 3.9 mmol/L (ref 3.5–5.1)
Potassium: 4 mmol/L (ref 3.5–5.1)
SODIUM: 138 mmol/L (ref 135–145)
Sodium: 139 mmol/L (ref 135–145)

## 2018-03-29 LAB — GLUCOSE, CAPILLARY
GLUCOSE-CAPILLARY: 221 mg/dL — AB (ref 70–99)
Glucose-Capillary: 147 mg/dL — ABNORMAL HIGH (ref 70–99)
Glucose-Capillary: 143 mg/dL — ABNORMAL HIGH (ref 70–99)
Glucose-Capillary: 177 mg/dL — ABNORMAL HIGH (ref 70–99)

## 2018-03-29 LAB — OSMOLALITY, URINE: OSMOLALITY UR: 594 mosm/kg (ref 300–900)

## 2018-03-29 LAB — MAGNESIUM: Magnesium: 2.2 mg/dL (ref 1.7–2.4)

## 2018-03-29 MED ORDER — LISINOPRIL 5 MG PO TABS
5.0000 mg | ORAL_TABLET | Freq: Every day | ORAL | Status: DC
  Administered 2018-03-29 – 2018-03-30 (×2): 5 mg via ORAL
  Filled 2018-03-29 (×2): qty 1

## 2018-03-29 MED ORDER — SODIUM CHLORIDE 0.9 % IV SOLN
INTRAVENOUS | Status: AC
  Administered 2018-03-29 – 2018-03-30 (×2): via INTRAVENOUS

## 2018-03-29 MED ORDER — SODIUM CHLORIDE 0.9 % IV SOLN: INTRAVENOUS | Status: DC

## 2018-03-29 MED ORDER — SODIUM CHLORIDE 0.9 % IV SOLN
INTRAVENOUS | Status: DC
  Administered 2018-03-29: 11:00:00 via INTRAVENOUS

## 2018-03-29 NOTE — Progress Notes (Signed)
Internal Medicine Attending:   I saw and examined the patient. I reviewed the resident's note and I agree with the resident's findings and plan as documented in the resident's note.  Principal Problem:   Acute exacerbation of CHF (congestive heart failure) (HCC) Active Problems:   Diabetes mellitus (HCC)   Cocaine use disorder, severe, dependence (HCC)   Urinary retention  Patient admitted with acute on chronic HFrEF, now with too much diuresis yesterday, down 7kg due to post obstructive diuresis. I agree with stopping lasix and give some gentle fluids back. He looks euvolemic on exam. Hold centrally acting meds as he is sedated today. Continue foley care, he is very high risk for stretch injury and needs 4 weeks of bladder decompression.   Erlinda Hong, MD

## 2018-03-29 NOTE — Progress Notes (Signed)
   Subjective: The patient was resting in his bed today upon entering the room. He is soundly sleeping and difficult to awaken. He opens eyes to command and denies pain only before drifting off into sleep again.   Objective:  Vital signs in last 24 hours: Vitals:   03/29/18 0400 03/29/18 0450 03/29/18 0800 03/29/18 0821  BP: (!) 142/78   (!) 157/88  Pulse: (!) 104   99  Resp: (!) 23  17 (!) 25  Temp: 98.6 F (37 C)   98.5 F (36.9 C)  TempSrc: Oral   Oral  SpO2: 98%   98%  Weight:  72.7 kg    Height:       Physical exam: General: Somnolent, afebrile, nondiaphoretic, in no acute distress Cardio: RRR, no mrg's auscultated MSK: appears volume depleted with decreased skin turgor  Assessment/Plan:  Principal Problem:   Acute exacerbation of CHF (congestive heart failure) (HCC) Active Problems:   Diabetes mellitus (HCC)   Cocaine use disorder, severe, dependence (HCC)   Urinary retention  [name] is a 57 yo M w/ a PMHx notable as a person who frequently uses cocaine, HFrEF w/ GIII diastolic dysfunction which was recently diagnosed on an admission for which he left AMA who presented for severe dyspnea. He was started on treatment for acute on chronic combined heart failure exacerbation and bladder outlet obstruction.   A/P: Acute on chronic combined heart failure exacerbation: Previously diagnosed on a prior admission w/ LVEF of 30-35% w/ severe hypertrophy and GIII DD. He responded excessively to initial IV diuresis. I feel that there may be a component of post obstructive diuresis. As such, we will rehydrate cautiously over the next several hours with IV NS as he is somnolent and not taking PO.  Patient continues to saturate well on room air.  Heart rate has decreased.  He will need to be optimized with regard to his heart failure treatment prior to discharge.  We will discuss with him the addition of ACE inhibitor when the patient is more amenable.  Caution beta-blocker  use in this patient given his history as a person who uses cocaine. This will be challenging moving forward given his unhomed status.  -Will initiate lose dose ACEi today if patient amenable to therapy -Hold diuretics this am -Slow rehydration with 1L NS over 13 hours.  -Discontinue ativan  -Consider cardiology consultation for ischemic evaluation when patient more alert and agreeable   Bladder outlet obstruction:  Apparently due to prostate enlargement.  Patient experienced a large volume diuresis following foley placement. This is potentially 2/2 post obstructive diuresis. Serum electrolytes are currently WNL's with Na 139 and K+3.9. We will continue to closely monitor this.   -Will need urology consultation if he fails a voiding trial -Consider a1-antagonist vs 5-alpha reductase inhibitor   TIIDM: -SSI Sens -TID WC and QHS CBG monitoring   Diet: HH Code: Full VTE ppx: Enoxaparin Dispo: Anticipated discharge in approximately 1-2 day(s).   Lanelle Bal, MD 03/29/2018, 12:15 PM Pager: Pager# [phone]

## 2018-03-30 DIAGNOSIS — Z960 Presence of urogenital implants: Secondary | ICD-10-CM

## 2018-03-30 DIAGNOSIS — I5043 Acute on chronic combined systolic (congestive) and diastolic (congestive) heart failure: Secondary | ICD-10-CM

## 2018-03-30 DIAGNOSIS — Z79899 Other long term (current) drug therapy: Secondary | ICD-10-CM

## 2018-03-30 DIAGNOSIS — Z720 Tobacco use: Secondary | ICD-10-CM

## 2018-03-30 DIAGNOSIS — Z7982 Long term (current) use of aspirin: Secondary | ICD-10-CM

## 2018-03-30 LAB — BASIC METABOLIC PANEL
Anion gap: 4 — ABNORMAL LOW (ref 5–15)
BUN: 17 mg/dL (ref 6–20)
CALCIUM: 8.4 mg/dL — AB (ref 8.9–10.3)
CHLORIDE: 105 mmol/L (ref 98–111)
CO2: 27 mmol/L (ref 22–32)
CREATININE: 0.82 mg/dL (ref 0.61–1.24)
GFR calc Af Amer: >60 mL/min (ref >60)
GFR calc non Af Amer: >60 mL/min (ref >60)
Glucose, Bld: 172 mg/dL — ABNORMAL HIGH (ref 70–99)
Potassium: 3.8 mmol/L (ref 3.5–5.1)
SODIUM: 136 mmol/L (ref 135–145)

## 2018-03-30 LAB — GLUCOSE, CAPILLARY
Glucose-Capillary: 179 mg/dL — ABNORMAL HIGH (ref 70–99)
Glucose-Capillary: 139 mg/dL — ABNORMAL HIGH (ref 70–99)

## 2018-03-30 MED ORDER — LISINOPRIL 5 MG PO TABS: 5.0000 mg | ORAL_TABLET | Freq: Every day | ORAL | 0 refills | Status: DC

## 2018-03-30 MED ORDER — NICOTINE 14 MG/24HR TD PT24: 14.0000 mg | MEDICATED_PATCH | Freq: Every day | TRANSDERMAL | Status: DC

## 2018-03-30 MED ORDER — FUROSEMIDE 20 MG PO TABS: 20.0000 mg | ORAL_TABLET | Freq: Every day | ORAL | 0 refills | Status: DC

## 2018-03-30 MED ORDER — ASPIRIN EC 81 MG PO TBEC: 81.0000 mg | DELAYED_RELEASE_TABLET | Freq: Every day | ORAL | 0 refills | Status: DC

## 2018-03-30 MED ORDER — TAMSULOSIN HCL 0.4 MG PO CAPS: 0.4000 mg | ORAL_CAPSULE | Freq: Every day | ORAL | 0 refills | Status: DC

## 2018-03-30 MED ORDER — ATORVASTATIN CALCIUM 40 MG PO TABS: 40.0000 mg | ORAL_TABLET | Freq: Every day | ORAL | 0 refills | Status: DC

## 2018-03-30 MED ORDER — TAMSULOSIN HCL 0.4 MG PO CAPS
0.4000 mg | ORAL_CAPSULE | Freq: Every day | ORAL | Status: DC
  Administered 2018-03-30: 0.4 mg via ORAL
  Filled 2018-03-30: qty 1

## 2018-03-30 NOTE — Discharge Summary (Signed)
Name: [name] MRN: [id number] DOB: 1960-11-10 57 y.o. PCP: [name], NP  Date of Admission: 03/28/2018  1:20 AM Date of Discharge: 03/30/18 Attending Physician: Tyson Alias, *  Discharge Diagnosis: 1. Acute on chronic combined heart failure exacerbation: 2. Bladder outlet obstruction 2/2 BPH  Discharge Medications: Allergies as of 03/30/2018      Reactions   Haldol [haloperidol] Other (See Comments)   Muscle spasms, loss of voluntary movement. However, pt has taken Thorazine on multiple occasions with no adverse effects.       Medication List    TAKE these medications   ARIPiprazole 5 MG tablet Commonly known as:  ABILIFY Take 5 mg by mouth daily.   aspirin EC 81 MG tablet Take 1 tablet (81 mg total) by mouth daily.   atorvastatin 40 MG tablet Commonly known as:  LIPITOR Take 1 tablet (40 mg total) by mouth daily.   carbamazepine 100 MG chewable tablet Commonly known as:  TEGRETOL Chew 100 mg by mouth daily.   furosemide 20 MG tablet Commonly known as:  LASIX Take 1 tablet (20 mg total) by mouth daily.   lisinopril 5 MG tablet Commonly known as:  PRINIVIL,ZESTRIL Take 1 tablet (5 mg total) by mouth daily. Start taking on:  03/31/2018   tamsulosin 0.4 MG Caps capsule Commonly known as:  FLOMAX Take 1 capsule (0.4 mg total) by mouth daily. Start taking on:  03/31/2018       Disposition and follow-up:   Mr.Cleophas L Mclennan was discharged from [facility] in Good condition.  At the hospital follow up visit please address:  1.  Combined systolic and diastolic heart failure: started on aspirin 81, atorvastatin 40mg , lisinopril 5mg , and lasix 20mg . Given 30 day supply. Will need follow-up and medication management.  2. Bladder outlet obstruction 2/2 BPH: was able to void without foley at the time of discharge. Started on 0.4mg  flomax daily.   2.  Labs / imaging needed at time of follow-up: BMP to monitor lasix  and lisinopril  3.  Pending labs/ test needing follow-up: None  Follow-up Appointments: Follow-up Information    [facility] Follow up in 1 week(s).   Contact information: [address] [place] [address] [phone]          Hospital Course by problem list: [name] is a 57 yo M w/ a medical history notable as a person who frequently uses cocaine, HFrEF w/ GIII diastolic dysfunction (9/19 ECHO 30-35% EF) which was recently diagnosed on an admission for which he left AMA who presented for severe dyspnea.   1. Acute on chronic combined heart failure exacerbation: Pt presented with dyspnea and lower extremity edema in the setting of not picking up his heart failure medications after leaving AMA from an admission earlier in the month. He was diuresed with IV lasix with an exaggerated response of 6.5L urine and 7kg weight loss in one day. Diuresis was then held and urine output became more appropriate. He appeared euvolemic at discharge without dyspnea. He did not want to stay in the hospital a day longer, so cardiology consult was deferred. For his heart failure, he was started on aspirin, statin, ace-inhibitor, and lasix. Beta blockers were avoided given his cocaine use. He was provided with written prescriptions so he would be able to fill them at his pharmacy of choice at discharge. He was instructed to establish care with a PCP and given an office  number to call for follow-up of his medications. Patient was also advised to stop using crack cocaine.  2. Bladder outlet obstruction 2/2 BPH: Patient presented with abdominal pain and a palpable bladder up to the umbilicus. Bladder scan >957ml. Foley was placed with notable hemorrhagic discharge. Bedside US showed severely enlarged prostate. Excessive diuresis may have been in part due to postobstructive diuresis. Mr. Heinbaugh asked for a voiding trial and was able to urinate. He did  not want the foley replaced as it would be unmanageable for him given his unhomed status. Started flomax 0.4mg  daily.   Discharge Vitals:   BP (!) 160/98   Pulse 86   Temp 98.2 F (36.8 C) (Oral)   Resp 18   Ht 5\' 9"  (1.753 m)   Wt 76.3 kg Comment: Scale B  SpO2 99%   BMI 24.85 kg/m   Pertinent Labs, Studies, and Procedures:  CBC Latest Ref Rng & Units 03/28/2018 03/26/2018 03/23/2018  WBC 4.0 - 10.5 K/uL 11.2(H) 7.6 7.1  Hemoglobin 13.0 - 17.0 g/dL 11.1(L) 11.6(L) 11.3(L)  Hematocrit 39.0 - 52.0 % 37.2(L) 38.8(L) 37.7(L)  Platelets 150 - 400 K/uL 186 196 219   CMP Latest Ref Rng & Units 03/30/2018 03/29/2018 03/29/2018  Glucose 70 - 99 mg/dL 161(W) 960(A) 540(J)  BUN 6 - 20 mg/dL 17 18 14   Creatinine 0.61 - 1.24 mg/dL 8.11 9.14 7.82  Sodium 135 - 145 mmol/L 136 138 139  Potassium 3.5 - 5.1 mmol/L 3.8 4.0 3.9  Chloride 98 - 111 mmol/L 105 102 104  CO2 22 - 32 mmol/L 27 29 29   Calcium 8.9 - 10.3 mg/dL 9.5(A) 8.9 9.0  Total Protein 6.5 - 8.1 g/dL - - -  Total Bilirubin 0.3 - 1.2 mg/dL - - -  Alkaline Phos 38 - 126 U/L - - -  AST 15 - 41 U/L - - -  ALT 0 - 44 U/L - - -   CXR 1. Cardiomegaly and interstitial prominence most compatible with pulmonary edema. 2. Bibasilar atelectasis/scarring. 3.  Aortic Atherosclerosis (ICD10-I70.0).  Discharge Instructions: Discharge Instructions    Diet - low sodium heart healthy   Complete by:  As directed    Discharge instructions   Complete by:  As directed    It was a pleasure taking care of you, Mr. Hemmelgarn.  1. You have congestive heart failure. Please take the lisinopril 5mg  daily, lasix 20mg  daily, aspirin 81mg  daily, and atorvastatin 40mg  daily for this problem. Please stop using crack cocaine as that will worsen your heart disease. Please follow up with community health and wellness. Call the number provided to make an appointment as soon as you can. We have given you a prescription for 30 days worth of medications. Try to see the  community health and wellness providers before 30 days so your medications can be refilled. They will also need to check some blood work to make sure the medications are working well. Please see a doctor sooner if you have shortness of breath or chest pain.   2. You also have a large prostate that causes difficulty peeing. Please take the flomax 0.4mg  daily for this problem. Please see a doctor if you have abdominal pain or difficulty urinating.  Feel free to call our clinic at [phone] if you have any questions.  Thanks, Dr. Avie Arenas   Increase activity slowly   Complete by:  As directed       Signed: Dorrell, Cathleen Corti, MD 03/30/2018, 1:03 PM   Pager:  [phone]  

## 2018-03-30 NOTE — Care Management Note (Signed)
Case Management Note Donn Pierini RN, BSN Unit 4E- RN Care Coordinator  [phone]  Patient Details  Name: ALOK MINSHALL MRN: [id number] Date of Birth: 02/04/1961  Subjective/Objective:  Pt admitted with acute CHF                  Action/Plan: PTA pt was homeless, CSW consulted. CM spoke with pt regarding medications. Pt has Medicare A, is not eligible for MATCH with option for drug coverage. Discussed with pt importance of taking medications, reviewed cost, per pt he has used [name] for medication needs in the past, he is also open to using [facility] pharmacy if needed. Pt reports that he gets his check on Oct. 3 and will be able to buy is meds.   Expected Discharge Date:  03/30/18               Expected Discharge Plan:  Home/Self Care  In-House Referral:  Clinical Social Work  Discharge planning Services  CM Consult, Medication Assistance  Post Acute Care Choice:  NA Choice offered to:  NA  DME Arranged:    DME Agency:     HH Arranged:    HH Agency:     Status of Service:  Completed, signed off  If discussed at [employer] of Stay Meetings, dates discussed:    Discharge Disposition: home/homeless   Additional Comments:  Darrold Span, RN 03/30/2018, 12:37 PM

## 2018-03-30 NOTE — Progress Notes (Signed)
Reviewed AVS discharge instructions with patient/caregiver. Patient/caregiver verbalizes understanding of instructions received. AVS and prescriptions received by patient/caregiver. If present, telemetry box removed and central cardiac monitoring department notified of discharge. Peripheral IV removed, site benign with tip intact.  

## 2018-03-30 NOTE — Progress Notes (Signed)
Pt verbally aggressive with staff. Pt refusing to wear telemetry. Pt stating that "demons are messing with my tongue and I cant think." Pt wanting to sign out AMA. MD paged x3, this RN was told that they are working on d/c instructions. Pt unwilling to wait for medications. Pt signing out AMA.  Versie Starks, RN

## 2018-03-30 NOTE — Progress Notes (Signed)
Subjective: No overnight events. [name] is much more alert today than he has been since admission. He reports that the foley catheter is bothering him and he wants it out. He states that he wants to leave the hospital today so he can go pick up his disability check and find a place to stay. [facility] he can find a place to stay. He reports that he wants to stop using crack cocaine and that he will take medications if he is supplied with them. He states understanding that his future health is contingent upon him taking his medications and stopping cocaine use.   Paged by nursing hours later that the patient appeared agitated and wanted to leave the hospital. Evaluated the patient at bedside where he was sitting naked on his bed eating lunch. He was angry that he hadn't been given scrubs to wear after his shower. He reported the sensation that there is a snake in his throat, but he would not elaborate on this because he doesn't want to "play the mental card and get stuck in the hospital." He denied SI/HI/AVH. He reported that he was able to urinate after the foley catheter was pulled.   Objective:  Vital signs in last 24 hours: Vitals:   03/29/18 1727 03/29/18 2100 03/30/18 0458 03/30/18 0503  BP: 125/73 (!) 156/95 (!) 160/103 (!) 161/100  Pulse:  99 91 86  Resp: (!) 23 20 20 18   Temp:  98.3 F (36.8 C) 98.2 F (36.8 C)   TempSrc:  Oral Oral   SpO2:  97% 97% 99%  Weight:    76.3 kg  Height:       Physical Exam General: Alert and oriented x3.  HENT: Poor dentition. Normal pharynx. Cardio: RRR. No murmurs, rubs, or gallops. Pulm: CTAB. No wheezes, rales, or rhonchi. Ext: No lower extremity edema.  Assessment/Plan:  Principal Problem:   Acute exacerbation of CHF (congestive heart failure) (HCC) Active Problems:   Diabetes mellitus (HCC)   Cocaine use disorder, severe, dependence (HCC)   Urinary retention  [name] is a 57 yo M w/ a  PMHx notable as a person who frequently uses cocaine, HFrEF w/ GIII diastolic dysfunction which was recently diagnosed on an admission for which he left AMA who presented for severe dyspnea. He was started on treatment for acute on chronic combined heart failure exacerbation and bladder outlet obstruction.   Acute on chronic combined heart failure exacerbation: - Patient appears euvolemic on exam today with no need for further diuresis. He also has been tolerating PO well and no longer requires IV fluids. He was started on lisinopril 5mg  daily yesterday. To further optimize his heart failure, will begin aspirin, statin, and lasix. Will hold beta blocker for now because of his cocaine use. He is not eligible to get a 30-day supply of medication upon discharge due to his insurance plan. He has asked for paper prescriptions so he can fill them when he gets his disability check in a few days. Discussed with him the need to establish care with [facility] as soon as he can to continue getting these medications (only a 30 day supply was given) and have monitoring blood work while on the medications. Advised the patient to stop using crack cocaine to prevent worsening heart disease. Because he is adamant about leaving the hospital today, will not call cardiology for consult. Will discharge [name] home today so that he can get  his disability check and look for housing.  Plan - Stop IV fluids - Continue amlodipine 5mg  daily - Start aspirin 81 mg daily, atorvastatin 40mg  daily, and lasix 20mg  daily - Advise cocaine cessation and PCP establishment  Bladder outlet obstruction: - Ideally, Mr. Rash would keep the foley in place for 4 weeks for decompression given his severe BPH. However, [name] does not want the foley and it will not be manageable for him given his unhomed status. Will discontinue the foley today and attempt a voiding trial. After pulling the foley, Mr. Balaban  declined bladder scans and reported that he was able to urinate normally. He continues to have mild bleeding from the urethral meatus, which is likely the result of traumatic foley placement due to BPH.  Plan - Start Flomax 0.4mg  daily  Agitation - [name] is much more alert today, most likely because his ativan was discontinued. His agitation is most likely a combination of nicotine and cocaine withdrawal. Nursing was concerned about the patient hallucinating, but on exam, he denies SI/HI/AVH and appears to have good insight as to his medical condition and his surroundings. He does not require sedation at this time. Plan - Nicotine patch   Dispo: Anticipated discharge today.  [name], Cathleen Corti, MD 03/30/2018, 6:34 AM Pager: [phone]

## 2018-04-01 ENCOUNTER — Encounter (HOSPITAL_COMMUNITY): Payer: Self-pay | Admitting: Emergency Medicine

## 2018-04-01 ENCOUNTER — Emergency Department (HOSPITAL_COMMUNITY)
Admission: EM | Admit: 2018-04-01 | Discharge: 2018-04-01 | Disposition: A | Discharge status: Home / Self Care | Payer: Self-pay | Attending: Emergency Medicine | Admitting: Emergency Medicine

## 2018-04-01 ENCOUNTER — Other Ambulatory Visit: Payer: Self-pay

## 2018-04-01 ENCOUNTER — Emergency Department (HOSPITAL_COMMUNITY)
Admission: EM | Admit: 2018-04-01 | Discharge: 2018-04-01 | Disposition: A | Discharge status: Home / Self Care | Payer: Medicaid Other | Attending: Emergency Medicine | Admitting: Emergency Medicine

## 2018-04-01 DIAGNOSIS — I2510 Atherosclerotic heart disease of native coronary artery without angina pectoris: Secondary | ICD-10-CM | POA: Diagnosis not present

## 2018-04-01 DIAGNOSIS — F17210 Nicotine dependence, cigarettes, uncomplicated: Secondary | ICD-10-CM | POA: Diagnosis not present

## 2018-04-01 DIAGNOSIS — M7918 Myalgia, other site: Secondary | ICD-10-CM | POA: Insufficient documentation

## 2018-04-01 DIAGNOSIS — Z590 Homelessness: Secondary | ICD-10-CM | POA: Diagnosis not present

## 2018-04-01 DIAGNOSIS — J029 Acute pharyngitis, unspecified: Secondary | ICD-10-CM | POA: Insufficient documentation

## 2018-04-01 DIAGNOSIS — I110 Hypertensive heart disease with heart failure: Secondary | ICD-10-CM | POA: Insufficient documentation

## 2018-04-01 DIAGNOSIS — Z760 Encounter for issue of repeat prescription: Secondary | ICD-10-CM | POA: Insufficient documentation

## 2018-04-01 DIAGNOSIS — Z79899 Other long term (current) drug therapy: Secondary | ICD-10-CM | POA: Diagnosis not present

## 2018-04-01 DIAGNOSIS — E119 Type 2 diabetes mellitus without complications: Secondary | ICD-10-CM | POA: Insufficient documentation

## 2018-04-01 DIAGNOSIS — Z7982 Long term (current) use of aspirin: Secondary | ICD-10-CM | POA: Diagnosis not present

## 2018-04-01 DIAGNOSIS — Z5321 Procedure and treatment not carried out due to patient leaving prior to being seen by health care provider: Secondary | ICD-10-CM | POA: Insufficient documentation

## 2018-04-01 DIAGNOSIS — F1410 Cocaine abuse, uncomplicated: Secondary | ICD-10-CM | POA: Diagnosis not present

## 2018-04-01 DIAGNOSIS — F209 Schizophrenia, unspecified: Secondary | ICD-10-CM | POA: Diagnosis not present

## 2018-04-01 DIAGNOSIS — I509 Heart failure, unspecified: Secondary | ICD-10-CM | POA: Insufficient documentation

## 2018-04-01 MED ORDER — TAMSULOSIN HCL 0.4 MG PO CAPS
0.4000 mg | ORAL_CAPSULE | Freq: Once | ORAL | Status: AC
  Administered 2018-04-01: 0.4 mg via ORAL
  Filled 2018-04-01: qty 1

## 2018-04-01 MED ORDER — TAMSULOSIN HCL 0.4 MG PO CAPS: 0.4000 mg | ORAL_CAPSULE | Freq: Every day | ORAL | 0 refills | Status: AC

## 2018-04-01 MED ORDER — ACETAMINOPHEN 325 MG PO TABS
650.0000 mg | ORAL_TABLET | Freq: Once | ORAL | Status: AC
  Administered 2018-04-01: 650 mg via ORAL
  Filled 2018-04-01: qty 2

## 2018-04-01 MED ORDER — ACETAMINOPHEN 325 MG PO TABS: 650.0000 mg | ORAL_TABLET | Freq: Four times a day (QID) | ORAL | 0 refills | Status: DC | PRN

## 2018-04-01 NOTE — ED Triage Notes (Addendum)
Pt states he could not get medications, "I'm in pain all over."  Admits to crack use

## 2018-04-01 NOTE — ED Triage Notes (Signed)
Pt to triage via [facility] for "medication refill".  EMS reports pt is out of Flomax and needs refill.  Pt states he has Rx for Flomax but can't get it filled until he gets paid tomorrow or Monday.  Pt states he needs Tylenol, Flomax, and "nutrition" for today.  Requesting paper scrubs because he was incontinent of urine.

## 2018-04-01 NOTE — ED Provider Notes (Signed)
Patient placed in Quick Look pathway, seen and evaluated   Chief Complaint: medication refill  HPI: [name] is a 57 y.o. male who presents to the ED via [facility] for "medication refill".  EMS reports pt is out of Flomax and needs refill.  Pt states he has Rx for Flomax but can't get it filled until he gets paid tomorrow or Monday.  Pt states he needs Tylenol, Flomax, and "nutrition" for today.  Requesting paper scrubs because he was incontinent of urine.  ROS: GU: incontinent of urine   Physical Exam:  BP (!) 141/107 (BP Location: Right Arm)   Pulse 98   Temp 98.6 F (37 C) (Oral)   Resp 16   SpO2 97%    Gen: No distress  Neuro: Awake and Alert   Initiation of care has begun. The patient has been counseled on the process, plan, and necessity for staying for the completion/evaluation, and the remainder of the medical screening examination    Janne Napoleon, NP 04/01/18 1813    Pricilla Loveless, MD 04/01/18 2352

## 2018-04-01 NOTE — ED Provider Notes (Signed)
[facility] EMERGENCY DEPARTMENT Provider Note   CSN: [id number] Arrival date & time: 04/01/18  1751     History   Chief Complaint Chief Complaint  Patient presents with  . Medication Request    Request Tylenol, Flomax, and Nutrition    HPI [name] is a 57 y.o. male with a past medical history of CHF, polysubstance abuse, homelessness, hypertension, schizophrenia presents to ED requesting medication refill and "nutrition."  Patient states that he has been living on the streets and in jail for the past several months.  He does not have the funds to get his medication refilled.  He reports "hurting all over."  He states that he was given a refill of his Flomax after his hospital discharge 4 days ago.  However, he states that "I tore it up because I was paranoid."  Denies any SI, HI.  HPI  Past Medical History:  Diagnosis Date  . CHF (congestive heart failure) (HCC)   . Chronic foot pain   . Cocaine abuse (HCC)   . Diabetes mellitus without complication (HCC)   . Hepatitis C   . Homelessness   . Hypertension   . Neuropathy   . Polysubstance abuse (HCC)   . Schizophrenia [facility])     Patient Active Problem List   Diagnosis Date Noted  . Acute exacerbation of CHF (congestive heart failure) (HCC) 03/28/2018  . Acute on chronic heart failure (HCC) 03/16/2018  . Prostate enlargement 03/16/2018  . Aortic atherosclerosis (HCC) 03/16/2018  . 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 Cocaine abuse with cocaine-induced mood disorder (HCC) 09/18/2017  . Chronic foot pain   . Schizoaffective disorder, bipolar type (HCC) 09/30/2016  . Suicidal thoughts   . Substance induced mood disorder (HCC) 03/13/2015  . Acute kidney failure (HCC) 01/26/2015  . Schizophrenia, paranoid type (HCC) 01/17/2015  . Suicidal ideation   . Drug hallucinosis (HCC) 10/08/2014  . Chronic paranoid schizophrenia (HCC) 09/07/2014  . Substance or medication-induced bipolar  and related disorder with onset during intoxication (HCC) 08/10/2014  . Acute CHF (congestive heart failure) (HCC) 07/29/2014  . Urinary retention   . Cocaine use disorder, severe, dependence (HCC)   . Cocaine abuse (HCC) 03/10/2014  . Essential hypertension, benign 03/28/2013  . Diabetes mellitus (HCC) 03/15/2013    History reviewed. No pertinent surgical history.      Home Medications    Prior to Admission medications   Medication Sig Start Date End Date Taking? Authorizing Provider  acetaminophen (TYLENOL) 325 MG tablet Take 2 tablets (650 mg total) by mouth every 6 (six) hours as needed. 04/01/18   Jaser Fullen, PA-C  ARIPiprazole (ABILIFY) 5 MG tablet Take 5 mg by mouth daily.    [provider]  aspirin EC 81 MG tablet Take 1 tablet (81 mg total) by mouth daily. 03/30/18 03/30/19  Dorrell, Cathleen Corti, MD  atorvastatin (LIPITOR) 40 MG tablet Take 1 tablet (40 mg total) by mouth daily. 03/30/18   Dorrell, Cathleen Corti, MD  carbamazepine (TEGRETOL) 100 MG chewable tablet Chew 100 mg by mouth daily.    [provider]  furosemide (LASIX) 20 MG tablet Take 1 tablet (20 mg total) by mouth daily. 03/30/18 03/30/19  Dorrell, Cathleen Corti, MD  lisinopril (PRINIVIL,ZESTRIL) 5 MG tablet Take 1 tablet (5 mg total) by mouth daily. 03/31/18   Dorrell, Cathleen Corti, MD  tamsulosin (FLOMAX) 0.4 MG CAPS capsule Take 1 capsule (0.4 mg total) by mouth daily after supper for 15 days.  04/01/18 04/16/18  Dietrich Pates, PA-C    Family History Family History  Problem Relation Age of Onset  . Hypertension Other   . Diabetes Other     Social History Social History   Tobacco Use  . Smoking status: Current Every Day Smoker    Packs/day: 1.00    Years: 20.00    Pack years: 20.00    Types: Cigarettes  . Smokeless tobacco: Current User  Substance Use Topics  . Alcohol use: Yes    Comment: Daily Drinker   . Drug use: Yes    Frequency: 7.0 times per week    Types: "Crack" cocaine, Cocaine,  Marijuana    Comment: Cocaine tonight, Marijuana "a long time"     Allergies   Haldol [haloperidol]   Review of Systems Review of Systems  Constitutional: Negative for chills and fever.  Cardiovascular: Negative for chest pain.  Gastrointestinal: Negative for nausea and vomiting.  Musculoskeletal: Positive for myalgias.  Psychiatric/Behavioral: Negative for suicidal ideas.     Physical Exam Updated Vital Signs BP (!) 141/107 (BP Location: Right Arm)   Pulse 98   Temp 98.6 F (37 C) (Oral)   Resp 16   SpO2 97%   Physical Exam  Constitutional: He appears well-developed and well-nourished. No distress.  HENT:  Head: Normocephalic and atraumatic.  Eyes: Conjunctivae and EOM are normal. No scleral icterus.  Neck: Normal range of motion.  Pulmonary/Chest: Effort normal. No respiratory distress.  Neurological: He is alert.  Skin: No rash noted. He is not diaphoretic.  Psychiatric: He has a normal mood and affect.  Nursing note and vitals reviewed.    ED Treatments / Results  Labs (all labs ordered are listed, but only abnormal results are displayed) Labs Reviewed - No data to display  EKG None  Radiology No results found.  Procedures Procedures (including critical care time)  Medications Ordered in ED Medications  acetaminophen (TYLENOL) tablet 650 mg (650 mg Oral Given 04/01/18 1906)  tamsulosin (FLOMAX) capsule 0.4 mg (0.4 mg Oral Given 04/01/18 1907)     Initial Impression / Assessment and Plan / ED Course  I have reviewed the triage vital signs and the nursing notes.  Pertinent labs & imaging results that were available during my care of the patient were reviewed by me and considered in my medical decision making (see chart for details).     57 year old male presents to ED requesting medication refill and food.  He seen numerous times in the ED in the past 6 months for similar symptoms.  He states that he was paranoid and he tore up the prescription  refill of Flomax that was given to him after his discharge from the hospital yesterday.  Denies any current symptoms except for "hurting all over."  Denies any SI, HI.  Patient given a dose of his Tylenol, Flomax here.  Will discharge home with refills.  Will advise him to follow-up with his primary care provider and to return to ED for any severe worsening symptoms.  Portions of this note were generated with Scientist, clinical (histocompatibility and immunogenetics). Dictation errors may occur despite best attempts at proofreading.   Final Clinical Impressions(s) / ED Diagnoses   Final diagnoses:  Encounter for medication refill    ED Discharge Orders         Ordered    acetaminophen (TYLENOL) 325 MG tablet  Every 6 hours PRN     04/01/18 1909    tamsulosin (FLOMAX) 0.4 MG CAPS capsule  Daily after  supper     04/01/18 1909           Dietrich Pates, PA-C 04/01/18 Harle Stanford, MD 04/04/18 2154

## 2018-04-01 NOTE — ED Notes (Addendum)
Pt called twice with no response 

## 2018-04-01 NOTE — ED Notes (Signed)
Pt given blue paper scrubs.

## 2018-04-01 NOTE — Discharge Instructions (Signed)
He will need to follow-up with her primary care provider for further evaluation. Return to ED for worsening symptoms, chest pain or abdominal pain, vomiting or coughing up blood, numbness in arms or legs.

## 2018-04-05 IMAGING — CT CT HEAD W/O CM
3 series · 15 of 47 positions shown, 18 images · non-contrast
Comparison: Head CT without contrast 01/27/2017 and earlier.

CLINICAL DATA: 56-year-old male with altered level of
consciousness, difficult to arouse.

EXAM:
CT HEAD WITHOUT CONTRAST
TECHNIQUE: Contiguous axial images were obtained from the base of the skull
through the vertex without intravenous contrast.

[Series 2: head wo · axial · 0.47mm/px · z∈[-34,+91]mm · 9 of 31 slices shown, 12 images]
[im 3/31  brain]
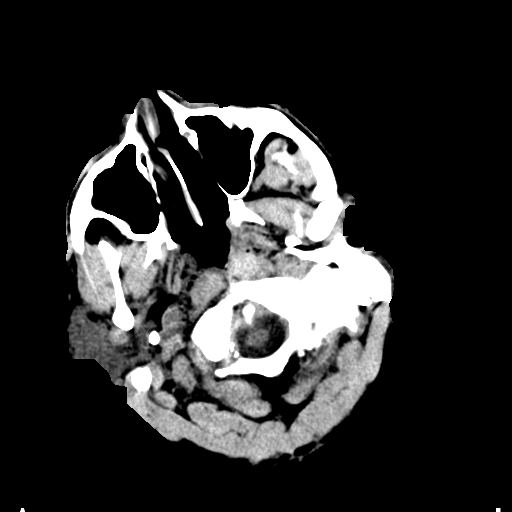
[im 3/31  bone]
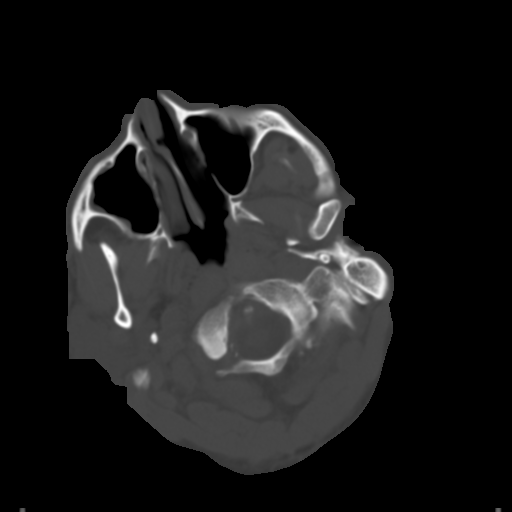
[im 6/31  brain]
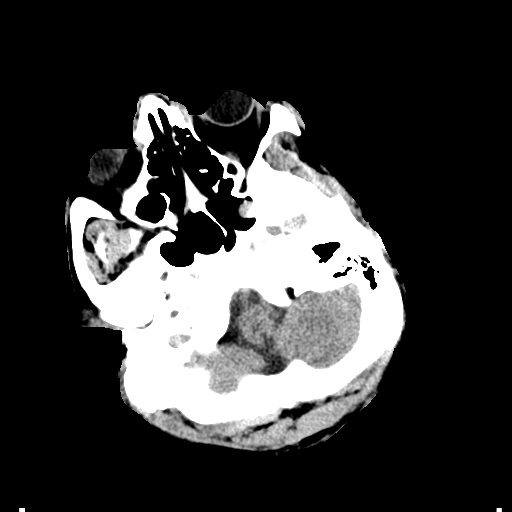
[im 9/31  brain]
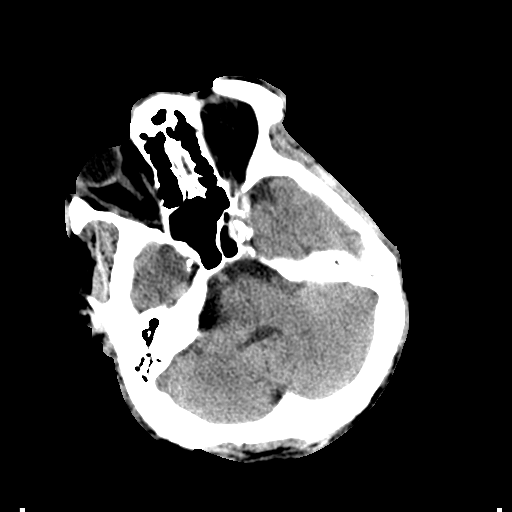
[im 12/31  brain]
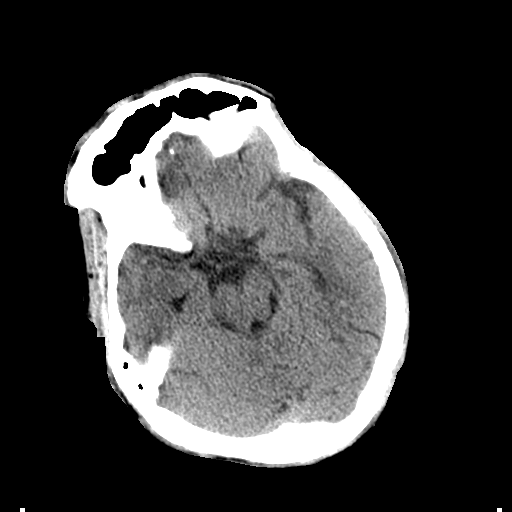
[im 16/31  brain]
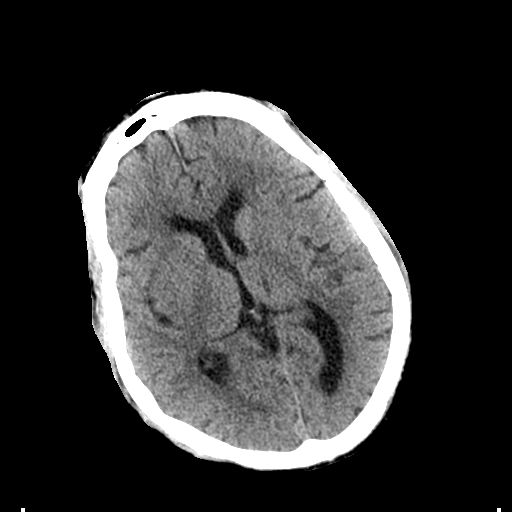
[im 16/31  bone]
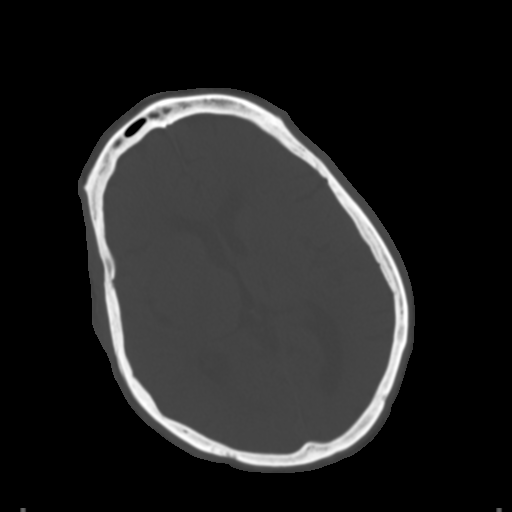
[im 19/31  brain]
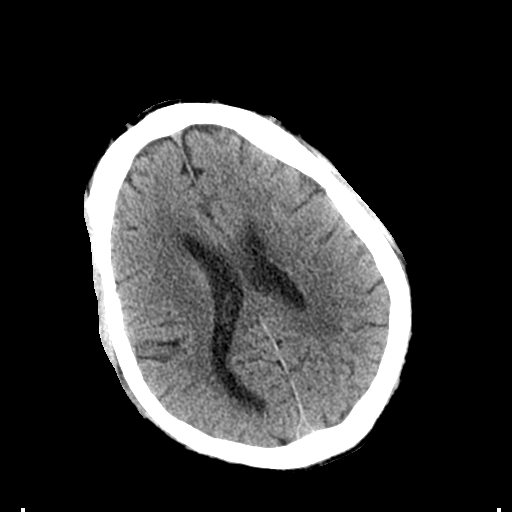
[im 22/31  brain]
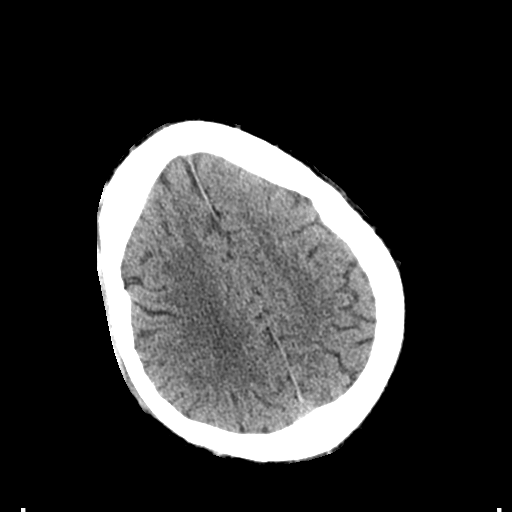
[im 25/31  brain]
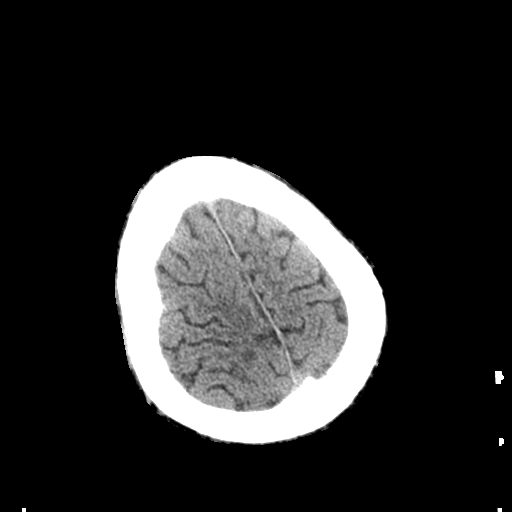
[im 28/31  brain]
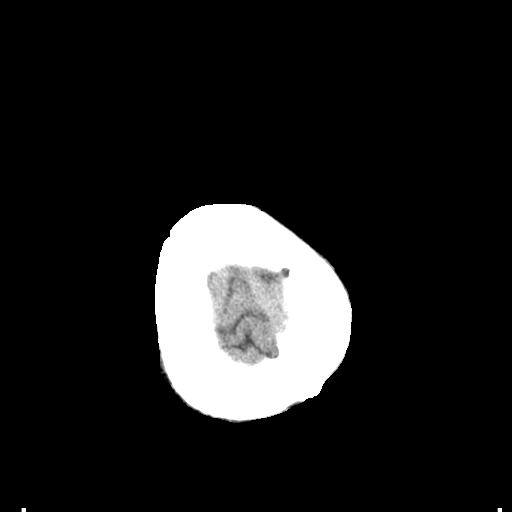
[im 28/31  bone]
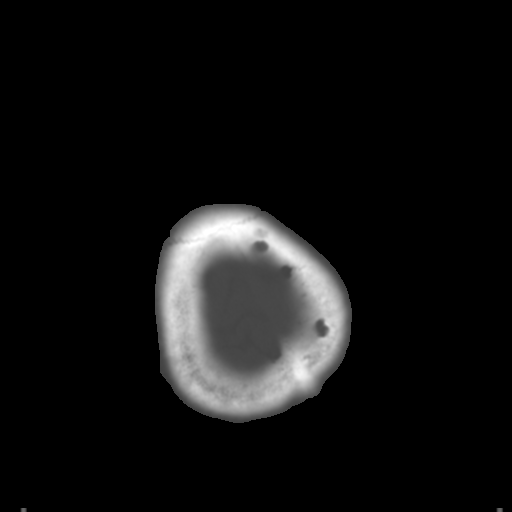

[Series 4: coronal soft tissue · coronal · 0.30mm/px · 3 of 65 slices shown]
[im 22/65  brain]
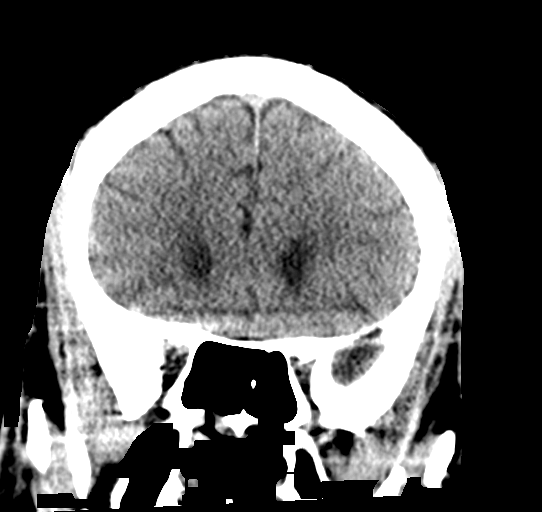
[im 29/65  brain]
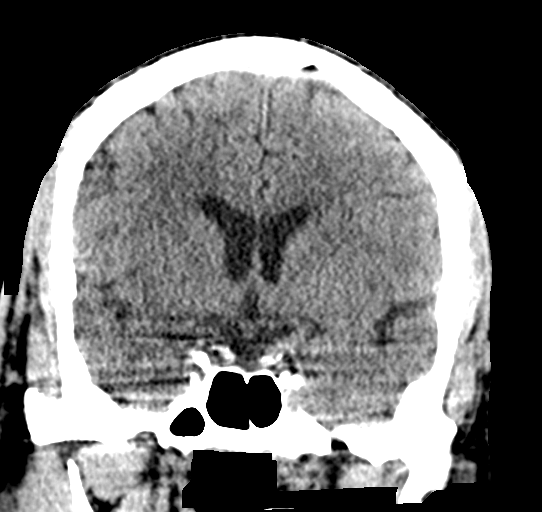
[im 36/65  brain]
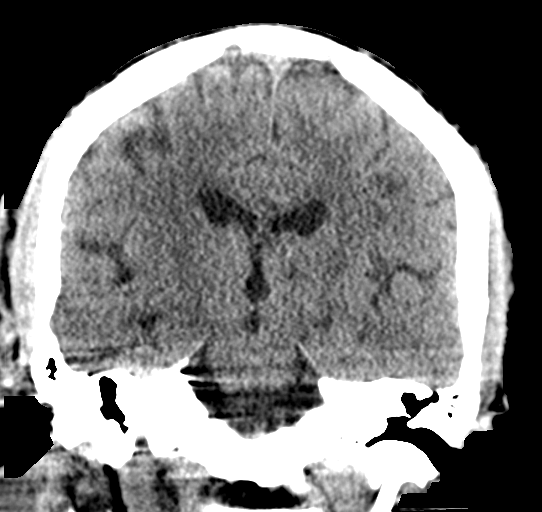

[Series 5: sagittal soft tissue · sagittal · 0.30mm/px · 3 of 51 slices shown]
[im 17/51  brain]
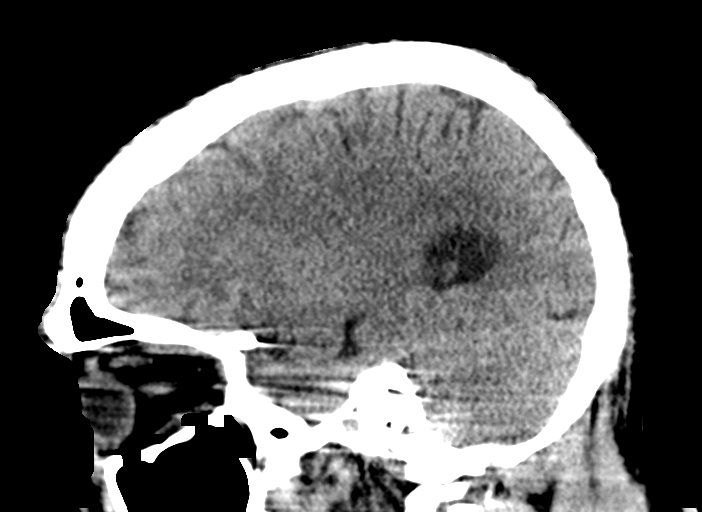
[im 26/51  brain]
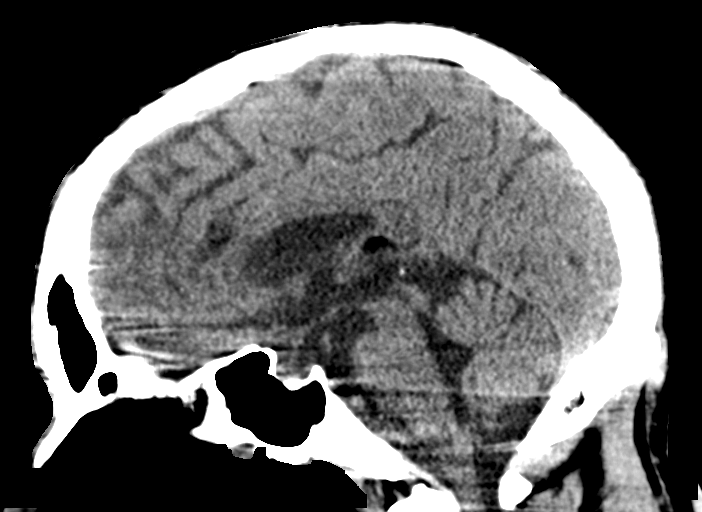
[im 34/51  brain]
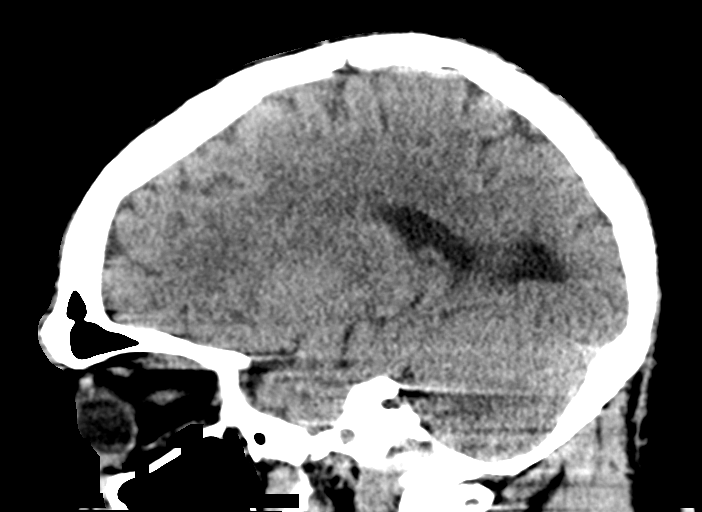

[15 of 47 positions shown; findings below may reference images not displayed]

FINDINGS: Brain: A small midline lipoma is redemonstrated along the
posterosuperior 3rd ventricle and inconsequential (congenital normal
variant). Cerebral volume is stable and within normal limits for
age. No midline shift, ventriculomegaly, mass effect, evidence of
mass lesion, intracranial hemorrhage or evidence of cortically based
acute infarction. Possible small chronic lacunar infarct in the left
thalamus is stable since 7970 (series 2, image 16). Otherwise
gray-white matter differentiation is within normal limits throughout
the brain.

Vascular: Calcified atherosclerosis at the skull base. No suspicious
intracranial vascular hyperdensity.

Skull: Stable.  No acute osseous abnormality identified.

Sinuses/Orbits: Stable scattered mild paranasal sinus mucosal
thickening and occasional small mucous retention cysts. The
bilateral tympanic cavities and mastoids appear stable and well
pneumatized.

Other: No acute orbit or scalp soft tissue finding.
IMPRESSION: Stable and negative for age noncontrast CT appearance of the brain.

## 2018-04-07 IMAGING — CT CT ABD-PELV W/O CM
2 of 4 series · 15 of 46 positions shown, 17 images · non-contrast
Comparison: CT abdomen and pelvis December 18, 2008

CLINICAL DATA: Abdominal pain tracking into penis region.
Hepatitis-C.

EXAM:
CT ABDOMEN AND PELVIS WITHOUT CONTRAST
TECHNIQUE: Multidetector CT imaging of the abdomen and pelvis was performed
following the standard protocol without oral or IV contrast.

[Series 2: axial st · axial · 0.71mm/px · z∈[-462,-62]mm · 12 of 88 slices shown, 14 images]
[im 4/88  soft-tissue]
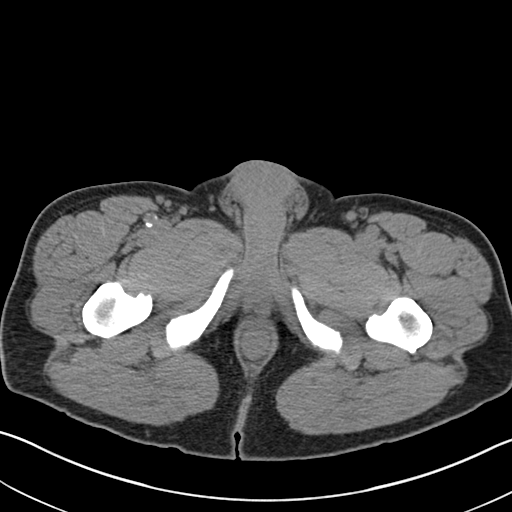
[im 4/88  bone]
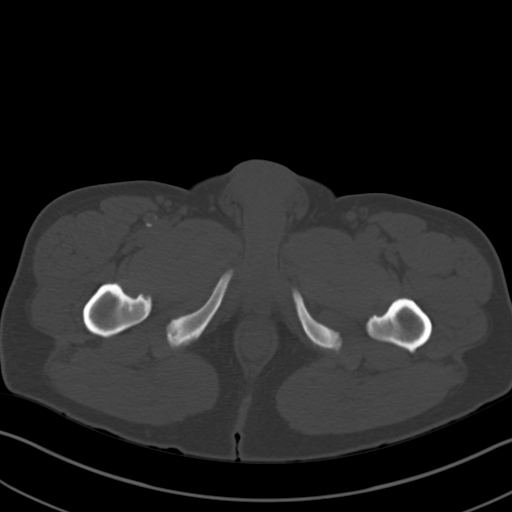
[im 12/88  soft-tissue]
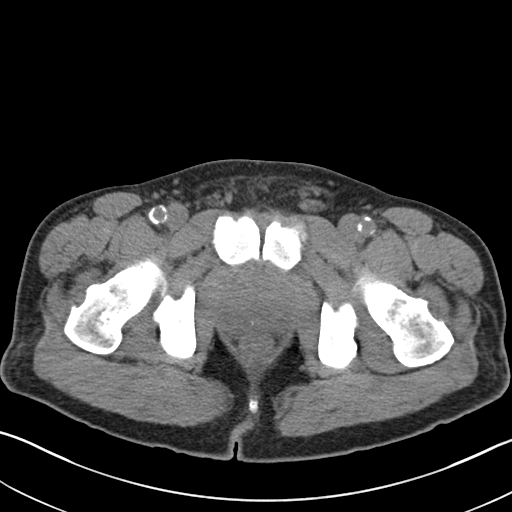
[im 19/88  soft-tissue]
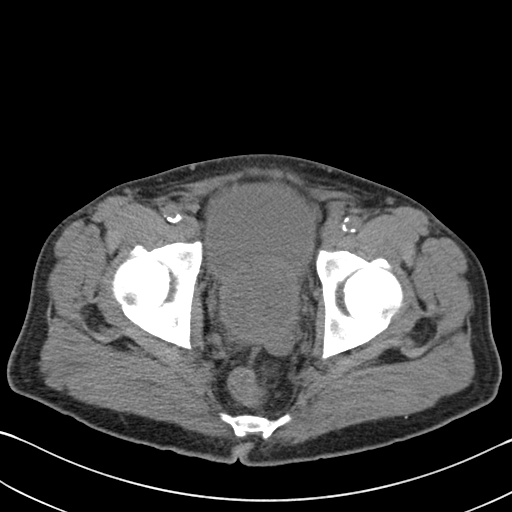
[im 27/88  soft-tissue]
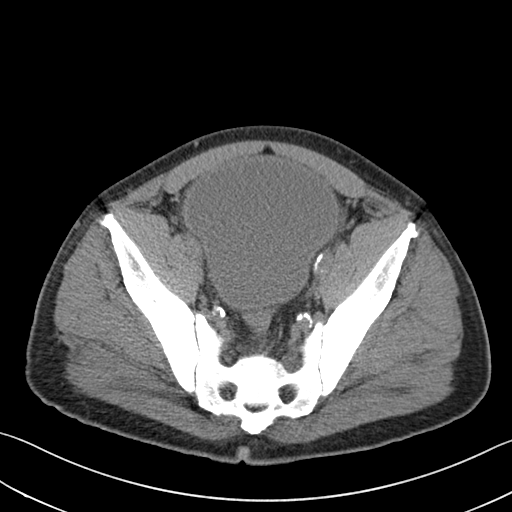
[im 35/88  soft-tissue]
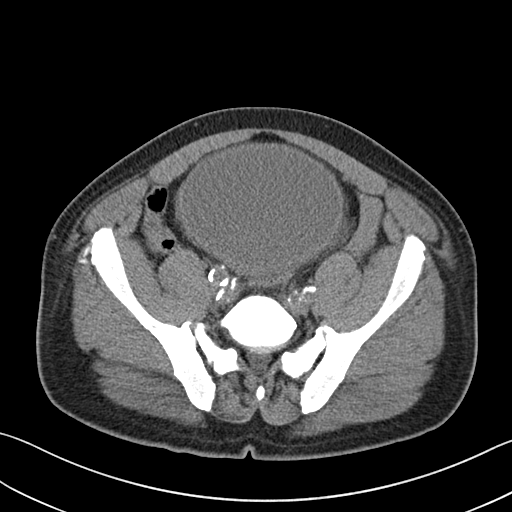
[im 42/88  soft-tissue]
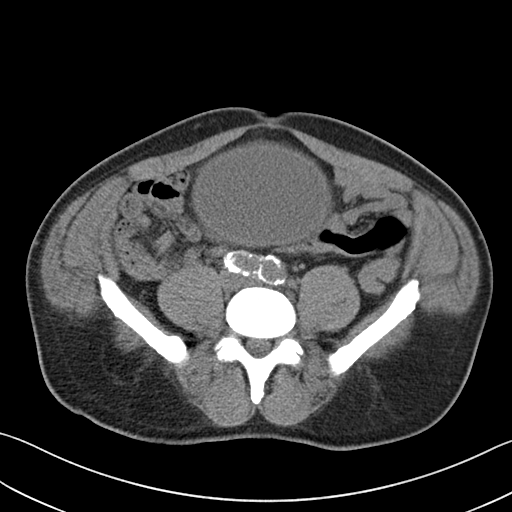
[im 46/88  soft-tissue]
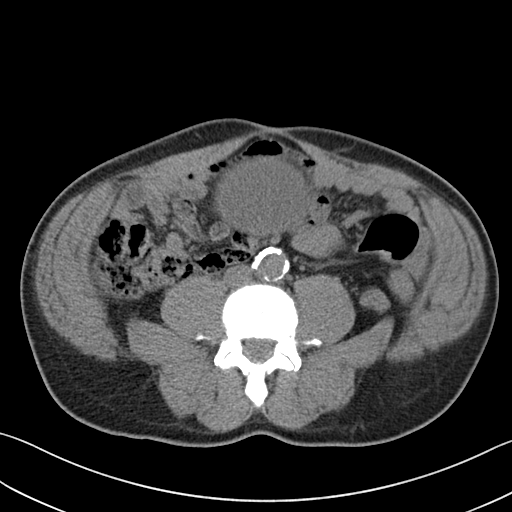
[im 53/88  soft-tissue]
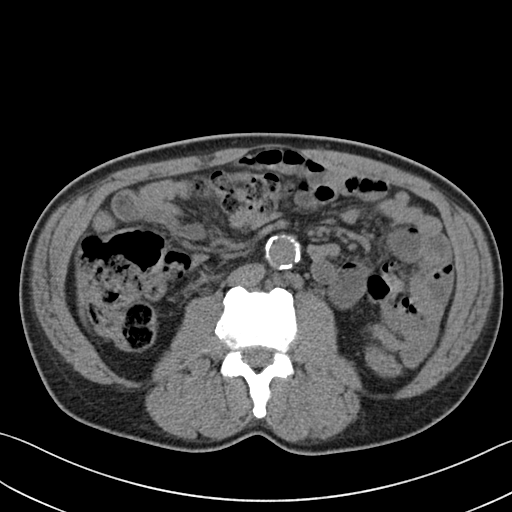
[im 61/88  soft-tissue]
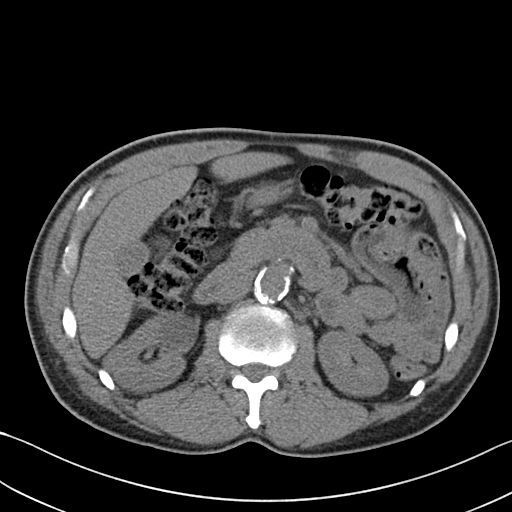
[im 61/88  bone]
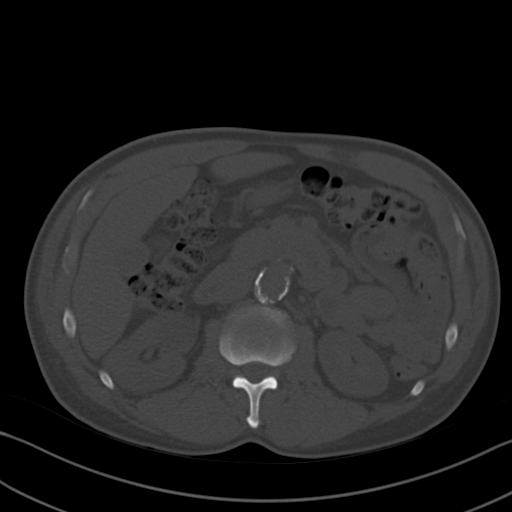
[im 69/88  soft-tissue]
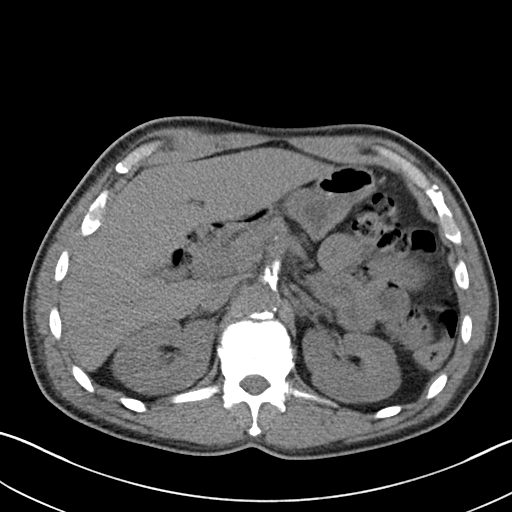
[im 76/88  soft-tissue]
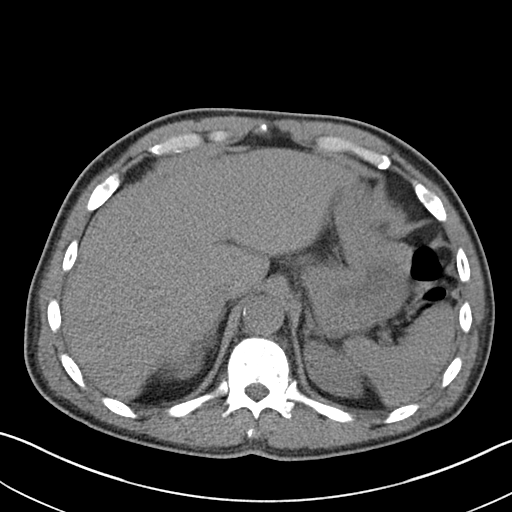
[im 84/88  soft-tissue]
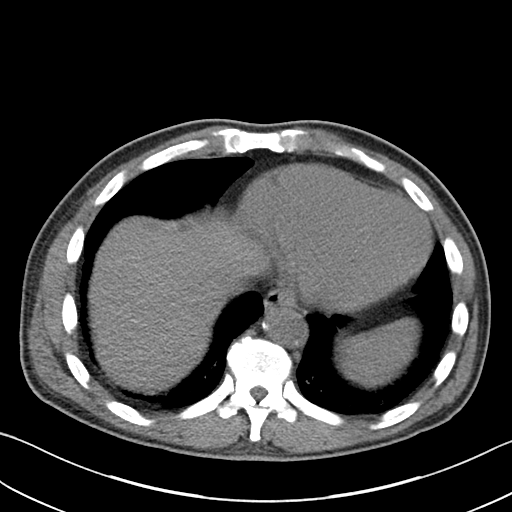

[Series 5: coronal st · coronal · 0.65mm/px · 3 of 82 slices shown]
[im 28/82  soft-tissue]
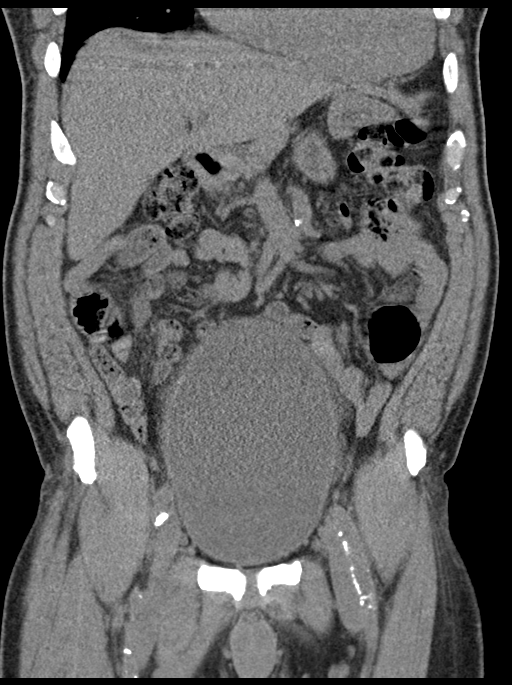
[im 37/82  soft-tissue]
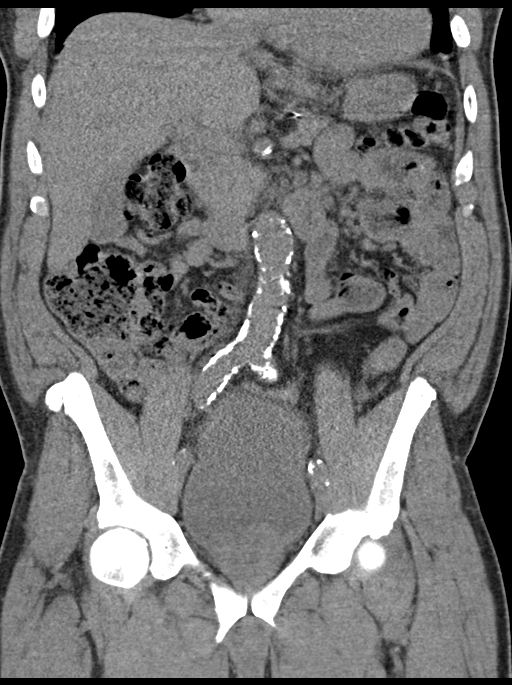
[im 46/82  soft-tissue]
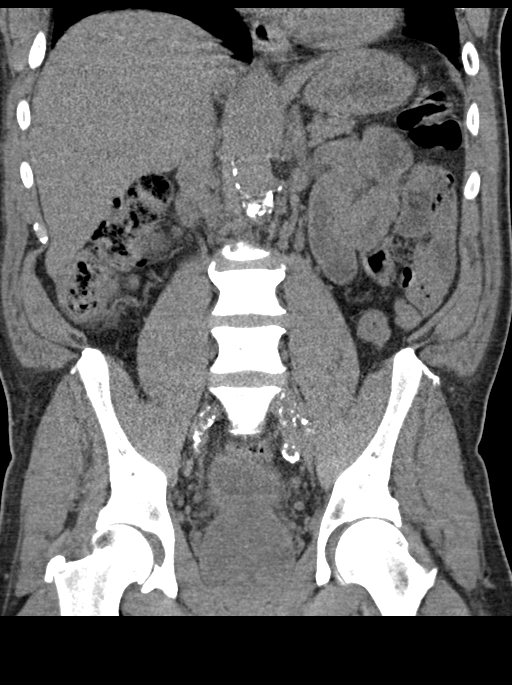

[15 of 46 positions shown; findings below may reference images not displayed]

FINDINGS: Lower chest: There is fibrosis in portions of the lung bases.

Hepatobiliary: No focal liver lesions are appreciable on this
noncontrast enhanced study. Gallbladder wall is not appreciably
thickened. There is no biliary duct dilatation.

Pancreas: There is no pancreatic mass or inflammatory focus.

Spleen: No splenic lesions are evident.

Adrenals/Urinary Tract: Adrenals appear normal bilaterally. There is
a cyst arising from the anterior right kidney measuring 1.8 x
cm. A cyst arising from the anterior mid kidney on the right
measures 4.0 x 3.8 cm. There is no hydronephrosis on either side.
There is no renal or ureteral calculus on either side.

The urinary bladder is distended. Urinary bladder wall thickness is
within normal limits.

Stomach/Bowel: There is no appreciable bowel wall or mesenteric
thickening. No evident bowel obstruction. No free air or portal
venous air.

Vascular/Lymphatic: There is a atherosclerotic calcification
throughout the aorta and the common iliac arteries. There is also
internal and external iliac and common femoral artery calcification
bilaterally. There is mild dilatation of the distal abdominal aorta
with a maximum transverse diameter of 3.4 x 2.9 cm. There is no
periaortic fluid. There is atherosclerotic calcification in the
major mesenteric arterial vessels without apparent obstruction of
the major mesenteric arterial vessels on noncontrast enhanced study.

No adenopathy is evident in the abdomen or pelvis.

Reproductive: Prostate is diffusely enlarged. Prostate abuts the
inferior aspect of the urinary bladder. Seminal vesicles appear
normal. No evident pelvic mass.

Other: Appendix appears normal. No abscess or ascites is evident in
the abdomen or pelvis. A metallic foreign body is noted anterior to
the liver at the thoracic-abdominal junction. There is fat in each
inguinal ring.

Musculoskeletal: There is degenerative change in the lower thoracic
and lumbar spine regions. No blastic or lytic bone lesions. No
intramuscular or abdominal wall lesion evident.
IMPRESSION: 1. Diffusely enlarged prostate impressing upon the inferior aspect
of the urinary bladder. Urinary bladder is distended. Suspect a
degree of bladder outlet obstruction due to the enlarged prostate.

2.  No hydronephrosis.  No renal or ureteral calculus evident.

3. Extensive aortic and pelvic arterial vascular atherosclerosis as
well as atherosclerotic change in multiple mesenteric arterial
vessels. Dilatation of the distal abdominal aorta with a measured
transverse diameter 3.4 x 2.9 cm. Recommend followup by ultrasound
in 3 years. This recommendation follows ACR consensus guidelines:
White Paper of the ACR Incidental Findings Committee II on Vascular
Findings. [HOSPITAL] 6728; [DATE].

4.  No bowel obstruction.  No abscess.  Appendix appears normal.

Aortic Atherosclerosis ([id number]-[id number]).

## 2018-04-16 IMAGING — DX DG CHEST 2V
2 series · 2 of 2 positions shown · non-contrast
Comparison: 03/24/2017

CLINICAL DATA: Cough and congestion

EXAM:
CHEST - 2 VIEW

[chest pa]
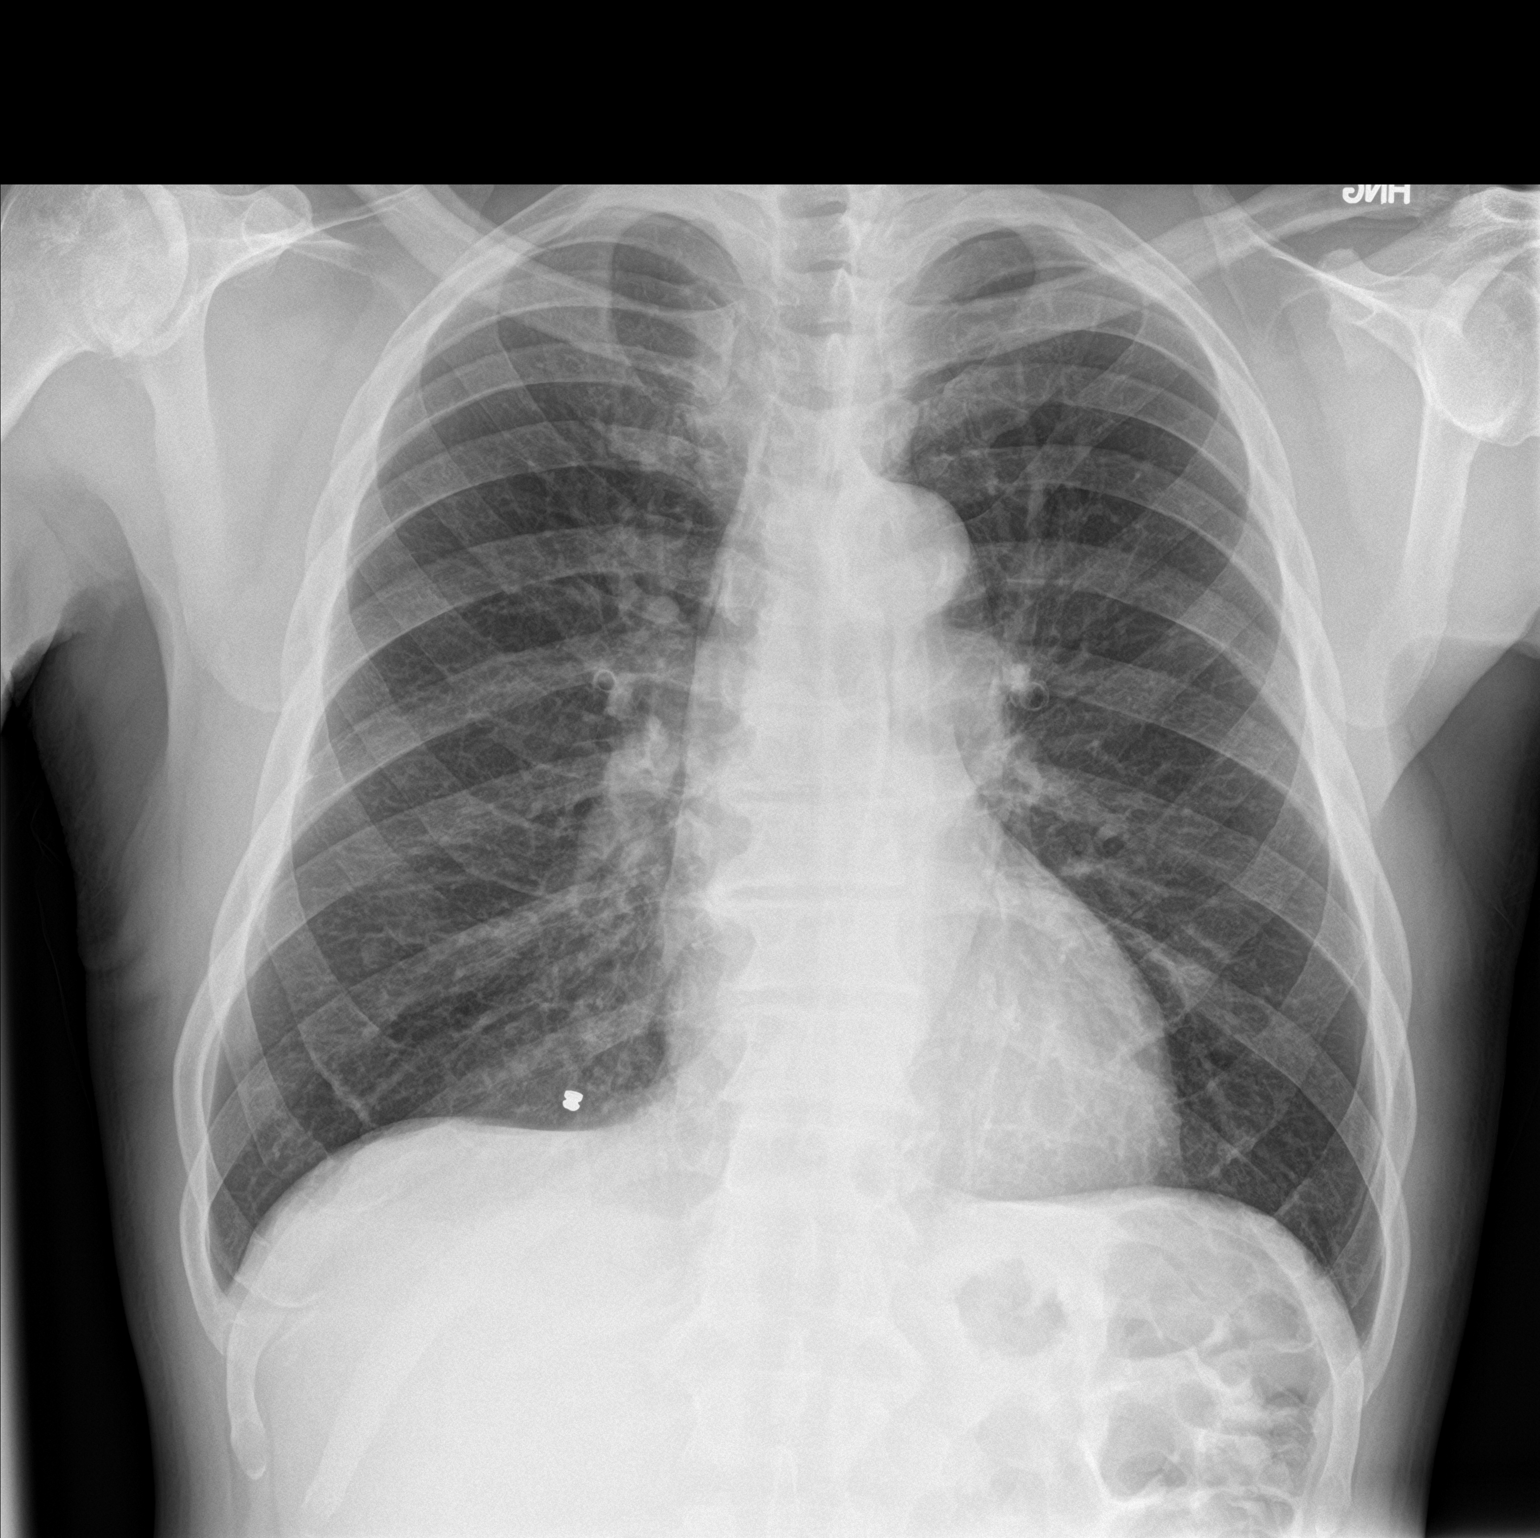

[chest lat]
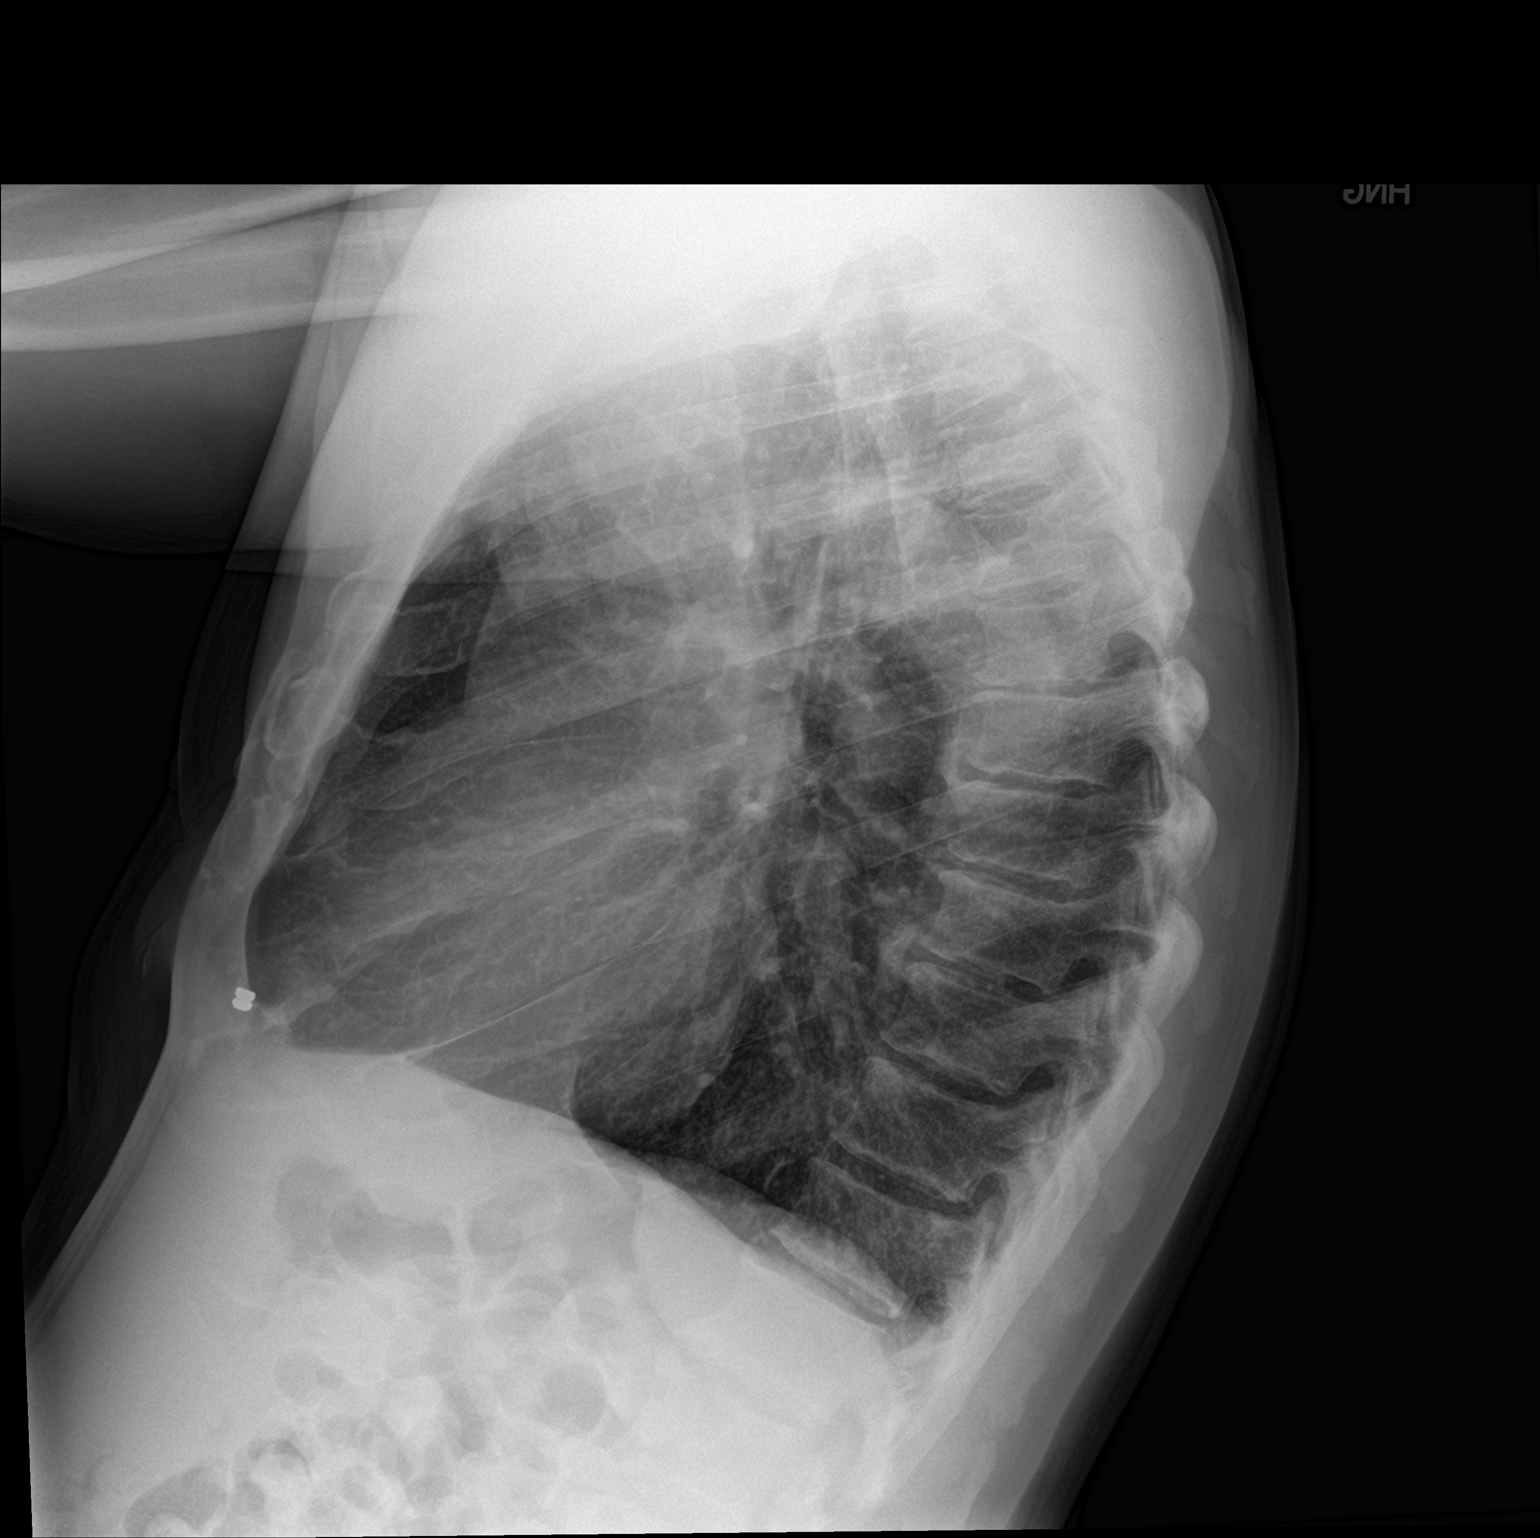

[2 of 2 positions shown; findings below may reference images not displayed]

FINDINGS: Hyperinflation. No focal pulmonary infiltrate or effusion. Stable
cardiomediastinal silhouette with aortic atherosclerosis. No
pneumothorax.
IMPRESSION: No active cardiopulmonary disease.

## 2018-06-29 IMAGING — CR DG CHEST 2V
2 series · 2 of 2 positions shown · non-contrast
Comparison: 10/03/2017

CLINICAL DATA: Chest pain and bilateral foot pain after smoking
crack. Incontinent of bowel and bladder.

EXAM:
CHEST - 2 VIEW

[chest pa]
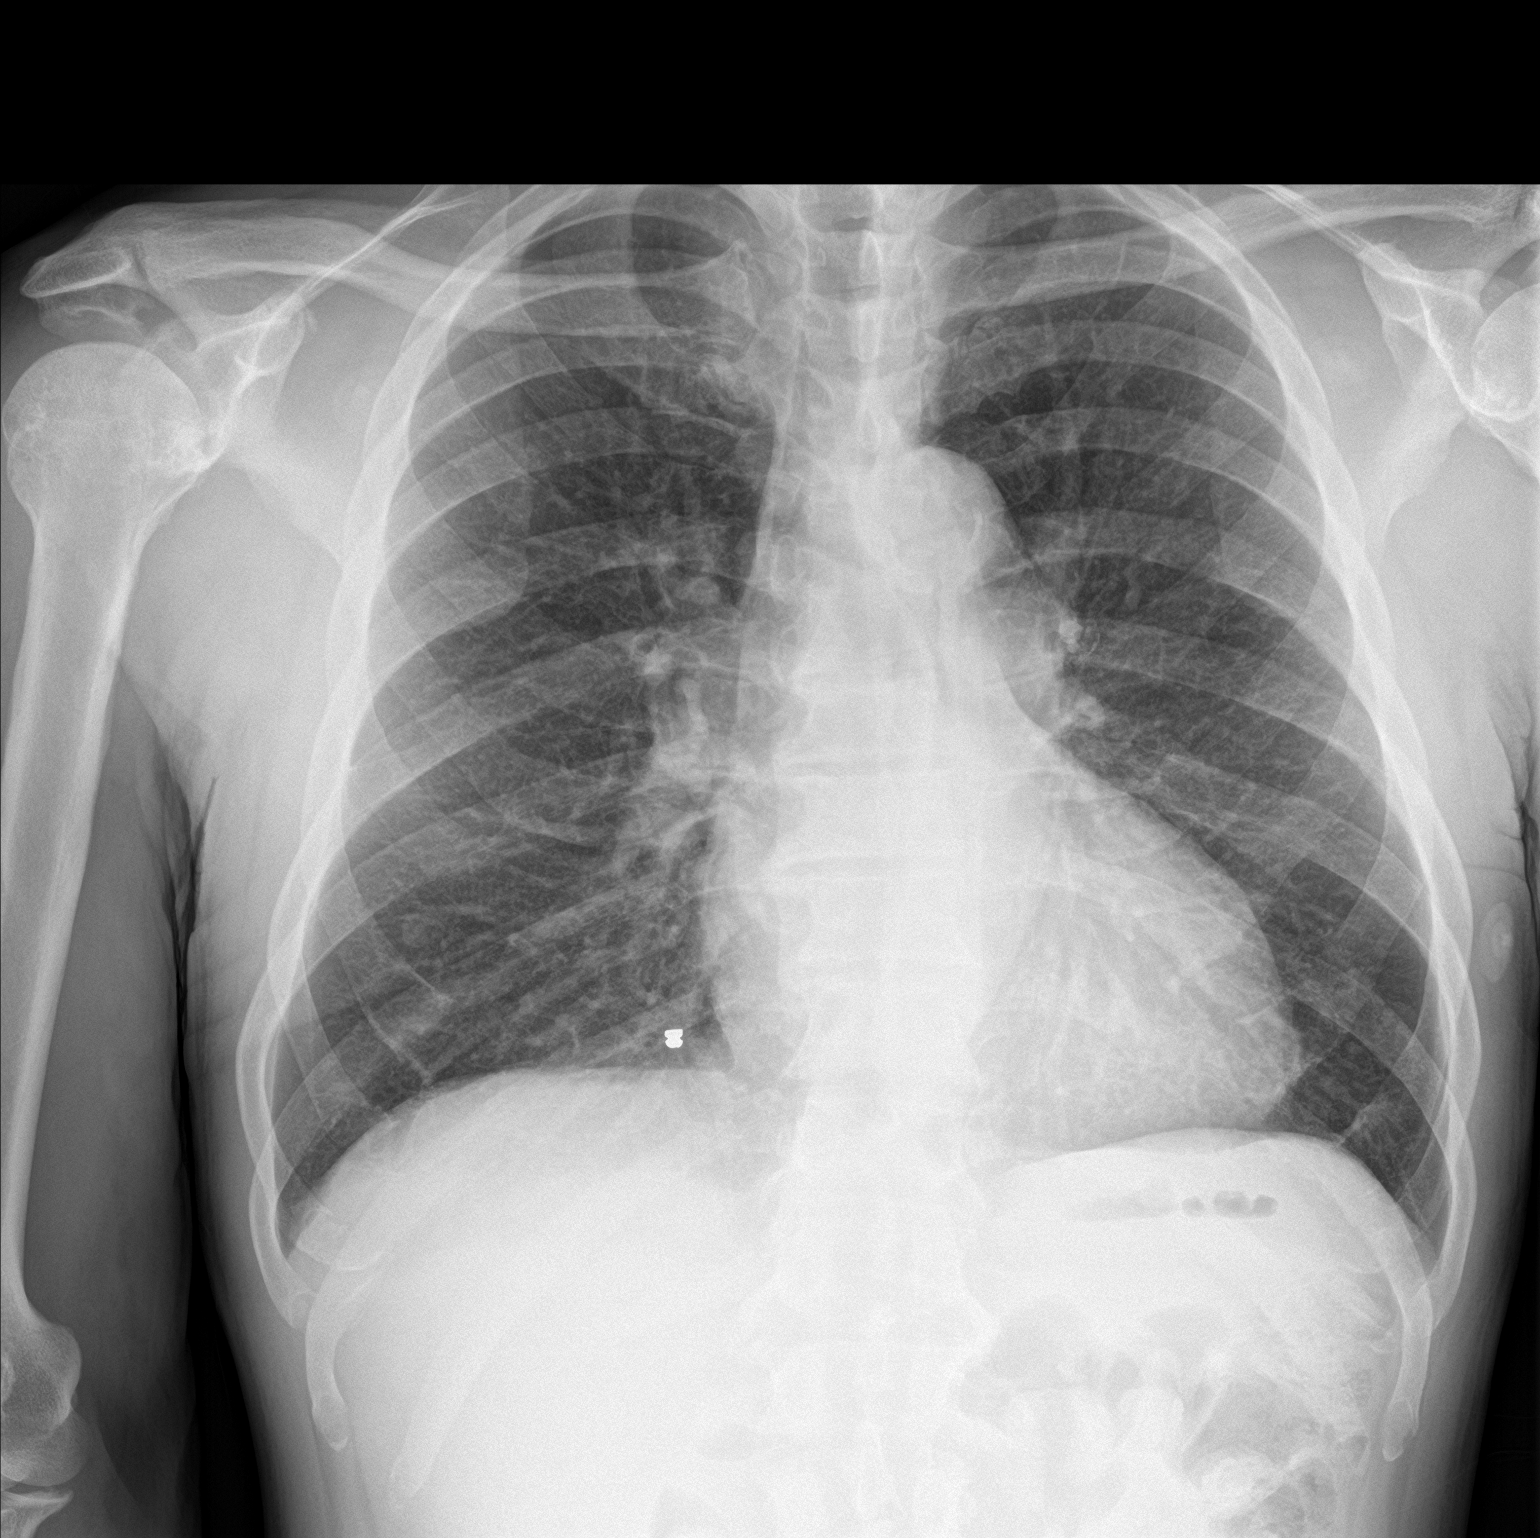

[chest lat]
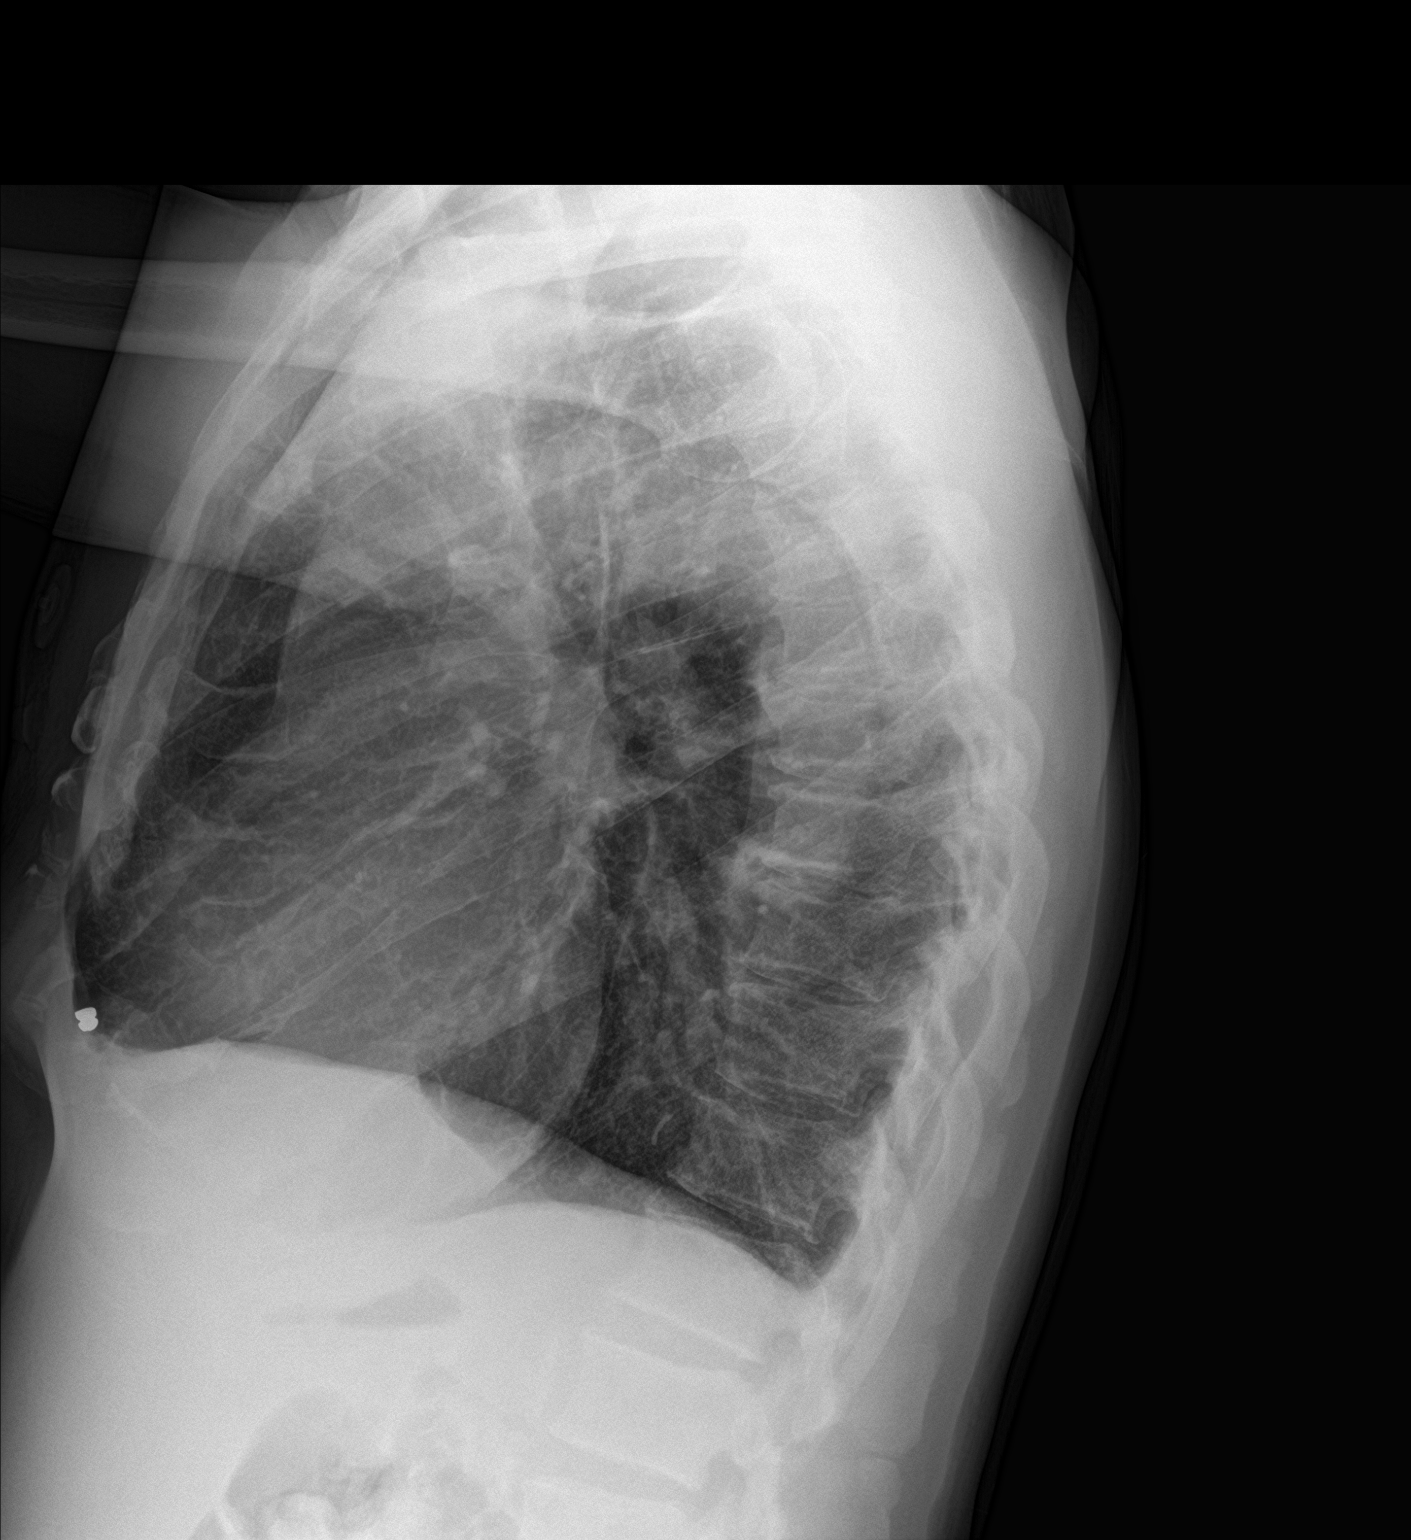

[2 of 2 positions shown; findings below may reference images not displayed]

FINDINGS: Heart size and pulmonary vascularity are normal. Metallic pellet in
the anterior chest projected over the right costophrenic angle. No
airspace disease or consolidation. No blunting of costophrenic
angles. No pneumothorax. Calcified and tortuous aorta. Degenerative
changes in the spine. Degenerative changes in the shoulders. No
significant change since previous study.
IMPRESSION: No active cardiopulmonary disease.  Aortic atherosclerosis.

## 2018-07-02 IMAGING — CR DG CHEST 2V
2 series · 2 of 2 positions shown · non-contrast
Comparison: 12/16/2017, 10/03/2017

CLINICAL DATA: Chest pain

EXAM:
CHEST - 2 VIEW

[chest pa]
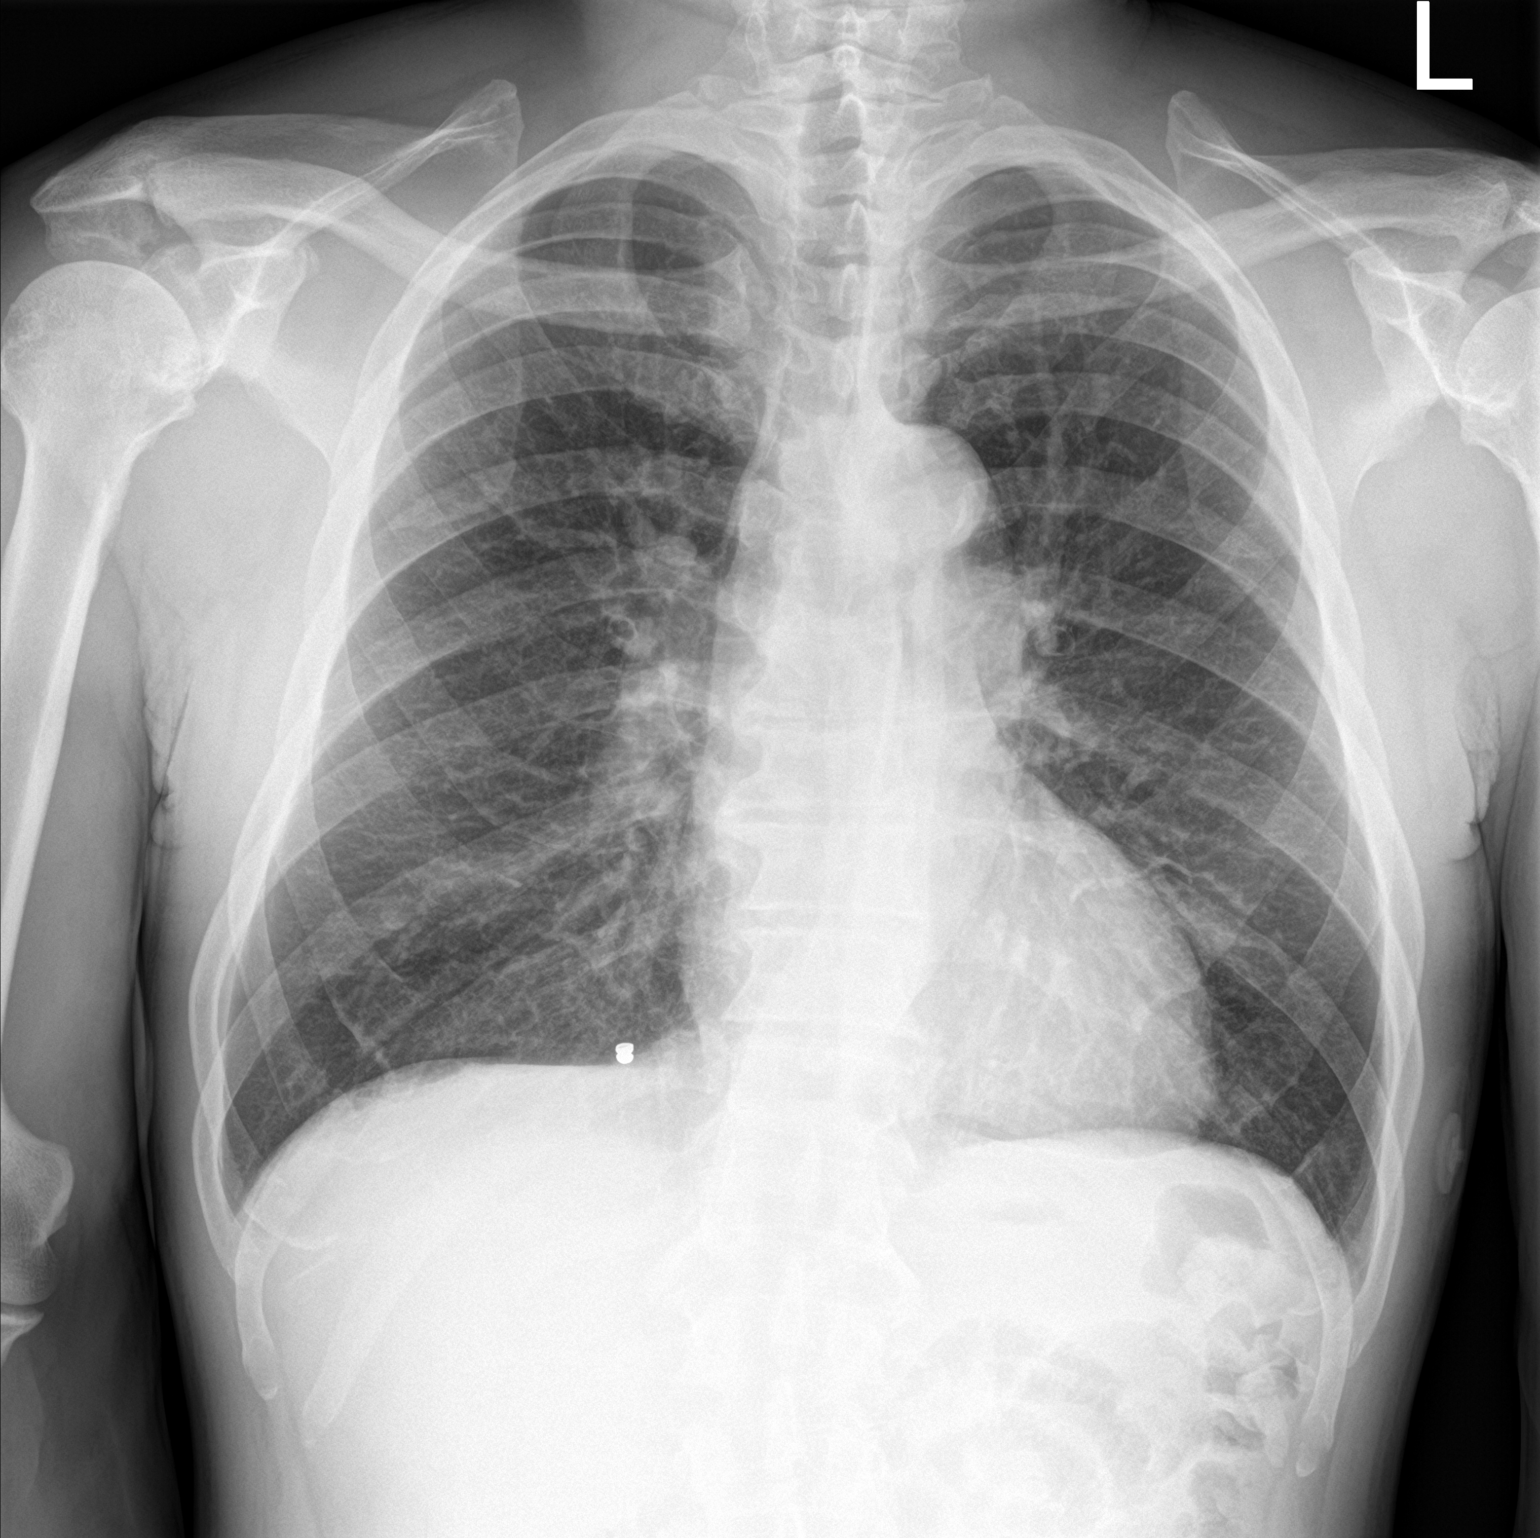

[chest lat]
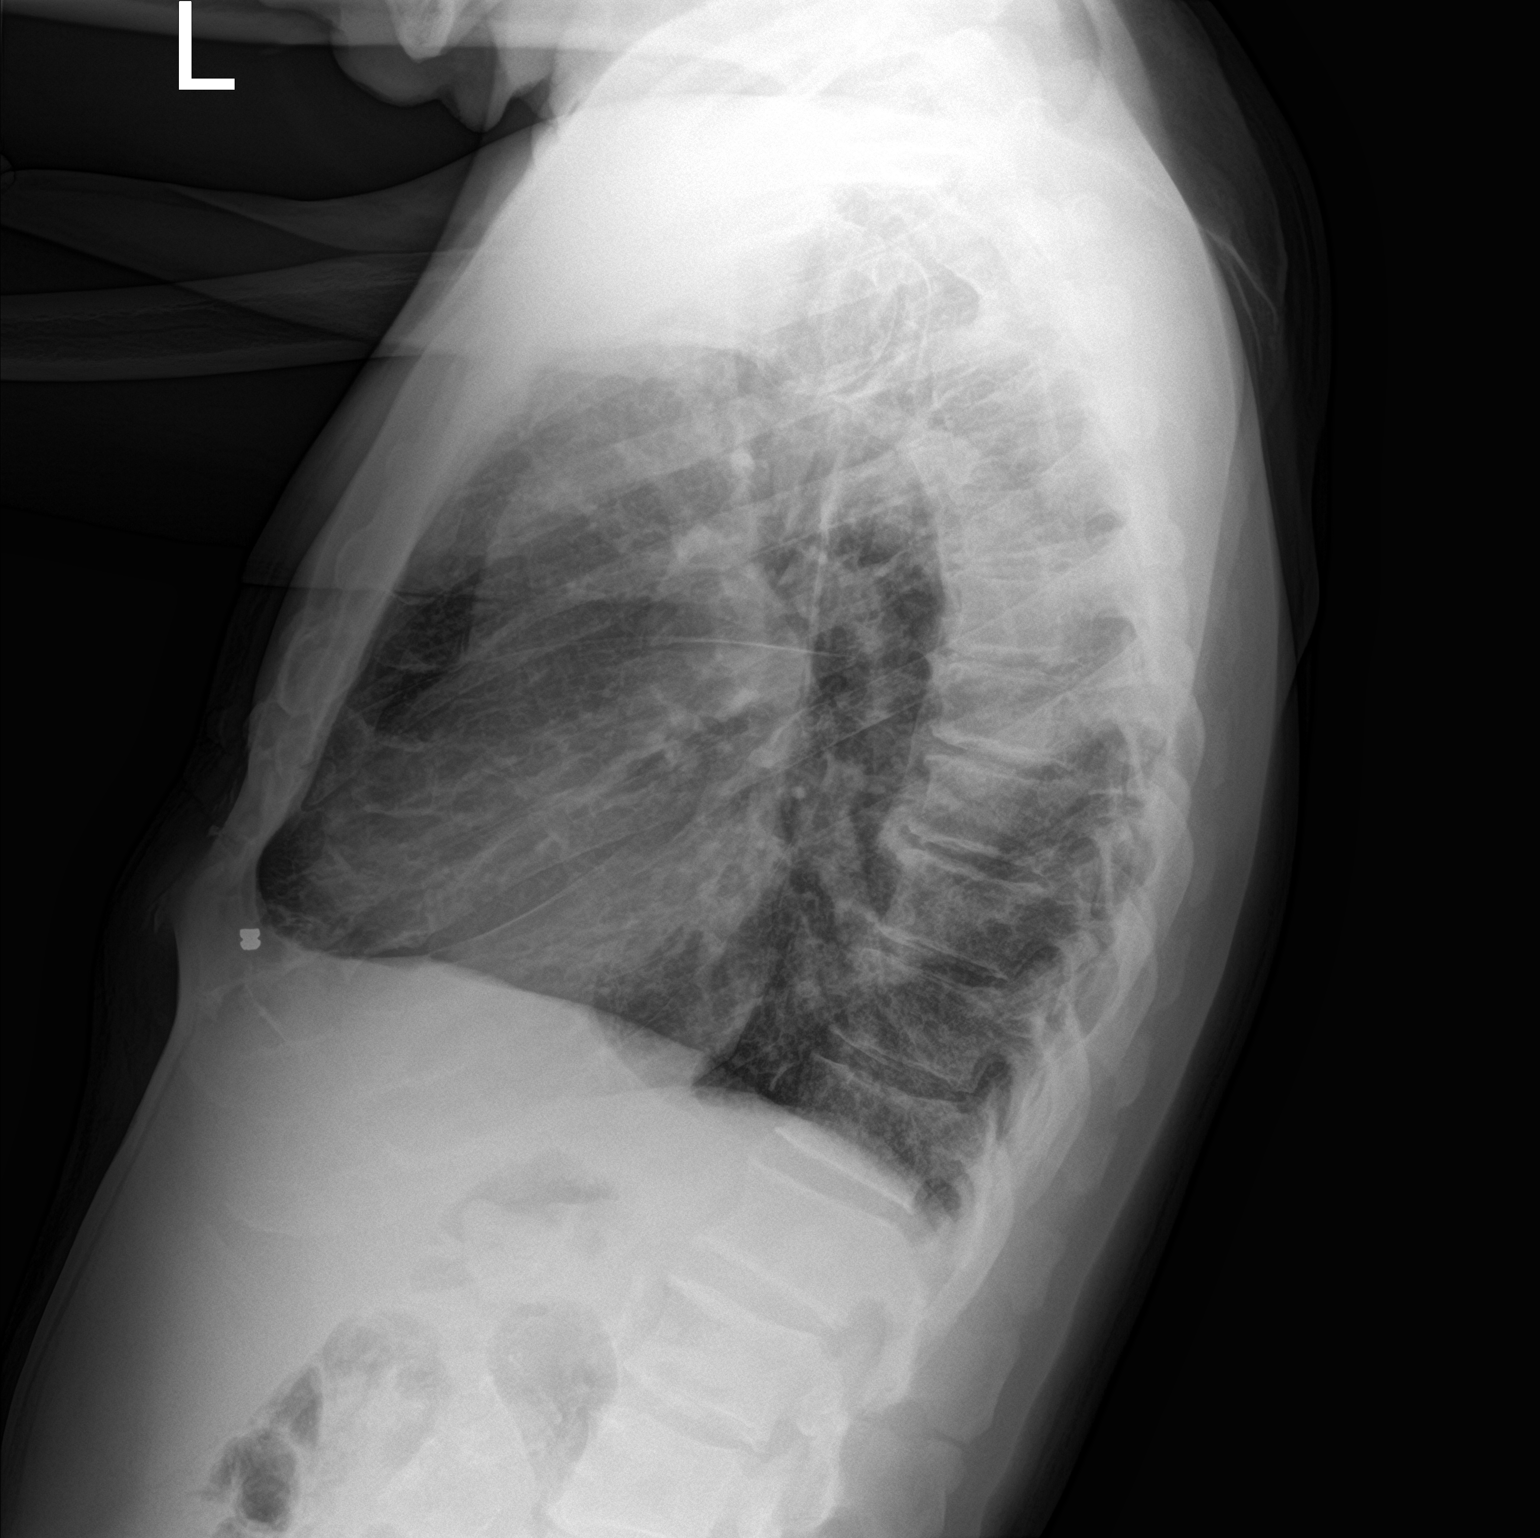

[2 of 2 positions shown; findings below may reference images not displayed]

FINDINGS: No acute airspace disease or pleural effusion. Normal heart size.
Aortic atherosclerosis. No pneumothorax. Metallic opacity over the
right anterior lower chest wall.
IMPRESSION: No active cardiopulmonary disease.

## 2018-07-10 IMAGING — DX DG FOOT COMPLETE 3+V*R*
3 series · 3 of 3 positions shown · non-contrast
Comparison: Right foot radiograph dated 12/14/2014

CLINICAL DATA: 56-year-old male with right foot pain after walking.
No known trauma.

EXAM:
RIGHT FOOT COMPLETE - 3+ VIEW

[foot ap]
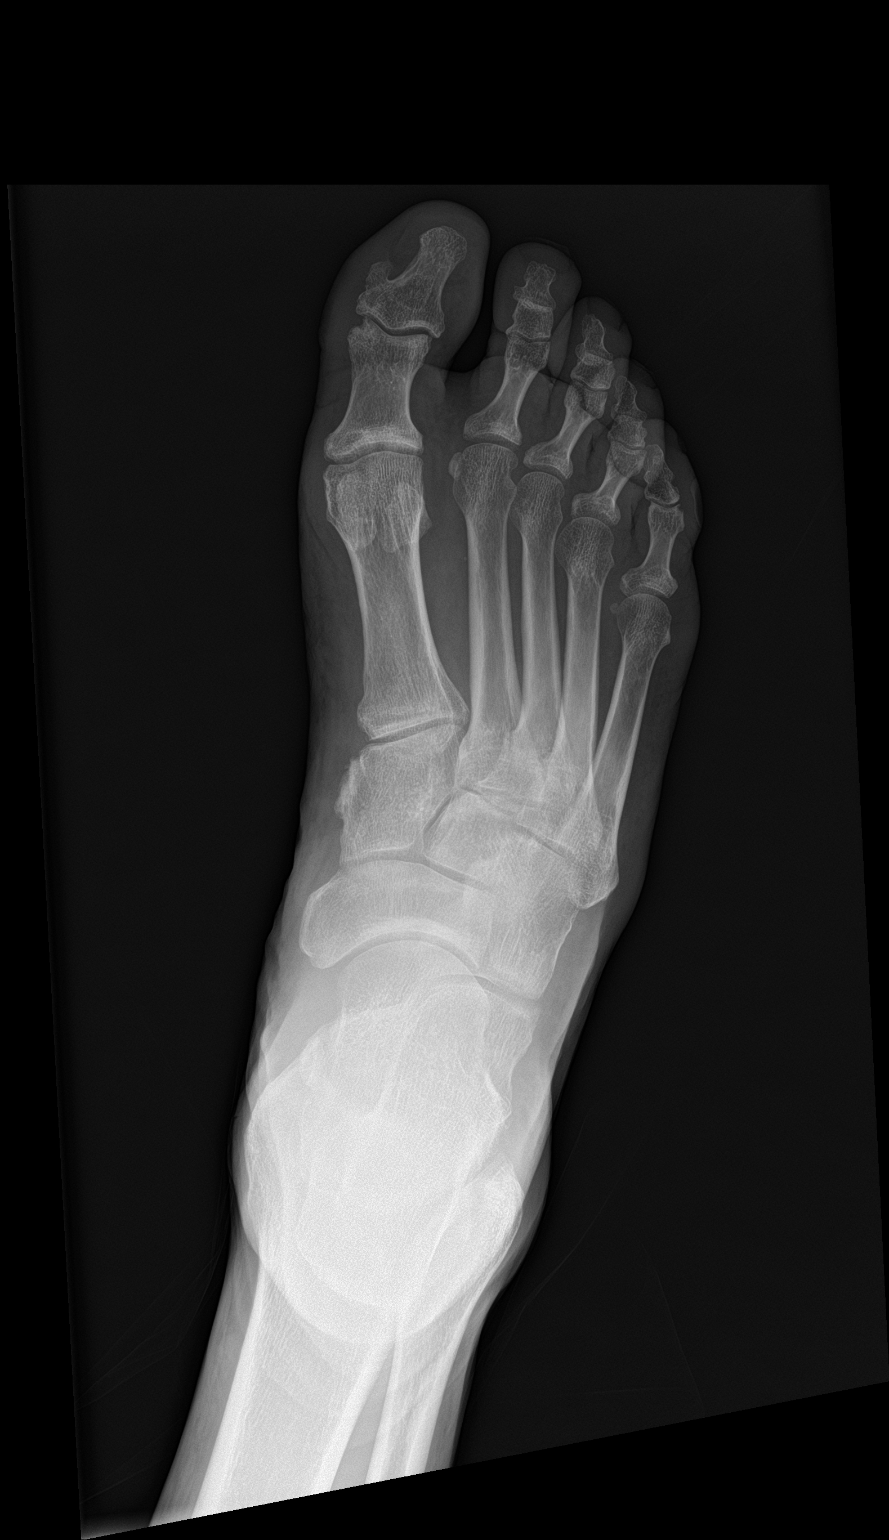

[foot obl]
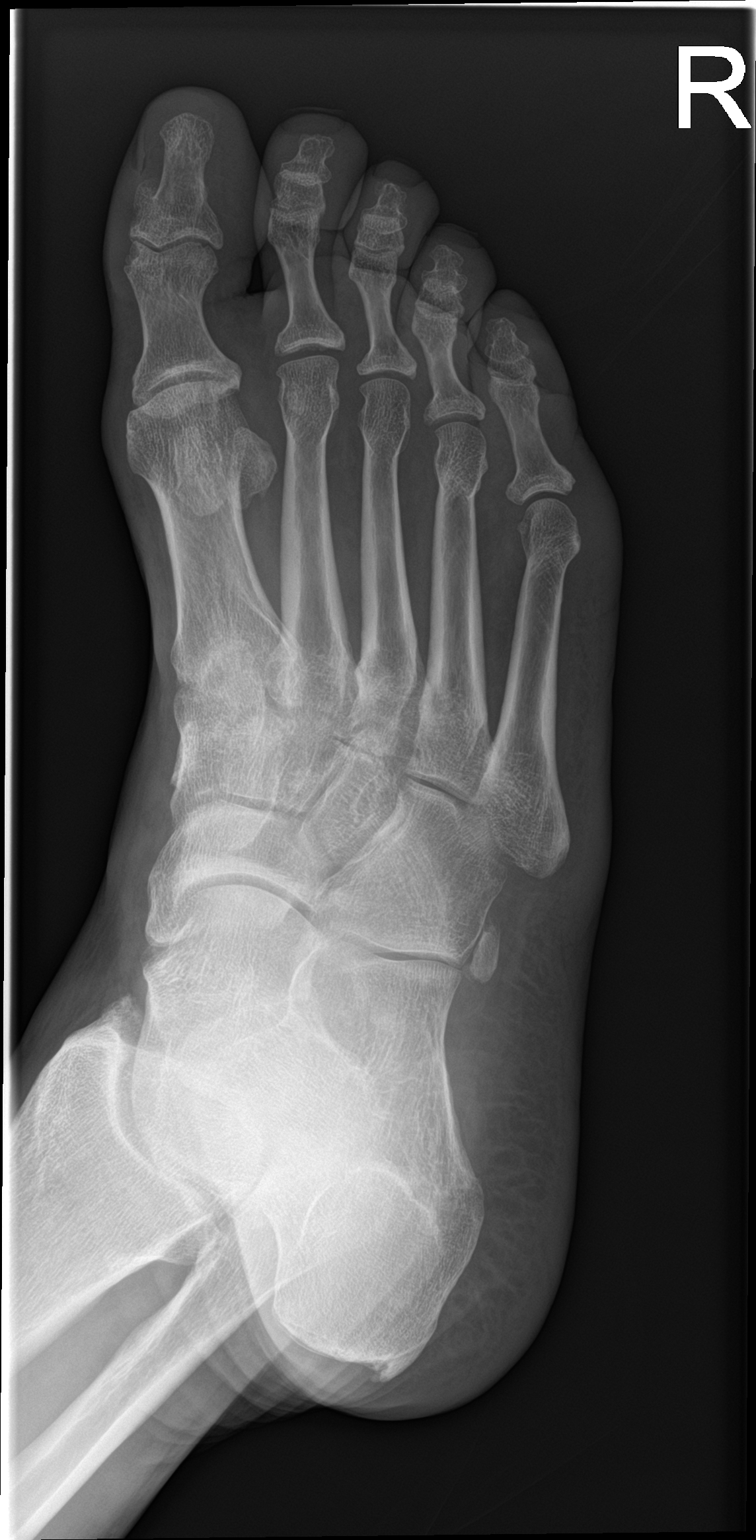

[foot lat]
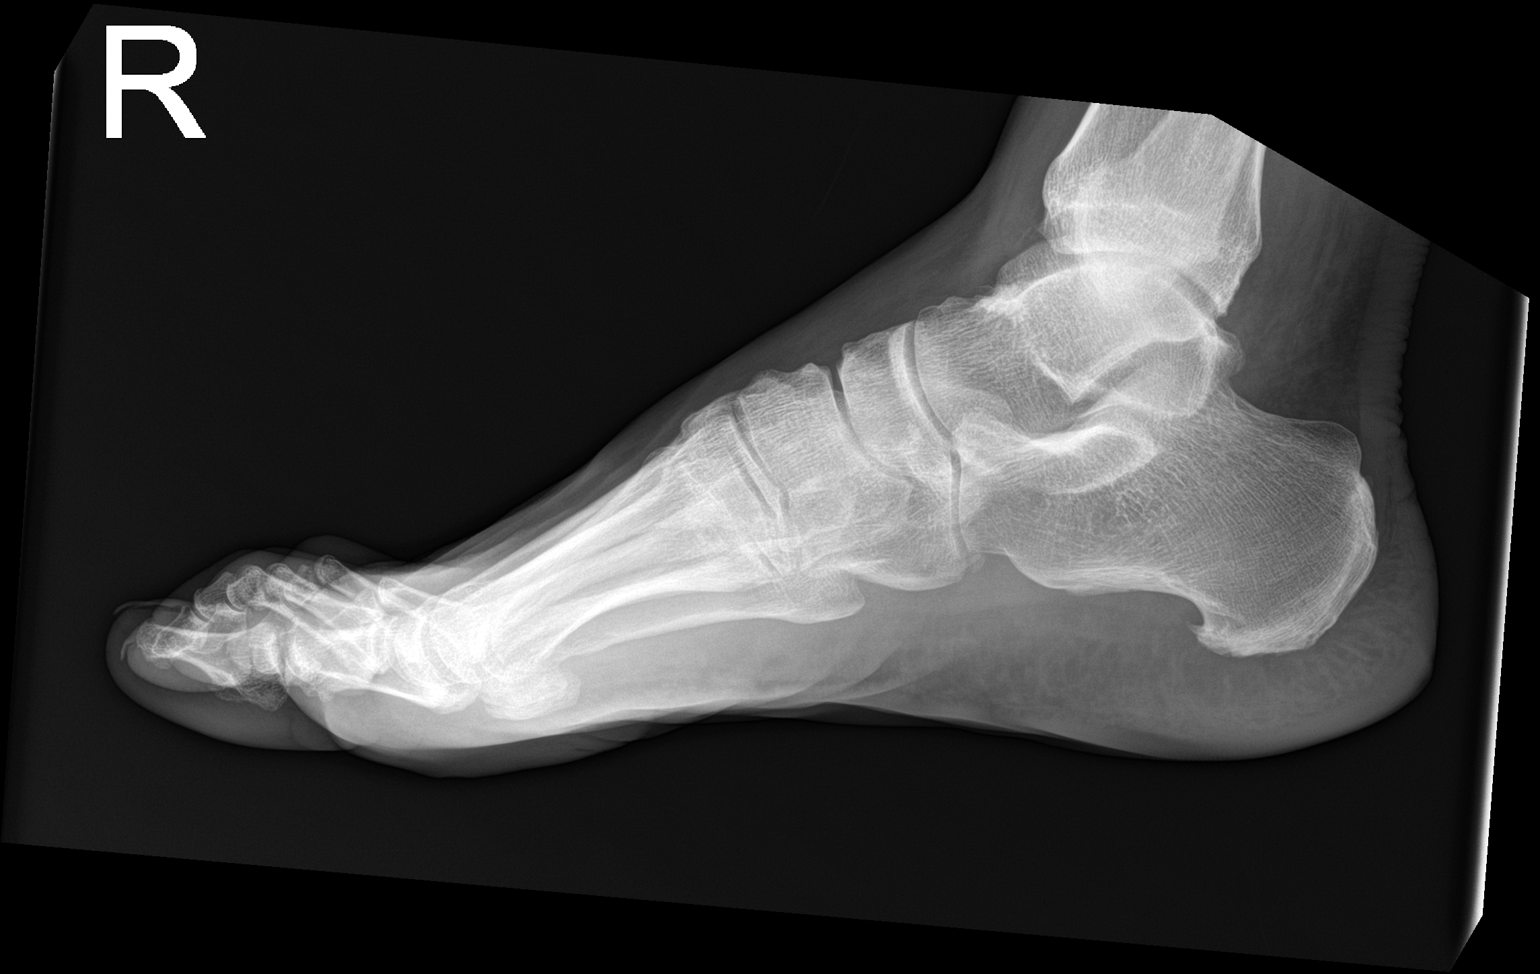

[3 of 3 positions shown; findings below may reference images not displayed]

FINDINGS: There is no acute fracture or dislocation. Mild degenerative changes
of the first MTP joint. The soft tissues appear unremarkable.
IMPRESSION: Negative.

## 2018-07-15 ENCOUNTER — Other Ambulatory Visit: Payer: Self-pay

## 2018-07-15 ENCOUNTER — Encounter (HOSPITAL_COMMUNITY): Payer: Self-pay | Admitting: *Deleted

## 2018-07-15 ENCOUNTER — Inpatient Hospital Stay (HOSPITAL_COMMUNITY)
Admission: EM | Admit: 2018-07-15 | Discharge: 2018-07-17 | DRG: 292 | Discharge status: Against Medical Advice | Payer: Medicare Other | Attending: Family Medicine | Admitting: Family Medicine

## 2018-07-15 ENCOUNTER — Emergency Department (HOSPITAL_COMMUNITY): Payer: Medicare Other

## 2018-07-15 DIAGNOSIS — F200 Paranoid schizophrenia: Secondary | ICD-10-CM | POA: Diagnosis present

## 2018-07-15 DIAGNOSIS — E1165 Type 2 diabetes mellitus with hyperglycemia: Secondary | ICD-10-CM | POA: Diagnosis present

## 2018-07-15 DIAGNOSIS — B1920 Unspecified viral hepatitis C without hepatic coma: Secondary | ICD-10-CM | POA: Diagnosis present

## 2018-07-15 DIAGNOSIS — R601 Generalized edema: Secondary | ICD-10-CM | POA: Diagnosis present

## 2018-07-15 DIAGNOSIS — N179 Acute kidney failure, unspecified: Secondary | ICD-10-CM | POA: Diagnosis present

## 2018-07-15 DIAGNOSIS — N400 Enlarged prostate without lower urinary tract symptoms: Secondary | ICD-10-CM | POA: Diagnosis present

## 2018-07-15 DIAGNOSIS — E119 Type 2 diabetes mellitus without complications: Secondary | ICD-10-CM

## 2018-07-15 DIAGNOSIS — F1410 Cocaine abuse, uncomplicated: Secondary | ICD-10-CM | POA: Diagnosis present

## 2018-07-15 DIAGNOSIS — Z8249 Family history of ischemic heart disease and other diseases of the circulatory system: Secondary | ICD-10-CM | POA: Diagnosis not present

## 2018-07-15 DIAGNOSIS — E785 Hyperlipidemia, unspecified: Secondary | ICD-10-CM | POA: Diagnosis present

## 2018-07-15 DIAGNOSIS — Z794 Long term (current) use of insulin: Secondary | ICD-10-CM

## 2018-07-15 DIAGNOSIS — I5043 Acute on chronic combined systolic (congestive) and diastolic (congestive) heart failure: Secondary | ICD-10-CM | POA: Diagnosis present

## 2018-07-15 DIAGNOSIS — I10 Essential (primary) hypertension: Secondary | ICD-10-CM | POA: Diagnosis not present

## 2018-07-15 DIAGNOSIS — I110 Hypertensive heart disease with heart failure: Secondary | ICD-10-CM | POA: Diagnosis not present

## 2018-07-15 DIAGNOSIS — Z590 Homelessness: Secondary | ICD-10-CM

## 2018-07-15 DIAGNOSIS — Z9114 Patient's other noncompliance with medication regimen: Secondary | ICD-10-CM

## 2018-07-15 DIAGNOSIS — Z9111 Patient's noncompliance with dietary regimen: Secondary | ICD-10-CM | POA: Diagnosis not present

## 2018-07-15 DIAGNOSIS — F17210 Nicotine dependence, cigarettes, uncomplicated: Secondary | ICD-10-CM | POA: Diagnosis present

## 2018-07-15 DIAGNOSIS — I509 Heart failure, unspecified: Secondary | ICD-10-CM

## 2018-07-15 DIAGNOSIS — I5021 Acute systolic (congestive) heart failure: Secondary | ICD-10-CM | POA: Diagnosis not present

## 2018-07-15 DIAGNOSIS — R7989 Other specified abnormal findings of blood chemistry: Secondary | ICD-10-CM | POA: Diagnosis present

## 2018-07-15 LAB — CBC WITH DIFFERENTIAL/PLATELET
ABS IMMATURE GRANULOCYTES: 0.05 10*3/uL (ref 0.00–0.07)
Basophils Absolute: 0 10*3/uL (ref 0.0–0.1)
Basophils Relative: 0 %
Eosinophils Absolute: 0 10*3/uL (ref 0.0–0.5)
Eosinophils Relative: 0 %
HCT: 34.2 % — ABNORMAL LOW (ref 39.0–52.0)
Hemoglobin: 10.6 g/dL — ABNORMAL LOW (ref 13.0–17.0)
Immature Granulocytes: 1 %
LYMPHS PCT: 11 %
Lymphs Abs: 1 10*3/uL (ref 0.7–4.0)
MCH: 27.1 pg (ref 26.0–34.0)
MCHC: 31 g/dL (ref 30.0–36.0)
MCV: 87.5 fL (ref 80.0–100.0)
Monocytes Absolute: 0.7 10*3/uL (ref 0.1–1.0)
Monocytes Relative: 8 %
Neutro Abs: 7.1 10*3/uL (ref 1.7–7.7)
Neutrophils Relative %: 80 %
Platelets: 325 10*3/uL (ref 150–400)
RBC: 3.91 MIL/uL — ABNORMAL LOW (ref 4.22–5.81)
RDW: 16.5 % — ABNORMAL HIGH (ref 11.5–15.5)
WBC: 8.9 10*3/uL (ref 4.0–10.5)
nRBC: 0 % (ref 0.0–0.2)

## 2018-07-15 LAB — LIPID PANEL
CHOLESTEROL: 134 mg/dL (ref 0–200)
HDL: 24 mg/dL — ABNORMAL LOW (ref >40)
LDL Cholesterol: 96 mg/dL (ref 0–99)
Total CHOL/HDL Ratio: 5.6 RATIO
Triglycerides: 71 mg/dL (ref <150)
VLDL: 14 mg/dL (ref 0–40)

## 2018-07-15 LAB — CBC
HCT: 34 % — ABNORMAL LOW (ref 39.0–52.0)
Hemoglobin: 10.4 g/dL — ABNORMAL LOW (ref 13.0–17.0)
MCH: 26.9 pg (ref 26.0–34.0)
MCHC: 30.6 g/dL (ref 30.0–36.0)
MCV: 87.9 fL (ref 80.0–100.0)
Platelets: 335 10*3/uL (ref 150–400)
RBC: 3.87 MIL/uL — AB (ref 4.22–5.81)
RDW: 16.6 % — ABNORMAL HIGH (ref 11.5–15.5)
WBC: 7.9 10*3/uL (ref 4.0–10.5)
nRBC: 0 % (ref 0.0–0.2)

## 2018-07-15 LAB — TROPONIN I: Troponin I: 0.04 ng/mL (ref <0.03)

## 2018-07-15 LAB — COMPREHENSIVE METABOLIC PANEL
ALT: 86 U/L — ABNORMAL HIGH (ref 0–44)
AST: 104 U/L — ABNORMAL HIGH (ref 15–41)
Albumin: 2.7 g/dL — ABNORMAL LOW (ref 3.5–5.0)
Alkaline Phosphatase: 146 U/L — ABNORMAL HIGH (ref 38–126)
Anion gap: 9 (ref 5–15)
BUN: 30 mg/dL — ABNORMAL HIGH (ref 6–20)
CO2: 23 mmol/L (ref 22–32)
Calcium: 8.5 mg/dL — ABNORMAL LOW (ref 8.9–10.3)
Chloride: 108 mmol/L (ref 98–111)
Creatinine, Ser: 0.89 mg/dL (ref 0.61–1.24)
GFR calc Af Amer: >60 mL/min (ref >60)
Glucose, Bld: 236 mg/dL — ABNORMAL HIGH (ref 70–99)
Potassium: 4 mmol/L (ref 3.5–5.1)
Sodium: 140 mmol/L (ref 135–145)
TOTAL PROTEIN: 5.8 g/dL — AB (ref 6.5–8.1)
Total Bilirubin: 1 mg/dL (ref 0.3–1.2)

## 2018-07-15 LAB — CREATININE, SERUM
Creatinine, Ser: 0.89 mg/dL (ref 0.61–1.24)
GFR calc Af Amer: >60 mL/min (ref >60)
GFR calc non Af Amer: >60 mL/min (ref >60)

## 2018-07-15 LAB — TSH: TSH: 0.555 u[IU]/mL (ref 0.350–4.500)

## 2018-07-15 LAB — BRAIN NATRIURETIC PEPTIDE: B Natriuretic Peptide: 1372.1 pg/mL — ABNORMAL HIGH (ref 0.0–100.0)

## 2018-07-15 LAB — HEMOGLOBIN A1C
Hgb A1c MFr Bld: 7 % — ABNORMAL HIGH (ref 4.8–5.6)
Mean Plasma Glucose: 154.2 mg/dL

## 2018-07-15 MED ORDER — ENOXAPARIN SODIUM 40 MG/0.4ML ~~LOC~~ SOLN
40.0000 mg | SUBCUTANEOUS | Status: DC
  Administered 2018-07-15 – 2018-07-16 (×2): 40 mg via SUBCUTANEOUS
  Filled 2018-07-15 (×2): qty 0.4

## 2018-07-15 MED ORDER — FUROSEMIDE 10 MG/ML IJ SOLN
60.0000 mg | Freq: Once | INTRAMUSCULAR | Status: AC
  Administered 2018-07-15: 60 mg via INTRAVENOUS
  Filled 2018-07-15: qty 8

## 2018-07-15 MED ORDER — ONDANSETRON HCL 4 MG/2ML IJ SOLN: 4.0000 mg | Freq: Four times a day (QID) | INTRAMUSCULAR | Status: DC | PRN

## 2018-07-15 MED ORDER — ACETAMINOPHEN 325 MG PO TABS
650.0000 mg | ORAL_TABLET | Freq: Four times a day (QID) | ORAL | Status: DC | PRN
  Administered 2018-07-15: 650 mg via ORAL
  Filled 2018-07-15: qty 2

## 2018-07-15 MED ORDER — NICOTINE 21 MG/24HR TD PT24
21.0000 mg | MEDICATED_PATCH | Freq: Every day | TRANSDERMAL | Status: DC
  Administered 2018-07-15 – 2018-07-17 (×3): 21 mg via TRANSDERMAL
  Filled 2018-07-15 (×3): qty 1

## 2018-07-15 MED ORDER — ZOLPIDEM TARTRATE 5 MG PO TABS
5.0000 mg | ORAL_TABLET | Freq: Every evening | ORAL | Status: DC | PRN
  Administered 2018-07-16 (×2): 5 mg via ORAL
  Filled 2018-07-15 (×2): qty 1

## 2018-07-15 MED ORDER — ALUM & MAG HYDROXIDE-SIMETH 200-200-20 MG/5ML PO SUSP
15.0000 mL | ORAL | Status: DC | PRN
  Administered 2018-07-15: 15 mL via ORAL
  Filled 2018-07-15: qty 30

## 2018-07-15 MED ORDER — SODIUM CHLORIDE 0.9 % IV SOLN: 250.0000 mL | INTRAVENOUS | Status: DC | PRN

## 2018-07-15 MED ORDER — ATORVASTATIN CALCIUM 40 MG PO TABS
40.0000 mg | ORAL_TABLET | Freq: Every day | ORAL | Status: DC
  Administered 2018-07-15 – 2018-07-17 (×3): 40 mg via ORAL
  Filled 2018-07-15 (×3): qty 1

## 2018-07-15 MED ORDER — LISINOPRIL 5 MG PO TABS
5.0000 mg | ORAL_TABLET | Freq: Every day | ORAL | Status: DC
  Administered 2018-07-15 – 2018-07-17 (×3): 5 mg via ORAL
  Filled 2018-07-15 (×3): qty 1

## 2018-07-15 MED ORDER — FUROSEMIDE 10 MG/ML IJ SOLN
40.0000 mg | Freq: Two times a day (BID) | INTRAMUSCULAR | Status: DC
  Administered 2018-07-15 – 2018-07-17 (×4): 40 mg via INTRAVENOUS
  Filled 2018-07-15 (×4): qty 4

## 2018-07-15 MED ORDER — HYDRALAZINE HCL 20 MG/ML IJ SOLN
10.0000 mg | Freq: Once | INTRAMUSCULAR | Status: AC
  Administered 2018-07-15: 10 mg via INTRAVENOUS
  Filled 2018-07-15: qty 1

## 2018-07-15 MED ORDER — ASPIRIN EC 81 MG PO TBEC
81.0000 mg | DELAYED_RELEASE_TABLET | Freq: Every day | ORAL | Status: DC
  Administered 2018-07-15 – 2018-07-17 (×3): 81 mg via ORAL
  Filled 2018-07-15 (×3): qty 1

## 2018-07-15 MED ORDER — ACETAMINOPHEN 325 MG PO TABS
650.0000 mg | ORAL_TABLET | ORAL | Status: DC | PRN
  Administered 2018-07-16 – 2018-07-17 (×3): 650 mg via ORAL
  Filled 2018-07-15 (×3): qty 2

## 2018-07-15 MED ORDER — POTASSIUM CHLORIDE CRYS ER 20 MEQ PO TBCR
20.0000 meq | EXTENDED_RELEASE_TABLET | Freq: Two times a day (BID) | ORAL | Status: DC
  Administered 2018-07-15 – 2018-07-17 (×4): 20 meq via ORAL
  Filled 2018-07-15 (×4): qty 1

## 2018-07-15 MED ORDER — SODIUM CHLORIDE 0.9% FLUSH: 3.0000 mL | INTRAVENOUS | Status: DC | PRN

## 2018-07-15 MED ORDER — NITROGLYCERIN 2 % TD OINT
1.0000 [in_us] | TOPICAL_OINTMENT | Freq: Once | TRANSDERMAL | Status: AC
  Administered 2018-07-15: 1 [in_us] via TOPICAL
  Filled 2018-07-15: qty 1

## 2018-07-15 MED ORDER — LORAZEPAM 1 MG PO TABS
1.0000 mg | ORAL_TABLET | Freq: Once | ORAL | Status: DC
  Filled 2018-07-15: qty 1

## 2018-07-15 MED ORDER — SODIUM CHLORIDE 0.9% FLUSH
3.0000 mL | Freq: Two times a day (BID) | INTRAVENOUS | Status: DC
  Administered 2018-07-15 – 2018-07-17 (×4): 3 mL via INTRAVENOUS

## 2018-07-15 NOTE — ED Notes (Signed)
Bed: [facility] Expected date:  Expected time:  Means of arrival:  Comments: 

## 2018-07-15 NOTE — H&P (Signed)
[facility] History and Physical  [name] QZE:[id number] DOB: 07/28/1960 DOA: 07/15/2018  Referring physician: ED  PCP: [name], NP   Chief Complaint: Shortness of breath  HPI: [name] is a 58 y.o. male with past medical history of congestive heart failure, cocaine abuse, diabetes mellitus, hepatitis C, homelessness, hypertension, polysubstance abuse, schizophrenia presented to hospital with shortness of breath leg swelling for the last 2 to 3 days.  Patient also stated that he had dyspnea on exertion and had progressive shortness of breath.  He was coughing up some whitish foamy fluid and had some chest discomfort which he described as sharp and stabbing radiating towards his throat.  Patient also complained of difficulty urinating with swelling in his groin area for the last 2 to 3 days.  He has noticed increasing scrotal edema recently..  Patient was recently in jail for 90 days and got out of jail 2 weeks back.  During his stay in jail, he was given a fluid medication but he did not have any prescription after he left the jail.  Patient admits to smoking 1 pack of cigarettes a day and using cocaine the last use was yesterday.  Patient denies any fever runny nose but is a mild sore throat.  Denies any nausea, vomiting or constipation.  He admits to having difficulty walking due to leg swelling but is unaware of the amount of weight gain.  He did have some urination in the ED which made him feel better.  Patient denies any dizziness, lightheadedness or syncope.  ED Course: In the ED, patient received Tylenol, Lasix 60 mg x 1 and nitroglycerin 2% ointment 1 inch.  Review of Systems:  All systems were reviewed and were negative unless otherwise mentioned in the HPI  Past Medical History:  Diagnosis Date  . CHF (congestive heart failure) (HCC)   . Chronic foot pain   . Cocaine abuse (HCC)   . Diabetes mellitus without complication (HCC)   . Hepatitis C    . Homelessness   . Hypertension   . Neuropathy   . Polysubstance abuse (HCC)   . Schizophrenia (HCC)    History reviewed. No pertinent surgical history.  Social History:  reports that he has been smoking cigarettes. He has a 20.00 pack-year smoking history. He uses smokeless tobacco. He reports current alcohol use. He reports current drug use. Frequency: 7.00 times per week. Drugs: "Crack" cocaine, Cocaine, and Marijuana.  Allergies  Allergen Reactions  . Haldol [Haloperidol] Other (See Comments)    Muscle spasms, loss of voluntary movement. However, pt has taken Thorazine on multiple occasions with no adverse effects.     Family History  Problem Relation Age of Onset  . Hypertension Other   . Diabetes Other     Prior to Admission medications   Medication Sig Start Date End Date Taking? Authorizing Provider  acetaminophen (TYLENOL) 325 MG tablet Take 2 tablets (650 mg total) by mouth every 6 (six) hours as needed. Patient not taking: Reported on 07/15/2018 04/01/18   Dietrich Pates, PA-C  aspirin EC 81 MG tablet Take 1 tablet (81 mg total) by mouth daily. Patient not taking: Reported on 07/15/2018 03/30/18 03/30/19  Dorrell, Cathleen Corti, MD  atorvastatin (LIPITOR) 40 MG tablet Take 1 tablet (40 mg total) by mouth daily. Patient not taking: Reported on 07/15/2018 03/30/18   Dorrell, Cathleen Corti, MD  furosemide (LASIX) 20 MG tablet Take 1 tablet (20 mg total) by mouth daily. Patient not taking: Reported  on 07/15/2018 03/30/18 03/30/19  Dorrell, Cathleen Cortieborah N, MD  lisinopril (PRINIVIL,ZESTRIL) 5 MG tablet Take 1 tablet (5 mg total) by mouth daily. Patient not taking: Reported on 07/15/2018 03/31/18   Dionne Anoorrell, Deborah N, MD   Physical Exam: Vitals:   07/15/18 0217 07/15/18 0642 07/15/18 0645 07/15/18 0846  BP: (!) 156/106 (!) 155/102 (!) 159/107 (!) 161/128  Pulse: 93 92 88 94  Resp: 18 (!) 24 18 19   Temp: 98.5 F (36.9 C)     TempSrc: Oral     SpO2: 93% 90% 91% 98%  Weight: 77.1 kg     Height:  5\' 9"  (1.753 m)       Wt Readings from Last 3 Encounters:  07/15/18 77.1 kg  04/01/18 76.3 kg  03/30/18 76.3 kg   General:  Average built, not in obvious distress, on nasal cannula oxygen HENT: Normocephalic, pupils equally reacting to light and accommodation.  No scleral pallor or icterus noted. Oral mucosa is moist.  Chest:  Clear breath sounds.  Diminished breath sounds bilaterally. No crackles or wheezes.  CVS: S1 &S2 heard. No murmur.  Regular rate and rhythm. Abdomen: Soft, nontender, mildly distended flanks, bowel sounds are heard.  Gross scrotal edema from hydrocele Extremities: No cyanosis, clubbing but bilateral lower extremity pedal pitting edema noted.  Peripheral pulses are palpable. Psych: Alert, awake and oriented, normal mood CNS:  No cranial nerve deficits.  Power equal in all extremities.   No cerebellar signs.   Skin: Warm and dry.  No rashes noted.  Labs on Admission:  Basic Metabolic Panel: Recent Labs  Lab 07/15/18 0410  NA 140  K 4.0  CL 108  CO2 23  GLUCOSE 236*  BUN 30*  CREATININE 0.89  CALCIUM 8.5*   Liver Function Tests: Recent Labs  Lab 07/15/18 0410  AST 104*  ALT 86*  ALKPHOS 146*  BILITOT 1.0  PROT 5.8*  ALBUMIN 2.7*   No results for input(s): LIPASE, AMYLASE in the last 168 hours. No results for input(s): AMMONIA in the last 168 hours. CBC: Recent Labs  Lab 07/15/18 0410  WBC 8.9  NEUTROABS 7.1  HGB 10.6*  HCT 34.2*  MCV 87.5  PLT 325   Cardiac Enzymes: Recent Labs  Lab 07/15/18 0410  TROPONINI 0.04*    BNP (last 3 results) Recent Labs    03/16/18 0648 03/28/18 0309 07/15/18 0410  BNP 351.5* 410.3* 1,372.1*    ProBNP (last 3 results) No results for input(s): PROBNP in the last 8760 hours.  CBG: No results for input(s): GLUCAP in the last 168 hours.  Radiological Exams on Admission: Dg Chest 2 View  Result Date: 07/15/2018 CLINICAL DATA:  Shortness of breath, lower extremity swelling EXAM: CHEST - 2 VIEW  COMPARISON:  03/28/2018 FINDINGS: Prominent interstitial markings. Mild lingular and right infrahilar opacities. The overall appearance suggests mild perihilar edema. No definite pleural effusions. No pneumothorax. Cardiomegaly. Mild degenerative changes of the visualized thoracolumbar spine. IMPRESSION: Cardiomegaly with suspected mild perihilar edema. Electronically Signed   By: Charline BillsSriyesh  Krishnan M.D.   On: 07/15/2018 03:41    EKG: Independently reviewed by me and shows normal sinus rhythm   Assessment/Plan Principal Problem:   Acute CHF (congestive heart failure) (HCC) Active Problems:   Diabetes mellitus (HCC)   Essential hypertension, benign   Chronic paranoid schizophrenia (HCC)  Acute on chronic sysolic CHF exacerbation.  Will admit the patient in telemetry unit. Monitor for arrythmias. Continue the patient on IV Lasix.  We will put the patient  on fluid restriction, strict intake and output charting.  Daily weights.  Follow CHF protocol.  Trend troponins.  We will closely monitor renal function on diuretics. Resume home medications including ACE inhibitor's aspirin.  Hold off with beta-blockers for now due to acute decompensation.  Patient had a 2D echocardiogram on 03/16/2018 which showed action fraction of 30 to 35%.  Current BNP of 1372 last BNP on 9/19 was 410.  Check TSH, lipid panel, A1c  History of diabetes mellitus type 2.  We will put the patient on sliding scale insulin, Accu-Cheks diabetic diet.  We will closely monitor.Will obtain A1c.  Essential hypertension.  Will closely monitor.  Resume outpatient medication.  Polysubstance abuse including nicotine abuse and cocaine abuse.  Patient was extensively counseled about it.  We will put the patient on nicotine patch while in the hospital.  Consultant : None  Code Status: Full  DVT Prophylaxis: Lovenox subcu  Family Communication:  Patients condition and plan of care including tests being ordered have been discussed with the  patient and who indicate understanding and agree with the plan.  Disposition Plan: Home likely in 2 to 3 days  Severity of Illness: The appropriate patient status for this patient is INPATIENT. Inpatient status is judged to be reasonable and necessary in order to provide the required intensity of service to ensure the patient's safety. The patient's presenting symptoms, physical exam findings, and initial radiographic and laboratory data in the context of their chronic comorbidities is felt to place them at high risk for further clinical deterioration. Furthermore, it is not anticipated that the patient will be medically stable for discharge from the hospital within 2 midnights of admission. The following factors support the patient status of inpatient.   * I certify that at the point of admission it is my clinical judgment that the patient will require inpatient hospital care spanning beyond 2 midnights from the point of admission due to high intensity of service, high risk for further deterioration and high frequency of surveillance required.*   Signed, Joycelyn Das, MD [facility]

## 2018-07-15 NOTE — ED Notes (Signed)
ED TO INPATIENT HANDOFF REPORT  Name/Age/Gender [name] 58 y.o. male  Code Status Code Status History    Date Active Date Inactive Code Status Order ID Comments User Context   03/28/2018 0614 03/30/2018 1603 Full Code [id number]  Burna CashSantos-Sanchez, Idalys, MD Inpatient   03/16/2018 0828 03/16/2018 1623 Full Code [id number]  Molt, Bethany, DO ED   01/26/2018 0429 01/26/2018 1453 Full Code [id number]  Paula LibraMolpus, John, MD ED   12/30/2017 1738 12/31/2017 1202 Full Code [id number]  Vanetta MuldersZackowski, Scott, MD ED   12/26/2017 0510 12/26/2017 1720 Full Code [id number]  Garlon HatchetSanders, Lisa M, PA-C ED   12/18/2017 0243 12/18/2017 1328 Full Code [id number]  Gwyneth SproutPlunkett, Whitney, MD ED   12/12/2017 0401 12/12/2017 1708 Full Code [id number]  Rise MuLeaphart, Kenneth T, PA-C ED   11/23/2017 0609 11/23/2017 1316 Full Code [id number]  Ward, Chase PicketJaime Pilcher, PA-C ED   10/06/2017 0637 10/07/2017 1723 Full Code [id number]  Garlon HatchetSanders, Lisa M, PA-C ED   09/22/2017 0819 09/23/2017 1352 Full Code [id number]  Pricilla LovelessGoldston, Scott, MD ED   09/18/2017 0528 09/18/2017 1644 Full Code [id number]  Roxy HorsemanBrowning, Robert, PA-C ED   09/13/2017 2153 09/14/2017 1658 Full Code [id number]  Raeford RazorKohut, Stephen, MD ED   03/24/2017 0431 03/24/2017 1645 Full Code [id number]  Anselm PancoastJoy, Shawn C, PA-C ED   01/27/2017 0635 01/28/2017 1637 Full Code [id number]  Glynn Octaveancour, Stephen, MD ED   01/18/2017 0358 01/18/2017 1705 Full Code [id number]  Antony MaduraHumes, Kelly, PA-C ED   10/15/2016 0111 10/16/2016 1548 Full Code [id number]  Garlon HatchetSanders, Lisa M, PA-C ED   10/10/2016 0534 10/10/2016 1442 Full Code [id number]  Renne CriglerGeiple, Joshua, PA-C ED   09/29/2016 2146 10/04/2016 1448 Full Code [id number]  Jackelyn PolingBerry, Jason A, NP Inpatient   07/25/2015 0656 07/25/2015 1853 Full Code [id number]  Renne CriglerGeiple, Joshua, PA-C ED   07/21/2015 2148 07/22/2015 1649 Full Code [id number]  Bethann BerkshireZammit, Joseph, MD ED   07/15/2015 2259 07/16/2015 0401 Full Code [id number]  Lyndal PulleyKnott, Daniel, MD ED   05/27/2015 2306 05/28/2015 0544 Full Code [id number]  Gwyneth SproutPlunkett, Whitney, MD ED   05/09/2015 0318  05/09/2015 1118 Full Code [id number]  Gilda CreasePollina, Christopher J, MD ED   04/14/2015 0339 04/15/2015 1502 Full Code [id number]  Earley FavorSchulz, Gail, NP ED   04/14/2015 0213 04/14/2015 0339 Full Code [id number]  Earley FavorSchulz, Gail, NP ED   03/14/2015 0749 03/14/2015 1425 Full Code [id number]  Arthor CaptainHarris, Abigail, PA-C ED   03/12/2015 1629 03/13/2015 1312 Full Code [id number]  Dub MikesBerman, Rebecca R, RN ED   01/26/2015 0336 01/27/2015 1606 Full Code [id number]  Hillary BowGardner, Jared M, DO ED   01/17/2015 2158 01/23/2015 1727 Full Code [id number]  Shari ProwsPucilowska, Jolanta B, MD Inpatient   01/16/2015 1142 01/17/2015 2158 Full Code [id number]  Camprubi-SomsFrance Ravens, Mercedes, PA-C ED   12/22/2014 0451 12/22/2014 1407 Full Code [id number]  Emilia BeckSzekalski, Kaitlyn, PA-C ED   11/21/2014 0339 11/22/2014 1455 Full Code [id number]  Ward, Layla MawKristen N, DO ED   11/13/2014 0258 11/14/2014 1316 Full Code [id number]  Shon BatonHorton, Courtney F, MD ED   11/07/2014 1559 11/08/2014 1432 Full Code [id number]  Samuel JesterMcManus, Kathleen, DO ED   11/04/2014 0056 11/05/2014 1459 Full Code [id number]  Pricilla LovelessGoldston, Scott, MD ED   10/13/2014 0406 10/13/2014 0942 Full Code [id number]  Earley FavorSchulz, Gail, NP ED   10/08/2014 0933 10/08/2014 1345 Full Code [id number]  Blake DivineWofford, John, MD ED   10/01/2014 0744 10/01/2014 1719 Full Code [id number]  Gwyneth SproutPlunkett, Whitney, MD ED   09/07/2014 0322 09/08/2014 1531 Full Code [id number]  Earley FavorSchulz, Gail, NP ED   08/10/2014 1416 08/12/2014 1608 Full  Code [id number]  Assunta Found, NP Inpatient   08/10/2014 0401 08/10/2014 1416 Full Code [id number]  Gilmer Mor ED   07/29/2014 0226 07/30/2014 1400 Full Code [id number]  Lynden Oxford, MD ED   07/06/2014 0441 07/06/2014 1659 Full Code [id number]  Arnoldo Hooker, PA-C ED   06/22/2014 1031 06/22/2014 2023 Full Code [id number]  Sharlene Motts, PA-C ED   06/13/2014 0617 06/14/2014 1419 Full Code [id number]  Emilia Beck, PA-C ED   06/04/2014 0504 06/04/2014 2312 Full Code [id number]  Ward Givens, MD ED   05/20/2014 1558 05/21/2014 1119 Full Code [id number]  Nanine Means, NP Inpatient   05/20/2014 0237 05/20/2014 1558 Full Code [id number]  Arman Filter, NP ED   05/18/2014 0246 05/18/2014 1741 Full Code [id number]  Lyanne Co, MD ED   05/13/2014 2103 05/16/2014 1627 Full Code [id number]  Nanine Means, NP Inpatient   05/13/2014 0342 05/13/2014 2103 Full Code [id number]  Jeannetta Ellis, PA-C ED   05/12/2014 0210 05/12/2014 0909 Full Code [id number]  Gilda Crease., MD ED   04/03/2014 1508 04/04/2014 0340 Full Code [id number]  Suzi Roots, MD ED   03/25/2014 0945 03/25/2014 1846 Full Code [id number]  Deitra Mayo, RN ED   03/16/2014 0611 03/16/2014 1522 Full Code [id number]  Junius Finner, PA-C ED   03/10/2014 1108 03/10/2014 2200 Full Code [id number]  Terri Piedra, PA-C ED   03/07/2014 0331 03/07/2014 1619 Full Code [id number]  Ethelda Chick, MD ED   01/05/2014 0525 01/05/2014 1633 Full Code [id number]  Angus Seller, PA-C ED   01/01/2014 0615 01/01/2014 1520 Full Code [id number]  Trevor Mace, PA-C ED   12/12/2013 0704 12/14/2013 1529 Full Code [id number]  Renne Crigler, PA-C ED   12/10/2013 0033 12/10/2013 1631 Full Code [id number]  Arman Filter, NP ED   10/08/2013 1351 10/13/2013 1531 Full Code [id number]  Fransisca Kaufmann, NP Inpatient   10/08/2013 0352 10/08/2013 1351 Full Code [id number]  Olivia Mackie, MD ED   08/12/2013 0505 08/12/2013 1438 Full Code [id number]  Dione Booze, MD ED   08/04/2013 1624 08/10/2013 1445 Full Code [id number]  Benjaman Pott, MD Inpatient   08/04/2013 0222 08/04/2013 1624 Full Code [id number]  Arman Filter, NP ED   05/26/2013 0156 05/26/2013 1202 Full Code [id number]  Arman Filter, NP ED   04/18/2013 0217 04/23/2013 1615 Full Code [id number]  Janey Greaser Inpatient   04/17/2013 2341 04/18/2013 0217 Full Code [id number]  [name], PA-C ED   04/05/2013 0306 04/05/2013 1934 Full Code [id number]  Molpus, Carlisle Beers, MD ED   03/26/2013 0205 03/26/2013 1727 Full Code [id number]  Arman Filter, NP ED   03/14/2013 0706 03/15/2013  2150 Full Code [id number]  Ward, Layla Maw, DO ED      Home/SNF/Other Home  Chief Complaint Leg Swelling  Level of Care/Admitting Diagnosis ED Disposition    ED Disposition Condition Comment   Admit  Hospital [facility] [[id number]]  Level of Care: Telemetry [5]  Admit to tele based on following criteria: Acute CHF  Diagnosis: CHF (congestive heart failure) [facility]) [[id number]]  Admitting Physician: Joycelyn Das [[id number]]  Attending Physician: Joycelyn Das [[id number]]  Estimated length of stay: 3 - 4 days  Certification:: I certify this patient will need inpatient services for at least 2 midnights  PT Class (Do Not Modify): Inpatient [101]  PT Acc Code (Do Not Modify): Private [1]       Medical History  Past Medical History:  Diagnosis Date  . CHF (congestive heart failure) (HCC)   . Chronic foot pain   . Cocaine abuse (HCC)   . Diabetes mellitus without complication (HCC)   . Hepatitis C   . Homelessness   . Hypertension   . Neuropathy   . Polysubstance abuse (HCC)   . Schizophrenia (HCC)     Allergies Allergies  Allergen Reactions  . Haldol [Haloperidol] Other (See Comments)    Muscle spasms, loss of voluntary movement. However, pt has taken Thorazine on multiple occasions with no adverse effects.     IV Location/Drains/Wounds Patient Lines/Drains/Airways Status   Active Line/Drains/Airways    Name:   Placement date:   Placement time:   Site:   Days:   Peripheral IV 07/15/18 Left Forearm   07/15/18    0645    Forearm   less than 1          Labs/Imaging Results for orders placed or performed during the hospital encounter of 07/15/18 (from the past 48 hour(s))  Comprehensive metabolic panel     Status: Abnormal   Collection Time: 07/15/18  4:10 AM  Result Value Ref Range   Sodium 140 135 - 145 mmol/L   Potassium 4.0 3.5 - 5.1 mmol/L   Chloride 108 98 - 111 mmol/L   CO2 23 22 - 32 mmol/L   Glucose, Bld 236 (H) 70 - 99 mg/dL   BUN 30  (H) 6 - 20 mg/dL   Creatinine, Ser 1.610.89 0.61 - 1.24 mg/dL   Calcium 8.5 (L) 8.9 - 10.3 mg/dL   Total Protein 5.8 (L) 6.5 - 8.1 g/dL   Albumin 2.7 (L) 3.5 - 5.0 g/dL   AST 096104 (H) 15 - 41 U/L   ALT 86 (H) 0 - 44 U/L   Alkaline Phosphatase 146 (H) 38 - 126 U/L   Total Bilirubin 1.0 0.3 - 1.2 mg/dL   GFR calc non Af Amer >60 >60 mL/min   GFR calc Af Amer >60 >60 mL/min   Anion gap 9 5 - 15    Comment: Performed at [facility], [address] [place], [place] [address]  Brain natriuretic peptide     Status: Abnormal   Collection Time: 07/15/18  4:10 AM  Result Value Ref Range   B Natriuretic Peptide 1,372.1 (H) 0.0 - 100.0 pg/mL    Comment: Performed at [facility], [address] [place], [place] [address]  CBC with Differential     Status: Abnormal   Collection Time: 07/15/18  4:10 AM  Result Value Ref Range   WBC 8.9 4.0 - 10.5 K/uL   RBC 3.91 (L) 4.22 - 5.81 MIL/uL   Hemoglobin 10.6 (L) 13.0 - 17.0 g/dL   HCT 14.734.2 (L) 82.939.0 - 56.252.0 %   MCV 87.5 80.0 - 100.0 fL   MCH 27.1 26.0 - 34.0 pg   MCHC 31.0 30.0 - 36.0 g/dL   RDW 13.016.5 (H) 86.511.5 - 78.415.5 %   Platelets 325 150 - 400 K/uL   nRBC 0.0 0.0 - 0.2 %   Neutrophils Relative % 80 %   Neutro Abs 7.1 1.7 - 7.7 K/uL   Lymphocytes Relative 11 %   Lymphs Abs 1.0 0.7 - 4.0 K/uL   Monocytes Relative 8 %   Monocytes Absolute 0.7 0.1 - 1.0 K/uL   Eosinophils Relative 0 %   Eosinophils Absolute 0.0 0.0 - 0.5 K/uL   Basophils Relative 0 %   Basophils  Absolute 0.0 0.0 - 0.1 K/uL   Immature Granulocytes 1 %   Abs Immature Granulocytes 0.05 0.00 - 0.07 K/uL    Comment: Performed at [facility], [address], [place], [place] [address]  Troponin I - Once     Status: Abnormal   Collection Time: 07/15/18  4:10 AM  Result Value Ref Range   Troponin I 0.04 (HH) <0.03 ng/mL    Comment: CRITICAL RESULT CALLED TO, READ BACK BY AND VERIFIED WITH: COLES,L RN AT 0543 07/15/18 BY  TIBBITTS,K Performed at [facility], [address], [place], [place] [address]    Dg Chest 2 View  Result Date: 07/15/2018 CLINICAL DATA:  Shortness of breath, lower extremity swelling EXAM: CHEST - 2 VIEW COMPARISON:  03/28/2018 FINDINGS: Prominent interstitial markings. Mild lingular and right infrahilar opacities. The overall appearance suggests mild perihilar edema. No definite pleural effusions. No pneumothorax. Cardiomegaly. Mild degenerative changes of the visualized thoracolumbar spine. IMPRESSION: Cardiomegaly with suspected mild perihilar edema. Electronically Signed   By: Charline Bills M.D.   On: 07/15/2018 03:41    Pending Labs [employer] (From admission, onward)    Start     Ordered   Signed and Interior and spatial designer  Daily,   R     Signed and Held   Signed and Held  CBC  (enoxaparin (LOVENOX)    CrCl >/= 30 ml/min)  Once,   R    Comments:  Baseline for enoxaparin therapy IF NOT ALREADY DRAWN.  Notify MD if PLT < 100 K.    Signed and Held   Signed and Held  Creatinine, serum  (enoxaparin (LOVENOX)    CrCl >/= 30 ml/min)  Once,   R    Comments:  Baseline for enoxaparin therapy IF NOT ALREADY DRAWN.    Signed and Held   Signed and Held  Creatinine, serum  (enoxaparin (LOVENOX)    CrCl >/= 30 ml/min)  Weekly,   R    Comments:  while on enoxaparin therapy    Signed and Held   Signed and Held  Hemoglobin A1c  Once,   R     Signed and Held   Signed and Held  TSH  Once,   R     Signed and Held   Signed and Held  Lipid panel  Once,   R     Signed and Held          Vitals/Pain Today's Vitals   07/15/18 0217 07/15/18 0642 07/15/18 0645 07/15/18 0846  BP: (!) 156/106 (!) 155/102 (!) 159/107 (!) 161/128  Pulse: 93 92 88 94  Resp: 18 (!) 24 18 19   Temp: 98.5 F (36.9 C)     TempSrc: Oral     SpO2: 93% 90% 91% 98%  Weight: 77.1 kg     Height: 5\' 9"  (1.753 m)       Isolation Precautions No active  isolations  Medications Medications  acetaminophen (TYLENOL) tablet 650 mg (650 mg Oral Given 07/15/18 0843)  furosemide (LASIX) injection 60 mg (60 mg Intravenous Given 07/15/18 0646)  nitroGLYCERIN (NITROGLYN) 2 % ointment 1 inch (1 inch Topical Given 07/15/18 0646)    Mobility walks

## 2018-07-15 NOTE — ED Triage Notes (Addendum)
Pt bib EMS and reports scrotum swelling and to the lower legs. EMS did not note any SOB but endorses anxiety. Pt a/o x 4. Upon speaking with pt, pt report some trouble breathing

## 2018-07-15 NOTE — ED Notes (Signed)
Patient appears to have severe testicular swelling that is impeding that patients ability to urinate. Patient continues to complain of pain, will give patient requested tylenol.

## 2018-07-15 NOTE — ED Provider Notes (Signed)
[facility]-EMERGENCY DEPT Provider Note   CSN: [id number] Arrival date & time: 07/15/18  0159  Time seen 5:25 AM   History   Chief Complaint Chief Complaint  Patient presents with  . Groin Swelling  . Shortness of Breath    HPI [name] is a 58 y.o. male.  HPI patient states 3 days ago he started having swelling in his legs, his groin, and his abdomen.  He states he is able to urinate.  He states he has shortness of breath and dyspnea on exertion.  He states he is coughing up white foamy fluid.  He complains of chest pain that started "recently" that is sharp and stabbing and radiates up into his throat.  He states he was in jail for 90 days and got out about 2 weeks ago.  He states he had swelling of his legs in the jail and they were giving him medication however when he was discharged they did not give him a prescription.  He states he does not take any of his prescribed blood pressure medication.  He denies any family history of heart problems.  He states high blood pressure runs in his family.  Patient states he has had this before but he is never been treated for it.  However when I look at his problem list he does have a diagnosis of congestive heart failure.  Looking at his chart he had a echocardiogram done March 16, 2018.  PCP [name], [name], NP   Past Medical History:  Diagnosis Date  . CHF (congestive heart failure) (HCC)   . Chronic foot pain   . Cocaine abuse (HCC)   . Diabetes mellitus without complication (HCC)   . Hepatitis C   . Homelessness   . Hypertension   . Neuropathy   . Polysubstance abuse (HCC)   . Schizophrenia [facility](HCC)     Patient Active Problem List   Diagnosis Date Noted  . Acute exacerbation of CHF (congestive heart failure) (HCC) 03/28/2018  . Acute on chronic heart failure (HCC) 03/16/2018  . Prostate enlargement 03/16/2018  . Aortic atherosclerosis (HCC) 03/16/2018  . Aneurysm of abdominal aorta (HCC)  03/16/2018  . Cocaine abuse with cocaine-induced mood disorder (HCC) 09/18/2017  . Chronic foot pain   . Schizoaffective disorder, bipolar type (HCC) 09/30/2016  . Suicidal thoughts   . Substance induced mood disorder (HCC) 03/13/2015  . Acute kidney failure (HCC) 01/26/2015  . Schizophrenia, paranoid type (HCC) 01/17/2015  . Suicidal ideation   . Drug hallucinosis (HCC) 10/08/2014  . Chronic paranoid schizophrenia (HCC) 09/07/2014  . 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 Acute CHF (congestive heart failure) (HCC) 07/29/2014  . Urinary retention   . Cocaine use disorder, severe, dependence (HCC)   . Cocaine abuse (HCC) 03/10/2014  . Essential hypertension, benign 03/28/2013  . Diabetes mellitus (HCC) 03/15/2013    History reviewed. No pertinent surgical history.      Home Medications    Prior to Admission medications   Medication Sig Start Date End Date Taking? Authorizing Provider  acetaminophen (TYLENOL) 325 MG tablet Take 2 tablets (650 mg total) by mouth every 6 (six) hours as needed. Patient not taking: Reported on 07/15/2018 04/01/18   Dietrich PatesKhatri, Hina, PA-C  aspirin EC 81 MG tablet Take 1 tablet (81 mg total) by mouth daily. Patient not taking: Reported on 07/15/2018 03/30/18 03/30/19  Dorrell, Cathleen Cortieborah N, MD  atorvastatin (LIPITOR) 40 MG tablet  Take 1 tablet (40 mg total) by mouth daily. Patient not taking: Reported on 07/15/2018 03/30/18   Dorrell, Cathleen Corti, MD  furosemide (LASIX) 20 MG tablet Take 1 tablet (20 mg total) by mouth daily. Patient not taking: Reported on 07/15/2018 03/30/18 03/30/19  Dorrell, Cathleen Corti, MD  lisinopril (PRINIVIL,ZESTRIL) 5 MG tablet Take 1 tablet (5 mg total) by mouth daily. Patient not taking: Reported on 07/15/2018 03/31/18   Dorrell, Cathleen Corti, MD    Family History Family History  Problem Relation Age of Onset  . Hypertension Other   . Diabetes Other     Social  History Social History   Tobacco Use  . Smoking status: Current Every Day Smoker    Packs/day: 1.00    Years: 20.00    Pack years: 20.00    Types: Cigarettes  . Smokeless tobacco: Current User  Substance Use Topics  . Alcohol use: Yes    Comment: Daily Drinker   . Drug use: Yes    Frequency: 7.0 times per week    Types: "Crack" cocaine, Cocaine, Marijuana    Comment: Cocaine tonight, Marijuana "a long time"     Allergies   Haldol [haloperidol]   Review of Systems Review of Systems  All other systems reviewed and are negative.    Physical Exam Updated Vital Signs BP (!) 156/106 (BP Location: Right Arm)   Pulse 93   Temp 98.5 F (36.9 C) (Oral)   Resp 18   Ht 5\' 9"  (1.753 m)   Wt 77.1 kg   SpO2 93%   BMI 25.10 kg/m   Vital signs normal except hypertension   Physical Exam Vitals signs and nursing note reviewed.  Constitutional:      General: He is not in acute distress.    Appearance: Normal appearance. He is well-developed. He is not ill-appearing or toxic-appearing.  HENT:     Head: Normocephalic and atraumatic.     Right Ear: External ear normal.     Left Ear: External ear normal.     Nose: Nose normal. No mucosal edema or rhinorrhea.     Mouth/Throat:     Dentition: No dental abscesses.     Pharynx: No uvula swelling.  Eyes:     Conjunctiva/sclera: Conjunctivae normal.     Pupils: Pupils are equal, round, and reactive to light.  Neck:     Musculoskeletal: Full passive range of motion without pain, normal range of motion and neck supple.  Cardiovascular:     Rate and Rhythm: Normal rate and regular rhythm.     Heart sounds: Normal heart sounds. No murmur. No friction rub. No gallop.   Pulmonary:     Effort: Respiratory distress present.     Breath sounds: Examination of the right-middle field reveals rales. Examination of the left-middle field reveals rales. Examination of the right-lower field reveals rales. Examination of the left-lower field  reveals rales. Rhonchi and rales present. No wheezing.     Comments: Patient seems to get short of breath when he talks. Chest:     Chest wall: No tenderness or crepitus.  Abdominal:     General: Bowel sounds are normal. There is distension.     Palpations: Abdomen is soft.     Tenderness: There is no abdominal tenderness. There is no guarding or rebound.  Genitourinary:    Comments: Patient has marked swelling of his scrotum, his scrotal skin is smooth, he swollen the size of a small watermelon Musculoskeletal: Normal range of motion.  General: No tenderness.     Right lower leg: Edema present.     Left lower leg: Edema present.     Comments: Patient is noted to have pitting edema up above his knees bilaterally.  Skin:    General: Skin is warm and dry.     Coloration: Skin is not pale.     Findings: No erythema or rash.  Neurological:     Mental Status: He is alert and oriented to person, place, and time.     Cranial Nerves: No cranial nerve deficit.  Psychiatric:        Mood and Affect: Mood is not anxious.        Speech: Speech normal.        Behavior: Behavior normal.      ED Treatments / Results  Labs (all labs ordered are listed, but only abnormal results are displayed) Results for orders placed or performed during the hospital encounter of 07/15/18  Comprehensive metabolic panel  Result Value Ref Range   Sodium 140 135 - 145 mmol/L   Potassium 4.0 3.5 - 5.1 mmol/L   Chloride 108 98 - 111 mmol/L   CO2 23 22 - 32 mmol/L   Glucose, Bld 236 (H) 70 - 99 mg/dL   BUN 30 (H) 6 - 20 mg/dL   Creatinine, Ser 4.090.89 0.61 - 1.24 mg/dL   Calcium 8.5 (L) 8.9 - 10.3 mg/dL   Total Protein 5.8 (L) 6.5 - 8.1 g/dL   Albumin 2.7 (L) 3.5 - 5.0 g/dL   AST 811104 (H) 15 - 41 U/L   ALT 86 (H) 0 - 44 U/L   Alkaline Phosphatase 146 (H) 38 - 126 U/L   Total Bilirubin 1.0 0.3 - 1.2 mg/dL   GFR calc non Af Amer >60 >60 mL/min   GFR calc Af Amer >60 >60 mL/min   Anion gap 9 5 - 15   Brain natriuretic peptide  Result Value Ref Range   B Natriuretic Peptide 1,372.1 (H) 0.0 - 100.0 pg/mL  CBC with Differential  Result Value Ref Range   WBC 8.9 4.0 - 10.5 K/uL   RBC 3.91 (L) 4.22 - 5.81 MIL/uL   Hemoglobin 10.6 (L) 13.0 - 17.0 g/dL   HCT 91.434.2 (L) 78.239.0 - 95.652.0 %   MCV 87.5 80.0 - 100.0 fL   MCH 27.1 26.0 - 34.0 pg   MCHC 31.0 30.0 - 36.0 g/dL   RDW 21.316.5 (H) 08.611.5 - 57.815.5 %   Platelets 325 150 - 400 K/uL   nRBC 0.0 0.0 - 0.2 %   Neutrophils Relative % 80 %   Neutro Abs 7.1 1.7 - 7.7 K/uL   Lymphocytes Relative 11 %   Lymphs Abs 1.0 0.7 - 4.0 K/uL   Monocytes Relative 8 %   Monocytes Absolute 0.7 0.1 - 1.0 K/uL   Eosinophils Relative 0 %   Eosinophils Absolute 0.0 0.0 - 0.5 K/uL   Basophils Relative 0 %   Basophils Absolute 0.0 0.0 - 0.1 K/uL   Immature Granulocytes 1 %   Abs Immature Granulocytes 0.05 0.00 - 0.07 K/uL  Troponin I - Once  Result Value Ref Range   Troponin I 0.04 (HH) <0.03 ng/mL   Laboratory interpretation all normal except mildly elevated troponin, very elevated BNP, hyperglycemia, elevation of LFTs    EKG EKG Interpretation  Date/Time:  Wednesday July 15 2018 02:14:45 EST Ventricular Rate:  93 PR Interval:    QRS Duration: 86 QT Interval:  377 QTC Calculation:  469 R Axis:   -20 Text Interpretation:  Sinus rhythm Borderline left axis deviation Anterior infarct, old Nonspecific T abnormalities, lateral leads No significant change since last tracing 28 Mar 2018 Confirmed by Devoria Albe ([id number]) on 07/15/2018 2:20:35 AM   Radiology Dg Chest 2 View  Result Date: 07/15/2018 CLINICAL DATA:  Shortness of breath, lower extremity swelling EXAM: CHEST - 2 VIEW COMPARISON:  03/28/2018 FINDINGS: Prominent interstitial markings. Mild lingular and right infrahilar opacities. The overall appearance suggests mild perihilar edema. No definite pleural effusions. No pneumothorax. Cardiomegaly. Mild degenerative changes of the visualized thoracolumbar  spine. IMPRESSION: Cardiomegaly with suspected mild perihilar edema. Electronically Signed   By: Charline Bills M.D.   On: 07/15/2018 03:41    Procedures .Critical Care Performed by: Devoria Albe, MD Authorized by: Devoria Albe, MD   Critical care provider statement:    Critical care time (minutes):  35   Critical care was time spent personally by me on the following activities:  Discussions with consultants, evaluation of patient's response to treatment, examination of patient, ordering and performing treatments and interventions, ordering and review of laboratory studies, ordering and review of radiographic studies, pulse oximetry, re-evaluation of patient's condition, obtaining history from patient or surrogate and review of old charts   (including critical care time)  Echocardiogram March 16, 2018 ------------------------------------------------------------------- Study Conclusions  - Left ventricle: The cavity size was normal. There was severe   concentric hypertrophy. Systolic function was moderately to   severely reduced. The estimated ejection fraction was in the   range of 30% to 35%. There is hypokinesis of the inferoseptal   myocardium. Doppler parameters are consistent with a reversible   restrictive pattern, indicative of decreased left ventricular   diastolic compliance and/or increased left atrial pressure (grade   3 diastolic dysfunction). - Aortic valve: Trileaflet; mildly thickened, mildly calcified   leaflets. There was mild regurgitation. - Mitral valve: There was moderate regurgitation. - Left atrium: The atrium was severely dilated.  Impressions:  - EF is reduced when compared to prior which was July 30, 2014  Medications Ordered in ED Medications  furosemide (LASIX) injection 60 mg (has no administration in time range)  nitroGLYCERIN (NITROGLYN) 2 % ointment 1 inch (has no administration in time range)     Initial Impression / Assessment and  Plan / ED Course  I have reviewed the triage vital signs and the nursing notes.  Pertinent labs & imaging results that were available during my care of the patient were reviewed by me and considered in my medical decision making (see chart for details).    Patient was given IV Lasix, his blood pressure was elevated he had nitroglycerin paste placed on his skin.  After reviewing patient's laboratory testing I decided to talk to the hospitalist about admission.  6:35 AM Dr. Toniann Fail, hospitalist will admit.   Final Clinical Impressions(s) / ED Diagnoses   Final diagnoses:  Anasarca    Plan admission  Devoria Albe, MD, Concha Pyo, MD 07/15/18 [phone]

## 2018-07-16 ENCOUNTER — Other Ambulatory Visit (HOSPITAL_COMMUNITY): Payer: Self-pay

## 2018-07-16 LAB — BASIC METABOLIC PANEL
Anion gap: 8 (ref 5–15)
BUN: 22 mg/dL — ABNORMAL HIGH (ref 6–20)
CO2: 29 mmol/L (ref 22–32)
Calcium: 8.4 mg/dL — ABNORMAL LOW (ref 8.9–10.3)
Chloride: 106 mmol/L (ref 98–111)
Creatinine, Ser: 0.91 mg/dL (ref 0.61–1.24)
GFR calc Af Amer: >60 mL/min (ref >60)
GFR calc non Af Amer: >60 mL/min (ref >60)
Glucose, Bld: 262 mg/dL — ABNORMAL HIGH (ref 70–99)
POTASSIUM: 3.9 mmol/L (ref 3.5–5.1)
Sodium: 143 mmol/L (ref 135–145)

## 2018-07-16 NOTE — Plan of Care (Signed)
  Problem: Clinical Measurements: Goal: Diagnostic test results will improve Outcome: Progressing Goal: Respiratory complications will improve Outcome: Progressing Goal: Cardiovascular complication will be avoided Outcome: Progressing   Problem: Safety: Goal: Ability to remain free from injury will improve Outcome: Progressing   

## 2018-07-16 NOTE — Progress Notes (Signed)
Foley placed after I&O cath and patient found to have > 1000 cc in bladder. Prior to placement, patient was agitated, getting out of bed, straining to urinate, and had complaints of lower abdominal pain.

## 2018-07-16 NOTE — Progress Notes (Signed)
[facility] PROGRESS NOTE  [name]:[id number] DOB: August 29, 1960 DOA: 07/15/2018 PCP: [name], NP   Narrative:  58 year old male Hep C, homelessness, diabetes mellitus type 2, HTN, polysubstance abuse with cocaine, schizophrenia, prior bladder obstruction secondary to BPH Known chronic smoker He was admitted 9/28 through 9/30 by internal medicine-found to have EF of 30 to 35% with systolic heart failure in addition to grade 3 diastolic heart failure placed on lisinopril 5, Lasix 20 and 30-day supply given he was also started on Flomax at that time Recently discharged from jail 14 days prior after being in jail for 90 days-in the jail was given a fluid pill which he has stopped taking and did not get a prescription he smoked cocaine day before yesterday  ED work-up revealed mild renal insufficiency 30/0.8 BUN/creatinine over baseline 17/0.6, BNP 1372, troponin 0.04, CBC normal, A1c 7.0, TSH was 0.5   A & Plan Acute decompensated systolic and diastolic heart failure Diurese lasix IV 40 bd, labs am, suspect will not be adherent to fluid or dietary restrictions Smoker Counseled regarding cessation doubt this will occur Cocaine abuse Counseled regarding the same-patient has poor insight does not wish to quit Diabetes mellitus type 2 A1c 7.0 Suspect that he has had diabetes as well but he tells me "I eat what I want, I love cake"-he becomes pretty irritable when I explained to him the role of diet and heart failure-I will place him on low-dose glipizide in the a.m. but I doubt he will comply with fingersticks and sliding scale coverage Mild obesity Low TSH Low normal at this time-monitor Elevated troponin Not complaining of any chest pain-he ideally would benefit from cardiac catheterization but we are limited in terms of what meds to use as he does use cocaine-at this juncture I will monitor him if he decides to stay as he is left in the past AGAINST MEDICAL ADVICE  we can consider working up for cardiac cath Renal insufficiency/AKI on admission Stage I CKD likely precipitated by cardiorenal syndrome-slightly improved Hep C  DVT Lovenox code Status: Presumed full communication: None disposition Plan: Likely discharged home once stabilized in the next several days   Mahala Menghini, MD  [facility] Via [employer] app OR [email or web address] 7PM-7AM contact night coverage as above 07/16/2018, 7:49 AM  LOS: 1 day   Consultants:  No  Procedures:   Antimicrobials:  No  Interval history/Subjective: Awake irritable no distress Wishing to eat whatever he wants and asking me to place him on regular diet No chest pain, no fever, when he got up earlier today he felt uncomfortable walking around He also has significant scrotal pain and discomfort  Objective:  Vitals:  Vitals:   07/15/18 2147 07/16/18 0611  BP: (!) 170/112 (!) 158/97  Pulse: 92 95  Resp: (!) 22 20  Temp: 98.4 F (36.9 C) 98.7 F (37.1 C)  SpO2: 91% 98%    Exam:  EOMI NCAT no distress no JVD no bruits Chest: Clear no added sound no rales no rhonchi S1-S2 no murmur rub or gallop displaced PMI however Abdomen soft nontender-scrotum is very swollen and painful he has a Foley catheter in place 2+ lower extremity edema   I have personally reviewed the following:  DATA   Labs:  BUN/creatinine down from 30/0 0.822/0.9  HDL 24 LDL 96  Hemoglobin 86.5 platelets 335  I/O -990 weight is 85 kg has not been weighed today  Imaging studies:  None today  Medical tests:  n  Test discussed with performing physician:  n  Decision to obtain old records:  n  Review and summation of old records:  n  Scheduled Meds: . aspirin EC  81 mg Oral Daily  . atorvastatin  40 mg Oral Daily  . enoxaparin (LOVENOX) injection  40 mg Subcutaneous Q24H  . furosemide  40 mg Intravenous Q12H  . lisinopril  5 mg Oral Daily  . LORazepam  1 mg Oral Once  . nicotine  21 mg Transdermal  Daily  . potassium chloride  20 mEq Oral BID  . sodium chloride flush  3 mL Intravenous Q12H   Continuous Infusions: . sodium chloride      Principal Problem:   Acute CHF (congestive heart failure) (HCC) Active Problems:   Diabetes mellitus (HCC)   Essential hypertension, benign   Chronic paranoid schizophrenia (HCC)   CHF (congestive heart failure) (HCC)   LOS: 1 day

## 2018-07-16 NOTE — Progress Notes (Signed)
Inpatient Diabetes Program Recommendations  AACE/ADA: New Consensus Statement on Inpatient Glycemic Control (2015)  Target Ranges:  Prepandial:   less than 140 mg/dL      Peak postprandial:   less than 180 mg/dL (1-2 hours)      Critically ill patients:  140 - 180 mg/dL   Lab Results  Component Value Date   GLUCAP 139 (H) 03/30/2018   HGBA1C 7.0 (H) 07/15/2018    Review of Glycemic Control  Diabetes history: Type 2 Outpatient Diabetes medications: none noted Current orders for Inpatient glycemic control: none  Inpatient Diabetes Program Recommendations:   Noted that the lab glucose this am was 262 mg/dl. Noted that HgbA1C is 7%. Does have history of diabetes. Recommend starting Novolog SENSITIVE correction scale TID & HS while in the hospital. Will need to check CBGs TID & HS. Will continue to follow while in the hospital.  Smith Mince RN BSN CDE Diabetes Coordinator Pager: [phone]  8am-5pm

## 2018-07-17 ENCOUNTER — Inpatient Hospital Stay (HOSPITAL_COMMUNITY): Payer: Medicare Other

## 2018-07-17 LAB — CBC WITH DIFFERENTIAL/PLATELET
Abs Immature Granulocytes: 0.08 10*3/uL — ABNORMAL HIGH (ref 0.00–0.07)
Basophils Absolute: 0 10*3/uL (ref 0.0–0.1)
Basophils Relative: 0 %
Eosinophils Absolute: 0.1 10*3/uL (ref 0.0–0.5)
Eosinophils Relative: 1 %
HCT: 36.4 % — ABNORMAL LOW (ref 39.0–52.0)
Hemoglobin: 11.1 g/dL — ABNORMAL LOW (ref 13.0–17.0)
Immature Granulocytes: 1 %
Lymphocytes Relative: 11 %
Lymphs Abs: 1.3 10*3/uL (ref 0.7–4.0)
MCH: 26.5 pg (ref 26.0–34.0)
MCHC: 30.5 g/dL (ref 30.0–36.0)
MCV: 86.9 fL (ref 80.0–100.0)
Monocytes Absolute: 0.8 10*3/uL (ref 0.1–1.0)
Monocytes Relative: 7 %
Neutro Abs: 9.5 10*3/uL — ABNORMAL HIGH (ref 1.7–7.7)
Neutrophils Relative %: 80 %
Platelets: 358 10*3/uL (ref 150–400)
RBC: 4.19 MIL/uL — AB (ref 4.22–5.81)
RDW: 16.7 % — ABNORMAL HIGH (ref 11.5–15.5)
WBC: 11.7 10*3/uL — ABNORMAL HIGH (ref 4.0–10.5)
nRBC: 0 % (ref 0.0–0.2)

## 2018-07-17 LAB — BASIC METABOLIC PANEL
Anion gap: 10 (ref 5–15)
BUN: 25 mg/dL — ABNORMAL HIGH (ref 6–20)
CO2: 28 mmol/L (ref 22–32)
Calcium: 8.4 mg/dL — ABNORMAL LOW (ref 8.9–10.3)
Chloride: 101 mmol/L (ref 98–111)
Creatinine, Ser: 0.84 mg/dL (ref 0.61–1.24)
GFR calc Af Amer: >60 mL/min (ref >60)
GFR calc non Af Amer: >60 mL/min (ref >60)
Glucose, Bld: 325 mg/dL — ABNORMAL HIGH (ref 70–99)
POTASSIUM: 3.7 mmol/L (ref 3.5–5.1)
Sodium: 139 mmol/L (ref 135–145)

## 2018-07-17 LAB — GLUCOSE, CAPILLARY: GLUCOSE-CAPILLARY: 309 mg/dL — AB (ref 70–99)

## 2018-07-17 IMAGING — CR DG CHEST 2V
2 series · 2 of 2 positions shown · non-contrast
Comparison: December 19, 2017

CLINICAL DATA: Chest pain

EXAM:
CHEST - 2 VIEW

[chest lat]
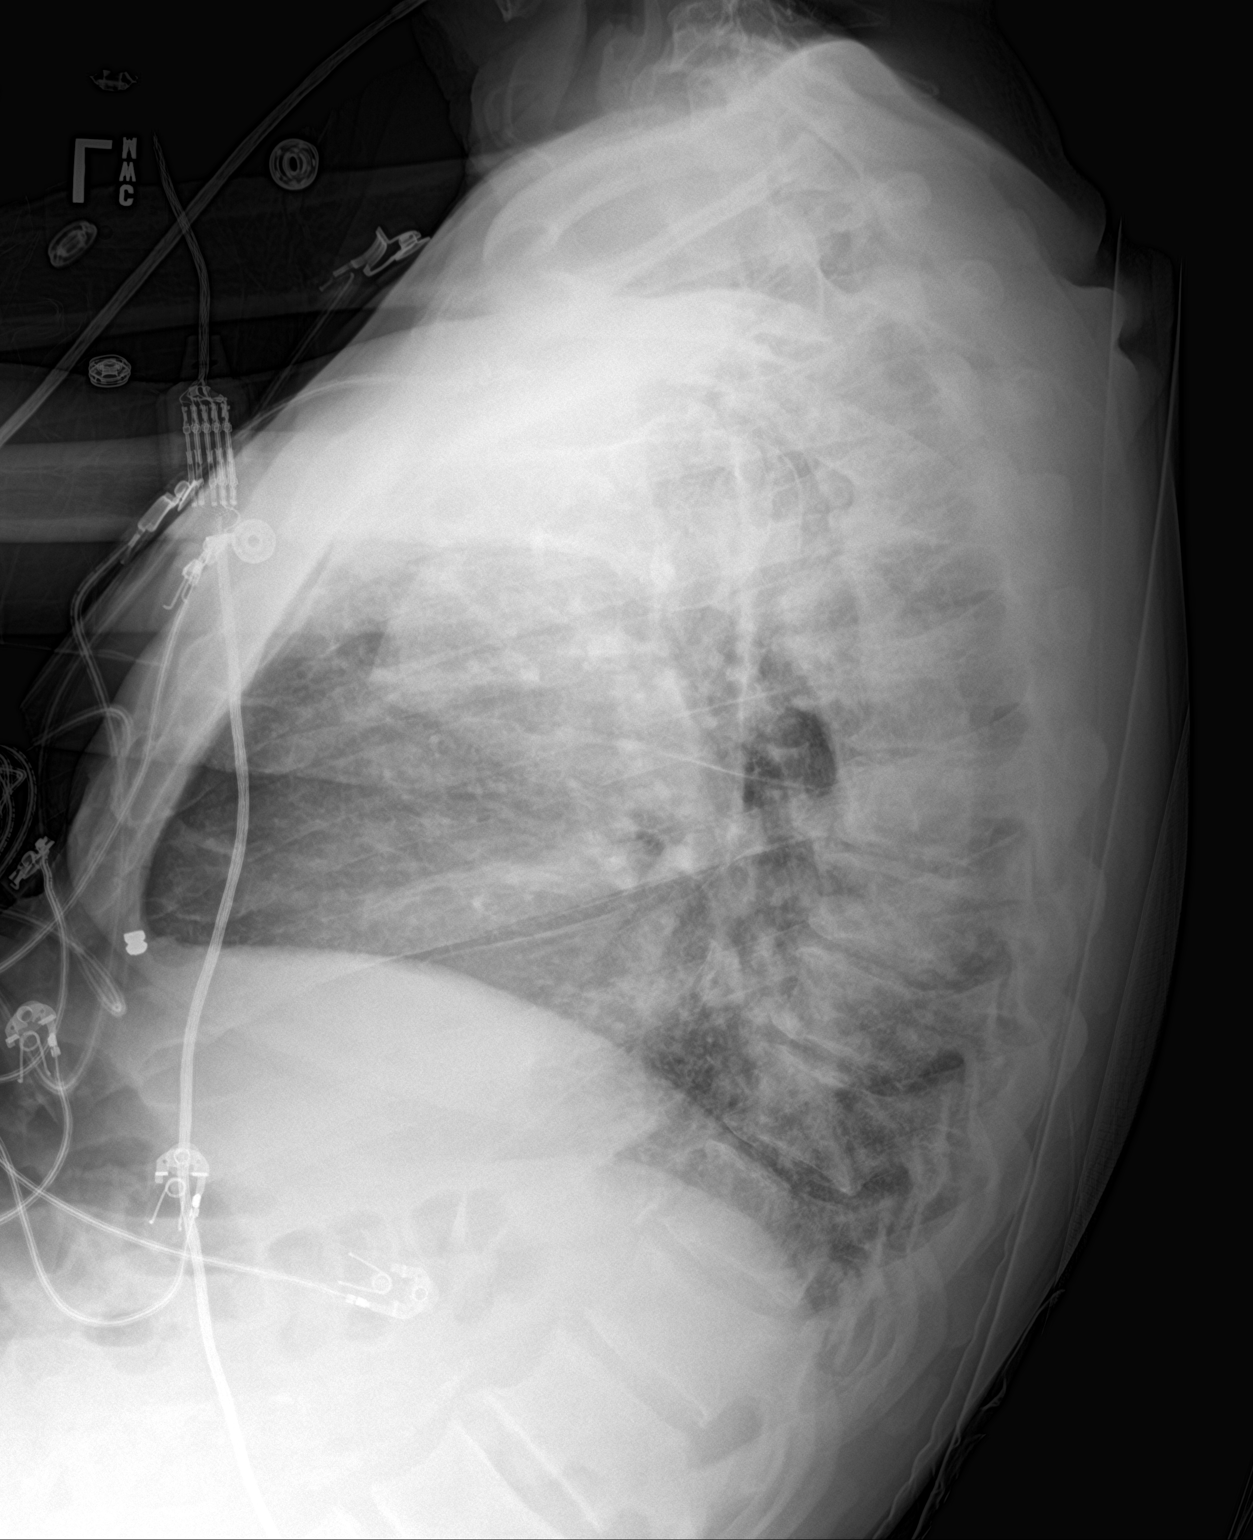

[chest ap]
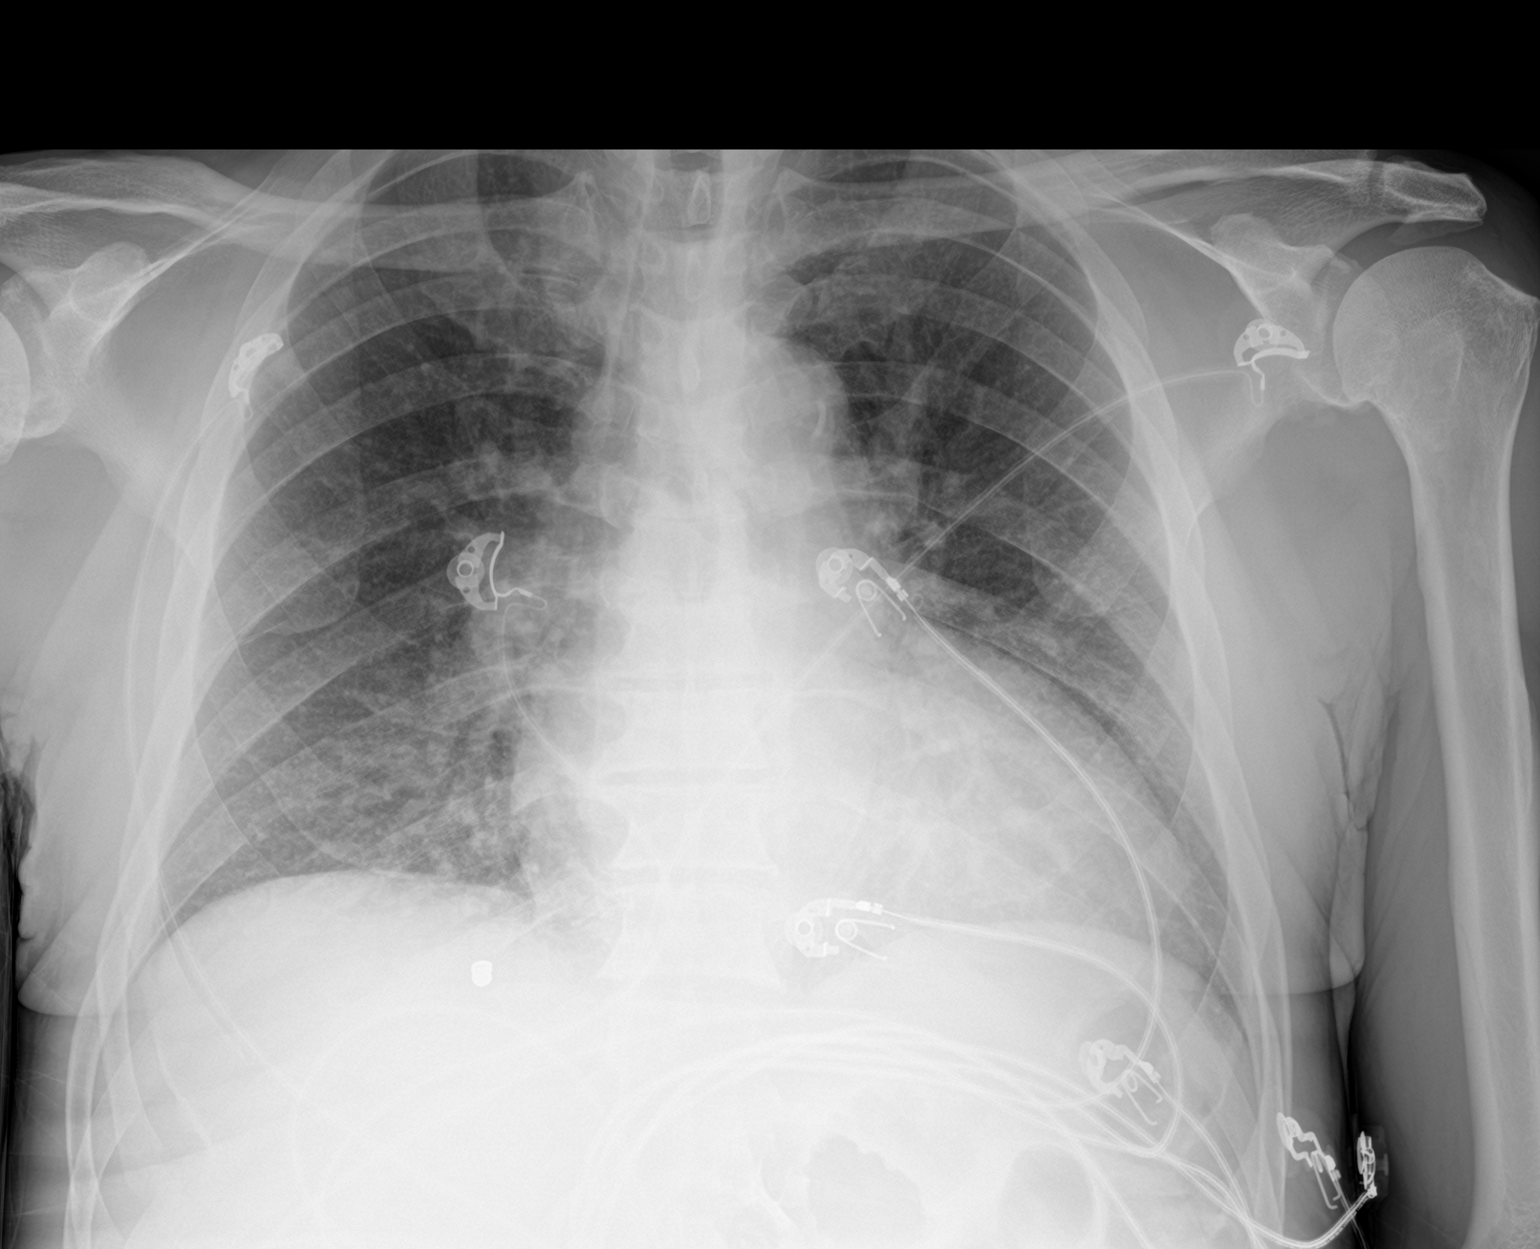

[2 of 2 positions shown; findings below may reference images not displayed]

FINDINGS: There is no edema or consolidation. Heart is mildly enlarged with
pulmonary vascularity normal. No adenopathy. There is aortic
atherosclerosis. There is mild degenerative change in the thoracic
spine.
IMPRESSION: Heart mildly enlarged. No edema or consolidation. There is aortic
atherosclerosis.

Aortic Atherosclerosis (KM0GY-AMD.D).

## 2018-07-17 MED ORDER — INSULIN ASPART 100 UNIT/ML ~~LOC~~ SOLN
0.0000 [IU] | Freq: Three times a day (TID) | SUBCUTANEOUS | Status: DC
  Administered 2018-07-17: 7 [IU] via SUBCUTANEOUS

## 2018-07-17 MED ORDER — INSULIN DETEMIR 100 UNIT/ML ~~LOC~~ SOLN
10.0000 [IU] | Freq: Every day | SUBCUTANEOUS | Status: DC
  Filled 2018-07-17: qty 0.1

## 2018-07-17 MED ORDER — INSULIN ASPART 100 UNIT/ML ~~LOC~~ SOLN
3.0000 [IU] | Freq: Three times a day (TID) | SUBCUTANEOUS | Status: DC
  Administered 2018-07-17: 3 [IU] via SUBCUTANEOUS

## 2018-07-17 MED ORDER — GLIPIZIDE ER 5 MG PO TB24: 5.0000 mg | ORAL_TABLET | Freq: Every day | ORAL | Status: DC

## 2018-07-17 NOTE — Progress Notes (Addendum)
[facility] PROGRESS NOTE  [name] UPJ:[id number] DOB: 06-03-1961 DOA: 07/15/2018 PCP: [name], NP   Narrative:  58 year old male Hep C, homelessness, diabetes mellitus type 2, HTN, polysubstance abuse with cocaine, schizophrenia, prior bladder obstruction secondary to BPH Known chronic smoker He was admitted 9/28 through 9/30 by internal medicine-found to have EF of 30 to 35% with systolic heart failure in addition to grade 3 diastolic heart failure placed on lisinopril 5, Lasix 20 and 30-day supply given he was also started on Flomax at that time Recently discharged from jail 14 days prior after being in jail for 90 days-in the jail was given a fluid pill which he has stopped taking and did not get a prescription he smoked cocaine day before yesterday  ED work-up revealed mild renal insufficiency 30/0.8 BUN/creatinine over baseline 17/0.6, BNP 1372, troponin 0.04, CBC normal, A1c 7.0, TSH was 0.5   A & Plan Acute decompensated systolic and diastolic heart failure Diurese lasix IV 40 bd, labs am, tried x2 days to discuss adherence to fluid restrictions-he understands but it is unlikely he will comply Continue lisinopril 5 daily Smoker Counseled regarding cessation doubt this will occur-continue nicotine patch at this time Cocaine abuse Counseled regarding the same-patient has poor insight does not wish to quit Diabetes mellitus type 2 A1c 7.0 Tells me " I did not come here for my diabetes I came in for the fluid" when I tried to discuss with him risks of elevated blood sugar and change in diet he becomes borderline belligerent He is however amenable to using sliding scale coverage and getting 4 times daily checks and was okay starting on Glucotrol 5 mg this morning and in addition to sliding scale-I have added long-acting 10 units I suspect it will be very difficult to control his diabetes and I would prefer that he stay in the hospital get diuresed and if he  wishes to have a regular diet I would leave this aspect of his care alone Mild obesity/hyperlipidemia Continue atorvastatin 40 daily Low TSH Low normal at this time-monitor Elevated troponin Not complaining of any chest pain-he ideally would benefit from cardiac catheterization but we are limited in terms of what meds to use as he does use cocaine-he is not a candidate at this time for cardiac cath as he continues to decline and refused certain aspects of care and is probably going to exhibit noncompliance Renal insufficiency/AKI on admission Stage I CKD likely precipitated by cardiorenal syndrome-slightly improved Hep C  DVT Lovenox code Status: Presumed full communication: None disposition Plan: Likely discharged home once stabilized in the next several days   Mahala Menghini, MD  [facility] Via [employer] app OR [email or web address] 7PM-7AM contact night coverage as above 07/17/2018, 11:16 AM  LOS: 2 days   Consultants:  No  Procedures:   Antimicrobials:  No  Interval history/Subjective:  Belligerent at times discussing diet and seems to get upset when mention of changing diet comes up Otherwise feels overall better still has some pain in his groin No fever no chills   Objective:  Vitals:  Vitals:   07/17/18 0631 07/17/18 0655  BP: (!) 166/103 (!) 150/92  Pulse: 98   Resp: 20   Temp: 98 F (36.7 C)   SpO2: (!) 89%     Exam:  Alert disheveled unkempt No icterus no pallor Mild JVD with swelling of collateral veins in the upper extremity and chest wall Abdomen is soft no rebound Scrotal swelling is down from size of up  grapefruit to the size of a large orange Mild lower extremity edema  I have personally reviewed the following:  DATA   Labs:  BUN/creatinine 25/0.8 down from 22/0.9  WBC 11.7 up from 7.9 hemoglobin 11.1  I/O -6.3 L, weight is inaccurate now is actually gone up from 85 to 88 pounds  Imaging studies:  None today  Medical tests:  n    Test discussed with performing physician:  n  Decision to obtain old records:  n  Review and summation of old records:  n  Scheduled Meds: . aspirin EC  81 mg Oral Daily  . atorvastatin  40 mg Oral Daily  . enoxaparin (LOVENOX) injection  40 mg Subcutaneous Q24H  . furosemide  40 mg Intravenous Q12H  . [START ON 07/18/2018] glipiZIDE  5 mg Oral Q breakfast  . insulin aspart  0-9 Units Subcutaneous TID WC  . insulin aspart  3 Units Subcutaneous TID WC  . insulin detemir  10 Units Subcutaneous QHS  . lisinopril  5 mg Oral Daily  . LORazepam  1 mg Oral Once  . nicotine  21 mg Transdermal Daily  . potassium chloride  20 mEq Oral BID  . sodium chloride flush  3 mL Intravenous Q12H   Continuous Infusions: . sodium chloride      Principal Problem:   Acute CHF (congestive heart failure) (HCC) Active Problems:   Diabetes mellitus (HCC)   Essential hypertension, benign   Chronic paranoid schizophrenia (HCC)   CHF (congestive heart failure) (HCC)   LOS: 2 days

## 2018-07-17 NOTE — Procedures (Signed)
Echo attempted. Patient in chair and eating. Will attempt again later.

## 2018-07-17 NOTE — Care Management Important Message (Signed)
Important Message  Patient Details  Name: [name] MRN: [id number] Date of Birth: September 06, 1960   Medicare Important Message Given:  Yes    Mikal Blasdell 07/17/2018, 9:25 AM

## 2018-07-17 NOTE — Care Management Note (Signed)
Case Management Note  Patient Details  Name: [name] MRN: [id number] Date of Birth: 31-Aug-1960  Subjective/Objective:                    Action/Plan:Pt left AMA  Expected Discharge Date:  (unknown)               Expected Discharge Plan:  Against Medical Advice  In-House Referral:     Discharge planning Services  CM Consult, Indigent [facility] Clinic  Post Acute Care Choice:    Choice offered to:  Patient  DME Arranged:    DME Agency:     HH Arranged:    HH Agency:     Status of Service:  Completed, signed off  If discussed at [employer] of Stay Meetings, dates discussed:    Additional CommentsGeni Bers, RN 07/17/2018, 2:41 PM

## 2018-07-17 NOTE — Discharge Summary (Signed)
Physician Discharge Summary  [name] BLT:[id number] DOB: March 30, 1961 DOA: 07/15/2018  PCP: [name], NP  Admit date: 07/15/2018 Discharge date: 07/17/2018  Time spent: 10 minutes  Recommendations for Outpatient Follow-up:  1. None-he left AMA  Discharge Diagnoses:  Principal Problem:   Acute CHF (congestive heart failure) (HCC) Active Problems:   Diabetes mellitus (HCC)   Essential hypertension, benign   Chronic paranoid schizophrenia (HCC)   CHF (congestive heart failure) (HCC)   Discharge Condition: gaurded  Diet recommendation: none  Filed Weights   07/15/18 0217 07/16/18 0500 07/17/18 0628  Weight: 77.1 kg 85.9 kg 88.5 kg    History of present illness:  58 year old male Hep C, homelessness, diabetes mellitus type 2, HTN, polysubstance abuse with cocaine, schizophrenia, prior bladder obstruction secondary to BPH Known chronic smoker He was admitted 9/28 through 9/30 by internal medicine-found to have EF of 30 to 35% with systolic heart failure in addition to grade 3 diastolic heart failure placed on lisinopril 5, Lasix 20 and 30-day supply given he was also started on Flomax at that time Recently discharged from jail 14 days prior after being in jail for 90 days-in the jail was given a fluid pill which he has stopped taking and did not get a prescription he smoked cocaine day before yesterday  He was admitted and started on high dose diuretics for LE swelling and painful scrotal swelling He was noncompliant with fluid and salt management, insisted on a regular diet and had CBG in 300 range He insisted on being discharged home 1/17 despite our discussions with him about need for further IV diuresis and hence will likely be coming back to an emergency room sometime soon He has a very poor prognosis as he has undefined cardiac anatomy and continues to use cocaine making medical management with Beta blockers risky  He was not given medication on discharge       See my progess note 1/17  Discharge Exam:  none  Discharge Instructions  none  Allergies  Allergen Reactions  . Haldol [Haloperidol] Other (See Comments)    Muscle spasms, loss of voluntary movement. However, pt has taken Thorazine on multiple occasions with no adverse effects.       The results of significant diagnostics from this hospitalization (including imaging, microbiology, ancillary and laboratory) are listed below for reference.    Significant Diagnostic Studies: Dg Chest 2 View  Result Date: 07/15/2018 CLINICAL DATA:  Shortness of breath, lower extremity swelling EXAM: CHEST - 2 VIEW COMPARISON:  03/28/2018 FINDINGS: Prominent interstitial markings. Mild lingular and right infrahilar opacities. The overall appearance suggests mild perihilar edema. No definite pleural effusions. No pneumothorax. Cardiomegaly. Mild degenerative changes of the visualized thoracolumbar spine. IMPRESSION: Cardiomegaly with suspected mild perihilar edema. Electronically Signed   By: Charline Bills M.D.   On: 07/15/2018 03:41    Microbiology: No results found for this or any previous visit (from the past 240 hour(s)).   Labs: Basic Metabolic Panel: Recent Labs  Lab 07/15/18 0410 07/15/18 1847 07/16/18 0422 07/17/18 0408  NA 140  --  143 139  K 4.0  --  3.9 3.7  CL 108  --  106 101  CO2 23  --  29 28  GLUCOSE 236*  --  262* 325*  BUN 30*  --  22* 25*  CREATININE 0.89 0.89 0.91 0.84  CALCIUM 8.5*  --  8.4* 8.4*   Liver Function Tests: Recent Labs  Lab 07/15/18 0410  AST 104*  ALT  86*  ALKPHOS 146*  BILITOT 1.0  PROT 5.8*  ALBUMIN 2.7*   No results for input(s): LIPASE, AMYLASE in the last 168 hours. No results for input(s): AMMONIA in the last 168 hours. CBC: Recent Labs  Lab 07/15/18 0410 07/15/18 1847 07/17/18 0408  WBC 8.9 7.9 11.7*  NEUTROABS 7.1  --  9.5*  HGB 10.6* 10.4* 11.1*  HCT 34.2* 34.0* 36.4*  MCV 87.5 87.9 86.9  PLT 325 335 358    Cardiac Enzymes: Recent Labs  Lab 07/15/18 0410  TROPONINI 0.04*   BNP: BNP (last 3 results) Recent Labs    03/16/18 0648 03/28/18 0309 07/15/18 0410  BNP 351.5* 410.3* 1,372.1*    ProBNP (last 3 results) No results for input(s): PROBNP in the last 8760 hours.  CBG: Recent Labs  Lab 07/17/18 1204  GLUCAP 309*       Signed:  Rhetta Mura MD   [facility] 07/17/2018, 3:38 PM

## 2018-07-17 NOTE — Care Management Note (Signed)
Case Management Note  Patient Details  Name: [name] MRN: [id number] Date of Birth: Oct 27, 1960  Subjective/Objective:                    Action/Plan:Pt states that he has an apartment and use [facility] [employer]) for meds and medical examines.    Expected Discharge Date:  (unknown)               Expected Discharge Plan:  Home/Self Care  In-House Referral:     Discharge planning Services  CM Consult, Indigent Health Clinic  Post Acute Care Choice:    Choice offered to:  Patient  DME Arranged:    DME Agency:     HH Arranged:    HH Agency:     Status of Service:  Completed, signed off  If discussed at [employer], dates discussed:    Additional CommentsGeni Bers, RN 07/17/2018, 10:59 AM

## 2018-07-17 NOTE — Progress Notes (Signed)
Patient states that he wants to be discharged.  MD made aware of patient request.  MD personally spoke to patient about the risks, including fluid retention, serious illness and death, of leaving and the need for IV diuretics.  Patient stated that he would stay and then called the nurse back into the room 30 minutes later and requested to be discharged.  Discussed consequences of leaving without completing treatment and Notified MD again of patient requests.  Patient still insists on leaving AMA.  IV and indwelling catheter removed.  Tele removed.  AMA form signed.  Patient given bus pass before leaving the floor.

## 2018-07-18 ENCOUNTER — Inpatient Hospital Stay (HOSPITAL_COMMUNITY): Payer: Medicare Other

## 2018-07-18 ENCOUNTER — Inpatient Hospital Stay (HOSPITAL_COMMUNITY)
Admission: EM | Admit: 2018-07-18 | Discharge: 2018-07-21 | DRG: 292 | Disposition: A | Discharge status: Home / Self Care | Payer: Medicare Other | Attending: Internal Medicine | Admitting: Internal Medicine

## 2018-07-18 ENCOUNTER — Other Ambulatory Visit: Payer: Self-pay

## 2018-07-18 ENCOUNTER — Encounter (HOSPITAL_COMMUNITY): Payer: Self-pay | Admitting: Emergency Medicine

## 2018-07-18 DIAGNOSIS — R601 Generalized edema: Secondary | ICD-10-CM | POA: Diagnosis present

## 2018-07-18 DIAGNOSIS — F1410 Cocaine abuse, uncomplicated: Secondary | ICD-10-CM | POA: Diagnosis present

## 2018-07-18 DIAGNOSIS — I714 Abdominal aortic aneurysm, without rupture: Secondary | ICD-10-CM | POA: Diagnosis present

## 2018-07-18 DIAGNOSIS — E1165 Type 2 diabetes mellitus with hyperglycemia: Secondary | ICD-10-CM | POA: Diagnosis present

## 2018-07-18 DIAGNOSIS — Z590 Homelessness: Secondary | ICD-10-CM | POA: Diagnosis not present

## 2018-07-18 DIAGNOSIS — I5023 Acute on chronic systolic (congestive) heart failure: Secondary | ICD-10-CM | POA: Diagnosis present

## 2018-07-18 DIAGNOSIS — B1920 Unspecified viral hepatitis C without hepatic coma: Secondary | ICD-10-CM | POA: Diagnosis present

## 2018-07-18 DIAGNOSIS — F200 Paranoid schizophrenia: Secondary | ICD-10-CM | POA: Diagnosis present

## 2018-07-18 DIAGNOSIS — N400 Enlarged prostate without lower urinary tract symptoms: Secondary | ICD-10-CM | POA: Diagnosis present

## 2018-07-18 DIAGNOSIS — F17210 Nicotine dependence, cigarettes, uncomplicated: Secondary | ICD-10-CM | POA: Diagnosis present

## 2018-07-18 DIAGNOSIS — E119 Type 2 diabetes mellitus without complications: Secondary | ICD-10-CM

## 2018-07-18 DIAGNOSIS — Z7151 Drug abuse counseling and surveillance of drug abuser: Secondary | ICD-10-CM

## 2018-07-18 DIAGNOSIS — I509 Heart failure, unspecified: Secondary | ICD-10-CM

## 2018-07-18 DIAGNOSIS — Z794 Long term (current) use of insulin: Secondary | ICD-10-CM | POA: Diagnosis not present

## 2018-07-18 DIAGNOSIS — I110 Hypertensive heart disease with heart failure: Principal | ICD-10-CM | POA: Diagnosis present

## 2018-07-18 DIAGNOSIS — Z9111 Patient's noncompliance with dietary regimen: Secondary | ICD-10-CM

## 2018-07-18 DIAGNOSIS — Z79899 Other long term (current) drug therapy: Secondary | ICD-10-CM | POA: Diagnosis not present

## 2018-07-18 DIAGNOSIS — Z7289 Other problems related to lifestyle: Secondary | ICD-10-CM

## 2018-07-18 DIAGNOSIS — E1142 Type 2 diabetes mellitus with diabetic polyneuropathy: Secondary | ICD-10-CM | POA: Diagnosis present

## 2018-07-18 DIAGNOSIS — I700 Atherosclerosis of aorta: Secondary | ICD-10-CM | POA: Diagnosis present

## 2018-07-18 DIAGNOSIS — E1169 Type 2 diabetes mellitus with other specified complication: Secondary | ICD-10-CM | POA: Diagnosis not present

## 2018-07-18 DIAGNOSIS — Z9119 Patient's noncompliance with other medical treatment and regimen: Secondary | ICD-10-CM | POA: Diagnosis not present

## 2018-07-18 DIAGNOSIS — Z885 Allergy status to narcotic agent status: Secondary | ICD-10-CM | POA: Diagnosis not present

## 2018-07-18 DIAGNOSIS — Z7982 Long term (current) use of aspirin: Secondary | ICD-10-CM

## 2018-07-18 DIAGNOSIS — N5089 Other specified disorders of the male genital organs: Secondary | ICD-10-CM | POA: Diagnosis present

## 2018-07-18 DIAGNOSIS — R52 Pain, unspecified: Secondary | ICD-10-CM

## 2018-07-18 DIAGNOSIS — I10 Essential (primary) hypertension: Secondary | ICD-10-CM

## 2018-07-18 DIAGNOSIS — G8929 Other chronic pain: Secondary | ICD-10-CM | POA: Diagnosis present

## 2018-07-18 DIAGNOSIS — Z833 Family history of diabetes mellitus: Secondary | ICD-10-CM

## 2018-07-18 DIAGNOSIS — Z8249 Family history of ischemic heart disease and other diseases of the circulatory system: Secondary | ICD-10-CM

## 2018-07-18 LAB — RAPID URINE DRUG SCREEN, HOSP PERFORMED
Amphetamines: NOT DETECTED
Barbiturates: NOT DETECTED
Benzodiazepines: NOT DETECTED
Cocaine: POSITIVE — AB
OPIATES: NOT DETECTED
Tetrahydrocannabinol: NOT DETECTED

## 2018-07-18 LAB — BASIC METABOLIC PANEL
Anion gap: 10 (ref 5–15)
BUN: 25 mg/dL — ABNORMAL HIGH (ref 6–20)
CO2: 25 mmol/L (ref 22–32)
Calcium: 8.6 mg/dL — ABNORMAL LOW (ref 8.9–10.3)
Chloride: 103 mmol/L (ref 98–111)
Creatinine, Ser: 0.86 mg/dL (ref 0.61–1.24)
GFR calc Af Amer: >60 mL/min (ref >60)
GFR calc non Af Amer: >60 mL/min (ref >60)
Glucose, Bld: 315 mg/dL — ABNORMAL HIGH (ref 70–99)
Potassium: 4 mmol/L (ref 3.5–5.1)
Sodium: 138 mmol/L (ref 135–145)

## 2018-07-18 LAB — CBC WITH DIFFERENTIAL/PLATELET
Abs Immature Granulocytes: 0.07 10*3/uL (ref 0.00–0.07)
Basophils Absolute: 0 10*3/uL (ref 0.0–0.1)
Basophils Relative: 0 %
Eosinophils Absolute: 0.1 10*3/uL (ref 0.0–0.5)
Eosinophils Relative: 1 %
HCT: 33.7 % — ABNORMAL LOW (ref 39.0–52.0)
Hemoglobin: 10.6 g/dL — ABNORMAL LOW (ref 13.0–17.0)
Immature Granulocytes: 1 %
Lymphocytes Relative: 8 %
Lymphs Abs: 1 10*3/uL (ref 0.7–4.0)
MCH: 27.1 pg (ref 26.0–34.0)
MCHC: 31.5 g/dL (ref 30.0–36.0)
MCV: 86.2 fL (ref 80.0–100.0)
Monocytes Absolute: 0.8 10*3/uL (ref 0.1–1.0)
Monocytes Relative: 6 %
Neutro Abs: 11.1 10*3/uL — ABNORMAL HIGH (ref 1.7–7.7)
Neutrophils Relative %: 84 %
Platelets: 347 10*3/uL (ref 150–400)
RBC: 3.91 MIL/uL — ABNORMAL LOW (ref 4.22–5.81)
RDW: 16.6 % — ABNORMAL HIGH (ref 11.5–15.5)
WBC: 13 10*3/uL — ABNORMAL HIGH (ref 4.0–10.5)
nRBC: 0 % (ref 0.0–0.2)

## 2018-07-18 LAB — HEMOGLOBIN A1C
Hgb A1c MFr Bld: 7.3 % — ABNORMAL HIGH (ref 4.8–5.6)
Mean Plasma Glucose: 162.81 mg/dL

## 2018-07-18 LAB — GLUCOSE, CAPILLARY
Glucose-Capillary: 385 mg/dL — ABNORMAL HIGH (ref 70–99)
Glucose-Capillary: 101 mg/dL — ABNORMAL HIGH (ref 70–99)
Glucose-Capillary: 212 mg/dL — ABNORMAL HIGH (ref 70–99)
Glucose-Capillary: 386 mg/dL — ABNORMAL HIGH (ref 70–99)

## 2018-07-18 LAB — BRAIN NATRIURETIC PEPTIDE: B Natriuretic Peptide: 948.3 pg/mL — ABNORMAL HIGH (ref 0.0–100.0)

## 2018-07-18 LAB — MRSA PCR SCREENING: MRSA by PCR: NEGATIVE

## 2018-07-18 MED ORDER — ASPIRIN EC 81 MG PO TBEC
81.0000 mg | DELAYED_RELEASE_TABLET | Freq: Every day | ORAL | Status: DC
  Administered 2018-07-18 – 2018-07-21 (×4): 81 mg via ORAL
  Filled 2018-07-18 (×4): qty 1

## 2018-07-18 MED ORDER — FUROSEMIDE 10 MG/ML IJ SOLN
40.0000 mg | Freq: Once | INTRAMUSCULAR | Status: AC
  Administered 2018-07-18: 40 mg via INTRAVENOUS
  Filled 2018-07-18: qty 4

## 2018-07-18 MED ORDER — ALUM & MAG HYDROXIDE-SIMETH 200-200-20 MG/5ML PO SUSP
30.0000 mL | ORAL | Status: DC | PRN
  Administered 2018-07-18: 30 mL via ORAL
  Filled 2018-07-18: qty 30

## 2018-07-18 MED ORDER — INSULIN ASPART 100 UNIT/ML ~~LOC~~ SOLN
0.0000 [IU] | Freq: Every day | SUBCUTANEOUS | Status: DC
  Administered 2018-07-18: 5 [IU] via SUBCUTANEOUS
  Administered 2018-07-19: 2 [IU] via SUBCUTANEOUS

## 2018-07-18 MED ORDER — INSULIN ASPART 100 UNIT/ML ~~LOC~~ SOLN
4.0000 [IU] | Freq: Three times a day (TID) | SUBCUTANEOUS | Status: DC
  Administered 2018-07-18 – 2018-07-21 (×10): 4 [IU] via SUBCUTANEOUS

## 2018-07-18 MED ORDER — ATORVASTATIN CALCIUM 40 MG PO TABS
40.0000 mg | ORAL_TABLET | Freq: Every day | ORAL | Status: DC
  Administered 2018-07-18 – 2018-07-21 (×4): 40 mg via ORAL
  Filled 2018-07-18 (×4): qty 1

## 2018-07-18 MED ORDER — HYDRALAZINE HCL 20 MG/ML IJ SOLN: 10.0000 mg | Freq: Four times a day (QID) | INTRAMUSCULAR | Status: DC | PRN

## 2018-07-18 MED ORDER — HYDROCODONE-ACETAMINOPHEN 5-325 MG PO TABS
1.0000 | ORAL_TABLET | Freq: Four times a day (QID) | ORAL | Status: DC | PRN
  Administered 2018-07-18 – 2018-07-20 (×4): 1 via ORAL
  Filled 2018-07-18 (×4): qty 1

## 2018-07-18 MED ORDER — ACETAMINOPHEN 325 MG PO TABS
650.0000 mg | ORAL_TABLET | Freq: Four times a day (QID) | ORAL | Status: DC | PRN
  Administered 2018-07-18 – 2018-07-21 (×8): 650 mg via ORAL
  Filled 2018-07-18 (×8): qty 2

## 2018-07-18 MED ORDER — POLYETHYLENE GLYCOL 3350 17 G PO PACK
17.0000 g | PACK | Freq: Every day | ORAL | Status: DC | PRN
  Administered 2018-07-20: 17 g via ORAL
  Filled 2018-07-18: qty 1

## 2018-07-18 MED ORDER — INSULIN ASPART 100 UNIT/ML ~~LOC~~ SOLN
0.0000 [IU] | Freq: Three times a day (TID) | SUBCUTANEOUS | Status: DC
  Administered 2018-07-18: 15 [IU] via SUBCUTANEOUS
  Administered 2018-07-18: 5 [IU] via SUBCUTANEOUS
  Administered 2018-07-19: 15 [IU] via SUBCUTANEOUS
  Administered 2018-07-19: 3 [IU] via SUBCUTANEOUS
  Administered 2018-07-19: 8 [IU] via SUBCUTANEOUS
  Administered 2018-07-20: 5 [IU] via SUBCUTANEOUS
  Administered 2018-07-20: 8 [IU] via SUBCUTANEOUS
  Administered 2018-07-20 – 2018-07-21 (×2): 3 [IU] via SUBCUTANEOUS

## 2018-07-18 MED ORDER — AMLODIPINE BESYLATE 2.5 MG PO TABS
5.0000 mg | ORAL_TABLET | Freq: Every day | ORAL | Status: DC
  Administered 2018-07-18 – 2018-07-20 (×3): 5 mg via ORAL
  Filled 2018-07-18 (×3): qty 2

## 2018-07-18 MED ORDER — TAMSULOSIN HCL 0.4 MG PO CAPS
0.4000 mg | ORAL_CAPSULE | Freq: Every day | ORAL | Status: DC
  Administered 2018-07-18 – 2018-07-21 (×4): 0.4 mg via ORAL
  Filled 2018-07-18 (×4): qty 1

## 2018-07-18 MED ORDER — FUROSEMIDE 10 MG/ML IJ SOLN: 40.0000 mg | Freq: Two times a day (BID) | INTRAMUSCULAR | Status: DC

## 2018-07-18 MED ORDER — FUROSEMIDE 10 MG/ML IJ SOLN
40.0000 mg | Freq: Every day | INTRAMUSCULAR | Status: DC
  Administered 2018-07-18: 40 mg via INTRAVENOUS
  Filled 2018-07-18 (×2): qty 4

## 2018-07-18 MED ORDER — LISINOPRIL 5 MG PO TABS
5.0000 mg | ORAL_TABLET | Freq: Every day | ORAL | Status: DC
  Administered 2018-07-18 – 2018-07-21 (×4): 5 mg via ORAL
  Filled 2018-07-18 (×4): qty 1

## 2018-07-18 MED ORDER — HYDROXYZINE HCL 25 MG PO TABS
25.0000 mg | ORAL_TABLET | Freq: Three times a day (TID) | ORAL | Status: DC | PRN
  Administered 2018-07-18 (×2): 25 mg via ORAL
  Filled 2018-07-18 (×3): qty 1

## 2018-07-18 MED ORDER — ENOXAPARIN SODIUM 40 MG/0.4ML ~~LOC~~ SOLN
40.0000 mg | SUBCUTANEOUS | Status: DC
  Administered 2018-07-18 – 2018-07-21 (×4): 40 mg via SUBCUTANEOUS
  Filled 2018-07-18 (×5): qty 0.4

## 2018-07-18 NOTE — H&P (Addendum)
History and Physical    DOA: 07/18/2018  PCP: Lavinia SharpsPlacey, [name], NP  Patient coming from: Motel room  Chief Complaint: Came back for treatment after signing out AMA from [facility] yesterday  HPI: [name] is a [age] y.o. male with history h/o polysubstance abuse including cocaine, diabetes mellitus, hepatitis C, systolic CHF, schizophrenia, homelessness, noncompliance with medications who was [facility] last week for anasarca/CHF exacerbation and treated with IV diuretics, signed out AMA yesterday as he was concerned about losing housing option.  Patient states he secured a long-term motel room and came back today for continued treatment.  He also reports persistent severe scrotal swelling and now with scrotal pain which he rates at 8/10 especially while voiding.  He states he has had frequent urination/dysuria.  He also reports lower abdominal discomfort but no associated nausea or vomiting.  He admits to recent cocaine use but denies using yesterday.  He states he was on insulin for diabetes in the past but does not know the specifics.  His blood glucose was elevated in the 300s at [facility].  He denies any chest pain currently.  He had [facility] which was felt secondary to cocaine use. He is requested to be admitted for further evaluation and management.   Review of Systems: As per HPI otherwise 10 point review of systems negative.    Past Medical History:  Diagnosis Date  . CHF (congestive heart failure) (HCC)   . Chronic foot pain   . Cocaine abuse (HCC)   . Diabetes mellitus without complication (HCC)   . Hepatitis C   . Homelessness   . Hypertension   . Neuropathy   . Polysubstance abuse (HCC)   . Schizophrenia (HCC)     History reviewed. No pertinent surgical history.  Social history:  reports that he has been smoking cigarettes. He has a 20.00 pack-year smoking history. He uses smokeless tobacco. He reports current  alcohol use. He reports current drug use. Frequency: 7.00 times per week. Drugs: "Crack" cocaine, Cocaine, and Marijuana.   Allergies  Allergen Reactions  . Haldol [Haloperidol] Other (See Comments)    Muscle spasms, loss of voluntary movement. However, pt has taken Thorazine on multiple occasions with no adverse effects.     Family History  Problem Relation Age of Onset  . Hypertension Other   . Diabetes Other       Prior to Admission medications   Medication Sig Start Date End Date Taking? Authorizing Provider  acetaminophen (TYLENOL) 325 MG tablet Take 2 tablets (650 mg total) by mouth every 6 (six) hours as needed. Patient not taking: Reported on 07/15/2018 04/01/18   Dietrich PatesKhatri, Hina, PA-C  aspirin EC 81 MG tablet Take 1 tablet (81 mg total) by mouth daily. Patient not taking: Reported on 07/15/2018 03/30/18 03/30/19  Dorrell, Cathleen Cortieborah N, MD  atorvastatin (LIPITOR) 40 MG tablet Take 1 tablet (40 mg total) by mouth daily. Patient not taking: Reported on 07/15/2018 03/30/18   Dorrell, Cathleen Cortieborah N, MD  furosemide (LASIX) 20 MG tablet Take 1 tablet (20 mg total) by mouth daily. Patient not taking: Reported on 07/15/2018 03/30/18 03/30/19  Dorrell, Cathleen Cortieborah N, MD  lisinopril (PRINIVIL,ZESTRIL) 5 MG tablet Take 1 tablet (5 mg total) by mouth daily. Patient not taking: Reported on 07/15/2018 03/31/18   Dionne Anoorrell, Deborah N, MD    Physical Exam: Vitals:   07/18/18 0526 07/18/18 0822 07/18/18 0828  BP: (!) 179/99 (!) 179/95   Pulse: Marland Kitchen(!)  111 (!) 110   Resp: 19 19   Temp: 98.2 F (36.8 C) 98.5 F (36.9 C)   TempSrc: Oral Oral   SpO2: 95% 93% 95%  Weight:  87.6 kg   Height:  5\' 9"  (1.753 m)     Constitutional: Disheveled male in no acute respiratory distress Eyes: PERRL, lids and conjunctivae normal ENMT: Mucous membranes are moist. Posterior pharynx clear of any exudate or lesions.Normal dentition.  Neck: normal, supple, no masses, no thyromegaly Respiratory: clear to auscultation bilaterally,  no wheezing, no crackles. Normal respiratory effort. No accessory muscle use.  Cardiovascular: Regular rate and rhythm, no murmurs / rubs / gallops.  2+ pitting extremity edema. 2+ pedal pulses. No carotid bruits.  Abdomen: Mild suprapubic tenderness, no masses palpated. No hepatosplenomegaly. Bowel sounds positive.  ++ scrotal edema Musculoskeletal: no clubbing / cyanosis. No joint deformity upper and lower extremities. Good ROM, no contractures. Normal muscle tone.  Neurologic: CN 2-12 grossly intact. Sensation intact, DTR normal. Strength 5/5 in all 4.  Psychiatric:. Alert and oriented x 3.  Anxious and disorganized.    Labs on Admission: I have personally reviewed following labs and imaging studies  CBC: Recent Labs  Lab 07/15/18 0410 07/15/18 1847 07/17/18 0408 07/18/18 0653  WBC 8.9 7.9 11.7* 13.0*  NEUTROABS 7.1  --  9.5* 11.1*  HGB 10.6* 10.4* 11.1* 10.6*  HCT 34.2* 34.0* 36.4* 33.7*  MCV 87.5 87.9 86.9 86.2  PLT 325 335 358 347   Basic Metabolic Panel: Recent Labs  Lab 07/15/18 0410 07/15/18 1847 07/16/18 0422 07/17/18 0408 07/18/18 0653  NA 140  --  143 139 138  K 4.0  --  3.9 3.7 4.0  CL 108  --  106 101 103  CO2 23  --  29 28 25   GLUCOSE 236*  --  262* 325* 315*  BUN 30*  --  22* 25* 25*  CREATININE 0.89 0.89 0.91 0.84 0.86  CALCIUM 8.5*  --  8.4* 8.4* 8.6*   GFR: Estimated Creatinine Clearance: 103.9 mL/min (by C-G formula based on SCr of 0.86 mg/dL). Liver Function Tests: Recent Labs  Lab 07/15/18 0410  AST 104*  ALT 86*  ALKPHOS 146*  BILITOT 1.0  PROT 5.8*  ALBUMIN 2.7*   No results for input(s): LIPASE, AMYLASE in the last 168 hours. No results for input(s): AMMONIA in the last 168 hours. Coagulation Profile: No results for input(s): INR, PROTIME in the last 168 hours. Cardiac Enzymes: Recent Labs  Lab 07/15/18 0410  TROPONINI 0.04*   BNP (last 3 results) No results for input(s): PROBNP in the last 8760 hours. HbA1C: Recent Labs     07/15/18 1847 07/18/18 0702  HGBA1C 7.0* 7.3*   CBG: Recent Labs  Lab 07/17/18 1204 07/18/18 0857  GLUCAP 309* 385*   Lipid Profile: Recent Labs    07/15/18 1847  CHOL 134  HDL 24*  LDLCALC 96  TRIG 71  CHOLHDL 5.6   Thyroid Function Tests: Recent Labs    07/15/18 1847  TSH 0.555   Anemia Panel: No results for input(s): VITAMINB12, FOLATE, FERRITIN, TIBC, IRON, RETICCTPCT in the last 72 hours. Urine analysis:    Component Value Date/Time   COLORURINE YELLOW 01/07/2018 2356   APPEARANCEUR CLEAR 01/07/2018 2356   LABSPEC 1.024 01/07/2018 2356   PHURINE 5.0 01/07/2018 2356   GLUCOSEU NEGATIVE 01/07/2018 2356   HGBUR NEGATIVE 01/07/2018 2356   BILIRUBINUR NEGATIVE 01/07/2018 2356   KETONESUR NEGATIVE 01/07/2018 2356   PROTEINUR 30 (A) 01/07/2018  2356   UROBILINOGEN 1.0 03/14/2015 0610   NITRITE NEGATIVE 01/07/2018 2356   LEUKOCYTESUR NEGATIVE 01/07/2018 2356    Radiological Exams on Admission: No results found.  EKG from January 15: Independently reviewed.  Normal sinus rhythm with QTC of 480 ms     Assessment and Plan:   1.  Acute on chronic systolic CHF exacerbation: Last echo from September 2019 showed EF of 30 to 35%.  Patient has had ongoing cocaine use since then.  Denies any chest pain currently.  Recent EKG and troponins as above.  Will not repeat as would not change management.  Resume ACE inhibitors, hold beta-blockers until urine drug screen clear of cocaine.  Likely not a good candidate for beta-blockers at the time of discharge.  IV Lasix 40 mg daily with close monitoring of daily weights and input/outputs.   2.  Uncontrolled diabetes mellitus type 2: Unclear baseline insulin regimen.  Will monitor on sliding scale and pre-meal insulin.  According to prior discharge summary, patient was noncompliant with diabetic diet in the hospital.  Check hemoglobin A1c.  Blood glucose this morning 315  3.  Lower abdominal pain/scrotal discomfort: Likely  secondary to scrotal swelling.  Ordered scrotal support and ultrasonogram to rule out infection/epididymitis/torsion.  Also check PVR for suprapubic discomfort and abdominal x-ray to rule out stool burden.  Not sure if patient seeking pain medication.  Reluctant to order pain meds liberally given history of drug abuse.  4.  Schizophrenia: Patient does appear to have disorganized thoughts and bizarre behavior.  But awake alert oriented x3.  Denies any hallucinations.?  Not on any medications at baseline.  Hydroxyzine as needed for anxiety for now.  Can consider Risperdal if concern for hallucinations or psychosis during the hospital stay.  5.  Hypertension: SBP uncontrolled in systolic 170s.  Resume lisinopril.  Add Norvasc.  Hydralazine as needed   6. Homelessness: Patient states he now has a motel room to stay.  He may need medication assistance at the time of discharge.  DVT prophylaxis: Lovenox  Code Status: Full code  Admission status:  Patient admitted as inpatient as anticipated LOS greater than 2 midnights    Alessandra Bevels MD [facility] Pager [phone]  If 7PM-7AM, please contact night-coverage [email or web address] Password [facility]  07/18/2018, 9:58 AM

## 2018-07-18 NOTE — Progress Notes (Signed)
   07/18/18 1400  Clinical Encounter Type  Visited With Patient  Visit Type Initial  Referral From Nurse  Responded to [facility] consult for prayer. Patient up in chair and said that the evil spirits were trying to get him. He requested prayer and a Bible. Provided spiritual care through prayer and gave patient a New Testament Bible.

## 2018-07-18 NOTE — Progress Notes (Signed)
Pt walked up to the nurses station stating "when I was at [facility] I was on a regular diet" Pt states he is still hungry  Informed pt that dinner trays are in the hallway being passed out by dietary  Pt states he already finished his dinner tray and needs more  Primary RN aware, stated pt said the same thing after lunch   Paged MD regarding double portions order  Awaiting call back

## 2018-07-18 NOTE — Progress Notes (Signed)
Pt wanting to leave AMA due to lack of food  Pt educated on fluid restriction and low sodium diet as well as carb modified to manage blood sugars  Per primary RN pt has exceeded fluid intake for the day  Pt begins to raise voice at the nurses station   MD returned call, stated okay to give pt snacks tonight and will revaluate in the morning  Pt understanding, does not want to leave AMA at this time Pt had two ice creams and one package of graham crackers Will continue to monitor

## 2018-07-18 NOTE — Progress Notes (Signed)
Pt oriented to room, call light and urnial within reach  Pt states "I was in jail 2 weeks ago", CN & RN educated and swabbed pt for MRSA  Vital signs, weight and telemetry completed  Pt had a lighter with his personal belongings, educated pt and placed in a secure bag in his chart, pt understanding  RN assisted pt in ordering breakfast  Will continue to monitor

## 2018-07-18 NOTE — ED Notes (Signed)
Patient requested to void in a bedpan; after which he voided in the bedpan and on the floor. This nurse has already witnessed patient ambulate with a steady gait and advised patient that he is not to urinate on the floor, but to go the the bathroom to void from here out.

## 2018-07-18 NOTE — Progress Notes (Signed)
Patient bladder scan post void residual >281ml.

## 2018-07-18 NOTE — Progress Notes (Signed)
Patient has order for scrotal support, which was obtained from material supply but even the extra large was too small for patient's swollen scrotum.  Elnita Maxwell, RN

## 2018-07-18 NOTE — ED Provider Notes (Signed)
[facility] EMERGENCY DEPARTMENT Provider Note  CSN: [id number] Arrival date & time: 07/18/18 5462  Chief Complaint(s) "prostate pain/swelling" and Mouth Injury  HPI [name] is a 58 y.o. male with extensive past medical history listed below including CHF with a last EF of 30 to 35% who left AGAINST MEDICAL ADVICE from [facility] long yesterday after being admitted for CHF exacerbation and anasarca requiring IV diuresing.  Patient states that he left because he is homeless and was finally able to obtain housing.  He returns for continued symptoms of anasarca and extremity edema.  Endorses mild shortness of breath without chest pain.  No nausea or vomiting.  No abdominal pain.  Denies any other physical complaints.  HPI  Past Medical History Past Medical History:  Diagnosis Date  . CHF (congestive heart failure) (HCC)   . Chronic foot pain   . Cocaine abuse (HCC)   . Diabetes mellitus without complication (HCC)   . Hepatitis C   . Homelessness   . Hypertension   . Neuropathy   . Polysubstance abuse (HCC)   . Schizophrenia [facility])    Patient Active Problem List   Diagnosis Date Noted  . CHF exacerbation (HCC) 07/18/2018  . CHF (congestive heart failure) (HCC) 07/15/2018  . Acute exacerbation of CHF (congestive heart failure) (HCC) 03/28/2018  . Acute on chronic heart failure (HCC) 03/16/2018  . Prostate enlargement 03/16/2018  . Aortic atherosclerosis (HCC) 03/16/2018  . 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 Cocaine abuse with cocaine-induced mood disorder (HCC) 09/18/2017  . Chronic foot pain   . Schizoaffective disorder, bipolar type (HCC) 09/30/2016  . Suicidal thoughts   . Substance induced mood disorder (HCC) 03/13/2015  . Acute kidney failure (HCC) 01/26/2015  . Schizophrenia, paranoid type (HCC) 01/17/2015  . Suicidal ideation   . Drug hallucinosis (HCC) 10/08/2014  . Chronic paranoid schizophrenia (HCC) 09/07/2014  . Substance or  medication-induced bipolar and related disorder with onset during intoxication (HCC) 08/10/2014  . Acute CHF (congestive heart failure) (HCC) 07/29/2014  . Urinary retention   . Cocaine use disorder, severe, dependence (HCC)   . Cocaine abuse (HCC) 03/10/2014  . Essential hypertension, benign 03/28/2013  . Diabetes mellitus (HCC) 03/15/2013   Home Medication(s) Prior to Admission medications   Medication Sig Start Date End Date Taking? Authorizing Provider  acetaminophen (TYLENOL) 325 MG tablet Take 2 tablets (650 mg total) by mouth every 6 (six) hours as needed. Patient not taking: Reported on 07/15/2018 04/01/18   Dietrich Pates, PA-C  aspirin EC 81 MG tablet Take 1 tablet (81 mg total) by mouth daily. Patient not taking: Reported on 07/15/2018 03/30/18 03/30/19  Dorrell, Cathleen Corti, MD  atorvastatin (LIPITOR) 40 MG tablet Take 1 tablet (40 mg total) by mouth daily. Patient not taking: Reported on 07/15/2018 03/30/18   Dorrell, Cathleen Corti, MD  furosemide (LASIX) 20 MG tablet Take 1 tablet (20 mg total) by mouth daily. Patient not taking: Reported on 07/15/2018 03/30/18 03/30/19  Dorrell, Cathleen Corti, MD  lisinopril (PRINIVIL,ZESTRIL) 5 MG tablet Take 1 tablet (5 mg total) by mouth daily. Patient not taking: Reported on 07/15/2018 03/31/18   Dorrell, Cathleen Corti, MD  Past Surgical History History reviewed. No pertinent surgical history. Family History Family History  Problem Relation Age of Onset  . Hypertension Other   . Diabetes Other     Social History Social History   Tobacco Use  . Smoking status: Current Every Day Smoker    Packs/day: 1.00    Years: 20.00    Pack years: 20.00    Types: Cigarettes  . Smokeless tobacco: Current User  Substance Use Topics  . Alcohol use: Yes    Comment: Daily Drinker   . Drug use: Yes    Frequency: 7.0 times per week     Types: "Crack" cocaine, Cocaine, Marijuana    Comment: Cocaine tonight, Marijuana "a long time"   Allergies Haldol [haloperidol]  Review of Systems Review of Systems All other systems are reviewed and are negative for acute change except as noted in the HPI  Physical Exam Vital Signs  I have reviewed the triage vital signs BP (!) 179/99 (BP Location: Right Arm)   Pulse (!) 111   Temp 98.2 F (36.8 C) (Oral)   Resp 19   SpO2 95%   Physical Exam Vitals signs reviewed.  Constitutional:      General: He is not in acute distress.    Appearance: He is well-developed. He is not diaphoretic.  HENT:     Head: Normocephalic and atraumatic.     Comments: Poor dentition    Nose: Nose normal.  Eyes:     General: No scleral icterus.       Right eye: No discharge.        Left eye: No discharge.     Conjunctiva/sclera: Conjunctivae normal.     Pupils: Pupils are equal, round, and reactive to light.  Neck:     Musculoskeletal: Normal range of motion and neck supple.  Cardiovascular:     Rate and Rhythm: Normal rate and regular rhythm.     Heart sounds: No murmur. No friction rub. No gallop.   Pulmonary:     Effort: Pulmonary effort is normal. No respiratory distress.     Breath sounds: Normal breath sounds. No stridor. No rales.  Abdominal:     General: There is no distension.     Palpations: Abdomen is soft.     Tenderness: There is no abdominal tenderness.  Genitourinary:    Comments: Severe scrotal edema Musculoskeletal:        General: No tenderness.     Comments: 2+ bilateral lower extremity edema up to the thighs  Skin:    General: Skin is warm and dry.     Findings: No erythema or rash.  Neurological:     Mental Status: He is alert and oriented to person, place, and time.     ED Results and Treatments Labs (all labs ordered are listed, but only abnormal results are displayed) Labs Reviewed  CBC WITH DIFFERENTIAL/PLATELET - Abnormal; Notable for the following  components:      Result Value   WBC 13.0 (*)    RBC 3.91 (*)    Hemoglobin 10.6 (*)    HCT 33.7 (*)    RDW 16.6 (*)    Neutro Abs 11.1 (*)    All other components within normal limits  BASIC METABOLIC PANEL - Abnormal; Notable for the following components:   Glucose, Bld 315 (*)    BUN 25 (*)    Calcium 8.6 (*)    All other components within normal limits  CBC  CREATININE, SERUM  BRAIN  NATRIURETIC PEPTIDE  HEMOGLOBIN A1C                                                                                                                         EKG  EKG Interpretation  Date/Time:    Ventricular Rate:    PR Interval:    QRS Duration:   QT Interval:    QTC Calculation:   R Axis:     Text Interpretation:        Radiology No results found. Pertinent labs & imaging results that were available during my care of the patient were reviewed by me and considered in my medical decision making (see chart for details).  Medications Ordered in ED Medications  aspirin EC tablet 81 mg (has no administration in time range)  atorvastatin (LIPITOR) tablet 40 mg (has no administration in time range)  lisinopril (PRINIVIL,ZESTRIL) tablet 5 mg (has no administration in time range)  acetaminophen (TYLENOL) tablet 650 mg (has no administration in time range)  enoxaparin (LOVENOX) injection 40 mg (has no administration in time range)  polyethylene glycol (MIRALAX / GLYCOLAX) packet 17 g (has no administration in time range)  furosemide (LASIX) injection 40 mg (has no administration in time range)  insulin aspart (novoLOG) injection 0-15 Units (has no administration in time range)  insulin aspart (novoLOG) injection 0-5 Units (has no administration in time range)  insulin aspart (novoLOG) injection 4 Units (has no administration in time range)  tamsulosin (FLOMAX) capsule 0.4 mg (has no administration in time range)  furosemide (LASIX) injection 40 mg (40 mg Intravenous Given 07/18/18 0732)                                                                                                                                     Procedures Procedures  (including critical care time)  Medical Decision Making / ED Course I have reviewed the nursing notes for this encounter and the patient's prior records (if available in EHR or on provided paperwork).    Patient returns for continued treatment of his anasarca and CHF exacerbation.  Screening labs obtain.  40 mg of IV Lasix given.  Discussed case with hospitalist who will admit.  Final Clinical Impression(s) / ED Diagnoses Final diagnoses:  Anasarca      This chart was dictated using voice recognition software.  Despite best efforts to proofread,  errors can occur which can change the  documentation meaning.   Nira Conn, MD 07/18/18 [phone]

## 2018-07-19 LAB — BASIC METABOLIC PANEL
Anion gap: 9 (ref 5–15)
BUN: 17 mg/dL (ref 6–20)
CO2: 29 mmol/L (ref 22–32)
Calcium: 8.6 mg/dL — ABNORMAL LOW (ref 8.9–10.3)
Chloride: 103 mmol/L (ref 98–111)
Creatinine, Ser: 0.8 mg/dL (ref 0.61–1.24)
GFR calc Af Amer: >60 mL/min (ref >60)
GFR calc non Af Amer: >60 mL/min (ref >60)
Glucose, Bld: 292 mg/dL — ABNORMAL HIGH (ref 70–99)
POTASSIUM: 3.7 mmol/L (ref 3.5–5.1)
Sodium: 141 mmol/L (ref 135–145)

## 2018-07-19 LAB — GLUCOSE, CAPILLARY
Glucose-Capillary: 283 mg/dL — ABNORMAL HIGH (ref 70–99)
Glucose-Capillary: 200 mg/dL — ABNORMAL HIGH (ref 70–99)
Glucose-Capillary: 389 mg/dL — ABNORMAL HIGH (ref 70–99)
Glucose-Capillary: 225 mg/dL — ABNORMAL HIGH (ref 70–99)

## 2018-07-19 MED ORDER — FUROSEMIDE 10 MG/ML IJ SOLN
60.0000 mg | INTRAMUSCULAR | Status: AC
  Administered 2018-07-19: 60 mg via INTRAVENOUS

## 2018-07-19 MED ORDER — INSULIN GLARGINE 100 UNIT/ML ~~LOC~~ SOLN
10.0000 [IU] | Freq: Every day | SUBCUTANEOUS | Status: DC
  Administered 2018-07-19: 10 [IU] via SUBCUTANEOUS
  Filled 2018-07-19: qty 0.1

## 2018-07-19 MED ORDER — INSULIN GLARGINE 100 UNIT/ML ~~LOC~~ SOLN
20.0000 [IU] | Freq: Every day | SUBCUTANEOUS | Status: DC
  Administered 2018-07-20 – 2018-07-21 (×2): 20 [IU] via SUBCUTANEOUS
  Filled 2018-07-19 (×2): qty 0.2

## 2018-07-19 MED ORDER — INSULIN GLARGINE 100 UNIT/ML ~~LOC~~ SOLN
10.0000 [IU] | SUBCUTANEOUS | Status: AC
  Administered 2018-07-19: 10 [IU] via SUBCUTANEOUS
  Filled 2018-07-19 (×2): qty 0.1

## 2018-07-19 MED ORDER — POTASSIUM CHLORIDE CRYS ER 20 MEQ PO TBCR
40.0000 meq | EXTENDED_RELEASE_TABLET | Freq: Once | ORAL | Status: AC
  Administered 2018-07-19: 40 meq via ORAL
  Filled 2018-07-19: qty 2

## 2018-07-19 MED ORDER — FUROSEMIDE 10 MG/ML IJ SOLN
40.0000 mg | Freq: Three times a day (TID) | INTRAMUSCULAR | Status: DC
  Administered 2018-07-19 – 2018-07-20 (×2): 40 mg via INTRAVENOUS
  Filled 2018-07-19 (×2): qty 4

## 2018-07-19 NOTE — Progress Notes (Signed)
PROGRESS NOTE                                                                                                                                                                                                             Patient Demographics:    [name], is a 58 y.o. male, DOB - [birth year], WUJ:[id number]  Admit date - 07/18/2018   Admitting Physician Taylor BevelsNeelima Kamineni, MD  Outpatient Primary MD for the patient is [name], [name], NP  LOS - 1   Chief Complaint  Patient presents with  . "prostate pain/swelling"  . Mouth Injury       Brief Narrative    58 y.o. male with history h/o polysubstance abuse including cocaine, diabetes mellitus, hepatitis C, systolic CHF, schizophrenia, homelessness, noncompliance with medications who was [facility] to [facility] last week for anasarca/CHF exacerbation and treated with IV diuretics, signed out AMA yesterday as he was concerned about losing housing option.  Patient states he secured a long-term motel room and came back today for continued treatment.  He also reports persistent severe scrotal swelling and now with scrotal pain which he rates at 8/10 especially while voiding.  He states he has had frequent urination/dysuria.  He also reports lower abdominal discomfort but no associated nausea or vomiting.  He admits to recent cocaine use but denies using yesterday.  He states he was on insulin for diabetes in the past but does not know the specifics.  His blood glucose was elevated in the 300s at [facility].  He denies any chest pain currently.  He had mildly elevated troponin at [facility] which was felt secondary to cocaine use. He is requested to be admitted for further evaluation and management.    Subjective:    Taylor RobinsonsFrederick [name] today has any complaints, no scrotal pain, no abdominal pain, no chest pain, reports he used cocaine after he signed AMA from [facility] yesterday    Assessment   & Plan :    Active Problems:   Diabetes mellitus (HCC)   Essential hypertension, benign   Cocaine abuse (HCC)   Chronic paranoid schizophrenia (HCC)   CHF exacerbation (HCC)    Acute on chronic systolic CHF exacerbation:  - Last echo from September 2019 showed EF of 30 to 35%.   -She is volume overloaded, I will give 60 mg  of IV Lasix now, then continue with Lasix 40 IV every 8 hours, continue with daily weights, strict ins and outs . -Continue with ACE inhibitor, certainly no beta-blockers in the setting of recurrent cocaine use   Uncontrolled diabetes mellitus type 2:  - Unclear baseline insulin regimen.    BG remains uncontrolled, I will start on Lantus 10 units daily, continue with sliding scale and pre-meal insulin .  Lower abdominal pain/scrotal discomfort:  -Appears to be resolved today, ultrasound suspicious for old left scrotum trauma, it is on nontender physical exam today, willing related to generalized volume overload up to his scrotum . -Continue with diuresis .  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  Resume lisinopril.  Add Norvasc.  Hydralazine as needed   Homelessness: Patient states he now has a motel room to stay.  He may need medication assistance at the time of discharge  Cocaine abuse -Positive for cocaine, reports he did snort cocaine when he signed AMA, he was counseled, no beta-blockers.    Code Status : Full  Family Communication  : None at bedside  Disposition Plan  : Home when stable(he has motel room long-term)  Barriers For Discharge : remains volume overloaded requiring IV diuresis  Consults  :  None  Procedures  : None  DVT Prophylaxis  :  [place] lovenox  Lab Results  Component Value Date   PLT 347 07/18/2018    Antibiotics  :    Anti-infectives  (From admission, onward)   None        Objective:   Vitals:   07/18/18 0828 07/18/18 1359 07/19/18 0021 07/19/18 0456  BP:  (!) 164/92 (!) 169/94 (!) 153/98  Pulse:  (!) 110 (!) 105 (!) 105  Resp:  20 20 20   Temp:  98.4 F (36.9 C) 98.4 F (36.9 C) 98.1 F (36.7 C)  TempSrc:  Oral Oral Oral  SpO2: 95% 91% 94% 93%  Weight:    85.3 kg  Height:        Wt Readings from Last 3 Encounters:  07/19/18 85.3 kg  07/17/18 88.5 kg  04/01/18 76.3 kg     Intake/Output Summary (Last 24 hours) at 07/19/2018 1153 Last data filed at 07/19/2018 0900 Gross per 24 hour  Intake 1180 ml  Output 1951 ml  Net -771 ml     Physical Exam  Awake Alert, Oriented X 3, No new F.N deficits, Normal affect Symmetrical Chest wall movement, Good air movement bilaterally, CTAB RRR,No Gallops,Rubs or new Murmurs, No Parasternal Heave +ve B.Sounds, Abd Soft, No tenderness,  No rebound - guarding or rigidity. No Cyanosis, positive edema bilaterally, with scrotal edema, no tenderness to palpation both sides    Data Review:    CBC Recent Labs  Lab 07/15/18 0410 07/15/18 1847 07/17/18 0408 07/18/18 0653  WBC 8.9 7.9 11.7* 13.0*  HGB 10.6* 10.4* 11.1* 10.6*  HCT 34.2* 34.0* 36.4* 33.7*  PLT 325 335 358 347  MCV 87.5 87.9 86.9 86.2  MCH 27.1 26.9 26.5 27.1  MCHC 31.0 30.6 30.5 31.5  RDW 16.5* 16.6* 16.7* 16.6*  LYMPHSABS 1.0  --  1.3 1.0  MONOABS 0.7  --  0.8 0.8  EOSABS 0.0  --  0.1 0.1  BASOSABS 0.0  --  0.0 0.0    Chemistries  Recent Labs  Lab 07/15/18 0410  07/15/18 1847 07/16/18 0422 07/17/18 0408 07/18/18 0653 07/19/18 0404  NA 140  --  143 139 138 141  K 4.0  --  3.9 3.7 4.0 3.7  CL 108  --  106 101 103 103  CO2 23  --  29 28 25 29   GLUCOSE 236*  --  262* 325* 315* 292*  BUN 30*  --  22* 25* 25* 17  CREATININE 0.89 0.89 0.91 0.84 0.86 0.80  CALCIUM 8.5*  --  8.4* 8.4* 8.6* 8.6*  AST 104*  --   --   --   --   --   ALT 86*  --   --   --   --   --   ALKPHOS 146*  --   --    --   --   --   BILITOT 1.0  --   --   --   --   --    ------------------------------------------------------------------------------------------------------------------ No results for input(s): CHOL, HDL, LDLCALC, TRIG, CHOLHDL, LDLDIRECT in the last 72 hours.  Lab Results  Component Value Date   HGBA1C 7.3 (H) 07/18/2018   ------------------------------------------------------------------------------------------------------------------ No results for input(s): TSH, T4TOTAL, T3FREE, THYROIDAB in the last 72 hours.  Invalid input(s): FREET3 ------------------------------------------------------------------------------------------------------------------ No results for input(s): VITAMINB12, FOLATE, FERRITIN, TIBC, IRON, RETICCTPCT in the last 72 hours.  Coagulation profile No results for input(s): INR, PROTIME in the last 168 hours.  No results for input(s): DDIMER in the last 72 hours.  Cardiac Enzymes Recent Labs  Lab 07/15/18 0410  TROPONINI 0.04*   ------------------------------------------------------------------------------------------------------------------    Component Value Date/Time   BNP 948.3 (H) 07/18/2018 0702    Inpatient Medications  Scheduled Meds: . amLODipine  5 mg Oral Daily  . aspirin EC  81 mg Oral Daily  . atorvastatin  40 mg Oral Daily  . enoxaparin (LOVENOX) injection  40 mg Subcutaneous Q24H  . furosemide  40 mg Intravenous Q8H  . furosemide  60 mg Intravenous NOW  . insulin aspart  0-15 Units Subcutaneous TID WC  . insulin aspart  0-5 Units Subcutaneous QHS  . insulin aspart  4 Units Subcutaneous TID WC  . lisinopril  5 mg Oral Daily  . potassium chloride  40 mEq Oral Once  . tamsulosin  0.4 mg Oral Daily   Continuous Infusions: PRN Meds:.acetaminophen, alum & mag hydroxide-simeth, hydrALAZINE, HYDROcodone-acetaminophen, hydrOXYzine, polyethylene glycol  Micro Results Recent Results (from the past 240 hour(s))  MRSA PCR Screening      Status: None   Collection Time: 07/18/18  8:44 AM  Result Value Ref Range Status   MRSA by PCR NEGATIVE NEGATIVE Final    Comment:        The GeneXpert MRSA Assay (FDA approved for NASAL specimens only), is one component of a comprehensive MRSA colonization surveillance program. It is not intended to diagnose MRSA infection nor to guide or monitor treatment for MRSA infections. Performed at [facility], [address] [place], [place] [address]     Radiology Reports Dg Chest 2 View  Result Date: 07/15/2018 CLINICAL DATA:  Shortness of breath, lower extremity swelling EXAM: CHEST - 2 VIEW COMPARISON:  03/28/2018 FINDINGS: Prominent interstitial markings. Mild lingular and right infrahilar opacities. The overall appearance suggests mild perihilar edema. No definite pleural effusions. No pneumothorax. Cardiomegaly. Mild degenerative changes of the visualized thoracolumbar spine. IMPRESSION: Cardiomegaly with suspected mild perihilar edema. Electronically Signed   By: Charline BillsSriyesh  Krishnan M.D.   On: 07/15/2018 03:41   Dg Abd  Portable 2v  Result Date: 07/18/2018 CLINICAL DATA:  Abdominal pain. History of hypertension and diabetes. EXAM: PORTABLE ABDOMEN - 2 VIEW COMPARISON:  02/24/2018 FINDINGS: There is no bowel dilation to suggest obstruction. No air-fluid levels or free air. No adynamic ileus. Mild to moderate generalized increased stool burden in the colon. No evidence of renal or ureteral stones. No acute skeletal abnormality. IMPRESSION: 1. No acute findings.  No evidence of bowel obstruction or free air. 2. Mild to moderate increased stool burden throughout the colon. Electronically Signed   By: Amie Portland M.D.   On: 07/18/2018 11:02   US Scrotum W/doppler  Addendum Date: 07/18/2018   ADDENDUM REPORT: 07/18/2018 20:37 ADDENDUM: Numerous attempts were made to speak to the ordering physician and the hospitalist. Neither returned my calls. I did speak to both the sonographer and  the nurse to confirm that the patient's pain does not localize to a specific testicle. Rather, it is diffuse and thought to be associated with the swelling. Electronically Signed   By: Gerome Sam III M.D   On: 07/18/2018 20:37   Result Date: 07/18/2018 CLINICAL DATA:  Diffuse scrotal pain, not localizing to either testicle by report. Scrotal swelling. EXAM: SCROTAL ULTRASOUND DOPPLER ULTRASOUND OF THE TESTICLES TECHNIQUE: Complete ultrasound examination of the testicles, epididymis, and other scrotal structures was performed. Color and spectral Doppler ultrasound were also utilized to evaluate blood flow to the testicles. COMPARISON:  None. FINDINGS: Right testicle Measurements: 4.0 x 3.0 x 3.1 cm. No mass or microlithiasis visualized. Left testicle Measurements: 2.7 x 1.9 x 2.5 cm. The left testicle is smaller than the right and mildly heterogeneous with no focal mass. Right epididymis: Contains a large epididymal cysts measuring 2.6 cm. Left epididymis:  No focal mass. Hydrocele:  Small right hydrocele.  No left hydrocele. Varicocele:  None visualized. Pulsed Doppler interrogation of both testes demonstrates low resistance arterial and venous blood flow bilaterally. There is more blood flow on the right than the left but good waveforms are seen bilaterally. IMPRESSION: 1. The left testicle is smaller than the right and mildly heterogeneous with no focal mass. Arterial and venous blood flow was documented in the left testicle although there is less blood flow on the left than the right. Given the history of diffuse scrotal pain and swelling, not localizing to either specific testicle, I suspect the left testicle experienced a remote insult resulting in today's findings. I did call the sonographer to ensure that the patient's pain did not localized to the left. Recommend clinical correlation by the clinical team to ensure that the patient's pain does not localize to 1 of the testicles. 2. There is more blood  flow in the right testicle than the left. However, the blood flow on the right was thought to be normal. It was not thought to be abnormally increased at the time of imaging. 3. Diffuse scrotal swelling, likely resulting in the patient's diffuse pain. 4. Large 2.6 cm right epididymal cysts.  Small right hydrocele. Findings are being called to the referring clinical team. Electronically Signed: By: Gerome Sam III M.D On: 07/18/2018 19:47    Mliss Fritz Ramond Darnell M.D on 07/19/2018 at 11:53 AM  Between 7am to 7pm - Pager - [phone]  After 7pm go to [email or web address] - password [facility]  [facility] -  Office  [phone]

## 2018-07-19 NOTE — Progress Notes (Signed)
This patient is knowingly going against dietary recommendations by insisting to have double trays and liberal snacks between meals. Physician gave approval for patient to have what is requested as the likelihood of compliance is slim.

## 2018-07-19 NOTE — Progress Notes (Signed)
  Spoke with Dr. Randol Kern regarding patient diet . MD states extra food is ok with him.

## 2018-07-20 ENCOUNTER — Encounter (HOSPITAL_COMMUNITY): Payer: Self-pay | Admitting: General Practice

## 2018-07-20 DIAGNOSIS — E119 Type 2 diabetes mellitus without complications: Secondary | ICD-10-CM

## 2018-07-20 LAB — GLUCOSE, CAPILLARY
Glucose-Capillary: 203 mg/dL — ABNORMAL HIGH (ref 70–99)
Glucose-Capillary: 151 mg/dL — ABNORMAL HIGH (ref 70–99)
Glucose-Capillary: 296 mg/dL — ABNORMAL HIGH (ref 70–99)
Glucose-Capillary: 186 mg/dL — ABNORMAL HIGH (ref 70–99)

## 2018-07-20 LAB — BASIC METABOLIC PANEL
ANION GAP: 10 (ref 5–15)
BUN: 24 mg/dL — ABNORMAL HIGH (ref 6–20)
CO2: 26 mmol/L (ref 22–32)
Calcium: 8.4 mg/dL — ABNORMAL LOW (ref 8.9–10.3)
Chloride: 100 mmol/L (ref 98–111)
Creatinine, Ser: 0.99 mg/dL (ref 0.61–1.24)
GFR calc Af Amer: >60 mL/min (ref >60)
GFR calc non Af Amer: >60 mL/min (ref >60)
Glucose, Bld: 294 mg/dL — ABNORMAL HIGH (ref 70–99)
Potassium: 3.9 mmol/L (ref 3.5–5.1)
Sodium: 136 mmol/L (ref 135–145)

## 2018-07-20 MED ORDER — ZOLPIDEM TARTRATE 5 MG PO TABS
5.0000 mg | ORAL_TABLET | Freq: Every evening | ORAL | Status: DC | PRN
  Administered 2018-07-20: 5 mg via ORAL
  Filled 2018-07-20: qty 1

## 2018-07-20 MED ORDER — AMLODIPINE BESYLATE 2.5 MG PO TABS
5.0000 mg | ORAL_TABLET | Freq: Once | ORAL | Status: AC
  Administered 2018-07-20: 5 mg via ORAL
  Filled 2018-07-20: qty 2

## 2018-07-20 MED ORDER — AMLODIPINE BESYLATE 10 MG PO TABS
10.0000 mg | ORAL_TABLET | Freq: Every day | ORAL | Status: DC
  Administered 2018-07-21: 10 mg via ORAL
  Filled 2018-07-20: qty 1

## 2018-07-20 MED ORDER — FUROSEMIDE 10 MG/ML IJ SOLN
80.0000 mg | Freq: Three times a day (TID) | INTRAMUSCULAR | Status: DC
  Administered 2018-07-20 – 2018-07-21 (×3): 80 mg via INTRAVENOUS
  Filled 2018-07-20 (×3): qty 8

## 2018-07-20 MED ORDER — METOLAZONE 2.5 MG PO TABS
2.5000 mg | ORAL_TABLET | Freq: Once | ORAL | Status: AC
  Administered 2018-07-20: 2.5 mg via ORAL
  Filled 2018-07-20: qty 1

## 2018-07-20 NOTE — Progress Notes (Signed)
   07/20/18 0610  Vitals  Temp 98.1 F (36.7 C)  Temp Source Oral  BP (!) 165/103  MAP (mmHg) 121  BP Location Left Arm  BP Method Automatic  Patient Position (if appropriate) Sitting  Pulse Rate (!) 102  Pulse Rate Source Monitor  Resp 18  Oxygen Therapy  SpO2 94 %  O2 Device Room Air   Patient up and walking around around. 0600 dose of lasix given. Will recheck BP in 30 minutes to determine if PRN hydralazine is warranted. Will continue to closely monitor.

## 2018-07-20 NOTE — Progress Notes (Signed)
Patient is constantly coming to the desk asking for food, staff has provided patient with multiple snacks, drinks, and extra meal tray per his request.  Patient had a dinner tray around 1630 and another one around 1730, around 1800, patient called to the nursing station requesting another tray, or if he cant have it he wants to leave AMA. When this writer went to patient room, he started yelling and screening at her saying he wants another dam* tray or else he's getting his a** out of here. When this RN tried to explain or calmed patient down he replied and said get your a** out of my room  And never come back here. [facility] also [facility].

## 2018-07-20 NOTE — Progress Notes (Signed)
PROGRESS NOTE                                                                                                                                                                                                             Patient Demographics:    [name], is a 58 y.o. male, DOB - 12-Feb-1961, FAO:[id number]  Admit date - 07/18/2018   Admitting Physician Alessandra BevelsNeelima Kamineni, MD  Outpatient Primary MD for the patient is [name], Taylor AbrahamsMary Ann, NP  LOS - 2   Chief Complaint  Patient presents with  . "prostate pain/swelling"  . Mouth Injury       Brief Narrative    58 y.o. male with history h/o polysubstance abuse including cocaine, diabetes mellitus, hepatitis C, systolic CHF, schizophrenia, homelessness, noncompliance with medications who was admitted [facility] last week for anasarca/CHF exacerbation and treated with IV diuretics, signed out AMA yesterday as he was concerned about losing housing option.  Patient states he secured a long-term motel room and came back today for continued treatment.  He also reports persistent severe scrotal swelling and now with scrotal pain which he rates at 8/10 especially while voiding.  He states he has had frequent urination/dysuria.  He also reports lower abdominal discomfort but no associated nausea or vomiting.  He admits to recent cocaine use but denies using yesterday.  He states he was on insulin for diabetes in the past but does not know the specifics.  His blood glucose was elevated in the 300s at [facility].  He denies any chest pain currently.  He had mildly elevated troponin [facility] which was felt secondary to cocaine use. He is requested to be admitted for further evaluation and management.    Subjective:    Taylor RobinsonsFrederick Morrissette today has any complaints today.   Assessment  & Plan :    Active Problems:   Diabetes mellitus (HCC)   Essential hypertension, benign   Cocaine abuse (HCC)  Chronic paranoid schizophrenia (HCC)   CHF exacerbation (HCC)    Acute on chronic systolic CHF exacerbation:  - Last echo from September 2019 showed EF of 30 to 35%.   -he is volume overloaded, he is diuresing well, insulin as an accurate secondary to noncompliance, raise his Lasix to 80 mg IV every 8 hours, will give an extra dose of metolazone  today. -Continue with ACE inhibitor, certainly no beta-blockers in the setting of recurrent cocaine use   Uncontrolled diabetes mellitus type 2:  - Unclear baseline insulin regimen.    CBG uncontrolled due to noncompliance, his Lantus has been uptitrated, CBG appears to be currently controlled on 20 units of Lantus, continue with insulin sliding scale.  Lower abdominal pain/scrotal discomfort:  -Appears to be resolved today, ultrasound suspicious for old left scrotum trauma, it is on nontender physical exam today, willing related to generalized volume overload up to his scrotum . -Continue with diuresis .  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  Resume lisinopril.  Add Norvasc.  Hydralazine as needed   Homelessness:  -Child psychotherapistocial worker consulted  Cocaine abuse -Positive for cocaine, reports he did snort cocaine when he signed AMA, he was counseled, no beta-blockers.    Code Status : Full  Family Communication  : None at bedside  Disposition Plan  : Worker consulted for possible placement for group home  Barriers For Discharge : remains volume overloaded requiring IV diuresis  Consults  :  None  Procedures  : None  DVT Prophylaxis  :  Ravine lovenox  Lab Results  Component Value Date   PLT 347 07/18/2018    Antibiotics  :    Anti-infectives (From admission, onward)   None        Objective:   Vitals:   07/20/18 0406 07/20/18 0610 07/20/18  0852 07/20/18 1039  BP:  (!) 165/103 (!) 157/87   Pulse:  (!) 102 (!) 102 99  Resp:  18    Temp:  98.1 F (36.7 C)    TempSrc:  Oral    SpO2:  94%    Weight: 85.2 kg     Height:        Wt Readings from Last 3 Encounters:  07/20/18 85.2 kg  07/17/18 88.5 kg  04/01/18 76.3 kg     Intake/Output Summary (Last 24 hours) at 07/20/2018 1216 Last data filed at 07/20/2018 0926 Gross per 24 hour  Intake 1280 ml  Output 600 ml  Net 680 ml     Physical Exam  Awake Alert, Oriented X 3, No new F.N deficits, Normal affect Symmetrical Chest wall movement, Good air movement bilaterally, CTAB RRR,No Gallops,Rubs or new Murmurs, No Parasternal Heave +ve B.Sounds, Abd Soft, No tenderness, No rebound - guarding or rigidity. No Cyanosis, Clubbing, lower extremity edema has improved, as well as scrotal edema has improved as well, scrotum nontender      Data Review:    CBC Recent Labs  Lab 07/15/18 0410 07/15/18 1847 07/17/18 0408 07/18/18 0653  WBC 8.9 7.9 11.7* 13.0*  HGB 10.6* 10.4* 11.1* 10.6*  HCT 34.2* 34.0* 36.4* 33.7*  PLT 325 335 358 347  MCV 87.5 87.9 86.9 86.2  MCH 27.1 26.9 26.5 27.1  MCHC 31.0 30.6 30.5 31.5  RDW 16.5* 16.6* 16.7* 16.6*  LYMPHSABS 1.0  --  1.3 1.0  MONOABS 0.7  --  0.8 0.8  EOSABS 0.0  --  0.1 0.1  BASOSABS 0.0  --  0.0 0.0    Chemistries  Recent Labs  Lab 07/15/18 0410  07/16/18 0422 07/17/18 0408 07/18/18 0653 07/19/18 0404 07/20/18 0502  NA 140  --  143 139 138 141 136  K 4.0  --  3.9 3.7 4.0 3.7  3.9  CL 108  --  106 101 103 103 100  CO2 23  --  29 28 25 29 26   GLUCOSE 236*  --  262* 325* 315* 292* 294*  BUN 30*  --  22* 25* 25* 17 24*  CREATININE 0.89   < > 0.91 0.84 0.86 0.80 0.99  CALCIUM 8.5*  --  8.4* 8.4* 8.6* 8.6* 8.4*  AST 104*  --   --   --   --   --   --   ALT 86*  --   --   --   --   --   --   ALKPHOS 146*  --   --   --   --   --   --   BILITOT 1.0  --   --   --   --   --   --    < > = values in this interval not  displayed.   ------------------------------------------------------------------------------------------------------------------ No results for input(s): CHOL, HDL, LDLCALC, TRIG, CHOLHDL, LDLDIRECT in the last 72 hours.  Lab Results  Component Value Date   HGBA1C 7.3 (H) 07/18/2018   ------------------------------------------------------------------------------------------------------------------ No results for input(s): TSH, T4TOTAL, T3FREE, THYROIDAB in the last 72 hours.  Invalid input(s): FREET3 ------------------------------------------------------------------------------------------------------------------ No results for input(s): VITAMINB12, FOLATE, FERRITIN, TIBC, IRON, RETICCTPCT in the last 72 hours.  Coagulation profile No results for input(s): INR, PROTIME in the last 168 hours.  No results for input(s): DDIMER in the last 72 hours.  Cardiac Enzymes Recent Labs  Lab 07/15/18 0410  TROPONINI 0.04*   ------------------------------------------------------------------------------------------------------------------    Component Value Date/Time   BNP 948.3 (H) 07/18/2018 0702    Inpatient Medications  Scheduled Meds: . amLODipine  5 mg Oral Daily  . aspirin EC  81 mg Oral Daily  . atorvastatin  40 mg Oral Daily  . enoxaparin (LOVENOX) injection  40 mg Subcutaneous Q24H  . furosemide  40 mg Intravenous Q8H  . insulin aspart  0-15 Units Subcutaneous TID WC  . insulin aspart  0-5 Units Subcutaneous QHS  . insulin aspart  4 Units Subcutaneous TID WC  . insulin glargine  20 Units Subcutaneous Daily  . lisinopril  5 mg Oral Daily  . tamsulosin  0.4 mg Oral Daily   Continuous Infusions: PRN Meds:.acetaminophen, alum & mag hydroxide-simeth, hydrALAZINE, HYDROcodone-acetaminophen, hydrOXYzine, polyethylene glycol  Micro Results Recent Results (from the past 240 hour(s))  MRSA PCR Screening     Status: None   Collection Time: 07/18/18  8:44 AM  Result Value Ref  Range Status   MRSA by PCR NEGATIVE NEGATIVE Final    Comment:        The GeneXpert MRSA Assay (FDA approved for NASAL specimens only), is one component of a comprehensive MRSA colonization surveillance program. It is not intended to diagnose MRSA infection nor to guide or monitor treatment for MRSA infections. Performed at [facility], [address] [place], [place] [address]     Radiology Reports Dg Chest 2 View  Result Date: 07/15/2018 CLINICAL DATA:  Shortness of breath, lower extremity swelling EXAM: CHEST - 2 VIEW COMPARISON:  03/28/2018 FINDINGS: Prominent interstitial markings. Mild lingular and right infrahilar opacities. The overall appearance suggests mild perihilar edema. No definite pleural effusions. No pneumothorax. Cardiomegaly. Mild degenerative changes of the visualized thoracolumbar spine. IMPRESSION: Cardiomegaly with suspected mild perihilar edema. Electronically Signed   By: Charline Bills M.D.   On: 07/15/2018 03:41   Dg Abd Portable 2v  Result Date:  07/18/2018 CLINICAL DATA:  Abdominal pain. History of hypertension and diabetes. EXAM: PORTABLE ABDOMEN - 2 VIEW COMPARISON:  02/24/2018 FINDINGS: There is no bowel dilation to suggest obstruction. No air-fluid levels or free air. No adynamic ileus. Mild to moderate generalized increased stool burden in the colon. No evidence of renal or ureteral stones. No acute skeletal abnormality. IMPRESSION: 1. No acute findings.  No evidence of bowel obstruction or free air. 2. Mild to moderate increased stool burden throughout the colon. Electronically Signed   By: Amie Portland M.D.   On: 07/18/2018 11:02   US Scrotum W/doppler  Addendum Date: 07/18/2018   ADDENDUM REPORT: 07/18/2018 20:37 ADDENDUM: Numerous attempts were made to speak to the ordering physician and the hospitalist. Neither returned my calls. I did speak to both the sonographer and the nurse to confirm that the patient's pain does not localize to a  specific testicle. Rather, it is diffuse and thought to be associated with the swelling. Electronically Signed   By: Gerome Sam III M.D   On: 07/18/2018 20:37   Result Date: 07/18/2018 CLINICAL DATA:  Diffuse scrotal pain, not localizing to either testicle by report. Scrotal swelling. EXAM: SCROTAL ULTRASOUND DOPPLER ULTRASOUND OF THE TESTICLES TECHNIQUE: Complete ultrasound examination of the testicles, epididymis, and other scrotal structures was performed. Color and spectral Doppler ultrasound were also utilized to evaluate blood flow to the testicles. COMPARISON:  None. FINDINGS: Right testicle Measurements: 4.0 x 3.0 x 3.1 cm. No mass or microlithiasis visualized. Left testicle Measurements: 2.7 x 1.9 x 2.5 cm. The left testicle is smaller than the right and mildly heterogeneous with no focal mass. Right epididymis: Contains a large epididymal cysts measuring 2.6 cm. Left epididymis:  No focal mass. Hydrocele:  Small right hydrocele.  No left hydrocele. Varicocele:  None visualized. Pulsed Doppler interrogation of both testes demonstrates low resistance arterial and venous blood flow bilaterally. There is more blood flow on the right than the left but good waveforms are seen bilaterally. IMPRESSION: 1. The left testicle is smaller than the right and mildly heterogeneous with no focal mass. Arterial and venous blood flow was documented in the left testicle although there is less blood flow on the left than the right. Given the history of diffuse scrotal pain and swelling, not localizing to either specific testicle, I suspect the left testicle experienced a remote insult resulting in today's findings. I did call the sonographer to ensure that the patient's pain did not localized to the left. Recommend clinical correlation by the clinical team to ensure that the patient's pain does not localize to 1 of the testicles. 2. There is more blood flow in the right testicle than the left. However, the blood flow on  the right was thought to be normal. It was not thought to be abnormally increased at the time of imaging. 3. Diffuse scrotal swelling, likely resulting in the patient's diffuse pain. 4. Large 2.6 cm right epididymal cysts.  Small right hydrocele. Findings are being called to the referring clinical team. Electronically Signed: By: Gerome Sam III M.D On: 07/18/2018 19:47    Mliss Fritz Pahola Dimmitt M.D on 07/20/2018 at 12:16 PM  Between 7am to 7pm - Pager - [phone]  After 7pm go to [email or web address] - password [facility]  [facility] -  Office  [phone]

## 2018-07-21 DIAGNOSIS — F200 Paranoid schizophrenia: Secondary | ICD-10-CM

## 2018-07-21 LAB — GLUCOSE, CAPILLARY: Glucose-Capillary: 155 mg/dL — ABNORMAL HIGH (ref 70–99)

## 2018-07-21 LAB — BASIC METABOLIC PANEL
Anion gap: 9 (ref 5–15)
BUN: 25 mg/dL — ABNORMAL HIGH (ref 6–20)
CO2: 31 mmol/L (ref 22–32)
Calcium: 9.3 mg/dL (ref 8.9–10.3)
Chloride: 97 mmol/L — ABNORMAL LOW (ref 98–111)
Creatinine, Ser: 0.97 mg/dL (ref 0.61–1.24)
GFR calc Af Amer: >60 mL/min (ref >60)
Glucose, Bld: 135 mg/dL — ABNORMAL HIGH (ref 70–99)
Potassium: 3.5 mmol/L (ref 3.5–5.1)
Sodium: 137 mmol/L (ref 135–145)

## 2018-07-21 MED ORDER — INSULIN GLARGINE 100 UNIT/ML ~~LOC~~ SOLN: 20.0000 [IU] | Freq: Every day | SUBCUTANEOUS | 0 refills | Status: DC

## 2018-07-21 MED ORDER — AMLODIPINE BESYLATE 10 MG PO TABS: 10.0000 mg | ORAL_TABLET | Freq: Every day | ORAL | 0 refills | Status: DC

## 2018-07-21 MED ORDER — FUROSEMIDE 20 MG PO TABS: ORAL_TABLET | ORAL | 0 refills | Status: DC

## 2018-07-21 MED ORDER — INSULIN GLARGINE 100 UNIT/ML SOLOSTAR PEN: 20.0000 [IU] | PEN_INJECTOR | Freq: Every day | SUBCUTANEOUS | 0 refills | Status: DC

## 2018-07-21 MED ORDER — INSULIN PEN NEEDLE 29G X 8MM MISC: 0 refills | Status: DC

## 2018-07-21 MED ORDER — INSULIN GLARGINE 100 UNIT/ML ~~LOC~~ SOLN: 20.0000 [IU] | Freq: Every day | SUBCUTANEOUS | 1 refills | Status: DC

## 2018-07-21 MED FILL — LANTUS SOLOSTAR 100 UNITS/M: 100 | 28 days supply | Qty: 15 | Fill #0

## 2018-07-21 MED FILL — AMLODIPINE BESYLATE 10 MG T: 10 | 30 days supply | Qty: 30 | Fill #0

## 2018-07-21 MED FILL — PENTIPS 31G X 8 MM MISC: 31G X 8 MM | 30 days supply | Qty: 100 | Fill #0

## 2018-07-21 MED FILL — FUROSEMIDE 20 MG TAB: 20 | 30 days supply | Qty: 90 | Fill #0

## 2018-07-21 NOTE — Care Management Important Message (Signed)
Important Message  Patient Details  Name: [name] MRN: [id number] Date of Birth: 1961/03/16   Medicare Important Message Given:  Yes    [name] 07/21/2018, 1:17 PM

## 2018-07-21 NOTE — Progress Notes (Signed)
MD called and stated he is currently completing discharge arrangements and orders. Then, patient may discharge.   SW on unit currently speaking with patient.  Patient continues to demand snacks continuously.  These requests are contraindicated to patient plan of care and diuresing.

## 2018-07-21 NOTE — Discharge Instructions (Signed)
Follow with Primary MD [name], [name], NP in 7 days   Get CBC, CMP,checked  by Primary MD next visit.    Activity: As tolerated with Full fall precautions use walker/cane & assistance as needed   Disposition Home/motel   Diet: Heart Healthy , low salt, card modofied .  For Heart failure patients - Check your Weight same time everyday, if you gain over 2 pounds, or you develop in leg swelling, experience more shortness of breath or chest pain, call your Primary MD immediately. Follow Cardiac Low Salt Diet and 1.5 lit/day fluid restriction.   On your next visit with your primary care physician please Get Medicines reviewed and adjusted.   Please request your Prim.MD to go over all Hospital Tests and Procedure/Radiological results at the follow up, please get all Hospital records sent to your Prim MD by signing hospital release before you go home.   If you experience worsening of your admission symptoms, develop shortness of breath, life threatening emergency, suicidal or homicidal thoughts you must seek medical attention immediately by calling 911 or calling your MD immediately  if symptoms less severe.  You Must read complete instructions/literature along with all the possible adverse reactions/side effects for all the Medicines you take and that have been prescribed to you. Take any new Medicines after you have completely understood and accpet all the possible adverse reactions/side effects.   Do not drive, operating heavy machinery, perform activities at heights, swimming or participation in water activities or provide baby sitting services if your were admitted for syncope or siezures until you have seen by Primary MD or a Neurologist and advised to do so again.  Do not drive when taking Pain medications.    Do not take more than prescribed Pain, Sleep and Anxiety Medications  Special Instructions: If you have smoked or chewed Tobacco  in the last 2 yrs please stop smoking, stop  any regular Alcohol  and or any Recreational drug use.  Wear Seat belts while driving.   Please note  You were cared for by a hospitalist during your hospital stay. If you have any questions about your discharge medications or the care you received while you were in the hospital after you are discharged, you can call the unit and asked to speak with the hospitalist on call if the hospitalist that took care of you is not available. Once you are discharged, your primary care physician will handle any further medical issues. Please note that NO REFILLS for any discharge medications will be authorized once you are discharged, as it is imperative that you return to your primary care physician (or establish a relationship with a primary care physician if you do not have one) for your aftercare needs so that they can reassess your need for medications and monitor your lab values.

## 2018-07-21 NOTE — Clinical Social Work Note (Signed)
Clinical Social Work Assessment  Patient Details  Name: [name] MRN: [id number] Date of Birth: Sep 03, 1960  Date of referral:  07/21/18               Reason for consult:  Housing Concerns/Homelessness                Permission sought to share information with:    Permission granted to share information::  No  Name::        Agency::     Relationship::     Contact Information:     Housing/Transportation Living arrangements for the past 2 months:  [place] of Information:  Patient, Medical Team Patient Interpreter Needed:  None Criminal Activity/Legal Involvement Pertinent to Current Situation/Hospitalization:  No - Comment as needed Significant Relationships:  None Lives with:  Self Do you feel safe going back to the place where you live?  Yes Need for family participation in patient care:  Yes (Comment)  Care giving concerns:  Homlessness.   Social Worker assessment / plan:  CSW met with patient. CSW introduced role and inquired about resource needs. Patient stated he did not want to go to a shelter because he gets $1000 per month. He is agreeable to emergency assistance and food resources as well as a bus pass. Patient requesting an FL2 so he can work on group home placement from the community. Patient will have to go to medical records to obtain after signing a consent form. No further concerns. CSW signing off and patient has orders to discharge today.  Employment status:  Disabled (Comment on whether or not currently receiving Disability) Insurance information:  Medicare PT Recommendations:  Not assessed at this time Information / Referral to community resources:  Shelter, Other (Comment Required)(Emergency assistance resources, [employer], bus pass, [employer].)  Patient/Family's Response to care:  Patient agreeable to resources. Patient's support system unknown. No one listed in emergency contacts. Patient appreciated social work  intervention.  Patient/Family's Understanding of and Emotional Response to Diagnosis, Current Treatment, and Prognosis:  Patient has a good understanding of the reason for admission and social work consult. Patient has been verbally aggressive towards staff and security has been called this morning.  Emotional Assessment Appearance:  Appears stated age Attitude/Demeanor/Rapport:  Engaged Affect (typically observed):  Accepting Orientation:  Oriented to Self, Oriented to Place, Oriented to  Time, Oriented to Situation Alcohol / Substance use:  Illicit Drugs Psych involvement (Current and /or in the community):  Outpatient Provider([name])  Discharge Needs  Concerns to be addressed:  Care Coordination Readmission within the last 30 days:  Yes Current discharge risk:  Homeless Barriers to Discharge:  No Barriers Identified   Candie Chroman, LCSW 07/21/2018, 12:08 PM

## 2018-07-21 NOTE — Discharge Summary (Signed)
[name], is a 58 y.o. male  DOB 1960-07-16  MRN [id number].  Admission date:  07/18/2018  Admitting Physician  Alessandra Bevels, MD  Discharge Date:  07/21/2018   Primary MD  [name], [name], NP  Recommendations for primary care physician for things to follow:  -Please continue counseling about compliance, salt restriction compliance, carbohydrate modified diet, medication compliance, to new counseling about substance/cocaine use.   Patient is adamant about leaving now, he does not want to see social worker to help arrange for shelter, he is awake, alert, x3, appropriate.   Admission Diagnosis  Anasarca [R60.1]   Discharge Diagnosis  Anasarca [R60.1]    Active Problems:   Diabetes mellitus (HCC)   Essential hypertension, benign   Cocaine abuse (HCC)   Chronic paranoid schizophrenia (HCC)   CHF exacerbation (HCC)      Past Medical History:  Diagnosis Date  . CHF (congestive heart failure) (HCC)   . Chronic foot pain   . Cocaine abuse (HCC)   . Diabetes mellitus without complication (HCC)   . Hepatitis C   . Homelessness   . Hypertension   . Neuropathy   . Polysubstance abuse (HCC)   . Schizophrenia [facility])     Past Surgical History:  Procedure Laterality Date  . MULTIPLE TOOTH EXTRACTIONS         History of present illness and  Hospital Course:     Kindly see H&P for history of present illness and admission details, please review complete Labs, Consult reports and Test reports for all details in brief  HPI  from the history and physical done on the day of admission 07/18/2018  HPI: [name] is a 58 y.o. male with history h/o polysubstance abuse including cocaine, diabetes mellitus, hepatitis C, systolic CHF, schizophrenia, homelessness, noncompliance with medications who was admitted to [facility] long hospital last week for anasarca/CHF exacerbation and  treated with IV diuretics, signed out AMA yesterday as he was concerned about losing housing option.  Patient states he secured a long-term motel room and came back today for continued treatment.  He also reports persistent severe scrotal swelling and now with scrotal pain which he rates at 8/10 especially while voiding.  He states he has had frequent urination/dysuria.  He also reports lower abdominal discomfort but no associated nausea or vomiting.  He admits to recent cocaine use but denies using yesterday.  He states he was on insulin for diabetes in the past but does not know the specifics.  His blood glucose was elevated in the 300s at [place] long.  He denies any chest pain currently.  He had mildly elevated troponin at [facility] which was felt secondary to cocaine use. He is requested to be admitted for further evaluation and management.   Hospital Course    58 y.o.malewith history h/opolysubstance abuse including cocaine, diabetes mellitus, hepatitis C, systolic CHF, schizophrenia, homelessness, noncompliance with medications who was admitted to [facility] long hospital last week for anasarca/CHF exacerbation and treated with IV diuretics, signed out  AMA yesterday as he was concerned about losing housing option. Patient states he secured a long-term motel room and came back today for continued treatment. He also reports persistent severe scrotal swelling and now with scrotal pain which he rates at 8/10 especially while voiding. He states he has had frequent urination/dysuria. He also reports lower abdominal discomfort but no associated nausea or vomiting. He admits to recent cocaine use but denies using yesterday. He states he was on insulin for diabetes in the past but does not know the specifics. His blood glucose was elevated in the 300s at [facility]. He denies any chest pain currently. [facility] use. [facility], now is [facility], he was admitted for volume overload, he was appropriately diuresed, is severely noncompliant with diet, fluid restriction and low-salt diet, carb modified diet, he has been belligerent, and abusive to staff on multiple occasions.   Acute on chronic systolic CHF exacerbation:  - Last echo from September 2019 showed EF of 30 to 35%.  -Currently volume loaded on admission, he was diuresed very well, scrotal edema totally resolved, he only has mild pitting edema today, denies any inaccurate given he is noncompliant keeping track of his urine output, but overall weight significantly improved, he is -18 pounds during hospital stay. -Continue with ACE inhibitor, certainly no beta-blockers in the setting of recurrent cocaine use   Uncontrolled diabetes mellitus type 2:  - Unclear baseline insulin regimen.   CBG uncontrolled due to noncompliance, his Lantus has been uptitrated, CBG appears to be currently controlled on 20 units of Lantus.  Lower abdominal pain/scrotal discomfort: -Other scrotal pain during hospital stay, ultrasound suspicious for old left scrotum trauma, it is on nontender physical exam , his pain most likely was related to swelling in his scrotum, this has totally resolved with diuresis.  Schizophrenia:  -Patient awake alert x3 today, currently denies any hallucinations. -He has no bizarre behaviors, denies any hallucinations, no suicidal thoughts or ideations as well. 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 -Patient with belligerent behavior towards staff notable  occasions    Discharge Condition:  Stable at time of discharge -He is high risk for readmission given his substance abuse, medication and diet noncompliance   Follow UP  Follow-up Information    [name], [name], NP Follow up in 1 week(s).   Contact information: [address] [place] [id number] [phone]             Discharge Instructions  and  Discharge Medications   Discharge Instructions    Discharge instructions   Complete by:  As directed    Follow with Primary MD [name], [name], NP in 7 days   Get CBC, CMP,checked  by Primary MD next visit.    Activity: As tolerated with Full fall precautions use walker/cane & assistance as needed   Disposition Home/motel   Diet: Heart Healthy , low salt, card modofied .  For Heart failure patients - Check your Weight same time everyday, if you gain over 2 pounds, or you develop in  leg swelling, experience more shortness of breath or chest pain, call your Primary MD immediately. Follow Cardiac Low Salt Diet and 1.5 lit/day fluid restriction.   On your next visit with your primary care physician please Get Medicines reviewed and adjusted.   Please request your Prim.MD to go over all Hospital Tests and Procedure/Radiological results at the follow up, please get all Hospital records sent to your Prim MD by signing hospital release before you go home.   If you experience worsening of your admission symptoms, develop shortness of breath, life threatening emergency, suicidal or homicidal thoughts you must seek medical attention immediately by calling 911 or calling your MD immediately  if symptoms less severe.  You Must read complete instructions/literature along with all the possible adverse reactions/side effects for all the Medicines you take and that have been prescribed to you. Take any new Medicines after you have completely understood and accpet all the possible adverse reactions/side effects.   Do not  drive, operating heavy machinery, perform activities at heights, swimming or participation in water activities or provide baby sitting services if your were admitted for syncope or siezures until you have seen by Primary MD or a Neurologist and advised to do so again.  Do not drive when taking Pain medications.    Do not take more than prescribed Pain, Sleep and Anxiety Medications  Special Instructions: If you have smoked or chewed Tobacco  in the last 2 yrs please stop smoking, stop any regular Alcohol  and or any Recreational drug use.  Wear Seat belts while driving.   Please note  You were cared for by a hospitalist during your hospital stay. If you have any questions about your discharge medications or the care you received while you were in the hospital after you are discharged, you can call the unit and asked to speak with the hospitalist on call if the hospitalist that took care of you is not available. Once you are discharged, your primary care physician will handle any further medical issues. Please note that NO REFILLS for any discharge medications will be authorized once you are discharged, as it is imperative that you return to your primary care physician (or establish a relationship with a primary care physician if you do not have one) for your aftercare needs so that they can reassess your need for medications and monitor your lab values.   Increase activity slowly   Complete by:  As directed      Allergies as of 07/21/2018      Reactions   Haldol [haloperidol] Other (See Comments)   Muscle spasms, loss of voluntary movement. However, pt has taken Thorazine on multiple occasions with no adverse effects.       Medication List    TAKE these medications   acetaminophen 325 MG tablet Commonly known as:  TYLENOL Take 2 tablets (650 mg total) by mouth every 6 (six) hours as needed.   amLODipine 10 MG tablet Commonly known as:  NORVASC Take 1 tablet (10 mg total) by mouth  daily. Start taking on:  July 22, 2018   aspirin EC 81 MG tablet Take 1 tablet (81 mg total) by mouth daily.   atorvastatin 40 MG tablet Commonly known as:  LIPITOR Take 1 tablet (40 mg total) by mouth daily.   furosemide 20 MG tablet Commonly known as:  LASIX Take 40 mg oral daily, and 20 mg every evening What changed:    how much to take  how to take  this  when to take this  additional instructions   insulin glargine 100 UNIT/ML injection Commonly known as:  LANTUS Inject 0.2 mLs (20 Units total) into the skin daily.   lisinopril 5 MG tablet Commonly known as:  PRINIVIL,ZESTRIL Take 1 tablet (5 mg total) by mouth daily.         Diet and Activity recommendation: See Discharge Instructions above   Consults obtained - None   Major procedures and Radiology Reports - PLEASE review detailed and final reports for all details, in brief -      Dg Chest 2 View  Result Date: 07/15/2018 CLINICAL DATA:  Shortness of breath, lower extremity swelling EXAM: CHEST - 2 VIEW COMPARISON:  03/28/2018 FINDINGS: Prominent interstitial markings. Mild lingular and right infrahilar opacities. The overall appearance suggests mild perihilar edema. No definite pleural effusions. No pneumothorax. Cardiomegaly. Mild degenerative changes of the visualized thoracolumbar spine. IMPRESSION: Cardiomegaly with suspected mild perihilar edema. Electronically Signed   By: Charline Bills M.D.   On: 07/15/2018 03:41   Dg Abd Portable 2v  Result Date: 07/18/2018 CLINICAL DATA:  Abdominal pain. History of hypertension and diabetes. EXAM: PORTABLE ABDOMEN - 2 VIEW COMPARISON:  Nov 15, 202019 FINDINGS: There is no bowel dilation to suggest obstruction. No air-fluid levels or free air. No adynamic ileus. Mild to moderate generalized increased stool burden in the colon. No evidence of renal or ureteral stones. No acute skeletal abnormality. IMPRESSION: 1. No acute findings.  No evidence of bowel  obstruction or free air. 2. Mild to moderate increased stool burden throughout the colon. Electronically Signed   By: Amie Portland M.D.   On: 07/18/2018 11:02   US Scrotum W/doppler  Addendum Date: 07/18/2018   ADDENDUM REPORT: 07/18/2018 20:37 ADDENDUM: Numerous attempts were made to speak to the ordering physician and the hospitalist. Neither returned my calls. I did speak to both the sonographer and the nurse to confirm that the patient's pain does not localize to a specific testicle. Rather, it is diffuse and thought to be associated with the swelling. Electronically Signed   By: Gerome Sam III M.D   On: 07/18/2018 20:37   Result Date: 07/18/2018 CLINICAL DATA:  Diffuse scrotal pain, not localizing to either testicle by report. Scrotal swelling. EXAM: SCROTAL ULTRASOUND DOPPLER ULTRASOUND OF THE TESTICLES TECHNIQUE: Complete ultrasound examination of the testicles, epididymis, and other scrotal structures was performed. Color and spectral Doppler ultrasound were also utilized to evaluate blood flow to the testicles. COMPARISON:  None. FINDINGS: Right testicle Measurements: 4.0 x 3.0 x 3.1 cm. No mass or microlithiasis visualized. Left testicle Measurements: 2.7 x 1.9 x 2.5 cm. The left testicle is smaller than the right and mildly heterogeneous with no focal mass. Right epididymis: Contains a large epididymal cysts measuring 2.6 cm. Left epididymis:  No focal mass. Hydrocele:  Small right hydrocele.  No left hydrocele. Varicocele:  None visualized. Pulsed Doppler interrogation of both testes demonstrates low resistance arterial and venous blood flow bilaterally. There is more blood flow on the right than the left but good waveforms are seen bilaterally. IMPRESSION: 1. The left testicle is smaller than the right and mildly heterogeneous with no focal mass. Arterial and venous blood flow was documented in the left testicle although there is less blood flow on the left than the right. Given the history  of diffuse scrotal pain and swelling, not localizing to either specific testicle, I suspect the left testicle experienced a remote insult resulting in today's findings. I did call the sonographer  to ensure that the patient's pain did not localized to the left. Recommend clinical correlation by the clinical team to ensure that the patient's pain does not localize to 1 of the testicles. 2. There is more blood flow in the right testicle than the left. However, the blood flow on the right was thought to be normal. It was not thought to be abnormally increased at the time of imaging. 3. Diffuse scrotal swelling, likely resulting in the patient's diffuse pain. 4. Large 2.6 cm right epididymal cysts.  Small right hydrocele. Findings are being called to the referring clinical team. Electronically Signed: By: Gerome Sam III M.D On: 07/18/2018 19:47    Micro Results    Recent Results (from the past 240 hour(s))  MRSA PCR Screening     Status: None   Collection Time: 07/18/18  8:44 AM  Result Value Ref Range Status   MRSA by PCR NEGATIVE NEGATIVE Final    Comment:        The GeneXpert MRSA Assay (FDA approved for NASAL specimens only), is one component of a comprehensive MRSA colonization surveillance program. It is not intended to diagnose MRSA infection nor to guide or monitor treatment for MRSA infections. Performed at [facility], [address], [place], [place] [address]        Today   Subjective:   [name] today has no headache,no chest or abdominal pain, planes of scrotal pain today  Objective:   Blood pressure 140/89, pulse 92, temperature 98 F (36.7 C), temperature source Oral, resp. rate 20, height 5\' 9"  (1.753 m), weight 79.7 kg, SpO2 93 %.   Intake/Output Summary (Last 24 hours) at 07/21/2018 1113 Last data filed at 07/21/2018 1000 Gross per 24 hour  Intake 1560 ml  Output 3050 ml  Net -1490 ml    Exam Awake Alert, Oriented x 3, No new F.N  deficits, Normal affect Symmetrical Chest wall movement, Good air movement bilaterally, CTAB RRR,No Gallops,Rubs or new Murmurs, No Parasternal Heave +ve B.Sounds, Abd Soft, Non tender,  No rebound -guarding or rigidity. No Cyanosis, Clubbing , trace pedal edema, No new Rash or bruise -Scrotal edema has resolved  Data Review   CBC w Diff:  Lab Results  Component Value Date   WBC 13.0 (H) 07/18/2018   HGB 10.6 (L) 07/18/2018   HCT 33.7 (L) 07/18/2018   PLT 347 07/18/2018   LYMPHOPCT 8 07/18/2018   BANDSPCT 0 05/30/2008   MONOPCT 6 07/18/2018   EOSPCT 1 07/18/2018   BASOPCT 0 07/18/2018    CMP:  Lab Results  Component Value Date   NA 137 07/21/2018   K 3.5 07/21/2018   CL 97 (L) 07/21/2018   CO2 31 07/21/2018   BUN 25 (H) 07/21/2018   CREATININE 0.97 07/21/2018   PROT 5.8 (L) 07/15/2018   ALBUMIN 2.7 (L) 07/15/2018   BILITOT 1.0 07/15/2018   ALKPHOS 146 (H) 07/15/2018   AST 104 (H) 07/15/2018   ALT 86 (H) 07/15/2018  .   Total Time in preparing paper work, data evaluation and todays exam - 35 minutes  Huey Bienenstock M.D on 07/21/2018 at 11:13 AM  [facility]   Office  [phone]

## 2018-07-21 NOTE — Progress Notes (Signed)
Patient yelling out in his room. Demanding more snacks, more food. Non compliant with diet and fluid intake.  Pt badgering staff for snacks.  MD aware of patient non compliance.

## 2018-07-21 NOTE — [place] FL2 (Signed)
[place] MEDICAID FL2 LEVEL OF CARE SCREENING TOOL     IDENTIFICATION  Patient Name: [name] Birthdate: [birth year] Sex: male Admission Date (Current Location): 07/18/2018  [facility] and [place] Number:  Producer, television/film/video and Address:  [facility], [address] [place], [place] [address]      Provider Number: [id number]  Attending Physician Name and Address:  Elgergawy, Leana Roe, MD  Relative Name and Phone Number:       Current Level of Care: Other (Comment)(Patient requesting group home placement) Recommended Level of Care:   Prior Approval Number:    Date Approved/Denied:   PASRR Number:    Discharge Plan: Other (Comment)(Patient requesting group home placement)    Current Diagnoses: Patient Active Problem List   Diagnosis Date Noted  . CHF exacerbation (HCC) 07/18/2018  . CHF (congestive heart failure) (HCC) 07/15/2018  . Acute exacerbation of CHF (congestive heart failure) (HCC) 03/28/2018  . Acute on chronic heart failure (HCC) 03/16/2018  . Prostate enlargement 03/16/2018  . Aortic atherosclerosis (HCC) 03/16/2018  . 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 Cocaine abuse with cocaine-induced mood disorder (HCC) 09/18/2017  . Chronic foot pain   . Schizoaffective disorder, bipolar type (HCC) 09/30/2016  . Suicidal thoughts   . Substance induced mood disorder (HCC) 03/13/2015  . Acute kidney failure (HCC) 01/26/2015  . Schizophrenia, paranoid type (HCC) 01/17/2015  . Suicidal ideation   . Drug hallucinosis (HCC) 10/08/2014  . Chronic paranoid schizophrenia (HCC) 09/07/2014  . 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 Acute CHF (congestive heart failure) (HCC) 07/29/2014  . Urinary retention   . Cocaine use disorder, severe, dependence (HCC)   . Cocaine abuse (HCC) 03/10/2014  . Essential hypertension, benign 03/28/2013  . Diabetes mellitus  (HCC) 03/15/2013    Orientation RESPIRATION BLADDER Height & Weight     Self, Situation, Place  Normal Continent Weight: 175 lb 12.8 oz (79.7 kg) Height:  5\' 9"  (175.3 cm)  BEHAVIORAL SYMPTOMS/MOOD NEUROLOGICAL BOWEL NUTRITION STATUS    (None) Continent Diet(Heart healthy/carb modified)  AMBULATORY STATUS COMMUNICATION OF NEEDS Skin   Independent Verbally Other (Comment)(Excoriated.)                       Personal Care Assistance Level of Assistance              Functional Limitations Info  Sight, Hearing, Speech Sight Info: Adequate Hearing Info: Adequate Speech Info: Adequate    SPECIAL CARE FACTORS FREQUENCY                       Contractures Contractures Info: Not present    Additional Factors Info  Code Status, Allergies, Psychotropic Code Status Info: Full code Allergies Info: Haldol (Haloperidol) Psychotropic Info: Chronic Paranoid Schizophrenia: Hydroxyzine 25 mg PO TID prn.         Current Medications (07/21/2018):  This is the current hospital active medication list Current Facility-Administered Medications  Medication Dose Route Frequency Provider Last Rate Last Dose  . acetaminophen (TYLENOL) tablet 650 mg  650 mg Oral Q6H PRN Alessandra Bevels, MD   650 mg at 07/21/18 0710  . alum & mag hydroxide-simeth (MAALOX/MYLANTA) 200-200-20 MG/5ML suspension 30 mL  30 mL Oral Q4H PRN Alessandra Bevels, MD   30 mL at 07/18/18 1013  . amLODipine (NORVASC) tablet 10 mg  10 mg Oral Daily Elgergawy, Leana Roe, MD  10 mg at 07/21/18 0849  . aspirin EC tablet 81 mg  81 mg Oral Daily Alessandra Bevels, MD   81 mg at 07/21/18 0848  . atorvastatin (LIPITOR) tablet 40 mg  40 mg Oral Daily Alessandra Bevels, MD   40 mg at 07/21/18 0848  . enoxaparin (LOVENOX) injection 40 mg  40 mg Subcutaneous Q24H Alessandra Bevels, MD   40 mg at 07/21/18 0848  . furosemide (LASIX) injection 80 mg  80 mg Intravenous Q8H Elgergawy, Leana Roe, MD   80 mg at 07/21/18 0657  .  hydrALAZINE (APRESOLINE) injection 10 mg  10 mg Intravenous Q6H PRN Alessandra Bevels, MD      . HYDROcodone-acetaminophen (NORCO/VICODIN) 5-325 MG per tablet 1 tablet  1 tablet Oral Q6H PRN Alessandra Bevels, MD   1 tablet at 07/20/18 2058  . hydrOXYzine (ATARAX/VISTARIL) tablet 25 mg  25 mg Oral TID PRN Alessandra Bevels, MD   25 mg at 07/18/18 2302  . insulin aspart (novoLOG) injection 0-15 Units  0-15 Units Subcutaneous TID WC Alessandra Bevels, MD   3 Units at 07/21/18 0855  . insulin aspart (novoLOG) injection 0-5 Units  0-5 Units Subcutaneous QHS Alessandra Bevels, MD   2 Units at 07/19/18 2149  . insulin aspart (novoLOG) injection 4 Units  4 Units Subcutaneous TID WC Alessandra Bevels, MD   4 Units at 07/21/18 0854  . insulin glargine (LANTUS) injection 20 Units  20 Units Subcutaneous Daily Elgergawy, Leana Roe, MD   20 Units at 07/21/18 0850  . lisinopril (PRINIVIL,ZESTRIL) tablet 5 mg  5 mg Oral Daily Elgergawy, Leana Roe, MD   5 mg at 07/21/18 0847  . polyethylene glycol (MIRALAX / GLYCOLAX) packet 17 g  17 g Oral Daily PRN Alessandra Bevels, MD   17 g at 07/20/18 1407  . tamsulosin (FLOMAX) capsule 0.4 mg  0.4 mg Oral Daily Alessandra Bevels, MD   0.4 mg at 07/21/18 0848  . zolpidem (AMBIEN) tablet 5 mg  5 mg Oral QHS PRN Schorr, Roma Kayser, NP   5 mg at 07/20/18 2236     Discharge Medications: Please see discharge summary for a list of discharge medications.  Relevant Imaging Results:  Relevant Lab Results:   Additional Information SS#: [id number]  Margarito Liner, LCSW

## 2018-07-21 NOTE — Care Management Note (Signed)
Case Management Note  Patient Details  Name: [name] MRN: [id number] Date of Birth: 1961-04-18  Subjective/Objective:    DM, cocaine abuse, CHF                Action/Plan: NCM spoke to pt and states he goes to [facility] for his meds. PCP [name] NP. Pt reports that he was in a group home previous. CSW discussed with pt and provided resources. Contacted attending for meds to come from [facility].   Expected Discharge Date:  07/21/18               Expected Discharge Plan:  Homeless Shelter  In-House Referral:  Clinical Social Work  Discharge planning Services  CM Consult  Post Acute Care Choice:  NA Choice offered to:  NA  DME Arranged:  N/A DME Agency:  NA  HH Arranged:  NA HH Agency:  NA  Status of Service:  Completed, signed off  If discussed at [employer], dates discussed:    Additional Comments:  Elliot Cousin, RN 07/21/2018, 11:29 AM

## 2018-07-22 ENCOUNTER — Other Ambulatory Visit: Payer: Self-pay

## 2018-07-22 ENCOUNTER — Emergency Department (HOSPITAL_COMMUNITY)
Admission: EM | Admit: 2018-07-22 | Discharge: 2018-07-22 | Disposition: A | Discharge status: Home / Self Care | Payer: Medicaid Other | Attending: Emergency Medicine | Admitting: Emergency Medicine

## 2018-07-22 DIAGNOSIS — I509 Heart failure, unspecified: Secondary | ICD-10-CM | POA: Insufficient documentation

## 2018-07-22 DIAGNOSIS — I110 Hypertensive heart disease with heart failure: Secondary | ICD-10-CM | POA: Insufficient documentation

## 2018-07-22 DIAGNOSIS — Z7982 Long term (current) use of aspirin: Secondary | ICD-10-CM | POA: Insufficient documentation

## 2018-07-22 DIAGNOSIS — R109 Unspecified abdominal pain: Secondary | ICD-10-CM | POA: Diagnosis present

## 2018-07-22 DIAGNOSIS — F17210 Nicotine dependence, cigarettes, uncomplicated: Secondary | ICD-10-CM | POA: Diagnosis not present

## 2018-07-22 DIAGNOSIS — Z79899 Other long term (current) drug therapy: Secondary | ICD-10-CM | POA: Insufficient documentation

## 2018-07-22 DIAGNOSIS — Z7189 Other specified counseling: Secondary | ICD-10-CM | POA: Diagnosis not present

## 2018-07-22 DIAGNOSIS — E119 Type 2 diabetes mellitus without complications: Secondary | ICD-10-CM | POA: Diagnosis not present

## 2018-07-22 NOTE — ED Notes (Signed)
Pt states he was discharged yesterday for CHF. Pt denies any symptoms of this. Pt states his testicles are swollen and painful. Reports crack use yesterday. Pt states he needs to see a Child psychotherapist; was given FL2 to go to a shelter. Reports he stayed somewhere last night but got cold.

## 2018-07-22 NOTE — ED Provider Notes (Signed)
[facility] Provider Note   CSN: [id number] Arrival date & time: 07/22/18  0341    History   Chief Complaint Chief Complaint  Patient presents with  . Groin Pain    HPI [name] is a 58 y.o. male.  HPI   58 year old male with a past medical history of CHF diabetes, cocaine abuse, presents today with complaints of groin swelling.  Patient notes he was discharged from the hospital today for CHF exacerbation.  He notes he had swelling in his groin but this has resolved.  Denies any testicular or groin swelling presently.  Patient notes he was discharged with medications but does not know which medications he is supposed to take and states that he has never used this type of insulin pen and does not know how to use it.  Past Medical History:  Diagnosis Date  . CHF (congestive heart failure) (HCC)   . Chronic foot pain   . Cocaine abuse (HCC)   . Diabetes mellitus without complication (HCC)   . Hepatitis C   . Homelessness   . Hypertension   . Neuropathy   . Polysubstance abuse (HCC)   . Schizophrenia [facility](HCC)     Patient Active Problem List   Diagnosis Date Noted  . CHF exacerbation (HCC) 07/18/2018  . CHF (congestive heart failure) (HCC) 07/15/2018  . Acute exacerbation of CHF (congestive heart failure) (HCC) 03/28/2018  . Acute on chronic heart failure (HCC) 03/16/2018  . Prostate enlargement 03/16/2018  . Aortic atherosclerosis (HCC) 03/16/2018  . 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 Cocaine abuse with cocaine-induced mood disorder (HCC) 09/18/2017  . Chronic foot pain   . Schizoaffective disorder, bipolar type (HCC) 09/30/2016  . Suicidal thoughts   . Substance induced mood disorder (HCC) 03/13/2015  . Acute kidney failure (HCC) 01/26/2015  . Schizophrenia, paranoid type (HCC) 01/17/2015  . Suicidal ideation   . Drug hallucinosis (HCC) 10/08/2014  . Chronic paranoid schizophrenia (HCC) 09/07/2014  .  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 Acute CHF (congestive heart failure) (HCC) 07/29/2014  . Urinary retention   . Cocaine use disorder, severe, dependence (HCC)   . Cocaine abuse (HCC) 03/10/2014  . Essential hypertension, benign 03/28/2013  . Diabetes mellitus (HCC) 03/15/2013    Past Surgical History:  Procedure Laterality Date  . MULTIPLE TOOTH EXTRACTIONS          Home Medications    Prior to Admission medications   Medication Sig Start Date End Date Taking? Authorizing Provider  acetaminophen (TYLENOL) 325 MG tablet Take 2 tablets (650 mg total) by mouth every 6 (six) hours as needed. Patient not taking: Reported on 07/15/2018 04/01/18   Dietrich PatesKhatri, Hina, PA-C  amLODipine (NORVASC) 10 MG tablet Take 1 tablet (10 mg total) by mouth daily. 07/22/18   Elgergawy, Leana Roeawood S, MD  aspirin EC 81 MG tablet Take 1 tablet (81 mg total) by mouth daily. Patient not taking: Reported on 07/15/2018 03/30/18 03/30/19  Dorrell, Cathleen Cortieborah N, MD  atorvastatin (LIPITOR) 40 MG tablet Take 1 tablet (40 mg total) by mouth daily. Patient not taking: Reported on 07/15/2018 03/30/18   Dorrell, Cathleen Cortieborah N, MD  furosemide (LASIX) 20 MG tablet Take 40 mg oral daily, and 20 mg every evening 07/21/18   Elgergawy, Leana Roeawood S, MD  Insulin Glargine (LANTUS) 100 UNIT/ML Solostar Pen Inject 20 Units into the skin daily. 07/21/18   Elgergawy, Leana Roeawood S, MD  Insulin  Pen Needle 29G X MISC Use with insulin 07/21/18   Elgergawy, Leana Roe, MD  lisinopril (PRINIVIL,ZESTRIL) 5 MG tablet Take 1 tablet (5 mg total) by mouth daily. Patient not taking: Reported on 07/15/2018 03/31/18   Dorrell, Cathleen Corti, MD    Family History Family History  Problem Relation Age of Onset  . Hypertension Other   . Diabetes Other     Social History Social History   Tobacco Use  . Smoking status: Current Every Day Smoker    Packs/day: 1.00    Years: 20.00    Pack years:  20.00    Types: Cigarettes  . Smokeless tobacco: Current User  Substance Use Topics  . Alcohol use: Yes    Comment: Daily Drinker   . Drug use: Yes    Frequency: 7.0 times per week    Types: "Crack" cocaine, Cocaine, Marijuana    Comment: Cocaine tonight, Marijuana "a long time"     Allergies   Haldol [haloperidol]   Review of Systems Review of Systems  All other systems reviewed and are negative.   Physical Exam Updated Vital Signs BP 115/74   Pulse 75   Temp 98.5 F (36.9 C)   Resp 15   Ht 5\' 8"  (1.727 m)   Wt 83.9 kg   SpO2 99%   BMI 28.13 kg/m   Physical Exam Vitals signs and nursing note reviewed.  Constitutional:      Appearance: He is well-developed.  HENT:     Head: Normocephalic and atraumatic.  Eyes:     General: No scleral icterus.       Right eye: No discharge.        Left eye: No discharge.     Conjunctiva/sclera: Conjunctivae normal.     Pupils: Pupils are equal, round, and reactive to light.  Neck:     Musculoskeletal: Normal range of motion.     Vascular: No JVD.     Trachea: No tracheal deviation.  Pulmonary:     Effort: Pulmonary effort is normal.     Breath sounds: No stridor.  Neurological:     Mental Status: He is alert and oriented to person, place, and time.     Coordination: Coordination normal.  Psychiatric:        Behavior: Behavior normal.        Thought Content: Thought content normal.        Judgment: Judgment normal.      ED Treatments / Results  Labs (all labs ordered are listed, but only abnormal results are displayed) Labs Reviewed - No data to display  EKG None  Radiology No results found.  Procedures Procedures (including critical care time)  Medications Ordered in ED Medications - No data to display   Initial Impression / Assessment and Plan / ED Course  I have reviewed the triage vital signs and the nursing notes.  Pertinent labs & imaging results that were available during my care of the  patient were reviewed by me and considered in my medical decision making (see chart for details).     58 year old male presents today with questions about his diabetes and use of insulin device and management of his blood sugar.  Patient has no acute concerns for his groin at this time as his symptoms have improved.  Diabetic care education staff consulted for assistance with education.  Final Clinical Impressions(s) / ED Diagnoses   Final diagnoses:  Diabetes education, encounter for    ED Discharge Orders  None       [name], PA-C 07/22/18 1232    Melene Plan, DO 07/22/18 1605

## 2018-07-22 NOTE — Progress Notes (Signed)
Inpatient Diabetes Program Recommendations  AACE/ADA: New Consensus Statement on Inpatient Glycemic Control (2015)  Target Ranges:  Prepandial:   less than 140 mg/dL      Peak postprandial:   less than 180 mg/dL (1-2 hours)      Critically ill patients:  140 - 180 mg/dL   Lab Results  Component Value Date   GLUCAP 155 (H) 07/21/2018   HGBA1C 7.3 (H) 07/18/2018   [facility] and [facility] requesting insulin pen instruction for patient. Educated patient on insulin pen use at home. Reviewed all steps if insulin pen including attachment of needle, 2-unit air shot, dialing up dose, giving injection, removing needle, disposal of sharps, storage of unused insulin, disposal of insulin etc. Patient able to provide successful return demonstration. Also reviewed troubleshooting with insulin pen.  Patient has his own prescription of Lantus and pen needles @ bedside.  Thank you, Billy FischerJudy E. Aristotelis Vilardi, RN, MSN, CDE  Diabetes Coordinator Inpatient Glycemic Control Team Team Pager [phone] (8am-5pm) 07/22/2018 9:39 AM

## 2018-07-22 NOTE — Discharge Instructions (Signed)
Please read attached information. If you experience any new or worsening signs or symptoms please return to the emergency room for evaluation. Please follow-up with your primary care provider or specialist as discussed. Please use medication prescribed only as directed and discontinue taking if you have any concerning signs or symptoms.   °

## 2018-07-22 NOTE — ED Notes (Signed)
Diabetes coordinator at bedside. She advised this RN patient was able to perform insulin administration and she feels confident he understands well enough to medicate himself appropriately at this time.

## 2018-07-22 NOTE — ED Notes (Signed)
Pt given turkey sandwich

## 2018-07-22 NOTE — ED Notes (Signed)
This RN called diabetes coordinator to speak with pt about his medications.

## 2018-07-22 NOTE — ED Triage Notes (Signed)
Per ems pt was at the corner of [address]. He is complaining of Groin pain. Alert oriented x 4.

## 2018-07-22 NOTE — ED Notes (Signed)
Patient given sandwich and something to drink prior to discharge. Verbalized understanding of dc papers, ambulatory out of ED, vss, nad.

## 2018-07-23 ENCOUNTER — Encounter (HOSPITAL_COMMUNITY): Payer: Self-pay | Admitting: Emergency Medicine

## 2018-07-23 ENCOUNTER — Other Ambulatory Visit: Payer: Self-pay

## 2018-07-23 ENCOUNTER — Emergency Department (HOSPITAL_COMMUNITY): Payer: Medicaid Other

## 2018-07-23 ENCOUNTER — Emergency Department (HOSPITAL_COMMUNITY)
Admission: EM | Admit: 2018-07-23 | Discharge: 2018-07-23 | Disposition: A | Discharge status: Home / Self Care | Payer: Medicaid Other | Attending: Emergency Medicine | Admitting: Emergency Medicine

## 2018-07-23 DIAGNOSIS — Z794 Long term (current) use of insulin: Secondary | ICD-10-CM | POA: Diagnosis not present

## 2018-07-23 DIAGNOSIS — E1140 Type 2 diabetes mellitus with diabetic neuropathy, unspecified: Secondary | ICD-10-CM | POA: Diagnosis not present

## 2018-07-23 DIAGNOSIS — D649 Anemia, unspecified: Secondary | ICD-10-CM | POA: Insufficient documentation

## 2018-07-23 DIAGNOSIS — R109 Unspecified abdominal pain: Secondary | ICD-10-CM

## 2018-07-23 DIAGNOSIS — F17210 Nicotine dependence, cigarettes, uncomplicated: Secondary | ICD-10-CM | POA: Diagnosis not present

## 2018-07-23 DIAGNOSIS — Z79899 Other long term (current) drug therapy: Secondary | ICD-10-CM | POA: Diagnosis not present

## 2018-07-23 DIAGNOSIS — I509 Heart failure, unspecified: Secondary | ICD-10-CM | POA: Insufficient documentation

## 2018-07-23 DIAGNOSIS — R748 Abnormal levels of other serum enzymes: Secondary | ICD-10-CM | POA: Diagnosis not present

## 2018-07-23 DIAGNOSIS — I110 Hypertensive heart disease with heart failure: Secondary | ICD-10-CM | POA: Diagnosis not present

## 2018-07-23 LAB — COMPREHENSIVE METABOLIC PANEL
ALT: 27 U/L (ref 0–44)
AST: 47 U/L — ABNORMAL HIGH (ref 15–41)
Albumin: 3 g/dL — ABNORMAL LOW (ref 3.5–5.0)
Alkaline Phosphatase: 128 U/L — ABNORMAL HIGH (ref 38–126)
Anion gap: 9 (ref 5–15)
BUN: 28 mg/dL — ABNORMAL HIGH (ref 6–20)
CO2: 29 mmol/L (ref 22–32)
Calcium: 9 mg/dL (ref 8.9–10.3)
Chloride: 102 mmol/L (ref 98–111)
Creatinine, Ser: 0.98 mg/dL (ref 0.61–1.24)
GFR calc Af Amer: >60 mL/min (ref >60)
GFR calc non Af Amer: >60 mL/min (ref >60)
Glucose, Bld: 246 mg/dL — ABNORMAL HIGH (ref 70–99)
POTASSIUM: 3.7 mmol/L (ref 3.5–5.1)
Sodium: 140 mmol/L (ref 135–145)
Total Bilirubin: 0.8 mg/dL (ref 0.3–1.2)
Total Protein: 7.1 g/dL (ref 6.5–8.1)

## 2018-07-23 LAB — LIPASE, BLOOD: Lipase: 36 U/L (ref 11–51)

## 2018-07-23 LAB — CBC WITH DIFFERENTIAL/PLATELET
Abs Immature Granulocytes: 0.05 10*3/uL (ref 0.00–0.07)
Basophils Absolute: 0 10*3/uL (ref 0.0–0.1)
Basophils Relative: 0 %
EOS ABS: 0.1 10*3/uL (ref 0.0–0.5)
Eosinophils Relative: 1 %
HCT: 39.6 % (ref 39.0–52.0)
Hemoglobin: 11.6 g/dL — ABNORMAL LOW (ref 13.0–17.0)
Immature Granulocytes: 1 %
Lymphocytes Relative: 8 %
Lymphs Abs: 0.9 10*3/uL (ref 0.7–4.0)
MCH: 26.1 pg (ref 26.0–34.0)
MCHC: 29.3 g/dL — ABNORMAL LOW (ref 30.0–36.0)
MCV: 89 fL (ref 80.0–100.0)
Monocytes Absolute: 0.6 10*3/uL (ref 0.1–1.0)
Monocytes Relative: 5 %
Neutro Abs: 9.2 10*3/uL — ABNORMAL HIGH (ref 1.7–7.7)
Neutrophils Relative %: 85 %
PLATELETS: 306 10*3/uL (ref 150–400)
RBC: 4.45 MIL/uL (ref 4.22–5.81)
RDW: 17 % — AB (ref 11.5–15.5)
WBC: 10.8 10*3/uL — AB (ref 4.0–10.5)
nRBC: 0 % (ref 0.0–0.2)

## 2018-07-23 MED ORDER — ACETAMINOPHEN 325 MG PO TABS
650.0000 mg | ORAL_TABLET | Freq: Once | ORAL | Status: AC
  Administered 2018-07-23: 650 mg via ORAL
  Filled 2018-07-23: qty 2

## 2018-07-23 MED ORDER — IOHEXOL 300 MG/ML  SOLN
100.0000 mL | Freq: Once | INTRAMUSCULAR | Status: AC
  Administered 2018-07-23: 100 mL via INTRAVENOUS

## 2018-07-23 MED ORDER — ACETAMINOPHEN 325 MG PO TABS: 650.0000 mg | ORAL_TABLET | Freq: Once | ORAL | Status: DC

## 2018-07-23 MED ORDER — IBUPROFEN 400 MG PO TABS
600.0000 mg | ORAL_TABLET | Freq: Once | ORAL | Status: AC
  Administered 2018-07-23: 600 mg via ORAL
  Filled 2018-07-23: qty 1

## 2018-07-23 NOTE — ED Triage Notes (Signed)
Pt c/o hemorrhoids and epigastric pain, off & on for 30mo. Pt reports nausea, though noted to be eating crackers and drinking soda.

## 2018-07-23 NOTE — ED Notes (Signed)
To CT with tech
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
## 2018-07-23 NOTE — ED Provider Notes (Signed)
CT scan shows enlarged prostate.  Otherwise unremarkable vitals.  Patient with no urinary symptoms.  Came in for vague epigastric pain, possible hemorrhoids.  Patient was signed out to me awaiting final CT scan results.  Safe for discharge at this time. Given urology follow up.    Virgina Norfolk, DO 07/23/18 [phone]

## 2018-07-23 NOTE — Discharge Instructions (Signed)
Follow up with urology about prostate.

## 2018-07-24 ENCOUNTER — Other Ambulatory Visit: Payer: Self-pay

## 2018-07-24 ENCOUNTER — Encounter (HOSPITAL_COMMUNITY): Payer: Self-pay | Admitting: *Deleted

## 2018-07-24 ENCOUNTER — Emergency Department (HOSPITAL_COMMUNITY): Payer: Medicaid Other

## 2018-07-24 ENCOUNTER — Emergency Department (HOSPITAL_COMMUNITY)
Admission: EM | Admit: 2018-07-24 | Discharge: 2018-07-24 | Disposition: A | Discharge status: Home / Self Care | Payer: Medicaid Other | Attending: Emergency Medicine | Admitting: Emergency Medicine

## 2018-07-24 DIAGNOSIS — R440 Auditory hallucinations: Secondary | ICD-10-CM | POA: Insufficient documentation

## 2018-07-24 DIAGNOSIS — I509 Heart failure, unspecified: Secondary | ICD-10-CM | POA: Insufficient documentation

## 2018-07-24 DIAGNOSIS — Z794 Long term (current) use of insulin: Secondary | ICD-10-CM

## 2018-07-24 DIAGNOSIS — I110 Hypertensive heart disease with heart failure: Secondary | ICD-10-CM

## 2018-07-24 DIAGNOSIS — F209 Schizophrenia, unspecified: Secondary | ICD-10-CM

## 2018-07-24 DIAGNOSIS — E1140 Type 2 diabetes mellitus with diabetic neuropathy, unspecified: Secondary | ICD-10-CM | POA: Insufficient documentation

## 2018-07-24 DIAGNOSIS — R45851 Suicidal ideations: Secondary | ICD-10-CM

## 2018-07-24 DIAGNOSIS — Z79899 Other long term (current) drug therapy: Secondary | ICD-10-CM | POA: Insufficient documentation

## 2018-07-24 DIAGNOSIS — F17210 Nicotine dependence, cigarettes, uncomplicated: Secondary | ICD-10-CM | POA: Insufficient documentation

## 2018-07-24 DIAGNOSIS — J020 Streptococcal pharyngitis: Secondary | ICD-10-CM | POA: Insufficient documentation

## 2018-07-24 DIAGNOSIS — K029 Dental caries, unspecified: Secondary | ICD-10-CM | POA: Insufficient documentation

## 2018-07-24 DIAGNOSIS — R0981 Nasal congestion: Secondary | ICD-10-CM | POA: Diagnosis present

## 2018-07-24 LAB — GROUP A STREP BY PCR: Group A Strep by PCR: DETECTED — AB

## 2018-07-24 MED ORDER — ACETAMINOPHEN 325 MG PO TABS
650.0000 mg | ORAL_TABLET | Freq: Once | ORAL | Status: AC
  Administered 2018-07-24: 650 mg via ORAL
  Filled 2018-07-24: qty 2

## 2018-07-24 MED ORDER — IBUPROFEN 400 MG PO TABS: 400.0000 mg | ORAL_TABLET | Freq: Four times a day (QID) | ORAL | 0 refills | Status: DC | PRN

## 2018-07-24 MED ORDER — ACETAMINOPHEN 500 MG PO TABS: 500.0000 mg | ORAL_TABLET | Freq: Four times a day (QID) | ORAL | 0 refills | Status: DC | PRN

## 2018-07-24 MED ORDER — PENICILLIN G BENZATHINE & PROC 1200000 UNIT/2ML IM SUSP
1.2000 10*6.[IU] | Freq: Once | INTRAMUSCULAR | Status: AC
  Administered 2018-07-24: 1.2 10*6.[IU] via INTRAMUSCULAR
  Filled 2018-07-24: qty 2

## 2018-07-24 NOTE — ED Notes (Signed)
Patient verbalizes understanding of discharge instructions. Opportunity for questioning and answers were provided. Armband removed by staff, pt discharged from ED ambulatory.   

## 2018-07-24 NOTE — ED Notes (Signed)
Message sent to pharmacy to verify and send meds so pt may be discharged

## 2018-07-24 NOTE — ED Triage Notes (Signed)
Pt reports nasal congestion and cough for 3 days.
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
## 2018-07-24 NOTE — Discharge Instructions (Addendum)
You have been given a single dose of penicillin to treat strep throat.  You can take ibuprofen and Tylenol as prescribed over-the-counter, as needed for pain.  Please return the emergency department you develop any new or worsening symptoms including masses in your neck, asymmetry in your throat, drooling, or any other concerning symptoms.

## 2018-07-25 ENCOUNTER — Emergency Department (HOSPITAL_COMMUNITY)
Admission: EM | Admit: 2018-07-25 | Discharge: 2018-07-25 | Disposition: A | Discharge status: Home / Self Care | Payer: Medicaid Other | Source: Home / Self Care | Attending: Emergency Medicine | Admitting: Emergency Medicine

## 2018-07-25 ENCOUNTER — Other Ambulatory Visit: Payer: Self-pay

## 2018-07-25 ENCOUNTER — Encounter (HOSPITAL_COMMUNITY): Payer: Self-pay | Admitting: Emergency Medicine

## 2018-07-25 DIAGNOSIS — K029 Dental caries, unspecified: Secondary | ICD-10-CM

## 2018-07-25 DIAGNOSIS — R45851 Suicidal ideations: Secondary | ICD-10-CM

## 2018-07-25 MED ORDER — PENICILLIN V POTASSIUM 250 MG PO TABS
500.0000 mg | ORAL_TABLET | Freq: Once | ORAL | Status: AC
  Administered 2018-07-25: 500 mg via ORAL
  Filled 2018-07-25: qty 2

## 2018-07-25 MED ORDER — ACETAMINOPHEN 325 MG PO TABS
650.0000 mg | ORAL_TABLET | Freq: Once | ORAL | Status: AC
  Administered 2018-07-25: 650 mg via ORAL
  Filled 2018-07-25: qty 2

## 2018-07-25 NOTE — ED Triage Notes (Signed)
C/o R upper dental pain.  States he has a tooth that is broke off.  Also reports hearing voices and suicidal ideation.  Melvenia Beam, PA at triage to assess pt.
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
## 2018-07-25 NOTE — ED Notes (Signed)
Denies SI at this time, states is ready for d/c and does not want to wait for social work

## 2018-07-25 NOTE — ED Provider Notes (Signed)
Patient cleared by psychiatry.  Does not want to stay for social work as his main problem is homelessness since being released from prison.   Patient was given something to eat and discharged in ED in good condition.  No longer suicidal.   Virgina Norfolk, DO 07/25/18 [phone]
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
## 2018-07-25 NOTE — ED Notes (Signed)
Pt's belongings bagged, labeled, and placed in locker #1.
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
## 2018-07-25 NOTE — ED Notes (Signed)
Diet tray ordered for pt 

## 2018-07-26 ENCOUNTER — Other Ambulatory Visit: Payer: Self-pay

## 2018-07-26 ENCOUNTER — Emergency Department (HOSPITAL_COMMUNITY)
Admission: EM | Admit: 2018-07-26 | Discharge: 2018-07-26 | Disposition: A | Discharge status: Home / Self Care | Payer: Medicaid Other | Attending: Emergency Medicine | Admitting: Emergency Medicine

## 2018-07-26 ENCOUNTER — Encounter (HOSPITAL_COMMUNITY): Payer: Self-pay | Admitting: *Deleted

## 2018-07-26 DIAGNOSIS — I110 Hypertensive heart disease with heart failure: Secondary | ICD-10-CM | POA: Diagnosis not present

## 2018-07-26 DIAGNOSIS — Z5900 Homelessness unspecified: Secondary | ICD-10-CM

## 2018-07-26 DIAGNOSIS — E119 Type 2 diabetes mellitus without complications: Secondary | ICD-10-CM | POA: Insufficient documentation

## 2018-07-26 DIAGNOSIS — J029 Acute pharyngitis, unspecified: Secondary | ICD-10-CM | POA: Insufficient documentation

## 2018-07-26 DIAGNOSIS — F17210 Nicotine dependence, cigarettes, uncomplicated: Secondary | ICD-10-CM | POA: Insufficient documentation

## 2018-07-26 DIAGNOSIS — I509 Heart failure, unspecified: Secondary | ICD-10-CM | POA: Insufficient documentation

## 2018-07-26 MED ORDER — ACETAMINOPHEN 325 MG PO TABS
325.0000 mg | ORAL_TABLET | Freq: Once | ORAL | Status: AC
  Administered 2018-07-26: 325 mg via ORAL
  Filled 2018-07-26: qty 1

## 2018-07-26 MED ORDER — ACETAMINOPHEN 325 MG PO TABS
650.0000 mg | ORAL_TABLET | Freq: Once | ORAL | Status: AC
  Administered 2018-07-26: 325 mg via ORAL
  Filled 2018-07-26: qty 2
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
## 2018-07-26 NOTE — Discharge Instructions (Signed)
Substance Abuse Treatment Programs  Intensive Outpatient Programs [facility]     [address]      [place], [place]                   [phone]       [facility] [address] [place] [address] [place], [place] [phone]  [facility]     (Inpatient and outpatient)     [address]           [phone]    [facility] [phone] (Suboxone and Methadone)  [address]      [place], [place] [address]      [phone]       [address] [place], [place] [phone]  [facility] (Outpatient/Inpatient, Chemical)    (insurance only) [phone]             [facility] (Groups & Residential) [place], [place] [phone]     [facility]     [address]     [place], [place]      [phone]       [facility] (for caregivers and family) [phone] [address] [place], [place] [phone]      Residential Treatment Programs [facility]      [address], [place], [place] [address]  [phone]       [facility] [address] [place], [place] [address] [phone]  [facility] [place]        [phone]       [facility] [phone]  [facility]             [address]                                         [place], [place]                                                [phone] or [phone]                               [facility] [address] [place], [address] [phone]  [facility]    [address]      [place], [place]     [phone]       [facility] [address] [place], [place] [phone]  [facility]   [address] [place], [place] [address]     [phone]      Admissions: 8am-3pm M-F  [facility] ([facility]) [address] [place],  [place] [phone]  [facility]: Residential Program [phone] Days)   [place], [place]      [phone] or [phone]     [facility]: [facility] [place], [place] (Walk in Hours over the weekend or by referral) HOMELESS SHELTERS  [facility]     [facility]   [address], [place]     [phone]              [employer] (women and children)  [address] [place], [place] [address] [phone] [email or web address] for application and process Application Required  [facility]   [address]    [place] [address]     [phone]                    [facility] [address] [place], [place] [address] [phone] application appt.) Application Required  [facility] (women only)    [address]     [place], [place] [address]     [phone]      Intake starts 6pm daily Need valid ID, SSC, & Police report [employer] [address] [place], [place] [phone] Application Required  [employer] (men only)     [address]      [place], [place]     [phone]       [facility] (Pregnant women only) [address] [place], [place] [phone]  [facility]      [address]      [place], [place] [address]     [phone]             [facility] [address] [place], [place] [phone] 90 day commitment/SA/Application process  [facility](men only)     [address]     [place], [place]     [phone]       Check-in at [facility] [address] [place], [place] [address] [phone] Men/Women/Women and Children must be there by 7 pm  [facility] [place], [place] [phone]

## 2018-07-26 NOTE — ED Provider Notes (Signed)
[facility]PT Provider Note   CSN: [id number] Arrival date & time: 07/26/18  0416     History   Chief Complaint Chief Complaint  Patient presents with  . Suicidal    HPI JAMOL REIM is a 58 y.o. male.  HPI  58 year old male with history of CHF, polysubstance abuse, diabetes was homeless comes in with chief complaint of SI.  Upon further questioning the patient, as he was just seen yesterday for SI, he agrees that he is in the ER because he is homeless.  He is also complaining of sore throat and wants something for that.  Patient was seen 2 days ago in the ER with sore throat and diagnosed with strep pharyngitis and given IM penicillin.  Past Medical History:  Diagnosis Date  . CHF (congestive heart failure) (HCC)   . Chronic foot pain   . Cocaine abuse (HCC)   . Diabetes mellitus without complication (HCC)   . Hepatitis C   . Homelessness   . Hypertension   . Neuropathy   . Polysubstance abuse (HCC)   . Schizophrenia [facility])     Patient Active Problem List   Diagnosis Date Noted  . CHF exacerbation (HCC) 07/18/2018  . CHF (congestive heart failure) (HCC) 07/15/2018  . Acute exacerbation of CHF (congestive heart failure) (HCC) 03/28/2018  . Acute on chronic heart failure (HCC) 03/16/2018  . Prostate enlargement 03/16/2018  . Aortic atherosclerosis (HCC) 03/16/2018  . 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 Cocaine abuse with cocaine-induced mood disorder (HCC) 09/18/2017  . Chronic foot pain   . Schizoaffective disorder, bipolar type (HCC) 09/30/2016  . Suicidal thoughts   . Substance induced mood disorder (HCC) 03/13/2015  . Acute kidney failure (HCC) 01/26/2015  . Schizophrenia, paranoid type (HCC) 01/17/2015  . Suicidal ideation   . Drug hallucinosis (HCC) 10/08/2014  . Chronic paranoid schizophrenia (HCC) 09/07/2014  . Substance or medication-induced bipolar and related disorder with onset during intoxication  (HCC) 08/10/2014  . Acute CHF (congestive heart failure) (HCC) 07/29/2014  . Urinary retention   . Cocaine use disorder, severe, dependence (HCC)   . Cocaine abuse (HCC) 03/10/2014  . Essential hypertension, benign 03/28/2013  . Diabetes mellitus (HCC) 03/15/2013    Past Surgical History:  Procedure Laterality Date  . MULTIPLE TOOTH EXTRACTIONS          Home Medications    Prior to Admission medications   Medication Sig Start Date End Date Taking? Authorizing Provider  acetaminophen (TYLENOL) 500 MG tablet Take 1 tablet (500 mg total) by mouth every 6 (six) hours as needed. Patient not taking: Reported on 07/25/2018 07/24/18   Emi Holes, PA-C  amLODipine (NORVASC) 10 MG tablet Take 1 tablet (10 mg total) by mouth daily. Patient not taking: Reported on 07/23/2018 07/22/18   Elgergawy, Leana Roe, MD  aspirin EC 81 MG tablet Take 1 tablet (81 mg total) by mouth daily. Patient not taking: Reported on 07/15/2018 03/30/18 03/30/19  Dorrell, Cathleen Corti, MD  atorvastatin (LIPITOR) 40 MG tablet Take 1 tablet (40 mg total) by mouth daily. Patient not taking: Reported on 07/15/2018 03/30/18   Dorrell, Cathleen Corti, MD  furosemide (LASIX) 20 MG tablet Take 40 mg oral daily, and 20 mg every evening Patient not taking: Reported on 07/23/2018 07/21/18   Elgergawy, Leana Roe, MD  ibuprofen (ADVIL,MOTRIN) 400 MG tablet Take 1 tablet (400 mg total) by mouth every 6 (six) hours as needed. 07/24/18   Emi Holes,  PA-C  Insulin Glargine (LANTUS) 100 UNIT/ML Solostar Pen Inject 20 Units into the skin daily. Patient not taking: Reported on 07/23/2018 07/21/18   Elgergawy, Leana Roe, MD  Insulin Pen Needle 29G X MISC Use with insulin Patient not taking: Reported on 07/26/2018 07/21/18   Elgergawy, Leana Roe, MD  lisinopril (PRINIVIL,ZESTRIL) 5 MG tablet Take 1 tablet (5 mg total) by mouth daily. Patient not taking: Reported on 07/15/2018 03/31/18   Dorrell, Cathleen Corti, MD    Family History Family History    Problem Relation Age of Onset  . Hypertension Other   . Diabetes Other     Social History Social History   Tobacco Use  . Smoking status: Current Every Day Smoker    Packs/day: 1.00    Years: 20.00    Pack years: 20.00    Types: Cigarettes  . Smokeless tobacco: Current User  Substance Use Topics  . Alcohol use: Yes    Comment: Daily Drinker   . Drug use: Yes    Frequency: 7.0 times per week    Types: "Crack" cocaine, Cocaine, Marijuana    Comment: Cocaine tonight, Marijuana "a long time"     Allergies   Haldol [haloperidol]   Review of Systems Review of Systems  Constitutional: Negative for activity change.  Respiratory: Negative for shortness of breath.   Cardiovascular: Negative for chest pain.  Psychiatric/Behavioral: Negative for self-injury and suicidal ideas.     Physical Exam Updated Vital Signs BP (!) 150/96 (BP Location: Right Arm)   Pulse 98   Temp 98.2 F (36.8 C) (Oral)   Resp 20   Ht 5\' 8"  (1.727 m)   Wt 84 kg   SpO2 93%   BMI 28.16 kg/m   Physical Exam Vitals signs and nursing note reviewed.  Constitutional:      Appearance: He is well-developed.  HENT:     Head: Atraumatic.  Neck:     Musculoskeletal: Neck supple.  Cardiovascular:     Rate and Rhythm: Normal rate.  Pulmonary:     Effort: Pulmonary effort is normal.  Skin:    General: Skin is warm.  Neurological:     Mental Status: He is alert and oriented to person, place, and time.      ED Treatments / Results  Labs (all labs ordered are listed, but only abnormal results are displayed) Labs Reviewed - No data to display  EKG None  Radiology Dg Chest 2 View  Result Date: 07/24/2018 CLINICAL DATA:  Cough and intermittent shortness of breath EXAM: CHEST - 2 VIEW COMPARISON:  07/15/2018 FINDINGS: Chronic cardiomegaly and mild lung scarring. Chronic hilar prominence that is likely vascular. There is no edema, consolidation, effusion, or pneumothorax. Stable metallic body  overlapping the anterior right base. IMPRESSION: 1. Baseline appearance of the chest. 2. Cardiomegaly and mild lung scarring. Electronically Signed   By: [name]   On: 07/24/2018 06:46    Procedures Procedures (including critical care time)  Medications Ordered in ED Medications  acetaminophen (TYLENOL) tablet 650 mg (has no administration in time range)     Initial Impression / Assessment and Plan / ED Course  I have reviewed the triage vital signs and the nursing notes.  Pertinent labs & imaging results that were available during my care of the patient were reviewed by me and considered in my medical decision making (see chart for details).     58 year old comes in with chief complaint of SI.  He was just seen  yesterday, assessed by psychiatry team and discharged.  Upon further questioning it seems like he is here mainly because he is homeless.  We will discharge the patient. He was asked to wait for social worker yesterday, however patient left.  Today there is no Child psychotherapistsocial worker available in the ER.  Have advised patient to use the resources we provided him.  Final Clinical Impressions(s) / ED Diagnoses   Final diagnoses:  Homeless    ED Discharge Orders    None       Derwood KaplanNanavati, Linzey Ramser, MD 07/26/18 [id number]

## 2018-07-26 NOTE — ED Notes (Signed)
18g SL removed from LAC, catheter intact, site unremarkable, gauze dressing applied.

## 2018-07-26 NOTE — ED Notes (Signed)
Bed: WTR5 Expected date:  Expected time:  Means of arrival:  Comments: 

## 2018-07-27 ENCOUNTER — Emergency Department (HOSPITAL_COMMUNITY)
Admission: EM | Admit: 2018-07-27 | Discharge: 2018-07-28 | Disposition: A | Discharge status: Home / Self Care | Payer: Medicaid Other | Source: Home / Self Care | Attending: Emergency Medicine | Admitting: Emergency Medicine

## 2018-07-27 ENCOUNTER — Encounter (HOSPITAL_COMMUNITY): Payer: Self-pay | Admitting: Emergency Medicine

## 2018-07-27 ENCOUNTER — Emergency Department (HOSPITAL_COMMUNITY)
Admission: EM | Admit: 2018-07-27 | Discharge: 2018-07-27 | Disposition: A | Discharge status: Home / Self Care | Payer: Medicaid Other | Attending: Emergency Medicine | Admitting: Emergency Medicine

## 2018-07-27 ENCOUNTER — Other Ambulatory Visit: Payer: Self-pay

## 2018-07-27 DIAGNOSIS — F17210 Nicotine dependence, cigarettes, uncomplicated: Secondary | ICD-10-CM

## 2018-07-27 DIAGNOSIS — I509 Heart failure, unspecified: Secondary | ICD-10-CM | POA: Insufficient documentation

## 2018-07-27 DIAGNOSIS — B070 Plantar wart: Secondary | ICD-10-CM | POA: Insufficient documentation

## 2018-07-27 DIAGNOSIS — N4889 Other specified disorders of penis: Secondary | ICD-10-CM

## 2018-07-27 DIAGNOSIS — N4829 Other inflammatory disorders of penis: Secondary | ICD-10-CM | POA: Diagnosis not present

## 2018-07-27 DIAGNOSIS — E119 Type 2 diabetes mellitus without complications: Secondary | ICD-10-CM

## 2018-07-27 DIAGNOSIS — I110 Hypertensive heart disease with heart failure: Secondary | ICD-10-CM | POA: Diagnosis not present

## 2018-07-27 LAB — URINALYSIS, ROUTINE W REFLEX MICROSCOPIC
BILIRUBIN URINE: NEGATIVE
Bacteria, UA: NONE SEEN
Glucose, UA: NEGATIVE mg/dL
Ketones, ur: NEGATIVE mg/dL
Leukocytes, UA: NEGATIVE
NITRITE: NEGATIVE
Protein, ur: NEGATIVE mg/dL
Specific Gravity, Urine: 1.008 (ref 1.005–1.030)
pH: 6 (ref 5.0–8.0)

## 2018-07-27 MED ORDER — ACETAMINOPHEN 500 MG PO TABS
1000.0000 mg | ORAL_TABLET | Freq: Once | ORAL | Status: AC
  Administered 2018-07-27: 1000 mg via ORAL
  Filled 2018-07-27: qty 2

## 2018-07-27 NOTE — ED Notes (Signed)
Pt called x2 for vitals, no response. 

## 2018-07-27 NOTE — ED Provider Notes (Signed)
TIME SEEN: 5:41 AM  CHIEF COMPLAINT: "My penis is swollen"  HPI: Patient is a 58 year old male with history of substance abuse, homelessness, schizophrenia who presents to the emergency department with complaints that his penis is swollen.  He denies dysuria or hematuria.  No injury.  No testicular pain or swelling.  No fevers.  No vomiting or diarrhea.  No abdominal pain.  ROS: See HPI Constitutional: no fever  Eyes: no drainage  ENT: no runny nose   Cardiovascular:  no chest pain  Resp: no SOB  GI: no vomiting GU: no dysuria Integumentary: no rash  Allergy: no hives  Musculoskeletal: no leg swelling  Neurological: no slurred speech ROS otherwise negative  PAST MEDICAL HISTORY/PAST SURGICAL HISTORY:  Past Medical History:  Diagnosis Date  . CHF (congestive heart failure) (HCC)   . Chronic foot pain   . Cocaine abuse (HCC)   . Diabetes mellitus without complication (HCC)   . Hepatitis C   . Homelessness   . Hypertension   . Neuropathy   . Polysubstance abuse (HCC)   . Schizophrenia (HCC)     MEDICATIONS:  Prior to Admission medications   Medication Sig Start Date End Date Taking? Authorizing Provider  acetaminophen (TYLENOL) 500 MG tablet Take 1 tablet (500 mg total) by mouth every 6 (six) hours as needed. Patient not taking: Reported on 07/25/2018 07/24/18   Emi HolesLaw, Alexandra M, PA-C  amLODipine (NORVASC) 10 MG tablet Take 1 tablet (10 mg total) by mouth daily. Patient not taking: Reported on 07/23/2018 07/22/18   Elgergawy, Leana Roeawood S, MD  aspirin EC 81 MG tablet Take 1 tablet (81 mg total) by mouth daily. Patient not taking: Reported on 07/15/2018 03/30/18 03/30/19  Dorrell, Cathleen Cortieborah N, MD  atorvastatin (LIPITOR) 40 MG tablet Take 1 tablet (40 mg total) by mouth daily. Patient not taking: Reported on 07/15/2018 03/30/18   Dorrell, Cathleen Cortieborah N, MD  furosemide (LASIX) 20 MG tablet Take 40 mg oral daily, and 20 mg every evening Patient not taking: Reported on 07/23/2018 07/21/18    Elgergawy, Leana Roeawood S, MD  ibuprofen (ADVIL,MOTRIN) 400 MG tablet Take 1 tablet (400 mg total) by mouth every 6 (six) hours as needed. 07/24/18   Law, Waylan BogaAlexandra M, PA-C  Insulin Glargine (LANTUS) 100 UNIT/ML Solostar Pen Inject 20 Units into the skin daily. Patient not taking: Reported on 07/23/2018 07/21/18   Elgergawy, Leana Roeawood S, MD  Insulin Pen Needle 29G X 8MM MISC Use with insulin 07/21/18   Elgergawy, Leana Roeawood S, MD  lisinopril (PRINIVIL,ZESTRIL) 5 MG tablet Take 1 tablet (5 mg total) by mouth daily. Patient not taking: Reported on 07/15/2018 03/31/18   Dorrell, Cathleen Cortieborah N, MD    ALLERGIES:  Allergies  Allergen Reactions  . Haldol [Haloperidol] Other (See Comments)    Muscle spasms, loss of voluntary movement. However, pt has taken Thorazine on multiple occasions with no adverse effects.     SOCIAL HISTORY:  Social History   Tobacco Use  . Smoking status: Current Every Day Smoker    Packs/day: 1.00    Years: 20.00    Pack years: 20.00    Types: Cigarettes  . Smokeless tobacco: Current User  Substance Use Topics  . Alcohol use: Yes    Comment: Daily Drinker     FAMILY HISTORY: Family History  Problem Relation Age of Onset  . Hypertension Other   . Diabetes Other     EXAM: BP (!) 167/113 (BP Location: Right Arm)   Pulse 99   Temp 97.9 F (  36.6 C) (Oral)   Resp 19   Ht 5\' 9"  (1.753 m)   Wt 77.1 kg   SpO2 93%   BMI 25.10 kg/m  CONSTITUTIONAL: Alert and oriented and responds appropriately to questions. Well-appearing; well-nourished HEAD: Normocephalic EYES: Conjunctivae clear, pupils appear equal, EOMI ENT: normal nose; moist mucous membranes NECK: Supple, no meningismus, no nuchal rigidity, no LAD  CARD: RRR; S1 and S2 appreciated; no murmurs, no clicks, no rubs, no gallops RESP: Normal chest excursion without splinting or tachypnea; breath sounds clear and equal bilaterally; no wheezes, no rhonchi, no rales, no hypoxia or respiratory distress, speaking full  sentences ABD/GI: Normal bowel sounds; non-distended; soft, non-tender, no rebound, no guarding, no peritoneal signs, no hepatosplenomegaly GU:  Normal external genitalia, circumcised male, normal penile shaft, no blood or discharge at the urethral meatus, no testicular masses or tenderness on exam, no scrotal masses or swelling, no hernias appreciated, 2+ femoral pulses bilaterally; no perineal erythema, warmth, subcutaneous air or crepitus; no high riding testicle, normal bilateral cremasteric reflex.  Chaperone present for exam. BACK:  The back appears normal and is non-tender to palpation, there is no CVA tenderness EXT: Normal ROM in all joints; non-tender to palpation; no edema; normal capillary refill; no cyanosis, no calf tenderness or swelling    SKIN: Normal color for age and race; warm; no rash NEURO: Moves all extremities equally PSYCH: The patient's mood and manner are appropriate. Grooming and personal hygiene are appropriate.  MEDICAL DECISION MAKING: Patient here with complaints that his penis is swollen.  It appears completely normal on examination.  There is no sign of balanitis, urethritis.  He does not have any symptoms of UTI.  No testicular pain or swelling on exam to suggest testicular torsion, mass, epididymitis, orchitis, scrotal abscess, anasarca, Fournier's gangrene.  Urine shows small amount of blood but no other sign of infection.  No bacteria.  Gonorrhea and Chlamydia cultures are pending.  I do not think he needs any further treatment from the ED and I feel he is safe to be discharged.  He is eating and drinking without difficulty.  He is noted to be hypertensive here but this is baseline for patient.  At this time, I do not feel there is any life-threatening condition present. I have reviewed and discussed all results (EKG, imaging, lab, urine as appropriate) and exam findings with patient/family. I have reviewed nursing notes and appropriate previous records.  I feel the  patient is safe to be discharged home without further emergent workup and can continue workup as an outpatient as needed. Discussed usual and customary return precautions. Patient/family verbalize understanding and are comfortable with this plan.  Outpatient follow-up has been provided as needed. All questions have been answered.      Strummer Canipe, Layla Maw, DO 07/27/18 [phone]

## 2018-07-27 NOTE — ED Triage Notes (Addendum)
Reports penile swelling that started today.  Denies swelling to testicles.  States he believes he was bit by an insect.  Denies discharge but states penis is "sticky".  Pt hypertensive.

## 2018-07-27 NOTE — Discharge Instructions (Signed)
You were seen in the emergency department for swelling to your penis which has resolved.  Urine shows no sign of infection.  You are safe to be discharged home.  Steps to find a Primary Care Provider (PCP):  Call [phone] or [phone] to access "[place] Find a Doctor Service."  2.  You may also go on the [facility] website at [email or web address]  3.  [facility] also frequently accepts new patients.  [facility]  [address] [place] [address] [phone]  4.  There are also multiple [facility], [facility] and [facility] practices throughout [place] that are frequently accepting new patients. You may find a clinic that is close to your home and contact them.  [facility] [email or web address] [phone]  [email or web address]  [facility] [email or web address] [phone]  [email or web address] [phone]  5.  Local Health Departments also can provide primary care services.  [facility]  [address] [place] [address] [phone]  [facility] Department [address] [place] [address] [phone]  [facility] [address] [place] [address]  [place] [address] [phone]

## 2018-07-27 NOTE — ED Triage Notes (Signed)
Pt. Requested to take a shower  And I have do do in my pants.

## 2018-07-27 NOTE — ED Triage Notes (Signed)
Pt. Stated, [name] had a cold for 2 weeks cause Im homeless and Im out in the cold.

## 2018-07-28 ENCOUNTER — Emergency Department (HOSPITAL_COMMUNITY)
Admission: EM | Admit: 2018-07-28 | Discharge: 2018-07-29 | Disposition: A | Discharge status: Home / Self Care | Payer: Medicaid Other | Attending: Emergency Medicine | Admitting: Emergency Medicine

## 2018-07-28 DIAGNOSIS — R05 Cough: Secondary | ICD-10-CM | POA: Insufficient documentation

## 2018-07-28 DIAGNOSIS — R45851 Suicidal ideations: Secondary | ICD-10-CM | POA: Diagnosis not present

## 2018-07-28 DIAGNOSIS — E119 Type 2 diabetes mellitus without complications: Secondary | ICD-10-CM | POA: Insufficient documentation

## 2018-07-28 DIAGNOSIS — R0602 Shortness of breath: Secondary | ICD-10-CM | POA: Insufficient documentation

## 2018-07-28 DIAGNOSIS — I110 Hypertensive heart disease with heart failure: Secondary | ICD-10-CM | POA: Diagnosis not present

## 2018-07-28 DIAGNOSIS — R600 Localized edema: Secondary | ICD-10-CM | POA: Insufficient documentation

## 2018-07-28 DIAGNOSIS — Z794 Long term (current) use of insulin: Secondary | ICD-10-CM | POA: Insufficient documentation

## 2018-07-28 DIAGNOSIS — F17210 Nicotine dependence, cigarettes, uncomplicated: Secondary | ICD-10-CM | POA: Insufficient documentation

## 2018-07-28 DIAGNOSIS — I509 Heart failure, unspecified: Secondary | ICD-10-CM | POA: Insufficient documentation

## 2018-07-28 DIAGNOSIS — R0789 Other chest pain: Secondary | ICD-10-CM | POA: Diagnosis not present

## 2018-07-28 DIAGNOSIS — Z590 Homelessness: Secondary | ICD-10-CM | POA: Diagnosis not present

## 2018-07-28 DIAGNOSIS — R079 Chest pain, unspecified: Secondary | ICD-10-CM | POA: Diagnosis present

## 2018-07-28 DIAGNOSIS — Z7982 Long term (current) use of aspirin: Secondary | ICD-10-CM | POA: Diagnosis not present

## 2018-07-28 DIAGNOSIS — Z79899 Other long term (current) drug therapy: Secondary | ICD-10-CM | POA: Diagnosis not present

## 2018-07-28 MED ORDER — ACETAMINOPHEN 500 MG PO TABS
1000.0000 mg | ORAL_TABLET | Freq: Once | ORAL | Status: AC
  Administered 2018-07-28: 1000 mg via ORAL
  Filled 2018-07-28: qty 2

## 2018-07-28 MED ORDER — ACETAMINOPHEN 500 MG PO TABS: 500.0000 mg | ORAL_TABLET | Freq: Four times a day (QID) | ORAL | 0 refills | Status: DC | PRN

## 2018-07-28 NOTE — ED Triage Notes (Signed)
Patient states that he needs to speak to someone about everything. Wants to speak to someone about his "feet hurting, abdominal pain, mental issues, back pain and I'm homeless". Patient states that he has been drinking and using crack cocaine today.

## 2018-07-28 NOTE — ED Notes (Signed)
Patient provided with meal and orange juice

## 2018-07-28 NOTE — ED Provider Notes (Signed)
[facility] EMERGENCY DEPARTMENT Provider Note   CSN: [id number] Arrival date & time: 07/27/18  1750    History   Chief Complaint Chief Complaint  Patient presents with  . Nasal Congestion  . Rectal Pain    HPI [name] is a 58 y.o. male.   58 year old male well-known to the emergency department presents today with no clear complaint.  He reports in triage that he may have a cold, but does not disclose any cold symptoms during my encounter with him.  He is requesting a prescription for Tylenol.  States that his foot hurts and asks me to look at it.  Was given a shower prior to my evaluation after soiling his pants.  He states that he wants to talk with a social worker about placement in a group home.  Was seen yesterday for complaints of penile discomfort.  The history is provided by the patient. No language interpreter was used.    Past Medical History:  Diagnosis Date  . CHF (congestive heart failure) (HCC)   . Chronic foot pain   . Cocaine abuse (HCC)   . Diabetes mellitus without complication (HCC)   . Hepatitis C   . Homelessness   . Hypertension   . Neuropathy   . Polysubstance abuse (HCC)   . Schizophrenia [facility])     Patient Active Problem List   Diagnosis Date Noted  . CHF exacerbation (HCC) 07/18/2018  . CHF (congestive heart failure) (HCC) 07/15/2018  . Acute exacerbation of CHF (congestive heart failure) (HCC) 03/28/2018  . Acute on chronic heart failure (HCC) 03/16/2018  . Prostate enlargement 03/16/2018  . Aortic atherosclerosis (HCC) 03/16/2018  . 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 Cocaine abuse with cocaine-induced mood disorder (HCC) 09/18/2017  . Chronic foot pain   . Schizoaffective disorder, bipolar type (HCC) 09/30/2016  . Suicidal thoughts   . Substance induced mood disorder (HCC) 03/13/2015  . Acute kidney failure (HCC) 01/26/2015  . Schizophrenia, paranoid type (HCC) 01/17/2015  . Suicidal ideation    . Drug hallucinosis (HCC) 10/08/2014  . Chronic paranoid schizophrenia (HCC) 09/07/2014  . 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 Acute CHF (congestive heart failure) (HCC) 07/29/2014  . Urinary retention   . Cocaine use disorder, severe, dependence (HCC)   . Cocaine abuse (HCC) 03/10/2014  . Essential hypertension, benign 03/28/2013  . Diabetes mellitus (HCC) 03/15/2013    Past Surgical History:  Procedure Laterality Date  . MULTIPLE TOOTH EXTRACTIONS          Home Medications    Prior to Admission medications   Medication Sig Start Date End Date Taking? Authorizing Provider  acetaminophen (TYLENOL) 500 MG tablet Take 1 tablet (500 mg total) by mouth every 6 (six) hours as needed. 07/28/18   [name], PA-C  amLODipine (NORVASC) 10 MG tablet Take 1 tablet (10 mg total) by mouth daily. Patient not taking: Reported on 07/23/2018 07/22/18   Elgergawy, Leana Roe, MD  aspirin EC 81 MG tablet Take 1 tablet (81 mg total) by mouth daily. Patient not taking: Reported on 07/15/2018 03/30/18 03/30/19  Dorrell, Cathleen Corti, MD  atorvastatin (LIPITOR) 40 MG tablet Take 1 tablet (40 mg total) by mouth daily. Patient not taking: Reported on 07/15/2018 03/30/18   Dorrell, Cathleen Corti, MD  furosemide (LASIX) 20 MG tablet Take 40 mg oral daily, and 20 mg every evening Patient not taking: Reported on 07/23/2018 07/21/18  Elgergawy, Leana Roe, MD  ibuprofen (ADVIL,MOTRIN) 400 MG tablet Take 1 tablet (400 mg total) by mouth every 6 (six) hours as needed. 07/24/18   Law, Waylan Boga, PA-C  Insulin Glargine (LANTUS) 100 UNIT/ML Solostar Pen Inject 20 Units into the skin daily. Patient not taking: Reported on 07/23/2018 07/21/18   Elgergawy, Leana Roe, MD  Insulin Pen Needle 29G X MISC Use with insulin 07/21/18   Elgergawy, Leana Roe, MD  lisinopril (PRINIVIL,ZESTRIL) 5 MG tablet Take 1 tablet (5 mg total) by mouth daily. Patient not taking:  Reported on 07/15/2018 03/31/18   Dorrell, Cathleen Corti, MD    Family History Family History  Problem Relation Age of Onset  . Hypertension Other   . Diabetes Other     Social History Social History   Tobacco Use  . Smoking status: Current Every Day Smoker    Packs/day: 1.00    Years: 20.00    Pack years: 20.00    Types: Cigarettes  . Smokeless tobacco: Current User  Substance Use Topics  . Alcohol use: Yes    Comment: Daily Drinker   . Drug use: Yes    Frequency: 7.0 times per week    Types: "Crack" cocaine, Cocaine, Marijuana    Comment: Cocaine tonight, Marijuana "a long time"     Allergies   Haldol [haloperidol]   Review of Systems Review of Systems Ten systems reviewed and are negative for acute change, except as noted in the HPI.    Physical Exam Updated Vital Signs BP (!) 167/99 (BP Location: Left Arm)   Pulse (!) 109   Temp 98.1 F (36.7 C) (Oral)   Resp 18   SpO2 93%   Physical Exam Vitals signs and nursing note reviewed.  Constitutional:      General: He is not in acute distress.    Appearance: He is well-developed. He is not diaphoretic.     Comments: Nontoxic appearing. Eating a sandwich and drinking sprite.  HENT:     Head: Normocephalic and atraumatic.  Eyes:     General: No scleral icterus.    Conjunctiva/sclera: Conjunctivae normal.  Neck:     Musculoskeletal: Normal range of motion.  Pulmonary:     Effort: Pulmonary effort is normal. No respiratory distress.     Comments: Respirations even and unlabored Musculoskeletal: Normal range of motion.  Skin:    General: Skin is warm and dry.     Coloration: Skin is not pale.     Findings: No erythema or rash.     Comments: Plantar wart to ball of foot at base of R great toe.  Neurological:     Mental Status: He is alert and oriented to person, place, and time.  Psychiatric:        Behavior: Behavior normal.      ED Treatments / Results  Labs (all labs ordered are listed, but only  abnormal results are displayed) Labs Reviewed - No data to display  EKG None  Radiology No results found.  Procedures Procedures (including critical care time)  Medications Ordered in ED Medications  acetaminophen (TYLENOL) tablet 1,000 mg (has no administration in time range)     Initial Impression / Assessment and Plan / ED Course  I have reviewed the triage vital signs and the nursing notes.  Pertinent labs & imaging results that were available during my care of the patient were reviewed by me and considered in my medical decision making (see chart for details).  58 year old male well-known to the ED presents today for multiple complaints.  He was given a shower and asked that I examine his foot which shows a plantar wart without signs of secondary infection.  He requests a prescription for Tylenol which was provided.  Is requesting to speak with social work in the morning, though I feel he is appropriate for discharge.  He has been given a Facilities managerresource guide for outpatient residential behavioral health facilities.  States that he would like to try and be placed in a group home.  I have encouraged him to follow-up with his primary care doctor for this.  Patient discharged in stable condition.   Final Clinical Impressions(s) / ED Diagnoses   Final diagnoses:  Plantar wart    ED Discharge Orders         Ordered    acetaminophen (TYLENOL) 500 MG tablet  Every 6 hours PRN     07/28/18 0456           Antony MaduraHumes, Gilbert Manolis, PA-C 07/28/18 0515    Ward, Layla MawKristen N, DO 07/28/18 [phone]

## 2018-07-28 NOTE — ED Notes (Signed)
Pt assisted to shower by tech.

## 2018-07-28 NOTE — ED Notes (Signed)
Patient verbalizes understanding of discharge instructions. Opportunity for questioning and answers were provided. Armband removed by staff, pt discharged from ED ambulatory.   

## 2018-07-29 ENCOUNTER — Encounter (HOSPITAL_COMMUNITY): Payer: Self-pay | Admitting: Emergency Medicine

## 2018-07-29 ENCOUNTER — Other Ambulatory Visit: Payer: Self-pay

## 2018-07-29 ENCOUNTER — Emergency Department (HOSPITAL_COMMUNITY): Payer: Medicaid Other

## 2018-07-29 ENCOUNTER — Inpatient Hospital Stay (HOSPITAL_COMMUNITY)
Admission: EM | Admit: 2018-07-29 | Discharge: 2018-08-01 | DRG: 292 | Discharge status: Against Medical Advice | Payer: Medicare Other | Attending: Internal Medicine | Admitting: Internal Medicine

## 2018-07-29 DIAGNOSIS — I509 Heart failure, unspecified: Secondary | ICD-10-CM

## 2018-07-29 DIAGNOSIS — Z794 Long term (current) use of insulin: Secondary | ICD-10-CM

## 2018-07-29 DIAGNOSIS — I5023 Acute on chronic systolic (congestive) heart failure: Secondary | ICD-10-CM | POA: Diagnosis present

## 2018-07-29 DIAGNOSIS — Z9119 Patient's noncompliance with other medical treatment and regimen: Secondary | ICD-10-CM

## 2018-07-29 DIAGNOSIS — Z833 Family history of diabetes mellitus: Secondary | ICD-10-CM

## 2018-07-29 DIAGNOSIS — I10 Essential (primary) hypertension: Secondary | ICD-10-CM | POA: Diagnosis present

## 2018-07-29 DIAGNOSIS — I110 Hypertensive heart disease with heart failure: Principal | ICD-10-CM | POA: Diagnosis present

## 2018-07-29 DIAGNOSIS — D649 Anemia, unspecified: Secondary | ICD-10-CM | POA: Diagnosis present

## 2018-07-29 DIAGNOSIS — Z7982 Long term (current) use of aspirin: Secondary | ICD-10-CM

## 2018-07-29 DIAGNOSIS — F200 Paranoid schizophrenia: Secondary | ICD-10-CM | POA: Diagnosis present

## 2018-07-29 DIAGNOSIS — E1142 Type 2 diabetes mellitus with diabetic polyneuropathy: Secondary | ICD-10-CM | POA: Diagnosis present

## 2018-07-29 DIAGNOSIS — Z590 Homelessness: Secondary | ICD-10-CM

## 2018-07-29 DIAGNOSIS — F1420 Cocaine dependence, uncomplicated: Secondary | ICD-10-CM | POA: Diagnosis present

## 2018-07-29 DIAGNOSIS — F17210 Nicotine dependence, cigarettes, uncomplicated: Secondary | ICD-10-CM | POA: Diagnosis present

## 2018-07-29 DIAGNOSIS — E1165 Type 2 diabetes mellitus with hyperglycemia: Secondary | ICD-10-CM | POA: Diagnosis present

## 2018-07-29 DIAGNOSIS — Z79899 Other long term (current) drug therapy: Secondary | ICD-10-CM

## 2018-07-29 DIAGNOSIS — Z9114 Patient's other noncompliance with medication regimen: Secondary | ICD-10-CM

## 2018-07-29 DIAGNOSIS — Z8249 Family history of ischemic heart disease and other diseases of the circulatory system: Secondary | ICD-10-CM

## 2018-07-29 HISTORY — DX: Sleep apnea, unspecified: G47.30

## 2018-07-29 LAB — COMPREHENSIVE METABOLIC PANEL
ALT: 41 U/L (ref 0–44)
AST: 53 U/L — ABNORMAL HIGH (ref 15–41)
Albumin: 2.9 g/dL — ABNORMAL LOW (ref 3.5–5.0)
Alkaline Phosphatase: 92 U/L (ref 38–126)
Anion gap: 8 (ref 5–15)
BUN: 23 mg/dL — ABNORMAL HIGH (ref 6–20)
CO2: 25 mmol/L (ref 22–32)
Calcium: 8.9 mg/dL (ref 8.9–10.3)
Chloride: 109 mmol/L (ref 98–111)
Creatinine, Ser: 0.91 mg/dL (ref 0.61–1.24)
GFR calc Af Amer: >60 mL/min (ref >60)
Glucose, Bld: 137 mg/dL — ABNORMAL HIGH (ref 70–99)
Potassium: 3.4 mmol/L — ABNORMAL LOW (ref 3.5–5.1)
Sodium: 142 mmol/L (ref 135–145)
Total Bilirubin: 0.9 mg/dL (ref 0.3–1.2)
Total Protein: 6.5 g/dL (ref 6.5–8.1)

## 2018-07-29 LAB — RAPID URINE DRUG SCREEN, HOSP PERFORMED
Amphetamines: NOT DETECTED
BARBITURATES: NOT DETECTED
Benzodiazepines: NOT DETECTED
COCAINE: POSITIVE — AB
Opiates: NOT DETECTED
Tetrahydrocannabinol: NOT DETECTED

## 2018-07-29 LAB — URINALYSIS, ROUTINE W REFLEX MICROSCOPIC
Bacteria, UA: NONE SEEN
Bilirubin Urine: NEGATIVE
Glucose, UA: 50 mg/dL — AB
Ketones, ur: NEGATIVE mg/dL
Leukocytes, UA: NEGATIVE
Nitrite: NEGATIVE
Protein, ur: 100 mg/dL — AB
Specific Gravity, Urine: 1.017 (ref 1.005–1.030)
pH: 6 (ref 5.0–8.0)

## 2018-07-29 LAB — CBC
HCT: 37.2 % — ABNORMAL LOW (ref 39.0–52.0)
Hemoglobin: 10.9 g/dL — ABNORMAL LOW (ref 13.0–17.0)
MCH: 26.2 pg (ref 26.0–34.0)
MCHC: 29.3 g/dL — ABNORMAL LOW (ref 30.0–36.0)
MCV: 89.4 fL (ref 80.0–100.0)
Platelets: 228 10*3/uL (ref 150–400)
RBC: 4.16 MIL/uL — ABNORMAL LOW (ref 4.22–5.81)
RDW: 17.8 % — AB (ref 11.5–15.5)
WBC: 9.5 10*3/uL (ref 4.0–10.5)
nRBC: 0 % (ref 0.0–0.2)

## 2018-07-29 LAB — I-STAT TROPONIN, ED: Troponin i, poc: 0.04 ng/mL (ref 0.00–0.08)

## 2018-07-29 LAB — ETHANOL: Alcohol, Ethyl (B): <10 mg/dL (ref <10)

## 2018-07-29 MED ORDER — FUROSEMIDE 20 MG PO TABS
40.0000 mg | ORAL_TABLET | Freq: Once | ORAL | Status: AC
  Administered 2018-07-29: 40 mg via ORAL
  Filled 2018-07-29: qty 2

## 2018-07-29 MED ORDER — POTASSIUM CHLORIDE CRYS ER 20 MEQ PO TBCR
40.0000 meq | EXTENDED_RELEASE_TABLET | Freq: Once | ORAL | Status: AC
  Administered 2018-07-29: 40 meq via ORAL
  Filled 2018-07-29: qty 2

## 2018-07-29 MED ORDER — ACETAMINOPHEN 325 MG PO TABS
650.0000 mg | ORAL_TABLET | Freq: Once | ORAL | Status: AC
  Administered 2018-07-29: 650 mg via ORAL
  Filled 2018-07-29: qty 2

## 2018-07-29 NOTE — ED Notes (Signed)
While medicating patient, [place] sandwich was also provided. Patient became upset with the presence of the sandwich, this RN offered to take back the [place] sandwich if he did not want it, patient declined and told this RN to leave him alone because he got his meds and that was all this RN was required to do. Patient became angry and violent, and snatched up his [place] sandwich and threw it at the ground, yelling "man i'm a veteran and a diabetic and yall bringing me this shit! All these white women can die!". Patient refusing to allow staff to check his blood sugar. Security notified, patient wanded. Will continue to monitor.

## 2018-07-29 NOTE — ED Triage Notes (Addendum)
C/o lower back pain and feeling dehydrated.  States he is homeless and doesn't have money to buy anything to drink.  Also reports he felt SOB earlier today when he got upset about being homeless.  Denies SOB at present.  NAD.  Pt asking for warm blanket on arrival and went to sleep in waiting room shortly after arrival.

## 2018-07-29 NOTE — ED Notes (Signed)
Patient picked his sandwich off the floor and is now eating it calmly. Patient provided with orange juice and milk as well.

## 2018-07-29 NOTE — ED Provider Notes (Signed)
[facility] Provider Note   CSN: [id number] Arrival date & time: 07/28/18  2247     History   Chief Complaint Chief Complaint  Patient presents with  . Multiple complaints    HPI [name] is a 58 y.o. male who presents with multiple complaints.  Past medical history significant for frequent ED use, homelessness, polysubstance abuse, schizophrenia, CHF, insulin-dependent diabetes.  Patient initially states that he is suicidal.  He plans to step in front of a bus.  Patient also states that he is having chest pain, shortness of breath, cough.  He reports that his abdomen is swollen his legs have increased swelling.  He also has had swelling of his penis and testicles but this is improved.  He is been taking his medicines.  Patient denies of fever, URI symptoms.  He was recently treated for strep throat.  He is asking for Tylenol for his foot pain and his throat pain.  HPI  Past Medical History:  Diagnosis Date  . CHF (congestive heart failure) (HCC)   . Chronic foot pain   . Cocaine abuse (HCC)   . Diabetes mellitus without complication (HCC)   . Hepatitis C   . Homelessness   . Hypertension   . Neuropathy   . Polysubstance abuse (HCC)   . Schizophrenia [facility](HCC)     Patient Active Problem List   Diagnosis Date Noted  . CHF (congestive heart failure) (HCC) 07/15/2018  . Acute exacerbation of CHF (congestive heart failure) (HCC) 03/28/2018  . Prostate enlargement 03/16/2018  . Aortic atherosclerosis (HCC) 03/16/2018  . 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 Chronic foot pain   . Schizoaffective disorder, bipolar type (HCC) 09/30/2016  . Substance induced mood disorder (HCC) 03/13/2015  . Acute kidney failure (HCC) 01/26/2015  . Schizophrenia, paranoid type (HCC) 01/17/2015  . Suicidal ideation   . Drug hallucinosis (HCC) 10/08/2014  . Chronic paranoid schizophrenia (HCC) 09/07/2014  . Substance or medication-induced  bipolar and related disorder with onset during intoxication (HCC) 08/10/2014  . Urinary retention   . Cocaine use disorder, severe, dependence (HCC)   . Essential hypertension, benign 03/28/2013  . Diabetes mellitus (HCC) 03/15/2013    Past Surgical History:  Procedure Laterality Date  . MULTIPLE TOOTH EXTRACTIONS          Home Medications    Prior to Admission medications   Medication Sig Start Date End Date Taking? Authorizing Provider  acetaminophen (TYLENOL) 500 MG tablet Take 1 tablet (500 mg total) by mouth every 6 (six) hours as needed. 07/28/18   Antony MaduraHumes, Holiday Mcmenamin, PA-C  amLODipine (NORVASC) 10 MG tablet Take 1 tablet (10 mg total) by mouth daily. Patient not taking: Reported on 07/23/2018 07/22/18   Elgergawy, Leana Roeawood S, MD  aspirin EC 81 MG tablet Take 1 tablet (81 mg total) by mouth daily. Patient not taking: Reported on 07/15/2018 03/30/18 03/30/19  Dorrell, Cathleen Cortieborah N, MD  atorvastatin (LIPITOR) 40 MG tablet Take 1 tablet (40 mg total) by mouth daily. Patient not taking: Reported on 07/15/2018 03/30/18   Dorrell, Cathleen Cortieborah N, MD  furosemide (LASIX) 20 MG tablet Take 40 mg oral daily, and 20 mg every evening Patient not taking: Reported on 07/23/2018 07/21/18   Elgergawy, Leana Roeawood S, MD  ibuprofen (ADVIL,MOTRIN) 400 MG tablet Take 1 tablet (400 mg total) by mouth every 6 (six) hours as needed. 07/24/18   Law, Waylan BogaAlexandra M, PA-C  Insulin Glargine (LANTUS) 100 UNIT/ML Solostar Pen Inject 20 Units into  the skin daily. Patient not taking: Reported on 07/23/2018 07/21/18   Elgergawy, Leana Roeawood S, MD  Insulin Pen Needle 29G X 8MM MISC Use with insulin 07/21/18   Elgergawy, Leana Roeawood S, MD  lisinopril (PRINIVIL,ZESTRIL) 5 MG tablet Take 1 tablet (5 mg total) by mouth daily. Patient not taking: Reported on 07/15/2018 03/31/18   Dorrell, Cathleen Cortieborah N, MD    Family History Family History  Problem Relation Age of Onset  . Hypertension Other   . Diabetes Other     Social History Social History   Tobacco Use   . Smoking status: Current Every Day Smoker    Packs/day: 1.00    Years: 20.00    Pack years: 20.00    Types: Cigarettes  . Smokeless tobacco: Current User  Substance Use Topics  . Alcohol use: Yes    Comment: Daily Drinker   . Drug use: Yes    Frequency: 7.0 times per week    Types: "Crack" cocaine, Cocaine, Marijuana    Comment: Cocaine tonight, Marijuana "a long time"     Allergies   Haldol [haloperidol]   Review of Systems Review of Systems  Constitutional: Negative for fever.  HENT: Positive for sore throat.   Respiratory: Positive for cough and shortness of breath.   Cardiovascular: Positive for chest pain.  Gastrointestinal: Positive for abdominal distention and constipation. Negative for abdominal pain, diarrhea, nausea and vomiting.  Genitourinary: Negative for difficulty urinating.  All other systems reviewed and are negative.    Physical Exam Updated Vital Signs BP (!) 162/110 (BP Location: Right Arm)   Pulse (!) 109   Temp 98.1 F (36.7 C) (Oral)   Resp 19   SpO2 92%   Physical Exam Vitals signs and nursing note reviewed.  Constitutional:      General: He is not in acute distress.    Appearance: Normal appearance. He is well-developed.     Comments: Calm and cooperative.  Somewhat animated  HENT:     Head: Normocephalic and atraumatic.  Eyes:     General: No scleral icterus.       Right eye: No discharge.        Left eye: No discharge.     Conjunctiva/sclera: Conjunctivae normal.     Pupils: Pupils are equal, round, and reactive to light.  Neck:     Musculoskeletal: Normal range of motion.  Cardiovascular:     Rate and Rhythm: Normal rate and regular rhythm.  Pulmonary:     Effort: Pulmonary effort is normal. No respiratory distress.     Breath sounds: Normal breath sounds.  Abdominal:     General: There is no distension.     Palpations: Abdomen is soft.     Tenderness: There is no abdominal tenderness.  Genitourinary:    Comments: No  swelling of the penis or testicles Musculoskeletal:     Right lower leg: Edema present.     Left lower leg: Edema present.  Skin:    General: Skin is warm and dry.  Neurological:     Mental Status: He is alert and oriented to person, place, and time.  Psychiatric:        Behavior: Behavior normal.      ED Treatments / Results  Labs (all labs ordered are listed, but only abnormal results are displayed) Labs Reviewed  COMPREHENSIVE METABOLIC PANEL - Abnormal; Notable for the following components:      Result Value   Potassium 3.4 (*)    Glucose, Bld 137 (*)  BUN 23 (*)    Albumin 2.9 (*)    AST 53 (*)    All other components within normal limits  URINALYSIS, ROUTINE W REFLEX MICROSCOPIC - Abnormal; Notable for the following components:   Glucose, UA 50 (*)    Hgb urine dipstick SMALL (*)    Protein, ur 100 (*)    All other components within normal limits  CBC - Abnormal; Notable for the following components:   RBC 4.16 (*)    Hemoglobin 10.9 (*)    HCT 37.2 (*)    MCHC 29.3 (*)    RDW 17.8 (*)    All other components within normal limits  ETHANOL  RAPID URINE DRUG SCREEN, HOSP PERFORMED  BRAIN NATRIURETIC PEPTIDE  I-STAT TROPONIN, ED    EKG None  Radiology Dg Chest 2 View  Result Date: 07/29/2018 CLINICAL DATA:  Chest pain and shortness of breath and cough. EXAM: CHEST - 2 VIEW COMPARISON:  07/24/2018 and 07/15/2018 FINDINGS: The patient has developed new mild cardiomegaly even considering the AP technique. There is new mild pulmonary vascular congestion. There is a new slight accentuation of the interstitial markings with new fluid along the fissures as well as increased atelectasis in the region of the lingula. No discrete effusions. No acute bone abnormality. Incidental note is made of a metal pellet in the soft tissues in the anterior aspect of the lower right chest, unchanged. Slight aortic atherosclerosis. IMPRESSION: Findings consistent with mild congestive  heart failure. Electronically Signed   By: Francene Boyers M.D.   On: 07/29/2018 08:18    Procedures Procedures (including critical care time)  Medications Ordered in ED Medications  acetaminophen (TYLENOL) tablet 650 mg (650 mg Oral Given 07/29/18 0851)  furosemide (LASIX) tablet 40 mg (40 mg Oral Given 07/29/18 0851)  potassium chloride SA (K-DUR,KLOR-CON) CR tablet 40 mEq (40 mEq Oral Given 07/29/18 0851)     Initial Impression / Assessment and Plan / ED Course  I have reviewed the triage vital signs and the nursing notes.  Pertinent labs & imaging results that were available during my care of the patient were reviewed by me and considered in my medical decision making (see chart for details).  58 year old male presents with multiple complaints.  He states that the primary 1 is he feels suicidal.  He also has chest pain, shortness of breath, leg swelling and abdominal swelling.  He does have a history of heart failure.  Is unclear if he is taking his medicines or not.  He has been waiting in the waiting room for about 8 hours prior to my evaluation.  He does endorse crack cocaine use prior to his arrival.  Blood work and triage reveals mild anemia which is stable.  He also has mild hypokalemia.  UA shows 50 glucose, small hemoglobin, 6-10 white blood cells.  He has no bacteria in the urine.  Will add chest x-ray and EKG due to his complaints of chest pain and shortness of breath.  Chest x-ray shows mild interstitial markings and pulmonary vascular congestion.  He was given a dose of Lasix, potassium, and Tylenol per his request.  Nurses inform me that the patient now wants to leave.  He is been belligerent with staff and stating that he does not want to be taking care of by white women.  Patient is a frequent ED utilizer.  At this time his labs and vital signs are stable.  He can return if needed.  Final Clinical Impressions(s) / ED  Diagnoses   Final diagnoses:  Suicidal ideation    Atypical chest pain  Leg edema    ED Discharge Orders    None       Bethel Born, PA-C 07/29/18 0912    Gerhard Munch, MD 07/29/18 [phone]

## 2018-07-29 NOTE — ED Notes (Signed)
Pt ambulatory to room from lobby without difficulty or need of assistance.  

## 2018-07-29 NOTE — ED Notes (Signed)
Patient verbalizes understanding of discharge instructions. Opportunity for questioning and answers were provided. Armband removed by staff, pt discharged from ED ambulatory after he finishes his [place] sandwich.

## 2018-07-30 ENCOUNTER — Encounter (HOSPITAL_COMMUNITY): Payer: Self-pay | Admitting: General Practice

## 2018-07-30 ENCOUNTER — Other Ambulatory Visit: Payer: Self-pay

## 2018-07-30 ENCOUNTER — Emergency Department (HOSPITAL_COMMUNITY): Payer: Medicare Other

## 2018-07-30 ENCOUNTER — Other Ambulatory Visit (HOSPITAL_COMMUNITY): Payer: Self-pay

## 2018-07-30 DIAGNOSIS — I509 Heart failure, unspecified: Secondary | ICD-10-CM | POA: Diagnosis not present

## 2018-07-30 DIAGNOSIS — I5023 Acute on chronic systolic (congestive) heart failure: Secondary | ICD-10-CM

## 2018-07-30 DIAGNOSIS — F200 Paranoid schizophrenia: Secondary | ICD-10-CM | POA: Diagnosis not present

## 2018-07-30 DIAGNOSIS — I10 Essential (primary) hypertension: Secondary | ICD-10-CM | POA: Diagnosis not present

## 2018-07-30 DIAGNOSIS — F1420 Cocaine dependence, uncomplicated: Secondary | ICD-10-CM

## 2018-07-30 LAB — GLUCOSE, CAPILLARY
Glucose-Capillary: 217 mg/dL — ABNORMAL HIGH (ref 70–99)
Glucose-Capillary: 145 mg/dL — ABNORMAL HIGH (ref 70–99)
Glucose-Capillary: 253 mg/dL — ABNORMAL HIGH (ref 70–99)
Glucose-Capillary: 214 mg/dL — ABNORMAL HIGH (ref 70–99)

## 2018-07-30 LAB — URINALYSIS, ROUTINE W REFLEX MICROSCOPIC
Bacteria, UA: NONE SEEN
Bilirubin Urine: NEGATIVE
Glucose, UA: 150 mg/dL — AB
HGB URINE DIPSTICK: NEGATIVE
Ketones, ur: NEGATIVE mg/dL
Leukocytes, UA: NEGATIVE
Nitrite: NEGATIVE
Protein, ur: 30 mg/dL — AB
Specific Gravity, Urine: 1.012 (ref 1.005–1.030)
pH: 6 (ref 5.0–8.0)

## 2018-07-30 LAB — RAPID URINE DRUG SCREEN, HOSP PERFORMED
Amphetamines: NOT DETECTED
Barbiturates: NOT DETECTED
Benzodiazepines: NOT DETECTED
COCAINE: POSITIVE — AB
Opiates: NOT DETECTED
Tetrahydrocannabinol: NOT DETECTED

## 2018-07-30 LAB — CBC WITH DIFFERENTIAL/PLATELET
Abs Immature Granulocytes: 0.02 10*3/uL (ref 0.00–0.07)
Basophils Absolute: 0 10*3/uL (ref 0.0–0.1)
Basophils Relative: 0 %
Eosinophils Absolute: 0 10*3/uL (ref 0.0–0.5)
Eosinophils Relative: 0 %
HCT: 33.7 % — ABNORMAL LOW (ref 39.0–52.0)
HEMOGLOBIN: 10.3 g/dL — AB (ref 13.0–17.0)
Immature Granulocytes: 0 %
Lymphocytes Relative: 8 %
Lymphs Abs: 0.6 10*3/uL — ABNORMAL LOW (ref 0.7–4.0)
MCH: 26.7 pg (ref 26.0–34.0)
MCHC: 30.6 g/dL (ref 30.0–36.0)
MCV: 87.3 fL (ref 80.0–100.0)
Monocytes Absolute: 0.3 10*3/uL (ref 0.1–1.0)
Monocytes Relative: 4 %
Neutro Abs: 6.3 10*3/uL (ref 1.7–7.7)
Neutrophils Relative %: 88 %
Platelets: 206 10*3/uL (ref 150–400)
RBC: 3.86 MIL/uL — AB (ref 4.22–5.81)
RDW: 17.8 % — ABNORMAL HIGH (ref 11.5–15.5)
WBC: 7.2 10*3/uL (ref 4.0–10.5)
nRBC: 0 % (ref 0.0–0.2)

## 2018-07-30 LAB — COMPREHENSIVE METABOLIC PANEL
ALT: 41 U/L (ref 0–44)
AST: 54 U/L — ABNORMAL HIGH (ref 15–41)
Albumin: 2.6 g/dL — ABNORMAL LOW (ref 3.5–5.0)
Alkaline Phosphatase: 79 U/L (ref 38–126)
Anion gap: 9 (ref 5–15)
BUN: 17 mg/dL (ref 6–20)
CO2: 22 mmol/L (ref 22–32)
Calcium: 7.9 mg/dL — ABNORMAL LOW (ref 8.9–10.3)
Chloride: 106 mmol/L (ref 98–111)
Creatinine, Ser: 0.88 mg/dL (ref 0.61–1.24)
GFR calc Af Amer: >60 mL/min (ref >60)
GFR calc non Af Amer: >60 mL/min (ref >60)
Glucose, Bld: 231 mg/dL — ABNORMAL HIGH (ref 70–99)
Potassium: 3.8 mmol/L (ref 3.5–5.1)
Sodium: 137 mmol/L (ref 135–145)
Total Bilirubin: 1 mg/dL (ref 0.3–1.2)
Total Protein: 5.7 g/dL — ABNORMAL LOW (ref 6.5–8.1)

## 2018-07-30 LAB — ETHANOL: Alcohol, Ethyl (B): <10 mg/dL (ref <10)

## 2018-07-30 LAB — TROPONIN I: Troponin I: 0.03 ng/mL (ref <0.03)

## 2018-07-30 LAB — INFLUENZA PANEL BY PCR (TYPE A & B)
Influenza A By PCR: NEGATIVE
Influenza B By PCR: NEGATIVE

## 2018-07-30 LAB — BRAIN NATRIURETIC PEPTIDE: B Natriuretic Peptide: 1089.7 pg/mL — ABNORMAL HIGH (ref 0.0–100.0)

## 2018-07-30 MED ORDER — SODIUM CHLORIDE 0.9% FLUSH
3.0000 mL | Freq: Two times a day (BID) | INTRAVENOUS | Status: DC
  Administered 2018-07-30 – 2018-08-01 (×4): 3 mL via INTRAVENOUS

## 2018-07-30 MED ORDER — INSULIN GLARGINE 100 UNIT/ML ~~LOC~~ SOLN
5.0000 [IU] | Freq: Every day | SUBCUTANEOUS | Status: DC
  Administered 2018-07-30: 5 [IU] via SUBCUTANEOUS
  Filled 2018-07-30 (×3): qty 0.05

## 2018-07-30 MED ORDER — SODIUM CHLORIDE 0.9 % IV SOLN: 250.0000 mL | INTRAVENOUS | Status: DC | PRN

## 2018-07-30 MED ORDER — ASPIRIN EC 81 MG PO TBEC
81.0000 mg | DELAYED_RELEASE_TABLET | Freq: Every day | ORAL | Status: DC
  Administered 2018-07-30 – 2018-08-01 (×3): 81 mg via ORAL
  Filled 2018-07-30 (×3): qty 1

## 2018-07-30 MED ORDER — ACETAMINOPHEN 500 MG PO TABS
1000.0000 mg | ORAL_TABLET | Freq: Once | ORAL | Status: AC
  Administered 2018-07-30: 1000 mg via ORAL
  Filled 2018-07-30: qty 2

## 2018-07-30 MED ORDER — SODIUM CHLORIDE 0.9% FLUSH: 3.0000 mL | INTRAVENOUS | Status: DC | PRN

## 2018-07-30 MED ORDER — FUROSEMIDE 10 MG/ML IJ SOLN
40.0000 mg | Freq: Once | INTRAMUSCULAR | Status: AC
  Administered 2018-07-30: 40 mg via INTRAVENOUS
  Filled 2018-07-30: qty 4

## 2018-07-30 MED ORDER — ACETAMINOPHEN 500 MG PO TABS
500.0000 mg | ORAL_TABLET | Freq: Four times a day (QID) | ORAL | Status: DC | PRN
  Administered 2018-07-30 – 2018-08-01 (×6): 500 mg via ORAL
  Filled 2018-07-30 (×6): qty 1

## 2018-07-30 MED ORDER — INSULIN ASPART 100 UNIT/ML ~~LOC~~ SOLN
0.0000 [IU] | Freq: Three times a day (TID) | SUBCUTANEOUS | Status: DC
  Administered 2018-07-31 (×2): 8 [IU] via SUBCUTANEOUS
  Administered 2018-07-31: 3 [IU] via SUBCUTANEOUS
  Administered 2018-08-01: 8 [IU] via SUBCUTANEOUS

## 2018-07-30 MED ORDER — FUROSEMIDE 10 MG/ML IJ SOLN
40.0000 mg | Freq: Every day | INTRAMUSCULAR | Status: DC
  Administered 2018-07-30 – 2018-07-31 (×2): 40 mg via INTRAVENOUS
  Filled 2018-07-30 (×2): qty 4

## 2018-07-30 MED ORDER — NICOTINE 21 MG/24HR TD PT24
21.0000 mg | MEDICATED_PATCH | Freq: Every day | TRANSDERMAL | Status: DC
  Administered 2018-07-30 – 2018-08-01 (×3): 21 mg via TRANSDERMAL
  Filled 2018-07-30 (×3): qty 1

## 2018-07-30 MED ORDER — INSULIN ASPART 100 UNIT/ML ~~LOC~~ SOLN
0.0000 [IU] | Freq: Three times a day (TID) | SUBCUTANEOUS | Status: DC
  Administered 2018-07-30: 5 [IU] via SUBCUTANEOUS
  Administered 2018-07-30: 2 [IU] via SUBCUTANEOUS
  Administered 2018-07-30: 8 [IU] via SUBCUTANEOUS

## 2018-07-30 MED ORDER — ONDANSETRON HCL 4 MG/2ML IJ SOLN: 4.0000 mg | Freq: Four times a day (QID) | INTRAMUSCULAR | Status: DC | PRN

## 2018-07-30 NOTE — ED Notes (Signed)
Pt reports lower back pain. Pt was ambulatory to the room with no issues or problems.

## 2018-07-30 NOTE — Care Management Note (Signed)
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 Attending MD at discharge please send scripts to [facility] pharmacy. CM will continue to follow for progression of care.  Expected Discharge Date:   possibly 07/31/2018               Expected Discharge Plan:  Home/Self Care  In-House Referral:  Clinical Social Work  Discharge planning Services  CM Consult  Status of Service:  In process, will continue to follow  Reola Mosher [phone] 07/30/2018, 10:49 AM

## 2018-07-30 NOTE — Progress Notes (Addendum)
Patient was admitted after midnight see full H&P for details.  Past medical history significant for polysubstance abuse(cocaine), DM type II, hepatitis C, systolic CHF EF 30-35% on 03/16/2018, homelessness, schizophrenia, and noncompliance with medications.  Patient admits to still smoking cocaine every day as well as smoking cigarettes.Will continue with Lasix 40 mg IV daily and may increase to twice daily. Strict I&O's and daily weights.  Blood sugars initially elevated at 245.  Patient had not been taking his insulin.  Will start on Lantus 5 units every morning with CBGs q. before meals utilizing moderate sliding scale insulin.  Will consult case manager and social work.  Added on nicotine patch.

## 2018-07-30 NOTE — H&P (Signed)
History and Physical    [name] ZOX:[id number] DOB: December 03, 1960 DOA: 07/29/2018  PCP: Lavinia SharpsPlacey, [name], NP  Patient coming from: Home  Chief Complaint: He was cold  HPI: Taylor MemosFrederick L Barbar is a 58 y.o. male with medical history significant of homelessness, congestive heart failure systolic dysfunction last EF around 35%, cocaine abuse, hypertension, schizophrenia with multiple emergency room visits mainly secondary to homelessness comes to the ED tonight because he was cold.  He was put in a room to be evaluated noted to be mildly tachycardic with a low-grade fever.  Patient is complaining of shortness of breath and lower extremity edema.  He is also complaining of a lot of nasal congestion and cough.  Patient is very noncompliant with his Lasix at home.  He is clearly been in the waiting room here very frequently with a lot of fluid going on here so he has had many sick contacts in the waiting room.  He denies any pain anywhere.  Patient is being referred for admission for CHF exacerbation and possibility of viral infection going on.  Review of Systems: As per HPI otherwise 10 point review of systems negative.   Past Medical History:  Diagnosis Date  . CHF (congestive heart failure) (HCC)   . Chronic foot pain   . Cocaine abuse (HCC)   . Diabetes mellitus without complication (HCC)   . Hepatitis C   . Homelessness   . Hypertension   . Neuropathy   . Polysubstance abuse (HCC)   . Schizophrenia [facility](HCC)     Past Surgical History:  Procedure Laterality Date  . MULTIPLE TOOTH EXTRACTIONS       reports that he has been smoking cigarettes. He has a 20.00 pack-year smoking history. He uses smokeless tobacco. He reports current alcohol use. He reports current drug use. Frequency: 7.00 times per week. Drugs: "Crack" cocaine, Cocaine, and Marijuana.  Allergies  Allergen Reactions  . Haldol [Haloperidol] Other (See Comments)    Muscle spasms, loss of voluntary movement. However,  pt has taken Thorazine on multiple occasions with no adverse effects.     Family History  Problem Relation Age of Onset  . Hypertension Other   . Diabetes Other     Prior to Admission medications   Medication Sig Start Date End Date Taking? Authorizing Provider  acetaminophen (TYLENOL) 500 MG tablet Take 1 tablet (500 mg total) by mouth every 6 (six) hours as needed. 07/28/18   Antony MaduraHumes, Kelly, PA-C  amLODipine (NORVASC) 10 MG tablet Take 1 tablet (10 mg total) by mouth daily. Patient not taking: Reported on 07/23/2018 07/22/18   Elgergawy, Leana Roeawood S, MD  aspirin EC 81 MG tablet Take 1 tablet (81 mg total) by mouth daily. Patient not taking: Reported on 07/15/2018 03/30/18 03/30/19  Dorrell, Cathleen Cortieborah N, MD  atorvastatin (LIPITOR) 40 MG tablet Take 1 tablet (40 mg total) by mouth daily. Patient not taking: Reported on 07/15/2018 03/30/18   Dorrell, Cathleen Cortieborah N, MD  furosemide (LASIX) 20 MG tablet Take 40 mg oral daily, and 20 mg every evening Patient not taking: Reported on 07/23/2018 07/21/18   Elgergawy, Leana Roeawood S, MD  ibuprofen (ADVIL,MOTRIN) 400 MG tablet Take 1 tablet (400 mg total) by mouth every 6 (six) hours as needed. 07/24/18   Law, Waylan BogaAlexandra M, PA-C  Insulin Glargine (LANTUS) 100 UNIT/ML Solostar Pen Inject 20 Units into the skin daily. Patient not taking: Reported on 07/23/2018 07/21/18   Elgergawy, Leana Roeawood S, MD  Insulin Pen Needle 29G X 8MM MISC  Use with insulin 07/21/18   Elgergawy, Leana Roe, MD  lisinopril (PRINIVIL,ZESTRIL) 5 MG tablet Take 1 tablet (5 mg total) by mouth daily. Patient not taking: Reported on 07/15/2018 03/31/18   Dionne Ano, MD    Physical Exam: Vitals:   07/29/18 2010 07/30/18 0141 07/30/18 0327 07/30/18 0348  BP: (!) 156/89 (!) 149/81  (!) 142/94  Pulse: (!) 115 (!) 102  (!) 102  Resp: 18 18  (!) 23  Temp: 100 F (37.8 C)     TempSrc: Oral     SpO2: 92% 94%  93%  Weight:   77 kg   Height:   5\' 9"  (1.753 m)       Constitutional: NAD, calm, comfortable  nasal congestion noted Vitals:   07/29/18 2010 07/30/18 0141 07/30/18 0327 07/30/18 0348  BP: (!) 156/89 (!) 149/81  (!) 142/94  Pulse: (!) 115 (!) 102  (!) 102  Resp: 18 18  (!) 23  Temp: 100 F (37.8 C)     TempSrc: Oral     SpO2: 92% 94%  93%  Weight:   77 kg   Height:   5\' 9"  (1.753 m)    Eyes: PERRL, lids and conjunctivae normal ENMT: Mucous membranes are moist. Posterior pharynx clear of any exudate or lesions.Normal dentition.  Neck: normal, supple, no masses, no thyromegaly Respiratory: clear to auscultation bilaterally, no wheezing, no crackles. Normal respiratory effort. No accessory muscle use.  Cardiovascular: Regular rate and rhythm, no murmurs / rubs / gallops.  1-2+ extremity edema. 2+ pedal pulses. No carotid bruits.  Abdomen: no tenderness, no masses palpated. No hepatosplenomegaly. Bowel sounds positive.  Musculoskeletal: no clubbing / cyanosis. No joint deformity upper and lower extremities. Good ROM, no contractures. Normal muscle tone.  Skin: no rashes, lesions, ulcers. No induration Neurologic: CN 2-12 grossly intact. Sensation intact, DTR normal. Strength 5/5 in all 4.  Psychiatric: Normal judgment and insight. Alert and oriented x 3. Normal mood.    Labs on Admission: I have personally reviewed following labs and imaging studies  CBC: Recent Labs  Lab 07/23/18 0523 07/28/18 2348 07/30/18 0216  WBC 10.8* 9.5 7.2  NEUTROABS 9.2*  --  6.3  HGB 11.6* 10.9* 10.3*  HCT 39.6 37.2* 33.7*  MCV 89.0 89.4 87.3  PLT 306 228 206   Basic Metabolic Panel: Recent Labs  Lab 07/23/18 0523 07/28/18 2348 07/30/18 0216  NA 140 142 137  K 3.7 3.4* 3.8  CL 102 109 106  CO2 29 25 22   GLUCOSE 246* 137* 231*  BUN 28* 23* 17  CREATININE 0.98 0.91 0.88  CALCIUM 9.0 8.9 7.9*   GFR: Estimated Creatinine Clearance: 92.6 mL/min (by C-G formula based on SCr of 0.88 mg/dL). Liver Function Tests: Recent Labs  Lab 07/23/18 0523 07/28/18 2348 07/30/18 0216  AST 47*  53* 54*  ALT 27 41 41  ALKPHOS 128* 92 79  BILITOT 0.8 0.9 1.0  PROT 7.1 6.5 5.7*  ALBUMIN 3.0* 2.9* 2.6*   Recent Labs  Lab 07/23/18 0523  LIPASE 36   No results for input(s): AMMONIA in the last 168 hours. Coagulation Profile: No results for input(s): INR, PROTIME in the last 168 hours. Cardiac Enzymes: Recent Labs  Lab 07/30/18 0216  TROPONINI 0.03*   BNP (last 3 results) No results for input(s): PROBNP in the last 8760 hours. HbA1C: No results for input(s): HGBA1C in the last 72 hours. CBG: No results for input(s): GLUCAP in the last 168 hours. Lipid Profile: No  results for input(s): CHOL, HDL, LDLCALC, TRIG, CHOLHDL, LDLDIRECT in the last 72 hours. Thyroid Function Tests: No results for input(s): TSH, T4TOTAL, FREET4, T3FREE, THYROIDAB in the last 72 hours. Anemia Panel: No results for input(s): VITAMINB12, FOLATE, FERRITIN, TIBC, IRON, RETICCTPCT in the last 72 hours. Urine analysis:    Component Value Date/Time   COLORURINE YELLOW 07/28/2018 2346   APPEARANCEUR CLEAR 07/28/2018 2346   LABSPEC 1.017 07/28/2018 2346   PHURINE 6.0 07/28/2018 2346   GLUCOSEU 50 (A) 07/28/2018 2346   HGBUR SMALL (A) 07/28/2018 2346   BILIRUBINUR NEGATIVE 07/28/2018 2346   KETONESUR NEGATIVE 07/28/2018 2346   PROTEINUR 100 (A) 07/28/2018 2346   UROBILINOGEN 1.0 03/14/2015 0610   NITRITE NEGATIVE 07/28/2018 2346   LEUKOCYTESUR NEGATIVE 07/28/2018 2346   Sepsis Labs: !!!!!!!!!!!!!!!!!!!!!!!!!!!!!!!!!!!!!!!!!!!! @LABRCNTIP (procalcitonin:4,lacticidven:4) ) Recent Results (from the past 240 hour(s))  Group A Strep by PCR     Status: Abnormal   Collection Time: 07/24/18  6:26 AM  Result Value Ref Range Status   Group A Strep by PCR DETECTED (A) NOT DETECTED Final    Comment: Performed at [facility], [address], [place], [place] [address]     Radiological Exams on Admission: Dg Chest 2 View  Result Date: 07/30/2018 CLINICAL DATA:  Fever EXAM: CHEST - 2 VIEW  COMPARISON:  07/29/2018, 07/24/2018 FINDINGS: Cardiomegaly with vascular congestion and moderate pulmonary edema. Subsegmental atelectasis in the left mid lung. Trace pleural effusions. No pneumothorax. Aortic atherosclerosis. IMPRESSION: Cardiomegaly with vascular congestion, moderate pulmonary edema trace pleural effusions. Subsegmental atelectasis in the left mid lung. Electronically Signed   By: [name]   On: 07/30/2018 01:56   Dg Chest 2 View  Result Date: 07/29/2018 CLINICAL DATA:  Chest pain and shortness of breath and cough. EXAM: CHEST - 2 VIEW COMPARISON:  07/24/2018 and 07/15/2018 FINDINGS: The patient has developed new mild cardiomegaly even considering the AP technique. There is new mild pulmonary vascular congestion. There is a new slight accentuation of the interstitial markings with new fluid along the fissures as well as increased atelectasis in the region of the lingula. No discrete effusions. No acute bone abnormality. Incidental note is made of a metal pellet in the soft tissues in the anterior aspect of the lower right chest, unchanged. Slight aortic atherosclerosis. IMPRESSION: Findings consistent with mild congestive heart failure. Electronically Signed   By: Francene Boyers M.D.   On: 07/29/2018 08:18    EKG: Independently reviewed.  Normal sinus rhythm to inversion lateral leads old compared to old EKG Old chart reviewed Case discussed with Dr. Elesa Massed in the ED Chest x-ray reviewed edema trace pleural effusions  Assessment/Plan 58 year old male with acute on chronic congestive heart failure exacerbation plus or minus influenza Principal Problem:   Acute exacerbation of CHF (congestive heart failure) (HCC)-placed patient on Lasix 40 mg IV daily.  He is received a dose in the emergency department and is already diuresing.  O2 sats are normal on room air.  Will not repeat echo as he has had 1 in the last year.  Active Problems:   Essential hypertension, benign-clarify  resume home meds    Cocaine use disorder, severe, dependence (HCC)-still actively using    Chronic paranoid schizophrenia (HCC)-noted  Low-grade temperature with respiratory symptoms-check quick flu.   We will get social work involved prior to discharge secondary to his homeless status.   DVT prophylaxis: SCDs Code Status: Full Family Communication: None Disposition Plan: 1 day Consults called: None Admission status: Observation  Daeja Helderman A MD [facility]  If 7PM-7AM, please contact night-coverage [email or web address] Password [email or web address]  07/30/2018, 4:34 AM

## 2018-07-30 NOTE — Clinical Social Work Note (Addendum)
Clinical Social Work Assessment  Patient Details  Name: [name] MRN: [id number] Date of Birth: 1961-03-06  Date of referral:  07/30/18               Reason for consult:  Housing Concerns/Homelessness, Substance Use/ETOH Abuse                Permission sought to share information with:    Permission granted to share information::  No  Name::        Agency::     Relationship::     Contact Information:     Housing/Transportation Living arrangements for the past 2 months:  [place] of Information:  Patient, Medical Team Patient Interpreter Needed:  None Criminal Activity/Legal Involvement Pertinent to Current Situation/Hospitalization:  No - Comment as needed Significant Relationships:  None Lives with:  Self Do you feel safe going back to the place where you live?  No Need for family participation in patient care:  Yes (Comment)  Care giving concerns:  Homeless, substance abuse.   Social Worker assessment / plan:  CSW met with patient. No supports at bedside. CSW introduced role and inquired about resource needs. CSW is familiar with patient from last week. CSW gave him resources that admission as well as an FL2 to work on group home placement from the community. Patient had requested placement but CSW told him then that we cannot place him in a group home from the hospital. This admission patient requested that [facility] place him in a group home again. When CSW reiterated conversation from last week he told CSW to get out of his room and not come back. Left shelter list, emergency assistance, substance abuse, and food resources and his chart if he's interested. No further concerns. CSW signing off.  12:12 pm: Gave RN list of local group homes per RNCM/patient request to give to patient. He told RNCM he would call the facilities to see if he is eligible. CSW signing off.  Employment status:  Unemployed Forensic scientist:  Medicaid In [place] PT Recommendations:   Not assessed at this time Information / Referral to community resources:  Other (Comment Required), Shelter, Residential Substance Abuse Treatment Options, Outpatient Substance Abuse Treatment Options(Emergency Assistance, food resources.)  Patient/Family's Response to care:  Patient refused resources and told CSW to not come back to his room. No support system known.   Patient/Family's Understanding of and Emotional Response to Diagnosis, Current Treatment, and Prognosis:  Patient has a good understanding of the reason for admission and social work consult. Patient angry that CSW cannot place him in a group home.  Emotional Assessment Appearance:  Appears stated age Attitude/Demeanor/Rapport:  Aggressive (Verbally and/or physically), Hostile Affect (typically observed):  Frustrated, Agitated Orientation:  Oriented to Self, Oriented to Place, Oriented to  Time, Oriented to Situation Alcohol / Substance use:  Illicit Drugs Psych involvement (Current and /or in the community):  Outpatient Provider(He told this CSW last week he follows up at [facility]  Discharge Needs  Concerns to be addressed:  Care Coordination Readmission within the last 30 days:  Yes Current discharge risk:  Homeless, Substance Abuse Barriers to Discharge:  Continued Medical Work up   Candie Chroman, LCSW 07/30/2018, 10:26 AM

## 2018-07-30 NOTE — ED Provider Notes (Signed)
TIME SEEN: 12:33 AM  CHIEF COMPLAINT: Multiple complaints  HPI: Patient is a 58 year old male with history of homelessness, schizophrenia and substance abuse who is well-known to our emergency department who presented to triage with multiple complaints.  Per the triage note patient was complaining of shortness of breath, feeling dehydrated, back pain.  When I enter the room and asked him how he is feeling he states "doc I feel just fine".  He has no complaints of pain to me at this time.  He does endorse lower extremity swelling but denies any shortness of breath currently.  Does appear that he has a fever and I suspect he may have a viral illness, possible influenza especially given he frequents our emergency department regularly.  Given his history of schizophrenia, he is a very poor historian.  Unable to obtain a reliable history from the patient.  Will obtain labs, chest x-ray and urine today.  He is tachycardic here.  Will obtain EKG.  Will give Tylenol for fever.  He states that he thinks he needs to go to behavioral health hospital.  He states he feels like "I need to get into a program there".  He denies SI or HI.  Unable to tell me what program he thinks he needs.  He has been provided with outpatient resources for substance abuse in the past.  Echo 03/2018:  Study Conclusions  - Left ventricle: The cavity size was normal. There was severe   concentric hypertrophy. Systolic function was moderately to   severely reduced. The estimated ejection fraction was in the   range of 30% to 35%. There is hypokinesis of the inferoseptal   myocardium. Doppler parameters are consistent with a reversible   restrictive pattern, indicative of decreased left ventricular   diastolic compliance and/or increased left atrial pressure (grade   3 diastolic dysfunction). - Aortic valve: Trileaflet; mildly thickened, mildly calcified   leaflets. There was mild regurgitation. - Mitral valve: There was moderate  regurgitation. - Left atrium: The atrium was severely dilated.  Impressions:  - EF is reduced when compared to prior.  ROS: See HPI Constitutional: no fever  Eyes: no drainage  ENT: no runny nose   Cardiovascular:  no chest pain  Resp: no SOB  GI: no vomiting GU: no dysuria Integumentary: no rash  Allergy: no hives  Musculoskeletal: no leg swelling  Neurological: no slurred speech ROS otherwise negative  PAST MEDICAL HISTORY/PAST SURGICAL HISTORY:  Past Medical History:  Diagnosis Date  . CHF (congestive heart failure) (HCC)   . Chronic foot pain   . Cocaine abuse (HCC)   . Diabetes mellitus without complication (HCC)   . Hepatitis C   . Homelessness   . Hypertension   . Neuropathy   . Polysubstance abuse (HCC)   . Schizophrenia (HCC)     MEDICATIONS:  Prior to Admission medications   Medication Sig Start Date End Date Taking? Authorizing Provider  acetaminophen (TYLENOL) 500 MG tablet Take 1 tablet (500 mg total) by mouth every 6 (six) hours as needed. 07/28/18   Antony MaduraHumes, Kelly, PA-C  amLODipine (NORVASC) 10 MG tablet Take 1 tablet (10 mg total) by mouth daily. Patient not taking: Reported on 07/23/2018 07/22/18   Elgergawy, Leana Roeawood S, MD  aspirin EC 81 MG tablet Take 1 tablet (81 mg total) by mouth daily. Patient not taking: Reported on 07/15/2018 03/30/18 03/30/19  Dorrell, Cathleen Cortieborah N, MD  atorvastatin (LIPITOR) 40 MG tablet Take 1 tablet (40 mg total) by mouth daily. Patient  not taking: Reported on 07/15/2018 03/30/18   Dorrell, Cathleen Corti, MD  furosemide (LASIX) 20 MG tablet Take 40 mg oral daily, and 20 mg every evening Patient not taking: Reported on 07/23/2018 07/21/18   Elgergawy, Leana Roe, MD  ibuprofen (ADVIL,MOTRIN) 400 MG tablet Take 1 tablet (400 mg total) by mouth every 6 (six) hours as needed. 07/24/18   Law, Waylan Boga, PA-C  Insulin Glargine (LANTUS) 100 UNIT/ML Solostar Pen Inject 20 Units into the skin daily. Patient not taking: Reported on 07/23/2018 07/21/18    Elgergawy, Leana Roe, MD  Insulin Pen Needle 29G X MISC Use with insulin 07/21/18   Elgergawy, Leana Roe, MD  lisinopril (PRINIVIL,ZESTRIL) 5 MG tablet Take 1 tablet (5 mg total) by mouth daily. Patient not taking: Reported on 07/15/2018 03/31/18   Dorrell, Cathleen Corti, MD    ALLERGIES:  Allergies  Allergen Reactions  . Haldol [Haloperidol] Other (See Comments)    Muscle spasms, loss of voluntary movement. However, pt has taken Thorazine on multiple occasions with no adverse effects.     SOCIAL HISTORY:  Social History   Tobacco Use  . Smoking status: Current Every Day Smoker    Packs/day: 1.00    Years: 20.00    Pack years: 20.00    Types: Cigarettes  . Smokeless tobacco: Current User  Substance Use Topics  . Alcohol use: Yes    Comment: Daily Drinker     FAMILY HISTORY: Family History  Problem Relation Age of Onset  . Hypertension Other   . Diabetes Other     EXAM: BP (!) 156/89 (BP Location: Left Arm)   Pulse (!) 115   Temp 100 F (37.8 C) (Oral)   Resp 18   SpO2 92%  CONSTITUTIONAL: Alert and oriented and responds appropriately to questions.  Chronically ill-appearing.  Smells of alcohol. HEAD: Normocephalic EYES: Conjunctivae clear, pupils appear equal, EOMI ENT: normal nose; moist mucous membranes NECK: Supple, no meningismus, no nuchal rigidity, no LAD  CARD: Regular and tachycardic; S1 and S2 appreciated; no murmurs, no clicks, no rubs, no gallops RESP: Normal chest excursion without splinting or tachypnea; breath sounds clear and equal bilaterally; no wheezes, no rhonchi, no rales, no hypoxia or respiratory distress, speaking full sentences ABD/GI: Normal bowel sounds; non-distended; soft, non-tender, no rebound, no guarding, no peritoneal signs, no hepatosplenomegaly BACK:  The back appears normal and is non-tender to palpation, there is no CVA tenderness EXT: Normal ROM in all joints; non-tender to palpation; mild edema in his bilateral ankles and feet;  normal capillary refill; no cyanosis, no calf tenderness or swelling    SKIN: Normal color for age and race; warm; no rash NEURO: Moves all extremities equally, reports normal sensation diffusely PSYCH: Patient smells strongly of alcohol.  Poor hygiene.  Denies SI or HI.  Easily agitated but redirectable.  MEDICAL DECISION MAKING: Patient here with multiple complaints.  History is limited given patient is a very poor historian secondary to his history of substance abuse and psychiatric illness.  He denies SI or HI and seems to be at his baseline where he is agitated but directable.  I do not feel he needs emergent TTS evaluation.  He had multiple complaints in triage but denies any pain to me now.  He has been found to be febrile here and tachycardic.  I suspect that he could have viral illness, possible influenza.  Unclear how long patient has had fever.  Would not be a candidate for Tamiflu and unfortunately would  likely not be able to afford this medication.  Will obtain labs, EKG, chest x-ray and urine.  Will give Tylenol for fever in the ED and monitor closely.  ED PROGRESS: Patient's labs unremarkable.  Ethanol level negative.  Chest x-ray concerning for moderate pulmonary edema with trace pleural effusions, cardiomegaly.  No infiltrate.  Will obtain troponin, BNP and give IV Lasix.  Troponin mildly elevated at 0.03 which appears to be his baseline.  BNP greater than 1000.  Discussed with patient regarding admission and he states he would prefer admission and agrees to stay in the hospital for diuresis.  We will swab him for the flu as well given we are going to admit him to the hospital and given he is febrile.  No sign of pneumonia on chest x-ray.  Urine pending.  Urine drug screen yesterday was positive for cocaine.  I am concerned that he would not do well as an outpatient for diuresis given he is homeless, medically noncompliant and continues to use drugs.  Will admit for IV diuresis.  4:21 AM  Discussed patient's case with hospitalist, Dr. [name].  I have recommended admission and patient (and family if present) agree with this plan. Admitting physician will place admission orders.   I reviewed all nursing notes, vitals, pertinent previous records, EKGs, lab and urine results, imaging (as available).      Date: 07/30/2018 3:47 AM  Rate: 98  Rhythm: normal sinus rhythm  QRS Axis: normal  Intervals: normal  ST/T Wave abnormalities: T wave inversions in lateral leads, inferior leads which are unchanged compared to EKG in September  Conduction Disutrbances: none  Narrative Interpretation: T wave inversions in inferior and lateral leads unchanged compared to previous EKG in September         Aine Strycharz, [place], [place] 07/30/18 [phone]

## 2018-07-31 DIAGNOSIS — Z7982 Long term (current) use of aspirin: Secondary | ICD-10-CM | POA: Diagnosis not present

## 2018-07-31 DIAGNOSIS — Z9119 Patient's noncompliance with other medical treatment and regimen: Secondary | ICD-10-CM | POA: Diagnosis not present

## 2018-07-31 DIAGNOSIS — D649 Anemia, unspecified: Secondary | ICD-10-CM | POA: Diagnosis present

## 2018-07-31 DIAGNOSIS — Z9114 Patient's other noncompliance with medication regimen: Secondary | ICD-10-CM | POA: Diagnosis not present

## 2018-07-31 DIAGNOSIS — F17210 Nicotine dependence, cigarettes, uncomplicated: Secondary | ICD-10-CM | POA: Diagnosis present

## 2018-07-31 DIAGNOSIS — Z590 Homelessness: Secondary | ICD-10-CM | POA: Diagnosis not present

## 2018-07-31 DIAGNOSIS — E1142 Type 2 diabetes mellitus with diabetic polyneuropathy: Secondary | ICD-10-CM | POA: Diagnosis present

## 2018-07-31 DIAGNOSIS — F1420 Cocaine dependence, uncomplicated: Secondary | ICD-10-CM | POA: Diagnosis present

## 2018-07-31 DIAGNOSIS — Z794 Long term (current) use of insulin: Secondary | ICD-10-CM | POA: Diagnosis not present

## 2018-07-31 DIAGNOSIS — I110 Hypertensive heart disease with heart failure: Secondary | ICD-10-CM | POA: Diagnosis present

## 2018-07-31 DIAGNOSIS — Z8249 Family history of ischemic heart disease and other diseases of the circulatory system: Secondary | ICD-10-CM | POA: Diagnosis not present

## 2018-07-31 DIAGNOSIS — I10 Essential (primary) hypertension: Secondary | ICD-10-CM | POA: Diagnosis not present

## 2018-07-31 DIAGNOSIS — E1165 Type 2 diabetes mellitus with hyperglycemia: Secondary | ICD-10-CM

## 2018-07-31 DIAGNOSIS — Z833 Family history of diabetes mellitus: Secondary | ICD-10-CM | POA: Diagnosis not present

## 2018-07-31 DIAGNOSIS — Z79899 Other long term (current) drug therapy: Secondary | ICD-10-CM | POA: Diagnosis not present

## 2018-07-31 DIAGNOSIS — I509 Heart failure, unspecified: Secondary | ICD-10-CM | POA: Diagnosis present

## 2018-07-31 DIAGNOSIS — F200 Paranoid schizophrenia: Secondary | ICD-10-CM | POA: Diagnosis present

## 2018-07-31 DIAGNOSIS — I5023 Acute on chronic systolic (congestive) heart failure: Secondary | ICD-10-CM | POA: Diagnosis not present

## 2018-07-31 LAB — BASIC METABOLIC PANEL
Anion gap: 7 (ref 5–15)
BUN: 17 mg/dL (ref 6–20)
CO2: 26 mmol/L (ref 22–32)
Calcium: 8.2 mg/dL — ABNORMAL LOW (ref 8.9–10.3)
Chloride: 104 mmol/L (ref 98–111)
Creatinine, Ser: 0.92 mg/dL (ref 0.61–1.24)
GFR calc Af Amer: >60 mL/min (ref >60)
GFR calc non Af Amer: >60 mL/min (ref >60)
Glucose, Bld: 274 mg/dL — ABNORMAL HIGH (ref 70–99)
Potassium: 3.5 mmol/L (ref 3.5–5.1)
Sodium: 137 mmol/L (ref 135–145)

## 2018-07-31 LAB — GLUCOSE, CAPILLARY
Glucose-Capillary: 278 mg/dL — ABNORMAL HIGH (ref 70–99)
Glucose-Capillary: 275 mg/dL — ABNORMAL HIGH (ref 70–99)
Glucose-Capillary: 181 mg/dL — ABNORMAL HIGH (ref 70–99)
Glucose-Capillary: 258 mg/dL — ABNORMAL HIGH (ref 70–99)

## 2018-07-31 LAB — CBC WITH DIFFERENTIAL/PLATELET
Abs Immature Granulocytes: 0.02 10*3/uL (ref 0.00–0.07)
Basophils Absolute: 0 10*3/uL (ref 0.0–0.1)
Basophils Relative: 0 %
Eosinophils Absolute: 0 10*3/uL (ref 0.0–0.5)
Eosinophils Relative: 1 %
HCT: 33.3 % — ABNORMAL LOW (ref 39.0–52.0)
Hemoglobin: 10.3 g/dL — ABNORMAL LOW (ref 13.0–17.0)
Immature Granulocytes: 0 %
LYMPHS PCT: 12 %
Lymphs Abs: 0.8 10*3/uL (ref 0.7–4.0)
MCH: 26.8 pg (ref 26.0–34.0)
MCHC: 30.9 g/dL (ref 30.0–36.0)
MCV: 86.5 fL (ref 80.0–100.0)
Monocytes Absolute: 0.6 10*3/uL (ref 0.1–1.0)
Monocytes Relative: 9 %
Neutro Abs: 5.3 10*3/uL (ref 1.7–7.7)
Neutrophils Relative %: 78 %
Platelets: 177 10*3/uL (ref 150–400)
RBC: 3.85 MIL/uL — ABNORMAL LOW (ref 4.22–5.81)
RDW: 17.8 % — AB (ref 11.5–15.5)
WBC: 6.8 10*3/uL (ref 4.0–10.5)
nRBC: 0 % (ref 0.0–0.2)

## 2018-07-31 MED ORDER — INSULIN GLARGINE 100 UNIT/ML ~~LOC~~ SOLN
20.0000 [IU] | Freq: Every day | SUBCUTANEOUS | Status: DC
  Administered 2018-08-01: 20 [IU] via SUBCUTANEOUS
  Filled 2018-07-31: qty 0.2

## 2018-07-31 MED ORDER — ATORVASTATIN CALCIUM 40 MG PO TABS
40.0000 mg | ORAL_TABLET | Freq: Every day | ORAL | Status: DC
  Administered 2018-07-31 – 2018-08-01 (×2): 40 mg via ORAL
  Filled 2018-07-31 (×2): qty 1

## 2018-07-31 MED ORDER — IOPAMIDOL (ISOVUE-300) INJECTION 61%
INTRAVENOUS | Status: AC
  Filled 2018-07-31: qty 50

## 2018-07-31 MED ORDER — INSULIN GLARGINE 100 UNIT/ML ~~LOC~~ SOLN
10.0000 [IU] | Freq: Every day | SUBCUTANEOUS | Status: DC
  Administered 2018-07-31: 10 [IU] via SUBCUTANEOUS
  Filled 2018-07-31: qty 0.1

## 2018-07-31 MED ORDER — AMLODIPINE BESYLATE 10 MG PO TABS
10.0000 mg | ORAL_TABLET | Freq: Every day | ORAL | Status: DC
  Administered 2018-07-31 – 2018-08-01 (×2): 10 mg via ORAL
  Filled 2018-07-31 (×2): qty 1

## 2018-07-31 MED ORDER — LISINOPRIL 5 MG PO TABS
5.0000 mg | ORAL_TABLET | Freq: Every day | ORAL | Status: DC
  Administered 2018-07-31 – 2018-08-01 (×2): 5 mg via ORAL
  Filled 2018-07-31 (×2): qty 1

## 2018-07-31 MED ORDER — FUROSEMIDE 10 MG/ML IJ SOLN
40.0000 mg | Freq: Two times a day (BID) | INTRAMUSCULAR | Status: DC
  Administered 2018-07-31: 40 mg via INTRAVENOUS
  Filled 2018-07-31 (×2): qty 4

## 2018-07-31 NOTE — Progress Notes (Signed)
I responded to a [place] from the nurse to provide spiritual support for the patient. I visited the patient's room and listened with compassion as he talked about his health status. I shared words of encouragement and faith and led in prayer. I shared that the Chaplain is available for additional support as needed or requested.    07/31/18 1000  Clinical Encounter Type  Visited With Patient  Visit Type Spiritual support  Referral From Nurse  Consult/Referral To Chaplain  Spiritual Encounters  Spiritual Needs Prayer  Stress Factors  Patient Stress Factors Exhausted    Chaplain Dr Melvyn Novas

## 2018-07-31 NOTE — Progress Notes (Signed)
Progress Note    [name]  ZOX:[id number] DOB: Apr 17, 1961  DOA: 07/29/2018 PCP: Lavinia SharpsPlacey, [name], NP    Brief Narrative:   Chief complaint: Shortness of breath  Medical records reviewed and are as summarized below:  [name] is an 58 y.o. male pmh  significant for polysubstance abuse(cocaine, tobacco), DM type II, hepatitis C, systolic CHF EF 30-35% on 03/16/2018, homelessness, schizophrenia, and noncompliance with medications; who presented with shortness of breath found to have acute exacerbation of CHF.  Assessment/Plan:   Principal Problem:   Acute exacerbation of CHF (congestive heart failure) (HCC) Active Problems:   Essential hypertension, benign   Cocaine use disorder, severe, dependence (HCC)   Chronic paranoid schizophrenia (HCC)   CHF (congestive heart failure) (HCC)  Acute on chronic congestive systolic heart failure.  Patient presented with complaints of shortness of breath.  Patient initially noted to have 3+ pitting edema on physical exam.  Labs revealed BNP 1089.7. Chest x-ray showing cardiomegaly and pulmonary edema.  Physical exam shows decreased lower extremity fluid and bibasilar crackles.  Telemetry monitoring only revealed sinus tachycardia. -Strict intake and output and daily weight -Lasix 40 mg IV increase to BID -Avoiding beta-blocker due to frequent use of cocaine -Will need follow-up in outpatient setting with cardiology  Normocytic normochromic anemia: Stable.  Hemoglobin on admission 10.3 and repeat check similar.  No reports of bleeding. -Continue to monitor  Essential hypertension: Patient previously had been given prescriptions for lisinopril and amlodipine but was not taking any of these medications on admission. -Add back lisinopril and amlodipine  Diabetes mellitus type 2.  Hemoglobin A1c last noted to be 7.3 on 07/18/2018.  Patient not taking any of his home meds.  Blood sugars currently in the 200s.  Patient declined  heart healthy and carb modified diet and wanted to be placed on regular diet. -Hypoglycemic protocols -CBGs with meals using moderate sliding scale insulin -Lantus increased to 10 units  Homelessness: Child psychotherapistocial worker was consulted and is currently in the process of trying to help patient get into a group home. -Follow-up with social work  Polysubstance abuse: UDS positive for cocaine which patient reports using every day that he can and has no will of wanting to quit.  He does also smoke and is okay with having a nicotine patch after counseling.  Patient appears to be acutely withdrawn. -Counseled on need cessation of cocaine and tobacco use -Nicotine patch  Paranoid schizophrenia: Patient currently not on any medication for treatment.  Body mass index is 27.42 kg/m.   Family Communication/Anticipated D/C date and plan/Code Status   DVT prophylaxis: Lovenox ordered. Code Status: Full Code.  Family Communication: No family present at bedside Disposition Plan: Possible discharge home in a.m.   Medical Consultants:    None.   Anti-Infectives:    None  Subjective:   Patient reports that his breathing is not better yet and he still waiting on a place to stay.  They were talking about trying to get him into a group home.  Objective:    Vitals:   07/30/18 1926 07/31/18 0607 07/31/18 0949 07/31/18 2038  BP: (!) 147/92 (!) 157/95 (!) 162/110 (!) 155/92  Pulse: 88 98 (!) 101 96  Resp: 18 20  (!) 22  Temp: 98.2 F (36.8 C) 98.2 F (36.8 C)  (!) 97.5 F (36.4 C)  TempSrc: Oral Oral  Oral  SpO2: 94% 92% 91% (!) 88%  Weight:  84.2 kg    Height:  Intake/Output Summary (Last 24 hours) at 07/31/2018 2121 Last data filed at 07/31/2018 2014 Gross per 24 hour  Intake 3120 ml  Output 2375 ml  Net 745 ml   Filed Weights   07/30/18 0327 07/30/18 0533 07/31/18 0607  Weight: 77 kg 82.8 kg 84.2 kg    Exam: Constitutional: Patient is fidgety and anxious Eyes: PERRL,  lids and conjunctivae normal ENMT: Mucous membranes are moist. Posterior pharynx clear of any exudate or lesions.   Neck: normal, supple, no masses, no thyromegaly Respiratory: Mild crackles noted in the mid to lower lung field.  Patient on room air maintaining O2 saturations no significant wheezes noted at this time. Cardiovascular: Tachycardic, no murmurs / rubs / gallops.  1+ pitting bilateral lower extremity edema. 2+ pedal pulses. No carotid bruits.  Abdomen: no tenderness, no masses palpated. No hepatosplenomegaly. Bowel sounds positive.  Musculoskeletal: no clubbing / cyanosis. No joint deformity upper and lower extremities. Good ROM, no contractures. Normal muscle tone.  Skin: no rashes, lesions, ulcers. No induration Neurologic: CN 2-12 grossly intact. Sensation intact, DTR normal. Strength 5/5 in all 4.  Psychiatric: Poor judgment and insight. Alert and oriented x 3. Normal mood.    Data Reviewed:   I have personally reviewed following labs and imaging studies:  Labs: Labs show the following:   Basic Metabolic Panel: Recent Labs  Lab 07/28/18 2348 07/30/18 0216 07/31/18 0557  NA 142 137 137  K 3.4* 3.8 3.5  CL 109 106 104  CO2 25 22 26   GLUCOSE 137* 231* 274*  BUN 23* 17 17  CREATININE 0.91 0.88 0.92  CALCIUM 8.9 7.9* 8.2*   GFR Estimated Creatinine Clearance: 88.6 mL/min (by C-G formula based on SCr of 0.92 mg/dL). Liver Function Tests: Recent Labs  Lab 07/28/18 2348 07/30/18 0216  AST 53* 54*  ALT 41 41  ALKPHOS 92 79  BILITOT 0.9 1.0  PROT 6.5 5.7*  ALBUMIN 2.9* 2.6*   No results for input(s): LIPASE, AMYLASE in the last 168 hours. No results for input(s): AMMONIA in the last 168 hours. Coagulation profile No results for input(s): INR, PROTIME in the last 168 hours.  CBC: Recent Labs  Lab 07/28/18 2348 07/30/18 0216 07/31/18 0557  WBC 9.5 7.2 6.8  NEUTROABS  --  6.3 5.3  HGB 10.9* 10.3* 10.3*  HCT 37.2* 33.7* 33.3*  MCV 89.4 87.3 86.5  PLT  228 206 177   Cardiac Enzymes: Recent Labs  Lab 07/30/18 0216  TROPONINI 0.03*   BNP (last 3 results) No results for input(s): PROBNP in the last 8760 hours. CBG: Recent Labs  Lab 07/30/18 2050 07/31/18 0745 07/31/18 1130 07/31/18 1631 07/31/18 2109  GLUCAP 214* 278* 275* 181* 258*   D-Dimer: No results for input(s): DDIMER in the last 72 hours. Hgb A1c: No results for input(s): HGBA1C in the last 72 hours. Lipid Profile: No results for input(s): CHOL, HDL, LDLCALC, TRIG, CHOLHDL, LDLDIRECT in the last 72 hours. Thyroid function studies: No results for input(s): TSH, T4TOTAL, T3FREE, THYROIDAB in the last 72 hours.  Invalid input(s): FREET3 Anemia work up: No results for input(s): VITAMINB12, FOLATE, FERRITIN, TIBC, IRON, RETICCTPCT in the last 72 hours. Sepsis Labs: Recent Labs  Lab 07/28/18 2348 07/30/18 0216 07/31/18 0557  WBC 9.5 7.2 6.8    Microbiology Recent Results (from the past 240 hour(s))  Group A Strep by PCR     Status: Abnormal   Collection Time: 07/24/18  6:26 AM  Result Value Ref Range Status  Group A Strep by PCR DETECTED (A) NOT DETECTED Final    Comment: Performed at [facility], [address] [place], [place] [address]    Procedures and diagnostic studies:  Dg Chest 2 View  Result Date: 07/30/2018 CLINICAL DATA:  Fever EXAM: CHEST - 2 VIEW COMPARISON:  07/29/2018, 07/24/2018 FINDINGS: Cardiomegaly with vascular congestion and moderate pulmonary edema. Subsegmental atelectasis in the left mid lung. Trace pleural effusions. No pneumothorax. Aortic atherosclerosis. IMPRESSION: Cardiomegaly with vascular congestion, moderate pulmonary edema trace pleural effusions. Subsegmental atelectasis in the left mid lung. Electronically Signed   By: Jasmine PangKim  Fujinaga M.D.   On: 07/30/2018 01:56    Medications:   . aspirin EC  81 mg Oral Daily  . furosemide  40 mg Intravenous Daily  . insulin aspart  0-15 Units Subcutaneous TID WC  . insulin  glargine  10 Units Subcutaneous Daily  . nicotine  21 mg Transdermal Daily  . sodium chloride flush  3 mL Intravenous Q12H   Continuous Infusions: . sodium chloride       LOS: 0 days   Aidin Doane [employer]  [facility]   *Please refer to [email or web address], password [email or web address] to get updated schedule on who will round on this patient, as hospitalists switch teams weekly. If 7PM-7AM, please contact night-coverage at [email or web address], password [email or web address] for any overnight needs.

## 2018-08-01 LAB — BASIC METABOLIC PANEL
Anion gap: 11 (ref 5–15)
BUN: 13 mg/dL (ref 6–20)
CO2: 25 mmol/L (ref 22–32)
Calcium: 8.4 mg/dL — ABNORMAL LOW (ref 8.9–10.3)
Chloride: 105 mmol/L (ref 98–111)
Creatinine, Ser: 0.92 mg/dL (ref 0.61–1.24)
GFR calc Af Amer: >60 mL/min (ref >60)
GFR calc non Af Amer: >60 mL/min (ref >60)
Glucose, Bld: 172 mg/dL — ABNORMAL HIGH (ref 70–99)
Potassium: 3.6 mmol/L (ref 3.5–5.1)
Sodium: 141 mmol/L (ref 135–145)

## 2018-08-01 LAB — GLUCOSE, CAPILLARY: Glucose-Capillary: 300 mg/dL — ABNORMAL HIGH (ref 70–99)

## 2018-08-01 MED ORDER — FUROSEMIDE 20 MG PO TABS: 20.0000 mg | ORAL_TABLET | Freq: Two times a day (BID) | ORAL | Status: DC

## 2018-08-01 MED ORDER — FUROSEMIDE 20 MG PO TABS: 20.0000 mg | ORAL_TABLET | Freq: Every evening | ORAL | Status: DC

## 2018-08-01 MED ORDER — FUROSEMIDE 40 MG PO TABS
40.0000 mg | ORAL_TABLET | Freq: Every morning | ORAL | Status: DC
  Administered 2018-08-01: 40 mg via ORAL

## 2018-08-01 NOTE — Progress Notes (Signed)
PROGRESS NOTE    STEPHAN [name]  RXV:[id number] DOB: 04/28/1961 DOA: 07/29/2018 PCP: Taylor Sharps, NP   Brief Narrative: Patient is a 58 year old male with history of polysubstance abuse including cocaine, tobacco, noncompliance, diabetes mellitus type 2, hepatitis C, chronic systolic CHF with ejection fraction of 30 to 35%, homelessness, schizophrenia who presents to the emergency department complains of shortness of breath.  He was admitted for the management of acute on chronic CHF exacerbation.  Assessment & Plan:   Principal Problem:   Acute exacerbation of CHF (congestive heart failure) (HCC) Active Problems:   Essential hypertension, benign   Cocaine use disorder, severe, dependence (HCC)   Chronic paranoid schizophrenia (HCC)   CHF (congestive heart failure) (HCC)   Acute on chronic CHF exacerbation: Presents with shortness of breath.  Found to have 3+ pitting edema on presentation.  BNP elevated.  Chest x-ray showed cardiomegaly and pulmonary edema.  Had bibasilar crackles on examination.  His echocardiogram done on 9/19 showed ejection fraction of 30 to 35%.  Echo not repeated on this admission. Patient is extremely noncompliant, also uses drugs.  He might not have been taking Lasix at home.  Persistently demands to drink fluids. Counseled for limitation of fluid intake to less than 1.2 L a day.  Continue to monitor strict intake and output, daily weight.  He is still net positive.  Patient's lower extremity edema has resolved.  His lungs are clear.  We will change lasix to oral.  Not on beta-blockers due to frequent use of cocaine.  He needs to follow-up with cardiology(heart failure team) as an outpatient.  Chronic normocytic anemia: Stable.  Continue to monitor CBC.  Hypertension: Currently blood pressure stable.  He was not on medications at home but was supposed to be on lisinopril and amlodipine.  Restarted.  Diabetes mellitus type 2: Hemoglobin A1c of 7.3 as  per 07/18/2018.  Not taking any medications at home.  Patient declined carb modified diet and wants to be on regular diet.  Continue sliding-scale insulin and Lantus.  He was on insulin but apparently  does not have a glucometer.  Diabetic oriented consulted.  Polysubstance abuse: UDS positive for cocaine.  Tobacco abuse.  Counseled for cessation.  Continue nicotine patch.  Social worker consulted for homelessness and polysubstance abuse.  Schizophrenia: Currently not on any medications.  Needs to follow-up with psychiatry as an outpatient.  Homelessness/noncompliance: Case Production designer, theatre/television/film and social worker already about the patient and put their recommendation.  Says  he does not have home to go         DVT prophylaxis: SCD Code Status: Full Family Communication: None present at the bedside Disposition Plan: Home after stabilization of CHF status.Likely tomorrow   Consultants: None  Procedures: None  Antimicrobials:  Anti-infectives (From admission, onward)   None      Subjective: Patient seen and examined the bedside this morning.  Remains comfortable.  No shortness of breath.  Lower extremity edema has resolved.  Still drinking plenty of fluids.  Demanding double portion of regular diet.  Objective: Vitals:   07/31/18 0607 07/31/18 0949 07/31/18 2038 08/01/18 0543  BP: (!) 157/95 (!) 162/110 (!) 155/92 (!) 148/93  Pulse: 98 (!) 101 96 90  Resp: 20  (!) 22 18  Temp: 98.2 F (36.8 C)  (!) 97.5 F (36.4 C) 97.8 F (36.6 C)  TempSrc: Oral  Oral Oral  SpO2: 92% 91% (!) 88% (!) 87%  Weight: 84.2 kg   79.6 kg  Height:  Intake/Output Summary (Last 24 hours) at 08/01/2018 0805 Last data filed at 08/01/2018 0500 Gross per 24 hour  Intake 3010 ml  Output 3375 ml  Net -365 ml   Filed Weights   07/30/18 0533 07/31/18 0607 08/01/18 0543  Weight: 82.8 kg 84.2 kg 79.6 kg    Examination:  General exam: Appears calm and comfortable ,Not in distress,average  built HEENT:PERRL,Oral mucosa moist, Ear/Nose normal on gross exam Respiratory system: Bilateral equal air entry, normal vesicular breath sounds, no wheezes or crackles  Cardiovascular system: S1 & S2 heard, RRR. No JVD, murmurs, rubs, gallops or clicks. No pedal edema. Gastrointestinal system: Abdomen is nondistended, soft and nontender. No organomegaly or masses felt. Normal bowel sounds heard. Central nervous system: Alert and oriented. No focal neurological deficits. Extremities: No edema, no clubbing ,no cyanosis, distal peripheral pulses palpable. Skin: No rashes, lesions or ulcers,no icterus ,no pallor MSK: Normal muscle bulk,tone ,power Psychiatry: Judgement and insight appear normal. Mood & affect appropriate.     Data Reviewed: I have personally reviewed following labs and imaging studies  CBC: Recent Labs  Lab 07/28/18 2348 07/30/18 0216 07/31/18 0557  WBC 9.5 7.2 6.8  NEUTROABS  --  6.3 5.3  HGB 10.9* 10.3* 10.3*  HCT 37.2* 33.7* 33.3*  MCV 89.4 87.3 86.5  PLT 228 206 177   Basic Metabolic Panel: Recent Labs  Lab 07/28/18 2348 07/30/18 0216 07/31/18 0557 08/01/18 0542  NA 142 137 137 141  K 3.4* 3.8 3.5 3.6  CL 109 106 104 105  CO2 25 22 26 25   GLUCOSE 137* 231* 274* 172*  BUN 23* 17 17 13   CREATININE 0.91 0.88 0.92 0.92  CALCIUM 8.9 7.9* 8.2* 8.4*   GFR: Estimated Creatinine Clearance: 88.6 mL/min (by C-G formula based on SCr of 0.92 mg/dL). Liver Function Tests: Recent Labs  Lab 07/28/18 2348 07/30/18 0216  AST 53* 54*  ALT 41 41  ALKPHOS 92 79  BILITOT 0.9 1.0  PROT 6.5 5.7*  ALBUMIN 2.9* 2.6*   No results for input(s): LIPASE, AMYLASE in the last 168 hours. No results for input(s): AMMONIA in the last 168 hours. Coagulation Profile: No results for input(s): INR, PROTIME in the last 168 hours. Cardiac Enzymes: Recent Labs  Lab 07/30/18 0216  TROPONINI 0.03*   BNP (last 3 results) No results for input(s): PROBNP in the last 8760  hours. HbA1C: No results for input(s): HGBA1C in the last 72 hours. CBG: Recent Labs  Lab 07/30/18 2050 07/31/18 0745 07/31/18 1130 07/31/18 1631 07/31/18 2109  GLUCAP 214* 278* 275* 181* 258*   Lipid Profile: No results for input(s): CHOL, HDL, LDLCALC, TRIG, CHOLHDL, LDLDIRECT in the last 72 hours. Thyroid Function Tests: No results for input(s): TSH, T4TOTAL, FREET4, T3FREE, THYROIDAB in the last 72 hours. Anemia Panel: No results for input(s): VITAMINB12, FOLATE, FERRITIN, TIBC, IRON, RETICCTPCT in the last 72 hours. Sepsis Labs: No results for input(s): PROCALCITON, LATICACIDVEN in the last 168 hours.  Recent Results (from the past 240 hour(s))  Group A Strep by PCR     Status: Abnormal   Collection Time: 07/24/18  6:26 AM  Result Value Ref Range Status   Group A Strep by PCR DETECTED (A) NOT DETECTED Final    Comment: Performed at [facility], [address] [place], [place] [address]         Radiology Studies: No results found.      Scheduled Meds: . amLODipine  10 mg Oral Daily  . aspirin  EC  81 mg Oral Daily  . atorvastatin  40 mg Oral Daily  . furosemide  40 mg Intravenous BID  . insulin aspart  0-15 Units Subcutaneous TID WC  . insulin glargine  20 Units Subcutaneous Daily  . lisinopril  5 mg Oral Daily  . nicotine  21 mg Transdermal Daily  . sodium chloride flush  3 mL Intravenous Q12H   Continuous Infusions: . sodium chloride       LOS: 1 day    Time spent: 35 mins.More than 50% of that time was spent in counseling and/or coordination of care.      Burnadette Pop, MD [facility] Pager [phone]  If 7PM-7AM, please contact night-coverage [email or web address] Password [email or web address] 08/01/2018, 8:05 AM

## 2018-08-01 NOTE — Progress Notes (Signed)
Patient is adamant about leaving AMA, MD Adhikari paged and he is aware of it. Patient has orders to ambulate in hall to monitor his oxygen saturations but is refusing to do so. He reported that he has to go.

## 2018-08-01 NOTE — Progress Notes (Signed)
Patient is educated about fluid intake but he is refusing to comply, he is yelling and requesting extra coffee.

## 2018-08-01 NOTE — Progress Notes (Signed)
Patient signed the AMA paper and left the unit with the charge nurse.

## 2018-08-01 NOTE — Progress Notes (Addendum)
Patient complaining of constipation. Says BM on 1/31 was small and hard and caused him to strain. Patient states he still feels like he needs to go, but cannot. Paged MD on call to request PRN laxative. No orders placed. Will pass along to AM nurse.

## 2018-08-01 NOTE — Progress Notes (Signed)
Patient demanding to get multiple drinks and ice creams per hour. Attempted to educate patient on importance of his fluid restriction, but pt became angry and kept insisting that he "isn't an infant" and he "doesn't need to be told what he can and can't have". Patient states that his "swelling has gone down" and that he "is in charge of his own healthcare and that in the streets he doesn't have a fluid restriction".

## 2018-08-01 NOTE — Progress Notes (Signed)
CN called to room  Pt stating he wants to leave  Educated pt on risks of leaving AMA  Pt understanding and signed AMA paper  Primary RN aware

## 2018-08-03 ENCOUNTER — Other Ambulatory Visit: Payer: Self-pay

## 2018-08-03 ENCOUNTER — Emergency Department (HOSPITAL_COMMUNITY): Payer: Medicaid Other

## 2018-08-03 ENCOUNTER — Encounter (HOSPITAL_COMMUNITY): Payer: Self-pay | Admitting: Emergency Medicine

## 2018-08-03 ENCOUNTER — Emergency Department (HOSPITAL_COMMUNITY)
Admission: EM | Admit: 2018-08-03 | Discharge: 2018-08-03 | Disposition: A | Discharge status: Home / Self Care | Payer: Medicaid Other | Attending: Emergency Medicine | Admitting: Emergency Medicine

## 2018-08-03 DIAGNOSIS — Z79899 Other long term (current) drug therapy: Secondary | ICD-10-CM | POA: Insufficient documentation

## 2018-08-03 DIAGNOSIS — F17210 Nicotine dependence, cigarettes, uncomplicated: Secondary | ICD-10-CM | POA: Insufficient documentation

## 2018-08-03 DIAGNOSIS — I509 Heart failure, unspecified: Secondary | ICD-10-CM | POA: Insufficient documentation

## 2018-08-03 DIAGNOSIS — Z794 Long term (current) use of insulin: Secondary | ICD-10-CM | POA: Insufficient documentation

## 2018-08-03 DIAGNOSIS — N4889 Other specified disorders of penis: Secondary | ICD-10-CM | POA: Diagnosis not present

## 2018-08-03 DIAGNOSIS — E119 Type 2 diabetes mellitus without complications: Secondary | ICD-10-CM | POA: Insufficient documentation

## 2018-08-03 DIAGNOSIS — Z7982 Long term (current) use of aspirin: Secondary | ICD-10-CM | POA: Insufficient documentation

## 2018-08-03 DIAGNOSIS — R0602 Shortness of breath: Secondary | ICD-10-CM | POA: Diagnosis not present

## 2018-08-03 DIAGNOSIS — I110 Hypertensive heart disease with heart failure: Secondary | ICD-10-CM | POA: Insufficient documentation

## 2018-08-03 LAB — CBC WITH DIFFERENTIAL/PLATELET
Abs Immature Granulocytes: 0.03 10*3/uL (ref 0.00–0.07)
Basophils Absolute: 0 10*3/uL (ref 0.0–0.1)
Basophils Relative: 1 %
Eosinophils Absolute: 0.2 10*3/uL (ref 0.0–0.5)
Eosinophils Relative: 3 %
HEMATOCRIT: 37.7 % — AB (ref 39.0–52.0)
Hemoglobin: 11.7 g/dL — ABNORMAL LOW (ref 13.0–17.0)
Immature Granulocytes: 0 %
LYMPHS ABS: 1.5 10*3/uL (ref 0.7–4.0)
Lymphocytes Relative: 17 %
MCH: 26.8 pg (ref 26.0–34.0)
MCHC: 31 g/dL (ref 30.0–36.0)
MCV: 86.5 fL (ref 80.0–100.0)
Monocytes Absolute: 0.7 10*3/uL (ref 0.1–1.0)
Monocytes Relative: 7 %
NEUTROS ABS: 6.4 10*3/uL (ref 1.7–7.7)
Neutrophils Relative %: 72 %
Platelets: 266 10*3/uL (ref 150–400)
RBC: 4.36 MIL/uL (ref 4.22–5.81)
RDW: 17.8 % — ABNORMAL HIGH (ref 11.5–15.5)
WBC: 8.9 10*3/uL (ref 4.0–10.5)
nRBC: 0 % (ref 0.0–0.2)

## 2018-08-03 LAB — BASIC METABOLIC PANEL
Anion gap: 13 (ref 5–15)
BUN: 22 mg/dL — ABNORMAL HIGH (ref 6–20)
CO2: 23 mmol/L (ref 22–32)
Calcium: 9 mg/dL (ref 8.9–10.3)
Chloride: 107 mmol/L (ref 98–111)
Creatinine, Ser: 0.97 mg/dL (ref 0.61–1.24)
GFR calc Af Amer: >60 mL/min (ref >60)
GFR calc non Af Amer: >60 mL/min (ref >60)
Glucose, Bld: 51 mg/dL — ABNORMAL LOW (ref 70–99)
POTASSIUM: 3.5 mmol/L (ref 3.5–5.1)
Sodium: 143 mmol/L (ref 135–145)

## 2018-08-03 LAB — BRAIN NATRIURETIC PEPTIDE: B Natriuretic Peptide: 955.6 pg/mL — ABNORMAL HIGH (ref 0.0–100.0)

## 2018-08-03 LAB — TROPONIN I: Troponin I: 0.05 ng/mL (ref <0.03)

## 2018-08-03 MED ORDER — AMLODIPINE BESYLATE 10 MG PO TABS: 10.0000 mg | ORAL_TABLET | Freq: Every day | ORAL | 0 refills | Status: DC

## 2018-08-03 MED ORDER — FUROSEMIDE 20 MG PO TABS
80.0000 mg | ORAL_TABLET | Freq: Once | ORAL | Status: AC
  Administered 2018-08-03: 80 mg via ORAL
  Filled 2018-08-03: qty 4

## 2018-08-03 MED ORDER — INSULIN PEN NEEDLE 29G X 8MM MISC: 0 refills | Status: DC

## 2018-08-03 MED ORDER — FUROSEMIDE 20 MG PO TABS: ORAL_TABLET | ORAL | 0 refills | Status: DC

## 2018-08-03 MED ORDER — ATORVASTATIN CALCIUM 40 MG PO TABS: 40.0000 mg | ORAL_TABLET | Freq: Every day | ORAL | 0 refills | Status: DC

## 2018-08-03 MED ORDER — INSULIN GLARGINE 100 UNIT/ML SOLOSTAR PEN: 20.0000 [IU] | PEN_INJECTOR | Freq: Every day | SUBCUTANEOUS | 0 refills | Status: DC

## 2018-08-03 MED ORDER — LISINOPRIL 5 MG PO TABS: 5.0000 mg | ORAL_TABLET | Freq: Every day | ORAL | 0 refills | Status: DC

## 2018-08-03 NOTE — ED Notes (Signed)
Pt spoke with Child psychotherapist on the phone.

## 2018-08-03 NOTE — ED Provider Notes (Addendum)
[facility] Provider Note   CSN: [id number] Arrival date & time: 08/03/18  [id number]     History   Chief Complaint Chief Complaint  Patient presents with  . Penis Pain  . Shortness of Breath    HPI [name] is a 58 y.o. male   Patient presents to the emergency department with multiple complaints.  Patient well-known to this ER for frequent visits. Patient reports that he was admitted to the hospital yesterday but left AGAINST MEDICAL ADVICE.  He reports that he left to do cocaine.  He thinks he left early.  He is concerned because he was supposed to be placed in a group home today and now he is worried that he will not get into the group home.  He continues to feel short of breath and has pain in his "penis and prostate".     Past Medical History:  Diagnosis Date  . CHF (congestive heart failure) (HCC)   . Chronic foot pain   . Cocaine abuse (HCC)   . Diabetes mellitus without complication (HCC)   . Hepatitis C   . Homelessness   . Hypertension   . Neuropathy   . Polysubstance abuse (HCC)   . Schizophrenia (HCC)   . Sleep apnea     Patient Active Problem List   Diagnosis Date Noted  . CHF (congestive heart failure) (HCC) 07/15/2018  . Acute exacerbation of CHF (congestive heart failure) (HCC) 03/28/2018  . Prostate enlargement 03/16/2018  . Aortic atherosclerosis (HCC) 03/16/2018  . 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 Chronic foot pain   . Schizoaffective disorder, bipolar type (HCC) 09/30/2016  . Substance induced mood disorder (HCC) 03/13/2015  . Acute kidney failure (HCC) 01/26/2015  . Schizophrenia, paranoid type (HCC) 01/17/2015  . Suicidal ideation   . Drug hallucinosis (HCC) 10/08/2014  . Chronic paranoid schizophrenia (HCC) 09/07/2014  . 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 Urinary retention   . Cocaine use disorder,  severe, dependence (HCC)   . Essential hypertension, benign 03/28/2013  . Diabetes mellitus (HCC) 03/15/2013    Past Surgical History:  Procedure Laterality Date  . MULTIPLE TOOTH EXTRACTIONS          Home Medications    Prior to Admission medications   Medication Sig Start Date End Date Taking? Authorizing Provider  acetaminophen (TYLENOL) 500 MG tablet Take 1 tablet (500 mg total) by mouth every 6 (six) hours as needed. 07/28/18   Antony MaduraHumes, Kelly, PA-C  amLODipine (NORVASC) 10 MG tablet Take 1 tablet (10 mg total) by mouth daily. 08/03/18   Gilda Crease,  J, MD  aspirin EC 81 MG tablet Take 1 tablet (81 mg total) by mouth daily. Patient not taking: Reported on 07/15/2018 03/30/18 03/30/19  Dorrell, Cathleen Cortieborah N, MD  atorvastatin (LIPITOR) 40 MG tablet Take 1 tablet (40 mg total) by mouth daily. 08/03/18   Gilda Crease,  J, MD  furosemide (LASIX) 20 MG tablet Take 40 mg oral daily, and 20 mg every evening 08/03/18   , Canary Brimhristopher J, MD  ibuprofen (ADVIL,MOTRIN) 400 MG tablet Take 1 tablet (400 mg total) by mouth every 6 (six) hours as needed. 07/24/18   Law, Waylan BogaAlexandra M, PA-C  Insulin Glargine (LANTUS) 100 UNIT/ML Solostar Pen Inject 20 Units into the skin daily. 08/03/18   Gilda Crease,  J, MD  Insulin Pen Needle 29G X 8MM MISC Use with insulin 08/03/18   , Canary Brimhristopher J, MD  lisinopril (PRINIVIL,ZESTRIL) 5 MG tablet Take 1 tablet (5 mg total) by mouth daily. 08/03/18   Gilda CreasePollina,  J, MD    Family History Family History  Problem Relation Age of Onset  . Hypertension Other   . Diabetes Other     Social History Social History   Tobacco Use  . Smoking status: Current Every Day Smoker    Packs/day: 1.00    Years: 20.00    Pack years: 20.00    Types: Cigarettes  . Smokeless tobacco: Current User  Substance Use Topics  . Alcohol use: Yes    Comment: Daily Drinker   . Drug use: Yes    Frequency: 7.0 times per week    Types: "Crack" cocaine, Cocaine,  Marijuana    Comment: Cocaine tonight, Marijuana "a long time"     Allergies   Haldol [haloperidol]   Review of Systems Review of Systems  Respiratory: Positive for shortness of breath.   Genitourinary: Positive for penile pain.  All other systems reviewed and are negative.    Physical Exam Updated Vital Signs BP (!) 180/105 (BP Location: Right Arm)   Pulse (!) 103   Temp 97.9 F (36.6 C) (Oral)   Resp 18   SpO2 94%   Physical Exam Vitals signs and nursing note reviewed.  Constitutional:      General: He is not in acute distress.    Appearance: Normal appearance. He is well-developed.  HENT:     Head: Normocephalic and atraumatic.     Right Ear: Hearing normal.     Left Ear: Hearing normal.     Nose: Nose normal.  Eyes:     Conjunctiva/sclera: Conjunctivae normal.     Pupils: Pupils are equal, round, and reactive to light.  Neck:     Musculoskeletal: Normal range of motion and neck supple.  Cardiovascular:     Rate and Rhythm: Regular rhythm.     Heart sounds: S1 normal and S2 normal. No murmur. No friction rub. No gallop.   Pulmonary:     Effort: Pulmonary effort is normal. No respiratory distress.     Breath sounds: Normal breath sounds.  Chest:     Chest wall: No tenderness.  Abdominal:     General: Bowel sounds are normal.     Palpations: Abdomen is soft.     Tenderness: There is no abdominal tenderness. There is no guarding or rebound. Negative signs include Murphy's sign and McBurney's sign.     Hernia: No hernia is present.  Genitourinary:    Penis: Normal.      Scrotum/Testes: Normal.  Musculoskeletal: Normal range of motion.  Skin:    General: Skin is warm and dry.     Findings: No rash.  Neurological:     Mental Status: He is alert and oriented to person, place, and time.     GCS: GCS eye subscore is 4. GCS verbal subscore is 5. GCS motor subscore is 6.     Cranial Nerves: No cranial nerve deficit.     Sensory: No sensory deficit.      Coordination: Coordination normal.  Psychiatric:        Speech: Speech normal.        Behavior: Behavior normal.        Thought Content: Thought content normal.      ED Treatments / Results  Labs (all labs ordered are listed, but only abnormal results are displayed) Labs Reviewed  CBC WITH DIFFERENTIAL/PLATELET - Abnormal; Notable for the following  components:      Result Value   Hemoglobin 11.7 (*)    HCT 37.7 (*)    RDW 17.8 (*)    All other components within normal limits  BASIC METABOLIC PANEL - Abnormal; Notable for the following components:   Glucose, Bld 51 (*)    BUN 22 (*)    All other components within normal limits  BRAIN NATRIURETIC PEPTIDE - Abnormal; Notable for the following components:   B Natriuretic Peptide 955.6 (*)    All other components within normal limits  TROPONIN I - Abnormal; Notable for the following components:   Troponin I 0.05 (*)    All other components within normal limits    EKG EKG Interpretation  Date/Time:  Monday August 03 2018 05:30:16 EST Ventricular Rate:  94 PR Interval:    QRS Duration: 85 QT Interval:  375 QTC Calculation: 469 R Axis:   -28 Text Interpretation:  Sinus rhythm Atrial premature complexes LVH with secondary repolarization abnormality Anterior Q waves, possibly due to LVH No significant change since last tracing Confirmed by Gilda Crease [phone]) on 08/03/2018 6:35:28 AM   Radiology Dg Chest 2 View  Result Date: 08/03/2018 CLINICAL DATA:  Shortness of breath EXAM: CHEST - 2 VIEW COMPARISON:  07/30/2018 FINDINGS: Cardiomegaly. Improved vascular congestion. Chronic scarring in the left mid lung. There is no edema, consolidation, effusion, or pneumothorax. Aortic tortuosity. Chronic metallic foreign body along the anterior right base. IMPRESSION: Baseline appearance of the chest.  No evidence of acute disease. Electronically Signed   By: [name]   On: 08/03/2018 05:05    Procedures Procedures  (including critical care time)  Medications Ordered in ED Medications - No data to display   Initial Impression / Assessment and Plan / ED Course  I have reviewed the triage vital signs and the nursing notes.  Pertinent labs & imaging results that were available during my care of the patient were reviewed by me and considered in my medical decision making (see chart for details).     Patient presents to the emergency department for evaluation of shortness of breath.  Patient was just hospitalized for congestive heart failure.  He left the hospital AGAINST MEDICAL ADVICE.  He admits that he left so he could smoke crack cocaine.  He reports that he is concerned because they were going to get him into a group home but when he left the hospital that might not happen.  He thinks going to a group home will get him to stop using cocaine.  Patient does not appear short of breath.  Lungs are clear.  Chest x-ray does not show any overt volume overload.  BNP is slightly elevated from his baseline.  He is in no distress.  I do not believe he requires repeat hospitalization.  Will provide additional Lasix.  Patient will await social work before discharge this morning.  Final Clinical Impressions(s) / ED Diagnoses   Final diagnoses:  Shortness of breath    ED Discharge Orders         Ordered    atorvastatin (LIPITOR) 40 MG tablet  Daily    Note to Pharmacy:  IM program   08/03/18 0622    furosemide (LASIX) 20 MG tablet     08/03/18 0622    amLODipine (NORVASC) 10 MG tablet  Daily     08/03/18 0622    Insulin Glargine (LANTUS) 100 UNIT/ML Solostar Pen  Daily     08/03/18 0622  Insulin Pen Needle 29G X MISC     08/03/18 0622    lisinopril (PRINIVIL,ZESTRIL) 5 MG tablet  Daily     08/03/18 0622           Gilda Crease, MD 08/03/18 1610    Gilda Crease, MD 08/03/18 [phone]

## 2018-08-03 NOTE — ED Notes (Signed)
Carb Modified diet ordered.

## 2018-08-03 NOTE — Discharge Instructions (Addendum)
Please return for any problem.  Follow-up with your regular care provider as instructed.  Please refrain from using cocaine or other drugs of abuse.

## 2018-08-03 NOTE — ED Notes (Signed)
Pt refused to sign the discharge form on the pin-pad.

## 2018-08-03 NOTE — Progress Notes (Addendum)
CSW aware of consult. Per notes, patient left AMA from hospital yesterday to do cocaine. Patient expressed he was supposed to be placed into a group home today and was worried that would not occur due to leaving. CSW reviewed notes from prior CSW that stated CSW explained to patient Group Home placement would not happen from the hospital. Per notes, prior CSW has had this conversation with patient on multiple occasions and on last occasion patient became angry and asked CSW to leave. Per notes, patient was provided with various resources for discharge. CSW to follow up with patient and RN as placement into a group home still cannot take place from the hospital.  8:05am- CSW spoke with patient via phone as CSW is covering remotely. CSW explained we could not place patient into a group home from the ED. Per patient, he was reaching out to the places provided by prior CSW and was told by a few places to reach back out this morning. Per patient, he was going to call them from the waiting room. CSW encouraged patient to follow up with the [facility] as they have phones patient can use. Patient expressed understanding and stated he was ready for discharge. No further CSW needs at this time. Please reconsult if needs arise.  Archie Balboa, LCSWA  Clinical Social Work Department  [employer]  [phone]

## 2018-08-03 NOTE — ED Triage Notes (Addendum)
Pt states he signed out AMA from hospital earlier tonight to go use crack.  C/o severe penis pain, SOB, productive cough with "purple" phlegm, and urinary/bowel incontinence.  Pt arrived to triage lying on EMS stretcher in body bag with urinary and bowel incontinence.  Charge RN notified and pt sent with EMT and EMS to decon shower.

## 2018-08-06 IMAGING — CR DG CHEST 2V
2 series · 3 of 3 positions shown · non-contrast
Comparison: 01/03/2018

CLINICAL DATA: Chest and abdominal pain after crack use today.

EXAM:
CHEST - 2 VIEW

[chest lat]
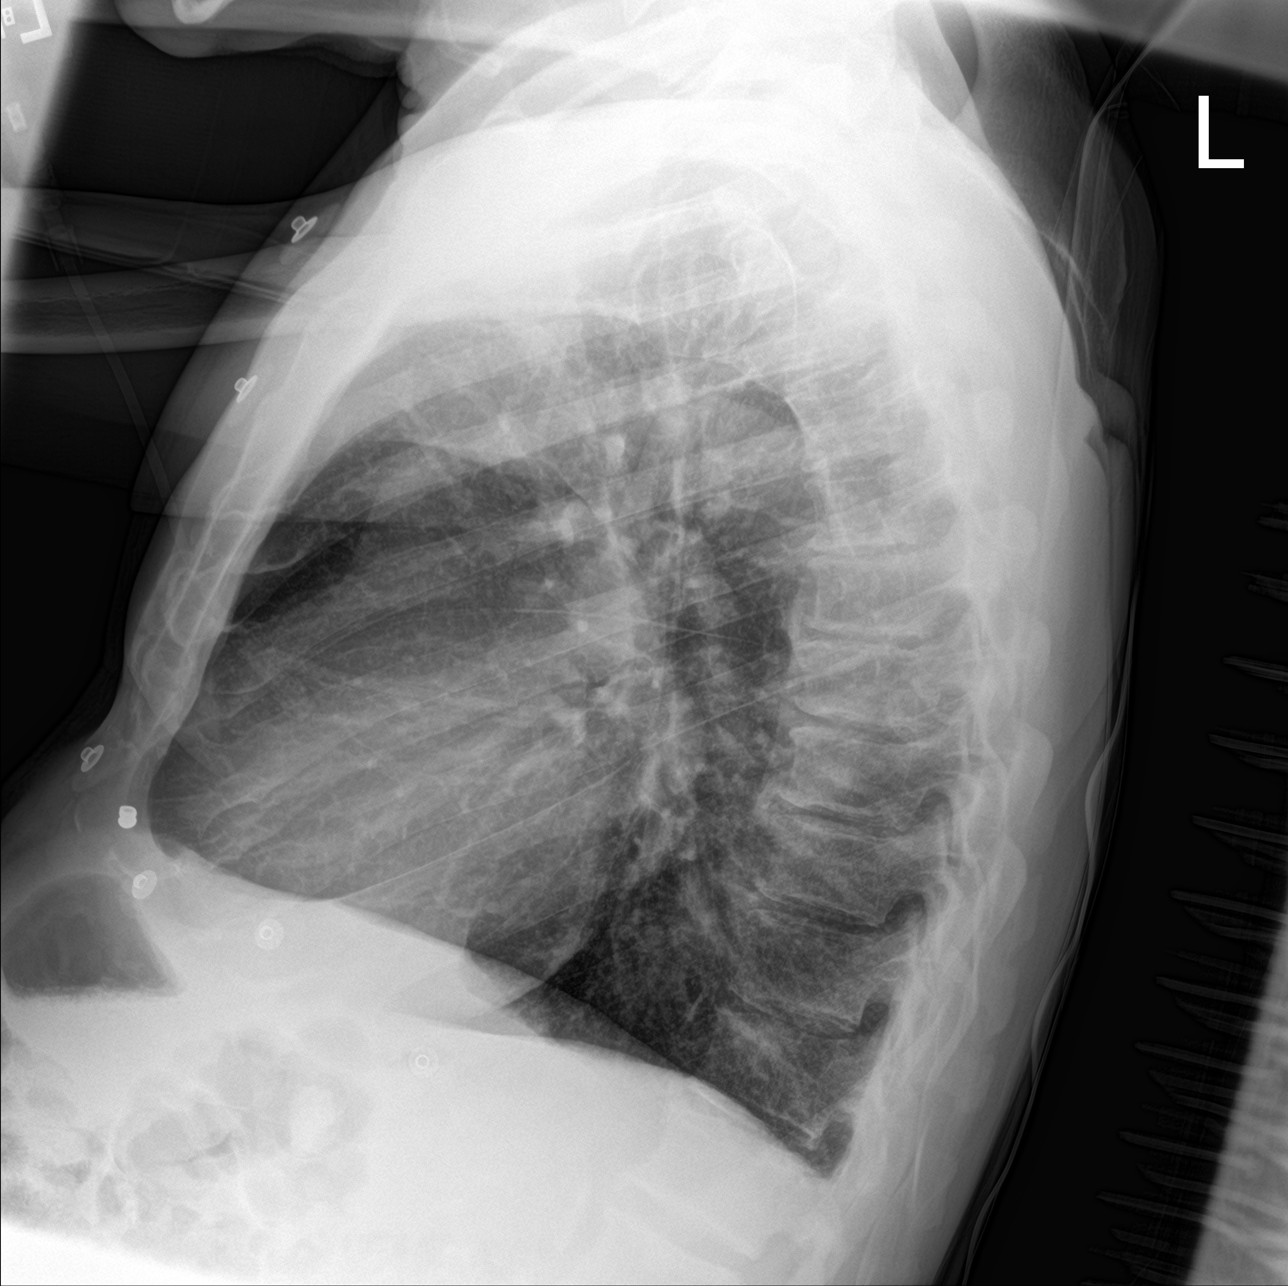

[Series 3: chest ap · 0.14mm/px · 2 of 2 slices shown]
[im 1/2]
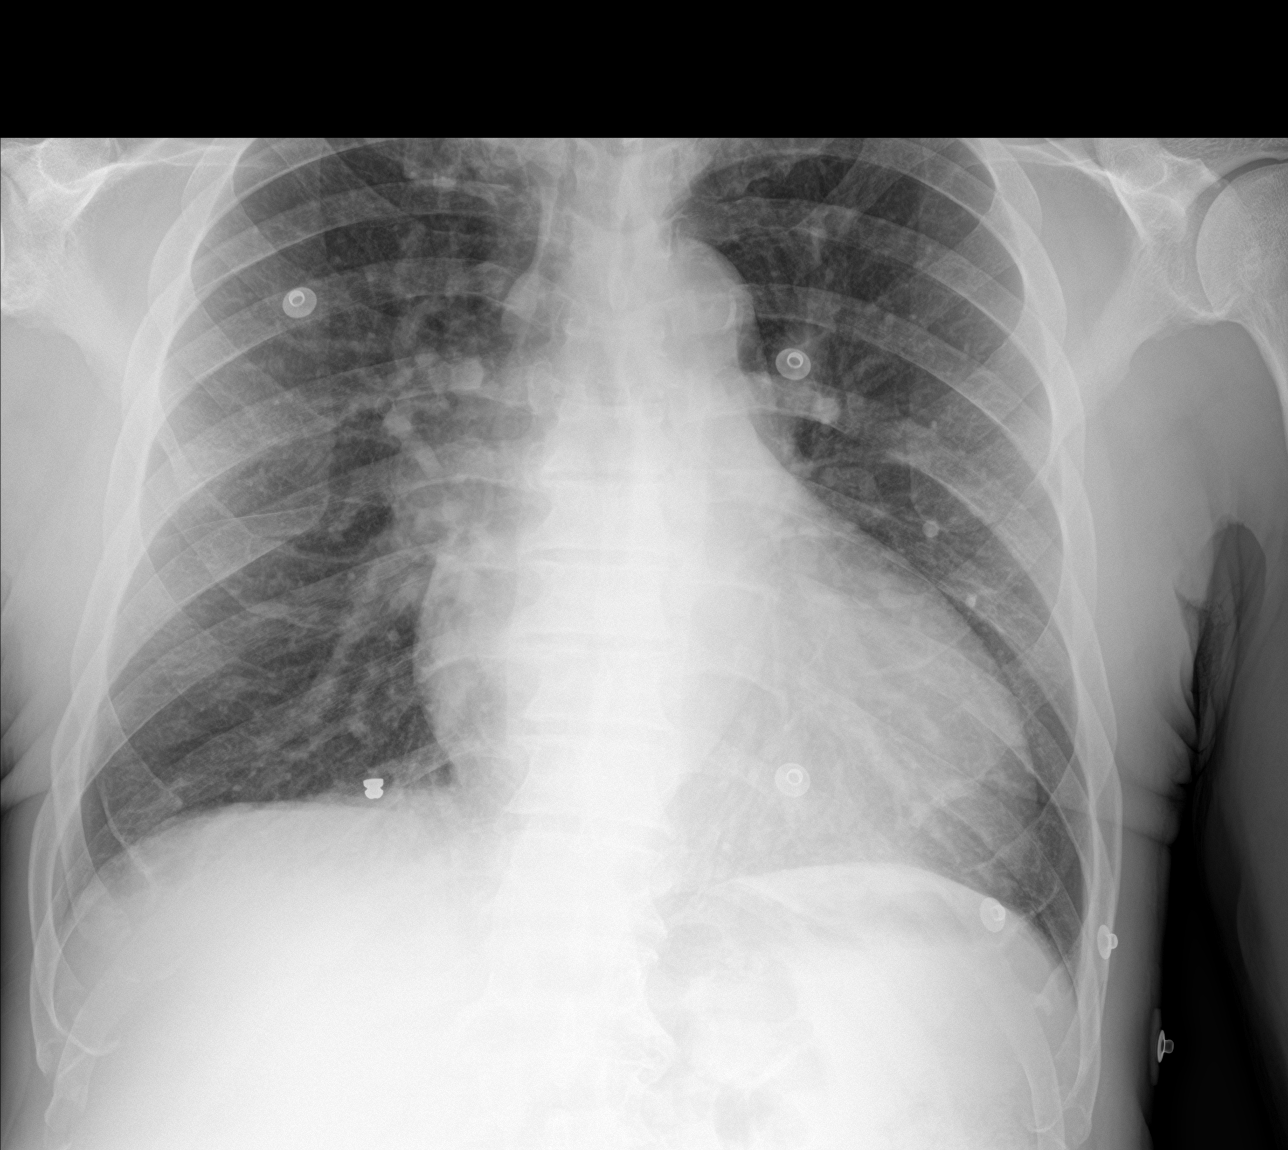
[im 2/2]
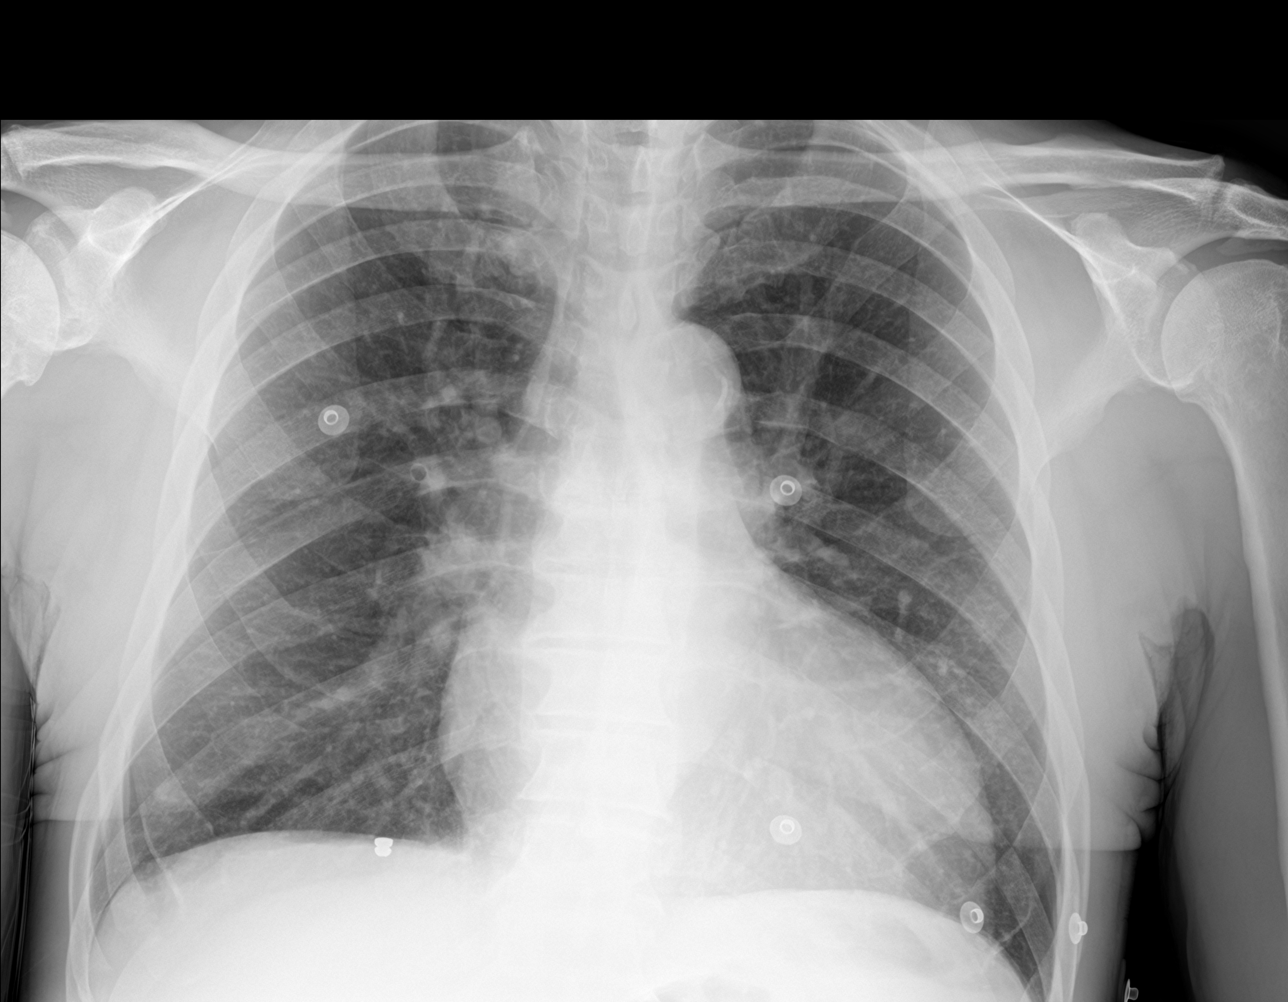

[3 of 3 positions shown; findings below may reference images not displayed]

FINDINGS: Cardiac enlargement. Pulmonary vascularity is normal. No airspace
disease or consolidation in the lungs. No blunting of costophrenic
angles. No pneumothorax. Mediastinal contours appear intact.
Calcification of the aorta. Degenerative changes in the spine.
IMPRESSION: Cardiac enlargement. No evidence of active pulmonary disease.

## 2018-08-07 ENCOUNTER — Encounter (HOSPITAL_COMMUNITY): Payer: Self-pay | Admitting: Emergency Medicine

## 2018-08-07 ENCOUNTER — Other Ambulatory Visit: Payer: Self-pay

## 2018-08-07 ENCOUNTER — Inpatient Hospital Stay (HOSPITAL_COMMUNITY)
Admission: EM | Admit: 2018-08-07 | Discharge: 2018-08-09 | DRG: 292 | Discharge status: Against Medical Advice | Payer: Medicare Other | Attending: Internal Medicine | Admitting: Internal Medicine

## 2018-08-07 ENCOUNTER — Emergency Department (HOSPITAL_COMMUNITY)
Admission: EM | Admit: 2018-08-07 | Discharge: 2018-08-07 | Disposition: A | Discharge status: Home / Self Care | Payer: Medicaid Other | Attending: Emergency Medicine | Admitting: Emergency Medicine

## 2018-08-07 ENCOUNTER — Emergency Department (HOSPITAL_COMMUNITY): Payer: Medicare Other

## 2018-08-07 DIAGNOSIS — F1420 Cocaine dependence, uncomplicated: Secondary | ICD-10-CM | POA: Diagnosis present

## 2018-08-07 DIAGNOSIS — F17200 Nicotine dependence, unspecified, uncomplicated: Secondary | ICD-10-CM | POA: Diagnosis present

## 2018-08-07 DIAGNOSIS — F17210 Nicotine dependence, cigarettes, uncomplicated: Secondary | ICD-10-CM | POA: Diagnosis not present

## 2018-08-07 DIAGNOSIS — F1910 Other psychoactive substance abuse, uncomplicated: Secondary | ICD-10-CM | POA: Insufficient documentation

## 2018-08-07 DIAGNOSIS — F19929 Other psychoactive substance use, unspecified with intoxication, unspecified: Secondary | ICD-10-CM | POA: Diagnosis present

## 2018-08-07 DIAGNOSIS — F1024 Alcohol dependence with alcohol-induced mood disorder: Secondary | ICD-10-CM | POA: Diagnosis present

## 2018-08-07 DIAGNOSIS — Z79899 Other long term (current) drug therapy: Secondary | ICD-10-CM

## 2018-08-07 DIAGNOSIS — I714 Abdominal aortic aneurysm, without rupture: Secondary | ICD-10-CM | POA: Diagnosis present

## 2018-08-07 DIAGNOSIS — I509 Heart failure, unspecified: Secondary | ICD-10-CM | POA: Diagnosis not present

## 2018-08-07 DIAGNOSIS — E119 Type 2 diabetes mellitus without complications: Secondary | ICD-10-CM | POA: Diagnosis not present

## 2018-08-07 DIAGNOSIS — B1920 Unspecified viral hepatitis C without hepatic coma: Secondary | ICD-10-CM | POA: Diagnosis present

## 2018-08-07 DIAGNOSIS — G8929 Other chronic pain: Secondary | ICD-10-CM | POA: Diagnosis present

## 2018-08-07 DIAGNOSIS — I10 Essential (primary) hypertension: Secondary | ICD-10-CM | POA: Diagnosis present

## 2018-08-07 DIAGNOSIS — R4589 Other symptoms and signs involving emotional state: Secondary | ICD-10-CM

## 2018-08-07 DIAGNOSIS — I110 Hypertensive heart disease with heart failure: Secondary | ICD-10-CM | POA: Insufficient documentation

## 2018-08-07 DIAGNOSIS — I700 Atherosclerosis of aorta: Secondary | ICD-10-CM | POA: Diagnosis present

## 2018-08-07 DIAGNOSIS — F419 Anxiety disorder, unspecified: Secondary | ICD-10-CM | POA: Diagnosis present

## 2018-08-07 DIAGNOSIS — F1999 Other psychoactive substance use, unspecified with unspecified psychoactive substance-induced disorder: Secondary | ICD-10-CM | POA: Diagnosis present

## 2018-08-07 DIAGNOSIS — I5043 Acute on chronic combined systolic (congestive) and diastolic (congestive) heart failure: Secondary | ICD-10-CM | POA: Diagnosis present

## 2018-08-07 DIAGNOSIS — F209 Schizophrenia, unspecified: Secondary | ICD-10-CM | POA: Diagnosis present

## 2018-08-07 DIAGNOSIS — Z9114 Patient's other noncompliance with medication regimen: Secondary | ICD-10-CM

## 2018-08-07 DIAGNOSIS — E1165 Type 2 diabetes mellitus with hyperglycemia: Secondary | ICD-10-CM | POA: Diagnosis present

## 2018-08-07 DIAGNOSIS — Z7151 Drug abuse counseling and surveillance of drug abuser: Secondary | ICD-10-CM

## 2018-08-07 DIAGNOSIS — R0902 Hypoxemia: Secondary | ICD-10-CM | POA: Diagnosis not present

## 2018-08-07 DIAGNOSIS — I5023 Acute on chronic systolic (congestive) heart failure: Secondary | ICD-10-CM

## 2018-08-07 DIAGNOSIS — Z5900 Homelessness unspecified: Secondary | ICD-10-CM

## 2018-08-07 DIAGNOSIS — Z888 Allergy status to other drugs, medicaments and biological substances status: Secondary | ICD-10-CM

## 2018-08-07 DIAGNOSIS — Z716 Tobacco abuse counseling: Secondary | ICD-10-CM

## 2018-08-07 DIAGNOSIS — Z7982 Long term (current) use of aspirin: Secondary | ICD-10-CM

## 2018-08-07 DIAGNOSIS — F1994 Other psychoactive substance use, unspecified with psychoactive substance-induced mood disorder: Secondary | ICD-10-CM | POA: Diagnosis present

## 2018-08-07 DIAGNOSIS — Z833 Family history of diabetes mellitus: Secondary | ICD-10-CM

## 2018-08-07 DIAGNOSIS — Z8249 Family history of ischemic heart disease and other diseases of the circulatory system: Secondary | ICD-10-CM

## 2018-08-07 DIAGNOSIS — G4730 Sleep apnea, unspecified: Secondary | ICD-10-CM | POA: Diagnosis present

## 2018-08-07 DIAGNOSIS — Z794 Long term (current) use of insulin: Secondary | ICD-10-CM

## 2018-08-07 DIAGNOSIS — E1140 Type 2 diabetes mellitus with diabetic neuropathy, unspecified: Secondary | ICD-10-CM | POA: Diagnosis present

## 2018-08-07 LAB — CBG MONITORING, ED: Glucose-Capillary: 234 mg/dL — ABNORMAL HIGH (ref 70–99)

## 2018-08-07 MED ORDER — SODIUM CHLORIDE 0.9% FLUSH: 3.0000 mL | Freq: Once | INTRAVENOUS | Status: DC

## 2018-08-07 NOTE — ED Notes (Signed)
Bed: WA27 Expected date:  Expected time:  Means of arrival:  Comments: EMS-SI 

## 2018-08-07 NOTE — ED Triage Notes (Signed)
Pt well known to ED>  Reports SOB since last night.  Denies pain.  Denies swelling.  Recent admissions for CHF.  States he is homeless and doesn't have any medications.  States he is unsure if he was given any when discharged.  Requesting admission to hospital.  Pt also reports he feels anxious because of the SOB and feeling like he is going to die.

## 2018-08-07 NOTE — Progress Notes (Addendum)
Consult request has been received. CSW attempting to follow up at present time.  CSW reviewed chart and CSW notes and sees pt has been at [facility] inpatient and [facility] ED, as well as [facility] ED in the past asking for either an FL-2 to be placed in a group home or to be placed in a group home by ED CSW's.  Pt has been told placement into a group home is not appropriate for the pt from the [facility] ED or [facility] inpatient and has been provided with a list og group homes and encouraged to call these group homes on his own.  CSW will see the pt to assess the pt's needs at this time.  CSW will continue to follow for D/C needs.  Dorothe Pea. Kielan Dreisbach, LCSW, LCAS, CSI Clinical Social Worker Ph: [phone]

## 2018-08-07 NOTE — ED Triage Notes (Signed)
Pt brought to hospital via EMS. Per EMS pt just discharged from jail and now endorsing SI. Pt informs this nurse he has had passive SI for 20 years, pt reports he is homeless and wants to stop abusing crack and be placed in a group home. Pt demanding food. Dinner tray provided.

## 2018-08-07 NOTE — ED Provider Notes (Signed)
[facility]   CSN: [id number] Arrival date & time: 08/07/18  1750     History   Chief Complaint Chief Complaint  Patient presents with  . Suicidal    HPI [name] is a 58 y.o. male.  HPI Patient presents stating he was suicidal.  States he is homeless and wants to stop using crack.  Has had frequent visits for this previously.  States he would be no longer suicidal if he could get into a group home.  States that he had been off his medicine because he was in jail.  States he was discharged from that around a year ago however.  No chest pain.  No trouble breathing.  Has been suicidal for years. Past Medical History:  Diagnosis Date  . CHF (congestive heart failure) (HCC)   . Chronic foot pain   . Cocaine abuse (HCC)   . Diabetes mellitus without complication (HCC)   . Hepatitis C   . Homelessness   . Hypertension   . Neuropathy   . Polysubstance abuse (HCC)   . Schizophrenia (HCC)   . Sleep apnea     Patient Active Problem List   Diagnosis Date Noted  . CHF (congestive heart failure) (HCC) 07/15/2018  . Acute exacerbation of CHF (congestive heart failure) (HCC) 03/28/2018  . Prostate enlargement 03/16/2018  . Aortic atherosclerosis (HCC) 03/16/2018  . 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 Chronic foot pain   . Schizoaffective disorder, bipolar type (HCC) 09/30/2016  . Substance induced mood disorder (HCC) 03/13/2015  . Acute kidney failure (HCC) 01/26/2015  . Schizophrenia, paranoid type (HCC) 01/17/2015  . Suicidal ideation   . Drug hallucinosis (HCC) 10/08/2014  . Chronic paranoid schizophrenia (HCC) 09/07/2014  . 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 Urinary retention   . Cocaine use disorder, severe, dependence (HCC)   . Essential hypertension, benign 03/28/2013  . Diabetes mellitus (HCC) 03/15/2013    Past  Surgical History:  Procedure Laterality Date  . MULTIPLE TOOTH EXTRACTIONS          Home Medications    Prior to Admission medications   Medication Sig Start Date End Date Taking? Authorizing Provider  Insulin Glargine (LANTUS) 100 UNIT/ML Solostar Pen Inject 20 Units into the skin daily. 08/03/18  Yes Pollina, Canary Brim, MD  Insulin Pen Needle 29G X MISC Use with insulin 08/03/18  Yes Pollina, Canary Brim, MD  acetaminophen (TYLENOL) 500 MG tablet Take 1 tablet (500 mg total) by mouth every 6 (six) hours as needed. Patient not taking: Reported on 08/07/2018 07/28/18   [name], PA-C  amLODipine (NORVASC) 10 MG tablet Take 1 tablet (10 mg total) by mouth daily. Patient not taking: Reported on 08/03/2018 08/03/18   Gilda Crease, MD  aspirin EC 81 MG tablet Take 1 tablet (81 mg total) by mouth daily. Patient not taking: Reported on 07/15/2018 03/30/18 03/30/19  Dorrell, Cathleen Corti, MD  atorvastatin (LIPITOR) 40 MG tablet Take 1 tablet (40 mg total) by mouth daily. Patient not taking: Reported on 08/03/2018 08/03/18   Gilda Crease, MD  furosemide (LASIX) 20 MG tablet Take 40 mg oral daily, and 20 mg every evening Patient not taking: Reported on 08/03/2018 08/03/18   Gilda Crease, MD  ibuprofen (ADVIL,MOTRIN) 400 MG tablet Take 1 tablet (400 mg total) by mouth every 6 (six) hours as needed. Patient not taking: Reported on 08/03/2018 07/24/18  Law, Alexandra M, PA-C  lisinopril (PRINIVIL,ZESTRIL) 5 MG tablet Take 1 tablet (5 mg total) by mouth daily. Patient not taking: Reported on 08/03/2018 08/03/18   Gilda Crease, MD    Family History Family History  Problem Relation Age of Onset  . Hypertension Other   . Diabetes Other     Social History Social History   Tobacco Use  . Smoking status: Current Every Day Smoker    Packs/day: 1.00    Years: 20.00    Pack years: 20.00    Types: Cigarettes  . Smokeless tobacco: Current User  Substance Use Topics  .  Alcohol use: Yes    Comment: Daily Drinker   . Drug use: Yes    Frequency: 7.0 times per week    Types: "Crack" cocaine, Cocaine, Marijuana    Comment: Cocaine tonight, Marijuana "a long time"     Allergies   Haldol [haloperidol]   Review of Systems Review of Systems  Constitutional: Negative for appetite change.  HENT: Negative for congestion.   Respiratory: Negative for shortness of breath.   Cardiovascular: Negative for chest pain.  Gastrointestinal: Negative for nausea.  Genitourinary: Negative for frequency.  Musculoskeletal: Negative for neck stiffness.  Neurological: Negative for weakness.     Physical Exam Updated Vital Signs BP (!) 153/93 (BP Location: Right Arm)   Pulse 100   Temp 97.6 F (36.4 C) (Oral)   Resp 20   Ht 5\' 9"  (1.753 m)   Wt 79.4 kg   SpO2 92%   BMI 25.84 kg/m   Physical Exam HENT:     Head:     Comments: Poor dentition    Mouth/Throat:     Mouth: Mucous membranes are moist.  Eyes:     Pupils: Pupils are equal, round, and reactive to light.  Neck:     Musculoskeletal: Neck supple.  Cardiovascular:     Rate and Rhythm: Regular rhythm.  Pulmonary:     Breath sounds: No rales.  Abdominal:     Tenderness: There is no abdominal tenderness.  Musculoskeletal:        General: No tenderness.  Skin:    General: Skin is warm.  Neurological:     General: No focal deficit present.     Mental Status: He is alert.      ED Treatments / Results  Labs (all labs ordered are listed, but only abnormal results are displayed) Labs Reviewed  CBG MONITORING, ED - Abnormal; Notable for the following components:      Result Value   Glucose-Capillary 234 (*)    All other components within normal limits    EKG None  Radiology No results found.  Procedures Procedures (including critical care time)  Medications Ordered in ED Medications - No data to display   Initial Impression / Assessment and Plan / ED Course  I have reviewed the  triage vital signs and the nursing notes.  Pertinent labs & imaging results that were available during my care of the patient were reviewed by me and considered in my medical decision making (see chart for details).     Patient requesting group home placement.  Discussed with social work and this cannot be done to the ER.  Appears passively suicidal although he does have substance abuse.  Reportedly has been this way for years.  Will discharge home to follow-up with resources as an outpatient.  Social work has had more discussion with patient.  The [facility] is open tonight.  Also  [facility] is aware of [facility].  Has been given resources.  Discharge home.  Final Clinical Impressions(s) / ED Diagnoses   Final diagnoses:  Substance abuse [facility](HCC)  Dysphoric mood    ED Discharge Orders    None       Benjiman CorePickering, Emillie Chasen, MD 08/07/18 1949

## 2018-08-07 NOTE — Progress Notes (Signed)
CSW met with pt who stated he was suicidal because he wanted to stay away from drugs.  CSW confirmed pt could go to [facility] at D/C where pt has been before.  Pt stated he wanted to and could go there on his own.    Pt balked at taking a bus to go to the [facility] where the "[employer]" was "up" signaling the warming station would be open all night.  Pt stated it was cold and this is why he didn't want to take a bus, so the CSW provided the pt with an x-large overstuffed coat and a toboggan, as well as the contact information for [facility], the [facility] and AA 12 step meetings.  Pt was given a sandwhich and sprite.  Pt stated he was agreeable to go to the [facility], was appreciative and thanked the CSW.  CSW also provided pt with the contact information for the [facility] and [facility] and counseled pt on how to seek an FL-2 from a PCP to seek admission to a group home and provided the pt with a list of group homes in [place] and pt stated he would call for openings.  Please reconsult if future social work needs arise.  CSW signing off, as social work intervention is no longer needed.  Alphonse Guild. Jamaira Sherk, LCSW, LCAS, CSI Clinical Social Worker Ph: [phone]

## 2018-08-08 ENCOUNTER — Encounter (HOSPITAL_COMMUNITY): Payer: Self-pay | Admitting: Internal Medicine

## 2018-08-08 ENCOUNTER — Other Ambulatory Visit: Payer: Self-pay

## 2018-08-08 DIAGNOSIS — I5043 Acute on chronic combined systolic (congestive) and diastolic (congestive) heart failure: Secondary | ICD-10-CM

## 2018-08-08 DIAGNOSIS — F17200 Nicotine dependence, unspecified, uncomplicated: Secondary | ICD-10-CM | POA: Diagnosis present

## 2018-08-08 DIAGNOSIS — Z5900 Homelessness unspecified: Secondary | ICD-10-CM

## 2018-08-08 LAB — CBC
HEMATOCRIT: 34.8 % — AB (ref 39.0–52.0)
HEMOGLOBIN: 10.6 g/dL — AB (ref 13.0–17.0)
MCH: 26.8 pg (ref 26.0–34.0)
MCHC: 30.5 g/dL (ref 30.0–36.0)
MCV: 87.9 fL (ref 80.0–100.0)
Platelets: 252 10*3/uL (ref 150–400)
RBC: 3.96 MIL/uL — ABNORMAL LOW (ref 4.22–5.81)
RDW: 17.5 % — ABNORMAL HIGH (ref 11.5–15.5)
WBC: 6.4 10*3/uL (ref 4.0–10.5)
nRBC: 0 % (ref 0.0–0.2)

## 2018-08-08 LAB — RAPID URINE DRUG SCREEN, HOSP PERFORMED
Amphetamines: NOT DETECTED
Barbiturates: NOT DETECTED
Benzodiazepines: NOT DETECTED
Cocaine: POSITIVE — AB
Opiates: NOT DETECTED
Tetrahydrocannabinol: NOT DETECTED

## 2018-08-08 LAB — BASIC METABOLIC PANEL
Anion gap: 6 (ref 5–15)
BUN: 16 mg/dL (ref 6–20)
CO2: 24 mmol/L (ref 22–32)
Calcium: 8.6 mg/dL — ABNORMAL LOW (ref 8.9–10.3)
Chloride: 108 mmol/L (ref 98–111)
Creatinine, Ser: 1.03 mg/dL (ref 0.61–1.24)
GFR calc Af Amer: >60 mL/min (ref >60)
GFR calc non Af Amer: >60 mL/min (ref >60)
Glucose, Bld: 214 mg/dL — ABNORMAL HIGH (ref 70–99)
POTASSIUM: 4.2 mmol/L (ref 3.5–5.1)
Sodium: 138 mmol/L (ref 135–145)

## 2018-08-08 LAB — I-STAT TROPONIN, ED: Troponin i, poc: 0.03 ng/mL (ref 0.00–0.08)

## 2018-08-08 LAB — GLUCOSE, CAPILLARY
Glucose-Capillary: 223 mg/dL — ABNORMAL HIGH (ref 70–99)
Glucose-Capillary: 148 mg/dL — ABNORMAL HIGH (ref 70–99)

## 2018-08-08 MED ORDER — ASPIRIN EC 81 MG PO TBEC
81.0000 mg | DELAYED_RELEASE_TABLET | Freq: Every day | ORAL | Status: DC
  Administered 2018-08-08 – 2018-08-09 (×2): 81 mg via ORAL
  Filled 2018-08-08 (×2): qty 1

## 2018-08-08 MED ORDER — FUROSEMIDE 10 MG/ML IJ SOLN
40.0000 mg | Freq: Two times a day (BID) | INTRAMUSCULAR | Status: DC
  Administered 2018-08-08 – 2018-08-09 (×2): 40 mg via INTRAVENOUS
  Filled 2018-08-08 (×2): qty 4

## 2018-08-08 MED ORDER — AMLODIPINE BESYLATE 10 MG PO TABS
10.0000 mg | ORAL_TABLET | Freq: Every day | ORAL | Status: DC
  Administered 2018-08-08 – 2018-08-09 (×2): 10 mg via ORAL
  Filled 2018-08-08 (×2): qty 1

## 2018-08-08 MED ORDER — POTASSIUM CHLORIDE CRYS ER 20 MEQ PO TBCR
20.0000 meq | EXTENDED_RELEASE_TABLET | Freq: Two times a day (BID) | ORAL | Status: DC
  Administered 2018-08-08 – 2018-08-09 (×2): 20 meq via ORAL
  Filled 2018-08-08 (×3): qty 1

## 2018-08-08 MED ORDER — SODIUM CHLORIDE 0.9% FLUSH: 3.0000 mL | INTRAVENOUS | Status: DC | PRN

## 2018-08-08 MED ORDER — IBUPROFEN 200 MG PO TABS: 400.0000 mg | ORAL_TABLET | Freq: Four times a day (QID) | ORAL | Status: DC | PRN

## 2018-08-08 MED ORDER — INSULIN GLARGINE 100 UNIT/ML ~~LOC~~ SOLN
20.0000 [IU] | Freq: Every day | SUBCUTANEOUS | Status: DC
  Administered 2018-08-08: 20 [IU] via SUBCUTANEOUS
  Filled 2018-08-08 (×3): qty 0.2

## 2018-08-08 MED ORDER — HYDRALAZINE HCL 20 MG/ML IJ SOLN: 5.0000 mg | INTRAMUSCULAR | Status: DC | PRN

## 2018-08-08 MED ORDER — INSULIN ASPART 100 UNIT/ML ~~LOC~~ SOLN: 0.0000 [IU] | Freq: Every day | SUBCUTANEOUS | Status: DC

## 2018-08-08 MED ORDER — ONDANSETRON HCL 4 MG/2ML IJ SOLN: 4.0000 mg | Freq: Three times a day (TID) | INTRAMUSCULAR | Status: DC | PRN

## 2018-08-08 MED ORDER — INSULIN ASPART 100 UNIT/ML ~~LOC~~ SOLN
0.0000 [IU] | Freq: Three times a day (TID) | SUBCUTANEOUS | Status: DC
  Administered 2018-08-08: 3 [IU] via SUBCUTANEOUS
  Administered 2018-08-09: 1 [IU] via SUBCUTANEOUS
  Administered 2018-08-09: 5 [IU] via SUBCUTANEOUS

## 2018-08-08 MED ORDER — SODIUM CHLORIDE 0.9 % IV SOLN: 250.0000 mL | INTRAVENOUS | Status: DC | PRN

## 2018-08-08 MED ORDER — ALBUTEROL SULFATE (2.5 MG/3ML) 0.083% IN NEBU: 2.5000 mg | INHALATION_SOLUTION | RESPIRATORY_TRACT | Status: DC | PRN

## 2018-08-08 MED ORDER — DM-GUAIFENESIN ER 30-600 MG PO TB12: 1.0000 | ORAL_TABLET | Freq: Two times a day (BID) | ORAL | Status: DC | PRN

## 2018-08-08 MED ORDER — SODIUM CHLORIDE 0.9% FLUSH
3.0000 mL | Freq: Two times a day (BID) | INTRAVENOUS | Status: DC
  Administered 2018-08-08 – 2018-08-09 (×2): 3 mL via INTRAVENOUS

## 2018-08-08 MED ORDER — LISINOPRIL 5 MG PO TABS
5.0000 mg | ORAL_TABLET | Freq: Every day | ORAL | Status: DC
  Administered 2018-08-08 – 2018-08-09 (×2): 5 mg via ORAL
  Filled 2018-08-08 (×2): qty 1

## 2018-08-08 MED ORDER — ATORVASTATIN CALCIUM 40 MG PO TABS
40.0000 mg | ORAL_TABLET | Freq: Every day | ORAL | Status: DC
  Administered 2018-08-08 – 2018-08-09 (×2): 40 mg via ORAL
  Filled 2018-08-08 (×2): qty 1

## 2018-08-08 MED ORDER — ACETAMINOPHEN 325 MG PO TABS
650.0000 mg | ORAL_TABLET | Freq: Four times a day (QID) | ORAL | Status: DC | PRN
  Administered 2018-08-08 – 2018-08-09 (×2): 650 mg via ORAL
  Filled 2018-08-08 (×2): qty 2

## 2018-08-08 MED ORDER — FUROSEMIDE 10 MG/ML IJ SOLN
40.0000 mg | Freq: Once | INTRAMUSCULAR | Status: AC
  Administered 2018-08-08: 40 mg via INTRAVENOUS
  Filled 2018-08-08: qty 4

## 2018-08-08 MED ORDER — ACETAMINOPHEN 325 MG PO TABS: 650.0000 mg | ORAL_TABLET | ORAL | Status: DC | PRN

## 2018-08-08 MED ORDER — ONDANSETRON HCL 4 MG/2ML IJ SOLN: 4.0000 mg | Freq: Four times a day (QID) | INTRAMUSCULAR | Status: DC | PRN

## 2018-08-08 MED ORDER — ENOXAPARIN SODIUM 40 MG/0.4ML ~~LOC~~ SOLN
40.0000 mg | SUBCUTANEOUS | Status: DC
  Administered 2018-08-08: 40 mg via SUBCUTANEOUS
  Filled 2018-08-08 (×2): qty 0.4

## 2018-08-08 NOTE — Progress Notes (Signed)
Patient states he would like to talk to someone about assistance in getting a cane. Will place case manager eval.

## 2018-08-08 NOTE — Care Management (Signed)
   This is a no charge note  Pending admission per PA, Dahlia Client  58 year old man with a past medical history of CHF with EF of 30%, hypertension, diabetes mellitus, polysubstance abuse including cocaine, OSA, schizophrenia, homeless, who presents with shortness of breath. Chest x-ray showed cardiomegaly and interstitial edema, clinically consistent with CHF exacerbation.  Patient was given 1 dose of Lasix 40 mg x 1.  Patient is placed on telemetry bed for observation.   Lorretta Harp, MD  [facility]   If 7PM-7AM, please contact night-coverage [email or web address] Password [email or web address] 08/08/2018, 4:15 AM

## 2018-08-08 NOTE — H&P (Signed)
History and Physical    [name] DOB: 1960/07/21 DOA: 08/07/2018  PCP: Lavinia SharpsPlacey, [name], NP  Patient coming from: Street.  I have personally briefly reviewed patient's old medical records available.   Chief Complaint: "I am shaky and weak, cold."  HPI: [name] is a 58 y.o. male with medical history significant of systolic heart failure with ejection fraction 35%, homelessness, crack cocaine use, noncompliance, hypertension, schizophrenia, recent admission in left AGAINST MEDICAL ADVICE coming to the emergency room with multiple complaints including shortness of breath, nowhere to live, feeling cold and weak.  According to the patient, he left hospital 1 week ago AGAINST MEDICAL ADVICE, he says that he never felt better.  Nobody is taking care of him.  Is progressively worsening shortness of breath and he cannot ambulate around.  He is not taking his medications.  He complains of worsening shortness of breath, dry cough and leg swelling. No fever or chills.  Denies any sputum production.  Denies any headache, nausea, vomiting, chest pain, abdominal pain.  Denies any diarrhea.  Denies any constipation. ED Course: Hemodynamically stable in the ER.  Patient on room air.  proBNP is 900.  Electrolytes are normal.  Blood sugars are 214.  Chest x-ray shows chronic cardiomegaly and prominent interstitial fluid.  Troponins normal.  Given 40 mg Lasix in the ER.  Review of Systems: As per HPI otherwise 10 point review of systems negative.    Past Medical History:  Diagnosis Date  . CHF (congestive heart failure) (HCC)   . Chronic foot pain   . Cocaine abuse (HCC)   . Diabetes mellitus without complication (HCC)   . Hepatitis C   . Homelessness   . Hypertension   . Neuropathy   . Polysubstance abuse (HCC)   . Schizophrenia (HCC)   . Sleep apnea     Past Surgical History:  Procedure Laterality Date  . MULTIPLE TOOTH EXTRACTIONS       reports that he has  been smoking cigarettes. He has a 20.00 pack-year smoking history. He uses smokeless tobacco. He reports current alcohol use. He reports current drug use. Frequency: 7.00 times per week. Drugs: "Crack" cocaine, Cocaine, and Marijuana.  Allergies  Allergen Reactions  . Haldol [Haloperidol] Other (See Comments)    Muscle spasms, loss of voluntary movement. However, pt has taken Thorazine on multiple occasions with no adverse effects.     Family History  Problem Relation Age of Onset  . Hypertension Other   . Diabetes Other      Prior to Admission medications   Medication Sig Start Date End Date Taking? Authorizing Provider  Insulin Glargine (LANTUS) 100 UNIT/ML Solostar Pen Inject 20 Units into the skin daily. 08/03/18  Yes Pollina, Canary Brimhristopher J, MD  amLODipine (NORVASC) 10 MG tablet Take 1 tablet (10 mg total) by mouth daily. Patient not taking: Reported on 08/03/2018 08/03/18   Gilda CreasePollina, Christopher J, MD  aspirin EC 81 MG tablet Take 1 tablet (81 mg total) by mouth daily. Patient not taking: Reported on 07/15/2018 03/30/18 03/30/19  Dorrell, Cathleen Cortieborah N, MD  atorvastatin (LIPITOR) 40 MG tablet Take 1 tablet (40 mg total) by mouth daily. Patient not taking: Reported on 08/03/2018 08/03/18   Gilda CreasePollina, Christopher J, MD  furosemide (LASIX) 20 MG tablet Take 40 mg oral daily, and 20 mg every evening Patient not taking: Reported on 08/03/2018 08/03/18   Gilda CreasePollina, Christopher J, MD  ibuprofen (ADVIL,MOTRIN) 400 MG tablet Take 1 tablet (400 mg total)  by mouth every 6 (six) hours as needed. Patient not taking: Reported on 08/03/2018 07/24/18   Emi Holes, PA-C  Insulin Pen Needle 29G X MISC Use with insulin 08/03/18   Pollina, Canary Brim, MD  lisinopril (PRINIVIL,ZESTRIL) 5 MG tablet Take 1 tablet (5 mg total) by mouth daily. Patient not taking: Reported on 08/03/2018 08/03/18   Gilda Crease, MD    Physical Exam: Vitals:   08/07/18 2319 08/08/18 0203 08/08/18 0330 08/08/18 0556  BP:  (!)  166/106 (!) 149/91 (!) 166/104  Pulse:  96 95 90  Resp:  (!) 22 (!) 22 17  Temp:    98.2 F (36.8 C)  TempSrc:    Oral  SpO2: 94% 94% 99% 100%    Constitutional: NAD, calm, comfortable Vitals:   08/07/18 2319 08/08/18 0203 08/08/18 0330 08/08/18 0556  BP:  (!) 166/106 (!) 149/91 (!) 166/104  Pulse:  96 95 90  Resp:  (!) 22 (!) 22 17  Temp:    98.2 F (36.8 C)  TempSrc:    Oral  SpO2: 94% 94% 99% 100%   Eyes: PERRL, lids and conjunctivae normal ENMT: Mucous membranes are dry.  Poor dentition.  Floor the mouth with food in the mouth.    Neck: normal, supple, no masses, no thyromegaly Respiratory: Occasional expiratory crackles at bases.  no wheezing, Normal respiratory effort. No accessory muscle use.  Cardiovascular: Regular rate and rhythm, no murmurs / rubs / gallops. No extremity edema. 2+ pedal pulses. No carotid bruits.  Abdomen: no tenderness, no masses palpated. No hepatosplenomegaly. Bowel sounds positive.  Musculoskeletal: no clubbing / cyanosis. No joint deformity upper and lower extremities. Good ROM, no contractures. Normal muscle tone.  Skin: no rashes, lesions, ulcers. No induration Neurologic: CN 2-12 grossly intact. Sensation intact, DTR normal. Strength 5/5 in all 4.  Psychiatric: Normal judgment and insight. Alert and oriented x 3.  Irritable.  Agitated.    Labs on Admission: I have personally reviewed following labs and imaging studies  CBC: Recent Labs  Lab 08/03/18 0524 08/07/18 2326  WBC 8.9 6.4  NEUTROABS 6.4  --   HGB 11.7* 10.6*  HCT 37.7* 34.8*  MCV 86.5 87.9  PLT 266 252   Basic Metabolic Panel: Recent Labs  Lab 08/03/18 0524 08/07/18 2326  NA 143 138  K 3.5 4.2  CL 107 108  CO2 23 24  GLUCOSE 51* 214*  BUN 22* 16  CREATININE 0.97 1.03  CALCIUM 9.0 8.6*   GFR: Estimated Creatinine Clearance: 79.1 mL/min (by C-G formula based on SCr of 1.03 mg/dL). Liver Function Tests: No results for input(s): AST, ALT, ALKPHOS, BILITOT, PROT,  ALBUMIN in the last 168 hours. No results for input(s): LIPASE, AMYLASE in the last 168 hours. No results for input(s): AMMONIA in the last 168 hours. Coagulation Profile: No results for input(s): INR, PROTIME in the last 168 hours. Cardiac Enzymes: Recent Labs  Lab 08/03/18 0524  TROPONINI 0.05*   BNP (last 3 results) No results for input(s): PROBNP in the last 8760 hours. HbA1C: No results for input(s): HGBA1C in the last 72 hours. CBG: Recent Labs  Lab 08/07/18 1929  GLUCAP 234*   Lipid Profile: No results for input(s): CHOL, HDL, LDLCALC, TRIG, CHOLHDL, LDLDIRECT in the last 72 hours. Thyroid Function Tests: No results for input(s): TSH, T4TOTAL, FREET4, T3FREE, THYROIDAB in the last 72 hours. Anemia Panel: No results for input(s): VITAMINB12, FOLATE, FERRITIN, TIBC, IRON, RETICCTPCT in the last 72 hours. Urine analysis:  Component Value Date/Time   COLORURINE YELLOW 07/30/2018 0430   APPEARANCEUR CLEAR 07/30/2018 0430   LABSPEC 1.012 07/30/2018 0430   PHURINE 6.0 07/30/2018 0430   GLUCOSEU 150 (A) 07/30/2018 0430   HGBUR NEGATIVE 07/30/2018 0430   BILIRUBINUR NEGATIVE 07/30/2018 0430   KETONESUR NEGATIVE 07/30/2018 0430   PROTEINUR 30 (A) 07/30/2018 0430   UROBILINOGEN 1.0 03/14/2015 0610   NITRITE NEGATIVE 07/30/2018 0430   LEUKOCYTESUR NEGATIVE 07/30/2018 0430    Radiological Exams on Admission: Dg Chest 2 View  Result Date: 08/08/2018 CLINICAL DATA:  Initial evaluation for acute shortness of breath EXAM: CHEST - 2 VIEW COMPARISON:  Prior radiograph from 08/03/2018 FINDINGS: Cardiomegaly, stable from previous. Mediastinal silhouette within normal limits. Aortic atherosclerosis. Lungs normally inflated. Diffuse vascular and interstitial congestion, suggesting mild diffuse pulmonary interstitial edema. Small bilateral pleural effusions. Left perihilar atelectasis and/or scarring, stable. No other focal infiltrates. No pneumothorax. No acute osseous finding.  IMPRESSION: 1. Cardiomegaly with mild diffuse pulmonary interstitial edema and small bilateral pleural effusions, suggesting acute CHF. 2. Superimposed left perihilar atelectasis/scarring. 3. Aortic atherosclerosis. Electronically Signed   By: [name]   On: 08/08/2018 00:03    EKG: Independently reviewed.  Normal sinus rhythm.  T wave inversions on lateral leads that is present on previous EKGs.  No other ST-T wave changes.  Assessment/Plan Principal Problem:   Acute on chronic combined systolic (congestive) and diastolic (congestive) heart failure (HCC) Active Problems:   Diabetes mellitus (HCC)   Essential hypertension, benign   Cocaine use disorder, severe, dependence (HCC)   Substance or medication-induced bipolar and related disorder with onset during intoxication (HCC)   Homelessness   Smoker     1.  Acute on chronic combined systolic and diastolic heart failure: Severe noncompliance and ongoing cocaine use.  Not using and diuretics at home.  Patient will need monitoring and admission in the hospital to optimize his volume status, however doubtful compliance after discharge. Admit to telemetry.  Lasix 40 mg IV twice a day.  Continue beta-blockers and lisinopril.  Known ejection fraction 35%, no evidence of active chest pain or coronary syndrome, will not repeat echocardiogram as this will not change any management.  Continue aspirin and statin. Case management consult, may need medication assistance on discharge to homeless shelter.  2.  Type 2 diabetes: Uncontrolled because of not using medications.  Resume insulin and monitor.  3.  Hypertension: Blood pressures are fairly stable.  Will resume beta-blockers as centimeters and amlodipine and monitor.  4.  Substance abuse: Cocaine use Discussed extensively, however patient is not receptive.  We will continue to counsel.  5.  Smoker: Counseled to quit.  Patient was not receptive and quite irritable on discussion.   We will continue efforts to counsel.   DVT prophylaxis: Lovenox. Code Status: Full code. Family Communication: No family at bedside. Disposition Plan: Homeless shelter. Consults called: None. Admission status: Observation.   Dorcas Carrow MD [facility] Pager [phone]  If 7PM-7AM, please contact night-coverage [email or web address] Password [email or web address]  08/08/2018, 8:04 AM

## 2018-08-08 NOTE — Plan of Care (Signed)
  Problem: Education: Goal: Knowledge of General Education information will improve Description: Including pain rating scale, medication(s)/side effects and non-pharmacologic comfort measures Outcome: Progressing   Problem: Health Behavior/Discharge Planning: Goal: Ability to manage health-related needs will improve Outcome: Progressing   Problem: Clinical Measurements: Goal: Respiratory complications will improve Outcome: Progressing   

## 2018-08-08 NOTE — ED Notes (Signed)
Tabitha (SR)Lunch Tray Ordered @ 0956-per RN-called by Brianna Esson  

## 2018-08-08 NOTE — ED Notes (Signed)
Pt. Becoming increasingly upset because he feels the staff is ignoring him. Said he would calm down if he could have a snack. According to his RN, Traypaniel, he cannot.

## 2018-08-08 NOTE — ED Provider Notes (Signed)
[facility] EMERGENCY DEPARTMENT Provider Note   CSN: [id number] Arrival date & time: 08/07/18  2312     History   Chief Complaint Chief Complaint  Patient presents with  . Shortness of Breath    HPI [name] is a 58 y.o. male with a hx of CHF, chronic pain, cocaine abuse, diabetes, was hypertension, schizophrenia presents to the Emergency Department complaining of gradual, persistent, progressively worsening shortness of breath onset several days ago.  Record review shows that patient was admitted July 30, 2018 for CHF exacerbation.  He subsequently signed out AMA on August 01, 2018.  Patient reports he has not had any of his medication since that time.  He reports worsening shortness of breath, cough, leg swelling.  He denies fever, chills, headache, neck pain, chest pain, abdominal pain, vomiting, diarrhea, weakness, dizziness, syncope.  Patient reports that exertion makes her shortness of breath worse.  Rest does help it some.  She reports that he is homeless and has been regularly using cocaine over the last several days.  He has a history of cocaine abuse.   The history is provided by the patient and medical records. No language interpreter was used.    Past Medical History:  Diagnosis Date  . CHF (congestive heart failure) (HCC)   . Chronic foot pain   . Cocaine abuse (HCC)   . Diabetes mellitus without complication (HCC)   . Hepatitis C   . Homelessness   . Hypertension   . Neuropathy   . Polysubstance abuse (HCC)   . Schizophrenia (HCC)   . Sleep apnea     Patient Active Problem List   Diagnosis Date Noted  . 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 CHF (congestive heart failure) (HCC) 07/15/2018  . Acute exacerbation of CHF (congestive heart failure) (HCC) 03/28/2018  . Prostate enlargement 03/16/2018  . Aortic atherosclerosis (HCC) 03/16/2018  . Aneurysm of  abdominal aorta (HCC) 03/16/2018  . Chronic foot pain   . Schizoaffective disorder, bipolar type (HCC) 09/30/2016  . Substance induced mood disorder (HCC) 03/13/2015  . Acute kidney failure (HCC) 01/26/2015  . Schizophrenia, paranoid type (HCC) 01/17/2015  . Suicidal ideation   . Drug hallucinosis (HCC) 10/08/2014  . Chronic paranoid schizophrenia (HCC) 09/07/2014  . 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 Urinary retention   . Cocaine use disorder, severe, dependence (HCC)   . Essential hypertension, benign 03/28/2013  . Diabetes mellitus (HCC) 03/15/2013    Past Surgical History:  Procedure Laterality Date  . MULTIPLE TOOTH EXTRACTIONS          Home Medications    Prior to Admission medications   Medication Sig Start Date End Date Taking? Authorizing Provider  Insulin Glargine (LANTUS) 100 UNIT/ML Solostar Pen Inject 20 Units into the skin daily. 08/03/18  Yes Pollina, Canary Brim, MD  acetaminophen (TYLENOL) 500 MG tablet Take 1 tablet (500 mg total) by mouth every 6 (six) hours as needed. Patient not taking: Reported on 08/07/2018 07/28/18   [name], PA-C  amLODipine (NORVASC) 10 MG tablet Take 1 tablet (10 mg total) by mouth daily. Patient not taking: Reported on 08/03/2018 08/03/18   Gilda Crease, MD  aspirin EC 81 MG tablet Take 1 tablet (81 mg total) by mouth daily. Patient not taking: Reported on 07/15/2018 03/30/18 03/30/19  Dorrell, Cathleen Corti, MD  atorvastatin (LIPITOR) 40 MG tablet Take 1  tablet (40 mg total) by mouth daily. Patient not taking: Reported on 08/03/2018 08/03/18   Gilda CreasePollina, Christopher J, MD  furosemide (LASIX) 20 MG tablet Take 40 mg oral daily, and 20 mg every evening Patient not taking: Reported on 08/03/2018 08/03/18   Gilda CreasePollina, Christopher J, MD  ibuprofen (ADVIL,MOTRIN) 400 MG tablet Take 1 tablet (400 mg total) by mouth every 6 (six) hours as needed. Patient not taking: Reported  on 08/03/2018 07/24/18   Emi HolesLaw, Alexandra M, PA-C  Insulin Pen Needle 29G X 8MM MISC Use with insulin 08/03/18   Pollina, Canary Brimhristopher J, MD  lisinopril (PRINIVIL,ZESTRIL) 5 MG tablet Take 1 tablet (5 mg total) by mouth daily. Patient not taking: Reported on 08/03/2018 08/03/18   Gilda CreasePollina, Christopher J, MD    Family History Family History  Problem Relation Age of Onset  . Hypertension Other   . Diabetes Other     Social History Social History   Tobacco Use  . Smoking status: Current Every Day Smoker    Packs/day: 1.00    Years: 20.00    Pack years: 20.00    Types: Cigarettes  . Smokeless tobacco: Current User  Substance Use Topics  . Alcohol use: Yes    Comment: Daily Drinker   . Drug use: Yes    Frequency: 7.0 times per week    Types: "Crack" cocaine, Cocaine, Marijuana    Comment: Cocaine tonight, Marijuana "a long time"     Allergies   Haldol [haloperidol]   Review of Systems Review of Systems  Constitutional: Negative for appetite change, diaphoresis, fatigue, fever and unexpected weight change.  HENT: Negative for mouth sores.   Eyes: Negative for visual disturbance.  Respiratory: Positive for shortness of breath. Negative for cough, chest tightness and wheezing.   Cardiovascular: Positive for leg swelling. Negative for chest pain.  Gastrointestinal: Negative for abdominal pain, constipation, diarrhea, nausea and vomiting.  Endocrine: Negative for polydipsia, polyphagia and polyuria.  Genitourinary: Negative for dysuria, frequency, hematuria and urgency.  Musculoskeletal: Negative for back pain and neck stiffness.  Skin: Negative for rash.  Allergic/Immunologic: Negative for immunocompromised state.  Neurological: Negative for syncope, light-headedness and headaches.  Hematological: Does not bruise/bleed easily.  Psychiatric/Behavioral: Negative for sleep disturbance. The patient is not nervous/anxious.      Physical Exam Updated Vital Signs BP (!) 166/106 (BP  Location: Right Arm)   Pulse 96   Temp (!) 97.4 F (36.3 C) (Oral)   Resp (!) 22   SpO2 94%   Physical Exam Vitals signs and nursing note reviewed.  Constitutional:      General: He is not in acute distress.    Appearance: He is well-developed. He is not diaphoretic.     Comments: Awake, alert, nontoxic appearance  HENT:     Head: Normocephalic and atraumatic.     Mouth/Throat:     Pharynx: No oropharyngeal exudate.  Eyes:     General: No scleral icterus.    Conjunctiva/sclera: Conjunctivae normal.  Neck:     Musculoskeletal: Normal range of motion and neck supple.  Cardiovascular:     Rate and Rhythm: Normal rate and regular rhythm.  Pulmonary:     Effort: Tachypnea and accessory muscle usage ( mild) present. No respiratory distress.     Breath sounds: Examination of the right-lower field reveals rales. Examination of the left-lower field reveals rales. Rales present. No wheezing.  Chest:     Chest wall: No tenderness.  Abdominal:     General: Bowel sounds are normal.  Palpations: Abdomen is soft. There is no mass.     Tenderness: There is no abdominal tenderness. There is no guarding or rebound.  Musculoskeletal: Normal range of motion.     Right lower leg: Edema present.     Left lower leg: Edema present.     Comments: 2+ pitting edema of the bilateral lower extremities.  Skin:    General: Skin is warm and dry.  Neurological:     Mental Status: He is alert.     Comments: Speech is clear and goal oriented Moves extremities without ataxia      ED Treatments / Results  Labs (all labs ordered are listed, but only abnormal results are displayed) Labs Reviewed  BASIC METABOLIC PANEL - Abnormal; Notable for the following components:      Result Value   Glucose, Bld 214 (*)    Calcium 8.6 (*)    All other components within normal limits  CBC - Abnormal; Notable for the following components:   RBC 3.96 (*)    Hemoglobin 10.6 (*)    HCT 34.8 (*)    RDW 17.5 (*)     All other components within normal limits  RAPID URINE DRUG SCREEN, HOSP PERFORMED - Abnormal; Notable for the following components:   Cocaine POSITIVE (*)    All other components within normal limits  BRAIN NATRIURETIC PEPTIDE  I-STAT TROPONIN, ED    EKG EKG Interpretation  Date/Time:  Friday August 07 2018 23:25:09 EST Ventricular Rate:  95 PR Interval:  130 QRS Duration: 82 QT Interval:  370 QTC Calculation: 464 R Axis:   11 Text Interpretation:  Normal sinus rhythm Possible Left atrial enlargement Septal infarct , age undetermined T wave abnormality, consider lateral ischemia Abnormal ECG No significant change since last tracing Confirmed by Rochele RaringWard, Kristen [phone]([id number]) on 08/08/2018 6:57:28 AM     Radiology Dg Chest 2 View  Result Date: 08/08/2018 CLINICAL DATA:  Initial evaluation for acute shortness of breath EXAM: CHEST - 2 VIEW COMPARISON:  Prior radiograph from 08/03/2018 FINDINGS: Cardiomegaly, stable from previous. Mediastinal silhouette within normal limits. Aortic atherosclerosis. Lungs normally inflated. Diffuse vascular and interstitial congestion, suggesting mild diffuse pulmonary interstitial edema. Small bilateral pleural effusions. Left perihilar atelectasis and/or scarring, stable. No other focal infiltrates. No pneumothorax. No acute osseous finding. IMPRESSION: 1. Cardiomegaly with mild diffuse pulmonary interstitial edema and small bilateral pleural effusions, suggesting acute CHF. 2. Superimposed left perihilar atelectasis/scarring. 3. Aortic atherosclerosis. Electronically Signed   By: Rise MuBenjamin  McClintock M.D.   On: 08/08/2018 00:03    Procedures Procedures (including critical care time)  Medications Ordered in ED Medications  sodium chloride flush (NS) 0.9 % injection 3 mL (has no administration in time range)  acetaminophen (TYLENOL) tablet 650 mg (has no administration in time range)  hydrALAZINE (APRESOLINE) injection 5 mg (has no administration in time  range)  ondansetron (ZOFRAN) injection 4 mg (has no administration in time range)  albuterol (PROVENTIL) (2.5 MG/3ML) 0.083% nebulizer solution 2.5 mg (has no administration in time range)  dextromethorphan-guaiFENesin (MUCINEX DM) 30-600 MG per 12 hr tablet 1 tablet (has no administration in time range)  furosemide (LASIX) injection 40 mg (40 mg Intravenous Given 08/08/18 0332)     Initial Impression / Assessment and Plan / ED Course  I have reviewed the triage vital signs and the nursing notes.  Pertinent labs & imaging results that were available during my care of the patient were reviewed by me and considered in my medical decision making (see  chart for details).  Clinical Course as of Aug 08 698  Sat Aug 08, 2018  0981 Discussed with Dr. Clyde Lundborg who will admit   [HM]    Clinical Course User Index [HM] Bookert Guzzi, Boyd Kerbs    Presents to the emergency department with complaints of shortness of breath.  He has bilateral lower extremity pitting edema, Rales in the lower lobes, tachypnea and mild sensory muscle usage.  He is hypoxic on room air at 87%.  He is requiring 3 L via nasal cannula to maintain oxygen saturation above 80%.  Suspect patient has recurrent CHF exacerbation secondary to noncompliance with his medications and continued use of cocaine.  Patient given Lasix here in the emergency department.  He will need admission.  The patient was discussed with Dr. Elesa Massed who agrees with the treatment plan.   Final Clinical Impressions(s) / ED Diagnoses   Final diagnoses:  Acute on chronic systolic congestive heart failure [facility])  Hypoxia    ED Discharge Orders    None       Emiliana Blaize, Dahlia Client, PA-C 08/08/18 0700    Ward, Layla Maw, DO 08/08/18 0720

## 2018-08-08 NOTE — ED Notes (Signed)
Pt requested sprite and pt given the same

## 2018-08-09 DIAGNOSIS — Z794 Long term (current) use of insulin: Secondary | ICD-10-CM | POA: Diagnosis not present

## 2018-08-09 DIAGNOSIS — R0902 Hypoxemia: Secondary | ICD-10-CM | POA: Diagnosis present

## 2018-08-09 DIAGNOSIS — Z8249 Family history of ischemic heart disease and other diseases of the circulatory system: Secondary | ICD-10-CM | POA: Diagnosis not present

## 2018-08-09 DIAGNOSIS — I5043 Acute on chronic combined systolic (congestive) and diastolic (congestive) heart failure: Secondary | ICD-10-CM | POA: Diagnosis not present

## 2018-08-09 DIAGNOSIS — F419 Anxiety disorder, unspecified: Secondary | ICD-10-CM | POA: Diagnosis present

## 2018-08-09 DIAGNOSIS — E1140 Type 2 diabetes mellitus with diabetic neuropathy, unspecified: Secondary | ICD-10-CM | POA: Diagnosis present

## 2018-08-09 DIAGNOSIS — I714 Abdominal aortic aneurysm, without rupture: Secondary | ICD-10-CM | POA: Diagnosis present

## 2018-08-09 DIAGNOSIS — Z9114 Patient's other noncompliance with medication regimen: Secondary | ICD-10-CM | POA: Diagnosis not present

## 2018-08-09 DIAGNOSIS — Z79899 Other long term (current) drug therapy: Secondary | ICD-10-CM | POA: Diagnosis not present

## 2018-08-09 DIAGNOSIS — G4730 Sleep apnea, unspecified: Secondary | ICD-10-CM | POA: Diagnosis present

## 2018-08-09 DIAGNOSIS — Z888 Allergy status to other drugs, medicaments and biological substances status: Secondary | ICD-10-CM | POA: Diagnosis not present

## 2018-08-09 DIAGNOSIS — I700 Atherosclerosis of aorta: Secondary | ICD-10-CM | POA: Diagnosis present

## 2018-08-09 DIAGNOSIS — B1920 Unspecified viral hepatitis C without hepatic coma: Secondary | ICD-10-CM | POA: Diagnosis present

## 2018-08-09 DIAGNOSIS — E1165 Type 2 diabetes mellitus with hyperglycemia: Secondary | ICD-10-CM | POA: Diagnosis present

## 2018-08-09 DIAGNOSIS — G8929 Other chronic pain: Secondary | ICD-10-CM | POA: Diagnosis present

## 2018-08-09 DIAGNOSIS — Z7982 Long term (current) use of aspirin: Secondary | ICD-10-CM | POA: Diagnosis not present

## 2018-08-09 DIAGNOSIS — Z590 Homelessness: Secondary | ICD-10-CM | POA: Diagnosis not present

## 2018-08-09 DIAGNOSIS — F1420 Cocaine dependence, uncomplicated: Secondary | ICD-10-CM | POA: Diagnosis present

## 2018-08-09 DIAGNOSIS — F209 Schizophrenia, unspecified: Secondary | ICD-10-CM | POA: Diagnosis present

## 2018-08-09 DIAGNOSIS — I110 Hypertensive heart disease with heart failure: Secondary | ICD-10-CM | POA: Diagnosis present

## 2018-08-09 DIAGNOSIS — F17210 Nicotine dependence, cigarettes, uncomplicated: Secondary | ICD-10-CM | POA: Diagnosis present

## 2018-08-09 DIAGNOSIS — Z833 Family history of diabetes mellitus: Secondary | ICD-10-CM | POA: Diagnosis not present

## 2018-08-09 DIAGNOSIS — F1024 Alcohol dependence with alcohol-induced mood disorder: Secondary | ICD-10-CM | POA: Diagnosis present

## 2018-08-09 DIAGNOSIS — F1910 Other psychoactive substance abuse, uncomplicated: Secondary | ICD-10-CM | POA: Diagnosis present

## 2018-08-09 LAB — GLUCOSE, CAPILLARY
Glucose-Capillary: 136 mg/dL — ABNORMAL HIGH (ref 70–99)
Glucose-Capillary: 259 mg/dL — ABNORMAL HIGH (ref 70–99)

## 2018-08-09 LAB — BASIC METABOLIC PANEL
Anion gap: 8 (ref 5–15)
BUN: 19 mg/dL (ref 6–20)
CHLORIDE: 106 mmol/L (ref 98–111)
CO2: 25 mmol/L (ref 22–32)
CREATININE: 1.11 mg/dL (ref 0.61–1.24)
Calcium: 8.5 mg/dL — ABNORMAL LOW (ref 8.9–10.3)
GFR calc Af Amer: >60 mL/min (ref >60)
GFR calc non Af Amer: >60 mL/min (ref >60)
Glucose, Bld: 165 mg/dL — ABNORMAL HIGH (ref 70–99)
Potassium: 3.7 mmol/L (ref 3.5–5.1)
SODIUM: 139 mmol/L (ref 135–145)

## 2018-08-09 MED ORDER — ASPIRIN EC 81 MG PO TBEC: 81.0000 mg | DELAYED_RELEASE_TABLET | Freq: Every day | ORAL | 0 refills | Status: DC

## 2018-08-09 NOTE — Progress Notes (Signed)
Patient is asking two ice cream, 2 sprite. I educated on fluid intake since he is here for this admission and we are diuresing him, and also avoiding high sugary drinks. He started yelling and getting agitated. Charge nurse is aware, will continue to monitor. Patient asked for tylenol I went to his room to give it to him but said he wasn't going to take from me he wants a different nurse.

## 2018-08-09 NOTE — Progress Notes (Signed)
.   MEWS/VS Documentation      08/08/2018 2100 08/09/2018 0016 08/09/2018 0018 08/09/2018 0028   MEWS Score:  2  -  0  2   MEWS Score Color:  Yellow  -  Green  Yellow   Resp:  -  -  20  -   Pulse:  -  -  95  -   BP:  -  -  (!) 151/88  -   Temp:  -  -  99.5 F (37.5 C)  -   O2 Device:  -  Room Air  Nasal Cannula  -   O2 Flow Rate (L/min):  -  -  2 L/min  -   Level of Consciousness:  Alert  -  -  -

## 2018-08-09 NOTE — Progress Notes (Signed)
PROGRESS NOTE    [name]  ZOX:[id number] DOB: 1960/11/18 DOA: 08/07/2018 PCP: Lavinia SharpsPlacey, [name], NP  Outpatient Specialists:     Brief Narrative:  [name] is a 58 y.o. male with medical history significant of systolic heart failure with ejection fraction 35%, homelessness, crack cocaine use, noncompliance, hypertension, schizophrenia, recent admission in left AGAINST MEDICAL ADVICE coming to the emergency room with multiple complaints including shortness of breath, nowhere to live, feeling cold and weak.  According to the patient, he left hospital 1 week ago AGAINST MEDICAL ADVICE, he says that he never felt better.  Nobody is taking care of him.  Is progressively worsening shortness of breath and he cannot ambulate around.  He is not taking his medications.  He complains of worsening shortness of breath, dry cough and leg swelling.  On presentation, the proBNP was 900, chest x-ray revealed chronic cardiomegaly with prominent interstitial fluid.  08/09/2018: Patient seen.  Patient is a very poor historian.  Patient continues to report shortness of breath and being anxious.   Assessment & Plan:   Principal Problem:   Acute on chronic combined systolic (congestive) and diastolic (congestive) heart failure (HCC) Active Problems:   Diabetes mellitus (HCC)   Essential hypertension, benign   Cocaine use disorder, severe, dependence (HCC)   Substance or medication-induced bipolar and related disorder with onset during intoxication (HCC)   Homelessness   Smoker  Acute on chronic combined systolic and diastolic heart failure: Severe noncompliance and ongoing cocaine use.  Not using and diuretics at home.  Patient will need monitoring and admission in the hospital to optimize his volume status, however doubtful compliance after discharge. Admit to telemetry.  Lasix 40 mg IV twice a day.  Continue beta-blockers and lisinopril.  Known ejection fraction 35%, no evidence of active  chest pain or coronary syndrome, will not repeat echocardiogram as this will not change any management.  Continue aspirin and statin. Case management consult, may need medication assistance on discharge to homeless shelter. 08/09/2018: Continue current management.  Echocardiogram.  A.m. labs.  Type 2 diabetes: Uncontrolled because of not using medications.  Resume insulin and monitor. 08/09/2018: Continue to optimize.  Hypertension: Blood pressures are fairly stable.  Will resume beta-blockers as centimeters and amlodipine and monitor. 08/09/2018: Continue to optimize.  Continue current medication.  Substance abuse: Cocaine use Discussed extensively, however patient is not receptive.  We will continue to counsel. 08/09/2018: Continues.  Patient has not very forthcoming.  Smoker:  Counseled to quit.  Patient was not receptive and quite irritable on discussion.  We will continue efforts to counsel.   DVT prophylaxis: Lovenox. Code Status: Full code. Family Communication: No family at bedside. Disposition Plan: Homeless shelter. Consults called: None.  Plan: Will depend on hospital course.  Antimicrobials:   None   Subjective: Reports shortness of breath and anxiety.  Objective: Vitals:   08/09/18 0016 08/09/18 0018 08/09/18 0500 08/09/18 1246  BP:  (!) 151/88 (!) 162/89 (!) 153/111  Pulse:  95 92 93  Resp:  20 20 18   Temp:  99.5 F (37.5 C) 99 F (37.2 C) 98.7 F (37.1 C)  TempSrc:  Oral Oral Oral  SpO2: (!) 83% 96% 98% 98%  Weight:   84.2 kg   Height:        Intake/Output Summary (Last 24 hours) at 08/09/2018 1249 Last data filed at 08/09/2018 [id number] Gross per 24 hour  Intake 960 ml  Output 1250 ml  Net -290 ml  Filed Weights   08/08/18 1837 08/09/18 0500  Weight: 85.5 kg 84.2 kg    Examination:  General exam: Gets anxious easily. Respiratory system: Adequate air entry, with no added sounds. Cardiovascular system: S1 & S2 heard. Gastrointestinal system:  Abdomen is nondistended, soft and nontender. No organomegaly or masses felt. Normal bowel sounds heard. Central nervous system: Alert and oriented.  Patient moves all limbs.    Data Reviewed: I have personally reviewed following labs and imaging studies  CBC: Recent Labs  Lab 08/03/18 0524 08/07/18 2326  WBC 8.9 6.4  NEUTROABS 6.4  --   HGB 11.7* 10.6*  HCT 37.7* 34.8*  MCV 86.5 87.9  PLT 266 252   Basic Metabolic Panel: Recent Labs  Lab 08/03/18 0524 08/07/18 2326 08/09/18 0502  NA 143 138 139  K 3.5 4.2 3.7  CL 107 108 106  CO2 23 24 25   GLUCOSE 51* 214* 165*  BUN 22* 16 19  CREATININE 0.97 1.03 1.11  CALCIUM 9.0 8.6* 8.5*   GFR: Estimated Creatinine Clearance: 73.4 mL/min (by C-G formula based on SCr of 1.11 mg/dL). Liver Function Tests: No results for input(s): AST, ALT, ALKPHOS, BILITOT, PROT, ALBUMIN in the last 168 hours. No results for input(s): LIPASE, AMYLASE in the last 168 hours. No results for input(s): AMMONIA in the last 168 hours. Coagulation Profile: No results for input(s): INR, PROTIME in the last 168 hours. Cardiac Enzymes: Recent Labs  Lab 08/03/18 0524  TROPONINI 0.05*   BNP (last 3 results) No results for input(s): PROBNP in the last 8760 hours. HbA1C: No results for input(s): HGBA1C in the last 72 hours. CBG: Recent Labs  Lab 08/07/18 1929 08/08/18 1631 08/08/18 2119 08/09/18 0745  GLUCAP 234* 223* 148* 136*   Lipid Profile: No results for input(s): CHOL, HDL, LDLCALC, TRIG, CHOLHDL, LDLDIRECT in the last 72 hours. Thyroid Function Tests: No results for input(s): TSH, T4TOTAL, FREET4, T3FREE, THYROIDAB in the last 72 hours. Anemia Panel: No results for input(s): VITAMINB12, FOLATE, FERRITIN, TIBC, IRON, RETICCTPCT in the last 72 hours. Urine analysis:    Component Value Date/Time   COLORURINE YELLOW 07/30/2018 0430   APPEARANCEUR CLEAR 07/30/2018 0430   LABSPEC 1.012 07/30/2018 0430   PHURINE 6.0 07/30/2018 0430    GLUCOSEU 150 (A) 07/30/2018 0430   HGBUR NEGATIVE 07/30/2018 0430   BILIRUBINUR NEGATIVE 07/30/2018 0430   KETONESUR NEGATIVE 07/30/2018 0430   PROTEINUR 30 (A) 07/30/2018 0430   UROBILINOGEN 1.0 03/14/2015 0610   NITRITE NEGATIVE 07/30/2018 0430   LEUKOCYTESUR NEGATIVE 07/30/2018 0430   Sepsis Labs: @LABRCNTIP (procalcitonin:4,lacticidven:4)  )No results found for this or any previous visit (from the past 240 hour(s)).       Radiology Studies: Dg Chest 2 View  Result Date: 08/08/2018 CLINICAL DATA:  Initial evaluation for acute shortness of breath EXAM: CHEST - 2 VIEW COMPARISON:  Prior radiograph from 08/03/2018 FINDINGS: Cardiomegaly, stable from previous. Mediastinal silhouette within normal limits. Aortic atherosclerosis. Lungs normally inflated. Diffuse vascular and interstitial congestion, suggesting mild diffuse pulmonary interstitial edema. Small bilateral pleural effusions. Left perihilar atelectasis and/or scarring, stable. No other focal infiltrates. No pneumothorax. No acute osseous finding. IMPRESSION: 1. Cardiomegaly with mild diffuse pulmonary interstitial edema and small bilateral pleural effusions, suggesting acute CHF. 2. Superimposed left perihilar atelectasis/scarring. 3. Aortic atherosclerosis. Electronically Signed   By: Rise MuBenjamin  McClintock M.D.   On: 08/08/2018 00:03        Scheduled Meds: . amLODipine  10 mg Oral Daily  . aspirin EC  81 mg  Oral Daily  . atorvastatin  40 mg Oral Daily  . enoxaparin (LOVENOX) injection  40 mg Subcutaneous Q24H  . furosemide  40 mg Intravenous BID  . insulin aspart  0-5 Units Subcutaneous QHS  . insulin aspart  0-9 Units Subcutaneous TID WC  . insulin glargine  20 Units Subcutaneous QHS  . lisinopril  5 mg Oral Daily  . potassium chloride  20 mEq Oral BID  . sodium chloride flush  3 mL Intravenous Once  . sodium chloride flush  3 mL Intravenous Q12H   Continuous Infusions: . sodium chloride       LOS: 0 days     Time spent: 18 Minutes  Berton Mount, MD  [facility] Pager #: [phone] 7PM-7AM contact night coverage as above

## 2018-08-09 NOTE — Discharge Summary (Signed)
Physician Discharge Summary  Patient ID: DEMON HOTOP MRN: [id number] DOB/AGE: [birth year] 58 y.o.  Admit date: 08/07/2018 Discharge date: 08/09/2018  Admission Diagnoses:  Discharge Diagnoses:  Principal Problem:   Acute on chronic combined systolic (congestive) and diastolic (congestive) heart failure (HCC) Active Problems:   Diabetes mellitus (HCC)   Essential hypertension, benign   Cocaine use disorder, severe, dependence (HCC)   Substance or medication-induced bipolar and related disorder with onset during intoxication (HCC)   CHF (congestive heart failure) (HCC)   Homelessness   Smoker   Discharged Condition: Patient left the hospital AGAINST MEDICAL ADVICE.  Hospital Course:  Taylor Ebersole Whitsettis a 58 year old male, with past medical history significant forsystolic heart failure with ejection fraction 35%, homelessness, crack cocaine use, noncompliance, hypertension, schizophrenia, recent admission in left AGAINST MEDICAL ADVICE coming to the emergency room with multiple complaints including shortness of breath, nowhere to live, feeling cold and weak. According to the patient, he left hospital 1 week ago AGAINST MEDICAL ADVICE, he says that he never felt better. Nobody is taking care of him. Is progressively worsening shortness of breath and he cannot ambulate around. He has not been taking his medications.  Patient reported worsening shortness of breath, dry cough and leg swelling.  On presentation, the proBNP was 900, chest x-ray revealed chronic cardiomegaly with prominent interstitial fluid.  Patient left the hospital AGAINST MEDICAL ADVICE.  Consults: None   Discharge Exam: Blood pressure (!) 153/111, pulse 93, temperature 98.7 F (37.1 C), temperature source Oral, resp. rate 18, height 5\' 9"  (1.753 m), weight 84.2 kg, SpO2 98 %.  Disposition: Patient left the hospital AGAINST MEDICAL ADVICE   Allergies as of 08/09/2018      Reactions   Haldol  [haloperidol] Other (See Comments)   Muscle spasms, loss of voluntary movement. However, pt has taken Thorazine on multiple occasions with no adverse effects.       Medication List    STOP taking these medications   ibuprofen 400 MG tablet Commonly known as:  ADVIL,MOTRIN     TAKE these medications   amLODipine 10 MG tablet Commonly known as:  NORVASC Take 1 tablet (10 mg total) by mouth daily.   aspirin EC 81 MG tablet Take 1 tablet (81 mg total) by mouth daily.   atorvastatin 40 MG tablet Commonly known as:  LIPITOR Take 1 tablet (40 mg total) by mouth daily.   furosemide 20 MG tablet Commonly known as:  LASIX Take 40 mg oral daily, and 20 mg every evening   Insulin Glargine 100 UNIT/ML Solostar Pen Commonly known as:  LANTUS Inject 20 Units into the skin daily.   Insulin Pen Needle 29G X Misc Use with insulin   lisinopril 5 MG tablet Commonly known as:  PRINIVIL,ZESTRIL Take 1 tablet (5 mg total) by mouth daily.        SignedBarnetta Chapel 08/09/2018, 4:42 PM

## 2018-08-09 NOTE — Progress Notes (Signed)
Patient had run on SVT and 18beat run of vtach. On-call provider notified

## 2018-08-10 ENCOUNTER — Encounter (HOSPITAL_COMMUNITY): Payer: Self-pay | Admitting: *Deleted

## 2018-08-10 ENCOUNTER — Other Ambulatory Visit: Payer: Self-pay

## 2018-08-10 ENCOUNTER — Emergency Department (HOSPITAL_COMMUNITY)
Admission: EM | Admit: 2018-08-10 | Discharge: 2018-08-11 | Disposition: A | Discharge status: Home / Self Care | Payer: Medicaid Other | Attending: Emergency Medicine | Admitting: Emergency Medicine

## 2018-08-10 DIAGNOSIS — E1140 Type 2 diabetes mellitus with diabetic neuropathy, unspecified: Secondary | ICD-10-CM | POA: Diagnosis not present

## 2018-08-10 DIAGNOSIS — K6289 Other specified diseases of anus and rectum: Secondary | ICD-10-CM

## 2018-08-10 DIAGNOSIS — I110 Hypertensive heart disease with heart failure: Secondary | ICD-10-CM | POA: Diagnosis not present

## 2018-08-10 DIAGNOSIS — Z7982 Long term (current) use of aspirin: Secondary | ICD-10-CM | POA: Diagnosis not present

## 2018-08-10 DIAGNOSIS — F17210 Nicotine dependence, cigarettes, uncomplicated: Secondary | ICD-10-CM | POA: Insufficient documentation

## 2018-08-10 DIAGNOSIS — Z794 Long term (current) use of insulin: Secondary | ICD-10-CM | POA: Diagnosis not present

## 2018-08-10 DIAGNOSIS — I509 Heart failure, unspecified: Secondary | ICD-10-CM | POA: Diagnosis not present

## 2018-08-10 DIAGNOSIS — Z79899 Other long term (current) drug therapy: Secondary | ICD-10-CM | POA: Insufficient documentation

## 2018-08-10 DIAGNOSIS — K649 Unspecified hemorrhoids: Secondary | ICD-10-CM | POA: Diagnosis not present

## 2018-08-10 NOTE — ED Triage Notes (Signed)
Pt reports having constipation and rectal pain and irritation. Denies bleeding. No acute distress is noted at triage.

## 2018-08-11 MED ORDER — ACETAMINOPHEN 500 MG PO TABS
1000.0000 mg | ORAL_TABLET | Freq: Once | ORAL | Status: DC
  Filled 2018-08-11: qty 2

## 2018-08-11 NOTE — Discharge Instructions (Signed)
You may alternate Tylenol 1000 mg every 6 hours as needed for pain and Ibuprofen 800 mg every 8 hours as needed for pain.  Please take Ibuprofen with food. ° °

## 2018-08-11 NOTE — ED Provider Notes (Signed)
TIME SEEN: 5:17 AM  CHIEF COMPLAINT: "I just need Tylenol for my hemorrhoids"  HPI: Patient is a 58 year old male with history of homelessness, substance abuse, schizophrenia who is well-known to our emergency department with 24 visits in the past 6 months requesting Tylenol for rectal pain.  States he feels like he has hemorrhoids.  No bloody stools or melena.  No abdominal pain.  No chest pain or shortness of breath.  ROS: See HPI Constitutional: no fever  Eyes: no drainage  ENT: no runny nose   Cardiovascular:  no chest pain  Resp: no SOB  GI: no vomiting GU: no dysuria Integumentary: no rash  Allergy: no hives  Musculoskeletal: no leg swelling  Neurological: no slurred speech ROS otherwise negative  PAST MEDICAL HISTORY/PAST SURGICAL HISTORY:  Past Medical History:  Diagnosis Date  . CHF (congestive heart failure) (HCC)   . Chronic foot pain   . Cocaine abuse (HCC)   . Diabetes mellitus without complication (HCC)   . Hepatitis C   . Homelessness   . Hypertension   . Neuropathy   . Polysubstance abuse (HCC)   . Schizophrenia (HCC)   . Sleep apnea     MEDICATIONS:  Prior to Admission medications   Medication Sig Start Date End Date Taking? Authorizing Provider  amLODipine (NORVASC) 10 MG tablet Take 1 tablet (10 mg total) by mouth daily. Patient not taking: Reported on 08/03/2018 08/03/18   Gilda CreasePollina, Christopher J, MD  aspirin EC 81 MG tablet Take 1 tablet (81 mg total) by mouth daily. 08/09/18 08/09/19  Berton Mountgbata, Sylvester I, MD  atorvastatin (LIPITOR) 40 MG tablet Take 1 tablet (40 mg total) by mouth daily. Patient not taking: Reported on 08/03/2018 08/03/18   Gilda CreasePollina, Christopher J, MD  furosemide (LASIX) 20 MG tablet Take 40 mg oral daily, and 20 mg every evening Patient not taking: Reported on 08/03/2018 08/03/18   Gilda CreasePollina, Christopher J, MD  Insulin Glargine (LANTUS) 100 UNIT/ML Solostar Pen Inject 20 Units into the skin daily. 08/03/18   Gilda CreasePollina, Christopher J, MD  Insulin Pen  Needle 29G X 8MM MISC Use with insulin 08/03/18   Pollina, Canary Brimhristopher J, MD  lisinopril (PRINIVIL,ZESTRIL) 5 MG tablet Take 1 tablet (5 mg total) by mouth daily. Patient not taking: Reported on 08/03/2018 08/03/18   Gilda CreasePollina, Christopher J, MD    ALLERGIES:  Allergies  Allergen Reactions  . Haldol [Haloperidol] Other (See Comments)    Muscle spasms, loss of voluntary movement. However, pt has taken Thorazine on multiple occasions with no adverse effects.     SOCIAL HISTORY:  Social History   Tobacco Use  . Smoking status: Current Every Day Smoker    Packs/day: 1.00    Years: 20.00    Pack years: 20.00    Types: Cigarettes  . Smokeless tobacco: Current User  Substance Use Topics  . Alcohol use: Yes    Comment: Daily Drinker     FAMILY HISTORY: Family History  Problem Relation Age of Onset  . Hypertension Other   . Diabetes Other     EXAM: BP (!) 165/95 (BP Location: Right Arm)   Pulse 98   Temp 98.4 F (36.9 C) (Oral)   Resp 19   SpO2 93%  CONSTITUTIONAL: Alert and oriented and responds appropriately to questions.  Chronically ill-appearing, no distress HEAD: Normocephalic EYES: Conjunctivae clear, pupils appear equal, EOMI ENT: normal nose; moist mucous membranes NECK: Supple, no meningismus, no nuchal rigidity, no LAD  CARD: RRR; S1 and S2 appreciated; no  murmurs, no clicks, no rubs, no gallops RESP: Normal chest excursion without splinting or tachypnea; breath sounds clear and equal bilaterally; no wheezes, no rhonchi, no rales, no hypoxia or respiratory distress, speaking full sentences ABD/GI: Normal bowel sounds; non-distended; soft, non-tender, no rebound, no guarding, no peritoneal signs, no hepatosplenomegaly RECTAL:  Normal rectal tone, no gross blood or melena, no hemorrhoids appreciated, nontender rectal exam, no fecal impaction BACK:  The back appears normal and is non-tender to palpation, there is no CVA tenderness EXT: Normal ROM in all joints; non-tender to  palpation; no edema; normal capillary refill; no cyanosis, no calf tenderness or swelling    SKIN: Normal color for age and race; warm; no rash NEURO: Moves all extremities equally PSYCH: The patient's mood and manner are appropriate. Grooming and personal hygiene are appropriate.  Calm and cooperative.  Is not endorsing suicidality.  MEDICAL DECISION MAKING: Patient here with rectal pain.  No sign of perirectal or perianal abscess.  No sign of hemorrhoid.  No sign of bleeding.  Abdominal exam benign.  Given Tylenol per his request.  He has been given outpatient resources several times in the past.  States he will follow-up with [facility] today.  No psychiatric emergency.  No chest pain or shortness of breath.  No hypoxia.  Does have history of CHF and has recently been admitted for CHF exacerbation.  At this time, I do not feel there is any life-threatening condition present. I have reviewed and discussed all results (EKG, imaging, lab, urine as appropriate) and exam findings with patient/family. I have reviewed nursing notes and appropriate previous records.  I feel the patient is safe to be discharged home without further emergent workup and can continue workup as an outpatient as needed. Discussed usual and customary return precautions. Patient/family verbalize understanding and are comfortable with this plan.  Outpatient follow-up has been provided as needed. All questions have been answered.      Titan Karner, Layla Maw, DO 08/11/18 [phone]

## 2018-08-11 NOTE — ED Notes (Signed)
Patient was called x2 by registration and by EMT with no answer.  Staff looked over waiting room and restrooms.
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
## 2018-08-14 ENCOUNTER — Emergency Department (HOSPITAL_COMMUNITY)
Admission: EM | Admit: 2018-08-14 | Discharge: 2018-08-14 | Disposition: A | Discharge status: Home / Self Care | Payer: Medicaid Other | Attending: Emergency Medicine | Admitting: Emergency Medicine

## 2018-08-14 ENCOUNTER — Encounter (HOSPITAL_COMMUNITY): Payer: Self-pay

## 2018-08-14 ENCOUNTER — Other Ambulatory Visit: Payer: Self-pay

## 2018-08-14 DIAGNOSIS — F17210 Nicotine dependence, cigarettes, uncomplicated: Secondary | ICD-10-CM | POA: Insufficient documentation

## 2018-08-14 DIAGNOSIS — I5043 Acute on chronic combined systolic (congestive) and diastolic (congestive) heart failure: Secondary | ICD-10-CM | POA: Insufficient documentation

## 2018-08-14 DIAGNOSIS — R609 Edema, unspecified: Secondary | ICD-10-CM

## 2018-08-14 DIAGNOSIS — Z5900 Homelessness unspecified: Secondary | ICD-10-CM

## 2018-08-14 DIAGNOSIS — Z9114 Patient's other noncompliance with medication regimen: Secondary | ICD-10-CM | POA: Insufficient documentation

## 2018-08-14 DIAGNOSIS — I110 Hypertensive heart disease with heart failure: Secondary | ICD-10-CM | POA: Insufficient documentation

## 2018-08-14 DIAGNOSIS — R739 Hyperglycemia, unspecified: Secondary | ICD-10-CM

## 2018-08-14 DIAGNOSIS — E1165 Type 2 diabetes mellitus with hyperglycemia: Secondary | ICD-10-CM | POA: Insufficient documentation

## 2018-08-14 DIAGNOSIS — R600 Localized edema: Secondary | ICD-10-CM | POA: Diagnosis present

## 2018-08-14 LAB — CBG MONITORING, ED
Glucose-Capillary: 297 mg/dL — ABNORMAL HIGH (ref 70–99)
Glucose-Capillary: 188 mg/dL — ABNORMAL HIGH (ref 70–99)

## 2018-08-14 MED ORDER — ACETAMINOPHEN 325 MG PO TABS
650.0000 mg | ORAL_TABLET | Freq: Once | ORAL | Status: AC
  Administered 2018-08-14: 650 mg via ORAL
  Filled 2018-08-14: qty 2

## 2018-08-14 NOTE — Discharge Instructions (Signed)
It is important to take your prescription medications.  Go to the [facility] today to get started on your medicines.

## 2018-08-14 NOTE — ED Triage Notes (Addendum)
[facility] for eval of hyperglycemia, leg swelling and "urinary incontinence". Pt sleeping on arrival. Pt states +crack use

## 2018-08-14 NOTE — ED Provider Notes (Signed)
[facility] EMERGENCY DEPARTMENT Provider Note   CSN: [id number] Arrival date & time: 08/14/18  0248     History   Chief Complaint Chief Complaint  Patient presents with  . Hyperglycemia  . Leg Swelling    HPI [name] is a 58 y.o. male.  HPI  He complains of swelling in his ankles, and has not taken his medications for over a week.  He states he does not have any.  Patient is homeless.  He has frequent ED visits and was last in the ED complaining of rectal pain, treated with Tylenol.  He denies shortness of breath, cough, dizziness.  There are no other known modifying factors.  Past Medical History:  Diagnosis Date  . CHF (congestive heart failure) (HCC)   . Chronic foot pain   . Cocaine abuse (HCC)   . Diabetes mellitus without complication (HCC)   . Hepatitis C   . Homelessness   . Hypertension   . Neuropathy   . Polysubstance abuse (HCC)   . Schizophrenia (HCC)   . Sleep apnea     Patient Active Problem List   Diagnosis Date Noted  . 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 Homelessness 08/08/2018  . Smoker 08/08/2018  . CHF (congestive heart failure) (HCC) 07/15/2018  . Acute exacerbation of CHF (congestive heart failure) (HCC) 03/28/2018  . Prostate enlargement 03/16/2018  . Aortic atherosclerosis (HCC) 03/16/2018  . 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 Chronic foot pain   . Schizoaffective disorder, bipolar type (HCC) 09/30/2016  . Substance induced mood disorder (HCC) 03/13/2015  . Acute kidney failure (HCC) 01/26/2015  . Schizophrenia, paranoid type (HCC) 01/17/2015  . Suicidal ideation   . Drug hallucinosis (HCC) 10/08/2014  . Chronic paranoid schizophrenia (HCC) 09/07/2014  . 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 Urinary retention   . Cocaine use disorder, severe,  dependence (HCC)   . Essential hypertension, benign 03/28/2013  . Diabetes mellitus (HCC) 03/15/2013    Past Surgical History:  Procedure Laterality Date  . MULTIPLE TOOTH EXTRACTIONS          Home Medications    Prior to Admission medications   Medication Sig Start Date End Date Taking? Authorizing Provider  amLODipine (NORVASC) 10 MG tablet Take 1 tablet (10 mg total) by mouth daily. Patient not taking: Reported on 08/03/2018 08/03/18   Gilda Crease, MD  aspirin EC 81 MG tablet Take 1 tablet (81 mg total) by mouth daily. 08/09/18 08/09/19  Berton Mount I, MD  atorvastatin (LIPITOR) 40 MG tablet Take 1 tablet (40 mg total) by mouth daily. Patient not taking: Reported on 08/03/2018 08/03/18   Gilda Crease, MD  furosemide (LASIX) 20 MG tablet Take 40 mg oral daily, and 20 mg every evening Patient not taking: Reported on 08/03/2018 08/03/18   Gilda Crease, MD  Insulin Glargine (LANTUS) 100 UNIT/ML Solostar Pen Inject 20 Units into the skin daily. 08/03/18   Gilda Crease, MD  Insulin Pen Needle 29G X MISC Use with insulin 08/03/18   Pollina, Canary Brim, MD  lisinopril (PRINIVIL,ZESTRIL) 5 MG tablet Take 1 tablet (5 mg total) by mouth daily. Patient not taking: Reported on 08/03/2018 08/03/18   Gilda Crease, MD    Family History Family History  Problem Relation Age of Onset  . Hypertension Other   . Diabetes Other  Social History Social History   Tobacco Use  . Smoking status: Current Every Day Smoker    Packs/day: 1.00    Years: 20.00    Pack years: 20.00    Types: Cigarettes  . Smokeless tobacco: Current User  Substance Use Topics  . Alcohol use: Yes    Comment: Daily Drinker   . Drug use: Yes    Frequency: 7.0 times per week    Types: "Crack" cocaine, Cocaine, Marijuana    Comment: Cocaine tonight, Marijuana "a long time"     Allergies   Haldol [haloperidol]   Review of Systems Review of Systems  All other systems  reviewed and are negative.    Physical Exam Updated Vital Signs BP (!) 166/102 (BP Location: Right Arm)   Pulse (!) 102   Temp 99.1 F (37.3 C) (Oral)   Resp 18   Ht 5\' 9"  (1.753 m)   Wt 84 kg   SpO2 92%   BMI 27.35 kg/m   Physical Exam Vitals signs and nursing note reviewed.  Constitutional:      General: He is not in acute distress.    Appearance: He is well-developed and normal weight. He is not ill-appearing or diaphoretic.     Comments: Disheveled  HENT:     Head: Normocephalic and atraumatic.     Right Ear: External ear normal.     Left Ear: External ear normal.     Nose: No congestion or rhinorrhea.     Mouth/Throat:     Pharynx: No posterior oropharyngeal erythema.  Eyes:     Conjunctiva/sclera: Conjunctivae normal.     Pupils: Pupils are equal, round, and reactive to light.  Neck:     Musculoskeletal: Normal range of motion and neck supple.     Trachea: Phonation normal.  Cardiovascular:     Rate and Rhythm: Normal rate.  Pulmonary:     Effort: Pulmonary effort is normal.  Musculoskeletal: Normal range of motion.  Skin:    General: Skin is warm and dry.  Neurological:     Mental Status: He is alert and oriented to person, place, and time.     Cranial Nerves: No cranial nerve deficit.     Sensory: No sensory deficit.     Motor: No abnormal muscle tone.     Coordination: Coordination normal.  Psychiatric:        Mood and Affect: Mood normal.        Behavior: Behavior normal.      ED Treatments / Results  Labs (all labs ordered are listed, but only abnormal results are displayed) Labs Reviewed  CBG MONITORING, ED - Abnormal; Notable for the following components:      Result Value   Glucose-Capillary 188 (*)    All other components within normal limits    EKG None  Radiology No results found.  Procedures Procedures (including critical care time)  Medications Ordered in ED Medications  acetaminophen (TYLENOL) tablet 650 mg (650 mg Oral  Given 08/14/18 2297)     Initial Impression / Assessment and Plan / ED Course  I have reviewed the triage vital signs and the nursing notes.  Pertinent labs & imaging results that were available during my care of the patient were reviewed by me and considered in my medical decision making (see chart for details).      Patient Vitals for the past 24 hrs:  BP Temp Temp src Pulse Resp SpO2 Height Weight  08/14/18 0736 (!) 166/102 99.1 F (  37.3 C) Oral (!) 102 18 92 % - -  08/14/18 0257 - - - - - - 5\' 9"  (1.753 m) 84 kg  08/14/18 0253 - - - - - 94 % - -  08/14/18 0200 (!) 173/108 98.9 F (37.2 C) Oral 93 18 - - -    7:47 AM Reevaluation with update and discussion. After initial assessment and treatment, an updated evaluation reveals no change in clinical status.  Findings discussed with the patient and all questions were answered. Mancel BaleElliott Heela Heishman   Medical Decision Making: Home assessed with medication noncompliance.  No indication for overt generalized infection, metabolic instability or suggestion for impending vascular collapse.  Patient appears to be at his baseline.  [facility]  CRITICAL CARE-no Performed by: Mancel BaleElliott Shamona Wirtz    Nursing Notes Reviewed/ Care Coordinated Applicable Imaging Reviewed Interpretation of Laboratory Data incorporated into ED treatment  The patient appears reasonably screened and/or stabilized for discharge and I doubt any other medical condition or other [facility] requiring further screening, evaluation, or treatment in the ED at this time prior to discharge.  Plan: Home Medications-OTC as needed; Home Treatments-healthy diet; return here if the recommended treatment, does not improve the symptoms; Recommended follow up-[facility] follow-up ASAP   Final Clinical Impressions(s) / ED Diagnoses   Final diagnoses:  Homelessness  Peripheral edema  Noncompliance with medication regimen  Hyperglycemia    ED Discharge Orders    None         Mancel BaleWentz, Jhace Fennell, MD 08/14/18 [phone]

## 2018-08-14 NOTE — ED Notes (Signed)
Pt given discharge instruction. Pt given follow up information and the opportunity to ask questions. Pt verbalized understanding.

## 2018-08-17 ENCOUNTER — Inpatient Hospital Stay (HOSPITAL_COMMUNITY)
Admission: EM | Admit: 2018-08-17 | Discharge: 2018-08-20 | DRG: 292 | Discharge status: Against Medical Advice | Payer: Medicare Other | Attending: Internal Medicine | Admitting: Internal Medicine

## 2018-08-17 ENCOUNTER — Emergency Department (HOSPITAL_COMMUNITY): Payer: Medicare Other

## 2018-08-17 ENCOUNTER — Other Ambulatory Visit: Payer: Self-pay

## 2018-08-17 DIAGNOSIS — I5043 Acute on chronic combined systolic (congestive) and diastolic (congestive) heart failure: Secondary | ICD-10-CM | POA: Diagnosis present

## 2018-08-17 DIAGNOSIS — Z7982 Long term (current) use of aspirin: Secondary | ICD-10-CM

## 2018-08-17 DIAGNOSIS — I10 Essential (primary) hypertension: Secondary | ICD-10-CM | POA: Diagnosis present

## 2018-08-17 DIAGNOSIS — E119 Type 2 diabetes mellitus without complications: Secondary | ICD-10-CM

## 2018-08-17 DIAGNOSIS — I110 Hypertensive heart disease with heart failure: Principal | ICD-10-CM | POA: Diagnosis present

## 2018-08-17 DIAGNOSIS — F200 Paranoid schizophrenia: Secondary | ICD-10-CM | POA: Diagnosis present

## 2018-08-17 DIAGNOSIS — I5023 Acute on chronic systolic (congestive) heart failure: Secondary | ICD-10-CM

## 2018-08-17 DIAGNOSIS — Z8619 Personal history of other infectious and parasitic diseases: Secondary | ICD-10-CM

## 2018-08-17 DIAGNOSIS — G4730 Sleep apnea, unspecified: Secondary | ICD-10-CM | POA: Diagnosis present

## 2018-08-17 DIAGNOSIS — Z5900 Homelessness unspecified: Secondary | ICD-10-CM

## 2018-08-17 DIAGNOSIS — Z9119 Patient's noncompliance with other medical treatment and regimen: Secondary | ICD-10-CM

## 2018-08-17 DIAGNOSIS — F1410 Cocaine abuse, uncomplicated: Secondary | ICD-10-CM | POA: Diagnosis present

## 2018-08-17 DIAGNOSIS — Z8249 Family history of ischemic heart disease and other diseases of the circulatory system: Secondary | ICD-10-CM

## 2018-08-17 DIAGNOSIS — F17210 Nicotine dependence, cigarettes, uncomplicated: Secondary | ICD-10-CM | POA: Diagnosis present

## 2018-08-17 DIAGNOSIS — G8929 Other chronic pain: Secondary | ICD-10-CM | POA: Diagnosis present

## 2018-08-17 DIAGNOSIS — Z794 Long term (current) use of insulin: Secondary | ICD-10-CM

## 2018-08-17 DIAGNOSIS — Z720 Tobacco use: Secondary | ICD-10-CM | POA: Diagnosis present

## 2018-08-17 DIAGNOSIS — M79673 Pain in unspecified foot: Secondary | ICD-10-CM | POA: Diagnosis present

## 2018-08-17 DIAGNOSIS — Z833 Family history of diabetes mellitus: Secondary | ICD-10-CM

## 2018-08-17 DIAGNOSIS — Z79899 Other long term (current) drug therapy: Secondary | ICD-10-CM

## 2018-08-17 DIAGNOSIS — Z888 Allergy status to other drugs, medicaments and biological substances status: Secondary | ICD-10-CM

## 2018-08-17 NOTE — ED Triage Notes (Signed)
[facility]. Pt reports he has been in jail x 48 hours. Pt reports SOB and "swollen throat". Oropharynx is clear and patent, no swelling. Pt respiratory status appears at baseline as he is well known to ED, satting well on RA @95 %. Pt is placed on 2L for comfort. Pt denies crack use today.

## 2018-08-18 ENCOUNTER — Encounter (HOSPITAL_COMMUNITY): Payer: Self-pay | Admitting: Internal Medicine

## 2018-08-18 DIAGNOSIS — I5043 Acute on chronic combined systolic (congestive) and diastolic (congestive) heart failure: Secondary | ICD-10-CM | POA: Diagnosis present

## 2018-08-18 DIAGNOSIS — Z720 Tobacco use: Secondary | ICD-10-CM | POA: Diagnosis present

## 2018-08-18 LAB — URINALYSIS, ROUTINE W REFLEX MICROSCOPIC
BILIRUBIN URINE: NEGATIVE
Glucose, UA: NEGATIVE mg/dL
Hgb urine dipstick: NEGATIVE
Ketones, ur: NEGATIVE mg/dL
Leukocytes,Ua: NEGATIVE
Nitrite: NEGATIVE
Protein, ur: NEGATIVE mg/dL
Specific Gravity, Urine: 1.004 — ABNORMAL LOW (ref 1.005–1.030)
pH: 8 (ref 5.0–8.0)

## 2018-08-18 LAB — RAPID URINE DRUG SCREEN, HOSP PERFORMED
Amphetamines: NOT DETECTED
Barbiturates: NOT DETECTED
Benzodiazepines: NOT DETECTED
Cocaine: NOT DETECTED
Opiates: NOT DETECTED
Tetrahydrocannabinol: NOT DETECTED

## 2018-08-18 LAB — COMPREHENSIVE METABOLIC PANEL
ALT: 17 U/L (ref 0–44)
AST: 23 U/L (ref 15–41)
Albumin: 2.5 g/dL — ABNORMAL LOW (ref 3.5–5.0)
Alkaline Phosphatase: 79 U/L (ref 38–126)
Anion gap: 7 (ref 5–15)
BUN: 10 mg/dL (ref 6–20)
CHLORIDE: 108 mmol/L (ref 98–111)
CO2: 25 mmol/L (ref 22–32)
Calcium: 8.7 mg/dL — ABNORMAL LOW (ref 8.9–10.3)
Creatinine, Ser: 0.96 mg/dL (ref 0.61–1.24)
GFR calc Af Amer: >60 mL/min (ref >60)
GFR calc non Af Amer: >60 mL/min (ref >60)
Glucose, Bld: 140 mg/dL — ABNORMAL HIGH (ref 70–99)
Potassium: 3.6 mmol/L (ref 3.5–5.1)
Sodium: 140 mmol/L (ref 135–145)
Total Bilirubin: 0.6 mg/dL (ref 0.3–1.2)
Total Protein: 6 g/dL — ABNORMAL LOW (ref 6.5–8.1)

## 2018-08-18 LAB — CBC WITH DIFFERENTIAL/PLATELET
Abs Immature Granulocytes: 0.01 10*3/uL (ref 0.00–0.07)
Basophils Absolute: 0.1 10*3/uL (ref 0.0–0.1)
Basophils Relative: 1 %
EOS PCT: 1 %
Eosinophils Absolute: 0.1 10*3/uL (ref 0.0–0.5)
HCT: 37.1 % — ABNORMAL LOW (ref 39.0–52.0)
Hemoglobin: 11.4 g/dL — ABNORMAL LOW (ref 13.0–17.0)
Immature Granulocytes: 0 %
Lymphocytes Relative: 12 %
Lymphs Abs: 0.9 10*3/uL (ref 0.7–4.0)
MCH: 26.4 pg (ref 26.0–34.0)
MCHC: 30.7 g/dL (ref 30.0–36.0)
MCV: 85.9 fL (ref 80.0–100.0)
Monocytes Absolute: 0.6 10*3/uL (ref 0.1–1.0)
Monocytes Relative: 8 %
Neutro Abs: 5.7 10*3/uL (ref 1.7–7.7)
Neutrophils Relative %: 78 %
PLATELETS: 269 10*3/uL (ref 150–400)
RBC: 4.32 MIL/uL (ref 4.22–5.81)
RDW: 17.4 % — ABNORMAL HIGH (ref 11.5–15.5)
WBC: 7.3 10*3/uL (ref 4.0–10.5)
nRBC: 0 % (ref 0.0–0.2)

## 2018-08-18 LAB — TROPONIN I
TROPONIN I: 0.03 ng/mL — AB (ref <0.03)
Troponin I: 0.04 ng/mL (ref <0.03)

## 2018-08-18 LAB — MAGNESIUM: MAGNESIUM: 2 mg/dL (ref 1.7–2.4)

## 2018-08-18 LAB — I-STAT TROPONIN, ED: Troponin i, poc: 0.06 ng/mL (ref 0.00–0.08)

## 2018-08-18 LAB — BRAIN NATRIURETIC PEPTIDE: B NATRIURETIC PEPTIDE 5: 1312.2 pg/mL — AB (ref 0.0–100.0)

## 2018-08-18 LAB — GLUCOSE, CAPILLARY
GLUCOSE-CAPILLARY: 161 mg/dL — AB (ref 70–99)
Glucose-Capillary: 151 mg/dL — ABNORMAL HIGH (ref 70–99)

## 2018-08-18 MED ORDER — POTASSIUM CHLORIDE CRYS ER 20 MEQ PO TBCR
40.0000 meq | EXTENDED_RELEASE_TABLET | Freq: Once | ORAL | Status: AC
  Administered 2018-08-18: 40 meq via ORAL
  Filled 2018-08-18: qty 2

## 2018-08-18 MED ORDER — LISINOPRIL 5 MG PO TABS
5.0000 mg | ORAL_TABLET | Freq: Every day | ORAL | Status: DC
  Administered 2018-08-18 – 2018-08-20 (×3): 5 mg via ORAL
  Filled 2018-08-18 (×3): qty 1

## 2018-08-18 MED ORDER — FUROSEMIDE 10 MG/ML IJ SOLN
80.0000 mg | Freq: Once | INTRAMUSCULAR | Status: AC
  Administered 2018-08-18: 80 mg via INTRAVENOUS
  Filled 2018-08-18: qty 8

## 2018-08-18 MED ORDER — ACETAMINOPHEN 325 MG PO TABS
650.0000 mg | ORAL_TABLET | Freq: Once | ORAL | Status: AC
  Administered 2018-08-18: 650 mg via ORAL
  Filled 2018-08-18: qty 2

## 2018-08-18 MED ORDER — MAGNESIUM HYDROXIDE 400 MG/5ML PO SUSP
15.0000 mL | Freq: Every day | ORAL | Status: DC | PRN
  Administered 2018-08-20: 15 mL via ORAL
  Filled 2018-08-18: qty 30

## 2018-08-18 MED ORDER — ACETAMINOPHEN 650 MG RE SUPP: 650.0000 mg | Freq: Four times a day (QID) | RECTAL | Status: DC | PRN

## 2018-08-18 MED ORDER — INSULIN ASPART 100 UNIT/ML ~~LOC~~ SOLN
0.0000 [IU] | Freq: Three times a day (TID) | SUBCUTANEOUS | Status: DC
  Administered 2018-08-18: 3 [IU] via SUBCUTANEOUS
  Administered 2018-08-18: 2 [IU] via SUBCUTANEOUS
  Administered 2018-08-19 (×2): 3 [IU] via SUBCUTANEOUS
  Administered 2018-08-19: 5 [IU] via SUBCUTANEOUS
  Administered 2018-08-20: 3 [IU] via SUBCUTANEOUS

## 2018-08-18 MED ORDER — ACETAMINOPHEN 325 MG PO TABS
650.0000 mg | ORAL_TABLET | Freq: Four times a day (QID) | ORAL | Status: DC | PRN
  Administered 2018-08-18 – 2018-08-20 (×6): 650 mg via ORAL
  Filled 2018-08-18 (×6): qty 2

## 2018-08-18 MED ORDER — AMLODIPINE BESYLATE 10 MG PO TABS
10.0000 mg | ORAL_TABLET | Freq: Every day | ORAL | Status: DC
  Administered 2018-08-18 – 2018-08-20 (×3): 10 mg via ORAL
  Filled 2018-08-18 (×3): qty 1

## 2018-08-18 MED ORDER — FUROSEMIDE 10 MG/ML IJ SOLN
40.0000 mg | Freq: Two times a day (BID) | INTRAMUSCULAR | Status: DC
  Administered 2018-08-18 – 2018-08-20 (×4): 40 mg via INTRAVENOUS
  Filled 2018-08-18 (×4): qty 4

## 2018-08-18 MED ORDER — LORAZEPAM 2 MG/ML IJ SOLN: 1.0000 mg | Freq: Once | INTRAMUSCULAR | Status: DC

## 2018-08-18 MED ORDER — ASPIRIN EC 81 MG PO TBEC
81.0000 mg | DELAYED_RELEASE_TABLET | Freq: Every day | ORAL | Status: DC
  Administered 2018-08-18 – 2018-08-20 (×3): 81 mg via ORAL
  Filled 2018-08-18 (×3): qty 1

## 2018-08-18 MED ORDER — POTASSIUM CHLORIDE CRYS ER 20 MEQ PO TBCR
20.0000 meq | EXTENDED_RELEASE_TABLET | Freq: Two times a day (BID) | ORAL | Status: DC
  Administered 2018-08-19 – 2018-08-20 (×3): 20 meq via ORAL
  Filled 2018-08-18 (×4): qty 1

## 2018-08-18 MED ORDER — HEPARIN SODIUM (PORCINE) 5000 UNIT/ML IJ SOLN
5000.0000 [IU] | Freq: Three times a day (TID) | INTRAMUSCULAR | Status: DC
  Administered 2018-08-20: 5000 [IU] via SUBCUTANEOUS
  Filled 2018-08-18 (×6): qty 1

## 2018-08-18 MED ORDER — LORAZEPAM 2 MG/ML IJ SOLN
0.7500 mg | Freq: Once | INTRAMUSCULAR | Status: AC
  Administered 2018-08-18: 0.75 mg via INTRAVENOUS
  Filled 2018-08-18: qty 1

## 2018-08-18 MED ORDER — NICOTINE 21 MG/24HR TD PT24
21.0000 mg | MEDICATED_PATCH | Freq: Every day | TRANSDERMAL | Status: DC | PRN
  Administered 2018-08-18: 21 mg via TRANSDERMAL
  Filled 2018-08-18: qty 1

## 2018-08-18 NOTE — ED Notes (Signed)
Called report to 3E and they stated pt would be more appropriate for observation unit- after bed pended on [facility] Rn also states not appropriate for 5C.  Charge RN aware

## 2018-08-18 NOTE — Progress Notes (Signed)
CSW familiar with patient who is chronically homeless and per notes, was released from jail on 2/17. On recent admissions- 2/7, 2/3, 1/30 - patient was provided information for: [facility], [facility], AA meetings, and the [employer] [facility] [employer] for follow up. Patient has requested assistance with placement into group home- patient was provided information to obtain FL2 on 2/7 and provided list of group homes/ family care homes in the area to contact.   CSW available to speak with patient if needed.   Stacy Gardner, LCSW Clinical Social Worker  [facility]  [phone]

## 2018-08-18 NOTE — ED Provider Notes (Signed)
[facility] EMERGENCY [facility] Provider Note   CSN: [id number] Arrival date & time: 08/17/18  2238    History   Chief Complaint Chief Complaint  Patient presents with  . Shortness of Breath    HPI [name] is a 58 y.o. male.     Patient seen frequently.  Is homeless history of substance abuse.  Also does have a history of congestive heart failure.  Patient was recently admitted for exacerbation of this on February 7 by the [facility].  Unfortunately patient left AMA.  Patient is now back stating that he short of breath again having increased leg swelling and he will not leave this time.  Also history of hypertension diabetes and history of schizophrenia.  Patient lucid here today following commands.  Patient denies any chest pain.     Past Medical History:  Diagnosis Date  . CHF (congestive heart failure) (HCC)   . Chronic foot pain   . Cocaine abuse (HCC)   . Diabetes mellitus without complication (HCC)   . Hepatitis C   . Homelessness   . Hypertension   . Neuropathy   . Polysubstance abuse (HCC)   . Schizophrenia (HCC)   . Sleep apnea     Patient Active Problem List   Diagnosis Date Noted  . 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 Homelessness 08/08/2018  . Smoker 08/08/2018  . CHF (congestive heart failure) (HCC) 07/15/2018  . Acute exacerbation of CHF (congestive heart failure) (HCC) 03/28/2018  . Prostate enlargement 03/16/2018  . Aortic atherosclerosis (HCC) 03/16/2018  . 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 Chronic foot pain   . Schizoaffective disorder, bipolar type (HCC) 09/30/2016  . Substance induced mood disorder (HCC) 03/13/2015  . Acute kidney failure (HCC) 01/26/2015  . Schizophrenia, paranoid type (HCC) 01/17/2015  . Suicidal ideation   . Drug hallucinosis (HCC) 10/08/2014  . Chronic paranoid schizophrenia (HCC)  09/07/2014  . 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 Urinary retention   . Cocaine use disorder, severe, dependence (HCC)   . Essential hypertension, benign 03/28/2013  . Diabetes mellitus (HCC) 03/15/2013    Past Surgical History:  Procedure Laterality Date  . MULTIPLE TOOTH EXTRACTIONS          Home Medications    Prior to Admission medications   Medication Sig Start Date End Date Taking? Authorizing Provider  amLODipine (NORVASC) 10 MG tablet Take 1 tablet (10 mg total) by mouth daily. Patient not taking: Reported on 08/03/2018 08/03/18   Gilda CreasePollina, Christopher J, MD  aspirin EC 81 MG tablet Take 1 tablet (81 mg total) by mouth daily. 08/09/18 08/09/19  Berton Mountgbata, Sylvester I, MD  atorvastatin (LIPITOR) 40 MG tablet Take 1 tablet (40 mg total) by mouth daily. Patient not taking: Reported on 08/03/2018 08/03/18   Gilda CreasePollina, Christopher J, MD  furosemide (LASIX) 20 MG tablet Take 40 mg oral daily, and 20 mg every evening Patient not taking: Reported on 08/03/2018 08/03/18   Gilda CreasePollina, Christopher J, MD  Insulin Glargine (LANTUS) 100 UNIT/ML Solostar Pen Inject 20 Units into the skin daily. 08/03/18   Gilda CreasePollina, Christopher J, MD  Insulin Pen Needle 29G X 8MM MISC Use with insulin 08/03/18   Pollina, Canary Brimhristopher J, MD  lisinopril (PRINIVIL,ZESTRIL) 5 MG tablet Take 1 tablet (5 mg total) by mouth daily. Patient not taking: Reported on 08/03/2018 08/03/18   Gilda CreasePollina, Christopher J,  MD    Family History Family History  Problem Relation Age of Onset  . Hypertension Other   . Diabetes Other     Social History Social History   Tobacco Use  . Smoking status: Current Every Day Smoker    Packs/day: 1.00    Years: 20.00    Pack years: 20.00    Types: Cigarettes  . Smokeless tobacco: Current User  Substance Use Topics  . Alcohol use: Yes    Comment: Daily Drinker   . Drug use: Yes    Frequency: 7.0 times per week    Types: "Crack"  cocaine, Cocaine, Marijuana    Comment: Cocaine tonight, Marijuana "a long time"     Allergies   Haldol [haloperidol]   Review of Systems Review of Systems  Constitutional: Negative for chills and fever.  HENT: Positive for dental problem. Negative for congestion, rhinorrhea and sore throat.   Eyes: Negative for visual disturbance.  Respiratory: Positive for shortness of breath. Negative for cough.   Cardiovascular: Positive for leg swelling. Negative for chest pain.  Gastrointestinal: Negative for abdominal pain, diarrhea, nausea and vomiting.  Genitourinary: Negative for dysuria.  Musculoskeletal: Negative for back pain and neck pain.  Skin: Negative for rash.  Neurological: Negative for dizziness, light-headedness and headaches.  Hematological: Does not bruise/bleed easily.  Psychiatric/Behavioral: Negative for confusion.     Physical Exam Updated Vital Signs BP (!) 161/117   Pulse 100   Temp 98.2 F (36.8 C) (Oral)   Resp 18   Ht 1.753 m (5\' 9" )   Wt 84 kg   SpO2 95%   BMI 27.35 kg/m   Physical Exam Vitals signs and nursing note reviewed.  Constitutional:      Appearance: He is well-developed.  HENT:     Head: Normocephalic and atraumatic.     Mouth/Throat:     Mouth: Mucous membranes are moist.     Pharynx: Oropharynx is clear.     Comments: Very poor dentition. Eyes:     Extraocular Movements: Extraocular movements intact.     Conjunctiva/sclera: Conjunctivae normal.     Pupils: Pupils are equal, round, and reactive to light.  Neck:     Musculoskeletal: Normal range of motion and neck supple.  Cardiovascular:     Rate and Rhythm: Normal rate and regular rhythm.     Heart sounds: Normal heart sounds. No murmur.  Pulmonary:     Effort: Pulmonary effort is normal. No respiratory distress.     Breath sounds: Normal breath sounds.  Abdominal:     General: Bowel sounds are normal.     Palpations: Abdomen is soft.     Tenderness: There is no abdominal  tenderness.  Musculoskeletal:     Right lower leg: Edema present.     Left lower leg: Edema present.  Skin:    General: Skin is warm and dry.  Neurological:     Mental Status: He is alert.      ED Treatments / Results  Labs (all labs ordered are listed, but only abnormal results are displayed) Labs Reviewed  CBC WITH DIFFERENTIAL/PLATELET - Abnormal; Notable for the following components:      Result Value   Hemoglobin 11.4 (*)    HCT 37.1 (*)    RDW 17.4 (*)    All other components within normal limits  COMPREHENSIVE METABOLIC PANEL - Abnormal; Notable for the following components:   Glucose, Bld 140 (*)    Calcium 8.7 (*)    Total  Protein 6.0 (*)    Albumin 2.5 (*)    All other components within normal limits  BRAIN NATRIURETIC PEPTIDE - Abnormal; Notable for the following components:   B Natriuretic Peptide 1,312.2 (*)    All other components within normal limits  I-STAT TROPONIN, ED    EKG EKG Interpretation  Date/Time:  Monday August 17 2018 22:51:22 EST Ventricular Rate:  97 PR Interval:  130 QRS Duration: 74 QT Interval:  360 QTC Calculation: 457 R Axis:   4 Text Interpretation:  Normal sinus rhythm Anteroseptal infarct , age undetermined Abnormal ECG No significant change since last tracing Confirmed by Vanetta Mulders [phone]) on 08/18/2018 7:21:37 AM   Radiology Dg Chest 2 View  Result Date: 08/17/2018 CLINICAL DATA:  Shortness of breath EXAM: CHEST - 2 VIEW COMPARISON:  08/07/2018, 08/03/2018 FINDINGS: Development of small pleural effusions. Cardiomegaly with vascular congestion. Bilateral interstitial and ground-glass opacity which may reflect edema. Patchy consolidations at both bases. Aortic atherosclerosis. IMPRESSION: 1. Cardiomegaly with vascular congestion and underlying interstitial and ground-glass opacities suspect for edema. There are small pleural effusions 2. Increasing airspace disease at both bases may reflect atelectasis or pneumonia  Electronically Signed   By: [name]   On: 08/17/2018 23:40    Procedures Procedures (including critical care time)  Medications Ordered in ED Medications  furosemide (LASIX) injection 80 mg (80 mg Intravenous Given 08/18/18 0817)  acetaminophen (TYLENOL) tablet 650 mg (650 mg Oral Given 08/18/18 0817)     Initial Impression / Assessment and Plan / ED Course  I have reviewed the triage vital signs and the nursing notes.  Pertinent labs & imaging results that were available during my care of the patient were reviewed by me and considered in my medical decision making (see chart for details).        Patient with admission approximately 10 days ago for exacerbation of his congestive heart failure.  Patient tends to be noncompliant.  Patient sound AMA at that time was admitted by the hospitalist service.  Patient states he will stay today.  Patient presenting with shortness of breath chest x-ray consistent with pulmonary edema also has a bilateral leg swelling.  Patient given 80 of Lasix with good diuresis.  Was feel that based on his BNP still would warrant admission for further diuresis.  Patient states he will not leave AMA this time.  Final Clinical Impressions(s) / ED Diagnoses   Final diagnoses:  Acute on chronic systolic congestive heart failure [facility])    ED Discharge Orders    None       Vanetta Mulders, MD 08/18/18 1102

## 2018-08-18 NOTE — Care Management Note (Signed)
Case Management Note  Patient Details  Name: [name] MRN: [id number] Date of Birth: 29-Apr-1961  Subjective/Objective:     CHF              Action/Plan: Patient admitted from [place]; PCP: [name], [name], NP; has private insurance with Medicaid with prescription drug coverage; SW following/ knows patient from previous admission; he has list of group homes and shelters in the area. Patient does have an bad temperament at times. CM following for progression of care.  Expected Discharge Date:   possibly 08/19/2018               Expected Discharge Plan:  Home/Self Care  In-House Referral:  Clinical Social Work  Discharge planning Services  CM Consult     Status of Service:  In process, will continue to follow  Reola Mosher [phone] 08/18/2018, 3:04 PM

## 2018-08-18 NOTE — ED Notes (Signed)
CM breakfast tray ordered

## 2018-08-18 NOTE — H&P (Signed)
History and Physical    [name] [id number] DOB: 1960-10-23 DOA: 08/17/2018  PCP: Patient, No Pcp Per   [facility]  I have personally briefly [facility]  Chief Complaint: Shortness of breath.  HPI: Taylor MemosFrederick L [name] is a 58 y.o. male with medical history significant of chronic combined systolic and diastolic heart failure, chronic foot pain, cocaine abuse, substance abuse, schizophrenia, type 2 diabetes, hepatitis C, homelessness, hypertension, peripheral neuropathy, sleep apnea not on CPAP who is coming to the emergency department via EMS from jail with complaints of progressively worse dyspnea associated with what the patient describes as a swollen throat.  He denies fever, chills, rhinorrhea, he gets frequent productive cough due to smoking.  He denies wheezing or hemoptysis.  No chest pain, nausea or emesis, palpitations, dizziness, diaphoresis, PND, orthopnea, but complains of pitting edema of the lower extremities.  He denies abdominal pain, diarrhea, constipation, melena or hematochezia.  No dysuria, frequency or hematuria.  No polyuria, polydipsia, polyphagia or blurred vision.  The patient has not used his medications in some time.  He is not using his Lantus insulin.  He has been recently admitted twice more in the previous 3 weeks due to dyspnea secondary to congestive heart failure exacerbation.  ED Course: Initial vital signs temperature in 98.2 F, pulse 99, respirations 20, blood pressure 175/105 mmHg and O2 sat 95% on room air.  The patient received supplemental oxygen and 80 mg of furosemide IVP x1 in the emergency department.  I added K. Dur 40 mEq p.o. x1 dose.  His urine toxicology was negative.  Urinalysis was unremarkable.  White count 7.3, hemoglobin 11.4 g/dL and platelets 811269.  I-STAT troponin was 0.06 ng/mL.  BNP was 1312.2 pg/mL.  CMP shows normal electrolytes when calcium  is corrected to albumin.  Normal renal function.  Glucose 140 mg/dL.  Total protein is 6.0 and albumin 2.5 g/dL.  The rest of the hepatic functions are within normal limits.  His chest radiograph shows cardiomegaly with vascular congestion underlying interstitial lung edema.  Please see images and full radiology report for further detail.  Review of Systems: As per HPI otherwise 10 point review of systems negative.   Past Medical History:  Diagnosis Date  . CHF (congestive heart failure) (HCC)   . Chronic foot pain   . Cocaine abuse (HCC)   . Diabetes mellitus without complication (HCC)   . Hepatitis C   . Homelessness   . Hypertension   . Neuropathy   . Polysubstance abuse (HCC)   . Schizophrenia (HCC)   . Sleep apnea     Past Surgical History:  Procedure Laterality Date  . MULTIPLE TOOTH EXTRACTIONS       reports that he has been smoking cigarettes. He has a 20.00 pack-year smoking history. He uses smokeless tobacco. He reports current alcohol use. He reports current drug use. Frequency: 7.00 times per week. Drugs: "Crack" cocaine, Cocaine, and Marijuana.  Allergies  Allergen Reactions  . Haldol [Haloperidol] Other (See Comments)    Muscle spasms, loss of voluntary movement. However, pt has taken Thorazine on multiple occasions with no adverse effects.     Family History  Problem Relation Age of Onset  . Hypertension Other   . Diabetes Other    Prior to Admission medications   Medication Sig Start Date End Date Taking? Authorizing Provider  Insulin Glargine (LANTUS) 100 UNIT/ML Solostar Pen Inject 20 Units into the  skin daily. 08/03/18  Yes Pollina, Canary Brimhristopher J, MD  amLODipine (NORVASC) 10 MG tablet Take 1 tablet (10 mg total) by mouth daily. Patient not taking: Reported on 08/03/2018 08/03/18   Gilda CreasePollina, Christopher J, MD  aspirin EC 81 MG tablet Take 1 tablet (81 mg total) by mouth daily. Patient not taking: Reported on 08/18/2018 08/09/18 08/09/19  Berton Mountgbata, Sylvester I, MD    atorvastatin (LIPITOR) 40 MG tablet Take 1 tablet (40 mg total) by mouth daily. Patient not taking: Reported on 08/03/2018 08/03/18   Gilda CreasePollina, Christopher J, MD  furosemide (LASIX) 20 MG tablet Take 40 mg oral daily, and 20 mg every evening Patient not taking: Reported on 08/03/2018 08/03/18   Gilda CreasePollina, Christopher J, MD  Insulin Pen Needle 29G X 8MM MISC Use with insulin 08/03/18   Pollina, Canary Brimhristopher J, MD  lisinopril (PRINIVIL,ZESTRIL) 5 MG tablet Take 1 tablet (5 mg total) by mouth daily. Patient not taking: Reported on 08/03/2018 08/03/18   Gilda CreasePollina, Christopher J, MD    Physical Exam: Vitals:   08/17/18 2236 08/18/18 0313 08/18/18 0715 08/18/18 1015  BP:  (!) 161/92 (!) 161/117 (!) 163/97  Pulse:  (!) 103 100 99  Resp:  (!) 24 18 16   Temp:      TempSrc:      SpO2:  96% 95% 95%  Weight: 84 kg     Height: 5\' 9"  (1.753 m)       Constitutional: Disheveled, but in NAD, calm, comfortable Eyes: PERRL, lids and conjunctivae normal ENMT: Mucous membranes are moist. Posterior pharynx clear of any exudate or lesions. Mostly absent or fracture teeth with poor state of repair/hygiene.  Neck: normal, supple, no masses, no thyromegaly Respiratory: Bibasilar rales, no wheezing.. Normal respiratory effort. No accessory muscle use.  Cardiovascular: Tachycardic at 103 bpm, no murmurs / rubs / gallops.  2+ lower extremities pitting edema. 2+ pedal pulses. No carotid bruits.  Abdomen: Soft, no tenderness, no masses palpated. No hepatosplenomegaly. Bowel sounds positive.  GU: Mild scrotal swelling.  musculoskeletal: no clubbing / cyanosis. Good ROM, no contractures. Normal muscle tone.  Skin: no rashes, lesions, ulcers on limited examination. Neurologic: CN 2-12 grossly intact. Sensation intact, DTR normal. Strength 5/5 in all 4.  Psychiatric:  Alert and oriented x 3.  Mildly anxious mood.   Labs on Admission: I have personally reviewed following labs and imaging studies  CBC: Recent Labs  Lab  08/18/18 0756  WBC 7.3  NEUTROABS 5.7  HGB 11.4*  HCT 37.1*  MCV 85.9  PLT 269   Basic Metabolic Panel: Recent Labs  Lab 08/18/18 0756  NA 140  K 3.6  CL 108  CO2 25  GLUCOSE 140*  BUN 10  CREATININE 0.96  CALCIUM 8.7*   GFR: Estimated Creatinine Clearance: 84.9 mL/min (by C-G formula based on SCr of 0.96 mg/dL). Liver Function Tests: Recent Labs  Lab 08/18/18 0756  AST 23  ALT 17  ALKPHOS 79  BILITOT 0.6  PROT 6.0*  ALBUMIN 2.5*   No results for input(s): LIPASE, AMYLASE in the last 168 hours. No results for input(s): AMMONIA in the last 168 hours. Coagulation Profile: No results for input(s): INR, PROTIME in the last 168 hours. Cardiac Enzymes: No results for input(s): CKTOTAL, CKMB, CKMBINDEX, TROPONINI in the last 168 hours. BNP (last 3 results) No results for input(s): PROBNP in the last 8760 hours. HbA1C: No results for input(s): HGBA1C in the last 72 hours. CBG: Recent Labs  Lab 08/14/18 0304 08/14/18 0736  GLUCAP 297*  188*   Lipid Profile: No results for input(s): CHOL, HDL, LDLCALC, TRIG, CHOLHDL, LDLDIRECT in the last 72 hours. Thyroid Function Tests: No results for input(s): TSH, T4TOTAL, FREET4, T3FREE, THYROIDAB in the last 72 hours. Anemia Panel: No results for input(s): VITAMINB12, FOLATE, FERRITIN, TIBC, IRON, RETICCTPCT in the last 72 hours. Urine analysis:    Component Value Date/Time   COLORURINE COLORLESS (A) 08/18/2018 1102   APPEARANCEUR CLEAR 08/18/2018 1102   LABSPEC 1.004 (L) 08/18/2018 1102   PHURINE 8.0 08/18/2018 1102   GLUCOSEU NEGATIVE 08/18/2018 1102   HGBUR NEGATIVE 08/18/2018 1102   BILIRUBINUR NEGATIVE 08/18/2018 1102   KETONESUR NEGATIVE 08/18/2018 1102   PROTEINUR NEGATIVE 08/18/2018 1102   UROBILINOGEN 1.0 03/14/2015 0610   NITRITE NEGATIVE 08/18/2018 1102   LEUKOCYTESUR NEGATIVE 08/18/2018 1102    Radiological Exams on Admission: Dg Chest 2 View  Result Date: 08/17/2018 CLINICAL DATA:  Shortness of  breath EXAM: CHEST - 2 VIEW COMPARISON:  08/07/2018, 08/03/2018 FINDINGS: Development of small pleural effusions. Cardiomegaly with vascular congestion. Bilateral interstitial and ground-glass opacity which may reflect edema. Patchy consolidations at both bases. Aortic atherosclerosis. IMPRESSION: 1. Cardiomegaly with vascular congestion and underlying interstitial and ground-glass opacities suspect for edema. There are small pleural effusions 2. Increasing airspace disease at both bases may reflect atelectasis or pneumonia Electronically Signed   By: [name]   On: 08/17/2018 23:40   03/16/2018 echocardiogram complete ------------------------------------------------------------------- LV EF: 30% -   35%  ------------------------------------------------------------------- Indications:      429.3 Cardiomegaly.  ------------------------------------------------------------------- History:   PMH:  Drug overdose. Schizoaffective disorder. Cocaine abuse.  Congestive heart failure.  ------------------------------------------------------------------- Study Conclusions  - Left ventricle: The cavity size was normal. There was severe   concentric hypertrophy. Systolic function was moderately to   severely reduced. The estimated ejection fraction was in the   range of 30% to 35%. There is hypokinesis of the inferoseptal   myocardium. Doppler parameters are consistent with a reversible   restrictive pattern, indicative of decreased left ventricular   diastolic compliance and/or increased left atrial pressure (grade   3 diastolic dysfunction). - Aortic valve: Trileaflet; mildly thickened, mildly calcified   leaflets. There was mild regurgitation. - Mitral valve: There was moderate regurgitation. - Left atrium: The atrium was severely dilated.  Impressions:  - EF is reduced when compared to prior.  EKG: Independently reviewed. Vent. rate 97 BPM PR interval 130 ms QRS duration 74  ms QT/QTc 360/457 ms P-R-T axes 59 4 -15 Normal sinus rhythm Anteroseptal infarct , age undetermined Abnormal ECG  Assessment/Plan Principal Problem:   Acute on chronic combined systolic and diastolic congestive heart failure (HCC) Observation/telemetry. Continue supplemental oxygen. Monitor intake and output. Monitor daily weights. Fluid restriction to 1200 mL/24 hr. Resume lisinopril 5 mg p.o. daily. Furosemide 40 mg IVP twice daily. Supplement potassium. Monitor renal function and electrolytes.  Active Problems:   Essential hypertension, benign Resume lisinopril 5 mg p.o. daily. Currently on furosemide IV. Monitor BP, renal function and electrolytes. Resume amlodipine if blood pressure elevation persists.    Chronic paranoid schizophrenia (HCC) Currently not taking medications. Awake, alert and oriented x3. Anxious and restless at times. Denies suicidal or homicidal thoughts.    Type 2 diabetes mellitus (HCC) Carbohydrate modified diet. The patient was insistent on having double portions. I told the patient that he could have snacks, but no double portions due to DM. He is currently not using Lantus, so will not resume it unless BG increases. CBG monitoring with  regular insulin sliding scale before meals.    Homelessness Social services have been consulted.    Tobacco use Nicotine replacement therapy ordered.   DVT prophylaxis: Heparin SQ. Code Status: Full code. Family Communication: Disposition Plan: 24 to 48 hours treatment for combined systolic/diastolic CHF. Consults called: Admission status: Observation/medical telemetry.   Bobette Mo MD [facility]  08/18/2018, 11:42 AM   This document was prepared using Dragon voice recognition software and may contain some unintended transcription errors.

## 2018-08-18 NOTE — ED Notes (Signed)
Pt ambulated to RR with steady gait

## 2018-08-18 NOTE — ED Notes (Signed)
[name] (SR)Lunch Tray Ordered-per RN-called by [name]   

## 2018-08-18 NOTE — Progress Notes (Signed)
Dr. Robb Matar paged to make him aware of the nursing staff's concerns with patient in the hopes of establishing some form of behavioral agreement during this admission. AC and security has been made aware of his presence and has been asked to round on the department throughout the day and night to ensure staff safety.

## 2018-08-19 ENCOUNTER — Encounter (HOSPITAL_COMMUNITY): Payer: Self-pay | Admitting: General Practice

## 2018-08-19 DIAGNOSIS — G8929 Other chronic pain: Secondary | ICD-10-CM | POA: Diagnosis present

## 2018-08-19 DIAGNOSIS — Z888 Allergy status to other drugs, medicaments and biological substances status: Secondary | ICD-10-CM | POA: Diagnosis not present

## 2018-08-19 DIAGNOSIS — I5043 Acute on chronic combined systolic (congestive) and diastolic (congestive) heart failure: Secondary | ICD-10-CM | POA: Diagnosis present

## 2018-08-19 DIAGNOSIS — M79673 Pain in unspecified foot: Secondary | ICD-10-CM | POA: Diagnosis present

## 2018-08-19 DIAGNOSIS — Z8619 Personal history of other infectious and parasitic diseases: Secondary | ICD-10-CM | POA: Diagnosis not present

## 2018-08-19 DIAGNOSIS — F17210 Nicotine dependence, cigarettes, uncomplicated: Secondary | ICD-10-CM | POA: Diagnosis present

## 2018-08-19 DIAGNOSIS — I110 Hypertensive heart disease with heart failure: Secondary | ICD-10-CM | POA: Diagnosis present

## 2018-08-19 DIAGNOSIS — Z833 Family history of diabetes mellitus: Secondary | ICD-10-CM | POA: Diagnosis not present

## 2018-08-19 DIAGNOSIS — F1410 Cocaine abuse, uncomplicated: Secondary | ICD-10-CM | POA: Diagnosis present

## 2018-08-19 DIAGNOSIS — Z79899 Other long term (current) drug therapy: Secondary | ICD-10-CM | POA: Diagnosis not present

## 2018-08-19 DIAGNOSIS — Z7982 Long term (current) use of aspirin: Secondary | ICD-10-CM | POA: Diagnosis not present

## 2018-08-19 DIAGNOSIS — F200 Paranoid schizophrenia: Secondary | ICD-10-CM | POA: Diagnosis present

## 2018-08-19 DIAGNOSIS — Z590 Homelessness: Secondary | ICD-10-CM | POA: Diagnosis not present

## 2018-08-19 DIAGNOSIS — G4730 Sleep apnea, unspecified: Secondary | ICD-10-CM | POA: Diagnosis present

## 2018-08-19 DIAGNOSIS — Z9119 Patient's noncompliance with other medical treatment and regimen: Secondary | ICD-10-CM | POA: Diagnosis not present

## 2018-08-19 DIAGNOSIS — Z794 Long term (current) use of insulin: Secondary | ICD-10-CM | POA: Diagnosis not present

## 2018-08-19 DIAGNOSIS — I5023 Acute on chronic systolic (congestive) heart failure: Secondary | ICD-10-CM | POA: Diagnosis present

## 2018-08-19 DIAGNOSIS — Z8249 Family history of ischemic heart disease and other diseases of the circulatory system: Secondary | ICD-10-CM | POA: Diagnosis not present

## 2018-08-19 DIAGNOSIS — E119 Type 2 diabetes mellitus without complications: Secondary | ICD-10-CM | POA: Diagnosis present

## 2018-08-19 LAB — BASIC METABOLIC PANEL
Anion gap: 7 (ref 5–15)
BUN: 14 mg/dL (ref 6–20)
CO2: 26 mmol/L (ref 22–32)
CREATININE: 1.05 mg/dL (ref 0.61–1.24)
Calcium: 8.6 mg/dL — ABNORMAL LOW (ref 8.9–10.3)
Chloride: 106 mmol/L (ref 98–111)
GFR calc Af Amer: >60 mL/min (ref >60)
GFR calc non Af Amer: >60 mL/min (ref >60)
GLUCOSE: 181 mg/dL — AB (ref 70–99)
Potassium: 3.8 mmol/L (ref 3.5–5.1)
Sodium: 139 mmol/L (ref 135–145)

## 2018-08-19 LAB — GLUCOSE, CAPILLARY
GLUCOSE-CAPILLARY: 218 mg/dL — AB (ref 70–99)
Glucose-Capillary: 196 mg/dL — ABNORMAL HIGH (ref 70–99)
Glucose-Capillary: 141 mg/dL — ABNORMAL HIGH (ref 70–99)
Glucose-Capillary: 258 mg/dL — ABNORMAL HIGH (ref 70–99)

## 2018-08-19 MED ORDER — ZOLPIDEM TARTRATE 5 MG PO TABS
5.0000 mg | ORAL_TABLET | Freq: Once | ORAL | Status: AC
  Administered 2018-08-19: 5 mg via ORAL
  Filled 2018-08-19: qty 1

## 2018-08-19 NOTE — Clinical Social Work Note (Signed)
CSW acknowledges homeless consult. This CSW has provided patient with shelter and community twice in the past month and other social workers from [employer] and the ED have provided patient with resources as well. See CSW note from 2/18.   Charlynn Court, CSW [phone]

## 2018-08-19 NOTE — Progress Notes (Addendum)
PROGRESS NOTE    LE CLOUGHERTY  NKN:[id number] DOB: April 23, 1961 DOA: 08/17/2018 PCP: Patient, No Pcp Per  Outpatient Specialists:   Brief Narrative:  [name] is a 58 y.o. male with medical history significant of chronic combined systolic and diastolic heart failure (EF of 30 to 35% with grade 3 diastolic dysfunction), chronic foot pain, cocaine abuse, substance abuse, schizophrenia, type 2 diabetes, hepatitis C, homelessness, hypertension, peripheral neuropathy and sleep apnea not on CPAP.  Patient [facility] the ER from jail with complaints of progressive worsening dyspnea, associated with what the patient describes as a swollen throat.  Patient is currently on treatment for CHF exacerbation.  Patient seems to be improving.  Hopefully, patient be discharged in the next 1 to 2 days.  Assessment & Plan:   Principal Problem:   Acute on chronic combined systolic and diastolic congestive heart failure (HCC) Active Problems:   Type 2 diabetes mellitus (HCC)   Essential hypertension, benign   Chronic paranoid schizophrenia (HCC)   Homelessness   Tobacco use  Acute on chronic combined systolic and diastolic congestive heart failure (HCC) Observation/telemetry. Continue supplemental oxygen. Monitor intake and output. Monitor daily weights. Fluid restriction to 1200 mL/24 hr. Resume lisinopril 5 mg p.o. daily. Furosemide 40 mg IVP twice daily. Supplement potassium. Monitor renal function and electrolytes. 08/19/2018: Continue above management.  Patient is improving.  Likely discharge in the next 1 to 2 days.  Essential hypertension, benign Resume lisinopril 5 mg p.o. daily. Currently on furosemide IV. Monitor BP, renal function and electrolytes. Resume amlodipine if blood pressure elevation persists. 08/19/2018: Continue to monitor.  Blood pressure today is 154/101 mmHg.  Chronic paranoid schizophrenia (HCC) Currently not taking medications. Awake, alert and  oriented x3. Anxious and restless at times. Denies suicidal or homicidal thoughts. 08/19/2018: This is stable.  Type 2 diabetes mellitus (HCC) Carbohydrate modified diet. The patient was insistent on having double portions. I told the patient that he could have snacks, but no double portions due to DM. He is currently not using Lantus, so will not resume it unless BG increases. CBG monitoring with regular insulin sliding scale before meals. 08/19/2018: Continue to optimize.  Homelessness Social services have been consulted.  Tobacco use Nicotine replacement therapy ordered.   DVT prophylaxis: Heparin SQ. Code Status: Full code. Family Communication: Disposition Plan: 24 to 48 hours treatment for combined systolic/diastolic CHF.       Consultants:   None  Procedures:   None  Antimicrobials:   None   Subjective: No new complaints. Shortness of breath is improving. Dizziness improving. Denies swollen throat today. Leg edema is improving.  Objective: Vitals:   08/18/18 1921 08/19/18 0500 08/19/18 0631 08/19/18 0821  BP: (!) 147/85  (!) 166/93 (!) 154/101  Pulse: (!) 104  99 100  Resp: 18  18   Temp: 98.3 F (36.8 C)  97.6 F (36.4 C)   TempSrc: Oral  Oral   SpO2: 91%  96% 100%  Weight:  84.5 kg    Height:        Intake/Output Summary (Last 24 hours) at 08/19/2018 1133 Last data filed at 08/19/2018 0900 Gross per 24 hour  Intake 1800 ml  Output 5250 ml  Net -3450 ml   Filed Weights   08/17/18 2236 08/18/18 1405 08/19/18 0500  Weight: 84 kg 88.1 kg 84.5 kg    Examination:  General exam: Appears calm and comfortable  Respiratory system: Clear to auscultation. Respiratory effort normal. Cardiovascular system: S1 & S2 .  Mild pedal edema. Gastrointestinal system: Abdomen is nondistended, soft and nontender. No organomegaly or masses felt. Normal bowel sounds heard. Central nervous system: Alert and oriented. No focal neurological  deficits. Extremities: Mild leg edema.  Data Reviewed: I have personally reviewed following labs and imaging studies  CBC: Recent Labs  Lab 08/18/18 0756  WBC 7.3  NEUTROABS 5.7  HGB 11.4*  HCT 37.1*  MCV 85.9  PLT 269   Basic Metabolic Panel: Recent Labs  Lab 08/18/18 0756 08/19/18 0317  NA 140 139  K 3.6 3.8  CL 108 106  CO2 25 26  GLUCOSE 140* 181*  BUN 10 14  CREATININE 0.96 1.05  CALCIUM 8.7* 8.6*  MG 2.0  --    GFR: Estimated Creatinine Clearance: 77.6 mL/min (by C-G formula based on SCr of 1.05 mg/dL). Liver Function Tests: Recent Labs  Lab 08/18/18 0756  AST 23  ALT 17  ALKPHOS 79  BILITOT 0.6  PROT 6.0*  ALBUMIN 2.5*   No results for input(s): LIPASE, AMYLASE in the last 168 hours. No results for input(s): AMMONIA in the last 168 hours. Coagulation Profile: No results for input(s): INR, PROTIME in the last 168 hours. Cardiac Enzymes: Recent Labs  Lab 08/18/18 1414 08/18/18 2039  TROPONINI 0.03* 0.04*   BNP (last 3 results) No results for input(s): PROBNP in the last 8760 hours. HbA1C: No results for input(s): HGBA1C in the last 72 hours. CBG: Recent Labs  Lab 08/14/18 0304 08/14/18 0736 08/18/18 1625 08/18/18 2129 08/19/18 0734  GLUCAP 297* 188* 151* 161* 196*   Lipid Profile: No results for input(s): CHOL, HDL, LDLCALC, TRIG, CHOLHDL, LDLDIRECT in the last 72 hours. Thyroid Function Tests: No results for input(s): TSH, T4TOTAL, FREET4, T3FREE, THYROIDAB in the last 72 hours. Anemia Panel: No results for input(s): VITAMINB12, FOLATE, FERRITIN, TIBC, IRON, RETICCTPCT in the last 72 hours. Urine analysis:    Component Value Date/Time   COLORURINE COLORLESS (A) 08/18/2018 1102   APPEARANCEUR CLEAR 08/18/2018 1102   LABSPEC 1.004 (L) 08/18/2018 1102   PHURINE 8.0 08/18/2018 1102   GLUCOSEU NEGATIVE 08/18/2018 1102   HGBUR NEGATIVE 08/18/2018 1102   BILIRUBINUR NEGATIVE 08/18/2018 1102   KETONESUR NEGATIVE 08/18/2018 1102    PROTEINUR NEGATIVE 08/18/2018 1102   UROBILINOGEN 1.0 03/14/2015 0610   NITRITE NEGATIVE 08/18/2018 1102   LEUKOCYTESUR NEGATIVE 08/18/2018 1102   Sepsis Labs: @LABRCNTIP (procalcitonin:4,lacticidven:4)  )No results found for this or any previous visit (from the past 240 hour(s)).       Radiology Studies: Dg Chest 2 View  Result Date: 08/17/2018 CLINICAL DATA:  Shortness of breath EXAM: CHEST - 2 VIEW COMPARISON:  08/07/2018, 08/03/2018 FINDINGS: Development of small pleural effusions. Cardiomegaly with vascular congestion. Bilateral interstitial and ground-glass opacity which may reflect edema. Patchy consolidations at both bases. Aortic atherosclerosis. IMPRESSION: 1. Cardiomegaly with vascular congestion and underlying interstitial and ground-glass opacities suspect for edema. There are small pleural effusions 2. Increasing airspace disease at both bases may reflect atelectasis or pneumonia Electronically Signed   By: [name]   On: 08/17/2018 23:40        Scheduled Meds: . amLODipine  10 mg Oral Daily  . aspirin EC  81 mg Oral Daily  . furosemide  40 mg Intravenous Q12H  . heparin  5,000 Units Subcutaneous Q8H  . insulin aspart  0-15 Units Subcutaneous TID WC  . lisinopril  5 mg Oral Daily  . potassium chloride  20 mEq Oral BID   Continuous Infusions:  LOS: 0 days    Time spent: 35 minutes.   Berton MountSylvester Girl Schissler, MD  [facility] Pager #: [phone] 7PM-7AM contact night coverage as above

## 2018-08-20 LAB — GLUCOSE, CAPILLARY: Glucose-Capillary: 176 mg/dL — ABNORMAL HIGH (ref 70–99)

## 2018-08-20 LAB — RENAL FUNCTION PANEL
Albumin: 2.3 g/dL — ABNORMAL LOW (ref 3.5–5.0)
Anion gap: 9 (ref 5–15)
BUN: 18 mg/dL (ref 6–20)
CO2: 25 mmol/L (ref 22–32)
Calcium: 8.7 mg/dL — ABNORMAL LOW (ref 8.9–10.3)
Chloride: 104 mmol/L (ref 98–111)
Creatinine, Ser: 1.13 mg/dL (ref 0.61–1.24)
GFR calc Af Amer: >60 mL/min (ref >60)
GFR calc non Af Amer: >60 mL/min (ref >60)
Glucose, Bld: 167 mg/dL — ABNORMAL HIGH (ref 70–99)
Phosphorus: 3.8 mg/dL (ref 2.5–4.6)
Potassium: 3.6 mmol/L (ref 3.5–5.1)
Sodium: 138 mmol/L (ref 135–145)

## 2018-08-20 LAB — PHOSPHORUS: Phosphorus: 4.2 mg/dL (ref 2.5–4.6)

## 2018-08-20 LAB — MAGNESIUM: Magnesium: 2 mg/dL (ref 1.7–2.4)

## 2018-08-20 NOTE — Progress Notes (Signed)
Pts family brought in a meal from [facility].

## 2018-08-20 NOTE — Progress Notes (Signed)
Patient called to nurses station from room demanding discharge and prescriptions stating that if he did not get them he was going to tear the room apart. Nursing Leadership accompanied by security went to the patient's room to speak with him. The patient was made aware that the physician had not written discharge orders and that nurse can not provide prescriptions. He was also informed that his doctor had been contacted by the nurse and made aware of his desire to be discharged. The patient stated that he was not waiting and that he would just sign himself out against medical advice. He request the document and then he signed. Patient's IV and telemetry was removed and the patient was given privacy to get dressed. He was also given a bus pass.

## 2018-08-20 NOTE — Progress Notes (Signed)
Patient leaving AMA, MD paged. Patient keeps saying he was told he could leave.

## 2018-08-20 NOTE — Discharge Summary (Signed)
Physician Discharge Summary  Patient ID: [name] MRN: [id number] DOB/AGE: Feb 15, 1961 58 y.o.  Admit date: 08/17/2018 Discharge date: 08/20/2018  Admission Diagnoses:  Discharge Diagnoses:  Principal Problem:   Acute on chronic combined systolic and diastolic congestive heart failure (HCC) Active Problems:   Type 2 diabetes mellitus (HCC)   Essential hypertension, benign   Chronic paranoid schizophrenia (HCC)   Homelessness   Tobacco use  Hospital Course: Taylor Kellen Whitsettis a 59 y.o.malewith medical history significant ofchronic combined systolic and diastolic heart failure (EF of 30 to 35% with grade 3 diastolic dysfunction), chronic foot pain, cocaine abuse, substance abuse, schizophrenia, type 2 diabetes, hepatitis C, homelessness, hypertension, peripheral neuropathy and sleep apnea not on CPAP.  Patient presented to the ER from jail with complaints of progressive worsening dyspnea, associated with what the patient describes asaswollen throat.  Patient was admitted and treated for CHF exacerbation.  Can improve significantly, but patient signed himself out of the hospital AGAINST MEDICAL ADVICE.    Consults: None  Disposition: Patient signed himself out of the hospital AGAINST MEDICAL ADVICE.   Allergies as of 08/20/2018      Reactions   Haldol [haloperidol] Other (See Comments)   Muscle spasms, loss of voluntary movement. However, pt has taken Thorazine on multiple occasions with no adverse effects.       Medication List    ASK your doctor about these medications   amLODipine 10 MG tablet Commonly known as:  NORVASC Take 1 tablet (10 mg total) by mouth daily.   aspirin EC 81 MG tablet Take 1 tablet (81 mg total) by mouth daily.   atorvastatin 40 MG tablet Commonly known as:  LIPITOR Take 1 tablet (40 mg total) by mouth daily.   furosemide 20 MG tablet Commonly known as:  LASIX Take 40 mg oral daily, and 20 mg every evening   Insulin Glargine  100 UNIT/ML Solostar Pen Commonly known as:  LANTUS Inject 20 Units into the skin daily.   Insulin Pen Needle 29G X Misc Use with insulin   lisinopril 5 MG tablet Commonly known as:  PRINIVIL,ZESTRIL Take 1 tablet (5 mg total) by mouth daily.        SignedBarnetta Chapel 08/20/2018, 6:11 PM

## 2018-08-20 NOTE — Progress Notes (Signed)
Inpatient Diabetes Program Recommendations  AACE/ADA: New Consensus Statement on Inpatient Glycemic Control (2015)  Target Ranges:  Prepandial:   less than 140 mg/dL      Peak postprandial:   less than 180 mg/dL (1-2 hours)      Critically ill patients:  140 - 180 mg/dL   Lab Results  Component Value Date   GLUCAP 176 (H) 08/20/2018   HGBA1C 7.3 (H) 07/18/2018    Review of Glycemic Control Results for KEPLER, GIANINO (MRN [id number]) as of 08/20/2018 11:04  Ref. Range 08/19/2018 07:34 08/19/2018 11:52 08/19/2018 16:32 08/19/2018 21:33 08/20/2018 07:44  Glucose-Capillary Latest Ref Range: 70 - 99 mg/dL 244 (H) 975 (H) 300 (H) 258 (H) 176 (H)   Diabetes history: DM 2 Outpatient Diabetes medications:  Lantus 20 units daily Current orders for Inpatient glycemic control:  Novolog moderate tid with meals Inpatient Diabetes Program Recommendations:   Please consider adding Lantus 10 units daily.   Thanks,  Beryl Meager, RN, BC-ADM Inpatient Diabetes Coordinator Pager [phone] (8a-5p)

## 2018-08-20 NOTE — Plan of Care (Signed)
?  Problem: Clinical Measurements: ?Goal: Ability to maintain clinical measurements within normal limits will improve ?Outcome: Progressing ?  ?Problem: Activity: ?Goal: Risk for activity intolerance will decrease ?Outcome: Progressing ?  ?Problem: Nutrition: ?Goal: Adequate nutrition will be maintained ?Outcome: Progressing ?  ?Problem: Pain Managment: ?Goal: General experience of comfort will improve ?Outcome: Progressing ?  ?

## 2018-08-20 NOTE — Plan of Care (Signed)
?  Problem: Activity: ?Goal: Capacity to carry out activities will improve ?Outcome: Progressing ?  ?

## 2018-08-22 ENCOUNTER — Other Ambulatory Visit: Payer: Self-pay

## 2018-08-22 ENCOUNTER — Encounter (HOSPITAL_COMMUNITY): Payer: Self-pay

## 2018-08-22 ENCOUNTER — Emergency Department (HOSPITAL_COMMUNITY): Payer: Medicaid Other

## 2018-08-22 ENCOUNTER — Emergency Department (HOSPITAL_COMMUNITY)
Admission: EM | Admit: 2018-08-22 | Discharge: 2018-08-23 | Disposition: A | Discharge status: Home / Self Care | Payer: Medicaid Other | Attending: Emergency Medicine | Admitting: Emergency Medicine

## 2018-08-22 DIAGNOSIS — E119 Type 2 diabetes mellitus without complications: Secondary | ICD-10-CM | POA: Insufficient documentation

## 2018-08-22 DIAGNOSIS — I5043 Acute on chronic combined systolic (congestive) and diastolic (congestive) heart failure: Secondary | ICD-10-CM | POA: Insufficient documentation

## 2018-08-22 DIAGNOSIS — Z794 Long term (current) use of insulin: Secondary | ICD-10-CM | POA: Diagnosis not present

## 2018-08-22 DIAGNOSIS — R4589 Other symptoms and signs involving emotional state: Secondary | ICD-10-CM

## 2018-08-22 DIAGNOSIS — F1420 Cocaine dependence, uncomplicated: Secondary | ICD-10-CM | POA: Diagnosis not present

## 2018-08-22 DIAGNOSIS — Z79899 Other long term (current) drug therapy: Secondary | ICD-10-CM | POA: Diagnosis not present

## 2018-08-22 DIAGNOSIS — F209 Schizophrenia, unspecified: Secondary | ICD-10-CM | POA: Insufficient documentation

## 2018-08-22 DIAGNOSIS — Z7982 Long term (current) use of aspirin: Secondary | ICD-10-CM | POA: Diagnosis not present

## 2018-08-22 DIAGNOSIS — I110 Hypertensive heart disease with heart failure: Secondary | ICD-10-CM | POA: Diagnosis not present

## 2018-08-22 DIAGNOSIS — F1410 Cocaine abuse, uncomplicated: Secondary | ICD-10-CM

## 2018-08-22 LAB — BRAIN NATRIURETIC PEPTIDE: B NATRIURETIC PEPTIDE 5: 718.8 pg/mL — AB (ref 0.0–100.0)

## 2018-08-22 LAB — ACETAMINOPHEN LEVEL: Acetaminophen (Tylenol), Serum: <10 ug/mL — ABNORMAL LOW (ref 10–30)

## 2018-08-22 LAB — CBC WITH DIFFERENTIAL/PLATELET
ABS IMMATURE GRANULOCYTES: 0.02 10*3/uL (ref 0.00–0.07)
Basophils Absolute: 0.1 10*3/uL (ref 0.0–0.1)
Basophils Relative: 1 %
Eosinophils Absolute: 0 10*3/uL (ref 0.0–0.5)
Eosinophils Relative: 1 %
HCT: 37.4 % — ABNORMAL LOW (ref 39.0–52.0)
Hemoglobin: 11.2 g/dL — ABNORMAL LOW (ref 13.0–17.0)
Immature Granulocytes: 0 %
Lymphocytes Relative: 16 %
Lymphs Abs: 1 10*3/uL (ref 0.7–4.0)
MCH: 25.8 pg — AB (ref 26.0–34.0)
MCHC: 29.9 g/dL — AB (ref 30.0–36.0)
MCV: 86.2 fL (ref 80.0–100.0)
Monocytes Absolute: 0.5 10*3/uL (ref 0.1–1.0)
Monocytes Relative: 8 %
Neutro Abs: 4.7 10*3/uL (ref 1.7–7.7)
Neutrophils Relative %: 74 %
Platelets: 290 10*3/uL (ref 150–400)
RBC: 4.34 MIL/uL (ref 4.22–5.81)
RDW: 17 % — ABNORMAL HIGH (ref 11.5–15.5)
WBC: 6.4 10*3/uL (ref 4.0–10.5)
nRBC: 0 % (ref 0.0–0.2)

## 2018-08-22 LAB — RAPID URINE DRUG SCREEN, HOSP PERFORMED
Amphetamines: NOT DETECTED
Barbiturates: NOT DETECTED
Benzodiazepines: NOT DETECTED
Cocaine: POSITIVE — AB
Opiates: NOT DETECTED
Tetrahydrocannabinol: NOT DETECTED

## 2018-08-22 LAB — COMPREHENSIVE METABOLIC PANEL
ALK PHOS: 87 U/L (ref 38–126)
ALT: 14 U/L (ref 0–44)
ANION GAP: 7 (ref 5–15)
AST: 28 U/L (ref 15–41)
Albumin: 2.9 g/dL — ABNORMAL LOW (ref 3.5–5.0)
BUN: 20 mg/dL (ref 6–20)
CO2: 24 mmol/L (ref 22–32)
Calcium: 8.8 mg/dL — ABNORMAL LOW (ref 8.9–10.3)
Chloride: 107 mmol/L (ref 98–111)
Creatinine, Ser: 1 mg/dL (ref 0.61–1.24)
GFR calc Af Amer: >60 mL/min (ref >60)
GFR calc non Af Amer: >60 mL/min (ref >60)
GLUCOSE: 154 mg/dL — AB (ref 70–99)
Potassium: 4 mmol/L (ref 3.5–5.1)
Sodium: 138 mmol/L (ref 135–145)
Total Bilirubin: 1.1 mg/dL (ref 0.3–1.2)
Total Protein: 6.6 g/dL (ref 6.5–8.1)

## 2018-08-22 LAB — I-STAT TROPONIN, ED
Troponin i, poc: 0.04 ng/mL (ref 0.00–0.08)
Troponin i, poc: 0.04 ng/mL (ref 0.00–0.08)

## 2018-08-22 LAB — SALICYLATE LEVEL: Salicylate Lvl: <7 mg/dL (ref 2.8–30.0)

## 2018-08-22 LAB — ETHANOL: Alcohol, Ethyl (B): <10 mg/dL (ref <10)

## 2018-08-22 MED ORDER — LISINOPRIL 10 MG PO TABS: 5.0000 mg | ORAL_TABLET | Freq: Every day | ORAL | Status: DC

## 2018-08-22 MED ORDER — FUROSEMIDE 20 MG PO TABS
40.0000 mg | ORAL_TABLET | Freq: Once | ORAL | Status: AC
  Administered 2018-08-22: 40 mg via ORAL
  Filled 2018-08-22: qty 2

## 2018-08-22 MED ORDER — ASPIRIN EC 81 MG PO TBEC: 81.0000 mg | DELAYED_RELEASE_TABLET | Freq: Every day | ORAL | Status: DC

## 2018-08-22 MED ORDER — FUROSEMIDE 20 MG PO TABS: 20.0000 mg | ORAL_TABLET | Freq: Every day | ORAL | Status: DC

## 2018-08-22 MED ORDER — ATORVASTATIN CALCIUM 40 MG PO TABS: 40.0000 mg | ORAL_TABLET | Freq: Every day | ORAL | Status: DC

## 2018-08-22 MED ORDER — INSULIN GLARGINE 100 UNIT/ML ~~LOC~~ SOLN
20.0000 [IU] | Freq: Every day | SUBCUTANEOUS | Status: DC
  Filled 2018-08-22 (×2): qty 0.2

## 2018-08-22 MED ORDER — FUROSEMIDE 10 MG/ML IJ SOLN: 40.0000 mg | Freq: Once | INTRAMUSCULAR | Status: DC

## 2018-08-22 MED ORDER — AMLODIPINE BESYLATE 5 MG PO TABS: 10.0000 mg | ORAL_TABLET | Freq: Every day | ORAL | Status: DC

## 2018-08-22 MED ORDER — ALPRAZOLAM 0.25 MG PO TABS
0.5000 mg | ORAL_TABLET | Freq: Once | ORAL | Status: AC
  Administered 2018-08-22: 0.5 mg via ORAL
  Filled 2018-08-22: qty 2

## 2018-08-22 NOTE — ED Provider Notes (Signed)
[facility] EMERGENCY DEPARTMENT Provider Note   CSN: [id number] Arrival date & time: 08/22/18  1801    History   Chief Complaint No chief complaint on file.   HPI [name] is a 58 y.o. male hx of CHF with EF 35%, diabetes, schizophrenia, known cocaine use, here presenting with shortness of breath, suicidal ideation.  Patient does admit to a cocaine binge recently.  Patient is complaining of some chest pains and some shortness of breath as well as some leg swelling.  Patient states that he has not been taking his medicines as prescribed.  He was in the hospital multiple times and most recently signed out AGAINST MEDICAL ADVICE 2 days ago.  Patient told me this time that he does have a place to stay and he is not homeless anymore.  Patient does admit to some vague suicidal ideations.  He denies any particular plan to kill himself.     The history is provided by the patient.    Past Medical History:  Diagnosis Date  . CHF (congestive heart failure) (HCC)   . Chronic foot pain   . Cocaine abuse (HCC)   . Diabetes mellitus without complication (HCC)   . Hepatitis C    unsure   . Homelessness   . Hypertension   . Neuropathy   . Polysubstance abuse (HCC)   . Schizophrenia (HCC)   . Sleep apnea     Patient Active Problem List   Diagnosis Date Noted  . Acute on chronic combined systolic and diastolic congestive heart failure (HCC) 08/18/2018  . Tobacco use 08/18/2018  . 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 Homelessness 08/08/2018  . Smoker 08/08/2018  . CHF (congestive heart failure) (HCC) 07/15/2018  . Acute exacerbation of CHF (congestive heart failure) (HCC) 03/28/2018  . Prostate enlargement 03/16/2018  . Aortic atherosclerosis (HCC) 03/16/2018  . 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 Chronic foot pain   . Schizoaffective disorder, bipolar type (HCC) 09/30/2016    . Substance induced mood disorder (HCC) 03/13/2015  . Acute kidney failure (HCC) 01/26/2015  . Schizophrenia, paranoid type (HCC) 01/17/2015  . Suicidal ideation   . Drug hallucinosis (HCC) 10/08/2014  . Chronic paranoid schizophrenia (HCC) 09/07/2014  . 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 Urinary retention   . Cocaine use disorder, severe, dependence (HCC)   . Essential hypertension, benign 03/28/2013  . Type 2 diabetes mellitus (HCC) 03/15/2013    Past Surgical History:  Procedure Laterality Date  . MULTIPLE TOOTH EXTRACTIONS          Home Medications    Prior to Admission medications   Medication Sig Start Date End Date Taking? Authorizing Provider  amLODipine (NORVASC) 10 MG tablet Take 1 tablet (10 mg total) by mouth daily. Patient not taking: Reported on 08/03/2018 08/03/18   Gilda Crease, MD  aspirin EC 81 MG tablet Take 1 tablet (81 mg total) by mouth daily. Patient not taking: Reported on 08/18/2018 08/09/18 08/09/19  Berton Mount I, MD  atorvastatin (LIPITOR) 40 MG tablet Take 1 tablet (40 mg total) by mouth daily. Patient not taking: Reported on 08/03/2018 08/03/18   Gilda Crease, MD  furosemide (LASIX) 20 MG tablet Take 40 mg oral daily, and 20 mg every evening Patient not taking: Reported on 08/03/2018 08/03/18   Gilda Crease, MD  Insulin Glargine (LANTUS) 100 UNIT/ML Solostar  Pen Inject 20 Units into the skin daily. 08/03/18   Gilda Crease, MD  Insulin Pen Needle 29G X MISC Use with insulin 08/03/18   Pollina, Canary Brim, MD  lisinopril (PRINIVIL,ZESTRIL) 5 MG tablet Take 1 tablet (5 mg total) by mouth daily. Patient not taking: Reported on 08/03/2018 08/03/18   Gilda Crease, MD    Family History Family History  Problem Relation Age of Onset  . Hypertension Other   . Diabetes Other     Social History Social History   Tobacco Use  . Smoking  status: Current Every Day Smoker    Packs/day: 1.00    Years: 20.00    Pack years: 20.00    Types: Cigarettes  . Smokeless tobacco: Current User  Substance Use Topics  . Alcohol use: Yes    Comment: Daily Drinker   . Drug use: Yes    Frequency: 7.0 times per week    Types: "Crack" cocaine, Cocaine, Marijuana    Comment: Cocaine tonight, Marijuana "a long time"     Allergies   Haldol [haloperidol]   Review of Systems Review of Systems  Respiratory: Positive for shortness of breath.   Cardiovascular: Positive for chest pain and leg swelling.  All other systems reviewed and are negative.    Physical Exam Updated Vital Signs BP (!) 140/96   Pulse (!) 102   Temp 98.5 F (36.9 C) (Oral)   Resp 18   Ht 5\' 9"  (1.753 m)   Wt 83.4 kg   SpO2 95%   BMI 27.14 kg/m   Physical Exam Vitals signs and nursing note reviewed.  Constitutional:      Comments: Chronically ill, appears high on cocaine   HENT:     Head: Normocephalic.     Nose: Nose normal.     Mouth/Throat:     Mouth: Mucous membranes are moist.  Eyes:     Pupils: Pupils are equal, round, and reactive to light.  Neck:     Musculoskeletal: Normal range of motion.  Cardiovascular:     Rate and Rhythm: Normal rate and regular rhythm.  Pulmonary:     Effort: Pulmonary effort is normal.     Breath sounds: Normal breath sounds.  Abdominal:     General: Abdomen is flat.     Palpations: Abdomen is soft.  Musculoskeletal: Normal range of motion.     Comments: 1+ edema bilaterally   Skin:    General: Skin is warm.     Capillary Refill: Capillary refill takes less than 2 seconds.  Neurological:     General: No focal deficit present.     Mental Status: He is alert.  Psychiatric:        Mood and Affect: Mood normal.        Behavior: Behavior normal.      ED Treatments / Results  Labs (all labs ordered are listed, but only abnormal results are displayed) Labs Reviewed  CBC WITH DIFFERENTIAL/PLATELET -  Abnormal; Notable for the following components:      Result Value   Hemoglobin 11.2 (*)    HCT 37.4 (*)    MCH 25.8 (*)    MCHC 29.9 (*)    RDW 17.0 (*)    All other components within normal limits  COMPREHENSIVE METABOLIC PANEL  BRAIN NATRIURETIC PEPTIDE  ETHANOL  SALICYLATE LEVEL  ACETAMINOPHEN LEVEL  RAPID URINE DRUG SCREEN, HOSP PERFORMED  I-STAT TROPONIN, ED    EKG None  Radiology Dg Chest [facility]  1 View  Result Date: 08/22/2018 CLINICAL DATA:  Chest pain. EXAM: PORTABLE CHEST 1 VIEW COMPARISON:  08/17/2018 FINDINGS: 1819 hours. The cardio pericardial silhouette is enlarged. There is pulmonary vascular congestion without overt pulmonary edema. Probable interstitial pulmonary edema. Left base atelectasis or scarring is similar to prior. Otherwise aeration at the lung bases has improved since previous study. The visualized bony structures of the thorax are intact. Telemetry leads overlie the chest. IMPRESSION: 1. Cardiomegaly with vascular congestion and probable interstitial edema. 2. Interval improvement in bibasilar aeration with some residual atelectasis or scarring in the left lower lung. Electronically Signed   By: Kennith Center M.D.   On: 08/22/2018 18:40    Procedures Procedures (including critical care time)  Medications Ordered in ED Medications  furosemide (LASIX) injection 40 mg (has no administration in time range)  ALPRAZolam (XANAX) tablet 0.5 mg (0.5 mg Oral Given 08/22/18 1900)     Initial Impression / Assessment and Plan / ED Course  I have reviewed the triage vital signs and the nursing notes.  Pertinent labs & imaging results that were available during my care of the patient were reviewed by me and considered in my medical decision making (see chart for details).       [name] is a 58 y.o. male here with SOB, leg swelling, suicidal ideation. Vague suicidal ideation with no obvious plan. Patient also has worse SOB in the setting of cocaine  binge and not taking his meds. No obvious STEMI on EKG. Plan to get trop x 2, BNP, CXR, ETOH, UDS.    11:28 PM UDS + cocaine. BNP 700, improved from previous. Delta trop negative. CXR showed some vascular congestion but he doesn't require any oxygen. Medically cleared for psych eval. Given lasix in the ED, restarted on home meds.   Final Clinical Impressions(s) / ED Diagnoses   Final diagnoses:  None    ED Discharge Orders    None       Charlynne Pander, MD 08/22/18 2329

## 2018-08-22 NOTE — ED Notes (Signed)
Spoke with [facility] who states TTS will not be done for approximately another hour

## 2018-08-22 NOTE — ED Triage Notes (Signed)
[place] [facility] [place] with complaints of "chest pain but no chest pain, just heart failure". Pt hypertensive en route, 150/102 en route, CBG 242. Pt cursing and verbally aggressive with staff upon arrival.

## 2018-08-23 LAB — CBG MONITORING, ED: Glucose-Capillary: 223 mg/dL — ABNORMAL HIGH (ref 70–99)

## 2018-08-23 MED ORDER — ACETAMINOPHEN 500 MG PO TABS
1000.0000 mg | ORAL_TABLET | Freq: Once | ORAL | Status: AC
  Administered 2018-08-23: 1000 mg via ORAL
  Filled 2018-08-23: qty 2

## 2018-08-23 NOTE — ED Notes (Signed)
Pt states he understands instructions and agrees with follow up . Pt ambulates to discharge.

## 2018-08-23 NOTE — ED Notes (Signed)
Pt sttes he does not want to wait to see telepsych. States he wants to leave. Will inform Dr. Patria Mane.

## 2018-08-23 NOTE — ED Provider Notes (Signed)
Patient is well-appearing.  He has no complaints.  He ambulates without difficulty.  He is well-known to myself in this department.  He is stable for discharge   Azalia Bilis, MD 08/23/18 [phone]

## 2018-08-23 NOTE — BH Assessment (Addendum)
Tele Assessment Note   Patient Name: [name] MRN: [id number] Referring Physician: Chaney Malling, MD Location of Patient: [facility], [phone] Location of Provider: Behavioral Health TTS Department  [name] is an 58 y.o. male who presents unaccompanied to [facility] ED via EMS reporting chest pain and suicidal ideation. Pt has a history of schizophrenia and substance abuse. Per ED notes, Pt was verbally aggressive staff upon arrival. Pt is a poor historian due to appearing very drowsy and also presenting with rambling speech. Pt says that he has been using crack and marijuana and he wants to be in a psychiatric facility "like [facility] or or Behavioral Health." He says he has suicidal thoughts and "I'm a danger to myself." Pt cannot articulate a suicide plan but says "If I'm running around acting crazy someone will take me out." He denies homicidal ideation and says he doesn't want to harm anyone other than himself. He says he has been off psychiatric medications since he was released from prison one year ago. He denies current auditory or visual hallucinations. He says he feels "confused." Pt reports he is using cocaine and marijuana but is unable to give specifics of use.  Pt says he is chronically homeless but does have a place to stay at the moment. He says his cousin is Pt's payee and is helping Pt pay bills. Pt was recently in jail for 48 hours and then admitted to a medical floor. Pt left medical floor AMA on 08/20/18. Pt says he has pending charges for trespassing and possible other charges. Pt says he does not have a current psychiatrist or therapist. Pt has been psychiatrically hospitalized several times at different facilities.  Pt is dressed in hospital scrubs. He is very drowsy and repeatedly fell asleep during assessment. Pt speaks in a mumbled tone, at moderate volume and rapid pace. Motor behavior appears restless when Pt is not sleeping. Eye contact is fair. Pt's mood is  anxious and affect is congruent with mood. Thought process is coherent but circumstantial. Pt is requesting inpatient psychiatric treatment.   Diagnosis:  F20.9 Schizophrenia F14.20 Cocaine use disorder, Severe  Past Medical History:  Past Medical History:  Diagnosis Date  . CHF (congestive heart failure) (HCC)   . Chronic foot pain   . Cocaine abuse (HCC)   . Diabetes mellitus without complication (HCC)   . Hepatitis C    unsure   . Homelessness   . Hypertension   . Neuropathy   . Polysubstance abuse (HCC)   . Schizophrenia (HCC)   . Sleep apnea     Past Surgical History:  Procedure Laterality Date  . MULTIPLE TOOTH EXTRACTIONS      Family History:  Family History  Problem Relation Age of Onset  . Hypertension Other   . Diabetes Other     Social History:  reports that he has been smoking cigarettes. He has a 20.00 pack-year smoking history. He uses smokeless tobacco. He reports current alcohol use. He reports current drug use. Frequency: 7.00 times per week. Drugs: "Crack" cocaine, Cocaine, and Marijuana.  Additional Social History:  Alcohol / Drug Use Pain Medications: Please see MAR Prescriptions: Please see MAR Over the Counter: Please see mAR History of alcohol / drug use?: Yes Longest period of sobriety (when/how long): Unknown Negative Consequences of Use: Financial, Legal, Personal relationships, Work / School Substance #1 Name of Substance 1: Marijuana 1 - Age of First Use: Unknown 1 - Amount (size/oz): Unknown 1 - Frequency:  Weekly 1 - Duration: Unknown 1 - Last Use / Amount: 08/22/18 Substance #2 Name of Substance 2: Cocaine 2 - Age of First Use: Unknown 2 - Amount (size/oz): 1 gram a day 2 - Frequency: Daily 2 - Duration: Unknown 2 - Last Use / Amount: 08/22/18  CIWA: CIWA-Ar BP: (!) 145/91 Pulse Rate: 95 COWS:    Allergies:  Allergies  Allergen Reactions  . Haldol [Haloperidol] Other (See Comments)    Muscle spasms, loss of voluntary  movement. However, pt has taken Thorazine on multiple occasions with no adverse effects.     Home Medications: (Not in a hospital admission)   OB/GYN Status:  No LMP for male patient.  General Assessment Data Location of Assessment: [facility] ED TTS Assessment: In system Is this a Tele or Face-to-Face Assessment?: Tele Assessment Is this an Initial Assessment or a Re-assessment for this encounter?: Initial Assessment Patient Accompanied by:: N/A Language Other than English: No Living Arrangements: Homeless/Shelter What gender do you identify as?: Male Marital status: Single Maiden name: NA Pregnancy Status: No Living Arrangements: Alone Can pt return to current living arrangement?: Yes Admission Status: Voluntary Is patient capable of signing voluntary admission?: Yes Referral Source: Self/Family/Friend Insurance type: Medicaid     Crisis Care Plan Living Arrangements: Alone Legal Guardian: Other:(Self) Name of Psychiatrist: None Name of Therapist: None  Education Status Is patient currently in school?: No Is the patient employed, unemployed or receiving disability?: Unemployed  Risk to self with the past 6 months Suicidal Ideation: Yes-Currently Present Has patient been a risk to self within the past 6 months prior to admission? : Yes Suicidal Intent: No Has patient had any suicidal intent within the past 6 months prior to admission? : Yes Is patient at risk for suicide?: Yes Suicidal Plan?: No Has patient had any suicidal plan within the past 6 months prior to admission? : No Specify Current Suicidal Plan: Pt has no specific plan Access to Means: No What has been your use of drugs/alcohol within the last 12 months?: Pt reports using cocaine and marijuana Previous Attempts/Gestures: Yes How many times?: (Unknown) Other Self Harm Risks: Pt not caring for medical problems Triggers for Past Attempts: Unpredictable Intentional Self Injurious Behavior: None Family Suicide  History: Unable to assess Recent stressful life event(s): Financial Problems Persecutory voices/beliefs?: No Depression: Yes Depression Symptoms: Despondent, Insomnia, Tearfulness, Isolating, Guilt, Fatigue, Loss of interest in usual pleasures, Feeling worthless/self pity, Feeling angry/irritable Substance abuse history and/or treatment for substance abuse?: Yes Suicide prevention information given to non-admitted patients: Not applicable  Risk to Others within the past 6 months Homicidal Ideation: No Does patient have any lifetime risk of violence toward others beyond the six months prior to admission? : No Thoughts of Harm to Others: No Comment - Thoughts of Harm to Others: Pt denies Current Homicidal Intent: No Current Homicidal Plan: No Access to Homicidal Means: No Identified Victim: None History of harm to others?: No Assessment of Violence: In past 6-12 months Violent Behavior Description: Pt has been verbally aggressive Does patient have access to weapons?: No Criminal Charges Pending?: Yes Describe Pending Criminal Charges: Trespassing Does patient have a court date: Yes Court Date: (Unknown) Is patient on probation?: Unknown  Psychosis Hallucinations: None noted Delusions: None noted  Mental Status Report Appearance/Hygiene: In hospital gown Eye Contact: Fair Motor Activity: Restlessness Speech: Rapid, Slurred Level of Consciousness: Drowsy Mood: Anxious Affect: Anxious Anxiety Level: Severe Thought Processes: Circumstantial Judgement: Impaired Orientation: Person, Place, Time, Situation Obsessive Compulsive Thoughts/Behaviors: None  Cognitive Functioning Concentration: Poor Memory: Recent Intact, Remote Intact Is patient IDD: No Insight: Poor Impulse Control: Poor Appetite: Fair Have you had any weight changes? : No Change Sleep: Decreased Total Hours of Sleep: 3 Vegetative Symptoms: None  ADLScreening [facility] Assessment Services) Patient's cognitive  ability adequate to safely complete daily activities?: Yes Patient able to express need for assistance with ADLs?: Yes Independently performs ADLs?: Yes (appropriate for developmental age)  Prior Inpatient Therapy Prior Inpatient Therapy: Yes Prior Therapy Dates: Multiple admits Prior Therapy Facilty/Provider(s): Multiple facilities Reason for Treatment: Schizophrenia, substance abuse  Prior Outpatient Therapy Prior Outpatient Therapy: Yes Prior Therapy Dates: unknown Prior Therapy Facilty/Provider(s): [name] Reason for Treatment: Schizophrenia, substance abuse Does patient have an ACCT team?: No Does patient have Intensive In-House Services?  : No Does patient have [name] services? : No Does patient have P4CC services?: No  ADL Screening (condition at time of admission) Patient's cognitive ability adequate to safely complete daily activities?: Yes Is the patient deaf or have difficulty hearing?: No Does the patient have difficulty seeing, even when wearing glasses/contacts?: No Does the patient have difficulty concentrating, remembering, or making decisions?: Yes Patient able to express need for assistance with ADLs?: Yes Does the patient have difficulty dressing or bathing?: No Independently performs ADLs?: Yes (appropriate for developmental age) Does the patient have difficulty walking or climbing stairs?: No Weakness of Legs: None Weakness of Arms/Hands: None       Abuse/Neglect Assessment (Assessment to be complete while patient is alone) Abuse/Neglect Assessment Can Be Completed: Yes Physical Abuse: Denies Verbal Abuse: Denies Sexual Abuse: Denies Exploitation of patient/patient's resources: Denies Self-Neglect: Denies     Merchant navy officer (For Healthcare) Does Patient Have a Medical Advance Directive?: No Would patient like information on creating a medical advance directive?: No - Patient declined          Disposition: Gave clinical report to Nira Conn, FNP who recommended Pt be observed and evaluated by psychiatry in the morning due to Pt's numerous presentations with similar symptoms. Notified Dr. Elesa Massed and Judeth Cornfield, RN of recommendation.  Disposition Initial Assessment Completed for this Encounter: Yes Patient referred to: Other (Comment)  This service was provided via telemedicine using a 2-way, interactive audio and video technology.  Names of all persons participating in this telemedicine service and their role in this encounter. Name: [name] Role: Patient  Name: Shela Commons, [facility] Role: TTS counselor         Harlin Rain Patsy [place], [facility], [facility], [facility] Triage Specialist [phone]  Pamalee Leyden 08/23/2018 12:43 AM

## 2018-08-23 NOTE — ED Notes (Signed)
Pt refused vitals, insists to have a shower first, set up for shower

## 2018-08-23 NOTE — ED Notes (Signed)
Gave pt milk and one pack of graham crackers and cheese.

## 2018-08-23 NOTE — ED Notes (Signed)
Pt oob and ambulates to bathroom with steady gait. Pt denies complaint. Breakfast tray ordered. Pt requesting shower.

## 2018-08-23 NOTE — ED Notes (Signed)
Pt eating breakfast and tolerating well. Dr. Patria Mane aware of CBG and no coverage and states ok to DC/

## 2018-08-23 NOTE — ED Notes (Signed)
Pt given turkey sandwich

## 2018-08-24 ENCOUNTER — Observation Stay (HOSPITAL_COMMUNITY)
Admission: EM | Admit: 2018-08-24 | Discharge: 2018-08-26 | Disposition: A | Discharge status: Home / Self Care | Payer: Medicaid Other | Attending: Internal Medicine | Admitting: Internal Medicine

## 2018-08-24 ENCOUNTER — Emergency Department (HOSPITAL_COMMUNITY): Payer: Medicaid Other

## 2018-08-24 ENCOUNTER — Other Ambulatory Visit: Payer: Self-pay

## 2018-08-24 DIAGNOSIS — F1420 Cocaine dependence, uncomplicated: Secondary | ICD-10-CM | POA: Insufficient documentation

## 2018-08-24 DIAGNOSIS — I5043 Acute on chronic combined systolic (congestive) and diastolic (congestive) heart failure: Secondary | ICD-10-CM | POA: Insufficient documentation

## 2018-08-24 DIAGNOSIS — Z5900 Homelessness unspecified: Secondary | ICD-10-CM

## 2018-08-24 DIAGNOSIS — R0602 Shortness of breath: Secondary | ICD-10-CM | POA: Diagnosis present

## 2018-08-24 DIAGNOSIS — I110 Hypertensive heart disease with heart failure: Secondary | ICD-10-CM | POA: Diagnosis not present

## 2018-08-24 DIAGNOSIS — I10 Essential (primary) hypertension: Secondary | ICD-10-CM | POA: Diagnosis present

## 2018-08-24 DIAGNOSIS — J9601 Acute respiratory failure with hypoxia: Secondary | ICD-10-CM | POA: Diagnosis not present

## 2018-08-24 DIAGNOSIS — I509 Heart failure, unspecified: Secondary | ICD-10-CM

## 2018-08-24 DIAGNOSIS — Z888 Allergy status to other drugs, medicaments and biological substances status: Secondary | ICD-10-CM | POA: Insufficient documentation

## 2018-08-24 DIAGNOSIS — Z794 Long term (current) use of insulin: Secondary | ICD-10-CM | POA: Diagnosis not present

## 2018-08-24 DIAGNOSIS — M7989 Other specified soft tissue disorders: Secondary | ICD-10-CM | POA: Insufficient documentation

## 2018-08-24 DIAGNOSIS — E119 Type 2 diabetes mellitus without complications: Secondary | ICD-10-CM | POA: Insufficient documentation

## 2018-08-24 DIAGNOSIS — Z9119 Patient's noncompliance with other medical treatment and regimen: Secondary | ICD-10-CM | POA: Insufficient documentation

## 2018-08-24 DIAGNOSIS — F200 Paranoid schizophrenia: Secondary | ICD-10-CM | POA: Insufficient documentation

## 2018-08-24 DIAGNOSIS — Z7982 Long term (current) use of aspirin: Secondary | ICD-10-CM | POA: Diagnosis not present

## 2018-08-24 DIAGNOSIS — Z9114 Patient's other noncompliance with medication regimen: Secondary | ICD-10-CM | POA: Diagnosis not present

## 2018-08-24 DIAGNOSIS — N400 Enlarged prostate without lower urinary tract symptoms: Secondary | ICD-10-CM | POA: Diagnosis present

## 2018-08-24 DIAGNOSIS — Z79899 Other long term (current) drug therapy: Secondary | ICD-10-CM | POA: Insufficient documentation

## 2018-08-24 LAB — CBC
HCT: 35.6 % — ABNORMAL LOW (ref 39.0–52.0)
Hemoglobin: 10.3 g/dL — ABNORMAL LOW (ref 13.0–17.0)
MCH: 25.4 pg — ABNORMAL LOW (ref 26.0–34.0)
MCHC: 28.9 g/dL — ABNORMAL LOW (ref 30.0–36.0)
MCV: 87.7 fL (ref 80.0–100.0)
PLATELETS: 253 10*3/uL (ref 150–400)
RBC: 4.06 MIL/uL — AB (ref 4.22–5.81)
RDW: 17 % — ABNORMAL HIGH (ref 11.5–15.5)
WBC: 7.4 10*3/uL (ref 4.0–10.5)
nRBC: 0 % (ref 0.0–0.2)

## 2018-08-24 LAB — BASIC METABOLIC PANEL
Anion gap: 7 (ref 5–15)
BUN: 16 mg/dL (ref 6–20)
CALCIUM: 8.6 mg/dL — AB (ref 8.9–10.3)
CO2: 26 mmol/L (ref 22–32)
Chloride: 106 mmol/L (ref 98–111)
Creatinine, Ser: 1.05 mg/dL (ref 0.61–1.24)
GFR calc Af Amer: >60 mL/min (ref >60)
GFR calc non Af Amer: >60 mL/min (ref >60)
Glucose, Bld: 275 mg/dL — ABNORMAL HIGH (ref 70–99)
Potassium: 3.4 mmol/L — ABNORMAL LOW (ref 3.5–5.1)
Sodium: 139 mmol/L (ref 135–145)

## 2018-08-24 LAB — I-STAT TROPONIN, ED: Troponin i, poc: 0.04 ng/mL (ref 0.00–0.08)

## 2018-08-24 MED ORDER — SODIUM CHLORIDE 0.9% FLUSH
3.0000 mL | Freq: Once | INTRAVENOUS | Status: AC
  Administered 2018-08-25: 3 mL via INTRAVENOUS

## 2018-08-24 NOTE — ED Triage Notes (Signed)
Reports CP, SOB onset PTA. States was just released from jail within past hr. Ambulatory.

## 2018-08-25 ENCOUNTER — Other Ambulatory Visit: Payer: Self-pay

## 2018-08-25 ENCOUNTER — Encounter (HOSPITAL_COMMUNITY): Payer: Self-pay | Admitting: Student

## 2018-08-25 DIAGNOSIS — F200 Paranoid schizophrenia: Secondary | ICD-10-CM

## 2018-08-25 DIAGNOSIS — I5043 Acute on chronic combined systolic (congestive) and diastolic (congestive) heart failure: Secondary | ICD-10-CM | POA: Insufficient documentation

## 2018-08-25 DIAGNOSIS — I10 Essential (primary) hypertension: Secondary | ICD-10-CM | POA: Diagnosis not present

## 2018-08-25 DIAGNOSIS — J9601 Acute respiratory failure with hypoxia: Secondary | ICD-10-CM | POA: Diagnosis present

## 2018-08-25 DIAGNOSIS — F1420 Cocaine dependence, uncomplicated: Secondary | ICD-10-CM

## 2018-08-25 DIAGNOSIS — Z794 Long term (current) use of insulin: Secondary | ICD-10-CM

## 2018-08-25 DIAGNOSIS — E119 Type 2 diabetes mellitus without complications: Secondary | ICD-10-CM

## 2018-08-25 LAB — I-STAT TROPONIN, ED: Troponin i, poc: 0.03 ng/mL (ref 0.00–0.08)

## 2018-08-25 LAB — RAPID URINE DRUG SCREEN, HOSP PERFORMED
Amphetamines: NOT DETECTED
BENZODIAZEPINES: NOT DETECTED
Barbiturates: NOT DETECTED
Cocaine: POSITIVE — AB
Opiates: NOT DETECTED
Tetrahydrocannabinol: NOT DETECTED

## 2018-08-25 LAB — BRAIN NATRIURETIC PEPTIDE: B Natriuretic Peptide: 1103 pg/mL — ABNORMAL HIGH (ref 0.0–100.0)

## 2018-08-25 LAB — GLUCOSE, CAPILLARY
Glucose-Capillary: 200 mg/dL — ABNORMAL HIGH (ref 70–99)
Glucose-Capillary: 134 mg/dL — ABNORMAL HIGH (ref 70–99)
Glucose-Capillary: 273 mg/dL — ABNORMAL HIGH (ref 70–99)

## 2018-08-25 MED ORDER — LISINOPRIL 5 MG PO TABS
5.0000 mg | ORAL_TABLET | Freq: Every day | ORAL | Status: DC
  Administered 2018-08-25 – 2018-08-26 (×2): 5 mg via ORAL
  Filled 2018-08-25 (×2): qty 1

## 2018-08-25 MED ORDER — ENOXAPARIN SODIUM 40 MG/0.4ML ~~LOC~~ SOLN
40.0000 mg | SUBCUTANEOUS | Status: DC
  Administered 2018-08-25: 40 mg via SUBCUTANEOUS
  Filled 2018-08-25 (×2): qty 0.4

## 2018-08-25 MED ORDER — INSULIN GLARGINE 100 UNIT/ML ~~LOC~~ SOLN
10.0000 [IU] | Freq: Every day | SUBCUTANEOUS | Status: DC
  Administered 2018-08-25: 10 [IU] via SUBCUTANEOUS
  Filled 2018-08-25: qty 0.1

## 2018-08-25 MED ORDER — ACETAMINOPHEN 325 MG PO TABS
650.0000 mg | ORAL_TABLET | ORAL | Status: DC | PRN
  Administered 2018-08-25 – 2018-08-26 (×3): 650 mg via ORAL
  Filled 2018-08-25 (×3): qty 2

## 2018-08-25 MED ORDER — FUROSEMIDE 10 MG/ML IJ SOLN
40.0000 mg | Freq: Two times a day (BID) | INTRAMUSCULAR | Status: DC
  Administered 2018-08-25 – 2018-08-26 (×3): 40 mg via INTRAVENOUS
  Filled 2018-08-25 (×3): qty 4

## 2018-08-25 MED ORDER — NICOTINE 21 MG/24HR TD PT24
21.0000 mg | MEDICATED_PATCH | Freq: Every day | TRANSDERMAL | Status: DC
  Administered 2018-08-25: 21 mg via TRANSDERMAL
  Filled 2018-08-25 (×2): qty 1

## 2018-08-25 MED ORDER — INSULIN ASPART 100 UNIT/ML ~~LOC~~ SOLN
0.0000 [IU] | Freq: Three times a day (TID) | SUBCUTANEOUS | Status: DC
  Administered 2018-08-25: 2 [IU] via SUBCUTANEOUS
  Administered 2018-08-25: 1 [IU] via SUBCUTANEOUS

## 2018-08-25 MED ORDER — SODIUM CHLORIDE 0.9% FLUSH
3.0000 mL | Freq: Two times a day (BID) | INTRAVENOUS | Status: DC
  Administered 2018-08-25 (×2): 3 mL via INTRAVENOUS

## 2018-08-25 MED ORDER — ASPIRIN EC 81 MG PO TBEC
81.0000 mg | DELAYED_RELEASE_TABLET | Freq: Every day | ORAL | Status: DC
  Administered 2018-08-25 – 2018-08-26 (×2): 81 mg via ORAL
  Filled 2018-08-25 (×2): qty 1

## 2018-08-25 MED ORDER — POTASSIUM CHLORIDE CRYS ER 20 MEQ PO TBCR
40.0000 meq | EXTENDED_RELEASE_TABLET | Freq: Once | ORAL | Status: AC
  Administered 2018-08-25: 40 meq via ORAL
  Filled 2018-08-25: qty 2

## 2018-08-25 MED ORDER — ONDANSETRON HCL 4 MG/2ML IJ SOLN: 4.0000 mg | Freq: Four times a day (QID) | INTRAMUSCULAR | Status: DC | PRN

## 2018-08-25 MED ORDER — ZOLPIDEM TARTRATE 5 MG PO TABS
5.0000 mg | ORAL_TABLET | Freq: Once | ORAL | Status: AC
  Administered 2018-08-25: 5 mg via ORAL
  Filled 2018-08-25: qty 1

## 2018-08-25 MED ORDER — ATORVASTATIN CALCIUM 40 MG PO TABS
40.0000 mg | ORAL_TABLET | Freq: Every day | ORAL | Status: DC
  Administered 2018-08-25: 40 mg via ORAL
  Filled 2018-08-25: qty 1

## 2018-08-25 MED ORDER — SODIUM CHLORIDE 0.9 % IV SOLN: 250.0000 mL | INTRAVENOUS | Status: DC | PRN

## 2018-08-25 MED ORDER — FUROSEMIDE 10 MG/ML IJ SOLN
40.0000 mg | Freq: Once | INTRAMUSCULAR | Status: AC
  Administered 2018-08-25: 40 mg via INTRAVENOUS
  Filled 2018-08-25: qty 4

## 2018-08-25 MED ORDER — SODIUM CHLORIDE 0.9% FLUSH: 3.0000 mL | INTRAVENOUS | Status: DC | PRN

## 2018-08-25 MED ORDER — BISACODYL 5 MG PO TBEC
5.0000 mg | DELAYED_RELEASE_TABLET | Freq: Every day | ORAL | Status: DC | PRN
  Administered 2018-08-25: 5 mg via ORAL
  Filled 2018-08-25: qty 1

## 2018-08-25 MED ORDER — AMLODIPINE BESYLATE 10 MG PO TABS
10.0000 mg | ORAL_TABLET | Freq: Every day | ORAL | Status: DC
  Administered 2018-08-25 – 2018-08-26 (×2): 10 mg via ORAL
  Filled 2018-08-25: qty 2
  Filled 2018-08-25: qty 1

## 2018-08-25 MED ORDER — INSULIN ASPART 100 UNIT/ML ~~LOC~~ SOLN
0.0000 [IU] | Freq: Every day | SUBCUTANEOUS | Status: DC
  Administered 2018-08-25: 3 [IU] via SUBCUTANEOUS

## 2018-08-25 NOTE — ED Provider Notes (Signed)
[facility] EMERGENCY DEPARTMENT Provider Note   CSN: [id number] Arrival date & time: 08/24/18  2146    History   Chief Complaint Chief Complaint  Patient presents with  . Shortness of Breath    HPI [name] is a 58 y.o. male with a hx of CHF last EF 30-35%, polysubstance abuse, DM, hepatitis C, HTN, homelessness, schizophrenia, and sleep apnea who presents to the ED with complaints of dyspnea that started shortly PTA.  Patient states that he was leaving jail and as he was ambulating he began to feel short of breath.  He states his shortness of breath is associated with chest discomfort & cough with phlegm sputum production.  He states the symptoms have been constant since onset.  No alleviating or aggravating factors.  No intervention prior to arrival.  He states that his legs are swollen..  He has not been taking his medications recently.  Denies fever, chills, lightheadedness, dizziness, syncope, or leg erythema.     HPI  Past Medical History:  Diagnosis Date  . CHF (congestive heart failure) (HCC)   . Chronic foot pain   . Cocaine abuse (HCC)   . Diabetes mellitus without complication (HCC)   . Hepatitis C    unsure   . Homelessness   . Hypertension   . Neuropathy   . Polysubstance abuse (HCC)   . Schizophrenia (HCC)   . Sleep apnea     Patient Active Problem List   Diagnosis Date Noted  . Acute on chronic combined systolic and diastolic congestive heart failure (HCC) 08/18/2018  . Tobacco use 08/18/2018  . 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 Homelessness 08/08/2018  . Smoker 08/08/2018  . CHF (congestive heart failure) (HCC) 07/15/2018  . Acute exacerbation of CHF (congestive heart failure) (HCC) 03/28/2018  . Prostate enlargement 03/16/2018  . Aortic atherosclerosis (HCC) 03/16/2018  . 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 Chronic foot pain   .  Schizoaffective disorder, bipolar type (HCC) 09/30/2016  . Substance induced mood disorder (HCC) 03/13/2015  . Acute kidney failure (HCC) 01/26/2015  . Schizophrenia, paranoid type (HCC) 01/17/2015  . Suicidal ideation   . Drug hallucinosis (HCC) 10/08/2014  . Chronic paranoid schizophrenia (HCC) 09/07/2014  . 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 Urinary retention   . Cocaine use disorder, severe, dependence (HCC)   . Essential hypertension, benign 03/28/2013  . Type 2 diabetes mellitus (HCC) 03/15/2013    Past Surgical History:  Procedure Laterality Date  . MULTIPLE TOOTH EXTRACTIONS          Home Medications    Prior to Admission medications   Medication Sig Start Date End Date Taking? Authorizing Provider  amLODipine (NORVASC) 10 MG tablet Take 1 tablet (10 mg total) by mouth daily. Patient not taking: Reported on 08/03/2018 08/03/18   Gilda Crease, MD  aspirin EC 81 MG tablet Take 1 tablet (81 mg total) by mouth daily. Patient not taking: Reported on 08/18/2018 08/09/18 08/09/19  Berton Mount I, MD  atorvastatin (LIPITOR) 40 MG tablet Take 1 tablet (40 mg total) by mouth daily. Patient not taking: Reported on 08/03/2018 08/03/18   Gilda Crease, MD  furosemide (LASIX) 20 MG tablet Take 40 mg oral daily, and 20 mg every evening Patient not taking: Reported on 08/03/2018 08/03/18   Gilda Crease, MD  Insulin Glargine (LANTUS) 100 UNIT/ML Solostar  Pen Inject 20 Units into the skin daily. 08/03/18   Gilda Crease, MD  Insulin Pen Needle 29G X MISC Use with insulin 08/03/18   Pollina, Canary Brim, MD  lisinopril (PRINIVIL,ZESTRIL) 5 MG tablet Take 1 tablet (5 mg total) by mouth daily. Patient not taking: Reported on 08/03/2018 08/03/18   Gilda Crease, MD    Family History Family History  Problem Relation Age of Onset  . Hypertension Other   . Diabetes Other      Social History Social History   Tobacco Use  . Smoking status: Current Every Day Smoker    Packs/day: 1.00    Years: 20.00    Pack years: 20.00    Types: Cigarettes  . Smokeless tobacco: Current User  Substance Use Topics  . Alcohol use: Yes    Comment: Daily Drinker   . Drug use: Yes    Frequency: 7.0 times per week    Types: "Crack" cocaine, Cocaine, Marijuana    Comment: Cocaine tonight, Marijuana "a long time"    Allergies   Haldol [haloperidol]   Review of Systems Review of Systems  Constitutional: Negative for chills and fever.  Respiratory: Positive for cough and shortness of breath.   Cardiovascular: Positive for chest pain and leg swelling. Negative for palpitations.  Gastrointestinal: Negative for abdominal pain and vomiting.  Skin: Negative for color change.  Neurological: Negative for dizziness, syncope, weakness and numbness.  All other systems reviewed and are negative.    Physical Exam Updated Vital Signs BP (!) 142/85 (BP Location: Right Arm)   Pulse 94   Temp 98.3 F (36.8 C) (Oral)   Resp 19   SpO2 91%   Physical Exam Vitals signs and nursing note reviewed.  Constitutional:      General: He is not in acute distress.    Appearance: He is well-developed. He is not toxic-appearing.  HENT:     Head: Normocephalic and atraumatic.  Eyes:     General:        Right eye: No discharge.        Left eye: No discharge.     Conjunctiva/sclera: Conjunctivae normal.  Neck:     Musculoskeletal: Neck supple.  Cardiovascular:     Rate and Rhythm: Normal rate and regular rhythm.     Comments: 2+ DP pulses. Pulmonary:     Effort: Tachypnea (mild) present. No accessory muscle usage or respiratory distress.     Breath sounds: Decreased breath sounds (Bibasilar.) present. No wheezing, rhonchi or rales.     Comments: Initially 90-95% on RA Abdominal:     General: There is no distension.     Palpations: Abdomen is soft.     Tenderness: There is no  abdominal tenderness.  Musculoskeletal:     Comments: 1+ to 2+ symmetric pitting edema to the lower legs.  No overlying erythema or warmth.  Skin:    General: Skin is warm and dry.     Findings: No rash.  Neurological:     Mental Status: He is alert.     Comments: Clear speech.   Psychiatric:        Behavior: Behavior normal.    ED Treatments / Results  Labs (all labs ordered are listed, but only abnormal results are displayed) Labs Reviewed  BASIC METABOLIC PANEL - Abnormal; Notable for the following components:      Result Value   Potassium 3.4 (*)    Glucose, Bld 275 (*)    Calcium 8.6 (*)  All other components within normal limits  CBC - Abnormal; Notable for the following components:   RBC 4.06 (*)    Hemoglobin 10.3 (*)    HCT 35.6 (*)    MCH 25.4 (*)    MCHC 28.9 (*)    RDW 17.0 (*)    All other components within normal limits  BRAIN NATRIURETIC PEPTIDE  RAPID URINE DRUG SCREEN, HOSP PERFORMED  I-STAT TROPONIN, ED  I-STAT TROPONIN, ED    EKG EKG Interpretation  Date/Time:  Monday August 24 2018 21:54:18 EST Ventricular Rate:  94 PR Interval:  136 QRS Duration: 82 QT Interval:  368 QTC Calculation: 460 R Axis:   53 Text Interpretation:  Normal sinus rhythm Possible Left atrial enlargement Septal infarct , age undetermined T wave abnormality, consider inferolateral ischemia Abnormal ECG No significant change was found Confirmed by Glynn Octave [phone]) on 08/25/2018 12:53:41 AM   Radiology Dg Chest 2 View  Result Date: 08/24/2018 CLINICAL DATA:  Chest pain, shortness of breath EXAM: CHEST - 2 VIEW COMPARISON:  08/22/2018 FINDINGS: Cardiomegaly with vascular congestion. No overt edema. Lingular atelectasis or scarring. Small right pleural effusion. No left effusion or acute bony abnormality. IMPRESSION: Cardiomegaly with vascular congestion. Lingular scarring or atelectasis. Electronically Signed   By: Charlett Nose M.D.   On: 08/24/2018 22:53    Procedures Procedures (including critical care time)  Medications Ordered in ED Medications  sodium chloride flush (NS) 0.9 % injection 3 mL (has no administration in time range)   Prior Records Reviewed:  Echocardiogram 03/16/18: Study Conclusions  - Left ventricle: The cavity size was normal. There was severe   concentric hypertrophy. Systolic function was moderately to   severely reduced. The estimated ejection fraction was in the   range of 30% to 35%. There is hypokinesis of the inferoseptal   myocardium. Doppler parameters are consistent with a reversible   restrictive pattern, indicative of decreased left ventricular   diastolic compliance and/or increased left atrial pressure (grade   3 diastolic dysfunction). - Aortic valve: Trileaflet; mildly thickened, mildly calcified   leaflets. There was mild regurgitation. - Mitral valve: There was moderate regurgitation. - Left atrium: The atrium was severely dilated.  Impressions:  - EF is reduced when compared to prior.   Initial Impression / Assessment and Plan / ED Course  I have reviewed the triage vital signs and the nursing notes.  Pertinent labs & imaging results that were available during my care of the patient were reviewed by me and considered in my medical decision making (see chart for details).   Patient with history of CHF last EF 30 to 35% who presents to the emergency department with complaints of shortness of breath, chest discomfort, cough, and bilateral lower extremity edema. Nontoxic appearing, appears mildly tachypneic at times with SPO2 ranging 90-95% on RA. Afebrile. Exam with decreased bibasilar breath sounds and lower extremity edema.  Work-up reviewed:  CBC: No leukocytosis. Anemia w/ hgb/hct within prior ranges.  BMP: Mild hypokalemia- oral replacement ordered. Hyperglycemia @ 275 no acidosis or anion gap elevation, mild hypocalcemia @ 8.6.  Trop: negative x 2 BNP: Elevated at 1103 CXR:  Cardiomegaly w/ vascular congestion.   Concern for acute CHF exacerbation, Lasix & potassium administered. Patient with borderline O2 saturations on RA, feel he warrants admission at this time. Multiple recent admissions during which patient has left AMA, he is adamant that he wants to stay. Findings and plan of care discussed with supervising physician Dr. Manus Gunning who personally evaluated  and examined this patient & is in agreement with plan for admission.   03:20: CONSULT: Discussed case with hospitalist Dr. Antionette Char who accepts admission.   Final Clinical Impressions(s) / ED Diagnoses   Final diagnoses:  Acute congestive heart failure, unspecified heart failure type [facility])    ED Discharge Orders    None       Cherly Anderson, PA-C 08/25/18 0342    Glynn Octave, MD 08/25/18 0730

## 2018-08-25 NOTE — ED Notes (Signed)
Breakfast Tray Ordered @0810 , 08/25/2018; Brittaney Beaulieu V.

## 2018-08-25 NOTE — Care Management Note (Signed)
Case Management Note  Patient Details  Name: [name] MRN: [id number] Date of Birth: [birth year]  Subjective/Objective:   CHF               Action/Plan: 08/25/2018- Patient is very well known to me from previous admission; CM will continue to follow for progression of care; pt is independent of all of his ADL; B Shelba Flake   Action/Plan: 07/30/2018 - CM talked to patient at the bedside; he is homeless and states that he has been homeless since March ( when he was released from Prison); prior to that he states that he was in 4 different Group Homes and want to return to one at discharge; SW is aware; has Media planner with Medicaid/ with prescription drug coverage; 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 Attending MD at discharge please send scripts to [facility] pharmacy. CM will continue to follow for progression of care.  Expected Discharge Date:    possibly 08/28/2018              Expected Discharge Plan:   undetermined - patient leaves AMA multiple times when he feels better  In-House Referral:   SW  Status of Service:   In progress  Reola Mosher [phone] 08/25/2018, 11:02 AM

## 2018-08-25 NOTE — ED Notes (Signed)
Pt ambulated to bathroom 

## 2018-08-25 NOTE — ED Notes (Signed)
Report given to RN on 3E.

## 2018-08-25 NOTE — Progress Notes (Signed)
Patient arrived to unit, complains of have frequent bowel movements. Will continue to monitor.

## 2018-08-25 NOTE — ED Notes (Signed)
Breakfast Tray Ordered. 

## 2018-08-25 NOTE — Progress Notes (Signed)
Patient admitted after midnight. Hx of CHF polysubstance abuse, schizophrenia, diabetes, hep c admitted for acute respiratory failure with hypoxia secondary to acute on chronic chf in setting of non-compliance with meds. Provided with IV lasix and diuresing well.   PE Gen: awake alert unkept appearing in no acute distress EENT: very poor dentition CV: RRR no mgr 1+ LE edema Resp: BS quite distant fine crackles bilateral bases no wheeze Abd: non-distended non-tender no guarding   A/P 1. Acute respiratory failure secondary to acute on chronic combined diastolic and systolic HF in setting of non-compliance with meds. Echo 2019 with EF 30% and grade 3 diastolic dysfunction, mild AI, moderate MR and severe LAE. Elevated BNP, chest xray vascular congestion. Continue IV lasix, monitor intake and output, obtain daily weight monitor oxygen saturation level.  2. Diabetes: lantus 10u and SSI  3. Hypertension: fair control. Continue home norvasc, lisinopril  4. Schizophrenia: stable. Denies SI or hallucinations  Gwenyth Bender, NP

## 2018-08-25 NOTE — H&P (Signed)
History and Physical    [name]:[id number] DOB: 1960/12/27 DOA: 08/24/2018  PCP: Patient, No Pcp Per   Patient coming from: home   Chief Complaint: SOB, leg swelling  HPI: [name] is a 58 y.o. male with medical history significant for substance abuse, schizophrenia, hypertension, insulin-dependent diabetes mellitus, chronic combined systolic and diastolic CHF, and noncompliance with his medical treatments, now presenting to the emergency department for evaluation of leg swelling and shortness of breath.  Patient reports that he had just been released from jail, has not taken any of his medications since the recent hospital discharge, and found himself to be quite dyspneic while walking.  He also reports increased bilateral lower extremity swelling.  He had reported some chest discomfort initially in the ED, but denies that now.  He also denies fevers or chills, denies any significant cough, and denies any vomiting or diarrhea.  ED Course: Upon arrival to the ED, patient is found to be afebrile, saturating low 90s on room air while at rest, and is otherwise normal.  EKG features a sinus rhythm with T wave inversions in the inferolateral leads, similar to prior.  Chest x-ray is notable for cardiomegaly and vascular congestion.  Chemistry panel features a glucose of 275 and potassium 3.4.  CBC is notable for a chronic normocytic anemia with hemoglobin 10.3.  Troponin is normal x2 and decreasing, BNP is elevated to 1103, and UDS is positive for cocaine.  Patient was given 40 mg IV Lasix and 40 mEq oral potassium in the ED.  He has begun to diurese, but continues to desaturate into the mid and upper 80s with minimal exertion and will be observed for ongoing evaluation and management.  Review of Systems:  All other systems reviewed and apart from HPI, are negative.  Past Medical History:  Diagnosis Date  . CHF (congestive heart failure) (HCC)   . Chronic foot pain   .  Cocaine abuse (HCC)   . Diabetes mellitus without complication (HCC)   . Hepatitis C    unsure   . Homelessness   . Hypertension   . Neuropathy   . Polysubstance abuse (HCC)   . Schizophrenia (HCC)   . Sleep apnea     Past Surgical History:  Procedure Laterality Date  . MULTIPLE TOOTH EXTRACTIONS       reports that he has been smoking cigarettes. He has a 20.00 pack-year smoking history. He uses smokeless tobacco. He reports current alcohol use. He reports current drug use. Frequency: 7.00 times per week. Drugs: "Crack" cocaine, Cocaine, and Marijuana.  Allergies  Allergen Reactions  . Haldol [Haloperidol] Other (See Comments)    Muscle spasms, loss of voluntary movement. However, pt has taken Thorazine on multiple occasions with no adverse effects.     Family History  Problem Relation Age of Onset  . Hypertension Other   . Diabetes Other      Prior to Admission medications   Medication Sig Start Date End Date Taking? Authorizing Provider  amLODipine (NORVASC) 10 MG tablet Take 1 tablet (10 mg total) by mouth daily. Patient not taking: Reported on 08/03/2018 08/03/18   Gilda Crease, MD  aspirin EC 81 MG tablet Take 1 tablet (81 mg total) by mouth daily. Patient not taking: Reported on 08/18/2018 08/09/18 08/09/19  Berton Mount I, MD  atorvastatin (LIPITOR) 40 MG tablet Take 1 tablet (40 mg total) by mouth daily. Patient not taking: Reported on 08/03/2018 08/03/18   Gilda Crease, MD  furosemide (LASIX) 20 MG tablet Take 40 mg oral daily, and 20 mg every evening Patient not taking: Reported on 08/03/2018 08/03/18   Gilda Crease, MD  Insulin Glargine (LANTUS) 100 UNIT/ML Solostar Pen Inject 20 Units into the skin daily. 08/03/18   Gilda Crease, MD  Insulin Pen Needle 29G X MISC Use with insulin 08/03/18   Pollina, Canary Brim, MD  lisinopril (PRINIVIL,ZESTRIL) 5 MG tablet Take 1 tablet (5 mg total) by mouth daily. Patient not taking: Reported on  08/03/2018 08/03/18   Gilda Crease, MD    Physical Exam: Vitals:   08/24/18 2205  BP: (!) 142/85  Pulse: 94  Resp: 19  Temp: 98.3 F (36.8 C)  TempSrc: Oral  SpO2: 91%    Constitutional: NAD, calm  Eyes: PERTLA, lids and conjunctivae normal ENMT: Mucous membranes are moist. Posterior pharynx clear of any exudate or lesions.   Neck: normal, supple, no masses, no thyromegaly Respiratory: Dyspnea with speech, no wheezing, no crackles. No accessory muscle use.    Cardiovascular: S1 & S2 heard, regular rate and rhythm. Mild pretibial pitting edema b/l.   Abdomen: No distension, no tenderness, soft. Bowel sounds normal.  Musculoskeletal: no clubbing / cyanosis. No joint deformity upper and lower extremities.   Skin: no significant rashes, lesions, ulcers. Warm, dry, well-perfused. Neurologic: no facial asymmetry. Sensation intact. Moving all extremities.  Psychiatric: Alert and oriented to person, place, and situation. Calm, cooperative.    Labs on Admission: I have personally reviewed following labs and imaging studies  CBC: Recent Labs  Lab 08/18/18 0756 08/22/18 1849 08/24/18 2241  WBC 7.3 6.4 7.4  NEUTROABS 5.7 4.7  --   HGB 11.4* 11.2* 10.3*  HCT 37.1* 37.4* 35.6*  MCV 85.9 86.2 87.7  PLT 269 290 253   Basic Metabolic Panel: Recent Labs  Lab 08/18/18 0756 08/19/18 0317 08/20/18 0411 08/22/18 1849 08/24/18 2241  NA 140 139 138 138 139  K 3.6 3.8 3.6 4.0 3.4*  CL 108 106 104 107 106  CO2 25 26 25 24 26   GLUCOSE 140* 181* 167* 154* 275*  BUN 10 14 18 20 16   CREATININE 0.96 1.05 1.13 1.00 1.05  CALCIUM 8.7* 8.6* 8.7* 8.8* 8.6*  MG 2.0  --  2.0  --   --   PHOS  --   --  3.8  4.2  --   --    GFR: Estimated Creatinine Clearance: 77.6 mL/min (by C-G formula based on SCr of 1.05 mg/dL). Liver Function Tests: Recent Labs  Lab 08/18/18 0756 08/20/18 0411 08/22/18 1849  AST 23  --  28  ALT 17  --  14  ALKPHOS 79  --  87  BILITOT 0.6  --  1.1  PROT  6.0*  --  6.6  ALBUMIN 2.5* 2.3* 2.9*   No results for input(s): LIPASE, AMYLASE in the last 168 hours. No results for input(s): AMMONIA in the last 168 hours. Coagulation Profile: No results for input(s): INR, PROTIME in the last 168 hours. Cardiac Enzymes: Recent Labs  Lab 08/18/18 1414 08/18/18 2039  TROPONINI 0.03* 0.04*   BNP (last 3 results) No results for input(s): PROBNP in the last 8760 hours. HbA1C: No results for input(s): HGBA1C in the last 72 hours. CBG: Recent Labs  Lab 08/19/18 1152 08/19/18 1632 08/19/18 2133 08/20/18 0744 08/23/18 0800  GLUCAP 141* 218* 258* 176* 223*   Lipid Profile: No results for input(s): CHOL, HDL, LDLCALC, TRIG, CHOLHDL, LDLDIRECT in the last  72 hours. Thyroid Function Tests: No results for input(s): TSH, T4TOTAL, FREET4, T3FREE, THYROIDAB in the last 72 hours. Anemia Panel: No results for input(s): VITAMINB12, FOLATE, FERRITIN, TIBC, IRON, RETICCTPCT in the last 72 hours. Urine analysis:    Component Value Date/Time   COLORURINE COLORLESS (A) 08/18/2018 1102   APPEARANCEUR CLEAR 08/18/2018 1102   LABSPEC 1.004 (L) 08/18/2018 1102   PHURINE 8.0 08/18/2018 1102   GLUCOSEU NEGATIVE 08/18/2018 1102   HGBUR NEGATIVE 08/18/2018 1102   BILIRUBINUR NEGATIVE 08/18/2018 1102   KETONESUR NEGATIVE 08/18/2018 1102   PROTEINUR NEGATIVE 08/18/2018 1102   UROBILINOGEN 1.0 03/14/2015 0610   NITRITE NEGATIVE 08/18/2018 1102   LEUKOCYTESUR NEGATIVE 08/18/2018 1102   Sepsis Labs: (procalcitonin:4,lacticidven:4) )No results found for this or any previous visit (from the past 240 hour(s)).   Radiological Exams on Admission: Dg Chest 2 View  Result Date: 08/24/2018 CLINICAL DATA:  Chest pain, shortness of breath EXAM: CHEST - 2 VIEW COMPARISON:  08/22/2018 FINDINGS: Cardiomegaly with vascular congestion. No overt edema. Lingular atelectasis or scarring. Small right pleural effusion. No left effusion or acute bony abnormality.  IMPRESSION: Cardiomegaly with vascular congestion. Lingular scarring or atelectasis. Electronically Signed   By: Charlett Nose M.D.   On: 08/24/2018 22:53    EKG: Independently reviewed. Sinus rhythm, inferolateral T-wave inversions, similar to prior.   Assessment/Plan   1. Acute on chronic combined systolic & diastolic CHF  - Presents with increased b/l leg swelling and SOB in setting of not taking his medications  - ED workup consistent with acute on chronic CHF  - EF was 30-35% in September with HK of inferoseptal myocardium, grade 3 diastolic dysfunction, mild AI, moderate MR, and severe LAE  - Given 40 mg IV Lasix in ED and has begun to diurese, but continues to drop O2 sat to 80's with minimal exertion  - Continue diuresis with Lasix 40 mg IV q12h, continue ACE-i, follow I/O's and daily wts, and monitor renal function and electrolytes   2. Insulin-dependent DM  - A1c was 7.3% last month  - Managed with Lantus 20 units qHS at home  - Check CBG's, continue Lantus with dose-reduction and add a SSI with Novolog while in hospital   3. Hypertension  - BP at goal, continue lisinopril    4. Cocaine abuse  - UDS positive for cocaine this admission  - Counseled patient    5. Schizophrenia  - Not currently on medications for this  - He denies SI, HI, or hallucinations   6. Hypokalemia  - Replaced with oral potassium in ED  - Repeat chem panel in am     DVT prophylaxis: Lovenox  Code Status: Full  Family Communication: Discussed with patient  Consults called: none Admission status: Observation     Briscoe Deutscher, MD [facility] Pager [phone]  If 7PM-7AM, please contact night-coverage [email or web address] Password [facility]  08/25/2018, 3:27 AM

## 2018-08-25 NOTE — Plan of Care (Signed)
  Problem: Education: Goal: Knowledge of General Education information will improve Description: Including pain rating scale, medication(s)/side effects and non-pharmacologic comfort measures Outcome: Progressing   Problem: Health Behavior/Discharge Planning: Goal: Ability to manage health-related needs will improve Outcome: Progressing   Problem: Clinical Measurements: Goal: Respiratory complications will improve Outcome: Progressing   

## 2018-08-25 NOTE — Progress Notes (Signed)
Patient walked to nurses station requesting ice cream and graham crackers. The charge nurse reviewed the patient's diet order and it states the patient is allowed double trays but no snacks. Patient was educated regarding this and became visibly upset. He began yelling at me and the charge nurse and stating "I'm a grown man". He then states "I'm ready to leave." Patient then walked back to his room. MD notified. Security was called as a Lawyer. Staff goes to his room to present the AMA form, but then he declines to leave and sign form. Patient begins calling the staff "murderers" and telling them to leave his room. Security was able to calm the patient down at this time. Security personnel states they will round on the patient hourly.

## 2018-08-26 DIAGNOSIS — J9601 Acute respiratory failure with hypoxia: Principal | ICD-10-CM

## 2018-08-26 LAB — BASIC METABOLIC PANEL
Anion gap: 7 (ref 5–15)
BUN: 24 mg/dL — ABNORMAL HIGH (ref 6–20)
CO2: 27 mmol/L (ref 22–32)
Calcium: 8.7 mg/dL — ABNORMAL LOW (ref 8.9–10.3)
Chloride: 106 mmol/L (ref 98–111)
Creatinine, Ser: 1.09 mg/dL (ref 0.61–1.24)
GFR calc Af Amer: >60 mL/min (ref >60)
Glucose, Bld: 117 mg/dL — ABNORMAL HIGH (ref 70–99)
Potassium: 3.6 mmol/L (ref 3.5–5.1)
Sodium: 140 mmol/L (ref 135–145)

## 2018-08-26 LAB — GLUCOSE, CAPILLARY: Glucose-Capillary: 125 mg/dL — ABNORMAL HIGH (ref 70–99)

## 2018-08-26 MED ORDER — INSULIN GLARGINE 100 UNIT/ML SOLOSTAR PEN: 10.0000 [IU] | PEN_INJECTOR | Freq: Every day | SUBCUTANEOUS | 0 refills | Status: DC

## 2018-08-26 MED ORDER — FUROSEMIDE 40 MG PO TABS: 40.0000 mg | ORAL_TABLET | Freq: Two times a day (BID) | ORAL | Status: DC

## 2018-08-26 MED ORDER — BISACODYL 5 MG PO TBEC: 5.0000 mg | DELAYED_RELEASE_TABLET | Freq: Every day | ORAL | 0 refills | Status: DC | PRN

## 2018-08-26 MED ORDER — POTASSIUM CHLORIDE ER 20 MEQ PO TBCR: 20.0000 meq | EXTENDED_RELEASE_TABLET | Freq: Every day | ORAL | 0 refills | Status: DC

## 2018-08-26 MED ORDER — FUROSEMIDE 40 MG PO TABS: 40.0000 mg | ORAL_TABLET | Freq: Two times a day (BID) | ORAL | 0 refills | Status: DC

## 2018-08-26 MED ORDER — NICOTINE 21 MG/24HR TD PT24: 21.0000 mg | MEDICATED_PATCH | Freq: Every day | TRANSDERMAL | 0 refills | Status: DC

## 2018-08-26 NOTE — Discharge Summary (Signed)
Physician Discharge Summary  [name] TWK:[id number] DOB: 01/16/1961 DOA: 08/24/2018  PCP: Patient, No Pcp Per  Admit date: 08/24/2018 Discharge date: 08/26/2018  Admitted From: home Discharge disposition: home   Recommendations for Outpatient Follow-Up:   1. BMP 1 week 2. Patient to go live with cousin 3. Daily weights   Discharge Diagnosis:   Principal Problem:   Acute respiratory failure with hypoxia (HCC) Active Problems:   Insulin-requiring or dependent type II diabetes mellitus (HCC)   Essential hypertension, benign   Cocaine use disorder, severe, dependence (HCC)   Chronic paranoid schizophrenia (HCC)   Prostate enlargement   Acute on chronic combined systolic (congestive) and diastolic (congestive) heart failure (HCC)   Homelessness    Discharge Condition: Improved.  Diet recommendation: Low sodium, heart healthy.  Carbohydrate-modified  Wound care: None.  Code status: Full.   History of Present Illness:   [name] is a 58 y.o. male with medical history significant for substance abuse, schizophrenia, hypertension, insulin-dependent diabetes mellitus, chronic combined systolic and diastolic CHF, and noncompliance with his medical treatments, now presenting to the emergency department for evaluation of leg swelling and shortness of breath.  Patient reports that he had just been released from jail, has not taken any of his medications since the recent hospital discharge, and found himself to be quite dyspneic while walking.  He also reports increased bilateral lower extremity swelling.  He had reported some chest discomfort initially in the ED, but denies that now.  He also denies fevers or chills, denies any significant cough, and denies any vomiting or diarrhea.   Hospital Course by Problem:   1. Acute respiratory failure secondary to acute on chronic combined diastolic and systolic HF in setting of non-compliance with meds. Echo  2019 with EF 30% and grade 3 diastolic dysfunction, mild AI, moderate MR and severe LAE. Elevated BNP, chest xray vascular congestion.  -diuresed well -off O2 -feeling better -wants to be d/c'd but with prescriptions -plans to live with family and weight self daily  2. Diabetes:  -resume home regimen -encouraged compliance  3. Hypertension:  - Continue home norvasc, lisinopril -add lasix  4. Schizophrenia: stable. Denies SI or hallucinations    Medical Consultants:      Discharge Exam:   Vitals:   08/26/18 0447 08/26/18 0507  BP:  140/89  Pulse: 90 94  Resp:    Temp:    SpO2: 99%    Vitals:   08/25/18 1951 08/26/18 0441 08/26/18 0447 08/26/18 0507  BP: 129/86   140/89  Pulse: 95  90 94  Resp: 18     Temp: 98.2 F (36.8 C)     TempSrc: Oral     SpO2: 94%  99%   Weight:  84.4 kg    Height:        General exam: Appears calm and comfortable. Walking around room  The results of significant diagnostics from this hospitalization (including imaging, microbiology, ancillary and laboratory) are listed below for reference.     Procedures and Diagnostic Studies:   Dg Chest 2 View  Result Date: 08/24/2018 CLINICAL DATA:  Chest pain, shortness of breath EXAM: CHEST - 2 VIEW COMPARISON:  08/22/2018 FINDINGS: Cardiomegaly with vascular congestion. No overt edema. Lingular atelectasis or scarring. Small right pleural effusion. No left effusion or acute bony abnormality. IMPRESSION: Cardiomegaly with vascular congestion. Lingular scarring or atelectasis. Electronically Signed   By: Charlett Nose M.D.   On: 08/24/2018 22:53  Labs:   Basic Metabolic Panel: Recent Labs  Lab 08/20/18 0411 08/22/18 1849 08/24/18 2241 08/26/18 0453  NA 138 138 139 140  K 3.6 4.0 3.4* 3.6  CL 104 107 106 106  CO2 25 24 26 27   GLUCOSE 167* 154* 275* 117*  BUN 18 20 16  24*  CREATININE 1.13 1.00 1.05 1.09  CALCIUM 8.7* 8.8* 8.6* 8.7*  MG 2.0  --   --   --   PHOS 3.8  4.2  --    --   --    GFR Estimated Creatinine Clearance: 74.8 mL/min (by C-G formula based on SCr of 1.09 mg/dL). Liver Function Tests: Recent Labs  Lab 08/20/18 0411 08/22/18 1849  AST  --  28  ALT  --  14  ALKPHOS  --  87  BILITOT  --  1.1  PROT  --  6.6  ALBUMIN 2.3* 2.9*   No results for input(s): LIPASE, AMYLASE in the last 168 hours. No results for input(s): AMMONIA in the last 168 hours. Coagulation profile No results for input(s): INR, PROTIME in the last 168 hours.  CBC: Recent Labs  Lab 08/22/18 1849 08/24/18 2241  WBC 6.4 7.4  NEUTROABS 4.7  --   HGB 11.2* 10.3*  HCT 37.4* 35.6*  MCV 86.2 87.7  PLT 290 253   Cardiac Enzymes: No results for input(s): CKTOTAL, CKMB, CKMBINDEX, TROPONINI in the last 168 hours. BNP: Invalid input(s): POCBNP CBG: Recent Labs  Lab 08/23/18 0800 08/25/18 1237 08/25/18 1627 08/25/18 2106 08/26/18 0628  GLUCAP 223* 200* 134* 273* 125*   D-Dimer No results for input(s): DDIMER in the last 72 hours. Hgb A1c No results for input(s): HGBA1C in the last 72 hours. Lipid Profile No results for input(s): CHOL, HDL, LDLCALC, TRIG, CHOLHDL, LDLDIRECT in the last 72 hours. Thyroid function studies No results for input(s): TSH, T4TOTAL, T3FREE, THYROIDAB in the last 72 hours.  Invalid input(s): FREET3 Anemia work up No results for input(s): VITAMINB12, FOLATE, FERRITIN, TIBC, IRON, RETICCTPCT in the last 72 hours. Microbiology No results found for this or any previous visit (from the past 240 hour(s)).   Discharge Instructions:   Discharge Instructions    (HEART FAILURE PATIENTS) Call MD:  Anytime you have any of the following symptoms: 1) 3 pound weight gain in 24 hours or 5 pounds in 1 week 2) shortness of breath, with or without a dry hacking cough 3) swelling in the hands, feet or stomach 4) if you have to sleep on extra pillows at night in order to breathe.   Complete by:  As directed    Diet - low sodium heart healthy   Complete  by:  As directed    Fluid restriction   Heart Failure patients record your daily weight using the same scale at the same time of day   Complete by:  As directed    Increase activity slowly   Complete by:  As directed      Allergies as of 08/26/2018      Reactions   Haldol [haloperidol] Other (See Comments)   Muscle spasms, loss of voluntary movement. However, pt has taken Thorazine on multiple occasions with no adverse effects.       Medication List    TAKE these medications   amLODipine 10 MG tablet Commonly known as:  NORVASC Take 1 tablet (10 mg total) by mouth daily.   aspirin EC 81 MG tablet Take 1 tablet (81 mg total) by mouth daily.  atorvastatin 40 MG tablet Commonly known as:  LIPITOR Take 1 tablet (40 mg total) by mouth daily.   bisacodyl 5 MG EC tablet Commonly known as:  DULCOLAX Take 1 tablet (5 mg total) by mouth daily as needed for moderate constipation.   furosemide 40 MG tablet Commonly known as:  LASIX Take 1 tablet (40 mg total) by mouth 2 (two) times daily. What changed:    medication strength  how much to take  how to take this  when to take this  additional instructions   Insulin Glargine 100 UNIT/ML Solostar Pen Commonly known as:  LANTUS Inject 10 Units into the skin daily. What changed:  how much to take   Insulin Pen Needle 29G X Misc Use with insulin   lisinopril 5 MG tablet Commonly known as:  PRINIVIL,ZESTRIL Take 1 tablet (5 mg total) by mouth daily.   nicotine 21 mg/24hr patch Commonly known as:  NICODERM CQ - dosed in mg/24 hours Place 1 patch (21 mg total) onto the skin daily.   Potassium Chloride ER 20 MEQ Tbcr Take 20 mEq by mouth daily.         Time coordinating discharge: 35 min  Signed:  Joseph Art DO  [facility] 08/26/2018, 8:57 AM

## 2018-08-26 NOTE — Progress Notes (Signed)
patient has received discharged teaching with instruction papers  telle and Iv removed. Bus pass given and patient stated that he will catch bus to [facility] on [address].

## 2018-08-27 ENCOUNTER — Emergency Department (HOSPITAL_COMMUNITY)
Admission: EM | Admit: 2018-08-27 | Discharge: 2018-08-27 | Disposition: A | Discharge status: Home / Self Care | Payer: Medicaid Other | Attending: Emergency Medicine | Admitting: Emergency Medicine

## 2018-08-27 ENCOUNTER — Encounter (HOSPITAL_COMMUNITY): Payer: Self-pay | Admitting: Emergency Medicine

## 2018-08-27 DIAGNOSIS — I110 Hypertensive heart disease with heart failure: Secondary | ICD-10-CM | POA: Diagnosis not present

## 2018-08-27 DIAGNOSIS — I5043 Acute on chronic combined systolic (congestive) and diastolic (congestive) heart failure: Secondary | ICD-10-CM | POA: Insufficient documentation

## 2018-08-27 DIAGNOSIS — R739 Hyperglycemia, unspecified: Secondary | ICD-10-CM

## 2018-08-27 DIAGNOSIS — E1165 Type 2 diabetes mellitus with hyperglycemia: Secondary | ICD-10-CM | POA: Insufficient documentation

## 2018-08-27 DIAGNOSIS — F17210 Nicotine dependence, cigarettes, uncomplicated: Secondary | ICD-10-CM | POA: Diagnosis not present

## 2018-08-27 DIAGNOSIS — R252 Cramp and spasm: Secondary | ICD-10-CM | POA: Diagnosis present

## 2018-08-27 LAB — CBG MONITORING, ED
GLUCOSE-CAPILLARY: 85 mg/dL (ref 70–99)
Glucose-Capillary: 321 mg/dL — ABNORMAL HIGH (ref 70–99)

## 2018-08-27 MED ORDER — INSULIN ASPART 100 UNIT/ML ~~LOC~~ SOLN
5.0000 [IU] | Freq: Once | SUBCUTANEOUS | Status: AC
  Administered 2018-08-27: 5 [IU] via SUBCUTANEOUS
  Filled 2018-08-27: qty 1

## 2018-08-27 MED ORDER — ACETAMINOPHEN 325 MG PO TABS
650.0000 mg | ORAL_TABLET | Freq: Once | ORAL | Status: AC
  Administered 2018-08-27: 650 mg via ORAL
  Filled 2018-08-27: qty 2

## 2018-08-27 NOTE — ED Provider Notes (Signed)
[facility]   CSN: [id number] Arrival date & time: 08/27/18  1825    History   Chief Complaint Chief Complaint  Patient presents with  . Hyperglycemia    HPI [name] is a 58 y.o. male.     The history is provided by the patient and medical records. No language interpreter was used.     58 year old male with hx of homelessness, schizophrenia, diabetes, CHF, polysubstance abuse, high utilizer of the ER recently admitted for acute on chronic CHF and discharged today presenting with multiple complaints.  Patient states did not take his insulin today, admits to smoking crack today.  Is complaining of cramps to both hands and requesting for Tylenol.  He also reports urinary incontinence and states that secondary to his enlarged prostate, this is not new.  He noticed that his blood sugar is high and attributed to not taking his medication.  He is requesting for orange juice and soap.  He was recently admitted for acute on chronic CHF.  Currently states he does not have any chest pain, trouble breathing or productive cough.    Past Medical History:  Diagnosis Date  . CHF (congestive heart failure) (HCC)   . Chronic foot pain   . Cocaine abuse (HCC)   . Diabetes mellitus without complication (HCC)   . Hepatitis C    unsure   . Homelessness   . Hypertension   . Neuropathy   . Polysubstance abuse (HCC)   . Schizophrenia (HCC)   . Sleep apnea     Patient Active Problem List   Diagnosis Date Noted  . Acute on chronic combined systolic and diastolic CHF (congestive heart failure) (HCC) 08/25/2018  . Acute respiratory failure with hypoxia (HCC) 08/25/2018  . Acute on chronic combined systolic and diastolic congestive heart failure (HCC) 08/18/2018  . Tobacco use 08/18/2018  . 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 Homelessness 08/08/2018  . Smoker  08/08/2018  . CHF (congestive heart failure) (HCC) 07/15/2018  . Acute exacerbation of CHF (congestive heart failure) (HCC) 03/28/2018  . Prostate enlargement 03/16/2018  . Aortic atherosclerosis (HCC) 03/16/2018  . 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 Chronic foot pain   . Schizoaffective disorder, bipolar type (HCC) 09/30/2016  . Substance induced mood disorder (HCC) 03/13/2015  . Acute kidney failure (HCC) 01/26/2015  . Schizophrenia, paranoid type (HCC) 01/17/2015  . Suicidal ideation   . Drug hallucinosis (HCC) 10/08/2014  . Chronic paranoid schizophrenia (HCC) 09/07/2014  . 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 Urinary retention   . Cocaine use disorder, severe, dependence (HCC)   . Essential hypertension, benign 03/28/2013  . 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 MULTIPLE TOOTH EXTRACTIONS          Home Medications    Prior to Admission medications   Medication Sig Start Date End Date Taking? Authorizing Provider  amLODipine (NORVASC) 10 MG tablet Take 1 tablet (10 mg total) by mouth daily. Patient not taking: Reported on 08/25/2018 08/03/18   Gilda Crease, MD  aspirin EC 81 MG tablet Take 1 tablet (81 mg total) by mouth daily. Patient not taking: Reported on 08/18/2018 08/09/18 08/09/19  Berton Mount I, MD  atorvastatin (LIPITOR) 40 MG tablet Take 1 tablet (40 mg total) by  mouth daily. Patient not taking: Reported on 08/25/2018 08/03/18   Gilda Crease, MD  bisacodyl (DULCOLAX) 5 MG EC tablet Take 1 tablet (5 mg total) by mouth daily as needed for moderate constipation. 08/26/18   Joseph Art, DO  furosemide (LASIX) 40 MG tablet Take 1 tablet (40 mg total) by mouth 2 (two) times daily. 08/26/18   Joseph Art, DO  Insulin Glargine (LANTUS) 100 UNIT/ML Solostar Pen Inject 10  Units into the skin daily. 08/26/18   Joseph Art, DO  Insulin Pen Needle 29G X MISC Use with insulin 08/03/18   Pollina, Canary Brim, MD  lisinopril (PRINIVIL,ZESTRIL) 5 MG tablet Take 1 tablet (5 mg total) by mouth daily. Patient not taking: Reported on 08/25/2018 08/03/18   Gilda Crease, MD  nicotine (NICODERM CQ - DOSED IN MG/24 HOURS) 21 mg/24hr patch Place 1 patch (21 mg total) onto the skin daily. 08/26/18   Joseph Art, DO  potassium chloride 20 MEQ TBCR Take 20 mEq by mouth daily. 08/26/18   Joseph Art, DO    Family History Family History  Problem Relation Age of Onset  . Hypertension Other   . Diabetes Other     Social History Social History   Tobacco Use  . Smoking status: Current Every Day Smoker    Packs/day: 1.00    Years: 20.00    Pack years: 20.00    Types: Cigarettes  . Smokeless tobacco: Current User  Substance Use Topics  . Alcohol use: Yes    Comment: Daily Drinker   . Drug use: Yes    Frequency: 7.0 times per week    Types: "Crack" cocaine, Cocaine, Marijuana    Comment: Cocaine tonight, Marijuana "a long time"     Allergies   Haldol [haloperidol]   Review of Systems Review of Systems  All other systems reviewed and are negative.    Physical Exam Updated Vital Signs BP (!) 171/81 (BP Location: Left Arm)   Pulse 83   Temp 98 F (36.7 C) (Oral)   Resp 20   SpO2 98%   Physical Exam Vitals signs and nursing note reviewed.  Constitutional:      General: He is not in acute distress.    Appearance: He is well-developed.  HENT:     Head: Atraumatic.  Eyes:     Conjunctiva/sclera: Conjunctivae normal.  Neck:     Musculoskeletal: Neck supple.  Cardiovascular:     Rate and Rhythm: Normal rate and regular rhythm.     Pulses: Normal pulses.     Heart sounds: Normal heart sounds.  Pulmonary:     Effort: Pulmonary effort is normal.     Breath sounds: Normal breath sounds.  Abdominal:     Palpations: Abdomen is soft.       Tenderness: There is no abdominal tenderness.  Musculoskeletal:        General: No tenderness (No tenderness about the hands bilaterally.).  Skin:    Findings: No rash.  Neurological:     Mental Status: He is alert and oriented to person, place, and time.      ED Treatments / Results  Labs (all labs ordered are listed, but only abnormal results are displayed) Labs Reviewed  CBG MONITORING, ED - Abnormal; Notable for the following components:      Result Value   Glucose-Capillary 321 (*)    All other components within normal limits  CBG MONITORING, ED    EKG None  Radiology No results found.  Procedures Procedures (including critical care time)  Medications Ordered in ED Medications - No data to display   Initial Impression / Assessment and Plan / ED Course  I have reviewed the triage vital signs and the nursing notes.  Pertinent labs & imaging results that were available during my care of the patient were reviewed by me and considered in my medical decision making (see chart for details).        BP (!) 155/99   Pulse (!) 101   Temp 98 F (36.7 C) (Oral)   Resp 20   SpO2 93%    Final Clinical Impressions(s) / ED Diagnoses   Final diagnoses:  Hyperglycemia    ED Discharge Orders    None     8:42 PM Pt is homeless, has ED care plan.  Was discharged today after developed acute on chronic CHF exacerbation.  No report of CP, SOB.  Request food to eat, admits to not taking his insulin, and also request tylenol for hand cramps.  He is overall well appearing.  Initial CBG 327, will give insulin.  Tylenol for hand pain, not reproducible on exam.  Urinary incontinence is an ongoing issue for more than a year.  He can f/u with PCP for further management.   10:51 PM Repeat CBG is 85, pt requesting for a sandwich and a bus pass.  Sandwich given, pt stable for discharge.    Fayrene Helper, PA-C 08/27/18 2253    Derwood Kaplan, MD 08/27/18 [phone]

## 2018-08-27 NOTE — Discharge Instructions (Signed)
Make sure to take your diabetic medications as prescribed.  Avoid drug use as it will harm your overall health.  Follow up with your doctor for further care.

## 2018-08-27 NOTE — ED Notes (Signed)
Patient is being confrontation with staff. Patient will be escorted out by police officers. Patient will not sign discharge paperwork due to mental state of patient. Patient is in drug induced state due to crack patient took.

## 2018-08-27 NOTE — ED Triage Notes (Addendum)
Per EMS-states he was discharged from [facility] today-states he didn't get his insulin and smoked crack-states CBG 487-states he is incontinent of urine due to an enlarged prostate and that is why he is here

## 2018-08-28 ENCOUNTER — Emergency Department (HOSPITAL_COMMUNITY)
Admission: EM | Admit: 2018-08-28 | Discharge: 2018-08-28 | Disposition: A | Discharge status: Home / Self Care | Payer: Medicaid Other | Attending: Emergency Medicine | Admitting: Emergency Medicine

## 2018-08-28 DIAGNOSIS — E119 Type 2 diabetes mellitus without complications: Secondary | ICD-10-CM | POA: Diagnosis not present

## 2018-08-28 DIAGNOSIS — I110 Hypertensive heart disease with heart failure: Secondary | ICD-10-CM | POA: Diagnosis not present

## 2018-08-28 DIAGNOSIS — Z9189 Other specified personal risk factors, not elsewhere classified: Secondary | ICD-10-CM

## 2018-08-28 DIAGNOSIS — F209 Schizophrenia, unspecified: Secondary | ICD-10-CM | POA: Insufficient documentation

## 2018-08-28 DIAGNOSIS — I509 Heart failure, unspecified: Secondary | ICD-10-CM | POA: Diagnosis not present

## 2018-08-28 DIAGNOSIS — Z590 Homelessness: Secondary | ICD-10-CM | POA: Diagnosis not present

## 2018-08-28 DIAGNOSIS — Z9119 Patient's noncompliance with other medical treatment and regimen: Secondary | ICD-10-CM | POA: Diagnosis not present

## 2018-08-28 DIAGNOSIS — F1910 Other psychoactive substance abuse, uncomplicated: Secondary | ICD-10-CM | POA: Diagnosis not present

## 2018-08-28 DIAGNOSIS — F17210 Nicotine dependence, cigarettes, uncomplicated: Secondary | ICD-10-CM | POA: Diagnosis not present

## 2018-08-28 LAB — CBG MONITORING, ED: GLUCOSE-CAPILLARY: 235 mg/dL — AB (ref 70–99)

## 2018-08-28 MED ORDER — AMLODIPINE BESYLATE 5 MG PO TABS
10.0000 mg | ORAL_TABLET | Freq: Once | ORAL | Status: AC
  Administered 2018-08-28: 10 mg via ORAL
  Filled 2018-08-28: qty 2

## 2018-08-28 MED ORDER — INSULIN GLARGINE 100 UNIT/ML ~~LOC~~ SOLN
10.0000 [IU] | Freq: Once | SUBCUTANEOUS | Status: DC
  Filled 2018-08-28: qty 0.1

## 2018-08-28 MED ORDER — LISINOPRIL 10 MG PO TABS
5.0000 mg | ORAL_TABLET | Freq: Once | ORAL | Status: AC
  Administered 2018-08-28: 5 mg via ORAL
  Filled 2018-08-28: qty 1

## 2018-08-28 MED ORDER — FUROSEMIDE 40 MG PO TABS
40.0000 mg | ORAL_TABLET | Freq: Once | ORAL | Status: AC
  Administered 2018-08-28: 40 mg via ORAL
  Filled 2018-08-28 (×2): qty 1

## 2018-08-28 MED ORDER — ACETAMINOPHEN 500 MG PO TABS
1000.0000 mg | ORAL_TABLET | Freq: Once | ORAL | Status: AC
  Administered 2018-08-28: 1000 mg via ORAL
  Filled 2018-08-28: qty 2

## 2018-08-28 NOTE — ED Triage Notes (Signed)
Pt brought in by [employer] after presenting with suicidal ideation at [facility] Pt IVCed by [employer]. Per IVC paperwork pt presented to "[name] with suicidal ideatrion, "I'm going to kill myself by drug use or the cops will kill me within 72 hours I will be dead." Pt off of mental health and regular medications. Pt is a daily alcohol and crack cocaine user. Pt is a danger to self and others."

## 2018-08-28 NOTE — Discharge Instructions (Signed)
1.  It is extremely important that you get your medications filled and take them as prescribed.  If you are having difficulty finding a family doctor, go to the [facility] wellness [facility] and fill out new patient paperwork. 2.  Continue to use the provided outpatient resources for substance abuse. 3.  Follow-up with your

## 2018-08-28 NOTE — ED Provider Notes (Signed)
[facility] Provider Note   CSN: [id number] Arrival date & time: 08/28/18  1548    History   Chief Complaint Chief Complaint  Patient presents with  . Suicidal    IVC    HPI [name] is a 58 y.o. male.     HPI Patient has complex medical history with multiple medical problems and long social and psychiatric history.  Patient is seen frequently in the emergency department.  Patient was sent from [facility] with IVC for suicidal ideation and drug abuse.  Patient reports that he has absolutely no desire to hurt himself.  He reports was going to kill him is his congestive heart failure and drug abuse.  He reports that he does not want to use crack but his symptoms he is back on the street that seems to invariably happen.  He reports what he really wants is to get into a long-term inpatient drug rehab facility patient reports that he would like to go to the state hospital or any facility no matter where just to get drug treatment.  Patient is speaking very quickly.  He reports he is here to get his "FL2" or to have his "Medicare" used to pay for his inpatient treatment program.  He reports if he goes he would like to get his medications because he has not gotten them from the pharmacy yet and some Tylenol and then use outpatient resources to get set up for a drug treatment.  He reports however his greatest goal is to get inpatient drug therapy.  It was not specifically written on the [facility] information but reportedly they had concern for elevated blood pressure as cause for sending the patient to the emergency department.  No numbers of blood pressure are documented.  Patient reports that "it is not my high blood pressure or high blood sugar that is going to kill me, its the congestive heart failure with shortness of breath and crack abuse."  Past Medical History:  Diagnosis Date  . CHF (congestive heart failure) (HCC)   . Chronic foot pain   .  Cocaine abuse (HCC)   . Diabetes mellitus without complication (HCC)   . Hepatitis C    unsure   . Homelessness   . Hypertension   . Neuropathy   . Polysubstance abuse (HCC)   . Schizophrenia (HCC)   . Sleep apnea     Patient Active Problem List   Diagnosis Date Noted  . Acute on chronic combined systolic and diastolic CHF (congestive heart failure) (HCC) 08/25/2018  . Acute respiratory failure with hypoxia (HCC) 08/25/2018  . Acute on chronic combined systolic and diastolic congestive heart failure (HCC) 08/18/2018  . Tobacco use 08/18/2018  . 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 Homelessness 08/08/2018  . Smoker 08/08/2018  . CHF (congestive heart failure) (HCC) 07/15/2018  . Acute exacerbation of CHF (congestive heart failure) (HCC) 03/28/2018  . Prostate enlargement 03/16/2018  . Aortic atherosclerosis (HCC) 03/16/2018  . 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 Chronic foot pain   . Schizoaffective disorder, bipolar type (HCC) 09/30/2016  . Substance induced mood disorder (HCC) 03/13/2015  . Acute kidney failure (HCC) 01/26/2015  . Schizophrenia, paranoid type (HCC) 01/17/2015  . Suicidal ideation   . Drug hallucinosis (HCC) 10/08/2014  . Chronic paranoid schizophrenia (HCC) 09/07/2014  . 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 Urinary retention   .  Cocaine use disorder, severe, dependence (HCC)   . Essential hypertension, benign 03/28/2013  . 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 MULTIPLE TOOTH EXTRACTIONS          Home Medications    Prior to Admission medications   Medication Sig Start Date End Date Taking? Authorizing Provider  amLODipine (NORVASC) 10 MG tablet Take 1 tablet (10 mg total) by mouth daily. Patient not  taking: Reported on 08/25/2018 08/03/18   Gilda Crease, MD  aspirin EC 81 MG tablet Take 1 tablet (81 mg total) by mouth daily. Patient not taking: Reported on 08/18/2018 08/09/18 08/09/19  Berton Mount I, MD  atorvastatin (LIPITOR) 40 MG tablet Take 1 tablet (40 mg total) by mouth daily. Patient not taking: Reported on 08/25/2018 08/03/18   Gilda Crease, MD  bisacodyl (DULCOLAX) 5 MG EC tablet Take 1 tablet (5 mg total) by mouth daily as needed for moderate constipation. Patient not taking: Reported on 08/28/2018 08/26/18   Joseph Art, DO  furosemide (LASIX) 40 MG tablet Take 1 tablet (40 mg total) by mouth 2 (two) times daily. Patient not taking: Reported on 08/28/2018 08/26/18   Joseph Art, DO  Insulin Glargine (LANTUS) 100 UNIT/ML Solostar Pen Inject 10 Units into the skin daily. Patient not taking: Reported on 08/28/2018 08/26/18   Joseph Art, DO  Insulin Pen Needle 29G X MISC Use with insulin 08/03/18   Gilda Crease, MD  lisinopril (PRINIVIL,ZESTRIL) 5 MG tablet Take 1 tablet (5 mg total) by mouth daily. Patient not taking: Reported on 08/25/2018 08/03/18   Gilda Crease, MD  nicotine (NICODERM CQ - DOSED IN MG/24 HOURS) 21 mg/24hr patch Place 1 patch (21 mg total) onto the skin daily. Patient not taking: Reported on 08/28/2018 08/26/18   Joseph Art, DO  potassium chloride 20 MEQ TBCR Take 20 mEq by mouth daily. Patient not taking: Reported on 08/28/2018 08/26/18   Joseph Art, DO    Family History Family History  Problem Relation Age of Onset  . Hypertension Other   . Diabetes Other     Social History Social History   Tobacco Use  . Smoking status: Current Every Day Smoker    Packs/day: 1.00    Years: 20.00    Pack years: 20.00    Types: Cigarettes  . Smokeless tobacco: Current User  Substance Use Topics  . Alcohol use: Yes    Comment: Daily Drinker   . Drug use: Yes    Frequency: 7.0 times per week    Types: "Crack"  cocaine, Cocaine, Marijuana    Comment: Cocaine tonight, Marijuana "a long time"     Allergies   Haldol [haloperidol]   Review of Systems Review of Systems 10 Systems reviewed and are negative for acute change except as noted in the HPI.   Physical Exam Updated Vital Signs BP (!) 157/96 (BP Location: Left Arm)   Pulse (!) 111   Temp 98.2 F (36.8 C) (Oral)   Resp 20   Ht 5\' 9"  (1.753 m)   Wt 84.4 kg   SpO2 96%   BMI 27.47 kg/m   Physical Exam Constitutional:      Comments: Patient is alert and interactive.  He has no acute respiratory distress.  Speech is pressured and fast but content is actually quite focused and shows insight.  HENT:     Head: Normocephalic and  atraumatic.     Mouth/Throat:     Mouth: Mucous membranes are moist.     Pharynx: Oropharynx is clear.  Eyes:     Extraocular Movements: Extraocular movements intact.  Neck:     Musculoskeletal: Neck supple.  Cardiovascular:     Rate and Rhythm: Normal rate and regular rhythm.  Pulmonary:     Comments: Patient has no respiratory distress at rest.  His lung sounds are clear. Abdominal:     General: There is no distension.     Palpations: Abdomen is soft.     Tenderness: There is no abdominal tenderness. There is no guarding.  Musculoskeletal:     Comments: 1+ pitting at ankles.  Calves soft and nontender.  Skin:    General: Skin is warm and dry.  Neurological:     General: No focal deficit present.     Mental Status: He is oriented to person, place, and time.     Coordination: Coordination normal.  Psychiatric:     Comments: Patient's mood is elevated.  He is speaking quickly.  He is very interactive.  He is doing a lot of gesturing with his hands.  He however is situationally oriented and does not seem to be hallucinating.      ED Treatments / Results  Labs (all labs ordered are listed, but only abnormal results are displayed) Labs Reviewed  CBG MONITORING, ED - Abnormal; Notable for the  following components:      Result Value   Glucose-Capillary 235 (*)    All other components within normal limits  CBG MONITORING, ED    EKG None  Radiology No results found.  Procedures Procedures (including critical care time)  Medications Ordered in ED Medications  insulin glargine (LANTUS) injection 10 Units (has no administration in time range)  furosemide (LASIX) tablet 40 mg (40 mg Oral Given 08/28/18 1700)  amLODipine (NORVASC) tablet 10 mg (10 mg Oral Given 08/28/18 1700)  lisinopril (PRINIVIL,ZESTRIL) tablet 5 mg (5 mg Oral Given 08/28/18 1701)  acetaminophen (TYLENOL) tablet 1,000 mg (1,000 mg Oral Given 08/28/18 1700)     Initial Impression / Assessment and Plan / ED Course  I have reviewed the triage vital signs and the nursing notes.  Pertinent labs & imaging results that were available during my care of the patient were reviewed by me and considered in my medical decision making (see chart for details).       Patient is endorsing absolutely no endorsement of intention to hurt or kill himself.  He actually expresses significant interest in getting treatment for his chronic medical problems and his drug abuse.  He is concerned that these are the conditions that are going to inadvertently kill him.  Patient expresses a fair amount of insight into both the financial and social side of getting treatment and hurdles that he faces in terms of apparent upcoming court dates and charges.  Medically he is stable.  Blood pressures are in the 150s over 90s and CBG is 235.  Review of diagnostic studies shows that as of 2 days ago patient had normal renal function and normal electrolytes.  Clinically no indication this is changed.  Considering he reports he has not had his medications since discharge, this is surprisingly well controlled.  Chronic noncompliance is a significant issue for this patient.  He is however medically stable for discharge and medically stable for outpatient  psychiatric or drug therapy treatment.  He however expresses intent to continue to work towards getting  established with outpatient drug treatment centers.  Final Clinical Impressions(s) / ED Diagnoses   Final diagnoses:  Polysubstance abuse (HCC)  At risk for medication noncompliance    ED Discharge Orders    None       Arby Barrette, MD 08/28/18 1715

## 2018-08-29 ENCOUNTER — Emergency Department (HOSPITAL_COMMUNITY)
Admission: EM | Admit: 2018-08-29 | Discharge: 2018-08-29 | Disposition: A | Discharge status: Home / Self Care | Payer: Medicaid Other | Attending: Emergency Medicine | Admitting: Emergency Medicine

## 2018-08-29 ENCOUNTER — Encounter (HOSPITAL_COMMUNITY): Payer: Self-pay | Admitting: Emergency Medicine

## 2018-08-29 ENCOUNTER — Emergency Department (HOSPITAL_COMMUNITY): Payer: Medicaid Other

## 2018-08-29 ENCOUNTER — Other Ambulatory Visit: Payer: Self-pay

## 2018-08-29 DIAGNOSIS — F17210 Nicotine dependence, cigarettes, uncomplicated: Secondary | ICD-10-CM | POA: Insufficient documentation

## 2018-08-29 DIAGNOSIS — R350 Frequency of micturition: Secondary | ICD-10-CM | POA: Diagnosis not present

## 2018-08-29 DIAGNOSIS — E1140 Type 2 diabetes mellitus with diabetic neuropathy, unspecified: Secondary | ICD-10-CM | POA: Insufficient documentation

## 2018-08-29 DIAGNOSIS — I5042 Chronic combined systolic (congestive) and diastolic (congestive) heart failure: Secondary | ICD-10-CM | POA: Diagnosis not present

## 2018-08-29 DIAGNOSIS — R079 Chest pain, unspecified: Secondary | ICD-10-CM | POA: Insufficient documentation

## 2018-08-29 DIAGNOSIS — Z794 Long term (current) use of insulin: Secondary | ICD-10-CM | POA: Diagnosis not present

## 2018-08-29 DIAGNOSIS — I110 Hypertensive heart disease with heart failure: Secondary | ICD-10-CM | POA: Diagnosis not present

## 2018-08-29 LAB — BASIC METABOLIC PANEL
Anion gap: 6 (ref 5–15)
BUN: 18 mg/dL (ref 6–20)
CHLORIDE: 105 mmol/L (ref 98–111)
CO2: 28 mmol/L (ref 22–32)
Calcium: 8.9 mg/dL (ref 8.9–10.3)
Creatinine, Ser: 1.11 mg/dL (ref 0.61–1.24)
GFR calc Af Amer: >60 mL/min (ref >60)
GFR calc non Af Amer: >60 mL/min (ref >60)
Glucose, Bld: 237 mg/dL — ABNORMAL HIGH (ref 70–99)
Potassium: 3.7 mmol/L (ref 3.5–5.1)
Sodium: 139 mmol/L (ref 135–145)

## 2018-08-29 LAB — CBC
HCT: 37.2 % — ABNORMAL LOW (ref 39.0–52.0)
Hemoglobin: 10.9 g/dL — ABNORMAL LOW (ref 13.0–17.0)
MCH: 25.3 pg — ABNORMAL LOW (ref 26.0–34.0)
MCHC: 29.3 g/dL — ABNORMAL LOW (ref 30.0–36.0)
MCV: 86.5 fL (ref 80.0–100.0)
Platelets: 298 10*3/uL (ref 150–400)
RBC: 4.3 MIL/uL (ref 4.22–5.81)
RDW: 17.2 % — ABNORMAL HIGH (ref 11.5–15.5)
WBC: 6.4 10*3/uL (ref 4.0–10.5)
nRBC: 0 % (ref 0.0–0.2)

## 2018-08-29 LAB — I-STAT TROPONIN, ED: Troponin i, poc: 0.05 ng/mL (ref 0.00–0.08)

## 2018-08-29 MED ORDER — SODIUM CHLORIDE 0.9% FLUSH: 3.0000 mL | Freq: Once | INTRAVENOUS | Status: DC

## 2018-08-29 MED ORDER — ACETAMINOPHEN 500 MG PO TABS
1000.0000 mg | ORAL_TABLET | Freq: Once | ORAL | Status: AC
  Administered 2018-08-29: 1000 mg via ORAL
  Filled 2018-08-29: qty 2

## 2018-08-29 MED ORDER — LISINOPRIL 10 MG PO TABS
5.0000 mg | ORAL_TABLET | Freq: Every day | ORAL | Status: DC
  Administered 2018-08-29: 5 mg via ORAL
  Filled 2018-08-29: qty 1

## 2018-08-29 MED ORDER — AMLODIPINE BESYLATE 5 MG PO TABS
10.0000 mg | ORAL_TABLET | Freq: Every day | ORAL | Status: DC
  Administered 2018-08-29: 10 mg via ORAL
  Filled 2018-08-29: qty 2

## 2018-08-29 MED ORDER — FUROSEMIDE 20 MG PO TABS
40.0000 mg | ORAL_TABLET | Freq: Two times a day (BID) | ORAL | Status: DC
  Administered 2018-08-29: 40 mg via ORAL
  Filled 2018-08-29: qty 2

## 2018-08-29 MED ORDER — ASPIRIN EC 81 MG PO TBEC
81.0000 mg | DELAYED_RELEASE_TABLET | Freq: Every day | ORAL | Status: DC
  Administered 2018-08-29: 81 mg via ORAL
  Filled 2018-08-29: qty 1
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
## 2018-08-29 NOTE — ED Triage Notes (Signed)
Pt c/o chest pain and urinary frequency related to his prostate.

## 2018-09-01 ENCOUNTER — Other Ambulatory Visit: Payer: Self-pay

## 2018-09-01 ENCOUNTER — Emergency Department (HOSPITAL_COMMUNITY): Payer: Medicare Other

## 2018-09-01 ENCOUNTER — Inpatient Hospital Stay (HOSPITAL_COMMUNITY)
Admission: EM | Admit: 2018-09-01 | Discharge: 2018-09-04 | DRG: 292 | Disposition: A | Discharge status: Home / Self Care | Payer: Medicare Other | Attending: Internal Medicine | Admitting: Internal Medicine

## 2018-09-01 ENCOUNTER — Emergency Department (HOSPITAL_COMMUNITY)
Admission: EM | Admit: 2018-09-01 | Discharge: 2018-09-01 | Disposition: A | Discharge status: Home / Self Care | Payer: Medicaid Other | Attending: Emergency Medicine | Admitting: Emergency Medicine

## 2018-09-01 ENCOUNTER — Encounter (HOSPITAL_COMMUNITY): Payer: Self-pay | Admitting: *Deleted

## 2018-09-01 DIAGNOSIS — E1165 Type 2 diabetes mellitus with hyperglycemia: Secondary | ICD-10-CM | POA: Diagnosis present

## 2018-09-01 DIAGNOSIS — F200 Paranoid schizophrenia: Secondary | ICD-10-CM | POA: Diagnosis present

## 2018-09-01 DIAGNOSIS — Z7982 Long term (current) use of aspirin: Secondary | ICD-10-CM

## 2018-09-01 DIAGNOSIS — I5042 Chronic combined systolic (congestive) and diastolic (congestive) heart failure: Secondary | ICD-10-CM | POA: Diagnosis not present

## 2018-09-01 DIAGNOSIS — I248 Other forms of acute ischemic heart disease: Secondary | ICD-10-CM | POA: Diagnosis present

## 2018-09-01 DIAGNOSIS — G4700 Insomnia, unspecified: Secondary | ICD-10-CM | POA: Diagnosis present

## 2018-09-01 DIAGNOSIS — Z720 Tobacco use: Secondary | ICD-10-CM | POA: Diagnosis present

## 2018-09-01 DIAGNOSIS — E1140 Type 2 diabetes mellitus with diabetic neuropathy, unspecified: Secondary | ICD-10-CM | POA: Insufficient documentation

## 2018-09-01 DIAGNOSIS — E119 Type 2 diabetes mellitus without complications: Secondary | ICD-10-CM

## 2018-09-01 DIAGNOSIS — R45851 Suicidal ideations: Secondary | ICD-10-CM | POA: Diagnosis present

## 2018-09-01 DIAGNOSIS — F17210 Nicotine dependence, cigarettes, uncomplicated: Secondary | ICD-10-CM | POA: Diagnosis not present

## 2018-09-01 DIAGNOSIS — Z5900 Homelessness unspecified: Secondary | ICD-10-CM

## 2018-09-01 DIAGNOSIS — I10 Essential (primary) hypertension: Secondary | ICD-10-CM | POA: Diagnosis present

## 2018-09-01 DIAGNOSIS — K5900 Constipation, unspecified: Secondary | ICD-10-CM | POA: Diagnosis present

## 2018-09-01 DIAGNOSIS — I50811 Acute right heart failure: Secondary | ICD-10-CM | POA: Diagnosis not present

## 2018-09-01 DIAGNOSIS — I110 Hypertensive heart disease with heart failure: Secondary | ICD-10-CM | POA: Diagnosis not present

## 2018-09-01 DIAGNOSIS — E785 Hyperlipidemia, unspecified: Secondary | ICD-10-CM | POA: Diagnosis present

## 2018-09-01 DIAGNOSIS — Z794 Long term (current) use of insulin: Secondary | ICD-10-CM

## 2018-09-01 DIAGNOSIS — I5043 Acute on chronic combined systolic (congestive) and diastolic (congestive) heart failure: Secondary | ICD-10-CM | POA: Diagnosis present

## 2018-09-01 DIAGNOSIS — R609 Edema, unspecified: Secondary | ICD-10-CM

## 2018-09-01 DIAGNOSIS — Z79899 Other long term (current) drug therapy: Secondary | ICD-10-CM

## 2018-09-01 DIAGNOSIS — R0981 Nasal congestion: Secondary | ICD-10-CM | POA: Diagnosis not present

## 2018-09-01 DIAGNOSIS — E1142 Type 2 diabetes mellitus with diabetic polyneuropathy: Secondary | ICD-10-CM | POA: Diagnosis present

## 2018-09-01 DIAGNOSIS — R600 Localized edema: Secondary | ICD-10-CM

## 2018-09-01 DIAGNOSIS — J9601 Acute respiratory failure with hypoxia: Secondary | ICD-10-CM

## 2018-09-01 DIAGNOSIS — G4733 Obstructive sleep apnea (adult) (pediatric): Secondary | ICD-10-CM | POA: Diagnosis present

## 2018-09-01 DIAGNOSIS — R3911 Hesitancy of micturition: Secondary | ICD-10-CM | POA: Diagnosis present

## 2018-09-01 DIAGNOSIS — F1410 Cocaine abuse, uncomplicated: Secondary | ICD-10-CM | POA: Diagnosis present

## 2018-09-01 DIAGNOSIS — I429 Cardiomyopathy, unspecified: Secondary | ICD-10-CM | POA: Diagnosis present

## 2018-09-01 DIAGNOSIS — Z9119 Patient's noncompliance with other medical treatment and regimen: Secondary | ICD-10-CM

## 2018-09-01 DIAGNOSIS — Z833 Family history of diabetes mellitus: Secondary | ICD-10-CM

## 2018-09-01 LAB — COMPREHENSIVE METABOLIC PANEL
ALT: 25 U/L (ref 0–44)
AST: 49 U/L — ABNORMAL HIGH (ref 15–41)
Albumin: 2.8 g/dL — ABNORMAL LOW (ref 3.5–5.0)
Alkaline Phosphatase: 94 U/L (ref 38–126)
Anion gap: 6 (ref 5–15)
BUN: 21 mg/dL — ABNORMAL HIGH (ref 6–20)
CO2: 22 mmol/L (ref 22–32)
Calcium: 8.7 mg/dL — ABNORMAL LOW (ref 8.9–10.3)
Chloride: 105 mmol/L (ref 98–111)
Creatinine, Ser: 0.99 mg/dL (ref 0.61–1.24)
GFR calc Af Amer: >60 mL/min (ref >60)
GFR calc non Af Amer: >60 mL/min (ref >60)
Glucose, Bld: 359 mg/dL — ABNORMAL HIGH (ref 70–99)
Potassium: 4.3 mmol/L (ref 3.5–5.1)
Sodium: 133 mmol/L — ABNORMAL LOW (ref 135–145)
Total Bilirubin: 0.8 mg/dL (ref 0.3–1.2)
Total Protein: 6.3 g/dL — ABNORMAL LOW (ref 6.5–8.1)

## 2018-09-01 LAB — CBC WITH DIFFERENTIAL/PLATELET
Abs Immature Granulocytes: 0.03 10*3/uL (ref 0.00–0.07)
BASOS ABS: 0.1 10*3/uL (ref 0.0–0.1)
Basophils Relative: 1 %
Eosinophils Absolute: 0 10*3/uL (ref 0.0–0.5)
Eosinophils Relative: 0 %
HCT: 36.4 % — ABNORMAL LOW (ref 39.0–52.0)
Hemoglobin: 11 g/dL — ABNORMAL LOW (ref 13.0–17.0)
Immature Granulocytes: 0 %
Lymphocytes Relative: 11 %
Lymphs Abs: 1 10*3/uL (ref 0.7–4.0)
MCH: 26.1 pg (ref 26.0–34.0)
MCHC: 30.2 g/dL (ref 30.0–36.0)
MCV: 86.3 fL (ref 80.0–100.0)
Monocytes Absolute: 0.5 10*3/uL (ref 0.1–1.0)
Monocytes Relative: 5 %
NRBC: 0 % (ref 0.0–0.2)
Neutro Abs: 7.6 10*3/uL (ref 1.7–7.7)
Neutrophils Relative %: 83 %
Platelets: 290 10*3/uL (ref 150–400)
RBC: 4.22 MIL/uL (ref 4.22–5.81)
RDW: 17.2 % — ABNORMAL HIGH (ref 11.5–15.5)
WBC: 9.2 10*3/uL (ref 4.0–10.5)

## 2018-09-01 LAB — BRAIN NATRIURETIC PEPTIDE: B Natriuretic Peptide: 1325.2 pg/mL — ABNORMAL HIGH (ref 0.0–100.0)

## 2018-09-01 LAB — POCT I-STAT EG7
Bicarbonate: 25.2 mmol/L (ref 20.0–28.0)
Calcium, Ion: 1.16 mmol/L (ref 1.15–1.40)
HCT: 33 % — ABNORMAL LOW (ref 39.0–52.0)
Hemoglobin: 11.2 g/dL — ABNORMAL LOW (ref 13.0–17.0)
O2 Saturation: 71 %
Potassium: 5.4 mmol/L — ABNORMAL HIGH (ref 3.5–5.1)
SODIUM: 138 mmol/L (ref 135–145)
TCO2: 26 mmol/L (ref 22–32)
pCO2, Ven: 40 mmHg — ABNORMAL LOW (ref 44.0–60.0)
pH, Ven: 7.406 (ref 7.250–7.430)
pO2, Ven: 37 mmHg (ref 32.0–45.0)

## 2018-09-01 LAB — I-STAT TROPONIN, ED: TROPONIN I, POC: 0.04 ng/mL (ref 0.00–0.08)

## 2018-09-01 LAB — CBG MONITORING, ED: Glucose-Capillary: 353 mg/dL — ABNORMAL HIGH (ref 70–99)

## 2018-09-01 MED ORDER — FUROSEMIDE 10 MG/ML IJ SOLN
40.0000 mg | Freq: Once | INTRAMUSCULAR | Status: AC
  Administered 2018-09-02: 40 mg via INTRAVENOUS
  Filled 2018-09-01: qty 4

## 2018-09-01 MED ORDER — OXYMETAZOLINE HCL 0.05 % NA SOLN
1.0000 | Freq: Once | NASAL | Status: AC
  Administered 2018-09-01: 1 via NASAL
  Filled 2018-09-01: qty 30

## 2018-09-01 MED ORDER — ACETAMINOPHEN 500 MG PO TABS
1000.0000 mg | ORAL_TABLET | Freq: Once | ORAL | Status: AC
  Administered 2018-09-01: 1000 mg via ORAL
  Filled 2018-09-01: qty 2

## 2018-09-01 NOTE — ED Triage Notes (Signed)
To ED asking for 'tylenol for my nose running'. Pt denies further complaints

## 2018-09-01 NOTE — Progress Notes (Signed)
CSW received verbal permission from the pt via the pt's RN's phone to call pt's cousin.  CSW attempted to reach patients cousin, Williemae Natter [phone] but phone's VM was not set up.  Pt reviewed chart and sees pt has a payee at ph: Tammy [phone].  CSW spoke care coordinator with Donnella Bi [phone] and updated her.  CSW was asked by the pt's Care Coordinator to leave a handoff for the CSW on the medical floor.  Per pt's Care Coordinator pt has a home, but refuses to use it, had an ACTT team, but CC is unsure if they are still involved.  CSW stated CSW will leave a handoff for the CSW's on the medical floor to follow up with the provider to assess the appropriateness of psychiatry seeing the pt in order for the pt to receive his monthly IM medications that assist the pt with remaining somewhat non-acute, per the pt's Care Coordinator.  If pt is not admitted pt can go by bus or taxi to the [employer] at [employer].  RN updated.  Please reconsult if future social work needs arise.  CSW signing off, as social work intervention is no longer needed.  Dorothe Pea. Elcie Pelster, LCSW, LCAS, CSI Clinical Social Worker Ph: [phone]

## 2018-09-01 NOTE — ED Provider Notes (Signed)
[name] Provider Note   CSN: [id number] Arrival date & time: 09/01/18  0259    History   Chief Complaint Chief Complaint  Patient presents with  . Nasal Congestion    HPI SCIPIO PRESSNALL is a 58 y.o. male.     The history is provided by the patient.  URI  Presenting symptoms: congestion   Presenting symptoms: no cough, no ear pain, no facial pain, no fatigue, no fever and no sore throat   Severity:  Mild Onset quality:  Gradual Timing:  Constant Progression:  Unchanged Chronicity:  New Relieved by:  Nothing Worsened by:  Nothing Ineffective treatments:  None tried Associated symptoms: no arthralgias   Risk factors: not elderly and no sick contacts   Would like footie socks and milk and crackers.    Past Medical History:  Diagnosis Date  . CHF (congestive heart failure) (HCC)   . Chronic foot pain   . Cocaine abuse (HCC)   . Diabetes mellitus without complication (HCC)   . Hepatitis C    unsure   . Homelessness   . Hypertension   . Neuropathy   . Polysubstance abuse (HCC)   . Schizophrenia (HCC)   . Sleep apnea     Patient Active Problem List   Diagnosis Date Noted  . Acute on chronic combined systolic and diastolic CHF (congestive heart failure) (HCC) 08/25/2018  . Acute respiratory failure with hypoxia (HCC) 08/25/2018  . Acute on chronic combined systolic and diastolic congestive heart failure (HCC) 08/18/2018  . Tobacco use 08/18/2018  . 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 Homelessness 08/08/2018  . Smoker 08/08/2018  . CHF (congestive heart failure) (HCC) 07/15/2018  . Acute exacerbation of CHF (congestive heart failure) (HCC) 03/28/2018  . Prostate enlargement 03/16/2018  . Aortic atherosclerosis (HCC) 03/16/2018  . 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 Chronic foot pain   . Schizoaffective disorder, bipolar type  (HCC) 09/30/2016  . Substance induced mood disorder (HCC) 03/13/2015  . Acute kidney failure (HCC) 01/26/2015  . Schizophrenia, paranoid type (HCC) 01/17/2015  . Suicidal ideation   . Drug hallucinosis (HCC) 10/08/2014  . Chronic paranoid schizophrenia (HCC) 09/07/2014  . 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 Urinary retention   . Cocaine use disorder, severe, dependence (HCC)   . Essential hypertension, benign 03/28/2013  . 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 MULTIPLE TOOTH EXTRACTIONS          Home Medications    Prior to Admission medications   Medication Sig Start Date End Date Taking? Authorizing Provider  amLODipine (NORVASC) 10 MG tablet Take 1 tablet (10 mg total) by mouth daily. Patient not taking: Reported on 08/25/2018 08/03/18   Gilda Crease, MD  aspirin EC 81 MG tablet Take 1 tablet (81 mg total) by mouth daily. Patient not taking: Reported on 08/18/2018 08/09/18 08/09/19  Berton Mount I, MD  atorvastatin (LIPITOR) 40 MG tablet Take 1 tablet (40 mg total) by mouth daily. Patient not taking: Reported on 08/25/2018 08/03/18   Gilda Crease, MD  bisacodyl (DULCOLAX) 5 MG EC tablet Take 1 tablet (5 mg total) by mouth daily as needed for moderate constipation. Patient not taking: Reported on 08/28/2018 08/26/18   Joseph Art, DO  furosemide (LASIX) 40 MG tablet  Take 1 tablet (40 mg total) by mouth 2 (two) times daily. Patient not taking: Reported on 08/28/2018 08/26/18   Joseph Art, DO  Insulin Glargine (LANTUS) 100 UNIT/ML Solostar Pen Inject 10 Units into the skin daily. Patient not taking: Reported on 08/28/2018 08/26/18   Joseph Art, DO  Insulin Pen Needle 29G X MISC Use with insulin 08/03/18   Gilda Crease, MD  lisinopril (PRINIVIL,ZESTRIL) 5 MG tablet Take 1 tablet  (5 mg total) by mouth daily. Patient not taking: Reported on 08/25/2018 08/03/18   Gilda Crease, MD  nicotine (NICODERM CQ - DOSED IN MG/24 HOURS) 21 mg/24hr patch Place 1 patch (21 mg total) onto the skin daily. Patient not taking: Reported on 08/28/2018 08/26/18   Joseph Art, DO  potassium chloride 20 MEQ TBCR Take 20 mEq by mouth daily. Patient not taking: Reported on 08/28/2018 08/26/18   Joseph Art, DO    Family History Family History  Problem Relation Age of Onset  . Hypertension Other   . Diabetes Other     Social History Social History   Tobacco Use  . Smoking status: Current Every Day Smoker    Packs/day: 1.00    Years: 20.00    Pack years: 20.00    Types: Cigarettes  . Smokeless tobacco: Current User  Substance Use Topics  . Alcohol use: Yes    Comment: Daily Drinker   . Drug use: Yes    Frequency: 7.0 times per week    Types: "Crack" cocaine, Cocaine, Marijuana    Comment: Cocaine tonight, Marijuana "a long time"     Allergies   Haldol [haloperidol]   Review of Systems Review of Systems  Constitutional: Negative for fatigue and fever.  HENT: Positive for congestion. Negative for ear pain and sore throat.   Eyes: Negative for photophobia.  Respiratory: Negative for cough and shortness of breath.   Cardiovascular: Negative for chest pain.  Musculoskeletal: Negative for arthralgias.  All other systems reviewed and are negative.    Physical Exam Updated Vital Signs BP (!) 161/104   Pulse 99   Temp 97.9 F (36.6 C) (Oral)   Resp 16   SpO2 100%   Physical Exam Vitals signs and nursing note reviewed.  Constitutional:      Appearance: Normal appearance. He is normal weight.  HENT:     Head: Normocephalic and atraumatic.     Nose: Nose normal.     Mouth/Throat:     Mouth: Mucous membranes are moist.     Pharynx: Oropharynx is clear.  Eyes:     Conjunctiva/sclera: Conjunctivae normal.     Pupils: Pupils are equal, round, and  reactive to light.  Neck:     Musculoskeletal: Normal range of motion and neck supple.  Cardiovascular:     Rate and Rhythm: Normal rate and regular rhythm.     Pulses: Normal pulses.     Heart sounds: Normal heart sounds.  Pulmonary:     Effort: Pulmonary effort is normal.     Breath sounds: Normal breath sounds. No wheezing or rales.  Abdominal:     General: Abdomen is flat.     Tenderness: There is no abdominal tenderness. There is no guarding.  Musculoskeletal: Normal range of motion.     Right lower leg: No edema.     Left lower leg: No edema.  Skin:    General: Skin is warm and dry.     Capillary Refill: Capillary refill takes  less than 2 seconds.  Neurological:     General: No focal deficit present.     Mental Status: He is alert and oriented to person, place, and time.  Psychiatric:        Mood and Affect: Mood normal.        Behavior: Behavior normal.      ED Treatments / Results  Labs (all labs ordered are listed, but only abnormal results are displayed) Labs Reviewed - No data to display  EKG None  Radiology No results found.  Procedures Procedures (including critical care time)  Medications Ordered in ED Medications  oxymetazoline (AFRIN) 0.05 % nasal spray 1 spray (has no administration in time range)  acetaminophen (TYLENOL) tablet 1,000 mg (has no administration in time range)     Po challenged successfully in the ED  Final Clinical Impressions(s) / ED Diagnoses   Final diagnoses:  Nasal congestion  Homelessness   Return for pain, intractable cough, fevers >100.4 unrelieved by medication, shortness of breath, intractable vomiting, chest pain, shortness of breath, weakness numbness, changes in speech, facial asymmetry,abdominal pain, passing out,Inability to tolerate liquids or food, cough, altered mental status or any concerns. No signs of systemic illness or infection. The patient is nontoxic-appearing on exam and vital signs are within  normal limits.   I have reviewed the triage vital signs and the nursing notes. Pertinent labs &imaging results that were available during my care of the patient were reviewed by me and considered in my medical decision making (see chart for details).  After history, exam, and medical workup I feel the patient has been appropriately medically screened and is safe for discharge home. Pertinent diagnoses were discussed with the patient. Patient was given return precautions.   Florice Hindle, MD 09/01/18 7829

## 2018-09-01 NOTE — ED Triage Notes (Signed)
Pt coming by EMS after calling with complaints of bilateral leg swelling and shob. States used cocaine today. CBG 548. Pt placed on 2 L [place] by EMS for comfort

## 2018-09-01 NOTE — ED Notes (Signed)
Venous Blood Gas PO2 Results of 37 reported to Dr. Lockie Mola.

## 2018-09-01 NOTE — ED Provider Notes (Signed)
[facility] EMERGENCY DEPARTMENT Provider Note   CSN: [id number] Arrival date & time: 09/01/18  2001    History   Chief Complaint Chief Complaint  Patient presents with  . Leg Swelling  . Hyperglycemia    HPI [name] is a 58 y.o. male.     The history is provided by the patient.  Shortness of Breath  Severity:  Moderate Onset quality:  Gradual Timing:  Constant Progression:  Worsening Chronicity:  Recurrent Context: not activity   Relieved by:  Nothing Worsened by:  Exertion Associated symptoms: no abdominal pain, no chest pain, no cough, no ear pain, no fever, no rash, no sore throat and no vomiting   Risk factors: no hx of PE/DVT   Risk factors comment:  CHF, DM   Past Medical History:  Diagnosis Date  . CHF (congestive heart failure) (HCC)   . Chronic foot pain   . Cocaine abuse (HCC)   . Diabetes mellitus without complication (HCC)   . Hepatitis C    unsure   . Homelessness   . Hypertension   . Neuropathy   . Polysubstance abuse (HCC)   . Schizophrenia (HCC)   . Sleep apnea     Patient Active Problem List   Diagnosis Date Noted  . Acute on chronic combined systolic and diastolic CHF (congestive heart failure) (HCC) 08/25/2018  . Acute respiratory failure with hypoxia (HCC) 08/25/2018  . Acute on chronic combined systolic and diastolic congestive heart failure (HCC) 08/18/2018  . Tobacco use 08/18/2018  . 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 Homelessness 08/08/2018  . Smoker 08/08/2018  . CHF (congestive heart failure) (HCC) 07/15/2018  . Acute exacerbation of CHF (congestive heart failure) (HCC) 03/28/2018  . Prostate enlargement 03/16/2018  . Aortic atherosclerosis (HCC) 03/16/2018  . 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 Chronic foot pain   . Schizoaffective disorder, bipolar type (HCC) 09/30/2016  . Substance induced mood disorder  (HCC) 03/13/2015  . Acute kidney failure (HCC) 01/26/2015  . Schizophrenia, paranoid type (HCC) 01/17/2015  . Suicidal ideation   . Drug hallucinosis (HCC) 10/08/2014  . Chronic paranoid schizophrenia (HCC) 09/07/2014  . 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 Urinary retention   . Cocaine use disorder, severe, dependence (HCC)   . Essential hypertension, benign 03/28/2013  . 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 MULTIPLE TOOTH EXTRACTIONS          Home Medications    Prior to Admission medications   Medication Sig Start Date End Date Taking? Authorizing Provider  amLODipine (NORVASC) 10 MG tablet Take 1 tablet (10 mg total) by mouth daily. Patient not taking: Reported on 08/25/2018 08/03/18   Gilda Crease, MD  aspirin EC 81 MG tablet Take 1 tablet (81 mg total) by mouth daily. Patient not taking: Reported on 08/18/2018 08/09/18 08/09/19  Berton Mount I, MD  atorvastatin (LIPITOR) 40 MG tablet Take 1 tablet (40 mg total) by mouth daily. Patient not taking: Reported on 08/25/2018 08/03/18   Gilda Crease, MD  bisacodyl (DULCOLAX) 5 MG EC tablet Take 1 tablet (5 mg total) by mouth daily as needed for moderate constipation. Patient not taking: Reported on 08/28/2018 08/26/18   Joseph Art, DO  furosemide (LASIX) 40 MG tablet Take 1 tablet (40 mg total)  by mouth 2 (two) times daily. Patient not taking: Reported on 08/28/2018 08/26/18   Joseph Art, DO  Insulin Glargine (LANTUS) 100 UNIT/ML Solostar Pen Inject 10 Units into the skin daily. Patient not taking: Reported on 08/28/2018 08/26/18   Joseph Art, DO  Insulin Pen Needle 29G X MISC Use with insulin 08/03/18   Gilda Crease, MD  lisinopril (PRINIVIL,ZESTRIL) 5 MG tablet Take 1 tablet (5 mg total) by mouth daily. Patient not taking:  Reported on 08/25/2018 08/03/18   Gilda Crease, MD  nicotine (NICODERM CQ - DOSED IN MG/24 HOURS) 21 mg/24hr patch Place 1 patch (21 mg total) onto the skin daily. Patient not taking: Reported on 08/28/2018 08/26/18   Joseph Art, DO  potassium chloride 20 MEQ TBCR Take 20 mEq by mouth daily. Patient not taking: Reported on 08/28/2018 08/26/18   Joseph Art, DO    Family History Family History  Problem Relation Age of Onset  . Hypertension Other   . Diabetes Other     Social History Social History   Tobacco Use  . Smoking status: Current Every Day Smoker    Packs/day: 1.00    Years: 20.00    Pack years: 20.00    Types: Cigarettes  . Smokeless tobacco: Current User  Substance Use Topics  . Alcohol use: Yes    Comment: Daily Drinker   . Drug use: Yes    Frequency: 7.0 times per week    Types: "Crack" cocaine, Cocaine, Marijuana    Comment: Cocaine tonight, Marijuana "a long time"     Allergies   Haldol [haloperidol]   Review of Systems Review of Systems  Constitutional: Negative for chills and fever.  HENT: Negative for ear pain and sore throat.   Eyes: Negative for pain and visual disturbance.  Respiratory: Positive for shortness of breath. Negative for cough.   Cardiovascular: Positive for leg swelling. Negative for chest pain and palpitations.  Gastrointestinal: Negative for abdominal pain and vomiting.  Genitourinary: Positive for scrotal swelling (chronic). Negative for dysuria and hematuria.  Musculoskeletal: Negative for arthralgias and back pain.  Skin: Negative for color change and rash.  Neurological: Negative for seizures and syncope.  All other systems reviewed and are negative.    Physical Exam Updated Vital Signs  ED Triage Vitals  Enc Vitals Group     BP 09/01/18 1959 (!) 161/95     Pulse Rate 09/01/18 1959 96     Resp 09/01/18 1959 (!) 29     Temp 09/01/18 1959 98.3 F (36.8 C)     Temp Source 09/01/18 1959 Oral     SpO2  09/01/18 1959 97 %     Weight --      Height 09/01/18 2000  (1.753 m)     Head Circumference --      Peak Flow --      Pain Score 09/01/18 2100 Asleep     Pain Loc --      Pain Edu? --      Excl. in GC? --     Physical Exam Vitals signs and nursing note reviewed.  Constitutional:      General: He is not in acute distress.    Appearance: He is well-developed. He is not ill-appearing.  HENT:     Head: Normocephalic and atraumatic.     Nose: Nose normal.     Mouth/Throat:     Mouth: Mucous membranes are moist.  Eyes:  Extraocular Movements: Extraocular movements intact.     Conjunctiva/sclera: Conjunctivae normal.     Pupils: Pupils are equal, round, and reactive to light.  Neck:     Musculoskeletal: Normal range of motion and neck supple.  Cardiovascular:     Rate and Rhythm: Normal rate and regular rhythm.     Pulses: Normal pulses.     Heart sounds: Normal heart sounds. No murmur.  Pulmonary:     Effort: No respiratory distress.     Breath sounds: Rales present.  Abdominal:     General: There is no distension.     Palpations: Abdomen is soft.     Tenderness: There is no abdominal tenderness.  Musculoskeletal:     Right lower leg: Edema (3+) present.     Left lower leg: Edema (3+) present.  Skin:    General: Skin is warm and dry.     Capillary Refill: Capillary refill takes less than 2 seconds.  Neurological:     General: No focal deficit present.     Mental Status: He is alert.  Psychiatric:        Mood and Affect: Mood normal.      ED Treatments / Results  Labs (all labs ordered are listed, but only abnormal results are displayed) Labs Reviewed  COMPREHENSIVE METABOLIC PANEL - Abnormal; Notable for the following components:      Result Value   Sodium 133 (*)    Glucose, Bld 359 (*)    BUN 21 (*)    Calcium 8.7 (*)    Total Protein 6.3 (*)    Albumin 2.8 (*)    AST 49 (*)    All other components within normal limits  CBC WITH  DIFFERENTIAL/PLATELET - Abnormal; Notable for the following components:   Hemoglobin 11.0 (*)    HCT 36.4 (*)    RDW 17.2 (*)    All other components within normal limits  BRAIN NATRIURETIC PEPTIDE - Abnormal; Notable for the following components:   B Natriuretic Peptide 1,325.2 (*)    All other components within normal limits  CBG MONITORING, ED - Abnormal; Notable for the following components:   Glucose-Capillary 353 (*)    All other components within normal limits  POCT I-STAT EG7 - Abnormal; Notable for the following components:   pCO2, Ven 40.0 (*)    Potassium 5.4 (*)    HCT 33.0 (*)    Hemoglobin 11.2 (*)    All other components within normal limits  URINALYSIS, ROUTINE W REFLEX MICROSCOPIC  BLOOD GAS, VENOUS  RAPID URINE DRUG SCREEN, HOSP PERFORMED  ETHANOL  I-STAT TROPONIN, ED    EKG  EKG shows sinus rhythm.  No new ischemic changes.  Unchanged from prior.  Intervals within normal limits.  Radiology Dg Chest 2 View  Result Date: 09/01/2018 CLINICAL DATA:  Shortness of breath EXAM: CHEST - 2 VIEW COMPARISON:  08/29/2018, 08/24/2018 FINDINGS: Cardiomegaly with vascular congestion and mild diffuse interstitial and ground-glass opacity. Trace pleural effusions. No pneumothorax. Subsegmental atelectasis or scar in the left mid lung. IMPRESSION: 1. Cardiomegaly with vascular congestion and mild diffuse interstitial and ground-glass opacity suspect for minimal edema. Trace pleural effusions. Electronically Signed   By: [name]   On: 09/01/2018 23:15    Procedures .Critical Care Performed by: Virgina Norfolk, DO Authorized by: Virgina Norfolk, DO   Critical care provider statement:    Critical care time (minutes):  35   Critical care time was exclusive of:  Separately billable procedures  and treating other patients and teaching time   Critical care was necessary to treat or prevent imminent or life-threatening deterioration of the following conditions:  Respiratory  failure   Critical care was time spent personally by me on the following activities:  Blood draw for specimens, development of treatment plan with patient or surrogate, discussions with primary provider, evaluation of patient's response to treatment, examination of patient, obtaining history from patient or surrogate, ordering and review of laboratory studies, pulse oximetry, re-evaluation of patient's condition, review of old charts, ordering and review of radiographic studies and ordering and performing treatments and interventions   I assumed direction of critical care for this patient from another provider in my specialty: no     (including critical care time)  Medications Ordered in ED Medications  furosemide (LASIX) injection 40 mg (has no administration in time range)     Initial Impression / Assessment and Plan / ED Course  I have reviewed the triage vital signs and the nursing notes.  Pertinent labs & imaging results that were available during my care of the patient were reviewed by me and considered in my medical decision making (see chart for details).        [name] is a 58 year old male with history of heart failure, diabetes, polysubstance abuse, schizophrenia who presents to the ED with shortness of breath, leg swelling, elevated blood sugar.  Patient mildly hypoxic that improved on on 2 L of oxygen.  Good work of breathing.  Hypertensive.  Patient had elevated blood sugar EMS of greater than 500.  Patient with peripheral edema with 3+ pitting edema bilaterally.  Rales on exam.  Suspect heart failure exacerbation.  Patient is homeless and does not take his medications.  Denies any recent drug use.  Denies any chest pain.  Has chronic swelling of his scrotum.  No abdominal tenderness.  Lab work and imaging of his chest are consistent with volume overload likely from heart failure.  BNP elevated at 1200.  Chest x-ray shows signs of volume overload.  Troponin within  normal limits.  EKG shows sinus rhythm.  No new ischemic changes. Doubt ACS. No other significant electrolyte abnormality, kidney injury, leukocytosis.  Blood gas unremarkable.  Patient overall with acute hypoxic respiratory failure secondary to heart failure exacerbation.  Was given 40 mg IV Lasix admitted to medicine for further care.  Social work was engaged as well to help patient with resources.  This chart was dictated using voice recognition software.  Despite best efforts to proofread,  errors can occur which can change the documentation meaning.    Final Clinical Impressions(s) / ED Diagnoses   Final diagnoses:  Acute respiratory failure with hypoxia (HCC)  Peripheral edema  Acute right-sided heart failure [facility]    ED Discharge Orders    None       Virgina Norfolk, DO 09/02/18 0021

## 2018-09-01 NOTE — ED Notes (Signed)
Pt starting to desat to 88% on RA while asleep. Placed on 1 L [place]. Pt sating at 96% now

## 2018-09-02 ENCOUNTER — Encounter (HOSPITAL_COMMUNITY): Payer: Self-pay

## 2018-09-02 ENCOUNTER — Observation Stay (HOSPITAL_COMMUNITY): Payer: Medicare Other

## 2018-09-02 DIAGNOSIS — I10 Essential (primary) hypertension: Secondary | ICD-10-CM

## 2018-09-02 DIAGNOSIS — G4733 Obstructive sleep apnea (adult) (pediatric): Secondary | ICD-10-CM | POA: Diagnosis present

## 2018-09-02 DIAGNOSIS — I50811 Acute right heart failure: Secondary | ICD-10-CM | POA: Diagnosis present

## 2018-09-02 DIAGNOSIS — F1410 Cocaine abuse, uncomplicated: Secondary | ICD-10-CM | POA: Diagnosis present

## 2018-09-02 DIAGNOSIS — Z7982 Long term (current) use of aspirin: Secondary | ICD-10-CM | POA: Diagnosis not present

## 2018-09-02 DIAGNOSIS — K5900 Constipation, unspecified: Secondary | ICD-10-CM | POA: Diagnosis present

## 2018-09-02 DIAGNOSIS — I351 Nonrheumatic aortic (valve) insufficiency: Secondary | ICD-10-CM

## 2018-09-02 DIAGNOSIS — I5043 Acute on chronic combined systolic (congestive) and diastolic (congestive) heart failure: Secondary | ICD-10-CM | POA: Diagnosis not present

## 2018-09-02 DIAGNOSIS — I361 Nonrheumatic tricuspid (valve) insufficiency: Secondary | ICD-10-CM | POA: Diagnosis not present

## 2018-09-02 DIAGNOSIS — I248 Other forms of acute ischemic heart disease: Secondary | ICD-10-CM | POA: Diagnosis present

## 2018-09-02 DIAGNOSIS — Z79899 Other long term (current) drug therapy: Secondary | ICD-10-CM | POA: Diagnosis not present

## 2018-09-02 DIAGNOSIS — E119 Type 2 diabetes mellitus without complications: Secondary | ICD-10-CM

## 2018-09-02 DIAGNOSIS — F17210 Nicotine dependence, cigarettes, uncomplicated: Secondary | ICD-10-CM | POA: Diagnosis present

## 2018-09-02 DIAGNOSIS — F200 Paranoid schizophrenia: Secondary | ICD-10-CM

## 2018-09-02 DIAGNOSIS — Z590 Homelessness: Secondary | ICD-10-CM

## 2018-09-02 DIAGNOSIS — I110 Hypertensive heart disease with heart failure: Secondary | ICD-10-CM | POA: Diagnosis present

## 2018-09-02 DIAGNOSIS — E1165 Type 2 diabetes mellitus with hyperglycemia: Secondary | ICD-10-CM | POA: Diagnosis present

## 2018-09-02 DIAGNOSIS — E785 Hyperlipidemia, unspecified: Secondary | ICD-10-CM | POA: Diagnosis present

## 2018-09-02 DIAGNOSIS — Z833 Family history of diabetes mellitus: Secondary | ICD-10-CM | POA: Diagnosis not present

## 2018-09-02 DIAGNOSIS — R3911 Hesitancy of micturition: Secondary | ICD-10-CM

## 2018-09-02 DIAGNOSIS — R45851 Suicidal ideations: Secondary | ICD-10-CM

## 2018-09-02 DIAGNOSIS — Z9119 Patient's noncompliance with other medical treatment and regimen: Secondary | ICD-10-CM | POA: Diagnosis not present

## 2018-09-02 DIAGNOSIS — G4700 Insomnia, unspecified: Secondary | ICD-10-CM | POA: Diagnosis present

## 2018-09-02 DIAGNOSIS — Z794 Long term (current) use of insulin: Secondary | ICD-10-CM | POA: Diagnosis not present

## 2018-09-02 DIAGNOSIS — E1142 Type 2 diabetes mellitus with diabetic polyneuropathy: Secondary | ICD-10-CM | POA: Diagnosis present

## 2018-09-02 DIAGNOSIS — I429 Cardiomyopathy, unspecified: Secondary | ICD-10-CM | POA: Diagnosis present

## 2018-09-02 DIAGNOSIS — Z720 Tobacco use: Secondary | ICD-10-CM

## 2018-09-02 LAB — CBC
HCT: 35.3 % — ABNORMAL LOW (ref 39.0–52.0)
Hemoglobin: 10.9 g/dL — ABNORMAL LOW (ref 13.0–17.0)
MCH: 25.8 pg — ABNORMAL LOW (ref 26.0–34.0)
MCHC: 30.9 g/dL (ref 30.0–36.0)
MCV: 83.5 fL (ref 80.0–100.0)
NRBC: 0 % (ref 0.0–0.2)
Platelets: 278 10*3/uL (ref 150–400)
RBC: 4.23 MIL/uL (ref 4.22–5.81)
RDW: 17.2 % — AB (ref 11.5–15.5)
WBC: 8.9 10*3/uL (ref 4.0–10.5)

## 2018-09-02 LAB — URINALYSIS, ROUTINE W REFLEX MICROSCOPIC
BACTERIA UA: NONE SEEN
Bilirubin Urine: NEGATIVE
Glucose, UA: >=500 mg/dL — AB
Hgb urine dipstick: NEGATIVE
Ketones, ur: NEGATIVE mg/dL
Leukocytes,Ua: NEGATIVE
Nitrite: NEGATIVE
Protein, ur: 30 mg/dL — AB
Specific Gravity, Urine: 1.02 (ref 1.005–1.030)
pH: 5 (ref 5.0–8.0)

## 2018-09-02 LAB — RAPID URINE DRUG SCREEN, HOSP PERFORMED
Amphetamines: NOT DETECTED
Barbiturates: NOT DETECTED
Benzodiazepines: NOT DETECTED
COCAINE: POSITIVE — AB
Opiates: NOT DETECTED
Tetrahydrocannabinol: NOT DETECTED

## 2018-09-02 LAB — TROPONIN I
Troponin I: 0.04 ng/mL (ref <0.03)
Troponin I: 0.03 ng/mL (ref <0.03)
Troponin I: 0.03 ng/mL (ref <0.03)

## 2018-09-02 LAB — GLUCOSE, CAPILLARY
GLUCOSE-CAPILLARY: 139 mg/dL — AB (ref 70–99)
GLUCOSE-CAPILLARY: 233 mg/dL — AB (ref 70–99)
Glucose-Capillary: 287 mg/dL — ABNORMAL HIGH (ref 70–99)
Glucose-Capillary: 282 mg/dL — ABNORMAL HIGH (ref 70–99)

## 2018-09-02 LAB — ECHOCARDIOGRAM COMPLETE
Height: 69 in
Weight: 3181.68 oz

## 2018-09-02 LAB — ETHANOL: Alcohol, Ethyl (B): <10 mg/dL (ref <10)

## 2018-09-02 LAB — MRSA PCR SCREENING: MRSA by PCR: NEGATIVE

## 2018-09-02 MED ORDER — POTASSIUM CHLORIDE CRYS ER 20 MEQ PO TBCR
20.0000 meq | EXTENDED_RELEASE_TABLET | Freq: Every day | ORAL | Status: DC
  Administered 2018-09-02 – 2018-09-04 (×3): 20 meq via ORAL
  Filled 2018-09-02 (×3): qty 1

## 2018-09-02 MED ORDER — POLYETHYLENE GLYCOL 3350 17 G PO PACK
17.0000 g | PACK | Freq: Every day | ORAL | Status: DC
  Administered 2018-09-02: 17 g via ORAL
  Filled 2018-09-02 (×2): qty 1

## 2018-09-02 MED ORDER — ACETAMINOPHEN 650 MG RE SUPP: 650.0000 mg | Freq: Four times a day (QID) | RECTAL | Status: DC | PRN

## 2018-09-02 MED ORDER — INSULIN GLARGINE 100 UNIT/ML ~~LOC~~ SOLN
10.0000 [IU] | Freq: Every day | SUBCUTANEOUS | Status: DC
  Administered 2018-09-02 – 2018-09-03 (×2): 10 [IU] via SUBCUTANEOUS
  Filled 2018-09-02 (×2): qty 0.1

## 2018-09-02 MED ORDER — INSULIN GLARGINE 100 UNIT/ML SOLOSTAR PEN: 10.0000 [IU] | PEN_INJECTOR | Freq: Every day | SUBCUTANEOUS | Status: DC

## 2018-09-02 MED ORDER — ENOXAPARIN SODIUM 40 MG/0.4ML ~~LOC~~ SOLN
40.0000 mg | Freq: Every day | SUBCUTANEOUS | Status: DC
  Administered 2018-09-03 – 2018-09-04 (×2): 40 mg via SUBCUTANEOUS
  Filled 2018-09-02 (×2): qty 0.4

## 2018-09-02 MED ORDER — TAMSULOSIN HCL 0.4 MG PO CAPS
0.4000 mg | ORAL_CAPSULE | Freq: Every day | ORAL | Status: DC
  Administered 2018-09-02 – 2018-09-04 (×3): 0.4 mg via ORAL
  Filled 2018-09-02 (×3): qty 1

## 2018-09-02 MED ORDER — ATORVASTATIN CALCIUM 40 MG PO TABS
40.0000 mg | ORAL_TABLET | Freq: Every day | ORAL | Status: DC
  Administered 2018-09-02 – 2018-09-04 (×3): 40 mg via ORAL
  Filled 2018-09-02 (×3): qty 1

## 2018-09-02 MED ORDER — ACETAMINOPHEN 325 MG PO TABS
650.0000 mg | ORAL_TABLET | Freq: Four times a day (QID) | ORAL | Status: DC | PRN
  Administered 2018-09-02 – 2018-09-04 (×6): 650 mg via ORAL
  Filled 2018-09-02 (×7): qty 2

## 2018-09-02 MED ORDER — ASPIRIN EC 81 MG PO TBEC
81.0000 mg | DELAYED_RELEASE_TABLET | Freq: Every day | ORAL | Status: DC
  Administered 2018-09-02 – 2018-09-04 (×3): 81 mg via ORAL
  Filled 2018-09-02 (×3): qty 1

## 2018-09-02 MED ORDER — ARIPIPRAZOLE 10 MG PO TABS
10.0000 mg | ORAL_TABLET | Freq: Every day | ORAL | Status: DC
  Administered 2018-09-02 – 2018-09-04 (×3): 10 mg via ORAL
  Filled 2018-09-02 (×3): qty 1

## 2018-09-02 MED ORDER — FUROSEMIDE 10 MG/ML IJ SOLN
40.0000 mg | Freq: Two times a day (BID) | INTRAMUSCULAR | Status: DC
  Administered 2018-09-02 – 2018-09-03 (×4): 40 mg via INTRAVENOUS
  Filled 2018-09-02 (×4): qty 4

## 2018-09-02 MED ORDER — NICOTINE 21 MG/24HR TD PT24
21.0000 mg | MEDICATED_PATCH | Freq: Every day | TRANSDERMAL | Status: DC
  Administered 2018-09-02 – 2018-09-03 (×2): 21 mg via TRANSDERMAL
  Filled 2018-09-02 (×3): qty 1

## 2018-09-02 MED ORDER — LISINOPRIL 5 MG PO TABS
5.0000 mg | ORAL_TABLET | Freq: Every day | ORAL | Status: DC
  Administered 2018-09-02 – 2018-09-03 (×2): 5 mg via ORAL
  Filled 2018-09-02 (×2): qty 1

## 2018-09-02 MED ORDER — SENNOSIDES-DOCUSATE SODIUM 8.6-50 MG PO TABS
1.0000 | ORAL_TABLET | Freq: Two times a day (BID) | ORAL | Status: DC
  Administered 2018-09-02 – 2018-09-04 (×4): 1 via ORAL
  Filled 2018-09-02 (×4): qty 1

## 2018-09-02 MED ORDER — INSULIN ASPART 100 UNIT/ML ~~LOC~~ SOLN
0.0000 [IU] | Freq: Three times a day (TID) | SUBCUTANEOUS | Status: DC
  Administered 2018-09-02: 5 [IU] via SUBCUTANEOUS
  Administered 2018-09-02: 3 [IU] via SUBCUTANEOUS
  Administered 2018-09-03: 2 [IU] via SUBCUTANEOUS
  Administered 2018-09-03: 3 [IU] via SUBCUTANEOUS
  Administered 2018-09-04: 5 [IU] via SUBCUTANEOUS

## 2018-09-02 MED ORDER — ORAL CARE MOUTH RINSE: 15.0000 mL | Freq: Two times a day (BID) | OROMUCOSAL | Status: DC

## 2018-09-02 MED ORDER — CARBAMAZEPINE 200 MG PO TABS
100.0000 mg | ORAL_TABLET | Freq: Two times a day (BID) | ORAL | Status: DC
  Administered 2018-09-03 – 2018-09-04 (×3): 100 mg via ORAL
  Filled 2018-09-02 (×4): qty 0.5

## 2018-09-02 MED ORDER — BISACODYL 5 MG PO TBEC: 5.0000 mg | DELAYED_RELEASE_TABLET | Freq: Every day | ORAL | Status: DC | PRN

## 2018-09-02 NOTE — Progress Notes (Signed)
  Echocardiogram 2D Echocardiogram has been performed.  [name] 09/02/2018, 3:40 PM

## 2018-09-02 NOTE — ED Notes (Signed)
ED TO INPATIENT HANDOFF REPORT  ED Nurse Name and Phone #: [phone] Donetta Potts Name/Age/Gender [name] 58 y.o. male Room/Bed: H019C/H019C  Code Status   Code Status: Prior  Home/SNF/Other Home Patient oriented to: self, place, time and situation Is this baseline? Yes   Triage Complete: Triage complete  Chief Complaint Sob,hyperglycemia  Triage Note Pt coming by EMS after calling with complaints of bilateral leg swelling and shob. States used cocaine today. CBG 548. Pt placed on 2 L [place] by EMS for comfort   Allergies Allergies  Allergen Reactions  . Haldol [Haloperidol] Other (See Comments)    Muscle spasms, loss of voluntary movement. However, pt has taken Thorazine on multiple occasions with no adverse effects.     Level of Care/Admitting Diagnosis ED Disposition    ED Disposition Condition Comment   Admit  Hospital Area: [facility] [[id number]]  Level of Care: Medical Telemetry [104]  I expect the patient will be discharged within 24 hours: Yes  LOW acuity---Tx typically complete <24 hrs---ACUTE conditions typically can be evaluated <24 hours---LABS likely to return to acceptable levels <24 hours---IS near functional baseline---EXPECTED to return to current living arrangement---NOT newly hypoxic: Meets criteria for 5C-Observation unit  Diagnosis: Acute on chronic combined systolic and diastolic congestive heart failure [facility]) [[id number]]  Admitting Physician: Bobette Mo [[id number]]  Attending Physician: Bobette Mo [[id number]]  PT Class (Do Not Modify): Observation [104]  PT Acc Code (Do Not Modify): Observation [10022]       B Medical/Surgery History Past Medical History:  Diagnosis Date  . CHF (congestive heart failure) (HCC)   . Chronic foot pain   . Cocaine abuse (HCC)   . Diabetes mellitus without complication (HCC)   . Hepatitis C    unsure   . Homelessness   . Hypertension   . Neuropathy   . Polysubstance abuse  (HCC)   . Schizophrenia (HCC)   . Sleep apnea    Past Surgical History:  Procedure Laterality Date  . MULTIPLE TOOTH EXTRACTIONS       A IV Location/Drains/Wounds Patient Lines/Drains/Airways Status   Active Line/Drains/Airways    Name:   Placement date:   Placement time:   Site:   Days:   Peripheral IV   -    -    -      Peripheral IV 09/01/18 Left Antecubital   09/01/18    -    Antecubital   1          Intake/Output Last 24 hours No intake or output data in the 24 hours ending 09/02/18 0044  Labs/Imaging Results for orders placed or performed during the hospital encounter of 09/01/18 (from the past 48 hour(s))  Comprehensive metabolic panel     Status: Abnormal   Collection Time: 09/01/18  8:13 PM  Result Value Ref Range   Sodium 133 (L) 135 - 145 mmol/L   Potassium 4.3 3.5 - 5.1 mmol/L   Chloride 105 98 - 111 mmol/L   CO2 22 22 - 32 mmol/L   Glucose, Bld 359 (H) 70 - 99 mg/dL   BUN 21 (H) 6 - 20 mg/dL   Creatinine, Ser 5.46 0.61 - 1.24 mg/dL   Calcium 8.7 (L) 8.9 - 10.3 mg/dL   Total Protein 6.3 (L) 6.5 - 8.1 g/dL   Albumin 2.8 (L) 3.5 - 5.0 g/dL   AST 49 (H) 15 - 41 U/L   ALT 25 0 - 44 U/L   Alkaline  Phosphatase 94 38 - 126 U/L   Total Bilirubin 0.8 0.3 - 1.2 mg/dL   GFR calc non Af Amer >60 >60 mL/min   GFR calc Af Amer >60 >60 mL/min   Anion gap 6 5 - 15    Comment: Performed at [facility], [address], [place], [place] [address]  CBC WITH DIFFERENTIAL     Status: Abnormal   Collection Time: 09/01/18  8:13 PM  Result Value Ref Range   WBC 9.2 4.0 - 10.5 K/uL   RBC 4.22 4.22 - 5.81 MIL/uL   Hemoglobin 11.0 (L) 13.0 - 17.0 g/dL   HCT 36.4 (L) 68.0 - 32.1 %   MCV 86.3 80.0 - 100.0 fL   MCH 26.1 26.0 - 34.0 pg   MCHC 30.2 30.0 - 36.0 g/dL   RDW 22.4 (H) 82.5 - 00.3 %   Platelets 290 150 - 400 K/uL   nRBC 0.0 0.0 - 0.2 %   Neutrophils Relative % 83 %   Neutro Abs 7.6 1.7 - 7.7 K/uL   Lymphocytes Relative 11 %   Lymphs Abs 1.0 0.7 - 4.0 K/uL    Monocytes Relative 5 %   Monocytes Absolute 0.5 0.1 - 1.0 K/uL   Eosinophils Relative 0 %   Eosinophils Absolute 0.0 0.0 - 0.5 K/uL   Basophils Relative 1 %   Basophils Absolute 0.1 0.0 - 0.1 K/uL   Immature Granulocytes 0 %   Abs Immature Granulocytes 0.03 0.00 - 0.07 K/uL    Comment: Performed at [facility], [address], [place], [place] [address]  POC CBG, ED     Status: Abnormal   Collection Time: 09/01/18  8:19 PM  Result Value Ref Range   Glucose-Capillary 353 (H) 70 - 99 mg/dL  Brain natriuretic peptide     Status: Abnormal   Collection Time: 09/01/18  9:20 PM  Result Value Ref Range   B Natriuretic Peptide 1,325.2 (H) 0.0 - 100.0 pg/mL    Comment: Performed at [facility], [address] [place], [place] [address]  I-Stat Troponin, ED (not at [facility])     Status: None   Collection Time: 09/01/18  9:28 PM  Result Value Ref Range   Troponin i, poc 0.04 0.00 - 0.08 ng/mL   Comment 3            Comment: Due to the release kinetics of cTnI, a negative result within the first hours of the onset of symptoms does not rule out myocardial infarction with certainty. If myocardial infarction is still suspected, repeat the test at appropriate intervals.   POCT I-Stat EG7     Status: Abnormal   Collection Time: 09/01/18  9:55 PM  Result Value Ref Range   pH, Ven 7.406 7.250 - 7.430   pCO2, Ven 40.0 (L) 44.0 - 60.0 mmHg   pO2, Ven 37.0 32.0 - 45.0 mmHg   Bicarbonate 25.2 20.0 - 28.0 mmol/L   TCO2 26 22 - 32 mmol/L   O2 Saturation 71.0 %   Sodium 138 135 - 145 mmol/L   Potassium 5.4 (H) 3.5 - 5.1 mmol/L   Calcium, Ion 1.16 1.15 - 1.40 mmol/L   HCT 33.0 (L) 39.0 - 52.0 %   Hemoglobin 11.2 (L) 13.0 - 17.0 g/dL   Patient temperature HIDE    Sample type VENOUS    Comment NOTIFIED PHYSICIAN    Dg Chest 2 View  Result Date: 09/01/2018 CLINICAL DATA:  Shortness of breath  EXAM: CHEST - 2 VIEW COMPARISON:  08/29/2018, 08/24/2018 FINDINGS: Cardiomegaly with vascular  congestion and mild diffuse interstitial and ground-glass opacity. Trace pleural effusions. No pneumothorax. Subsegmental atelectasis or scar in the left mid lung. IMPRESSION: 1. Cardiomegaly with vascular congestion and mild diffuse interstitial and ground-glass opacity suspect for minimal edema. Trace pleural effusions. Electronically Signed   By: Jasmine PangKim  Fujinaga M.D.   On: 09/01/2018 23:15    Pending Labs Unresulted Labs (From admission, onward)    Start     Ordered   09/01/18 2014  Rapid urine drug screen (hospital performed)  ONCE - STAT,   R     09/01/18 2013   09/01/18 2014  Ethanol  ONCE - STAT,   STAT     09/01/18 2013   09/01/18 2013  Urinalysis, Routine w reflex microscopic  Once,   R     09/01/18 2013   09/01/18 2013  Blood gas, venous (at [facility] and [facility])  ONCE - STAT,   STAT     09/01/18 2013          Vitals/Pain Today's Vitals   09/01/18 2153 09/01/18 2154 09/01/18 2231 09/02/18 0030  BP: (!) 174/104 (!) 174/104    Pulse:      Resp:  19  18  Temp:      TempSrc:      SpO2:  100%    Height:      PainSc:   Asleep     Isolation Precautions No active isolations  Medications Medications  furosemide (LASIX) injection 40 mg (has no administration in time range)    Mobility walks Low fall risk   Focused Assessments Neuro   R Recommendations: See Admitting Provider Note  Report given to:   Additional Notes: None

## 2018-09-02 NOTE — Plan of Care (Signed)
Attempted to educate patient on importance of heart healthy/carb modified diet with a fluid restriction.  Patient states understanding however consistently requests more graham crackers, milk, and ice cream.  Education reiterated. Patient not accepting education at this time and becomes agitated and irritable when told his diet is restricted.

## 2018-09-02 NOTE — Progress Notes (Signed)
2979- RN entered pts room for morning rounds, pt demanding another tray of breakfast, some ice cream and milk, RN stated to pt that we did not carry food on the floor and reminded him of the fluid restriction he was on. Pt then started screaming and cursing at this RN stating "you white people think you know everything" RN tried to tell patient to calm down that he can not speak to staff that way and patient ever further esclated throwing a urinal full of pee and a cup of applesauce at the nurse. RN called security to come to bedside.  Security to room

## 2018-09-02 NOTE — Progress Notes (Signed)
Upon walking onto the unit this morning, approached by security( Security is very much aware of this patient )  and charge nurse about patient. Patient was very upset and yelling and screaming at the staff , threw his applesauce, coffee and urinal at the nurse. Upon entering the room patient was using foul language and stating that "all white people treat me so bad". Security was at bedside trying to de-esculate the situation. After patient calmed down he allowed me to change his bed and gown, make him comfortable, and bring him his am medication. After security left patient started getting upset again and stating repeatedly that he wants to  [place] himself. He is not going to be treated badly by so many people, that he is done, and wants to kill himself so bad." RN called the [facility] and staff placement requesting a suicide sitter, room was prepped with suicide precautions following the policy. Will continue to monitor patient.

## 2018-09-02 NOTE — Progress Notes (Addendum)
Pt requesting extra tray of food. RN ordered one  1408- pt eating second tray of food - asking for sprite - RN tried to educate pt that he was already over his fluid restriction for the day. Pt started YELLING at RN about how he is being treated like a slave that we wont allow to have anything to drink. RN tried to de-escalate pt by was unscuccesful, pt slamming things on his tray still yelling. When RN stated we would call security if he did not calm down pt started to settle down stating that he would be back on the streets if security had to come back up here and he would "die on the streets." Half a cup of water provided to patient although pt is already over fluid restrictions for the day.

## 2018-09-02 NOTE — Progress Notes (Signed)
PROGRESS NOTE    [name]  BPZ:[id number] DOB: 1961/03/06 DOA: 09/01/2018 PCP: Patient, No Pcp Per    Brief Narrative:  Patient is a 58 year old gentleman history of significant combined systolic and diastolic CHF, chronic foot pain, frequent cocaine abuse, type 2 diabetes, history of hep C, homelessness, hypertension, diabetic peripheral neuropathy, polysubstance abuse, schizophrenia, sleep apnea not on CPAP presented to the ED with progressive worsening shortness of breath, lower extremity edema, occasional productive of whitish sputum cough.  Patient also with complaints of suprapubic abdominal pain with straining when urinating.  Patient placed on IV Lasix for acute CHF exacerbation. The morning of 09/02/2018 patient noted to be agitated yelling with suicidal ideations per RN.  Psych consulted.   Assessment & Plan:   Principal Problem:   Acute on chronic combined systolic and diastolic congestive heart failure (HCC) Active Problems:   Insulin-requiring or dependent type II diabetes mellitus (HCC)   Essential hypertension, benign   Chronic paranoid schizophrenia (HCC)   Homelessness   Tobacco use   Urinary hesitancy   Constipation  1 acute on chronic combined systolic diastolic heart failure Likely secondary to inability to get his medications as patient is homeless and states the last time he was discharged and went to the pharmacy only received 2 medications.  Patient had presented with shortness of breath, lower extremity edema.  Chest x-ray consistent with pulmonary edema.  BNP was elevated at 1325.  Point-of-care troponin was 0.04.  Cycle cardiac enzymes every 6 hours x3.  Repeat 2D echo.  Patient with a urine output of 1.175 L since admission.  Continue Lasix 40 mg IV every 12 hours.  Resume aspirin, Lipitor, lisinopril 5 mg daily.  Strict I's and O's.  Daily weights.  Follow.  2.  Hypertension Patient was supposed to be on Norvasc and lisinopril however noted not to be  on any of them.  Patient stated when he was last discharge went to the pharmacy and was only given 2 medications.  Patient on IV Lasix.  Resume lisinopril 5 mg daily.  Follow.  3.  Tobacco abuse Placed on a nicotine patch.  4.  Insulin-dependent diabetes mellitus type 2 Hemoglobin A1c was 7.3 on 07/18/2018.  CBG of 282 this morning.  Place on home regimen Lantus 10 units daily.  Continue sliding scale insulin.  5.  Urinary hesitancy Likely secondary to probable BPH versus urinary retention.  Patient refusing bladder catheter to be placed or bladder scan at this time.  Per RN patient with good urine output however is straining when he urinates per patient.  Placed on Flomax daily.  Follow for now.  6.  Chronic paranoid schizophrenia Not on any medications per home med rec.  Psychiatry consulted due to patient's suicidal ideation.  7.  Suicidal ideation Per RN patient with suicidal ideation 3 times this morning.  On assessing patient patient seems to be agitated and preoccupied with not getting enough food to eat.  Due to patient's suicidal ideation psychiatry has been consulted.  Will place a one-to-one sitter for now until patient has been assessed by psychiatry.  8.  Constipation Place on MiraLAX 17 g daily.  Senokot-S twice daily.  9.  Hyperlipidemia Check a fasting lipid panel.  Resume statin.  10.  Homelessness Social work consulted.   DVT prophylaxis: Lovenox Code Status: Full Family Communication: Updated patient.  No family at bedside. Disposition Plan: To be determined.  Patient noted to be homeless.  Social work consulted.   Consultants:  Psychiatry pending  Procedures:   Chest x-ray 09/01/2018    Antimicrobials:  None   Subjective: Patient agitated.  Patient screaming.  Patient seems to be complaining that he does not have enough food to eat and food is being refused to be given to him.  Per RN patient did state 3 times that he wanted to kill himself however  patient currently denying this.  Patient still with some shortness of breath however seems to be improving since admission however not at baseline.  Patient does state when he was recently discharged when he went to the pharmacy only got 2 medications.  Patient complaining of straining to urinate and states whenever he tries to urinate he passes small balls of stool.  Objective: Vitals:   09/02/18 0120 09/02/18 0121 09/02/18 0306 09/02/18 0325  BP: (!) 163/101  (!) 185/102 (!) 148/85  Pulse: 95  (!) 114 99  Resp: (!) 21 (!) 26 (!) 26 20  Temp: 97.9 F (36.6 C)  97.8 F (36.6 C)   TempSrc: Oral  Oral   SpO2: (!) 89% 93% 92% 91%  Weight: 90.2 kg     Height: 5\' 9"  (1.753 m)       Intake/Output Summary (Last 24 hours) at 09/02/2018 1011 Last data filed at 09/02/2018 0955 Gross per 24 hour  Intake 1320 ml  Output 1575 ml  Net -255 ml   Filed Weights   09/02/18 0120  Weight: 90.2 kg    Examination:  General exam: Agitated.  Yelling. Respiratory system: Scattered crackles.  Some bibasilar crackles.  No wheezing.  No use of accessory muscles of respiration.  Yelling.   Cardiovascular system: S1 & S2 heard, RRR. No JVD, murmurs, rubs, gallops or clicks.  1-2+ bilateral lower extremity edema. Gastrointestinal system: Abdomen is nondistended, soft and some tenderness to palpation in the suprapubic region.  Positive bowel sounds.   Central nervous system: Alert and oriented. No focal neurological deficits. Extremities: Symmetric 5 x 5 power.  1-2+ bilateral lower extremity edema. Skin: No rashes, lesions or ulcers Psychiatry: Judgement and insight appear poor to fair.. Mood & affect appropriate.     Data Reviewed: I have personally reviewed following labs and imaging studies  CBC: Recent Labs  Lab 08/29/18 0118 09/01/18 2013 09/01/18 2155 09/02/18 0206  WBC 6.4 9.2  --  8.9  NEUTROABS  --  7.6  --   --   HGB 10.9* 11.0* 11.2* 10.9*  HCT 37.2* 36.4* 33.0* 35.3*  MCV 86.5 86.3   --  83.5  PLT 298 290  --  278   Basic Metabolic Panel: Recent Labs  Lab 08/29/18 0118 09/01/18 2013 09/01/18 2155  NA 139 133* 138  K 3.7 4.3 5.4*  CL 105 105  --   CO2 28 22  --   GLUCOSE 237* 359*  --   BUN 18 21*  --   CREATININE 1.11 0.99  --   CALCIUM 8.9 8.7*  --    GFR: Estimated Creatinine Clearance: 91.4 mL/min (by C-G formula based on SCr of 0.99 mg/dL). Liver Function Tests: Recent Labs  Lab 09/01/18 2013  AST 49*  ALT 25  ALKPHOS 94  BILITOT 0.8  PROT 6.3*  ALBUMIN 2.8*   No results for input(s): LIPASE, AMYLASE in the last 168 hours. No results for input(s): AMMONIA in the last 168 hours. Coagulation Profile: No results for input(s): INR, PROTIME in the last 168 hours. Cardiac Enzymes: No results for input(s): CKTOTAL, CKMB, CKMBINDEX, TROPONINI in  the last 168 hours. BNP (last 3 results) No results for input(s): PROBNP in the last 8760 hours. HbA1C: No results for input(s): HGBA1C in the last 72 hours. CBG: Recent Labs  Lab 08/27/18 2249 08/28/18 1624 09/01/18 2019 09/02/18 0116 09/02/18 0633  GLUCAP 85 235* 353* 287* 282*   Lipid Profile: No results for input(s): CHOL, HDL, LDLCALC, TRIG, CHOLHDL, LDLDIRECT in the last 72 hours. Thyroid Function Tests: No results for input(s): TSH, T4TOTAL, FREET4, T3FREE, THYROIDAB in the last 72 hours. Anemia Panel: No results for input(s): VITAMINB12, FOLATE, FERRITIN, TIBC, IRON, RETICCTPCT in the last 72 hours. Sepsis Labs: No results for input(s): PROCALCITON, LATICACIDVEN in the last 168 hours.  Recent Results (from the past 240 hour(s))  MRSA PCR Screening     Status: None   Collection Time: 09/02/18  1:11 AM  Result Value Ref Range Status   MRSA by PCR NEGATIVE NEGATIVE Final    Comment:        The GeneXpert MRSA Assay (FDA approved for NASAL specimens only), is one component of a comprehensive MRSA colonization surveillance program. It is not intended to diagnose MRSA infection nor to  guide or monitor treatment for MRSA infections. Performed at [facility], [address] [place], [place] [address]          Radiology Studies: Dg Chest 2 View  Result Date: 09/01/2018 CLINICAL DATA:  Shortness of breath EXAM: CHEST - 2 VIEW COMPARISON:  08/29/2018, 08/24/2018 FINDINGS: Cardiomegaly with vascular congestion and mild diffuse interstitial and ground-glass opacity. Trace pleural effusions. No pneumothorax. Subsegmental atelectasis or scar in the left mid lung. IMPRESSION: 1. Cardiomegaly with vascular congestion and mild diffuse interstitial and ground-glass opacity suspect for minimal edema. Trace pleural effusions. Electronically Signed   By: Jasmine PangKim  Fujinaga M.D.   On: 09/01/2018 23:15        Scheduled Meds: . aspirin EC  81 mg Oral Daily  . atorvastatin  40 mg Oral Daily  . enoxaparin (LOVENOX) injection  40 mg Subcutaneous Daily  . furosemide  40 mg Intravenous BID  . insulin aspart  0-9 Units Subcutaneous TID WC  . insulin glargine  10 Units Subcutaneous Daily  . lisinopril  5 mg Oral Daily  . mouth rinse  15 mL Mouth Rinse BID  . nicotine  21 mg Transdermal Daily  . polyethylene glycol  17 g Oral Daily  . potassium chloride SA  20 mEq Oral Daily  . senna-docusate  1 tablet Oral BID  . tamsulosin  0.4 mg Oral QPC breakfast   Continuous Infusions:   LOS: 0 days    Time spent: 40 minutes  No charge.    Ramiro Harvestaniel Sundee Garland, MD [facility]  If 7PM-7AM, please contact night-coverage [email or web address] Password [email or web address] 09/02/2018, 10:11 AM

## 2018-09-02 NOTE — ED Notes (Signed)
Report given to 2C. All questions answered 

## 2018-09-02 NOTE — H&P (Signed)
4        History and Physical    [name] ZOX:[id number] DOB: 19-Nov-1960 DOA: 09/01/2018  PCP: Patient, No Pcp Per   Patient coming from: Home.  I have personally briefly reviewed patient's [facility]  Chief Complaint: Shortness of breath and lower extremity edema.  HPI: [name] is a 58 y.o. male with medical history significant of combined systolic and diastolic CHF, chronic foot pain, frequent cocaine abuse, type 2 diabetes, history of hep C, homelessness, hypertension, diabetic peripheral neuropathy, polysubstance abuse, schizophrenia, sleep apnea not on CPAP who is coming to the emergency department with complaints of progressively worse dyspnea and lower extremity edema associated with occasionally productive of whitish sputum cough.  He denies fever, chills, but feels fatigued.  He denies having chest pain, dizziness, diaphoresis, PND orthopnea.  No abdominal pain, diarrhea, constipation, melena or hematochezia.  Denies dysuria, frequency or hematuria.  ED Course: He received furosemide 40 mg IVP x1. Urinalysis was positive for glucosuria of 500 and proteinuria of 30 mg/dL.  All other values are within normal limits.  UDS positive for cocaine.  White counts 9.2, hemoglobin 11.0 g/dL and platelets [id number].  Troponin was normal.  BNP was 1325.2 pg/mL.  I-STAT EKG 7 showed normal pH.  Sodium is 133, but all other electrolytes are within normal limits when calcium is corrected to albumin.  Glucose 359, BUN 21, creatinine 0.99 and calcium 8.7 mg/dL.  Total protein was 6.3 and albumin 2.8 g/dL.  AST is slightly elevated at 49 units/L, the rest of the hepatic functions are within normal limits.  Ethanol less than 10.  Review of Systems: As per HPI otherwise 10 point review of systems negative.   Past Medical History:  Diagnosis Date  . CHF (congestive heart failure) (HCC)   . Chronic foot pain   . Cocaine abuse (HCC)   . Diabetes mellitus  without complication (HCC)   . Hepatitis C    unsure   . Homelessness   . Hypertension   . Neuropathy   . Polysubstance abuse (HCC)   . Schizophrenia (HCC)   . Sleep apnea     Past Surgical History:  Procedure Laterality Date  . MULTIPLE TOOTH EXTRACTIONS       reports that he has been smoking cigarettes. He has a 20.00 pack-year smoking history. He uses smokeless tobacco. He reports current alcohol use. He reports current drug use. Frequency: 7.00 times per week. Drugs: "Crack" cocaine, Cocaine, and Marijuana.  Allergies  Allergen Reactions  . Haldol [Haloperidol] Other (See Comments)    Muscle spasms, loss of voluntary movement. However, pt has taken Thorazine on multiple occasions with no adverse effects.     Family History  Problem Relation Age of Onset  . Hypertension Other   . Diabetes Other    Prior to Admission medications   Medication Sig Start Date End Date Taking? Authorizing Provider  amLODipine (NORVASC) 10 MG tablet Take 1 tablet (10 mg total) by mouth daily. Patient not taking: Reported on 08/25/2018 08/03/18   Gilda CreasePollina, Christopher J, MD  aspirin EC 81 MG tablet Take 1 tablet (81 mg total) by mouth daily. Patient not taking: Reported on 08/18/2018 08/09/18 08/09/19  Berton Mountgbata, Sylvester I, MD  atorvastatin (LIPITOR) 40 MG tablet Take 1 tablet (40 mg total) by mouth daily. Patient not taking: Reported on 08/25/2018 08/03/18   Gilda CreasePollina, Christopher J, MD  bisacodyl (DULCOLAX) 5 MG EC tablet Take 1 tablet (5 mg  total) by mouth daily as needed for moderate constipation. Patient not taking: Reported on 08/28/2018 08/26/18   Joseph Art, DO  furosemide (LASIX) 40 MG tablet Take 1 tablet (40 mg total) by mouth 2 (two) times daily. Patient not taking: Reported on 08/28/2018 08/26/18   Joseph Art, DO  Insulin Glargine (LANTUS) 100 UNIT/ML Solostar Pen Inject 10 Units into the skin daily. Patient not taking: Reported on 08/28/2018 08/26/18   Joseph Art, DO  Insulin Pen Needle  29G X MISC Use with insulin 08/03/18   Gilda Crease, MD  lisinopril (PRINIVIL,ZESTRIL) 5 MG tablet Take 1 tablet (5 mg total) by mouth daily. Patient not taking: Reported on 08/25/2018 08/03/18   Gilda Crease, MD  nicotine (NICODERM CQ - DOSED IN MG/24 HOURS) 21 mg/24hr patch Place 1 patch (21 mg total) onto the skin daily. Patient not taking: Reported on 08/28/2018 08/26/18   Joseph Art, DO  potassium chloride 20 MEQ TBCR Take 20 mEq by mouth daily. Patient not taking: Reported on 08/28/2018 08/26/18   Joseph Art, DO    Physical Exam: Vitals:   09/02/18 0120 09/02/18 0121 09/02/18 0306 09/02/18 0325  BP: (!) 163/101  (!) 185/102 (!) 148/85  Pulse: 95  (!) 114 99  Resp: (!) 21 (!) 26 (!) 26 20  Temp: 97.9 F (36.6 C)  97.8 F (36.6 C)   TempSrc: Oral  Oral   SpO2: (!) 89% 93% 92% 91%  Weight: 90.2 kg     Height:  (1.753 m)       Constitutional: Disheveled, in NAD, calm, comfortable Eyes: PERRL, lids and conjunctivae normal ENMT: Mucous membranes are moist. Posterior pharynx clear of any exudate or lesions. Poor state of repair and hygiene of dentition/gums. Neck: normal, supple, no masses, no thyromegaly Respiratory: Bibasilar rales, no wheezing, no crackles. Normal respiratory effort. No accessory muscle use.  Cardiovascular: Regular rate and rhythm, no murmurs / rubs / gallops.  3+ lower extremities edema. 2+ pedal pulses. No carotid bruits.  Abdomen: no tenderness, no masses palpated. No hepatosplenomegaly. Bowel sounds positive.  Musculoskeletal: no clubbing / cyanosis. No joint deformity upper and lower extremities. Good ROM, no contractures. Normal muscle tone.  Skin: Hyperkeratosis of the soles and pretibial areas.  Unable to fully evaluate. Neurologic: CN 2-12 grossly intact. Sensation intact, DTR normal. Strength 5/5 in all 4.  Psychiatric:  Alert and oriented x 3.  Labs on Admission: I have personally reviewed following labs and imaging  studies  CBC: Recent Labs  Lab 08/29/18 0118 09/01/18 2013 09/01/18 2155 09/02/18 0206  WBC 6.4 9.2  --  8.9  NEUTROABS  --  7.6  --   --   HGB 10.9* 11.0* 11.2* 10.9*  HCT 37.2* 36.4* 33.0* 35.3*  MCV 86.5 86.3  --  83.5  PLT 298 290  --  278   Basic Metabolic Panel: Recent Labs  Lab 08/26/18 0453 08/29/18 0118 09/01/18 2013 09/01/18 2155  NA 140 139 133* 138  K 3.6 3.7 4.3 5.4*  CL 106 105 105  --   CO2 --   GLUCOSE 117* 237* 359*  --   BUN 24* 18 21*  --   CREATININE 1.09 1.11 0.99  --   CALCIUM 8.7* 8.9 8.7*  --    GFR: Estimated Creatinine Clearance: 91.4 mL/min (by C-G formula based on SCr of 0.99 mg/dL). Liver Function Tests: Recent Labs  Lab 09/01/18 2013  AST 49*  ALT 25  ALKPHOS 94  BILITOT 0.8  PROT 6.3*  ALBUMIN 2.8*   No results for input(s): LIPASE, AMYLASE in the last 168 hours. No results for input(s): AMMONIA in the last 168 hours. Coagulation Profile: No results for input(s): INR, PROTIME in the last 168 hours. Cardiac Enzymes: No results for input(s): CKTOTAL, CKMB, CKMBINDEX, TROPONINI in the last 168 hours. BNP (last 3 results) No results for input(s): PROBNP in the last 8760 hours. HbA1C: No results for input(s): HGBA1C in the last 72 hours. CBG: Recent Labs  Lab 08/27/18 1831 08/27/18 2249 08/28/18 1624 09/01/18 2019 09/02/18 0116  GLUCAP 321* 85 235* 353* 287*   Lipid Profile: No results for input(s): CHOL, HDL, LDLCALC, TRIG, CHOLHDL, LDLDIRECT in the last 72 hours. Thyroid Function Tests: No results for input(s): TSH, T4TOTAL, FREET4, T3FREE, THYROIDAB in the last 72 hours. Anemia Panel: No results for input(s): VITAMINB12, FOLATE, FERRITIN, TIBC, IRON, RETICCTPCT in the last 72 hours. Urine analysis:    Component Value Date/Time   COLORURINE YELLOW 09/02/2018 0126   APPEARANCEUR CLEAR 09/02/2018 0126   LABSPEC 1.020 09/02/2018 0126   PHURINE 5.0 09/02/2018 0126   GLUCOSEU >=500 (A) 09/02/2018 0126    HGBUR NEGATIVE 09/02/2018 0126   BILIRUBINUR NEGATIVE 09/02/2018 0126   KETONESUR NEGATIVE 09/02/2018 0126   PROTEINUR 30 (A) 09/02/2018 0126   UROBILINOGEN 1.0 03/14/2015 0610   NITRITE NEGATIVE 09/02/2018 0126   LEUKOCYTESUR NEGATIVE 09/02/2018 0126    Radiological Exams on Admission: Dg Chest 2 View  Result Date: 09/01/2018 CLINICAL DATA:  Shortness of breath EXAM: CHEST - 2 VIEW COMPARISON:  08/29/2018, 08/24/2018 FINDINGS: Cardiomegaly with vascular congestion and mild diffuse interstitial and ground-glass opacity. Trace pleural effusions. No pneumothorax. Subsegmental atelectasis or scar in the left mid lung. IMPRESSION: 1. Cardiomegaly with vascular congestion and mild diffuse interstitial and ground-glass opacity suspect for minimal edema. Trace pleural effusions. Electronically Signed   By: [name]   On: 09/01/2018 23:15   03/16/2018 echocardiogram complete ------------------------------------------------------------------- LV EF: 30% - 35%  ------------------------------------------------------------------- Indications: 429.3 Cardiomegaly.  ------------------------------------------------------------------- History: PMH: Drug overdose. Schizoaffective disorder. Cocaine abuse. Congestive heart failure.  ------------------------------------------------------------------- Study Conclusions  - Left ventricle: The cavity size was normal. There was severe concentric hypertrophy. Systolic function was moderately to severely reduced. The estimated ejection fraction was in the range of 30% to 35%. There is hypokinesis of the inferoseptal myocardium. Doppler parameters are consistent with a reversible restrictive pattern, indicative of decreased left ventricular diastolic compliance and/or increased left atrial pressure (grade 3 diastolic dysfunction). - Aortic valve: Trileaflet; mildly thickened, mildly calcified leaflets. There was mild  regurgitation. - Mitral valve: There was moderate regurgitation. - Left atrium: The atrium was severely dilated.  Impressions:  - EF is reduced when compared to prior. EKG: Independently reviewed. Vent. rate 96 BPM PR interval * ms QRS duration 89 ms QT/QTc 350/443 ms P-R-T axes 20 -6 168 Sinus rhythm Probable left atrial enlargement Nonspecific T abnormalities, lateral leads  Assessment/Plan Principal Problem:   Acute on chronic combined systolic and diastolic congestive heart failure (HCC) Continue supplemental oxygen. Monitor daily weights, intake and output. Fluid restriction to 1200 mL/24 hr. Restart lisinopril 5 mg p.o. daily. Furosemide 40 mg IVP twice daily. KCl supplementation. Monitor BUN, creatinine and electrolytes.  Active Problems:   Insulin-requiring or dependent type II diabetes mellitus (HCC) Carbohydrate modified diet. Not using Lantus. CBG monitoring regular insulin sliding scale.    Essential hypertension, benign Not taking amlodipine or lisinopril. Resume lisinopril 5  mg p.o. daily. Continue furosemide for CHF. Monitor blood pressure, renal function and electrolytes.    Chronic paranoid schizophrenia (HCC) Currently not taking medications. Denied suicidal or homicidal thoughts.    Homelessness Social services has evaluated the patient.    Tobacco use Nicotine replacement therapy as needed.    DVT prophylaxis: Lovenox. Code Status: Full code. Family Communication: None at bedside Disposition Plan: Observation for CHF observation treatment and social services evaluation. Consults called:  Admission status: Observation/telemetry.   Bobette Mo MD [facility]  09/02/2018, 4:21 AM   This document was prepared using Dragon voice recognition software and may contain some unintended transcription errors.

## 2018-09-02 NOTE — Consult Note (Addendum)
[facility] Face-to-Face Psychiatry Consult   Reason for Consult:  SI Referring Physician:  Dr. Janee Morn  Patient Identification: [name] MRN:  [id number] Principal Diagnosis: Chronic paranoid schizophrenia [facility]) Diagnosis:  Principal Problem:   Chronic paranoid schizophrenia (HCC) Active Problems:   Insulin-requiring or dependent type II diabetes mellitus (HCC)   Essential hypertension, benign   Homelessness   Acute on chronic combined systolic and diastolic congestive heart failure (HCC)   Tobacco use   Urinary hesitancy   Constipation   Total Time spent with patient: 1 hour  Subjective:   [name] is a 58 y.o. male patient admitted with acute on chronic combined systolic and diastolic CHF.   HPI:   Per chart review, patient was admitted with acute on chronic combined systolic and diastolic CHF. Today, he became agitated and was yelling that he was suicidal multiple times to his nurse so psychiatry was consulted. It appears that he was upset that he did not get enough food to eat. He has a history of homelessness and SW was consulted. Of note, he was last seen on 1/25 by telepsych. He endorsed SI in the setting of homelessness. He reported being released from jail after 90 days and subsequently losing his housing. He reported having a new payee and that this person was working on securing housing for him. He was psychiatrically cleared. BAL was negative and UDS was positive for cocaine on admission.   On interview, [name] reports several psychosocial issues and would like to speak to the social worker. He is homeless and therefore unable to maintain treatment compliance. He reports that his new payee who is his cousin has been incorrectly handling his finances. He reports food insecurities. He reports, "I have been kicked out of the hospital 8 times for asking for ice cream." He reports that he just wants food and shelter. He understands that his behaviors are  unacceptable and that he can go to jail if he assaults staff. He reports that he has not taken any medications since he was released from jail a year ago. He previously took Tegretol 200 mg daily and Abilify 10 mg daily. He denies SI, HI or AVH. He reports poor sleep.   Past Psychiatric History: Schizophrenia, cocaine abuse  Risk to Self:  None. Denies SI.  Risk to Others:  None. Denies HI.  Prior Inpatient Therapy:  He has a history of multiple psychiatric hospitalizations.  Prior Outpatient Therapy:   [name]   Past Medical History:  Past Medical History:  Diagnosis Date  . CHF (congestive heart failure) (HCC)   . Chronic foot pain   . Cocaine abuse (HCC)   . Diabetes mellitus without complication (HCC)   . Hepatitis C    unsure   . Homelessness   . Hypertension   . Neuropathy   . Polysubstance abuse (HCC)   . Schizophrenia (HCC)   . Sleep apnea     Past Surgical History:  Procedure Laterality Date  . MULTIPLE TOOTH EXTRACTIONS     Family History:  Family History  Problem Relation Age of Onset  . Hypertension Other   . Diabetes Other    Family Psychiatric  History: None per chart review.  Social History:  Social History   Substance and Sexual Activity  Alcohol Use Yes   Comment: Daily Drinker      Social History   Substance and Sexual Activity  Drug Use Yes  . Frequency: 7.0 times per week  . Types: "Crack"  cocaine, Cocaine, Marijuana   Comment: Cocaine tonight, Marijuana "a long time"    Social History   Socioeconomic History  . Marital status: Single    Spouse name: Not on file  . Number of children: Not on file  . Years of education: Not on file  . Highest education level: Not on file  Occupational History  . Not on file  Social Needs  . Financial resource strain: Not on file  . Food insecurity:    Worry: Not on file    Inability: Not on file  . Transportation needs:    Medical: Not on file    Non-medical: Not on file  Tobacco Use  . Smoking  status: Current Every Day Smoker    Packs/day: 1.00    Years: 20.00    Pack years: 20.00    Types: Cigarettes  . Smokeless tobacco: Current User  Substance and Sexual Activity  . Alcohol use: Yes    Comment: Daily Drinker   . Drug use: Yes    Frequency: 7.0 times per week    Types: "Crack" cocaine, Cocaine, Marijuana    Comment: Cocaine tonight, Marijuana "a long time"  . Sexual activity: Not Currently  Lifestyle  . Physical activity:    Days per week: Not on file    Minutes per session: Not on file  . Stress: Not on file  Relationships  . Social connections:    Talks on phone: Not on file    Gets together: Not on file    Attends religious service: Not on file    Active member of club or organization: Not on file    Attends meetings of clubs or organizations: Not on file    Relationship status: Not on file  Other Topics Concern  . Not on file  Social History Narrative   ** Merged History Encounter **       Additional Social History: He is homeless. He reports daily crack cocaine use.     Allergies:   Allergies  Allergen Reactions  . Haldol [Haloperidol] Other (See Comments)    Muscle spasms, loss of voluntary movement. However, pt has taken Thorazine on multiple occasions with no adverse effects.     Labs:  Results for orders placed or performed during the hospital encounter of 09/01/18 (from the past 48 hour(s))  Comprehensive metabolic panel     Status: Abnormal   Collection Time: 09/01/18  8:13 PM  Result Value Ref Range   Sodium 133 (L) 135 - 145 mmol/L   Potassium 4.3 3.5 - 5.1 mmol/L   Chloride 105 98 - 111 mmol/L   CO2 22 22 - 32 mmol/L   Glucose, Bld 359 (H) 70 - 99 mg/dL   BUN 21 (H) 6 - 20 mg/dL   Creatinine, Ser 6.57 0.61 - 1.24 mg/dL   Calcium 8.7 (L) 8.9 - 10.3 mg/dL   Total Protein 6.3 (L) 6.5 - 8.1 g/dL   Albumin 2.8 (L) 3.5 - 5.0 g/dL   AST 49 (H) 15 - 41 U/L   ALT 25 0 - 44 U/L   Alkaline Phosphatase 94 38 - 126 U/L   Total Bilirubin 0.8  0.3 - 1.2 mg/dL   GFR calc non Af Amer >60 >60 mL/min   GFR calc Af Amer >60 >60 mL/min   Anion gap 6 5 - 15    Comment: Performed at [facility], [address] [place], [place] [address]  CBC WITH DIFFERENTIAL  Status: Abnormal   Collection Time: 09/01/18  8:13 PM  Result Value Ref Range   WBC 9.2 4.0 - 10.5 K/uL   RBC 4.22 4.22 - 5.81 MIL/uL   Hemoglobin 11.0 (L) 13.0 - 17.0 g/dL   HCT 16.1 (L) 09.6 - 04.5 %   MCV 86.3 80.0 - 100.0 fL   MCH 26.1 26.0 - 34.0 pg   MCHC 30.2 30.0 - 36.0 g/dL   RDW 40.9 (H) 81.1 - 91.4 %   Platelets 290 150 - 400 K/uL   nRBC 0.0 0.0 - 0.2 %   Neutrophils Relative % 83 %   Neutro Abs 7.6 1.7 - 7.7 K/uL   Lymphocytes Relative 11 %   Lymphs Abs 1.0 0.7 - 4.0 K/uL   Monocytes Relative 5 %   Monocytes Absolute 0.5 0.1 - 1.0 K/uL   Eosinophils Relative 0 %   Eosinophils Absolute 0.0 0.0 - 0.5 K/uL   Basophils Relative 1 %   Basophils Absolute 0.1 0.0 - 0.1 K/uL   Immature Granulocytes 0 %   Abs Immature Granulocytes 0.03 0.00 - 0.07 K/uL    Comment: Performed at [facility], [address], [place], [place] [address]  POC CBG, ED     Status: Abnormal   Collection Time: 09/01/18  8:19 PM  Result Value Ref Range   Glucose-Capillary 353 (H) 70 - 99 mg/dL  Brain natriuretic peptide     Status: Abnormal   Collection Time: 09/01/18  9:20 PM  Result Value Ref Range   B Natriuretic Peptide 1,325.2 (H) 0.0 - 100.0 pg/mL    Comment: Performed at [facility], [address], [place], [place] [address]  I-Stat Troponin, ED (not at [facility])     Status: None   Collection Time: 09/01/18  9:28 PM  Result Value Ref Range   Troponin i, poc 0.04 0.00 - 0.08 ng/mL   Comment 3            Comment: Due to the release kinetics of cTnI, a negative result within the first hours of the onset of symptoms does not rule out myocardial infarction with certainty. If myocardial infarction is still suspected, repeat the test at appropriate intervals.    POCT I-Stat EG7     Status: Abnormal   Collection Time: 09/01/18  9:55 PM  Result Value Ref Range   pH, Ven 7.406 7.250 - 7.430   pCO2, Ven 40.0 (L) 44.0 - 60.0 mmHg   pO2, Ven 37.0 32.0 - 45.0 mmHg   Bicarbonate 25.2 20.0 - 28.0 mmol/L   TCO2 26 22 - 32 mmol/L   O2 Saturation 71.0 %   Sodium 138 135 - 145 mmol/L   Potassium 5.4 (H) 3.5 - 5.1 mmol/L   Calcium, Ion 1.16 1.15 - 1.40 mmol/L   HCT 33.0 (L) 39.0 - 52.0 %   Hemoglobin 11.2 (L) 13.0 - 17.0 g/dL   Patient temperature HIDE    Sample type VENOUS    Comment NOTIFIED PHYSICIAN   Ethanol     Status: None   Collection Time: 09/02/18 12:13 AM  Result Value Ref Range   Alcohol, Ethyl (B) <10 <10 mg/dL    Comment: (NOTE) Lowest detectable limit for serum alcohol is 10 mg/dL. For medical purposes only. Performed at [facility], [address] [place], [place] [address]   MRSA PCR Screening     Status: None   Collection Time: 09/02/18  1:11 AM  Result Value Ref Range  MRSA by PCR NEGATIVE NEGATIVE    Comment:        The GeneXpert MRSA Assay (FDA approved for NASAL specimens only), is one component of a comprehensive MRSA colonization surveillance program. It is not intended to diagnose MRSA infection nor to guide or monitor treatment for MRSA infections. Performed at [facility], [address] [place], [place] [address]   Glucose, capillary     Status: Abnormal   Collection Time: 09/02/18  1:16 AM  Result Value Ref Range   Glucose-Capillary 287 (H) 70 - 99 mg/dL  Urinalysis, Routine w reflex microscopic     Status: Abnormal   Collection Time: 09/02/18  1:26 AM  Result Value Ref Range   Color, Urine YELLOW YELLOW   APPearance CLEAR CLEAR   Specific Gravity, Urine 1.020 1.005 - 1.030   pH 5.0 5.0 - 8.0   Glucose, UA >=500 (A) NEGATIVE mg/dL   Hgb urine dipstick NEGATIVE NEGATIVE   Bilirubin Urine NEGATIVE NEGATIVE   Ketones, ur NEGATIVE NEGATIVE mg/dL   Protein, ur 30 (A) NEGATIVE mg/dL    Nitrite NEGATIVE NEGATIVE   Leukocytes,Ua NEGATIVE NEGATIVE   RBC / HPF 0-5 0 - 5 RBC/hpf   WBC, UA 0-5 0 - 5 WBC/hpf   Bacteria, UA NONE SEEN NONE SEEN   Mucus PRESENT     Comment: Performed at [facility], [address] [place], [place] [address]  Rapid urine drug screen (hospital performed)     Status: Abnormal   Collection Time: 09/02/18  1:27 AM  Result Value Ref Range   Opiates NONE DETECTED NONE DETECTED   Cocaine POSITIVE (A) NONE DETECTED   Benzodiazepines NONE DETECTED NONE DETECTED   Amphetamines NONE DETECTED NONE DETECTED   Tetrahydrocannabinol NONE DETECTED NONE DETECTED   Barbiturates NONE DETECTED NONE DETECTED    Comment: (NOTE) DRUG SCREEN FOR MEDICAL PURPOSES ONLY.  IF CONFIRMATION IS NEEDED FOR ANY PURPOSE, NOTIFY LAB WITHIN 5 DAYS. LOWEST DETECTABLE LIMITS FOR URINE DRUG SCREEN Drug Class                     Cutoff (ng/mL) Amphetamine and metabolites    1000 Barbiturate and metabolites    200 Benzodiazepine                 200 Tricyclics and metabolites     300 Opiates and metabolites        300 Cocaine and metabolites        300 THC                            50 Performed at [facility], [address], [place], [place] [address]   CBC     Status: Abnormal   Collection Time: 09/02/18  2:06 AM  Result Value Ref Range   WBC 8.9 4.0 - 10.5 K/uL   RBC 4.23 4.22 - 5.81 MIL/uL   Hemoglobin 10.9 (L) 13.0 - 17.0 g/dL   HCT 91.4 (L) 78.2 - 95.6 %   MCV 83.5 80.0 - 100.0 fL   MCH 25.8 (L) 26.0 - 34.0 pg   MCHC 30.9 30.0 - 36.0 g/dL   RDW 21.3 (H) 08.6 - 57.8 %   Platelets 278 150 - 400 K/uL   nRBC 0.0 0.0 - 0.2 %    Comment: Performed at [facility], [address], [place], [place] [address]  Glucose, capillary  Status: Abnormal   Collection Time: 09/02/18  6:33 AM  Result Value Ref Range   Glucose-Capillary 282 (H) 70 - 99 mg/dL  Troponin I - Now Then Q6H     Status: Abnormal   Collection Time: 09/02/18 10:11 AM  Result  Value Ref Range   Troponin I 0.04 (HH) <0.03 ng/mL    Comment: CRITICAL RESULT CALLED TO, READ BACK BY AND VERIFIED WITH: C.JOHNSON,RN 1140 09/02/2018 CLARK,S Performed at [facility], [address] [place], [place] [address]   Glucose, capillary     Status: Abnormal   Collection Time: 09/02/18 11:14 AM  Result Value Ref Range   Glucose-Capillary 139 (H) 70 - 99 mg/dL    Current Facility-Administered Medications  Medication Dose Route Frequency Provider Last Rate Last Dose  . acetaminophen (TYLENOL) tablet 650 mg  650 mg Oral Q6H PRN Bobette Mortiz, David Manuel, MD   650 mg at 09/02/18 [id number]   Or  . acetaminophen (TYLENOL) suppository 650 mg  650 mg Rectal Q6H PRN Bobette Mortiz, David Manuel, MD      . aspirin EC tablet 81 mg  81 mg Oral Daily Rodolph Bonghompson, Daniel V, MD   81 mg at 09/02/18 [id number]  . atorvastatin (LIPITOR) tablet 40 mg  40 mg Oral Daily Rodolph Bonghompson, Daniel V, MD   40 mg at 09/02/18 [id number]  . bisacodyl (DULCOLAX) EC tablet 5 mg  5 mg Oral Daily PRN Rodolph Bonghompson, Daniel V, MD      . enoxaparin (LOVENOX) injection 40 mg  40 mg Subcutaneous Daily Bobette Mortiz, David Manuel, MD      . furosemide (LASIX) injection 40 mg  40 mg Intravenous BID Bobette Mortiz, David Manuel, MD   40 mg at 09/02/18 [id number]  . insulin aspart (novoLOG) injection 0-9 Units  0-9 Units Subcutaneous TID WC Bobette Mortiz, David Manuel, MD   5 Units at 09/02/18 [phone]  . insulin glargine (LANTUS) injection 10 Units  10 Units Subcutaneous Daily Mosetta AnisBitonti, Michael T, [facility]   10 Units at 09/02/18 1219  . lisinopril (PRINIVIL,ZESTRIL) tablet 5 mg  5 mg Oral Daily Rodolph Bonghompson, Daniel V, MD   5 mg at 09/02/18 [id number]  . MEDLINE mouth rinse  15 mL Mouth Rinse BID Bobette Mortiz, David Manuel, MD      . nicotine (NICODERM CQ - dosed in mg/24 hours) patch 21 mg  21 mg Transdermal Daily Rodolph Bonghompson, Daniel V, MD   21 mg at 09/02/18 [id number]  . polyethylene glycol (MIRALAX / GLYCOLAX) packet 17 g  17 g Oral Daily Rodolph Bonghompson, Daniel V, MD   17 g at 09/02/18 1220  . potassium chloride SA  (K-DUR,KLOR-CON) CR tablet 20 mEq  20 mEq Oral Daily Rodolph Bonghompson, Daniel V, MD   20 mEq at 09/02/18 [id number]  . senna-docusate (Senokot-S) tablet 1 tablet  1 tablet Oral BID Rodolph Bonghompson, Daniel V, MD   1 tablet at 09/02/18 1219  . tamsulosin (FLOMAX) capsule 0.4 mg  0.4 mg Oral QPC breakfast Rodolph Bonghompson, Daniel V, MD   0.4 mg at 09/02/18 1219    Musculoskeletal: Strength & Muscle Tone: within normal limits Gait & Station: unsteady Patient leans: N/A  Psychiatric Specialty Exam: Physical Exam  Nursing note and vitals reviewed. Constitutional: He is oriented to person, place, and time. He appears well-developed and well-nourished.  HENT:  Head: Normocephalic and atraumatic.  Neck: Normal range of motion.  Respiratory: Effort normal.  Musculoskeletal: Normal range of motion.  Neurological: He is alert and oriented to person, place, and time.  Psychiatric: His speech is normal  and behavior is normal. Thought content normal. His affect is angry. Cognition and memory are normal. He expresses impulsivity.    Review of Systems  Psychiatric/Behavioral: Positive for substance abuse. Negative for hallucinations and suicidal ideas. The patient has insomnia.   All other systems reviewed and are negative.   Blood pressure (!) 148/85, pulse 99, temperature 97.8 F (36.6 C), temperature source Oral, resp. rate 20, height  (1.753 m), weight 90.2 kg, SpO2 91 %.Body mass index is 29.37 kg/m.  General Appearance: Fairly Groomed, middle aged, African American male, wearing a hospital gown who is sitting in bed. NAD.   Eye Contact:  Good  Speech:  Clear and Coherent and Normal Rate  Volume:  Normal  Mood:  Angry  Affect:  Congruent  Thought Process:  Goal Directed, Linear and Descriptions of Associations: Intact  Orientation:  Full (Time, Place, and Person)  Thought Content:  Logical  Suicidal Thoughts:  No  Homicidal Thoughts:  No  Memory:  Immediate;   Good Recent;   Good Remote;   Good  Judgement:   Fair  Insight:  Fair  Psychomotor Activity:  Normal  Concentration:  Concentration: Good and Attention Span: Good  Recall:  Good  Fund of Knowledge:  Good  Language:  Good  Akathisia:  No  Handed:  Right  AIMS (if indicated):   N/A  Assets:  Communication Skills Desire for Improvement Financial Resources/Insurance Social Support  ADL's:  Intact  Cognition:  WNL  Sleep:   Poor   Assessment:  [name] is a 58 y.o. male who was admitted with acute on chronic combined systolic and diastolic CHF. He endorsed SI when he became upset that he was not receiving enough food. He denies SI, HI or AVH today. He has several social stressors and would like to speak to SW. He would like to be restarted on his psychotropic medications so recommend resuming them today. He should follow up with [facility] for further medication management.   Treatment Plan Summary: -Start Tegretol 100 mg BID for mood stabilization. -Start Abilify 10 mg daily for schizophrenia.  -EKG reviewed and QTc 443 on 3/3. Please closely monitor when starting or increasing QTc prolonging agents.  -Recommend CPAP given history of OSA and current insomnia.  -Patient declines substance abuse treatment resources.  -Patient should follow up with [facility] for further medication management.  -Psychiatry will sign off on patient at this time. Please consult psychiatry again as needed.   Disposition: No evidence of imminent risk to self or others at present.   Patient does not meet criteria for psychiatric inpatient admission.  Cherly Beach, DO 09/02/2018 12:24 PM

## 2018-09-03 DIAGNOSIS — G4733 Obstructive sleep apnea (adult) (pediatric): Secondary | ICD-10-CM

## 2018-09-03 LAB — GLUCOSE, CAPILLARY
Glucose-Capillary: 266 mg/dL — ABNORMAL HIGH (ref 70–99)
Glucose-Capillary: 157 mg/dL — ABNORMAL HIGH (ref 70–99)
Glucose-Capillary: 252 mg/dL — ABNORMAL HIGH (ref 70–99)
Glucose-Capillary: 233 mg/dL — ABNORMAL HIGH (ref 70–99)
Glucose-Capillary: 178 mg/dL — ABNORMAL HIGH (ref 70–99)

## 2018-09-03 LAB — BASIC METABOLIC PANEL
Anion gap: 7 (ref 5–15)
BUN: 20 mg/dL (ref 6–20)
CO2: 27 mmol/L (ref 22–32)
Calcium: 8.4 mg/dL — ABNORMAL LOW (ref 8.9–10.3)
Chloride: 104 mmol/L (ref 98–111)
Creatinine, Ser: 1.16 mg/dL (ref 0.61–1.24)
GFR calc Af Amer: >60 mL/min (ref >60)
GLUCOSE: 176 mg/dL — AB (ref 70–99)
Potassium: 3.6 mmol/L (ref 3.5–5.1)
Sodium: 138 mmol/L (ref 135–145)

## 2018-09-03 LAB — CBC
HCT: 32.2 % — ABNORMAL LOW (ref 39.0–52.0)
Hemoglobin: 9.9 g/dL — ABNORMAL LOW (ref 13.0–17.0)
MCH: 25.9 pg — AB (ref 26.0–34.0)
MCHC: 30.7 g/dL (ref 30.0–36.0)
MCV: 84.3 fL (ref 80.0–100.0)
Platelets: 241 10*3/uL (ref 150–400)
RBC: 3.82 MIL/uL — ABNORMAL LOW (ref 4.22–5.81)
RDW: 17.1 % — ABNORMAL HIGH (ref 11.5–15.5)
WBC: 5.9 10*3/uL (ref 4.0–10.5)
nRBC: 0 % (ref 0.0–0.2)

## 2018-09-03 LAB — LIPID PANEL
Cholesterol: 124 mg/dL (ref 0–200)
HDL: 32 mg/dL — ABNORMAL LOW (ref >40)
LDL Cholesterol: 83 mg/dL (ref 0–99)
Total CHOL/HDL Ratio: 3.9 RATIO
Triglycerides: 47 mg/dL (ref <150)
VLDL: 9 mg/dL (ref 0–40)

## 2018-09-03 MED ORDER — SORBITOL 70 % SOLN
30.0000 mL | Freq: Once | Status: AC
  Administered 2018-09-03: 30 mL via ORAL
  Filled 2018-09-03: qty 30

## 2018-09-03 MED ORDER — INSULIN GLARGINE 100 UNIT/ML ~~LOC~~ SOLN
14.0000 [IU] | Freq: Every day | SUBCUTANEOUS | Status: DC
  Administered 2018-09-04: 14 [IU] via SUBCUTANEOUS
  Filled 2018-09-03: qty 0.14

## 2018-09-03 MED ORDER — POTASSIUM CHLORIDE CRYS ER 20 MEQ PO TBCR
40.0000 meq | EXTENDED_RELEASE_TABLET | Freq: Once | ORAL | Status: AC
  Administered 2018-09-03: 40 meq via ORAL
  Filled 2018-09-03: qty 2

## 2018-09-03 MED ORDER — LISINOPRIL 10 MG PO TABS
10.0000 mg | ORAL_TABLET | Freq: Every day | ORAL | Status: DC
  Administered 2018-09-04: 10 mg via ORAL
  Filled 2018-09-03: qty 1

## 2018-09-03 MED ORDER — CARVEDILOL 3.125 MG PO TABS
3.1250 mg | ORAL_TABLET | Freq: Two times a day (BID) | ORAL | Status: DC
  Administered 2018-09-03: 3.125 mg via ORAL
  Filled 2018-09-03: qty 1

## 2018-09-03 MED ORDER — POLYETHYLENE GLYCOL 3350 17 G PO PACK
17.0000 g | PACK | Freq: Two times a day (BID) | ORAL | Status: DC
  Administered 2018-09-03 – 2018-09-04 (×2): 17 g via ORAL
  Filled 2018-09-03: qty 1

## 2018-09-03 NOTE — Progress Notes (Addendum)
PROGRESS NOTE    [name]  PRX:[id number] DOB: 1960-07-07 DOA: 09/01/2018 PCP: Patient, No Pcp Per    Brief Narrative:  Patient is a 58 year old gentleman history of significant combined systolic and diastolic CHF, chronic foot pain, frequent cocaine abuse, type 2 diabetes, history of hep C, homelessness, hypertension, diabetic peripheral neuropathy, polysubstance abuse, schizophrenia, sleep apnea not on CPAP presented to the ED with progressive worsening shortness of breath, lower extremity edema, occasional productive of whitish sputum cough.  Patient also with complaints of suprapubic abdominal pain with straining when urinating.  Patient placed on IV Lasix for acute CHF exacerbation. The morning of 09/02/2018 patient noted to be agitated yelling with suicidal ideations per RN.  Psych consulted.   Assessment & Plan:   Principal Problem:   Chronic paranoid schizophrenia (HCC) Active Problems:   Insulin-requiring or dependent type II diabetes mellitus (HCC)   Essential hypertension, benign   Homelessness   Acute on chronic combined systolic and diastolic congestive heart failure (HCC)   Tobacco use   Urinary hesitancy   Constipation  1 acute on chronic combined systolic diastolic heart failure/cardiomyopathy Likely secondary to inability to get his medications as patient is homeless and states the last time he was discharged and went to the pharmacy only received 2 medications.  Patient had presented with shortness of breath, lower extremity edema.  Chest x-ray consistent with pulmonary edema.  BNP was elevated at 1325.  Point-of-care troponin was 0.04.  Cardiac enzymes minimally elevated at 0.03 and seem to have plateaued.  2D echo done with a EF of 25 to 30%, moderately reduced right ventricular systolic function, severely dilated left atrial size, severely dilated right atrial size, mildly thickened tricuspid valve leaflets.  Moderate to severe tricuspid valvular regurgitation.   Left ventricular global hypokinesis without regional wall motion abnormalities.  EF was 30 to 35% from 2D echo of 03/16/2018.  Patient with some clinical improvement.  Patient with a urine output of 3.655 L over the past 24 hours.  Patient still volume overloaded on examination.  Continue Lasix 40 mg IV every 12 hours.  Continue aspirin, Lipitor.  Increase lisinopril to 10 mg daily.  Will hold off on beta-blocker at this time due to acute decompensation and history of polysubstance abuse.  UDS during this hospitalization noted to be positive for cocaine.  Strict I's and O's.  Daily weights.  Due to worsening ejection fraction will consult with cardiology for further evaluation and management.   2.  Hypertension Patient was supposed to be on Norvasc and lisinopril however noted not to be on any of them.  Patient stated when he was last discharge went to the pharmacy and was only given 2 medications.  Patient on IV Lasix.  Increase lisinopril to 10 mg daily. Follow.  3.  Tobacco abuse Continue nicotine patch.  4.  Insulin-dependent diabetes mellitus type 2 Hemoglobin A1c was 7.3 on 07/18/2018.  CBG of 157 this morning.  Continue Lantus 10 units daily and sliding scale insulin.   5.  Urinary hesitancy Likely secondary to probable BPH versus urinary retention.  Patient refusing Foley catheter to be placed or bladder scan at this time.  Patient started on Flomax yesterday and states straining has improved.  Patient with good urine output.  Follow.    6.  Chronic paranoid schizophrenia Not on any medications per home med rec.  Psychiatry consulted and patient started on Abilify 10 mg daily as well as Tegretol 100 mg twice daily.  Appreciate psychiatry's  input and recommendations.  Will need outpatient follow-up.  7.  Suicidal ideation Per RN patient with suicidal ideation 3 times the morning of 09/02/2018.  Patient less agitated today.  Patient seen in consultation by psychiatry and patient deemed not an  imminent risk to self or others.  One-to-one sitter has been discontinued.  Patient was started on Abilify and Tegretol per psychiatry.  Outpatient follow-up.    8.  Constipation Increase MiraLAX to 17 g twice daily.  Continue Senokot-S twice daily.  Sorbitol p.o. x1.  Will likely need a bowel regimen on discharge.  9.  Hyperlipidemia Fasting lipid panel with LDL of 83.  Continue Lipitor.  10.  Polysubstance abuse Cessation stressed to patient.  Social work consult pending.  11.  OSA CPAP nightly.  12.  Homelessness Social work consulted.   DVT prophylaxis: Lovenox Code Status: Full Family Communication: Updated patient.  No family at bedside. Disposition Plan: To be determined.  Patient noted to be homeless.  Social work consulted.   Consultants:   Psychiatry: Dr. Sharma Covert 09/02/2018  Cardiology pending  Procedures:   Chest x-ray 09/01/2018  2D echo 09/02/2018  Antimicrobials:  None   Subjective: Patient not agitated this morning.  More calm.  No screaming.  States shortness of breath is improving however not at baseline.  States lower extremity edema is improving.  Wanting to speak to Child psychotherapist as he wants to be discharged to a facility instead of back to the streets.  Denies any chest pain.  States straining when urinating improving with starting Flomax.  Stated that he was started back on his mood stabilizer medication per psychiatry yesterday and is appreciative of it.  Complain of constipation.  Hard stool prior to admission per patient.  Objective: Vitals:   09/02/18 2226 09/02/18 2355 09/03/18 0426 09/03/18 0800  BP:  (!) 146/87 (!) 149/93 (!) 146/93  Pulse: 98 95 90 (!) 105  Resp: (!) 25  Temp:  98.1 F (36.7 C) 97.8 F (36.6 C) (!) 97.4 F (36.3 C)  TempSrc:  Oral Oral Oral  SpO2: 96% 96% 92% 90%  Weight:   87.5 kg   Height:        Intake/Output Summary (Last 24 hours) at 09/03/2018 0920 Last data filed at 09/03/2018 0736 Gross per 24 hour    Intake 1320 ml  Output 3255 ml  Net -1935 ml   Filed Weights   09/02/18 0120 09/03/18 0426  Weight: 90.2 kg 87.5 kg    Examination:  General exam: Alert.  Calm this morning. Respiratory system: Scattered crackles.  Bibasilar crackles.  No wheezing.  No use of accessory muscles of respiration.  Speaking in full sentences.   Cardiovascular system: Tachycardic.  No JVD, no murmurs, no rubs.  1+ bilateral lower extremity edema.  Gastrointestinal system: Abdomen is soft, nontender, nondistended, positive bowel sounds.  No rebound.  No guarding.  Central nervous system: Alert and oriented. No focal neurological deficits. Extremities: Symmetric 5 x 5 power.  1+ bilateral lower extremity edema. Skin: No rashes, lesions or ulcers Psychiatry: Judgement and insight appear fair.. Mood & affect appropriate.     Data Reviewed: I have personally reviewed following labs and imaging studies  CBC: Recent Labs  Lab 08/29/18 0118 09/01/18 2013 09/01/18 2155 09/02/18 0206 09/03/18 0257  WBC 6.4 9.2  --  8.9 5.9  NEUTROABS  --  7.6  --   --   --   HGB 10.9* 11.0* 11.2* 10.9* 9.9*  HCT  37.2* 36.4* 33.0* 35.3* 32.2*  MCV 86.5 86.3  --  83.5 84.3  PLT 298 290  --  278 241   Basic Metabolic Panel: Recent Labs  Lab 08/29/18 0118 09/01/18 2013 09/01/18 2155 09/03/18 0257  NA 139 133* 138 138  K 3.7 4.3 5.4* 3.6  CL 105 105  --  104  CO2 28 22  --  27  GLUCOSE 237* 359*  --  176*  BUN 18 21*  --  20  CREATININE 1.11 0.99  --  1.16  CALCIUM 8.9 8.7*  --  8.4*   GFR: Estimated Creatinine Clearance: 76.9 mL/min (by C-G formula based on SCr of 1.16 mg/dL). Liver Function Tests: Recent Labs  Lab 09/01/18 2013  AST 49*  ALT 25  ALKPHOS 94  BILITOT 0.8  PROT 6.3*  ALBUMIN 2.8*   No results for input(s): LIPASE, AMYLASE in the last 168 hours. No results for input(s): AMMONIA in the last 168 hours. Coagulation Profile: No results for input(s): INR, PROTIME in the last 168  hours. Cardiac Enzymes: Recent Labs  Lab 09/02/18 1011 09/02/18 1541 09/02/18 2130  TROPONINI 0.04* 0.03* 0.03*   BNP (last 3 results) No results for input(s): PROBNP in the last 8760 hours. HbA1C: No results for input(s): HGBA1C in the last 72 hours. CBG: Recent Labs  Lab 09/02/18 0633 09/02/18 1114 09/02/18 1710 09/02/18 2103 09/03/18 0643  GLUCAP 282* 139* 233* 266* 157*   Lipid Profile: Recent Labs    09/03/18 0257  CHOL 124  HDL 32*  LDLCALC 83  TRIG 47  CHOLHDL 3.9   Thyroid Function Tests: No results for input(s): TSH, T4TOTAL, FREET4, T3FREE, THYROIDAB in the last 72 hours. Anemia Panel: No results for input(s): VITAMINB12, FOLATE, FERRITIN, TIBC, IRON, RETICCTPCT in the last 72 hours. Sepsis Labs: No results for input(s): PROCALCITON, LATICACIDVEN in the last 168 hours.  Recent Results (from the past 240 hour(s))  MRSA PCR Screening     Status: None   Collection Time: 09/02/18  1:11 AM  Result Value Ref Range Status   MRSA by PCR NEGATIVE NEGATIVE Final    Comment:        The GeneXpert MRSA Assay (FDA approved for NASAL specimens only), is one component of a comprehensive MRSA colonization surveillance program. It is not intended to diagnose MRSA infection nor to guide or monitor treatment for MRSA infections. Performed at [facility], [address] [place], [place] [address]          Radiology Studies: Dg Chest 2 View  Result Date: 09/01/2018 CLINICAL DATA:  Shortness of breath EXAM: CHEST - 2 VIEW COMPARISON:  08/29/2018, 08/24/2018 FINDINGS: Cardiomegaly with vascular congestion and mild diffuse interstitial and ground-glass opacity. Trace pleural effusions. No pneumothorax. Subsegmental atelectasis or scar in the left mid lung. IMPRESSION: 1. Cardiomegaly with vascular congestion and mild diffuse interstitial and ground-glass opacity suspect for minimal edema. Trace pleural effusions. Electronically Signed   By: [name]    On: 09/01/2018 23:15        Scheduled Meds: . ARIPiprazole  10 mg Oral Daily  . aspirin EC  81 mg Oral Daily  . atorvastatin  40 mg Oral Daily  . carbamazepine  100 mg Oral BID  . enoxaparin (LOVENOX) injection  40 mg Subcutaneous Daily  . furosemide  40 mg Intravenous BID  . insulin aspart  0-9 Units Subcutaneous TID WC  . insulin glargine  10 Units Subcutaneous Daily  . lisinopril  5 mg Oral Daily  . mouth rinse  15 mL Mouth Rinse BID  . nicotine  21 mg Transdermal Daily  . polyethylene glycol  17 g Oral Daily  . potassium chloride SA  20 mEq Oral Daily  . senna-docusate  1 tablet Oral BID  . tamsulosin  0.4 mg Oral QPC breakfast   Continuous Infusions:   LOS: 1 day    Time spent: 40 minutes  No charge.    Ramiro Harvest, MD [facility]  If 7PM-7AM, please contact night-coverage [email or web address] Password [email or web address] 09/03/2018, 9:20 AM
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
## 2018-09-04 LAB — BASIC METABOLIC PANEL WITH GFR
Anion gap: 5 (ref 5–15)
BUN: 27 mg/dL — ABNORMAL HIGH (ref 6–20)
CO2: 28 mmol/L (ref 22–32)
Calcium: 8.6 mg/dL — ABNORMAL LOW (ref 8.9–10.3)
Chloride: 102 mmol/L (ref 98–111)
Creatinine, Ser: 1.13 mg/dL (ref 0.61–1.24)
GFR calc Af Amer: >60 mL/min (ref >60)
GFR calc non Af Amer: >60 mL/min (ref >60)
Glucose, Bld: 234 mg/dL — ABNORMAL HIGH (ref 70–99)
Potassium: 3.8 mmol/L (ref 3.5–5.1)
Sodium: 135 mmol/L (ref 135–145)

## 2018-09-04 LAB — GLUCOSE, CAPILLARY: Glucose-Capillary: 270 mg/dL — ABNORMAL HIGH (ref 70–99)

## 2018-09-04 MED ORDER — CARVEDILOL 3.125 MG PO TABS
3.1250 mg | ORAL_TABLET | Freq: Once | ORAL | Status: DC
  Filled 2018-09-04: qty 1

## 2018-09-04 MED ORDER — LISINOPRIL 10 MG PO TABS: 10.0000 mg | ORAL_TABLET | Freq: Every day | ORAL | 1 refills | Status: DC

## 2018-09-04 MED ORDER — INSULIN GLARGINE 100 UNIT/ML SOLOSTAR PEN: 14.0000 [IU] | PEN_INJECTOR | Freq: Every day | SUBCUTANEOUS | 0 refills | Status: DC

## 2018-09-04 MED ORDER — FUROSEMIDE 40 MG PO TABS: 40.0000 mg | ORAL_TABLET | Freq: Every day | ORAL | 1 refills | Status: DC

## 2018-09-04 MED ORDER — CARVEDILOL 6.25 MG PO TABS: 6.2500 mg | ORAL_TABLET | Freq: Two times a day (BID) | ORAL | Status: DC

## 2018-09-04 MED ORDER — POLYETHYLENE GLYCOL 3350 17 G PO PACK: 17.0000 g | PACK | Freq: Two times a day (BID) | ORAL | 0 refills | Status: DC

## 2018-09-04 MED ORDER — NICOTINE 21 MG/24HR TD PT24: 21.0000 mg | MEDICATED_PATCH | Freq: Every day | TRANSDERMAL | 0 refills | Status: DC

## 2018-09-04 MED ORDER — TAMSULOSIN HCL 0.4 MG PO CAPS: 0.4000 mg | ORAL_CAPSULE | Freq: Every day | ORAL | 1 refills | Status: DC

## 2018-09-04 MED ORDER — CARBAMAZEPINE 200 MG PO TABS: 100.0000 mg | ORAL_TABLET | Freq: Two times a day (BID) | ORAL | 0 refills | Status: DC

## 2018-09-04 MED ORDER — SENNOSIDES-DOCUSATE SODIUM 8.6-50 MG PO TABS: 1.0000 | ORAL_TABLET | Freq: Two times a day (BID) | ORAL | Status: DC

## 2018-09-04 MED ORDER — FUROSEMIDE 40 MG PO TABS
40.0000 mg | ORAL_TABLET | Freq: Every day | ORAL | Status: DC
  Administered 2018-09-04: 40 mg via ORAL
  Filled 2018-09-04: qty 1

## 2018-09-04 MED ORDER — ATORVASTATIN CALCIUM 40 MG PO TABS: 40.0000 mg | ORAL_TABLET | Freq: Every day | ORAL | 1 refills | Status: DC

## 2018-09-04 MED ORDER — BISACODYL 5 MG PO TBEC: 5.0000 mg | DELAYED_RELEASE_TABLET | Freq: Every day | ORAL | 0 refills | Status: DC | PRN

## 2018-09-04 MED ORDER — ARIPIPRAZOLE 10 MG PO TABS: 10.0000 mg | ORAL_TABLET | Freq: Every day | ORAL | 0 refills | Status: DC

## 2018-09-04 MED ORDER — POTASSIUM CHLORIDE ER 20 MEQ PO TBCR: 20.0000 meq | EXTENDED_RELEASE_TABLET | Freq: Every day | ORAL | 1 refills | Status: DC

## 2018-09-04 MED ORDER — INSULIN PEN NEEDLE 29G X 8MM MISC: 0 refills | Status: DC

## 2018-09-04 MED ORDER — CARVEDILOL 6.25 MG PO TABS: 6.2500 mg | ORAL_TABLET | Freq: Two times a day (BID) | ORAL | 1 refills | Status: DC

## 2018-09-04 NOTE — Progress Notes (Signed)
Progress Note  Patient Name: [name] Date of Encounter: 09/04/2018  Primary Cardiologist: Kristeen Miss, MD    Subjective   [name] is a 58 y.o. male with a hx of chronic systolic and diastolic heart failure, ongoing cocaine abuse/polysubstance abuse, DM2, Hep C, homelessness, HTN, diabetic peripheral neuropathy, schizophrenia, and OSA (no CPAP) who is being seen today for the evaluation of chest pain at the request of Dr. Janee Morn.  He has diuresed 6.8 L so far during this admission. He is upset this am because he has not received his 2nd breakfast ( has been ordered for double portions)    Inpatient Medications    Scheduled Meds: . ARIPiprazole  10 mg Oral Daily  . aspirin EC  81 mg Oral Daily  . atorvastatin  40 mg Oral Daily  . carbamazepine  100 mg Oral BID  . carvedilol  3.125 mg Oral BID WC  . enoxaparin (LOVENOX) injection  40 mg Subcutaneous Daily  . furosemide  40 mg Intravenous BID  . insulin aspart  0-9 Units Subcutaneous TID WC  . insulin glargine  14 Units Subcutaneous Daily  . lisinopril  10 mg Oral Daily  . mouth rinse  15 mL Mouth Rinse BID  . nicotine  21 mg Transdermal Daily  . polyethylene glycol  17 g Oral BID  . potassium chloride SA  20 mEq Oral Daily  . senna-docusate  1 tablet Oral BID  . tamsulosin  0.4 mg Oral QPC breakfast   Continuous Infusions:  PRN Meds: acetaminophen **OR** acetaminophen, bisacodyl   Vital Signs    Vitals:   09/03/18 2355 09/04/18 0345 09/04/18 0454 09/04/18 0740  BP:  132/80  (!) 153/90  Pulse: 93 99  98  Resp: (!) 21 15  (!) 24  Temp:  98.1 F (36.7 C)  97.9 F (36.6 C)  TempSrc:  Oral  Oral  SpO2: 95% 93%  98%  Weight:   84.6 kg   Height:        Intake/Output Summary (Last 24 hours) at 09/04/2018 0829 Last data filed at 09/04/2018 0740 Gross per 24 hour  Intake 1540 ml  Output 5950 ml  Net -4410 ml   Last 3 Weights 09/04/2018 09/03/2018 09/02/2018  Weight (lbs) 186 lb 8.2 oz 192 lb  14.4 oz 198 lb 13.7 oz  Weight (kg) 84.6 kg 87.5 kg 90.2 kg  Some encounter information is confidential and restricted. Go to Review Flowsheets activity to see all data.      Telemetry     NSR  - Personally Reviewed  ECG     NSR  - Personally Reviewed  Physical Exam   GEN:  middle age, disheveled man.     Neck: No JVD Cardiac: RRR, soft systolic murmur  Respiratory: Clear to auscultation bilaterally. GI: Soft, nontender, non-distended  MS: No edema; No deformity. Neuro:  Nonfocal  Psych: Normal affect   Labs    Chemistry Recent Labs  Lab 09/01/18 2013 09/01/18 2155 09/03/18 0257 09/04/18 0205  NA 133* 138 138 135  K 4.3 5.4* 3.6 3.8  CL 105  --  104 102  CO2 22  --  27 28  GLUCOSE 359*  --  176* 234*  BUN 21*  --  20 27*  CREATININE 0.99  --  1.16 1.13  CALCIUM 8.7*  --  8.4* 8.6*  PROT 6.3*  --   --   --   ALBUMIN 2.8*  --   --   --  AST 49*  --   --   --   ALT 25  --   --   --   ALKPHOS 94  --   --   --   BILITOT 0.8  --   --   --   GFRNONAA >60  --  >60 >60  GFRAA >60  --  >60 >60  ANIONGAP 6  --  7 5     Hematology Recent Labs  Lab 09/01/18 2013 09/01/18 2155 09/02/18 0206 09/03/18 0257  WBC 9.2  --  8.9 5.9  RBC 4.22  --  4.23 3.82*  HGB 11.0* 11.2* 10.9* 9.9*  HCT 36.4* 33.0* 35.3* 32.2*  MCV 86.3  --  83.5 84.3  MCH 26.1  --  25.8* 25.9*  MCHC 30.2  --  30.9 30.7  RDW 17.2*  --  17.2* 17.1*  PLT 290  --  278 241    Cardiac Enzymes Recent Labs  Lab 09/02/18 1011 09/02/18 1541 09/02/18 2130  TROPONINI 0.04* 0.03* 0.03*    Recent Labs  Lab 08/29/18 0125 09/01/18 2128  TROPIPOC 0.05 0.04     BNP Recent Labs  Lab 09/01/18 2120  BNP 1,325.2*     DDimer No results for input(s): DDIMER in the last 168 hours.   Radiology    No results found.  Cardiac Studies      Patient Profile     58 y.o. male with schizophrenia, homelessness, chronic cocaine abuse, acute on chronic combined congestive heart failure.                                                          Assessment & Plan    1.  Acute on chronic combined systolic and diastolic congestive heart failure: Ejection fraction is 25 to 30%.  He has grade 3 diastolic dysfunction.  He has a history of noncompliance because of his homelessness.  He is put out quite a bit of urine.  His lungs are clear and his S3 gallop has resolved.  There is no leg edema.  I have changed his IV Lasix to oral Lasix.  .  Elevated troponin levels: His troponin levels are minimally elevated.  I suspect this is due to demand ischemia from his congestive heart failure:  3.  Hypertension: Blood pressure is better controlled.  Continue lisinopril and carvedilol.  Increase coreg to 6. 25 BID        For questions or updates, please contact [facility] Please consult [email or web address] for contact info under        Signed, Kristeen Miss, MD  09/04/2018, 8:29 AM

## 2018-09-04 NOTE — Care Management Note (Signed)
Case Management Note  Patient Details  Name: [name] MRN: [id number] Date of Birth: Nov 04, 1960  Subjective/Objective:   Pt admitted with CP - pt has had multiple admits - pt has been assessed by multiple CSW and CM during past admits.    Action/Plan:  PTA independent with ambulation and ADLs.  Pt informed CM that he weighs daily and adheres to low salt diet.  Pt has been noncompliant with adhereing to a low salt diet while hospitalized.  CM reiterated the importance of adherence to all recommended regimens.  Pt informed CM that he does not have a PCP because "I been in prison, I stay in prison".  Pt has active medicaid and medicare - CM offered to assist with locating a PCP however pt declined, pt stated "I dont need anything and I am going to use my cousins doctor".Pt denied barriers with returning to previous living arrangements.    Pt declined all CM/SW needs, pt stated "I will be back" and refused to elaborate.  CM attempted to continue conversation with pt however pt stated firmly "I said I don't need anything".  CM signing off   Expected Discharge Date:  09/04/18               Expected Discharge Plan:     In-House Referral:     Discharge planning Services     Post Acute Care Choice:    Choice offered to:     DME Arranged:    DME Agency:     HH Arranged:    HH Agency:     Status of Service:  Completed, signed off  If discussed at [employer] of Stay Meetings, dates discussed:    Additional Comments:  Cherylann Parr, RN 09/04/2018, 9:27 AM

## 2018-09-04 NOTE — Discharge Summary (Signed)
Physician Discharge Summary  [name] [id number] DOB: 08-10-1960 DOA: 09/01/2018  PCP: Patient, No Pcp Per  Admit date: 09/01/2018 Discharge date: 09/04/2018  Time spent: 60 minutes  Recommendations for Outpatient Follow-up:  1. Follow-up with Dr.Nahser, cardiology in 3 weeks.  On follow-up patient will need a basic metabolic profile done to follow-up on electrolytes and renal function. 2. Follow-up with psychiatry in 2 weeks for further management of patient's chronic paranoid schizophrenia. 3. Patient is to pick and follow-up with a PCP in the outpatient setting.   Discharge Diagnoses:  Principal Problem:   Acute on chronic combined systolic and diastolic congestive heart failure (HCC) Active Problems:   Insulin-requiring or dependent type II diabetes mellitus (HCC)   Essential hypertension, benign   Chronic paranoid schizophrenia (HCC)   Homelessness   Tobacco use   Urinary hesitancy   Constipation   Discharge Condition: Stable and improved  Diet recommendation: Heart healthy  Filed Weights   09/02/18 0120 09/03/18 0426 09/04/18 0454  Weight: 90.2 kg 87.5 kg 84.6 kg    History of present illness:  Per Dr. Ezzie Dural is a 58 y.o. male with medical history significant of combined systolic and diastolic CHF, chronic foot pain, frequent cocaine abuse, type 2 diabetes, history of hep C, homelessness, hypertension, diabetic peripheral neuropathy, polysubstance abuse, schizophrenia, sleep apnea not on CPAP who presented to the emergency department with complaints of progressively worse dyspnea and lower extremity edema associated with occasionally productive of whitish sputum cough.  He denied fever, chills, but feels fatigued.  He denied having chest pain, dizziness, diaphoresis, PND orthopnea.  No abdominal pain, diarrhea, constipation, melena or hematochezia.  Denies dysuria, frequency or hematuria.  ED Course: He received furosemide 40 mg IVP x1.  Urinalysis was positive for glucosuria of 500 and proteinuria of 30 mg/dL.  All other values are within normal limits.  UDS positive for cocaine.  White counts 9.2, hemoglobin 11.0 g/dL and platelets 867.  Troponin was normal.  BNP was 1325.2 pg/mL.  I-STAT EKG 7 showed normal pH.  Sodium is 133, but all other electrolytes are within normal limits when calcium is corrected to albumin.  Glucose 359, BUN 21, creatinine 0.99 and calcium 8.7 mg/dL.  Total protein was 6.3 and albumin 2.8 g/dL.  AST is slightly elevated at 49 units/L, the rest of the hepatic functions are within normal limits.  Ethanol less than 10.   Hospital Course:  1 acute on chronic combined systolic diastolic heart failure/cardiomyopathy Likely secondary to inability to get his medications as patient is homeless and stated the last time he was discharged and went to the pharmacy only received 2 medications.  Patient had presented with shortness of breath, lower extremity edema.  Chest x-ray consistent with pulmonary edema.  BNP was elevated at 1325.  Point-of-care troponin was 0.04.  Cardiac enzymes minimally elevated at 0.03 and seem to have plateaued.  2D echo done with a EF of 25 to 30%, moderately reduced right ventricular systolic function, severely dilated left atrial size, severely dilated right atrial size, mildly thickened tricuspid valve leaflets.  Moderate to severe tricuspid valvular regurgitation.  Left ventricular global hypokinesis without regional wall motion abnormalities.  EF was 30 to 35% from 2D echo of 03/16/2018.  Patient needed placed on IV Lasix as well as aspirin, statin.  Coreg was added to patient's regimen per cardiology.  Patient diuresed well during the hospitalization was -6.8 L.  Cardiology was consulted due to patient's worsening ejection fraction  of 25 to 30%.  Patient was seen in consultation by cardiology who adjusted patient's cardiac medications.  Patient was subsequently transitioned from IV Lasix to oral  Lasix as he had improved clinically.  Coreg dose was increased to 6.25 mg twice daily per cardiology.  UDS which was done was positive for cocaine.  Polysubstance abuse cessation was stressed to patient.  Patient was insistent on being discharged and as patient was medically stable and euvolemic patient was discharged in stable and improved condition.  Patient is to follow-up with cardiology 3 weeks post discharge.   2.  Hypertension Patient was supposed to be on Norvasc and lisinopril however noted not to be on any of them.  Patient stated when he was last discharge went to the pharmacy and was only given 2 medications.  Patient during the hospitalization was on IV Lasix and placed on lisinopril 10 mg daily.  Coreg was added to his regimen.  Blood pressure remained better controlled during the hospitalization.  Patient insistent on being discharged and as such patient was discharged in stable condition.    3.  Tobacco abuse Patient was placed on a nicotine patch.   4.  Insulin-dependent diabetes mellitus type 2 Hemoglobin A1c was 7.3 on 07/18/2018.    Patient was placed on Lantus 10 units daily as well as sliding scale insulin.  Outpatient follow-up.    5.  Urinary hesitancy Likely secondary to probable BPH versus urinary retention.  Patient refused Foley catheter to be placed or bladder scan at this time.  Patient started on Flomax with clinical improvement per patient.  Patient noted to have good urine output.  Patient will be discharged on Flomax.   6.  Chronic paranoid schizophrenia Not on any medications per home med rec.  Psychiatry consulted and patient started on Abilify 10 mg daily as well as Tegretol 100 mg twice daily.  Patient will need outpatient follow-up with psychiatry.   7.  Suicidal ideation Per RN patient with suicidal ideation 3 times the morning of 09/02/2018. Patient seen in consultation by psychiatry and patient deemed not an imminent risk to self or others.  One-to-one  sitter has been discontinued.  Patient was started on Abilify and Tegretol per psychiatry.  Outpatient follow-up.    8.  Constipation Patient was placed on a bowel regimen of MiraLAX twice daily as well as Senokot-S twice daily.  Patient also given some sorbitol.  Patient will be discharged on bowel regimen of MiraLAX twice daily and Senokot-S twice daily.   9.  Hyperlipidemia Fasting lipid panel with LDL of 83.  Patient maintained on Lipitor.   10.  Polysubstance abuse Cessation stressed to patient.  Social work consult was pending at the time of discharge.    11.  OSA CPAP nightly.  12.  Homelessness Social work consulted.  Patient was insistent on being discharged and asked patient had improved clinically and was euvolemic patient was discharged.  Procedures:  Chest x-ray 09/01/2018  2D echo 09/02/2018  Consultations:  Cardiology: Dr.Nahser 09/03/2018  Psychiatry: Dr. Sharma Covert 09/02/2018  Discharge Exam: Vitals:   09/04/18 0345 09/04/18 0740  BP: 132/80 (!) 153/90  Pulse: 99 98  Resp: 15 (!) 24  Temp: 98.1 F (36.7 C) 97.9 F (36.6 C)  SpO2: 93% 98%    General: NAD Cardiovascular: Regular rate rhythm no murmurs rubs or gallops.  No JVD.  No lower extremity edema. Respiratory: Clear to auscultation bilaterally.  No wheezes, no crackles, no rhonchi.  Discharge Instructions  Discharge Instructions    Diet - low sodium heart healthy   Complete by:  As directed    Increase activity slowly   Complete by:  As directed      Allergies as of 09/04/2018      Reactions   Haldol [haloperidol] Other (See Comments)   Muscle spasms, loss of voluntary movement. However, pt has taken Thorazine on multiple occasions with no adverse effects.       Medication List    STOP taking these medications   amLODipine 10 MG tablet Commonly known as:  NORVASC     TAKE these medications   ARIPiprazole 10 MG tablet Commonly known as:  ABILIFY Take 1 tablet (10 mg total) by mouth  daily.   aspirin EC 81 MG tablet Take 1 tablet (81 mg total) by mouth daily.   atorvastatin 40 MG tablet Commonly known as:  LIPITOR Take 1 tablet (40 mg total) by mouth daily.   bisacodyl 5 MG EC tablet Commonly known as:  DULCOLAX Take 1 tablet (5 mg total) by mouth daily as needed for moderate constipation.   carbamazepine 200 MG tablet Commonly known as:  TEGRETOL Take 0.5 tablets (100 mg total) by mouth 2 (two) times daily.   carvedilol 6.25 MG tablet Commonly known as:  COREG Take 1 tablet (6.25 mg total) by mouth 2 (two) times daily with a meal.   furosemide 40 MG tablet Commonly known as:  LASIX Take 1 tablet (40 mg total) by mouth daily. What changed:  when to take this   Insulin Glargine 100 UNIT/ML Solostar Pen Commonly known as:  LANTUS Inject 14 Units into the skin daily. What changed:  how much to take   Insulin Pen Needle 29G X Misc Use with insulin   lisinopril 10 MG tablet Commonly known as:  PRINIVIL,ZESTRIL Take 1 tablet (10 mg total) by mouth daily. What changed:    medication strength  how much to take   nicotine 21 mg/24hr patch Commonly known as:  NICODERM CQ - dosed in mg/24 hours Place 1 patch (21 mg total) onto the skin daily.   polyethylene glycol packet Commonly known as:  MIRALAX / GLYCOLAX Take 17 g by mouth 2 (two) times daily.   Potassium Chloride ER 20 MEQ Tbcr Take 20 mEq by mouth daily.   senna-docusate 8.6-50 MG tablet Commonly known as:  Senokot-S Take 1 tablet by mouth 2 (two) times daily.   tamsulosin 0.4 MG Caps capsule Commonly known as:  FLOMAX Take 1 capsule (0.4 mg total) by mouth daily after breakfast.      Allergies  Allergen Reactions  . Haldol [Haloperidol] Other (See Comments)    Muscle spasms, loss of voluntary movement. However, pt has taken Thorazine on multiple occasions with no adverse effects.    Follow-up Information    Nahser, Deloris Ping, MD. Schedule an appointment as soon as possible for  a visit in 3 week(s).   Specialty:  Cardiology Contact information: [address] [place] [address] [phone]        psychiatry. Schedule an appointment as soon as possible for a visit in 2 week(s).   Why:  outpatient psychiatry           The results of significant diagnostics from this hospitalization (including imaging, microbiology, ancillary and laboratory) are listed below for reference.    Significant Diagnostic Studies: Dg Chest 2 View  Result Date: 09/01/2018 CLINICAL DATA:  Shortness of breath EXAM: CHEST -  2 VIEW COMPARISON:  08/29/2018, 08/24/2018 FINDINGS: Cardiomegaly with vascular congestion and mild diffuse interstitial and ground-glass opacity. Trace pleural effusions. No pneumothorax. Subsegmental atelectasis or scar in the left mid lung. IMPRESSION: 1. Cardiomegaly with vascular congestion and mild diffuse interstitial and ground-glass opacity suspect for minimal edema. Trace pleural effusions. Electronically Signed   By: Jasmine PangKim  Fujinaga M.D.   On: 09/01/2018 23:15   Dg Chest 2 View  Result Date: 08/29/2018 CLINICAL DATA:  58 year old male with chest pain EXAM: CHEST - 2 VIEW COMPARISON:  Chest radiograph dated 08/24/2018 FINDINGS: Linear atelectasis/scarring in the left mid lung field. No focal consolidation, pleural effusion, or pneumothorax. Mild cardiomegaly. Atherosclerotic calcification of the aortic arch. No acute osseous pathology. IMPRESSION: No active cardiopulmonary disease. Electronically Signed   By: Elgie CollardArash  Radparvar M.D.   On: 08/29/2018 02:00   Dg Chest 2 View  Result Date: 08/24/2018 CLINICAL DATA:  Chest pain, shortness of breath EXAM: CHEST - 2 VIEW COMPARISON:  08/22/2018 FINDINGS: Cardiomegaly with vascular congestion. No overt edema. Lingular atelectasis or scarring. Small right pleural effusion. No left effusion or acute bony abnormality. IMPRESSION: Cardiomegaly with vascular congestion. Lingular scarring or atelectasis.  Electronically Signed   By: Charlett NoseKevin  Dover M.D.   On: 08/24/2018 22:53   Dg Chest 2 View  Result Date: 08/17/2018 CLINICAL DATA:  Shortness of breath EXAM: CHEST - 2 VIEW COMPARISON:  08/07/2018, 08/03/2018 FINDINGS: Development of small pleural effusions. Cardiomegaly with vascular congestion. Bilateral interstitial and ground-glass opacity which may reflect edema. Patchy consolidations at both bases. Aortic atherosclerosis. IMPRESSION: 1. Cardiomegaly with vascular congestion and underlying interstitial and ground-glass opacities suspect for edema. There are small pleural effusions 2. Increasing airspace disease at both bases may reflect atelectasis or pneumonia Electronically Signed   By: Jasmine PangKim  Fujinaga M.D.   On: 08/17/2018 23:40   Dg Chest 2 View  Result Date: 08/08/2018 CLINICAL DATA:  Initial evaluation for acute shortness of breath EXAM: CHEST - 2 VIEW COMPARISON:  Prior radiograph from 08/03/2018 FINDINGS: Cardiomegaly, stable from previous. Mediastinal silhouette within normal limits. Aortic atherosclerosis. Lungs normally inflated. Diffuse vascular and interstitial congestion, suggesting mild diffuse pulmonary interstitial edema. Small bilateral pleural effusions. Left perihilar atelectasis and/or scarring, stable. No other focal infiltrates. No pneumothorax. No acute osseous finding. IMPRESSION: 1. Cardiomegaly with mild diffuse pulmonary interstitial edema and small bilateral pleural effusions, suggesting acute CHF. 2. Superimposed left perihilar atelectasis/scarring. 3. Aortic atherosclerosis. Electronically Signed   By: Rise MuBenjamin  McClintock M.D.   On: 08/08/2018 00:03   Dg Chest Port 1 View  Result Date: 08/22/2018 CLINICAL DATA:  Chest pain. EXAM: PORTABLE CHEST 1 VIEW COMPARISON:  08/17/2018 FINDINGS: 1819 hours. The cardio pericardial silhouette is enlarged. There is pulmonary vascular congestion without overt pulmonary edema. Probable interstitial pulmonary edema. Left base atelectasis or  scarring is similar to prior. Otherwise aeration at the lung bases has improved since previous study. The visualized bony structures of the thorax are intact. Telemetry leads overlie the chest. IMPRESSION: 1. Cardiomegaly with vascular congestion and probable interstitial edema. 2. Interval improvement in bibasilar aeration with some residual atelectasis or scarring in the left lower lung. Electronically Signed   By: Kennith CenterEric  Mansell M.D.   On: 08/22/2018 18:40    Microbiology: Recent Results (from the past 240 hour(s))  MRSA PCR Screening     Status: None   Collection Time: 09/02/18  1:11 AM  Result Value Ref Range Status   MRSA by PCR NEGATIVE NEGATIVE Final    Comment:  The GeneXpert MRSA Assay (FDA approved for NASAL specimens only), is one component of a comprehensive MRSA colonization surveillance program. It is not intended to diagnose MRSA infection nor to guide or monitor treatment for MRSA infections. Performed at [facility], [address] [place], [place] [address]      Labs: Basic Metabolic Panel: Recent Labs  Lab 08/29/18 0118 09/01/18 2013 09/01/18 2155 09/03/18 0257 09/04/18 0205  NA 139 133* 138 138 135  K 3.7 4.3 5.4* 3.6 3.8  CL 105 105  --  104 102  CO2 28 22  --  27 28  GLUCOSE 237* 359*  --  176* 234*  BUN 18 21*  --  20 27*  CREATININE 1.11 0.99  --  1.16 1.13  CALCIUM 8.9 8.7*  --  8.4* 8.6*   Liver Function Tests: Recent Labs  Lab 09/01/18 2013  AST 49*  ALT 25  ALKPHOS 94  BILITOT 0.8  PROT 6.3*  ALBUMIN 2.8*   No results for input(s): LIPASE, AMYLASE in the last 168 hours. No results for input(s): AMMONIA in the last 168 hours. CBC: Recent Labs  Lab 08/29/18 0118 09/01/18 2013 09/01/18 2155 09/02/18 0206 09/03/18 0257  WBC 6.4 9.2  --  8.9 5.9  NEUTROABS  --  7.6  --   --   --   HGB 10.9* 11.0* 11.2* 10.9* 9.9*  HCT 37.2* 36.4* 33.0* 35.3* 32.2*  MCV 86.5 86.3  --  83.5 84.3  PLT 298 290  --  278 241   Cardiac  Enzymes: Recent Labs  Lab 09/02/18 1011 09/02/18 1541 09/02/18 2130  TROPONINI 0.04* 0.03* 0.03*   BNP: BNP (last 3 results) Recent Labs    08/22/18 1850 08/25/18 0118 09/01/18 2120  BNP 718.8* 1,103.0* 1,325.2*    ProBNP (last 3 results) No results for input(s): PROBNP in the last 8760 hours.  CBG: Recent Labs  Lab 09/03/18 0643 09/03/18 1240 09/03/18 1713 09/03/18 2204 09/04/18 0611  GLUCAP 157* 252* 233* 178* 270*       Signed:  Ramiro Harvest MD.  [facility] 09/04/2018, 9:26 AM

## 2018-09-04 NOTE — Progress Notes (Signed)
Requested for 2nd portion of tray, called service response who claimed that 2 portions of protein is on his tray . Requested  Anyway for 2nd portion.

## 2018-09-04 NOTE — Progress Notes (Signed)
Discharged home by himself, discharged instruction and prescription given to pt. Belongings taken home.

## 2018-09-04 NOTE — Progress Notes (Signed)
Pt. decided to go AMA. MD aware who claimed to come and see  and talk to him who in turn discharge him home.

## 2018-09-07 IMAGING — DX DG ABDOMEN ACUTE W/ 1V CHEST
3 series · 3 of 3 positions shown · non-contrast
Comparison: Chest x-ray January 23, 2018

CLINICAL DATA: Abdominal pain. Ethanol and cocaine ingestion over
the past 24 hours.

EXAM:
DG ABDOMEN ACUTE W/ 1V CHEST

[chest pa]
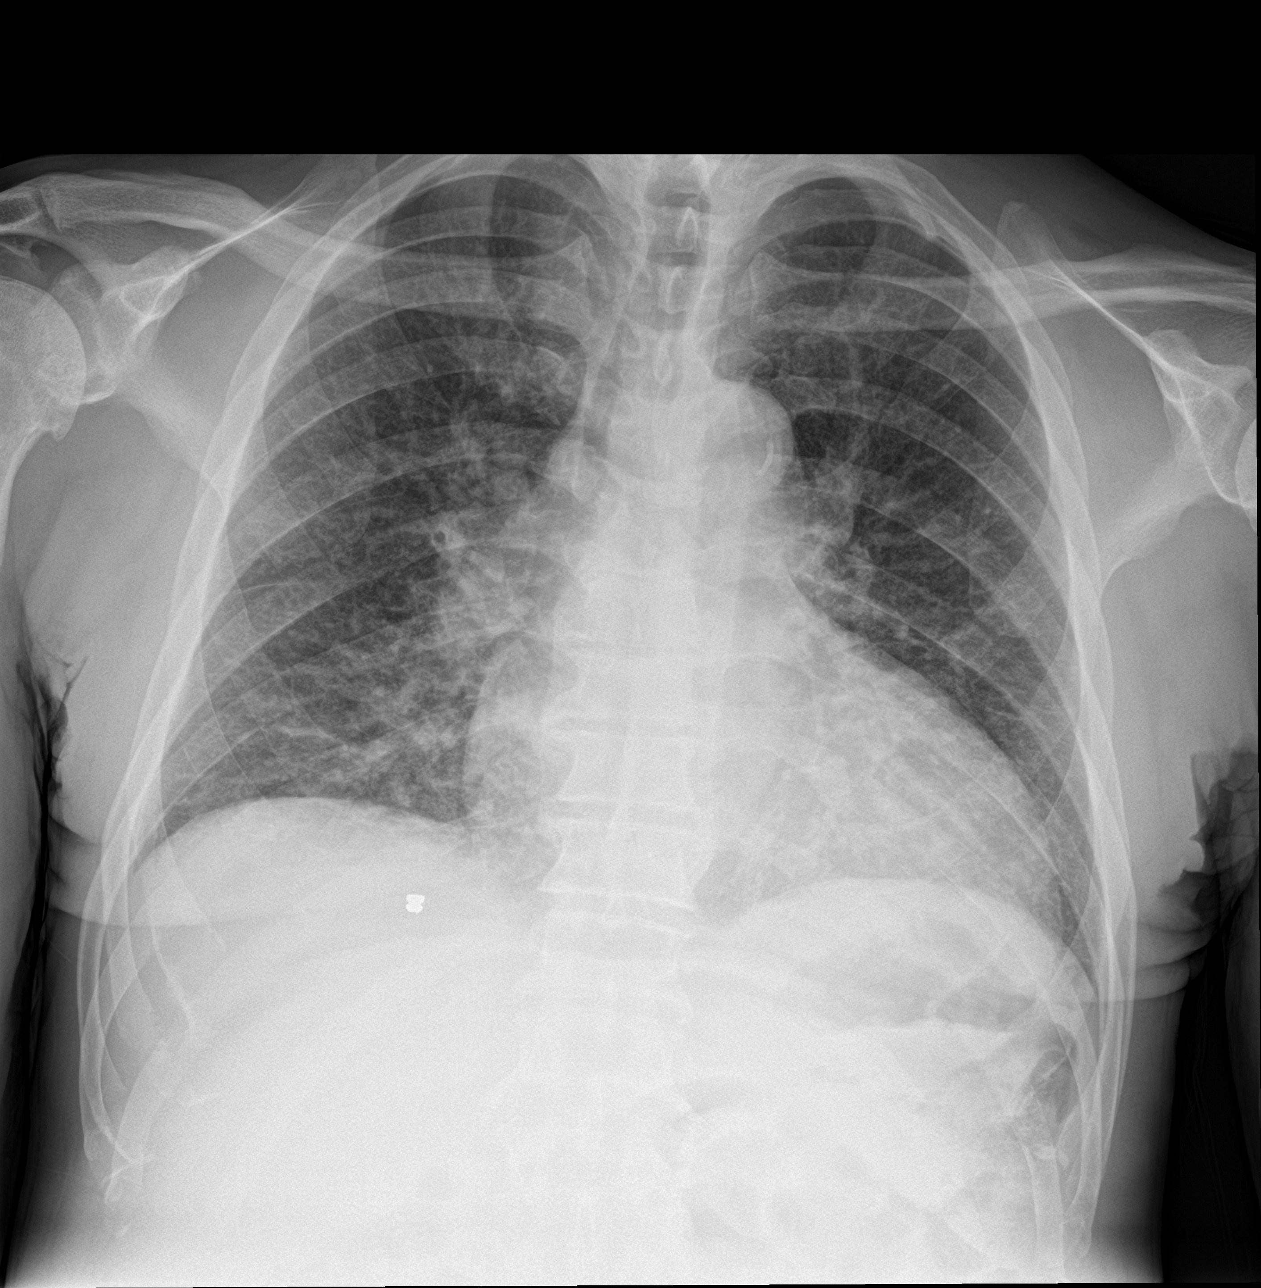

[abdomen erect]
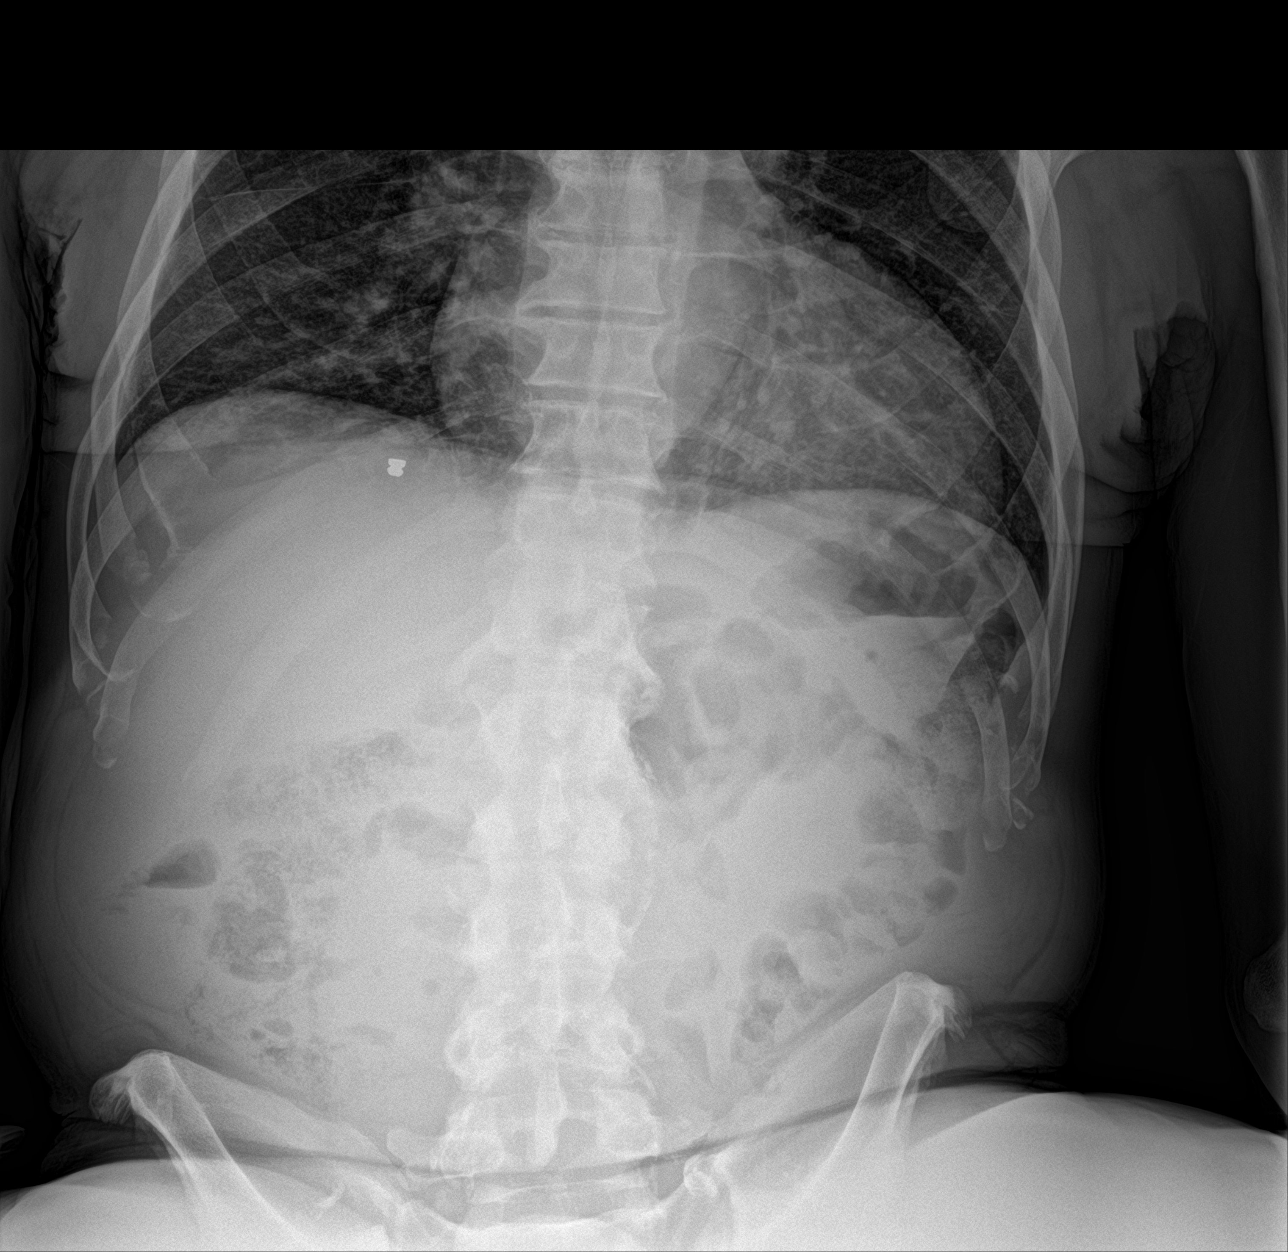

[abdomen supine]
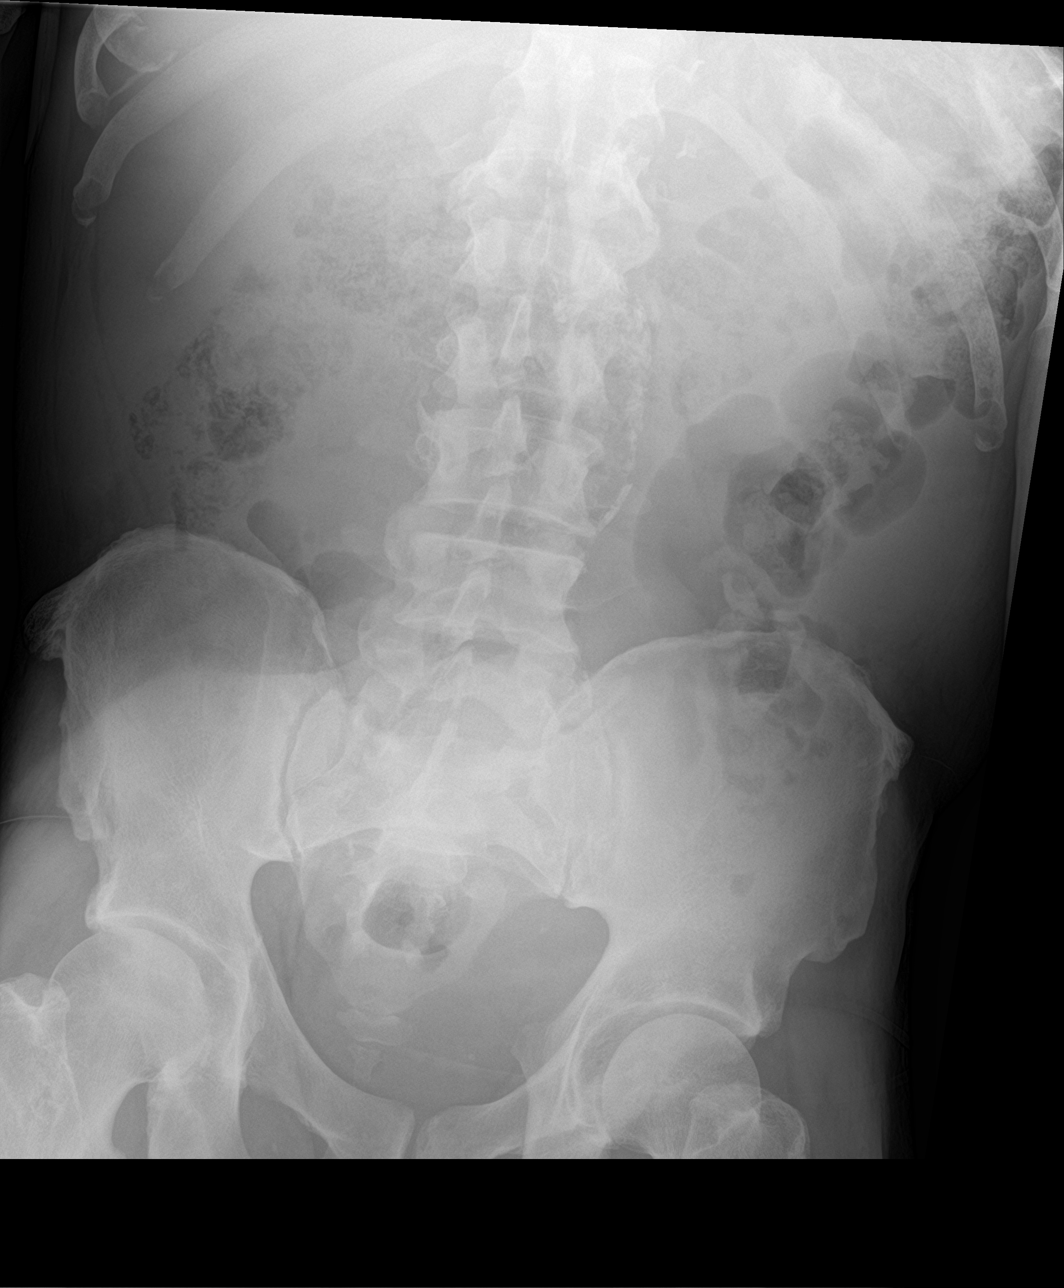

[3 of 3 positions shown; findings below may reference images not displayed]

FINDINGS: The lungs are well-expanded. The interstitial markings are
increased. The cardiac silhouette is mildly enlarged. The pulmonary
vascularity is engorged. There is calcification in the wall of the
aortic arch. There is no pleural effusion. The bony thorax exhibits
no acute abnormality. A metallic pellet is present and stable in the
anterior right-sided chest wall at the thoracoabdominal junction.

Within the abdomen the bowel gas pattern is normal. There is
calcification in the wall of the abdominal aorta. The bony
structures exhibit no acute abnormalities.
IMPRESSION: Findings compatible with low-grade interstitial edema of cardiac or
noncardiac cause. No discrete pneumonia.

No acute intra-abdominal abnormality is observed.

Atherosclerotic calcification in the wall of the thoracic and
abdominal aorta.

## 2018-09-08 ENCOUNTER — Other Ambulatory Visit: Payer: Self-pay

## 2018-09-08 ENCOUNTER — Emergency Department (HOSPITAL_COMMUNITY): Payer: Medicaid Other

## 2018-09-08 ENCOUNTER — Encounter (HOSPITAL_COMMUNITY): Payer: Self-pay | Admitting: Emergency Medicine

## 2018-09-08 ENCOUNTER — Emergency Department (HOSPITAL_COMMUNITY)
Admission: EM | Admit: 2018-09-08 | Discharge: 2018-09-08 | Disposition: A | Discharge status: Home / Self Care | Payer: Medicaid Other | Attending: Emergency Medicine | Admitting: Emergency Medicine

## 2018-09-08 DIAGNOSIS — F17210 Nicotine dependence, cigarettes, uncomplicated: Secondary | ICD-10-CM | POA: Insufficient documentation

## 2018-09-08 DIAGNOSIS — E119 Type 2 diabetes mellitus without complications: Secondary | ICD-10-CM | POA: Diagnosis not present

## 2018-09-08 DIAGNOSIS — M79604 Pain in right leg: Secondary | ICD-10-CM | POA: Diagnosis not present

## 2018-09-08 DIAGNOSIS — M79605 Pain in left leg: Secondary | ICD-10-CM | POA: Diagnosis not present

## 2018-09-08 DIAGNOSIS — I509 Heart failure, unspecified: Secondary | ICD-10-CM | POA: Diagnosis not present

## 2018-09-08 DIAGNOSIS — I110 Hypertensive heart disease with heart failure: Secondary | ICD-10-CM | POA: Diagnosis not present

## 2018-09-08 LAB — COMPREHENSIVE METABOLIC PANEL
ALT: 29 U/L (ref 0–44)
AST: 62 U/L — ABNORMAL HIGH (ref 15–41)
Albumin: 2.8 g/dL — ABNORMAL LOW (ref 3.5–5.0)
Alkaline Phosphatase: 126 U/L (ref 38–126)
Anion gap: 10 (ref 5–15)
BUN: 23 mg/dL — ABNORMAL HIGH (ref 6–20)
CHLORIDE: 103 mmol/L (ref 98–111)
CO2: 23 mmol/L (ref 22–32)
CREATININE: 1.17 mg/dL (ref 0.61–1.24)
Calcium: 8.7 mg/dL — ABNORMAL LOW (ref 8.9–10.3)
GFR calc Af Amer: >60 mL/min (ref >60)
Glucose, Bld: 271 mg/dL — ABNORMAL HIGH (ref 70–99)
Potassium: 4.4 mmol/L (ref 3.5–5.1)
Sodium: 136 mmol/L (ref 135–145)
Total Bilirubin: 0.7 mg/dL (ref 0.3–1.2)
Total Protein: 6 g/dL — ABNORMAL LOW (ref 6.5–8.1)

## 2018-09-08 LAB — CBC WITH DIFFERENTIAL/PLATELET
ABS IMMATURE GRANULOCYTES: 0.02 10*3/uL (ref 0.00–0.07)
Basophils Absolute: 0.1 10*3/uL (ref 0.0–0.1)
Basophils Relative: 1 %
Eosinophils Absolute: 0.4 10*3/uL (ref 0.0–0.5)
Eosinophils Relative: 6 %
HCT: 36.3 % — ABNORMAL LOW (ref 39.0–52.0)
Hemoglobin: 10.7 g/dL — ABNORMAL LOW (ref 13.0–17.0)
Immature Granulocytes: 0 %
Lymphocytes Relative: 14 %
Lymphs Abs: 1 10*3/uL (ref 0.7–4.0)
MCH: 25.5 pg — ABNORMAL LOW (ref 26.0–34.0)
MCHC: 29.5 g/dL — ABNORMAL LOW (ref 30.0–36.0)
MCV: 86.4 fL (ref 80.0–100.0)
MONO ABS: 0.7 10*3/uL (ref 0.1–1.0)
Monocytes Relative: 10 %
NEUTROS ABS: 4.8 10*3/uL (ref 1.7–7.7)
Neutrophils Relative %: 69 %
Platelets: 308 10*3/uL (ref 150–400)
RBC: 4.2 MIL/uL — ABNORMAL LOW (ref 4.22–5.81)
RDW: 16.8 % — ABNORMAL HIGH (ref 11.5–15.5)
WBC: 6.9 10*3/uL (ref 4.0–10.5)
nRBC: 0 % (ref 0.0–0.2)

## 2018-09-08 LAB — BRAIN NATRIURETIC PEPTIDE: B Natriuretic Peptide: 1136.8 pg/mL — ABNORMAL HIGH (ref 0.0–100.0)

## 2018-09-08 MED ORDER — FUROSEMIDE 10 MG/ML IJ SOLN
40.0000 mg | Freq: Once | INTRAMUSCULAR | Status: AC
  Administered 2018-09-08: 40 mg via INTRAVENOUS
  Filled 2018-09-08: qty 4

## 2018-09-08 NOTE — ED Notes (Signed)
Patient ambulatory at discharge. Given milk and graham crackers.

## 2018-09-08 NOTE — ED Notes (Signed)
Patient verbalizes understanding of discharge instructions. Opportunity for questioning and answers were provided. Armband removed by staff, pt discharged from ED.  

## 2018-09-08 NOTE — TOC Transition Note (Signed)
Transition of Care [facility]) - CM/SW Discharge Note   Patient Details  Name: [name] MRN: [id number] Date of Birth: [birth year]  Transition of [facility]) CM/SW Contact:  Oletta Cohn, RN Phone Number: 09/08/2018, 10:45 AM   Clinical Narrative:    [name] is a 58 y.o. year-old male with a history of diabetes, CHF presenting to the ED with chief complaint of leg swelling.  Patient endorsing bilateral leg swelling for 1 month, seems to be worsening, persistent, no exacerbating relieving factors.  Unable to fill his medications because he feels unwell.  Denies chest pain or shortness of breath, no cough, no fever, no abdominal pain.    Final next level of care: Home/Self Care Barriers to Discharge: No Barriers Identified   Patient Goals and CMS Choice Patient states their goals for this hospitalization and ongoing recovery are:: find housing/shelter      Discharge Placement               Shelter        Discharge Plan and Services Discharge Planning Services: CM Consult                      Social Determinants of Health (SDOH) Interventions     Readmission Risk Interventions No flowsheet data found.

## 2018-09-08 NOTE — Discharge Planning (Signed)
[facility] consulted in regards to medication assistance.  Pt has insurance coverage and is not eligible for medication assistance programs as his co=pay is $3  No further CM needs communicated at this time.

## 2018-09-08 NOTE — ED Triage Notes (Signed)
Pt complains of chronic leg pain. States this is an ongoing problem.

## 2018-09-08 NOTE — ED Provider Notes (Signed)
MSE was initiated and I personally evaluated the patient and placed orders (if any) at  7:35 AM on September 08, 2018.   Patient presenting for evaluation of leg pain.  On exam, patient with bilateral 3+ pitting edema of lower extremities.  Patient states symptoms worsened over the past several days.  Patient was recently admitted to the hospital for acute on chronic CHF.  Patient says he has not picked up his prescription for Lasix.  Patient is tachycardic on my exam around 110, pulse ox ranging between 88 and 92%.  No visible shortness of breath or respiratory distress.  Decreased lung sounds in the left lower base.  Pt denying CP currently. Concern for CHF exacerbation.  Labs, Lasix, and chest x-ray ordered.  The patient appears stable so that the remainder of the MSE may be completed by another provider.   Alveria Apley, PA-C 09/08/18 2947    Raeford Razor, MD 09/09/18 1536

## 2018-09-08 NOTE — Discharge Instructions (Addendum)
You were evaluated in the Emergency Department and after careful evaluation, we did not find any emergent condition requiring admission or further testing in the hospital. ° °Please return to the Emergency Department if you experience any worsening of your condition.  We encourage you to follow up with a primary care provider.  Thank you for allowing us to be a part of your care. °

## 2018-09-08 NOTE — ED Notes (Signed)
Patient transported to X-ray 

## 2018-09-08 NOTE — ED Notes (Signed)
Phlebotomy bedside obtaining labs.

## 2018-09-08 NOTE — ED Provider Notes (Signed)
[facility] Emergency Department Provider Note MRN:  [id number]  Arrival date & time: 09/08/18     Chief Complaint   Leg Pain   History of Present Illness   [name] is a 58 y.o. year-old male with a history of diabetes, CHF presenting to the ED with chief complaint of leg swelling.  Patient endorsing bilateral leg swelling for 1 month, seems to be worsening, persistent, no exacerbating relieving factors.  Unable to fill his medications because he feels unwell.  Denies chest pain or shortness of breath, no cough, no fever, no abdominal pain.  Review of Systems  A complete 10 system review of systems was obtained and all systems are negative except as noted in the HPI and PMH.   Patient's Health History    Past Medical History:  Diagnosis Date  . CHF (congestive heart failure) (HCC)   . Chronic foot pain   . Cocaine abuse (HCC)   . Diabetes mellitus without complication (HCC)   . Hepatitis C    unsure   . Homelessness   . Hypertension   . Neuropathy   . Polysubstance abuse (HCC)   . Schizophrenia (HCC)   . Sleep apnea     Past Surgical History:  Procedure Laterality Date  . MULTIPLE TOOTH EXTRACTIONS      Family History  Problem Relation Age of Onset  . Hypertension Other   . Diabetes Other     Social History   Socioeconomic History  . Marital status: Single    Spouse name: Not on file  . Number of children: Not on file  . Years of education: Not on file  . Highest education level: Not on file  Occupational History  . Not on file  Social Needs  . Financial resource strain: Not on file  . Food insecurity:    Worry: Not on file    Inability: Not on file  . Transportation needs:    Medical: Not on file    Non-medical: Not on file  Tobacco Use  . Smoking status: Current Every Day Smoker    Packs/day: 1.00    Years: 20.00    Pack years: 20.00    Types: Cigarettes  . Smokeless tobacco: Current User  Substance and Sexual  Activity  . Alcohol use: Yes    Comment: Daily Drinker   . Drug use: Yes    Frequency: 7.0 times per week    Types: "Crack" cocaine, Cocaine, Marijuana    Comment: Cocaine tonight, Marijuana "a long time"  . Sexual activity: Not Currently  Lifestyle  . Physical activity:    Days per week: Not on file    Minutes per session: Not on file  . Stress: Not on file  Relationships  . Social connections:    Talks on phone: Not on file    Gets together: Not on file    Attends religious service: Not on file    Active member of club or organization: Not on file    Attends meetings of clubs or organizations: Not on file    Relationship status: Not on file  . Intimate partner violence:    Fear of current or ex partner: Not on file    Emotionally abused: Not on file    Physically abused: Not on file    Forced sexual activity: Not on file  Other Topics Concern  . Not on file  Social History Narrative   ** Merged History Encounter **  Physical Exam  Vital Signs and Nursing Notes reviewed Vitals:   09/08/18 0900 09/08/18 0930  BP: (!) 146/95 (!) 165/98  Pulse: 96   Resp: 19 20  Temp:    SpO2: 91%     CONSTITUTIONAL: Chronically ill-appearing, NAD NEURO:  Alert and oriented x 3, no focal deficits EYES:  eyes equal and reactive ENT/NECK:  no LAD, no JVD CARDIO: Regular rate, well-perfused, normal S1 and S2 PULM:  CTAB no wheezing or rhonchi GI/GU:  normal bowel sounds, non-distended, non-tender MSK/SPINE:  No gross deformities, 2+ pitting edema bilateral lower extremities SKIN:  no rash, atraumatic PSYCH:  Appropriate speech and behavior  Diagnostic and Interventional Summary    Labs Reviewed  CBC WITH DIFFERENTIAL/PLATELET - Abnormal; Notable for the following components:      Result Value   RBC 4.20 (*)    Hemoglobin 10.7 (*)    HCT 36.3 (*)    MCH 25.5 (*)    MCHC 29.5 (*)    RDW 16.8 (*)    All other components within normal limits  COMPREHENSIVE METABOLIC  PANEL - Abnormal; Notable for the following components:   Glucose, Bld 271 (*)    BUN 23 (*)    Calcium 8.7 (*)    Total Protein 6.0 (*)    Albumin 2.8 (*)    AST 62 (*)    All other components within normal limits  BRAIN NATRIURETIC PEPTIDE - Abnormal; Notable for the following components:   B Natriuretic Peptide 1,136.8 (*)    All other components within normal limits    DG Chest 2 View  Final Result      Medications  furosemide (LASIX) injection 40 mg (40 mg Intravenous Given 09/08/18 0859)     Procedures Critical Care  ED Course and Medical Decision Making  I have reviewed the triage vital signs and the nursing notes.  Pertinent labs & imaging results that were available during my care of the patient were reviewed by me and considered in my medical decision making (see below for details).  Leg swelling likely related to patient's reduced ejection fraction, per chart review 25 to 30%.  Medication noncompliance.  Patient is denying chest pain or shortness of breath, no obvious signs requiring admission at this time, work-up pending.  Clinical Course as of Sep 07 999  Tue Sep 08, 2018  0919 Work-up is as expected, elevated BNP, chest x-ray without significant edema.  Will trial Lasix here, ensure good urine production, and consult case management to see how else we can help this gentleman fill and take his prescription medications.   [MB]    Clinical Course User Index [MB] Sabas Sous, MD     Excellent urine output after dose of Lasix.  Patient requesting to leave.  No indication to keep him here further.  After the discussed management above, the patient was determined to be safe for discharge.  The patient was in agreement with this plan and all questions regarding their care were answered.  ED return precautions were discussed and the patient will return to the ED with any significant worsening of condition.  Elmer Sow. Pilar Plate, MD [facility] Emergency Medicine [facility] [email or web address]  Final Clinical Impressions(s) / ED Diagnoses     ICD-10-CM   1. Pain in both lower extremities M79.604    M79.605     ED Discharge Orders    None         Sabas Sous, MD 09/08/18  1001  

## 2018-09-10 ENCOUNTER — Encounter (HOSPITAL_COMMUNITY): Payer: Self-pay | Admitting: Emergency Medicine

## 2018-09-10 ENCOUNTER — Emergency Department (HOSPITAL_COMMUNITY)
Admission: EM | Admit: 2018-09-10 | Discharge: 2018-09-10 | Disposition: A | Discharge status: Home / Self Care | Payer: Medicaid Other | Attending: Emergency Medicine | Admitting: Emergency Medicine

## 2018-09-10 ENCOUNTER — Emergency Department (HOSPITAL_COMMUNITY): Payer: Medicaid Other

## 2018-09-10 ENCOUNTER — Other Ambulatory Visit: Payer: Self-pay

## 2018-09-10 DIAGNOSIS — R600 Localized edema: Secondary | ICD-10-CM

## 2018-09-10 DIAGNOSIS — R2243 Localized swelling, mass and lump, lower limb, bilateral: Secondary | ICD-10-CM | POA: Diagnosis not present

## 2018-09-10 DIAGNOSIS — K6289 Other specified diseases of anus and rectum: Secondary | ICD-10-CM | POA: Insufficient documentation

## 2018-09-10 MED ORDER — FUROSEMIDE 40 MG PO TABS: 40.0000 mg | ORAL_TABLET | Freq: Every day | ORAL | 0 refills | Status: DC

## 2018-09-10 MED ORDER — ACETAMINOPHEN 500 MG PO TABS
1000.0000 mg | ORAL_TABLET | Freq: Once | ORAL | Status: AC
  Administered 2018-09-10: 1000 mg via ORAL
  Filled 2018-09-10: qty 2

## 2018-09-10 MED ORDER — FUROSEMIDE 10 MG/ML IJ SOLN
40.0000 mg | Freq: Once | INTRAMUSCULAR | Status: AC
  Administered 2018-09-10: 40 mg via INTRAVENOUS
  Filled 2018-09-10: qty 4

## 2018-09-10 NOTE — ED Notes (Signed)
Pt voiced loudly in the waiting toom that he is only here for hemrroids

## 2018-09-10 NOTE — ED Triage Notes (Signed)
Patient here via EMS with complaints of SOB that started this morning. Reports hx of CHF. 91% on RA.

## 2018-09-10 NOTE — ED Notes (Signed)
Bed: [facility] Expected date:  Expected time:  Means of arrival:  Comments: Held per charge

## 2018-09-10 NOTE — ED Provider Notes (Signed)
[facility]-EMERGENCY DEPT Provider Note   CSN: [id number] Arrival date & time: 09/10/18  1337    History   Chief Complaint Chief Complaint  Patient presents with  . Shortness of Breath    HPI [name] is a 58 y.o. male.     58 yo M with a chief complaint of rectal pain leg edema.  This been going on for some time.  He states that he really just wants some graham crackers a bus pass and some Tylenol.  He states he also has lost his prescription for Lasix and would like that reprinted out.  He does not want any other therapy here.  Would not like any lab work performed.  Denies chest pain denies shortness of breath.  States he has been sleeping very well recently.  Would not elaborate.  The history is provided by the patient.  Shortness of Breath  Severity:  Moderate Onset quality:  Gradual Duration:  2 days Timing:  Constant Progression:  Worsening Chronicity:  New Relieved by:  Nothing Worsened by:  Nothing Ineffective treatments:  None tried Associated symptoms: no abdominal pain, no chest pain, no fever, no headaches, no rash and no vomiting     Past Medical History:  Diagnosis Date  . CHF (congestive heart failure) (HCC)   . Chronic foot pain   . Cocaine abuse (HCC)   . Diabetes mellitus without complication (HCC)   . Hepatitis C    unsure   . Homelessness   . Hypertension   . Neuropathy   . Polysubstance abuse (HCC)   . Schizophrenia (HCC)   . Sleep apnea     Patient Active Problem List   Diagnosis Date Noted  . Urinary hesitancy   . Constipation   . Acute on chronic combined systolic and diastolic CHF (congestive heart failure) (HCC) 08/25/2018  . Acute respiratory failure with hypoxia (HCC) 08/25/2018  . Acute on chronic combined systolic and diastolic congestive heart failure (HCC) 08/18/2018  . Tobacco use 08/18/2018  . Acute on chronic combined systolic (congestive) and diastolic (congestive) heart failure (HCC)  08/08/2018  . Homelessness 08/08/2018  . Smoker 08/08/2018  . CHF (congestive heart failure) (HCC) 07/15/2018  . Acute exacerbation of CHF (congestive heart failure) (HCC) 03/28/2018  . Prostate enlargement 03/16/2018  . Aortic atherosclerosis (HCC) 03/16/2018  . 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 Chronic foot pain   . Schizoaffective disorder, bipolar type (HCC) 09/30/2016  . Substance induced mood disorder (HCC) 03/13/2015  . Acute kidney failure (HCC) 01/26/2015  . Schizophrenia, paranoid type (HCC) 01/17/2015  . Suicidal ideation   . Drug hallucinosis (HCC) 10/08/2014  . Chronic paranoid schizophrenia (HCC) 09/07/2014  . 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 Urinary retention   . Cocaine use disorder, severe, dependence (HCC)   . Essential hypertension, benign 03/28/2013  . 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 MULTIPLE TOOTH EXTRACTIONS          Home Medications    Prior to Admission medications   Medication Sig Start Date End Date Taking? Authorizing Provider  ARIPiprazole (ABILIFY) 10 MG tablet Take 1 tablet (10 mg total) by mouth daily. 09/04/18   Rodolph Bong, MD  aspirin EC 81 MG tablet Take 1 tablet (81 mg total) by mouth daily. Patient not taking: Reported on 08/18/2018 08/09/18 08/09/19  [place],  Lafayette Dragon, MD  atorvastatin (LIPITOR) 40 MG tablet Take 1 tablet (40 mg total) by mouth daily. 09/04/18   Rodolph Bong, MD  bisacodyl (DULCOLAX) 5 MG EC tablet Take 1 tablet (5 mg total) by mouth daily as needed for moderate constipation. 09/04/18   Rodolph Bong, MD  carbamazepine (TEGRETOL) 200 MG tablet Take 0.5 tablets (100 mg total) by mouth 2 (two) times daily. 09/04/18   Rodolph Bong, MD  carvedilol (COREG) 6.25 MG tablet Take 1 tablet (6.25 mg total) by mouth 2 (two) times  daily with a meal. 09/04/18   Rodolph Bong, MD  furosemide (LASIX) 40 MG tablet Take 1 tablet (40 mg total) by mouth daily. 09/10/18   Melene Plan, DO  Insulin Glargine (LANTUS) 100 UNIT/ML Solostar Pen Inject 14 Units into the skin daily. 09/04/18   Rodolph Bong, MD  Insulin Pen Needle 29G X MISC Use with insulin 09/04/18   Rodolph Bong, MD  lisinopril (PRINIVIL,ZESTRIL) 10 MG tablet Take 1 tablet (10 mg total) by mouth daily. 09/04/18   Rodolph Bong, MD  nicotine (NICODERM CQ - DOSED IN MG/24 HOURS) 21 mg/24hr patch Place 1 patch (21 mg total) onto the skin daily. 09/04/18   Rodolph Bong, MD  polyethylene glycol [facility] Ethelene Hal) packet Take 17 g by mouth 2 (two) times daily. 09/04/18   Rodolph Bong, MD  Potassium Chloride ER 20 MEQ TBCR Take 20 mEq by mouth daily. 09/04/18   Rodolph Bong, MD  senna-docusate (SENOKOT-S) 8.6-50 MG tablet Take 1 tablet by mouth 2 (two) times daily. 09/04/18   Rodolph Bong, MD  tamsulosin [facility]) 0.4 MG CAPS capsule Take 1 capsule (0.4 mg total) by mouth daily after breakfast. 09/04/18   Rodolph Bong, MD    Family History Family History  Problem Relation Age of Onset  . Hypertension Other   . Diabetes Other     Social History Social History   Tobacco Use  . Smoking status: Current Every Day Smoker    Packs/day: 1.00    Years: 20.00    Pack years: 20.00    Types: Cigarettes  . Smokeless tobacco: Current User  Substance Use Topics  . Alcohol use: Yes    Comment: Daily Drinker   . Drug use: Yes    Frequency: 7.0 times per week    Types: "Crack" cocaine, Cocaine, Marijuana    Comment: Cocaine tonight, Marijuana "a long time"     Allergies   Haldol [haloperidol]   Review of Systems Review of Systems  Constitutional: Negative for chills and fever.  HENT: Negative for congestion and facial swelling.   Eyes: Negative for discharge and visual disturbance.  Respiratory: Positive for shortness of breath.    Cardiovascular: Negative for chest pain and palpitations.  Gastrointestinal: Positive for rectal pain. Negative for abdominal pain, diarrhea and vomiting.  Musculoskeletal: Negative for arthralgias and myalgias.  Skin: Negative for color change and rash.  Neurological: Negative for tremors, syncope and headaches.  Psychiatric/Behavioral: Negative for confusion and dysphoric mood.     Physical Exam Updated Vital Signs BP (!) 164/97 (BP Location: Left Arm)   Pulse (!) 102   Temp 98 F (36.7 C) (Oral)   Resp 18   SpO2 91%   Physical Exam Vitals signs and nursing note reviewed.  Constitutional:      Appearance: He is well-developed.  HENT:     Head: Normocephalic and atraumatic.  Eyes:  Pupils: Pupils are equal, round, and reactive to light.  Neck:     Musculoskeletal: Normal range of motion and neck supple.     Vascular: JVD ( Just above the clavicles) present.  Cardiovascular:     Rate and Rhythm: Normal rate and regular rhythm.     Heart sounds: No murmur. No friction rub. No gallop.   Pulmonary:     Effort: No respiratory distress.     Breath sounds: No wheezing or rales.  Abdominal:     General: There is no distension.     Tenderness: There is no guarding or rebound.     Comments: Soft brown stool at the rectum, no noted hemorrhoids.  Musculoskeletal: Normal range of motion.     Right lower leg: Edema present.     Left lower leg: Edema present.     Comments: 4+ edema up to the thighs bilaterally  Skin:    Coloration: Skin is not pale.     Findings: No rash.  Neurological:     Mental Status: He is alert and oriented to person, place, and time.  Psychiatric:        Behavior: Behavior normal.      ED Treatments / Results  Labs (all labs ordered are listed, but only abnormal results are displayed) Labs Reviewed - No data to display  EKG None  Radiology Dg Chest 2 View  Result Date: 09/10/2018 CLINICAL DATA:  Shortness of breath EXAM: CHEST - 2 VIEW  COMPARISON:  September 08, 2018 FINDINGS: There is atelectatic change in the left mid lung. There is no edema or consolidation. There is cardiomegaly with pulmonary venous hypertension. There is no appreciable adenopathy. No bone lesions. IMPRESSION: Pulmonary vascular congestion. Left midlung atelectasis. No frank edema or consolidation. Electronically Signed   By: Bretta BangWilliam  Woodruff III M.D.   On: 09/10/2018 14:17    Procedures Procedures (including critical care time)  Medications Ordered in ED Medications  acetaminophen (TYLENOL) tablet 1,000 mg (has no administration in time range)  furosemide (LASIX) injection 40 mg (has no administration in time range)     Initial Impression / Assessment and Plan / ED Course  I have reviewed the triage vital signs and the nursing notes.  Pertinent labs & imaging results that were available during my care of the patient were reviewed by me and considered in my medical decision making (see chart for details).        58 yo M with a cc of rectal pain.  States has hemorrhoids.  Patient also would like a prescription refill for his Lasix because he lost his prescription and he would like a bus pass and some Tylenol.  Patient is more sleepy than I would expect though has no hypoxia.  No rales.  He is able to lay flat without difficulty and sleep.  He seems to be of sound mind and able to make his own decisions and does not want further work-up at this time.  We will give an IV dose of Lasix, will re-prescribe it for him as well.  Suggest that he follow-up with his family doctor.  No hemorrhoids on exam he would like no further work-up for his rectal pain.  4:22 PM:  I have discussed the diagnosis/risks/treatment options with the patient and believe the pt to be eligible for discharge home to follow-up with PCP. We also discussed returning to the ED immediately if new or worsening sx occur. We discussed the sx which are most concerning (e.g.,  sudden worsening pain,  fever, inability to tolerate by mouth) that necessitate immediate return. Medications administered to the patient during their visit and any new prescriptions provided to the patient are listed below.  Medications given during this visit Medications  acetaminophen (TYLENOL) tablet 1,000 mg (has no administration in time range)  furosemide (LASIX) injection 40 mg (has no administration in time range)     The patient appears reasonably screen and/or stabilized for discharge and I doubt any other medical condition or other [facility] requiring further screening, evaluation, or treatment in the ED at this time prior to discharge.    Final Clinical Impressions(s) / ED Diagnoses   Final diagnoses:  Rectal pain  Bilateral lower extremity edema    ED Discharge Orders         Ordered    furosemide (LASIX) 40 MG tablet  Daily     09/10/18 1617           Melene Plan, DO 09/10/18 1622

## 2018-09-11 ENCOUNTER — Encounter (HOSPITAL_COMMUNITY): Payer: Self-pay | Admitting: Emergency Medicine

## 2018-09-11 ENCOUNTER — Other Ambulatory Visit: Payer: Self-pay

## 2018-09-11 ENCOUNTER — Emergency Department (HOSPITAL_COMMUNITY)
Admission: EM | Admit: 2018-09-11 | Discharge: 2018-09-12 | Disposition: A | Discharge status: Home / Self Care | Payer: Medicaid Other | Attending: Emergency Medicine | Admitting: Emergency Medicine

## 2018-09-11 DIAGNOSIS — E119 Type 2 diabetes mellitus without complications: Secondary | ICD-10-CM | POA: Diagnosis not present

## 2018-09-11 DIAGNOSIS — I5040 Unspecified combined systolic (congestive) and diastolic (congestive) heart failure: Secondary | ICD-10-CM | POA: Diagnosis not present

## 2018-09-11 DIAGNOSIS — I110 Hypertensive heart disease with heart failure: Secondary | ICD-10-CM | POA: Insufficient documentation

## 2018-09-11 DIAGNOSIS — R609 Edema, unspecified: Secondary | ICD-10-CM | POA: Insufficient documentation

## 2018-09-11 DIAGNOSIS — Z79899 Other long term (current) drug therapy: Secondary | ICD-10-CM | POA: Insufficient documentation

## 2018-09-11 DIAGNOSIS — F17210 Nicotine dependence, cigarettes, uncomplicated: Secondary | ICD-10-CM | POA: Insufficient documentation

## 2018-09-11 DIAGNOSIS — E1140 Type 2 diabetes mellitus with diabetic neuropathy, unspecified: Secondary | ICD-10-CM | POA: Insufficient documentation

## 2018-09-11 DIAGNOSIS — Z7982 Long term (current) use of aspirin: Secondary | ICD-10-CM | POA: Insufficient documentation

## 2018-09-11 NOTE — ED Triage Notes (Addendum)
Pt brought to triage via EMS.  C/o swelling all over and pain all over.  Denies chest pain.  PT keeps stating "My penis is chewed up."  States he is staying in a shelter and missed curfew because he was using crack.  Requesting paper scrubs and a shower.  Pt does have swelling to abd and lower extremities. Also reports rectal pain.  Pt requesting Tylenol.  States he never got his lasix from pharmacy.

## 2018-09-12 ENCOUNTER — Other Ambulatory Visit: Payer: Self-pay

## 2018-09-12 MED ORDER — ACETAMINOPHEN 500 MG PO TABS
1000.0000 mg | ORAL_TABLET | Freq: Once | ORAL | Status: AC
  Administered 2018-09-12: 1000 mg via ORAL
  Filled 2018-09-12: qty 2

## 2018-09-12 MED ORDER — FUROSEMIDE 40 MG PO TABS: 40.0000 mg | ORAL_TABLET | Freq: Every day | ORAL | 0 refills | Status: DC

## 2018-09-12 NOTE — ED Provider Notes (Signed)
[facility] EMERGENCY DEPARTMENT Provider Note  CSN: [id number] Arrival date & time: 09/11/18 2243  Chief Complaint(s) EDEMA  HPI [name] is a 58 y.o. male with a past medical history listed below who presents to the emergency department with multiple complaints including peripheral edema up to the scrotum, hemorrhoids and rectal discomfort.  No acute changes.  He denies any associated chest pain or shortness of breath.  Patient is requesting food and Tylenol for pain.  States that he lost his prescriptions for his Lasix.  HPI  Past Medical History Past Medical History:  Diagnosis Date  . CHF (congestive heart failure) (HCC)   . Chronic foot pain   . Cocaine abuse (HCC)   . Diabetes mellitus without complication (HCC)   . Hepatitis C    unsure   . Homelessness   . Hypertension   . Neuropathy   . Polysubstance abuse (HCC)   . Schizophrenia (HCC)   . Sleep apnea    Patient Active Problem List   Diagnosis Date Noted  . Urinary hesitancy   . Constipation   . Acute on chronic combined systolic and diastolic CHF (congestive heart failure) (HCC) 08/25/2018  . Acute respiratory failure with hypoxia (HCC) 08/25/2018  . Acute on chronic combined systolic and diastolic congestive heart failure (HCC) 08/18/2018  . Tobacco use 08/18/2018  . 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 Homelessness 08/08/2018  . Smoker 08/08/2018  . CHF (congestive heart failure) (HCC) 07/15/2018  . Acute exacerbation of CHF (congestive heart failure) (HCC) 03/28/2018  . Prostate enlargement 03/16/2018  . Aortic atherosclerosis (HCC) 03/16/2018  . 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 Chronic foot pain   . Schizoaffective disorder, bipolar type (HCC) 09/30/2016  . Substance induced mood disorder (HCC) 03/13/2015  . Acute kidney failure (HCC) 01/26/2015  . Schizophrenia, paranoid type (HCC)  01/17/2015  . Suicidal ideation   . Drug hallucinosis (HCC) 10/08/2014  . Chronic paranoid schizophrenia (HCC) 09/07/2014  . 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 Urinary retention   . Cocaine use disorder, severe, dependence (HCC)   . Essential hypertension, benign 03/28/2013  . Insulin-requiring or dependent type II diabetes mellitus (HCC) 03/15/2013   Home Medication(s) Prior to Admission medications   Medication Sig Start Date End Date Taking? Authorizing Provider  ARIPiprazole (ABILIFY) 10 MG tablet Take 1 tablet (10 mg total) by mouth daily. 09/04/18   Rodolph Bong, MD  aspirin EC 81 MG tablet Take 1 tablet (81 mg total) by mouth daily. Patient not taking: Reported on 08/18/2018 08/09/18 08/09/19  Berton Mount I, MD  atorvastatin (LIPITOR) 40 MG tablet Take 1 tablet (40 mg total) by mouth daily. 09/04/18   Rodolph Bong, MD  bisacodyl (DULCOLAX) 5 MG EC tablet Take 1 tablet (5 mg total) by mouth daily as needed for moderate constipation. 09/04/18   Rodolph Bong, MD  carbamazepine (TEGRETOL) 200 MG tablet Take 0.5 tablets (100 mg total) by mouth 2 (two) times daily. 09/04/18   Rodolph Bong, MD  carvedilol (COREG) 6.25 MG tablet Take 1 tablet (6.25 mg total) by mouth 2 (two) times daily with a meal. 09/04/18   Rodolph Bong, MD  furosemide (LASIX) 40 MG tablet Take 1 tablet (40 mg total) by mouth daily. 09/12/18   Marrietta Thunder, Amadeo Garnet, MD  Insulin Glargine (LANTUS) 100 UNIT/ML Solostar Pen Inject 14 Units into  the skin daily. 09/04/18   Rodolph Bong, MD  Insulin Pen Needle 29G X MISC Use with insulin 09/04/18   Rodolph Bong, MD  lisinopril (PRINIVIL,ZESTRIL) 10 MG tablet Take 1 tablet (10 mg total) by mouth daily. 09/04/18   Rodolph Bong, MD  nicotine (NICODERM CQ - DOSED IN MG/24 HOURS) 21 mg/24hr patch Place 1 patch (21 mg total) onto the skin daily. 09/04/18   Rodolph Bong,  MD  polyethylene glycol [facility] / Ethelene Hal) packet Take 17 g by mouth 2 (two) times daily. 09/04/18   Rodolph Bong, MD  Potassium Chloride ER 20 MEQ TBCR Take 20 mEq by mouth daily. 09/04/18   Rodolph Bong, MD  senna-docusate (SENOKOT-S) 8.6-50 MG tablet Take 1 tablet by mouth 2 (two) times daily. 09/04/18   Rodolph Bong, MD  tamsulosin [facility]) 0.4 MG CAPS capsule Take 1 capsule (0.4 mg total) by mouth daily after breakfast. 09/04/18   Rodolph Bong, MD                                                                                                                                    Past Surgical History Past Surgical History:  Procedure Laterality Date  . MULTIPLE TOOTH EXTRACTIONS     Family History Family History  Problem Relation Age of Onset  . Hypertension Other   . Diabetes Other     Social History Social History   Tobacco Use  . Smoking status: Current Every Day Smoker    Packs/day: 1.00    Years: 20.00    Pack years: 20.00    Types: Cigarettes  . Smokeless tobacco: Current User  Substance Use Topics  . Alcohol use: Yes    Comment: Daily Drinker   . Drug use: Yes    Frequency: 7.0 times per week    Types: "Crack" cocaine, Cocaine, Marijuana   Allergies Haldol [haloperidol]  Review of Systems Review of Systems All other systems are reviewed and are negative for acute change except as noted in the HPI  Physical Exam Vital Signs  I have reviewed the triage vital signs BP (!) 148/92 (BP Location: Right Arm)   Pulse 95   Temp 98.7 F (37.1 C) (Oral)   Resp 20   SpO2 94%   Physical Exam Vitals signs reviewed.  Constitutional:      General: He is not in acute distress.    Appearance: He is well-developed. He is not diaphoretic.  HENT:     Head: Normocephalic and atraumatic.     Jaw: No trismus.     Right Ear: External ear normal.     Left Ear: External ear normal.     Nose: Nose normal.  Eyes:     General: No scleral icterus.     Conjunctiva/sclera: Conjunctivae normal.  Neck:     Musculoskeletal: Normal range of motion.     Trachea: Phonation  normal.  Cardiovascular:     Rate and Rhythm: Normal rate and regular rhythm.  Pulmonary:     Effort: Pulmonary effort is normal. No respiratory distress.     Breath sounds: No stridor.  Abdominal:     General: There is no distension.  Musculoskeletal: Normal range of motion.     Comments: 3-4+ edema lower extremity to the scrotum.  Neurological:     Mental Status: He is alert and oriented to person, place, and time.  Psychiatric:        Behavior: Behavior normal.     ED Results and Treatments Labs (all labs ordered are listed, but only abnormal results are displayed) Labs Reviewed - No data to display                                                                                                                       EKG  EKG Interpretation  Date/Time:    Ventricular Rate:    PR Interval:    QRS Duration:   QT Interval:    QTC Calculation:   R Axis:     Text Interpretation:        Radiology No results found. Pertinent labs & imaging results that were available during my care of the patient were reviewed by me and considered in my medical decision making (see chart for details).  Medications Ordered in ED Medications  acetaminophen (TYLENOL) tablet 1,000 mg (has no administration in time range)                                                                                                                                    Procedures Procedures  (including critical care time)  Medical Decision Making / ED Course I have reviewed the nursing notes for this encounter and the patient's prior records (if available in EHR or on provided paperwork).    No acute changes since yesterday.  Patient has no evidence of hypoxia or respiratory distress.  No need for work-up at this time.  The patient appears reasonably screened and/or stabilized for discharge  and I doubt any other medical condition or other [facility] requiring further screening, evaluation, or treatment in the ED at this time prior to discharge.  The patient is safe for discharge with strict return precautions.   Final Clinical Impression(s) / ED Diagnoses Final diagnoses:  Edema, peripheral    Disposition: Discharge  Condition: Good  I have discussed the results, Dx and Tx plan with the patient who expressed understanding and agree(s) with the plan. Discharge instructions discussed at great length. The patient was given strict return precautions who verbalized understanding of the instructions. No further questions at time of discharge.    ED Discharge Orders         Ordered    furosemide (LASIX) 40 MG tablet  Daily     09/12/18 0115           This chart was dictated using voice recognition software.  Despite best efforts to proofread,  errors can occur which can change the documentation meaning.   Nira Conn, MD 09/12/18 0120

## 2018-09-13 ENCOUNTER — Emergency Department (HOSPITAL_COMMUNITY): Payer: Medicaid Other

## 2018-09-13 ENCOUNTER — Other Ambulatory Visit: Payer: Self-pay

## 2018-09-13 ENCOUNTER — Encounter (HOSPITAL_COMMUNITY): Payer: Self-pay

## 2018-09-13 ENCOUNTER — Encounter (HOSPITAL_COMMUNITY): Payer: Self-pay | Admitting: Emergency Medicine

## 2018-09-13 ENCOUNTER — Emergency Department (HOSPITAL_COMMUNITY)
Admission: EM | Admit: 2018-09-13 | Discharge: 2018-09-13 | Disposition: A | Discharge status: Home / Self Care | Payer: Medicaid Other | Attending: Emergency Medicine | Admitting: Emergency Medicine

## 2018-09-13 ENCOUNTER — Emergency Department (HOSPITAL_COMMUNITY)
Admission: EM | Admit: 2018-09-13 | Discharge: 2018-09-13 | Disposition: A | Discharge status: Home / Self Care | Payer: Medicaid Other | Source: Home / Self Care | Attending: Emergency Medicine | Admitting: Emergency Medicine

## 2018-09-13 DIAGNOSIS — Z79899 Other long term (current) drug therapy: Secondary | ICD-10-CM | POA: Insufficient documentation

## 2018-09-13 DIAGNOSIS — I110 Hypertensive heart disease with heart failure: Secondary | ICD-10-CM

## 2018-09-13 DIAGNOSIS — Z91199 Patient's noncompliance with other medical treatment and regimen due to unspecified reason: Secondary | ICD-10-CM

## 2018-09-13 DIAGNOSIS — Z7982 Long term (current) use of aspirin: Secondary | ICD-10-CM | POA: Insufficient documentation

## 2018-09-13 DIAGNOSIS — Z794 Long term (current) use of insulin: Secondary | ICD-10-CM | POA: Diagnosis not present

## 2018-09-13 DIAGNOSIS — F1410 Cocaine abuse, uncomplicated: Secondary | ICD-10-CM | POA: Insufficient documentation

## 2018-09-13 DIAGNOSIS — F17210 Nicotine dependence, cigarettes, uncomplicated: Secondary | ICD-10-CM

## 2018-09-13 DIAGNOSIS — N50819 Testicular pain, unspecified: Secondary | ICD-10-CM | POA: Diagnosis not present

## 2018-09-13 DIAGNOSIS — Z5900 Homelessness unspecified: Secondary | ICD-10-CM

## 2018-09-13 DIAGNOSIS — I5042 Chronic combined systolic (congestive) and diastolic (congestive) heart failure: Secondary | ICD-10-CM

## 2018-09-13 DIAGNOSIS — Z9119 Patient's noncompliance with other medical treatment and regimen: Secondary | ICD-10-CM

## 2018-09-13 DIAGNOSIS — I5043 Acute on chronic combined systolic (congestive) and diastolic (congestive) heart failure: Secondary | ICD-10-CM | POA: Diagnosis not present

## 2018-09-13 DIAGNOSIS — E119 Type 2 diabetes mellitus without complications: Secondary | ICD-10-CM

## 2018-09-13 DIAGNOSIS — N5089 Other specified disorders of the male genital organs: Secondary | ICD-10-CM

## 2018-09-13 DIAGNOSIS — N492 Inflammatory disorders of scrotum: Secondary | ICD-10-CM | POA: Diagnosis present

## 2018-09-13 DIAGNOSIS — R600 Localized edema: Secondary | ICD-10-CM

## 2018-09-13 LAB — COMPREHENSIVE METABOLIC PANEL
ALBUMIN: 2.6 g/dL — AB (ref 3.5–5.0)
ALT: 28 U/L (ref 0–44)
AST: 42 U/L — ABNORMAL HIGH (ref 15–41)
Alkaline Phosphatase: 91 U/L (ref 38–126)
Anion gap: 6 (ref 5–15)
BUN: 17 mg/dL (ref 6–20)
CO2: 23 mmol/L (ref 22–32)
Calcium: 8.5 mg/dL — ABNORMAL LOW (ref 8.9–10.3)
Chloride: 108 mmol/L (ref 98–111)
Creatinine, Ser: 1.02 mg/dL (ref 0.61–1.24)
GFR calc Af Amer: >60 mL/min (ref >60)
GFR calc non Af Amer: >60 mL/min (ref >60)
Glucose, Bld: 256 mg/dL — ABNORMAL HIGH (ref 70–99)
Potassium: 3.5 mmol/L (ref 3.5–5.1)
Sodium: 137 mmol/L (ref 135–145)
TOTAL PROTEIN: 5.7 g/dL — AB (ref 6.5–8.1)
Total Bilirubin: 1.1 mg/dL (ref 0.3–1.2)

## 2018-09-13 LAB — URINALYSIS, ROUTINE W REFLEX MICROSCOPIC
BACTERIA UA: NONE SEEN
Bilirubin Urine: NEGATIVE
Glucose, UA: >=500 mg/dL — AB
HGB URINE DIPSTICK: NEGATIVE
Ketones, ur: NEGATIVE mg/dL
Leukocytes,Ua: NEGATIVE
Nitrite: NEGATIVE
Protein, ur: 100 mg/dL — AB
Specific Gravity, Urine: 1.022 (ref 1.005–1.030)
pH: 5 (ref 5.0–8.0)

## 2018-09-13 LAB — CBC WITH DIFFERENTIAL/PLATELET
Abs Immature Granulocytes: 0.03 10*3/uL (ref 0.00–0.07)
BASOS ABS: 0.1 10*3/uL (ref 0.0–0.1)
Basophils Relative: 1 %
Eosinophils Absolute: 0 10*3/uL (ref 0.0–0.5)
Eosinophils Relative: 0 %
HCT: 34.4 % — ABNORMAL LOW (ref 39.0–52.0)
Hemoglobin: 10.4 g/dL — ABNORMAL LOW (ref 13.0–17.0)
IMMATURE GRANULOCYTES: 0 %
Lymphocytes Relative: 11 %
Lymphs Abs: 1 10*3/uL (ref 0.7–4.0)
MCH: 25.7 pg — ABNORMAL LOW (ref 26.0–34.0)
MCHC: 30.2 g/dL (ref 30.0–36.0)
MCV: 85.1 fL (ref 80.0–100.0)
Monocytes Absolute: 0.8 10*3/uL (ref 0.1–1.0)
Monocytes Relative: 8 %
NEUTROS ABS: 7.2 10*3/uL (ref 1.7–7.7)
Neutrophils Relative %: 80 %
Platelets: 289 10*3/uL (ref 150–400)
RBC: 4.04 MIL/uL — ABNORMAL LOW (ref 4.22–5.81)
RDW: 16.9 % — ABNORMAL HIGH (ref 11.5–15.5)
WBC: 9.1 10*3/uL (ref 4.0–10.5)
nRBC: 0 % (ref 0.0–0.2)

## 2018-09-13 LAB — BRAIN NATRIURETIC PEPTIDE: B Natriuretic Peptide: 1279.9 pg/mL — ABNORMAL HIGH (ref 0.0–100.0)

## 2018-09-13 MED ORDER — ACETAMINOPHEN 500 MG PO TABS
1000.0000 mg | ORAL_TABLET | Freq: Once | ORAL | Status: AC
  Administered 2018-09-13: 1000 mg via ORAL
  Filled 2018-09-13: qty 2

## 2018-09-13 MED ORDER — FUROSEMIDE 10 MG/ML IJ SOLN: 40.0000 mg | Freq: Once | INTRAMUSCULAR | Status: DC

## 2018-09-13 MED ORDER — ACETAMINOPHEN 325 MG PO TABS
650.0000 mg | ORAL_TABLET | Freq: Once | ORAL | Status: AC
  Administered 2018-09-13: 650 mg via ORAL
  Filled 2018-09-13: qty 2

## 2018-09-13 MED ORDER — FUROSEMIDE 10 MG/ML IJ SOLN
40.0000 mg | Freq: Once | INTRAMUSCULAR | Status: AC
  Administered 2018-09-13: 40 mg via INTRAMUSCULAR
  Filled 2018-09-13: qty 4

## 2018-09-13 MED ORDER — FUROSEMIDE 40 MG PO TABS
40.0000 mg | ORAL_TABLET | Freq: Once | ORAL | Status: AC
  Administered 2018-09-13: 40 mg via ORAL
  Filled 2018-09-13: qty 1

## 2018-09-13 NOTE — ED Notes (Signed)
Pt able to tolerate sprite and water without any nausea or vomiting.   Pt has been tolerating graham crackers as well

## 2018-09-13 NOTE — ED Notes (Signed)
Pt went to X-ray.   

## 2018-09-13 NOTE — Discharge Instructions (Addendum)
You were seen in the ER for swelling in scrotum.  Recent labs and imaging is at your baseline.   You need to take lasix as prescribed.  Candice has your prescriptions.   Return for fever, worsening cough, chest pain or shortness of breath, skin break down or redness to scrotum

## 2018-09-13 NOTE — ED Notes (Signed)
Delay in lab draw,  Pt not in room 

## 2018-09-13 NOTE — ED Notes (Signed)
Patient verbalizes understanding of discharge instructions. Opportunity for questioning and answers were provided. Armband removed by staff, pt discharged from ED by ambulatory

## 2018-09-13 NOTE — ED Notes (Signed)
Pt refused blood draw,  Request graham crackers and milk first.

## 2018-09-13 NOTE — ED Notes (Signed)
Pt still in CT

## 2018-09-13 NOTE — ED Notes (Signed)
Bed: QA06 Expected date:  Expected time:  Means of arrival:  Comments: EMS 58 yo male testicular swelling-size of softball-hx of same resoved with "fluid pill"

## 2018-09-13 NOTE — ED Provider Notes (Addendum)
[facility]-EMERGENCY DEPT Provider Note   CSN: [id number] Arrival date & time: 09/13/18  2005    History   Chief Complaint Chief Complaint  Patient presents with   Groin Swelling    HPI [name] is a 58 y.o. male with history of combined systolic and diastolic heart failure EF 25 to 30%, cocaine abuse, diabetes, homelessness, hypertension, diabetic peripheral neuropathy, schizophrenia, sleep apnea not on CPAP chronic peripheral edema is here for evaluation of bilateral leg and scrotal swelling.  Onset 48 hours ago.  Associated with pain in his ankles, legs and scrotum 8/10, constant, worse with any movement or walking.  No interventions.  No alleviating factors.  Patient presented to the ER earlier this morning.  His lab work was at his baseline.  Chest x-ray at his baseline.  Scrotal ultrasound without any emergent process.  He was discharged after given Lasix.  Patient states that he has not filled or taken Lasix since he was discharged from the ER.  Patient states he came to [facility] long ER because in the past they have admitted him 9 times.  Admits to smoking crack cocaine prior to arrival.  He is asking for milk and crackers.  Reports chronic cough that is productive but unchanged.  He denies any fevers, chest pain, shortness of breath, dysuria, nausea, vomiting, abdominal pain.    HPI  Past Medical History:  Diagnosis Date   CHF (congestive heart failure) (HCC)    Chronic foot pain    Cocaine abuse (HCC)    Diabetes mellitus without complication (HCC)    Hepatitis C    unsure    Homelessness    Hypertension    Neuropathy    Polysubstance abuse (HCC)    Schizophrenia (HCC)    Sleep apnea     Patient Active Problem List   Diagnosis Date Noted   Urinary hesitancy    Constipation    Acute on chronic combined systolic and diastolic CHF (congestive heart failure) (HCC) 08/25/2018   Acute respiratory failure with hypoxia (HCC)  08/25/2018   Acute on chronic combined systolic and diastolic congestive heart failure (HCC) 08/18/2018   Tobacco use 08/18/2018   Acute on chronic combined systolic (congestive) and diastolic (congestive) heart failure (HCC) 08/08/2018   Homelessness 08/08/2018   Smoker 08/08/2018   CHF (congestive heart failure) (HCC) 07/15/2018   Acute exacerbation of CHF (congestive heart failure) (HCC) 03/28/2018   Prostate enlargement 03/16/2018   Aortic atherosclerosis (HCC) 03/16/2018   Aneurysm of abdominal aorta (HCC) 03/16/2018   Chronic foot pain    Schizoaffective disorder, bipolar type (HCC) 09/30/2016   Substance induced mood disorder (HCC) 03/13/2015   Acute kidney failure (HCC) 01/26/2015   Schizophrenia, paranoid type (HCC) 01/17/2015   Suicidal ideation    Drug hallucinosis (HCC) 10/08/2014   Chronic paranoid schizophrenia (HCC) 09/07/2014   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 Authorizing Provider  ARIPiprazole (ABILIFY) 10 MG tablet Take 1 tablet (10 mg total) by mouth daily. 09/04/18   Rodolph Bong,  MD  aspirin EC 81 MG tablet Take 1 tablet (81 mg total) by mouth daily. Patient not taking: Reported on 08/18/2018 08/09/18 08/09/19  Berton Mount I, MD  atorvastatin (LIPITOR) 40 MG tablet Take 1 tablet (40 mg total) by mouth daily. 09/04/18   Rodolph Bong, MD  bisacodyl (DULCOLAX) 5 MG EC tablet Take 1 tablet (5 mg total) by mouth daily as needed for moderate constipation. 09/04/18   Rodolph Bong, MD  carbamazepine (TEGRETOL) 200 MG tablet Take 0.5 tablets (100 mg total) by mouth 2 (two) times daily. 09/04/18   Rodolph Bong, MD  carvedilol (COREG) 6.25 MG tablet Take 1 tablet (6.25 mg total) by mouth 2 (two) times daily with a meal. 09/04/18   Rodolph Bong, MD  furosemide (LASIX) 40 MG tablet Take 1 tablet (40 mg total) by mouth daily. 09/12/18   Cardama, Amadeo Garnet, MD  Insulin Glargine (LANTUS) 100 UNIT/ML Solostar Pen Inject 14 Units into the skin daily. 09/04/18   Rodolph Bong, MD  Insulin Pen Needle 29G X MISC Use with insulin 09/04/18   Rodolph Bong, MD  lisinopril (PRINIVIL,ZESTRIL) 10 MG tablet Take 1 tablet (10 mg total) by mouth daily. 09/04/18   Rodolph Bong, MD  nicotine (NICODERM CQ - DOSED IN MG/24 HOURS) 21 mg/24hr patch Place 1 patch (21 mg total) onto the skin daily. 09/04/18   Rodolph Bong, MD  polyethylene glycol [facility] / Ethelene Hal) packet Take 17 g by mouth 2 (two) times daily. 09/04/18   Rodolph Bong, MD  Potassium Chloride ER 20 MEQ TBCR Take 20 mEq by mouth daily. 09/04/18   Rodolph Bong, MD  senna-docusate (SENOKOT-S) 8.6-50 MG tablet Take 1 tablet by mouth 2 (two) times daily. 09/04/18   Rodolph Bong, MD  tamsulosin [facility]) 0.4 MG CAPS capsule Take 1 capsule (0.4 mg total) by mouth daily after breakfast. 09/04/18   Rodolph Bong, MD    Family History Family History  Problem Relation Age of Onset   Hypertension Other    Diabetes Other     Social History Social History   Tobacco Use   Smoking status: Current Every Day Smoker    Packs/day: 1.00    Years: 20.00    Pack years: 20.00    Types: Cigarettes   Smokeless tobacco: Current User  Substance Use Topics   Alcohol use: Yes    Comment: Daily Drinker    Drug use: Yes    Frequency: 7.0 times per week    Types: "Crack" cocaine, Cocaine, Marijuana     Allergies   Haldol [haloperidol]   Review of Systems Review of Systems  Cardiovascular:  Positive for leg swelling.  Genitourinary: Positive for scrotal swelling and testicular pain.  All other systems reviewed and are negative.    Physical Exam Updated Vital Signs BP (!) 169/92    Pulse (!) 102    Temp 98.2 F (36.8 C)    Resp 17    Ht  (1.778 m)    Wt 84 kg    SpO2 96%    BMI 26.57 kg/m   Physical Exam Vitals signs and nursing note reviewed.  Constitutional:      Appearance: He is well-developed.     Comments: Poor hygiene.  Looks older than stated age.  Pressured speech, intermittently tangential.  HENT:     Head: Normocephalic and atraumatic.     Nose: Nose normal.  Eyes:  Conjunctiva/sclera: Conjunctivae normal.     Pupils: Pupils are equal, round, and reactive to light.  Neck:     Musculoskeletal: Normal range of motion.     Comments: No obvious JVD. Cardiovascular:     Rate and Rhythm: Regular rhythm. Tachycardia present.     Heart sounds: Normal heart sounds.     Comments: Borderline tachycardic heart rate high 90s to 105.  1+ radial pulses bilaterally.  2+ pitting edema to the knees and medial posterior upper legs.  Edema is symmetric. Toes are warm with brisk cap refill. No asymmetry to calves. No focal calf tenderness.  Pulmonary:     Effort: Pulmonary effort is normal.     Breath sounds: Rhonchi present.     Comments: Faint expiratory coarse lung sounds to lower lobes bilaterally.  No SPO2 no hypoxia during exam, tachypnea.  Speaking in full sentences.  Normal work of breathing. Abdominal:     General: Bowel sounds are normal.     Palpations: Abdomen is soft.     Tenderness: There is no abdominal tenderness.     Comments: Lower abdomen, Inguinal creases and suprapubic area appear to be edematous, mildly tender.  No obvious inguinal bulging or hernias. Soft abdomen. Negative McBurney's and Murphys. No CVAT.  Genitourinary:    Scrotum/Testes:        Right: Tenderness and swelling present.        Left: Tenderness and swelling present.      Comments: Moderate diffuse scrotal edema, firmness and tenderness.  Glans, penis, inguinal, and perineal skin without erythema, lesions, warmth, skin break down.  No urethral discharge.  Unable to test for cremasteric reflex due to edema and pain.  Musculoskeletal: Normal range of motion.  Skin:    General: Skin is warm and dry.     Capillary Refill: Capillary refill takes less than 2 seconds.  Neurological:     Mental Status: He is alert and oriented to person, place, and time.  Psychiatric:        Mood and Affect: Affect is labile.        Speech: Speech is rapid and pressured and tangential.      ED Treatments / Results  Labs (all labs ordered are listed, but only abnormal results are displayed) Labs Reviewed  URINALYSIS, ROUTINE W REFLEX MICROSCOPIC - Abnormal; Notable for the following components:      Result Value   Glucose, UA >=500 (*)    Protein, ur 100 (*)    All other components within normal limits  URINE CULTURE    EKG None  Radiology Dg Chest 2 View  Result Date: 09/13/2018 CLINICAL DATA:  Groin swelling short of breath EXAM: CHEST - 2 VIEW COMPARISON:  09/10/2018, 09/08/2018, 09/01/2018 FINDINGS: Cardiomegaly with vascular congestion. Linear atelectasis in the left mid lung. No acute consolidation or significant pleural effusion. No pneumothorax. Aortic atherosclerosis. IMPRESSION: Cardiomegaly with mild central vascular congestion. Electronically Signed   By: [name]   On: 09/13/2018 03:19   US Scrotum W/doppler  Result Date: 09/13/2018 CLINICAL DATA:  Swelling and testicular pain EXAM: SCROTAL ULTRASOUND DOPPLER ULTRASOUND OF THE TESTICLES TECHNIQUE: Complete ultrasound examination of the testicles, epididymis, and other scrotal structures was performed. Color and spectral Doppler ultrasound were also utilized to evaluate blood flow to the testicles. COMPARISON:  07/18/2018 FINDINGS: Right testicle Measurements: 3 x 2.9 x 3 cm.  No mass or microlithiasis  visualized. Left testicle Measurements: 3.5 x 2.4 x 2.2 cm. No mass or microlithiasis  visualized. Right epididymis:  Multiple cysts, the largest measures 2.3 cm Left epididymis:  Normal in size and appearance. Hydrocele:  Small right-sided hydrocele. Varicocele:  None visualized. Pulsed Doppler interrogation of both testes demonstrates normal low resistance arterial and venous waveforms bilaterally. Diffuse skin thickening and scrotal edema. Possible fat hernia in the left groin. IMPRESSION: 1. Negative for acute testicular torsion or suspicious testicular mass lesion 2. Marked scrotal edema. 3. Small right hydrocele.  Multiple right epididymal cysts 4. Possible fat containing left groin hernia Electronically Signed   By: [name]   On: 09/13/2018 03:26    Procedures Procedures (including critical care time)  Medications Ordered in ED Medications  furosemide (LASIX) tablet 40 mg (40 mg Oral Given 09/13/18 2059)  acetaminophen (TYLENOL) tablet 1,000 mg (1,000 mg Oral Given 09/13/18 2315)     Initial Impression / Assessment and Plan / ED Course  I have reviewed the triage vital signs and the nursing notes.  Pertinent labs & imaging results that were available during my care of the patient were reviewed by me and considered in my medical decision making (see chart for details).  Clinical Course as of Sep 14 18  Wynelle Link Sep 13, 2018  2124 Pt ambulated with portable SpO2. O2 sats remained between 93-96% on room air. Heart rate stayed around 110. Prior to walking pt was advised of what would be done and immediately fell asleep prior to getting up to walk.    [CG]    Clinical Course User Index [CG] Liberty Handy, PA-C      Pt seen in ER earlier this morning for scrotal and peripheral edema.  I reviewed his chart.  Labs including CBC, BMP, BNP remarkable for baseline anemia, hyperglycemia.  His BNP has been in the 1000's range for the last month.  Chest x-ray shows stable vascular  congestion.  Scrotal US shows marked edema but no emergent process.  He was given IV Lasix earlier this morning in ER and dc with prescriptin for lasix.   Upon arrival mildly hypertensive, tachycardic, pressured speech and tangential likely from acute cocaine intoxication.  HD stable.  Ambulated in ER w/o hypoxia. He has had large amount of urine output in the ER.  Although his last echo shows EF 25-30%, ER work-up earlier today at his baseline.  I spoke to patient's [facility] coordinator Clydie Braun who states she is no longer working with [facility] or with the patient.  She confirms pt has never been compliant at home with his psych meds, sometimes takes his other medications.  I contacted patient's cousin Candace who states she has patient's prescription for Lasix and plans on refilling it in the morning.  Candace has been able to track patient down and give him his medicines in the past and reassures me she will do this tomorrow.  I explained this to patient.  Patient appropriate for discharge.  Discussed with EDMD.  Given his lack of low literacy, homelessness, substance abuse patient may return to the ER with similar complaints soon but at this time exam is reassuring.  No respiratory distress or increased work of breathing, exertional hypoxia.  He is denying CP, SOB.  Emergent  repeat labs/work-up not considered needed again tonight.    Final Clinical Impressions(s) / ED Diagnoses   Final diagnoses:  Scrotal edema  Medical non-compliance  Homelessness  Cocaine abuse [facility])    ED Discharge Orders    None       Liberty Handy, PA-C 09/14/18  0938    Rolan Bucco, MD 09/14/18 1458

## 2018-09-13 NOTE — ED Provider Notes (Signed)
[facility] EMERGENCY DEPARTMENT Provider Note   CSN: [id number] Arrival date & time: 09/13/18  0033    History   Chief Complaint Chief Complaint  Patient presents with  . Groin Swelling    HPI [name] is a 58 y.o. male.     Patient well-known to the ED.  History of CHF, diabetes, cocaine abuse, noncompliance.  He presents with bilateral leg swelling and scrotal swelling.  He states this is been going on for the past several days.  He was prescribed Lasix but did not get it filled but plans to fill in the morning.  History of noncompliance and he is not certain when he last had his Lasix.  He does have mild shortness of breath.  No chest pain, cough or fever.  No problems with urination.  The history is provided by the patient.    Past Medical History:  Diagnosis Date  . CHF (congestive heart failure) (HCC)   . Chronic foot pain   . Cocaine abuse (HCC)   . Diabetes mellitus without complication (HCC)   . Hepatitis C    unsure   . Homelessness   . Hypertension   . Neuropathy   . Polysubstance abuse (HCC)   . Schizophrenia (HCC)   . Sleep apnea     Patient Active Problem List   Diagnosis Date Noted  . Urinary hesitancy   . Constipation   . Acute on chronic combined systolic and diastolic CHF (congestive heart failure) (HCC) 08/25/2018  . Acute respiratory failure with hypoxia (HCC) 08/25/2018  . Acute on chronic combined systolic and diastolic congestive heart failure (HCC) 08/18/2018  . Tobacco use 08/18/2018  . 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 Homelessness 08/08/2018  . Smoker 08/08/2018  . CHF (congestive heart failure) (HCC) 07/15/2018  . Acute exacerbation of CHF (congestive heart failure) (HCC) 03/28/2018  . Prostate enlargement 03/16/2018  . Aortic atherosclerosis (HCC) 03/16/2018  . 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 Chronic foot pain    . Schizoaffective disorder, bipolar type (HCC) 09/30/2016  . Substance induced mood disorder (HCC) 03/13/2015  . Acute kidney failure (HCC) 01/26/2015  . Schizophrenia, paranoid type (HCC) 01/17/2015  . Suicidal ideation   . Drug hallucinosis (HCC) 10/08/2014  . Chronic paranoid schizophrenia (HCC) 09/07/2014  . 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 Urinary retention   . Cocaine use disorder, severe, dependence (HCC)   . Essential hypertension, benign 03/28/2013  . 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 MULTIPLE TOOTH EXTRACTIONS          Home Medications    Prior to Admission medications   Medication Sig Start Date End Date Taking? Authorizing Provider  ARIPiprazole (ABILIFY) 10 MG tablet Take 1 tablet (10 mg total) by mouth daily. 09/04/18   Rodolph Bong, MD  aspirin EC 81 MG tablet Take 1 tablet (81 mg total) by mouth daily. Patient not taking: Reported on 08/18/2018 08/09/18 08/09/19  Berton Mount I, MD  atorvastatin (LIPITOR) 40 MG tablet Take 1 tablet (40 mg total) by mouth daily. 09/04/18   Rodolph Bong, MD  bisacodyl (DULCOLAX) 5 MG EC tablet Take 1 tablet (5 mg total) by mouth daily as needed for moderate constipation. 09/04/18   Rodolph Bong, MD  carbamazepine (TEGRETOL) 200 MG tablet  Take 0.5 tablets (100 mg total) by mouth 2 (two) times daily. 09/04/18   Rodolph Bong, MD  carvedilol (COREG) 6.25 MG tablet Take 1 tablet (6.25 mg total) by mouth 2 (two) times daily with a meal. 09/04/18   Rodolph Bong, MD  furosemide (LASIX) 40 MG tablet Take 1 tablet (40 mg total) by mouth daily. 09/12/18   Cardama, Amadeo Garnet, MD  Insulin Glargine (LANTUS) 100 UNIT/ML Solostar Pen Inject 14 Units into the skin daily. 09/04/18   Rodolph Bong, MD  Insulin Pen Needle 29G X MISC Use with insulin  09/04/18   Rodolph Bong, MD  lisinopril (PRINIVIL,ZESTRIL) 10 MG tablet Take 1 tablet (10 mg total) by mouth daily. 09/04/18   Rodolph Bong, MD  nicotine (NICODERM CQ - DOSED IN MG/24 HOURS) 21 mg/24hr patch Place 1 patch (21 mg total) onto the skin daily. 09/04/18   Rodolph Bong, MD  polyethylene glycol [facility]) packet Take 17 g by mouth 2 (two) times daily. 09/04/18   Rodolph Bong, MD  Potassium Chloride ER 20 MEQ TBCR Take 20 mEq by mouth daily. 09/04/18   Rodolph Bong, MD  senna-docusate (SENOKOT-S) 8.6-50 MG tablet Take 1 tablet by mouth 2 (two) times daily. 09/04/18   Rodolph Bong, MD  tamsulosin [facility]) 0.4 MG CAPS capsule Take 1 capsule (0.4 mg total) by mouth daily after breakfast. 09/04/18   Rodolph Bong, MD    Family History Family History  Problem Relation Age of Onset  . Hypertension Other   . Diabetes Other     Social History Social History   Tobacco Use  . Smoking status: Current Every Day Smoker    Packs/day: 1.00    Years: 20.00    Pack years: 20.00    Types: Cigarettes  . Smokeless tobacco: Current User  Substance Use Topics  . Alcohol use: Yes    Comment: Daily Drinker   . Drug use: Yes    Frequency: 7.0 times per week    Types: "Crack" cocaine, Cocaine, Marijuana     Allergies   Haldol [haloperidol]   Review of Systems Review of Systems  Constitutional: Negative for activity change and appetite change.  HENT: Negative for congestion and rhinorrhea.   Eyes: Negative for visual disturbance.  Respiratory: Positive for shortness of breath. Negative for chest tightness.   Cardiovascular: Positive for leg swelling.  Gastrointestinal: Negative for abdominal pain, nausea and vomiting.  Genitourinary: Positive for penile swelling and scrotal swelling. Negative for dysuria and hematuria.  Skin: Negative for rash.  Neurological: Negative for dizziness, weakness and headaches.    all other systems are negative except  as noted in the HPI and PMH.    Physical Exam Updated Vital Signs BP (!) 160/99 (BP Location: Right Arm)   Pulse 90   Temp 98 F (36.7 C) (Oral)   Resp 16   SpO2 94%   Physical Exam Vitals signs and nursing note reviewed.  Constitutional:      General: He is not in acute distress.    Appearance: He is well-developed.  HENT:     Head: Normocephalic and atraumatic.     Mouth/Throat:     Pharynx: No oropharyngeal exudate.  Eyes:     Conjunctiva/sclera: Conjunctivae normal.     Pupils: Pupils are equal, round, and reactive to light.  Neck:     Musculoskeletal: Normal range of motion and neck supple.     Comments: No meningismus.  Cardiovascular:     Rate and Rhythm: Normal rate and regular rhythm.     Heart sounds: Normal heart sounds. No murmur.  Pulmonary:     Effort: Pulmonary effort is normal. No respiratory distress.     Breath sounds: Rales present.     Comments: Bibasilar crackles Abdominal:     Palpations: Abdomen is soft.     Tenderness: There is no abdominal tenderness. There is no guarding or rebound.  Genitourinary:    Comments: Large edema of scrotum with bilateral testicular tenderness Musculoskeletal: Normal range of motion.        General: No tenderness.     Right lower leg: Edema present.     Left lower leg: Edema present.     Comments: +3 edema to thighs bilaterally  Skin:    General: Skin is warm.     Capillary Refill: Capillary refill takes less than 2 seconds.  Neurological:     General: No focal deficit present.     Mental Status: He is alert and oriented to person, place, and time.     Cranial Nerves: No cranial nerve deficit.     Motor: No abnormal muscle tone.     Coordination: Coordination normal.     Comments:  5/5 strength throughout. CN 2-12 intact.Equal grip strength.   Psychiatric:        Behavior: Behavior normal.      ED Treatments / Results  Labs (all labs ordered are listed, but only abnormal results are displayed) Labs  Reviewed  CBC WITH DIFFERENTIAL/PLATELET - Abnormal; Notable for the following components:      Result Value   RBC 4.04 (*)    Hemoglobin 10.4 (*)    HCT 34.4 (*)    MCH 25.7 (*)    RDW 16.9 (*)    All other components within normal limits  COMPREHENSIVE METABOLIC PANEL - Abnormal; Notable for the following components:   Glucose, Bld 256 (*)    Calcium 8.5 (*)    Total Protein 5.7 (*)    Albumin 2.6 (*)    AST 42 (*)    All other components within normal limits  BRAIN NATRIURETIC PEPTIDE - Abnormal; Notable for the following components:   B Natriuretic Peptide 1,279.9 (*)    All other components within normal limits    EKG EKG Interpretation  Date/Time:  Sunday September 13 2018 04:56:07 EDT Ventricular Rate:  97 PR Interval:    QRS Duration: 94 QT Interval:  364 QTC Calculation: 463 R Axis:   5 Text Interpretation:  Sinus rhythm Anterior infarct, old Abnormal T, consider ischemia, lateral leads No significant change was found Confirmed by Glynn Octave [phone]) on 09/13/2018 5:12:59 AM Also confirmed by Glynn Octave [phone]), editor Barbette Hair [phone])  on 09/13/2018 7:21:21 AM   Radiology Dg Chest 2 View  Result Date: 09/13/2018 CLINICAL DATA:  Groin swelling short of breath EXAM: CHEST - 2 VIEW COMPARISON:  09/10/2018, 09/08/2018, 09/01/2018 FINDINGS: Cardiomegaly with vascular congestion. Linear atelectasis in the left mid lung. No acute consolidation or significant pleural effusion. No pneumothorax. Aortic atherosclerosis. IMPRESSION: Cardiomegaly with mild central vascular congestion. Electronically Signed   By: [name]   On: 09/13/2018 03:19   US Scrotum W/doppler  Result Date: 09/13/2018 CLINICAL DATA:  Swelling and testicular pain EXAM: SCROTAL ULTRASOUND DOPPLER ULTRASOUND OF THE TESTICLES TECHNIQUE: Complete ultrasound examination of the testicles, epididymis, and other scrotal structures was performed. Color and spectral Doppler ultrasound were also  utilized to evaluate  blood flow to the testicles. COMPARISON:  07/18/2018 FINDINGS: Right testicle Measurements: 3 x 2.9 x 3 cm.  No mass or microlithiasis visualized. Left testicle Measurements: 3.5 x 2.4 x 2.2 cm. No mass or microlithiasis visualized. Right epididymis:  Multiple cysts, the largest measures 2.3 cm Left epididymis:  Normal in size and appearance. Hydrocele:  Small right-sided hydrocele. Varicocele:  None visualized. Pulsed Doppler interrogation of both testes demonstrates normal low resistance arterial and venous waveforms bilaterally. Diffuse skin thickening and scrotal edema. Possible fat hernia in the left groin. IMPRESSION: 1. Negative for acute testicular torsion or suspicious testicular mass lesion 2. Marked scrotal edema. 3. Small right hydrocele.  Multiple right epididymal cysts 4. Possible fat containing left groin hernia Electronically Signed   By: [name]   On: 09/13/2018 03:26    Procedures Procedures (including critical care time)  Medications Ordered in ED Medications  furosemide (LASIX) injection 40 mg (has no administration in time range)  acetaminophen (TYLENOL) tablet 650 mg (has no administration in time range)     Initial Impression / Assessment and Plan / ED Course  I have reviewed the triage vital signs and the nursing notes.  Pertinent labs & imaging results that were available during my care of the patient were reviewed by me and considered in my medical decision making (see chart for details).       Patient with peripheral edema and history of noncompliance.  Denies shortness of breath.  Denies chest pain.  Will be given IV Lasix. [place] without testicular torsion. Scrotal edema noted.  Labs reassuring. CXR with congestion as expected.   Patient able to ambulate without desaturation or increased work of breathing. Denies chest pain. EKG unchanged. Peripheral edema and CHF exacerbation in setting of noncompliance with diuretics.  Has  prescription which he states he will get filled today.  IV lasix given .  Follwoup with PCP. Return precautions discussed.  Final Clinical Impressions(s) / ED Diagnoses   Final diagnoses:  Testicular pain  Scrotal swelling  Lower extremity edema    ED Discharge Orders    None       Yakub Lodes, Jeannett Senior, MD 09/13/18 [phone]

## 2018-09-13 NOTE — ED Triage Notes (Signed)
Pt reports ambulatory to ED with c/o of groin swelling- pt states he was here two days ago and unable to get prescription for lasix filled.

## 2018-09-13 NOTE — Discharge Instructions (Signed)
Take your Lasix as prescribed.  Follow-up with your doctor.  You should fill the prescription you got yesterday.  Return to the ED with chest pain, shortness of breath or any other concerns.  Stop using cocaine.

## 2018-09-13 NOTE — ED Triage Notes (Signed)
Pt came to the ED from [employer] due to swollen groin.  Pt states that he has to take lasix to resolve.  Reports recently smoking crack.

## 2018-09-13 NOTE — ED Notes (Signed)
Pt ambulated with portable SpO2. O2 sats remained between 93-96% on room air. Heart rate stayed around 110. Prior to walking pt was advised of what would be done and immediately fell asleep prior to getting up to walk.

## 2018-09-13 NOTE — ED Notes (Signed)
Lab called at this time to run urine specimen

## 2018-09-13 NOTE — ED Notes (Signed)
Ambulated pt with pulse ox. Pt maintained 02 at 93-94% during ambulation without oxygen useage

## 2018-09-14 ENCOUNTER — Other Ambulatory Visit: Payer: Self-pay

## 2018-09-14 ENCOUNTER — Emergency Department (HOSPITAL_COMMUNITY)
Admission: EM | Admit: 2018-09-14 | Discharge: 2018-09-14 | Disposition: A | Discharge status: Home / Self Care | Payer: Medicaid Other | Attending: Emergency Medicine | Admitting: Emergency Medicine

## 2018-09-14 ENCOUNTER — Encounter (HOSPITAL_COMMUNITY): Payer: Self-pay | Admitting: Emergency Medicine

## 2018-09-14 DIAGNOSIS — I110 Hypertensive heart disease with heart failure: Secondary | ICD-10-CM | POA: Diagnosis not present

## 2018-09-14 DIAGNOSIS — Z794 Long term (current) use of insulin: Secondary | ICD-10-CM | POA: Insufficient documentation

## 2018-09-14 DIAGNOSIS — F17210 Nicotine dependence, cigarettes, uncomplicated: Secondary | ICD-10-CM | POA: Diagnosis not present

## 2018-09-14 DIAGNOSIS — Z79899 Other long term (current) drug therapy: Secondary | ICD-10-CM | POA: Insufficient documentation

## 2018-09-14 DIAGNOSIS — I5042 Chronic combined systolic (congestive) and diastolic (congestive) heart failure: Secondary | ICD-10-CM | POA: Diagnosis not present

## 2018-09-14 DIAGNOSIS — R2241 Localized swelling, mass and lump, right lower limb: Secondary | ICD-10-CM | POA: Diagnosis present

## 2018-09-14 DIAGNOSIS — E119 Type 2 diabetes mellitus without complications: Secondary | ICD-10-CM | POA: Insufficient documentation

## 2018-09-14 DIAGNOSIS — Z7982 Long term (current) use of aspirin: Secondary | ICD-10-CM | POA: Insufficient documentation

## 2018-09-14 DIAGNOSIS — R600 Localized edema: Secondary | ICD-10-CM | POA: Diagnosis not present

## 2018-09-14 MED ORDER — ACETAMINOPHEN 325 MG PO TABS
650.0000 mg | ORAL_TABLET | Freq: Once | ORAL | Status: AC
  Administered 2018-09-14: 650 mg via ORAL
  Filled 2018-09-14: qty 2

## 2018-09-14 MED ORDER — FUROSEMIDE 20 MG PO TABS
40.0000 mg | ORAL_TABLET | Freq: Once | ORAL | Status: AC
  Administered 2018-09-14: 40 mg via ORAL
  Filled 2018-09-14: qty 2

## 2018-09-14 NOTE — ED Provider Notes (Signed)
[facility] EMERGENC[facility] Provider Note   CSN: [id number] Arrival date & time: 09/14/18  [id number]    History   Chief Complaint Chief Complaint  Patient presents with  . Leg Swelling    HPI [name] is a 58 y.o. male.     58 year old male with prior medical history as detailed below presents for evaluation of chronic lower extremity edema.  Patient was seen yesterday twice at [facility] facilities for same complaint.  His work-up and evaluations yesterday did not demonstrate acute pathology.  Patient returns today requesting something to eat.  He reportedly has not had breakfast.  He is otherwise without acute complaint.  He specifically denies chest pain, shortness of breath, nausea, vomiting, or other acute complaint.  Patient has longstanding history of noncompliance.  He is also homeless.  He request breakfast.  He does have a prescription for Lasix that is apparently available he just has not picked it up and actually used it.  The history is provided by the patient and medical records.  Illness  Location:  Right lower extremity edema Severity:  Mild Onset quality:  Unable to specify Timing:  Constant Progression:  Waxing and waning Chronicity:  Chronic Associated symptoms: no chest pain, no cough, no fever and no shortness of breath     Past Medical History:  Diagnosis Date  . CHF (congestive heart failure) (HCC)   . Chronic foot pain   . Cocaine abuse (HCC)   . Diabetes mellitus without complication (HCC)   . Hepatitis C    unsure   . Homelessness   . Hypertension   . Neuropathy   . Polysubstance abuse (HCC)   . Schizophrenia (HCC)   . Sleep apnea     Patient Active Problem List   Diagnosis Date Noted  . Urinary hesitancy   . Constipation   . Acute on chronic combined systolic and diastolic CHF (congestive heart failure) (HCC) 08/25/2018  . Acute respiratory failure with hypoxia (HCC) 08/25/2018  . Acute on chronic  combined systolic and diastolic congestive heart failure (HCC) 08/18/2018  . Tobacco use 08/18/2018  . 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 Homelessness 08/08/2018  . Smoker 08/08/2018  . CHF (congestive heart failure) (HCC) 07/15/2018  . Acute exacerbation of CHF (congestive heart failure) (HCC) 03/28/2018  . Prostate enlargement 03/16/2018  . Aortic atherosclerosis (HCC) 03/16/2018  . 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 Chronic foot pain   . Schizoaffective disorder, bipolar type (HCC) 09/30/2016  . Substance induced mood disorder (HCC) 03/13/2015  . Acute kidney failure (HCC) 01/26/2015  . Schizophrenia, paranoid type (HCC) 01/17/2015  . Suicidal ideation   . Drug hallucinosis (HCC) 10/08/2014  . Chronic paranoid schizophrenia (HCC) 09/07/2014  . 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 Urinary retention   . Cocaine use disorder, severe, dependence (HCC)   . Essential hypertension, benign 03/28/2013  . 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 MULTIPLE TOOTH EXTRACTIONS          Home Medications    Prior to Admission medications   Medication Sig Start Date End Date Taking? Authorizing Provider  ARIPiprazole (ABILIFY) 10 MG tablet Take 1 tablet (10 mg total) by mouth daily. 09/04/18   Rodolph Bonghompson, Daniel V, MD  aspirin EC 81 MG tablet Take 1 tablet (  81 mg total) by mouth daily. Patient not taking: Reported on 08/18/2018 08/09/18 08/09/19  Berton Mount I, MD  atorvastatin (LIPITOR) 40 MG tablet Take 1 tablet (40 mg total) by mouth daily. 09/04/18   Rodolph Bong, MD  bisacodyl (DULCOLAX) 5 MG EC tablet Take 1 tablet (5 mg total) by mouth daily as needed for moderate constipation. 09/04/18   Rodolph Bong, MD  carbamazepine (TEGRETOL)  200 MG tablet Take 0.5 tablets (100 mg total) by mouth 2 (two) times daily. 09/04/18   Rodolph Bong, MD  carvedilol (COREG) 6.25 MG tablet Take 1 tablet (6.25 mg total) by mouth 2 (two) times daily with a meal. 09/04/18   Rodolph Bong, MD  furosemide (LASIX) 40 MG tablet Take 1 tablet (40 mg total) by mouth daily. 09/12/18   Cardama, Amadeo Garnet, MD  Insulin Glargine (LANTUS) 100 UNIT/ML Solostar Pen Inject 14 Units into the skin daily. 09/04/18   Rodolph Bong, MD  Insulin Pen Needle 29G X MISC Use with insulin 09/04/18   Rodolph Bong, MD  lisinopril (PRINIVIL,ZESTRIL) 10 MG tablet Take 1 tablet (10 mg total) by mouth daily. 09/04/18   Rodolph Bong, MD  nicotine (NICODERM CQ - DOSED IN MG/24 HOURS) 21 mg/24hr patch Place 1 patch (21 mg total) onto the skin daily. 09/04/18   Rodolph Bong, MD  polyethylene glycol [facility]) packet Take 17 g by mouth 2 (two) times daily. 09/04/18   Rodolph Bong, MD  Potassium Chloride ER 20 MEQ TBCR Take 20 mEq by mouth daily. 09/04/18   Rodolph Bong, MD  senna-docusate (SENOKOT-S) 8.6-50 MG tablet Take 1 tablet by mouth 2 (two) times daily. 09/04/18   Rodolph Bong, MD  tamsulosin [facility]) 0.4 MG CAPS capsule Take 1 capsule (0.4 mg total) by mouth daily after breakfast. 09/04/18   Rodolph Bong, MD    Family History Family History  Problem Relation Age of Onset  . Hypertension Other   . Diabetes Other     Social History Social History   Tobacco Use  . Smoking status: Current Every Day Smoker    Packs/day: 1.00    Years: 20.00    Pack years: 20.00    Types: Cigarettes  . Smokeless tobacco: Current User  Substance Use Topics  . Alcohol use: Yes    Comment: Daily Drinker   . Drug use: Yes    Frequency: 7.0 times per week    Types: "Crack" cocaine, Cocaine, Marijuana     Allergies   Haldol [haloperidol]   Review of Systems Review of Systems  Constitutional: Negative for fever.  Respiratory:  Negative for cough and shortness of breath.   Cardiovascular: Negative for chest pain.  All other systems reviewed and are negative.    Physical Exam Updated Vital Signs BP (!) 170/103   Pulse 94   Temp 99 F (37.2 C) (Oral)   SpO2 99%   Physical Exam Vitals signs and nursing note reviewed.  Constitutional:      General: He is not in acute distress.    Appearance: He is well-developed.  HENT:     Head: Normocephalic and atraumatic.  Eyes:     Conjunctiva/sclera: Conjunctivae normal.     Pupils: Pupils are equal, round, and reactive to light.  Neck:     Musculoskeletal: Normal range of motion and neck supple.  Cardiovascular:     Rate and Rhythm: Normal rate and regular rhythm.  Heart sounds: Normal heart sounds.  Pulmonary:     Effort: Pulmonary effort is normal. No respiratory distress.     Breath sounds: Normal breath sounds.  Abdominal:     General: There is no distension.     Palpations: Abdomen is soft.     Tenderness: There is no abdominal tenderness.  Musculoskeletal: Normal range of motion.        General: No deformity.     Right lower leg: Edema present.     Left lower leg: Edema present.     Comments: 2-3+ lower extremity edema  Moderate scrotal edema    Skin:    General: Skin is warm and dry.  Neurological:     General: No focal deficit present.     Mental Status: He is alert and oriented to person, place, and time. Mental status is at baseline.      ED Treatments / Results  Labs (all labs ordered are listed, but only abnormal results are displayed) Labs Reviewed - No data to display  EKG None  Radiology Dg Chest 2 View  Result Date: 09/13/2018 CLINICAL DATA:  Groin swelling short of breath EXAM: CHEST - 2 VIEW COMPARISON:  09/10/2018, 09/08/2018, 09/01/2018 FINDINGS: Cardiomegaly with vascular congestion. Linear atelectasis in the left mid lung. No acute consolidation or significant pleural effusion. No pneumothorax. Aortic  atherosclerosis. IMPRESSION: Cardiomegaly with mild central vascular congestion. Electronically Signed   By: [name]   On: 09/13/2018 03:19   US Scrotum W/doppler  Result Date: 09/13/2018 CLINICAL DATA:  Swelling and testicular pain EXAM: SCROTAL ULTRASOUND DOPPLER ULTRASOUND OF THE TESTICLES TECHNIQUE: Complete ultrasound examination of the testicles, epididymis, and other scrotal structures was performed. Color and spectral Doppler ultrasound were also utilized to evaluate blood flow to the testicles. COMPARISON:  07/18/2018 FINDINGS: Right testicle Measurements: 3 x 2.9 x 3 cm.  No mass or microlithiasis visualized. Left testicle Measurements: 3.5 x 2.4 x 2.2 cm. No mass or microlithiasis visualized. Right epididymis:  Multiple cysts, the largest measures 2.3 cm Left epididymis:  Normal in size and appearance. Hydrocele:  Small right-sided hydrocele. Varicocele:  None visualized. Pulsed Doppler interrogation of both testes demonstrates normal low resistance arterial and venous waveforms bilaterally. Diffuse skin thickening and scrotal edema. Possible fat hernia in the left groin. IMPRESSION: 1. Negative for acute testicular torsion or suspicious testicular mass lesion 2. Marked scrotal edema. 3. Small right hydrocele.  Multiple right epididymal cysts 4. Possible fat containing left groin hernia Electronically Signed   By: [name]   On: 09/13/2018 03:26    Procedures Procedures (including critical care time)  Medications Ordered in ED Medications  furosemide (LASIX) tablet 40 mg (has no administration in time range)  acetaminophen (TYLENOL) tablet 650 mg (has no administration in time range)     Initial Impression / Assessment and Plan / ED Course  I have reviewed the triage vital signs and the nursing notes.  Pertinent labs & imaging results that were available during my care of the patient were reviewed by me and considered in my medical decision making (see chart for  details).        MDM  Screen complete  Patient is presenting for evaluation of chronic complaints.  He is without acute medical complaint or pathology.  His exam is not suggestive of an acute medical process.  He had a recent work-up for his same complaint yesterday.  He is appropriate for discharge.  He is advised to follow-up closely  with her regular care provider.  He is advised to return for any problem.  Strict return precautions given and understood.   Final Clinical Impressions(s) / ED Diagnoses   Final diagnoses:  Leg edema    ED Discharge Orders    None       Wynetta Fines, MD 09/14/18 [phone]

## 2018-09-14 NOTE — Discharge Instructions (Addendum)
Return for any problem.  Follow-up with your regular doctor.

## 2018-09-14 NOTE — ED Triage Notes (Signed)
Pt has swelling to bilateral extremities and scrotal swelling. Has lasix but has lost them. Homeless. Lungs clear. CBG always high

## 2018-09-15 ENCOUNTER — Encounter (HOSPITAL_COMMUNITY): Payer: Self-pay

## 2018-09-15 ENCOUNTER — Other Ambulatory Visit: Payer: Self-pay

## 2018-09-15 ENCOUNTER — Emergency Department (HOSPITAL_COMMUNITY)
Admission: EM | Admit: 2018-09-15 | Discharge: 2018-09-15 | Disposition: A | Discharge status: Home / Self Care | Payer: Medicaid Other | Attending: Emergency Medicine | Admitting: Emergency Medicine

## 2018-09-15 ENCOUNTER — Emergency Department (HOSPITAL_COMMUNITY): Payer: Medicaid Other

## 2018-09-15 DIAGNOSIS — E1140 Type 2 diabetes mellitus with diabetic neuropathy, unspecified: Secondary | ICD-10-CM | POA: Insufficient documentation

## 2018-09-15 DIAGNOSIS — N5089 Other specified disorders of the male genital organs: Secondary | ICD-10-CM | POA: Insufficient documentation

## 2018-09-15 DIAGNOSIS — F17210 Nicotine dependence, cigarettes, uncomplicated: Secondary | ICD-10-CM | POA: Diagnosis not present

## 2018-09-15 DIAGNOSIS — I5042 Chronic combined systolic (congestive) and diastolic (congestive) heart failure: Secondary | ICD-10-CM | POA: Diagnosis not present

## 2018-09-15 DIAGNOSIS — R2243 Localized swelling, mass and lump, lower limb, bilateral: Secondary | ICD-10-CM | POA: Diagnosis present

## 2018-09-15 DIAGNOSIS — Z794 Long term (current) use of insulin: Secondary | ICD-10-CM | POA: Diagnosis not present

## 2018-09-15 DIAGNOSIS — I110 Hypertensive heart disease with heart failure: Secondary | ICD-10-CM | POA: Diagnosis not present

## 2018-09-15 DIAGNOSIS — Z79899 Other long term (current) drug therapy: Secondary | ICD-10-CM | POA: Insufficient documentation

## 2018-09-15 DIAGNOSIS — R609 Edema, unspecified: Secondary | ICD-10-CM

## 2018-09-15 LAB — I-STAT TROPONIN, ED: Troponin i, poc: 0.06 ng/mL (ref 0.00–0.08)

## 2018-09-15 LAB — CBC WITH DIFFERENTIAL/PLATELET
ABS IMMATURE GRANULOCYTES: 0.03 10*3/uL (ref 0.00–0.07)
BASOS PCT: 0 %
Basophils Absolute: 0 10*3/uL (ref 0.0–0.1)
Eosinophils Absolute: 0 10*3/uL (ref 0.0–0.5)
Eosinophils Relative: 0 %
HCT: 34.5 % — ABNORMAL LOW (ref 39.0–52.0)
Hemoglobin: 10.2 g/dL — ABNORMAL LOW (ref 13.0–17.0)
Immature Granulocytes: 0 %
Lymphocytes Relative: 8 %
Lymphs Abs: 0.8 10*3/uL (ref 0.7–4.0)
MCH: 24.6 pg — ABNORMAL LOW (ref 26.0–34.0)
MCHC: 29.6 g/dL — ABNORMAL LOW (ref 30.0–36.0)
MCV: 83.3 fL (ref 80.0–100.0)
Monocytes Absolute: 0.7 10*3/uL (ref 0.1–1.0)
Monocytes Relative: 7 %
NEUTROS ABS: 8.1 10*3/uL — AB (ref 1.7–7.7)
Neutrophils Relative %: 85 %
Platelets: 297 10*3/uL (ref 150–400)
RBC: 4.14 MIL/uL — ABNORMAL LOW (ref 4.22–5.81)
RDW: 16.8 % — ABNORMAL HIGH (ref 11.5–15.5)
WBC: 9.7 10*3/uL (ref 4.0–10.5)
nRBC: 0 % (ref 0.0–0.2)

## 2018-09-15 LAB — BASIC METABOLIC PANEL
Anion gap: 10 (ref 5–15)
BUN: 21 mg/dL — ABNORMAL HIGH (ref 6–20)
CO2: 24 mmol/L (ref 22–32)
Calcium: 9 mg/dL (ref 8.9–10.3)
Chloride: 104 mmol/L (ref 98–111)
Creatinine, Ser: 1.07 mg/dL (ref 0.61–1.24)
GFR calc Af Amer: >60 mL/min (ref >60)
GFR calc non Af Amer: >60 mL/min (ref >60)
GLUCOSE: 275 mg/dL — AB (ref 70–99)
Potassium: 3.7 mmol/L (ref 3.5–5.1)
Sodium: 138 mmol/L (ref 135–145)

## 2018-09-15 LAB — URINE CULTURE: Culture: <10000 — AB

## 2018-09-15 LAB — BRAIN NATRIURETIC PEPTIDE: B Natriuretic Peptide: 1247 pg/mL — ABNORMAL HIGH (ref 0.0–100.0)

## 2018-09-15 MED ORDER — FUROSEMIDE 10 MG/ML IJ SOLN
40.0000 mg | Freq: Once | INTRAMUSCULAR | Status: AC
  Administered 2018-09-15: 40 mg via INTRAVENOUS
  Filled 2018-09-15: qty 4

## 2018-09-15 NOTE — Discharge Instructions (Addendum)
Thank you for allowing me to care for you today in the Emergency Department.   Follow up with your doctor.  Take your Lasix at home.   Return to the emergency department for new or worsening symptoms.

## 2018-09-15 NOTE — ED Notes (Signed)
Pt placed on 3 lpm via [place]. O2 sats increased from 89% on RA to 98%.

## 2018-09-15 NOTE — ED Triage Notes (Signed)
Per [facility], pt from a store w/ a c/o central chest pain, BLE, swelling in groin, and swelling in abd. The chest pain began shortly after the crack use. Hx of CHF, MI, and DM.  180/110 HR 103 CBG 324  324 mg ASA 1 nitro  CP decreased from a 10/10 --> 9/10 after 1 nitro.

## 2018-09-15 NOTE — ED Notes (Signed)
Patient given [place] sandwich, cheese , Sprite , [employer].

## 2018-09-15 NOTE — ED Notes (Signed)
Patient ambulated hallway with standby assist , O2 Sat= 91% room air while ambulating PA notified .

## 2018-09-15 NOTE — ED Notes (Signed)
Patient transported to X-ray 

## 2018-09-15 NOTE — ED Provider Notes (Signed)
[facility] EMERGENCY DEPARTMENT Provider Note   CSN: [id number] Arrival date & time: 09/15/18  8786    History   Chief Complaint Chief Complaint  Patient presents with  . Chest Pain  . Leg Swelling    HPI [name] is a 58 y.o. male with a history of homelessness, acute on chronic combined systolic and diastolic CHF, sleep apnea, schizophrenia, neuropathy, HTN, cocaine use disorder, and diabetes mellitus who presents to the emergency department by EMS with a chief complaint of bilateral lower extremity swelling, groin and abdominal swelling, and chest pain.  He also reports some mild shortness of breath and states "there is fluid around my lungs." he reports this is been constant worsening over the last few days.  He is supposed to be taking Lasix, but reports that he has not filled the medication.  He was same for the same in the ER 2 times yesterday.  EMS reports the patient was also endorsing chest pain and he was given 1 of nitro and 324 mg of ASA.  The patient denies chest pain on my evaluation.  He reports he has not used cocaine since last night.  He has no urinary complaints, fever, chills, palpitations, numbness, weakness, abdominal pain, nausea, vomiting, or back pain.     The history is provided by the patient. No language interpreter was used.    Past Medical History:  Diagnosis Date  . CHF (congestive heart failure) (HCC)   . Chronic foot pain   . Cocaine abuse (HCC)   . Diabetes mellitus without complication (HCC)   . Hepatitis C    unsure   . Homelessness   . Hypertension   . Neuropathy   . Polysubstance abuse (HCC)   . Schizophrenia (HCC)   . Sleep apnea     Patient Active Problem List   Diagnosis Date Noted  . Urinary hesitancy   . Constipation   . Acute on chronic combined systolic and diastolic CHF (congestive heart failure) (HCC) 08/25/2018  . Acute respiratory failure with hypoxia (HCC) 08/25/2018  . Acute on chronic  combined systolic and diastolic congestive heart failure (HCC) 08/18/2018  . Tobacco use 08/18/2018  . 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 Homelessness 08/08/2018  . Smoker 08/08/2018  . CHF (congestive heart failure) (HCC) 07/15/2018  . Acute exacerbation of CHF (congestive heart failure) (HCC) 03/28/2018  . Prostate enlargement 03/16/2018  . Aortic atherosclerosis (HCC) 03/16/2018  . 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 Chronic foot pain   . Schizoaffective disorder, bipolar type (HCC) 09/30/2016  . Substance induced mood disorder (HCC) 03/13/2015  . Acute kidney failure (HCC) 01/26/2015  . Schizophrenia, paranoid type (HCC) 01/17/2015  . Suicidal ideation   . Drug hallucinosis (HCC) 10/08/2014  . Chronic paranoid schizophrenia (HCC) 09/07/2014  . 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 Urinary retention   . Cocaine use disorder, severe, dependence (HCC)   . Essential hypertension, benign 03/28/2013  . 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 MULTIPLE TOOTH EXTRACTIONS          Home Medications    Prior to Admission medications   Medication Sig Start Date End Date Taking? Authorizing Provider  ARIPiprazole (ABILIFY) 10 MG tablet Take 1 tablet (10 mg total) by mouth daily. 09/04/18  Yes Ramiro Harvest  V, MD  atorvastatin (LIPITOR) 40 MG tablet Take 1 tablet (40 mg total) by mouth daily. 09/04/18  Yes Rodolph Bong, MD  bisacodyl (DULCOLAX) 5 MG EC tablet Take 1 tablet (5 mg total) by mouth daily as needed for moderate constipation. 09/04/18  Yes Rodolph Bong, MD  carbamazepine (TEGRETOL) 200 MG tablet Take 0.5 tablets (100 mg total) by mouth 2 (two) times daily. 09/04/18  Yes Rodolph Bong, MD  carvedilol (COREG) 6.25 MG  tablet Take 1 tablet (6.25 mg total) by mouth 2 (two) times daily with a meal. 09/04/18  Yes Rodolph Bong, MD  furosemide (LASIX) 40 MG tablet Take 1 tablet (40 mg total) by mouth daily. 09/12/18  Yes Cardama, Amadeo Garnet, MD  Insulin Glargine (LANTUS) 100 UNIT/ML Solostar Pen Inject 14 Units into the skin daily. 09/04/18  Yes Rodolph Bong, MD  Insulin Pen Needle 29G X MISC Use with insulin 09/04/18  Yes Rodolph Bong, MD  lisinopril (PRINIVIL,ZESTRIL) 10 MG tablet Take 1 tablet (10 mg total) by mouth daily. 09/04/18  Yes Rodolph Bong, MD  nicotine (NICODERM CQ - DOSED IN MG/24 HOURS) 21 mg/24hr patch Place 1 patch (21 mg total) onto the skin daily. 09/04/18  Yes Rodolph Bong, MD  polyethylene glycol [facility] Ethelene Hal) packet Take 17 g by mouth 2 (two) times daily. 09/04/18  Yes Rodolph Bong, MD  Potassium Chloride ER 20 MEQ TBCR Take 20 mEq by mouth daily. 09/04/18  Yes Rodolph Bong, MD  senna-docusate (SENOKOT-S) 8.6-50 MG tablet Take 1 tablet by mouth 2 (two) times daily. 09/04/18  Yes Rodolph Bong, MD  tamsulosin [facility]) 0.4 MG CAPS capsule Take 1 capsule (0.4 mg total) by mouth daily after breakfast. 09/04/18  Yes Rodolph Bong, MD  aspirin EC 81 MG tablet Take 1 tablet (81 mg total) by mouth daily. Patient not taking: Reported on 08/18/2018 08/09/18 08/09/19  Barnetta Chapel, MD    Family History Family History  Problem Relation Age of Onset  . Hypertension Other   . Diabetes Other     Social History Social History   Tobacco Use  . Smoking status: Current Every Day Smoker    Packs/day: 1.00    Years: 20.00    Pack years: 20.00    Types: Cigarettes  . Smokeless tobacco: Current User  Substance Use Topics  . Alcohol use: Yes    Comment: Daily Drinker   . Drug use: Yes    Frequency: 7.0 times per week    Types: "Crack" cocaine, Cocaine, Marijuana     Allergies   Haldol [haloperidol]   Review of Systems Review of Systems   Constitutional: Negative for appetite change, chills and fever.  Respiratory: Positive for shortness of breath. Negative for cough.   Cardiovascular: Positive for leg swelling. Negative for chest pain and palpitations.  Gastrointestinal: Positive for abdominal distention. Negative for abdominal pain, diarrhea, nausea and vomiting.  Genitourinary: Negative for dysuria.  Musculoskeletal: Negative for back pain.  Skin: Negative for rash.  Allergic/Immunologic: Negative for immunocompromised state.  Neurological: Negative for headaches.  Psychiatric/Behavioral: Negative for confusion.   Physical Exam Updated Vital Signs BP (!) 168/74 (BP Location: Right Arm)   Pulse 94   Temp (!) 97.5 F (36.4 C) (Oral)   Resp 18   Ht  (1.778 m)   Wt 84 kg   SpO2 97%   BMI 26.57 kg/m   Physical Exam Vitals signs and nursing note  reviewed.  Constitutional:      Appearance: He is well-developed.  HENT:     Head: Normocephalic.  Eyes:     Conjunctiva/sclera: Conjunctivae normal.  Neck:     Musculoskeletal: Normal range of motion and neck supple.  Cardiovascular:     Rate and Rhythm: Normal rate and regular rhythm.     Pulses: Normal pulses.          Radial pulses are 2+ on the right side and 2+ on the left side.       Dorsalis pedis pulses are 2+ on the right side and 2+ on the left side.       Posterior tibial pulses are 2+ on the right side and 2+ on the left side.     Heart sounds: Normal heart sounds. No murmur. No friction rub. No gallop.   Pulmonary:     Effort: Pulmonary effort is normal. No respiratory distress.     Breath sounds: No stridor. No wheezing, rhonchi or rales.     Comments: Crackles at the bilateral bases. Chest:     Chest wall: No tenderness.  Abdominal:     General: There is no distension.     Palpations: Abdomen is soft. There is no mass.     Tenderness: There is no abdominal tenderness. There is no right CVA tenderness, left CVA tenderness, guarding or  rebound.     Hernia: No hernia is present.     Comments: Abdomen is mildly distended, but soft.  Genitourinary:    Comments: Scrotum is moderately edematous, but not firm.  No warmth, redness, wounds, or drainage from the area. Musculoskeletal:     Right lower leg: Edema present.     Left lower leg: Edema present.  Skin:    General: Skin is warm and dry.  Neurological:     Mental Status: He is alert.  Psychiatric:        Behavior: Behavior normal.      ED Treatments / Results  Labs (all labs ordered are listed, but only abnormal results are displayed) Labs Reviewed  BASIC METABOLIC PANEL - Abnormal; Notable for the following components:      Result Value   Glucose, Bld 275 (*)    BUN 21 (*)    All other components within normal limits  CBC WITH DIFFERENTIAL/PLATELET - Abnormal; Notable for the following components:   RBC 4.14 (*)    Hemoglobin 10.2 (*)    HCT 34.5 (*)    MCH 24.6 (*)    MCHC 29.6 (*)    RDW 16.8 (*)    Neutro Abs 8.1 (*)    All other components within normal limits  BRAIN NATRIURETIC PEPTIDE - Abnormal; Notable for the following components:   B Natriuretic Peptide 1,247.0 (*)    All other components within normal limits  I-STAT TROPONIN, ED    EKG None  Radiology Dg Chest 2 View  Result Date: 09/15/2018 CLINICAL DATA:  Chest pain EXAM: CHEST - 2 VIEW COMPARISON:  09/13/2018 FINDINGS: Cardiomegaly with vascular congestion. Possible early interstitial edema. No confluent opacities or effusions. No acute bony abnormality. IMPRESSION: Cardiomegaly with vascular congestion and possible early interstitial edema. Electronically Signed   By: Charlett Nose M.D.   On: 09/15/2018 03:04    Procedures Procedures (including critical care time)  Medications Ordered in ED Medications  furosemide (LASIX) injection 40 mg (40 mg Intravenous Given 09/15/18 0357)     Initial Impression / Assessment and Plan / ED Course  I have reviewed the triage vital signs and  the nursing notes.  Pertinent labs & imaging results that were available during my care of the patient were reviewed by me and considered in my medical decision making (see chart for details).        58 year old male with a history of homelessness, acute on chronic combined systolic and diastolic CHF, sleep apnea, schizophrenia, neuropathy, HTN, cocaine use disorder, and diabetes mellitus with 39 ER visits in the last 6 months.  Patient is presenting with peripheral edema and mild shortness of breath and scrotal swelling, which has been ongoing for the last few days.  He has been evaluated in the ER for the same 2 additional times in the last 2 days.  He has no chest pain at this time and has not used cocaine since last night.  Chest x-ray with interstitial edema.  BNP is 1247, unchanged from previous.  CBC unchanged from previous.  Troponin is negative.  Glucose at 275 with normal anion gap.  The patient appears to be at his baseline.  He has not been taking his home Lasix.  He has been given a dose in the ER.  He is requesting a sandwich and has been eating and drinking without difficulty.  No changes since yesterday.  He had an SaO2 documented at 89% on arrival, but on my evaluation the patient maintained SaO2 of 92 to 95% on room air at rest and was ambulated through the department with an SaO2 of 91%.  He has no respiratory distress.  At this time, I feel that no further urgent or emergent work-up is indicated.  I have discussed that the patient should resume his home Lasix.  Strict return precautions given.  He appears reasonably screened and/or stabilized for discharge.  I doubt any other medical condition or other [facility] requiring further screening, evaluation, or treatment in the ED is indicated at this time.  He is hemodynamically stable and in no acute distress.  Safe for discharge to home with outpatient follow-up at this time.  Final Clinical Impressions(s) / ED Diagnoses   Final diagnoses:   Scrotal edema  Peripheral edema    ED Discharge Orders    None       Barkley BoardsMcDonald, Simmie Garin A, PA-C 09/15/18 0909    Nira Connardama, Pedro Eduardo, MD 09/16/18 [phone]

## 2018-09-17 ENCOUNTER — Other Ambulatory Visit: Payer: Self-pay

## 2018-09-17 ENCOUNTER — Encounter (HOSPITAL_COMMUNITY): Payer: Self-pay | Admitting: General Practice

## 2018-09-17 ENCOUNTER — Inpatient Hospital Stay (HOSPITAL_COMMUNITY): Payer: Medicare Other

## 2018-09-17 ENCOUNTER — Emergency Department (HOSPITAL_COMMUNITY): Payer: Medicare Other

## 2018-09-17 ENCOUNTER — Inpatient Hospital Stay: Payer: Self-pay | Admitting: Critical Care Medicine

## 2018-09-17 ENCOUNTER — Inpatient Hospital Stay (HOSPITAL_COMMUNITY)
Admission: EM | Admit: 2018-09-17 | Discharge: 2018-09-18 | DRG: 291 | Disposition: A | Discharge status: Home / Self Care | Payer: Medicare Other | Attending: Family Medicine | Admitting: Family Medicine

## 2018-09-17 DIAGNOSIS — F17210 Nicotine dependence, cigarettes, uncomplicated: Secondary | ICD-10-CM | POA: Diagnosis present

## 2018-09-17 DIAGNOSIS — Z5900 Homelessness unspecified: Secondary | ICD-10-CM

## 2018-09-17 DIAGNOSIS — R451 Restlessness and agitation: Secondary | ICD-10-CM | POA: Diagnosis present

## 2018-09-17 DIAGNOSIS — F1424 Cocaine dependence with cocaine-induced mood disorder: Secondary | ICD-10-CM | POA: Diagnosis present

## 2018-09-17 DIAGNOSIS — R0902 Hypoxemia: Secondary | ICD-10-CM

## 2018-09-17 DIAGNOSIS — E876 Hypokalemia: Secondary | ICD-10-CM | POA: Diagnosis present

## 2018-09-17 DIAGNOSIS — I110 Hypertensive heart disease with heart failure: Principal | ICD-10-CM | POA: Diagnosis present

## 2018-09-17 DIAGNOSIS — J9601 Acute respiratory failure with hypoxia: Secondary | ICD-10-CM | POA: Diagnosis present

## 2018-09-17 DIAGNOSIS — R079 Chest pain, unspecified: Secondary | ICD-10-CM | POA: Diagnosis not present

## 2018-09-17 DIAGNOSIS — R609 Edema, unspecified: Secondary | ICD-10-CM | POA: Diagnosis not present

## 2018-09-17 DIAGNOSIS — E1165 Type 2 diabetes mellitus with hyperglycemia: Secondary | ICD-10-CM | POA: Diagnosis present

## 2018-09-17 DIAGNOSIS — F200 Paranoid schizophrenia: Secondary | ICD-10-CM | POA: Diagnosis not present

## 2018-09-17 DIAGNOSIS — K0889 Other specified disorders of teeth and supporting structures: Secondary | ICD-10-CM | POA: Diagnosis present

## 2018-09-17 DIAGNOSIS — N5089 Other specified disorders of the male genital organs: Secondary | ICD-10-CM | POA: Diagnosis present

## 2018-09-17 DIAGNOSIS — I10 Essential (primary) hypertension: Secondary | ICD-10-CM | POA: Diagnosis present

## 2018-09-17 DIAGNOSIS — I5043 Acute on chronic combined systolic (congestive) and diastolic (congestive) heart failure: Secondary | ICD-10-CM | POA: Diagnosis not present

## 2018-09-17 DIAGNOSIS — Z794 Long term (current) use of insulin: Secondary | ICD-10-CM | POA: Diagnosis not present

## 2018-09-17 DIAGNOSIS — E119 Type 2 diabetes mellitus without complications: Secondary | ICD-10-CM | POA: Diagnosis not present

## 2018-09-17 DIAGNOSIS — F1420 Cocaine dependence, uncomplicated: Secondary | ICD-10-CM

## 2018-09-17 DIAGNOSIS — R45851 Suicidal ideations: Secondary | ICD-10-CM

## 2018-09-17 DIAGNOSIS — N400 Enlarged prostate without lower urinary tract symptoms: Secondary | ICD-10-CM | POA: Diagnosis present

## 2018-09-17 DIAGNOSIS — I5033 Acute on chronic diastolic (congestive) heart failure: Secondary | ICD-10-CM | POA: Diagnosis present

## 2018-09-17 DIAGNOSIS — I5023 Acute on chronic systolic (congestive) heart failure: Secondary | ICD-10-CM | POA: Diagnosis present

## 2018-09-17 DIAGNOSIS — E785 Hyperlipidemia, unspecified: Secondary | ICD-10-CM | POA: Diagnosis present

## 2018-09-17 DIAGNOSIS — F1994 Other psychoactive substance use, unspecified with psychoactive substance-induced mood disorder: Secondary | ICD-10-CM | POA: Diagnosis present

## 2018-09-17 HISTORY — DX: Chronic combined systolic (congestive) and diastolic (congestive) heart failure: I50.42

## 2018-09-17 LAB — CBC WITH DIFFERENTIAL/PLATELET
Abs Immature Granulocytes: 0.02 10*3/uL (ref 0.00–0.07)
BASOS ABS: 0 10*3/uL (ref 0.0–0.1)
Basophils Relative: 0 %
Eosinophils Absolute: 0 10*3/uL (ref 0.0–0.5)
Eosinophils Relative: 0 %
HCT: 34.9 % — ABNORMAL LOW (ref 39.0–52.0)
Hemoglobin: 10.2 g/dL — ABNORMAL LOW (ref 13.0–17.0)
Immature Granulocytes: 0 %
Lymphocytes Relative: 8 %
Lymphs Abs: 0.7 10*3/uL (ref 0.7–4.0)
MCH: 24.6 pg — ABNORMAL LOW (ref 26.0–34.0)
MCHC: 29.2 g/dL — ABNORMAL LOW (ref 30.0–36.0)
MCV: 84.1 fL (ref 80.0–100.0)
Monocytes Absolute: 0.7 10*3/uL (ref 0.1–1.0)
Monocytes Relative: 8 %
NEUTROS ABS: 7.7 10*3/uL (ref 1.7–7.7)
Neutrophils Relative %: 84 %
Platelets: 286 10*3/uL (ref 150–400)
RBC: 4.15 MIL/uL — AB (ref 4.22–5.81)
RDW: 17 % — ABNORMAL HIGH (ref 11.5–15.5)
WBC: 9.2 10*3/uL (ref 4.0–10.5)
nRBC: 0 % (ref 0.0–0.2)

## 2018-09-17 LAB — BRAIN NATRIURETIC PEPTIDE: B Natriuretic Peptide: 1224 pg/mL — ABNORMAL HIGH (ref 0.0–100.0)

## 2018-09-17 LAB — GLUCOSE, CAPILLARY
GLUCOSE-CAPILLARY: 89 mg/dL (ref 70–99)
Glucose-Capillary: 337 mg/dL — ABNORMAL HIGH (ref 70–99)
Glucose-Capillary: 38 mg/dL — CL (ref 70–99)
Glucose-Capillary: 43 mg/dL — CL (ref 70–99)

## 2018-09-17 LAB — BASIC METABOLIC PANEL
ANION GAP: 8 (ref 5–15)
BUN: 15 mg/dL (ref 6–20)
CALCIUM: 8.5 mg/dL — AB (ref 8.9–10.3)
CO2: 28 mmol/L (ref 22–32)
Chloride: 102 mmol/L (ref 98–111)
Creatinine, Ser: 1.08 mg/dL (ref 0.61–1.24)
GFR calc non Af Amer: >60 mL/min (ref >60)
Glucose, Bld: 339 mg/dL — ABNORMAL HIGH (ref 70–99)
Potassium: 3.3 mmol/L — ABNORMAL LOW (ref 3.5–5.1)
Sodium: 138 mmol/L (ref 135–145)

## 2018-09-17 LAB — HEPATIC FUNCTION PANEL
ALT: 20 U/L (ref 0–44)
AST: 32 U/L (ref 15–41)
Albumin: 2.5 g/dL — ABNORMAL LOW (ref 3.5–5.0)
Alkaline Phosphatase: 114 U/L (ref 38–126)
Bilirubin, Direct: 0.2 mg/dL (ref 0.0–0.2)
Indirect Bilirubin: 0.5 mg/dL (ref 0.3–0.9)
TOTAL PROTEIN: 5.8 g/dL — AB (ref 6.5–8.1)
Total Bilirubin: 0.7 mg/dL (ref 0.3–1.2)

## 2018-09-17 LAB — SALICYLATE LEVEL

## 2018-09-17 LAB — ETHANOL: Alcohol, Ethyl (B): <10 mg/dL (ref <10)

## 2018-09-17 LAB — RAPID URINE DRUG SCREEN, HOSP PERFORMED
Amphetamines: NOT DETECTED
Barbiturates: NOT DETECTED
Benzodiazepines: NOT DETECTED
Cocaine: POSITIVE — AB
Opiates: NOT DETECTED
Tetrahydrocannabinol: NOT DETECTED

## 2018-09-17 LAB — I-STAT TROPONIN, ED: Troponin i, poc: 0.04 ng/mL (ref 0.00–0.08)

## 2018-09-17 LAB — CBG MONITORING, ED: Glucose-Capillary: 319 mg/dL — ABNORMAL HIGH (ref 70–99)

## 2018-09-17 LAB — ACETAMINOPHEN LEVEL: Acetaminophen (Tylenol), Serum: <10 ug/mL — ABNORMAL LOW (ref 10–30)

## 2018-09-17 LAB — CARBAMAZEPINE LEVEL, TOTAL: Carbamazepine Lvl: <2 ug/mL — ABNORMAL LOW (ref 4.0–12.0)

## 2018-09-17 MED ORDER — CARVEDILOL 6.25 MG PO TABS
6.2500 mg | ORAL_TABLET | Freq: Two times a day (BID) | ORAL | Status: DC
  Administered 2018-09-17: 6.25 mg via ORAL
  Filled 2018-09-17: qty 1

## 2018-09-17 MED ORDER — INSULIN GLARGINE 100 UNIT/ML SOLOSTAR PEN: 14.0000 [IU] | PEN_INJECTOR | Freq: Every day | SUBCUTANEOUS | Status: DC

## 2018-09-17 MED ORDER — LISINOPRIL 10 MG PO TABS
10.0000 mg | ORAL_TABLET | Freq: Every day | ORAL | Status: DC
  Administered 2018-09-17 – 2018-09-18 (×2): 10 mg via ORAL
  Filled 2018-09-17 (×2): qty 1

## 2018-09-17 MED ORDER — BISACODYL 5 MG PO TBEC: 5.0000 mg | DELAYED_RELEASE_TABLET | Freq: Every day | ORAL | Status: DC | PRN

## 2018-09-17 MED ORDER — POLYETHYLENE GLYCOL 3350 17 G PO PACK
17.0000 g | PACK | Freq: Two times a day (BID) | ORAL | Status: DC
  Administered 2018-09-17: 17 g via ORAL
  Filled 2018-09-17 (×2): qty 1

## 2018-09-17 MED ORDER — TAMSULOSIN HCL 0.4 MG PO CAPS
0.4000 mg | ORAL_CAPSULE | Freq: Every day | ORAL | Status: DC
  Administered 2018-09-18: 0.4 mg via ORAL
  Filled 2018-09-17: qty 1

## 2018-09-17 MED ORDER — ARIPIPRAZOLE 5 MG PO TABS
10.0000 mg | ORAL_TABLET | Freq: Every day | ORAL | Status: DC
  Administered 2018-09-17: 10 mg via ORAL
  Filled 2018-09-17: qty 2

## 2018-09-17 MED ORDER — POTASSIUM CHLORIDE CRYS ER 20 MEQ PO TBCR
20.0000 meq | EXTENDED_RELEASE_TABLET | Freq: Two times a day (BID) | ORAL | Status: DC
  Administered 2018-09-17 – 2018-09-18 (×3): 20 meq via ORAL
  Filled 2018-09-17 (×3): qty 1

## 2018-09-17 MED ORDER — ONDANSETRON HCL 4 MG/2ML IJ SOLN: 4.0000 mg | Freq: Four times a day (QID) | INTRAMUSCULAR | Status: DC | PRN

## 2018-09-17 MED ORDER — SODIUM CHLORIDE 0.9% FLUSH
3.0000 mL | Freq: Two times a day (BID) | INTRAVENOUS | Status: DC
  Administered 2018-09-17 – 2018-09-18 (×3): 3 mL via INTRAVENOUS

## 2018-09-17 MED ORDER — NICOTINE 21 MG/24HR TD PT24
21.0000 mg | MEDICATED_PATCH | Freq: Every day | TRANSDERMAL | Status: DC
  Administered 2018-09-17 – 2018-09-18 (×2): 21 mg via TRANSDERMAL
  Filled 2018-09-17 (×2): qty 1

## 2018-09-17 MED ORDER — DEXTROSE 50 % IV SOLN
25.0000 mL | INTRAVENOUS | Status: AC
  Administered 2018-09-17: 25 mL via INTRAVENOUS

## 2018-09-17 MED ORDER — INSULIN GLARGINE 100 UNIT/ML ~~LOC~~ SOLN
14.0000 [IU] | Freq: Every day | SUBCUTANEOUS | Status: DC
  Administered 2018-09-17 – 2018-09-18 (×2): 14 [IU] via SUBCUTANEOUS
  Filled 2018-09-17 (×2): qty 0.14

## 2018-09-17 MED ORDER — OXYCODONE-ACETAMINOPHEN 5-325 MG PO TABS
1.0000 | ORAL_TABLET | ORAL | Status: DC | PRN
  Administered 2018-09-17 (×2): 1 via ORAL
  Administered 2018-09-18: 2 via ORAL
  Filled 2018-09-17: qty 2
  Filled 2018-09-17 (×2): qty 1

## 2018-09-17 MED ORDER — HYDRALAZINE HCL 20 MG/ML IJ SOLN
5.0000 mg | INTRAMUSCULAR | Status: DC | PRN
  Administered 2018-09-17: 5 mg via INTRAVENOUS
  Filled 2018-09-17: qty 1

## 2018-09-17 MED ORDER — SODIUM CHLORIDE 0.9% FLUSH: 3.0000 mL | INTRAVENOUS | Status: DC | PRN

## 2018-09-17 MED ORDER — ATORVASTATIN CALCIUM 40 MG PO TABS
40.0000 mg | ORAL_TABLET | Freq: Every day | ORAL | Status: DC
  Administered 2018-09-17: 40 mg via ORAL
  Filled 2018-09-17: qty 1

## 2018-09-17 MED ORDER — DEXTROSE 50 % IV SOLN
INTRAVENOUS | Status: AC
  Filled 2018-09-17: qty 50

## 2018-09-17 MED ORDER — CARVEDILOL 6.25 MG PO TABS
6.2500 mg | ORAL_TABLET | Freq: Two times a day (BID) | ORAL | Status: DC
  Administered 2018-09-18: 6.25 mg via ORAL
  Filled 2018-09-17: qty 1

## 2018-09-17 MED ORDER — FUROSEMIDE 10 MG/ML IJ SOLN
40.0000 mg | Freq: Once | INTRAMUSCULAR | Status: AC
  Administered 2018-09-17: 40 mg via INTRAVENOUS
  Filled 2018-09-17: qty 4

## 2018-09-17 MED ORDER — SODIUM CHLORIDE 0.9 % IV SOLN: 250.0000 mL | INTRAVENOUS | Status: DC | PRN

## 2018-09-17 MED ORDER — DIPHENHYDRAMINE-ZINC ACETATE 2-0.1 % EX CREA
TOPICAL_CREAM | Freq: Three times a day (TID) | CUTANEOUS | Status: DC | PRN
  Filled 2018-09-17: qty 28

## 2018-09-17 MED ORDER — ASPIRIN EC 81 MG PO TBEC: 81.0000 mg | DELAYED_RELEASE_TABLET | Freq: Every day | ORAL | Status: DC

## 2018-09-17 MED ORDER — FUROSEMIDE 10 MG/ML IJ SOLN
40.0000 mg | Freq: Two times a day (BID) | INTRAMUSCULAR | Status: DC
  Administered 2018-09-17 – 2018-09-18 (×2): 40 mg via INTRAVENOUS
  Filled 2018-09-17 (×2): qty 4

## 2018-09-17 MED ORDER — INSULIN ASPART 100 UNIT/ML ~~LOC~~ SOLN: 0.0000 [IU] | Freq: Every day | SUBCUTANEOUS | Status: DC

## 2018-09-17 MED ORDER — GLUCOSE 40 % PO GEL: 2.0000 | ORAL | Status: DC

## 2018-09-17 MED ORDER — ENOXAPARIN SODIUM 40 MG/0.4ML ~~LOC~~ SOLN
40.0000 mg | Freq: Every day | SUBCUTANEOUS | Status: DC
  Administered 2018-09-17 – 2018-09-18 (×2): 40 mg via SUBCUTANEOUS
  Filled 2018-09-17 (×2): qty 0.4

## 2018-09-17 MED ORDER — INSULIN ASPART 100 UNIT/ML ~~LOC~~ SOLN
0.0000 [IU] | Freq: Three times a day (TID) | SUBCUTANEOUS | Status: DC
  Administered 2018-09-17 (×2): 11 [IU] via SUBCUTANEOUS
  Administered 2018-09-18: 3 [IU] via SUBCUTANEOUS

## 2018-09-17 MED ORDER — ACETAMINOPHEN 325 MG PO TABS: 650.0000 mg | ORAL_TABLET | ORAL | Status: DC | PRN

## 2018-09-17 MED ORDER — CARBAMAZEPINE 200 MG PO TABS: 100.0000 mg | ORAL_TABLET | Freq: Two times a day (BID) | ORAL | Status: DC

## 2018-09-17 MED ORDER — SENNOSIDES-DOCUSATE SODIUM 8.6-50 MG PO TABS
1.0000 | ORAL_TABLET | Freq: Two times a day (BID) | ORAL | Status: DC
  Administered 2018-09-17 – 2018-09-18 (×2): 1 via ORAL
  Filled 2018-09-17 (×2): qty 1

## 2018-09-17 NOTE — ED Notes (Signed)
ED TO INPATIENT HANDOFF REPORT  ED Nurse Name and Phone #: Joni Reining @ [phone]  S Name/Age/Gender [name] 58 y.o. male Room/Bed: 017C/017C  Code Status   Code Status: Prior  Home/SNF/Other Patient is homeless Patient oriented to: self, place, time and situation Is this baseline? Yes   Triage Complete: Triage complete  Chief Complaint SOB; swelling   Triage Note Patient c/o CP, SOB, penile/scrotal swelling, anal pain and tooth pain. States smoked crack cocaine 2 hours ago.  Given 324 asa and 1 nitro by EMS. Per EMS, pt had O2 sat of 80% and was placed on 15L O2 via [place].   Allergies Allergies  Allergen Reactions  . Haldol [Haloperidol] Other (See Comments)    Muscle spasms, loss of voluntary movement. However, pt has taken Thorazine on multiple occasions with no adverse effects.     Level of Care/Admitting Diagnosis ED Disposition    ED Disposition Condition Comment   Admit  Hospital Area: [facility] [[id number]]  Level of Care: Telemetry Cardiac [103]  Diagnosis: Acute on chronic systolic (congestive) heart failure [facility]) [[id number]]  Admitting Physician: Lorretta Harp [[id number]]  Attending Physician: Lorretta Harp [phone]  Estimated length of stay: past midnight tomorrow  Certification:: I certify this patient will need inpatient services for at least 2 midnights  PT Class (Do Not Modify): Inpatient [101]  PT Acc Code (Do Not Modify): Private [1]       B Medical/Surgery History Past Medical History:  Diagnosis Date  . CHF (congestive heart failure) (HCC)   . Chronic foot pain   . Cocaine abuse (HCC)   . Diabetes mellitus without complication (HCC)   . Hepatitis C    unsure   . Homelessness   . Hypertension   . Neuropathy   . Polysubstance abuse (HCC)   . Schizophrenia (HCC)   . Sleep apnea    Past Surgical History:  Procedure Laterality Date  . MULTIPLE TOOTH EXTRACTIONS       A IV Location/Drains/Wounds Patient  Lines/Drains/Airways Status   Active Line/Drains/Airways    Name:   Placement date:   Placement time:   Site:   Days:   Peripheral IV 09/17/18 Left Antecubital   09/17/18    0544    Antecubital   less than 1   External Urinary Catheter   09/17/18    0520    -   less than 1          Intake/Output Last 24 hours No intake or output data in the 24 hours ending 09/17/18 9562  Labs/Imaging Results for orders placed or performed during the hospital encounter of 09/17/18 (from the past 48 hour(s))  CBC with Differential     Status: Abnormal   Collection Time: 09/17/18  5:22 AM  Result Value Ref Range   WBC 9.2 4.0 - 10.5 K/uL   RBC 4.15 (L) 4.22 - 5.81 MIL/uL   Hemoglobin 10.2 (L) 13.0 - 17.0 g/dL   HCT 13.0 (L) 86.5 - 78.4 %   MCV 84.1 80.0 - 100.0 fL   MCH 24.6 (L) 26.0 - 34.0 pg   MCHC 29.2 (L) 30.0 - 36.0 g/dL   RDW 69.6 (H) 29.5 - 28.4 %   Platelets 286 150 - 400 K/uL   nRBC 0.0 0.0 - 0.2 %   Neutrophils Relative % 84 %   Neutro Abs 7.7 1.7 - 7.7 K/uL   Lymphocytes Relative 8 %   Lymphs Abs 0.7 0.7 - 4.0 K/uL  Monocytes Relative 8 %   Monocytes Absolute 0.7 0.1 - 1.0 K/uL   Eosinophils Relative 0 %   Eosinophils Absolute 0.0 0.0 - 0.5 K/uL   Basophils Relative 0 %   Basophils Absolute 0.0 0.0 - 0.1 K/uL   Immature Granulocytes 0 %   Abs Immature Granulocytes 0.02 0.00 - 0.07 K/uL    Comment: Performed at [facility], [address] [place], [place] [address]  I-stat troponin, ED     Status: None   Collection Time: 09/17/18  5:23 AM  Result Value Ref Range   Troponin i, poc 0.04 0.00 - 0.08 ng/mL   Comment 3            Comment: Due to the release kinetics of cTnI, a negative result within the first hours of the onset of symptoms does not rule out myocardial infarction with certainty. If myocardial infarction is still suspected, repeat the test at appropriate intervals.   CBG monitoring, ED     Status: Abnormal   Collection Time: 09/17/18  5:30 AM  Result  Value Ref Range   Glucose-Capillary 319 (H) 70 - 99 mg/dL  Acetaminophen level     Status: Abnormal   Collection Time: 09/17/18  5:30 AM  Result Value Ref Range   Acetaminophen (Tylenol), Serum <10 (L) 10 - 30 ug/mL    Comment: (NOTE) Therapeutic concentrations vary significantly. A range of 10-30 ug/mL  may be an effective concentration for many patients. However, some  are best treated at concentrations outside of this range. Acetaminophen concentrations >150 ug/mL at 4 hours after ingestion  and >50 ug/mL at 12 hours after ingestion are often associated with  toxic reactions. Performed at [facility], [address] [place], [place] [address]   Salicylate level     Status: None   Collection Time: 09/17/18  5:30 AM  Result Value Ref Range   Salicylate Lvl <7.0 2.8 - 30.0 mg/dL    Comment: Performed at [facility], [address] [place], [place] [address]  Ethanol     Status: None   Collection Time: 09/17/18  5:30 AM  Result Value Ref Range   Alcohol, Ethyl (B) <10 <10 mg/dL    Comment: (NOTE) Lowest detectable limit for serum alcohol is 10 mg/dL. For medical purposes only. Performed at [facility], [address], [place], [place] [address]    Dg Chest Portable 1 View  Result Date: 09/17/2018 CLINICAL DATA:  Chest pain.  Shortness of breath. EXAM: PORTABLE CHEST 1 VIEW COMPARISON:  09/15/2018. FINDINGS: Cardiomegaly. Diffuse mild interstitial prominence again noted. Slight progressed from prior exam. Findings suggest CHF. Pneumonitis can not be mild left mid lung field subsegmental atelectasis/scarring. Tiny right pleural effusion. No pneumothorax. No acute bony abnormality. Degenerative change thoracic spine. IMPRESSION: Cardiomegaly with diffuse mild interstitial prominence again noted consistent with CHF. Pneumonitis can not be excluded. There is slight progression from prior exam. Tiny right pleural effusion. Electronically Signed   By: Maisie Fus  Register    On: 09/17/2018 05:45    Pending Labs Unresulted Labs (From admission, onward)    Start     Ordered   09/17/18 0529  Rapid urine drug screen (hospital performed)  ONCE - STAT,   R     09/17/18 0528   09/17/18 0522  Hepatic function panel  Once,   STAT     09/17/18 0522   09/17/18 0520  Basic metabolic panel  ONCE - STAT,  STAT     09/17/18 0520   09/17/18 0520  Brain natriuretic peptide  ONCE - STAT,   R     09/17/18 0520          Vitals/Pain Today's Vitals   09/17/18 0521 09/17/18 0522 09/17/18 0530 09/17/18 0603  BP:  (!) 160/98 (!) 195/94   Pulse:  (!) 103 96   Resp:  (!) 22 (!) 24   Temp:  98 F (36.7 C)    TempSrc:  Oral    SpO2:  95% 94%   Weight: 85 kg   96.3 kg  Height: 5\' 10"  (1.778 m)   5\' 10"  (1.778 m)  PainSc: 10-Worst pain ever       Isolation Precautions No active isolations  Medications Medications  hydrALAZINE (APRESOLINE) injection 5 mg (has no administration in time range)  furosemide (LASIX) injection 40 mg (40 mg Intravenous Given 09/17/18 7579)    Mobility walks Low fall risk   Focused Assessments Cardiac Assessment Handoff:  Cardiac Rhythm: Normal sinus rhythm Lab Results  Component Value Date   CKTOTAL 245 01/27/2015   CKMB 1.4 06/01/2008   TROPONINI 0.03 (HH) 09/02/2018   Lab Results  Component Value Date   DDIMER  05/31/2008    0.36        AT THE INHOUSE ESTABLISHED CUTOFF VALUE OF 0.48 ug/mL FEU, THIS ASSAY HAS BEEN DOCUMENTED IN THE LITERATURE TO HAVE   Does the Patient currently have chest pain? No  , Pulmonary Assessment Handoff:  Lung sounds: L Breath Sounds: Diminished R Breath Sounds: Diminished O2 Device: Room Air        R Recommendations: See Admitting Provider Note  Report given to:   Additional Notes: Patient testicles/scrotum swollen. Was released from jail earlier and smoked crack cocaine; now c/o SI. Staffing notified for sitter.  Fred weighed in ED so weight is correct in chart.

## 2018-09-17 NOTE — Progress Notes (Deleted)
   Subjective:    Patient ID: [name], male    DOB: [birth year], 58 y.o.   MRN: [id number]  HPI    Review of Systems     Objective:   Physical Exam        Assessment & Plan:

## 2018-09-17 NOTE — ED Notes (Signed)
MD notified that pt c/o SI.

## 2018-09-17 NOTE — ED Triage Notes (Signed)
Patient c/o CP, SOB, penile/scrotal swelling, anal pain and tooth pain. States smoked crack cocaine 2 hours ago.  Given 324 asa and 1 nitro by EMS. Per EMS, pt had O2 sat of 80% and was placed on 15L O2 via [place].

## 2018-09-17 NOTE — ED Notes (Signed)
Staffing notified that we need sitter; states will have one available at 07:00

## 2018-09-17 NOTE — Progress Notes (Addendum)
Noted patient has made 37 ER visits and 9 hospital admissions in 6 months  09/17/2018 - CM following patient for progression of care; Very, very difficult case; patient has been known to show a violent temperament when he does not get what he wants; all of his needs have been addressed previous admissions by CM and SW; patient continues to use cocaine/ crack which is a huge barrier; patient made threating comments to nursing staff in the past; Security would round on the patient frequently for pt/ staff safety ; B Kavi Almquist RN,MHA,BSN  Action/Plan: 08/25/2018- Patient is very well known to me from previous admission; CM will continue to follow for progression of care; pt is independent of all of his ADL; B Shelba Flake   08/18/2018- Action/Plan: Patient admitted from [facility]; [facility]:YIFOYD, [name], NP; has private insurance with Medicaid with prescription drug coverage; SW following/ knows patient from previous admission; he has list of group homes and shelters in the area. Patient does have an bad temperament at times. CM following for progression of care. 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 Attending MD at discharge please send scripts to [facility] pharmacy.CM will continue to follow for progression of care.

## 2018-09-17 NOTE — ED Notes (Signed)
Per Dr. Elesa Massed D/C sitter, patient has history of malingering due to homelessness. Usually states he is SI to get a place to stay overnight and food.

## 2018-09-17 NOTE — H&P (Addendum)
History and Physical:    [name]   BLT:[id number] DOB: 1960/08/31 DOA: 09/17/2018  Referring MD/provider: Dr. Leonides Schanz PCP: Patient, No Pcp Per   Patient coming from: Streets/homeless.  Chief Complaint: Scrotal pain, SOB, cough`  COVID RISK: Low, wore surgical mask due to active cough, attributable to CHF. No isolation felt to be needed at this time.  History of Present Illness:   [name] is an 58 y.o. male with a PMH of homelessness/poor access to healthcare, type 2 diabetes, polysubstance abuse, schizophrenia, hypertension, and chronic systolic and diastolic CHF with last EF calculated at 25-30% on 09/02/2018, who presents today with a chief complaint of a 1 week h/o worsening scrotal pain and a "sore "under his scrotum, shortness of breath, and cough productive of clear frothy mucus. Also report mouth/dental pain. He has had 37 visits here in the past 6 months.  Patient claims that he took his medicines as prescribed up until yesterday.  He continues to be homeless and has been living out on the streets per his report.  ED Course:  The patient was given 324 ASA and 1 nitro by EMS. Per EMS, pt had O2 sat of 80% and was placed on 15L O2 via [place].  In the ED, he was placed on a nonrebreather mask with improvement of his oxygen saturation to the mid 90s.  Initial blood sugar 508.  Chest x-ray showed interstitial pulmonary edema and the patient was given 40 mg of IV Lasix.  ROS:   Review of Systems  Constitutional: Positive for malaise/fatigue. Negative for fever.  HENT: Positive for congestion. Negative for sore throat.   Eyes: Negative.   Respiratory: Positive for cough, sputum production and shortness of breath.   Cardiovascular: Positive for chest pain and leg swelling.  Gastrointestinal: Negative.   Genitourinary:       Scrotal pain  Musculoskeletal: Negative.   Skin: Positive for itching.  Neurological: Negative.   Endo/Heme/Allergies: Negative.    Psychiatric/Behavioral: Positive for depression and substance abuse.    Past Medical History:   Past Medical History:  Diagnosis Date  . Chronic foot pain   . Cocaine abuse ([place])   . Diabetes mellitus without complication ([place])   . Hepatitis C    unsure   . Homelessness   . Hypertension   . Neuropathy   . Polysubstance abuse ([place])   . Schizophrenia ([place])   . Sleep apnea   . Systolic and diastolic CHF, chronic ([place])     Past Surgical History:   Past Surgical History:  Procedure Laterality Date  . MULTIPLE TOOTH EXTRACTIONS      Social History:   Social History   Socioeconomic History  . Marital status: Single    Spouse name: Not on file  . Number of children: Not on file  . Years of education: Not on file  . Highest education level: Not on file  Occupational History  . Not on file  Social Needs  . Financial resource strain: Not on file  . Food insecurity:    Worry: Not on file    Inability: Not on file  . Transportation needs:    Medical: Not on file    Non-medical: Not on file  Tobacco Use  . Smoking status: Current Every Day Smoker    Packs/day: 1.00    Years: 20.00    Pack years: 20.00    Types: Cigarettes  . Smokeless tobacco: Current User  Substance and Sexual Activity  .  Alcohol use: Yes    Comment: Daily Drinker   . Drug use: Yes    Frequency: 7.0 times per week    Types: "Crack" cocaine, Cocaine, Marijuana  . Sexual activity: Not Currently  Lifestyle  . Physical activity:    Days per week: Not on file    Minutes per session: Not on file  . Stress: Not on file  Relationships  . Social connections:    Talks on phone: Not on file    Gets together: Not on file    Attends religious service: Not on file    Active member of club or organization: Not on file    Attends meetings of clubs or organizations: Not on file    Relationship status: Not on file  . Intimate partner violence:    Fear of current or ex partner: Not on file    Emotionally  abused: Not on file    Physically abused: Not on file    Forced sexual activity: Not on file  Other Topics Concern  . Not on file  Social History Narrative   ** Merged History Encounter **        Allergies   Haldol [haloperidol]  Family history:   Family History  Problem Relation Age of Onset  . Hypertension Other   . Diabetes Other     Current Medications:   Prior to Admission medications   Medication Sig Start Date End Date Taking? Authorizing Provider  ARIPiprazole (ABILIFY) 10 MG tablet Take 1 tablet (10 mg total) by mouth daily. 09/04/18   Eugenie Filler, MD  aspirin EC 81 MG tablet Take 1 tablet (81 mg total) by mouth daily. Patient not taking: Reported on 08/18/2018 08/09/18 08/09/19  Dana Allan I, MD  atorvastatin (LIPITOR) 40 MG tablet Take 1 tablet (40 mg total) by mouth daily. 09/04/18   Eugenie Filler, MD  bisacodyl (DULCOLAX) 5 MG EC tablet Take 1 tablet (5 mg total) by mouth daily as needed for moderate constipation. 09/04/18   Eugenie Filler, MD  carbamazepine (TEGRETOL) 200 MG tablet Take 0.5 tablets (100 mg total) by mouth 2 (two) times daily. 09/04/18   Eugenie Filler, MD  carvedilol (COREG) 6.25 MG tablet Take 1 tablet (6.25 mg total) by mouth 2 (two) times daily with a meal. 09/04/18   Eugenie Filler, MD  furosemide (LASIX) 40 MG tablet Take 1 tablet (40 mg total) by mouth daily. 09/12/18   Cardama, Grayce Sessions, MD  Insulin Glargine (LANTUS) 100 UNIT/ML Solostar Pen Inject 14 Units into the skin daily. 09/04/18   Eugenie Filler, MD  Insulin Pen Needle 29G X 8MM MISC Use with insulin 09/04/18   Eugenie Filler, MD  lisinopril (PRINIVIL,ZESTRIL) 10 MG tablet Take 1 tablet (10 mg total) by mouth daily. 09/04/18   Eugenie Filler, MD  nicotine (NICODERM CQ - DOSED IN MG/24 HOURS) 21 mg/24hr patch Place 1 patch (21 mg total) onto the skin daily. 09/04/18   Eugenie Filler, MD  polyethylene glycol [facility]) packet Take 17 g by mouth 2  (two) times daily. 09/04/18   Eugenie Filler, MD  Potassium Chloride ER 20 MEQ TBCR Take 20 mEq by mouth daily. 09/04/18   Eugenie Filler, MD  senna-docusate (SENOKOT-S) 8.6-50 MG tablet Take 1 tablet by mouth 2 (two) times daily. 09/04/18   Eugenie Filler, MD  tamsulosin (FLOMAX) 0.4 MG CAPS capsule Take 1 capsule (0.4 mg total) by mouth daily after  breakfast. 09/04/18   Eugenie Filler, MD    Physical Exam:   Vitals:   09/17/18 0630 09/17/18 0700 09/17/18 0734 09/17/18 0751  BP: (!) 183/101 (!) 176/108 (!) 173/93 (!) 159/86  Pulse: 94 97  (!) 102  Resp: (!) 24 (!) 23  (!) 22  Temp:    98 F (36.7 C)  TempSrc:    Oral  SpO2: 100% 100%  97%  Weight:    94.8 kg  Height:    '5\' 9"'  (1.753 m)     Physical Exam: Blood pressure (!) 159/86, pulse (!) 102, temperature 98 F (36.7 C), temperature source Oral, resp. rate (!) 22, height '5\' 9"'  (1.753 m), weight 94.8 kg, SpO2 97 %. Gen: Cooperative but mildly distressed. Head: Normocephalic, atraumatic. Eyes: Pupils equal, round and reactive to light. Extraocular movements intact.  Sclerae nonicteric. No lid lag. Mouth: Oropharynx reveals moist mucous membranes. Dentition is extremely poor with multiple broken teeth, missing teeth and caries. Neck: Supple, no thyromegaly, no lymphadenopathy, + jugular venous distention. Chest: Lungs are coarse bilaterally with scattered rhonchi and rales. CV: Heart sounds are regular with an S1, S2. No murmurs, rubs, clicks, or gallops.  Abdomen: Soft, nontender, nondistended with normal active bowel sounds. No hepatosplenomegaly or palpable masses. Genitourinary: Large and tender, swollen scrotum with fluctuance.  Condom catheter in place. Extremities: Extremities are without clubbing, or cyanosis.  3+ pitting edema. Pedal pulses 2+.  Skin: Warm and dry. No rashes, lesions or wounds. Neuro: Alert and oriented times 3; grossly nonfocal.  Psych: Insight and judgment are poor.  Mood and affect appear to  be mildly anxious.   Data Review:    Labs: Basic Metabolic Panel: Recent Labs  Lab 09/13/18 0352 09/15/18 0230 09/17/18 0522  NA 137 138 138  K 3.5 3.7 3.3*  CL 108 104 102  CO2 '23 24 28  ' GLUCOSE 256* 275* 339*  BUN 17 21* 15  CREATININE 1.02 1.07 1.08  CALCIUM 8.5* 9.0 8.5*   Liver Function Tests: Recent Labs  Lab 09/13/18 0352 09/17/18 0522  AST 42* 32  ALT 28 20  ALKPHOS 91 114  BILITOT 1.1 0.7  PROT 5.7* 5.8*  ALBUMIN 2.6* 2.5*   CBC: Recent Labs  Lab 09/13/18 0352 09/15/18 0328 09/17/18 0522  WBC 9.1 9.7 9.2  NEUTROABS 7.2 8.1* 7.7  HGB 10.4* 10.2* 10.2*  HCT 34.4* 34.5* 34.9*  MCV 85.1 83.3 84.1  PLT 289 297 286   CBG: Recent Labs  Lab 09/17/18 0530  GLUCAP 319*    Urinalysis    Component Value Date/Time   COLORURINE YELLOW 09/13/2018 2126   APPEARANCEUR CLEAR 09/13/2018 2126   LABSPEC 1.022 09/13/2018 2126   PHURINE 5.0 09/13/2018 2126   GLUCOSEU >=500 (A) 09/13/2018 2126   HGBUR NEGATIVE 09/13/2018 2126   BILIRUBINUR NEGATIVE 09/13/2018 2126   KETONESUR NEGATIVE 09/13/2018 2126   PROTEINUR 100 (A) 09/13/2018 2126   UROBILINOGEN 1.0 03/14/2015 0610   NITRITE NEGATIVE 09/13/2018 2126   LEUKOCYTESUR NEGATIVE 09/13/2018 2126      Radiographic Studies: Dg Chest Portable 1 View  Personally reviewed and shows: Cardiomegaly with diffuse mild interstitial prominence, right pleural effusion.  EKG: Independently reviewed.  Sinus rhythm at 97 bpm with probable left atrial enlargement and abnormal T waves concerning for ischemia.   Assessment/Plan:   Principal Problem:   Acute on chronic combined systolic and diastolic CHF (congestive heart failure) ([place]) complicated by acute hypoxic respiratory failure The patient is admitted to telemetry.  He  is clearly volume overloaded with significant peripheral edema and findings on chest radiography as well as physical exam indicating pulmonary edema.  He has known systolic dysfunction with an EF  30-35% as well as grade 3 diastolic dysfunction, and will be placed on 40 mg IV Lasix every 12 hours.  He is hypoxic with need for supplemental oxygen at this time.  He does not use oxygen at baseline.  We will continue his ACE inhibitor and Coreg for now.  He will need to be monitored closely with daily lab evaluation of electrolytes and adjustment of his diuretics to promote appropriate diuresis.  Inpatient status felt to be reasonable and necessary given the patient's high risk of decompensation related to his acute hypoxic respiratory failure in the setting of severe CHF, presenting signs and symptoms including significant peripheral edema/abnormal lung findings in concert with abnormal chest radiography.  He remains at high risk due to his poor social situation and will likely need greater than 2 midnights worth of care to stabilize.  Active Problems:   Insulin-requiring or dependent type II diabetes mellitus ([place]) He was markedly hyperglycemic on presentation.  His hemoglobin A1c was 7.3% recently.  We will continue his regular dose of Lantus and add moderate scale SSI before meals/at bedtime.    Essential hypertension, benign Blood pressure markedly elevated on admission, consistent with volume overload with possible noncompliance contributory.  We will continue aggressive diuresis, Coreg, lisinopril, and as needed hydralazine.    Cocaine use disorder, severe, dependence ([place]) UDS consistently positive for cocaine.  We will not reorder as he is a known polysubstance abuser.    Chronic paranoid schizophrenia (HCC)/substance-induced mood disorder complicated by homelessness We will continue his psychotropics including Tegretol and Abilify.  He has a history of destructive behavior and frequently splits the staff to get his needs met.  A thorough plan of care has been developed and has been reviewed with him and his on the physical chart.  He signed a copy of this.  All staff are expected to follow  this plan of care.    Hypokalemia Has been placed on 20 mEq of supplementation every 12 hours.    Scrotal swelling Likely from edema, but the patient reports a sore on the posterior scrotum.  Will obtain a scrotal ultrasound to rule out deeper infection/abscess. Addendum: [place] neg for abscess. Monitor for now.  No indication for antibiotics at present.  Body mass index is 30.88 kg/m.  Other information:   DVT prophylaxis: Lovenox ordered. Code Status: Full code. Family Communication: No family present. Disposition Plan: Home when stable. Consults called: None. Admission status: Full code.    The medical decision making on this patient was of high complexity and the patient is at high risk for clinical deterioration, therefore this is a level 3 visit.   Margreta Journey Mccall Will [facility] Pager [phone]   How to contact the [facility] Attending or Consulting provider [place] or covering provider during after hours [place], for this patient?   1. Check the care team in [facility] and look for a) attending/consulting TR[facility] provider listed and b) the [facility] team listed 2. Log into [email or web address] and use [place]'s universal password to access. If you do not have the password, please contact the hospital operator. 3. Locate the [facility] provider you are looking for under [facility] and page to a number that you can be directly reached. 4. If you still have difficulty reaching the provider, please page the [facility] (Director  on Call) for the Hospitalists listed on amion for assistance.  09/17/2018, 11:09 AM

## 2018-09-17 NOTE — Progress Notes (Signed)
Vital signs, CBG, and cardiac monitor replacement performed by this RN. CBG = 38 at 20:26. Last CBG in Epic was 337 at 13:13 this afternoon. 1/2 amp Dextrose 50% given. MD paged.   CBG = 43 upon reassessment. 2 packs of graham crackers with peanut butter and 1 ice cream given. Will continue to monitor.

## 2018-09-17 NOTE — ED Notes (Addendum)
Patient ambulated to scale outside of room on RA pt 02 saturation dropped to 85% with labored breathing.  Patient redirected back to room and given 02 @ 2L.

## 2018-09-17 NOTE — ED Provider Notes (Signed)
TIME SEEN: 5:21 AM  CHIEF COMPLAINT: Chest pain, shortness of breath  HPI: Patient is a 58 year old male with history of homelessness, hepatitis C, polysubstance abuse, schizophrenia, CHF, diabetes, hypertension who presents to the emergency department with EMS for complaints of chest tightness and shortness of breath for the past week with increased swelling in his legs and scrotum.  He is seen here frequently for the same with 37 visits in the past 6 months.  EMS gave nitro and aspirin in route.  They state that his oxygen saturation was 80% on room air.  Doing well on nonrebreather.  Currently sats 96% on room air in the ED.  Blood sugar with EMS 508.  Echo 09/02/18:  IMPRESSIONS    1. The left ventricle has severely reduced systolic function, with an ejection fraction of 25-30%. The cavity size was normal. There is moderately increased left ventricular wall thickness. Left ventricular diastolic Doppler parameters are consistent  with restrictive filling Elevated left atrial and left ventricular end-diastolic pressures The E/e' is >20. Left ventrical global hypokinesis without regional wall motion abnormalities.  2. The right ventricle has moderately reduced systolic function. The cavity was normal. There is moderately increased right ventricular wall thickness.  3. Left atrial size was severely dilated.  4. Right atrial size was severely dilated.  5. Mildly thickened tricuspid valve leaflets.  6. The mitral valve is degenerative. Mild thickening of the mitral valve leaflet. Mitral valve regurgitation is moderate to severe by color flow Doppler.  7. The tricuspid valve is degenerative. Tricuspid valve regurgitation is moderate-severe.  8. The aortic valve is tricuspid Mild sclerosis of the aortic valve. Aortic valve regurgitation is mild by color flow Doppler.  9. The pulmonic valve was grossly normal. Pulmonic valve regurgitation is mild by color flow Doppler. 10. The aortic root and  ascending aorta are normal in size and structure. 11. The inferior vena cava was dilated in size with <50% respiratory variability. 12. When compared to the prior study: 03-06-2018: LVEF 30-35%.  ROS: Level 5 caveat given patient is a very poor historian  PAST MEDICAL HISTORY/PAST SURGICAL HISTORY:  Past Medical History:  Diagnosis Date  . CHF (congestive heart failure) (HCC)   . Chronic foot pain   . Cocaine abuse (HCC)   . Diabetes mellitus without complication (HCC)   . Hepatitis C    unsure   . Homelessness   . Hypertension   . Neuropathy   . Polysubstance abuse (HCC)   . Schizophrenia (HCC)   . Sleep apnea     MEDICATIONS:  Prior to Admission medications   Medication Sig Start Date End Date Taking? Authorizing Provider  ARIPiprazole (ABILIFY) 10 MG tablet Take 1 tablet (10 mg total) by mouth daily. 09/04/18   Rodolph Bong, MD  aspirin EC 81 MG tablet Take 1 tablet (81 mg total) by mouth daily. Patient not taking: Reported on 08/18/2018 08/09/18 08/09/19  Berton Mount I, MD  atorvastatin (LIPITOR) 40 MG tablet Take 1 tablet (40 mg total) by mouth daily. 09/04/18   Rodolph Bong, MD  bisacodyl (DULCOLAX) 5 MG EC tablet Take 1 tablet (5 mg total) by mouth daily as needed for moderate constipation. 09/04/18   Rodolph Bong, MD  carbamazepine (TEGRETOL) 200 MG tablet Take 0.5 tablets (100 mg total) by mouth 2 (two) times daily. 09/04/18   Rodolph Bong, MD  carvedilol (COREG) 6.25 MG tablet Take 1 tablet (6.25 mg total) by mouth 2 (two) times daily with a meal.  09/04/18   Rodolph Bong, MD  furosemide (LASIX) 40 MG tablet Take 1 tablet (40 mg total) by mouth daily. 09/12/18   Cardama, Amadeo Garnet, MD  Insulin Glargine (LANTUS) 100 UNIT/ML Solostar Pen Inject 14 Units into the skin daily. 09/04/18   Rodolph Bong, MD  Insulin Pen Needle 29G X MISC Use with insulin 09/04/18   Rodolph Bong, MD  lisinopril (PRINIVIL,ZESTRIL) 10 MG tablet Take 1 tablet (10 mg  total) by mouth daily. 09/04/18   Rodolph Bong, MD  nicotine (NICODERM CQ - DOSED IN MG/24 HOURS) 21 mg/24hr patch Place 1 patch (21 mg total) onto the skin daily. 09/04/18   Rodolph Bong, MD  polyethylene glycol [facility] Ethelene Hal) packet Take 17 g by mouth 2 (two) times daily. 09/04/18   Rodolph Bong, MD  Potassium Chloride ER 20 MEQ TBCR Take 20 mEq by mouth daily. 09/04/18   Rodolph Bong, MD  senna-docusate (SENOKOT-S) 8.6-50 MG tablet Take 1 tablet by mouth 2 (two) times daily. 09/04/18   Rodolph Bong, MD  tamsulosin [facility]) 0.4 MG CAPS capsule Take 1 capsule (0.4 mg total) by mouth daily after breakfast. 09/04/18   Rodolph Bong, MD    ALLERGIES:  Allergies  Allergen Reactions  . Haldol [Haloperidol] Other (See Comments)    Muscle spasms, loss of voluntary movement. However, pt has taken Thorazine on multiple occasions with no adverse effects.     SOCIAL HISTORY:  Social History   Tobacco Use  . Smoking status: Current Every Day Smoker    Packs/day: 1.00    Years: 20.00    Pack years: 20.00    Types: Cigarettes  . Smokeless tobacco: Current User  Substance Use Topics  . Alcohol use: Yes    Comment: Daily Drinker     FAMILY HISTORY: Family History  Problem Relation Age of Onset  . Hypertension Other   . Diabetes Other     EXAM: BP (!) 160/98 (BP Location: Left Arm)   Pulse (!) 103   Temp 98 F (36.7 C) (Oral)   Resp (!) 22   Ht 5\' 10"  (1.778 m)   Wt 85 kg   SpO2 95%   BMI 26.89 kg/m  CONSTITUTIONAL: Alert and oriented and responds appropriately to questions.  Chronically ill-appearing, afebrile HEAD: Normocephalic EYES: Conjunctivae clear, pupils appear equal, EOMI ENT: normal nose; moist mucous membranes, poor dentition without obvious sign of abscess, no facial swelling, no Ludwick's angina, normal speech, no trismus or drooling NECK: Supple, no meningismus, no nuchal rigidity, no LAD  CARD: Regular and minimally tachycardic; S1 and  S2 appreciated; no murmurs, no clicks, no rubs, no gallops RESP: Normal chest excursion without splinting or tachypnea; breath sounds clear and equal bilaterally; no wheezes, no rhonchi, no rales, no hypoxia or respiratory distress, speaking full sentences ABD/GI: Normal bowel sounds; non-distended; soft, non-tender, no rebound, no guarding, no peritoneal signs, no hepatosplenomegaly BACK:  The back appears normal and is non-tender to palpation, there is no CVA tenderness EXT: Normal ROM in all joints; non-tender to palpation; pitting edema in bilateral lower extremities and into the scrotum; normal capillary refill; no cyanosis, no calf tenderness or swelling    SKIN: Normal color for age and race; warm; no rash NEURO: Moves all extremities equally PSYCH: Bizarre affect.  MEDICAL DECISION MAKING: Patient here with possible CHF exacerbation.  History of the same.  Appears volume overloaded today but no hypoxia currently.  EKG shows no new  ischemic changes.  Will obtain labs, chest x-ray.  Patient also hyperglycemic with EMS.  Will recheck CBG in the ED.  ED PROGRESS: Patient now endorses suicidal thoughts with plan to run into traffic or have the police shoot him.  Will add on psychiatric screening labs and urine.  Once medically cleared, will consult TTS.  Patient's oxygen saturation drops into the mid 80s with ambulating.  He does not normally wear oxygen.  Chest x-ray concerning for worsening pulmonary edema.  Patient does have history of CHF with ejection fraction of 25-30% on last echo.  Will admit for IV diuresis.   6:25 AM Discussed patient's case with hospitalist, Dr. Clyde Lundborg.  I have recommended admission and patient (and family if present) agree with this plan. Admitting physician will place admission orders.   I reviewed all nursing notes, vitals, pertinent previous records, EKGs, lab and urine results, imaging (as available).     EKG Interpretation  Date/Time:  Thursday September 17 2018  05:19:32 EDT Ventricular Rate:  97 PR Interval:    QRS Duration: 90 QT Interval:  363 QTC Calculation: 462 R Axis:   0 Text Interpretation:  Sinus rhythm Probable left atrial enlargement Anterior infarct, old Abnormal T, consider ischemia, lateral leads No significant change since last tracing Confirmed by Lerline Valdivia, [name] [phone]) on 09/17/2018 5:24:30 AM         Jannet Calip, Layla Maw, DO 09/17/18 3329

## 2018-09-17 NOTE — TOC Initial Note (Signed)
Transition of Care [facility]) - Initial/Assessment Note    Patient Details  Name: [name] MRN: [id number] Date of Birth: March 21, 1961  Transition of Care [facility]) CM/SW Contact:    Reola Mosher Phone Number: [phone] 09/17/2018, 10:59 AM  Clinical Narrative:                 Patient known to me from previous admissions; please see note from 09/17/2018 for additional information; has Medicaid with prescription drug coverage. Has list of shelters in the area. Independent of all of his ADL's; up in room without any difficulty. Verbally aggressive;   Expected Discharge Plan: Home/Self Care     Patient Goals and CMS Choice Patient states their goals for this hospitalization and ongoing recovery are:: find a house CMS Medicare.gov Compare Post Acute Care list provided to:: Patient    Expected Discharge Plan and Services Expected Discharge Plan: Home/Self Care Discharge Planning Services: CM Consult                     DME Arranged: N/A DME Agency: NA HH Arranged: NA HH Agency: NA  Prior Living Arrangements/Services     Patient language and need for interpreter reviewed:: Yes Do you feel safe going back to the place where you live?: Yes      Need for Family Participation in Patient Care: No (Comment) Care giver support system in place?: No (comment)   Criminal Activity/Legal Involvement Pertinent to Current Situation/Hospitalization: No - Comment as needed  Activities of Daily Living Home Assistive Devices/Equipment: Cane (specify quad or straight) ADL Screening (condition at time of admission) Patient's cognitive ability adequate to safely complete daily activities?: No Is the patient deaf or have difficulty hearing?: No Does the patient have difficulty seeing, even when wearing glasses/contacts?: No Does the patient have difficulty concentrating, remembering, or making decisions?: No Patient able to express need for assistance with ADLs?: Yes Does  the patient have difficulty dressing or bathing?: No Independently performs ADLs?: Yes (appropriate for developmental age) Does the patient have difficulty walking or climbing stairs?: Yes Weakness of Legs: Both Weakness of Arms/Hands: None  Permission Sought/Granted Permission sought to share information with : Case Manager Permission granted to share information with : Yes, Verbal Permission Granted              Emotional Assessment Appearance:: Appears stated age Attitude/Demeanor/Rapport: Aggressive (Verbally and/or physically), Attention Seeking Affect (typically observed): Flat Orientation: : Oriented to Self, Oriented to  Time, Oriented to Place, Oriented to Situation Alcohol / Substance Use: Illicit Drugs    Admission diagnosis:  Peripheral edema [R60.9] Suicidal thoughts [R45.851] Hypoxia [R09.02] Acute on chronic systolic congestive heart failure (HCC) [I50.23] Chest pain, unspecified type [R07.9] Patient Active Problem List   Diagnosis Date Noted  . Acute on chronic systolic (congestive) heart failure (HCC) 09/17/2018  . Acute on chronic diastolic CHF (congestive heart failure) (HCC) 09/17/2018  . Urinary hesitancy   . Constipation   . Acute on chronic combined systolic and diastolic CHF (congestive heart failure) (HCC) 08/25/2018  . Acute respiratory failure with hypoxia (HCC) 08/25/2018  . Acute on chronic combined systolic and diastolic congestive heart failure (HCC) 08/18/2018  . Tobacco use 08/18/2018  . 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 Homelessness 08/08/2018  . Smoker 08/08/2018  . CHF (congestive heart failure) (HCC) 07/15/2018  . Acute exacerbation of CHF (congestive heart failure) (HCC) 03/28/2018  . Prostate enlargement 03/16/2018  .  Aortic atherosclerosis (HCC) 03/16/2018  . 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 Chronic foot pain   . Schizoaffective disorder,  bipolar type (HCC) 09/30/2016  . Substance induced mood disorder (HCC) 03/13/2015  . Acute kidney failure (HCC) 01/26/2015  . Schizophrenia, paranoid type (HCC) 01/17/2015  . Suicidal ideation   . Drug hallucinosis (HCC) 10/08/2014  . Chronic paranoid schizophrenia (HCC) 09/07/2014  . 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 Urinary retention   . Cocaine use disorder, severe, dependence (HCC)   . Essential hypertension, benign 03/28/2013  . Insulin-requiring or dependent type II diabetes mellitus (HCC) 03/15/2013   PCP:  Patient, No Pcp Per Pharmacy:   [id number] [place], [place] - [address] AT [facility] [address] [place] [address] Phone: [phone] Fax: [phone]  [id number] - [place], [place] - [address] [place] [address] Phone: [phone] Fax: [phone]  [facility] [place], [place] - [address] [place] [address] Phone: [phone] Fax: [phone]     Social Determinants of Health (SDOH) Interventions    Readmission Risk Interventions 30 Day Unplanned Readmission Risk Score     ED to Hosp-Admission (Current) from 09/17/2018 in [facility] 3E CHF  30 Day Unplanned Readmission Risk Score (%)  93 Filed at 09/17/2018 0801     This score is the patient's risk of an unplanned readmission within 30 days of being discharged (0 -100%). The score is based on dignosis, age, lab data, medications, orders, and past utilization.   Low:  0-14.9   Medium: 15-21.9   High: 22-29.9   Extreme: 30 and above       No flowsheet data found.

## 2018-09-17 NOTE — Care Management (Signed)
This is a no charge note  Pending admission per Dr. Elesa Massed  58 year old male with past medical history of [name] with EF of 25-30%, hypertension, diabetes mellitus, polysubstance abuse including cocaine, OSA, schizophrenia, homeless, who presents with shortness of breath and CP after using cocaine.  Chest x-ray showed interstitial pulmonary edema.  Pending BNP.  Patient has oxygen desaturation to 80s on ambulation.  Clinically consistent with CHF exacerbation.  Patient also has suicidal ideation.  Patient is admitted to telemetry bed as inpatient.  40 mg of IV Lasix was given by EDP.  Lorretta Harp, MD  [facility]   If 7PM-7AM, please contact night-coverage [email or web address] Password [facility] 09/17/2018, 6:27 AM

## 2018-09-18 LAB — BASIC METABOLIC PANEL
Anion gap: 12 (ref 5–15)
BUN: 13 mg/dL (ref 6–20)
CALCIUM: 9.2 mg/dL (ref 8.9–10.3)
CO2: 21 mmol/L — ABNORMAL LOW (ref 22–32)
Chloride: 100 mmol/L (ref 98–111)
Creatinine, Ser: 0.88 mg/dL (ref 0.61–1.24)
GFR calc Af Amer: >60 mL/min (ref >60)
GFR calc non Af Amer: >60 mL/min (ref >60)
Glucose, Bld: 118 mg/dL — ABNORMAL HIGH (ref 70–99)
Potassium: 3.6 mmol/L (ref 3.5–5.1)
Sodium: 133 mmol/L — ABNORMAL LOW (ref 135–145)

## 2018-09-18 LAB — GLUCOSE, CAPILLARY
Glucose-Capillary: 136 mg/dL — ABNORMAL HIGH (ref 70–99)
Glucose-Capillary: 180 mg/dL — ABNORMAL HIGH (ref 70–99)

## 2018-09-18 MED ORDER — CARBAMAZEPINE 200 MG PO TABS: 100.0000 mg | ORAL_TABLET | Freq: Two times a day (BID) | ORAL | 2 refills | Status: DC

## 2018-09-18 MED ORDER — INSULIN GLARGINE 100 UNIT/ML SOLOSTAR PEN: 14.0000 [IU] | PEN_INJECTOR | Freq: Every day | SUBCUTANEOUS | 2 refills | Status: DC

## 2018-09-18 MED ORDER — POLYETHYLENE GLYCOL 3350 17 G PO PACK: 17.0000 g | PACK | Freq: Two times a day (BID) | ORAL | 0 refills | Status: DC

## 2018-09-18 MED ORDER — TAMSULOSIN HCL 0.4 MG PO CAPS: 0.4000 mg | ORAL_CAPSULE | Freq: Every day | ORAL | 1 refills | Status: DC

## 2018-09-18 MED ORDER — POTASSIUM CHLORIDE ER 20 MEQ PO TBCR: 20.0000 meq | EXTENDED_RELEASE_TABLET | Freq: Every day | ORAL | 1 refills | Status: DC

## 2018-09-18 MED ORDER — ATORVASTATIN CALCIUM 40 MG PO TABS: 40.0000 mg | ORAL_TABLET | Freq: Every day | ORAL | 1 refills | Status: DC

## 2018-09-18 MED ORDER — INSULIN PEN NEEDLE 29G X 8MM MISC: 0 refills | Status: DC

## 2018-09-18 MED ORDER — LORAZEPAM 2 MG/ML IJ SOLN: 2.0000 mg | Freq: Once | INTRAMUSCULAR | Status: DC

## 2018-09-18 MED ORDER — ASPIRIN EC 81 MG PO TBEC: 81.0000 mg | DELAYED_RELEASE_TABLET | Freq: Every day | ORAL | 2 refills | Status: DC

## 2018-09-18 MED ORDER — FUROSEMIDE 40 MG PO TABS: 40.0000 mg | ORAL_TABLET | Freq: Every day | ORAL | 3 refills | Status: DC

## 2018-09-18 MED ORDER — CARVEDILOL 6.25 MG PO TABS: 6.2500 mg | ORAL_TABLET | Freq: Two times a day (BID) | ORAL | 1 refills | Status: DC

## 2018-09-18 MED ORDER — INSULIN ASPART 100 UNIT/ML ~~LOC~~ SOLN: 0.0000 [IU] | Freq: Three times a day (TID) | SUBCUTANEOUS | Status: DC

## 2018-09-18 MED ORDER — LISINOPRIL 10 MG PO TABS: 10.0000 mg | ORAL_TABLET | Freq: Every day | ORAL | 1 refills | Status: DC

## 2018-09-18 MED ORDER — ARIPIPRAZOLE 10 MG PO TABS: 10.0000 mg | ORAL_TABLET | Freq: Every day | ORAL | 1 refills | Status: DC
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
## 2018-09-18 NOTE — Progress Notes (Signed)
Inpatient Diabetes Program Recommendations  AACE/ADA: New Consensus Statement on Inpatient Glycemic Control (2015)  Target Ranges:  Prepandial:   less than 140 mg/dL      Peak postprandial:   less than 180 mg/dL (1-2 hours)      Critically ill patients:  140 - 180 mg/dL   Lab Results  Component Value Date   GLUCAP 180 (H) 09/18/2018   HGBA1C 7.3 (H) 07/18/2018    Results for FINNEAS, STENGEL (MRN [id number]) as of 09/18/2018 10:16  Ref. Range 09/17/2018 05:30 09/17/2018 13:13 09/17/2018 18:19 09/17/2018 20:26 09/17/2018 21:15 09/17/2018 21:54 09/18/2018 00:28 09/18/2018 06:18  Glucose-Capillary Latest Ref Range: 70 - 99 mg/dL 100 (H) 712 (H)  Novolog  11 units     Novolog 11 units  Lantus 14 units 38 (LL) 43 (LL) 89 136 (H) 180 (H)  Novolog 3 units  Lantus 14 units        Review of Glycemic Control  Diabetes history:DM2 Outpatient Diabetes medications: Lantus 14 units daily Current orders for Inpatient glycemic control: Lantus 14 units daily                                                                          Novolog moderate (0-15 units) scale tid and (0-5 units) hs     Noted CBG down to 38 at 2026. Most likely due to the two doses of Novolog (moderate correction) of 11 units at 1313 (lunch)  and 1819 (dinner). Recommend to decrease to Novolog sensitive scale.  Lantus is daily and was given when ordered/available yesterday for the daily dose at 1819. Today's dose was scheduled for am and given at 0844. Would not recommend making any Lantus changes today as they were given less than 24 hours apart. Will be able to assess Lantus effectiveness better tomorrow after 24 hours after given.      MD please consider the following inpatient insulin adjustment:   Decrease Novolog to sensitive scale (0-9 units) tid ac    Thank you.        -- Will follow during hospitalization.--  Jamelle Rushing RN, MSN Diabetes Coordinator Inpatient Glycemic Control  Team Team Pager: [phone] (8am-5pm)

## 2018-09-18 NOTE — Progress Notes (Signed)
Pt has orders to be discharged. Discharge instructions given and pt has no additional questions at this time. Prescription given. Medication regimen reviewed and pt educated. Pt verbalized understanding and has no additional questions. Telemetry box removed. IV removed and site in good condition. Scrotal sling given. Pt stable and walked off of the unit on his own as he declined leaving in a wheelchair.

## 2018-09-18 NOTE — Progress Notes (Signed)
Pt continues to ask various staff members/ staff other than primary RN and NT passing by in the hallway for snacks and drinks.

## 2018-09-18 NOTE — Progress Notes (Signed)
Patient drinking water from the faucet in his room.  Patient non compliant with fluid restriction.  Elnita Maxwell, RN
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
## 2018-09-20 ENCOUNTER — Encounter (HOSPITAL_COMMUNITY): Payer: Self-pay | Admitting: Oncology

## 2018-09-20 ENCOUNTER — Emergency Department (HOSPITAL_COMMUNITY): Payer: Medicaid Other

## 2018-09-20 ENCOUNTER — Other Ambulatory Visit: Payer: Self-pay

## 2018-09-20 ENCOUNTER — Emergency Department (HOSPITAL_COMMUNITY)
Admission: EM | Admit: 2018-09-20 | Discharge: 2018-09-20 | Disposition: A | Discharge status: Home / Self Care | Payer: Medicaid Other | Attending: Emergency Medicine | Admitting: Emergency Medicine

## 2018-09-20 DIAGNOSIS — I110 Hypertensive heart disease with heart failure: Secondary | ICD-10-CM | POA: Insufficient documentation

## 2018-09-20 DIAGNOSIS — R2243 Localized swelling, mass and lump, lower limb, bilateral: Secondary | ICD-10-CM | POA: Insufficient documentation

## 2018-09-20 DIAGNOSIS — Z7982 Long term (current) use of aspirin: Secondary | ICD-10-CM | POA: Diagnosis not present

## 2018-09-20 DIAGNOSIS — Z794 Long term (current) use of insulin: Secondary | ICD-10-CM | POA: Insufficient documentation

## 2018-09-20 DIAGNOSIS — Z79899 Other long term (current) drug therapy: Secondary | ICD-10-CM | POA: Diagnosis not present

## 2018-09-20 DIAGNOSIS — I5042 Chronic combined systolic (congestive) and diastolic (congestive) heart failure: Secondary | ICD-10-CM | POA: Insufficient documentation

## 2018-09-20 DIAGNOSIS — E1140 Type 2 diabetes mellitus with diabetic neuropathy, unspecified: Secondary | ICD-10-CM | POA: Insufficient documentation

## 2018-09-20 DIAGNOSIS — R1909 Other intra-abdominal and pelvic swelling, mass and lump: Secondary | ICD-10-CM

## 2018-09-20 DIAGNOSIS — F17210 Nicotine dependence, cigarettes, uncomplicated: Secondary | ICD-10-CM | POA: Insufficient documentation

## 2018-09-20 LAB — CBC WITH DIFFERENTIAL/PLATELET
Abs Immature Granulocytes: 0.02 10*3/uL (ref 0.00–0.07)
Basophils Absolute: 0.1 10*3/uL (ref 0.0–0.1)
Basophils Relative: 1 %
EOS ABS: 0 10*3/uL (ref 0.0–0.5)
Eosinophils Relative: 0 %
HCT: 36.1 % — ABNORMAL LOW (ref 39.0–52.0)
Hemoglobin: 10.5 g/dL — ABNORMAL LOW (ref 13.0–17.0)
Immature Granulocytes: 0 %
Lymphocytes Relative: 13 %
Lymphs Abs: 0.9 10*3/uL (ref 0.7–4.0)
MCH: 24.6 pg — ABNORMAL LOW (ref 26.0–34.0)
MCHC: 29.1 g/dL — ABNORMAL LOW (ref 30.0–36.0)
MCV: 84.7 fL (ref 80.0–100.0)
Monocytes Absolute: 0.7 10*3/uL (ref 0.1–1.0)
Monocytes Relative: 12 %
Neutro Abs: 4.7 10*3/uL (ref 1.7–7.7)
Neutrophils Relative %: 74 %
PLATELETS: 275 10*3/uL (ref 150–400)
RBC: 4.26 MIL/uL (ref 4.22–5.81)
RDW: 17.1 % — AB (ref 11.5–15.5)
WBC: 6.4 10*3/uL (ref 4.0–10.5)
nRBC: 0 % (ref 0.0–0.2)

## 2018-09-20 LAB — RAPID URINE DRUG SCREEN, HOSP PERFORMED
Amphetamines: NOT DETECTED
BARBITURATES: NOT DETECTED
BENZODIAZEPINES: NOT DETECTED
Cocaine: POSITIVE — AB
Opiates: NOT DETECTED
Tetrahydrocannabinol: NOT DETECTED

## 2018-09-20 LAB — BRAIN NATRIURETIC PEPTIDE: B Natriuretic Peptide: 1065 pg/mL — ABNORMAL HIGH (ref 0.0–100.0)

## 2018-09-20 LAB — URINALYSIS, ROUTINE W REFLEX MICROSCOPIC
Bacteria, UA: NONE SEEN
Bilirubin Urine: NEGATIVE
Glucose, UA: 150 mg/dL — AB
Ketones, ur: NEGATIVE mg/dL
Leukocytes,Ua: NEGATIVE
Nitrite: NEGATIVE
PH: 5 (ref 5.0–8.0)
Protein, ur: 30 mg/dL — AB
Specific Gravity, Urine: 1.013 (ref 1.005–1.030)

## 2018-09-20 LAB — BASIC METABOLIC PANEL
Anion gap: 11 (ref 5–15)
BUN: 23 mg/dL — ABNORMAL HIGH (ref 6–20)
CO2: 25 mmol/L (ref 22–32)
Calcium: 8.3 mg/dL — ABNORMAL LOW (ref 8.9–10.3)
Chloride: 102 mmol/L (ref 98–111)
Creatinine, Ser: 1.25 mg/dL — ABNORMAL HIGH (ref 0.61–1.24)
Glucose, Bld: 305 mg/dL — ABNORMAL HIGH (ref 70–99)
Potassium: 3 mmol/L — ABNORMAL LOW (ref 3.5–5.1)
Sodium: 138 mmol/L (ref 135–145)

## 2018-09-20 LAB — CBG MONITORING, ED: Glucose-Capillary: 341 mg/dL — ABNORMAL HIGH (ref 70–99)

## 2018-09-20 MED ORDER — FUROSEMIDE 10 MG/ML IJ SOLN
40.0000 mg | Freq: Once | INTRAMUSCULAR | Status: AC
  Administered 2018-09-20: 40 mg via INTRAVENOUS
  Filled 2018-09-20: qty 4

## 2018-09-20 MED ORDER — ACETAMINOPHEN 500 MG PO TABS
500.0000 mg | ORAL_TABLET | Freq: Once | ORAL | Status: AC
  Administered 2018-09-20: 500 mg via ORAL
  Filled 2018-09-20: qty 1

## 2018-09-20 NOTE — ED Notes (Signed)
Patient verbalizes understanding of discharge instructions. Opportunity for questioning and answers were provided. Armband removed by staff, pt discharged from ED.  

## 2018-09-20 NOTE — Progress Notes (Signed)
CSW received social work consult and met with patient in patient room. CSW discussed with patient his history of homelessness and substance use. CSW noted patient continued to re-direct conversation about shoes. CSW noted patient was concerned about not having clean clothes when he left. CSW provided patient with a thick shirt, socks, and wool hat. CSW noted patient responded positively. CSW provided patient with resources for homelessness and substance use. CSW attempted to review with patient but patient refused to discuss resources. CSW signing off at this time. Please re-consult for further social work needs.  Lamonte Richer, LCSW, [place] Worker II [phone]

## 2018-09-20 NOTE — Discharge Instructions (Addendum)
Please be sure to follow-up with your physician.  Call tomorrow for an appointment.  Return here for concerning changes in your condition.

## 2018-09-20 NOTE — ED Provider Notes (Signed)
Patient in no distress, sitting upright, stating that he is ready to go.   Gerhard Munch, MD 09/20/18 [phone]

## 2018-09-20 NOTE — ED Provider Notes (Signed)
[facility] EMERGENCY DEPARTMENT Provider Note   CSN: [id number] Arrival date & time: 09/20/18  0540    History   Chief Complaint Chief Complaint  Patient presents with  . Groin Swelling    HPI [name] is a 58 y.o. male.     Patient is a 58 year old male with past medical history of schizophrenia, polysubstance abuse, CHF, diabetes, and hypertension.  He is well-known to the emergency department for multiple visits involving these issues.  He is also homeless and suspected of using the emergency department for food and shelter.  He presents today with complaints of swelling in his legs and scrotum.  He was recently admitted with similar complaints and was discharged 2 days ago.  He returns today with worsening pain and swelling in these areas.  He reports shortness of breath.  He does admit to crack cocaine use since he was discharged.  He denies chest pain.  The history is provided by the patient.    Past Medical History:  Diagnosis Date  . Chronic foot pain   . Cocaine abuse (HCC)   . Diabetes mellitus without complication (HCC)   . Hepatitis C    unsure   . Homelessness   . Hypertension   . Neuropathy   . Polysubstance abuse (HCC)   . Schizophrenia (HCC)   . Sleep apnea   . Systolic and diastolic CHF, chronic [facility])     Patient Active Problem List   Diagnosis Date Noted  . Hypokalemia 09/17/2018  . Scrotal swelling 09/17/2018  . Urinary hesitancy   . Constipation   . Acute on chronic combined systolic and diastolic CHF (congestive heart failure) (HCC) 08/25/2018  . Acute respiratory failure with hypoxia (HCC) 08/25/2018  . Acute on chronic combined systolic and diastolic congestive heart failure (HCC) 08/18/2018  . Tobacco use 08/18/2018  . 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 Homelessness 08/08/2018  . Smoker 08/08/2018  . Acute exacerbation of CHF  (congestive heart failure) (HCC) 03/28/2018  . Prostate enlargement 03/16/2018  . Aortic atherosclerosis (HCC) 03/16/2018  . 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 Chronic foot pain   . Schizoaffective disorder, bipolar type (HCC) 09/30/2016  . Substance induced mood disorder (HCC) 03/13/2015  . Acute kidney failure (HCC) 01/26/2015  . Schizophrenia, paranoid type (HCC) 01/17/2015  . Suicidal ideation   . Drug hallucinosis (HCC) 10/08/2014  . Chronic paranoid schizophrenia (HCC) 09/07/2014  . 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 Urinary retention   . Cocaine use disorder, severe, dependence (HCC)   . Essential hypertension, benign 03/28/2013  . 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 MULTIPLE TOOTH EXTRACTIONS          Home Medications    Prior to Admission medications   Medication Sig Start Date End Date Taking? Authorizing Provider  ARIPiprazole (ABILIFY) 10 MG tablet Take 1 tablet (10 mg total) by mouth daily. 09/18/18   Meredeth Ide, MD  aspirin EC 81 MG tablet Take 1 tablet (81 mg total) by mouth daily for 30 days. 09/18/18 10/18/18  Meredeth Ide, MD  atorvastatin (LIPITOR) 40 MG tablet Take 1 tablet (40 mg total) by mouth daily. 09/18/18   Meredeth Ide, MD  bisacodyl (DULCOLAX) 5 MG EC tablet Take 1 tablet (5 mg total) by mouth  daily as needed for moderate constipation. 09/04/18   Rodolph Bong, MD  carbamazepine (TEGRETOL) 200 MG tablet Take 0.5 tablets (100 mg total) by mouth 2 (two) times daily. 09/18/18   Meredeth Ide, MD  carvedilol (COREG) 6.25 MG tablet Take 1 tablet (6.25 mg total) by mouth 2 (two) times daily with a meal. 09/18/18   Meredeth Ide, MD  furosemide (LASIX) 40 MG tablet Take 1 tablet (40 mg total) by mouth daily. 09/18/18   Meredeth Ide, MD  Insulin Glargine (LANTUS) 100 UNIT/ML  Solostar Pen Inject 14 Units into the skin daily. 09/18/18   Meredeth Ide, MD  Insulin Pen Needle 29G X MISC Use with insulin 09/18/18   Meredeth Ide, MD  lisinopril (PRINIVIL,ZESTRIL) 10 MG tablet Take 1 tablet (10 mg total) by mouth daily. 09/18/18   Meredeth Ide, MD  nicotine (NICODERM CQ - DOSED IN MG/24 HOURS) 21 mg/24hr patch Place 1 patch (21 mg total) onto the skin daily. 09/04/18   Rodolph Bong, MD  polyethylene glycol [facility] Ethelene Hal) packet Take 17 g by mouth 2 (two) times daily. 09/18/18   Meredeth Ide, MD  Potassium Chloride ER 20 MEQ TBCR Take 20 mEq by mouth daily. 09/18/18   Meredeth Ide, MD  senna-docusate (SENOKOT-S) 8.6-50 MG tablet Take 1 tablet by mouth 2 (two) times daily. 09/04/18   Rodolph Bong, MD  tamsulosin (FLOMAX) 0.4 MG CAPS capsule Take 1 capsule (0.4 mg total) by mouth daily after breakfast. 09/18/18   Meredeth Ide, MD    Family History Family History  Problem Relation Age of Onset  . Hypertension Other   . Diabetes Other     Social History Social History   Tobacco Use  . Smoking status: Current Every Day Smoker    Packs/day: 1.00    Years: 20.00    Pack years: 20.00    Types: Cigarettes  . Smokeless tobacco: Current User  Substance Use Topics  . Alcohol use: Yes    Comment: Daily Drinker   . Drug use: Yes    Frequency: 7.0 times per week    Types: "Crack" cocaine, Cocaine, Marijuana     Allergies   Haldol [haloperidol]   Review of Systems Review of Systems  All other systems reviewed and are negative.    Physical Exam Updated Vital Signs BP (!) 173/98   Pulse 99   Temp 98 F (36.7 C) (Oral)   Resp 20   Ht 5\' 9"  (1.753 m)   Wt 92 kg   SpO2 94%   BMI 29.95 kg/m   Physical Exam Vitals signs and nursing note reviewed.  Constitutional:      General: He is not in acute distress.    Appearance: He is well-developed. He is not diaphoretic.  HENT:     Head: Normocephalic and atraumatic.  Neck:      Musculoskeletal: Normal range of motion and neck supple.  Cardiovascular:     Rate and Rhythm: Normal rate and regular rhythm.     Heart sounds: No murmur. No friction rub.  Pulmonary:     Effort: Pulmonary effort is normal. No respiratory distress.     Breath sounds: Normal breath sounds. No wheezing or rales.  Abdominal:     General: Bowel sounds are normal. There is no distension.     Palpations: Abdomen is soft.     Tenderness: There is no abdominal tenderness.  Genitourinary:    Comments:  There is significant scrotal edema present. Musculoskeletal: Normal range of motion.     Right lower leg: Edema present.     Left lower leg: Edema present.     Comments: There is 3+ pitting edema of both lower extremities.  Skin:    General: Skin is warm and dry.  Neurological:     Mental Status: He is alert and oriented to person, place, and time.     Coordination: Coordination normal.      ED Treatments / Results  Labs (all labs ordered are listed, but only abnormal results are displayed) Labs Reviewed  CBG MONITORING, ED - Abnormal; Notable for the following components:      Result Value   Glucose-Capillary 341 (*)    All other components within normal limits  BASIC METABOLIC PANEL  CBC WITH DIFFERENTIAL/PLATELET  BRAIN NATRIURETIC PEPTIDE    EKG EKG Interpretation  Date/Time:  Sunday September 20 2018 06:28:52 EDT Ventricular Rate:  96 PR Interval:    QRS Duration: 98 QT Interval:  384 QTC Calculation: 486 R Axis:   8 Text Interpretation:  Sinus rhythm Probable left atrial enlargement Anterior infarct, old Abnormal T, consider ischemia, lateral leads Confirmed by Geoffery Lyons ([id number]) on 09/20/2018 6:35:28 AM   Radiology No results found.  Procedures Procedures (including critical care time)  Medications Ordered in ED Medications  furosemide (LASIX) injection 40 mg (40 mg Intravenous Given 09/20/18 6045)     Initial Impression / Assessment and Plan / ED Course  I  have reviewed the triage vital signs and the nursing notes.  Pertinent labs & imaging results that were available during my care of the patient were reviewed by me and considered in my medical decision making (see chart for details).  Patient with history of congestive heart failure, schizophrenia, and homelessness presenting with complaints of swelling of his legs and scrotum.  He is also homeless and requesting assistance.  His work-up reveals elevated BNP and findings of CHF on his chest x-ray.  Patient was given intravenous Lasix with a good diuresis.  Patient is not hypoxic and not in any respiratory distress.  He will be evaluated by social services and discharge is anticipated.  Care will be handed off to Dr. Jeraldine Loots at shift change who will determine the final disposition.  Final Clinical Impressions(s) / ED Diagnoses   Final diagnoses:  None    ED Discharge Orders    None       Geoffery Lyons, MD 09/20/18 2320

## 2018-09-20 NOTE — ED Triage Notes (Signed)
Patient c/o scrotal swelling. States was railroaded to be d/c'd because he had asked for ice cream prior to d/c. Admits to smoking crack-cocaine.

## 2018-09-21 ENCOUNTER — Encounter (HOSPITAL_COMMUNITY): Payer: Self-pay

## 2018-09-21 ENCOUNTER — Emergency Department (HOSPITAL_COMMUNITY)
Admission: EM | Admit: 2018-09-21 | Discharge: 2018-09-21 | Disposition: A | Discharge status: Home / Self Care | Payer: Medicaid Other | Attending: Emergency Medicine | Admitting: Emergency Medicine

## 2018-09-21 ENCOUNTER — Emergency Department (HOSPITAL_COMMUNITY): Payer: Medicaid Other

## 2018-09-21 ENCOUNTER — Other Ambulatory Visit: Payer: Self-pay

## 2018-09-21 ENCOUNTER — Emergency Department (HOSPITAL_COMMUNITY)
Admission: EM | Admit: 2018-09-21 | Discharge: 2018-09-22 | Disposition: A | Discharge status: Home / Self Care | Payer: Medicaid Other | Attending: Emergency Medicine | Admitting: Emergency Medicine

## 2018-09-21 DIAGNOSIS — F1120 Opioid dependence, uncomplicated: Secondary | ICD-10-CM | POA: Diagnosis not present

## 2018-09-21 DIAGNOSIS — F251 Schizoaffective disorder, depressive type: Secondary | ICD-10-CM | POA: Insufficient documentation

## 2018-09-21 DIAGNOSIS — F1420 Cocaine dependence, uncomplicated: Secondary | ICD-10-CM | POA: Insufficient documentation

## 2018-09-21 DIAGNOSIS — F17210 Nicotine dependence, cigarettes, uncomplicated: Secondary | ICD-10-CM | POA: Insufficient documentation

## 2018-09-21 DIAGNOSIS — F250 Schizoaffective disorder, bipolar type: Secondary | ICD-10-CM | POA: Diagnosis not present

## 2018-09-21 DIAGNOSIS — Z5900 Homelessness unspecified: Secondary | ICD-10-CM

## 2018-09-21 DIAGNOSIS — I110 Hypertensive heart disease with heart failure: Secondary | ICD-10-CM | POA: Insufficient documentation

## 2018-09-21 DIAGNOSIS — Z794 Long term (current) use of insulin: Secondary | ICD-10-CM | POA: Insufficient documentation

## 2018-09-21 DIAGNOSIS — R2243 Localized swelling, mass and lump, lower limb, bilateral: Secondary | ICD-10-CM | POA: Diagnosis present

## 2018-09-21 DIAGNOSIS — E1140 Type 2 diabetes mellitus with diabetic neuropathy, unspecified: Secondary | ICD-10-CM | POA: Insufficient documentation

## 2018-09-21 DIAGNOSIS — R609 Edema, unspecified: Secondary | ICD-10-CM

## 2018-09-21 DIAGNOSIS — E119 Type 2 diabetes mellitus without complications: Secondary | ICD-10-CM | POA: Diagnosis not present

## 2018-09-21 DIAGNOSIS — Z79899 Other long term (current) drug therapy: Secondary | ICD-10-CM | POA: Diagnosis not present

## 2018-09-21 DIAGNOSIS — Z7982 Long term (current) use of aspirin: Secondary | ICD-10-CM | POA: Insufficient documentation

## 2018-09-21 DIAGNOSIS — I509 Heart failure, unspecified: Secondary | ICD-10-CM

## 2018-09-21 DIAGNOSIS — I5042 Chronic combined systolic (congestive) and diastolic (congestive) heart failure: Secondary | ICD-10-CM | POA: Diagnosis not present

## 2018-09-21 DIAGNOSIS — I10 Essential (primary) hypertension: Secondary | ICD-10-CM | POA: Diagnosis present

## 2018-09-21 DIAGNOSIS — R45851 Suicidal ideations: Secondary | ICD-10-CM | POA: Insufficient documentation

## 2018-09-21 LAB — CBC WITH DIFFERENTIAL/PLATELET
Abs Immature Granulocytes: 0.03 10*3/uL (ref 0.00–0.07)
Basophils Absolute: 0 10*3/uL (ref 0.0–0.1)
Basophils Relative: 0 %
Eosinophils Absolute: 0 10*3/uL (ref 0.0–0.5)
Eosinophils Relative: 1 %
HEMATOCRIT: 37.8 % — AB (ref 39.0–52.0)
Hemoglobin: 10.9 g/dL — ABNORMAL LOW (ref 13.0–17.0)
Immature Granulocytes: 0 %
LYMPHS ABS: 1 10*3/uL (ref 0.7–4.0)
Lymphocytes Relative: 11 %
MCH: 24.1 pg — ABNORMAL LOW (ref 26.0–34.0)
MCHC: 28.8 g/dL — ABNORMAL LOW (ref 30.0–36.0)
MCV: 83.6 fL (ref 80.0–100.0)
Monocytes Absolute: 0.4 10*3/uL (ref 0.1–1.0)
Monocytes Relative: 4 %
Neutro Abs: 7.2 10*3/uL (ref 1.7–7.7)
Neutrophils Relative %: 84 %
Platelets: 264 10*3/uL (ref 150–400)
RBC: 4.52 MIL/uL (ref 4.22–5.81)
RDW: 17 % — ABNORMAL HIGH (ref 11.5–15.5)
WBC: 8.6 10*3/uL (ref 4.0–10.5)
nRBC: 0 % (ref 0.0–0.2)

## 2018-09-21 LAB — BASIC METABOLIC PANEL
Anion gap: 12 (ref 5–15)
BUN: 25 mg/dL — ABNORMAL HIGH (ref 6–20)
CO2: 25 mmol/L (ref 22–32)
CREATININE: 1.1 mg/dL (ref 0.61–1.24)
Calcium: 8.9 mg/dL (ref 8.9–10.3)
Chloride: 100 mmol/L (ref 98–111)
GFR calc Af Amer: >60 mL/min (ref >60)
GFR calc non Af Amer: >60 mL/min (ref >60)
Glucose, Bld: 241 mg/dL — ABNORMAL HIGH (ref 70–99)
Potassium: 3.7 mmol/L (ref 3.5–5.1)
Sodium: 137 mmol/L (ref 135–145)

## 2018-09-21 LAB — URINALYSIS, ROUTINE W REFLEX MICROSCOPIC
Bilirubin Urine: NEGATIVE
Glucose, UA: NEGATIVE mg/dL
Hgb urine dipstick: NEGATIVE
Ketones, ur: NEGATIVE mg/dL
Leukocytes,Ua: NEGATIVE
Nitrite: NEGATIVE
PH: 7 (ref 5.0–8.0)
Protein, ur: NEGATIVE mg/dL
SPECIFIC GRAVITY, URINE: 1.006 (ref 1.005–1.030)

## 2018-09-21 LAB — RAPID URINE DRUG SCREEN, HOSP PERFORMED
Amphetamines: NOT DETECTED
Barbiturates: NOT DETECTED
Benzodiazepines: NOT DETECTED
COCAINE: POSITIVE — AB
Opiates: NOT DETECTED
TETRAHYDROCANNABINOL: NOT DETECTED

## 2018-09-21 LAB — TROPONIN I: Troponin I: <0.03 ng/mL (ref <0.03)

## 2018-09-21 LAB — ETHANOL: Alcohol, Ethyl (B): <10 mg/dL (ref <10)

## 2018-09-21 LAB — CBG MONITORING, ED: Glucose-Capillary: 161 mg/dL — ABNORMAL HIGH (ref 70–99)

## 2018-09-21 LAB — BRAIN NATRIURETIC PEPTIDE: B Natriuretic Peptide: 1071 pg/mL — ABNORMAL HIGH (ref 0.0–100.0)

## 2018-09-21 LAB — ACETAMINOPHEN LEVEL: Acetaminophen (Tylenol), Serum: <10 ug/mL — ABNORMAL LOW (ref 10–30)

## 2018-09-21 LAB — SALICYLATE LEVEL: Salicylate Lvl: <7 mg/dL (ref 2.8–30.0)

## 2018-09-21 MED ORDER — CARVEDILOL 6.25 MG PO TABS
6.2500 mg | ORAL_TABLET | Freq: Two times a day (BID) | ORAL | Status: DC
  Administered 2018-09-21: 6.25 mg via ORAL
  Filled 2018-09-21: qty 1

## 2018-09-21 MED ORDER — FUROSEMIDE 10 MG/ML IJ SOLN
80.0000 mg | Freq: Once | INTRAMUSCULAR | Status: AC
  Administered 2018-09-21: 80 mg via INTRAVENOUS
  Filled 2018-09-21: qty 8

## 2018-09-21 MED ORDER — LISINOPRIL 10 MG PO TABS
10.0000 mg | ORAL_TABLET | Freq: Every day | ORAL | Status: DC
  Administered 2018-09-21: 10 mg via ORAL
  Filled 2018-09-21: qty 1

## 2018-09-21 MED ORDER — TAMSULOSIN HCL 0.4 MG PO CAPS
0.4000 mg | ORAL_CAPSULE | Freq: Every day | ORAL | Status: DC
  Administered 2018-09-21: 0.4 mg via ORAL
  Filled 2018-09-21: qty 1

## 2018-09-21 MED ORDER — ACETAMINOPHEN 325 MG PO TABS
650.0000 mg | ORAL_TABLET | Freq: Once | ORAL | Status: AC
  Administered 2018-09-21: 650 mg via ORAL
  Filled 2018-09-21: qty 2

## 2018-09-21 MED ORDER — ACETAMINOPHEN 325 MG PO TABS
650.0000 mg | ORAL_TABLET | ORAL | Status: DC | PRN
  Administered 2018-09-21 – 2018-09-22 (×2): 650 mg via ORAL
  Filled 2018-09-21 (×2): qty 2

## 2018-09-21 MED ORDER — FUROSEMIDE 40 MG PO TABS
40.0000 mg | ORAL_TABLET | Freq: Every day | ORAL | Status: DC
  Administered 2018-09-21: 40 mg via ORAL
  Filled 2018-09-21: qty 1

## 2018-09-21 NOTE — ED Provider Notes (Signed)
[facility]-EMERGENCY DEPT Provider Note   CSN: [id number] Arrival date & time: 09/21/18  0320    History   Chief Complaint Chief Complaint  Patient presents with  . Hypertension    HPI [name] is a 58 y.o. male.     HPI 58 year old male with history of CHF, hypertension, diabetes and hepatitis comes in with chief complaint of elevated blood pressure and swelling in his groin.  Patient has known history of CHF with resultant pitting edema in his lower extremity and also his scrotum.  Patient was just discharged from the hospital on 320.  Patient informs me that he has not taken his medications as prescribed.  He also reports that he is homeless and it was cold outside.  He is requesting some food.  Patient was seen in the ER yesterday.  Past Medical History:  Diagnosis Date  . Chronic foot pain   . Cocaine abuse (HCC)   . Diabetes mellitus without complication (HCC)   . Hepatitis C    unsure   . Homelessness   . Hypertension   . Neuropathy   . Polysubstance abuse (HCC)   . Schizophrenia (HCC)   . Sleep apnea   . Systolic and diastolic CHF, chronic [facility])     Patient Active Problem List   Diagnosis Date Noted  . Hypokalemia 09/17/2018  . Scrotal swelling 09/17/2018  . Urinary hesitancy   . Constipation   . Acute on chronic combined systolic and diastolic CHF (congestive heart failure) (HCC) 08/25/2018  . Acute respiratory failure with hypoxia (HCC) 08/25/2018  . Acute on chronic combined systolic and diastolic congestive heart failure (HCC) 08/18/2018  . Tobacco use 08/18/2018  . 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 Homelessness 08/08/2018  . Smoker 08/08/2018  . Acute exacerbation of CHF (congestive heart failure) (HCC) 03/28/2018  . Prostate enlargement 03/16/2018  . Aortic atherosclerosis (HCC) 03/16/2018  . 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  Chronic foot pain   . Schizoaffective disorder, bipolar type (HCC) 09/30/2016  . Substance induced mood disorder (HCC) 03/13/2015  . Acute kidney failure (HCC) 01/26/2015  . Schizophrenia, paranoid type (HCC) 01/17/2015  . Suicidal ideation   . Drug hallucinosis (HCC) 10/08/2014  . Chronic paranoid schizophrenia (HCC) 09/07/2014  . 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 Urinary retention   . Cocaine use disorder, severe, dependence (HCC)   . Essential hypertension, benign 03/28/2013  . 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 MULTIPLE TOOTH EXTRACTIONS          Home Medications    Prior to Admission medications   Medication Sig Start Date End Date Taking? Authorizing Provider  ARIPiprazole (ABILIFY) 10 MG tablet Take 1 tablet (10 mg total) by mouth daily. 09/18/18   Meredeth Ide, MD  aspirin EC 81 MG tablet Take 1 tablet (81 mg total) by mouth daily for 30 days. 09/18/18 10/18/18  Meredeth Ide, MD  atorvastatin (LIPITOR) 40 MG tablet Take 1 tablet (40 mg total) by mouth daily. 09/18/18   Meredeth Ide, MD  bisacodyl (DULCOLAX) 5 MG EC tablet Take 1 tablet (5 mg total) by mouth daily as needed for moderate constipation. 09/04/18   Rodolph Bong, MD  carbamazepine (TEGRETOL) 200 MG tablet Take 0.5 tablets (100 mg total) by mouth  2 (two) times daily. 09/18/18   Meredeth Ide, MD  carvedilol (COREG) 6.25 MG tablet Take 1 tablet (6.25 mg total) by mouth 2 (two) times daily with a meal. 09/18/18   Meredeth Ide, MD  furosemide (LASIX) 40 MG tablet Take 1 tablet (40 mg total) by mouth daily. 09/18/18   Meredeth Ide, MD  Insulin Glargine (LANTUS) 100 UNIT/ML Solostar Pen Inject 14 Units into the skin daily. 09/18/18   Meredeth Ide, MD  Insulin Pen Needle 29G X MISC Use with insulin 09/18/18   Meredeth Ide, MD  lisinopril  (PRINIVIL,ZESTRIL) 10 MG tablet Take 1 tablet (10 mg total) by mouth daily. 09/18/18   Meredeth Ide, MD  nicotine (NICODERM CQ - DOSED IN MG/24 HOURS) 21 mg/24hr patch Place 1 patch (21 mg total) onto the skin daily. 09/04/18   Rodolph Bong, MD  polyethylene glycol [facility] / Ethelene Hal) packet Take 17 g by mouth 2 (two) times daily. 09/18/18   Meredeth Ide, MD  Potassium Chloride ER 20 MEQ TBCR Take 20 mEq by mouth daily. 09/18/18   Meredeth Ide, MD  senna-docusate (SENOKOT-S) 8.6-50 MG tablet Take 1 tablet by mouth 2 (two) times daily. 09/04/18   Rodolph Bong, MD  tamsulosin (FLOMAX) 0.4 MG CAPS capsule Take 1 capsule (0.4 mg total) by mouth daily after breakfast. 09/18/18   Meredeth Ide, MD    Family History Family History  Problem Relation Age of Onset  . Hypertension Other   . Diabetes Other     Social History Social History   Tobacco Use  . Smoking status: Current Every Day Smoker    Packs/day: 1.00    Years: 20.00    Pack years: 20.00    Types: Cigarettes  . Smokeless tobacco: Current User  Substance Use Topics  . Alcohol use: Yes    Comment: Daily Drinker   . Drug use: Yes    Frequency: 7.0 times per week    Types: "Crack" cocaine, Cocaine, Marijuana     Allergies   Haldol [haloperidol]   Review of Systems Review of Systems  Constitutional: Positive for activity change.  Respiratory: Negative for shortness of breath.   Cardiovascular: Negative for chest pain.  All other systems reviewed and are negative.    Physical Exam Updated Vital Signs BP (!) 164/99   Pulse (!) 102   Temp 98.2 F (36.8 C)   Resp 20   Ht  (1.753 m)   Wt 93 kg   SpO2 97%   BMI 30.28 kg/m   Physical Exam Vitals signs and nursing note reviewed.  Constitutional:      Appearance: He is well-developed.  HENT:     Head: Atraumatic.  Neck:     Musculoskeletal: Neck supple.  Cardiovascular:     Rate and Rhythm: Normal rate.  Pulmonary:     Effort: Pulmonary effort  is normal.  Genitourinary:    Comments: Scrotal edema Musculoskeletal:     Right lower leg: Edema present.     Left lower leg: Edema present.  Skin:    General: Skin is warm.  Neurological:     Mental Status: He is alert and oriented to person, place, and time.      ED Treatments / Results  Labs (all labs ordered are listed, but only abnormal results are displayed) Labs Reviewed  CBG MONITORING, ED - Abnormal; Notable for the following components:      Result Value  Glucose-Capillary 161 (*)    All other components within normal limits    EKG None  Radiology Dg Chest Port 1 View  Result Date: 09/20/2018 CLINICAL DATA:  Initial evaluation for acute shortness of breath. EXAM: PORTABLE CHEST 1 VIEW COMPARISON:  Prior radiograph from 09/17/2018 FINDINGS: Moderate cardiomegaly, stable. Mediastinal silhouette within normal limits. Lungs normally inflated. Mild diffuse pulmonary interstitial congestion/edema. Superimposed left perihilar scarring, similar to previous. No focal infiltrates. No pneumothorax. No visible pleural effusion. No acute osseous finding. Degenerative changes noted about the partially visualized right shoulder. IMPRESSION: 1. Cardiomegaly with mild diffuse pulmonary interstitial congestion/edema. 2. Superimposed left perihilar scarring. Electronically Signed   By: [name]   On: 09/20/2018 06:36    Procedures Procedures (including critical care time)  Medications Ordered in ED Medications  carvedilol (COREG) tablet 6.25 mg (6.25 mg Oral Given 09/21/18 0539)  furosemide (LASIX) tablet 40 mg (40 mg Oral Given 09/21/18 0504)  lisinopril (PRINIVIL,ZESTRIL) tablet 10 mg (10 mg Oral Given 09/21/18 0505)  tamsulosin (FLOMAX) capsule 0.4 mg (0.4 mg Oral Given 09/21/18 0505)     Initial Impression / Assessment and Plan / ED Course  I have reviewed the triage vital signs and the nursing notes.  Pertinent labs & imaging results that were available  during my care of the patient were reviewed by me and considered in my medical decision making (see chart for details).        Patient comes in a chief complaint of swelling in his legs and medication issues.  He was just discharged from the hospital 3 days ago.  Clinically, he does not appear to be any worse than when he was discharged, as at that time he also had edema of his scrotum and lower extremities.  Patient admits to noncompliance with his medications.  We will give him his home meds right now.  Patient does not have any shortness of breath and the exam does not reveal any rales.  There is no hypoxia or respiratory distress.  He will be discharged thereafter.   Final Clinical Impressions(s) / ED Diagnoses   Final diagnoses:  Chronic congestive heart failure, unspecified heart failure type [facility])    ED Discharge Orders    None       Derwood Kaplan, MD 09/21/18 [phone]

## 2018-09-21 NOTE — ED Triage Notes (Signed)
Pt came to the ED due to complaints of scrotum swelling and htn.  Pt reports using cocaine yesterday.

## 2018-09-21 NOTE — ED Provider Notes (Addendum)
[facility] EMERGENCY DEPARTMENT Provider Note   CSN: [id number] Arrival date & time: 09/21/18  1727    History   Chief Complaint Chief Complaint  Patient presents with  . Suicidal  . Groin Pain  . Chest Pain  . Leg Swelling    HPI [name] is a 58 y.o. male.      58 y/o AAM homeless and well known to our ED for visits related to such, presents to the ED for groin swelling, chest pain and SI.He has hx of CHF, HTN, polysubstance abuse, diabetes.  Patient was evaluated in the ED this morning for the groin swelling and chest pain and was diuresed with IV Lasix and sent home.  He reports that this is gotten worse.  Pt reports chest pain started yesterday. Pt also endorses SI with plan to stab himself to death and paranoia. Pt arrives covered in feces in urine stating he can't clean himself because he is homeless. Pt reports that he must be admitted to the hospital.  Reports that he does not take any of his medications.  Reports that he is now also having shortness of breath.  Patient has had a history of the groin pain in the past related to anasarca.      Past Medical History:  Diagnosis Date  . Chronic foot pain   . Cocaine abuse (HCC)   . Diabetes mellitus without complication (HCC)   . Hepatitis C    unsure   . Homelessness   . Hypertension   . Neuropathy   . Polysubstance abuse (HCC)   . Schizophrenia (HCC)   . Sleep apnea   . Systolic and diastolic CHF, chronic [facility])     Patient Active Problem List   Diagnosis Date Noted  . Hypokalemia 09/17/2018  . Scrotal swelling 09/17/2018  . Urinary hesitancy   . Constipation   . Acute on chronic combined systolic and diastolic CHF (congestive heart failure) (HCC) 08/25/2018  . Acute respiratory failure with hypoxia (HCC) 08/25/2018  . Acute on chronic combined systolic and diastolic congestive heart failure (HCC) 08/18/2018  . Tobacco use 08/18/2018  . Acute on chronic combined systolic  (congestive) and diastolic (congestive) heart failure (HCC) 08/08/2018  . Homelessness 08/08/2018  . Smoker 08/08/2018  . Acute exacerbation of CHF (congestive heart failure) (HCC) 03/28/2018  . Prostate enlargement 03/16/2018  . Aortic atherosclerosis (HCC) 03/16/2018  . 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 Chronic foot pain   . Schizoaffective disorder, bipolar type (HCC) 09/30/2016  . Substance induced mood disorder (HCC) 03/13/2015  . Acute kidney failure (HCC) 01/26/2015  . Schizophrenia, paranoid type (HCC) 01/17/2015  . Suicidal ideation   . Drug hallucinosis (HCC) 10/08/2014  . Chronic paranoid schizophrenia (HCC) 09/07/2014  . 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 Urinary retention   . Cocaine use disorder, severe, dependence (HCC)   . Essential hypertension, benign 03/28/2013  . 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 MULTIPLE TOOTH EXTRACTIONS          Home Medications    Prior to Admission medications   Medication Sig Start Date End Date Taking? Authorizing Provider  ARIPiprazole (ABILIFY) 10 MG tablet Take 1 tablet (10 mg total) by mouth daily. 09/18/18   Meredeth Ide, MD  aspirin EC 81 MG tablet Take 1 tablet (81 mg total) by mouth  daily for 30 days. 09/18/18 10/18/18  Meredeth Ide, MD  atorvastatin (LIPITOR) 40 MG tablet Take 1 tablet (40 mg total) by mouth daily. 09/18/18   Meredeth Ide, MD  bisacodyl (DULCOLAX) 5 MG EC tablet Take 1 tablet (5 mg total) by mouth daily as needed for moderate constipation. 09/04/18   Rodolph Bong, MD  carbamazepine (TEGRETOL) 200 MG tablet Take 0.5 tablets (100 mg total) by mouth 2 (two) times daily. 09/18/18   Meredeth Ide, MD  carvedilol (COREG) 6.25 MG tablet Take 1 tablet (6.25 mg total) by mouth 2 (two) times daily with a meal. 09/18/18   Meredeth Ide, MD  furosemide (LASIX) 40 MG tablet Take 1 tablet (40 mg total) by mouth daily. 09/18/18   Meredeth Ide, MD  Insulin Glargine (LANTUS) 100 UNIT/ML Solostar Pen Inject 14 Units into the skin daily. 09/18/18   Meredeth Ide, MD  Insulin Pen Needle 29G X MISC Use with insulin 09/18/18   Meredeth Ide, MD  lisinopril (PRINIVIL,ZESTRIL) 10 MG tablet Take 1 tablet (10 mg total) by mouth daily. 09/18/18   Meredeth Ide, MD  nicotine (NICODERM CQ - DOSED IN MG/24 HOURS) 21 mg/24hr patch Place 1 patch (21 mg total) onto the skin daily. 09/04/18   Rodolph Bong, MD  polyethylene glycol [facility] / Ethelene Hal) packet Take 17 g by mouth 2 (two) times daily. 09/18/18   Meredeth Ide, MD  Potassium Chloride ER 20 MEQ TBCR Take 20 mEq by mouth daily. 09/18/18   Meredeth Ide, MD  senna-docusate (SENOKOT-S) 8.6-50 MG tablet Take 1 tablet by mouth 2 (two) times daily. 09/04/18   Rodolph Bong, MD  tamsulosin (FLOMAX) 0.4 MG CAPS capsule Take 1 capsule (0.4 mg total) by mouth daily after breakfast. 09/18/18   Meredeth Ide, MD    Family History Family History  Problem Relation Age of Onset  . Hypertension Other   . Diabetes Other     Social History Social History   Tobacco Use  . Smoking status: Current Every Day Smoker    Packs/day: 1.00    Years: 20.00    Pack years: 20.00    Types: Cigarettes  . Smokeless tobacco: Current User  Substance Use Topics  . Alcohol use: Yes    Comment: Daily Drinker   . Drug use: Yes    Frequency: 7.0 times per week    Types: "Crack" cocaine, Cocaine, Marijuana     Allergies   Haldol [haloperidol]   Review of Systems Review of Systems  Constitutional: Negative for appetite change, chills and fever.  HENT: Negative for congestion and sore throat.   Respiratory: Positive for cough and shortness of breath. Negative for apnea, choking, chest tightness, wheezing and stridor.   Cardiovascular: Positive for chest pain and leg swelling. Negative for  palpitations.  Gastrointestinal: Negative for abdominal distention, abdominal pain, nausea and vomiting.  Genitourinary: Positive for scrotal swelling. Negative for decreased urine volume, dysuria, frequency, penile swelling, testicular pain and urgency.  Musculoskeletal: Negative for arthralgias.  Skin: Negative for rash and wound.  Neurological: Negative for dizziness and headaches.     Physical Exam Updated Vital Signs BP (!) 142/88   Pulse 97   Resp 12   SpO2 98%   Physical Exam Vitals signs and nursing note reviewed.  Constitutional:      General: He is not in acute distress.    Appearance: He is not ill-appearing, toxic-appearing or diaphoretic.  Comments: Unkempt appearing and smelling of urine  HENT:     Head: Normocephalic and atraumatic.     Nose: Nose normal.     Mouth/Throat:     Mouth: Mucous membranes are moist.  Eyes:     Conjunctiva/sclera: Conjunctivae normal.     Pupils: Pupils are equal, round, and reactive to light.  Cardiovascular:     Rate and Rhythm: Normal rate and regular rhythm.     Heart sounds: Normal heart sounds.  Pulmonary:     Effort: Pulmonary effort is normal.     Breath sounds: Normal breath sounds. No wheezing, rhonchi or rales.  Abdominal:     General: Bowel sounds are normal. There is no abdominal bruit.     Palpations: Abdomen is soft. There is no fluid wave.  Musculoskeletal:     Right lower leg: Edema present.     Left lower leg: Edema present.     Comments: Significant bilateral 4+ pitting edema.  Pitting edema up into the scrotum.  Significant scrotal edema.  Skin:    General: Skin is warm.     Capillary Refill: Capillary refill takes less than 2 seconds.  Neurological:     General: No focal deficit present.     Mental Status: He is alert.  Psychiatric:        Mood and Affect: Mood normal.      ED Treatments / Results  Labs (all labs ordered are listed, but only abnormal results are displayed) Labs Reviewed   BASIC METABOLIC PANEL - Abnormal; Notable for the following components:      Result Value   Glucose, Bld 241 (*)    BUN 25 (*)    All other components within normal limits  BRAIN NATRIURETIC PEPTIDE - Abnormal; Notable for the following components:   B Natriuretic Peptide 1,071.0 (*)    All other components within normal limits  CBC WITH DIFFERENTIAL/PLATELET - Abnormal; Notable for the following components:   Hemoglobin 10.9 (*)    HCT 37.8 (*)    MCH 24.1 (*)    MCHC 28.8 (*)    RDW 17.0 (*)    All other components within normal limits  URINALYSIS, ROUTINE W REFLEX MICROSCOPIC - Abnormal; Notable for the following components:   Color, Urine STRAW (*)    All other components within normal limits  RAPID URINE DRUG SCREEN, HOSP PERFORMED - Abnormal; Notable for the following components:   Cocaine POSITIVE (*)    All other components within normal limits  ACETAMINOPHEN LEVEL - Abnormal; Notable for the following components:   Acetaminophen (Tylenol), Serum <10 (*)    All other components within normal limits  TROPONIN I  ETHANOL  SALICYLATE LEVEL    EKG EKG Interpretation  Date/Time:  Monday September 21 2018 17:37:09 EDT Ventricular Rate:  99 PR Interval:    QRS Duration: 85 QT Interval:  370 QTC Calculation: 475 R Axis:   -24 Text Interpretation:  Sinus rhythm Probable left atrial enlargement Borderline left axis deviation Anterior infarct, old Nonspecific T abnormalities, lateral leads When compared to prior, no significant changes seen.  No STEMI Confirmed by Theda Belfast ([id number]) on 09/21/2018 5:43:45 PM   Radiology Dg Chest 2 View  Result Date: 09/21/2018 CLINICAL DATA:  Chest pain, groin swelling, suicidal ideation EXAM: CHEST - 2 VIEW COMPARISON:  09/20/2018 FINDINGS: Linear scarring in the lingula. Lungs otherwise clear. No pleural effusion or pneumothorax. Cardiomegaly. Mild degenerative changes of the visualized thoracolumbar spine. IMPRESSION: No evidence of acute  cardiopulmonary disease. Electronically Signed   By: Charline Bills M.D.   On: 09/21/2018 19:04   Dg Chest Port 1 View  Result Date: 09/20/2018 CLINICAL DATA:  Initial evaluation for acute shortness of breath. EXAM: PORTABLE CHEST 1 VIEW COMPARISON:  Prior radiograph from 09/17/2018 FINDINGS: Moderate cardiomegaly, stable. Mediastinal silhouette within normal limits. Lungs normally inflated. Mild diffuse pulmonary interstitial congestion/edema. Superimposed left perihilar scarring, similar to previous. No focal infiltrates. No pneumothorax. No visible pleural effusion. No acute osseous finding. Degenerative changes noted about the partially visualized right shoulder. IMPRESSION: 1. Cardiomegaly with mild diffuse pulmonary interstitial congestion/edema. 2. Superimposed left perihilar scarring. Electronically Signed   By: [name]   On: 09/20/2018 06:36    Procedures Procedures (including critical care time)  Medications Ordered in ED Medications  acetaminophen (TYLENOL) tablet 650 mg (has no administration in time range)  furosemide (LASIX) injection 80 mg (80 mg Intravenous Given 09/21/18 1826)  acetaminophen (TYLENOL) tablet 650 mg (650 mg Oral Given 09/21/18 1810)     Initial Impression / Assessment and Plan / ED Course  I have reviewed the triage vital signs and the nursing notes.  Pertinent labs & imaging results that were available during my care of the patient were reviewed by me and considered in my medical decision making (see chart for details).  Clinical Course as of Sep 20 2052  Mon Sep 21, 2018  [id number] 58 year old homeless man with past medical history of CHF presents to the emergency department for the same.  He is a well-known patient to the ED and often comes in to the emergency department for food admission due to his homelessness.  Patient today demands Tylenol, crackers, juice.  He does have significant edema in his legs and scrotum which is not new.  He was  just seen this morning.  His labs are baseline.  Chest x-ray clear and lungs sound clear.  He was diuresed with IV Lasix.  He also reports that he is suicidal and wants to stab himself to death.  I will consult with TTS   [KM]    Clinical Course User Index [KM] Arlyn Dunning, PA-C         Final Clinical Impressions(s) / ED Diagnoses   Final diagnoses:  Edema, unspecified type  Suicidal ideation    ED Discharge Orders    None       Arlyn Dunning, PA-C 09/21/18 2050    Arlyn Dunning, PA-C 09/21/18 2054    Tegeler, Canary Brim, MD 09/22/18 0002

## 2018-09-21 NOTE — ED Notes (Signed)
ED Provider at bedside. 

## 2018-09-21 NOTE — ED Notes (Signed)
Patient transported to X-ray 

## 2018-09-21 NOTE — ED Notes (Signed)
Bed: WA20 Expected date:  Expected time:  Means of arrival:  Comments: hypertension

## 2018-09-21 NOTE — ED Notes (Signed)
Pt given meal at this time.

## 2018-09-21 NOTE — ED Notes (Signed)
Pt was arguing  with staff due to not getting any more snacks, Pt remains in room with sitter at [facility]

## 2018-09-21 NOTE — ED Notes (Addendum)
Pt received meal prior to leaving room 45. Pt wanded by security and wearing purple scrubs. Pt ambulatory .

## 2018-09-21 NOTE — ED Triage Notes (Addendum)
Pt arrives via [facility] for groin swelling and chest pain. Pt at [facility] this am and [facility] yesterday. Pt reports chest pain started yesterday. Pt also endorses SI with plan to stab himself to death and paranoia. Pt arrives covered in feces in urine stating he can't clean himself because he is homeless. Pt states 9/10 chest pain and groin pain.

## 2018-09-21 NOTE — BH Assessment (Addendum)
Assessment Note  [name] is an 58 y.o. male.  The pt came in due to SI.  The pt told the RN he wants to stab himself.  He is now saying he wants to shoot himself.  The pt denies having access to a gun.  He stated he had one suicide attempt in the past of banging his head on concrete 3 years ago.  The pt has had several ED visits in the past week.  He has made visits to the ED on the following dates: twice 09/21/2018, 3/22, 3/17, and 3/16.  The pt has a Veterinary surgeon at [employer] and last went there a month ago.  The pt is currently homeless.  The pt denies self harm, HI, and history of abuse.  The pt has court dates in April for trespassing.  The pt reports seeing demons.  He isn't sleeping or eating well.  The pt reports using cocaine and heroin today.  His UDS is positive for cocaine.  Pt is dressed in scrubs. He is alert and oriented x4. Pt speaks in a clear tone, at moderate volume and rapid pace. Eye contact is good. Pt's mood is irritated.  He was yelling at ED staff and demanding he get Cheree Ditto crackers and milk in the beginning of the assessment. Thought process is coherent and relevant.?Pt was semi-cooperative throughout assessment.    Diagnosis:  F25.1 Schizoaffective disorder, Depressive type F11.20 Opioid use disorder, Severe  F14.20 Cocaine use disorder, Severe  Past Medical History:  Past Medical History:  Diagnosis Date  . Chronic foot pain   . Cocaine abuse (HCC)   . Diabetes mellitus without complication (HCC)   . Hepatitis C    unsure   . Homelessness   . Hypertension   . Neuropathy   . Polysubstance abuse (HCC)   . Schizophrenia (HCC)   . Sleep apnea   . Systolic and diastolic CHF, chronic (HCC)     Past Surgical History:  Procedure Laterality Date  . MULTIPLE TOOTH EXTRACTIONS      Family History:  Family History  Problem Relation Age of Onset  . Hypertension Other   . Diabetes Other     Social History:  reports that he has been smoking  cigarettes. He has a 20.00 pack-year smoking history. He uses smokeless tobacco. He reports current alcohol use. He reports current drug use. Frequency: 7.00 times per week. Drugs: "Crack" cocaine, Cocaine, and Marijuana.  Additional Social History:     CIWA: CIWA-Ar BP: (!) 142/88 Pulse Rate: 97 COWS:    Allergies:  Allergies  Allergen Reactions  . Haldol [Haloperidol] Other (See Comments)    Muscle spasms, loss of voluntary movement. However, pt has taken Thorazine on multiple occasions with no adverse effects.     Home Medications: (Not in a hospital admission)   OB/GYN Status:  No LMP for male patient.  General Assessment Data Location of Assessment: [facility] ED TTS Assessment: In system Is this a Tele or Face-to-Face Assessment?: Face-to-Face Is this an Initial Assessment or a Re-assessment for this encounter?: Initial Assessment Patient Accompanied by:: N/A Language Other than English: No Living Arrangements: Homeless/Shelter What gender do you identify as?: Male Marital status: Divorced Living Arrangements: Other (Comment)(homeless) Can pt return to current living arrangement?: Yes Admission Status: Voluntary Is patient capable of signing voluntary admission?: Yes Referral Source: Self/Family/Friend Insurance type: Medicare     Crisis Care Plan Living Arrangements: Other (Comment)(homeless) Legal Guardian: Other:(Self) Name of Psychiatrist: [name] Name of Therapist: none  Education Status Is patient currently in school?: No Is the patient employed, unemployed or receiving disability?: Unemployed  Risk to self with the past 6 months Suicidal Ideation: Yes-Currently Present Has patient been a risk to self within the past 6 months prior to admission? : Yes Suicidal Intent: Yes-Currently Present Has patient had any suicidal intent within the past 6 months prior to admission? : Yes Is patient at risk for suicide?: Yes Suicidal Plan?: Yes-Currently Present Has  patient had any suicidal plan within the past 6 months prior to admission? : Yes Specify Current Suicidal Plan: shoot self Access to Means: No What has been your use of drugs/alcohol within the last 12 months?: cocaine and heroin use Previous Attempts/Gestures: Yes How many times?: 1(unknown) Other Self Harm Risks: none Triggers for Past Attempts: Unpredictable Intentional Self Injurious Behavior: None Family Suicide History: No Recent stressful life event(s): Other (Comment)(homeless) Persecutory voices/beliefs?: No Depression: Yes Depression Symptoms: Despondent Substance abuse history and/or treatment for substance abuse?: No Suicide prevention information given to non-admitted patients: Not applicable  Risk to Others within the past 6 months Homicidal Ideation: No Does patient have any lifetime risk of violence toward others beyond the six months prior to admission? : No Thoughts of Harm to Others: No Comment - Thoughts of Harm to Others: none Current Homicidal Intent: No Current Homicidal Plan: No Access to Homicidal Means: No Identified Victim: none History of harm to others?: No Assessment of Violence: None Noted Violent Behavior Description: yelling at staff Does patient have access to weapons?: No Criminal Charges Pending?: Yes Describe Pending Criminal Charges: trespassing, possesion of drugs Does patient have a court date: Yes Court Date: 10/13/18 Is patient on probation?: No  Psychosis Hallucinations: Auditory, Visual Delusions: None noted  Mental Status Report Appearance/Hygiene: In hospital gown Eye Contact: Good Motor Activity: Unable to assess Speech: Rapid Level of Consciousness: Restless Mood: Irritable Affect: Anxious Anxiety Level: Moderate Thought Processes: Tangential Judgement: Impaired Orientation: Person, Place, Time, Situation Obsessive Compulsive Thoughts/Behaviors: None  Cognitive Functioning Concentration: Normal Memory: Recent  Intact, Remote Intact Is patient IDD: No Insight: Poor Impulse Control: Poor Appetite: Good Have you had any weight changes? : No Change Sleep: Decreased Total Hours of Sleep: 4 Vegetative Symptoms: None  ADLScreening [facility] Assessment Services) Patient's cognitive ability adequate to safely complete daily activities?: Yes Patient able to express need for assistance with ADLs?: Yes Independently performs ADLs?: Yes (appropriate for developmental age)  Prior Inpatient Therapy Prior Inpatient Therapy: Yes Prior Therapy Dates: Multiple admits Prior Therapy Facilty/Provider(s): Multiple facilities Reason for Treatment: Schizophrenia, substance abuse  Prior Outpatient Therapy Prior Outpatient Therapy: Yes Prior Therapy Dates: current Prior Therapy Facilty/Provider(s): [name] Reason for Treatment: Schizophrenia, substance abuse Does patient have an ACCT team?: No Does patient have Intensive In-House Services?  : No Does patient have [name] services? : Yes Does patient have P4CC services?: Unknown  ADL Screening (condition at time of admission) Patient's cognitive ability adequate to safely complete daily activities?: Yes Patient able to express need for assistance with ADLs?: Yes Independently performs ADLs?: Yes (appropriate for developmental age)       Abuse/Neglect Assessment (Assessment to be complete while patient is alone) Abuse/Neglect Assessment Can Be Completed: Yes Physical Abuse: Denies Verbal Abuse: Denies Sexual Abuse: Denies Exploitation of patient/patient's resources: Denies Self-Neglect: Denies Values / Beliefs Cultural Requests During Hospitalization: None Spiritual Requests During Hospitalization: None Consults Spiritual Care Consult Needed: No Social Work Consult Needed: No  Disposition:  Disposition Initial Assessment Completed for this Encounter: Yes   NP Nira Conn recommends the pt be observed overnight for safety and  stabilization.  The pt is to be reassessed by psychiatry in the morning.  RN and MD were made aware of the recommendations.   On Site Evaluation by:   Reviewed with Physician:    Ottis Stain 09/21/2018 9:10 PM

## 2018-09-22 ENCOUNTER — Encounter (HOSPITAL_COMMUNITY): Payer: Self-pay | Admitting: Registered Nurse

## 2018-09-22 DIAGNOSIS — F250 Schizoaffective disorder, bipolar type: Secondary | ICD-10-CM

## 2018-09-22 LAB — CBG MONITORING, ED: Glucose-Capillary: 257 mg/dL — ABNORMAL HIGH (ref 70–99)

## 2018-09-22 MED ORDER — LISINOPRIL 10 MG PO TABS
10.0000 mg | ORAL_TABLET | Freq: Every day | ORAL | Status: DC
  Administered 2018-09-22: 10 mg via ORAL
  Filled 2018-09-22: qty 1

## 2018-09-22 MED ORDER — ARIPIPRAZOLE 10 MG PO TABS
10.0000 mg | ORAL_TABLET | Freq: Every day | ORAL | Status: DC
  Administered 2018-09-22: 10 mg via ORAL
  Filled 2018-09-22: qty 1

## 2018-09-22 MED ORDER — FUROSEMIDE 20 MG PO TABS
40.0000 mg | ORAL_TABLET | Freq: Every day | ORAL | Status: DC
  Administered 2018-09-22: 40 mg via ORAL
  Filled 2018-09-22: qty 2

## 2018-09-22 MED ORDER — ASPIRIN EC 81 MG PO TBEC
81.0000 mg | DELAYED_RELEASE_TABLET | Freq: Every day | ORAL | Status: DC
  Administered 2018-09-22: 81 mg via ORAL
  Filled 2018-09-22: qty 1

## 2018-09-22 MED ORDER — ATORVASTATIN CALCIUM 40 MG PO TABS
40.0000 mg | ORAL_TABLET | Freq: Every day | ORAL | Status: DC
  Administered 2018-09-22: 40 mg via ORAL
  Filled 2018-09-22: qty 1

## 2018-09-22 MED ORDER — CARVEDILOL 12.5 MG PO TABS
6.2500 mg | ORAL_TABLET | Freq: Two times a day (BID) | ORAL | Status: DC
  Administered 2018-09-22: 6.25 mg via ORAL
  Filled 2018-09-22: qty 1

## 2018-09-22 NOTE — Consult Note (Addendum)
[facility] Tele-Psychiatry Consult   Reason for Consult:  Suicidal ideation Referring Physician:  EDP Patient Identification: [name] MRN:  [id number] Principal Diagnosis: Schizoaffective disorder, bipolar type [facility]) Diagnosis:   Patient Active Problem List   Diagnosis Date Noted  . Hypokalemia [E87.6] 09/17/2018  . Scrotal swelling [N50.89] 09/17/2018  . Urinary hesitancy [R39.11]   . Constipation [K59.00]   . Acute on chronic combined systolic and diastolic CHF (congestive heart failure) (HCC) [I50.43] 08/25/2018  . Acute respiratory failure with hypoxia (HCC) [J96.01] 08/25/2018  . Acute on chronic combined systolic and diastolic congestive heart failure (HCC) [I50.43] 08/18/2018  . Tobacco use [Z72.0] 08/18/2018  . Acute on chronic combined systolic (congestive) and diastolic (congestive) heart failure (HCC) [I50.43] 08/08/2018  . Homelessness [Z59.0] 08/08/2018  . Smoker [F17.200] 08/08/2018  . Acute exacerbation of CHF (congestive heart failure) (HCC) [I50.9] 03/28/2018  . Prostate enlargement [N40.0] 03/16/2018  . Aortic atherosclerosis (HCC) [I70.0] 03/16/2018  . Aneurysm of abdominal aorta (HCC) [I71.4] 03/16/2018  . Chronic foot pain [M79.673, G89.29]   . Schizoaffective disorder, bipolar type (HCC) [F25.0] 09/30/2016  . Substance induced mood disorder (HCC) [F19.94] 03/13/2015  . Acute kidney failure (HCC) [N17.9] 01/26/2015  . Schizophrenia, paranoid type (HCC) [F20.0] 01/17/2015  . Suicidal ideation [R45.851]   . 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 Substance or medication-induced bipolar and related disorder with onset during intoxication (HCC) [Q76.226, F19.94] 08/10/2014  . Urinary retention [R33.9]   . Cocaine use disorder, severe, dependence (HCC) [F14.20]   . Essential hypertension, benign [I10] 03/28/2013  . 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 male patient presented to [facility] with complaints of suicidal ideation. He reports resuming his medications has helped him and now is ready to go. He does report receiving his check and he has a pay he will make sure he does not use it on drugs. He states he is seen at [facility] where they prescribe Tegretol and Abilify. He is currently on disability at this time and is receiving a check for disability.   HPI:    [name] is an 58 y.o. male.  The pt came in due to SI.  The pt told the RN he wants to stab himself.  He is now saying he wants to shoot himself.  The pt denies having access to a gun.  He stated he had one suicide attempt in the past of banging his head on concrete 3 years ago.  The pt has had several ED visits in the past week.  He has made visits to the ED on the following dates: twice 09/21/2018, 3/22, 3/17, and 3/16.  The pt has a Veterinary surgeon at [employer] and last went there a month ago.  The pt is currently homeless.  The pt denies self harm, HI, and history of abuse.  The pt has court dates in April for trespassing.  The pt reports seeing demons.  He isn't sleeping or eating well.  The pt reports using cocaine and heroin today.  His UDS is positive for cocaine.  Pt is dressed in scrubs. He is alert and oriented x4. Pt speaks in a clear tone, at moderate volume and rapid pace. Eye contact is good. Pt's mood is irritated.  He was yelling at ED staff and demanding he get Cheree Ditto  crackers and milk in the beginning of the assessment. Thought process is coherent and relevant.?Pt was semi-cooperative throughout assessment.   Past Psychiatric History: Cocaine dependence/abuse, Substance induced mood disorder, Schizophrenia   Risk to Self: Suicidal Ideation: Yes-Currently Present Suicidal Intent: At times endorses intent  Is patient at risk for suicide?: Yes Suicidal Plan?:  No Access to Means: No (STS NO ACCESS TO GUNS) What has been your use of drugs/alcohol within the last 12 months?:  (DAILY USE OF COCAINE PER PT) How many times?:  (0) Other Self Harm Risks:  (NONE REPORTED) Triggers for Past Attempts: None known Intentional Self Injurious Behavior: None Risk to Others: Homicidal Ideation: No Thoughts of Harm to Others: No Comment - Thoughts of Harm to Others: none Current Homicidal Intent: No Current Homicidal Plan: No Access to Homicidal Means: No Identified Victim: none History of harm to others?: No Assessment of Violence: None Noted Violent Behavior Description: yelling at staff Does patient have access to weapons?: No Criminal Charges Pending?: Yes Describe Pending Criminal Charges: trespassing, possesion of drugs Does patient have a court date: Yes Court Date: 10/13/18 Prior Inpatient Therapy: Prior Inpatient Therapy: Yes Prior Therapy Dates: Multiple admits Prior Therapy Facilty/Provider(s): Multiple facilities Reason for Treatment: Schizophrenia, substance abuse Prior Outpatient Therapy: Prior Outpatient Therapy: Yes Prior Therapy Dates: current Prior Therapy Facilty/Provider(s): [name] Reason for Treatment: Schizophrenia, substance abuse Does patient have an ACCT team?: No Does patient have Intensive In-House Services?  : No Does patient have [name] services? : Yes Does patient have P4CC services?: Unknown  Past Medical History:  Past Medical History:  Diagnosis Date  . Chronic foot pain   . Cocaine abuse (HCC)   . Diabetes mellitus without complication (HCC)   . Hepatitis C    unsure   . Homelessness   . Hypertension   . Neuropathy   . Polysubstance abuse (HCC)   . Schizophrenia (HCC)   . Sleep apnea   . Systolic and diastolic CHF, chronic (HCC)     Past Surgical History:  Procedure Laterality Date  . MULTIPLE TOOTH EXTRACTIONS     Family History:  Family History  Problem Relation Age of Onset  . Hypertension  Other   . Diabetes Other    Family Psychiatric  History: Unaware Social History:  Social History   Substance and Sexual Activity  Alcohol Use Yes   Comment: Daily Drinker      Social History   Substance and Sexual Activity  Drug Use Yes  . Frequency: 7.0 times per week  . Types: "Crack" cocaine, Cocaine, Marijuana    Social History   Socioeconomic History  . Marital status: Single    Spouse name: Not on file  . Number of children: Not on file  . Years of education: Not on file  . Highest education level: Not on file  Occupational History  . Not on file  Social Needs  . Financial resource strain: Not on file  . Food insecurity:    Worry: Not on file    Inability: Not on file  . Transportation needs:    Medical: Not on file    Non-medical: Not on file  Tobacco Use  . Smoking status: Current Every Day Smoker    Packs/day: 1.00    Years: 20.00    Pack years: 20.00    Types: Cigarettes  . Smokeless tobacco: Current User  Substance and Sexual Activity  . Alcohol use: Yes    Comment: Daily Drinker   . Drug use:  Yes    Frequency: 7.0 times per week    Types: "Crack" cocaine, Cocaine, Marijuana  . Sexual activity: Not Currently  Lifestyle  . Physical activity:    Days per week: Not on file    Minutes per session: Not on file  . Stress: Not on file  Relationships  . Social connections:    Talks on phone: Not on file    Gets together: Not on file    Attends religious service: Not on file    Active member of club or organization: Not on file    Attends meetings of clubs or organizations: Not on file    Relationship status: Not on file  Other Topics Concern  . Not on file  Social History Narrative   ** Merged History Encounter **       Additional Social History:    Allergies:   Allergies  Allergen Reactions  . Haldol [Haloperidol] Other (See Comments)    Muscle spasms, loss of voluntary movement. However, pt has taken Thorazine on multiple occasions with  no adverse effects.     Labs:  Results for orders placed or performed during the hospital encounter of 09/21/18 (from the past 48 hour(s))  Basic metabolic panel     Status: Abnormal   Collection Time: 09/21/18  6:30 PM  Result Value Ref Range   Sodium 137 135 - 145 mmol/L   Potassium 3.7 3.5 - 5.1 mmol/L   Chloride 100 98 - 111 mmol/L   CO2 25 22 - 32 mmol/L   Glucose, Bld 241 (H) 70 - 99 mg/dL   BUN 25 (H) 6 - 20 mg/dL   Creatinine, Ser 1.61 0.61 - 1.24 mg/dL   Calcium 8.9 8.9 - 09.6 mg/dL   GFR calc non Af Amer >60 >60 mL/min   GFR calc Af Amer >60 >60 mL/min   Anion gap 12 5 - 15    Comment: Performed at [facility], [address], [place], [place] [address]  Brain natriuretic peptide     Status: Abnormal   Collection Time: 09/21/18  6:30 PM  Result Value Ref Range   B Natriuretic Peptide 1,071.0 (H) 0.0 - 100.0 pg/mL    Comment: Performed at [facility], [address] [place], [place] [address]  CBC with Differential     Status: Abnormal   Collection Time: 09/21/18  6:30 PM  Result Value Ref Range   WBC 8.6 4.0 - 10.5 K/uL   RBC 4.52 4.22 - 5.81 MIL/uL   Hemoglobin 10.9 (L) 13.0 - 17.0 g/dL   HCT 14.7 (L) 82.9 - 56.2 %   MCV 83.6 80.0 - 100.0 fL   MCH 24.1 (L) 26.0 - 34.0 pg   MCHC 28.8 (L) 30.0 - 36.0 g/dL   RDW 13.0 (H) 86.5 - 78.4 %   Platelets 264 150 - 400 K/uL   nRBC 0.0 0.0 - 0.2 %   Neutrophils Relative % 84 %   Neutro Abs 7.2 1.7 - 7.7 K/uL   Lymphocytes Relative 11 %   Lymphs Abs 1.0 0.7 - 4.0 K/uL   Monocytes Relative 4 %   Monocytes Absolute 0.4 0.1 - 1.0 K/uL   Eosinophils Relative 1 %   Eosinophils Absolute 0.0 0.0 - 0.5 K/uL   Basophils Relative 0 %   Basophils Absolute 0.0 0.0 - 0.1 K/uL   Immature Granulocytes 0 %   Abs Immature Granulocytes 0.03 0.00 - 0.07 K/uL    Comment: Performed at  [facility], [address] [place], [place] [address]  Troponin I - Once     Status: None   Collection Time: 09/21/18  6:30 PM   Result Value Ref Range   Troponin I <0.03 <0.03 ng/mL    Comment: Performed at [facility], [address], [place], [place] [address]  Urine rapid drug screen (hosp performed)     Status: Abnormal   Collection Time: 09/21/18  7:33 PM  Result Value Ref Range   Opiates NONE DETECTED NONE DETECTED   Cocaine POSITIVE (A) NONE DETECTED   Benzodiazepines NONE DETECTED NONE DETECTED   Amphetamines NONE DETECTED NONE DETECTED   Tetrahydrocannabinol NONE DETECTED NONE DETECTED   Barbiturates NONE DETECTED NONE DETECTED    Comment: (NOTE) DRUG SCREEN FOR MEDICAL PURPOSES ONLY.  IF CONFIRMATION IS NEEDED FOR ANY PURPOSE, NOTIFY LAB WITHIN 5 DAYS. LOWEST DETECTABLE LIMITS FOR URINE DRUG SCREEN Drug Class                     Cutoff (ng/mL) Amphetamine and metabolites    1000 Barbiturate and metabolites    200 Benzodiazepine                 200 Tricyclics and metabolites     300 Opiates and metabolites        300 Cocaine and metabolites        300 THC                            50 Performed at [facility], [address] [place], [place] [address]   Ethanol     Status: None   Collection Time: 09/21/18  7:34 PM  Result Value Ref Range   Alcohol, Ethyl (B) <10 <10 mg/dL    Comment: (NOTE) Lowest detectable limit for serum alcohol is 10 mg/dL. For medical purposes only. Performed at [facility], [address] [place], [place] [address]   Urinalysis, Routine w reflex microscopic     Status: Abnormal   Collection Time: 09/21/18  7:47 PM  Result Value Ref Range   Color, Urine STRAW (A) YELLOW   APPearance CLEAR CLEAR   Specific Gravity, Urine 1.006 1.005 - 1.030   pH 7.0 5.0 - 8.0   Glucose, UA NEGATIVE NEGATIVE mg/dL   Hgb urine dipstick NEGATIVE NEGATIVE   Bilirubin Urine NEGATIVE NEGATIVE   Ketones, ur NEGATIVE NEGATIVE mg/dL   Protein, ur NEGATIVE NEGATIVE mg/dL   Nitrite NEGATIVE NEGATIVE   Leukocytes,Ua NEGATIVE NEGATIVE    Comment: Performed at  [facility], [address] [place], [place] [address]  Acetaminophen level     Status: Abnormal   Collection Time: 09/21/18  8:30 PM  Result Value Ref Range   Acetaminophen (Tylenol), Serum <10 (L) 10 - 30 ug/mL    Comment: (NOTE) Therapeutic concentrations vary significantly. A range of 10-30 ug/mL  may be an effective concentration for many patients. However, some  are best treated at concentrations outside of this range. Acetaminophen concentrations >150 ug/mL at 4 hours after ingestion  and >50 ug/mL at 12 hours after ingestion are often associated with  toxic reactions. Performed at [facility], [address], [place], [place] [address]   Salicylate level     Status: None   Collection Time: 09/21/18  8:30 PM  Result Value Ref Range   Salicylate Lvl <7.0 2.8 -  30.0 mg/dL    Comment: Performed at [facility], [address], [place], [place] [address]  CBG monitoring, ED     Status: Abnormal   Collection Time: 09/22/18  5:30 AM  Result Value Ref Range   Glucose-Capillary 257 (H) 70 - 99 mg/dL    Current Facility-Administered Medications  Medication Dose Route Frequency Provider Last Rate Last Dose  . acetaminophen (TYLENOL) tablet 650 mg  650 mg Oral Q4H PRN Ronnie Doss A, PA-C   650 mg at 09/22/18 0530  . ARIPiprazole (ABILIFY) tablet 10 mg  10 mg Oral Daily Zadie Rhine, MD   10 mg at 09/22/18 0859  . aspirin EC tablet 81 mg  81 mg Oral Daily Zadie Rhine, MD   81 mg at 09/22/18 0858  . atorvastatin (LIPITOR) tablet 40 mg  40 mg Oral Daily Zadie Rhine, MD   40 mg at 09/22/18 0858  . carvedilol (COREG) tablet 6.25 mg  6.25 mg Oral BID WC Zadie Rhine, MD   6.25 mg at 09/22/18 0858  . furosemide (LASIX) tablet 40 mg  40 mg Oral Daily Zadie Rhine, MD   40 mg at 09/22/18 0859  . lisinopril (PRINIVIL,ZESTRIL) tablet 10 mg  10 mg Oral Daily Zadie Rhine, MD   10 mg at 09/22/18 6045   Current Outpatient Medications  Medication Sig  Dispense Refill  . ARIPiprazole (ABILIFY) 10 MG tablet Take 1 tablet (10 mg total) by mouth daily. 30 tablet 1  . aspirin EC 81 MG tablet Take 1 tablet (81 mg total) by mouth daily for 30 days. 30 tablet 2  . atorvastatin (LIPITOR) 40 MG tablet Take 1 tablet (40 mg total) by mouth daily. 30 tablet 1  . bisacodyl (DULCOLAX) 5 MG EC tablet Take 1 tablet (5 mg total) by mouth daily as needed for moderate constipation. 30 tablet 0  . carbamazepine (TEGRETOL) 200 MG tablet Take 0.5 tablets (100 mg total) by mouth 2 (two) times daily. 60 tablet 2  . carvedilol (COREG) 6.25 MG tablet Take 1 tablet (6.25 mg total) by mouth 2 (two) times daily with a meal. 60 tablet 1  . furosemide (LASIX) 40 MG tablet Take 1 tablet (40 mg total) by mouth daily. 30 tablet 3  . Insulin Glargine (LANTUS) 100 UNIT/ML Solostar Pen Inject 14 Units into the skin daily. 15 mL 2  . Insulin Pen Needle 29G X MISC Use with insulin 100 each 0  . lisinopril (PRINIVIL,ZESTRIL) 10 MG tablet Take 1 tablet (10 mg total) by mouth daily. 30 tablet 1  . nicotine (NICODERM CQ - DOSED IN MG/24 HOURS) 21 mg/24hr patch Place 1 patch (21 mg total) onto the skin daily. 28 patch 0  . polyethylene glycol (MIRALAX / GLYCOLAX) packet Take 17 g by mouth 2 (two) times daily. 60 each 0  . Potassium Chloride ER 20 MEQ TBCR Take 20 mEq by mouth daily. 30 tablet 1  . senna-docusate (SENOKOT-S) 8.6-50 MG tablet Take 1 tablet by mouth 2 (two) times daily.    . tamsulosin (FLOMAX) 0.4 MG CAPS capsule Take 1 capsule (0.4 mg total) by mouth daily after breakfast. 30 capsule 1    Musculoskeletal:  Unable to assess via camera   Psychiatric Specialty Exam: Physical Exam  Nursing note and vitals reviewed. Constitutional: He is oriented to person, place, and time.  Neck: Normal range of motion.  Respiratory: Effort normal.  Neurological: He is alert and oriented to person, place, and time.  Psychiatric: He has  a normal mood and affect. His speech is  normal and behavior is normal. Thought content is not delusional. Cognition and memory are normal. He expresses impulsivity.    Review of Systems  Psychiatric/Behavioral: Positive for depression and substance abuse. Negative for hallucinations, memory loss and suicidal ideas. The patient is nervous/anxious. The patient does not have insomnia.   All other systems reviewed and are negative.   Blood pressure 124/78, pulse 82, temperature (!) 97.4 F (36.3 C), temperature source Oral, resp. rate 18, SpO2 96 %.There is no height or weight on file to calculate BMI.  General Appearance: Fairly Groomed  Eye Contact:  Good  Speech:  Clear and Coherent and Pressured  Volume:  Normal  Mood:  Euthymic  Affect:  Congruent  Thought Process:  Coherent and Descriptions of Associations: Intact  Orientation:  Full (Time, Place, and Person)  Thought Content:  Ruminating  Suicidal Thoughts: Denies  Homicidal Thoughts:  No  Memory:  Immediate;   Good Recent;   Good Remote;   Good  Judgement:  Fair  Insight:  Fair  Psychomotor Activity:  Normal  Concentration:  Concentration: Good and Attention Span: Good  Recall:  Good  Fund of Knowledge:  Fair  Language:  Good  Akathisia:  No  Handed:  Right  AIMS (if indicated):     Assets:  Communication Skills Desire for Improvement Intimacy Social Support  ADL's:  Intact  Cognition:  WNL  Sleep:        Treatment Plan Summary: Patient is not determined to be a danger to self or others. He presents to due to stress from numerous psychosocial stressors to include lack of stable housing and social support. He is very familiar to our ED, as he has presented approximately 5 times in the past 8 days. He is aware and very familiar with the resources that are available to the community at this time and is not requesting any additional resources. He is established at [facility] and is made aware these services are limited at this time due to COvid-19, however telepsych  is still available for med management. He denies suicidal ideation, thoughts, and or suicidal gestures. He denies homicidal ideations and or auditory visual hallucinations at this time. He is requesting discharge. As noted by nursing staff is becoming irritable at this time due to limited food being distributed in the ED.   Disposition: No evidence of imminent risk to self or others at present.   Patient does not meet criteria for psychiatric inpatient admission. Supportive therapy provided about ongoing stressors. Discussed crisis plan, support from social network, calling 911, coming to the Emergency Department, and calling Suicide Hotline. Provide resources from social work to assist with housing.   Maryagnes Amos, FNP 09/22/2018 11:31 AM

## 2018-09-22 NOTE — ED Notes (Signed)
Crayons and coloring book was given to pt.

## 2018-09-22 NOTE — ED Notes (Addendum)
Pt noted to be yelling stating he is ready to leave. Pt upset d/t wants meds for "swollen prostate". [facility] aware and will notify NP [facility].

## 2018-09-22 NOTE — ED Provider Notes (Signed)
Emergency Medicine Observation Re-evaluation Note  [name] is a 58 y.o. male, seen on rounds today.  Pt initially presented to the ED for complaints of Suicidal; Groin Pain; Chest Pain; and Leg Swelling Patient with history of SI, homelessness, medical noncompliance.   Currently, the patient is eating breakfast and is without complaints.  Patiently medically cleared by previous provider yesterday 09/21/2018.  Labs at patient's baseline.  Patient was evaluated by psychiatry yesterday.  Plan for overnight observation for safety and stability with reassessment today.  Extremity swelling appears to be at baseline.  Was seen twice yesterday in ED for similar complaints.  Physical Exam  BP 137/86 (BP Location: Right Arm)   Pulse 86   Temp (!) 97.4 F (36.3 C) (Oral)   Resp 12   SpO2 92%  Physical Exam Vitals signs and nursing note reviewed.  Constitutional:      General: He is not in acute distress.    Appearance: He is well-developed. He is not diaphoretic.  HENT:     Head: Atraumatic.  Eyes:     Pupils: Pupils are equal, round, and reactive to light.  Neck:     Musculoskeletal: Normal range of motion and neck supple.  Cardiovascular:     Rate and Rhythm: Normal rate and regular rhythm.  Pulmonary:     Effort: Pulmonary effort is normal. No respiratory distress.  Abdominal:     General: There is no distension.     Palpations: Abdomen is soft.     Comments: Abdomen soft without any evidence of anasarca.  Musculoskeletal: Normal range of motion.     Comments: 3+ lower extremity pitting edema up to bilateral thighs.  Skin:    General: Skin is warm and dry.  Neurological:     Mental Status: He is alert.    ED Course / MDM  EKG:EKG Interpretation  Date/Time:  Monday September 21 2018 17:37:09 EDT Ventricular Rate:  99 PR Interval:    QRS Duration: 85 QT Interval:  370 QTC Calculation: 475 R Axis:   -24 Text Interpretation:  Sinus rhythm Probable left atrial  enlargement Borderline left axis deviation Anterior infarct, old Nonspecific T abnormalities, lateral leads When compared to prior, no significant changes seen.  No STEMI Confirmed by Theda Belfast ([id number]) on 09/21/2018 5:43:45 PM Also confirmed by Theda Belfast ([id number]), editor Barbette Hair [phone])  on 09/22/2018 6:56:59 AM  Clinical Course as of Sep 21 733  Mon Sep 21, 2018  [id number] 58 year old homeless man with past medical history of CHF presents to the emergency department for the same.  He is a well-known patient to the ED and often comes in to the emergency department for food admission due to his homelessness.  Patient today demands Tylenol, crackers, juice.  He does have significant edema in his legs and scrotum which is not new.  He was just seen this morning.  His labs are baseline.  Chest x-ray clear and lungs sound clear.  He was diuresed with IV Lasix.  He also reports that he is suicidal and wants to stab himself to death.  I will consult with TTS   [KM]    Clinical Course User Index [KM] Arlyn Dunning, PA-C    I have reviewed the labs performed to date as well as medications administered while in observation.  Recent changes in the last 24 hours include none. Plan  Current plan is for reassessment by psychiatry this morning.  He seems to be more concerned about when  he is going to get his next meal.  Patient is currently eating breakfast without complaints.  Vital signs stable throughout the night.  He is taking p.o. medications without complaints. Patient is not under full IVC at this time.   ,  A, PA-C 09/22/18 0735    Eber Hong, MD 09/22/18 [phone]

## 2018-09-22 NOTE — ED Notes (Signed)
Pt is very tearful in room.

## 2018-09-22 NOTE — ED Notes (Signed)
Lunch tray ordered 

## 2018-09-22 NOTE — ED Notes (Addendum)
ALL belongings - 1 labeled belongings bag - returned to pt - Pt verified all items present. Pt refused D/C paperwork. Graham crackers and Sprite given.

## 2018-09-22 NOTE — ED Notes (Signed)
Pt noted to be asking for food, coffee, orange juice - after pt ate breakfast. Coffee given. Pt continues to ask for food. Sitter and RN have advised pt of snack times multiple times. Pt then states "Why does this have to be about food? Why are you talking to me about food? Can't you see I'm homeless?" Pt then requested for RN to assist him w/standing d/t states his scrotum is swollen and he wants to sit in chair. Pt encouraged to perform own ADL's and RN will stand-by. Offered for pt to put on socks at bedside - refused. Pt able to stand from bed himself, ambulate in room, and sit in chair w/o difficulty. Pt c/o "I'm itching all over. What can you do for me?" Lotion given.

## 2018-09-22 NOTE — ED Notes (Signed)
Telepsych completed. Pt standing in doorway of room. Continues to state he is ready to leave and wants to go. Aware waiting for d/c paperwork.

## 2018-09-22 NOTE — ED Notes (Signed)
Pt removes gown to show his genitals. Pt covers back up as requested. Pt also continuously stating "Hey, I'm ready to go". Voiced understanding waiting for NP [facility] to assess him.

## 2018-09-22 NOTE — ED Notes (Signed)
ED Provider at bedside. 

## 2018-09-23 ENCOUNTER — Encounter (HOSPITAL_COMMUNITY): Payer: Self-pay | Admitting: Emergency Medicine

## 2018-09-23 ENCOUNTER — Other Ambulatory Visit: Payer: Self-pay

## 2018-09-23 ENCOUNTER — Emergency Department (HOSPITAL_COMMUNITY)
Admission: EM | Admit: 2018-09-23 | Discharge: 2018-09-23 | Disposition: A | Discharge status: Home / Self Care | Payer: Medicaid Other | Attending: Emergency Medicine | Admitting: Emergency Medicine

## 2018-09-23 DIAGNOSIS — Z794 Long term (current) use of insulin: Secondary | ICD-10-CM | POA: Insufficient documentation

## 2018-09-23 DIAGNOSIS — Z9114 Patient's other noncompliance with medication regimen: Secondary | ICD-10-CM

## 2018-09-23 DIAGNOSIS — R609 Edema, unspecified: Secondary | ICD-10-CM | POA: Diagnosis not present

## 2018-09-23 DIAGNOSIS — Z590 Homelessness: Secondary | ICD-10-CM | POA: Diagnosis not present

## 2018-09-23 DIAGNOSIS — F1290 Cannabis use, unspecified, uncomplicated: Secondary | ICD-10-CM | POA: Insufficient documentation

## 2018-09-23 DIAGNOSIS — F1490 Cocaine use, unspecified, uncomplicated: Secondary | ICD-10-CM | POA: Diagnosis not present

## 2018-09-23 DIAGNOSIS — E119 Type 2 diabetes mellitus without complications: Secondary | ICD-10-CM | POA: Diagnosis not present

## 2018-09-23 DIAGNOSIS — I5042 Chronic combined systolic (congestive) and diastolic (congestive) heart failure: Secondary | ICD-10-CM | POA: Insufficient documentation

## 2018-09-23 DIAGNOSIS — Z79899 Other long term (current) drug therapy: Secondary | ICD-10-CM | POA: Diagnosis not present

## 2018-09-23 DIAGNOSIS — R0789 Other chest pain: Secondary | ICD-10-CM | POA: Insufficient documentation

## 2018-09-23 DIAGNOSIS — I10 Essential (primary) hypertension: Secondary | ICD-10-CM | POA: Insufficient documentation

## 2018-09-23 DIAGNOSIS — F209 Schizophrenia, unspecified: Secondary | ICD-10-CM | POA: Diagnosis not present

## 2018-09-23 DIAGNOSIS — F17210 Nicotine dependence, cigarettes, uncomplicated: Secondary | ICD-10-CM | POA: Insufficient documentation

## 2018-09-23 DIAGNOSIS — N509 Disorder of male genital organs, unspecified: Secondary | ICD-10-CM | POA: Diagnosis present

## 2018-09-23 LAB — CBG MONITORING, ED: Glucose-Capillary: 253 mg/dL — ABNORMAL HIGH (ref 70–99)

## 2018-09-23 MED ORDER — FUROSEMIDE 10 MG/ML IJ SOLN
40.0000 mg | Freq: Once | INTRAMUSCULAR | Status: AC
  Administered 2018-09-23: 40 mg via INTRAMUSCULAR
  Filled 2018-09-23: qty 4

## 2018-09-23 NOTE — ED Notes (Signed)
Per MD, pt given graham crackers, juice, milk.

## 2018-09-23 NOTE — ED Triage Notes (Signed)
BIB EMS from street. C/o CP, SOB, groin & leg swelling. Edema going on a week now. CP began around 2hrs ago, about the time pt reports doing some cocaine. EMS reports SpO2 86% RA, incr'd to 100% on 2Lpm via [place]. Given 324mg  ASA, 1 nitro tab, 4mg  morphine.

## 2018-09-23 NOTE — ED Notes (Signed)
SpO2 noted to be in the mid-80s. Pt sleeping in room and has removed [place].  put back on and turned up to 2Lpm. Pt in NAD.

## 2018-09-23 NOTE — ED Notes (Signed)
Reviewed d/c instructions with pt, who verbalized understanding and had no outstanding questions. Pt departed in NAD, refused use of wheelchair.   

## 2018-09-23 NOTE — Discharge Instructions (Signed)
You need to go to [facility] for your psychiatric care. Go to [facility] wellness for your medical management.  They also have social worker there who can help you.

## 2018-09-23 NOTE — ED Provider Notes (Signed)
[facility] EMERGENCY DEPARTMENT Provider Note   CSN: [id number] Arrival date & time: 09/23/18  0243    History   Chief Complaint Chief Complaint  Patient presents with  . Chest Pain  . Groin Swelling  . Leg Swelling    HPI [name] is a 58 y.o. male.     HPI 58 year old male comes to the ER with chief complaint of chest pain, leg swelling and groin swelling. On my evaluation patient only complains of swelling in his scrotum.  He denies any chest pain related complaints voluntarily.  Upon further questioning he reported that the moment he did not have any chest pain.  His main complaint is the leg swelling.  Patient states that he has not been taking his medication as his cousin holds his medication.  He also states that he is homeless and he needs to be admitted.  Patient has had multiple visits to the ED in the last few days.  Past Medical History:  Diagnosis Date  . Chronic foot pain   . Cocaine abuse (HCC)   . Diabetes mellitus without complication (HCC)   . Hepatitis C    unsure   . Homelessness   . Hypertension   . Neuropathy   . Polysubstance abuse (HCC)   . Schizophrenia (HCC)   . Sleep apnea   . Systolic and diastolic CHF, chronic [facility])     Patient Active Problem List   Diagnosis Date Noted  . Hypokalemia 09/17/2018  . Scrotal swelling 09/17/2018  . Urinary hesitancy   . Constipation   . Acute on chronic combined systolic and diastolic CHF (congestive heart failure) (HCC) 08/25/2018  . Acute respiratory failure with hypoxia (HCC) 08/25/2018  . Acute on chronic combined systolic and diastolic congestive heart failure (HCC) 08/18/2018  . Tobacco use 08/18/2018  . 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 Homelessness 08/08/2018  . Smoker 08/08/2018  . Acute exacerbation of CHF (congestive heart failure) (HCC) 03/28/2018  . Prostate enlargement 03/16/2018   . Aortic atherosclerosis (HCC) 03/16/2018  . 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 Chronic foot pain   . Schizoaffective disorder, bipolar type (HCC) 09/30/2016  . Substance induced mood disorder (HCC) 03/13/2015  . Acute kidney failure (HCC) 01/26/2015  . Schizophrenia, paranoid type (HCC) 01/17/2015  . Suicidal ideation   . Drug hallucinosis (HCC) 10/08/2014  . Chronic paranoid schizophrenia (HCC) 09/07/2014  . 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 Urinary retention   . Cocaine use disorder, severe, dependence (HCC)   . Essential hypertension, benign 03/28/2013  . 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 MULTIPLE TOOTH EXTRACTIONS          Home Medications    Prior to Admission medications   Medication Sig Start Date End Date Taking? Authorizing Provider  ARIPiprazole (ABILIFY) 10 MG tablet Take 1 tablet (10 mg total) by mouth daily. 09/18/18   Meredeth Ide, MD  aspirin EC 81 MG tablet Take 1 tablet (81 mg total) by mouth daily for 30 days. 09/18/18 10/18/18  Meredeth Ide, MD  atorvastatin (LIPITOR) 40 MG tablet Take 1 tablet (40 mg total) by mouth daily. 09/18/18   Meredeth Ide, MD  bisacodyl (DULCOLAX) 5 MG EC tablet Take 1 tablet (5 mg total) by mouth daily as needed for  moderate constipation. 09/04/18   Rodolph Bonghompson, Daniel V, MD  carbamazepine (TEGRETOL) 200 MG tablet Take 0.5 tablets (100 mg total) by mouth 2 (two) times daily. 09/18/18   Meredeth IdeLama, Gagan S, MD  carvedilol (COREG) 6.25 MG tablet Take 1 tablet (6.25 mg total) by mouth 2 (two) times daily with a meal. 09/18/18   Meredeth IdeLama, Gagan S, MD  furosemide (LASIX) 40 MG tablet Take 1 tablet (40 mg total) by mouth daily. 09/18/18   Meredeth IdeLama, Gagan S, MD  Insulin Glargine (LANTUS) 100 UNIT/ML Solostar Pen Inject 14 Units into the skin daily. 09/18/18   Meredeth IdeLama, Gagan S, MD   Insulin Pen Needle 29G X 8MM MISC Use with insulin 09/18/18   Meredeth IdeLama, Gagan S, MD  lisinopril (PRINIVIL,ZESTRIL) 10 MG tablet Take 1 tablet (10 mg total) by mouth daily. 09/18/18   Meredeth IdeLama, Gagan S, MD  nicotine (NICODERM CQ - DOSED IN MG/24 HOURS) 21 mg/24hr patch Place 1 patch (21 mg total) onto the skin daily. 09/04/18   Rodolph Bonghompson, Daniel V, MD  polyethylene glycol [facility](MIRALAX / Ethelene HalGLYCOLAX) packet Take 17 g by mouth 2 (two) times daily. 09/18/18   Meredeth IdeLama, Gagan S, MD  Potassium Chloride ER 20 MEQ TBCR Take 20 mEq by mouth daily. 09/18/18   Meredeth IdeLama, Gagan S, MD  senna-docusate (SENOKOT-S) 8.6-50 MG tablet Take 1 tablet by mouth 2 (two) times daily. 09/04/18   Rodolph Bonghompson, Daniel V, MD  tamsulosin (FLOMAX) 0.4 MG CAPS capsule Take 1 capsule (0.4 mg total) by mouth daily after breakfast. 09/18/18   Meredeth IdeLama, Gagan S, MD    Family History Family History  Problem Relation Age of Onset  . Hypertension Other   . Diabetes Other     Social History Social History   Tobacco Use  . Smoking status: Current Every Day Smoker    Packs/day: 1.00    Years: 20.00    Pack years: 20.00    Types: Cigarettes  . Smokeless tobacco: Current User  Substance Use Topics  . Alcohol use: Yes    Comment: Daily Drinker   . Drug use: Yes    Frequency: 7.0 times per week    Types: "Crack" cocaine, Cocaine, Marijuana     Allergies   Haldol [haloperidol]   Review of Systems Review of Systems  Constitutional: Positive for activity change.  Respiratory: Negative for shortness of breath.   Cardiovascular: Positive for chest pain.  Genitourinary: Positive for scrotal swelling.     Physical Exam Updated Vital Signs BP (!) 150/98   Pulse 92   Temp 98.1 F (36.7 C) (Oral)   Resp 17   Ht 5\' 9"  (1.753 m)   Wt 93 kg   SpO2 97%   BMI 30.28 kg/m   Physical Exam Vitals signs and nursing note reviewed.  Constitutional:      Appearance: He is well-developed.  HENT:     Head: Atraumatic.  Neck:     Musculoskeletal: Neck supple.   Cardiovascular:     Rate and Rhythm: Normal rate.  Pulmonary:     Effort: Pulmonary effort is normal.  Genitourinary:    Comments: Grossly swollen scrotum that is tender to palpation Musculoskeletal:     Right lower leg: Edema present.     Left lower leg: Edema present.  Skin:    General: Skin is warm.  Neurological:     Mental Status: He is alert and oriented to person, place, and time.      ED Treatments / Results  Labs (all labs ordered are  listed, but only abnormal results are displayed) Labs Reviewed  CBG MONITORING, ED - Abnormal; Notable for the following components:      Result Value   Glucose-Capillary 253 (*)    All other components within normal limits    EKG EKG Interpretation  Date/Time:  Wednesday September 23 2018 02:57:05 EDT Ventricular Rate:  91 PR Interval:    QRS Duration: 99 QT Interval:  378 QTC Calculation: 466 R Axis:   99 Text Interpretation:  Sinus rhythm Paired ventricular premature complexes Probable left atrial enlargement Borderline right axis deviation Low voltage, extremity leads Anteroseptal infarct, old Abnormal T, consider ischemia, lateral leads No acute changes Confirmed by Derwood Kaplan [phone]) on 09/23/2018 4:14:55 AM   Radiology Dg Chest 2 View  Result Date: 09/21/2018 CLINICAL DATA:  Chest pain, groin swelling, suicidal ideation EXAM: CHEST - 2 VIEW COMPARISON:  09/20/2018 FINDINGS: Linear scarring in the lingula. Lungs otherwise clear. No pleural effusion or pneumothorax. Cardiomegaly. Mild degenerative changes of the visualized thoracolumbar spine. IMPRESSION: No evidence of acute cardiopulmonary disease. Electronically Signed   By: Charline Bills M.D.   On: 09/21/2018 19:04    Procedures Procedures (including critical care time)  Medications Ordered in ED Medications  furosemide (LASIX) injection 40 mg (40 mg Intramuscular Given 09/23/18 0517)     Initial Impression / Assessment and Plan / ED Course  I have reviewed  the triage vital signs and the nursing notes.  Pertinent labs & imaging results that were available during my care of the patient were reviewed by me and considered in my medical decision making (see chart for details).        58 year old male comes to the ER with chief complaint of swelling in the legs. We will give him IV Lasix.  It appears that the underlying cause for him being here is homelessness.  CBGs normal. I will reach out to his TTS and cousin at 6 AM.  If there is nothing they would be able to add to this visit then we will discharge him.  Final Clinical Impressions(s) / ED Diagnoses   Final diagnoses:  Non compliance w medication regimen  2+ pitting edema    ED Discharge Orders    None       Derwood Kaplan, MD 09/23/18 [phone]

## 2018-09-23 NOTE — ED Notes (Addendum)
Pt denies CP at this time. Chief complaint seems to be focused on scrotal & pedal edema that pt has already been seen for.

## 2018-09-23 NOTE — ED Notes (Signed)
ED Provider at bedside. 

## 2018-09-24 ENCOUNTER — Encounter (HOSPITAL_COMMUNITY): Payer: Self-pay | Admitting: Emergency Medicine

## 2018-09-24 ENCOUNTER — Emergency Department (HOSPITAL_COMMUNITY)
Admission: EM | Admit: 2018-09-24 | Discharge: 2018-09-24 | Disposition: A | Discharge status: Home / Self Care | Payer: Medicaid Other | Attending: Emergency Medicine | Admitting: Emergency Medicine

## 2018-09-24 ENCOUNTER — Other Ambulatory Visit: Payer: Self-pay

## 2018-09-24 ENCOUNTER — Emergency Department (HOSPITAL_COMMUNITY)
Admission: EM | Admit: 2018-09-24 | Discharge: 2018-09-24 | Disposition: A | Discharge status: Home / Self Care | Payer: Medicaid Other | Source: Home / Self Care | Attending: Emergency Medicine | Admitting: Emergency Medicine

## 2018-09-24 DIAGNOSIS — I5042 Chronic combined systolic (congestive) and diastolic (congestive) heart failure: Secondary | ICD-10-CM

## 2018-09-24 DIAGNOSIS — R609 Edema, unspecified: Secondary | ICD-10-CM

## 2018-09-24 DIAGNOSIS — Z79899 Other long term (current) drug therapy: Secondary | ICD-10-CM | POA: Insufficient documentation

## 2018-09-24 DIAGNOSIS — F1490 Cocaine use, unspecified, uncomplicated: Secondary | ICD-10-CM | POA: Insufficient documentation

## 2018-09-24 DIAGNOSIS — F209 Schizophrenia, unspecified: Secondary | ICD-10-CM

## 2018-09-24 DIAGNOSIS — I110 Hypertensive heart disease with heart failure: Secondary | ICD-10-CM | POA: Insufficient documentation

## 2018-09-24 DIAGNOSIS — Z794 Long term (current) use of insulin: Secondary | ICD-10-CM

## 2018-09-24 DIAGNOSIS — R51 Headache: Secondary | ICD-10-CM

## 2018-09-24 DIAGNOSIS — N492 Inflammatory disorders of scrotum: Secondary | ICD-10-CM | POA: Diagnosis present

## 2018-09-24 DIAGNOSIS — I10 Essential (primary) hypertension: Secondary | ICD-10-CM

## 2018-09-24 DIAGNOSIS — F17210 Nicotine dependence, cigarettes, uncomplicated: Secondary | ICD-10-CM

## 2018-09-24 DIAGNOSIS — Z590 Homelessness: Secondary | ICD-10-CM

## 2018-09-24 DIAGNOSIS — E119 Type 2 diabetes mellitus without complications: Secondary | ICD-10-CM | POA: Insufficient documentation

## 2018-09-24 LAB — CBG MONITORING, ED: Glucose-Capillary: 296 mg/dL — ABNORMAL HIGH (ref 70–99)

## 2018-09-24 MED ORDER — FUROSEMIDE 40 MG PO TABS: 40.0000 mg | ORAL_TABLET | Freq: Every day | ORAL | 3 refills | Status: DC

## 2018-09-24 MED ORDER — ACETAMINOPHEN 325 MG PO TABS
650.0000 mg | ORAL_TABLET | Freq: Once | ORAL | Status: AC
  Administered 2018-09-24: 650 mg via ORAL
  Filled 2018-09-24: qty 2

## 2018-09-24 MED ORDER — FUROSEMIDE 10 MG/ML IJ SOLN
40.0000 mg | Freq: Once | INTRAMUSCULAR | Status: AC
  Administered 2018-09-24: 40 mg via INTRAMUSCULAR
  Filled 2018-09-24: qty 4

## 2018-09-24 NOTE — Discharge Instructions (Addendum)
You can be discharged home tonight. Please increase your Lasix dose from 40 mg daily to 40 mg twice daily for the next 4 days, then go back to once daily. Please see your doctor for recheck in the next week.

## 2018-09-24 NOTE — ED Notes (Addendum)
Pt did allow this tech to get his vitals but Pt did not allow anything else to be done until he could take a shower. Pt currently in Pod C taking a shower. Pt's legs do appear swollen but Pt is ambulatory

## 2018-09-24 NOTE — ED Provider Notes (Signed)
[facility] EMERGENCY DEPARTMENT Provider Note   CSN: [id number] Arrival date & time: 09/24/18  9381    History   Chief Complaint Chief Complaint  Patient presents with  . Leg Swelling    HPI [name] is a 58 y.o. male.     Patient to ED with complaint of bilateral LE and scrotal edema. He states he is taking his medication regularly. No SOB or chest pain. The patient is well known to the ED with 41 visits in the past 6 months. He has a documented history of medication noncompliance, substance abuse, homelessness, chronic LE edema and psychiatric issues. Tonight, he complains of swelling and is requesting Tylenol, food and admission for rest. No SI/HI/AVH. No fever, difficulty urinating.   The history is provided by the patient. No language interpreter was used.    Past Medical History:  Diagnosis Date  . Chronic foot pain   . Cocaine abuse (HCC)   . Diabetes mellitus without complication (HCC)   . Hepatitis C    unsure   . Homelessness   . Hypertension   . Neuropathy   . Polysubstance abuse (HCC)   . Schizophrenia (HCC)   . Sleep apnea   . Systolic and diastolic CHF, chronic [facility])     Patient Active Problem List   Diagnosis Date Noted  . Hypokalemia 09/17/2018  . Scrotal swelling 09/17/2018  . Urinary hesitancy   . Constipation   . Acute on chronic combined systolic and diastolic CHF (congestive heart failure) (HCC) 08/25/2018  . Acute respiratory failure with hypoxia (HCC) 08/25/2018  . Acute on chronic combined systolic and diastolic congestive heart failure (HCC) 08/18/2018  . Tobacco use 08/18/2018  . 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 Homelessness 08/08/2018  . Smoker 08/08/2018  . Acute exacerbation of CHF (congestive heart failure) (HCC) 03/28/2018  . Prostate enlargement 03/16/2018  . Aortic atherosclerosis (HCC) 03/16/2018  . Aneurysm of abdominal  aorta (HCC) 03/16/2018  . Chronic foot pain   . Schizoaffective disorder, bipolar type (HCC) 09/30/2016  . Substance induced mood disorder (HCC) 03/13/2015  . Acute kidney failure (HCC) 01/26/2015  . Schizophrenia, paranoid type (HCC) 01/17/2015  . Suicidal ideation   . Drug hallucinosis (HCC) 10/08/2014  . Chronic paranoid schizophrenia (HCC) 09/07/2014  . 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 Urinary retention   . Cocaine use disorder, severe, dependence (HCC)   . Essential hypertension, benign 03/28/2013  . 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 MULTIPLE TOOTH EXTRACTIONS          Home Medications    Prior to Admission medications   Medication Sig Start Date End Date Taking? Authorizing Provider  ARIPiprazole (ABILIFY) 10 MG tablet Take 1 tablet (10 mg total) by mouth daily. 09/18/18   Meredeth Ide, MD  aspirin EC 81 MG tablet Take 1 tablet (81 mg total) by mouth daily for 30 days. 09/18/18 10/18/18  Meredeth Ide, MD  atorvastatin (LIPITOR) 40 MG tablet Take 1 tablet (40 mg total) by mouth daily. 09/18/18   Meredeth Ide, MD  bisacodyl (DULCOLAX) 5 MG EC tablet Take 1 tablet (5 mg total) by mouth daily as needed for moderate constipation. 09/04/18   Rodolph Bong, MD  carbamazepine (TEGRETOL) 200 MG tablet Take 0.5 tablets (100 mg total) by  mouth 2 (two) times daily. 09/18/18   Meredeth Ide, MD  carvedilol (COREG) 6.25 MG tablet Take 1 tablet (6.25 mg total) by mouth 2 (two) times daily with a meal. 09/18/18   Meredeth Ide, MD  furosemide (LASIX) 40 MG tablet Take 1 tablet (40 mg total) by mouth daily. 09/18/18   Meredeth Ide, MD  Insulin Glargine (LANTUS) 100 UNIT/ML Solostar Pen Inject 14 Units into the skin daily. 09/18/18   Meredeth Ide, MD  Insulin Pen Needle 29G X MISC Use with insulin 09/18/18   Meredeth Ide, MD  lisinopril (PRINIVIL,ZESTRIL) 10 MG tablet Take 1 tablet (10 mg total) by mouth daily. 09/18/18   Meredeth Ide, MD  nicotine (NICODERM CQ - DOSED IN MG/24 HOURS) 21 mg/24hr patch Place 1 patch (21 mg total) onto the skin daily. 09/04/18   Rodolph Bong, MD  polyethylene glycol [facility] / Ethelene Hal) packet Take 17 g by mouth 2 (two) times daily. 09/18/18   Meredeth Ide, MD  Potassium Chloride ER 20 MEQ TBCR Take 20 mEq by mouth daily. 09/18/18   Meredeth Ide, MD  senna-docusate (SENOKOT-S) 8.6-50 MG tablet Take 1 tablet by mouth 2 (two) times daily. 09/04/18   Rodolph Bong, MD  tamsulosin (FLOMAX) 0.4 MG CAPS capsule Take 1 capsule (0.4 mg total) by mouth daily after breakfast. 09/18/18   Meredeth Ide, MD    Family History Family History  Problem Relation Age of Onset  . Hypertension Other   . Diabetes Other     Social History Social History   Tobacco Use  . Smoking status: Current Every Day Smoker    Packs/day: 1.00    Years: 20.00    Pack years: 20.00    Types: Cigarettes  . Smokeless tobacco: Current User  Substance Use Topics  . Alcohol use: Yes    Comment: Daily Drinker   . Drug use: Yes    Frequency: 7.0 times per week    Types: "Crack" cocaine, Cocaine, Marijuana     Allergies   Haldol [haloperidol]   Review of Systems Review of Systems  Constitutional: Negative for chills and fever.  HENT: Negative.   Respiratory: Negative.  Negative for shortness of breath.   Cardiovascular: Positive for leg swelling. Negative for chest pain.  Gastrointestinal: Negative.  Negative for abdominal pain and vomiting.  Genitourinary: Positive for scrotal swelling. Negative for difficulty urinating.  Musculoskeletal: Negative.   Skin: Negative.   Neurological: Negative.      Physical Exam Updated Vital Signs BP (!) 159/106 (BP Location: Left Arm)   Pulse (!) 106   Temp 98.8 F (37.1 C) (Oral)   Resp 20   Ht  (1.753 m)   Wt 93 kg   SpO2 96%   BMI  30.28 kg/m   Physical Exam Vitals signs and nursing note reviewed.  Constitutional:      General: He is not in acute distress.    Appearance: He is well-developed.  HENT:     Head: Normocephalic.  Neck:     Musculoskeletal: Normal range of motion and neck supple.  Cardiovascular:     Rate and Rhythm: Normal rate and regular rhythm.     Heart sounds: No murmur.  Pulmonary:     Effort: Pulmonary effort is normal.     Breath sounds: Normal breath sounds.  Abdominal:     General: Bowel sounds are normal.     Palpations: Abdomen is soft.  Tenderness: There is no abdominal tenderness. There is no guarding or rebound.  Genitourinary:    Comments: Scrotal edema present.  Musculoskeletal: Normal range of motion.     Right lower leg: Edema present.     Left lower leg: Edema present.  Skin:    General: Skin is warm and dry.     Findings: No rash.  Neurological:     Mental Status: He is alert and oriented to person, place, and time.      ED Treatments / Results  Labs (all labs ordered are listed, but only abnormal results are displayed) Labs Reviewed - No data to display  EKG None  Radiology No results found.  Procedures Procedures (including critical care time)  Medications Ordered in ED Medications  acetaminophen (TYLENOL) tablet 650 mg (650 mg Oral Given 09/24/18 0542)     Initial Impression / Assessment and Plan / ED Course  I have reviewed the triage vital signs and the nursing notes.  Pertinent labs & imaging results that were available during my care of the patient were reviewed by me and considered in my medical decision making (see chart for details).        Patient well known to the ED presents with LE and scrotal swelling. No SOB, chest pain, fever.   Chart reviewed. The patient has had multiple ED evaluations for dependent and scrotal edema. There is no evidence of any secondary infection. No SOB or chest pain to suggest pulmonary edema. No  hypoxia.   The patient states he needs admission for rest. Discussed that his condition appears stable and that he does not meet criteria for admission at which time he requests a sandwich, graham crackers and Tylenol. He is felt appropriate for discharge. He is encouraged to continue his medications and see his doctor in the outpatient setting given his increased risk for COVID exposure in the ED setting.   Final Clinical Impressions(s) / ED Diagnoses   Final diagnoses:  None   1. Chronic edema  ED Discharge Orders    None       Elpidio Anis, Cordelia Poche 09/24/18 0557    Dione Booze, MD 09/24/18 [phone]

## 2018-09-24 NOTE — ED Notes (Signed)
Pt showered and given all new paper scrubs and socks.

## 2018-09-24 NOTE — ED Triage Notes (Signed)
[place] bus [place] complaining of chronic testicular edema , respirations unlabored .

## 2018-09-24 NOTE — ED Notes (Signed)
Checked CBG 296, RN Bobby informed

## 2018-09-24 NOTE — ED Notes (Signed)
Patient verbalizes understanding of discharge instructions. Opportunity for questioning and answers were provided. Armband removed by staff, pt discharged from ED in wheelchair.  

## 2018-09-24 NOTE — ED Triage Notes (Signed)
BIB EMS from [employer] with c/o of continued leg swelling. Pt told EMS he is just wants a place to stay.

## 2018-09-25 NOTE — ED Provider Notes (Signed)
[facility] EMERGENCY DEPARTMENT Provider Note   CSN: [id number] Arrival date & time: 09/24/18  2255    History   Chief Complaint Chief Complaint  Patient presents with  . Testicle Edema    HPI [name] is a 58 y.o. male.     HPI 58 year old male with history of diabetes, chronic peripheral edema due to medication noncompliance, CHF, polysubstance abuse comes in with chief complaint of swelling. Patient reports that he has had persistent swelling in his testicles and his legs.  He is having discomfort when walking.  Patient states that he has not been compliant with his medications as his cousin has the medications.  He also admits to regular cocaine use.  Patient has been seen in the ER multiple times this week.  He is homeless in the past reported that shelters are closed.  He has no new symptoms.    Past Medical History:  Diagnosis Date  . Chronic foot pain   . Cocaine abuse (HCC)   . Diabetes mellitus without complication (HCC)   . Hepatitis C    unsure   . Homelessness   . Hypertension   . Neuropathy   . Polysubstance abuse (HCC)   . Schizophrenia (HCC)   . Sleep apnea   . Systolic and diastolic CHF, chronic [facility])     Patient Active Problem List   Diagnosis Date Noted  . Hypokalemia 09/17/2018  . Scrotal swelling 09/17/2018  . Urinary hesitancy   . Constipation   . Acute on chronic combined systolic and diastolic CHF (congestive heart failure) (HCC) 08/25/2018  . Acute respiratory failure with hypoxia (HCC) 08/25/2018  . Acute on chronic combined systolic and diastolic congestive heart failure (HCC) 08/18/2018  . Tobacco use 08/18/2018  . 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 Homelessness 08/08/2018  . Smoker 08/08/2018  . Acute exacerbation of CHF (congestive heart failure) (HCC) 03/28/2018  . Prostate enlargement 03/16/2018  . Aortic atherosclerosis  (HCC) 03/16/2018  . 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 Chronic foot pain   . Schizoaffective disorder, bipolar type (HCC) 09/30/2016  . Substance induced mood disorder (HCC) 03/13/2015  . Acute kidney failure (HCC) 01/26/2015  . Schizophrenia, paranoid type (HCC) 01/17/2015  . Suicidal ideation   . Drug hallucinosis (HCC) 10/08/2014  . Chronic paranoid schizophrenia (HCC) 09/07/2014  . 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 Urinary retention   . Cocaine use disorder, severe, dependence (HCC)   . Essential hypertension, benign 03/28/2013  . 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 MULTIPLE TOOTH EXTRACTIONS          Home Medications    Prior to Admission medications   Medication Sig Start Date End Date Taking? Authorizing Provider  ARIPiprazole (ABILIFY) 10 MG tablet Take 1 tablet (10 mg total) by mouth daily. 09/18/18   Meredeth Ide, MD  aspirin EC 81 MG tablet Take 1 tablet (81 mg total) by mouth daily for 30 days. 09/18/18 10/18/18  Meredeth Ide, MD  atorvastatin (LIPITOR) 40 MG tablet Take 1 tablet (40 mg total) by mouth daily. 09/18/18   Meredeth Ide, MD  bisacodyl (DULCOLAX) 5 MG EC tablet Take 1 tablet (5 mg total) by mouth daily as needed for moderate constipation. 09/04/18   Rodolph Bong, MD  carbamazepine (TEGRETOL)  200 MG tablet Take 0.5 tablets (100 mg total) by mouth 2 (two) times daily. 09/18/18   Meredeth IdeLama, Gagan S, MD  carvedilol (COREG) 6.25 MG tablet Take 1 tablet (6.25 mg total) by mouth 2 (two) times daily with a meal. 09/18/18   Meredeth IdeLama, Gagan S, MD  furosemide (LASIX) 40 MG tablet Take 1 tablet (40 mg total) by mouth daily. 09/24/18   Derwood KaplanNanavati, Jessilynn Taft, MD  Insulin Glargine (LANTUS) 100 UNIT/ML Solostar Pen Inject 14 Units into the skin daily. 09/18/18   Meredeth IdeLama, Gagan S, MD  Insulin Pen Needle 29G X  8MM MISC Use with insulin 09/18/18   Meredeth IdeLama, Gagan S, MD  lisinopril (PRINIVIL,ZESTRIL) 10 MG tablet Take 1 tablet (10 mg total) by mouth daily. 09/18/18   Meredeth IdeLama, Gagan S, MD  nicotine (NICODERM CQ - DOSED IN MG/24 HOURS) 21 mg/24hr patch Place 1 patch (21 mg total) onto the skin daily. 09/04/18   Rodolph Bonghompson, Daniel V, MD  polyethylene glycol [facility](MIRALAX / Ethelene HalGLYCOLAX) packet Take 17 g by mouth 2 (two) times daily. 09/18/18   Meredeth IdeLama, Gagan S, MD  Potassium Chloride ER 20 MEQ TBCR Take 20 mEq by mouth daily. 09/18/18   Meredeth IdeLama, Gagan S, MD  senna-docusate (SENOKOT-S) 8.6-50 MG tablet Take 1 tablet by mouth 2 (two) times daily. 09/04/18   Rodolph Bonghompson, Daniel V, MD  tamsulosin (FLOMAX) 0.4 MG CAPS capsule Take 1 capsule (0.4 mg total) by mouth daily after breakfast. 09/18/18   Meredeth IdeLama, Gagan S, MD    Family History Family History  Problem Relation Age of Onset  . Hypertension Other   . Diabetes Other     Social History Social History   Tobacco Use  . Smoking status: Current Every Day Smoker    Packs/day: 1.00    Years: 20.00    Pack years: 20.00    Types: Cigarettes  . Smokeless tobacco: Current User  Substance Use Topics  . Alcohol use: Yes    Comment: Daily Drinker   . Drug use: Yes    Frequency: 7.0 times per week    Types: "Crack" cocaine, Cocaine, Marijuana     Allergies   Haldol [haloperidol]   Review of Systems Review of Systems  Genitourinary: Positive for scrotal swelling.  Skin: Positive for rash.     Physical Exam Updated Vital Signs BP (!) 172/107   Pulse 99   Temp 98.1 F (36.7 C) (Temporal)   Resp (!) 22   SpO2 93%   Physical Exam Vitals signs and nursing note reviewed.  Constitutional:      Appearance: He is well-developed.  HENT:     Head: Atraumatic.  Neck:     Musculoskeletal: Neck supple.  Cardiovascular:     Rate and Rhythm: Normal rate.  Pulmonary:     Effort: Pulmonary effort is normal.  Genitourinary:    Comments: Bilateral scrotal edema with hypertrophy of  the dermis at the base of the scrotum.  There is no active bleeding or blisters. Musculoskeletal:     Right lower leg: Edema present.     Left lower leg: Edema present.  Skin:    General: Skin is warm.  Neurological:     Mental Status: He is alert and oriented to person, place, and time.      ED Treatments / Results  Labs (all labs ordered are listed, but only abnormal results are displayed) Labs Reviewed  CBG MONITORING, ED - Abnormal; Notable for the following components:      Result Value   Glucose-Capillary  296 (*)    All other components within normal limits    EKG EKG Interpretation  Date/Time:  Thursday September 24 2018 23:01:22 EDT Ventricular Rate:  102 PR Interval:    QRS Duration: 92 QT Interval:  322 QTC Calculation: 420 R Axis:   51 Text Interpretation:  Age not entered, assumed to be  58 years old for purpose of ECG interpretation Sinus tachycardia Anteroseptal infarct, old Abnormal T, consider ischemia, lateral leads Nonspecific ST and T wave abnormality No significant change since last tracing Confirmed by Derwood Kaplan [phone]) on 09/25/2018 1:01:36 AM   Radiology No results found.  Procedures Procedures (including critical care time)  Medications Ordered in ED Medications  furosemide (LASIX) injection 40 mg (40 mg Intramuscular Given 09/24/18 2321)     Initial Impression / Assessment and Plan / ED Course  I have reviewed the triage vital signs and the nursing notes.  Pertinent labs & imaging results that were available during my care of the patient were reviewed by me and considered in my medical decision making (see chart for details).       58 year old male comes in a chief complaint of scrotal pain.  He has persistent scrotal edema along with pitting edema that is 2+ in his lower extremities.  Lung exam continues to be clear and patient has no hypoxia, tachypnea.   Patient will be given IM Lasix and I will discharge him with prescription for  oral Lasix.  He states that he has been noncompliant with medications and does use cocaine.   At the moment patient does not need to be admitted for medical reasons.  A lot of his issues are driven by noncompliance, access issue, polysubstance abuse and living situation.   Final Clinical Impressions(s) / ED Diagnoses   Final diagnoses:  Peripheral edema    ED Discharge Orders         Ordered    furosemide (LASIX) 40 MG tablet  Daily     09/24/18 2331           Derwood Kaplan, MD 09/25/18 0109

## 2018-09-29 ENCOUNTER — Emergency Department (HOSPITAL_COMMUNITY)
Admission: EM | Admit: 2018-09-29 | Discharge: 2018-09-29 | Disposition: A | Discharge status: Home / Self Care | Payer: Medicaid Other | Attending: Emergency Medicine | Admitting: Emergency Medicine

## 2018-09-29 ENCOUNTER — Encounter (HOSPITAL_COMMUNITY): Payer: Self-pay | Admitting: Emergency Medicine

## 2018-09-29 ENCOUNTER — Other Ambulatory Visit: Payer: Self-pay

## 2018-09-29 DIAGNOSIS — E876 Hypokalemia: Secondary | ICD-10-CM | POA: Diagnosis not present

## 2018-09-29 DIAGNOSIS — E1140 Type 2 diabetes mellitus with diabetic neuropathy, unspecified: Secondary | ICD-10-CM | POA: Diagnosis not present

## 2018-09-29 DIAGNOSIS — F17210 Nicotine dependence, cigarettes, uncomplicated: Secondary | ICD-10-CM | POA: Diagnosis not present

## 2018-09-29 DIAGNOSIS — Z7982 Long term (current) use of aspirin: Secondary | ICD-10-CM | POA: Insufficient documentation

## 2018-09-29 DIAGNOSIS — Z794 Long term (current) use of insulin: Secondary | ICD-10-CM | POA: Diagnosis not present

## 2018-09-29 DIAGNOSIS — K6289 Other specified diseases of anus and rectum: Secondary | ICD-10-CM | POA: Diagnosis present

## 2018-09-29 DIAGNOSIS — I5042 Chronic combined systolic (congestive) and diastolic (congestive) heart failure: Secondary | ICD-10-CM | POA: Insufficient documentation

## 2018-09-29 DIAGNOSIS — Z79899 Other long term (current) drug therapy: Secondary | ICD-10-CM | POA: Diagnosis not present

## 2018-09-29 DIAGNOSIS — N5089 Other specified disorders of the male genital organs: Secondary | ICD-10-CM

## 2018-09-29 DIAGNOSIS — I110 Hypertensive heart disease with heart failure: Secondary | ICD-10-CM | POA: Insufficient documentation

## 2018-09-29 LAB — BASIC METABOLIC PANEL
Anion gap: 7 (ref 5–15)
BUN: 11 mg/dL (ref 6–20)
CO2: 30 mmol/L (ref 22–32)
Calcium: 9 mg/dL (ref 8.9–10.3)
Chloride: 105 mmol/L (ref 98–111)
Creatinine, Ser: 1.04 mg/dL (ref 0.61–1.24)
GFR calc Af Amer: >60 mL/min (ref >60)
Glucose, Bld: 186 mg/dL — ABNORMAL HIGH (ref 70–99)
Potassium: 3.2 mmol/L — ABNORMAL LOW (ref 3.5–5.1)
Sodium: 142 mmol/L (ref 135–145)

## 2018-09-29 MED ORDER — POTASSIUM CHLORIDE CRYS ER 20 MEQ PO TBCR
40.0000 meq | EXTENDED_RELEASE_TABLET | Freq: Once | ORAL | Status: AC
  Administered 2018-09-29: 40 meq via ORAL
  Filled 2018-09-29: qty 2

## 2018-09-29 MED ORDER — FUROSEMIDE 10 MG/ML IJ SOLN
40.0000 mg | Freq: Once | INTRAMUSCULAR | Status: AC
  Administered 2018-09-29: 40 mg via INTRAVENOUS
  Filled 2018-09-29: qty 4

## 2018-09-29 MED ORDER — ACETAMINOPHEN 325 MG PO TABS
650.0000 mg | ORAL_TABLET | Freq: Once | ORAL | Status: AC
  Administered 2018-09-29: 650 mg via ORAL
  Filled 2018-09-29: qty 2

## 2018-09-29 MED ORDER — HYDROCORTISONE 1 % RE CREA: TOPICAL_CREAM | RECTAL | 0 refills | Status: DC

## 2018-09-29 NOTE — ED Notes (Addendum)
Pt in hallway and states he refuses to return to room.

## 2018-09-29 NOTE — ED Provider Notes (Signed)
[facility] EMERGENCY DEPARTMENT Provider Note   CSN: [id number] Arrival date & time: 09/29/18  0539    History   Chief Complaint Chief Complaint  Patient presents with  . Shortness of Breath    HPI [name] is a 58 y.o. male.     Patient with history of polysubstance abuse, diabetes, hepatitis, homelessness, schizophrenia, heart failure --presents the emergency department with chief complaint of hemorrhoid pain amongst other problems.  Patient states that he is here and would like to get a cream for his hemorrhoids and to have Tylenol.  He also complains of continued groin swelling and would like a cream for this as well.  He states that his mouth hurts.  He does not volunteer sensation of shortness of breath, however when asked does state that he has shortness of breath.  No lower extremity swelling.  Patient states that he last took Lasix about 5 days ago.  He states that he was recently in jail and just got out.  He denies fever or cough.  He denies nausea, vomiting, abdominal pain or diarrhea.  Per EMS, patient smoked crack prior to arrival.  No reported chest pain.  Patient has 42 ED visits in the past 6 months.       Past Medical History:  Diagnosis Date  . Chronic foot pain   . Cocaine abuse (HCC)   . Diabetes mellitus without complication (HCC)   . Hepatitis C    unsure   . Homelessness   . Hypertension   . Neuropathy   . Polysubstance abuse (HCC)   . Schizophrenia (HCC)   . Sleep apnea   . Systolic and diastolic CHF, chronic [facility])     Patient Active Problem List   Diagnosis Date Noted  . Hypokalemia 09/17/2018  . Scrotal swelling 09/17/2018  . Urinary hesitancy   . Constipation   . Acute on chronic combined systolic and diastolic CHF (congestive heart failure) (HCC) 08/25/2018  . Acute respiratory failure with hypoxia (HCC) 08/25/2018  . Acute on chronic combined systolic and diastolic congestive heart failure (HCC) 08/18/2018   . Tobacco use 08/18/2018  . 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 Homelessness 08/08/2018  . Smoker 08/08/2018  . Acute exacerbation of CHF (congestive heart failure) (HCC) 03/28/2018  . Prostate enlargement 03/16/2018  . Aortic atherosclerosis (HCC) 03/16/2018  . 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 Chronic foot pain   . Schizoaffective disorder, bipolar type (HCC) 09/30/2016  . Substance induced mood disorder (HCC) 03/13/2015  . Acute kidney failure (HCC) 01/26/2015  . Schizophrenia, paranoid type (HCC) 01/17/2015  . Suicidal ideation   . Drug hallucinosis (HCC) 10/08/2014  . Chronic paranoid schizophrenia (HCC) 09/07/2014  . 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 Urinary retention   . Cocaine use disorder, severe, dependence (HCC)   . Essential hypertension, benign 03/28/2013  . 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 MULTIPLE TOOTH EXTRACTIONS          Home Medications    Prior to Admission medications   Medication Sig Start Date End Date Taking? Authorizing Provider  ARIPiprazole (ABILIFY) 10 MG tablet Take 1 tablet (10 mg total) by mouth daily. 09/18/18   Meredeth Ide, MD  aspirin EC 81 MG tablet Take 1 tablet (81 mg total) by mouth  daily for 30 days. 09/18/18 10/18/18  Meredeth Ide, MD  atorvastatin (LIPITOR) 40 MG tablet Take 1 tablet (40 mg total) by mouth daily. 09/18/18   Meredeth Ide, MD  bisacodyl (DULCOLAX) 5 MG EC tablet Take 1 tablet (5 mg total) by mouth daily as needed for moderate constipation. 09/04/18   Rodolph Bong, MD  carbamazepine (TEGRETOL) 200 MG tablet Take 0.5 tablets (100 mg total) by mouth 2 (two) times daily. 09/18/18   Meredeth Ide, MD  carvedilol (COREG) 6.25 MG tablet Take 1 tablet (6.25 mg total)  by mouth 2 (two) times daily with a meal. 09/18/18   Meredeth Ide, MD  furosemide (LASIX) 40 MG tablet Take 1 tablet (40 mg total) by mouth daily. 09/24/18   Derwood Kaplan, MD  Insulin Glargine (LANTUS) 100 UNIT/ML Solostar Pen Inject 14 Units into the skin daily. 09/18/18   Meredeth Ide, MD  Insulin Pen Needle 29G X MISC Use with insulin 09/18/18   Meredeth Ide, MD  lisinopril (PRINIVIL,ZESTRIL) 10 MG tablet Take 1 tablet (10 mg total) by mouth daily. 09/18/18   Meredeth Ide, MD  nicotine (NICODERM CQ - DOSED IN MG/24 HOURS) 21 mg/24hr patch Place 1 patch (21 mg total) onto the skin daily. 09/04/18   Rodolph Bong, MD  polyethylene glycol [facility] / Ethelene Hal) packet Take 17 g by mouth 2 (two) times daily. 09/18/18   Meredeth Ide, MD  Potassium Chloride ER 20 MEQ TBCR Take 20 mEq by mouth daily. 09/18/18   Meredeth Ide, MD  senna-docusate (SENOKOT-S) 8.6-50 MG tablet Take 1 tablet by mouth 2 (two) times daily. 09/04/18   Rodolph Bong, MD  tamsulosin (FLOMAX) 0.4 MG CAPS capsule Take 1 capsule (0.4 mg total) by mouth daily after breakfast. 09/18/18   Meredeth Ide, MD    Family History Family History  Problem Relation Age of Onset  . Hypertension Other   . Diabetes Other     Social History Social History   Tobacco Use  . Smoking status: Current Every Day Smoker    Packs/day: 1.00    Years: 20.00    Pack years: 20.00    Types: Cigarettes  . Smokeless tobacco: Current User  Substance Use Topics  . Alcohol use: Yes    Comment: Daily Drinker   . Drug use: Yes    Frequency: 7.0 times per week    Types: "Crack" cocaine, Cocaine, Marijuana     Allergies   Haldol [haloperidol]   Review of Systems Review of Systems  Constitutional: Negative for diaphoresis and fever.  HENT: Positive for dental problem.   Eyes: Negative for redness.  Respiratory: Positive for shortness of breath. Negative for cough and wheezing.   Cardiovascular: Negative for chest pain, palpitations  and leg swelling.  Gastrointestinal: Positive for rectal pain. Negative for abdominal pain, nausea and vomiting.  Genitourinary: Positive for scrotal swelling (chronic). Negative for dysuria.  Musculoskeletal: Negative for back pain and neck pain.  Skin: Negative for rash.  Neurological: Negative for syncope and light-headedness.  Psychiatric/Behavioral: The patient is not nervous/anxious.      Physical Exam Updated Vital Signs BP (!) 169/106 (BP Location: Left Arm)   Pulse (!) 101   Temp 98.1 F (36.7 C) (Oral)   Resp (!) 25   Ht  (1.753 m)   Wt 93 kg   SpO2 94%   BMI 30.28 kg/m   Physical Exam Vitals signs and nursing note reviewed.  Constitutional:      Appearance: He is well-developed. He is not diaphoretic.  HENT:     Head: Normocephalic and atraumatic.     Mouth/Throat:     Mouth: Mucous membranes are not dry.     Comments: Poor dentition.  Mucous membranes moist.  Soft tissues around the mouth and jaw are soft without any palpable abscess. Eyes:     Conjunctiva/sclera: Conjunctivae normal.  Neck:     Musculoskeletal: Normal range of motion and neck supple. No muscular tenderness.     Vascular: Normal carotid pulses. No carotid bruit or JVD.     Trachea: Trachea normal. No tracheal deviation.  Cardiovascular:     Rate and Rhythm: Normal rate and regular rhythm.     Pulses: No decreased pulses.     Heart sounds: Normal heart sounds, S1 normal and S2 normal. Heart sounds not distant. No murmur.  Pulmonary:     Effort: Pulmonary effort is normal. No respiratory distress.     Breath sounds: Normal breath sounds. No wheezing, rhonchi or rales.  Chest:     Chest wall: No tenderness.  Abdominal:     General: Bowel sounds are normal.     Palpations: Abdomen is soft.     Tenderness: There is no abdominal tenderness. There is no guarding or rebound.  Genitourinary:    Penis: Swelling present.      Scrotum/Testes:        Right: Swelling present.        Left:  Swelling present.     Comments: Chronic scrotal swelling and penile edema.  No thrombosed hemorrhoids noted.  No signs of cellulitis or infection of the peritoneum. Skin:    General: Skin is warm and dry.     Coloration: Skin is not pale.  Neurological:     Mental Status: He is alert.      ED Treatments / Results  Labs (all labs ordered are listed, but only abnormal results are displayed) Labs Reviewed  BASIC METABOLIC PANEL    EKG EKG Interpretation  Date/Time:  Tuesday September 29 2018 05:48:51 EDT Ventricular Rate:  104 PR Interval:    QRS Duration: 86 QT Interval:  375 QTC Calculation: 494 R Axis:   25 Text Interpretation:  Sinus tachycardia Atrial premature complexes Probable left atrial enlargement Anterior infarct, old Nonspecific T abnormalities, lateral leads inferior and lateral T wave abnormality have improved Confirmed by Rochele Raring [phone]) on 09/29/2018 6:20:21 AM   Radiology No results found.  Procedures Procedures (including critical care time)  Medications Ordered in ED Medications  acetaminophen (TYLENOL) tablet 650 mg (650 mg Oral Given 09/29/18 7943)     Initial Impression / Assessment and Plan / ED Course  I have reviewed the triage vital signs and the nursing notes.  Pertinent labs & imaging results that were available during my care of the patient were reviewed by me and considered in my medical decision making (see chart for details).        Patient seen and examined.  EKG reviewed by myself.  It appears stable.  Will check electrolytes and give a dose of Lasix.  Patient will be given symptomatic care for his hemorrhoids.  Patient states that he has a prescription of Lasix at the pharmacy that he needs to pick up today.  Vital signs reviewed and are as follows: BP (!) 166/109   Pulse 99   Temp 98.1 F (36.7 C) (Oral)   Resp 16   Ht 5\' 9"  (  1.753 m)   Wt 93 kg   SpO2 94%   BMI 30.28 kg/m   9:08 AM Lasix given with potassium as  previously prescribed.  Patient has been up in the doorway requesting discharge.  Will give hydrocortisone cream for rectal pain.  Patient encouraged to follow-up with his doctor and to fill and take his Lasix.  Final Clinical Impressions(s) / ED Diagnoses   Final diagnoses:  Rectal pain  Scrotal edema  Hypokalemia   Patient well-known to the emergency department with homelessness, heart failure.  Patient has his baseline scrotal edema.  Patient is conversant without any acute shortness of breath.  He does not appear significantly fluid overloaded today, however has been reportedly not taking his Lasix for several days.  Patient was given a dose of Lasix here as well as potassium supplementation.  Normal kidney function.  Patient has been up in the room, requesting discharge.  At this point no acute indications for hospitalization.  ED Discharge Orders         Ordered    hydrocortisone (PROCTO-PAK) 1 % CREA     09/29/18 0902           Renne Crigler, PA-C 09/29/18 0910    Gerhard Munch, MD 09/30/18 [phone]

## 2018-09-29 NOTE — Discharge Instructions (Signed)
Please read and follow all provided instructions.  Your diagnoses today include:  1. Rectal pain   2. Scrotal edema   3. Hypokalemia     Tests performed today include:  Electrolytes - shows normal kidney function, low potassium  Vital signs. See below for your results today.   Medications prescribed:   Steroid cream for hemorrhoids  Take any prescribed medications only as directed.  Home care instructions:  Follow any educational materials contained in this packet.  Follow-up instructions: Please follow-up with your primary care provider in the next 3 days for further evaluation of your symptoms.   Return instructions:   Please return to the Emergency Department if you experience worsening symptoms.   Please return if you have any other emergent concerns.  Additional Information:  Your vital signs today were: BP (!) 160/98    Pulse 97    Temp 98.1 F (36.7 C) (Oral)    Resp (!) 21    Ht 5\' 9"  (1.753 m)    Wt 93 kg    SpO2 97%    BMI 30.28 kg/m  If your blood pressure (BP) was elevated above 135/85 this visit, please have this repeated by your doctor within one month. --------------

## 2018-09-29 NOTE — ED Notes (Signed)
Pt urged to not get out bed. Given all items requested.

## 2018-09-29 NOTE — ED Notes (Signed)
Ambulated pt in hallway, pt's o2  Started at 94% and dropped to 86% on dinamap

## 2018-09-29 NOTE — ED Notes (Signed)
Pt statas he understands instructions. Providers aware of vs.

## 2018-09-29 NOTE — ED Notes (Signed)
Pt wakes when spoken to. Noted as low Spo2 and nasal canula on floor. Canula replaced and sat rising to 94%

## 2018-09-29 NOTE — ED Triage Notes (Signed)
BIB [facility] with c/o of shortness of breath, hemroid pain, and leg swelling. Pt smoked 1g of crack PTA.

## 2018-10-02 ENCOUNTER — Other Ambulatory Visit: Payer: Self-pay

## 2018-10-02 ENCOUNTER — Emergency Department (HOSPITAL_COMMUNITY)
Admission: EM | Admit: 2018-10-02 | Discharge: 2018-10-02 | Disposition: A | Discharge status: Home / Self Care | Payer: Medicare Other | Source: Home / Self Care | Attending: Emergency Medicine | Admitting: Emergency Medicine

## 2018-10-02 ENCOUNTER — Emergency Department (HOSPITAL_COMMUNITY): Payer: Medicare Other

## 2018-10-02 ENCOUNTER — Encounter (HOSPITAL_COMMUNITY): Payer: Self-pay

## 2018-10-02 DIAGNOSIS — I110 Hypertensive heart disease with heart failure: Secondary | ICD-10-CM

## 2018-10-02 DIAGNOSIS — R609 Edema, unspecified: Secondary | ICD-10-CM

## 2018-10-02 DIAGNOSIS — E119 Type 2 diabetes mellitus without complications: Secondary | ICD-10-CM

## 2018-10-02 DIAGNOSIS — M7989 Other specified soft tissue disorders: Secondary | ICD-10-CM | POA: Diagnosis not present

## 2018-10-02 DIAGNOSIS — I5042 Chronic combined systolic (congestive) and diastolic (congestive) heart failure: Secondary | ICD-10-CM

## 2018-10-02 DIAGNOSIS — R197 Diarrhea, unspecified: Secondary | ICD-10-CM

## 2018-10-02 DIAGNOSIS — I10 Essential (primary) hypertension: Secondary | ICD-10-CM

## 2018-10-02 DIAGNOSIS — Z7982 Long term (current) use of aspirin: Secondary | ICD-10-CM

## 2018-10-02 DIAGNOSIS — Z794 Long term (current) use of insulin: Secondary | ICD-10-CM

## 2018-10-02 DIAGNOSIS — F17210 Nicotine dependence, cigarettes, uncomplicated: Secondary | ICD-10-CM | POA: Insufficient documentation

## 2018-10-02 DIAGNOSIS — Z79899 Other long term (current) drug therapy: Secondary | ICD-10-CM

## 2018-10-02 LAB — BASIC METABOLIC PANEL
Anion gap: 9 (ref 5–15)
BUN: 28 mg/dL — ABNORMAL HIGH (ref 6–20)
CO2: 26 mmol/L (ref 22–32)
Calcium: 8.6 mg/dL — ABNORMAL LOW (ref 8.9–10.3)
Chloride: 104 mmol/L (ref 98–111)
Creatinine, Ser: 1.2 mg/dL (ref 0.61–1.24)
GFR calc Af Amer: >60 mL/min (ref >60)
GFR calc non Af Amer: >60 mL/min (ref >60)
Glucose, Bld: 205 mg/dL — ABNORMAL HIGH (ref 70–99)
Potassium: 3.6 mmol/L (ref 3.5–5.1)
Sodium: 139 mmol/L (ref 135–145)

## 2018-10-02 LAB — CBC WITH DIFFERENTIAL/PLATELET
Abs Immature Granulocytes: 0.03 10*3/uL (ref 0.00–0.07)
Basophils Absolute: 0 10*3/uL (ref 0.0–0.1)
Basophils Relative: 1 %
Eosinophils Absolute: 0 10*3/uL (ref 0.0–0.5)
Eosinophils Relative: 0 %
HCT: 35.5 % — ABNORMAL LOW (ref 39.0–52.0)
Hemoglobin: 10.7 g/dL — ABNORMAL LOW (ref 13.0–17.0)
Immature Granulocytes: 0 %
Lymphocytes Relative: 11 %
Lymphs Abs: 0.9 10*3/uL (ref 0.7–4.0)
MCH: 25.5 pg — ABNORMAL LOW (ref 26.0–34.0)
MCHC: 30.1 g/dL (ref 30.0–36.0)
MCV: 84.7 fL (ref 80.0–100.0)
Monocytes Absolute: 0.5 10*3/uL (ref 0.1–1.0)
Monocytes Relative: 7 %
Neutro Abs: 6.6 10*3/uL (ref 1.7–7.7)
Neutrophils Relative %: 81 %
Platelets: 320 10*3/uL (ref 150–400)
RBC: 4.19 MIL/uL — ABNORMAL LOW (ref 4.22–5.81)
RDW: 17.7 % — ABNORMAL HIGH (ref 11.5–15.5)
WBC: 8.1 10*3/uL (ref 4.0–10.5)
nRBC: 0.2 % (ref 0.0–0.2)

## 2018-10-02 LAB — BRAIN NATRIURETIC PEPTIDE: B Natriuretic Peptide: 1344.4 pg/mL — ABNORMAL HIGH (ref 0.0–100.0)

## 2018-10-02 MED ORDER — FUROSEMIDE 10 MG/ML IJ SOLN
40.0000 mg | Freq: Once | INTRAMUSCULAR | Status: AC
  Administered 2018-10-02: 04:00:00 40 mg via INTRAVENOUS
  Filled 2018-10-02: qty 4

## 2018-10-02 MED ORDER — LOPERAMIDE HCL 2 MG PO CAPS: 4.0000 mg | ORAL_CAPSULE | Freq: Once | ORAL | Status: DC

## 2018-10-02 NOTE — ED Notes (Signed)
Security presence required to assist patient with discharge.

## 2018-10-02 NOTE — ED Notes (Signed)
Patient transported to X-ray via stretcher. Patient informed he needs to wear his mask at all times.

## 2018-10-02 NOTE — ED Notes (Signed)
discharge delayed due to pt performing hygiene

## 2018-10-02 NOTE — ED Provider Notes (Signed)
[facility]-EMERGENCY DEPT Provider Note: Taylor Dell, MD, FACEP  CSN: [id number] MRN: [id number] ARRIVAL: 10/02/18 at 0214 ROOM: WA14/WA14   CHIEF COMPLAINT  Swelling   HISTORY OF PRESENT ILLNESS  10/02/18 2:42 AM [name] is a 58 y.o. male with a history of homelessness, polysubstance abuse and chronic scrotal and lower extremity edema.  He is here complaining of "about 4 days" of scrotal and penile edema.  He states this is painful and rates the pain as an 8 out of 10, worse with palpation.  It is noted he has had multiple visits to the ED in the past month for this complaint.  He is also complaining of a cough, chest soreness with cough and diarrhea.  He was incontinent of stool prior to arrival.  EMS reports an oxygen saturation of 83% on room air prior to arrival; this improved to 95% on 6 L by nasal cannula.  They also report a low-grade fever prior to arrival.  On arrival his oxygen saturation is 98% on room air and his temperature is normal.    Past Medical History:  Diagnosis Date  . Chronic foot pain   . Cocaine abuse (HCC)   . Diabetes mellitus without complication (HCC)   . Hepatitis C    unsure   . Homelessness   . Hypertension   . Neuropathy   . Polysubstance abuse (HCC)   . Schizophrenia (HCC)   . Sleep apnea   . Systolic and diastolic CHF, chronic (HCC)     Past Surgical History:  Procedure Laterality Date  . MULTIPLE TOOTH EXTRACTIONS      Family History  Problem Relation Age of Onset  . Hypertension Other   . Diabetes Other     Social History   Tobacco Use  . Smoking status: Current Every Day Smoker    Packs/day: 1.00    Years: 20.00    Pack years: 20.00    Types: Cigarettes  . Smokeless tobacco: Current User  Substance Use Topics  . Alcohol use: Yes    Comment: Daily Drinker   . Drug use: Yes    Frequency: 7.0 times per week    Types: "Crack" cocaine, Cocaine, Marijuana    Prior to Admission medications   Medication Sig Start  Date End Date Taking? Authorizing Provider  ARIPiprazole (ABILIFY) 10 MG tablet Take 1 tablet (10 mg total) by mouth daily. 09/18/18   Meredeth Ide, MD  aspirin EC 81 MG tablet Take 1 tablet (81 mg total) by mouth daily for 30 days. 09/18/18 10/18/18  Meredeth Ide, MD  atorvastatin (LIPITOR) 40 MG tablet Take 1 tablet (40 mg total) by mouth daily. 09/18/18   Meredeth Ide, MD  bisacodyl (DULCOLAX) 5 MG EC tablet Take 1 tablet (5 mg total) by mouth daily as needed for moderate constipation. 09/04/18   Rodolph Bong, MD  carbamazepine (TEGRETOL) 200 MG tablet Take 0.5 tablets (100 mg total) by mouth 2 (two) times daily. 09/18/18   Meredeth Ide, MD  carvedilol (COREG) 6.25 MG tablet Take 1 tablet (6.25 mg total) by mouth 2 (two) times daily with a meal. 09/18/18   Meredeth Ide, MD  furosemide (LASIX) 40 MG tablet Take 1 tablet (40 mg total) by mouth daily. 09/24/18   Derwood Kaplan, MD  hydrocortisone (PROCTO-PAK) 1 % CREA Apply twice a day to hemorrhoids. 09/29/18   Renne Crigler, PA-C  Insulin Glargine (LANTUS) 100 UNIT/ML Solostar Pen Inject 14 Units into the skin  daily. 09/18/18   Meredeth Ide, MD  Insulin Pen Needle 29G X MISC Use with insulin 09/18/18   Meredeth Ide, MD  lisinopril (PRINIVIL,ZESTRIL) 10 MG tablet Take 1 tablet (10 mg total) by mouth daily. 09/18/18   Meredeth Ide, MD  nicotine (NICODERM CQ - DOSED IN MG/24 HOURS) 21 mg/24hr patch Place 1 patch (21 mg total) onto the skin daily. 09/04/18   Rodolph Bong, MD  polyethylene glycol [facility] Ethelene Hal) packet Take 17 g by mouth 2 (two) times daily. 09/18/18   Meredeth Ide, MD  Potassium Chloride ER 20 MEQ TBCR Take 20 mEq by mouth daily. 09/18/18   Meredeth Ide, MD  senna-docusate (SENOKOT-S) 8.6-50 MG tablet Take 1 tablet by mouth 2 (two) times daily. 09/04/18   Rodolph Bong, MD  tamsulosin (FLOMAX) 0.4 MG CAPS capsule Take 1 capsule (0.4 mg total) by mouth daily after breakfast. 09/18/18   Meredeth Ide, MD    Allergies  Haldol [haloperidol]   REVIEW OF SYSTEMS  Negative except as noted here or in the History of Present Illness.   PHYSICAL EXAMINATION  Initial Vital Signs Blood pressure (!) 137/94, pulse (!) 101, resp. rate 20, SpO2 95 %.  Examination General: Well-developed, well-nourished male in no acute distress; appearance consistent with age of record HENT: normocephalic; atraumatic Eyes: pupils equal, round and reactive to light; extraocular muscles intact Neck: supple Heart: regular rate and rhythm Lungs: clear to auscultation bilaterally Abdomen: soft; nondistended; nontender; bowel sounds present GU: Penile and scrotal edema with tenderness but no erythema or warmth Extremities: No deformity; full range of motion; edema of lower extremities Neurologic: Awake, alert and oriented; motor function intact in all extremities and symmetric; no facial droop Skin: Warm and dry Psychiatric: Normal mood and affect   RESULTS  Summary of this visit's results, reviewed by myself:   EKG Interpretation  Date/Time:    Ventricular Rate:    PR Interval:    QRS Duration:   QT Interval:    QTC Calculation:   R Axis:     Text Interpretation:        Laboratory Studies: Results for orders placed or performed during the hospital encounter of 10/02/18 (from the past 24 hour(s))  Brain natriuretic peptide     Status: Abnormal   Collection Time: 10/02/18  2:58 AM  Result Value Ref Range   B Natriuretic Peptide 1,344.4 (H) 0.0 - 100.0 pg/mL  CBC with Differential/Platelet     Status: Abnormal   Collection Time: 10/02/18  2:58 AM  Result Value Ref Range   WBC 8.1 4.0 - 10.5 K/uL   RBC 4.19 (L) 4.22 - 5.81 MIL/uL   Hemoglobin 10.7 (L) 13.0 - 17.0 g/dL   HCT 57.2 (L) 62.0 - 35.5 %   MCV 84.7 80.0 - 100.0 fL   MCH 25.5 (L) 26.0 - 34.0 pg   MCHC 30.1 30.0 - 36.0 g/dL   RDW 97.4 (H) 16.3 - 84.5 %   Platelets 320 150 - 400 K/uL   nRBC 0.2 0.0 - 0.2 %   Neutrophils Relative % 81 %   Neutro Abs 6.6  1.7 - 7.7 K/uL   Lymphocytes Relative 11 %   Lymphs Abs 0.9 0.7 - 4.0 K/uL   Monocytes Relative 7 %   Monocytes Absolute 0.5 0.1 - 1.0 K/uL   Eosinophils Relative 0 %   Eosinophils Absolute 0.0 0.0 - 0.5 K/uL   Basophils Relative 1 %  Basophils Absolute 0.0 0.0 - 0.1 K/uL   Immature Granulocytes 0 %   Abs Immature Granulocytes 0.03 0.00 - 0.07 K/uL  Basic metabolic panel     Status: Abnormal   Collection Time: 10/02/18  2:58 AM  Result Value Ref Range   Sodium 139 135 - 145 mmol/L   Potassium 3.6 3.5 - 5.1 mmol/L   Chloride 104 98 - 111 mmol/L   CO2 26 22 - 32 mmol/L   Glucose, Bld 205 (H) 70 - 99 mg/dL   BUN 28 (H) 6 - 20 mg/dL   Creatinine, Ser 0.15 0.61 - 1.24 mg/dL   Calcium 8.6 (L) 8.9 - 10.3 mg/dL   GFR calc non Af Amer >60 >60 mL/min   GFR calc Af Amer >60 >60 mL/min   Anion gap 9 5 - 15   Imaging Studies: Dg Chest 2 View  Result Date: 10/02/2018 CLINICAL DATA:  Cough, abdominal pain EXAM: CHEST - 2 VIEW COMPARISON:  09/21/2018 FINDINGS: Lingular scarring. No focal consolidation. No pleural effusion or pneumothorax. Cardiomegaly. Degenerative changes of the visualized thoracolumbar spine. IMPRESSION: Normal chest radiographs. Electronically Signed   By: Charline Bills M.D.   On: 10/02/2018 03:42    ED COURSE and MDM  Nursing notes and initial vitals signs, including pulse oximetry, reviewed.  Vitals:   10/02/18 0218 10/02/18 0321 10/02/18 0322  BP: (!) 137/94 (!) 151/90 (!) 151/90  Pulse: (!) 101 95 93  Resp: 20 18 18   Temp:   98.4 F (36.9 C)  TempSrc:   Oral  SpO2: 95% 96% 98%   BRIANT ARIF was evaluated in Emergency Department on 10/02/2018 for the symptoms described in the history of present illness. He was evaluated in the context of the global COVID-19 pandemic, which necessitated consideration that the patient might be at risk for infection with the SARS-CoV-2 virus that causes COVID-19. Institutional protocols and algorithms that pertain to the  evaluation of patients at risk for COVID-19 are in a state of rapid change based on information released by regulatory bodies including the CDC and federal and state organizations. These policies and algorithms were followed during the patient's care in the ED.  The patient's chest x-ray is normal, his oxygen saturation is 98% on room air and he is afebrile.  His BNP is elevated but this is chronic.  He was given a dose of Lasix for his chronic edema.  I do not believe any further work-up or admission are indicated.  PROCEDURES    ED DIAGNOSES     ICD-10-CM   1. Chronic edema R60.9   2. Diarrhea in adult patient R19.7        Paula Libra, MD 10/02/18 [phone]

## 2018-10-02 NOTE — ED Notes (Signed)
Pt refuses temperature. 

## 2018-10-02 NOTE — ED Notes (Addendum)
Pt escorted out of ED

## 2018-10-02 NOTE — ED Notes (Signed)
Patient requesting more wash rags to clean self off with. Patient said, "you can't send me home with shit on my leg." Patient given more wash rags and reminded of DC.

## 2018-10-02 NOTE — ED Triage Notes (Signed)
Pt BIBA c/o testicular pain, genital, as well as new onset cough, abdominal pain, foot pain, new bowel and bladder incontinence, leg swelling.   100.2 83% on room air 95% on 6L 170/90 90hr  20r cbg 261

## 2018-10-02 NOTE — ED Notes (Signed)
Bed: OL07 Expected date:  Expected time:  Means of arrival:  Comments: 58 yr old testicular swelling, cough, fever

## 2018-10-02 NOTE — ED Notes (Signed)
Patient states, "I'm in pain, I need Tylenol. My butthole and penis, that's what's hurting me."

## 2018-10-03 ENCOUNTER — Encounter (HOSPITAL_COMMUNITY): Payer: Self-pay | Admitting: *Deleted

## 2018-10-03 ENCOUNTER — Other Ambulatory Visit: Payer: Self-pay

## 2018-10-03 ENCOUNTER — Inpatient Hospital Stay (HOSPITAL_COMMUNITY)
Admission: EM | Admit: 2018-10-03 | Discharge: 2018-10-05 | DRG: 291 | Disposition: A | Discharge status: Home / Self Care | Payer: Medicare Other | Attending: Family Medicine | Admitting: Family Medicine

## 2018-10-03 ENCOUNTER — Emergency Department (HOSPITAL_COMMUNITY): Payer: Medicare Other

## 2018-10-03 DIAGNOSIS — E876 Hypokalemia: Secondary | ICD-10-CM | POA: Diagnosis present

## 2018-10-03 DIAGNOSIS — E1140 Type 2 diabetes mellitus with diabetic neuropathy, unspecified: Secondary | ICD-10-CM | POA: Diagnosis present

## 2018-10-03 DIAGNOSIS — I110 Hypertensive heart disease with heart failure: Secondary | ICD-10-CM | POA: Diagnosis present

## 2018-10-03 DIAGNOSIS — F1490 Cocaine use, unspecified, uncomplicated: Secondary | ICD-10-CM | POA: Diagnosis present

## 2018-10-03 DIAGNOSIS — F17210 Nicotine dependence, cigarettes, uncomplicated: Secondary | ICD-10-CM | POA: Diagnosis present

## 2018-10-03 DIAGNOSIS — E119 Type 2 diabetes mellitus without complications: Secondary | ICD-10-CM

## 2018-10-03 DIAGNOSIS — R45851 Suicidal ideations: Secondary | ICD-10-CM | POA: Diagnosis present

## 2018-10-03 DIAGNOSIS — I5043 Acute on chronic combined systolic (congestive) and diastolic (congestive) heart failure: Secondary | ICD-10-CM | POA: Diagnosis present

## 2018-10-03 DIAGNOSIS — I700 Atherosclerosis of aorta: Secondary | ICD-10-CM | POA: Diagnosis present

## 2018-10-03 DIAGNOSIS — F4325 Adjustment disorder with mixed disturbance of emotions and conduct: Secondary | ICD-10-CM

## 2018-10-03 DIAGNOSIS — I10 Essential (primary) hypertension: Secondary | ICD-10-CM | POA: Diagnosis not present

## 2018-10-03 DIAGNOSIS — G4733 Obstructive sleep apnea (adult) (pediatric): Secondary | ICD-10-CM | POA: Diagnosis present

## 2018-10-03 DIAGNOSIS — Z7982 Long term (current) use of aspirin: Secondary | ICD-10-CM | POA: Diagnosis not present

## 2018-10-03 DIAGNOSIS — N400 Enlarged prostate without lower urinary tract symptoms: Secondary | ICD-10-CM | POA: Diagnosis present

## 2018-10-03 DIAGNOSIS — Z794 Long term (current) use of insulin: Secondary | ICD-10-CM

## 2018-10-03 DIAGNOSIS — I5040 Unspecified combined systolic (congestive) and diastolic (congestive) heart failure: Secondary | ICD-10-CM

## 2018-10-03 DIAGNOSIS — F319 Bipolar disorder, unspecified: Secondary | ICD-10-CM | POA: Diagnosis present

## 2018-10-03 DIAGNOSIS — Z5900 Homelessness unspecified: Secondary | ICD-10-CM

## 2018-10-03 DIAGNOSIS — Z888 Allergy status to other drugs, medicaments and biological substances status: Secondary | ICD-10-CM | POA: Diagnosis not present

## 2018-10-03 DIAGNOSIS — F200 Paranoid schizophrenia: Secondary | ICD-10-CM | POA: Diagnosis present

## 2018-10-03 DIAGNOSIS — J9601 Acute respiratory failure with hypoxia: Secondary | ICD-10-CM | POA: Diagnosis present

## 2018-10-03 DIAGNOSIS — Z79899 Other long term (current) drug therapy: Secondary | ICD-10-CM | POA: Diagnosis not present

## 2018-10-03 DIAGNOSIS — G4730 Sleep apnea, unspecified: Secondary | ICD-10-CM

## 2018-10-03 DIAGNOSIS — M7989 Other specified soft tissue disorders: Secondary | ICD-10-CM | POA: Diagnosis present

## 2018-10-03 LAB — CBC WITH DIFFERENTIAL/PLATELET
Abs Immature Granulocytes: 0.03 K/uL (ref 0.00–0.07)
Basophils Absolute: 0 K/uL (ref 0.0–0.1)
Basophils Relative: 0 %
Eosinophils Absolute: 0 K/uL (ref 0.0–0.5)
Eosinophils Relative: 0 %
HCT: 38.9 % — ABNORMAL LOW (ref 39.0–52.0)
Hemoglobin: 11.5 g/dL — ABNORMAL LOW (ref 13.0–17.0)
Immature Granulocytes: 0 %
Lymphocytes Relative: 11 %
Lymphs Abs: 0.8 K/uL (ref 0.7–4.0)
MCH: 25.1 pg — ABNORMAL LOW (ref 26.0–34.0)
MCHC: 29.6 g/dL — ABNORMAL LOW (ref 30.0–36.0)
MCV: 84.7 fL (ref 80.0–100.0)
Monocytes Absolute: 0.5 K/uL (ref 0.1–1.0)
Monocytes Relative: 7 %
Neutro Abs: 6 K/uL (ref 1.7–7.7)
Neutrophils Relative %: 82 %
Platelets: 331 K/uL (ref 150–400)
RBC: 4.59 MIL/uL (ref 4.22–5.81)
RDW: 17.5 % — ABNORMAL HIGH (ref 11.5–15.5)
WBC: 7.3 K/uL (ref 4.0–10.5)
nRBC: 0 % (ref 0.0–0.2)

## 2018-10-03 LAB — URINALYSIS, ROUTINE W REFLEX MICROSCOPIC
Bacteria, UA: NONE SEEN
Bilirubin Urine: NEGATIVE
Glucose, UA: NEGATIVE mg/dL
Ketones, ur: NEGATIVE mg/dL
Leukocytes,Ua: NEGATIVE
Nitrite: NEGATIVE
Protein, ur: NEGATIVE mg/dL
Specific Gravity, Urine: 1.004 — ABNORMAL LOW (ref 1.005–1.030)
pH: 8 (ref 5.0–8.0)

## 2018-10-03 LAB — COMPREHENSIVE METABOLIC PANEL WITH GFR
ALT: 20 U/L (ref 0–44)
AST: 36 U/L (ref 15–41)
Albumin: 3.2 g/dL — ABNORMAL LOW (ref 3.5–5.0)
Alkaline Phosphatase: 115 U/L (ref 38–126)
Anion gap: 11 (ref 5–15)
BUN: 25 mg/dL — ABNORMAL HIGH (ref 6–20)
CO2: 30 mmol/L (ref 22–32)
Calcium: 8.3 mg/dL — ABNORMAL LOW (ref 8.9–10.3)
Chloride: 101 mmol/L (ref 98–111)
Creatinine, Ser: 1.04 mg/dL (ref 0.61–1.24)
GFR calc Af Amer: >60 mL/min (ref >60)
GFR calc non Af Amer: >60 mL/min (ref >60)
Glucose, Bld: 193 mg/dL — ABNORMAL HIGH (ref 70–99)
Potassium: 2.6 mmol/L — CL (ref 3.5–5.1)
Sodium: 142 mmol/L (ref 135–145)
Total Bilirubin: 0.9 mg/dL (ref 0.3–1.2)
Total Protein: 7.9 g/dL (ref 6.5–8.1)

## 2018-10-03 LAB — RAPID URINE DRUG SCREEN, HOSP PERFORMED
Amphetamines: NOT DETECTED
Barbiturates: NOT DETECTED
Benzodiazepines: NOT DETECTED
Cocaine: POSITIVE — AB
Opiates: NOT DETECTED
Tetrahydrocannabinol: NOT DETECTED

## 2018-10-03 LAB — MAGNESIUM: Magnesium: 1.8 mg/dL (ref 1.7–2.4)

## 2018-10-03 LAB — SALICYLATE LEVEL: Salicylate Lvl: <7 mg/dL (ref 2.8–30.0)

## 2018-10-03 LAB — ACETAMINOPHEN LEVEL: Acetaminophen (Tylenol), Serum: <10 ug/mL — ABNORMAL LOW (ref 10–30)

## 2018-10-03 LAB — BRAIN NATRIURETIC PEPTIDE: B Natriuretic Peptide: 1117.6 pg/mL — ABNORMAL HIGH (ref 0.0–100.0)

## 2018-10-03 LAB — ETHANOL: Alcohol, Ethyl (B): <10 mg/dL (ref <10)

## 2018-10-03 MED ORDER — ACETAMINOPHEN 325 MG PO TABS
650.0000 mg | ORAL_TABLET | Freq: Four times a day (QID) | ORAL | Status: DC | PRN
  Administered 2018-10-03: 22:00:00 650 mg via ORAL
  Filled 2018-10-03: qty 2

## 2018-10-03 MED ORDER — SODIUM CHLORIDE 0.9% FLUSH
3.0000 mL | Freq: Two times a day (BID) | INTRAVENOUS | Status: DC
  Administered 2018-10-03 – 2018-10-05 (×3): 3 mL via INTRAVENOUS

## 2018-10-03 MED ORDER — VITAMIN B-1 100 MG PO TABS
100.0000 mg | ORAL_TABLET | Freq: Every day | ORAL | Status: DC
  Administered 2018-10-03 – 2018-10-04 (×2): 100 mg via ORAL
  Filled 2018-10-03 (×3): qty 1

## 2018-10-03 MED ORDER — ADULT MULTIVITAMIN W/MINERALS CH
1.0000 | ORAL_TABLET | Freq: Every day | ORAL | Status: DC
  Administered 2018-10-03 – 2018-10-05 (×3): 1 via ORAL
  Filled 2018-10-03 (×3): qty 1

## 2018-10-03 MED ORDER — ACETAMINOPHEN 650 MG RE SUPP: 650.0000 mg | Freq: Four times a day (QID) | RECTAL | Status: DC | PRN

## 2018-10-03 MED ORDER — NICOTINE 21 MG/24HR TD PT24
21.0000 mg | MEDICATED_PATCH | Freq: Every day | TRANSDERMAL | Status: DC
  Administered 2018-10-03 – 2018-10-05 (×3): 21 mg via TRANSDERMAL
  Filled 2018-10-03 (×3): qty 1

## 2018-10-03 MED ORDER — ATORVASTATIN CALCIUM 40 MG PO TABS
40.0000 mg | ORAL_TABLET | Freq: Every day | ORAL | Status: DC
  Administered 2018-10-04: 40 mg via ORAL
  Filled 2018-10-03: qty 1

## 2018-10-03 MED ORDER — LORAZEPAM 1 MG PO TABS: 1.0000 mg | ORAL_TABLET | Freq: Four times a day (QID) | ORAL | Status: DC | PRN

## 2018-10-03 MED ORDER — POTASSIUM CHLORIDE 10 MEQ/100ML IV SOLN
10.0000 meq | INTRAVENOUS | Status: AC
  Administered 2018-10-03 (×2): 10 meq via INTRAVENOUS
  Filled 2018-10-03: qty 100

## 2018-10-03 MED ORDER — INSULIN GLARGINE 100 UNIT/ML ~~LOC~~ SOLN
14.0000 [IU] | Freq: Every day | SUBCUTANEOUS | Status: DC
  Administered 2018-10-03 – 2018-10-04 (×2): 14 [IU] via SUBCUTANEOUS
  Filled 2018-10-03 (×3): qty 0.14

## 2018-10-03 MED ORDER — ENOXAPARIN SODIUM 40 MG/0.4ML ~~LOC~~ SOLN
40.0000 mg | Freq: Every day | SUBCUTANEOUS | Status: DC
  Administered 2018-10-03 – 2018-10-04 (×2): 40 mg via SUBCUTANEOUS
  Filled 2018-10-03 (×2): qty 0.4

## 2018-10-03 MED ORDER — FUROSEMIDE 10 MG/ML IJ SOLN
40.0000 mg | Freq: Two times a day (BID) | INTRAMUSCULAR | Status: DC
  Administered 2018-10-04 – 2018-10-05 (×3): 40 mg via INTRAVENOUS
  Filled 2018-10-03 (×3): qty 4

## 2018-10-03 MED ORDER — POTASSIUM CHLORIDE CRYS ER 20 MEQ PO TBCR
40.0000 meq | EXTENDED_RELEASE_TABLET | Freq: Once | ORAL | Status: AC
  Administered 2018-10-03: 20:00:00 40 meq via ORAL
  Filled 2018-10-03: qty 2

## 2018-10-03 MED ORDER — LISINOPRIL 10 MG PO TABS
10.0000 mg | ORAL_TABLET | Freq: Every day | ORAL | Status: DC
  Administered 2018-10-03 – 2018-10-05 (×3): 10 mg via ORAL
  Filled 2018-10-03 (×3): qty 1

## 2018-10-03 MED ORDER — ARIPIPRAZOLE 10 MG PO TABS
10.0000 mg | ORAL_TABLET | Freq: Every day | ORAL | Status: DC
  Administered 2018-10-03 – 2018-10-05 (×3): 10 mg via ORAL
  Filled 2018-10-03 (×3): qty 1

## 2018-10-03 MED ORDER — FUROSEMIDE 10 MG/ML IJ SOLN
40.0000 mg | Freq: Once | INTRAMUSCULAR | Status: AC
  Administered 2018-10-03: 18:00:00 40 mg via INTRAVENOUS
  Filled 2018-10-03: qty 4

## 2018-10-03 MED ORDER — POTASSIUM CHLORIDE CRYS ER 20 MEQ PO TBCR
40.0000 meq | EXTENDED_RELEASE_TABLET | Freq: Two times a day (BID) | ORAL | Status: DC
  Administered 2018-10-04 – 2018-10-05 (×3): 40 meq via ORAL
  Filled 2018-10-03 (×3): qty 2

## 2018-10-03 MED ORDER — POTASSIUM CHLORIDE 10 MEQ/100ML IV SOLN
10.0000 meq | INTRAVENOUS | Status: AC
  Administered 2018-10-03: 10 meq via INTRAVENOUS
  Filled 2018-10-03 (×2): qty 100

## 2018-10-03 MED ORDER — CARBAMAZEPINE 200 MG PO TABS
100.0000 mg | ORAL_TABLET | Freq: Two times a day (BID) | ORAL | Status: DC
  Administered 2018-10-03 – 2018-10-05 (×4): 100 mg via ORAL
  Filled 2018-10-03 (×4): qty 0.5

## 2018-10-03 MED ORDER — LORAZEPAM 2 MG/ML IJ SOLN
1.0000 mg | Freq: Four times a day (QID) | INTRAMUSCULAR | Status: DC | PRN
  Administered 2018-10-03 – 2018-10-05 (×5): 1 mg via INTRAVENOUS
  Filled 2018-10-03 (×5): qty 1

## 2018-10-03 MED ORDER — TAMSULOSIN HCL 0.4 MG PO CAPS
0.4000 mg | ORAL_CAPSULE | Freq: Every day | ORAL | Status: DC
  Administered 2018-10-04 – 2018-10-05 (×2): 0.4 mg via ORAL
  Filled 2018-10-03 (×2): qty 1

## 2018-10-03 MED ORDER — THIAMINE HCL 100 MG/ML IJ SOLN
100.0000 mg | Freq: Every day | INTRAMUSCULAR | Status: DC
  Administered 2018-10-05: 100 mg via INTRAVENOUS
  Filled 2018-10-03: qty 2

## 2018-10-03 MED ORDER — INSULIN ASPART 100 UNIT/ML ~~LOC~~ SOLN
0.0000 [IU] | Freq: Three times a day (TID) | SUBCUTANEOUS | Status: DC
  Administered 2018-10-04 (×2): 3 [IU] via SUBCUTANEOUS
  Administered 2018-10-05: 2 [IU] via SUBCUTANEOUS
  Administered 2018-10-05: 3 [IU] via SUBCUTANEOUS

## 2018-10-03 MED ORDER — FOLIC ACID 1 MG PO TABS
1.0000 mg | ORAL_TABLET | Freq: Every day | ORAL | Status: DC
  Administered 2018-10-03 – 2018-10-05 (×3): 1 mg via ORAL
  Filled 2018-10-03 (×3): qty 1

## 2018-10-03 MED ORDER — ASPIRIN EC 81 MG PO TBEC
81.0000 mg | DELAYED_RELEASE_TABLET | Freq: Every day | ORAL | Status: DC
  Administered 2018-10-03 – 2018-10-05 (×3): 81 mg via ORAL
  Filled 2018-10-03 (×3): qty 1

## 2018-10-03 NOTE — B[facility] Counselor (Signed)
Pt is unable to be assessed due to being moved to medical floor. Pt to be removed from shift report.    Redmond Pulling, MS, [facility], [facility] Triage Specialist [phone]

## 2018-10-03 NOTE — ED Notes (Signed)
Pt urinated all over the room floor when he was brought into the room.  Place an external catheter on pt where he has a disfigured penis and sore is also present on penis.

## 2018-10-03 NOTE — H&P (Signed)
History and Physical    [name] GMW:[id number] DOB: 06/03/1961 DOA: 10/03/2018  PCP: Patient, No Pcp Per  Patient coming from: [place]  I have personally briefly reviewed patient's old medical records in [facility]  Chief Complaint: Lower extremity swelling  HPI: [name] is a 58 y.o. male with medical history significant for chronic combined systolic and diastolic CHF, insulin-dependent diabetes, hypertension, chronic paranoid schizophrenia, substance use disorder, homelessness, and OSA not on CPAP with frequent ED visits returns to the ED with bilateral leg swelling, scrotal edema, shortness of breath, chronic nonproductive cough, and suicidal ideation.  He was seen in the ED yesterday, 10/02/2018, and treated with IV Lasix and was discharged.  He returned to the ED today for 10/03/2018, for persistent edema and no place to go.  He also reported intention to harm himself if he returned to the streets again, without plans.  He denies any chest pain, palpitations, dysuria, fevers, chills, diaphoresis.   He admits to crack cocaine use with last use morning of admission.  Reports smoking up to 3 packs cigarettes per day when he is able to obtain them.  He reports drinking about 1 gallon of alcohol per week with last drink about 48 hours prior to admission.  ED Course:  Initial vitals showed BP 151/94, pulse 95, RR 18, temp 98.0 Fahrenheit, SPO2 94% on 2 L supplemental O2 via .  He reportedly desaturated to mid 80s on room air which improved with supplemental O2.  Labs are notable for potassium 2.6 (3.6 on 10/02/2018), serum glucose 193, BUN 25, creatinine 1.04, WBC 7.3, hemoglobin 11.5, platelets 331, BNP 1117.6, urinalysis negative for UTI, UDS positive for cocaine.  Serum ethanol, salicylate level, and acetaminophen level were unremarkable.  Portable chest x-ray showed enlarged cardiac silhouette phonic scarring in the left midlung, no focal consolidation or infiltrate.   Behavioral health were consulted for patient's suicidal ideation, evaluation pending.  Patient was given IV K 10 mEq x 2 as well as oral 40 mEq once.  He was also given IV Lasix 40 mg once.  List service was consulted to admit for further management of CHF and hypokalemia.  Review of Systems: As per HPI otherwise 10 point review of systems negative.    Past Medical History:  Diagnosis Date  . Chronic foot pain   . Cocaine abuse (HCC)   . Diabetes mellitus without complication (HCC)   . Hepatitis C    unsure   . Homelessness   . Hypertension   . Neuropathy   . Polysubstance abuse (HCC)   . Schizophrenia (HCC)   . Sleep apnea   . Systolic and diastolic CHF, chronic (HCC)     Past Surgical History:  Procedure Laterality Date  . MULTIPLE TOOTH EXTRACTIONS       reports that he has been smoking cigarettes. He has a 20.00 pack-year smoking history. He uses smokeless tobacco. He reports current alcohol use. He reports current drug use. Frequency: 7.00 times per week. Drugs: "Crack" cocaine, Cocaine, and Marijuana.  Allergies  Allergen Reactions  . Haldol [Haloperidol] Other (See Comments)    Muscle spasms, loss of voluntary movement. However, pt has taken Thorazine on multiple occasions with no adverse effects.     Family History  Problem Relation Age of Onset  . Hypertension Other   . Diabetes Other      Prior to Admission medications   Medication Sig Start Date End Date Taking? Authorizing Provider  ARIPiprazole (ABILIFY) 10 MG  tablet Take 1 tablet (10 mg total) by mouth daily. 09/18/18   Meredeth Ide, MD  aspirin EC 81 MG tablet Take 1 tablet (81 mg total) by mouth daily for 30 days. 09/18/18 10/18/18  Meredeth Ide, MD  atorvastatin (LIPITOR) 40 MG tablet Take 1 tablet (40 mg total) by mouth daily. 09/18/18   Meredeth Ide, MD  bisacodyl (DULCOLAX) 5 MG EC tablet Take 1 tablet (5 mg total) by mouth daily as needed for moderate constipation. 09/04/18   Rodolph Bong, MD   carbamazepine (TEGRETOL) 200 MG tablet Take 0.5 tablets (100 mg total) by mouth 2 (two) times daily. 09/18/18   Meredeth Ide, MD  carvedilol (COREG) 6.25 MG tablet Take 1 tablet (6.25 mg total) by mouth 2 (two) times daily with a meal. 09/18/18   Meredeth Ide, MD  furosemide (LASIX) 40 MG tablet Take 1 tablet (40 mg total) by mouth daily. 09/24/18   Derwood Kaplan, MD  hydrocortisone (PROCTO-PAK) 1 % CREA Apply twice a day to hemorrhoids. 09/29/18   Renne Crigler, PA-C  Insulin Glargine (LANTUS) 100 UNIT/ML Solostar Pen Inject 14 Units into the skin daily. 09/18/18   Meredeth Ide, MD  Insulin Pen Needle 29G X MISC Use with insulin 09/18/18   Meredeth Ide, MD  lisinopril (PRINIVIL,ZESTRIL) 10 MG tablet Take 1 tablet (10 mg total) by mouth daily. 09/18/18   Meredeth Ide, MD  nicotine (NICODERM CQ - DOSED IN MG/24 HOURS) 21 mg/24hr patch Place 1 patch (21 mg total) onto the skin daily. 09/04/18   Rodolph Bong, MD  polyethylene glycol [facility] / Ethelene Hal) packet Take 17 g by mouth 2 (two) times daily. 09/18/18   Meredeth Ide, MD  Potassium Chloride ER 20 MEQ TBCR Take 20 mEq by mouth daily. 09/18/18   Meredeth Ide, MD  senna-docusate (SENOKOT-S) 8.6-50 MG tablet Take 1 tablet by mouth 2 (two) times daily. 09/04/18   Rodolph Bong, MD  tamsulosin [facility]) 0.4 MG CAPS capsule Take 1 capsule (0.4 mg total) by mouth daily after breakfast. 09/18/18   Meredeth Ide, MD    Physical Exam: Vitals:   10/03/18 1715 10/03/18 1939 10/03/18 1950 10/03/18 2030  BP: (!) 155/94  (!) 142/89 (!) 143/92  Pulse: 93  99 98  Resp: 18  18 (!) 22  Temp:      TempSrc:      SpO2: 95% (!) 86% 93% 94%    Constitutional: Disheveled man resting supine in bed, NAD, calm, comfortable Eyes: PERRL, lids and conjunctivae normal ENMT: Mucous membranes are moist. Posterior pharynx clear of any exudate or lesions.  Neck: normal, supple, no masses. Respiratory: Bibasilar inspiratory crackles, no wheezing. Normal  respiratory effort. No accessory muscle use.  Cardiovascular: Regular rate and rhythm, no murmurs / rubs / gallops.  +2 pitting edema bilateral lower extremities. Abdomen: Soft, distended abdomen, no tenderness, no masses palpated. No hepatosplenomegaly. Bowel sounds positive. GU: Scrotal edema, condom cath in place Musculoskeletal: no clubbing / cyanosis. No joint deformity upper and lower extremities. Good ROM, no contractures. Normal muscle tone.  Skin: no rashes. No induration Neurologic: CN 2-12 grossly intact. Sensation intact, Strength 5/5 in all 4.  Psychiatric: Alert and oriented x 3.  Reports suicidal ideation.  Denies auditory or visual hallucinations.    Labs on Admission: I have personally reviewed following labs and imaging studies  CBC: Recent Labs  Lab 10/02/18 0258 10/03/18 1650  WBC 8.1 7.3  NEUTROABS  6.6 6.0  HGB 10.7* 11.5*  HCT 35.5* 38.9*  MCV 84.7 84.7  PLT 320 331   Basic Metabolic Panel: Recent Labs  Lab Oct 06, 2018 0623 10/02/18 0258 10/03/18 1650  NA 142 139 142  K 3.2* 3.6 2.6*  CL 105 104 101  CO2 30 26 30   GLUCOSE 186* 205* 193*  BUN 11 28* 25*  CREATININE 1.04 1.20 1.04  CALCIUM 9.0 8.6* 8.3*   GFR: Estimated Creatinine Clearance: 88.2 mL/min (by C-G formula based on SCr of 1.04 mg/dL). Liver Function Tests: Recent Labs  Lab 10/03/18 1650  AST 36  ALT 20  ALKPHOS 115  BILITOT 0.9  PROT 7.9  ALBUMIN 3.2*   No results for input(s): LIPASE, AMYLASE in the last 168 hours. No results for input(s): AMMONIA in the last 168 hours. Coagulation Profile: No results for input(s): INR, PROTIME in the last 168 hours. Cardiac Enzymes: No results for input(s): CKTOTAL, CKMB, CKMBINDEX, TROPONINI in the last 168 hours. BNP (last 3 results) No results for input(s): PROBNP in the last 8760 hours. HbA1C: No results for input(s): HGBA1C in the last 72 hours. CBG: No results for input(s): GLUCAP in the last 168 hours. Lipid Profile: No results  for input(s): CHOL, HDL, LDLCALC, TRIG, CHOLHDL, LDLDIRECT in the last 72 hours. Thyroid Function Tests: No results for input(s): TSH, T4TOTAL, FREET4, T3FREE, THYROIDAB in the last 72 hours. Anemia Panel: No results for input(s): VITAMINB12, FOLATE, FERRITIN, TIBC, IRON, RETICCTPCT in the last 72 hours. Urine analysis:    Component Value Date/Time   COLORURINE COLORLESS (A) 10/03/2018 1651   APPEARANCEUR CLEAR 10/03/2018 1651   LABSPEC 1.004 (L) 10/03/2018 1651   PHURINE 8.0 10/03/2018 1651   GLUCOSEU NEGATIVE 10/03/2018 1651   HGBUR SMALL (A) 10/03/2018 1651   BILIRUBINUR NEGATIVE 10/03/2018 1651   KETONESUR NEGATIVE 10/03/2018 1651   PROTEINUR NEGATIVE 10/03/2018 1651   UROBILINOGEN 1.0 03/14/2015 0610   NITRITE NEGATIVE 10/03/2018 1651   LEUKOCYTESUR NEGATIVE 10/03/2018 1651    Radiological Exams on Admission: Dg Chest 2 View  Result Date: 10/02/2018 CLINICAL DATA:  Cough, abdominal pain EXAM: CHEST - 2 VIEW COMPARISON:  09/21/2018 FINDINGS: Lingular scarring. No focal consolidation. No pleural effusion or pneumothorax. Cardiomegaly. Degenerative changes of the visualized thoracolumbar spine. IMPRESSION: Normal chest radiographs. Electronically Signed   By: Charline Bills M.D.   On: 10/02/2018 03:42   Dg Chest Port 1 View  Result Date: 10/03/2018 CLINICAL DATA:  Swelling of the lower half of the body. Shortness of breath and cough. EXAM: PORTABLE CHEST 1 VIEW COMPARISON:  10/02/2018.  Multiple prior chest radiographs. FINDINGS: Chronic cardiomegaly with left ventricular prominence. Chronic aortic atherosclerosis. Chronic pulmonary scarring in the left mid lung. No evidence of heart failure or effusion. No active pulmonary process. IMPRESSION: No active disease. Chronic cardiomegaly, aortic atherosclerosis and left mid lung scarring. Electronically Signed   By: Paulina Fusi M.D.   On: 10/03/2018 19:37    EKG: Independently reviewed. Sinus tachycardia, PACs, TWI V5-V6 more  pronounced compared to EKG 10-06-2018 but similar to 09/24/2018.  Assessment/Plan Principal Problem:   Acute on chronic combined systolic (congestive) and diastolic (congestive) heart failure (HCC) Active Problems:   Insulin-requiring or dependent type II diabetes mellitus (HCC)   Essential hypertension, benign   Chronic paranoid schizophrenia (HCC)   Homelessness   Hypokalemia   Sleep apnea   [name] is a 58 y.o. male with medical history significant for chronic combined systolic and diastolic CHF, insulin-dependent diabetes, hypertension, chronic paranoid  schizophrenia, substance use disorder, homelessness, and OSA not on CPAP with frequent ED visits who is admitted for acute on chronic CHF, hypokalemia, and suicidal ideation.  Acute on chronic combined systolic and diastolic CHF (EF-25-30%): -Continue IV Lasix 40 mg twice daily -Supplement potassium and monitor electrolytes/renal function -Continue lisinopril -Hold carvedilol with ongoing cocaine use -Strict I/O's and daily weights  Hypokalemia: K 2.6 likely from receiving IV Lasix 1 day prior to admission.  Received supplementation in ED. -Give additional potassium supplement with ongoing diuresis -Check magnesium, replete as needed  Chronic paranoid schizophrenia/Bipolar disorder with suicidal ideation: Patient reports intention to harm self without plan.  He denies any auditory or visual hallucinations.  Does not appear to be taking his mood stabilizing medications. -Restart Abilify and Tegretol -Suicide precautions -Awaiting behavioral health evaluation  Insulin-dependent diabetes: A1c 7.3% on 07/18/2018. -Continue Lantus 14 units nightly plus sensitive SSI -CBG checks TIDAC, hypoglycemia protocol  Hypertension: Currently stable. -Continue lisinopril and Lasix -Holding Coreg as above  Homelessness: Per EDP conversation with patient's cousin, patient has a place to stay but seems rather live on the street.  -Case management and social work consults placed  Polyubstance use disorder: Has ongoing crack cocaine use.  He reports varying amounts of alcohol use with last drink about 48 hours prior to admission.  He denies any prior history of withdrawal. -CIWA protocol  Tobacco use: Smoking up to 3 PPD. -Nicotine patch  OSA: Patient with history of OSA but not using CPAP due to social situation.  Suspect untreated OSA is contributing to his hypoxia on presentation. -CPAP nightly while admitted   DVT prophylaxis: Lovenox Code Status: Full code per prior Family Communication: None present on admission Disposition Plan: Pending behavioral health evaluation and social work/case management assistance Consults called: [facility] consulted in ED Admission status: Inpatient, patient likely requires greater than 2 midnight length stay for further management of acute on chronic combined systolic diastolic CHF, mod-severe hypokalemia, and behavioral health evaluation.   Darreld Mclean MD [facility] Pager [phone]  If 7PM-7AM, please contact night-coverage [email or web address]  10/03/2018, 9:03 PM

## 2018-10-03 NOTE — ED Notes (Signed)
Date and time results received: 10/03/18 1841 (use smartphrase ".now" to insert current time)  Test: K+ Critical Value: 2.6  Name of Provider Notified: Dr. Chaney Malling  Orders Received? Or Actions Taken?: reported K+ 2.6 to Dr Chaney Malling.

## 2018-10-03 NOTE — ED Triage Notes (Signed)
Pt complains of swelling to feet, scrotum, and abdomen. Pt seen for same yesterday. Pt also complains of SOB and cough. Pt reports doing cocaine 2 hours prior to arrival.   BP 154/86 HR 90 RR 18 CBG 249 Temp 97.9

## 2018-10-03 NOTE — ED Notes (Signed)
This writer placed a small blue lighter and empty furosemide bottle placed in belonging bag.

## 2018-10-03 NOTE — ED Notes (Signed)
Pt given orange juice, milk, graham crackers, peanut butter, and [place] sandwich.

## 2018-10-03 NOTE — ED Notes (Signed)
ED TO INPATIENT HANDOFF REPORT  ED Nurse Name and Phone #: Sharene Skeans [phone]  S Name/Age/Gender [name] 58 y.o. male Room/Bed: WA24/WA24  Code Status   Code Status: Full Code  Home/SNF/Other homeless Patient oriented to: self, place, time and situation Is this baseline? No   Triage Complete: Triage complete  Chief Complaint Edema  Triage Note Pt complains of swelling to feet, scrotum, and abdomen. Pt seen for same yesterday. Pt also complains of SOB and cough. Pt reports doing cocaine 2 hours prior to arrival.   BP 154/86 HR 90 RR 18 CBG 249 Temp 97.9   Allergies Allergies  Allergen Reactions  . Haldol [Haloperidol] Other (See Comments)    Muscle spasms, loss of voluntary movement. However, pt has taken Thorazine on multiple occasions with no adverse effects.     Level of Care/Admitting Diagnosis ED Disposition    ED Disposition Condition Comment   Admit  Hospital Area: [facility] [[id number]]  Level of Care: Telemetry [5]  Admit to tele based on following criteria: Acute CHF  Diagnosis: Acute on chronic combined systolic (congestive) and diastolic (congestive) heart failure [facility]) [[id number]]  Admitting Physician: Charlsie Quest [[id number]]  Attending Physician: Charlsie Quest [[id number]]  Estimated length of stay: past midnight tomorrow  Certification:: I certify this patient will need inpatient services for at least 2 midnights  PT Class (Do Not Modify): Inpatient [101]  PT Acc Code (Do Not Modify): Private [1]       B Medical/Surgery History Past Medical History:  Diagnosis Date  . Chronic foot pain   . Cocaine abuse (HCC)   . Diabetes mellitus without complication (HCC)   . Hepatitis C    unsure   . Homelessness   . Hypertension   . Neuropathy   . Polysubstance abuse (HCC)   . Schizophrenia (HCC)   . Sleep apnea   . Systolic and diastolic CHF, chronic (HCC)    Past Surgical History:  Procedure Laterality Date  .  MULTIPLE TOOTH EXTRACTIONS       A IV Location/Drains/Wounds Patient Lines/Drains/Airways Status   Active Line/Drains/Airways    Name:   Placement date:   Placement time:   Site:   Days:   Peripheral IV 10/02/18 Right Antecubital   10/02/18    0320    Antecubital   1   External Urinary Catheter   10/03/18    1626    -   less than 1          Intake/Output Last 24 hours  Intake/Output Summary (Last 24 hours) at 10/03/2018 2040 Last data filed at 10/03/2018 1928 Gross per 24 hour  Intake -  Output 2500 ml  Net -2500 ml    Labs/Imaging Results for orders placed or performed during the hospital encounter of 10/03/18 (from the past 48 hour(s))  CBC with Differential/Platelet     Status: Abnormal   Collection Time: 10/03/18  4:50 PM  Result Value Ref Range   WBC 7.3 4.0 - 10.5 K/uL   RBC 4.59 4.22 - 5.81 MIL/uL   Hemoglobin 11.5 (L) 13.0 - 17.0 g/dL   HCT 10.6 (L) 26.9 - 48.5 %   MCV 84.7 80.0 - 100.0 fL   MCH 25.1 (L) 26.0 - 34.0 pg   MCHC 29.6 (L) 30.0 - 36.0 g/dL   RDW 46.2 (H) 70.3 - 50.0 %   Platelets 331 150 - 400 K/uL   nRBC 0.0 0.0 - 0.2 %   Neutrophils  Relative % 82 %   Neutro Abs 6.0 1.7 - 7.7 K/uL   Lymphocytes Relative 11 %   Lymphs Abs 0.8 0.7 - 4.0 K/uL   Monocytes Relative 7 %   Monocytes Absolute 0.5 0.1 - 1.0 K/uL   Eosinophils Relative 0 %   Eosinophils Absolute 0.0 0.0 - 0.5 K/uL   Basophils Relative 0 %   Basophils Absolute 0.0 0.0 - 0.1 K/uL   Immature Granulocytes 0 %   Abs Immature Granulocytes 0.03 0.00 - 0.07 K/uL    Comment: Performed at [facility], [address], [place], [place] [address]  Comprehensive metabolic panel     Status: Abnormal   Collection Time: 10/03/18  4:50 PM  Result Value Ref Range   Sodium 142 135 - 145 mmol/L   Potassium 2.6 (LL) 3.5 - 5.1 mmol/L    Comment: CRITICAL RESULT CALLED TO, READ BACK BY AND VERIFIED WITH: B.JESSEE,RN [id number]  BY V.WILKINS DELTA CHECK NOTED    Chloride 101 98 - 111  mmol/L   CO2 30 22 - 32 mmol/L   Glucose, Bld 193 (H) 70 - 99 mg/dL   BUN 25 (H) 6 - 20 mg/dL   Creatinine, Ser 7.84 0.61 - 1.24 mg/dL   Calcium 8.3 (L) 8.9 - 10.3 mg/dL   Total Protein 7.9 6.5 - 8.1 g/dL   Albumin 3.2 (L) 3.5 - 5.0 g/dL   AST 36 15 - 41 U/L   ALT 20 0 - 44 U/L   Alkaline Phosphatase 115 38 - 126 U/L   Total Bilirubin 0.9 0.3 - 1.2 mg/dL   GFR calc non Af Amer >60 >60 mL/min   GFR calc Af Amer >60 >60 mL/min   Anion gap 11 5 - 15    Comment: Performed at [facility], [address] [place], [place] [address]  Ethanol     Status: None   Collection Time: 10/03/18  4:50 PM  Result Value Ref Range   Alcohol, Ethyl (B) <10 <10 mg/dL    Comment: (NOTE) Lowest detectable limit for serum alcohol is 10 mg/dL. For medical purposes only. Performed at [facility], [address], [place], [place] [address]   Salicylate level     Status: None   Collection Time: 10/03/18  4:50 PM  Result Value Ref Range   Salicylate Lvl <7.0 2.8 - 30.0 mg/dL    Comment: Performed at [facility], [address] [place], [place] [address]  Acetaminophen level     Status: Abnormal   Collection Time: 10/03/18  4:50 PM  Result Value Ref Range   Acetaminophen (Tylenol), Serum <10 (L) 10 - 30 ug/mL    Comment: (NOTE) Therapeutic concentrations vary significantly. A range of 10-30 ug/mL  may be an effective concentration for many patients. However, some  are best treated at concentrations outside of this range. Acetaminophen concentrations >150 ug/mL at 4 hours after ingestion  and >50 ug/mL at 12 hours after ingestion are often associated with  toxic reactions. Performed at [facility], [address], [place], [place] [address]   Rapid urine drug screen (hospital performed)     Status: Abnormal   Collection Time: 10/03/18  4:50 PM  Result Value Ref Range   Opiates NONE DETECTED NONE DETECTED   Cocaine  POSITIVE (A) NONE DETECTED   Benzodiazepines NONE DETECTED NONE DETECTED   Amphetamines NONE DETECTED NONE DETECTED   Tetrahydrocannabinol NONE DETECTED NONE DETECTED   Barbiturates  NONE DETECTED NONE DETECTED    Comment: (NOTE) DRUG SCREEN FOR MEDICAL PURPOSES ONLY.  IF CONFIRMATION IS NEEDED FOR ANY PURPOSE, NOTIFY LAB WITHIN 5 DAYS. LOWEST DETECTABLE LIMITS FOR URINE DRUG SCREEN Drug Class                     Cutoff (ng/mL) Amphetamine and metabolites    1000 Barbiturate and metabolites    200 Benzodiazepine                 200 Tricyclics and metabolites     300 Opiates and metabolites        300 Cocaine and metabolites        300 THC                            50 Performed at [facility], [address] [place], [place] [address]   Urinalysis, Routine w reflex microscopic     Status: Abnormal   Collection Time: 10/03/18  4:51 PM  Result Value Ref Range   Color, Urine COLORLESS (A) YELLOW   APPearance CLEAR CLEAR   Specific Gravity, Urine 1.004 (L) 1.005 - 1.030   pH 8.0 5.0 - 8.0   Glucose, UA NEGATIVE NEGATIVE mg/dL   Hgb urine dipstick SMALL (A) NEGATIVE   Bilirubin Urine NEGATIVE NEGATIVE   Ketones, ur NEGATIVE NEGATIVE mg/dL   Protein, ur NEGATIVE NEGATIVE mg/dL   Nitrite NEGATIVE NEGATIVE   Leukocytes,Ua NEGATIVE NEGATIVE   RBC / HPF 0-5 0 - 5 RBC/hpf   WBC, UA 0-5 0 - 5 WBC/hpf   Bacteria, UA NONE SEEN NONE SEEN    Comment: Performed at [facility], [address] [place], [place] [address]  Brain natriuretic peptide     Status: Abnormal   Collection Time: 10/03/18  4:51 PM  Result Value Ref Range   B Natriuretic Peptide 1,117.6 (H) 0.0 - 100.0 pg/mL    Comment: Performed at [facility], [address] [place], [place] [address]   Dg Chest 2 View  Result Date: 10/02/2018 CLINICAL DATA:  Cough, abdominal pain EXAM: CHEST - 2 VIEW COMPARISON:  09/21/2018 FINDINGS: Lingular scarring. No focal  consolidation. No pleural effusion or pneumothorax. Cardiomegaly. Degenerative changes of the visualized thoracolumbar spine. IMPRESSION: Normal chest radiographs. Electronically Signed   By: Charline Bills M.D.   On: 10/02/2018 03:42   Dg Chest Port 1 View  Result Date: 10/03/2018 CLINICAL DATA:  Swelling of the lower half of the body. Shortness of breath and cough. EXAM: PORTABLE CHEST 1 VIEW COMPARISON:  10/02/2018.  Multiple prior chest radiographs. FINDINGS: Chronic cardiomegaly with left ventricular prominence. Chronic aortic atherosclerosis. Chronic pulmonary scarring in the left mid lung. No evidence of heart failure or effusion. No active pulmonary process. IMPRESSION: No active disease. Chronic cardiomegaly, aortic atherosclerosis and left mid lung scarring. Electronically Signed   By: Paulina Fusi M.D.   On: 10/03/2018 19:37    Pending Labs Unresulted Labs (From admission, onward)    Start     Ordered   10/04/18 0500  CBC  Tomorrow morning,   R     10/03/18 2036   10/04/18 0500  Basic metabolic panel  Tomorrow morning,   R     10/03/18 2036   10/04/18 0500  Magnesium  Tomorrow morning,   R     10/03/18 2036   10/03/18 2034  Magnesium  Add-on,  R     10/03/18 2033          Vitals/Pain Today's Vitals   10/03/18 1715 10/03/18 1939 10/03/18 1950 10/03/18 2030  BP: (!) 155/94  (!) 142/89 (!) 143/92  Pulse: 93  99 98  Resp: 18  18 (!) 22  Temp:      TempSrc:      SpO2: 95% (!) 86% 93% 94%  PainSc:        Isolation Precautions No active isolations  Medications Medications  potassium chloride 10 mEq in 100 mL IVPB (10 mEq Intravenous New Bag/Given 10/03/18 2016)  enoxaparin (LOVENOX) injection 40 mg (has no administration in time range)  sodium chloride flush (NS) 0.9 % injection 3 mL (has no administration in time range)  acetaminophen (TYLENOL) tablet 650 mg (has no administration in time range)    Or  acetaminophen (TYLENOL) suppository 650 mg (has no  administration in time range)  LORazepam (ATIVAN) tablet 1 mg (has no administration in time range)    Or  LORazepam (ATIVAN) injection 1 mg (has no administration in time range)  thiamine (VITAMIN B-1) tablet 100 mg (has no administration in time range)    Or  thiamine (B-1) injection 100 mg (has no administration in time range)  folic acid (FOLVITE) tablet 1 mg (has no administration in time range)  multivitamin with minerals tablet 1 tablet (has no administration in time range)  ARIPiprazole (ABILIFY) tablet 10 mg (has no administration in time range)  aspirin EC tablet 81 mg (has no administration in time range)  atorvastatin (LIPITOR) tablet 40 mg (has no administration in time range)  carbamazepine (TEGRETOL) tablet 100 mg (has no administration in time range)  lisinopril (PRINIVIL,ZESTRIL) tablet 10 mg (has no administration in time range)  nicotine (NICODERM CQ - dosed in mg/24 hours) patch 21 mg (has no administration in time range)  tamsulosin (FLOMAX) capsule 0.4 mg (has no administration in time range)  insulin glargine (LANTUS) injection 14 Units (has no administration in time range)  insulin aspart (novoLOG) injection 0-9 Units (has no administration in time range)  potassium chloride 10 mEq in 100 mL IVPB (has no administration in time range)  potassium chloride SA (K-DUR,KLOR-CON) CR tablet 40 mEq (has no administration in time range)  furosemide (LASIX) injection 40 mg (40 mg Intravenous Given 10/03/18 1730)  potassium chloride SA (K-DUR,KLOR-CON) CR tablet 40 mEq (40 mEq Oral Given 10/03/18 2015)    Mobility walks Low fall risk   Focused Assessments Pulmonary Assessment Handoff:  Lung sounds:   O2 Device: Nasal Cannula O2 Flow Rate (L/min): 2 L/min      R Recommendations: See Admitting Provider Note  Report given to: Dorene SorrowJerry RN  Additional Notes:

## 2018-10-03 NOTE — ED Provider Notes (Signed)
[facility]-EMERGENCY DEPT Provider Note   CSN: [id number] Arrival date & time: 10/03/18  1602    History   Chief Complaint Chief Complaint  Patient presents with  . Groin Swelling  . Leg Swelling  . Suicidal    HPI [name] is a 58 y.o. male history of diabetes, hypertension, polysubstance abuse, schizophrenia, systolic and diastolic heart failure here presenting with bilateral leg swelling, scrotal swelling, suicidal ideation.  Patient came to the ED frequently for similar symptoms.  Has been seen in the ED more than 10 times last month and was admitted for CHF exacerbation about 2 weeks ago.  Patient was in the ED yesterday and his BNP increased to 1300 and he was given a dose of IV Lasix and was sent home.  Patient told me that he had nowhere to go so he came back to the ER since he has persistent swelling of his penis and scrotum.  Patient denies any shortness of breath currently.  Patient also states that he has nowhere to go today and his cousin, [name] is unable to take him until tomorrow.  He states that he has some suicidal ideations and wants to harm himself if he goes on the street again.  He does not have any particular plans however.      The history is provided by the patient.    Past Medical History:  Diagnosis Date  . Chronic foot pain   . Cocaine abuse (HCC)   . Diabetes mellitus without complication (HCC)   . Hepatitis C    unsure   . Homelessness   . Hypertension   . Neuropathy   . Polysubstance abuse (HCC)   . Schizophrenia (HCC)   . Sleep apnea   . Systolic and diastolic CHF, chronic [facility])     Patient Active Problem List   Diagnosis Date Noted  . Hypokalemia 09/17/2018  . Scrotal swelling 09/17/2018  . Urinary hesitancy   . Constipation   . Acute on chronic combined systolic and diastolic CHF (congestive heart failure) (HCC) 08/25/2018  . Acute respiratory failure with hypoxia (HCC) 08/25/2018  . Acute on chronic  combined systolic and diastolic congestive heart failure (HCC) 08/18/2018  . Tobacco use 08/18/2018  . 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 Homelessness 08/08/2018  . Smoker 08/08/2018  . Acute exacerbation of CHF (congestive heart failure) (HCC) 03/28/2018  . Prostate enlargement 03/16/2018  . Aortic atherosclerosis (HCC) 03/16/2018  . 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 Chronic foot pain   . Schizoaffective disorder, bipolar type (HCC) 09/30/2016  . Substance induced mood disorder (HCC) 03/13/2015  . Acute kidney failure (HCC) 01/26/2015  . Schizophrenia, paranoid type (HCC) 01/17/2015  . Suicidal ideation   . Drug hallucinosis (HCC) 10/08/2014  . Chronic paranoid schizophrenia (HCC) 09/07/2014  . 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 Urinary retention   . Cocaine use disorder, severe, dependence (HCC)   . Essential hypertension, benign 03/28/2013  . 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 MULTIPLE TOOTH EXTRACTIONS          Home Medications    Prior to Admission medications   Medication Sig Start Date End Date Taking? Authorizing Provider  ARIPiprazole (ABILIFY) 10 MG tablet Take 1 tablet (10 mg total) by mouth daily. 09/18/18   Sharl Ma,  Sarina Ill, MD  aspirin EC 81 MG tablet Take 1 tablet (81 mg total) by mouth daily for 30 days. 09/18/18 10/18/18  Meredeth Ide, MD  atorvastatin (LIPITOR) 40 MG tablet Take 1 tablet (40 mg total) by mouth daily. 09/18/18   Meredeth Ide, MD  bisacodyl (DULCOLAX) 5 MG EC tablet Take 1 tablet (5 mg total) by mouth daily as needed for moderate constipation. 09/04/18   Rodolph Bong, MD  carbamazepine (TEGRETOL) 200 MG tablet Take 0.5 tablets (100 mg total) by mouth 2 (two) times daily. 09/18/18   Meredeth Ide, MD  carvedilol (COREG) 6.25 MG tablet Take 1 tablet (6.25 mg total) by mouth 2 (two) times daily with a meal. 09/18/18   Meredeth Ide, MD  furosemide (LASIX) 40 MG tablet Take 1 tablet (40 mg total) by mouth daily. 09/24/18   Derwood Kaplan, MD  hydrocortisone (PROCTO-PAK) 1 % CREA Apply twice a day to hemorrhoids. 09/29/18   Renne Crigler, PA-C  Insulin Glargine (LANTUS) 100 UNIT/ML Solostar Pen Inject 14 Units into the skin daily. 09/18/18   Meredeth Ide, MD  Insulin Pen Needle 29G X MISC Use with insulin 09/18/18   Meredeth Ide, MD  lisinopril (PRINIVIL,ZESTRIL) 10 MG tablet Take 1 tablet (10 mg total) by mouth daily. 09/18/18   Meredeth Ide, MD  nicotine (NICODERM CQ - DOSED IN MG/24 HOURS) 21 mg/24hr patch Place 1 patch (21 mg total) onto the skin daily. 09/04/18   Rodolph Bong, MD  polyethylene glycol [facility] Ethelene Hal) packet Take 17 g by mouth 2 (two) times daily. 09/18/18   Meredeth Ide, MD  Potassium Chloride ER 20 MEQ TBCR Take 20 mEq by mouth daily. 09/18/18   Meredeth Ide, MD  senna-docusate (SENOKOT-S) 8.6-50 MG tablet Take 1 tablet by mouth 2 (two) times daily. 09/04/18   Rodolph Bong, MD  tamsulosin (FLOMAX) 0.4 MG CAPS capsule Take 1 capsule (0.4 mg total) by mouth daily after breakfast. 09/18/18   Meredeth Ide, MD    Family History Family History  Problem Relation Age of Onset  . Hypertension Other   . Diabetes Other     Social History Social History   Tobacco Use  . Smoking status: Current Every Day Smoker    Packs/day: 1.00    Years: 20.00    Pack years: 20.00    Types: Cigarettes  . Smokeless tobacco: Current User  Substance Use Topics  . Alcohol use: Yes    Comment: Daily Drinker   . Drug use: Yes    Frequency: 7.0 times per week    Types: "Crack" cocaine, Cocaine, Marijuana     Allergies   Haldol [haloperidol]   Review of Systems Review of Systems  Cardiovascular: Positive for leg swelling.  Genitourinary: Positive for  penile swelling and scrotal swelling.  All other systems reviewed and are negative.    Physical Exam Updated Vital Signs BP (!) 142/89 (BP Location: Left Arm)   Pulse 99   Temp 98 F (36.7 C) (Oral)   Resp 18   SpO2 93%   Physical Exam Vitals signs and nursing note reviewed.  Constitutional:      Appearance: Normal appearance.     Comments: Chronically ill but not acutely ill   HENT:     Head: Normocephalic.     Nose: Nose normal.     Mouth/Throat:     Mouth: Mucous membranes are moist.  Eyes:  Extraocular Movements: Extraocular movements intact.     Pupils: Pupils are equal, round, and reactive to light.  Neck:     Musculoskeletal: Normal range of motion.  Cardiovascular:     Rate and Rhythm: Normal rate.  Pulmonary:     Effort: Pulmonary effort is normal.  Abdominal:     General: Abdomen is flat.     Palpations: Abdomen is soft.  Genitourinary:    Comments: Ulcer on the bottom of the penis with no obvious active infection. Some bilateral scrotal swelling that was present yesterday, no testicular tenderness  Musculoskeletal:     Comments: 2+ edema bilateral legs   Skin:    General: Skin is warm.     Capillary Refill: Capillary refill takes less than 2 seconds.  Neurological:     General: No focal deficit present.     Mental Status: He is alert and oriented to person, place, and time.  Psychiatric:        Mood and Affect: Mood normal.        Behavior: Behavior normal.      ED Treatments / Results  Labs (all labs ordered are listed, but only abnormal results are displayed) Labs Reviewed  CBC WITH DIFFERENTIAL/PLATELET - Abnormal; Notable for the following components:      Result Value   Hemoglobin 11.5 (*)    HCT 38.9 (*)    MCH 25.1 (*)    MCHC 29.6 (*)    RDW 17.5 (*)    All other components within normal limits  COMPREHENSIVE METABOLIC PANEL - Abnormal; Notable for the following components:   Potassium 2.6 (*)    Glucose, Bld 193 (*)    BUN 25  (*)    Calcium 8.3 (*)    Albumin 3.2 (*)    All other components within normal limits  ACETAMINOPHEN LEVEL - Abnormal; Notable for the following components:   Acetaminophen (Tylenol), Serum <10 (*)    All other components within normal limits  RAPID URINE DRUG SCREEN, HOSP PERFORMED - Abnormal; Notable for the following components:   Cocaine POSITIVE (*)    All other components within normal limits  URINALYSIS, ROUTINE W REFLEX MICROSCOPIC - Abnormal; Notable for the following components:   Color, Urine COLORLESS (*)    Specific Gravity, Urine 1.004 (*)    Hgb urine dipstick SMALL (*)    All other components within normal limits  BRAIN NATRIURETIC PEPTIDE - Abnormal; Notable for the following components:   B Natriuretic Peptide 1,117.6 (*)    All other components within normal limits  ETHANOL  SALICYLATE LEVEL    EKG None  Radiology Dg Chest 2 View  Result Date: 10/02/2018 CLINICAL DATA:  Cough, abdominal pain EXAM: CHEST - 2 VIEW COMPARISON:  09/21/2018 FINDINGS: Lingular scarring. No focal consolidation. No pleural effusion or pneumothorax. Cardiomegaly. Degenerative changes of the visualized thoracolumbar spine. IMPRESSION: Normal chest radiographs. Electronically Signed   By: Charline Bills M.D.   On: 10/02/2018 03:42   Dg Chest Port 1 View  Result Date: 10/03/2018 CLINICAL DATA:  Swelling of the lower half of the body. Shortness of breath and cough. EXAM: PORTABLE CHEST 1 VIEW COMPARISON:  10/02/2018.  Multiple prior chest radiographs. FINDINGS: Chronic cardiomegaly with left ventricular prominence. Chronic aortic atherosclerosis. Chronic pulmonary scarring in the left mid lung. No evidence of heart failure or effusion. No active pulmonary process. IMPRESSION: No active disease. Chronic cardiomegaly, aortic atherosclerosis and left mid lung scarring. Electronically Signed   By: Paulina Fusi  M.D.   On: 10/03/2018 19:37    Procedures Procedures (including critical care time)   CRITICAL CARE Performed by: Richardean Canal   Total critical care time: 30 minutes  Critical care time was exclusive of separately billable procedures and treating other patients.  Critical care was necessary to treat or prevent imminent or life-threatening deterioration.  Critical care was time spent personally by me on the following activities: development of treatment plan with patient and/or surrogate as well as nursing, discussions with consultants, evaluation of patient's response to treatment, examination of patient, obtaining history from patient or surrogate, ordering and performing treatments and interventions, ordering and review of laboratory studies, ordering and review of radiographic studies, pulse oximetry and re-evaluation of patient's condition.   Medications Ordered in ED Medications  potassium chloride SA (K-DUR,KLOR-CON) CR tablet 40 mEq (has no administration in time range)  potassium chloride 10 mEq in 100 mL IVPB (has no administration in time range)  furosemide (LASIX) injection 40 mg (40 mg Intravenous Given 10/03/18 1730)     Initial Impression / Assessment and Plan / ED Course  I have reviewed the triage vital signs and the nursing notes.  Pertinent labs & imaging results that were available during my care of the patient were reviewed by me and considered in my medical decision making (see chart for details).       [name] is a 58 y.o. male here with leg swelling, scrotal swelling, suicidal ideation. Will repeat labs, BNP. Will consult TTS. Will leave message with case management and his cousin, [name], given his social situation.   5 pm I talked to [place], his cousin. Patient does have a place to stay but rather lives on the street.   7:58 PM Labs showed K 2.6, supplemented. Ate some food as well. He is sleeping and desat to 86% on RA. BNP stable around 1100. Given lasix 40 mg IV and about 3 L came out. Will admit for CHF exacerbation with  hypoxia, hypokalemia.    Final Clinical Impressions(s) / ED Diagnoses   Final diagnoses:  None    ED Discharge Orders    None       Charlynne Pander, MD 10/03/18 1958
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
## 2018-10-03 NOTE — BH Assessment (Signed)
[facility] Assessment Progress Note 1830 hours patient is observed to be impaired and sleeping. Patient could not be roused and will be seen later this date.

## 2018-10-03 NOTE — ED Notes (Signed)
When pt is off oxygen he desats to the 70s and 80s

## 2018-10-04 DIAGNOSIS — E876 Hypokalemia: Secondary | ICD-10-CM

## 2018-10-04 DIAGNOSIS — I10 Essential (primary) hypertension: Secondary | ICD-10-CM

## 2018-10-04 DIAGNOSIS — F200 Paranoid schizophrenia: Secondary | ICD-10-CM

## 2018-10-04 DIAGNOSIS — Z590 Homelessness: Secondary | ICD-10-CM

## 2018-10-04 LAB — BASIC METABOLIC PANEL
Anion gap: 9 (ref 5–15)
BUN: 25 mg/dL — ABNORMAL HIGH (ref 6–20)
CO2: 26 mmol/L (ref 22–32)
Calcium: 8.1 mg/dL — ABNORMAL LOW (ref 8.9–10.3)
Chloride: 105 mmol/L (ref 98–111)
Creatinine, Ser: 1.12 mg/dL (ref 0.61–1.24)
GFR calc Af Amer: >60 mL/min (ref >60)
GFR calc non Af Amer: >60 mL/min (ref >60)
Glucose, Bld: 211 mg/dL — ABNORMAL HIGH (ref 70–99)
Potassium: 3.4 mmol/L — ABNORMAL LOW (ref 3.5–5.1)
Sodium: 140 mmol/L (ref 135–145)

## 2018-10-04 LAB — CBC
HCT: 35.2 % — ABNORMAL LOW (ref 39.0–52.0)
Hemoglobin: 10.2 g/dL — ABNORMAL LOW (ref 13.0–17.0)
MCH: 24.9 pg — ABNORMAL LOW (ref 26.0–34.0)
MCHC: 29 g/dL — ABNORMAL LOW (ref 30.0–36.0)
MCV: 85.9 fL (ref 80.0–100.0)
Platelets: 327 10*3/uL (ref 150–400)
RBC: 4.1 MIL/uL — ABNORMAL LOW (ref 4.22–5.81)
RDW: 17.5 % — ABNORMAL HIGH (ref 11.5–15.5)
WBC: 7.5 10*3/uL (ref 4.0–10.5)
nRBC: 0 % (ref 0.0–0.2)

## 2018-10-04 LAB — GLUCOSE, CAPILLARY
Glucose-Capillary: 76 mg/dL (ref 70–99)
Glucose-Capillary: 234 mg/dL — ABNORMAL HIGH (ref 70–99)
Glucose-Capillary: 219 mg/dL — ABNORMAL HIGH (ref 70–99)
Glucose-Capillary: 190 mg/dL — ABNORMAL HIGH (ref 70–99)

## 2018-10-04 LAB — MAGNESIUM: Magnesium: 2 mg/dL (ref 1.7–2.4)
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
## 2018-10-04 NOTE — Progress Notes (Signed)
Spoke with RN who states that patient kept removing CPAP mask so he is not back on .  Patient is non-compliant with CPAP order.

## 2018-10-04 NOTE — Progress Notes (Addendum)
PROGRESS NOTE    [name]  ZOX:[id number] DOB: [birth year] DOA: 10/03/2018 PCP: Patient, No Pcp Per   Brief Narrative: [name] is a 58 y.o. male with medical history significant for chronic combined systolic and diastolic CHF, insulin-dependent diabetes, hypertension, chronic paranoid schizophrenia, substance use disorder, homelessness, and OSA not on CPAP. Patient presented secondary to LE swelling and found to have a CHF exacerbation. He also expressed suicidal ideation. Currently being diuresed via IV lasix. Psychiatry evaluation pending.   Assessment & Plan:   Principal Problem:   Acute on chronic combined systolic (congestive) and diastolic (congestive) heart failure (HCC) Active Problems:   Insulin-requiring or dependent type II diabetes mellitus (HCC)   Essential hypertension, benign   Chronic paranoid schizophrenia (HCC)   Homelessness   Hypokalemia   Sleep apnea   Acute on chronic combined systolic/diastolic heart failure Associated hypoxia. Patient is on Lasix, lisinopril, coreg as an outpatient. -Continue lisinopril -Continue Lasix IV -Discontinue Coreg secondary to ongoing cocaine use  Acute respiratory failure with hypoxia On 2 L via nasal canula on admission. Down to 1 L today -Wean to room air  Suicidal ideation -Psychiatry recommendations  Schizophrenia -Continue Abilify and Tegretol  Diabetes mellitus, type 2 On Lantus as an outpatient -Continue Lantus and SSI  Tobacco abuse -Continue nicotine patch  BPH -Continue flomax   DVT prophylaxis: Lovenox Code Status:   Code Status: Full Code Family Communication: None at bedside Disposition Plan: Discharge to previous environment vs [facility] pending psychiatry evaluation and transition to oral lasix in addition to transition off of oxygen   Consultants:   Psychiatry  Procedures:   None  Antimicrobials:  None    Subjective: States that a bat resides in his mouth because  his teeth are poor and allowed the bat to get in. He also spoke incoherently about demons. He reports wanting to harm himself secondary to his life circumstances.  Objective: Vitals:   10/03/18 2122 10/03/18 2128 10/04/18 0003 10/04/18 0635  BP: 134/82  138/86 130/82  Pulse: 94  (!) 103 100  Resp: 18  18   Temp: 99.2 F (37.3 C)  98.9 F (37.2 C) 99 F (37.2 C)  TempSrc: Oral  Oral Oral  SpO2: 96%  94% 96%  Weight:  91.3 kg  91 kg  Height:  5\' 9"  (1.753 m)      Intake/Output Summary (Last 24 hours) at 10/04/2018 1144 Last data filed at 10/04/2018 1018 Gross per 24 hour  Intake 1323 ml  Output 5000 ml  Net -3677 ml   Filed Weights   10/03/18 2128 10/04/18 0635  Weight: 91.3 kg 91 kg    Examination:  General exam: Appears calm and comfortable  HEENT: poor dentition Respiratory system: Clear to auscultation. Respiratory effort normal. Cardiovascular system: S1 & S2 heard, RRR. No murmurs, rubs, gallops or clicks. Gastrointestinal system: Abdomen is nondistended, soft and nontender. No organomegaly or masses felt. Normal bowel sounds heard. GU: scrotal swelling Central nervous system: Alert and oriented. No focal neurological deficits. Extremities: No calf tenderness Skin: No cyanosis. No rashes Psychiatry: Judgement and insight appear impaired. Flat affect. SI    Data Reviewed: I have personally reviewed following labs and imaging studies  CBC: Recent Labs  Lab 10/02/18 0258 10/03/18 1650 10/04/18 0303  WBC 8.1 7.3 7.5  NEUTROABS 6.6 6.0  --   HGB 10.7* 11.5* 10.2*  HCT 35.5* 38.9* 35.2*  MCV 84.7 84.7 85.9  PLT 320 331 327   Basic Metabolic Panel:  Recent Labs  Lab 09/29/18 0623 10/02/18 0258 10/03/18 1650 10/03/18 1800 10/04/18 0303  NA 142 139 142  --  140  K 3.2* 3.6 2.6*  --  3.4*  CL 105 104 101  --  105  CO2 30 26 30   --  26  GLUCOSE 186* 205* 193*  --  211*  BUN 11 28* 25*  --  25*  CREATININE 1.04 1.20 1.04  --  1.12  CALCIUM 9.0 8.6* 8.3*   --  8.1*  MG  --   --   --  1.8 2.0   GFR: Estimated Creatinine Clearance: 81.1 mL/min (by C-G formula based on SCr of 1.12 mg/dL). Liver Function Tests: Recent Labs  Lab 10/03/18 1650  AST 36  ALT 20  ALKPHOS 115  BILITOT 0.9  PROT 7.9  ALBUMIN 3.2*   No results for input(s): LIPASE, AMYLASE in the last 168 hours. No results for input(s): AMMONIA in the last 168 hours. Coagulation Profile: No results for input(s): INR, PROTIME in the last 168 hours. Cardiac Enzymes: No results for input(s): CKTOTAL, CKMB, CKMBINDEX, TROPONINI in the last 168 hours. BNP (last 3 results) No results for input(s): PROBNP in the last 8760 hours. HbA1C: No results for input(s): HGBA1C in the last 72 hours. CBG: Recent Labs  Lab 10/04/18 0915  GLUCAP 76   Lipid Profile: No results for input(s): CHOL, HDL, LDLCALC, TRIG, CHOLHDL, LDLDIRECT in the last 72 hours. Thyroid Function Tests: No results for input(s): TSH, T4TOTAL, FREET4, T3FREE, THYROIDAB in the last 72 hours. Anemia Panel: No results for input(s): VITAMINB12, FOLATE, FERRITIN, TIBC, IRON, RETICCTPCT in the last 72 hours. Sepsis Labs: No results for input(s): PROCALCITON, LATICACIDVEN in the last 168 hours.  No results found for this or any previous visit (from the past 240 hour(s)).       Radiology Studies: Dg Chest Port 1 View  Result Date: 10/03/2018 CLINICAL DATA:  Swelling of the lower half of the body. Shortness of breath and cough. EXAM: PORTABLE CHEST 1 VIEW COMPARISON:  10/02/2018.  Multiple prior chest radiographs. FINDINGS: Chronic cardiomegaly with left ventricular prominence. Chronic aortic atherosclerosis. Chronic pulmonary scarring in the left mid lung. No evidence of heart failure or effusion. No active pulmonary process. IMPRESSION: No active disease. Chronic cardiomegaly, aortic atherosclerosis and left mid lung scarring. Electronically Signed   By: Paulina Fusi M.D.   On: 10/03/2018 19:37        Scheduled  Meds: . ARIPiprazole  10 mg Oral Daily  . aspirin EC  81 mg Oral Daily  . atorvastatin  40 mg Oral q1800  . carbamazepine  100 mg Oral BID  . enoxaparin (LOVENOX) injection  40 mg Subcutaneous QHS  . folic acid  1 mg Oral Daily  . furosemide  40 mg Intravenous BID  . insulin aspart  0-9 Units Subcutaneous TID WC  . insulin glargine  14 Units Subcutaneous QHS  . lisinopril  10 mg Oral Daily  . multivitamin with minerals  1 tablet Oral Daily  . nicotine  21 mg Transdermal Daily  . potassium chloride SA  40 mEq Oral BID  . sodium chloride flush  3 mL Intravenous Q12H  . tamsulosin  0.4 mg Oral QPC breakfast  . thiamine  100 mg Oral Daily   Or  . thiamine  100 mg Intravenous Daily   Continuous Infusions:   LOS: 1 day     Jacquelin Hawking, MD [facility] 10/04/2018, 11:44 AM  If 7PM-7AM, please  contact night-coverage [email or web address]

## 2018-10-05 DIAGNOSIS — E119 Type 2 diabetes mellitus without complications: Secondary | ICD-10-CM

## 2018-10-05 DIAGNOSIS — Z794 Long term (current) use of insulin: Secondary | ICD-10-CM

## 2018-10-05 DIAGNOSIS — F4325 Adjustment disorder with mixed disturbance of emotions and conduct: Secondary | ICD-10-CM

## 2018-10-05 LAB — GLUCOSE, CAPILLARY
Glucose-Capillary: 195 mg/dL — ABNORMAL HIGH (ref 70–99)
Glucose-Capillary: 243 mg/dL — ABNORMAL HIGH (ref 70–99)

## 2018-10-05 LAB — BASIC METABOLIC PANEL
Anion gap: 7 (ref 5–15)
BUN: 20 mg/dL (ref 6–20)
CO2: 30 mmol/L (ref 22–32)
Calcium: 8.6 mg/dL — ABNORMAL LOW (ref 8.9–10.3)
Chloride: 101 mmol/L (ref 98–111)
Creatinine, Ser: 1.02 mg/dL (ref 0.61–1.24)
GFR calc Af Amer: >60 mL/min (ref >60)
GFR calc non Af Amer: >60 mL/min (ref >60)
Glucose, Bld: 266 mg/dL — ABNORMAL HIGH (ref 70–99)
Potassium: 3.8 mmol/L (ref 3.5–5.1)
Sodium: 138 mmol/L (ref 135–145)

## 2018-10-05 MED ORDER — TAMSULOSIN HCL 0.4 MG PO CAPS: 0.4000 mg | ORAL_CAPSULE | Freq: Every day | ORAL | 0 refills | Status: DC

## 2018-10-05 NOTE — Discharge Instructions (Signed)
[name],  You were treated for a mild heart failure exacerbation. Please adhere to a strict low salt and low fluid diet.
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
## 2018-10-05 NOTE — Progress Notes (Signed)
Inpatient Diabetes Program Recommendations  AACE/ADA: New Consensus Statement on Inpatient Glycemic Control (2015)  Target Ranges:  Prepandial:   less than 140 mg/dL      Peak postprandial:   less than 180 mg/dL (1-2 hours)      Critically ill patients:  140 - 180 mg/dL   Lab Results  Component Value Date   GLUCAP 243 (H) 10/05/2018   HGBA1C 7.3 (H) 07/18/2018    Review of Glycemic Control Results for [name], Taylor (MRN [id number]) as of 10/05/2018 12:53  Ref. Range 10/04/2018 12:17 10/04/2018 17:10 10/04/2018 20:13 10/05/2018 07:48 10/05/2018 11:51  Glucose-Capillary Latest Ref Range: 70 - 99 mg/dL 436 (H) 067 (H) 703 (H) 195 (H) 243 (H)   Inpatient Diabetes Program Recommendations:   While in the hospital, consider Novolog 3-4 units tid meal coverage if eats 50%  Thank you, [name]. [name], RN, MSN, CDE  Diabetes Coordinator Inpatient Glycemic Control Team Team Pager [phone] (8am-5pm) 10/05/2018 12:54 PM

## 2018-10-05 NOTE — Consult Note (Signed)
WOC Nurse wound consult note Reason for Consult: penile wound Wound type: patient refused   Dressing procedure/placement/frequency: Patient refused assessment of this wound. Would not open eyes or acknowledge WOC nurse.   Topical therapy ordered for partial thickness ulceration of the penis based on report of wound status. If concerned that this wound was not trauma from condom catheter which if yellow base would seem to be longer history for wound consider urology consult.    Discussed POC with patient and bedside nurse.  Re consult if needed, will not follow at this time. Thanks  Merlyn Bollen [employer], RN,CWOCN, CNS, CWON-AP [phone])

## 2018-10-05 NOTE — TOC Initial Note (Signed)
Transition of Care [facility]) - Initial/Assessment Note    Patient Details  Name: [name] MRN: [id number] Date of Birth: 03/17/1961  [facility] CM/SW Contact:    Lanier Clam, RN Phone Number: 10/05/2018, 2:43 PM  Clinical Narrative: Psych-otpt [name] resource.Return back to shelter. Has pharmacy. Will provide bus pass(in shadow chart)-Nurse aware. [name] resource listed on follow up provider.No further CM needs.                  Expected Discharge Plan: Homeless Shelter Barriers to Discharge: No Barriers Identified   Patient Goals and CMS Choice Patient states their goals for this hospitalization and ongoing recovery are:: (go home)      Expected Discharge Plan and Services Expected Discharge Plan: Homeless Shelter   Discharge Planning Services: CM Consult, Indigent Health Clinic     Expected Discharge Date: 10/05/18                        Prior Living Arrangements/Services     Patient language and need for interpreter reviewed:: Yes Do you feel safe going back to the place where you live?: Yes      Need for Family Participation in Patient Care: No (Comment) Care giver support system in place?: No (comment)   Criminal Activity/Legal Involvement Pertinent to Current Situation/Hospitalization: No - Comment as needed  Activities of Daily Living Home Assistive Devices/Equipment: Cane (specify quad or straight) ADL Screening (condition at time of admission) Patient's cognitive ability adequate to safely complete daily activities?: No Is the patient deaf or have difficulty hearing?: No Does the patient have difficulty seeing, even when wearing glasses/contacts?: No Does the patient have difficulty concentrating, remembering, or making decisions?: No Patient able to express need for assistance with ADLs?: Yes Does the patient have difficulty dressing or bathing?: No Independently performs ADLs?: Yes (appropriate for developmental age) Does the  patient have difficulty walking or climbing stairs?: Yes Weakness of Legs: Both Weakness of Arms/Hands: None  Permission Sought/Granted Permission sought to share information with : Case Manager Permission granted to share information with : Yes, Verbal Permission Granted              Emotional Assessment Appearance:: Appears stated age Attitude/Demeanor/Rapport: Attention Seeking Affect (typically observed): Flat Orientation: : Oriented to Self, Oriented to Place, Oriented to  Time, Oriented to Situation Alcohol / Substance Use: Tobacco Use, Alcohol Use, Illicit Drugs Psych Involvement: Outpatient Provider  Admission diagnosis:  Edema Patient Active Problem List   Diagnosis Date Noted  . Adjustment disorder with mixed disturbance of emotions and conduct   . Sleep apnea 10/03/2018  . Hypokalemia 09/17/2018  . Scrotal swelling 09/17/2018  . Urinary hesitancy   . Constipation   . Acute on chronic combined systolic and diastolic CHF (congestive heart failure) (HCC) 08/25/2018  . Acute respiratory failure with hypoxia (HCC) 08/25/2018  . Acute on chronic combined systolic and diastolic congestive heart failure (HCC) 08/18/2018  . Tobacco use 08/18/2018  . 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 Homelessness 08/08/2018  . Smoker 08/08/2018  . Acute exacerbation of CHF (congestive heart failure) (HCC) 03/28/2018  . Prostate enlargement 03/16/2018  . Aortic atherosclerosis (HCC) 03/16/2018  . 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 Chronic foot pain   . Schizoaffective disorder, bipolar type (HCC) 09/30/2016  . Substance induced mood disorder (HCC) 03/13/2015  . Acute kidney failure (HCC) 01/26/2015  . Schizophrenia, paranoid  type (HCC) 01/17/2015  . Suicidal ideation   . Drug hallucinosis (HCC) 10/08/2014  . Chronic paranoid schizophrenia (HCC) 09/07/2014  . Substance or medication-induced bipolar  and related disorder with onset during intoxication (HCC) 08/10/2014  . Urinary retention   . Cocaine use disorder, severe, dependence (HCC)   . Essential hypertension, benign 03/28/2013  . Insulin-requiring or dependent type II diabetes mellitus (HCC) 03/15/2013   PCP:  Patient, No Pcp Per Pharmacy:   [id number] - [place], [place] - [address] [place] [address] Phone: [phone] Fax: [phone]  [facility] - [place], [place] - [address] [place] [address] Phone: [phone] Fax: [phone]  [facility] [phone] - [place], [place] - [place] [address] AT [facility] [address] [facility] [address] [place] [address] Phone: [phone] Fax: [phone]     Social Determinants of Health (SDOH) Interventions    Readmission Risk Interventions Readmission Risk Prevention Plan 10/05/2018  Transportation Screening Complete  Medication Review (RN Care Manager) Complete  PCP or Specialist appointment within 3-5 days of discharge Complete  HRI or Home Care Consult Patient refused  SW Recovery Care/Counseling Consult Complete  Palliative Care Screening Not Applicable  Skilled Nursing Facility Not Applicable  Some recent data might be hidden

## 2018-10-05 NOTE — Progress Notes (Signed)
PROGRESS NOTE    LENDEN SIRBAUGH  ZOX:[id number] DOB: Dec 26, 1960 DOA: 10/03/2018 PCP: Patient, No Pcp Per   Brief Narrative: [name] is a 58 y.o. male with medical history significant for chronic combined systolic and diastolic CHF, insulin-dependent diabetes, hypertension, chronic paranoid schizophrenia, substance use disorder, homelessness, and OSA not on CPAP. Patient presented secondary to LE swelling and found to have a CHF exacerbation. He also expressed suicidal ideation. Currently being diuresed via IV lasix. Psychiatry evaluation pending.   Assessment & Plan:   Principal Problem:   Acute on chronic combined systolic (congestive) and diastolic (congestive) heart failure (HCC) Active Problems:   Insulin-requiring or dependent type II diabetes mellitus (HCC)   Essential hypertension, benign   Chronic paranoid schizophrenia (HCC)   Homelessness   Hypokalemia   Sleep apnea   Acute on chronic combined systolic/diastolic heart failure Associated hypoxia. Patient is on Lasix, lisinopril, coreg as an outpatient. -Continue lisinopril -Continue Lasix IV -Discontinue Coreg secondary to ongoing cocaine use -Repeat BMP this morning  Acute respiratory failure with hypoxia On 2 L via nasal canula on admission. Weaned to room air. Resolved.  Suicidal ideation -Psychiatry recommendations pending  Schizophrenia -Continue Abilify and Tegretol  Diabetes mellitus, type 2 On Lantus as an outpatient -Continue Lantus and SSI  Tobacco abuse -Continue nicotine patch  BPH -Continue flomax   DVT prophylaxis: Lovenox Code Status:   Code Status: Full Code Family Communication: None at bedside Disposition Plan: Discharge to previous environment vs [facility] pending psychiatry evaluation and transition to oral lasix in addition to transition off of oxygen   Consultants:   Psychiatry  Procedures:   None  Antimicrobials:  None    Subjective: No concerns today  per patient.  Objective: Vitals:   10/04/18 1305 10/04/18 1316 10/04/18 2019 10/05/18 0523  BP: 129/87  140/90 (!) 135/99  Pulse: 98  (!) 101 97  Resp: 18  18 18   Temp: 98.5 F (36.9 C)  99.6 F (37.6 C) 97.8 F (36.6 C)  TempSrc: Oral  Oral Oral  SpO2: (!) 88% 93% 92% 90%  Weight:      Height:        Intake/Output Summary (Last 24 hours) at 10/05/2018 1018 Last data filed at 10/04/2018 1852 Gross per 24 hour  Intake 1340 ml  Output 1780 ml  Net -440 ml   Filed Weights   10/03/18 2128 10/04/18 0635  Weight: 91.3 kg 91 kg    Examination:  General exam: Appears calm and comfortable Respiratory system: Clear to auscultation. Respiratory effort normal. Cardiovascular system: S1 & S2 heard, RRR. No murmurs, rubs, gallops or clicks. Gastrointestinal system: Abdomen is nondistended, soft and nontender. No organomegaly or masses felt. Normal bowel sounds heard. Central nervous system: Eyes were closed with minimal response to verbal requests but immediately opened his eyes when he heard food was being delivered to the room. No focal neurological deficits. Extremities: No edema. No calf tenderness Skin: No cyanosis. No rashes Psychiatry: Judgement and insight appear normal. Mood & affect appropriate.    Data Reviewed: I have personally reviewed following labs and imaging studies  CBC: Recent Labs  Lab 10/02/18 0258 10/03/18 1650 10/04/18 0303  WBC 8.1 7.3 7.5  NEUTROABS 6.6 6.0  --   HGB 10.7* 11.5* 10.2*  HCT 35.5* 38.9* 35.2*  MCV 84.7 84.7 85.9  PLT 320 331 327   Basic Metabolic Panel: Recent Labs  Lab 09/29/18 0623 10/02/18 0258 10/03/18 1650 10/03/18 1800 10/04/18 0303  NA 142  139 142  --  140  K 3.2* 3.6 2.6*  --  3.4*  CL 105 104 101  --  105  CO2 30 26 30   --  26  GLUCOSE 186* 205* 193*  --  211*  BUN 11 28* 25*  --  25*  CREATININE 1.04 1.20 1.04  --  1.12  CALCIUM 9.0 8.6* 8.3*  --  8.1*  MG  --   --   --  1.8 2.0   GFR: Estimated Creatinine  Clearance: 81.1 mL/min (by C-G formula based on SCr of 1.12 mg/dL). Liver Function Tests: Recent Labs  Lab 10/03/18 1650  AST 36  ALT 20  ALKPHOS 115  BILITOT 0.9  PROT 7.9  ALBUMIN 3.2*   No results for input(s): LIPASE, AMYLASE in the last 168 hours. No results for input(s): AMMONIA in the last 168 hours. Coagulation Profile: No results for input(s): INR, PROTIME in the last 168 hours. Cardiac Enzymes: No results for input(s): CKTOTAL, CKMB, CKMBINDEX, TROPONINI in the last 168 hours. BNP (last 3 results) No results for input(s): PROBNP in the last 8760 hours. HbA1C: No results for input(s): HGBA1C in the last 72 hours. CBG: Recent Labs  Lab 10/04/18 0915 10/04/18 1217 10/04/18 1710 10/04/18 2013 10/05/18 0748  GLUCAP 76 234* 219* 190* 195*   Lipid Profile: No results for input(s): CHOL, HDL, LDLCALC, TRIG, CHOLHDL, LDLDIRECT in the last 72 hours. Thyroid Function Tests: No results for input(s): TSH, T4TOTAL, FREET4, T3FREE, THYROIDAB in the last 72 hours. Anemia Panel: No results for input(s): VITAMINB12, FOLATE, FERRITIN, TIBC, IRON, RETICCTPCT in the last 72 hours. Sepsis Labs: No results for input(s): PROCALCITON, LATICACIDVEN in the last 168 hours.  No results found for this or any previous visit (from the past 240 hour(s)).       Radiology Studies: Dg Chest Port 1 View  Result Date: 10/03/2018 CLINICAL DATA:  Swelling of the lower half of the body. Shortness of breath and cough. EXAM: PORTABLE CHEST 1 VIEW COMPARISON:  10/02/2018.  Multiple prior chest radiographs. FINDINGS: Chronic cardiomegaly with left ventricular prominence. Chronic aortic atherosclerosis. Chronic pulmonary scarring in the left mid lung. No evidence of heart failure or effusion. No active pulmonary process. IMPRESSION: No active disease. Chronic cardiomegaly, aortic atherosclerosis and left mid lung scarring. Electronically Signed   By: Paulina Fusi M.D.   On: 10/03/2018 19:37         Scheduled Meds: . ARIPiprazole  10 mg Oral Daily  . aspirin EC  81 mg Oral Daily  . atorvastatin  40 mg Oral q1800  . carbamazepine  100 mg Oral BID  . enoxaparin (LOVENOX) injection  40 mg Subcutaneous QHS  . folic acid  1 mg Oral Daily  . furosemide  40 mg Intravenous BID  . insulin aspart  0-9 Units Subcutaneous TID WC  . insulin glargine  14 Units Subcutaneous QHS  . lisinopril  10 mg Oral Daily  . multivitamin with minerals  1 tablet Oral Daily  . nicotine  21 mg Transdermal Daily  . potassium chloride SA  40 mEq Oral BID  . sodium chloride flush  3 mL Intravenous Q12H  . tamsulosin  0.4 mg Oral QPC breakfast  . thiamine  100 mg Oral Daily   Or  . thiamine  100 mg Intravenous Daily   Continuous Infusions:   LOS: 2 days     Jacquelin Hawking, MD [facility] 10/05/2018, 10:18 AM  If 7PM-7AM, please contact night-coverage [email or web address]

## 2018-10-05 NOTE — Consult Note (Signed)
[facility] Face-to-Face Psychiatry Consult   Reason for Consult: SI Referring Physician:  Dr. Jacquelin Hawking Patient Identification: MAKOA SATZ MRN:  [id number] Principal Diagnosis: Adjustment disorder with mixed disturbance of emotions and conduct Diagnosis:  Principal Problem:   Acute on chronic combined systolic (congestive) and diastolic (congestive) heart failure (HCC) Active Problems:   Insulin-requiring or dependent type II diabetes mellitus (HCC)   Essential hypertension, benign   Chronic paranoid schizophrenia (HCC)   Homelessness   Hypokalemia   Sleep apnea   Total Time spent with patient: 1 hour  Subjective:   SILVESTER REIERSON is a 58 y.o. male patient admitted with acute on chronic combined systolic and diastolic CHF.  HPI:   Per chart review, patient was admitted with acute on chronic combined systolic and diastolic CHF. He endorsed SI on admission so psychiatry was consulted. Of note, he was last seen by psychiatry on 3/24 for SI in the setting of homelessness. He was psychiatrically cleared with plan to follow up at [facility].   On interview, Mr. Fatheree is drowsy. He reports some anxiety today but denies SI, HI or AVH. He reports that homelessness is an ongoing stressors. He denies problems with his medications.   Past Psychiatric History: Schizophrenia and cocaine abuse.   Risk to Self:  None. Denies SI.  Risk to Others:  None. Denies HI.  Prior Inpatient Therapy:  He has a history of multiple psychiatric hospitalizations.  Prior Outpatient Therapy:  He is followed at [facility].   Past Medical History:  Past Medical History:  Diagnosis Date  . Chronic foot pain   . Cocaine abuse (HCC)   . Diabetes mellitus without complication (HCC)   . Hepatitis C    unsure   . Homelessness   . Hypertension   . Neuropathy   . Polysubstance abuse (HCC)   . Schizophrenia (HCC)   . Sleep apnea   . Systolic and diastolic CHF, chronic (HCC)     Past Surgical History:   Procedure Laterality Date  . MULTIPLE TOOTH EXTRACTIONS     Family History:  Family History  Problem Relation Age of Onset  . Hypertension Other   . Diabetes Other    Family Psychiatric  History: None per chart review.  Social History:  Social History   Substance and Sexual Activity  Alcohol Use Yes   Comment: Daily Drinker      Social History   Substance and Sexual Activity  Drug Use Yes  . Frequency: 7.0 times per week  . Types: "Crack" cocaine, Cocaine, Marijuana    Social History   Socioeconomic History  . Marital status: Single    Spouse name: Not on file  . Number of children: Not on file  . Years of education: Not on file  . Highest education level: Not on file  Occupational History  . Not on file  Social Needs  . Financial resource strain: Not on file  . Food insecurity:    Worry: Not on file    Inability: Not on file  . Transportation needs:    Medical: Not on file    Non-medical: Not on file  Tobacco Use  . Smoking status: Current Every Day Smoker    Packs/day: 1.00    Years: 20.00    Pack years: 20.00    Types: Cigarettes  . Smokeless tobacco: Current User  Substance and Sexual Activity  . Alcohol use: Yes    Comment: Daily Drinker   . Drug use: Yes  Frequency: 7.0 times per week    Types: "Crack" cocaine, Cocaine, Marijuana  . Sexual activity: Not Currently  Lifestyle  . Physical activity:    Days per week: Not on file    Minutes per session: Not on file  . Stress: Not on file  Relationships  . Social connections:    Talks on phone: Not on file    Gets together: Not on file    Attends religious service: Not on file    Active member of club or organization: Not on file    Attends meetings of clubs or organizations: Not on file    Relationship status: Not on file  Other Topics Concern  . Not on file  Social History Narrative   ** Merged History Encounter **       Additional Social History: He is homeless. He has a history of  crack cocaine use. UDS is positive for cocaine on admission.     Allergies:   Allergies  Allergen Reactions  . Haldol [Haloperidol] Other (See Comments)    Muscle spasms, loss of voluntary movement. However, pt has taken Thorazine on multiple occasions with no adverse effects.     Labs:  Results for orders placed or performed during the hospital encounter of 10/03/18 (from the past 48 hour(s))  CBC with Differential/Platelet     Status: Abnormal   Collection Time: 10/03/18  4:50 PM  Result Value Ref Range   WBC 7.3 4.0 - 10.5 K/uL   RBC 4.59 4.22 - 5.81 MIL/uL   Hemoglobin 11.5 (L) 13.0 - 17.0 g/dL   HCT 16.1 (L) 09.6 - 04.5 %   MCV 84.7 80.0 - 100.0 fL   MCH 25.1 (L) 26.0 - 34.0 pg   MCHC 29.6 (L) 30.0 - 36.0 g/dL   RDW 40.9 (H) 81.1 - 91.4 %   Platelets 331 150 - 400 K/uL   nRBC 0.0 0.0 - 0.2 %   Neutrophils Relative % 82 %   Neutro Abs 6.0 1.7 - 7.7 K/uL   Lymphocytes Relative 11 %   Lymphs Abs 0.8 0.7 - 4.0 K/uL   Monocytes Relative 7 %   Monocytes Absolute 0.5 0.1 - 1.0 K/uL   Eosinophils Relative 0 %   Eosinophils Absolute 0.0 0.0 - 0.5 K/uL   Basophils Relative 0 %   Basophils Absolute 0.0 0.0 - 0.1 K/uL   Immature Granulocytes 0 %   Abs Immature Granulocytes 0.03 0.00 - 0.07 K/uL    Comment: Performed at [facility], [address], [place], [place] [address]  Comprehensive metabolic panel     Status: Abnormal   Collection Time: 10/03/18  4:50 PM  Result Value Ref Range   Sodium 142 135 - 145 mmol/L   Potassium 2.6 (LL) 3.5 - 5.1 mmol/L    Comment: CRITICAL RESULT CALLED TO, READ BACK BY AND VERIFIED WITH: B.JESSEE,RN [id number]  BY V.WILKINS DELTA CHECK NOTED    Chloride 101 98 - 111 mmol/L   CO2 30 22 - 32 mmol/L   Glucose, Bld 193 (H) 70 - 99 mg/dL   BUN 25 (H) 6 - 20 mg/dL   Creatinine, Ser 6.57 0.61 - 1.24 mg/dL   Calcium 8.3 (L) 8.9 - 10.3 mg/dL   Total Protein 7.9 6.5 - 8.1 g/dL   Albumin 3.2 (L) 3.5 - 5.0 g/dL   AST 36 15 -  41 U/L   ALT 20 0 - 44 U/L   Alkaline Phosphatase 115 38 - 126 U/L  Total Bilirubin 0.9 0.3 - 1.2 mg/dL   GFR calc non Af Amer >60 >60 mL/min   GFR calc Af Amer >60 >60 mL/min   Anion gap 11 5 - 15    Comment: Performed at [facility], [address], [place], [place] [address]  Ethanol     Status: None   Collection Time: 10/03/18  4:50 PM  Result Value Ref Range   Alcohol, Ethyl (B) <10 <10 mg/dL    Comment: (NOTE) Lowest detectable limit for serum alcohol is 10 mg/dL. For medical purposes only. Performed at [facility], [address], [place], [place] [address]   Salicylate level     Status: None   Collection Time: 10/03/18  4:50 PM  Result Value Ref Range   Salicylate Lvl <7.0 2.8 - 30.0 mg/dL    Comment: Performed at [facility], [address] [place], [place] [address]  Acetaminophen level     Status: Abnormal   Collection Time: 10/03/18  4:50 PM  Result Value Ref Range   Acetaminophen (Tylenol), Serum <10 (L) 10 - 30 ug/mL    Comment: (NOTE) Therapeutic concentrations vary significantly. A range of 10-30 ug/mL  may be an effective concentration for many patients. However, some  are best treated at concentrations outside of this range. Acetaminophen concentrations >150 ug/mL at 4 hours after ingestion  and >50 ug/mL at 12 hours after ingestion are often associated with  toxic reactions. Performed at [facility], [address] [place], [place] [address]   Rapid urine drug screen (hospital performed)     Status: Abnormal   Collection Time: 10/03/18  4:50 PM  Result Value Ref Range   Opiates NONE DETECTED NONE DETECTED   Cocaine POSITIVE (A) NONE DETECTED   Benzodiazepines NONE DETECTED NONE DETECTED   Amphetamines NONE DETECTED NONE DETECTED   Tetrahydrocannabinol NONE DETECTED NONE DETECTED   Barbiturates NONE DETECTED NONE DETECTED    Comment: (NOTE) DRUG SCREEN FOR MEDICAL  PURPOSES ONLY.  IF CONFIRMATION IS NEEDED FOR ANY PURPOSE, NOTIFY LAB WITHIN 5 DAYS. LOWEST DETECTABLE LIMITS FOR URINE DRUG SCREEN Drug Class                     Cutoff (ng/mL) Amphetamine and metabolites    1000 Barbiturate and metabolites    200 Benzodiazepine                 200 Tricyclics and metabolites     300 Opiates and metabolites        300 Cocaine and metabolites        300 THC                            50 Performed at [facility], [address] [place], [place] [address]   Urinalysis, Routine w reflex microscopic     Status: Abnormal   Collection Time: 10/03/18  4:51 PM  Result Value Ref Range   Color, Urine COLORLESS (A) YELLOW   APPearance CLEAR CLEAR   Specific Gravity, Urine 1.004 (L) 1.005 - 1.030   pH 8.0 5.0 - 8.0   Glucose, UA NEGATIVE NEGATIVE mg/dL   Hgb urine dipstick SMALL (A) NEGATIVE   Bilirubin Urine NEGATIVE NEGATIVE   Ketones, ur NEGATIVE NEGATIVE mg/dL   Protein, ur NEGATIVE NEGATIVE mg/dL   Nitrite NEGATIVE NEGATIVE   Leukocytes,Ua NEGATIVE NEGATIVE   RBC / HPF  0-5 0 - 5 RBC/hpf   WBC, UA 0-5 0 - 5 WBC/hpf   Bacteria, UA NONE SEEN NONE SEEN    Comment: Performed at [facility], [address], [place], [place] [address]  Brain natriuretic peptide     Status: Abnormal   Collection Time: 10/03/18  4:51 PM  Result Value Ref Range   B Natriuretic Peptide 1,117.6 (H) 0.0 - 100.0 pg/mL    Comment: Performed at [facility], [address], [place], [place] [address]  Magnesium     Status: None   Collection Time: 10/03/18  6:00 PM  Result Value Ref Range   Magnesium 1.8 1.7 - 2.4 mg/dL    Comment: Performed at [facility], [address] [place], [place] [address]  CBC     Status: Abnormal   Collection Time: 10/04/18  3:03 AM  Result Value Ref Range   WBC 7.5 4.0 - 10.5 K/uL   RBC 4.10 (L) 4.22 - 5.81 MIL/uL   Hemoglobin 10.2 (L) 13.0 - 17.0 g/dL   HCT 33.5  (L) 82.5 - 52.0 %   MCV 85.9 80.0 - 100.0 fL   MCH 24.9 (L) 26.0 - 34.0 pg   MCHC 29.0 (L) 30.0 - 36.0 g/dL   RDW 18.9 (H) 84.2 - 10.3 %   Platelets 327 150 - 400 K/uL   nRBC 0.0 0.0 - 0.2 %    Comment: Performed at [facility], [address] [place], [place] [address]  Basic metabolic panel     Status: Abnormal   Collection Time: 10/04/18  3:03 AM  Result Value Ref Range   Sodium 140 135 - 145 mmol/L   Potassium 3.4 (L) 3.5 - 5.1 mmol/L    Comment: NO VISIBLE HEMOLYSIS DELTA CHECK NOTED    Chloride 105 98 - 111 mmol/L   CO2 26 22 - 32 mmol/L   Glucose, Bld 211 (H) 70 - 99 mg/dL   BUN 25 (H) 6 - 20 mg/dL   Creatinine, Ser 8.86 0.61 - 1.24 mg/dL   Calcium 8.1 (L) 8.9 - 10.3 mg/dL   GFR calc non Af Amer >60 >60 mL/min   GFR calc Af Amer >60 >60 mL/min   Anion gap 9 5 - 15    Comment: Performed at [facility], [address], [place], [place] [address]  Magnesium     Status: None   Collection Time: 10/04/18  3:03 AM  Result Value Ref Range   Magnesium 2.0 1.7 - 2.4 mg/dL    Comment: Performed at [facility], [address], [place], [place] [address]  Glucose, capillary     Status: None   Collection Time: 10/04/18  9:15 AM  Result Value Ref Range   Glucose-Capillary 76 70 - 99 mg/dL  Glucose, capillary     Status: Abnormal   Collection Time: 10/04/18 12:17 PM  Result Value Ref Range   Glucose-Capillary 234 (H) 70 - 99 mg/dL  Glucose, capillary     Status: Abnormal   Collection Time: 10/04/18  5:10 PM  Result Value Ref Range   Glucose-Capillary 219 (H) 70 - 99 mg/dL  Glucose, capillary     Status: Abnormal   Collection Time: 10/04/18  8:13 PM  Result Value Ref Range   Glucose-Capillary 190 (H) 70 - 99 mg/dL  Glucose, capillary     Status: Abnormal   Collection Time: 10/05/18  7:48 AM  Result Value Ref Range  Glucose-Capillary 195 (H) 70 - 99 mg/dL    Current Facility-Administered Medications  Medication  Dose Route Frequency Provider Last Rate Last Dose  . acetaminophen (TYLENOL) tablet 650 mg  650 mg Oral Q6H PRN Charlsie Quest, MD   650 mg at 10/03/18 2153   Or  . acetaminophen (TYLENOL) suppository 650 mg  650 mg Rectal Q6H PRN Darreld Mclean R, MD      . ARIPiprazole (ABILIFY) tablet 10 mg  10 mg Oral Daily Charlsie Quest, MD   10 mg at 10/04/18 0945  . aspirin EC tablet 81 mg  81 mg Oral Daily Charlsie Quest, MD   81 mg at 10/04/18 0944  . atorvastatin (LIPITOR) tablet 40 mg  40 mg Oral q1800 Charlsie Quest, MD   40 mg at 10/04/18 1831  . carbamazepine (TEGRETOL) tablet 100 mg  100 mg Oral BID Darreld Mclean R, MD   100 mg at 10/05/18 0813  . enoxaparin (LOVENOX) injection 40 mg  40 mg Subcutaneous QHS Darreld Mclean R, MD   40 mg at 10/04/18 2050  . folic acid (FOLVITE) tablet 1 mg  1 mg Oral Daily Darreld Mclean R, MD   1 mg at 10/05/18 1610  . furosemide (LASIX) injection 40 mg  40 mg Intravenous BID Charlsie Quest, MD   40 mg at 10/05/18 0814  . insulin aspart (novoLOG) injection 0-9 Units  0-9 Units Subcutaneous TID WC Charlsie Quest, MD   2 Units at 10/05/18 [phone]  . insulin glargine (LANTUS) injection 14 Units  14 Units Subcutaneous QHS Charlsie Quest, MD   14 Units at 10/04/18 2050  . lisinopril (PRINIVIL,ZESTRIL) tablet 10 mg  10 mg Oral Daily Charlsie Quest, MD   10 mg at 10/05/18 5409  . LORazepam (ATIVAN) tablet 1 mg  1 mg Oral Q6H PRN Charlsie Quest, MD       Or  . LORazepam (ATIVAN) injection 1 mg  1 mg Intravenous Q6H PRN Darreld Mclean R, MD   1 mg at 10/05/18 0510  . multivitamin with minerals tablet 1 tablet  1 tablet Oral Daily Charlsie Quest, MD   1 tablet at 10/05/18 8119  . nicotine (NICODERM CQ - dosed in mg/24 hours) patch 21 mg  21 mg Transdermal Daily Charlsie Quest, MD   21 mg at 10/05/18 0815  . potassium chloride SA (K-DUR,KLOR-CON) CR tablet 40 mEq  40 mEq Oral BID Charlsie Quest, MD   40 mEq at 10/05/18 0814  . sodium chloride flush (NS) 0.9 % injection 3  mL  3 mL Intravenous Q12H Charlsie Quest, MD   3 mL at 10/05/18 1478  . tamsulosin (FLOMAX) capsule 0.4 mg  0.4 mg Oral QPC breakfast Darreld Mclean R, MD   0.4 mg at 10/05/18 2956  . thiamine (VITAMIN B-1) tablet 100 mg  100 mg Oral Daily Darreld Mclean R, MD   100 mg at 10/04/18 2130   Or  . thiamine (B-1) injection 100 mg  100 mg Intravenous Daily Charlsie Quest, MD   100 mg at 10/05/18 8657    Musculoskeletal: Strength & Muscle Tone: within normal limits Gait & Station: UTA since patient is lying in bed. Patient leans: N/A  Psychiatric Specialty Exam: Physical Exam  Nursing note and vitals reviewed. Constitutional: He is oriented to person, place, and time. He appears well-developed and well-nourished.  HENT:  Head: Normocephalic and atraumatic.  Neck: Normal range of motion.  Respiratory:  Effort normal.  Musculoskeletal: Normal range of motion.  Neurological: He is alert and oriented to person, place, and time.  Psychiatric: His speech is normal and behavior is normal. Thought content normal. His mood appears anxious. Cognition and memory are normal. He expresses impulsivity.    Review of Systems  Psychiatric/Behavioral: Positive for substance abuse. Negative for hallucinations and suicidal ideas. The patient is nervous/anxious.   All other systems reviewed and are negative.   Blood pressure (!) 135/99, pulse 97, temperature 97.8 F (36.6 C), temperature source Oral, resp. rate 18, height 5\' 9"  (1.753 m), weight 91 kg, SpO2 90 %.Body mass index is 29.63 kg/m.  General Appearance: Fairly Groomed, middle aged, African American male, wearing a hospital gown who is sitting in bed. NAD.   Eye Contact:  Poor  Speech:  Clear and Coherent and Normal Rate  Volume:  Normal  Mood:  Anxious  Affect:  Appropriate  Thought Process:  Linear and Descriptions of Associations: Intact  Orientation:  Full (Time, Place, and Person)  Thought Content:  Logical  Suicidal Thoughts:  No   Homicidal Thoughts:  No  Memory:  Immediate;   Good Recent;   Good Remote;   Good  Judgement:  Fair  Insight:  Fair  Psychomotor Activity:  Normal  Concentration:  Concentration: Good and Attention Span: Good  Recall:  Good  Fund of Knowledge:  Good  Language:  Good  Akathisia:  No  Handed:  Right  AIMS (if indicated):   N/A  Assets:  ArchitectCommunication Skills Financial Resources/Insurance Resilience  ADL's:  Intact  Cognition:  WNL  Sleep:   Okay   Assessment:  Mellody MemosFrederick L Oyola is a 58 y.o. male who was admitted with acute on chronic combined systolic and diastolic CHF. He denies SI, HI or AVH. He frequently endorses SI in the setting of homelessness and other psychosocial stressors. He does not have any intention to harm self. He should follow up with his outpatient mental health clinician. He does not warrant inpatient psychiatric hospitalization at this time.   Treatment Plan Summary: -Continue Abilify 10 mg daily for mood stabilization. -Continue Tegretol 100 mg BID for mood stabilization.  -Continue CIWA with Ativan for alcohol withdrawal.  -EKG reviewed and QTc 470 on 4/4. Please closely monitor when starting or increasing QTc prolonging agents.  -Psychiatry will sign off on patient at this time. Please consult psychiatry again as needed.    Disposition: No evidence of imminent risk to self or others at present.   Patient does not meet criteria for psychiatric inpatient admission.  Cherly BeachJacqueline J Bakari Nikolai, DO 10/05/2018 10:52 AM

## 2018-10-06 ENCOUNTER — Emergency Department (HOSPITAL_COMMUNITY)
Admission: EM | Admit: 2018-10-06 | Discharge: 2018-10-06 | Disposition: A | Discharge status: Home / Self Care | Payer: Medicaid Other | Attending: Emergency Medicine | Admitting: Emergency Medicine

## 2018-10-06 ENCOUNTER — Encounter (HOSPITAL_COMMUNITY): Payer: Self-pay | Admitting: Emergency Medicine

## 2018-10-06 ENCOUNTER — Other Ambulatory Visit: Payer: Self-pay

## 2018-10-06 DIAGNOSIS — I110 Hypertensive heart disease with heart failure: Secondary | ICD-10-CM | POA: Insufficient documentation

## 2018-10-06 DIAGNOSIS — F1290 Cannabis use, unspecified, uncomplicated: Secondary | ICD-10-CM | POA: Insufficient documentation

## 2018-10-06 DIAGNOSIS — R609 Edema, unspecified: Secondary | ICD-10-CM

## 2018-10-06 DIAGNOSIS — R600 Localized edema: Secondary | ICD-10-CM | POA: Insufficient documentation

## 2018-10-06 DIAGNOSIS — N492 Inflammatory disorders of scrotum: Secondary | ICD-10-CM | POA: Diagnosis not present

## 2018-10-06 DIAGNOSIS — Z7982 Long term (current) use of aspirin: Secondary | ICD-10-CM | POA: Insufficient documentation

## 2018-10-06 DIAGNOSIS — I5042 Chronic combined systolic (congestive) and diastolic (congestive) heart failure: Secondary | ICD-10-CM | POA: Diagnosis not present

## 2018-10-06 DIAGNOSIS — Z794 Long term (current) use of insulin: Secondary | ICD-10-CM | POA: Diagnosis not present

## 2018-10-06 DIAGNOSIS — Z79899 Other long term (current) drug therapy: Secondary | ICD-10-CM | POA: Insufficient documentation

## 2018-10-06 DIAGNOSIS — F17210 Nicotine dependence, cigarettes, uncomplicated: Secondary | ICD-10-CM | POA: Diagnosis not present

## 2018-10-06 DIAGNOSIS — F1490 Cocaine use, unspecified, uncomplicated: Secondary | ICD-10-CM | POA: Insufficient documentation

## 2018-10-06 DIAGNOSIS — E1165 Type 2 diabetes mellitus with hyperglycemia: Secondary | ICD-10-CM | POA: Insufficient documentation

## 2018-10-06 DIAGNOSIS — R739 Hyperglycemia, unspecified: Secondary | ICD-10-CM

## 2018-10-06 DIAGNOSIS — F209 Schizophrenia, unspecified: Secondary | ICD-10-CM | POA: Diagnosis not present

## 2018-10-06 LAB — BASIC METABOLIC PANEL
Anion gap: 6 (ref 5–15)
BUN: 18 mg/dL (ref 6–20)
CO2: 31 mmol/L (ref 22–32)
Calcium: 8.8 mg/dL — ABNORMAL LOW (ref 8.9–10.3)
Chloride: 100 mmol/L (ref 98–111)
Creatinine, Ser: 1.12 mg/dL (ref 0.61–1.24)
GFR calc Af Amer: >60 mL/min (ref >60)
GFR calc non Af Amer: >60 mL/min (ref >60)
Glucose, Bld: 431 mg/dL — ABNORMAL HIGH (ref 70–99)
Potassium: 4.1 mmol/L (ref 3.5–5.1)
Sodium: 137 mmol/L (ref 135–145)

## 2018-10-06 LAB — CBC
HCT: 32.6 % — ABNORMAL LOW (ref 39.0–52.0)
Hemoglobin: 9.8 g/dL — ABNORMAL LOW (ref 13.0–17.0)
MCH: 24.9 pg — ABNORMAL LOW (ref 26.0–34.0)
MCHC: 30.1 g/dL (ref 30.0–36.0)
MCV: 82.7 fL (ref 80.0–100.0)
Platelets: 303 10*3/uL (ref 150–400)
RBC: 3.94 MIL/uL — ABNORMAL LOW (ref 4.22–5.81)
RDW: 17.7 % — ABNORMAL HIGH (ref 11.5–15.5)
WBC: 9.2 10*3/uL (ref 4.0–10.5)
nRBC: 0 % (ref 0.0–0.2)

## 2018-10-06 MED ORDER — ACETAMINOPHEN 325 MG PO TABS
650.0000 mg | ORAL_TABLET | Freq: Once | ORAL | Status: AC
  Administered 2018-10-06: 650 mg via ORAL
  Filled 2018-10-06: qty 2

## 2018-10-06 MED ORDER — FUROSEMIDE 20 MG PO TABS
40.0000 mg | ORAL_TABLET | Freq: Every day | ORAL | Status: DC
  Administered 2018-10-06: 40 mg via ORAL
  Filled 2018-10-06: qty 2

## 2018-10-06 MED ORDER — INSULIN ASPART 100 UNIT/ML ~~LOC~~ SOLN
8.0000 [IU] | Freq: Once | SUBCUTANEOUS | Status: AC
  Administered 2018-10-06: 8 [IU] via INTRAVENOUS

## 2018-10-06 MED ORDER — INSULIN GLARGINE 100 UNIT/ML ~~LOC~~ SOLN
14.0000 [IU] | Freq: Every day | SUBCUTANEOUS | Status: DC
  Administered 2018-10-06: 10:00:00 14 [IU] via SUBCUTANEOUS
  Filled 2018-10-06 (×2): qty 0.14

## 2018-10-06 NOTE — ED Provider Notes (Signed)
[facility]   CSN: [id number] Arrival date & time:       History   Chief Complaint Chief Complaint  Patient presents with   Leg Swelling    HPI [name] is a 58 y.o. male.     Patient presents to the emergency room for evaluation of leg and scrotal swelling.  Patient has a history of congestive heart failure.  He has a history of frequent visits to the emergency room for similar complaints.  He has been in the ED 42 times in the past 6 months.  Patient was just admitted to the hospital a couple of days ago for similar symptoms and was released.  Patient states he feels like his scrotum and his legs are getting swollen again.  He took his medications yesterday but he did not take them today because his sister is at work.  His blood sugar is also elevated.  Patient also states he has had some perianal irritation.  He thinks he saw some blood and might have a hemorrhoid.   he denies any nausea vomiting.  No shortness of breath.  No fevers or chills.  Patient is hungry and would like something to eat.     Past Medical History:  Diagnosis Date   Chronic foot pain    Cocaine abuse (HCC)    Diabetes mellitus without complication (HCC)    Hepatitis C    unsure    Homelessness    Hypertension    Neuropathy    Polysubstance abuse (HCC)    Schizophrenia (HCC)    Sleep apnea    Systolic and diastolic CHF, chronic (HCC)     Patient Active Problem List   Diagnosis Date Noted   Adjustment disorder with mixed disturbance of emotions and conduct    Sleep apnea 10/03/2018   Hypokalemia 09/17/2018   Scrotal swelling 09/17/2018   Urinary hesitancy    Constipation    Acute on chronic combined systolic and diastolic CHF (congestive heart failure) (HCC) 08/25/2018   Acute respiratory failure with hypoxia (HCC) 08/25/2018   Acute on chronic combined systolic and diastolic congestive heart failure (HCC)  08/18/2018   Tobacco use 08/18/2018   Acute on chronic combined systolic (congestive) and diastolic (congestive) heart failure (HCC) 08/08/2018   Homelessness 08/08/2018   Smoker 08/08/2018   Acute exacerbation of CHF (congestive heart failure) (HCC) 03/28/2018   Prostate enlargement 03/16/2018   Aortic atherosclerosis (HCC) 03/16/2018   Aneurysm of abdominal aorta (HCC) 03/16/2018   Chronic foot pain    Schizoaffective disorder, bipolar type (HCC) 09/30/2016   Substance induced mood disorder (HCC) 03/13/2015   Acute kidney failure (HCC) 01/26/2015   Schizophrenia, paranoid type (HCC) 01/17/2015   Suicidal ideation    Drug hallucinosis (HCC) 10/08/2014   Chronic paranoid schizophrenia (HCC) 09/07/2014   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 Authorizing Provider  Insulin Glargine (LANTUS) 100 UNIT/ML Solostar Pen Inject 14 Units into the skin daily. 09/18/18  Yes Meredeth IdeLama, Gagan S, MD  ARIPiprazole (ABILIFY) 10 MG tablet Take 1 tablet (10 mg total) by mouth daily. 09/18/18   Meredeth Ide, MD  aspirin EC 81 MG tablet Take 1 tablet (81 mg total) by mouth daily for 30 days. 09/18/18 10/18/18  Meredeth Ide, MD  atorvastatin (LIPITOR) 40 MG tablet Take 1 tablet (40 mg total) by mouth daily. 09/18/18   Meredeth Ide, MD  bisacodyl (DULCOLAX) 5 MG EC tablet Take 1 tablet (5 mg total) by mouth daily as needed for moderate constipation. 09/04/18   Rodolph Bong, MD  carbamazepine (TEGRETOL) 200 MG tablet Take 0.5 tablets (100  mg total) by mouth 2 (two) times daily. 09/18/18   Meredeth Ide, MD  furosemide (LASIX) 40 MG tablet Take 1 tablet (40 mg total) by mouth daily. 09/24/18   Derwood Kaplan, MD  hydrocortisone (PROCTO-PAK) 1 % CREA Apply twice a day to hemorrhoids. 09/29/18   Renne Crigler, PA-C  Insulin Pen Needle 29G X MISC Use with insulin 09/18/18   Meredeth Ide, MD  lisinopril (PRINIVIL,ZESTRIL) 10 MG tablet Take 1 tablet (10 mg total) by mouth daily. 09/18/18   Meredeth Ide, MD  nicotine (NICODERM CQ - DOSED IN MG/24 HOURS) 21 mg/24hr patch Place 1 patch (21 mg total) onto the skin daily. 09/04/18   Rodolph Bong, MD  polyethylene glycol [facility] / Ethelene Hal) packet Take 17 g by mouth 2 (two) times daily. 09/18/18   Meredeth Ide, MD  Potassium Chloride ER 20 MEQ TBCR Take 20 mEq by mouth daily. 09/18/18   Meredeth Ide, MD  senna-docusate (SENOKOT-S) 8.6-50 MG tablet Take 1 tablet by mouth 2 (two) times daily. 09/04/18   Rodolph Bong, MD  tamsulosin [facility]) 0.4 MG CAPS capsule Take 1 capsule (0.4 mg total) by mouth daily after breakfast. 10/05/18   Narda Bonds, MD    Family History Family History  Problem Relation Age of Onset   Hypertension Other    Diabetes Other     Social History Social History   Tobacco Use   Smoking status: Current Every Day Smoker    Packs/day: 1.00    Years: 20.00    Pack years: 20.00    Types: Cigarettes   Smokeless tobacco: Current User  Substance Use Topics   Alcohol use: Yes    Comment: Daily Drinker    Drug use: Yes    Frequency: 7.0 times per week    Types: "Crack" cocaine, Cocaine, Marijuana     Allergies   Haldol [haloperidol]   Review of Systems Review of Systems  Gastrointestinal: Positive for vomiting.  All other systems reviewed and are negative.    Physical Exam Updated Vital Signs BP (!) 163/106    Pulse (!) 102    Temp 97.8 F (36.6 C)    Resp (!) 24    Ht 1.753 m ( )    Wt 91 kg    SpO2 95%    BMI 29.63 kg/m    Physical Exam Vitals signs and nursing note reviewed.  Constitutional:      General: He is not in acute distress.    Appearance: He is well-developed.  HENT:     Head: Normocephalic and atraumatic.     Right Ear: External ear normal.     Left Ear: External ear normal.  Eyes:     General: No scleral icterus.       Right eye: No discharge.        Left  eye: No discharge.     Conjunctiva/sclera: Conjunctivae normal.  Neck:     Musculoskeletal: Neck supple.     Trachea: No tracheal deviation.  Cardiovascular:     Rate and Rhythm: Normal rate and regular rhythm.  Pulmonary:     Effort: Pulmonary effort is normal. No respiratory distress.     Breath sounds: Normal breath sounds. No stridor. No wheezing or rales.  Abdominal:     General: Bowel sounds are normal. There is no distension.     Palpations: Abdomen is soft.     Tenderness: There is no abdominal tenderness. There is no guarding or rebound.  Genitourinary:    Comments: Scrotal and penile edema noted, no surrounding erythema; perianal region with some residual stool around the anus, no evidence of blood, no thrombosed hemorrhoid Musculoskeletal:        General: No tenderness.     Right lower leg: Edema present.     Left lower leg: Edema present.     Comments: Mild to moderate edema bilateral lower extremities,  Skin:    General: Skin is warm and dry.     Findings: No rash.  Neurological:     Mental Status: He is alert.     Cranial Nerves: No cranial nerve deficit (no facial droop, extraocular movements intact, no slurred speech).     Sensory: No sensory deficit.     Motor: No abnormal muscle tone or seizure activity.     Coordination: Coordination normal.      ED Treatments / Results  Labs (all labs ordered are listed, but only abnormal results are displayed) Labs Reviewed  CBC - Abnormal; Notable for the following components:      Result Value   RBC 3.94 (*)    Hemoglobin 9.8 (*)    HCT 32.6 (*)    MCH 24.9  (*)    RDW 17.7 (*)    All other components within normal limits  BASIC METABOLIC PANEL - Abnormal; Notable for the following components:   Glucose, Bld 431 (*)    Calcium 8.8 (*)    All other components within normal limits    EKG None  Radiology No results found.  Procedures Procedures (including critical care time)  Medications Ordered in ED Medications  furosemide (LASIX) tablet 40 mg (40 mg Oral Given 10/06/18 0928)  insulin glargine (LANTUS) injection 14 Units (14 Units Subcutaneous Given 10/06/18 1003)  insulin aspart (novoLOG) injection 8 Units (8 Units Intravenous Given 10/06/18 0925)     Initial Impression / Assessment and Plan / ED Course  I have reviewed the triage vital signs and the nursing notes.  Pertinent labs & imaging results that were available during my care of the patient were reviewed by me and considered in my medical decision making (see chart for details).  Clinical Course as of Oct 06 1031  Tue Oct 06, 2018  1031 Labs reviewed.  Hemoglobin slightly lower than previously but no significant change.   [JK]  1031 Hyperglycemia without any evidence of acute electrolyte abnormalities   [JK]    Clinical Course User Index [JK] Linwood Dibbles, MD     Patient presented to ED for evaluation of peripheral edema.  Patient has a history of chronic issues with peripheral edema medical noncompliance.  He was noted to be hyperglycemic here but without signs of acute complication.  Patient was given his insulin which he had not taken today.  Patient was also given a dose of Lasix.  He does  have persistent edema in his legs and perineum but no signs of anasarca or pulmonary edema based on my exam.  Patient appears stable to continue his outpatient regimen with close to follow-up with his primary doctor.  Final Clinical Impressions(s) / ED Diagnoses   Final diagnoses:  Peripheral edema    ED Discharge Orders    None       Linwood Dibbles, MD 10/06/18 1035

## 2018-10-06 NOTE — ED Triage Notes (Signed)
Pt BIB [facility] from home, pt complaining of worsening of edema in his legs and groin area that has gotten worse this morning. CBG 565 w/ [facility].

## 2018-10-06 NOTE — Discharge Instructions (Signed)
You were given additional doses of your Lasix and insulin.  Make sure to take your medications regularly.  Follow-up with your primary care doctor.

## 2018-10-07 ENCOUNTER — Emergency Department (HOSPITAL_COMMUNITY)
Admission: EM | Admit: 2018-10-07 | Discharge: 2018-10-07 | Disposition: A | Discharge status: Home / Self Care | Payer: Medicaid Other | Attending: Emergency Medicine | Admitting: Emergency Medicine

## 2018-10-07 ENCOUNTER — Other Ambulatory Visit: Payer: Self-pay

## 2018-10-07 DIAGNOSIS — Z590 Homelessness: Secondary | ICD-10-CM | POA: Insufficient documentation

## 2018-10-07 DIAGNOSIS — Z7982 Long term (current) use of aspirin: Secondary | ICD-10-CM | POA: Insufficient documentation

## 2018-10-07 DIAGNOSIS — I110 Hypertensive heart disease with heart failure: Secondary | ICD-10-CM | POA: Insufficient documentation

## 2018-10-07 DIAGNOSIS — R609 Edema, unspecified: Secondary | ICD-10-CM

## 2018-10-07 DIAGNOSIS — Z79899 Other long term (current) drug therapy: Secondary | ICD-10-CM | POA: Insufficient documentation

## 2018-10-07 DIAGNOSIS — I5042 Chronic combined systolic (congestive) and diastolic (congestive) heart failure: Secondary | ICD-10-CM | POA: Diagnosis not present

## 2018-10-07 DIAGNOSIS — R600 Localized edema: Secondary | ICD-10-CM | POA: Insufficient documentation

## 2018-10-07 DIAGNOSIS — F200 Paranoid schizophrenia: Secondary | ICD-10-CM | POA: Diagnosis not present

## 2018-10-07 DIAGNOSIS — R2240 Localized swelling, mass and lump, unspecified lower limb: Secondary | ICD-10-CM | POA: Diagnosis present

## 2018-10-07 DIAGNOSIS — R45851 Suicidal ideations: Secondary | ICD-10-CM | POA: Insufficient documentation

## 2018-10-07 DIAGNOSIS — Z794 Long term (current) use of insulin: Secondary | ICD-10-CM | POA: Diagnosis not present

## 2018-10-07 DIAGNOSIS — F17210 Nicotine dependence, cigarettes, uncomplicated: Secondary | ICD-10-CM | POA: Diagnosis not present

## 2018-10-07 DIAGNOSIS — E119 Type 2 diabetes mellitus without complications: Secondary | ICD-10-CM | POA: Diagnosis not present

## 2018-10-07 MED ORDER — ACETAMINOPHEN 325 MG PO TABS
650.0000 mg | ORAL_TABLET | Freq: Once | ORAL | Status: AC
  Administered 2018-10-07: 650 mg via ORAL
  Filled 2018-10-07: qty 2

## 2018-10-07 MED ORDER — FUROSEMIDE 40 MG PO TABS
40.0000 mg | ORAL_TABLET | Freq: Once | ORAL | Status: AC
  Administered 2018-10-07: 40 mg via ORAL
  Filled 2018-10-07 (×2): qty 1

## 2018-10-07 NOTE — Discharge Instructions (Addendum)
Please follow-up with a primary care provider on this matter.

## 2018-10-07 NOTE — ED Provider Notes (Signed)
[facility]-EMERGENCY DEPT Provider Note   CSN: [id number] Arrival date & time: 10/07/18  0435    History   Chief Complaint Chief Complaint  Patient presents with   Groin Swelling   Suicidal    HPI ZAK GONDEK is a 58 y.o. male.     HPI   ARTIN MCEUEN is a 58 y.o. male, with a history of DM, homelessness, HTN, cocaine abuse, CHF, and schizophrenia, presenting to the ED with lower extremity and groin edema, but states he does not know how long this is been present.  He states he thinks he has his necessary medications, including Lasix, and they are located in his cousin's residence. Patient has been seen multiple times in the ED for the same complaint.  In fact, he has been seen in the ED 43 times in the last 6 months. Triage note mentions suicidal ideations, however, patient makes no mention of this to me during my interview and in fact when I asked him about it he stated, "no, I was saying I am dying because of all the swelling."  Denies fever/chills, chest pain, shortness of breath, abdominal pain, abdominal distention, N/V/D, difficulty urinating, dysuria, or any other complaints.   Past Medical History:  Diagnosis Date   Chronic foot pain    Cocaine abuse (HCC)    Diabetes mellitus without complication (HCC)    Hepatitis C    unsure    Homelessness    Hypertension    Neuropathy    Polysubstance abuse (HCC)    Schizophrenia (HCC)    Sleep apnea    Systolic and diastolic CHF, chronic (HCC)     Patient Active Problem List   Diagnosis Date Noted   Adjustment disorder with mixed disturbance of emotions and conduct    Sleep apnea 10/03/2018   Hypokalemia 09/17/2018   Scrotal swelling 09/17/2018   Urinary hesitancy    Constipation    Acute on chronic combined systolic and diastolic CHF (congestive heart failure) (HCC) 08/25/2018   Acute respiratory failure with hypoxia (HCC) 08/25/2018   Acute on chronic  combined systolic and diastolic congestive heart failure (HCC) 08/18/2018   Tobacco use 08/18/2018   Acute on chronic combined systolic (congestive) and diastolic (congestive) heart failure (HCC) 08/08/2018   Homelessness 08/08/2018   Smoker 08/08/2018   Acute exacerbation of CHF (congestive heart failure) (HCC) 03/28/2018   Prostate enlargement 03/16/2018   Aortic atherosclerosis (HCC) 03/16/2018   Aneurysm of abdominal aorta (HCC) 03/16/2018   Chronic foot pain    Schizoaffective disorder, bipolar type (HCC) 09/30/2016   Substance induced mood disorder (HCC) 03/13/2015   Acute kidney failure (HCC) 01/26/2015   Schizophrenia, paranoid type (HCC) 01/17/2015   Suicidal ideation    Drug hallucinosis (HCC) 10/08/2014   Chronic paranoid schizophrenia (HCC) 09/07/2014   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 Authorizing Provider  ARIPiprazole (ABILIFY) 10 MG tablet Take 1 tablet (10 mg total) by mouth daily. 09/18/18   Meredeth Ide, MD  aspirin EC 81 MG tablet  Take 1 tablet (81 mg total) by mouth daily for 30 days. 09/18/18 10/18/18  Meredeth Ide, MD  atorvastatin (LIPITOR) 40 MG tablet Take 1 tablet (40 mg total) by mouth daily. 09/18/18   Meredeth Ide, MD  bisacodyl (DULCOLAX) 5 MG EC tablet Take 1 tablet (5 mg total) by mouth daily as needed for moderate constipation. 09/04/18   Rodolph Bong, MD  carbamazepine (TEGRETOL) 200 MG tablet Take 0.5 tablets (100 mg total) by mouth 2 (two) times daily. 09/18/18   Meredeth Ide, MD  furosemide (LASIX) 40 MG tablet Take 1 tablet (40 mg total) by mouth daily. 09/24/18   Derwood Kaplan, MD  hydrocortisone (PROCTO-PAK) 1 % CREA Apply twice a day to hemorrhoids. 09/29/18   Renne Crigler, PA-C  Insulin Glargine (LANTUS) 100 UNIT/ML Solostar Pen Inject 14 Units into the skin daily. 09/18/18   Meredeth Ide, MD  Insulin Pen Needle 29G X MISC Use with insulin 09/18/18   Meredeth Ide, MD  lisinopril (PRINIVIL,ZESTRIL) 10 MG tablet Take 1 tablet (10 mg total) by mouth daily. 09/18/18   Meredeth Ide, MD  nicotine (NICODERM CQ - DOSED IN MG/24 HOURS) 21 mg/24hr patch Place 1 patch (21 mg total) onto the skin daily. 09/04/18   Rodolph Bong, MD  polyethylene glycol [facility] / Ethelene Hal) packet Take 17 g by mouth 2 (two) times daily. 09/18/18   Meredeth Ide, MD  Potassium Chloride ER 20 MEQ TBCR Take 20 mEq by mouth daily. 09/18/18   Meredeth Ide, MD  senna-docusate (SENOKOT-S) 8.6-50 MG tablet Take 1 tablet by mouth 2 (two) times daily. 09/04/18   Rodolph Bong, MD  tamsulosin [facility]) 0.4 MG CAPS capsule Take 1 capsule (0.4 mg total) by mouth daily after breakfast. 10/05/18   Narda Bonds, MD    Family History Family History  Problem Relation Age of Onset   Hypertension Other    Diabetes Other     Social History Social History   Tobacco Use   Smoking status: Current Every Day Smoker    Packs/day: 1.00    Years: 20.00    Pack years: 20.00    Types: Cigarettes   Smokeless tobacco: Current User  Substance Use Topics   Alcohol use: Yes    Comment: Daily Drinker    Drug use: Yes    Frequency: 7.0 times per week    Types: "Crack" cocaine, Cocaine, Marijuana     Allergies   Haldol [haloperidol]   Review of Systems Review of Systems  Constitutional: Negative for chills, diaphoresis and fever.  Respiratory: Negative for cough and shortness of breath.   Cardiovascular: Positive for leg swelling. Negative for chest pain.  Gastrointestinal:  Negative for abdominal distention, abdominal pain, diarrhea, nausea and vomiting.  Genitourinary: Positive for scrotal swelling. Negative for difficulty urinating, discharge, dysuria and hematuria.  Neurological: Negative for weakness.  All other systems reviewed and are negative.    Physical Exam Updated Vital Signs BP (!) 168/105 (BP Location: Left Arm)    Pulse 98    Temp 98.1 F (36.7 C) (Oral)    Resp 15    SpO2 98%   Physical Exam Vitals signs and nursing note reviewed.  Constitutional:      General: He is not in acute distress.    Appearance: He is well-developed. He is not diaphoretic.  HENT:     Head: Normocephalic and atraumatic.     Mouth/Throat:     Mouth:  Mucous membranes are moist.     Pharynx: Oropharynx is clear.  Eyes:     Conjunctiva/sclera: Conjunctivae normal.  Neck:     Musculoskeletal: Neck supple.  Cardiovascular:     Rate and Rhythm: Normal rate and regular rhythm.     Pulses: Normal pulses.          Posterior tibial pulses are 2+ on the right side and 2+ on the left side.     Heart sounds: Normal heart sounds.     Comments: Tactile temperature in the extremities appropriate and equal bilaterally. Pulmonary:     Effort: Pulmonary effort is normal. No respiratory distress.     Breath sounds: Normal breath sounds.     Comments: No increased work of breathing.  Speaks in full sentences without difficulty. Abdominal:     General: There is no distension.     Palpations: Abdomen is soft.     Tenderness: There is no abdominal tenderness. There is no guarding.  Genitourinary:    Penis: No erythema or tenderness.      Scrotum/Testes:        Right: Tenderness not present.        Left: Tenderness not present.     Comments: Scrotal and penile edema without erythema or signs of infection. Musculoskeletal:     Right lower leg: Edema present.     Left lower leg: Edema present.     Comments: Pitting edema in the bilateral lower legs.  No erythema, increased  warmth, or significant tenderness.  Lymphadenopathy:     Cervical: No cervical adenopathy.  Skin:    General: Skin is warm and dry.  Neurological:     Mental Status: He is alert.     Comments: Sensation to light touch grossly intact in the bilateral lower extremities. Strength 5/5 in the lower extremities.  Psychiatric:        Mood and Affect: Mood and affect normal.        Speech: Speech normal.        Behavior: Behavior normal.      ED Treatments / Results  Labs (all labs ordered are listed, but only abnormal results are displayed) Labs Reviewed - No data to display  EKG None  Radiology No results found.  Procedures Procedures (including critical care time)  Medications Ordered in ED Medications  furosemide (LASIX) tablet 40 mg (40 mg Oral Given 10/07/18 0519)  acetaminophen (TYLENOL) tablet 650 mg (650 mg Oral Given 10/07/18 [id number])     Initial Impression / Assessment and Plan / ED Course  I have reviewed the triage vital signs and the nursing notes.  Pertinent labs & imaging results that were available during my care of the patient were reviewed by me and considered in my medical decision making (see chart for details).        Patient presents with lower extremity and groin edema.  No abdominal swelling noted.  No evidence of pulmonary edema on exam.  Patient advised to follow-up with a PCP on this matter.  He was given a dose of his Lasix.  Findings and plan of care discussed with Drema PryPedro Cardama, MD.   Vitals:   10/07/18 0438 10/07/18 0614  BP: (!) 168/105 (!) 152/93  Pulse: 98 99  Resp: 15 16  Temp: 98.1 F (36.7 C)   TempSrc: Oral   SpO2: 98% 99%     Final Clinical Impressions(s) / ED Diagnoses   Final diagnoses:  Peripheral edema    ED Discharge  Orders    None       Concepcion Living 10/07/18 4098    Nira Conn, MD 10/07/18 [phone]

## 2018-10-07 NOTE — ED Notes (Signed)
Bed: DX41 Expected date:  Expected time:  Means of arrival:  Comments: 74M Scrotal swelling

## 2018-10-07 NOTE — ED Triage Notes (Signed)
Pt reports scrotal swelling that has been present for "awhile". Non-complaint with medications. Endorses SI

## 2018-10-08 ENCOUNTER — Other Ambulatory Visit: Payer: Self-pay

## 2018-10-08 ENCOUNTER — Encounter (HOSPITAL_COMMUNITY): Payer: Self-pay

## 2018-10-08 ENCOUNTER — Emergency Department (HOSPITAL_COMMUNITY)
Admission: EM | Admit: 2018-10-08 | Discharge: 2018-10-08 | Disposition: A | Discharge status: Home / Self Care | Payer: Medicaid Other | Attending: Emergency Medicine | Admitting: Emergency Medicine

## 2018-10-08 DIAGNOSIS — E119 Type 2 diabetes mellitus without complications: Secondary | ICD-10-CM | POA: Diagnosis not present

## 2018-10-08 DIAGNOSIS — R609 Edema, unspecified: Secondary | ICD-10-CM | POA: Insufficient documentation

## 2018-10-08 DIAGNOSIS — I110 Hypertensive heart disease with heart failure: Secondary | ICD-10-CM | POA: Diagnosis not present

## 2018-10-08 DIAGNOSIS — I5043 Acute on chronic combined systolic (congestive) and diastolic (congestive) heart failure: Secondary | ICD-10-CM | POA: Insufficient documentation

## 2018-10-08 DIAGNOSIS — F17210 Nicotine dependence, cigarettes, uncomplicated: Secondary | ICD-10-CM | POA: Diagnosis not present

## 2018-10-08 DIAGNOSIS — Z79899 Other long term (current) drug therapy: Secondary | ICD-10-CM | POA: Diagnosis not present

## 2018-10-08 MED ORDER — ACETAMINOPHEN 500 MG PO TABS
1000.0000 mg | ORAL_TABLET | Freq: Once | ORAL | Status: AC
  Administered 2018-10-08: 1000 mg via ORAL
  Filled 2018-10-08: qty 2

## 2018-10-08 MED ORDER — FUROSEMIDE 20 MG PO TABS
80.0000 mg | ORAL_TABLET | Freq: Once | ORAL | Status: AC
  Administered 2018-10-08: 02:00:00 80 mg via ORAL
  Filled 2018-10-08: qty 4

## 2018-10-08 NOTE — BH Assessment (Signed)
[facility] Assessment Progress Note   Nurse Traypaiel [place] said patient could be seen by TTS in 20 minutes.

## 2018-10-08 NOTE — ED Notes (Signed)
Now patient states that he is SI without a plan due to pt not liking response of MD in reference to his chief complaint.

## 2018-10-08 NOTE — ED Notes (Signed)
Pt receiving TTS assessment at this time.

## 2018-10-08 NOTE — ED Notes (Signed)
Reviewed d/c instructions with pt, who verbalized understanding and had no outstanding questions. Pt armband & labels removed and placed in shred bin. Unable to obtain signature d/t pt refusal. Pt departed in NAD, refused use of wheelchair.

## 2018-10-08 NOTE — ED Provider Notes (Signed)
Emergency Department Provider Note   I have reviewed the triage vital signs and the nursing notes.   HISTORY  Chief Complaint Leg Swelling (groin swelling)   HPI [name] is a 58 y.o. male who presents with his normal chief complaint of scrotal and lower extremity edema.  Patient has been here 44 times in the last 6 months almost always with the same complaints.  Has not taken his medicine since being here 24 hours ago.  Also is having pain.  Patient also states he suicidal at this time.  Does not have a plan at this time.  [facility] help get some medical attention.  Patient gives me a list of demands while he is here that he wants "graham crackers, orange juice, milk, sandwich shower, new scrubs, social work consult and a psychiatric consult".  No other complaints.   No other associated or modifying symptoms.    Past Medical History:  Diagnosis Date  . Chronic foot pain   . Cocaine abuse (HCC)   . Diabetes mellitus without complication (HCC)   . Hepatitis C    unsure   . Homelessness   . Hypertension   . Neuropathy   . Polysubstance abuse (HCC)   . Schizophrenia (HCC)   . Sleep apnea   . Systolic and diastolic CHF, chronic [facility](HCC)     Patient Active Problem List   Diagnosis Date Noted  . Adjustment disorder with mixed disturbance of emotions and conduct   . Sleep apnea 10/03/2018  . Hypokalemia 09/17/2018  . Scrotal swelling 09/17/2018  . Urinary hesitancy   . Constipation   . Acute on chronic combined systolic and diastolic CHF (congestive heart failure) (HCC) 08/25/2018  . Acute respiratory failure with hypoxia (HCC) 08/25/2018  . Acute on chronic combined systolic and diastolic congestive heart failure (HCC) 08/18/2018  . Tobacco use 08/18/2018  . 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 Homelessness 08/08/2018  . Smoker 08/08/2018  .  Acute exacerbation of CHF (congestive heart failure) (HCC) 03/28/2018  . Prostate enlargement 03/16/2018  . Aortic atherosclerosis (HCC) 03/16/2018  . 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 Chronic foot pain   . Schizoaffective disorder, bipolar type (HCC) 09/30/2016  . Substance induced mood disorder (HCC) 03/13/2015  . Acute kidney failure (HCC) 01/26/2015  . Schizophrenia, paranoid type (HCC) 01/17/2015  . Suicidal ideation   . Drug hallucinosis (HCC) 10/08/2014  . Chronic paranoid schizophrenia (HCC) 09/07/2014  . 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 Urinary retention   . Cocaine use disorder, severe, dependence (HCC)   . Essential hypertension, benign 03/28/2013  . 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 MULTIPLE TOOTH EXTRACTIONS      Current Outpatient Rx  . Order #: [id number] Class: Normal  . Order #: [id number] Class: Normal  . Order #: [id number] Class: Normal  . Order #: [id number] Class: Print  . Order #: [id number] Class: Normal  . Order #: [id number] Class: Print  . Order #: [id number] Class: Print  . Order #: [id number] Class: Normal  . Order #: [id number] Class: Normal  . Order #: [id number] Class: Normal  . Order #: [id number] Class: Print  . Order #: [id number] Class: Normal  . Order #: [id number] Class: Normal  . Order #: [id number] Class: OTC  . Order #: [id number] Class: Normal  Allergies Haldol [haloperidol]  Family History  Problem Relation Age of Onset  . Hypertension Other   . Diabetes Other     Social History Social History   Tobacco Use  . Smoking status: Current Every Day Smoker    Packs/day: 1.00    Years: 20.00    Pack years: 20.00    Types: Cigarettes  . Smokeless tobacco: Current User  Substance Use Topics  . Alcohol use: Yes    Comment: Daily Drinker   . Drug use: Yes     Frequency: 7.0 times per week    Types: "Crack" cocaine, Cocaine, Marijuana    Review of Systems  All other systems negative except as documented in the HPI. All pertinent positives and negatives as reviewed in the HPI. ____________________________________________   PHYSICAL EXAM:  VITAL SIGNS: ED Triage Vitals  Enc Vitals Group     BP 10/08/18 0135 (!) 163/108     Pulse Rate 10/08/18 0135 99     Resp 10/08/18 0135 20     Temp 10/08/18 0135 98.5 F (36.9 C)     Temp Source 10/08/18 0135 Oral     SpO2 10/08/18 0135 97 %    Constitutional: Alert and oriented. Well appearing and in no acute distress. Eyes: Conjunctivae are normal. PERRL. EOMI. GU: scrotal edema without e/o fourniers Head: Atraumatic. Nose: No congestion/rhinnorhea. Mouth/Throat: Mucous membranes are moist.  Oropharynx non-erythematous. Neck: No stridor.  No meningeal signs.   Cardiovascular: Normal rate, regular rhythm. Good peripheral circulation. Grossly normal heart sounds.   Respiratory: Normal respiratory effort.  No retractions. Lungs CTAB. Gastrointestinal: Soft and nontender. No distention.  Musculoskeletal: No lower extremity tenderness has bilateral edema to the shins. No gross deformities of extremities. Neurologic:  Normal speech and language. No gross focal neurologic deficits are appreciated.  Skin:  Skin is warm, dry and intact. No rash noted.   ____________________________________________   INITIAL IMPRESSION / ASSESSMENT AND PLAN / ED COURSE  Cleared by psych, medically cleared with his chief complaint evidence of emergent changes in his chronic conditions.  Encouraged outpatient follow-up.     Pertinent labs & imaging results that were available during my care of the patient were reviewed by me and considered in my medical decision making (see chart for details).   A medical screening exam was performed and I feel the patient has had an appropriate workup for their chief complaint at  this time and likelihood of emergent condition existing is low. They have been counseled on decision, discharge, follow up and which symptoms necessitate immediate return to the emergency department. They or their family verbally stated understanding and agreement with plan and discharged in stable condition.   ____________________________________________  FINAL CLINICAL IMPRESSION(S) / ED DIAGNOSES  Final diagnoses:  Peripheral edema     MEDICATIONS GIVEN DURING THIS VISIT:  Medications  furosemide (LASIX) tablet 80 mg (80 mg Oral Given 10/08/18 0149)  acetaminophen (TYLENOL) tablet 1,000 mg (1,000 mg Oral Given 10/08/18 0149)     NEW OUTPATIENT MEDICATIONS STARTED DURING THIS VISIT:  Discharge Medication List as of 10/08/2018  6:03 AM      Note:  This note was prepared with assistance of Dragon voice recognition software. Occasional wrong-word or sound-a-like substitutions may have occurred due to the inherent limitations of voice recognition software.   Elian Gloster, Barbara Cower, MD 10/08/18 [id number]
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
## 2018-10-09 ENCOUNTER — Inpatient Hospital Stay (HOSPITAL_COMMUNITY)
Admission: EM | Admit: 2018-10-09 | Discharge: 2018-10-09 | DRG: 292 | Discharge status: Against Medical Advice | Payer: Medicare Other | Attending: Internal Medicine | Admitting: Internal Medicine

## 2018-10-09 ENCOUNTER — Emergency Department (HOSPITAL_COMMUNITY): Payer: Medicare Other

## 2018-10-09 ENCOUNTER — Inpatient Hospital Stay (HOSPITAL_COMMUNITY)
Admission: EM | Admit: 2018-10-09 | Discharge: 2018-10-10 | Discharge status: Against Medical Advice | Payer: Medicare Other | Source: Home / Self Care | Attending: Internal Medicine | Admitting: Internal Medicine

## 2018-10-09 ENCOUNTER — Encounter (HOSPITAL_COMMUNITY): Payer: Self-pay | Admitting: Emergency Medicine

## 2018-10-09 ENCOUNTER — Other Ambulatory Visit: Payer: Self-pay

## 2018-10-09 DIAGNOSIS — Z5329 Procedure and treatment not carried out because of patient's decision for other reasons: Secondary | ICD-10-CM | POA: Diagnosis not present

## 2018-10-09 DIAGNOSIS — G4733 Obstructive sleep apnea (adult) (pediatric): Secondary | ICD-10-CM | POA: Diagnosis present

## 2018-10-09 DIAGNOSIS — F17210 Nicotine dependence, cigarettes, uncomplicated: Secondary | ICD-10-CM | POA: Diagnosis present

## 2018-10-09 DIAGNOSIS — I5043 Acute on chronic combined systolic (congestive) and diastolic (congestive) heart failure: Secondary | ICD-10-CM

## 2018-10-09 DIAGNOSIS — Z794 Long term (current) use of insulin: Secondary | ICD-10-CM | POA: Diagnosis not present

## 2018-10-09 DIAGNOSIS — Z833 Family history of diabetes mellitus: Secondary | ICD-10-CM | POA: Diagnosis not present

## 2018-10-09 DIAGNOSIS — B1920 Unspecified viral hepatitis C without hepatic coma: Secondary | ICD-10-CM | POA: Diagnosis present

## 2018-10-09 DIAGNOSIS — E785 Hyperlipidemia, unspecified: Secondary | ICD-10-CM | POA: Diagnosis present

## 2018-10-09 DIAGNOSIS — Z9119 Patient's noncompliance with other medical treatment and regimen: Secondary | ICD-10-CM | POA: Diagnosis not present

## 2018-10-09 DIAGNOSIS — D649 Anemia, unspecified: Secondary | ICD-10-CM | POA: Diagnosis present

## 2018-10-09 DIAGNOSIS — G8929 Other chronic pain: Secondary | ICD-10-CM | POA: Diagnosis present

## 2018-10-09 DIAGNOSIS — Z7982 Long term (current) use of aspirin: Secondary | ICD-10-CM | POA: Diagnosis not present

## 2018-10-09 DIAGNOSIS — R601 Generalized edema: Secondary | ICD-10-CM | POA: Diagnosis not present

## 2018-10-09 DIAGNOSIS — I110 Hypertensive heart disease with heart failure: Principal | ICD-10-CM | POA: Diagnosis present

## 2018-10-09 DIAGNOSIS — I10 Essential (primary) hypertension: Secondary | ICD-10-CM | POA: Diagnosis present

## 2018-10-09 DIAGNOSIS — E1165 Type 2 diabetes mellitus with hyperglycemia: Secondary | ICD-10-CM | POA: Diagnosis present

## 2018-10-09 DIAGNOSIS — Z9114 Patient's other noncompliance with medication regimen: Secondary | ICD-10-CM | POA: Diagnosis not present

## 2018-10-09 DIAGNOSIS — F1420 Cocaine dependence, uncomplicated: Secondary | ICD-10-CM | POA: Diagnosis present

## 2018-10-09 DIAGNOSIS — F200 Paranoid schizophrenia: Secondary | ICD-10-CM | POA: Diagnosis present

## 2018-10-09 DIAGNOSIS — Z590 Homelessness: Secondary | ICD-10-CM | POA: Diagnosis not present

## 2018-10-09 DIAGNOSIS — E1140 Type 2 diabetes mellitus with diabetic neuropathy, unspecified: Secondary | ICD-10-CM | POA: Diagnosis present

## 2018-10-09 DIAGNOSIS — N400 Enlarged prostate without lower urinary tract symptoms: Secondary | ICD-10-CM | POA: Diagnosis present

## 2018-10-09 DIAGNOSIS — Z79899 Other long term (current) drug therapy: Secondary | ICD-10-CM

## 2018-10-09 DIAGNOSIS — M79673 Pain in unspecified foot: Secondary | ICD-10-CM | POA: Diagnosis present

## 2018-10-09 DIAGNOSIS — Z8249 Family history of ischemic heart disease and other diseases of the circulatory system: Secondary | ICD-10-CM

## 2018-10-09 DIAGNOSIS — I50810 Right heart failure, unspecified: Secondary | ICD-10-CM | POA: Diagnosis present

## 2018-10-09 DIAGNOSIS — E119 Type 2 diabetes mellitus without complications: Secondary | ICD-10-CM

## 2018-10-09 DIAGNOSIS — I509 Heart failure, unspecified: Secondary | ICD-10-CM

## 2018-10-09 LAB — GLUCOSE, CAPILLARY
Glucose-Capillary: 405 mg/dL — ABNORMAL HIGH (ref 70–99)
Glucose-Capillary: 199 mg/dL — ABNORMAL HIGH (ref 70–99)
Glucose-Capillary: 34 mg/dL — CL (ref 70–99)
Glucose-Capillary: 88 mg/dL (ref 70–99)

## 2018-10-09 LAB — COMPREHENSIVE METABOLIC PANEL
ALT: 23 U/L (ref 0–44)
AST: 37 U/L (ref 15–41)
Albumin: 2.9 g/dL — ABNORMAL LOW (ref 3.5–5.0)
Alkaline Phosphatase: 125 U/L (ref 38–126)
Anion gap: 8 (ref 5–15)
BUN: 22 mg/dL — ABNORMAL HIGH (ref 6–20)
CO2: 29 mmol/L (ref 22–32)
Calcium: 9.1 mg/dL (ref 8.9–10.3)
Chloride: 98 mmol/L (ref 98–111)
Creatinine, Ser: 1.11 mg/dL (ref 0.61–1.24)
GFR calc Af Amer: >60 mL/min (ref >60)
GFR calc non Af Amer: >60 mL/min (ref >60)
Glucose, Bld: 383 mg/dL — ABNORMAL HIGH (ref 70–99)
Potassium: 3.8 mmol/L (ref 3.5–5.1)
Sodium: 135 mmol/L (ref 135–145)
Total Bilirubin: 0.4 mg/dL (ref 0.3–1.2)
Total Protein: 7.4 g/dL (ref 6.5–8.1)

## 2018-10-09 LAB — CBC
HCT: 35.1 % — ABNORMAL LOW (ref 39.0–52.0)
HCT: 32.4 % — ABNORMAL LOW (ref 39.0–52.0)
Hemoglobin: 10.4 g/dL — ABNORMAL LOW (ref 13.0–17.0)
Hemoglobin: 9.7 g/dL — ABNORMAL LOW (ref 13.0–17.0)
MCH: 24.6 pg — ABNORMAL LOW (ref 26.0–34.0)
MCH: 24.3 pg — ABNORMAL LOW (ref 26.0–34.0)
MCHC: 29.6 g/dL — ABNORMAL LOW (ref 30.0–36.0)
MCHC: 29.9 g/dL — ABNORMAL LOW (ref 30.0–36.0)
MCV: 83 fL (ref 80.0–100.0)
MCV: 81.2 fL (ref 80.0–100.0)
Platelets: 335 10*3/uL (ref 150–400)
Platelets: 328 10*3/uL (ref 150–400)
RBC: 4.23 MIL/uL (ref 4.22–5.81)
RBC: 3.99 MIL/uL — ABNORMAL LOW (ref 4.22–5.81)
RDW: 18.2 % — ABNORMAL HIGH (ref 11.5–15.5)
RDW: 17.9 % — ABNORMAL HIGH (ref 11.5–15.5)
WBC: 9.7 10*3/uL (ref 4.0–10.5)
WBC: 10.4 10*3/uL (ref 4.0–10.5)
nRBC: 0 % (ref 0.0–0.2)
nRBC: 0 % (ref 0.0–0.2)

## 2018-10-09 LAB — PROTIME-INR
INR: 1.2 (ref 0.8–1.2)
Prothrombin Time: 14.9 seconds (ref 11.4–15.2)

## 2018-10-09 LAB — CREATININE, SERUM
Creatinine, Ser: 1.09 mg/dL (ref 0.61–1.24)
GFR calc Af Amer: >60 mL/min (ref >60)
GFR calc non Af Amer: >60 mL/min (ref >60)

## 2018-10-09 LAB — BRAIN NATRIURETIC PEPTIDE: B Natriuretic Peptide: 1494.9 pg/mL — ABNORMAL HIGH (ref 0.0–100.0)
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
## 2018-10-09 MED ORDER — ARIPIPRAZOLE 5 MG PO TABS: 10.0000 mg | ORAL_TABLET | Freq: Every day | ORAL | Status: DC

## 2018-10-09 MED ORDER — ENOXAPARIN SODIUM 40 MG/0.4ML ~~LOC~~ SOLN
40.0000 mg | SUBCUTANEOUS | Status: DC
  Administered 2018-10-09: 40 mg via SUBCUTANEOUS
  Filled 2018-10-09: qty 0.4

## 2018-10-09 MED ORDER — ASPIRIN EC 81 MG PO TBEC
81.0000 mg | DELAYED_RELEASE_TABLET | Freq: Every day | ORAL | Status: DC
  Administered 2018-10-09: 81 mg via ORAL
  Filled 2018-10-09: qty 1

## 2018-10-09 MED ORDER — NICOTINE 21 MG/24HR TD PT24: 21.0000 mg | MEDICATED_PATCH | Freq: Every day | TRANSDERMAL | Status: DC

## 2018-10-09 MED ORDER — INSULIN ASPART 100 UNIT/ML ~~LOC~~ SOLN
0.0000 [IU] | Freq: Three times a day (TID) | SUBCUTANEOUS | Status: DC
  Administered 2018-10-09: 3 [IU] via SUBCUTANEOUS

## 2018-10-09 MED ORDER — ACETAMINOPHEN 325 MG PO TABS
650.0000 mg | ORAL_TABLET | Freq: Four times a day (QID) | ORAL | Status: DC | PRN
  Administered 2018-10-09 – 2018-10-10 (×3): 650 mg via ORAL
  Filled 2018-10-09 (×3): qty 2

## 2018-10-09 MED ORDER — NICOTINE 21 MG/24HR TD PT24
21.0000 mg | MEDICATED_PATCH | Freq: Every day | TRANSDERMAL | Status: DC
  Administered 2018-10-09: 21 mg via TRANSDERMAL
  Filled 2018-10-09: qty 1

## 2018-10-09 MED ORDER — VITAMIN B-1 100 MG PO TABS
100.0000 mg | ORAL_TABLET | Freq: Every day | ORAL | Status: DC
  Administered 2018-10-09: 100 mg via ORAL
  Filled 2018-10-09: qty 1

## 2018-10-09 MED ORDER — INSULIN ASPART 100 UNIT/ML ~~LOC~~ SOLN
10.0000 [IU] | Freq: Once | SUBCUTANEOUS | Status: AC
  Administered 2018-10-09: 10 [IU] via SUBCUTANEOUS

## 2018-10-09 MED ORDER — FUROSEMIDE 10 MG/ML IJ SOLN
80.0000 mg | Freq: Once | INTRAMUSCULAR | Status: AC
  Administered 2018-10-09: 80 mg via INTRAVENOUS
  Filled 2018-10-09: qty 8

## 2018-10-09 MED ORDER — ARIPIPRAZOLE 10 MG PO TABS
10.0000 mg | ORAL_TABLET | Freq: Every day | ORAL | Status: DC
  Administered 2018-10-09: 10 mg via ORAL
  Filled 2018-10-09: qty 2
  Filled 2018-10-09: qty 1

## 2018-10-09 MED ORDER — LISINOPRIL 10 MG PO TABS
10.0000 mg | ORAL_TABLET | Freq: Every day | ORAL | Status: DC
  Administered 2018-10-09: 10 mg via ORAL
  Filled 2018-10-09: qty 1

## 2018-10-09 MED ORDER — DEXTROSE 50 % IV SOLN
50.0000 mL | Freq: Once | INTRAVENOUS | Status: AC
  Administered 2018-10-09: 50 mL via INTRAVENOUS
  Filled 2018-10-09: qty 50

## 2018-10-09 MED ORDER — CARVEDILOL 3.125 MG PO TABS: 1.5625 mg | ORAL_TABLET | Freq: Two times a day (BID) | ORAL | Status: DC

## 2018-10-09 MED ORDER — BISACODYL 5 MG PO TBEC: 5.0000 mg | DELAYED_RELEASE_TABLET | Freq: Every day | ORAL | Status: DC | PRN

## 2018-10-09 MED ORDER — TRAMADOL HCL 50 MG PO TABS: 50.0000 mg | ORAL_TABLET | Freq: Four times a day (QID) | ORAL | Status: DC | PRN

## 2018-10-09 MED ORDER — SENNOSIDES-DOCUSATE SODIUM 8.6-50 MG PO TABS
1.0000 | ORAL_TABLET | Freq: Two times a day (BID) | ORAL | Status: DC
  Administered 2018-10-09: 1 via ORAL
  Filled 2018-10-09: qty 1

## 2018-10-09 MED ORDER — THIAMINE HCL 100 MG/ML IJ SOLN: 100.0000 mg | Freq: Every day | INTRAMUSCULAR | Status: DC

## 2018-10-09 MED ORDER — TAMSULOSIN HCL 0.4 MG PO CAPS
0.4000 mg | ORAL_CAPSULE | Freq: Every day | ORAL | Status: DC
  Administered 2018-10-09: 0.4 mg via ORAL
  Filled 2018-10-09: qty 1

## 2018-10-09 MED ORDER — INSULIN GLARGINE 100 UNIT/ML ~~LOC~~ SOLN
7.0000 [IU] | Freq: Every day | SUBCUTANEOUS | Status: DC
  Filled 2018-10-09: qty 0.07

## 2018-10-09 MED ORDER — FOLIC ACID 1 MG PO TABS
1.0000 mg | ORAL_TABLET | Freq: Every day | ORAL | Status: DC
  Administered 2018-10-09: 1 mg via ORAL
  Filled 2018-10-09: qty 1

## 2018-10-09 MED ORDER — INSULIN GLARGINE 100 UNIT/ML ~~LOC~~ SOLN
14.0000 [IU] | Freq: Every day | SUBCUTANEOUS | Status: DC
  Filled 2018-10-09 (×2): qty 0.14

## 2018-10-09 MED ORDER — FUROSEMIDE 10 MG/ML IJ SOLN
40.0000 mg | Freq: Two times a day (BID) | INTRAMUSCULAR | Status: DC
  Administered 2018-10-09: 40 mg via INTRAVENOUS
  Filled 2018-10-09: qty 4

## 2018-10-09 MED ORDER — CARBAMAZEPINE 200 MG PO TABS
100.0000 mg | ORAL_TABLET | Freq: Two times a day (BID) | ORAL | Status: DC
  Administered 2018-10-09: 100 mg via ORAL
  Filled 2018-10-09: qty 0.5

## 2018-10-09 MED ORDER — DEXTROSE 50 % IV SOLN
INTRAVENOUS | Status: AC
  Filled 2018-10-09: qty 50

## 2018-10-09 MED ORDER — INSULIN GLARGINE 100 UNIT/ML SOLOSTAR PEN: 14.0000 [IU] | PEN_INJECTOR | Freq: Every day | SUBCUTANEOUS | Status: DC

## 2018-10-09 MED ORDER — LORAZEPAM 2 MG/ML IJ SOLN: 1.0000 mg | Freq: Four times a day (QID) | INTRAMUSCULAR | Status: DC | PRN

## 2018-10-09 MED ORDER — CARBAMAZEPINE 200 MG PO TABS
100.0000 mg | ORAL_TABLET | Freq: Two times a day (BID) | ORAL | Status: DC
  Administered 2018-10-09: 100 mg via ORAL
  Filled 2018-10-09 (×2): qty 0.5

## 2018-10-09 MED ORDER — LISINOPRIL 10 MG PO TABS: 10.0000 mg | ORAL_TABLET | Freq: Every day | ORAL | Status: DC

## 2018-10-09 MED ORDER — ADULT MULTIVITAMIN W/MINERALS CH
1.0000 | ORAL_TABLET | Freq: Every day | ORAL | Status: DC
  Administered 2018-10-09: 1 via ORAL
  Filled 2018-10-09: qty 1

## 2018-10-09 MED ORDER — ATORVASTATIN CALCIUM 40 MG PO TABS: 40.0000 mg | ORAL_TABLET | Freq: Every day | ORAL | Status: DC

## 2018-10-09 MED ORDER — TAMSULOSIN HCL 0.4 MG PO CAPS: 0.4000 mg | ORAL_CAPSULE | Freq: Every day | ORAL | Status: DC

## 2018-10-09 MED ORDER — INSULIN GLARGINE 100 UNIT/ML ~~LOC~~ SOLN
14.0000 [IU] | Freq: Every day | SUBCUTANEOUS | Status: DC
  Administered 2018-10-09: 14 [IU] via SUBCUTANEOUS
  Filled 2018-10-09 (×2): qty 0.14

## 2018-10-09 MED ORDER — INSULIN ASPART 100 UNIT/ML ~~LOC~~ SOLN: 0.0000 [IU] | Freq: Every day | SUBCUTANEOUS | Status: DC

## 2018-10-09 MED ORDER — ASPIRIN EC 81 MG PO TBEC: 81.0000 mg | DELAYED_RELEASE_TABLET | Freq: Every day | ORAL | Status: DC

## 2018-10-09 MED ORDER — LORAZEPAM 1 MG PO TABS: 1.0000 mg | ORAL_TABLET | Freq: Four times a day (QID) | ORAL | Status: DC | PRN

## 2018-10-09 MED ORDER — FUROSEMIDE 10 MG/ML IJ SOLN
40.0000 mg | Freq: Two times a day (BID) | INTRAMUSCULAR | Status: DC
  Filled 2018-10-09: qty 4

## 2018-10-09 MED ORDER — BACITRACIN-NEOMYCIN-POLYMYXIN OINTMENT TUBE
TOPICAL_OINTMENT | Freq: Two times a day (BID) | CUTANEOUS | Status: DC
  Administered 2018-10-09: 09:00:00 via TOPICAL
  Filled 2018-10-09: qty 14

## 2018-10-09 MED ORDER — POLYETHYLENE GLYCOL 3350 17 G PO PACK
17.0000 g | PACK | Freq: Two times a day (BID) | ORAL | Status: DC
  Administered 2018-10-09: 17 g via ORAL
  Filled 2018-10-09: qty 1

## 2018-10-09 MED ORDER — ATORVASTATIN CALCIUM 40 MG PO TABS
40.0000 mg | ORAL_TABLET | Freq: Every day | ORAL | Status: DC
  Administered 2018-10-09: 40 mg via ORAL
  Filled 2018-10-09: qty 1

## 2018-10-09 NOTE — Progress Notes (Signed)
Patient seen and examined, chart reviewed and agree with history physical and assessment plan done this morning.  Please refer to the admission note this morning.  Patient resting comfortably on room air.  Complains of significant lower leg edema scrotal edema. Lung are Diminished bilaterally, not in acute distress, asking for pain medication for his lower leg and is scheduled pain from edema.  Briefly, 58 year old gentleman with history of combined systolic and diastolic CHF, hep C, IDDM, hypertension, chronic paranoid schizophrenia/substance use disorder/homelessness, OSA not on CPAP admitted with worsening peripheral edema.  Patient has not been compliant and not taking his medication.  Reported dry weight around 160 pounds, on admission weight of 210 pounds. Recent wt on d/c on 3/24 was 207 lb. In ER, Found to be in CHF as her ablation, chest x-ray with cardiomegaly and mild diffuse pulmonary interstitial edema.  Patient was admitted on IV Lasix.  Acute on chronic combined systolic and diastolic CHF exacerbation secondary to noncompliance/homelessness/dietary indiscretion.  Recent LVEF 25 to 30% and grade 3 diastolic dysfunction on 09/02/2018, BNP 1494, chest x-ray interstitial congestion on admission.  Continue IV Lasix 40 every 12 hours, monitor intake output, daily weight, low-salt and fluid restricted diet.  Consider beta-blocker once more euvolemic.  Continue ACE inhibitor, monitor renal function.  Poorly controlled IDDM, A1c 7.3 in January 2020.  Blood sugar poorly controlled due to noncompliance.  Lantus and sliding scale insulin resumed, monitor and adjust insulin dose.  Tobacco abuse/alcohol abuse/polysubstance abuse.  Cessation advised and discussed.  Prior UDS was positive for cocaine and patient endorses current use.  Continue on multivitamin thiamine folate and CIWA protocol for alcohol withdrawal .  Chronic paranoid schizophrenia/homelessness, resume home Abilify,Tegretol, consult SW   to help with resources and meds.  PVV:ZSMOL pressure is controlled, continue home meds and monitor.  Hyperlipidemia continue statin.  Medical nonadherence/noncompliance, discussed in length.    Patient has signed behavioral form due to prior history of agitation.

## 2018-10-09 NOTE — Discharge Summary (Signed)
Physician Discharge Summary  Mellody MemosFrederick L Mcclees ZOX:[id number] DOB: 1960/12/29 DOA: 10/09/2018  PCP: Patient, No Pcp Per  Admit date: 10/09/2018 Discharge date: 10/09/2018  Admitted From: home  Disposition: AMA  Recommendations for Outpatient Follow-up:  1. Follow up with PCP/RETURN TO ER  Home Health:none   Equipment/Devices: None  Discharge Condition: Stable CODE STATUS: Full code Diet recommendation: Cardiac and diabetic diet  Brief/Interim Summary: 58 year old gentleman with history of combined systolic and diastolic CHF, hep C, IDDM, hypertension, chronic paranoid schizophrenia/substance use disorder/homelessness, OSA not on CPAP admitted with worsening peripheral edema.  Patient has not been compliant and not taking his medication.  Reported dry weight around 160 pounds, on admission weight of 210 pounds. Recent wt on d/c on 3/24 was 207 lb. In ER, Found to be in CHF as her ablation, chest x-ray with cardiomegaly and mild diffuse pulmonary interstitial Patient was admitted on IV Lasix Patient has been managed for acute on chronic congestive heart failure due to noncompliance, hyperglycemia.  He has been diuresed.  Patient decided to leave AGAINST MEDICAL ADVICE, I was able to reach him and informed him that leaving was medical advice is not in his best interest as he has not completed treatment and was just admitted this morning.  I have explained to him that there is risk including but not limited to significant disability and death if you leaves without completing treatment to which patient verbally understanding and stated "[facility] after  leave here".  Please refer to the admission note in my prior note done earlier today.   Acute on chronic combined systolic and diastolic CHF exacerbation secondary to noncompliance/homelessness/dietary indiscretion.  Recent LVEF 25 to 30% and grade 3 diastolic dysfunction on 09/02/2018, BNP 1494, chest x-ray interstitial congestion  on admission.  Continue IV Lasix 40 every 12 hours, monitor intake output, daily weight, low-salt and fluid restricted diet.  Consider beta-blocker once more euvolemic.  Continue ACE inhibitor, monitor renal function.  Poorly controlled IDDM, A1c 7.3 in January 2020.  Blood sugar poorly controlled due to noncompliance.  Lantus and sliding scale insulin resumed, monitor and adjust insulin dose.  Tobacco abuse/alcohol abuse/polysubstance abuse.  Cessation advised and discussed.  Prior UDS was positive for cocaine and patient endorses current use.  Continue on multivitamin thiamine folate and CIWA protocol for alcohol withdrawal .  Chronic paranoid schizophrenia/homelessness, resume home Abilify,Tegretol, consult SW  to help with resources and meds.  WJX:BJYNWHTN:Blood pressure is controlled, continue home meds and monitor.  Hyperlipidemia continue statin.  Medical nonadherence/noncompliance, discussed in length.     Discharge Diagnoses:  Principal Problem:   Acute exacerbation of CHF (congestive heart failure) (HCC) Active Problems:   Insulin-requiring or dependent type II diabetes mellitus (HCC)   Essential hypertension, benign   Cocaine use disorder, severe, dependence (HCC)   Chronic paranoid schizophrenia [facility](HCC)    Discharge Instructions Patient left AGAINST MEDICAL ADVICE without taking discharge instruction. He was instructed verbally to return to ER or contact with MD if he has any significant symptoms, chest pain, nausea, vomiting.   Allergies  Allergen Reactions  . Haldol [Haloperidol] Other (See Comments)    Muscle spasms, loss of voluntary movement. However, pt has taken Thorazine on multiple occasions with no adverse effects.     Consultations:   Procedures/Studies: Dg Chest 2 View  Result Date: 10/02/2018 CLINICAL DATA:  Cough, abdominal pain EXAM: CHEST - 2 VIEW COMPARISON:  09/21/2018 FINDINGS: Lingular scarring. No focal consolidation. No pleural effusion or  pneumothorax. Cardiomegaly. Degenerative changes of the visualized thoracolumbar spine. IMPRESSION: Normal chest radiographs. Electronically Signed   By: Charline Bills M.D.   On: 10/02/2018 03:42   Dg Chest 2 View  Result Date: 09/21/2018 CLINICAL DATA:  Chest pain, groin swelling, suicidal ideation EXAM: CHEST - 2 VIEW COMPARISON:  09/20/2018 FINDINGS: Linear scarring in the lingula. Lungs otherwise clear. No pleural effusion or pneumothorax. Cardiomegaly. Mild degenerative changes of the visualized thoracolumbar spine. IMPRESSION: No evidence of acute cardiopulmonary disease. Electronically Signed   By: Charline Bills M.D.   On: 09/21/2018 19:04   Dg Chest 2 View  Result Date: 09/15/2018 CLINICAL DATA:  Chest pain EXAM: CHEST - 2 VIEW COMPARISON:  09/13/2018 FINDINGS: Cardiomegaly with vascular congestion. Possible early interstitial edema. No confluent opacities or effusions. No acute bony abnormality. IMPRESSION: Cardiomegaly with vascular congestion and possible early interstitial edema. Electronically Signed   By: Charlett Nose M.D.   On: 09/15/2018 03:04   Dg Chest 2 View  Result Date: 09/13/2018 CLINICAL DATA:  Groin swelling short of breath EXAM: CHEST - 2 VIEW COMPARISON:  09/10/2018, 09/08/2018, 09/01/2018 FINDINGS: Cardiomegaly with vascular congestion. Linear atelectasis in the left mid lung. No acute consolidation or significant pleural effusion. No pneumothorax. Aortic atherosclerosis. IMPRESSION: Cardiomegaly with mild central vascular congestion. Electronically Signed   By: [name]   On: 09/13/2018 03:19   Dg Chest 2 View  Result Date: 09/10/2018 CLINICAL DATA:  Shortness of breath EXAM: CHEST - 2 VIEW COMPARISON:  September 08, 2018 FINDINGS: There is atelectatic change in the left mid lung. There is no edema or consolidation. There is cardiomegaly with pulmonary venous hypertension. There is no appreciable adenopathy. No bone lesions. IMPRESSION: Pulmonary vascular  congestion. Left midlung atelectasis. No frank edema or consolidation. Electronically Signed   By: Bretta Bang III M.D.   On: 09/10/2018 14:17   US Scrotum  Result Date: 09/17/2018 CLINICAL DATA:  Scrotal swelling. EXAM: ULTRASOUND OF SCROTUM TECHNIQUE: Complete ultrasound examination of the testicles, epididymis, and other scrotal structures was performed. COMPARISON:  Scrotal ultrasound dated September 13, 2018. FINDINGS: Right testicle Measurements: 3.4 x 2.9 x 2.7 cm. No mass or microlithiasis visualized. Left testicle Measurements: 3.4 x 1.9 x 2.2 cm. Unchanged mild heterogeneity. No mass or microlithiasis visualized. Right epididymis:  Unchanged 2.8 cm cyst in the epididymal head. Left epididymis:  Normal in size and appearance. Hydrocele:  Unchanged small right hydrocele. Varicocele:  None visualized. Unchanged diffuse scrotal edema. IMPRESSION: 1. Relatively unchanged diffuse scrotal edema. No focal fluid collection. 2. Unchanged small right hydrocele and right epididymal head cyst. Electronically Signed   By: Obie Dredge M.D.   On: 09/17/2018 12:22   Dg Chest Port 1 View  Result Date: 10/09/2018 CLINICAL DATA:  Initial evaluation for acute scrotal pain and dental pain, edema. EXAM: PORTABLE CHEST 1 VIEW COMPARISON:  Prior radiograph from 10/03/2018 FINDINGS: Moderate cardiomegaly, stable. Mediastinal silhouette within normal limits. Aortic atherosclerosis. Lungs normally inflated. Linear atelectasis/scarring present within the left perihilar region, stable. Mild diffuse pulmonary interstitial congestion without overt pulmonary edema. No pleural effusion. No consolidative opacity. No pneumothorax. No acute osseous finding. Metallic density overlies the right upper quadrant, stable. IMPRESSION: 1. Stable cardiomegaly with mild diffuse pulmonary interstitial congestion without overt pulmonary edema. 2. Aortic atherosclerosis. Electronically Signed   By: [name]   On: 10/09/2018  03:43   Dg Chest Port 1 View  Result Date: 10/03/2018 CLINICAL DATA:  Swelling of the lower half of the body.  Shortness of breath and cough. EXAM: PORTABLE CHEST 1 VIEW COMPARISON:  10/02/2018.  Multiple prior chest radiographs. FINDINGS: Chronic cardiomegaly with left ventricular prominence. Chronic aortic atherosclerosis. Chronic pulmonary scarring in the left mid lung. No evidence of heart failure or effusion. No active pulmonary process. IMPRESSION: No active disease. Chronic cardiomegaly, aortic atherosclerosis and left mid lung scarring. Electronically Signed   By: Paulina Fusi M.D.   On: 10/03/2018 19:37   Dg Chest Port 1 View  Result Date: 09/20/2018 CLINICAL DATA:  Initial evaluation for acute shortness of breath. EXAM: PORTABLE CHEST 1 VIEW COMPARISON:  Prior radiograph from 09/17/2018 FINDINGS: Moderate cardiomegaly, stable. Mediastinal silhouette within normal limits. Lungs normally inflated. Mild diffuse pulmonary interstitial congestion/edema. Superimposed left perihilar scarring, similar to previous. No focal infiltrates. No pneumothorax. No visible pleural effusion. No acute osseous finding. Degenerative changes noted about the partially visualized right shoulder. IMPRESSION: 1. Cardiomegaly with mild diffuse pulmonary interstitial congestion/edema. 2. Superimposed left perihilar scarring. Electronically Signed   By: [name]   On: 09/20/2018 06:36   Dg Chest Portable 1 View  Result Date: 09/17/2018 CLINICAL DATA:  Chest pain.  Shortness of breath. EXAM: PORTABLE CHEST 1 VIEW COMPARISON:  09/15/2018. FINDINGS: Cardiomegaly. Diffuse mild interstitial prominence again noted. Slight progressed from prior exam. Findings suggest CHF. Pneumonitis can not be mild left mid lung field subsegmental atelectasis/scarring. Tiny right pleural effusion. No pneumothorax. No acute bony abnormality. Degenerative change thoracic spine. IMPRESSION: Cardiomegaly with diffuse mild interstitial  prominence again noted consistent with CHF. Pneumonitis can not be excluded. There is slight progression from prior exam. Tiny right pleural effusion. Electronically Signed   By: Maisie Fus  Register   On: 09/17/2018 05:45   US Scrotum W/doppler  Result Date: 09/13/2018 CLINICAL DATA:  Swelling and testicular pain EXAM: SCROTAL ULTRASOUND DOPPLER ULTRASOUND OF THE TESTICLES TECHNIQUE: Complete ultrasound examination of the testicles, epididymis, and other scrotal structures was performed. Color and spectral Doppler ultrasound were also utilized to evaluate blood flow to the testicles. COMPARISON:  07/18/2018 FINDINGS: Right testicle Measurements: 3 x 2.9 x 3 cm.  No mass or microlithiasis visualized. Left testicle Measurements: 3.5 x 2.4 x 2.2 cm. No mass or microlithiasis visualized. Right epididymis:  Multiple cysts, the largest measures 2.3 cm Left epididymis:  Normal in size and appearance. Hydrocele:  Small right-sided hydrocele. Varicocele:  None visualized. Pulsed Doppler interrogation of both testes demonstrates normal low resistance arterial and venous waveforms bilaterally. Diffuse skin thickening and scrotal edema. Possible fat hernia in the left groin. IMPRESSION: 1. Negative for acute testicular torsion or suspicious testicular mass lesion 2. Marked scrotal edema. 3. Small right hydrocele.  Multiple right epididymal cysts 4. Possible fat containing left groin hernia Electronically Signed   By: [name]   On: 09/13/2018 03:26   (Echo, Carotid, EGD, Colonoscopy, ERCP)    Subjective: Resting. Mildly agitated.  Discharge Exam: Vitals:   10/09/18 0822 10/09/18 1158  BP: (!) 150/96 (!) 142/81  Pulse: 97 90  Resp: 14 (!) 22  Temp:  97.8 F (36.6 C)  SpO2: 94% 93%   Vitals:   10/09/18 0500 10/09/18 0536 10/09/18 0822 10/09/18 1158  BP:  (!) 152/89 (!) 150/96 (!) 142/81  Pulse:  98 97 90  Resp:  18 14 (!) 22  Temp:  97.7 F (36.5 C)  97.8 F (36.6 C)  TempSrc:  Oral  Oral   SpO2:  93% 94% 93%  Weight: 95.3 kg       General: Pt  is alert, awake, not in acute distress Cardiovascular: RRR, S1/S2 +, no rubs, no gallops Respiratory: CTA bilaterally, no wheezing, no rhonchi Abdominal: Soft, NT, ND, bowel sounds + Extremities: b/l extensive edema, no cyanosis   The results of significant diagnostics from this hospitalization (including imaging, microbiology, ancillary and laboratory) are listed below for reference.     Microbiology: No results found for this or any previous visit (from the past 240 hour(s)).   Labs: BNP (last 3 results) Recent Labs    10/02/18 0258 10/03/18 1651 10/09/18 0056  BNP 1,344.4* 1,117.6* 1,494.9*   Basic Metabolic Panel: Recent Labs  Lab 10/03/18 1650 10/03/18 1800 10/04/18 0303 10/05/18 1113 10/06/18 0923 10/09/18 0056  NA 142  --  140 138 137 135  K 2.6*  --  3.4* 3.8 4.1 3.8  CL 101  --  105 101 100 98  CO2 30  --  26 30 31 29   GLUCOSE 193*  --  211* 266* 431* 383*  BUN 25*  --  25* 20 18 22*  CREATININE 1.04  --  1.12 1.02 1.12 1.11  CALCIUM 8.3*  --  8.1* 8.6* 8.8* 9.1  MG  --  1.8 2.0  --   --   --    Liver Function Tests: Recent Labs  Lab 10/03/18 1650 10/09/18 0056  AST 36 37  ALT 20 23  ALKPHOS 115 125  BILITOT 0.9 0.4  PROT 7.9 7.4  ALBUMIN 3.2* 2.9*   No results for input(s): LIPASE, AMYLASE in the last 168 hours. No results for input(s): AMMONIA in the last 168 hours. CBC: Recent Labs  Lab 10/03/18 1650 10/04/18 0303 10/06/18 0923 10/09/18 0056  WBC 7.3 7.5 9.2 9.7  NEUTROABS 6.0  --   --   --   HGB 11.5* 10.2* 9.8* 10.4*  HCT 38.9* 35.2* 32.6* 35.1*  MCV 84.7 85.9 82.7 83.0  PLT 331 327 303 335   Cardiac Enzymes: No results for input(s): CKTOTAL, CKMB, CKMBINDEX, TROPONINI in the last 168 hours. BNP: Invalid input(s): POCBNP CBG: Recent Labs  Lab 10/05/18 1151 10/09/18 0543 10/09/18 0806 10/09/18 1147 10/09/18 1206  GLUCAP 243* 405* 199* 34* 88   D-Dimer No results  for input(s): DDIMER in the last 72 hours. Hgb A1c No results for input(s): HGBA1C in the last 72 hours. Lipid Profile No results for input(s): CHOL, HDL, LDLCALC, TRIG, CHOLHDL, LDLDIRECT in the last 72 hours. Thyroid function studies No results for input(s): TSH, T4TOTAL, T3FREE, THYROIDAB in the last 72 hours.  Invalid input(s): FREET3 Anemia work up No results for input(s): VITAMINB12, FOLATE, FERRITIN, TIBC, IRON, RETICCTPCT in the last 72 hours. Urinalysis    Component Value Date/Time   COLORURINE COLORLESS (A) 10/03/2018 1651   APPEARANCEUR CLEAR 10/03/2018 1651   LABSPEC 1.004 (L) 10/03/2018 1651   PHURINE 8.0 10/03/2018 1651   GLUCOSEU NEGATIVE 10/03/2018 1651   HGBUR SMALL (A) 10/03/2018 1651   BILIRUBINUR NEGATIVE 10/03/2018 1651   KETONESUR NEGATIVE 10/03/2018 1651   PROTEINUR NEGATIVE 10/03/2018 1651   UROBILINOGEN 1.0 03/14/2015 0610   NITRITE NEGATIVE 10/03/2018 1651   LEUKOCYTESUR NEGATIVE 10/03/2018 1651   Sepsis Labs Invalid input(s): PROCALCITONIN,  WBC,  LACTICIDVEN Microbiology No results found for this or any previous visit (from the past 240 hour(s)).   Time coordinating discharge: 25 minutes  SIGNED:   Lanae Boast, MD  [facility] 10/09/2018, 1:46 PM  If 7PM-7AM, please contact night-coverage [email or web address]

## 2018-10-09 NOTE — Progress Notes (Signed)
Patient called staff back in his room stating that now he wants to leave AMA. MD notified and aware. Patient walked off of the unit with paper scrubs and belongings.

## 2018-10-09 NOTE — Progress Notes (Signed)
40mg  of Lasix given IV at 0920am. MAR froze on one CPU and locked me out

## 2018-10-09 NOTE — H&P (Signed)
History and Physical  [name] ZOX:[id number] DOB: [birth year] DOA: 10/09/2018  Referring physician: ER physician PCP: Patient, No Pcp Per  Outpatient Specialists:    Patient coming from: Patient signed himself out of the hospital AGAINST MEDICAL ADVICE about 2 hours prior to presentation to the ER  Chief Complaint: Shortness of breath/CHF exacerbation  HPI: Patient is a 58 year old African-American male, known to be noncompliant with medical care, has signed himself out of the hospital on several occasions, with past medical history significant for combined systolic and diastolic CHF, hep C, IDDM, hypertension, chronic paranoid schizophrenia/substance use disorder/homelessness, OSA not on CPAP admitted with worsening peripheral edema. Patient has been documented to be homeless in the past.  Apparently, patient signed himself out of the hospital about 2 hours prior to her presentation to the ER.  Patient was on treatment for congestive heart failure exacerbation.  Patient also has component of right heart failure.  Patient continues to endorse shortness of breath, orthopnea and dyspnea on exertion.  Patient has significant peripheral edema with anasarca.  No fever or chills, no chest pain, no headache, no neck pain, no GI symptoms and no urinary symptoms.  Patient will be readmitted to the hospital for further assessment and management.   ED Course: The hospitalist team was called to readmit patient. Pertinent labs: See labs done earlier today prior to patient signing himself out of the hospital AGAINST MEDICAL ADVICE  Review of Systems:  Negative for fever, visual changes, sore throat, rash, new muscle aches, chest pain, dysuria, bleeding, n/[name]/abdominal pain.  Past Medical History:  Diagnosis Date  . Chronic foot pain   . Cocaine abuse (HCC)   . Diabetes mellitus without complication (HCC)   . Hepatitis C    unsure   . Homelessness   . Hypertension   . Neuropathy   .  Polysubstance abuse (HCC)   . Schizophrenia (HCC)   . Sleep apnea   . Systolic and diastolic CHF, chronic (HCC)     Past Surgical History:  Procedure Laterality Date  . MULTIPLE TOOTH EXTRACTIONS       reports that he has been smoking cigarettes. He has a 20.00 pack-year smoking history. He uses smokeless tobacco. He reports current alcohol use. He reports current drug use. Frequency: 7.00 times per week. Drugs: "Crack" cocaine, Cocaine, and Marijuana.  Allergies  Allergen Reactions  . Haldol [Haloperidol] Other (See Comments)    Muscle spasms, loss of voluntary movement. However, pt has taken Thorazine on multiple occasions with no adverse effects.     Family History  Problem Relation Age of Onset  . Hypertension Other   . Diabetes Other      Prior to Admission medications   Medication Sig Start Date End Date Taking? Authorizing Provider  ARIPiprazole (ABILIFY) 10 MG tablet Take 1 tablet (10 mg total) by mouth daily. 09/18/18   Taylor IdeLama, Gagan [name], [name]  aspirin EC 81 MG tablet Take 1 tablet (81 mg total) by mouth daily for 30 days. 09/18/18 10/18/18  Taylor IdeLama, Gagan [name], [name]  atorvastatin (LIPITOR) 40 MG tablet Take 1 tablet (40 mg total) by mouth daily. 09/18/18   Taylor IdeLama, Gagan [name], [name]  bisacodyl (DULCOLAX) 5 MG EC tablet Take 1 tablet (5 mg total) by mouth daily as needed for moderate constipation. 09/04/18   Taylor Bonghompson, Daniel [name], [name]  carbamazepine (TEGRETOL) 200 MG tablet Take 0.5 tablets (100 mg total) by mouth 2 (two) times daily. 09/18/18   Taylor IdeLama, Gagan [name], [name]  furosemide (LASIX) 40  MG tablet Take 1 tablet (40 mg total) by mouth daily. 09/24/18   [name]  hydrocortisone (PROCTO-PAK) 1 % CREA Apply twice a day to hemorrhoids. 09/29/18   [name]  Insulin Glargine (LANTUS) 100 UNIT/ML Solostar Pen Inject 14 Units into the skin daily. 09/18/18   Taylor Ide, [name]  Insulin Pen Needle 29G X MISC Use with insulin 09/18/18   Taylor Ide, [name]  lisinopril (PRINIVIL,ZESTRIL) 10 MG  tablet Take 1 tablet (10 mg total) by mouth daily. 09/18/18   Taylor Ide, [name]  nicotine (NICODERM CQ - DOSED IN MG/24 HOURS) 21 mg/24hr patch Place 1 patch (21 mg total) onto the skin daily. 09/04/18   Taylor Bong, [name]  polyethylene glycol [facility] Ethelene Hal) packet Take 17 g by mouth 2 (two) times daily. 09/18/18   Taylor Ide, [name]  Potassium Chloride ER 20 MEQ TBCR Take 20 mEq by mouth daily. 09/18/18   Taylor Ide, [name]  senna-docusate (SENOKOT-[name]) 8.6-50 MG tablet Take 1 tablet by mouth 2 (two) times daily. 09/04/18   Taylor Bong, [name]  tamsulosin [facility]) 0.4 MG CAPS capsule Take 1 capsule (0.4 mg total) by mouth daily after breakfast. 10/05/18   Taylor Bonds, [name]    Physical Exam: Vitals:   10/09/18 1405 10/09/18 1413  BP:  133/87  Pulse:  96  Resp:  18  Temp:  98.4 F (36.9 C)  TempSrc:  Oral  SpO2:  94%  Weight: 95.3 kg   Height:  (1.753 m)     Constitutional:  . Appears calm and comfortable.  Anasarca. Eyes:  Marland Kitchen Mild pallor. No jaundice.  ENMT:  . external ears, nose appear normal Neck:  . Neck is supple.  No distended neck veins.    Marland Kitchen Respiratory:  . Decreased air entry posteriorly.    Marland Kitchen Respiratory effort normal. No retractions or accessory muscle use Cardiovascular:  . S1S2 . 3+ LE extremity edema   Abdomen:  . Abdomen is soft and non tender. Organs are difficult to assess. Neurologic:  . Awake and alert. . Moves all limbs.  Wt Readings from Last 3 Encounters:  10/09/18 95.3 kg  10/09/18 95.3 kg  10/06/18 91 kg    I have personally reviewed following labs and imaging studies  Labs on Admission:  CBC: Recent Labs  Lab 10/03/18 1650 10/04/18 0303 10/06/18 0923 10/09/18 0056  WBC 7.3 7.5 9.2 9.7  NEUTROABS 6.0  --   --   --   HGB 11.5* 10.2* 9.8* 10.4*  HCT 38.9* 35.2* 32.6* 35.1*  MCV 84.7 85.9 82.7 83.0  PLT 331 327 303 335   Basic Metabolic Panel: Recent Labs  Lab 10/03/18 1650 10/03/18 1800 10/04/18 0303 10/05/18 1113  10/06/18 0923 10/09/18 0056  NA 142  --  140 138 137 135  K 2.6*  --  3.4* 3.8 4.1 3.8  CL 101  --  105 101 100 98  CO2 30  --  GLUCOSE 193*  --  211* 266* 431* 383*  BUN 25*  --  25* 20 18 22*  CREATININE 1.04  --  1.12 1.02 1.12 1.11  CALCIUM 8.3*  --  8.1* 8.6* 8.8* 9.1  MG  --  1.8 2.0  --   --   --    Liver Function Tests: Recent Labs  Lab 10/03/18 1650 10/09/18 0056  AST 36 37  ALT 20 23  ALKPHOS 115 125  BILITOT 0.9 0.4  PROT 7.9 7.4  ALBUMIN 3.2* 2.9*   No results for input([name]): LIPASE, AMYLASE in the last 168 hours. No results for input([name]): AMMONIA in the last 168 hours. Coagulation Profile: Recent Labs  Lab 10/09/18 0056  INR 1.2   Cardiac Enzymes: No results for input([name]): CKTOTAL, CKMB, CKMBINDEX, TROPONINI in the last 168 hours. BNP (last 3 results) No results for input([name]): PROBNP in the last 8760 hours. HbA1C: No results for input([name]): HGBA1C in the last 72 hours. CBG: Recent Labs  Lab 10/05/18 1151 10/09/18 0543 10/09/18 0806 10/09/18 1147 10/09/18 1206  GLUCAP 243* 405* 199* 34* 88   Lipid Profile: No results for input([name]): CHOL, HDL, LDLCALC, TRIG, CHOLHDL, LDLDIRECT in the last 72 hours. Thyroid Function Tests: No results for input([name]): TSH, T4TOTAL, FREET4, T3FREE, THYROIDAB in the last 72 hours. Anemia Panel: No results for input([name]): VITAMINB12, FOLATE, FERRITIN, TIBC, IRON, RETICCTPCT in the last 72 hours. Urine analysis:    Component Value Date/Time   COLORURINE COLORLESS (A) 10/03/2018 1651   APPEARANCEUR CLEAR 10/03/2018 1651   LABSPEC 1.004 (L) 10/03/2018 1651   PHURINE 8.0 10/03/2018 1651   GLUCOSEU NEGATIVE 10/03/2018 1651   HGBUR SMALL (A) 10/03/2018 1651   BILIRUBINUR NEGATIVE 10/03/2018 1651   KETONESUR NEGATIVE 10/03/2018 1651   PROTEINUR NEGATIVE 10/03/2018 1651   UROBILINOGEN 1.0 03/14/2015 0610   NITRITE NEGATIVE 10/03/2018 1651   LEUKOCYTESUR NEGATIVE 10/03/2018 1651   Sepsis Labs:  (procalcitonin:4,lacticidven:4) )No results found for this or any previous visit (from the past 240 hour([name])).    Radiological Exams on Admission: Dg Chest Port 1 View  Result Date: 10/09/2018 CLINICAL DATA:  Initial evaluation for acute scrotal pain and dental pain, edema. EXAM: PORTABLE CHEST 1 VIEW COMPARISON:  Prior radiograph from 10/03/2018 FINDINGS: Moderate cardiomegaly, stable. Mediastinal silhouette within normal limits. Aortic atherosclerosis. Lungs normally inflated. Linear atelectasis/scarring present within the left perihilar region, stable. Mild diffuse pulmonary interstitial congestion without overt pulmonary edema. No pleural effusion. No consolidative opacity. No pneumothorax. No acute osseous finding. Metallic density overlies the right upper quadrant, stable. IMPRESSION: 1. Stable cardiomegaly with mild diffuse pulmonary interstitial congestion without overt pulmonary edema. 2. Aortic atherosclerosis. Electronically Signed   By: [name]   On: 10/09/2018 03:43    EKG: Independently reviewed.   Active Problems:   Acute on chronic combined systolic and diastolic CHF (congestive heart failure) (HCC)   Assessment/Plan Acute on chronic combined systolic and diastolic congestive heart failure: Will readmit patient to the hospital (patient just signed AMA and leave the hospital 2 hours ago). Continue treatment for heart failure. IV diuretics Will add low-dose Coreg. Have a low threshold to consult cardiology team. Discussed need to comply with medical management with the patient Strict I'[name] and O'[name] With patient daily Guarded prognosis.  Right sided heart failure: This may also contribute to the peripheral edema/anasarca. Right heart involvement lives also in a difficult situation Guarded prognosis Watch renal function closely while diuresing patient Low threshold to have cardiology input  Noncompliance: Discussed need to comply with medical  management extensively with the patient  Diabetes mellitus: Continue to optimize.  Hypertension: Continue to optimize.  History of chronic paranoid schizophrenia: Stable. No psychotic symptoms at the moment.  Homelessness: This will make compliance and follow-up difficult.  History of tobacco abuse/alcohol abuse/polysubstance abuse: Counseled.  DVT prophylaxis: Subacute Lovenox Code Status: Code Family Communication:  Disposition Plan: This will depend on hospital course Consults called: Will have a low threshold to consult  cardiology team. Admission status: Inpatient  Time spent: 50 minutes  Berton Mount, [name]  [facility] Pager #: [phone] 7PM-7AM contact night coverage as above  10/09/2018, 4:43 PM

## 2018-10-09 NOTE — ED Triage Notes (Signed)
Pt recently discharged today for leg swelling. Pt initially stated he was discharged and then admitted he left AMA. Pt stated the doctor told him he would die if he left and could come right back to the floor. Contacted the floor, 3E, and this information was not true. Pt wanted to check back in for evaluation.

## 2018-10-09 NOTE — H&P (Addendum)
History and Physical    [name] WJX:[id number] DOB: December 15, 1960 DOA: 10/09/2018  PCP: Patient, No Pcp Per Patient coming from: Home  Chief Complaint: Edema of legs and genitals  HPI: [name] is a 58 y.o. male with medical history significant of chronic combined systolic and diastolic congestive heart failure, hep C, insulin-dependent diabetes mellitus, hypertension, chronic paranoid schizophrenia, substance use disorder, homelessness, OSA not on CPAP presenting to the hospital for evaluation of peripheral edema.  Patient states he is homeless and has been admitted to the hospital several times for the same complaint.  He is currently on the streets and not able to take any medications.  He is requesting help from social work so that he can be placed in a group home and receive financial assistance.  States since he left the hospital last time due to not being able to take any medications he continues to have significant swelling of his legs which extends to his scrotum, penis, and abdomen.  He has been feeling short of breath with exertion and coughing.  Reports a dry weight of 160 pounds.  States he uses crack cocaine and smokes cigarettes.  States he drinks alcohol only sometimes and denies daily use.  Patient kept on repeating that he needs assistance from social work and no additional history could be obtained from him at this time.  Review of Systems: As per HPI otherwise 10 point review of systems negative.  Past Medical History:  Diagnosis Date  . Chronic foot pain   . Cocaine abuse (HCC)   . Diabetes mellitus without complication (HCC)   . Hepatitis C    unsure   . Homelessness   . Hypertension   . Neuropathy   . Polysubstance abuse (HCC)   . Schizophrenia (HCC)   . Sleep apnea   . Systolic and diastolic CHF, chronic (HCC)     Past Surgical History:  Procedure Laterality Date  . MULTIPLE TOOTH EXTRACTIONS       reports that he has been smoking  cigarettes. He has a 20.00 pack-year smoking history. He uses smokeless tobacco. He reports current alcohol use. He reports current drug use. Frequency: 7.00 times per week. Drugs: "Crack" cocaine, Cocaine, and Marijuana.  Allergies  Allergen Reactions  . Haldol [Haloperidol] Other (See Comments)    Muscle spasms, loss of voluntary movement. However, pt has taken Thorazine on multiple occasions with no adverse effects.     Family History  Problem Relation Age of Onset  . Hypertension Other   . Diabetes Other     Prior to Admission medications   Medication Sig Start Date End Date Taking? Authorizing Provider  ARIPiprazole (ABILIFY) 10 MG tablet Take 1 tablet (10 mg total) by mouth daily. 09/18/18   Meredeth Ide, MD  aspirin EC 81 MG tablet Take 1 tablet (81 mg total) by mouth daily for 30 days. 09/18/18 10/18/18  Meredeth Ide, MD  atorvastatin (LIPITOR) 40 MG tablet Take 1 tablet (40 mg total) by mouth daily. 09/18/18   Meredeth Ide, MD  bisacodyl (DULCOLAX) 5 MG EC tablet Take 1 tablet (5 mg total) by mouth daily as needed for moderate constipation. 09/04/18   Rodolph Bong, MD  carbamazepine (TEGRETOL) 200 MG tablet Take 0.5 tablets (100 mg total) by mouth 2 (two) times daily. 09/18/18   Meredeth Ide, MD  furosemide (LASIX) 40 MG tablet Take 1 tablet (40 mg total) by mouth daily. 09/24/18   Derwood Kaplan, MD  hydrocortisone (PROCTO-PAK) 1 % CREA Apply twice a day to hemorrhoids. 09/29/18   Renne Crigler, PA-C  Insulin Glargine (LANTUS) 100 UNIT/ML Solostar Pen Inject 14 Units into the skin daily. 09/18/18   Meredeth Ide, MD  Insulin Pen Needle 29G X MISC Use with insulin 09/18/18   Meredeth Ide, MD  lisinopril (PRINIVIL,ZESTRIL) 10 MG tablet Take 1 tablet (10 mg total) by mouth daily. 09/18/18   Meredeth Ide, MD  nicotine (NICODERM CQ - DOSED IN MG/24 HOURS) 21 mg/24hr patch Place 1 patch (21 mg total) onto the skin daily. 09/04/18   Rodolph Bong, MD  polyethylene glycol  [facility] / Ethelene Hal) packet Take 17 g by mouth 2 (two) times daily. 09/18/18   Meredeth Ide, MD  Potassium Chloride ER 20 MEQ TBCR Take 20 mEq by mouth daily. 09/18/18   Meredeth Ide, MD  senna-docusate (SENOKOT-S) 8.6-50 MG tablet Take 1 tablet by mouth 2 (two) times daily. 09/04/18   Rodolph Bong, MD  tamsulosin [facility]) 0.4 MG CAPS capsule Take 1 capsule (0.4 mg total) by mouth daily after breakfast. 10/05/18   Narda Bonds, MD    Physical Exam: Vitals:   10/09/18 0100 10/09/18 0211 10/09/18 0330 10/09/18 0345  BP: (!) 162/97 (!) 154/106 (!) 162/101 (!) 158/102  Pulse: 93  100 98  Resp:   19 (!) 22  Temp:      TempSrc:      SpO2: 92%  100% 99%    Physical Exam  Constitutional: He is oriented to person, place, and time. He appears well-developed and well-nourished. No distress.  HENT:  Head: Normocephalic.  Eyes: Right eye exhibits no discharge. Left eye exhibits no discharge.  Neck: Neck supple.  Cardiovascular: Normal rate, regular rhythm and intact distal pulses.  Pulmonary/Chest: Effort normal and breath sounds normal. No respiratory distress. He has no wheezes. He has no rales.  Abdominal: Soft. Bowel sounds are normal. He exhibits distension. There is no abdominal tenderness. There is no rebound and no guarding.  Genitourinary:    Genitourinary Comments: Chaperone present Scrotum and penis appear erythematous   Musculoskeletal:        General: Edema present.     Comments: +4 pitting edema of bilateral lower extremities extending up to the abdomen  Neurological: He is alert and oriented to person, place, and time.  Skin: Skin is warm and dry. He is not diaphoretic.     Labs on Admission: I have personally reviewed following labs and imaging studies  CBC: Recent Labs  Lab 10/03/18 1650 10/04/18 0303 10/06/18 0923 10/09/18 0056  WBC 7.3 7.5 9.2 9.7  NEUTROABS 6.0  --   --   --   HGB 11.5* 10.2* 9.8* 10.4*  HCT 38.9* 35.2* 32.6* 35.1*  MCV 84.7 85.9 82.7  83.0  PLT 331 327 303 335   Basic Metabolic Panel: Recent Labs  Lab 10/03/18 1650 10/03/18 1800 10/04/18 0303 10/05/18 1113 10/06/18 0923 10/09/18 0056  NA 142  --  140 138 137 135  K 2.6*  --  3.4* 3.8 4.1 3.8  CL 101  --  105 101 100 98  CO2 30  --  GLUCOSE 193*  --  211* 266* 431* 383*  BUN 25*  --  25* 20 18 22*  CREATININE 1.04  --  1.12 1.02 1.12 1.11  CALCIUM 8.3*  --  8.1* 8.6* 8.8* 9.1  MG  --  1.8 2.0  --   --   --  GFR: Estimated Creatinine Clearance: 81.8 mL/min (by C-G formula based on SCr of 1.11 mg/dL). Liver Function Tests: Recent Labs  Lab 10/03/18 1650 10/09/18 0056  AST 36 37  ALT 20 23  ALKPHOS 115 125  BILITOT 0.9 0.4  PROT 7.9 7.4  ALBUMIN 3.2* 2.9*   No results for input(s): LIPASE, AMYLASE in the last 168 hours. No results for input(s): AMMONIA in the last 168 hours. Coagulation Profile: Recent Labs  Lab 10/09/18 0056  INR 1.2   Cardiac Enzymes: No results for input(s): CKTOTAL, CKMB, CKMBINDEX, TROPONINI in the last 168 hours. BNP (last 3 results) No results for input(s): PROBNP in the last 8760 hours. HbA1C: No results for input(s): HGBA1C in the last 72 hours. CBG: Recent Labs  Lab 10/04/18 1217 10/04/18 1710 10/04/18 2013 10/05/18 0748 10/05/18 1151  GLUCAP 234* 219* 190* 195* 243*   Lipid Profile: No results for input(s): CHOL, HDL, LDLCALC, TRIG, CHOLHDL, LDLDIRECT in the last 72 hours. Thyroid Function Tests: No results for input(s): TSH, T4TOTAL, FREET4, T3FREE, THYROIDAB in the last 72 hours. Anemia Panel: No results for input(s): VITAMINB12, FOLATE, FERRITIN, TIBC, IRON, RETICCTPCT in the last 72 hours. Urine analysis:    Component Value Date/Time   COLORURINE COLORLESS (A) 10/03/2018 1651   APPEARANCEUR CLEAR 10/03/2018 1651   LABSPEC 1.004 (L) 10/03/2018 1651   PHURINE 8.0 10/03/2018 1651   GLUCOSEU NEGATIVE 10/03/2018 1651   HGBUR SMALL (A) 10/03/2018 1651   BILIRUBINUR NEGATIVE 10/03/2018  1651   KETONESUR NEGATIVE 10/03/2018 1651   PROTEINUR NEGATIVE 10/03/2018 1651   UROBILINOGEN 1.0 03/14/2015 0610   NITRITE NEGATIVE 10/03/2018 1651   LEUKOCYTESUR NEGATIVE 10/03/2018 1651    Radiological Exams on Admission: Dg Chest Port 1 View  Result Date: 10/09/2018 CLINICAL DATA:  Initial evaluation for acute scrotal pain and dental pain, edema. EXAM: PORTABLE CHEST 1 VIEW COMPARISON:  Prior radiograph from 10/03/2018 FINDINGS: Moderate cardiomegaly, stable. Mediastinal silhouette within normal limits. Aortic atherosclerosis. Lungs normally inflated. Linear atelectasis/scarring present within the left perihilar region, stable. Mild diffuse pulmonary interstitial congestion without overt pulmonary edema. No pleural effusion. No consolidative opacity. No pneumothorax. No acute osseous finding. Metallic density overlies the right upper quadrant, stable. IMPRESSION: 1. Stable cardiomegaly with mild diffuse pulmonary interstitial congestion without overt pulmonary edema. 2. Aortic atherosclerosis. Electronically Signed   By: [name]   On: 10/09/2018 03:43    EKG: Pending at this time.  Assessment/Plan Principal Problem:   Acute exacerbation of CHF (congestive heart failure) (HCC) Active Problems:   Insulin-requiring or dependent type II diabetes mellitus (HCC)   Essential hypertension, benign   Cocaine use disorder, severe, dependence (HCC)   Chronic paranoid schizophrenia (HCC)   Acute exacerbation of chronic combined systolic and diastolic congestive heart failure -History of homelessness and medication nonadherence.  He is currently not taking any medications.  Presenting with significant peripheral edema including bilateral lower extremities, scrotum, penis, and extending up to the abdomen.  He has had multiple prior ED visits and hospitalizations for the same problem. -Echo done September 02, 2018 with EF 25 to 30% and grade 3 diastolic dysfunction. -BNP 1494.  Chest  x-ray showing stable cardiomegaly with mild diffuse pulmonary interstitial congestion without overt pulmonary edema.  Not hypoxic. -Received IV Lasix 80 mg in the ED.  Continue diuresis with IV Lasix 40 mg every 12 hours starting in the morning. -Monitor intake and output -Daily weights -Low-sodium diet with fluid restriction -Will not start beta-blocker in the setting of acutely  decompensated CHF and the fact that patient is currently not taking any medications.  In addition, he endorses ongoing cocaine use. -Renal function at baseline.  Resume home lisinopril. -Continue monitor renal function with IV diuresis -Patient's dyspnea and cough can be explained by acutely decompensated CHF.  Low suspicion for COVID-19 given no fever or consolidative opacities on chest x-ray.  Poorly controlled insulin-dependent type II diabetes mellitus Last A1c 7.3 in January 2020.  Significantly hyperglycemic in the setting of insulin nonadherence.  No signs of DKA-bicarb 29, anion gap 8. -Resume prior home dose of Lantus 14 units daily. Give Novolog 10 units at this time. Moderate sliding scale insulin with bedtime coverage.  Tobacco use -NicoDerm patch  Alcohol use Patient states he drinks alcohol only sometimes.  Denies daily use. -CIWA protocol; Ativan PRN -Thiamine, folate, multivitamin  Substance abuse Prior UDS consistently positive for cocaine.  Patient endorses ongoing cocaine use.  Will not reorder as he is a known polysubstance abuser.  Chronic paranoid schizophrenia -Resume home Abilify and Tegretol  BPH -Resume home Flomax  Chronic anemia -Stable.  Hemoglobin 10.4, at baseline.  Hypertension Blood pressure elevated in the setting of volume overload. -IV diuresis as above -Resume home lisinopril  Hyperlipidemia -Resume Lipitor  Medication nonadherence secondary to homelessness -Social work consult to help with placement -Case management consult to help with medication assistance   DVT prophylaxis: Lovenox Code Status: Full code Family Communication: No family available. Disposition Plan: Anticipate discharge after clinical improvement. Consults called: None Admission status: It is my clinical opinion that admission to INPATIENT is reasonable and necessary in this 58 y.o. male . presenting with acutely decompensated chronic combined systolic and diastolic congestive heart failure resulting in significant peripheral edema . and pertinent positives on radiographic and laboratory data including: Elevated BNP . Given severity of peripheral edema and poor social situation, patient will require IV diuresis and a hospital stay of at least 2 midnights.  Given the aforementioned, the predictability of an adverse outcome is felt to be significant. I expect that the patient will require at least 2 midnights in the hospital to treat this condition.   This chart was dictated using voice recognition software.  Despite best efforts to proofread, errors can occur which can change the documentation meaning.  John GiovanniVasundhra Jahmal Dunavant MD [facility] Pager [phone]- [phone]  If 7PM-7AM, please contact night-coverage [email or web address] Password [email or web address]  10/09/2018, 4:58 AM

## 2018-10-09 NOTE — Progress Notes (Signed)
Hypoglycemic Event  CBG: 34  Treatment: D50 50 mL (25 gm)  Symptoms: Shaky  Follow-up CBG: Time:1206 CBG Result:88  Possible Reasons for Event: Unknown  Comments/MD notified: Mendel Corning

## 2018-10-09 NOTE — Progress Notes (Signed)
For lunch pt had soup, ice cream and tea all of that added together was 475. Pt was notified his limit was 1200 fluid restriction for the day and that he was already at 640 he ate all of his lunch leaving his count for the day at 165. Pt called and asked for an ice cream and I told him that he would not be able to get the ice cream because he would be over his limit and would not be able to get anything else for the rest of the day. Pt stated that he wanted to leave AMA and Londria, RN was at the bedside. MD was paged and he called back and stated that he would bump the patients daily to 1500 just for today. Pt stated he will stay

## 2018-10-09 NOTE — ED Notes (Signed)
ED TO INPATIENT HANDOFF REPORT  ED Nurse Name and Phone #:  Cori RazorMylan [phone]  S Name/Age/Gender Taylor MemosFrederick L [name] 58 y.o. male Room/Bed: 023C/023C  Code Status   Code Status: Prior  Home/SNF/Other Pt is homeless Patient oriented to: self, place, time and situation Is this baseline? Yes   Triage Complete: Triage complete  Chief Complaint Feet Pain/Z04.6  Triage Note C/o scrotal pain and dental pain 10/10   Allergies Allergies  Allergen Reactions  . Haldol [Haloperidol] Other (See Comments)    Muscle spasms, loss of voluntary movement. However, pt has taken Thorazine on multiple occasions with no adverse effects.     Level of Care/Admitting Diagnosis ED Disposition    ED Disposition Condition Comment   Admit  [facility] [[id number]]  Level of Care: Telemetry Cardiac [[id number]]  Diagnosis: Acute exacerbation of CHF (congestive heart failure) [facility](HCC) [[id number]][[id number]]  Admitting Physician: Taylor GiovanniATHORE, Taylor [[id number]]  Attending Physician: Taylor GiovanniATHORE, Taylor [[id number]]  Estimated length of stay: past midnight tomorrow  Certification:: I certify this patient will need inpatient services for at least 2 midnights  PT Class (Do Not Modify): Inpatient [101]  PT Acc Code (Do Not Modify): Private [1]       B Medical/Surgery History Past Medical History:  Diagnosis Date  . Chronic foot pain   . Cocaine abuse (HCC)   . Diabetes mellitus without complication (HCC)   . Hepatitis C    unsure   . Homelessness   . Hypertension   . Neuropathy   . Polysubstance abuse (HCC)   . Schizophrenia (HCC)   . Sleep apnea   . Systolic and diastolic CHF, chronic (HCC)    Past Surgical History:  Procedure Laterality Date  . MULTIPLE TOOTH EXTRACTIONS       A IV Location/Drains/Wounds Patient Lines/Drains/Airways Status   Active Line/Drains/Airways    Name:   Placement date:   Placement time:   Site:   Days:   Peripheral IV 10/09/18 Right Forearm   10/09/18     0203    Forearm   less than 1   Wound / Incision (Open or Dehisced) 10/04/18 Non-pressure wound Penis Right Open wound with yellow base and no drainage.   10/04/18    1859    Penis   5          Intake/Output Last 24 hours  Intake/Output Summary (Last 24 hours) at 10/09/2018 0422 Last data filed at 10/09/2018 0215 Gross per 24 hour  Intake -  Output 460 ml  Net -460 ml    Labs/Imaging Results for orders placed or performed during the hospital encounter of 10/09/18 (from the past 48 hour(s))  CBC     Status: Abnormal   Collection Time: 10/09/18 12:56 AM  Result Value Ref Range   WBC 9.7 4.0 - 10.5 K/uL   RBC 4.23 4.22 - 5.81 MIL/uL   Hemoglobin 10.4 (L) 13.0 - 17.0 g/dL   HCT 08.635.1 (L) 57.839.0 - 46.952.0 %   MCV 83.0 80.0 - 100.0 fL   MCH 24.6 (L) 26.0 - 34.0 pg   MCHC 29.6 (L) 30.0 - 36.0 g/dL   RDW 62.918.2 (H) 52.811.5 - 41.315.5 %   Platelets 335 150 - 400 K/uL   nRBC 0.0 0.0 - 0.2 %    Comment: Performed at [facility], [address] [place], [place] [address]  Comprehensive metabolic panel     Status: Abnormal   Collection Time: 10/09/18 12:56 AM  Result Value  Ref Range   Sodium 135 135 - 145 mmol/L   Potassium 3.8 3.5 - 5.1 mmol/L   Chloride 98 98 - 111 mmol/L   CO2 29 22 - 32 mmol/L   Glucose, Bld 383 (H) 70 - 99 mg/dL   BUN 22 (H) 6 - 20 mg/dL   Creatinine, Ser 1.70 0.61 - 1.24 mg/dL   Calcium 9.1 8.9 - 01.7 mg/dL   Total Protein 7.4 6.5 - 8.1 g/dL   Albumin 2.9 (L) 3.5 - 5.0 g/dL   AST 37 15 - 41 U/L   ALT 23 0 - 44 U/L   Alkaline Phosphatase 125 38 - 126 U/L   Total Bilirubin 0.4 0.3 - 1.2 mg/dL   GFR calc non Af Amer >60 >60 mL/min   GFR calc Af Amer >60 >60 mL/min   Anion gap 8 5 - 15    Comment: Performed at [facility], [address], [place], [place] [address]  Protime-INR     Status: None   Collection Time: 10/09/18 12:56 AM  Result Value Ref Range   Prothrombin Time 14.9 11.4 - 15.2 seconds   INR 1.2 0.8 - 1.2    Comment: (NOTE) INR goal varies  based on device and disease states. Performed at [facility], [address] [place], [place] [address]   Brain natriuretic peptide     Status: Abnormal   Collection Time: 10/09/18 12:56 AM  Result Value Ref Range   B Natriuretic Peptide 1,494.9 (H) 0.0 - 100.0 pg/mL    Comment: Performed at [facility], [address], [place], [place] [address]   Dg Chest Port 1 View  Result Date: 10/09/2018 CLINICAL DATA:  Initial evaluation for acute scrotal pain and dental pain, edema. EXAM: PORTABLE CHEST 1 VIEW COMPARISON:  Prior radiograph from 10/03/2018 FINDINGS: Moderate cardiomegaly, stable. Mediastinal silhouette within normal limits. Aortic atherosclerosis. Lungs normally inflated. Linear atelectasis/scarring present within the left perihilar region, stable. Mild diffuse pulmonary interstitial congestion without overt pulmonary edema. No pleural effusion. No consolidative opacity. No pneumothorax. No acute osseous finding. Metallic density overlies the right upper quadrant, stable. IMPRESSION: 1. Stable cardiomegaly with mild diffuse pulmonary interstitial congestion without overt pulmonary edema. 2. Aortic atherosclerosis. Electronically Signed   By: [name]   On: 10/09/2018 03:43    Pending Labs Unresulted Labs (From admission, onward)   None      Vitals/Pain Today's Vitals   10/09/18 0030 10/09/18 0100 10/09/18 0211 10/09/18 0330  BP: (!) 168/108 (!) 162/97 (!) 154/106 (!) 162/101  Pulse: 97 93  100  Resp:    19  Temp:      TempSrc:      SpO2: 94% 92%  100%  PainSc:        Isolation Precautions No active isolations  Medications Medications  furosemide (LASIX) injection 80 mg (80 mg Intravenous Given 10/09/18 0119)    Mobility walks Low fall risk   Focused Assessments Cardiac Assessment Handoff:    Lab Results  Component Value Date   CKTOTAL 245 01/27/2015   CKMB 1.4 06/01/2008   TROPONINI <0.03 09/21/2018   Lab Results  Component  Value Date   DDIMER  05/31/2008    0.36        AT THE INHOUSE ESTABLISHED CUTOFF VALUE OF 0.48 ug/mL FEU, THIS ASSAY HAS BEEN DOCUMENTED IN THE LITERATURE TO HAVE   Does the Patient currently have chest pain? No     R Recommendations: See  Admitting Provider Note  Report given to:   Additional Notes:

## 2018-10-09 NOTE — ED Notes (Addendum)
Pt requested 1 orange juice, milk and graham crackers. Pt given the same

## 2018-10-09 NOTE — ED Provider Notes (Signed)
Emergency Department Provider Note   I have reviewed the triage vital signs and the nursing notes.   HISTORY  Chief Complaint Leg Swelling   HPI [name] is a 58 y.o. male with PMH of cocaine abuse, DM, HTN, Schizophrenia, and polysubstance abuse presents to the emergency department for evaluation of lower extremity edema and genital swelling.  Patient signed out AGAINST MEDICAL ADVICE 2 hours ago and has since returned.  He tells me that this was a mistake and he should have stayed for treatment.  He denies any fevers.  No shortness of breath.  He has not taken any of his medications. No radiation of symptoms or modifying factors. Patient apparently was upset regarding his fluid restriction. Denies using any drugs since leaving.   Past Medical History:  Diagnosis Date   Chronic foot pain    Cocaine abuse (HCC)    Diabetes mellitus without complication (HCC)    Hepatitis C    unsure    Homelessness    Hypertension    Neuropathy    Polysubstance abuse (HCC)    Schizophrenia (HCC)    Sleep apnea    Systolic and diastolic CHF, chronic (HCC)     Patient Active Problem List   Diagnosis Date Noted   Adjustment disorder with mixed disturbance of emotions and conduct    Sleep apnea 10/03/2018   Hypokalemia 09/17/2018   Scrotal swelling 09/17/2018   Urinary hesitancy    Constipation    Acute on chronic combined systolic and diastolic CHF (congestive heart failure) (HCC) 08/25/2018   Acute respiratory failure with hypoxia (HCC) 08/25/2018   Acute on chronic combined systolic and diastolic congestive heart failure (HCC) 08/18/2018   Tobacco use 08/18/2018   Acute on chronic combined systolic (congestive) and diastolic (congestive) heart failure (HCC) 08/08/2018   Homelessness 08/08/2018   Smoker 08/08/2018   Acute exacerbation of CHF (congestive heart failure) (HCC) 03/28/2018   Prostate enlargement 03/16/2018   Aortic atherosclerosis  (HCC) 03/16/2018   Aneurysm of abdominal aorta (HCC) 03/16/2018   Chronic foot pain    Schizoaffective disorder, bipolar type (HCC) 09/30/2016   Substance induced mood disorder (HCC) 03/13/2015   Acute kidney failure (HCC) 01/26/2015   Schizophrenia, paranoid type (HCC) 01/17/2015   Suicidal ideation    Drug hallucinosis (HCC) 10/08/2014   Chronic paranoid schizophrenia (HCC) 09/07/2014   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/10/2014   Urinary retention    Cocaine use disorder, severe, dependence (HCC)    Essential hypertension, benign 03/28/2013   Insulin-requiring or dependent type II diabetes mellitus (HCC) 03/15/2013    Past Surgical History:  Procedure Laterality Date   MULTIPLE TOOTH EXTRACTIONS      Allergies Haldol [haloperidol]  Family History  Problem Relation Age of Onset   Hypertension Other    Diabetes Other     Social History Social History   Tobacco Use   Smoking status: Current Every Day Smoker    Packs/day: 1.00    Years: 20.00    Pack years: 20.00    Types: Cigarettes   Smokeless tobacco: Current User  Substance Use Topics   Alcohol use: Yes    Comment: Daily Drinker    Drug use: Yes    Frequency: 7.0 times per week    Types: "Crack" cocaine, Cocaine, Marijuana    Review of Systems  Constitutional: No fever/chills Eyes: No visual changes. ENT: No sore throat. Cardiovascular: Denies chest pain. Positive LE and  scrotal swelling.  Respiratory: Denies shortness of breath. Gastrointestinal: No abdominal pain.  No nausea, no vomiting.  No diarrhea.  No constipation. Genitourinary: Negative for dysuria. Musculoskeletal: Negative for back pain. Skin: Negative for rash. Neurological: Negative for headaches, focal weakness or numbness.  10-point ROS otherwise negative.  ____________________________________________   PHYSICAL EXAM:  VITAL SIGNS: ED Triage  Vitals  Enc Vitals Group     BP 10/09/18 1413 133/87     Pulse Rate 10/09/18 1413 96     Resp 10/09/18 1413 18     Temp 10/09/18 1413 98.4 F (36.9 C)     Temp Source 10/09/18 1413 Oral     SpO2 10/09/18 1413 94 %     Weight 10/09/18 1405 210 lb 1.6 oz (95.3 kg)     Height 10/09/18 1405  (1.753 m)    Constitutional: Alert and oriented. Well appearing and in no acute distress. Eyes: Conjunctivae are normal.  Head: Atraumatic. Nose: No congestion/rhinnorhea. Mouth/Throat: Mucous membranes are moist.  Oropharynx non-erythematous. Neck: No stridor.  Cardiovascular: Normal rate, regular rhythm. Good peripheral circulation. Grossly normal heart sounds.   Respiratory: Normal respiratory effort.  No retractions. Lungs crackles appreciated bilaterally.  Gastrointestinal: Soft and nontender. No distention.  Musculoskeletal: 4+ pitting edema in the bilateral LE to the lower abdomen and scrotum.  Neurologic:  Normal speech and language. No gross focal neurologic deficits are appreciated.  Skin:  Skin is warm, dry and intact. No rash noted.  ____________________________________________  RADIOLOGY  Dg Chest Port 1 View  Result Date: 10/09/2018 CLINICAL DATA:  Initial evaluation for acute scrotal pain and dental pain, edema. EXAM: PORTABLE CHEST 1 VIEW COMPARISON:  Prior radiograph from 10/03/2018 FINDINGS: Moderate cardiomegaly, stable. Mediastinal silhouette within normal limits. Aortic atherosclerosis. Lungs normally inflated. Linear atelectasis/scarring present within the left perihilar region, stable. Mild diffuse pulmonary interstitial congestion without overt pulmonary edema. No pleural effusion. No consolidative opacity. No pneumothorax. No acute osseous finding. Metallic density overlies the right upper quadrant, stable. IMPRESSION: 1. Stable cardiomegaly with mild diffuse pulmonary interstitial congestion without overt pulmonary edema. 2. Aortic atherosclerosis. Electronically Signed    By: [name]   On: 10/09/2018 03:43    ____________________________________________   PROCEDURES  Procedure(s) performed:   Procedures  None  ____________________________________________   INITIAL IMPRESSION / ASSESSMENT AND PLAN / ED COURSE  Pertinent labs & imaging results that were available during my care of the patient were reviewed by me and considered in my medical decision making (see chart for details).   Patient returns to the emergency department after leaving AGAINST MEDICAL ADVICE within 2 hours of discharge.  No respiratory distress but does have crackles on lung exam.  Lab work from this morning and chest x-ray from 12 hours ago reviewed.  Do not plan to repeat these labs at this time.  Unable to confirm the patient's last dose of Lasix but the plan during his admission was 40 mg IV, BID.   Discussed patient's case with Hospitalist to request admission. Patient and family (if present) updated with plan. Care transferred to Hospitalist service.  I reviewed all nursing notes, vitals, pertinent old records, EKGs, labs, imaging (as available).  ____________________________________________  FINAL CLINICAL IMPRESSION(S) / ED DIAGNOSES  Final diagnoses:  Acute on chronic combined systolic and diastolic congestive heart failure (HCC)    Note:  This document was prepared using Dragon voice recognition software and may include unintentional dictation errors.  Alona Bene, MD Emergency Medicine    Kyle Stansell,  Arlyss Repress, MD 10/09/18 [phone]

## 2018-10-09 NOTE — ED Provider Notes (Signed)
[facility] EMERGENCY DEPARTMENT Provider Note   CSN: [id number] Arrival date & time: 10/09/18  0012    History   Chief Complaint Chief Complaint  Patient presents with  . Testicle Pain    HPI [name] is a 58 y.o. male.     Patient presents to the emergency department for evaluation of leg swelling and scrotal swelling.  Symptoms ongoing for an unknown period of time.  Patient reports numbness and tingling of both feet.  He reports a history of neuropathy.  He is afraid that he is going to lose his feet.  Patient asking for orange juice, graham crackers and someone to wash his face and his penis.     Past Medical History:  Diagnosis Date  . Chronic foot pain   . Cocaine abuse (HCC)   . Diabetes mellitus without complication (HCC)   . Hepatitis C    unsure   . Homelessness   . Hypertension   . Neuropathy   . Polysubstance abuse (HCC)   . Schizophrenia (HCC)   . Sleep apnea   . Systolic and diastolic CHF, chronic [facility])     Patient Active Problem List   Diagnosis Date Noted  . Adjustment disorder with mixed disturbance of emotions and conduct   . Sleep apnea 10/03/2018  . Hypokalemia 09/17/2018  . Scrotal swelling 09/17/2018  . Urinary hesitancy   . Constipation   . Acute on chronic combined systolic and diastolic CHF (congestive heart failure) (HCC) 08/25/2018  . Acute respiratory failure with hypoxia (HCC) 08/25/2018  . Acute on chronic combined systolic and diastolic congestive heart failure (HCC) 08/18/2018  . Tobacco use 08/18/2018  . 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 Homelessness 08/08/2018  . Smoker 08/08/2018  . Acute exacerbation of CHF (congestive heart failure) (HCC) 03/28/2018  . Prostate enlargement 03/16/2018  . Aortic atherosclerosis (HCC) 03/16/2018  . 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 Chronic foot pain   . Schizoaffective  disorder, bipolar type (HCC) 09/30/2016  . Substance induced mood disorder (HCC) 03/13/2015  . Acute kidney failure (HCC) 01/26/2015  . Schizophrenia, paranoid type (HCC) 01/17/2015  . Suicidal ideation   . Drug hallucinosis (HCC) 10/08/2014  . Chronic paranoid schizophrenia (HCC) 09/07/2014  . 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 Urinary retention   . Cocaine use disorder, severe, dependence (HCC)   . Essential hypertension, benign 03/28/2013  . 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 MULTIPLE TOOTH EXTRACTIONS          Home Medications    Prior to Admission medications   Medication Sig Start Date End Date Taking? Authorizing Provider  ARIPiprazole (ABILIFY) 10 MG tablet Take 1 tablet (10 mg total) by mouth daily. 09/18/18   Meredeth Ide, MD  aspirin EC 81 MG tablet Take 1 tablet (81 mg total) by mouth daily for 30 days. 09/18/18 10/18/18  Meredeth Ide, MD  atorvastatin (LIPITOR) 40 MG tablet Take 1 tablet (40 mg total) by mouth daily. 09/18/18   Meredeth Ide, MD  bisacodyl (DULCOLAX) 5 MG EC tablet Take 1 tablet (5 mg total) by mouth daily as needed for moderate constipation. 09/04/18   Rodolph Bong, MD  carbamazepine (TEGRETOL) 200 MG tablet Take 0.5 tablets (100 mg total) by mouth 2 (two) times daily.  09/18/18   Meredeth IdeLama, Gagan S, MD  furosemide (LASIX) 40 MG tablet Take 1 tablet (40 mg total) by mouth daily. 09/24/18   Derwood KaplanNanavati, Ankit, MD  hydrocortisone (PROCTO-PAK) 1 % CREA Apply twice a day to hemorrhoids. 09/29/18   Renne CriglerGeiple, Joshua, PA-C  Insulin Glargine (LANTUS) 100 UNIT/ML Solostar Pen Inject 14 Units into the skin daily. 09/18/18   Meredeth IdeLama, Gagan S, MD  Insulin Pen Needle 29G X 8MM MISC Use with insulin 09/18/18   Meredeth IdeLama, Gagan S, MD  lisinopril (PRINIVIL,ZESTRIL) 10 MG tablet Take 1 tablet (10 mg total) by mouth  daily. 09/18/18   Meredeth IdeLama, Gagan S, MD  nicotine (NICODERM CQ - DOSED IN MG/24 HOURS) 21 mg/24hr patch Place 1 patch (21 mg total) onto the skin daily. 09/04/18   Rodolph Bonghompson, Daniel V, MD  polyethylene glycol [facility](MIRALAX / Ethelene HalGLYCOLAX) packet Take 17 g by mouth 2 (two) times daily. 09/18/18   Meredeth IdeLama, Gagan S, MD  Potassium Chloride ER 20 MEQ TBCR Take 20 mEq by mouth daily. 09/18/18   Meredeth IdeLama, Gagan S, MD  senna-docusate (SENOKOT-S) 8.6-50 MG tablet Take 1 tablet by mouth 2 (two) times daily. 09/04/18   Rodolph Bonghompson, Daniel V, MD  tamsulosin [facility](FLOMAX) 0.4 MG CAPS capsule Take 1 capsule (0.4 mg total) by mouth daily after breakfast. 10/05/18   Narda BondsNettey, Ralph A, MD    Family History Family History  Problem Relation Age of Onset  . Hypertension Other   . Diabetes Other     Social History Social History   Tobacco Use  . Smoking status: Current Every Day Smoker    Packs/day: 1.00    Years: 20.00    Pack years: 20.00    Types: Cigarettes  . Smokeless tobacco: Current User  Substance Use Topics  . Alcohol use: Yes    Comment: Daily Drinker   . Drug use: Yes    Frequency: 7.0 times per week    Types: "Crack" cocaine, Cocaine, Marijuana     Allergies   Haldol [haloperidol]   Review of Systems Review of Systems  Cardiovascular: Positive for leg swelling.  Genitourinary: Positive for scrotal swelling.  All other systems reviewed and are negative.    Physical Exam Updated Vital Signs BP (!) 154/106   Pulse 93   Temp 98.2 F (36.8 C) (Oral)   SpO2 92%   Physical Exam Vitals signs and nursing note reviewed.  Constitutional:      General: He is not in acute distress.    Appearance: Normal appearance. He is well-developed.  HENT:     Head: Normocephalic and atraumatic.     Right Ear: Hearing normal.     Left Ear: Hearing normal.     Nose: Nose normal.  Eyes:     Conjunctiva/sclera: Conjunctivae normal.     Pupils: Pupils are equal, round, and reactive to light.  Neck:     Musculoskeletal: Normal  range of motion and neck supple.  Cardiovascular:     Rate and Rhythm: Regular rhythm.     Heart sounds: S1 normal and S2 normal. No murmur. No friction rub. No gallop.   Pulmonary:     Effort: Pulmonary effort is normal. No respiratory distress.     Breath sounds: Normal breath sounds.  Chest:     Chest wall: No tenderness.  Abdominal:     General: Bowel sounds are normal.     Palpations: Abdomen is soft.     Tenderness: There is no abdominal tenderness. There is no guarding or rebound. Negative  signs include Murphy's sign and McBurney's sign.     Hernia: No hernia is present.  Genitourinary:    Penis: Circumcised. Swelling present.      Scrotum/Testes:        Right: Swelling present.        Left: Swelling present.  Musculoskeletal: Normal range of motion.     Right lower leg: Edema present.     Left lower leg: Edema present.  Skin:    General: Skin is warm and dry.     Findings: No rash.  Neurological:     Mental Status: He is alert and oriented to person, place, and time.     GCS: GCS eye subscore is 4. GCS verbal subscore is 5. GCS motor subscore is 6.     Cranial Nerves: No cranial nerve deficit.     Sensory: No sensory deficit.     Coordination: Coordination normal.  Psychiatric:        Speech: Speech normal.        Behavior: Behavior normal.        Thought Content: Thought content normal.      ED Treatments / Results  Labs (all labs ordered are listed, but only abnormal results are displayed) Labs Reviewed  CBC - Abnormal; Notable for the following components:      Result Value   Hemoglobin 10.4 (*)    HCT 35.1 (*)    MCH 24.6 (*)    MCHC 29.6 (*)    RDW 18.2 (*)    All other components within normal limits  COMPREHENSIVE METABOLIC PANEL - Abnormal; Notable for the following components:   Glucose, Bld 383 (*)    BUN 22 (*)    Albumin 2.9 (*)    All other components within normal limits  BRAIN NATRIURETIC PEPTIDE - Abnormal; Notable for the following  components:   B Natriuretic Peptide 1,494.9 (*)    All other components within normal limits  PROTIME-INR    EKG None  ED ECG REPORT   Date: 10/09/2018  Rate: 96  Rhythm: normal sinus rhythm  QRS Axis: normal  Intervals: normal  ST/T Wave abnormalities: nonspecific T wave changes  Conduction Disutrbances:none  Narrative Interpretation:   Old EKG Reviewed: unchanged  I have personally reviewed the EKG tracing and agree with the computerized printout as noted.    Radiology No results found.  Procedures Procedures (including critical care time)  Medications Ordered in ED Medications  furosemide (LASIX) injection 80 mg (80 mg Intravenous Given 10/09/18 0119)     Initial Impression / Assessment and Plan / ED Course  I have reviewed the triage vital signs and the nursing notes.  Pertinent labs & imaging results that were available during my care of the patient were reviewed by me and considered in my medical decision making (see chart for details).        Patient presents to the emergency department for evaluation of swelling of both legs and testicles.  Patient has a history of combined systolic and diastolic heart failure.  He was recently hospitalized and diuresed, since discharge has been noncompliant with his medications.  Patient has significant edema to the abdomen, consistent with anasarca.  He has significant swelling of the scrotum and edema of his penis as well as bilateral significant pitting edema.  He was given Lasix 80 mg IV and diuresis as initiated.  He will require hospitalization for further management.  Final Clinical Impressions(s) / ED Diagnoses   Final diagnoses:  Anasarca  ED Discharge Orders    None       Gilda Crease, MD 10/09/18 [phone]

## 2018-10-09 NOTE — ED Notes (Signed)
ED TO INPATIENT HANDOFF REPORT  ED Nurse Name and Phone #: Seward Grater [phone]  S Name/Age/Gender [name] 58 y.o. male Room/Bed: 018C/018C  Code Status   Code Status: Prior  Home/SNF/Other Home Patient oriented to: self, place, time and situation Is this baseline? Yes   Triage Complete: Triage complete  Chief Complaint leg swelling  Triage Note Pt recently discharged today for leg swelling. Pt initially stated he was discharged and then admitted he left AMA. Pt stated the doctor told him he would die if he left and could come right back to the floor. Contacted the floor, 3E, and this information was not true. Pt wanted to check back in for evaluation.    Allergies Allergies  Allergen Reactions  . Haldol [Haloperidol] Other (See Comments)    Muscle spasms, loss of voluntary movement. However, pt has taken Thorazine on multiple occasions with no adverse effects.     Level of Care/Admitting Diagnosis ED Disposition    ED Disposition Condition Comment   Admit  Hospital Area: [facility] [[id number]]  Level of Care: Telemetry Cardiac [103]  Diagnosis: Acute on chronic combined systolic and diastolic CHF (congestive heart failure) (HCC) [454098]  Admitting Physician: Woody Seller  Attending Physician: Berton Mount I [[id number]]  Estimated length of stay: 3 - 4 days  Certification:: I certify this patient will need inpatient services for at least 2 midnights  PT Class (Do Not Modify): Inpatient [101]  PT Acc Code (Do Not Modify): Private [1]       B Medical/Surgery History Past Medical History:  Diagnosis Date  . Chronic foot pain   . Cocaine abuse (HCC)   . Diabetes mellitus without complication (HCC)   . Hepatitis C    unsure   . Homelessness   . Hypertension   . Neuropathy   . Polysubstance abuse (HCC)   . Schizophrenia (HCC)   . Sleep apnea   . Systolic and diastolic CHF, chronic (HCC)    Past Surgical History:   Procedure Laterality Date  . MULTIPLE TOOTH EXTRACTIONS       A IV Location/Drains/Wounds Patient Lines/Drains/Airways Status   Active Line/Drains/Airways    Name:   Placement date:   Placement time:   Site:   Days:   Peripheral IV 10/09/18 Right Antecubital   10/09/18    1552    Antecubital   less than 1   Wound / Incision (Open or Dehisced) 10/04/18 Non-pressure wound Penis Right Open wound with yellow base and no drainage.   10/04/18    1859    Penis   5          Intake/Output Last 24 hours No intake or output data in the 24 hours ending 10/09/18 1644  Labs/Imaging Results for orders placed or performed during the hospital encounter of 10/09/18 (from the past 48 hour(s))  CBC     Status: Abnormal   Collection Time: 10/09/18 12:56 AM  Result Value Ref Range   WBC 9.7 4.0 - 10.5 K/uL   RBC 4.23 4.22 - 5.81 MIL/uL   Hemoglobin 10.4 (L) 13.0 - 17.0 g/dL   HCT 11.9 (L) 14.7 - 82.9 %   MCV 83.0 80.0 - 100.0 fL   MCH 24.6 (L) 26.0 - 34.0 pg   MCHC 29.6 (L) 30.0 - 36.0 g/dL   RDW 56.2 (H) 13.0 - 86.5 %   Platelets 335 150 - 400 K/uL   nRBC 0.0 0.0 - 0.2 %  Comment: Performed at [facility], [address] [place], [place] [address]  Comprehensive metabolic panel     Status: Abnormal   Collection Time: 10/09/18 12:56 AM  Result Value Ref Range   Sodium 135 135 - 145 mmol/L   Potassium 3.8 3.5 - 5.1 mmol/L   Chloride 98 98 - 111 mmol/L   CO2 29 22 - 32 mmol/L   Glucose, Bld 383 (H) 70 - 99 mg/dL   BUN 22 (H) 6 - 20 mg/dL   Creatinine, Ser 5.28 0.61 - 1.24 mg/dL   Calcium 9.1 8.9 - 41.3 mg/dL   Total Protein 7.4 6.5 - 8.1 g/dL   Albumin 2.9 (L) 3.5 - 5.0 g/dL   AST 37 15 - 41 U/L   ALT 23 0 - 44 U/L   Alkaline Phosphatase 125 38 - 126 U/L   Total Bilirubin 0.4 0.3 - 1.2 mg/dL   GFR calc non Af Amer >60 >60 mL/min   GFR calc Af Amer >60 >60 mL/min   Anion gap 8 5 - 15    Comment: Performed at [facility], [address], [place], [place] [address]   Protime-INR     Status: None   Collection Time: 10/09/18 12:56 AM  Result Value Ref Range   Prothrombin Time 14.9 11.4 - 15.2 seconds   INR 1.2 0.8 - 1.2    Comment: (NOTE) INR goal varies based on device and disease states. Performed at [facility], [address] [place], [place] [address]   Brain natriuretic peptide     Status: Abnormal   Collection Time: 10/09/18 12:56 AM  Result Value Ref Range   B Natriuretic Peptide 1,494.9 (H) 0.0 - 100.0 pg/mL    Comment: Performed at [facility], [address] [place], [place] [address]  Glucose, capillary     Status: Abnormal   Collection Time: 10/09/18  5:43 AM  Result Value Ref Range   Glucose-Capillary 405 (H) 70 - 99 mg/dL   Comment 1 Notify RN    Comment 2 Document in Chart   Glucose, capillary     Status: Abnormal   Collection Time: 10/09/18  8:06 AM  Result Value Ref Range   Glucose-Capillary 199 (H) 70 - 99 mg/dL  Glucose, capillary     Status: Abnormal   Collection Time: 10/09/18 11:47 AM  Result Value Ref Range   Glucose-Capillary 34 (LL) 70 - 99 mg/dL  Glucose, capillary     Status: None   Collection Time: 10/09/18 12:06 PM  Result Value Ref Range   Glucose-Capillary 88 70 - 99 mg/dL   Dg Chest Port 1 View  Result Date: 10/09/2018 CLINICAL DATA:  Initial evaluation for acute scrotal pain and dental pain, edema. EXAM: PORTABLE CHEST 1 VIEW COMPARISON:  Prior radiograph from 10/03/2018 FINDINGS: Moderate cardiomegaly, stable. Mediastinal silhouette within normal limits. Aortic atherosclerosis. Lungs normally inflated. Linear atelectasis/scarring present within the left perihilar region, stable. Mild diffuse pulmonary interstitial congestion without overt pulmonary edema. No pleural effusion. No consolidative opacity. No pneumothorax. No acute osseous finding. Metallic density overlies the right upper quadrant, stable. IMPRESSION: 1. Stable cardiomegaly with mild diffuse pulmonary interstitial congestion  without overt pulmonary edema. 2. Aortic atherosclerosis. Electronically Signed   By: [name]   On: 10/09/2018 03:43    Pending Labs [employer] (From admission, onward)    Start     Ordered   Signed and Held  CBC  (enoxaparin (LOVENOX)  CrCl >/= 30 ml/min)  Once,   R    Comments:  Baseline for enoxaparin therapy IF NOT ALREADY DRAWN.  Notify MD if PLT < 100 K.    Signed and Held   Signed and Held  Creatinine, serum  (enoxaparin (LOVENOX)    CrCl >/= 30 ml/min)  Once,   R    Comments:  Baseline for enoxaparin therapy IF NOT ALREADY DRAWN.    Signed and Held   Signed and Held  Creatinine, serum  (enoxaparin (LOVENOX)    CrCl >/= 30 ml/min)  Weekly,   R    Comments:  while on enoxaparin therapy    Signed and Held   Signed and Held  Comprehensive metabolic panel  Tomorrow morning,   R     Signed and Held   Signed and Held  CBC  Tomorrow morning,   R     Signed and Held          Vitals/Pain Today's Vitals   10/09/18 1405 10/09/18 1413  BP:  133/87  Pulse:  96  Resp:  18  Temp:  98.4 F (36.9 C)  TempSrc:  Oral  SpO2:  94%  Weight: 95.3 kg   Height: 5\' 9"  (1.753 m)   PainSc: 9      Isolation Precautions No active isolations  Medications Medications - No data to display  Mobility walks Low fall risk   Focused Assessments    R Recommendations: See Admitting Provider Note  Report given to:   Additional Notes:

## 2018-10-09 NOTE — ED Triage Notes (Signed)
C/o scrotal pain and dental pain 10/10

## 2018-10-10 DIAGNOSIS — I5043 Acute on chronic combined systolic (congestive) and diastolic (congestive) heart failure: Secondary | ICD-10-CM

## 2018-10-10 LAB — COMPREHENSIVE METABOLIC PANEL
ALT: 20 U/L (ref 0–44)
AST: 33 U/L (ref 15–41)
Albumin: 2.7 g/dL — ABNORMAL LOW (ref 3.5–5.0)
Alkaline Phosphatase: 108 U/L (ref 38–126)
Anion gap: 11 (ref 5–15)
BUN: 26 mg/dL — ABNORMAL HIGH (ref 6–20)
CO2: 27 mmol/L (ref 22–32)
Calcium: 9.1 mg/dL (ref 8.9–10.3)
Chloride: 99 mmol/L (ref 98–111)
Creatinine, Ser: 1.17 mg/dL (ref 0.61–1.24)
GFR calc Af Amer: >60 mL/min (ref >60)
GFR calc non Af Amer: >60 mL/min (ref >60)
Glucose, Bld: 275 mg/dL — ABNORMAL HIGH (ref 70–99)
Potassium: 3.9 mmol/L (ref 3.5–5.1)
Sodium: 137 mmol/L (ref 135–145)
Total Bilirubin: 0.8 mg/dL (ref 0.3–1.2)
Total Protein: 6.7 g/dL (ref 6.5–8.1)

## 2018-10-10 LAB — CBC
HCT: 32.4 % — ABNORMAL LOW (ref 39.0–52.0)
Hemoglobin: 9.9 g/dL — ABNORMAL LOW (ref 13.0–17.0)
MCH: 24.8 pg — ABNORMAL LOW (ref 26.0–34.0)
MCHC: 30.6 g/dL (ref 30.0–36.0)
MCV: 81 fL (ref 80.0–100.0)
Platelets: 287 10*3/uL (ref 150–400)
RBC: 4 MIL/uL — ABNORMAL LOW (ref 4.22–5.81)
RDW: 17.7 % — ABNORMAL HIGH (ref 11.5–15.5)
WBC: 8.3 10*3/uL (ref 4.0–10.5)
nRBC: 0 % (ref 0.0–0.2)

## 2018-10-10 NOTE — Discharge Summary (Addendum)
Physician Discharge Summary  [name]:[id number] DOB: August 27, 1960 DOA: 10/09/2018  PCP: Patient, No Pcp Per  Admit date: 10/09/2018 Discharge date: 10/10/2018  PATIENT LEFT AGAINST MEDICAL ADVICE  Brief/Interim Summary: 58 year old African-American male, known to be noncompliant with medical care, has signed himself out of the hospital on several occasions, with past medical history significant for combined systolic and diastolic CHF, hep C, IDDM, hypertension, chronic paranoid schizophrenia/substance use disorder/homelessness, OSA not on CPAP admitted with worsening peripheral edema. Patient has been documented to be homeless in the past.  Apparently, patient signed himself out of the hospital about 2 hours prior to her presentation to the ER.  Patient was on treatment for congestive heart failure exacerbation.  Patient also has component of right heart failure.  Patient continues to endorse shortness of breath, orthopnea and dyspnea on exertion.  Patient has significant peripheral edema with anasarca.  No fever or chills, no chest pain, no headache, no neck pain, no GI symptoms and no urinary symptoms.  Patient will be readmitted to the hospital for further assessment and management.   ED Course: The hospitalist team was called to readmit patient. Pertinent labs: See labs done earlier today prior to patient signing himself out of the hospital AGAINST MEDICAL ADVICE  Discharge Diagnoses:  Active Problems:   Acute on chronic combined systolic and diastolic CHF (congestive heart failure) (HCC)  Acute on chronic combined systolic and diastolic congestive heart failure: Patient re-admitted 2 hours after leaving AMA from prior admit Started on IV lasix Before patient could be seen by attending physician, patient elected to leave AMA because he wanted a diet not recommended for his heart failure Patient was made aware of risk of leaving including decompensation and even death  Right  sided heart failure: Plan per above  Noncompliance: At time of admit discussed need to comply with medical management extensively with the patient  Diabetes mellitus: Continue to optimize.  Hypertension: Continue to optimize.  History of chronic paranoid schizophrenia: Stable. No psychotic symptoms at the moment.  Homelessness: This will make compliance and follow-up difficult.  History of tobacco abuse/alcohol abuse/polysubstance abuse: Counseled.  Discharge Instructions     Allergies  Allergen Reactions  . Haldol [Haloperidol] Other (See Comments)    Muscle spasms, loss of voluntary movement. However, pt has taken Thorazine on multiple occasions with no adverse effects.     Procedures/Studies: Dg Chest 2 View  Result Date: 10/02/2018 CLINICAL DATA:  Cough, abdominal pain EXAM: CHEST - 2 VIEW COMPARISON:  09/21/2018 FINDINGS: Lingular scarring. No focal consolidation. No pleural effusion or pneumothorax. Cardiomegaly. Degenerative changes of the visualized thoracolumbar spine. IMPRESSION: Normal chest radiographs. Electronically Signed   By: Charline Bills M.D.   On: 10/02/2018 03:42   Dg Chest 2 View  Result Date: 09/21/2018 CLINICAL DATA:  Chest pain, groin swelling, suicidal ideation EXAM: CHEST - 2 VIEW COMPARISON:  09/20/2018 FINDINGS: Linear scarring in the lingula. Lungs otherwise clear. No pleural effusion or pneumothorax. Cardiomegaly. Mild degenerative changes of the visualized thoracolumbar spine. IMPRESSION: No evidence of acute cardiopulmonary disease. Electronically Signed   By: Charline Bills M.D.   On: 09/21/2018 19:04   Dg Chest 2 View  Result Date: 09/15/2018 CLINICAL DATA:  Chest pain EXAM: CHEST - 2 VIEW COMPARISON:  09/13/2018 FINDINGS: Cardiomegaly with vascular congestion. Possible early interstitial edema. No confluent opacities or effusions. No acute bony abnormality. IMPRESSION: Cardiomegaly with vascular congestion and possible early  interstitial edema. Electronically Signed   By: Charlett Nose M.D.  On: 09/15/2018 03:04   Dg Chest 2 View  Result Date: 09/13/2018 CLINICAL DATA:  Groin swelling short of breath EXAM: CHEST - 2 VIEW COMPARISON:  09/10/2018, 09/08/2018, 09/01/2018 FINDINGS: Cardiomegaly with vascular congestion. Linear atelectasis in the left mid lung. No acute consolidation or significant pleural effusion. No pneumothorax. Aortic atherosclerosis. IMPRESSION: Cardiomegaly with mild central vascular congestion. Electronically Signed   By: [name]   On: 09/13/2018 03:19   Dg Chest 2 View  Result Date: 09/10/2018 CLINICAL DATA:  Shortness of breath EXAM: CHEST - 2 VIEW COMPARISON:  September 08, 2018 FINDINGS: There is atelectatic change in the left mid lung. There is no edema or consolidation. There is cardiomegaly with pulmonary venous hypertension. There is no appreciable adenopathy. No bone lesions. IMPRESSION: Pulmonary vascular congestion. Left midlung atelectasis. No frank edema or consolidation. Electronically Signed   By: Bretta Bang III M.D.   On: 09/10/2018 14:17   US Scrotum  Result Date: 09/17/2018 CLINICAL DATA:  Scrotal swelling. EXAM: ULTRASOUND OF SCROTUM TECHNIQUE: Complete ultrasound examination of the testicles, epididymis, and other scrotal structures was performed. COMPARISON:  Scrotal ultrasound dated September 13, 2018. FINDINGS: Right testicle Measurements: 3.4 x 2.9 x 2.7 cm. No mass or microlithiasis visualized. Left testicle Measurements: 3.4 x 1.9 x 2.2 cm. Unchanged mild heterogeneity. No mass or microlithiasis visualized. Right epididymis:  Unchanged 2.8 cm cyst in the epididymal head. Left epididymis:  Normal in size and appearance. Hydrocele:  Unchanged small right hydrocele. Varicocele:  None visualized. Unchanged diffuse scrotal edema. IMPRESSION: 1. Relatively unchanged diffuse scrotal edema. No focal fluid collection. 2. Unchanged small right hydrocele and right epididymal head  cyst. Electronically Signed   By: Obie Dredge M.D.   On: 09/17/2018 12:22   Dg Chest Port 1 View  Result Date: 10/09/2018 CLINICAL DATA:  Initial evaluation for acute scrotal pain and dental pain, edema. EXAM: PORTABLE CHEST 1 VIEW COMPARISON:  Prior radiograph from 10/03/2018 FINDINGS: Moderate cardiomegaly, stable. Mediastinal silhouette within normal limits. Aortic atherosclerosis. Lungs normally inflated. Linear atelectasis/scarring present within the left perihilar region, stable. Mild diffuse pulmonary interstitial congestion without overt pulmonary edema. No pleural effusion. No consolidative opacity. No pneumothorax. No acute osseous finding. Metallic density overlies the right upper quadrant, stable. IMPRESSION: 1. Stable cardiomegaly with mild diffuse pulmonary interstitial congestion without overt pulmonary edema. 2. Aortic atherosclerosis. Electronically Signed   By: [name]   On: 10/09/2018 03:43   Dg Chest Port 1 View  Result Date: 10/03/2018 CLINICAL DATA:  Swelling of the lower half of the body. Shortness of breath and cough. EXAM: PORTABLE CHEST 1 VIEW COMPARISON:  10/02/2018.  Multiple prior chest radiographs. FINDINGS: Chronic cardiomegaly with left ventricular prominence. Chronic aortic atherosclerosis. Chronic pulmonary scarring in the left mid lung. No evidence of heart failure or effusion. No active pulmonary process. IMPRESSION: No active disease. Chronic cardiomegaly, aortic atherosclerosis and left mid lung scarring. Electronically Signed   By: Paulina Fusi M.D.   On: 10/03/2018 19:37   Dg Chest Port 1 View  Result Date: 09/20/2018 CLINICAL DATA:  Initial evaluation for acute shortness of breath. EXAM: PORTABLE CHEST 1 VIEW COMPARISON:  Prior radiograph from 09/17/2018 FINDINGS: Moderate cardiomegaly, stable. Mediastinal silhouette within normal limits. Lungs normally inflated. Mild diffuse pulmonary interstitial congestion/edema. Superimposed left perihilar  scarring, similar to previous. No focal infiltrates. No pneumothorax. No visible pleural effusion. No acute osseous finding. Degenerative changes noted about the partially visualized right shoulder. IMPRESSION: 1. Cardiomegaly with mild diffuse pulmonary interstitial  congestion/edema. 2. Superimposed left perihilar scarring. Electronically Signed   By: Rise MuBenjamin  McClintock M.D.   On: 09/20/2018 06:36   Dg Chest Portable 1 View  Result Date: 09/17/2018 CLINICAL DATA:  Chest pain.  Shortness of breath. EXAM: PORTABLE CHEST 1 VIEW COMPARISON:  09/15/2018. FINDINGS: Cardiomegaly. Diffuse mild interstitial prominence again noted. Slight progressed from prior exam. Findings suggest CHF. Pneumonitis can not be mild left mid lung field subsegmental atelectasis/scarring. Tiny right pleural effusion. No pneumothorax. No acute bony abnormality. Degenerative change thoracic spine. IMPRESSION: Cardiomegaly with diffuse mild interstitial prominence again noted consistent with CHF. Pneumonitis can not be excluded. There is slight progression from prior exam. Tiny right pleural effusion. Electronically Signed   By: Maisie Fushomas  Register   On: 09/17/2018 05:45   [place] Scrotum W/doppler  Result Date: 09/13/2018 CLINICAL DATA:  Swelling and testicular pain EXAM: SCROTAL ULTRASOUND DOPPLER ULTRASOUND OF THE TESTICLES TECHNIQUE: Complete ultrasound examination of the testicles, epididymis, and other scrotal structures was performed. Color and spectral Doppler ultrasound were also utilized to evaluate blood flow to the testicles. COMPARISON:  07/18/2018 FINDINGS: Right testicle Measurements: 3 x 2.9 x 3 cm.  No mass or microlithiasis visualized. Left testicle Measurements: 3.5 x 2.4 x 2.2 cm. No mass or microlithiasis visualized. Right epididymis:  Multiple cysts, the largest measures 2.3 cm Left epididymis:  Normal in size and appearance. Hydrocele:  Small right-sided hydrocele. Varicocele:  None visualized. Pulsed Doppler interrogation  of both testes demonstrates normal low resistance arterial and venous waveforms bilaterally. Diffuse skin thickening and scrotal edema. Possible fat hernia in the left groin. IMPRESSION: 1. Negative for acute testicular torsion or suspicious testicular mass lesion 2. Marked scrotal edema. 3. Small right hydrocele.  Multiple right epididymal cysts 4. Possible fat containing left groin hernia Electronically Signed   By: Jasmine PangKim  Fujinaga M.D.   On: 09/13/2018 03:26     Subjective: Left AMA before patient could be seen by attending physician  Discharge Exam: Vitals:   10/09/18 2045 10/10/18 0321  BP: (!) 143/82 131/83  Pulse: (!) 102 97  Resp:  18  Temp: 98.6 F (37 C) (!) 97.4 F (36.3 C)  SpO2: (!) 88% 91%   Vitals:   10/09/18 1413 10/09/18 1700 10/09/18 2045 10/10/18 0321  BP: 133/87  (!) 143/82 131/83  Pulse: 96  (!) 102 97  Resp: 18   18  Temp: 98.4 F (36.9 C)  98.6 F (37 C) (!) 97.4 F (36.3 C)  TempSrc: Oral  Oral Oral  SpO2: 94%  (!) 88% 91%  Weight:  93.7 kg  96.1 kg  Height:  5\' 9"  (1.753 m)       The results of significant diagnostics from this hospitalization (including imaging, microbiology, ancillary and laboratory) are listed below for reference.     Microbiology: No results found for this or any previous visit (from the past 240 hour(s)).   Labs: BNP (last 3 results) Recent Labs    10/02/18 0258 10/03/18 1651 10/09/18 0056  BNP 1,344.4* 1,117.6* 1,494.9*   Basic Metabolic Panel: Recent Labs  Lab 10/03/18 1800 10/04/18 0303 10/05/18 1113 10/06/18 0923 10/09/18 0056 10/09/18 1746 10/10/18 0310  NA  --  140 138 137 135  --  137  K  --  3.4* 3.8 4.1 3.8  --  3.9  CL  --  105 101 100 98  --  99  CO2  --  26 30 31 29   --  27  GLUCOSE  --  211* 266* 431*  383*  --  275*  BUN  --  25* 20 18 22*  --  26*  CREATININE  --  1.12 1.02 1.12 1.11 1.09 1.17  CALCIUM  --  8.1* 8.6* 8.8* 9.1  --  9.1  MG 1.8 2.0  --   --   --   --   --    Liver Function  Tests: Recent Labs  Lab 10/03/18 1650 10/09/18 0056 10/10/18 0310  AST 36 37 33  ALT ALKPHOS 115 125 108  BILITOT 0.9 0.4 0.8  PROT 7.9 7.4 6.7  ALBUMIN 3.2* 2.9* 2.7*   No results for input(s): LIPASE, AMYLASE in the last 168 hours. No results for input(s): AMMONIA in the last 168 hours. CBC: Recent Labs  Lab 10/03/18 1650 10/04/18 0303 10/06/18 0923 10/09/18 0056 10/09/18 1746 10/10/18 0310  WBC 7.3 7.5 9.2 9.7 10.4 8.3  NEUTROABS 6.0  --   --   --   --   --   HGB 11.5* 10.2* 9.8* 10.4* 9.7* 9.9*  HCT 38.9* 35.2* 32.6* 35.1* 32.4* 32.4*  MCV 84.7 85.9 82.7 83.0 81.2 81.0  PLT 331 327 303 335 328 287   Cardiac Enzymes: No results for input(s): CKTOTAL, CKMB, CKMBINDEX, TROPONINI in the last 168 hours. BNP: Invalid input(s): POCBNP CBG: Recent Labs  Lab 10/05/18 1151 10/09/18 0543 10/09/18 0806 10/09/18 1147 10/09/18 1206  GLUCAP 243* 405* 199* 34* 88   D-Dimer No results for input(s): DDIMER in the last 72 hours. Hgb A1c No results for input(s): HGBA1C in the last 72 hours. Lipid Profile No results for input(s): CHOL, HDL, LDLCALC, TRIG, CHOLHDL, LDLDIRECT in the last 72 hours. Thyroid function studies No results for input(s): TSH, T4TOTAL, T3FREE, THYROIDAB in the last 72 hours.  Invalid input(s): FREET3 Anemia work up No results for input(s): VITAMINB12, FOLATE, FERRITIN, TIBC, IRON, RETICCTPCT in the last 72 hours. Urinalysis    Component Value Date/Time   COLORURINE COLORLESS (A) 10/03/2018 1651   APPEARANCEUR CLEAR 10/03/2018 1651   LABSPEC 1.004 (L) 10/03/2018 1651   PHURINE 8.0 10/03/2018 1651   GLUCOSEU NEGATIVE 10/03/2018 1651   HGBUR SMALL (A) 10/03/2018 1651   BILIRUBINUR NEGATIVE 10/03/2018 1651   KETONESUR NEGATIVE 10/03/2018 1651   PROTEINUR NEGATIVE 10/03/2018 1651   UROBILINOGEN 1.0 03/14/2015 0610   NITRITE NEGATIVE 10/03/2018 1651   LEUKOCYTESUR NEGATIVE 10/03/2018 1651   Sepsis Labs Invalid input(s):  PROCALCITONIN,  WBC,  LACTICIDVEN Microbiology No results found for this or any previous visit (from the past 240 hour(s)).   SIGNED:   Rickey Barbara, MD  [facility] 10/10/2018, 10:46 AM  If 7PM-7AM, please contact night-coverage \

## 2018-10-10 NOTE — Progress Notes (Signed)
Pt leaving AMA. Risks explained to patient. Telemetry removed, no IV access. MD notified, AMA form signed and placed in chart.   Joylene Grapes, RN, BSN

## 2018-10-10 NOTE — Progress Notes (Signed)
Patient IV was leaking. IV team paged to restart, but patient refused.

## 2018-10-10 NOTE — Progress Notes (Signed)
Patient did not sleep at all last night. He has been hallucinating "seeing snakes, having a snake in his throat, enemies in my room, they are out to get me, I need my schizophrenia medicine ". He is constantly asking for food and drinks.

## 2018-10-12 ENCOUNTER — Emergency Department (HOSPITAL_COMMUNITY): Payer: Medicare Other

## 2018-10-12 ENCOUNTER — Inpatient Hospital Stay (HOSPITAL_COMMUNITY)
Admission: EM | Admit: 2018-10-12 | Discharge: 2018-10-13 | DRG: 292 | Discharge status: Against Medical Advice | Payer: Medicare Other | Attending: Internal Medicine | Admitting: Internal Medicine

## 2018-10-12 ENCOUNTER — Other Ambulatory Visit: Payer: Self-pay

## 2018-10-12 ENCOUNTER — Encounter (HOSPITAL_COMMUNITY): Payer: Self-pay | Admitting: Emergency Medicine

## 2018-10-12 DIAGNOSIS — L0390 Cellulitis, unspecified: Secondary | ICD-10-CM | POA: Diagnosis present

## 2018-10-12 DIAGNOSIS — E876 Hypokalemia: Secondary | ICD-10-CM | POA: Diagnosis present

## 2018-10-12 DIAGNOSIS — Z9111 Patient's noncompliance with dietary regimen: Secondary | ICD-10-CM

## 2018-10-12 DIAGNOSIS — Z8249 Family history of ischemic heart disease and other diseases of the circulatory system: Secondary | ICD-10-CM

## 2018-10-12 DIAGNOSIS — F1010 Alcohol abuse, uncomplicated: Secondary | ICD-10-CM | POA: Diagnosis present

## 2018-10-12 DIAGNOSIS — Z833 Family history of diabetes mellitus: Secondary | ICD-10-CM | POA: Diagnosis not present

## 2018-10-12 DIAGNOSIS — Z9114 Patient's other noncompliance with medication regimen: Secondary | ICD-10-CM

## 2018-10-12 DIAGNOSIS — F17210 Nicotine dependence, cigarettes, uncomplicated: Secondary | ICD-10-CM | POA: Diagnosis present

## 2018-10-12 DIAGNOSIS — Z7982 Long term (current) use of aspirin: Secondary | ICD-10-CM | POA: Diagnosis not present

## 2018-10-12 DIAGNOSIS — R188 Other ascites: Secondary | ICD-10-CM | POA: Diagnosis present

## 2018-10-12 DIAGNOSIS — I5043 Acute on chronic combined systolic (congestive) and diastolic (congestive) heart failure: Secondary | ICD-10-CM

## 2018-10-12 DIAGNOSIS — F1410 Cocaine abuse, uncomplicated: Secondary | ICD-10-CM | POA: Diagnosis present

## 2018-10-12 DIAGNOSIS — I110 Hypertensive heart disease with heart failure: Principal | ICD-10-CM | POA: Diagnosis present

## 2018-10-12 DIAGNOSIS — N5089 Other specified disorders of the male genital organs: Secondary | ICD-10-CM

## 2018-10-12 DIAGNOSIS — I160 Hypertensive urgency: Secondary | ICD-10-CM

## 2018-10-12 DIAGNOSIS — N289 Disorder of kidney and ureter, unspecified: Secondary | ICD-10-CM

## 2018-10-12 DIAGNOSIS — D649 Anemia, unspecified: Secondary | ICD-10-CM | POA: Diagnosis present

## 2018-10-12 DIAGNOSIS — F200 Paranoid schizophrenia: Secondary | ICD-10-CM | POA: Diagnosis present

## 2018-10-12 DIAGNOSIS — Z590 Homelessness: Secondary | ICD-10-CM

## 2018-10-12 DIAGNOSIS — G4733 Obstructive sleep apnea (adult) (pediatric): Secondary | ICD-10-CM | POA: Diagnosis present

## 2018-10-12 DIAGNOSIS — Z79899 Other long term (current) drug therapy: Secondary | ICD-10-CM | POA: Diagnosis not present

## 2018-10-12 DIAGNOSIS — Z9119 Patient's noncompliance with other medical treatment and regimen: Secondary | ICD-10-CM | POA: Diagnosis not present

## 2018-10-12 DIAGNOSIS — B1920 Unspecified viral hepatitis C without hepatic coma: Secondary | ICD-10-CM | POA: Diagnosis present

## 2018-10-12 DIAGNOSIS — L03818 Cellulitis of other sites: Secondary | ICD-10-CM

## 2018-10-12 DIAGNOSIS — Z794 Long term (current) use of insulin: Secondary | ICD-10-CM

## 2018-10-12 DIAGNOSIS — E119 Type 2 diabetes mellitus without complications: Secondary | ICD-10-CM

## 2018-10-12 DIAGNOSIS — N4822 Cellulitis of corpus cavernosum and penis: Secondary | ICD-10-CM

## 2018-10-12 DIAGNOSIS — N485 Ulcer of penis: Secondary | ICD-10-CM | POA: Diagnosis present

## 2018-10-12 LAB — URINALYSIS, ROUTINE W REFLEX MICROSCOPIC
Bacteria, UA: NONE SEEN
Bilirubin Urine: NEGATIVE
Glucose, UA: 50 mg/dL — AB
Hgb urine dipstick: NEGATIVE
Ketones, ur: NEGATIVE mg/dL
Leukocytes,Ua: NEGATIVE
Nitrite: NEGATIVE
Protein, ur: 30 mg/dL — AB
Specific Gravity, Urine: 1.011 (ref 1.005–1.030)
pH: 7 (ref 5.0–8.0)

## 2018-10-12 LAB — CBC WITH DIFFERENTIAL/PLATELET
Abs Immature Granulocytes: 0.04 10*3/uL (ref 0.00–0.07)
Basophils Absolute: 0.1 10*3/uL (ref 0.0–0.1)
Basophils Relative: 1 %
Eosinophils Absolute: 0 10*3/uL (ref 0.0–0.5)
Eosinophils Relative: 0 %
HCT: 34.2 % — ABNORMAL LOW (ref 39.0–52.0)
Hemoglobin: 9.9 g/dL — ABNORMAL LOW (ref 13.0–17.0)
Immature Granulocytes: 0 %
Lymphocytes Relative: 8 %
Lymphs Abs: 0.8 10*3/uL (ref 0.7–4.0)
MCH: 23.6 pg — ABNORMAL LOW (ref 26.0–34.0)
MCHC: 28.9 g/dL — ABNORMAL LOW (ref 30.0–36.0)
MCV: 81.4 fL (ref 80.0–100.0)
Monocytes Absolute: 0.7 10*3/uL (ref 0.1–1.0)
Monocytes Relative: 7 %
Neutro Abs: 8.4 10*3/uL — ABNORMAL HIGH (ref 1.7–7.7)
Neutrophils Relative %: 84 %
Platelets: 313 10*3/uL (ref 150–400)
RBC: 4.2 MIL/uL — ABNORMAL LOW (ref 4.22–5.81)
RDW: 17.9 % — ABNORMAL HIGH (ref 11.5–15.5)
WBC: 10 10*3/uL (ref 4.0–10.5)
nRBC: 0 % (ref 0.0–0.2)

## 2018-10-12 LAB — COMPREHENSIVE METABOLIC PANEL
ALT: 24 U/L (ref 0–44)
AST: 44 U/L — ABNORMAL HIGH (ref 15–41)
Albumin: 2.8 g/dL — ABNORMAL LOW (ref 3.5–5.0)
Alkaline Phosphatase: 113 U/L (ref 38–126)
Anion gap: 10 (ref 5–15)
BUN: 23 mg/dL — ABNORMAL HIGH (ref 6–20)
CO2: 31 mmol/L (ref 22–32)
Calcium: 8.8 mg/dL — ABNORMAL LOW (ref 8.9–10.3)
Chloride: 98 mmol/L (ref 98–111)
Creatinine, Ser: 1.32 mg/dL — ABNORMAL HIGH (ref 0.61–1.24)
GFR calc Af Amer: >60 mL/min (ref >60)
GFR calc non Af Amer: 59 mL/min — ABNORMAL LOW (ref >60)
Glucose, Bld: 274 mg/dL — ABNORMAL HIGH (ref 70–99)
Potassium: 3.3 mmol/L — ABNORMAL LOW (ref 3.5–5.1)
Sodium: 139 mmol/L (ref 135–145)
Total Bilirubin: 0.8 mg/dL (ref 0.3–1.2)
Total Protein: 6.6 g/dL (ref 6.5–8.1)

## 2018-10-12 LAB — GLUCOSE, CAPILLARY
Glucose-Capillary: 283 mg/dL — ABNORMAL HIGH (ref 70–99)
Glucose-Capillary: 50 mg/dL — ABNORMAL LOW (ref 70–99)
Glucose-Capillary: 350 mg/dL — ABNORMAL HIGH (ref 70–99)
Glucose-Capillary: 191 mg/dL — ABNORMAL HIGH (ref 70–99)
Glucose-Capillary: 357 mg/dL — ABNORMAL HIGH (ref 70–99)

## 2018-10-12 LAB — BRAIN NATRIURETIC PEPTIDE: B Natriuretic Peptide: 1289.1 pg/mL — ABNORMAL HIGH (ref 0.0–100.0)

## 2018-10-12 MED ORDER — THIAMINE HCL 100 MG/ML IJ SOLN: 100.0000 mg | Freq: Every day | INTRAMUSCULAR | Status: DC

## 2018-10-12 MED ORDER — FUROSEMIDE 10 MG/ML IJ SOLN
160.0000 mg | Freq: Once | INTRAVENOUS | Status: AC
  Administered 2018-10-12: 160 mg via INTRAVENOUS
  Filled 2018-10-12: qty 16

## 2018-10-12 MED ORDER — PIPERACILLIN-TAZOBACTAM 3.375 G IVPB 30 MIN
3.3750 g | Freq: Four times a day (QID) | INTRAVENOUS | Status: DC
  Administered 2018-10-12: 3.375 g via INTRAVENOUS
  Filled 2018-10-12: qty 50

## 2018-10-12 MED ORDER — SODIUM CHLORIDE 0.9% FLUSH: 3.0000 mL | INTRAVENOUS | Status: DC | PRN

## 2018-10-12 MED ORDER — LORAZEPAM 1 MG PO TABS
1.0000 mg | ORAL_TABLET | Freq: Four times a day (QID) | ORAL | Status: DC | PRN
  Administered 2018-10-13: 1 mg via ORAL
  Filled 2018-10-12: qty 1

## 2018-10-12 MED ORDER — POTASSIUM CHLORIDE CRYS ER 20 MEQ PO TBCR
20.0000 meq | EXTENDED_RELEASE_TABLET | Freq: Two times a day (BID) | ORAL | Status: DC
  Administered 2018-10-12 (×3): 20 meq via ORAL
  Filled 2018-10-12 (×3): qty 1

## 2018-10-12 MED ORDER — ONDANSETRON HCL 4 MG/2ML IJ SOLN: 4.0000 mg | Freq: Four times a day (QID) | INTRAMUSCULAR | Status: DC | PRN

## 2018-10-12 MED ORDER — FUROSEMIDE 10 MG/ML IJ SOLN
40.0000 mg | Freq: Two times a day (BID) | INTRAMUSCULAR | Status: DC
  Administered 2018-10-12: 40 mg via INTRAVENOUS
  Filled 2018-10-12: qty 4

## 2018-10-12 MED ORDER — NICOTINE 21 MG/24HR TD PT24
21.0000 mg | MEDICATED_PATCH | Freq: Every day | TRANSDERMAL | Status: DC
  Administered 2018-10-12 – 2018-10-13 (×2): 21 mg via TRANSDERMAL
  Filled 2018-10-12 (×2): qty 1

## 2018-10-12 MED ORDER — ARIPIPRAZOLE 10 MG PO TABS
10.0000 mg | ORAL_TABLET | Freq: Every day | ORAL | Status: DC
  Administered 2018-10-12 – 2018-10-13 (×2): 10 mg via ORAL
  Filled 2018-10-12: qty 2
  Filled 2018-10-12 (×2): qty 1
  Filled 2018-10-12: qty 2

## 2018-10-12 MED ORDER — LORAZEPAM 2 MG/ML IJ SOLN: 1.0000 mg | Freq: Four times a day (QID) | INTRAMUSCULAR | Status: DC | PRN

## 2018-10-12 MED ORDER — FUROSEMIDE 10 MG/ML IJ SOLN
40.0000 mg | Freq: Once | INTRAMUSCULAR | Status: AC
  Administered 2018-10-12: 40 mg via INTRAVENOUS
  Filled 2018-10-12: qty 4

## 2018-10-12 MED ORDER — IOHEXOL 300 MG/ML  SOLN
100.0000 mL | Freq: Once | INTRAMUSCULAR | Status: AC | PRN
  Administered 2018-10-12: 100 mL via INTRAVENOUS

## 2018-10-12 MED ORDER — HYDRALAZINE HCL 20 MG/ML IJ SOLN
10.0000 mg | INTRAMUSCULAR | Status: DC | PRN
  Administered 2018-10-12: 10 mg via INTRAVENOUS
  Filled 2018-10-12: qty 1

## 2018-10-12 MED ORDER — LORAZEPAM 2 MG/ML IJ SOLN: 0.0000 mg | Freq: Four times a day (QID) | INTRAMUSCULAR | Status: DC

## 2018-10-12 MED ORDER — INSULIN ASPART 100 UNIT/ML ~~LOC~~ SOLN
0.0000 [IU] | SUBCUTANEOUS | Status: DC
  Administered 2018-10-12: 7 [IU] via SUBCUTANEOUS
  Administered 2018-10-12: 9 [IU] via SUBCUTANEOUS
  Administered 2018-10-12: 2 [IU] via SUBCUTANEOUS
  Administered 2018-10-12: 5 [IU] via SUBCUTANEOUS
  Administered 2018-10-13: 2 [IU] via SUBCUTANEOUS
  Administered 2018-10-13: 4 [IU] via SUBCUTANEOUS

## 2018-10-12 MED ORDER — ADULT MULTIVITAMIN W/MINERALS CH
1.0000 | ORAL_TABLET | Freq: Every day | ORAL | Status: DC
  Administered 2018-10-12 – 2018-10-13 (×2): 1 via ORAL
  Filled 2018-10-12 (×2): qty 1

## 2018-10-12 MED ORDER — SODIUM CHLORIDE 0.9 % IV SOLN: 250.0000 mL | INTRAVENOUS | Status: DC | PRN

## 2018-10-12 MED ORDER — CARBAMAZEPINE 200 MG PO TABS
100.0000 mg | ORAL_TABLET | Freq: Two times a day (BID) | ORAL | Status: DC
  Administered 2018-10-12 – 2018-10-13 (×3): 100 mg via ORAL
  Filled 2018-10-12 (×3): qty 0.5

## 2018-10-12 MED ORDER — PIPERACILLIN-TAZOBACTAM 3.375 G IVPB
3.3750 g | Freq: Three times a day (TID) | INTRAVENOUS | Status: DC
  Administered 2018-10-12 – 2018-10-13 (×3): 3.375 g via INTRAVENOUS
  Filled 2018-10-12 (×4): qty 50

## 2018-10-12 MED ORDER — VITAMIN B-1 100 MG PO TABS
100.0000 mg | ORAL_TABLET | Freq: Every day | ORAL | Status: DC
  Administered 2018-10-12 – 2018-10-13 (×2): 100 mg via ORAL
  Filled 2018-10-12 (×2): qty 1

## 2018-10-12 MED ORDER — ATORVASTATIN CALCIUM 40 MG PO TABS
40.0000 mg | ORAL_TABLET | Freq: Every day | ORAL | Status: DC
  Administered 2018-10-12: 40 mg via ORAL
  Filled 2018-10-12: qty 1

## 2018-10-12 MED ORDER — TAMSULOSIN HCL 0.4 MG PO CAPS
0.4000 mg | ORAL_CAPSULE | Freq: Every day | ORAL | Status: DC
  Administered 2018-10-12 – 2018-10-13 (×2): 0.4 mg via ORAL
  Filled 2018-10-12 (×2): qty 1

## 2018-10-12 MED ORDER — SODIUM CHLORIDE 0.9% FLUSH
3.0000 mL | Freq: Two times a day (BID) | INTRAVENOUS | Status: DC
  Administered 2018-10-12 (×3): 3 mL via INTRAVENOUS

## 2018-10-12 MED ORDER — ASPIRIN EC 81 MG PO TBEC
81.0000 mg | DELAYED_RELEASE_TABLET | Freq: Every day | ORAL | Status: DC
  Administered 2018-10-12 – 2018-10-13 (×2): 81 mg via ORAL
  Filled 2018-10-12 (×2): qty 1

## 2018-10-12 MED ORDER — LORAZEPAM 2 MG/ML IJ SOLN: 0.0000 mg | Freq: Two times a day (BID) | INTRAMUSCULAR | Status: DC

## 2018-10-12 MED ORDER — ACETAMINOPHEN 325 MG PO TABS
650.0000 mg | ORAL_TABLET | ORAL | Status: DC | PRN
  Administered 2018-10-12 – 2018-10-13 (×3): 650 mg via ORAL
  Filled 2018-10-12 (×3): qty 2

## 2018-10-12 MED ORDER — VANCOMYCIN HCL 10 G IV SOLR
1250.0000 mg | INTRAVENOUS | Status: DC
  Administered 2018-10-12: 1250 mg via INTRAVENOUS
  Filled 2018-10-12 (×2): qty 1250

## 2018-10-12 MED ORDER — FUROSEMIDE 10 MG/ML IJ SOLN
80.0000 mg | Freq: Two times a day (BID) | INTRAMUSCULAR | Status: DC
  Administered 2018-10-12 – 2018-10-13 (×2): 80 mg via INTRAVENOUS
  Filled 2018-10-12 (×2): qty 8

## 2018-10-12 MED ORDER — VANCOMYCIN HCL 10 G IV SOLR
1500.0000 mg | Freq: Once | INTRAVENOUS | Status: AC
  Administered 2018-10-12: 1500 mg via INTRAVENOUS
  Filled 2018-10-12 (×2): qty 1500

## 2018-10-12 MED ORDER — FOLIC ACID 1 MG PO TABS
1.0000 mg | ORAL_TABLET | Freq: Every day | ORAL | Status: DC
  Administered 2018-10-12 – 2018-10-13 (×2): 1 mg via ORAL
  Filled 2018-10-12 (×2): qty 1

## 2018-10-12 NOTE — ED Provider Notes (Signed)
[facility] EMERGENCY DEPARTMENT Provider Note   CSN: [id number] Arrival date & time: 10/12/18  0033    History   Chief Complaint Chief Complaint  Patient presents with   Testicle Swelling    HPI TYREQUE FINKEN is a 58 y.o. male.     Patient presents to the emergency department for evaluation of scrotal and penile pain.  Patient has a history of congestive heart failure with noncompliance.  Patient has been admitted multiple times recently for significant edema and anasarca.  He has left AGAINST MEDICAL ADVICE multiple times.  Patient reports that he is having increased pain in his penis.     Past Medical History:  Diagnosis Date   Chronic foot pain    Cocaine abuse (HCC)    Diabetes mellitus without complication (HCC)    Hepatitis C    unsure    Homelessness    Hypertension    Neuropathy    Polysubstance abuse (HCC)    Schizophrenia (HCC)    Sleep apnea    Systolic and diastolic CHF, chronic (HCC)     Patient Active Problem List   Diagnosis Date Noted   Adjustment disorder with mixed disturbance of emotions and conduct    Sleep apnea 10/03/2018   Hypokalemia 09/17/2018   Scrotal swelling 09/17/2018   Urinary hesitancy    Constipation    Acute on chronic combined systolic and diastolic CHF (congestive heart failure) (HCC) 08/25/2018   Acute respiratory failure with hypoxia (HCC) 08/25/2018   Acute on chronic combined systolic and diastolic congestive heart failure (HCC) 08/18/2018   Tobacco use 08/18/2018   Acute on chronic combined systolic (congestive) and diastolic (congestive) heart failure (HCC) 08/08/2018   Homelessness 08/08/2018   Smoker 08/08/2018   Acute exacerbation of CHF (congestive heart failure) (HCC) 03/28/2018   Prostate enlargement 03/16/2018   Aortic atherosclerosis (HCC) 03/16/2018   Aneurysm of abdominal aorta (HCC) 03/16/2018   Chronic foot pain    Schizoaffective disorder,  bipolar type (HCC) 09/30/2016   Substance induced mood disorder (HCC) 03/13/2015   Acute kidney failure (HCC) 01/26/2015   Schizophrenia, paranoid type (HCC) 01/17/2015   Suicidal ideation    Drug hallucinosis (HCC) 10/08/2014   Chronic paranoid schizophrenia (HCC) 09/07/2014   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 Authorizing Provider  ARIPiprazole (ABILIFY) 10 MG tablet Take 1 tablet (10 mg total) by mouth daily. 09/18/18   Meredeth Ide, MD  aspirin EC 81 MG tablet Take 1 tablet (81 mg total) by mouth daily for 30 days. 09/18/18 10/18/18  Meredeth Ide, MD  atorvastatin (LIPITOR) 40 MG tablet Take 1 tablet (40 mg total) by mouth daily. 09/18/18   Meredeth Ide, MD  bisacodyl (DULCOLAX) 5 MG EC tablet Take 1 tablet (5 mg total) by mouth daily as needed for moderate constipation. 09/04/18   Rodolph Bong, MD  carbamazepine (TEGRETOL) 200 MG tablet Take 0.5 tablets (100 mg total) by mouth 2 (two) times daily. 09/18/18   Meredeth Ide, MD  furosemide (LASIX)  40 MG tablet Take 1 tablet (40 mg total) by mouth daily. 09/24/18   Derwood Kaplan, MD  hydrocortisone (PROCTO-PAK) 1 % CREA Apply twice a day to hemorrhoids. 09/29/18   Renne Crigler, PA-C  Insulin Glargine (LANTUS) 100 UNIT/ML Solostar Pen Inject 14 Units into the skin daily. 09/18/18   Meredeth Ide, MD  Insulin Pen Needle 29G X MISC Use with insulin 09/18/18   Meredeth Ide, MD  lisinopril (PRINIVIL,ZESTRIL) 10 MG tablet Take 1 tablet (10 mg total) by mouth daily. 09/18/18    Meredeth Ide, MD  nicotine (NICODERM CQ - DOSED IN MG/24 HOURS) 21 mg/24hr patch Place 1 patch (21 mg total) onto the skin daily. 09/04/18   Rodolph Bong, MD  polyethylene glycol [facility] Hal) packet Take 17 g by mouth 2 (two) times daily. 09/18/18   Meredeth Ide, MD  Potassium Chloride ER 20 MEQ TBCR Take 20 mEq by mouth daily. 09/18/18   Meredeth Ide, MD  senna-docusate (SENOKOT-S) 8.6-50 MG tablet Take 1 tablet by mouth 2 (two) times daily. 09/04/18   Rodolph Bong, MD  tamsulosin [facility]) 0.4 MG CAPS capsule Take 1 capsule (0.4 mg total) by mouth daily after breakfast. 10/05/18   Narda Bonds, MD    Family History Family History  Problem Relation Age of Onset   Hypertension Other    Diabetes Other     Social History Social History   Tobacco Use   Smoking status: Current Every Day Smoker    Packs/day: 1.00    Years: 20.00    Pack years: 20.00    Types: Cigarettes   Smokeless tobacco: Current User  Substance Use Topics   Alcohol use: Yes    Comment: Daily Drinker    Drug use: Yes    Frequency: 7.0 times per week    Types: "Crack" cocaine, Cocaine, Marijuana     Allergies   Haldol [haloperidol]   Review of Systems Review of Systems  Respiratory: Positive for shortness of breath.   Genitourinary: Positive for penile swelling and scrotal swelling.  All other systems reviewed and are negative.    Physical Exam Updated Vital Signs BP (!) 165/101 (BP Location: Left Arm)    Pulse 98    Temp 98.8 F (37.1 C) (Oral)    Resp 18    Ht  (1.727 m)    Wt 97.1 kg    SpO2 100%    BMI 32.54 kg/m   Physical Exam Vitals signs and nursing note reviewed.  Constitutional:      General: He is not in acute distress.    Appearance: Normal appearance. He is well-developed.  HENT:     Head: Normocephalic and atraumatic.     Right Ear: Hearing normal.     Left Ear: Hearing normal.     Nose: Nose normal.  Eyes:     Conjunctiva/sclera: Conjunctivae  normal.     Pupils: Pupils are equal, round, and reactive to light.  Neck:     Musculoskeletal: Normal range of motion and neck supple.  Cardiovascular:     Rate and Rhythm: Regular rhythm.     Heart sounds: S1 normal and S2 normal. No murmur. No friction rub. No gallop.   Pulmonary:     Effort: Pulmonary effort is normal. No respiratory distress.     Breath sounds: Normal breath sounds.  Chest:     Chest wall: No tenderness.  Abdominal:     General: Bowel sounds  are normal.     Palpations: Abdomen is soft.     Tenderness: There is no abdominal tenderness. There is no guarding or rebound. Negative signs include Murphy's sign and McBurney's sign.     Hernia: No hernia is present.  Genitourinary:    Comments: Significant edema of scrotum, groin and penis with large area of ulceration on underside of penis with purulent drainage Musculoskeletal: Normal range of motion.     Right lower leg: Edema present.     Left lower leg: Edema present.  Skin:    General: Skin is warm and dry.     Findings: No rash.  Neurological:     Mental Status: He is alert and oriented to person, place, and time.     GCS: GCS eye subscore is 4. GCS verbal subscore is 5. GCS motor subscore is 6.     Cranial Nerves: No cranial nerve deficit.     Sensory: No sensory deficit.     Coordination: Coordination normal.  Psychiatric:        Speech: Speech normal.        Behavior: Behavior normal.        Thought Content: Thought content normal.      ED Treatments / Results  Labs (all labs ordered are listed, but only abnormal results are displayed) Labs Reviewed  CBC WITH DIFFERENTIAL/PLATELET - Abnormal; Notable for the following components:      Result Value   RBC 4.20 (*)    Hemoglobin 9.9 (*)    HCT 34.2 (*)    MCH 23.6 (*)    MCHC 28.9 (*)    RDW 17.9 (*)    Neutro Abs 8.4 (*)    All other components within normal limits  COMPREHENSIVE METABOLIC PANEL - Abnormal; Notable for the following  components:   Potassium 3.3 (*)    Glucose, Bld 274 (*)    BUN 23 (*)    Creatinine, Ser 1.32 (*)    Calcium 8.8 (*)    Albumin 2.8 (*)    AST 44 (*)    GFR calc non Af Amer 59 (*)    All other components within normal limits  BRAIN NATRIURETIC PEPTIDE - Abnormal; Notable for the following components:   B Natriuretic Peptide 1,289.1 (*)    All other components within normal limits  URINALYSIS, ROUTINE W REFLEX MICROSCOPIC - Abnormal; Notable for the following components:   Color, Urine STRAW (*)    Glucose, UA 50 (*)    Protein, ur 30 (*)    All other components within normal limits    EKG EKG Interpretation  Date/Time:  Monday October 12 2018 01:03:50 EDT Ventricular Rate:  100 PR Interval:    QRS Duration: 87 QT Interval:  367 QTC Calculation: 474 R Axis:   22 Text Interpretation:  Sinus tachycardia Probable left atrial enlargement Anterior infarct, old Nonspecific T abnormalities, lateral leads No significant change since last tracing Confirmed by Gilda Crease [phone]) on 10/12/2018 1:11:19 AM   Radiology Ct Abdomen Pelvis W Contrast  Result Date: 10/12/2018 CLINICAL DATA:  58 y/o M; bilateral testicular pain and swelling for 1 month. History of polysubstance abuse, hepatitis-C, diabetes. EXAM: CT ABDOMEN AND PELVIS WITH CONTRAST TECHNIQUE: Multidetector CT imaging of the abdomen and pelvis was performed using the standard protocol following bolus administration of intravenous contrast. CONTRAST:  OMNIPAQUE IOHEXOL 300 MG/ML  SOLN COMPARISON:  07/23/2018 CT abdomen and pelvis. FINDINGS: Lower chest: Increased moderate cardiomegaly. Stable metallic density within  the anterior aspect of right middle lobe. Hepatobiliary: No focal liver abnormality is seen. No gallstones or biliary ductal dilatation. Small volume of fluid within the gallbladder fossa is likely related to perihepatic ascites. Pancreas: Unremarkable. No pancreatic ductal dilatation or surrounding  inflammatory changes. Spleen: Normal in size without focal abnormality. Adrenals/Urinary Tract: Adrenal glands are unremarkable. Stable right kidney cysts measuring up to 4.0 cm arising from the lower pole. Kidneys are otherwise normal, without renal calculi, focal lesion, or hydronephrosis. Diffuse bladder wall thickening. Stomach/Bowel: Stomach is within normal limits. Appendix appears normal. No evidence of bowel wall thickening, distention, or inflammatory changes. Vascular/Lymphatic: Aortic atherosclerosis. No enlarged abdominal or pelvic lymph nodes. Reproductive: Stable severe enlargement of the prostate. Other: Small volume of peritoneal ascites. Diffuse edema throughout the subcutaneous fat predominantly of lower abdomen and visible pelvis. Musculoskeletal: No fracture is seen. Stable well-marginated sclerotic foci within the pelvis compatible with bone islands. IMPRESSION: 1. Small volume of ascites and diffuse edema throughout subcutaneous fat predominantly of lower abdomen and visible pelvis. Interval increased cardiomegaly favors heart failure as the etiology. 2. Stable prostate enlargement and bladder wall thickening likely due to chronic outflow obstruction. 3.  Aortic Atherosclerosis (ICD10-I70.0). Electronically Signed   By: Mitzi HansenLance  Furusawa-Stratton M.D.   On: 10/12/2018 01:49    Procedures Procedures (including critical care time)  Medications Ordered in ED Medications  piperacillin-tazobactam (ZOSYN) IVPB 3.375 g (3.375 g Intravenous New Bag/Given 10/12/18 0303)  vancomycin (VANCOCIN) 1,500 mg in sodium chloride 0.9 % 500 mL IVPB (has no administration in time range)  furosemide (LASIX) 160 mg in dextrose 5 % 50 mL IVPB (0 mg Intravenous Stopped 10/12/18 0226)  iohexol (OMNIPAQUE) 300 MG/ML solution 100 mL (100 mLs Intravenous Contrast Given 10/12/18 0127)     Initial Impression / Assessment and Plan / ED Course  I have reviewed the triage vital signs and the nursing  notes.  Pertinent labs & imaging results that were available during my care of the patient were reviewed by me and considered in my medical decision making (see chart for details).        Patient presents for evaluation of testicular swelling and penile pain.  Patient well-known to this ER for similar complaints.  He has a history of right heart failure and noncompliance with medications.  Hospitalized earlier this week for anasarca.  Repeat evaluation tonight reveals that his testicular and penile edema are worse.  Additionally, patient now has significant ulceration of the underside of his penis with foul-smelling purulent discharge.  This was not present 2 nights ago when he was admitted.  Patient underwent CT scan to evaluate for deep infection.  No evidence of pelvic abscess or Fournier's gangrene at this time.  Patient is a diabetic.  He is at significant risk for worsening.  Will require hospitalization for IV antibiotics and further diuresis.  Final Clinical Impressions(s) / ED Diagnoses   Final diagnoses:  Penile cellulitis    ED Discharge Orders    None       Gilda CreasePollina, Aleena Kirkeby J, MD 10/12/18 [phone]

## 2018-10-12 NOTE — H&P (Signed)
History and Physical    [name]:[id number] DOB: [birth year] DOA: 10/12/2018  PCP: Patient, No Pcp Per   Patient coming from: Home   Chief Complaint: Scrotal swelling and penile pain   HPI: [name] is a 58 y.o. male with medical history significant for schizophrenia, insulin-dependent diabetes mellitus, polysubstance abuse, hypertension, OSA, homelessness, and noncompliance with his treatment plan, now presenting to the emergency department for evaluation of testicular swelling and penile pain after leaving the hospital AMA on 10/10/2018.  Patient reports chronic swelling of his scrotum that has worsened recently, and also notes the insidious development of pain involving his penis.  He denies any fevers or chills.  Denies any drainage from his urethra, but reports some fluid coming from the ventral aspect of the shaft.  Patient was admitted to the hospital on 10/03/2018 with acute CHF, was noncompliant with discharge medications and was admitted again for the same on 10/09/2018, left AMA that same day, returned to the hospital a couple hours later, was admitted again for acute CHF, and again left AMA the following day. Now returns for evaluation of ongoing edema involving bilateral legs, lower abdomen, and scrotum, as well as new penile pain with maceration and drainage.   ED Course: Upon arrival to the ED, patient is found to be afebrile, saturating adequately on room air, slightly tachycardic, and hypertensive 276/111.  EKG features a sinus tachycardia with lateral T wave inversions that are similar to prior.  Chemistry panel is notable for potassium 3.3, glucose 274, and creatinine of 1.32.  CBC is notable for stable normocytic anemia.  BNP is elevated to 1289.  CT the abdomen and pelvis reveals small volume ascites and diffuse edema throughout the subcutaneous fat, predominantly of the lower abdomen and pelvis with interval increase in cardiomegaly, favoring heart failure as  the etiology.  Patient was started on vancomycin and Zosyn in the ED and given 160 mg of IV Lasix.  Blood pressure is improving with diuresis, and the patient will be admitted for ongoing evaluation and management.   Review of Systems:  All other systems reviewed and apart from HPI, are negative.  Past Medical History:  Diagnosis Date  . Chronic foot pain   . Cocaine abuse (HCC)   . Diabetes mellitus without complication (HCC)   . Hepatitis C    unsure   . Homelessness   . Hypertension   . Neuropathy   . Polysubstance abuse (HCC)   . Schizophrenia (HCC)   . Sleep apnea   . Systolic and diastolic CHF, chronic (HCC)     Past Surgical History:  Procedure Laterality Date  . MULTIPLE TOOTH EXTRACTIONS       reports that he has been smoking cigarettes. He has a 20.00 pack-year smoking history. He uses smokeless tobacco. He reports current alcohol use. He reports current drug use. Frequency: 7.00 times per week. Drugs: "Crack" cocaine, Cocaine, and Marijuana.  Allergies  Allergen Reactions  . Haldol [Haloperidol] Other (See Comments)    Muscle spasms, loss of voluntary movement. However, pt has taken Thorazine on multiple occasions with no adverse effects.     Family History  Problem Relation Age of Onset  . Hypertension Other   . Diabetes Other      Prior to Admission medications   Medication Sig Start Date End Date Taking? Authorizing Provider  ARIPiprazole (ABILIFY) 10 MG tablet Take 1 tablet (10 mg total) by mouth daily. 09/18/18   Meredeth Ide, MD  aspirin  EC 81 MG tablet Take 1 tablet (81 mg total) by mouth daily for 30 days. 09/18/18 10/18/18  Meredeth IdeLama, Gagan S, MD  atorvastatin (LIPITOR) 40 MG tablet Take 1 tablet (40 mg total) by mouth daily. 09/18/18   Meredeth IdeLama, Gagan S, MD  bisacodyl (DULCOLAX) 5 MG EC tablet Take 1 tablet (5 mg total) by mouth daily as needed for moderate constipation. 09/04/18   Rodolph Bonghompson, Daniel V, MD  carbamazepine (TEGRETOL) 200 MG tablet Take 0.5 tablets  (100 mg total) by mouth 2 (two) times daily. 09/18/18   Meredeth IdeLama, Gagan S, MD  furosemide (LASIX) 40 MG tablet Take 1 tablet (40 mg total) by mouth daily. 09/24/18   Derwood KaplanNanavati, Ankit, MD  hydrocortisone (PROCTO-PAK) 1 % CREA Apply twice a day to hemorrhoids. 09/29/18   Renne CriglerGeiple, Joshua, PA-C  Insulin Glargine (LANTUS) 100 UNIT/ML Solostar Pen Inject 14 Units into the skin daily. 09/18/18   Meredeth IdeLama, Gagan S, MD  Insulin Pen Needle 29G X 8MM MISC Use with insulin 09/18/18   Meredeth IdeLama, Gagan S, MD  lisinopril (PRINIVIL,ZESTRIL) 10 MG tablet Take 1 tablet (10 mg total) by mouth daily. 09/18/18   Meredeth IdeLama, Gagan S, MD  nicotine (NICODERM CQ - DOSED IN MG/24 HOURS) 21 mg/24hr patch Place 1 patch (21 mg total) onto the skin daily. 09/04/18   Rodolph Bonghompson, Daniel V, MD  polyethylene glycol [facility](MIRALAX / Ethelene HalGLYCOLAX) packet Take 17 g by mouth 2 (two) times daily. 09/18/18   Meredeth IdeLama, Gagan S, MD  Potassium Chloride ER 20 MEQ TBCR Take 20 mEq by mouth daily. 09/18/18   Meredeth IdeLama, Gagan S, MD  senna-docusate (SENOKOT-S) 8.6-50 MG tablet Take 1 tablet by mouth 2 (two) times daily. 09/04/18   Rodolph Bonghompson, Daniel V, MD  tamsulosin [facility](FLOMAX) 0.4 MG CAPS capsule Take 1 capsule (0.4 mg total) by mouth daily after breakfast. 10/05/18   Narda BondsNettey, Ralph A, MD    Physical Exam: Vitals:   10/12/18 0155 10/12/18 0227 10/12/18 0253 10/12/18 0300  BP: (!) 176/111 (!) 169/111 (!) 165/101 (!) 162/98  Pulse: (!) 101 (!) 102 98 (!) 102  Resp: 18 18 18 16   Temp:      TempSrc:      SpO2: 100% 100% 100% 99%  Weight:      Height:        Constitutional: NAD, calm  Eyes: PERTLA, lids and conjunctivae normal ENMT: Mucous membranes are moist. Posterior pharynx clear of any exudate or lesions.   Neck: normal, supple, no masses, no thyromegaly Respiratory: no wheezing, no rhonchi. No accessory muscle use.  Cardiovascular: S1 & S2 heard, regular rate and rhythm. Pitting edema involving bilateral LE's with edema extending into scrotum and lower abdomen. Abdomen: No distension, no  tenderness, soft. Bowel sounds active.  Musculoskeletal: no clubbing / cyanosis. No joint deformity upper and lower extremities.    Skin: Induration, maceration, and thick drainage from ventral penis. Warm, dry, well-perfused. Neurologic: No gross facial asymmetry. Sensation intact. Moving all extremities.  Psychiatric: Sleeping, easily woken. Oriented to person, place, and situation.    Labs on Admission: I have personally reviewed following labs and imaging studies  CBC: Recent Labs  Lab 10/06/18 0923 10/09/18 0056 10/09/18 1746 10/10/18 0310 10/12/18 0102  WBC 9.2 9.7 10.4 8.3 10.0  NEUTROABS  --   --   --   --  8.4*  HGB 9.8* 10.4* 9.7* 9.9* 9.9*  HCT 32.6* 35.1* 32.4* 32.4* 34.2*  MCV 82.7 83.0 81.2 81.0 81.4  PLT 303 335 328 287 313   Basic  Metabolic Panel: Recent Labs  Lab 10/05/18 1113 10/06/18 0923 10/09/18 0056 10/09/18 1746 10/10/18 0310 10/12/18 0102  NA 138 137 135  --  137 139  K 3.8 4.1 3.8  --  3.9 3.3*  CL 101 100 98  --  99 98  CO2 --  27 31  GLUCOSE 266* 431* 383*  --  275* 274*  BUN 20 18 22*  --  26* 23*  CREATININE 1.02 1.12 1.11 1.09 1.17 1.32*  CALCIUM 8.6* 8.8* 9.1  --  9.1 8.8*   GFR: Estimated Creatinine Clearance: 69.8 mL/min (A) (by C-G formula based on SCr of 1.32 mg/dL (H)). Liver Function Tests: Recent Labs  Lab 10/09/18 0056 10/10/18 0310 10/12/18 0102  AST 37 33 44*  ALT ALKPHOS 125 108 113  BILITOT 0.4 0.8 0.8  PROT 7.4 6.7 6.6  ALBUMIN 2.9* 2.7* 2.8*   No results for input(s): LIPASE, AMYLASE in the last 168 hours. No results for input(s): AMMONIA in the last 168 hours. Coagulation Profile: Recent Labs  Lab 10/09/18 0056  INR 1.2   Cardiac Enzymes: No results for input(s): CKTOTAL, CKMB, CKMBINDEX, TROPONINI in the last 168 hours. BNP (last 3 results) No results for input(s): PROBNP in the last 8760 hours. HbA1C: No results for input(s): HGBA1C in the last 72 hours. CBG: Recent Labs  Lab  10/05/18 1151 10/09/18 0543 10/09/18 0806 10/09/18 1147 10/09/18 1206  GLUCAP 243* 405* 199* 34* 88   Lipid Profile: No results for input(s): CHOL, HDL, LDLCALC, TRIG, CHOLHDL, LDLDIRECT in the last 72 hours. Thyroid Function Tests: No results for input(s): TSH, T4TOTAL, FREET4, T3FREE, THYROIDAB in the last 72 hours. Anemia Panel: No results for input(s): VITAMINB12, FOLATE, FERRITIN, TIBC, IRON, RETICCTPCT in the last 72 hours. Urine analysis:    Component Value Date/Time   COLORURINE STRAW (A) 10/12/2018 0156   APPEARANCEUR CLEAR 10/12/2018 0156   LABSPEC 1.011 10/12/2018 0156   PHURINE 7.0 10/12/2018 0156   GLUCOSEU 50 (A) 10/12/2018 0156   HGBUR NEGATIVE 10/12/2018 0156   BILIRUBINUR NEGATIVE 10/12/2018 0156   KETONESUR NEGATIVE 10/12/2018 0156   PROTEINUR 30 (A) 10/12/2018 0156   UROBILINOGEN 1.0 03/14/2015 0610   NITRITE NEGATIVE 10/12/2018 0156   LEUKOCYTESUR NEGATIVE 10/12/2018 0156   Sepsis Labs: (procalcitonin:4,lacticidven:4) )No results found for this or any previous visit (from the past 240 hour(s)).   Radiological Exams on Admission: Ct Abdomen Pelvis W Contrast  Result Date: 10/12/2018 CLINICAL DATA:  58 y/o M; bilateral testicular pain and swelling for 1 month. History of polysubstance abuse, hepatitis-C, diabetes. EXAM: CT ABDOMEN AND PELVIS WITH CONTRAST TECHNIQUE: Multidetector CT imaging of the abdomen and pelvis was performed using the standard protocol following bolus administration of intravenous contrast. CONTRAST:  OMNIPAQUE IOHEXOL 300 MG/ML  SOLN COMPARISON:  07/23/2018 CT abdomen and pelvis. FINDINGS: Lower chest: Increased moderate cardiomegaly. Stable metallic density within the anterior aspect of right middle lobe. Hepatobiliary: No focal liver abnormality is seen. No gallstones or biliary ductal dilatation. Small volume of fluid within the gallbladder fossa is likely related to perihepatic ascites. Pancreas: Unremarkable. No  pancreatic ductal dilatation or surrounding inflammatory changes. Spleen: Normal in size without focal abnormality. Adrenals/Urinary Tract: Adrenal glands are unremarkable. Stable right kidney cysts measuring up to 4.0 cm arising from the lower pole. Kidneys are otherwise normal, without renal calculi, focal lesion, or hydronephrosis. Diffuse bladder wall thickening. Stomach/Bowel: Stomach is within normal limits. Appendix appears normal. No evidence of  bowel wall thickening, distention, or inflammatory changes. Vascular/Lymphatic: Aortic atherosclerosis. No enlarged abdominal or pelvic lymph nodes. Reproductive: Stable severe enlargement of the prostate. Other: Small volume of peritoneal ascites. Diffuse edema throughout the subcutaneous fat predominantly of lower abdomen and visible pelvis. Musculoskeletal: No fracture is seen. Stable well-marginated sclerotic foci within the pelvis compatible with bone islands. IMPRESSION: 1. Small volume of ascites and diffuse edema throughout subcutaneous fat predominantly of lower abdomen and visible pelvis. Interval increased cardiomegaly favors heart failure as the etiology. 2. Stable prostate enlargement and bladder wall thickening likely due to chronic outflow obstruction. 3.  Aortic Atherosclerosis (ICD10-I70.0). Electronically Signed   By: Mitzi Hansen M.D.   On: 10/12/2018 01:49    EKG: Independently reviewed. Sinus tachycardia (rate 100), lateral T-wave inversions are also seen on prior.   Assessment/Plan   1. Acute on chronic combined systolic & diastolic CHF  - Presents with increase in his chronic scrotal swelling, as well as penile pain  - Found to be markedly hypervolemic, but fortunately not in respiratory distress  - Echo from last month with EF 25-30%, moderate LVH, global HK, and grade 3 diastolic dysfunction  - He was started on IV Lasix in ED and is diuresing  - Continue diuresis with Lasix 40 mg IV q12h, follow daily wt and I/O's,  hold lisinopril in light of increased creatinine, and monitor renal function and electrolytes    2. Cellulitis  - Patient complains of scrotal and penile pain  - Scrotum swelling is chronic and related to his poorly-controlled heart failure, and there is maceration noted that involves penis and explains the pain  - There is thick drainage and foul odor concerning for infection; no abscess or evidence for Fournier gangrene on CT  - Given the severity and location, and in light of his poorly controlled DM, he was started on vancomycin and Zosyn in ED  - Continue current antibiotics, keep NPO for now as may need to be evaluated for debridement if worsens    3. Insulin-dependent DM  - A1c was 7.3% in January  - SSI with Novolog while in hospital   4. Mild renal insufficiency  - SCr is 1.32 on admission, up from apparent baseline of ~1  - Follow closely with daily chem panel during diuresis  5. Schizophrenia  - Denies SI, HI, or hallucinations in ED   - Continue Abilify and Tegretol    6. Non-compliance  - Patient acknowledges non-compliance with medications and habit of leaving hospital AMA, reports he is planning to stay this admission  - Mental illness and substance abuse likely contributing  - Social work consulted for any possible resources    7. Tobacco, alcohol, and cocaine abuse  - Counseled, though patient not very receptive  - Nicotine patch offered  - Monitor with CIWA, supplement vitamins    8. Hypokalemia  - Potassium 3.3, likely secondary to recent diuresis - Start oral potassium, follow chemistries while diuresing    9. Hypertension with hypertensive urgency  - BP 180/110 range in ED without chest pain or headache  - Improving with diuresis  - Hold lisinopril in light of increased creatinine, continue diuresis, and use  hydralazine IVP's as needed    PPE: Mask, face shield, gloves DVT prophylaxis: SCD's  Code Status: Full  Family Communication: Discussed with  patient  Consults called: None  Admission status: Inpatient. I certified that this patient will require inpatient services due to the complexity and severity of his condition with acute  CHF (EF only 25-30% with grade 3 diastolic dysfunction), [name], and severe cellulitis involving penis.    Briscoe Deutscher, MD [facility] Pager [phone]  If 7PM-7AM, please contact night-coverage [email or web address] Password [facility]  10/12/2018, 3:33 AM

## 2018-10-12 NOTE — ED Notes (Signed)
Attempted to call report

## 2018-10-12 NOTE — ED Triage Notes (Signed)
Patient arrived with EMS , pt. reports chronic bilateral testicular edema/swelling , respirations unlabored .

## 2018-10-12 NOTE — Progress Notes (Signed)
PROGRESS NOTE    [name]  ZOX:[id number] DOB: [birth year] DOA: 10/12/2018 PCP: Patient, No Pcp Per    Brief Narrative:  58 y.o. male with medical history significant for schizophrenia, insulin-dependent diabetes mellitus, polysubstance abuse, hypertension, OSA, homelessness, and noncompliance with his treatment plan, now presenting to the emergency department for evaluation of testicular swelling and penile pain after leaving the hospital AMA on 10/10/2018.  Patient reports chronic swelling of his scrotum that has worsened recently, and also notes the insidious development of pain involving his penis.  He denies any fevers or chills.  Denies any drainage from his urethra, but reports some fluid coming from the ventral aspect of the shaft.  Patient was admitted to the hospital on 10/03/2018 with acute CHF, was noncompliant with discharge medications and was admitted again for the same on 10/09/2018, left AMA that same day, returned to the hospital a couple hours later, was admitted again for acute CHF, and again left AMA the following day. Now returns for evaluation of ongoing edema involving bilateral legs, lower abdomen, and scrotum, as well as new penile pain with maceration and drainage.   ED Course: Upon arrival to the ED, patient is found to be afebrile, saturating adequately on room air, slightly tachycardic, and hypertensive 276/111.  EKG features a sinus tachycardia with lateral T wave inversions that are similar to prior.  Chemistry panel is notable for potassium 3.3, glucose 274, and creatinine of 1.32.  CBC is notable for stable normocytic anemia.  BNP is elevated to 1289.  CT the abdomen and pelvis reveals small volume ascites and diffuse edema throughout the subcutaneous fat, predominantly of the lower abdomen and pelvis with interval increase in cardiomegaly, favoring heart failure as the etiology.  Patient was started on vancomycin and Zosyn in the ED and given 160 mg of IV Lasix.   Blood pressure is improving with diuresis, and the patient will be admitted for ongoing evaluation and management.  Assessment & Plan:   Principal Problem:   Acute on chronic combined systolic and diastolic CHF (congestive heart failure) (HCC) Active Problems:   Insulin-requiring or dependent type II diabetes mellitus (HCC)   Schizophrenia, paranoid type (HCC)   Hypokalemia   Scrotal swelling   Hypertensive urgency   Mild renal insufficiency   Cellulitis  1. Acute on chronic combined systolic & diastolic CHF  - Markedly volume overloaded on presentation likely secondary to gross medical noncompliance\ - Presents with increase in his chronic scrotal swelling, as well as penile pain  - Echo from last month with EF 25-30%, moderate LVH, global HK, and grade 3 diastolic dysfunction  - Continued on IV lasix  BID. Will increase to  IV BID  2. Cellulitis  - Patient complains of scrotal and penile pain  - Scrotum swelling is chronic and related to his poorly-controlled heart failure - There is thick drainage and foul odor concerning for infection; no abscess or evidence for Fournier gangrene on CT  - Given the severity and location, and in light of his poorly controlled DM, he was started on vancomycin and Zosyn in ED  - Continue current antibiotics, keep NPO for now as may need to be evaluated for debridement if worsens    3. Insulin-dependent DM  - A1c was 7.3% in January  - SSI with Novolog while in hospital  - Patient non-compliant with diet, pt wanted ice cream this AM  4. Mild renal insufficiency  - SCr is 1.32 on admission, up from apparent baseline  of ~1  - Increased lasix per above. Repeat bmet in AM  5. Schizophrenia  - Denies SI, HI, or hallucinations in ED   - Continue Abilify and Tegretol    6. Non-compliance  - Patient acknowledges non-compliance with medications and habit of leaving hospital AMA, reports he is planning to stay this admission  - Mental  illness and substance abuse likely contributing   7. Tobacco, alcohol, and cocaine abuse  - Counseled, though patient not very receptive  - Nicotine patch offered  - Monitor with CIWA, supplement vitamins    8. Hypokalemia  - Potassium 3.3, likely secondary to recent diuresis - Replaced, follow chemistries while diuresing    9. Hypertension with hypertensive urgency  - BP 180/110 range in ED without chest pain or headache  - Improving with diuresis  - Hold lisinopril in light of increased creatinine, continue diuresis, and use  hydralazine IVP's as needed - BP stable   DVT prophylaxis: SCD's Code Status: Full Family Communication: Pt in room, family not at bedside Disposition Plan: Uncertain at this time  Consultants:     Procedures:     Antimicrobials: Anti-infectives (From admission, onward)   Start     Dose/Rate Route Frequency Ordered Stop   10/12/18 2200  vancomycin (VANCOCIN) 1,250 mg in sodium chloride 0.9 % 250 mL IVPB     1,250 mg 166.7 mL/hr over 90 Minutes Intravenous Every 24 hours 10/12/18 0347     10/12/18 1400  piperacillin-tazobactam (ZOSYN) IVPB 3.375 g     3.375 g 12.5 mL/hr over 240 Minutes Intravenous Every 8 hours 10/12/18 0347     10/12/18 0600  piperacillin-tazobactam (ZOSYN) IVPB 3.375 g  Status:  Discontinued     3.375 g 100 mL/hr over 30 Minutes Intravenous Every 6 hours 10/12/18 0257 10/12/18 0347   10/12/18 0315  vancomycin (VANCOCIN) 1,500 mg in sodium chloride 0.9 % 500 mL IVPB     1,500 mg 250 mL/hr over 120 Minutes Intravenous  Once 10/12/18 0304 10/12/18 0515       Subjective: Wants double portions of meals, drinking out of faucet, wants ice cream  Objective: Vitals:   10/12/18 0300 10/12/18 0350 10/12/18 0430 10/12/18 1300  BP: (!) 162/98 (!) 164/72 (!) 172/106 (!) 141/75  Pulse: (!) 102 94 97 (!) 102  Resp: 16 18    Temp:    98 F (36.7 C)  TempSrc:   Oral Oral  SpO2: 99% 98% 97% 100%  Weight:   88.5 kg   Height:     (1.727 m)     Intake/Output Summary (Last 24 hours) at 10/12/2018 1611 Last data filed at 10/12/2018 1400 Gross per 24 hour  Intake 1613 ml  Output 6251 ml  Net -4638 ml   Filed Weights   10/12/18 0048 10/12/18 0430  Weight: 97.1 kg 88.5 kg    Examination:  General exam: Appears calm and comfortable  Respiratory system: Clear to auscultation. Respiratory effort normal. Cardiovascular system: S1 & S2 heard, RRR Gastrointestinal system: Abdomen is nondistended, soft and nontender. No organomegaly or masses felt. Normal bowel sounds heard. Central nervous system: Alert and oriented. No focal neurological deficits. Extremities: Symmetric 5 x 5 power. Skin: No rashes, lesions Psychiatry: Judgement and insight appear normal. Mood & affect appropriate.   Data Reviewed: I have personally reviewed following labs and imaging studies  CBC: Recent Labs  Lab 10/06/18 0923 10/09/18 0056 10/09/18 1746 10/10/18 0310 10/12/18 0102  WBC 9.2 9.7 10.4 8.3 10.0  NEUTROABS  --   --   --   --  8.4*  HGB 9.8* 10.4* 9.7* 9.9* 9.9*  HCT 32.6* 35.1* 32.4* 32.4* 34.2*  MCV 82.7 83.0 81.2 81.0 81.4  PLT 303 335 328 287 313   Basic Metabolic Panel: Recent Labs  Lab 10/06/18 0923 10/09/18 0056 10/09/18 1746 10/10/18 0310 10/12/18 0102  NA 137 135  --  137 139  K 4.1 3.8  --  3.9 3.3*  CL 100 98  --  99 98  CO2 31 29  --  27 31  GLUCOSE 431* 383*  --  275* 274*  BUN 18 22*  --  26* 23*  CREATININE 1.12 1.11 1.09 1.17 1.32*  CALCIUM 8.8* 9.1  --  9.1 8.8*   GFR: Estimated Creatinine Clearance: 66.7 mL/min (A) (by C-G formula based on SCr of 1.32 mg/dL (H)). Liver Function Tests: Recent Labs  Lab 10/09/18 0056 10/10/18 0310 10/12/18 0102  AST 37 33 44*  ALT 23 20 24   ALKPHOS 125 108 113  BILITOT 0.4 0.8 0.8  PROT 7.4 6.7 6.6  ALBUMIN 2.9* 2.7* 2.8*   No results for input(s): LIPASE, AMYLASE in the last 168 hours. No results for input(s): AMMONIA in the last 168 hours.  Coagulation Profile: Recent Labs  Lab 10/09/18 0056  INR 1.2   Cardiac Enzymes: No results for input(s): CKTOTAL, CKMB, CKMBINDEX, TROPONINI in the last 168 hours. BNP (last 3 results) No results for input(s): PROBNP in the last 8760 hours. HbA1C: No results for input(s): HGBA1C in the last 72 hours. CBG: Recent Labs  Lab 10/09/18 1147 10/09/18 1206 10/12/18 0459 10/12/18 0751 10/12/18 1132  GLUCAP 34* 88 283* 50* 350*   Lipid Profile: No results for input(s): CHOL, HDL, LDLCALC, TRIG, CHOLHDL, LDLDIRECT in the last 72 hours. Thyroid Function Tests: No results for input(s): TSH, T4TOTAL, FREET4, T3FREE, THYROIDAB in the last 72 hours. Anemia Panel: No results for input(s): VITAMINB12, FOLATE, FERRITIN, TIBC, IRON, RETICCTPCT in the last 72 hours. Sepsis Labs: No results for input(s): PROCALCITON, LATICACIDVEN in the last 168 hours.  No results found for this or any previous visit (from the past 240 hour(s)).   Radiology Studies: Ct Abdomen Pelvis W Contrast  Result Date: 10/12/2018 CLINICAL DATA:  58 y/o M; bilateral testicular pain and swelling for 1 month. History of polysubstance abuse, hepatitis-C, diabetes. EXAM: CT ABDOMEN AND PELVIS WITH CONTRAST TECHNIQUE: Multidetector CT imaging of the abdomen and pelvis was performed using the standard protocol following bolus administration of intravenous contrast. CONTRAST:  OMNIPAQUE IOHEXOL 300 MG/ML  SOLN COMPARISON:  07/23/2018 CT abdomen and pelvis. FINDINGS: Lower chest: Increased moderate cardiomegaly. Stable metallic density within the anterior aspect of right middle lobe. Hepatobiliary: No focal liver abnormality is seen. No gallstones or biliary ductal dilatation. Small volume of fluid within the gallbladder fossa is likely related to perihepatic ascites. Pancreas: Unremarkable. No pancreatic ductal dilatation or surrounding inflammatory changes. Spleen: Normal in size without focal abnormality. Adrenals/Urinary  Tract: Adrenal glands are unremarkable. Stable right kidney cysts measuring up to 4.0 cm arising from the lower pole. Kidneys are otherwise normal, without renal calculi, focal lesion, or hydronephrosis. Diffuse bladder wall thickening. Stomach/Bowel: Stomach is within normal limits. Appendix appears normal. No evidence of bowel wall thickening, distention, or inflammatory changes. Vascular/Lymphatic: Aortic atherosclerosis. No enlarged abdominal or pelvic lymph nodes. Reproductive: Stable severe enlargement of the prostate. Other: Small volume of peritoneal ascites. Diffuse edema throughout the subcutaneous fat predominantly of lower abdomen and visible pelvis. Musculoskeletal: No fracture is seen. Stable well-marginated sclerotic foci within the  pelvis compatible with bone islands. IMPRESSION: 1. Small volume of ascites and diffuse edema throughout subcutaneous fat predominantly of lower abdomen and visible pelvis. Interval increased cardiomegaly favors heart failure as the etiology. 2. Stable prostate enlargement and bladder wall thickening likely due to chronic outflow obstruction. 3.  Aortic Atherosclerosis (ICD10-I70.0). Electronically Signed   By: Mitzi HansenLance  Furusawa-Stratton M.D.   On: 10/12/2018 01:49    Scheduled Meds: . ARIPiprazole  10 mg Oral Daily  . aspirin EC  81 mg Oral Daily  . atorvastatin  40 mg Oral q1800  . carbamazepine  100 mg Oral BID  . folic acid  1 mg Oral Daily  . furosemide  40 mg Intravenous Once  . furosemide  80 mg Intravenous BID  . insulin aspart  0-9 Units Subcutaneous Q4H  . LORazepam  0-4 mg Intravenous Q6H   Followed by  . [START ON 10/14/2018] LORazepam  0-4 mg Intravenous Q12H  . multivitamin with minerals  1 tablet Oral Daily  . nicotine  21 mg Transdermal Daily  . potassium chloride  20 mEq Oral BID  . sodium chloride flush  3 mL Intravenous Q12H  . tamsulosin  0.4 mg Oral QPC breakfast  . thiamine  100 mg Oral Daily   Or  . thiamine  100 mg Intravenous  Daily   Continuous Infusions: . sodium chloride    . piperacillin-tazobactam (ZOSYN)  IV 3.375 g (10/12/18 1411)  . vancomycin       LOS: 0 days   Rickey BarbaraStephen Jasiel Belisle, MD [facility] Pager On Amion  If 7PM-7AM, please contact night-coverage 10/12/2018, 4:11 PM

## 2018-10-12 NOTE — Progress Notes (Addendum)
Inpatient Diabetes Program Recommendations  AACE/ADA: New Consensus Statement on Inpatient Glycemic Control (2015)  Target Ranges:  Prepandial:   less than 140 mg/dL      Peak postprandial:   less than 180 mg/dL (1-2 hours)      Critically ill patients:  140 - 180 mg/dL   Lab Results  Component Value Date   GLUCAP 50 (L) 10/12/2018   HGBA1C 7.3 (H) 07/18/2018    Review of Glycemic Control Results for SELLERS, BELD (MRN [id number]) as of 10/12/2018 11:29  Ref. Range 10/12/2018 04:59 10/12/2018 07:51  Glucose-Capillary Latest Ref Range: 70 - 99 mg/dL 680 (H) 50 (L)   Diabetes history: Type 2 DM Outpatient Diabetes medications: Lantus 14 units QD Current orders for Inpatient glycemic control: Novolog 0-9 units Q4H  Inpatient Diabetes Program Recommendations:    Noted hypoglycemia of 50 mg/dL following Novolog 5 units x 1. Have reached out to RN to determine use of hypoglycemia protocol. CBG 350 mg/dL, most likely overcorrected. Last A1C was from January. Repeat A1C? Consider changing correction to Novolog 0-9 units TID now that patient is eating.   Also, would consider starting portion of Lantus back to help prevent patient from receiving larger doses of SSI, which could contribute to AM hypo. Consider Lantus 10 units QD to start now.   Thanks, Lujean Rave, MSN, RNC-OB Diabetes Coordinator [phone] (8a-5p)

## 2018-10-12 NOTE — ED Triage Notes (Signed)
[facility] reports: Patient complaining of testicle pain for the past month. Per EMS, patient endorsed cocaine use today.

## 2018-10-12 NOTE — Progress Notes (Signed)
Pharmacy Antibiotic Note  [name] is a 58 y.o. male admitted on 10/12/2018 with wound infection.  Pharmacy has been consulted for Vancomycin/Zosyn dosing. WBC WNL. Mild bump in SCr. Ulceration to the underside of the penis. Significant scrotal and penile swelling.   Plan: Vancomycin 1500 mg IV x 1, then give 1250 mg IV q24 >>Estimated AUC: 474 Zosyn 3.375G IV q8h to be infused over 4 hours Trend WBC, temp, renal function  F/U infectious work-up Drug levels as indicated   Height: 5\' 8"  (172.7 cm) Weight: 214 lb (97.1 kg) IBW/kg (Calculated) : 68.4  Temp (24hrs), Avg:98.8 F (37.1 C), Min:98.8 F (37.1 C), Max:98.8 F (37.1 C)  Recent Labs  Lab 10/06/18 0923 10/09/18 0056 10/09/18 1746 10/10/18 0310 10/12/18 0102  WBC 9.2 9.7 10.4 8.3 10.0  CREATININE 1.12 1.11 1.09 1.17 1.32*    Estimated Creatinine Clearance: 69.8 mL/min (A) (by C-G formula based on SCr of 1.32 mg/dL (H)).    Allergies  Allergen Reactions  . Haldol [Haloperidol] Other (See Comments)    Muscle spasms, loss of voluntary movement. However, pt has taken Thorazine on multiple occasions with no adverse effects.      [name] 10/12/2018 3:43 AM

## 2018-10-12 NOTE — ED Notes (Signed)
ED TO INPATIENT HANDOFF REPORT  ED Nurse Name and Phone #:  Molly Maduro RN [phone]  S Name/Age/Gender [name] 58 y.o. male Room/Bed: 020C/020C  Code Status   Code Status: Prior  Home/SNF/Other Home {Patient oriented to: Person/Time/Place/Situation  Is this baseline? Yes   Triage Complete: Triage complete  Chief Complaint groin swelling   Triage Note [facility] reports: Patient complaining of testicle pain for the past month. Per EMS, patient endorsed cocaine use today.   Patient arrived with EMS , pt. reports chronic bilateral testicular edema/swelling , respirations unlabored .    Allergies Allergies  Allergen Reactions  . Haldol [Haloperidol] Other (See Comments)    Muscle spasms, loss of voluntary movement. However, pt has taken Thorazine on multiple occasions with no adverse effects.     Level of Care/Admitting Diagnosis ED Disposition    ED Disposition Condition Comment   Admit  Hospital Area: [facility] [[id number]]  Level of Care: Telemetry Cardiac [103]  Diagnosis: Acute on chronic combined systolic and diastolic CHF (congestive heart failure) [place]) [[id number]]  Admitting Physician: Briscoe Deutscher [[id number]]  Attending Physician: Briscoe Deutscher [[id number]]  Estimated length of stay: past midnight tomorrow  Certification:: I certify this patient will need inpatient services for at least 2 midnights  PT Class (Do Not Modify): Inpatient [101]  PT Acc Code (Do Not Modify): Private [1]       B Medical/Surgery History Past Medical History:  Diagnosis Date  . Chronic foot pain   . Cocaine abuse (HCC)   . Diabetes mellitus without complication (HCC)   . Hepatitis C    unsure   . Homelessness   . Hypertension   . Neuropathy   . Polysubstance abuse (HCC)   . Schizophrenia (HCC)   . Sleep apnea   . Systolic and diastolic CHF, chronic (HCC)    Past Surgical History:  Procedure Laterality Date  . MULTIPLE TOOTH EXTRACTIONS       A IV  Location/Drains/Wounds Patient Lines/Drains/Airways Status   Active Line/Drains/Airways    Name:   Placement date:   Placement time:   Site:   Days:   Peripheral IV 10/12/18 Left;Upper Arm   10/12/18    0101    Arm   less than 1   External Urinary Catheter   10/12/18    0149    -   less than 1   Wound / Incision (Open or Dehisced) 10/04/18 Non-pressure wound Penis Right Open wound with yellow base and no drainage.   10/04/18    1859    Penis   8          Intake/Output Last 24 hours  Intake/Output Summary (Last 24 hours) at 10/12/2018 0326 Last data filed at 10/12/2018 0258 Gross per 24 hour  Intake -  Output 2750 ml  Net -2750 ml    Labs/Imaging Results for orders placed or performed during the hospital encounter of 10/12/18 (from the past 48 hour(s))  CBC with Differential/Platelet     Status: Abnormal   Collection Time: 10/12/18  1:02 AM  Result Value Ref Range   WBC 10.0 4.0 - 10.5 K/uL   RBC 4.20 (L) 4.22 - 5.81 MIL/uL   Hemoglobin 9.9 (L) 13.0 - 17.0 g/dL   HCT 08.6 (L) 57.8 - 46.9 %   MCV 81.4 80.0 - 100.0 fL   MCH 23.6 (L) 26.0 - 34.0 pg   MCHC 28.9 (L) 30.0 - 36.0 g/dL   RDW 62.9 (H)  11.5 - 15.5 %   Platelets 313 150 - 400 K/uL   nRBC 0.0 0.0 - 0.2 %   Neutrophils Relative % 84 %   Neutro Abs 8.4 (H) 1.7 - 7.7 K/uL   Lymphocytes Relative 8 %   Lymphs Abs 0.8 0.7 - 4.0 K/uL   Monocytes Relative 7 %   Monocytes Absolute 0.7 0.1 - 1.0 K/uL   Eosinophils Relative 0 %   Eosinophils Absolute 0.0 0.0 - 0.5 K/uL   Basophils Relative 1 %   Basophils Absolute 0.1 0.0 - 0.1 K/uL   Immature Granulocytes 0 %   Abs Immature Granulocytes 0.04 0.00 - 0.07 K/uL    Comment: Performed at [facility], [address] [place], [place] [address]  Comprehensive metabolic panel     Status: Abnormal   Collection Time: 10/12/18  1:02 AM  Result Value Ref Range   Sodium 139 135 - 145 mmol/L   Potassium 3.3 (L) 3.5 - 5.1 mmol/L   Chloride 98 98 - 111 mmol/L   CO2 31 22 - 32  mmol/L   Glucose, Bld 274 (H) 70 - 99 mg/dL   BUN 23 (H) 6 - 20 mg/dL   Creatinine, Ser 0.21 (H) 0.61 - 1.24 mg/dL   Calcium 8.8 (L) 8.9 - 10.3 mg/dL   Total Protein 6.6 6.5 - 8.1 g/dL   Albumin 2.8 (L) 3.5 - 5.0 g/dL   AST 44 (H) 15 - 41 U/L   ALT 24 0 - 44 U/L   Alkaline Phosphatase 113 38 - 126 U/L   Total Bilirubin 0.8 0.3 - 1.2 mg/dL   GFR calc non Af Amer 59 (L) >60 mL/min   GFR calc Af Amer >60 >60 mL/min   Anion gap 10 5 - 15    Comment: Performed at [facility], [address] [place], [place] [address]  Brain natriuretic peptide     Status: Abnormal   Collection Time: 10/12/18  1:02 AM  Result Value Ref Range   B Natriuretic Peptide 1,289.1 (H) 0.0 - 100.0 pg/mL    Comment: Performed at [facility], [address], [place], [place] [address]  Urinalysis, Routine w reflex microscopic     Status: Abnormal   Collection Time: 10/12/18  1:56 AM  Result Value Ref Range   Color, Urine STRAW (A) YELLOW   APPearance CLEAR CLEAR   Specific Gravity, Urine 1.011 1.005 - 1.030   pH 7.0 5.0 - 8.0   Glucose, UA 50 (A) NEGATIVE mg/dL   Hgb urine dipstick NEGATIVE NEGATIVE   Bilirubin Urine NEGATIVE NEGATIVE   Ketones, ur NEGATIVE NEGATIVE mg/dL   Protein, ur 30 (A) NEGATIVE mg/dL   Nitrite NEGATIVE NEGATIVE   Leukocytes,Ua NEGATIVE NEGATIVE   RBC / HPF 0-5 0 - 5 RBC/hpf   WBC, UA 0-5 0 - 5 WBC/hpf   Bacteria, UA NONE SEEN NONE SEEN   Squamous Epithelial / LPF 0-5 0 - 5    Comment: Performed at [facility], [address], [place], [place] [address]   Ct Abdomen Pelvis W Contrast  Result Date: 10/12/2018 CLINICAL DATA:  58 y/o M; bilateral testicular pain and swelling for 1 month. History of polysubstance abuse, hepatitis-C, diabetes. EXAM: CT ABDOMEN AND PELVIS WITH CONTRAST TECHNIQUE: Multidetector CT imaging of the abdomen and pelvis was performed using the standard protocol following bolus administration of intravenous contrast. CONTRAST:  OMNIPAQUE  IOHEXOL 300 MG/ML  SOLN COMPARISON:  07/23/2018 CT  abdomen and pelvis. FINDINGS: Lower chest: Increased moderate cardiomegaly. Stable metallic density within the anterior aspect of right middle lobe. Hepatobiliary: No focal liver abnormality is seen. No gallstones or biliary ductal dilatation. Small volume of fluid within the gallbladder fossa is likely related to perihepatic ascites. Pancreas: Unremarkable. No pancreatic ductal dilatation or surrounding inflammatory changes. Spleen: Normal in size without focal abnormality. Adrenals/Urinary Tract: Adrenal glands are unremarkable. Stable right kidney cysts measuring up to 4.0 cm arising from the lower pole. Kidneys are otherwise normal, without renal calculi, focal lesion, or hydronephrosis. Diffuse bladder wall thickening. Stomach/Bowel: Stomach is within normal limits. Appendix appears normal. No evidence of bowel wall thickening, distention, or inflammatory changes. Vascular/Lymphatic: Aortic atherosclerosis. No enlarged abdominal or pelvic lymph nodes. Reproductive: Stable severe enlargement of the prostate. Other: Small volume of peritoneal ascites. Diffuse edema throughout the subcutaneous fat predominantly of lower abdomen and visible pelvis. Musculoskeletal: No fracture is seen. Stable well-marginated sclerotic foci within the pelvis compatible with bone islands. IMPRESSION: 1. Small volume of ascites and diffuse edema throughout subcutaneous fat predominantly of lower abdomen and visible pelvis. Interval increased cardiomegaly favors heart failure as the etiology. 2. Stable prostate enlargement and bladder wall thickening likely due to chronic outflow obstruction. 3.  Aortic Atherosclerosis (ICD10-I70.0). Electronically Signed   By: Mitzi HansenLance  Furusawa-Stratton M.D.   On: 10/12/2018 01:49    Pending Labs Unresulted Labs (From admission, onward)   None      Vitals/Pain Today's Vitals   10/12/18 0205 10/12/18 0227 10/12/18 0253 10/12/18 0300  BP:  (!)  169/111 (!) 165/101 (!) 162/98  Pulse:  (!) 102 98 (!) 102  Resp:  18 18 16   Temp:      TempSrc:      SpO2:  100% 100% 99%  Weight:      Height:      PainSc: 0-No pain Asleep 0-No pain Asleep    Isolation Precautions No active isolations  Medications Medications  piperacillin-tazobactam (ZOSYN) IVPB 3.375 g (3.375 g Intravenous New Bag/Given 10/12/18 0303)  vancomycin (VANCOCIN) 1,500 mg in sodium chloride 0.9 % 500 mL IVPB (1,500 mg Intravenous New Bag/Given 10/12/18 0315)  furosemide (LASIX) 160 mg in dextrose 5 % 50 mL IVPB (0 mg Intravenous Stopped 10/12/18 0226)  iohexol (OMNIPAQUE) 300 MG/ML solution 100 mL (100 mLs Intravenous Contrast Given 10/12/18 0127)    Mobility walks Low fall risk   Focused Assessments No family present .    R Recommendations: See Admitting Provider Note  Report given to:   Additional Notes:

## 2018-10-12 NOTE — Progress Notes (Signed)
Spoke to the MD Rhona Leavens about complaint of dry hacking cough with phlegm. The patient said he has had this for a long time. MD okay to leave patient in location r/t no other s&s of covid 19. Will continue to monitor the patient closely.   Sheppard Evens RN

## 2018-10-12 NOTE — Progress Notes (Signed)
The patient has reached his fluid restriction for today. Notified MD Rhona Leavens and the patient is now drinking from the sink. Education was not accepted from patient.   Sheppard Evens RN

## 2018-10-12 NOTE — Progress Notes (Signed)
The patient has a cbg of 50 this am. He refused a recheck. He had gram crackers 4 packs and two apple juices. Breakfast just arrived and he is eating all of that. I will continue to monitor the patient closely.   Sheppard Evens RN

## 2018-10-13 LAB — BASIC METABOLIC PANEL
Anion gap: 10 (ref 5–15)
BUN: 19 mg/dL (ref 6–20)
CO2: 30 mmol/L (ref 22–32)
Calcium: 8.6 mg/dL — ABNORMAL LOW (ref 8.9–10.3)
Chloride: 102 mmol/L (ref 98–111)
Creatinine, Ser: 1.13 mg/dL (ref 0.61–1.24)
GFR calc Af Amer: >60 mL/min (ref >60)
GFR calc non Af Amer: >60 mL/min (ref >60)
Glucose, Bld: 158 mg/dL — ABNORMAL HIGH (ref 70–99)
Potassium: 3.1 mmol/L — ABNORMAL LOW (ref 3.5–5.1)
Sodium: 142 mmol/L (ref 135–145)

## 2018-10-13 LAB — GLUCOSE, CAPILLARY
Glucose-Capillary: 104 mg/dL — ABNORMAL HIGH (ref 70–99)
Glucose-Capillary: 152 mg/dL — ABNORMAL HIGH (ref 70–99)
Glucose-Capillary: 175 mg/dL — ABNORMAL HIGH (ref 70–99)

## 2018-10-13 MED ORDER — POTASSIUM CHLORIDE CRYS ER 20 MEQ PO TBCR
40.0000 meq | EXTENDED_RELEASE_TABLET | Freq: Two times a day (BID) | ORAL | Status: DC
  Administered 2018-10-13: 40 meq via ORAL
  Filled 2018-10-13: qty 2

## 2018-10-13 MED ORDER — LIDOCAINE 4 % EX CREA
TOPICAL_CREAM | Freq: Every day | CUTANEOUS | Status: DC | PRN
  Filled 2018-10-13: qty 5

## 2018-10-13 MED ORDER — POTASSIUM CHLORIDE CRYS ER 20 MEQ PO TBCR: 20.0000 meq | EXTENDED_RELEASE_TABLET | Freq: Two times a day (BID) | ORAL | Status: DC

## 2018-10-13 NOTE — Discharge Summary (Signed)
Physician Discharge Summary  [name] WUJ:[id number] DOB: May 16, 1961 DOA: 10/12/2018  PCP: Patient, No Pcp Per  Admit date: 10/12/2018 Discharge date: 10/13/2018  PATIENT LEFT AGAINST MEDICAL ADIVCE BEFORE BEING RE-EVALUATED  Brief/Interim Summary: 58 y.o.malewith medical history significant forschizophrenia, insulin-dependent diabetes mellitus, polysubstance abuse, hypertension,OSA,homelessness, and noncompliance with his treatment plan, now presenting to the emergency department for evaluation of testicular swelling and penile pain after leaving the hospital AMA on 10/10/2018. Patient reports chronic swelling of his scrotum that has worsened recently, and also notes the insidious development of pain involving his penis. He denies any fevers or chills. Denies any drainage from his urethra, but reports some fluid coming from the ventral aspect of the shaft.Patient was admitted to the hospital on 10/03/2018 with acute CHF, was noncompliant with discharge medications and was admitted again for the same on 10/09/2018, left AMA that same day, returnedto the hospital a couple hours later, was admitted again for acute CHF, and again left AMA the following day. Now returns for evaluation of ongoing edema involving bilateral legs, lower abdomen, and scrotum, as well as new penile pain with maceration and drainage.  ED Course:Upon arrival to the ED, patient is found to be afebrile, saturating adequately on room air, slightly tachycardic, and hypertensive 276/111. EKG features a sinus tachycardia with lateral T wave inversions that are similar to prior. Chemistry panel is notable for potassium 3.3, glucose 274, and creatinine of 1.32.CBC is notable for stable normocytic anemia. BNP is elevated to 1289. CT the abdomen and pelvis reveals small volume ascites and diffuse edema throughout the subcutaneous fat, predominantly of the lower abdomen and pelvis with interval increase in cardiomegaly,  favoring heart failure as the etiology. Patient was started on vancomycin and Zosyn in the ED and given 160 mg of IV Lasix. Blood pressureisimproving with diuresis, and the patient will be admitted for ongoing evaluation and management.  Discharge Diagnoses:  Principal Problem:   Acute on chronic combined systolic and diastolic CHF (congestive heart failure) (HCC) Active Problems:   Insulin-requiring or dependent type II diabetes mellitus (HCC)   Schizophrenia, paranoid type (HCC)   Hypokalemia   Scrotal swelling   Hypertensive urgency   Mild renal insufficiency   Cellulitis   1.Acute on chronic combined systolic & diastolic CHF - Markedly volume overloaded on presentation likely secondary to gross medical noncompliance -Presents with increase in his chronic scrotal swelling, as well as penile pain -Echo from last month with EF 25-30%, moderate LVH, global HK, and grade 3 diastolic dysfunction - Was continued on IV lasix BID with good diuresis - On the morning of 4/14, patient elected to leave AMA. Patient knew the risk of leaving  2.Cellulitis -Patient complains of scrotal and penile pain -Scrotum swelling is chronic and related to his poorly-controlled heart failure -There is thick drainage and foul odor concerning for infection; no abscess or evidence for Fournier gangrene on CT  - Given the severity and location, and in light of his poorly controlled DM, he was started on vancomycin and Zosyn in ED  3.Insulin-dependent DM -A1c was 7.3% in January -SSI with Novolog while in hospital - Patient non-compliant with diet  4.Mild renal insufficiency -SCr is 1.32 on admission, up from apparent baseline of ~1  5.Schizophrenia -Denies SI, HI, or hallucinations in ED -Continued Abilify and Tegretol  6.Non-compliance - Patient acknowledges non-compliance with medications and habit of leaving hospital AMA, reports he is planning to stay this  admission -Mental illness and substance abuse likely contributing  7.Tobacco, alcohol, and cocaine abuse -Counseled, though patient not very receptive -Nicotine patch offered -Monitor with CIWA, supplement vitamins  8.Hypokalemia -Potassium 3.3, likely secondary to recent diuresis -Replaced, follow chemistries while diuresing  9. Hypertension with hypertensive urgency  -BP 180/110 range in ED without chest pain or headache -Improving with diuresis -Hold lisinopril in light of increased creatinine, continue diuresis, and use hydralazine IVP's as needed - BP remained stable   Discharge Instructions   Allergies as of 10/13/2018      Reactions   Haldol [haloperidol] Other (See Comments)   Muscle spasms, loss of voluntary movement. However, pt has taken Thorazine on multiple occasions with no adverse effects.       Medication List    ASK your doctor about these medications   ARIPiprazole 10 MG tablet Commonly known as:  ABILIFY Take 1 tablet (10 mg total) by mouth daily.   aspirin EC 81 MG tablet Take 1 tablet (81 mg total) by mouth daily for 30 days.   atorvastatin 40 MG tablet Commonly known as:  LIPITOR Take 1 tablet (40 mg total) by mouth daily.   bisacodyl 5 MG EC tablet Commonly known as:  DULCOLAX Take 1 tablet (5 mg total) by mouth daily as needed for moderate constipation.   carbamazepine 200 MG tablet Commonly known as:  TEGRETOL Take 0.5 tablets (100 mg total) by mouth 2 (two) times daily.   furosemide 40 MG tablet Commonly known as:  LASIX Take 1 tablet (40 mg total) by mouth daily.   hydrocortisone 1 % Crea Commonly known as:  Procto-Pak Apply twice a day to hemorrhoids.   Insulin Glargine 100 UNIT/ML Solostar Pen Commonly known as:  LANTUS Inject 14 Units into the skin daily.   Insulin Pen Needle 29G X Misc Use with insulin   lisinopril 10 MG tablet Commonly known as:  PRINIVIL,ZESTRIL Take 1 tablet (10 mg  total) by mouth daily.   nicotine 21 mg/24hr patch Commonly known as:  NICODERM CQ - dosed in mg/24 hours Place 1 patch (21 mg total) onto the skin daily.   polyethylene glycol 17 g packet Commonly known as:  MIRALAX / GLYCOLAX Take 17 g by mouth 2 (two) times daily.   Potassium Chloride ER 20 MEQ Tbcr Take 20 mEq by mouth daily.   senna-docusate 8.6-50 MG tablet Commonly known as:  Senokot-S Take 1 tablet by mouth 2 (two) times daily.   tamsulosin 0.4 MG Caps capsule Commonly known as:  FLOMAX Take 1 capsule (0.4 mg total) by mouth daily after breakfast.       Allergies  Allergen Reactions  . Haldol [Haloperidol] Other (See Comments)    Muscle spasms, loss of voluntary movement. However, pt has taken Thorazine on multiple occasions with no adverse effects.      Procedures/Studies: Dg Chest 2 View  Result Date: 10/02/2018 CLINICAL DATA:  Cough, abdominal pain EXAM: CHEST - 2 VIEW COMPARISON:  09/21/2018 FINDINGS: Lingular scarring. No focal consolidation. No pleural effusion or pneumothorax. Cardiomegaly. Degenerative changes of the visualized thoracolumbar spine. IMPRESSION: Normal chest radiographs. Electronically Signed   By: Charline Bills M.D.   On: 10/02/2018 03:42   Dg Chest 2 View  Result Date: 09/21/2018 CLINICAL DATA:  Chest pain, groin swelling, suicidal ideation EXAM: CHEST - 2 VIEW COMPARISON:  09/20/2018 FINDINGS: Linear scarring in the lingula. Lungs otherwise clear. No pleural effusion or pneumothorax. Cardiomegaly. Mild degenerative changes of the visualized thoracolumbar spine. IMPRESSION: No evidence of acute cardiopulmonary disease. Electronically Signed  By: Charline Bills M.D.   On: 09/21/2018 19:04   Dg Chest 2 View  Result Date: 09/15/2018 CLINICAL DATA:  Chest pain EXAM: CHEST - 2 VIEW COMPARISON:  09/13/2018 FINDINGS: Cardiomegaly with vascular congestion. Possible early interstitial edema. No confluent opacities or effusions. No acute bony  abnormality. IMPRESSION: Cardiomegaly with vascular congestion and possible early interstitial edema. Electronically Signed   By: Charlett Nose M.D.   On: 09/15/2018 03:04   US Scrotum  Result Date: 09/17/2018 CLINICAL DATA:  Scrotal swelling. EXAM: ULTRASOUND OF SCROTUM TECHNIQUE: Complete ultrasound examination of the testicles, epididymis, and other scrotal structures was performed. COMPARISON:  Scrotal ultrasound dated September 13, 2018. FINDINGS: Right testicle Measurements: 3.4 x 2.9 x 2.7 cm. No mass or microlithiasis visualized. Left testicle Measurements: 3.4 x 1.9 x 2.2 cm. Unchanged mild heterogeneity. No mass or microlithiasis visualized. Right epididymis:  Unchanged 2.8 cm cyst in the epididymal head. Left epididymis:  Normal in size and appearance. Hydrocele:  Unchanged small right hydrocele. Varicocele:  None visualized. Unchanged diffuse scrotal edema. IMPRESSION: 1. Relatively unchanged diffuse scrotal edema. No focal fluid collection. 2. Unchanged small right hydrocele and right epididymal head cyst. Electronically Signed   By: Obie Dredge M.D.   On: 09/17/2018 12:22   Ct Abdomen Pelvis W Contrast  Result Date: 10/12/2018 CLINICAL DATA:  58 y/o M; bilateral testicular pain and swelling for 1 month. History of polysubstance abuse, hepatitis-C, diabetes. EXAM: CT ABDOMEN AND PELVIS WITH CONTRAST TECHNIQUE: Multidetector CT imaging of the abdomen and pelvis was performed using the standard protocol following bolus administration of intravenous contrast. CONTRAST:  OMNIPAQUE IOHEXOL 300 MG/ML  SOLN COMPARISON:  07/23/2018 CT abdomen and pelvis. FINDINGS: Lower chest: Increased moderate cardiomegaly. Stable metallic density within the anterior aspect of right middle lobe. Hepatobiliary: No focal liver abnormality is seen. No gallstones or biliary ductal dilatation. Small volume of fluid within the gallbladder fossa is likely related to perihepatic ascites. Pancreas: Unremarkable. No  pancreatic ductal dilatation or surrounding inflammatory changes. Spleen: Normal in size without focal abnormality. Adrenals/Urinary Tract: Adrenal glands are unremarkable. Stable right kidney cysts measuring up to 4.0 cm arising from the lower pole. Kidneys are otherwise normal, without renal calculi, focal lesion, or hydronephrosis. Diffuse bladder wall thickening. Stomach/Bowel: Stomach is within normal limits. Appendix appears normal. No evidence of bowel wall thickening, distention, or inflammatory changes. Vascular/Lymphatic: Aortic atherosclerosis. No enlarged abdominal or pelvic lymph nodes. Reproductive: Stable severe enlargement of the prostate. Other: Small volume of peritoneal ascites. Diffuse edema throughout the subcutaneous fat predominantly of lower abdomen and visible pelvis. Musculoskeletal: No fracture is seen. Stable well-marginated sclerotic foci within the pelvis compatible with bone islands. IMPRESSION: 1. Small volume of ascites and diffuse edema throughout subcutaneous fat predominantly of lower abdomen and visible pelvis. Interval increased cardiomegaly favors heart failure as the etiology. 2. Stable prostate enlargement and bladder wall thickening likely due to chronic outflow obstruction. 3.  Aortic Atherosclerosis (ICD10-I70.0). Electronically Signed   By: Mitzi Hansen M.D.   On: 10/12/2018 01:49   Dg Chest Port 1 View  Result Date: 10/09/2018 CLINICAL DATA:  Initial evaluation for acute scrotal pain and dental pain, edema. EXAM: PORTABLE CHEST 1 VIEW COMPARISON:  Prior radiograph from 10/03/2018 FINDINGS: Moderate cardiomegaly, stable. Mediastinal silhouette within normal limits. Aortic atherosclerosis. Lungs normally inflated. Linear atelectasis/scarring present within the left perihilar region, stable. Mild diffuse pulmonary interstitial congestion without overt pulmonary edema. No pleural effusion. No consolidative opacity. No pneumothorax. No acute osseous finding.  Metallic  density overlies the right upper quadrant, stable. IMPRESSION: 1. Stable cardiomegaly with mild diffuse pulmonary interstitial congestion without overt pulmonary edema. 2. Aortic atherosclerosis. Electronically Signed   By: Rise MuBenjamin  McClintock M.D.   On: 10/09/2018 03:43   Dg Chest Port 1 View  Result Date: 10/03/2018 CLINICAL DATA:  Swelling of the lower half of the body. Shortness of breath and cough. EXAM: PORTABLE CHEST 1 VIEW COMPARISON:  10/02/2018.  Multiple prior chest radiographs. FINDINGS: Chronic cardiomegaly with left ventricular prominence. Chronic aortic atherosclerosis. Chronic pulmonary scarring in the left mid lung. No evidence of heart failure or effusion. No active pulmonary process. IMPRESSION: No active disease. Chronic cardiomegaly, aortic atherosclerosis and left mid lung scarring. Electronically Signed   By: Paulina FusiMark  Shogry M.D.   On: 10/03/2018 19:37   Dg Chest Port 1 View  Result Date: 09/20/2018 CLINICAL DATA:  Initial evaluation for acute shortness of breath. EXAM: PORTABLE CHEST 1 VIEW COMPARISON:  Prior radiograph from 09/17/2018 FINDINGS: Moderate cardiomegaly, stable. Mediastinal silhouette within normal limits. Lungs normally inflated. Mild diffuse pulmonary interstitial congestion/edema. Superimposed left perihilar scarring, similar to previous. No focal infiltrates. No pneumothorax. No visible pleural effusion. No acute osseous finding. Degenerative changes noted about the partially visualized right shoulder. IMPRESSION: 1. Cardiomegaly with mild diffuse pulmonary interstitial congestion/edema. 2. Superimposed left perihilar scarring. Electronically Signed   By: Rise MuBenjamin  McClintock M.D.   On: 09/20/2018 06:36   Dg Chest Portable 1 View  Result Date: 09/17/2018 CLINICAL DATA:  Chest pain.  Shortness of breath. EXAM: PORTABLE CHEST 1 VIEW COMPARISON:  09/15/2018. FINDINGS: Cardiomegaly. Diffuse mild interstitial prominence again noted. Slight progressed from prior  exam. Findings suggest CHF. Pneumonitis can not be mild left mid lung field subsegmental atelectasis/scarring. Tiny right pleural effusion. No pneumothorax. No acute bony abnormality. Degenerative change thoracic spine. IMPRESSION: Cardiomegaly with diffuse mild interstitial prominence again noted consistent with CHF. Pneumonitis can not be excluded. There is slight progression from prior exam. Tiny right pleural effusion. Electronically Signed   By: Maisie Fushomas  Register   On: 09/17/2018 05:45      Discharge Exam: Vitals:   10/13/18 0429 10/13/18 0749  BP: (!) 155/86 (!) 150/92  Pulse: 93 95  Resp: 18 18  Temp: 98.4 F (36.9 C) 98.2 F (36.8 C)  SpO2: 91% 92%   Vitals:   10/12/18 1300 10/12/18 2048 10/13/18 0429 10/13/18 0749  BP: (!) 141/75 132/86 (!) 155/86 (!) 150/92  Pulse: (!) 102 97 93 95  Resp:  20 18 18   Temp: 98 F (36.7 C) 98.4 F (36.9 C) 98.4 F (36.9 C) 98.2 F (36.8 C)  TempSrc: Oral Oral  Axillary  SpO2: 100% 96% 91% 92%  Weight:      Height:         The results of significant diagnostics from this hospitalization (including imaging, microbiology, ancillary and laboratory) are listed below for reference.     Microbiology: No results found for this or any previous visit (from the past 240 hour(s)).   Labs: BNP (last 3 results) Recent Labs    10/03/18 1651 10/09/18 0056 10/12/18 0102  BNP 1,117.6* 1,494.9* 1,289.1*   Basic Metabolic Panel: Recent Labs  Lab 10/09/18 0056 10/09/18 1746 10/10/18 0310 10/12/18 0102 10/13/18 0508  NA 135  --  137 139 142  K 3.8  --  3.9 3.3* 3.1*  CL 98  --  99 98 102  CO2 29  --  27 31 30   GLUCOSE 383*  --  275* 274* 158*  BUN 22*  --  26* 23* 19  CREATININE 1.11 1.09 1.17 1.32* 1.13  CALCIUM 9.1  --  9.1 8.8* 8.6*   Liver Function Tests: Recent Labs  Lab 10/09/18 0056 10/10/18 0310 10/12/18 0102  AST 37 33 44*  ALT ALKPHOS 125 108 113  BILITOT 0.4 0.8 0.8  PROT 7.4 6.7 6.6  ALBUMIN 2.9* 2.7*  2.8*   No results for input(s): LIPASE, AMYLASE in the last 168 hours. No results for input(s): AMMONIA in the last 168 hours. CBC: Recent Labs  Lab 10/09/18 0056 10/09/18 1746 10/10/18 0310 10/12/18 0102  WBC 9.7 10.4 8.3 10.0  NEUTROABS  --   --   --  8.4*  HGB 10.4* 9.7* 9.9* 9.9*  HCT 35.1* 32.4* 32.4* 34.2*  MCV 83.0 81.2 81.0 81.4  PLT 335 328 287 313   Cardiac Enzymes: No results for input(s): CKTOTAL, CKMB, CKMBINDEX, TROPONINI in the last 168 hours. BNP: Invalid input(s): POCBNP CBG: Recent Labs  Lab 10/12/18 1655 10/12/18 2049 10/13/18 0053 10/13/18 0424 10/13/18 0746  GLUCAP 191* 357* 104* 152* 175*   D-Dimer No results for input(s): DDIMER in the last 72 hours. Hgb A1c No results for input(s): HGBA1C in the last 72 hours. Lipid Profile No results for input(s): CHOL, HDL, LDLCALC, TRIG, CHOLHDL, LDLDIRECT in the last 72 hours. Thyroid function studies No results for input(s): TSH, T4TOTAL, T3FREE, THYROIDAB in the last 72 hours.  Invalid input(s): FREET3 Anemia work up No results for input(s): VITAMINB12, FOLATE, FERRITIN, TIBC, IRON, RETICCTPCT in the last 72 hours. Urinalysis    Component Value Date/Time   COLORURINE STRAW (A) 10/12/2018 0156   APPEARANCEUR CLEAR 10/12/2018 0156   LABSPEC 1.011 10/12/2018 0156   PHURINE 7.0 10/12/2018 0156   GLUCOSEU 50 (A) 10/12/2018 0156   HGBUR NEGATIVE 10/12/2018 0156   BILIRUBINUR NEGATIVE 10/12/2018 0156   KETONESUR NEGATIVE 10/12/2018 0156   PROTEINUR 30 (A) 10/12/2018 0156   UROBILINOGEN 1.0 03/14/2015 0610   NITRITE NEGATIVE 10/12/2018 0156   LEUKOCYTESUR NEGATIVE 10/12/2018 0156   Sepsis Labs Invalid input(s): PROCALCITONIN,  WBC,  LACTICIDVEN Microbiology No results found for this or any previous visit (from the past 240 hour(s)).   SIGNED:   Rickey Barbara, MD  [facility] 10/13/2018, 1:14 PM  If 7PM-7AM, please contact night-coverage

## 2018-10-13 NOTE — Progress Notes (Signed)
Patient notified RN that he is leaving AMA, discussed importance of completing treatment and what leaving AMA means. Patient verbalized understanding and states he is leaving. Dr. Rhona Leavens notified and aware patient is leaving.  IV d/ced. Patient to leave when ride arrives.

## 2018-10-14 ENCOUNTER — Other Ambulatory Visit: Payer: Self-pay

## 2018-10-14 ENCOUNTER — Emergency Department (HOSPITAL_COMMUNITY)
Admission: EM | Admit: 2018-10-14 | Discharge: 2018-10-14 | Disposition: A | Discharge status: Home / Self Care | Payer: Medicaid Other | Attending: Emergency Medicine | Admitting: Emergency Medicine

## 2018-10-14 DIAGNOSIS — N4822 Cellulitis of corpus cavernosum and penis: Secondary | ICD-10-CM | POA: Insufficient documentation

## 2018-10-14 DIAGNOSIS — I110 Hypertensive heart disease with heart failure: Secondary | ICD-10-CM | POA: Insufficient documentation

## 2018-10-14 DIAGNOSIS — Z794 Long term (current) use of insulin: Secondary | ICD-10-CM | POA: Diagnosis not present

## 2018-10-14 DIAGNOSIS — F209 Schizophrenia, unspecified: Secondary | ICD-10-CM | POA: Insufficient documentation

## 2018-10-14 DIAGNOSIS — Z79899 Other long term (current) drug therapy: Secondary | ICD-10-CM | POA: Diagnosis not present

## 2018-10-14 DIAGNOSIS — Z7982 Long term (current) use of aspirin: Secondary | ICD-10-CM | POA: Diagnosis not present

## 2018-10-14 DIAGNOSIS — F1490 Cocaine use, unspecified, uncomplicated: Secondary | ICD-10-CM | POA: Insufficient documentation

## 2018-10-14 DIAGNOSIS — Z590 Homelessness: Secondary | ICD-10-CM | POA: Insufficient documentation

## 2018-10-14 DIAGNOSIS — E119 Type 2 diabetes mellitus without complications: Secondary | ICD-10-CM | POA: Diagnosis not present

## 2018-10-14 DIAGNOSIS — R609 Edema, unspecified: Secondary | ICD-10-CM | POA: Insufficient documentation

## 2018-10-14 DIAGNOSIS — F17210 Nicotine dependence, cigarettes, uncomplicated: Secondary | ICD-10-CM | POA: Diagnosis not present

## 2018-10-14 DIAGNOSIS — I5042 Chronic combined systolic (congestive) and diastolic (congestive) heart failure: Secondary | ICD-10-CM | POA: Insufficient documentation

## 2018-10-14 DIAGNOSIS — I509 Heart failure, unspecified: Secondary | ICD-10-CM

## 2018-10-14 DIAGNOSIS — N489 Disorder of penis, unspecified: Secondary | ICD-10-CM | POA: Diagnosis present

## 2018-10-14 LAB — BASIC METABOLIC PANEL
Anion gap: 10 (ref 5–15)
BUN: 21 mg/dL — ABNORMAL HIGH (ref 6–20)
CO2: 29 mmol/L (ref 22–32)
Calcium: 9.2 mg/dL (ref 8.9–10.3)
Chloride: 99 mmol/L (ref 98–111)
Creatinine, Ser: 1.26 mg/dL — ABNORMAL HIGH (ref 0.61–1.24)
GFR calc Af Amer: >60 mL/min (ref >60)
GFR calc non Af Amer: >60 mL/min (ref >60)
Glucose, Bld: 330 mg/dL — ABNORMAL HIGH (ref 70–99)
Potassium: 3.4 mmol/L — ABNORMAL LOW (ref 3.5–5.1)
Sodium: 138 mmol/L (ref 135–145)

## 2018-10-14 LAB — CBC
HCT: 35.2 % — ABNORMAL LOW (ref 39.0–52.0)
Hemoglobin: 10.2 g/dL — ABNORMAL LOW (ref 13.0–17.0)
MCH: 24 pg — ABNORMAL LOW (ref 26.0–34.0)
MCHC: 29 g/dL — ABNORMAL LOW (ref 30.0–36.0)
MCV: 82.8 fL (ref 80.0–100.0)
Platelets: 275 10*3/uL (ref 150–400)
RBC: 4.25 MIL/uL (ref 4.22–5.81)
RDW: 17.9 % — ABNORMAL HIGH (ref 11.5–15.5)
WBC: 8.4 10*3/uL (ref 4.0–10.5)
nRBC: 0 % (ref 0.0–0.2)

## 2018-10-14 LAB — BRAIN NATRIURETIC PEPTIDE: B Natriuretic Peptide: 733 pg/mL — ABNORMAL HIGH (ref 0.0–100.0)

## 2018-10-14 MED ORDER — FUROSEMIDE 10 MG/ML IJ SOLN
40.0000 mg | INTRAMUSCULAR | Status: AC
  Administered 2018-10-14: 40 mg via INTRAVENOUS
  Filled 2018-10-14: qty 4

## 2018-10-14 MED ORDER — SULFAMETHOXAZOLE-TRIMETHOPRIM 800-160 MG PO TABS: 1.0000 | ORAL_TABLET | Freq: Two times a day (BID) | ORAL | 0 refills | Status: DC

## 2018-10-14 MED ORDER — CEPHALEXIN 500 MG PO CAPS: 500.0000 mg | ORAL_CAPSULE | Freq: Four times a day (QID) | ORAL | 0 refills | Status: DC

## 2018-10-14 MED ORDER — SULFAMETHOXAZOLE-TRIMETHOPRIM 800-160 MG PO TABS
1.0000 | ORAL_TABLET | Freq: Once | ORAL | Status: AC
  Administered 2018-10-14: 1 via ORAL
  Filled 2018-10-14: qty 1

## 2018-10-14 MED ORDER — ACETAMINOPHEN 500 MG PO TABS
1000.0000 mg | ORAL_TABLET | Freq: Once | ORAL | Status: AC
  Administered 2018-10-14: 1000 mg via ORAL
  Filled 2018-10-14: qty 2

## 2018-10-14 MED ORDER — CEPHALEXIN 250 MG PO CAPS
500.0000 mg | ORAL_CAPSULE | Freq: Once | ORAL | Status: AC
  Administered 2018-10-14: 500 mg via ORAL
  Filled 2018-10-14: qty 2

## 2018-10-14 MED ORDER — INSULIN GLARGINE 100 UNIT/ML ~~LOC~~ SOLN
14.0000 [IU] | Freq: Every day | SUBCUTANEOUS | Status: DC
  Administered 2018-10-14: 07:00:00 14 [IU] via SUBCUTANEOUS
  Filled 2018-10-14 (×2): qty 0.14

## 2018-10-14 NOTE — ED Provider Notes (Signed)
[name] is a 58 y.o. male, presenting to the ED with persistent edema to the lower extremities and genital region.  HPI from [name], PA-C: "58 year old male with a history of schizophrenia, insulin-dependent diabetes mellitus, polysubstance abuse, hypertension, OSA, homelessness presents to the ED for evaluation of persistent edema to his BLE and genital region. Was admitted for penile cellulitis on 10/12/2018; left AMA yesterday AM. The patient has not been taking any antibiotics since discharge. Was diuresed with lasix while inpatient for CHF, given Vanc/Zosyn for cellulitis. Left AMA because patient became frustrated that he was not being given ice cream and double servings of food. Denies SOB and chest pain today. No difficulty voiding. Has some discomfort in his BLE associated with his persistent edema. Hx of frequent ED visits due to homelessness and poorly managed psychiatric disorder."  Past Medical History:  Diagnosis Date  . Chronic foot pain   . Cocaine abuse (HCC)   . Diabetes mellitus without complication (HCC)   . Hepatitis C    unsure   . Homelessness   . Hypertension   . Neuropathy   . Polysubstance abuse (HCC)   . Schizophrenia (HCC)   . Sleep apnea   . Systolic and diastolic CHF, chronic (HCC)     Physical Exam  BP (!) 168/103   Pulse 97   Temp (!) 97.1 F (36.2 C)   Resp 17   Ht 5\' 8"  (1.727 m)   Wt 88.5 kg   SpO2 93%   BMI 29.67 kg/m   Physical Exam Vitals signs and nursing note reviewed.  Constitutional:      General: He is not in acute distress.    Appearance: He is well-developed. He is not diaphoretic.  HENT:     Head: Normocephalic and atraumatic.     Mouth/Throat:     Mouth: Mucous membranes are moist.     Pharynx: Oropharynx is clear.  Eyes:     Conjunctiva/sclera: Conjunctivae normal.  Neck:     Musculoskeletal: Neck supple.  Cardiovascular:     Rate and Rhythm: Normal rate and regular rhythm.     Pulses: Normal pulses.    Comments: Tactile temperature in the extremities appropriate and equal bilaterally. Patient refused to allow me to listen to heart and lungs. Pulmonary:     Effort: Pulmonary effort is normal. No respiratory distress.     Comments: No increased work of breathing.  Speaks in full sentences without difficulty.  Able to lay supine without difficulty. Abdominal:     Palpations: Abdomen is soft.     Tenderness: There is no abdominal tenderness. There is no guarding.  Musculoskeletal:     Right lower leg: Edema present.     Left lower leg: Edema present.  Lymphadenopathy:     Cervical: No cervical adenopathy.  Skin:    General: Skin is warm and dry.  Neurological:     Mental Status: He is alert.  Psychiatric:        Mood and Affect: Mood and affect normal.        Speech: Speech normal.        Behavior: Behavior normal.     ED Course/Procedures    Procedures  Abnormal Labs Reviewed  BRAIN NATRIURETIC PEPTIDE - Abnormal; Notable for the following components:      Result Value   B Natriuretic Peptide 733.0 (*)    All other components within normal limits  CBC - Abnormal; Notable for the following components:   Hemoglobin  10.2 (*)    HCT 35.2 (*)    MCH 24.0 (*)    MCHC 29.0 (*)    RDW 17.9 (*)    All other components within normal limits  BASIC METABOLIC PANEL - Abnormal; Notable for the following components:   Potassium 3.4 (*)    Glucose, Bld 330 (*)    BUN 21 (*)    Creatinine, Ser 1.26 (*)    All other components within normal limits      MDM    Clinical Course as of Oct 14 906  Wed Oct 14, 2018  0810 Baseline for patient when historical data reviewed.  SpO2: 93 % [SJ]  0811 Improved from over 1200 two days ago.  B Natriuretic Peptide(!): 733.0 [SJ]    Clinical Course User Index [SJ] Anselm PancoastJoy,  C, PA-C   [facility]. Plan: BNP pending. If stable or improved, discharge patient. Prescriptions for penile  cellulitis have been sent to pharmacy. Patient is aware of the plan.   BNP has improved. Patient is nontoxic appearing, afebrile, not tachycardic, not tachypneic, not hypotensive, maintains stable and baseline SPO2 on room air, and is in no apparent distress.     Vitals:   10/14/18 0532 10/14/18 0534 10/14/18 0730 10/14/18 0859  BP:  (!) 160/100 (!) 168/103 (!) 148/82  Pulse:  97  94  Resp:  17  18  Temp:  (!) 97.1 F (36.2 C)  98 F (36.7 C)  TempSrc:    Oral  SpO2:  93%  95%  Weight: 88.5 kg     Height: 5\' 8"  (1.727 m)            Anselm PancoastJoy,  C, PA-C 10/14/18 0911    Vanetta MuldersZackowski, Scott, MD 10/17/18 1345

## 2018-10-14 NOTE — ED Notes (Signed)
PA in to re-eval and plan DC.

## 2018-10-14 NOTE — ED Triage Notes (Signed)
Patient c/o swelling; this is a chronic issue. Also c/o cold symptoms.

## 2018-10-14 NOTE — ED Provider Notes (Cosign Needed)
[facility] EMERGENCY DEPARTMENT Provider Note   CSN: [id number] Arrival date & time: 10/14/18  7829    History   Chief Complaint Chief Complaint  Patient presents with  . Groin Swelling    HPI [name] is a 58 y.o. male.     58 year old male with a history of schizophrenia, insulin-dependent diabetes mellitus, polysubstance abuse, hypertension, OSA, homelessness presents to the ED for evaluation of persistent edema to his BLE and genital region. Was admitted for penile cellulitis on 10/12/2018; left AMA yesterday AM. The patient has not been taking any antibiotics since discharge. Was diuresed with lasix while inpatient for CHF, given Vanc/Zosyn for cellulitis. Left AMA because patient became frustrated that he was not being given ice cream and double servings of food. Denies SOB and chest pain today. No difficulty voiding. Has some discomfort in his BLE associated with his persistent edema. Hx of frequent ED visits due to homelessness and poorly managed psychiatric disorder.  The history is provided by the patient. No language interpreter was used.    Past Medical History:  Diagnosis Date  . Chronic foot pain   . Cocaine abuse (HCC)   . Diabetes mellitus without complication (HCC)   . Hepatitis C    unsure   . Homelessness   . Hypertension   . Neuropathy   . Polysubstance abuse (HCC)   . Schizophrenia (HCC)   . Sleep apnea   . Systolic and diastolic CHF, chronic [facility])     Patient Active Problem List   Diagnosis Date Noted  . Hypertensive urgency 10/12/2018  . Mild renal insufficiency 10/12/2018  . Cellulitis 10/12/2018  . Penile cellulitis   . Adjustment disorder with mixed disturbance of emotions and conduct   . Sleep apnea 10/03/2018  . Hypokalemia 09/17/2018  . Scrotal swelling 09/17/2018  . Urinary hesitancy   . Constipation   . Acute on chronic combined systolic and diastolic CHF (congestive heart failure) (HCC) 08/25/2018  .  Acute respiratory failure with hypoxia (HCC) 08/25/2018  . Acute on chronic combined systolic and diastolic congestive heart failure (HCC) 08/18/2018  . Tobacco use 08/18/2018  . 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 Homelessness 08/08/2018  . Smoker 08/08/2018  . Acute exacerbation of CHF (congestive heart failure) (HCC) 03/28/2018  . Prostate enlargement 03/16/2018  . Aortic atherosclerosis (HCC) 03/16/2018  . 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 Chronic foot pain   . Schizoaffective disorder, bipolar type (HCC) 09/30/2016  . Substance induced mood disorder (HCC) 03/13/2015  . Acute kidney failure (HCC) 01/26/2015  . Schizophrenia, paranoid type (HCC) 01/17/2015  . Drug hallucinosis (HCC) 10/08/2014  . Chronic paranoid schizophrenia (HCC) 09/07/2014  . 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 Urinary retention   . Cocaine use disorder, severe, dependence (HCC)   . Essential hypertension, benign 03/28/2013  . 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 MULTIPLE TOOTH EXTRACTIONS         Home Medications    Prior to Admission medications   Medication Sig Start Date End Date Taking? Authorizing Provider  ARIPiprazole (ABILIFY) 10 MG tablet Take 1 tablet (10 mg total) by mouth daily. 09/18/18   Meredeth Ide, MD  aspirin EC 81 MG tablet Take 1 tablet (81 mg total) by mouth daily for 30 days. 09/18/18 10/18/18  Meredeth IdeLama, Gagan S, MD  atorvastatin (LIPITOR) 40 MG tablet Take 1 tablet (40 mg total) by mouth daily. 09/18/18   Meredeth IdeLama, Gagan S, MD  bisacodyl (DULCOLAX) 5 MG EC tablet Take 1 tablet (5 mg total) by mouth daily as needed for moderate constipation. 09/04/18   Rodolph Bonghompson, Daniel V, MD  carbamazepine (TEGRETOL) 200 MG tablet Take 0.5 tablets (100 mg  total) by mouth 2 (two) times daily. 09/18/18   Meredeth IdeLama, Gagan S, MD  cephALEXin (KEFLEX) 500 MG capsule Take 1 capsule (500 mg total) by mouth 4 (four) times daily for 7 days. 10/14/18 10/21/18  Antony MaduraHumes, Rafael Salway, PA-C  furosemide (LASIX) 40 MG tablet Take 1 tablet (40 mg total) by mouth daily. 09/24/18   Derwood KaplanNanavati, Ankit, MD  hydrocortisone (PROCTO-PAK) 1 % CREA Apply twice a day to hemorrhoids. 09/29/18   Renne CriglerGeiple, Joshua, PA-C  Insulin Glargine (LANTUS) 100 UNIT/ML Solostar Pen Inject 14 Units into the skin daily. 09/18/18   Meredeth IdeLama, Gagan S, MD  Insulin Pen Needle 29G X 8MM MISC Use with insulin 09/18/18   Meredeth IdeLama, Gagan S, MD  lisinopril (PRINIVIL,ZESTRIL) 10 MG tablet Take 1 tablet (10 mg total) by mouth daily. 09/18/18   Meredeth IdeLama, Gagan S, MD  nicotine (NICODERM CQ - DOSED IN MG/24 HOURS) 21 mg/24hr patch Place 1 patch (21 mg total) onto the skin daily. 09/04/18   Rodolph Bonghompson, Daniel V, MD  polyethylene glycol [facility](MIRALAX / Ethelene HalGLYCOLAX) packet Take 17 g by mouth 2 (two) times daily. 09/18/18   Meredeth IdeLama, Gagan S, MD  Potassium Chloride ER 20 MEQ TBCR Take 20 mEq by mouth daily. 09/18/18   Meredeth IdeLama, Gagan S, MD  senna-docusate (SENOKOT-S) 8.6-50 MG tablet Take 1 tablet by mouth 2 (two) times daily. 09/04/18   Rodolph Bonghompson, Daniel V, MD  sulfamethoxazole-trimethoprim (BACTRIM DS,SEPTRA DS) 800-160 MG tablet Take 1 tablet by mouth 2 (two) times daily for 7 days. 10/14/18 10/21/18  Antony MaduraHumes, Meeka Cartelli, PA-C  tamsulosin (FLOMAX) 0.4 MG CAPS capsule Take 1 capsule (0.4 mg total) by mouth daily after breakfast. 10/05/18   Narda BondsNettey, Ralph A, MD    Family History Family History  Problem Relation Age of Onset  . Hypertension Other   . Diabetes Other     Social History Social History   Tobacco Use  . Smoking status: Current Every Day Smoker    Packs/day: 1.00    Years: 20.00    Pack years: 20.00    Types: Cigarettes  . Smokeless tobacco: Current User  Substance Use Topics  . Alcohol use: Yes    Comment: Daily Drinker   . Drug use: Yes    Frequency: 7.0  times per week    Types: "Crack" cocaine, Cocaine, Marijuana     Allergies   Haldol [haloperidol]   Review of Systems Review of Systems Ten systems reviewed and are negative for acute change, except as noted in the HPI.    Physical Exam Updated Vital Signs BP (!) 160/100   Pulse 97   Temp (!) 97.1 F (36.2 C)   Resp 17   Ht 5\' 8"  (1.727 m)   Wt 88.5 kg   SpO2 93%   BMI 29.67 kg/m   Physical Exam Vitals signs and nursing note reviewed.  Constitutional:      General: He is not in acute distress.    Appearance: He is well-developed. He is not diaphoretic.     Comments: Nontoxic appearing and in NAD  HENT:     Head: Normocephalic and atraumatic.  Eyes:  General: No scleral icterus.    Conjunctiva/sclera: Conjunctivae normal.  Neck:     Musculoskeletal: Normal range of motion.  Cardiovascular:     Rate and Rhythm: Normal rate and regular rhythm.     Pulses: Normal pulses.  Pulmonary:     Effort: Pulmonary effort is normal. No respiratory distress.     Comments: Respirations even and unlabored Genitourinary:    Comments: Some swelling to the proximal and ventral penile shaft. There is an ulceration to the distal shaft which abuts the base of the glans penis on the right. No active drainage. No heat to touch. No significant scrotal edema. Exam chaperoned by RN.   Photos in [employer] tab for reference and to chronicle healing. Musculoskeletal: Normal range of motion.     Right lower leg: Edema present.     Left lower leg: Edema present.  Skin:    General: Skin is warm and dry.     Coloration: Skin is not pale.     Findings: No erythema or rash.  Neurological:     General: No focal deficit present.     Mental Status: He is alert and oriented to person, place, and time.     Coordination: Coordination normal.     Comments: GCS 15.  Answers questions appropriately.  Follows commands.  Psychiatric:        Behavior: Behavior normal.      ED Treatments /  Results  Labs (all labs ordered are listed, but only abnormal results are displayed) Labs Reviewed  CBC - Abnormal; Notable for the following components:      Result Value   Hemoglobin 10.2 (*)    HCT 35.2 (*)    MCH 24.0 (*)    MCHC 29.0 (*)    RDW 17.9 (*)    All other components within normal limits  BASIC METABOLIC PANEL - Abnormal; Notable for the following components:   Potassium 3.4 (*)    Glucose, Bld 330 (*)    BUN 21 (*)    Creatinine, Ser 1.26 (*)    All other components within normal limits  BRAIN NATRIURETIC PEPTIDE    EKG None  Radiology No results found.  Procedures Procedures (including critical care time)  Medications Ordered in ED Medications  insulin glargine (LANTUS) injection 14 Units (has no administration in time range)  furosemide (LASIX) injection 40 mg (40 mg Intravenous Given 10/14/18 0620)  sulfamethoxazole-trimethoprim (BACTRIM DS,SEPTRA DS) 800-160 MG per tablet 1 tablet (1 tablet Oral Given 10/14/18 0619)  cephALEXin (KEFLEX) capsule 500 mg (500 mg Oral Given 10/14/18 0619)  acetaminophen (TYLENOL) tablet 1,000 mg (1,000 mg Oral Given 10/14/18 8921)     Initial Impression / Assessment and Plan / ED Course  I have reviewed the triage vital signs and the nursing notes.  Pertinent labs & imaging results that were available during my care of the patient were reviewed by me and considered in my medical decision making (see chart for details).        58 year old male, well-known to the emergency department presents to the ED for complaints of chronic lower extremity swelling as well as scrotal and penile swelling.  Was admitted 2 days ago for penile cellulitis.  Overall, the area appears to be improving.  There is scant discharge noted.  He was given oral antibiotics in the emergency department.  No fevers or leukocytosis.  With regard to known history of CHF, patient has no complaints of chest pain or shortness of breath.  He has no hypoxia in  the emergency department and his blood pressure is stable compared to prior visits.  He was given 1 dose of IV Lasix given history of medication noncompliance.  If BNP is stable or improved compared to 48 hours prior, stable for discharge with outpatient primary care follow-up to continue oral antibiotics.   Final Clinical Impressions(s) / ED Diagnoses   Final diagnoses:  Penile cellulitis  Chronic congestive heart failure, unspecified heart failure type [facility])    ED Discharge Orders         Ordered    sulfamethoxazole-trimethoprim (BACTRIM DS,SEPTRA DS) 800-160 MG tablet  2 times daily     10/14/18 0658    cephALEXin (KEFLEX) 500 MG capsule  4 times daily     10/14/18 0658           [name], PA-C 10/14/18 1610

## 2018-10-14 NOTE — ED Notes (Signed)
Does ambulate to commode to urinate without need for (A).

## 2018-10-14 NOTE — Discharge Instructions (Addendum)
You have been given a prescription for Bactrim and Keflex.  Pick these up from your pharmacy and take as prescribed for management of your infection.  Continue your daily medications including your Lasix.  Follow-up with your primary care doctor.

## 2018-10-17 ENCOUNTER — Encounter (HOSPITAL_COMMUNITY): Payer: Self-pay | Admitting: Emergency Medicine

## 2018-10-17 ENCOUNTER — Other Ambulatory Visit: Payer: Self-pay

## 2018-10-17 ENCOUNTER — Emergency Department (HOSPITAL_COMMUNITY): Payer: Medicare Other

## 2018-10-17 ENCOUNTER — Inpatient Hospital Stay (HOSPITAL_COMMUNITY)
Admission: EM | Admit: 2018-10-17 | Discharge: 2018-10-19 | DRG: 292 | Disposition: A | Discharge status: Home / Self Care | Payer: Medicare Other | Attending: Family Medicine | Admitting: Family Medicine

## 2018-10-17 ENCOUNTER — Emergency Department (HOSPITAL_COMMUNITY)
Admission: EM | Admit: 2018-10-17 | Discharge: 2018-10-17 | Disposition: A | Discharge status: Home / Self Care | Payer: Medicaid Other | Attending: Emergency Medicine | Admitting: Emergency Medicine

## 2018-10-17 ENCOUNTER — Encounter (HOSPITAL_COMMUNITY): Payer: Self-pay

## 2018-10-17 DIAGNOSIS — F1010 Alcohol abuse, uncomplicated: Secondary | ICD-10-CM | POA: Diagnosis present

## 2018-10-17 DIAGNOSIS — Z794 Long term (current) use of insulin: Secondary | ICD-10-CM | POA: Insufficient documentation

## 2018-10-17 DIAGNOSIS — F419 Anxiety disorder, unspecified: Secondary | ICD-10-CM | POA: Diagnosis present

## 2018-10-17 DIAGNOSIS — G8929 Other chronic pain: Secondary | ICD-10-CM | POA: Diagnosis present

## 2018-10-17 DIAGNOSIS — F17210 Nicotine dependence, cigarettes, uncomplicated: Secondary | ICD-10-CM | POA: Diagnosis present

## 2018-10-17 DIAGNOSIS — R7989 Other specified abnormal findings of blood chemistry: Secondary | ICD-10-CM | POA: Diagnosis present

## 2018-10-17 DIAGNOSIS — G4730 Sleep apnea, unspecified: Secondary | ICD-10-CM | POA: Diagnosis present

## 2018-10-17 DIAGNOSIS — I110 Hypertensive heart disease with heart failure: Secondary | ICD-10-CM | POA: Diagnosis not present

## 2018-10-17 DIAGNOSIS — N400 Enlarged prostate without lower urinary tract symptoms: Secondary | ICD-10-CM | POA: Diagnosis present

## 2018-10-17 DIAGNOSIS — Z888 Allergy status to other drugs, medicaments and biological substances status: Secondary | ICD-10-CM

## 2018-10-17 DIAGNOSIS — F17220 Nicotine dependence, chewing tobacco, uncomplicated: Secondary | ICD-10-CM | POA: Diagnosis not present

## 2018-10-17 DIAGNOSIS — F1420 Cocaine dependence, uncomplicated: Secondary | ICD-10-CM | POA: Diagnosis present

## 2018-10-17 DIAGNOSIS — R600 Localized edema: Secondary | ICD-10-CM

## 2018-10-17 DIAGNOSIS — F250 Schizoaffective disorder, bipolar type: Secondary | ICD-10-CM | POA: Diagnosis present

## 2018-10-17 DIAGNOSIS — Z720 Tobacco use: Secondary | ICD-10-CM | POA: Diagnosis present

## 2018-10-17 DIAGNOSIS — Z833 Family history of diabetes mellitus: Secondary | ICD-10-CM

## 2018-10-17 DIAGNOSIS — Z5900 Homelessness unspecified: Secondary | ICD-10-CM

## 2018-10-17 DIAGNOSIS — Z7982 Long term (current) use of aspirin: Secondary | ICD-10-CM

## 2018-10-17 DIAGNOSIS — Z79899 Other long term (current) drug therapy: Secondary | ICD-10-CM | POA: Insufficient documentation

## 2018-10-17 DIAGNOSIS — I509 Heart failure, unspecified: Secondary | ICD-10-CM

## 2018-10-17 DIAGNOSIS — I2510 Atherosclerotic heart disease of native coronary artery without angina pectoris: Secondary | ICD-10-CM | POA: Diagnosis present

## 2018-10-17 DIAGNOSIS — E119 Type 2 diabetes mellitus without complications: Secondary | ICD-10-CM

## 2018-10-17 DIAGNOSIS — I5042 Chronic combined systolic (congestive) and diastolic (congestive) heart failure: Secondary | ICD-10-CM | POA: Insufficient documentation

## 2018-10-17 DIAGNOSIS — R778 Other specified abnormalities of plasma proteins: Secondary | ICD-10-CM | POA: Diagnosis present

## 2018-10-17 DIAGNOSIS — E1165 Type 2 diabetes mellitus with hyperglycemia: Secondary | ICD-10-CM | POA: Diagnosis present

## 2018-10-17 DIAGNOSIS — I5043 Acute on chronic combined systolic (congestive) and diastolic (congestive) heart failure: Secondary | ICD-10-CM | POA: Diagnosis present

## 2018-10-17 DIAGNOSIS — I472 Ventricular tachycardia: Secondary | ICD-10-CM | POA: Diagnosis present

## 2018-10-17 DIAGNOSIS — Z9114 Patient's other noncompliance with medication regimen: Secondary | ICD-10-CM

## 2018-10-17 DIAGNOSIS — Z8249 Family history of ischemic heart disease and other diseases of the circulatory system: Secondary | ICD-10-CM

## 2018-10-17 DIAGNOSIS — N4889 Other specified disorders of penis: Secondary | ICD-10-CM | POA: Diagnosis not present

## 2018-10-17 DIAGNOSIS — B1920 Unspecified viral hepatitis C without hepatic coma: Secondary | ICD-10-CM | POA: Diagnosis present

## 2018-10-17 DIAGNOSIS — I700 Atherosclerosis of aorta: Secondary | ICD-10-CM | POA: Diagnosis present

## 2018-10-17 DIAGNOSIS — I10 Essential (primary) hypertension: Secondary | ICD-10-CM | POA: Diagnosis present

## 2018-10-17 DIAGNOSIS — E785 Hyperlipidemia, unspecified: Secondary | ICD-10-CM | POA: Diagnosis present

## 2018-10-17 DIAGNOSIS — J449 Chronic obstructive pulmonary disease, unspecified: Secondary | ICD-10-CM | POA: Diagnosis present

## 2018-10-17 DIAGNOSIS — N4822 Cellulitis of corpus cavernosum and penis: Secondary | ICD-10-CM | POA: Diagnosis present

## 2018-10-17 LAB — BASIC METABOLIC PANEL
Anion gap: 11 (ref 5–15)
BUN: 25 mg/dL — ABNORMAL HIGH (ref 6–20)
CO2: 23 mmol/L (ref 22–32)
Calcium: 8.7 mg/dL — ABNORMAL LOW (ref 8.9–10.3)
Chloride: 102 mmol/L (ref 98–111)
Creatinine, Ser: 1.24 mg/dL (ref 0.61–1.24)
GFR calc Af Amer: >60 mL/min (ref >60)
GFR calc non Af Amer: >60 mL/min (ref >60)
Glucose, Bld: 335 mg/dL — ABNORMAL HIGH (ref 70–99)
Potassium: 4 mmol/L (ref 3.5–5.1)
Sodium: 136 mmol/L (ref 135–145)

## 2018-10-17 MED ORDER — CEPHALEXIN 500 MG PO CAPS: 500.0000 mg | ORAL_CAPSULE | Freq: Three times a day (TID) | ORAL | 0 refills | Status: DC

## 2018-10-17 MED ORDER — SULFAMETHOXAZOLE-TRIMETHOPRIM 800-160 MG PO TABS
2.0000 | ORAL_TABLET | Freq: Once | ORAL | Status: AC
  Administered 2018-10-17: 2 via ORAL
  Filled 2018-10-17: qty 2

## 2018-10-17 MED ORDER — ACETAMINOPHEN 325 MG PO TABS
650.0000 mg | ORAL_TABLET | Freq: Once | ORAL | Status: AC
  Administered 2018-10-18: 650 mg via ORAL
  Filled 2018-10-17: qty 2

## 2018-10-17 MED ORDER — CEPHALEXIN 250 MG PO CAPS
1500.0000 mg | ORAL_CAPSULE | Freq: Once | ORAL | Status: AC
  Administered 2018-10-17: 1500 mg via ORAL
  Filled 2018-10-17: qty 6

## 2018-10-17 MED ORDER — SULFAMETHOXAZOLE-TRIMETHOPRIM 800-160 MG PO TABS
1.0000 | ORAL_TABLET | Freq: Once | ORAL | Status: AC
  Administered 2018-10-17: 1 via ORAL
  Filled 2018-10-17: qty 1

## 2018-10-17 MED ORDER — POTASSIUM CHLORIDE CRYS ER 20 MEQ PO TBCR
20.0000 meq | EXTENDED_RELEASE_TABLET | Freq: Once | ORAL | Status: AC
  Administered 2018-10-17: 20 meq via ORAL
  Filled 2018-10-17: qty 1

## 2018-10-17 MED ORDER — CLOTRIMAZOLE 1 % EX CREA
TOPICAL_CREAM | Freq: Two times a day (BID) | CUTANEOUS | Status: DC
  Administered 2018-10-17: 1 via TOPICAL
  Filled 2018-10-17: qty 15

## 2018-10-17 MED ORDER — SULFAMETHOXAZOLE-TRIMETHOPRIM 800-160 MG PO TABS: 1.0000 | ORAL_TABLET | Freq: Two times a day (BID) | ORAL | 0 refills | Status: DC

## 2018-10-17 MED ORDER — FUROSEMIDE 20 MG PO TABS
40.0000 mg | ORAL_TABLET | Freq: Once | ORAL | Status: AC
  Administered 2018-10-17: 40 mg via ORAL
  Filled 2018-10-17: qty 2

## 2018-10-17 MED ORDER — CEPHALEXIN 250 MG PO CAPS
500.0000 mg | ORAL_CAPSULE | Freq: Once | ORAL | Status: AC
  Administered 2018-10-17: 500 mg via ORAL
  Filled 2018-10-17: qty 2

## 2018-10-17 NOTE — ED Notes (Signed)
Bed: VX79 Expected date:  Expected time:  Means of arrival:  Comments: 64M hyperglycemia

## 2018-10-17 NOTE — ED Notes (Addendum)
Feet cleaned and fresh socks given. No lacerations noted but very dried cracked heels. Pt discharge instructions discussed and medication and instructions with perscription given. Instructions about follow up discussed. Pt verbalizes understanding but does not sign

## 2018-10-17 NOTE — ED Provider Notes (Signed)
[facility] EMERGENCY DEPARTMENT Provider Note   CSN: [id number] Arrival date & time: 10/17/18  1451    History   Chief Complaint Chief Complaint  Patient presents with   Leg Swelling    HPI [name] is a 58 y.o. male.     HPI   Patient is a 58 year old male with past medical history of diabetes mellitus, hypertension, combined systolic and diastolic CHF, hepatitis C, cocaine use presenting for leg swelling and penile discomfort.  Patient reports that over the past week he has been treated both inpatient and outpatient for penile cellulitis.  He reports that he lost the prescription for antibiotics that he was given 2 days ago.  Patient reports that he has been taking his Lasix but feels that he is continued to have swelling in his legs despite this.  He denies any fever or chills.  He denies any increasing erythema of the scrotum or lower extremities.  Reports some shortness of breath at baseline, but no other increasing shortness of breath or chest pain.  Denies cough, congestion, rhinorrhea.  Past Medical History:  Diagnosis Date   Chronic foot pain    Cocaine abuse (HCC)    Diabetes mellitus without complication (HCC)    Hepatitis C    unsure    Homelessness    Hypertension    Neuropathy    Polysubstance abuse (HCC)    Schizophrenia (HCC)    Sleep apnea    Systolic and diastolic CHF, chronic (HCC)     Patient Active Problem List   Diagnosis Date Noted   Hypertensive urgency 10/12/2018   Mild renal insufficiency 10/12/2018   Cellulitis 10/12/2018   Penile cellulitis    Adjustment disorder with mixed disturbance of emotions and conduct    Sleep apnea 10/03/2018   Hypokalemia 09/17/2018   Scrotal swelling 09/17/2018   Urinary hesitancy    Constipation    Acute on chronic combined systolic and diastolic CHF (congestive heart failure) (HCC) 08/25/2018   Acute respiratory failure with hypoxia (HCC) 08/25/2018    Acute on chronic combined systolic and diastolic congestive heart failure (HCC) 08/18/2018   Tobacco use 08/18/2018   Acute on chronic combined systolic (congestive) and diastolic (congestive) heart failure (HCC) 08/08/2018   Homelessness 08/08/2018   Smoker 08/08/2018   Acute exacerbation of CHF (congestive heart failure) (HCC) 03/28/2018   Prostate enlargement 03/16/2018   Aortic atherosclerosis (HCC) 03/16/2018   Aneurysm of abdominal aorta (HCC) 03/16/2018   Chronic foot pain    Schizoaffective disorder, bipolar type (HCC) 09/30/2016   Substance induced mood disorder (HCC) 03/13/2015   Acute kidney failure (HCC) 01/26/2015   Schizophrenia, paranoid type (HCC) 01/17/2015   Drug hallucinosis (HCC) 10/08/2014   Chronic paranoid schizophrenia (HCC) 09/07/2014   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 Authorizing Provider  ARIPiprazole (ABILIFY) 10 MG tablet Take 1 tablet (10 mg total) by mouth daily. 09/18/18   Meredeth Ide, MD  aspirin EC 81 MG tablet Take 1 tablet (81 mg total) by mouth daily  for 30 days. 09/18/18 10/18/18  Meredeth IdeLama, Gagan S, MD  atorvastatin (LIPITOR) 40 MG tablet Take 1 tablet (40 mg total) by mouth daily. 09/18/18   Meredeth IdeLama, Gagan S, MD  bisacodyl (DULCOLAX) 5 MG EC tablet Take 1 tablet (5 mg total) by mouth daily as needed for moderate constipation. 09/04/18   Rodolph Bonghompson, Daniel V, MD  carbamazepine (TEGRETOL) 200 MG tablet Take 0.5 tablets (100 mg total) by mouth 2 (two) times daily. 09/18/18   Meredeth IdeLama, Gagan S,  MD  cephALEXin (KEFLEX) 500 MG capsule Take 1 capsule (500 mg total) by mouth 4 (four) times daily for 7 days. 10/14/18 10/21/18  Antony MaduraHumes, Kelly, PA-C  furosemide (LASIX) 40 MG tablet Take 1 tablet (40 mg total) by mouth daily. 09/24/18   Derwood KaplanNanavati, Ankit, MD  hydrocortisone (PROCTO-PAK) 1 % CREA Apply twice a day to hemorrhoids. 09/29/18   Renne CriglerGeiple, Joshua, PA-C  Insulin Glargine (LANTUS) 100 UNIT/ML Solostar Pen Inject 14 Units into the skin daily. 09/18/18   Meredeth IdeLama, Gagan S, MD  Insulin Pen Needle 29G X 8MM MISC Use with insulin 09/18/18   Meredeth IdeLama, Gagan S, MD  lisinopril (PRINIVIL,ZESTRIL) 10 MG tablet Take 1 tablet (10 mg total) by mouth daily. 09/18/18   Meredeth IdeLama, Gagan S, MD  nicotine (NICODERM CQ - DOSED IN MG/24 HOURS) 21 mg/24hr patch Place 1 patch (21 mg total) onto the skin daily. 09/04/18   Rodolph Bonghompson, Daniel V, MD  polyethylene glycol [facility](MIRALAX / Ethelene HalGLYCOLAX) packet Take 17 g by mouth 2 (two) times daily. 09/18/18   Meredeth IdeLama, Gagan S, MD  Potassium Chloride ER 20 MEQ TBCR Take 20 mEq by mouth daily. 09/18/18   Meredeth IdeLama, Gagan S, MD  senna-docusate (SENOKOT-S) 8.6-50 MG tablet Take 1 tablet by mouth 2 (two) times daily. 09/04/18   Rodolph Bonghompson, Daniel V, MD  sulfamethoxazole-trimethoprim (BACTRIM DS,SEPTRA DS) 800-160 MG tablet Take 1 tablet by mouth 2 (two) times daily for 7 days. 10/14/18 10/21/18  Antony MaduraHumes, Kelly, PA-C  tamsulosin (FLOMAX) 0.4 MG CAPS capsule Take 1 capsule (0.4 mg total) by mouth daily after breakfast. 10/05/18   Narda BondsNettey, Ralph A, MD    Family History Family History  Problem Relation Age of Onset   Hypertension Other    Diabetes Other     Social History Social History   Tobacco Use   Smoking status: Current Every Day Smoker    Packs/day: 1.00    Years: 20.00    Pack years: 20.00    Types: Cigarettes   Smokeless tobacco: Current User  Substance Use Topics   Alcohol use: Yes    Comment: Daily Drinker    Drug use: Yes    Frequency: 7.0 times per week    Types: "Crack" cocaine, Cocaine, Marijuana      Allergies   Haldol [haloperidol]   Review of Systems Review of Systems  Constitutional: Negative for chills and fever.  HENT: Negative for congestion and rhinorrhea.   Respiratory: Negative for cough, chest tightness and shortness of breath.   Cardiovascular: Positive for leg swelling. Negative for chest pain.  Gastrointestinal: Negative for abdominal pain, nausea and vomiting.  Genitourinary: Positive for discharge and penile swelling.  All other systems reviewed and are negative.    Physical Exam Updated Vital Signs BP (!) 143/96 (BP Location: Right Arm)    Pulse 98    Temp 98.9 F (37.2 C) (Oral)    Resp 18    SpO2 98%   Physical Exam Vitals signs and nursing note reviewed.  Constitutional:  General: He is not in acute distress.    Appearance: He is well-developed.  HENT:     Head: Normocephalic and atraumatic.     Mouth/Throat:     Mouth: Mucous membranes are moist.  Eyes:     Conjunctiva/sclera: Conjunctivae normal.     Pupils: Pupils are equal, round, and reactive to light.  Neck:     Musculoskeletal: Normal range of motion and neck supple.  Cardiovascular:     Rate and Rhythm: Normal rate and regular rhythm.     Heart sounds: S1 normal and S2 normal. No murmur.  Pulmonary:     Effort: Pulmonary effort is normal.     Breath sounds: Normal breath sounds. No wheezing or rales.  Abdominal:     General: There is no distension.     Palpations: Abdomen is soft.     Tenderness: There is no abdominal tenderness. There is no guarding.  Genitourinary:    Comments: GU exam performed with attending physician as chaperone.  Patient has generalized edema of the penis. No swelling of scrotum.  No erythema scrotum or penis.  Patient has thick, creamy discharge in the invaginations of the penile shaft. No macerated tissue.  Musculoskeletal: Normal range of motion.        General: No deformity.  Lymphadenopathy:     Cervical: No cervical adenopathy.  Skin:     General: Skin is warm and dry.     Findings: No erythema or rash.  Neurological:     Mental Status: He is alert.     Comments: Cranial nerves grossly intact. Patient moves extremities symmetrically and with good coordination.  Psychiatric:        Behavior: Behavior normal.        Thought Content: Thought content normal.        Judgment: Judgment normal.      ED Treatments / Results  Labs (all labs ordered are listed, but only abnormal results are displayed) Labs Reviewed  BASIC METABOLIC PANEL    EKG None  Radiology No results found.  Procedures Procedures (including critical care time)  Medications Ordered in ED Medications  clotrimazole (LOTRIMIN) 1 % cream (has no administration in time range)     Initial Impression / Assessment and Plan / ED Course  I have reviewed the triage vital signs and the nursing notes.  Pertinent labs & imaging results that were available during my care of the patient were reviewed by me and considered in my medical decision making (see chart for details).  Clinical Course as of Oct 16 1801  Sat Oct 17, 2018  1640 No anion gap.   Glucose(!): 335 [AM]  1759 At baseline.   Creatinine: 1.24 [AM]  1759 Spoke with Cletis Athens, PharmD    [AM]    Clinical Course User Index [AM] Elisha Ponder, PA-C       Patient nontoxic-appearing, afebrile, and in no acute distress.  Normal vital signs.  No tachypnea, tachycardia nor hypoxia.  Normal lung exam.  Do not suspect pulmonary edema.  Lower extreme edema appears chronic secondary to patient's heart failure.  Patient's penile swelling appears consistent with images placed in the chart on 10-14-2018.  Question whether there may be an element of fungal infection.  Will prescribe clotrimazole.  Given patient's overall well appearance, will recheck renal function and give patient oral Lasix.  Will ensure that patient can urinate and that urethra is nonobstructive due to edema.  Patient has stable renal  function.  Patient  is able to urinate here.  Patient given dose of p.o. Lasix here in the emergency department.  Patient has not been taking his antibiotics, however his genital region does not appear cellulitic and no evidence of Fournier's gangrene.  Will have patient continue the antibiotics as an outpatient.    Case management has gone home for the day, however did discuss with the patient that he should follow-up with the [facility] for obtaining his prescriptions and he was given 1 day of his medications to get him to Monday.  Patient was also evaluated by social work today to provide him resources for housing.  His cousin was also contacted who states that he can come to her house via the bus.    This is a shared visit with Dr. Eber Hong. Patient was independently evaluated by this attending physician. Attending physician consulted in evaluation and discharge management.  Final Clinical Impressions(s) / ED Diagnoses   Final diagnoses:  Lower extremity edema  Penile swelling    ED Discharge Orders         Ordered    cephALEXin (KEFLEX) 500 MG capsule  3 times daily     10/17/18 1802    sulfamethoxazole-trimethoprim (BACTRIM DS) 800-160 MG tablet  2 times daily     10/17/18 1802           Delia Chimes 10/17/18 1811    Eber Hong, MD 10/19/18 1148

## 2018-10-17 NOTE — ED Provider Notes (Signed)
Medical screening examination/treatment/procedure(s) were conducted as a shared visit with non-physician practitioner(s) and myself.  I personally evaluated the patient during the encounter.  Clinical Impression:   Final diagnoses:  Lower extremity edema  Penile swelling    Patient is well-known to the emergency department, 59 year old male, known history of congestive heart failure anasarca and persistent genital and peripheral edema.  Presents with same complaints.  States he has lost his antibiotic.  He is not febrile has a soft abdomen, has bilateral symmetrical pitting edema to just above the knees as well as some edema of the genital region especially the shaft of the penis.  There is no redness or induration, there is no drainage, no foul smell.  He is able to spontaneously pass urine.  His lungs are clear, vital signs are totally unremarkable without fever tachycardia or hypoxia, he is in no respiratory distress and I doubt that he is significantly volume overloaded compared to prior visits.  Reviewed images from prior chart, his exam today is consistent with that.  Will give dose of oral Lasix after renal function has been checked, make sure that he is able to pass urine otherwise you may have to place a catheter to facilitate urinary flow, the patient does not have a full lower abdomen and I doubt that he has urinary retention based on his clinical exam.  Patient agreeable     Eber Hong, MD 10/19/18 1147

## 2018-10-17 NOTE — ED Provider Notes (Signed)
[facility]-EMERGENCY DEPT Provider Note   CSN: [id number] Arrival date & time: 10/17/18  2149    History   Chief Complaint Chief Complaint  Patient presents with  . Hyperglycemia  . Foot Pain    HPI [name] is a 58 y.o. male.     Patient with past medical history of CHF, schizophrenia, penile cellulitis, and homelessness, who returns again to the emergency department today with a chief complaint of shortness of breath.  He was seen earlier today at [facility], and was discharged this afternoon.  He states that he has nowhere to stay, and states that his shortness of breath has been worsening.  Denies any fevers or chills.  Denies any chest pain, but does feel short of breath, which is worsened with exertion.  States that his legs are swollen.  He also continues to complain of mild discharge around his penis.  He states that he has lost his antibiotics.  The history is provided by the patient. No language interpreter was used.    Past Medical History:  Diagnosis Date  . Chronic foot pain   . Cocaine abuse (HCC)   . Diabetes mellitus without complication (HCC)   . Hepatitis C    unsure   . Homelessness   . Hypertension   . Neuropathy   . Polysubstance abuse (HCC)   . Schizophrenia (HCC)   . Sleep apnea   . Systolic and diastolic CHF, chronic [facility])     Patient Active Problem List   Diagnosis Date Noted  . Hypertensive urgency 10/12/2018  . Mild renal insufficiency 10/12/2018  . Cellulitis 10/12/2018  . Penile cellulitis   . Adjustment disorder with mixed disturbance of emotions and conduct   . Sleep apnea 10/03/2018  . Hypokalemia 09/17/2018  . Scrotal swelling 09/17/2018  . Urinary hesitancy   . Constipation   . Acute on chronic combined systolic and diastolic CHF (congestive heart failure) (HCC) 08/25/2018  . Acute respiratory failure with hypoxia (HCC) 08/25/2018  . Acute on chronic combined systolic and diastolic congestive  heart failure (HCC) 08/18/2018  . Tobacco use 08/18/2018  . 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 Homelessness 08/08/2018  . Smoker 08/08/2018  . Acute exacerbation of CHF (congestive heart failure) (HCC) 03/28/2018  . Prostate enlargement 03/16/2018  . Aortic atherosclerosis (HCC) 03/16/2018  . 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 Chronic foot pain   . Schizoaffective disorder, bipolar type (HCC) 09/30/2016  . Substance induced mood disorder (HCC) 03/13/2015  . Acute kidney failure (HCC) 01/26/2015  . Schizophrenia, paranoid type (HCC) 01/17/2015  . Drug hallucinosis (HCC) 10/08/2014  . Chronic paranoid schizophrenia (HCC) 09/07/2014  . 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 Urinary retention   . Cocaine use disorder, severe, dependence (HCC)   . Essential hypertension, benign 03/28/2013  . 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 MULTIPLE TOOTH EXTRACTIONS          Home Medications    Prior to Admission medications   Medication Sig Start Date End Date Taking? Authorizing Provider  ARIPiprazole (ABILIFY) 10 MG tablet Take 1 tablet (10 mg total) by mouth daily. 09/18/18   Meredeth Ide, MD  aspirin EC 81 MG tablet Take 1 tablet (81 mg total) by mouth daily for 30 days. 09/18/18 10/18/18  Meredeth Ide, MD  atorvastatin (LIPITOR) 40 MG tablet Take 1 tablet (40 mg total) by mouth daily. 09/18/18   Meredeth Ide, MD  bisacodyl (DULCOLAX) 5 MG EC tablet Take 1 tablet (5 mg total) by mouth daily as needed for moderate constipation. 09/04/18   Rodolph Bong, MD  carbamazepine (TEGRETOL) 200 MG tablet Take 0.5 tablets (100 mg total) by mouth 2 (two) times daily. 09/18/18   Meredeth Ide, MD  cephALEXin (KEFLEX) 500 MG capsule Take 1 capsule (500  mg total) by mouth 3 (three) times daily for 6 days. 10/17/18 10/23/18  Aviva Kluver B, PA-C  furosemide (LASIX) 40 MG tablet Take 1 tablet (40 mg total) by mouth daily. 09/24/18   Derwood Kaplan, MD  hydrocortisone (PROCTO-PAK) 1 % CREA Apply twice a day to hemorrhoids. 09/29/18   Renne Crigler, PA-C  Insulin Glargine (LANTUS) 100 UNIT/ML Solostar Pen Inject 14 Units into the skin daily. 09/18/18   Meredeth Ide, MD  Insulin Pen Needle 29G X MISC Use with insulin 09/18/18   Meredeth Ide, MD  lisinopril (PRINIVIL,ZESTRIL) 10 MG tablet Take 1 tablet (10 mg total) by mouth daily. 09/18/18   Meredeth Ide, MD  nicotine (NICODERM CQ - DOSED IN MG/24 HOURS) 21 mg/24hr patch Place 1 patch (21 mg total) onto the skin daily. 09/04/18   Rodolph Bong, MD  polyethylene glycol [facility] Ethelene Hal) packet Take 17 g by mouth 2 (two) times daily. 09/18/18   Meredeth Ide, MD  Potassium Chloride ER 20 MEQ TBCR Take 20 mEq by mouth daily. 09/18/18   Meredeth Ide, MD  senna-docusate (SENOKOT-S) 8.6-50 MG tablet Take 1 tablet by mouth 2 (two) times daily. 09/04/18   Rodolph Bong, MD  sulfamethoxazole-trimethoprim (BACTRIM DS) 800-160 MG tablet Take 1 tablet by mouth 2 (two) times daily for 6 days. 10/17/18 10/23/18  Aviva Kluver B, PA-C  tamsulosin (FLOMAX) 0.4 MG CAPS capsule Take 1 capsule (0.4 mg total) by mouth daily after breakfast. 10/05/18   Narda Bonds, MD    Family History Family History  Problem Relation Age of Onset  . Hypertension Other   . Diabetes Other     Social History Social History   Tobacco Use  . Smoking status: Current Every Day Smoker    Packs/day: 1.00    Years: 20.00    Pack years: 20.00    Types: Cigarettes  . Smokeless tobacco: Current User  Substance Use Topics  . Alcohol use: Yes    Comment: Daily Drinker   . Drug use: Yes    Frequency: 7.0 times per week    Types: "Crack" cocaine, Cocaine, Marijuana     Allergies   Haldol [haloperidol]   Review of  Systems Review of Systems  All other systems reviewed and are negative.    Physical Exam Updated Vital Signs BP (!) 159/95   Pulse 100   Temp 98.7 F (37.1 C) (Oral)   Resp 17   SpO2 95%   Physical Exam Vitals signs and nursing note reviewed.  Constitutional:      Appearance: He is well-developed.  HENT:     Head: Normocephalic and atraumatic.  Eyes:     Conjunctiva/sclera: Conjunctivae normal.  Neck:     Musculoskeletal: Neck supple.  Cardiovascular:     Rate and Rhythm: Normal rate and regular rhythm.     Heart sounds: No murmur.  Pulmonary:     Effort: Pulmonary effort is normal. No  respiratory distress.     Breath sounds: Normal breath sounds. No wheezing or rhonchi.  Abdominal:     Palpations: Abdomen is soft.     Tenderness: There is no abdominal tenderness.  Genitourinary:    Comments: Glans mildly swollen, with mild discharge, non-tender No perineal tenderness or evidence of Fournier's gangrene Musculoskeletal:     Right lower leg: Edema present.     Left lower leg: Edema present.     Comments: 3+ bilateral lower extremity pitting edema  Skin:    General: Skin is warm and dry.  Neurological:     Mental Status: He is alert.      ED Treatments / Results  Labs (all labs ordered are listed, but only abnormal results are displayed) Labs Reviewed  BRAIN NATRIURETIC PEPTIDE - Abnormal; Notable for the following components:      Result Value   B Natriuretic Peptide 1,095.1 (*)    All other components within normal limits  BASIC METABOLIC PANEL - Abnormal; Notable for the following components:   Glucose, Bld 372 (*)    BUN 29 (*)    Calcium 8.6 (*)    All other components within normal limits  CBC WITH DIFFERENTIAL/PLATELET - Abnormal; Notable for the following components:   RBC 3.89 (*)    Hemoglobin 9.6 (*)    HCT 32.6 (*)    MCH 24.7 (*)    MCHC 29.4 (*)    RDW 18.4 (*)    All other components within normal limits  TROPONIN I - Abnormal; Notable  for the following components:   Troponin I 0.03 (*)    All other components within normal limits    EKG EKG Interpretation  Date/Time:  Saturday October 17 2018 22:45:11 EDT Ventricular Rate:  97 PR Interval:    QRS Duration: 96 QT Interval:  355 QTC Calculation: 451 R Axis:   45 Text Interpretation:  Sinus rhythm Probable left atrial enlargement Anterior infarct, old Nonspecific T abnormalities, lateral leads No acute changes No significant change since last tracing Confirmed by Derwood Kaplan [phone]) on 10/17/2018 10:47:12 PM   Radiology Dg Chest Portable 1 View  Result Date: 10/17/2018 CLINICAL DATA:  Shortness of breath. EXAM: PORTABLE CHEST 1 VIEW COMPARISON:  10/09/2018 FINDINGS: The cardio pericardial silhouette is enlarged. There is pulmonary vascular congestion with interstitial pulmonary edema. Chronic atelectasis or linear scar in the left mid lung is similar to prior. The visualized bony structures of the thorax are intact. Insert wire IMPRESSION: Cardiomegaly with vascular congestion and probable interstitial edema. Electronically Signed   By: Kennith Center M.D.   On: 10/17/2018 23:14    Procedures Procedures (including critical care time)  Medications Ordered in ED Medications  aspirin EC tablet 81 mg (has no administration in time range)  sulfamethoxazole-trimethoprim (BACTRIM DS) 800-160 MG per tablet 1 tablet (has no administration in time range)  atorvastatin (LIPITOR) tablet 40 mg (has no administration in time range)  furosemide (LASIX) tablet 40 mg (has no administration in time range)  lisinopril (ZESTRIL) tablet 10 mg (has no administration in time range)  ARIPiprazole (ABILIFY) tablet 10 mg (has no administration in time range)  nicotine (NICODERM CQ - dosed in mg/24 hours) patch 21 mg (has no administration in time range)  Insulin Glargine (LANTUS) Solostar Pen 14 Units (has no administration in time range)  bisacodyl (DULCOLAX) EC tablet 5 mg (has no  administration in time range)  senna-docusate (Senokot-S) tablet 1 tablet (has no administration in time range)  tamsulosin (  FLOMAX) capsule 0.4 mg (has no administration in time range)  Insulin Pen Needle MISC 14 Units (has no administration in time range)  carbamazepine (TEGRETOL) tablet 100 mg (has no administration in time range)  Potassium Chloride ER TBCR 20 mEq (has no administration in time range)  hydrocortisone (PROCTOCORT) 1 % rectal cream (has no administration in time range)  acetaminophen (TYLENOL) tablet 650 mg (650 mg Oral Given 10/18/18 0302)  furosemide (LASIX) injection 80 mg (80 mg Intravenous Given 10/18/18 0303)     Initial Impression / Assessment and Plan / ED Course  I have reviewed the triage vital signs and the nursing notes.  Pertinent labs & imaging results that were available during my care of the patient were reviewed by me and considered in my medical decision making (see chart for details).        Patient with complaint of shortness of breath and lower extremity edema.  [facility]  During my evaluation, the patient's O2 saturation is 87 to 95% depending upon whether he is talking or not.  Does have significant lower extremity edema, which seems to be chronic for him.  He continues to have some mild discharge around the penile glans, but does not appear to have any abscess.  He reports having lost his antibiotics which she was taking for this.  Earlier today, he was given a dose of PO Lasix.  He states that he was supposed to go to his cousin's house, but ended up at the [facility] because his cousins significant other would not let them in the house.  Given patient's oxygenation in upper the 80s while talking with me, will check chest x-ray and repeat labs.  On reassessment, O2 saturation is in the mid-90s at rest, but given his significant risk factors, initial hypoxemia, CXR findings, feel that patient would likely benefit from  observation.  Final Clinical Impressions(s) / ED Diagnoses   Final diagnoses:  Acute on chronic congestive heart failure, unspecified heart failure type [facility](HCC)    ED Discharge Orders    None       Roxy HorsemanBrowning, Asmara Backs, PA-C 10/18/18 0355    Derwood KaplanNanavati, Ankit, MD 10/18/18 1712

## 2018-10-17 NOTE — ED Triage Notes (Signed)
Pt comes to ed via ems, homeless [facility], multiple complaints.  Hyperglycemia, foot pains, penile discomfort and rectal pains verbalized by ems. V/s on arrival 152/pal pluses 72, rr18, cgb 416.  Hx of diabetes, HTN, CHF, Psych and substance abuse. Care plan in place.  Alert x4, walked.

## 2018-10-17 NOTE — ED Notes (Addendum)
Pt given extensive instructions about medications, times and amounts to take. Information also written on container. Pt also given further food and drink. Pt eating applesauce and drinking juice. Tolerates well.

## 2018-10-17 NOTE — ED Triage Notes (Signed)
Pt c/o ankle/feet and scrotum swelling.

## 2018-10-17 NOTE — ED Notes (Addendum)
Cousin candace contacted and states Pt may go to her rental house. Pt is aware.States she is unable to pick him up. Pt stats he can take bus to house.

## 2018-10-17 NOTE — Discharge Instructions (Signed)
Please take this antibiotic prescription to the [facility] and fill on Monday.  I working with case management to help facilitate completing this prescription.  Please have all of your other medications filled at the [facility].  Please apply the cream that we gave you twice daily to the penis.  Return to the emergency department if you develop any worsening leg swelling, redness, shortness of breath, fevers or cough.  Thank you for allowing [place] to participate in your care today.'

## 2018-10-17 NOTE — ED Notes (Signed)
Pt stats he has had bowel movment in bed.Pt assisted with cleaning.

## 2018-10-17 NOTE — ED Notes (Addendum)
Pt standing at bedsode. peeing on floor. Stool noted on floor. Pt cursing at staff.

## 2018-10-17 NOTE — Progress Notes (Signed)
CSW was contacted regarding patient's medications. CSW inquired if they could dispense for 24-hour and informed that he did not have access to [employer] to discuss eligibility but will follow up with RNCM to follow up with patient. CSW provided patient with list of homelessness resources and noted patient is not suspected of having contracted COVID-19 at this time.  Tenna Delaine, LCSW, LCAS-A Clinical Social Worker II [phone]

## 2018-10-18 DIAGNOSIS — F250 Schizoaffective disorder, bipolar type: Secondary | ICD-10-CM

## 2018-10-18 DIAGNOSIS — R7989 Other specified abnormal findings of blood chemistry: Secondary | ICD-10-CM

## 2018-10-18 DIAGNOSIS — R778 Other specified abnormalities of plasma proteins: Secondary | ICD-10-CM | POA: Diagnosis present

## 2018-10-18 DIAGNOSIS — N4822 Cellulitis of corpus cavernosum and penis: Secondary | ICD-10-CM

## 2018-10-18 DIAGNOSIS — Z79899 Other long term (current) drug therapy: Secondary | ICD-10-CM | POA: Diagnosis not present

## 2018-10-18 DIAGNOSIS — E785 Hyperlipidemia, unspecified: Secondary | ICD-10-CM | POA: Diagnosis present

## 2018-10-18 DIAGNOSIS — I5043 Acute on chronic combined systolic (congestive) and diastolic (congestive) heart failure: Secondary | ICD-10-CM | POA: Diagnosis present

## 2018-10-18 DIAGNOSIS — Z590 Homelessness: Secondary | ICD-10-CM | POA: Diagnosis not present

## 2018-10-18 DIAGNOSIS — I10 Essential (primary) hypertension: Secondary | ICD-10-CM

## 2018-10-18 DIAGNOSIS — E119 Type 2 diabetes mellitus without complications: Secondary | ICD-10-CM | POA: Diagnosis not present

## 2018-10-18 DIAGNOSIS — I110 Hypertensive heart disease with heart failure: Secondary | ICD-10-CM | POA: Diagnosis not present

## 2018-10-18 DIAGNOSIS — F1010 Alcohol abuse, uncomplicated: Secondary | ICD-10-CM | POA: Diagnosis present

## 2018-10-18 DIAGNOSIS — I2510 Atherosclerotic heart disease of native coronary artery without angina pectoris: Secondary | ICD-10-CM | POA: Diagnosis present

## 2018-10-18 DIAGNOSIS — B1920 Unspecified viral hepatitis C without hepatic coma: Secondary | ICD-10-CM | POA: Diagnosis present

## 2018-10-18 DIAGNOSIS — I472 Ventricular tachycardia: Secondary | ICD-10-CM | POA: Diagnosis not present

## 2018-10-18 DIAGNOSIS — I509 Heart failure, unspecified: Secondary | ICD-10-CM | POA: Diagnosis present

## 2018-10-18 DIAGNOSIS — I700 Atherosclerosis of aorta: Secondary | ICD-10-CM | POA: Diagnosis present

## 2018-10-18 DIAGNOSIS — G4730 Sleep apnea, unspecified: Secondary | ICD-10-CM | POA: Diagnosis present

## 2018-10-18 DIAGNOSIS — Z7982 Long term (current) use of aspirin: Secondary | ICD-10-CM | POA: Diagnosis not present

## 2018-10-18 DIAGNOSIS — N400 Enlarged prostate without lower urinary tract symptoms: Secondary | ICD-10-CM | POA: Diagnosis present

## 2018-10-18 DIAGNOSIS — Z794 Long term (current) use of insulin: Secondary | ICD-10-CM | POA: Diagnosis not present

## 2018-10-18 DIAGNOSIS — Z888 Allergy status to other drugs, medicaments and biological substances status: Secondary | ICD-10-CM | POA: Diagnosis not present

## 2018-10-18 DIAGNOSIS — Z9114 Patient's other noncompliance with medication regimen: Secondary | ICD-10-CM | POA: Diagnosis not present

## 2018-10-18 DIAGNOSIS — F17210 Nicotine dependence, cigarettes, uncomplicated: Secondary | ICD-10-CM | POA: Diagnosis present

## 2018-10-18 DIAGNOSIS — F1420 Cocaine dependence, uncomplicated: Secondary | ICD-10-CM | POA: Diagnosis not present

## 2018-10-18 DIAGNOSIS — F419 Anxiety disorder, unspecified: Secondary | ICD-10-CM | POA: Diagnosis not present

## 2018-10-18 DIAGNOSIS — J449 Chronic obstructive pulmonary disease, unspecified: Secondary | ICD-10-CM | POA: Diagnosis present

## 2018-10-18 DIAGNOSIS — G8929 Other chronic pain: Secondary | ICD-10-CM | POA: Diagnosis present

## 2018-10-18 DIAGNOSIS — E1165 Type 2 diabetes mellitus with hyperglycemia: Secondary | ICD-10-CM | POA: Diagnosis present

## 2018-10-18 LAB — CBC
HCT: 32.7 % — ABNORMAL LOW (ref 39.0–52.0)
Hemoglobin: 9.5 g/dL — ABNORMAL LOW (ref 13.0–17.0)
MCH: 24.1 pg — ABNORMAL LOW (ref 26.0–34.0)
MCHC: 29.1 g/dL — ABNORMAL LOW (ref 30.0–36.0)
MCV: 83 fL (ref 80.0–100.0)
Platelets: 249 10*3/uL (ref 150–400)
RBC: 3.94 MIL/uL — ABNORMAL LOW (ref 4.22–5.81)
RDW: 18.2 % — ABNORMAL HIGH (ref 11.5–15.5)
WBC: 6.7 10*3/uL (ref 4.0–10.5)
nRBC: 0 % (ref 0.0–0.2)

## 2018-10-18 LAB — CREATININE, SERUM
Creatinine, Ser: 1.18 mg/dL (ref 0.61–1.24)
GFR calc Af Amer: >60 mL/min (ref >60)
GFR calc non Af Amer: >60 mL/min (ref >60)

## 2018-10-18 LAB — HEMOGLOBIN A1C
Hgb A1c MFr Bld: 10.2 % — ABNORMAL HIGH (ref 4.8–5.6)
Mean Plasma Glucose: 246.04 mg/dL

## 2018-10-18 LAB — CBC WITH DIFFERENTIAL/PLATELET
Abs Immature Granulocytes: 0.01 10*3/uL (ref 0.00–0.07)
Basophils Absolute: 0 10*3/uL (ref 0.0–0.1)
Basophils Relative: 1 %
Eosinophils Absolute: 0.2 10*3/uL (ref 0.0–0.5)
Eosinophils Relative: 3 %
HCT: 32.6 % — ABNORMAL LOW (ref 39.0–52.0)
Hemoglobin: 9.6 g/dL — ABNORMAL LOW (ref 13.0–17.0)
Immature Granulocytes: 0 %
Lymphocytes Relative: 11 %
Lymphs Abs: 0.8 10*3/uL (ref 0.7–4.0)
MCH: 24.7 pg — ABNORMAL LOW (ref 26.0–34.0)
MCHC: 29.4 g/dL — ABNORMAL LOW (ref 30.0–36.0)
MCV: 83.8 fL (ref 80.0–100.0)
Monocytes Absolute: 0.6 10*3/uL (ref 0.1–1.0)
Monocytes Relative: 9 %
Neutro Abs: 5.5 10*3/uL (ref 1.7–7.7)
Neutrophils Relative %: 76 %
Platelets: 245 10*3/uL (ref 150–400)
RBC: 3.89 MIL/uL — ABNORMAL LOW (ref 4.22–5.81)
RDW: 18.4 % — ABNORMAL HIGH (ref 11.5–15.5)
WBC: 7.2 10*3/uL (ref 4.0–10.5)
nRBC: 0 % (ref 0.0–0.2)

## 2018-10-18 LAB — BASIC METABOLIC PANEL
Anion gap: 7 (ref 5–15)
BUN: 29 mg/dL — ABNORMAL HIGH (ref 6–20)
CO2: 27 mmol/L (ref 22–32)
Calcium: 8.6 mg/dL — ABNORMAL LOW (ref 8.9–10.3)
Chloride: 101 mmol/L (ref 98–111)
Creatinine, Ser: 1.24 mg/dL (ref 0.61–1.24)
Glucose, Bld: 372 mg/dL — ABNORMAL HIGH (ref 70–99)
Potassium: 3.9 mmol/L (ref 3.5–5.1)
Sodium: 135 mmol/L (ref 135–145)

## 2018-10-18 LAB — TROPONIN I: Troponin I: 0.03 ng/mL (ref <0.03)

## 2018-10-18 LAB — CBG MONITORING, ED: Glucose-Capillary: 292 mg/dL — ABNORMAL HIGH (ref 70–99)

## 2018-10-18 LAB — GLUCOSE, CAPILLARY
Glucose-Capillary: 260 mg/dL — ABNORMAL HIGH (ref 70–99)
Glucose-Capillary: 176 mg/dL — ABNORMAL HIGH (ref 70–99)
Glucose-Capillary: 82 mg/dL (ref 70–99)

## 2018-10-18 LAB — BRAIN NATRIURETIC PEPTIDE: B Natriuretic Peptide: 1095.1 pg/mL — ABNORMAL HIGH (ref 0.0–100.0)

## 2018-10-18 LAB — MAGNESIUM: Magnesium: 2.2 mg/dL (ref 1.7–2.4)

## 2018-10-18 MED ORDER — ACETAMINOPHEN 650 MG RE SUPP: 650.0000 mg | Freq: Four times a day (QID) | RECTAL | Status: DC | PRN

## 2018-10-18 MED ORDER — ADULT MULTIVITAMIN W/MINERALS CH
1.0000 | ORAL_TABLET | Freq: Every day | ORAL | Status: DC
  Administered 2018-10-18 – 2018-10-19 (×2): 1 via ORAL
  Filled 2018-10-18 (×2): qty 1

## 2018-10-18 MED ORDER — SODIUM CHLORIDE 0.9% FLUSH
3.0000 mL | Freq: Two times a day (BID) | INTRAVENOUS | Status: DC
  Administered 2018-10-19: 3 mL via INTRAVENOUS

## 2018-10-18 MED ORDER — FUROSEMIDE 10 MG/ML IJ SOLN
60.0000 mg | Freq: Two times a day (BID) | INTRAMUSCULAR | Status: DC
  Administered 2018-10-18 – 2018-10-19 (×2): 60 mg via INTRAVENOUS
  Filled 2018-10-18 (×2): qty 6

## 2018-10-18 MED ORDER — ENOXAPARIN SODIUM 40 MG/0.4ML ~~LOC~~ SOLN
40.0000 mg | Freq: Every day | SUBCUTANEOUS | Status: DC
  Administered 2018-10-18 – 2018-10-19 (×2): 40 mg via SUBCUTANEOUS
  Filled 2018-10-18 (×2): qty 0.4

## 2018-10-18 MED ORDER — BISACODYL 5 MG PO TBEC: 5.0000 mg | DELAYED_RELEASE_TABLET | Freq: Every day | ORAL | Status: DC | PRN

## 2018-10-18 MED ORDER — FUROSEMIDE 10 MG/ML IJ SOLN: 120.0000 mg | INTRAVENOUS | Status: DC

## 2018-10-18 MED ORDER — INSULIN PEN NEEDLE 29G X 8MM MISC: 14.0000 [IU] | Freq: Every day | Status: DC

## 2018-10-18 MED ORDER — LORAZEPAM 2 MG/ML IJ SOLN
1.0000 mg | Freq: Four times a day (QID) | INTRAMUSCULAR | Status: DC | PRN
  Administered 2018-10-19: 09:00:00 1 mg via INTRAVENOUS
  Filled 2018-10-18: qty 1

## 2018-10-18 MED ORDER — SODIUM CHLORIDE 0.9 % IV SOLN: 250.0000 mL | INTRAVENOUS | Status: DC | PRN

## 2018-10-18 MED ORDER — TAMSULOSIN HCL 0.4 MG PO CAPS
0.4000 mg | ORAL_CAPSULE | Freq: Every day | ORAL | Status: DC
  Administered 2018-10-18 – 2018-10-19 (×2): 0.4 mg via ORAL
  Filled 2018-10-18 (×2): qty 1

## 2018-10-18 MED ORDER — FUROSEMIDE 10 MG/ML IJ SOLN
40.0000 mg | Freq: Two times a day (BID) | INTRAMUSCULAR | Status: DC
  Administered 2018-10-18: 40 mg via INTRAVENOUS
  Filled 2018-10-18: qty 4

## 2018-10-18 MED ORDER — VITAMIN B-1 100 MG PO TABS
100.0000 mg | ORAL_TABLET | Freq: Every day | ORAL | Status: DC
  Administered 2018-10-18 – 2018-10-19 (×2): 100 mg via ORAL
  Filled 2018-10-18 (×2): qty 1

## 2018-10-18 MED ORDER — LORAZEPAM 1 MG PO TABS
1.0000 mg | ORAL_TABLET | Freq: Four times a day (QID) | ORAL | Status: DC | PRN
  Administered 2018-10-18: 1 mg via ORAL
  Filled 2018-10-18: qty 1

## 2018-10-18 MED ORDER — ASPIRIN EC 81 MG PO TBEC
81.0000 mg | DELAYED_RELEASE_TABLET | Freq: Every day | ORAL | Status: DC
  Administered 2018-10-18 – 2018-10-19 (×2): 81 mg via ORAL
  Filled 2018-10-18 (×2): qty 1

## 2018-10-18 MED ORDER — ACETAMINOPHEN 325 MG PO TABS: 650.0000 mg | ORAL_TABLET | Freq: Four times a day (QID) | ORAL | Status: DC | PRN

## 2018-10-18 MED ORDER — HYDROCORTISONE 2.5 % RE CREA
TOPICAL_CREAM | Freq: Every day | RECTAL | Status: DC
  Filled 2018-10-18: qty 28.35

## 2018-10-18 MED ORDER — NICOTINE 21 MG/24HR TD PT24
21.0000 mg | MEDICATED_PATCH | Freq: Every day | TRANSDERMAL | Status: DC
  Filled 2018-10-18: qty 1

## 2018-10-18 MED ORDER — SENNOSIDES-DOCUSATE SODIUM 8.6-50 MG PO TABS
1.0000 | ORAL_TABLET | Freq: Two times a day (BID) | ORAL | Status: DC
  Administered 2018-10-18 – 2018-10-19 (×3): 1 via ORAL
  Filled 2018-10-18 (×3): qty 1

## 2018-10-18 MED ORDER — LISINOPRIL 10 MG PO TABS
10.0000 mg | ORAL_TABLET | Freq: Every day | ORAL | Status: DC
  Administered 2018-10-18 – 2018-10-19 (×2): 10 mg via ORAL
  Filled 2018-10-18 (×2): qty 1

## 2018-10-18 MED ORDER — THIAMINE HCL 100 MG/ML IJ SOLN
100.0000 mg | Freq: Every day | INTRAMUSCULAR | Status: DC
  Filled 2018-10-18: qty 2

## 2018-10-18 MED ORDER — FUROSEMIDE 10 MG/ML IJ SOLN
80.0000 mg | INTRAMUSCULAR | Status: AC
  Administered 2018-10-18: 03:00:00 80 mg via INTRAVENOUS
  Filled 2018-10-18: qty 8

## 2018-10-18 MED ORDER — PROMETHAZINE HCL 25 MG PO TABS: 12.5000 mg | ORAL_TABLET | Freq: Four times a day (QID) | ORAL | Status: DC | PRN

## 2018-10-18 MED ORDER — SULFAMETHOXAZOLE-TRIMETHOPRIM 800-160 MG PO TABS
1.0000 | ORAL_TABLET | Freq: Two times a day (BID) | ORAL | Status: DC
  Administered 2018-10-18 – 2018-10-19 (×3): 1 via ORAL
  Filled 2018-10-18 (×3): qty 1

## 2018-10-18 MED ORDER — TRAMADOL HCL 50 MG PO TABS: 50.0000 mg | ORAL_TABLET | Freq: Four times a day (QID) | ORAL | Status: DC | PRN

## 2018-10-18 MED ORDER — CARBAMAZEPINE 200 MG PO TABS
100.0000 mg | ORAL_TABLET | Freq: Two times a day (BID) | ORAL | Status: DC
  Administered 2018-10-18 – 2018-10-19 (×3): 100 mg via ORAL
  Filled 2018-10-18 (×3): qty 0.5

## 2018-10-18 MED ORDER — ARIPIPRAZOLE 10 MG PO TABS
10.0000 mg | ORAL_TABLET | Freq: Every day | ORAL | Status: DC
  Administered 2018-10-18 – 2018-10-19 (×2): 10 mg via ORAL
  Filled 2018-10-18 (×2): qty 1

## 2018-10-18 MED ORDER — INSULIN GLARGINE 100 UNIT/ML ~~LOC~~ SOLN
14.0000 [IU] | Freq: Every day | SUBCUTANEOUS | Status: DC
  Administered 2018-10-18 – 2018-10-19 (×2): 14 [IU] via SUBCUTANEOUS
  Filled 2018-10-18 (×2): qty 0.14

## 2018-10-18 MED ORDER — ATORVASTATIN CALCIUM 40 MG PO TABS
40.0000 mg | ORAL_TABLET | Freq: Every day | ORAL | Status: DC
  Administered 2018-10-18: 40 mg via ORAL
  Filled 2018-10-18: qty 1

## 2018-10-18 MED ORDER — SODIUM CHLORIDE 0.9% FLUSH
3.0000 mL | Freq: Two times a day (BID) | INTRAVENOUS | Status: DC
  Administered 2018-10-18 (×2): 3 mL via INTRAVENOUS

## 2018-10-18 MED ORDER — FUROSEMIDE 40 MG PO TABS: 40.0000 mg | ORAL_TABLET | Freq: Every day | ORAL | Status: DC

## 2018-10-18 MED ORDER — SODIUM CHLORIDE 0.9% FLUSH: 3.0000 mL | INTRAVENOUS | Status: DC | PRN

## 2018-10-18 MED ORDER — ADULT MULTIVITAMIN W/MINERALS CH: 1.0000 | ORAL_TABLET | Freq: Every day | ORAL | Status: DC

## 2018-10-18 MED ORDER — FOLIC ACID 1 MG PO TABS
1.0000 mg | ORAL_TABLET | Freq: Every day | ORAL | Status: DC
  Administered 2018-10-18 – 2018-10-19 (×2): 1 mg via ORAL
  Filled 2018-10-18 (×2): qty 1

## 2018-10-18 MED ORDER — INSULIN ASPART 100 UNIT/ML ~~LOC~~ SOLN
0.0000 [IU] | Freq: Three times a day (TID) | SUBCUTANEOUS | Status: DC
  Administered 2018-10-18: 3 [IU] via SUBCUTANEOUS
  Administered 2018-10-18: 8 [IU] via SUBCUTANEOUS
  Administered 2018-10-19: 2 [IU] via SUBCUTANEOUS
  Administered 2018-10-19: 8 [IU] via SUBCUTANEOUS

## 2018-10-18 MED ORDER — POTASSIUM CHLORIDE CRYS ER 20 MEQ PO TBCR
20.0000 meq | EXTENDED_RELEASE_TABLET | Freq: Every day | ORAL | Status: DC
  Administered 2018-10-18 – 2018-10-19 (×2): 20 meq via ORAL
  Filled 2018-10-18 (×2): qty 1

## 2018-10-18 NOTE — H&P (Signed)
H&P        History and Physical    [name] EAV:[id number] DOB: [birth year] DOA: 10/17/2018  PCP: Patient, No Pcp Per   Patient coming from: Home  I have personally briefly reviewed patient's old medical records in [facility]  Chief Complaint: Increasing shortness of breath  HPI: [name] is a 58 y.o. male t with past medical history of CHF, schizophrenia, penile cellulitis, and homelessness, who returns again to the emergency department today with a chief complaint of shortness of breath.  He was approximately 1 week ago at [facility], and was subsequently discharge.  He states that he has nowhere to stay, and states that his shortness of breath has been worsening.  Denies any fevers or chills.  Denies any chest pain, but does feel short of breath, which is worsened with exertion.  States that his legs are swollen.  He also continues to complain of mild discharge around his penis with pain. He also complains of moderate to severe pain at both heels making it difficult to walk or bear weight.  He states that he has lost his antibiotics and several of his other medications. He still does not have a place to stay..  The history is provided by the patient. No language interpreter was used.  ED Course: Initial evaluation in the emergency department revealed the patient to be short of breath.  His x-ray showed mild congestive heart failure.  BNP was markedly elevated at greater than 1000.  He is now admitted for a recurrent exacerbation of his CHF along with an untreated balanitis.  He also has multiple fissures in the heels of his feet are a source of his pain.  Review of Systems:  HEENT -complains of sore teeth sore mouth.  He reports very significant decreased vision.  No odynophagia or dysphagia. Vascular- complains of mild chest discomfort and pressure but no frank chest pain.  Planes of mild peripheral edema Pulmonary- pains of shortness of breath.  Has cough GI-  reports mild abdominal discomfort but denies frank nausea or vomiting.  He denies diarrhea. GU patient denies any dysuria but does complain of pain at the tip of his penis MSK- he has diffuse mild joint discomfort but denies any particular swelling of his joints range of motion. Neuro-patient denies headache, no history of head trauma.  Patient denies any tremor or shaking.  Denies loss of sensation.  He reports no problems with balance   Past Medical History:  Diagnosis Date  . Chronic foot pain   . Cocaine abuse (HCC)   . Diabetes mellitus without complication (HCC)   . Hepatitis C    unsure   . Homelessness   . Hypertension   . Neuropathy   . Polysubstance abuse (HCC)   . Schizophrenia (HCC)   . Sleep apnea   . Systolic and diastolic CHF, chronic (HCC)     Past Surgical History:  Procedure Laterality Date  . MULTIPLE TOOTH EXTRACTIONS       reports that he has been smoking cigarettes. He has a 20.00 pack-year smoking history. He uses smokeless tobacco. He reports current alcohol use. He reports current drug use. Frequency: 7.00 times per week. Drugs: "Crack" cocaine, Cocaine, and Marijuana.  Patient is chronically unemployed and does live off of disability.  No family in the area.  He reports that he remains homeless.  Allergies  Allergen Reactions  . Haldol [Haloperidol] Other (See Comments)    Muscle spasms, loss of  voluntary movement. However, pt has taken Thorazine on multiple occasions with no adverse effects.     Family History  Problem Relation Age of Onset  . Hypertension Other   . Diabetes Other      Prior to Admission medications   Medication Sig Start Date End Date Taking? Authorizing Provider  ARIPiprazole (ABILIFY) 10 MG tablet Take 1 tablet (10 mg total) by mouth daily. 09/18/18  Yes Meredeth Ide, MD  aspirin EC 81 MG tablet Take 1 tablet (81 mg total) by mouth daily for 30 days. 09/18/18 10/18/18 Yes Meredeth Ide, MD  atorvastatin (LIPITOR) 40 MG tablet  Take 1 tablet (40 mg total) by mouth daily. 09/18/18  Yes Meredeth Ide, MD  bisacodyl (DULCOLAX) 5 MG EC tablet Take 1 tablet (5 mg total) by mouth daily as needed for moderate constipation. 09/04/18  Yes Rodolph Bong, MD  carbamazepine (TEGRETOL) 200 MG tablet Take 0.5 tablets (100 mg total) by mouth 2 (two) times daily. 09/18/18  Yes Meredeth Ide, MD  furosemide (LASIX) 40 MG tablet Take 1 tablet (40 mg total) by mouth daily. 09/24/18  Yes Nanavati, Ankit, MD  hydrocortisone (PROCTO-PAK) 1 % CREA Apply twice a day to hemorrhoids. 09/29/18  Yes Renne Crigler, PA-C  Insulin Glargine (LANTUS) 100 UNIT/ML Solostar Pen Inject 14 Units into the skin daily. 09/18/18  Yes Meredeth Ide, MD  Insulin Pen Needle 29G X MISC Use with insulin 09/18/18  Yes Sharl Ma, Sarina Ill, MD  lisinopril (PRINIVIL,ZESTRIL) 10 MG tablet Take 1 tablet (10 mg total) by mouth daily. 09/18/18  Yes Lama, Sarina Ill, MD  nicotine (NICODERM CQ - DOSED IN MG/24 HOURS) 21 mg/24hr patch Place 1 patch (21 mg total) onto the skin daily. 09/04/18  Yes Rodolph Bong, MD  polyethylene glycol [facility] Ethelene Hal) packet Take 17 g by mouth 2 (two) times daily. 09/18/18  Yes Meredeth Ide, MD  Potassium Chloride ER 20 MEQ TBCR Take 20 mEq by mouth daily. 09/18/18  Yes Lama, Sarina Ill, MD  senna-docusate (SENOKOT-S) 8.6-50 MG tablet Take 1 tablet by mouth 2 (two) times daily. 09/04/18  Yes Rodolph Bong, MD  tamsulosin [facility]) 0.4 MG CAPS capsule Take 1 capsule (0.4 mg total) by mouth daily after breakfast. 10/05/18  Yes Narda Bonds, MD  cephALEXin (KEFLEX) 500 MG capsule Take 1 capsule (500 mg total) by mouth 3 (three) times daily for 6 days. 10/17/18 10/23/18  Aviva Kluver B, PA-C  sulfamethoxazole-trimethoprim (BACTRIM DS) 800-160 MG tablet Take 1 tablet by mouth 2 (two) times daily for 6 days. 10/17/18 10/23/18  Elisha Ponder, PA-C    Physical Exam: Vitals:   10/18/18 0103 10/18/18 0151 10/18/18 0304 10/18/18 0330  BP: (!) 160/103 (!)  139/106 (!) 165/107 (!) 155/99  Pulse: 93 92 94 91  Resp: (!) 32  Temp:   98.7 F (37.1 C)   TempSrc:   Oral   SpO2: 96% 96% 94% 93%    Constitutional: NAD, calm, comfortable Vitals:   10/18/18 0103 10/18/18 0151 10/18/18 0304 10/18/18 0330  BP: (!) 160/103 (!) 139/106 (!) 165/107 (!) 155/99  Pulse: 93 92 94 91  Resp: (!) 32  Temp:   98.7 F (37.1 C)   TempSrc:   Oral   SpO2: 96% 96% 94% 93%   General: Patient appears much older than his chronologic age.  Cute distress and is cooperative with the exam frank hallucination.   Eyes:  PERRL, bilateral arcus senilis is present some crusting is noted on lids and conjunctiva appear mildly swollen no frank exudate ENMT: Mucous membranes are moist. Posterior pharynx clear of any exudate or lesions.dentition is very poor with many broken teeth poor hygiene question of gingivitis Neck: normal, supple, no masses, no thyromegaly Respiratory: Unlabored respirations without rales or wheezes noted but no dullness to percussion.  Diaphragmatic excursion is noted..  Cardiovascular: Regular rate and rhythm, no murmurs / rubs / gallops.  1+ distal extremity edema noted.  Trace to 1+ pedal pulses. No carotid bruits.  Abdomen: Protuberant, soft easily reducible umbilical hernia noted no tenderness, no masses palpated. No hepatosplenomegaly. Bowel sounds positive.  Musculoskeletal: no clubbing / cyanosis. No joint deformity upper and lower extremities. Good ROM, no contractures. Normal muscle tone.  Skin: Feet are erythematous, toenails are in poor condition, no intertriginous rash or lesions, no ulcers. No induration.  Fissuring is noted at both heels  neurologic: CN 2-12 grossly intact. . Strength 4/5 in all 4.  Psychiatric: Patient is awake alert answers questions speech is difficult to understand but is intelligible.  Mental status testing not performed. patient does admit to auditory hallucinations that are nondirective.  Patient appears  anxious and mildly agitated.  He has very poor insight into his medical condition    Labs on Admission: I have personally reviewed following labs and imaging studies  CBC: Recent Labs  Lab 10/12/18 0102 10/14/18 0540 10/17/18 2235  WBC 10.0 8.4 7.2  NEUTROABS 8.4*  --  5.5  HGB 9.9* 10.2* 9.6*  HCT 34.2* 35.2* 32.6*  MCV 81.4 82.8 83.8  PLT 313 275 245   Basic Metabolic Panel: Recent Labs  Lab 10/12/18 0102 10/13/18 0508 10/14/18 0540 10/17/18 1556 10/17/18 2235  NA 139 142 138 136 135  K 3.3* 3.1* 3.4* 4.0 3.9  CL 98 102 99 102 101  CO2 GLUCOSE 274* 158* 330* 335* 372*  BUN 23* 19 21* 25* 29*  CREATININE 1.32* 1.13 1.26* 1.24 1.24  CALCIUM 8.8* 8.6* 9.2 8.7* 8.6*   GFR: Estimated Creatinine Clearance: 71 mL/min (by C-G formula based on SCr of 1.24 mg/dL). Liver Function Tests: Recent Labs  Lab 10/12/18 0102  AST 44*  ALT 24  ALKPHOS 113  BILITOT 0.8  PROT 6.6  ALBUMIN 2.8*   No results for input(s): LIPASE, AMYLASE in the last 168 hours. No results for input(s): AMMONIA in the last 168 hours. Coagulation Profile: No results for input(s): INR, PROTIME in the last 168 hours. Cardiac Enzymes: Recent Labs  Lab 10/17/18 2235  TROPONINI 0.03*   BNP (last 3 results) No results for input(s): PROBNP in the last 8760 hours. HbA1C: No results for input(s): HGBA1C in the last 72 hours. CBG: Recent Labs  Lab 10/12/18 2049 10/13/18 0053 10/13/18 0424 10/13/18 0746 10/18/18 0404  GLUCAP 357* 104* 152* 175* 292*   Lipid Profile: No results for input(s): CHOL, HDL, LDLCALC, TRIG, CHOLHDL, LDLDIRECT in the last 72 hours. Thyroid Function Tests: No results for input(s): TSH, T4TOTAL, FREET4, T3FREE, THYROIDAB in the last 72 hours. Anemia Panel: No results for input(s): VITAMINB12, FOLATE, FERRITIN, TIBC, IRON, RETICCTPCT in the last 72 hours. Urine analysis:    Component Value Date/Time   COLORURINE STRAW (A) 10/12/2018 0156    APPEARANCEUR CLEAR 10/12/2018 0156   LABSPEC 1.011 10/12/2018 0156   PHURINE 7.0 10/12/2018 0156   GLUCOSEU 50 (A) 10/12/2018 0156   HGBUR NEGATIVE 10/12/2018 0156   BILIRUBINUR  NEGATIVE 10/12/2018 0156   KETONESUR NEGATIVE 10/12/2018 0156   PROTEINUR 30 (A) 10/12/2018 0156   UROBILINOGEN 1.0 03/14/2015 0610   NITRITE NEGATIVE 10/12/2018 0156   LEUKOCYTESUR NEGATIVE 10/12/2018 0156    Radiological Exams on Admission: Dg Chest Portable 1 View  Result Date: 10/17/2018 CLINICAL DATA:  Shortness of breath. EXAM: PORTABLE CHEST 1 VIEW COMPARISON:  10/09/2018 FINDINGS: The cardio pericardial silhouette is enlarged. There is pulmonary vascular congestion with interstitial pulmonary edema. Chronic atelectasis or linear scar in the left mid lung is similar to prior. The visualized bony structures of the thorax are intact. Insert wire IMPRESSION: Cardiomegaly with vascular congestion and probable interstitial edema. Electronically Signed   By: Kennith Center M.D.   On: 10/17/2018 23:14    EKG: Independently reviewed.  EKG with sinus rhythm no acute changes change from previous study  Assessment/Plan Principal Problem:   Acute on chronic combined systolic (congestive) and diastolic (congestive) heart failure (HCC) Active Problems:   Insulin-requiring or dependent type II diabetes mellitus (HCC)   Essential hypertension, benign   Cocaine use disorder, severe, dependence (HCC)   Schizoaffective disorder, bipolar type (HCC)   Homelessness   Tobacco use   Penile cellulitis   Elevated troponin   HLD (hyperlipidemia)   Anxiety   CHF (congestive heart failure), NYHA class II, acute on chronic, combined (HCC)  (please populate well all problems here in Problem List. (For example, if patient is on BP meds at home and you resume or decide to hold them, it is a problem that needs to be her. Same for CAD, COPD, HLD and so on)   1.  Congestive heart failure patient with a longstanding history of  congeheart failure who presents with increasing shortness of breath.  Admitted recently for this problem but is left the hospital on several occasions AMA he is nonadherent to his medical regimen.  His x-ray reveals mild CHF.  BNP markedly elevated.  Patient with mild peripheral edema.  Last 2D echo September 02, 2018: EF 25 to 30%, severe global hypokinesis LVH valvular disease  plan admit to medical telemetry 24-hour telemetry monitoring secondary to CHF  Resume previous dose of Lasix  Monitor weight and I's and O's  2.  Balanitis- has been nonadherent with his antibiotic regimen.  He used to complain of pain at the tip of his penis.  Wearing a condom catheter to keep dry exacerbates his symptoms.  Minimally elevated white blood count.  But he is afebrile. Plan resume Septra DS twice daily  3.  Diabetes-patient is nonadherent to his medical regimen.  Serum glucose was over 300.  This limits healing of his balanitis.  Last hemoglobin A1c July 18, 2018 was 7.3% Plan resume prior regimen with and is 14 units nightly  Sliding scale with CBG's AC  Low-carb diet  4.  Hypertension- it was supposedly taking an ACE inhibitor and diuretic.  Suspect poor adherence.  She was mildly elevated. Plan new home regimen  5.  Psycho-social- she has schizoaffective disorder with hallucinations as well as being homeless.  The primary cause of his recurrent bouts of illness and return to hospital.  Options are limited. Plan continue home medications  Social work consult to find housing problems  Patient's overall prognosis is grim given his multiple comorbidities and psychosocial dysfunction  DVT prophylaxis: Lucky Cowboy (Lovenox/Heparin/SCD's/anticoagulated/None (if comfort care) Code Status: Full code (Full/Partial (specify details) Family Communication: No family (Specify name, relationship. Do not write "discussed with patient". Specify tel # if discussed  over the phone) Disposition Plan: Attempt to discharge to a  supervised setting (specify when and where you expect patient to be discharged) Consults called: No consults (with names) Admission status: Medical telemetry (inpatient / obs / tele / medical floor / SDU)   Illene RegulusMichael Taleyah Hillman MD [facility] Pager [phone]  If 7PM-7AM, please contact night-coverage [email or web address] Password [email or web address]  10/18/2018, 4:08 AM

## 2018-10-18 NOTE — Progress Notes (Signed)
[facility]  PROGRESS NOTE  Mellody MemosFrederick L Blodgett UEA:[id number] DOB: 07/08/1960 DOA: 10/17/2018 PCP: Patient, No Pcp Per   Patient admitted this morning, see detailed H&P by Dr. Debby BudNorins.  Brief HPI:   58 year old male with history of chronic systolic and diastolic CHF, EF 25 to 30%, Schizophrenia, homelessness came to ED with complaints of shortness of breath.  Patient was started on IV Lasix.  Also has a history of alcohol abuse.    Subjective   This morning patient is somnolent but arousable.  Denies chest pain or shortness of breath.   Assessment/Plan:     1. Acute on chronic systolic and diastolic CHF-patient presented with worsening shortness of breath, BNP elevated to 1095.  Up from 733 on 10/14/2018.  Chest x-ray showed cardiomegaly with vascular congestion and  probable interstitial edema.  Excellent diuresis with Lasix 80 mg IV x1 last night.  We will continue with Lasix at a dose of 60 mg every 12 hours.  Strict intake and output.  Daily weights.  Daily BMP.  2. Diabetes mellitus-patient started on sliding scale insulin with NovoLog.  Last A1c in January was 7.3.  Blood was well controlled in the hospital.  Continue carb modified diet.  3. Hypertension-blood pressure mildly elevated, continue lisinopril.  4. Penile cellulitis-patient was started on Bactrim during previous admission a week ago.  We will continue with Bactrim at this time.  5. History of alcohol abuse-start CIWA protocol.    CBG: Recent Labs  Lab 10/13/18 0424 10/13/18 0746 10/18/18 0404 10/18/18 0742 10/18/18 1134  GLUCAP 152* 175* 292* 260* 176*    CBC: Recent Labs  Lab 10/12/18 0102 10/14/18 0540 10/17/18 2235 10/18/18 0342  WBC 10.0 8.4 7.2 6.7  NEUTROABS 8.4*  --  5.5  --   HGB 9.9* 10.2* 9.6* 9.5*  HCT 34.2* 35.2* 32.6* 32.7*  MCV 81.4 82.8 83.8 83.0  PLT 313 275 245 249    Basic Metabolic Panel: Recent Labs  Lab 10/12/18 0102 10/13/18 0508 10/14/18 0540 10/17/18 1556  10/17/18 2235 10/18/18 0342 10/18/18 1025  NA 139 142 138 136 135  --   --   K 3.3* 3.1* 3.4* 4.0 3.9  --   --   CL 98 102 99 102 101  --   --   CO2 31 30 29 23 27   --   --   GLUCOSE 274* 158* 330* 335* 372*  --   --   BUN 23* 19 21* 25* 29*  --   --   CREATININE 1.32* 1.13 1.26* 1.24 1.24 1.18  --   CALCIUM 8.8* 8.6* 9.2 8.7* 8.6*  --   --   MG  --   --   --   --   --   --  2.2     DVT prophylaxis: Lovenox  Code Status: Full code  Family Communication: No family at bedside  Disposition Plan: likely home when medically ready for discharge     Consultants:    Procedures:     Antibiotics:   Anti-infectives (From admission, onward)   Start     Dose/Rate Route Frequency Ordered Stop   10/18/18 1000  sulfamethoxazole-trimethoprim (BACTRIM DS) 800-160 MG per tablet 1 tablet     1 tablet Oral 2 times daily 10/18/18 0318         Objective   Vitals:   10/18/18 0330 10/18/18 0400 10/18/18 0540 10/18/18 1315  BP: (!) 155/99 (!) 164/102  (!) 143/90  Pulse: 91 94  96  Resp: (!) 32 12  18  Temp:    97.8 F (36.6 C)  TempSrc:    Oral  SpO2: 93% 95%  95%  Weight:   89.7 kg   Height:    (1.753 m)     Intake/Output Summary (Last 24 hours) at 10/18/2018 1350 Last data filed at 10/18/2018 1323 Gross per 24 hour  Intake 720 ml  Output 3500 ml  Net -2780 ml   Filed Weights   10/18/18 0540  Weight: 89.7 kg     Physical Examination:    General: Appears in no acute distress, somnolent but arousable  Cardiovascular: S1-S2, regular, no murmur auscultated.  Positive JVD  Respiratory: Decreased breath sounds bilaterally  Abdomen: Abdomen is soft, nontender, no organomegaly  Extremities: Bilateral1+ pitting edema in lower extremities  Neurologic: Somnolent but arousable, moving all extremities.     Data Reviewed: I have personally reviewed following labs and imaging studies   No results found for this or any previous visit (from the past 240  hour(s)).   Liver Function Tests: Recent Labs  Lab 10/12/18 0102  AST 44*  ALT 24  ALKPHOS 113  BILITOT 0.8  PROT 6.6  ALBUMIN 2.8*   No results for input(s): LIPASE, AMYLASE in the last 168 hours. No results for input(s): AMMONIA in the last 168 hours.  Cardiac Enzymes: Recent Labs  Lab 10/17/18 2235  TROPONINI 0.03*   BNP (last 3 results) Recent Labs    10/12/18 0102 10/14/18 0540 10/17/18 2235  BNP 1,289.1* 733.0* 1,095.1*    ProBNP (last 3 results) No results for input(s): PROBNP in the last 8760 hours.    Studies: Dg Chest Portable 1 View  Result Date: 10/17/2018 CLINICAL DATA:  Shortness of breath. EXAM: PORTABLE CHEST 1 VIEW COMPARISON:  10/09/2018 FINDINGS: The cardio pericardial silhouette is enlarged. There is pulmonary vascular congestion with interstitial pulmonary edema. Chronic atelectasis or linear scar in the left mid lung is similar to prior. The visualized bony structures of the thorax are intact. Insert wire IMPRESSION: Cardiomegaly with vascular congestion and probable interstitial edema. Electronically Signed   By: Kennith Center M.D.   On: 10/17/2018 23:14    Scheduled Meds: . ARIPiprazole  10 mg Oral Daily  . aspirin EC  81 mg Oral Daily  . atorvastatin  40 mg Oral q1800  . carbamazepine  100 mg Oral BID  . enoxaparin (LOVENOX) injection  40 mg Subcutaneous Daily  . folic acid  1 mg Oral Daily  . furosemide  40 mg Intravenous Q12H  . hydrocortisone   Topical Daily  . insulin aspart  0-15 Units Subcutaneous TID WC  . insulin glargine  14 Units Subcutaneous Daily  . lisinopril  10 mg Oral Daily  . multivitamin with minerals  1 tablet Oral Daily  . nicotine  21 mg Transdermal Daily  . potassium chloride SA  20 mEq Oral Daily  . senna-docusate  1 tablet Oral BID  . sodium chloride flush  3 mL Intravenous Q12H  . sodium chloride flush  3 mL Intravenous Q12H  . sulfamethoxazole-trimethoprim  1 tablet Oral BID  . tamsulosin  0.4 mg Oral QPC  breakfast  . thiamine  100 mg Oral Daily   Or  . thiamine  100 mg Intravenous Daily    Admission status: Inpatient: Based on patients clinical presentation and evaluation of above clinical data, I have made determination that patient meets Inpatient criteria at this time.  Time spent: 20 min  Meredeth Ide   [facility] Pager [phone]. If 7PM-7AM, please contact night-coverage at [email or web address], Office  [phone]  password [email or web address]  10/18/2018, 1:50 PM  LOS: 0 days

## 2018-10-18 NOTE — Progress Notes (Signed)
Pt. Requesting milk and graham crackers. Primary RN caring for pt attempted to educate on the importance on having CBG checked prior to receiving snack. Pt. Became very irate and yelling at RN stating, "I'm tired of y'all white b-----s. I want to leave." Pt. Shaking the bed and attempted to spit at staff. Security called, Adam Phenix made aware, On call NP Bodenheimer paged and made aware of situation. When [facility] and security presented on the floor, pt. Changed his mind and decided he wanted to stay. Will continue to monitor pt. Closely.

## 2018-10-18 NOTE — ED Notes (Signed)
ED TO INPATIENT HANDOFF REPORT  ED Nurse Name and Phone #:  Lanae Boast Name/Age/Gender [name] 58 y.o. male Room/Bed: WA22/WA22  Code Status   Code Status: Full Code  Home/SNF/Other Home Patient oriented to: self, place, time and situation Is this baseline? Yes   Triage Complete: Triage complete  Chief Complaint Hyperglycemia  Triage Note Pt comes to ed via ems, homeless [facility], multiple complaints.  Hyperglycemia, foot pains, penile discomfort and rectal pains verbalized by ems. V/s on arrival 152/pal pluses 72, rr18, cgb 416.  Hx of diabetes, HTN, CHF, Psych and substance abuse. Care plan in place.  Alert x4, walked.    Allergies Allergies  Allergen Reactions  . Haldol [Haloperidol] Other (See Comments)    Muscle spasms, loss of voluntary movement. However, pt has taken Thorazine on multiple occasions with no adverse effects.     Level of Care/Admitting Diagnosis ED Disposition    ED Disposition Condition Comment   Admit  Hospital Area: [facility] [[id number]]  Level of Care: Telemetry [5]  Admit to tele based on following criteria: Acute CHF  Covid Evaluation: N/A  Diagnosis: CHF (congestive heart failure), NYHA class II, acute on chronic, combined [facility]) [[id number]]  Admitting Physician: Jacques Navy [[id number]]  Attending Physician: Illene Regulus E [[id number]]  Estimated length of stay: 3 - 4 days  Certification:: I certify this patient will need inpatient services for at least 2 midnights  PT Class (Do Not Modify): Inpatient [101]  PT Acc Code (Do Not Modify): Private [1]       B Medical/Surgery History Past Medical History:  Diagnosis Date  . Chronic foot pain   . Cocaine abuse (HCC)   . Diabetes mellitus without complication (HCC)   . Hepatitis C    unsure   . Homelessness   . Hypertension   . Neuropathy   . Polysubstance abuse (HCC)   . Schizophrenia (HCC)   . Sleep apnea   . Systolic  and diastolic CHF, chronic (HCC)    Past Surgical History:  Procedure Laterality Date  . MULTIPLE TOOTH EXTRACTIONS       A IV Location/Drains/Wounds Patient Lines/Drains/Airways Status   Active Line/Drains/Airways    Name:   Placement date:   Placement time:   Site:   Days:   Peripheral IV 10/14/18 Right Antecubital   10/14/18    0543    Antecubital   4   Peripheral IV 10/17/18 Left Antecubital   10/17/18    2333    Antecubital   1   Wound / Incision (Open or Dehisced) 10/12/18 Penis   10/12/18    0430    Penis   6          Intake/Output Last 24 hours No intake or output data in the 24 hours ending 10/18/18 0449  Labs/Imaging Results for orders placed or performed during the hospital encounter of 10/17/18 (from the past 48 hour(s))  Brain natriuretic peptide     Status: Abnormal   Collection Time: 10/17/18 10:35 PM  Result Value Ref Range   B Natriuretic Peptide 1,095.1 (H) 0.0 - 100.0 pg/mL    Comment: Performed at [facility], [address], [place], [place] [address]  Basic metabolic panel     Status: Abnormal   Collection Time: 10/17/18 10:35 PM  Result Value Ref Range   Sodium 135 135 - 145 mmol/L   Potassium 3.9 3.5 - 5.1  mmol/L   Chloride 101 98 - 111 mmol/L   CO2 27 22 - 32 mmol/L   Glucose, Bld 372 (H) 70 - 99 mg/dL   BUN 29 (H) 6 - 20 mg/dL   Creatinine, Ser 1.61 0.61 - 1.24 mg/dL   Calcium 8.6 (L) 8.9 - 10.3 mg/dL   GFR calc non Af Amer NOT CALCULATED >60 mL/min   GFR calc Af Amer NOT CALCULATED >60 mL/min   Anion gap 7.0 5 - 15    Comment: Performed at [facility], [address] [place], [place] [address]  CBC with Differential/Platelet     Status: Abnormal   Collection Time: 10/17/18 10:35 PM  Result Value Ref Range   WBC 7.2 4.0 - 10.5 K/uL   RBC 3.89 (L) 4.22 - 5.81 MIL/uL   Hemoglobin 9.6 (L) 13.0 - 17.0 g/dL   HCT 54.0 (L) 98.1 - 19.1 %   MCV 83.8 80.0 - 100.0 fL   MCH 24.7 (L) 26.0 - 34.0 pg   MCHC  29.4 (L) 30.0 - 36.0 g/dL   RDW 47.8 (H) 29.5 - 62.1 %   Platelets 245 150 - 400 K/uL   nRBC 0.0 0.0 - 0.2 %   Neutrophils Relative % 76 %   Neutro Abs 5.5 1.7 - 7.7 K/uL   Lymphocytes Relative 11 %   Lymphs Abs 0.8 0.7 - 4.0 K/uL   Monocytes Relative 9 %   Monocytes Absolute 0.6 0.1 - 1.0 K/uL   Eosinophils Relative 3 %   Eosinophils Absolute 0.2 0.0 - 0.5 K/uL   Basophils Relative 1 %   Basophils Absolute 0.0 0.0 - 0.1 K/uL   WBC Morphology DOHLE BODIES     Comment: MILD LEFT SHIFT (1-5% METAS, OCC MYELO, OCC BANDS) TOXIC GRANULATION    Immature Granulocytes 0 %   Abs Immature Granulocytes 0.01 0.00 - 0.07 K/uL    Comment: Performed at [facility], [address], [place], [place] [address]  Troponin I - ONCE - STAT     Status: Abnormal   Collection Time: 10/17/18 10:35 PM  Result Value Ref Range   Troponin I 0.03 (HH) <0.03 ng/mL    Comment: CRITICAL RESULT CALLED TO, READ BACK BY AND VERIFIED WITH: TALKINGTON,J RN  ON 10/18/2018 JACKSON,K Performed at [facility], [address], [place], [place] [address]   CBG monitoring, ED     Status: Abnormal   Collection Time: 10/18/18  4:04 AM  Result Value Ref Range   Glucose-Capillary 292 (H) 70 - 99 mg/dL   Dg Chest Portable 1 View  Result Date: 10/17/2018 CLINICAL DATA:  Shortness of breath. EXAM: PORTABLE CHEST 1 VIEW COMPARISON:  10/09/2018 FINDINGS: The cardio pericardial silhouette is enlarged. There is pulmonary vascular congestion with interstitial pulmonary edema. Chronic atelectasis or linear scar in the left mid lung is similar to prior. The visualized bony structures of the thorax are intact. Insert wire IMPRESSION: Cardiomegaly with vascular congestion and probable interstitial edema. Electronically Signed   By: Kennith Center M.D.   On: 10/17/2018 23:14    Pending Labs Unresulted Labs (From admission, onward)    Start     Ordered   10/25/18 0500  Creatinine, serum   (enoxaparin (LOVENOX)    CrCl >/= 30 ml/min)  Weekly,   R    Comments:  while on enoxaparin therapy    10/18/18 0358   10/18/18 0653  Hemoglobin A1c  Once,   R  Comments:  To assess prior glycemic control    10/18/18 0358   10/18/18 0342  CBC  (enoxaparin (LOVENOX)    CrCl >/= 30 ml/min)  Once,   R    Comments:  Baseline for enoxaparin therapy IF NOT ALREADY DRAWN.  Notify MD if PLT < 100 K.    10/18/18 0358   10/18/18 0342  Creatinine, serum  (enoxaparin (LOVENOX)    CrCl >/= 30 ml/min)  Once,   R    Comments:  Baseline for enoxaparin therapy IF NOT ALREADY DRAWN.    10/18/18 0358          Vitals/Pain Today's Vitals   10/18/18 0304 10/18/18 0330 10/18/18 0354 10/18/18 0400  BP: (!) 165/107 (!) 155/99  (!) 164/102  Pulse: 94 91  94  Resp: 17 (!) 32  12  Temp: 98.7 F (37.1 C)     TempSrc: Oral     SpO2: 94% 93%  95%  PainSc: Asleep  Asleep     Isolation Precautions No active isolations  Medications Medications  aspirin EC tablet 81 mg (has no administration in time range)  sulfamethoxazole-trimethoprim (BACTRIM DS) 800-160 MG per tablet 1 tablet (has no administration in time range)  atorvastatin (LIPITOR) tablet 40 mg (has no administration in time range)  furosemide (LASIX) tablet 40 mg (has no administration in time range)  lisinopril (ZESTRIL) tablet 10 mg (has no administration in time range)  ARIPiprazole (ABILIFY) tablet 10 mg (has no administration in time range)  nicotine (NICODERM CQ - dosed in mg/24 hours) patch 21 mg (has no administration in time range)  Insulin Glargine (LANTUS) Solostar Pen 14 Units (has no administration in time range)  bisacodyl (DULCOLAX) EC tablet 5 mg (has no administration in time range)  senna-docusate (Senokot-S) tablet 1 tablet (has no administration in time range)  tamsulosin (FLOMAX) capsule 0.4 mg (has no administration in time range)  Insulin Pen Needle MISC 14 Units (has no administration in time range)  carbamazepine  (TEGRETOL) tablet 100 mg (has no administration in time range)  Potassium Chloride ER TBCR 20 mEq (has no administration in time range)  hydrocortisone (PROCTOCORT) 1 % rectal cream (has no administration in time range)  enoxaparin (LOVENOX) injection 40 mg (has no administration in time range)  sodium chloride flush (NS) 0.9 % injection 3 mL (has no administration in time range)  sodium chloride flush (NS) 0.9 % injection 3 mL (has no administration in time range)  sodium chloride flush (NS) 0.9 % injection 3 mL (has no administration in time range)  0.9 %  sodium chloride infusion (has no administration in time range)  acetaminophen (TYLENOL) tablet 650 mg (has no administration in time range)    Or  acetaminophen (TYLENOL) suppository 650 mg (has no administration in time range)  traMADol (ULTRAM) tablet 50 mg (has no administration in time range)  promethazine (PHENERGAN) tablet 12.5 mg (has no administration in time range)  multivitamin with minerals tablet 1 tablet (has no administration in time range)  insulin aspart (novoLOG) injection 0-15 Units (has no administration in time range)  acetaminophen (TYLENOL) tablet 650 mg (650 mg Oral Given 10/18/18 0302)  furosemide (LASIX) injection 80 mg (80 mg Intravenous Given 10/18/18 0303)    Mobility walks Low fall risk   Focused Assessments Cardiac Assessment Handoff:    Lab Results  Component Value Date   CKTOTAL 245 01/27/2015   CKMB 1.4 06/01/2008   TROPONINI 0.03 (HH) 10/17/2018   Lab Results  Component Value  Date   DDIMER  05/31/2008    0.36        AT THE INHOUSE ESTABLISHED CUTOFF VALUE OF 0.48 ug/mL FEU, THIS ASSAY HAS BEEN DOCUMENTED IN THE LITERATURE TO HAVE   Does the Patient currently have chest pain? No     R Recommendations: See Admitting Provider Note  Report given to:   Additional Notes:

## 2018-10-19 ENCOUNTER — Encounter (HOSPITAL_COMMUNITY): Payer: Self-pay

## 2018-10-19 DIAGNOSIS — F1410 Cocaine abuse, uncomplicated: Secondary | ICD-10-CM

## 2018-10-19 DIAGNOSIS — F209 Schizophrenia, unspecified: Secondary | ICD-10-CM

## 2018-10-19 DIAGNOSIS — Z590 Homelessness: Secondary | ICD-10-CM

## 2018-10-19 DIAGNOSIS — E119 Type 2 diabetes mellitus without complications: Secondary | ICD-10-CM

## 2018-10-19 DIAGNOSIS — F250 Schizoaffective disorder, bipolar type: Secondary | ICD-10-CM

## 2018-10-19 DIAGNOSIS — F17210 Nicotine dependence, cigarettes, uncomplicated: Secondary | ICD-10-CM

## 2018-10-19 DIAGNOSIS — I110 Hypertensive heart disease with heart failure: Principal | ICD-10-CM

## 2018-10-19 DIAGNOSIS — I472 Ventricular tachycardia: Secondary | ICD-10-CM

## 2018-10-19 DIAGNOSIS — I10 Essential (primary) hypertension: Secondary | ICD-10-CM

## 2018-10-19 DIAGNOSIS — Z9114 Patient's other noncompliance with medication regimen: Secondary | ICD-10-CM

## 2018-10-19 DIAGNOSIS — F1420 Cocaine dependence, uncomplicated: Secondary | ICD-10-CM

## 2018-10-19 DIAGNOSIS — Z888 Allergy status to other drugs, medicaments and biological substances status: Secondary | ICD-10-CM

## 2018-10-19 DIAGNOSIS — F1010 Alcohol abuse, uncomplicated: Secondary | ICD-10-CM

## 2018-10-19 DIAGNOSIS — N4822 Cellulitis of corpus cavernosum and penis: Secondary | ICD-10-CM

## 2018-10-19 LAB — BASIC METABOLIC PANEL
Anion gap: 7 (ref 5–15)
BUN: 27 mg/dL — ABNORMAL HIGH (ref 6–20)
CO2: 26 mmol/L (ref 22–32)
Calcium: 8.7 mg/dL — ABNORMAL LOW (ref 8.9–10.3)
Chloride: 107 mmol/L (ref 98–111)
Creatinine, Ser: 1.18 mg/dL (ref 0.61–1.24)
GFR calc Af Amer: >60 mL/min (ref >60)
GFR calc non Af Amer: >60 mL/min (ref >60)
Glucose, Bld: 143 mg/dL — ABNORMAL HIGH (ref 70–99)
Potassium: 3.5 mmol/L (ref 3.5–5.1)
Sodium: 140 mmol/L (ref 135–145)

## 2018-10-19 LAB — GLUCOSE, CAPILLARY
Glucose-Capillary: 122 mg/dL — ABNORMAL HIGH (ref 70–99)
Glucose-Capillary: 135 mg/dL — ABNORMAL HIGH (ref 70–99)
Glucose-Capillary: 283 mg/dL — ABNORMAL HIGH (ref 70–99)

## 2018-10-19 MED ORDER — CARVEDILOL 6.25 MG PO TABS: 6.2500 mg | ORAL_TABLET | Freq: Two times a day (BID) | ORAL | Status: DC

## 2018-10-19 MED ORDER — CARVEDILOL 6.25 MG PO TABS: 6.2500 mg | ORAL_TABLET | Freq: Two times a day (BID) | ORAL | 2 refills | Status: DC

## 2018-10-19 MED ORDER — ASPIRIN EC 81 MG PO TBEC: 81.0000 mg | DELAYED_RELEASE_TABLET | Freq: Every day | ORAL | 3 refills | Status: DC

## 2018-10-19 MED ORDER — POTASSIUM CHLORIDE CRYS ER 20 MEQ PO TBCR
20.0000 meq | EXTENDED_RELEASE_TABLET | Freq: Once | ORAL | Status: AC
  Administered 2018-10-19: 20 meq via ORAL
  Filled 2018-10-19: qty 1

## 2018-10-19 NOTE — Consult Note (Addendum)
Cardiology Consultation:   Due to the COVID-19 pandemic, this visit was completed with telemedicine (audio/video) technology to reduce patient and provider exposure as well as to preserve personal protective equipment.   Patient ID: [name] MRN: [id number]; DOB: Jun 19, 1961  Admit date: 10/17/2018 Date of Consult: 10/19/2018  Primary Care Provider: Patient, No Pcp Per Primary Cardiologist: Kristeen Miss, MD  Primary Electrophysiologist:  None    Patient Profile:   [name] is a 58 y.o. male with a hx of polysubstance abuse, homelessness, chronic systolic and diastolic congestive heart failure, diabetes mellitus who is being seen today for the evaluation of  Acute on chronic CHF  at the request of  Dr. Sharl Ma.  History of Present Illness:   [name] is a 58 year old gentleman who is homeless.  He has a history of homelessness And schizophrenia.  He visits the emergency room frequently.  He is been in the ER at least 35 times since early February.  He has a history of medical noncompliance.  He was just discharged from the hospital on April 14.  He has worked worsening shortness of breath since that time.  He is had leg edema.  He has had penile cellulitis.  He has a history of cigarette smoking.  He has frequent alcohol abuse.  He uses cocaine and marijuana.  He has a hx of he has a history of schizophrenia.  He was cussing at the nurses this morning.  He also tried to spit on them.   This consultation is being done by chart review and discussion with the primary medical team.    Past Medical History:  Diagnosis Date  . Chronic foot pain   . Cocaine abuse (HCC)   . Diabetes mellitus without complication (HCC)   . Hepatitis C    unsure   . Homelessness   . Hypertension   . Neuropathy   . Polysubstance abuse (HCC)   . Schizophrenia (HCC)   . Sleep apnea   . Systolic and diastolic CHF, chronic (HCC)     Past Surgical History:  Procedure Laterality  Date  . MULTIPLE TOOTH EXTRACTIONS       Home Medications:  Prior to Admission medications   Medication Sig Start Date End Date Taking? Authorizing Provider  ARIPiprazole (ABILIFY) 10 MG tablet Take 1 tablet (10 mg total) by mouth daily. 09/18/18  Yes Meredeth Ide, MD  atorvastatin (LIPITOR) 40 MG tablet Take 1 tablet (40 mg total) by mouth daily. 09/18/18  Yes Meredeth Ide, MD  bisacodyl (DULCOLAX) 5 MG EC tablet Take 1 tablet (5 mg total) by mouth daily as needed for moderate constipation. 09/04/18  Yes Rodolph Bong, MD  carbamazepine (TEGRETOL) 200 MG tablet Take 0.5 tablets (100 mg total) by mouth 2 (two) times daily. 09/18/18  Yes Meredeth Ide, MD  furosemide (LASIX) 40 MG tablet Take 1 tablet (40 mg total) by mouth daily. 09/24/18  Yes Nanavati, Ankit, MD  hydrocortisone (PROCTO-PAK) 1 % CREA Apply twice a day to hemorrhoids. 09/29/18  Yes Renne Crigler, PA-C  Insulin Glargine (LANTUS) 100 UNIT/ML Solostar Pen Inject 14 Units into the skin daily. 09/18/18  Yes Meredeth Ide, MD  Insulin Pen Needle 29G X MISC Use with insulin 09/18/18  Yes Sharl Ma, Sarina Ill, MD  lisinopril (PRINIVIL,ZESTRIL) 10 MG tablet Take 1 tablet (10 mg total) by mouth daily. 09/18/18  Yes Meredeth Ide, MD  nicotine (NICODERM CQ - DOSED IN MG/24 HOURS) 21 mg/24hr patch  Place 1 patch (21 mg total) onto the skin daily. 09/04/18  Yes Rodolph Bonghompson, Daniel V, MD  polyethylene glycol [facility](MIRALAX / Ethelene HalGLYCOLAX) packet Take 17 g by mouth 2 (two) times daily. 09/18/18  Yes Meredeth IdeLama, Gagan S, MD  Potassium Chloride ER 20 MEQ TBCR Take 20 mEq by mouth daily. 09/18/18  Yes Lama, Sarina IllGagan S, MD  senna-docusate (SENOKOT-S) 8.6-50 MG tablet Take 1 tablet by mouth 2 (two) times daily. 09/04/18  Yes Rodolph Bonghompson, Daniel V, MD  tamsulosin [facility](FLOMAX) 0.4 MG CAPS capsule Take 1 capsule (0.4 mg total) by mouth daily after breakfast. 10/05/18  Yes Narda BondsNettey, Ralph A, MD  cephALEXin (KEFLEX) 500 MG capsule Take 1 capsule (500 mg total) by mouth 3 (three) times daily for 6  days. 10/17/18 10/23/18  Aviva KluverMurray, Alyssa B, PA-C  sulfamethoxazole-trimethoprim (BACTRIM DS) 800-160 MG tablet Take 1 tablet by mouth 2 (two) times daily for 6 days. 10/17/18 10/23/18  Elisha PonderMurray, Alyssa B, PA-C    Inpatient Medications: Scheduled Meds: . ARIPiprazole  10 mg Oral Daily  . aspirin EC  81 mg Oral Daily  . atorvastatin  40 mg Oral q1800  . carbamazepine  100 mg Oral BID  . enoxaparin (LOVENOX) injection  40 mg Subcutaneous Daily  . folic acid  1 mg Oral Daily  . furosemide  60 mg Intravenous Q12H  . hydrocortisone   Topical Daily  . insulin aspart  0-15 Units Subcutaneous TID WC  . insulin glargine  14 Units Subcutaneous Daily  . lisinopril  10 mg Oral Daily  . multivitamin with minerals  1 tablet Oral Daily  . nicotine  21 mg Transdermal Daily  . potassium chloride SA  20 mEq Oral Daily  . potassium chloride  20 mEq Oral Once  . senna-docusate  1 tablet Oral BID  . sodium chloride flush  3 mL Intravenous Q12H  . sodium chloride flush  3 mL Intravenous Q12H  . sulfamethoxazole-trimethoprim  1 tablet Oral BID  . tamsulosin  0.4 mg Oral QPC breakfast  . thiamine  100 mg Oral Daily   Or  . thiamine  100 mg Intravenous Daily   Continuous Infusions: . sodium chloride     PRN Meds: sodium chloride, acetaminophen **OR** acetaminophen, bisacodyl, LORazepam **OR** LORazepam, promethazine, sodium chloride flush, traMADol  Allergies:    Allergies  Allergen Reactions  . Haldol [Haloperidol] Other (See Comments)    Muscle spasms, loss of voluntary movement. However, pt has taken Thorazine on multiple occasions with no adverse effects.     Social History:   Social History   Socioeconomic History  . Marital status: Single    Spouse name: Not on file  . Number of children: Not on file  . Years of education: Not on file  . Highest education level: Not on file  Occupational History  . Not on file  Social Needs  . Financial resource strain: Not on file  . Food insecurity:     Worry: Not on file    Inability: Not on file  . Transportation needs:    Medical: Not on file    Non-medical: Not on file  Tobacco Use  . Smoking status: Current Every Day Smoker    Packs/day: 1.00    Years: 20.00    Pack years: 20.00    Types: Cigarettes  . Smokeless tobacco: Current User  Substance and Sexual Activity  . Alcohol use: Yes    Comment: Daily Drinker   . Drug use: Yes    Frequency: 7.0 times  per week    Types: "Crack" cocaine, Cocaine, Marijuana  . Sexual activity: Not Currently  Lifestyle  . Physical activity:    Days per week: Not on file    Minutes per session: Not on file  . Stress: Not on file  Relationships  . Social connections:    Talks on phone: Not on file    Gets together: Not on file    Attends religious service: Not on file    Active member of club or organization: Not on file    Attends meetings of clubs or organizations: Not on file    Relationship status: Not on file  . Intimate partner violence:    Fear of current or ex partner: Not on file    Emotionally abused: Not on file    Physically abused: Not on file    Forced sexual activity: Not on file  Other Topics Concern  . Not on file  Social History Narrative   ** Merged History Encounter **        Family History:    Family History  Problem Relation Age of Onset  . Hypertension Other   . Diabetes Other      ROS:  Please see the history of present illness.   All other ROS reviewed and negative.     Physical Exam/Data:   Vitals:   10/18/18 1315 10/18/18 2225 10/19/18 0546 10/19/18 0547  BP: (!) 143/90 (!) 152/93  (!) 147/93  Pulse: 96 89  93  Resp: 18 18  20   Temp: 97.8 F (36.6 C) 98.4 F (36.9 C)  98.3 F (36.8 C)  TempSrc: Oral Oral  Oral  SpO2: 95% 95%  93%  Weight:   96.4 kg   Height:        Intake/Output Summary (Last 24 hours) at 10/19/2018 1118 Last data filed at 10/19/2018 1000 Gross per 24 hour  Intake 2080 ml  Output 7800 ml  Net -5720 ml   Last 3  Weights 10/19/2018 10/18/2018 10/14/2018  Weight (lbs) 212 lb 8.4 oz 197 lb 12 oz 195 lb 1.7 oz  Weight (kg) 96.4 kg 89.7 kg 88.5 kg  Some encounter information is confidential and restricted. Go to Review Flowsheets activity to see all data.     Body mass index is 31.38 kg/m.   No exam perfomed.   EKG:  The EKG was personally reviewed and demonstrates:  NSR , poor R wave progression.   TWI I the lateral leads.  No changes from previous   Telemetry:  Telemetry was personally reviewed and demonstrates:  Sinus tach  Episodes of NS VT .   Relevant CV Studies: Echo from September 02, 2018;   Severe LV dysfunction.  Ejection fraction is 25 to 30%.  There is evidence of diastolic dysfunction.  Moderate to severe tricuspid regurgitation.  Estimated PA pressure 41 mmHg.  Laboratory Data:  Chemistry Recent Labs  Lab 10/17/18 1556 10/17/18 2235 10/18/18 0342 10/19/18 0418  NA 136 135  --  140  K 4.0 3.9  --  3.5  CL 102 101  --  107  CO2 23 27  --  26  GLUCOSE 335* 372*  --  143*  BUN 25* 29*  --  27*  CREATININE 1.24 1.24 1.18 1.18  CALCIUM 8.7* 8.6*  --  8.7*  GFRNONAA >60 NOT CALCULATED >60 >60  GFRAA >60 NOT CALCULATED >60 >60  ANIONGAP 11 7.0  --  7    No results for input(s):  PROT, ALBUMIN, AST, ALT, ALKPHOS, BILITOT in the last 168 hours. Hematology Recent Labs  Lab 10/14/18 0540 10/17/18 2235 10/18/18 0342  WBC 8.4 7.2 6.7  RBC 4.25 3.89* 3.94*  HGB 10.2* 9.6* 9.5*  HCT 35.2* 32.6* 32.7*  MCV 82.8 83.8 83.0  MCH 24.0* 24.7* 24.1*  MCHC 29.0* 29.4* 29.1*  RDW 17.9* 18.4* 18.2*  PLT 275 245 249   Cardiac Enzymes Recent Labs  Lab 10/17/18 2235  TROPONINI 0.03*   No results for input(s): TROPIPOC in the last 168 hours.  BNP Recent Labs  Lab 10/14/18 0540 10/17/18 2235  BNP 733.0* 1,095.1*    DDimer No results for input(s): DDIMER in the last 168 hours.  Radiology/Studies:  Dg Chest Portable 1 View  Result Date: 10/17/2018 CLINICAL DATA:  Shortness of  breath. EXAM: PORTABLE CHEST 1 VIEW COMPARISON:  10/09/2018 FINDINGS: The cardio pericardial silhouette is enlarged. There is pulmonary vascular congestion with interstitial pulmonary edema. Chronic atelectasis or linear scar in the left mid lung is similar to prior. The visualized bony structures of the thorax are intact. Insert wire IMPRESSION: Cardiomegaly with vascular congestion and probable interstitial edema. Electronically Signed   By: Kennith Center M.D.   On: 10/17/2018 23:14    @   Assessment and Plan:   1. Acute on chronic systolic and diastolic congestive heart failure: Mr. Kushnir is admitted again with acute heart failure.  He has a longstanding history of congestive heart failure with a documented ejection fraction of 25 to 30%.  He is completely noncompliant with his medications.  He fortunately, he is homeless and has a difficult time in getting medications.  He is getting Lasix 60 mg every 12 hours.  He is also on lisinopril 10 mg a day, potassium chloride 20 mEq a day On this regimen, he is already diuresed 6.5 L.  I think that his largest issue is due to social and psychiatric problems.  He does not take his medications.  He is homeless and does not have a stable social situation.  He is in the emergency room every 3 to 4 days and has had multiple emergency room visits and admissions past several months.  2.  Nonsustained ventricular tachycardia.  The patient has had several brief episodes of nonsustained ventricular tachycardia.  These were asymptomatic.  These are in the setting of severely depressed left ventricular systolic function.  He also is actively using cocaine and actively abusing alcohol.  His potassium has been replaced.  Magnesium level is normal.  We will add carvedilol 6.25 mg twice a day.  Unfortunately, I do not think that he will take any of his medications.  3.  Social issues: The patient is homeless and is schizophrenic.  He has history  of polysubstance abuse and history of medical noncompliance.    Unfortunately, I do not think that his cardiac condition will improve as long as he is in the same social situation.    4.  Essential HTN:   Will add coreg 6.25 BID to medical regimin  5.  Homelessness  6.  Polysubstance abuse:  Currently abusing alcohol and cocaine.    Further plans per IM   I discussed the situation with Dr. Sharl Ma.   He will DC the patient today on his home meds + Coreg 6.25 PO BID .  Time spent on chart review, review of labs, x rays, VS, input/ output:  35 minutes.   [facility] will sign off.   Medication Recommendations:  Continue  prescribed meds  Other recommendations (labs, testing, etc):   Follow up as an outpatient:  With a primary MD   For questions or updates, please contact [facility] Please consult [email or web address] for contact info under     Signed, Kristeen Miss, MD  10/19/2018 11:18 AM

## 2018-10-19 NOTE — TOC Initial Note (Signed)
Transition of Care [facility](TOC) - Initial/Assessment Note    Patient Details  Name: [name] MRN: [id number] Date of Birth: Jun 23, 1961  Transition of Care [facility](TOC) CM/SW Contact:    Lanier ClamMahabir, Devyn Griffing, RN Phone Number: 10/19/2018, 2:26 PM  Clinical Narrative:  Dario GuardianC IRC-spoke to [name] [name]-NP @ [facility] for med asst,medical care, & resources-their pharmacy closes @ 3p-patient has a pharmacy listed already, listing of shelters provided. Buses are free. No further CM needs.                       Patient Goals and CMS Choice        Expected Discharge Plan and Services           Expected Discharge Date: 10/19/18                        Prior Living Arrangements/Services                       Activities of Daily Living Home Assistive Devices/Equipment: Gilmer MorCane (specify quad or straight) ADL Screening (condition at time of admission) Patient's cognitive ability adequate to safely complete daily activities?: Yes Is the patient deaf or have difficulty hearing?: No Does the patient have difficulty seeing, even when wearing glasses/contacts?: No Does the patient have difficulty concentrating, remembering, or making decisions?: No Patient able to express need for assistance with ADLs?: Yes Does the patient have difficulty dressing or bathing?: No Independently performs ADLs?: Yes (appropriate for developmental age) Does the patient have difficulty walking or climbing stairs?: Yes Weakness of Legs: Both Weakness of Arms/Hands: None  Permission Sought/Granted                  Emotional Assessment              Admission diagnosis:  Acute on chronic congestive heart failure, unspecified heart failure type [facility](HCC) [I50.9] Patient Active Problem List   Diagnosis Date Noted  . Elevated troponin 10/18/2018  . HLD (hyperlipidemia) 10/18/2018  . Anxiety 10/18/2018  . CHF (congestive heart failure), NYHA class II, acute on chronic, combined (HCC) 10/18/2018  .  Hypertensive urgency 10/12/2018  . Mild renal insufficiency 10/12/2018  . Cellulitis 10/12/2018  . Penile cellulitis   . Adjustment disorder with mixed disturbance of emotions and conduct   . Sleep apnea 10/03/2018  . Hypokalemia 09/17/2018  . Scrotal swelling 09/17/2018  . Urinary hesitancy   . Constipation   . Acute on chronic combined systolic and diastolic CHF (congestive heart failure) (HCC) 08/25/2018  . Acute respiratory failure with hypoxia (HCC) 08/25/2018  . Acute on chronic combined systolic and diastolic congestive heart failure (HCC) 08/18/2018  . Tobacco use 08/18/2018  . 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 Homelessness 08/08/2018  . Smoker 08/08/2018  . Acute exacerbation of CHF (congestive heart failure) (HCC) 03/28/2018  . Prostate enlargement 03/16/2018  . Aortic atherosclerosis (HCC) 03/16/2018  . 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 Chronic foot pain   . Schizoaffective disorder, bipolar type (HCC) 09/30/2016  . Substance induced mood disorder (HCC) 03/13/2015  . Acute kidney failure (HCC) 01/26/2015  . Schizophrenia, paranoid type (HCC) 01/17/2015  . Drug hallucinosis (HCC) 10/08/2014  . Chronic paranoid schizophrenia (HCC) 09/07/2014  . 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 Urinary retention   . Cocaine use disorder, severe,  dependence (HCC)   . Essential hypertension, benign 03/28/2013  . Insulin-requiring or dependent type II diabetes mellitus (HCC) 03/15/2013   PCP:  Patient, No Pcp Per Pharmacy:   [id number] - [place], [place] - [address] [place] [address] Phone: [phone] Fax: [phone]  [facility] [place], [place] - [address] [place] [address] Phone: [phone] Fax:  [phone]  [facility] [phone] - [place], [place] - [place] [address] AT [facility] [address] [facility] [address] [place] [address] Phone: [phone] Fax: [phone]     Social Determinants of Health (SDOH) Interventions    Readmission Risk Interventions Readmission Risk Prevention Plan 10/05/2018  Transportation Screening Complete  Medication Review (RN Care Manager) Complete  PCP or Specialist appointment within 3-5 days of discharge Complete  HRI or Home Care Consult Patient refused  SW Recovery Care/Counseling Consult Complete  Palliative Care Screening Not Applicable  Skilled Nursing Facility Not Applicable  Some recent data might be hidden

## 2018-10-19 NOTE — Progress Notes (Signed)
Went over discharge papers and medications with patient.  Explained to patient he needs to go directly to the [facility], they are able to help with medications and a tent.  Patient stated he understood. Patient stated he had no further questions about medications.  Patient wheeled out to bus stop by NT.

## 2018-10-19 NOTE — TOC Transition Note (Signed)
Transition of Care [facility]) - CM/SW Discharge Note   Patient Details  Name: [name] MRN: [id number] Date of Birth: 24-Oct-1960  Transition [facility] Care [facility]) CM/SW Contact:  Lanier Clam, RN Phone Number: 10/19/2018, 2:29 PM   Clinical Narrative:  D/c t IR, then homeless shelter(provided w/shelters available.           Patient Goals and CMS Choice        Discharge Placement                       Discharge Plan and Services                          Social Determinants of Health (SDOH) Interventions     Readmission Risk Interventions Readmission Risk Prevention Plan 10/05/2018  Transportation Screening Complete  Medication Review (RN Care Manager) Complete  PCP or Specialist appointment within 3-5 days of discharge Complete  HRI or Home Care Consult Patient refused  SW Recovery Care/Counseling Consult Complete  Palliative Care Screening Not Applicable  Skilled Nursing Facility Not Applicable  Some recent data might be hidden

## 2018-10-19 NOTE — Discharge Summary (Signed)
Physician Discharge Summary  [name] VQQ:[id number] DOB: 1961/05/01 DOA: 10/17/2018  PCP: Patient, No Pcp Per  Admit date: 10/17/2018 Discharge date: 10/19/2018  Time spent: 50 minutes  Recommendations for Outpatient Follow-up:  1. Follow up Cardiology in 2 weeks  Discharge Diagnoses:  Principal Problem:   Acute on chronic combined systolic (congestive) and diastolic (congestive) heart failure (HCC) Active Problems:   Insulin-requiring or dependent type II diabetes mellitus (HCC)   Essential hypertension, benign   Cocaine use disorder, severe, dependence (HCC)   Schizoaffective disorder, bipolar type (HCC)   Homelessness   Tobacco use   Penile cellulitis   Elevated troponin   HLD (hyperlipidemia)   Anxiety   CHF (congestive heart failure), NYHA class II, acute on chronic, combined (HCC)   Discharge Condition: Stable  Diet recommendation: Heart healthy diet  Filed Weights   10/18/18 0540 10/19/18 0546  Weight: 89.7 kg 96.4 kg    History of present illness:  58 year old male with history of chronic systolic and diastolic CHF, EF 25 to 30%, Schizophrenia, homelessness came to ED with complaints of shortness of breath.  Patient was started on IV Lasix.  Also has a history of alcohol abuse.   Hospital Course:   1. Acute on chronic systolic and diastolic CHF-patient presented with worsening shortness of breath, BNP elevated to 1095.  Up from 733 on 10/14/2018.  Chest x-ray showed cardiomegaly with vascular congestion and  probable interstitial edema.  Excellent diuresis with Lasix 80 mg IV x1 last night.    Patient was started on Lasix 60 mg IV every 12 hours.  He is back to baseline.  Continue with Lasix 40 mg p.o. daily.    2. Nonsustained V. Tach-patient had multiple episodes of brief nonsustained V. tach, consulted cardiology.  Started on Coreg 6.25 mg p.o. twice daily.  Not a candidate for defibrillator due to poor social situation and homelessness.  Magnesium  has been replaced, potassium repleted.  2. Diabetes mellitus-patient started on sliding scale insulin with NovoLog.  Last A1c in January was 7.3.  Blood was well controlled in the hospital.  Continue Lantus.  3. Hypertension-blood pressure mildly elevated, continue lisinopril, Coreg.  4. Penile cellulitis-improved, patient was started on Bactrim during previous admission a week ago.  We will continue with Bactrim at this time.  5. History of alcohol abuse-did not go into alcohol withdrawal.  Counseled.   Procedures:    Consultations:  Cardiology  Discharge Exam: Vitals:   10/18/18 2225 10/19/18 0547  BP: (!) 152/93 (!) 147/93  Pulse: 89 93  Resp: 18 20  Temp: 98.4 F (36.9 C) 98.3 F (36.8 C)  SpO2: 95% 93%    General: Appears in no acute distress Cardiovascular: S1-S2, regular, no murmur auscultated Respiratory: Clear to auscultation bilaterally. Heart-S1-S2, regular, no murmur auscultated  Discharge Instructions   Discharge Instructions    Diet - low sodium heart healthy   Complete by:  As directed    Diet - low sodium heart healthy   Complete by:  As directed    Increase activity slowly   Complete by:  As directed    Increase activity slowly   Complete by:  As directed      Allergies as of 10/19/2018      Reactions   Haldol [haloperidol] Other (See Comments)   Muscle spasms, loss of voluntary movement. However, pt has taken Thorazine on multiple occasions with no adverse effects.       Medication List    STOP  taking these medications   cephALEXin 500 MG capsule Commonly known as:  KEFLEX     TAKE these medications   ARIPiprazole 10 MG tablet Commonly known as:  ABILIFY Take 1 tablet (10 mg total) by mouth daily.   aspirin EC 81 MG tablet Take 1 tablet (81 mg total) by mouth daily.   atorvastatin 40 MG tablet Commonly known as:  LIPITOR Take 1 tablet (40 mg total) by mouth daily.   bisacodyl 5 MG EC tablet Commonly known as:   DULCOLAX Take 1 tablet (5 mg total) by mouth daily as needed for moderate constipation.   carbamazepine 200 MG tablet Commonly known as:  TEGRETOL Take 0.5 tablets (100 mg total) by mouth 2 (two) times daily.   carvedilol 6.25 MG tablet Commonly known as:  COREG Take 1 tablet (6.25 mg total) by mouth 2 (two) times daily with a meal.   furosemide 40 MG tablet Commonly known as:  LASIX Take 1 tablet (40 mg total) by mouth daily.   hydrocortisone 1 % Crea Commonly known as:  Procto-Pak Apply twice a day to hemorrhoids.   Insulin Glargine 100 UNIT/ML Solostar Pen Commonly known as:  LANTUS Inject 14 Units into the skin daily.   Insulin Pen Needle 29G X Misc Use with insulin   lisinopril 10 MG tablet Commonly known as:  ZESTRIL Take 1 tablet (10 mg total) by mouth daily.   nicotine 21 mg/24hr patch Commonly known as:  NICODERM CQ - dosed in mg/24 hours Place 1 patch (21 mg total) onto the skin daily.   polyethylene glycol 17 g packet Commonly known as:  MIRALAX / GLYCOLAX Take 17 g by mouth 2 (two) times daily.   Potassium Chloride ER 20 MEQ Tbcr Take 20 mEq by mouth daily.   senna-docusate 8.6-50 MG tablet Commonly known as:  Senokot-S Take 1 tablet by mouth 2 (two) times daily.   sulfamethoxazole-trimethoprim 800-160 MG tablet Commonly known as:  BACTRIM DS Take 1 tablet by mouth 2 (two) times daily for 6 days.   tamsulosin 0.4 MG Caps capsule Commonly known as:  FLOMAX Take 1 capsule (0.4 mg total) by mouth daily after breakfast.      Allergies  Allergen Reactions  . Haldol [Haloperidol] Other (See Comments)    Muscle spasms, loss of voluntary movement. However, pt has taken Thorazine on multiple occasions with no adverse effects.    Follow-up Information    Nahser, Deloris Ping, MD Follow up in 2 week(s).   Specialty:  Cardiology Contact information: [address] [place] [address] [phone]            The results of  significant diagnostics from this hospitalization (including imaging, microbiology, ancillary and laboratory) are listed below for reference.    Significant Diagnostic Studies: Dg Chest 2 View  Result Date: 10/02/2018 CLINICAL DATA:  Cough, abdominal pain EXAM: CHEST - 2 VIEW COMPARISON:  09/21/2018 FINDINGS: Lingular scarring. No focal consolidation. No pleural effusion or pneumothorax. Cardiomegaly. Degenerative changes of the visualized thoracolumbar spine. IMPRESSION: Normal chest radiographs. Electronically Signed   By: Charline Bills M.D.   On: 10/02/2018 03:42   Dg Chest 2 View  Result Date: 09/21/2018 CLINICAL DATA:  Chest pain, groin swelling, suicidal ideation EXAM: CHEST - 2 VIEW COMPARISON:  09/20/2018 FINDINGS: Linear scarring in the lingula. Lungs otherwise clear. No pleural effusion or pneumothorax. Cardiomegaly. Mild degenerative changes of the visualized thoracolumbar spine. IMPRESSION: No evidence of acute cardiopulmonary disease. Electronically Signed  By: Charline Bills M.D.   On: 09/21/2018 19:04   Ct Abdomen Pelvis W Contrast  Result Date: 10/12/2018 CLINICAL DATA:  58 y/o M; bilateral testicular pain and swelling for 1 month. History of polysubstance abuse, hepatitis-C, diabetes. EXAM: CT ABDOMEN AND PELVIS WITH CONTRAST TECHNIQUE: Multidetector CT imaging of the abdomen and pelvis was performed using the standard protocol following bolus administration of intravenous contrast. CONTRAST:  OMNIPAQUE IOHEXOL 300 MG/ML  SOLN COMPARISON:  07/23/2018 CT abdomen and pelvis. FINDINGS: Lower chest: Increased moderate cardiomegaly. Stable metallic density within the anterior aspect of right middle lobe. Hepatobiliary: No focal liver abnormality is seen. No gallstones or biliary ductal dilatation. Small volume of fluid within the gallbladder fossa is likely related to perihepatic ascites. Pancreas: Unremarkable. No pancreatic ductal dilatation or surrounding inflammatory  changes. Spleen: Normal in size without focal abnormality. Adrenals/Urinary Tract: Adrenal glands are unremarkable. Stable right kidney cysts measuring up to 4.0 cm arising from the lower pole. Kidneys are otherwise normal, without renal calculi, focal lesion, or hydronephrosis. Diffuse bladder wall thickening. Stomach/Bowel: Stomach is within normal limits. Appendix appears normal. No evidence of bowel wall thickening, distention, or inflammatory changes. Vascular/Lymphatic: Aortic atherosclerosis. No enlarged abdominal or pelvic lymph nodes. Reproductive: Stable severe enlargement of the prostate. Other: Small volume of peritoneal ascites. Diffuse edema throughout the subcutaneous fat predominantly of lower abdomen and visible pelvis. Musculoskeletal: No fracture is seen. Stable well-marginated sclerotic foci within the pelvis compatible with bone islands. IMPRESSION: 1. Small volume of ascites and diffuse edema throughout subcutaneous fat predominantly of lower abdomen and visible pelvis. Interval increased cardiomegaly favors heart failure as the etiology. 2. Stable prostate enlargement and bladder wall thickening likely due to chronic outflow obstruction. 3.  Aortic Atherosclerosis (ICD10-I70.0). Electronically Signed   By: Mitzi Hansen M.D.   On: 10/12/2018 01:49   Dg Chest Portable 1 View  Result Date: 10/17/2018 CLINICAL DATA:  Shortness of breath. EXAM: PORTABLE CHEST 1 VIEW COMPARISON:  10/09/2018 FINDINGS: The cardio pericardial silhouette is enlarged. There is pulmonary vascular congestion with interstitial pulmonary edema. Chronic atelectasis or linear scar in the left mid lung is similar to prior. The visualized bony structures of the thorax are intact. Insert wire IMPRESSION: Cardiomegaly with vascular congestion and probable interstitial edema. Electronically Signed   By: Kennith Center M.D.   On: 10/17/2018 23:14   Dg Chest Port 1 View  Result Date: 10/09/2018 CLINICAL DATA:   Initial evaluation for acute scrotal pain and dental pain, edema. EXAM: PORTABLE CHEST 1 VIEW COMPARISON:  Prior radiograph from 10/03/2018 FINDINGS: Moderate cardiomegaly, stable. Mediastinal silhouette within normal limits. Aortic atherosclerosis. Lungs normally inflated. Linear atelectasis/scarring present within the left perihilar region, stable. Mild diffuse pulmonary interstitial congestion without overt pulmonary edema. No pleural effusion. No consolidative opacity. No pneumothorax. No acute osseous finding. Metallic density overlies the right upper quadrant, stable. IMPRESSION: 1. Stable cardiomegaly with mild diffuse pulmonary interstitial congestion without overt pulmonary edema. 2. Aortic atherosclerosis. Electronically Signed   By: [name]   On: 10/09/2018 03:43   Dg Chest Port 1 View  Result Date: 10/03/2018 CLINICAL DATA:  Swelling of the lower half of the body. Shortness of breath and cough. EXAM: PORTABLE CHEST 1 VIEW COMPARISON:  10/02/2018.  Multiple prior chest radiographs. FINDINGS: Chronic cardiomegaly with left ventricular prominence. Chronic aortic atherosclerosis. Chronic pulmonary scarring in the left mid lung. No evidence of heart failure or effusion. No active pulmonary process. IMPRESSION: No active disease. Chronic cardiomegaly, aortic atherosclerosis and  left mid lung scarring. Electronically Signed   By: Paulina FusiMark  Shogry M.D.   On: 10/03/2018 19:37   Dg Chest Port 1 View  Result Date: 09/20/2018 CLINICAL DATA:  Initial evaluation for acute shortness of breath. EXAM: PORTABLE CHEST 1 VIEW COMPARISON:  Prior radiograph from 09/17/2018 FINDINGS: Moderate cardiomegaly, stable. Mediastinal silhouette within normal limits. Lungs normally inflated. Mild diffuse pulmonary interstitial congestion/edema. Superimposed left perihilar scarring, similar to previous. No focal infiltrates. No pneumothorax. No visible pleural effusion. No acute osseous finding. Degenerative changes  noted about the partially visualized right shoulder. IMPRESSION: 1. Cardiomegaly with mild diffuse pulmonary interstitial congestion/edema. 2. Superimposed left perihilar scarring. Electronically Signed   By: Rise MuBenjamin  McClintock M.D.   On: 09/20/2018 06:36    Microbiology: No results found for this or any previous visit (from the past 240 hour(s)).   Labs: Basic Metabolic Panel: Recent Labs  Lab 10/13/18 0508 10/14/18 0540 10/17/18 1556 10/17/18 2235 10/18/18 0342 10/18/18 1025 10/19/18 0418  NA 142 138 136 135  --   --  140  K 3.1* 3.4* 4.0 3.9  --   --  3.5  CL 102 99 102 101  --   --  107  CO2 30 29 23 27   --   --  26  GLUCOSE 158* 330* 335* 372*  --   --  143*  BUN 19 21* 25* 29*  --   --  27*  CREATININE 1.13 1.26* 1.24 1.24 1.18  --  1.18  CALCIUM 8.6* 9.2 8.7* 8.6*  --   --  8.7*  MG  --   --   --   --   --  2.2  --    Liver Function Tests: No results for input(s): AST, ALT, ALKPHOS, BILITOT, PROT, ALBUMIN in the last 168 hours. No results for input(s): LIPASE, AMYLASE in the last 168 hours. No results for input(s): AMMONIA in the last 168 hours. CBC: Recent Labs  Lab 10/14/18 0540 10/17/18 2235 10/18/18 0342  WBC 8.4 7.2 6.7  NEUTROABS  --  5.5  --   HGB 10.2* 9.6* 9.5*  HCT 35.2* 32.6* 32.7*  MCV 82.8 83.8 83.0  PLT 275 245 249   Cardiac Enzymes: Recent Labs  Lab 10/17/18 2235  TROPONINI 0.03*   BNP: BNP (last 3 results) Recent Labs    10/12/18 0102 10/14/18 0540 10/17/18 2235  BNP 1,289.1* 733.0* 1,095.1*    ProBNP (last 3 results) No results for input(s): PROBNP in the last 8760 hours.  CBG: Recent Labs  Lab 10/18/18 1134 10/18/18 1622 10/18/18 2219 10/19/18 0734 10/19/18 1204  GLUCAP 176* 82 122* 135* 283*       Signed:  Meredeth IdeGagan S Exavior Kimmons MD.  [facility] 10/19/2018, 12:48 PM

## 2018-10-19 NOTE — Discharge Instructions (Signed)
Area Shelters: [facility] [address] [place] [address] [phone]  [facility] [place] [address] [place] [address] [phone]  [facility]  [address] [place] [address] [phone]

## 2018-10-19 NOTE — Care Management Important Message (Signed)
Important Message  Patient Details IM Letter given to the Case Manager to present to the Patient Name: EMMITT MANNARINO MRN: [id number] Date of Birth: 07-06-1960   Medicare Important Message Given:  Yes    Caren Macadam 10/19/2018, 12:23 PM

## 2018-10-21 ENCOUNTER — Emergency Department (HOSPITAL_COMMUNITY)
Admission: EM | Admit: 2018-10-21 | Discharge: 2018-10-21 | Disposition: A | Discharge status: Home / Self Care | Payer: Medicaid Other | Attending: Emergency Medicine | Admitting: Emergency Medicine

## 2018-10-21 ENCOUNTER — Encounter (HOSPITAL_COMMUNITY): Payer: Self-pay

## 2018-10-21 DIAGNOSIS — F17210 Nicotine dependence, cigarettes, uncomplicated: Secondary | ICD-10-CM | POA: Insufficient documentation

## 2018-10-21 DIAGNOSIS — Z79899 Other long term (current) drug therapy: Secondary | ICD-10-CM | POA: Diagnosis not present

## 2018-10-21 DIAGNOSIS — Z590 Homelessness: Secondary | ICD-10-CM | POA: Diagnosis not present

## 2018-10-21 DIAGNOSIS — R600 Localized edema: Secondary | ICD-10-CM | POA: Diagnosis not present

## 2018-10-21 DIAGNOSIS — E119 Type 2 diabetes mellitus without complications: Secondary | ICD-10-CM | POA: Insufficient documentation

## 2018-10-21 DIAGNOSIS — N4822 Cellulitis of corpus cavernosum and penis: Secondary | ICD-10-CM | POA: Diagnosis not present

## 2018-10-21 DIAGNOSIS — I5042 Chronic combined systolic (congestive) and diastolic (congestive) heart failure: Secondary | ICD-10-CM | POA: Diagnosis not present

## 2018-10-21 DIAGNOSIS — Z7982 Long term (current) use of aspirin: Secondary | ICD-10-CM | POA: Insufficient documentation

## 2018-10-21 DIAGNOSIS — M79606 Pain in leg, unspecified: Secondary | ICD-10-CM | POA: Diagnosis present

## 2018-10-21 DIAGNOSIS — I110 Hypertensive heart disease with heart failure: Secondary | ICD-10-CM | POA: Diagnosis not present

## 2018-10-21 DIAGNOSIS — R609 Edema, unspecified: Secondary | ICD-10-CM

## 2018-10-21 LAB — CBG MONITORING, ED: Glucose-Capillary: 231 mg/dL — ABNORMAL HIGH (ref 70–99)

## 2018-10-21 MED ORDER — SULFAMETHOXAZOLE-TRIMETHOPRIM 800-160 MG PO TABS: 1.0000 | ORAL_TABLET | Freq: Two times a day (BID) | ORAL | 0 refills | Status: DC

## 2018-10-21 MED ORDER — FUROSEMIDE 40 MG PO TABS
40.0000 mg | ORAL_TABLET | Freq: Once | ORAL | Status: AC
  Administered 2018-10-21: 40 mg via ORAL
  Filled 2018-10-21: qty 1

## 2018-10-21 MED ORDER — SULFAMETHOXAZOLE-TRIMETHOPRIM 800-160 MG PO TABS
1.0000 | ORAL_TABLET | Freq: Once | ORAL | Status: AC
  Administered 2018-10-21: 07:00:00 1 via ORAL
  Filled 2018-10-21: qty 1

## 2018-10-21 MED ORDER — ACETAMINOPHEN 325 MG PO TABS
650.0000 mg | ORAL_TABLET | Freq: Once | ORAL | Status: AC
  Administered 2018-10-21: 650 mg via ORAL
  Filled 2018-10-21: qty 2

## 2018-10-21 NOTE — ED Triage Notes (Signed)
Pt last used crack/cocaine about two hours ago

## 2018-10-21 NOTE — Discharge Instructions (Addendum)
Thank you for allowing me to care for you today in the Emergency Department.   Take 1 tablet of Bactrim 2 times daily.  You were given a dose in the ER today.  Take 1 tablet of Lasix by mouth daily.  You were given a dose in the ER today.

## 2018-10-21 NOTE — ED Notes (Signed)
Pt given soap and washcloths, he's currently giving himself a bath

## 2018-10-21 NOTE — ED Provider Notes (Signed)
[facility]-EMERGENCY DEPT Provider Note   CSN: [id number] Arrival date & time: 10/21/18  0454    History   Chief Complaint Chief Complaint  Patient presents with  . Foot Pain  . Suicidal    HPI [name] is a 58 y.o. male with a history of cocaine abuse, diabetes mellitus, HTN, schizophrenia, sleep apnea, chronic systolic and diastolic heart failure, and homelessness who presents to the emergency department by EMS with a chief complaint of bilateral lower leg pain.  The patient reports that his chronic neuropathy is causing him pain and he would like some Tylenol.  He would also like some graham crackers and milk.  He also expresses concern for the infection on his penis.  He states he was given a prescription for "green pills" when he was discharged from the hospital, but lost the prescription.  He also reports significant improvement in scrotal edema.  He also reports that he would like to be on suicide watch and is requesting the door to be open.  He would like to have a Sprite with his Tylenol.  Per triage staff, the patient was crying in triage on arrival.  Triage staff reports that the patient proclaimed that he had not had a bath in over a year.  Patient arrived in close soiled with fecal matter and was given a change of clothes after he was able to bathe himself.  He reports that he did use a small amount of cocaine approximately 2 hours prior to arrival, but he is denying shortness of breath, chest pain.  He denies cough, fever, chills, palpitations, nausea, vomiting, diarrhea, abdominal pain, or back pain.     The history is provided by the patient. No language interpreter was used.    Past Medical History:  Diagnosis Date  . Chronic foot pain   . Cocaine abuse (HCC)   . Diabetes mellitus without complication (HCC)   . Hepatitis C    unsure   . Homelessness   . Hypertension   . Neuropathy   . Polysubstance abuse (HCC)   .  Schizophrenia (HCC)   . Sleep apnea   . Systolic and diastolic CHF, chronic [facility])     Patient Active Problem List   Diagnosis Date Noted  . Elevated troponin 10/18/2018  . HLD (hyperlipidemia) 10/18/2018  . Anxiety 10/18/2018  . CHF (congestive heart failure), NYHA class II, acute on chronic, combined (HCC) 10/18/2018  . Hypertensive urgency 10/12/2018  . Mild renal insufficiency 10/12/2018  . Cellulitis 10/12/2018  . Penile cellulitis   . Adjustment disorder with mixed disturbance of emotions and conduct   . Sleep apnea 10/03/2018  . Hypokalemia 09/17/2018  . Scrotal swelling 09/17/2018  . Urinary hesitancy   . Constipation   . Acute on chronic combined systolic and diastolic CHF (congestive heart failure) (HCC) 08/25/2018  . Acute respiratory failure with hypoxia (HCC) 08/25/2018  . Acute on chronic combined systolic and diastolic congestive heart failure (HCC) 08/18/2018  . Tobacco use 08/18/2018  . 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 Homelessness 08/08/2018  . Smoker 08/08/2018  . Acute exacerbation of CHF (congestive heart failure) (HCC) 03/28/2018  . Prostate enlargement 03/16/2018  . Aortic atherosclerosis (HCC) 03/16/2018  . 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 Chronic foot pain   . Schizoaffective disorder, bipolar type (HCC) 09/30/2016  . Substance induced mood disorder (HCC) 03/13/2015  . Acute kidney failure (HCC) 01/26/2015  .  Schizophrenia, paranoid type (HCC) 01/17/2015  . Drug hallucinosis (HCC) 10/08/2014  . Chronic paranoid schizophrenia (HCC) 09/07/2014  . 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 Urinary retention   . Cocaine use disorder, severe, dependence (HCC)   . Essential hypertension, benign 03/28/2013  . Insulin-requiring or dependent type II diabetes mellitus (HCC) 03/15/2013    Past  Surgical History:  Procedure Laterality Date  . MULTIPLE TOOTH EXTRACTIONS          Home Medications    Prior to Admission medications   Medication Sig Start Date End Date Taking? Authorizing Provider  ARIPiprazole (ABILIFY) 10 MG tablet Take 1 tablet (10 mg total) by mouth daily. 09/18/18   Meredeth Ide, MD  aspirin EC 81 MG tablet Take 1 tablet (81 mg total) by mouth daily. 10/19/18 02/16/19  Meredeth Ide, MD  atorvastatin (LIPITOR) 40 MG tablet Take 1 tablet (40 mg total) by mouth daily. 09/18/18   Meredeth Ide, MD  bisacodyl (DULCOLAX) 5 MG EC tablet Take 1 tablet (5 mg total) by mouth daily as needed for moderate constipation. 09/04/18   Rodolph Bong, MD  carbamazepine (TEGRETOL) 200 MG tablet Take 0.5 tablets (100 mg total) by mouth 2 (two) times daily. 09/18/18   Meredeth Ide, MD  carvedilol (COREG) 6.25 MG tablet Take 1 tablet (6.25 mg total) by mouth 2 (two) times daily with a meal. 10/19/18   Meredeth Ide, MD  furosemide (LASIX) 40 MG tablet Take 1 tablet (40 mg total) by mouth daily. 09/24/18   Derwood Kaplan, MD  hydrocortisone (PROCTO-PAK) 1 % CREA Apply twice a day to hemorrhoids. 09/29/18   Renne Crigler, PA-C  Insulin Glargine (LANTUS) 100 UNIT/ML Solostar Pen Inject 14 Units into the skin daily. 09/18/18   Meredeth Ide, MD  Insulin Pen Needle 29G X MISC Use with insulin 09/18/18   Meredeth Ide, MD  lisinopril (PRINIVIL,ZESTRIL) 10 MG tablet Take 1 tablet (10 mg total) by mouth daily. 09/18/18   Meredeth Ide, MD  nicotine (NICODERM CQ - DOSED IN MG/24 HOURS) 21 mg/24hr patch Place 1 patch (21 mg total) onto the skin daily. 09/04/18   Rodolph Bong, MD  polyethylene glycol [facility] / Ethelene Hal) packet Take 17 g by mouth 2 (two) times daily. 09/18/18   Meredeth Ide, MD  Potassium Chloride ER 20 MEQ TBCR Take 20 mEq by mouth daily. 09/18/18   Meredeth Ide, MD  senna-docusate (SENOKOT-S) 8.6-50 MG tablet Take 1 tablet by mouth 2 (two) times daily. 09/04/18   Rodolph Bong, MD  sulfamethoxazole-trimethoprim (BACTRIM DS) 800-160 MG tablet Take 1 tablet by mouth 2 (two) times daily for 6 days. 10/17/18 10/23/18  Aviva Kluver B, PA-C  tamsulosin (FLOMAX) 0.4 MG CAPS capsule Take 1 capsule (0.4 mg total) by mouth daily after breakfast. 10/05/18   Narda Bonds, MD    Family History Family History  Problem Relation Age of Onset  . Hypertension Other   . Diabetes Other     Social History Social History   Tobacco Use  . Smoking status: Current Every Day Smoker    Packs/day: 1.00    Years: 20.00    Pack years: 20.00    Types: Cigarettes  . Smokeless tobacco: Current User  Substance Use Topics  . Alcohol use: Yes    Comment: Daily Drinker   . Drug use: Yes    Frequency: 7.0 times per  week    Types: "Crack" cocaine, Cocaine, Marijuana     Allergies   Haldol [haloperidol]   Review of Systems Review of Systems  Constitutional: Negative for appetite change and fever.  HENT: Negative for congestion and sore throat.   Respiratory: Negative for shortness of breath and wheezing.   Cardiovascular: Positive for leg swelling. Negative for chest pain and palpitations.  Gastrointestinal: Negative for abdominal pain, blood in stool, diarrhea, nausea and vomiting.  Genitourinary: Positive for scrotal swelling. Negative for decreased urine volume, difficulty urinating, dysuria, flank pain, genital sores, penile pain, penile swelling and testicular pain.  Musculoskeletal: Positive for arthralgias and myalgias. Negative for back pain.  Skin: Positive for wound. Negative for rash.  Allergic/Immunologic: Negative for immunocompromised state.  Neurological: Negative for weakness, numbness and headaches.  Psychiatric/Behavioral: Negative for confusion.     Physical Exam Updated Vital Signs BP (!) 153/110 (BP Location: Left Arm)   Pulse 73   Temp 97.8 F (36.6 C) (Oral)   Resp 16   SpO2 95%   Physical Exam Vitals signs and nursing note reviewed.   Constitutional:      Appearance: He is well-developed.  HENT:     Head: Normocephalic.     Right Ear: Tympanic membrane and ear canal normal.     Left Ear: Tympanic membrane normal.     Nose: Nose normal.     Mouth/Throat:     Pharynx: No oropharyngeal exudate or posterior oropharyngeal erythema.  Eyes:     General: Scleral icterus present.     Conjunctiva/sclera: Conjunctivae normal.     Pupils: Pupils are equal, round, and reactive to light.  Neck:     Musculoskeletal: Neck supple.  Cardiovascular:     Rate and Rhythm: Normal rate and regular rhythm.     Heart sounds: No murmur. No friction rub. No gallop.   Pulmonary:     Effort: Pulmonary effort is normal. No respiratory distress.     Breath sounds: No stridor. No wheezing, rhonchi or rales.  Chest:     Chest wall: No tenderness.  Abdominal:     General: There is distension.     Palpations: Abdomen is soft. There is no mass.     Tenderness: There is no abdominal tenderness. There is no right CVA tenderness, left CVA tenderness, guarding or rebound.     Hernia: No hernia is present.  Musculoskeletal:     Right lower leg: Edema present.     Left lower leg: Edema present.     Comments: Mild pitting edema noted to the bilateral lower extremities.  Skin:    General: Skin is warm and dry.  Neurological:     General: No focal deficit present.     Mental Status: He is alert.  Psychiatric:        Behavior: Behavior normal.      ED Treatments / Results  Labs (all labs ordered are listed, but only abnormal results are displayed) Labs Reviewed - No data to display  EKG None  Radiology No results found.  Procedures Procedures (including critical care time)  Medications Ordered in ED Medications - No data to display   Initial Impression / Assessment and Plan / ED Course  I have reviewed the triage vital signs and the nursing notes.  Pertinent labs & imaging results that were available during my care of the  patient were reviewed by me and considered in my medical decision making (see chart for details).  58 year old male with a history of cocaine abuse, diabetes mellitus, HTN, schizophrenia, sleep apnea, chronic systolic and diastolic heart failure, and homelessness presenting by EMS with complaints of bilateral lower extremity pain.  The patient was seen and independently evaluated by Dr. Elesa Massed, attending physician.  This patient is well-known to both me and Dr. Elesa Massed as he has had a 51 visits to the ER in the last 6 months.  On exam, lower extremity edema is much improved as is anasarca and scrotal edema.  This is the likely secondary to the fact that the patient was just discharged from the hospital less than 24 hours ago.  He does endorse cocaine use x1 since discharge, but is having no chest pain or shortness of breath at this time.  Patient's penile cellulitis was also rechecked.  Exam was not concerning for Fournier's gangrene.  There is no warmth or edema and only minimal purulent discharge.  He was given a dose of Bactrim in the ER as well as his home dose of Lasix for mild lower extremity edema.  He was also given Tylenol for peripheral neuropathy.  Labs from 24 hours ago have been reviewed and are reassuring.  At this time, I feel that no further urgent or emergent work-up is indicated.  He has been given a new prescription of Bactrim as he indicates that he lost his previous prescription.  He is hemodynamically stable and in no acute distress.  He is safe for discharge with outpatient follow-up at this time.  Final Clinical Impressions(s) / ED Diagnoses   Final diagnoses:  None    ED Discharge Orders    None       Barkley Boards, PA-C 10/21/18 0737    Ward, Layla Maw, DO 10/21/18 2317

## 2018-10-21 NOTE — ED Triage Notes (Signed)
Pt was at the [employer] and called EMS because of bilateral foot pain, pt also states that he's suicidal with his plan of to get killed Pt is crying in triage, he told EMS he hasn't had a bath in a year

## 2018-10-21 NOTE — ED Notes (Signed)
Pt given urinal for BR needs 

## 2018-10-21 NOTE — ED Notes (Signed)
Bed: WTR6 Expected date:  Expected time:  Means of arrival:  Comments: 

## 2018-10-22 ENCOUNTER — Other Ambulatory Visit: Payer: Self-pay

## 2018-10-22 ENCOUNTER — Emergency Department (HOSPITAL_COMMUNITY)
Admission: EM | Admit: 2018-10-22 | Discharge: 2018-10-22 | Disposition: A | Discharge status: Home / Self Care | Payer: Medicaid Other | Source: Home / Self Care | Attending: Emergency Medicine | Admitting: Emergency Medicine

## 2018-10-22 ENCOUNTER — Encounter (HOSPITAL_COMMUNITY): Payer: Self-pay | Admitting: Emergency Medicine

## 2018-10-22 ENCOUNTER — Emergency Department (HOSPITAL_COMMUNITY)
Admission: EM | Admit: 2018-10-22 | Discharge: 2018-10-22 | Disposition: A | Discharge status: Home / Self Care | Payer: Medicaid Other | Attending: Emergency Medicine | Admitting: Emergency Medicine

## 2018-10-22 DIAGNOSIS — M79673 Pain in unspecified foot: Secondary | ICD-10-CM | POA: Insufficient documentation

## 2018-10-22 DIAGNOSIS — Z590 Homelessness: Secondary | ICD-10-CM | POA: Insufficient documentation

## 2018-10-22 DIAGNOSIS — Z79899 Other long term (current) drug therapy: Secondary | ICD-10-CM | POA: Diagnosis not present

## 2018-10-22 DIAGNOSIS — Z7982 Long term (current) use of aspirin: Secondary | ICD-10-CM

## 2018-10-22 DIAGNOSIS — I110 Hypertensive heart disease with heart failure: Secondary | ICD-10-CM | POA: Diagnosis not present

## 2018-10-22 DIAGNOSIS — E119 Type 2 diabetes mellitus without complications: Secondary | ICD-10-CM | POA: Insufficient documentation

## 2018-10-22 DIAGNOSIS — Z794 Long term (current) use of insulin: Secondary | ICD-10-CM

## 2018-10-22 DIAGNOSIS — F419 Anxiety disorder, unspecified: Secondary | ICD-10-CM

## 2018-10-22 DIAGNOSIS — F209 Schizophrenia, unspecified: Secondary | ICD-10-CM | POA: Diagnosis not present

## 2018-10-22 DIAGNOSIS — F17210 Nicotine dependence, cigarettes, uncomplicated: Secondary | ICD-10-CM | POA: Insufficient documentation

## 2018-10-22 DIAGNOSIS — R600 Localized edema: Secondary | ICD-10-CM | POA: Diagnosis not present

## 2018-10-22 DIAGNOSIS — I5040 Unspecified combined systolic (congestive) and diastolic (congestive) heart failure: Secondary | ICD-10-CM | POA: Insufficient documentation

## 2018-10-22 DIAGNOSIS — G8929 Other chronic pain: Secondary | ICD-10-CM

## 2018-10-22 DIAGNOSIS — R609 Edema, unspecified: Secondary | ICD-10-CM

## 2018-10-22 DIAGNOSIS — R2243 Localized swelling, mass and lump, lower limb, bilateral: Secondary | ICD-10-CM | POA: Diagnosis present

## 2018-10-22 DIAGNOSIS — N4822 Cellulitis of corpus cavernosum and penis: Secondary | ICD-10-CM | POA: Insufficient documentation

## 2018-10-22 DIAGNOSIS — F1410 Cocaine abuse, uncomplicated: Secondary | ICD-10-CM | POA: Insufficient documentation

## 2018-10-22 LAB — CBG MONITORING, ED: Glucose-Capillary: 372 mg/dL — ABNORMAL HIGH (ref 70–99)

## 2018-10-22 MED ORDER — ACETAMINOPHEN 500 MG PO TABS
1000.0000 mg | ORAL_TABLET | Freq: Once | ORAL | Status: AC
  Administered 2018-10-22: 1000 mg via ORAL
  Filled 2018-10-22: qty 2

## 2018-10-22 MED ORDER — SULFAMETHOXAZOLE-TRIMETHOPRIM 800-160 MG PO TABS: 1.0000 | ORAL_TABLET | Freq: Two times a day (BID) | ORAL | 0 refills | Status: AC

## 2018-10-22 MED ORDER — CEPHALEXIN 500 MG PO CAPS: 500.0000 mg | ORAL_CAPSULE | Freq: Four times a day (QID) | ORAL | 0 refills | Status: DC

## 2018-10-22 MED ORDER — SULFAMETHOXAZOLE-TRIMETHOPRIM 800-160 MG PO TABS
1.0000 | ORAL_TABLET | Freq: Once | ORAL | Status: AC
  Administered 2018-10-22: 02:00:00 1 via ORAL
  Filled 2018-10-22: qty 1

## 2018-10-22 MED ORDER — CEPHALEXIN 250 MG PO CAPS
500.0000 mg | ORAL_CAPSULE | Freq: Once | ORAL | Status: AC
  Administered 2018-10-22: 02:00:00 500 mg via ORAL
  Filled 2018-10-22: qty 2

## 2018-10-22 NOTE — ED Notes (Signed)
Pt removed by [employer]

## 2018-10-22 NOTE — ED Notes (Signed)
CBG results of 372 mg/dL reported to Greig Castilla, Charity fundraiser.

## 2018-10-22 NOTE — ED Triage Notes (Signed)
BIB EMS, pt just DC around 0200. Reports continuing foot pain. Pt aggressive and yelling in room.

## 2018-10-22 NOTE — ED Triage Notes (Signed)
  Patient BIB EMS for SOB and hyperglycemia.  Patient states he also has penile pain that is 10/10.  Patient states he smoke some crack cocaine about 3 hours ago and has been drinking.  Patient does have some bilateral leg swelling.  Patient is A&O x4.  CBG via EMS >400

## 2018-10-22 NOTE — ED Provider Notes (Signed)
[facility] EMERGENCY DEPARTMENT Provider Note   CSN: [id number] Arrival date & time: 10/22/18  5638    History   Chief Complaint Chief Complaint  Patient presents with  . Foot Pain    HPI [name] is a 58 y.o. male.     The history is provided by the patient. No language interpreter was used.  Foot Pain  This is a chronic problem. The current episode started more than 1 week ago. The problem occurs constantly. The problem has not changed since onset.Pertinent negatives include no shortness of breath. He has tried acetaminophen for the symptoms. The treatment provided no relief.    Past Medical History:  Diagnosis Date  . Chronic foot pain   . Cocaine abuse (HCC)   . Diabetes mellitus without complication (HCC)   . Hepatitis C    unsure   . Homelessness   . Hypertension   . Neuropathy   . Polysubstance abuse (HCC)   . Schizophrenia (HCC)   . Sleep apnea   . Systolic and diastolic CHF, chronic [facility])     Patient Active Problem List   Diagnosis Date Noted  . Elevated troponin 10/18/2018  . HLD (hyperlipidemia) 10/18/2018  . Anxiety 10/18/2018  . CHF (congestive heart failure), NYHA class II, acute on chronic, combined (HCC) 10/18/2018  . Hypertensive urgency 10/12/2018  . Mild renal insufficiency 10/12/2018  . Cellulitis 10/12/2018  . Penile cellulitis   . Adjustment disorder with mixed disturbance of emotions and conduct   . Sleep apnea 10/03/2018  . Hypokalemia 09/17/2018  . Scrotal swelling 09/17/2018  . Urinary hesitancy   . Constipation   . Acute on chronic combined systolic and diastolic CHF (congestive heart failure) (HCC) 08/25/2018  . Acute respiratory failure with hypoxia (HCC) 08/25/2018  . Acute on chronic combined systolic and diastolic congestive heart failure (HCC) 08/18/2018  . Tobacco use 08/18/2018  . 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  Homelessness 08/08/2018  . Smoker 08/08/2018  . Acute exacerbation of CHF (congestive heart failure) (HCC) 03/28/2018  . Prostate enlargement 03/16/2018  . Aortic atherosclerosis (HCC) 03/16/2018  . 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 Chronic foot pain   . Schizoaffective disorder, bipolar type (HCC) 09/30/2016  . Substance induced mood disorder (HCC) 03/13/2015  . Acute kidney failure (HCC) 01/26/2015  . Schizophrenia, paranoid type (HCC) 01/17/2015  . Drug hallucinosis (HCC) 10/08/2014  . Chronic paranoid schizophrenia (HCC) 09/07/2014  . 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 Urinary retention   . Cocaine use disorder, severe, dependence (HCC)   . Essential hypertension, benign 03/28/2013  . 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 MULTIPLE TOOTH EXTRACTIONS          Home Medications    Prior to Admission medications   Medication Sig Start Date End Date Taking? Authorizing Provider  ARIPiprazole (ABILIFY) 10 MG tablet Take 1 tablet (10 mg total) by mouth daily. 09/18/18   Meredeth Ide, MD  aspirin EC 81 MG tablet Take 1 tablet (81 mg total) by mouth daily. 10/19/18 02/16/19  Meredeth Ide, MD  atorvastatin (LIPITOR) 40 MG tablet Take 1 tablet (40 mg total) by mouth daily. 09/18/18   Meredeth Ide, MD  bisacodyl (DULCOLAX) 5 MG EC tablet Take 1 tablet (5 mg total) by mouth  daily as needed for moderate constipation. 09/04/18   Rodolph Bong, MD  carbamazepine (TEGRETOL) 200 MG tablet Take 0.5 tablets (100 mg total) by mouth 2 (two) times daily. 09/18/18   Meredeth Ide, MD  carvedilol (COREG) 6.25 MG tablet Take 1 tablet (6.25 mg total) by mouth 2 (two) times daily with a meal. 10/19/18   Sharl Ma, Sarina Ill, MD  cephALEXin (KEFLEX) 500 MG capsule Take 1 capsule (500 mg total) by mouth 4 (four) times daily. 10/22/18    Roxy Horseman, PA-C  furosemide (LASIX) 40 MG tablet Take 1 tablet (40 mg total) by mouth daily. 09/24/18   Derwood Kaplan, MD  hydrocortisone (PROCTO-PAK) 1 % CREA Apply twice a day to hemorrhoids. 09/29/18   Renne Crigler, PA-C  Insulin Glargine (LANTUS) 100 UNIT/ML Solostar Pen Inject 14 Units into the skin daily. 09/18/18   Meredeth Ide, MD  Insulin Pen Needle 29G X MISC Use with insulin 09/18/18   Meredeth Ide, MD  lisinopril (PRINIVIL,ZESTRIL) 10 MG tablet Take 1 tablet (10 mg total) by mouth daily. 09/18/18   Meredeth Ide, MD  nicotine (NICODERM CQ - DOSED IN MG/24 HOURS) 21 mg/24hr patch Place 1 patch (21 mg total) onto the skin daily. 09/04/18   Rodolph Bong, MD  polyethylene glycol [facility] / Ethelene Hal) packet Take 17 g by mouth 2 (two) times daily. 09/18/18   Meredeth Ide, MD  Potassium Chloride ER 20 MEQ TBCR Take 20 mEq by mouth daily. 09/18/18   Meredeth Ide, MD  senna-docusate (SENOKOT-S) 8.6-50 MG tablet Take 1 tablet by mouth 2 (two) times daily. 09/04/18   Rodolph Bong, MD  sulfamethoxazole-trimethoprim (BACTRIM DS) 800-160 MG tablet Take 1 tablet by mouth 2 (two) times daily for 7 days. 10/22/18 10/29/18  Roxy Horseman, PA-C  tamsulosin (FLOMAX) 0.4 MG CAPS capsule Take 1 capsule (0.4 mg total) by mouth daily after breakfast. 10/05/18   Narda Bonds, MD    Family History Family History  Problem Relation Age of Onset  . Hypertension Other   . Diabetes Other     Social History Social History   Tobacco Use  . Smoking status: Current Every Day Smoker    Packs/day: 1.00    Years: 20.00    Pack years: 20.00    Types: Cigarettes  . Smokeless tobacco: Current User  Substance Use Topics  . Alcohol use: Yes    Comment: Daily Drinker   . Drug use: Yes    Frequency: 7.0 times per week    Types: "Crack" cocaine, Cocaine, Marijuana     Allergies   Haldol [haloperidol]   Review of Systems Review of Systems  Respiratory: Negative for shortness of  breath.   All other systems reviewed and are negative.    Physical Exam Updated Vital Signs There were no vitals taken for this visit.  Physical Exam Vitals signs and nursing note reviewed.  Constitutional:      Appearance: He is well-developed.  HENT:     Head: Normocephalic and atraumatic.  Eyes:     Conjunctiva/sclera: Conjunctivae normal.  Neck:     Musculoskeletal: Neck supple.  Cardiovascular:     Rate and Rhythm: Normal rate and regular rhythm.     Pulses: Normal pulses.     Heart sounds: No murmur.     Comments: Intact distal pulses Pulmonary:     Effort: Pulmonary effort is normal. No respiratory distress.     Breath sounds: Normal breath sounds.  Comments: CTAB Abdominal:     Palpations: Abdomen is soft.     Tenderness: There is no abdominal tenderness.  Genitourinary:    Comments: Mild swelling, no evidence of fournier's gangrene, no drainable abscess, no discharge, unchanged since earlier today. Musculoskeletal:     Comments: 2+ pitting edema, no change since earlier this morning  Skin:    General: Skin is warm and dry.  Neurological:     Mental Status: He is alert.      ED Treatments / Results  Labs (all labs ordered are listed, but only abnormal results are displayed) Labs Reviewed - No data to display  EKG None  Radiology No results found.  Procedures Procedures (including critical care time)  Medications Ordered in ED Medications - No data to display   Initial Impression / Assessment and Plan / ED Course  I have reviewed the triage vital signs and the nursing notes.  Pertinent labs & imaging results that were available during my care of the patient were reviewed by me and considered in my medical decision making (see chart for details).        No new complaints.  Chronic pain.   MSE complete.  Vitals:   10/22/18 0516  BP: (!) 163/101  Pulse: 98  Resp: (!) 22  Temp: 98.9 F (37.2 C)  SpO2: 98%     Final Clinical  Impressions(s) / ED Diagnoses   Final diagnoses:  Chronic foot pain, unspecified laterality    ED Discharge Orders    None       Roxy HorsemanBrowning, Keishawna Carranza, PA-C 10/22/18 0526    Mesner, Barbara CowerJason, MD 10/22/18 [id number]

## 2018-10-22 NOTE — ED Provider Notes (Signed)
[facility] EMERGENCY DEPARTMENT Provider Note   CSN: [id number] Arrival date & time: 10/22/18  0132    History   Chief Complaint Chief Complaint  Patient presents with   Shortness of Breath   Hyperglycemia    HPI [name] is a 58 y.o. male.     Patient with multiple past medical problems as listed, but including CHF, diabetes, and penile cellulitis presents to the emergency department with a chief complaint of having run out of his antibiotics, or having had them stolen.  He also reports swelling on his legs.  Patient is well-known to this emergency department.  I saw the patient last week, and admitted him for CHF exacerbation, given low O2 saturation.  Today, the patient mainly complains of chronic foot pain.  He denies any worsening of his symptoms.  Denies any fever, chills, cough.  Denies any other associated symptoms.  The history is provided by the patient. No language interpreter was used.    Past Medical History:  Diagnosis Date   Chronic foot pain    Cocaine abuse (HCC)    Diabetes mellitus without complication (HCC)    Hepatitis C    unsure    Homelessness    Hypertension    Neuropathy    Polysubstance abuse (HCC)    Schizophrenia (HCC)    Sleep apnea    Systolic and diastolic CHF, chronic (HCC)     Patient Active Problem List   Diagnosis Date Noted   Elevated troponin 10/18/2018   HLD (hyperlipidemia) 10/18/2018   Anxiety 10/18/2018   CHF (congestive heart failure), NYHA class II, acute on chronic, combined (HCC) 10/18/2018   Hypertensive urgency 10/12/2018   Mild renal insufficiency 10/12/2018   Cellulitis 10/12/2018   Penile cellulitis    Adjustment disorder with mixed disturbance of emotions and conduct    Sleep apnea 10/03/2018   Hypokalemia 09/17/2018   Scrotal swelling 09/17/2018   Urinary hesitancy    Constipation    Acute on chronic combined systolic and diastolic CHF (congestive  heart failure) (HCC) 08/25/2018   Acute respiratory failure with hypoxia (HCC) 08/25/2018   Acute on chronic combined systolic and diastolic congestive heart failure (HCC) 08/18/2018   Tobacco use 08/18/2018   Acute on chronic combined systolic (congestive) and diastolic (congestive) heart failure (HCC) 08/08/2018   Homelessness 08/08/2018   Smoker 08/08/2018   Acute exacerbation of CHF (congestive heart failure) (HCC) 03/28/2018   Prostate enlargement 03/16/2018   Aortic atherosclerosis (HCC) 03/16/2018   Aneurysm of abdominal aorta (HCC) 03/16/2018   Chronic foot pain    Schizoaffective disorder, bipolar type (HCC) 09/30/2016   Substance induced mood disorder (HCC) 03/13/2015   Acute kidney failure (HCC) 01/26/2015   Schizophrenia, paranoid type (HCC) 01/17/2015   Drug hallucinosis (HCC) 10/08/2014   Chronic paranoid schizophrenia (HCC) 09/07/2014   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 Authorizing Provider  ARIPiprazole (ABILIFY) 10 MG tablet Take 1 tablet (10 mg total) by mouth daily. 09/18/18   Meredeth Ide, MD  aspirin  EC 81 MG tablet Take 1 tablet (81 mg total) by mouth daily. 10/19/18 02/16/19  Meredeth IdeLama, Gagan S, MD  atorvastatin (LIPITOR) 40 MG tablet Take 1 tablet (40 mg total) by mouth daily. 09/18/18   Meredeth IdeLama, Gagan S, MD  bisacodyl (DULCOLAX) 5 MG EC tablet Take 1 tablet (5 mg total) by mouth daily as needed for moderate constipation. 09/04/18   Rodolph Bonghompson, Daniel V, MD  carbamazepine (TEGRETOL) 200 MG tablet  Take 0.5 tablets (100 mg total) by mouth 2 (two) times daily. 09/18/18   Meredeth IdeLama, Gagan S, MD  carvedilol (COREG) 6.25 MG tablet Take 1 tablet (6.25 mg total) by mouth 2 (two) times daily with a meal. 10/19/18   Sharl MaLama, Sarina IllGagan S, MD  cephALEXin (KEFLEX) 500 MG capsule Take 1 capsule (500 mg total) by mouth 4 (four) times daily. 10/22/18   Roxy HorsemanBrowning, Jessieca Rhem, PA-C  furosemide (LASIX) 40 MG tablet Take 1 tablet (40 mg total) by mouth daily. 09/24/18   Derwood KaplanNanavati, Ankit, MD  hydrocortisone (PROCTO-PAK) 1 % CREA Apply twice a day to hemorrhoids. 09/29/18   Renne CriglerGeiple, Joshua, PA-C  Insulin Glargine (LANTUS) 100 UNIT/ML Solostar Pen Inject 14 Units into the skin daily. 09/18/18   Meredeth IdeLama, Gagan S, MD  Insulin Pen Needle 29G X 8MM MISC Use with insulin 09/18/18   Meredeth IdeLama, Gagan S, MD  lisinopril (PRINIVIL,ZESTRIL) 10 MG tablet Take 1 tablet (10 mg total) by mouth daily. 09/18/18   Meredeth IdeLama, Gagan S, MD  nicotine (NICODERM CQ - DOSED IN MG/24 HOURS) 21 mg/24hr patch Place 1 patch (21 mg total) onto the skin daily. 09/04/18   Rodolph Bonghompson, Daniel V, MD  polyethylene glycol [facility](MIRALAX / Ethelene HalGLYCOLAX) packet Take 17 g by mouth 2 (two) times daily. 09/18/18   Meredeth IdeLama, Gagan S, MD  Potassium Chloride ER 20 MEQ TBCR Take 20 mEq by mouth daily. 09/18/18   Meredeth IdeLama, Gagan S, MD  senna-docusate (SENOKOT-S) 8.6-50 MG tablet Take 1 tablet by mouth 2 (two) times daily. 09/04/18   Rodolph Bonghompson, Daniel V, MD  sulfamethoxazole-trimethoprim (BACTRIM DS) 800-160 MG tablet Take 1 tablet by mouth 2 (two) times daily for 7 days. 10/22/18 10/29/18  Roxy HorsemanBrowning, Krystina Strieter, PA-C  tamsulosin (FLOMAX) 0.4 MG CAPS capsule Take 1 capsule (0.4 mg total) by mouth daily after breakfast. 10/05/18   Narda BondsNettey, Ralph A, MD    Family History Family History  Problem Relation Age of Onset   Hypertension Other    Diabetes Other     Social History Social History   Tobacco Use   Smoking status: Current Every Day Smoker    Packs/day: 1.00    Years: 20.00    Pack years: 20.00    Types: Cigarettes    Smokeless tobacco: Current User  Substance Use Topics   Alcohol use: Yes    Comment: Daily Drinker    Drug use: Yes    Frequency: 7.0 times per week    Types: "Crack" cocaine, Cocaine, Marijuana     Allergies   Haldol [haloperidol]   Review of Systems Review of Systems  All other systems reviewed and are negative.    Physical Exam Updated Vital Signs BP (!) 159/94 (BP Location: Right Arm)    Pulse (!) 104    Temp 98.6 F (37 C) (Oral)    Resp 18    Ht 5\' 9"  (1.753 m)    Wt 96.4 kg    SpO2 94%    BMI 31.38 kg/m   Physical Exam Vitals signs and nursing note reviewed.  Constitutional:  Appearance: He is well-developed.  HENT:     Head: Normocephalic and atraumatic.  Eyes:     Conjunctiva/sclera: Conjunctivae normal.  Neck:     Musculoskeletal: Neck supple.  Cardiovascular:     Rate and Rhythm: Normal rate and regular rhythm.     Heart sounds: No murmur.  Pulmonary:     Effort: Pulmonary effort is normal. No respiratory distress.     Breath sounds: Normal breath sounds.     Comments: Lung sounds are clear Abdominal:     Palpations: Abdomen is soft.     Tenderness: There is no abdominal tenderness.  Genitourinary:    Comments: penis still has some swelling, but there is no longer any discharge Musculoskeletal:     Right lower leg: Edema present.     Left lower leg: Edema present.     Comments: Peripheral edema in bilateral lower extremities, approximately 2+, but much better than previously  Skin:    General: Skin is warm and dry.  Neurological:     Mental Status: He is alert.      ED Treatments / Results  Labs (all labs ordered are listed, but only abnormal results are displayed) Labs Reviewed  CBG MONITORING, ED - Abnormal; Notable for the following components:      Result Value   Glucose-Capillary 372 (*)    All other components within normal limits    EKG None  Radiology No results found.  Procedures Procedures (including critical care  time)  Medications Ordered in ED Medications  acetaminophen (TYLENOL) tablet 1,000 mg (has no administration in time range)  cephALEXin (KEFLEX) capsule 500 mg (has no administration in time range)  sulfamethoxazole-trimethoprim (BACTRIM DS) 800-160 MG per tablet 1 tablet (has no administration in time range)     Initial Impression / Assessment and Plan / ED Course  I have reviewed the triage vital signs and the nursing notes.  Pertinent labs & imaging results that were available during my care of the patient were reviewed by me and considered in my medical decision making (see chart for details).        Patient with CHF, penile cellulitis, and chronic pain.  Presents again tonight with pain in his bilateral feet.  He continues to have persistent bilateral peripheral edema, but it actually looks better than it has in the past.  Patient looks significantly improved from when I saw him last week and admitted him to the hospital for CHF exacerbation.  His penile cellulitis, while there is still some swelling present, there is no longer any discharge, and it is nontender, I am unable to express any purulence.  His O2 saturation is greater than 90%.  His lung sounds are clear.  He is in no acute distress on my exam.  I do not feel that any further work-up is indicated at this time.  Patient did state that he lost his antibiotics, or had them stolen.  I will give him a new prescription and give him a dose here tonight.  Final Clinical Impressions(s) / ED Diagnoses   Final diagnoses:  Peripheral edema  Penile cellulitis    ED Discharge Orders         Ordered    sulfamethoxazole-trimethoprim (BACTRIM DS) 800-160 MG tablet  2 times daily     10/22/18 0156    cephALEXin (KEFLEX) 500 MG capsule  4 times daily     10/22/18 0156           Roxy Horseman, PA-C 10/22/18  0865    Marily Memos, MD 10/22/18 [phone]

## 2018-10-22 NOTE — Discharge Instructions (Addendum)
Take your medications as directed.  Follow-up with the urologist ASAP.  Return for new or worsening symptoms.

## 2018-10-22 NOTE — ED Notes (Signed)
PT currently verbally aggressive with staff

## 2018-10-26 ENCOUNTER — Encounter (HOSPITAL_COMMUNITY): Payer: Self-pay | Admitting: Emergency Medicine

## 2018-10-26 ENCOUNTER — Other Ambulatory Visit: Payer: Self-pay

## 2018-10-26 ENCOUNTER — Emergency Department (HOSPITAL_COMMUNITY)
Admission: EM | Admit: 2018-10-26 | Discharge: 2018-10-26 | Disposition: A | Discharge status: Home / Self Care | Payer: Medicaid Other | Attending: Emergency Medicine | Admitting: Emergency Medicine

## 2018-10-26 ENCOUNTER — Emergency Department (HOSPITAL_COMMUNITY): Payer: Medicaid Other

## 2018-10-26 DIAGNOSIS — R030 Elevated blood-pressure reading, without diagnosis of hypertension: Secondary | ICD-10-CM

## 2018-10-26 DIAGNOSIS — I110 Hypertensive heart disease with heart failure: Secondary | ICD-10-CM | POA: Diagnosis not present

## 2018-10-26 DIAGNOSIS — I5042 Chronic combined systolic (congestive) and diastolic (congestive) heart failure: Secondary | ICD-10-CM | POA: Insufficient documentation

## 2018-10-26 DIAGNOSIS — Z7982 Long term (current) use of aspirin: Secondary | ICD-10-CM | POA: Diagnosis not present

## 2018-10-26 DIAGNOSIS — R600 Localized edema: Secondary | ICD-10-CM | POA: Insufficient documentation

## 2018-10-26 DIAGNOSIS — Z794 Long term (current) use of insulin: Secondary | ICD-10-CM | POA: Insufficient documentation

## 2018-10-26 DIAGNOSIS — E119 Type 2 diabetes mellitus without complications: Secondary | ICD-10-CM | POA: Diagnosis not present

## 2018-10-26 DIAGNOSIS — Z79899 Other long term (current) drug therapy: Secondary | ICD-10-CM | POA: Insufficient documentation

## 2018-10-26 DIAGNOSIS — F17210 Nicotine dependence, cigarettes, uncomplicated: Secondary | ICD-10-CM | POA: Insufficient documentation

## 2018-10-26 DIAGNOSIS — R079 Chest pain, unspecified: Secondary | ICD-10-CM | POA: Diagnosis present

## 2018-10-26 LAB — CBG MONITORING, ED: Glucose-Capillary: 259 mg/dL — ABNORMAL HIGH (ref 70–99)

## 2018-10-26 LAB — BASIC METABOLIC PANEL
Anion gap: 11 (ref 5–15)
BUN: 28 mg/dL — ABNORMAL HIGH (ref 6–20)
CO2: 22 mmol/L (ref 22–32)
Calcium: 8.9 mg/dL (ref 8.9–10.3)
Chloride: 105 mmol/L (ref 98–111)
Creatinine, Ser: 1.26 mg/dL — ABNORMAL HIGH (ref 0.61–1.24)
GFR calc Af Amer: >60 mL/min (ref >60)
GFR calc non Af Amer: >60 mL/min (ref >60)
Glucose, Bld: 289 mg/dL — ABNORMAL HIGH (ref 70–99)
Potassium: 3.7 mmol/L (ref 3.5–5.1)
Sodium: 138 mmol/L (ref 135–145)

## 2018-10-26 MED ORDER — FUROSEMIDE 20 MG PO TABS
40.0000 mg | ORAL_TABLET | Freq: Once | ORAL | Status: AC
  Administered 2018-10-26: 40 mg via ORAL
  Filled 2018-10-26: qty 2

## 2018-10-26 MED ORDER — POTASSIUM CHLORIDE CRYS ER 20 MEQ PO TBCR
20.0000 meq | EXTENDED_RELEASE_TABLET | Freq: Once | ORAL | Status: AC
  Administered 2018-10-26: 20 meq via ORAL
  Filled 2018-10-26: qty 1

## 2018-10-26 MED ORDER — LISINOPRIL 10 MG PO TABS
10.0000 mg | ORAL_TABLET | Freq: Once | ORAL | Status: AC
  Administered 2018-10-26: 10 mg via ORAL
  Filled 2018-10-26: qty 1

## 2018-10-26 MED ORDER — CLOTRIMAZOLE 1 % EX CREA
TOPICAL_CREAM | Freq: Two times a day (BID) | CUTANEOUS | Status: DC
  Administered 2018-10-26: 1 via TOPICAL
  Filled 2018-10-26: qty 15

## 2018-10-26 NOTE — ED Triage Notes (Signed)
[facility]. Pt c/o leg swelling, chest pain about 2hrs ago. Cocaine use tonight. V/S -- 180/98  HR96  RR20  SpO2 98%RA  CBG 426

## 2018-10-26 NOTE — Discharge Instructions (Addendum)
Please restart your Lasix daily.  Come back to the emergency department for any chest pain, shortness of breath, or any worsening leg swelling, or new or worsening symptoms.

## 2018-10-26 NOTE — ED Notes (Signed)
Pt discharged and given discharge instructions. Pt verbalizes understanding.

## 2018-10-26 NOTE — ED Provider Notes (Signed)
[facility] EMERGENCY DEPARTMENT Provider Note   CSN: [id number] Arrival date & time: 10/26/18  0258    History   Chief Complaint Chief Complaint  Patient presents with  . Chest Pain  . Leg Swelling    HPI [name] is a 58 y.o. male.     HPI  Patient is a 57-year male with a past medical history of combined systolic and diastolic heart failure, cocaine use, schizophrenia, hypertension, type 2 diabetes mellitus presenting for leg edema and pain.  Patient reports that when EMS was initially called he was having some chest pain, however this is resolved.  Reports that he has chronic shortness of breath that he is unsure is worse today.  He is also complaining of penile edema.  He denies any difficulty urinating secondary to this.  Patient denies any fevers, chills, increasing erythema of lower extremities, or any other new symptoms today.  Past Medical History:  Diagnosis Date  . Chronic foot pain   . Cocaine abuse (HCC)   . Diabetes mellitus without complication (HCC)   . Hepatitis C    unsure   . Homelessness   . Hypertension   . Neuropathy   . Polysubstance abuse (HCC)   . Schizophrenia (HCC)   . Sleep apnea   . Systolic and diastolic CHF, chronic [facility])     Patient Active Problem List   Diagnosis Date Noted  . Elevated troponin 10/18/2018  . HLD (hyperlipidemia) 10/18/2018  . Anxiety 10/18/2018  . CHF (congestive heart failure), NYHA class II, acute on chronic, combined (HCC) 10/18/2018  . Hypertensive urgency 10/12/2018  . Mild renal insufficiency 10/12/2018  . Cellulitis 10/12/2018  . Penile cellulitis   . Adjustment disorder with mixed disturbance of emotions and conduct   . Sleep apnea 10/03/2018  . Hypokalemia 09/17/2018  . Scrotal swelling 09/17/2018  . Urinary hesitancy   . Constipation   . Acute on chronic combined systolic and diastolic CHF (congestive heart failure) (HCC) 08/25/2018  . Acute respiratory failure with  hypoxia (HCC) 08/25/2018  . Acute on chronic combined systolic and diastolic congestive heart failure (HCC) 08/18/2018  . Tobacco use 08/18/2018  . 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 Homelessness 08/08/2018  . Smoker 08/08/2018  . Acute exacerbation of CHF (congestive heart failure) (HCC) 03/28/2018  . Prostate enlargement 03/16/2018  . Aortic atherosclerosis (HCC) 03/16/2018  . 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 Chronic foot pain   . Schizoaffective disorder, bipolar type (HCC) 09/30/2016  . Substance induced mood disorder (HCC) 03/13/2015  . Acute kidney failure (HCC) 01/26/2015  . Schizophrenia, paranoid type (HCC) 01/17/2015  . Drug hallucinosis (HCC) 10/08/2014  . Chronic paranoid schizophrenia (HCC) 09/07/2014  . 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 Urinary retention   . Cocaine use disorder, severe, dependence (HCC)   . Essential hypertension, benign 03/28/2013  . 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 MULTIPLE TOOTH EXTRACTIONS          Home Medications    Prior to Admission medications   Medication Sig Start Date End Date Taking? Authorizing Provider  ARIPiprazole (ABILIFY) 10 MG tablet Take 1 tablet (10 mg total) by mouth daily. 09/18/18   Meredeth Ide, MD  aspirin EC 81 MG tablet Take 1 tablet (81 mg total) by mouth  daily. 10/19/18 02/16/19  Meredeth IdeLama, Gagan S, MD  atorvastatin (LIPITOR) 40 MG tablet Take 1 tablet (40 mg total) by mouth daily. 09/18/18   Meredeth IdeLama, Gagan S, MD  bisacodyl (DULCOLAX) 5 MG EC tablet Take 1 tablet (5 mg total) by mouth daily as needed for moderate constipation. 09/04/18   Rodolph Bonghompson, Daniel V, MD  carbamazepine (TEGRETOL) 200 MG tablet Take 0.5 tablets (100 mg total) by mouth 2 (two) times daily. 09/18/18    Meredeth IdeLama, Gagan S, MD  carvedilol (COREG) 6.25 MG tablet Take 1 tablet (6.25 mg total) by mouth 2 (two) times daily with a meal. 10/19/18   Sharl MaLama, Sarina IllGagan S, MD  cephALEXin (KEFLEX) 500 MG capsule Take 1 capsule (500 mg total) by mouth 4 (four) times daily. 10/22/18   Roxy HorsemanBrowning, Robert, PA-C  furosemide (LASIX) 40 MG tablet Take 1 tablet (40 mg total) by mouth daily. 09/24/18   Derwood KaplanNanavati, Ankit, MD  hydrocortisone (PROCTO-PAK) 1 % CREA Apply twice a day to hemorrhoids. 09/29/18   Renne CriglerGeiple, Joshua, PA-C  Insulin Glargine (LANTUS) 100 UNIT/ML Solostar Pen Inject 14 Units into the skin daily. 09/18/18   Meredeth IdeLama, Gagan S, MD  Insulin Pen Needle 29G X 8MM MISC Use with insulin 09/18/18   Meredeth IdeLama, Gagan S, MD  lisinopril (PRINIVIL,ZESTRIL) 10 MG tablet Take 1 tablet (10 mg total) by mouth daily. 09/18/18   Meredeth IdeLama, Gagan S, MD  nicotine (NICODERM CQ - DOSED IN MG/24 HOURS) 21 mg/24hr patch Place 1 patch (21 mg total) onto the skin daily. 09/04/18   Rodolph Bonghompson, Daniel V, MD  polyethylene glycol [facility](MIRALAX / Ethelene HalGLYCOLAX) packet Take 17 g by mouth 2 (two) times daily. 09/18/18   Meredeth IdeLama, Gagan S, MD  Potassium Chloride ER 20 MEQ TBCR Take 20 mEq by mouth daily. 09/18/18   Meredeth IdeLama, Gagan S, MD  senna-docusate (SENOKOT-S) 8.6-50 MG tablet Take 1 tablet by mouth 2 (two) times daily. 09/04/18   Rodolph Bonghompson, Daniel V, MD  sulfamethoxazole-trimethoprim (BACTRIM DS) 800-160 MG tablet Take 1 tablet by mouth 2 (two) times daily for 7 days. 10/22/18 10/29/18  Roxy HorsemanBrowning, Robert, PA-C  tamsulosin (FLOMAX) 0.4 MG CAPS capsule Take 1 capsule (0.4 mg total) by mouth daily after breakfast. 10/05/18   Narda BondsNettey, Ralph A, MD    Family History Family History  Problem Relation Age of Onset  . Hypertension Other   . Diabetes Other     Social History Social History   Tobacco Use  . Smoking status: Current Every Day Smoker    Packs/day: 1.00    Years: 20.00    Pack years: 20.00    Types: Cigarettes  . Smokeless tobacco: Current User  Substance Use Topics  . Alcohol  use: Yes    Comment: Daily Drinker   . Drug use: Yes    Frequency: 7.0 times per week    Types: "Crack" cocaine, Cocaine, Marijuana     Allergies   Haldol [haloperidol]   Review of Systems Review of Systems  Constitutional: Negative for chills and fever.  Eyes: Negative for visual disturbance.  Respiratory: Negative for cough, chest tightness and shortness of breath.   Cardiovascular: Positive for leg swelling. Negative for chest pain and palpitations.  Gastrointestinal: Negative for abdominal pain, nausea and vomiting.  Genitourinary: Positive for penile swelling. Negative for dysuria and flank pain.  Musculoskeletal: Negative for back pain and myalgias.  Skin: Negative for rash.  Neurological: Negative for dizziness and syncope.  All other systems reviewed and are negative.  Physical Exam Updated Vital Signs BP (!) 174/108  Pulse 96   Temp 98.3 F (36.8 C) (Oral)   Resp 17   Ht  (1.753 m)   Wt 97 kg   SpO2 96%   BMI 31.58 kg/m   Physical Exam Vitals signs and nursing note reviewed.  Constitutional:      General: He is not in acute distress.    Appearance: He is well-developed.  HENT:     Head: Normocephalic and atraumatic.  Eyes:     Conjunctiva/sclera: Conjunctivae normal.     Pupils: Pupils are equal, round, and reactive to light.  Neck:     Musculoskeletal: Normal range of motion and neck supple.  Cardiovascular:     Rate and Rhythm: Normal rate and regular rhythm.     Heart sounds: S1 normal and S2 normal. No murmur.     Comments: Patient has 2+ pitting LE edema. Not significantly increased from baseline.  Pulmonary:     Effort: Pulmonary effort is normal.     Breath sounds: Examination of the right-lower field reveals rales. Examination of the left-lower field reveals rales. Rales present. No wheezing.  Abdominal:     General: There is no distension.     Palpations: Abdomen is soft.     Tenderness: There is no abdominal tenderness. There is no  guarding.  Genitourinary:    Comments: GU exam performed with RN chaperone present.  Patient has minimal edema bilateral testes.  Penile shaft is non-erythematous, but slightly edematous.  No purulent drainage. Musculoskeletal: Normal range of motion.        General: No deformity.  Lymphadenopathy:     Cervical: No cervical adenopathy.  Skin:    General: Skin is warm and dry.     Findings: No erythema or rash.  Neurological:     Mental Status: He is alert.     Comments: Cranial nerves grossly intact. Patient moves extremities symmetrically and with good coordination.  Psychiatric:        Behavior: Behavior normal.        Thought Content: Thought content normal.        Judgment: Judgment normal.      ED Treatments / Results  Labs (all labs ordered are listed, but only abnormal results are displayed) Labs Reviewed  BASIC METABOLIC PANEL - Abnormal; Notable for the following components:      Result Value   Glucose, Bld 289 (*)    BUN 28 (*)    Creatinine, Ser 1.26 (*)    All other components within normal limits  CBG MONITORING, ED - Abnormal; Notable for the following components:   Glucose-Capillary 259 (*)    All other components within normal limits    EKG EKG Interpretation  Date/Time:  Monday October 26 2018 03:08:13 EDT Ventricular Rate:  92 PR Interval:    QRS Duration: 86 QT Interval:  373 QTC Calculation: 462 R Axis:   73 Text Interpretation:  Sinus rhythm Atrial premature complexes Anterior infarct, old No acute changes Confirmed by Marily Memos [phone]) on 10/26/2018 4:10:34 AM   Radiology No results found.  Procedures Procedures (including critical care time)  Medications Ordered in ED Medications  clotrimazole (LOTRIMIN) 1 % cream (1 application Topical Given 10/26/18 0357)  furosemide (LASIX) tablet 40 mg (has no administration in time range)  potassium chloride SA (K-DUR) CR tablet 20 mEq (has no administration in time range)  lisinopril (ZESTRIL)  tablet 10 mg (has no administration in time range)     Initial Impression / Assessment and  Plan / ED Course  I have reviewed the triage vital signs and the nursing notes.  Pertinent labs & imaging results that were available during my care of the patient were reviewed by me and considered in my medical decision making (see chart for details).  Clinical Course as of Oct 25 712  Mon Oct 26, 2018  0418 Overall at baseline.   Creatinine(!): 1.26 [AM]  0712 Stable chest xray.   DG Chest Portable 1 View [AM]    Clinical Course User Index [AM] Delia Chimes       This is a 58 year old male with several chronic medical problems including heart failure with reduced ejection fraction, hypertension, cocaine use, and 57 visits in 6 months presenting for several chronic issues.  Initially, he told EMS he was having chest pain but on arrival states that he is not having chest pain.  He reports his primary complaint is lower extremity edema.  He is also complaining of penile swelling.  Examination shows no significant edema or erythema of penile shaft compared with prior visits and images documented in chart.  Appears to be improving.  Appears he may be a balanitis, and will represcribed clotrimazole cream.  Renal function normal today.  Fluid status appears at baseline, and patient given home Lasix.  Blood pressure was also elevated here in the emergency department and patient was given his home lisinopril.  Carvedilol held due to report of cocaine use last night.  Patient did have some borderline hypoxic readings while sleeping.  These resolved immediately upon awakening.  His chest x-ray shows stable cardiomegaly and pulmonary vascular congestion.  Do not feel the patient is decompensated from a acute heart failure standpoint today.  Patient was given return precautions for any chest pain, shortness of breath, or new or worsening symptoms.  Patient is in understanding and agrees with the plan  of care.  This is a supervised visit with Dr. Marily Memos. Evaluation, management, and discharge planning discussed with this attending physician.  Final Clinical Impressions(s) / ED Diagnoses   Final diagnoses:  Leg edema  Elevated blood pressure reading    ED Discharge Orders    None       Delia Chimes 10/26/18 0720    Mesner, Barbara Cower, MD 10/26/18 [phone]

## 2018-10-28 ENCOUNTER — Emergency Department (HOSPITAL_COMMUNITY)
Admission: EM | Admit: 2018-10-28 | Discharge: 2018-10-28 | Disposition: A | Discharge status: Home / Self Care | Payer: Medicaid Other | Source: Home / Self Care | Attending: Emergency Medicine | Admitting: Emergency Medicine

## 2018-10-28 ENCOUNTER — Emergency Department (HOSPITAL_COMMUNITY)
Admission: EM | Admit: 2018-10-28 | Discharge: 2018-10-28 | Payer: Medicaid Other | Attending: Emergency Medicine | Admitting: Emergency Medicine

## 2018-10-28 ENCOUNTER — Encounter (HOSPITAL_COMMUNITY): Payer: Self-pay | Admitting: Emergency Medicine

## 2018-10-28 DIAGNOSIS — F209 Schizophrenia, unspecified: Secondary | ICD-10-CM | POA: Insufficient documentation

## 2018-10-28 DIAGNOSIS — F1990 Other psychoactive substance use, unspecified, uncomplicated: Secondary | ICD-10-CM | POA: Diagnosis not present

## 2018-10-28 DIAGNOSIS — I5042 Chronic combined systolic (congestive) and diastolic (congestive) heart failure: Secondary | ICD-10-CM | POA: Insufficient documentation

## 2018-10-28 DIAGNOSIS — Z794 Long term (current) use of insulin: Secondary | ICD-10-CM | POA: Insufficient documentation

## 2018-10-28 DIAGNOSIS — Z79899 Other long term (current) drug therapy: Secondary | ICD-10-CM | POA: Diagnosis not present

## 2018-10-28 DIAGNOSIS — I10 Essential (primary) hypertension: Secondary | ICD-10-CM | POA: Insufficient documentation

## 2018-10-28 DIAGNOSIS — N5089 Other specified disorders of the male genital organs: Secondary | ICD-10-CM | POA: Diagnosis present

## 2018-10-28 DIAGNOSIS — R1030 Lower abdominal pain, unspecified: Secondary | ICD-10-CM

## 2018-10-28 DIAGNOSIS — R609 Edema, unspecified: Secondary | ICD-10-CM | POA: Diagnosis not present

## 2018-10-28 DIAGNOSIS — R109 Unspecified abdominal pain: Secondary | ICD-10-CM | POA: Diagnosis not present

## 2018-10-28 DIAGNOSIS — F17210 Nicotine dependence, cigarettes, uncomplicated: Secondary | ICD-10-CM | POA: Diagnosis not present

## 2018-10-28 DIAGNOSIS — R6889 Other general symptoms and signs: Secondary | ICD-10-CM

## 2018-10-28 DIAGNOSIS — E119 Type 2 diabetes mellitus without complications: Secondary | ICD-10-CM | POA: Diagnosis not present

## 2018-10-28 DIAGNOSIS — Z7982 Long term (current) use of aspirin: Secondary | ICD-10-CM | POA: Diagnosis not present

## 2018-10-28 NOTE — ED Notes (Signed)
ED Provider at bedside. 

## 2018-10-28 NOTE — ED Triage Notes (Signed)
Pt BIB [facility] for swollen scrotum and multiple complaints.

## 2018-10-28 NOTE — ED Provider Notes (Signed)
[facility] EMERGENCY DEPARTMENT Provider Note   CSN: [id number] Arrival date & time: 10/28/18  0608    History   Chief Complaint Chief Complaint  Patient presents with  . Groin Pain    HPI [name] is a 58 y.o. male.     HPI   Patient with history of chronic pain, drug abuse, diabetes, CHF, chronic edema, schizophrenia presents for his 55th ER visit in 6 months.  Apparently he called EMS because he has a swollen scrotum and multiple other complaints.  Patient reports his leg swelling is worse. He is requesting to be admitted and is requesting food  Past Medical History:  Diagnosis Date  . Chronic foot pain   . Cocaine abuse (HCC)   . Diabetes mellitus without complication (HCC)   . Hepatitis C    unsure   . Homelessness   . Hypertension   . Neuropathy   . Polysubstance abuse (HCC)   . Schizophrenia (HCC)   . Sleep apnea   . Systolic and diastolic CHF, chronic [facility])     Patient Active Problem List   Diagnosis Date Noted  . Elevated troponin 10/18/2018  . HLD (hyperlipidemia) 10/18/2018  . Anxiety 10/18/2018  . CHF (congestive heart failure), NYHA class II, acute on chronic, combined (HCC) 10/18/2018  . Hypertensive urgency 10/12/2018  . Mild renal insufficiency 10/12/2018  . Cellulitis 10/12/2018  . Penile cellulitis   . Adjustment disorder with mixed disturbance of emotions and conduct   . Sleep apnea 10/03/2018  . Hypokalemia 09/17/2018  . Scrotal swelling 09/17/2018  . Urinary hesitancy   . Constipation   . Acute on chronic combined systolic and diastolic CHF (congestive heart failure) (HCC) 08/25/2018  . Acute respiratory failure with hypoxia (HCC) 08/25/2018  . Acute on chronic combined systolic and diastolic congestive heart failure (HCC) 08/18/2018  . Tobacco use 08/18/2018  . 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 Homelessness 08/08/2018  . Smoker  08/08/2018  . Acute exacerbation of CHF (congestive heart failure) (HCC) 03/28/2018  . Prostate enlargement 03/16/2018  . Aortic atherosclerosis (HCC) 03/16/2018  . 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 Chronic foot pain   . Schizoaffective disorder, bipolar type (HCC) 09/30/2016  . Substance induced mood disorder (HCC) 03/13/2015  . Acute kidney failure (HCC) 01/26/2015  . Schizophrenia, paranoid type (HCC) 01/17/2015  . Drug hallucinosis (HCC) 10/08/2014  . Chronic paranoid schizophrenia (HCC) 09/07/2014  . 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 Urinary retention   . Cocaine use disorder, severe, dependence (HCC)   . Essential hypertension, benign 03/28/2013  . 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 MULTIPLE TOOTH EXTRACTIONS          Home Medications    Prior to Admission medications   Medication Sig Start Date End Date Taking? Authorizing Provider  ARIPiprazole (ABILIFY) 10 MG tablet Take 1 tablet (10 mg total) by mouth daily. 09/18/18   Meredeth Ide, MD  aspirin EC 81 MG tablet Take 1 tablet (81 mg total) by mouth daily. 10/19/18 02/16/19  Meredeth Ide, MD  atorvastatin (LIPITOR) 40 MG tablet Take 1 tablet (40 mg total) by mouth daily. 09/18/18   Meredeth Ide, MD  bisacodyl (DULCOLAX) 5 MG EC tablet Take 1 tablet (5 mg total) by mouth daily as needed  for moderate constipation. 09/04/18   Rodolph Bonghompson, Daniel V, MD  carbamazepine (TEGRETOL) 200 MG tablet Take 0.5 tablets (100 mg total) by mouth 2 (two) times daily. 09/18/18   Meredeth IdeLama, Gagan S, MD  carvedilol (COREG) 6.25 MG tablet Take 1 tablet (6.25 mg total) by mouth 2 (two) times daily with a meal. 10/19/18   Sharl MaLama, Sarina IllGagan S, MD  cephALEXin (KEFLEX) 500 MG capsule Take 1 capsule (500 mg total) by mouth 4 (four) times daily. 10/22/18   Roxy HorsemanBrowning, Robert, PA-C   furosemide (LASIX) 40 MG tablet Take 1 tablet (40 mg total) by mouth daily. 09/24/18   Derwood KaplanNanavati, Ankit, MD  hydrocortisone (PROCTO-PAK) 1 % CREA Apply twice a day to hemorrhoids. 09/29/18   Renne CriglerGeiple, Joshua, PA-C  Insulin Glargine (LANTUS) 100 UNIT/ML Solostar Pen Inject 14 Units into the skin daily. 09/18/18   Meredeth IdeLama, Gagan S, MD  Insulin Pen Needle 29G X 8MM MISC Use with insulin 09/18/18   Meredeth IdeLama, Gagan S, MD  lisinopril (PRINIVIL,ZESTRIL) 10 MG tablet Take 1 tablet (10 mg total) by mouth daily. 09/18/18   Meredeth IdeLama, Gagan S, MD  nicotine (NICODERM CQ - DOSED IN MG/24 HOURS) 21 mg/24hr patch Place 1 patch (21 mg total) onto the skin daily. 09/04/18   Rodolph Bonghompson, Daniel V, MD  polyethylene glycol [facility](MIRALAX / Ethelene HalGLYCOLAX) packet Take 17 g by mouth 2 (two) times daily. 09/18/18   Meredeth IdeLama, Gagan S, MD  Potassium Chloride ER 20 MEQ TBCR Take 20 mEq by mouth daily. 09/18/18   Meredeth IdeLama, Gagan S, MD  senna-docusate (SENOKOT-S) 8.6-50 MG tablet Take 1 tablet by mouth 2 (two) times daily. 09/04/18   Rodolph Bonghompson, Daniel V, MD  sulfamethoxazole-trimethoprim (BACTRIM DS) 800-160 MG tablet Take 1 tablet by mouth 2 (two) times daily for 7 days. 10/22/18 10/29/18  Roxy HorsemanBrowning, Robert, PA-C  tamsulosin (FLOMAX) 0.4 MG CAPS capsule Take 1 capsule (0.4 mg total) by mouth daily after breakfast. 10/05/18   Narda BondsNettey, Ralph A, MD    Family History Family History  Problem Relation Age of Onset  . Hypertension Other   . Diabetes Other     Social History Social History   Tobacco Use  . Smoking status: Current Every Day Smoker    Packs/day: 1.00    Years: 20.00    Pack years: 20.00    Types: Cigarettes  . Smokeless tobacco: Current User  Substance Use Topics  . Alcohol use: Yes    Comment: Daily Drinker   . Drug use: Yes    Frequency: 7.0 times per week    Types: "Crack" cocaine, Cocaine, Marijuana     Allergies   Haldol [haloperidol]   Review of Systems Review of Systems  Cardiovascular: Positive for leg swelling.  Psychiatric/Behavioral:  The patient is nervous/anxious.      Physical Exam Updated Vital Signs BP (!) 161/100   Pulse 98   Temp 97.9 F (36.6 C)   Resp 18   Ht 1.753 m (5\' 9" )   Wt 97 kg   SpO2 100%   BMI 31.58 kg/m   Physical Exam CONSTITUTIONAL: Disheveled and anxious HEAD: Normocephalic/atraumatic EYES: EOMI ENMT: Mucous membranes moist NECK: supple no meningeal signs CV: S1/S2 noted LUNGS: Lungs are clear to auscultation bilaterally, no apparent distress ABDOMEN: soft, nontender, no rebound or guarding, bowel sounds noted throughout abdomen GU: Deferred NEURO: Pt is awake/alert/appropriate, moves all extremitiesx4.   EXTREMITIES: pulses normal/equal, full ROM, chronic lower extremity edema noted without signs of cellulitis. SKIN: warm, color normal PSYCH: Anxious  ED Treatments / Results  Labs (all labs ordered are listed, but only abnormal results are displayed) Labs Reviewed - No data to display  EKG None  Radiology No results found.  Procedures Procedures ( Medications Ordered in ED Medications - No data to display   Initial Impression / Assessment and Plan / ED Course  I have reviewed the triage vital signs and the nursing notes.      Patient presents for his 55th ER visit in 6 months.  Patient had multiple complaints, many of which are chronic including lower extremity edema as well as chronic groin swelling from his chronic CHF I have seen this patient before and his exam is unchanged.  He is in no respiratory distress his lung sounds were clear.  His oxygen level was appropriate.  Due to previous ED visits, suspicion for chronic process, no further testing was ordered  When I advised patient he will be discharged he became violently angry, verbally aggressive.  He told the nurse that he would "take a felony" and then began cursing at me as well as the police  Patient escorted out with police  Final Clinical Impressions(s) / ED Diagnoses   Final diagnoses:   Inguinal pain, unspecified laterality    ED Discharge Orders    None       Zadie Rhine, MD 10/28/18 [phone]

## 2018-10-28 NOTE — Discharge Instructions (Addendum)
[facility], or a primary care provider for your chronic issues.

## 2018-10-28 NOTE — ED Provider Notes (Signed)
[facility] EMERGENCY DEPARTMENT Provider Note   CSN: [id number] Arrival date & time: 10/28/18  4098    History   Chief Complaint No chief complaint on file.   HPI [name] is a 58 y.o. male.     HPI    58 year old male with history of chronic pain, drug abuse, diabetes, CHF, chronic edema, schizophrenia presents today with numerous complaints.  Patient notes chronic swelling to his scrotum and penis.  He notes this is unchanged.     Past Medical History:  Diagnosis Date  . Chronic foot pain   . Cocaine abuse (HCC)   . Diabetes mellitus without complication (HCC)   . Hepatitis C    unsure   . Homelessness   . Hypertension   . Neuropathy   . Polysubstance abuse (HCC)   . Schizophrenia (HCC)   . Sleep apnea   . Systolic and diastolic CHF, chronic [facility])     Patient Active Problem List   Diagnosis Date Noted  . Elevated troponin 10/18/2018  . HLD (hyperlipidemia) 10/18/2018  . Anxiety 10/18/2018  . CHF (congestive heart failure), NYHA class II, acute on chronic, combined (HCC) 10/18/2018  . Hypertensive urgency 10/12/2018  . Mild renal insufficiency 10/12/2018  . Cellulitis 10/12/2018  . Penile cellulitis   . Adjustment disorder with mixed disturbance of emotions and conduct   . Sleep apnea 10/03/2018  . Hypokalemia 09/17/2018  . Scrotal swelling 09/17/2018  . Urinary hesitancy   . Constipation   . Acute on chronic combined systolic and diastolic CHF (congestive heart failure) (HCC) 08/25/2018  . Acute respiratory failure with hypoxia (HCC) 08/25/2018  . Acute on chronic combined systolic and diastolic congestive heart failure (HCC) 08/18/2018  . Tobacco use 08/18/2018  . 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 Homelessness 08/08/2018  . Smoker 08/08/2018  . Acute exacerbation of CHF (congestive heart failure) (HCC) 03/28/2018  . Prostate enlargement  03/16/2018  . Aortic atherosclerosis (HCC) 03/16/2018  . 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 Chronic foot pain   . Schizoaffective disorder, bipolar type (HCC) 09/30/2016  . Substance induced mood disorder (HCC) 03/13/2015  . Acute kidney failure (HCC) 01/26/2015  . Schizophrenia, paranoid type (HCC) 01/17/2015  . Drug hallucinosis (HCC) 10/08/2014  . Chronic paranoid schizophrenia (HCC) 09/07/2014  . 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 Urinary retention   . Cocaine use disorder, severe, dependence (HCC)   . Essential hypertension, benign 03/28/2013  . 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 MULTIPLE TOOTH EXTRACTIONS          Home Medications    Prior to Admission medications   Medication Sig Start Date End Date Taking? Authorizing Provider  ARIPiprazole (ABILIFY) 10 MG tablet Take 1 tablet (10 mg total) by mouth daily. 09/18/18   Meredeth Ide, MD  aspirin EC 81 MG tablet Take 1 tablet (81 mg total) by mouth daily. 10/19/18 02/16/19  Meredeth Ide, MD  atorvastatin (LIPITOR) 40 MG tablet Take 1 tablet (40 mg total) by mouth daily. 09/18/18   Meredeth Ide, MD  bisacodyl (DULCOLAX) 5 MG EC tablet Take 1 tablet (5 mg total) by mouth daily as needed for moderate constipation. 09/04/18   Rodolph Bong, MD  carbamazepine (TEGRETOL) 200 MG tablet Take 0.5 tablets (100 mg total)  by mouth 2 (two) times daily. 09/18/18   Meredeth Ide, MD  carvedilol (COREG) 6.25 MG tablet Take 1 tablet (6.25 mg total) by mouth 2 (two) times daily with a meal. 10/19/18   Sharl Ma, Sarina Ill, MD  cephALEXin (KEFLEX) 500 MG capsule Take 1 capsule (500 mg total) by mouth 4 (four) times daily. 10/22/18   Roxy Horseman, PA-C  furosemide (LASIX) 40 MG tablet Take 1 tablet (40 mg total) by mouth daily. 09/24/18   Derwood Kaplan, MD   hydrocortisone (PROCTO-PAK) 1 % CREA Apply twice a day to hemorrhoids. 09/29/18   Renne Crigler, PA-C  Insulin Glargine (LANTUS) 100 UNIT/ML Solostar Pen Inject 14 Units into the skin daily. 09/18/18   Meredeth Ide, MD  Insulin Pen Needle 29G X MISC Use with insulin 09/18/18   Meredeth Ide, MD  lisinopril (PRINIVIL,ZESTRIL) 10 MG tablet Take 1 tablet (10 mg total) by mouth daily. 09/18/18   Meredeth Ide, MD  nicotine (NICODERM CQ - DOSED IN MG/24 HOURS) 21 mg/24hr patch Place 1 patch (21 mg total) onto the skin daily. 09/04/18   Rodolph Bong, MD  polyethylene glycol [facility] / Ethelene Hal) packet Take 17 g by mouth 2 (two) times daily. 09/18/18   Meredeth Ide, MD  Potassium Chloride ER 20 MEQ TBCR Take 20 mEq by mouth daily. 09/18/18   Meredeth Ide, MD  senna-docusate (SENOKOT-S) 8.6-50 MG tablet Take 1 tablet by mouth 2 (two) times daily. 09/04/18   Rodolph Bong, MD  sulfamethoxazole-trimethoprim (BACTRIM DS) 800-160 MG tablet Take 1 tablet by mouth 2 (two) times daily for 7 days. 10/22/18 10/29/18  Roxy Horseman, PA-C  tamsulosin (FLOMAX) 0.4 MG CAPS capsule Take 1 capsule (0.4 mg total) by mouth daily after breakfast. 10/05/18   Narda Bonds, MD    Family History Family History  Problem Relation Age of Onset  . Hypertension Other   . Diabetes Other     Social History Social History   Tobacco Use  . Smoking status: Current Every Day Smoker    Packs/day: 1.00    Years: 20.00    Pack years: 20.00    Types: Cigarettes  . Smokeless tobacco: Current User  Substance Use Topics  . Alcohol use: Yes    Comment: Daily Drinker   . Drug use: Yes    Frequency: 7.0 times per week    Types: "Crack" cocaine, Cocaine, Marijuana     Allergies   Haldol [haloperidol]   Review of Systems Review of Systems  All other systems reviewed and are negative.   Physical Exam Updated Vital Signs BP (!) 168/112 (BP Location: Right Arm)   Pulse (!) 108   Temp 97.8 F (36.6 C) (Oral)    Resp 20   SpO2 94%   Physical Exam Vitals signs and nursing note reviewed.  Constitutional:      Appearance: He is well-developed.  HENT:     Head: Normocephalic and atraumatic.  Eyes:     General: No scleral icterus.       Right eye: No discharge.        Left eye: No discharge.     Conjunctiva/sclera: Conjunctivae normal.     Pupils: Pupils are equal, round, and reactive to light.  Neck:     Musculoskeletal: Normal range of motion.     Vascular: No JVD.     Trachea: No tracheal deviation.  Pulmonary:     Effort: Pulmonary effort is normal.  Breath sounds: No stridor.  Genitourinary:    Comments: Swelling noted to the penile shaft, no erythema no discharge Neurological:     Mental Status: He is alert and oriented to person, place, and time.     Coordination: Coordination normal.  Psychiatric:        Behavior: Behavior normal.        Thought Content: Thought content normal.        Judgment: Judgment normal.      ED Treatments / Results  Labs (all labs ordered are listed, but only abnormal results are displayed) Labs Reviewed - No data to display  EKG None  Radiology No results found.  Procedures Procedures (including critical care time)  Medications Ordered in ED Medications - No data to display   Initial Impression / Assessment and Plan / ED Course  I have reviewed the triage vital signs and the nursing notes.  Pertinent labs & imaging results that were available during my care of the patient were reviewed by me and considered in my medical decision making (see chart for details).        Labs:   Imaging:  Consults:  Therapeutics:  Discharge Meds:   Assessment/Plan: 58 year old male presents today for the 56 time in the last 6 months and twice today.  Patient with multiple complaints all of which are chronic.  I do not see any acute changes from his baseline per chart review.  Patient was discharged earlier in day and was violent and  verbally aggressive required the police officers to escort him out.  I do not feel he has any acute life-threatening etiologies that would require management at this time.  Patient is discharged with outpatient follow-up.    Final Clinical Impressions(s) / ED Diagnoses   Final diagnoses:  Multiple complaints    ED Discharge Orders    None       Rosalio LoudHedges, Zahrah Sutherlin, PA-C 10/28/18 [phone]    Gwyneth SproutPlunkett, Whitney, MD 11/01/18 1958

## 2018-10-28 NOTE — ED Notes (Signed)
Pt was discharged and became verbally aggressive. [employer] and security was called and escorted patient from the department

## 2018-10-28 NOTE — ED Triage Notes (Signed)
Pt back after being discharged 1 hr ago with multiple complaints including abd pain , hemorrhoids, etc

## 2018-10-30 ENCOUNTER — Other Ambulatory Visit: Payer: Self-pay

## 2018-10-30 ENCOUNTER — Emergency Department (HOSPITAL_COMMUNITY)
Admission: EM | Admit: 2018-10-30 | Discharge: 2018-10-31 | Disposition: A | Discharge status: Home / Self Care | Payer: Medicaid Other | Attending: Emergency Medicine | Admitting: Emergency Medicine

## 2018-10-30 DIAGNOSIS — Z590 Homelessness: Secondary | ICD-10-CM | POA: Diagnosis not present

## 2018-10-30 DIAGNOSIS — I5042 Chronic combined systolic (congestive) and diastolic (congestive) heart failure: Secondary | ICD-10-CM | POA: Insufficient documentation

## 2018-10-30 DIAGNOSIS — Z79899 Other long term (current) drug therapy: Secondary | ICD-10-CM | POA: Insufficient documentation

## 2018-10-30 DIAGNOSIS — R609 Edema, unspecified: Secondary | ICD-10-CM | POA: Insufficient documentation

## 2018-10-30 DIAGNOSIS — F17210 Nicotine dependence, cigarettes, uncomplicated: Secondary | ICD-10-CM | POA: Insufficient documentation

## 2018-10-30 DIAGNOSIS — E119 Type 2 diabetes mellitus without complications: Secondary | ICD-10-CM | POA: Insufficient documentation

## 2018-10-30 DIAGNOSIS — K6289 Other specified diseases of anus and rectum: Secondary | ICD-10-CM | POA: Insufficient documentation

## 2018-10-30 DIAGNOSIS — Z794 Long term (current) use of insulin: Secondary | ICD-10-CM | POA: Diagnosis not present

## 2018-10-30 DIAGNOSIS — I110 Hypertensive heart disease with heart failure: Secondary | ICD-10-CM | POA: Diagnosis not present

## 2018-10-30 NOTE — ED Notes (Signed)
Attemped to obtain vitals but pt was standing in front of sink naked taking a sponge bath.

## 2018-10-30 NOTE — ED Notes (Signed)
Bed: SW54 Expected date:  Expected time:  Means of arrival:  Comments: 89M leg swelling

## 2018-10-30 NOTE — ED Triage Notes (Signed)
Per EMS, pt found in parking lot with CC of leg swelling and rectal pain for unknown amount of time. CBG 386.   BP 172 92 HR 78 R 18

## 2018-10-31 MED ORDER — FUROSEMIDE 40 MG PO TABS: 40.0000 mg | ORAL_TABLET | Freq: Every day | ORAL | 3 refills | Status: DC

## 2018-10-31 NOTE — ED Notes (Signed)
Pt escorted out by security and [employer]  

## 2018-10-31 NOTE — ED Notes (Signed)
[employer] and security at bedside

## 2018-10-31 NOTE — ED Notes (Signed)
Attempted to discharge pt with security at bedside. Pt refuses to leave room or hospital property. Security called [employer].

## 2018-10-31 NOTE — ED Provider Notes (Signed)
[facility]-EMERGENCY DEPT Provider Note   CSN: [id number] Arrival date & time: 10/30/18  2327    History   Chief Complaint Chief Complaint  Patient presents with   Leg Swelling   Rectal Pain    HPI [name] is a 58 y.o. male.   The history is provided by the patient.  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  He is supposed to be on furosemide but he does not know if he still has any.  He denies chest pain.  Past Medical History:  Diagnosis Date   Chronic foot pain    Cocaine abuse (HCC)    Diabetes mellitus without complication (HCC)    Hepatitis C    unsure    Homelessness    Hypertension    Neuropathy    Polysubstance abuse (HCC)    Schizophrenia (HCC)    Sleep apnea    Systolic and diastolic CHF, chronic (HCC)     Patient Active Problem List   Diagnosis Date Noted   Elevated troponin 10/18/2018   HLD (hyperlipidemia) 10/18/2018   Anxiety 10/18/2018   CHF (congestive heart failure), NYHA class II, acute on chronic, combined (HCC) 10/18/2018   Hypertensive urgency 10/12/2018   Mild renal insufficiency 10/12/2018   Cellulitis 10/12/2018   Penile cellulitis    Adjustment disorder with mixed disturbance of emotions and conduct    Sleep apnea 10/03/2018   Hypokalemia 09/17/2018   Scrotal swelling 09/17/2018   Urinary hesitancy    Constipation    Acute on chronic combined systolic and diastolic CHF (congestive heart failure) (HCC) 08/25/2018   Acute respiratory failure with hypoxia (HCC) 08/25/2018   Acute on chronic combined systolic and diastolic congestive heart failure (HCC) 08/18/2018   Tobacco use 08/18/2018   Acute on chronic combined systolic (congestive) and diastolic (congestive) heart failure (HCC) 08/08/2018   Homelessness 08/08/2018    Smoker 08/08/2018   Acute exacerbation of CHF (congestive heart failure) (HCC) 03/28/2018   Prostate enlargement 03/16/2018   Aortic atherosclerosis (HCC) 03/16/2018   Aneurysm of abdominal aorta (HCC) 03/16/2018   Chronic foot pain    Schizoaffective disorder, bipolar type (HCC) 09/30/2016   Substance induced mood disorder (HCC) 03/13/2015   Acute kidney failure (HCC) 01/26/2015   Schizophrenia, paranoid type (HCC) 01/17/2015   Drug hallucinosis (HCC) 10/08/2014   Chronic paranoid schizophrenia (HCC) 09/07/2014   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 Authorizing Provider  ARIPiprazole (ABILIFY) 10 MG tablet Take 1 tablet (10 mg total) by mouth daily. 09/18/18   Meredeth IdeLama, Gagan S, MD  aspirin EC 81 MG tablet Take 1 tablet (81 mg total) by mouth daily. 10/19/18 02/16/19  Meredeth IdeLama, Gagan S, MD  atorvastatin (LIPITOR) 40 MG tablet Take 1 tablet (40 mg total) by mouth daily. 09/18/18   Meredeth IdeLama, Gagan S, MD  bisacodyl (DULCOLAX) 5 MG EC tablet Take 1 tablet (5 mg total) by mouth daily as needed  for moderate constipation. 09/04/18   Rodolph Bong, MD  carbamazepine (TEGRETOL) 200 MG tablet Take 0.5 tablets (100 mg total) by mouth 2 (two) times daily. 09/18/18   Meredeth Ide, MD  carvedilol (COREG) 6.25 MG tablet Take 1 tablet (6.25 mg total) by mouth 2 (two) times daily with a meal. 10/19/18   Sharl Ma, Sarina Ill, MD  cephALEXin (KEFLEX) 500 MG capsule Take 1 capsule (500 mg total) by mouth 4 (four) times daily. 10/22/18   Roxy Horseman, PA-C    furosemide (LASIX) 40 MG tablet Take 1 tablet (40 mg total) by mouth daily. 10/31/18   Dione Booze, MD  hydrocortisone (PROCTO-PAK) 1 % CREA Apply twice a day to hemorrhoids. 09/29/18   Renne Crigler, PA-C  Insulin Glargine (LANTUS) 100 UNIT/ML Solostar Pen Inject 14 Units into the skin daily. 09/18/18   Meredeth Ide, MD  Insulin Pen Needle 29G X MISC Use with insulin 09/18/18   Meredeth Ide, MD  lisinopril (PRINIVIL,ZESTRIL) 10 MG tablet Take 1 tablet (10 mg total) by mouth daily. 09/18/18   Meredeth Ide, MD  nicotine (NICODERM CQ - DOSED IN MG/24 HOURS) 21 mg/24hr patch Place 1 patch (21 mg total) onto the skin daily. 09/04/18   Rodolph Bong, MD  polyethylene glycol [facility] Hal) packet Take 17 g by mouth 2 (two) times daily. 09/18/18   Meredeth Ide, MD  Potassium Chloride ER 20 MEQ TBCR Take 20 mEq by mouth daily. 09/18/18   Meredeth Ide, MD  senna-docusate (SENOKOT-S) 8.6-50 MG tablet Take 1 tablet by mouth 2 (two) times daily. 09/04/18   Rodolph Bong, MD  tamsulosin [facility]) 0.4 MG CAPS capsule Take 1 capsule (0.4 mg total) by mouth daily after breakfast. 10/05/18   Narda Bonds, MD    Family History Family History  Problem Relation Age of Onset   Hypertension Other    Diabetes Other     Social History Social History   Tobacco Use   Smoking status: Current Every Day Smoker    Packs/day: 1.00    Years: 20.00    Pack years: 20.00    Types: Cigarettes   Smokeless tobacco: Current User  Substance Use Topics   Alcohol use: Yes    Comment: Daily Drinker    Drug use: Yes    Frequency: 7.0 times per week    Types: "Crack" cocaine, Cocaine, Marijuana     Allergies   Haldol [haloperidol]   Review of Systems Review of Systems  All other systems reviewed and are negative.    Physical Exam Updated Vital Signs BP (!) 131/92 (BP Location: Left Arm)    Pulse 100    Temp 98.3 F (36.8 C) (Oral)    Resp 16    Ht 5\' 11"  (1.803 m)    Wt 97.5 kg    SpO2  94%    BMI 29.99 kg/m   Physical Exam Vitals signs and nursing note reviewed.    58 year old male, resting comfortably and in no acute distress. Vital signs are significant for borderline elevated blood pressure. Oxygen saturation is 94%, which is normal. Head is normocephalic and atraumatic. PERRLA, EOMI. Oropharynx is clear. Neck is nontender and supple without adenopathy or JVD. Back is nontender and there is no CVA tenderness.  There is 1+ presacral edema. Lungs are clear without rales, wheezes, or rhonchi. Chest is nontender. Heart has regular rate and rhythm without murmur. Abdomen is soft, flat, nontender without masses  or hepatosplenomegaly and peristalsis is normoactive. Extremities have 3+ pretibial and pedal edema, full range of motion is present. Skin is warm and dry without rash. Neurologic: Mental status is normal, cranial nerves are intact, there are no motor or sensory deficits.  ED Treatments / Results   Procedures Procedures  Medications Ordered in ED Medications - No data to display   Initial Impression / Assessment and Plan / ED Course  I have reviewed the triage vital signs and the nursing notes.  Patient well-known to the emergency department with 57 ED visits in the last 6 months coming in for worsening of chronic leg edema.  Exam is benign.  He is maintaining adequate oxygen saturation on room air.  On review of old records, he received a prescription for a 1 month supply of furosemide on March 26.  If he were compliant with it, he would have run out.  He is discharged with a new prescription for furosemide, and outpatient resources to try to get established for medical care.  Final Clinical Impressions(s) / ED Diagnoses   Final diagnoses:  Peripheral edema    ED Discharge Orders         Ordered    furosemide (LASIX) 40 MG tablet  Daily     10/31/18 0006           Dione Booze, MD 10/31/18 [phone]

## 2018-10-31 NOTE — ED Notes (Addendum)
Pt given paper scrubs and socks. Pt threw away his clothes because they were soiled.

## 2018-11-04 ENCOUNTER — Emergency Department (HOSPITAL_COMMUNITY): Payer: Medicaid Other

## 2018-11-04 ENCOUNTER — Emergency Department (HOSPITAL_COMMUNITY)
Admission: EM | Admit: 2018-11-04 | Discharge: 2018-11-04 | Disposition: A | Discharge status: Home / Self Care | Payer: Medicaid Other | Attending: Emergency Medicine | Admitting: Emergency Medicine

## 2018-11-04 ENCOUNTER — Encounter (HOSPITAL_COMMUNITY): Payer: Self-pay | Admitting: Emergency Medicine

## 2018-11-04 DIAGNOSIS — R600 Localized edema: Secondary | ICD-10-CM | POA: Insufficient documentation

## 2018-11-04 DIAGNOSIS — E119 Type 2 diabetes mellitus without complications: Secondary | ICD-10-CM | POA: Insufficient documentation

## 2018-11-04 DIAGNOSIS — J811 Chronic pulmonary edema: Secondary | ICD-10-CM | POA: Insufficient documentation

## 2018-11-04 DIAGNOSIS — R0989 Other specified symptoms and signs involving the circulatory and respiratory systems: Secondary | ICD-10-CM

## 2018-11-04 DIAGNOSIS — Z590 Homelessness: Secondary | ICD-10-CM | POA: Insufficient documentation

## 2018-11-04 DIAGNOSIS — Z79899 Other long term (current) drug therapy: Secondary | ICD-10-CM | POA: Insufficient documentation

## 2018-11-04 DIAGNOSIS — I110 Hypertensive heart disease with heart failure: Secondary | ICD-10-CM | POA: Diagnosis not present

## 2018-11-04 DIAGNOSIS — F17200 Nicotine dependence, unspecified, uncomplicated: Secondary | ICD-10-CM | POA: Diagnosis not present

## 2018-11-04 DIAGNOSIS — Z794 Long term (current) use of insulin: Secondary | ICD-10-CM | POA: Insufficient documentation

## 2018-11-04 DIAGNOSIS — I5042 Chronic combined systolic (congestive) and diastolic (congestive) heart failure: Secondary | ICD-10-CM | POA: Insufficient documentation

## 2018-11-04 LAB — BASIC METABOLIC PANEL
Anion gap: 10 (ref 5–15)
BUN: 18 mg/dL (ref 6–20)
CO2: 26 mmol/L (ref 22–32)
Calcium: 8.5 mg/dL — ABNORMAL LOW (ref 8.9–10.3)
Chloride: 103 mmol/L (ref 98–111)
Creatinine, Ser: 1.09 mg/dL (ref 0.61–1.24)
GFR calc Af Amer: >60 mL/min (ref >60)
GFR calc non Af Amer: >60 mL/min (ref >60)
Glucose, Bld: 253 mg/dL — ABNORMAL HIGH (ref 70–99)
Potassium: 3.4 mmol/L — ABNORMAL LOW (ref 3.5–5.1)
Sodium: 139 mmol/L (ref 135–145)

## 2018-11-04 LAB — CBC WITH DIFFERENTIAL/PLATELET
Abs Immature Granulocytes: 0.02 10*3/uL (ref 0.00–0.07)
Basophils Absolute: 0 10*3/uL (ref 0.0–0.1)
Basophils Relative: 1 %
Eosinophils Absolute: 0.3 10*3/uL (ref 0.0–0.5)
Eosinophils Relative: 4 %
HCT: 32.6 % — ABNORMAL LOW (ref 39.0–52.0)
Hemoglobin: 9.8 g/dL — ABNORMAL LOW (ref 13.0–17.0)
Immature Granulocytes: 0 %
Lymphocytes Relative: 10 %
Lymphs Abs: 0.7 10*3/uL (ref 0.7–4.0)
MCH: 24.6 pg — ABNORMAL LOW (ref 26.0–34.0)
MCHC: 30.1 g/dL (ref 30.0–36.0)
MCV: 81.7 fL (ref 80.0–100.0)
Monocytes Absolute: 0.6 10*3/uL (ref 0.1–1.0)
Monocytes Relative: 9 %
Neutro Abs: 5.4 10*3/uL (ref 1.7–7.7)
Neutrophils Relative %: 76 %
Platelets: 268 10*3/uL (ref 150–400)
RBC: 3.99 MIL/uL — ABNORMAL LOW (ref 4.22–5.81)
RDW: 19.9 % — ABNORMAL HIGH (ref 11.5–15.5)
WBC: 7.1 10*3/uL (ref 4.0–10.5)
nRBC: 0 % (ref 0.0–0.2)

## 2018-11-04 LAB — BRAIN NATRIURETIC PEPTIDE: B Natriuretic Peptide: 997.9 pg/mL — ABNORMAL HIGH (ref 0.0–100.0)

## 2018-11-04 MED ORDER — FUROSEMIDE 10 MG/ML IJ SOLN
40.0000 mg | Freq: Once | INTRAMUSCULAR | Status: AC
  Administered 2018-11-04: 04:00:00 40 mg via INTRAMUSCULAR
  Filled 2018-11-04: qty 4

## 2018-11-04 MED ORDER — FUROSEMIDE 10 MG/ML IJ SOLN: 40.0000 mg | Freq: Once | INTRAMUSCULAR | Status: DC

## 2018-11-04 MED ORDER — ACETAMINOPHEN 325 MG PO TABS
325.0000 mg | ORAL_TABLET | Freq: Once | ORAL | Status: AC
  Administered 2018-11-04: 325 mg via ORAL
  Filled 2018-11-04: qty 1

## 2018-11-04 NOTE — ED Provider Notes (Signed)
[facility] EMERGENCY DEPARTMENT Provider Note   CSN: [id number] Arrival date & time: 11/04/18  0221    History   Chief Complaint Chief Complaint  Patient presents with  . Swelling    HPI [name] is a 58 y.o. male.     HPI  This is a 58 year old male with a history of diabetes, cocaine abuse, homelessness, schizophrenia who presents with lower extremity swelling and scrotal swelling.  Patient is well-known to the emergency department with 58 visits in the last 6 months.  He was most recently seen on Friday.  At that time he complained of lower extremity swelling and was given a refill of his Lasix.  Patient has been unable to get this filled.  He reports increasing lower extremity edema bilaterally and scrotal swelling.  He also reports increasing shortness of breath.  He denies chest pain, fever, cough.  Past Medical History:  Diagnosis Date  . Chronic foot pain   . Cocaine abuse (HCC)   . Diabetes mellitus without complication (HCC)   . Hepatitis C    unsure   . Homelessness   . Hypertension   . Neuropathy   . Polysubstance abuse (HCC)   . Schizophrenia (HCC)   . Sleep apnea   . Systolic and diastolic CHF, chronic [facility])     Patient Active Problem List   Diagnosis Date Noted  . Elevated troponin 10/18/2018  . HLD (hyperlipidemia) 10/18/2018  . Anxiety 10/18/2018  . CHF (congestive heart failure), NYHA class II, acute on chronic, combined (HCC) 10/18/2018  . Hypertensive urgency 10/12/2018  . Mild renal insufficiency 10/12/2018  . Cellulitis 10/12/2018  . Penile cellulitis   . Adjustment disorder with mixed disturbance of emotions and conduct   . Sleep apnea 10/03/2018  . Hypokalemia 09/17/2018  . Scrotal swelling 09/17/2018  . Urinary hesitancy   . Constipation   . Acute on chronic combined systolic and diastolic CHF (congestive heart failure) (HCC) 08/25/2018  . Acute respiratory failure with hypoxia (HCC) 08/25/2018  . Acute  on chronic combined systolic and diastolic congestive heart failure (HCC) 08/18/2018  . Tobacco use 08/18/2018  . 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 Homelessness 08/08/2018  . Smoker 08/08/2018  . Acute exacerbation of CHF (congestive heart failure) (HCC) 03/28/2018  . Prostate enlargement 03/16/2018  . Aortic atherosclerosis (HCC) 03/16/2018  . 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 Chronic foot pain   . Schizoaffective disorder, bipolar type (HCC) 09/30/2016  . Substance induced mood disorder (HCC) 03/13/2015  . Acute kidney failure (HCC) 01/26/2015  . Schizophrenia, paranoid type (HCC) 01/17/2015  . Drug hallucinosis (HCC) 10/08/2014  . Chronic paranoid schizophrenia (HCC) 09/07/2014  . 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 Urinary retention   . Cocaine use disorder, severe, dependence (HCC)   . Essential hypertension, benign 03/28/2013  . 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 MULTIPLE TOOTH EXTRACTIONS          Home Medications    Prior to Admission medications   Medication Sig Start Date End Date Taking? Authorizing Provider  ARIPiprazole (ABILIFY) 10 MG tablet Take 1 tablet (10 mg total) by mouth daily. 09/18/18   Meredeth Ide, MD  aspirin EC 81 MG tablet Take 1 tablet (81 mg total) by mouth daily. 10/19/18 02/16/19  Sharl Ma,  Sarina Ill, MD  atorvastatin (LIPITOR) 40 MG tablet Take 1 tablet (40 mg total) by mouth daily. 09/18/18   Meredeth Ide, MD  bisacodyl (DULCOLAX) 5 MG EC tablet Take 1 tablet (5 mg total) by mouth daily as needed for moderate constipation. 09/04/18   Rodolph Bong, MD  carbamazepine (TEGRETOL) 200 MG tablet Take 0.5 tablets (100 mg total) by mouth 2 (two) times daily. 09/18/18   Meredeth Ide, MD  carvedilol  (COREG) 6.25 MG tablet Take 1 tablet (6.25 mg total) by mouth 2 (two) times daily with a meal. 10/19/18   Sharl Ma, Sarina Ill, MD  cephALEXin (KEFLEX) 500 MG capsule Take 1 capsule (500 mg total) by mouth 4 (four) times daily. 10/22/18   Roxy Horseman, PA-C  furosemide (LASIX) 40 MG tablet Take 1 tablet (40 mg total) by mouth daily. 10/31/18   Dione Booze, MD  hydrocortisone (PROCTO-PAK) 1 % CREA Apply twice a day to hemorrhoids. 09/29/18   Renne Crigler, PA-C  Insulin Glargine (LANTUS) 100 UNIT/ML Solostar Pen Inject 14 Units into the skin daily. 09/18/18   Meredeth Ide, MD  Insulin Pen Needle 29G X MISC Use with insulin 09/18/18   Meredeth Ide, MD  lisinopril (PRINIVIL,ZESTRIL) 10 MG tablet Take 1 tablet (10 mg total) by mouth daily. 09/18/18   Meredeth Ide, MD  nicotine (NICODERM CQ - DOSED IN MG/24 HOURS) 21 mg/24hr patch Place 1 patch (21 mg total) onto the skin daily. 09/04/18   Rodolph Bong, MD  polyethylene glycol [facility] Hal) packet Take 17 g by mouth 2 (two) times daily. 09/18/18   Meredeth Ide, MD  Potassium Chloride ER 20 MEQ TBCR Take 20 mEq by mouth daily. 09/18/18   Meredeth Ide, MD  senna-docusate (SENOKOT-S) 8.6-50 MG tablet Take 1 tablet by mouth 2 (two) times daily. 09/04/18   Rodolph Bong, MD  tamsulosin [facility]) 0.4 MG CAPS capsule Take 1 capsule (0.4 mg total) by mouth daily after breakfast. 10/05/18   Narda Bonds, MD    Family History Family History  Problem Relation Age of Onset  . Hypertension Other   . Diabetes Other     Social History Social History   Tobacco Use  . Smoking status: Current Every Day Smoker    Packs/day: 1.00    Years: 20.00    Pack years: 20.00    Types: Cigarettes  . Smokeless tobacco: Current User  Substance Use Topics  . Alcohol use: Yes    Comment: Daily Drinker   . Drug use: Yes    Frequency: 7.0 times per week    Types: "Crack" cocaine, Cocaine, Marijuana     Allergies   Haldol [haloperidol]   Review of  Systems Review of Systems  Constitutional: Negative for fever.  Respiratory: Positive for shortness of breath. Negative for cough.   Cardiovascular: Positive for leg swelling. Negative for chest pain.  Gastrointestinal: Negative for abdominal pain and vomiting.  Genitourinary: Positive for scrotal swelling. Negative for difficulty urinating and dysuria.  Musculoskeletal: Negative for back pain.  All other systems reviewed and are negative.    Physical Exam Updated Vital Signs BP (!) 170/103   Pulse (!) 106   Resp 12   SpO2 93%   Physical Exam Vitals signs and nursing note reviewed.  Constitutional:      Appearance: He is well-developed.     Comments: Disheveled appearing, nontoxic  HENT:     Head: Normocephalic and atraumatic.  Mouth/Throat:     Mouth: Mucous membranes are moist.  Eyes:     Pupils: Pupils are equal, round, and reactive to light.  Neck:     Musculoskeletal: Neck supple.  Cardiovascular:     Rate and Rhythm: Normal rate and regular rhythm.     Heart sounds: Normal heart sounds.  Pulmonary:     Effort: Pulmonary effort is normal. No respiratory distress.     Breath sounds: Normal breath sounds. No wheezing.  Abdominal:     General: Bowel sounds are normal.     Palpations: Abdomen is soft.     Tenderness: There is no abdominal tenderness. There is no rebound.  Genitourinary:    Comments: Scrotal swelling, no crepitus Musculoskeletal:     Right lower leg: Edema present.     Left lower leg: Edema present.  Skin:    General: Skin is warm and dry.  Neurological:     Mental Status: He is alert and oriented to person, place, and time.  Psychiatric:     Comments: Currently cooperative      ED Treatments / Results  Labs (all labs ordered are listed, but only abnormal results are displayed) Labs Reviewed  CBC WITH DIFFERENTIAL/PLATELET - Abnormal; Notable for the following components:      Result Value   RBC 3.99 (*)    Hemoglobin 9.8 (*)    HCT  32.6 (*)    MCH 24.6 (*)    RDW 19.9 (*)    All other components within normal limits  BASIC METABOLIC PANEL - Abnormal; Notable for the following components:   Potassium 3.4 (*)    Glucose, Bld 253 (*)    Calcium 8.5 (*)    All other components within normal limits  BRAIN NATRIURETIC PEPTIDE - Abnormal; Notable for the following components:   B Natriuretic Peptide 997.9 (*)    All other components within normal limits    EKG EKG Interpretation  Date/Time:  Wednesday Nov 04 2018 02:54:44 EDT Ventricular Rate:  102 PR Interval:    QRS Duration: 91 QT Interval:  360 QTC Calculation: 469 R Axis:   47 Text Interpretation:  Sinus tachycardia Probable left atrial enlargement Anterior infarct, old Nonspecific T abnormalities, lateral leads Rate faster Confirmed by Ross Marcus ([id number]) on 11/04/2018 2:56:21 AM   Radiology Dg Chest Portable 1 View  Result Date: 11/04/2018 CLINICAL DATA:  Bilateral leg swelling, shortness of breath EXAM: PORTABLE CHEST 1 VIEW COMPARISON:  10/26/2018 FINDINGS: Cardiomegaly with vascular congestion. Interstitial prominence likely reflects early interstitial edema. Right base atelectasis. No significant effusions or acute bony abnormality. IMPRESSION: Cardiomegaly with vascular congestion and probable early interstitial edema. Electronically Signed   By: Charlett Nose M.D.   On: 11/04/2018 03:09    Procedures Procedures (including critical care time)  Medications Ordered in ED Medications  acetaminophen (TYLENOL) tablet 325 mg (325 mg Oral Given 11/04/18 0257)  furosemide (LASIX) injection 40 mg (40 mg Intramuscular Given 11/04/18 0412)     Initial Impression / Assessment and Plan / ED Course  I have reviewed the triage vital signs and the nursing notes.  Pertinent labs & imaging results that were available during my care of the patient were reviewed by me and considered in my medical decision making (see chart for details).        Patient presents  with lower extremity swelling and scrotal swelling.  He reports shortness of breath.  He otherwise appears at his baseline.  Vital signs notable for blood  pressure of 170/103 had a heart rate 106.  He is appear volume overloaded.  Unclear how change this is from his baseline.  Because he is endorsing shortness of breath and his O2 sats are 93%, will obtain some basic lab work-up and a chest x-ray.  Lab work-up is reassuring.  Chest x-ray shows vascular congestion and early pulmonary edema.  Patient has not been taking his Lasix.  He was given 40 mg of IV Lasix.  EKG showed no signs of acute ischemia.  Patient was able to ambulate and maintain pulse ox greater than 91% with ambulation.  I stressed to the patient that it is very important that he get his Lasix.  At this time I do not feel there is an indication for admission; however, I discussed with him that his status is tenuous and that if he does not take his Lasix, he may become more short of breath.  After history, exam, and medical workup I feel the patient has been appropriately medically screened and is safe for discharge home. Pertinent diagnoses were discussed with the patient. Patient was given return precautions.   Final Clinical Impressions(s) / ED Diagnoses   Final diagnoses:  Lower extremity edema  Pulmonary vascular congestion    ED Discharge Orders    None       Shon BatonHorton, Courtney F, MD 11/04/18 [phone]

## 2018-11-04 NOTE — Discharge Instructions (Addendum)
You were seen today for lower extremity swelling and shortness of breath.  It is very important that you take your Lasix as prescribed.  If you develop chest pain, worsening shortness of breath, any new or worsening symptoms you should be reevaluated.

## 2018-11-04 NOTE — ED Triage Notes (Signed)
BIB EMS, C/O bilateral leg swelling and scrotal swelling. Pt states he did not pick up his Rx for Lasix. Cocaine use earlier in evening.

## 2018-11-05 ENCOUNTER — Emergency Department (HOSPITAL_COMMUNITY)
Admission: EM | Admit: 2018-11-05 | Discharge: 2018-11-05 | Disposition: A | Discharge status: Home / Self Care | Payer: Medicaid Other | Attending: Emergency Medicine | Admitting: Emergency Medicine

## 2018-11-05 ENCOUNTER — Other Ambulatory Visit: Payer: Self-pay

## 2018-11-05 ENCOUNTER — Emergency Department (HOSPITAL_COMMUNITY): Payer: Medicaid Other

## 2018-11-05 ENCOUNTER — Encounter (HOSPITAL_COMMUNITY): Payer: Self-pay | Admitting: Emergency Medicine

## 2018-11-05 DIAGNOSIS — H109 Unspecified conjunctivitis: Secondary | ICD-10-CM | POA: Diagnosis not present

## 2018-11-05 DIAGNOSIS — N189 Chronic kidney disease, unspecified: Secondary | ICD-10-CM | POA: Diagnosis not present

## 2018-11-05 DIAGNOSIS — Z7982 Long term (current) use of aspirin: Secondary | ICD-10-CM | POA: Diagnosis not present

## 2018-11-05 DIAGNOSIS — F17210 Nicotine dependence, cigarettes, uncomplicated: Secondary | ICD-10-CM | POA: Insufficient documentation

## 2018-11-05 DIAGNOSIS — I129 Hypertensive chronic kidney disease with stage 1 through stage 4 chronic kidney disease, or unspecified chronic kidney disease: Secondary | ICD-10-CM | POA: Diagnosis not present

## 2018-11-05 DIAGNOSIS — Z794 Long term (current) use of insulin: Secondary | ICD-10-CM | POA: Insufficient documentation

## 2018-11-05 DIAGNOSIS — F1290 Cannabis use, unspecified, uncomplicated: Secondary | ICD-10-CM | POA: Insufficient documentation

## 2018-11-05 DIAGNOSIS — F1490 Cocaine use, unspecified, uncomplicated: Secondary | ICD-10-CM | POA: Diagnosis not present

## 2018-11-05 DIAGNOSIS — F209 Schizophrenia, unspecified: Secondary | ICD-10-CM | POA: Diagnosis not present

## 2018-11-05 DIAGNOSIS — Z79899 Other long term (current) drug therapy: Secondary | ICD-10-CM | POA: Insufficient documentation

## 2018-11-05 DIAGNOSIS — I5042 Chronic combined systolic (congestive) and diastolic (congestive) heart failure: Secondary | ICD-10-CM | POA: Insufficient documentation

## 2018-11-05 DIAGNOSIS — E119 Type 2 diabetes mellitus without complications: Secondary | ICD-10-CM | POA: Diagnosis not present

## 2018-11-05 DIAGNOSIS — R0602 Shortness of breath: Secondary | ICD-10-CM | POA: Diagnosis present

## 2018-11-05 DIAGNOSIS — I509 Heart failure, unspecified: Secondary | ICD-10-CM

## 2018-11-05 LAB — CBC WITH DIFFERENTIAL/PLATELET
Abs Immature Granulocytes: 0.02 10*3/uL (ref 0.00–0.07)
Basophils Absolute: 0 10*3/uL (ref 0.0–0.1)
Basophils Relative: 1 %
Eosinophils Absolute: 0.3 10*3/uL (ref 0.0–0.5)
Eosinophils Relative: 4 %
HCT: 32.3 % — ABNORMAL LOW (ref 39.0–52.0)
Hemoglobin: 9.3 g/dL — ABNORMAL LOW (ref 13.0–17.0)
Immature Granulocytes: 0 %
Lymphocytes Relative: 12 %
Lymphs Abs: 1 10*3/uL (ref 0.7–4.0)
MCH: 24.2 pg — ABNORMAL LOW (ref 26.0–34.0)
MCHC: 28.8 g/dL — ABNORMAL LOW (ref 30.0–36.0)
MCV: 83.9 fL (ref 80.0–100.0)
Monocytes Absolute: 0.9 10*3/uL (ref 0.1–1.0)
Monocytes Relative: 11 %
Neutro Abs: 5.8 10*3/uL (ref 1.7–7.7)
Neutrophils Relative %: 72 %
Platelets: 256 10*3/uL (ref 150–400)
RBC: 3.85 MIL/uL — ABNORMAL LOW (ref 4.22–5.81)
RDW: 19.9 % — ABNORMAL HIGH (ref 11.5–15.5)
WBC: 8 10*3/uL (ref 4.0–10.5)
nRBC: 0 % (ref 0.0–0.2)

## 2018-11-05 LAB — BASIC METABOLIC PANEL
Anion gap: 8 (ref 5–15)
BUN: 18 mg/dL (ref 6–20)
CO2: 31 mmol/L (ref 22–32)
Calcium: 8.8 mg/dL — ABNORMAL LOW (ref 8.9–10.3)
Chloride: 103 mmol/L (ref 98–111)
Creatinine, Ser: 1.01 mg/dL (ref 0.61–1.24)
GFR calc Af Amer: >60 mL/min (ref >60)
GFR calc non Af Amer: >60 mL/min (ref >60)
Glucose, Bld: 199 mg/dL — ABNORMAL HIGH (ref 70–99)
Potassium: 3.5 mmol/L (ref 3.5–5.1)
Sodium: 142 mmol/L (ref 135–145)

## 2018-11-05 LAB — ETHANOL: Alcohol, Ethyl (B): <10 mg/dL (ref <10)

## 2018-11-05 LAB — BRAIN NATRIURETIC PEPTIDE: B Natriuretic Peptide: 1228.9 pg/mL — ABNORMAL HIGH (ref 0.0–100.0)

## 2018-11-05 MED ORDER — TOBRAMYCIN 0.3 % OP SOLN
2.0000 [drp] | Freq: Once | OPHTHALMIC | Status: AC
  Administered 2018-11-05: 2 [drp] via OPHTHALMIC
  Filled 2018-11-05: qty 5

## 2018-11-05 MED ORDER — DIPHENHYDRAMINE HCL 50 MG/ML IJ SOLN
25.0000 mg | Freq: Once | INTRAMUSCULAR | Status: AC
  Administered 2018-11-05: 25 mg via INTRAVENOUS
  Filled 2018-11-05: qty 1

## 2018-11-05 MED ORDER — FUROSEMIDE 10 MG/ML IJ SOLN
40.0000 mg | Freq: Once | INTRAMUSCULAR | Status: AC
  Administered 2018-11-05: 40 mg via INTRAVENOUS
  Filled 2018-11-05: qty 4

## 2018-11-05 NOTE — ED Provider Notes (Signed)
[facility] EMERGENCY [facility] Provider Note   CSN: [id number] Arrival date & time: 11/05/18  1700    History   Chief Complaint Chief Complaint  Patient presents with  . Shortness of Breath  . Eye Drainage    HPI [name] is a 58 y.o. male.     Patient is a 58 year old male with history of schizophrenia, polysubstance abuse, homelessness, congestive heart failure, chronic renal insufficiency.  He is well-known to the emergency department for visits involving complaints related to the above conditions.  Patient is also known to use the emergency department for food and shelter.  He presents today with complaints of shortness of breath, swelling of the testicles, and bilateral eye drainage.  Patient is somewhat difficult to get a history from, however these complaints appear chronic.  Patient is suspected of being noncompliant with his medications.  The history is provided by the patient.  Shortness of Breath  Severity:  Moderate Onset quality:  Gradual Timing:  Constant Progression:  Worsening Chronicity:  Recurrent Relieved by:  Nothing Worsened by:  Nothing Ineffective treatments:  None tried   Past Medical History:  Diagnosis Date  . Chronic foot pain   . Cocaine abuse (HCC)   . Diabetes mellitus without complication (HCC)   . Hepatitis C    unsure   . Homelessness   . Hypertension   . Neuropathy   . Polysubstance abuse (HCC)   . Schizophrenia (HCC)   . Sleep apnea   . Systolic and diastolic CHF, chronic [facility](HCC)     Patient Active Problem List   Diagnosis Date Noted  . Elevated troponin 10/18/2018  . HLD (hyperlipidemia) 10/18/2018  . Anxiety 10/18/2018  . CHF (congestive heart failure), NYHA class II, acute on chronic, combined (HCC) 10/18/2018  . Hypertensive urgency 10/12/2018  . Mild renal insufficiency 10/12/2018  . Cellulitis 10/12/2018  . Penile cellulitis   . Adjustment disorder with mixed disturbance of emotions and  conduct   . Sleep apnea 10/03/2018  . Hypokalemia 09/17/2018  . Scrotal swelling 09/17/2018  . Urinary hesitancy   . Constipation   . Acute on chronic combined systolic and diastolic CHF (congestive heart failure) (HCC) 08/25/2018  . Acute respiratory failure with hypoxia (HCC) 08/25/2018  . Acute on chronic combined systolic and diastolic congestive heart failure (HCC) 08/18/2018  . Tobacco use 08/18/2018  . 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 Homelessness 08/08/2018  . Smoker 08/08/2018  . Acute exacerbation of CHF (congestive heart failure) (HCC) 03/28/2018  . Prostate enlargement 03/16/2018  . Aortic atherosclerosis (HCC) 03/16/2018  . 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 Chronic foot pain   . Schizoaffective disorder, bipolar type (HCC) 09/30/2016  . Substance induced mood disorder (HCC) 03/13/2015  . Acute kidney failure (HCC) 01/26/2015  . Schizophrenia, paranoid type (HCC) 01/17/2015  . Drug hallucinosis (HCC) 10/08/2014  . Chronic paranoid schizophrenia (HCC) 09/07/2014  . 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 Urinary retention   . Cocaine use disorder, severe, dependence (HCC)   . Essential hypertension, benign 03/28/2013  . 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 MULTIPLE TOOTH EXTRACTIONS          Home Medications    Prior to Admission medications   Medication Sig Start Date End Date Taking? Authorizing Provider  ARIPiprazole (  ABILIFY) 10 MG tablet Take 1 tablet (10 mg total) by mouth daily. 09/18/18   Meredeth Ide, MD  aspirin EC 81 MG tablet Take 1 tablet (81 mg total) by mouth daily. 10/19/18 02/16/19  Meredeth Ide, MD  atorvastatin (LIPITOR) 40 MG tablet Take 1 tablet (40 mg total) by mouth daily. 09/18/18   Meredeth Ide, MD  bisacodyl (DULCOLAX) 5 MG EC tablet Take 1 tablet (5 mg total) by mouth daily as needed for moderate constipation. 09/04/18   Rodolph Bong, MD  carbamazepine (TEGRETOL) 200 MG tablet Take 0.5 tablets (100 mg total) by mouth 2 (two) times daily. 09/18/18   Meredeth Ide, MD  carvedilol (COREG) 6.25 MG tablet Take 1 tablet (6.25 mg total) by mouth 2 (two) times daily with a meal. 10/19/18   Sharl Ma, Sarina Ill, MD  cephALEXin (KEFLEX) 500 MG capsule Take 1 capsule (500 mg total) by mouth 4 (four) times daily. 10/22/18   Roxy Horseman, PA-C  furosemide (LASIX) 40 MG tablet Take 1 tablet (40 mg total) by mouth daily. 10/31/18   Dione Booze, MD  hydrocortisone (PROCTO-PAK) 1 % CREA Apply twice a day to hemorrhoids. 09/29/18   Renne Crigler, PA-C  Insulin Glargine (LANTUS) 100 UNIT/ML Solostar Pen Inject 14 Units into the skin daily. 09/18/18   Meredeth Ide, MD  Insulin Pen Needle 29G X MISC Use with insulin 09/18/18   Meredeth Ide, MD  lisinopril (PRINIVIL,ZESTRIL) 10 MG tablet Take 1 tablet (10 mg total) by mouth daily. 09/18/18   Meredeth Ide, MD  nicotine (NICODERM CQ - DOSED IN MG/24 HOURS) 21 mg/24hr patch Place 1 patch (21 mg total) onto the skin daily. 09/04/18   Rodolph Bong, MD  polyethylene glycol [facility] / Ethelene Hal) packet Take 17 g by mouth 2 (two) times daily. 09/18/18   Meredeth Ide, MD  Potassium Chloride ER 20 MEQ TBCR Take 20 mEq by mouth daily. 09/18/18   Meredeth Ide, MD  senna-docusate (SENOKOT-S) 8.6-50 MG tablet Take 1 tablet by mouth 2 (two) times daily. 09/04/18   Rodolph Bong, MD  tamsulosin [facility]) 0.4 MG CAPS capsule Take 1 capsule (0.4 mg total) by mouth daily after breakfast. 10/05/18   Narda Bonds, MD    Family History Family History  Problem Relation Age of Onset  . Hypertension Other   . Diabetes Other     Social History Social History   Tobacco Use  . Smoking status: Current Every Day Smoker    Packs/day: 1.00    Years: 20.00     Pack years: 20.00    Types: Cigarettes  . Smokeless tobacco: Current User  Substance Use Topics  . Alcohol use: Yes    Comment: Daily Drinker   . Drug use: Yes    Frequency: 7.0 times per week    Types: "Crack" cocaine, Cocaine, Marijuana     Allergies   Haldol [haloperidol]   Review of Systems Review of Systems  Respiratory: Positive for shortness of breath.      Physical Exam Updated Vital Signs BP (!) 154/90 (BP Location: Right Arm)   Pulse (!) 105   Resp (!) 24   SpO2 (!) 86%   Physical Exam Vitals signs and nursing note reviewed.  Constitutional:      General: He is not in acute distress.    Appearance: He is well-developed. He is not diaphoretic.  HENT:     Head: Normocephalic and atraumatic.  Eyes:  Comments: There is bilateral chemosis of the conjunctiva noted.  There is some clear drainage noted as well.  Neck:     Musculoskeletal: Normal range of motion and neck supple.  Cardiovascular:     Rate and Rhythm: Normal rate and regular rhythm.     Heart sounds: No murmur. No friction rub.  Pulmonary:     Effort: Pulmonary effort is normal. No respiratory distress.     Breath sounds: Normal breath sounds. No wheezing or rales.  Abdominal:     General: Bowel sounds are normal. There is no distension.     Palpations: Abdomen is soft.     Tenderness: There is no abdominal tenderness.  Musculoskeletal: Normal range of motion.  Skin:    General: Skin is warm and dry.  Neurological:     Mental Status: He is alert and oriented to person, place, and time.     Coordination: Coordination normal.      ED Treatments / Results  Labs (all labs ordered are listed, but only abnormal results are displayed) Labs Reviewed  BASIC METABOLIC PANEL  CBC WITH DIFFERENTIAL/PLATELET  BRAIN NATRIURETIC PEPTIDE  ETHANOL    EKG None  Radiology Dg Chest 2 View  Result Date: 11/05/2018 CLINICAL DATA:  Shortness of breath EXAM: CHEST - 2 VIEW COMPARISON:  11/04/2018,  10/26/2018, 10/17/2018 FINDINGS: Cardiomegaly with vascular congestion and mild diffuse interstitial process, likely edema. Small bilateral pleural effusions. No pneumothorax. Linear atelectasis or scar in the left mid lung. IMPRESSION: Cardiomegaly with vascular congestion, mild interstitial edema and small bilateral pleural effusions. Electronically Signed   By: [name]   On: 11/05/2018 17:57   Dg Chest Portable 1 View  Result Date: 11/04/2018 CLINICAL DATA:  Bilateral leg swelling, shortness of breath EXAM: PORTABLE CHEST 1 VIEW COMPARISON:  10/26/2018 FINDINGS: Cardiomegaly with vascular congestion. Interstitial prominence likely reflects early interstitial edema. Right base atelectasis. No significant effusions or acute bony abnormality. IMPRESSION: Cardiomegaly with vascular congestion and probable early interstitial edema. Electronically Signed   By: Charlett Nose M.D.   On: 11/04/2018 03:09    Procedures Procedures (including critical care time)  Medications Ordered in ED Medications  furosemide (LASIX) injection 40 mg (has no administration in time range)  diphenhydrAMINE (BENADRYL) injection 25 mg (has no administration in time range)     Initial Impression / Assessment and Plan / ED Course  I have reviewed the triage vital signs and the nursing notes.  Pertinent labs & imaging results that were available during my care of the patient were reviewed by me and considered in my medical decision making (see chart for details).  This patient is very well-known to the emergency department.  He has a history of CHF, homelessness, schizophrenia, and cocaine abuse.  He presents today with complaints of shortness of breath and swelling eyes.  Patient frequents the ER with dyspnea and is noncompliant with his Lasix which he is supposed to be taking for his CHF.  Patient given IV Lasix with a good diuresis.  His oxygen saturations are in the upper 90s on room air and his blood  pressures have been adequate.  BNP is elevated and chest x-ray does show evidence for failure, however he is in no respiratory distress and following diuresis appears more comfortable.  Patient's main concern seems to be his eyes.  He does have what appear to be bilateral chemosis which I suspect is allergic in nature, possibly infectious.  He was given IV Benadryl here and remains  somnolent, but arousable.  Patient will be discharged with advice to fill his Lasix prescription and take this as prescribed.  He will also be given Tobrex drops which he can use for possible conjunctivitis.  He will also need to begin using Visine drops over-the-counter as well.  Final Clinical Impressions(s) / ED Diagnoses   Final diagnoses:  None    ED Discharge Orders    None       Geoffery Lyons, MD 11/05/18 2229

## 2018-11-05 NOTE — Discharge Instructions (Addendum)
Fill your prescription for Lasix and take as directed.  Tobrex drops 2 drops each eye 4 times daily while awake for the next several days.  Follow-up with your primary doctor.

## 2018-11-05 NOTE — ED Notes (Signed)
Patient verbalizes understanding of discharge instructions. Opportunity for questioning and answers were provided. Armband removed by staff, pt discharged from ED via wheelchair to home.  

## 2018-11-05 NOTE — ED Triage Notes (Signed)
Pt c/o shortness of breath, testicle swelling and bilateral eye drainage. Denies chest pain.

## 2018-11-06 ENCOUNTER — Emergency Department (HOSPITAL_COMMUNITY)
Admission: EM | Admit: 2018-11-06 | Discharge: 2018-11-06 | Disposition: A | Discharge status: Home / Self Care | Payer: Medicare Other | Source: Home / Self Care | Attending: Emergency Medicine | Admitting: Emergency Medicine

## 2018-11-06 ENCOUNTER — Encounter (HOSPITAL_COMMUNITY): Payer: Self-pay | Admitting: Emergency Medicine

## 2018-11-06 ENCOUNTER — Emergency Department (HOSPITAL_COMMUNITY): Payer: Medicare Other

## 2018-11-06 DIAGNOSIS — E119 Type 2 diabetes mellitus without complications: Secondary | ICD-10-CM | POA: Insufficient documentation

## 2018-11-06 DIAGNOSIS — Z9114 Patient's other noncompliance with medication regimen: Secondary | ICD-10-CM | POA: Insufficient documentation

## 2018-11-06 DIAGNOSIS — I110 Hypertensive heart disease with heart failure: Secondary | ICD-10-CM | POA: Insufficient documentation

## 2018-11-06 DIAGNOSIS — Z7982 Long term (current) use of aspirin: Secondary | ICD-10-CM | POA: Insufficient documentation

## 2018-11-06 DIAGNOSIS — F17210 Nicotine dependence, cigarettes, uncomplicated: Secondary | ICD-10-CM | POA: Insufficient documentation

## 2018-11-06 DIAGNOSIS — I5043 Acute on chronic combined systolic (congestive) and diastolic (congestive) heart failure: Secondary | ICD-10-CM | POA: Insufficient documentation

## 2018-11-06 DIAGNOSIS — Z794 Long term (current) use of insulin: Secondary | ICD-10-CM | POA: Insufficient documentation

## 2018-11-06 DIAGNOSIS — Z79899 Other long term (current) drug therapy: Secondary | ICD-10-CM | POA: Insufficient documentation

## 2018-11-06 DIAGNOSIS — H1013 Acute atopic conjunctivitis, bilateral: Secondary | ICD-10-CM | POA: Insufficient documentation

## 2018-11-06 DIAGNOSIS — R7989 Other specified abnormal findings of blood chemistry: Secondary | ICD-10-CM | POA: Diagnosis not present

## 2018-11-06 LAB — CBC WITH DIFFERENTIAL/PLATELET
Abs Immature Granulocytes: 0.03 10*3/uL (ref 0.00–0.07)
Basophils Absolute: 0 10*3/uL (ref 0.0–0.1)
Basophils Relative: 0 %
Eosinophils Absolute: 0.1 10*3/uL (ref 0.0–0.5)
Eosinophils Relative: 1 %
HCT: 32.7 % — ABNORMAL LOW (ref 39.0–52.0)
Hemoglobin: 9.4 g/dL — ABNORMAL LOW (ref 13.0–17.0)
Immature Granulocytes: 0 %
Lymphocytes Relative: 12 %
Lymphs Abs: 0.9 10*3/uL (ref 0.7–4.0)
MCH: 24.5 pg — ABNORMAL LOW (ref 26.0–34.0)
MCHC: 28.7 g/dL — ABNORMAL LOW (ref 30.0–36.0)
MCV: 85.4 fL (ref 80.0–100.0)
Monocytes Absolute: 0.6 10*3/uL (ref 0.1–1.0)
Monocytes Relative: 9 %
Neutro Abs: 5.8 10*3/uL (ref 1.7–7.7)
Neutrophils Relative %: 78 %
Platelets: 227 10*3/uL (ref 150–400)
RBC: 3.83 MIL/uL — ABNORMAL LOW (ref 4.22–5.81)
RDW: 19.8 % — ABNORMAL HIGH (ref 11.5–15.5)
WBC: 7.4 10*3/uL (ref 4.0–10.5)
nRBC: 0.3 % — ABNORMAL HIGH (ref 0.0–0.2)

## 2018-11-06 LAB — BASIC METABOLIC PANEL
Anion gap: 7 (ref 5–15)
BUN: 20 mg/dL (ref 6–20)
CO2: 26 mmol/L (ref 22–32)
Calcium: 8.5 mg/dL — ABNORMAL LOW (ref 8.9–10.3)
Chloride: 104 mmol/L (ref 98–111)
Creatinine, Ser: 0.98 mg/dL (ref 0.61–1.24)
GFR calc Af Amer: >60 mL/min (ref >60)
GFR calc non Af Amer: >60 mL/min (ref >60)
Glucose, Bld: 317 mg/dL — ABNORMAL HIGH (ref 70–99)
Potassium: 3.6 mmol/L (ref 3.5–5.1)
Sodium: 137 mmol/L (ref 135–145)

## 2018-11-06 LAB — MAGNESIUM: Magnesium: 1.9 mg/dL (ref 1.7–2.4)

## 2018-11-06 LAB — BRAIN NATRIURETIC PEPTIDE: B Natriuretic Peptide: 993.1 pg/mL — ABNORMAL HIGH (ref 0.0–100.0)

## 2018-11-06 MED ORDER — FUROSEMIDE 10 MG/ML IJ SOLN
40.0000 mg | Freq: Once | INTRAMUSCULAR | Status: AC
  Administered 2018-11-06: 01:00:00 40 mg via INTRAVENOUS
  Filled 2018-11-06: qty 4

## 2018-11-06 MED ORDER — FUROSEMIDE 40 MG PO TABS: 40.0000 mg | ORAL_TABLET | Freq: Every day | ORAL | 3 refills | Status: DC

## 2018-11-06 MED ORDER — NAPHAZOLINE-PHENIRAMINE 0.025-0.3 % OP SOLN
1.0000 [drp] | Freq: Once | OPHTHALMIC | Status: AC
  Administered 2018-11-06: 1 [drp] via OPHTHALMIC
  Filled 2018-11-06: qty 15

## 2018-11-06 NOTE — Discharge Instructions (Signed)
Put one drop of the Naphcon in each eye four times a day.  Make sure you get the prescription filled for furosemide (Lasix) and take it every day.  Return if you are having any problems.

## 2018-11-06 NOTE — ED Triage Notes (Addendum)
Patient complains of SOB, testicle pain. Patient oxygen saturation in the 80s, placed on 2 L [place], patient at 90%. Dyspnea with exertion and at rest. Patient states he needs some Lasix

## 2018-11-06 NOTE — Progress Notes (Signed)
Pt walked in to [facility] accompanied by [employer].  He was requesting detox.  Vital signs were obtained.  O2 sats were initially 89-90.  He was groggy and kept falling asleep while trying to talk with him.  He was coughing in the lobby and did report having some shortness of breath.  O2 sats only reached 92% on room air.  Staffed with AC, [place], and it was recommended that he be seen at [facility] as medically acute for the unit.

## 2018-11-06 NOTE — ED Notes (Signed)
Patient appears dyspneic and oxygen saturation in the 80s, 3L [place] applied, patient oxygen in the 90s now.

## 2018-11-06 NOTE — ED Provider Notes (Signed)
[facility]-EMERGENCY DEPT Provider Note   CSN: [id number] Arrival date & time: 11/06/18  0009    History   Chief Complaint Chief Complaint  Patient presents with  . Shortness of Breath    HPI [name] is a 58 y.o. male.   The history is provided by the patient.  Shortness of Breath  He has a history of schizophrenia, homelessness, diabetes, combined systolic and diastolic heart failure, polysubstance abuse and comes to the ED complaining of shortness of breath and eye irritation. He denies chest pain, fever, cough. He says he must be allergic to something in the jail. He has chronic edema which is unchanged. He had been given a prescription for furosemide, which has not been filled.  Past Medical History:  Diagnosis Date  . Chronic foot pain   . Cocaine abuse (HCC)   . Diabetes mellitus without complication (HCC)   . Hepatitis C    unsure   . Homelessness   . Hypertension   . Neuropathy   . Polysubstance abuse (HCC)   . Schizophrenia (HCC)   . Sleep apnea   . Systolic and diastolic CHF, chronic [facility])     Patient Active Problem List   Diagnosis Date Noted  . Elevated troponin 10/18/2018  . HLD (hyperlipidemia) 10/18/2018  . Anxiety 10/18/2018  . CHF (congestive heart failure), NYHA class II, acute on chronic, combined (HCC) 10/18/2018  . Hypertensive urgency 10/12/2018  . Mild renal insufficiency 10/12/2018  . Cellulitis 10/12/2018  . Penile cellulitis   . Adjustment disorder with mixed disturbance of emotions and conduct   . Sleep apnea 10/03/2018  . Hypokalemia 09/17/2018  . Scrotal swelling 09/17/2018  . Urinary hesitancy   . Constipation   . Acute on chronic combined systolic and diastolic CHF (congestive heart failure) (HCC) 08/25/2018  . Acute respiratory failure with hypoxia (HCC) 08/25/2018  . Acute on chronic combined systolic and diastolic congestive heart failure (HCC) 08/18/2018  . Tobacco use 08/18/2018  . Acute on  chronic combined systolic (congestive) and diastolic (congestive) heart failure (HCC) 08/08/2018  . Homelessness 08/08/2018  . Smoker 08/08/2018  . Acute exacerbation of CHF (congestive heart failure) (HCC) 03/28/2018  . Prostate enlargement 03/16/2018  . Aortic atherosclerosis (HCC) 03/16/2018  . 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 Chronic foot pain   . Schizoaffective disorder, bipolar type (HCC) 09/30/2016  . Substance induced mood disorder (HCC) 03/13/2015  . Acute kidney failure (HCC) 01/26/2015  . Schizophrenia, paranoid type (HCC) 01/17/2015  . Drug hallucinosis (HCC) 10/08/2014  . Chronic paranoid schizophrenia (HCC) 09/07/2014  . 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 Urinary retention   . Cocaine use disorder, severe, dependence (HCC)   . Essential hypertension, benign 03/28/2013  . 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 MULTIPLE TOOTH EXTRACTIONS          Home Medications    Prior to Admission medications   Medication Sig Start Date End Date Taking? Authorizing Provider  ARIPiprazole (ABILIFY) 10 MG tablet Take 1 tablet (10 mg total) by mouth daily. 09/18/18   Meredeth Ide, MD  aspirin EC 81 MG tablet Take 1 tablet (81 mg total) by mouth daily. 10/19/18 02/16/19  Meredeth Ide, MD  atorvastatin (LIPITOR) 40 MG tablet Take 1 tablet (40 mg total) by mouth daily. 09/18/18   Meredeth Ide, MD  bisacodyl (DULCOLAX) 5 MG EC tablet Take 1 tablet (5 mg total) by mouth daily as needed for moderate constipation. 09/04/18   Rodolph Bonghompson, Daniel V, MD  carbamazepine (TEGRETOL) 200 MG tablet Take 0.5 tablets (100 mg total) by mouth 2 (two) times daily. 09/18/18   Meredeth IdeLama, Gagan S, MD  carvedilol (COREG) 6.25 MG tablet Take 1 tablet (6.25 mg total) by mouth 2 (two) times daily with a meal. 10/19/18   Sharl MaLama, Sarina IllGagan S, MD   cephALEXin (KEFLEX) 500 MG capsule Take 1 capsule (500 mg total) by mouth 4 (four) times daily. 10/22/18   Roxy HorsemanBrowning, Robert, PA-C  furosemide (LASIX) 40 MG tablet Take 1 tablet (40 mg total) by mouth daily. 10/31/18   Dione BoozeGlick, Johnica Armwood, MD  hydrocortisone (PROCTO-PAK) 1 % CREA Apply twice a day to hemorrhoids. 09/29/18   Renne CriglerGeiple, Joshua, PA-C  Insulin Glargine (LANTUS) 100 UNIT/ML Solostar Pen Inject 14 Units into the skin daily. 09/18/18   Meredeth IdeLama, Gagan S, MD  Insulin Pen Needle 29G X 8MM MISC Use with insulin 09/18/18   Meredeth IdeLama, Gagan S, MD  lisinopril (PRINIVIL,ZESTRIL) 10 MG tablet Take 1 tablet (10 mg total) by mouth daily. 09/18/18   Meredeth IdeLama, Gagan S, MD  nicotine (NICODERM CQ - DOSED IN MG/24 HOURS) 21 mg/24hr patch Place 1 patch (21 mg total) onto the skin daily. 09/04/18   Rodolph Bonghompson, Daniel V, MD  polyethylene glycol [facility](MIRALAX / Ethelene HalGLYCOLAX) packet Take 17 g by mouth 2 (two) times daily. 09/18/18   Meredeth IdeLama, Gagan S, MD  Potassium Chloride ER 20 MEQ TBCR Take 20 mEq by mouth daily. 09/18/18   Meredeth IdeLama, Gagan S, MD  senna-docusate (SENOKOT-S) 8.6-50 MG tablet Take 1 tablet by mouth 2 (two) times daily. 09/04/18   Rodolph Bonghompson, Daniel V, MD  tamsulosin [facility](FLOMAX) 0.4 MG CAPS capsule Take 1 capsule (0.4 mg total) by mouth daily after breakfast. 10/05/18   Narda BondsNettey, Ralph A, MD    Family History Family History  Problem Relation Age of Onset  . Hypertension Other   . Diabetes Other     Social History Social History   Tobacco Use  . Smoking status: Current Every Day Smoker    Packs/day: 1.00    Years: 20.00    Pack years: 20.00    Types: Cigarettes  . Smokeless tobacco: Current User  Substance Use Topics  . Alcohol use: Yes    Comment: Daily Drinker   . Drug use: Yes    Frequency: 7.0 times per week    Types: "Crack" cocaine, Cocaine, Marijuana     Allergies   Haldol [haloperidol]   Review of Systems Review of Systems  Respiratory: Positive for shortness of breath.   All other systems reviewed and are negative.     Physical Exam Updated Vital Signs BP (!) 150/86 (BP Location: Right Arm)   Pulse (!) 117   Temp 98.2 F (36.8 C) (Oral)   Resp (!) 24   Ht 5\' 11"  (1.803 m)   Wt 97.5 kg   SpO2 94%   BMI 29.99 kg/m   Physical Exam Vitals signs and nursing note reviewed.    58 year old male, resting comfortably and in no acute distress. Vital signs are significant for elevated heart rate, respiratory rate, and blood pressure. Oxygen saturation is 94%, which is normal. Head is normocephalic and atraumatic. PERRLA, EOMI. Oropharynx is clear. Bilateral conjunctival injection with mild chemosis. Neck is nontender and supple without adenopathy or JVD. Back is nontender and there is no CVA tenderness. Lungs have few  bibasilar rales without wheezes or rhonchi. Chest is nontender. Heart has regular rate and rhythm without murmur. Abdomen is soft, flat, nontender without masses or hepatosplenomegaly and peristalsis is normoactive. Genitalia: Circumcised penis. Moderate penile and scrotal edema. Extremities have 3+ edema, full range of motion is present. Skin is warm and dry without rash. Neurologic: Awake and alert, oriented times three, cranial nerves are intact, there are no motor or sensory deficits.  ED Treatments / Results  Labs (all labs ordered are listed, but only abnormal results are displayed) Labs Reviewed  BASIC METABOLIC PANEL - Abnormal; Notable for the following components:      Result Value   Glucose, Bld 317 (*)    Calcium 8.5 (*)    All other components within normal limits  CBC WITH DIFFERENTIAL/PLATELET - Abnormal; Notable for the following components:   RBC 3.83 (*)    Hemoglobin 9.4 (*)    HCT 32.7 (*)    MCH 24.5 (*)    MCHC 28.7 (*)    RDW 19.8 (*)    nRBC 0.3 (*)    All other components within normal limits  BRAIN NATRIURETIC PEPTIDE - Abnormal; Notable for the following components:   B Natriuretic Peptide 993.1 (*)    All other components within normal limits   MAGNESIUM    Radiology Dg Chest 2 View  Result Date: 11/05/2018 CLINICAL DATA:  Shortness of breath EXAM: CHEST - 2 VIEW COMPARISON:  11/04/2018, 10/26/2018, 10/17/2018 FINDINGS: Cardiomegaly with vascular congestion and mild diffuse interstitial process, likely edema. Small bilateral pleural effusions. No pneumothorax. Linear atelectasis or scar in the left mid lung. IMPRESSION: Cardiomegaly with vascular congestion, mild interstitial edema and small bilateral pleural effusions. Electronically Signed   By: [name]   On: 11/05/2018 17:57   Dg Chest Port 1 View  Result Date: 11/06/2018 CLINICAL DATA:  Shortness of breath, cough EXAM: PORTABLE CHEST 1 VIEW COMPARISON:  11/05/2018 FINDINGS: Cardiomegaly. Bilateral perihilar and lower lobe opacities, with interstitial prominence. Findings likely reflect edema/CHF. No visible significant effusions or acute bony abnormality. IMPRESSION: Mild to moderate CHF. Electronically Signed   By: Charlett Nose M.D.   On: 11/06/2018 01:01   Dg Chest Portable 1 View  Result Date: 11/04/2018 CLINICAL DATA:  Bilateral leg swelling, shortness of breath EXAM: PORTABLE CHEST 1 VIEW COMPARISON:  10/26/2018 FINDINGS: Cardiomegaly with vascular congestion. Interstitial prominence likely reflects early interstitial edema. Right base atelectasis. No significant effusions or acute bony abnormality. IMPRESSION: Cardiomegaly with vascular congestion and probable early interstitial edema. Electronically Signed   By: Charlett Nose M.D.   On: 11/04/2018 03:09    Procedures Procedures   Medications Ordered in ED Medications  furosemide (LASIX) injection 40 mg (40 mg Intravenous Given 11/06/18 0112)  naphazoline-pheniramine (NAPHCON-A) 0.025-0.3 % ophthalmic solution 1 drop (1 drop Both Eyes Given 11/06/18 0133)     Initial Impression / Assessment and Plan / ED Course  I have reviewed the triage vital signs and the nursing notes.  Pertinent labs & imaging results that  were available during my care of the patient were reviewed by me and considered in my medical decision making (see chart for details).  Shortness of breath which is secondary to combined systolic and diastolic heart failure. He will be given parenteral furosemide. Conjunctival injection, appears to be allergic - will start him on topical antihistamines. Old records are reviewed, and he had a virtually identical ED visit yesterday. He has had 60 ED visits in the last six months (  also 13 hospital admissions - most recently April 18 -20 ).  He had good diuresis with furosemide.  Following this, he was taken off of nasal oxygen and was maintaining adequate oxygen saturations on room air.  He was feeling much better.  Labs show stable anemia.  BNP is actually decreasing although still quite high.  He is given a new prescription for furosemide and urged to get it filled.  He is allowed to take on the rest of the bottle of Naphcon-A which was used in the ED and is advised to use that 4 times a day.  Return precautions discussed.  Final Clinical Impressions(s) / ED Diagnoses   Final diagnoses:  None    ED Discharge Orders    None       Dione Booze, MD 11/06/18 [phone]

## 2018-11-07 ENCOUNTER — Emergency Department (HOSPITAL_COMMUNITY): Payer: Medicare Other

## 2018-11-07 ENCOUNTER — Inpatient Hospital Stay (HOSPITAL_COMMUNITY): Payer: Medicare Other

## 2018-11-07 ENCOUNTER — Other Ambulatory Visit: Payer: Self-pay

## 2018-11-07 ENCOUNTER — Inpatient Hospital Stay (HOSPITAL_COMMUNITY)
Admission: EM | Admit: 2018-11-07 | Discharge: 2018-11-09 | DRG: 291 | Discharge status: Against Medical Advice | Payer: Medicare Other | Attending: Internal Medicine | Admitting: Internal Medicine

## 2018-11-07 DIAGNOSIS — L8990 Pressure ulcer of unspecified site, unspecified stage: Secondary | ICD-10-CM

## 2018-11-07 DIAGNOSIS — F1420 Cocaine dependence, uncomplicated: Secondary | ICD-10-CM

## 2018-11-07 DIAGNOSIS — Z794 Long term (current) use of insulin: Secondary | ICD-10-CM

## 2018-11-07 DIAGNOSIS — N5089 Other specified disorders of the male genital organs: Secondary | ICD-10-CM | POA: Diagnosis present

## 2018-11-07 DIAGNOSIS — B1920 Unspecified viral hepatitis C without hepatic coma: Secondary | ICD-10-CM | POA: Diagnosis present

## 2018-11-07 DIAGNOSIS — E669 Obesity, unspecified: Secondary | ICD-10-CM | POA: Diagnosis present

## 2018-11-07 DIAGNOSIS — L89212 Pressure ulcer of right hip, stage 2: Secondary | ICD-10-CM | POA: Diagnosis present

## 2018-11-07 DIAGNOSIS — I351 Nonrheumatic aortic (valve) insufficiency: Secondary | ICD-10-CM

## 2018-11-07 DIAGNOSIS — G4733 Obstructive sleep apnea (adult) (pediatric): Secondary | ICD-10-CM | POA: Diagnosis present

## 2018-11-07 DIAGNOSIS — Z20828 Contact with and (suspected) exposure to other viral communicable diseases: Secondary | ICD-10-CM | POA: Diagnosis present

## 2018-11-07 DIAGNOSIS — I10 Essential (primary) hypertension: Secondary | ICD-10-CM | POA: Diagnosis not present

## 2018-11-07 DIAGNOSIS — Z5900 Homelessness unspecified: Secondary | ICD-10-CM

## 2018-11-07 DIAGNOSIS — Z9119 Patient's noncompliance with other medical treatment and regimen: Secondary | ICD-10-CM

## 2018-11-07 DIAGNOSIS — Z720 Tobacco use: Secondary | ICD-10-CM | POA: Diagnosis present

## 2018-11-07 DIAGNOSIS — F200 Paranoid schizophrenia: Secondary | ICD-10-CM | POA: Diagnosis present

## 2018-11-07 DIAGNOSIS — Z833 Family history of diabetes mellitus: Secondary | ICD-10-CM | POA: Diagnosis not present

## 2018-11-07 DIAGNOSIS — G8929 Other chronic pain: Secondary | ICD-10-CM | POA: Diagnosis present

## 2018-11-07 DIAGNOSIS — F4325 Adjustment disorder with mixed disturbance of emotions and conduct: Secondary | ICD-10-CM | POA: Diagnosis present

## 2018-11-07 DIAGNOSIS — I110 Hypertensive heart disease with heart failure: Principal | ICD-10-CM | POA: Diagnosis present

## 2018-11-07 DIAGNOSIS — F419 Anxiety disorder, unspecified: Secondary | ICD-10-CM | POA: Diagnosis not present

## 2018-11-07 DIAGNOSIS — Z6832 Body mass index (BMI) 32.0-32.9, adult: Secondary | ICD-10-CM

## 2018-11-07 DIAGNOSIS — Z79899 Other long term (current) drug therapy: Secondary | ICD-10-CM

## 2018-11-07 DIAGNOSIS — I5033 Acute on chronic diastolic (congestive) heart failure: Secondary | ICD-10-CM

## 2018-11-07 DIAGNOSIS — F14229 Cocaine dependence with intoxication, unspecified: Secondary | ICD-10-CM | POA: Diagnosis present

## 2018-11-07 DIAGNOSIS — Z66 Do not resuscitate: Secondary | ICD-10-CM | POA: Diagnosis present

## 2018-11-07 DIAGNOSIS — I5043 Acute on chronic combined systolic (congestive) and diastolic (congestive) heart failure: Secondary | ICD-10-CM | POA: Diagnosis present

## 2018-11-07 DIAGNOSIS — L89152 Pressure ulcer of sacral region, stage 2: Secondary | ICD-10-CM | POA: Diagnosis present

## 2018-11-07 DIAGNOSIS — G4730 Sleep apnea, unspecified: Secondary | ICD-10-CM

## 2018-11-07 DIAGNOSIS — R7989 Other specified abnormal findings of blood chemistry: Secondary | ICD-10-CM | POA: Diagnosis present

## 2018-11-07 DIAGNOSIS — E7800 Pure hypercholesterolemia, unspecified: Secondary | ICD-10-CM

## 2018-11-07 DIAGNOSIS — E785 Hyperlipidemia, unspecified: Secondary | ICD-10-CM | POA: Diagnosis present

## 2018-11-07 DIAGNOSIS — Z8249 Family history of ischemic heart disease and other diseases of the circulatory system: Secondary | ICD-10-CM

## 2018-11-07 DIAGNOSIS — I361 Nonrheumatic tricuspid (valve) insufficiency: Secondary | ICD-10-CM

## 2018-11-07 DIAGNOSIS — N4822 Cellulitis of corpus cavernosum and penis: Secondary | ICD-10-CM | POA: Diagnosis present

## 2018-11-07 DIAGNOSIS — F17210 Nicotine dependence, cigarettes, uncomplicated: Secondary | ICD-10-CM | POA: Diagnosis present

## 2018-11-07 DIAGNOSIS — M79673 Pain in unspecified foot: Secondary | ICD-10-CM | POA: Diagnosis present

## 2018-11-07 DIAGNOSIS — H5789 Other specified disorders of eye and adnexa: Secondary | ICD-10-CM | POA: Diagnosis present

## 2018-11-07 DIAGNOSIS — Z888 Allergy status to other drugs, medicaments and biological substances status: Secondary | ICD-10-CM

## 2018-11-07 DIAGNOSIS — R778 Other specified abnormalities of plasma proteins: Secondary | ICD-10-CM

## 2018-11-07 DIAGNOSIS — J9601 Acute respiratory failure with hypoxia: Secondary | ICD-10-CM | POA: Diagnosis present

## 2018-11-07 DIAGNOSIS — E119 Type 2 diabetes mellitus without complications: Secondary | ICD-10-CM | POA: Diagnosis present

## 2018-11-07 DIAGNOSIS — Z7982 Long term (current) use of aspirin: Secondary | ICD-10-CM

## 2018-11-07 LAB — BASIC METABOLIC PANEL
Anion gap: 15 (ref 5–15)
BUN: 23 mg/dL — ABNORMAL HIGH (ref 6–20)
CO2: 29 mmol/L (ref 22–32)
Calcium: 8.8 mg/dL — ABNORMAL LOW (ref 8.9–10.3)
Chloride: 96 mmol/L — ABNORMAL LOW (ref 98–111)
Creatinine, Ser: 1.03 mg/dL (ref 0.61–1.24)
GFR calc Af Amer: >60 mL/min (ref >60)
GFR calc non Af Amer: >60 mL/min (ref >60)
Glucose, Bld: 310 mg/dL — ABNORMAL HIGH (ref 70–99)
Potassium: 3 mmol/L — ABNORMAL LOW (ref 3.5–5.1)
Sodium: 140 mmol/L (ref 135–145)

## 2018-11-07 LAB — TROPONIN I
Troponin I: 0.04 ng/mL (ref <0.03)
Troponin I: 0.04 ng/mL (ref <0.03)

## 2018-11-07 LAB — ECHOCARDIOGRAM COMPLETE
Height: 71 in
Weight: 3439.99 oz

## 2018-11-07 LAB — CBC
HCT: 36.3 % — ABNORMAL LOW (ref 39.0–52.0)
HCT: 33 % — ABNORMAL LOW (ref 39.0–52.0)
Hemoglobin: 10.6 g/dL — ABNORMAL LOW (ref 13.0–17.0)
Hemoglobin: 9.7 g/dL — ABNORMAL LOW (ref 13.0–17.0)
MCH: 24.4 pg — ABNORMAL LOW (ref 26.0–34.0)
MCH: 24.6 pg — ABNORMAL LOW (ref 26.0–34.0)
MCHC: 29.2 g/dL — ABNORMAL LOW (ref 30.0–36.0)
MCHC: 29.4 g/dL — ABNORMAL LOW (ref 30.0–36.0)
MCV: 83.4 fL (ref 80.0–100.0)
MCV: 83.5 fL (ref 80.0–100.0)
Platelets: 283 10*3/uL (ref 150–400)
Platelets: 250 10*3/uL (ref 150–400)
RBC: 4.35 MIL/uL (ref 4.22–5.81)
RBC: 3.95 MIL/uL — ABNORMAL LOW (ref 4.22–5.81)
RDW: 20.4 % — ABNORMAL HIGH (ref 11.5–15.5)
RDW: 19.9 % — ABNORMAL HIGH (ref 11.5–15.5)
WBC: 9.5 10*3/uL (ref 4.0–10.5)
WBC: 8.2 10*3/uL (ref 4.0–10.5)
nRBC: 0 % (ref 0.0–0.2)
nRBC: 0 % (ref 0.0–0.2)

## 2018-11-07 LAB — GLUCOSE, CAPILLARY
Glucose-Capillary: 139 mg/dL — ABNORMAL HIGH (ref 70–99)
Glucose-Capillary: 184 mg/dL — ABNORMAL HIGH (ref 70–99)
Glucose-Capillary: 259 mg/dL — ABNORMAL HIGH (ref 70–99)

## 2018-11-07 LAB — CREATININE, SERUM
Creatinine, Ser: 0.92 mg/dL (ref 0.61–1.24)
GFR calc Af Amer: >60 mL/min (ref >60)
GFR calc non Af Amer: >60 mL/min (ref >60)

## 2018-11-07 LAB — SARS CORONAVIRUS 2 BY RT PCR (HOSPITAL ORDER, PERFORMED IN ~~LOC~~ HOSPITAL LAB): SARS Coronavirus 2: NEGATIVE

## 2018-11-07 LAB — RAPID URINE DRUG SCREEN, HOSP PERFORMED
Amphetamines: NOT DETECTED
Barbiturates: NOT DETECTED
Benzodiazepines: NOT DETECTED
Cocaine: POSITIVE — AB
Opiates: NOT DETECTED
Tetrahydrocannabinol: NOT DETECTED

## 2018-11-07 LAB — BRAIN NATRIURETIC PEPTIDE: B Natriuretic Peptide: 1280.4 pg/mL — ABNORMAL HIGH (ref 0.0–100.0)

## 2018-11-07 LAB — MRSA PCR SCREENING: MRSA by PCR: POSITIVE — AB

## 2018-11-07 LAB — ETHANOL: Alcohol, Ethyl (B): <10 mg/dL (ref <10)

## 2018-11-07 LAB — MAGNESIUM: Magnesium: 1.9 mg/dL (ref 1.7–2.4)

## 2018-11-07 MED ORDER — POTASSIUM CHLORIDE CRYS ER 20 MEQ PO TBCR
30.0000 meq | EXTENDED_RELEASE_TABLET | Freq: Once | ORAL | Status: AC
  Administered 2018-11-07: 30 meq via ORAL
  Filled 2018-11-07: qty 1

## 2018-11-07 MED ORDER — CARVEDILOL 6.25 MG PO TABS
6.2500 mg | ORAL_TABLET | Freq: Two times a day (BID) | ORAL | Status: DC
  Administered 2018-11-07 – 2018-11-09 (×4): 6.25 mg via ORAL
  Filled 2018-11-07 (×4): qty 1

## 2018-11-07 MED ORDER — VANCOMYCIN HCL 10 G IV SOLR
2000.0000 mg | Freq: Once | INTRAVENOUS | Status: AC
  Administered 2018-11-07: 14:00:00 2000 mg via INTRAVENOUS
  Filled 2018-11-07: qty 2000

## 2018-11-07 MED ORDER — ATORVASTATIN CALCIUM 40 MG PO TABS
80.0000 mg | ORAL_TABLET | Freq: Every day | ORAL | Status: DC
  Administered 2018-11-07 – 2018-11-08 (×2): 80 mg via ORAL
  Filled 2018-11-07 (×2): qty 2

## 2018-11-07 MED ORDER — FUROSEMIDE 10 MG/ML IJ SOLN
40.0000 mg | Freq: Once | INTRAMUSCULAR | Status: AC
  Administered 2018-11-07: 40 mg via INTRAVENOUS
  Filled 2018-11-07: qty 4

## 2018-11-07 MED ORDER — POTASSIUM CHLORIDE 10 MEQ/100ML IV SOLN
10.0000 meq | Freq: Once | INTRAVENOUS | Status: AC
  Administered 2018-11-07: 10 meq via INTRAVENOUS
  Filled 2018-11-07: qty 100

## 2018-11-07 MED ORDER — VANCOMYCIN HCL 10 G IV SOLR
1250.0000 mg | Freq: Two times a day (BID) | INTRAVENOUS | Status: DC
  Administered 2018-11-08: 1250 mg via INTRAVENOUS
  Filled 2018-11-07 (×3): qty 1250

## 2018-11-07 MED ORDER — ENOXAPARIN SODIUM 40 MG/0.4ML ~~LOC~~ SOLN
40.0000 mg | SUBCUTANEOUS | Status: DC
  Administered 2018-11-07: 40 mg via SUBCUTANEOUS
  Filled 2018-11-07: qty 0.4

## 2018-11-07 MED ORDER — ARIPIPRAZOLE 10 MG PO TABS
10.0000 mg | ORAL_TABLET | Freq: Every day | ORAL | Status: DC
  Administered 2018-11-07 – 2018-11-09 (×3): 10 mg via ORAL
  Filled 2018-11-07 (×3): qty 1

## 2018-11-07 MED ORDER — INSULIN ASPART 100 UNIT/ML ~~LOC~~ SOLN
0.0000 [IU] | SUBCUTANEOUS | Status: DC
  Administered 2018-11-07: 2 [IU] via SUBCUTANEOUS
  Administered 2018-11-07: 8 [IU] via SUBCUTANEOUS
  Administered 2018-11-08: 3 [IU] via SUBCUTANEOUS
  Administered 2018-11-08 (×2): 5 [IU] via SUBCUTANEOUS
  Administered 2018-11-08: 11 [IU] via SUBCUTANEOUS
  Administered 2018-11-08: 5 [IU] via SUBCUTANEOUS
  Administered 2018-11-09: 15 [IU] via SUBCUTANEOUS
  Administered 2018-11-09: 3 [IU] via SUBCUTANEOUS
  Administered 2018-11-09: 2 [IU] via SUBCUTANEOUS
  Administered 2018-11-09: 3 [IU] via SUBCUTANEOUS

## 2018-11-07 MED ORDER — CARBAMAZEPINE 200 MG PO TABS
100.0000 mg | ORAL_TABLET | Freq: Two times a day (BID) | ORAL | Status: DC
  Administered 2018-11-07 – 2018-11-09 (×5): 100 mg via ORAL
  Filled 2018-11-07 (×5): qty 0.5

## 2018-11-07 MED ORDER — FUROSEMIDE 10 MG/ML IJ SOLN
80.0000 mg | Freq: Three times a day (TID) | INTRAMUSCULAR | Status: DC
  Administered 2018-11-07 – 2018-11-09 (×6): 80 mg via INTRAVENOUS
  Filled 2018-11-07 (×6): qty 8

## 2018-11-07 MED ORDER — ACETAMINOPHEN 325 MG PO TABS
650.0000 mg | ORAL_TABLET | ORAL | Status: DC | PRN
  Administered 2018-11-08: 650 mg via ORAL
  Filled 2018-11-07: qty 2

## 2018-11-07 MED ORDER — FUROSEMIDE 10 MG/ML IJ SOLN: 80.0000 mg | Freq: Three times a day (TID) | INTRAMUSCULAR | Status: DC

## 2018-11-07 MED ORDER — CHLORHEXIDINE GLUCONATE CLOTH 2 % EX PADS
6.0000 | MEDICATED_PAD | Freq: Every day | CUTANEOUS | Status: DC
  Administered 2018-11-07 – 2018-11-09 (×2): 6 via TOPICAL

## 2018-11-07 MED ORDER — SODIUM CHLORIDE 0.9% FLUSH
3.0000 mL | Freq: Two times a day (BID) | INTRAVENOUS | Status: DC
  Administered 2018-11-07 – 2018-11-09 (×4): 3 mL via INTRAVENOUS

## 2018-11-07 MED ORDER — SODIUM CHLORIDE 0.9 % IV SOLN: 250.0000 mL | INTRAVENOUS | Status: DC | PRN

## 2018-11-07 MED ORDER — ONDANSETRON HCL 4 MG/2ML IJ SOLN: 4.0000 mg | Freq: Four times a day (QID) | INTRAMUSCULAR | Status: DC | PRN

## 2018-11-07 MED ORDER — TAMSULOSIN HCL 0.4 MG PO CAPS
0.4000 mg | ORAL_CAPSULE | Freq: Every day | ORAL | Status: DC
  Administered 2018-11-08 – 2018-11-09 (×2): 0.4 mg via ORAL
  Filled 2018-11-07 (×2): qty 1

## 2018-11-07 MED ORDER — NICOTINE 21 MG/24HR TD PT24
21.0000 mg | MEDICATED_PATCH | Freq: Every day | TRANSDERMAL | Status: DC
  Administered 2018-11-07 – 2018-11-08 (×2): 21 mg via TRANSDERMAL
  Filled 2018-11-07 (×2): qty 1

## 2018-11-07 MED ORDER — ACETAMINOPHEN 325 MG PO TABS
650.0000 mg | ORAL_TABLET | Freq: Once | ORAL | Status: AC
  Administered 2018-11-07: 650 mg via ORAL
  Filled 2018-11-07: qty 2

## 2018-11-07 MED ORDER — SODIUM CHLORIDE 0.9% FLUSH: 3.0000 mL | INTRAVENOUS | Status: DC | PRN

## 2018-11-07 MED ORDER — ASPIRIN EC 81 MG PO TBEC
81.0000 mg | DELAYED_RELEASE_TABLET | Freq: Every day | ORAL | Status: DC
  Administered 2018-11-07 – 2018-11-09 (×3): 81 mg via ORAL
  Filled 2018-11-07 (×3): qty 1

## 2018-11-07 MED ORDER — MUPIROCIN 2 % EX OINT
1.0000 "application " | TOPICAL_OINTMENT | Freq: Two times a day (BID) | CUTANEOUS | Status: DC
  Administered 2018-11-07 – 2018-11-09 (×4): 1 via NASAL
  Filled 2018-11-07 (×2): qty 22

## 2018-11-07 NOTE — ED Notes (Signed)
Report given to receiving RN.

## 2018-11-07 NOTE — ED Triage Notes (Signed)
Pt came to the ED with complaints of bilateral legs swelling, testicle pain and weakness.

## 2018-11-07 NOTE — H&P (Addendum)
[facility] History and Physical  [name] GNF:[id number] DOB: Nov 18, 1960 DOA: 11/07/2018  Referring physician:  PCP: Patient, No Pcp Per   Chief Complaint: Acute on chronic systolic and diastolic CHF  HPI: [name] is a 58 y.o. BM PMHx  Schizophrenia paranoid type, adjustment disorder with mixed disturbance of emotions and conduct, homelessness, HTN, polysubstance abuse, OSA, chronic systolic and diastolic CHF, and penile cellulitis, hepatitis C?, Diabetes type 2 uncontrolled with complication, chronic foot pain, tobacco abuse, noncompliant  Presents to the emergency department by EMS with a chief complaint of bilateral lower extremity swelling.  He reports associated testicular swelling and generalized weakness.  Also complains of penis pain/tenderness that has been going on for some time. He denies chest pain, shortness of breath, fever, chills, cough, nausea, vomiting, diarrhea. He states "you need to just admit me. They take good care of me when you admit me. I need to get admitted so they can admit me to a group home. The lady from jail said I need to be in a group home."   Patient is well-known to this ER with 61 visits and 13 admissions in the last 6 months.  He is denying penile pain or discharge, redness or warmth to the bilateral lower extremities at this time.  He is frequently given a prescription for Lasix in the ER, but is poorly compliant with the medication.  He also smokes crack, last use was yesterday. He is requesting Tylenol of leg pain.      Review of Systems:  Constitutional:  No weight loss, night sweats, Fevers, chills, fatigue.  HEENT:  No headaches, Difficulty swallowing, positive tooth/dental problems,Sore throat,  No sneezing, itching, ear ache, nasal congestion, post nasal drip,  Cardio-vascular:  No chest pain, Orthopnea, PND, positive swelling in lower extremities, anasarca, negative dizziness, palpitations  GI:  No heartburn,  indigestion, abdominal pain, nausea, vomiting, diarrhea, change in bowel habits, loss of appetite  Resp:  positive  shortness of breath with exertion or at rest. No excess mucus, no productive cough, No non-productive cough, No coughing up of blood.No change in color of mucus.No wheezing.No chest wall deformity  Skin:  no rash or lesions.  GU:  no dysuria, change in color of urine, no urgency or frequency. No flank pain.  Musculoskeletal:  No joint pain or swelling. No decreased range of motion. No back pain.  Psych:  No change in mood or affect. No depression or anxiety. No memory loss. positive agitation  Past Medical History:  Diagnosis Date   Chronic foot pain    Cocaine abuse (HCC)    Diabetes mellitus without complication (HCC)    Hepatitis C    unsure    Homelessness    Hypertension    Neuropathy    Polysubstance abuse (HCC)    Schizophrenia (HCC)    Sleep apnea    Systolic and diastolic CHF, chronic (HCC)    Past Surgical History:  Procedure Laterality Date   MULTIPLE TOOTH EXTRACTIONS     Social History:  reports that he has been smoking cigarettes. He has a 20.00 pack-year smoking history. He uses smokeless tobacco. He reports current alcohol use. He reports current drug use. Frequency: 7.00 times per week. Drugs: "Crack" cocaine, Cocaine, and Marijuana.  Allergies  Allergen Reactions   Haldol [Haloperidol] Other (See Comments)    Muscle spasms, loss of voluntary movement. However, pt has taken Thorazine on multiple occasions with no adverse effects.     Family History  Problem Relation Age of Onset   Hypertension Other    Diabetes Other      Prior to Admission medications   Medication Sig Start Date End Date Taking? Authorizing Provider  ARIPiprazole (ABILIFY) 10 MG tablet Take 1 tablet (10 mg total) by mouth daily. 09/18/18   Meredeth Ide, MD  aspirin EC 81 MG tablet Take 1 tablet (81 mg total) by mouth daily. 10/19/18 02/16/19  Meredeth Ide,  MD  atorvastatin (LIPITOR) 40 MG tablet Take 1 tablet (40 mg total) by mouth daily. 09/18/18   Meredeth Ide, MD  bisacodyl (DULCOLAX) 5 MG EC tablet Take 1 tablet (5 mg total) by mouth daily as needed for moderate constipation. 09/04/18   Rodolph Bong, MD  carbamazepine (TEGRETOL) 200 MG tablet Take 0.5 tablets (100 mg total) by mouth 2 (two) times daily. 09/18/18   Meredeth Ide, MD  carvedilol (COREG) 6.25 MG tablet Take 1 tablet (6.25 mg total) by mouth 2 (two) times daily with a meal. 10/19/18   Sharl Ma, Sarina Ill, MD  cephALEXin (KEFLEX) 500 MG capsule Take 1 capsule (500 mg total) by mouth 4 (four) times daily. 10/22/18   Roxy Horseman, PA-C  furosemide (LASIX) 40 MG tablet Take 1 tablet (40 mg total) by mouth daily. 11/06/18   Dione Booze, MD  hydrocortisone (PROCTO-PAK) 1 % CREA Apply twice a day to hemorrhoids. 09/29/18   Renne Crigler, PA-C  Insulin Glargine (LANTUS) 100 UNIT/ML Solostar Pen Inject 14 Units into the skin daily. 09/18/18   Meredeth Ide, MD  Insulin Pen Needle 29G X MISC Use with insulin 09/18/18   Meredeth Ide, MD  lisinopril (PRINIVIL,ZESTRIL) 10 MG tablet Take 1 tablet (10 mg total) by mouth daily. 09/18/18   Meredeth Ide, MD  nicotine (NICODERM CQ - DOSED IN MG/24 HOURS) 21 mg/24hr patch Place 1 patch (21 mg total) onto the skin daily. 09/04/18   Rodolph Bong, MD  polyethylene glycol [facility] / Ethelene Hal) packet Take 17 g by mouth 2 (two) times daily. 09/18/18   Meredeth Ide, MD  Potassium Chloride ER 20 MEQ TBCR Take 20 mEq by mouth daily. 09/18/18   Meredeth Ide, MD  senna-docusate (SENOKOT-S) 8.6-50 MG tablet Take 1 tablet by mouth 2 (two) times daily. 09/04/18   Rodolph Bong, MD  tamsulosin (FLOMAX) 0.4 MG CAPS capsule Take 1 capsule (0.4 mg total) by mouth daily after breakfast. 10/05/18   Narda Bonds, MD     Consultants:  None    Procedures/Significant Events:  09/02/2018 echocardiogram; LVEF 25-30%, moderate LVH, severe global hypokinesis,    -Grade 3 DD, - moderate RV systolic dysfunction with RVH, -Severe biatrial enlargement,  -MR/TR moderate to severe  - RVSP 41 mmHg, dilated IVC -Left Atrium/RIGHT atrium: severely dilated -he inferior vena cava measures 2.76 cm, is dilated in size with less than 50% respiratory variability.       I have personally reviewed and interpreted all radiology studies and my findings are as above.   VENTILATOR SETTINGS:    Cultures   Antimicrobials:    Devices    LINES / TUBES:      Continuous Infusions:  Physical Exam: Vitals:   11/07/18 0700 11/07/18 0800 11/07/18 0830 11/07/18 1030  BP: 108/77 (!) 157/89 (!) 154/78 (!) 171/107  Pulse: 94 88 (!) 103 75  Resp: 17 19 19 19   Temp: 98 F (36.7 C)     TempSrc:      SpO2:  98% 91% 94% 94%  Weight:      Height:        Wt Readings from Last 3 Encounters:  11/07/18 97.5 kg  11/06/18 97.5 kg  10/30/18 97.5 kg    General: A/O x4, positive acute respiratory distress Eyes: negative scleral hemorrhage, negative anisocoria, negative icterus ENT: Negative Runny nose, negative gingival bleeding, Neck:  Negative scars, masses, torticollis, lymphadenopathy, JVD Lungs: Clear to auscultation bilaterally without wheezes or crackles Cardiovascular: Regular rate and rhythm without murmur gallop or rub normal S1 and S2 Abdomen: negative abdominal pain,positive distended (anasarca), positive soft, bowel sounds, no rebound, positive  ascites, no appreciable mass Extremities: positive anasarca Skin: Penis is swollen, painful to palpation, erythematous, expressed small amount of discharge foul-smelling, yellowish from head.  Possible herpetic lesions (old). Psychiatric:  Negative depression, positive  anxiety, negative fatigue, negative mania  Central nervous system:  Cranial nerves II through XII intact, tongue/uvula midline, all extremities muscle strength 5/5, sensation intact throughout,  negative dysarthria, negative expressive  aphasia, negative receptive aphasia.        Labs on Admission:  Basic Metabolic Panel: Recent Labs  Lab 11/04/18 0241 11/05/18 1844 11/06/18 0052 11/07/18 0435 11/07/18 0714  NA 139 142 137 140  --   K 3.4* 3.5 3.6 3.0*  --   CL 103 103 104 96*  --   CO2 26 31 26 29   --   GLUCOSE 253* 199* 317* 310*  --   BUN 18 18 20  23*  --   CREATININE 1.09 1.01 0.98 1.03  --   CALCIUM 8.5* 8.8* 8.5* 8.8*  --   MG  --   --  1.9  --  1.9   Liver Function Tests: No results for input(s): AST, ALT, ALKPHOS, BILITOT, PROT, ALBUMIN in the last 168 hours. No results for input(s): LIPASE, AMYLASE in the last 168 hours. No results for input(s): AMMONIA in the last 168 hours. CBC: Recent Labs  Lab 11/04/18 0241 11/05/18 1844 11/06/18 0052 11/07/18 0435  WBC 7.1 8.0 7.4 9.5  NEUTROABS 5.4 5.8 5.8  --   HGB 9.8* 9.3* 9.4* 10.6*  HCT 32.6* 32.3* 32.7* 36.3*  MCV 81.7 83.9 85.4 83.4  PLT 268 256 227 283   Cardiac Enzymes: Recent Labs  Lab 11/07/18 0435 11/07/18 0714  TROPONINI 0.04* 0.04*    BNP (last 3 results) Recent Labs    11/05/18 1844 11/06/18 0052 11/07/18 0435  BNP 1,228.9* 993.1* 1,280.4*    ProBNP (last 3 results) No results for input(s): PROBNP in the last 8760 hours.  CBG: No results for input(s): GLUCAP in the last 168 hours.  Radiological Exams on Admission: Dg Chest 2 View  Result Date: 11/05/2018 CLINICAL DATA:  Shortness of breath EXAM: CHEST - 2 VIEW COMPARISON:  11/04/2018, 10/26/2018, 10/17/2018 FINDINGS: Cardiomegaly with vascular congestion and mild diffuse interstitial process, likely edema. Small bilateral pleural effusions. No pneumothorax. Linear atelectasis or scar in the left mid lung. IMPRESSION: Cardiomegaly with vascular congestion, mild interstitial edema and small bilateral pleural effusions. Electronically Signed   By: Jasmine PangKim  Fujinaga M.D.   On: 11/05/2018 17:57   Dg Chest Portable 1 View  Result Date: 11/07/2018 CLINICAL DATA:  Pt came to the  ED with complaints of bilateral legs swelling, testicle pain and weakness PT HX: current smoker, DM, HTNleft swelling, shortness of breath; h/o of CHF EXAM: PORTABLE CHEST 1 VIEW COMPARISON:  11/06/2018 FINDINGS: Stable enlarged cardiac silhouette. No effusion, infiltrate pneumothorax. Improvement in central venous congestion pattern  seen on comparison exam. IMPRESSION: Stable cardiomegaly. Improvement in central venous pulmonary congestion Electronically Signed   By: Genevive Bi M.D.   On: 11/07/2018 05:12   Dg Chest Port 1 View  Result Date: 11/06/2018 CLINICAL DATA:  Shortness of breath, cough EXAM: PORTABLE CHEST 1 VIEW COMPARISON:  11/05/2018 FINDINGS: Cardiomegaly. Bilateral perihilar and lower lobe opacities, with interstitial prominence. Findings likely reflect edema/CHF. No visible significant effusions or acute bony abnormality. IMPRESSION: Mild to moderate CHF. Electronically Signed   By: Charlett Nose M.D.   On: 11/06/2018 01:01    EKG: Independently reviewed.  Sinus tachycardia, probable left atrial enlargement, anterior infarct indeterminate age  Assessment/Plan Active Problems:   Insulin-requiring or dependent type II diabetes mellitus (HCC)   Essential hypertension, benign   Cocaine use disorder, severe, dependence (HCC)   Chronic paranoid schizophrenia (HCC)   Schizophrenia, paranoid type (HCC)   Chronic foot pain   Acute on chronic combined systolic (congestive) and diastolic (congestive) heart failure (HCC)   Homelessness   Tobacco use   Acute respiratory failure with hypoxia (HCC)   Sleep apnea   Adjustment disorder with mixed disturbance of emotions and conduct   Penile cellulitis   HLD (hyperlipidemia)   Anxiety   Acute on chronic systolic and diastolic CHF - Patient continues to smoke crack cocaine per patient last use yesterday.  Echocardiogram pending to evaluate for worsening CHF. -Strict in and out -Daily weight -Coreg 6.25 mg twice daily -Lasix IV 80 mg  TID - Lisinopril 10 mg daily (hold), while we aggressively attempt to diuresis patient.  Essential HTN - See CHF  Acute respiratory failure with hypoxia - Titrate O2 to maintain SPO2 89 to 93%  OSA -CPAP per respiratory  Diabetes type 2 uncontrolled with complication - 4/19 hemoglobin A1c= 10.2 - Moderate SSI  HLD - 3/5 LDL not within ADA/AHA guidelines - Increase Lipitor 80 mg daily  Penis cellulitis vs STD - GC/RPR panel pending - HSV panel pending - Patient has been on Keflex since 4/23 without resolution. - Start vancomycin (have low bar for discontinuing) looks more like STD  Schizophrenia/Adjustment DO with mixed disturbance of Emotions and Conduct  - Abilify 10 mg daily - Carbamazepine 100 mg twice daily  Tobacco abuse - Nicotine patch   Goals of care - 5/9 social work consult placed: Patient homeless, difficulty obtaining medication evaluate for resources available to help compliance    Code Status: DNR (DVT Prophylaxis: Lovenox Family Communication: None Disposition Plan: TBD   Data Reviewed: Care during the described time interval was provided by me .  I have reviewed this patient's available data, including medical history, events of note, physical examination, and all test results as part of my evaluation.   The patient is critically ill with multiple organ systems failure and requires high complexity decision making for assessment and support, frequent evaluation and titration of therapies, application of advanced monitoring technologies and extensive interpretation of multiple databases. Critical Care Time devoted to patient care services described in this note  Time spent: 70 minutes   Shantella Blubaugh, Roselind Messier [facility] Pager [phone]

## 2018-11-07 NOTE — Progress Notes (Signed)
Pharmacy Antibiotic Note  [name] is a 58 y.o. male admitted on 11/07/2018 with cellulitis.  Pharmacy has been consulted for vancomycin dosing.  Today, 11/07/18 -WBC WNL -SCr 0.9, CrCl ~ 100 mL/min  Plan:  Vancomycin 2000 mg LD given in ED  Will order vancomycin 1250 mg IV q12h for estimated AUC 466  Goal AUC 400-550  Monitor renal function and culture data  Monitor vancomycin levels once at steady state if indicated  Height: 5\' 11"  (180.3 cm) Weight: 215 lb (97.5 kg) IBW/kg (Calculated) : 75.3  Temp (24hrs), Avg:97.9 F (36.6 C), Min:97.6 F (36.4 C), Max:98.2 F (36.8 C)  Recent Labs  Lab 11/04/18 0241 11/05/18 1844 11/06/18 0052 11/07/18 0435 11/07/18 1203  WBC 7.1 8.0 7.4 9.5 8.2  CREATININE 1.09 1.01 0.98 1.03 0.92    Estimated Creatinine Clearance: 105.5 mL/min (by C-G formula based on SCr of 0.92 mg/dL).    Allergies  Allergen Reactions  . Haldol [Haloperidol] Other (See Comments)    Muscle spasms, loss of voluntary movement. However, pt has taken Thorazine on multiple occasions with no adverse effects.     Antimicrobials this admission: Vancomycin 5/9 >  Dose adjustments this admission:  Microbiology results: None  Thank you for allowing pharmacy to be a part of this patient's care.  Cindi Carbon, PharmD 11/07/2018 1:26 PM

## 2018-11-07 NOTE — ED Notes (Signed)
EKG given to EDP,Cardema,MD., for review.

## 2018-11-07 NOTE — ED Provider Notes (Signed)
[facility]-EMERGENCY DEPT Provider Note   CSN: [id number] Arrival date & time: 11/07/18  0409    History   Chief Complaint Chief Complaint  Patient presents with  . Leg Swelling    HPI [name] is a 58 y.o. male with a history of schizophrenia, homelessness, hypertension, polysubstance abuse, chronic systolic and diastolic CHF, and penile cellulitis who presents to the emergency department by EMS with a chief complaint of bilateral lower extremity swelling.  He reports associated testicular swelling and generalized weakness.  He denies chest pain, shortness of breath, fever, chills, cough, nausea, vomiting, diarrhea. He states "you need to just admit me. They take good care of me when you admit me. I need to get admitted so they can admit me to a group home. The lady from jail said I need to be in a group home."  Patient is well-known to this ER with 61 visits and 13 admissions in the last 6 months.  He is denying penile pain or discharge, redness or warmth to the bilateral lower extremities at this time.  He is frequently given a prescription for Lasix in the ER, but is poorly compliant with the medication.  He also smokes crack, last use was yesterday. He is requesting Tylenol of leg pain.        The history is provided by the patient. No language interpreter was used.    Past Medical History:  Diagnosis Date  . Chronic foot pain   . Cocaine abuse (HCC)   . Diabetes mellitus without complication (HCC)   . Hepatitis C    unsure   . Homelessness   . Hypertension   . Neuropathy   . Polysubstance abuse (HCC)   . Schizophrenia (HCC)   . Sleep apnea   . Systolic and diastolic CHF, chronic [facility])     Patient Active Problem List   Diagnosis Date Noted  . Elevated troponin 10/18/2018  . HLD (hyperlipidemia) 10/18/2018  . Anxiety 10/18/2018  . CHF (congestive heart failure), NYHA class II, acute on chronic, combined (HCC) 10/18/2018  .  Hypertensive urgency 10/12/2018  . Mild renal insufficiency 10/12/2018  . Cellulitis 10/12/2018  . Penile cellulitis   . Adjustment disorder with mixed disturbance of emotions and conduct   . Sleep apnea 10/03/2018  . Hypokalemia 09/17/2018  . Scrotal swelling 09/17/2018  . Urinary hesitancy   . Constipation   . Acute on chronic combined systolic and diastolic CHF (congestive heart failure) (HCC) 08/25/2018  . Acute respiratory failure with hypoxia (HCC) 08/25/2018  . Acute on chronic combined systolic and diastolic congestive heart failure (HCC) 08/18/2018  . Tobacco use 08/18/2018  . 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 Homelessness 08/08/2018  . Smoker 08/08/2018  . Acute exacerbation of CHF (congestive heart failure) (HCC) 03/28/2018  . Prostate enlargement 03/16/2018  . Aortic atherosclerosis (HCC) 03/16/2018  . 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 Chronic foot pain   . Schizoaffective disorder, bipolar type (HCC) 09/30/2016  . Substance induced mood disorder (HCC) 03/13/2015  . Acute kidney failure (HCC) 01/26/2015  . Schizophrenia, paranoid type (HCC) 01/17/2015  . Drug hallucinosis (HCC) 10/08/2014  . Chronic paranoid schizophrenia (HCC) 09/07/2014  . 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 Urinary retention   . Cocaine use disorder, severe, dependence (HCC)   . Essential hypertension, benign 03/28/2013  . Insulin-requiring or dependent type II  diabetes mellitus (HCC) 03/15/2013    Past Surgical History:  Procedure Laterality Date  . MULTIPLE TOOTH EXTRACTIONS          Home Medications    Prior to Admission medications   Medication Sig Start Date End Date Taking? Authorizing Provider  ARIPiprazole (ABILIFY) 10 MG tablet Take 1 tablet (10 mg total) by mouth daily. 09/18/18   Meredeth Ide, MD  aspirin EC 81 MG  tablet Take 1 tablet (81 mg total) by mouth daily. 10/19/18 02/16/19  Meredeth Ide, MD  atorvastatin (LIPITOR) 40 MG tablet Take 1 tablet (40 mg total) by mouth daily. 09/18/18   Meredeth Ide, MD  bisacodyl (DULCOLAX) 5 MG EC tablet Take 1 tablet (5 mg total) by mouth daily as needed for moderate constipation. 09/04/18   Rodolph Bong, MD  carbamazepine (TEGRETOL) 200 MG tablet Take 0.5 tablets (100 mg total) by mouth 2 (two) times daily. 09/18/18   Meredeth Ide, MD  carvedilol (COREG) 6.25 MG tablet Take 1 tablet (6.25 mg total) by mouth 2 (two) times daily with a meal. 10/19/18   Sharl Ma, Sarina Ill, MD  cephALEXin (KEFLEX) 500 MG capsule Take 1 capsule (500 mg total) by mouth 4 (four) times daily. 10/22/18   Roxy Horseman, PA-C  furosemide (LASIX) 40 MG tablet Take 1 tablet (40 mg total) by mouth daily. 11/06/18   Dione Booze, MD  hydrocortisone (PROCTO-PAK) 1 % CREA Apply twice a day to hemorrhoids. 09/29/18   Renne Crigler, PA-C  Insulin Glargine (LANTUS) 100 UNIT/ML Solostar Pen Inject 14 Units into the skin daily. 09/18/18   Meredeth Ide, MD  Insulin Pen Needle 29G X MISC Use with insulin 09/18/18   Meredeth Ide, MD  lisinopril (PRINIVIL,ZESTRIL) 10 MG tablet Take 1 tablet (10 mg total) by mouth daily. 09/18/18   Meredeth Ide, MD  nicotine (NICODERM CQ - DOSED IN MG/24 HOURS) 21 mg/24hr patch Place 1 patch (21 mg total) onto the skin daily. 09/04/18   Rodolph Bong, MD  polyethylene glycol [facility] Ethelene Hal) packet Take 17 g by mouth 2 (two) times daily. 09/18/18   Meredeth Ide, MD  Potassium Chloride ER 20 MEQ TBCR Take 20 mEq by mouth daily. 09/18/18   Meredeth Ide, MD  senna-docusate (SENOKOT-S) 8.6-50 MG tablet Take 1 tablet by mouth 2 (two) times daily. 09/04/18   Rodolph Bong, MD  tamsulosin [facility]) 0.4 MG CAPS capsule Take 1 capsule (0.4 mg total) by mouth daily after breakfast. 10/05/18   Narda Bonds, MD    Family History Family History  Problem Relation Age of Onset   . Hypertension Other   . Diabetes Other     Social History Social History   Tobacco Use  . Smoking status: Current Every Day Smoker    Packs/day: 1.00    Years: 20.00    Pack years: 20.00    Types: Cigarettes  . Smokeless tobacco: Current User  Substance Use Topics  . Alcohol use: Yes    Comment: Daily Drinker   . Drug use: Yes    Frequency: 7.0 times per week    Types: "Crack" cocaine, Cocaine, Marijuana     Allergies   Haldol [haloperidol]   Review of Systems Review of Systems  Constitutional: Negative for appetite change and fever.  Respiratory: Negative for cough, shortness of breath and wheezing.   Cardiovascular: Positive for leg swelling. Negative for chest pain and palpitations.  Gastrointestinal: Negative for abdominal  pain, diarrhea, nausea and vomiting.  Genitourinary: Negative for dysuria, penile pain and penile swelling.  Musculoskeletal: Negative for back pain.  Skin: Negative for rash and wound.  Allergic/Immunologic: Negative for immunocompromised state.  Neurological: Positive for numbness. Negative for dizziness, syncope, weakness and headaches.  Psychiatric/Behavioral: Negative for confusion.     Physical Exam Updated Vital Signs BP 108/77   Pulse 94   Temp 98 F (36.7 C)   Resp 17   Ht  (1.803 m)   Wt 97.5 kg   SpO2 98%   BMI 29.99 kg/m   Physical Exam Vitals signs and nursing note reviewed.  Constitutional:      Appearance: He is well-developed. He is not ill-appearing or toxic-appearing.  HENT:     Head: Normocephalic.  Eyes:     Extraocular Movements: Extraocular movements intact.     Conjunctiva/sclera: Conjunctivae normal.     Pupils: Pupils are equal, round, and reactive to light.  Neck:     Musculoskeletal: Neck supple.  Cardiovascular:     Rate and Rhythm: Regular rhythm. Tachycardia present.     Pulses: Normal pulses.     Heart sounds: Normal heart sounds. No murmur. No friction rub. No gallop.   Pulmonary:      Effort: Pulmonary effort is normal.     Comments: Few scattered rales in the bilateral lungs.  No rhonchi or wheezes.  Good effort with inspiration and expiration.  No increased work of breathing. Abdominal:     General: There is no distension.     Palpations: Abdomen is soft. There is no mass.     Tenderness: There is no abdominal tenderness. There is no right CVA tenderness, left CVA tenderness, guarding or rebound.     Hernia: No hernia is present.  Musculoskeletal:     Right lower leg: Edema present.     Left lower leg: Edema present.     Comments: 3+ pitting edema to the bilateral extremities.  No warmth or redness.  He is neurovascularly intact.  Skin:    General: Skin is warm and dry.  Neurological:     Mental Status: He is alert.  Psychiatric:        Behavior: Behavior normal.      ED Treatments / Results  Labs (all labs ordered are listed, but only abnormal results are displayed) Labs Reviewed  BRAIN NATRIURETIC PEPTIDE - Abnormal; Notable for the following components:      Result Value   B Natriuretic Peptide 1,280.4 (*)    All other components within normal limits  CBC - Abnormal; Notable for the following components:   Hemoglobin 10.6 (*)    HCT 36.3 (*)    MCH 24.4 (*)    MCHC 29.2 (*)    RDW 20.4 (*)    All other components within normal limits  TROPONIN I - Abnormal; Notable for the following components:   Troponin I 0.04 (*)    All other components within normal limits  BASIC METABOLIC PANEL - Abnormal; Notable for the following components:   Potassium 3.0 (*)    Chloride 96 (*)    Glucose, Bld 310 (*)    BUN 23 (*)    Calcium 8.8 (*)    All other components within normal limits  TROPONIN I  MAGNESIUM    EKG EKG Interpretation  Date/Time:  Saturday Nov 07 2018 04:33:15 EDT Ventricular Rate:  105 PR Interval:    QRS Duration: 87 QT Interval:  356 QTC Calculation: 471  R Axis:   45 Text Interpretation:  Sinus tachycardia Probable left atrial  enlargement Anterior infarct, old No significant change since last tracing Confirmed by Drema Pry [phone]) on 11/07/2018 5:44:25 AM   Radiology Dg Chest 2 View  Result Date: 11/05/2018 CLINICAL DATA:  Shortness of breath EXAM: CHEST - 2 VIEW COMPARISON:  11/04/2018, 10/26/2018, 10/17/2018 FINDINGS: Cardiomegaly with vascular congestion and mild diffuse interstitial process, likely edema. Small bilateral pleural effusions. No pneumothorax. Linear atelectasis or scar in the left mid lung. IMPRESSION: Cardiomegaly with vascular congestion, mild interstitial edema and small bilateral pleural effusions. Electronically Signed   By: [name]   On: 11/05/2018 17:57   Dg Chest Portable 1 View  Result Date: 11/07/2018 CLINICAL DATA:  Pt came to the ED with complaints of bilateral legs swelling, testicle pain and weakness PT HX: current smoker, DM, HTNleft swelling, shortness of breath; h/o of CHF EXAM: PORTABLE CHEST 1 VIEW COMPARISON:  11/06/2018 FINDINGS: Stable enlarged cardiac silhouette. No effusion, infiltrate pneumothorax. Improvement in central venous congestion pattern seen on comparison exam. IMPRESSION: Stable cardiomegaly. Improvement in central venous pulmonary congestion Electronically Signed   By: Genevive Bi M.D.   On: 11/07/2018 05:12   Dg Chest Port 1 View  Result Date: 11/06/2018 CLINICAL DATA:  Shortness of breath, cough EXAM: PORTABLE CHEST 1 VIEW COMPARISON:  11/05/2018 FINDINGS: Cardiomegaly. Bilateral perihilar and lower lobe opacities, with interstitial prominence. Findings likely reflect edema/CHF. No visible significant effusions or acute bony abnormality. IMPRESSION: Mild to moderate CHF. Electronically Signed   By: Charlett Nose M.D.   On: 11/06/2018 01:01    Procedures Procedures (including critical care time)  Medications Ordered in ED Medications  potassium chloride 10 mEq in 100 mL IVPB (10 mEq Intravenous New Bag/Given 11/07/18 0749)  potassium chloride  (K-DUR) CR tablet 30 mEq (0 mEq Oral Hold 11/07/18 0749)  acetaminophen (TYLENOL) tablet 650 mg (650 mg Oral Given 11/07/18 0435)  furosemide (LASIX) injection 40 mg (40 mg Intravenous Given 11/07/18 6578)     Initial Impression / Assessment and Plan / ED Course  I have reviewed the triage vital signs and the nursing notes.  Pertinent labs & imaging results that were available during my care of the patient were reviewed by me and considered in my medical decision making (see chart for details).        58 year old male with a history of schizophrenia, homelessness, hypertension, polysubstance abuse, chronic systolic and diastolic CHF, and penile cellulitis who is well-known to this emergency department with a 61 visits in the last 6 months with 13 admissions.  The patient was seen and evaluated independently by Dr. Eudelia Bunch, attending physician.  When I initially went to evaluate the patient, he appeared to be hypoxic at 88 to 89% with good waveform on the monitor for a couple of seconds, which then improved into the mid 90s.  Patient has had no other concerns for hypoxia since arriving in the ER.  On exam, patient has 3+ pitting edema to the bilateral lower extremities.  BNP is 1280, up from 983 yesterday.  However, chest x-ray demonstrating improvement in central venous pulmonary congestion.  Troponin is mildly elevated at 0.04, has been elevated in the past due to CHF.  Will order repeat troponin and the patient's history of cocaine use.  EKG unchanged from previous. BMP is pending.  Will give 1 dose of IV Lasix.  If troponin is not up trending and patient is not hypoxic, he should be safe for  discharge pending reassuring BMP.  If the patient becomes hypoxic or if troponin is uptrending or if there are significant metabolic derangements, the patient may require admission.  [facility] end of my shift. Patient presentation, ED course, and plan of care discussed with  review of all pertinent labs and imaging. Please see his/her note for further details regarding further ED course and disposition.  Final Clinical Impressions(s) / ED Diagnoses   Final diagnoses:  None    ED Discharge Orders    None       Barkley BoardsMcDonald, Tausha Milhoan A, PA-C 11/07/18 0802    Nira Connardama, Pedro Eduardo, MD 11/08/18 [phone]

## 2018-11-07 NOTE — Progress Notes (Signed)
ED TO INPATIENT HANDOFF REPORT  Name/Age/Gender [name] 58 y.o. male  Code Status    Code Status Orders  (From admission, onward)         Start     Ordered   11/07/18 1204  Do not attempt resuscitation (DNR)  Continuous    Question Answer Comment  In the event of cardiac or respiratory ARREST Do not call a "code blue"   In the event of cardiac or respiratory ARREST Do not perform Intubation, CPR, defibrillation or ACLS   In the event of cardiac or respiratory ARREST Use medication by any route, position, wound care, and other measures to relive pain and suffering. May use oxygen, suction and manual treatment of airway obstruction as needed for comfort.      11/07/18 1203        Code Status History    Date Active Date Inactive Code Status Order ID Comments User Context   11/07/2018 1112 11/07/2018 1203 Full Code [id number]  Drema Dallas, MD ED   10/18/2018 0358 10/19/2018 1816 Full Code [id number]  Jacques Navy, MD ED   10/12/2018 0331 10/13/2018 1250 Full Code [id number]  Briscoe Deutscher, MD ED   10/09/2018 1730 10/10/2018 1225 Full Code [id number]  Barnetta Chapel, MD Inpatient   10/03/2018 2036 10/05/2018 2022 Full Code [id number]  Charlsie Quest, MD ED   09/21/2018 2001 09/22/2018 1436 Full Code [id number]  Arlyn Dunning, PA-C ED   09/17/2018 1055 09/18/2018 1443 Full Code [id number]  Rama, Maryruth Bun, MD Inpatient   09/02/2018 0100 09/04/2018 1311 Full Code [id number]  Bobette Mo, MD ED   08/25/2018 0326 08/26/2018 1538 Full Code [id number]  Briscoe Deutscher, MD ED   08/23/2018 0645 08/23/2018 1212 Full Code [id number]  Palumbo, April, MD ED   08/18/2018 1114 08/20/2018 1411 Full Code [id number]  Bobette Mo, MD ED   08/08/2018 1627 08/09/2018 1942 Full Code [id number]  Dorcas Carrow, MD Inpatient   07/30/2018 0433 08/01/2018 1404 Full Code [id number]  Haydee Monica, MD ED   07/18/2018 0741 07/21/2018 1727 Full Code [id number]  Alessandra Bevels, MD ED   07/15/2018 1737  07/17/2018 1704 Full Code [id number]  Joycelyn Das, MD Inpatient   03/28/2018 0614 03/30/2018 1603 Full Code [id number]  Burna Cash, MD Inpatient   03/16/2018 0828 03/16/2018 1623 Full Code [id number]  Molt, Bethany, DO ED   01/26/2018 0429 01/26/2018 1453 Full Code [id number]  Paula Libra, MD ED   12/30/2017 1738 12/31/2017 1202 Full Code [id number]  Vanetta Mulders, MD ED   12/26/2017 0510 12/26/2017 1720 Full Code [id number]  Garlon Hatchet, PA-C ED   12/18/2017 0243 12/18/2017 1328 Full Code [id number]  Gwyneth Sprout, MD ED   12/12/2017 0401 12/12/2017 1708 Full Code [id number]  Rise Mu, PA-C ED   11/23/2017 0609 11/23/2017 1316 Full Code [id number]  Ward, Chase Picket, PA-C ED   10/06/2017 0637 10/07/2017 1723 Full Code [id number]  Garlon Hatchet, PA-C ED   09/22/2017 0819 09/23/2017 1352 Full Code [id number]  Pricilla Loveless, MD ED   09/18/2017 0528 09/18/2017 1644 Full Code [id number]  Roxy Horseman, PA-C ED   09/13/2017 2153 09/14/2017 1658 Full Code [id number]  Raeford Razor, MD ED   03/24/2017 0431 03/24/2017 1645 Full Code [id number]  Anselm Pancoast, PA-C ED   01/27/2017 0635 01/28/2017 1637 Full Code [id number]  Glynn Octave, MD ED   01/18/2017 0358 01/18/2017 1705 Full Code [id number]  Darylene Price ED  10/15/2016 0111 10/16/2016 1548 Full Code [id number]  Garlon HatchetSanders, Lisa M, PA-C ED   10/10/2016 0534 10/10/2016 1442 Full Code [id number]  Renne CriglerGeiple, Joshua, PA-C ED   09/29/2016 2146 10/04/2016 1448 Full Code [id number]  Jackelyn PolingBerry, Jason A, NP Inpatient   07/25/2015 0656 07/25/2015 1853 Full Code [id number]  Renne CriglerGeiple, Joshua, PA-C ED   07/21/2015 2148 07/22/2015 1649 Full Code [id number]  Bethann BerkshireZammit, Joseph, MD ED   07/15/2015 2259 07/16/2015 0401 Full Code [id number]  Lyndal PulleyKnott, Daniel, MD ED   05/27/2015 2306 05/28/2015 0544 Full Code [id number]  Gwyneth SproutPlunkett, Whitney, MD ED   05/09/2015 0318 05/09/2015 1118 Full Code [id number]  Gilda CreasePollina, Christopher J, MD ED   04/14/2015 0339 04/15/2015 1502 Full Code [id number]  Earley FavorSchulz, Gail,  NP ED   04/14/2015 0213 04/14/2015 0339 Full Code [id number]  Earley FavorSchulz, Gail, NP ED   03/14/2015 0749 03/14/2015 1425 Full Code [id number]  Arthor CaptainHarris, Abigail, PA-C ED   03/12/2015 1629 03/13/2015 1312 Full Code [id number]  Dub MikesBerman, Rebecca R, RN ED   01/26/2015 0336 01/27/2015 1606 Full Code [id number]  Hillary BowGardner, Jared M, DO ED   01/17/2015 2158 01/23/2015 1727 Full Code [id number]  Shari ProwsPucilowska, Jolanta B, MD Inpatient   01/16/2015 1142 01/17/2015 2158 Full Code [id number]  Camprubi-Soms, France RavensMercedes, PA-C ED   12/22/2014 0451 12/22/2014 1407 Full Code [id number]  Emilia BeckSzekalski, Kaitlyn, PA-C ED   11/21/2014 0339 11/22/2014 1455 Full Code [id number]  Ward, Layla MawKristen N, DO ED   11/13/2014 0258 11/14/2014 1316 Full Code [id number]  Shon BatonHorton, Courtney F, MD ED   11/07/2014 1559 11/08/2014 1432 Full Code [id number]  Samuel JesterMcManus, Kathleen, DO ED   11/04/2014 0056 11/05/2014 1459 Full Code [id number]  Pricilla LovelessGoldston, Scott, MD ED   10/13/2014 0406 10/13/2014 0942 Full Code [id number]  Earley FavorSchulz, Gail, NP ED   10/08/2014 0933 10/08/2014 1345 Full Code [id number]  Blake DivineWofford, John, MD ED   10/01/2014 0744 10/01/2014 1719 Full Code [id number]  Gwyneth SproutPlunkett, Whitney, MD ED   09/07/2014 0322 09/08/2014 1531 Full Code [id number]  Earley FavorSchulz, Gail, NP ED   08/10/2014 1416 08/12/2014 1608 Full Code [id number]  Assunta Foundankin, Shuvon, NP Inpatient   08/10/2014 0401 08/10/2014 1416 Full Code [id number]  Jeannetta Ellisiepenbrink, Jennifer L, PA-C ED   07/29/2014 0226 07/30/2014 1400 Full Code [id number]  Lynden OxfordPatel, Pranav, MD ED   07/06/2014 0441 07/06/2014 1659 Full Code [id number]  Arnoldo HookerUpstill, Shari A, PA-C ED   06/22/2014 1031 06/22/2014 2023 Full Code [id number]  Sharlene Mottsartner, Benjamin W, PA-C ED   06/13/2014 0617 06/14/2014 1419 Full Code [id number]  Emilia BeckSzekalski, Kaitlyn, PA-C ED   06/04/2014 0504 06/04/2014 2312 Full Code [id number]  Ward GivensKnapp, Iva L, MD ED   05/20/2014 1558 05/21/2014 1119 Full Code [id number]  Nanine MeansLord, Jamison, NP Inpatient   05/20/2014 0237 05/20/2014 1558 Full Code [id number]  Arman FilterSchulz, Gail K, NP ED   05/18/2014 0246 05/18/2014 1741  Full Code [id number]  Lyanne Coampos, Kevin M, MD ED   05/13/2014 2103 05/16/2014 1627 Full Code [id number]  Nanine MeansLord, Jamison, NP Inpatient   05/13/2014 0342 05/13/2014 2103 Full Code [id number]  Jeannetta Ellisiepenbrink, Jennifer L, PA-C ED   05/12/2014 0210 05/12/2014 0909 Full Code [id number]  Gilda CreasePollina, Christopher J., MD ED   04/03/2014 1508 04/04/2014 0340 Full Code [id number]  Suzi RootsSteinl, Kevin E, MD ED   03/25/2014 0945 03/25/2014 1846 Full Code [id number]  Deitra MayoAndrews, Annette Carol, RN ED   03/16/2014 0611 03/16/2014 1522 Full Code [id number]  Junius FinnerO'Malley, Erin, PA-C ED   03/10/2014 1108 03/10/2014 2200 Full Code [id number]  Terri PiedraForcucci, Courtney, PA-C ED   03/07/2014 0331 03/07/2014 1619 Full Code  [id number]  Ethelda Chick, MD ED   01/05/2014 0525 01/05/2014 1633 Full Code [id number]  Angus Seller, PA-C ED   01/01/2014 0615 01/01/2014 1520 Full Code [id number]  Trevor Mace, PA-C ED   12/12/2013 0704 12/14/2013 1529 Full Code [id number]  Renne Crigler, PA-C ED   12/10/2013 0033 12/10/2013 1631 Full Code [id number]  Arman Filter, NP ED   10/08/2013 1351 10/13/2013 1531 Full Code [id number]  Fransisca Kaufmann, NP Inpatient   10/08/2013 0352 10/08/2013 1351 Full Code [id number]  Olivia Mackie, MD ED   08/12/2013 0505 08/12/2013 1438 Full Code [id number]  Dione Booze, MD ED   08/04/2013 1624 08/10/2013 1445 Full Code [id number]  Benjaman Pott, MD Inpatient   08/04/2013 0222 08/04/2013 1624 Full Code [id number]  Arman Filter, NP ED   05/26/2013 0156 05/26/2013 1202 Full Code [id number]  Arman Filter, NP ED   04/18/2013 0217 04/23/2013 1615 Full Code [id number]  Janey Greaser Inpatient   04/17/2013 2341 04/18/2013 0217 Full Code [id number]  [name], PA-C ED   04/05/2013 0306 04/05/2013 1934 Full Code [id number]  Molpus, Carlisle Beers, MD ED   03/26/2013 0205 03/26/2013 1727 Full Code [id number]  Arman Filter, NP ED   03/14/2013 0706 03/15/2013 2150 Full Code [id number]  Ward, Layla Maw, DO ED      Home/SNF/Other Home  Chief Complaint leg swelling  Level of  Care/Admitting Diagnosis ED Disposition    ED Disposition Condition Comment   Admit  Hospital Area: [facility] [[id number]]  Level of Care: Stepdown [14]  Admit to SDU based on following criteria: Cardiac Instability:  Patients experiencing chest pain, unconfirmed MI and stable, arrhythmias and CHF requiring medical management and potentially compromising patient's stability  Covid Evaluation: N/A  Diagnosis: Systolic and diastolic CHF, acute on chronic [facility]) [[id number]]  Admitting Physician: Drema Dallas [[id number]]  Attending Physician: Drema Dallas [[id number]]  Estimated length of stay: 5 - 7 days  Certification:: I certify this patient will need inpatient services for at least 2 midnights  PT Class (Do Not Modify): Inpatient [101]  PT Acc Code (Do Not Modify): Private [1]       Medical History Past Medical History:  Diagnosis Date  . Chronic foot pain   . Cocaine abuse (HCC)   . Diabetes mellitus without complication (HCC)   . Hepatitis C    unsure   . Homelessness   . Hypertension   . Neuropathy   . Polysubstance abuse (HCC)   . Schizophrenia (HCC)   . Sleep apnea   . Systolic and diastolic CHF, chronic (HCC)     Allergies Allergies  Allergen Reactions  . Haldol [Haloperidol] Other (See Comments)    Muscle spasms, loss of voluntary movement. However, pt has taken Thorazine on multiple occasions with no adverse effects.     IV Location/Drains/Wounds Patient Lines/Drains/Airways Status   Active Line/Drains/Airways    Name:   Placement date:   Placement time:   Site:   Days:   Peripheral IV 11/06/18 Left Forearm   11/06/18    0059    Forearm   1          Labs/Imaging Results for orders placed or performed during the hospital encounter of 11/07/18 (from the past 48 hour(s))  Brain natriuretic peptide     Status: Abnormal   Collection Time: 11/07/18  4:35 AM  Result Value Ref Range   B Natriuretic Peptide 1,280.4 (H) 0.0 - 100.0 pg/mL  Comment: Performed at [facility], [address], [place], [place] [address]  CBC     Status: Abnormal   Collection Time: 11/07/18  4:35 AM  Result Value Ref Range   WBC 9.5 4.0 - 10.5 K/uL   RBC 4.35 4.22 - 5.81 MIL/uL   Hemoglobin 10.6 (L) 13.0 - 17.0 g/dL   HCT 19.1 (L) 47.8 - 29.5 %   MCV 83.4 80.0 - 100.0 fL   MCH 24.4 (L) 26.0 - 34.0 pg   MCHC 29.2 (L) 30.0 - 36.0 g/dL   RDW 62.1 (H) 30.8 - 65.7 %   Platelets 283 150 - 400 K/uL   nRBC 0.0 0.0 - 0.2 %    Comment: Performed at [facility], [address] [place], [place] [address]  Troponin I - ONCE - STAT     Status: Abnormal   Collection Time: 11/07/18  4:35 AM  Result Value Ref Range   Troponin I 0.04 (HH) <0.03 ng/mL    Comment: CRITICAL RESULT CALLED TO, READ BACK BY AND VERIFIED WITH: Raymond Gurney RN [id number] 11/07/2018 HILL K Performed at [facility], [address], [place], [place] [address]   Basic metabolic panel     Status: Abnormal   Collection Time: 11/07/18  4:35 AM  Result Value Ref Range   Sodium 140 135 - 145 mmol/L   Potassium 3.0 (L) 3.5 - 5.1 mmol/L   Chloride 96 (L) 98 - 111 mmol/L   CO2 29 22 - 32 mmol/L   Glucose, Bld 310 (H) 70 - 99 mg/dL   BUN 23 (H) 6 - 20 mg/dL   Creatinine, Ser 4.40 0.61 - 1.24 mg/dL   Calcium 8.8 (L) 8.9 - 10.3 mg/dL   GFR calc non Af Amer >60 >60 mL/min   GFR calc Af Amer >60 >60 mL/min   Anion gap 15 5 - 15    Comment: Performed at [facility], [address], [place], [place] [address]  Troponin I - ONCE - STAT     Status: Abnormal   Collection Time: 11/07/18  7:14 AM  Result Value Ref Range   Troponin I 0.04 (HH) <0.03 ng/mL    Comment: CRITICAL VALUE NOTED.  VALUE IS CONSISTENT WITH PREVIOUSLY REPORTED AND CALLED VALUE. Performed at [facility], [address] [place], [place] [address]   Magnesium     Status: None   Collection Time: 11/07/18  7:14 AM  Result Value Ref  Range   Magnesium 1.9 1.7 - 2.4 mg/dL    Comment: Performed at [facility], [address], [place], [place] [address]  Ethanol     Status: None   Collection Time: 11/07/18 12:03 PM  Result Value Ref Range   Alcohol, Ethyl (B) <10 <10 mg/dL    Comment: (NOTE) Lowest detectable limit for serum alcohol is 10 mg/dL. For medical purposes only. Performed at [facility], [address], [place], [place] [address]   CBC     Status: Abnormal   Collection Time: 11/07/18 12:03 PM  Result Value Ref Range   WBC 8.2 4.0 - 10.5 K/uL   RBC 3.95 (L) 4.22 - 5.81 MIL/uL   Hemoglobin 9.7 (L) 13.0 - 17.0 g/dL   HCT 63.8 (L) 75.6 - 43.3 %   MCV 83.5 80.0 - 100.0 fL   MCH 24.6 (L) 26.0 - 34.0 pg   MCHC 29.4 (L) 30.0 - 36.0 g/dL   RDW  19.9 (H) 11.5 - 15.5 %   Platelets 250 150 - 400 K/uL   nRBC 0.0 0.0 - 0.2 %    Comment: Performed at [facility], [address], [place], [place] [address]  Creatinine, serum     Status: None   Collection Time: 11/07/18 12:03 PM  Result Value Ref Range   Creatinine, Ser 0.92 0.61 - 1.24 mg/dL   GFR calc non Af Amer >60 >60 mL/min   GFR calc Af Amer >60 >60 mL/min    Comment: Performed at [facility], [address], [place], [place] [address]   Dg Chest 2 View  Result Date: 11/05/2018 CLINICAL DATA:  Shortness of breath EXAM: CHEST - 2 VIEW COMPARISON:  11/04/2018, 10/26/2018, 10/17/2018 FINDINGS: Cardiomegaly with vascular congestion and mild diffuse interstitial process, likely edema. Small bilateral pleural effusions. No pneumothorax. Linear atelectasis or scar in the left mid lung. IMPRESSION: Cardiomegaly with vascular congestion, mild interstitial edema and small bilateral pleural effusions. Electronically Signed   By: [name]   On: 11/05/2018 17:57   Dg Chest Portable 1 View  Result Date: 11/07/2018 CLINICAL DATA:  Pt came to the ED with complaints of bilateral legs  swelling, testicle pain and weakness PT HX: current smoker, DM, HTNleft swelling, shortness of breath; h/o of CHF EXAM: PORTABLE CHEST 1 VIEW COMPARISON:  11/06/2018 FINDINGS: Stable enlarged cardiac silhouette. No effusion, infiltrate pneumothorax. Improvement in central venous congestion pattern seen on comparison exam. IMPRESSION: Stable cardiomegaly. Improvement in central venous pulmonary congestion Electronically Signed   By: Genevive Bi M.D.   On: 11/07/2018 05:12   Dg Chest Port 1 View  Result Date: 11/06/2018 CLINICAL DATA:  Shortness of breath, cough EXAM: PORTABLE CHEST 1 VIEW COMPARISON:  11/05/2018 FINDINGS: Cardiomegaly. Bilateral perihilar and lower lobe opacities, with interstitial prominence. Findings likely reflect edema/CHF. No visible significant effusions or acute bony abnormality. IMPRESSION: Mild to moderate CHF. Electronically Signed   By: Charlett Nose M.D.   On: 11/06/2018 01:01    Pending Labs Unresulted Labs (From admission, onward)    Start     Ordered   11/14/18 0500  Creatinine, serum  (enoxaparin (LOVENOX)    CrCl >/= 30 ml/min)  Weekly,   R    Comments:  while on enoxaparin therapy    11/07/18 1112   11/08/18 0500  Basic metabolic panel  Daily,   R     11/07/18 1112   11/08/18 0500  Basic metabolic panel  Daily,   R     11/07/18 1112   11/08/18 0500  Magnesium  Daily,   R     11/07/18 1112   11/07/18 1235  HIV Antibody (routine testing w rflx)  Once,   R     11/07/18 1234   11/07/18 1228  Herpes simplex virus (HSV), DNA by PCR Blood  ONCE - STAT,   R     11/07/18 1234   11/07/18 1227  RPR  (GC, Chlamydia, RPR Panel (PNL))  Once,   R     11/07/18 1234   11/07/18 1057  Urine rapid drug screen (hosp performed)  ONCE - STAT,   R     11/07/18 1056          Vitals/Pain Today's Vitals   11/07/18 1030 11/07/18 1130 11/07/18 1200 11/07/18 1230  BP: (!) 171/107 (!) 150/103  (!) 141/99  Pulse: 75  (!) 113   Resp: 19     Temp:  TempSrc:      SpO2:  94%  97%   Weight:      Height:      PainSc:        Isolation Precautions No active isolations  Medications Medications  sodium chloride flush (NS) 0.9 % injection 3 mL (3 mLs Intravenous Given 11/07/18 1200)  sodium chloride flush (NS) 0.9 % injection 3 mL (has no administration in time range)  0.9 %  sodium chloride infusion (has no administration in time range)  acetaminophen (TYLENOL) tablet 650 mg (has no administration in time range)  ondansetron (ZOFRAN) injection 4 mg (has no administration in time range)  enoxaparin (LOVENOX) injection 40 mg (has no administration in time range)  aspirin EC tablet 81 mg (81 mg Oral Given 11/07/18 1159)  furosemide (LASIX) injection 80 mg (has no administration in time range)  carvedilol (COREG) tablet 6.25 mg (has no administration in time range)  tamsulosin (FLOMAX) capsule 0.4 mg (has no administration in time range)  atorvastatin (LIPITOR) tablet 80 mg (has no administration in time range)  ARIPiprazole (ABILIFY) tablet 10 mg (has no administration in time range)  carbamazepine (TEGRETOL) tablet 100 mg (has no administration in time range)  nicotine (NICODERM CQ - dosed in mg/24 hours) patch 21 mg (has no administration in time range)  insulin aspart (novoLOG) injection 0-15 Units (has no administration in time range)  acetaminophen (TYLENOL) tablet 650 mg (650 mg Oral Given 11/07/18 0435)  furosemide (LASIX) injection 40 mg (40 mg Intravenous Given 11/07/18 0625)  potassium chloride 10 mEq in 100 mL IVPB (0 mEq Intravenous Stopped 11/07/18 0847)  potassium chloride (K-DUR) CR tablet 30 mEq (30 mEq Oral Given 11/07/18 0920)    Mobility walks ]

## 2018-11-07 NOTE — ED Notes (Signed)
Date and time results received: 11/07/18 5:51 AM  (use smartphrase ".now" to insert current time)  Test: Troponin Critical Value: 0.04  Name of Provider Notified: [place]

## 2018-11-07 NOTE — ED Provider Notes (Signed)
Care assumed from [facility], [place].  Please see her full H&P.  In short,  [name] is a 58 y.o. male with a PMH of schizophrenia, homelessness, CHF with LVEF on 25-30%, Diabetes, and polysubstance abuse presents for shortness of breath and bilateral leg edema. Patient denies fever, cough, chest pain, nausea, vomiting, or abdominal pain. Patient has not been compliant with his medications. Patient also reports eye irritation. Patient denies eye pain or discharge.  Physical Exam  BP 108/77   Pulse 94   Temp 98 F (36.7 C)   Resp 17   Ht 5\' 11"  (1.803 m)   Wt 97.5 kg   SpO2 98%   BMI 29.99 kg/m   Physical Exam Vitals signs and nursing note reviewed. Exam conducted with a chaperone present.  Constitutional:      General: He is not in acute distress.    Appearance: He is well-developed. He is not diaphoretic.  HENT:     Head: Normocephalic and atraumatic.  Neck:     Musculoskeletal: Normal range of motion.  Cardiovascular:     Rate and Rhythm: Normal rate and regular rhythm.     Heart sounds: Normal heart sounds. No murmur. No friction rub. No gallop.   Pulmonary:     Effort: Pulmonary effort is normal. No respiratory distress.     Breath sounds: Normal breath sounds. No wheezing or rales.  Abdominal:     General: There is distension.     Palpations: Abdomen is soft.     Tenderness: There is no abdominal tenderness. There is no guarding.  Genitourinary:    Comments: Penile edema noted on exam. Musculoskeletal: Normal range of motion.     Right lower leg: Edema present.     Left lower leg: Edema present.  Skin:    General: Skin is warm.     Findings: No erythema or rash.  Neurological:     Mental Status: He is alert.    ED Course/Procedures     Procedures  MDM   Patient presents with shortness of breath and leg edema. BNP elevated at 1,280.4. Patient was given IV lasix. Troponin elevated at 0.04. Troponin has been elevated in the past due to CHF. Second  troponin is 0.04. WBCs are within normal limits. CXR reveals stable cardiomegaly and improvement in central venous pulmonary congestion. EKG without acute changes. Hypokalemia noted at 3.0. Supplemented potassium. Hyperglycemia noted at 310. Suspect this is likely due to non compliance with medications. Suspect eye irritation is due to allergies. Provided naphcon-A in the ER with relief.  Patient has had 60 ED visits in the last 6 months. Patient ambulated with assistance and oxygen saturation dropped to 82%. Patient is not requiring oxygen at rest and oxygen saturation is 94% while at rest. Patient will require admission for acute CHF exacerbation, elevated troponin, and hypoxia with ambulation. Consulted hospitalist for admission. Hospitalist has agreed to admit patient.   Findings and plan of care discussed with supervising physician Dr. Lynelle Doctor.    Carlyle Basques [place], [place] 11/07/18 1043    Linwood Dibbles, MD 11/08/18 1001

## 2018-11-07 NOTE — Progress Notes (Signed)
  Echocardiogram 2D Echocardiogram has been performed.  Gerda Diss 11/07/2018, 1:28 PM

## 2018-11-07 NOTE — ED Notes (Signed)
Patient able to ambulate to restroom without assistance. Patient was incontinent of urine and stool and patient was able to provide his own perineal care in restroom.

## 2018-11-08 ENCOUNTER — Encounter (HOSPITAL_COMMUNITY): Payer: Self-pay | Admitting: *Deleted

## 2018-11-08 DIAGNOSIS — Z794 Long term (current) use of insulin: Secondary | ICD-10-CM

## 2018-11-08 DIAGNOSIS — E119 Type 2 diabetes mellitus without complications: Secondary | ICD-10-CM

## 2018-11-08 DIAGNOSIS — L8990 Pressure ulcer of unspecified site, unspecified stage: Secondary | ICD-10-CM

## 2018-11-08 LAB — GLUCOSE, CAPILLARY
Glucose-Capillary: 198 mg/dL — ABNORMAL HIGH (ref 70–99)
Glucose-Capillary: 207 mg/dL — ABNORMAL HIGH (ref 70–99)
Glucose-Capillary: 323 mg/dL — ABNORMAL HIGH (ref 70–99)
Glucose-Capillary: 222 mg/dL — ABNORMAL HIGH (ref 70–99)
Glucose-Capillary: 224 mg/dL — ABNORMAL HIGH (ref 70–99)

## 2018-11-08 LAB — BASIC METABOLIC PANEL
Anion gap: 9 (ref 5–15)
BUN: 23 mg/dL — ABNORMAL HIGH (ref 6–20)
CO2: 31 mmol/L (ref 22–32)
Calcium: 8.4 mg/dL — ABNORMAL LOW (ref 8.9–10.3)
Chloride: 100 mmol/L (ref 98–111)
Creatinine, Ser: 0.97 mg/dL (ref 0.61–1.24)
GFR calc Af Amer: >60 mL/min (ref >60)
GFR calc non Af Amer: >60 mL/min (ref >60)
Glucose, Bld: 215 mg/dL — ABNORMAL HIGH (ref 70–99)
Potassium: 3.4 mmol/L — ABNORMAL LOW (ref 3.5–5.1)
Sodium: 140 mmol/L (ref 135–145)

## 2018-11-08 LAB — HIV ANTIBODY (ROUTINE TESTING W REFLEX): HIV Screen 4th Generation wRfx: NONREACTIVE

## 2018-11-08 LAB — MAGNESIUM: Magnesium: 1.9 mg/dL (ref 1.7–2.4)

## 2018-11-08 LAB — RPR: RPR Ser Ql: NONREACTIVE

## 2018-11-08 MED ORDER — NICOTINE POLACRILEX 2 MG MT GUM
2.0000 mg | CHEWING_GUM | OROMUCOSAL | Status: DC | PRN
  Filled 2018-11-08: qty 1

## 2018-11-08 MED ORDER — LORAZEPAM 1 MG PO TABS
1.0000 mg | ORAL_TABLET | ORAL | Status: DC | PRN
  Administered 2018-11-08: 1 mg via ORAL
  Filled 2018-11-08: qty 1

## 2018-11-08 NOTE — TOC Initial Note (Signed)
Transition of Care [facility]) - Initial/Assessment Note    Patient Details  Name: [name] MRN: [id number] Date of Birth: 02/20/1961  Transition of Care [facility]) CM/SW Contact:    Verna Czech [place], [place] Phone Number: 11/08/2018, 4:13 PM  Clinical Narrative:               Patient is 58 year old male who presented with acute on chronic systolic heart failure.  He does have significant past medical history for paranoid schizophrenia, substance abuse, hypertension, type 2 diabetes mellitus and hepatitis C. Patient  complains of bilateral lower extremity edema, testicular swelling and generalized weakness. Patient stated that he has been homeless since his release from jail on 09/20/2018. Per patient, he does not want to discharge to a shelter and would like a group home now that he has an income. Per patient, he has a payee that manages his money. This Child psychotherapist provided patient with a list of housing resources that can be contacted to explore housing options including Day Centers, Homeless Shelters and [employer]  Expected Discharge Plan: Homeless Shelter Barriers to Discharge: Homeless with medical needs, Active Substance Use - Placement   Patient Goals and CMS Choice Patient states their goals for this hospitalization and ongoing recovery are:: ("I want to go somewhere where I can sleep and take my medications")      Expected Discharge Plan and Services Expected Discharge Plan: Homeless Shelter In-house Referral: Clinical Social Work   Post Acute Care Choice: NA Living arrangements for the past 2 months: Homeless Shelter                                      Prior Living Arrangements/Services Living arrangements for the past 2 months: Homeless Shelter Lives with:: Self Patient language and need for interpreter reviewed:: Yes Do you feel safe going back to the place where you live?: No   (patient states that he has been homeless since his release from  jail on 09/20/18. He would like to go to a group home)  Need for Family Participation in Patient Care: Yes (Comment) Care giver support system in place?: Yes (comment)(patient has a payee-Candace Brahen)   Criminal Activity/Legal Involvement Pertinent to Current Situation/Hospitalization: No - Comment as needed  Activities of Daily Living Home Assistive Devices/Equipment: Cane (specify quad or straight) ADL Screening (condition at time of admission) Patient's cognitive ability adequate to safely complete daily activities?: Yes Is the patient deaf or have difficulty hearing?: No Does the patient have difficulty seeing, even when wearing glasses/contacts?: No Does the patient have difficulty concentrating, remembering, or making decisions?: No Patient able to express need for assistance with ADLs?: Yes Does the patient have difficulty dressing or bathing?: No Independently performs ADLs?: Yes (appropriate for developmental age) Does the patient have difficulty walking or climbing stairs?: Yes Weakness of Legs: Both Weakness of Arms/Hands: None  Permission Sought/Granted   Permission granted to share information with : Yes, Verbal Permission Granted  Share Information with NAME:  Candis Brahen     Permission granted to share info w Relationship: payee  Permission granted to share info w Contact Information: [phone]  Emotional Assessment Appearance:: Appears older than stated age, Disheveled Attitude/Demeanor/Rapport: Aggressive (Verbally and/or physically), Lethargic Affect (typically observed): Defensive, Irritable Orientation: : Oriented to Self, Oriented to Place, Oriented to  Time, Oriented to Situation Alcohol / Substance Use: Illicit Drugs Psych Involvement: Yes (comment)  Admission  diagnosis:  Elevated troponin [R79.89] Acute on chronic diastolic congestive heart failure (HCC) [I50.33] Patient Active Problem List   Diagnosis Date Noted  . Pressure injury of skin  11/08/2018  . Systolic and diastolic CHF, acute on chronic (HCC) 11/07/2018  . Elevated troponin 10/18/2018  . HLD (hyperlipidemia) 10/18/2018  . Anxiety 10/18/2018  . CHF (congestive heart failure), NYHA class II, acute on chronic, combined (HCC) 10/18/2018  . Hypertensive urgency 10/12/2018  . Mild renal insufficiency 10/12/2018  . Cellulitis 10/12/2018  . Penile cellulitis   . Adjustment disorder with mixed disturbance of emotions and conduct   . Sleep apnea 10/03/2018  . Hypokalemia 09/17/2018  . Scrotal swelling 09/17/2018  . Urinary hesitancy   . Constipation   . Acute on chronic combined systolic and diastolic CHF (congestive heart failure) (HCC) 08/25/2018  . Acute respiratory failure with hypoxia (HCC) 08/25/2018  . Acute on chronic combined systolic and diastolic congestive heart failure (HCC) 08/18/2018  . Tobacco use 08/18/2018  . 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 Homelessness 08/08/2018  . Smoker 08/08/2018  . Acute exacerbation of CHF (congestive heart failure) (HCC) 03/28/2018  . Prostate enlargement 03/16/2018  . Aortic atherosclerosis (HCC) 03/16/2018  . 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 Chronic foot pain   . Schizoaffective disorder, bipolar type (HCC) 09/30/2016  . Substance induced mood disorder (HCC) 03/13/2015  . Acute kidney failure (HCC) 01/26/2015  . Schizophrenia, paranoid type (HCC) 01/17/2015  . Drug hallucinosis (HCC) 10/08/2014  . Chronic paranoid schizophrenia (HCC) 09/07/2014  . 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 Urinary retention   . Cocaine use disorder, severe, dependence (HCC)   . Essential hypertension, benign 03/28/2013  . Insulin-requiring or dependent type II diabetes mellitus (HCC) 03/15/2013   PCP:  Patient, No Pcp Per Pharmacy:   [id number] [place]  [place] [address] [place] [address] Phone: [phone] Fax: [phone]  [facility] [place] [address] [place] [address] Phone: [phone] Fax: [phone]  [id number] [place] [facility] [address] [facility] [address] [place] [address] Phone: [phone] Fax: [phone]     Social Determinants of Health (SDOH) Interventions    Readmission Risk Interventions Readmission Risk Prevention Plan 10/05/2018  Transportation Screening Complete  Medication Review (RN Care Manager) Complete  PCP or Specialist appointment within 3-5 days of discharge Complete  HRI or Home Care Consult Patient refused  SW Recovery Care/Counseling Consult Complete  Palliative Care Screening Not Applicable  Skilled Nursing Facility Not Applicable  Some recent data might be hidden

## 2018-11-08 NOTE — Progress Notes (Addendum)
Patient transferred to room 1402 at 1130. Agree with previous RN assessment. He denies pain, A&O x 3. VS stable. Cardiac monitoring applied. Patient refused continuous pulse ox. Patient oriented to room, fall precautions, and call bell.   [name]. Clelia Croft, RN

## 2018-11-08 NOTE — Progress Notes (Signed)
Report called to receiving RN 1402. Patient with no complaints at the current time. Patient agrees not to leave AMA after talking to Dr Ella Jubilee after diet changes were made. Will transfer via WC.

## 2018-11-08 NOTE — Progress Notes (Signed)
PROGRESS NOTE    [name]  ZOX:[id number] DOB: 22-Jun-1961 DOA: 11/07/2018 PCP: [name], No Pcp Per    Brief Narrative:  58 year old male who presented with acute on chronic systolic heart failure.  He does have significant past medical history for paranoid schizophrenia, substance abuse, hypertension, type 2 diabetes mellitus and hepatitis C. he complaint of bilateral lower extremity edema, testicular swelling and generalized weakness.  [name] had about 13 admissions for the last 6 months.  On his initial physical examination his blood pressure was 157/89, heart rate 88, respiratory rate 19, temperature 98, oxygen saturation 91%, his lungs are clear to auscultation bilaterally, heart S1-S2 present and rhythmic, no gallops, rubs or murmurs, his abdomen was protuberant, distended, positive for plus pitting bilateral extremity edema, positive scrotal edema.  Sodium 140, potassium 3.0, chloride 96, bicarb 29, glucose 310, BUN 23, creatinine 1.0, BNP 1280, white count 9.5, hemoglobin 10.6, hematocrit 36.3, platelets 283. SARS COVID-19 negative.  Tox screen positive for cocaine.  Alcohol less than 10.  His chest x-ray had cardiomegaly with increased hilar vascular congestion.  EKG 105 bpm, normal axis, normal intervals, sinus rhythm, no ST segment changes, chronicT wave inversions in V5 to V6.  Poor R wave progression.  [name] was admitted to the hospital working diagnosis of acute decompensation of systolic heart failure.  Assessment & Plan:   Active Problems:   Insulin-requiring or dependent type II diabetes mellitus (HCC)   Essential hypertension, benign   Cocaine use disorder, severe, dependence (HCC)   Chronic paranoid schizophrenia (HCC)   Schizophrenia, paranoid type (HCC)   Chronic foot pain   Acute on chronic combined systolic (congestive) and diastolic (congestive) heart failure (HCC)   Homelessness   Tobacco use   Acute respiratory failure with hypoxia (HCC)   Sleep apnea   Adjustment disorder with mixed disturbance of emotions and conduct   Penile cellulitis   HLD (hyperlipidemia)   Anxiety   Systolic and diastolic CHF, acute on chronic (HCC)   Pressure injury of skin   1. Acute on chronic decompensated systolic heart failure. [name] with significant volume overload, urine output over last 24 H, 1,500 ml. His blood pressure systolic 140 mmHg. LV systolic function is 25 to 30% with mildly dilated cavity and moderate concentric hypertrophy. Will continue aggressive diuresis with 80 mg furosemide tid, will continue to follow on urine output. Continue telemetry monitoring and strict in and out. Continue heart failure management with carvedilol, holding on ace inh for now to prevent hypotension while aggressive diuresis.   2. HTN. Positive volume overload, will continue diuresis and carvedilol.   3. T2Dm. Will continue glucose cover and monitoring with insulin sliding scale, [name] is tolerating po and requesting an unrestricted diet.   4. Scrotal cellulitis, No signs of bacterial infection, will dc vancomycin and will continue close monitoring.   5. Cocaine intoxication and tobacco abuse. Continue neuro checks per unit protocol, seems to be tolerating well low dose carvedilol, will continue nicotine patch.   6. Schizophrenia/ paranoid. [name] has attempted to leave the hospital against medical advice x2 time today. Convinced him to stay and continue treatment for heart failure. He is a high risk for rapid deterioration with treatment is interrupted. Continue aripizole and carbamazepine.   DVT prophylaxis: enoxaparin   Code Status: full Family Communication: no family at the bedside  Disposition Plan/ discharge barriers: pending clinical improvement.   Body mass index is 29.99 kg/m. Malnutrition Type:      Malnutrition Characteristics:  Nutrition Interventions:     RN Pressure Injury Documentation: Pressure Injury 11/07/18 Stage II -   Partial thickness loss of dermis presenting as a shallow open ulcer with a red, pink wound bed without slough. small dime sized area (Active)  11/07/18 1532  Location: Sacrum  Location Orientation: Mid  Staging: Stage II -  Partial thickness loss of dermis presenting as a shallow open ulcer with a red, pink wound bed without slough.  Wound Description (Comments): small dime sized area  Present on Admission: Yes     Pressure Injury 11/07/18 Stage II -  Partial thickness loss of dermis presenting as a shallow open ulcer with a red, pink wound bed without slough. small dime sized area (Active)  11/07/18 1535  Location: Hip  Location Orientation: Right  Staging: Stage II -  Partial thickness loss of dermis presenting as a shallow open ulcer with a red, pink wound bed without slough.  Wound Description (Comments): small dime sized area  Present on Admission: Yes     Consultants:     Procedures:     Antimicrobials:       Subjective: [name] continue to have dyspnea, lower extremity and scrotal edema. Asking to have his diet liberated, he wants to be treated but at the same time is attempting to leave AMA.   Objective: Vitals:   11/08/18 0500 11/08/18 0600 11/08/18 0700 11/08/18 0823  BP: (!) 146/92 (!) 151/87 (!) 142/85   Pulse: 95 78 91   Resp: (!) 23 11 (!) 28   Temp:    (!) 97.3 F (36.3 C)  TempSrc:    Oral  SpO2: 95% 100% (!) 77%   Weight:      Height:        Intake/Output Summary (Last 24 hours) at 11/08/2018 0935 Last data filed at 11/08/2018 0230 Gross per 24 hour  Intake 2190 ml  Output 1500 ml  Net 690 ml   Filed Weights   11/07/18 0418  Weight: 97.5 kg    Examination:   General: deconditioned  Neurology: Awake and alert, non focal  E ENT: mild pallor, no icterus, oral mucosa moist Cardiovascular: Positive JVD. S1-S2 present, rhythmic, no gallops, rubs, or murmurs. ++++ pitting lower extremity edema. Pulmonary: positive breath sounds bilaterally,  poor air movement, no wheezing, rhonchi or rales. Gastrointestinal. Abdomen protuberant with no organomegaly, non tender, no rebound or guarding Skin. No rashes Musculoskeletal: no joint deformities     Data Reviewed: I have personally reviewed following labs and imaging studies  CBC: Recent Labs  Lab 11/04/18 0241 11/05/18 1844 11/06/18 0052 11/07/18 0435 11/07/18 1203  WBC 7.1 8.0 7.4 9.5 8.2  NEUTROABS 5.4 5.8 5.8  --   --   HGB 9.8* 9.3* 9.4* 10.6* 9.7*  HCT 32.6* 32.3* 32.7* 36.3* 33.0*  MCV 81.7 83.9 85.4 83.4 83.5  PLT 268 256 227 283 250   Basic Metabolic Panel: Recent Labs  Lab 11/04/18 0241 11/05/18 1844 11/06/18 0052 11/07/18 0435 11/07/18 0714 11/07/18 1203 11/08/18 0223  NA 139 142 137 140  --   --  140  K 3.4* 3.5 3.6 3.0*  --   --  3.4*  CL 103 103 104 96*  --   --  100  CO2 26 31 26 29   --   --  31  GLUCOSE 253* 199* 317* 310*  --   --  215*  BUN 18 18 20  23*  --   --  23*  CREATININE 1.09 1.01 0.98  1.03  --  0.92 0.97  CALCIUM 8.5* 8.8* 8.5* 8.8*  --   --  8.4*  MG  --   --  1.9  --  1.9  --  1.9   GFR: Estimated Creatinine Clearance: 100.1 mL/min (by C-G formula based on SCr of 0.97 mg/dL). Liver Function Tests: No results for input(s): AST, ALT, ALKPHOS, BILITOT, PROT, ALBUMIN in the last 168 hours. No results for input(s): LIPASE, AMYLASE in the last 168 hours. No results for input(s): AMMONIA in the last 168 hours. Coagulation Profile: No results for input(s): INR, PROTIME in the last 168 hours. Cardiac Enzymes: Recent Labs  Lab 11/07/18 0435 11/07/18 0714  TROPONINI 0.04* 0.04*   BNP (last 3 results) No results for input(s): PROBNP in the last 8760 hours. HbA1C: No results for input(s): HGBA1C in the last 72 hours. CBG: Recent Labs  Lab 11/07/18 1547 11/07/18 2002 11/07/18 2354 11/08/18 0445 11/08/18 0807  GLUCAP 259* 139* 184* 198* 207*   Lipid Profile: No results for input(s): CHOL, HDL, LDLCALC, TRIG, CHOLHDL,  LDLDIRECT in the last 72 hours. Thyroid Function Tests: No results for input(s): TSH, T4TOTAL, FREET4, T3FREE, THYROIDAB in the last 72 hours. Anemia Panel: No results for input(s): VITAMINB12, FOLATE, FERRITIN, TIBC, IRON, RETICCTPCT in the last 72 hours.    Radiology Studies: I have reviewed all of the imaging during this hospital visit personally     Scheduled Meds: . ARIPiprazole  10 mg Oral Daily  . aspirin EC  81 mg Oral Daily  . atorvastatin  80 mg Oral q1800  . carbamazepine  100 mg Oral BID  . carvedilol  6.25 mg Oral BID WC  . Chlorhexidine Gluconate Cloth  6 each Topical Q0600  . enoxaparin (LOVENOX) injection  40 mg Subcutaneous Q24H  . furosemide  80 mg Intravenous Q8H  . insulin aspart  0-15 Units Subcutaneous Q4H  . mupirocin ointment  1 application Nasal BID  . nicotine  21 mg Transdermal Daily  . sodium chloride flush  3 mL Intravenous Q12H  . tamsulosin  0.4 mg Oral QPC breakfast   Continuous Infusions: . sodium chloride    . vancomycin Stopped (11/08/18 0230)     LOS: 1 day        Joe Gee Annett Gula, MD

## 2018-11-08 NOTE — Progress Notes (Signed)
Patient stated twice he wanted to leave AMA. MD notified. After talking with MD patient decided to stay and receive inpatient diuresis for HF symptoms. Will continue to educate patient on plan of care and medications/treatment plan for heart failure.    Earnest Conroy. Clelia Croft, RN

## 2018-11-09 ENCOUNTER — Encounter (HOSPITAL_COMMUNITY): Payer: Self-pay | Admitting: *Deleted

## 2018-11-09 LAB — BASIC METABOLIC PANEL
Anion gap: 10 (ref 5–15)
BUN: 32 mg/dL — ABNORMAL HIGH (ref 6–20)
CO2: 29 mmol/L (ref 22–32)
Calcium: 8.8 mg/dL — ABNORMAL LOW (ref 8.9–10.3)
Chloride: 101 mmol/L (ref 98–111)
Creatinine, Ser: 1.38 mg/dL — ABNORMAL HIGH (ref 0.61–1.24)
GFR calc Af Amer: >60 mL/min (ref >60)
GFR calc non Af Amer: 56 mL/min — ABNORMAL LOW (ref >60)
Glucose, Bld: 144 mg/dL — ABNORMAL HIGH (ref 70–99)
Potassium: 4.1 mmol/L (ref 3.5–5.1)
Sodium: 140 mmol/L (ref 135–145)

## 2018-11-09 LAB — GLUCOSE, CAPILLARY
Glucose-Capillary: 132 mg/dL — ABNORMAL HIGH (ref 70–99)
Glucose-Capillary: 172 mg/dL — ABNORMAL HIGH (ref 70–99)
Glucose-Capillary: 197 mg/dL — ABNORMAL HIGH (ref 70–99)
Glucose-Capillary: 271 mg/dL — ABNORMAL HIGH (ref 70–99)
Glucose-Capillary: 365 mg/dL — ABNORMAL HIGH (ref 70–99)

## 2018-11-09 LAB — MAGNESIUM: Magnesium: 2 mg/dL (ref 1.7–2.4)

## 2018-11-09 MED ORDER — PRO-STAT SUGAR FREE PO LIQD
30.0000 mL | Freq: Two times a day (BID) | ORAL | Status: DC
  Administered 2018-11-09 (×2): 30 mL via ORAL
  Filled 2018-11-09: qty 30

## 2018-11-09 MED ORDER — FUROSEMIDE 20 MG PO TABS: 60.0000 mg | ORAL_TABLET | Freq: Every day | ORAL | 0 refills | Status: DC

## 2018-11-09 MED ORDER — FUROSEMIDE 10 MG/ML IJ SOLN: 80.0000 mg | Freq: Two times a day (BID) | INTRAMUSCULAR | Status: DC

## 2018-11-09 MED ORDER — ENSURE ENLIVE PO LIQD: 237.0000 mL | ORAL | Status: DC

## 2018-11-09 MED ORDER — FUROSEMIDE 40 MG PO TABS: 60.0000 mg | ORAL_TABLET | Freq: Every day | ORAL | Status: DC

## 2018-11-09 NOTE — Progress Notes (Signed)
Initial Nutrition Assessment  RD working remotely.   DOCUMENTATION CODES:   Not applicable  INTERVENTION:  - will order Ensure Enlive once/day, each supplement provides 350 kcal and 20 grams of protein. - will order 30 mL Prostat BID, each supplement provides 100 kcal and 15 grams of protein. - continue to encourage PO intakes.    NUTRITION DIAGNOSIS:   Increased nutrient needs related to wound healing as evidenced by estimated needs.  GOAL:   Patient will meet greater than or equal to 90% of their needs  MONITOR:   PO intake, Supplement acceptance, Labs, Weight trends  REASON FOR ASSESSMENT:   Other (Comment)(Pressure Injury report)  ASSESSMENT:   58 year old male with past medical history of significant past medical history for paranoid schizophrenia, substance abuse, HTN, type 2 DM, CHF, and hepatitis C. He presented to the ED with complaints of BLE edema, testicular swelling, and generalized weakness. Patient has had ~13 admissions in the last 6 months. CXR showed cardiomegaly with increased hilar vascular congestion. Patient was admitted to the hospital working diagnosis of acute decompensation of systolic heart failure.  Current BMI indicates obesity. Noted that weight on admission appears to be a stated weight of 215 lb; used that weight to estimate nutrition needs because it is in line with weight trend over the past 1 month. RN flow sheet indicates mild edema to BUE and deep pitting edema to BLE. This is likely the reason for weight currently being elevated from baseline.   Patient has mainly been consuming 100% of meals. Will order supplements to aid in meeting estimated protein needs d/t wounds.   Per notes: acute on CHF with aggressive diuretic regimen ordered, HTN with volume overload, scrotal cellulitis, cocaine use and tobacco abuse. Patient had attempted to leave AMA x2 on 5/10 but medical team was able to convince him to stay.    Medications reviewed; 80 mg  IV lasix TID, sliding scale novolog. Labs reviewed; CBGs: 197, 132, and 172 mg/dl today, BUN: 32 mg/dl, creatinine: 5.57 mg/dl, GFR: 56 ml/min.       NUTRITION - FOCUSED PHYSICAL EXAM:  unable to complete at this time  Diet Order:   Diet Order            Diet regular Room service appropriate? Yes; Fluid consistency: Thin  Diet effective now              EDUCATION NEEDS:   No education needs have been identified at this time  Skin:  Skin Assessment: Skin Integrity Issues: Skin Integrity Issues:: Stage II Stage II: sacrum and R hip  Last BM:  5/10  Height:   Ht Readings from Last 1 Encounters:  11/07/18 5\' 11"  (1.803 m)    Weight:   Wt Readings from Last 1 Encounters:  11/09/18 104.9 kg    Ideal Body Weight:  78.2 kg  BMI:  Body mass index is 32.25 kg/m.  Estimated Nutritional Needs:   Kcal:  3220-2542 kcal  Protein:  110-120 grams  Fluid:  >/= 1.5 L/day     Trenton Gammon, MS, RD, LDN, [facility] Inpatient Clinical Dietitian Pager # [phone] After hours/weekend pager # [phone]

## 2018-11-09 NOTE — Discharge Summary (Addendum)
Physician Discharge Summary  [name] ZOX:[id number] DOB: 01/20/1961 DOA: 11/07/2018  PCP: Patient, No Pcp Per  Admit date: 11/07/2018 Discharge date: 11/09/2018  Admitted From: Home  Disposition:  Home   Recommendations for Outpatient Follow-up and new medication changes:  1. Patient has signed himself against medical advice.    Brief/Interim Summary: 58 year old male who presented with acute on chronic systolic heart failure exacerbation. He does have significant past medical history for paranoid schizophrenia, substance abuse, hypertension, type 2 diabetes mellitus andhepatitis C. He complaint of bilateral lower extremity edema, testicular swelling and generalized weakness. Patient had about 13 admissions for the last 6 months. On his initial physical examination his blood pressure was 157/89, heart rate 88, respiratory rate 19, temperature 98, oxygen saturation 91%,his lungs were clear to auscultation bilaterally, heart S1-S2 present and rhythmic, no gallops, rubs or murmurs, his abdomen was protuberant, distended, positive for 3 plus pitting bilateral extremity edema, positive scrotal edema. Sodium 140, potassium 3.0, chloride 96, bicarb 29, glucose 310, BUN 23, creatinine 1.0, BNP 1280, white count 9.5, hemoglobin 10.6, hematocrit 36.3, platelets 283. SARSCOVID-19 negative.Tox screen positive for cocaine. Alcohol less than 10. His chest x-ray had cardiomegaly with increased hilar vascular congestion. EKG 105 bpm, normal axis, normal intervals, sinus rhythm, no ST segment changes, chronicT wave inversions in V5 to V6.Poor R wave progression.  Patient was admitted to the hospital working diagnosis of acute decompensation of systolic heart failure.  For further details about his hospitalization please see daily progress note.  Patient has signed himself AGAINST MEDICAL ADVICE.  He understands the risk of leaving the hospital, including recurrent heart failure,  respiratory failure and death.  He has a plan to go to his cousin, and sign himself in a group home, he received all information from social services.  He will get his prescription for furosemide, dose was increased to 60 mg daily.  He is not willing to stay  in the hospital, he was offered further anxiety medication and address his concerns in order to keep him in the hospital but he argues that he is feeling well and he needs to leave the hospital.  He seems competent in making his decision even though I think he needs to stay in hospital for further treatment and preventive hospitalizations.  I explained him my concerns but he is adamant about leaving the hospital.  He was able to dress himself and walk out of his room.    Discharge Diagnoses:  Active Problems:   Insulin-requiring or dependent type II diabetes mellitus (HCC)   Essential hypertension, benign   Cocaine use disorder, severe, dependence (HCC)   Chronic paranoid schizophrenia (HCC)   Schizophrenia, paranoid type (HCC)   Chronic foot pain   Acute on chronic combined systolic (congestive) and diastolic (congestive) heart failure (HCC)   Homelessness   Tobacco use   Acute respiratory failure with hypoxia (HCC)   Sleep apnea   Adjustment disorder with mixed disturbance of emotions and conduct   Penile cellulitis   HLD (hyperlipidemia)   Anxiety   Systolic and diastolic CHF, acute on chronic (HCC)   Pressure injury of skin    Discharge Instructions     Allergies  Allergen Reactions  . Haldol [Haloperidol] Other (See Comments)    Muscle spasms, loss of voluntary movement. However, pt has taken Thorazine on multiple occasions with no adverse effects.     Consultations:    Procedures/Studies: Dg Chest 2 View  Result Date: 11/05/2018 CLINICAL DATA:  Shortness  of breath EXAM: CHEST - 2 VIEW COMPARISON:  11/04/2018, 10/26/2018, 10/17/2018 FINDINGS: Cardiomegaly with vascular congestion and mild diffuse interstitial  process, likely edema. Small bilateral pleural effusions. No pneumothorax. Linear atelectasis or scar in the left mid lung. IMPRESSION: Cardiomegaly with vascular congestion, mild interstitial edema and small bilateral pleural effusions. Electronically Signed   By: Jasmine PangKim  Fujinaga M.D.   On: 11/05/2018 17:57   Ct Abdomen Pelvis W Contrast  Result Date: 10/12/2018 CLINICAL DATA:  58 y/o M; bilateral testicular pain and swelling for 1 month. History of polysubstance abuse, hepatitis-C, diabetes. EXAM: CT ABDOMEN AND PELVIS WITH CONTRAST TECHNIQUE: Multidetector CT imaging of the abdomen and pelvis was performed using the standard protocol following bolus administration of intravenous contrast. CONTRAST:  100mL OMNIPAQUE IOHEXOL 300 MG/ML  SOLN COMPARISON:  07/23/2018 CT abdomen and pelvis. FINDINGS: Lower chest: Increased moderate cardiomegaly. Stable metallic density within the anterior aspect of right middle lobe. Hepatobiliary: No focal liver abnormality is seen. No gallstones or biliary ductal dilatation. Small volume of fluid within the gallbladder fossa is likely related to perihepatic ascites. Pancreas: Unremarkable. No pancreatic ductal dilatation or surrounding inflammatory changes. Spleen: Normal in size without focal abnormality. Adrenals/Urinary Tract: Adrenal glands are unremarkable. Stable right kidney cysts measuring up to 4.0 cm arising from the lower pole. Kidneys are otherwise normal, without renal calculi, focal lesion, or hydronephrosis. Diffuse bladder wall thickening. Stomach/Bowel: Stomach is within normal limits. Appendix appears normal. No evidence of bowel wall thickening, distention, or inflammatory changes. Vascular/Lymphatic: Aortic atherosclerosis. No enlarged abdominal or pelvic lymph nodes. Reproductive: Stable severe enlargement of the prostate. Other: Small volume of peritoneal ascites. Diffuse edema throughout the subcutaneous fat predominantly of lower abdomen and visible pelvis.  Musculoskeletal: No fracture is seen. Stable well-marginated sclerotic foci within the pelvis compatible with bone islands. IMPRESSION: 1. Small volume of ascites and diffuse edema throughout subcutaneous fat predominantly of lower abdomen and visible pelvis. Interval increased cardiomegaly favors heart failure as the etiology. 2. Stable prostate enlargement and bladder wall thickening likely due to chronic outflow obstruction. 3.  Aortic Atherosclerosis (ICD10-I70.0). Electronically Signed   By: Mitzi HansenLance  Furusawa-Stratton M.D.   On: 10/12/2018 01:49   Dg Chest Portable 1 View  Result Date: 11/07/2018 CLINICAL DATA:  Pt came to the ED with complaints of bilateral legs swelling, testicle pain and weakness PT HX: current smoker, DM, HTNleft swelling, shortness of breath; h/o of CHF EXAM: PORTABLE CHEST 1 VIEW COMPARISON:  11/06/2018 FINDINGS: Stable enlarged cardiac silhouette. No effusion, infiltrate pneumothorax. Improvement in central venous congestion pattern seen on comparison exam. IMPRESSION: Stable cardiomegaly. Improvement in central venous pulmonary congestion Electronically Signed   By: Genevive BiStewart  Edmunds M.D.   On: 11/07/2018 05:12   Dg Chest Port 1 View  Result Date: 11/06/2018 CLINICAL DATA:  Shortness of breath, cough EXAM: PORTABLE CHEST 1 VIEW COMPARISON:  11/05/2018 FINDINGS: Cardiomegaly. Bilateral perihilar and lower lobe opacities, with interstitial prominence. Findings likely reflect edema/CHF. No visible significant effusions or acute bony abnormality. IMPRESSION: Mild to moderate CHF. Electronically Signed   By: Charlett NoseKevin  Dover M.D.   On: 11/06/2018 01:01   Dg Chest Portable 1 View  Result Date: 11/04/2018 CLINICAL DATA:  Bilateral leg swelling, shortness of breath EXAM: PORTABLE CHEST 1 VIEW COMPARISON:  10/26/2018 FINDINGS: Cardiomegaly with vascular congestion. Interstitial prominence likely reflects early interstitial edema. Right base atelectasis. No significant effusions or acute bony  abnormality. IMPRESSION: Cardiomegaly with vascular congestion and probable early interstitial edema. Electronically Signed   By: Charlett NoseKevin  Dover  M.D.   On: 11/04/2018 03:09   Dg Chest Portable 1 View  Result Date: 10/26/2018 CLINICAL DATA:  Shortness of breath. EXAM: PORTABLE CHEST 1 VIEW COMPARISON:  One-view chest x-ray 10/17/2018 FINDINGS: Heart is enlarged. A mild diffuse interstitial pattern is present. Linear atelectasis in the left midlung is stable. No significant airspace consolidation is present. There are no significant effusions or pneumothorax. IMPRESSION: 1. Cardiomegaly with mild interstitial edema, suggesting congestive heart failure. 2. Stable linear atelectasis in the left midlung. Electronically Signed   By: Marin Roberts M.D.   On: 10/26/2018 07:09   Dg Chest Portable 1 View  Result Date: 10/17/2018 CLINICAL DATA:  Shortness of breath. EXAM: PORTABLE CHEST 1 VIEW COMPARISON:  10/09/2018 FINDINGS: The cardio pericardial silhouette is enlarged. There is pulmonary vascular congestion with interstitial pulmonary edema. Chronic atelectasis or linear scar in the left mid lung is similar to prior. The visualized bony structures of the thorax are intact. Insert wire IMPRESSION: Cardiomegaly with vascular congestion and probable interstitial edema. Electronically Signed   By: Kennith Center M.D.   On: 10/17/2018 23:14      Procedures:   Subjective:   Discharge Exam: Vitals:   11/09/18 0451 11/09/18 1351  BP: 132/84 120/79  Pulse: 85 86  Resp: 20 19  Temp: 97.7 F (36.5 C) 98.4 F (36.9 C)  SpO2: (!) 89% 92%   Vitals:   11/08/18 2052 11/09/18 0451 11/09/18 0623 11/09/18 1351  BP: 134/75 132/84  120/79  Pulse: 90 85  86  Resp: 20 20  19   Temp: 98.6 F (37 C) 97.7 F (36.5 C)  98.4 F (36.9 C)  TempSrc: Oral Oral  Axillary  SpO2: 96% (!) 89%  92%  Weight:   104.9 kg   Height:           The results of significant diagnostics from this hospitalization  (including imaging, microbiology, ancillary and laboratory) are listed below for reference.     Microbiology: Recent Results (from the past 240 hour(s))  SARS Coronavirus 2 (CEPHEID - Performed in [facility] hospital lab), Hosp Order     Status: None   Collection Time: 11/07/18  1:54 PM  Result Value Ref Range Status   SARS Coronavirus 2 NEGATIVE NEGATIVE Final    Comment: (NOTE) If result is NEGATIVE SARS-CoV-2 target nucleic acids are NOT DETECTED. The SARS-CoV-2 RNA is generally detectable in upper and lower  respiratory specimens during the acute phase of infection. The lowest  concentration of SARS-CoV-2 viral copies this assay can detect is 250  copies / mL. A negative result does not preclude SARS-CoV-2 infection  and should not be used as the sole basis for treatment or other  patient management decisions.  A negative result may occur with  improper specimen collection / handling, submission of specimen other  than nasopharyngeal swab, presence of viral mutation(s) within the  areas targeted by this assay, and inadequate number of viral copies  (<250 copies / mL). A negative result must be combined with clinical  observations, patient history, and epidemiological information. If result is POSITIVE SARS-CoV-2 target nucleic acids are DETECTED. The SARS-CoV-2 RNA is generally detectable in upper and lower  respiratory specimens dur ing the acute phase of infection.  Positive  results are indicative of active infection with SARS-CoV-2.  Clinical  correlation with patient history and other diagnostic information is  necessary to determine patient infection status.  Positive results do  not rule out bacterial infection or co-infection with other viruses.  If result is PRESUMPTIVE POSTIVE SARS-CoV-2 nucleic acids MAY BE PRESENT.   A presumptive positive result was obtained on the submitted specimen  and confirmed on repeat testing.  While 2019 novel coronavirus  (SARS-CoV-2)  nucleic acids may be present in the submitted sample  additional confirmatory testing may be necessary for epidemiological  and / or clinical management purposes  to differentiate between  SARS-CoV-2 and other Sarbecovirus currently known to infect humans.  If clinically indicated additional testing with an alternate test  methodology [phone]) is advised. The SARS-CoV-2 RNA is generally  detectable in upper and lower respiratory sp ecimens during the acute  phase of infection. The expected result is Negative. Fact Sheet for Patients:  BoilerBrush.com.cy Fact Sheet for Healthcare Providers: https://pope.com/ This test is not yet approved or cleared by the [place] FDA and has been authorized for detection and/or diagnosis of SARS-CoV-2 by FDA under an Emergency Use Authorization (EUA).  This EUA will remain in effect (meaning this test can be used) for the duration of the COVID-19 declaration under Section 564(b)(1) of the Act, 21 U.S.C. section 360bbb-3(b)(1), unless the authorization is terminated or revoked sooner. Performed at [facility], [address], [place], [place] [address]   MRSA PCR Screening     Status: Abnormal   Collection Time: 11/07/18  2:04 PM  Result Value Ref Range Status   MRSA by PCR POSITIVE (A) NEGATIVE Final    Comment:        The GeneXpert MRSA Assay (FDA approved for NASAL specimens only), is one component of a comprehensive MRSA colonization surveillance program. It is not intended to diagnose MRSA infection nor to guide or monitor treatment for MRSA infections. RESULT CALLED TO, READ BACK BY AND VERIFIED WITH: Z OSIPENKO,RN 11/07/18 1710 RHOLMES Performed at [facility], [address] [place], [place] [address]      Labs: BNP (last 3 results) Recent Labs    11/05/18 1844 11/06/18 0052 11/07/18 0435  BNP 1,228.9* 993.1* 1,280.4*   Basic  Metabolic Panel: Recent Labs  Lab 11/05/18 1844 11/06/18 0052 11/07/18 0435 11/07/18 0714 11/07/18 1203 11/08/18 0223 11/09/18 0444  NA 142 137 140  --   --  140 140  K 3.5 3.6 3.0*  --   --  3.4* 4.1  CL 103 104 96*  --   --  100 101  CO2 --   --  31 29  GLUCOSE 199* 317* 310*  --   --  215* 144*  BUN 18 20 23*  --   --  23* 32*  CREATININE 1.01 0.98 1.03  --  0.92 0.97 1.38*  CALCIUM 8.8* 8.5* 8.8*  --   --  8.4* 8.8*  MG  --  1.9  --  1.9  --  1.9 2.0   Liver Function Tests: No results for input(s): AST, ALT, ALKPHOS, BILITOT, PROT, ALBUMIN in the last 168 hours. No results for input(s): LIPASE, AMYLASE in the last 168 hours. No results for input(s): AMMONIA in the last 168 hours. CBC: Recent Labs  Lab 11/04/18 0241 11/05/18 1844 11/06/18 0052 11/07/18 0435 11/07/18 1203  WBC 7.1 8.0 7.4 9.5 8.2  NEUTROABS 5.4 5.8 5.8  --   --   HGB 9.8* 9.3* 9.4* 10.6* 9.7*  HCT 32.6* 32.3* 32.7* 36.3* 33.0*  MCV 81.7 83.9 85.4 83.4 83.5  PLT 268 256 227 283 250   Cardiac Enzymes: Recent Labs  Lab 11/07/18 0435 11/07/18  0714  TROPONINI 0.04* 0.04*   BNP: Invalid input(s): POCBNP CBG: Recent Labs  Lab 11/09/18 0011 11/09/18 0444 11/09/18 0742 11/09/18 1141 11/09/18 1449  GLUCAP 197* 132* 172* 365* 271*   D-Dimer No results for input(s): DDIMER in the last 72 hours. Hgb A1c No results for input(s): HGBA1C in the last 72 hours. Lipid Profile No results for input(s): CHOL, HDL, LDLCALC, TRIG, CHOLHDL, LDLDIRECT in the last 72 hours. Thyroid function studies No results for input(s): TSH, T4TOTAL, T3FREE, THYROIDAB in the last 72 hours.  Invalid input(s): FREET3 Anemia work up No results for input(s): VITAMINB12, FOLATE, FERRITIN, TIBC, IRON, RETICCTPCT in the last 72 hours. Urinalysis    Component Value Date/Time   COLORURINE STRAW (A) 10/12/2018 0156   APPEARANCEUR CLEAR 10/12/2018 0156   LABSPEC 1.011 10/12/2018 0156   PHURINE 7.0 10/12/2018 0156    GLUCOSEU 50 (A) 10/12/2018 0156   HGBUR NEGATIVE 10/12/2018 0156   BILIRUBINUR NEGATIVE 10/12/2018 0156   KETONESUR NEGATIVE 10/12/2018 0156   PROTEINUR 30 (A) 10/12/2018 0156   UROBILINOGEN 1.0 03/14/2015 0610   NITRITE NEGATIVE 10/12/2018 0156   LEUKOCYTESUR NEGATIVE 10/12/2018 0156   Sepsis Labs Invalid input(s): PROCALCITONIN,  WBC,  LACTICIDVEN Microbiology Recent Results (from the past 240 hour(s))  SARS Coronavirus 2 (CEPHEID - Performed in [facility] hospital lab), Hosp Order     Status: None   Collection Time: 11/07/18  1:54 PM  Result Value Ref Range Status   SARS Coronavirus 2 NEGATIVE NEGATIVE Final    Comment: (NOTE) If result is NEGATIVE SARS-CoV-2 target nucleic acids are NOT DETECTED. The SARS-CoV-2 RNA is generally detectable in upper and lower  respiratory specimens during the acute phase of infection. The lowest  concentration of SARS-CoV-2 viral copies this assay can detect is 250  copies / mL. A negative result does not preclude SARS-CoV-2 infection  and should not be used as the sole basis for treatment or other  patient management decisions.  A negative result may occur with  improper specimen collection / handling, submission of specimen other  than nasopharyngeal swab, presence of viral mutation(s) within the  areas targeted by this assay, and inadequate number of viral copies  (<250 copies / mL). A negative result must be combined with clinical  observations, patient history, and epidemiological information. If result is POSITIVE SARS-CoV-2 target nucleic acids are DETECTED. The SARS-CoV-2 RNA is generally detectable in upper and lower  respiratory specimens dur ing the acute phase of infection.  Positive  results are indicative of active infection with SARS-CoV-2.  Clinical  correlation with patient history and other diagnostic information is  necessary to determine patient infection status.  Positive results do  not rule out bacterial infection  or co-infection with other viruses. If result is PRESUMPTIVE POSTIVE SARS-CoV-2 nucleic acids MAY BE PRESENT.   A presumptive positive result was obtained on the submitted specimen  and confirmed on repeat testing.  While 2019 novel coronavirus  (SARS-CoV-2) nucleic acids may be present in the submitted sample  additional confirmatory testing may be necessary for epidemiological  and / or clinical management purposes  to differentiate between  SARS-CoV-2 and other Sarbecovirus currently known to infect humans.  If clinically indicated additional testing with an alternate test  methodology [phone]) is advised. The SARS-CoV-2 RNA is generally  detectable in upper and lower respiratory sp ecimens during the acute  phase of infection. The expected result is Negative. Fact Sheet for Patients:  BoilerBrush.com.cy Fact Sheet for Healthcare Providers: https://pope.com/ This  test is not yet approved or cleared by the [place] and has been authorized for detection and/or diagnosis of SARS-CoV-2 by FDA under an Emergency Use Authorization (EUA).  This EUA will remain in effect (meaning this test can be used) for the duration of the COVID-19 declaration under Section 564(b)(1) of the Act, 21 U.S.C. section 360bbb-3(b)(1), unless the authorization is terminated or revoked sooner. Performed at [facility], [address] [place], [place] [address]   MRSA PCR Screening     Status: Abnormal   Collection Time: 11/07/18  2:04 PM  Result Value Ref Range Status   MRSA by PCR POSITIVE (A) NEGATIVE Final    Comment:        The GeneXpert MRSA Assay (FDA approved for NASAL specimens only), is one component of a comprehensive MRSA colonization surveillance program. It is not intended to diagnose MRSA infection nor to guide or monitor treatment for MRSA infections. RESULT CALLED TO, READ BACK BY AND VERIFIED WITH: Z  OSIPENKO,RN 11/07/18 1710 RHOLMES Performed at [facility], [address] [place], [place] [address]      Time coordinating discharge: 45 minutes  SIGNED:   Coralie Keens, MD  [facility] 11/09/2018, 4:49 PM

## 2018-11-09 NOTE — Progress Notes (Signed)
PROGRESS NOTE    RUBIN DAIS  WUJ:[id number] DOB: [birth year] DOA: 11/07/2018 PCP: Patient, No Pcp Per    Brief Narrative:  58 year old male who presented with acute on chronic systolic heart failure.  He does have significant past medical history for paranoid schizophrenia, substance abuse, hypertension, type 2 diabetes mellitus and hepatitis C. he complaint of bilateral lower extremity edema, testicular swelling and generalized weakness.  Patient had about 13 admissions for the last 6 months.  On his initial physical examination his blood pressure was 157/89, heart rate 88, respiratory rate 19, temperature 98, oxygen saturation 91%, his lungs are clear to auscultation bilaterally, heart S1-S2 present and rhythmic, no gallops, rubs or murmurs, his abdomen was protuberant, distended, positive for plus pitting bilateral extremity edema, positive scrotal edema.  Sodium 140, potassium 3.0, chloride 96, bicarb 29, glucose 310, BUN 23, creatinine 1.0, BNP 1280, white count 9.5, hemoglobin 10.6, hematocrit 36.3, platelets 283. SARS COVID-19 negative.  Tox screen positive for cocaine.  Alcohol less than 10.  His chest x-ray had cardiomegaly with increased hilar vascular congestion.  EKG 105 bpm, normal axis, normal intervals, sinus rhythm, no ST segment changes, chronicT wave inversions in V5 to V6.  Poor R wave progression.  Patient was admitted to the hospital working diagnosis of acute decompensation of systolic heart failure.   Assessment & Plan:   Active Problems:   Insulin-requiring or dependent type II diabetes mellitus (HCC)   Essential hypertension, benign   Cocaine use disorder, severe, dependence (HCC)   Chronic paranoid schizophrenia (HCC)   Schizophrenia, paranoid type (HCC)   Chronic foot pain   Acute on chronic combined systolic (congestive) and diastolic (congestive) heart failure (HCC)   Homelessness   Tobacco use   Acute respiratory failure with hypoxia (HCC)   Sleep  apnea   Adjustment disorder with mixed disturbance of emotions and conduct   Penile cellulitis   HLD (hyperlipidemia)   Anxiety   Systolic and diastolic CHF, acute on chronic (HCC)   Pressure injury of skin  1. Acute on chronic decompensated systolic heart failure. Patient with improved volume status but still not back to baseline. Urine output over last 24 H, 3,035 ml,. Blood pressure 132 to 134 mmHg, will continue diuresis with IV furosemide 80 mg IV bid and carvedilol for B blockade, hold on ace inh due to risk of worsening renal function and hypotension.   2. HTN. Improved blood pressure with diuresis, will continue carvedilol. Holding on ace inh or arb for now.   3. T2Dm. Glucose cover and monitoring with insulin sliding scale, patient is tolerating po and requesting an unrestricted diet. His fasting glucose today is 144 mg/dl.   4. Scrotal cellulitis ruled out. No off antibiotic therapy.   5. Cocaine intoxication and tobacco abuse. Tolerating well carvedilol, will continue nicotine patch. No signs of withdrawal.   6. Schizophrenia/ paranoid. Patient has been tolerating well lorazepam, to continue as needed for agitation. Continue aripiprazole and carbamazepine.    7. Pressure skin lesions, present on admission. Stage 2 at the sacrum and right hip, continue local wound care.   8. Obesity. Calculated BMI is 32,2.   DVT prophylaxis: enoxaparin   Code Status: full Family Communication: no family at the bedside  Disposition Plan/ discharge barriers: pending clinical improvement.     Body mass index is 32.25 kg/m. Malnutrition Type:  Nutrition Problem: Increased nutrient needs Etiology: wound healing   Malnutrition Characteristics:  Signs/Symptoms: estimated needs   Nutrition Interventions:  Interventions: Ensure  Enlive (each supplement provides 350kcal and 20 grams of protein), Prostat  RN Pressure Injury Documentation: Pressure Injury 11/07/18 Stage II -   Partial thickness loss of dermis presenting as a shallow open ulcer with a red, pink wound bed without slough. small dime sized area (Active)  11/07/18 1532  Location: Sacrum  Location Orientation: Mid  Staging: Stage II -  Partial thickness loss of dermis presenting as a shallow open ulcer with a red, pink wound bed without slough.  Wound Description (Comments): small dime sized area  Present on Admission: Yes     Pressure Injury 11/07/18 Stage II -  Partial thickness loss of dermis presenting as a shallow open ulcer with a red, pink wound bed without slough. small dime sized area (Active)  11/07/18 1535  Location: Hip  Location Orientation: Right  Staging: Stage II -  Partial thickness loss of dermis presenting as a shallow open ulcer with a red, pink wound bed without slough.  Wound Description (Comments): small dime sized area  Present on Admission: Yes     Consultants:     Procedures:     Antimicrobials:       Subjective: This am he is feeling better, but not yet back to baseline, continue to have dyspnea and lower extremity edema, no nausea or vomiting.   Objective: Vitals:   11/08/18 1258 11/08/18 2052 11/09/18 0451 11/09/18 0623  BP: 120/74 134/75 132/84   Pulse: 91 90 85   Resp: 20 20 20    Temp: 97.6 F (36.4 C) 98.6 F (37 C) 97.7 F (36.5 C)   TempSrc: Oral Oral Oral   SpO2: 95% 96% (!) 89%   Weight:    104.9 kg  Height:        Intake/Output Summary (Last 24 hours) at 11/09/2018 1005 Last data filed at 11/09/2018 0900 Gross per 24 hour  Intake 118 ml  Output 3835 ml  Net -3717 ml   Filed Weights   11/07/18 0418 11/09/18 0623  Weight: 97.5 kg 104.9 kg    Examination:   General: deconditioned  Neurology: Awake and alert, non focal  E ENT: no pallor, no icterus, oral mucosa moist Cardiovascular: Mild JVD. S1-S2 present, rhythmic, no gallops, rubs, or murmurs. +++ pitting lower extremity edema. Pulmonary: positive breath sounds bilaterally,  adequate air movement, no wheezing, rhonchi or rales. Gastrointestinal. Abdomen protuberant with no organomegaly, non tender, no rebound or guarding Skin. No rashes Musculoskeletal: no joint deformities Scrotal edema.     Data Reviewed: I have personally reviewed following labs and imaging studies  CBC: Recent Labs  Lab 11/04/18 0241 11/05/18 1844 11/06/18 0052 11/07/18 0435 11/07/18 1203  WBC 7.1 8.0 7.4 9.5 8.2  NEUTROABS 5.4 5.8 5.8  --   --   HGB 9.8* 9.3* 9.4* 10.6* 9.7*  HCT 32.6* 32.3* 32.7* 36.3* 33.0*  MCV 81.7 83.9 85.4 83.4 83.5  PLT 268 256 227 283 250   Basic Metabolic Panel: Recent Labs  Lab 11/05/18 1844 11/06/18 0052 11/07/18 0435 11/07/18 0714 11/07/18 1203 11/08/18 0223 11/09/18 0444  NA 142 137 140  --   --  140 140  K 3.5 3.6 3.0*  --   --  3.4* 4.1  CL 103 104 96*  --   --  100 101  CO2 31 26 29   --   --  31 29  GLUCOSE 199* 317* 310*  --   --  215* 144*  BUN 18 20 23*  --   --  23*  32*  CREATININE 1.01 0.98 1.03  --  0.92 0.97 1.38*  CALCIUM 8.8* 8.5* 8.8*  --   --  8.4* 8.8*  MG  --  1.9  --  1.9  --  1.9 2.0   GFR: Estimated Creatinine Clearance: 72.8 mL/min (A) (by C-G formula based on SCr of 1.38 mg/dL (H)). Liver Function Tests: No results for input(s): AST, ALT, ALKPHOS, BILITOT, PROT, ALBUMIN in the last 168 hours. No results for input(s): LIPASE, AMYLASE in the last 168 hours. No results for input(s): AMMONIA in the last 168 hours. Coagulation Profile: No results for input(s): INR, PROTIME in the last 168 hours. Cardiac Enzymes: Recent Labs  Lab 11/07/18 0435 11/07/18 0714  TROPONINI 0.04* 0.04*   BNP (last 3 results) No results for input(s): PROBNP in the last 8760 hours. HbA1C: No results for input(s): HGBA1C in the last 72 hours. CBG: Recent Labs  Lab 11/08/18 1627 11/08/18 2046 11/09/18 0011 11/09/18 0444 11/09/18 0742  GLUCAP 222* 224* 197* 132* 172*   Lipid Profile: No results for input(s): CHOL, HDL,  LDLCALC, TRIG, CHOLHDL, LDLDIRECT in the last 72 hours. Thyroid Function Tests: No results for input(s): TSH, T4TOTAL, FREET4, T3FREE, THYROIDAB in the last 72 hours. Anemia Panel: No results for input(s): VITAMINB12, FOLATE, FERRITIN, TIBC, IRON, RETICCTPCT in the last 72 hours.    Radiology Studies: I have reviewed all of the imaging during this hospital visit personally     Scheduled Meds: . ARIPiprazole  10 mg Oral Daily  . aspirin EC  81 mg Oral Daily  . atorvastatin  80 mg Oral q1800  . carbamazepine  100 mg Oral BID  . carvedilol  6.25 mg Oral BID WC  . Chlorhexidine Gluconate Cloth  6 each Topical Q0600  . enoxaparin (LOVENOX) injection  40 mg Subcutaneous Q24H  . feeding supplement (ENSURE ENLIVE)  237 mL Oral Q24H  . feeding supplement (PRO-STAT SUGAR FREE 64)  30 mL Oral BID  . furosemide  80 mg Intravenous BID  . insulin aspart  0-15 Units Subcutaneous Q4H  . mupirocin ointment  1 application Nasal BID  . sodium chloride flush  3 mL Intravenous Q12H  . tamsulosin  0.4 mg Oral QPC breakfast   Continuous Infusions: . sodium chloride       LOS: 2 days        Mauricio Annett Gulaaniel Arrien, MD

## 2018-11-09 NOTE — Progress Notes (Signed)
Patient called RN stating, " I want that paper, I need to go." RN asked if he was saying he wanted to leave Against Medical Advice and he said yes. RN paged MD Arrien about patient wanting to leave AMA. MD came to talk with patient to try to get him to stay but patient still refused to stay. RN took out IV successfully and paper signed AMA paper that was placed in patient's chart. Patient was escorted out by security to find the bus stop.

## 2018-11-10 ENCOUNTER — Emergency Department (HOSPITAL_COMMUNITY)
Admission: EM | Admit: 2018-11-10 | Discharge: 2018-11-10 | Disposition: A | Discharge status: Home / Self Care | Payer: Medicaid Other | Attending: Emergency Medicine | Admitting: Emergency Medicine

## 2018-11-10 ENCOUNTER — Emergency Department (HOSPITAL_COMMUNITY): Payer: Medicaid Other

## 2018-11-10 ENCOUNTER — Encounter (HOSPITAL_COMMUNITY): Payer: Self-pay | Admitting: Family Medicine

## 2018-11-10 DIAGNOSIS — Z79899 Other long term (current) drug therapy: Secondary | ICD-10-CM | POA: Insufficient documentation

## 2018-11-10 DIAGNOSIS — R739 Hyperglycemia, unspecified: Secondary | ICD-10-CM | POA: Diagnosis present

## 2018-11-10 DIAGNOSIS — E1165 Type 2 diabetes mellitus with hyperglycemia: Secondary | ICD-10-CM | POA: Diagnosis not present

## 2018-11-10 DIAGNOSIS — I5042 Chronic combined systolic (congestive) and diastolic (congestive) heart failure: Secondary | ICD-10-CM | POA: Insufficient documentation

## 2018-11-10 DIAGNOSIS — F17210 Nicotine dependence, cigarettes, uncomplicated: Secondary | ICD-10-CM | POA: Diagnosis not present

## 2018-11-10 DIAGNOSIS — R609 Edema, unspecified: Secondary | ICD-10-CM

## 2018-11-10 DIAGNOSIS — Z7982 Long term (current) use of aspirin: Secondary | ICD-10-CM | POA: Diagnosis not present

## 2018-11-10 DIAGNOSIS — Z794 Long term (current) use of insulin: Secondary | ICD-10-CM | POA: Insufficient documentation

## 2018-11-10 DIAGNOSIS — R600 Localized edema: Secondary | ICD-10-CM | POA: Insufficient documentation

## 2018-11-10 DIAGNOSIS — Z5900 Homelessness unspecified: Secondary | ICD-10-CM

## 2018-11-10 DIAGNOSIS — F1910 Other psychoactive substance abuse, uncomplicated: Secondary | ICD-10-CM | POA: Insufficient documentation

## 2018-11-10 DIAGNOSIS — I110 Hypertensive heart disease with heart failure: Secondary | ICD-10-CM | POA: Insufficient documentation

## 2018-11-10 LAB — CBC
HCT: 32.4 % — ABNORMAL LOW (ref 39.0–52.0)
Hemoglobin: 9.6 g/dL — ABNORMAL LOW (ref 13.0–17.0)
MCH: 24.4 pg — ABNORMAL LOW (ref 26.0–34.0)
MCHC: 29.6 g/dL — ABNORMAL LOW (ref 30.0–36.0)
MCV: 82.4 fL (ref 80.0–100.0)
Platelets: 227 10*3/uL (ref 150–400)
RBC: 3.93 MIL/uL — ABNORMAL LOW (ref 4.22–5.81)
RDW: 19.9 % — ABNORMAL HIGH (ref 11.5–15.5)
WBC: 9 10*3/uL (ref 4.0–10.5)
nRBC: 0 % (ref 0.0–0.2)

## 2018-11-10 LAB — COMPREHENSIVE METABOLIC PANEL
ALT: 16 U/L (ref 0–44)
AST: 24 U/L (ref 15–41)
Albumin: 2.8 g/dL — ABNORMAL LOW (ref 3.5–5.0)
Alkaline Phosphatase: 128 U/L — ABNORMAL HIGH (ref 38–126)
Anion gap: 7 (ref 5–15)
BUN: 36 mg/dL — ABNORMAL HIGH (ref 6–20)
CO2: 31 mmol/L (ref 22–32)
Calcium: 8.6 mg/dL — ABNORMAL LOW (ref 8.9–10.3)
Chloride: 99 mmol/L (ref 98–111)
Creatinine, Ser: 1.18 mg/dL (ref 0.61–1.24)
GFR calc Af Amer: >60 mL/min (ref >60)
GFR calc non Af Amer: >60 mL/min (ref >60)
Glucose, Bld: 329 mg/dL — ABNORMAL HIGH (ref 70–99)
Potassium: 3.7 mmol/L (ref 3.5–5.1)
Sodium: 137 mmol/L (ref 135–145)
Total Bilirubin: 0.7 mg/dL (ref 0.3–1.2)
Total Protein: 6.6 g/dL (ref 6.5–8.1)

## 2018-11-10 LAB — CBG MONITORING, ED
Glucose-Capillary: 339 mg/dL — ABNORMAL HIGH (ref 70–99)
Glucose-Capillary: 370 mg/dL — ABNORMAL HIGH (ref 70–99)

## 2018-11-10 LAB — BRAIN NATRIURETIC PEPTIDE: B Natriuretic Peptide: 877.1 pg/mL — ABNORMAL HIGH (ref 0.0–100.0)

## 2018-11-10 LAB — TROPONIN I: Troponin I: 0.04 ng/mL (ref <0.03)

## 2018-11-10 MED ORDER — INSULIN GLARGINE 100 UNIT/ML ~~LOC~~ SOLN
14.0000 [IU] | Freq: Once | SUBCUTANEOUS | Status: AC
  Administered 2018-11-10: 20:00:00 14 [IU] via SUBCUTANEOUS
  Filled 2018-11-10: qty 0.14

## 2018-11-10 MED ORDER — ACETAMINOPHEN 500 MG PO TABS
1000.0000 mg | ORAL_TABLET | Freq: Once | ORAL | Status: AC
  Administered 2018-11-10: 17:00:00 1000 mg via ORAL
  Filled 2018-11-10: qty 2

## 2018-11-10 MED ORDER — FUROSEMIDE 10 MG/ML IJ SOLN
40.0000 mg | INTRAMUSCULAR | Status: AC
  Administered 2018-11-10: 17:00:00 40 mg via INTRAVENOUS
  Filled 2018-11-10: qty 4

## 2018-11-10 MED ORDER — NICOTINE 21 MG/24HR TD PT24
21.0000 mg | MEDICATED_PATCH | Freq: Once | TRANSDERMAL | Status: DC
  Administered 2018-11-10: 17:00:00 21 mg via TRANSDERMAL
  Filled 2018-11-10: qty 1

## 2018-11-10 MED ORDER — INSULIN ASPART 100 UNIT/ML ~~LOC~~ SOLN
4.0000 [IU] | Freq: Once | SUBCUTANEOUS | Status: AC
  Administered 2018-11-10: 4 [IU] via SUBCUTANEOUS
  Filled 2018-11-10: qty 1

## 2018-11-10 NOTE — ED Notes (Signed)
Patient was escorted off property with security with new clothes and food.

## 2018-11-10 NOTE — ED Notes (Signed)
Patient requested dinner tray. Dr. Clarene Duke gave a verbal order for a diet order of low sodium and cardiac. Informed Misty Stanley, MUS of this requestion.

## 2018-11-10 NOTE — ED Notes (Signed)
Patient had a bowel movement and had attempted to throw his diaper in the trash but missed. Patient stood up with assistance to clean himself. Placed a new diaper on patient and replaced incontinence pads.

## 2018-11-10 NOTE — ED Notes (Signed)
Provided patient one milk, one pack of peanut butter, and one graham cracker packet.

## 2018-11-10 NOTE — ED Notes (Signed)
Bed: XQ82 Expected date:  Expected time:  Means of arrival:  Comments: EMS-hyperglycemia/weak

## 2018-11-10 NOTE — ED Notes (Signed)
Pt called out stating he needed to urinate.  Urinal given to pt and door closed for privacy.

## 2018-11-10 NOTE — ED Notes (Signed)
Patient is currently in the shower cleaning himself from the feces on him. Dr. Clarene Duke is aware that patient is taking a shower and a delay in chest x-ray. Raeanne Gathers NT is also assisting patient.

## 2018-11-10 NOTE — ED Provider Notes (Signed)
[facility]-EMERGENCY DEPT Provider Note   CSN: [id number] Arrival date & time: 11/10/18  1459    History   Chief Complaint Chief Complaint  Patient presents with  . Hyperglycemia    HPI Mellody MemosFrederick L Blow is a 58 y.o. male.     58yo M w/ extensive PMH including substance abuse, homelessness, T2DM, CHF, schizophrenia, HTN who p/w edema. PT was admitted to the hospital and left AMA today after admission for CHF. He states he smoked cigarettes, drank a beer, and had a small amount of crack. He states "I got what I want so I want to come back." He returns stating he doesn't feel well and his abdomen and legs are still swollen, similar to previous episodes w/ CHF exacerbation. He continues to have some SOB.   The history is provided by the patient.  Hyperglycemia    Past Medical History:  Diagnosis Date  . Chronic foot pain   . Cocaine abuse (HCC)   . Diabetes mellitus without complication (HCC)   . Hepatitis C    unsure   . Homelessness   . Hypertension   . Neuropathy   . Polysubstance abuse (HCC)   . Schizophrenia (HCC)   . Sleep apnea   . Systolic and diastolic CHF, chronic [facility](HCC)     Patient Active Problem List   Diagnosis Date Noted  . Pressure injury of skin 11/08/2018  . Systolic and diastolic CHF, acute on chronic (HCC) 11/07/2018  . Elevated troponin 10/18/2018  . HLD (hyperlipidemia) 10/18/2018  . Anxiety 10/18/2018  . CHF (congestive heart failure), NYHA class II, acute on chronic, combined (HCC) 10/18/2018  . Hypertensive urgency 10/12/2018  . Mild renal insufficiency 10/12/2018  . Cellulitis 10/12/2018  . Penile cellulitis   . Adjustment disorder with mixed disturbance of emotions and conduct   . Sleep apnea 10/03/2018  . Hypokalemia 09/17/2018  . Scrotal swelling 09/17/2018  . Urinary hesitancy   . Constipation   . Acute on chronic combined systolic and diastolic CHF (congestive heart failure) (HCC) 08/25/2018  . Acute  respiratory failure with hypoxia (HCC) 08/25/2018  . Acute on chronic combined systolic and diastolic congestive heart failure (HCC) 08/18/2018  . Tobacco use 08/18/2018  . 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 Homelessness 08/08/2018  . Smoker 08/08/2018  . Acute exacerbation of CHF (congestive heart failure) (HCC) 03/28/2018  . Prostate enlargement 03/16/2018  . Aortic atherosclerosis (HCC) 03/16/2018  . 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 Chronic foot pain   . Schizoaffective disorder, bipolar type (HCC) 09/30/2016  . Substance induced mood disorder (HCC) 03/13/2015  . Acute kidney failure (HCC) 01/26/2015  . Schizophrenia, paranoid type (HCC) 01/17/2015  . Drug hallucinosis (HCC) 10/08/2014  . Chronic paranoid schizophrenia (HCC) 09/07/2014  . 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 Urinary retention   . Cocaine use disorder, severe, dependence (HCC)   . Essential hypertension, benign 03/28/2013  . 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 MULTIPLE TOOTH EXTRACTIONS          Home Medications    Prior to Admission medications   Medication Sig Start Date End Date Taking? Authorizing Provider  ARIPiprazole (ABILIFY) 10 MG tablet Take 1 tablet (10 mg total) by mouth daily. 09/18/18  Yes Meredeth IdeLama, Gagan S, MD  aspirin EC 81 MG  tablet Take 1 tablet (81 mg total) by mouth daily. 10/19/18 02/16/19  Meredeth Ide, MD  atorvastatin (LIPITOR) 40 MG tablet Take 1 tablet (40 mg total) by mouth daily. 09/18/18   Meredeth Ide, MD  bisacodyl (DULCOLAX) 5 MG EC tablet Take 1 tablet (5 mg total) by mouth daily as needed for moderate constipation. 09/04/18   Rodolph Bong, MD  carbamazepine (TEGRETOL) 200 MG tablet Take 0.5 tablets (100 mg total) by mouth 2  (two) times daily. 09/18/18   Meredeth Ide, MD  carvedilol (COREG) 6.25 MG tablet Take 1 tablet (6.25 mg total) by mouth 2 (two) times daily with a meal. 10/19/18   Sharl Ma, Sarina Ill, MD  cephALEXin (KEFLEX) 500 MG capsule Take 1 capsule (500 mg total) by mouth 4 (four) times daily. 10/22/18   Roxy Horseman, PA-C  furosemide (LASIX) 20 MG tablet Take 3 tablets (60 mg total) by mouth daily for 30 days. 11/09/18 12/09/18  Arrien, York Ram, MD  hydrocortisone (PROCTO-PAK) 1 % CREA Apply twice a day to hemorrhoids. 09/29/18   Renne Crigler, PA-C  Insulin Glargine (LANTUS) 100 UNIT/ML Solostar Pen Inject 14 Units into the skin daily. 09/18/18   Meredeth Ide, MD  Insulin Pen Needle 29G X MISC Use with insulin 09/18/18   Meredeth Ide, MD  lisinopril (PRINIVIL,ZESTRIL) 10 MG tablet Take 1 tablet (10 mg total) by mouth daily. 09/18/18   Meredeth Ide, MD  nicotine (NICODERM CQ - DOSED IN MG/24 HOURS) 21 mg/24hr patch Place 1 patch (21 mg total) onto the skin daily. 09/04/18   Rodolph Bong, MD  polyethylene glycol [facility]) packet Take 17 g by mouth 2 (two) times daily. 09/18/18   Meredeth Ide, MD  Potassium Chloride ER 20 MEQ TBCR Take 20 mEq by mouth daily. 09/18/18   Meredeth Ide, MD  senna-docusate (SENOKOT-S) 8.6-50 MG tablet Take 1 tablet by mouth 2 (two) times daily. 09/04/18   Rodolph Bong, MD  tamsulosin [facility]) 0.4 MG CAPS capsule Take 1 capsule (0.4 mg total) by mouth daily after breakfast. 10/05/18   Narda Bonds, MD    Family History Family History  Problem Relation Age of Onset  . Hypertension Other   . Diabetes Other     Social History Social History   Tobacco Use  . Smoking status: Current Every Day Smoker    Packs/day: 1.00    Years: 20.00    Pack years: 20.00    Types: Cigarettes  . Smokeless tobacco: Current User  Substance Use Topics  . Alcohol use: Yes    Comment: Daily Drinker   . Drug use: Yes    Frequency: 7.0 times per week    Types:  "Crack" cocaine, Cocaine, Marijuana     Allergies   Haldol [haloperidol]   Review of Systems Review of Systems All other systems reviewed and are negative except that which was mentioned in HPI   Physical Exam Updated Vital Signs BP (!) 135/91 (BP Location: Right Arm)   Pulse 99   Temp 98.4 F (36.9 C) (Oral)   Resp 20   SpO2 99%   Physical Exam Vitals signs and nursing note reviewed.  Constitutional:      General: He is not in acute distress.    Appearance: He is well-developed.     Comments: Disheveled, chronically ill appearing, covered in feces  HENT:     Head: Normocephalic and atraumatic.  Eyes:  Conjunctiva/sclera: Conjunctivae normal.  Neck:     Musculoskeletal: Neck supple.  Cardiovascular:     Rate and Rhythm: Normal rate and regular rhythm.     Heart sounds: Normal heart sounds. No murmur.  Pulmonary:     Effort: Pulmonary effort is normal.     Breath sounds: Normal breath sounds.  Abdominal:     General: Bowel sounds are normal. There is distension (moderate).     Palpations: Abdomen is soft.     Tenderness: There is no abdominal tenderness.  Musculoskeletal:     Right lower leg: Edema present.     Left lower leg: Edema present.     Comments: 2+ pitting edema BLE  Skin:    General: Skin is warm and dry.     Comments: Anasarca to mid abdomen  Neurological:     Mental Status: He is alert and oriented to person, place, and time.     Comments: Fluent speech  Psychiatric:     Comments: Disheveled, mumbling speech      ED Treatments / Results  Labs (all labs ordered are listed, but only abnormal results are displayed) Labs Reviewed  COMPREHENSIVE METABOLIC PANEL - Abnormal; Notable for the following components:      Result Value   Glucose, Bld 329 (*)    BUN 36 (*)    Calcium 8.6 (*)    Albumin 2.8 (*)    Alkaline Phosphatase 128 (*)    All other components within normal limits  TROPONIN I - Abnormal; Notable for the following  components:   Troponin I 0.04 (*)    All other components within normal limits  BRAIN NATRIURETIC PEPTIDE - Abnormal; Notable for the following components:   B Natriuretic Peptide 877.1 (*)    All other components within normal limits  CBC - Abnormal; Notable for the following components:   RBC 3.93 (*)    Hemoglobin 9.6 (*)    HCT 32.4 (*)    MCH 24.4 (*)    MCHC 29.6 (*)    RDW 19.9 (*)    All other components within normal limits  CBG MONITORING, ED - Abnormal; Notable for the following components:   Glucose-Capillary 339 (*)    All other components within normal limits  CBG MONITORING, ED - Abnormal; Notable for the following components:   Glucose-Capillary 370 (*)    All other components within normal limits    EKG EKG Interpretation  Date/Time:  Tuesday Nov 10 2018 16:43:54 EDT Ventricular Rate:  94 PR Interval:    QRS Duration: 87 QT Interval:  357 QTC Calculation: 447 R Axis:   67 Text Interpretation:  Sinus rhythm Anterior infarct, old Nonspecific T abnormalities, lateral leads No significant change since last tracing Confirmed by Estle Peers [phone]) on 11/10/2018 5:05:04 PM   Radiology Dg Chest 2 View  Result Date: 11/10/2018 CLINICAL DATA:  Lower extremity swelling.  Recent heart failure. EXAM: CHEST - 2 VIEW COMPARISON:  Chest x-ray dated 11/07/2018 and 11/05/2018 FINDINGS: Chronic cardiomegaly. Tiny bilateral pleural effusions. Small amount of fluid along the fissures. No visible pulmonary edema. Slight pulmonary vascular congestion. Aortic atherosclerosis. IMPRESSION: Chronic cardiomegaly. Slight pulmonary vascular congestion, slightly more prominent than on the prior exam. Minimal bilateral pleural effusions. Aortic Atherosclerosis (ICD10-I70.0). Electronically Signed   By: Francene Boyers M.D.   On: 11/10/2018 17:50    Procedures Procedures (including critical care time)  Medications Ordered in ED Medications  nicotine (NICODERM CQ - dosed in mg/24  hours) patch 21  mg (21 mg Transdermal Patch Applied 11/10/18 1659)  furosemide (LASIX) injection 40 mg (40 mg Intravenous Given 11/10/18 1701)  acetaminophen (TYLENOL) tablet 1,000 mg (1,000 mg Oral Given 11/10/18 1659)  insulin aspart (novoLOG) injection 4 Units (4 Units Subcutaneous Given 11/10/18 0400)  insulin glargine (LANTUS) injection 14 Units (14 Units Subcutaneous Given 11/10/18 2014)     Initial Impression / Assessment and Plan / ED Course  I have reviewed the triage vital signs and the nursing notes.  Pertinent labs & imaging results that were available during my care of the patient were reviewed by me and considered in my medical decision making (see chart for details).        PT was stable on arrival, 100% on RA, no respiratory distress.  He had peripheral edema on exam which seems to be the focus of most of his complaints.  Lab work shows hyperglycemia without DKA, troponin is stable at 0.04, where it has been on multiple previous values.  BNP 877 which is improved from previous.  CBC stable.  Chest x-ray similar to previous.  He has no respiratory distress or oxygen requirement here warranting admission for CHF and given that he is 100% on room air with improving BNP, I feel it is reasonable to continue him on his recently increased lasix dose. I did give extra dose of lasix here, along with his home dose of lantus. I discussed w/ SW on call, Christiane Ha, who reviewed chart and advised that patient does not qualify for SNF or group home. He provided pt with resources for local shelters.  I discussed work-up findings and importance of compliance with home medications.  Patient became belligerent, stating that he had nowhere to go. I explained the limitations of the ED for placement/ social assistance. PT ambulated at time of discharge.  Final Clinical Impressions(s) / ED Diagnoses   Final diagnoses:  Peripheral edema  Hyperglycemia  Homelessness  Polysubstance abuse [facility])    ED  Discharge Orders    None       Etta Gassett, Ambrose Finland, MD 11/10/18 2044

## 2018-11-10 NOTE — Progress Notes (Addendum)
Pt is well-known to this Clinical research associate as pt presents to [facility] intermittently requesting group home placement and can become agitated when provided with shelter resources and other community resources only.    Pt is not appropriate for group home placement from the hospital due to documentation of pt leaving AMA, demonstrating agitation and lack of criteria necessary for inpatient psychiatric hospitalization which would be a prerequisite for documented medication management and therapy resulting in pt being viewed by group homes as a likely candidate who would present as cooperative, compliant and able to progress.  In addtion pt has a documented HX of polysubstance abuse with pt's last known use being today (5/13).  Pt is no longer engaged in services by [facility] due to pt, "Per [facility], patient was not engaging in services provided and discharged patient from service. Patient no longer receives services from [place]".  Pt was on the medical floor and provided resources by social work on Sunday 9/10 before D/C'ing AMA, per the chart to leave, to use substances and return via the ED.  CSW will continue to follow for D/C needs.  Dorothe Pea. Mallie Linnemann, LCSW, LCAS, CSI Transitions of Care Clinical Social Worker Care Coordination Department Ph: [phone]

## 2018-11-10 NOTE — ED Notes (Signed)
Gave Insulin at 18:45 with Maddie, RN verifying.

## 2018-11-10 NOTE — ED Triage Notes (Signed)
Per EMS: Pt was admitted on the 9th of May.  Pt left AMA today, then smoked crack and a cigarettes.  Pt called EMS stating "I don' feel well, and got what I want so I want to come back."  Pt soiled himself in feces. Pt given 500cc NS 18 gauge IV in LAC.

## 2018-11-11 ENCOUNTER — Emergency Department (HOSPITAL_COMMUNITY): Payer: Medicaid Other

## 2018-11-11 ENCOUNTER — Encounter (HOSPITAL_COMMUNITY): Payer: Self-pay | Admitting: *Deleted

## 2018-11-11 ENCOUNTER — Emergency Department (HOSPITAL_COMMUNITY)
Admission: EM | Admit: 2018-11-11 | Discharge: 2018-11-11 | Disposition: A | Discharge status: Home / Self Care | Payer: Medicaid Other | Attending: Emergency Medicine | Admitting: Emergency Medicine

## 2018-11-11 ENCOUNTER — Other Ambulatory Visit: Payer: Self-pay

## 2018-11-11 DIAGNOSIS — F1490 Cocaine use, unspecified, uncomplicated: Secondary | ICD-10-CM | POA: Insufficient documentation

## 2018-11-11 DIAGNOSIS — I5042 Chronic combined systolic (congestive) and diastolic (congestive) heart failure: Secondary | ICD-10-CM | POA: Diagnosis not present

## 2018-11-11 DIAGNOSIS — I10 Essential (primary) hypertension: Secondary | ICD-10-CM | POA: Insufficient documentation

## 2018-11-11 DIAGNOSIS — Z7982 Long term (current) use of aspirin: Secondary | ICD-10-CM | POA: Diagnosis not present

## 2018-11-11 DIAGNOSIS — F1290 Cannabis use, unspecified, uncomplicated: Secondary | ICD-10-CM | POA: Diagnosis not present

## 2018-11-11 DIAGNOSIS — Z79899 Other long term (current) drug therapy: Secondary | ICD-10-CM | POA: Diagnosis not present

## 2018-11-11 DIAGNOSIS — Z794 Long term (current) use of insulin: Secondary | ICD-10-CM | POA: Diagnosis not present

## 2018-11-11 DIAGNOSIS — F17210 Nicotine dependence, cigarettes, uncomplicated: Secondary | ICD-10-CM | POA: Diagnosis not present

## 2018-11-11 DIAGNOSIS — F250 Schizoaffective disorder, bipolar type: Secondary | ICD-10-CM | POA: Diagnosis not present

## 2018-11-11 DIAGNOSIS — R609 Edema, unspecified: Secondary | ICD-10-CM | POA: Diagnosis not present

## 2018-11-11 DIAGNOSIS — Z590 Homelessness: Secondary | ICD-10-CM | POA: Insufficient documentation

## 2018-11-11 DIAGNOSIS — E119 Type 2 diabetes mellitus without complications: Secondary | ICD-10-CM | POA: Diagnosis not present

## 2018-11-11 LAB — HERPES SIMPLEX VIRUS(HSV) DNA BY PCR: HSV 2 DNA: NEGATIVE

## 2018-11-11 LAB — HSV DNA BY PCR (REFERENCE LAB): HSV 1 DNA: NEGATIVE

## 2018-11-11 MED ORDER — FUROSEMIDE 20 MG PO TABS
40.0000 mg | ORAL_TABLET | Freq: Once | ORAL | Status: AC
  Administered 2018-11-11: 08:00:00 40 mg via ORAL
  Filled 2018-11-11: qty 2

## 2018-11-11 MED ORDER — FUROSEMIDE 20 MG PO TABS: 60.0000 mg | ORAL_TABLET | Freq: Every day | ORAL | 0 refills | Status: DC

## 2018-11-11 MED ORDER — ACETAMINOPHEN 500 MG PO TABS
1000.0000 mg | ORAL_TABLET | Freq: Once | ORAL | Status: AC
  Administered 2018-11-11: 08:00:00 1000 mg via ORAL
  Filled 2018-11-11: qty 2

## 2018-11-11 MED ORDER — FUROSEMIDE 10 MG/ML IJ SOLN: 40.0000 mg | Freq: Once | INTRAMUSCULAR | Status: DC

## 2018-11-11 NOTE — ED Notes (Signed)
ED Provider at bedside. 

## 2018-11-11 NOTE — ED Triage Notes (Signed)
Pt in c/o continued leg swelling that worsened after walking around outside, EMS was called after [employer] found pt trespassing, seen at [facility] for same yesterday  EMS VS:160/100 HR 96 RR 18 CBG 208 97.7

## 2018-11-11 NOTE — ED Notes (Signed)
Pt given [place] sandwich as he was being d/c'd.

## 2018-11-11 NOTE — ED Notes (Signed)
Security called to assist with patient d/c. Patient verbalizes understanding of discharge instructions. Opportunity for questioning and answers were provided. Armband removed by staff, pt discharged from ED.

## 2018-11-11 NOTE — ED Notes (Signed)
Pt given lotion for dry skin.  

## 2018-11-11 NOTE — Discharge Instructions (Signed)
I have refilled your Lasix.  You can pick it up at the pharmacy.  Follow-up with your primary care physician.  Turn to the emergency department if any concerning signs or symptoms develop.

## 2018-11-11 NOTE — ED Provider Notes (Signed)
[facility] EMERGENCY DEPARTMENT Provider Note   CSN: [id number] Arrival date & time: 11/11/18  4098    History   Chief Complaint Chief Complaint  Patient presents with  . Leg Swelling    HPI [name] is a 58 y.o. male with history of diabetes mellitus, homelessness, hypertension, polysubstance abuse, schizophrenia, CHF with LVEF 25 to 30% presenting with ongoing complaint of peripheral edema.  Notes some continued shortness of breath.  He presents to the ED with these and other complaints frequently, usually exacerbated by his homelessness.  He has had 63 visits in the last 6 months.  He was recently admitted to the hospital on 11/07/2018 and left on 11/09/2018 AMA, then returned to the ED the following day (yesterday) after drinking a beer, smoking a cigarette, and using a small amount of crack.  His work-up yesterday was reassuring that his CHF exacerbation was improving and that he was stable for discharge.  Social work was consulted and the patient became upset when he was informed that he did not qualify for SNF or group home. He returns today covered in feces and urine states that due to his physical limitations that he has a hard time pulling his pants down in order to use the bathroom.  He is requesting a sandwich and Tylenol.    The history is provided by the patient.    Past Medical History:  Diagnosis Date  . Chronic foot pain   . Cocaine abuse (HCC)   . Diabetes mellitus without complication (HCC)   . Hepatitis C    unsure   . Homelessness   . Hypertension   . Neuropathy   . Polysubstance abuse (HCC)   . Schizophrenia (HCC)   . Sleep apnea   . Systolic and diastolic CHF, chronic [facility])     Patient Active Problem List   Diagnosis Date Noted  . Pressure injury of skin 11/08/2018  . Systolic and diastolic CHF, acute on chronic (HCC) 11/07/2018  . Elevated troponin 10/18/2018  . HLD (hyperlipidemia) 10/18/2018  . Anxiety 10/18/2018  .  CHF (congestive heart failure), NYHA class II, acute on chronic, combined (HCC) 10/18/2018  . Hypertensive urgency 10/12/2018  . Mild renal insufficiency 10/12/2018  . Cellulitis 10/12/2018  . Penile cellulitis   . Adjustment disorder with mixed disturbance of emotions and conduct   . Sleep apnea 10/03/2018  . Hypokalemia 09/17/2018  . Scrotal swelling 09/17/2018  . Urinary hesitancy   . Constipation   . Acute on chronic combined systolic and diastolic CHF (congestive heart failure) (HCC) 08/25/2018  . Acute respiratory failure with hypoxia (HCC) 08/25/2018  . Acute on chronic combined systolic and diastolic congestive heart failure (HCC) 08/18/2018  . Tobacco use 08/18/2018  . 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 Homelessness 08/08/2018  . Smoker 08/08/2018  . Acute exacerbation of CHF (congestive heart failure) (HCC) 03/28/2018  . Prostate enlargement 03/16/2018  . Aortic atherosclerosis (HCC) 03/16/2018  . 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 Chronic foot pain   . Schizoaffective disorder, bipolar type (HCC) 09/30/2016  . Substance induced mood disorder (HCC) 03/13/2015  . Acute kidney failure (HCC) 01/26/2015  . Schizophrenia, paranoid type (HCC) 01/17/2015  . Drug hallucinosis (HCC) 10/08/2014  . Chronic paranoid schizophrenia (HCC) 09/07/2014  . 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 Urinary retention   . Cocaine use disorder, severe, dependence (HCC)   .  Essential hypertension, benign 03/28/2013  . 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 MULTIPLE TOOTH EXTRACTIONS          Home Medications    Prior to Admission medications   Medication Sig Start Date End Date Taking? Authorizing Provider  ARIPiprazole (ABILIFY) 10 MG  tablet Take 1 tablet (10 mg total) by mouth daily. 09/18/18   Meredeth Ide, MD  aspirin EC 81 MG tablet Take 1 tablet (81 mg total) by mouth daily. 10/19/18 02/16/19  Meredeth Ide, MD  atorvastatin (LIPITOR) 40 MG tablet Take 1 tablet (40 mg total) by mouth daily. 09/18/18   Meredeth Ide, MD  bisacodyl (DULCOLAX) 5 MG EC tablet Take 1 tablet (5 mg total) by mouth daily as needed for moderate constipation. 09/04/18   Rodolph Bong, MD  carbamazepine (TEGRETOL) 200 MG tablet Take 0.5 tablets (100 mg total) by mouth 2 (two) times daily. 09/18/18   Meredeth Ide, MD  carvedilol (COREG) 6.25 MG tablet Take 1 tablet (6.25 mg total) by mouth 2 (two) times daily with a meal. 10/19/18   Sharl Ma, Sarina Ill, MD  cephALEXin (KEFLEX) 500 MG capsule Take 1 capsule (500 mg total) by mouth 4 (four) times daily. 10/22/18   Roxy Horseman, PA-C  furosemide (LASIX) 20 MG tablet Take 3 tablets (60 mg total) by mouth daily for 30 days. 11/11/18 12/11/18  Michela Pitcher A, PA-C  hydrocortisone (PROCTO-PAK) 1 % CREA Apply twice a day to hemorrhoids. 09/29/18   Renne Crigler, PA-C  Insulin Glargine (LANTUS) 100 UNIT/ML Solostar Pen Inject 14 Units into the skin daily. 09/18/18   Meredeth Ide, MD  Insulin Pen Needle 29G X MISC Use with insulin 09/18/18   Meredeth Ide, MD  lisinopril (PRINIVIL,ZESTRIL) 10 MG tablet Take 1 tablet (10 mg total) by mouth daily. 09/18/18   Meredeth Ide, MD  nicotine (NICODERM CQ - DOSED IN MG/24 HOURS) 21 mg/24hr patch Place 1 patch (21 mg total) onto the skin daily. 09/04/18   Rodolph Bong, MD  polyethylene glycol [facility] Ethelene Hal) packet Take 17 g by mouth 2 (two) times daily. 09/18/18   Meredeth Ide, MD  Potassium Chloride ER 20 MEQ TBCR Take 20 mEq by mouth daily. 09/18/18   Meredeth Ide, MD  senna-docusate (SENOKOT-S) 8.6-50 MG tablet Take 1 tablet by mouth 2 (two) times daily. 09/04/18   Rodolph Bong, MD  tamsulosin [facility]) 0.4 MG CAPS capsule Take 1 capsule (0.4 mg total) by mouth  daily after breakfast. 10/05/18   Narda Bonds, MD    Family History Family History  Problem Relation Age of Onset  . Hypertension Other   . Diabetes Other     Social History Social History   Tobacco Use  . Smoking status: Current Every Day Smoker    Packs/day: 1.00    Years: 20.00    Pack years: 20.00    Types: Cigarettes  . Smokeless tobacco: Current User  Substance Use Topics  . Alcohol use: Yes    Comment: Daily Drinker   . Drug use: Yes    Frequency: 7.0 times per week    Types: "Crack" cocaine, Cocaine, Marijuana     Allergies   Haldol [haloperidol]   Review of Systems Review of Systems  Constitutional: Negative for chills and fever.  Respiratory: Positive for shortness of breath. Negative for cough.   Cardiovascular: Positive  for leg swelling. Negative for chest pain.  Gastrointestinal: Positive for abdominal distention. Negative for nausea and vomiting.  All other systems reviewed and are negative.    Physical Exam Updated Vital Signs BP (!) 153/101   Pulse 98   Temp 98.3 F (36.8 C) (Oral)   Resp 20   Ht 5\' 9"  (1.753 m)   Wt 94.3 kg   SpO2 98%   BMI 30.72 kg/m   Physical Exam Vitals signs and nursing note reviewed.  Constitutional:      General: He is not in acute distress.    Appearance: He is well-developed.     Comments: Resting comfortably in bed, chronically ill in appearance.  Feces and urine soaked clothing  HENT:     Head: Normocephalic and atraumatic.  Eyes:     General:        Right eye: No discharge.        Left eye: No discharge.     Conjunctiva/sclera: Conjunctivae normal.  Neck:     Vascular: No JVD.     Trachea: No tracheal deviation.  Cardiovascular:     Rate and Rhythm: Normal rate and regular rhythm.     Pulses: Normal pulses.  Pulmonary:     Effort: Pulmonary effort is normal.     Comments: Speaking in full sentences without difficulty.  SPO2 saturations 97% on room air.  Scattered rales and rhonchi. Abdominal:      General: Abdomen is protuberant.     Tenderness: There is no abdominal tenderness. There is no guarding.     Comments: Abdominal anasarca noted  Musculoskeletal:     Right lower leg: Edema present.     Left lower leg: Edema present.     Comments: 1+ pitting edema of the bilateral lower extremities, Homans sign absent bilaterally.  Skin:    General: Skin is warm and dry.     Findings: No erythema.  Neurological:     Mental Status: He is alert.  Psychiatric:        Behavior: Behavior normal.      ED Treatments / Results  Labs (all labs ordered are listed, but only abnormal results are displayed) Labs Reviewed - No data to display  EKG None  Radiology Dg Chest 2 View  Result Date: 11/10/2018 CLINICAL DATA:  Lower extremity swelling.  Recent heart failure. EXAM: CHEST - 2 VIEW COMPARISON:  Chest x-ray dated 11/07/2018 and 11/05/2018 FINDINGS: Chronic cardiomegaly. Tiny bilateral pleural effusions. Small amount of fluid along the fissures. No visible pulmonary edema. Slight pulmonary vascular congestion. Aortic atherosclerosis. IMPRESSION: Chronic cardiomegaly. Slight pulmonary vascular congestion, slightly more prominent than on the prior exam. Minimal bilateral pleural effusions. Aortic Atherosclerosis (ICD10-I70.0). Electronically Signed   By: Francene BoyersJames  Maxwell M.D.   On: 11/10/2018 17:50   Dg Chest Portable 1 View  Result Date: 11/11/2018 CLINICAL DATA:  [age] year old male with shortness of breath. Abdominal pain, lower extremity swelling. EXAM: PORTABLE CHEST 1 VIEW COMPARISON:  11/10/2018 and earlier. FINDINGS: Portable AP upright view at 0827 hours. Stable cardiomegaly and mediastinal contours. Visualized tracheal air column is within normal limits. Stable lung volumes. Mild linear atelectasis is stable in the mid left lung. Increased interstitial opacity with indistinct vasculature. Persistent small right pleural effusion. No pneumothorax or consolidation. Stable small retained  metallic foreign body projecting at the anterior right hemidiaphragm. IMPRESSION: Acute pulmonary interstitial edema suspected. Stable cardiomegaly and small right pleural effusion. Electronically Signed   By: Odessa FlemingH  Hall M.D.   On: 11/11/2018  08:36    Procedures Procedures (including critical care time)  Medications Ordered in ED Medications  acetaminophen (TYLENOL) tablet 1,000 mg (1,000 mg Oral Given 11/11/18 0824)  furosemide (LASIX) tablet 40 mg (40 mg Oral Given 11/11/18 5809)     Initial Impression / Assessment and Plan / ED Course  I have reviewed the triage vital signs and the nursing notes.  Pertinent labs & imaging results that were available during my care of the patient were reviewed by me and considered in my medical decision making (see chart for details).        Patient well-known to this ED returns today for evaluation of ongoing leg swelling and abdominal anasarca. Seen at [facility] ED yesterday with improving BNP, troponin at baseline, stable SPO2 saturations.  Chest Xray with persistent interstitial edema and small right pleural effusion. He was given home dose of lasix in the ED with good urine output. While in the ED, he defecated on himself without alerting his nurse that he had the urge to use the restroom and so was assistant to shower where he cleaned himself up. He was then ambulated on pulse oximetry which he did without difficulty and with stable SPO2 saturations. Given reassuring workup yesterday and stable vitals today, he does not meet inpatient criteria for admission. Low suspicion of acute process. I will refill his Lasix. He is requesting placement to a group home. Per social work note from yesterday:   "Pt is not appropriate for group home placement from the hospital due to documentation of pt leaving AMA, demonstrating agitation and lack of criteria necessary for inpatient psychiatric hospitalization which would be a prerequisite for documented medication  management and therapy resulting in pt being viewed by group homes as a likely candidate who would present as cooperative, compliant and able to progress.  In addtion pt has a documented HX of polysubstance abuse with pt's last known use being today (5/13).  Pt is no longer engaged in services by [facility] due to pt, "Per [facility], patient was not engaging in services provided and discharged patient from service. Patient no longer receives services from [facility]"."  I informed patient of our abilities for placement in the ED and he became irate and agitated. Required security to escort him off the premises. Discussed with Dr. Rubin Payor who agrees with assessment and plan at this time.  Final Clinical Impressions(s) / ED Diagnoses   Final diagnoses:  Peripheral edema    ED Discharge Orders         Ordered    furosemide (LASIX) 20 MG tablet  Daily     11/11/18 0946           Jeanie Sewer, PA-C 11/11/18 1009    Benjiman Core, MD 11/11/18 1507

## 2018-11-11 NOTE — ED Notes (Signed)
This RN and NT assisted pt to shower with bedside commode in place.

## 2018-11-11 NOTE — ED Notes (Signed)
NT ambulated with patient from shower to room, with O2 sat 92-95%.

## 2018-11-12 ENCOUNTER — Encounter (HOSPITAL_COMMUNITY): Payer: Self-pay | Admitting: Emergency Medicine

## 2018-11-12 ENCOUNTER — Other Ambulatory Visit: Payer: Self-pay

## 2018-11-12 ENCOUNTER — Emergency Department (HOSPITAL_COMMUNITY)
Admission: EM | Admit: 2018-11-12 | Discharge: 2018-11-12 | Payer: Medicaid Other | Attending: Emergency Medicine | Admitting: Emergency Medicine

## 2018-11-12 DIAGNOSIS — Z794 Long term (current) use of insulin: Secondary | ICD-10-CM | POA: Insufficient documentation

## 2018-11-12 DIAGNOSIS — R609 Edema, unspecified: Secondary | ICD-10-CM

## 2018-11-12 DIAGNOSIS — Z79899 Other long term (current) drug therapy: Secondary | ICD-10-CM | POA: Insufficient documentation

## 2018-11-12 DIAGNOSIS — F17220 Nicotine dependence, chewing tobacco, uncomplicated: Secondary | ICD-10-CM | POA: Insufficient documentation

## 2018-11-12 DIAGNOSIS — I110 Hypertensive heart disease with heart failure: Secondary | ICD-10-CM | POA: Diagnosis not present

## 2018-11-12 DIAGNOSIS — E119 Type 2 diabetes mellitus without complications: Secondary | ICD-10-CM | POA: Diagnosis not present

## 2018-11-12 DIAGNOSIS — I5042 Chronic combined systolic (congestive) and diastolic (congestive) heart failure: Secondary | ICD-10-CM | POA: Diagnosis not present

## 2018-11-12 DIAGNOSIS — F17210 Nicotine dependence, cigarettes, uncomplicated: Secondary | ICD-10-CM | POA: Insufficient documentation

## 2018-11-12 NOTE — ED Provider Notes (Addendum)
[facility]-EMERGENCY DEPT Provider Note   CSN: [id number] Arrival date & time: 11/12/18  2053    History   Chief Complaint Chief Complaint  Patient presents with  . Abdominal Pain  . fluid in legs and feet    HPI [name] is a 58 y.o. male.     HPI  58 year old male presents needing medical clearance for jail.  Per the officers and patient, the patient was arrested for trespassing and the jail nurse was concerned given his peripheral edema.  The patient states that everything he has right now is chronic and he has no new complaints.  He denies any dyspnea.  All of his meds are at the jail.  He states that usually when he urinates well he gets the fluid off.  He has some abdominal distention from fluid and some discomfort from this but nothing new.  Past Medical History:  Diagnosis Date  . Chronic foot pain   . Cocaine abuse (HCC)   . Diabetes mellitus without complication (HCC)   . Hepatitis C    unsure   . Homelessness   . Hypertension   . Neuropathy   . Polysubstance abuse (HCC)   . Schizophrenia (HCC)   . Sleep apnea   . Systolic and diastolic CHF, chronic [facility])     Patient Active Problem List   Diagnosis Date Noted  . Pressure injury of skin 11/08/2018  . Systolic and diastolic CHF, acute on chronic (HCC) 11/07/2018  . Elevated troponin 10/18/2018  . HLD (hyperlipidemia) 10/18/2018  . Anxiety 10/18/2018  . CHF (congestive heart failure), NYHA class II, acute on chronic, combined (HCC) 10/18/2018  . Hypertensive urgency 10/12/2018  . Mild renal insufficiency 10/12/2018  . Cellulitis 10/12/2018  . Penile cellulitis   . Adjustment disorder with mixed disturbance of emotions and conduct   . Sleep apnea 10/03/2018  . Hypokalemia 09/17/2018  . Scrotal swelling 09/17/2018  . Urinary hesitancy   . Constipation   . Acute on chronic combined systolic and diastolic CHF (congestive heart failure) (HCC) 08/25/2018  . Acute respiratory  failure with hypoxia (HCC) 08/25/2018  . Acute on chronic combined systolic and diastolic congestive heart failure (HCC) 08/18/2018  . Tobacco use 08/18/2018  . 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 Homelessness 08/08/2018  . Smoker 08/08/2018  . Acute exacerbation of CHF (congestive heart failure) (HCC) 03/28/2018  . Prostate enlargement 03/16/2018  . Aortic atherosclerosis (HCC) 03/16/2018  . 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 Chronic foot pain   . Schizoaffective disorder, bipolar type (HCC) 09/30/2016  . Substance induced mood disorder (HCC) 03/13/2015  . Acute kidney failure (HCC) 01/26/2015  . Schizophrenia, paranoid type (HCC) 01/17/2015  . Drug hallucinosis (HCC) 10/08/2014  . Chronic paranoid schizophrenia (HCC) 09/07/2014  . 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 Urinary retention   . Cocaine use disorder, severe, dependence (HCC)   . Essential hypertension, benign 03/28/2013  . 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 MULTIPLE TOOTH EXTRACTIONS          Home Medications    Prior to Admission medications   Medication Sig Start Date End Date Taking? Authorizing Provider  ARIPiprazole (ABILIFY) 10 MG tablet Take 1 tablet (10 mg total) by mouth daily. 09/18/18   Meredeth Ide, MD  aspirin  EC 81 MG tablet Take 1 tablet (81 mg total) by mouth daily. 10/19/18 02/16/19  Meredeth Ide, MD  atorvastatin (LIPITOR) 40 MG tablet Take 1 tablet (40 mg total) by mouth daily. 09/18/18   Meredeth Ide, MD  bisacodyl (DULCOLAX) 5 MG EC tablet Take 1 tablet (5 mg total) by mouth daily as needed for moderate constipation. 09/04/18   Rodolph Bong, MD  carbamazepine (TEGRETOL) 200 MG tablet Take 0.5 tablets (100 mg total) by mouth 2 (two) times  daily. 09/18/18   Meredeth Ide, MD  carvedilol (COREG) 6.25 MG tablet Take 1 tablet (6.25 mg total) by mouth 2 (two) times daily with a meal. 10/19/18   Sharl Ma, Sarina Ill, MD  cephALEXin (KEFLEX) 500 MG capsule Take 1 capsule (500 mg total) by mouth 4 (four) times daily. 10/22/18   Roxy Horseman, PA-C  furosemide (LASIX) 20 MG tablet Take 3 tablets (60 mg total) by mouth daily for 30 days. 11/11/18 12/11/18  Michela Pitcher A, PA-C  hydrocortisone (PROCTO-PAK) 1 % CREA Apply twice a day to hemorrhoids. 09/29/18   Renne Crigler, PA-C  Insulin Glargine (LANTUS) 100 UNIT/ML Solostar Pen Inject 14 Units into the skin daily. 09/18/18   Meredeth Ide, MD  Insulin Pen Needle 29G X MISC Use with insulin 09/18/18   Meredeth Ide, MD  lisinopril (PRINIVIL,ZESTRIL) 10 MG tablet Take 1 tablet (10 mg total) by mouth daily. 09/18/18   Meredeth Ide, MD  nicotine (NICODERM CQ - DOSED IN MG/24 HOURS) 21 mg/24hr patch Place 1 patch (21 mg total) onto the skin daily. 09/04/18   Rodolph Bong, MD  polyethylene glycol [facility] Ethelene Hal) packet Take 17 g by mouth 2 (two) times daily. 09/18/18   Meredeth Ide, MD  Potassium Chloride ER 20 MEQ TBCR Take 20 mEq by mouth daily. 09/18/18   Meredeth Ide, MD  senna-docusate (SENOKOT-S) 8.6-50 MG tablet Take 1 tablet by mouth 2 (two) times daily. 09/04/18   Rodolph Bong, MD  tamsulosin [facility]) 0.4 MG CAPS capsule Take 1 capsule (0.4 mg total) by mouth daily after breakfast. 10/05/18   Narda Bonds, MD    Family History Family History  Problem Relation Age of Onset  . Hypertension Other   . Diabetes Other     Social History Social History   Tobacco Use  . Smoking status: Current Every Day Smoker    Packs/day: 1.00    Years: 20.00    Pack years: 20.00    Types: Cigarettes  . Smokeless tobacco: Current User  Substance Use Topics  . Alcohol use: Yes    Comment: Daily Drinker   . Drug use: Yes    Frequency: 7.0 times per week    Types: "Crack" cocaine, Cocaine,  Marijuana     Allergies   Haldol [haloperidol]   Review of Systems Review of Systems  Constitutional: Negative for fever.  Respiratory: Negative for shortness of breath.   Cardiovascular: Positive for leg swelling.  Gastrointestinal: Positive for abdominal distention.  Genitourinary: Positive for scrotal swelling.  All other systems reviewed and are negative.    Physical Exam Updated Vital Signs BP (!) 144/93 (BP Location: Left Arm)   Pulse 99   Temp 98.7 F (37.1 C) (Oral)   Resp 18   SpO2 99%   Physical Exam Vitals signs and nursing note reviewed.  Constitutional:      General: He is not in acute distress.    Appearance: He is  well-developed. He is not ill-appearing or diaphoretic.     Comments: Eating crackers and drinking milk  HENT:     Head: Normocephalic and atraumatic.     Right Ear: External ear normal.     Left Ear: External ear normal.     Nose: Nose normal.  Eyes:     General:        Right eye: No discharge.        Left eye: No discharge.  Neck:     Musculoskeletal: Neck supple.  Cardiovascular:     Rate and Rhythm: Normal rate and regular rhythm.     Heart sounds: Normal heart sounds.  Pulmonary:     Effort: Pulmonary effort is normal. No tachypnea or accessory muscle usage.     Breath sounds: Examination of the right-lower field reveals rales. Rales (mild) present.  Abdominal:     Palpations: Abdomen is soft.     Tenderness: There is no abdominal tenderness.     Comments: anarsarca  Genitourinary:    Comments: Scrotal and penile swelling Musculoskeletal:     Right lower leg: Edema present.     Left lower leg: Edema present.  Skin:    General: Skin is warm and dry.  Neurological:     Mental Status: He is alert.  Psychiatric:        Mood and Affect: Mood is not anxious.      ED Treatments / Results  Labs (all labs ordered are listed, but only abnormal results are displayed) Labs Reviewed - No data to display  EKG None   Radiology Dg Chest Portable 1 View  Result Date: 11/11/2018 CLINICAL DATA:  58 year old male with shortness of breath. Abdominal pain, lower extremity swelling. EXAM: PORTABLE CHEST 1 VIEW COMPARISON:  11/10/2018 and earlier. FINDINGS: Portable AP upright view at 0827 hours. Stable cardiomegaly and mediastinal contours. Visualized tracheal air column is within normal limits. Stable lung volumes. Mild linear atelectasis is stable in the mid left lung. Increased interstitial opacity with indistinct vasculature. Persistent small right pleural effusion. No pneumothorax or consolidation. Stable small retained metallic foreign body projecting at the anterior right hemidiaphragm. IMPRESSION: Acute pulmonary interstitial edema suspected. Stable cardiomegaly and small right pleural effusion. Electronically Signed   By: Odessa Fleming M.D.   On: 11/11/2018 08:36    Procedures Procedures (including critical care time)  Medications Ordered in ED Medications - No data to display   Initial Impression / Assessment and Plan / ED Course  I have reviewed the triage vital signs and the nursing notes.  Pertinent labs & imaging results that were available during my care of the patient were reviewed by me and considered in my medical decision making (see chart for details).        Patient's peripheral edema is baseline.  This includes the scrotal swelling.  He is still able to urinate.  The patient endorses no dyspnea and with O2 sats in the mid to high 90s I have low suspicion for acute pulmonary edema that would need IV diuresis.  He declines Lasix here and states he has it at jail.  He would like to be discharged with I think is reasonable given his chronicity.  He does have some very mild rales in the right lower lobe to correlate with the pleural effusion seen yesterday.  Frequently has labs drawn (is here very often) and so I don't think they're indicated at this time. Otherwise, I think he is stable for discharge  to jail.  Final Clinical Impressions(s) / ED Diagnoses   Final diagnoses:  Peripheral edema    ED Discharge Orders    None       Pricilla LovelessGoldston, Hezzie Karim, MD 11/12/18 2147    Pricilla LovelessGoldston, Kortney Potvin, MD 11/12/18 2148

## 2018-11-12 NOTE — ED Triage Notes (Signed)
Pt sent to ED for possible congestive failure  As evidence by bilat LE edema

## 2018-11-13 ENCOUNTER — Emergency Department (HOSPITAL_COMMUNITY)
Admission: EM | Admit: 2018-11-13 | Discharge: 2018-11-13 | Disposition: A | Discharge status: Home / Self Care | Payer: Medicaid Other | Attending: Emergency Medicine | Admitting: Emergency Medicine

## 2018-11-13 ENCOUNTER — Emergency Department (HOSPITAL_COMMUNITY): Payer: Medicaid Other

## 2018-11-13 ENCOUNTER — Other Ambulatory Visit: Payer: Self-pay

## 2018-11-13 ENCOUNTER — Encounter (HOSPITAL_COMMUNITY): Payer: Self-pay | Admitting: Emergency Medicine

## 2018-11-13 DIAGNOSIS — R1900 Intra-abdominal and pelvic swelling, mass and lump, unspecified site: Secondary | ICD-10-CM | POA: Diagnosis not present

## 2018-11-13 DIAGNOSIS — H5789 Other specified disorders of eye and adnexa: Secondary | ICD-10-CM | POA: Diagnosis not present

## 2018-11-13 DIAGNOSIS — R0602 Shortness of breath: Secondary | ICD-10-CM | POA: Insufficient documentation

## 2018-11-13 DIAGNOSIS — E119 Type 2 diabetes mellitus without complications: Secondary | ICD-10-CM | POA: Diagnosis not present

## 2018-11-13 DIAGNOSIS — Z79899 Other long term (current) drug therapy: Secondary | ICD-10-CM | POA: Insufficient documentation

## 2018-11-13 DIAGNOSIS — I110 Hypertensive heart disease with heart failure: Secondary | ICD-10-CM | POA: Diagnosis not present

## 2018-11-13 DIAGNOSIS — F17210 Nicotine dependence, cigarettes, uncomplicated: Secondary | ICD-10-CM | POA: Diagnosis not present

## 2018-11-13 DIAGNOSIS — I5042 Chronic combined systolic (congestive) and diastolic (congestive) heart failure: Secondary | ICD-10-CM | POA: Diagnosis not present

## 2018-11-13 DIAGNOSIS — R609 Edema, unspecified: Secondary | ICD-10-CM | POA: Diagnosis not present

## 2018-11-13 LAB — CBC WITH DIFFERENTIAL/PLATELET
Abs Immature Granulocytes: 0.02 10*3/uL (ref 0.00–0.07)
Basophils Absolute: 0 10*3/uL (ref 0.0–0.1)
Basophils Relative: 1 %
Eosinophils Absolute: 0.2 10*3/uL (ref 0.0–0.5)
Eosinophils Relative: 4 %
HCT: 32 % — ABNORMAL LOW (ref 39.0–52.0)
Hemoglobin: 9.4 g/dL — ABNORMAL LOW (ref 13.0–17.0)
Immature Granulocytes: 0 %
Lymphocytes Relative: 15 %
Lymphs Abs: 0.9 10*3/uL (ref 0.7–4.0)
MCH: 24.5 pg — ABNORMAL LOW (ref 26.0–34.0)
MCHC: 29.4 g/dL — ABNORMAL LOW (ref 30.0–36.0)
MCV: 83.3 fL (ref 80.0–100.0)
Monocytes Absolute: 0.6 10*3/uL (ref 0.1–1.0)
Monocytes Relative: 10 %
Neutro Abs: 4.1 10*3/uL (ref 1.7–7.7)
Neutrophils Relative %: 70 %
Platelets: 217 10*3/uL (ref 150–400)
RBC: 3.84 MIL/uL — ABNORMAL LOW (ref 4.22–5.81)
RDW: 19.9 % — ABNORMAL HIGH (ref 11.5–15.5)
WBC: 5.9 10*3/uL (ref 4.0–10.5)
nRBC: 0 % (ref 0.0–0.2)

## 2018-11-13 LAB — BASIC METABOLIC PANEL
Anion gap: 8 (ref 5–15)
BUN: 23 mg/dL — ABNORMAL HIGH (ref 6–20)
CO2: 30 mmol/L (ref 22–32)
Calcium: 8.7 mg/dL — ABNORMAL LOW (ref 8.9–10.3)
Chloride: 103 mmol/L (ref 98–111)
Creatinine, Ser: 0.9 mg/dL (ref 0.61–1.24)
GFR calc Af Amer: >60 mL/min (ref >60)
GFR calc non Af Amer: >60 mL/min (ref >60)
Glucose, Bld: 126 mg/dL — ABNORMAL HIGH (ref 70–99)
Potassium: 3.5 mmol/L (ref 3.5–5.1)
Sodium: 141 mmol/L (ref 135–145)

## 2018-11-13 LAB — POCT I-STAT EG7
Acid-Base Excess: 6 mmol/L — ABNORMAL HIGH (ref 0.0–2.0)
Bicarbonate: 32 mmol/L — ABNORMAL HIGH (ref 20.0–28.0)
Calcium, Ion: 1.21 mmol/L (ref 1.15–1.40)
HCT: 33 % — ABNORMAL LOW (ref 39.0–52.0)
Hemoglobin: 11.2 g/dL — ABNORMAL LOW (ref 13.0–17.0)
O2 Saturation: 85 %
Potassium: 3.6 mmol/L (ref 3.5–5.1)
Sodium: 142 mmol/L (ref 135–145)
TCO2: 34 mmol/L — ABNORMAL HIGH (ref 22–32)
pCO2, Ven: 52.3 mmHg (ref 44.0–60.0)
pH, Ven: 7.395 (ref 7.250–7.430)
pO2, Ven: 51 mmHg — ABNORMAL HIGH (ref 32.0–45.0)

## 2018-11-13 LAB — TROPONIN I: Troponin I: 0.04 ng/mL (ref <0.03)

## 2018-11-13 LAB — BRAIN NATRIURETIC PEPTIDE: B Natriuretic Peptide: 1076.2 pg/mL — ABNORMAL HIGH (ref 0.0–100.0)

## 2018-11-13 MED ORDER — POTASSIUM CHLORIDE CRYS ER 20 MEQ PO TBCR
40.0000 meq | EXTENDED_RELEASE_TABLET | Freq: Once | ORAL | Status: AC
  Administered 2018-11-13: 40 meq via ORAL
  Filled 2018-11-13: qty 2

## 2018-11-13 MED ORDER — FUROSEMIDE 10 MG/ML IJ SOLN
40.0000 mg | Freq: Once | INTRAMUSCULAR | Status: AC
  Administered 2018-11-13: 11:00:00 40 mg via INTRAVENOUS
  Filled 2018-11-13: qty 4

## 2018-11-13 MED ORDER — MAGNESIUM OXIDE 400 (241.3 MG) MG PO TABS
800.0000 mg | ORAL_TABLET | Freq: Once | ORAL | Status: AC
  Administered 2018-11-13: 800 mg via ORAL
  Filled 2018-11-13 (×2): qty 2

## 2018-11-13 MED ORDER — LORATADINE 10 MG PO TABS
10.0000 mg | ORAL_TABLET | Freq: Once | ORAL | Status: AC
  Administered 2018-11-13: 10 mg via ORAL
  Filled 2018-11-13: qty 1

## 2018-11-13 NOTE — ED Provider Notes (Addendum)
[facility]-EMERGENCY DEPT Provider Note   CSN: [id number] Arrival date & time: 11/13/18  1002    History   Chief Complaint Chief Complaint  Patient presents with  . Shortness of Breath  . Fluid Retention    HPI [name] is a 58 y.o. male.     58 yo M with a chief complaint of shortness of breath.  Patient states that his chronic edema has gotten worse over the past day.  He feels that the swelling in his legs and his abdomen is minutes that he is having trouble breathing.  Describes weight on his chest due to the fluid.  States that normally he weighs 160 pounds but thinks that he is much heavier now.  He also feels that he has some swelling in his face and that his eyes are red and itchy and that he needs a prescription medicine for allergies.  He has been taking eyedrops which he does not think is improved his symptoms.  The history is provided by the patient and the police.  Shortness of Breath  Severity:  Moderate Onset quality:  Sudden Duration:  2 days Timing:  Constant Progression:  Unchanged Chronicity:  New Relieved by:  Nothing Worsened by:  Nothing Ineffective treatments:  None tried Associated symptoms: no abdominal pain, no chest pain, no fever, no headaches, no rash and no vomiting     Past Medical History:  Diagnosis Date  . Chronic foot pain   . Cocaine abuse (HCC)   . Diabetes mellitus without complication (HCC)   . Hepatitis C    unsure   . Homelessness   . Hypertension   . Neuropathy   . Polysubstance abuse (HCC)   . Schizophrenia (HCC)   . Sleep apnea   . Systolic and diastolic CHF, chronic [facility])     Patient Active Problem List   Diagnosis Date Noted  . Pressure injury of skin 11/08/2018  . Systolic and diastolic CHF, acute on chronic (HCC) 11/07/2018  . Elevated troponin 10/18/2018  . HLD (hyperlipidemia) 10/18/2018  . Anxiety 10/18/2018  . CHF (congestive heart failure), NYHA class II, acute on chronic,  combined (HCC) 10/18/2018  . Hypertensive urgency 10/12/2018  . Mild renal insufficiency 10/12/2018  . Cellulitis 10/12/2018  . Penile cellulitis   . Adjustment disorder with mixed disturbance of emotions and conduct   . Sleep apnea 10/03/2018  . Hypokalemia 09/17/2018  . Scrotal swelling 09/17/2018  . Urinary hesitancy   . Constipation   . Acute on chronic combined systolic and diastolic CHF (congestive heart failure) (HCC) 08/25/2018  . Acute respiratory failure with hypoxia (HCC) 08/25/2018  . Acute on chronic combined systolic and diastolic congestive heart failure (HCC) 08/18/2018  . Tobacco use 08/18/2018  . 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 Homelessness 08/08/2018  . Smoker 08/08/2018  . Acute exacerbation of CHF (congestive heart failure) (HCC) 03/28/2018  . Prostate enlargement 03/16/2018  . Aortic atherosclerosis (HCC) 03/16/2018  . 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 Chronic foot pain   . Schizoaffective disorder, bipolar type (HCC) 09/30/2016  . Substance induced mood disorder (HCC) 03/13/2015  . Acute kidney failure (HCC) 01/26/2015  . Schizophrenia, paranoid type (HCC) 01/17/2015  . Drug hallucinosis (HCC) 10/08/2014  . Chronic paranoid schizophrenia (HCC) 09/07/2014  . 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 Urinary retention   . Cocaine use disorder, severe,  dependence (HCC)   . Essential hypertension, benign 03/28/2013  . 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 MULTIPLE TOOTH EXTRACTIONS          Home Medications    Prior to Admission medications   Medication Sig Start Date End Date Taking? Authorizing Provider  Insulin Pen Needle 29G X 8MM MISC Use with insulin 09/18/18  Yes Lama, Sarina IllGagan S, MD  ARIPiprazole  (ABILIFY) 10 MG tablet Take 1 tablet (10 mg total) by mouth daily. Patient not taking: Reported on 11/13/2018 09/18/18   Meredeth IdeLama, Gagan S, MD  aspirin EC 81 MG tablet Take 1 tablet (81 mg total) by mouth daily. Patient not taking: Reported on 11/13/2018 10/19/18 02/16/19  Meredeth IdeLama, Gagan S, MD  atorvastatin (LIPITOR) 40 MG tablet Take 1 tablet (40 mg total) by mouth daily. Patient not taking: Reported on 11/13/2018 09/18/18   Meredeth IdeLama, Gagan S, MD  bisacodyl (DULCOLAX) 5 MG EC tablet Take 1 tablet (5 mg total) by mouth daily as needed for moderate constipation. Patient not taking: Reported on 11/13/2018 09/04/18   Rodolph Bonghompson, Daniel V, MD  carbamazepine (TEGRETOL) 200 MG tablet Take 0.5 tablets (100 mg total) by mouth 2 (two) times daily. Patient not taking: Reported on 11/13/2018 09/18/18   Meredeth IdeLama, Gagan S, MD  carvedilol (COREG) 6.25 MG tablet Take 1 tablet (6.25 mg total) by mouth 2 (two) times daily with a meal. Patient not taking: Reported on 11/13/2018 10/19/18   Meredeth IdeLama, Gagan S, MD  cephALEXin (KEFLEX) 500 MG capsule Take 1 capsule (500 mg total) by mouth 4 (four) times daily. Patient not taking: Reported on 11/13/2018 10/22/18   Roxy HorsemanBrowning, Robert, PA-C  furosemide (LASIX) 20 MG tablet Take 3 tablets (60 mg total) by mouth daily for 30 days. Patient not taking: Reported on 11/13/2018 11/11/18 12/11/18  Michela PitcherFawze, Mina A, PA-C  hydrocortisone (PROCTO-PAK) 1 % CREA Apply twice a day to hemorrhoids. Patient not taking: Reported on 11/13/2018 09/29/18   Renne CriglerGeiple, Joshua, PA-C  Insulin Glargine (LANTUS) 100 UNIT/ML Solostar Pen Inject 14 Units into the skin daily. Patient not taking: Reported on 11/13/2018 09/18/18   Meredeth IdeLama, Gagan S, MD  lisinopril (PRINIVIL,ZESTRIL) 10 MG tablet Take 1 tablet (10 mg total) by mouth daily. Patient not taking: Reported on 11/13/2018 09/18/18   Meredeth IdeLama, Gagan S, MD  nicotine (NICODERM CQ - DOSED IN MG/24 HOURS) 21 mg/24hr patch Place 1 patch (21 mg total) onto the skin daily. Patient not taking: Reported on  11/13/2018 09/04/18   Rodolph Bonghompson, Daniel V, MD  polyethylene glycol [facility](MIRALAX / Ethelene HalGLYCOLAX) packet Take 17 g by mouth 2 (two) times daily. Patient not taking: Reported on 11/13/2018 09/18/18   Meredeth IdeLama, Gagan S, MD  Potassium Chloride ER 20 MEQ TBCR Take 20 mEq by mouth daily. Patient not taking: Reported on 11/13/2018 09/18/18   Meredeth IdeLama, Gagan S, MD  senna-docusate (SENOKOT-S) 8.6-50 MG tablet Take 1 tablet by mouth 2 (two) times daily. Patient not taking: Reported on 11/13/2018 09/04/18   Rodolph Bonghompson, Daniel V, MD  tamsulosin [facility](FLOMAX) 0.4 MG CAPS capsule Take 1 capsule (0.4 mg total) by mouth daily after breakfast. Patient not taking: Reported on 11/13/2018 10/05/18   Narda BondsNettey, Ralph A, MD    Family History Family History  Problem Relation Age of Onset  . Hypertension Other   . Diabetes Other     Social History Social History   Tobacco Use  . Smoking status: Current Every Day Smoker  Packs/day: 1.00    Years: 20.00    Pack years: 20.00    Types: Cigarettes  . Smokeless tobacco: Current User  Substance Use Topics  . Alcohol use: Yes    Comment: Daily Drinker   . Drug use: Yes    Frequency: 7.0 times per week    Types: "Crack" cocaine, Cocaine, Marijuana     Allergies   Haldol [haloperidol]   Review of Systems Review of Systems  Constitutional: Negative for chills and fever.  HENT: Negative for congestion and facial swelling.   Eyes: Negative for discharge and visual disturbance.  Respiratory: Positive for shortness of breath.   Cardiovascular: Positive for leg swelling. Negative for chest pain and palpitations.  Gastrointestinal: Positive for abdominal distention. Negative for abdominal pain, diarrhea and vomiting.  Musculoskeletal: Negative for arthralgias and myalgias.  Skin: Negative for color change and rash.  Neurological: Negative for tremors, syncope and headaches.  Psychiatric/Behavioral: Negative for confusion and dysphoric mood.     Physical Exam Updated Vital Signs BP (!)  160/108   Pulse 84   Temp 98.7 F (37.1 C) (Oral)   Resp 19   Ht 5\' 9"  (1.753 m)   Wt 94.3 kg   SpO2 97%   BMI 30.72 kg/m   Physical Exam Vitals signs and nursing note reviewed.  Constitutional:      Appearance: He is well-developed.  HENT:     Head: Normocephalic and atraumatic.     Comments: Periorbital edema Eyes:     Pupils: Pupils are equal, round, and reactive to light.  Neck:     Musculoskeletal: Normal range of motion and neck supple.     Vascular: No JVD.  Cardiovascular:     Rate and Rhythm: Normal rate and regular rhythm.     Heart sounds: No murmur. No friction rub. No gallop.   Pulmonary:     Effort: No respiratory distress.     Breath sounds: No wheezing or rales.  Abdominal:     General: There is no distension.     Tenderness: There is no guarding or rebound.  Musculoskeletal: Normal range of motion.     Right lower leg: Edema present.     Left lower leg: Edema present.     Comments: 3+ edema that extends up into the patient's scrotum.  He has some mild abdominal wall edema.  Skin:    Coloration: Skin is not pale.     Findings: No rash.  Neurological:     Mental Status: He is alert and oriented to person, place, and time.  Psychiatric:        Behavior: Behavior normal.      ED Treatments / Results  Labs (all labs ordered are listed, but only abnormal results are displayed) Labs Reviewed  BRAIN NATRIURETIC PEPTIDE - Abnormal; Notable for the following components:      Result Value   B Natriuretic Peptide 1,076.2 (*)    All other components within normal limits  CBC WITH DIFFERENTIAL/PLATELET - Abnormal; Notable for the following components:   RBC 3.84 (*)    Hemoglobin 9.4 (*)    HCT 32.0 (*)    MCH 24.5 (*)    MCHC 29.4 (*)    RDW 19.9 (*)    All other components within normal limits  TROPONIN I - Abnormal; Notable for the following components:   Troponin I 0.04 (*)    All other components within normal limits  BASIC METABOLIC PANEL -  Abnormal; Notable for the  following components:   Glucose, Bld 126 (*)    BUN 23 (*)    Calcium 8.7 (*)    All other components within normal limits  POCT I-STAT EG7 - Abnormal; Notable for the following components:   pO2, Ven 51.0 (*)    Bicarbonate 32.0 (*)    TCO2 34 (*)    Acid-Base Excess 6.0 (*)    HCT 33.0 (*)    Hemoglobin 11.2 (*)    All other components within normal limits    EKG EKG Interpretation  Date/Time:  Friday Nov 13 2018 10:16:58 EDT Ventricular Rate:  91 PR Interval:    QRS Duration: 74 QT Interval:  380 QTC Calculation: 468 R Axis:   39 Text Interpretation:  Sinus rhythm Atrial premature complexes in couplets Anterior infarct, old Nonspecific T abnormalities, lateral leads No significant change since last tracing Confirmed by Melene Plan [phone]) on 11/13/2018 10:24:18 AM   Radiology Dg Chest Port 1 View  Result Date: 11/13/2018 CLINICAL DATA:  Shortness of breath and edema EXAM: PORTABLE CHEST 1 VIEW COMPARISON:  11/11/2018 FINDINGS: Cardiac shadow is enlarged in size but stable. Aortic calcifications are again seen. Scarring is noted in the left mid lung. The previously seen vascular congestion is improved when compared with the prior study. No focal infiltrate is seen. No sizable effusion is noted. IMPRESSION: Improvement of vascular congestion when compared with the prior study. Electronically Signed   By: Alcide Clever M.D.   On: 11/13/2018 10:47    Procedures Procedures (including critical care time)  Medications Ordered in ED Medications  loratadine (CLARITIN) tablet 10 mg (10 mg Oral Given 11/13/18 1035)  potassium chloride SA (K-DUR) CR tablet 40 mEq (40 mEq Oral Given 11/13/18 1104)  magnesium oxide (MAG-OX) tablet 800 mg (800 mg Oral Given 11/13/18 1104)  furosemide (LASIX) injection 40 mg (40 mg Intravenous Given 11/13/18 1105)     Initial Impression / Assessment and Plan / ED Course  I have reviewed the triage vital signs and the nursing  notes.  Pertinent labs & imaging results that were available during my care of the patient were reviewed by me and considered in my medical decision making (see chart for details).        58 yo M with a chief complaint of shortness of breath.  This is the patient's third visit in 3 days time.  He was seen yesterday just prior to [facility] to the prison.  The day before he had a chest x-ray and lab work that was at his baseline.  Patient tells me that his shortness of breath is gotten worse and his edema has gotten worse.  States that he has been taking his Lasix.  The patient is currently incarcerated and at multiple times states that he would like to stay in the hospital until his present time is up.  I think he is likely here for secondary gain.  I hear no rales on my lung exam.  He satting 94% on room air and talking in complete sentences without difficulty.  I expect that his edema is likely at his baseline.  Will obtain a chest x-ray laboratory evaluation give a dose of Lasix and reassess.  Trop positive but at baseline.  CXR viewed by me with improving vascular congestion.    The patient is feeling better and requesting discharge back to prison.  11:24 AM:  I have discussed the diagnosis/risks/treatment options with the patient and believe the pt to be eligible for discharge  home to follow-up with PCP. We also discussed returning to the ED immediately if new or worsening sx occur. We discussed the sx which are most concerning (e.g., sudden worsening pain, fever, inability to tolerate by mouth) that necessitate immediate return. Medications administered to the patient during their visit and any new prescriptions provided to the patient are listed below.  Medications given during this visit Medications  loratadine (CLARITIN) tablet 10 mg (10 mg Oral Given 11/13/18 1035)  potassium chloride SA (K-DUR) CR tablet 40 mEq (40 mEq Oral Given 11/13/18 1104)  magnesium oxide (MAG-OX) tablet 800 mg (800 mg  Oral Given 11/13/18 1104)  furosemide (LASIX) injection 40 mg (40 mg Intravenous Given 11/13/18 1105)     The patient appears reasonably screen and/or stabilized for discharge and I doubt any other medical condition or other [facility] requiring further screening, evaluation, or treatment in the ED at this time prior to discharge.    Final Clinical Impressions(s) / ED Diagnoses   Final diagnoses:  Peripheral edema    ED Discharge Orders    None       Melene Plan, DO 11/13/18 1124    Melene Plan, DO 11/13/18 1211

## 2018-11-13 NOTE — ED Triage Notes (Signed)
Patient arrived by [employer]. Pt c/o SOB and edema.   Pt c/o fluid on ABD area that's bothering him.   Pt has recurrent peripheral edema.

## 2018-11-13 NOTE — ED Notes (Signed)
Patient

## 2018-11-13 NOTE — ED Notes (Addendum)
Police deputy was given discharge instructions and paperwork. Patient became very uncooperative w/ staff. Patient not willing to stay for further evaluation from provider. Patient will be discharged to police custody. Police deputy given wheelchair for patient to be taken out of ED.

## 2018-11-13 NOTE — Discharge Instructions (Signed)
Double your lasix to 120mg  a day for the next couple days.  Follow up with your PCP.

## 2018-11-14 ENCOUNTER — Emergency Department (HOSPITAL_COMMUNITY)
Admission: EM | Admit: 2018-11-14 | Discharge: 2018-11-14 | Disposition: A | Discharge status: Home / Self Care | Payer: Medicaid Other | Attending: Emergency Medicine | Admitting: Emergency Medicine

## 2018-11-14 ENCOUNTER — Encounter (HOSPITAL_COMMUNITY): Payer: Self-pay | Admitting: Emergency Medicine

## 2018-11-14 ENCOUNTER — Other Ambulatory Visit: Payer: Self-pay

## 2018-11-14 DIAGNOSIS — I5042 Chronic combined systolic (congestive) and diastolic (congestive) heart failure: Secondary | ICD-10-CM | POA: Diagnosis not present

## 2018-11-14 DIAGNOSIS — I110 Hypertensive heart disease with heart failure: Secondary | ICD-10-CM | POA: Diagnosis not present

## 2018-11-14 DIAGNOSIS — R609 Edema, unspecified: Secondary | ICD-10-CM | POA: Insufficient documentation

## 2018-11-14 DIAGNOSIS — F17210 Nicotine dependence, cigarettes, uncomplicated: Secondary | ICD-10-CM | POA: Insufficient documentation

## 2018-11-14 DIAGNOSIS — E119 Type 2 diabetes mellitus without complications: Secondary | ICD-10-CM | POA: Insufficient documentation

## 2018-11-14 DIAGNOSIS — K6289 Other specified diseases of anus and rectum: Secondary | ICD-10-CM | POA: Diagnosis present

## 2018-11-14 DIAGNOSIS — Z5900 Homelessness unspecified: Secondary | ICD-10-CM

## 2018-11-14 NOTE — ED Triage Notes (Signed)
Pt here via [facility], pt called out due to cutting his rectum while pooping in the woods.  Reports crack and ETOH use in the last 24 hours. States he is suicidal.

## 2018-11-14 NOTE — ED Notes (Signed)
Pt declining any updates to family members.

## 2018-11-14 NOTE — ED Notes (Signed)
Security at bed side. Discharge instructions discussed with Pt. Pt verbalized understanding. Pt stable and leaving via WC.

## 2018-11-14 NOTE — ED Provider Notes (Signed)
[facility] EMERGENCY DEPARTMENT Provider Note   CSN: [id number] Arrival date & time: 11/14/18  1106    History   Chief Complaint Chief Complaint  Patient presents with  . Abdominal Pain  . rectum pain    HPI [name] is a 58 y.o. male.     58 y.o male with a PMH of Homelessness, Hep C, Schizophrenia, Polysubstance abuse presents to the ED via EMS after stating he cut " his rectum area" while [place]oping in the woods. Patient arrived to the ED covered in feces. He reports needing to talk to a Child psychotherapist as he needs home group placement. Patient has had 66 visits to the ED in the past 6 months, last seen yesterday for shortness of breath. Patient was incarcerated as of yesterday, receiving his medications while in jail. Today he request social work consult and crackers. He denies any chest pain, has abdominal swelling along with peripheral swelling which according to records and patient is at baseline.      Past Medical History:  Diagnosis Date  . Chronic foot pain   . Cocaine abuse (HCC)   . Diabetes mellitus without complication (HCC)   . Hepatitis C    unsure   . Homelessness   . Hypertension   . Neuropathy   . Polysubstance abuse (HCC)   . Schizophrenia (HCC)   . Sleep apnea   . Systolic and diastolic CHF, chronic [facility])     Patient Active Problem List   Diagnosis Date Noted  . Pressure injury of skin 11/08/2018  . Systolic and diastolic CHF, acute on chronic (HCC) 11/07/2018  . Elevated troponin 10/18/2018  . HLD (hyperlipidemia) 10/18/2018  . Anxiety 10/18/2018  . CHF (congestive heart failure), NYHA class II, acute on chronic, combined (HCC) 10/18/2018  . Hypertensive urgency 10/12/2018  . Mild renal insufficiency 10/12/2018  . Cellulitis 10/12/2018  . Penile cellulitis   . Adjustment disorder with mixed disturbance of emotions and conduct   . Sleep apnea 10/03/2018  . Hypokalemia 09/17/2018  . Scrotal swelling 09/17/2018  .  Urinary hesitancy   . Constipation   . Acute on chronic combined systolic and diastolic CHF (congestive heart failure) (HCC) 08/25/2018  . Acute respiratory failure with hypoxia (HCC) 08/25/2018  . Acute on chronic combined systolic and diastolic congestive heart failure (HCC) 08/18/2018  . Tobacco use 08/18/2018  . 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 Homelessness 08/08/2018  . Smoker 08/08/2018  . Acute exacerbation of CHF (congestive heart failure) (HCC) 03/28/2018  . Prostate enlargement 03/16/2018  . Aortic atherosclerosis (HCC) 03/16/2018  . 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 Chronic foot pain   . Schizoaffective disorder, bipolar type (HCC) 09/30/2016  . Substance induced mood disorder (HCC) 03/13/2015  . Acute kidney failure (HCC) 01/26/2015  . Schizophrenia, paranoid type (HCC) 01/17/2015  . Drug hallucinosis (HCC) 10/08/2014  . Chronic paranoid schizophrenia (HCC) 09/07/2014  . 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 Urinary retention   . Cocaine use disorder, severe, dependence (HCC)   . Essential hypertension, benign 03/28/2013  . 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 MULTIPLE TOOTH EXTRACTIONS          Home Medications    Prior to Admission medications   Medication Sig Start Date End Date Taking? Authorizing Provider  ARIPiprazole (ABILIFY) 10 MG tablet Take 1 tablet (10 mg total) by mouth daily. Patient not taking: Reported on 11/13/2018 09/18/18   Meredeth Ide, MD  aspirin EC 81 MG tablet Take 1 tablet (81 mg total) by mouth daily. Patient not taking: Reported on 11/13/2018 10/19/18 02/16/19  Meredeth Ide, MD  atorvastatin (LIPITOR) 40 MG tablet Take 1 tablet (40 mg total) by mouth daily. Patient not taking: Reported  on 11/13/2018 09/18/18   Meredeth Ide, MD  bisacodyl (DULCOLAX) 5 MG EC tablet Take 1 tablet (5 mg total) by mouth daily as needed for moderate constipation. Patient not taking: Reported on 11/13/2018 09/04/18   Rodolph Bong, MD  carbamazepine (TEGRETOL) 200 MG tablet Take 0.5 tablets (100 mg total) by mouth 2 (two) times daily. Patient not taking: Reported on 11/13/2018 09/18/18   Meredeth Ide, MD  carvedilol (COREG) 6.25 MG tablet Take 1 tablet (6.25 mg total) by mouth 2 (two) times daily with a meal. Patient not taking: Reported on 11/13/2018 10/19/18   Meredeth Ide, MD  cephALEXin (KEFLEX) 500 MG capsule Take 1 capsule (500 mg total) by mouth 4 (four) times daily. Patient not taking: Reported on 11/13/2018 10/22/18   Roxy Horseman, PA-C  furosemide (LASIX) 20 MG tablet Take 3 tablets (60 mg total) by mouth daily for 30 days. Patient not taking: Reported on 11/13/2018 11/11/18 12/11/18  Michela Pitcher A, PA-C  hydrocortisone (PROCTO-PAK) 1 % CREA Apply twice a day to hemorrhoids. Patient not taking: Reported on 11/13/2018 09/29/18   Renne Crigler, PA-C  Insulin Glargine (LANTUS) 100 UNIT/ML Solostar Pen Inject 14 Units into the skin daily. Patient not taking: Reported on 11/13/2018 09/18/18   Meredeth Ide, MD  Insulin Pen Needle 29G X MISC Use with insulin 09/18/18   Meredeth Ide, MD  lisinopril (PRINIVIL,ZESTRIL) 10 MG tablet Take 1 tablet (10 mg total) by mouth daily. Patient not taking: Reported on 11/13/2018 09/18/18   Meredeth Ide, MD  nicotine (NICODERM CQ - DOSED IN MG/24 HOURS) 21 mg/24hr patch Place 1 patch (21 mg total) onto the skin daily. Patient not taking: Reported on 11/13/2018 09/04/18   Rodolph Bong, MD  polyethylene glycol [facility]) packet Take 17 g by mouth 2 (two) times daily. Patient not taking: Reported on 11/13/2018 09/18/18   Meredeth Ide, MD  Potassium Chloride ER 20 MEQ TBCR Take 20 mEq by mouth daily. Patient not taking: Reported on 11/13/2018 09/18/18    Meredeth Ide, MD  senna-docusate (SENOKOT-S) 8.6-50 MG tablet Take 1 tablet by mouth 2 (two) times daily. Patient not taking: Reported on 11/13/2018 09/04/18   Rodolph Bong, MD  tamsulosin [facility]) 0.4 MG CAPS capsule Take 1 capsule (0.4 mg total) by mouth daily after breakfast. Patient not taking: Reported on 11/13/2018 10/05/18   Narda Bonds, MD    Family History Family History  Problem Relation Age of Onset  . Hypertension Other   . Diabetes Other     Social History Social History   Tobacco Use  . Smoking status: Current Every Day Smoker    Packs/day: 1.00    Years: 20.00    Pack years: 20.00    Types: Cigarettes  . Smokeless tobacco: Current User  Substance Use Topics  . Alcohol use: Yes    Comment: Daily Drinker   . Drug use: Yes    Frequency: 7.0 times per week    Types: "Crack" cocaine, Cocaine, Marijuana  Comment: used yesterday      Allergies   Haldol [haloperidol]   Review of Systems Review of Systems  Constitutional: Negative for chills and fever.  HENT: Negative for ear pain and sore throat.   Eyes: Negative for pain and visual disturbance.  Respiratory: Positive for shortness of breath. Negative for cough.   Cardiovascular: Positive for leg swelling. Negative for chest pain and palpitations.  Gastrointestinal: Negative for abdominal pain and vomiting.  Genitourinary: Positive for penile swelling and scrotal swelling. Negative for dysuria and hematuria.  Musculoskeletal: Negative for arthralgias and back pain.  Skin: Negative for color change and rash.  Neurological: Negative for seizures and syncope.  All other systems reviewed and are negative.    Physical Exam Updated Vital Signs BP (!) 161/86   Pulse 98   Temp 98.9 F (37.2 C) (Oral)   Resp 19   SpO2 98%   Physical Exam Vitals signs and nursing note reviewed.  Constitutional:      Comments: Disheveled.   HENT:     Head: Normocephalic and atraumatic.  Eyes:     General: No  scleral icterus.    Pupils: Pupils are equal, round, and reactive to light.  Neck:     Musculoskeletal: Normal range of motion.  Cardiovascular:     Heart sounds: Normal heart sounds.     Comments: 2+ pitting edema BLE.  Pulmonary:     Effort: Pulmonary effort is normal.     Breath sounds: No decreased breath sounds, wheezing or rales.     Comments: Lungs are diminished to auscultation. Chest:     Chest wall: No tenderness.  Abdominal:     General: Bowel sounds are normal. There is no distension.     Palpations: Abdomen is soft.     Tenderness: There is no abdominal tenderness.  Musculoskeletal:        General: No tenderness or deformity.     Right lower leg: 2+ Pitting Edema present.     Left lower leg: 2+ Pitting Edema present.  Skin:    General: Skin is warm and dry.  Neurological:     Mental Status: He is alert and oriented to person, place, and time.      ED Treatments / Results  Labs (all labs ordered are listed, but only abnormal results are displayed) Labs Reviewed - No data to display  EKG EKG Interpretation  Date/Time:  Saturday Nov 14 2018 11:33:43 EDT Ventricular Rate:  98 PR Interval:    QRS Duration: 89 QT Interval:  326 QTC Calculation: 417 R Axis:   85 Text Interpretation:  Sinus rhythm Anterior infarct, old Borderline repolarization abnormality No significant change since last tracing Confirmed by Linwood Dibbles [phone]) on 11/14/2018 11:55:21 AM   Radiology Dg Chest Port 1 View  Result Date: 11/13/2018 CLINICAL DATA:  Shortness of breath and edema EXAM: PORTABLE CHEST 1 VIEW COMPARISON:  11/11/2018 FINDINGS: Cardiac shadow is enlarged in size but stable. Aortic calcifications are again seen. Scarring is noted in the left mid lung. The previously seen vascular congestion is improved when compared with the prior study. No focal infiltrate is seen. No sizable effusion is noted. IMPRESSION: Improvement of vascular congestion when compared with the prior  study. Electronically Signed   By: Alcide Clever M.D.   On: 11/13/2018 10:47    Procedures Procedures (including critical care time)  Medications Ordered in ED Medications - No data to display   Initial Impression / Assessment and Plan / ED Course  I have reviewed the triage vital signs and the nursing notes.  Pertinent labs & imaging results that were available during my care of the patient were reviewed by me and considered in my medical decision making (see chart for details).  Patient with a past medical history of polysubstance abuse, peripheral edema, diabetes presents to the ED for his 67th visit the past 6 months.  Patient reports he feels his abdomen is swollen, legs are swollen and he would like a social work call in order to obtain home group placement.  Patient was last seen yesterday, today he arrives via EMS satting at 99% with 2 L of nasal cannula, this was removed by me, patient continues to sat at 99% on room air with a normal [place].  Patient states he has been taking his Lasix, last received 40 mg Lasix injection yesterday.  His chest x-ray from yesterday showed improvement of vascular congestion compared to the prior day. Patient is speaking in complete sentences, does have peripheral edema throughout his lower extremities, this seems to improve according to his previous visit. EKG showed no changes compared to the previous.  Patient during today's arrival is requesting milk and crackers along with Tylenol and Lasix.  Patient was incarcerated yesterday, this is his third visit in the past 3 days.  According to patient's care plan patient has been given a plethora of resources for shelters along with placement.  Call placed for social work recommendations.  1:17 PM Unable to reach social work, according to chart records patient has been given multiple resources, he arrived in the ED covered in feces.  Patient has been cleaned out by staff, his vitals are unremarkable, satting at  98% on room air.  Patient is stable for discharge.   Final Clinical Impressions(s) / ED Diagnoses   Final diagnoses:  Peripheral edema  Homelessness    ED Discharge Orders    None       Claude MangesSoto, Tyrus Wilms, Cordelia Poche-C 11/14/18 1318    Linwood DibblesKnapp, Jon, MD 11/15/18 1542

## 2018-11-14 NOTE — Progress Notes (Signed)
CSW received consult. CSW attempted to call number listed for consult and noted it went to voicemail and was unable to take voicemail. CSW will follow to provide support for patient's ED visit.  Tenna Delaine, LCSW, LCAS-A Clinical Social Worker II [phone]

## 2018-11-15 ENCOUNTER — Other Ambulatory Visit: Payer: Self-pay

## 2018-11-15 ENCOUNTER — Emergency Department (HOSPITAL_COMMUNITY): Payer: Medicaid Other

## 2018-11-15 ENCOUNTER — Emergency Department (HOSPITAL_COMMUNITY)
Admission: EM | Admit: 2018-11-15 | Discharge: 2018-11-15 | Disposition: A | Discharge status: Home / Self Care | Payer: Medicaid Other | Attending: Emergency Medicine | Admitting: Emergency Medicine

## 2018-11-15 ENCOUNTER — Encounter (HOSPITAL_COMMUNITY): Payer: Self-pay

## 2018-11-15 ENCOUNTER — Emergency Department (HOSPITAL_COMMUNITY)
Admission: EM | Admit: 2018-11-15 | Discharge: 2018-11-15 | Disposition: A | Discharge status: Home / Self Care | Payer: Medicaid Other | Source: Home / Self Care | Attending: Emergency Medicine | Admitting: Emergency Medicine

## 2018-11-15 ENCOUNTER — Encounter (HOSPITAL_COMMUNITY): Payer: Self-pay | Admitting: Emergency Medicine

## 2018-11-15 DIAGNOSIS — F1290 Cannabis use, unspecified, uncomplicated: Secondary | ICD-10-CM | POA: Insufficient documentation

## 2018-11-15 DIAGNOSIS — F17210 Nicotine dependence, cigarettes, uncomplicated: Secondary | ICD-10-CM | POA: Insufficient documentation

## 2018-11-15 DIAGNOSIS — Z590 Homelessness: Secondary | ICD-10-CM | POA: Insufficient documentation

## 2018-11-15 DIAGNOSIS — Z7982 Long term (current) use of aspirin: Secondary | ICD-10-CM | POA: Diagnosis not present

## 2018-11-15 DIAGNOSIS — E119 Type 2 diabetes mellitus without complications: Secondary | ICD-10-CM | POA: Insufficient documentation

## 2018-11-15 DIAGNOSIS — Z79899 Other long term (current) drug therapy: Secondary | ICD-10-CM | POA: Diagnosis not present

## 2018-11-15 DIAGNOSIS — R609 Edema, unspecified: Secondary | ICD-10-CM | POA: Insufficient documentation

## 2018-11-15 DIAGNOSIS — M79606 Pain in leg, unspecified: Secondary | ICD-10-CM | POA: Diagnosis present

## 2018-11-15 DIAGNOSIS — F1490 Cocaine use, unspecified, uncomplicated: Secondary | ICD-10-CM | POA: Insufficient documentation

## 2018-11-15 DIAGNOSIS — E1165 Type 2 diabetes mellitus with hyperglycemia: Secondary | ICD-10-CM | POA: Diagnosis not present

## 2018-11-15 DIAGNOSIS — R0602 Shortness of breath: Secondary | ICD-10-CM | POA: Diagnosis not present

## 2018-11-15 DIAGNOSIS — R600 Localized edema: Secondary | ICD-10-CM | POA: Diagnosis not present

## 2018-11-15 DIAGNOSIS — N5089 Other specified disorders of the male genital organs: Secondary | ICD-10-CM | POA: Insufficient documentation

## 2018-11-15 DIAGNOSIS — I5042 Chronic combined systolic (congestive) and diastolic (congestive) heart failure: Secondary | ICD-10-CM | POA: Insufficient documentation

## 2018-11-15 DIAGNOSIS — R739 Hyperglycemia, unspecified: Secondary | ICD-10-CM

## 2018-11-15 DIAGNOSIS — M7989 Other specified soft tissue disorders: Secondary | ICD-10-CM

## 2018-11-15 LAB — COMPREHENSIVE METABOLIC PANEL
ALT: 19 U/L (ref 0–44)
AST: 32 U/L (ref 15–41)
Albumin: 2.8 g/dL — ABNORMAL LOW (ref 3.5–5.0)
Alkaline Phosphatase: 129 U/L — ABNORMAL HIGH (ref 38–126)
Anion gap: 12 (ref 5–15)
BUN: 18 mg/dL (ref 6–20)
CO2: 28 mmol/L (ref 22–32)
Calcium: 8.8 mg/dL — ABNORMAL LOW (ref 8.9–10.3)
Chloride: 101 mmol/L (ref 98–111)
Creatinine, Ser: 1.19 mg/dL (ref 0.61–1.24)
GFR calc Af Amer: >60 mL/min (ref >60)
GFR calc non Af Amer: >60 mL/min (ref >60)
Glucose, Bld: 343 mg/dL — ABNORMAL HIGH (ref 70–99)
Potassium: 3.7 mmol/L (ref 3.5–5.1)
Sodium: 141 mmol/L (ref 135–145)
Total Bilirubin: 0.6 mg/dL (ref 0.3–1.2)
Total Protein: 6.8 g/dL (ref 6.5–8.1)

## 2018-11-15 LAB — CBC WITH DIFFERENTIAL/PLATELET
Abs Immature Granulocytes: 0.02 10*3/uL (ref 0.00–0.07)
Basophils Absolute: 0.1 10*3/uL (ref 0.0–0.1)
Basophils Relative: 1 %
Eosinophils Absolute: 0.3 10*3/uL (ref 0.0–0.5)
Eosinophils Relative: 4 %
HCT: 33.8 % — ABNORMAL LOW (ref 39.0–52.0)
Hemoglobin: 9.7 g/dL — ABNORMAL LOW (ref 13.0–17.0)
Immature Granulocytes: 0 %
Lymphocytes Relative: 8 %
Lymphs Abs: 0.7 10*3/uL (ref 0.7–4.0)
MCH: 23.7 pg — ABNORMAL LOW (ref 26.0–34.0)
MCHC: 28.7 g/dL — ABNORMAL LOW (ref 30.0–36.0)
MCV: 82.4 fL (ref 80.0–100.0)
Monocytes Absolute: 1 10*3/uL (ref 0.1–1.0)
Monocytes Relative: 11 %
Neutro Abs: 6.5 10*3/uL (ref 1.7–7.7)
Neutrophils Relative %: 76 %
Platelets: 244 10*3/uL (ref 150–400)
RBC: 4.1 MIL/uL — ABNORMAL LOW (ref 4.22–5.81)
RDW: 20 % — ABNORMAL HIGH (ref 11.5–15.5)
WBC: 8.5 10*3/uL (ref 4.0–10.5)
nRBC: 0.4 % — ABNORMAL HIGH (ref 0.0–0.2)

## 2018-11-15 LAB — BRAIN NATRIURETIC PEPTIDE: B Natriuretic Peptide: 967.3 pg/mL — ABNORMAL HIGH (ref 0.0–100.0)

## 2018-11-15 LAB — TROPONIN I: Troponin I: 0.04 ng/mL (ref <0.03)

## 2018-11-15 MED ORDER — FUROSEMIDE 20 MG PO TABS
40.0000 mg | ORAL_TABLET | Freq: Once | ORAL | Status: AC
  Administered 2018-11-15: 40 mg via ORAL
  Filled 2018-11-15: qty 2

## 2018-11-15 MED ORDER — FUROSEMIDE 20 MG PO TABS
60.0000 mg | ORAL_TABLET | Freq: Once | ORAL | Status: AC
  Administered 2018-11-15: 60 mg via ORAL
  Filled 2018-11-15: qty 3

## 2018-11-15 NOTE — Progress Notes (Addendum)
10:05AM: CSW met with patient to discuss discharge disposition and provide contact information with [facility]. Patient did not participate in discussion and CSW was unable to discuss in detail appropriate follow-up. CSW left [facility] contact information at patient bedside.   CSW received consult for patient which was similar to consult on 11/14/2018. CSW noted patient is requesting FL-2 to assist with group home placement. At this time with patient's history it would not be appropriate for CSW to pursue placement unless patient has a rehab condition or condition required inpatient admission. Additionally, CSW attempted to discuss disposition on 11/14/2018 and had left. CSW notes patient would need to work with [facility] for support in being sent to a group home. CSW will provide patient with resources to contact [place]. CSW will continue to follow for additional social work needs.  Lamonte Richer, LCSW, [place] Worker II [phone]

## 2018-11-15 NOTE — Progress Notes (Signed)
CSW was contacted by PA regarding patient attempted to return to ED after being discharged not long ago. CSW noted CSW provided FL-2 per patient request and noted patient has no PCP. CSW met with patient and provided additional copy and informed patient that this is as much as the hospital can do to support patient working in a group home and provided patient additional copy of [facility] contact information. CSW advised patient that placement is not the ED's role and to follow up with the [facility] to get appropriate supports. CSW noted FL-2 was approved by attending signature and informed patient this is not the hospital beginning placement but provided the patient with accurate required documentation to support community housing follow-up.  CSW notes that is patient returns to ED he be continued to be encouraged to follow up with [facility] and other community placements and informed patient the process of being sent to a group home can be a lengthy process and not an appropriate reason to present to the ED.   Lamonte Richer, LCSW, [place] Worker II [phone]

## 2018-11-15 NOTE — [place] FL2 (Signed)
[place] MEDICAID FL2 LEVEL OF CARE SCREENING TOOL     IDENTIFICATION  Patient Name: [name] Birthdate: [birth year] Sex: male Admission Date (Current Location): 11/15/2018  [place] and [place] Number:  [name] [id number] Facility and Address:  [facility], [address] [place], [place] [address]      Provider Number: [id number]  Attending Physician Name and Address:  Vanetta Mulders, MD  Relative Name and Phone Number:       Current Level of Care: Hospital Recommended Level of Care: Other (Comment)(Group home) Prior Approval Number:    Date Approved/Denied:   PASRR Number: N/A  Discharge Plan: Other (Comment)(Group)    Current Diagnoses: Patient Active Problem List   Diagnosis Date Noted  . Pressure injury of skin 11/08/2018  . Systolic and diastolic CHF, acute on chronic (HCC) 11/07/2018  . Elevated troponin 10/18/2018  . HLD (hyperlipidemia) 10/18/2018  . Anxiety 10/18/2018  . CHF (congestive heart failure), NYHA class II, acute on chronic, combined (HCC) 10/18/2018  . Hypertensive urgency 10/12/2018  . Mild renal insufficiency 10/12/2018  . Cellulitis 10/12/2018  . Penile cellulitis   . Adjustment disorder with mixed disturbance of emotions and conduct   . Sleep apnea 10/03/2018  . Hypokalemia 09/17/2018  . Scrotal swelling 09/17/2018  . Urinary hesitancy   . Constipation   . Acute on chronic combined systolic and diastolic CHF (congestive heart failure) (HCC) 08/25/2018  . Acute respiratory failure with hypoxia (HCC) 08/25/2018  . Acute on chronic combined systolic and diastolic congestive heart failure (HCC) 08/18/2018  . Tobacco use 08/18/2018  . 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 Homelessness 08/08/2018  . Smoker 08/08/2018  . Acute exacerbation of CHF (congestive heart failure) (HCC) 03/28/2018  . Prostate enlargement 03/16/2018  .  Aortic atherosclerosis (HCC) 03/16/2018  . 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 Chronic foot pain   . Schizoaffective disorder, bipolar type (HCC) 09/30/2016  . Substance induced mood disorder (HCC) 03/13/2015  . Acute kidney failure (HCC) 01/26/2015  . Schizophrenia, paranoid type (HCC) 01/17/2015  . Drug hallucinosis (HCC) 10/08/2014  . Chronic paranoid schizophrenia (HCC) 09/07/2014  . 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 Urinary retention   . Cocaine use disorder, severe, dependence (HCC)   . Essential hypertension, benign 03/28/2013  . Insulin-requiring or dependent type II diabetes mellitus (HCC) 03/15/2013    Orientation RESPIRATION BLADDER Height & Weight     Self, Time, Situation, Place  Normal(Room) Continent Weight: 208 lb (94.3 kg) Height:  5\' 9"  (175.3 cm)  BEHAVIORAL SYMPTOMS/MOOD NEUROLOGICAL BOWEL NUTRITION STATUS  Dangerous to self, others or property   Continent    AMBULATORY STATUS COMMUNICATION OF NEEDS Skin   Independent Verbally Skin abrasions(Wounds)                       Personal Care Assistance Level of Assistance              Functional Limitations Info             SPECIAL CARE FACTORS FREQUENCY                       Contractures Contractures Info: Not present    Additional Factors Info                  Current Medications (11/15/2018):  This  is the current hospital active medication list No current facility-administered medications for this encounter.    Current Outpatient Medications  Medication Sig Dispense Refill  . ARIPiprazole (ABILIFY) 10 MG tablet Take 1 tablet (10 mg total) by mouth daily. (Patient not taking: Reported on 11/13/2018) 30 tablet 1  . aspirin EC 81 MG tablet Take 1 tablet (81 mg total) by mouth daily. (Patient not taking: Reported on 11/13/2018) 30 tablet 3  . atorvastatin (LIPITOR) 40 MG tablet Take 1 tablet (40  mg total) by mouth daily. (Patient not taking: Reported on 11/13/2018) 30 tablet 1  . bisacodyl (DULCOLAX) 5 MG EC tablet Take 1 tablet (5 mg total) by mouth daily as needed for moderate constipation. (Patient not taking: Reported on 11/13/2018) 30 tablet 0  . carbamazepine (TEGRETOL) 200 MG tablet Take 0.5 tablets (100 mg total) by mouth 2 (two) times daily. (Patient not taking: Reported on 11/13/2018) 60 tablet 2  . carvedilol (COREG) 6.25 MG tablet Take 1 tablet (6.25 mg total) by mouth 2 (two) times daily with a meal. (Patient not taking: Reported on 11/13/2018) 60 tablet 2  . cephALEXin (KEFLEX) 500 MG capsule Take 1 capsule (500 mg total) by mouth 4 (four) times daily. (Patient not taking: Reported on 11/13/2018) 28 capsule 0  . furosemide (LASIX) 20 MG tablet Take 3 tablets (60 mg total) by mouth daily for 30 days. (Patient not taking: Reported on 11/13/2018) 90 tablet 0  . hydrocortisone (PROCTO-PAK) 1 % CREA Apply twice a day to hemorrhoids. (Patient not taking: Reported on 11/13/2018) 1 Tube 0  . Insulin Glargine (LANTUS) 100 UNIT/ML Solostar Pen Inject 14 Units into the skin daily. (Patient not taking: Reported on 11/13/2018) 15 mL 2  . lisinopril (PRINIVIL,ZESTRIL) 10 MG tablet Take 1 tablet (10 mg total) by mouth daily. (Patient not taking: Reported on 11/13/2018) 30 tablet 1  . nicotine (NICODERM CQ - DOSED IN MG/24 HOURS) 21 mg/24hr patch Place 1 patch (21 mg total) onto the skin daily. (Patient not taking: Reported on 11/13/2018) 28 patch 0  . polyethylene glycol (MIRALAX / GLYCOLAX) packet Take 17 g by mouth 2 (two) times daily. (Patient not taking: Reported on 11/13/2018) 60 each 0  . Potassium Chloride ER 20 MEQ TBCR Take 20 mEq by mouth daily. (Patient not taking: Reported on 11/13/2018) 30 tablet 1  . senna-docusate (SENOKOT-S) 8.6-50 MG tablet Take 1 tablet by mouth 2 (two) times daily. (Patient not taking: Reported on 11/13/2018)    . tamsulosin (FLOMAX) 0.4 MG CAPS capsule Take 1 capsule (0.4  mg total) by mouth daily after breakfast. (Patient not taking: Reported on 11/13/2018) 30 capsule 0     Discharge Medications: Please see discharge summary for a list of discharge medications.  Relevant Imaging Results:  Relevant Lab Results:   Additional Information    Arian Murley Geralynn Ochs Najmah Carradine, LCSW

## 2018-11-15 NOTE — ED Provider Notes (Signed)
[facility] EMERGENCY DEPARTMENT Provider Note   CSN: [id number] Arrival date & time: 11/15/18  4098    History   Chief Complaint Chief Complaint  Patient presents with   Leg Swelling    HPI [name] is a 58 y.o. male with past medical history of schizophrenia, hepatitis C, homelessness, polysubstance abuse presenting to emergency department today with chief complaint of leg pain and shortness of breath.  Patient states this is a chronic problem, but today his legs hurt "alittle more than usual."  He cannot describe the pain, just repeatedly says "my legs hurt."  He admit to feeling short of breath, describing it as he is working harder to breath and cannot walk as far as he usually does.  Patient also reports he is here trying to get paperwork from social work in order to be placed in a group home.  He admits to using cocaine yesterday. Patient visits the emergency department frequently, he has been seen daily over the last 5 days.  Record shows he has had 67 visits in the past 6 months his most recent being yesterday for abdominal pain and rectal pain.  He denies any chest pain, fever, chills, nausea, vomiting, difficulty urinating, recent falls, numbness or weakness.  History provided by patient with additional history obtained from chart review.       Past Medical History:  Diagnosis Date   Chronic foot pain    Cocaine abuse (HCC)    Diabetes mellitus without complication (HCC)    Hepatitis C    unsure    Homelessness    Hypertension    Neuropathy    Polysubstance abuse (HCC)    Schizophrenia (HCC)    Sleep apnea    Systolic and diastolic CHF, chronic (HCC)     Patient Active Problem List   Diagnosis Date Noted   Pressure injury of skin 11/08/2018   Systolic and diastolic CHF, acute on chronic (HCC) 11/07/2018   Elevated troponin 10/18/2018   HLD (hyperlipidemia) 10/18/2018   Anxiety 10/18/2018   CHF (congestive heart  failure), NYHA class II, acute on chronic, combined (HCC) 10/18/2018   Hypertensive urgency 10/12/2018   Mild renal insufficiency 10/12/2018   Cellulitis 10/12/2018   Penile cellulitis    Adjustment disorder with mixed disturbance of emotions and conduct    Sleep apnea 10/03/2018   Hypokalemia 09/17/2018   Scrotal swelling 09/17/2018   Urinary hesitancy    Constipation    Acute on chronic combined systolic and diastolic CHF (congestive heart failure) (HCC) 08/25/2018   Acute respiratory failure with hypoxia (HCC) 08/25/2018   Acute on chronic combined systolic and diastolic congestive heart failure (HCC) 08/18/2018   Tobacco use 08/18/2018   Acute on chronic combined systolic (congestive) and diastolic (congestive) heart failure (HCC) 08/08/2018   Homelessness 08/08/2018   Smoker 08/08/2018   Acute exacerbation of CHF (congestive heart failure) (HCC) 03/28/2018   Prostate enlargement 03/16/2018   Aortic atherosclerosis (HCC) 03/16/2018   Aneurysm of abdominal aorta (HCC) 03/16/2018   Chronic foot pain    Schizoaffective disorder, bipolar type (HCC) 09/30/2016   Substance induced mood disorder (HCC) 03/13/2015   Acute kidney failure (HCC) 01/26/2015   Schizophrenia, paranoid type (HCC) 01/17/2015   Drug hallucinosis (HCC) 10/08/2014   Chronic paranoid schizophrenia (HCC) 09/07/2014   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/10/2014   Urinary retention    Cocaine use disorder, severe, dependence (HCC)    Essential hypertension,  benign 03/28/2013   Insulin-requiring or dependent type II diabetes mellitus (HCC) 03/15/2013    Past Surgical History:  Procedure Laterality Date   MULTIPLE TOOTH EXTRACTIONS          Home Medications    Prior to Admission medications   Medication Sig Start Date End Date Taking? Authorizing Provider  ARIPiprazole (ABILIFY) 10 MG tablet Take 1 tablet (10  mg total) by mouth daily. Patient not taking: Reported on 11/13/2018 09/18/18   Meredeth Ide, MD  aspirin EC 81 MG tablet Take 1 tablet (81 mg total) by mouth daily. Patient not taking: Reported on 11/13/2018 10/19/18 02/16/19  Meredeth Ide, MD  atorvastatin (LIPITOR) 40 MG tablet Take 1 tablet (40 mg total) by mouth daily. Patient not taking: Reported on 11/13/2018 09/18/18   Meredeth Ide, MD  bisacodyl (DULCOLAX) 5 MG EC tablet Take 1 tablet (5 mg total) by mouth daily as needed for moderate constipation. Patient not taking: Reported on 11/13/2018 09/04/18   Rodolph Bong, MD  carbamazepine (TEGRETOL) 200 MG tablet Take 0.5 tablets (100 mg total) by mouth 2 (two) times daily. Patient not taking: Reported on 11/13/2018 09/18/18   Meredeth Ide, MD  carvedilol (COREG) 6.25 MG tablet Take 1 tablet (6.25 mg total) by mouth 2 (two) times daily with a meal. Patient not taking: Reported on 11/13/2018 10/19/18   Meredeth Ide, MD  cephALEXin (KEFLEX) 500 MG capsule Take 1 capsule (500 mg total) by mouth 4 (four) times daily. Patient not taking: Reported on 11/13/2018 10/22/18   Roxy Horseman, PA-C  furosemide (LASIX) 20 MG tablet Take 3 tablets (60 mg total) by mouth daily for 30 days. Patient not taking: Reported on 11/13/2018 11/11/18 12/11/18  Michela Pitcher A, PA-C  hydrocortisone (PROCTO-PAK) 1 % CREA Apply twice a day to hemorrhoids. Patient not taking: Reported on 11/13/2018 09/29/18   Renne Crigler, PA-C  Insulin Glargine (LANTUS) 100 UNIT/ML Solostar Pen Inject 14 Units into the skin daily. Patient not taking: Reported on 11/13/2018 09/18/18   Meredeth Ide, MD  lisinopril (PRINIVIL,ZESTRIL) 10 MG tablet Take 1 tablet (10 mg total) by mouth daily. Patient not taking: Reported on 11/13/2018 09/18/18   Meredeth Ide, MD  nicotine (NICODERM CQ - DOSED IN MG/24 HOURS) 21 mg/24hr patch Place 1 patch (21 mg total) onto the skin daily. Patient not taking: Reported on 11/13/2018 09/04/18   Rodolph Bong, MD    polyethylene glycol [facility]thelene Hal) packet Take 17 g by mouth 2 (two) times daily. Patient not taking: Reported on 11/13/2018 09/18/18   Meredeth Ide, MD  Potassium Chloride ER 20 MEQ TBCR Take 20 mEq by mouth daily. Patient not taking: Reported on 11/13/2018 09/18/18   Meredeth Ide, MD  senna-docusate (SENOKOT-S) 8.6-50 MG tablet Take 1 tablet by mouth 2 (two) times daily. Patient not taking: Reported on 11/13/2018 09/04/18   Rodolph Bong, MD  tamsulosin [facility]) 0.4 MG CAPS capsule Take 1 capsule (0.4 mg total) by mouth daily after breakfast. Patient not taking: Reported on 11/13/2018 10/05/18   Narda Bonds, MD    Family History Family History  Problem Relation Age of Onset   Hypertension Other    Diabetes Other     Social History Social History   Tobacco Use   Smoking status: Current Every Day Smoker    Packs/day: 1.00    Years: 20.00    Pack years: 20.00    Types: Cigarettes   Smokeless tobacco:  Current User  Substance Use Topics   Alcohol use: Yes    Comment: Daily Drinker    Drug use: Yes    Frequency: 7.0 times per week    Types: "Crack" cocaine, Cocaine, Marijuana    Comment: used yesterday      Allergies   Haldol [haloperidol]   Review of Systems Review of Systems  Constitutional: Negative for chills and fever.  HENT: Negative for congestion, rhinorrhea, sinus pressure and sore throat.   Eyes: Negative for pain and redness.  Respiratory: Positive for shortness of breath. Negative for cough and wheezing.   Cardiovascular: Positive for leg swelling. Negative for chest pain and palpitations.  Gastrointestinal: Negative for abdominal pain, constipation, diarrhea, nausea and vomiting.  Genitourinary: Positive for penile swelling and scrotal swelling. Negative for dysuria.  Musculoskeletal: Negative for arthralgias, back pain, myalgias and neck pain.  Skin: Negative for rash and wound.  Neurological: Negative for dizziness, syncope, weakness,  numbness and headaches.  Psychiatric/Behavioral: Negative for confusion.     Physical Exam Updated Vital Signs BP (!) 167/110    Pulse 98    Temp 97.9 F (36.6 C) (Oral)    Resp 17    Ht 5\' 9"  (1.753 m)    Wt 94.3 kg    SpO2 100%    BMI 30.72 kg/m   Physical Exam Vitals signs and nursing note reviewed. Exam conducted with a chaperone present.  Constitutional:      Appearance: He is well-developed. He is not ill-appearing or toxic-appearing.     Comments: Pt is disheveled.  HENT:     Head: Normocephalic and atraumatic.     Nose: Nose normal.     Mouth/Throat:     Mouth: Mucous membranes are moist.     Pharynx: Oropharynx is clear.  Eyes:     General: No scleral icterus.       Right eye: No discharge.        Left eye: No discharge.     Conjunctiva/sclera: Conjunctivae normal.  Neck:     Musculoskeletal: Normal range of motion. No muscular tenderness.     Vascular: No JVD.  Cardiovascular:     Rate and Rhythm: Normal rate and regular rhythm.     Pulses: Normal pulses.          Radial pulses are 2+ on the right side and 2+ on the left side.     Heart sounds: Normal heart sounds. No murmur. No friction rub. No gallop.   Pulmonary:     Effort: Pulmonary effort is normal. No respiratory distress.     Breath sounds: No wheezing, rhonchi or rales.     Comments: Lung sounds diminished throughout. Chest:     Chest wall: No tenderness.  Abdominal:     General: Bowel sounds are normal. There is no distension.     Tenderness: There is no abdominal tenderness. There is no guarding or rebound.     Comments: Pt has mild edema of abdominal wall.  Genitourinary:    Penis: Swelling present. No tenderness.      Scrotum/Testes:        Right: Tenderness not present.        Left: Tenderness not present.  Musculoskeletal: Normal range of motion.     Right lower leg: 2+ Edema present.     Left lower leg: 2+ Edema present.     Comments: Bilateral 2+ edema extending into scrotum.   Skin:     General: Skin is warm and  dry.  Neurological:     Mental Status: He is oriented to person, place, and time.     Comments: Fluent speech, no facial droop.  Psychiatric:        Behavior: Behavior normal.      ED Treatments / Results  Labs (all labs ordered are listed, but only abnormal results are displayed) Labs Reviewed  CBC WITH DIFFERENTIAL/PLATELET - Abnormal; Notable for the following components:      Result Value   RBC 4.10 (*)    Hemoglobin 9.7 (*)    HCT 33.8 (*)    MCH 23.7 (*)    MCHC 28.7 (*)    RDW 20.0 (*)    nRBC 0.4 (*)    All other components within normal limits  TROPONIN I - Abnormal; Notable for the following components:   Troponin I 0.04 (*)    All other components within normal limits  BRAIN NATRIURETIC PEPTIDE - Abnormal; Notable for the following components:   B Natriuretic Peptide 967.3 (*)    All other components within normal limits  COMPREHENSIVE METABOLIC PANEL - Abnormal; Notable for the following components:   Glucose, Bld 343 (*)    Calcium 8.8 (*)    Albumin 2.8 (*)    Alkaline Phosphatase 129 (*)    All other components within normal limits    EKG EKG Interpretation  Date/Time:  Sunday Nov 15 2018 08:21:11 EDT Ventricular Rate:  100 PR Interval:    QRS Duration: 86 QT Interval:  347 QTC Calculation: 448 R Axis:   100 Text Interpretation:  Sinus arrhythmia Multiple premature complexes, vent & supraven Anterior infarct, old Nonspecific T abnormalities, lateral leads No significant change since last tracing Confirmed by Vanetta Mulders [phone]) on 11/15/2018 8:36:15 AM   Radiology Dg Chest Portable 1 View  Result Date: 11/15/2018 CLINICAL DATA:  Fluid in stomach and legs. EXAM: PORTABLE CHEST 1 VIEW COMPARISON:  Nov 13, 2018 FINDINGS: Stable cardiomegaly. Increased interstitial markings in the lungs consistent with pulmonary venous congestion. Atelectasis in the left mid lung. No other acute abnormalities. IMPRESSION: Cardiomegaly and  pulmonary venous congestion. Electronically Signed   By: Gerome Sam III M.D   On: 11/15/2018 09:11    Procedures Procedures (including critical care time)  Medications Ordered in ED Medications  furosemide (LASIX) tablet 40 mg (40 mg Oral Given 11/15/18 9450)     Initial Impression / Assessment and Plan / ED Course  I have reviewed the triage vital signs and the nursing notes.  Pertinent labs & imaging results that were available during my care of the patient were reviewed by me and considered in my medical decision making (see chart for details).  58 year old male presents with shortness of breath and bilateral leg pain.  This is his fifth emergency room visit in the last 5 days. He is afebrile, not in respiratory distress. His SpO2 is 98% on room air and he is talking in full sentences. Patient states his shortness of breath and edema has gotten worse.  He has not been taking his Lasix.  Given patient's complaint of worsening pain will initiate work-up with labs including CBC, CMP, troponin, BNP, EKG, chest x-ray.  On exam he has bilateral 2+ pitting edema of lower extremities.  Lung sounds are diminished throughout. Pt has peripheral edema at baseline, appears unchanged. The RN reports she had this patient x2 days ago and his edema appears to be unchanged.   Pt ambulated on pulse oximetry with stable SpO2 ranging from 93-96%  on room air. He ambulated without difficulty, no increasing dyspnea.  BNP today is 967, this is improved compared to 2 days ago when it was 1076.2. Troponin today is positive at 0.04, this appears to be his baseline.  Pt has chronically elevated troponin and it is unchanged today. CMP shows hyperglycemia of 343 with anion gap of 12. He does not appear to be in DKA but could be approaching with gap of 12. Potassium is within normal range, it is 3.7 today. When I informed pt of his elevated glucose his response is unless we plan to admit him pt the hospital he does not  want insulin. He can take it on his own after discharge. I discussed risks of hyperglycemia without treatment and pt became belligerent and continue to decline insulin. Pt is tolerating PO fluid intake and has had several cups of water. EKG viewed by me is unchanged from prior.  Chest x-ray shows pulmonary congestion versus cardiomegaly, no infiltrate seen. Pt given PO lasix and has had good urine output while in the ED.   Pt discussed with social work on call Joey who will fill out FL2 form for group home placement. Form signed by ED attending. Pt does not meet criteria for admission at this time, he will be discharged home with multiple resources while waiting for placement. I had lengthy discussion with pt on the importance of taking his home medications including Lasix and insulin. Also recommend he closely monitor blood sugar and close follow up with pcp or clinic. Again he became belligerent and states he just needs placement. Pt given copy of FL2 form by social work. I discussed strict ED return precautions. At discharge pt is in no acute distress, suspect his tachycardia of 102 at discharge was in the setting of anger and yelling. Pt case discussed with Dr. Deretha Emory who agrees with my plan.    This note was prepared with assistance of Conservation officer, historic buildings. Occasional wrong-word or sound-a-like substitutions may have occurred due to the inherent limitations of voice recognition software.      Final Clinical Impressions(s) / ED Diagnoses   Final diagnoses:  Peripheral edema  Hyperglycemia    ED Discharge Orders    None       Kathyrn Lass 11/15/18 2033    Vanetta Mulders, MD 11/16/18 [phone]

## 2018-11-15 NOTE — ED Triage Notes (Signed)
Pt arrives with [facility] c/o left leg swelling. Pt states he has fluid in his "stomach and legs." Pt states he is "trying to get in a group home but needs paperwork from SW."

## 2018-11-15 NOTE — Discharge Instructions (Addendum)
You have been seen today for leg swelling. Please read and follow all provided instructions. Return to the emergency room for worsening condition or new concerning symptoms.    We have filled out the FL-2 to help with group home placement. You received a copy of the form.  1. Medications:  Prescription- continue taking home medications including lasix. Take medications as prescribed. Please review all of the medicines and only take them if you do not have an allergy to them.   2. Treatment: rest, do not use drugs, take your medications as prescribed.  3. Follow Up: Please follow up with your primary doctor in 2-5 days for discussion of your diagnoses and further evaluation after today's visit; Call today to arrange your follow up.  If you do not have a primary care doctor use the resource guide provided to find one;   It is also a possibility that you have an allergic reaction to any of the medicines that you have been prescribed - Everybody reacts differently to medications and while MOST people have no trouble with most medicines, you may have a reaction such as nausea, vomiting, rash, swelling, shortness of breath. If this is the case, please stop taking the medicine immediately and contact your physician.  ?

## 2018-11-15 NOTE — ED Provider Notes (Signed)
[facility] EMERGENCY DEPARTMENT Provider Note   CSN: [id number] Arrival date & time: 11/15/18  1941    History   Chief Complaint Chief Complaint  Patient presents with  . Leg Swelling    HPI [name] is a 58 y.o. male. With homelessness, polysubstance abuse, CHF who presents with a c/c of peripheral edema and requesting placement. He has been seen here numerous times including every day this week and earlier today where he had labs drawn and 68 times in the past 6 months. He demands to be admitted so they can place him. He states he is unable to get in a group home. He endorses his edema has been going on for months. Also endorses using crack yesterday.     HPI  Past Medical History:  Diagnosis Date  . Chronic foot pain   . Cocaine abuse (HCC)   . Diabetes mellitus without complication (HCC)   . Hepatitis C    unsure   . Homelessness   . Hypertension   . Neuropathy   . Polysubstance abuse (HCC)   . Schizophrenia (HCC)   . Sleep apnea   . Systolic and diastolic CHF, chronic [facility])     Patient Active Problem List   Diagnosis Date Noted  . Pressure injury of skin 11/08/2018  . Systolic and diastolic CHF, acute on chronic (HCC) 11/07/2018  . Elevated troponin 10/18/2018  . HLD (hyperlipidemia) 10/18/2018  . Anxiety 10/18/2018  . CHF (congestive heart failure), NYHA class II, acute on chronic, combined (HCC) 10/18/2018  . Hypertensive urgency 10/12/2018  . Mild renal insufficiency 10/12/2018  . Cellulitis 10/12/2018  . Penile cellulitis   . Adjustment disorder with mixed disturbance of emotions and conduct   . Sleep apnea 10/03/2018  . Hypokalemia 09/17/2018  . Scrotal swelling 09/17/2018  . Urinary hesitancy   . Constipation   . Acute on chronic combined systolic and diastolic CHF (congestive heart failure) (HCC) 08/25/2018  . Acute respiratory failure with hypoxia (HCC) 08/25/2018  . Acute on chronic combined systolic and diastolic  congestive heart failure (HCC) 08/18/2018  . Tobacco use 08/18/2018  . 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 Homelessness 08/08/2018  . Smoker 08/08/2018  . Acute exacerbation of CHF (congestive heart failure) (HCC) 03/28/2018  . Prostate enlargement 03/16/2018  . Aortic atherosclerosis (HCC) 03/16/2018  . 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 Chronic foot pain   . Schizoaffective disorder, bipolar type (HCC) 09/30/2016  . Substance induced mood disorder (HCC) 03/13/2015  . Acute kidney failure (HCC) 01/26/2015  . Schizophrenia, paranoid type (HCC) 01/17/2015  . Drug hallucinosis (HCC) 10/08/2014  . Chronic paranoid schizophrenia (HCC) 09/07/2014  . 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 Urinary retention   . Cocaine use disorder, severe, dependence (HCC)   . Essential hypertension, benign 03/28/2013  . 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 MULTIPLE TOOTH EXTRACTIONS          Home Medications    Prior to Admission medications   Medication Sig Start Date End Date Taking? Authorizing Provider  ARIPiprazole (ABILIFY) 10 MG tablet Take 1 tablet (10 mg total) by mouth daily. Patient not taking: Reported on 11/13/2018 09/18/18   Meredeth Ide, MD  aspirin EC 81 MG tablet Take 1 tablet (81 mg total) by mouth daily.  Patient not taking: Reported on 11/13/2018 10/19/18 02/16/19  Meredeth Ide, MD  atorvastatin (LIPITOR) 40 MG tablet Take 1 tablet (40 mg total) by mouth daily. Patient not taking: Reported on 11/13/2018 09/18/18   Meredeth Ide, MD  bisacodyl (DULCOLAX) 5 MG EC tablet Take 1 tablet (5 mg total) by mouth daily as needed for moderate constipation. Patient not taking: Reported on 11/13/2018 09/04/18   Rodolph Bong, MD  carbamazepine  (TEGRETOL) 200 MG tablet Take 0.5 tablets (100 mg total) by mouth 2 (two) times daily. Patient not taking: Reported on 11/13/2018 09/18/18   Meredeth Ide, MD  carvedilol (COREG) 6.25 MG tablet Take 1 tablet (6.25 mg total) by mouth 2 (two) times daily with a meal. Patient not taking: Reported on 11/13/2018 10/19/18   Meredeth Ide, MD  cephALEXin (KEFLEX) 500 MG capsule Take 1 capsule (500 mg total) by mouth 4 (four) times daily. Patient not taking: Reported on 11/13/2018 10/22/18   Roxy Horseman, PA-C  furosemide (LASIX) 20 MG tablet Take 3 tablets (60 mg total) by mouth daily for 30 days. Patient not taking: Reported on 11/13/2018 11/11/18 12/11/18  Michela Pitcher A, PA-C  hydrocortisone (PROCTO-PAK) 1 % CREA Apply twice a day to hemorrhoids. Patient not taking: Reported on 11/13/2018 09/29/18   Renne Crigler, PA-C  Insulin Glargine (LANTUS) 100 UNIT/ML Solostar Pen Inject 14 Units into the skin daily. Patient not taking: Reported on 11/13/2018 09/18/18   Meredeth Ide, MD  lisinopril (PRINIVIL,ZESTRIL) 10 MG tablet Take 1 tablet (10 mg total) by mouth daily. Patient not taking: Reported on 11/13/2018 09/18/18   Meredeth Ide, MD  nicotine (NICODERM CQ - DOSED IN MG/24 HOURS) 21 mg/24hr patch Place 1 patch (21 mg total) onto the skin daily. Patient not taking: Reported on 11/13/2018 09/04/18   Rodolph Bong, MD  polyethylene glycol [facility] / [facility] Hal) packet Take 17 g by mouth 2 (two) times daily. Patient not taking: Reported on 11/13/2018 09/18/18   Meredeth Ide, MD  Potassium Chloride ER 20 MEQ TBCR Take 20 mEq by mouth daily. Patient not taking: Reported on 11/13/2018 09/18/18   Meredeth Ide, MD  senna-docusate (SENOKOT-S) 8.6-50 MG tablet Take 1 tablet by mouth 2 (two) times daily. Patient not taking: Reported on 11/13/2018 09/04/18   Rodolph Bong, MD  tamsulosin [facility]) 0.4 MG CAPS capsule Take 1 capsule (0.4 mg total) by mouth daily after breakfast. Patient not taking: Reported on 11/13/2018  10/05/18   Narda Bonds, MD    Family History Family History  Problem Relation Age of Onset  . Hypertension Other   . Diabetes Other     Social History Social History   Tobacco Use  . Smoking status: Current Every Day Smoker    Packs/day: 1.00    Years: 20.00    Pack years: 20.00    Types: Cigarettes  . Smokeless tobacco: Current User  Substance Use Topics  . Alcohol use: Yes    Comment: Daily Drinker   . Drug use: Yes    Frequency: 7.0 times per week    Types: "Crack" cocaine, Cocaine, Marijuana    Comment: used yesterday      Allergies   Haldol [haloperidol]   Review of Systems Review of Systems  Constitutional: Negative for chills and fever.  HENT: Negative for ear pain and sore throat.   Eyes: Negative for pain and visual disturbance.  Respiratory: Negative for cough and shortness of breath.   Cardiovascular: Positive  for leg swelling. Negative for chest pain and palpitations.  Gastrointestinal: Negative for abdominal pain, nausea and vomiting.  Genitourinary: Positive for scrotal swelling. Negative for dysuria and hematuria.  Musculoskeletal: Negative for arthralgias and back pain.  Skin: Negative for color change and rash.  Neurological: Negative for seizures and syncope.  All other systems reviewed and are negative.    Physical Exam Updated Vital Signs BP (!) 162/98   Pulse (!) 103   Temp 99.8 F (37.7 C) (Oral)   Resp (!) 23   SpO2 99%   Physical Exam Vitals signs and nursing note reviewed.  Constitutional:      General: He is not in acute distress.    Appearance: He is well-developed. He is not ill-appearing, toxic-appearing or diaphoretic.  HENT:     Head: Normocephalic and atraumatic.  Eyes:     Conjunctiva/sclera: Conjunctivae normal.  Neck:     Musculoskeletal: Neck supple.  Cardiovascular:     Rate and Rhythm: Normal rate and regular rhythm.     Heart sounds: No murmur.  Pulmonary:     Effort: Pulmonary effort is normal. No  respiratory distress.     Breath sounds: Normal breath sounds.  Abdominal:     Palpations: Abdomen is soft.     Tenderness: There is no abdominal tenderness.  Musculoskeletal:     Right lower leg: Edema present.     Left lower leg: Edema present.     Comments: 2+ pitting edema in bilateral LE and scrotum.  Skin:    General: Skin is warm and dry.  Neurological:     Mental Status: He is alert.      ED Treatments / Results  Labs (all labs ordered are listed, but only abnormal results are displayed) Labs Reviewed - No data to display  EKG EKG Interpretation  Date/Time:  Sunday Nov 15 2018 19:52:27 EDT Ventricular Rate:  102 PR Interval:    QRS Duration: 96 QT Interval:  336 QTC Calculation: 438 R Axis:   34 Text Interpretation:  Sinus tachycardia Anterior infarct, old Confirmed by Blane Ohara [phone]) on 11/15/2018 8:38:58 PM   Radiology Dg Chest Portable 1 View  Result Date: 11/15/2018 CLINICAL DATA:  Fluid in stomach and legs. EXAM: PORTABLE CHEST 1 VIEW COMPARISON:  Nov 13, 2018 FINDINGS: Stable cardiomegaly. Increased interstitial markings in the lungs consistent with pulmonary venous congestion. Atelectasis in the left mid lung. No other acute abnormalities. IMPRESSION: Cardiomegaly and pulmonary venous congestion. Electronically Signed   By: Gerome Sam III M.D   On: 11/15/2018 09:11    Procedures Procedures (including critical care time)  Medications Ordered in ED Medications  furosemide (LASIX) tablet 60 mg (60 mg Oral Given 11/15/18 2045)     Initial Impression / Assessment and Plan / ED Course  I have reviewed the triage vital signs and the nursing notes.  Pertinent labs & imaging results that were available during my care of the patient were reviewed by me and considered in my medical decision making (see chart for details).        [name] is a 58 y.o. male. With homelessness, polysubstance abuse, CHF who presents with a c/c of  peripheral edema and requesting placement. He has been seen here numerous times including every day this week and earlier today where he had labs drawn and 68 times in the past 6 months  Labs reviewed from earlier today. BNP elevated but improved from 2 days ago. CXR showed no significant edema. Do  not feel work up is required.   Do not feel patient requires admission. He has information from social work given earlier today. Advised patient to follow up with the information. Patient discharged.  Final Clinical Impressions(s) / ED Diagnoses   Final diagnoses:  Peripheral edema  Leg swelling    ED Discharge Orders    None       Dicky DoeFord, Verlie Liotta, MD 11/16/18 1324    Blane OharaZavitz, Joshua, MD 11/17/18 1529

## 2018-11-15 NOTE — ED Notes (Signed)
This RN ambulated patient in hall with pulse ox monitoring; pt O2 sat 93-96%.

## 2018-11-15 NOTE — ED Notes (Signed)
Patient verbalizes understanding of discharge instructions. Opportunity for questioning and answers were provided. Armband removed by staff, pt discharged from ED with security and [employer].

## 2018-11-15 NOTE — ED Notes (Signed)
Critical value received from [place] in lab; troponin 0.04. ED provider notified.

## 2018-11-15 NOTE — ED Notes (Signed)
ED Provider at bedside. 

## 2018-11-15 NOTE — ED Triage Notes (Signed)
Pt c/o bilateral leg swelling, abdominal and scrotal swelling.

## 2018-11-16 ENCOUNTER — Encounter (HOSPITAL_COMMUNITY): Payer: Self-pay | Admitting: Emergency Medicine

## 2018-11-16 ENCOUNTER — Emergency Department (HOSPITAL_COMMUNITY)
Admission: EM | Admit: 2018-11-16 | Discharge: 2018-11-16 | Disposition: A | Discharge status: Home / Self Care | Payer: Medicare Other | Source: Home / Self Care | Attending: Emergency Medicine | Admitting: Emergency Medicine

## 2018-11-16 ENCOUNTER — Other Ambulatory Visit: Payer: Self-pay

## 2018-11-16 DIAGNOSIS — Z5900 Homelessness unspecified: Secondary | ICD-10-CM

## 2018-11-16 DIAGNOSIS — I5042 Chronic combined systolic (congestive) and diastolic (congestive) heart failure: Secondary | ICD-10-CM | POA: Insufficient documentation

## 2018-11-16 DIAGNOSIS — F17210 Nicotine dependence, cigarettes, uncomplicated: Secondary | ICD-10-CM | POA: Insufficient documentation

## 2018-11-16 DIAGNOSIS — E119 Type 2 diabetes mellitus without complications: Secondary | ICD-10-CM | POA: Insufficient documentation

## 2018-11-16 DIAGNOSIS — K649 Unspecified hemorrhoids: Secondary | ICD-10-CM | POA: Diagnosis not present

## 2018-11-16 DIAGNOSIS — I110 Hypertensive heart disease with heart failure: Secondary | ICD-10-CM | POA: Diagnosis not present

## 2018-11-16 DIAGNOSIS — Z79899 Other long term (current) drug therapy: Secondary | ICD-10-CM | POA: Insufficient documentation

## 2018-11-16 DIAGNOSIS — Z7982 Long term (current) use of aspirin: Secondary | ICD-10-CM | POA: Insufficient documentation

## 2018-11-16 DIAGNOSIS — Z794 Long term (current) use of insulin: Secondary | ICD-10-CM | POA: Insufficient documentation

## 2018-11-16 NOTE — ED Notes (Addendum)
11/16/18 0030- Pt sitting out in front of ED on bench and has had bowel and urinary incontinence.  He took his paper scrubs off and is sitting on bench smoking a cigarette.  Off duty [employer] has attempted to call pt's ride.  Pt states he was incontinent due to the lasix he received.  EMT x 2 took pt to decon shower and off-duty [employer] officer will then call for a [employer] officer to pick pt up and take where he wants to go.  Pt is homeless and well known to ED.  Multiple attempts to give pt resources have failed.

## 2018-11-16 NOTE — Discharge Instructions (Addendum)
Please follow-up at [facility] health and wellness for any chronic issues

## 2018-11-16 NOTE — ED Triage Notes (Signed)
Pt presents today for hemorrhoid pain.

## 2018-11-16 NOTE — ED Triage Notes (Signed)
EMS bp 170/90 , hr 12,  o2 % 95 , cbg 179

## 2018-11-16 NOTE — ED Notes (Signed)
Pt was verbally abusive toward staff at discharge. Security at bedside to escort pt outside.

## 2018-11-16 NOTE — ED Triage Notes (Signed)
Pt. Stated, I have hemorrhoids as big as balls.started today

## 2018-11-16 NOTE — ED Provider Notes (Signed)
[facility] EMERGENCY DEPARTMENT Provider Note   CSN: [id number] Arrival date & time: 11/16/18  1046    History   Chief Complaint Chief Complaint  Patient presents with  . Hemorrhoids    HPI [name] is a 57 y.o. male.     HPI   58 year old male here in the emergency room requesting placement.  Patient notes that he is homeless and would like to be placed in a care home.  He denies any complaints of hemorrhoids as noted in the chief complaint.  Patient has numerous chronic medical conditions but is focused on finding placement.  He has been seen 69 times in the last 6 months.  Past Medical History:  Diagnosis Date  . Chronic foot pain   . Cocaine abuse (HCC)   . Diabetes mellitus without complication (HCC)   . Hepatitis C    unsure   . Homelessness   . Hypertension   . Neuropathy   . Polysubstance abuse (HCC)   . Schizophrenia (HCC)   . Sleep apnea   . Systolic and diastolic CHF, chronic [facility])     Patient Active Problem List   Diagnosis Date Noted  . Pressure injury of skin 11/08/2018  . Systolic and diastolic CHF, acute on chronic (HCC) 11/07/2018  . Elevated troponin 10/18/2018  . HLD (hyperlipidemia) 10/18/2018  . Anxiety 10/18/2018  . CHF (congestive heart failure), NYHA class II, acute on chronic, combined (HCC) 10/18/2018  . Hypertensive urgency 10/12/2018  . Mild renal insufficiency 10/12/2018  . Cellulitis 10/12/2018  . Penile cellulitis   . Adjustment disorder with mixed disturbance of emotions and conduct   . Sleep apnea 10/03/2018  . Hypokalemia 09/17/2018  . Scrotal swelling 09/17/2018  . Urinary hesitancy   . Constipation   . Acute on chronic combined systolic and diastolic CHF (congestive heart failure) (HCC) 08/25/2018  . Acute respiratory failure with hypoxia (HCC) 08/25/2018  . Acute on chronic combined systolic and diastolic congestive heart failure (HCC) 08/18/2018  . Tobacco use 08/18/2018  . Acute on  chronic combined systolic (congestive) and diastolic (congestive) heart failure (HCC) 08/08/2018  . Homelessness 08/08/2018  . Smoker 08/08/2018  . Acute exacerbation of CHF (congestive heart failure) (HCC) 03/28/2018  . Prostate enlargement 03/16/2018  . Aortic atherosclerosis (HCC) 03/16/2018  . 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 Chronic foot pain   . Schizoaffective disorder, bipolar type (HCC) 09/30/2016  . Substance induced mood disorder (HCC) 03/13/2015  . Acute kidney failure (HCC) 01/26/2015  . Schizophrenia, paranoid type (HCC) 01/17/2015  . Drug hallucinosis (HCC) 10/08/2014  . Chronic paranoid schizophrenia (HCC) 09/07/2014  . 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 Urinary retention   . Cocaine use disorder, severe, dependence (HCC)   . Essential hypertension, benign 03/28/2013  . 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 MULTIPLE TOOTH EXTRACTIONS          Home Medications    Prior to Admission medications   Medication Sig Start Date End Date Taking? Authorizing Provider  ARIPiprazole (ABILIFY) 10 MG tablet Take 1 tablet (10 mg total) by mouth daily. Patient not taking: Reported on 11/13/2018 09/18/18   Meredeth Ide, MD  aspirin EC 81 MG tablet Take 1 tablet (81 mg total) by mouth daily. Patient not taking: Reported on 11/13/2018 10/19/18 02/16/19  Meredeth Ide, MD  atorvastatin (  LIPITOR) 40 MG tablet Take 1 tablet (40 mg total) by mouth daily. Patient not taking: Reported on 11/13/2018 09/18/18   Meredeth Ide, MD  bisacodyl (DULCOLAX) 5 MG EC tablet Take 1 tablet (5 mg total) by mouth daily as needed for moderate constipation. Patient not taking: Reported on 11/13/2018 09/04/18   Rodolph Bong, MD  carbamazepine (TEGRETOL) 200 MG tablet Take 0.5 tablets (100 mg total) by mouth 2 (two) times  daily. Patient not taking: Reported on 11/13/2018 09/18/18   Meredeth Ide, MD  carvedilol (COREG) 6.25 MG tablet Take 1 tablet (6.25 mg total) by mouth 2 (two) times daily with a meal. Patient not taking: Reported on 11/13/2018 10/19/18   Meredeth Ide, MD  cephALEXin (KEFLEX) 500 MG capsule Take 1 capsule (500 mg total) by mouth 4 (four) times daily. Patient not taking: Reported on 11/13/2018 10/22/18   Roxy Horseman, PA-C  furosemide (LASIX) 20 MG tablet Take 3 tablets (60 mg total) by mouth daily for 30 days. Patient not taking: Reported on 11/13/2018 11/11/18 12/11/18  Michela Pitcher A, PA-C  hydrocortisone (PROCTO-PAK) 1 % CREA Apply twice a day to hemorrhoids. Patient not taking: Reported on 11/13/2018 09/29/18   Renne Crigler, PA-C  Insulin Glargine (LANTUS) 100 UNIT/ML Solostar Pen Inject 14 Units into the skin daily. Patient not taking: Reported on 11/13/2018 09/18/18   Meredeth Ide, MD  lisinopril (PRINIVIL,ZESTRIL) 10 MG tablet Take 1 tablet (10 mg total) by mouth daily. Patient not taking: Reported on 11/13/2018 09/18/18   Meredeth Ide, MD  nicotine (NICODERM CQ - DOSED IN MG/24 HOURS) 21 mg/24hr patch Place 1 patch (21 mg total) onto the skin daily. Patient not taking: Reported on 11/13/2018 09/04/18   Rodolph Bong, MD  polyethylene glycol [facility] / [facility]) packet Take 17 g by mouth 2 (two) times daily. Patient not taking: Reported on 11/13/2018 09/18/18   Meredeth Ide, MD  Potassium Chloride ER 20 MEQ TBCR Take 20 mEq by mouth daily. Patient not taking: Reported on 11/13/2018 09/18/18   Meredeth Ide, MD  senna-docusate (SENOKOT-S) 8.6-50 MG tablet Take 1 tablet by mouth 2 (two) times daily. Patient not taking: Reported on 11/13/2018 09/04/18   Rodolph Bong, MD  tamsulosin [facility]) 0.4 MG CAPS capsule Take 1 capsule (0.4 mg total) by mouth daily after breakfast. Patient not taking: Reported on 11/13/2018 10/05/18   Narda Bonds, MD    Family History Family History  Problem  Relation Age of Onset  . Hypertension Other   . Diabetes Other     Social History Social History   Tobacco Use  . Smoking status: Current Every Day Smoker    Packs/day: 1.00    Years: 20.00    Pack years: 20.00    Types: Cigarettes  . Smokeless tobacco: Current User  Substance Use Topics  . Alcohol use: Yes    Comment: Daily Drinker   . Drug use: Yes    Frequency: 7.0 times per week    Types: "Crack" cocaine, Cocaine, Marijuana    Comment: used yesterday      Allergies   Haldol [haloperidol]   Review of Systems Review of Systems  All other systems reviewed and are negative.    Physical Exam Updated Vital Signs BP (!) 158/102 (BP Location: Left Arm)   Pulse (!) 105   Temp 98.4 F (36.9 C) (Oral)   Resp 20   Ht 5\' 9"  (1.753 m)   SpO2 93%  BMI 30.72 kg/m   Physical Exam Vitals signs and nursing note reviewed.  Constitutional:      Appearance: He is well-developed.  HENT:     Head: Normocephalic and atraumatic.  Eyes:     General: No scleral icterus.       Right eye: No discharge.        Left eye: No discharge.     Conjunctiva/sclera: Conjunctivae normal.     Pupils: Pupils are equal, round, and reactive to light.  Neck:     Musculoskeletal: Normal range of motion.     Vascular: No JVD.     Trachea: No tracheal deviation.  Pulmonary:     Effort: Pulmonary effort is normal.     Breath sounds: No stridor.  Neurological:     Mental Status: He is alert and oriented to person, place, and time.     Coordination: Coordination normal.  Psychiatric:        Behavior: Behavior normal.        Thought Content: Thought content normal.        Judgment: Judgment normal.      ED Treatments / Results  Labs (all labs ordered are listed, but only abnormal results are displayed) Labs Reviewed - No data to display  EKG None  Radiology Dg Chest Portable 1 View  Result Date: 11/15/2018 CLINICAL DATA:  Fluid in stomach and legs. EXAM: PORTABLE CHEST 1 VIEW  COMPARISON:  Nov 13, 2018 FINDINGS: Stable cardiomegaly. Increased interstitial markings in the lungs consistent with pulmonary venous congestion. Atelectasis in the left mid lung. No other acute abnormalities. IMPRESSION: Cardiomegaly and pulmonary venous congestion. Electronically Signed   By: Gerome Samavid  Williams III M.D   On: 11/15/2018 09:11    Procedures Procedures (including critical care time)  Medications Ordered in ED Medications - No data to display   Initial Impression / Assessment and Plan / ED Course  I have reviewed the triage vital signs and the nursing notes.  Pertinent labs & imaging results that were available during my care of the patient were reviewed by me and considered in my medical decision making (see chart for details).        58 year old male here for social issues.  He has been seen numerous times has had thorough social work evaluation and assistance.  We are unable to provide any further services for him at this time.  No acute medical concerns at this visit.  Final Clinical Impressions(s) / ED Diagnoses   Final diagnoses:  Homelessness    ED Discharge Orders    None       Rosalio LoudHedges, Erinn Mendosa, PA-C 11/16/18 1122    Raeford RazorKohut, Stephen, MD 11/16/18 1201

## 2018-11-16 NOTE — ED Notes (Signed)
Patient verbalizes understanding of discharge instructions. Opportunity for questioning and answers were provided. Armband removed by staff, pt discharged from ED via wheelchair to home.  

## 2018-11-17 ENCOUNTER — Emergency Department (HOSPITAL_COMMUNITY): Payer: Medicare Other

## 2018-11-17 ENCOUNTER — Other Ambulatory Visit: Payer: Self-pay

## 2018-11-17 ENCOUNTER — Inpatient Hospital Stay (HOSPITAL_COMMUNITY)
Admission: EM | Admit: 2018-11-17 | Discharge: 2018-11-20 | DRG: 292 | Discharge status: Against Medical Advice | Payer: Medicare Other | Attending: Internal Medicine | Admitting: Internal Medicine

## 2018-11-17 DIAGNOSIS — Z1159 Encounter for screening for other viral diseases: Secondary | ICD-10-CM

## 2018-11-17 DIAGNOSIS — Z794 Long term (current) use of insulin: Secondary | ICD-10-CM | POA: Diagnosis not present

## 2018-11-17 DIAGNOSIS — D649 Anemia, unspecified: Secondary | ICD-10-CM | POA: Diagnosis present

## 2018-11-17 DIAGNOSIS — I5033 Acute on chronic diastolic (congestive) heart failure: Secondary | ICD-10-CM

## 2018-11-17 DIAGNOSIS — Z7982 Long term (current) use of aspirin: Secondary | ICD-10-CM

## 2018-11-17 DIAGNOSIS — R0902 Hypoxemia: Secondary | ICD-10-CM | POA: Diagnosis present

## 2018-11-17 DIAGNOSIS — I110 Hypertensive heart disease with heart failure: Principal | ICD-10-CM | POA: Diagnosis present

## 2018-11-17 DIAGNOSIS — E119 Type 2 diabetes mellitus without complications: Secondary | ICD-10-CM | POA: Diagnosis not present

## 2018-11-17 DIAGNOSIS — N5089 Other specified disorders of the male genital organs: Secondary | ICD-10-CM | POA: Diagnosis present

## 2018-11-17 DIAGNOSIS — Z6830 Body mass index (BMI) 30.0-30.9, adult: Secondary | ICD-10-CM

## 2018-11-17 DIAGNOSIS — Z888 Allergy status to other drugs, medicaments and biological substances status: Secondary | ICD-10-CM | POA: Diagnosis not present

## 2018-11-17 DIAGNOSIS — I472 Ventricular tachycardia: Secondary | ICD-10-CM

## 2018-11-17 DIAGNOSIS — B1920 Unspecified viral hepatitis C without hepatic coma: Secondary | ICD-10-CM | POA: Diagnosis present

## 2018-11-17 DIAGNOSIS — G4733 Obstructive sleep apnea (adult) (pediatric): Secondary | ICD-10-CM | POA: Diagnosis present

## 2018-11-17 DIAGNOSIS — Z79899 Other long term (current) drug therapy: Secondary | ICD-10-CM

## 2018-11-17 DIAGNOSIS — I4729 Other ventricular tachycardia: Secondary | ICD-10-CM

## 2018-11-17 DIAGNOSIS — F1410 Cocaine abuse, uncomplicated: Secondary | ICD-10-CM | POA: Diagnosis present

## 2018-11-17 DIAGNOSIS — R601 Generalized edema: Secondary | ICD-10-CM

## 2018-11-17 DIAGNOSIS — L89152 Pressure ulcer of sacral region, stage 2: Secondary | ICD-10-CM | POA: Diagnosis present

## 2018-11-17 DIAGNOSIS — F17210 Nicotine dependence, cigarettes, uncomplicated: Secondary | ICD-10-CM | POA: Diagnosis present

## 2018-11-17 DIAGNOSIS — F200 Paranoid schizophrenia: Secondary | ICD-10-CM | POA: Diagnosis present

## 2018-11-17 DIAGNOSIS — Z5900 Homelessness unspecified: Secondary | ICD-10-CM

## 2018-11-17 DIAGNOSIS — Z720 Tobacco use: Secondary | ICD-10-CM | POA: Diagnosis not present

## 2018-11-17 DIAGNOSIS — Z9119 Patient's noncompliance with other medical treatment and regimen: Secondary | ICD-10-CM | POA: Diagnosis not present

## 2018-11-17 DIAGNOSIS — E7800 Pure hypercholesterolemia, unspecified: Secondary | ICD-10-CM | POA: Diagnosis not present

## 2018-11-17 DIAGNOSIS — F1420 Cocaine dependence, uncomplicated: Secondary | ICD-10-CM | POA: Diagnosis present

## 2018-11-17 DIAGNOSIS — E785 Hyperlipidemia, unspecified: Secondary | ICD-10-CM | POA: Diagnosis present

## 2018-11-17 DIAGNOSIS — I10 Essential (primary) hypertension: Secondary | ICD-10-CM | POA: Diagnosis not present

## 2018-11-17 DIAGNOSIS — E1165 Type 2 diabetes mellitus with hyperglycemia: Secondary | ICD-10-CM | POA: Diagnosis present

## 2018-11-17 DIAGNOSIS — Z5329 Procedure and treatment not carried out because of patient's decision for other reasons: Secondary | ICD-10-CM | POA: Diagnosis present

## 2018-11-17 DIAGNOSIS — Z66 Do not resuscitate: Secondary | ICD-10-CM | POA: Diagnosis present

## 2018-11-17 DIAGNOSIS — I5043 Acute on chronic combined systolic (congestive) and diastolic (congestive) heart failure: Secondary | ICD-10-CM | POA: Diagnosis present

## 2018-11-17 DIAGNOSIS — K5900 Constipation, unspecified: Secondary | ICD-10-CM | POA: Diagnosis present

## 2018-11-17 DIAGNOSIS — Z833 Family history of diabetes mellitus: Secondary | ICD-10-CM

## 2018-11-17 DIAGNOSIS — R739 Hyperglycemia, unspecified: Secondary | ICD-10-CM

## 2018-11-17 DIAGNOSIS — Z8249 Family history of ischemic heart disease and other diseases of the circulatory system: Secondary | ICD-10-CM

## 2018-11-17 DIAGNOSIS — G8929 Other chronic pain: Secondary | ICD-10-CM | POA: Diagnosis present

## 2018-11-17 DIAGNOSIS — K649 Unspecified hemorrhoids: Secondary | ICD-10-CM | POA: Diagnosis present

## 2018-11-17 LAB — CBC
HCT: 34.4 % — ABNORMAL LOW (ref 39.0–52.0)
Hemoglobin: 10 g/dL — ABNORMAL LOW (ref 13.0–17.0)
MCH: 24 pg — ABNORMAL LOW (ref 26.0–34.0)
MCHC: 29.1 g/dL — ABNORMAL LOW (ref 30.0–36.0)
MCV: 82.5 fL (ref 80.0–100.0)
Platelets: 284 10*3/uL (ref 150–400)
RBC: 4.17 MIL/uL — ABNORMAL LOW (ref 4.22–5.81)
RDW: 20.2 % — ABNORMAL HIGH (ref 11.5–15.5)
WBC: 10 10*3/uL (ref 4.0–10.5)
nRBC: 0.2 % (ref 0.0–0.2)

## 2018-11-17 LAB — URINALYSIS, ROUTINE W REFLEX MICROSCOPIC
Bilirubin Urine: NEGATIVE
Glucose, UA: >=500 mg/dL — AB
Hgb urine dipstick: NEGATIVE
Ketones, ur: NEGATIVE mg/dL
Leukocytes,Ua: NEGATIVE
Nitrite: NEGATIVE
Protein, ur: 100 mg/dL — AB
Specific Gravity, Urine: 1.023 (ref 1.005–1.030)
pH: 5 (ref 5.0–8.0)

## 2018-11-17 LAB — BASIC METABOLIC PANEL
Anion gap: 13 (ref 5–15)
BUN: 17 mg/dL (ref 6–20)
CO2: 26 mmol/L (ref 22–32)
Calcium: 9 mg/dL (ref 8.9–10.3)
Chloride: 100 mmol/L (ref 98–111)
Creatinine, Ser: 1.18 mg/dL (ref 0.61–1.24)
GFR calc Af Amer: >60 mL/min (ref >60)
GFR calc non Af Amer: >60 mL/min (ref >60)
Glucose, Bld: 400 mg/dL — ABNORMAL HIGH (ref 70–99)
Potassium: 3.7 mmol/L (ref 3.5–5.1)
Sodium: 139 mmol/L (ref 135–145)

## 2018-11-17 LAB — CBG MONITORING, ED
Glucose-Capillary: 333 mg/dL — ABNORMAL HIGH (ref 70–99)
Glucose-Capillary: 326 mg/dL — ABNORMAL HIGH (ref 70–99)

## 2018-11-17 LAB — MAGNESIUM: Magnesium: 2 mg/dL (ref 1.7–2.4)

## 2018-11-17 LAB — SARS CORONAVIRUS 2 BY RT PCR (HOSPITAL ORDER, PERFORMED IN ~~LOC~~ HOSPITAL LAB): SARS Coronavirus 2: NEGATIVE

## 2018-11-17 LAB — GLUCOSE, CAPILLARY
Glucose-Capillary: 252 mg/dL — ABNORMAL HIGH (ref 70–99)
Glucose-Capillary: 114 mg/dL — ABNORMAL HIGH (ref 70–99)

## 2018-11-17 LAB — TROPONIN I: Troponin I: 0.04 ng/mL (ref <0.03)

## 2018-11-17 LAB — BRAIN NATRIURETIC PEPTIDE: B Natriuretic Peptide: 1165.8 pg/mL — ABNORMAL HIGH (ref 0.0–100.0)

## 2018-11-17 MED ORDER — INSULIN ASPART 100 UNIT/ML ~~LOC~~ SOLN: 0.0000 [IU] | Freq: Every day | SUBCUTANEOUS | Status: DC

## 2018-11-17 MED ORDER — POTASSIUM CHLORIDE 10 MEQ/100ML IV SOLN
10.0000 meq | Freq: Once | INTRAVENOUS | Status: AC
  Administered 2018-11-17: 10 meq via INTRAVENOUS
  Filled 2018-11-17: qty 100

## 2018-11-17 MED ORDER — NICOTINE 21 MG/24HR TD PT24
21.0000 mg | MEDICATED_PATCH | Freq: Every day | TRANSDERMAL | Status: DC
  Administered 2018-11-17 – 2018-11-20 (×4): 21 mg via TRANSDERMAL
  Filled 2018-11-17 (×4): qty 1

## 2018-11-17 MED ORDER — ACETAMINOPHEN 325 MG PO TABS
650.0000 mg | ORAL_TABLET | Freq: Once | ORAL | Status: AC
  Administered 2018-11-17: 07:00:00 650 mg via ORAL
  Filled 2018-11-17: qty 2

## 2018-11-17 MED ORDER — ACETAMINOPHEN 325 MG PO TABS
650.0000 mg | ORAL_TABLET | ORAL | Status: DC | PRN
  Administered 2018-11-17 – 2018-11-20 (×6): 650 mg via ORAL
  Filled 2018-11-17 (×6): qty 2

## 2018-11-17 MED ORDER — FUROSEMIDE 10 MG/ML IJ SOLN
40.0000 mg | Freq: Once | INTRAMUSCULAR | Status: AC
  Administered 2018-11-17: 40 mg via INTRAVENOUS
  Filled 2018-11-17: qty 4

## 2018-11-17 MED ORDER — MAGNESIUM SULFATE 2 GM/50ML IV SOLN
2.0000 g | Freq: Once | INTRAVENOUS | Status: AC
  Administered 2018-11-17: 08:00:00 2 g via INTRAVENOUS
  Filled 2018-11-17: qty 50

## 2018-11-17 MED ORDER — SODIUM CHLORIDE 0.9% FLUSH: 3.0000 mL | INTRAVENOUS | Status: DC | PRN

## 2018-11-17 MED ORDER — ONDANSETRON HCL 4 MG/2ML IJ SOLN: 4.0000 mg | Freq: Four times a day (QID) | INTRAMUSCULAR | Status: DC | PRN

## 2018-11-17 MED ORDER — POTASSIUM CHLORIDE CRYS ER 20 MEQ PO TBCR
40.0000 meq | EXTENDED_RELEASE_TABLET | Freq: Once | ORAL | Status: AC
  Administered 2018-11-17: 40 meq via ORAL
  Filled 2018-11-17: qty 2

## 2018-11-17 MED ORDER — SODIUM CHLORIDE 0.9 % IV SOLN: 250.0000 mL | INTRAVENOUS | Status: DC | PRN

## 2018-11-17 MED ORDER — FUROSEMIDE 10 MG/ML IJ SOLN
40.0000 mg | Freq: Three times a day (TID) | INTRAMUSCULAR | Status: DC
  Administered 2018-11-17 – 2018-11-20 (×10): 40 mg via INTRAVENOUS
  Filled 2018-11-17 (×10): qty 4

## 2018-11-17 MED ORDER — SODIUM CHLORIDE 0.9% FLUSH
3.0000 mL | Freq: Two times a day (BID) | INTRAVENOUS | Status: DC
  Administered 2018-11-17 – 2018-11-20 (×7): 3 mL via INTRAVENOUS

## 2018-11-17 MED ORDER — ENOXAPARIN SODIUM 40 MG/0.4ML ~~LOC~~ SOLN
40.0000 mg | SUBCUTANEOUS | Status: DC
  Administered 2018-11-17 – 2018-11-20 (×4): 40 mg via SUBCUTANEOUS
  Filled 2018-11-17 (×4): qty 0.4

## 2018-11-17 MED ORDER — INSULIN ASPART 100 UNIT/ML ~~LOC~~ SOLN
0.0000 [IU] | Freq: Three times a day (TID) | SUBCUTANEOUS | Status: DC
  Administered 2018-11-17: 11 [IU] via SUBCUTANEOUS
  Administered 2018-11-17: 8 [IU] via SUBCUTANEOUS
  Administered 2018-11-18: 5 [IU] via SUBCUTANEOUS
  Administered 2018-11-18 (×2): 3 [IU] via SUBCUTANEOUS
  Administered 2018-11-19: 5 [IU] via SUBCUTANEOUS
  Administered 2018-11-19: 13:00:00 3 [IU] via SUBCUTANEOUS
  Administered 2018-11-19: 5 [IU] via SUBCUTANEOUS
  Administered 2018-11-20: 3 [IU] via SUBCUTANEOUS
  Administered 2018-11-20: 5 [IU] via SUBCUTANEOUS
  Administered 2018-11-20: 8 [IU] via SUBCUTANEOUS

## 2018-11-17 NOTE — Progress Notes (Signed)
Patient and Care Team Partnership Agreement signed with patient, MD, and security at the bedside. Patient informed to maintain appropriate behavior while hospitalized. Patient is aware of consequences of not following appropriate behavior. Nursing staff will continue to follow orders written by MD. Patient is aware of his fluid restriction and fluid intake is written on a board in his room.

## 2018-11-17 NOTE — Progress Notes (Signed)
PT Cancellation Note  Patient Details Name: [name] MRN: [id number] DOB: 1961-06-24   Cancelled Treatment:    Reason Eval/Treat Not Completed: Fatigue/lethargy limiting ability to participate. Per RN, pt remains lethargic. Will follow-up for PT evaluation as schedule permits.  Ina Homes, PT, DPT Acute Rehabilitation Services  Pager [phone] Office [phone]  Malachy Chamber 11/17/2018, 12:37 PM

## 2018-11-17 NOTE — ED Notes (Signed)
Pt arrived to Rm 53 via stretcher.

## 2018-11-17 NOTE — ED Provider Notes (Signed)
[facility] EM[facility]ENCY [facility] Provider Note   CSN: [id number] Arrival date & time: 11/17/18  0604    History   Chief Complaint Chief Complaint  Patient presents with  . Hyperglycemia    HPI [name] is a 58 y.o. male.   The history is provided by the patient. The history is limited by the condition of the patient (Psychiatric disorder).  He has history of hypertension diabetes, hyperlipidemia, systolic and diastolic heart failure, schizophrenia, substance abuse, homelessness and comes in stating that he has to pee and that his legs and abdomen are tight.  After that, he starts making random statements with no coherent thought and cannot be directed to answer anything further.  Past Medical History:  Diagnosis Date  . Chronic foot pain   . Cocaine abuse (HCC)   . Diabetes mellitus without complication (HCC)   . Hepatitis C    unsure   . Homelessness   . Hypertension   . Neuropathy   . Polysubstance abuse (HCC)   . Schizophrenia (HCC)   . Sleep apnea   . Systolic and diastolic CHF, chronic [facility](HCC)     Patient Active Problem List   Diagnosis Date Noted  . Pressure injury of skin 11/08/2018  . Systolic and diastolic CHF, acute on chronic (HCC) 11/07/2018  . Elevated troponin 10/18/2018  . HLD (hyperlipidemia) 10/18/2018  . Anxiety 10/18/2018  . CHF (congestive heart failure), NYHA class II, acute on chronic, combined (HCC) 10/18/2018  . Hypertensive urgency 10/12/2018  . Mild renal insufficiency 10/12/2018  . Cellulitis 10/12/2018  . Penile cellulitis   . Adjustment disorder with mixed disturbance of emotions and conduct   . Sleep apnea 10/03/2018  . Hypokalemia 09/17/2018  . Scrotal swelling 09/17/2018  . Urinary hesitancy   . Constipation   . Acute on chronic combined systolic and diastolic CHF (congestive heart failure) (HCC) 08/25/2018  . Acute respiratory failure with hypoxia (HCC) 08/25/2018  . Acute on chronic combined  systolic and diastolic congestive heart failure (HCC) 08/18/2018  . Tobacco use 08/18/2018  . 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 Homelessness 08/08/2018  . Smoker 08/08/2018  . Acute exacerbation of CHF (congestive heart failure) (HCC) 03/28/2018  . Prostate enlargement 03/16/2018  . Aortic atherosclerosis (HCC) 03/16/2018  . 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 Chronic foot pain   . Schizoaffective disorder, bipolar type (HCC) 09/30/2016  . Substance induced mood disorder (HCC) 03/13/2015  . Acute kidney failure (HCC) 01/26/2015  . Schizophrenia, paranoid type (HCC) 01/17/2015  . Drug hallucinosis (HCC) 10/08/2014  . Chronic paranoid schizophrenia (HCC) 09/07/2014  . 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 Urinary retention   . Cocaine use disorder, severe, dependence (HCC)   . Essential hypertension, benign 03/28/2013  . 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 MULTIPLE TOOTH EXTRACTIONS          Home Medications    Prior to Admission medications   Medication Sig Start Date End Date Taking? Authorizing Provider  ARIPiprazole (ABILIFY) 10 MG tablet Take 1 tablet (10 mg total) by mouth daily. Patient not taking: Reported on 11/13/2018 09/18/18   Meredeth IdeLama, Gagan S, MD  aspirin EC 81 MG tablet Take 1 tablet (81 mg total) by mouth daily. Patient not taking: Reported on 11/13/2018 10/19/18 02/16/19  Mauro KaufmannLama, Gagan  S, MD  atorvastatin (LIPITOR) 40 MG tablet Take 1 tablet (40 mg total) by mouth daily. Patient not taking: Reported on 11/13/2018 09/18/18   Meredeth Ide, MD  bisacodyl (DULCOLAX) 5 MG EC tablet Take 1 tablet (5 mg total) by mouth daily as needed for moderate constipation. Patient not taking: Reported on 11/13/2018 09/04/18   Rodolph Bong, MD  carbamazepine (TEGRETOL) 200 MG tablet Take 0.5 tablets (100 mg total) by mouth 2 (two) times daily. Patient not taking: Reported on 11/13/2018 09/18/18   Meredeth Ide, MD  carvedilol (COREG) 6.25 MG tablet Take 1 tablet (6.25 mg total) by mouth 2 (two) times daily with a meal. Patient not taking: Reported on 11/13/2018 10/19/18   Meredeth Ide, MD  cephALEXin (KEFLEX) 500 MG capsule Take 1 capsule (500 mg total) by mouth 4 (four) times daily. Patient not taking: Reported on 11/13/2018 10/22/18   Roxy Horseman, PA-C  furosemide (LASIX) 20 MG tablet Take 3 tablets (60 mg total) by mouth daily for 30 days. Patient not taking: Reported on 11/13/2018 11/11/18 12/11/18  Michela Pitcher A, PA-C  hydrocortisone (PROCTO-PAK) 1 % CREA Apply twice a day to hemorrhoids. Patient not taking: Reported on 11/13/2018 09/29/18   Renne Crigler, PA-C  Insulin Glargine (LANTUS) 100 UNIT/ML Solostar Pen Inject 14 Units into the skin daily. Patient not taking: Reported on 11/13/2018 09/18/18   Meredeth Ide, MD  lisinopril (PRINIVIL,ZESTRIL) 10 MG tablet Take 1 tablet (10 mg total) by mouth daily. Patient not taking: Reported on 11/13/2018 09/18/18   Meredeth Ide, MD  nicotine (NICODERM CQ - DOSED IN MG/24 HOURS) 21 mg/24hr patch Place 1 patch (21 mg total) onto the skin daily. Patient not taking: Reported on 11/13/2018 09/04/18   Rodolph Bong, MD  polyethylene glycol [facility] / [facility] Hal) packet Take 17 g by mouth 2 (two) times daily. Patient not taking: Reported on 11/13/2018 09/18/18   Meredeth Ide, MD  Potassium Chloride ER 20 MEQ TBCR Take 20 mEq by mouth daily. Patient not taking: Reported on 11/13/2018 09/18/18   Meredeth Ide, MD  senna-docusate (SENOKOT-S) 8.6-50 MG tablet Take 1 tablet by mouth 2 (two) times daily. Patient not taking: Reported on 11/13/2018 09/04/18   Rodolph Bong, MD  tamsulosin [facility]) 0.4 MG CAPS capsule Take 1 capsule (0.4 mg total) by mouth daily after breakfast. Patient not taking:  Reported on 11/13/2018 10/05/18   Narda Bonds, MD    Family History Family History  Problem Relation Age of Onset  . Hypertension Other   . Diabetes Other     Social History Social History   Tobacco Use  . Smoking status: Current Every Day Smoker    Packs/day: 1.00    Years: 20.00    Pack years: 20.00    Types: Cigarettes  . Smokeless tobacco: Current User  Substance Use Topics  . Alcohol use: Yes    Comment: Daily Drinker   . Drug use: Yes    Frequency: 7.0 times per week    Types: "Crack" cocaine, Cocaine, Marijuana    Comment: used yesterday      Allergies   Haldol [haloperidol]   Review of Systems Review of Systems  Unable to perform ROS: Psychiatric disorder     Physical Exam Updated Vital Signs Pulse (!) 108   Temp 98 F (36.7 C) (Oral)   Resp (!) 22   Ht  (1.753 m)   SpO2 95%   BMI 30.72 kg/m  Physical Exam Vitals signs and nursing note reviewed.    58 year old male, resting comfortably and in no acute distress. Vital signs are significant for elevated heart rate and respiratory rate. Oxygen saturation is 95%, which is normal. Head is normocephalic and atraumatic. PERRLA, EOMI. Oropharynx is clear. Neck is nontender and supple without adenopathy or JVD. Back is nontender and there is no CVA tenderness. Lungs are clear without rales, wheezes, or rhonchi. Chest is nontender. Heart has regular rate and rhythm without murmur. Abdomen is soft, flat, nontender without masses or hepatosplenomegaly and peristalsis is normoactive.  Pitting edema is present up to the level of the umbilicus. Genitalia circumcised male, testes distended.  Penile and scrotal edema noted. Extremities have 3+ edema, full range of motion is present. Skin is warm and dry without rash. Neurologic: Awake and alert but with flight of ideas, cranial nerves are intact, there are no motor or sensory deficits.  ED Treatments / Results  Labs (all labs ordered are listed, but  only abnormal results are displayed) Labs Reviewed  BASIC METABOLIC PANEL  CBC  URINALYSIS, ROUTINE W REFLEX MICROSCOPIC  BRAIN NATRIURETIC PEPTIDE  CBG MONITORING, ED    EKG None  Radiology Dg Chest Portable 1 View  Result Date: 11/15/2018 CLINICAL DATA:  Fluid in stomach and legs. EXAM: PORTABLE CHEST 1 VIEW COMPARISON:  Nov 13, 2018 FINDINGS: Stable cardiomegaly. Increased interstitial markings in the lungs consistent with pulmonary venous congestion. Atelectasis in the left mid lung. No other acute abnormalities. IMPRESSION: Cardiomegaly and pulmonary venous congestion. Electronically Signed   By: Gerome Sam III M.D   On: 11/15/2018 09:11    Procedures Procedures  Medications Ordered in ED Medications  furosemide (LASIX) injection 40 mg (has no administration in time range)  acetaminophen (TYLENOL) tablet 650 mg (has no administration in time range)     Initial Impression / Assessment and Plan / ED Course  I have reviewed the triage vital signs and the nursing notes.  Pertinent labs & imaging results that were available during my care of the patient were reviewed by me and considered in my medical decision making (see chart for details).  Anasarca.  Old records are reviewed, and he has multiple ED visits as well as occasional hospitalization for heart failure.  He had been seen in the ED yesterday as well as twice 2 days ago.  He has 70 ED visits in the last 6 months.  Edema seems more prominent than noted on ED charts from May 17.  He had been given 40 mg of furosemide at 1 of those ED visits and 60 mg at the other.  He also received furosemide at ED visits on May 15, May 13, May 12 after having left the hospital AGAINST MEDICAL ADVICE on May 11 (admission for heart failure).  While in the ED, he had a 7 beat run of ventricular tachycardia captured on monitor.  It is felt that he needs admission based on worsening edema and run of ventricular tachycardia.  Labs show  stable anemia.  While potassium is not low, it is less than 4.  Given run of ventricular tachycardia, he is given additional potassium to try to get the level above 4.  Magnesium level is pending.  Case is discussed with Dr. Ophelia Charter of [facility], who agrees to admit the patient.  Final Clinical Impressions(s) / ED Diagnoses   Final diagnoses:  Acute on chronic diastolic heart failure (HCC)  Anasarca  Nonsustained ventricular tachycardia (HCC)  Normocytic anemia  Hyperglycemia  Elevated blood pressure reading with diagnosis of hypertension    ED Discharge Orders    None       Dione Booze, MD 11/17/18 [phone]

## 2018-11-17 NOTE — H&P (Signed)
History and Physical    [name] UJW:[id number] DOB: 1960/09/07 DOA: 11/17/2018  PCP: Patient, No Pcp Per  Patient coming from: Homeless  Chief Complaint: volume overload  HPI: [name] is a 58 y.o. male with medical history significant of chronic combined CHF; OSA; schizophrenia; polysubstance abuse; HTN; homelessness; hep C; and DM presenting with worsening edema.  He is frequently seen in the ER and occasionally admitted for CHF.  He continues to use cocaine (he smoked crack just prior to admission today) and does not take any of his outpatient medications.  He was quite somnolent at the time of my evaluation since he used crack cocaine overnight and it was wearing off.  He was able to wake up for brief periods and then lapse back into sleep.  He was unable to provide a significant history.  HPI per Dr. Preston Fleeting:  comes in stating that he has to pee and that his legs and abdomen are tight. After that, he starts making random statements with no coherent thought and cannot be directed to answer anything further.  He has had 14 admissions and 56(!) ER visits in the last 6 months.  His last hospitalization was from 5/9-11 for acute on chronic CHF exacerbation.  He left AMA from that hospitalization.  ED Course:  Frequent visits, worsening edema.  Anasarca to umbilicus.  Also with 7 beat run of vtach.  Here with CHF exacerbation,  Often leaves AMA, currently willing to stay.  Review of Systems: Unable to perform   Past Medical History:  Diagnosis Date  . Chronic foot pain   . Cocaine abuse (HCC)   . Diabetes mellitus without complication (HCC)   . Hepatitis C    unsure   . Homelessness   . Hypertension   . Neuropathy   . Polysubstance abuse (HCC)   . Schizophrenia (HCC)   . Sleep apnea   . Systolic and diastolic CHF, chronic (HCC)     Past Surgical History:  Procedure Laterality Date  . MULTIPLE TOOTH EXTRACTIONS      Social History   Socioeconomic  History  . Marital status: Single    Spouse name: Not on file  . Number of children: Not on file  . Years of education: Not on file  . Highest education level: Not on file  Occupational History  . Not on file  Social Needs  . Financial resource strain: Not on file  . Food insecurity:    Worry: Not on file    Inability: Not on file  . Transportation needs:    Medical: Not on file    Non-medical: Not on file  Tobacco Use  . Smoking status: Current Every Day Smoker    Packs/day: 1.00    Years: 20.00    Pack years: 20.00    Types: Cigarettes  . Smokeless tobacco: Current User  Substance and Sexual Activity  . Alcohol use: Yes    Comment: Daily Drinker   . Drug use: Yes    Frequency: 7.0 times per week    Types: "Crack" cocaine, Cocaine, Marijuana    Comment: used yesterday   . Sexual activity: Not Currently  Lifestyle  . Physical activity:    Days per week: Not on file    Minutes per session: Not on file  . Stress: Not on file  Relationships  . Social connections:    Talks on phone: Not on file    Gets together: Not on file    Attends  religious service: Not on file    Active member of club or organization: Not on file    Attends meetings of clubs or organizations: Not on file    Relationship status: Not on file  . Intimate partner violence:    Fear of current or ex partner: Not on file    Emotionally abused: Not on file    Physically abused: Not on file    Forced sexual activity: Not on file  Other Topics Concern  . Not on file  Social History Narrative   ** Merged History Encounter **        Allergies  Allergen Reactions  . Haldol [Haloperidol] Other (See Comments)    Muscle spasms, loss of voluntary movement. However, pt has taken Thorazine on multiple occasions with no adverse effects.     Family History  Problem Relation Age of Onset  . Hypertension Other   . Diabetes Other     Prior to Admission medications   Medication Sig Start Date End Date  Taking? Authorizing Provider  ARIPiprazole (ABILIFY) 10 MG tablet Take 1 tablet (10 mg total) by mouth daily. Patient not taking: Reported on 11/13/2018 09/18/18   Meredeth Ide, MD  aspirin EC 81 MG tablet Take 1 tablet (81 mg total) by mouth daily. Patient not taking: Reported on 11/13/2018 10/19/18 02/16/19  Meredeth Ide, MD  atorvastatin (LIPITOR) 40 MG tablet Take 1 tablet (40 mg total) by mouth daily. Patient not taking: Reported on 11/13/2018 09/18/18   Meredeth Ide, MD  bisacodyl (DULCOLAX) 5 MG EC tablet Take 1 tablet (5 mg total) by mouth daily as needed for moderate constipation. Patient not taking: Reported on 11/13/2018 09/04/18   Rodolph Bong, MD  carbamazepine (TEGRETOL) 200 MG tablet Take 0.5 tablets (100 mg total) by mouth 2 (two) times daily. Patient not taking: Reported on 11/13/2018 09/18/18   Meredeth Ide, MD  carvedilol (COREG) 6.25 MG tablet Take 1 tablet (6.25 mg total) by mouth 2 (two) times daily with a meal. Patient not taking: Reported on 11/13/2018 10/19/18   Meredeth Ide, MD  cephALEXin (KEFLEX) 500 MG capsule Take 1 capsule (500 mg total) by mouth 4 (four) times daily. Patient not taking: Reported on 11/13/2018 10/22/18   Roxy Horseman, PA-C  furosemide (LASIX) 20 MG tablet Take 3 tablets (60 mg total) by mouth daily for 30 days. Patient not taking: Reported on 11/13/2018 11/11/18 12/11/18  Michela Pitcher A, PA-C  hydrocortisone (PROCTO-PAK) 1 % CREA Apply twice a day to hemorrhoids. Patient not taking: Reported on 11/13/2018 09/29/18   Renne Crigler, PA-C  Insulin Glargine (LANTUS) 100 UNIT/ML Solostar Pen Inject 14 Units into the skin daily. Patient not taking: Reported on 11/13/2018 09/18/18   Meredeth Ide, MD  lisinopril (PRINIVIL,ZESTRIL) 10 MG tablet Take 1 tablet (10 mg total) by mouth daily. Patient not taking: Reported on 11/13/2018 09/18/18   Meredeth Ide, MD  nicotine (NICODERM CQ - DOSED IN MG/24 HOURS) 21 mg/24hr patch Place 1 patch (21 mg total) onto the skin  daily. Patient not taking: Reported on 11/13/2018 09/04/18   Rodolph Bong, MD  polyethylene glycol [facility] / Ethelene Hal) packet Take 17 g by mouth 2 (two) times daily. Patient not taking: Reported on 11/13/2018 09/18/18   Meredeth Ide, MD  Potassium Chloride ER 20 MEQ TBCR Take 20 mEq by mouth daily. Patient not taking: Reported on 11/13/2018 09/18/18   Meredeth Ide, MD  senna-docusate (SENOKOT-S) 8.6-50 MG tablet  Take 1 tablet by mouth 2 (two) times daily. Patient not taking: Reported on 11/13/2018 09/04/18   Rodolph Bong, MD  tamsulosin [facility]) 0.4 MG CAPS capsule Take 1 capsule (0.4 mg total) by mouth daily after breakfast. Patient not taking: Reported on 11/13/2018 10/05/18   Narda Bonds, MD    Physical Exam: Vitals:   11/17/18 0810 11/17/18 0900 11/17/18 0930 11/17/18 1030  BP:  (!) 174/104 (!) 172/94 (!) 177/107  Pulse:  (!) 106 (!) 101   Resp:  (!) 24 16 (!) 21  Temp:      TempSrc:      SpO2:  97% 97%   Weight: 94.3 kg     Height: 5\' 9"  (1.753 m)        . General:  Somnolent, disheveled . Eyes:   normal lids, iris . ENT:  grossly normal hearing, lips & tongue, mmm; poor dentition . Neck:  no LAD, masses or thyromegaly . Cardiovascular:  RR with mild tachycardia, no m/r/g. No LE edema.  Marland Kitchen Respiratory:   CTA bilaterally with no wheezes/rales/rhonchi.  Mildly increased respiratory effort. . Abdomen:  NT, NABS, taut due to significant abdominal wall edema . Skin:  no rash or induration seen on limited exam . Musculoskeletal:  grossly normal tone BUE/BLE, good ROM, no bony abnormality . Lower extremity:  No LE edema.  Limited foot exam with no ulcerations but callous formation and lichenification noted.  2+ distal pulses. Marland Kitchen Psychiatric:  Somnolent, speech sparse and somewhat incomprehensible . Neurologic:  CN 2-12 grossly intact, moves all extremities in coordinated fashion    Radiological Exams on Admission: Dg Chest Port 1 View  Result Date: 11/17/2018 CLINICAL  DATA:  Shortness of breath EXAM: PORTABLE CHEST 1 VIEW COMPARISON:  Two days ago FINDINGS: Chronic cardiomegaly. Chronic generalized interstitial coarsening. No Kerley lines, effusion, or pneumothorax. Chronic metallic foreign body over the right base. IMPRESSION: Chronic cardiomegaly and interstitial coarsening. No significant change compared to multiple recent priors. Electronically Signed   By: [name]   On: 11/17/2018 07:05    EKG: Independently reviewed.  Sinus tachycardia with rate 103; nonspecific ST changes with no evidence of acute ischemia   Labs on Admission: I have personally reviewed the available labs and imaging studies at the time of the admission.  Pertinent labs:   Glucose 400 WBC 10.0 Hgb 10.0; 9.8 on 5/17 BNP 1165.8  5/17 labs: AP 129 Albumin 2.8 BNP 967.3 Troponin 0.04   Assessment/Plan Principal Problem:   Acute on chronic combined systolic and diastolic CHF (congestive heart failure) (HCC) Active Problems:   Insulin-requiring or dependent type II diabetes mellitus (HCC)   Essential hypertension, benign   Cocaine use disorder, severe, dependence (HCC)   Chronic paranoid schizophrenia (HCC)   Homelessness   Tobacco use   HLD (hyperlipidemia)   Acute on chronic CHF -Patient with frequent ER visits and occasional admissions for the same presenting with worsening SOB and hypoxia and with significant edema up to his abdominal wall -CXR with CHF findings without obvious pulmonary edema -Elevated BNP -He did have a 7-beat run of vtach while in the hospital -He smoked crack cocaine prior to his presentation -Will admit with telemetry -5/9 echo with EF 25-30% with LVH and RV volume and pressure overload and a small pericardial effusion; consider repeat, but there is not obvious indication at this time -He stopped all previously-prescribed medications and did not fill new ones after his last hospitalization -Will resume 81 mg ASA -Will  resume  Lisinopril 10 mg daily  -No beta blocker due to recurrent use of cocaine  -CHF order set utilized -Was given Lasix 40 mg x 1 in ER and will repeat with 40 mg IV q8h; given the extent of his anasarca, if this is not providing effective diuresis could consider a Lasix drip -Continue [place] O2 for now -Normal kidney function at this time, will follow -Repeat EKG in AM -Will r/o with serial troponins although doubt ACS based on symptoms  HTN -He has not been taking any medications -Will restart Lisinopril for now and follow -Consider Coreg resumption  HLD -ResumeLipitor -Lipids were checked in 3/20 and were remarkably normal (TC 124, HDL 32, LDL 83, TG 47) so will not repeat at this time  DM -As with his other medications, he has been noncompliant -4/20 A1c was 10.2 -Resume Lantus -Will cover with moderate-scale SSI including qhs dosing  Cocaine/Schizophrenia/Homlessness -The patient continues to use crack cocaine and is a detriment to himself -Resume Abilify, Tegretol (possibly for mood stabilizer since he does not have a reported h/o seizures) -SW, CM, APS consults; he is at significant long-term risk if he is not placed  Tobacco dependence -Encourage cessation.   -Patch ordered    Note: This patient has been tested and is negative for the novel coronavirus COVID-19.   DVT prophylaxis: Lovenox  Code Status:  DNR - based on prior admission Family Communication: None present Disposition Plan:  To be determined Consults called: CM/SW/PT; Adult protective services  Admission status: Admit - It is my clinical opinion that admission to INPATIENT is reasonable and necessary because this patient will require at least 2 midnights in the hospital to treat this condition based on the medical complexity of the problems presented.  Given the aforementioned information, the predictability of an adverse outcome is felt to be significant.    Jonah BlueJennifer Donovin Kraemer MD [facility]   How to  contact the [facility] Attending or Consulting provider 7A - 7P or covering provider during after hours 7P -7A, for this patient?  1. Check the care team in [facility] and look for a) attending/consulting [facility] provider listed and b) the [facility] team listed 2. Log into [email or web address] and use [place]'s universal password to access. If you do not have the password, please contact the hospital operator. 3. Locate the [facility] provider you are looking for under [facility] and page to a number that you can be directly reached. 4. If you still have difficulty reaching the provider, please page the [facility] (Director on Call) for the Hospitalists listed on amion for assistance.   11/17/2018, 10:51 AM
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
## 2018-11-17 NOTE — Progress Notes (Signed)
Inpatient Diabetes Program Recommendations  AACE/ADA: New Consensus Statement on Inpatient Glycemic Control (2015)  Target Ranges:  Prepandial:   less than 140 mg/dL      Peak postprandial:   less than 180 mg/dL (1-2 hours)      Critically ill patients:  140 - 180 mg/dL   Results for [name], [name] (MRN [id number]) as of 11/17/2018 11:00  Ref. Range 11/17/2018 06:37  Glucose-Capillary Latest Ref Range: 70 - 99 mg/dL 893 (H)   Results for [name], [name] (MRN [id number]) as of 11/17/2018 11:00  Ref. Range 07/18/2018 07:02 10/18/2018 03:42  Hemoglobin A1C Latest Ref Range: 4.8 - 5.6 % 7.3 (H) 10.2 (H)     Admit with: CHF  History: DM, CHF, Schizophrenia, Substance Abuse, Homelessness  Home DM Meds: Lantus 14 units Daily (NOT taking per Home Med Rec)  Current Orders: Novolog Moderate Correction Scale/ SSI (0-15 units) TID AC + HS (to start at 12pm today)     Of note, patient left AMA on 05/11 after admission for CHF issues.  Has been to the ED 7 times since he left AMA on 05/11.  His Latest A1c of 10.2% shows poor control at home, however, he will continue to have issues with glucose control given his Psych issues, Substance Abuse issues, and Social issues since he is homeless.  Not sure Insulin is the best option for patient in the outpatient setting as he has no where safe to store it.     MD- Please consider the following in-hospital insulin adjustments:  1. May consider starting basal insulin for patient for in-hospital use--Recommend Lantus 10 units Daily to start (0.1 units/kg dosing based on weight of 94 kg)  2. May also consider switching pt to affordable oral DM medication regimen at time of discharge as his homelessness is not conducive to home insulin use     --Will follow patient during hospitalization--  Ambrose Finland RN, MSN, CDE Diabetes Coordinator Inpatient Glycemic Control Team Team Pager: [phone] (8a-5p)
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
## 2018-11-17 NOTE — ED Notes (Signed)
This RN acting as Statistician and asked pt if he would like for me to reach out to any family/friends for him. Pt declined at this time and states he can inform them.

## 2018-11-17 NOTE — ED Notes (Signed)
Lunch @ bedside. 

## 2018-11-17 NOTE — ED Notes (Signed)
Tobi Bastos (SR) lunch tray ordered @ 1156-per Millie, RN-paged by Marylene Land

## 2018-11-17 NOTE — ED Notes (Addendum)
Pt noted to be resting on stretcher - will open eyes and respond very briefly then return to sleeping. Monitor intact to pt. O2 infusing at 2L/min via n/c. Condom cath intact to pt - yellow urine.

## 2018-11-17 NOTE — ED Notes (Signed)
CBG 346  

## 2018-11-17 NOTE — ED Provider Notes (Signed)
Signout from Dr. Preston Fleeting.  58 year old male homeless, history of schizophrenia and poor compliance with medical management here with increased peripheral edema and elevated blood sugar. Physical Exam  BP (!) 181/116   Pulse (!) 108   Temp 98 F (36.7 C) (Oral)   Resp (!) 24   Ht 5\' 9"  (1.753 m)   SpO2 95%   BMI 30.72 kg/m   Physical Exam  ED Course/Procedures     Procedures  MDM  Patient had a run of V. tach here.  Plan is to follow-up on electrolytes and then proceed with admission to the hospitalist service for continued management of his problems.       Terrilee Files, MD 11/17/18 1754

## 2018-11-17 NOTE — ED Triage Notes (Signed)
Pt c/o hyperglycemia, swollen genitalia, abdomen, and legs. Last smoked crack last night.

## 2018-11-17 NOTE — Clinical Social Work Note (Signed)
CSW acknowledges consult regarding substance abuse. Patient has been given substance abuse resources on numerous occassions by the social work dept. If patient is interested in receiving and utilizing these resources, please reconsult the social work dept.  Charlynn Court, CSW [phone]

## 2018-11-18 ENCOUNTER — Encounter (HOSPITAL_COMMUNITY): Payer: Self-pay | Admitting: General Practice

## 2018-11-18 DIAGNOSIS — Z720 Tobacco use: Secondary | ICD-10-CM

## 2018-11-18 DIAGNOSIS — E7800 Pure hypercholesterolemia, unspecified: Secondary | ICD-10-CM

## 2018-11-18 DIAGNOSIS — Z794 Long term (current) use of insulin: Secondary | ICD-10-CM

## 2018-11-18 DIAGNOSIS — Z590 Homelessness: Secondary | ICD-10-CM

## 2018-11-18 DIAGNOSIS — F200 Paranoid schizophrenia: Secondary | ICD-10-CM

## 2018-11-18 DIAGNOSIS — E119 Type 2 diabetes mellitus without complications: Secondary | ICD-10-CM

## 2018-11-18 DIAGNOSIS — I10 Essential (primary) hypertension: Secondary | ICD-10-CM

## 2018-11-18 DIAGNOSIS — F1420 Cocaine dependence, uncomplicated: Secondary | ICD-10-CM

## 2018-11-18 LAB — CBC WITH DIFFERENTIAL/PLATELET
Abs Immature Granulocytes: 0.02 10*3/uL (ref 0.00–0.07)
Basophils Absolute: 0.1 10*3/uL (ref 0.0–0.1)
Basophils Relative: 1 %
Eosinophils Absolute: 0.1 10*3/uL (ref 0.0–0.5)
Eosinophils Relative: 2 %
HCT: 30.6 % — ABNORMAL LOW (ref 39.0–52.0)
Hemoglobin: 9 g/dL — ABNORMAL LOW (ref 13.0–17.0)
Immature Granulocytes: 0 %
Lymphocytes Relative: 12 %
Lymphs Abs: 1 10*3/uL (ref 0.7–4.0)
MCH: 23.7 pg — ABNORMAL LOW (ref 26.0–34.0)
MCHC: 29.4 g/dL — ABNORMAL LOW (ref 30.0–36.0)
MCV: 80.7 fL (ref 80.0–100.0)
Monocytes Absolute: 0.9 10*3/uL (ref 0.1–1.0)
Monocytes Relative: 12 %
Neutro Abs: 5.8 10*3/uL (ref 1.7–7.7)
Neutrophils Relative %: 73 %
Platelets: 279 10*3/uL (ref 150–400)
RBC: 3.79 MIL/uL — ABNORMAL LOW (ref 4.22–5.81)
RDW: 19.9 % — ABNORMAL HIGH (ref 11.5–15.5)
WBC: 7.9 10*3/uL (ref 4.0–10.5)
nRBC: 0.3 % — ABNORMAL HIGH (ref 0.0–0.2)

## 2018-11-18 LAB — BASIC METABOLIC PANEL
Anion gap: 5 (ref 5–15)
BUN: 17 mg/dL (ref 6–20)
CO2: 31 mmol/L (ref 22–32)
Calcium: 8.8 mg/dL — ABNORMAL LOW (ref 8.9–10.3)
Chloride: 107 mmol/L (ref 98–111)
Creatinine, Ser: 0.97 mg/dL (ref 0.61–1.24)
GFR calc Af Amer: >60 mL/min (ref >60)
GFR calc non Af Amer: >60 mL/min (ref >60)
Glucose, Bld: 118 mg/dL — ABNORMAL HIGH (ref 70–99)
Potassium: 3.7 mmol/L (ref 3.5–5.1)
Sodium: 143 mmol/L (ref 135–145)

## 2018-11-18 LAB — GLUCOSE, CAPILLARY
Glucose-Capillary: 190 mg/dL — ABNORMAL HIGH (ref 70–99)
Glucose-Capillary: 187 mg/dL — ABNORMAL HIGH (ref 70–99)
Glucose-Capillary: 203 mg/dL — ABNORMAL HIGH (ref 70–99)
Glucose-Capillary: 181 mg/dL — ABNORMAL HIGH (ref 70–99)

## 2018-11-18 LAB — BRAIN NATRIURETIC PEPTIDE: B Natriuretic Peptide: 1146.8 pg/mL — ABNORMAL HIGH (ref 0.0–100.0)

## 2018-11-18 MED ORDER — DOCUSATE SODIUM 100 MG PO CAPS
100.0000 mg | ORAL_CAPSULE | Freq: Two times a day (BID) | ORAL | Status: DC
  Administered 2018-11-18 – 2018-11-20 (×5): 100 mg via ORAL
  Filled 2018-11-18 (×5): qty 1

## 2018-11-18 MED ORDER — LORAZEPAM 1 MG PO TABS
1.0000 mg | ORAL_TABLET | Freq: Four times a day (QID) | ORAL | Status: DC | PRN
  Administered 2018-11-18 – 2018-11-19 (×2): 1 mg via ORAL
  Filled 2018-11-18 (×2): qty 1

## 2018-11-18 MED ORDER — CARBAMAZEPINE 200 MG PO TABS
100.0000 mg | ORAL_TABLET | Freq: Two times a day (BID) | ORAL | Status: DC
  Administered 2018-11-18 – 2018-11-20 (×6): 100 mg via ORAL
  Filled 2018-11-18 (×7): qty 0.5

## 2018-11-18 MED ORDER — INSULIN GLARGINE 100 UNIT/ML ~~LOC~~ SOLN
14.0000 [IU] | Freq: Every day | SUBCUTANEOUS | Status: DC
  Administered 2018-11-18 – 2018-11-19 (×2): 14 [IU] via SUBCUTANEOUS
  Filled 2018-11-18 (×2): qty 0.14

## 2018-11-18 MED ORDER — TRAMADOL HCL 50 MG PO TABS
50.0000 mg | ORAL_TABLET | Freq: Once | ORAL | Status: AC
  Administered 2018-11-18: 22:00:00 50 mg via ORAL
  Filled 2018-11-18: qty 1

## 2018-11-18 MED ORDER — ASPIRIN EC 81 MG PO TBEC
81.0000 mg | DELAYED_RELEASE_TABLET | Freq: Every day | ORAL | Status: DC
  Administered 2018-11-18 – 2018-11-20 (×3): 81 mg via ORAL
  Filled 2018-11-18 (×3): qty 1

## 2018-11-18 MED ORDER — ARIPIPRAZOLE 5 MG PO TABS
10.0000 mg | ORAL_TABLET | Freq: Every day | ORAL | Status: DC
  Administered 2018-11-18 – 2018-11-20 (×3): 10 mg via ORAL
  Filled 2018-11-18 (×3): qty 2

## 2018-11-18 MED ORDER — POLYETHYLENE GLYCOL 3350 17 G PO PACK
17.0000 g | PACK | Freq: Two times a day (BID) | ORAL | Status: DC
  Administered 2018-11-18 – 2018-11-20 (×5): 17 g via ORAL
  Filled 2018-11-18 (×5): qty 1

## 2018-11-18 MED ORDER — ATORVASTATIN CALCIUM 40 MG PO TABS
40.0000 mg | ORAL_TABLET | Freq: Every day | ORAL | Status: DC
  Administered 2018-11-18 – 2018-11-20 (×3): 40 mg via ORAL
  Filled 2018-11-18 (×3): qty 1

## 2018-11-18 MED ORDER — HYDRALAZINE HCL 20 MG/ML IJ SOLN: 10.0000 mg | Freq: Four times a day (QID) | INTRAMUSCULAR | Status: DC | PRN

## 2018-11-18 MED ORDER — LISINOPRIL 10 MG PO TABS
10.0000 mg | ORAL_TABLET | Freq: Every day | ORAL | Status: DC
  Administered 2018-11-18: 10 mg via ORAL
  Filled 2018-11-18: qty 1

## 2018-11-18 MED ORDER — LISINOPRIL 20 MG PO TABS
20.0000 mg | ORAL_TABLET | Freq: Every day | ORAL | Status: DC
  Administered 2018-11-19 – 2018-11-20 (×2): 20 mg via ORAL
  Filled 2018-11-18 (×2): qty 1

## 2018-11-18 NOTE — Progress Notes (Signed)
PROGRESS NOTE  [name] ZOX:[id number] DOB: 1961/06/24 DOA: 11/17/2018 PCP: Patient, No Pcp Per   LOS: 1 day   Brief narrative: 58 year old male with significant past medical history of paranoid schizophrenia, substance abuse, hypertension, type 2 diabetes mellitus andhepatitis C presented to the hospital with lower extremity edema, testicular swelling and generalized weakness. Patient had about 13 admissions for the last 6 months. On his initial physical examination his blood pressure was 157/89, heart rate 88, respiratory rate 19, temperature 98, oxygen saturation 91%,his lungs are clear to auscultation bilaterally, heart S1-S2 present and rhythmic, no gallops, rubs or murmurs, his abdomen was protuberant, distended, positive for plus pitting bilateral extremity edema, positive scrotal edema. Sodium 140, potassium 3.0, chloride 96, bicarb 29, glucose 310, BUN 23, creatinine 1.0, BNP 1280, white count 9.5, hemoglobin 10.6, hematocrit 36.3, platelets 283. SARSCOVID-19 negative.Tox screen positive for cocaine. Alcohol less than 10. His chest x-ray had cardiomegaly with increased hilar vascular congestion. EKG 105 bpm, normal axis, normal intervals, sinus rhythm, no ST segment changes, chronicT wave inversions in V5 to V6.Poor R wave progression. Patient was admitted to the hospital working diagnosis of acute decompensation of systolic heart failure.  Subjective: Today patient complains of feeling better with breathing but has a significant edema.  He complains of constipation and wishes for laxative.  He wishes dual portion meals.  Patient denies any chest pain, shortness of breath or fever.  Assessment/Plan:  Principal Problem:   Acute on chronic combined systolic and diastolic CHF (congestive heart failure) (HCC) Active Problems:   Insulin-requiring or dependent type II diabetes mellitus (HCC)   Essential hypertension, benign   Cocaine use disorder, severe, dependence  (HCC)   Chronic paranoid schizophrenia (HCC)   Homelessness   Tobacco use   HLD (hyperlipidemia)  Acute on chronic decompensated systolic heart failure. Patient has significant volume overload and is still decompensated.  We will continue IV diuresis.  Closely monitor renal function.  Patient was encouraged compliance with dietary regimen and medications.  High-risk for recurrent admissions due to dietary and medication noncompliance.  Currently on Lasix 40 mg IV every 8 hourly.  Continue CHF education, fluid restriction.  Essential HTN.   Blood pressure is still accelerated.  Will consider titrating ACE inhibitor's.  Continue Coreg.  Diabetes mellitus type II.   Overall adequate control today.  Continue on sliding scale insulin, Lantus..  Patient requests double portion meals.  Polysubstance abuse, cocaine intoxication and tobacco abuse.   Continue nicotine patch.  Continue Coreg.  No signs of withdrawal.  Schizophrenia/ paranoid.   On lorazepam, aripiprazole and carbamazepine.    Stabilization  Stage 2 pressure ulcer at the sacrum and right hip, present on admission continue local wound care.   Morbid obesity.  Obesity.  Dietary counseling done.  Constipation.  Patient on MiraLAX.  We will add stool softener.  Homelessness.  Recurrent hospitalizations.  Social worker on board.  VTE Prophylaxis: Lovenox  Code Status: DNR  Family Communication: None  Disposition Plan: Home likely in 2 to 3 days.  Patient is still decompensated.  Very high risk for recurrent admissions  Consultants:  None  Procedures:  None  Antibiotics: Anti-infectives (From admission, onward)   None      Objective: Vitals:   11/18/18 0407 11/18/18 0544  BP: (!) 185/121 (!) 155/116  Pulse: (!) 122 (!) 105  Resp:    Temp: 98.4 F (36.9 C)   SpO2: 95%     Intake/Output Summary (Last 24 hours) at 11/18/2018 [phone]  Last data filed at 11/18/2018 0820 Gross per 24 hour  Intake 1815 ml  Output  2400 ml  Net -585 ml   Filed Weights   11/17/18 0810 11/17/18 1526 11/18/18 0407  Weight: 94.3 kg 115.9 kg 114.3 kg   Body mass index is 37.2 kg/m.   Physical Exam: GENERAL: Patient is alert awake and oriented. Not in obvious distress.  Obese HENT: No scleral pallor or icterus. Pupils equally reactive to light. Oral mucosa is moist NECK: is supple, no palpable thyroid enlargement. CHEST: Clear to auscultation. No crackles or wheezes. Non tender on palpation. Diminished breath sounds bilaterally. CVS: S1 and S2 heard, no murmur. Regular rate and rhythm. No pericardial rub. ABDOMEN: Soft, non-tender, bowel sounds are present. No palpable hepato-splenomegaly.  Scrotal edema.  Condom catheter in place. EXTREMITIES: Bilateral lower extremity pitting edema noted CNS: Cranial nerves are intact. No focal motor or sensory deficits. SKIN: warm and dry without rashes.  Data Review: I have personally reviewed the following laboratory data and studies,  CBC: Recent Labs  Lab 11/13/18 1023 11/13/18 1053 11/15/18 0748 11/17/18 0622 11/18/18 0023  WBC 5.9  --  8.5 10.0 7.9  NEUTROABS 4.1  --  6.5  --  5.8  HGB 9.4* 11.2* 9.7* 10.0* 9.0*  HCT 32.0* 33.0* 33.8* 34.4* 30.6*  MCV 83.3  --  82.4 82.5 80.7  PLT 217  --  244 284 279   Basic Metabolic Panel: Recent Labs  Lab 11/13/18 1023 11/13/18 1053 11/15/18 0748 11/17/18 0622 11/18/18 0023  NA 141 142 141 139 143  K 3.5 3.6 3.7 3.7 3.7  CL 103  --  101 100 107  CO2 30  --  GLUCOSE 126*  --  343* 400* 118*  BUN 23*  --  CREATININE 0.90  --  1.19 1.18 0.97  CALCIUM 8.7*  --  8.8* 9.0 8.8*  MG  --   --   --  2.0  --    Liver Function Tests: Recent Labs  Lab 11/15/18 0748  AST 32  ALT 19  ALKPHOS 129*  BILITOT 0.6  PROT 6.8  ALBUMIN 2.8*   No results for input(s): LIPASE, AMYLASE in the last 168 hours. No results for input(s): AMMONIA in the last 168 hours. Cardiac Enzymes: Recent Labs  Lab 11/13/18  1023 11/15/18 0748 11/17/18 1518  TROPONINI 0.04* 0.04* 0.04*   BNP (last 3 results) Recent Labs    11/13/18 1023 11/15/18 0748 11/17/18 0622  BNP 1,076.2* 967.3* 1,165.8*    ProBNP (last 3 results) No results for input(s): PROBNP in the last 8760 hours.  CBG: Recent Labs  Lab 11/17/18 0637 11/17/18 1246 11/17/18 1759 11/17/18 2153 11/18/18 0644  GLUCAP 333* 326* 252* 114* 190*   Recent Results (from the past 240 hour(s))  SARS Coronavirus 2 (CEPHEID - Performed in [facility] hospital lab), Hosp Order     Status: None   Collection Time: 11/17/18  9:46 AM  Result Value Ref Range Status   SARS Coronavirus 2 NEGATIVE NEGATIVE Final    Comment: (NOTE) If result is NEGATIVE SARS-CoV-2 target nucleic acids are NOT DETECTED. The SARS-CoV-2 RNA is generally detectable in upper and lower  respiratory specimens during the acute phase of infection. The lowest  concentration of SARS-CoV-2 viral copies this assay can detect is 250  copies / mL. A negative result does not preclude SARS-CoV-2 infection  and should not be used as the sole basis for  treatment or other  patient management decisions.  A negative result may occur with  improper specimen collection / handling, submission of specimen other  than nasopharyngeal swab, presence of viral mutation(s) within the  areas targeted by this assay, and inadequate number of viral copies  (<250 copies / mL). A negative result must be combined with clinical  observations, patient history, and epidemiological information. If result is POSITIVE SARS-CoV-2 target nucleic acids are DETECTED. The SARS-CoV-2 RNA is generally detectable in upper and lower  respiratory specimens dur ing the acute phase of infection.  Positive  results are indicative of active infection with SARS-CoV-2.  Clinical  correlation with patient history and other diagnostic information is  necessary to determine patient infection status.  Positive results do  not  rule out bacterial infection or co-infection with other viruses. If result is PRESUMPTIVE POSTIVE SARS-CoV-2 nucleic acids MAY BE PRESENT.   A presumptive positive result was obtained on the submitted specimen  and confirmed on repeat testing.  While 2019 novel coronavirus  (SARS-CoV-2) nucleic acids may be present in the submitted sample  additional confirmatory testing may be necessary for epidemiological  and / or clinical management purposes  to differentiate between  SARS-CoV-2 and other Sarbecovirus currently known to infect humans.  If clinically indicated additional testing with an alternate test  methodology [phone]) is advised. The SARS-CoV-2 RNA is generally  detectable in upper and lower respiratory sp ecimens during the acute  phase of infection. The expected result is Negative. Fact Sheet for Patients:  BoilerBrush.com.cy Fact Sheet for Healthcare Providers: https://pope.com/ This test is not yet approved or cleared by the [place] FDA and has been authorized for detection and/or diagnosis of SARS-CoV-2 by FDA under an Emergency Use Authorization (EUA).  This EUA will remain in effect (meaning this test can be used) for the duration of the COVID-19 declaration under Section 564(b)(1) of the Act, 21 U.S.C. section 360bbb-3(b)(1), unless the authorization is terminated or revoked sooner. Performed at [facility], [address], [place], [place] [address]      Studies: Dg Chest Port 1 View  Result Date: 11/17/2018 CLINICAL DATA:  Shortness of breath EXAM: PORTABLE CHEST 1 VIEW COMPARISON:  Two days ago FINDINGS: Chronic cardiomegaly. Chronic generalized interstitial coarsening. No Kerley lines, effusion, or pneumothorax. Chronic metallic foreign body over the right base. IMPRESSION: Chronic cardiomegaly and interstitial coarsening. No significant change compared to multiple recent priors. Electronically Signed    By: [name]   On: 11/17/2018 07:05    Scheduled Meds: . ARIPiprazole  10 mg Oral Daily  . aspirin EC  81 mg Oral Daily  . atorvastatin  40 mg Oral Daily  . carbamazepine  100 mg Oral BID  . enoxaparin (LOVENOX) injection  40 mg Subcutaneous Q24H  . furosemide  40 mg Intravenous Q8H  . insulin aspart  0-15 Units Subcutaneous TID WC  . insulin aspart  0-5 Units Subcutaneous QHS  . insulin glargine  14 Units Subcutaneous Daily  . lisinopril  10 mg Oral Daily  . nicotine  21 mg Transdermal Daily  . polyethylene glycol  17 g Oral BID  . sodium chloride flush  3 mL Intravenous Q12H    Continuous Infusions: . sodium chloride       Joycelyn Das, MD  [facility] 11/18/2018
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
## 2018-11-18 NOTE — Progress Notes (Signed)
Patient has been doing a lot of sleeping but he is  arousable. As soon as patient is awake, he is very confrontational regarding wanting soda or water to drink.  Patient is on strict I/O's and is here for CHF. Staff have been re-educating and reminding patient that he must be mindful of his fluid intake.  Patient sometimes curse at staff and become non-compliant in attempts to get his multiple drinks and snacks.

## 2018-11-18 NOTE — Progress Notes (Signed)
Inpatient Diabetes Program Recommendations  AACE/ADA: New Consensus Statement on Inpatient Glycemic Control (2015)  Target Ranges:  Prepandial:   less than 140 mg/dL      Peak postprandial:   less than 180 mg/dL (1-2 hours)      Critically ill patients:  140 - 180 mg/dL   Lab Results  Component Value Date   GLUCAP 190 (H) 11/18/2018   HGBA1C 10.2 (H) 10/18/2018    Review of Glycemic Control  AM CBG > 180 mg/dL May benefit from addition of basal insulin.  Inpatient Diabetes Program Recommendations:     Add Lantus 10 units QD.  Will follow blood sugar trends.  Thank you. Ailene Ards, RD, LDN, CDE Inpatient Diabetes Coordinator [phone]

## 2018-11-18 NOTE — Progress Notes (Signed)
Patient had new onset of second degree block.  On-call MD notified through Amion.  Patient is sleeping.

## 2018-11-18 NOTE — Consult Note (Signed)
WOC Nurse wound consult note Patient receiving care in [facility] 3E12.  I spoke with his primary RN, Jasmine December via telephone. Reason for Consult: wound on penis Wound type: patient arrived with the wound on his penis and it is not known what caused are "raw" area. Jasmine December has explained to me that the patient has a condom catheter on as the medical team needs strict I&O (which there is an order for).  Also, the patient has been sleeping most of the night and there is urine in the bedside drainage bag.  I am assuming the patient would otherwise be incontinent of urine and staff would be unable to maintain I&O if the condom cath were removed.  In summary my recommendations are: 1. Continue the condom cath to allow for I&O tracking in this patient that has been admitted for CHF and is being diuresed.  2.  If the patient becomes continent of urine, or there is no longer a need for I&O tracking, the condom cath should be removed and the WOC service re-consulted if there are continuing concerns about the area on the penis. Monitor the wound area(s) for worsening of condition such as: Signs/symptoms of infection,  Increase in size,  Development of or worsening of odor, Development of pain, or increased pain at the affected locations.  Notify the medical team if any of these develop.  Thank you for the consult.  Discussed plan of care with the bedside nurse.  WOC nurse will not follow at this time.  Please re-consult the WOC team if needed.  Helmut Muster, RN, MSN, CWOCN, CNS-BC, pager [phone]

## 2018-11-18 NOTE — Evaluation (Addendum)
Physical Therapy Evaluation Patient Details Name: [name] MRN: [id number] DOB: [birth year] Today's Date: 11/18/2018   History of Present Illness  58 y.o. male with medical history significant of chronic combined CHF; OSA; schizophrenia; polysubstance abuse; HTN; homelessness; hep C; and DM presenting with worsening edema.  He is frequently seen in the ER (55 times in last 6 months) and admitted 14 times for CHF. Last hospitalization 5/9-11, left AMA. Admitted 5/19 for CHF.    Clinical Impression  PTA pt homeless, staying at a shelter occassionally. Pt reports increasing difficulty getting around due to increased swelling. Pt states he would like to go to a "rest home." and had a FL-2 from prior visit but lost it. Pt is limited in safe mobility by decrease ROM, balance and strength. Pt currently requires min guard for bed mobility, and min A for transfers and ambulation of 30 feet with RW. PT recommending SNF level rehab to improve mobility. Although pt currently expresses interest in placement, he will be difficult to place and will likely ultimately refuse in which case he will need increased therapy and at least a RW for safe mobility. PT will continue to follow acutely.     Follow Up Recommendations SNF    Equipment Recommendations  Other (comment)(TBD at next venue)    Recommendations for Other Services OT consult     Precautions / Restrictions Precautions Precautions: None Restrictions Weight Bearing Restrictions: No      Mobility  Bed Mobility Overal bed mobility: Needs Assistance Bed Mobility: Supine to Sit;Sit to Supine     Supine to sit: Min guard     General bed mobility comments: min guard for safety, decreased trunk flexion due to increased abdominal swelling, able to bring self to side of bed with increased pull on bed rail  Transfers Overall transfer level: Needs assistance Equipment used: Rolling walker (2 wheeled) Transfers: Sit to/from Stand Sit  to Stand: Min assist         General transfer comment: min A for power up, unable to adduct hips enough for both LE to fit inside RW, min A for steadying.  Ambulation/Gait Ambulation/Gait assistance: Min assist Gait Distance (Feet): 30 Feet Assistive device: Rolling walker (2 wheeled) Gait Pattern/deviations: Step-through pattern;Shuffle;Wide base of support;Trunk flexed Gait velocity: slowed Gait velocity interpretation: <1.8 ft/sec, indicate of risk for recurrent falls General Gait Details: min A for steadying,pt with wide BoS, flexed posture and waddling gait due to increased LE swelling,       Balance Overall balance assessment: Needs assistance Sitting-balance support: Feet supported Sitting balance-Leahy Scale: Fair   Postural control: Posterior lean(due to swollen abdomen) Standing balance support: Bilateral upper extremity supported Standing balance-Leahy Scale: Poor Standing balance comment: requires bilateral UE support                             Pertinent Vitals/Pain Pain Assessment: 0-10 Pain Score: 9  Pain Location: abdomen and upper legs Pain Descriptors / Indicators: Pressure;Tightness;Throbbing Pain Intervention(s): Limited activity within patient's tolerance;Monitored during session;Repositioned    Home Living Family/patient expects to be discharged to:: Shelter/Homeless(reports wanting to go to "rest home" but lost his FL-2) Living Arrangements: Alone                    Prior Function Level of Independence: Independent                  Extremity/Trunk Assessment   Upper Extremity  Assessment Upper Extremity Assessment: Overall WFL for tasks assessed    Lower Extremity Assessment Lower Extremity Assessment: RLE deficits/detail;LLE deficits/detail RLE Deficits / Details: increased swelling in LE, restricting AROM of hip and knees, strength grossly assessed from mobility 3+/5 LLE Deficits / Details: increased swelling in  LE, restricting AROM of hip and knees, strength grossly assessed from mobility 3+/5       Communication   Communication: No difficulties  Cognition Arousal/Alertness: Awake/alert Behavior During Therapy: Flat affect;Impulsive Overall Cognitive Status: History of cognitive impairments - at baseline                                        General Comments General comments (skin integrity, edema, etc.): Increased swelling of abdomen and thighs, skin with taught shiny appearance. max HR with ambulation 123 bpm        Assessment/Plan    PT Assessment Patient needs continued PT services  PT Problem List Decreased range of motion;Decreased activity tolerance;Decreased balance;Decreased mobility;Decreased safety awareness;Pain       PT Treatment Interventions DME instruction;Gait training;Functional mobility training;Therapeutic activities;Therapeutic exercise;Balance training;Cognitive remediation;Patient/family education    PT Goals (Current goals can be found in the Care Plan section)  Acute Rehab PT Goals Patient Stated Goal: go to a rest home PT Goal Formulation: With patient Time For Goal Achievement: 12/02/18 Potential to Achieve Goals: Fair    Frequency Min 2X/week   Barriers to discharge Decreased caregiver support         AM-PAC PT "6 Clicks" Mobility  Outcome Measure Help needed turning from your back to your side while in a flat bed without using bedrails?: None Help needed moving from lying on your back to sitting on the side of a flat bed without using bedrails?: None Help needed moving to and from a bed to a chair (including a wheelchair)?: A Little Help needed standing up from a chair using your arms (e.g., wheelchair or bedside chair)?: A Little Help needed to walk in hospital room?: A Little Help needed climbing 3-5 steps with a railing? : A Lot 6 Click Score: 19    End of Session Equipment Utilized During Treatment: Gait belt Activity  Tolerance: Patient limited by pain Patient left: in bed;with call bell/phone within reach;with family/visitor present(sitting EoB ) Nurse Communication: Mobility status PT Visit Diagnosis: Unsteadiness on feet (R26.81);Other abnormalities of gait and mobility (R26.89);Muscle weakness (generalized) (M62.81);Difficulty in walking, not elsewhere classified (R26.2);Pain Pain - Right/Left: (bilateral) Pain - part of body: Leg(abdomen)    Time: 5643-32951042-1058 PT Time Calculation (min) (ACUTE ONLY): 16 min   Charges:   PT Evaluation $PT Eval Moderate Complexity: 1 Mod          Kyani Simkin B. Beverely RisenVan Fleet PT, DPT Acute Rehabilitation Services Pager [phone] Office [phone]   Elon Alaslizabeth B Van Fleet 11/18/2018, 12:43 PM

## 2018-11-19 LAB — GLUCOSE, CAPILLARY
Glucose-Capillary: 241 mg/dL — ABNORMAL HIGH (ref 70–99)
Glucose-Capillary: 193 mg/dL — ABNORMAL HIGH (ref 70–99)
Glucose-Capillary: 206 mg/dL — ABNORMAL HIGH (ref 70–99)
Glucose-Capillary: 185 mg/dL — ABNORMAL HIGH (ref 70–99)

## 2018-11-19 LAB — CBC
HCT: 31.7 % — ABNORMAL LOW (ref 39.0–52.0)
Hemoglobin: 9.3 g/dL — ABNORMAL LOW (ref 13.0–17.0)
MCH: 23.7 pg — ABNORMAL LOW (ref 26.0–34.0)
MCHC: 29.3 g/dL — ABNORMAL LOW (ref 30.0–36.0)
MCV: 80.9 fL (ref 80.0–100.0)
Platelets: 284 10*3/uL (ref 150–400)
RBC: 3.92 MIL/uL — ABNORMAL LOW (ref 4.22–5.81)
RDW: 20.1 % — ABNORMAL HIGH (ref 11.5–15.5)
WBC: 7.6 10*3/uL (ref 4.0–10.5)
nRBC: 0.3 % — ABNORMAL HIGH (ref 0.0–0.2)

## 2018-11-19 LAB — BASIC METABOLIC PANEL
Anion gap: 7 (ref 5–15)
BUN: 22 mg/dL — ABNORMAL HIGH (ref 6–20)
CO2: 31 mmol/L (ref 22–32)
Calcium: 8.9 mg/dL (ref 8.9–10.3)
Chloride: 102 mmol/L (ref 98–111)
Creatinine, Ser: 1.09 mg/dL (ref 0.61–1.24)
GFR calc Af Amer: >60 mL/min (ref >60)
GFR calc non Af Amer: >60 mL/min (ref >60)
Glucose, Bld: 232 mg/dL — ABNORMAL HIGH (ref 70–99)
Potassium: 4.1 mmol/L (ref 3.5–5.1)
Sodium: 140 mmol/L (ref 135–145)

## 2018-11-19 LAB — MAGNESIUM: Magnesium: 2.1 mg/dL (ref 1.7–2.4)

## 2018-11-19 MED ORDER — HYDRALAZINE HCL 25 MG PO TABS
25.0000 mg | ORAL_TABLET | Freq: Three times a day (TID) | ORAL | Status: DC
  Administered 2018-11-19 – 2018-11-20 (×4): 25 mg via ORAL
  Filled 2018-11-19 (×4): qty 1

## 2018-11-19 MED ORDER — INSULIN GLARGINE 100 UNIT/ML ~~LOC~~ SOLN
16.0000 [IU] | Freq: Every day | SUBCUTANEOUS | Status: DC
  Administered 2018-11-20: 16 [IU] via SUBCUTANEOUS
  Filled 2018-11-19: qty 0.16

## 2018-11-19 NOTE — Progress Notes (Signed)
Pt refusing bed alarm and refusing to use walker at times. Will continue to monitor pt

## 2018-11-19 NOTE — Progress Notes (Signed)
Inpatient Diabetes Program Recommendations  AACE/ADA: New Consensus Statement on Inpatient Glycemic Control (2015)  Target Ranges:  Prepandial:   less than 140 mg/dL      Peak postprandial:   less than 180 mg/dL (1-2 hours)      Critically ill patients:  140 - 180 mg/dL   Lab Results  Component Value Date   GLUCAP 241 (H) 11/19/2018   HGBA1C 10.2 (H) 10/18/2018    Review of Glycemic Control  Blood sugars 232, 241 mg/dL this morning. Goal is < 180 mg/dL.  Inpatient Diabetes Program Recommendations:     Increase Lantus to 16 units QD. If post-prandial blood sugars > 180 mg/dL, consider adding Novolog 3 units tidwc for meal coverage insulin. Add CHO mod med to heart healthy diet.  Will continue to follow.  Thank you. Ailene Ards, RD, LDN, CDE Inpatient Diabetes Coordinator [phone]

## 2018-11-19 NOTE — Progress Notes (Signed)
Physical Therapy Treatment Patient Details Name: [name] MRN: [id number] DOB: February 02, 1961 Today's Date: 11/19/2018    History of Present Illness 58 y.o. male with medical history significant of chronic combined CHF; OSA; schizophrenia; polysubstance abuse; HTN; homelessness; hep C; and DM presenting with worsening edema.  He is frequently seen in the ER (55 times in last 6 months) and admitted 14 times for CHF. Last hospitalization 5/9-11, left AMA. Admitted 5/19 for CHF.      PT Comments    Pt is very lethargic during session requiring maximal verbal and tactile stimulation to arouse. Pt was min guard to come to EoB where RN administered medication. Pt fell asleep twice during that time. Pt with minimal conversation answering only yes/no questions. Pt ambulated 70 feet with RW and minA, requiring maximal verbal cuing for safety with RW. Pt is making progress however continues to be limited in safe mobility by decreased strength and endurance. PT continues to recommend SNF level rehab, however unlikely he will qualify so PT will see frequently to progress his safe mobility.    Follow Up Recommendations  SNF     Equipment Recommendations  Other (comment)(TBD at next venue)    Recommendations for Other Services OT consult     Precautions / Restrictions Precautions Precautions: None Restrictions Weight Bearing Restrictions: No    Mobility  Bed Mobility Overal bed mobility: Needs Assistance Bed Mobility: Supine to Sit;Sit to Supine     Supine to sit: Min guard     General bed mobility comments: min guard for safety, decreased trunk flexion due to increased abdominal swelling, able to bring self to side of bed with increased pull on bed rail  Transfers Overall transfer level: Needs assistance Equipment used: Rolling walker (2 wheeled) Transfers: Sit to/from Stand Sit to Stand: Min assist         General transfer comment: min A for power up, swelling in B LE has  decreased and pt now able to adduct hips enought for both LE to fit inside RW  Ambulation/Gait Ambulation/Gait assistance: Min assist Gait Distance (Feet): 70 Feet Assistive device: Rolling walker (2 wheeled) Gait Pattern/deviations: Step-through pattern;Shuffle;Wide base of support;Trunk flexed Gait velocity: slowed Gait velocity interpretation: <1.8 ft/sec, indicate of risk for recurrent falls General Gait Details: min A for steadying,pt with wide BoS, flexed posture and waddling gait due to increased LE swelling,          Balance Overall balance assessment: Needs assistance Sitting-balance support: Feet supported Sitting balance-Leahy Scale: Fair   Postural control: Posterior lean(due to swollen abdomen) Standing balance support: Bilateral upper extremity supported Standing balance-Leahy Scale: Poor Standing balance comment: requires bilateral UE support                            Cognition Arousal/Alertness: Lethargic Behavior During Therapy: Flat affect;Impulsive Overall Cognitive Status: History of cognitive impairments - at baseline                                 General Comments: Pt requires maximal tactile and verbal stimulation to rouse on entry and has difficulty staying awake for session RN administered medication prior to ambulation          General Comments General comments (skin integrity, edema, etc.): Pt continues to have edema in abdomen and thighs however is markedly decreased      Pertinent Vitals/Pain Pain Assessment:  0-10 Pain Score: 9  Pain Location: abdomen and upper legs Pain Descriptors / Indicators: Pressure;Tightness;Throbbing Pain Intervention(s): Limited activity within patient's tolerance;Monitored during session;Repositioned           PT Goals (current goals can now be found in the care plan section) Acute Rehab PT Goals Patient Stated Goal: go to a rest home PT Goal Formulation: With patient Time For Goal  Achievement: 12/02/18 Potential to Achieve Goals: Fair Progress towards PT goals: Progressing toward goals    Frequency    Min 2X/week      PT Plan Current plan remains appropriate       AM-PAC PT "6 Clicks" Mobility   Outcome Measure  Help needed turning from your back to your side while in a flat bed without using bedrails?: None Help needed moving from lying on your back to sitting on the side of a flat bed without using bedrails?: None Help needed moving to and from a bed to a chair (including a wheelchair)?: A Little Help needed standing up from a chair using your arms (e.g., wheelchair or bedside chair)?: A Little Help needed to walk in hospital room?: A Little Help needed climbing 3-5 steps with a railing? : A Lot 6 Click Score: 19    End of Session Equipment Utilized During Treatment: Gait belt Activity Tolerance: Patient limited by pain Patient left: in bed;with call bell/phone within reach;with family/visitor present(sitting EoB ) Nurse Communication: Mobility status PT Visit Diagnosis: Unsteadiness on feet (R26.81);Other abnormalities of gait and mobility (R26.89);Muscle weakness (generalized) (M62.81);Difficulty in walking, not elsewhere classified (R26.2);Pain Pain - Right/Left: (bilateral) Pain - part of body: Leg(abdomen)     Time: 3354-5625 PT Time Calculation (min) (ACUTE ONLY): 19 min  Charges:  $Gait Training: 8-22 mins                     [name] B. Beverely Risen PT, DPT Acute Rehabilitation Services Pager [phone] Office [phone]    [name] 11/19/2018, 3:08 PM

## 2018-11-19 NOTE — Evaluation (Signed)
Occupational Therapy Evaluation Patient Details Name: [name] MRN: [id number] DOB: [birth year] Today's Date: 11/19/2018    History of Present Illness 58 y.o. male with medical history significant of chronic combined CHF; OSA; schizophrenia; polysubstance abuse; HTN; homelessness; hep C; and DM presenting with worsening edema.  He is frequently seen in the ER (55 times in last 6 months) and admitted 14 times for CHF. Last hospitalization 5/9-11, left AMA. Admitted 5/19 for CHF.     Clinical Impression   Pt's PLOF is mod I for ADL and mobility. Pt is homeless, occasionally stays at a shelter - and has trouble getting around and taking care of himself due to swelling in BLE and abdomen. Pt can be hard to understand at times (mumbling type speech) Today Pt was found getting up and down on BSC. He is min guard for mobility without DME. He used the furniture in room for stability and declined use of RW throughout session despite multiple offers. He was able to perform standing peri anal care with set up, he performed sink, level grooming, and did struggle to perform LB ADL due to increased swelling. Pt will require skilled OT in the acute setting and recommend SNF level care post-acute to address decreased independence, balance and activity tolerance. Next session to focus on safety education and compensatory strategies for LB ADL.     Follow Up Recommendations  SNF;Supervision/Assistance - 24 hour    Equipment Recommendations  None recommended by OT    Recommendations for Other Services       Precautions / Restrictions Precautions Precautions: None Restrictions Weight Bearing Restrictions: No      Mobility Bed Mobility Overal bed mobility: Needs Assistance Bed Mobility: Supine to Sit;Sit to Supine     Supine to sit: Min guard     General bed mobility comments: on BSC initially and sitting EOB at end of session  Transfers Overall transfer level: Needs  assistance Equipment used: Rolling walker (2 wheeled) Transfers: Sit to/from Stand Sit to Stand: Min guard         General transfer comment: Pt found walking around the room by himself (getting up and down from [facility]) no physical assist needed for transfers, declines use of RW at all    Balance Overall balance assessment: Needs assistance Sitting-balance support: Feet supported Sitting balance-Leahy Scale: Fair   Postural control: Posterior lean(due to swollen abdomen) Standing balance support: Bilateral upper extremity supported Standing balance-Leahy Scale: Fair Standing balance comment: furniture surfs around the room - declines use of RW for support                           ADL either performed or assessed with clinical judgement   ADL Overall ADL's : Needs assistance/impaired Eating/Feeding: Independent   Grooming: Min guard;Standing;Wash/dry face;Wash/dry hands   Upper Body Bathing: Min guard;Sitting   Lower Body Bathing: Minimal assistance;Sit to/from stand   Upper Body Dressing : Set up;Sitting   Lower Body Dressing: Minimal assistance;Sit to/from stand Lower Body Dressing Details (indicate cue type and reason): assist to get socks over toes due to pain from sore on genetalia Toilet Transfer: Min guard;Ambulation Toilet Transfer Details (indicate cue type and reason): declines using RW Toileting- Clothing Manipulation and Hygiene: Set up;Sit to/from stand Toileting - Clothing Manipulation Details (indicate cue type and reason): provided with warm wash cloth for perianal care     Functional mobility during ADLs: Min guard(declines use of RW or DME -  utilized surface of furniture)       Vision Patient Visual Report: No change from baseline       Perception     Praxis      Pertinent Vitals/Pain Pain Assessment: Faces Pain Score: 9  Faces Pain Scale: Hurts whole lot Pain Location: abdomen and upper legs Pain Descriptors / Indicators:  Pressure;Tightness;Throbbing Pain Intervention(s): Monitored during session;Repositioned     Hand Dominance     Extremity/Trunk Assessment Upper Extremity Assessment Upper Extremity Assessment: Overall WFL for tasks assessed   Lower Extremity Assessment Lower Extremity Assessment: Defer to PT evaluation       Communication Communication Communication: Expressive difficulties(very mumbly and hard to understand at times)   Cognition Arousal/Alertness: Awake/alert Behavior During Therapy: Impulsive;Flat affect;Restless Overall Cognitive Status: History of cognitive impairments - at baseline                                 General Comments: Hard to understand at times, refuses to comply to bed alarms or chair alarms (RN aware) and unable to comprehend diet restrictions (irritated when OT would not bring 2 icecreams and 3 cokes)   General Comments  Pt continues to have edema in abdomen and thighs however is markedly decreased    Exercises     Shoulder Instructions      Home Living Family/patient expects to be discharged to:: Shelter/Homeless(wants to go to "rest home" but lost his FL-2) Living Arrangements: Alone                                      Prior Functioning/Environment Level of Independence: Independent                 OT Problem List: Decreased activity tolerance;Impaired balance (sitting and/or standing);Decreased safety awareness;Decreased knowledge of use of DME or AE;Cardiopulmonary status limiting activity;Obesity;Pain;Increased edema      OT Treatment/Interventions: Self-care/ADL training;Energy conservation;DME and/or AE instruction;Therapeutic activities;Patient/family education;Balance training    OT Goals(Current goals can be found in the care plan section) Acute Rehab OT Goals Patient Stated Goal: go to a rest home OT Goal Formulation: With patient Time For Goal Achievement: 12/03/18 Potential to Achieve Goals:  Fair ADL Goals Pt Will Perform Grooming: with modified independence;standing Pt Will Perform Upper Body Dressing: with modified independence;sitting Pt Will Perform Lower Body Dressing: with modified independence;sit to/from stand Pt Will Transfer to Toilet: with modified independence;ambulating Pt Will Perform Toileting - Clothing Manipulation and hygiene: with modified independence;sit to/from stand  OT Frequency: Min 1X/week   Barriers to D/C:            Co-evaluation              AM-PAC OT "6 Clicks" Daily Activity     Outcome Measure Help from another person eating meals?: None Help from another person taking care of personal grooming?: A Little Help from another person toileting, which includes using toliet, bedpan, or urinal?: A Little Help from another person bathing (including washing, rinsing, drying)?: A Little Help from another person to put on and taking off regular upper body clothing?: A Little Help from another person to put on and taking off regular lower body clothing?: A Little 6 Click Score: 19   End of Session Nurse Communication: Mobility status;Other (comment[facility])  Activity Tolerance: Patient tolerated  treatment well Patient left: in bed;with call bell/phone within reach(EOB)  OT Visit Diagnosis: Unsteadiness on feet (R26.81);Other abnormalities of gait and mobility (R26.89);Adult, failure to thrive (R62.7)                Time: 9604-5409 OT Time Calculation (min): 24 min Charges:  OT General Charges $OT Visit: 1 Visit OT Evaluation $OT Eval Moderate Complexity: 1 Mod OT Treatments $Self Care/Home Management : 8-22 mins  Sherryl Manges OTR/L Acute Rehabilitation Services Pager: [phone] Office: [phone] Evern Bio Damian Buckles 11/19/2018, 4:31 PM

## 2018-11-19 NOTE — Progress Notes (Addendum)
PROGRESS NOTE  [name] OFV:[id number] DOB: [birth year] DOA: 11/17/2018 PCP: Patient, No Pcp Per   LOS: 2 days   Brief narrative: 58 year old male with significant past medical history of paranoid schizophrenia, substance abuse, hypertension, type 2 diabetes mellitus andhepatitis C presented to the hospital with lower extremity edema, testicular swelling and generalized weakness. Patient had about 13 admissions for the last 6 months. On his initial physical examination his blood pressure was 157/89, heart rate 88, respiratory rate 19, temperature 98, oxygen saturation 91%,his lungs are clear to auscultation bilaterally, heart S1-S2 present and rhythmic, no gallops, rubs or murmurs, his abdomen was protuberant, distended, positive for plus pitting bilateral extremity edema, positive scrotal edema. Sodium 140, potassium 3.0, chloride 96, bicarb 29, glucose 310, BUN 23, creatinine 1.0, BNP 1280, white count 9.5, hemoglobin 10.6, hematocrit 36.3, platelets 283. SARSCOVID-19 negative.Tox screen positive for cocaine. Alcohol less than 10. His chest x-ray had cardiomegaly with increased hilar vascular congestion. EKG 105 bpm, normal axis, normal intervals, sinus rhythm, no ST segment changes, chronicT wave inversions in V5 to V6.Poor R wave progression. Patient was admitted to the hospital working diagnosis of acute decompensation of systolic heart failure.  Subjective: Today patient complains of feeling better with breathing but he complains of significant lower extremity edema.  Denies any chest pain palpitation.  Denies fever chills or cough.  Assessment/Plan:  Principal Problem:   Acute on chronic combined systolic and diastolic CHF (congestive heart failure) (HCC) Active Problems:   Insulin-requiring or dependent type II diabetes mellitus (HCC)   Essential hypertension, benign   Cocaine use disorder, severe, dependence (HCC)   Chronic paranoid schizophrenia (HCC)  Homelessness   Tobacco use   HLD (hyperlipidemia)  Acute on chronic decompensated systolic heart failure. Patient has significant volume overload but has started to diurese with IV Lasix.  We will continue to diurese.  Patient has high-risk for recurrent admissions due to dietary and medication noncompliance.  Currently on Lasix 40 mg IV every 8 hourly.  Continue CHF education, fluid restriction.  Net negative fluid balance of 3435 ml  Essential HTN.   Blood pressure is still accelerated.  On lisinopril 20 mg a day.  Continue Coreg.  Add hydralazine p.o. 25 mg 3 times daily.  Diabetes mellitus type II.     Continue on sliding scale insulin, Lantus..  Patient requests double portion meals.  Increase the dose of Lantus today to 16 units..  Polysubstance abuse, cocaine intoxication and tobacco abuse.   Continue nicotine patch.  No signs of withdrawal.  Schizophrenia/ paranoid.   On lorazepam, aripiprazole and carbamazepine.      Stage 2 pressure ulcer at the sacrum and right hip, present on admission, continue local wound care.   Morbid obesity.  Patient was counseled about it  Constipation.  Patient on MiraLAX and a stool softener  Homelessness.  Recurrent hospitalizations.  Social worker on board.  VTE Prophylaxis: Lovenox  Code Status: DNR  Family Communication: None  Disposition Plan: Home likely in 1 to 2 days.  Very high risk for recurrent admissions due to medication and dietary noncompliance, homelessness.  Consultants:  None  Procedures:  None  Antibiotics: Anti-infectives (From admission, onward)   None      Objective: Vitals:   11/19/18 0455 11/19/18 0944  BP: (!) 160/93 (!) 138/102  Pulse: 97 (!) 101  Resp: 18   Temp: 97.6 F (36.4 C)   SpO2: 98% 96%    Intake/Output Summary (Last 24 hours) at 11/19/2018 1034 Last  data filed at 11/19/2018 1000 Gross per 24 hour  Intake 1680 ml  Output 3775 ml  Net -2095 ml   Filed Weights   11/17/18 1526  11/18/18 0407 11/19/18 0455  Weight: 115.9 kg 114.3 kg 113.9 kg   Body mass index is 37.08 kg/m.   Physical Exam: General: Obese.  Not in obvious distress.  Alert awake communicative. HENT: Normocephalic, pupils equally reacting to light and accommodation.  No scleral pallor or icterus noted. Oral mucosa is moist.  Chest:  Clear breath sounds.  Diminished breath sounds bilaterally. No crackles or wheezes.  CVS: S1 &S2 heard. No murmur.  Regular rate and rhythm. Abdomen: Soft, nontender, nondistended.  Bowel sounds are heard.  Liver is not palpable, no abdominal mass palpated Extremities: No cyanosis, clubbing but with bilateral lower extremity pedal edema.  Peripheral pulses are palpable. Psych: Alert, awake and oriented, normal mood CNS:  No cranial nerve deficits.  Power equal in all extremities.  No sensory deficits noted.  No cerebellar signs.   Skin: Warm and dry.  No rashes noted. .  Data Review: I have personally reviewed the following laboratory data and studies,  CBC: Recent Labs  Lab 11/13/18 1023 11/13/18 1053 11/15/18 0748 11/17/18 0622 11/18/18 0023 11/19/18 0358  WBC 5.9  --  8.5 10.0 7.9 7.6  NEUTROABS 4.1  --  6.5  --  5.8  --   HGB 9.4* 11.2* 9.7* 10.0* 9.0* 9.3*  HCT 32.0* 33.0* 33.8* 34.4* 30.6* 31.7*  MCV 83.3  --  82.4 82.5 80.7 80.9  PLT 217  --  244 284 279 284   Basic Metabolic Panel: Recent Labs  Lab 11/13/18 1023 11/13/18 1053 11/15/18 0748 11/17/18 0622 11/18/18 0023 11/19/18 0358  NA 141 142 141 139 143 140  K 3.5 3.6 3.7 3.7 3.7 4.1  CL 103  --  101 100 107 102  CO2 30  --  GLUCOSE 126*  --  343* 400* 118* 232*  BUN 23*  --  22*  CREATININE 0.90  --  1.19 1.18 0.97 1.09  CALCIUM 8.7*  --  8.8* 9.0 8.8* 8.9  MG  --   --   --  2.0  --  2.1   Liver Function Tests: Recent Labs  Lab 11/15/18 0748  AST 32  ALT 19  ALKPHOS 129*  BILITOT 0.6  PROT 6.8  ALBUMIN 2.8*   No results for input(s): LIPASE, AMYLASE  in the last 168 hours. No results for input(s): AMMONIA in the last 168 hours. Cardiac Enzymes: Recent Labs  Lab 11/13/18 1023 11/15/18 0748 11/17/18 1518  TROPONINI 0.04* 0.04* 0.04*   BNP (last 3 results) Recent Labs    11/15/18 0748 11/17/18 0622 11/18/18 1103  BNP 967.3* 1,165.8* 1,146.8*    ProBNP (last 3 results) No results for input(s): PROBNP in the last 8760 hours.  CBG: Recent Labs  Lab 11/18/18 0644 11/18/18 1117 11/18/18 1609 11/18/18 2031 11/19/18 0628  GLUCAP 190* 187* 203* 181* 241*   Recent Results (from the past 240 hour(s))  SARS Coronavirus 2 (CEPHEID - Performed in [facility] hospital lab), Hosp Order     Status: None   Collection Time: 11/17/18  9:46 AM  Result Value Ref Range Status   SARS Coronavirus 2 NEGATIVE NEGATIVE Final    Comment: (NOTE) If result is NEGATIVE SARS-CoV-2 target nucleic acids are NOT DETECTED. The SARS-CoV-2 RNA is generally detectable in upper and lower  respiratory  specimens during the acute phase of infection. The lowest  concentration of SARS-CoV-2 viral copies this assay can detect is 250  copies / mL. A negative result does not preclude SARS-CoV-2 infection  and should not be used as the sole basis for treatment or other  patient management decisions.  A negative result may occur with  improper specimen collection / handling, submission of specimen other  than nasopharyngeal swab, presence of viral mutation(s) within the  areas targeted by this assay, and inadequate number of viral copies  (<250 copies / mL). A negative result must be combined with clinical  observations, patient history, and epidemiological information. If result is POSITIVE SARS-CoV-2 target nucleic acids are DETECTED. The SARS-CoV-2 RNA is generally detectable in upper and lower  respiratory specimens dur ing the acute phase of infection.  Positive  results are indicative of active infection with SARS-CoV-2.  Clinical  correlation with  patient history and other diagnostic information is  necessary to determine patient infection status.  Positive results do  not rule out bacterial infection or co-infection with other viruses. If result is PRESUMPTIVE POSTIVE SARS-CoV-2 nucleic acids MAY BE PRESENT.   A presumptive positive result was obtained on the submitted specimen  and confirmed on repeat testing.  While 2019 novel coronavirus  (SARS-CoV-2) nucleic acids may be present in the submitted sample  additional confirmatory testing may be necessary for epidemiological  and / or clinical management purposes  to differentiate between  SARS-CoV-2 and other Sarbecovirus currently known to infect humans.  If clinically indicated additional testing with an alternate test  methodology [phone] is advised. The SARS-CoV-2 RNA is generally  detectable in upper and lower respiratory sp ecimens during the acute  phase of infection. The expected result is Negative. Fact Sheet for Patients:  BoilerBrush.com.cyhttps://www.fda.gov/media/136312/download Fact Sheet for Healthcare Providers: https://pope.com/https://www.fda.gov/media/136313/download This test is not yet approved or cleared by the [place] FDA and has been authorized for detection and/or diagnosis of SARS-CoV-2 by FDA under an Emergency Use Authorization (EUA).  This EUA will remain in effect (meaning this test can be used) for the duration of the COVID-19 declaration under Section 564(b)(1) of the Act, 21 U.S.C. section 360bbb-3(b)(1), unless the authorization is terminated or revoked sooner. Performed at [facility], [address] [place], [place] [address]      Studies: No results found.  Scheduled Meds: . ARIPiprazole  10 mg Oral Daily  . aspirin EC  81 mg Oral Daily  . atorvastatin  40 mg Oral Daily  . carbamazepine  100 mg Oral BID  . docusate sodium  100 mg Oral BID  . enoxaparin (LOVENOX) injection  40 mg Subcutaneous Q24H  . furosemide  40 mg Intravenous Q8H  . insulin  aspart  0-15 Units Subcutaneous TID WC  . insulin aspart  0-5 Units Subcutaneous QHS  . insulin glargine  14 Units Subcutaneous Daily  . lisinopril  20 mg Oral Daily  . nicotine  21 mg Transdermal Daily  . polyethylene glycol  17 g Oral BID  . sodium chloride flush  3 mL Intravenous Q12H    Continuous Infusions: . sodium chloride       Joycelyn DasLaxman Jadarrius Maselli, MD  [facility] 11/19/2018

## 2018-11-19 NOTE — Plan of Care (Signed)
  Problem: Clinical Measurements: Goal: Will remain free from infection Outcome: Progressing Goal: Diagnostic test results will improve Outcome: Progressing Goal: Cardiovascular complication will be avoided Outcome: Progressing   Problem: Activity: Goal: Risk for activity intolerance will decrease Outcome: Progressing   Problem: Nutrition: Goal: Adequate nutrition will be maintained Outcome: Progressing   Problem: Elimination: Goal: Will not experience complications related to bowel motility Outcome: Progressing Goal: Will not experience complications related to urinary retention Outcome: Progressing   Problem: Safety: Goal: Ability to remain free from injury will improve Outcome: Progressing   Problem: Skin Integrity: Goal: Risk for impaired skin integrity will decrease Outcome: Progressing   Problem: Activity: Goal: Capacity to carry out activities will improve Outcome: Progressing   Problem: Cardiac: Goal: Ability to achieve and maintain adequate cardiopulmonary perfusion will improve Outcome: Progressing

## 2018-11-20 LAB — BASIC METABOLIC PANEL
Anion gap: 9 (ref 5–15)
BUN: 22 mg/dL — ABNORMAL HIGH (ref 6–20)
CO2: 31 mmol/L (ref 22–32)
Calcium: 9 mg/dL (ref 8.9–10.3)
Chloride: 102 mmol/L (ref 98–111)
Creatinine, Ser: 1.11 mg/dL (ref 0.61–1.24)
GFR calc Af Amer: >60 mL/min (ref >60)
GFR calc non Af Amer: >60 mL/min (ref >60)
Glucose, Bld: 249 mg/dL — ABNORMAL HIGH (ref 70–99)
Potassium: 3.8 mmol/L (ref 3.5–5.1)
Sodium: 142 mmol/L (ref 135–145)

## 2018-11-20 LAB — GLUCOSE, CAPILLARY
Glucose-Capillary: 248 mg/dL — ABNORMAL HIGH (ref 70–99)
Glucose-Capillary: 259 mg/dL — ABNORMAL HIGH (ref 70–99)
Glucose-Capillary: 160 mg/dL — ABNORMAL HIGH (ref 70–99)

## 2018-11-20 LAB — MAGNESIUM: Magnesium: 2 mg/dL (ref 1.7–2.4)

## 2018-11-20 NOTE — TOC Progression Note (Signed)
Transition of Care [facility]) - Progression Note    Patient Details  Name: NICKLAS MCSWEENEY MRN: [id number] Date of Birth: 02-Sep-1960  Transition of Care [facility]) CM/SW [place], [place] Phone Number: 11/20/2018, 11:25 AM  Clinical Narrative:     CSW received notification from the RN that the patient is not agreeable to SNF. CSW met with the patient at bedside. CSW introduced herself and explained her role. The patient explained that he would like to go to rehab or a long term care facility. He stated that he knew he has done wrong but knows that he has to change so that his health can improve. CSW stated that she would be agreeable to assisting the patient with trying to find a rehab facility. CSW explained that with his past substance use and past behaviors he may not be able to go to a facility. CSW stated that if that happened he may have to discharge back to a shelter and have his PCP help him get into a long term care facility.   Patient agreed that he would no longer use drugs or be combative with staff. CSW stated that she would fax the patient out to see if he was able to get a bed offer anywhere. CSW stated again that if he was not able to find a short term facility he would have to discharge to a shelter and he was agreeable.   CSW will continue to assist to find a facility for the patient.   Expected Discharge Plan: Home/Self Care Barriers to Discharge: Homeless with medical needs  Expected Discharge Plan and Services Expected Discharge Plan: Home/Self Care   Discharge Planning Services: CM Consult Post Acute Care Choice: NA Living arrangements for the past 2 months: Homeless Shelter                 DME Arranged: N/A DME Agency: NA       HH Arranged: NA HH Agency: NA         Social Determinants of Health (SDOH) Interventions    Readmission Risk Interventions Readmission Risk Prevention Plan 10/05/2018  Transportation Screening Complete  Medication  Review Press photographer) Complete  PCP or Specialist appointment within 3-5 days of discharge Complete  HRI or Home Care Consult Patient refused  SW Recovery Care/Counseling Consult Complete  [place] Not Applicable  Some recent data might be hidden

## 2018-11-20 NOTE — Progress Notes (Signed)
Physical Therapy Treatment Patient Details Name: [name] MRN: [id number] DOB: 1961/01/14 Today's Date: 11/20/2018    History of Present Illness 58 y.o. male with medical history significant of chronic combined CHF; OSA; schizophrenia; polysubstance abuse; HTN; homelessness; hep C; and DM presenting with worsening edema.  He is frequently seen in the ER (55 times in last 6 months) and admitted 14 times for CHF. Last hospitalization 5/9-11, left AMA. Admitted 5/19 for CHF.      PT Comments    Pt lethargic on arrival with eyes opening and closing with increased time to get pt to maintain arousal with eyes open. Pt with increased gait tolerance and recognition of reliance on RW for increased safety with gait. Pt continues asking for milk and crackers despite having just cleaned breakfast tray and MD education on importance of fluid restriction. Pt also incontinent of small BM during gait this session. Pt with continued edema of trunk and legs limiting ROM with pt unable to figure four sit to don socks.     Follow Up Recommendations  SNF;Supervision for mobility/OOB     Equipment Recommendations  Rolling walker with 5" wheels    Recommendations for Other Services       Precautions / Restrictions Precautions Precautions: Fall    Mobility  Bed Mobility Overal bed mobility: Needs Assistance Bed Mobility: Supine to Sit     Supine to sit: Min guard     General bed mobility comments: increased time with cues to initiate to transition from supine to sitting  Transfers Overall transfer level: Needs assistance   Transfers: Sit to/from Stand Sit to Stand: Min guard         General transfer comment: guarding for safety with cues for positioning.   Ambulation/Gait Ambulation/Gait assistance: Min guard Gait Distance (Feet): 300 Feet Assistive device: Rolling walker (2 wheeled) Gait Pattern/deviations: Step-through pattern;Decreased stride length;Trunk flexed;Wide base  of support   Gait velocity interpretation: 1.31 - 2.62 ft/sec, indicative of limited community ambulator General Gait Details: pt with cues for position in RW and posture, pt initially maintaining feet posterior to RW but able to step into RW with cues. Directional cues despite pt being able to recall room number   Stairs             Wheelchair Mobility    Modified Rankin (Stroke Patients Only)       Balance Overall balance assessment: Needs assistance   Sitting balance-Leahy Scale: Good     Standing balance support: Single extremity supported;Bilateral upper extremity supported Standing balance-Leahy Scale: Poor Standing balance comment: used furiture or RW for support in standing                            Cognition Arousal/Alertness: Awake/alert Behavior During Therapy: Flat affect Overall Cognitive Status: Impaired/Different from baseline Area of Impairment: Safety/judgement;Orientation                 Orientation Level: Time       Safety/Judgement: Decreased awareness of safety     General Comments: pt continues to ask for fluids despite having just been educated by MD on restrictions, edema and heart failure, pt incontinent of BM at end of gait, unaware of BM on gown      Exercises General Exercises - Lower Extremity Long Arc Quad: AROM;Both;Seated;15 reps Hip Flexion/Marching: AROM;15 reps;Seated;Both    General Comments        Pertinent Vitals/Pain Pain Score: 5  Pain Location: abdomen and upper legs Pain Descriptors / Indicators: Pressure;Tightness;Throbbing Pain Intervention(s): Limited activity within patient's tolerance;Monitored during session;Repositioned    Home Living                      Prior Function            PT Goals (current goals can now be found in the care plan section) Progress towards PT goals: Progressing toward goals    Frequency    Min 2X/week      PT Plan Current plan remains  appropriate    Co-evaluation              AM-PAC PT "6 Clicks" Mobility   Outcome Measure  Help needed turning from your back to your side while in a flat bed without using bedrails?: A Little Help needed moving from lying on your back to sitting on the side of a flat bed without using bedrails?: A Little Help needed moving to and from a bed to a chair (including a wheelchair)?: A Little Help needed standing up from a chair using your arms (e.g., wheelchair or bedside chair)?: A Little Help needed to walk in hospital room?: A Little Help needed climbing 3-5 steps with a railing? : A Lot 6 Click Score: 17    End of Session   Activity Tolerance: Patient tolerated treatment well Patient left: in chair;with call bell/phone within reach;with chair alarm set Nurse Communication: Mobility status PT Visit Diagnosis: Unsteadiness on feet (R26.81);Other abnormalities of gait and mobility (R26.89);Muscle weakness (generalized) (M62.81);Difficulty in walking, not elsewhere classified (R26.2);Pain     Time: 5003-7048 PT Time Calculation (min) (ACUTE ONLY): 20 min  Charges:  $Gait Training: 8-22 mins                     Ellery Tash Abner Greenspan, PT Acute Rehabilitation Services Pager: [phone] Office: [phone]    Enedina Finner Avier Jech 11/20/2018, 11:54 AM

## 2018-11-20 NOTE — [place] FL2 (Signed)
[place] MEDICAID FL2 LEVEL OF CARE SCREENING TOOL     IDENTIFICATION  Patient Name: Taylor MemosFrederick L Hird Birthdate: [birth year] Sex: male Admission Date (Current Location): 11/17/2018  [place]  [place] [id number] Facility and Address:  [facility], [address], [place], [place] [address]      Provider Number: [id number]  Attending Physician Name and Address:  Joycelyn DasPokhrel, Laxman, MD  Relative Name and Phone Number:  Albertina Senegalionne Gillen, Nurse with [place] [facility], [phone]    Current Level of Care: Hospital Recommended Level of Care: Skilled Nursing Facility Prior Approval Number: Under Manual Review  Date Approved/Denied:   PASRR Number:    Discharge Plan: SNF    Current Diagnoses: Patient Active Problem List   Diagnosis Date Noted  . Pressure injury of skin 11/08/2018  . Elevated troponin 10/18/2018  . HLD (hyperlipidemia) 10/18/2018  . Anxiety 10/18/2018  . CHF (congestive heart failure), NYHA class II, acute on chronic, combined (HCC) 10/18/2018  . Hypertensive urgency 10/12/2018  . Mild renal insufficiency 10/12/2018  . Cellulitis 10/12/2018  . Penile cellulitis   . Adjustment disorder with mixed disturbance of emotions and conduct   . Sleep apnea 10/03/2018  . Hypokalemia 09/17/2018  . Scrotal swelling 09/17/2018  . Urinary hesitancy   . Constipation   . Acute on chronic combined systolic and diastolic CHF (congestive heart failure) (HCC) 08/25/2018  . Acute respiratory failure with hypoxia (HCC) 08/25/2018  . Tobacco use 08/18/2018  . Homelessness 08/08/2018  . Smoker 08/08/2018  . Prostate enlargement 03/16/2018  . Aortic atherosclerosis (HCC) 03/16/2018  . 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 Chronic foot pain   . Schizoaffective disorder, bipolar type (HCC) 09/30/2016  . Substance induced mood disorder (HCC) 03/13/2015  . Acute kidney failure (HCC) 01/26/2015  . Schizophrenia, paranoid  type (HCC) 01/17/2015  . Drug hallucinosis (HCC) 10/08/2014  . Chronic paranoid schizophrenia (HCC) 09/07/2014  . 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 Urinary retention   . Cocaine use disorder, severe, dependence (HCC)   . Essential hypertension, benign 03/28/2013  . Insulin-requiring or dependent type II diabetes mellitus (HCC) 03/15/2013    Orientation RESPIRATION BLADDER Height & Weight     Self, Situation, Place  Normal Incontinent, External catheter Weight: 247 lb 1.6 oz (112.1 kg) Height:  5\' 9"  (175.3 cm)  BEHAVIORAL SYMPTOMS/MOOD NEUROLOGICAL BOWEL NUTRITION STATUS      Continent Diet(Heart Healthy/Carb Mod, thin liquids)  AMBULATORY STATUS COMMUNICATION OF NEEDS Skin   Limited Assist   (dry skin, cracking on heel, pressure injury on sacrum, hip, MASD on buttocks)                       Personal Care Assistance Level of Assistance  Bathing, Feeding, Dressing, Total care Bathing Assistance: Limited assistance Feeding assistance: Independent Dressing Assistance: Limited assistance Total Care Assistance: Limited assistance   Functional Limitations Info  Sight, Hearing, Speech Sight Info: Adequate Hearing Info: Adequate Speech Info: Adequate    SPECIAL CARE FACTORS FREQUENCY  PT (By licensed PT), OT (By licensed OT)     PT Frequency: 5x/wk OT Frequency: 5x/wk            Contractures Contractures Info: Not present    Additional Factors Info  Code Status, Allergies, Psychotropic, Insulin Sliding Scale Code Status Info: DNR Allergies Info: Haldol Haloperidol Psychotropic Info: lorazepam 1mg  every 6 hours & carbamezepine 100mg  2x daily  Insulin Sliding Scale Info: insulin aspart novlog 0-5 units daily at bedtime; insulin aspart novolog 0-15 units 3x daily w/meals; insulin glargine (lantus) 16 units       Current Medications (11/20/2018):  This is the current hospital active medication  list Current Facility-Administered Medications  Medication Dose Route Frequency Provider Last Rate Last Dose  . 0.9 %  sodium chloride infusion  250 mL Intravenous PRN Jonah Blue, MD      . acetaminophen (TYLENOL) tablet 650 mg  650 mg Oral Q4H PRN Jonah Blue, MD   650 mg at 11/19/18 1841  . ARIPiprazole (ABILIFY) tablet 10 mg  10 mg Oral Daily Jonah Blue, MD   10 mg at 11/20/18 1000  . aspirin EC tablet 81 mg  81 mg Oral Daily Jonah Blue, MD   81 mg at 11/20/18 1006  . atorvastatin (LIPITOR) tablet 40 mg  40 mg Oral Daily Jonah Blue, MD   40 mg at 11/20/18 1006  . carbamazepine (TEGRETOL) tablet 100 mg  100 mg Oral BID Jonah Blue, MD   100 mg at 11/20/18 1008  . docusate sodium (COLACE) capsule 100 mg  100 mg Oral BID Pokhrel, Laxman, MD   100 mg at 11/20/18 1000  . enoxaparin (LOVENOX) injection 40 mg  40 mg Subcutaneous Q24H Jonah Blue, MD   40 mg at 11/19/18 1803  . furosemide (LASIX) injection 40 mg  40 mg Intravenous Bobette Mo, MD   40 mg at 11/20/18 0533  . hydrALAZINE (APRESOLINE) injection 10 mg  10 mg Intravenous Q6H PRN Pokhrel, Laxman, MD      . hydrALAZINE (APRESOLINE) tablet 25 mg  25 mg Oral Q8H Pokhrel, Laxman, MD   25 mg at 11/20/18 0533  . insulin aspart (novoLOG) injection 0-15 Units  0-15 Units Subcutaneous TID WC Jonah Blue, MD   5 Units at 11/20/18 [phone]  . insulin aspart (novoLOG) injection 0-5 Units  0-5 Units Subcutaneous QHS Jonah Blue, MD      . insulin glargine (LANTUS) injection 16 Units  16 Units Subcutaneous Daily Pokhrel, Laxman, MD   16 Units at 11/20/18 1008  . lisinopril (ZESTRIL) tablet 20 mg  20 mg Oral Daily Pokhrel, Laxman, MD   20 mg at 11/20/18 1000  . LORazepam (ATIVAN) tablet 1 mg  1 mg Oral Q6H PRN Pokhrel, Laxman, MD   1 mg at 11/19/18 0447  . nicotine (NICODERM CQ - dosed in mg/24 hours) patch 21 mg  21 mg Transdermal Daily Jonah Blue, MD   21 mg at 11/20/18 1011  . ondansetron (ZOFRAN)  injection 4 mg  4 mg Intravenous Q6H PRN Jonah Blue, MD      . polyethylene glycol (MIRALAX / GLYCOLAX) packet 17 g  17 g Oral BID Jonah Blue, MD   17 g at 11/20/18 1007  . sodium chloride flush (NS) 0.9 % injection 3 mL  3 mL Intravenous Q12H Jonah Blue, MD   3 mL at 11/20/18 1011  . sodium chloride flush (NS) 0.9 % injection 3 mL  3 mL Intravenous PRN Jonah Blue, MD         Discharge Medications: Please see discharge summary for a list of discharge medications.  Relevant Imaging Results:  Relevant Lab Results:   Additional Information SSN: [id number]; Plan for long term placement after SNF.   Terrion Gencarelli B Aleksi Brummet, LCSWA

## 2018-11-20 NOTE — Progress Notes (Signed)
PROGRESS NOTE  [name] ZOX:[id number] DOB: [birth year] DOA: 11/17/2018 PCP: Patient, No Pcp Per   LOS: 3 days   Brief narrative: 58 year old male with significant past medical history of paranoid schizophrenia, substance abuse, hypertension, type 2 diabetes mellitus andhepatitis C presented to the hospital with lower extremity edema, testicular swelling and generalized weakness. Patient had about 13 admissions for the last 6 months. On his initial physical examination his blood pressure was 157/89, heart rate 88, respiratory rate 19, temperature 98, oxygen saturation 91%,his lungs are clear to auscultation bilaterally, heart S1-S2 present and rhythmic, no gallops, rubs or murmurs, his abdomen was protuberant, distended, positive for plus pitting bilateral extremity edema, positive scrotal edema. Sodium 140, potassium 3.0, chloride 96, bicarb 29, glucose 310, BUN 23, creatinine 1.0, BNP 1280, white count 9.5, hemoglobin 10.6, hematocrit 36.3, platelets 283. SARSCOVID-19 negative.Tox screen positive for cocaine. Alcohol less than 10. His chest x-ray had cardiomegaly with increased hilar vascular congestion. EKG 105 bpm, normal axis, normal intervals, sinus rhythm, no ST segment changes, chronicT wave inversions in V5 to V6.Poor R wave progression. Patient was admitted to the hospital working diagnosis of acute decompensation of systolic heart failure.  Subjective: Today, patient states that he feels overall better.  He has been diuresing well.  Still has significant swelling.  He wishes to be discharged to a skilled nursing facility.  Assessment/Plan:  Principal Problem:   Acute on chronic combined systolic and diastolic CHF (congestive heart failure) (HCC) Active Problems:   Insulin-requiring or dependent type II diabetes mellitus (HCC)   Essential hypertension, benign   Cocaine use disorder, severe, dependence (HCC)   Chronic paranoid schizophrenia (HCC)   Homelessness   Tobacco use   HLD (hyperlipidemia)  Acute on chronic decompensated systolic heart failure.   Improving. significant volume overload on presentation which has started to improve with IV diuresis. Patient has high-risk for recurrent admissions due to dietary and medication noncompliance.  Currently on Lasix 40 mg IV every 8 hourly.  We will continue for now.  continue CHF education, fluid restriction.  Net negative fluid balance of 6445 ml and a documented 3 kg weight loss.  Essential HTN.     On lisinopril 20 mg a day.  Continue Coreg.  Adde hydralazine p.o. 25 mg 3 times daily yesterday with improvement in his blood pressure..  Diabetes mellitus type II.     Continue on sliding scale insulin, Lantus.  Diabetic diet.  Closely monitor.  Polysubstance abuse, cocaine intoxication and tobacco abuse.   Continue nicotine patch.  No signs of withdrawal.  Schizophrenia/ paranoid.   On lorazepam, aripiprazole and carbamazepine.      Stage 2 pressure ulcer at the sacrum and right hip, present on admission, continue local wound care.   Morbid obesity.  Patient was counseled about it  Constipation.  on MiraLAX and a stool softener  Homelessness.  Recurrent hospitalizations.  Social worker on board regarding disposition..  VTE Prophylaxis: Lovenox  Code Status: DNR  Family Communication: None  Disposition Plan:   Very high risk for recurrent admissions due to medication and dietary noncompliance, homelessness.  Patient has been seen by physical therapy who recommend skilled nursing facility placement.Social worker is on board regarding disposition.  Consultants:  None  Procedures:  None  Antibiotics: Anti-infectives (From admission, onward)   None      Objective: Vitals:   11/20/18 1000 11/20/18 1142  BP: (!) 145/92 (!) 148/88  Pulse:  (!) 103  Resp:  20  Temp:  99.6 F (37.6 C)  SpO2:  (!) 88%    Intake/Output Summary (Last 24 hours) at 11/20/2018 1426 Last data filed  at 11/20/2018 1339 Gross per 24 hour  Intake 2505 ml  Output 4050 ml  Net -1545 ml   Filed Weights   11/18/18 0407 11/19/18 0455 11/20/18 0536  Weight: 114.3 kg 113.9 kg 112.1 kg   Body mass index is 36.49 kg/m.   Physical Exam: General: Obese built, not in obvious distress HENT: Normocephalic, pupils equally reacting to light and accommodation.  No scleral pallor or icterus noted. Oral mucosa is moist.  Chest:  Clear breath sounds.  Diminished breath sounds bilaterally. No crackles or wheezes.  CVS: S1 &S2 heard. No murmur.  Regular rate and rhythm. Abdomen: Soft, nontender, nondistended.  Bowel sounds are heard.  Liver is not palpable, no abdominal mass palpated Extremities: No cyanosis, clubbing but bilateral lower extremity pitting pedal edema noted..  Peripheral pulses are palpable. Psych: Alert, awake and oriented, CNS:  No cranial nerve deficits.  Power equal in all extremities.  No sensory deficits noted.  No cerebellar signs.   Skin: Warm and dry.  No rashes noted.   Data Review: I have personally reviewed the following laboratory data and studies,  CBC: Recent Labs  Lab 11/15/18 0748 11/17/18 0622 11/18/18 0023 11/19/18 0358  WBC 8.5 10.0 7.9 7.6  NEUTROABS 6.5  --  5.8  --   HGB 9.7* 10.0* 9.0* 9.3*  HCT 33.8* 34.4* 30.6* 31.7*  MCV 82.4 82.5 80.7 80.9  PLT 244 284 279 284   Basic Metabolic Panel: Recent Labs  Lab 11/15/18 0748 11/17/18 0622 11/18/18 0023 11/19/18 0358 11/20/18 0425  NA 141 139 143 140 142  K 3.7 3.7 3.7 4.1 3.8  CL 101 100 107 102 102  CO2 28 26 31 31 31   GLUCOSE 343* 400* 118* 232* 249*  BUN 18 17 17  22* 22*  CREATININE 1.19 1.18 0.97 1.09 1.11  CALCIUM 8.8* 9.0 8.8* 8.9 9.0  MG  --  2.0  --  2.1 2.0   Liver Function Tests: Recent Labs  Lab 11/15/18 0748  AST 32  ALT 19  ALKPHOS 129*  BILITOT 0.6  PROT 6.8  ALBUMIN 2.8*   No results for input(s): LIPASE, AMYLASE in the last 168 hours. No results for input(s): AMMONIA  in the last 168 hours. Cardiac Enzymes: Recent Labs  Lab 11/15/18 0748 11/17/18 1518  TROPONINI 0.04* 0.04*   BNP (last 3 results) Recent Labs    11/15/18 0748 11/17/18 0622 11/18/18 1103  BNP 967.3* 1,165.8* 1,146.8*    ProBNP (last 3 results) No results for input(s): PROBNP in the last 8760 hours.  CBG: Recent Labs  Lab 11/19/18 1126 11/19/18 1609 11/19/18 2059 11/20/18 0612 11/20/18 1144  GLUCAP 193* 206* 185* 248* 259*   Recent Results (from the past 240 hour(s))  SARS Coronavirus 2 (CEPHEID - Performed in [facility] hospital lab), Hosp Order     Status: None   Collection Time: 11/17/18  9:46 AM  Result Value Ref Range Status   SARS Coronavirus 2 NEGATIVE NEGATIVE Final    Comment: (NOTE) If result is NEGATIVE SARS-CoV-2 target nucleic acids are NOT DETECTED. The SARS-CoV-2 RNA is generally detectable in upper and lower  respiratory specimens during the acute phase of infection. The lowest  concentration of SARS-CoV-2 viral copies this assay can detect is 250  copies / mL. A negative result does not preclude SARS-CoV-2 infection  and should not be  used as the sole basis for treatment or other  patient management decisions.  A negative result may occur with  improper specimen collection / handling, submission of specimen other  than nasopharyngeal swab, presence of viral mutation(s) within the  areas targeted by this assay, and inadequate number of viral copies  (<250 copies / mL). A negative result must be combined with clinical  observations, patient history, and epidemiological information. If result is POSITIVE SARS-CoV-2 target nucleic acids are DETECTED. The SARS-CoV-2 RNA is generally detectable in upper and lower  respiratory specimens dur ing the acute phase of infection.  Positive  results are indicative of active infection with SARS-CoV-2.  Clinical  correlation with patient history and other diagnostic information is  necessary to determine  patient infection status.  Positive results do  not rule out bacterial infection or co-infection with other viruses. If result is PRESUMPTIVE POSTIVE SARS-CoV-2 nucleic acids MAY BE PRESENT.   A presumptive positive result was obtained on the submitted specimen  and confirmed on repeat testing.  While 2019 novel coronavirus  (SARS-CoV-2) nucleic acids may be present in the submitted sample  additional confirmatory testing may be necessary for epidemiological  and / or clinical management purposes  to differentiate between  SARS-CoV-2 and other Sarbecovirus currently known to infect humans.  If clinically indicated additional testing with an alternate test  methodology [phone]([id number]) is advised. The SARS-CoV-2 RNA is generally  detectable in upper and lower respiratory sp ecimens during the acute  phase of infection. The expected result is Negative. Fact Sheet for Patients:  BoilerBrush.com.cyhttps://www.fda.gov/media/136312/download Fact Sheet for Healthcare Providers: https://pope.com/https://www.fda.gov/media/136313/download This test is not yet approved or cleared by the [place] FDA and has been authorized for detection and/or diagnosis of SARS-CoV-2 by FDA under an Emergency Use Authorization (EUA).  This EUA will remain in effect (meaning this test can be used) for the duration of the COVID-19 declaration under Section 564(b)(1) of the Act, 21 U.S.C. section 360bbb-3(b)(1), unless the authorization is terminated or revoked sooner. Performed at [facility], [address] [place], [place] [address]      Studies: No results found.  Scheduled Meds: . ARIPiprazole  10 mg Oral Daily  . aspirin EC  81 mg Oral Daily  . atorvastatin  40 mg Oral Daily  . carbamazepine  100 mg Oral BID  . docusate sodium  100 mg Oral BID  . enoxaparin (LOVENOX) injection  40 mg Subcutaneous Q24H  . furosemide  40 mg Intravenous Q8H  . hydrALAZINE  25 mg Oral Q8H  . insulin aspart  0-15 Units Subcutaneous TID WC  .  insulin aspart  0-5 Units Subcutaneous QHS  . insulin glargine  16 Units Subcutaneous Daily  . lisinopril  20 mg Oral Daily  . nicotine  21 mg Transdermal Daily  . polyethylene glycol  17 g Oral BID  . sodium chloride flush  3 mL Intravenous Q12H    Continuous Infusions: . sodium chloride       Joycelyn DasLaxman Dereka Lueras, MD  [facility] 11/20/2018

## 2018-11-20 NOTE — Progress Notes (Signed)
Patient is signing himself out AMA.  DR Pokhrel notified via text page.

## 2018-11-20 NOTE — Progress Notes (Signed)
CSW spoke with Chiropodist, Gretta Cool, about the patient. Plan is for the CSW to fax the patient out to SNF's to see if we are able to find a long-term care placement for him.   CSW will continue to follow.   Drucilla Schmidt, MSW, LCSW-A Clinical Social Worker [employer]

## 2018-11-20 NOTE — Discharge Summary (Signed)
Physician Discharge Summary  [name] ZOX:[id number] DOB: Sep 11, 1960 DOA: 11/17/2018  PCP: Patient, No Pcp Per  Admit date: 11/17/2018 Discharge date: 11/20/2018  Admitted From: Home  Discharge disposition: Left AMA   Recommendations for Outpatient Follow-Up:   Left AMA   Discharge Diagnosis:   Principal Problem:   Acute on chronic combined systolic and diastolic CHF (congestive heart failure) (HCC) Active Problems:   Insulin-requiring or dependent type II diabetes mellitus (HCC)   Essential hypertension, benign   Cocaine use disorder, severe, dependence (HCC)   Chronic paranoid schizophrenia (HCC)   Homelessness   Tobacco use   HLD (hyperlipidemia)    Discharge Condition: Improved but Left AMA  Diet recommendation: Low sodium, heart healthy.    Wound care: None.  Code status: Full.   History of Present Illness:   58 year old male with significant past medical history of paranoid schizophrenia, substance abuse, hypertension, type 2 diabetes mellitus andhepatitis C presented to the hospital with lower extremity edema, testicular swelling and generalized weakness. Patient had about 13 admissions for the last 6 months. On his initial physical examination his blood pressure was 157/89, heart rate 88, respiratory rate 19, temperature 98, oxygen saturation 91%,his lungs are clear to auscultation bilaterally, heart S1-S2 present and rhythmic, no gallops, rubs or murmurs, his abdomen was protuberant, distended, positive for plus pitting bilateral extremity edema, positive scrotal edema. Sodium 140, potassium 3.0, chloride 96, bicarb 29, glucose 310, BUN 23, creatinine 1.0, BNP 1280, white count 9.5, hemoglobin 10.6, hematocrit 36.3, platelets 283. SARSCOVID-19 negative.Tox screen positive for cocaine. Alcohol less than 10. His chest x-ray had cardiomegaly with increased hilar vascular congestion. EKG 105 bpm, normal axis, normal intervals, sinus rhythm, no ST  segment changes, chronicT wave inversions in V5 to V6.Poor R wave progression. Patient was admitted to the hospital working diagnosis of acute decompensation of systolic heart failure.   Hospital Course:   Patient was admitted and following conditions were addressed,  Acute on chronic decompensated systolic heart failure.  Improved with treatment. significant volume overload on presentation which started to improve with IV diuresis. Patient has high-risk for recurrent admissions due to dietary and medication noncompliance.  He had net negative fluid balance of 6445 ml and a documented 3 kg weight loss.  Patient was advised to continue diuresis in the hospital but left AGAINST MEDICAL ADVICE.  Essential HTN.      Patient was advised to continue taking his medicines on discharge  Diabetes mellitus type II.      Received sliding scale insulin, Lantus.  Diabetic diet.  Polysubstance abuse, cocaine intoxication and tobacco abuse.    He was given nicotine patch.  No signs of withdrawal was noted during hospitalization .  Schizophrenia/ paranoid.  On lorazepam, aripiprazole and carbamazepine.    Was advised to continue  Stage 2 pressure ulcer at the sacrum and right hip, present on admission.  Serial  Homelessness.  Recurrent hospitalizations.    Patient left AGAINST MEDICAL ADVICE despite social worker trying to work on placement.  Disposition : Left AMA.  Very high risk for recurrent admissions due to medication and dietary noncompliance, homelessness.     Medical Consultants:    None.   Subjective:   Today, patient feels ok, wishes to go home.  Discharge Exam:   Vitals:   11/20/18 1000 11/20/18 1142  BP: (!) 145/92 (!) 148/88  Pulse:  (!) 103  Resp:  20  Temp:  99.6 F (37.6 C)  SpO2:  (!) 88%  Vitals:   11/20/18 0531 11/20/18 0536 11/20/18 1000 11/20/18 1142  BP: (!) 154/94  (!) 145/92 (!) 148/88  Pulse: 98   (!) 103  Resp: 18   20  Temp:    99.6 F (37.6  C)  TempSrc:    Oral  SpO2: 93%   (!) 88%  Weight:  112.1 kg    Height:       General: Obese built, not in obvious distress HENT: Normocephalic, pupils equally reacting to light and accommodation.  No scleral pallor or icterus noted. Oral mucosa is moist.  Chest:  Clear breath sounds.  Diminished breath sounds bilaterally. No crackles or wheezes.  CVS: S1 &S2 heard. No murmur.  Regular rate and rhythm. Abdomen: Soft, nontender, nondistended.  Bowel sounds are heard.  Liver is not palpable, no abdominal mass palpated Extremities: No cyanosis, clubbing but bilateral lower extremity pitting pedal edema noted..  Peripheral pulses are palpable. Psych: Alert, awake and oriented, CNS:  No cranial nerve deficits.  Power equal in all extremities.  No sensory deficits noted.  No cerebellar signs.   Skin: Warm and dry.  No rashes noted.    Procedures:     None  The results of significant diagnostics from this hospitalization (including imaging, microbiology, ancillary and laboratory) are listed below for reference.     Diagnostic Studies:   Dg Abdomen 1 View  Result Date: 11/21/2018 CLINICAL DATA:  58 year old male with history of abdominal pain and swelling. Constipation. EXAM: ABDOMEN - 1 VIEW COMPARISON:  Abdominal radiograph 07/18/2018. FINDINGS: Gas and stool are noted throughout the colon. Relatively large volume of stool in the region of the cecum and ascending colon. Paucity of distal rectal gas and stool. No pathologic dilatation of small bowel. No gross pneumoperitoneum noted on today's supine images. IMPRESSION: 1. Nonspecific nonobstructive bowel gas pattern, as above. Large volume of stool in the cecum and proximal ascending colon compatible with the reported clinical history of constipation. Electronically Signed   By: Trudie Reed M.D.   On: 11/21/2018 05:52     Labs:   Basic Metabolic Panel: Recent Labs  Lab 11/17/18 0622 11/18/18 0023 11/19/18 0358 11/20/18 0425  11/21/18 0450  NA 139 143 140 142 140  K 3.7 3.7 4.1 3.8 3.2*  CL 100 107 102 102 101  CO2 26 31 31 31  32  GLUCOSE 400* 118* 232* 249* 189*  BUN 17 17 22* 22* 23*  CREATININE 1.18 0.97 1.09 1.11 1.03  CALCIUM 9.0 8.8* 8.9 9.0 8.5*  MG 2.0  --  2.1 2.0 2.2   GFR Estimated Creatinine Clearance: 97.7 mL/min (by C-G formula based on SCr of 1.03 mg/dL). Liver Function Tests: No results for input(s): AST, ALT, ALKPHOS, BILITOT, PROT, ALBUMIN in the last 168 hours. No results for input(s): LIPASE, AMYLASE in the last 168 hours. No results for input(s): AMMONIA in the last 168 hours. Coagulation profile No results for input(s): INR, PROTIME in the last 168 hours.  CBC: Recent Labs  Lab 11/17/18 0622 11/18/18 0023 11/19/18 0358 11/21/18 0450  WBC 10.0 7.9 7.6 10.2  NEUTROABS  --  5.8  --  8.3*  HGB 10.0* 9.0* 9.3* 9.2*  HCT 34.4* 30.6* 31.7* 31.4*  MCV 82.5 80.7 80.9 82.4  PLT 284 279 284 274   Cardiac Enzymes: Recent Labs  Lab 11/17/18 1518  TROPONINI 0.04*   BNP: Invalid input(s): POCBNP CBG: Recent Labs  Lab 11/19/18 1609 11/19/18 2059 11/20/18 0612 11/20/18 1144 11/20/18 1634  GLUCAP 206*  185* 248* 259* 160*   D-Dimer No results for input(s): DDIMER in the last 72 hours. Hgb A1c No results for input(s): HGBA1C in the last 72 hours. Lipid Profile No results for input(s): CHOL, HDL, LDLCALC, TRIG, CHOLHDL, LDLDIRECT in the last 72 hours. Thyroid function studies No results for input(s): TSH, T4TOTAL, T3FREE, THYROIDAB in the last 72 hours.  Invalid input(s): FREET3 Anemia work up No results for input(s): VITAMINB12, FOLATE, FERRITIN, TIBC, IRON, RETICCTPCT in the last 72 hours. Microbiology Recent Results (from the past 240 hour(s))  SARS Coronavirus 2 (CEPHEID - Performed in [facility] hospital lab), Hosp Order     Status: None   Collection Time: 11/17/18  9:46 AM  Result Value Ref Range Status   SARS Coronavirus 2 NEGATIVE NEGATIVE Final    Comment:  (NOTE) If result is NEGATIVE SARS-CoV-2 target nucleic acids are NOT DETECTED. The SARS-CoV-2 RNA is generally detectable in upper and lower  respiratory specimens during the acute phase of infection. The lowest  concentration of SARS-CoV-2 viral copies this assay can detect is 250  copies / mL. A negative result does not preclude SARS-CoV-2 infection  and should not be used as the sole basis for treatment or other  patient management decisions.  A negative result may occur with  improper specimen collection / handling, submission of specimen other  than nasopharyngeal swab, presence of viral mutation(s) within the  areas targeted by this assay, and inadequate number of viral copies  (<250 copies / mL). A negative result must be combined with clinical  observations, patient history, and epidemiological information. If result is POSITIVE SARS-CoV-2 target nucleic acids are DETECTED. The SARS-CoV-2 RNA is generally detectable in upper and lower  respiratory specimens dur ing the acute phase of infection.  Positive  results are indicative of active infection with SARS-CoV-2.  Clinical  correlation with patient history and other diagnostic information is  necessary to determine patient infection status.  Positive results do  not rule out bacterial infection or co-infection with other viruses. If result is PRESUMPTIVE POSTIVE SARS-CoV-2 nucleic acids MAY BE PRESENT.   A presumptive positive result was obtained on the submitted specimen  and confirmed on repeat testing.  While 2019 novel coronavirus  (SARS-CoV-2) nucleic acids may be present in the submitted sample  additional confirmatory testing may be necessary for epidemiological  and / or clinical management purposes  to differentiate between  SARS-CoV-2 and other Sarbecovirus currently known to infect humans.  If clinically indicated additional testing with an alternate test  methodology [phone]([id number]) is advised. The SARS-CoV-2 RNA is  generally  detectable in upper and lower respiratory sp ecimens during the acute  phase of infection. The expected result is Negative. Fact Sheet for Patients:  BoilerBrush.com.cyhttps://www.fda.gov/media/136312/download Fact Sheet for Healthcare Providers: https://pope.com/https://www.fda.gov/media/136313/download This test is not yet approved or cleared by the [place] FDA and has been authorized for detection and/or diagnosis of SARS-CoV-2 by FDA under an Emergency Use Authorization (EUA).  This EUA will remain in effect (meaning this test can be used) for the duration of the COVID-19 declaration under Section 564(b)(1) of the Act, 21 U.S.C. section 360bbb-3(b)(1), unless the authorization is terminated or revoked sooner. Performed at [facility], [address] [place], [place] [address]      Discharge Instructions:    Allergies as of 11/20/2018      Reactions   Haldol [haloperidol] Other (See Comments)   Muscle spasms, loss of voluntary movement. However, pt has taken Thorazine on  multiple occasions with no adverse effects.       Medication List    ASK your doctor about these medications   ARIPiprazole 10 MG tablet Commonly known as:  ABILIFY Take 1 tablet (10 mg total) by mouth daily.   aspirin EC 81 MG tablet Take 1 tablet (81 mg total) by mouth daily.   atorvastatin 40 MG tablet Commonly known as:  LIPITOR Take 1 tablet (40 mg total) by mouth daily.   carbamazepine 200 MG tablet Commonly known as:  TEGRETOL Take 0.5 tablets (100 mg total) by mouth 2 (two) times daily.   carvedilol 6.25 MG tablet Commonly known as:  COREG Take 1 tablet (6.25 mg total) by mouth 2 (two) times daily with a meal.   furosemide 20 MG tablet Commonly known as:  LASIX Take 3 tablets (60 mg total) by mouth daily for 30 days.   hydrocortisone 1 % Crea Commonly known as:  Procto-Pak Apply twice a day to hemorrhoids.   Insulin Glargine 100 UNIT/ML Solostar Pen Commonly known as:  LANTUS Inject 14  Units into the skin daily.   lisinopril 10 MG tablet Commonly known as:  ZESTRIL Take 1 tablet (10 mg total) by mouth daily.   polyethylene glycol 17 g packet Commonly known as:  MIRALAX / GLYCOLAX Take 17 g by mouth 2 (two) times daily.   tamsulosin 0.4 MG Caps capsule Commonly known as:  FLOMAX Take 1 capsule (0.4 mg total) by mouth daily after breakfast.         Time coordinating discharge: 39 minutes  Signed:  Quran [name]  [facility] 11/22/2018, 4:13 PM

## 2018-11-21 ENCOUNTER — Other Ambulatory Visit: Payer: Self-pay

## 2018-11-21 ENCOUNTER — Emergency Department (HOSPITAL_COMMUNITY)
Admission: EM | Admit: 2018-11-21 | Discharge: 2018-11-21 | Disposition: A | Discharge status: Home / Self Care | Payer: Medicaid Other | Attending: Emergency Medicine | Admitting: Emergency Medicine

## 2018-11-21 ENCOUNTER — Encounter (HOSPITAL_COMMUNITY): Payer: Self-pay

## 2018-11-21 ENCOUNTER — Emergency Department (HOSPITAL_COMMUNITY): Payer: Medicaid Other

## 2018-11-21 ENCOUNTER — Emergency Department (HOSPITAL_COMMUNITY)
Admission: EM | Admit: 2018-11-21 | Discharge: 2018-11-21 | Disposition: A | Discharge status: Home / Self Care | Payer: Medicaid Other | Source: Home / Self Care | Attending: Emergency Medicine | Admitting: Emergency Medicine

## 2018-11-21 DIAGNOSIS — D649 Anemia, unspecified: Secondary | ICD-10-CM | POA: Diagnosis not present

## 2018-11-21 DIAGNOSIS — I5042 Chronic combined systolic (congestive) and diastolic (congestive) heart failure: Secondary | ICD-10-CM | POA: Insufficient documentation

## 2018-11-21 DIAGNOSIS — I110 Hypertensive heart disease with heart failure: Secondary | ICD-10-CM | POA: Insufficient documentation

## 2018-11-21 DIAGNOSIS — Z5900 Homelessness unspecified: Secondary | ICD-10-CM

## 2018-11-21 DIAGNOSIS — E669 Obesity, unspecified: Secondary | ICD-10-CM | POA: Diagnosis not present

## 2018-11-21 DIAGNOSIS — E119 Type 2 diabetes mellitus without complications: Secondary | ICD-10-CM | POA: Diagnosis not present

## 2018-11-21 DIAGNOSIS — F1410 Cocaine abuse, uncomplicated: Secondary | ICD-10-CM | POA: Diagnosis not present

## 2018-11-21 DIAGNOSIS — F17210 Nicotine dependence, cigarettes, uncomplicated: Secondary | ICD-10-CM | POA: Insufficient documentation

## 2018-11-21 DIAGNOSIS — R109 Unspecified abdominal pain: Secondary | ICD-10-CM | POA: Diagnosis present

## 2018-11-21 DIAGNOSIS — Z79899 Other long term (current) drug therapy: Secondary | ICD-10-CM | POA: Insufficient documentation

## 2018-11-21 DIAGNOSIS — K591 Functional diarrhea: Secondary | ICD-10-CM | POA: Insufficient documentation

## 2018-11-21 DIAGNOSIS — E876 Hypokalemia: Secondary | ICD-10-CM | POA: Diagnosis not present

## 2018-11-21 DIAGNOSIS — Z7982 Long term (current) use of aspirin: Secondary | ICD-10-CM | POA: Diagnosis not present

## 2018-11-21 LAB — BASIC METABOLIC PANEL
Anion gap: 7 (ref 5–15)
BUN: 23 mg/dL — ABNORMAL HIGH (ref 6–20)
CO2: 32 mmol/L (ref 22–32)
Calcium: 8.5 mg/dL — ABNORMAL LOW (ref 8.9–10.3)
Chloride: 101 mmol/L (ref 98–111)
Creatinine, Ser: 1.03 mg/dL (ref 0.61–1.24)
GFR calc Af Amer: >60 mL/min (ref >60)
GFR calc non Af Amer: >60 mL/min (ref >60)
Glucose, Bld: 189 mg/dL — ABNORMAL HIGH (ref 70–99)
Potassium: 3.2 mmol/L — ABNORMAL LOW (ref 3.5–5.1)
Sodium: 140 mmol/L (ref 135–145)

## 2018-11-21 LAB — MAGNESIUM: Magnesium: 2.2 mg/dL (ref 1.7–2.4)

## 2018-11-21 LAB — CBC WITH DIFFERENTIAL/PLATELET
Abs Immature Granulocytes: 0.05 10*3/uL (ref 0.00–0.07)
Basophils Absolute: 0 10*3/uL (ref 0.0–0.1)
Basophils Relative: 0 %
Eosinophils Absolute: 0.1 10*3/uL (ref 0.0–0.5)
Eosinophils Relative: 1 %
HCT: 31.4 % — ABNORMAL LOW (ref 39.0–52.0)
Hemoglobin: 9.2 g/dL — ABNORMAL LOW (ref 13.0–17.0)
Immature Granulocytes: 1 %
Lymphocytes Relative: 7 %
Lymphs Abs: 0.8 10*3/uL (ref 0.7–4.0)
MCH: 24.1 pg — ABNORMAL LOW (ref 26.0–34.0)
MCHC: 29.3 g/dL — ABNORMAL LOW (ref 30.0–36.0)
MCV: 82.4 fL (ref 80.0–100.0)
Monocytes Absolute: 1 10*3/uL (ref 0.1–1.0)
Monocytes Relative: 9 %
Neutro Abs: 8.3 10*3/uL — ABNORMAL HIGH (ref 1.7–7.7)
Neutrophils Relative %: 82 %
Platelets: 274 10*3/uL (ref 150–400)
RBC: 3.81 MIL/uL — ABNORMAL LOW (ref 4.22–5.81)
RDW: 20.6 % — ABNORMAL HIGH (ref 11.5–15.5)
WBC: 10.2 10*3/uL (ref 4.0–10.5)
nRBC: 0 % (ref 0.0–0.2)

## 2018-11-21 MED ORDER — POTASSIUM CHLORIDE CRYS ER 20 MEQ PO TBCR
40.0000 meq | EXTENDED_RELEASE_TABLET | Freq: Once | ORAL | Status: AC
  Administered 2018-11-21: 40 meq via ORAL
  Filled 2018-11-21: qty 2

## 2018-11-21 MED ORDER — MAGNESIUM CITRATE PO SOLN
1.0000 | Freq: Once | ORAL | Status: AC
  Administered 2018-11-21: 07:00:00 1 via ORAL
  Filled 2018-11-21: qty 296

## 2018-11-21 MED ORDER — ACETAMINOPHEN 325 MG PO TABS
650.0000 mg | ORAL_TABLET | Freq: Once | ORAL | Status: AC
  Administered 2018-11-21: 15:00:00 650 mg via ORAL
  Filled 2018-11-21: qty 2

## 2018-11-21 MED ORDER — POTASSIUM CHLORIDE ER 20 MEQ PO TBCR: 20.0000 meq | EXTENDED_RELEASE_TABLET | Freq: Every day | ORAL | 1 refills | Status: DC

## 2018-11-21 NOTE — ED Provider Notes (Signed)
[facility]-EMERGENCY DEPT Provider Note   CSN: [id number] Arrival date & time: 11/21/18  1456    History   Chief Complaint Chief Complaint  Patient presents with   Pt Soiled himself    HPI BROXTON SIEVERS is a 58 y.o. male.     Patient is a 58 year old male who is well-known to the emergency room with a history of CHF, polysubstance abuse, schizophrenia, diabetes and homelessness who returns today after being discharged early this morning with abdominal pain and swelling.  Patient evaluated and deemed to be stable for discharge but was thought to have constipation and was given medication for this.  Patient states since leaving the medication has worked and he had a very large bowel movement but unfortunately was not able to get to a bathroom and had stool all over himself.  Patient went up to EMS and requested that they bathe him and they brought him here for further care.  Patient states that he feels much better and just needed some help getting cleaned up and then would like to leave.  He is denying any abdominal pain at this time and has no complaints of shortness of breath or chest pain.  The history is provided by the patient.    Past Medical History:  Diagnosis Date   Chronic foot pain    Cocaine abuse (HCC)    Diabetes mellitus without complication (HCC)    Hepatitis C    unsure    Homelessness    Hypertension    Neuropathy    Polysubstance abuse (HCC)    Schizophrenia (HCC)    Sleep apnea    Systolic and diastolic CHF, chronic (HCC)     Patient Active Problem List   Diagnosis Date Noted   Pressure injury of skin 11/08/2018   Elevated troponin 10/18/2018   HLD (hyperlipidemia) 10/18/2018   Anxiety 10/18/2018   CHF (congestive heart failure), NYHA class II, acute on chronic, combined (HCC) 10/18/2018   Hypertensive urgency 10/12/2018   Mild renal insufficiency 10/12/2018   Cellulitis 10/12/2018   Penile  cellulitis    Adjustment disorder with mixed disturbance of emotions and conduct    Sleep apnea 10/03/2018   Hypokalemia 09/17/2018   Scrotal swelling 09/17/2018   Urinary hesitancy    Constipation    Acute on chronic combined systolic and diastolic CHF (congestive heart failure) (HCC) 08/25/2018   Acute respiratory failure with hypoxia (HCC) 08/25/2018   Tobacco use 08/18/2018   Homelessness 08/08/2018   Smoker 08/08/2018   Prostate enlargement 03/16/2018   Aortic atherosclerosis (HCC) 03/16/2018   Aneurysm of abdominal aorta (HCC) 03/16/2018   Chronic foot pain    Schizoaffective disorder, bipolar type (HCC) 09/30/2016   Substance induced mood disorder (HCC) 03/13/2015   Acute kidney failure (HCC) 01/26/2015   Schizophrenia, paranoid type (HCC) 01/17/2015   Drug hallucinosis (HCC) 10/08/2014   Chronic paranoid schizophrenia (HCC) 09/07/2014   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 Authorizing Provider  acetaminophen (TYLENOL) 500 MG tablet Take 1,000 mg by  mouth every 6 (six) hours as needed for moderate pain.    [provider]  ARIPiprazole (ABILIFY) 10 MG tablet Take 1 tablet (10 mg total) by mouth daily. Patient not taking: Reported on 11/13/2018 09/18/18   Meredeth Ide, MD  aspirin EC 81 MG tablet Take 1 tablet (81 mg total) by mouth daily. Patient not taking: Reported on 11/13/2018 10/19/18 02/16/19  Meredeth Ide, MD  atorvastatin (LIPITOR) 40 MG tablet Take 1 tablet (40 mg total) by mouth  daily. Patient not taking: Reported on 11/13/2018 09/18/18   Meredeth Ide, MD  carbamazepine (TEGRETOL) 200 MG tablet Take 0.5 tablets (100 mg total) by mouth 2 (two) times daily. Patient not taking: Reported on 11/13/2018 09/18/18   Meredeth Ide, MD  carvedilol (COREG) 6.25 MG tablet Take 1 tablet (6.25 mg total) by mouth 2 (two) times daily with a meal. Patient not taking: Reported on 11/13/2018 10/19/18   Meredeth Ide, MD  furosemide (LASIX) 20 MG tablet Take 3 tablets (60 mg total) by mouth daily for 30 days. Patient not taking: Reported on 11/21/2018 11/11/18 12/11/18  Michela Pitcher A, PA-C  hydrocortisone (PROCTO-PAK) 1 % CREA Apply twice a day to hemorrhoids. Patient not taking: Reported on 11/13/2018 09/29/18   Renne Crigler, PA-C  Insulin Glargine (LANTUS) 100 UNIT/ML Solostar Pen Inject 14 Units into the skin daily. Patient not taking: Reported on 11/13/2018 09/18/18   Meredeth Ide, MD  lisinopril (PRINIVIL,ZESTRIL) 10 MG tablet Take 1 tablet (10 mg total) by mouth daily. Patient not taking: Reported on 11/13/2018 09/18/18   Meredeth Ide, MD  polyethylene glycol [facility] / GLYCOLAX) packet Take 17 g by mouth 2 (two) times daily. Patient not taking: Reported on 11/13/2018 09/18/18   Meredeth Ide, MD  Potassium Chloride ER 20 MEQ TBCR Take 20 mEq by mouth daily. Take one pill twice a day for five days, then take one pill every day 11/21/18   Dione Booze, MD  tamsulosin [facility]) 0.4 MG CAPS capsule Take 1 capsule (0.4 mg total) by mouth daily after breakfast. Patient not taking: Reported on 11/13/2018 10/05/18   Narda Bonds, MD    Family History Family History  Problem Relation Age of Onset   Hypertension Other    Diabetes Other     Social History Social History   Tobacco Use   Smoking status: Current Every Day Smoker    Packs/day: 1.00    Years: 20.00    Pack years: 20.00    Types: Cigarettes   Smokeless tobacco: Current User  Substance Use Topics   Alcohol use: Yes     Comment: Daily Drinker    Drug use: Yes    Frequency: 7.0 times per week    Types: "Crack" cocaine, Cocaine, Marijuana    Comment: used yesterday      Allergies   Haldol [haloperidol]   Review of Systems Review of Systems  All other systems reviewed and are negative.    Physical Exam Updated Vital Signs BP (!) 144/78    Pulse (!) 101    Resp 18    SpO2 94%   Physical Exam Vitals signs and nursing note reviewed.  Constitutional:      General: He is not in acute distress.    Appearance: Normal appearance. He is obese.     Comments: Stool present all over his lower body  HENT:     Head: Normocephalic.  Cardiovascular:     Rate and Rhythm: Tachycardia  present.  Pulmonary:     Effort: Pulmonary effort is normal.  Abdominal:     Palpations: Abdomen is soft.     Tenderness: There is no abdominal tenderness. There is no guarding or rebound.  Musculoskeletal:     Right lower leg: Edema present.     Left lower leg: Edema present.     Comments: Significant lower ext edema up to the thigh bilaterally  Skin:    General: Skin is warm and dry.     Capillary Refill: Capillary refill takes 2 to 3 seconds.  Neurological:     General: No focal deficit present.     Mental Status: He is alert. Mental status is at baseline.  Psychiatric:     Comments: Cooperative and calm      ED Treatments / Results  Labs (all labs ordered are listed, but only abnormal results are displayed) Labs Reviewed - No data to display  EKG None  Radiology Dg Abdomen 1 View  Result Date: 11/21/2018 CLINICAL DATA:  58 year old male with history of abdominal pain and swelling. Constipation. EXAM: ABDOMEN - 1 VIEW COMPARISON:  Abdominal radiograph 07/18/2018. FINDINGS: Gas and stool are noted throughout the colon. Relatively large volume of stool in the region of the cecum and ascending colon. Paucity of distal rectal gas and stool. No pathologic dilatation of small bowel. No gross pneumoperitoneum  noted on today's supine images. IMPRESSION: 1. Nonspecific nonobstructive bowel gas pattern, as above. Large volume of stool in the cecum and proximal ascending colon compatible with the reported clinical history of constipation. Electronically Signed   By: Trudie Reedaniel  Entrikin M.D.   On: 11/21/2018 05:52    Procedures Procedures (including critical care time)  Medications Ordered in ED Medications  acetaminophen (TYLENOL) tablet 650 mg (has no administration in time range)     Initial Impression / Assessment and Plan / ED Course  I have reviewed the triage vital signs and the nursing notes.  Pertinent labs & imaging results that were available during my care of the patient were reviewed by me and considered in my medical decision making (see chart for details).       Patient returning to the emergency room today because he received medication to help with his constipation prior to discharge early this morning and states that worked and he had a large volume stool however soiled himself everywhere.  He was requesting that someone help him clean it up.  Upon arrival here patient was given soap and water and he bathed himself and changed his clothes and states he feels much better and would like to to be discharged.  He has no abdominal pain at this time.  Patient is chronically fluid overloaded and does not take his medications this appears to be stable.  He otherwise is in no acute distress and appears to be stable for discharge.  Final Clinical Impressions(s) / ED Diagnoses   Final diagnoses:  Functional diarrhea    ED Discharge Orders    None       Gwyneth SproutPlunkett, Elese Rane, MD 11/21/18 1529

## 2018-11-21 NOTE — ED Notes (Signed)
Bed: WA21 Expected date:  Expected time:  Means of arrival:  Comments: 58 yo abd pain, diabetes

## 2018-11-21 NOTE — ED Provider Notes (Signed)
[facility]-EMERGENCY DEPT Provider Note   CSN: [id number] Arrival date & time: 11/21/18  0341    History   Chief Complaint Chief Complaint  Patient presents with  . Abdominal Pain    HPI [name] is a 58 y.o. male.   The history is provided by the patient.  Abdominal Pain  He has history of diabetes, hypertension, schizophrenia, substance abuse, combined systolic and diastolic heart failure and comes in complaining of problems with his abdomen.  He states that whenever he urinates, he also passes stool.  He denies any nausea or vomiting.  He has chronic edema which has improved.  He was recently admitted to the hospital and had left AGAINST MEDICAL ADVICE yesterday afternoon.  He does admit to cocaine use since leaving the hospital.  He states that he wants to go to a rest home and that if he got there, he would stay.  Past Medical History:  Diagnosis Date  . Chronic foot pain   . Cocaine abuse (HCC)   . Diabetes mellitus without complication (HCC)   . Hepatitis C    unsure   . Homelessness   . Hypertension   . Neuropathy   . Polysubstance abuse (HCC)   . Schizophrenia (HCC)   . Sleep apnea   . Systolic and diastolic CHF, chronic [facility])     Patient Active Problem List   Diagnosis Date Noted  . Pressure injury of skin 11/08/2018  . Elevated troponin 10/18/2018  . HLD (hyperlipidemia) 10/18/2018  . Anxiety 10/18/2018  . CHF (congestive heart failure), NYHA class II, acute on chronic, combined (HCC) 10/18/2018  . Hypertensive urgency 10/12/2018  . Mild renal insufficiency 10/12/2018  . Cellulitis 10/12/2018  . Penile cellulitis   . Adjustment disorder with mixed disturbance of emotions and conduct   . Sleep apnea 10/03/2018  . Hypokalemia 09/17/2018  . Scrotal swelling 09/17/2018  . Urinary hesitancy   . Constipation   . Acute on chronic combined systolic and diastolic CHF (congestive heart failure) (HCC) 08/25/2018  . Acute  respiratory failure with hypoxia (HCC) 08/25/2018  . Tobacco use 08/18/2018  . Homelessness 08/08/2018  . Smoker 08/08/2018  . Prostate enlargement 03/16/2018  . Aortic atherosclerosis (HCC) 03/16/2018  . 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 Chronic foot pain   . Schizoaffective disorder, bipolar type (HCC) 09/30/2016  . Substance induced mood disorder (HCC) 03/13/2015  . Acute kidney failure (HCC) 01/26/2015  . Schizophrenia, paranoid type (HCC) 01/17/2015  . Drug hallucinosis (HCC) 10/08/2014  . Chronic paranoid schizophrenia (HCC) 09/07/2014  . 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 Urinary retention   . Cocaine use disorder, severe, dependence (HCC)   . Essential hypertension, benign 03/28/2013  . 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 MULTIPLE TOOTH EXTRACTIONS          Home Medications    Prior to Admission medications   Medication Sig Start Date End Date Taking? Authorizing Provider  acetaminophen (TYLENOL) 500 MG tablet Take 1,000 mg by mouth every 6 (six) hours as needed for moderate pain.   Yes [provider]  ARIPiprazole (ABILIFY) 10 MG tablet Take 1 tablet (10 mg total) by mouth daily. Patient not taking: Reported on 11/13/2018 09/18/18   Meredeth Ide, MD  aspirin EC 81 MG tablet Take 1 tablet (81 mg total) by mouth daily.  Patient not taking: Reported on 11/13/2018 10/19/18 02/16/19  Meredeth Ide, MD  atorvastatin (LIPITOR) 40 MG tablet Take 1 tablet (40 mg total) by mouth daily. Patient not taking: Reported on 11/13/2018 09/18/18   Meredeth Ide, MD  carbamazepine (TEGRETOL) 200 MG tablet Take 0.5 tablets (100 mg total) by mouth 2 (two) times daily. Patient not taking: Reported on 11/13/2018 09/18/18   Meredeth Ide, MD  carvedilol (COREG) 6.25 MG tablet Take 1 tablet (6.25 mg total) by  mouth 2 (two) times daily with a meal. Patient not taking: Reported on 11/13/2018 10/19/18   Meredeth Ide, MD  furosemide (LASIX) 20 MG tablet Take 3 tablets (60 mg total) by mouth daily for 30 days. Patient not taking: Reported on 11/21/2018 11/11/18 12/11/18  Michela Pitcher A, PA-C  hydrocortisone (PROCTO-PAK) 1 % CREA Apply twice a day to hemorrhoids. Patient not taking: Reported on 11/13/2018 09/29/18   Renne Crigler, PA-C  Insulin Glargine (LANTUS) 100 UNIT/ML Solostar Pen Inject 14 Units into the skin daily. Patient not taking: Reported on 11/13/2018 09/18/18   Meredeth Ide, MD  lisinopril (PRINIVIL,ZESTRIL) 10 MG tablet Take 1 tablet (10 mg total) by mouth daily. Patient not taking: Reported on 11/13/2018 09/18/18   Meredeth Ide, MD  polyethylene glycol [facility] / GLYCOLAX) packet Take 17 g by mouth 2 (two) times daily. Patient not taking: Reported on 11/13/2018 09/18/18   Meredeth Ide, MD  Potassium Chloride ER 20 MEQ TBCR Take 20 mEq by mouth daily. Patient not taking: Reported on 11/13/2018 09/18/18   Meredeth Ide, MD  tamsulosin [facility]) 0.4 MG CAPS capsule Take 1 capsule (0.4 mg total) by mouth daily after breakfast. Patient not taking: Reported on 11/13/2018 10/05/18   Narda Bonds, MD    Family History Family History  Problem Relation Age of Onset  . Hypertension Other   . Diabetes Other     Social History Social History   Tobacco Use  . Smoking status: Current Every Day Smoker    Packs/day: 1.00    Years: 20.00    Pack years: 20.00    Types: Cigarettes  . Smokeless tobacco: Current User  Substance Use Topics  . Alcohol use: Yes    Comment: Daily Drinker   . Drug use: Yes    Frequency: 7.0 times per week    Types: "Crack" cocaine, Cocaine, Marijuana    Comment: used yesterday      Allergies   Haldol [haloperidol]   Review of Systems Review of Systems  Gastrointestinal: Positive for abdominal pain.  All other systems reviewed and are negative.    Physical Exam  Updated Vital Signs BP (!) 163/93   Pulse (!) 103   Temp 98.3 F (36.8 C) (Oral)   Resp 18   SpO2 97%   Physical Exam Vitals signs and nursing note reviewed.    58 year old male, resting comfortably and in no acute distress. Vital signs are significant for elevated blood pressure and borderline elevated heart rate. Oxygen saturation is 97%, which is normal. Head is normocephalic and atraumatic. PERRLA, EOMI. Oropharynx is clear. Neck is nontender and supple without adenopathy or JVD. Back is nontender and there is no CVA tenderness. Lungs are clear without rales, wheezes, or rhonchi. Chest is nontender. Heart has regular rate and rhythm without murmur. Abdomen is soft, flat, nontender without masses or hepatosplenomegaly and peristalsis is normoactive. Extremities have 2-3+ edema, full range of motion is present. Skin is warm and dry  without rash. Neurologic: Awake and alert, scattered thought processes fairly typical for this patient, cranial nerves are intact, there are no motor or sensory deficits.  ED Treatments / Results  Labs (all labs ordered are listed, but only abnormal results are displayed) Labs Reviewed  BASIC METABOLIC PANEL - Abnormal; Notable for the following components:      Result Value   Potassium 3.2 (*)    Glucose, Bld 189 (*)    BUN 23 (*)    Calcium 8.5 (*)    All other components within normal limits  CBC WITH DIFFERENTIAL/PLATELET - Abnormal; Notable for the following components:   RBC 3.81 (*)    Hemoglobin 9.2 (*)    HCT 31.4 (*)    MCH 24.1 (*)    MCHC 29.3 (*)    RDW 20.6 (*)    Neutro Abs 8.3 (*)    All other components within normal limits  MAGNESIUM    EKG EKG Interpretation  Date/Time:  Saturday Nov 21 2018 06:10:05 EDT Ventricular Rate:  101 PR Interval:    QRS Duration: 82 QT Interval:  361 QTC Calculation: 468 R Axis:   20 Text Interpretation:  Sinus tachycardia Probable left atrial enlargement Anterior infarct, old  Nonspecific T abnormalities, lateral leads Baseline wander in lead(s) III V4 When compared with ECG of 11/18/2018, No significant change was found Confirmed by Dione BoozeGlick, Chidera Thivierge ([id number]) on 11/21/2018 6:29:08 AM   Radiology Dg Abdomen 1 View  Result Date: 11/21/2018 CLINICAL DATA:  58 year old male with history of abdominal pain and swelling. Constipation. EXAM: ABDOMEN - 1 VIEW COMPARISON:  Abdominal radiograph 07/18/2018. FINDINGS: Gas and stool are noted throughout the colon. Relatively large volume of stool in the region of the cecum and ascending colon. Paucity of distal rectal gas and stool. No pathologic dilatation of small bowel. No gross pneumoperitoneum noted on today's supine images. IMPRESSION: 1. Nonspecific nonobstructive bowel gas pattern, as above. Large volume of stool in the cecum and proximal ascending colon compatible with the reported clinical history of constipation. Electronically Signed   By: Trudie Reedaniel  Entrikin M.D.   On: 11/21/2018 05:52    Procedures Procedures  Medications Ordered in ED Medications  magnesium citrate solution 1 Bottle (has no administration in time range)  potassium chloride SA (K-DUR) CR tablet 40 mEq (has no administration in time range)     Initial Impression / Assessment and Plan / ED Course  I have reviewed the triage vital signs and the nursing notes.  Pertinent labs & imaging results that were available during my care of the patient were reviewed by me and considered in my medical decision making (see chart for details).  Patient well-known to the emergency department with schizophrenia and frequent ED visits and recent hospitalization for CHF, left the hospital AGAINST MEDICAL ADVICE about 12 hours ago.  He has significant edema which is significantly improved from what he had prior to hospitalization.  At prior hospitalization, he had a brief run of nonsustained ventricular tachycardia.  Will check electrolytes and magnesium and ECG.  Will check KUB  to assess stool burden.  Labs show mild hypokalemia and is given oral potassium.  Also, stable anemia.  Magnesium levels normal.  X-rays show a moderately large stool burden.  I feel that if he has this cleared, he likely will have less problems with fecal leakage when urinating.  He is given a dose of magnesium citrate.  Also, given resource guide for shelters and mental health facilities.  Final Clinical Impressions(s) /  ED Diagnoses   Final diagnoses:  Chronic combined systolic and diastolic heart failure (HCC)  Hypokalemia  Homeless  Cocaine abuse (HCC)  Normocytic anemia    ED Discharge Orders         Ordered    Potassium Chloride ER 20 MEQ TBCR  Daily     11/21/18 1610           Dione Booze, MD 11/21/18 [phone]

## 2018-11-21 NOTE — Discharge Instructions (Signed)
The medication they gave you helped with your constipation.  It is very important for you to see the doctor and get back on your medications to help with your leg swelling.

## 2018-11-21 NOTE — ED Notes (Signed)
Pt provided basin, soap, washcloths and towels to clean himself up, Pt also provided scrubs and socks.Pt ambulatory and A&O x 4

## 2018-11-21 NOTE — ED Triage Notes (Addendum)
EMS reports Pt approached Ambulance and stated just released from hospital and given something to promote BM, and now "I have and you need to clean me up".  BP 144/78 HR 110 RR 20 Sp02 90 RA

## 2018-11-21 NOTE — ED Triage Notes (Signed)
Pt with c/o abd pain and swelling in extremities. Abd pain for "weeks"

## 2018-11-21 NOTE — ED Notes (Signed)
Bed: TJ03 Expected date:  Expected time:  Means of arrival:  Comments: EMS - bilateral leg pain, diabetes

## 2018-11-25 ENCOUNTER — Other Ambulatory Visit: Payer: Self-pay

## 2018-11-25 ENCOUNTER — Emergency Department (HOSPITAL_COMMUNITY)
Admission: EM | Admit: 2018-11-25 | Discharge: 2018-11-25 | Disposition: A | Discharge status: Home / Self Care | Payer: Medicaid Other | Attending: Emergency Medicine | Admitting: Emergency Medicine

## 2018-11-25 ENCOUNTER — Encounter (HOSPITAL_COMMUNITY): Payer: Self-pay | Admitting: Emergency Medicine

## 2018-11-25 ENCOUNTER — Encounter (HOSPITAL_COMMUNITY): Payer: Self-pay | Admitting: *Deleted

## 2018-11-25 ENCOUNTER — Emergency Department (HOSPITAL_COMMUNITY)
Admission: EM | Admit: 2018-11-25 | Discharge: 2018-11-25 | Disposition: A | Discharge status: Home / Self Care | Payer: Medicaid Other | Source: Home / Self Care | Attending: Emergency Medicine | Admitting: Emergency Medicine

## 2018-11-25 ENCOUNTER — Observation Stay (HOSPITAL_COMMUNITY)
Admission: EM | Admit: 2018-11-25 | Discharge: 2018-11-26 | Disposition: A | Discharge status: Home / Self Care | Payer: Medicaid Other | Attending: Emergency Medicine | Admitting: Emergency Medicine

## 2018-11-25 DIAGNOSIS — F1410 Cocaine abuse, uncomplicated: Secondary | ICD-10-CM | POA: Diagnosis not present

## 2018-11-25 DIAGNOSIS — I110 Hypertensive heart disease with heart failure: Secondary | ICD-10-CM | POA: Insufficient documentation

## 2018-11-25 DIAGNOSIS — F1910 Other psychoactive substance abuse, uncomplicated: Secondary | ICD-10-CM | POA: Diagnosis not present

## 2018-11-25 DIAGNOSIS — Z5900 Homelessness unspecified: Secondary | ICD-10-CM

## 2018-11-25 DIAGNOSIS — F4325 Adjustment disorder with mixed disturbance of emotions and conduct: Secondary | ICD-10-CM | POA: Diagnosis not present

## 2018-11-25 DIAGNOSIS — Z794 Long term (current) use of insulin: Secondary | ICD-10-CM | POA: Insufficient documentation

## 2018-11-25 DIAGNOSIS — F250 Schizoaffective disorder, bipolar type: Secondary | ICD-10-CM | POA: Insufficient documentation

## 2018-11-25 DIAGNOSIS — Z8249 Family history of ischemic heart disease and other diseases of the circulatory system: Secondary | ICD-10-CM | POA: Insufficient documentation

## 2018-11-25 DIAGNOSIS — F1210 Cannabis abuse, uncomplicated: Secondary | ICD-10-CM | POA: Insufficient documentation

## 2018-11-25 DIAGNOSIS — M25579 Pain in unspecified ankle and joints of unspecified foot: Secondary | ICD-10-CM | POA: Insufficient documentation

## 2018-11-25 DIAGNOSIS — I5042 Chronic combined systolic (congestive) and diastolic (congestive) heart failure: Secondary | ICD-10-CM | POA: Diagnosis not present

## 2018-11-25 DIAGNOSIS — N400 Enlarged prostate without lower urinary tract symptoms: Secondary | ICD-10-CM | POA: Insufficient documentation

## 2018-11-25 DIAGNOSIS — F17210 Nicotine dependence, cigarettes, uncomplicated: Secondary | ICD-10-CM | POA: Insufficient documentation

## 2018-11-25 DIAGNOSIS — Z79899 Other long term (current) drug therapy: Secondary | ICD-10-CM | POA: Insufficient documentation

## 2018-11-25 DIAGNOSIS — G4730 Sleep apnea, unspecified: Secondary | ICD-10-CM | POA: Insufficient documentation

## 2018-11-25 DIAGNOSIS — E785 Hyperlipidemia, unspecified: Secondary | ICD-10-CM | POA: Diagnosis not present

## 2018-11-25 DIAGNOSIS — Z7982 Long term (current) use of aspirin: Secondary | ICD-10-CM | POA: Insufficient documentation

## 2018-11-25 DIAGNOSIS — E1140 Type 2 diabetes mellitus with diabetic neuropathy, unspecified: Secondary | ICD-10-CM | POA: Insufficient documentation

## 2018-11-25 DIAGNOSIS — F17228 Nicotine dependence, chewing tobacco, with other nicotine-induced disorders: Secondary | ICD-10-CM | POA: Insufficient documentation

## 2018-11-25 DIAGNOSIS — R609 Edema, unspecified: Secondary | ICD-10-CM | POA: Insufficient documentation

## 2018-11-25 DIAGNOSIS — R0602 Shortness of breath: Principal | ICD-10-CM | POA: Insufficient documentation

## 2018-11-25 DIAGNOSIS — F200 Paranoid schizophrenia: Secondary | ICD-10-CM | POA: Insufficient documentation

## 2018-11-25 DIAGNOSIS — I509 Heart failure, unspecified: Secondary | ICD-10-CM

## 2018-11-25 DIAGNOSIS — F1420 Cocaine dependence, uncomplicated: Secondary | ICD-10-CM | POA: Insufficient documentation

## 2018-11-25 DIAGNOSIS — B1920 Unspecified viral hepatitis C without hepatic coma: Secondary | ICD-10-CM | POA: Insufficient documentation

## 2018-11-25 DIAGNOSIS — Z1159 Encounter for screening for other viral diseases: Secondary | ICD-10-CM | POA: Diagnosis not present

## 2018-11-25 DIAGNOSIS — I5043 Acute on chronic combined systolic (congestive) and diastolic (congestive) heart failure: Secondary | ICD-10-CM | POA: Insufficient documentation

## 2018-11-25 DIAGNOSIS — N4889 Other specified disorders of penis: Secondary | ICD-10-CM | POA: Insufficient documentation

## 2018-11-25 DIAGNOSIS — A601 Herpesviral infection of perianal skin and rectum: Secondary | ICD-10-CM | POA: Insufficient documentation

## 2018-11-25 DIAGNOSIS — J9811 Atelectasis: Secondary | ICD-10-CM | POA: Diagnosis not present

## 2018-11-25 DIAGNOSIS — G8929 Other chronic pain: Secondary | ICD-10-CM | POA: Diagnosis not present

## 2018-11-25 DIAGNOSIS — N5089 Other specified disorders of the male genital organs: Secondary | ICD-10-CM | POA: Diagnosis present

## 2018-11-25 DIAGNOSIS — F322 Major depressive disorder, single episode, severe without psychotic features: Secondary | ICD-10-CM | POA: Diagnosis present

## 2018-11-25 DIAGNOSIS — R601 Generalized edema: Secondary | ICD-10-CM

## 2018-11-25 LAB — GLUCOSE, CAPILLARY: Glucose-Capillary: 228 mg/dL — ABNORMAL HIGH (ref 70–99)

## 2018-11-25 MED ORDER — ASPIRIN EC 81 MG PO TBEC
81.0000 mg | DELAYED_RELEASE_TABLET | Freq: Every day | ORAL | Status: DC
  Administered 2018-11-25 – 2018-11-26 (×2): 81 mg via ORAL
  Filled 2018-11-25 (×2): qty 1

## 2018-11-25 MED ORDER — INSULIN ASPART 100 UNIT/ML ~~LOC~~ SOLN
0.0000 [IU] | Freq: Three times a day (TID) | SUBCUTANEOUS | Status: DC
  Administered 2018-11-26: 2 [IU] via SUBCUTANEOUS
  Administered 2018-11-26: 3 [IU] via SUBCUTANEOUS
  Filled 2018-11-25: qty 15
  Filled 2018-11-25: qty 0.02
  Filled 2018-11-25 (×2): qty 2

## 2018-11-25 MED ORDER — ACETAMINOPHEN 325 MG PO TABS
650.0000 mg | ORAL_TABLET | Freq: Four times a day (QID) | ORAL | Status: DC | PRN
  Administered 2018-11-25 – 2018-11-26 (×2): 650 mg via ORAL
  Filled 2018-11-25 (×2): qty 2

## 2018-11-25 MED ORDER — FUROSEMIDE 20 MG PO TABS
40.0000 mg | ORAL_TABLET | Freq: Once | ORAL | Status: AC
  Administered 2018-11-25: 40 mg via ORAL
  Filled 2018-11-25: qty 2

## 2018-11-25 MED ORDER — INSULIN GLARGINE 100 UNIT/ML ~~LOC~~ SOLN
14.0000 [IU] | Freq: Every day | SUBCUTANEOUS | Status: DC
  Administered 2018-11-25 – 2018-11-26 (×2): 14 [IU] via SUBCUTANEOUS

## 2018-11-25 MED ORDER — LISINOPRIL 10 MG PO TABS
10.0000 mg | ORAL_TABLET | Freq: Every day | ORAL | Status: DC
  Administered 2018-11-25 – 2018-11-26 (×2): 10 mg via ORAL
  Filled 2018-11-25 (×2): qty 1

## 2018-11-25 MED ORDER — CARVEDILOL 6.25 MG PO TABS
6.2500 mg | ORAL_TABLET | Freq: Two times a day (BID) | ORAL | Status: DC
  Administered 2018-11-26: 6.25 mg via ORAL
  Filled 2018-11-25: qty 1
  Filled 2018-11-25: qty 2

## 2018-11-25 MED ORDER — POTASSIUM CHLORIDE CRYS ER 10 MEQ PO TBCR
20.0000 meq | EXTENDED_RELEASE_TABLET | Freq: Every day | ORAL | Status: DC
  Administered 2018-11-25 – 2018-11-26 (×2): 20 meq via ORAL
  Filled 2018-11-25 (×5): qty 2

## 2018-11-25 MED ORDER — ACETAMINOPHEN 500 MG PO TABS
1000.0000 mg | ORAL_TABLET | Freq: Once | ORAL | Status: AC
  Administered 2018-11-25: 1000 mg via ORAL
  Filled 2018-11-25: qty 2

## 2018-11-25 NOTE — H&P (Signed)
Behavioral Health Medical Screening Exam  [name] is an 58 y.o. male patient presents to [facility] via law enforcement under IVC by [facility]; with complaints that patient reported that he wanted to kill himself.  Patient states that he has a lot of medical problems and retaining fluid and unable to walk. States that he has no where to stay and can't go back out because "There's nothing but drugs for me in them streets.  I need somewhere to stay until I get a place the 3rd of the month.  Patient states that his sister Drue Flirt is his payee.  Patient states I ain't been no where but in the [place] jail, the hospital and [facility] ED.  I got fluid problems; that why my legs and stomach swollen.  I take a pill called lasix to get the fluid off.  I got prostrate problem and groin.  [facility] done did all they can do for me.  I just need a place to stay until I can get somewhere.  The hospital filled out a FL2 for me to take to assisted Living.  If I stay here the social worker can help me.  I get paid on the 3rd; I get 908 dollars and then I'll have some money.  Ain't nothing on the street for me bu drugs.  I told them I was suicidal; cause I need some place to stay besides the streets. Patient discharged from hospital to day twice once related to medical issues and second requesting to assistance from Child psychotherapist.   Patient request for hospital stay primarily for secondary gain related to not wanting to be out in street with drugs and to see a Child psychotherapist to help get into assisted living.        Total Time spent with patient: 45 minutes  Psychiatric Specialty Exam: Physical Exam  Vitals reviewed. Neck: Normal range of motion. Neck supple.  Respiratory: Effort normal.  Psychiatric: Cognition and memory are normal. He expresses impulsivity.  Patient does not appear to be respondind to internal/external stimuli, or responding to delusional thoughts.      ROS  Blood pressure (!) 150/105,  pulse 100, temperature 98.6 F (37 C), temperature source Oral, resp. rate 20, SpO2 97 %.There is no height or weight on file to calculate BMI.  General Appearance: Disheveled  Eye Contact:  Good  Speech:  Clear and Coherent and Normal Rate  Volume:  Increased  Mood:  Anxious and Irritable  Affect:  Labile  Thought Process:  Coherent  Orientation:  Full (Time, Place, and Person)  Thought Content:  Reports he is hearing voices; but does not appear to be responding to internal/external stimule  Suicidal Thoughts:  Chronic suicidal ideation.  States he needs a place to stay to stay away from drug.  States that he will walk into traffic  Homicidal Thoughts:  No  Memory:  Immediate;   Good Recent;   Good  Judgement:  Intact  Insight:  Shallow  Psychomotor Activity:  Normal  Concentration: Concentration: Good  Recall:  Good  Fund of Knowledge:Fair  Language: Good  Akathisia:  No  Handed:  Right  AIMS (if indicated):     Assets:  Communication Skills Social Support  Sleep:       Musculoskeletal: Strength & Muscle Tone: within normal limits Gait & Station: normal Patient leans: N/A  Blood pressure (!) 150/105, pulse 100, temperature 98.6 F (37 C), temperature source Oral, resp. rate 20, SpO2 97 %.  Recommendations:  Observation overnight.  Possible discharge in the morning to follow up with outpatient psychiatric service  Based on my evaluation the patient does not appear to have an emergency medical condition.  Alam Guterrez, NP 11/25/2018, 3:42 PM

## 2018-11-25 NOTE — ED Triage Notes (Signed)
Pt returns to ED this am after eval and d/c last night. Now c/o sob r/t CHF. Also states he needs SW for assistance finding group home

## 2018-11-25 NOTE — Progress Notes (Signed)
[place] NOVEL CORONAVIRUS (COVID-19) DAILY CHECK-OFF SYMPTOMS - answer yes or no to each - every day NO YES  Have you had a fever in the past 24 hours?  . Fever (Temp > 37.80C / 100F) X   Have you had any of these symptoms in the past 24 hours? . New Cough .  Sore Throat  .  Shortness of Breath .  Difficulty Breathing .  Unexplained Body Aches   X   Have you had any one of these symptoms in the past 24 hours not related to allergies?   . Runny Nose .  Nasal Congestion .  Sneezing   X   If you have had runny nose, nasal congestion, sneezing in the past 24 hours, has it worsened?  X   EXPOSURES - check yes or no X   Have you traveled outside the state in the past 14 days?  X   Have you been in contact with someone with a confirmed diagnosis of COVID-19 or PUI in the past 14 days without wearing appropriate PPE?  X   Have you been living in the same home as a person with confirmed diagnosis of COVID-19 or a PUI (household contact)?    X   Have you been diagnosed with COVID-19?    X              What to do next: Answered NO to all: Answered YES to anything:   Proceed with unit schedule Follow the [facility] Inpatient Flowsheet.   

## 2018-11-25 NOTE — Plan of Care (Signed)
[facility] Observation Crisis Plan  Reason for Crisis Plan:  Chronic Mental Illness/Medical Illness, Medication Management and Substance Abuse   Plan of Care:  Referral for IOP and Referral for Substance Abuse  Family Support:    Per pt "nobody"  Current Living Environment:  Living Arrangements: Alone  Insurance:   Hospital Account    Name Acct ID Class Status Primary Coverage   [place], [place] [id number] BEHAVIORAL HEALTH OBSERVATION Open [place]        Guarantor Account (for Hospital Account [id number])    Name Relation to Pt Service Area Active? Acct Type   [facility]   Address Phone       [address] [place], [place] [address] [phone](H)          Coverage Information (for Hospital Account [id number])    F/O Payor/Plan Precert #   [facility] [place]ICAID/[place]    Subscriber Subscriber #   [place], [place]une [id number]   Address Phone   [address] [place], [place] [address] [phone]      Legal Guardian:  Legal Guardian: Other:(Self)  Primary Care Provider:  Patient, No Pcp Per  Current Outpatient Providers:  [name]  Psychiatrist:  Name of Psychiatrist: [name]  Counselor/Therapist:  Name of Therapist: [name]  Compliant with Medications:  Yes  Additional Information:   Sherryl Manges 5/27/20208:03 PM

## 2018-11-25 NOTE — Progress Notes (Signed)
[place] NOVEL CORONAVIRUS (COVID-19) DAILY CHECK-OFF SYMPTOMS - answer yes or no to each - every day NO YES  Have you had a fever in the past 24 hours?  . Fever (Temp > 37.80C / 100F) X   Have you had any of these symptoms in the past 24 hours? . New Cough .  Sore Throat  .  Shortness of Breath .  Difficulty Breathing .  Unexplained Body Aches   X   Have you had any one of these symptoms in the past 24 hours not related to allergies?   . Runny Nose .  Nasal Congestion .  Sneezing   X   If you have had runny nose, nasal congestion, sneezing in the past 24 hours, has it worsened?  X   EXPOSURES - check yes or no X   Have you traveled outside the state in the past 14 days?  X   Have you been in contact with someone with a confirmed diagnosis of COVID-19 or PUI in the past 14 days without wearing appropriate PPE?  X   Have you been living in the same home as a person with confirmed diagnosis of COVID-19 or a PUI (household contact)?    X   Have you been diagnosed with COVID-19?    X              What to do next: Answered NO to all: Answered YES to anything:   Proceed with unit schedule Follow the [facility] Inpatient Flowsheet.   

## 2018-11-25 NOTE — ED Notes (Signed)
Pt given two packs of graham crackers and a milk.

## 2018-11-25 NOTE — ED Triage Notes (Signed)
  Patient BIB EMS for penile pain and abdominal pain that has been going on for the last two hours.  Patient states he was released from prison this afternoon and has snorted 1 gram  of cocaine. Patient states his prostate is the size of a cantaloupe.  Patient states his pain is 10/10.  Patient is A&O.

## 2018-11-25 NOTE — ED Notes (Signed)
Pt requesting to use phone to call group homes, also c/o continued groin swelling and shortness of breath, pt seen for same and discharged about one hour prior, never left property and decided to check back in. No changes in condition since discharge, no distress noted.

## 2018-11-25 NOTE — Progress Notes (Addendum)
[name] observed eating in bed. Pt is to limit fluid intake per report. Rates pain 8/10;scrotum. Provider on call notified for PRN. Pt appears needy and demanding; frequently splitting staff for snacks and fluids. Pt was given ice chips. Pt was given extra pillows to help with SOB due to fluids excess. Support offered and will continue with POC.

## 2018-11-25 NOTE — Progress Notes (Signed)
Admission DAR Note Pt presented to [facility] Observation unit accompanied by [employer] under IVC status related to SI with plan to walk in front of a bus. Observed to be agitated with labile mood "I'm Schizophrenia, damn it I need to be seen and my COPD is bothering me, I need my fluid pill now". Reports history of daily cocaine, etoh and tobacco use. States he last used 1 gm of cocaine today, last etoh use was "sometime this week" and I do smoke 1 pkt of cigarettes everyday". Denies SI, HI, AVh and pain "not right now but you see my stomach and my legs, I need all this fluid out of me, I need help". Skin assessment done, edema noted in upper and lower extremities, provider made aware. Pulses are palpable but faint in lower extremities. Pt's gait is unsteady. Incontinent of stool X1. Showered and changed scrubs. Unit orientation done and routines discussed. Understanding verbalized. Q 15 minutes checks initiated for safety.

## 2018-11-25 NOTE — Discharge Instructions (Signed)
Use resources below to find placement to live.

## 2018-11-25 NOTE — ED Provider Notes (Signed)
[facility] EMERGENCY DEPARTMENT Provider Note  CSN: [id number] Arrival date & time: 11/25/18 0447  Chief Complaint(s) Groin Swelling and Penis Pain  HPI [name] is a 58 y.o. male with extensive past medical history listed below including homelessness, polysubstance abuse, anasarca on Lasix who presents to the emergency department with scrotal swelling and penile pain.  He reports that he just got out of jail and would like his FL to that was given to him during his last admission because he lost it on the streets.  He says he is trying to get placement and needs this to do so.  He reports that his anasarca has improved since taking his medicine in jail.  States that he the jail provided him with prescriptions for all his medications.  Penile pain is chronic and related to anasarca.  There is no acute changes.  He is requesting Tylenol for the pain.  Denies any dysuria.  No abdominal pain.  Denies any other physical complaints.  HPI  Past Medical History Past Medical History:  Diagnosis Date  . Chronic foot pain   . Cocaine abuse (HCC)   . Diabetes mellitus without complication (HCC)   . Hepatitis C    unsure   . Homelessness   . Hypertension   . Neuropathy   . Polysubstance abuse (HCC)   . Schizophrenia (HCC)   . Sleep apnea   . Systolic and diastolic CHF, chronic [facility])    Patient Active Problem List   Diagnosis Date Noted  . Pressure injury of skin 11/08/2018  . Elevated troponin 10/18/2018  . HLD (hyperlipidemia) 10/18/2018  . Anxiety 10/18/2018  . CHF (congestive heart failure), NYHA class II, acute on chronic, combined (HCC) 10/18/2018  . Hypertensive urgency 10/12/2018  . Mild renal insufficiency 10/12/2018  . Cellulitis 10/12/2018  . Penile cellulitis   . Adjustment disorder with mixed disturbance of emotions and conduct   . Sleep apnea 10/03/2018  . Hypokalemia 09/17/2018  . Scrotal swelling 09/17/2018  . Urinary hesitancy   .  Constipation   . Acute on chronic combined systolic and diastolic CHF (congestive heart failure) (HCC) 08/25/2018  . Acute respiratory failure with hypoxia (HCC) 08/25/2018  . Tobacco use 08/18/2018  . Homelessness 08/08/2018  . Smoker 08/08/2018  . Prostate enlargement 03/16/2018  . Aortic atherosclerosis (HCC) 03/16/2018  . 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 Chronic foot pain   . Schizoaffective disorder, bipolar type (HCC) 09/30/2016  . Substance induced mood disorder (HCC) 03/13/2015  . Acute kidney failure (HCC) 01/26/2015  . Schizophrenia, paranoid type (HCC) 01/17/2015  . Drug hallucinosis (HCC) 10/08/2014  . Chronic paranoid schizophrenia (HCC) 09/07/2014  . 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 Urinary retention   . Cocaine use disorder, severe, dependence (HCC)   . Essential hypertension, benign 03/28/2013  . Insulin-requiring or dependent type II diabetes mellitus (HCC) 03/15/2013   Home Medication(s) Prior to Admission medications   Medication Sig Start Date End Date Taking? Authorizing Provider  acetaminophen (TYLENOL) 500 MG tablet Take 1,000 mg by mouth every 6 (six) hours as needed for moderate pain.    [provider]  ARIPiprazole (ABILIFY) 10 MG tablet Take 1 tablet (10 mg total) by mouth daily. Patient not taking: Reported on 11/13/2018 09/18/18   Meredeth Ide, MD  aspirin EC 81 MG tablet Take 1 tablet (81 mg total) by mouth daily. Patient not taking: Reported on 11/13/2018 10/19/18  02/16/19  Meredeth IdeLama, Gagan S, MD  atorvastatin (LIPITOR) 40 MG tablet Take 1 tablet (40 mg total) by mouth daily. Patient not taking: Reported on 11/13/2018 09/18/18   Meredeth IdeLama, Gagan S, MD  carbamazepine (TEGRETOL) 200 MG tablet Take 0.5 tablets (100 mg total) by mouth 2 (two) times daily. Patient not taking: Reported on 11/13/2018 09/18/18   Meredeth IdeLama, Gagan S, MD  carvedilol (COREG) 6.25 MG tablet Take 1  tablet (6.25 mg total) by mouth 2 (two) times daily with a meal. Patient not taking: Reported on 11/13/2018 10/19/18   Meredeth IdeLama, Gagan S, MD  furosemide (LASIX) 20 MG tablet Take 3 tablets (60 mg total) by mouth daily for 30 days. Patient not taking: Reported on 11/21/2018 11/11/18 12/11/18  Michela PitcherFawze, Mina A, PA-C  hydrocortisone (PROCTO-PAK) 1 % CREA Apply twice a day to hemorrhoids. Patient not taking: Reported on 11/13/2018 09/29/18   Renne CriglerGeiple, Joshua, PA-C  Insulin Glargine (LANTUS) 100 UNIT/ML Solostar Pen Inject 14 Units into the skin daily. Patient not taking: Reported on 11/13/2018 09/18/18   Meredeth IdeLama, Gagan S, MD  lisinopril (PRINIVIL,ZESTRIL) 10 MG tablet Take 1 tablet (10 mg total) by mouth daily. Patient not taking: Reported on 11/13/2018 09/18/18   Meredeth IdeLama, Gagan S, MD  polyethylene glycol [facility](MIRALAX / GLYCOLAX) packet Take 17 g by mouth 2 (two) times daily. Patient not taking: Reported on 11/13/2018 09/18/18   Meredeth IdeLama, Gagan S, MD  Potassium Chloride ER 20 MEQ TBCR Take 20 mEq by mouth daily. Take one pill twice a day for five days, then take one pill every day 11/21/18   Dione BoozeGlick, David, MD  tamsulosin [facility](FLOMAX) 0.4 MG CAPS capsule Take 1 capsule (0.4 mg total) by mouth daily after breakfast. Patient not taking: Reported on 11/13/2018 10/05/18   Narda BondsNettey, Ralph A, MD                                                                                                                                    Past Surgical History Past Surgical History:  Procedure Laterality Date  . MULTIPLE TOOTH EXTRACTIONS     Family History Family History  Problem Relation Age of Onset  . Hypertension Other   . Diabetes Other     Social History Social History   Tobacco Use  . Smoking status: Current Every Day Smoker    Packs/day: 1.00    Years: 20.00    Pack years: 20.00    Types: Cigarettes  . Smokeless tobacco: Current User  Substance Use Topics  . Alcohol use: Yes    Comment: Daily Drinker   . Drug use: Yes    Frequency: 7.0  times per week    Types: "Crack" cocaine, Cocaine, Marijuana    Comment: used yesterday    Allergies Haldol [haloperidol]  Review of Systems Review of Systems All other systems are reviewed and are negative for acute change except as noted in the HPI  Physical Exam Vital Signs  I have reviewed the triage vital signs BP (!) 153/98   Pulse (!) 102   Temp 98.5 F (36.9 C) (Oral)   Resp 19   Ht  (1.753 m)   Wt 112 kg   SpO2 98%   BMI 36.46 kg/m   Physical Exam Vitals signs reviewed.  Constitutional:      General: He is not in acute distress.    Appearance: He is well-developed. He is not diaphoretic.  HENT:     Head: Normocephalic and atraumatic.     Nose: Nose normal.  Eyes:     General: No scleral icterus.       Right eye: No discharge.        Left eye: No discharge.     Conjunctiva/sclera: Conjunctivae normal.     Pupils: Pupils are equal, round, and reactive to light.  Neck:     Musculoskeletal: Normal range of motion and neck supple.  Cardiovascular:     Rate and Rhythm: Normal rate and regular rhythm.     Heart sounds: No murmur. No friction rub. No gallop.      Comments: Edema up to the scrotum and lower abdomen Pulmonary:     Effort: Pulmonary effort is normal. No respiratory distress.     Breath sounds: Normal breath sounds. No stridor. No rales.  Abdominal:     General: There is no distension.     Palpations: Abdomen is soft.     Tenderness: There is no abdominal tenderness.  Genitourinary:    Penis: Swelling present. No erythema, tenderness or discharge.   Musculoskeletal:        General: No tenderness.     Right lower leg: 2+ Pitting Edema present.     Left lower leg: 2+ Pitting Edema present.  Skin:    General: Skin is warm and dry.     Findings: No erythema or rash.  Neurological:     Mental Status: He is alert and oriented to person, place, and time.     ED Results and Treatments Labs (all labs ordered are listed, but only abnormal  results are displayed) Labs Reviewed - No data to display                                                                                                                       EKG  EKG Interpretation  Date/Time:    Ventricular Rate:    PR Interval:    QRS Duration:   QT Interval:    QTC Calculation:   R Axis:     Text Interpretation:        Radiology No results found. Pertinent labs & imaging results that were available during my care of the patient were reviewed by me and considered in my medical decision making (see chart for details).  Medications Ordered in ED Medications  acetaminophen (TYLENOL) tablet 1,000 mg (1,000 mg Oral Given 11/25/18 0606)  furosemide (LASIX) tablet 40 mg (40 mg Oral Given 11/25/18 0606)  Procedures Procedures  (including critical care time)  Medical Decision Making / ED Course I have reviewed the nursing notes for this encounter and the patient's prior records (if available in EHR or on provided paperwork).    Chronic anasarca.  There is no evidence of superimposed infection to his growing.  No evidence to suggest Fournier's.  Patient provided with oral Tylenol and Lasix.  His FiO2 was reprinted and provided.  The patient appears reasonably screened and/or stabilized for discharge and I doubt any other medical condition or other [facility] requiring further screening, evaluation, or treatment in the ED at this time prior to discharge.  The patient is safe for discharge with strict return precautions.   Final Clinical Impression(s) / ED Diagnoses Final diagnoses:  Anasarca    Disposition: Discharge  Condition: Good  I have discussed the results, Dx and Tx plan with the patient who expressed understanding and agree(s) with the plan. Discharge instructions discussed at great length. The patient was given strict return  precautions who verbalized understanding of the instructions. No further questions at time of discharge.    ED Discharge Orders    None       Follow Up: Primary care provider  Schedule an appointment as soon as possible for a visit  As needed     This chart was dictated using voice recognition software.  Despite best efforts to proofread,  errors can occur which can change the documentation meaning.   Nira Conn, MD 11/25/18 [phone]

## 2018-11-25 NOTE — ED Provider Notes (Signed)
[facility] EMERGENCY DEPARTMENT Provider Note   CSN: [id number] Arrival date & time: 11/25/18  9449    History   Chief Complaint Chief Complaint  Patient presents with  . Follow-up    HPI [name] is a 58 y.o. male.     The history is provided by the patient and medical records. No language interpreter was used.     58 year old male well-known to the ED with history of homelessness, polysubstance abuse, psychiatric disorder frequent ER visit presenting request social work assistance in finding group home.  Patient was seen earlier this morning for similar complaint.  Per note from Dr. Eudelia Bunch who has seen patient last night.  Patient was here for symptom consistent with his chronic anasarca.  No evidence of infection were noted and no complicating feature.  Patient did receive Lasix and Tylenol as well as food.  Past Medical History:  Diagnosis Date  . Chronic foot pain   . Cocaine abuse (HCC)   . Diabetes mellitus without complication (HCC)   . Hepatitis C    unsure   . Homelessness   . Hypertension   . Neuropathy   . Polysubstance abuse (HCC)   . Schizophrenia (HCC)   . Sleep apnea   . Systolic and diastolic CHF, chronic [facility])     Patient Active Problem List   Diagnosis Date Noted  . Pressure injury of skin 11/08/2018  . Elevated troponin 10/18/2018  . HLD (hyperlipidemia) 10/18/2018  . Anxiety 10/18/2018  . CHF (congestive heart failure), NYHA class II, acute on chronic, combined (HCC) 10/18/2018  . Hypertensive urgency 10/12/2018  . Mild renal insufficiency 10/12/2018  . Cellulitis 10/12/2018  . Penile cellulitis   . Adjustment disorder with mixed disturbance of emotions and conduct   . Sleep apnea 10/03/2018  . Hypokalemia 09/17/2018  . Scrotal swelling 09/17/2018  . Urinary hesitancy   . Constipation   . Acute on chronic combined systolic and diastolic CHF (congestive heart failure) (HCC) 08/25/2018  . Acute respiratory  failure with hypoxia (HCC) 08/25/2018  . Tobacco use 08/18/2018  . Homelessness 08/08/2018  . Smoker 08/08/2018  . Prostate enlargement 03/16/2018  . Aortic atherosclerosis (HCC) 03/16/2018  . 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 Chronic foot pain   . Schizoaffective disorder, bipolar type (HCC) 09/30/2016  . Substance induced mood disorder (HCC) 03/13/2015  . Acute kidney failure (HCC) 01/26/2015  . Schizophrenia, paranoid type (HCC) 01/17/2015  . Drug hallucinosis (HCC) 10/08/2014  . Chronic paranoid schizophrenia (HCC) 09/07/2014  . 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 Urinary retention   . Cocaine use disorder, severe, dependence (HCC)   . Essential hypertension, benign 03/28/2013  . 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 MULTIPLE TOOTH EXTRACTIONS          Home Medications    Prior to Admission medications   Medication Sig Start Date End Date Taking? Authorizing Provider  acetaminophen (TYLENOL) 500 MG tablet Take 1,000 mg by mouth every 6 (six) hours as needed for moderate pain.    [provider]  ARIPiprazole (ABILIFY) 10 MG tablet Take 1 tablet (10 mg total) by mouth daily. Patient not taking: Reported on 11/13/2018 09/18/18   Meredeth Ide, MD  aspirin EC 81 MG tablet Take 1 tablet (81 mg total) by mouth daily. Patient not taking: Reported on 11/13/2018 10/19/18  02/16/19  Meredeth Ide, MD  atorvastatin (LIPITOR) 40 MG tablet Take 1 tablet (40 mg total) by mouth daily. Patient not taking: Reported on 11/13/2018 09/18/18   Meredeth Ide, MD  carbamazepine (TEGRETOL) 200 MG tablet Take 0.5 tablets (100 mg total) by mouth 2 (two) times daily. Patient not taking: Reported on 11/13/2018 09/18/18   Meredeth Ide, MD  carvedilol (COREG) 6.25 MG tablet Take 1 tablet (6.25 mg total) by mouth 2 (two)  times daily with a meal. Patient not taking: Reported on 11/13/2018 10/19/18   Meredeth Ide, MD  furosemide (LASIX) 20 MG tablet Take 3 tablets (60 mg total) by mouth daily for 30 days. Patient not taking: Reported on 11/21/2018 11/11/18 12/11/18  Michela Pitcher A, PA-C  hydrocortisone (PROCTO-PAK) 1 % CREA Apply twice a day to hemorrhoids. Patient not taking: Reported on 11/13/2018 09/29/18   Renne Crigler, PA-C  Insulin Glargine (LANTUS) 100 UNIT/ML Solostar Pen Inject 14 Units into the skin daily. Patient not taking: Reported on 11/13/2018 09/18/18   Meredeth Ide, MD  lisinopril (PRINIVIL,ZESTRIL) 10 MG tablet Take 1 tablet (10 mg total) by mouth daily. Patient not taking: Reported on 11/13/2018 09/18/18   Meredeth Ide, MD  polyethylene glycol [facility] / GLYCOLAX) packet Take 17 g by mouth 2 (two) times daily. Patient not taking: Reported on 11/13/2018 09/18/18   Meredeth Ide, MD  Potassium Chloride ER 20 MEQ TBCR Take 20 mEq by mouth daily. Take one pill twice a day for five days, then take one pill every day 11/21/18   Dione Booze, MD  tamsulosin [facility]) 0.4 MG CAPS capsule Take 1 capsule (0.4 mg total) by mouth daily after breakfast. Patient not taking: Reported on 11/13/2018 10/05/18   Narda Bonds, MD    Family History Family History  Problem Relation Age of Onset  . Hypertension Other   . Diabetes Other     Social History Social History   Tobacco Use  . Smoking status: Current Every Day Smoker    Packs/day: 1.00    Years: 20.00    Pack years: 20.00    Types: Cigarettes  . Smokeless tobacco: Current User  Substance Use Topics  . Alcohol use: Yes    Comment: Daily Drinker   . Drug use: Yes    Frequency: 7.0 times per week    Types: "Crack" cocaine, Cocaine, Marijuana    Comment: used yesterday      Allergies   Haldol [haloperidol]   Review of Systems Review of Systems  All other systems reviewed and are negative.    Physical Exam Updated Vital Signs BP 139/84 (BP  Location: Right Arm)   Pulse 96   Temp 98.5 F (36.9 C) (Oral)   Resp 18   SpO2 93%   Physical Exam Vitals signs and nursing note reviewed.  Constitutional:      General: He is not in acute distress.    Appearance: He is well-developed.  HENT:     Head: Atraumatic.  Eyes:     Conjunctiva/sclera: Conjunctivae normal.  Neck:     Musculoskeletal: Neck supple.  Musculoskeletal:        General: Swelling (Widespread anasarca noted to extremities and scrotum) present.  Skin:    Findings: No rash.  Neurological:     Mental Status: He is alert.      ED Treatments / Results  Labs (all labs ordered are listed, but only abnormal results are displayed) Labs Reviewed - No data  to display  EKG None  Radiology No results found.  Procedures Procedures (including critical care time)  Medications Ordered in ED Medications - No data to display   Initial Impression / Assessment and Plan / ED Course  I have reviewed the triage vital signs and the nursing notes.  Pertinent labs & imaging results that were available during my care of the patient were reviewed by me and considered in my medical decision making (see chart for details).        BP 139/84 (BP Location: Right Arm)   Pulse 96   Temp 98.5 F (36.9 C) (Oral)   Resp 18   SpO2 93%    Final Clinical Impressions(s) / ED Diagnoses   Final diagnoses:  Peripheral edema    ED Discharge Orders    None     9:21 AM Patient is homeless with history of psychiatric illness as well as having peripheral edema.  He has been seen in the ED on an almost daily basis with similar complaints of peripheral edema.  He has been giving appropriate treatment as well as referral but has been noncompliant.  He was seen yesterday for the same, was screened, treated, discharge. He now requesting for group home referral  He is currently well-appearing, vital signs stable.  He is stable for discharge.   Fayrene Helperran, Hadden Steig, PA-C 11/25/18 [id number]     Geoffery Lyonselo, Douglas, MD 11/25/18 [phone]

## 2018-11-25 NOTE — BH Assessment (Addendum)
Assessment Note  [name] is an 58 y.o. male.  The pt came in under IVC due to the pt being suicidal with a plan to walk in front of a bus.  The pt denies any major stressors.  He said he also wants to detox from cocaine.  He last used cocaine today .  He also has a history of using heroin and last used a month ago. The pt denies any major stresor currently.  He was IVC'd by [facility] today. He went to [facility] today for medical reasons.    The pt is currently homeless.  The pt denies self harm, HI, and a history of abuse.  The pt has a trespassing charge that he goes to court for next month.  He reported he sees snakes and hears people talking about him.  He stated he isn't sleeping well and has a good appetite.  Pt is dressed in scrubs. He is alert and oriented x4. Pt speaks in a clear tone, at moderate volume and normal pace. Eye contact is good. Pt's mood is depressed. Thought process is coherent and relevant. There is no indication Pt is currently responding to internal stimuli or experiencing delusional thought content.?Pt was cooperative throughout assessment.    Diagnosis: F25.1 Schizoaffective disorder, Depressive type  Past Medical History:  Past Medical History:  Diagnosis Date  . Chronic foot pain   . Cocaine abuse (HCC)   . Diabetes mellitus without complication (HCC)   . Hepatitis C    unsure   . Homelessness   . Hypertension   . Neuropathy   . Polysubstance abuse (HCC)   . Schizophrenia (HCC)   . Sleep apnea   . Systolic and diastolic CHF, chronic (HCC)     Past Surgical History:  Procedure Laterality Date  . MULTIPLE TOOTH EXTRACTIONS      Family History:  Family History  Problem Relation Age of Onset  . Hypertension Other   . Diabetes Other     Social History:  reports that he has been smoking cigarettes. He has a 20.00 pack-year smoking history. He uses smokeless tobacco. He reports current alcohol use. He reports current drug use.  Frequency: 7.00 times per week. Drugs: "Crack" cocaine, Cocaine, and Marijuana.  Additional Social History:  Alcohol / Drug Use Pain Medications: See MAR Prescriptions: See MAR Over the Counter: See MAR History of alcohol / drug use?: Yes Longest period of sobriety (when/how long): NA Substance #1 Name of Substance 1: heroin 1 - Age of First Use: NA 1 - Amount (size/oz): unknown 1 - Frequency: unkown 1 - Last Use / Amount: "last week" Substance #2 Name of Substance 2: cocaine 2 - Age of First Use: unknown 2 - Amount (size/oz): unknown 2 - Frequency: unknown 2 - Last Use / Amount: 11/25/2018  CIWA: CIWA-Ar BP: (!) 150/105 Pulse Rate: 100 COWS:    Allergies:  Allergies  Allergen Reactions  . Haldol [Haloperidol] Other (See Comments)    Muscle spasms, loss of voluntary movement. However, pt has taken Thorazine on multiple occasions with no adverse effects.     Home Medications: (Not in a hospital admission)   OB/GYN Status:  No LMP for male patient.  General Assessment Data [facility] Assessment[facility] TTS Assessment: In system Is this a Tele or Face-to-Face Assessment?: Face-to-Face Is this an Initial Assessment or a Re-assessment for this encounter?: Initial Assessment Patient Accompanied by:: N/A Language Other than English: No Living Arrangements: Homeless/Shelter What  gender do you identify as?: Male Marital status: Single Living Arrangements: Alone Can pt return to current living arrangement?: Yes Admission Status: Involuntary Petitioner: Other([name]) Is patient capable of signing voluntary admission?: (under IVC) Referral Source: Psychiatrist Insurance type: medicaid  Medical Screening Exam [facility] Walk-in ONLY) Medical Exam completed: Yes  Crisis Care Plan Living Arrangements: Alone Legal Guardian: Other:(Self) Name of Psychiatrist: [name] Name of Therapist: [name]  Education Status Is patient currently in school?: No Is the  patient employed, unemployed or receiving disability?: Receiving disability income  Risk to self with the past 6 months Suicidal Ideation: Yes-Currently Present Has patient been a risk to self within the past 6 months prior to admission? : No Suicidal Intent: Yes-Currently Present Has patient had any suicidal intent within the past 6 months prior to admission? : Yes Is patient at risk for suicide?: Yes Suicidal Plan?: Yes-Currently Present Has patient had any suicidal plan within the past 6 months prior to admission? : Yes Specify Current Suicidal Plan: to walk in front of a bus Access to Means: Yes Specify Access to Suicidal Means: can get to a bus What has been your use of drugs/alcohol within the last 12 months?: cocaine use Previous Attempts/Gestures: Yes How many times?: 2 Other Self Harm Risks: pt denies Triggers for Past Attempts: Other (Comment)(pt denies) Intentional Self Injurious Behavior: None Family Suicide History: No Recent stressful life event(s): Other (Comment)(pt denies) Persecutory voices/beliefs?: No Depression: No Depression Symptoms: Despondent, Insomnia Substance abuse history and/or treatment for substance abuse?: Yes Suicide prevention information given to non-admitted patients: Not applicable  Risk to Others within the past 6 months Homicidal Ideation: No Does patient have any lifetime risk of violence toward others beyond the six months prior to admission? : No Thoughts of Harm to Others: No Current Homicidal Intent: No Current Homicidal Plan: No Access to Homicidal Means: No Identified Victim: pt denies History of harm to others?: No Assessment of Violence: None Noted Violent Behavior Description: pt denies Does patient have access to weapons?: No Criminal Charges Pending?: No Does patient have a court date: No Is patient on probation?: No  Psychosis Hallucinations: Auditory, Visual Delusions: None noted  Mental Status  Report Appearance/Hygiene: Unremarkable Eye Contact: Good Motor Activity: Freedom of movement, Unremarkable Speech: Logical/coherent Level of Consciousness: Alert Mood: Depressed Affect: Depressed Anxiety Level: None Thought Processes: Coherent, Relevant Judgement: Impaired Obsessive Compulsive Thoughts/Behaviors: None  Cognitive Functioning Concentration: Normal Memory: Recent Intact, Remote Intact Is patient IDD: No Insight: Poor Impulse Control: Poor Appetite: Poor Have you had any weight changes? : No Change Sleep: Decreased Total Hours of Sleep: 4 Vegetative Symptoms: None  ADLScreening [facility] Assessment Services) Patient's cognitive ability adequate to safely complete daily activities?: Yes Patient able to express need for assistance with ADLs?: Yes Independently performs ADLs?: Yes (appropriate for developmental age)  Prior Inpatient Therapy Prior Inpatient Therapy: Yes Prior Therapy Dates: unknown Prior Therapy Facilty/Provider(s): unknown Reason for Treatment: depression  Prior Outpatient Therapy Prior Outpatient Therapy: Yes Prior Therapy Dates: current Prior Therapy Facilty/Provider(s): [name] Reason for Treatment: schizoaffective disorder Does patient have an ACCT team?: No Does patient have Intensive In-House Services?  : No Does patient have [name] services? : Yes Does patient have P4CC services?: No  ADL Screening (condition at time of admission) Patient's cognitive ability adequate to safely complete daily activities?: Yes Patient able to express need for assistance with ADLs?: Yes Independently performs ADLs?: Yes (appropriate for developmental age)       Abuse/Neglect Assessment (Assessment to be complete  while patient is alone) Abuse/Neglect Assessment Can Be Completed: Yes Physical Abuse: Denies Verbal Abuse: Denies Sexual Abuse: Denies Exploitation of patient/patient's resources: Denies Self-Neglect: Denies Values / Beliefs Cultural  Requests During Hospitalization: None Spiritual Requests During Hospitalization: None Consults Spiritual Care Consult Needed: No Social Work Consult Needed: No            Disposition:  Disposition Initial Assessment Completed for this Encounter: Yes Disposition of Patient: (observe) Patient refused recommended treatment: No   MD Cobos recommends the pt be observed and reassessed in the AM.  On Site Evaluation by:   Reviewed with Physician:    Ottis Stain 11/25/2018 4:16 PM

## 2018-11-26 ENCOUNTER — Observation Stay (HOSPITAL_COMMUNITY): Payer: Medicaid Other

## 2018-11-26 ENCOUNTER — Encounter (HOSPITAL_COMMUNITY): Payer: Self-pay

## 2018-11-26 LAB — SARS CORONAVIRUS 2 BY RT PCR (HOSPITAL ORDER, PERFORMED IN ~~LOC~~ HOSPITAL LAB): SARS Coronavirus 2: NEGATIVE

## 2018-11-26 LAB — BASIC METABOLIC PANEL
Anion gap: 5 (ref 5–15)
BUN: 21 mg/dL — ABNORMAL HIGH (ref 6–20)
CO2: 30 mmol/L (ref 22–32)
Calcium: 8.5 mg/dL — ABNORMAL LOW (ref 8.9–10.3)
Chloride: 106 mmol/L (ref 98–111)
Creatinine, Ser: 1.11 mg/dL (ref 0.61–1.24)
GFR calc Af Amer: >60 mL/min (ref >60)
GFR calc non Af Amer: >60 mL/min (ref >60)
Glucose, Bld: 132 mg/dL — ABNORMAL HIGH (ref 70–99)
Potassium: 3.7 mmol/L (ref 3.5–5.1)
Sodium: 141 mmol/L (ref 135–145)

## 2018-11-26 LAB — CBC WITH DIFFERENTIAL/PLATELET
Abs Immature Granulocytes: 0.02 10*3/uL (ref 0.00–0.07)
Basophils Absolute: 0 10*3/uL (ref 0.0–0.1)
Basophils Relative: 1 %
Eosinophils Absolute: 0.1 10*3/uL (ref 0.0–0.5)
Eosinophils Relative: 2 %
HCT: 31.4 % — ABNORMAL LOW (ref 39.0–52.0)
Hemoglobin: 9.1 g/dL — ABNORMAL LOW (ref 13.0–17.0)
Immature Granulocytes: 0 %
Lymphocytes Relative: 13 %
Lymphs Abs: 0.8 10*3/uL (ref 0.7–4.0)
MCH: 24.1 pg — ABNORMAL LOW (ref 26.0–34.0)
MCHC: 29 g/dL — ABNORMAL LOW (ref 30.0–36.0)
MCV: 83.3 fL (ref 80.0–100.0)
Monocytes Absolute: 0.6 10*3/uL (ref 0.1–1.0)
Monocytes Relative: 9 %
Neutro Abs: 4.5 10*3/uL (ref 1.7–7.7)
Neutrophils Relative %: 75 %
Platelets: 263 10*3/uL (ref 150–400)
RBC: 3.77 MIL/uL — ABNORMAL LOW (ref 4.22–5.81)
RDW: 20.9 % — ABNORMAL HIGH (ref 11.5–15.5)
WBC: 6 10*3/uL (ref 4.0–10.5)
nRBC: 0 % (ref 0.0–0.2)

## 2018-11-26 LAB — GLUCOSE, CAPILLARY: Glucose-Capillary: 154 mg/dL — ABNORMAL HIGH (ref 70–99)

## 2018-11-26 LAB — BLOOD GAS, VENOUS
Acid-Base Excess: 4.9 mmol/L — ABNORMAL HIGH (ref 0.0–2.0)
Bicarbonate: 30.3 mmol/L — ABNORMAL HIGH (ref 20.0–28.0)
O2 Content: 1.5 L/min
O2 Saturation: 73.1 %
Patient temperature: 98.6
pCO2, Ven: 51.7 mmHg (ref 44.0–60.0)
pH, Ven: 7.386 (ref 7.250–7.430)
pO2, Ven: 43.6 mmHg (ref 32.0–45.0)

## 2018-11-26 LAB — MAGNESIUM: Magnesium: 2.2 mg/dL (ref 1.7–2.4)

## 2018-11-26 LAB — CBG MONITORING, ED: Glucose-Capillary: 123 mg/dL — ABNORMAL HIGH (ref 70–99)

## 2018-11-26 LAB — BRAIN NATRIURETIC PEPTIDE: B Natriuretic Peptide: 1064.6 pg/mL — ABNORMAL HIGH (ref 0.0–100.0)

## 2018-11-26 MED ORDER — TRAZODONE HCL 50 MG PO TABS
50.0000 mg | ORAL_TABLET | Freq: Every evening | ORAL | Status: DC | PRN
  Administered 2018-11-26: 50 mg via ORAL
  Filled 2018-11-26: qty 1

## 2018-11-26 MED ORDER — FUROSEMIDE 40 MG PO TABS
40.0000 mg | ORAL_TABLET | Freq: Once | ORAL | Status: AC
  Administered 2018-11-26: 40 mg via ORAL
  Filled 2018-11-26: qty 1

## 2018-11-26 MED ORDER — CARVEDILOL 6.25 MG PO TABS: 6.2500 mg | ORAL_TABLET | Freq: Two times a day (BID) | ORAL | 2 refills | Status: DC

## 2018-11-26 MED ORDER — CARBAMAZEPINE ER 200 MG PO TB12
200.0000 mg | ORAL_TABLET | Freq: Two times a day (BID) | ORAL | Status: DC
  Administered 2018-11-26: 12:00:00 200 mg via ORAL
  Filled 2018-11-26: qty 1

## 2018-11-26 MED ORDER — ARIPIPRAZOLE 10 MG PO TABS
10.0000 mg | ORAL_TABLET | Freq: Every day | ORAL | Status: DC
  Administered 2018-11-26: 10 mg via ORAL
  Filled 2018-11-26: qty 1

## 2018-11-26 MED ORDER — LISINOPRIL 10 MG PO TABS: 10.0000 mg | ORAL_TABLET | Freq: Every day | ORAL | 1 refills | Status: DC

## 2018-11-26 MED ORDER — FUROSEMIDE 40 MG PO TABS: 40.0000 mg | ORAL_TABLET | Freq: Two times a day (BID) | ORAL | 0 refills | Status: DC

## 2018-11-26 MED ORDER — TRAZODONE HCL 50 MG PO TABS: 50.0000 mg | ORAL_TABLET | Freq: Every evening | ORAL | Status: DC | PRN

## 2018-11-26 MED ORDER — CARBAMAZEPINE 200 MG PO TABS: 100.0000 mg | ORAL_TABLET | Freq: Two times a day (BID) | ORAL | 2 refills | Status: DC

## 2018-11-26 NOTE — Progress Notes (Signed)
Upon being transported pt received his belongings.

## 2018-11-26 NOTE — ED Notes (Signed)
Patient comes out room stating he needs to have a bowel movement. Explained to patient I will get him bedside commode. Patient refused and walked to bathroom with no assistance. Patient refusing to put back 02.

## 2018-11-26 NOTE — Progress Notes (Signed)
CSW received social work consult from Elta Guadeloupe requesting assistance with getting patient on an ACT Team. CSW called [name] to see if patient has established services with them. From their records patient has only be in contact with the crisis line. CSW is working on making referral to ACT.  Per notes, patient requested an FL2 be done for him to get into a rehab facility and at this time it does not appear he has a skillable need.   Geralyn Corwin, LCSW Transitions of Care Department [facility] ED [phone]

## 2018-11-26 NOTE — ED Notes (Signed)
Reached out to pharmacy d/t unable to pull ordered insulin out of med pyxis. This nurse requesting Novolog 2 units for sliding scale coverage CBG 123

## 2018-11-26 NOTE — Progress Notes (Signed)
Report was given to Lew Dawes RN at approximately [phone]. No further question were asked upon receiving report.

## 2018-11-26 NOTE — ED Triage Notes (Addendum)
Pt to ED via EMS, pt was at [facility] receiving care, per report O2 sats dropped to 70s, pt was placed on 4L and then O2 sats 100s. EMS worker familiar with pt, reports pt has hx of COPD, CHF, and homelessness. Per EMS worker commitment status unknown. No IVC paperwork accompanying pt.  [facility] staff member at bedside.

## 2018-11-26 NOTE — ED Notes (Signed)
Respiratory notified of collected VBG

## 2018-11-26 NOTE — Discharge Instructions (Addendum)
For your mental health needs, you are advised to follow up with [name].  Call them at your earliest opportunity to schedule an intake appointment: ° °     [name] °     [address] °     [place],  [address] °     [phone] °     Crisis number: [phone] °

## 2018-11-26 NOTE — ED Notes (Addendum)
Patient refusing vital signs before discharge. Patient verbally yelling at this nurse for OJ. Patient given OJ and excorted out of ED.  Per Effie Shy, MD patient is discharged and can leave. Patient is not going back to behavioral health.

## 2018-11-26 NOTE — BH Assessment (Signed)
[place] Assessment Progress Note  Per Juanetta Beets, DO, this pt does not require psychiatric hospitalization at this time.  Pt presents under IVC initiated by [facility] staff, which Dr Sharma Covert will be rescinding.  Pt is to be discharged from the [facility] with recommendation to continue treatment with [facility].  This has been included in pt's discharge instructions.  Pt is requesting that an FL2 be completed for him with a view toward admission to a residential facility.  Dr Sharma Covert agrees to request a social work consult for this.  Pt's nurse has been notified.  Doylene Canning, MA Triage Specialist [phone]

## 2018-11-26 NOTE — ED Provider Notes (Signed)
[facility]-EMERGENCY DEPT Provider Note   CSN: [id number] Arrival date & time: 11/26/18  4098    History   Chief Complaint Chief Complaint  Patient presents with  . Shortness of Breath    low O2 sats    HPI [name] is a 58 y.o. male.     HPI   He presents for evaluation of hypoxia.  He was at the behavioral health Hospital, being observed, overnight for homelessness and polysubstance abuse.  Apparently on routine check this morning his oxygen saturations were low so he was transferred here.  Patient feels like he is gaining weight and states "I weigh 255 pounds."  He states he is not taking his Lasix because he was unable to procure the medication.  He continues to assert that he wants to stay in the hospital, which he had complained about yesterday at the behavioral Health Center.  He denies fever, chills, cough, nausea, vomiting, weakness or dizziness.  He complains of a "congestion," in the chest region, and bilateral leg swelling.  He states he gets more short of breath when he lies down.  He continues to be homeless.  The last time he was screened for COVID 19 was 11/09/2018.  No known sick contacts.  There are no other known modifying factors.  Past Medical History:  Diagnosis Date  . Chronic foot pain   . Cocaine abuse (HCC)   . Diabetes mellitus without complication (HCC)   . Hepatitis C    unsure   . Homelessness   . Hypertension   . Neuropathy   . Polysubstance abuse (HCC)   . Schizophrenia (HCC)   . Sleep apnea   . Systolic and diastolic CHF, chronic [facility])     Patient Active Problem List   Diagnosis Date Noted  . MDD (major depressive disorder), severe (HCC) 11/25/2018  . Pressure injury of skin 11/08/2018  . Elevated troponin 10/18/2018  . HLD (hyperlipidemia) 10/18/2018  . Anxiety 10/18/2018  . CHF (congestive heart failure), NYHA class II, acute on chronic, combined (HCC) 10/18/2018  . Hypertensive urgency 10/12/2018  .  Mild renal insufficiency 10/12/2018  . Cellulitis 10/12/2018  . Penile cellulitis   . Adjustment disorder with mixed disturbance of emotions and conduct   . Sleep apnea 10/03/2018  . Hypokalemia 09/17/2018  . Scrotal swelling 09/17/2018  . Urinary hesitancy   . Constipation   . Acute on chronic combined systolic and diastolic CHF (congestive heart failure) (HCC) 08/25/2018  . Acute respiratory failure with hypoxia (HCC) 08/25/2018  . Tobacco use 08/18/2018  . Homelessness 08/08/2018  . Smoker 08/08/2018  . Prostate enlargement 03/16/2018  . Aortic atherosclerosis (HCC) 03/16/2018  . 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 Chronic foot pain   . Schizoaffective disorder, bipolar type (HCC) 09/30/2016  . Substance induced mood disorder (HCC) 03/13/2015  . Acute kidney failure (HCC) 01/26/2015  . Schizophrenia, paranoid type (HCC) 01/17/2015  . Drug hallucinosis (HCC) 10/08/2014  . Chronic paranoid schizophrenia (HCC) 09/07/2014  . 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 Urinary retention   . Cocaine use disorder, severe, dependence (HCC)   . Essential hypertension, benign 03/28/2013  . 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 MULTIPLE TOOTH EXTRACTIONS          Home Medications    Prior to Admission medications   Medication Sig Start  Date End Date Taking? Authorizing Provider  acetaminophen (TYLENOL) 500 MG tablet Take 1,000 mg by mouth every 6 (six) hours as needed for moderate pain.    [provider]  ARIPiprazole (ABILIFY) 10 MG tablet Take 1 tablet (10 mg total) by mouth daily. Patient not taking: Reported on 11/13/2018 09/18/18   Meredeth Ide, MD  aspirin EC 81 MG tablet Take 1 tablet (81 mg total) by mouth daily. Patient not taking: Reported on 11/13/2018 10/19/18 02/16/19  Meredeth Ide, MD   atorvastatin (LIPITOR) 40 MG tablet Take 1 tablet (40 mg total) by mouth daily. Patient not taking: Reported on 11/13/2018 09/18/18   Meredeth Ide, MD  carbamazepine (TEGRETOL) 200 MG tablet Take 0.5 tablets (100 mg total) by mouth 2 (two) times daily. Patient not taking: Reported on 11/13/2018 09/18/18   Meredeth Ide, MD  carvedilol (COREG) 6.25 MG tablet Take 1 tablet (6.25 mg total) by mouth 2 (two) times daily with a meal. Patient not taking: Reported on 11/13/2018 10/19/18   Meredeth Ide, MD  furosemide (LASIX) 20 MG tablet Take 3 tablets (60 mg total) by mouth daily for 30 days. Patient not taking: Reported on 11/21/2018 11/11/18 12/11/18  Michela Pitcher A, PA-C  hydrocortisone (PROCTO-PAK) 1 % CREA Apply twice a day to hemorrhoids. Patient not taking: Reported on 11/13/2018 09/29/18   Renne Crigler, PA-C  Insulin Glargine (LANTUS) 100 UNIT/ML Solostar Pen Inject 14 Units into the skin daily. Patient not taking: Reported on 11/13/2018 09/18/18   Meredeth Ide, MD  lisinopril (PRINIVIL,ZESTRIL) 10 MG tablet Take 1 tablet (10 mg total) by mouth daily. Patient not taking: Reported on 11/13/2018 09/18/18   Meredeth Ide, MD  polyethylene glycol [facility] / GLYCOLAX) packet Take 17 g by mouth 2 (two) times daily. Patient not taking: Reported on 11/13/2018 09/18/18   Meredeth Ide, MD  Potassium Chloride ER 20 MEQ TBCR Take 20 mEq by mouth daily. Take one pill twice a day for five days, then take one pill every day 11/21/18   Dione Booze, MD  tamsulosin [facility]) 0.4 MG CAPS capsule Take 1 capsule (0.4 mg total) by mouth daily after breakfast. Patient not taking: Reported on 11/13/2018 10/05/18   Narda Bonds, MD    Family History Family History  Problem Relation Age of Onset  . Hypertension Other   . Diabetes Other     Social History Social History   Tobacco Use  . Smoking status: Current Every Day Smoker    Packs/day: 1.00    Years: 20.00    Pack years: 20.00    Types: Cigarettes  . Smokeless  tobacco: Current User  Substance Use Topics  . Alcohol use: Yes    Comment: Daily Drinker   . Drug use: Yes    Frequency: 7.0 times per week    Types: "Crack" cocaine, Cocaine, Marijuana    Comment: used yesterday      Allergies   Haldol [haloperidol]   Review of Systems Review of Systems  All other systems reviewed and are negative.    Physical Exam Updated Vital Signs BP (!) 152/91 (BP Location: Right Leg)   Pulse 91   Temp 98 F (36.7 C) (Oral)   Resp 20   SpO2 95%   Physical Exam Vitals signs and nursing note reviewed.  Constitutional:      General: He is not in acute distress.    Appearance: He is well-developed. He is not ill-appearing, toxic-appearing or diaphoretic.  HENT:     Head: Normocephalic and atraumatic.     Right Ear: External ear normal.     Left Ear: External ear normal.     Mouth/Throat:     Pharynx: No pharyngeal swelling or oropharyngeal exudate.     Comments: Poor dentition with multiple cavities.  Multiple lost teeth.  No trismus.  No intraoral swelling. Eyes:     Conjunctiva/sclera: Conjunctivae normal.     Pupils: Pupils are equal, round, and reactive to light.  Neck:     Musculoskeletal: Normal range of motion and neck supple.     Thyroid: No thyromegaly.     Trachea: Phonation normal.  Cardiovascular:     Rate and Rhythm: Normal rate and regular rhythm.     Heart sounds: Normal heart sounds.  Pulmonary:     Effort: Pulmonary effort is normal. No respiratory distress.     Breath sounds: No stridor.     Comments: Decreased abnormal bilaterally with few scattered wheezes.  There is no increased work of breathing.  No distinct rales or rhonchi. Abdominal:     General: Bowel sounds are normal. There is distension.     Palpations: Abdomen is soft.     Tenderness: There is no abdominal tenderness. There is no guarding.  Musculoskeletal: Normal range of motion.     Right lower leg: Edema present.     Left lower leg: Edema present.      Comments: 3+ bilateral lower extremity swelling, thighs to feet.  Skin:    General: Skin is warm and dry.  Neurological:     Mental Status: He is alert and oriented to person, place, and time.     Cranial Nerves: No cranial nerve deficit.     Sensory: No sensory deficit.     Motor: No abnormal muscle tone.     Coordination: Coordination normal.  Psychiatric:        Mood and Affect: Mood normal.        Behavior: Behavior normal.      ED Treatments / Results  Labs (all labs ordered are listed, but only abnormal results are displayed) Labs Reviewed  GLUCOSE, CAPILLARY - Abnormal; Notable for the following components:      Result Value   Glucose-Capillary 228 (*)    All other components within normal limits  GLUCOSE, CAPILLARY - Abnormal; Notable for the following components:   Glucose-Capillary 154 (*)    All other components within normal limits    EKG None  Radiology No results found.  Procedures Procedures (including critical care time)  Medications Ordered in ED Medications  aspirin EC tablet 81 mg (81 mg Oral Given 11/26/18 0752)  lisinopril (ZESTRIL) tablet 10 mg (10 mg Oral Given 11/26/18 0752)  carvedilol (COREG) tablet 6.25 mg (6.25 mg Oral Given 11/26/18 0752)  insulin glargine (LANTUS) injection 14 Units (14 Units Subcutaneous Given 11/26/18 0757)  potassium chloride (K-DUR) CR tablet 20 mEq (20 mEq Oral Given 11/26/18 0752)  insulin aspart (novoLOG) injection 0-15 Units (3 Units Subcutaneous Given 11/26/18 [id number])  acetaminophen (TYLENOL) tablet 650 mg (650 mg Oral Given 11/26/18 0621)  traZODone (DESYREL) tablet 50 mg (50 mg Oral Given 11/26/18 0015)     Initial Impression / Assessment and Plan / ED Course  I have reviewed the triage vital signs and the nursing notes.  Pertinent labs & imaging results that were available during my care of the patient were reviewed by me and considered in my medical decision making (see chart for  details).  Clinical Course as of  Nov 25 2008  Thu Nov 26, 2018  1043 Patient noted to be sleeping with hypoxia while on nasal cannula oxygen at 2 L/min.  Saturations were in the 80s.  Patient has a known history of sleep apnea however does not appear a sleep study has been done.   [EW]    Clinical Course User Index [EW] Mancel Bale, MD        Patient Vitals for the past 24 hrs:  BP Temp Temp src Pulse Resp SpO2 Height Weight  11/26/18 0923 - - - - - -  (1.753 m) 111.6 kg  11/26/18 0921 (!) 152/91 98 F (36.7 C) Oral 91 - 95 % - -  11/26/18 0912 - - - - - 100 % - -  11/26/18 0900 - - - - - 97 % - -  11/26/18 0752 (!) 172/93 97.6 F (36.4 C) Oral 95 - (!) 85 % - -  11/26/18 0600 (!) 144/94 97.8 F (36.6 C) Oral 97 - 95 % - -  11/25/18 1356 (!) 150/105 - - 100 - - - -  11/25/18 1355 (!) 152/98 98.6 F (37 C) Oral 97 20 97 % - -   The patient was cleared for discharge home by TTS.   At D/C- Reevaluation with update and discussion. After initial assessment and treatment, an updated evaluation reveals he is comfortable and has no further complaints.  He requested refills on some of his medications which I have done.  He states he plans on going home with some family members.  All questions answered. Mancel Bale   Medical Decision Making: Substance abuse with homelessness and stable psychiatric illness.  Patient screened in the ED, and is stable.  No evidence for acute respiratory infection, heart failure, metabolic instability or impending vascular collapse.  He does have ongoing shortness of breath while sleeping, and has a documented history of sleep apnea.  There is no indication for hospitalization or intervention for this at this time.  CRITICAL CARE-no Performed by: Mancel Bale   Nursing Notes Reviewed/ Care Coordinated Applicable Imaging Reviewed Interpretation of Laboratory Data incorporated into ED treatment  The patient appears reasonably screened and/or stabilized for discharge and I doubt  any other medical condition or other [facility] requiring further screening, evaluation, or treatment in the ED at this time prior to discharge.  Plan: Home Medications-continue usual medications; Home Treatments-avoid illegal drugs; return here if the recommended treatment, does not improve the symptoms; Recommended follow up-PCP follow-up as soon as possible.     Final Clinical Impressions(s) / ED Diagnoses   Final diagnoses:  MDD (major depressive disorder), severe [facility])    ED Discharge Orders    None       Mancel Bale, MD 11/26/18 2013

## 2018-11-26 NOTE — ED Notes (Signed)
While asleep pt o2 sats dropped to 80s while on RA. 2L [place] applied.

## 2018-11-26 NOTE — Progress Notes (Addendum)
Patient ID: [name], male   DOB: 09/15/1960, 58 y.o.   MRN: [id number]  Pt was seen and chart reviewed with treatment team and Dr Sharma Covert. Pt denies suicidal/homicidal ideation, denies auditory/visual hallucinations and does not appear to be responding to internal stimuli. Pt was transferred from [facility] this morning to [facility] due to a drop in O2 sats. He told TTS yesterday that he is saying he is suicidal in order to be placed in a SNF. He is well known to this hospital system and has a high degree of secondary gain by seeking shelter and food in the emergency room. He has had 59 ED visits in 6 months. He receives disability but frequently spends all of his check on cocaine and then presents to the ED daily until the first of the month. Pt became agitated when on camera this morning and stated "turn that thing off, they ain't gonna help me get into a rest home." Pt is psychiatrically clear.   Laveda Abbe, FNP-C 11/26/2018      1118   Patient seen by telemedicine, chart reviewed and case discussed with the physician extender and developed treatment plan. Reviewed the information documented and agree with the treatment plan.  Juanetta Beets, DO 11/26/18 3:56 PM

## 2018-11-26 NOTE — ED Notes (Addendum)
CBG 123 

## 2018-11-26 NOTE — ED Notes (Signed)
Bed: UD14 Expected date:  Expected time:  Means of arrival:  Comments: EMS-COPD/PNA

## 2018-11-26 NOTE — Progress Notes (Signed)
At 0752 pt's O2 staturation was 85%. Pt was then placed on oxygen in order to get O2 saturation WNL. At time of transportations (EMS) arrival pt was receiving 3L of oxygen and saturations were at 97%. AC was notified at time of decline in pt's O2 saturations and that pt was going to be placed on oxygen to assist pt in getting his oxygen levels by to normal limits.

## 2018-11-28 ENCOUNTER — Encounter (HOSPITAL_COMMUNITY): Payer: Self-pay

## 2018-11-28 ENCOUNTER — Emergency Department (HOSPITAL_COMMUNITY)
Admission: EM | Admit: 2018-11-28 | Discharge: 2018-11-28 | Disposition: A | Discharge status: Home / Self Care | Payer: Medicaid Other | Attending: Emergency Medicine | Admitting: Emergency Medicine

## 2018-11-28 ENCOUNTER — Emergency Department (HOSPITAL_COMMUNITY)
Admission: EM | Admit: 2018-11-28 | Discharge: 2018-11-29 | Disposition: A | Discharge status: Home / Self Care | Payer: Medicaid Other | Source: Home / Self Care | Attending: Emergency Medicine | Admitting: Emergency Medicine

## 2018-11-28 ENCOUNTER — Other Ambulatory Visit: Payer: Self-pay

## 2018-11-28 DIAGNOSIS — E119 Type 2 diabetes mellitus without complications: Secondary | ICD-10-CM | POA: Insufficient documentation

## 2018-11-28 DIAGNOSIS — F17210 Nicotine dependence, cigarettes, uncomplicated: Secondary | ICD-10-CM | POA: Insufficient documentation

## 2018-11-28 DIAGNOSIS — N50819 Testicular pain, unspecified: Secondary | ICD-10-CM | POA: Insufficient documentation

## 2018-11-28 DIAGNOSIS — R609 Edema, unspecified: Secondary | ICD-10-CM

## 2018-11-28 DIAGNOSIS — Z5321 Procedure and treatment not carried out due to patient leaving prior to being seen by health care provider: Secondary | ICD-10-CM | POA: Diagnosis not present

## 2018-11-28 DIAGNOSIS — Z79899 Other long term (current) drug therapy: Secondary | ICD-10-CM | POA: Insufficient documentation

## 2018-11-28 DIAGNOSIS — R45851 Suicidal ideations: Secondary | ICD-10-CM | POA: Insufficient documentation

## 2018-11-28 DIAGNOSIS — F17220 Nicotine dependence, chewing tobacco, uncomplicated: Secondary | ICD-10-CM | POA: Insufficient documentation

## 2018-11-28 DIAGNOSIS — I5042 Chronic combined systolic (congestive) and diastolic (congestive) heart failure: Secondary | ICD-10-CM | POA: Insufficient documentation

## 2018-11-28 DIAGNOSIS — I110 Hypertensive heart disease with heart failure: Secondary | ICD-10-CM | POA: Insufficient documentation

## 2018-11-28 LAB — RAPID URINE DRUG SCREEN, HOSP PERFORMED
Amphetamines: NOT DETECTED
Barbiturates: NOT DETECTED
Benzodiazepines: NOT DETECTED
Cocaine: POSITIVE — AB
Opiates: NOT DETECTED
Tetrahydrocannabinol: NOT DETECTED

## 2018-11-28 LAB — COMPREHENSIVE METABOLIC PANEL
ALT: 23 U/L (ref 0–44)
AST: 49 U/L — ABNORMAL HIGH (ref 15–41)
Albumin: 2.6 g/dL — ABNORMAL LOW (ref 3.5–5.0)
Alkaline Phosphatase: 146 U/L — ABNORMAL HIGH (ref 38–126)
Anion gap: 7 (ref 5–15)
BUN: 26 mg/dL — ABNORMAL HIGH (ref 6–20)
CO2: 28 mmol/L (ref 22–32)
Calcium: 8.5 mg/dL — ABNORMAL LOW (ref 8.9–10.3)
Chloride: 103 mmol/L (ref 98–111)
Creatinine, Ser: 1.29 mg/dL — ABNORMAL HIGH (ref 0.61–1.24)
GFR calc Af Amer: >60 mL/min (ref >60)
GFR calc non Af Amer: >60 mL/min (ref >60)
Glucose, Bld: 292 mg/dL — ABNORMAL HIGH (ref 70–99)
Potassium: 4 mmol/L (ref 3.5–5.1)
Sodium: 138 mmol/L (ref 135–145)
Total Bilirubin: 0.9 mg/dL (ref 0.3–1.2)
Total Protein: 6.5 g/dL (ref 6.5–8.1)

## 2018-11-28 LAB — CBC
HCT: 30.7 % — ABNORMAL LOW (ref 39.0–52.0)
Hemoglobin: 9.1 g/dL — ABNORMAL LOW (ref 13.0–17.0)
MCH: 23.5 pg — ABNORMAL LOW (ref 26.0–34.0)
MCHC: 29.6 g/dL — ABNORMAL LOW (ref 30.0–36.0)
MCV: 79.3 fL — ABNORMAL LOW (ref 80.0–100.0)
Platelets: 331 10*3/uL (ref 150–400)
RBC: 3.87 MIL/uL — ABNORMAL LOW (ref 4.22–5.81)
RDW: 20.4 % — ABNORMAL HIGH (ref 11.5–15.5)
WBC: 6.4 10*3/uL (ref 4.0–10.5)
nRBC: 0.8 % — ABNORMAL HIGH (ref 0.0–0.2)

## 2018-11-28 LAB — SALICYLATE LEVEL: Salicylate Lvl: <7 mg/dL (ref 2.8–30.0)

## 2018-11-28 LAB — ETHANOL: Alcohol, Ethyl (B): <10 mg/dL (ref <10)

## 2018-11-28 LAB — ACETAMINOPHEN LEVEL: Acetaminophen (Tylenol), Serum: <10 ug/mL — ABNORMAL LOW (ref 10–30)

## 2018-11-28 MED ORDER — FUROSEMIDE 10 MG/ML IJ SOLN
20.0000 mg | Freq: Once | INTRAMUSCULAR | Status: AC
  Administered 2018-11-28: 22:00:00 20 mg via INTRAVENOUS
  Filled 2018-11-28: qty 2

## 2018-11-28 NOTE — ED Triage Notes (Signed)
Pt states he was wearing someone else' jeans that were too tight and began having testicular pain. Pain began today.  EMS states they picked him up from jail. Pt tried to trespass in order to be arrested. Jail did not take him and he began to have pain so EMS was called

## 2018-11-28 NOTE — ED Notes (Signed)
Pt refusing vital signs prior to DC and verbally assaultive to staff.

## 2018-11-28 NOTE — ED Notes (Signed)
Pt escorted out in wheelchair.

## 2018-11-28 NOTE — Discharge Instructions (Signed)
Get help right away if: °You develop shortness of breath or chest pain. °You cannot breathe when you lie down. °You develop pain, redness, or warmth in the swollen areas. °You have heart, liver, or kidney disease and suddenly get edema. °You have a fever and your symptoms suddenly get worse. °

## 2018-11-28 NOTE — ED Notes (Signed)
Pt again came out of his room but dressed stating he is going to leave. Pt ambulatory with steady gait. Tech did use a wheelchair to take him to lobby.

## 2018-11-28 NOTE — ED Provider Notes (Signed)
[facility] EMERGENCY DEPARTMENT Provider Note   CSN: [id number] Arrival date & time: 11/28/18  1902    History   Chief Complaint Chief Complaint  Patient presents with  . Groin Swelling  . Suicidal    HPI [name] is a 58 y.o. male with a past medical history of its of hernia, cocaine and polysubstance abuse, diabetes, chronic anasarca and volume overload with poor medication compliance.  He is very well-known to this emergency department and has an active care plan.  Patient is complaining of swelling in his genitals.  At this point he denies that he scraped his genitals while getting into a taxicab.  He also currently denies any other complaints including suicidality.  Patient states that he has not been taking his medications.     HPI  Past Medical History:  Diagnosis Date  . Chronic foot pain   . Cocaine abuse (HCC)   . Diabetes mellitus without complication (HCC)   . Hepatitis C    unsure   . Homelessness   . Hypertension   . Neuropathy   . Polysubstance abuse (HCC)   . Schizophrenia (HCC)   . Sleep apnea   . Systolic and diastolic CHF, chronic [facility])     Patient Active Problem List   Diagnosis Date Noted  . MDD (major depressive disorder), severe (HCC) 11/25/2018  . Pressure injury of skin 11/08/2018  . Elevated troponin 10/18/2018  . HLD (hyperlipidemia) 10/18/2018  . Anxiety 10/18/2018  . CHF (congestive heart failure), NYHA class II, acute on chronic, combined (HCC) 10/18/2018  . Hypertensive urgency 10/12/2018  . Mild renal insufficiency 10/12/2018  . Cellulitis 10/12/2018  . Penile cellulitis   . Adjustment disorder with mixed disturbance of emotions and conduct   . Sleep apnea 10/03/2018  . Hypokalemia 09/17/2018  . Scrotal swelling 09/17/2018  . Urinary hesitancy   . Constipation   . Acute on chronic combined systolic and diastolic CHF (congestive heart failure) (HCC) 08/25/2018  . Acute respiratory failure with  hypoxia (HCC) 08/25/2018  . Tobacco use 08/18/2018  . Homelessness 08/08/2018  . Smoker 08/08/2018  . Prostate enlargement 03/16/2018  . Aortic atherosclerosis (HCC) 03/16/2018  . 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 Chronic foot pain   . Schizoaffective disorder, bipolar type (HCC) 09/30/2016  . Substance induced mood disorder (HCC) 03/13/2015  . Acute kidney failure (HCC) 01/26/2015  . Schizophrenia, paranoid type (HCC) 01/17/2015  . Drug hallucinosis (HCC) 10/08/2014  . Chronic paranoid schizophrenia (HCC) 09/07/2014  . 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 Urinary retention   . Cocaine use disorder, severe, dependence (HCC)   . Essential hypertension, benign 03/28/2013  . 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 MULTIPLE TOOTH EXTRACTIONS          Home Medications    Prior to Admission medications   Medication Sig Start Date End Date Taking? Authorizing Provider  acetaminophen (TYLENOL) 500 MG tablet Take 1,000 mg by mouth every 6 (six) hours as needed for moderate pain.    [provider]  ARIPiprazole (ABILIFY) 10 MG tablet Take 1 tablet (10 mg total) by mouth daily. Patient not taking: Reported on 11/13/2018 09/18/18   Meredeth Ide, MD  aspirin EC 81 MG tablet Take 1 tablet (81 mg total) by mouth daily. Patient not taking: Reported on 11/13/2018 10/19/18 02/16/19  Meredeth Ide, MD  atorvastatin (LIPITOR) 40 MG tablet Take 1 tablet (40 mg total) by mouth daily. Patient not taking: Reported on 11/13/2018 09/18/18   Meredeth Ide, MD  carbamazepine (TEGRETOL) 200 MG tablet Take 0.5 tablets (100 mg total) by mouth 2 (two) times daily. 11/26/18   Mancel Bale, MD  carvedilol (COREG) 6.25 MG tablet Take 1 tablet (6.25 mg total) by mouth 2 (two) times daily with a meal. 11/26/18   Mancel Bale, MD   doxycycline (VIBRAMYCIN) 100 MG capsule Take 1 capsule (100 mg total) by mouth 2 (two) times daily. One po bid x 7 days 11/30/18   Palumbo, April, MD  furosemide (LASIX) 40 MG tablet Take 1 tablet (40 mg total) by mouth 2 (two) times daily for 30 days. 11/26/18 12/26/18  Mancel Bale, MD  hydrocortisone (PROCTO-PAK) 1 % CREA Apply twice a day to hemorrhoids. Patient not taking: Reported on 11/13/2018 09/29/18   Renne Crigler, PA-C  Insulin Glargine (LANTUS) 100 UNIT/ML Solostar Pen Inject 14 Units into the skin daily. Patient not taking: Reported on 11/13/2018 09/18/18   Meredeth Ide, MD  Ipratropium-Albuterol (COMBIVENT) 20-100 MCG/ACT AERS respimat Inhale 1 puff into the lungs every 6 (six) hours. 11/30/18   Palumbo, April, MD  lisinopril (ZESTRIL) 10 MG tablet Take 1 tablet (10 mg total) by mouth daily. 11/26/18   Mancel Bale, MD  polyethylene glycol [facility] Ethelene Hal) packet Take 17 g by mouth 2 (two) times daily. Patient not taking: Reported on 11/13/2018 09/18/18   Meredeth Ide, MD  Potassium Chloride ER 20 MEQ TBCR Take 20 mEq by mouth daily. Take one pill twice a day for five days, then take one pill every day 11/21/18   Dione Booze, MD  potassium chloride SA (K-DUR) 20 MEQ tablet Take 1 tablet (20 mEq total) by mouth 2 (two) times daily. 11/30/18   Palumbo, April, MD  predniSONE (DELTASONE) 20 MG tablet 3 tabs po day one, then 2 po daily x 4 days 11/30/18   Palumbo, April, MD  tamsulosin (FLOMAX) 0.4 MG CAPS capsule Take 1 capsule (0.4 mg total) by mouth daily after breakfast. Patient not taking: Reported on 11/13/2018 10/05/18   Narda Bonds, MD    Family History Family History  Problem Relation Age of Onset  . Hypertension Other   . Diabetes Other     Social History Social History   Tobacco Use  . Smoking status: Current Every Day Smoker    Packs/day: 1.00    Years: 20.00    Pack years: 20.00    Types: Cigarettes  . Smokeless tobacco: Current User  Substance Use Topics  .  Alcohol use: Yes    Comment: Daily Drinker   . Drug use: Yes    Frequency: 7.0 times per week    Types: "Crack" cocaine, Cocaine, Marijuana    Comment: used cocaine 11-27-18     Allergies   Haldol [haloperidol]   Review of Systems Review of Systems  Ten systems reviewed and are negative for acute change, except as noted in the HPI.   Physical Exam Updated Vital Signs BP (!) 138/101   Pulse 98   Temp 98.5 F (36.9 C) (Oral)   Resp 16   SpO2 98%   Physical Exam Vitals signs and nursing note reviewed.  Constitutional:      General: He is not in acute distress.    Appearance: He is well-developed. He is not diaphoretic.  HENT:     Head:  Normocephalic and atraumatic.     Comments: Multiple missing teeth Eyes:     General: No scleral icterus.    Conjunctiva/sclera: Conjunctivae normal.  Neck:     Musculoskeletal: Normal range of motion and neck supple.     Comments: JVD Cardiovascular:     Rate and Rhythm: Normal rate and regular rhythm.     Heart sounds: Normal heart sounds.  Pulmonary:     Effort: Pulmonary effort is normal. No respiratory distress.     Breath sounds: Wheezing present.  Abdominal:     Palpations: Abdomen is soft.     Tenderness: There is no abdominal tenderness.  Genitourinary:    Comments: Global genital edema.  Near progress no evidence of tissue breakdown or abrasions Musculoskeletal:     Right lower leg: Edema present.     Left lower leg: Edema present.  Skin:    General: Skin is warm and dry.  Neurological:     Mental Status: He is alert.  Psychiatric:        Behavior: Behavior normal.      ED Treatments / Results  Labs (all labs ordered are listed, but only abnormal results are displayed) Labs Reviewed  COMPREHENSIVE METABOLIC PANEL - Abnormal; Notable for the following components:      Result Value   Glucose, Bld 292 (*)    BUN 26 (*)    Creatinine, Ser 1.29 (*)    Calcium 8.5 (*)    Albumin 2.6 (*)    AST 49 (*)     Alkaline Phosphatase 146 (*)    All other components within normal limits  ACETAMINOPHEN LEVEL - Abnormal; Notable for the following components:   Acetaminophen (Tylenol), Serum <10 (*)    All other components within normal limits  CBC - Abnormal; Notable for the following components:   RBC 3.87 (*)    Hemoglobin 9.1 (*)    HCT 30.7 (*)    MCV 79.3 (*)    MCH 23.5 (*)    MCHC 29.6 (*)    RDW 20.4 (*)    nRBC 0.8 (*)    All other components within normal limits  RAPID URINE DRUG SCREEN, HOSP PERFORMED - Abnormal; Notable for the following components:   Cocaine POSITIVE (*)    All other components within normal limits  ETHANOL  SALICYLATE LEVEL    EKG None  Radiology Dg Chest Portable 1 View  Result Date: 11/30/2018 CLINICAL DATA:  58 year old male with shortness of breath and wheezing. Recent cocaine use. EXAM: PORTABLE CHEST 1 VIEW COMPARISON:  11/26/2018 and earlier. FINDINGS: Portable AP semi upright view at 0532 hours. Stable cardiomegaly and mediastinal contours. Stable lung volumes and lung markings. Chronic linear atelectasis or scarring in the left mid lung. No acute pulmonary opacity. No pneumothorax or pleural effusion identified. Paucity of bowel gas in the upper abdomen. No acute osseous abnormality identified. IMPRESSION: Stable cardiomegaly. No acute cardiopulmonary abnormality. Electronically Signed   By: Odessa Fleming M.D.   On: 11/30/2018 06:27    Procedures Procedures (including critical care time)  Medications Ordered in ED Medications  furosemide (LASIX) injection 20 mg (20 mg Intravenous Given 11/28/18 2200)     Initial Impression / Assessment and Plan / ED Course  I have reviewed the triage vital signs and the nursing notes.  Pertinent labs & imaging results that were available during my care of the patient were reviewed by me and considered in my medical decision making (see chart for details).  Clinical  Course as of Nov 30 1015  Sat Nov 28, 2018  2254  During the visit I asked the patient directly if he felt suicidal.  He denied suicidality at the time however at discharge patient started to tell the nurse that he is suicidal.  I have low suspicion that he will commit the act of taking his life and feel that he is likely very safe for discharge at this time.   [AH]    Clinical Course User Index [AH] Arthor CaptainHarris, Dawanda Mapel, PA-C       Zollie ScaleFred Philippi is a well-known patient to this emergency system with over 77 visits in the last 6 months.  He quickly threatens suicide at discharge.  He is homeless has multiple old other psychiatric and social burdens, including medication non-compliance. I have reviewed the patient's labs which appear to be at baseline, including his UDS which is nearly always positive for cocaine.. The patient does appear to be volume overloaded but has been given IV Lasix in the urinal.  Suspect he will have good output and has some mild hypertension without tachypnea or hypoxia on room air. I have very low concern for completed suicide in this patient. Patient appears appropriate for discharge  Final Clinical Impressions(s) / ED Diagnoses   Final diagnoses:  Edema, unspecified type    ED Discharge Orders    None       Arthor CaptainHarris, Deya Bigos, PA-C 11/30/18 1017    Melene PlanFloyd, Dan, DO 11/30/18 1306

## 2018-11-28 NOTE — ED Notes (Signed)
Pt standing at doorway yelling for tylenol for his pain. This RN explained to pt he must wait for provider. Pt states he wants to leave. Gave Pt blue scrubs and socks.   Pt now states he will stay and wait.

## 2018-11-28 NOTE — ED Triage Notes (Addendum)
To triage via EMS.  Pt returns to the ED today for groin pain.  Pt reports there is an abrasion on right groin.  States he scraped groin getting in and out of taxi.   Scrotum is swollen.   Pt also endorses SI.  No plan.

## 2018-11-30 ENCOUNTER — Other Ambulatory Visit: Payer: Self-pay

## 2018-11-30 ENCOUNTER — Emergency Department (HOSPITAL_COMMUNITY): Payer: Medicaid Other

## 2018-11-30 ENCOUNTER — Encounter (HOSPITAL_COMMUNITY): Payer: Self-pay | Admitting: Emergency Medicine

## 2018-11-30 ENCOUNTER — Emergency Department (HOSPITAL_COMMUNITY)
Admission: EM | Admit: 2018-11-30 | Discharge: 2018-11-30 | Disposition: A | Discharge status: Home / Self Care | Payer: Medicaid Other | Attending: Emergency Medicine | Admitting: Emergency Medicine

## 2018-11-30 DIAGNOSIS — E119 Type 2 diabetes mellitus without complications: Secondary | ICD-10-CM | POA: Diagnosis not present

## 2018-11-30 DIAGNOSIS — I110 Hypertensive heart disease with heart failure: Secondary | ICD-10-CM | POA: Insufficient documentation

## 2018-11-30 DIAGNOSIS — F17210 Nicotine dependence, cigarettes, uncomplicated: Secondary | ICD-10-CM | POA: Diagnosis not present

## 2018-11-30 DIAGNOSIS — J441 Chronic obstructive pulmonary disease with (acute) exacerbation: Secondary | ICD-10-CM | POA: Insufficient documentation

## 2018-11-30 DIAGNOSIS — Z794 Long term (current) use of insulin: Secondary | ICD-10-CM | POA: Diagnosis not present

## 2018-11-30 DIAGNOSIS — Z79899 Other long term (current) drug therapy: Secondary | ICD-10-CM | POA: Insufficient documentation

## 2018-11-30 DIAGNOSIS — E876 Hypokalemia: Secondary | ICD-10-CM | POA: Insufficient documentation

## 2018-11-30 DIAGNOSIS — I5040 Unspecified combined systolic (congestive) and diastolic (congestive) heart failure: Secondary | ICD-10-CM | POA: Insufficient documentation

## 2018-11-30 DIAGNOSIS — Z7982 Long term (current) use of aspirin: Secondary | ICD-10-CM | POA: Diagnosis not present

## 2018-11-30 DIAGNOSIS — R0602 Shortness of breath: Secondary | ICD-10-CM | POA: Diagnosis present

## 2018-11-30 LAB — CBC WITH DIFFERENTIAL/PLATELET
Abs Immature Granulocytes: 0.02 10*3/uL (ref 0.00–0.07)
Basophils Absolute: 0 10*3/uL (ref 0.0–0.1)
Basophils Relative: 0 %
Eosinophils Absolute: 0 10*3/uL (ref 0.0–0.5)
Eosinophils Relative: 0 %
HCT: 32.2 % — ABNORMAL LOW (ref 39.0–52.0)
Hemoglobin: 9.2 g/dL — ABNORMAL LOW (ref 13.0–17.0)
Immature Granulocytes: 0 %
Lymphocytes Relative: 10 %
Lymphs Abs: 0.8 10*3/uL (ref 0.7–4.0)
MCH: 23.7 pg — ABNORMAL LOW (ref 26.0–34.0)
MCHC: 28.6 g/dL — ABNORMAL LOW (ref 30.0–36.0)
MCV: 82.8 fL (ref 80.0–100.0)
Monocytes Absolute: 0.8 10*3/uL (ref 0.1–1.0)
Monocytes Relative: 10 %
Neutro Abs: 6.2 10*3/uL (ref 1.7–7.7)
Neutrophils Relative %: 80 %
Platelets: 301 10*3/uL (ref 150–400)
RBC: 3.89 MIL/uL — ABNORMAL LOW (ref 4.22–5.81)
RDW: 20.8 % — ABNORMAL HIGH (ref 11.5–15.5)
WBC: 7.9 10*3/uL (ref 4.0–10.5)
nRBC: 0.4 % — ABNORMAL HIGH (ref 0.0–0.2)

## 2018-11-30 LAB — TROPONIN I: Troponin I: <0.03 ng/mL (ref <0.03)

## 2018-11-30 LAB — BASIC METABOLIC PANEL
Anion gap: 5 (ref 5–15)
BUN: 24 mg/dL — ABNORMAL HIGH (ref 6–20)
CO2: 24 mmol/L (ref 22–32)
Calcium: 6.9 mg/dL — ABNORMAL LOW (ref 8.9–10.3)
Chloride: 112 mmol/L — ABNORMAL HIGH (ref 98–111)
Creatinine, Ser: 0.88 mg/dL (ref 0.61–1.24)
GFR calc Af Amer: >60 mL/min (ref >60)
GFR calc non Af Amer: >60 mL/min (ref >60)
Glucose, Bld: 219 mg/dL — ABNORMAL HIGH (ref 70–99)
Potassium: 2.7 mmol/L — CL (ref 3.5–5.1)
Sodium: 141 mmol/L (ref 135–145)

## 2018-11-30 MED ORDER — POTASSIUM CHLORIDE CRYS ER 20 MEQ PO TBCR
120.0000 meq | EXTENDED_RELEASE_TABLET | Freq: Once | ORAL | Status: AC
  Administered 2018-11-30: 07:00:00 120 meq via ORAL
  Filled 2018-11-30: qty 6

## 2018-11-30 MED ORDER — POTASSIUM CHLORIDE CRYS ER 20 MEQ PO TBCR: 20.0000 meq | EXTENDED_RELEASE_TABLET | Freq: Two times a day (BID) | ORAL | 0 refills | Status: DC

## 2018-11-30 MED ORDER — IPRATROPIUM-ALBUTEROL 20-100 MCG/ACT IN AERS: 1.0000 | INHALATION_SPRAY | Freq: Four times a day (QID) | RESPIRATORY_TRACT | 0 refills | Status: DC

## 2018-11-30 MED ORDER — IPRATROPIUM BROMIDE HFA 17 MCG/ACT IN AERS
4.0000 | INHALATION_SPRAY | Freq: Once | RESPIRATORY_TRACT | Status: AC
  Administered 2018-11-30: 4 via RESPIRATORY_TRACT
  Filled 2018-11-30: qty 12.9

## 2018-11-30 MED ORDER — AEROCHAMBER PLUS FLO-VU LARGE MISC
1.0000 | Freq: Once | Status: AC
  Administered 2018-11-30: 1
  Filled 2018-11-30: qty 1

## 2018-11-30 MED ORDER — DOXYCYCLINE HYCLATE 100 MG PO CAPS: 100.0000 mg | ORAL_CAPSULE | Freq: Two times a day (BID) | ORAL | 0 refills | Status: DC

## 2018-11-30 MED ORDER — ALBUTEROL SULFATE HFA 108 (90 BASE) MCG/ACT IN AERS
10.0000 | INHALATION_SPRAY | Freq: Once | RESPIRATORY_TRACT | Status: AC
  Administered 2018-11-30: 10 via RESPIRATORY_TRACT
  Filled 2018-11-30: qty 6.7

## 2018-11-30 MED ORDER — ALBUTEROL SULFATE HFA 108 (90 BASE) MCG/ACT IN AERS
8.0000 | INHALATION_SPRAY | Freq: Once | RESPIRATORY_TRACT | Status: AC
  Administered 2018-11-30: 8 via RESPIRATORY_TRACT

## 2018-11-30 MED ORDER — PREDNISONE 20 MG PO TABS: ORAL_TABLET | ORAL | 0 refills | Status: DC

## 2018-11-30 NOTE — ED Notes (Signed)
Patient refused to sign DC paperwork, educated on need to fill prescriptions.

## 2018-11-30 NOTE — ED Notes (Signed)
Bed: SF42 Expected date:  Expected time:  Means of arrival:  Comments: EMS: 58 y/o SOB

## 2018-11-30 NOTE — ED Provider Notes (Signed)
[facility]-EMERGENCY DEPT Provider Note   CSN: [id number] Arrival date & time: 11/30/18  0445    History   Chief Complaint Chief Complaint  Patient presents with   Shortness of Breath    HPI [name] is a 58 y.o. male.     The history is provided by the patient.  Shortness of Breath  Severity:  Severe Onset quality:  Sudden Timing:  Constant Progression:  Unchanged Chronicity:  Recurrent Context: not activity   Context comment:  Crack usage   Relieved by:  Nothing Worsened by:  Nothing Ineffective treatments:  None tried Associated symptoms: wheezing   Associated symptoms: no chest pain, no cough, no fever, no sore throat and no sputum production   Given mag, solumedrol and epi en route.  Is already comfortable and preferring to sleep upon arrival.    Past Medical History:  Diagnosis Date   Chronic foot pain    Cocaine abuse (HCC)    Diabetes mellitus without complication (HCC)    Hepatitis C    unsure    Homelessness    Hypertension    Neuropathy    Polysubstance abuse (HCC)    Schizophrenia (HCC)    Sleep apnea    Systolic and diastolic CHF, chronic (HCC)     Patient Active Problem List   Diagnosis Date Noted   MDD (major depressive disorder), severe (HCC) 11/25/2018   Pressure injury of skin 11/08/2018   Elevated troponin 10/18/2018   HLD (hyperlipidemia) 10/18/2018   Anxiety 10/18/2018   CHF (congestive heart failure), NYHA class II, acute on chronic, combined (HCC) 10/18/2018   Hypertensive urgency 10/12/2018   Mild renal insufficiency 10/12/2018   Cellulitis 10/12/2018   Penile cellulitis    Adjustment disorder with mixed disturbance of emotions and conduct    Sleep apnea 10/03/2018   Hypokalemia 09/17/2018   Scrotal swelling 09/17/2018   Urinary hesitancy    Constipation    Acute on chronic combined systolic and diastolic CHF (congestive heart failure) (HCC) 08/25/2018   Acute  respiratory failure with hypoxia (HCC) 08/25/2018   Tobacco use 08/18/2018   Homelessness 08/08/2018   Smoker 08/08/2018   Prostate enlargement 03/16/2018   Aortic atherosclerosis (HCC) 03/16/2018   Aneurysm of abdominal aorta (HCC) 03/16/2018   Chronic foot pain    Schizoaffective disorder, bipolar type (HCC) 09/30/2016   Substance induced mood disorder (HCC) 03/13/2015   Acute kidney failure (HCC) 01/26/2015   Schizophrenia, paranoid type (HCC) 01/17/2015   Drug hallucinosis (HCC) 10/08/2014   Chronic paranoid schizophrenia (HCC) 09/07/2014   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 Authorizing Provider  acetaminophen (TYLENOL) 500 MG tablet Take 1,000 mg by mouth every 6 (six) hours as needed for moderate pain.    [provider]  ARIPiprazole (ABILIFY) 10 MG tablet Take 1 tablet (10 mg total) by mouth daily. Patient not taking: Reported on 11/13/2018 09/18/18   Meredeth Ide, MD  aspirin EC 81 MG tablet Take 1 tablet (81 mg total) by mouth daily. Patient not  taking: Reported on 11/13/2018 10/19/18 02/16/19  Meredeth Ide, MD  atorvastatin (LIPITOR) 40 MG tablet Take 1 tablet (40 mg total) by mouth daily. Patient not taking: Reported on 11/13/2018 09/18/18   Meredeth Ide, MD  carbamazepine (TEGRETOL) 200 MG tablet Take 0.5 tablets (100 mg total) by mouth 2 (two) times daily. 11/26/18   Mancel Bale, MD  carvedilol (COREG) 6.25 MG tablet Take 1 tablet (6.25 mg total) by mouth 2 (two) times daily with a meal.  11/26/18   Mancel Bale, MD  furosemide (LASIX) 40 MG tablet Take 1 tablet (40 mg total) by mouth 2 (two) times daily for 30 days. 11/26/18 12/26/18  Mancel Bale, MD  hydrocortisone (PROCTO-PAK) 1 % CREA Apply twice a day to hemorrhoids. Patient not taking: Reported on 11/13/2018 09/29/18   Renne Crigler, PA-C  Insulin Glargine (LANTUS) 100 UNIT/ML Solostar Pen Inject 14 Units into the skin daily. Patient not taking: Reported on 11/13/2018 09/18/18   Meredeth Ide, MD  lisinopril (ZESTRIL) 10 MG tablet Take 1 tablet (10 mg total) by mouth daily. 11/26/18   Mancel Bale, MD  polyethylene glycol [facility] Hal) packet Take 17 g by mouth 2 (two) times daily. Patient not taking: Reported on 11/13/2018 09/18/18   Meredeth Ide, MD  Potassium Chloride ER 20 MEQ TBCR Take 20 mEq by mouth daily. Take one pill twice a day for five days, then take one pill every day 11/21/18   Dione Booze, MD  tamsulosin [facility]) 0.4 MG CAPS capsule Take 1 capsule (0.4 mg total) by mouth daily after breakfast. Patient not taking: Reported on 11/13/2018 10/05/18   Narda Bonds, MD    Family History Family History  Problem Relation Age of Onset   Hypertension Other    Diabetes Other     Social History Social History   Tobacco Use   Smoking status: Current Every Day Smoker    Packs/day: 1.00    Years: 20.00    Pack years: 20.00    Types: Cigarettes   Smokeless tobacco: Current User  Substance Use Topics   Alcohol use: Yes    Comment: Daily Drinker    Drug use: Yes    Frequency: 7.0 times per week    Types: "Crack" cocaine, Cocaine, Marijuana    Comment: used cocaine 11-27-18     Allergies   Haldol [haloperidol]   Review of Systems Review of Systems  Constitutional: Negative for fever.  HENT: Negative for sore throat and trouble swallowing.   Respiratory: Positive for shortness of breath and wheezing. Negative for cough and sputum production.   Cardiovascular: Negative for chest pain,  palpitations and leg swelling.  All other systems reviewed and are negative.    Physical Exam Updated Vital Signs BP (!) 174/94 (BP Location: Left Arm)    Pulse 65    Temp 98.2 F (36.8 C) (Oral)    Resp 19    SpO2 93%   Physical Exam Vitals signs and nursing note reviewed.  Constitutional:      General: He is not in acute distress.    Appearance: He is normal weight.  HENT:     Head: Normocephalic and atraumatic.     Nose: Nose normal.     Mouth/Throat:     Mouth: Mucous membranes are moist.  Eyes:     Pupils: Pupils are equal, round, and reactive to light.  Neck:     Musculoskeletal: Normal range of motion.  Cardiovascular:     Rate  and Rhythm: Normal rate and regular rhythm.     Pulses: Normal pulses.     Heart sounds: Normal heart sounds.  Pulmonary:     Effort: Pulmonary effort is normal. No respiratory distress.     Breath sounds: Wheezing present.  Abdominal:     General: Abdomen is flat. Bowel sounds are normal.     Tenderness: There is no abdominal tenderness. There is no guarding.  Musculoskeletal: Normal range of motion.  Skin:    General: Skin is warm and dry.     Capillary Refill: Capillary refill takes less than 2 seconds.  Neurological:     General: No focal deficit present.     Mental Status: He is alert and oriented to person, place, and time.  Psychiatric:        Mood and Affect: Mood normal.        Behavior: Behavior normal.      ED Treatments / Results  Labs (all labs ordered are listed, but only abnormal results are displayed) Results for orders placed or performed during the hospital encounter of 11/30/18  CBC with Differential/Platelet  Result Value Ref Range   WBC 7.9 4.0 - 10.5 K/uL   RBC 3.89 (L) 4.22 - 5.81 MIL/uL   Hemoglobin 9.2 (L) 13.0 - 17.0 g/dL   HCT 82.4 (L) 23.5 - 36.1 %   MCV 82.8 80.0 - 100.0 fL   MCH 23.7 (L) 26.0 - 34.0 pg   MCHC 28.6 (L) 30.0 - 36.0 g/dL   RDW 44.3 (H) 15.4 - 00.8 %   Platelets 301 150 - 400 K/uL     nRBC 0.4 (H) 0.0 - 0.2 %   Neutrophils Relative % 80 %   Neutro Abs 6.2 1.7 - 7.7 K/uL   Lymphocytes Relative 10 %   Lymphs Abs 0.8 0.7 - 4.0 K/uL   Monocytes Relative 10 %   Monocytes Absolute 0.8 0.1 - 1.0 K/uL   Eosinophils Relative 0 %   Eosinophils Absolute 0.0 0.0 - 0.5 K/uL   Basophils Relative 0 %   Basophils Absolute 0.0 0.0 - 0.1 K/uL   Immature Granulocytes 0 %   Abs Immature Granulocytes 0.02 0.00 - 0.07 K/uL  Basic metabolic panel  Result Value Ref Range   Sodium 141 135 - 145 mmol/L   Potassium 2.7 (LL) 3.5 - 5.1 mmol/L   Chloride 112 (H) 98 - 111 mmol/L   CO2 24 22 - 32 mmol/L   Glucose, Bld 219 (H) 70 - 99 mg/dL   BUN 24 (H) 6 - 20 mg/dL   Creatinine, Ser 6.76 0.61 - 1.24 mg/dL   Calcium 6.9 (L) 8.9 - 10.3 mg/dL   GFR calc non Af Amer >60 >60 mL/min   GFR calc Af Amer >60 >60 mL/min   Anion gap 5 5 - 15  Troponin I - ONCE - STAT  Result Value Ref Range   Troponin I <0.03 <0.03 ng/mL   Dg Chest 2 View  Result Date: 11/10/2018 CLINICAL DATA:  Lower extremity swelling.  Recent heart failure. EXAM: CHEST - 2 VIEW COMPARISON:  Chest x-ray dated 11/07/2018 and 11/05/2018 FINDINGS: Chronic cardiomegaly. Tiny bilateral pleural effusions. Small amount of fluid along the fissures. No visible pulmonary edema. Slight pulmonary vascular congestion. Aortic atherosclerosis. IMPRESSION: Chronic cardiomegaly. Slight pulmonary vascular congestion, slightly more prominent than on the prior exam. Minimal bilateral pleural effusions. Aortic Atherosclerosis (ICD10-I70.0). Electronically Signed   By: Francene Boyers M.D.   On: 11/10/2018 17:50   Dg  Chest 2 View  Result Date: 11/05/2018 CLINICAL DATA:  Shortness of breath EXAM: CHEST - 2 VIEW COMPARISON:  11/04/2018, 10/26/2018, 10/17/2018 FINDINGS: Cardiomegaly with vascular congestion and mild diffuse interstitial process, likely edema. Small bilateral pleural effusions. No pneumothorax. Linear atelectasis or scar in the left mid lung.  IMPRESSION: Cardiomegaly with vascular congestion, mild interstitial edema and small bilateral pleural effusions. Electronically Signed   By: [name]   On: 11/05/2018 17:57   Dg Abdomen 1 View  Result Date: 11/21/2018 CLINICAL DATA:  58 year old male with history of abdominal pain and swelling. Constipation. EXAM: ABDOMEN - 1 VIEW COMPARISON:  Abdominal radiograph 07/18/2018. FINDINGS: Gas and stool are noted throughout the colon. Relatively large volume of stool in the region of the cecum and ascending colon. Paucity of distal rectal gas and stool. No pathologic dilatation of small bowel. No gross pneumoperitoneum noted on today's supine images. IMPRESSION: 1. Nonspecific nonobstructive bowel gas pattern, as above. Large volume of stool in the cecum and proximal ascending colon compatible with the reported clinical history of constipation. Electronically Signed   By: Trudie Reed M.D.   On: 11/21/2018 05:52   Dg Chest Port 1 View  Result Date: 11/26/2018 CLINICAL DATA:  Shortness of breath EXAM: PORTABLE CHEST 1 VIEW COMPARISON:  11/17/2018 FINDINGS: Cardiomegaly with vascular congestion. Diffuse interstitial prominence likely reflects interstitial edema. Lingular subsegmental atelectasis. No effusions or acute bony abnormality. IMPRESSION: Cardiomegaly, probable mild interstitial edema. Lingular atelectasis. Electronically Signed   By: Charlett Nose M.D.   On: 11/26/2018 10:02   Dg Chest Port 1 View  Result Date: 11/17/2018 CLINICAL DATA:  Shortness of breath EXAM: PORTABLE CHEST 1 VIEW COMPARISON:  Two days ago FINDINGS: Chronic cardiomegaly. Chronic generalized interstitial coarsening. No Kerley lines, effusion, or pneumothorax. Chronic metallic foreign body over the right base. IMPRESSION: Chronic cardiomegaly and interstitial coarsening. No significant change compared to multiple recent priors. Electronically Signed   By: [name]   On: 11/17/2018 07:05   Dg Chest  Portable 1 View  Result Date: 11/15/2018 CLINICAL DATA:  Fluid in stomach and legs. EXAM: PORTABLE CHEST 1 VIEW COMPARISON:  Nov 13, 2018 FINDINGS: Stable cardiomegaly. Increased interstitial markings in the lungs consistent with pulmonary venous congestion. Atelectasis in the left mid lung. No other acute abnormalities. IMPRESSION: Cardiomegaly and pulmonary venous congestion. Electronically Signed   By: Gerome Sam III M.D   On: 11/15/2018 09:11   Dg Chest Port 1 View  Result Date: 11/13/2018 CLINICAL DATA:  Shortness of breath and edema EXAM: PORTABLE CHEST 1 VIEW COMPARISON:  11/11/2018 FINDINGS: Cardiac shadow is enlarged in size but stable. Aortic calcifications are again seen. Scarring is noted in the left mid lung. The previously seen vascular congestion is improved when compared with the prior study. No focal infiltrate is seen. No sizable effusion is noted. IMPRESSION: Improvement of vascular congestion when compared with the prior study. Electronically Signed   By: Alcide Clever M.D.   On: 11/13/2018 10:47   Dg Chest Portable 1 View  Result Date: 11/11/2018 CLINICAL DATA:  58 year old male with shortness of breath. Abdominal pain, lower extremity swelling. EXAM: PORTABLE CHEST 1 VIEW COMPARISON:  11/10/2018 and earlier. FINDINGS: Portable AP upright view at 0827 hours. Stable cardiomegaly and mediastinal contours. Visualized tracheal air column is within normal limits. Stable lung volumes. Mild linear atelectasis is stable in the mid left lung. Increased interstitial opacity with indistinct vasculature. Persistent small right pleural effusion. No pneumothorax or consolidation. Stable small retained  metallic foreign body projecting at the anterior right hemidiaphragm. IMPRESSION: Acute pulmonary interstitial edema suspected. Stable cardiomegaly and small right pleural effusion. Electronically Signed   By: Odessa Fleming M.D.   On: 11/11/2018 08:36   Dg Chest Portable 1 View  Result Date:  11/07/2018 CLINICAL DATA:  Pt came to the ED with complaints of bilateral legs swelling, testicle pain and weakness PT HX: current smoker, DM, HTNleft swelling, shortness of breath; h/o of CHF EXAM: PORTABLE CHEST 1 VIEW COMPARISON:  11/06/2018 FINDINGS: Stable enlarged cardiac silhouette. No effusion, infiltrate pneumothorax. Improvement in central venous congestion pattern seen on comparison exam. IMPRESSION: Stable cardiomegaly. Improvement in central venous pulmonary congestion Electronically Signed   By: Genevive Bi M.D.   On: 11/07/2018 05:12   Dg Chest Port 1 View  Result Date: 11/06/2018 CLINICAL DATA:  Shortness of breath, cough EXAM: PORTABLE CHEST 1 VIEW COMPARISON:  11/05/2018 FINDINGS: Cardiomegaly. Bilateral perihilar and lower lobe opacities, with interstitial prominence. Findings likely reflect edema/CHF. No visible significant effusions or acute bony abnormality. IMPRESSION: Mild to moderate CHF. Electronically Signed   By: Charlett Nose M.D.   On: 11/06/2018 01:01   Dg Chest Portable 1 View  Result Date: 11/04/2018 CLINICAL DATA:  Bilateral leg swelling, shortness of breath EXAM: PORTABLE CHEST 1 VIEW COMPARISON:  10/26/2018 FINDINGS: Cardiomegaly with vascular congestion. Interstitial prominence likely reflects early interstitial edema. Right base atelectasis. No significant effusions or acute bony abnormality. IMPRESSION: Cardiomegaly with vascular congestion and probable early interstitial edema. Electronically Signed   By: Charlett Nose M.D.   On: 11/04/2018 03:09    EKG  EKG Interpretation  Date/Time:  Monday November 30 2018 05:52:55 EDT Ventricular Rate:  90 PR Interval:    QRS Duration: 89 QT Interval:  348 QTC Calculation: 426 R Axis:   25 Text Interpretation:  Sinus rhythm Multiple premature complexes, vent & supraven Confirmed by Reynolds Kittel ([id number]) on 11/30/2018 6:26:41 AM       Radiology No results found.  Procedures Procedures (including critical care  time)  Medications Ordered in ED Medications  potassium chloride SA (K-DUR) CR tablet 120 mEq (has no administration in time range)  albuterol (VENTOLIN HFA) 108 (90 Base) MCG/ACT inhaler 8 puff (has no administration in time range)  albuterol (VENTOLIN HFA) 108 (90 Base) MCG/ACT inhaler 10 puff (10 puffs Inhalation Given 11/30/18 0553)  ipratropium (ATROVENT HFA) inhaler 4 puff (4 puffs Inhalation Given 11/30/18 0553)  AeroChamber Plus Flo-Vu Large MISC 1 each (1 each Other Given 11/30/18 0554)   Markedly improved post medication.    Antibiotics inhalers steroids, potassium supplements x 5 days.  Stop using crack cocaine.   Final Clinical Impressions(s) / ED Diagnoses    Return for intractable cough, coughing up blood,fevers >100.4 unrelieved by medication, shortness of breath, intractable vomiting, chest pain, shortness of breath, weakness,numbness, changes in speech, facial asymmetry,abdominal pain, passing out,Inability to tolerate liquids or food, cough, altered mental status or any concerns. No signs of systemic illness or infection. The patient is nontoxic-appearing on exam and vital signs are within normal limits.   I have reviewed the triage vital signs and the nursing notes. Pertinent labs &imaging results that were available during my care of the patient were reviewed by me and considered in my medical decision making (see chart for details).  After history, exam, and medical workup I feel the patient has been appropriately medically screened and is safe for discharge home. Pertinent diagnoses were discussed with the patient. Patient was given return  precautions   Mykel Sponaugle, MD 11/30/18 [phone]

## 2018-11-30 NOTE — ED Triage Notes (Signed)
Arrives via EMS from a motel. C/C SOB and used crack x3 hours ago. Oxygen saturation 80s RA, given solumedrol, mag and epi. 4 L Hines 90s as he arrives.

## 2018-12-01 ENCOUNTER — Encounter (HOSPITAL_COMMUNITY): Payer: Self-pay | Admitting: Emergency Medicine

## 2018-12-01 ENCOUNTER — Emergency Department (HOSPITAL_COMMUNITY)
Admission: EM | Admit: 2018-12-01 | Discharge: 2018-12-01 | Disposition: A | Discharge status: Home / Self Care | Payer: Medicare Other | Source: Home / Self Care | Attending: Emergency Medicine | Admitting: Emergency Medicine

## 2018-12-01 DIAGNOSIS — R600 Localized edema: Secondary | ICD-10-CM | POA: Insufficient documentation

## 2018-12-01 DIAGNOSIS — R601 Generalized edema: Secondary | ICD-10-CM | POA: Diagnosis not present

## 2018-12-01 DIAGNOSIS — I5042 Chronic combined systolic (congestive) and diastolic (congestive) heart failure: Secondary | ICD-10-CM | POA: Insufficient documentation

## 2018-12-01 DIAGNOSIS — R1084 Generalized abdominal pain: Secondary | ICD-10-CM | POA: Insufficient documentation

## 2018-12-01 DIAGNOSIS — R609 Edema, unspecified: Secondary | ICD-10-CM

## 2018-12-01 DIAGNOSIS — E119 Type 2 diabetes mellitus without complications: Secondary | ICD-10-CM | POA: Insufficient documentation

## 2018-12-01 DIAGNOSIS — I110 Hypertensive heart disease with heart failure: Secondary | ICD-10-CM | POA: Insufficient documentation

## 2018-12-01 NOTE — Discharge Instructions (Signed)
Take your medications as prescribed.

## 2018-12-01 NOTE — ED Notes (Signed)
Pt awake now yelling at staff , sats 100 % on room air

## 2018-12-01 NOTE — ED Provider Notes (Signed)
[facility] Provider Note  CSN: [id number] Arrival date & time: 12/01/18 0344  Chief Complaint(s) No chief complaint on file.  HPI [name] is a 58 y.o. male with a past medical history listed below including diabetes, combined systolic/diastolic heart failure with anasarca on Lasix, history of noncompliance with medication and malingering.  He presents to the emergency department with abdominal pain.  Patient states that his abdomen is so large that he has difficulty bending up whenever he sits down.  Patient will not give any specific details regarding his abdominal pain just states that it "hurts all over.  When attempting to get more details from the patient regarding his symptoms and medication, the patient became verbally aggressive.  Patient demanded that I get my "punk ass face" to his face and that I should "stop asking him all these damn questions."  HPI  Past Medical History Past Medical History:  Diagnosis Date  . Chronic foot pain   . Cocaine abuse (HCC)   . Diabetes mellitus without complication (HCC)   . Hepatitis C    unsure   . Homelessness   . Hypertension   . Neuropathy   . Polysubstance abuse (HCC)   . Schizophrenia (HCC)   . Sleep apnea   . Systolic and diastolic CHF, chronic [facility](HCC)    Patient Active Problem List   Diagnosis Date Noted  . MDD (major depressive disorder), severe (HCC) 11/25/2018  . Pressure injury of skin 11/08/2018  . Elevated troponin 10/18/2018  . HLD (hyperlipidemia) 10/18/2018  . Anxiety 10/18/2018  . CHF (congestive heart failure), NYHA class II, acute on chronic, combined (HCC) 10/18/2018  . Hypertensive urgency 10/12/2018  . Mild renal insufficiency 10/12/2018  . Cellulitis 10/12/2018  . Penile cellulitis   . Adjustment disorder with mixed disturbance of emotions and conduct   . Sleep apnea 10/03/2018  . Hypokalemia 09/17/2018  . Scrotal swelling 09/17/2018  . Urinary hesitancy    . Constipation   . Acute on chronic combined systolic and diastolic CHF (congestive heart failure) (HCC) 08/25/2018  . Acute respiratory failure with hypoxia (HCC) 08/25/2018  . Tobacco use 08/18/2018  . Homelessness 08/08/2018  . Smoker 08/08/2018  . Prostate enlargement 03/16/2018  . Aortic atherosclerosis (HCC) 03/16/2018  . 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 Chronic foot pain   . Schizoaffective disorder, bipolar type (HCC) 09/30/2016  . Substance induced mood disorder (HCC) 03/13/2015  . Acute kidney failure (HCC) 01/26/2015  . Schizophrenia, paranoid type (HCC) 01/17/2015  . Drug hallucinosis (HCC) 10/08/2014  . Chronic paranoid schizophrenia (HCC) 09/07/2014  . 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 Urinary retention   . Cocaine use disorder, severe, dependence (HCC)   . Essential hypertension, benign 03/28/2013  . Insulin-requiring or dependent type II diabetes mellitus (HCC) 03/15/2013   Home Medication(s) Prior to Admission medications   Medication Sig Start Date End Date Taking? Authorizing Provider  acetaminophen (TYLENOL) 500 MG tablet Take 1,000 mg by mouth every 6 (six) hours as needed for moderate pain.    [provider]  ARIPiprazole (ABILIFY) 10 MG tablet Take 1 tablet (10 mg total) by mouth daily. Patient not taking: Reported on 11/13/2018 09/18/18   Meredeth IdeLama, Gagan S, MD  aspirin EC 81 MG tablet Take 1 tablet (81 mg total) by mouth daily. Patient not taking: Reported on 11/13/2018 10/19/18 02/16/19  Meredeth IdeLama, Gagan S, MD  atorvastatin (LIPITOR) 40 MG tablet Take  1 tablet (40 mg total) by mouth daily. Patient not taking: Reported on 11/13/2018 09/18/18   Meredeth Ide, MD  carbamazepine (TEGRETOL) 200 MG tablet Take 0.5 tablets (100 mg total) by mouth 2 (two) times daily. 11/26/18   Mancel Bale, MD  carvedilol (COREG) 6.25 MG tablet Take 1 tablet (6.25 mg total) by mouth 2 (two)  times daily with a meal. 11/26/18   Mancel Bale, MD  doxycycline (VIBRAMYCIN) 100 MG capsule Take 1 capsule (100 mg total) by mouth 2 (two) times daily. One po bid x 7 days 11/30/18   Palumbo, April, MD  furosemide (LASIX) 40 MG tablet Take 1 tablet (40 mg total) by mouth 2 (two) times daily for 30 days. 11/26/18 12/26/18  Mancel Bale, MD  hydrocortisone (PROCTO-PAK) 1 % CREA Apply twice a day to hemorrhoids. Patient not taking: Reported on 11/13/2018 09/29/18   Renne Crigler, PA-C  Insulin Glargine (LANTUS) 100 UNIT/ML Solostar Pen Inject 14 Units into the skin daily. Patient not taking: Reported on 11/13/2018 09/18/18   Meredeth Ide, MD  Ipratropium-Albuterol (COMBIVENT) 20-100 MCG/ACT AERS respimat Inhale 1 puff into the lungs every 6 (six) hours. 11/30/18   Palumbo, April, MD  lisinopril (ZESTRIL) 10 MG tablet Take 1 tablet (10 mg total) by mouth daily. 11/26/18   Mancel Bale, MD  polyethylene glycol [facility] Ethelene Hal) packet Take 17 g by mouth 2 (two) times daily. Patient not taking: Reported on 11/13/2018 09/18/18   Meredeth Ide, MD  Potassium Chloride ER 20 MEQ TBCR Take 20 mEq by mouth daily. Take one pill twice a day for five days, then take one pill every day 11/21/18   Dione Booze, MD  potassium chloride SA (K-DUR) 20 MEQ tablet Take 1 tablet (20 mEq total) by mouth 2 (two) times daily. 11/30/18   Palumbo, April, MD  predniSONE (DELTASONE) 20 MG tablet 3 tabs po day one, then 2 po daily x 4 days 11/30/18   Palumbo, April, MD  tamsulosin (FLOMAX) 0.4 MG CAPS capsule Take 1 capsule (0.4 mg total) by mouth daily after breakfast. Patient not taking: Reported on 11/13/2018 10/05/18   Narda Bonds, MD                                                                                                                                    Past Surgical History Past Surgical History:  Procedure Laterality Date  . MULTIPLE TOOTH EXTRACTIONS     Family History Family History  Problem Relation Age of  Onset  . Hypertension Other   . Diabetes Other     Social History Social History   Tobacco Use  . Smoking status: Current Every Day Smoker    Packs/day: 1.00    Years: 20.00    Pack years: 20.00    Types: Cigarettes  . Smokeless tobacco: Current User  Substance Use Topics  . Alcohol use: Yes    Comment:  Daily Drinker   . Drug use: Yes    Frequency: 7.0 times per week    Types: "Crack" cocaine, Cocaine, Marijuana    Comment: used cocaine 11-27-18   Allergies Haldol [haloperidol]  Review of Systems Review of Systems  Unable to perform ROS: Other   Patient is uncooperative.  Physical Exam Vital Signs  I have reviewed the triage vital signs BP (!) 166/104   Pulse 95   Temp 97.6 F (36.4 C) (Oral)   Resp 20   SpO2 93%   Physical Exam Vitals signs reviewed.  Constitutional:      General: He is not in acute distress.    Appearance: He is well-developed. He is not diaphoretic.  HENT:     Head: Normocephalic and atraumatic.     Jaw: No trismus.     Right Ear: External ear normal.     Left Ear: External ear normal.     Nose: Nose normal.  Eyes:     General: No scleral icterus.    Conjunctiva/sclera: Conjunctivae normal.  Neck:     Musculoskeletal: Normal range of motion.     Trachea: Phonation normal.  Cardiovascular:     Rate and Rhythm: Normal rate and regular rhythm.     Comments: Edema up to scrotum and abdominal wall Pulmonary:     Effort: Pulmonary effort is normal. No respiratory distress.     Breath sounds: No stridor.  Abdominal:     General: There is no distension.     Tenderness: There is no abdominal tenderness.  Musculoskeletal: Normal range of motion.     Right lower leg: 2+ Pitting Edema present.     Left lower leg: 2+ Pitting Edema present.  Neurological:     Mental Status: He is alert and oriented to person, place, and time.  Psychiatric:        Behavior: Behavior normal.     ED Results and Treatments Labs (all labs ordered are  listed, but only abnormal results are displayed) Labs Reviewed - No data to display                                                                                                                       EKG  EKG Interpretation  Date/Time:    Ventricular Rate:    PR Interval:    QRS Duration:   QT Interval:    QTC Calculation:   R Axis:     Text Interpretation:        Radiology Dg Chest Portable 1 View  Result Date: 11/30/2018 CLINICAL DATA:  58 year old male with shortness of breath and wheezing. Recent cocaine use. EXAM: PORTABLE CHEST 1 VIEW COMPARISON:  11/26/2018 and earlier. FINDINGS: Portable AP semi upright view at 0532 hours. Stable cardiomegaly and mediastinal contours. Stable lung volumes and lung markings. Chronic linear atelectasis or scarring in the left mid lung. No acute pulmonary opacity. No pneumothorax or pleural effusion identified. Paucity of bowel gas in the upper abdomen. No  acute osseous abnormality identified. IMPRESSION: Stable cardiomegaly. No acute cardiopulmonary abnormality. Electronically Signed   By: Odessa Fleming M.D.   On: 11/30/2018 06:27   Pertinent labs & imaging results that were available during my care of the patient were reviewed by me and considered in my medical decision making (see chart for details).  Medications Ordered in ED Medications - No data to display                                                                                                                                  Procedures Procedures  (including critical care time)  Medical Decision Making / ED Course I have reviewed the nursing notes for this encounter and the patient's prior records (if available in EHR or on provided paperwork).    Patient presents with abdominal pain.  He cannot provide any specific details regarding this but appears to be related to his edema.  This is chronic for the patient and due to noncompliance with his medication.  He does not appear in any  acute distress at this time.  During my evaluation patient did not have any tenderness to palpation when distracted.  Given the patient's behavior when attempting to gain additional information regarding his presentation, I feel that the patient is here for secondary gain.   The patient appears reasonably screened and/or stabilized for discharge and I doubt any other medical condition or other [facility] requiring further screening, evaluation, or treatment in the ED at this time prior to discharge.  The patient is safe for discharge with strict return precautions.   Final Clinical Impression(s) / ED Diagnoses Final diagnoses:  None    Disposition: Discharge  Condition: Good  I have discussed the results, Dx and Tx plan with the patient who expressed understanding and agree(s) with the plan. Discharge instructions discussed at great length. The patient was given strict return precautions who verbalized understanding of the instructions. No further questions at time of discharge.    ED Discharge Orders    None       Follow Up: Primary care provider  Schedule an appointment as soon as possible for a visit       This chart was dictated using voice recognition software.  Despite best efforts to proofread,  errors can occur which can change the documentation meaning.   Nira Conn, MD 12/01/18 [phone]

## 2018-12-01 NOTE — ED Notes (Signed)
Pt placed on 3 liters N/c due to sats of 85 % when asleep

## 2018-12-01 NOTE — ED Triage Notes (Signed)
Pt here this morning with c/o hip pain

## 2018-12-02 ENCOUNTER — Encounter (HOSPITAL_COMMUNITY): Payer: Self-pay | Admitting: Emergency Medicine

## 2018-12-02 ENCOUNTER — Other Ambulatory Visit (HOSPITAL_COMMUNITY): Payer: Medicaid Other

## 2018-12-02 ENCOUNTER — Other Ambulatory Visit: Payer: Self-pay

## 2018-12-02 ENCOUNTER — Inpatient Hospital Stay (HOSPITAL_COMMUNITY)
Admission: EM | Admit: 2018-12-02 | Discharge: 2018-12-03 | DRG: 291 | Discharge status: Against Medical Advice | Payer: Medicare Other | Attending: Internal Medicine | Admitting: Internal Medicine

## 2018-12-02 ENCOUNTER — Emergency Department (HOSPITAL_COMMUNITY): Payer: Medicare Other

## 2018-12-02 DIAGNOSIS — F1420 Cocaine dependence, uncomplicated: Secondary | ICD-10-CM | POA: Diagnosis present

## 2018-12-02 DIAGNOSIS — Z888 Allergy status to other drugs, medicaments and biological substances status: Secondary | ICD-10-CM

## 2018-12-02 DIAGNOSIS — I5082 Biventricular heart failure: Secondary | ICD-10-CM

## 2018-12-02 DIAGNOSIS — Z9119 Patient's noncompliance with other medical treatment and regimen: Secondary | ICD-10-CM | POA: Diagnosis not present

## 2018-12-02 DIAGNOSIS — N485 Ulcer of penis: Secondary | ICD-10-CM

## 2018-12-02 DIAGNOSIS — I700 Atherosclerosis of aorta: Secondary | ICD-10-CM | POA: Diagnosis present

## 2018-12-02 DIAGNOSIS — Z8249 Family history of ischemic heart disease and other diseases of the circulatory system: Secondary | ICD-10-CM

## 2018-12-02 DIAGNOSIS — F200 Paranoid schizophrenia: Secondary | ICD-10-CM | POA: Diagnosis present

## 2018-12-02 DIAGNOSIS — Z79899 Other long term (current) drug therapy: Secondary | ICD-10-CM

## 2018-12-02 DIAGNOSIS — G8929 Other chronic pain: Secondary | ICD-10-CM | POA: Diagnosis present

## 2018-12-02 DIAGNOSIS — Z5329 Procedure and treatment not carried out because of patient's decision for other reasons: Secondary | ICD-10-CM | POA: Diagnosis present

## 2018-12-02 DIAGNOSIS — R739 Hyperglycemia, unspecified: Secondary | ICD-10-CM

## 2018-12-02 DIAGNOSIS — E1140 Type 2 diabetes mellitus with diabetic neuropathy, unspecified: Secondary | ICD-10-CM | POA: Diagnosis present

## 2018-12-02 DIAGNOSIS — J9601 Acute respiratory failure with hypoxia: Secondary | ICD-10-CM | POA: Diagnosis present

## 2018-12-02 DIAGNOSIS — I714 Abdominal aortic aneurysm, without rupture: Secondary | ICD-10-CM | POA: Diagnosis present

## 2018-12-02 DIAGNOSIS — I509 Heart failure, unspecified: Secondary | ICD-10-CM | POA: Insufficient documentation

## 2018-12-02 DIAGNOSIS — E1165 Type 2 diabetes mellitus with hyperglycemia: Secondary | ICD-10-CM | POA: Diagnosis present

## 2018-12-02 DIAGNOSIS — I110 Hypertensive heart disease with heart failure: Secondary | ICD-10-CM | POA: Diagnosis present

## 2018-12-02 DIAGNOSIS — Z794 Long term (current) use of insulin: Secondary | ICD-10-CM | POA: Diagnosis not present

## 2018-12-02 DIAGNOSIS — Z5900 Homelessness unspecified: Secondary | ICD-10-CM

## 2018-12-02 DIAGNOSIS — Z20828 Contact with and (suspected) exposure to other viral communicable diseases: Secondary | ICD-10-CM | POA: Diagnosis present

## 2018-12-02 DIAGNOSIS — Z9114 Patient's other noncompliance with medication regimen: Secondary | ICD-10-CM | POA: Diagnosis not present

## 2018-12-02 DIAGNOSIS — F17210 Nicotine dependence, cigarettes, uncomplicated: Secondary | ICD-10-CM | POA: Diagnosis present

## 2018-12-02 DIAGNOSIS — E785 Hyperlipidemia, unspecified: Secondary | ICD-10-CM | POA: Diagnosis present

## 2018-12-02 DIAGNOSIS — R601 Generalized edema: Secondary | ICD-10-CM | POA: Diagnosis not present

## 2018-12-02 DIAGNOSIS — I5043 Acute on chronic combined systolic (congestive) and diastolic (congestive) heart failure: Secondary | ICD-10-CM | POA: Diagnosis present

## 2018-12-02 DIAGNOSIS — Z7982 Long term (current) use of aspirin: Secondary | ICD-10-CM

## 2018-12-02 DIAGNOSIS — G4730 Sleep apnea, unspecified: Secondary | ICD-10-CM | POA: Diagnosis present

## 2018-12-02 DIAGNOSIS — L98491 Non-pressure chronic ulcer of skin of other sites limited to breakdown of skin: Secondary | ICD-10-CM

## 2018-12-02 DIAGNOSIS — I10 Essential (primary) hypertension: Secondary | ICD-10-CM | POA: Diagnosis present

## 2018-12-02 LAB — CBC WITH DIFFERENTIAL/PLATELET
Abs Immature Granulocytes: 0.04 10*3/uL (ref 0.00–0.07)
Basophils Absolute: 0 10*3/uL (ref 0.0–0.1)
Basophils Relative: 0 %
Eosinophils Absolute: 0 10*3/uL (ref 0.0–0.5)
Eosinophils Relative: 0 %
HCT: 32.1 % — ABNORMAL LOW (ref 39.0–52.0)
Hemoglobin: 9.3 g/dL — ABNORMAL LOW (ref 13.0–17.0)
Immature Granulocytes: 1 %
Lymphocytes Relative: 9 %
Lymphs Abs: 0.8 10*3/uL (ref 0.7–4.0)
MCH: 23.8 pg — ABNORMAL LOW (ref 26.0–34.0)
MCHC: 29 g/dL — ABNORMAL LOW (ref 30.0–36.0)
MCV: 82.1 fL (ref 80.0–100.0)
Monocytes Absolute: 0.7 10*3/uL (ref 0.1–1.0)
Monocytes Relative: 8 %
Neutro Abs: 7.2 10*3/uL (ref 1.7–7.7)
Neutrophils Relative %: 82 %
Platelets: 313 10*3/uL (ref 150–400)
RBC: 3.91 MIL/uL — ABNORMAL LOW (ref 4.22–5.81)
RDW: 20.6 % — ABNORMAL HIGH (ref 11.5–15.5)
WBC: 8.7 10*3/uL (ref 4.0–10.5)
nRBC: 0.6 % — ABNORMAL HIGH (ref 0.0–0.2)

## 2018-12-02 LAB — BLOOD GAS, ARTERIAL
Acid-Base Excess: 1.7 mmol/L (ref 0.0–2.0)
Bicarbonate: 26.6 mmol/L (ref 20.0–28.0)
Drawn by: 235321
FIO2: 21
O2 Saturation: 59.7 %
Patient temperature: 98.6
pCO2 arterial: 45.7 mmHg (ref 32.0–48.0)
pH, Arterial: 7.382 (ref 7.350–7.450)
pO2, Arterial: 34.1 mmHg — CL (ref 83.0–108.0)

## 2018-12-02 LAB — GLUCOSE, CAPILLARY
Glucose-Capillary: 83 mg/dL (ref 70–99)
Glucose-Capillary: 183 mg/dL — ABNORMAL HIGH (ref 70–99)
Glucose-Capillary: 155 mg/dL — ABNORMAL HIGH (ref 70–99)

## 2018-12-02 LAB — CREATININE, SERUM
Creatinine, Ser: 0.87 mg/dL (ref 0.61–1.24)
GFR calc Af Amer: >60 mL/min (ref >60)
GFR calc non Af Amer: >60 mL/min (ref >60)

## 2018-12-02 LAB — BASIC METABOLIC PANEL
Anion gap: 6 (ref 5–15)
BUN: 24 mg/dL — ABNORMAL HIGH (ref 6–20)
CO2: 26 mmol/L (ref 22–32)
Calcium: 8.4 mg/dL — ABNORMAL LOW (ref 8.9–10.3)
Chloride: 105 mmol/L (ref 98–111)
Creatinine, Ser: 0.94 mg/dL (ref 0.61–1.24)
GFR calc Af Amer: >60 mL/min (ref >60)
GFR calc non Af Amer: >60 mL/min (ref >60)
Glucose, Bld: 260 mg/dL — ABNORMAL HIGH (ref 70–99)
Potassium: 4.1 mmol/L (ref 3.5–5.1)
Sodium: 137 mmol/L (ref 135–145)

## 2018-12-02 LAB — CBC
HCT: 34.8 % — ABNORMAL LOW (ref 39.0–52.0)
Hemoglobin: 9.6 g/dL — ABNORMAL LOW (ref 13.0–17.0)
MCH: 23.2 pg — ABNORMAL LOW (ref 26.0–34.0)
MCHC: 27.6 g/dL — ABNORMAL LOW (ref 30.0–36.0)
MCV: 84.1 fL (ref 80.0–100.0)
Platelets: 292 10*3/uL (ref 150–400)
RBC: 4.14 MIL/uL — ABNORMAL LOW (ref 4.22–5.81)
RDW: 20.6 % — ABNORMAL HIGH (ref 11.5–15.5)
WBC: 7.6 10*3/uL (ref 4.0–10.5)
nRBC: 0.7 % — ABNORMAL HIGH (ref 0.0–0.2)

## 2018-12-02 LAB — TROPONIN I
Troponin I: <0.03 ng/mL (ref <0.03)
Troponin I: <0.03 ng/mL (ref <0.03)
Troponin I: <0.03 ng/mL (ref <0.03)
Troponin I: 0.03 ng/mL (ref <0.03)

## 2018-12-02 LAB — RAPID URINE DRUG SCREEN, HOSP PERFORMED
Amphetamines: NOT DETECTED
Barbiturates: NOT DETECTED
Benzodiazepines: NOT DETECTED
Cocaine: POSITIVE — AB
Opiates: NOT DETECTED
Tetrahydrocannabinol: NOT DETECTED

## 2018-12-02 LAB — BRAIN NATRIURETIC PEPTIDE: B Natriuretic Peptide: 1047.7 pg/mL — ABNORMAL HIGH (ref 0.0–100.0)

## 2018-12-02 LAB — HEMOGLOBIN A1C
Hgb A1c MFr Bld: 9.4 % — ABNORMAL HIGH (ref 4.8–5.6)
Mean Plasma Glucose: 223.08 mg/dL

## 2018-12-02 LAB — SARS CORONAVIRUS 2 BY RT PCR (HOSPITAL ORDER, PERFORMED IN ~~LOC~~ HOSPITAL LAB): SARS Coronavirus 2: NEGATIVE

## 2018-12-02 MED ORDER — INSULIN ASPART 100 UNIT/ML ~~LOC~~ SOLN
0.0000 [IU] | Freq: Three times a day (TID) | SUBCUTANEOUS | Status: DC
  Administered 2018-12-02 (×2): 2 [IU] via SUBCUTANEOUS
  Administered 2018-12-03: 3 [IU] via SUBCUTANEOUS

## 2018-12-02 MED ORDER — POTASSIUM CHLORIDE CRYS ER 20 MEQ PO TBCR
20.0000 meq | EXTENDED_RELEASE_TABLET | Freq: Two times a day (BID) | ORAL | Status: DC
  Administered 2018-12-02 – 2018-12-03 (×3): 20 meq via ORAL
  Filled 2018-12-02 (×3): qty 1

## 2018-12-02 MED ORDER — ONDANSETRON HCL 4 MG/2ML IJ SOLN: 4.0000 mg | Freq: Four times a day (QID) | INTRAMUSCULAR | Status: DC | PRN

## 2018-12-02 MED ORDER — SODIUM CHLORIDE 0.9% FLUSH: 3.0000 mL | INTRAVENOUS | Status: DC | PRN

## 2018-12-02 MED ORDER — LISINOPRIL 5 MG PO TABS
5.0000 mg | ORAL_TABLET | Freq: Every day | ORAL | Status: DC
  Administered 2018-12-02 – 2018-12-03 (×2): 5 mg via ORAL
  Filled 2018-12-02 (×2): qty 1

## 2018-12-02 MED ORDER — SODIUM CHLORIDE 0.9 % IV BOLUS: 1000.0000 mL | Freq: Once | INTRAVENOUS | Status: DC

## 2018-12-02 MED ORDER — FUROSEMIDE 10 MG/ML IJ SOLN
40.0000 mg | Freq: Once | INTRAMUSCULAR | Status: AC
  Administered 2018-12-02: 40 mg via INTRAVENOUS
  Filled 2018-12-02: qty 4

## 2018-12-02 MED ORDER — FUROSEMIDE 10 MG/ML IJ SOLN
40.0000 mg | Freq: Two times a day (BID) | INTRAMUSCULAR | Status: DC
  Administered 2018-12-02: 40 mg via INTRAVENOUS
  Filled 2018-12-02: qty 4

## 2018-12-02 MED ORDER — INSULIN ASPART 100 UNIT/ML ~~LOC~~ SOLN
0.0000 [IU] | Freq: Every day | SUBCUTANEOUS | Status: DC
  Administered 2018-12-03: 3 [IU] via SUBCUTANEOUS

## 2018-12-02 MED ORDER — ACETAMINOPHEN 325 MG PO TABS
650.0000 mg | ORAL_TABLET | ORAL | Status: DC | PRN
  Administered 2018-12-02: 650 mg via ORAL
  Filled 2018-12-02 (×2): qty 2

## 2018-12-02 MED ORDER — CARBAMAZEPINE 200 MG PO TABS
100.0000 mg | ORAL_TABLET | Freq: Two times a day (BID) | ORAL | Status: DC
  Administered 2018-12-02 – 2018-12-03 (×3): 100 mg via ORAL
  Filled 2018-12-02 (×3): qty 0.5

## 2018-12-02 MED ORDER — ASPIRIN EC 81 MG PO TBEC
81.0000 mg | DELAYED_RELEASE_TABLET | Freq: Every day | ORAL | Status: DC
  Administered 2018-12-02 – 2018-12-03 (×2): 81 mg via ORAL
  Filled 2018-12-02 (×2): qty 1

## 2018-12-02 MED ORDER — NICOTINE 14 MG/24HR TD PT24
14.0000 mg | MEDICATED_PATCH | Freq: Every day | TRANSDERMAL | Status: DC
  Administered 2018-12-02 – 2018-12-03 (×2): 14 mg via TRANSDERMAL
  Filled 2018-12-02 (×2): qty 1

## 2018-12-02 MED ORDER — ACETAMINOPHEN 325 MG PO TABS
650.0000 mg | ORAL_TABLET | Freq: Once | ORAL | Status: AC
  Administered 2018-12-02: 09:00:00 650 mg via ORAL
  Filled 2018-12-02: qty 2

## 2018-12-02 MED ORDER — SODIUM CHLORIDE 0.9% FLUSH
3.0000 mL | Freq: Two times a day (BID) | INTRAVENOUS | Status: DC
  Administered 2018-12-02 – 2018-12-03 (×3): 3 mL via INTRAVENOUS

## 2018-12-02 MED ORDER — FUROSEMIDE 10 MG/ML IJ SOLN
40.0000 mg | Freq: Three times a day (TID) | INTRAMUSCULAR | Status: DC
  Administered 2018-12-02 – 2018-12-03 (×3): 40 mg via INTRAVENOUS
  Filled 2018-12-02 (×3): qty 4

## 2018-12-02 MED ORDER — SODIUM CHLORIDE 0.9 % IV SOLN: 250.0000 mL | INTRAVENOUS | Status: DC | PRN

## 2018-12-02 MED ORDER — INSULIN GLARGINE 100 UNIT/ML ~~LOC~~ SOLN
14.0000 [IU] | Freq: Every day | SUBCUTANEOUS | Status: DC
  Administered 2018-12-03: 14 [IU] via SUBCUTANEOUS
  Filled 2018-12-02: qty 0.14

## 2018-12-02 MED ORDER — INSULIN ASPART 100 UNIT/ML ~~LOC~~ SOLN
10.0000 [IU] | Freq: Once | SUBCUTANEOUS | Status: AC
  Administered 2018-12-02: 05:00:00 10 [IU] via SUBCUTANEOUS
  Filled 2018-12-02: qty 1

## 2018-12-02 MED ORDER — ENOXAPARIN SODIUM 40 MG/0.4ML ~~LOC~~ SOLN
40.0000 mg | SUBCUTANEOUS | Status: DC
  Administered 2018-12-02 – 2018-12-03 (×2): 40 mg via SUBCUTANEOUS
  Filled 2018-12-02 (×2): qty 0.4

## 2018-12-02 NOTE — H&P (Signed)
History and Physical        Hospital Admission Note Date: 12/02/2018  Patient name: [name] Medical record number: [id number] Date of birth: Aug 08, 1960 Age: 58 y.o. Gender: male  PCP: Patient, No Pcp Per    Patient coming from: Homeless  I have reviewed all records in the [facility].    Chief Complaint:  Reporting shortness of breath, can barely walk, abdominal bloating  HPI: Patient is a 58 year old male with known history of systolic and diastolic CHF with EF 25 to 30%, cocaine abuse, homeless, hypertension, noncompliant, schizophrenia, diabetes presented to ED with  dyspnea on exertion and multiple complaints.  Patient is a poor historian, states he is homeless for years.  Patient was recently admitted on 5/19-5/22 with same complaints, has recurrent hospitalization for CHF exacerbation. Patient reported that he can barely walk due to shortness of breath, his abdomen feels tight and bloated, legs are swollen.  He also reports shortness of breath and chest pain.  He admitted to using cocaine last night.  He was brought by EMS, initially found to be in hypoxic respiratory failure and was placed on nonrebreather.  At the time of my examination on 4 L O2 via nasal cannula. COVID-19 test negative  ED work-up/course:  In ED, temp 99.1, pulse 101, BP 166/97, O2 sats 95% on 6 L initially now weaned down to 99% on 3 L BNP 1477, troponin less than 0.03, creatinine 0.9, hemoglobin 9.6  Review of Systems: Positives marked in 'bold' Constitutional: Denies fever, chills, diaphoresis,+ poor appetite and fatigue.  HEENT: Denies photophobia, eye pain, redness, hearing loss, ear pain, congestion, sore throat, rhinorrhea, sneezing, mouth sores, trouble swallowing, neck pain, neck stiffness and tinnitus.   Respiratory: Please see HPI Cardiovascular: Please see HPI  Gastrointestinal: Denies nausea, vomiting, abdominal pain, diarrhea, constipation, blood in stool and + abdominal distention.  Genitourinary: Denies dysuria, urgency, frequency, hematuria, flank pain and difficulty urinating.  Musculoskeletal: Denies myalgias, back pain, joint swelling, arthralgias and gait problem.  Skin: Denies pallor, rash and wound.  Neurological: Denies dizziness, seizures, syncope, weakness, light-headedness, numbness and headaches.  Hematological: Denies adenopathy. Easy bruising, personal or family bleeding history  Psychiatric/Behavioral: Denies suicidal ideation, mood changes, confusion, nervousness, sleep disturbance and agitation  Past Medical History: Past Medical History:  Diagnosis Date  . Chronic foot pain   . Cocaine abuse (HCC)   . Diabetes mellitus without complication (HCC)   . Hepatitis C    unsure   . Homelessness   . Hypertension   . Neuropathy   . Polysubstance abuse (HCC)   . Schizophrenia (HCC)   . Sleep apnea   . Systolic and diastolic CHF, chronic (HCC)     Past Surgical History:  Procedure Laterality Date  . MULTIPLE TOOTH EXTRACTIONS      Medications: Prior to Admission medications   Medication Sig Start Date End Date Taking? Authorizing Provider  acetaminophen (TYLENOL) 500 MG tablet Take 1,000 mg by mouth every 6 (six) hours as needed for moderate pain.   Yes [provider]  carbamazepine (TEGRETOL) 200 MG tablet Take 0.5 tablets (100 mg total) by mouth 2 (two) times daily. 11/26/18  Mancel Bale, MD  carvedilol (COREG) 6.25 MG tablet Take 1 tablet (6.25 mg total) by mouth 2 (two) times daily with a meal. 11/26/18   Mancel Bale, MD  doxycycline (VIBRAMYCIN) 100 MG capsule Take 1 capsule (100 mg total) by mouth 2 (two) times daily. One po bid x 7 days 11/30/18   Palumbo, April, MD  furosemide (LASIX) 40 MG tablet Take 1 tablet (40 mg total) by mouth 2 (two) times daily for 30 days. 11/26/18 12/26/18  Mancel Bale, MD   Ipratropium-Albuterol (COMBIVENT) 20-100 MCG/ACT AERS respimat Inhale 1 puff into the lungs every 6 (six) hours. 11/30/18   Palumbo, April, MD  lisinopril (ZESTRIL) 10 MG tablet Take 1 tablet (10 mg total) by mouth daily. 11/26/18   Mancel Bale, MD  Potassium Chloride ER 20 MEQ TBCR Take 20 mEq by mouth daily. Take one pill twice a day for five days, then take one pill every day 11/21/18   Dione Booze, MD  potassium chloride SA (K-DUR) 20 MEQ tablet Take 1 tablet (20 mEq total) by mouth 2 (two) times daily. 11/30/18   Palumbo, April, MD  predniSONE (DELTASONE) 20 MG tablet 3 tabs po day one, then 2 po daily x 4 days 11/30/18   Nicanor Alcon, April, MD    Allergies:   Allergies  Allergen Reactions  . Haldol [Haloperidol] Other (See Comments)    Muscle spasms, loss of voluntary movement. However, pt has taken Thorazine on multiple occasions with no adverse effects.     Social History:  reports that he has been smoking cigarettes. He has a 20.00 pack-year smoking history. He uses smokeless tobacco. He reports current alcohol use. He reports current drug use. Frequency: 7.00 times per week. Drugs: "Crack" cocaine, Cocaine, and Marijuana.  Family History: Family History  Problem Relation Age of Onset  . Hypertension Other   . Diabetes Other     Physical Exam: Blood pressure (!) 151/94, pulse 94, temperature 97.9 F (36.6 C), temperature source Oral, resp. rate 20, height 5\' 9"  (1.753 m), weight 112 kg, SpO2 99 %. General: Alert, awake, oriented x3, in no acute distress.  Disheveled Eyes: pink conjunctiva,anicteric sclera, pupils equal and reactive to light and accomodation, HEENT: normocephalic, atraumatic, oropharynx clear Neck: supple, no masses or lymphadenopathy, no goiter, no bruits, no JVD CVS: Regular rate and rhythm, without murmurs, rubs or gallops. No lower extremity edema Resp : Decreased breath sound at the bases GI : Soft, nontender, distended with abdominal wall edema, positive  bowel sounds Musculoskeletal: 1+ pitting edema, no clubbing or cyanosis Neuro: Grossly intact, no focal neurological deficits, strength 5/5 upper and lower extremities bilaterally Psych: alert and oriented x 3, normal mood and affect Skin: no rashes or lesions, warm and dry   LABS on Admission: I have personally reviewed all the labs and imagings below    Basic Metabolic Panel: Recent Labs  Lab 11/26/18 1047  11/30/18 0521 12/02/18 0445  NA  --    < > 141 137  K  --    < > 2.7* 4.1  CL  --    < > 112* 105  CO2  --    < > 24 26  GLUCOSE  --    < > 219* 260*  BUN  --    < > 24* 24*  CREATININE  --    < > 0.88 0.94  CALCIUM  --    < > 6.9* 8.4*  MG 2.2  --   --   --    < > =  values in this interval not displayed.   Liver Function Tests: Recent Labs  Lab 11/28/18 1930  AST 49*  ALT 23  ALKPHOS 146*  BILITOT 0.9  PROT 6.5  ALBUMIN 2.6*   No results for input(s): LIPASE, AMYLASE in the last 168 hours. No results for input(s): AMMONIA in the last 168 hours. CBC: Recent Labs  Lab 12/02/18 0445 12/02/18 0907  WBC 8.7 7.6  NEUTROABS 7.2  --   HGB 9.3* 9.6*  HCT 32.1* 34.8*  MCV 82.1 84.1  PLT 313 292   Cardiac Enzymes: Recent Labs  Lab 11/30/18 0521 12/02/18 0458  TROPONINI <0.03 <0.03   BNP: Invalid input(s): POCBNP CBG: Recent Labs  Lab 11/26/18 1156 12/02/18 0834  GLUCAP 123* 83    Radiological Exams on Admission:  Dg Chest 2 View  Result Date: 12/02/2018 CLINICAL DATA:  Shortness of breath EXAM: CHEST - 2 VIEW COMPARISON:  Two days ago FINDINGS: Chronic cardiomegaly and vascular pedicle widening with cephalized blood flow and interstitial coarsening. There are small pleural effusions. IMPRESSION: CHF pattern. Electronically Signed   By: [name]   On: 12/02/2018 04:22      EKG: Independently reviewed.  Rate 96, sinus tachycardia, no acute ischemic changes   Assessment/Plan Principal Problem:   Acute on chronic combined systolic  and diastolic CHF (congestive heart failure) (HCC), acute respiratory failure with hypoxia  -Known history of combined systolic and diastolic CHF with EF 25 to 30%, noncompliant, polysubstance abuse with cocaine dependence, cocaine positive on admission presenting with volume overload, elevated BNP -Admit to telemetry, placed on IV Lasix 40 mg every 8 hours, no beta-blocker due to recurrent use of cocaine, continue lisinopril, aspirin -Continue serial troponins, recent echo on 11/07/2018 showed EF of 25 to 30% with left ventricle diffuse hypokinesis, right ventricular severely reduced systolic function, elevated diastolic pressures.  Active Problems:   Essential hypertension, benign -Continue IV Lasix, lisinopril -Hold off on beta-blocker due to cocaine     Cocaine use disorder, severe, dependence (HCC) -UDS positive for cocaine, counseled strongly on cocaine cessation    Chronic paranoid schizophrenia (HCC) -Resume psych medications     Homelessness -Social work and case management consulted    HLD (hyperlipidemia) Resume Lipitor  Diabetes mellitus type 2, insulin-dependent, uncontrolled -Resume Lantus, sliding scale insulin  Tobacco use -Counseled on tobacco cessation, ordered nicotine patch  DVT prophylaxis: Lovenox  CODE STATUS: Full CODE STATUS  Consults called: None  Family Communication: Admission, patients condition and plan of care including tests being ordered have been discussed with the patient who indicates understanding and agree with the plan and Code Status  Admission status: Inpatient telemetry  Disposition plan: Further plan will depend as patient's clinical course evolves and further radiologic and laboratory data become available.    At the time of admission, it appears that the appropriate admission status for this patient is INPATIENT . This is judged to be reasonable and necessary in order to provide the required intensity of service to ensure the  patient's safety given the presenting symptoms acute on chronic CHF with respiratory failure, abdominal distention with dyspnea, physical exam findings, and initial radiographic and laboratory data in the context of their chronic comorbidities.  The medical decision making on this patient was of high complexity and the patient is at high risk for clinical deterioration, therefore this is a level 3 visit.    Time Spent on Admission: 60 minutes      M.D. [facility] 12/02/2018, 9:38 AM
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
## 2018-12-02 NOTE — Progress Notes (Signed)
Assumed care from previous RN. Agree with earlier assessment. Pt has been very agitated with cousin on the phone and yelling, thrashing in bed and throwing the phone. Have had to calm him down several times. Also having discomfort in excoriated areas of skin in perineal area.  Melton Alar, RN

## 2018-12-02 NOTE — Progress Notes (Signed)
Informed by lab personnel that patient has troponin of 0.03, same value as past two previous lab draws.

## 2018-12-02 NOTE — ED Notes (Signed)
Bed: WA21 Expected date:  Expected time:  Means of arrival:  Comments: EMS 58 yo male hyperglycemia/SOB/4l/[place]

## 2018-12-02 NOTE — ED Notes (Signed)
Called respiratory due to oxygen saturation of 80% on non-rebreather. Dr. Read Drivers made aware.

## 2018-12-03 LAB — BASIC METABOLIC PANEL
Anion gap: 7 (ref 5–15)
BUN: 24 mg/dL — ABNORMAL HIGH (ref 6–20)
CO2: 29 mmol/L (ref 22–32)
Calcium: 8.3 mg/dL — ABNORMAL LOW (ref 8.9–10.3)
Chloride: 105 mmol/L (ref 98–111)
Creatinine, Ser: 0.98 mg/dL (ref 0.61–1.24)
GFR calc Af Amer: >60 mL/min (ref >60)
GFR calc non Af Amer: >60 mL/min (ref >60)
Glucose, Bld: 193 mg/dL — ABNORMAL HIGH (ref 70–99)
Potassium: 3.9 mmol/L (ref 3.5–5.1)
Sodium: 141 mmol/L (ref 135–145)

## 2018-12-03 LAB — GLUCOSE, CAPILLARY
Glucose-Capillary: 231 mg/dL — ABNORMAL HIGH (ref 70–99)
Glucose-Capillary: 261 mg/dL — ABNORMAL HIGH (ref 70–99)

## 2018-12-03 MED ORDER — BISACODYL 5 MG PO TBEC
10.0000 mg | DELAYED_RELEASE_TABLET | Freq: Once | ORAL | Status: AC
  Administered 2018-12-03: 10 mg via ORAL
  Filled 2018-12-03: qty 2

## 2018-12-03 MED ORDER — DOCUSATE SODIUM 100 MG PO CAPS
200.0000 mg | ORAL_CAPSULE | Freq: Every day | ORAL | Status: DC
  Administered 2018-12-03: 200 mg via ORAL
  Filled 2018-12-03: qty 2

## 2018-12-03 MED ORDER — MUPIROCIN CALCIUM 2 % EX CREA
TOPICAL_CREAM | Freq: Four times a day (QID) | CUTANEOUS | Status: DC
  Administered 2018-12-03: 09:00:00 via TOPICAL
  Filled 2018-12-03: qty 15

## 2018-12-03 NOTE — Discharge Summary (Signed)
I was notified by the nursing staff that the patient left AGAINST MEDICAL ADVICE even before I was able to communicate with the patient.  I was able to see him briefly this morning and had advised him to continue the treatment in the hospital but despite that conversation of risk versus benefits, he has opted to leave AGAINST MEDICAL ADVICE.  Of note, patient has had recurrent admissions to the hospital and has left AGAINST MEDICAL ADVICE in several occasions.  No discharge instructions were given to the patient since the patient did not wish to wait for it.  I have however advised him to come back to the hospital for worsening symptoms.

## 2018-12-03 NOTE — Progress Notes (Signed)
I agree with the Previous RN's assessment. 

## 2018-12-03 NOTE — Progress Notes (Signed)
Inpatient Diabetes Program Recommendations  AACE/ADA: New Consensus Statement on Inpatient Glycemic Control (2015)  Target Ranges:  Prepandial:   less than 140 mg/dL      Peak postprandial:   less than 180 mg/dL (1-2 hours)      Critically ill patients:  140 - 180 mg/dL   Lab Results  Component Value Date   GLUCAP 231 (H) 12/03/2018   HGBA1C 9.4 (H) 12/02/2018    Review of Glycemic Control  Diabetes history: DM2 Outpatient Diabetes medications: Lantus 14 units QHS (not taking) Current orders for Inpatient glycemic control: Lantus 14 units QHS, Novolog 0-9 units tidwc and hs  Eating 100%. Needs case manager/social work consult for f/u in the OP setting.  Inpatient Diabetes Program Recommendations:     Add Novolog 3 units tidwc for meal coverage insulin  Will continue to follow.  Thank you. Ailene Ards, RD, LDN, CDE Inpatient Diabetes Coordinator [phone]

## 2018-12-03 NOTE — Progress Notes (Signed)
Patient requested a laxative. PCP was notified

## 2018-12-03 NOTE — Progress Notes (Addendum)
Patient has a lesion on his penis and scrotum is excoriated. PCP was notified

## 2018-12-04 ENCOUNTER — Emergency Department (HOSPITAL_COMMUNITY): Payer: Medicare Other

## 2018-12-04 ENCOUNTER — Encounter (HOSPITAL_COMMUNITY): Payer: Self-pay | Admitting: General Practice

## 2018-12-04 ENCOUNTER — Other Ambulatory Visit: Payer: Self-pay

## 2018-12-04 ENCOUNTER — Inpatient Hospital Stay (HOSPITAL_COMMUNITY)
Admission: EM | Admit: 2018-12-04 | Discharge: 2018-12-10 | DRG: 292 | Disposition: A | Discharge status: Home / Self Care | Payer: Medicare Other | Attending: Internal Medicine | Admitting: Internal Medicine

## 2018-12-04 DIAGNOSIS — Z833 Family history of diabetes mellitus: Secondary | ICD-10-CM | POA: Diagnosis not present

## 2018-12-04 DIAGNOSIS — Z66 Do not resuscitate: Secondary | ICD-10-CM | POA: Diagnosis present

## 2018-12-04 DIAGNOSIS — E7800 Pure hypercholesterolemia, unspecified: Secondary | ICD-10-CM | POA: Diagnosis not present

## 2018-12-04 DIAGNOSIS — J449 Chronic obstructive pulmonary disease, unspecified: Secondary | ICD-10-CM | POA: Diagnosis present

## 2018-12-04 DIAGNOSIS — Z888 Allergy status to other drugs, medicaments and biological substances status: Secondary | ICD-10-CM

## 2018-12-04 DIAGNOSIS — F17210 Nicotine dependence, cigarettes, uncomplicated: Secondary | ICD-10-CM | POA: Diagnosis present

## 2018-12-04 DIAGNOSIS — Z794 Long term (current) use of insulin: Secondary | ICD-10-CM | POA: Diagnosis not present

## 2018-12-04 DIAGNOSIS — Z8249 Family history of ischemic heart disease and other diseases of the circulatory system: Secondary | ICD-10-CM | POA: Diagnosis not present

## 2018-12-04 DIAGNOSIS — M79673 Pain in unspecified foot: Secondary | ICD-10-CM | POA: Diagnosis present

## 2018-12-04 DIAGNOSIS — E1140 Type 2 diabetes mellitus with diabetic neuropathy, unspecified: Secondary | ICD-10-CM | POA: Diagnosis present

## 2018-12-04 DIAGNOSIS — Z91410 Personal history of adult physical and sexual abuse: Secondary | ICD-10-CM

## 2018-12-04 DIAGNOSIS — I5023 Acute on chronic systolic (congestive) heart failure: Secondary | ICD-10-CM | POA: Diagnosis not present

## 2018-12-04 DIAGNOSIS — I509 Heart failure, unspecified: Secondary | ICD-10-CM

## 2018-12-04 DIAGNOSIS — E876 Hypokalemia: Secondary | ICD-10-CM | POA: Diagnosis not present

## 2018-12-04 DIAGNOSIS — I10 Essential (primary) hypertension: Secondary | ICD-10-CM | POA: Diagnosis present

## 2018-12-04 DIAGNOSIS — G8929 Other chronic pain: Secondary | ICD-10-CM | POA: Diagnosis present

## 2018-12-04 DIAGNOSIS — Z590 Homelessness: Secondary | ICD-10-CM | POA: Diagnosis not present

## 2018-12-04 DIAGNOSIS — F1420 Cocaine dependence, uncomplicated: Secondary | ICD-10-CM | POA: Diagnosis present

## 2018-12-04 DIAGNOSIS — Z9119 Patient's noncompliance with other medical treatment and regimen: Secondary | ICD-10-CM

## 2018-12-04 DIAGNOSIS — K08409 Partial loss of teeth, unspecified cause, unspecified class: Secondary | ICD-10-CM | POA: Diagnosis present

## 2018-12-04 DIAGNOSIS — I5043 Acute on chronic combined systolic (congestive) and diastolic (congestive) heart failure: Secondary | ICD-10-CM | POA: Diagnosis present

## 2018-12-04 DIAGNOSIS — Z720 Tobacco use: Secondary | ICD-10-CM | POA: Diagnosis not present

## 2018-12-04 DIAGNOSIS — Z20828 Contact with and (suspected) exposure to other viral communicable diseases: Secondary | ICD-10-CM | POA: Diagnosis present

## 2018-12-04 DIAGNOSIS — E119 Type 2 diabetes mellitus without complications: Secondary | ICD-10-CM | POA: Diagnosis not present

## 2018-12-04 DIAGNOSIS — I110 Hypertensive heart disease with heart failure: Secondary | ICD-10-CM | POA: Diagnosis present

## 2018-12-04 DIAGNOSIS — E785 Hyperlipidemia, unspecified: Secondary | ICD-10-CM | POA: Diagnosis present

## 2018-12-04 DIAGNOSIS — Z9114 Patient's other noncompliance with medication regimen: Secondary | ICD-10-CM | POA: Diagnosis not present

## 2018-12-04 DIAGNOSIS — Z0489 Encounter for examination and observation for other specified reasons: Secondary | ICD-10-CM | POA: Diagnosis not present

## 2018-12-04 DIAGNOSIS — F322 Major depressive disorder, single episode, severe without psychotic features: Secondary | ICD-10-CM | POA: Diagnosis not present

## 2018-12-04 DIAGNOSIS — Z008 Encounter for other general examination: Secondary | ICD-10-CM | POA: Diagnosis not present

## 2018-12-04 DIAGNOSIS — Z79899 Other long term (current) drug therapy: Secondary | ICD-10-CM | POA: Diagnosis not present

## 2018-12-04 DIAGNOSIS — F200 Paranoid schizophrenia: Secondary | ICD-10-CM | POA: Diagnosis present

## 2018-12-04 DIAGNOSIS — Z9111 Patient's noncompliance with dietary regimen: Secondary | ICD-10-CM | POA: Diagnosis not present

## 2018-12-04 DIAGNOSIS — F1910 Other psychoactive substance abuse, uncomplicated: Secondary | ICD-10-CM | POA: Diagnosis not present

## 2018-12-04 LAB — POCT I-STAT EG7
Acid-Base Excess: 6 mmol/L — ABNORMAL HIGH (ref 0.0–2.0)
Bicarbonate: 33.5 mmol/L — ABNORMAL HIGH (ref 20.0–28.0)
Calcium, Ion: 1.16 mmol/L (ref 1.15–1.40)
HCT: 34 % — ABNORMAL LOW (ref 39.0–52.0)
Hemoglobin: 11.6 g/dL — ABNORMAL LOW (ref 13.0–17.0)
O2 Saturation: 60 %
Potassium: 3.7 mmol/L (ref 3.5–5.1)
Sodium: 143 mmol/L (ref 135–145)
TCO2: 35 mmol/L — ABNORMAL HIGH (ref 22–32)
pCO2, Ven: 62.6 mmHg — ABNORMAL HIGH (ref 44.0–60.0)
pH, Ven: 7.337 (ref 7.250–7.430)
pO2, Ven: 34 mmHg (ref 32.0–45.0)

## 2018-12-04 LAB — CBC WITH DIFFERENTIAL/PLATELET
Abs Immature Granulocytes: 0.02 10*3/uL (ref 0.00–0.07)
Basophils Absolute: 0 10*3/uL (ref 0.0–0.1)
Basophils Relative: 0 %
Eosinophils Absolute: 0 10*3/uL (ref 0.0–0.5)
Eosinophils Relative: 0 %
HCT: 31.3 % — ABNORMAL LOW (ref 39.0–52.0)
Hemoglobin: 9.1 g/dL — ABNORMAL LOW (ref 13.0–17.0)
Immature Granulocytes: 0 %
Lymphocytes Relative: 10 %
Lymphs Abs: 1 10*3/uL (ref 0.7–4.0)
MCH: 23.6 pg — ABNORMAL LOW (ref 26.0–34.0)
MCHC: 29.1 g/dL — ABNORMAL LOW (ref 30.0–36.0)
MCV: 81.1 fL (ref 80.0–100.0)
Monocytes Absolute: 1 10*3/uL (ref 0.1–1.0)
Monocytes Relative: 11 %
Neutro Abs: 7.2 10*3/uL (ref 1.7–7.7)
Neutrophils Relative %: 79 %
Platelets: 301 10*3/uL (ref 150–400)
RBC: 3.86 MIL/uL — ABNORMAL LOW (ref 4.22–5.81)
RDW: 20.5 % — ABNORMAL HIGH (ref 11.5–15.5)
WBC: 9.2 10*3/uL (ref 4.0–10.5)
nRBC: 0.2 % (ref 0.0–0.2)

## 2018-12-04 LAB — BASIC METABOLIC PANEL
Anion gap: 8 (ref 5–15)
BUN: 17 mg/dL (ref 6–20)
CO2: 28 mmol/L (ref 22–32)
Calcium: 8.4 mg/dL — ABNORMAL LOW (ref 8.9–10.3)
Chloride: 104 mmol/L (ref 98–111)
Creatinine, Ser: 1.1 mg/dL (ref 0.61–1.24)
GFR calc Af Amer: >60 mL/min (ref >60)
GFR calc non Af Amer: >60 mL/min (ref >60)
Glucose, Bld: 290 mg/dL — ABNORMAL HIGH (ref 70–99)
Potassium: 3.6 mmol/L (ref 3.5–5.1)
Sodium: 140 mmol/L (ref 135–145)

## 2018-12-04 LAB — BRAIN NATRIURETIC PEPTIDE: B Natriuretic Peptide: 1044.4 pg/mL — ABNORMAL HIGH (ref 0.0–100.0)

## 2018-12-04 LAB — TROPONIN I
Troponin I: 0.03 ng/mL (ref <0.03)
Troponin I: 0.03 ng/mL (ref <0.03)
Troponin I: 0.03 ng/mL (ref <0.03)

## 2018-12-04 LAB — GLUCOSE, CAPILLARY
Glucose-Capillary: 212 mg/dL — ABNORMAL HIGH (ref 70–99)
Glucose-Capillary: 158 mg/dL — ABNORMAL HIGH (ref 70–99)

## 2018-12-04 LAB — SARS CORONAVIRUS 2: SARS Coronavirus 2: NOT DETECTED

## 2018-12-04 MED ORDER — INSULIN ASPART 100 UNIT/ML ~~LOC~~ SOLN
0.0000 [IU] | Freq: Three times a day (TID) | SUBCUTANEOUS | Status: DC
  Administered 2018-12-04: 5 [IU] via SUBCUTANEOUS
  Administered 2018-12-05 – 2018-12-06 (×3): 3 [IU] via SUBCUTANEOUS
  Administered 2018-12-06: 8 [IU] via SUBCUTANEOUS
  Administered 2018-12-07: 3 [IU] via SUBCUTANEOUS
  Administered 2018-12-07: 5 [IU] via SUBCUTANEOUS
  Administered 2018-12-07 – 2018-12-08 (×4): 3 [IU] via SUBCUTANEOUS

## 2018-12-04 MED ORDER — LISINOPRIL 10 MG PO TABS
10.0000 mg | ORAL_TABLET | Freq: Every day | ORAL | Status: DC
  Administered 2018-12-04 – 2018-12-10 (×7): 10 mg via ORAL
  Filled 2018-12-04 (×7): qty 1

## 2018-12-04 MED ORDER — SODIUM CHLORIDE 0.9 % IV SOLN: 250.0000 mL | INTRAVENOUS | Status: DC | PRN

## 2018-12-04 MED ORDER — CARBAMAZEPINE 200 MG PO TABS
100.0000 mg | ORAL_TABLET | Freq: Two times a day (BID) | ORAL | Status: DC
  Administered 2018-12-04 – 2018-12-10 (×13): 100 mg via ORAL
  Filled 2018-12-04 (×14): qty 0.5

## 2018-12-04 MED ORDER — INSULIN GLARGINE 100 UNIT/ML ~~LOC~~ SOLN
14.0000 [IU] | Freq: Every day | SUBCUTANEOUS | Status: DC
  Administered 2018-12-04 – 2018-12-06 (×3): 14 [IU] via SUBCUTANEOUS
  Filled 2018-12-04 (×4): qty 0.14

## 2018-12-04 MED ORDER — SODIUM CHLORIDE 0.9% FLUSH
3.0000 mL | INTRAVENOUS | Status: DC | PRN
  Administered 2018-12-05: 3 mL via INTRAVENOUS
  Filled 2018-12-04: qty 3

## 2018-12-04 MED ORDER — ENOXAPARIN SODIUM 40 MG/0.4ML ~~LOC~~ SOLN
40.0000 mg | SUBCUTANEOUS | Status: DC
  Administered 2018-12-04 – 2018-12-09 (×6): 40 mg via SUBCUTANEOUS
  Filled 2018-12-04 (×6): qty 0.4

## 2018-12-04 MED ORDER — SODIUM CHLORIDE 0.9% FLUSH
3.0000 mL | Freq: Two times a day (BID) | INTRAVENOUS | Status: DC
  Administered 2018-12-04 – 2018-12-10 (×12): 3 mL via INTRAVENOUS

## 2018-12-04 MED ORDER — ACETAMINOPHEN 325 MG PO TABS
650.0000 mg | ORAL_TABLET | ORAL | Status: DC | PRN
  Administered 2018-12-04 – 2018-12-10 (×20): 650 mg via ORAL
  Filled 2018-12-04 (×20): qty 2

## 2018-12-04 MED ORDER — CARVEDILOL 12.5 MG PO TABS
6.2500 mg | ORAL_TABLET | Freq: Two times a day (BID) | ORAL | Status: DC
  Administered 2018-12-04 – 2018-12-10 (×12): 6.25 mg via ORAL
  Filled 2018-12-04 (×12): qty 1

## 2018-12-04 MED ORDER — INSULIN ASPART 100 UNIT/ML ~~LOC~~ SOLN
0.0000 [IU] | Freq: Every day | SUBCUTANEOUS | Status: DC
  Administered 2018-12-05: 2 [IU] via SUBCUTANEOUS
  Administered 2018-12-06: 4 [IU] via SUBCUTANEOUS
  Administered 2018-12-08: 2 [IU] via SUBCUTANEOUS

## 2018-12-04 MED ORDER — ONDANSETRON HCL 4 MG/2ML IJ SOLN: 4.0000 mg | Freq: Four times a day (QID) | INTRAMUSCULAR | Status: DC | PRN

## 2018-12-04 MED ORDER — FUROSEMIDE 10 MG/ML IJ SOLN
40.0000 mg | Freq: Four times a day (QID) | INTRAMUSCULAR | Status: DC
  Administered 2018-12-04 – 2018-12-09 (×20): 40 mg via INTRAVENOUS
  Filled 2018-12-04 (×21): qty 4

## 2018-12-04 MED ORDER — IPRATROPIUM-ALBUTEROL 20-100 MCG/ACT IN AERS: 1.0000 | INHALATION_SPRAY | Freq: Four times a day (QID) | RESPIRATORY_TRACT | Status: DC

## 2018-12-04 MED ORDER — IPRATROPIUM-ALBUTEROL 0.5-2.5 (3) MG/3ML IN SOLN
3.0000 mL | Freq: Four times a day (QID) | RESPIRATORY_TRACT | Status: DC
  Administered 2018-12-04 – 2018-12-05 (×4): 3 mL via RESPIRATORY_TRACT
  Filled 2018-12-04 (×4): qty 3

## 2018-12-04 MED ORDER — INSULIN ASPART 100 UNIT/ML ~~LOC~~ SOLN
3.0000 [IU] | Freq: Three times a day (TID) | SUBCUTANEOUS | Status: DC
  Administered 2018-12-04 – 2018-12-07 (×8): 3 [IU] via SUBCUTANEOUS

## 2018-12-04 MED ORDER — ZOLPIDEM TARTRATE 5 MG PO TABS
5.0000 mg | ORAL_TABLET | Freq: Once | ORAL | Status: AC
  Administered 2018-12-04: 5 mg via ORAL
  Filled 2018-12-04: qty 1

## 2018-12-04 MED ORDER — FUROSEMIDE 10 MG/ML IJ SOLN
40.0000 mg | Freq: Once | INTRAMUSCULAR | Status: AC
  Administered 2018-12-04: 40 mg via INTRAVENOUS
  Filled 2018-12-04: qty 4

## 2018-12-04 MED ORDER — ASPIRIN EC 81 MG PO TBEC
81.0000 mg | DELAYED_RELEASE_TABLET | Freq: Every day | ORAL | Status: DC
  Administered 2018-12-04 – 2018-12-10 (×7): 81 mg via ORAL
  Filled 2018-12-04 (×7): qty 1

## 2018-12-04 NOTE — Progress Notes (Signed)
Patient on floor-  Patient very SOB, abdomen distended, cracked on lips.  Fish farm manager.   Not long after he came up to floor- started demanding food and ice/drink.    Continue to education regarding needing to restrict fluid and salt.

## 2018-12-04 NOTE — Clinical Social Work Note (Signed)
CSW acknowledges homeless consult. Patient has been given shelter resources on numerous occassions and has been known to refuse them as well. Patient has a home but refuses to live there. If patient actually wants shelter resources, please consult again.  Charlynn Court, CSW [phone]

## 2018-12-04 NOTE — Progress Notes (Signed)
Review of Glycemic Control  Diabetes history: DM2 Outpatient Diabetes medications: Lantus 14 units QHS (not taking) Current orders for Inpatient glycemic control: None  Eating 100%. Needs case manager/social work consult for f/u in the OP setting.  Inpatient Diabetes Program Recommendations:    Lantus 14 units Q24H Novolog 0-9 units tidwc and hs Add Novolog 3 units tidwc for meal coverage insulin  Will continue to follow.  Thank you. Ailene Ards, RD, LDN, CDE Inpatient Diabetes Coordinator [phone]
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
## 2018-12-05 DIAGNOSIS — E119 Type 2 diabetes mellitus without complications: Secondary | ICD-10-CM

## 2018-12-05 DIAGNOSIS — Z720 Tobacco use: Secondary | ICD-10-CM

## 2018-12-05 DIAGNOSIS — F200 Paranoid schizophrenia: Secondary | ICD-10-CM

## 2018-12-05 DIAGNOSIS — I10 Essential (primary) hypertension: Secondary | ICD-10-CM

## 2018-12-05 DIAGNOSIS — Z794 Long term (current) use of insulin: Secondary | ICD-10-CM

## 2018-12-05 DIAGNOSIS — E7800 Pure hypercholesterolemia, unspecified: Secondary | ICD-10-CM

## 2018-12-05 DIAGNOSIS — F1420 Cocaine dependence, uncomplicated: Secondary | ICD-10-CM

## 2018-12-05 LAB — GLUCOSE, CAPILLARY
Glucose-Capillary: 180 mg/dL — ABNORMAL HIGH (ref 70–99)
Glucose-Capillary: 167 mg/dL — ABNORMAL HIGH (ref 70–99)
Glucose-Capillary: 234 mg/dL — ABNORMAL HIGH (ref 70–99)
Glucose-Capillary: 227 mg/dL — ABNORMAL HIGH (ref 70–99)

## 2018-12-05 LAB — CBC WITH DIFFERENTIAL/PLATELET
Abs Immature Granulocytes: 0.02 10*3/uL (ref 0.00–0.07)
Basophils Absolute: 0 10*3/uL (ref 0.0–0.1)
Basophils Relative: 1 %
Eosinophils Absolute: 0.3 10*3/uL (ref 0.0–0.5)
Eosinophils Relative: 4 %
HCT: 31.8 % — ABNORMAL LOW (ref 39.0–52.0)
Hemoglobin: 9.2 g/dL — ABNORMAL LOW (ref 13.0–17.0)
Immature Granulocytes: 0 %
Lymphocytes Relative: 12 %
Lymphs Abs: 0.8 10*3/uL (ref 0.7–4.0)
MCH: 23.3 pg — ABNORMAL LOW (ref 26.0–34.0)
MCHC: 28.9 g/dL — ABNORMAL LOW (ref 30.0–36.0)
MCV: 80.5 fL (ref 80.0–100.0)
Monocytes Absolute: 0.6 10*3/uL (ref 0.1–1.0)
Monocytes Relative: 9 %
Neutro Abs: 4.9 10*3/uL (ref 1.7–7.7)
Neutrophils Relative %: 74 %
Platelets: 302 10*3/uL (ref 150–400)
RBC: 3.95 MIL/uL — ABNORMAL LOW (ref 4.22–5.81)
RDW: 20 % — ABNORMAL HIGH (ref 11.5–15.5)
WBC: 6.6 10*3/uL (ref 4.0–10.5)
nRBC: 0.5 % — ABNORMAL HIGH (ref 0.0–0.2)

## 2018-12-05 LAB — BASIC METABOLIC PANEL
Anion gap: 5 (ref 5–15)
BUN: 21 mg/dL — ABNORMAL HIGH (ref 6–20)
CO2: 31 mmol/L (ref 22–32)
Calcium: 8.3 mg/dL — ABNORMAL LOW (ref 8.9–10.3)
Chloride: 105 mmol/L (ref 98–111)
Creatinine, Ser: 1.05 mg/dL (ref 0.61–1.24)
GFR calc Af Amer: >60 mL/min (ref >60)
GFR calc non Af Amer: >60 mL/min (ref >60)
Glucose, Bld: 216 mg/dL — ABNORMAL HIGH (ref 70–99)
Potassium: 4 mmol/L (ref 3.5–5.1)
Sodium: 141 mmol/L (ref 135–145)

## 2018-12-05 LAB — TROPONIN I: Troponin I: 0.03 ng/mL (ref <0.03)

## 2018-12-05 LAB — HEMOGLOBIN A1C
Hgb A1c MFr Bld: 9.7 % — ABNORMAL HIGH (ref 4.8–5.6)
Mean Plasma Glucose: 232 mg/dL

## 2018-12-05 MED ORDER — LORAZEPAM 2 MG/ML IJ SOLN
0.5000 mg | Freq: Once | INTRAMUSCULAR | Status: AC
  Administered 2018-12-05: 0.5 mg via INTRAVENOUS
  Filled 2018-12-05: qty 1

## 2018-12-05 MED ORDER — IPRATROPIUM-ALBUTEROL 0.5-2.5 (3) MG/3ML IN SOLN: 3.0000 mL | Freq: Three times a day (TID) | RESPIRATORY_TRACT | Status: DC | PRN

## 2018-12-05 NOTE — Progress Notes (Signed)
Patient O2 Stats NT reported was in upper 80's placed on 2l O2. Lungs sounds diminished will continue to monitor.

## 2018-12-05 NOTE — Progress Notes (Signed)
Patient is experiencing decreased urination output only urinated 100 ml after IV lasix
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
## 2018-12-05 NOTE — Progress Notes (Signed)
Pt refuses to use bed alarm

## 2018-12-06 LAB — BASIC METABOLIC PANEL
Anion gap: 8 (ref 5–15)
BUN: 24 mg/dL — ABNORMAL HIGH (ref 6–20)
CO2: 31 mmol/L (ref 22–32)
Calcium: 8.7 mg/dL — ABNORMAL LOW (ref 8.9–10.3)
Chloride: 100 mmol/L (ref 98–111)
Creatinine, Ser: 1.11 mg/dL (ref 0.61–1.24)
GFR calc Af Amer: >60 mL/min (ref >60)
GFR calc non Af Amer: >60 mL/min (ref >60)
Glucose, Bld: 208 mg/dL — ABNORMAL HIGH (ref 70–99)
Potassium: 4 mmol/L (ref 3.5–5.1)
Sodium: 139 mmol/L (ref 135–145)

## 2018-12-06 LAB — GLUCOSE, CAPILLARY
Glucose-Capillary: 189 mg/dL — ABNORMAL HIGH (ref 70–99)
Glucose-Capillary: 179 mg/dL — ABNORMAL HIGH (ref 70–99)
Glucose-Capillary: 284 mg/dL — ABNORMAL HIGH (ref 70–99)
Glucose-Capillary: 303 mg/dL — ABNORMAL HIGH (ref 70–99)

## 2018-12-06 MED ORDER — NICOTINE 21 MG/24HR TD PT24
21.0000 mg | MEDICATED_PATCH | Freq: Every day | TRANSDERMAL | Status: DC
  Administered 2018-12-06 – 2018-12-10 (×5): 21 mg via TRANSDERMAL
  Filled 2018-12-06 (×5): qty 1
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
## 2018-12-07 LAB — BASIC METABOLIC PANEL
Anion gap: 8 (ref 5–15)
BUN: 25 mg/dL — ABNORMAL HIGH (ref 6–20)
CO2: 34 mmol/L — ABNORMAL HIGH (ref 22–32)
Calcium: 8.7 mg/dL — ABNORMAL LOW (ref 8.9–10.3)
Chloride: 98 mmol/L (ref 98–111)
Creatinine, Ser: 1.21 mg/dL (ref 0.61–1.24)
GFR calc Af Amer: >60 mL/min (ref >60)
GFR calc non Af Amer: >60 mL/min (ref >60)
Glucose, Bld: 200 mg/dL — ABNORMAL HIGH (ref 70–99)
Potassium: 3.4 mmol/L — ABNORMAL LOW (ref 3.5–5.1)
Sodium: 140 mmol/L (ref 135–145)

## 2018-12-07 LAB — GLUCOSE, CAPILLARY
Glucose-Capillary: 189 mg/dL — ABNORMAL HIGH (ref 70–99)
Glucose-Capillary: 235 mg/dL — ABNORMAL HIGH (ref 70–99)
Glucose-Capillary: 198 mg/dL — ABNORMAL HIGH (ref 70–99)
Glucose-Capillary: 121 mg/dL — ABNORMAL HIGH (ref 70–99)

## 2018-12-07 LAB — MRSA PCR SCREENING: MRSA by PCR: POSITIVE — AB

## 2018-12-07 MED ORDER — CALCIUM CARBONATE ANTACID 500 MG PO CHEW
400.0000 mg | CHEWABLE_TABLET | Freq: Three times a day (TID) | ORAL | Status: DC | PRN
  Administered 2018-12-07: 400 mg via ORAL
  Filled 2018-12-07: qty 2

## 2018-12-07 MED ORDER — INSULIN GLARGINE 100 UNIT/ML ~~LOC~~ SOLN
15.0000 [IU] | Freq: Every day | SUBCUTANEOUS | Status: DC
  Administered 2018-12-07 – 2018-12-09 (×3): 15 [IU] via SUBCUTANEOUS
  Filled 2018-12-07 (×5): qty 0.15

## 2018-12-07 MED ORDER — INSULIN ASPART 100 UNIT/ML ~~LOC~~ SOLN
4.0000 [IU] | Freq: Three times a day (TID) | SUBCUTANEOUS | Status: DC
  Administered 2018-12-07 – 2018-12-10 (×9): 4 [IU] via SUBCUTANEOUS

## 2018-12-07 NOTE — Progress Notes (Signed)
Inpatient Diabetes Program Recommendations  AACE/ADA: New Consensus Statement on Inpatient Glycemic Control (2015)  Target Ranges:  Prepandial:   less than 140 mg/dL      Peak postprandial:   less than 180 mg/dL (1-2 hours)      Critically ill patients:  140 - 180 mg/dL   Lab Results  Component Value Date   GLUCAP 189 (H) 12/07/2018   HGBA1C 9.7 (H) 12/04/2018    Review of Glycemic Control  Diabetes history: DM2 Outpatient Diabetes medications: Not taking Current orders for Inpatient glycemic control: Lantus 14 units QHS, Novolog 0-15 units tidwc and hs + 3 units tidwc  HgbA1C - 9.7% - down from 10.2% on 10/18/18  Inpatient Diabetes Program Recommendations:     Increase Lantus to 15 units QHS Novolog 4 units tidwc for meal coverage insulin  Continue to follow.  Thank you. Lorenda Peck, RD, LDN, CDE Inpatient Diabetes Coordinator [phone]

## 2018-12-07 NOTE — Progress Notes (Signed)
PROGRESS NOTE  [name] ZOX:[id number] DOB: 09/06/1960 DOA: 12/04/2018 PCP: Patient, No Pcp Per   LOS: 3 days   Brief narrative: 58 y.o.malewith medical history significant ofchronic combined CHF; OSA; schizophrenia; polysubstance abuse; HTN; homelessness; hep C; and DM presented to the hospital with worsening edema.  Patient has had multiple visits to the ED with noncompliance.  In the ED patient was noted to be having edema unable to wean off O2, dropped sats to 85%.   Subjective: Patient states that he has a lot of swelling in his body, denies any chest pain, palpitation or shortness of breath.  Assessment/Plan:  Principal Problem:   Acute on chronic combined systolic and diastolic CHF (congestive heart failure) (HCC) Active Problems:   Insulin-requiring or dependent type II diabetes mellitus (HCC)   Essential hypertension, benign   Cocaine use disorder, severe, dependence (HCC)   Chronic paranoid schizophrenia (HCC)   Tobacco use   HLD (hyperlipidemia)  Acute on chronic combined systolic and diastolic CHF.  Last known ejection fraction of 25 to 30%.  Recurrent admissions for CHF in the past, is noncompliant with medications/dietary discretion.  Continue ACE inhibitor's, Coreg, diuresis.  Patient continues to have fluid overload.  Continue strict intake and output charting.  On IV Lasix every 6 hourly.  Insulin-requiring or dependent type II diabetes mellitus (HCC) -Recent A1c 9.4. Continue Lantus and sliding scale insulin, diabetic diet.  Will closely monitor.  Patient is noncompliant to dietary regimen.  Essential hypertension, benign.  Continue ACE inhibitor  IV diuretics  Cocaine use disorder, severe, dependence .  Continued use. Positive UDS  Chronic paranoid schizophrenia (HCC) -Continue Abilify, and Tegretol.No suicidal ideation or hallucinations currently.   Tobacco use -continue nicotine patch  hyperlipidemia -continue lipitor  VTE  Prophylaxis: Lovenox   Code Status:  DNR   Family Communication: None   Disposition Plan: Home   Consultants:  None  Procedures:  None  Antibiotics: Anti-infectives (From admission, onward)   None      Objective: Vitals:   12/07/18 0459 12/07/18 1143  BP: (!) 147/87 (!) 129/93  Pulse: 79 85  Resp: 20 18  Temp: 98 F (36.7 C) 98.2 F (36.8 C)  SpO2: 97% 95%    Intake/Output Summary (Last 24 hours) at 12/07/2018 1159 Last data filed at 12/07/2018 0849 Gross per 24 hour  Intake 1640 ml  Output 2201 ml  Net -561 ml   Filed Weights   12/05/18 0458 12/06/18 0523 12/07/18 0112  Weight: 114.5 kg 115.9 kg 117.2 kg   Body mass index is 38.14 kg/m.   Physical Exam: GENERAL: Patient is alert awake and oriented. Not in obvious distress.   obese on nasal cannula oxygen HENT: No scleral pallor or icterus. Pupils equally reactive to light. Oral mucosa is moist NECK: is supple, no palpable thyroid enlargement. CHEST: Clear to auscultation. No crackles or wheezes. Non tender on palpation. Diminished breath sounds bilaterally. CVS: S1 and S2 heard, no murmur. Regular rate and rhythm. No pericardial rub. ABDOMEN: Soft, non-tender, bowel sounds are present. No palpable hepato-splenomegaly. EXTREMITIES: Bilateral lower extremity edema to the upper thighs CNS: Cranial nerves are intact. No focal motor or sensory deficits. SKIN: warm and dry without rashes.  Data Review: I have personally reviewed the following laboratory data and studies,  CBC: Recent Labs  Lab 12/02/18 0445 12/02/18 0907 12/04/18 0753 12/04/18 0951 12/05/18 0254  WBC 8.7 7.6 9.2  --  6.6  NEUTROABS 7.2  --  7.2  --  4.9  HGB 9.3* 9.6* 9.1* 11.6* 9.2*  HCT 32.1* 34.8* 31.3* 34.0* 31.8*  MCV 82.1 84.1 81.1  --  80.5  PLT 313 292 301  --  440   Basic Metabolic Panel: Recent Labs  Lab 12/03/18 0550 12/04/18 0753 12/04/18 0951 12/05/18 0254 12/06/18 0457 12/07/18 0538  NA 141 140 143 141 139  140  K 3.9 3.6 3.7 4.0 4.0 3.4*  CL 105 104  --  105 100 98  CO2 29 28  --  31 31 34*  GLUCOSE 193* 290*  --  216* 208* 200*  BUN 24* 17  --  21* 24* 25*  CREATININE 0.98 1.10  --  1.05 1.11 1.21  CALCIUM 8.3* 8.4*  --  8.3* 8.7* 8.7*   Liver Function Tests: No results for input(s): AST, ALT, ALKPHOS, BILITOT, PROT, ALBUMIN in the last 168 hours. No results for input(s): LIPASE, AMYLASE in the last 168 hours. No results for input(s): AMMONIA in the last 168 hours. Cardiac Enzymes: Recent Labs  Lab 12/02/18 2029 12/04/18 0753 12/04/18 1348 12/04/18 2017 12/05/18 0254  TROPONINI 0.03* 0.03* 0.03* 0.03* 0.03*   BNP (last 3 results) Recent Labs    11/26/18 1045 12/02/18 0445 12/04/18 0730  BNP 1,064.6* 1,047.7* 1,044.4*    ProBNP (last 3 results) No results for input(s): PROBNP in the last 8760 hours.  CBG: Recent Labs  Lab 12/06/18 1131 12/06/18 1625 12/06/18 2129 12/07/18 0609 12/07/18 1140  GLUCAP 179* 284* 303* 189* 235*   Recent Results (from the past 240 hour(s))  SARS Coronavirus 2 (CEPHEID - Performed in [place] hospital lab), Hosp Order     Status: None   Collection Time: 12/02/18  4:45 AM  Result Value Ref Range Status   SARS Coronavirus 2 NEGATIVE NEGATIVE Final    Comment: (NOTE) If result is NEGATIVE SARS-CoV-2 target nucleic acids are NOT DETECTED. The SARS-CoV-2 RNA is generally detectable in upper and lower  respiratory specimens during the acute phase of infection. The lowest  concentration of SARS-CoV-2 viral copies this assay can detect is 250  copies / mL. A negative result does not preclude SARS-CoV-2 infection  and should not be used as the sole basis for treatment or other  patient management decisions.  A negative result may occur with  improper specimen collection / handling, submission of specimen other  than nasopharyngeal swab, presence of viral mutation(s) within the  areas targeted by this assay, and inadequate number of viral  copies  (<250 copies / mL). A negative result must be combined with clinical  observations, patient history, and epidemiological information. If result is POSITIVE SARS-CoV-2 target nucleic acids are DETECTED. The SARS-CoV-2 RNA is generally detectable in upper and lower  respiratory specimens dur ing the acute phase of infection.  Positive  results are indicative of active infection with SARS-CoV-2.  Clinical  correlation with patient history and other diagnostic information is  necessary to determine patient infection status.  Positive results do  not rule out bacterial infection or co-infection with other viruses. If result is PRESUMPTIVE POSTIVE SARS-CoV-2 nucleic acids MAY BE PRESENT.   A presumptive positive result was obtained on the submitted specimen  and confirmed on repeat testing.  While 2019 novel coronavirus  (SARS-CoV-2) nucleic acids may be present in the submitted sample  additional confirmatory testing may be necessary for epidemiological  and / or clinical management purposes  to differentiate between  SARS-CoV-2 and other Sarbecovirus currently known to infect humans.  If clinically indicated additional  testing with an alternate test  methodology [phone]([id number]) is advised. The SARS-CoV-2 RNA is generally  detectable in upper and lower respiratory sp ecimens during the acute  phase of infection. The expected result is Negative. Fact Sheet for Patients:  BoilerBrush.com.cyhttps://www.fda.gov/media/136312/download Fact Sheet for Healthcare Providers: https://pope.com/https://www.fda.gov/media/136313/download This test is not yet approved or cleared by the [place] FDA and has been authorized for detection and/or diagnosis of SARS-CoV-2 by FDA under an Emergency Use Authorization (EUA).  This EUA will remain in effect (meaning this test can be used) for the duration of the COVID-19 declaration under Section 564(b)(1) of the Act, 21 U.S.C. section 360bbb-3(b)(1), unless the authorization is terminated  or revoked sooner. Performed at [facility], [address] [place], [place] [address]   SARS Coronavirus 2     Status: None   Collection Time: 12/04/18 10:53 AM  Result Value Ref Range Status   SARS Coronavirus 2 NOT DETECTED NOT DETECTED Final    Comment: (NOTE) SARS-CoV-2 target nucleic acids are NOT DETECTED. The SARS-CoV-2 RNA is generally detectable in upper and lower respiratory specimens during the acute phase of infection.  Negative  results do not preclude SARS-CoV-2 infection, do not rule out co-infections with other pathogens, and should not be used as the sole basis for treatment or other patient management decisions.  Negative results must be combined with clinical observations, patient history, and epidemiological information. The expected result is Not Detected. Fact Sheet for Patients: http://www.biofiredefense.com/wp-content/uploads/2020/03/BIOFIRE-COVID -19-patients.pdf Fact Sheet for Healthcare Providers: http://www.biofiredefense.com/wp-content/uploads/2020/03/BIOFIRE-COVID -19-hcp.pdf This test is not yet approved or cleared by the [place] FDA and  has been authorized for detection and/or diagnosis of SARS-CoV-2 by FDA under an Emergency Use Authorization (EUA).  This EUA will remain in effec t (meaning this test can be used) for the duration of  the COVID-19 declaration under Section 564(b)(1) of the Act, 21 U.S.C. section 360bbb-3(b)(1), unless the authorization is terminated or revoked sooner. Performed at [facility], [address] [place], [place] [address]      Studies: No results found.  Scheduled Meds: . aspirin EC  81 mg Oral Daily  . carbamazepine  100 mg Oral BID  . carvedilol  6.25 mg Oral BID WC  . enoxaparin (LOVENOX) injection  40 mg Subcutaneous Q24H  . furosemide  40 mg Intravenous Q6H  . insulin aspart  0-15 Units Subcutaneous TID WC  . insulin aspart  0-5 Units Subcutaneous QHS  . insulin  aspart  3 Units Subcutaneous TID WC  . insulin glargine  14 Units Subcutaneous QHS  . lisinopril  10 mg Oral Daily  . nicotine  21 mg Transdermal Daily  . sodium chloride flush  3 mL Intravenous Q12H    Continuous Infusions: . sodium chloride       Joycelyn DasLaxman Laurell Coalson, MD  [facility] 12/07/2018

## 2018-12-08 DIAGNOSIS — Z0489 Encounter for examination and observation for other specified reasons: Secondary | ICD-10-CM

## 2018-12-08 DIAGNOSIS — Z008 Encounter for other general examination: Secondary | ICD-10-CM

## 2018-12-08 DIAGNOSIS — F1910 Other psychoactive substance abuse, uncomplicated: Secondary | ICD-10-CM

## 2018-12-08 DIAGNOSIS — Z590 Homelessness: Secondary | ICD-10-CM

## 2018-12-08 LAB — BASIC METABOLIC PANEL
Anion gap: 8 (ref 5–15)
BUN: 25 mg/dL — ABNORMAL HIGH (ref 6–20)
CO2: 34 mmol/L — ABNORMAL HIGH (ref 22–32)
Calcium: 8.9 mg/dL (ref 8.9–10.3)
Chloride: 99 mmol/L (ref 98–111)
Creatinine, Ser: 1.11 mg/dL (ref 0.61–1.24)
GFR calc Af Amer: >60 mL/min (ref >60)
GFR calc non Af Amer: >60 mL/min (ref >60)
Glucose, Bld: 202 mg/dL — ABNORMAL HIGH (ref 70–99)
Potassium: 3.5 mmol/L (ref 3.5–5.1)
Sodium: 141 mmol/L (ref 135–145)

## 2018-12-08 LAB — CBC
HCT: 31.5 % — ABNORMAL LOW (ref 39.0–52.0)
Hemoglobin: 9.4 g/dL — ABNORMAL LOW (ref 13.0–17.0)
MCH: 23.6 pg — ABNORMAL LOW (ref 26.0–34.0)
MCHC: 29.8 g/dL — ABNORMAL LOW (ref 30.0–36.0)
MCV: 78.9 fL — ABNORMAL LOW (ref 80.0–100.0)
Platelets: 275 10*3/uL (ref 150–400)
RBC: 3.99 MIL/uL — ABNORMAL LOW (ref 4.22–5.81)
RDW: 20.1 % — ABNORMAL HIGH (ref 11.5–15.5)
WBC: 7.5 10*3/uL (ref 4.0–10.5)
nRBC: 0 % (ref 0.0–0.2)

## 2018-12-08 LAB — GLUCOSE, CAPILLARY
Glucose-Capillary: 169 mg/dL — ABNORMAL HIGH (ref 70–99)
Glucose-Capillary: 190 mg/dL — ABNORMAL HIGH (ref 70–99)
Glucose-Capillary: 175 mg/dL — ABNORMAL HIGH (ref 70–99)

## 2018-12-08 LAB — MAGNESIUM: Magnesium: 1.9 mg/dL (ref 1.7–2.4)

## 2018-12-08 MED ORDER — LORAZEPAM 2 MG/ML IJ SOLN
1.0000 mg | Freq: Once | INTRAMUSCULAR | Status: DC
  Filled 2018-12-08: qty 1

## 2018-12-08 MED ORDER — MUPIROCIN 2 % EX OINT
1.0000 "application " | TOPICAL_OINTMENT | Freq: Two times a day (BID) | CUTANEOUS | Status: DC
  Administered 2018-12-08 – 2018-12-10 (×5): 1 via NASAL
  Filled 2018-12-08: qty 22

## 2018-12-08 MED ORDER — HYDROCORTISONE 1 % EX CREA
TOPICAL_CREAM | CUTANEOUS | Status: DC | PRN
  Filled 2018-12-08: qty 28

## 2018-12-08 MED ORDER — CHLORHEXIDINE GLUCONATE CLOTH 2 % EX PADS
6.0000 | MEDICATED_PAD | Freq: Every day | CUTANEOUS | Status: DC
  Administered 2018-12-08 – 2018-12-10 (×2): 6 via TOPICAL

## 2018-12-08 NOTE — Consult Note (Signed)
Telepsych Consultation   Reason for Consult:  "capacity evaluation, paranoid schizophrenia, drug abuse, frequent AMAs" Referring Physician:  Dr. Joycelyn DasLaxman Pokhrel Location of Patient: [facility] Location of Provider: [facility]  Patient Identification: [name] MRN:  [id number] Principal Diagnosis: Evaluation by psychiatric service required Diagnosis:  Principal Problem:   Acute on chronic combined systolic and diastolic CHF (congestive heart failure) (HCC) Active Problems:   Insulin-requiring or dependent type II diabetes mellitus (HCC)   Essential hypertension, benign   Cocaine use disorder, severe, dependence (HCC)   Chronic paranoid schizophrenia (HCC)   Tobacco use   HLD (hyperlipidemia)   Total Time spent with patient: 1 hour  Subjective:   [name] is a 58 y.o. male patient admitted with acute on chronic combined systolic and diastolic CHF.  HPI:   Per chart review, patient was admitted with acute on chronic combined systolic and diastolic CHF. He has a history of multiple visits due to noncompliance. Psychiatry is consulted for capacity evaluation to refuse SNF placement. PT recommends SNF placement for acute rehab to increase safety with mobility. He reportedly has a place to live but chooses to be homeless in the setting of ongoing substance use. UDS was positive for cocaine on admission. Home medications include Tegretol 100 mg BID. He was previously taking Abilify 10 mg daily but this has not been restarted since hospitalization.   Of note, [name] was last seen on 5/28 for SI. He reportedly endorsed SI for secondary gain. He admitted to endorsing SI to be placed in a SNF.   On interview, [name] reports reports that he has not been taking his medications due to homelessness. He reports that he would like to be restarted on Abilify. He denies SI, HI or AVH. He reports that he has been "overeating" and attributes his worsening  symptoms of heart failure to poor adherence with his diet. He denies problems with sleep. He understands the recommendation for SNF placement. He acknowledges problems with ambulating and would like rehab to improve his mobility.    Past Psychiatric History: Schizophrenia and cocaine abuse.  Risk to Self:  None. Denies SI.  Risk to Others:  None. Denies HI.  Prior Inpatient Therapy:   He has a history of multiple psychiatric hospitalizations. Prior Outpatient Therapy:  [facility] is [facility].   Past Medical History:  Past Medical History:  Diagnosis Date  . Chronic foot pain   . Cocaine abuse (HCC)   . Diabetes mellitus without complication (HCC)   . Hepatitis C    unsure   . Homelessness   . Hypertension   . Neuropathy   . Polysubstance abuse (HCC)   . Schizophrenia (HCC)   . Sleep apnea   . Systolic and diastolic CHF, chronic (HCC)     Past Surgical History:  Procedure Laterality Date  . MULTIPLE TOOTH EXTRACTIONS     Family History:  Family History  Problem Relation Age of Onset  . Hypertension Other   . Diabetes Other    Family Psychiatric  History: None per chart review.  Social History:  Social History   Substance and Sexual Activity  Alcohol Use Yes   Comment: Daily Drinker      Social History   Substance and Sexual Activity  Drug Use Yes  . Frequency: 7.0 times per week  . Types: "Crack" cocaine, Cocaine, Marijuana   Comment: used cocaine 11-27-18    Social History   Socioeconomic History  . Marital status: Single  Spouse name: Not on file  . Number of children: Not on file  . Years of education: Not on file  . Highest education level: Not on file  Occupational History  . Not on file  Social Needs  . Financial resource strain: Not on file  . Food insecurity:    Worry: Not on file    Inability: Not on file  . Transportation needs:    Medical: Not on file    Non-medical: Not on file  Tobacco Use  . Smoking status: Current Every Day  Smoker    Packs/day: 1.00    Years: 20.00    Pack years: 20.00    Types: Cigarettes  . Smokeless tobacco: Current User  Substance and Sexual Activity  . Alcohol use: Yes    Comment: Daily Drinker   . Drug use: Yes    Frequency: 7.0 times per week    Types: "Crack" cocaine, Cocaine, Marijuana    Comment: used cocaine 11-27-18  . Sexual activity: Not Currently  Lifestyle  . Physical activity:    Days per week: Not on file    Minutes per session: Not on file  . Stress: Not on file  Relationships  . Social connections:    Talks on phone: Not on file    Gets together: Not on file    Attends religious service: Not on file    Active member of club or organization: Not on file    Attends meetings of clubs or organizations: Not on file    Relationship status: Not on file  Other Topics Concern  . Not on file  Social History Narrative   ** Merged History Encounter **       Additional Social History: He is homeless. He has a history of cocaine abuse.     Allergies:   Allergies  Allergen Reactions  . Haldol [Haloperidol] Other (See Comments)    Muscle spasms, loss of voluntary movement. However, pt has taken Thorazine on multiple occasions with no adverse effects.     Labs:  Results for orders placed or performed during the hospital encounter of 12/04/18 (from the past 48 hour(s))  Glucose, capillary     Status: Abnormal   Collection Time: 12/06/18 11:31 AM  Result Value Ref Range   Glucose-Capillary 179 (H) 70 - 99 mg/dL   Comment 1 Notify RN    Comment 2 Document in Chart   Glucose, capillary     Status: Abnormal   Collection Time: 12/06/18  4:25 PM  Result Value Ref Range   Glucose-Capillary 284 (H) 70 - 99 mg/dL   Comment 1 Notify RN    Comment 2 Document in Chart   Glucose, capillary     Status: Abnormal   Collection Time: 12/06/18  9:29 PM  Result Value Ref Range   Glucose-Capillary 303 (H) 70 - 99 mg/dL   Comment 1 Notify RN    Comment 2 Document in Chart    Basic metabolic panel     Status: Abnormal   Collection Time: 12/07/18  5:38 AM  Result Value Ref Range   Sodium 140 135 - 145 mmol/L   Potassium 3.4 (L) 3.5 - 5.1 mmol/L   Chloride 98 98 - 111 mmol/L   CO2 34 (H) 22 - 32 mmol/L   Glucose, Bld 200 (H) 70 - 99 mg/dL   BUN 25 (H) 6 - 20 mg/dL   Creatinine, Ser 1.21 0.61 - 1.24 mg/dL   Calcium 8.7 (L) 8.9 - 10.3  mg/dL   GFR calc non Af Amer >60 >60 mL/min   GFR calc Af Amer >60 >60 mL/min   Anion gap 8 5 - 15    Comment: Performed at [facility], [address] [place], [place] [address]  Glucose, capillary     Status: Abnormal   Collection Time: 12/07/18  6:09 AM  Result Value Ref Range   Glucose-Capillary 189 (H) 70 - 99 mg/dL   Comment 1 Notify RN    Comment 2 Document in Chart   Glucose, capillary     Status: Abnormal   Collection Time: 12/07/18 11:40 AM  Result Value Ref Range   Glucose-Capillary 235 (H) 70 - 99 mg/dL  MRSA PCR Screening     Status: Abnormal   Collection Time: 12/07/18 12:01 PM  Result Value Ref Range   MRSA by PCR POSITIVE (A) NEGATIVE    Comment:        The GeneXpert MRSA Assay (FDA approved for NASAL specimens only), is one component of a comprehensive MRSA colonization surveillance program. It is not intended to diagnose MRSA infection nor to guide or monitor treatment for MRSA infections. CRITICAL RESULT CALLED TO, READ BACK BY AND VERIFIED WITH: RN GRACE SISON [id number] [phone] FCP Performed at [facility], [address] [place], [place] [address]   Glucose, capillary     Status: Abnormal   Collection Time: 12/07/18  4:06 PM  Result Value Ref Range   Glucose-Capillary 198 (H) 70 - 99 mg/dL  Glucose, capillary     Status: Abnormal   Collection Time: 12/07/18  9:16 PM  Result Value Ref Range   Glucose-Capillary 121 (H) 70 - 99 mg/dL   Comment 1 Notify RN    Comment 2 Document in Chart   Basic metabolic panel     Status: Abnormal   Collection Time: 12/08/18  4:34 AM  Result  Value Ref Range   Sodium 141 135 - 145 mmol/L   Potassium 3.5 3.5 - 5.1 mmol/L   Chloride 99 98 - 111 mmol/L   CO2 34 (H) 22 - 32 mmol/L   Glucose, Bld 202 (H) 70 - 99 mg/dL   BUN 25 (H) 6 - 20 mg/dL   Creatinine, Ser 1.471.11 0.61 - 1.24 mg/dL   Calcium 8.9 8.9 - 82.910.3 mg/dL   GFR calc non Af Amer >60 >60 mL/min   GFR calc Af Amer >60 >60 mL/min   Anion gap 8 5 - 15    Comment: Performed at [facility], [address] [place], [place] [address]  CBC     Status: Abnormal   Collection Time: 12/08/18  4:34 AM  Result Value Ref Range   WBC 7.5 4.0 - 10.5 K/uL   RBC 3.99 (L) 4.22 - 5.81 MIL/uL   Hemoglobin 9.4 (L) 13.0 - 17.0 g/dL   HCT 08.631.5 (L) 57.839.0 - 46.952.0 %   MCV 78.9 (L) 80.0 - 100.0 fL   MCH 23.6 (L) 26.0 - 34.0 pg   MCHC 29.8 (L) 30.0 - 36.0 g/dL   RDW 62.920.1 (H) 52.811.5 - 41.315.5 %   Platelets 275 150 - 400 K/uL   nRBC 0.0 0.0 - 0.2 %    Comment: Performed at [facility], [address] [place], [place] [address]  Magnesium     Status: None   Collection Time: 12/08/18  4:34 AM  Result Value Ref Range   Magnesium 1.9 1.7 - 2.4 mg/dL  Comment: Performed at [facility], [address] [place], [place] [address]  Glucose, capillary     Status: Abnormal   Collection Time: 12/08/18  6:18 AM  Result Value Ref Range   Glucose-Capillary 169 (H) 70 - 99 mg/dL   Comment 1 Notify RN    Comment 2 Document in Chart     Medications:  Current Facility-Administered Medications  Medication Dose Route Frequency Provider Last Rate Last Dose  . 0.9 %  sodium chloride infusion  250 mL Intravenous PRN Jonah BlueYates, Jennifer, MD      . acetaminophen (TYLENOL) tablet 650 mg  650 mg Oral Q4H PRN Jonah BlueYates, Jennifer, MD   650 mg at 12/08/18 0442  . aspirin EC tablet 81 mg  81 mg Oral Daily Jonah BlueYates, Jennifer, MD   81 mg at 12/08/18 0849  . calcium carbonate (TUMS - dosed in mg elemental calcium) chewable tablet 400 mg of elemental calcium  400 mg of elemental calcium Oral TID PRN Schorr,  Roma KayserKatherine P, NP   400 mg of elemental calcium at 12/07/18 2116  . carbamazepine (TEGRETOL) tablet 100 mg  100 mg Oral BID Jonah BlueYates, Jennifer, MD   100 mg at 12/08/18 0849  . carvedilol (COREG) tablet 6.25 mg  6.25 mg Oral BID WC Jonah BlueYates, Jennifer, MD   6.25 mg at 12/08/18 0849  . Chlorhexidine Gluconate Cloth 2 % PADS 6 each  6 each Topical Q0600 Pokhrel, Laxman, MD      . enoxaparin (LOVENOX) injection 40 mg  40 mg Subcutaneous Q24H Jonah BlueYates, Jennifer, MD   40 mg at 12/07/18 2115  . furosemide (LASIX) injection 40 mg  40 mg Intravenous Q6H Jonah BlueYates, Jennifer, MD   40 mg at 12/08/18 0429  . insulin aspart (novoLOG) injection 0-15 Units  0-15 Units Subcutaneous TID WC Jonah BlueYates, Jennifer, MD   3 Units at 12/08/18 [phone]  . insulin aspart (novoLOG) injection 0-5 Units  0-5 Units Subcutaneous QHS Jonah BlueYates, Jennifer, MD   4 Units at 12/06/18 2146  . insulin aspart (novoLOG) injection 4 Units  4 Units Subcutaneous TID WC Pokhrel, Laxman, MD   4 Units at 12/08/18 0851  . insulin glargine (LANTUS) injection 15 Units  15 Units Subcutaneous QHS Pokhrel, Laxman, MD   15 Units at 12/07/18 2135  . ipratropium-albuterol (DUONEB) 0.5-2.5 (3) MG/3ML nebulizer solution 3 mL  3 mL Nebulization TID PRN Vassie LollMadera, Carlos, MD      . lisinopril (ZESTRIL) tablet 10 mg  10 mg Oral Daily Jonah BlueYates, Jennifer, MD   10 mg at 12/08/18 0850  . LORazepam (ATIVAN) injection 1 mg  1 mg Intravenous Once Schorr, Roma KayserKatherine P, NP      . mupirocin ointment (BACTROBAN) 2 % 1 application  1 application Nasal BID Pokhrel, Laxman, MD      . nicotine (NICODERM CQ - dosed in mg/24 hours) patch 21 mg  21 mg Transdermal Daily Bodenheimer, Charles A, NP   21 mg at 12/08/18 0849  . ondansetron (ZOFRAN) injection 4 mg  4 mg Intravenous Q6H PRN Jonah BlueYates, Jennifer, MD      . sodium chloride flush (NS) 0.9 % injection 3 mL  3 mL Intravenous Q12H Jonah BlueYates, Jennifer, MD   3 mL at 12/08/18 0851  . sodium chloride flush (NS) 0.9 % injection 3 mL  3 mL Intravenous PRN Jonah BlueYates, Jennifer,  MD   3 mL at 12/05/18 0407    Musculoskeletal: Strength & Muscle Tone: No atrophy noted. Gait & Station: UTA since patient is lying in bed.  Patient leans: N/A  Psychiatric Specialty Exam: Physical Exam  Nursing note and vitals reviewed. Constitutional: He is oriented to person, place, and time. He appears well-developed and well-nourished.  HENT:  Head: Normocephalic and atraumatic.  Neck: Normal range of motion.  Respiratory: Effort normal.  Musculoskeletal: Normal range of motion.  Neurological: He is alert and oriented to person, place, and time.  Psychiatric: He has a normal mood and affect. His speech is normal and behavior is normal. Judgment and thought content normal. Cognition and memory are normal.    Review of Systems  Gastrointestinal: Negative for constipation, diarrhea, nausea and vomiting.  Psychiatric/Behavioral: Positive for substance abuse. Negative for hallucinations and suicidal ideas. The patient does not have insomnia.   All other systems reviewed and are negative.   Blood pressure (!) 154/88, pulse 82, temperature 98.3 F (36.8 C), temperature source Oral, resp. rate (!) 22, weight 116.5 kg, SpO2 95 %.Body mass index is 37.93 kg/m.  General Appearance: Fairly Groomed, middle aged, African American male, wearing a hospital gown who is lying in bed. NAD.   Eye Contact:  Good  Speech:  Clear and Coherent and Normal Rate  Volume:  Normal  Mood:  Euthymic  Affect:  Appropriate and Congruent  Thought Process:  Goal Directed, Linear and Descriptions of Associations: Intact  Orientation:  Full (Time, Place, and Person)  Thought Content:  Logical  Suicidal Thoughts:  No  Homicidal Thoughts:  No  Memory:  Immediate;   Good Recent;   Good Remote;   Good  Judgement:  Fair  Insight:  Fair  Psychomotor Activity:  Normal  Concentration:  Concentration: Good and Attention Span: Good  Recall:  Good  Fund of Knowledge:  Good  Language:  Good  Akathisia:  No   Handed:  Right  AIMS (if indicated):   N/A  Assets:  Communication Skills Desire for Improvement Financial Resources/Insurance Resilience Social Support  ADL's:  Impaired  Cognition:  WNL  Sleep:   Okay   Assessment:  [name] is a 58 y.o. male who was admitted with acute on chronic combined systolic and diastolic CHF. He denies mood symptoms but would like to be restarted on home Abilify. He denies SI, HI or AVH. He does not appear to be responding to internal stimuli. He demonstrates an understanding of SNF recommendation and is able to list his medical conditions. He is able to consent to SNF placement.   Treatment Plan Summary: -Continue Tegretol 100 mg BID for mood stabilization.  -Restart Abilify 10 mg daily for mood stabilization.  -EKG reviewed and QTc 453 on 6/5. Please closely monitor when starting or increasing QTc prolonging agents.  -Patient demonstrates capacity to consent to SNF placement and fortunately is agreeable to placement.    Disposition: No evidence of imminent risk to self or others at present.   Patient does not meet criteria for psychiatric inpatient admission.  This service was provided via telemedicine using a 2-way, interactive audio and video technology.  Names of all persons participating in this telemedicine service and their role in this encounter. Name: Juanetta Beets, DO Role: Psychiatrist  Name: [name] Role: Patient    Cherly Beach, DO 12/08/2018 10:35 AM

## 2018-12-08 NOTE — Progress Notes (Addendum)
Pt consistently asking for food  And drinks, explained pt about fluid restriction, and his diet as he is diabetic. At some point pt argues about it. Pt education provided.  0400 PT stated on the call bell that he will sign the AMA and leave. Form was ready.  DR Lisabeth Devoid was made aware and gave an order of ativan to calm pt down.  After an hour, RN on pt room, Pt never say anything about leaving, pt calms down. Ativan not needed at this time.  PT still demanding an electric wheelchair. Will pass on day shift.

## 2018-12-08 NOTE — Progress Notes (Signed)
Pt had a PR of 35 and missed beat rhythm, upon assessment pt denies lightheadedness, No dizziness. No complains except asking for more drinks. Will monitor

## 2018-12-08 NOTE — Progress Notes (Signed)
PROGRESS NOTE  Mellody MemosFrederick L Lill ZOX:[id number] DOB: May 25, 1961 DOA: 12/04/2018 PCP: Patient, No Pcp Per   LOS: 4 days   Brief narrative: 58 y.o.malewith medical history significant ofchronic combined CHF; OSA; schizophrenia; polysubstance abuse; HTN; homelessness; hep C; and DM presented to the hospital with worsening edema.  Patient has had multiple visits to the ED and recurrent hospital admissions with multiple AMAs, medication and dietary noncompliance.  In the ED patient was noted to be having peripheral edema and was unable to wean off O2, dropped sats to 85%.  Subsequently, patient was admitted to hospital for IV diuresis.  Subjective: Patient states that he would like to go home but demands electric wheelchair.  Patient denies any Chest pain, palpitation, fever, shortness of breath.  Assessment/Plan:  Principal Problem:   Acute on chronic combined systolic and diastolic CHF (congestive heart failure) (HCC) Active Problems:   Insulin-requiring or dependent type II diabetes mellitus (HCC)   Essential hypertension, benign   Cocaine use disorder, severe, dependence (HCC)   Chronic paranoid schizophrenia (HCC)   Tobacco use   HLD (hyperlipidemia)  Acute on chronic combined systolic and diastolic CHF.  Last known ejection fraction of 25 to 30%.  Recurrent admissions for CHF in the past, is noncompliant with medications/dietary regimen.  Continue ACE inhibitor's, Coreg, diuresis.  Patient continues to have fluid overload due to noncompliance and inadequate treatment in the past..  Continue strict intake and output charting.  On IV Lasix every 6 hourly.  Patient is negative for 6408 mL so far.  Patient does have a high risk of recurrent admission to the hospital because of noncompliance and leaving the hospital AGAINST MEDICAL ADVICE.  Insulin-requiring or dependent type II diabetes mellitus (HCC) -Recent A1c 9.4. Continue Lantus and sliding scale insulin, diabetic diet.  Will  closely monitor.  Patient is noncompliant to dietary regimen.  Essential hypertension,  Continue ACE inhibitor, Coreg, IV diuretics  Cocaine use disorder, severe, dependence .  Continued use. Positive UDS.  Counseling was done  Mild hypokalemia.  Replenished.  Will closely monitor on diuretics.  Chronic paranoid schizophrenia (HCC) -Likely that disease not under control.  Unlikely that he is taking medications as listed in his record that he was supposed to be on.  Continue Abilify, and Tegretol here in the hospital.  Psychiatry has been consulted in view of his history of paranoid schizophrenia and the noncompliance to the treatment regimen to assess his decision making capacity.  Tobacco use -continue nicotine patch  hyperlipidemia -continue lipitor  VTE Prophylaxis: Lovenox   Code Status:  DNR   Family Communication: None   Disposition Plan: Unknown at this time.  Patient has been recommended for skilled nursing facility in the past several times but he has not complied with it.  Patient states that he has Medicaid and Medicare and would want to have a electric wheelchair prior to discharge.  Will obtain psychiatric evaluation.   Consultants:  Psychiatry  Procedures:  None  Antibiotics: Anti-infectives (From admission, onward)   None      Objective: Vitals:   12/08/18 0855 12/08/18 0856  BP: (!) 154/88   Pulse: 85 82  Resp:    Temp:    SpO2: (!) 87% 95%    Intake/Output Summary (Last 24 hours) at 12/08/2018 1115 Last data filed at 12/08/2018 1031 Gross per 24 hour  Intake 583 ml  Output 2750 ml  Net -2167 ml   Filed Weights   12/06/18 0523 12/07/18 0112 12/08/18 0424  Weight: 115.9  kg 117.2 kg 116.5 kg   Body mass index is 37.93 kg/m.   Physical Exam: GENERAL: Patient is alert awake and communicative.  Not in obvious distress.   obese on nasal cannula oxygen HENT: No scleral pallor or icterus. Pupils equally reactive to light. Oral mucosa is moist  NECK: is supple, no palpable thyroid enlargement. CHEST: Clear to auscultation. No crackles or wheezes. Non tender on palpation. Diminished breath sounds bilaterally. CVS: S1 and S2 heard, no murmur. Regular rate and rhythm. No pericardial rub. ABDOMEN: Soft, non-tender, bowel sounds are present. No palpable hepato-splenomegaly. EXTREMITIES: Bilateral lower extremity edema improving with diuretics CNS: Cranial nerves are intact. No focal motor or sensory deficits. SKIN: warm and dry without rashes.  Data Review: I have personally reviewed the following laboratory data and studies,  CBC: Recent Labs  Lab 12/02/18 0445 12/02/18 0907 12/04/18 0753 12/04/18 0951 12/05/18 0254 12/08/18 0434  WBC 8.7 7.6 9.2  --  6.6 7.5  NEUTROABS 7.2  --  7.2  --  4.9  --   HGB 9.3* 9.6* 9.1* 11.6* 9.2* 9.4*  HCT 32.1* 34.8* 31.3* 34.0* 31.8* 31.5*  MCV 82.1 84.1 81.1  --  80.5 78.9*  PLT 313 292 301  --  302 356   Basic Metabolic Panel: Recent Labs  Lab 12/04/18 0753 12/04/18 0951 12/05/18 0254 12/06/18 0457 12/07/18 0538 12/08/18 0434  NA 140 143 141 139 140 141  K 3.6 3.7 4.0 4.0 3.4* 3.5  CL 104  --  105 100 98 99  CO2 28  --  31 31 34* 34*  GLUCOSE 290*  --  216* 208* 200* 202*  BUN 17  --  21* 24* 25* 25*  CREATININE 1.10  --  1.05 1.11 1.21 1.11  CALCIUM 8.4*  --  8.3* 8.7* 8.7* 8.9  MG  --   --   --   --   --  1.9   Liver Function Tests: No results for input(s): AST, ALT, ALKPHOS, BILITOT, PROT, ALBUMIN in the last 168 hours. No results for input(s): LIPASE, AMYLASE in the last 168 hours. No results for input(s): AMMONIA in the last 168 hours. Cardiac Enzymes: Recent Labs  Lab 12/02/18 2029 12/04/18 0753 12/04/18 1348 12/04/18 2017 12/05/18 0254  TROPONINI 0.03* 0.03* 0.03* 0.03* 0.03*   BNP (last 3 results) Recent Labs    11/26/18 1045 12/02/18 0445 12/04/18 0730  BNP 1,064.6* 1,047.7* 1,044.4*    ProBNP (last 3 results) No results for input(s): PROBNP in  the last 8760 hours.  CBG: Recent Labs  Lab 12/07/18 0609 12/07/18 1140 12/07/18 1606 12/07/18 2116 12/08/18 0618  GLUCAP 189* 235* 198* 121* 169*   Recent Results (from the past 240 hour(s))  SARS Coronavirus 2 (CEPHEID - Performed in [place] hospital lab), Hosp Order     Status: None   Collection Time: 12/02/18  4:45 AM  Result Value Ref Range Status   SARS Coronavirus 2 NEGATIVE NEGATIVE Final    Comment: (NOTE) If result is NEGATIVE SARS-CoV-2 target nucleic acids are NOT DETECTED. The SARS-CoV-2 RNA is generally detectable in upper and lower  respiratory specimens during the acute phase of infection. The lowest  concentration of SARS-CoV-2 viral copies this assay can detect is 250  copies / mL. A negative result does not preclude SARS-CoV-2 infection  and should not be used as the sole basis for treatment or other  patient management decisions.  A negative result may occur with  improper specimen collection / handling,  submission of specimen other  than nasopharyngeal swab, presence of viral mutation(s) within the  areas targeted by this assay, and inadequate number of viral copies  (<250 copies / mL). A negative result must be combined with clinical  observations, patient history, and epidemiological information. If result is POSITIVE SARS-CoV-2 target nucleic acids are DETECTED. The SARS-CoV-2 RNA is generally detectable in upper and lower  respiratory specimens dur ing the acute phase of infection.  Positive  results are indicative of active infection with SARS-CoV-2.  Clinical  correlation with patient history and other diagnostic information is  necessary to determine patient infection status.  Positive results do  not rule out bacterial infection or co-infection with other viruses. If result is PRESUMPTIVE POSTIVE SARS-CoV-2 nucleic acids MAY BE PRESENT.   A presumptive positive result was obtained on the submitted specimen  and confirmed on repeat testing.   While 2019 novel coronavirus  (SARS-CoV-2) nucleic acids may be present in the submitted sample  additional confirmatory testing may be necessary for epidemiological  and / or clinical management purposes  to differentiate between  SARS-CoV-2 and other Sarbecovirus currently known to infect humans.  If clinically indicated additional testing with an alternate test  methodology [phone]) is advised. The SARS-CoV-2 RNA is generally  detectable in upper and lower respiratory sp ecimens during the acute  phase of infection. The expected result is Negative. Fact Sheet for Patients:  BoilerBrush.com.cyhttps://www.fda.gov/media/136312/download Fact Sheet for Healthcare Providers: https://pope.com/https://www.fda.gov/media/136313/download This test is not yet approved or cleared by the [place] FDA and has been authorized for detection and/or diagnosis of SARS-CoV-2 by FDA under an Emergency Use Authorization (EUA).  This EUA will remain in effect (meaning this test can be used) for the duration of the COVID-19 declaration under Section 564(b)(1) of the Act, 21 U.S.C. section 360bbb-3(b)(1), unless the authorization is terminated or revoked sooner. Performed at [facility], [address] [place], [place] [address]   SARS Coronavirus 2     Status: None   Collection Time: 12/04/18 10:53 AM  Result Value Ref Range Status   SARS Coronavirus 2 NOT DETECTED NOT DETECTED Final    Comment: (NOTE) SARS-CoV-2 target nucleic acids are NOT DETECTED. The SARS-CoV-2 RNA is generally detectable in upper and lower respiratory specimens during the acute phase of infection.  Negative  results do not preclude SARS-CoV-2 infection, do not rule out co-infections with other pathogens, and should not be used as the sole basis for treatment or other patient management decisions.  Negative results must be combined with clinical observations, patient history, and epidemiological information. The expected result is Not  Detected. Fact Sheet for Patients: http://www.biofiredefense.com/wp-content/uploads/2020/03/BIOFIRE-COVID -19-patients.pdf Fact Sheet for Healthcare Providers: http://www.biofiredefense.com/wp-content/uploads/2020/03/BIOFIRE-COVID -19-hcp.pdf This test is not yet approved or cleared by the [place] FDA and  has been authorized for detection and/or diagnosis of SARS-CoV-2 by FDA under an Emergency Use Authorization (EUA).  This EUA will remain in effec t (meaning this test can be used) for the duration of  the COVID-19 declaration under Section 564(b)(1) of the Act, 21 U.S.C. section 360bbb-3(b)(1), unless the authorization is terminated or revoked sooner. Performed at [facility], [address] [place], [place] [address]   MRSA PCR Screening     Status: Abnormal   Collection Time: 12/07/18 12:01 PM  Result Value Ref Range Status   MRSA by PCR POSITIVE (A) NEGATIVE Final    Comment:        The GeneXpert MRSA Assay (FDA approved for NASAL  specimens only), is one component of a comprehensive MRSA colonization surveillance program. It is not intended to diagnose MRSA infection nor to guide or monitor treatment for MRSA infections. CRITICAL RESULT CALLED TO, READ BACK BY AND VERIFIED WITH: RN GRACE SISON 1348 [phone] FCP Performed at [facility], [address] [place], [place] [address]      Studies: No results found.  Scheduled Meds: . aspirin EC  81 mg Oral Daily  . carbamazepine  100 mg Oral BID  . carvedilol  6.25 mg Oral BID WC  . Chlorhexidine Gluconate Cloth  6 each Topical Q0600  . enoxaparin (LOVENOX) injection  40 mg Subcutaneous Q24H  . furosemide  40 mg Intravenous Q6H  . insulin aspart  0-15 Units Subcutaneous TID WC  . insulin aspart  0-5 Units Subcutaneous QHS  . insulin aspart  4 Units Subcutaneous TID WC  . insulin glargine  15 Units Subcutaneous QHS  . lisinopril  10 mg Oral Daily  . LORazepam  1 mg Intravenous Once  . mupirocin  ointment  1 application Nasal BID  . nicotine  21 mg Transdermal Daily  . sodium chloride flush  3 mL Intravenous Q12H    Continuous Infusions: . sodium chloride       Joycelyn DasLaxman Zoejane Gaulin, MD  [facility] 12/08/2018
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
## 2018-12-09 LAB — GLUCOSE, CAPILLARY
Glucose-Capillary: 208 mg/dL — ABNORMAL HIGH (ref 70–99)
Glucose-Capillary: 190 mg/dL — ABNORMAL HIGH (ref 70–99)
Glucose-Capillary: 128 mg/dL — ABNORMAL HIGH (ref 70–99)
Glucose-Capillary: 158 mg/dL — ABNORMAL HIGH (ref 70–99)
Glucose-Capillary: 145 mg/dL — ABNORMAL HIGH (ref 70–99)

## 2018-12-09 MED ORDER — FUROSEMIDE 10 MG/ML IJ SOLN
60.0000 mg | Freq: Two times a day (BID) | INTRAMUSCULAR | Status: DC
  Administered 2018-12-09 – 2018-12-10 (×2): 60 mg via INTRAVENOUS
  Filled 2018-12-09 (×2): qty 6

## 2018-12-09 MED ORDER — POTASSIUM CHLORIDE CRYS ER 20 MEQ PO TBCR
40.0000 meq | EXTENDED_RELEASE_TABLET | Freq: Once | ORAL | Status: AC
  Administered 2018-12-09: 40 meq via ORAL
  Filled 2018-12-09: qty 2

## 2018-12-09 MED ORDER — INSULIN ASPART 100 UNIT/ML ~~LOC~~ SOLN
0.0000 [IU] | Freq: Three times a day (TID) | SUBCUTANEOUS | Status: DC
  Administered 2018-12-09: 3 [IU] via SUBCUTANEOUS
  Administered 2018-12-09: 2 [IU] via SUBCUTANEOUS
  Administered 2018-12-09: 3 [IU] via SUBCUTANEOUS
  Administered 2018-12-10: 13:00:00 2 [IU] via SUBCUTANEOUS
  Administered 2018-12-10: 3 [IU] via SUBCUTANEOUS

## 2018-12-09 NOTE — Progress Notes (Signed)
PROGRESS NOTE  Mellody MemosFrederick L Henkin ZOX:[id number] DOB: [birth year] DOA: 12/04/2018 PCP: Patient, No Pcp Per   LOS: 5 days   Brief narrative: 58 y.o.malewith medical history significant ofchronic combined CHF; OSA; schizophrenia; polysubstance abuse; HTN; homelessness; hep C; and DM presented to the hospital with worsening edema.  Patient has had multiple visits to the ED and recurrent hospital admissions with multiple AMAs, medication and dietary noncompliance.  In the ED patient was noted to be having peripheral edema and was unable to wean off O2, dropped sats to 85%.  Subsequently, patient was admitted to hospital for IV diuresis.  Subjective: Patient with no fever, chills or rigor.  Denies dyspnea peripheral edema persists but is improving.  Patient is unhappy regarding dietary and fluid restriction.  Assessment/Plan:  Principal Problem:   Evaluation by psychiatric service required Active Problems:   Insulin-requiring or dependent type II diabetes mellitus (HCC)   Essential hypertension, benign   Cocaine use disorder, severe, dependence (HCC)   Chronic paranoid schizophrenia (HCC)   Tobacco use   Acute on chronic combined systolic and diastolic CHF (congestive heart failure) (HCC)   HLD (hyperlipidemia)  Acute on chronic combined systolic and diastolic CHF.  Last known ejection fraction of 25 to 30%.  Recurrent admissions for CHF in the past, is noncompliant with medications/dietary regimen.  Continue ACE inhibitor's, Coreg, diuresis.  Improving fluid overload with fluid sodium restriction and IV diuretics.  Continue strict intake and output charting.  On IV Lasix every 6 hourly.  We will change the frequency of her Lasix to twice a day.  Patient is negative balance for more than 10 L.   Patient does have a high risk of recurrent admission to the hospital because of noncompliance and leaving the hospital AGAINST MEDICAL ADVICE.We will get a standing weight.  Insulin-requiring or  dependent type II diabetes mellitus (HCC) -Recent A1c 9.4. Continue Lantus and sliding scale insulin, diabetic diet.  Will closely monitor.  Patient is noncompliant to dietary regimen.  Diabetic coordinator on board.  Insulin dose has been adjusted.  Essential hypertension,  Continue ACE inhibitor, Coreg, IV diuretics.  Blood pressure appears to be reasonably stable  Cocaine use disorder, severe, dependence .  Continued use. Positive UDS.  Counseling was done.  Seen by psychiatry yesterday.  Medication doses were adjusted yesterday.  Mild hypokalemia.  Replenished.  Will closely monitor on diuretics.  We will continue to replenish  Chronic paranoid schizophrenia [facility](HCC) Seen by psychiatry.  Patient does have capacity to make his medical decisions.  Medications were adjusted.  Tobacco use -continue nicotine patch  hyperlipidemia -continue lipitor  VTE Prophylaxis: Lovenox   Code Status:  DNR   Family Communication: None   Disposition Plan: Physical therapy has recommended skilled nursing facility placement.  Will consult social services.  We will continue with IV diuresis.  Decrease frequency of Lasix today.  We will try to optimize his volume status as much as possible to reduce his risk of recurrent admissions.   Consultants:  Psychiatry  Procedures:  None  Antibiotics: Anti-infectives (From admission, onward)   None      Objective: Vitals:   12/08/18 2009 12/09/18 0609  BP: (!) 151/76 (!) 150/78  Pulse: 90 87  Resp: 20 20  Temp: 98.2 F (36.8 C) 98.1 F (36.7 C)  SpO2: 94% 96%    Intake/Output Summary (Last 24 hours) at 12/09/2018 1015 Last data filed at 12/09/2018 0935 Gross per 24 hour  Intake 1520 ml  Output 5951 ml  Net -4431 ml   Filed Weights   12/07/18 0112 12/08/18 0424 12/09/18 0609  Weight: 117.2 kg 116.5 kg 115.2 kg   Body mass index is 37.51 kg/m.   Physical Exam: GENERAL: Patient is alert awake and communicative.  Not in obvious  distress.   obese on nasal cannula oxygen HENT: No scleral pallor or icterus. Pupils equally reactive to light. Oral mucosa is moist NECK: is supple, no palpable thyroid enlargement. CHEST: Clear to auscultation. No crackles or wheezes. Non tender on palpation. Diminished breath sounds bilaterally. CVS: S1 and S2 heard, no murmur. Regular rate and rhythm. No pericardial rub. ABDOMEN: Soft, non-tender, bowel sounds are present. No palpable hepato-splenomegaly. EXTREMITIES: Bilateral lower extremity pitting edema, improving with diuretics CNS: Cranial nerves are intact. No focal motor or sensory deficits. SKIN: warm and dry without rashes.  Data Review: I have personally reviewed the following laboratory data and studies,  CBC: Recent Labs  Lab 12/04/18 0753 12/04/18 0951 12/05/18 0254 12/08/18 0434  WBC 9.2  --  6.6 7.5  NEUTROABS 7.2  --  4.9  --   HGB 9.1* 11.6* 9.2* 9.4*  HCT 31.3* 34.0* 31.8* 31.5*  MCV 81.1  --  80.5 78.9*  PLT 301  --  302 275   Basic Metabolic Panel: Recent Labs  Lab 12/04/18 0753 12/04/18 0951 12/05/18 0254 12/06/18 0457 12/07/18 0538 12/08/18 0434  NA 140 143 141 139 140 141  K 3.6 3.7 4.0 4.0 3.4* 3.5  CL 104  --  105 100 98 99  CO2 28  --  31 31 34* 34*  GLUCOSE 290*  --  216* 208* 200* 202*  BUN 17  --  21* 24* 25* 25*  CREATININE 1.10  --  1.05 1.11 1.21 1.11  CALCIUM 8.4*  --  8.3* 8.7* 8.7* 8.9  MG  --   --   --   --   --  1.9   Liver Function Tests: No results for input(s): AST, ALT, ALKPHOS, BILITOT, PROT, ALBUMIN in the last 168 hours. No results for input(s): LIPASE, AMYLASE in the last 168 hours. No results for input(s): AMMONIA in the last 168 hours. Cardiac Enzymes: Recent Labs  Lab 12/02/18 2029 12/04/18 0753 12/04/18 1348 12/04/18 2017 12/05/18 0254  TROPONINI 0.03* 0.03* 0.03* 0.03* 0.03*   BNP (last 3 results) Recent Labs    11/26/18 1045 12/02/18 0445 12/04/18 0730  BNP 1,064.6* 1,047.7* 1,044.4*    ProBNP  (last 3 results) No results for input(s): PROBNP in the last 8760 hours.  CBG: Recent Labs  Lab 12/08/18 0618 12/08/18 1132 12/08/18 1621 12/08/18 2125 12/09/18 0605  GLUCAP 169* 190* 175* 208* 190*   Recent Results (from the past 240 hour(s))  SARS Coronavirus 2 (CEPHEID - Performed in [facility] lab), Hosp Order     Status: None   Collection Time: 12/02/18  4:45 AM  Result Value Ref Range Status   SARS Coronavirus 2 NEGATIVE NEGATIVE Final    Comment: (NOTE) If result is NEGATIVE SARS-CoV-2 target nucleic acids are NOT DETECTED. The SARS-CoV-2 RNA is generally detectable in upper and lower  respiratory specimens during the acute phase of infection. The lowest  concentration of SARS-CoV-2 viral copies this assay can detect is 250  copies / mL. A negative result does not preclude SARS-CoV-2 infection  and should not be used as the sole basis for treatment or other  patient management decisions.  A negative result may occur with  improper specimen collection /  handling, submission of specimen other  than nasopharyngeal swab, presence of viral mutation(s) within the  areas targeted by this assay, and inadequate number of viral copies  (<250 copies / mL). A negative result must be combined with clinical  observations, patient history, and epidemiological information. If result is POSITIVE SARS-CoV-2 target nucleic acids are DETECTED. The SARS-CoV-2 RNA is generally detectable in upper and lower  respiratory specimens dur ing the acute phase of infection.  Positive  results are indicative of active infection with SARS-CoV-2.  Clinical  correlation with patient history and other diagnostic information is  necessary to determine patient infection status.  Positive results do  not rule out bacterial infection or co-infection with other viruses. If result is PRESUMPTIVE POSTIVE SARS-CoV-2 nucleic acids MAY BE PRESENT.   A presumptive positive result was obtained on the  submitted specimen  and confirmed on repeat testing.  While 2019 novel coronavirus  (SARS-CoV-2) nucleic acids may be present in the submitted sample  additional confirmatory testing may be necessary for epidemiological  and / or clinical management purposes  to differentiate between  SARS-CoV-2 and other Sarbecovirus currently known to infect humans.  If clinically indicated additional testing with an alternate test  methodology [phone]) is advised. The SARS-CoV-2 RNA is generally  detectable in upper and lower respiratory sp ecimens during the acute  phase of infection. The expected result is Negative. Fact Sheet for Patients:  StrictlyIdeas.no Fact Sheet for Healthcare Providers: BankingDealers.co.za This test is not yet approved or cleared by the [place] FDA and has been authorized for detection and/or diagnosis of SARS-CoV-2 by FDA under an Emergency Use Authorization (EUA).  This EUA will remain in effect (meaning this test can be used) for the duration of the COVID-19 declaration under Section 564(b)(1) of the Act, 21 U.S.C. section 360bbb-3(b)(1), unless the authorization is terminated or revoked sooner. Performed at [facility], [place] [address] [place], [place] [address]   SARS Coronavirus 2     Status: None   Collection Time: 12/04/18 10:53 AM  Result Value Ref Range Status   SARS Coronavirus 2 NOT DETECTED NOT DETECTED Final    Comment: (NOTE) SARS-CoV-2 target nucleic acids are NOT DETECTED. The SARS-CoV-2 RNA is generally detectable in upper and lower respiratory specimens during the acute phase of infection.  Negative  results do not preclude SARS-CoV-2 infection, do not rule out co-infections with other pathogens, and should not be used as the sole basis for treatment or other patient management decisions.  Negative results must be combined with clinical observations, patient history, and  epidemiological information. The expected result is Not Detected. Fact Sheet for Patients: http://www.biofiredefense.com/wp-content/uploads/2020/03/BIOFIRE-COVID -19-patients.pdf Fact Sheet for Healthcare Providers: http://www.biofiredefense.com/wp-content/uploads/2020/03/BIOFIRE-COVID -19-hcp.pdf This test is not yet approved or cleared by the [place] and  has been authorized for detection and/or diagnosis of SARS-CoV-2 by FDA under an Emergency Use Authorization (EUA).  This EUA will remain in effec t (meaning this test can be used) for the duration of  the COVID-19 declaration under Section 564(b)(1) of the Act, 21 U.S.C. section 360bbb-3(b)(1), unless the authorization is terminated or revoked sooner. Performed at [facility], [place] [address] [place],  [address]   MRSA PCR Screening     Status: Abnormal   Collection Time: 12/07/18 12:01 PM  Result Value Ref Range Status   MRSA by PCR POSITIVE (A) NEGATIVE Final    Comment:        The GeneXpert MRSA Assay (FDA approved for  NASAL specimens only), is one component of a comprehensive MRSA colonization surveillance program. It is not intended to diagnose MRSA infection nor to guide or monitor treatment for MRSA infections. CRITICAL RESULT CALLED TO, READ BACK BY AND VERIFIED WITH: RN GRACE SISON [id number] [phone] FCP Performed at [facility], [address] [place], [place] [address]      Studies: No results found.  Scheduled Meds: . aspirin EC  81 mg Oral Daily  . carbamazepine  100 mg Oral BID  . carvedilol  6.25 mg Oral BID WC  . Chlorhexidine Gluconate Cloth  6 each Topical Q0600  . enoxaparin (LOVENOX) injection  40 mg Subcutaneous Q24H  . furosemide  40 mg Intravenous Q6H  . insulin aspart  0-15 Units Subcutaneous TID WC  . insulin aspart  0-5 Units Subcutaneous QHS  . insulin aspart  4 Units Subcutaneous TID WC  . insulin glargine  15 Units Subcutaneous QHS  . lisinopril  10 mg Oral  Daily  . LORazepam  1 mg Intravenous Once  . mupirocin ointment  1 application Nasal BID  . nicotine  21 mg Transdermal Daily  . sodium chloride flush  3 mL Intravenous Q12H    Continuous Infusions: . sodium chloride       Joycelyn DasLaxman Mahlia Fernando, MD  [facility] 12/09/2018

## 2018-12-09 NOTE — Progress Notes (Signed)
Patient is currently in recliner chair. Chair alarm in chair.   Patient had one outburst of loud behavior this PM when he realized the social worker had called his room but he missed the call before she hung up. SW stated she would return call.   Patient has had pain issues addressed twice so far this shift with Tylenol as ordered for leg discomfort.   Legs bilaterally continue to be edematous x 2 and tight to touch. Abdomen rounded and tight to touch..   Meds taken this shift without incidence
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
## 2018-12-10 ENCOUNTER — Encounter (HOSPITAL_COMMUNITY): Payer: Self-pay | Admitting: Emergency Medicine

## 2018-12-10 ENCOUNTER — Emergency Department (HOSPITAL_COMMUNITY): Payer: Medicare Other

## 2018-12-10 ENCOUNTER — Inpatient Hospital Stay (HOSPITAL_COMMUNITY)
Admission: EM | Admit: 2018-12-10 | Discharge: 2018-12-15 | DRG: 291 | Discharge status: Against Medical Advice | Payer: Medicare Other | Attending: Internal Medicine | Admitting: Internal Medicine

## 2018-12-10 DIAGNOSIS — Z7984 Long term (current) use of oral hypoglycemic drugs: Secondary | ICD-10-CM

## 2018-12-10 DIAGNOSIS — E1140 Type 2 diabetes mellitus with diabetic neuropathy, unspecified: Secondary | ICD-10-CM | POA: Diagnosis present

## 2018-12-10 DIAGNOSIS — R112 Nausea with vomiting, unspecified: Secondary | ICD-10-CM | POA: Diagnosis present

## 2018-12-10 DIAGNOSIS — Z008 Encounter for other general examination: Secondary | ICD-10-CM

## 2018-12-10 DIAGNOSIS — E119 Type 2 diabetes mellitus without complications: Secondary | ICD-10-CM

## 2018-12-10 DIAGNOSIS — I5023 Acute on chronic systolic (congestive) heart failure: Secondary | ICD-10-CM | POA: Diagnosis present

## 2018-12-10 DIAGNOSIS — D638 Anemia in other chronic diseases classified elsewhere: Secondary | ICD-10-CM | POA: Diagnosis present

## 2018-12-10 DIAGNOSIS — F17210 Nicotine dependence, cigarettes, uncomplicated: Secondary | ICD-10-CM | POA: Diagnosis present

## 2018-12-10 DIAGNOSIS — D509 Iron deficiency anemia, unspecified: Secondary | ICD-10-CM | POA: Diagnosis present

## 2018-12-10 DIAGNOSIS — R0902 Hypoxemia: Secondary | ICD-10-CM

## 2018-12-10 DIAGNOSIS — Z8249 Family history of ischemic heart disease and other diseases of the circulatory system: Secondary | ICD-10-CM

## 2018-12-10 DIAGNOSIS — J9601 Acute respiratory failure with hypoxia: Secondary | ICD-10-CM | POA: Diagnosis present

## 2018-12-10 DIAGNOSIS — R197 Diarrhea, unspecified: Secondary | ICD-10-CM | POA: Diagnosis present

## 2018-12-10 DIAGNOSIS — I10 Essential (primary) hypertension: Secondary | ICD-10-CM | POA: Diagnosis present

## 2018-12-10 DIAGNOSIS — F322 Major depressive disorder, single episode, severe without psychotic features: Secondary | ICD-10-CM | POA: Diagnosis present

## 2018-12-10 DIAGNOSIS — F200 Paranoid schizophrenia: Secondary | ICD-10-CM | POA: Diagnosis present

## 2018-12-10 DIAGNOSIS — I110 Hypertensive heart disease with heart failure: Principal | ICD-10-CM | POA: Diagnosis present

## 2018-12-10 DIAGNOSIS — Z590 Homelessness: Secondary | ICD-10-CM

## 2018-12-10 DIAGNOSIS — Z79899 Other long term (current) drug therapy: Secondary | ICD-10-CM

## 2018-12-10 DIAGNOSIS — E785 Hyperlipidemia, unspecified: Secondary | ICD-10-CM | POA: Diagnosis present

## 2018-12-10 DIAGNOSIS — R0602 Shortness of breath: Secondary | ICD-10-CM | POA: Diagnosis not present

## 2018-12-10 DIAGNOSIS — Z833 Family history of diabetes mellitus: Secondary | ICD-10-CM

## 2018-12-10 DIAGNOSIS — R109 Unspecified abdominal pain: Secondary | ICD-10-CM | POA: Diagnosis present

## 2018-12-10 DIAGNOSIS — I509 Heart failure, unspecified: Secondary | ICD-10-CM

## 2018-12-10 DIAGNOSIS — Z1159 Encounter for screening for other viral diseases: Secondary | ICD-10-CM

## 2018-12-10 DIAGNOSIS — Z720 Tobacco use: Secondary | ICD-10-CM | POA: Diagnosis present

## 2018-12-10 DIAGNOSIS — F1420 Cocaine dependence, uncomplicated: Secondary | ICD-10-CM | POA: Diagnosis present

## 2018-12-10 LAB — CBC
HCT: 30.4 % — ABNORMAL LOW (ref 39.0–52.0)
HCT: 32.1 % — ABNORMAL LOW (ref 39.0–52.0)
Hemoglobin: 9 g/dL — ABNORMAL LOW (ref 13.0–17.0)
Hemoglobin: 9.4 g/dL — ABNORMAL LOW (ref 13.0–17.0)
MCH: 23.4 pg — ABNORMAL LOW (ref 26.0–34.0)
MCH: 23.3 pg — ABNORMAL LOW (ref 26.0–34.0)
MCHC: 29.6 g/dL — ABNORMAL LOW (ref 30.0–36.0)
MCHC: 29.3 g/dL — ABNORMAL LOW (ref 30.0–36.0)
MCV: 79.2 fL — ABNORMAL LOW (ref 80.0–100.0)
MCV: 79.5 fL — ABNORMAL LOW (ref 80.0–100.0)
Platelets: 234 10*3/uL (ref 150–400)
Platelets: 241 10*3/uL (ref 150–400)
RBC: 3.84 MIL/uL — ABNORMAL LOW (ref 4.22–5.81)
RBC: 4.04 MIL/uL — ABNORMAL LOW (ref 4.22–5.81)
RDW: 20.3 % — ABNORMAL HIGH (ref 11.5–15.5)
RDW: 20.6 % — ABNORMAL HIGH (ref 11.5–15.5)
WBC: 6.7 10*3/uL (ref 4.0–10.5)
WBC: 9.6 10*3/uL (ref 4.0–10.5)
nRBC: 0 % (ref 0.0–0.2)
nRBC: 0 % (ref 0.0–0.2)

## 2018-12-10 LAB — BASIC METABOLIC PANEL
Anion gap: 8 (ref 5–15)
Anion gap: 9 (ref 5–15)
BUN: 25 mg/dL — ABNORMAL HIGH (ref 6–20)
BUN: 27 mg/dL — ABNORMAL HIGH (ref 6–20)
CO2: 34 mmol/L — ABNORMAL HIGH (ref 22–32)
CO2: 33 mmol/L — ABNORMAL HIGH (ref 22–32)
Calcium: 8.7 mg/dL — ABNORMAL LOW (ref 8.9–10.3)
Calcium: 8.8 mg/dL — ABNORMAL LOW (ref 8.9–10.3)
Chloride: 99 mmol/L (ref 98–111)
Chloride: 99 mmol/L (ref 98–111)
Creatinine, Ser: 1.03 mg/dL (ref 0.61–1.24)
Creatinine, Ser: 1.03 mg/dL (ref 0.61–1.24)
GFR calc Af Amer: >60 mL/min (ref >60)
GFR calc Af Amer: >60 mL/min (ref >60)
GFR calc non Af Amer: >60 mL/min (ref >60)
GFR calc non Af Amer: >60 mL/min (ref >60)
Glucose, Bld: 229 mg/dL — ABNORMAL HIGH (ref 70–99)
Glucose, Bld: 182 mg/dL — ABNORMAL HIGH (ref 70–99)
Potassium: 3.9 mmol/L (ref 3.5–5.1)
Potassium: 3.8 mmol/L (ref 3.5–5.1)
Sodium: 141 mmol/L (ref 135–145)
Sodium: 141 mmol/L (ref 135–145)

## 2018-12-10 LAB — GLUCOSE, CAPILLARY
Glucose-Capillary: 196 mg/dL — ABNORMAL HIGH (ref 70–99)
Glucose-Capillary: 145 mg/dL — ABNORMAL HIGH (ref 70–99)

## 2018-12-10 LAB — BRAIN NATRIURETIC PEPTIDE: B Natriuretic Peptide: 949.9 pg/mL — ABNORMAL HIGH (ref 0.0–100.0)

## 2018-12-10 MED ORDER — LISINOPRIL 10 MG PO TABS
10.0000 mg | ORAL_TABLET | Freq: Every day | ORAL | Status: DC
  Administered 2018-12-11 – 2018-12-14 (×4): 10 mg via ORAL
  Filled 2018-12-10 (×4): qty 1

## 2018-12-10 MED ORDER — FUROSEMIDE 40 MG PO TABS
40.0000 mg | ORAL_TABLET | Freq: Two times a day (BID) | ORAL | Status: DC
  Administered 2018-12-11 – 2018-12-13 (×5): 40 mg via ORAL
  Filled 2018-12-10 (×5): qty 1

## 2018-12-10 MED ORDER — CARBAMAZEPINE 200 MG PO TABS
100.0000 mg | ORAL_TABLET | Freq: Two times a day (BID) | ORAL | Status: DC
  Administered 2018-12-11 – 2018-12-14 (×9): 100 mg via ORAL
  Filled 2018-12-10 (×10): qty 0.5

## 2018-12-10 MED ORDER — POTASSIUM CHLORIDE CRYS ER 20 MEQ PO TBCR
20.0000 meq | EXTENDED_RELEASE_TABLET | Freq: Two times a day (BID) | ORAL | Status: DC
  Administered 2018-12-11 – 2018-12-14 (×9): 20 meq via ORAL
  Filled 2018-12-10 (×9): qty 1

## 2018-12-10 MED ORDER — IPRATROPIUM-ALBUTEROL 0.5-2.5 (3) MG/3ML IN SOLN
3.0000 mL | Freq: Four times a day (QID) | RESPIRATORY_TRACT | Status: DC
  Administered 2018-12-11 (×4): 3 mL via RESPIRATORY_TRACT
  Filled 2018-12-10 (×4): qty 3

## 2018-12-10 MED ORDER — ARIPIPRAZOLE 5 MG PO TABS
10.0000 mg | ORAL_TABLET | Freq: Every day | ORAL | Status: DC
  Administered 2018-12-11 – 2018-12-14 (×4): 10 mg via ORAL
  Filled 2018-12-10 (×4): qty 2

## 2018-12-10 MED ORDER — CARVEDILOL 6.25 MG PO TABS
6.2500 mg | ORAL_TABLET | Freq: Two times a day (BID) | ORAL | Status: DC
  Administered 2018-12-11: 6.25 mg via ORAL
  Filled 2018-12-10: qty 1

## 2018-12-10 MED ORDER — METFORMIN HCL 500 MG PO TABS: 500.0000 mg | ORAL_TABLET | Freq: Two times a day (BID) | ORAL | Status: DC

## 2018-12-10 MED ORDER — ARIPIPRAZOLE 10 MG PO TABS: 10.0000 mg | ORAL_TABLET | Freq: Every day | ORAL | 2 refills | Status: DC

## 2018-12-10 MED ORDER — METFORMIN HCL 500 MG PO TABS: 500.0000 mg | ORAL_TABLET | Freq: Two times a day (BID) | ORAL | 2 refills | Status: DC

## 2018-12-10 MED FILL — metFORMIN HCL 500 MG TABS: 500 | 30 days supply | Qty: 60 | Fill #0

## 2018-12-10 NOTE — ED Triage Notes (Signed)
BIB EMS from [facility]. Pt reports he was just released from hospital this afternoon, pt states he requested SNF placement but they discharged him anyways. Pt was provided with wheelchair upon DC but it was left on the side of the road. Pt presents with swelling to BLE and abd, O2 sats 86% RA. Pt placed on 3L [place]. Pt only request at this point in time is milk and a candy bar.

## 2018-12-10 NOTE — TOC Transition Note (Signed)
Transition of Care [facility]) - CM/SW Discharge Note   Patient Details  Name: [name] MRN: [id number] Date of Birth: 1961-01-13  [facility] CM/SW Contact:  Zenon Mayo, RN Phone Number: 12/10/2018, 1:12 PM   Clinical Narrative:    Patient for dc today, he states he stays in [facility] [place], he will take the bus and then meet his niece to take him to his hotel. He will need a wheelchair.  Referral made to [facility] with [employer] for a manuel w/chair.  Edwyna Ready will bring to patient's room.    Final next level of care: Homeless Shelter Barriers to Discharge: No Barriers Identified   Patient Goals and CMS Choice Patient states their goals for this hospitalization and ongoing recovery are:: exercise and walk   Choice offered to / list presented to : NA  Discharge Placement                       Discharge Plan and Services In-house Referral: Clinical Social Work Discharge Planning Services: CM Consult Post Acute Care Choice: NA          DME Arranged: Youth worker wheelchair with seat cushion DME Agency: [employer] Date DME Agency Contacted: 12/10/18 Time DME Agency Contacted: 1311 Representative spoke with at DME Agency: zack HH Arranged: NA [place] Agency: (NA)        Social Determinants of Health (SDOH) Interventions     Readmission Risk Interventions Readmission Risk Prevention Plan 10/05/2018  Transportation Screening Complete  Medication Review Press photographer) Complete  PCP or Specialist appointment within 3-5 days of discharge Complete  HRI or Home Care Consult Patient refused  SW Recovery Care/Counseling Consult Complete  [place] Not Applicable  Some recent data might be hidden

## 2018-12-10 NOTE — Progress Notes (Addendum)
Patient suffers from acute on chronic systolic and diastolic CHF which impairs their ability to perform daily activities like toileting, dressing and grooming in the home.  A walker will not resolve issue with performing activities of daily living.  A wheelchair will allow patient to safely perform daily activities.  Patient is not able to propel themselves in the home using a standard weight wheelchair due to  General weakness.  Patient can self propel in the lightweight wheelchair.

## 2018-12-10 NOTE — ED Provider Notes (Signed)
[facility] Provider Note   CSN: [id number] Arrival date & time: 12/10/18  2200     History   Chief Complaint Chief Complaint  Patient presents with  . Shortness of Breath    HPI [name] is a 58 y.o. male who presents with SOB. PMH significant for CHF, substance abuse (cocaine), homelessness, schizophrenia. He states that he was discharged from the hospital today. It appears SNF was recommended however it was not pursued since the patient has a hx of leaving AMA and has outbursts at staff. He was given a Armed forces training and education officerlightweight wheelchair. He was supposed to go to a hotel tonight but states that he came back here instead because he wants to go to a "rest home". He states he has been bouncing in between jail and the hostpial. He states he can't live on the street anymore because of his medical problems. He can't ambulate and gets SOB with minimal exertion. He was -[id number] during his admission. No fever, cough, chest pain. Of note, psych evaluated pt and they determined he has capacity to make his own decisions.    HPI  Past Medical History:  Diagnosis Date  . Chronic foot pain   . Cocaine abuse (HCC)   . Diabetes mellitus without complication (HCC)   . Hepatitis C    unsure   . Homelessness   . Hypertension   . Neuropathy   . Polysubstance abuse (HCC)   . Schizophrenia (HCC)   . Sleep apnea   . Systolic and diastolic CHF, chronic [facility](HCC)     Patient Active Problem List   Diagnosis Date Noted  . Evaluation by psychiatric service required   . MDD (major depressive disorder), severe (HCC) 11/25/2018  . Pressure injury of skin 11/08/2018  . Elevated troponin 10/18/2018  . HLD (hyperlipidemia) 10/18/2018  . Anxiety 10/18/2018  . CHF (congestive heart failure), NYHA class II, acute on chronic, combined (HCC) 10/18/2018  . Hypertensive urgency 10/12/2018  . Mild renal insufficiency 10/12/2018  . Cellulitis 10/12/2018  . Penile cellulitis    . Adjustment disorder with mixed disturbance of emotions and conduct   . Sleep apnea 10/03/2018  . Hypokalemia 09/17/2018  . Scrotal swelling 09/17/2018  . Urinary hesitancy   . Constipation   . Acute on chronic combined systolic and diastolic CHF (congestive heart failure) (HCC) 08/25/2018  . Tobacco use 08/18/2018  . Homelessness 08/08/2018  . Smoker 08/08/2018  . Prostate enlargement 03/16/2018  . Aortic atherosclerosis (HCC) 03/16/2018  . 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 Chronic foot pain   . Schizoaffective disorder, bipolar type (HCC) 09/30/2016  . Substance induced mood disorder (HCC) 03/13/2015  . Schizophrenia, paranoid type (HCC) 01/17/2015  . Drug hallucinosis (HCC) 10/08/2014  . Chronic paranoid schizophrenia (HCC) 09/07/2014  . 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 Urinary retention   . Cocaine use disorder, severe, dependence (HCC)   . Essential hypertension, benign 03/28/2013  . 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 MULTIPLE TOOTH EXTRACTIONS          Home Medications    Prior to Admission medications   Medication Sig Start Date End Date Taking? Authorizing Provider  ARIPiprazole (ABILIFY) 10 MG tablet Take 1 tablet (10 mg total) by mouth daily. 12/10/18   Pokhrel, Rebekah ChesterfieldLaxman, MD  carbamazepine (TEGRETOL) 200 MG tablet Take 0.5 tablets (100  mg total) by mouth 2 (two) times daily. Patient not taking: Reported on 12/04/2018 11/26/18   Mancel BaleWentz, Elliott, MD  carvedilol (COREG) 6.25 MG tablet Take 1 tablet (6.25 mg total) by mouth 2 (two) times daily with a meal. Patient not taking: Reported on 12/04/2018 11/26/18   Mancel BaleWentz, Elliott, MD  furosemide (LASIX) 40 MG tablet Take 1 tablet (40 mg total) by mouth 2 (two) times daily for 30 days. Patient not taking: Reported on 12/04/2018 11/26/18 12/26/18   Mancel BaleWentz, Elliott, MD  Ipratropium-Albuterol (COMBIVENT) 20-100 MCG/ACT AERS respimat Inhale 1 puff into the lungs every 6 (six) hours. Patient not taking: Reported on 12/04/2018 11/30/18   Palumbo, April, MD  lisinopril (ZESTRIL) 10 MG tablet Take 1 tablet (10 mg total) by mouth daily. Patient not taking: Reported on 12/04/2018 11/26/18   Mancel BaleWentz, Elliott, MD  metFORMIN (GLUCOPHAGE) 500 MG tablet Take 1 tablet (500 mg total) by mouth 2 (two) times daily with a meal. 12/10/18 12/10/19  Pokhrel, Rebekah ChesterfieldLaxman, MD  potassium chloride SA (K-DUR) 20 MEQ tablet Take 1 tablet (20 mEq total) by mouth 2 (two) times daily. Patient not taking: Reported on 12/04/2018 11/30/18   Nicanor AlconPalumbo, April, MD    Family History Family History  Problem Relation Age of Onset  . Hypertension Other   . Diabetes Other     Social History Social History   Tobacco Use  . Smoking status: Current Every Day Smoker    Packs/day: 1.00    Years: 20.00    Pack years: 20.00    Types: Cigarettes  . Smokeless tobacco: Current User  Substance Use Topics  . Alcohol use: Yes    Comment: Daily Drinker   . Drug use: Yes    Frequency: 7.0 times per week    Types: "Crack" cocaine, Cocaine, Marijuana    Comment: used cocaine 11-27-18     Allergies   Haldol [haloperidol]   Review of Systems Review of Systems  Constitutional: Negative for fever.  Respiratory: Positive for shortness of breath. Negative for cough.   Cardiovascular: Positive for leg swelling. Negative for chest pain.  All other systems reviewed and are negative.    Physical Exam Updated Vital Signs BP (!) 156/91   Pulse 95   Temp 98.7 F (37.1 C) (Oral)   Resp 19   SpO2 94%   Physical Exam Vitals signs and nursing note reviewed.  Constitutional:      General: He is not in acute distress.    Appearance: He is well-developed. He is not ill-appearing.     Comments: Cooperative. SOB with talking. Appears overall volume overloaded which is slightly improved since last ED  visit when I saw him. On 3L via [place].  HENT:     Head: Normocephalic and atraumatic.  Eyes:     General: No scleral icterus.       Right eye: No discharge.        Left eye: No discharge.     Conjunctiva/sclera: Conjunctivae normal.     Pupils: Pupils are equal, round, and reactive to light.  Neck:     Musculoskeletal: Normal range of motion.  Cardiovascular:     Rate and Rhythm: Normal rate and regular rhythm.  Pulmonary:     Effort: Pulmonary effort is normal. No respiratory distress.     Breath sounds: Normal breath sounds.  Abdominal:     General: There is no distension.     Palpations: Abdomen is soft.     Comments: Edema of abdomen  Genitourinary:  Comments: Edema of genitals Musculoskeletal:     Right lower leg: Edema present.     Left lower leg: Edema present.  Skin:    General: Skin is warm and dry.  Neurological:     Mental Status: He is alert and oriented to person, place, and time.  Psychiatric:        Behavior: Behavior normal.      ED Treatments / Results  Labs (all labs ordered are listed, but only abnormal results are displayed) Labs Reviewed  CBC - Abnormal; Notable for the following components:      Result Value   RBC 4.04 (*)    Hemoglobin 9.4 (*)    HCT 32.1 (*)    MCV 79.5 (*)    MCH 23.3 (*)    MCHC 29.3 (*)    RDW 20.6 (*)    All other components within normal limits  BASIC METABOLIC PANEL - Abnormal; Notable for the following components:   CO2 33 (*)    Glucose, Bld 182 (*)    BUN 27 (*)    Calcium 8.8 (*)    All other components within normal limits  BRAIN NATRIURETIC PEPTIDE - Abnormal; Notable for the following components:   B Natriuretic Peptide 949.9 (*)    All other components within normal limits  SARS CORONAVIRUS 2 (HOSPITAL ORDER[facility])    EKG    Radiology Dg Chest Port 1 View  Result Date: 12/10/2018 CLINICAL DATA:  Shortness of breath EXAM: PORTABLE CHEST 1 VIEW COMPARISON:   12/04/2018 FINDINGS: There is cardiomegaly with findings of pulmonary edema. There are likely small bilateral pleural effusions. There is likely adjacent compressive atelectasis. There is no pneumothorax. No acute osseous abnormality. IMPRESSION: Cardiomegaly with pulmonary edema. There are persistent small bilateral pleural effusions. Electronically Signed   By: Katherine Mantlehristopher  Green M.D.   On: 12/10/2018 23:22    Procedures Procedures (including critical care time)  Medications Ordered in ED Medications  ARIPiprazole (ABILIFY) tablet 10 mg (has no administration in time range)  carbamazepine (TEGRETOL) tablet 100 mg (has no administration in time range)  carvedilol (COREG) tablet 6.25 mg (has no administration in time range)  furosemide (LASIX) tablet 40 mg (has no administration in time range)  ipratropium-albuterol (DUONEB) 0.5-2.5 (3) MG/3ML nebulizer solution 3 mL (has no administration in time range)  lisinopril (ZESTRIL) tablet 10 mg (has no administration in time range)  metFORMIN (GLUCOPHAGE) tablet 500 mg (has no administration in time range)  potassium chloride SA (K-DUR) CR tablet 20 mEq (has no administration in time range)     Initial Impression / Assessment and Plan / ED Course  I have reviewed the triage vital signs and the nursing notes.  Pertinent labs & imaging results that were available during my care of the patient were reviewed by me and considered in my medical decision making (see chart for details).  58 year old male presents with SOB. He was discharged from the hospital today after diuresis. Pt is still volume overloaded on exam. He is hypoxic as well and requiring 3L of O2. Sats were in the 80s in triage. Labs are overall unremarkable/at baseline. CXR showing pulmonary edema. It appears unchanged from the last CXR on 6/5 if not slightly worse. Shared visit with Dr. Elesa MassedWard. IV Lasix is ordered. SW consult ordered. Will admit for further management. Discussed with Dr.  Clyde LundborgNiu who will admit.  Final Clinical Impressions(s) / ED Diagnoses   Final diagnoses:  Acute on  chronic congestive heart failure, unspecified heart failure type [facility])    ED Discharge Orders    None       Recardo Evangelist, PA-C 12/11/18 0117    Ward, Delice Bison, DO 12/11/18 [id number]
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
## 2018-12-11 DIAGNOSIS — R197 Diarrhea, unspecified: Secondary | ICD-10-CM | POA: Diagnosis present

## 2018-12-11 DIAGNOSIS — Z720 Tobacco use: Secondary | ICD-10-CM

## 2018-12-11 DIAGNOSIS — F1420 Cocaine dependence, uncomplicated: Secondary | ICD-10-CM

## 2018-12-11 DIAGNOSIS — I5023 Acute on chronic systolic (congestive) heart failure: Secondary | ICD-10-CM

## 2018-12-11 DIAGNOSIS — I10 Essential (primary) hypertension: Secondary | ICD-10-CM

## 2018-12-11 DIAGNOSIS — D509 Iron deficiency anemia, unspecified: Secondary | ICD-10-CM | POA: Diagnosis present

## 2018-12-11 DIAGNOSIS — F322 Major depressive disorder, single episode, severe without psychotic features: Secondary | ICD-10-CM

## 2018-12-11 DIAGNOSIS — F200 Paranoid schizophrenia: Secondary | ICD-10-CM

## 2018-12-11 DIAGNOSIS — E119 Type 2 diabetes mellitus without complications: Secondary | ICD-10-CM

## 2018-12-11 DIAGNOSIS — R109 Unspecified abdominal pain: Secondary | ICD-10-CM | POA: Diagnosis present

## 2018-12-11 DIAGNOSIS — R112 Nausea with vomiting, unspecified: Secondary | ICD-10-CM | POA: Diagnosis present

## 2018-12-11 LAB — GLUCOSE, CAPILLARY
Glucose-Capillary: 234 mg/dL — ABNORMAL HIGH (ref 70–99)
Glucose-Capillary: 332 mg/dL — ABNORMAL HIGH (ref 70–99)
Glucose-Capillary: 198 mg/dL — ABNORMAL HIGH (ref 70–99)
Glucose-Capillary: 249 mg/dL — ABNORMAL HIGH (ref 70–99)

## 2018-12-11 LAB — SARS CORONAVIRUS 2: SARS Coronavirus 2: NOT DETECTED

## 2018-12-11 LAB — LIPASE, BLOOD: Lipase: 25 U/L (ref 11–51)

## 2018-12-11 MED ORDER — SODIUM CHLORIDE 0.9 % IV SOLN: 250.0000 mL | INTRAVENOUS | Status: DC | PRN

## 2018-12-11 MED ORDER — HYDRALAZINE HCL 20 MG/ML IJ SOLN: 5.0000 mg | INTRAMUSCULAR | Status: DC | PRN

## 2018-12-11 MED ORDER — FUROSEMIDE 10 MG/ML IJ SOLN
80.0000 mg | Freq: Once | INTRAMUSCULAR | Status: AC
  Administered 2018-12-11: 80 mg via INTRAVENOUS
  Filled 2018-12-11: qty 8

## 2018-12-11 MED ORDER — ONDANSETRON HCL 4 MG/2ML IJ SOLN: 4.0000 mg | Freq: Four times a day (QID) | INTRAMUSCULAR | Status: DC | PRN

## 2018-12-11 MED ORDER — ACETAMINOPHEN 325 MG PO TABS
650.0000 mg | ORAL_TABLET | Freq: Four times a day (QID) | ORAL | Status: DC | PRN
  Administered 2018-12-15: 650 mg via ORAL
  Filled 2018-12-11 (×11): qty 2

## 2018-12-11 MED ORDER — CARVEDILOL 3.125 MG PO TABS
3.1250 mg | ORAL_TABLET | Freq: Two times a day (BID) | ORAL | Status: DC
  Administered 2018-12-11 – 2018-12-14 (×6): 3.125 mg via ORAL
  Filled 2018-12-11 (×6): qty 1

## 2018-12-11 MED ORDER — DM-GUAIFENESIN ER 30-600 MG PO TB12: 1.0000 | ORAL_TABLET | Freq: Two times a day (BID) | ORAL | Status: DC | PRN

## 2018-12-11 MED ORDER — INSULIN ASPART 100 UNIT/ML ~~LOC~~ SOLN
0.0000 [IU] | Freq: Every day | SUBCUTANEOUS | Status: DC
  Administered 2018-12-11 – 2018-12-12 (×2): 2 [IU] via SUBCUTANEOUS
  Administered 2018-12-13: 3 [IU] via SUBCUTANEOUS
  Administered 2018-12-14: 5 [IU] via SUBCUTANEOUS

## 2018-12-11 MED ORDER — HYDROCORTISONE ACETATE 25 MG RE SUPP
25.0000 mg | Freq: Two times a day (BID) | RECTAL | Status: DC
  Administered 2018-12-12 – 2018-12-14 (×3): 25 mg via RECTAL
  Filled 2018-12-11 (×8): qty 1

## 2018-12-11 MED ORDER — INSULIN GLARGINE 100 UNIT/ML ~~LOC~~ SOLN
15.0000 [IU] | Freq: Every day | SUBCUTANEOUS | Status: DC
  Administered 2018-12-11 – 2018-12-14 (×4): 15 [IU] via SUBCUTANEOUS
  Filled 2018-12-11 (×5): qty 0.15

## 2018-12-11 MED ORDER — INSULIN ASPART 100 UNIT/ML ~~LOC~~ SOLN
0.0000 [IU] | Freq: Three times a day (TID) | SUBCUTANEOUS | Status: DC
  Administered 2018-12-11: 3 [IU] via SUBCUTANEOUS
  Administered 2018-12-11: 2 [IU] via SUBCUTANEOUS
  Administered 2018-12-11: 12:00:00 7 [IU] via SUBCUTANEOUS
  Administered 2018-12-12: 2 [IU] via SUBCUTANEOUS
  Administered 2018-12-12: 3 [IU] via SUBCUTANEOUS
  Administered 2018-12-12 – 2018-12-13 (×2): 1 [IU] via SUBCUTANEOUS
  Administered 2018-12-13 – 2018-12-14 (×3): 3 [IU] via SUBCUTANEOUS
  Administered 2018-12-14: 1 [IU] via SUBCUTANEOUS
  Administered 2018-12-14: 17:00:00 3 [IU] via SUBCUTANEOUS
  Administered 2018-12-15: 2 [IU] via SUBCUTANEOUS

## 2018-12-11 MED ORDER — SODIUM CHLORIDE 0.9% FLUSH
3.0000 mL | Freq: Two times a day (BID) | INTRAVENOUS | Status: DC
  Administered 2018-12-11 – 2018-12-14 (×7): 3 mL via INTRAVENOUS

## 2018-12-11 MED ORDER — SODIUM CHLORIDE 0.9% FLUSH: 3.0000 mL | INTRAVENOUS | Status: DC | PRN

## 2018-12-11 MED ORDER — ACETAMINOPHEN 500 MG PO TABS
1000.0000 mg | ORAL_TABLET | Freq: Once | ORAL | Status: AC
  Administered 2018-12-11: 1000 mg via ORAL
  Filled 2018-12-11: qty 2

## 2018-12-11 MED ORDER — ASPIRIN EC 81 MG PO TBEC
81.0000 mg | DELAYED_RELEASE_TABLET | Freq: Every day | ORAL | Status: DC
  Administered 2018-12-11 – 2018-12-14 (×4): 81 mg via ORAL
  Filled 2018-12-11 (×4): qty 1

## 2018-12-11 MED ORDER — ENOXAPARIN SODIUM 40 MG/0.4ML ~~LOC~~ SOLN
40.0000 mg | SUBCUTANEOUS | Status: DC
  Administered 2018-12-12 – 2018-12-15 (×4): 40 mg via SUBCUTANEOUS
  Filled 2018-12-11 (×4): qty 0.4

## 2018-12-11 MED ORDER — ACETAMINOPHEN 325 MG PO TABS
650.0000 mg | ORAL_TABLET | ORAL | Status: DC | PRN
  Administered 2018-12-11 – 2018-12-14 (×9): 650 mg via ORAL
  Filled 2018-12-11 (×2): qty 2

## 2018-12-11 MED ORDER — NICOTINE 21 MG/24HR TD PT24
21.0000 mg | MEDICATED_PATCH | Freq: Every day | TRANSDERMAL | Status: DC
  Administered 2018-12-11 – 2018-12-14 (×4): 21 mg via TRANSDERMAL
  Filled 2018-12-11 (×4): qty 1

## 2018-12-11 NOTE — ED Notes (Signed)
ED TO INPATIENT HANDOFF REPORT  ED Nurse Name and Phone #: [phone] Levander CampionBrittany   S Name/Age/Gender Taylor MemosFrederick L [name] 58 y.o. male Room/Bed: 033C/033C  Code Status   Code Status: Prior  Home/SNF/Other Home Patient oriented [phone]: self, place, time and situation Is this baseline? Yes   Triage Complete: Triage complete  Chief Complaint SOB wants NH placement  Triage Note [facility]IB EMS [facility] Pt reports he was just released from hospital this afternoon, pt states he requested SNF placement but they discharged him anyways. Pt was provided with wheelchair upon DC but it was left on the side of the road. Pt presents with swelling to BLE and abd, O2 sats 86% RA. Pt placed on 3L [place]. Pt only request at this point in time is milk and a candy bar.    Allergies Allergies  Allergen Reactions  . Haldol [Haloperidol] Other (See Comments)    Muscle spasms, loss of voluntary movement. However, pt has taken Thorazine on multiple occasions with no adverse effects.     Level of Care/Admitting Diagnosis ED Disposition    ED Disposition Condition Comment   Admit  [facility] [[id number]]  Level of Care: Telemetry Cardiac [103]  I expect the patient will be discharged within 24 hours: No (not a candidate for 5C-Observation unit)  Covid Evaluation: Screening Protocol (No Symptoms)  Diagnosis: Acute on chronic systolic (congestive) heart failure [facility](HCC) [[id number]]) [[id number]]  Admitting Physician: Lorretta HarpNIU, XILIN [[id number]]  Attending Physician: Lorretta HarpNIU, XILIN [[id number]]  PT Class (Do Not Modify): Observation [104]  PT Acc Code (Do Not Modify): Observation [10022]       B Medical/Surgery History Past Medical History:  Diagnosis Date  . Chronic foot pain   . Cocaine abuse (HCC)   . Diabetes mellitus without complication (HCC)   . Hepatitis C    unsure   . Homelessness   . Hypertension   . Neuropathy   . Polysubstance abuse (HCC)   . Schizophrenia (HCC)   . Sleep apnea   . Systolic and  diastolic CHF, chronic (HCC)    Past Surgical History:  Procedure Laterality Date  . MULTIPLE TOOTH EXTRACTIONS       A IV Location/Drains/Wounds Patient Lines/Drains/Airways Status   Active Line/Drains/Airways    Name:   Placement date:   Placement time:   Site:   Days:   External Urinary Catheter   12/10/18    0138    -   1   Pressure Injury 11/07/18 Stage II -  Partial thickness loss of dermis presenting as a shallow open ulcer with a red, pink wound bed without slough. small dime sized area   11/07/18    1532     34   Pressure Injury 11/07/18 Stage II -  Partial thickness loss of dermis presenting as a shallow open ulcer with a red, pink wound bed without slough. small dime sized area   11/07/18    1535     34          Intake/Output Last 24 hours  Intake/Output Summary (Last 24 hours) at 12/11/2018 0139 Last data filed at 12/11/2018 0138 Gross per 24 hour  Intake -  Output 700 ml  Net -700 ml    Labs/Imaging Results for orders placed or performed during the hospital encounter of 12/10/18 (from the past 48 hour(s))  CBC     Status: Abnormal   Collection Time: 12/10/18 10:05 PM  Result Value Ref Range   WBC 9.6  4.0 - 10.5 K/uL   RBC 4.04 (L) 4.22 - 5.81 MIL/uL   Hemoglobin 9.4 (L) 13.0 - 17.0 g/dL   HCT 32.1 (L) 39.0 - 52.0 %   MCV 79.5 (L) 80.0 - 100.0 fL   MCH 23.3 (L) 26.0 - 34.0 pg   MCHC 29.3 (L) 30.0 - 36.0 g/dL   RDW 20.6 (H) 11.5 - 15.5 %   Platelets 241 150 - 400 K/uL   nRBC 0.0 0.0 - 0.2 %    Comment: Performed at [facility], [place] [address] [place], [place] [address]  Basic metabolic panel     Status: Abnormal   Collection Time: 12/10/18 10:05 PM  Result Value Ref Range   Sodium 141 135 - 145 mmol/L   Potassium 3.8 3.5 - 5.1 mmol/L   Chloride 99 98 - 111 mmol/L   CO2 33 (H) 22 - 32 mmol/L   Glucose, Bld 182 (H) 70 - 99 mg/dL   BUN 27 (H) 6 - 20 mg/dL   Creatinine, Ser 1.03 0.61 - 1.24 mg/dL   Calcium 8.8 (L) 8.9 - 10.3 mg/dL   GFR calc non  Af Amer >60 >60 mL/min   GFR calc Af Amer >60 >60 mL/min   Anion gap 9 5 - 15    Comment: Performed at [facility], [place] [address], [place], [place] [address]  Brain natriuretic peptide     Status: Abnormal   Collection Time: 12/10/18 10:06 PM  Result Value Ref Range   B Natriuretic Peptide 949.9 (H) 0.0 - 100.0 pg/mL    Comment: Performed at [place] [address], [place], [place] [address]   Dg Chest Port 1 View  Result Date: 12/10/2018 CLINICAL DATA:  Shortness of breath EXAM: PORTABLE CHEST 1 VIEW COMPARISON:  12/04/2018 FINDINGS: There is cardiomegaly with findings of pulmonary edema. There are likely small bilateral pleural effusions. There is likely adjacent compressive atelectasis. There is no pneumothorax. No acute osseous abnormality. IMPRESSION: Cardiomegaly with pulmonary edema. There are persistent small bilateral pleural effusions. Electronically Signed   By: Constance Holster M.D.   On: 12/10/2018 23:22    Pending Labs Unresulted Labs (From admission, onward)    Start     Ordered   12/11/18 0052  SARS Coronavirus 2 (CEPHEID - Performed in [place] hospital lab), Hosp Order  (Asymptomatic Patients Labs)  Once,   STAT    Question:  Rule Out  Answer:  Yes   12/11/18 0051   Signed and Held  Lipase, blood  Once,   R     Signed and Held   Signed and Held  Basic metabolic panel  Daily,   R     Signed and Held          Vitals/Pain Today's Vitals   12/10/18 2200 12/10/18 2211 12/10/18 2300 12/10/18 2315  BP: (!) 152/97  (!) 141/82 (!) 156/91  Pulse: (!) 102  97 95  Resp: (!) 26  (!) 21 19  Temp: 98.7 F (37.1 C)     TempSrc: Oral     SpO2: (!) 86%  92% 94%  PainSc:  0-No pain      Isolation Precautions No active isolations  Medications Medications  ARIPiprazole (ABILIFY) tablet 10 mg (has no administration in time range)  carbamazepine (TEGRETOL) tablet 100 mg (100 mg Oral Given 12/11/18 0042)  carvedilol (COREG) tablet 6.25 mg (has no  administration in time range)  furosemide (LASIX) tablet 40 mg (has  no administration in time range)  ipratropium-albuterol (DUONEB) 0.5-2.5 (3) MG/3ML nebulizer solution 3 mL (3 mLs Inhalation Given 12/11/18 0115)  lisinopril (ZESTRIL) tablet 10 mg (has no administration in time range)  metFORMIN (GLUCOPHAGE) tablet 500 mg (has no administration in time range)  potassium chloride SA (K-DUR) CR tablet 20 mEq (20 mEq Oral Given 12/11/18 0042)  acetaminophen (TYLENOL) tablet 1,000 mg (1,000 mg Oral Given 12/11/18 0042)  furosemide (LASIX) injection 80 mg (80 mg Intravenous Given 12/11/18 0050)    Mobility walks with person assist; becomes sob increases on exertion Moderate fall risk   Focused Assessments Pulmonary Assessment Handoff:  Lung sounds: Bilateral Breath Sounds: Clear, Expiratory wheezes        R Recommendations: See Admitting Provider Note  Report given to:   Additional Notes: on 3L [place]; desats quickly on exertion; homeless; recently discharged, requesting snf placement

## 2018-12-11 NOTE — ED Notes (Signed)
Pt had a large, stool formed BM, removed his [place], and was able to turn from side to side for staff to clean him. Sats dropped to77% with increased sob. 3L [place] applied. Pt now speaking to admitting MD via telephone

## 2018-12-11 NOTE — H&P (Signed)
History and Physical    [name] ZDG:[id number] DOB: [birth year] DOA: 12/10/2018  Referring MD/NP/PA:   PCP: Patient, No Pcp Per   Patient coming from:  The patient is homeless.  At baseline, pt is independent for most of ADL.        Chief Complaint: SOB   HPI: Taylor MemosFrederick L Biddy is a 58 y.o. male with medical history significant of homelessness, hypertension, hyperlipidemia, diabetes mellitus, polysubstance abuse, tobacco abuse, cocaine abuse, depression, anxiety, HCV, [name] with EF of 25-30%, schizophrenia, AAA, who presents with shortness of breath.  Patient was recently hospitalized from 6/5-6/11 due to CHF exacerbation.  Patient was just discharged yesterday. It appears SNF was recommended however it was not pursued since the patient has a hx of leaving AMA and has outbursts at staff. Pt was provided with wheelchair upon discharge, but it was left on the side of the road. He states that he has a worsening shortness of breath and a mild dry cough.  Denies chest pain, fever or chills.  He has bilateral leg edema.  He also reports intermittent nausea, vomiting, diarrhea and abdominal pain.  He states that he had approximately 8 times of loose stool bowel movement and vomited once.  His abdominal pain is diffuse, moderate, constant nonradiating.  Denies symptoms of UTI or unilateral weakness. Of note, previous admission, psych evaluated pt and they determined he has capacity to make his own decisions.   ED Course: pt was found to have BNP 949.9, pending COVID-19 test, creatinine 1.03, BUN 29, temperature normal, tachycardia, oxygen desaturated to 86% on room air, which improved to 94% on 3 L nasal cannula oxygen.  Chest x-ray showed pulmonary edema and cardiomegaly.  Patient is placed on telemetry bed for observation.  Review of Systems:   General: no fevers, chills, has poor appetite, has fatigue HEENT: no blurry vision, hearing changes or sore throat Respiratory: has dyspnea,  coughing, no wheezing CV: no chest pain, no palpitations GI: has nausea, vomiting, abdominal pain, diarrhea, no constipation GU: no dysuria, burning on urination, increased urinary frequency, hematuria  Ext: has leg edema Neuro: no unilateral weakness, numbness, or tingling, no vision change or hearing loss Skin: no rash, no skin tear. MSK: No muscle spasm, no deformity, no limitation of range of movement in spin Heme: No easy bruising.  Travel history: No recent long distant travel.  Allergy:  Allergies  Allergen Reactions  . Haldol [Haloperidol] Other (See Comments)    Muscle spasms, loss of voluntary movement. However, pt has taken Thorazine on multiple occasions with no adverse effects.     Past Medical History:  Diagnosis Date  . Chronic foot pain   . Cocaine abuse (HCC)   . Diabetes mellitus without complication (HCC)   . Hepatitis C    unsure   . Homelessness   . Hypertension   . Neuropathy   . Polysubstance abuse (HCC)   . Schizophrenia (HCC)   . Sleep apnea   . Systolic and diastolic CHF, chronic (HCC)     Past Surgical History:  Procedure Laterality Date  . MULTIPLE TOOTH EXTRACTIONS      Social History:  reports that he has been smoking cigarettes. He has a 20.00 pack-year smoking history. He uses smokeless tobacco. He reports current alcohol use. He reports current drug use. Frequency: 7.00 times per week. Drugs: "Crack" cocaine, Cocaine, and Marijuana.  Family History:  Family History  Problem Relation Age of Onset  . Hypertension Other   . Diabetes  Other      Prior to Admission medications   Medication Sig Start Date End Date Taking? Authorizing Provider  ARIPiprazole (ABILIFY) 10 MG tablet Take 1 tablet (10 mg total) by mouth daily. 12/10/18  Yes Pokhrel, Laxman, MD  carbamazepine (TEGRETOL) 200 MG tablet Take 0.5 tablets (100 mg total) by mouth 2 (two) times daily. 11/26/18  Yes Mancel Bale, MD  carvedilol (COREG) 6.25 MG tablet Take 1 tablet (6.25  mg total) by mouth 2 (two) times daily with a meal. 11/26/18  Yes Mancel Bale, MD  furosemide (LASIX) 40 MG tablet Take 1 tablet (40 mg total) by mouth 2 (two) times daily for 30 days. 11/26/18 12/26/18 Yes Mancel Bale, MD  Ipratropium-Albuterol (COMBIVENT) 20-100 MCG/ACT AERS respimat Inhale 1 puff into the lungs every 6 (six) hours. 11/30/18  Yes Palumbo, April, MD  lisinopril (ZESTRIL) 10 MG tablet Take 1 tablet (10 mg total) by mouth daily. 11/26/18  Yes Mancel Bale, MD  metFORMIN (GLUCOPHAGE) 500 MG tablet Take 1 tablet (500 mg total) by mouth 2 (two) times daily with a meal. 12/10/18 12/10/19 Yes Pokhrel, Laxman, MD  potassium chloride SA (K-DUR) 20 MEQ tablet Take 1 tablet (20 mEq total) by mouth 2 (two) times daily. 11/30/18  Yes Palumbo, April, MD    Physical Exam: Vitals:   12/10/18 2315 12/11/18 0030 12/11/18 0242 12/11/18 0357  BP: (!) 156/91 (!) 166/97 (!) 142/83   Pulse: 95 95 89   Resp: Temp:   98.5 F (36.9 C)   TempSrc:   Oral   SpO2: 94% 93% 91%   Weight:    112.8 kg  Height:     (1.753 m)   General: Not in acute distress HEENT:       Eyes: PERRL, EOMI, no scleral icterus.       ENT: No discharge from the ears and nose, no pharynx injection, no tonsillar enlargement.        Neck: Positive JVD, no bruit, no mass felt. Heme: No neck lymph node enlargement. Cardiac: S1/S2, RRR, No murmurs, No gallops or rubs. Respiratory: No rales, wheezing, rhonchi or rubs. GI: Soft, has minimal diffused tenderness, no rebound pain, no organomegaly, BS present. GU: No hematuria Ext: has 2+ pitting leg edema bilaterally. 2+DP/PT pulse bilaterally. Musculoskeletal: No joint deformities, No joint redness or warmth, no limitation of ROM in spin. Skin: No rashes.  Neuro: Alert, oriented X3, cranial nerves II-XII grossly intact, moves all extremities normally.  Psych: Patient is not psychotic, no suicidal or hemocidal ideation.  Labs on Admission: I have personally  reviewed following labs and imaging studies  CBC: Recent Labs  Lab 12/04/18 0753 12/04/18 0951 12/05/18 0254 12/08/18 0434 12/10/18 0343 12/10/18 2205  WBC 9.2  --  6.6 7.5 6.7 9.6  NEUTROABS 7.2  --  4.9  --   --   --   HGB 9.1* 11.6* 9.2* 9.4* 9.0* 9.4*  HCT 31.3* 34.0* 31.8* 31.5* 30.4* 32.1*  MCV 81.1  --  80.5 78.9* 79.2* 79.5*  PLT 301  --  302 275 234 241   Basic Metabolic Panel: Recent Labs  Lab 12/06/18 0457 12/07/18 0538 12/08/18 0434 12/10/18 0343 12/10/18 2205  NA 139 140 141 141 141  K 4.0 3.4* 3.5 3.9 3.8  CL 100 98 99 99 99  CO2 31 34* 34* 34* 33*  GLUCOSE 208* 200* 202* 229* 182*  BUN 24* 25* 25* 25* 27*  CREATININE 1.11 1.21 1.11 1.03 1.03  CALCIUM 8.7* 8.7* 8.9 8.7* 8.8*  MG  --   --  1.9  --   --    GFR: Estimated Creatinine Clearance: 97.9 mL/min (by C-G formula based on SCr of 1.03 mg/dL). Liver Function Tests: No results for input(s): AST, ALT, ALKPHOS, BILITOT, PROT, ALBUMIN in the last 168 hours. No results for input(s): LIPASE, AMYLASE in the last 168 hours. No results for input(s): AMMONIA in the last 168 hours. Coagulation Profile: No results for input(s): INR, PROTIME in the last 168 hours. Cardiac Enzymes: Recent Labs  Lab 12/04/18 0753 12/04/18 1348 12/04/18 2017 12/05/18 0254  TROPONINI 0.03* 0.03* 0.03* 0.03*   BNP (last 3 results) No results for input(s): PROBNP in the last 8760 hours. HbA1C: No results for input(s): HGBA1C in the last 72 hours. CBG: Recent Labs  Lab 12/09/18 1204 12/09/18 1658 12/09/18 2127 12/10/18 0625 12/10/18 1149  GLUCAP 128* 158* 145* 196* 145*   Lipid Profile: No results for input(s): CHOL, HDL, LDLCALC, TRIG, CHOLHDL, LDLDIRECT in the last 72 hours. Thyroid Function Tests: No results for input(s): TSH, T4TOTAL, FREET4, T3FREE, THYROIDAB in the last 72 hours. Anemia Panel: No results for input(s): VITAMINB12, FOLATE, FERRITIN, TIBC, IRON, RETICCTPCT in the last 72 hours. Urine analysis:     Component Value Date/Time   COLORURINE YELLOW 11/17/2018 0717   APPEARANCEUR CLEAR 11/17/2018 0717   LABSPEC 1.023 11/17/2018 0717   PHURINE 5.0 11/17/2018 0717   GLUCOSEU >=500 (A) 11/17/2018 0717   HGBUR NEGATIVE 11/17/2018 0717   BILIRUBINUR NEGATIVE 11/17/2018 0717   KETONESUR NEGATIVE 11/17/2018 0717   PROTEINUR 100 (A) 11/17/2018 0717   UROBILINOGEN 1.0 03/14/2015 0610   NITRITE NEGATIVE 11/17/2018 0717   LEUKOCYTESUR NEGATIVE 11/17/2018 0717   Sepsis Labs: (procalcitonin:4,lacticidven:4) ) Recent Results (from the past 240 hour(s))  SARS Coronavirus 2 (CEPHEID - Performed in [facility] hospital lab), Hosp Order     Status: None   Collection Time: 12/02/18  4:45 AM   Specimen: Nasopharyngeal Swab  Result Value Ref Range Status   SARS Coronavirus 2 NEGATIVE NEGATIVE Final    Comment: (NOTE) If result is NEGATIVE SARS-CoV-2 target nucleic acids are NOT DETECTED. The SARS-CoV-2 RNA is generally detectable in upper and lower  respiratory specimens during the acute phase of infection. The lowest  concentration of SARS-CoV-2 viral copies this assay can detect is 250  copies / mL. A negative result does not preclude SARS-CoV-2 infection  and should not be used as the sole basis for treatment or other  patient management decisions.  A negative result may occur with  improper specimen collection / handling, submission of specimen other  than nasopharyngeal swab, presence of viral mutation(s) within the  areas targeted by this assay, and inadequate number of viral copies  (<250 copies / mL). A negative result must be combined with clinical  observations, patient history, and epidemiological information. If result is POSITIVE SARS-CoV-2 target nucleic acids are DETECTED. The SARS-CoV-2 RNA is generally detectable in upper and lower  respiratory specimens dur ing the acute phase of infection.  Positive  results are indicative of active infection with SARS-CoV-2.   Clinical  correlation with patient history and other diagnostic information is  necessary to determine patient infection status.  Positive results do  not rule out bacterial infection or co-infection with other viruses. If result is PRESUMPTIVE POSTIVE SARS-CoV-2 nucleic acids MAY BE PRESENT.   A presumptive positive result was obtained on the submitted specimen  and confirmed on repeat testing.  While  2019 novel coronavirus  (SARS-CoV-2) nucleic acids may be present in the submitted sample  additional confirmatory testing may be necessary for epidemiological  and / or clinical management purposes  to differentiate between  SARS-CoV-2 and other Sarbecovirus currently known to infect humans.  If clinically indicated additional testing with an alternate test  methodology [phone]) is advised. The SARS-CoV-2 RNA is generally  detectable in upper and lower respiratory sp ecimens during the acute  phase of infection. The expected result is Negative. Fact Sheet for Patients:  StrictlyIdeas.no Fact Sheet for Healthcare Providers: BankingDealers.co.za This test is not yet approved or cleared by the [place] FDA and has been authorized for detection and/or diagnosis of SARS-CoV-2 by FDA under an Emergency Use Authorization (EUA).  This EUA will remain in effect (meaning this test can be used) for the duration of the COVID-19 declaration under Section 564(b)(1) of the Act, 21 U.S.C. section 360bbb-3(b)(1), unless the authorization is terminated or revoked sooner. Performed at [facility], [place] [address], [place], [place] [address]   SARS Coronavirus 2     Status: None   Collection Time: 12/04/18 10:53 AM  Result Value Ref Range Status   SARS Coronavirus 2 NOT DETECTED NOT DETECTED Final    Comment: (NOTE) SARS-CoV-2 target nucleic acids are NOT DETECTED. The SARS-CoV-2 RNA is generally detectable in upper and lower  respiratory specimens during the acute phase of infection.  Negative  results do not preclude SARS-CoV-2 infection, do not rule out co-infections with other pathogens, and should not be used as the sole basis for treatment or other patient management decisions.  Negative results must be combined with clinical observations, patient history, and epidemiological information. The expected result is Not Detected. Fact Sheet for Patients: http://www.biofiredefense.com/wp-content/uploads/2020/03/BIOFIRE-COVID -19-patients.pdf Fact Sheet for Healthcare Providers: http://www.biofiredefense.com/wp-content/uploads/2020/03/BIOFIRE-COVID -19-hcp.pdf This test is not yet approved or cleared by the [place] and  has been authorized for detection and/or diagnosis of SARS-CoV-2 by FDA under an Emergency Use Authorization (EUA).  This EUA will remain in effec t (meaning this test can be used) for the duration of  the COVID-19 declaration under Section 564(b)(1) of the Act, 21 U.S.C. section 360bbb-3(b)(1), unless the authorization is terminated or revoked sooner. Performed at [facility], [place] [address], [place], [place] [address]   MRSA PCR Screening     Status: Abnormal   Collection Time: 12/07/18 12:01 PM   Specimen: Nasopharyngeal  Result Value Ref Range Status   MRSA by PCR POSITIVE (A) NEGATIVE Final    Comment:        The GeneXpert MRSA Assay (FDA approved for NASAL specimens only), is one component of a comprehensive MRSA colonization surveillance program. It is not intended to diagnose MRSA infection nor to guide or monitor treatment for MRSA infections. CRITICAL RESULT CALLED TO, READ BACK BY AND VERIFIED WITH: RN GRACE SISON [id number] [phone] FCP Performed at [place] [address], [place], [place] [address]   SARS Coronavirus 2     Status: None   Collection Time: 12/11/18 12:52 AM  Result Value Ref Range Status   SARS Coronavirus 2 NOT DETECTED NOT  DETECTED Final    Comment: (NOTE) SARS-CoV-2 target nucleic acids are NOT DETECTED. The SARS-CoV-2 RNA is generally detectable in upper and lower respiratory specimens during the acute phase of infection.  Negative  results do not preclude SARS-CoV-2 infection, do not rule out co-infections with other pathogens, and should not be used as the sole basis  for treatment or other patient management decisions.  Negative results must be combined with clinical observations, patient history, and epidemiological information. The expected result is Not Detected. Fact Sheet for Patients: http://www.biofiredefense.com/wp-content/uploads/2020/03/BIOFIRE-COVID -19-patients.pdf Fact Sheet for Healthcare Providers: http://www.biofiredefense.com/wp-content/uploads/2020/03/BIOFIRE-COVID -19-hcp.pdf This test is not yet approved or cleared by the [place] FDA and  has been authorized for detection and/or diagnosis of SARS-CoV-2 by FDA under an Emergency Use Authorization (EUA).  This EUA will remain in effec t (meaning this test can be used) for the duration of  the COVID-19 declaration under Section 564(b)(1) of the Act, 21 U.S.C. section 360bbb-3(b)(1), unless the authorization is terminated or revoked sooner. Performed at [facility], [address] [place], [place] [address]      Radiological Exams on Admission: Dg Chest Port 1 View  Result Date: 12/10/2018 CLINICAL DATA:  Shortness of breath EXAM: PORTABLE CHEST 1 VIEW COMPARISON:  12/04/2018 FINDINGS: There is cardiomegaly with findings of pulmonary edema. There are likely small bilateral pleural effusions. There is likely adjacent compressive atelectasis. There is no pneumothorax. No acute osseous abnormality. IMPRESSION: Cardiomegaly with pulmonary edema. There are persistent small bilateral pleural effusions. Electronically Signed   By: Katherine Mantlehristopher  Green M.D.   On: 12/10/2018 23:22     EKG: Not done in ED, will get one.    Assessment/Plan Principal Problem:   Acute on chronic systolic (congestive) heart failure (HCC) Active Problems:   Essential hypertension, benign   Cocaine use disorder, severe, dependence (HCC)   Chronic paranoid schizophrenia (HCC)   Tobacco use   Acute respiratory failure with hypoxia (HCC)   MDD (major depressive disorder), severe (HCC)   Diabetes mellitus without complication (HCC)   Abdominal pain   Nausea vomiting and diarrhea   Anemia   Acute respiratory failure with hypoxia due to acute on chronic systolic CHF: Patient has elevated BNP 949, positive JVD and bilateral leg edema.  Chest x-ray showed pulmonary edema, consistent with CHF exacerbation.  Patient had 2D echo on 11/07/2018 which showed EF of 25-30%.  Will not repeat 2D echo today. Pt is homeless. He does not seem to be able to take care of himself medically.  Will need placement.  -will place on tele bed for obs -will admit to tele bed as inpt. -Lasix 40 mg bid by IV (pt received 80 mg Lasix in ED) -Daily weights -strict I/O's -Low salt diet -Fluid restriction -will consult CM and SW for SNF placement  HTN:  -Continue home medications: Coreg, lisinopril -Patient is on IV Lasix -IV hydralazine prn  Cocaine use disorder, severe, dependence and  Tobacco use: -Did counseling about importance of quitting substance use - Nicotine patch  Chronic paranoid schizophrenia and MDD (major depressive disorder), severe (HCC): No SI or HI. -continue home Abilify and Tegretol  Diabetes mellitus without complication (HCC): Last A1c 9.7 on 12/04/18, poorly controled. Patient is taking metformin at home -SSI  Abdominal pain, Nausea vomiting and diarrhea: Patient has minimal diffused abdominal pain on examination.  Patient may have viral gastroenteritis.  Electrolytes okay. -Supportive care -PRN Zofran for nausea  Anemia: Hgb stable, 9.4 on 12/08/18-->9.4 -f/u by CBC   DVT ppx: SQ Lovenox Code Status: Full code (I discussed  with the patient about CODE STATUS, and explained the meaning of CODE STATUS, he wants to be full code today) Family Communication: None at bed side.   Disposition Plan:  Anticipate discharge SNF Consults called:  none Admission status: Obs / tele    Date of Service  12/11/2018    Lorretta HarpXilin Manas Hickling [facility]   If 7PM-7AM, please contact night-coverage [email or web address] Password [email or web address] 12/11/2018, 4:26 AM

## 2018-12-11 NOTE — Progress Notes (Signed)
Patient with extensive medical problems, drug use, congestive heart failure went home yesterday evening with a wheelchair ordered from the hospital, came back at night to the ER stating that he is short of breath. Admitted earlier by nighttime hospitalist.  H&P reviewed.  Discussed with patient.  He tells me repeatedly that he needs FL 2 and needs to go to a rest home.  I think is probably referring to a skilled nursing facility. Plan: Patient is fairly compensated for his congestive heart failure, will continue all his heart failure regimen.  Had one episode of bradycardia less than 40, decrease dose of Coreg.  On oral Lasix. Social worker consulted for placement.

## 2018-12-11 NOTE — Progress Notes (Signed)
Notified by CCMD that HR dropped to 36 bpm- pt asymptomatic and will hand this information to day shift RN

## 2018-12-11 NOTE — Progress Notes (Signed)
Inpatient Diabetes Program Recommendations  AACE/ADA: New Consensus Statement on Inpatient Glycemic Control (2015)  Target Ranges:  Prepandial:   less than 140 mg/dL      Peak postprandial:   less than 180 mg/dL (1-2 hours)      Critically ill patients:  140 - 180 mg/dL   Lab Results  Component Value Date   GLUCAP 332 (H) 12/11/2018   HGBA1C 9.7 (H) 12/04/2018    Review of Glycemic Control  Diabetes history: Type 2 Outpatient Diabetes medications: Metformin Current orders for Inpatient glycemic control: Novolog SENSITIVE correction scale TID & HS  Inpatient Diabetes Program Recommendations:    On last admission, patient was on Lantus 15 units daily, Novolog SENSITIVE correction scale TID & HS, and Novolog 4 units TID. If blood sugars continue to be greater than 180 mg/dl, recommend adding Lantus 15 units daily while in the hospital.  Harvel Ricks RN BSN CDE Diabetes Coordinator Pager: [phone]  8am-5pm

## 2018-12-11 NOTE — Progress Notes (Signed)
Physical Therapy Evaluation Patient Details Name: [name] MRN: [id number] DOB: Jan 31, 1961 Today's Date: 12/11/2018   History of Present Illness  [name] is a 58 y.o. male with medical history significant of homelessness, hypertension, hyperlipidemia, diabetes mellitus, polysubstance abuse, tobacco abuse, cocaine abuse, depression, anxiety, HCV, [name] with EF of 25-30%, schizophrenia, AAA, who presents with shortness of breath after recent d/c 6/11.  Clinical Impression  Pt admitted with/for SOB, from CHF after recent d/c.  Pt needing min guard assist presently.  He is mildly dyspneic and is on 3 L O2..  Pt currently limited functionally due to the problems listed. ( See problems list.)   Pt will benefit from PT to maximize function and safety in order to get ready for next venue listed below.     Follow Up Recommendations SNF    Equipment Recommendations  Rolling walker with 5" wheels    Recommendations for Other Services       Precautions / Restrictions Precautions Precautions: Fall Precaution Comments: states himself he gets angry and goes AMA all the time. Restrictions Weight Bearing Restrictions: No      Mobility  Bed Mobility Overal bed mobility: Needs Assistance Bed Mobility: Supine to Sit;Sit to Supine     Supine to sit: Min guard;HOB elevated Sit to supine: Min guard   General bed mobility comments: increased time/effore, HOB elevated, but pt still able to reposition himself once or twice before he gets tired and out of breath.  Transfers Overall transfer level: Needs assistance Equipment used: Rolling walker (2 wheeled) Transfers: Sit to/from Stand Sit to Stand: Min guard         General transfer comment: cues for hand placement and safety  Ambulation/Gait Ambulation/Gait assistance: Min guard Gait Distance (Feet): 140 Feet Assistive device: Rolling walker (2 wheeled) Gait Pattern/deviations: Step-through pattern Gait velocity:  slower Gait velocity interpretation: 1.31 - 2.62 ft/sec, indicative of limited community ambulator General Gait Details: wide BOS with short, waddling steps due to perineal/scotal edema.  Stairs            Wheelchair Mobility    Modified Rankin (Stroke Patients Only)       Balance Overall balance assessment: Needs assistance   Sitting balance-Leahy Scale: Good     Standing balance support: Bilateral upper extremity supported Standing balance-Leahy Scale: Poor Standing balance comment: reliant on the RW for dynamic mobility                             Pertinent Vitals/Pain Pain Assessment: Faces Faces Pain Scale: Hurts whole lot Pain Location: legs and trunk tight and sore. Pain Descriptors / Indicators: Pressure;Tightness;Throbbing Pain Intervention(s): Monitored during session    Home Living Family/patient expects to be discharged to:: Shelter/Homeless Living Arrangements: Other (Comment)(pt has a friend he occ. stays with, but not a permanent opt.)               Additional Comments: pt states when things get bad he calls to ambulance    Prior Function Level of Independence: Independent               Hand Dominance   Dominant Hand: Right    Extremity/Trunk Assessment   Upper Extremity Assessment Upper Extremity Assessment: Overall WFL for tasks assessed    Lower Extremity Assessment Lower Extremity Assessment: RLE deficits/detail;LLE deficits/detail RLE Deficits / Details: restricted mobility due to edema.  Strength assess to be >3/5 RLE Coordination: decreased fine motor  LLE Deficits / Details: overall swelling in the LE and truck limited mobility.  strength at >3/5. LLE Coordination: decreased fine motor    Cervical / Trunk Assessment Cervical / Trunk Assessment: Normal  Communication   Communication: No difficulties  Cognition Arousal/Alertness: Awake/alert Behavior During Therapy: Restless;WFL for tasks  assessed/performed Overall Cognitive Status: History of cognitive impairments - at baseline                             Awareness: Intellectual          General Comments General comments (skin integrity, edema, etc.): pt able to maintain sats in the lower 90's on 3L [place], with notable dyspnea.  HR maintained in the 100's.   Pt's trunk to his feet bil are extremely firm and tight.    Exercises     Assessment/Plan    PT Assessment Patient needs continued PT services  PT Problem List Decreased strength;Decreased range of motion;Decreased coordination;Decreased cognition;Decreased balance;Decreased mobility;Decreased activity tolerance;Decreased knowledge of use of DME;Decreased safety awareness;Cardiopulmonary status limiting activity;Obesity       PT Treatment Interventions DME instruction;Gait training;Stair training;Functional mobility training;Balance training;Patient/family education    PT Goals (Current goals can be found in the Care Plan section)  Acute Rehab PT Goals Patient Stated Goal: Get a place to stay. PT Goal Formulation: With patient Time For Goal Achievement: 12/25/18 Potential to Achieve Goals: Fair    Frequency Min 2X/week   Barriers to discharge Decreased caregiver support      Co-evaluation               AM-PAC PT "6 Clicks" Mobility  Outcome Measure Help needed turning from your back to your side while in a flat bed without using bedrails?: A Little Help needed moving from lying on your back to sitting on the side of a flat bed without using bedrails?: A Little Help needed moving to and from a bed to a chair (including a wheelchair)?: A Little Help needed standing up from a chair using your arms (e.g., wheelchair or bedside chair)?: A Little Help needed to walk in hospital room?: A Little Help needed climbing 3-5 steps with a railing? : A Lot 6 Click Score: 17    End of Session   Activity Tolerance: Patient tolerated treatment  well Patient left: in bed;with call bell/phone within reach Nurse Communication: Mobility status PT Visit Diagnosis: Unsteadiness on feet (R26.81);Other abnormalities of gait and mobility (R26.89);Difficulty in walking, not elsewhere classified (R26.2);Pain Pain - part of body: (legs/trunk)    Time: [id number] PT Time Calculation (min) (ACUTE ONLY): 20 min   Charges:   PT Evaluation $PT Eval Moderate Complexity: 1 Mod          12/11/2018  Sumner BingKen Quinesha Selinger, PT Acute Rehabilitation Services [phone]  (pager) [phone]  (office)  Eliseo GumKenneth V Jazari Ober 12/11/2018, 5:16 PM

## 2018-12-12 DIAGNOSIS — Z7984 Long term (current) use of oral hypoglycemic drugs: Secondary | ICD-10-CM | POA: Diagnosis not present

## 2018-12-12 DIAGNOSIS — J9601 Acute respiratory failure with hypoxia: Secondary | ICD-10-CM | POA: Diagnosis present

## 2018-12-12 DIAGNOSIS — F17210 Nicotine dependence, cigarettes, uncomplicated: Secondary | ICD-10-CM | POA: Diagnosis present

## 2018-12-12 DIAGNOSIS — F200 Paranoid schizophrenia: Secondary | ICD-10-CM | POA: Diagnosis present

## 2018-12-12 DIAGNOSIS — F1420 Cocaine dependence, uncomplicated: Secondary | ICD-10-CM | POA: Diagnosis present

## 2018-12-12 DIAGNOSIS — D638 Anemia in other chronic diseases classified elsewhere: Secondary | ICD-10-CM | POA: Diagnosis present

## 2018-12-12 DIAGNOSIS — R0602 Shortness of breath: Secondary | ICD-10-CM | POA: Diagnosis present

## 2018-12-12 DIAGNOSIS — Z79899 Other long term (current) drug therapy: Secondary | ICD-10-CM | POA: Diagnosis not present

## 2018-12-12 DIAGNOSIS — F322 Major depressive disorder, single episode, severe without psychotic features: Secondary | ICD-10-CM | POA: Diagnosis present

## 2018-12-12 DIAGNOSIS — I5023 Acute on chronic systolic (congestive) heart failure: Secondary | ICD-10-CM | POA: Diagnosis not present

## 2018-12-12 DIAGNOSIS — I110 Hypertensive heart disease with heart failure: Secondary | ICD-10-CM | POA: Diagnosis present

## 2018-12-12 DIAGNOSIS — Z590 Homelessness: Secondary | ICD-10-CM | POA: Diagnosis not present

## 2018-12-12 DIAGNOSIS — E1140 Type 2 diabetes mellitus with diabetic neuropathy, unspecified: Secondary | ICD-10-CM | POA: Diagnosis present

## 2018-12-12 DIAGNOSIS — Z8249 Family history of ischemic heart disease and other diseases of the circulatory system: Secondary | ICD-10-CM | POA: Diagnosis not present

## 2018-12-12 DIAGNOSIS — Z1159 Encounter for screening for other viral diseases: Secondary | ICD-10-CM | POA: Diagnosis not present

## 2018-12-12 DIAGNOSIS — E785 Hyperlipidemia, unspecified: Secondary | ICD-10-CM | POA: Diagnosis present

## 2018-12-12 DIAGNOSIS — Z833 Family history of diabetes mellitus: Secondary | ICD-10-CM | POA: Diagnosis not present

## 2018-12-12 LAB — BASIC METABOLIC PANEL
Anion gap: 10 (ref 5–15)
BUN: 22 mg/dL — ABNORMAL HIGH (ref 6–20)
CO2: 32 mmol/L (ref 22–32)
Calcium: 8.9 mg/dL (ref 8.9–10.3)
Chloride: 97 mmol/L — ABNORMAL LOW (ref 98–111)
Creatinine, Ser: 0.93 mg/dL (ref 0.61–1.24)
GFR calc Af Amer: >60 mL/min (ref >60)
GFR calc non Af Amer: >60 mL/min (ref >60)
Glucose, Bld: 142 mg/dL — ABNORMAL HIGH (ref 70–99)
Potassium: 3.8 mmol/L (ref 3.5–5.1)
Sodium: 139 mmol/L (ref 135–145)

## 2018-12-12 LAB — GLUCOSE, CAPILLARY
Glucose-Capillary: 143 mg/dL — ABNORMAL HIGH (ref 70–99)
Glucose-Capillary: 231 mg/dL — ABNORMAL HIGH (ref 70–99)
Glucose-Capillary: 198 mg/dL — ABNORMAL HIGH (ref 70–99)
Glucose-Capillary: 205 mg/dL — ABNORMAL HIGH (ref 70–99)

## 2018-12-12 MED ORDER — POLYETHYLENE GLYCOL 3350 17 G PO PACK
17.0000 g | PACK | Freq: Once | ORAL | Status: AC
  Administered 2018-12-12: 17 g via ORAL
  Filled 2018-12-12: qty 1

## 2018-12-12 MED ORDER — ALBUTEROL SULFATE (2.5 MG/3ML) 0.083% IN NEBU: 2.5000 mg | INHALATION_SOLUTION | RESPIRATORY_TRACT | Status: DC | PRN

## 2018-12-12 NOTE — Plan of Care (Signed)
Tried to instruct patient on why he isn't able to breathe and why his abdomen is so big and that he can't have a lot of fluids because we're trying to diurese him but he doesn't want to listen. He wants something for his bowels and to have a condom catheter placed for the night so that he can sleep, will call [facility] and let them know of my concerns and continue to monitor.

## 2018-12-12 NOTE — [place] FL2 (Deleted)
MEDICAID FL2 LEVEL OF CARE SCREENING TOOL     IDENTIFICATION  Patient Name: [name] Birthdate: 29-Dec-1960 Sex: male Admission Date (Current Location): 12/10/2018  [facility] [place]   Facility and Address:  [facility], [address], [place], [place] [address]      Provider Number: [id number]  Attending Physician Name and Address:  Alwyn RenMathews, Elizabeth G, MD  Relative Name and Phone Number:       Current Level of Care: Hospital Recommended Level of Care: Skilled Nursing Facility Prior Approval Number: under  manual review will need f/u  Date Approved/Denied:   PASRR Number:    Discharge Plan: SNF    Current Diagnoses: Patient Active Problem List   Diagnosis Date Noted  . Diabetes mellitus without complication (HCC) 12/11/2018  . Acute on chronic systolic (congestive) heart failure (HCC) 12/11/2018  . Abdominal pain 12/11/2018  . Nausea vomiting and diarrhea 12/11/2018  . Anemia 12/11/2018  . Evaluation by psychiatric service required   . MDD (major depressive disorder), severe (HCC) 11/25/2018  . Pressure injury of skin 11/08/2018  . Elevated troponin 10/18/2018  . HLD (hyperlipidemia) 10/18/2018  . Anxiety 10/18/2018  . CHF (congestive heart failure), NYHA class II, acute on chronic, combined (HCC) 10/18/2018  . Hypertensive urgency 10/12/2018  . Mild renal insufficiency 10/12/2018  . Cellulitis 10/12/2018  . Penile cellulitis   . Adjustment disorder with mixed disturbance of emotions and conduct   . Sleep apnea 10/03/2018  . Hypokalemia 09/17/2018  . Scrotal swelling 09/17/2018  . Urinary hesitancy   . Constipation   . Acute on chronic combined systolic and diastolic CHF (congestive heart failure) (HCC) 08/25/2018  . Acute respiratory failure with hypoxia (HCC) 08/25/2018  . Tobacco use 08/18/2018  . Homelessness 08/08/2018  . Smoker 08/08/2018  . Prostate enlargement 03/16/2018  . Aortic  atherosclerosis (HCC) 03/16/2018  . 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 Chronic foot pain   . Schizoaffective disorder, bipolar type (HCC) 09/30/2016  . Substance induced mood disorder (HCC) 03/13/2015  . Schizophrenia, paranoid type (HCC) 01/17/2015  . Drug hallucinosis (HCC) 10/08/2014  . Chronic paranoid schizophrenia (HCC) 09/07/2014  . 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 Urinary retention   . Cocaine use disorder, severe, dependence (HCC)   . Essential hypertension, benign 03/28/2013  . Insulin-requiring or dependent type II diabetes mellitus (HCC) 03/15/2013    Orientation RESPIRATION BLADDER Height & Weight     Self, Time, Situation, Place  O2, Other (Comment)(3L) Incontinent, External catheter Weight: 248 lb 10.9 oz (112.8 kg) Height:  5\' 9"  (175.3 cm)  BEHAVIORAL SYMPTOMS/MOOD NEUROLOGICAL BOWEL NUTRITION STATUS      Incontinent Diet(Carb modified)  AMBULATORY STATUS COMMUNICATION OF NEEDS Skin   Limited Assist Verbally Other (Comment)(dry skin on heel and hip)                       Personal Care Assistance Level of Assistance    Bathing Assistance: Limited assistance Feeding assistance: Independent Dressing Assistance: Limited assistance Total Care Assistance: Limited assistance   Functional Limitations Info  Sight, Hearing, Speech Sight Info: Adequate Hearing Info: Adequate Speech Info: Adequate    SPECIAL CARE FACTORS FREQUENCY  PT (By licensed PT), OT (By licensed OT)     PT Frequency: 5x per week OT Frequency: 5x per week  Contractures Contractures Info: Not present    Additional Factors Info  Isolation Precautions, Allergies, Code Status Code Status Info: Full Allergies Info: Haldol Haloperidol   Insulin Sliding Scale Info: (novolog 0-5 units at bedtime, novolog 0-9 units 3x per day with meals, Lantus 15 units at bedtime) Isolation Precautions  Info: MRSA     Current Medications (12/12/2018):  This is the current hospital active medication list Current Facility-Administered Medications  Medication Dose Route Frequency Provider Last Rate Last Dose  . 0.9 %  sodium chloride infusion  250 mL Intravenous PRN Niu, Xilin, MD      . acetaminophen (TYLENOL) tablet 650 mg  650 mg Oral Q6H PRN Niu, Xilin, MD      . acetaminophen (TYLENOL) tablet 650 mg  650 mg Oral Q4H PRN Niu, Xilin, MD   650 mg at 12/12/18 1128  . albuterol (PROVENTIL) (2.5 MG/3ML) 0.083% nebulizer solution 2.5 mg  2.5 mg Nebulization Q4H PRN Ghimire, Kuber, MD      . ARIPiprazole (ABILIFY) tablet 10 mg  10 mg Oral Daily Niu, Xilin, MD   10 mg at 12/12/18 0858  . aspirin EC tablet 81 mg  81 mg Oral Daily Niu, Xilin, MD   81 mg at 12/12/18 0858  . carbamazepine (TEGRETOL) tablet 100 mg  100 mg Oral BID Niu, Xilin, MD   100 mg at 12/12/18 0948  . carvedilol (COREG) tablet 3.125 mg  3.125 mg Oral BID WC Ghimire, Kuber, MD   3.125 mg at 12/12/18 0858  . dextromethorphan-guaiFENesin (MUCINEX DM) 30-600 MG per 12 hr tablet 1 tablet  1 tablet Oral BID PRN Niu, Xilin, MD      . enoxaparin (LOVENOX) injection 40 mg  40 mg Subcutaneous Q24H Niu, Xilin, MD   40 mg at 12/12/18 0644  . furosemide (LASIX) tablet 40 mg  40 mg Oral BID Niu, Xilin, MD   40 mg at 12/12/18 0858  . hydrALAZINE (APRESOLINE) injection 5 mg  5 mg Intravenous Q2H PRN Niu, Xilin, MD      . hydrocortisone (ANUSOL-HC) suppository 25 mg  25 mg Rectal BID Ghimire, Kuber, MD      . insulin aspart (novoLOG) injection 0-5 Units  0-5 Units Subcutaneous QHS Niu, Xilin, MD   2 Units at 12/11/18 2129  . insulin aspart (novoLOG) injection 0-9 Units  0-9 Units Subcutaneous TID WC Niu, Xilin, MD   3 Units at 12/12/18 1235  . insulin glargine (LANTUS) injection 15 Units  15 Units Subcutaneous QHS Ghimire, Kuber, MD   15 Units at 12/11/18 2128  . lisinopril (ZESTRIL) tablet 10 mg  10 mg Oral Daily Niu, Xilin, MD   10 mg at 12/12/18  0859  . nicotine (NICODERM CQ - dosed in mg/24 hours) patch 21 mg  21 mg Transdermal Daily Niu, Xilin, MD   21 mg at 12/12/18 0859  . ondansetron (ZOFRAN) injection 4 mg  4 mg Intravenous Q6H PRN Niu, Xilin, MD      . potassium chloride SA (K-DUR) CR tablet 20 mEq  20 mEq Oral BID Niu, Xilin, MD   20 mEq at 12/12/18 0858  . sodium chloride flush (NS) 0.9 % injection 3 mL  3 mL Intravenous Q12H Niu, Xilin, MD   3 mL at 12/11/18 2130  . sodium chloride flush (NS) 0.9 % injection 3 mL  3 mL Intravenous PRN Niu, Xilin, MD         Discharge Medications: Please see discharge summary for a list of discharge medications.    Relevant Imaging Results:  Relevant Lab Results:   Additional Information [phone]  Bary Castilla, LCSW

## 2018-12-12 NOTE — Progress Notes (Signed)
Occupational Therapy Evaluation Patient Details Name: [name] MRN: [id number] DOB: 10-10-1960 Today's Date: 12/12/2018    History of Present Illness [name] is a [age] y.o. male with medical history significant of homelessness, hypertension, hyperlipidemia, diabetes mellitus, polysubstance abuse, tobacco abuse, cocaine abuse, depression, anxiety, HCV, [name] with EF of 25-30%, schizophrenia, AAA, who presents with shortness of breath after recent d/c 6/11.   Clinical Impression   PTA, pt reports he is homeless and has been living with a friend but it is not a permanent situation. Pt reports he was independent with ADL/IADL and functional mobility but has been having increased difficulty with walking and LB dressing. Pt currently requires minA for LB dressing, setupA for grooming while seated, and minguard for functional mobiltiy. Pt on 3lnc SpO2 >93% throughout session, DoE 3/4, RR mid-upper 20s. Began education on energy conservation strategies to implement during ADL/IADL completion. Due to decline in current level of function, pt would benefit from acute OT to address established goals to facilitate safe D/C to venue listed below. At this time, recommend SNF follow-up. Will continue to follow acutely.     Follow Up Recommendations  SNF;Supervision/Assistance - 24 hour    Equipment Recommendations  None recommended by OT    Recommendations for Other Services       Precautions / Restrictions Precautions Precautions: Fall Precaution Comments: per chart, pt states he gets angry and goes AMA all the time. Restrictions Weight Bearing Restrictions: No      Mobility Bed Mobility Overal bed mobility: Needs Assistance Bed Mobility: Sit to Supine       Sit to supine: Min guard   General bed mobility comments: increased time/effore, HOB elevated, but pt still able to reposition himself once or twice before he gets tired and out of breath.  Transfers Overall  transfer level: Needs assistance Equipment used: Rolling walker (2 wheeled) Transfers: Sit to/from Stand Sit to Stand: Min guard         General transfer comment: cues for hand placement and safety    Balance Overall balance assessment: Needs assistance Sitting-balance support: Feet supported Sitting balance-Leahy Scale: Good     Standing balance support: Bilateral upper extremity supported Standing balance-Leahy Scale: Poor Standing balance comment: reliant on the RW for dynamic mobility                           ADL either performed or assessed with clinical judgement   ADL Overall ADL's : Needs assistance/impaired Eating/Feeding: Independent   Grooming: Min guard;Standing;Wash/dry face;Wash/dry hands   Upper Body Bathing: Min guard;Sitting   Lower Body Bathing: Minimal assistance;Sit to/from stand   Upper Body Dressing : Set up;Sitting   Lower Body Dressing: Minimal assistance;Sit to/from stand Lower Body Dressing Details (indicate cue type and reason): minA to don socks/shoes/don pants over feet Toilet Transfer: Min guard;Ambulation Toilet Transfer Details (indicate cue type and reason): declines using RW Toileting- Clothing Manipulation and Hygiene: Min guard;Sit to/from stand       Functional mobility during ADLs: Min guard;Rolling walker General ADL Comments: pt fatigues easily;began education on energy conservation strategies to implement during ADL;     Vision Baseline Vision/History: No visual deficits Patient Visual Report: No change from baseline       Perception     Praxis      Pertinent Vitals/Pain Pain Assessment: 0-10 Faces Pain Scale: Hurts whole lot Pain Location: legs and trunk tight and sore. Pain Descriptors / Indicators:  Pressure;Tightness;Throbbing Pain Intervention(s): Monitored during session;Limited activity within patient's tolerance     Hand Dominance Right   Extremity/Trunk Assessment Upper Extremity  Assessment Upper Extremity Assessment: Overall WFL for tasks assessed   Lower Extremity Assessment Lower Extremity Assessment: Defer to PT evaluation RLE Deficits / Details: noted swelling, pt reports no change LLE Deficits / Details: noted swelling, pt reports no change   Cervical / Trunk Assessment Cervical / Trunk Assessment: Normal   Communication Communication Communication: No difficulties   Cognition Arousal/Alertness: Awake/alert Behavior During Therapy: Restless;WFL for tasks assessed/performed Overall Cognitive Status: History of cognitive impairments - at baseline Area of Impairment: Memory;Problem solving;Safety/judgement;Attention                 Orientation Level: Time Current Attention Level: Selective   Following Commands: Follows one step commands consistently;Follows one step commands with increased time Safety/Judgement: Decreased awareness of safety;Decreased awareness of deficits Awareness: Intellectual Problem Solving: Slow processing;Difficulty sequencing;Requires verbal cues;Requires tactile cues General Comments: pt with increased awareness of deficits    General Comments  pt able to maintain sats >93 on 3lnc; DoE 3/4;RR mid to upper 20s, vc for slow controlled RR;HR 90s    Exercises     Shoulder Instructions      Home Living Family/patient expects to be discharged to:: Shelter/Homeless Living Arrangements: Other (Comment)(pt has a friend he occ. stays with, but not a permanent opt.)                               Additional Comments: pt states when things get bad he calls to ambulance      Prior Functioning/Environment Level of Independence: Independent                 OT Problem List: Decreased activity tolerance;Impaired balance (sitting and/or standing);Decreased safety awareness;Decreased knowledge of use of DME or AE;Cardiopulmonary status limiting activity;Obesity;Pain;Increased edema      OT  Treatment/Interventions: Self-care/ADL training;Energy conservation;DME and/or AE instruction;Therapeutic activities;Patient/family education;Balance training    OT Goals(Current goals can be found in the care plan section) Acute Rehab OT Goals Patient Stated Goal: Get a place to stay. OT Goal Formulation: With patient Time For Goal Achievement: 12/03/18 Potential to Achieve Goals: Fair ADL Goals Pt Will Perform Grooming: with modified independence;standing Pt Will Perform Upper Body Dressing: with modified independence Pt Will Perform Lower Body Dressing: with modified independence;sit to/from stand Pt Will Transfer to Toilet: with modified independence;ambulating Additional ADL Goal #1: Pt will demonstrate 3 energy conservation strategies.  OT Frequency: Min 2X/week   Barriers to D/C: Inaccessible home environment;Decreased caregiver support          Co-evaluation              AM-PAC OT "6 Clicks" Daily Activity     Outcome Measure Help from another person eating meals?: None Help from another person taking care of personal grooming?: A Little Help from another person toileting, which includes using toliet, bedpan, or urinal?: A Little Help from another person bathing (including washing, rinsing, drying)?: A Little Help from another person to put on and taking off regular upper body clothing?: A Little Help from another person to put on and taking off regular lower body clothing?: A Little 6 Click Score: 19   End of Session Equipment Utilized During Treatment: Gait belt;Rolling walker;Oxygen Nurse Communication: Mobility status;Other (comment)  Activity Tolerance: Patient tolerated treatment well Patient left: in bed;with call bell/phone within reach;with  bed alarm set(EOB)  OT Visit Diagnosis: Unsteadiness on feet (R26.81);Other abnormalities of gait and mobility (R26.89);Adult, failure to thrive (R62.7);Pain;Muscle weakness (generalized) (M62.81);History of falling  (Z91.81) Pain - Right/Left: (BLE) Pain - part of body: Leg                Time: 4462-8638 OT Time Calculation (min): 21 min Charges:  OT General Charges $OT Visit: 1 Visit OT Evaluation $OT Eval Moderate Complexity: [place] OTR/L Acute Rehabilitation Services Office: [place] 12/12/2018, 12:27 PM

## 2018-12-12 NOTE — Progress Notes (Addendum)
PROGRESS NOTE    Mellody MemosFrederick L Kraszewski  ZOX:[id number] DOB: 31-Jul-1960 DOA: 12/10/2018 PCP: Patient, No Pcp Per   Brief Narrative: 58 y.o. male with medical history significant of homelessness, hypertension, hyperlipidemia, diabetes mellitus, polysubstance abuse, tobacco abuse, cocaine abuse, depression, anxiety, HCV, [name] with EF of 25-30%, schizophrenia, AAA, who presents with shortness of breath.  Patient was recently hospitalized from 6/5-6/11 due to CHF exacerbation.  Patient was just discharged yesterday. It appears SNF was recommended however it was not pursued since the patient has a hx of leaving AMA and has outbursts at staff. Pt was provided with wheelchair upon discharge, but it was left on the side of the road. He states that he has a worsening shortness of breath and a mild dry cough.  Denies chest pain, fever or chills.  He has bilateral leg edema.  He also reports intermittent nausea, vomiting, diarrhea and abdominal pain.  He states that he had approximately 8 times of loose stool bowel movement and vomited once.  His abdominal pain is diffuse, moderate, constant nonradiating.  Denies symptoms of UTI or unilateral weakness. Of note, previous admission, psych evaluated pt and they determined he has capacity to make his own decisions.  ED Course: pt was found to have BNP 949.9, pending COVID-19 test, creatinine 1.03, BUN 29, temperature normal, tachycardia, oxygen desaturated to 86% on room air, which improved to 94% on 3 L nasal cannula oxygen.  Chest x-ray showed pulmonary edema and cardiomegaly.  Patient is placed on telemetry bed for observation.   Assessment & Plan:   Principal Problem:   Acute on chronic systolic (congestive) heart failure (HCC) Active Problems:   Essential hypertension, benign   Cocaine use disorder, severe, dependence (HCC)   Chronic paranoid schizophrenia (HCC)   Tobacco use   Acute respiratory failure with hypoxia (HCC)   MDD (major depressive  disorder), severe (HCC)   Diabetes mellitus without complication (HCC)   Abdominal pain   Nausea vomiting and diarrhea   Anemia  Acute respiratory failure with hypoxia due to acute on chronic systolic CHF: Patient has elevated BNP 949, positive JVD and bilateral leg edema.  Chest x-ray showed pulmonary edema, consistent with CHF exacerbation.  Patient had 2D echo on 11/07/2018 which showed EF of 25-30%.  Will not repeat 2D echo today. Pt is homeless. He does not seem to be able to take care of himself medically.  Will need placement.  Change p.o. to IV Lasix 40 twice daily.  He is only negative by 500 cc.  HTN:  -Continue home medications: Coreg, lisinopril -Patient is on IV Lasix -IV hydralazine prn  Cocaine use disorder, severe, dependence and  Tobacco use: -Did counseling about importance of quitting substance use - Nicotine patch  Chronic paranoid schizophrenia and MDD (major depressive disorder), severe (HCC): No SI or HI. -continue home Abilify and Tegretol  Diabetes mellitus without complication (HCC): Last A1c 9.7 on 12/04/18, poorly controled. Patient is taking metformin at home -SSI  Abdominal pain, Nausea vomiting and diarrhea: Patient has minimal diffused abdominal pain on examination.  Patient may have viral gastroenteritis.  Electrolytes okay. -Supportive care -PRN Zofran for nausea  Anemia: Hgb stable, 9.4 on 12/08/18-->9.4 -f/u by CBC   DVT ppx: SQ Lovenox Code Status: Full code (I discussed with the patient about CODE STATUS, and explained the meaning of CODE STATUS, he wants to be full code today) Family Communication: None at bed side.   Disposition Plan:  Anticipate discharge SNF Consults called:  none  Estimated body mass index is 36.72 kg/m as calculated from the following:   Height as of this encounter: 5\' 9"  (1.753 m).   Weight as of this encounter: 112.8 kg.    Subjective: He reports his belly is bigger and legs are bigger continues to be  short of breath  Objective: Vitals:   12/11/18 2013 12/11/18 2023 12/12/18 0351 12/12/18 0859  BP: (!) 147/88  139/89 (!) 149/90  Pulse: 88  90   Resp: 17  16   Temp: 98.1 F (36.7 C)  (!) 97.4 F (36.3 C)   TempSrc: Oral  Oral   SpO2: 96% 96% 98%   Weight:      Height:        Intake/Output Summary (Last 24 hours) at 12/12/2018 1125 Last data filed at 12/11/2018 2345 Gross per 24 hour  Intake 840 ml  Output 700 ml  Net 140 ml   Filed Weights   12/11/18 0357  Weight: 112.8 kg    Examination:  General exam: Appears calm and comfortable  Respiratory system- diminished breath sounds at the bases to auscultation. Respiratory effort normal. Cardiovascular system: S1 & S2 heard, RRR. No JVD, murmurs, rubs, gallops or clicks. No pedal edema. Gastrointestinal system: Abdomen is nondistended, soft and nontender. No organomegaly or masses felt. Normal bowel sounds heard. Central nervous system: Alert and oriented. No focal neurological deficits. Extremities: 2+ pitting edema  skin: No rashes, lesions or ulcers Psychiatry: Judgement and insight appear normal. Mood & affect appropriate.     Data Reviewed: I have personally reviewed following labs and imaging studies  CBC: Recent Labs  Lab 12/08/18 0434 12/10/18 0343 12/10/18 2205  WBC 7.5 6.7 9.6  HGB 9.4* 9.0* 9.4*  HCT 31.5* 30.4* 32.1*  MCV 78.9* 79.2* 79.5*  PLT 275 234 241   Basic Metabolic Panel: Recent Labs  Lab 12/07/18 0538 12/08/18 0434 12/10/18 0343 12/10/18 2205 12/12/18 0352  NA 140 141 141 141 139  K 3.4* 3.5 3.9 3.8 3.8  CL 98 99 99 99 97*  CO2 34* 34* 34* 33* 32  GLUCOSE 200* 202* 229* 182* 142*  BUN 25* 25* 25* 27* 22*  CREATININE 1.21 1.11 1.03 1.03 0.93  CALCIUM 8.7* 8.9 8.7* 8.8* 8.9  MG  --  1.9  --   --   --    GFR: Estimated Creatinine Clearance: 108.5 mL/min (by C-G formula based on SCr of 0.93 mg/dL). Liver Function Tests: No results for input(s): AST, ALT, ALKPHOS, BILITOT, PROT,  ALBUMIN in the last 168 hours. Recent Labs  Lab 12/11/18 0407  LIPASE 25   No results for input(s): AMMONIA in the last 168 hours. Coagulation Profile: No results for input(s): INR, PROTIME in the last 168 hours. Cardiac Enzymes: No results for input(s): CKTOTAL, CKMB, CKMBINDEX, TROPONINI in the last 168 hours. BNP (last 3 results) No results for input(s): PROBNP in the last 8760 hours. HbA1C: No results for input(s): HGBA1C in the last 72 hours. CBG: Recent Labs  Lab 12/11/18 0738 12/11/18 1129 12/11/18 1625 12/11/18 2054 12/12/18 0735  GLUCAP 234* 332* 198* 249* 143*   Lipid Profile: No results for input(s): CHOL, HDL, LDLCALC, TRIG, CHOLHDL, LDLDIRECT in the last 72 hours. Thyroid Function Tests: No results for input(s): TSH, T4TOTAL, FREET4, T3FREE, THYROIDAB in the last 72 hours. Anemia Panel: No results for input(s): VITAMINB12, FOLATE, FERRITIN, TIBC, IRON, RETICCTPCT in the last 72 hours. Sepsis Labs: No results for input(s): PROCALCITON, LATICACIDVEN in the last 168 hours.  Recent  Results (from the past 240 hour(s))  SARS Coronavirus 2     Status: None   Collection Time: 12/04/18 10:53 AM  Result Value Ref Range Status   SARS Coronavirus 2 NOT DETECTED NOT DETECTED Final    Comment: (NOTE) SARS-CoV-2 target nucleic acids are NOT DETECTED. The SARS-CoV-2 RNA is generally detectable in upper and lower respiratory specimens during the acute phase of infection.  Negative  results do not preclude SARS-CoV-2 infection, do not rule out co-infections with other pathogens, and should not be used as the sole basis for treatment or other patient management decisions.  Negative results must be combined with clinical observations, patient history, and epidemiological information. The expected result is Not Detected. Fact Sheet for Patients: http://www.biofiredefense.com/wp-content/uploads/2020/03/BIOFIRE-COVID -19-patients.pdf Fact Sheet for Healthcare Providers:  http://www.biofiredefense.com/wp-content/uploads/2020/03/BIOFIRE-COVID -19-hcp.pdf This test is not yet approved or cleared by the [place] FDA and  has been authorized for detection and/or diagnosis of SARS-CoV-2 by FDA under an Emergency Use Authorization (EUA).  This EUA will remain in effec t (meaning this test can be used) for the duration of  the COVID-19 declaration under Section 564(b)(1) of the Act, 21 U.S.C. section 360bbb-3(b)(1), unless the authorization is terminated or revoked sooner. Performed at [facility], [address] [place], [place] [address]   MRSA PCR Screening     Status: Abnormal   Collection Time: 12/07/18 12:01 PM   Specimen: Nasopharyngeal  Result Value Ref Range Status   MRSA by PCR POSITIVE (A) NEGATIVE Final    Comment:        The GeneXpert MRSA Assay (FDA approved for NASAL specimens only), is one component of a comprehensive MRSA colonization surveillance program. It is not intended to diagnose MRSA infection nor to guide or monitor treatment for MRSA infections. CRITICAL RESULT CALLED TO, READ BACK BY AND VERIFIED WITH: RN GRACE SISON [id number] [phone] FCP Performed at [facility], [address] [place], [place] [address]   SARS Coronavirus 2     Status: None   Collection Time: 12/11/18 12:52 AM  Result Value Ref Range Status   SARS Coronavirus 2 NOT DETECTED NOT DETECTED Final    Comment: (NOTE) SARS-CoV-2 target nucleic acids are NOT DETECTED. The SARS-CoV-2 RNA is generally detectable in upper and lower respiratory specimens during the acute phase of infection.  Negative  results do not preclude SARS-CoV-2 infection, do not rule out co-infections with other pathogens, and should not be used as the sole basis for treatment or other patient management decisions.  Negative results must be combined with clinical observations, patient history, and epidemiological information. The expected result is Not Detected. Fact  Sheet for Patients: http://www.biofiredefense.com/wp-content/uploads/2020/03/BIOFIRE-COVID -19-patients.pdf Fact Sheet for Healthcare Providers: http://www.biofiredefense.com/wp-content/uploads/2020/03/BIOFIRE-COVID -19-hcp.pdf This test is not yet approved or cleared by the [place] FDA and  has been authorized for detection and/or diagnosis of SARS-CoV-2 by FDA under an Emergency Use Authorization (EUA).  This EUA will remain in effec t (meaning this test can be used) for the duration of  the COVID-19 declaration under Section 564(b)(1) of the Act, 21 U.S.C. section 360bbb-3(b)(1), unless the authorization is terminated or revoked sooner. Performed at [facility], [address] [place], [place] [address]          Radiology Studies: Dg Chest Port 1 View  Result Date: 12/10/2018 CLINICAL DATA:  Shortness of breath EXAM: PORTABLE CHEST 1 VIEW COMPARISON:  12/04/2018 FINDINGS: There is cardiomegaly with findings of pulmonary edema. There are likely small bilateral pleural  effusions. There is likely adjacent compressive atelectasis. There is no pneumothorax. No acute osseous abnormality. IMPRESSION: Cardiomegaly with pulmonary edema. There are persistent small bilateral pleural effusions. Electronically Signed   By: Constance Holster M.D.   On: 12/10/2018 23:22        Scheduled Meds: . ARIPiprazole  10 mg Oral Daily  . aspirin EC  81 mg Oral Daily  . carbamazepine  100 mg Oral BID  . carvedilol  3.125 mg Oral BID WC  . enoxaparin (LOVENOX) injection  40 mg Subcutaneous Q24H  . furosemide  40 mg Oral BID  . hydrocortisone  25 mg Rectal BID  . insulin aspart  0-5 Units Subcutaneous QHS  . insulin aspart  0-9 Units Subcutaneous TID WC  . insulin glargine  15 Units Subcutaneous QHS  . lisinopril  10 mg Oral Daily  . nicotine  21 mg Transdermal Daily  . potassium chloride SA  20 mEq Oral BID  . sodium chloride flush  3 mL Intravenous Q12H   Continuous  Infusions: . sodium chloride       LOS: 0 days     Georgette Shell, MD [facility]  If 7PM-7AM, please contact night-coverage [email or web address] Password [facility] 12/12/2018, 11:25 AM

## 2018-12-12 NOTE — [place] FL2 (Signed)
MEDICAID FL2 LEVEL OF CARE SCREENING TOOL     IDENTIFICATION  Patient Name: [name] Birthdate: 29-Dec-1960 Sex: male Admission Date (Current Location): 12/10/2018  [facility] [place]   Facility and Address:  [facility], [address], [place], [place] [address]      Provider Number: [id number]  Attending Physician Name and Address:  Alwyn RenMathews, Elizabeth G, MD  Relative Name and Phone Number:       Current Level of Care: Hospital Recommended Level of Care: Skilled Nursing Facility Prior Approval Number: under  manual review will need f/u  Date Approved/Denied:   PASRR Number:    Discharge Plan: SNF    Current Diagnoses: Patient Active Problem List   Diagnosis Date Noted  . Diabetes mellitus without complication (HCC) 12/11/2018  . Acute on chronic systolic (congestive) heart failure (HCC) 12/11/2018  . Abdominal pain 12/11/2018  . Nausea vomiting and diarrhea 12/11/2018  . Anemia 12/11/2018  . Evaluation by psychiatric service required   . MDD (major depressive disorder), severe (HCC) 11/25/2018  . Pressure injury of skin 11/08/2018  . Elevated troponin 10/18/2018  . HLD (hyperlipidemia) 10/18/2018  . Anxiety 10/18/2018  . CHF (congestive heart failure), NYHA class II, acute on chronic, combined (HCC) 10/18/2018  . Hypertensive urgency 10/12/2018  . Mild renal insufficiency 10/12/2018  . Cellulitis 10/12/2018  . Penile cellulitis   . Adjustment disorder with mixed disturbance of emotions and conduct   . Sleep apnea 10/03/2018  . Hypokalemia 09/17/2018  . Scrotal swelling 09/17/2018  . Urinary hesitancy   . Constipation   . Acute on chronic combined systolic and diastolic CHF (congestive heart failure) (HCC) 08/25/2018  . Acute respiratory failure with hypoxia (HCC) 08/25/2018  . Tobacco use 08/18/2018  . Homelessness 08/08/2018  . Smoker 08/08/2018  . Prostate enlargement 03/16/2018  . Aortic  atherosclerosis (HCC) 03/16/2018  . 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 Chronic foot pain   . Schizoaffective disorder, bipolar type (HCC) 09/30/2016  . Substance induced mood disorder (HCC) 03/13/2015  . Schizophrenia, paranoid type (HCC) 01/17/2015  . Drug hallucinosis (HCC) 10/08/2014  . Chronic paranoid schizophrenia (HCC) 09/07/2014  . 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 Urinary retention   . Cocaine use disorder, severe, dependence (HCC)   . Essential hypertension, benign 03/28/2013  . Insulin-requiring or dependent type II diabetes mellitus (HCC) 03/15/2013    Orientation RESPIRATION BLADDER Height & Weight     Self, Time, Situation, Place  O2, Other (Comment)(3L) Incontinent, External catheter Weight: 248 lb 10.9 oz (112.8 kg) Height:  5\' 9"  (175.3 cm)  BEHAVIORAL SYMPTOMS/MOOD NEUROLOGICAL BOWEL NUTRITION STATUS      Incontinent Diet(Carb modified)  AMBULATORY STATUS COMMUNICATION OF NEEDS Skin   Limited Assist Verbally Other (Comment)(dry skin on heel and hip)                       Personal Care Assistance Level of Assistance    Bathing Assistance: Limited assistance Feeding assistance: Independent Dressing Assistance: Limited assistance Total Care Assistance: Limited assistance   Functional Limitations Info  Sight, Hearing, Speech Sight Info: Adequate Hearing Info: Adequate Speech Info: Adequate    SPECIAL CARE FACTORS FREQUENCY  PT (By licensed PT), OT (By licensed OT)     PT Frequency: 5x per week OT Frequency: 5x per week  Contractures Contractures Info: Not present    Additional Factors Info  Isolation Precautions, Allergies, Code Status Code Status Info: Full Allergies Info: Haldol Haloperidol   Insulin Sliding Scale Info: (novolog 0-5 units at bedtime, novolog 0-9 units 3x per day with meals, Lantus 15 units at bedtime) Isolation Precautions  Info: MRSA     Current Medications (12/12/2018):  This is the current hospital active medication list Current Facility-Administered Medications  Medication Dose Route Frequency Provider Last Rate Last Dose  . 0.9 %  sodium chloride infusion  250 mL Intravenous PRN Niu, Xilin, MD      . acetaminophen (TYLENOL) tablet 650 mg  650 mg Oral Q6H PRN Niu, Xilin, MD      . acetaminophen (TYLENOL) tablet 650 mg  650 mg Oral Q4H PRN Niu, Xilin, MD   650 mg at 12/12/18 1128  . albuterol (PROVENTIL) (2.5 MG/3ML) 0.083% nebulizer solution 2.5 mg  2.5 mg Nebulization Q4H PRN Ghimire, Kuber, MD      . ARIPiprazole (ABILIFY) tablet 10 mg  10 mg Oral Daily Niu, Xilin, MD   10 mg at 12/12/18 0858  . aspirin EC tablet 81 mg  81 mg Oral Daily Niu, Xilin, MD   81 mg at 12/12/18 0858  . carbamazepine (TEGRETOL) tablet 100 mg  100 mg Oral BID Niu, Xilin, MD   100 mg at 12/12/18 0948  . carvedilol (COREG) tablet 3.125 mg  3.125 mg Oral BID WC Ghimire, Kuber, MD   3.125 mg at 12/12/18 0858  . dextromethorphan-guaiFENesin (MUCINEX DM) 30-600 MG per 12 hr tablet 1 tablet  1 tablet Oral BID PRN Niu, Xilin, MD      . enoxaparin (LOVENOX) injection 40 mg  40 mg Subcutaneous Q24H Niu, Xilin, MD   40 mg at 12/12/18 0644  . furosemide (LASIX) tablet 40 mg  40 mg Oral BID Niu, Xilin, MD   40 mg at 12/12/18 0858  . hydrALAZINE (APRESOLINE) injection 5 mg  5 mg Intravenous Q2H PRN Niu, Xilin, MD      . hydrocortisone (ANUSOL-HC) suppository 25 mg  25 mg Rectal BID Ghimire, Kuber, MD      . insulin aspart (novoLOG) injection 0-5 Units  0-5 Units Subcutaneous QHS Niu, Xilin, MD   2 Units at 12/11/18 2129  . insulin aspart (novoLOG) injection 0-9 Units  0-9 Units Subcutaneous TID WC Niu, Xilin, MD   3 Units at 12/12/18 1235  . insulin glargine (LANTUS) injection 15 Units  15 Units Subcutaneous QHS Ghimire, Kuber, MD   15 Units at 12/11/18 2128  . lisinopril (ZESTRIL) tablet 10 mg  10 mg Oral Daily Niu, Xilin, MD   10 mg at 12/12/18  0859  . nicotine (NICODERM CQ - dosed in mg/24 hours) patch 21 mg  21 mg Transdermal Daily Niu, Xilin, MD   21 mg at 12/12/18 0859  . ondansetron (ZOFRAN) injection 4 mg  4 mg Intravenous Q6H PRN Niu, Xilin, MD      . potassium chloride SA (K-DUR) CR tablet 20 mEq  20 mEq Oral BID Niu, Xilin, MD   20 mEq at 12/12/18 0858  . sodium chloride flush (NS) 0.9 % injection 3 mL  3 mL Intravenous Q12H Niu, Xilin, MD   3 mL at 12/11/18 2130  . sodium chloride flush (NS) 0.9 % injection 3 mL  3 mL Intravenous PRN Niu, Xilin, MD         Discharge Medications: Please see discharge summary for a list of discharge medications.    Relevant Imaging Results:  Relevant Lab Results:   Additional Information [phone]  Bary Castilla, LCSW

## 2018-12-12 NOTE — TOC Initial Note (Signed)
Transition of Care [facility]) - Initial/Assessment Note    Patient Details  Name: [name] MRN: [id number] Date of Birth: 09-18-1960  Transition of Care [facility]) CM/SW Contact:    Bary Castilla, LCSW Phone Number: 12/12/2018, 3:04 PM  Clinical Narrative:    CSW met with patient bedside and advised him of the recommendation of a SNF. Patient stated that he is agreeable to a SNF.Patient was open with CSW about substance use. CSW discussed potential SNF barriers with patient due to substance use  and AMA history. Patient stated that he would agree to any SNF that accepted him. CSW provided patient with bed list and received verbal permission to fax out referrals to SNFs.                 Expected Discharge Plan: Skilled Nursing Facility Barriers to Discharge: Active Substance Use - Placement, SNF Pending bed offer, Other (comment)   Patient Goals and CMS Choice     Choice offered to / list presented to : Patient  Expected Discharge Plan and Services Expected Discharge Plan: [place]       Living arrangements for the past 2 months: Homeless                                      Prior Living Arrangements/Services Living arrangements for the past 2 months: Homeless Lives with:: Self Patient language and need for interpreter reviewed:: Yes        Need for Family Participation in Patient Care: No (Comment) Care giver support system in place?: No (comment)   Criminal Activity/Legal Involvement Pertinent to Current Situation/Hospitalization: No - Comment as needed  Activities of Daily Living      Permission Sought/Granted   Permission granted to share information with : Yes, Verbal Permission Granted     Permission granted to share info w AGENCY: SNFs        Emotional Assessment Appearance:: Appears stated age Attitude/Demeanor/Rapport: Engaged Affect (typically observed): Accepting Orientation: : Oriented to Self, Oriented to Place,  Oriented to  Time, Oriented to Situation Alcohol / Substance Use: Illicit Drugs Psych Involvement: No (comment)  Admission diagnosis:  Acute on chronic congestive heart failure, unspecified heart failure type [facility]) [I50.9] Patient Active Problem List   Diagnosis Date Noted  . Diabetes mellitus without complication ([place]) 27/74/1287  . Acute on chronic systolic (congestive) heart failure ([place]) 12/11/2018  . Abdominal pain 12/11/2018  . Nausea vomiting and diarrhea 12/11/2018  . Anemia 12/11/2018  . Evaluation by psychiatric service required   . MDD (major depressive disorder), severe ([place]) 11/25/2018  . Pressure injury of skin 11/08/2018  . Elevated troponin 10/18/2018  . HLD (hyperlipidemia) 10/18/2018  . Anxiety 10/18/2018  . CHF (congestive heart failure), NYHA class II, acute on chronic, combined ([place]) 10/18/2018  . Hypertensive urgency 10/12/2018  . Mild renal insufficiency 10/12/2018  . Cellulitis 10/12/2018  . Penile cellulitis   . Adjustment disorder with mixed disturbance of emotions and conduct   . Sleep apnea 10/03/2018  . Hypokalemia 09/17/2018  . Scrotal swelling 09/17/2018  . Urinary hesitancy   . Constipation   . Acute on chronic combined systolic and diastolic CHF (congestive heart failure) ([place]) 08/25/2018  . Acute respiratory failure with hypoxia ([place]) 08/25/2018  . Tobacco use 08/18/2018  . Homelessness 08/08/2018  . Smoker 08/08/2018  . Prostate enlargement 03/16/2018  . Aortic atherosclerosis ([place]) 03/16/2018  .  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 Chronic foot pain   . Schizoaffective disorder, bipolar type ([place]) 09/30/2016  . Substance induced mood disorder ([place]) 03/13/2015  . Schizophrenia, paranoid type ([place]) 01/17/2015  . Drug hallucinosis ([place]) 10/08/2014  . Chronic paranoid schizophrenia ([place]) 09/07/2014  . Substance or medication-induced bipolar and related disorder with onset during intoxication ([place]) 08/10/2014  . Urinary retention    . Cocaine use disorder, severe, dependence ([place])   . Essential hypertension, benign 03/28/2013  . Insulin-requiring or dependent type II diabetes mellitus ([place]) 03/15/2013   PCP:  Patient, No Pcp Per Pharmacy:   [facility], [place] [address] Phone: [phone] Fax: [phone]  [place], [place] - [address] [place] [address] Phone: [phone] Fax: [phone]  [facility] [phone] - [place], [place] - [address] [place] [address] Phone: [phone] Fax: [phone]     Social Determinants of Health (SDOH) Interventions    Readmission Risk Interventions Readmission Risk Prevention Plan 10/05/2018  Transportation Screening Complete  Medication Review ([place]) Complete  PCP or Specialist appointment within 3-5 days of discharge Complete  HRI or Home Care Consult Patient refused  SW Recovery Care/Counseling Consult Complete  Palliative Care Screening Not [place] Not Applicable  Some recent data might be hidden

## 2018-12-12 NOTE — [place] FL2 (Signed)
MEDICAID FL2 LEVEL OF CARE SCREENING TOOL     IDENTIFICATION  Patient Name: [name] Birthdate: 29-Dec-1960 Sex: male Admission Date (Current Location): 12/10/2018  [facility] [place]   Facility and Address:  [facility], [address], [place], [place] [address]      Provider Number: [id number]  Attending Physician Name and Address:  Alwyn RenMathews, Elizabeth G, MD  Relative Name and Phone Number:       Current Level of Care: Hospital Recommended Level of Care: Skilled Nursing Facility Prior Approval Number: under  manual review will need f/u  Date Approved/Denied:   PASRR Number:    Discharge Plan: SNF    Current Diagnoses: Patient Active Problem List   Diagnosis Date Noted  . Diabetes mellitus without complication (HCC) 12/11/2018  . Acute on chronic systolic (congestive) heart failure (HCC) 12/11/2018  . Abdominal pain 12/11/2018  . Nausea vomiting and diarrhea 12/11/2018  . Anemia 12/11/2018  . Evaluation by psychiatric service required   . MDD (major depressive disorder), severe (HCC) 11/25/2018  . Pressure injury of skin 11/08/2018  . Elevated troponin 10/18/2018  . HLD (hyperlipidemia) 10/18/2018  . Anxiety 10/18/2018  . CHF (congestive heart failure), NYHA class II, acute on chronic, combined (HCC) 10/18/2018  . Hypertensive urgency 10/12/2018  . Mild renal insufficiency 10/12/2018  . Cellulitis 10/12/2018  . Penile cellulitis   . Adjustment disorder with mixed disturbance of emotions and conduct   . Sleep apnea 10/03/2018  . Hypokalemia 09/17/2018  . Scrotal swelling 09/17/2018  . Urinary hesitancy   . Constipation   . Acute on chronic combined systolic and diastolic CHF (congestive heart failure) (HCC) 08/25/2018  . Acute respiratory failure with hypoxia (HCC) 08/25/2018  . Tobacco use 08/18/2018  . Homelessness 08/08/2018  . Smoker 08/08/2018  . Prostate enlargement 03/16/2018  . Aortic  atherosclerosis (HCC) 03/16/2018  . 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 Chronic foot pain   . Schizoaffective disorder, bipolar type (HCC) 09/30/2016  . Substance induced mood disorder (HCC) 03/13/2015  . Schizophrenia, paranoid type (HCC) 01/17/2015  . Drug hallucinosis (HCC) 10/08/2014  . Chronic paranoid schizophrenia (HCC) 09/07/2014  . 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 Urinary retention   . Cocaine use disorder, severe, dependence (HCC)   . Essential hypertension, benign 03/28/2013  . Insulin-requiring or dependent type II diabetes mellitus (HCC) 03/15/2013    Orientation RESPIRATION BLADDER Height & Weight     Self, Time, Situation, Place  O2, Other (Comment)(3L) Incontinent, External catheter Weight: 248 lb 10.9 oz (112.8 kg) Height:  5\' 9"  (175.3 cm)  BEHAVIORAL SYMPTOMS/MOOD NEUROLOGICAL BOWEL NUTRITION STATUS      Incontinent Diet(Carb modified)  AMBULATORY STATUS COMMUNICATION OF NEEDS Skin   Limited Assist Verbally Other (Comment)(dry skin on heel and hip)                       Personal Care Assistance Level of Assistance    Bathing Assistance: Limited assistance Feeding assistance: Independent Dressing Assistance: Limited assistance Total Care Assistance: Limited assistance   Functional Limitations Info  Sight, Hearing, Speech Sight Info: Adequate Hearing Info: Adequate Speech Info: Adequate    SPECIAL CARE FACTORS FREQUENCY  PT (By licensed PT), OT (By licensed OT)     PT Frequency: 5x per week OT Frequency: 5x per week  Contractures Contractures Info: Not present    Additional Factors Info  Isolation Precautions, Allergies, Code Status Code Status Info: Full Allergies Info: Haldol Haloperidol   Insulin Sliding Scale Info: (novolog 0-5 units at bedtime, novolog 0-9 units 3x per day with meals, Lantus 15 units at bedtime) Isolation Precautions  Info: MRSA     Current Medications (12/12/2018):  This is the current hospital active medication list Current Facility-Administered Medications  Medication Dose Route Frequency Provider Last Rate Last Dose  . 0.9 %  sodium chloride infusion  250 mL Intravenous PRN Lorretta HarpNiu, Xilin, MD      . acetaminophen (TYLENOL) tablet 650 mg  650 mg Oral Q6H PRN Lorretta HarpNiu, Xilin, MD      . acetaminophen (TYLENOL) tablet 650 mg  650 mg Oral Q4H PRN Lorretta HarpNiu, Xilin, MD   650 mg at 12/12/18 1128  . albuterol (PROVENTIL) (2.5 MG/3ML) 0.083% nebulizer solution 2.5 mg  2.5 mg Nebulization Q4H PRN Dorcas CarrowGhimire, Kuber, MD      . ARIPiprazole (ABILIFY) tablet 10 mg  10 mg Oral Daily Lorretta HarpNiu, Xilin, MD   10 mg at 12/12/18 0858  . aspirin EC tablet 81 mg  81 mg Oral Daily Lorretta HarpNiu, Xilin, MD   81 mg at 12/12/18 0858  . carbamazepine (TEGRETOL) tablet 100 mg  100 mg Oral BID Lorretta HarpNiu, Xilin, MD   100 mg at 12/12/18 0948  . carvedilol (COREG) tablet 3.125 mg  3.125 mg Oral BID WC Dorcas CarrowGhimire, Kuber, MD   3.125 mg at 12/12/18 0858  . dextromethorphan-guaiFENesin (MUCINEX DM) 30-600 MG per 12 hr tablet 1 tablet  1 tablet Oral BID PRN Lorretta HarpNiu, Xilin, MD      . enoxaparin (LOVENOX) injection 40 mg  40 mg Subcutaneous Q24H Lorretta HarpNiu, Xilin, MD   40 mg at 12/12/18 0644  . furosemide (LASIX) tablet 40 mg  40 mg Oral BID Lorretta HarpNiu, Xilin, MD   40 mg at 12/12/18 0858  . hydrALAZINE (APRESOLINE) injection 5 mg  5 mg Intravenous Q2H PRN Lorretta HarpNiu, Xilin, MD      . hydrocortisone (ANUSOL-HC) suppository 25 mg  25 mg Rectal BID Dorcas CarrowGhimire, Kuber, MD      . insulin aspart (novoLOG) injection 0-5 Units  0-5 Units Subcutaneous QHS Lorretta HarpNiu, Xilin, MD   2 Units at 12/11/18 2129  . insulin aspart (novoLOG) injection 0-9 Units  0-9 Units Subcutaneous TID WC Lorretta HarpNiu, Xilin, MD   3 Units at 12/12/18 1235  . insulin glargine (LANTUS) injection 15 Units  15 Units Subcutaneous QHS Dorcas CarrowGhimire, Kuber, MD   15 Units at 12/11/18 2128  . lisinopril (ZESTRIL) tablet 10 mg  10 mg Oral Daily Lorretta HarpNiu, Xilin, MD   10 mg at 12/12/18  0859  . nicotine (NICODERM CQ - dosed in mg/24 hours) patch 21 mg  21 mg Transdermal Daily Lorretta HarpNiu, Xilin, MD   21 mg at 12/12/18 0859  . ondansetron (ZOFRAN) injection 4 mg  4 mg Intravenous Q6H PRN Lorretta HarpNiu, Xilin, MD      . potassium chloride SA (K-DUR) CR tablet 20 mEq  20 mEq Oral BID Lorretta HarpNiu, Xilin, MD   20 mEq at 12/12/18 0858  . sodium chloride flush (NS) 0.9 % injection 3 mL  3 mL Intravenous Q12H Lorretta HarpNiu, Xilin, MD   3 mL at 12/11/18 2130  . sodium chloride flush (NS) 0.9 % injection 3 mL  3 mL Intravenous PRN Lorretta HarpNiu, Xilin, MD         Discharge Medications: Please see discharge summary for a list of discharge medications.  Relevant Imaging Results:  Relevant Lab Results:   Additional Information [phone]  Bary Castilla, LCSW

## 2018-12-13 ENCOUNTER — Inpatient Hospital Stay (HOSPITAL_COMMUNITY): Payer: Medicare Other

## 2018-12-13 LAB — GLUCOSE, CAPILLARY
Glucose-Capillary: 126 mg/dL — ABNORMAL HIGH (ref 70–99)
Glucose-Capillary: 210 mg/dL — ABNORMAL HIGH (ref 70–99)
Glucose-Capillary: 201 mg/dL — ABNORMAL HIGH (ref 70–99)
Glucose-Capillary: 273 mg/dL — ABNORMAL HIGH (ref 70–99)

## 2018-12-13 LAB — BASIC METABOLIC PANEL
Anion gap: 9 (ref 5–15)
BUN: 19 mg/dL (ref 6–20)
CO2: 31 mmol/L (ref 22–32)
Calcium: 8.9 mg/dL (ref 8.9–10.3)
Chloride: 98 mmol/L (ref 98–111)
Creatinine, Ser: 0.96 mg/dL (ref 0.61–1.24)
GFR calc Af Amer: >60 mL/min (ref >60)
GFR calc non Af Amer: >60 mL/min (ref >60)
Glucose, Bld: 120 mg/dL — ABNORMAL HIGH (ref 70–99)
Potassium: 4.2 mmol/L (ref 3.5–5.1)
Sodium: 138 mmol/L (ref 135–145)

## 2018-12-13 MED ORDER — FUROSEMIDE 10 MG/ML IJ SOLN
40.0000 mg | Freq: Two times a day (BID) | INTRAMUSCULAR | Status: DC
  Administered 2018-12-13 – 2018-12-14 (×3): 40 mg via INTRAVENOUS
  Filled 2018-12-13 (×4): qty 4

## 2018-12-13 NOTE — Progress Notes (Addendum)
PROGRESS NOTE    Taylor MemosFrederick L [name]  ZOX:[id number] DOB: [birth year] DOA: 12/10/2018 PCP: Patient, No Pcp Per   Brief Narrative: 58 y.o.malewith medical history significant ofhomelessness, hypertension, hyperlipidemia, diabetes mellitus, polysubstance abuse, tobacco abuse, cocaine abuse, depression, anxiety, HCV,[name] with EF of 25-30%, schizophrenia, AAA, who presents with shortness of breath.  Patient was recently hospitalized from 6/5-6/11 due to CHF exacerbation. Patient was just discharged yesterday. It appears SNF was recommended however it was not pursued since the patient has a hx of leaving AMA and has outbursts at staff.Pt was provided with wheelchair upondischarge,but it was left on the side of the road. Hestates that he has a worsening shortness of breath and a mild dry cough. Denies chest pain, fever or chills. He has bilateral leg edema. He also reports intermittent nausea, vomiting, diarrhea and abdominal pain. He states that he had approximately 8 times of loose stool bowel movement and vomited once. His abdominal pain is diffuse, moderate, constant nonradiating. Denies symptoms of UTI or unilateral weakness. Of note,previous admission,psych evaluated pt and they determined he has capacity to make his own decisions.  ED Course:pt was found to have BNP 949.9, pending COVID-19 test, creatinine 1.03, BUN 29, temperature normal, tachycardia, oxygen desaturated to 86% on room air, which improved to 94% on 3 L nasal cannula oxygen. Chest x-ray showed pulmonary edema and cardiomegaly. Patient is placed on telemetry bed for observation.   Assessment & Plan:   Principal Problem:   Acute on chronic systolic (congestive) heart failure (HCC) Active Problems:   Essential hypertension, benign   Cocaine use disorder, severe, dependence (HCC)   Chronic paranoid schizophrenia (HCC)   Tobacco use   Acute respiratory failure with hypoxia (HCC)   MDD (major depressive  disorder), severe (HCC)   Diabetes mellitus without complication (HCC)   Abdominal pain   Nausea vomiting and diarrhea   Anemia  Acute respiratory failure with hypoxiadue to acute on chronic systolicCHF:Patient presented with elevated BNP 949, positive JVD and bilateral leg edema. Chest x-ray showed pulmonary edema, consistent with CHF exacerbation.Patient had 2D echo on 11/07/2018 which showed EF of 25-30%. Pt is homeless. Hedoes not seem to be able to take care of himself medically. Will need placement.  Change p.o. to IV Lasix 40 twice daily.  He is only negative by 700 cc.   HTN:  -Continue home medications:Coreg, lisinopril -Patient is on IV Lasix -IV hydralazine prn  Cocaine use disorder, severe, dependenceandTobacco use: -Did counseling about importance of quitting substance use -Nicotine patch  Chronic paranoid schizophreniaandMDD (major depressive disorder), severe (HCC):No SI or HI. -continue homeAbilify and Tegretol  Type II diabetes mellitus without complication (HCC):Last A1c9.7 on 12/04/18, poorly controled. Patient is takingmetforminat home -SSI  Abdominal pain,Nausea vomiting and diarrhea:Resolved.   Anemia of chronic diseaseHgb stable, 9.4 on 12/08/18-->9   DVT ppx: SQ Lovenox Code Status:Full code (I discussed with the patient about CODE STATUS, and explained the meaning of CODE STATUS, he wants to be full code today) Family Communication: He did not wanted me to speak with anyone from the family he has a daughter. Disposition Plan: Anticipate discharge SNF Consults called:none    Estimated body mass index is 37.6 kg/m as calculated from the following:   Height as of this encounter: 5\' 9"  (1.753 m).   Weight as of this encounter: 115.5 kg.    Subjective:  He is resting in bed appears comfortable feels breathing is better edema is decreasing however after I left he reported to the RN  that he is short of breath with no  other changes in parameters he remained on same oxygen at 2.5 L. Objective: Vitals:   12/12/18 1143 12/12/18 2015 12/13/18 0606 12/13/18 0725  BP: (!) 152/90 (!) 147/93 (!) 142/89 (!) 149/96  Pulse: 88 85 81   Resp: (!) 21 (!) 21 16   Temp: 97.9 F (36.6 C) 98.3 F (36.8 C) 97.9 F (36.6 C)   TempSrc: Oral Oral Oral   SpO2: 98% 97% 94%   Weight:   115.5 kg   Height:        Intake/Output Summary (Last 24 hours) at 12/13/2018 1123 Last data filed at 12/13/2018 0828 Gross per 24 hour  Intake 1440 ml  Output 1550 ml  Net -110 ml   Filed Weights   12/11/18 0357 12/13/18 0606  Weight: 112.8 kg 115.5 kg    Examination:  General exam: Appears calm and comfortable  Respiratory system few crackles at the base to auscultation. Respiratory effort normal. Cardiovascular system: S1 & S2 heard, RRR. No JVD, murmurs, rubs, gallops or clicks. No pedal edema. Gastrointestinal system: Abdomen is nondistended, soft and nontender. No organomegaly or masses felt. Normal bowel sounds heard. Central nervous system: Alert and oriented. No focal neurological deficits. Extremities: 1+ pitting edema decreased compared to yesterday Skin: No rashes, lesions or ulcers Psychiatry: Judgement and insight appear normal. Mood & affect appropriate.     Data Reviewed: I have personally reviewed following labs and imaging studies  CBC: Recent Labs  Lab 12/08/18 0434 12/10/18 0343 12/10/18 2205  WBC 7.5 6.7 9.6  HGB 9.4* 9.0* 9.4*  HCT 31.5* 30.4* 32.1*  MCV 78.9* 79.2* 79.5*  PLT 275 234 798   Basic Metabolic Panel: Recent Labs  Lab 12/08/18 0434 12/10/18 0343 12/10/18 2205 12/12/18 0352 12/13/18 0635  NA 141 141 141 139 138  K 3.5 3.9 3.8 3.8 4.2  CL 99 99 99 97* 98  CO2 34* 34* 33* 32 31  GLUCOSE 202* 229* 182* 142* 120*  BUN 25* 25* 27* 22* 19  CREATININE 1.11 1.03 1.03 0.93 0.96  CALCIUM 8.9 8.7* 8.8* 8.9 8.9  MG 1.9  --   --   --   --    GFR: Estimated Creatinine Clearance:  106.4 mL/min (by C-G formula based on SCr of 0.96 mg/dL). Liver Function Tests: No results for input(s): AST, ALT, ALKPHOS, BILITOT, PROT, ALBUMIN in the last 168 hours. Recent Labs  Lab 12/11/18 0407  LIPASE 25   No results for input(s): AMMONIA in the last 168 hours. Coagulation Profile: No results for input(s): INR, PROTIME in the last 168 hours. Cardiac Enzymes: No results for input(s): CKTOTAL, CKMB, CKMBINDEX, TROPONINI in the last 168 hours. BNP (last 3 results) No results for input(s): PROBNP in the last 8760 hours. HbA1C: No results for input(s): HGBA1C in the last 72 hours. CBG: Recent Labs  Lab 12/12/18 0735 12/12/18 1126 12/12/18 1656 12/12/18 2105 12/13/18 0723  GLUCAP 143* 231* 198* 205* 126*   Lipid Profile: No results for input(s): CHOL, HDL, LDLCALC, TRIG, CHOLHDL, LDLDIRECT in the last 72 hours. Thyroid Function Tests: No results for input(s): TSH, T4TOTAL, FREET4, T3FREE, THYROIDAB in the last 72 hours. Anemia Panel: No results for input(s): VITAMINB12, FOLATE, FERRITIN, TIBC, IRON, RETICCTPCT in the last 72 hours. Sepsis Labs: No results for input(s): PROCALCITON, LATICACIDVEN in the last 168 hours.  Recent Results (from the past 240 hour(s))  SARS Coronavirus 2     Status: None   Collection Time:  12/04/18 10:53 AM  Result Value Ref Range Status   SARS Coronavirus 2 NOT DETECTED NOT DETECTED Final    Comment: (NOTE) SARS-CoV-2 target nucleic acids are NOT DETECTED. The SARS-CoV-2 RNA is generally detectable in upper and lower respiratory specimens during the acute phase of infection.  Negative  results do not preclude SARS-CoV-2 infection, do not rule out co-infections with other pathogens, and should not be used as the sole basis for treatment or other patient management decisions.  Negative results must be combined with clinical observations, patient history, and epidemiological information. The expected result is Not Detected. Fact Sheet for  Patients: http://www.biofiredefense.com/wp-content/uploads/2020/03/BIOFIRE-COVID -19-patients.pdf Fact Sheet for Healthcare Providers: http://www.biofiredefense.com/wp-content/uploads/2020/03/BIOFIRE-COVID -19-hcp.pdf This test is not yet approved or cleared by the [place] FDA and  has been authorized for detection and/or diagnosis of SARS-CoV-2 by FDA under an Emergency Use Authorization (EUA).  This EUA will remain in effec t (meaning this test can be used) for the duration of  the COVID-19 declaration under Section 564(b)(1) of the Act, 21 U.S.C. section 360bbb-3(b)(1), unless the authorization is terminated or revoked sooner. Performed at [facility], [address] [place], [place] [address]   MRSA PCR Screening     Status: Abnormal   Collection Time: 12/07/18 12:01 PM   Specimen: Nasopharyngeal  Result Value Ref Range Status   MRSA by PCR POSITIVE (A) NEGATIVE Final    Comment:        The GeneXpert MRSA Assay (FDA approved for NASAL specimens only), is one component of a comprehensive MRSA colonization surveillance program. It is not intended to diagnose MRSA infection nor to guide or monitor treatment for MRSA infections. CRITICAL RESULT CALLED TO, READ BACK BY AND VERIFIED WITH: RN GRACE SISON [id number] [phone] FCP Performed at [facility], [address] [place], [place] [address]   SARS Coronavirus 2     Status: None   Collection Time: 12/11/18 12:52 AM  Result Value Ref Range Status   SARS Coronavirus 2 NOT DETECTED NOT DETECTED Final    Comment: (NOTE) SARS-CoV-2 target nucleic acids are NOT DETECTED. The SARS-CoV-2 RNA is generally detectable in upper and lower respiratory specimens during the acute phase of infection.  Negative  results do not preclude SARS-CoV-2 infection, do not rule out co-infections with other pathogens, and should not be used as the sole basis for treatment or other patient management decisions.  Negative results must  be combined with clinical observations, patient history, and epidemiological information. The expected result is Not Detected. Fact Sheet for Patients: http://www.biofiredefense.com/wp-content/uploads/2020/03/BIOFIRE-COVID -19-patients.pdf Fact Sheet for Healthcare Providers: http://www.biofiredefense.com/wp-content/uploads/2020/03/BIOFIRE-COVID -19-hcp.pdf This test is not yet approved or cleared by the [place] FDA and  has been authorized for detection and/or diagnosis of SARS-CoV-2 by FDA under an Emergency Use Authorization (EUA).  This EUA will remain in effec t (meaning this test can be used) for the duration of  the COVID-19 declaration under Section 564(b)(1) of the Act, 21 U.S.C. section 360bbb-3(b)(1), unless the authorization is terminated or revoked sooner. Performed at [facility], [address] [place], [place] [address]          Radiology Studies: No results found.      Scheduled Meds:  ARIPiprazole  10 mg Oral Daily   aspirin EC  81 mg Oral Daily   carbamazepine  100 mg Oral BID   carvedilol  3.125 mg Oral BID WC   enoxaparin (LOVENOX) injection  40 mg Subcutaneous Q24H   furosemide  40  mg Oral BID   hydrocortisone  25 mg Rectal BID   insulin aspart  0-5 Units Subcutaneous QHS   insulin aspart  0-9 Units Subcutaneous TID WC   insulin glargine  15 Units Subcutaneous QHS   lisinopril  10 mg Oral Daily   nicotine  21 mg Transdermal Daily   potassium chloride SA  20 mEq Oral BID   sodium chloride flush  3 mL Intravenous Q12H   Continuous Infusions:  sodium chloride       LOS: 1 day     Alwyn RenElizabeth G Emmelyn Schmale, MD [facility]  If 7PM-7AM, please contact night-coverage [email or web address] Password [email or web address] 12/13/2018, 11:23 AM

## 2018-12-13 NOTE — Progress Notes (Signed)
Condom catheter removed at patient's request, instructed to use urinal now, will not replace D/T sore on side of penis, will continue to monitor, no other changes noted at this time.

## 2018-12-14 LAB — BASIC METABOLIC PANEL
Anion gap: 6 (ref 5–15)
BUN: 22 mg/dL — ABNORMAL HIGH (ref 6–20)
CO2: 32 mmol/L (ref 22–32)
Calcium: 9 mg/dL (ref 8.9–10.3)
Chloride: 101 mmol/L (ref 98–111)
Creatinine, Ser: 1.07 mg/dL (ref 0.61–1.24)
GFR calc Af Amer: >60 mL/min (ref >60)
GFR calc non Af Amer: >60 mL/min (ref >60)
Glucose, Bld: 146 mg/dL — ABNORMAL HIGH (ref 70–99)
Potassium: 4.3 mmol/L (ref 3.5–5.1)
Sodium: 139 mmol/L (ref 135–145)

## 2018-12-14 LAB — GLUCOSE, CAPILLARY
Glucose-Capillary: 146 mg/dL — ABNORMAL HIGH (ref 70–99)
Glucose-Capillary: 221 mg/dL — ABNORMAL HIGH (ref 70–99)
Glucose-Capillary: 210 mg/dL — ABNORMAL HIGH (ref 70–99)
Glucose-Capillary: 367 mg/dL — ABNORMAL HIGH (ref 70–99)

## 2018-12-14 MED ORDER — ALUM & MAG HYDROXIDE-SIMETH 200-200-20 MG/5ML PO SUSP
30.0000 mL | Freq: Four times a day (QID) | ORAL | Status: DC | PRN
  Administered 2018-12-14: 30 mL via ORAL
  Filled 2018-12-14: qty 30

## 2018-12-14 MED ORDER — LORAZEPAM 2 MG/ML IJ SOLN
1.0000 mg | Freq: Once | INTRAMUSCULAR | Status: AC
  Administered 2018-12-14: 1 mg via INTRAVENOUS
  Filled 2018-12-14: qty 1

## 2018-12-14 MED ORDER — GLIPIZIDE 2.5 MG HALF TABLET
2.5000 mg | ORAL_TABLET | Freq: Every day | ORAL | Status: DC
  Administered 2018-12-15: 2.5 mg via ORAL
  Filled 2018-12-14: qty 1

## 2018-12-14 MED ORDER — CARVEDILOL 6.25 MG PO TABS
6.2500 mg | ORAL_TABLET | Freq: Two times a day (BID) | ORAL | Status: DC
  Administered 2018-12-14 – 2018-12-15 (×2): 6.25 mg via ORAL
  Filled 2018-12-14 (×2): qty 1

## 2018-12-14 NOTE — Progress Notes (Signed)
Physical Therapy Treatment Patient Details Name: [name] MRN: [id number] DOB: Oct 16, 1960 Today's Date: 12/14/2018    History of Present Illness JARMARCUS WAMBOLD is a 58 y.o. male with medical history significant of homelessness, hypertension, hyperlipidemia, diabetes mellitus, polysubstance abuse, tobacco abuse, cocaine abuse, depression, anxiety, HCV, [name] with EF of 25-30%, schizophrenia, AAA, who presents with shortness of breath after recent d/c 6/11.    PT Comments    Pt agreeable to therapy, states " I need to go to a rest home.". Pt is min guard for transfers and ambulation with RW however is limited in safe mobility by oxygen desaturation (see General Comments). D/c plans remain appropriate at this time. PT will continue to follow acutely.     Follow Up Recommendations  SNF     Equipment Recommendations  Rolling walker with 5" wheels    Recommendations for Other Services       Precautions / Restrictions Precautions Precautions: Fall Restrictions Weight Bearing Restrictions: No    Mobility  Bed Mobility               General bed mobility comments: sitting EoB on entry   Transfers Overall transfer level: Needs assistance Equipment used: Rolling walker (2 wheeled) Transfers: Sit to/from Stand Sit to Stand: Min guard         General transfer comment: cues for hand placement and safety  Ambulation/Gait Ambulation/Gait assistance: Min guard Gait Distance (Feet): 60 Feet Assistive device: Rolling walker (2 wheeled) Gait Pattern/deviations: Step-through pattern Gait velocity: slower Gait velocity interpretation: <1.31 ft/sec, indicative of household ambulator General Gait Details: wide BOS with short, waddling steps due to perineal/scotal edema.       Balance Overall balance assessment: Needs assistance   Sitting balance-Leahy Scale: Good     Standing balance support: Bilateral upper extremity supported Standing balance-Leahy  Scale: Poor Standing balance comment: reliant on the RW for dynamic mobility                            Cognition Arousal/Alertness: Awake/alert Behavior During Therapy: Restless;WFL for tasks assessed/performed Overall Cognitive Status: History of cognitive impairments - at baseline                             Awareness: Intellectual             General Comments General comments (skin integrity, edema, etc.): Pt on RA on entry SaO2 92%O2, with ambulation dropped to 75%O2, able to ambulate back to room on 2L via  and maintain SaO2 >90%      Pertinent Vitals/Pain Pain Assessment: No/denies pain           PT Goals (current goals can now be found in the care plan section) Acute Rehab PT Goals Patient Stated Goal: Get a place to stay. PT Goal Formulation: With patient Time For Goal Achievement: 12/25/18 Potential to Achieve Goals: Fair    Frequency    Min 2X/week      PT Plan Current plan remains appropriate       AM-PAC PT "6 Clicks" Mobility   Outcome Measure  Help needed turning from your back to your side while in a flat bed without using bedrails?: A Little Help needed moving from lying on your back to sitting on the side of a flat bed without using bedrails?: A Little Help needed moving to and from a bed to a chair (  including a wheelchair)?: A Little Help needed standing up from a chair using your arms (e.g., wheelchair or bedside chair)?: A Little Help needed to walk in hospital room?: A Little Help needed climbing 3-5 steps with a railing? : A Lot 6 Click Score: 17    End of Session Equipment Utilized During Treatment: Gait belt;Oxygen Activity Tolerance: Patient tolerated treatment well Patient left: in bed;with call bell/phone within reach Nurse Communication: Mobility status PT Visit Diagnosis: Unsteadiness on feet (R26.81);Other abnormalities of gait and mobility (R26.89);Difficulty in walking, not elsewhere classified  (R26.2);Pain Pain - part of body: (legs/trunk)     Time: [id number] PT Time Calculation (min) (ACUTE ONLY): 18 min  Charges:  $Gait Training: 8-22 mins                     Chealsey Miyamoto B. Beverely RisenVan Fleet PT, DPT Acute Rehabilitation Services Pager [phone] Office [phone]    Elon Alaslizabeth B Van Fleet 12/14/2018, 4:28 PM

## 2018-12-14 NOTE — Progress Notes (Addendum)
PROGRESS NOTE    [name]  ZOX:[id number] DOB: 1960/12/08 DOA: 12/10/2018 PCP: Patient, No Pcp Per   Brief Narrative: 58 y.o.malewith medical history significant ofhomelessness, hypertension, hyperlipidemia, diabetes mellitus, polysubstance abuse, tobacco abuse, cocaine abuse, depression, anxiety, HCV,[name] with EF of 25-30%, schizophrenia, AAA, who presents with shortness of breath.  Patient was recently hospitalized from 6/5-6/11 due to CHF exacerbation. Patient was just discharged yesterday. It appears SNF was recommended however it was not pursued since the patient has a hx of leaving AMA and has outbursts at staff.Pt was provided with wheelchair upondischarge,but it was left on the side of the road. Hestates that he has a worsening shortness of breath and a mild dry cough. Denies chest pain, fever or chills. He has bilateral leg edema. He also reports intermittent nausea, vomiting, diarrhea and abdominal pain. He states that he had approximately 8 times of loose stool bowel movement and vomited once. His abdominal pain is diffuse, moderate, constant nonradiating. Denies symptoms of UTI or unilateral weakness. Of note,previous admission,psych evaluated pt and they determined he has capacity to make his own decisions.  ED Course:pt was found to have BNP 949.9, pending COVID-19 test, creatinine 1.03, BUN 29, temperature normal, tachycardia, oxygen desaturated to 86% on room air, which improved to 94% on 3 L nasal cannula oxygen. Chest x-ray showed pulmonary edema and cardiomegaly. Patient is placed on telemetry bed for observation Assessment & Plan:   Principal Problem:   Acute on chronic systolic (congestive) heart failure (HCC) Active Problems:   Essential hypertension, benign   Cocaine use disorder, severe, dependence (HCC)   Chronic paranoid schizophrenia (HCC)   Tobacco use   Acute respiratory failure with hypoxia (HCC)   MDD (major depressive disorder),  severe (HCC)   Diabetes mellitus without complication (HCC)   Abdominal pain   Nausea vomiting and diarrhea   Anemia  Acute respiratory failure with hypoxiadue to acute on chronic systolicCHF:Patient presented with elevated BNP 949, positive JVD and bilateral leg edema. Chest x-ray showed pulmonary edema, consistent with CHF exacerbation.Patient had 2D echo on 11/07/2018 which showed EF of 25-30%. Pt is homeless. Hedoes not seem to be able to take care of himself medically. Will need placement.He is negative by 1.6 L after changing his Lasix to IV.  He still has a lot of fluid to come out continue fluid restriction.  Creatinine did bump up appropriately with the use of Lasix.  Monitor closely   HTN: Still needs better control of his high blood pressure increase Coreg. -Continue home medications:Coreg, lisinopril -Patient is on IV Lasix -IV hydralazine prn  Cocaine use disorder, severe, dependenceandTobacco use: -Did counseling about importance of quitting substance use -Nicotine patch  Chronic paranoid schizophreniaandMDD (major depressive disorder), severe (HCC):No SI or HI. -continue homeAbilify and Tegretol  Type II diabetes mellitus without complication (HCC):Last A1c9.7 on 12/04/18, poorly controled. Patient is takingmetforminat home -SSI Blood sugar remains elevated I will start him on glipizide 2.5 mg daily he may not be able to go back on metformin in view of CKD and the fact that he is also on ACE inhibitor with Lasix which probably will cause increasing creatinine.  Abdominal pain,Nausea vomiting and diarrhea:Resolved.   Anemia of chronic diseaseHgb stable, 9.4 on 12/08/18-->9   DVT ppx: SQ Lovenox Code Status:Full code (I discussed with the patient about CODE STATUS, and explained the meaning of CODE STATUS, he wants to be full code today) Family Communication: He did not wanted me to speak with anyone from the  family he has a daughter.  Disposition Plan: Anticipate discharge SNF Consults called:none    Estimated body mass index is 34.39 kg/m as calculated from the following:   Height as of this encounter: 5\' 9"  (1.753 m).   Weight as of this encounter: 105.6 kg.    Subjective: Resting in bed took of the oxygen and sleep but awake alert  Objective: Vitals:   12/13/18 1346 12/13/18 2100 12/14/18 0415 12/14/18 0838  BP: (!) 151/95 (!) 156/92 (!) 151/90 (!) 165/89  Pulse: 90 78    Resp:      Temp: 98.4 F (36.9 C) 98.1 F (36.7 C) 98.7 F (37.1 C)   TempSrc: Oral  Oral   SpO2: 92% 94%    Weight:   105.6 kg   Height:        Intake/Output Summary (Last 24 hours) at 12/14/2018 0957 Last data filed at 12/13/2018 2200 Gross per 24 hour  Intake 720 ml  Output 1601 ml  Net -881 ml   Filed Weights   12/11/18 0357 12/13/18 0606 12/14/18 0415  Weight: 112.8 kg 115.5 kg 105.6 kg    Examination:  General exam: Appears calm and comfortable  Respiratory system: Crackles at the bases bilaterally to auscultation. Respiratory effort normal. Cardiovascular system: S1 & S2 heard, RRR. No JVD, murmurs, rubs, gallops or clicks. No pedal edema. Gastrointestinal system: Abdomen is distended, soft and nontender. No organomegaly or masses felt. Normal bowel sounds heard. Central nervous system: Alert and oriented. No focal neurological deficits. Extremities: 2+ pitting edema Skin: No rashes, lesions or ulcers Psychiatry: Judgement and insight appear normal. Mood & affect appropriate.     Data Reviewed: I have personally reviewed following labs and imaging studies  CBC: Recent Labs  Lab 12/08/18 0434 12/10/18 0343 12/10/18 2205  WBC 7.5 6.7 9.6  HGB 9.4* 9.0* 9.4*  HCT 31.5* 30.4* 32.1*  MCV 78.9* 79.2* 79.5*  PLT 275 234 469   Basic Metabolic Panel: Recent Labs  Lab 12/08/18 0434 12/10/18 0343 12/10/18 2205 12/12/18 0352 12/13/18 0635 12/14/18 0344  NA 141 141 141 139 138 139  K 3.5 3.9 3.8 3.8  4.2 4.3  CL 99 99 99 97* 98 101  CO2 34* 34* 33* 32 31 32  GLUCOSE 202* 229* 182* 142* 120* 146*  BUN 25* 25* 27* 22* 19 22*  CREATININE 1.11 1.03 1.03 0.93 0.96 1.07  CALCIUM 8.9 8.7* 8.8* 8.9 8.9 9.0  MG 1.9  --   --   --   --   --    GFR: Estimated Creatinine Clearance: 91.3 mL/min (by C-G formula based on SCr of 1.07 mg/dL). Liver Function Tests: No results for input(s): AST, ALT, ALKPHOS, BILITOT, PROT, ALBUMIN in the last 168 hours. Recent Labs  Lab 12/11/18 0407  LIPASE 25   No results for input(s): AMMONIA in the last 168 hours. Coagulation Profile: No results for input(s): INR, PROTIME in the last 168 hours. Cardiac Enzymes: No results for input(s): CKTOTAL, CKMB, CKMBINDEX, TROPONINI in the last 168 hours. BNP (last 3 results) No results for input(s): PROBNP in the last 8760 hours. HbA1C: No results for input(s): HGBA1C in the last 72 hours. CBG: Recent Labs  Lab 12/13/18 0723 12/13/18 1111 12/13/18 1639 12/13/18 2147 12/14/18 0728  GLUCAP 126* 210* 201* 273* 146*   Lipid Profile: No results for input(s): CHOL, HDL, LDLCALC, TRIG, CHOLHDL, LDLDIRECT in the last 72 hours. Thyroid Function Tests: No results for input(s): TSH, T4TOTAL, FREET4, T3FREE, THYROIDAB in the  last 72 hours. Anemia Panel: No results for input(s): VITAMINB12, FOLATE, FERRITIN, TIBC, IRON, RETICCTPCT in the last 72 hours. Sepsis Labs: No results for input(s): PROCALCITON, LATICACIDVEN in the last 168 hours.  Recent Results (from the past 240 hour(s))  SARS Coronavirus 2     Status: None   Collection Time: 12/04/18 10:53 AM  Result Value Ref Range Status   SARS Coronavirus 2 NOT DETECTED NOT DETECTED Final    Comment: (NOTE) SARS-CoV-2 target nucleic acids are NOT DETECTED. The SARS-CoV-2 RNA is generally detectable in upper and lower respiratory specimens during the acute phase of infection.  Negative  results do not preclude SARS-CoV-2 infection, do not rule out co-infections with  other pathogens, and should not be used as the sole basis for treatment or other patient management decisions.  Negative results must be combined with clinical observations, patient history, and epidemiological information. The expected result is Not Detected. Fact Sheet for Patients: http://www.biofiredefense.com/wp-content/uploads/2020/03/BIOFIRE-COVID -19-patients.pdf Fact Sheet for Healthcare Providers: http://www.biofiredefense.com/wp-content/uploads/2020/03/BIOFIRE-COVID -19-hcp.pdf This test is not yet approved or cleared by the [place] FDA and  has been authorized for detection and/or diagnosis of SARS-CoV-2 by FDA under an Emergency Use Authorization (EUA).  This EUA will remain in effec t (meaning this test can be used) for the duration of  the COVID-19 declaration under Section 564(b)(1) of the Act, 21 U.S.C. section 360bbb-3(b)(1), unless the authorization is terminated or revoked sooner. Performed at [facility], [address] [place], [place] [address]   MRSA PCR Screening     Status: Abnormal   Collection Time: 12/07/18 12:01 PM   Specimen: Nasopharyngeal  Result Value Ref Range Status   MRSA by PCR POSITIVE (A) NEGATIVE Final    Comment:        The GeneXpert MRSA Assay (FDA approved for NASAL specimens only), is one component of a comprehensive MRSA colonization surveillance program. It is not intended to diagnose MRSA infection nor to guide or monitor treatment for MRSA infections. CRITICAL RESULT CALLED TO, READ BACK BY AND VERIFIED WITH: RN GRACE SISON [id number] [phone] FCP Performed at [facility], [address] [place], [place] [address]   SARS Coronavirus 2     Status: None   Collection Time: 12/11/18 12:52 AM  Result Value Ref Range Status   SARS Coronavirus 2 NOT DETECTED NOT DETECTED Final    Comment: (NOTE) SARS-CoV-2 target nucleic acids are NOT DETECTED. The SARS-CoV-2 RNA is generally detectable in upper and lower  respiratory specimens during the acute phase of infection.  Negative  results do not preclude SARS-CoV-2 infection, do not rule out co-infections with other pathogens, and should not be used as the sole basis for treatment or other patient management decisions.  Negative results must be combined with clinical observations, patient history, and epidemiological information. The expected result is Not Detected. Fact Sheet for Patients: http://www.biofiredefense.com/wp-content/uploads/2020/03/BIOFIRE-COVID -19-patients.pdf Fact Sheet for Healthcare Providers: http://www.biofiredefense.com/wp-content/uploads/2020/03/BIOFIRE-COVID -19-hcp.pdf This test is not yet approved or cleared by the [place] FDA and  has been authorized for detection and/or diagnosis of SARS-CoV-2 by FDA under an Emergency Use Authorization (EUA).  This EUA will remain in effec t (meaning this test can be used) for the duration of  the COVID-19 declaration under Section 564(b)(1) of the Act, 21 U.S.C. section 360bbb-3(b)(1), unless the authorization is terminated or revoked sooner. Performed at [facility], [address] [place], [place] [address]          Radiology Studies: Dg Chest 1  View  Result Date: 12/13/2018 CLINICAL DATA:  Reason for exam: Hypoxemia. Pt SOB at bedside, per pt SOB x 6 months. Hx of CHF, diabetes, HTN. EXAM: CHEST  1 VIEW COMPARISON:  12/10/2018 FINDINGS: Enlarged cardiac silhouette. No infiltrate or pneumothorax. Mild central venous pulmonary congestion. Small bilateral pleural effusions. No pneumothorax. IMPRESSION: Cardiomegaly, central venous congestion and small effusions. Findings suggest vascular overload. Electronically Signed   By: Genevive BiStewart  Edmunds M.D.   On: 12/13/2018 14:01        Scheduled Meds: . ARIPiprazole  10 mg Oral Daily  . aspirin EC  81 mg Oral Daily  . carbamazepine  100 mg Oral BID  . carvedilol  3.125 mg Oral BID WC  . enoxaparin (LOVENOX)  injection  40 mg Subcutaneous Q24H  . furosemide  40 mg Intravenous BID  . hydrocortisone  25 mg Rectal BID  . insulin aspart  0-5 Units Subcutaneous QHS  . insulin aspart  0-9 Units Subcutaneous TID WC  . insulin glargine  15 Units Subcutaneous QHS  . lisinopril  10 mg Oral Daily  . nicotine  21 mg Transdermal Daily  . potassium chloride SA  20 mEq Oral BID  . sodium chloride flush  3 mL Intravenous Q12H   Continuous Infusions: . sodium chloride       LOS: 2 days     Alwyn RenElizabeth G Mathews, MD [facility]  If 7PM-7AM, please contact night-coverage [email or web address] Password [email or web address] 12/14/2018, 9:57 AM

## 2018-12-15 LAB — BASIC METABOLIC PANEL
Anion gap: 8 (ref 5–15)
BUN: 21 mg/dL — ABNORMAL HIGH (ref 6–20)
CO2: 31 mmol/L (ref 22–32)
Calcium: 8.9 mg/dL (ref 8.9–10.3)
Chloride: 99 mmol/L (ref 98–111)
Creatinine, Ser: 0.91 mg/dL (ref 0.61–1.24)
GFR calc Af Amer: >60 mL/min (ref >60)
GFR calc non Af Amer: >60 mL/min (ref >60)
Glucose, Bld: 193 mg/dL — ABNORMAL HIGH (ref 70–99)
Potassium: 4.1 mmol/L (ref 3.5–5.1)
Sodium: 138 mmol/L (ref 135–145)

## 2018-12-15 LAB — GLUCOSE, CAPILLARY: Glucose-Capillary: 191 mg/dL — ABNORMAL HIGH (ref 70–99)

## 2018-12-15 NOTE — Progress Notes (Signed)
Pt has called RN twice within the hour wanted to leave. MD notified. Pt said he's homeless but he would rather on the street in the rain instead of here. Pt also stated something about getting drugs. Pt was extremely adamet about leaving. RN tried to reason w pt but pt refused to stay. AMA paper signed. Pt's IV & tele removed. All belongings gathered & sent w pt. Hoover Brunette, RN

## 2018-12-15 NOTE — Discharge Summary (Signed)
Patient left AMA without being seen. 

## 2018-12-15 NOTE — Progress Notes (Signed)
Pt.upset when sprite was only given with no ice so explained to him he is on fluid restriction & he will not listen & he  wants to go home AMA .Explained to him the risks of going home AMA but insisted to go home AMA.Charge nurse [place] talked to pt.too &  He said that he's been here in the hosp for  4days & nobody  found a place for him so he insisted to sign the AMA papers.MD on call Schorr was called & made her aware of pt's wanted to go home  AMA.Schorr spoke to him on the phone & asked  him if somebody can pick him up & said nobody. .Schorr explained to him that its.raining & cold outside & it is dangerous to go out at this time & so he agreed to stay & he asked if he can have something for sleep.Schorr order Ativan iv for sleep.

## 2018-12-16 ENCOUNTER — Encounter (HOSPITAL_COMMUNITY): Payer: Self-pay

## 2018-12-16 ENCOUNTER — Emergency Department (HOSPITAL_COMMUNITY): Payer: Medicare Other

## 2018-12-16 ENCOUNTER — Inpatient Hospital Stay (HOSPITAL_COMMUNITY)
Admission: EM | Admit: 2018-12-16 | Discharge: 2018-12-18 | DRG: 291 | Disposition: A | Discharge status: Home / Self Care | Payer: Medicare Other | Attending: Internal Medicine | Admitting: Internal Medicine

## 2018-12-16 ENCOUNTER — Other Ambulatory Visit: Payer: Self-pay

## 2018-12-16 DIAGNOSIS — G629 Polyneuropathy, unspecified: Secondary | ICD-10-CM | POA: Diagnosis present

## 2018-12-16 DIAGNOSIS — Z794 Long term (current) use of insulin: Secondary | ICD-10-CM

## 2018-12-16 DIAGNOSIS — B1920 Unspecified viral hepatitis C without hepatic coma: Secondary | ICD-10-CM | POA: Diagnosis present

## 2018-12-16 DIAGNOSIS — Z6837 Body mass index (BMI) 37.0-37.9, adult: Secondary | ICD-10-CM | POA: Diagnosis not present

## 2018-12-16 DIAGNOSIS — I5043 Acute on chronic combined systolic (congestive) and diastolic (congestive) heart failure: Secondary | ICD-10-CM | POA: Diagnosis present

## 2018-12-16 DIAGNOSIS — Z833 Family history of diabetes mellitus: Secondary | ICD-10-CM

## 2018-12-16 DIAGNOSIS — Z720 Tobacco use: Secondary | ICD-10-CM | POA: Diagnosis not present

## 2018-12-16 DIAGNOSIS — I110 Hypertensive heart disease with heart failure: Secondary | ICD-10-CM | POA: Diagnosis present

## 2018-12-16 DIAGNOSIS — F17210 Nicotine dependence, cigarettes, uncomplicated: Secondary | ICD-10-CM | POA: Diagnosis present

## 2018-12-16 DIAGNOSIS — D649 Anemia, unspecified: Secondary | ICD-10-CM

## 2018-12-16 DIAGNOSIS — I10 Essential (primary) hypertension: Secondary | ICD-10-CM | POA: Diagnosis not present

## 2018-12-16 DIAGNOSIS — Z885 Allergy status to narcotic agent status: Secondary | ICD-10-CM

## 2018-12-16 DIAGNOSIS — E1165 Type 2 diabetes mellitus with hyperglycemia: Secondary | ICD-10-CM | POA: Diagnosis present

## 2018-12-16 DIAGNOSIS — J9601 Acute respiratory failure with hypoxia: Secondary | ICD-10-CM | POA: Diagnosis present

## 2018-12-16 DIAGNOSIS — F410 Panic disorder [episodic paroxysmal anxiety] without agoraphobia: Secondary | ICD-10-CM | POA: Diagnosis present

## 2018-12-16 DIAGNOSIS — Z7984 Long term (current) use of oral hypoglycemic drugs: Secondary | ICD-10-CM

## 2018-12-16 DIAGNOSIS — G4733 Obstructive sleep apnea (adult) (pediatric): Secondary | ICD-10-CM | POA: Diagnosis present

## 2018-12-16 DIAGNOSIS — Z8249 Family history of ischemic heart disease and other diseases of the circulatory system: Secondary | ICD-10-CM | POA: Diagnosis not present

## 2018-12-16 DIAGNOSIS — Z7151 Drug abuse counseling and surveillance of drug abuser: Secondary | ICD-10-CM | POA: Diagnosis not present

## 2018-12-16 DIAGNOSIS — F209 Schizophrenia, unspecified: Secondary | ICD-10-CM | POA: Diagnosis present

## 2018-12-16 DIAGNOSIS — Z7951 Long term (current) use of inhaled steroids: Secondary | ICD-10-CM

## 2018-12-16 DIAGNOSIS — Z9119 Patient's noncompliance with other medical treatment and regimen: Secondary | ICD-10-CM

## 2018-12-16 DIAGNOSIS — E669 Obesity, unspecified: Secondary | ICD-10-CM | POA: Diagnosis present

## 2018-12-16 DIAGNOSIS — E119 Type 2 diabetes mellitus without complications: Secondary | ICD-10-CM

## 2018-12-16 DIAGNOSIS — N492 Inflammatory disorders of scrotum: Secondary | ICD-10-CM | POA: Diagnosis present

## 2018-12-16 DIAGNOSIS — Z716 Tobacco abuse counseling: Secondary | ICD-10-CM | POA: Diagnosis not present

## 2018-12-16 DIAGNOSIS — D509 Iron deficiency anemia, unspecified: Secondary | ICD-10-CM | POA: Diagnosis present

## 2018-12-16 DIAGNOSIS — F1420 Cocaine dependence, uncomplicated: Secondary | ICD-10-CM | POA: Diagnosis present

## 2018-12-16 DIAGNOSIS — I248 Other forms of acute ischemic heart disease: Secondary | ICD-10-CM | POA: Diagnosis not present

## 2018-12-16 DIAGNOSIS — R7989 Other specified abnormal findings of blood chemistry: Secondary | ICD-10-CM | POA: Diagnosis not present

## 2018-12-16 DIAGNOSIS — I509 Heart failure, unspecified: Secondary | ICD-10-CM | POA: Insufficient documentation

## 2018-12-16 DIAGNOSIS — Z5900 Homelessness unspecified: Secondary | ICD-10-CM

## 2018-12-16 DIAGNOSIS — Z79899 Other long term (current) drug therapy: Secondary | ICD-10-CM

## 2018-12-16 DIAGNOSIS — R0902 Hypoxemia: Secondary | ICD-10-CM | POA: Diagnosis present

## 2018-12-16 DIAGNOSIS — Z20828 Contact with and (suspected) exposure to other viral communicable diseases: Secondary | ICD-10-CM | POA: Diagnosis present

## 2018-12-16 DIAGNOSIS — I5023 Acute on chronic systolic (congestive) heart failure: Secondary | ICD-10-CM | POA: Diagnosis not present

## 2018-12-16 LAB — CBC WITH DIFFERENTIAL/PLATELET
Abs Immature Granulocytes: 0.05 10*3/uL (ref 0.00–0.07)
Basophils Absolute: 0 10*3/uL (ref 0.0–0.1)
Basophils Relative: 0 %
Eosinophils Absolute: 0 10*3/uL (ref 0.0–0.5)
Eosinophils Relative: 0 %
HCT: 31.6 % — ABNORMAL LOW (ref 39.0–52.0)
Hemoglobin: 9 g/dL — ABNORMAL LOW (ref 13.0–17.0)
Immature Granulocytes: 0 %
Lymphocytes Relative: 6 %
Lymphs Abs: 0.7 10*3/uL (ref 0.7–4.0)
MCH: 23.2 pg — ABNORMAL LOW (ref 26.0–34.0)
MCHC: 28.5 g/dL — ABNORMAL LOW (ref 30.0–36.0)
MCV: 81.4 fL (ref 80.0–100.0)
Monocytes Absolute: 0.8 10*3/uL (ref 0.1–1.0)
Monocytes Relative: 6 %
Neutro Abs: 10.5 10*3/uL — ABNORMAL HIGH (ref 1.7–7.7)
Neutrophils Relative %: 88 %
Platelets: 213 10*3/uL (ref 150–400)
RBC: 3.88 MIL/uL — ABNORMAL LOW (ref 4.22–5.81)
RDW: 21.4 % — ABNORMAL HIGH (ref 11.5–15.5)
WBC: 12 10*3/uL — ABNORMAL HIGH (ref 4.0–10.5)
nRBC: 0 % (ref 0.0–0.2)

## 2018-12-16 LAB — TROPONIN I
Troponin I: 0.03 ng/mL (ref <0.03)
Troponin I: 0.04 ng/mL (ref <0.03)
Troponin I: 0.03 ng/mL (ref <0.03)
Troponin I: 0.03 ng/mL (ref <0.03)

## 2018-12-16 LAB — BASIC METABOLIC PANEL
Anion gap: 11 (ref 5–15)
BUN: 29 mg/dL — ABNORMAL HIGH (ref 6–20)
CO2: 27 mmol/L (ref 22–32)
Calcium: 8.9 mg/dL (ref 8.9–10.3)
Chloride: 100 mmol/L (ref 98–111)
Creatinine, Ser: 1.17 mg/dL (ref 0.61–1.24)
GFR calc Af Amer: >60 mL/min (ref >60)
GFR calc non Af Amer: >60 mL/min (ref >60)
Glucose, Bld: 272 mg/dL — ABNORMAL HIGH (ref 70–99)
Potassium: 4.4 mmol/L (ref 3.5–5.1)
Sodium: 138 mmol/L (ref 135–145)

## 2018-12-16 LAB — URINALYSIS, ROUTINE W REFLEX MICROSCOPIC
Bilirubin Urine: NEGATIVE
Glucose, UA: 50 mg/dL — AB
Ketones, ur: NEGATIVE mg/dL
Leukocytes,Ua: NEGATIVE
Nitrite: NEGATIVE
Protein, ur: 30 mg/dL — AB
Specific Gravity, Urine: 1.01 (ref 1.005–1.030)
pH: 6 (ref 5.0–8.0)

## 2018-12-16 LAB — RAPID URINE DRUG SCREEN, HOSP PERFORMED
Amphetamines: NOT DETECTED
Barbiturates: NOT DETECTED
Benzodiazepines: NOT DETECTED
Cocaine: POSITIVE — AB
Opiates: NOT DETECTED
Tetrahydrocannabinol: POSITIVE — AB

## 2018-12-16 LAB — GLUCOSE, CAPILLARY
Glucose-Capillary: 209 mg/dL — ABNORMAL HIGH (ref 70–99)
Glucose-Capillary: 221 mg/dL — ABNORMAL HIGH (ref 70–99)
Glucose-Capillary: 169 mg/dL — ABNORMAL HIGH (ref 70–99)
Glucose-Capillary: 182 mg/dL — ABNORMAL HIGH (ref 70–99)

## 2018-12-16 LAB — SARS CORONAVIRUS 2: SARS Coronavirus 2: NOT DETECTED

## 2018-12-16 LAB — C-REACTIVE PROTEIN: CRP: 6.3 mg/dL — ABNORMAL HIGH (ref <1.0)

## 2018-12-16 LAB — BRAIN NATRIURETIC PEPTIDE: B Natriuretic Peptide: 1591.9 pg/mL — ABNORMAL HIGH (ref 0.0–100.0)

## 2018-12-16 MED ORDER — FUROSEMIDE 10 MG/ML IJ SOLN
40.0000 mg | Freq: Two times a day (BID) | INTRAMUSCULAR | Status: DC
  Administered 2018-12-16 – 2018-12-17 (×3): 40 mg via INTRAVENOUS
  Filled 2018-12-16 (×4): qty 4

## 2018-12-16 MED ORDER — ACETAMINOPHEN 325 MG PO TABS
650.0000 mg | ORAL_TABLET | Freq: Four times a day (QID) | ORAL | Status: DC | PRN
  Administered 2018-12-16 – 2018-12-18 (×7): 650 mg via ORAL
  Filled 2018-12-16 (×8): qty 2

## 2018-12-16 MED ORDER — CARBAMAZEPINE 200 MG PO TABS
100.0000 mg | ORAL_TABLET | Freq: Two times a day (BID) | ORAL | Status: DC
  Administered 2018-12-16 – 2018-12-18 (×5): 100 mg via ORAL
  Filled 2018-12-16 (×5): qty 0.5

## 2018-12-16 MED ORDER — ACETAMINOPHEN 500 MG PO TABS
1000.0000 mg | ORAL_TABLET | Freq: Once | ORAL | Status: AC
  Administered 2018-12-16: 04:00:00 1000 mg via ORAL
  Filled 2018-12-16: qty 2

## 2018-12-16 MED ORDER — ONDANSETRON HCL 4 MG/2ML IJ SOLN: 4.0000 mg | Freq: Four times a day (QID) | INTRAMUSCULAR | Status: DC | PRN

## 2018-12-16 MED ORDER — ORAL CARE MOUTH RINSE
15.0000 mL | Freq: Two times a day (BID) | OROMUCOSAL | Status: DC
  Administered 2018-12-17 – 2018-12-18 (×2): 15 mL via OROMUCOSAL

## 2018-12-16 MED ORDER — CEPHALEXIN 250 MG PO CAPS
500.0000 mg | ORAL_CAPSULE | Freq: Three times a day (TID) | ORAL | Status: DC
  Administered 2018-12-16 – 2018-12-18 (×6): 500 mg via ORAL
  Filled 2018-12-16 (×6): qty 2

## 2018-12-16 MED ORDER — ENOXAPARIN SODIUM 40 MG/0.4ML ~~LOC~~ SOLN
40.0000 mg | SUBCUTANEOUS | Status: DC
  Administered 2018-12-16 – 2018-12-17 (×2): 40 mg via SUBCUTANEOUS
  Filled 2018-12-16 (×3): qty 0.4

## 2018-12-16 MED ORDER — INSULIN ASPART 100 UNIT/ML ~~LOC~~ SOLN
0.0000 [IU] | Freq: Three times a day (TID) | SUBCUTANEOUS | Status: DC
  Administered 2018-12-16: 5 [IU] via SUBCUTANEOUS
  Administered 2018-12-16 – 2018-12-17 (×4): 3 [IU] via SUBCUTANEOUS
  Administered 2018-12-18: 5 [IU] via SUBCUTANEOUS

## 2018-12-16 MED ORDER — FUROSEMIDE 10 MG/ML IJ SOLN
40.0000 mg | INTRAMUSCULAR | Status: AC
  Administered 2018-12-16: 04:00:00 40 mg via INTRAVENOUS
  Filled 2018-12-16: qty 4

## 2018-12-16 MED ORDER — SODIUM CHLORIDE 0.9% FLUSH: 3.0000 mL | INTRAVENOUS | Status: DC | PRN

## 2018-12-16 MED ORDER — INSULIN ASPART 100 UNIT/ML ~~LOC~~ SOLN: 0.0000 [IU] | Freq: Every day | SUBCUTANEOUS | Status: DC

## 2018-12-16 MED ORDER — LISINOPRIL 10 MG PO TABS
10.0000 mg | ORAL_TABLET | Freq: Every day | ORAL | Status: DC
  Administered 2018-12-16 – 2018-12-18 (×3): 10 mg via ORAL
  Filled 2018-12-16 (×3): qty 1

## 2018-12-16 MED ORDER — POTASSIUM CHLORIDE CRYS ER 20 MEQ PO TBCR
20.0000 meq | EXTENDED_RELEASE_TABLET | Freq: Two times a day (BID) | ORAL | Status: DC
  Administered 2018-12-16 – 2018-12-18 (×5): 20 meq via ORAL
  Filled 2018-12-16: qty 1
  Filled 2018-12-16: qty 2
  Filled 2018-12-16 (×3): qty 1

## 2018-12-16 MED ORDER — ARIPIPRAZOLE 5 MG PO TABS
10.0000 mg | ORAL_TABLET | Freq: Every day | ORAL | Status: DC
  Administered 2018-12-16 – 2018-12-18 (×3): 10 mg via ORAL
  Filled 2018-12-16 (×3): qty 2

## 2018-12-16 MED ORDER — HYDRALAZINE HCL 20 MG/ML IJ SOLN: 10.0000 mg | INTRAMUSCULAR | Status: DC | PRN

## 2018-12-16 MED ORDER — ACETAMINOPHEN 325 MG PO TABS: 650.0000 mg | ORAL_TABLET | ORAL | Status: DC | PRN

## 2018-12-16 MED ORDER — SODIUM CHLORIDE 0.9% FLUSH
3.0000 mL | Freq: Two times a day (BID) | INTRAVENOUS | Status: DC
  Administered 2018-12-16 – 2018-12-18 (×5): 3 mL via INTRAVENOUS

## 2018-12-16 MED ORDER — SODIUM CHLORIDE 0.9 % IV SOLN: 250.0000 mL | INTRAVENOUS | Status: DC | PRN

## 2018-12-16 NOTE — H&P (Signed)
History and Physical    [name] ZOX:[id number] DOB: 20-Dec-1960 DOA: 12/16/2018  Referring MD/NP/PA:  Midge MiniumArshad Kakrakandy, MD PCP: Patient, No Pcp Per  Patient coming from: Homeless  Chief Complaint: Shortness of breath  I have personally briefly reviewed patient's old medical records in [facility]   HPI: [name] is a 58 y.o. 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 Patient has had several ER visits, and in this month particularly has been hospitalized 3 times with congestive heart failure symptoms and had most recently left AGAINST MEDICAL ADVICE yesterday.  He complains of having shortness of breath with any kind of activity.  When he gets short of breath he gets anxious and reports having a panic attack.  Patient admits to daily cocaine use whenever he is out as he is homeless.  States that they have been trying to set him up with a place to live.  Once again he 95 pounds over last 6 months along with leg tightness.  Patient also complains of scrotal swelling for which he notes that he was previously on antibiotics, but he lost them after being discharged at some point in time.  Other associated symptoms include cough, wheezing, and abdominal distention.  ED Course: Upon admission into the emergency department patient was seen to have blood pressure 155/97-162/108, O2 saturations maintained on a nonrebreather, and all other vital signs maintained.  Labs revealed WBC 12, hemoglobin 9, BUN 29, creatinine 1.17, glucose 272, BNP 1591.9, and troponin 0.03.  COVID-19 screening negative.  Chest x-ray showing cardiomegaly with interstitial edema and bilateral pleural effusions.  Patient was given 40 mg of Lasix IV.  TRH called to admit.  Accepted as inpatient to a telemetry bed.  Review of Systems  Constitutional:  Positive for malaise/fatigue. Negative for fever.  HENT: Negative for hearing loss and nosebleeds.   Eyes: Negative for photophobia and pain.  Respiratory: Positive for cough, shortness of breath and wheezing.   Cardiovascular: Positive for leg swelling.  Gastrointestinal: Negative for abdominal pain, nausea and vomiting.  Genitourinary: Negative for hematuria.       Positive for scrotal swelling  Musculoskeletal: Negative for falls.  Neurological: Negative for focal weakness and loss of consciousness.  Psychiatric/Behavioral: Positive for substance abuse. The patient is nervous/anxious.     Past Medical History:  Diagnosis Date   Chronic foot pain    Cocaine abuse (HCC)    Diabetes mellitus without complication (HCC)    Hepatitis C    unsure    Homelessness    Hypertension    Neuropathy    Polysubstance abuse (HCC)    Schizophrenia (HCC)    Sleep apnea    Systolic and diastolic CHF, chronic (HCC)     Past Surgical History:  Procedure Laterality Date   MULTIPLE TOOTH EXTRACTIONS       reports that he has been smoking cigarettes. He has a 20.00 pack-year smoking history. He uses smokeless tobacco. He reports current alcohol use. He reports current drug use. Frequency: 7.00 times per week. Drugs: "Crack" cocaine, Cocaine, and Marijuana.  Allergies  Allergen Reactions   Haldol [Haloperidol] Other (See Comments)    Muscle spasms, loss of voluntary movement. However, pt has taken Thorazine on multiple occasions with no adverse effects.     Family History  Problem Relation Age of Onset   Hypertension Other  Diabetes Other     Prior to Admission medications   Medication Sig Start Date End Date Taking? Authorizing Provider  ARIPiprazole (ABILIFY) 10 MG tablet Take 1 tablet (10 mg total) by mouth daily. 12/10/18  Yes Pokhrel, Laxman, MD  carbamazepine (TEGRETOL) 200 MG tablet Take 0.5 tablets (100 mg total) by mouth 2 (two) times daily. 11/26/18  Yes Mancel BaleWentz,  Elliott, MD  carvedilol (COREG) 6.25 MG tablet Take 1 tablet (6.25 mg total) by mouth 2 (two) times daily with a meal. 11/26/18  Yes Mancel BaleWentz, Elliott, MD  furosemide (LASIX) 40 MG tablet Take 1 tablet (40 mg total) by mouth 2 (two) times daily for 30 days. 11/26/18 12/26/18 Yes Mancel BaleWentz, Elliott, MD  Ipratropium-Albuterol (COMBIVENT) 20-100 MCG/ACT AERS respimat Inhale 1 puff into the lungs every 6 (six) hours. 11/30/18  Yes Palumbo, April, MD  lisinopril (ZESTRIL) 10 MG tablet Take 1 tablet (10 mg total) by mouth daily. 11/26/18  Yes Mancel BaleWentz, Elliott, MD  metFORMIN (GLUCOPHAGE) 500 MG tablet Take 1 tablet (500 mg total) by mouth 2 (two) times daily with a meal. 12/10/18 12/10/19 Yes Pokhrel, Laxman, MD  potassium chloride SA (K-DUR) 20 MEQ tablet Take 1 tablet (20 mEq total) by mouth 2 (two) times daily. 11/30/18  Yes Palumbo, April, MD    Physical Exam:  Constitutional: Obese male who appears older than stated age Vitals:   12/16/18 0645 12/16/18 0700 12/16/18 0715 12/16/18 0730  BP: (!) 166/105 (!) 162/108 (!) 155/97 (!) 160/96  Pulse: 94 93 92 89  Resp: 18 20 19 17   SpO2: 100% 100% 100% 94%   Eyes: PERRL, lids and conjunctivae normal ENMT: Mucous membranes are moist. Posterior pharynx clear of any exudate or lesions. Poor dentition Neck: normal, supple, no masses, no thyromegaly Respiratory: Positive crackles appreciated in both lung fields.  Patient on 2 L of nasal cannula oxygen currently. Cardiovascular: Regular rate and rhythm, no murmurs / rubs / gallops.  2+ pitting bilateral lower extremity edema. 2+ pedal pulses. No carotid bruits.  Abdomen: no tenderness, no masses palpated. No hepatosplenomegaly. Bowel sounds positive.  Genitourinary: Scrotal edema appreciated Musculoskeletal: Clubbing present.  No joint deformity upper and lower extremities. Good ROM, no contractures. Normal muscle tone.  Skin: no rashes, lesions, ulcers. No induration Neurologic: CN 2-12 grossly intact. Sensation intact,  DTR normal. Strength 5/5 in all 4.  Psychiatric: Poor judgment and insight. Alert and oriented x 3.    Labs on Admission: I have personally reviewed following labs and imaging studies  CBC: Recent Labs  Lab 12/10/18 0343 12/10/18 2205 12/16/18 0415  WBC 6.7 9.6 12.0*  NEUTROABS  --   --  10.5*  HGB 9.0* 9.4* 9.0*  HCT 30.4* 32.1* 31.6*  MCV 79.2* 79.5* 81.4  PLT 234 241 213   Basic Metabolic Panel: Recent Labs  Lab 12/12/18 0352 12/13/18 0635 12/14/18 0344 12/15/18 0434 12/16/18 0415  NA 139 138 139 138 138  K 3.8 4.2 4.3 4.1 4.4  CL 97* 98 101 99 100  CO2 32 31 32 31 27  GLUCOSE 142* 120* 146* 193* 272*  BUN 22* 19 22* 21* 29*  CREATININE 0.93 0.96 1.07 0.91 1.17  CALCIUM 8.9 8.9 9.0 8.9 8.9   GFR: Estimated Creatinine Clearance: 86.8 mL/min (by C-G formula based on SCr of 1.17 mg/dL). Liver Function Tests: No results for input(s): AST, ALT, ALKPHOS, BILITOT, PROT, ALBUMIN in the last 168 hours. Recent Labs  Lab 12/11/18 0407  LIPASE 25   No results for input(s): AMMONIA  in the last 168 hours. Coagulation Profile: No results for input(s): INR, PROTIME in the last 168 hours. Cardiac Enzymes: Recent Labs  Lab 12/16/18 0415  TROPONINI 0.03*   BNP (last 3 results) No results for input(s): PROBNP in the last 8760 hours. HbA1C: No results for input(s): HGBA1C in the last 72 hours. CBG: Recent Labs  Lab 12/14/18 0728 12/14/18 1137 12/14/18 1641 12/14/18 2309 12/15/18 0713  GLUCAP 146* 221* 210* 367* 191*   Lipid Profile: No results for input(s): CHOL, HDL, LDLCALC, TRIG, CHOLHDL, LDLDIRECT in the last 72 hours. Thyroid Function Tests: No results for input(s): TSH, T4TOTAL, FREET4, T3FREE, THYROIDAB in the last 72 hours. Anemia Panel: No results for input(s): VITAMINB12, FOLATE, FERRITIN, TIBC, IRON, RETICCTPCT in the last 72 hours. Urine analysis:    Component Value Date/Time   COLORURINE YELLOW 11/17/2018 0717   APPEARANCEUR CLEAR 11/17/2018  0717   LABSPEC 1.023 11/17/2018 0717   PHURINE 5.0 11/17/2018 0717   GLUCOSEU >=500 (A) 11/17/2018 0717   HGBUR NEGATIVE 11/17/2018 0717   BILIRUBINUR NEGATIVE 11/17/2018 0717   KETONESUR NEGATIVE 11/17/2018 0717   PROTEINUR 100 (A) 11/17/2018 0717   UROBILINOGEN 1.0 03/14/2015 0610   NITRITE NEGATIVE 11/17/2018 0717   LEUKOCYTESUR NEGATIVE 11/17/2018 0717   Sepsis Labs: Recent Results (from the past 240 hour(s))  MRSA PCR Screening     Status: Abnormal   Collection Time: 12/07/18 12:01 PM   Specimen: Nasopharyngeal  Result Value Ref Range Status   MRSA by PCR POSITIVE (A) NEGATIVE Final    Comment:        The GeneXpert MRSA Assay (FDA approved for NASAL specimens only), is one component of a comprehensive MRSA colonization surveillance program. It is not intended to diagnose MRSA infection nor to guide or monitor treatment for MRSA infections. CRITICAL RESULT CALLED TO, READ BACK BY AND VERIFIED WITH: RN GRACE SISON [id number] [phone] FCP Performed at [place] [address] [place], [place] [address]   SARS Coronavirus 2     Status: None   Collection Time: 12/11/18 12:52 AM  Result Value Ref Range Status   SARS Coronavirus 2 NOT DETECTED NOT DETECTED Final    Comment: (NOTE) SARS-CoV-2 target nucleic acids are NOT DETECTED. The SARS-CoV-2 RNA is generally detectable in upper and lower respiratory specimens during the acute phase of infection.  Negative  results do not preclude SARS-CoV-2 infection, do not rule out co-infections with other pathogens, and should not be used as the sole basis for treatment or other patient management decisions.  Negative results must be combined with clinical observations, patient history, and epidemiological information. The expected result is Not Detected. Fact Sheet for Patients: http://www.biofiredefense.com/wp-content/uploads/2020/03/BIOFIRE-COVID -19-patients.pdf Fact Sheet for Healthcare  Providers: http://www.biofiredefense.com/wp-content/uploads/2020/03/BIOFIRE-COVID -19-hcp.pdf This test is not yet approved or cleared by the [place] and  has been authorized for detection and/or diagnosis of SARS-CoV-2 by FDA under an Emergency Use Authorization (EUA).  This EUA will remain in effec t (meaning this test can be used) for the duration of  the COVID-19 declaration under Section 564(b)(1) of the Act, 21 U.S.C. section 360bbb-3(b)(1), unless the authorization is terminated or revoked sooner. Performed at [facility], [place] [address], [place], [place] [address]   SARS Coronavirus 2     Status: None   Collection Time: 12/16/18  4:25 AM  Result Value Ref Range Status   SARS Coronavirus 2 NOT DETECTED NOT DETECTED Final    Comment: (NOTE) SARS-CoV-2 target nucleic acids are NOT  DETECTED. The SARS-CoV-2 RNA is generally detectable in upper and lower respiratory specimens during the acute phase of infection.  Negative  results do not preclude SARS-CoV-2 infection, do not rule out co-infections with other pathogens, and should not be used as the sole basis for treatment or other patient management decisions.  Negative results must be combined with clinical observations, patient history, and epidemiological information. The expected result is Not Detected. Fact Sheet for Patients: http://www.biofiredefense.com/wp-content/uploads/2020/03/BIOFIRE-COVID -19-patients.pdf Fact Sheet for Healthcare Providers: http://www.biofiredefense.com/wp-content/uploads/2020/03/BIOFIRE-COVID -19-hcp.pdf This test is not yet approved or cleared by the [place] and  has been authorized for detection and/or diagnosis of SARS-CoV-2 by FDA under an Emergency Use Authorization (EUA).  This EUA will remain in effec t (meaning this test can be used) for the duration of  the COVID-19 declaration under Section 564(b)(1) of the Act, 21 U.S.C. section 360bbb-3(b)(1), unless the  authorization is terminated or revoked sooner. Performed at [facility], [address] [place], [place] [address]      Radiological Exams on Admission: Dg Chest Port 1 View  Result Date: 12/16/2018 CLINICAL DATA:  Shortness of breath.  Hypoxia.  Abdominal swelling. EXAM: PORTABLE CHEST 1 VIEW COMPARISON:  One-view chest x-ray 12/13/2018 FINDINGS: Heart is enlarged. Lung volumes are low. Mild diffuse edema is present. Small effusions are present. Bibasilar airspace disease is noted, left greater than right. IMPRESSION: 1. Cardiomegaly with increasing interstitial edema and bilateral pleural effusions consistent with congestive heart failure. 2. Bibasilar airspace disease likely reflects atelectasis, left greater than right. Infection is not excluded. Electronically Signed   By: Marin Roberts M.D.   On: 12/16/2018 04:37    EKG: Independently reviewed.  Sinus rhythm at 92bpm otherwise similar to previous EKGs  Assessment/Plan Acute respiratory failure secondary to combined systolic and diastolic congestive heart failure exacerbation: Acute on chronic.  Patient presents with shortness of breath.  Physical exam reveals 2+ pitting lower extremity edema and JVD chest x-ray showing cardiomegaly with pulmonary edema and bilateral pleural effusions.  BNP elevated at 1591.9 which is higher than previous values over the last 2 months.  Last EF of 25 to 30% echocardiogram on 11/07/2018.  Patient was given 40 mg of Lasix IV in the emergency department.  Complex situation with issues include mental health, polysubstance abuse, understanding, and noncompliance. - Admit to a telemetry bed - Heart failure orders set  initiated  - Continuous pulse oximetry with nasal cannula oxygen as needed to keep O2 saturations >92% - Strict I&Os and daily weights - Elevate lower extremities - Lasix 40 mg IV Bid - Reassess in a.m. and adjust diuresis as needed.   Elevated troponin: Troponin mildly elevated at  0.03 on admission which appears similar to previous values during multiple admissions to this month. -Trend cardiac troponins  Leukocytosis: Acute.  WBC elevated at 12.  Chest x-ray without clear note of infiltrate.  COVID-19 screening negative.  Suspect symptoms could be coming from scrotal cellulitis. - Add-on CRP - Recheck CBC in a.m.  Scrotal edema/cellulitis: Question possibility of underlying infection.  Patient reports previously being on antibiotics. -Follow-up CRP -Start Keflex  Hypochromic anemia: Chronic.  Hemoglobin 9 g/dL on admission which appears near patient's baseline. -Continue to monitor  Essential hypertension, uncontrolled: Blood pressures elevated up to 201/104 on admission. -Hold Coreg with possible recent use of cocaine -Continue lisinopril -Hydralazine IV as needed  Diabetes mellitus type 2, uncontrolled: Last hemoglobin A1c 9.7 on 12/04/2018.  Only reported to be on medications of metformin. -Hypoglycemic  protocol -Hold metformin -CBGs q. before meals and at bedtime with moderate SSI  Schizophrenia -Continue Abilify and Tegretol  Polysubstance abuse: Patient admits to regular cocaine and tobacco use. -Check urine drug screen  -Counseled on need of cessation of tobacco and illicit drug use  Obesity: BMI 37.21 kg/m  Homelessness: Patient with multiple admissions -Social work consult  DVT prophylaxis: Lovenox Code Status: Full Family Communication: No family present at bedside Disposition Plan: Likely discharge home in 2 to 3 days once diuresed Consults called: None Admission status: Inpatient  Clydie Braunondell A Dabid Godown MD [facility] Pager [phone]   If 7PM-7AM, please contact night-coverage [email or web address] Password [email or web address]  12/16/2018, 7:56 AM

## 2018-12-16 NOTE — ED Notes (Signed)
2+ pitting edema noted in bilateral legs

## 2018-12-16 NOTE — ED Provider Notes (Signed)
[facility] Provider Note   CSN: [id number] Arrival date & time: 12/16/18  0353     History   Chief Complaint Chief Complaint  Patient presents with  . Shortness of Breath    HPI [name] is a 58 y.o. male.     The history is provided by the patient and medical records.  Shortness of Breath Associated symptoms: chest pain      58 year old male with history of cocaine abuse, chronic foot pain, hepatitis C, diabetes, homelessness, hypertension, schizophrenia, combined systolic and diastolic CHF, presenting to the ED for shortness of breath.  States he was recently admitted to the hospital for same and barely started feeling better before they discharged him.  States he was told he is going to be placed in a nursing facility, but I discharged him anyway.  States he was told they would contact him when his "place was ready".  Reports since leaving the hospital he is gotten worse.  He states he does not have any of his medications.  He denies any cough or fever.  States he did have some chest pain today but very vague illness.  States his abdomen and genitals feel swollen again along with his legs.  States it feels "full of fluid".  Past Medical History:  Diagnosis Date  . Chronic foot pain   . Cocaine abuse (HCC)   . Diabetes mellitus without complication (HCC)   . Hepatitis C    unsure   . Homelessness   . Hypertension   . Neuropathy   . Polysubstance abuse (HCC)   . Schizophrenia (HCC)   . Sleep apnea   . Systolic and diastolic CHF, chronic [facility](HCC)     Patient Active Problem List   Diagnosis Date Noted  . Diabetes mellitus without complication (HCC) 12/11/2018  . Acute on chronic systolic (congestive) heart failure (HCC) 12/11/2018  . Abdominal pain 12/11/2018  . Nausea vomiting and diarrhea 12/11/2018  . Anemia 12/11/2018  . Evaluation by psychiatric service required   . MDD (major depressive disorder), severe (HCC)  11/25/2018  . Pressure injury of skin 11/08/2018  . Elevated troponin 10/18/2018  . HLD (hyperlipidemia) 10/18/2018  . Anxiety 10/18/2018  . CHF (congestive heart failure), NYHA class II, acute on chronic, combined (HCC) 10/18/2018  . Hypertensive urgency 10/12/2018  . Mild renal insufficiency 10/12/2018  . Cellulitis 10/12/2018  . Penile cellulitis   . Adjustment disorder with mixed disturbance of emotions and conduct   . Sleep apnea 10/03/2018  . Hypokalemia 09/17/2018  . Scrotal swelling 09/17/2018  . Urinary hesitancy   . Constipation   . Acute on chronic combined systolic and diastolic CHF (congestive heart failure) (HCC) 08/25/2018  . Acute respiratory failure with hypoxia (HCC) 08/25/2018  . Tobacco use 08/18/2018  . Homelessness 08/08/2018  . Smoker 08/08/2018  . Prostate enlargement 03/16/2018  . Aortic atherosclerosis (HCC) 03/16/2018  . 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 Chronic foot pain   . Schizoaffective disorder, bipolar type (HCC) 09/30/2016  . Substance induced mood disorder (HCC) 03/13/2015  . Schizophrenia, paranoid type (HCC) 01/17/2015  . Drug hallucinosis (HCC) 10/08/2014  . Chronic paranoid schizophrenia (HCC) 09/07/2014  . 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 Urinary retention   . Cocaine use disorder, severe, dependence (HCC)   . Essential hypertension, benign 03/28/2013  . Insulin-requiring or dependent type II diabetes mellitus (HCC) 03/15/2013    Past Surgical  History:  Procedure Laterality Date  . MULTIPLE TOOTH EXTRACTIONS          Home Medications    Prior to Admission medications   Medication Sig Start Date End Date Taking? Authorizing Provider  ARIPiprazole (ABILIFY) 10 MG tablet Take 1 tablet (10 mg total) by mouth daily. 12/10/18  Yes Pokhrel, Laxman, MD  carbamazepine (TEGRETOL) 200 MG tablet Take 0.5 tablets (100 mg total) by mouth 2 (two)  times daily. 11/26/18  Yes Mancel BaleWentz, Elliott, MD  carvedilol (COREG) 6.25 MG tablet Take 1 tablet (6.25 mg total) by mouth 2 (two) times daily with a meal. 11/26/18  Yes Mancel BaleWentz, Elliott, MD  furosemide (LASIX) 40 MG tablet Take 1 tablet (40 mg total) by mouth 2 (two) times daily for 30 days. 11/26/18 12/26/18 Yes Mancel BaleWentz, Elliott, MD  Ipratropium-Albuterol (COMBIVENT) 20-100 MCG/ACT AERS respimat Inhale 1 puff into the lungs every 6 (six) hours. 11/30/18  Yes Palumbo, April, MD  lisinopril (ZESTRIL) 10 MG tablet Take 1 tablet (10 mg total) by mouth daily. 11/26/18  Yes Mancel BaleWentz, Elliott, MD  metFORMIN (GLUCOPHAGE) 500 MG tablet Take 1 tablet (500 mg total) by mouth 2 (two) times daily with a meal. 12/10/18 12/10/19 Yes Pokhrel, Laxman, MD  potassium chloride SA (K-DUR) 20 MEQ tablet Take 1 tablet (20 mEq total) by mouth 2 (two) times daily. 11/30/18  Yes Palumbo, April, MD    Family History Family History  Problem Relation Age of Onset  . Hypertension Other   . Diabetes Other     Social History Social History   Tobacco Use  . Smoking status: Current Every Day Smoker    Packs/day: 1.00    Years: 20.00    Pack years: 20.00    Types: Cigarettes  . Smokeless tobacco: Current User  Substance Use Topics  . Alcohol use: Yes    Comment: Daily Drinker   . Drug use: Yes    Frequency: 7.0 times per week    Types: "Crack" cocaine, Cocaine, Marijuana    Comment: used cocaine 11-27-18     Allergies   Haldol [haloperidol]   Review of Systems Review of Systems  Respiratory: Positive for shortness of breath.   Cardiovascular: Positive for chest pain.  All other systems reviewed and are negative.    Physical Exam Updated Vital Signs BP (!) 186/123   Pulse 99   Resp (!) 21   SpO2 99%   Physical Exam Vitals signs and nursing note reviewed.  Constitutional:      Appearance: He is well-developed.     Comments: Disheveled, smells of urine and feces  HENT:     Head: Normocephalic and atraumatic.   Eyes:     Conjunctiva/sclera: Conjunctivae normal.     Pupils: Pupils are equal, round, and reactive to light.  Neck:     Musculoskeletal: Normal range of motion.  Cardiovascular:     Rate and Rhythm: Normal rate and regular rhythm.     Heart sounds: Normal heart sounds.  Pulmonary:     Effort: Pulmonary effort is normal.     Breath sounds: Wheezing present.     Comments: Breath sounds junky, NRB in place, O2 sats 100% Abdominal:     General: Bowel sounds are normal.     Palpations: Abdomen is soft.  Musculoskeletal: Normal range of motion.     Comments: Plus pitting edema of lower extremities, feces noted on lower legs and socks  Skin:    General: Skin is warm and dry.  Neurological:  Mental Status: He is alert and oriented to person, place, and time.      ED Treatments / Results  Labs (all labs ordered are listed, but only abnormal results are displayed) Labs Reviewed  CBC WITH DIFFERENTIAL/PLATELET - Abnormal; Notable for the following components:      Result Value   WBC 12.0 (*)    RBC 3.88 (*)    Hemoglobin 9.0 (*)    HCT 31.6 (*)    MCH 23.2 (*)    MCHC 28.5 (*)    RDW 21.4 (*)    Neutro Abs 10.5 (*)    All other components within normal limits  BASIC METABOLIC PANEL - Abnormal; Notable for the following components:   Glucose, Bld 272 (*)    BUN 29 (*)    All other components within normal limits  TROPONIN I - Abnormal; Notable for the following components:   Troponin I 0.03 (*)    All other components within normal limits  BRAIN NATRIURETIC PEPTIDE - Abnormal; Notable for the following components:   B Natriuretic Peptide 1,591.9 (*)    All other components within normal limits  SARS CORONAVIRUS 2    EKG EKG Interpretation  Date/Time:  Wednesday December 16 2018 04:22:26 EDT Ventricular Rate:  97 PR Interval:    QRS Duration: 57 QT Interval:  363 QTC Calculation: 462 R Axis:   97 Text Interpretation:  Sinus tachycardia Ventricular premature complex  Anterior infarct, old No significant change was found Confirmed by Glynn Octaveancour, Stephen [id number]) on 12/16/2018 5:35:14 AM   Radiology Dg Chest Port 1 View  Result Date: 12/16/2018 CLINICAL DATA:  Shortness of breath.  Hypoxia.  Abdominal swelling. EXAM: PORTABLE CHEST 1 VIEW COMPARISON:  One-view chest x-ray 12/13/2018 FINDINGS: Heart is enlarged. Lung volumes are low. Mild diffuse edema is present. Small effusions are present. Bibasilar airspace disease is noted, left greater than right. IMPRESSION: 1. Cardiomegaly with increasing interstitial edema and bilateral pleural effusions consistent with congestive heart failure. 2. Bibasilar airspace disease likely reflects atelectasis, left greater than right. Infection is not excluded. Electronically Signed   By: Marin Robertshristopher  Mattern M.D.   On: 12/16/2018 04:37    Procedures Procedures (including critical care time)  Medications Ordered in ED Medications  furosemide (LASIX) injection 40 mg (40 mg Intravenous Given 12/16/18 0414)  acetaminophen (TYLENOL) tablet 1,000 mg (1,000 mg Oral Given 12/16/18 0413)     Initial Impression / Assessment and Plan / ED Course  I have reviewed the triage vital signs and the nursing notes.  Pertinent labs & imaging results that were available during my care of the patient were reviewed by me and considered in my medical decision making (see chart for details).  58 year old male here with shortness of breath.  Recent admission for same.  He reports he was discharged to early, however it appears that he left AMA yesterday.  Upon EMS arrival he was hypoxic with oxygen saturations of 80%, improved with nonrebreather.  O2 sats are 100% on arrival to ED.  He does have some wheezes and appears fluid overloaded on exam with peripheral and abdominal edema.  Patient states he is willing to stay for treatment as he "feels terrible".  Screening labs pending.  Portable chest x-ray has been done, consistent with sitting interstitial  edema.  EKG unchanged from prior.  Given IV Lasix.  Labs as above, troponin equivocal at 0.03 which appears to be patient's baseline.  BNP is 1591.  Patient with some improvement after IV Lasix here.  He is willing to comply with admission.    Discussed with Dr. Hal Hope, will admit for ongoing care.  Final Clinical Impressions(s) / ED Diagnoses   Final diagnoses:  Acute on chronic combined systolic and diastolic CHF (congestive heart failure) [facility])  Hypoxia    ED Discharge Orders    None       Larene Pickett, PA-C 12/16/18 1027    Ezequiel Essex, MD 12/16/18 2107

## 2018-12-16 NOTE — ED Triage Notes (Addendum)
Pt arrived via [facility]; pt was at [facility] [employer] [facility] station; pt with c/o SOBx 1 day; pt with abd swelling; BLE; expiratory wheezing; Pt states that he is having abd pain; Pt 80% on RA; placed on NB and sating now at 100%; denies CP; pt incontinent of B/B; 169/106; 99; 22; 97.9

## 2018-12-16 NOTE — ED Notes (Signed)
ED TO INPATIENT HANDOFF REPORT  ED Nurse Name and Phone #: Maralyn SagoSarah RN   S Name/Age/Gender Mellody MemosFrederick L Rueb 58 y.o. male Room/Bed: 021C/021C  Code Status   Code Status: Prior  Home/SNF/Other Home Patient oriented to: self, place, time and situation Is this baseline? Yes   Triage Complete: Triage complete  Chief Complaint shob SI  Triage Note [facility]; [facility]; pt with c/o SOBx 1 day; pt with abd swelling; BLE; expiratory wheezing; Pt states that he is having abd pain; Pt 80% on RA; placed on NB and sating now at 100%; denies CP; pt incontinent of B/B; 169/106; 99; 22; 97.9   Allergies Allergies  Allergen Reactions  . Haldol [Haloperidol] Other (See Comments)    Muscle spasms, loss of voluntary movement. However, pt has taken Thorazine on multiple occasions with no adverse effects.     Level of Care/Admitting Diagnosis ED Disposition    ED Disposition Condition Comment   Admit  [facility] [[id number]]  Level of Care: Telemetry Cardiac [103]  Covid Evaluation: N/A  Diagnosis: Acute CHF (congestive heart failure) [facility](HCC) [[id number]][[id number]]  Admitting Physician: Eduard ClosKAKRAKANDY, ARSHAD N [phone][[id number]]  Attending Physician: Eduard ClosKAKRAKANDY, ARSHAD N [phone][[id number]]  Estimated length of stay: past midnight tomorrow  Certification:: I certify this patient will need inpatient services for at least 2 midnights  PT Class (Do Not Modify): Inpatient [101]  PT Acc Code (Do Not Modify): Private [1]       B Medical/Surgery History Past Medical History:  Diagnosis Date  . Chronic foot pain   . Cocaine abuse (HCC)   . Diabetes mellitus without complication (HCC)   . Hepatitis C    unsure   . Homelessness   . Hypertension   . Neuropathy   . Polysubstance abuse (HCC)   . Schizophrenia (HCC)   . Sleep apnea   . Systolic and diastolic CHF, chronic (HCC)    Past Surgical History:  Procedure Laterality Date  . MULTIPLE TOOTH EXTRACTIONS        A IV Location/Drains/Wounds Patient Lines/Drains/Airways Status   Active Line/Drains/Airways    Name:   Placement date:   Placement time:   Site:   Days:   Peripheral IV 12/16/18 Left Hand   12/16/18    0728    Hand   less than 1   External Urinary Catheter   12/10/18    0138    -   6   Pressure Injury 11/07/18 Stage II -  Partial thickness loss of dermis presenting as a shallow open ulcer with a red, pink wound bed without slough. small dime sized area   11/07/18    1532     39   Pressure Injury 11/07/18 Stage II -  Partial thickness loss of dermis presenting as a shallow open ulcer with a red, pink wound bed without slough. small dime sized area   11/07/18    1535     39          Intake/Output Last 24 hours No intake or output data in the 24 hours ending 12/16/18 0729  Labs/Imaging Results for orders placed or performed during the hospital encounter of 12/16/18 (from the past 48 hour(s))  CBC with Differential     Status: Abnormal   Collection Time: 12/16/18  4:15 AM  Result Value Ref Range   WBC 12.0 (H) 4.0 - 10.5 K/uL   RBC 3.88 (L) 4.22 - 5.81 MIL/uL  Hemoglobin 9.0 (L) 13.0 - 17.0 g/dL   HCT 16.131.6 (L) 09.639.0 - 04.552.0 %   MCV 81.4 80.0 - 100.0 fL   MCH 23.2 (L) 26.0 - 34.0 pg   MCHC 28.5 (L) 30.0 - 36.0 g/dL   RDW 40.921.4 (H) 81.111.5 - 91.415.5 %   Platelets 213 150 - 400 K/uL   nRBC 0.0 0.0 - 0.2 %   Neutrophils Relative % 88 %   Neutro Abs 10.5 (H) 1.7 - 7.7 K/uL   Lymphocytes Relative 6 %   Lymphs Abs 0.7 0.7 - 4.0 K/uL   Monocytes Relative 6 %   Monocytes Absolute 0.8 0.1 - 1.0 K/uL   Eosinophils Relative 0 %   Eosinophils Absolute 0.0 0.0 - 0.5 K/uL   Basophils Relative 0 %   Basophils Absolute 0.0 0.0 - 0.1 K/uL   Immature Granulocytes 0 %   Abs Immature Granulocytes 0.05 0.00 - 0.07 K/uL    Comment: Performed at [facility], [address] [place], [place] [address]  Basic metabolic panel     Status: Abnormal   Collection Time: 12/16/18  4:15 AM  Result Value  Ref Range   Sodium 138 135 - 145 mmol/L   Potassium 4.4 3.5 - 5.1 mmol/L   Chloride 100 98 - 111 mmol/L   CO2 27 22 - 32 mmol/L   Glucose, Bld 272 (H) 70 - 99 mg/dL   BUN 29 (H) 6 - 20 mg/dL   Creatinine, Ser 6.211.17 0.61 - 1.24 mg/dL   Calcium 8.9 8.9 - 30.810.3 mg/dL   GFR calc non Af Amer >60 >60 mL/min   GFR calc Af Amer >60 >60 mL/min   Anion gap 11 5 - 15    Comment: Performed at [facility], [address] [place], [place] [address]  Troponin I - ONCE - STAT     Status: Abnormal   Collection Time: 12/16/18  4:15 AM  Result Value Ref Range   Troponin I 0.03 (HH) <0.03 ng/mL    Comment: CRITICAL RESULT CALLED TO, READ BACK BY AND VERIFIED WITH: GRINDSTAFF,S RN 12/16/2018 0512 JORDANS Performed at [facility], [address] [place], [place] [address]   Brain natriuretic peptide     Status: Abnormal   Collection Time: 12/16/18  4:15 AM  Result Value Ref Range   B Natriuretic Peptide 1,591.9 (H) 0.0 - 100.0 pg/mL    Comment: Performed at [facility], [address] [place], [place] [address]  SARS Coronavirus 2     Status: None   Collection Time: 12/16/18  4:25 AM  Result Value Ref Range   SARS Coronavirus 2 NOT DETECTED NOT DETECTED    Comment: (NOTE) SARS-CoV-2 target nucleic acids are NOT DETECTED. The SARS-CoV-2 RNA is generally detectable in upper and lower respiratory specimens during the acute phase of infection.  Negative  results do not preclude SARS-CoV-2 infection, do not rule out co-infections with other pathogens, and should not be used as the sole basis for treatment or other patient management decisions.  Negative results must be combined with clinical observations, patient history, and epidemiological information. The expected result is Not Detected. Fact Sheet for Patients: http://www.biofiredefense.com/wp-content/uploads/2020/03/BIOFIRE-COVID -19-patients.pdf Fact Sheet for Healthcare  Providers: http://www.biofiredefense.com/wp-content/uploads/2020/03/BIOFIRE-COVID -19-hcp.pdf This test is not yet approved or cleared by the [place] FDA and  has been authorized for detection and/or diagnosis of SARS-CoV-2 by FDA under an Emergency Use Authorization (EUA).  This EUA will remain in effec t (  meaning this test can be used) for the duration of  the COVID-19 declaration under Section 564(b)(1) of the Act, 21 U.S.C. section 360bbb-3(b)(1), unless the authorization is terminated or revoked sooner. Performed at [facility], [place] [address], [place], [place] [address]    Dg Chest Port 1 View  Result Date: 12/16/2018 CLINICAL DATA:  Shortness of breath.  Hypoxia.  Abdominal swelling. EXAM: PORTABLE CHEST 1 VIEW COMPARISON:  One-view chest x-ray 12/13/2018 FINDINGS: Heart is enlarged. Lung volumes are low. Mild diffuse edema is present. Small effusions are present. Bibasilar airspace disease is noted, left greater than right. IMPRESSION: 1. Cardiomegaly with increasing interstitial edema and bilateral pleural effusions consistent with congestive heart failure. 2. Bibasilar airspace disease likely reflects atelectasis, left greater than right. Infection is not excluded. Electronically Signed   By: San Morelle M.D.   On: 12/16/2018 04:37    Pending Labs Unresulted Labs (From admission, onward)   None      Vitals/Pain Today's Vitals   12/16/18 0630 12/16/18 0645 12/16/18 0700 12/16/18 0715  BP: (!) 157/103 (!) 166/105 (!) 162/108 (!) 155/97  Pulse: 97 94 93 92  Resp: 19 18 20 19   SpO2: 100% 100% 100% 100%  PainSc:        Isolation Precautions No active isolations  Medications Medications  furosemide (LASIX) injection 40 mg (40 mg Intravenous Given 12/16/18 0414)  acetaminophen (TYLENOL) tablet 1,000 mg (1,000 mg Oral Given 12/16/18 0413)    Mobility walks High fall risk   Focused Assessments Pulmonary Assessment Handoff:  Lung sounds:  Bilateral Breath Sounds: Expiratory wheezes L Breath Sounds: Expiratory wheezes R Breath Sounds: Expiratory wheezes O2 Device: NRB        R Recommendations: See Admitting Provider Note  Report given to:   Additional Notes:

## 2018-12-17 LAB — GLUCOSE, CAPILLARY
Glucose-Capillary: 188 mg/dL — ABNORMAL HIGH (ref 70–99)
Glucose-Capillary: 182 mg/dL — ABNORMAL HIGH (ref 70–99)
Glucose-Capillary: 156 mg/dL — ABNORMAL HIGH (ref 70–99)
Glucose-Capillary: 185 mg/dL — ABNORMAL HIGH (ref 70–99)

## 2018-12-17 LAB — CBC WITH DIFFERENTIAL/PLATELET
Abs Immature Granulocytes: 0.02 10*3/uL (ref 0.00–0.07)
Basophils Absolute: 0.1 10*3/uL (ref 0.0–0.1)
Basophils Relative: 1 %
Eosinophils Absolute: 0.1 10*3/uL (ref 0.0–0.5)
Eosinophils Relative: 1 %
HCT: 29.3 % — ABNORMAL LOW (ref 39.0–52.0)
Hemoglobin: 8.4 g/dL — ABNORMAL LOW (ref 13.0–17.0)
Immature Granulocytes: 0 %
Lymphocytes Relative: 11 %
Lymphs Abs: 0.8 10*3/uL (ref 0.7–4.0)
MCH: 23.1 pg — ABNORMAL LOW (ref 26.0–34.0)
MCHC: 28.7 g/dL — ABNORMAL LOW (ref 30.0–36.0)
MCV: 80.7 fL (ref 80.0–100.0)
Monocytes Absolute: 0.7 10*3/uL (ref 0.1–1.0)
Monocytes Relative: 10 %
Neutro Abs: 5.9 10*3/uL (ref 1.7–7.7)
Neutrophils Relative %: 77 %
Platelets: 176 10*3/uL (ref 150–400)
RBC: 3.63 MIL/uL — ABNORMAL LOW (ref 4.22–5.81)
RDW: 21.1 % — ABNORMAL HIGH (ref 11.5–15.5)
WBC: 7.5 10*3/uL (ref 4.0–10.5)
nRBC: 0.3 % — ABNORMAL HIGH (ref 0.0–0.2)

## 2018-12-17 LAB — BASIC METABOLIC PANEL
Anion gap: 6 (ref 5–15)
BUN: 33 mg/dL — ABNORMAL HIGH (ref 6–20)
CO2: 31 mmol/L (ref 22–32)
Calcium: 8.7 mg/dL — ABNORMAL LOW (ref 8.9–10.3)
Chloride: 101 mmol/L (ref 98–111)
Creatinine, Ser: 1.11 mg/dL (ref 0.61–1.24)
GFR calc Af Amer: >60 mL/min (ref >60)
GFR calc non Af Amer: >60 mL/min (ref >60)
Glucose, Bld: 255 mg/dL — ABNORMAL HIGH (ref 70–99)
Potassium: 4.3 mmol/L (ref 3.5–5.1)
Sodium: 138 mmol/L (ref 135–145)

## 2018-12-17 MED ORDER — GLIPIZIDE 2.5 MG HALF TABLET
2.5000 mg | ORAL_TABLET | Freq: Two times a day (BID) | ORAL | Status: DC
  Administered 2018-12-18: 06:00:00 2.5 mg via ORAL
  Filled 2018-12-17 (×2): qty 1

## 2018-12-17 NOTE — Progress Notes (Signed)
PROGRESS NOTE    Mellody MemosFrederick L Overbay  WUJ:[id number] DOB: 07-Mar-1961 DOA: 12/16/2018 PCP: Patient, No Pcp Per   Brief Narrative:  58 y.o. 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 Patient has had several ER visits, and in this month particularly has been hospitalized 3 times with congestive heart failure symptoms and had most recently left AGAINST MEDICAL ADVICE yesterday.  He complains of having shortness of breath with any kind of activity.  When he gets short of breath he gets anxious and reports having a panic attack.  Patient admits to daily cocaine use whenever he is out as he is homeless.  States that they have been trying to set him up with a place to live.  Once again he 95 pounds over last 6 months along with leg tightness.  Patient also complains of scrotal swelling for which he notes that he was previously on antibiotics, but he lost them after being discharged at some point in time.  Other associated symptoms include cough, wheezing, and abdominal distention.  ED Course: Upon admission into the emergency department patient was seen to have blood pressure 155/97-162/108, O2 saturations maintained on a nonrebreather, and all other vital signs maintained.  Labs revealed WBC 12, hemoglobin 9, BUN 29, creatinine 1.17, glucose 272, BNP 1591.9, and troponin 0.03.  COVID-19 screening negative.  Chest x-ray showing cardiomegaly with interstitial edema and bilateral pleural effusions.  Patient was given 40 mg of Lasix IV.  TRH called to admit.  Accepted as inpatient to a telemetry bed.  12/17/2018-patient seen and examined in the room he just went AMA as we were looking for a place for him to be discharged he comes back in with similar complaints abdominal distention scrotal swelling and shortness of breath. Assessment & Plan:   Principal  Problem:   Acute on chronic systolic (congestive) heart failure (HCC) Active Problems:   Insulin-requiring or dependent type II diabetes mellitus (HCC)   Essential hypertension, benign   Cocaine use disorder, severe, dependence (HCC)   Homelessness   Tobacco use   Acute respiratory failure with hypoxia (HCC)   Anemia  Acute respiratory failure secondary to combined systolic and diastolic congestive heart failure exacerbation: Acute on chronic.  Patient presents with shortness of breath.  Physical exam reveals 2+ pitting lower extremity edema and JVD chest x-ray showing cardiomegaly with pulmonary edema and bilateral pleural effusions.  BNP elevated at 1591.9 which is higher than previous values over the last 2 months.  Last EF of 25 to 30% echocardiogram on 11/07/2018.  Patient was given 40 mg of Lasix IV in the emergency department.  Complex situation with issues include mental health, polysubstance abuse, understanding, and noncompliance.  Continue Lasix 40 mg IV twice daily.  Consult Child psychotherapistsocial worker for placement and homeless issues.   Elevated troponin: Troponin mildly elevated at 0.03 on admission which appears similar to previous values during multiple admissions to this month.  Leukocytosis resolving on keflex.  Chest x-ray without clear note of infiltrate.  COVID-19 screening negative.  Suspect symptoms could be coming from scrotal cellulitis.  Scrotal edema/cellulitis: Question possibility of underlying infection.  Patient reports previously being on antibiotics. -Start Keflex  Hypochromic anemia: Chronic.  Hemoglobin 9 g/dL on admission which appears near patient's baseline. -Continue to monitor  Essential hypertension, uncontrolled: Blood pressures elevated up to 201/104 on admission. -Hold Coreg with possible  recent use of cocaine -Continue lisinopril -Hydralazine IV as needed  Diabetes mellitus type 2, uncontrolled: Last hemoglobin A1c 9.7 on 12/04/2018.  Only reported to be on  medications of metformin. -Hypoglycemic protocol -Hold metformin start glipizide  -CBGs q. before meals and at bedtime with moderate SSI  Schizophrenia -Continue Abilify and Tegretol  Polysubstance abuse: Patient admits to regular cocaine and tobacco use. -Check urine drug screen  -Counseled on need of cessation of tobacco and illicit drug use  Obesity: BMI 37.21 kg/m  Homelessness: Patient with multiple admissions -Social work consult  DVT prophylaxis: Lovenox Code Status: Full Family Communication: No family present at bedside Disposition Plan:pending improvement Consults called: None   Pressure Injury 11/07/18 Stage II -  Partial thickness loss of dermis presenting as a shallow open ulcer with a red, pink wound bed without slough. small dime sized area (Active)  11/07/18 1532  Location: Sacrum  Location Orientation: Mid  Staging: Stage II -  Partial thickness loss of dermis presenting as a shallow open ulcer with a red, pink wound bed without slough.  Wound Description (Comments): small dime sized area  Present on Admission: Yes     Pressure Injury 11/07/18 Stage II -  Partial thickness loss of dermis presenting as a shallow open ulcer with a red, pink wound bed without slough. small dime sized area (Active)  11/07/18 1535  Location: Hip  Location Orientation: Right  Staging: Stage II -  Partial thickness loss of dermis presenting as a shallow open ulcer with a red, pink wound bed without slough.  Wound Description (Comments): small dime sized area  Present on Admission: Yes     Estimated body mass index is 37.18 kg/m as calculated from the following:   Height as of this encounter: 5\' 9"  (1.753 m).   Weight as of this encounter: 114.2 kg.   Subjective: C/o sob swearing he wouldn't go ama  Objective: Vitals:   12/17/18 0049 12/17/18 0332 12/17/18 0805 12/17/18 1208  BP: 112/72 113/76 (!) 145/85 (!) 144/94  Pulse: 95 91 85 93  Resp:  (!) 22 18   Temp:  98 F (36.7 C) (!) 97 F (36.1 C)  97.8 F (36.6 C)  TempSrc: Oral   Oral  SpO2: 93% (!) 86% 100% 93%  Weight:  114.2 kg    Height:        Intake/Output Summary (Last 24 hours) at 12/17/2018 1421 Last data filed at 12/17/2018 1150 Gross per 24 hour  Intake 663 ml  Output 1100 ml  Net -437 ml   Filed Weights   12/16/18 0815 12/17/18 0332  Weight: 113.7 kg 114.2 kg    Examination:  General exam: Appears calm and comfortable  Respiratory system: Bibasilar Rales to auscultation. Respiratory effort normal. Cardiovascular system: S1 & S2 heard, RRR. No JVD, murmurs, rubs, gallops or clicks. No pedal edema. Gastrointestinal system: Abdomen is nondistended, soft and nontender. No organomegaly or masses felt. Normal bowel sounds heard. Central nervous system: Alert and oriented. No focal neurological deficits. Extremities: 3+ edema, scrotal edema Skin: No rashes, lesions or ulcers    Data Reviewed: I have personally reviewed following labs and imaging studies  CBC: Recent Labs  Lab 12/10/18 2205 12/16/18 0415 12/17/18 0404  WBC 9.6 12.0* 7.5  NEUTROABS  --  10.5* 5.9  HGB 9.4* 9.0* 8.4*  HCT 32.1* 31.6* 29.3*  MCV 79.5* 81.4 80.7  PLT 241 213 176   Basic Metabolic Panel: Recent Labs  Lab 12/13/18 0635 12/14/18 0344 12/15/18  [id number] 12/16/18 0415 12/17/18 0404  NA 138 139 138 138 138  K 4.2 4.3 4.1 4.4 4.3  CL 98 101 99 100 101  CO2 31 32 31 27 31   GLUCOSE 120* 146* 193* 272* 255*  BUN 19 22* 21* 29* 33*  CREATININE 0.96 1.07 0.91 1.17 1.11  CALCIUM 8.9 9.0 8.9 8.9 8.7*   GFR: Estimated Creatinine Clearance: 91.5 mL/min (by C-G formula based on SCr of 1.11 mg/dL). Liver Function Tests: No results for input(s): AST, ALT, ALKPHOS, BILITOT, PROT, ALBUMIN in the last 168 hours. Recent Labs  Lab 12/11/18 0407  LIPASE 25   No results for input(s): AMMONIA in the last 168 hours. Coagulation Profile: No results for input(s): INR, PROTIME in the last 168 hours.  Cardiac Enzymes: Recent Labs  Lab 12/16/18 0415 12/16/18 0831 12/16/18 1645 12/16/18 2118  TROPONINI 0.03* 0.04* 0.03* 0.03*   BNP (last 3 results) No results for input(s): PROBNP in the last 8760 hours. HbA1C: No results for input(s): HGBA1C in the last 72 hours. CBG: Recent Labs  Lab 12/16/18 1146 12/16/18 1644 12/16/18 2132 12/17/18 0606 12/17/18 1148  GLUCAP 221* 169* 182* 188* 182*   Lipid Profile: No results for input(s): CHOL, HDL, LDLCALC, TRIG, CHOLHDL, LDLDIRECT in the last 72 hours. Thyroid Function Tests: No results for input(s): TSH, T4TOTAL, FREET4, T3FREE, THYROIDAB in the last 72 hours. Anemia Panel: No results for input(s): VITAMINB12, FOLATE, FERRITIN, TIBC, IRON, RETICCTPCT in the last 72 hours. Sepsis Labs: No results for input(s): PROCALCITON, LATICACIDVEN in the last 168 hours.  Recent Results (from the past 240 hour(s))  SARS Coronavirus 2     Status: None   Collection Time: 12/11/18 12:52 AM  Result Value Ref Range Status   SARS Coronavirus 2 NOT DETECTED NOT DETECTED Final    Comment: (NOTE) SARS-CoV-2 target nucleic acids are NOT DETECTED. The SARS-CoV-2 RNA is generally detectable in upper and lower respiratory specimens during the acute phase of infection.  Negative  results do not preclude SARS-CoV-2 infection, do not rule out co-infections with other pathogens, and should not be used as the sole basis for treatment or other patient management decisions.  Negative results must be combined with clinical observations, patient history, and epidemiological information. The expected result is Not Detected. Fact Sheet for Patients: http://www.biofiredefense.com/wp-content/uploads/2020/03/BIOFIRE-COVID -19-patients.pdf Fact Sheet for Healthcare Providers: http://www.biofiredefense.com/wp-content/uploads/2020/03/BIOFIRE-COVID -19-hcp.pdf This test is not yet approved or cleared by the [place] FDA and  has been authorized for detection  and/or diagnosis of SARS-CoV-2 by FDA under an Emergency Use Authorization (EUA).  This EUA will remain in effec t (meaning this test can be used) for the duration of  the COVID-19 declaration under Section 564(b)(1) of the Act, 21 U.S.C. section 360bbb-3(b)(1), unless the authorization is terminated or revoked sooner. Performed at [facility], [address] [place], [place] [address]   SARS Coronavirus 2     Status: None   Collection Time: 12/16/18  4:25 AM  Result Value Ref Range Status   SARS Coronavirus 2 NOT DETECTED NOT DETECTED Final    Comment: (NOTE) SARS-CoV-2 target nucleic acids are NOT DETECTED. The SARS-CoV-2 RNA is generally detectable in upper and lower respiratory specimens during the acute phase of infection.  Negative  results do not preclude SARS-CoV-2 infection, do not rule out co-infections with other pathogens, and should not be used as the sole basis for treatment or other patient management decisions.  Negative results must be combined with clinical observations, patient history, and epidemiological  information. The expected result is Not Detected. Fact Sheet for Patients: http://www.biofiredefense.com/wp-content/uploads/2020/03/BIOFIRE-COVID -19-patients.pdf Fact Sheet for Healthcare Providers: http://www.biofiredefense.com/wp-content/uploads/2020/03/BIOFIRE-COVID -19-hcp.pdf This test is not yet approved or cleared by the [place] and  has been authorized for detection and/or diagnosis of SARS-CoV-2 by FDA under an Emergency Use Authorization (EUA).  This EUA will remain in effec t (meaning this test can be used) for the duration of  the COVID-19 declaration under Section 564(b)(1) of the Act, 21 U.S.C. section 360bbb-3(b)(1), unless the authorization is terminated or revoked sooner. Performed at [facility], [place] [address] [place], [place] [address]          Radiology Studies: Dg Chest Port 1 View  Result Date:  12/16/2018 CLINICAL DATA:  Shortness of breath.  Hypoxia.  Abdominal swelling. EXAM: PORTABLE CHEST 1 VIEW COMPARISON:  One-view chest x-ray 12/13/2018 FINDINGS: Heart is enlarged. Lung volumes are low. Mild diffuse edema is present. Small effusions are present. Bibasilar airspace disease is noted, left greater than right. IMPRESSION: 1. Cardiomegaly with increasing interstitial edema and bilateral pleural effusions consistent with congestive heart failure. 2. Bibasilar airspace disease likely reflects atelectasis, left greater than right. Infection is not excluded. Electronically Signed   By: San Morelle M.D.   On: 12/16/2018 04:37        Scheduled Meds: . ARIPiprazole  10 mg Oral Daily  . carbamazepine  100 mg Oral BID  . cephALEXin  500 mg Oral Q8H  . enoxaparin (LOVENOX) injection  40 mg Subcutaneous Q24H  . furosemide  40 mg Intravenous BID  . insulin aspart  0-15 Units Subcutaneous TID WC  . insulin aspart  0-5 Units Subcutaneous QHS  . lisinopril  10 mg Oral Daily  . mouth rinse  15 mL Mouth Rinse BID  . potassium chloride SA  20 mEq Oral BID  . sodium chloride flush  3 mL Intravenous Q12H   Continuous Infusions: . sodium chloride       LOS: 1 day     Georgette Shell, MD [facility]  If 7PM-7AM, please contact night-coverage [email or web address] Password [email or web address] 12/17/2018, 2:21 PM

## 2018-12-17 NOTE — Plan of Care (Signed)
  Problem: Clinical Measurements: Goal: Ability to maintain clinical measurements within normal limits will improve Outcome: Progressing Goal: Will remain free from infection Outcome: Progressing   

## 2018-12-17 NOTE — Progress Notes (Signed)
Pt calls out into hall requesting "heat to my balls". Making multiple requests to various staff members including dietary for "double the food" and drinks. Was educated multiple times about relationship between excessive fluids and CHF as well as obesity's effects on heart function. Pt states no evidence of learning and continues to make these frequent request. Did place heat packs to scrotum several times this shift. Educated pt on how to apply them himself. Pt refused assistance for heat to scrotum from male staff but continues to request this writer to place them for him. Did encourage pt to attempt self care and re-educated on proper application of heat to area.

## 2018-12-18 ENCOUNTER — Telehealth: Payer: Self-pay | Admitting: General Practice

## 2018-12-18 ENCOUNTER — Other Ambulatory Visit: Payer: Self-pay

## 2018-12-18 ENCOUNTER — Encounter (HOSPITAL_COMMUNITY): Payer: Self-pay | Admitting: Emergency Medicine

## 2018-12-18 ENCOUNTER — Inpatient Hospital Stay (HOSPITAL_COMMUNITY)
Admission: EM | Admit: 2018-12-18 | Discharge: 2018-12-21 | DRG: 292 | Discharge status: Against Medical Advice | Payer: Medicare Other | Attending: Family Medicine | Admitting: Family Medicine

## 2018-12-18 DIAGNOSIS — R0902 Hypoxemia: Secondary | ICD-10-CM | POA: Diagnosis not present

## 2018-12-18 DIAGNOSIS — Z8249 Family history of ischemic heart disease and other diseases of the circulatory system: Secondary | ICD-10-CM

## 2018-12-18 DIAGNOSIS — Z833 Family history of diabetes mellitus: Secondary | ICD-10-CM

## 2018-12-18 DIAGNOSIS — Z9114 Patient's other noncompliance with medication regimen: Secondary | ICD-10-CM

## 2018-12-18 DIAGNOSIS — Z79899 Other long term (current) drug therapy: Secondary | ICD-10-CM

## 2018-12-18 DIAGNOSIS — I110 Hypertensive heart disease with heart failure: Secondary | ICD-10-CM | POA: Diagnosis not present

## 2018-12-18 DIAGNOSIS — Z5900 Homelessness unspecified: Secondary | ICD-10-CM

## 2018-12-18 DIAGNOSIS — E785 Hyperlipidemia, unspecified: Secondary | ICD-10-CM | POA: Diagnosis present

## 2018-12-18 DIAGNOSIS — I10 Essential (primary) hypertension: Secondary | ICD-10-CM | POA: Diagnosis present

## 2018-12-18 DIAGNOSIS — Z794 Long term (current) use of insulin: Secondary | ICD-10-CM

## 2018-12-18 DIAGNOSIS — G4733 Obstructive sleep apnea (adult) (pediatric): Secondary | ICD-10-CM | POA: Diagnosis present

## 2018-12-18 DIAGNOSIS — M79673 Pain in unspecified foot: Secondary | ICD-10-CM | POA: Diagnosis present

## 2018-12-18 DIAGNOSIS — R7989 Other specified abnormal findings of blood chemistry: Secondary | ICD-10-CM | POA: Diagnosis present

## 2018-12-18 DIAGNOSIS — I5084 End stage heart failure: Secondary | ICD-10-CM | POA: Diagnosis present

## 2018-12-18 DIAGNOSIS — F200 Paranoid schizophrenia: Secondary | ICD-10-CM | POA: Diagnosis present

## 2018-12-18 DIAGNOSIS — E119 Type 2 diabetes mellitus without complications: Secondary | ICD-10-CM

## 2018-12-18 DIAGNOSIS — R262 Difficulty in walking, not elsewhere classified: Secondary | ICD-10-CM | POA: Diagnosis present

## 2018-12-18 DIAGNOSIS — Z950 Presence of cardiac pacemaker: Secondary | ICD-10-CM

## 2018-12-18 DIAGNOSIS — R001 Bradycardia, unspecified: Secondary | ICD-10-CM | POA: Diagnosis present

## 2018-12-18 DIAGNOSIS — Z1159 Encounter for screening for other viral diseases: Secondary | ICD-10-CM

## 2018-12-18 DIAGNOSIS — I442 Atrioventricular block, complete: Secondary | ICD-10-CM | POA: Diagnosis present

## 2018-12-18 DIAGNOSIS — F1420 Cocaine dependence, uncomplicated: Secondary | ICD-10-CM | POA: Diagnosis present

## 2018-12-18 DIAGNOSIS — B1920 Unspecified viral hepatitis C without hepatic coma: Secondary | ICD-10-CM | POA: Diagnosis present

## 2018-12-18 DIAGNOSIS — F1210 Cannabis abuse, uncomplicated: Secondary | ICD-10-CM | POA: Diagnosis present

## 2018-12-18 DIAGNOSIS — G8929 Other chronic pain: Secondary | ICD-10-CM | POA: Diagnosis present

## 2018-12-18 DIAGNOSIS — I5043 Acute on chronic combined systolic (congestive) and diastolic (congestive) heart failure: Secondary | ICD-10-CM | POA: Diagnosis present

## 2018-12-18 DIAGNOSIS — Z87891 Personal history of nicotine dependence: Secondary | ICD-10-CM

## 2018-12-18 DIAGNOSIS — I509 Heart failure, unspecified: Secondary | ICD-10-CM

## 2018-12-18 DIAGNOSIS — R778 Other specified abnormalities of plasma proteins: Secondary | ICD-10-CM | POA: Diagnosis present

## 2018-12-18 DIAGNOSIS — D649 Anemia, unspecified: Secondary | ICD-10-CM | POA: Diagnosis present

## 2018-12-18 LAB — BASIC METABOLIC PANEL
Anion gap: 9 (ref 5–15)
BUN: 25 mg/dL — ABNORMAL HIGH (ref 6–20)
CO2: 30 mmol/L (ref 22–32)
Calcium: 8.7 mg/dL — ABNORMAL LOW (ref 8.9–10.3)
Chloride: 100 mmol/L (ref 98–111)
Creatinine, Ser: 0.9 mg/dL (ref 0.61–1.24)
GFR calc Af Amer: >60 mL/min (ref >60)
GFR calc non Af Amer: >60 mL/min (ref >60)
Glucose, Bld: 231 mg/dL — ABNORMAL HIGH (ref 70–99)
Potassium: 4.3 mmol/L (ref 3.5–5.1)
Sodium: 139 mmol/L (ref 135–145)

## 2018-12-18 LAB — GLUCOSE, CAPILLARY
Glucose-Capillary: 218 mg/dL — ABNORMAL HIGH (ref 70–99)
Glucose-Capillary: 241 mg/dL — ABNORMAL HIGH (ref 70–99)

## 2018-12-18 MED ORDER — ACETAMINOPHEN 325 MG PO TABS
650.0000 mg | ORAL_TABLET | Freq: Once | ORAL | Status: AC
  Administered 2018-12-19: 650 mg via ORAL
  Filled 2018-12-18: qty 2

## 2018-12-18 MED ORDER — CEPHALEXIN 500 MG PO CAPS: 500.0000 mg | ORAL_CAPSULE | Freq: Three times a day (TID) | ORAL | 0 refills | Status: DC

## 2018-12-18 MED ORDER — FUROSEMIDE 10 MG/ML IJ SOLN
40.0000 mg | Freq: Once | INTRAMUSCULAR | Status: AC
  Administered 2018-12-19: 40 mg via INTRAVENOUS
  Filled 2018-12-18: qty 4

## 2018-12-18 MED ORDER — POTASSIUM CHLORIDE CRYS ER 20 MEQ PO TBCR
40.0000 meq | EXTENDED_RELEASE_TABLET | Freq: Once | ORAL | Status: AC
  Administered 2018-12-19: 40 meq via ORAL
  Filled 2018-12-18: qty 2

## 2018-12-18 MED ORDER — GLIPIZIDE 5 MG PO TABS: 2.5000 mg | ORAL_TABLET | Freq: Two times a day (BID) | ORAL | 1 refills | Status: DC

## 2018-12-18 NOTE — Care Management (Signed)
1135 12-18-18 CM did provide patient with bus pass. No further needs from CM at this time. Bethena Roys, RN,BSN Case Manager [phone]

## 2018-12-18 NOTE — TOC Transition Note (Signed)
Transition of Care [facility]) - CM/SW Discharge Note   Patient Details  Name: [name] MRN: [id number] Date of Birth: 14-Aug-1960  Transition of Care [facility]) CM/SW Contact:  Bethena Roys, RN Phone Number: 12/18/2018, 10:44 AM   Clinical Narrative: Pt presented for Acute on Chronic Systolic Heart Failure after leaving AMA on 6 E. Patient states his plan is to go to his cousins home at transition.  Pt states he needs a taxi voucher for transport. CM did offer bus pass for transportation to [facility] where he wants to wait on family. Patient can pick up medications at his pharmacy. No further needs from CM at this time.       Final next level of care: Home/Self Care(Plan to return to cousins home.) Barriers to Discharge: No Barriers Identified   Patient Goals and CMS Choice     Choice offered to / list presented to : NA  Discharge Placement                       Discharge Plan and Services In-house Referral: NA Discharge Planning Services: CM Consult Post Acute Care Choice: Durable Medical Equipment          DME Arranged: Walker rolling DME Agency: [employer] Date DME Agency Contacted: 12/18/18 Time DME Agency Contacted: 24 Representative spoke with at DME Agency: [place] Arranged: NA          Social Determinants of Health ([place]) Interventions     Readmission Risk Interventions Readmission Risk Prevention Plan 12/18/2018 10/05/2018  Transportation Screening Complete Complete  Medication Review Press photographer) Complete Complete  PCP or Specialist appointment within 3-5 days of discharge Complete Complete  HRI or Home Care Consult Complete Patient refused  SW Recovery Care/Counseling Consult Complete Complete  Palliative Care Screening Not Applicable Not [place] Not Applicable Not Applicable  Some recent data might be hidden
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
## 2018-12-18 NOTE — Care Management Important Message (Signed)
Important Message  Patient Details  Name: KAYDENN MCLEAR MRN: [id number] Date of Birth: [birth year]   Medicare Important Message Given:  Yes     Shelda Altes 12/18/2018, 11:57 AM

## 2018-12-18 NOTE — Telephone Encounter (Signed)
LVM for her to call back about the pt
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
## 2018-12-18 NOTE — ED Provider Notes (Signed)
[facility] EMERGENCY DEPARTMENT Provider Note   CSN: [id number] Arrival date & time: 12/18/18  2302    History   Chief Complaint Chief Complaint  Patient presents with  . Groin Swelling  . Abdominal Pain    HPI [name] is a 58 y.o. 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  Reports swelling in his legs, abdomen and scrotum have gotten worse and he can barely despite having a roller walker that he got from the hospital.  He has pain in his legs, abdomen, scrotum from swelling. States he was discharged from the hospital today at noon.  He does not take any medicines at home because he is homeless.  He reports painful penile wound for several days and is requesting antibiotics for this.  States he needs some fluid taken off of his body so we can start walking again.  Has noticed associated increased shortness of breath with walking and laying flat, productive cough, lightheadedness.  States he felt dizzy in the store and fell to the ground but did not lose consciousness, he was not able to get back up on his own because of all the swelling in his body.  Per EMS SPO2 at 75% on room air, placed on 4 L and normalized.  Pt left AMA from hospital per discharge summary but pt denies this, states he was discharged. He wants to be placed in a mental hospital for his schizophrenia.   Patient denies fever, congestion, rhinorrhea, sore throat, nausea, vomiting, diarrhea, dysuria.     HPI  Past Medical History:  Diagnosis Date  . Chronic foot pain   . Cocaine abuse ([place])   . Diabetes mellitus without complication ([place])   . Hepatitis C    unsure   . Homelessness   . Hypertension   . Neuropathy   . Polysubstance abuse ([place])   . Schizophrenia  ([place])   . Sleep apnea   . Systolic and diastolic CHF, chronic [facility])     Patient Active Problem List   Diagnosis Date Noted  . Acute CHF (congestive heart failure) ([place]) 12/16/2018  . Diabetes mellitus without complication ([place]) 87/56/4332  . Acute on chronic systolic (congestive) heart failure ([place]) 12/11/2018  . Abdominal pain 12/11/2018  . Nausea vomiting and diarrhea 12/11/2018  . Anemia 12/11/2018  . Evaluation by psychiatric service required   . MDD (major depressive disorder), severe ([place]) 11/25/2018  . Pressure injury of skin 11/08/2018  . Elevated troponin 10/18/2018  . HLD (hyperlipidemia) 10/18/2018  . Anxiety 10/18/2018  . CHF (congestive heart failure), NYHA class II, acute on chronic, combined ([place]) 10/18/2018  . Hypertensive urgency 10/12/2018  . Mild renal insufficiency 10/12/2018  . Cellulitis 10/12/2018  . Penile cellulitis   . Adjustment disorder with mixed disturbance of emotions and conduct   . Sleep apnea 10/03/2018  . Hypokalemia 09/17/2018  . Scrotal swelling 09/17/2018  . Urinary hesitancy   . Constipation   . Acute on chronic combined systolic and diastolic CHF (congestive heart failure) ([place]) 08/25/2018  . Acute respiratory failure with hypoxia ([place]) 08/25/2018  . Tobacco use 08/18/2018  . Homelessness 08/08/2018  . Smoker 08/08/2018  . Prostate enlargement 03/16/2018  . Aortic atherosclerosis ([place]) 03/16/2018  . 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 Chronic foot pain   .  Schizoaffective disorder, bipolar type (HCC) 09/30/2016  . Substance induced mood disorder (HCC) 03/13/2015  . Schizophrenia, paranoid type (HCC) 01/17/2015  . Drug hallucinosis (HCC) 10/08/2014  . Chronic paranoid schizophrenia (HCC) 09/07/2014  . 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 Urinary retention   . Cocaine use disorder, severe, dependence (HCC)   . Essential hypertension, benign  03/28/2013  . 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 MULTIPLE TOOTH EXTRACTIONS          Home Medications    Prior to Admission medications   Medication Sig Start Date End Date Taking? Authorizing Provider  ARIPiprazole (ABILIFY) 10 MG tablet Take 1 tablet (10 mg total) by mouth daily. 12/10/18  Yes Pokhrel, Laxman, MD  carbamazepine (TEGRETOL) 200 MG tablet Take 0.5 tablets (100 mg total) by mouth 2 (two) times daily. 11/26/18  Yes Mancel BaleWentz, Elliott, MD  furosemide (LASIX) 40 MG tablet Take 1 tablet (40 mg total) by mouth 2 (two) times daily for 30 days. 11/26/18 12/26/18 Yes Mancel BaleWentz, Elliott, MD  Ipratropium-Albuterol (COMBIVENT) 20-100 MCG/ACT AERS respimat Inhale 1 puff into the lungs every 6 (six) hours. 11/30/18  Yes Palumbo, April, MD  lisinopril (ZESTRIL) 10 MG tablet Take 1 tablet (10 mg total) by mouth daily. 11/26/18  Yes Mancel BaleWentz, Elliott, MD  potassium chloride SA (K-DUR) 20 MEQ tablet Take 1 tablet (20 mEq total) by mouth 2 (two) times daily. 11/30/18  Yes Palumbo, April, MD  cephALEXin (KEFLEX) 500 MG capsule Take 1 capsule (500 mg total) by mouth 3 (three) times daily. Patient not taking: Reported on 12/19/2018 12/18/18   Alwyn RenMathews, Elizabeth G, MD  glipiZIDE (GLUCOTROL) 5 MG tablet Take 0.5 tablets (2.5 mg total) by mouth 2 (two) times daily before a meal. Patient not taking: Reported on 12/19/2018 12/18/18   Alwyn RenMathews, Elizabeth G, MD    Family History Family History  Problem Relation Age of Onset  . Hypertension Other   . Diabetes Other     Social History Social History   Tobacco Use  . Smoking status: Current Every Day Smoker    Packs/day: 1.00    Years: 20.00    Pack years: 20.00    Types: Cigarettes  . Smokeless tobacco: Current User  Substance Use Topics  . Alcohol use: Yes    Comment: Daily Drinker   . Drug use: Yes    Frequency: 7.0 times per week    Types: "Crack" cocaine,  Cocaine, Marijuana    Comment: used cocaine 11-27-18     Allergies   Haldol [haloperidol]   Review of Systems Review of Systems  Respiratory: Positive for cough and shortness of breath.   Cardiovascular: Positive for leg swelling.  Gastrointestinal: Positive for abdominal distention.  Genitourinary: Positive for penile pain and scrotal swelling.  All other systems reviewed and are negative.    Physical Exam Updated Vital Signs BP (!) 174/95   Pulse 99   Temp 98.3 F (36.8 C) (Oral)   Resp (!) 21   Ht 5\' 9"  (1.753 m)   Wt 115.7 kg   SpO2 98%   BMI 37.66 kg/m   Physical Exam Vitals signs and nursing note reviewed.  Constitutional:      Appearance: He is well-developed.     Comments: Non toxic.  HENT:     Head: Normocephalic and atraumatic.     Nose: Nose normal.  Eyes:  Conjunctiva/sclera: Conjunctivae normal.  Neck:     Musculoskeletal: Normal range of motion.  Cardiovascular:     Rate and Rhythm: Normal rate and regular rhythm.     Pulses:          Radial pulses are 1+ on the right side and 1+ on the left side.       Dorsalis pedis pulses are 1+ on the right side and 1+ on the left side.     Heart sounds: Normal heart sounds.     Comments: 2+ pitting edema to bilateral leg up to thighs, skin is tense. No calf tenderness. No asymmetric leg edema. Thickened skin to lower legs. Pulmonary:     Effort: Pulmonary effort is normal.     Breath sounds: Normal breath sounds.  Abdominal:     General: Abdomen is protuberant. Bowel sounds are normal. There is distension.     Palpations: Abdomen is soft.     Tenderness: There is no abdominal tenderness.     Comments: Abdominal distention. Pitting edema to lower abdomen and lower back. No G/R/R. No suprapubic or CVA tenderness. Negative Murphy's and McBurney's. Easily reducible non tender umbilical hernia  Genitourinary:    Penis: Tenderness present.      Comments: approx 1 cm circular ulcerated tender lesion to left  shaft, no surrounding erythema, tenderness. Moderate scrotal edema, non tender without erythema Musculoskeletal: Normal range of motion.     Right lower leg: Edema present.     Left lower leg: Edema present.  Skin:    General: Skin is warm and dry.     Capillary Refill: Capillary refill takes less than 2 seconds.  Neurological:     Mental Status: He is alert.  Psychiatric:        Behavior: Behavior normal.      ED Treatments / Results  Labs (all labs ordered are listed, but only abnormal results are displayed) Labs Reviewed  BASIC METABOLIC PANEL - Abnormal; Notable for the following components:      Result Value   Glucose, Bld 258 (*)    BUN 22 (*)    Calcium 8.7 (*)    All other components within normal limits  BRAIN NATRIURETIC PEPTIDE - Abnormal; Notable for the following components:   B Natriuretic Peptide 974.0 (*)    All other components within normal limits  CBC - Abnormal; Notable for the following components:   RBC 3.74 (*)    Hemoglobin 8.7 (*)    HCT 29.6 (*)    MCV 79.1 (*)    MCH 23.3 (*)    MCHC 29.4 (*)    RDW 21.2 (*)    All other components within normal limits  RAPID URINE DRUG SCREEN, HOSP PERFORMED - Abnormal; Notable for the following components:   Cocaine POSITIVE (*)    Tetrahydrocannabinol POSITIVE (*)    All other components within normal limits  URINALYSIS, ROUTINE W REFLEX MICROSCOPIC - Abnormal; Notable for the following components:   Protein, ur 100 (*)    All other components within normal limits  TROPONIN I - Abnormal; Notable for the following components:   Troponin I 0.03 (*)    All other components within normal limits  SARS CORONAVIRUS 2 (HOSPITAL ORDER, PERFORMED IN [facility])  MAGNESIUM    EKG None  Radiology Dg Chest Portable 1 View  Result Date: 12/19/2018 CLINICAL DATA:  58 y/o M; shortness of breath, heart failure is femoral blood EXAM: PORTABLE CHEST 1 VIEW COMPARISON:  12/16/2018 chest radiograph  FINDINGS: Stable cardiomegaly given projection and technique. Aortic atherosclerosis with calcification. Interstitial pulmonary opacities and small bilateral pleural effusion are stable. No acute osseous abnormality is evident. IMPRESSION: Stable interstitial pulmonary edema and small bilateral pleural effusions. Stable cardiomegaly. Electronically Signed   By: Mitzi HansenLance  Furusawa-Stratton M.D.   On: 12/19/2018 00:33    Procedures .Critical Care Performed by: Liberty HandyGibbons, Beatris Belen J, PA-C Authorized by: Liberty HandyGibbons, Dawnya Grams J, PA-C   Critical care provider statement:    Critical care time (minutes):  45   Critical care was necessary to treat or prevent imminent or life-threatening deterioration of the following conditions:  Cardiac failure and respiratory failure (uncompensated HF, hypoxemia from pulmonary edema)   Critical care was time spent personally by me on the following activities:  Discussions with consultants, evaluation of patient's response to treatment, examination of patient, ordering and performing treatments and interventions, ordering and review of laboratory studies, ordering and review of radiographic studies, pulse oximetry, re-evaluation of patient's condition, obtaining history from patient or surrogate and review of old charts   I assumed direction of critical care for this patient from another provider in my specialty: no     (including critical care time)  Medications Ordered in ED Medications  furosemide (LASIX) injection 40 mg (40 mg Intravenous Given 12/19/18 0004)  potassium chloride SA (K-DUR) CR tablet 40 mEq (40 mEq Oral Given 12/19/18 0002)  acetaminophen (TYLENOL) tablet 650 mg (650 mg Oral Given 12/19/18 0001)     Initial Impression / Assessment and Plan / ED Course  I have reviewed the triage vital signs and the nursing notes.  Pertinent labs & imaging results that were available during my care of the patient were reviewed by me and considered in my medical decision  making (see chart for details).  Clinical Course as of Dec 18 133  Sat Dec 19, 2018  0121 His baseline, no CP   Troponin I(!!): 0.03 [CG]  0121 Hemoglobin(!): 8.7 [CG]  0121 HCT(!): 29.6 [CG]  0121 Improved from previous   B Natriuretic Peptide(!): 974.0 [CG]  0121 Glucose(!): 258 [CG]  0121 COCAINE(!): POSITIVE [CG]  0121 Tetrahydrocannabinol(!): POSITIVE [CG]  0122  IMPRESSION: Stable interstitial pulmonary edema and small bilateral pleural effusions. Stable cardiomegaly.   DG Chest Portable 1 View [CG]    Clinical Course User Index [CG] Liberty HandyGibbons, Jerzey Komperda J, PA-C      58 yo with h/o HF EF 25-30%, polysubstance abuse including cocaine, homelessness, med non compliance returns to ER for peripheral, abdominal, scrotal edema, shortness of breath.   Exam reveals hypervolemia. Tender left penile shaft lesion without overt signs of infection.  SpO2 75% on RA.  He is on 4 L Jud.   Highest on ddx is again decompensated HF. Chart review shows he again left AMA yesterday from hospital.  They were trying to find placement for him.   Labs as above support decompensated HF picture. Troponin elevated, at baseline, he has no CP. +THC and cocaine in urine. HD stable. He was given IV lasix. Will discuss with medicine team for admission. Pending COVID, last COVID on 6/17 negative but he is homeless.   Final Clinical Impressions(s) / ED Diagnoses   Final diagnoses:  Decompensated heart failure [facility](HCC)  Hypoxemia    ED Discharge Orders    None       Liberty HandyGibbons, Jerrad Mendibles J, PA-C 12/19/18 0136    Melene PlanFloyd, Dan, DO 12/19/18 0155    Melene PlanFloyd, Dan, DO 12/19/18 [phone]

## 2018-12-19 ENCOUNTER — Emergency Department (HOSPITAL_COMMUNITY): Payer: Medicare Other

## 2018-12-19 DIAGNOSIS — I5043 Acute on chronic combined systolic (congestive) and diastolic (congestive) heart failure: Secondary | ICD-10-CM | POA: Diagnosis present

## 2018-12-19 DIAGNOSIS — D649 Anemia, unspecified: Secondary | ICD-10-CM | POA: Diagnosis present

## 2018-12-19 DIAGNOSIS — G4733 Obstructive sleep apnea (adult) (pediatric): Secondary | ICD-10-CM | POA: Diagnosis present

## 2018-12-19 DIAGNOSIS — G8929 Other chronic pain: Secondary | ICD-10-CM | POA: Diagnosis present

## 2018-12-19 DIAGNOSIS — I509 Heart failure, unspecified: Secondary | ICD-10-CM

## 2018-12-19 DIAGNOSIS — E785 Hyperlipidemia, unspecified: Secondary | ICD-10-CM | POA: Diagnosis present

## 2018-12-19 DIAGNOSIS — I442 Atrioventricular block, complete: Secondary | ICD-10-CM | POA: Diagnosis present

## 2018-12-19 DIAGNOSIS — R262 Difficulty in walking, not elsewhere classified: Secondary | ICD-10-CM | POA: Diagnosis present

## 2018-12-19 DIAGNOSIS — Z833 Family history of diabetes mellitus: Secondary | ICD-10-CM | POA: Diagnosis not present

## 2018-12-19 DIAGNOSIS — I5084 End stage heart failure: Secondary | ICD-10-CM | POA: Diagnosis present

## 2018-12-19 DIAGNOSIS — Z590 Homelessness: Secondary | ICD-10-CM | POA: Diagnosis not present

## 2018-12-19 DIAGNOSIS — I110 Hypertensive heart disease with heart failure: Secondary | ICD-10-CM | POA: Diagnosis present

## 2018-12-19 DIAGNOSIS — R001 Bradycardia, unspecified: Secondary | ICD-10-CM | POA: Diagnosis present

## 2018-12-19 DIAGNOSIS — B1920 Unspecified viral hepatitis C without hepatic coma: Secondary | ICD-10-CM | POA: Diagnosis present

## 2018-12-19 DIAGNOSIS — F1210 Cannabis abuse, uncomplicated: Secondary | ICD-10-CM | POA: Diagnosis present

## 2018-12-19 DIAGNOSIS — R0902 Hypoxemia: Secondary | ICD-10-CM | POA: Diagnosis present

## 2018-12-19 DIAGNOSIS — E119 Type 2 diabetes mellitus without complications: Secondary | ICD-10-CM | POA: Diagnosis present

## 2018-12-19 DIAGNOSIS — Z794 Long term (current) use of insulin: Secondary | ICD-10-CM | POA: Diagnosis not present

## 2018-12-19 DIAGNOSIS — R7989 Other specified abnormal findings of blood chemistry: Secondary | ICD-10-CM | POA: Diagnosis not present

## 2018-12-19 DIAGNOSIS — F1420 Cocaine dependence, uncomplicated: Secondary | ICD-10-CM | POA: Diagnosis present

## 2018-12-19 DIAGNOSIS — Z87891 Personal history of nicotine dependence: Secondary | ICD-10-CM | POA: Diagnosis not present

## 2018-12-19 DIAGNOSIS — Z1159 Encounter for screening for other viral diseases: Secondary | ICD-10-CM | POA: Diagnosis not present

## 2018-12-19 DIAGNOSIS — I10 Essential (primary) hypertension: Secondary | ICD-10-CM | POA: Diagnosis not present

## 2018-12-19 DIAGNOSIS — Z8249 Family history of ischemic heart disease and other diseases of the circulatory system: Secondary | ICD-10-CM | POA: Diagnosis not present

## 2018-12-19 DIAGNOSIS — M79673 Pain in unspecified foot: Secondary | ICD-10-CM | POA: Diagnosis present

## 2018-12-19 DIAGNOSIS — F200 Paranoid schizophrenia: Secondary | ICD-10-CM | POA: Diagnosis present

## 2018-12-19 LAB — COMPREHENSIVE METABOLIC PANEL
ALT: 18 U/L (ref 0–44)
AST: 26 U/L (ref 15–41)
Albumin: 2.9 g/dL — ABNORMAL LOW (ref 3.5–5.0)
Alkaline Phosphatase: 198 U/L — ABNORMAL HIGH (ref 38–126)
Anion gap: 10 (ref 5–15)
BUN: 21 mg/dL — ABNORMAL HIGH (ref 6–20)
CO2: 30 mmol/L (ref 22–32)
Calcium: 9 mg/dL (ref 8.9–10.3)
Chloride: 101 mmol/L (ref 98–111)
Creatinine, Ser: 0.98 mg/dL (ref 0.61–1.24)
GFR calc Af Amer: >60 mL/min (ref >60)
GFR calc non Af Amer: >60 mL/min (ref >60)
Glucose, Bld: 256 mg/dL — ABNORMAL HIGH (ref 70–99)
Potassium: 4.1 mmol/L (ref 3.5–5.1)
Sodium: 141 mmol/L (ref 135–145)
Total Bilirubin: 0.8 mg/dL (ref 0.3–1.2)
Total Protein: 7.5 g/dL (ref 6.5–8.1)

## 2018-12-19 LAB — BASIC METABOLIC PANEL
Anion gap: 10 (ref 5–15)
BUN: 22 mg/dL — ABNORMAL HIGH (ref 6–20)
CO2: 27 mmol/L (ref 22–32)
Calcium: 8.7 mg/dL — ABNORMAL LOW (ref 8.9–10.3)
Chloride: 102 mmol/L (ref 98–111)
Creatinine, Ser: 1.07 mg/dL (ref 0.61–1.24)
GFR calc Af Amer: >60 mL/min (ref >60)
GFR calc non Af Amer: >60 mL/min (ref >60)
Glucose, Bld: 258 mg/dL — ABNORMAL HIGH (ref 70–99)
Potassium: 4.2 mmol/L (ref 3.5–5.1)
Sodium: 139 mmol/L (ref 135–145)

## 2018-12-19 LAB — CBC
HCT: 29.6 % — ABNORMAL LOW (ref 39.0–52.0)
Hemoglobin: 8.7 g/dL — ABNORMAL LOW (ref 13.0–17.0)
MCH: 23.3 pg — ABNORMAL LOW (ref 26.0–34.0)
MCHC: 29.4 g/dL — ABNORMAL LOW (ref 30.0–36.0)
MCV: 79.1 fL — ABNORMAL LOW (ref 80.0–100.0)
Platelets: 227 10*3/uL (ref 150–400)
RBC: 3.74 MIL/uL — ABNORMAL LOW (ref 4.22–5.81)
RDW: 21.2 % — ABNORMAL HIGH (ref 11.5–15.5)
WBC: 8.7 10*3/uL (ref 4.0–10.5)
nRBC: 0.2 % (ref 0.0–0.2)

## 2018-12-19 LAB — URINALYSIS, ROUTINE W REFLEX MICROSCOPIC
Bacteria, UA: NONE SEEN
Bilirubin Urine: NEGATIVE
Glucose, UA: NEGATIVE mg/dL
Hgb urine dipstick: NEGATIVE
Ketones, ur: NEGATIVE mg/dL
Leukocytes,Ua: NEGATIVE
Nitrite: NEGATIVE
Protein, ur: 100 mg/dL — AB
Specific Gravity, Urine: 1.016 (ref 1.005–1.030)
pH: 7 (ref 5.0–8.0)

## 2018-12-19 LAB — TROPONIN I: Troponin I: 0.03 ng/mL (ref <0.03)

## 2018-12-19 LAB — RAPID URINE DRUG SCREEN, HOSP PERFORMED
Amphetamines: NOT DETECTED
Barbiturates: NOT DETECTED
Benzodiazepines: NOT DETECTED
Cocaine: POSITIVE — AB
Opiates: NOT DETECTED
Tetrahydrocannabinol: POSITIVE — AB

## 2018-12-19 LAB — GLUCOSE, CAPILLARY
Glucose-Capillary: 200 mg/dL — ABNORMAL HIGH (ref 70–99)
Glucose-Capillary: 329 mg/dL — ABNORMAL HIGH (ref 70–99)
Glucose-Capillary: 188 mg/dL — ABNORMAL HIGH (ref 70–99)
Glucose-Capillary: 175 mg/dL — ABNORMAL HIGH (ref 70–99)

## 2018-12-19 LAB — MAGNESIUM: Magnesium: 2.1 mg/dL (ref 1.7–2.4)

## 2018-12-19 LAB — SARS CORONAVIRUS 2 BY RT PCR (HOSPITAL ORDER, PERFORMED IN ~~LOC~~ HOSPITAL LAB): SARS Coronavirus 2: NEGATIVE

## 2018-12-19 LAB — BRAIN NATRIURETIC PEPTIDE: B Natriuretic Peptide: 974 pg/mL — ABNORMAL HIGH (ref 0.0–100.0)

## 2018-12-19 MED ORDER — SODIUM CHLORIDE 0.9% FLUSH: 3.0000 mL | INTRAVENOUS | Status: DC | PRN

## 2018-12-19 MED ORDER — FUROSEMIDE 10 MG/ML IJ SOLN
60.0000 mg | Freq: Two times a day (BID) | INTRAMUSCULAR | Status: DC
  Administered 2018-12-19 – 2018-12-20 (×3): 60 mg via INTRAVENOUS
  Filled 2018-12-19 (×3): qty 6

## 2018-12-19 MED ORDER — CARBAMAZEPINE 200 MG PO TABS
100.0000 mg | ORAL_TABLET | Freq: Two times a day (BID) | ORAL | Status: DC
  Administered 2018-12-19 – 2018-12-21 (×5): 100 mg via ORAL
  Filled 2018-12-19 (×7): qty 0.5

## 2018-12-19 MED ORDER — IPRATROPIUM-ALBUTEROL 0.5-2.5 (3) MG/3ML IN SOLN
3.0000 mL | Freq: Four times a day (QID) | RESPIRATORY_TRACT | Status: DC
  Administered 2018-12-19 – 2018-12-21 (×9): 3 mL via RESPIRATORY_TRACT
  Filled 2018-12-19 (×9): qty 3

## 2018-12-19 MED ORDER — ACETAMINOPHEN 325 MG PO TABS
650.0000 mg | ORAL_TABLET | ORAL | Status: DC | PRN
  Administered 2018-12-19 – 2018-12-21 (×10): 650 mg via ORAL
  Filled 2018-12-19 (×12): qty 2

## 2018-12-19 MED ORDER — ENOXAPARIN SODIUM 40 MG/0.4ML ~~LOC~~ SOLN
40.0000 mg | SUBCUTANEOUS | Status: DC
  Administered 2018-12-19 – 2018-12-21 (×3): 40 mg via SUBCUTANEOUS
  Filled 2018-12-19 (×2): qty 0.4

## 2018-12-19 MED ORDER — SODIUM CHLORIDE 0.9% FLUSH
3.0000 mL | Freq: Two times a day (BID) | INTRAVENOUS | Status: DC
  Administered 2018-12-19 – 2018-12-21 (×5): 3 mL via INTRAVENOUS

## 2018-12-19 MED ORDER — CEPHALEXIN 500 MG PO CAPS
500.0000 mg | ORAL_CAPSULE | Freq: Three times a day (TID) | ORAL | Status: DC
  Administered 2018-12-19 – 2018-12-21 (×6): 500 mg via ORAL
  Filled 2018-12-19 (×6): qty 1

## 2018-12-19 MED ORDER — IPRATROPIUM-ALBUTEROL 20-100 MCG/ACT IN AERS: 1.0000 | INHALATION_SPRAY | Freq: Four times a day (QID) | RESPIRATORY_TRACT | Status: DC

## 2018-12-19 MED ORDER — ONDANSETRON HCL 4 MG/2ML IJ SOLN: 4.0000 mg | Freq: Four times a day (QID) | INTRAMUSCULAR | Status: DC | PRN

## 2018-12-19 MED ORDER — CEPHALEXIN 500 MG PO CAPS: 500.0000 mg | ORAL_CAPSULE | Freq: Two times a day (BID) | ORAL | Status: DC

## 2018-12-19 MED ORDER — ARIPIPRAZOLE 5 MG PO TABS
10.0000 mg | ORAL_TABLET | Freq: Every day | ORAL | Status: DC
  Administered 2018-12-19 – 2018-12-21 (×3): 10 mg via ORAL
  Filled 2018-12-19 (×3): qty 2

## 2018-12-19 MED ORDER — INSULIN ASPART 100 UNIT/ML ~~LOC~~ SOLN
0.0000 [IU] | Freq: Three times a day (TID) | SUBCUTANEOUS | Status: DC
  Administered 2018-12-19: 2 [IU] via SUBCUTANEOUS
  Administered 2018-12-19: 14:00:00 7 [IU] via SUBCUTANEOUS
  Administered 2018-12-19: 2 [IU] via SUBCUTANEOUS
  Administered 2018-12-20 (×2): 3 [IU] via SUBCUTANEOUS
  Administered 2018-12-20: 17:00:00 5 [IU] via SUBCUTANEOUS
  Administered 2018-12-21: 2 [IU] via SUBCUTANEOUS
  Administered 2018-12-21: 3 [IU] via SUBCUTANEOUS

## 2018-12-19 MED ORDER — POTASSIUM CHLORIDE CRYS ER 20 MEQ PO TBCR
20.0000 meq | EXTENDED_RELEASE_TABLET | Freq: Two times a day (BID) | ORAL | Status: DC
  Administered 2018-12-19 – 2018-12-21 (×5): 20 meq via ORAL
  Filled 2018-12-19 (×5): qty 1

## 2018-12-19 MED ORDER — NICOTINE 21 MG/24HR TD PT24
21.0000 mg | MEDICATED_PATCH | Freq: Every day | TRANSDERMAL | Status: DC
  Administered 2018-12-19 – 2018-12-21 (×3): 21 mg via TRANSDERMAL
  Filled 2018-12-19 (×3): qty 1

## 2018-12-19 MED ORDER — LISINOPRIL 10 MG PO TABS
10.0000 mg | ORAL_TABLET | Freq: Every day | ORAL | Status: DC
  Administered 2018-12-19 – 2018-12-21 (×3): 10 mg via ORAL
  Filled 2018-12-19 (×3): qty 1

## 2018-12-19 MED ORDER — INSULIN ASPART 100 UNIT/ML ~~LOC~~ SOLN
0.0000 [IU] | Freq: Every day | SUBCUTANEOUS | Status: DC
  Administered 2018-12-20: 21:00:00 2 [IU] via SUBCUTANEOUS

## 2018-12-19 MED ORDER — SODIUM CHLORIDE 0.9 % IV SOLN: 250.0000 mL | INTRAVENOUS | Status: DC | PRN

## 2018-12-19 NOTE — ED Notes (Signed)
ED TO INPATIENT HANDOFF REPORT  ED Nurse Name and Phone #: [place] Name/Age/Gender [name] 58 y.o. male Room/Bed: 033C/033C  Code Status   Code Status: Prior  Home/SNF/Other Home Patient oriented to: self, place, time and situation Is this baseline? Yes   Triage Complete: Triage complete  Chief Complaint multi complaints  Triage Note Brought by ems.  Call received patient was on side of the road using the bathroom.  Reports he was discharged from here today around noon.  C/o abdominal swelling/pain as well as swelling and pain to testicles.  Patient incontinent of stool at triage.  Ems reports also c/o SOB.  O2 sat reported at 75% on room air.  Placed on 4L by ems  Came up to 97%.   Allergies Allergies  Allergen Reactions  . Haldol [Haloperidol] Other (See Comments)    Muscle spasms, loss of voluntary movement. However, pt has taken Thorazine on multiple occasions with no adverse effects.     Level of Care/Admitting Diagnosis ED Disposition    ED Disposition Condition Comment   Admit  Hospital Area: [facility] [[id number]]  Level of Care: Telemetry Cardiac [103]  Covid Evaluation: N/A  Diagnosis: CHF exacerbation [facility]) [[id number]]  Admitting Physician: Elwyn Reach [[id number]]  Attending Physician: Elwyn Reach [[id number]]  Estimated length of stay: past midnight tomorrow  Certification:: I certify this patient will need inpatient services for at least 2 midnights  PT Class (Do Not Modify): Inpatient [101]  PT Acc Code (Do Not Modify): Private [1]       B Medical/Surgery History Past Medical History:  Diagnosis Date  . Chronic foot pain   . Cocaine abuse ([place])   . Diabetes mellitus without complication ([place])   . Hepatitis C    unsure   . Homelessness   . Hypertension   . Neuropathy   . Polysubstance abuse ([place])   . Schizophrenia ([place])   . Sleep apnea   . Systolic and diastolic CHF, chronic ([place])    Past Surgical  History:  Procedure Laterality Date  . MULTIPLE TOOTH EXTRACTIONS       A IV Location/Drains/Wounds Patient Lines/Drains/Airways Status   Active Line/Drains/Airways    Name:   Placement date:   Placement time:   Site:   Days:   Peripheral IV 12/18/18 Left Forearm   12/18/18    2318    Forearm   1   Pressure Injury 11/07/18 Stage II -  Partial thickness loss of dermis presenting as a shallow open ulcer with a red, pink wound bed without slough. small dime sized area   11/07/18    1532     42   Pressure Injury 11/07/18 Stage II -  Partial thickness loss of dermis presenting as a shallow open ulcer with a red, pink wound bed without slough. small dime sized area   11/07/18    1535     42          Intake/Output Last 24 hours No intake or output data in the 24 hours ending 12/19/18 0147  Labs/Imaging Results for orders placed or performed during the hospital encounter of 12/18/18 (from the past 48 hour(s))  Basic metabolic panel     Status: Abnormal   Collection Time: 12/18/18 11:32 PM  Result Value Ref Range   Sodium 139 135 - 145 mmol/L   Potassium 4.2 3.5 - 5.1 mmol/L   Chloride 102 98 - 111 mmol/L   CO2 27  22 - 32 mmol/L   Glucose, Bld 258 (H) 70 - 99 mg/dL   BUN 22 (H) 6 - 20 mg/dL   Creatinine, Ser 1.611.07 0.61 - 1.24 mg/dL   Calcium 8.7 (L) 8.9 - 10.3 mg/dL   GFR calc non Af Amer >60 >60 mL/min   GFR calc Af Amer >60 >60 mL/min   Anion gap 10 5 - 15    Comment: Performed at [facility], [address] [place], [place] [address]  Magnesium     Status: None   Collection Time: 12/18/18 11:32 PM  Result Value Ref Range   Magnesium 2.1 1.7 - 2.4 mg/dL    Comment: Performed at [facility], [address] [place], [place] [address]  CBC     Status: Abnormal   Collection Time: 12/18/18 11:32 PM  Result Value Ref Range   WBC 8.7 4.0 - 10.5 K/uL   RBC 3.74 (L) 4.22 - 5.81 MIL/uL   Hemoglobin 8.7 (L) 13.0 - 17.0 g/dL   HCT 11.929.6 (L) 14.739.0 - 82.952.0 %   MCV 79.1 (L)  80.0 - 100.0 fL   MCH 23.3 (L) 26.0 - 34.0 pg   MCHC 29.4 (L) 30.0 - 36.0 g/dL   RDW 56.221.2 (H) 13.011.5 - 86.515.5 %   Platelets 227 150 - 400 K/uL   nRBC 0.2 0.0 - 0.2 %    Comment: Performed at [facility], [address] [place], [place] [address]  Troponin I - ONCE - STAT     Status: Abnormal   Collection Time: 12/18/18 11:32 PM  Result Value Ref Range   Troponin I 0.03 (HH) <0.03 ng/mL    Comment: CRITICAL RESULT CALLED TO, READ BACK BY AND VERIFIED WITH: MUNNETT [facility],RN 12/19/18 0047 WAYK Performed at [facility], [address] [place], [place] [address]   Brain natriuretic peptide (order ONLY if patient c/o SOB)     Status: Abnormal   Collection Time: 12/18/18 11:50 PM  Result Value Ref Range   B Natriuretic Peptide 974.0 (H) 0.0 - 100.0 pg/mL    Comment: Performed at [facility], [address] [place], [place] [address]  Rapid urine drug screen (hospital performed)     Status: Abnormal   Collection Time: 12/18/18 11:53 PM  Result Value Ref Range   Opiates NONE DETECTED NONE DETECTED   Cocaine POSITIVE (A) NONE DETECTED   Benzodiazepines NONE DETECTED NONE DETECTED   Amphetamines NONE DETECTED NONE DETECTED   Tetrahydrocannabinol POSITIVE (A) NONE DETECTED   Barbiturates NONE DETECTED NONE DETECTED    Comment: (NOTE) DRUG SCREEN FOR MEDICAL PURPOSES ONLY.  IF CONFIRMATION IS NEEDED FOR ANY PURPOSE, NOTIFY LAB WITHIN 5 DAYS. LOWEST DETECTABLE LIMITS FOR URINE DRUG SCREEN Drug Class                     Cutoff (ng/mL) Amphetamine and metabolites    1000 Barbiturate and metabolites    200 Benzodiazepine                 200 Tricyclics and metabolites     300 Opiates and metabolites        300 Cocaine and metabolites        300 THC                            50 Performed at [facility], [address]  [place], [place] [address]   Urinalysis, Routine w reflex microscopic     Status: Abnormal   Collection Time: 12/18/18 11:53 PM  Result Value Ref  Range   Color, Urine YELLOW YELLOW   APPearance CLEAR CLEAR   Specific Gravity, Urine 1.016 1.005 - 1.030   pH 7.0 5.0 - 8.0   Glucose, UA NEGATIVE NEGATIVE mg/dL   Hgb urine dipstick NEGATIVE NEGATIVE   Bilirubin Urine NEGATIVE NEGATIVE   Ketones, ur NEGATIVE NEGATIVE mg/dL   Protein, ur 478100 (A) NEGATIVE mg/dL   Nitrite NEGATIVE NEGATIVE   Leukocytes,Ua NEGATIVE NEGATIVE   RBC / HPF 0-5 0 - 5 RBC/hpf   WBC, UA 0-5 0 - 5 WBC/hpf   Bacteria, UA NONE SEEN NONE SEEN    Comment: Performed at [facility], [address] [place], [place] [address]   Dg Chest Portable 1 View  Result Date: 12/19/2018 CLINICAL DATA:  58 y/o M; shortness of breath, heart failure is femoral blood EXAM: PORTABLE CHEST 1 VIEW COMPARISON:  12/16/2018 chest radiograph FINDINGS: Stable cardiomegaly given projection and technique. Aortic atherosclerosis with calcification. Interstitial pulmonary opacities and small bilateral pleural effusion are stable. No acute osseous abnormality is evident. IMPRESSION: Stable interstitial pulmonary edema and small bilateral pleural effusions. Stable cardiomegaly. Electronically Signed   By: Mitzi HansenLance  Furusawa-Stratton M.D.   On: 12/19/2018 00:33    Pending Labs Unresulted Labs (From admission, onward)    Start     Ordered   12/19/18 0123  SARS Coronavirus 2 (CEPHEID- Performed in [facility] hospital lab), Hosp Order  (Symptomatic Patients Labs with Precautions )  Once,   STAT    Question:  Patient immune status  Answer:  Immunocompromised   12/19/18 0122          Vitals/Pain Today's Vitals   12/18/18 2313 12/18/18 2317 12/19/18 0007 12/19/18 0100  BP:  (!) 158/110  (!) 174/95  Pulse:  (!) 101  99  Resp:  (!) 26  (!) 21  Temp:  98.3 F (36.8 C)    TempSrc:  Oral    SpO2:  96%  98%  Weight: 116.1 kg  115.7 kg   Height: 5\' 9"  (1.753 m) 5\' 9"  (1.753 m) 5\' 9"  (1.753 m)   PainSc: 8        Isolation Precautions Droplet and Contact  precautions  Medications Medications  furosemide (LASIX) injection 40 mg (40 mg Intravenous Given 12/19/18 0004)  potassium chloride SA (K-DUR) CR tablet 40 mEq (40 mEq Oral Given 12/19/18 0002)  acetaminophen (TYLENOL) tablet 650 mg (650 mg Oral Given 12/19/18 0001)    Mobility walks with device Low fall risk   Focused Assessments Edema to bilateral lower legs and scrotum   R Recommendations: See Admitting Provider Note  Report given to:   Additional Notes:

## 2018-12-19 NOTE — Progress Notes (Signed)
PROGRESS NOTE  Taylor MemosFrederick L Barrales ZOX:[id number] DOB: 03-22-1961 DOA: 12/18/2018 PCP: Patient, No Pcp Per  Brief History   [name] is a 58 y.o. male with medical history significant of end-stage congestive heart failure with combined systolic and diastolic EF of 25 to 30%, type 2 diabetes, hypertension, schizophrenia, polysubstance abuse, history of HCV, obstructive sleep apnea, morbid obesity, aggressive behavior who was recently admitted 3 days ago and left the hospital the next day.  It appears he has been leaving AGAINST MEDICAL ADVICE but patient said he was discharged.  He has been admitted at least 3 times already this month with the same problem.  Patient is homeless.  He has generalized anasarca at the moment.  He has not been compliant with his medications.  Also diet.  Patient is very anxious at the moment and not giving adequate history.  He however is complaining of inability to breathe well and has generalized swelling consistent with anasarca.  Patient has nowhere to go and apparently when he left the hospital the last time he was not set up for rehab.  He  thinks he should have gone to skilled facility..  ED Course: His temperature is 97.5 blood pressure 163/99 pulse 94 respiratory of 18 oxygen sat 85% on room air.  Sodium is 139 potassium 4.2 chloride 102 CO2 27 glucose 258 BUN 22 creatinine 1.07.  Magnesium is 2.1.  BNP 974 troponin 0 0.03.  White count is 8.7 hemoglobin 8.7 platelets 227.  Urinalysis so far negative.  COVID-19 negative.  Urine drug screen is positive for cocaine and tetrahydrocannabinol.  Chest x-ray shows stable interstitial pulmonary edema and small bilateral pleural effusions.  Patient given IV Lasix and being admitted to the hospital.  The patient was admitted to a telemetry bed. He is being diuresed.   Consultants  . None  Procedures  . None  Antibiotics  . Keflex  Subjective  The patient is awake, alert, and oriented x 3. He is  complaining of not getting the Keflex he is getting to treat a lesion on his penis.  Objective   Vitals:  Vitals:   12/19/18 1204 12/19/18 1342  BP: (!) 155/97   Pulse: (!) 105   Resp: (!) 30   Temp: (!) 97.5 F (36.4 C)   SpO2: 93% 94%    Exam:  Constitutional:  . The patient is awake, alert, and oriented x 3. Mild distress from edema. Respiratory:  . No increased work of breathing. . No wheezes or rhonchi. Positive for scattered rales throughout. . No increased work of breathing. Cardiovascular:  . Regular rate and rhythm. . No murmurs, ectopy, or gallups. . Edema of penis and scrotum . No lateral PMI. No thrills. Abdomen:  . Abdomen is distended. No tenderness.  . I am unable to evaluate the abdomen for organomegaly, hernias, or masses due to the patient's body habitus. Marland Kitchen. Positive body wall edema . Bowel sounds are distant. Musculoskeletal:  . 4+ pitting edema bilaterally. Skin:  . No rashes, lesions, ulcers . palpation of skin: no induration or nodules Neurologic:  . CN 2-12 intact . Sensation all 4 extremities intact Psychiatric:  . Mental status o Mood, affect appropriate o Orientation to person, place, time  . judgment and insight appear intact   I have personally reviewed the following:   Today's Data  . CBC, BMP, BNP  Cardiology Data  . Echo 10/2018  Scheduled Meds: . ARIPiprazole  10 mg Oral Daily  . carbamazepine  100  mg Oral BID  . enoxaparin (LOVENOX) injection  40 mg Subcutaneous Q24H  . furosemide  60 mg Intravenous BID  . insulin aspart  0-5 Units Subcutaneous QHS  . insulin aspart  0-9 Units Subcutaneous TID WC  . ipratropium-albuterol  3 mL Inhalation Q6H WA  . lisinopril  10 mg Oral Daily  . nicotine  21 mg Transdermal Daily  . potassium chloride SA  20 mEq Oral BID  . sodium chloride flush  3 mL Intravenous Q12H   Continuous Infusions: . sodium chloride      Principal Problem:   Acute on chronic combined systolic and  diastolic CHF (congestive heart failure) (HCC) Active Problems:   Insulin-requiring or dependent type II diabetes mellitus (HCC)   Essential hypertension, benign   Cocaine use disorder, severe, dependence (HCC)   Schizophrenia, paranoid type (HCC)   Homelessness   Elevated troponin   HLD (hyperlipidemia)   CHF exacerbation (HCC)   LOS: 0 days   A & P   Acute exacerbation of systolic and diastolic CHF: Recurrent due to noncompliance.  Admit the patient.  Resume diuretics. IV Lasix twice daily. Elevate his feet.   Diabetes II: Sliding scale insulin with home regimen of glipizide. Patient has not been taking glipizide apparently.  Hypertension: Continue all home medications and monitor blood pressure.  Polysubstance abuse: Including cocaine tobacco and THC. Once again counseled patient.  Schizophrenia: Continue his home psychiatric medications.  Chronically elevated troponins: Unchanged from previous numbers. Monitor on telemetry.  Hyperlipidemia: Not on statin. Patient has not been compliant in the past.  Penile lesion: Resume Keflex.  I have seen and examined this patient myself. I have spent 35 minutes in his evaluation and care.  DVT prophylaxis: Lovenox Code Status: Full code Family Communication: Patient has no family to discuss with.  Discussed care with him directly. Disposition Plan: Social work involvement to be determined   Melanie Openshaw, DO [facility]: Direct contact: see [email or web address]  7PM-7AM contact night coverage as above 12/19/2018, 3:12 PM  LOS: 0 days

## 2018-12-19 NOTE — Progress Notes (Signed)
PT Cancellation Note  Patient Details Name: [name] MRN: [id number] DOB: 06/24/1961   Cancelled Treatment:    Reason Eval/Treat Not Completed: Fatigue/lethargy limiting ability to participate;Pain limiting ability to participate.  Pt refused therapy initially for fatigue then amended to add pain as a refusal reason.  Nursing already aware and bringing him Tylenol per other nsg staff who had also communicated with him.  Follow up tomorrow.   Ramond Dial 12/19/2018, 5:09 PM  Mee Hives, PT MS Acute Rehab Dept. Number: [place] and [place]

## 2018-12-19 NOTE — H&P (Signed)
History and Physical   [name] VHQ:[id number] DOB: Nov 15, 1960 DOA: 12/18/2018  Referring MD/NP/PA: Marland Kitchen  PCP: Patient, No Pcp Per   Outpatient Specialists: Dr. Tyrone Nine  Patient coming from: Homeless  Chief Complaint: Shortness of breath  HPI: [name] is a 58 y.o. male with medical history significant of end-stage congestive heart failure with combined systolic and diastolic EF of 25 to 41%, type 2 diabetes, hypertension, schizophrenia, polysubstance abuse, history of HCV, obstructive sleep apnea, morbid obesity, aggressive behavior who was recently admitted 3 days ago and left the hospital the next day.  It appears he has been leaving [place] but patient said he was discharged.  He has been admitted at least 3 times already this month with the same problem.  Patient is homeless.  He has generalized anasarca at the moment.  He has not been compliant with his medications.  Also diet.  Patient is very anxious at the moment and not giving adequate history.  He however is complaining of inability to breathe well and has generalized swelling consistent with anasarca.  Patient has nowhere to go and apparently when he left the hospital the last time he was not set up for rehab.  He  thinks he should have gone to skilled facility..  ED Course: His temperature is 97.5 blood pressure 163/99 pulse 94 respiratory of 18 oxygen sat 85% on room air.  Sodium is 139 potassium 4.2 chloride 102 CO2 27 glucose 258 BUN 22 creatinine 1.07.  Magnesium is 2.1.  BNP 974 troponin 0 0.03.  White count is 8.7 hemoglobin 8.7 platelets 227.  Urinalysis so far negative.  COVID-19 negative.  Urine drug screen is positive for cocaine and tetrahydrocannabinol.  Chest x-ray shows stable interstitial pulmonary edema and small bilateral pleural effusions.  Patient given IV Lasix and being admitted to the hospital.  Review of Systems: As per HPI otherwise 10 point review of systems negative.    Past  Medical History:  Diagnosis Date  . Chronic foot pain   . Cocaine abuse ([place])   . Diabetes mellitus without complication ([place])   . Hepatitis C    unsure   . Homelessness   . Hypertension   . Neuropathy   . Polysubstance abuse ([place])   . Schizophrenia ([place])   . Sleep apnea   . Systolic and diastolic CHF, chronic ([place])     Past Surgical History:  Procedure Laterality Date  . MULTIPLE TOOTH EXTRACTIONS       reports that he has been smoking cigarettes. He has a 20.00 pack-year smoking history. He uses smokeless tobacco. He reports current alcohol use. He reports current drug use. Frequency: 7.00 times per week. Drugs: "Crack" cocaine, Cocaine, and Marijuana.  Allergies  Allergen Reactions  . Haldol [Haloperidol] Other (See Comments)    Muscle spasms, loss of voluntary movement. However, pt has taken Thorazine on multiple occasions with no adverse effects.     Family History  Problem Relation Age of Onset  . Hypertension Other   . Diabetes Other      Prior to Admission medications   Medication Sig Start Date End Date Taking? Authorizing Provider  ARIPiprazole (ABILIFY) 10 MG tablet Take 1 tablet (10 mg total) by mouth daily. 12/10/18  Yes Pokhrel, Laxman, MD  carbamazepine (TEGRETOL) 200 MG tablet Take 0.5 tablets (100 mg total) by mouth 2 (two) times daily. 11/26/18  Yes Daleen Bo, MD  furosemide (LASIX) 40 MG tablet Take 1 tablet (40 mg total) by  mouth 2 (two) times daily for 30 days. 11/26/18 12/26/18 Yes Mancel BaleWentz, Elliott, MD  Ipratropium-Albuterol (COMBIVENT) 20-100 MCG/ACT AERS respimat Inhale 1 puff into the lungs every 6 (six) hours. 11/30/18  Yes Palumbo, April, MD  lisinopril (ZESTRIL) 10 MG tablet Take 1 tablet (10 mg total) by mouth daily. 11/26/18  Yes Mancel BaleWentz, Elliott, MD  potassium chloride SA (K-DUR) 20 MEQ tablet Take 1 tablet (20 mEq total) by mouth 2 (two) times daily. 11/30/18  Yes Palumbo, April, MD  cephALEXin (KEFLEX) 500 MG capsule Take 1 capsule (500 mg total)  by mouth 3 (three) times daily. Patient not taking: Reported on 12/19/2018 12/18/18   Alwyn RenMathews, Elizabeth G, MD  glipiZIDE (GLUCOTROL) 5 MG tablet Take 0.5 tablets (2.5 mg total) by mouth 2 (two) times daily before a meal. Patient not taking: Reported on 12/19/2018 12/18/18   Alwyn RenMathews, Elizabeth G, MD    Physical Exam: Vitals:   12/18/18 2317 12/19/18 0007 12/19/18 0100 12/19/18 0205  BP: (!) 158/110  (!) 174/95 (!) 171/108  Pulse: (!) 101  99 94  Resp: (!) 26  (!) 21 (!) 21  Temp: 98.3 F (36.8 C)     TempSrc: Oral     SpO2: 96%  98% 96%  Weight:  115.7 kg    Height: 5\' 9"  (1.753 m) 5\' 9"  (1.753 m)        Constitutional: Disheveled, chronically ill looking, morbidly obese Vitals:   12/18/18 2317 12/19/18 0007 12/19/18 0100 12/19/18 0205  BP: (!) 158/110  (!) 174/95 (!) 171/108  Pulse: (!) 101  99 94  Resp: (!) 26  (!) 21 (!) 21  Temp: 98.3 F (36.8 C)     TempSrc: Oral     SpO2: 96%  98% 96%  Weight:  115.7 kg    Height: 5\' 9"  (1.753 m) 5\' 9"  (1.753 m)     Eyes: PERRL, lids and conjunctivae normal ENMT: Mucous membranes are moist. Posterior pharynx clear of any exudate or lesions.Normal dentition.  Neck: Positive JVD up to the angle of the jaw, normal, supple, no masses, no thyromegaly Respiratory: Decreased air entry bilaterally with bilateral crackles with no wheeze normal respiratory effort. No accessory muscle use.  Cardiovascular: Sinus tachycardia no murmurs / rubs / gallops. 2+extremity edema. 2+ pedal pulses. No carotid bruits.  Abdomen: no tenderness, no masses palpated. No hepatosplenomegaly. Bowel sounds positive.  Scrotal swelling Musculoskeletal: no clubbing / cyanosis. No joint deformity upper and lower extremities. Good ROM, no contractures. Normal muscle tone.  Skin: no rashes, lesions, ulcers. No induration Neurologic: CN 2-12 grossly intact. Sensation intact, DTR normal. Strength 5/5 in all 4.  Psychiatric: Normal judgment and insight. Alert and oriented x 3.   Very anxious and aggressive    Labs on Admission: I have personally reviewed following labs and imaging studies  CBC: Recent Labs  Lab 12/16/18 0415 12/17/18 0404 12/18/18 2332  WBC 12.0* 7.5 8.7  NEUTROABS 10.5* 5.9  --   HGB 9.0* 8.4* 8.7*  HCT 31.6* 29.3* 29.6*  MCV 81.4 80.7 79.1*  PLT 213 176 227   Basic Metabolic Panel: Recent Labs  Lab 12/15/18 0434 12/16/18 0415 12/17/18 0404 12/18/18 0625 12/18/18 2332  NA 138 138 138 139 139  K 4.1 4.4 4.3 4.3 4.2  CL 99 100 101 100 102  CO2 31 27 31 30 27   GLUCOSE 193* 272* 255* 231* 258*  BUN 21* 29* 33* 25* 22*  CREATININE 0.91 1.17 1.11 0.90 1.07  CALCIUM 8.9 8.9 8.7* 8.7*  8.7*  MG  --   --   --   --  2.1   GFR: Estimated Creatinine Clearance: 95.6 mL/min (by C-G formula based on SCr of 1.07 mg/dL). Liver Function Tests: No results for input(s): AST, ALT, ALKPHOS, BILITOT, PROT, ALBUMIN in the last 168 hours. No results for input(s): LIPASE, AMYLASE in the last 168 hours. No results for input(s): AMMONIA in the last 168 hours. Coagulation Profile: No results for input(s): INR, PROTIME in the last 168 hours. Cardiac Enzymes: Recent Labs  Lab 12/16/18 0415 12/16/18 0831 12/16/18 1645 12/16/18 2118 12/18/18 2332  TROPONINI 0.03* 0.04* 0.03* 0.03* 0.03*   BNP (last 3 results) No results for input(s): PROBNP in the last 8760 hours. HbA1C: No results for input(s): HGBA1C in the last 72 hours. CBG: Recent Labs  Lab 12/17/18 1148 12/17/18 1610 12/17/18 2048 12/18/18 0625 12/18/18 0640  GLUCAP 182* 156* 185* 218* 241*   Lipid Profile: No results for input(s): CHOL, HDL, LDLCALC, TRIG, CHOLHDL, LDLDIRECT in the last 72 hours. Thyroid Function Tests: No results for input(s): TSH, T4TOTAL, FREET4, T3FREE, THYROIDAB in the last 72 hours. Anemia Panel: No results for input(s): VITAMINB12, FOLATE, FERRITIN, TIBC, IRON, RETICCTPCT in the last 72 hours. Urine analysis:    Component Value Date/Time    COLORURINE YELLOW 12/18/2018 2353   APPEARANCEUR CLEAR 12/18/2018 2353   LABSPEC 1.016 12/18/2018 2353   PHURINE 7.0 12/18/2018 2353   GLUCOSEU NEGATIVE 12/18/2018 2353   HGBUR NEGATIVE 12/18/2018 2353   BILIRUBINUR NEGATIVE 12/18/2018 2353   KETONESUR NEGATIVE 12/18/2018 2353   PROTEINUR 100 (A) 12/18/2018 2353   UROBILINOGEN 1.0 03/14/2015 0610   NITRITE NEGATIVE 12/18/2018 2353   LEUKOCYTESUR NEGATIVE 12/18/2018 2353   Sepsis Labs: (procalcitonin:4,lacticidven:4) ) Recent Results (from the past 240 hour(s))  SARS Coronavirus 2     Status: None   Collection Time: 12/11/18 12:52 AM  Result Value Ref Range Status   SARS Coronavirus 2 NOT DETECTED NOT DETECTED Final    Comment: (NOTE) SARS-CoV-2 target nucleic acids are NOT DETECTED. The SARS-CoV-2 RNA is generally detectable in upper and lower respiratory specimens during the acute phase of infection.  Negative  results do not preclude SARS-CoV-2 infection, do not rule out co-infections with other pathogens, and should not be used as the sole basis for treatment or other patient management decisions.  Negative results must be combined with clinical observations, patient history, and epidemiological information. The expected result is Not Detected. Fact Sheet for Patients: http://www.biofiredefense.com/wp-content/uploads/2020/03/BIOFIRE-COVID -19-patients.pdf Fact Sheet for Healthcare Providers: http://www.biofiredefense.com/wp-content/uploads/2020/03/BIOFIRE-COVID -19-hcp.pdf This test is not yet approved or cleared by the [place] and  has been authorized for detection and/or diagnosis of SARS-CoV-2 by FDA under an Emergency Use Authorization (EUA).  This EUA will remain in effec t (meaning this test can be used) for the duration of  the COVID-19 declaration under Section 564(b)(1) of the Act, 21 U.S.C. section 360bbb-3(b)(1), unless the authorization is terminated or revoked sooner. Performed at [facility], [address], [place], [place] [address]   SARS Coronavirus 2     Status: None   Collection Time: 12/16/18  4:25 AM  Result Value Ref Range Status   SARS Coronavirus 2 NOT DETECTED NOT DETECTED Final    Comment: (NOTE) SARS-CoV-2 target nucleic acids are NOT DETECTED. The SARS-CoV-2 RNA is generally detectable in upper and lower respiratory specimens during the acute phase of infection.  Negative  results do not preclude SARS-CoV-2 infection, do not rule out co-infections with  other pathogens, and should not be used as the sole basis for treatment or other patient management decisions.  Negative results must be combined with clinical observations, patient history, and epidemiological information. The expected result is Not Detected. Fact Sheet for Patients: http://www.biofiredefense.com/wp-content/uploads/2020/03/BIOFIRE-COVID -19-patients.pdf Fact Sheet for Healthcare Providers: http://www.biofiredefense.com/wp-content/uploads/2020/03/BIOFIRE-COVID -19-hcp.pdf This test is not yet approved or cleared by the [place] FDA and  has been authorized for detection and/or diagnosis of SARS-CoV-2 by FDA under an Emergency Use Authorization (EUA).  This EUA will remain in effec t (meaning this test can be used) for the duration of  the COVID-19 declaration under Section 564(b)(1) of the Act, 21 U.S.C. section 360bbb-3(b)(1), unless the authorization is terminated or revoked sooner. Performed at [facility], [address] [place], [place] [address]      Radiological Exams on Admission: Dg Chest Portable 1 View  Result Date: 12/19/2018 CLINICAL DATA:  58 y/o M; shortness of breath, heart failure is femoral blood EXAM: PORTABLE CHEST 1 VIEW COMPARISON:  12/16/2018 chest radiograph FINDINGS: Stable cardiomegaly given projection and technique. Aortic atherosclerosis with calcification. Interstitial pulmonary opacities and small bilateral pleural effusion are  stable. No acute osseous abnormality is evident. IMPRESSION: Stable interstitial pulmonary edema and small bilateral pleural effusions. Stable cardiomegaly. Electronically Signed   By: Mitzi HansenLance  Furusawa-Stratton M.D.   On: 12/19/2018 00:33    EKG: Independently reviewed.  Sinus tachycardia with low voltage.  No significant ST changes.  No significant change from previous.  Assessment/Plan Principal Problem:   Acute on chronic combined systolic and diastolic CHF (congestive heart failure) (HCC) Active Problems:   Insulin-requiring or dependent type II diabetes mellitus (HCC)   Essential hypertension, benign   Cocaine use disorder, severe, dependence (HCC)   Schizophrenia, paranoid type (HCC)   Homelessness   Elevated troponin   HLD (hyperlipidemia)   CHF exacerbation (HCC)     #1 acute exacerbation of systolic and diastolic CHF: Recurrent due to noncompliance.  Admit the patient.  Resume diuretics.  IV Lasix twice daily.  Elevate his feet.  Counseled patient once again.  #2 diabetes: Sliding scale insulin with home regimen of glipizide.  Patient has not been taking glipizide apparently.  #3 hypertension: Continue all home medications and monitor blood pressure.  #4 polysubstance abuse: Including cocaine tobacco and THC.  Once again counseled patient.  #5 schizophrenia: Continue his home psychiatric medications.  #6 chronically elevated troponins: Unchanged from previous numbers.  Monitor on telemetry.  #7 hyperlipidemia: Not on statin.  Patient has not been compliant in the past.  DVT prophylaxis: Lovenox Code Status: Full code Family Communication: Patient has no family to discuss with.  Discussed care with him directly. Disposition Plan: Social work involvement to be determined Consults called: None Admission status: Inpatient  Severity of Illness: The appropriate patient status for this patient is INPATIENT. Inpatient status is judged to be reasonable and necessary in order  to provide the required intensity of service to ensure the patient's safety. The patient's presenting symptoms, physical exam findings, and initial radiographic and laboratory data in the context of their chronic comorbidities is felt to place them at high risk for further clinical deterioration. Furthermore, it is not anticipated that the patient will be medically stable for discharge from the hospital within 2 midnights of admission. The following factors support the patient status of inpatient.   " The patient's presenting symptoms include shortness of breath and swelling. " The worrisome physical exam findings include anasarca. " The initial radiographic and  laboratory data are worrisome because of chest x-ray showed bilateral edema. " The chronic co-morbidities include combined systolic and diastolic CHF.   * I certify that at the point of admission it is my clinical judgment that the patient will require inpatient hospital care spanning beyond 2 midnights from the point of admission due to high intensity of service, high risk for further deterioration and high frequency of surveillance required.Lonia Blood*    Suzan Manon,LAWAL MD [facility] Pager [phone]  If 7PM-7AM, please contact night-coverage [email or web address] Password [email or web address]  12/19/2018, 2:38 AM

## 2018-12-20 LAB — CBC WITH DIFFERENTIAL/PLATELET
Abs Immature Granulocytes: 0.02 10*3/uL (ref 0.00–0.07)
Basophils Absolute: 0 10*3/uL (ref 0.0–0.1)
Basophils Relative: 1 %
Eosinophils Absolute: 0.3 10*3/uL (ref 0.0–0.5)
Eosinophils Relative: 4 %
HCT: 28.9 % — ABNORMAL LOW (ref 39.0–52.0)
Hemoglobin: 8.3 g/dL — ABNORMAL LOW (ref 13.0–17.0)
Immature Granulocytes: 0 %
Lymphocytes Relative: 10 %
Lymphs Abs: 0.7 10*3/uL (ref 0.7–4.0)
MCH: 23.1 pg — ABNORMAL LOW (ref 26.0–34.0)
MCHC: 28.7 g/dL — ABNORMAL LOW (ref 30.0–36.0)
MCV: 80.5 fL (ref 80.0–100.0)
Monocytes Absolute: 0.7 10*3/uL (ref 0.1–1.0)
Monocytes Relative: 10 %
Neutro Abs: 4.9 10*3/uL (ref 1.7–7.7)
Neutrophils Relative %: 75 %
Platelets: 203 10*3/uL (ref 150–400)
RBC: 3.59 MIL/uL — ABNORMAL LOW (ref 4.22–5.81)
RDW: 21.1 % — ABNORMAL HIGH (ref 11.5–15.5)
WBC: 6.7 10*3/uL (ref 4.0–10.5)
nRBC: 0 % (ref 0.0–0.2)

## 2018-12-20 LAB — BASIC METABOLIC PANEL
Anion gap: 10 (ref 5–15)
BUN: 21 mg/dL — ABNORMAL HIGH (ref 6–20)
CO2: 30 mmol/L (ref 22–32)
Calcium: 8.7 mg/dL — ABNORMAL LOW (ref 8.9–10.3)
Chloride: 99 mmol/L (ref 98–111)
Creatinine, Ser: 0.94 mg/dL (ref 0.61–1.24)
GFR calc Af Amer: >60 mL/min (ref >60)
GFR calc non Af Amer: >60 mL/min (ref >60)
Glucose, Bld: 264 mg/dL — ABNORMAL HIGH (ref 70–99)
Potassium: 3.9 mmol/L (ref 3.5–5.1)
Sodium: 139 mmol/L (ref 135–145)

## 2018-12-20 LAB — GLUCOSE, CAPILLARY
Glucose-Capillary: 203 mg/dL — ABNORMAL HIGH (ref 70–99)
Glucose-Capillary: 207 mg/dL — ABNORMAL HIGH (ref 70–99)
Glucose-Capillary: 264 mg/dL — ABNORMAL HIGH (ref 70–99)
Glucose-Capillary: 214 mg/dL — ABNORMAL HIGH (ref 70–99)

## 2018-12-20 MED ORDER — FUROSEMIDE 10 MG/ML IJ SOLN
60.0000 mg | Freq: Two times a day (BID) | INTRAMUSCULAR | Status: DC
  Administered 2018-12-20 – 2018-12-21 (×2): 60 mg via INTRAVENOUS
  Filled 2018-12-20 (×2): qty 6

## 2018-12-20 MED ORDER — ALUM & MAG HYDROXIDE-SIMETH 200-200-20 MG/5ML PO SUSP: 30.0000 mL | ORAL | Status: DC | PRN

## 2018-12-20 NOTE — Consult Note (Signed)
Cardiology Consultation:   Patient ID: [name] [facility] MRN: [id number]; DOB: 1961/04/12  Admit date: 12/18/2018 Date of Consult: 12/20/2018  Primary Care Provider: Patient, No Pcp Per Primary Cardiologist: Kristeen MissPhilip Nahser, MD  Primary Electrophysiologist:  None none   Patient Profile:   [name] Guest is a 58 y.o. male with a hx of chronic systolic heart failure who is being seen today for the evaluation of transient high grade heart block at the request of Dr. Gerri LinsSwayze.  History of Present Illness:   [name] history is notable for over 70 visits to the emergency department in the past year.  The patient has been homeless during this time and has mental health issues.  He carries a diagnosis of schizophrenia.  Previously he lived with his father who has died.  Over the last several weeks to months, he has had progressive lower extremity swelling and shortness of breath.  No syncope.  He has noted scrotal edema as well.  He has had difficulty with urination.  He denies fever or chills.  The patient was noted to have worsening edema and shortness of breath and was admitted for evaluation.  On telemetry, he has been noted to have transient high-grade heart block, mostly Mobitz 2 second-degree AV block.  He has been asymptomatic with this.  The patient has been treated with diuretic therapy.  He is quite anxious about being discharged without a place to live.  He notes that his scrotum and penis have been swollen for several weeks.  Past Medical History:  Diagnosis Date  . Chronic foot pain   . Cocaine abuse (HCC)   . Diabetes mellitus without complication (HCC)   . Hepatitis C    unsure   . Homelessness   . Hypertension   . Neuropathy   . Polysubstance abuse (HCC)   . Schizophrenia (HCC)   . Sleep apnea   . Systolic and diastolic CHF, chronic (HCC)     Past Surgical History:  Procedure Laterality Date  . MULTIPLE TOOTH EXTRACTIONS       Home Medications:  Prior to  Admission medications   Medication Sig Start Date End Date Taking? Authorizing Provider  ARIPiprazole (ABILIFY) 10 MG tablet Take 1 tablet (10 mg total) by mouth daily. 12/10/18  Yes Pokhrel, Laxman, MD  carbamazepine (TEGRETOL) 200 MG tablet Take 0.5 tablets (100 mg total) by mouth 2 (two) times daily. 11/26/18  Yes Mancel BaleWentz, Elliott, MD  furosemide (LASIX) 40 MG tablet Take 1 tablet (40 mg total) by mouth 2 (two) times daily for 30 days. 11/26/18 12/26/18 Yes Mancel BaleWentz, Elliott, MD  Ipratropium-Albuterol (COMBIVENT) 20-100 MCG/ACT AERS respimat Inhale 1 puff into the lungs every 6 (six) hours. 11/30/18  Yes Palumbo, April, MD  lisinopril (ZESTRIL) 10 MG tablet Take 1 tablet (10 mg total) by mouth daily. 11/26/18  Yes Mancel BaleWentz, Elliott, MD  potassium chloride SA (K-DUR) 20 MEQ tablet Take 1 tablet (20 mEq total) by mouth 2 (two) times daily. 11/30/18  Yes Palumbo, April, MD  cephALEXin (KEFLEX) 500 MG capsule Take 1 capsule (500 mg total) by mouth 3 (three) times daily. Patient not taking: Reported on 12/19/2018 12/18/18   Alwyn RenMathews, Elizabeth G, MD  glipiZIDE (GLUCOTROL) 5 MG tablet Take 0.5 tablets (2.5 mg total) by mouth 2 (two) times daily before a meal. Patient not taking: Reported on 12/19/2018 12/18/18   Alwyn RenMathews, Elizabeth G, MD    Inpatient Medications: Scheduled Meds: . ARIPiprazole  10 mg Oral Daily  . carbamazepine  100  mg Oral BID  . cephALEXin  500 mg Oral Q8H  . enoxaparin (LOVENOX) injection  40 mg Subcutaneous Q24H  . furosemide  60 mg Intravenous Q12H  . insulin aspart  0-5 Units Subcutaneous QHS  . insulin aspart  0-9 Units Subcutaneous TID WC  . ipratropium-albuterol  3 mL Inhalation Q6H WA  . lisinopril  10 mg Oral Daily  . nicotine  21 mg Transdermal Daily  . potassium chloride SA  20 mEq Oral BID  . sodium chloride flush  3 mL Intravenous Q12H   Continuous Infusions: . sodium chloride     PRN Meds: sodium chloride, acetaminophen, ondansetron (ZOFRAN) IV, sodium chloride flush   Allergies:    Allergies  Allergen Reactions  . Haldol [Haloperidol] Other (See Comments)    Muscle spasms, loss of voluntary movement. However, pt has taken Thorazine on multiple occasions with no adverse effects.     Social History:   Social History   Socioeconomic History  . Marital status: Single    Spouse name: Not on file  . Number of children: Not on file  . Years of education: Not on file  . Highest education level: Not on file  Occupational History  . Not on file  Social Needs  . Financial resource strain: Not on file  . Food insecurity    Worry: Not on file    Inability: Not on file  . Transportation needs    Medical: Not on file    Non-medical: Not on file  Tobacco Use  . Smoking status: Current Every Day Smoker    Packs/day: 1.00    Years: 20.00    Pack years: 20.00    Types: Cigarettes  . Smokeless tobacco: Current User  Substance and Sexual Activity  . Alcohol use: Yes    Comment: Daily Drinker   . Drug use: Yes    Frequency: 7.0 times per week    Types: "Crack" cocaine, Cocaine, Marijuana    Comment: used cocaine 11-27-18  . Sexual activity: Not Currently  Lifestyle  . Physical activity    Days per week: Not on file    Minutes per session: Not on file  . Stress: Not on file  Relationships  . Social Musicianconnections    Talks on phone: Not on file    Gets together: Not on file    Attends religious service: Not on file    Active member of club or organization: Not on file    Attends meetings of clubs or organizations: Not on file    Relationship status: Not on file  . Intimate partner violence    Fear of current or ex partner: Not on file    Emotionally abused: Not on file    Physically abused: Not on file    Forced sexual activity: Not on file  Other Topics Concern  . Not on file  Social History Narrative   ** Merged History Encounter **        Family History:    Family History  Problem Relation Age of Onset  . Hypertension Other   .  Diabetes Other      ROS:  Please see the history of present illness.   All other ROS reviewed and negative.     Physical Exam/Data:   Vitals:   12/20/18 0603 12/20/18 0807 12/20/18 0811 12/20/18 1140  BP: (!) 166/104 (!) 161/103  (!) 166/86  Pulse: 96 94 (!) 104 96  Resp:  19 18 13   Temp: 97.6  F (36.4 C)   97.9 F (36.6 C)  TempSrc: Oral   Oral  SpO2: 93% 94% 94% 92%  Weight:      Height:        Intake/Output Summary (Last 24 hours) at 12/20/2018 1423 Last data filed at 12/20/2018 1100 Gross per 24 hour  Intake 840 ml  Output 4800 ml  Net -3960 ml   Last 3 Weights 12/20/2018 12/19/2018 12/19/2018  Weight (lbs) 239 lb 1.6 oz 251 lb 11.2 oz 255 lb  Weight (kg) 108.455 kg 114.17 kg 115.667 kg  Some encounter information is confidential and restricted. Go to Review Flowsheets activity to see all data.     Body mass index is 35.31 kg/m.  General:  Well nourished, well developed, in no acute distress HEENT: normal Lymph: no adenopathy Neck: 8 cm JVD Endocrine:  No thryomegaly Vascular: No carotid bruits; FA pulses 2+ bilaterally without bruits  Cardiac:  RRR with a soft S3 gallop.;  Grade 1/6 systolic murmur at the base Lungs:  clear to auscultation bilaterally, no wheezing, rhonchi or rales  Abd: soft, nontender, no hepatomegaly  Ext: 2+ peripheral edema, bilaterally Musculoskeletal:  No deformities, BUE and BLE strength normal and equal Skin: warm and dry  Neuro:  CNs 2-12 intact, no focal abnormalities noted Psych: Anxious appearing  EKG:  The EKG was personally reviewed and demonstrates: Normal sinus rhythm with a short PR Telemetry:  Telemetry was personally reviewed and demonstrates: Normal sinus rhythm with occasional PVCs  Relevant CV Studies: 2D echo reviewed demonstrating EF 25%  Laboratory Data:  Chemistry Recent Labs  Lab 12/18/18 2332 12/19/18 1115 12/20/18 1022  NA 139 141 139  K 4.2 4.1 3.9  CL 102 101 99  CO2 27 30 30   GLUCOSE 258* 256*  264*  BUN 22* 21* 21*  CREATININE 1.07 0.98 0.94  CALCIUM 8.7* 9.0 8.7*  GFRNONAA >60 >60 >60  GFRAA >60 >60 >60  ANIONGAP 10 10 10     Recent Labs  Lab 12/19/18 1115  PROT 7.5  ALBUMIN 2.9*  AST 26  ALT 18  ALKPHOS 198*  BILITOT 0.8   Hematology Recent Labs  Lab 12/17/18 0404 12/18/18 2332 12/20/18 1022  WBC 7.5 8.7 6.7  RBC 3.63* 3.74* 3.59*  HGB 8.4* 8.7* 8.3*  HCT 29.3* 29.6* 28.9*  MCV 80.7 79.1* 80.5  MCH 23.1* 23.3* 23.1*  MCHC 28.7* 29.4* 28.7*  RDW 21.1* 21.2* 21.1*  PLT 176 227 203   Cardiac Enzymes Recent Labs  Lab 12/16/18 0415 12/16/18 0831 12/16/18 1645 12/16/18 2118 12/18/18 2332  TROPONINI 0.03* 0.04* 0.03* 0.03* 0.03*   No results for input(s): TROPIPOC in the last 168 hours.  BNP Recent Labs  Lab 12/16/18 0415 12/18/18 2350  BNP 1,591.9* 974.0*    DDimer No results for input(s): DDIMER in the last 168 hours.  Radiology/Studies:  Dg Chest Portable 1 View  Result Date: 12/19/2018 CLINICAL DATA:  58 y/o M; shortness of breath, heart failure is femoral blood EXAM: PORTABLE CHEST 1 VIEW COMPARISON:  12/16/2018 chest radiograph FINDINGS: Stable cardiomegaly given projection and technique. Aortic atherosclerosis with calcification. Interstitial pulmonary opacities and small bilateral pleural effusion are stable. No acute osseous abnormality is evident. IMPRESSION: Stable interstitial pulmonary edema and small bilateral pleural effusions. Stable cardiomegaly. Electronically Signed   By: Kristine Garbe M.D.   On: 12/19/2018 00:33    Assessment and Plan:   1. Transient asymptomatic high-grade heart block -the patient has no evidence of conduction system disease as  he has a narrow QRS and short PR at baseline.  He will undergo watchful waiting.  With his multiple comorbidities, pacing is a particularly poor option for this patient. 2.   Chronic systolic heart failure -he has severe LV dysfunction, noncompliance, and his volume overloaded.   I would recommend Lasix therapy as well as low-dose losartan.  He is a poor candidate for beta-blocker therapy with his transient heart block. 3.  Homelessness -I think his homeless situation and psychological difficulties are strongly affecting his other medical problems.  He will need a social work consult to try and help him with this.    For questions or updates, please contact [facility] Please consult [email or web address] for contact info under   Signed, Lewayne BuntingGregg Khiley Lieser, MD  12/20/2018 2:23 PM

## 2018-12-20 NOTE — Progress Notes (Addendum)
Received call from central telemetry about patient's heart rate dipping into 30s. Strip was saved by central telemetry. Patient's rhythm appeared to briefly change to third degree heart block at this time. Currently, patient is lying in bed, asymptomatic, heart rate sinus rhythm in the 90s. Text paged Dr. Benny Lennert at this time with above information.  Update 1120: able to capture heart block episode on 12-lead EKG. Episodes are transient and occur when patient is having an apneic period while sleeping. Paged Dr. Benny Lennert with update.

## 2018-12-20 NOTE — Progress Notes (Signed)
PT Cancellation Note  Patient Details Name: MATHIEU SCHLOEMER MRN: [id number] DOB: 1961-02-23   Cancelled Treatment:    Reason Eval/Treat Not Completed: Other (comment)   Politely declining OOB/activity for PT eval, requesting cream for his penis and scrotum; RN aware;   Will reattempt;   Roney Marion, PT  Acute Rehabilitation Services Pager [phone] Office [phone]   [place] 12/20/2018, 9:39 AM

## 2018-12-20 NOTE — Progress Notes (Addendum)
PROGRESS NOTE  [name]:[id number] DOB: [birth year] DOA: 12/18/2018 PCP: Patient, No Pcp Per  Brief History   SELDEN [name] is a 58 y.o. male with medical history significant of end-stage congestive heart failure with combined systolic and diastolic EF of 25 to 73%, type 2 diabetes, hypertension, schizophrenia, polysubstance abuse, history of HCV, obstructive sleep apnea, morbid obesity, aggressive behavior who was recently admitted 3 days ago and left the hospital the next day.  It appears he has been leaving [name] but patient said he was discharged.  He has been admitted at least 3 times already this month with the same problem.  Patient is homeless.  He has generalized anasarca at the moment.  He has not been compliant with his medications.  Also diet.  Patient is very anxious at the moment and not giving adequate history.  He however is complaining of inability to breathe well and has generalized swelling consistent with anasarca.  Patient has nowhere to go and apparently when he left the hospital the last time he was not set up for rehab.  He  thinks he should have gone to skilled facility..  ED Course: His temperature is 97.5 blood pressure 163/99 pulse 94 respiratory of 18 oxygen sat 85% on room air.  Sodium is 139 potassium 4.2 chloride 102 CO2 27 glucose 258 BUN 22 creatinine 1.07.  Magnesium is 2.1.  BNP 974 troponin 0 0.03.  White count is 8.7 hemoglobin 8.7 platelets 227.  Urinalysis so far negative.  COVID-19 negative.  Urine drug screen is positive for cocaine and tetrahydrocannabinol.  Chest x-ray shows stable interstitial pulmonary edema and small bilateral pleural effusions.  Patient given IV Lasix and being admitted to the hospital.  The patient was admitted to a telemetry bed. He is being diuresed.   Consultants  . None  Procedures  . None  Antibiotics  . Keflex  Subjective  The patient is somnolent. This morning he was found to have a  heart rate in the 40's and EKG demonstrated a 3rd degree heart block.  Objective   Vitals:  Vitals:   12/20/18 1140 12/20/18 1432  BP: (!) 166/86   Pulse: 96 (!) 108  Resp: 13 20  Temp: 97.9 F (36.6 C)   SpO2: 92% 91%    Exam:  Constitutional:  . The patient is somnolent, but arousable. Mild distress from edema. Respiratory:  . No increased work of breathing. . No wheezes or rhonchi. Positive for rales primarily at bases. . No increased work of breathing. Cardiovascular:  . Regular rate and rhythm. . No murmurs, ectopy, or gallups. . Edema of penis and scrotum . No lateral PMI. No thrills. Abdomen:  . Abdomen is distended. No tenderness.  . I am unable to evaluate the abdomen for organomegaly, hernias, or masses due to the patient's body habitus. Marland Kitchen Positive body wall edema . Bowel sounds are distant. Musculoskeletal:  . 4+ pitting edema bilaterally. Skin:  . No rashes, lesions, ulcers . palpation of skin: no induration or nodules Neurologic:  . CN 2-12 intact . Sensation all 4 extremities intact Psychiatric:  . Mental status o Mood, affect appropriate o Orientation to person, place, time  . judgment and insight appear intact   I have personally reviewed the following:   Today's Data  . CBC, BMP, BNP  Cardiology Data  . Echo 10/2018  Scheduled Meds: . ARIPiprazole  10 mg Oral Daily  . carbamazepine  100 mg Oral BID  . cephALEXin  500 mg Oral Q8H  . enoxaparin (LOVENOX) injection  40 mg Subcutaneous Q24H  . furosemide  60 mg Intravenous Q12H  . insulin aspart  0-5 Units Subcutaneous QHS  . insulin aspart  0-9 Units Subcutaneous TID WC  . ipratropium-albuterol  3 mL Inhalation Q6H WA  . lisinopril  10 mg Oral Daily  . nicotine  21 mg Transdermal Daily  . potassium chloride SA  20 mEq Oral BID  . sodium chloride flush  3 mL Intravenous Q12H   Continuous Infusions: . sodium chloride      Principal Problem:   Acute on chronic combined systolic and  diastolic CHF (congestive heart failure) (HCC) Active Problems:   Insulin-requiring or dependent type II diabetes mellitus (HCC)   Essential hypertension, benign   Cocaine use disorder, severe, dependence (HCC)   Schizophrenia, paranoid type (HCC)   Homelessness   Elevated troponin   HLD (hyperlipidemia)   CHF exacerbation (HCC)   LOS: 1 day   A & P   Acute exacerbation of systolic and diastolic CHF: Recurrent due to noncompliance. Continue diuresis.  3rd degree heart block with bradycardia: Cardiology has been consulted.Dr. Ladona Ridgelaylor wants to approach this with watchful waiting. Due to the patient's multiple comorbidities, social situation and illicit drug use he is a poor candidate for implantation of a device.  Diabetes II: Sliding scale insulin with home regimen of glipizide. Patient has not been taking glipizide apparently.  Hypertension: Continue all home medications and monitor blood pressure.  Polysubstance abuse: Including cocaine tobacco and THC. Once again counseled patient.  Schizophrenia: Continue his home psychiatric medications.  Chronically elevated troponins: Unchanged from previous numbers. Monitor on telemetry.  Hyperlipidemia: Not on statin. Patient has not been compliant in the past.  Penile lesion: Resume Keflex.  I have seen and examined this patient myself. I have spent 30 minutes in his evaluation and care.  DVT prophylaxis: Lovenox Code Status: Full code Family Communication: Patient has no family to discuss with.  Discussed care with him directly. Disposition Plan: Social work involvement to be determined   Taylor Woodin, DO [facility]: Direct contact: see [email or web address]  7PM-7AM contact night coverage as above 12/20/2018, 3:12 PM  LOS: 0 days

## 2018-12-21 ENCOUNTER — Encounter (INDEPENDENT_AMBULATORY_CARE_PROVIDER_SITE_OTHER): Payer: Medicaid Other | Admitting: Primary Care

## 2018-12-21 DIAGNOSIS — R7989 Other specified abnormal findings of blood chemistry: Secondary | ICD-10-CM

## 2018-12-21 DIAGNOSIS — Z590 Homelessness: Secondary | ICD-10-CM

## 2018-12-21 DIAGNOSIS — F1420 Cocaine dependence, uncomplicated: Secondary | ICD-10-CM

## 2018-12-21 DIAGNOSIS — I248 Other forms of acute ischemic heart disease: Secondary | ICD-10-CM

## 2018-12-21 DIAGNOSIS — I5043 Acute on chronic combined systolic (congestive) and diastolic (congestive) heart failure: Secondary | ICD-10-CM

## 2018-12-21 DIAGNOSIS — I10 Essential (primary) hypertension: Secondary | ICD-10-CM

## 2018-12-21 LAB — GLUCOSE, CAPILLARY
Glucose-Capillary: 187 mg/dL — ABNORMAL HIGH (ref 70–99)
Glucose-Capillary: 246 mg/dL — ABNORMAL HIGH (ref 70–99)

## 2018-12-21 LAB — BASIC METABOLIC PANEL
Anion gap: 8 (ref 5–15)
BUN: 21 mg/dL — ABNORMAL HIGH (ref 6–20)
CO2: 28 mmol/L (ref 22–32)
Calcium: 8.9 mg/dL (ref 8.9–10.3)
Chloride: 102 mmol/L (ref 98–111)
Creatinine, Ser: 0.9 mg/dL (ref 0.61–1.24)
GFR calc Af Amer: >60 mL/min (ref >60)
GFR calc non Af Amer: >60 mL/min (ref >60)
Glucose, Bld: 229 mg/dL — ABNORMAL HIGH (ref 70–99)
Potassium: 4.2 mmol/L (ref 3.5–5.1)
Sodium: 138 mmol/L (ref 135–145)

## 2018-12-21 MED ORDER — INSULIN GLARGINE 100 UNIT/ML ~~LOC~~ SOLN
10.0000 [IU] | Freq: Every day | SUBCUTANEOUS | Status: DC
  Administered 2018-12-21: 13:00:00 10 [IU] via SUBCUTANEOUS
  Filled 2018-12-21: qty 0.1

## 2018-12-21 MED ORDER — LISINOPRIL 20 MG PO TABS: 20.0000 mg | ORAL_TABLET | Freq: Every day | ORAL | Status: DC

## 2018-12-21 NOTE — Progress Notes (Signed)
Inpatient Diabetes Program Recommendations  AACE/ADA: New Consensus Statement on Inpatient Glycemic Control (2015)  Target Ranges:  Prepandial:   less than 140 mg/dL      Peak postprandial:   less than 180 mg/dL (1-2 hours)      Critically ill patients:  140 - 180 mg/dL   Lab Results  Component Value Date   GLUCAP 187 (H) 12/21/2018   HGBA1C 9.7 (H) 12/04/2018    Review of Glycemic Control Results for [name], [name] (MRN [id number]) as of 12/21/2018 10:19  Ref. Range 12/20/2018 16:26 12/20/2018 20:35 12/21/2018 07:57  Glucose-Capillary Latest Ref Range: 70 - 99 mg/dL 264 (H) 214 (H) 187 (H)   Diabetes history: Type 2 Outpatient Diabetes medications: Glipizide 2.5 mg BID (not taking) Current orders for Inpatient glycemic control: Novolog SENSITIVE correction scale TID & HS  Inpatient Diabetes Program Recommendations:     If blood sugars continue to be greater than 180 mg/dl, recommend adding Lantus 12 units daily while in the hospital.  Thanks, Bronson Curb, MSN, RNC-OB Diabetes Coordinator [phone] (8a-5p)

## 2018-12-21 NOTE — Progress Notes (Signed)
PROGRESS NOTE    [name]  ZCH:[id number] DOB: 1960/07/20 DOA: 12/18/2018 PCP: Patient, No Pcp Per   Brief Narrative:  [name] a 58 y.o.malewith medical history significant ofend-stage congestive heart failure with combined systolic and diastolic EF of 25 to 28%, type 2 diabetes, hypertension, schizophrenia, polysubstance abuse, history of HCV,obstructive sleep apnea, morbid obesity, aggressive behavior who was recently admitted 3 days ago and left the hospital the next day. It appears he has been leaving [place] but patient said he was discharged. He has been admitted at least 3 times already this month with the same problem. Patient is homeless. He has generalized anasarca at the moment. He has not been compliant with his medications. Also diet. Patient is very anxious at the moment and not giving adequate history. He however is complaining of inability to breathe well and has generalized swelling consistent with anasarca. Patient has nowhere to go and apparently when he left the hospital the last time he was not set up for rehab. He thinkshe should have gone to skilled facility..  ED Course:His temperature is 97.5 blood pressure 163/99 pulse 94 respiratory of 18 oxygen sat 85% on room air. Sodium is 139 potassium 4.2 chloride 102 CO2 27 glucose 258 BUN 22 creatinine 1.07. Magnesium is 2.1. BNP 974 troponin 0 0.03. White count is 8.7 hemoglobin 8.7 platelets 227. Urinalysis so far negative. COVID-19 negative. Urine drug screen is positive for cocaine and tetrahydrocannabinol. Chest x-ray shows stable interstitial pulmonary edema and small bilateral pleural effusions. Patient given IV Lasix and being admitted to the hospital.  The patient was admitted to a telemetry bed. He is being diuresed.  Patient developed transient third-degree heart block so cardiology was consulted however they have decided not to implant cardiac pacemaker  due to his psychiatric disorder, homelessness and noncompliance issues.  Consultants:   Cardiology  Procedures:   None  Antimicrobials:   Keflex   Subjective: Patient seen and examined.  The only complaint he has is a scrotal swelling and some shortness of breath which is getting better.  No new complaint.  He wants to talk to Education officer, [employer] for his homelessness issues.  Objective: Vitals:   12/21/18 0234 12/21/18 0555 12/21/18 0743 12/21/18 1010  BP:  (!) 163/92  (!) 148/90  Pulse:  (!) 103 100 (!) 104  Resp:   (!) 21 (!) 22  Temp:  (!) 97.5 F (36.4 C)  98.5 F (36.9 C)  TempSrc:  Oral  Oral  SpO2: 93% 90% 93% 94%  Weight:  111.5 kg    Height:        Intake/Output Summary (Last 24 hours) at 12/21/2018 1121 Last data filed at 12/21/2018 1000 Gross per 24 hour  Intake 2462 ml  Output 4850 ml  Net -2388 ml   Filed Weights   12/19/18 0259 12/20/18 0500 12/21/18 0555  Weight: 114.2 kg 108.5 kg 111.5 kg    Examination:  General exam: Appears calm and comfortable  Respiratory system: Clear to auscultation. Respiratory effort normal. Cardiovascular system: S1 & S2 heard, RRR. No JVD, murmurs, rubs, gallops or clicks.  +1 pitting edema Gastrointestinal system: Abdomen is nondistended, soft and nontender. No organomegaly or masses felt. Normal bowel sounds heard.  Scrotal edema Central nervous system: Alert and oriented. No focal neurological deficits. Extremities: Symmetric 5 x 5 power. Skin: No rashes, lesions or ulcers Psychiatry: Judgement and insight appear poor. Mood & affect appropriate.    Data Reviewed: I have personally reviewed  following labs and imaging studies  CBC: Recent Labs  Lab 12/16/18 0415 12/17/18 0404 12/18/18 2332 12/20/18 1022  WBC 12.0* 7.5 8.7 6.7  NEUTROABS 10.5* 5.9  --  4.9  HGB 9.0* 8.4* 8.7* 8.3*  HCT 31.6* 29.3* 29.6* 28.9*  MCV 81.4 80.7 79.1* 80.5  PLT 213 176 227 203   Basic Metabolic Panel: Recent Labs  Lab 12/18/18  0625 12/18/18 2332 12/19/18 1115 12/20/18 1022 12/21/18 0526  NA 139 139 141 139 138  K 4.3 4.2 4.1 3.9 4.2  CL 100 102 101 99 102  CO2 30 27 30 30 28   GLUCOSE 231* 258* 256* 264* 229*  BUN 25* 22* 21* 21* 21*  CREATININE 0.90 1.07 0.98 0.94 0.90  CALCIUM 8.7* 8.7* 9.0 8.7* 8.9  MG  --  2.1  --   --   --    GFR: Estimated Creatinine Clearance: 111.4 mL/min (by C-G formula based on SCr of 0.9 mg/dL). Liver Function Tests: Recent Labs  Lab 12/19/18 1115  AST 26  ALT 18  ALKPHOS 198*  BILITOT 0.8  PROT 7.5  ALBUMIN 2.9*   No results for input(s): LIPASE, AMYLASE in the last 168 hours. No results for input(s): AMMONIA in the last 168 hours. Coagulation Profile: No results for input(s): INR, PROTIME in the last 168 hours. Cardiac Enzymes: Recent Labs  Lab 12/16/18 0415 12/16/18 0831 12/16/18 1645 12/16/18 2118 12/18/18 2332  TROPONINI 0.03* 0.04* 0.03* 0.03* 0.03*   BNP (last 3 results) No results for input(s): PROBNP in the last 8760 hours. HbA1C: No results for input(s): HGBA1C in the last 72 hours. CBG: Recent Labs  Lab 12/20/18 0730 12/20/18 1135 12/20/18 1626 12/20/18 2035 12/21/18 0757  GLUCAP 203* 207* 264* 214* 187*   Lipid Profile: No results for input(s): CHOL, HDL, LDLCALC, TRIG, CHOLHDL, LDLDIRECT in the last 72 hours. Thyroid Function Tests: No results for input(s): TSH, T4TOTAL, FREET4, T3FREE, THYROIDAB in the last 72 hours. Anemia Panel: No results for input(s): VITAMINB12, FOLATE, FERRITIN, TIBC, IRON, RETICCTPCT in the last 72 hours. Sepsis Labs: No results for input(s): PROCALCITON, LATICACIDVEN in the last 168 hours.  Recent Results (from the past 240 hour(s))  SARS Coronavirus 2     Status: None   Collection Time: 12/16/18  4:25 AM  Result Value Ref Range Status   SARS Coronavirus 2 NOT DETECTED NOT DETECTED Final    Comment: (NOTE) SARS-CoV-2 target nucleic acids are NOT DETECTED. The SARS-CoV-2 RNA is generally detectable in  upper and lower respiratory specimens during the acute phase of infection.  Negative  results do not preclude SARS-CoV-2 infection, do not rule out co-infections with other pathogens, and should not be used as the sole basis for treatment or other patient management decisions.  Negative results must be combined with clinical observations, patient history, and epidemiological information. The expected result is Not Detected. Fact Sheet for Patients: http://www.biofiredefense.com/wp-content/uploads/2020/03/BIOFIRE-COVID -19-patients.pdf Fact Sheet for Healthcare Providers: http://www.biofiredefense.com/wp-content/uploads/2020/03/BIOFIRE-COVID -19-hcp.pdf This test is not yet approved or cleared by the [place] FDA and  has been authorized for detection and/or diagnosis of SARS-CoV-2 by FDA under an Emergency Use Authorization (EUA).  This EUA will remain in effec t (meaning this test can be used) for the duration of  the COVID-19 declaration under Section 564(b)(1) of the Act, 21 U.S.C. section 360bbb-3(b)(1), unless the authorization is terminated or revoked sooner. Performed at [facility], [address] [place], [place] [address]   SARS Coronavirus 2 (CEPHEID- Performed in [facility]  hospital lab), Hosp Order     Status: None   Collection Time: 12/19/18  1:37 AM   Specimen: Nasopharyngeal Swab  Result Value Ref Range Status   SARS Coronavirus 2 NEGATIVE NEGATIVE Final    Comment: (NOTE) If result is NEGATIVE SARS-CoV-2 target nucleic acids are NOT DETECTED. The SARS-CoV-2 RNA is generally detectable in upper and lower  respiratory specimens during the acute phase of infection. The lowest  concentration of SARS-CoV-2 viral copies this assay can detect is 250  copies / mL. A negative result does not preclude SARS-CoV-2 infection  and should not be used as the sole basis for treatment or other  patient management decisions.  A negative result may occur with   improper specimen collection / handling, submission of specimen other  than nasopharyngeal swab, presence of viral mutation(s) within the  areas targeted by this assay, and inadequate number of viral copies  (<250 copies / mL). A negative result must be combined with clinical  observations, patient history, and epidemiological information. If result is POSITIVE SARS-CoV-2 target nucleic acids are DETECTED. The SARS-CoV-2 RNA is generally detectable in upper and lower  respiratory specimens dur ing the acute phase of infection.  Positive  results are indicative of active infection with SARS-CoV-2.  Clinical  correlation with patient history and other diagnostic information is  necessary to determine patient infection status.  Positive results do  not rule out bacterial infection or co-infection with other viruses. If result is PRESUMPTIVE POSTIVE SARS-CoV-2 nucleic acids MAY BE PRESENT.   A presumptive positive result was obtained on the submitted specimen  and confirmed on repeat testing.  While 2019 novel coronavirus  (SARS-CoV-2) nucleic acids may be present in the submitted sample  additional confirmatory testing may be necessary for epidemiological  and / or clinical management purposes  to differentiate between  SARS-CoV-2 and other Sarbecovirus currently known to infect humans.  If clinically indicated additional testing with an alternate test  methodology [id number]) is advised. The SARS-CoV-2 RNA is generally  detectable in upper and lower respiratory sp ecimens during the acute  phase of infection. The expected result is Negative. Fact Sheet for Patients:  BoilerBrush.com.cyhttps://www.fda.gov/media/136312/download Fact Sheet for Healthcare Providers: https://pope.com/https://www.fda.gov/media/136313/download This test is not yet approved or cleared by the [place] FDA and has been authorized for detection and/or diagnosis of SARS-CoV-2 by FDA under an Emergency Use Authorization (EUA).  This EUA will  remain in effect (meaning this test can be used) for the duration of the COVID-19 declaration under Section 564(b)(1) of the Act, 21 U.S.C. section 360bbb-3(b)(1), unless the authorization is terminated or revoked sooner. Performed at [facility], [address] [place], [place] [address]       Radiology Studies: No results found.  Scheduled Meds: . ARIPiprazole  10 mg Oral Daily  . carbamazepine  100 mg Oral BID  . cephALEXin  500 mg Oral Q8H  . enoxaparin (LOVENOX) injection  40 mg Subcutaneous Q24H  . furosemide  60 mg Intravenous Q12H  . insulin aspart  0-5 Units Subcutaneous QHS  . insulin aspart  0-9 Units Subcutaneous TID WC  . insulin glargine  10 Units Subcutaneous Daily  . ipratropium-albuterol  3 mL Inhalation Q6H WA  . [START ON 12/22/2018] lisinopril  20 mg Oral Daily  . nicotine  21 mg Transdermal Daily  . potassium chloride SA  20 mEq Oral BID  . sodium chloride flush  3 mL Intravenous Q12H   Continuous Infusions: . sodium chloride  LOS: 2 days   Assessment & Plan:   Principal Problem:   Acute on chronic combined systolic and diastolic CHF (congestive heart failure) (HCC) Active Problems:   Insulin-requiring or dependent type II diabetes mellitus (HCC)   Essential hypertension, benign   Cocaine use disorder, severe, dependence (HCC)   Schizophrenia, paranoid type (HCC)   Homelessness   Elevated troponin   HLD (hyperlipidemia)   CHF exacerbation (HCC)  Acute exacerbation of systolic and diastolic congestive heart failure: Has had significant diuresis and weight loss.  Continue Lasix 60 mg IV twice daily.  Cardiology on board.  We will further defer to them.  Third-degree heart block with bradycardia: Cardiology on board.  Per them this was transient and since then he has remained in sinus rhythm.  They have deferred on any cardiac pacemaker implantation at this point in time due to patient's noncompliance, homelessness and psychiatric  issues.  Hypertension: Continue all medications however due to slightly elevated blood pressure, I will increase his lisinopril to 20 mg.  Polysubstance abuse: Cocaine and tobacco as well as THC.  Counseled.  Schizophrenia: Home medications.  Chronically elevated troponin: Nonsignificant.  No ACS symptoms.  Penile lesion: Continue Keflex.  Hyperlipidemia: Not on any statin.  Due to noncompliance.  Type 2 diabetes mellitus: Blood elevated.  On oral hypoglycemics however noncompliant with that.  We will start him on Lantus 10 units starting now and continue sliding scale screen.  DVT prophylaxis: Lovenox Code Status: Full code Family Communication: He has no family. Disposition Plan: Social work consulted.   Time spent: 30 minutes   Hughie Clossavi Arian Murley, MD [facility] Pager [phone]  If 7PM-7AM, please contact night-coverage [email or web address] Password [email or web address] 12/21/2018, 11:21 AM

## 2018-12-21 NOTE — Progress Notes (Signed)
Physical Therapy Evaluation   Clinical Impression: Pt known to PT from prior admissions. Pt has RW from last admission in his room and reports that he has been using it the last couple of days to get around. Pt reports wanting to be admitted to SNF. Explained to pt that it is very difficult to find him a place to stay as he keeps leaving the hospital AMA. Pt continues to be limited in safe mobility by scrotal pain, as well decreased strength and endurance. Pt is min guard for transfers and ambulation of 25 feet with RW on 4L O2. Ideally pt would have SNF level rehab, however at time of this writing pt has already left AMA.     12/21/18 1600  PT Visit Information  Last PT Received On 12/21/18  Assistance Needed +1  History of Present Illness 58 y.o. male with medical history significant of chronic combined CHF; OSA; schizophrenia; polysubstance abuse; HTN; homelessness; hep C; and DM presenting with worsening edema.  He is frequently seen in the ER (55 times in last 6 months) and admitted 14 times for CHF. Last hospitalization 5/9-11, left AMA. Admitted 5/19 for CHF.    Precautions  Precautions Fall  Precaution Comments per chart, pt states he gets angry and goes AMA all the time.  Restrictions  Weight Bearing Restrictions No  Home Living  Family/patient expects to be discharged to: Unsure  Living Arrangements Alone  Prior Function  Level of Independence Independent with assistive device(s)  Comments uses RW  Communication  Communication No difficulties  Pain Assessment  Pain Assessment 0-10  Pain Score 7  Pain Location scrotum and LE  Pain Descriptors / Indicators Pressure;Tightness;Burning  Pain Intervention(s) Limited activity within patient's tolerance;Monitored during session;Repositioned  Cognition  Arousal/Alertness Awake/alert  Behavior During Therapy Restless;WFL for tasks assessed/performed  Overall Cognitive Status History of cognitive impairments - at baseline  Area of  Impairment Memory;Problem solving;Safety/judgement;Attention  Orientation Level Time  Current Attention Level Selective  Following Commands Follows one step commands consistently;Follows one step commands with increased time;Follows multi-step commands with increased time;Follows multi-step commands consistently  Safety/Judgement Decreased awareness of safety;Decreased awareness of deficits  Awareness Emergent  Problem Solving Slow processing;Decreased initiation;Difficulty sequencing;Requires verbal cues;Requires tactile cues  Upper Extremity Assessment  Upper Extremity Assessment Generalized weakness  Lower Extremity Assessment  RLE Deficits / Details noted swelling, pt reports no change  RLE Coordination decreased fine motor  LLE Deficits / Details noted swelling, pt reports no change  LLE Coordination decreased fine motor  Bed Mobility  General bed mobility comments up in recliner on entry  Transfers  Overall transfer level Needs assistance  Equipment used Rolling walker (2 wheeled)  Transfers Sit to/from Stand  Sit to Stand Min guard  General transfer comment cues for hand placement and safety  Ambulation/Gait  Ambulation/Gait assistance Min guard  Gait Distance (Feet) 25 Feet  Assistive device Rolling walker (2 wheeled)  Gait Pattern/deviations Step-through pattern;Wide base of support;Trunk flexed;Shuffle  General Gait Details wide BOS with short, waddling steps due to perineal/scotal edema.  Gait velocity slower  Gait velocity interpretation <1.8 ft/sec, indicate of risk for recurrent falls  Balance  Overall balance assessment Needs assistance  Sitting-balance support Feet supported  Sitting balance-Leahy Scale Good  Standing balance support Bilateral upper extremity supported  Standing balance-Leahy Scale Poor  Standing balance comment reliant on the RW for dynamic mobility  General Comments  General comments (skin integrity, edema, etc.) LE edema, and distended abdomen   PT -  End of Session  Equipment Utilized During Treatment Gait belt;Oxygen  Activity Tolerance Patient tolerated treatment well  Patient left in chair;with call bell/phone within reach;with chair alarm set  Nurse Communication Mobility status  PT Assessment  PT Recommendation/Assessment Patient needs continued PT services  PT Visit Diagnosis Unsteadiness on feet (R26.81);Other abnormalities of gait and mobility (R26.89);Difficulty in walking, not elsewhere classified (R26.2);Pain  Pain - Right/Left  (bilateral)  Pain - part of body Leg  PT Problem List Decreased strength;Decreased range of motion;Decreased coordination;Decreased cognition;Decreased balance;Decreased mobility;Decreased activity tolerance;Decreased knowledge of use of DME;Decreased safety awareness;Cardiopulmonary status limiting activity;Obesity  Barriers to Discharge Decreased caregiver support  PT Plan  PT Frequency (ACUTE ONLY) Min 3X/week  PT Treatment/Interventions (ACUTE ONLY) DME instruction;Gait training;Stair training;Functional mobility training;Balance training;Patient/family education  AM-PAC PT "6 Clicks" Mobility Outcome Measure (Version 2)  Help needed turning from your back to your side while in a flat bed without using bedrails? 3  Help needed moving from lying on your back to sitting on the side of a flat bed without using bedrails? 4  Help needed moving to and from a bed to a chair (including a wheelchair)? 4  Help needed standing up from a chair using your arms (e.g., wheelchair or bedside chair)? 4  Help needed to walk in hospital room? 3  Help needed climbing 3-5 steps with a railing?  2  6 Click Score 20  Consider Recommendation of Discharge To: Home with no services  PT Recommendation  Follow Up Recommendations SNF  PT equipment Rolling walker with 5" wheels  Individuals Consulted  Consulted and Agree with Results and Recommendations Patient  Acute Rehab PT Goals  Patient Stated Goal Get a place  to stay.  PT Goal Formulation With patient  Time For Goal Achievement 01/04/19  Potential to Achieve Goals Fair  PT Time Calculation  PT Start Time (ACUTE ONLY) 0958  PT Stop Time (ACUTE ONLY) 1033  PT Time Calculation (min) (ACUTE ONLY) 35 min  PT General Charges  $$ ACUTE PT VISIT 1 Visit  PT Evaluation  $PT Eval Moderate Complexity 1 Mod  PT Treatments  $Gait Training 8-22 mins  Written Expression  Dominant Hand Right   Cerria Randhawa B. Beverely RisenVan Fleet PT, DPT Acute Rehabilitation Services Pager [phone] Office [phone]

## 2018-12-21 NOTE — Progress Notes (Signed)
Patient stated he was ready to leave. States he wants to sign AMA paperwork. Patient removed all monitors at this time. AMA paper signed by patient. Dr. Doristine Bosworth notified.

## 2018-12-21 NOTE — Plan of Care (Signed)
  Problem: Pain Managment: Goal: General experience of comfort will improve Outcome: Progressing Note: Pain relieved with tylenol.
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
## 2018-12-21 NOTE — Progress Notes (Addendum)
Progress Note  Patient Name: Mellody MemosFrederick L Shellenbarger Date of Encounter: 12/21/2018  Primary Cardiologist: Kristeen MissPhilip Nahser, MD   Subjective   No significant overnight events. Patient did not sleep well last night and is very tired this morning. He had trouble staying awake to answer questions but denied any chest pain or shortness of breath. Spoke with RN who states he has not complained of any palpitations, lightheadedness, or dizziness. RN states brief episodes of heart block seem to occur during apneic episodes.  Inpatient Medications    Scheduled Meds: . ARIPiprazole  10 mg Oral Daily  . carbamazepine  100 mg Oral BID  . cephALEXin  500 mg Oral Q8H  . enoxaparin (LOVENOX) injection  40 mg Subcutaneous Q24H  . furosemide  60 mg Intravenous Q12H  . insulin aspart  0-5 Units Subcutaneous QHS  . insulin aspart  0-9 Units Subcutaneous TID WC  . ipratropium-albuterol  3 mL Inhalation Q6H WA  . lisinopril  10 mg Oral Daily  . nicotine  21 mg Transdermal Daily  . potassium chloride SA  20 mEq Oral BID  . sodium chloride flush  3 mL Intravenous Q12H   Continuous Infusions: . sodium chloride     PRN Meds: sodium chloride, acetaminophen, alum & mag hydroxide-simeth, ondansetron (ZOFRAN) IV, sodium chloride flush   Vital Signs    Vitals:   12/20/18 1949 12/20/18 2030 12/21/18 0234 12/21/18 0555  BP:  (!) 155/87  (!) 163/92  Pulse:  97  (!) 103  Resp:  (!) 21    Temp:  97.9 F (36.6 C)  (!) 97.5 F (36.4 C)  TempSrc:  Oral  Oral  SpO2: 93% 95% 93% 90%  Weight:    111.5 kg  Height:        Intake/Output Summary (Last 24 hours) at 12/21/2018 0744 Last data filed at 12/21/2018 0700 Gross per 24 hour  Intake 3302 ml  Output 7650 ml  Net -4348 ml   Filed Weights   12/19/18 0259 12/20/18 0500 12/21/18 0555  Weight: 114.2 kg 108.5 kg 111.5 kg    Telemetry    Sinus rhythm with rates in the 90's to 120's with a few brief episodes of high grade AV block with rates in the high  30's.  - Personally Reviewed  ECG    No new ECG tracing today. - Personally Reviewed  Physical Exam   GEN: Obese African-American male resting comfortably. Very tired but able to be aroused and in no acute distress.  Neck: Supple.  Cardiac: Borderline tachycardic with regular rhythm. No murmurs, gallops, or rubs.  Respiratory: No increased work of breathing. Clear to auscultation anteriorly. No significant wheezes, rhonchi, or rales. GI: Abdomen soft, non-distended, and non-tender.  Extremities: 2+ pitting edema of bilateral lower extremities up to knees. Radial and distal pedal pulses 2+ and equal bilaterally. Skin: Warm and dry. Neuro:  No focal deficits. Psych: Normal affect. Responds appropriately.  Labs    Chemistry Recent Labs  Lab 12/19/18 1115 12/20/18 1022 12/21/18 0526  NA 141 139 138  K 4.1 3.9 4.2  CL 101 99 102  CO2 30 30 28   GLUCOSE 256* 264* 229*  BUN 21* 21* 21*  CREATININE 0.98 0.94 0.90  CALCIUM 9.0 8.7* 8.9  PROT 7.5  --   --   ALBUMIN 2.9*  --   --   AST 26  --   --   ALT 18  --   --   ALKPHOS 198*  --   --  BILITOT 0.8  --   --   GFRNONAA >60 >60 >60  GFRAA >60 >60 >60  ANIONGAP 10 10 8      Hematology Recent Labs  Lab 12/17/18 0404 12/18/18 2332 12/20/18 1022  WBC 7.5 8.7 6.7  RBC 3.63* 3.74* 3.59*  HGB 8.4* 8.7* 8.3*  HCT 29.3* 29.6* 28.9*  MCV 80.7 79.1* 80.5  MCH 23.1* 23.3* 23.1*  MCHC 28.7* 29.4* 28.7*  RDW 21.1* 21.2* 21.1*  PLT 176 227 203    Cardiac Enzymes Recent Labs  Lab 12/16/18 0831 12/16/18 1645 12/16/18 2118 12/18/18 2332  TROPONINI 0.04* 0.03* 0.03* 0.03*   No results for input(s): TROPIPOC in the last 168 hours.   BNP Recent Labs  Lab 12/16/18 0415 12/18/18 2350  BNP 1,591.9* 974.0*     DDimer No results for input(s): DDIMER in the last 168 hours.   Radiology    No results found.  Cardiac Studies   Echocardiogram 11/07/2018: Impressions:  1. The left ventricle has severely reduced systolic  function, with an ejection fraction of 25-30%. The cavity size was mildly dilated. There is moderate concentric left ventricular hypertrophy. Left ventricular diastolic Doppler parameters are  consistent with restrictive filling. Elevated left atrial and left ventricular end-diastolic pressures There is right ventricular volume and pressure overload. Left ventricular diffuse hypokinesis.  2. The right ventricle has severely reduced systolic function. The cavity was moderately enlarged. There is no increase in right ventricular wall thickness. Right ventricular systolic pressure is moderately elevated with an estimated pressure of 51.2  mmHg.  3. Left atrial size was moderately dilated.  4. Right atrial size was severely dilated.  5. Small pericardial effusion.  6. The pericardial effusion is posterior to the left ventricle.  7. Mild thickening of the mitral valve leaflet. No evidence of mitral valve stenosis.  8. The tricuspid valve is grossly normal. Tricuspid valve regurgitation is moderate.  9. The aortic valve is tricuspid. Mild thickening of the aortic valve. Mild calcification of the aortic valve. Aortic valve regurgitation is mild by color flow Doppler. No stenosis of the aortic valve. 10. The inferior vena cava was dilated in size with <50% respiratory variability.  Summary: There is no significant difference since the prior study on 09/01/2017, there is severe biventricular failure, LVEDP is severely elevated.  Patient Profile   Mr. Marland is a 58 y.o. 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 He has had multiple hospital admission for this over the last month. Cardiology was consulted yesterday for evaluation of  transient high grade heart block at the request of Dr. Benny Lennert.   Assessment & Plan   Transient Asymptomatic High-Grade Heart Block - Telemetry shows baseline sinus rhythm with rates in the 90's to 120's with a few brief episodes of high grade AV block with rates in the high 30's. Spoke with RN and episodes of heart block seem to coincide with apneic episodes. - No evidence of conduction system disease - QRS narrow and short PR interval.  - Per Dr. Lovena Le pacing is a poor option for this patient given his multiple comorbidities, homelessness and psychological difficulties. Will continue watchful waiting and avoid beta-blockers at this time.  Acute on Chronic Systolic and Diastolic CHF - Patient has had multiple hospitalization over the last  month for CHF and presented again on 12/18/2018 for shortness of breath. - Chest x-ray on 6/20 showed stable cardiomegaly with stable interstitial pulmonary edema and small bilateral pleural effusions. - BNP 974.0, down from 1,591.9 on 6/17. - Patient currently on IV Lasix 60mg  twice daily. Documented output of 7.6 L in the last 24 hours (unsure if this is accurate) and net negative 6 L since admission. Weight 245.9 lbs today down from 255.9 on admission. Renal function stable. - Continue diuresis with IV Lasix. May consider decrease to 40mg  twice daily if output is accurate. - Continue Lisinopril 10mg  daily. - No beta-blocker due to transient high-grade heart block. - Continue to monitor daily weight, strict I/O's, and renal function.  Elevated Troponin - Troponin borderline positive in the ED at 0.03. Per chart review, troponin often elevated at 0.03 to 0.04 during recent hospitalization. No consistent with ACS. Likely demand ischemia in the setting of acute CHF. EKG does show T wave inversions in leads V4 through V6 (new in lead V4). Patient denies any chest pain. Do not suspect any additional ischemic evaluation at this time.   Hypertension - BP elevated  at 163/92 this morning. - Recommend increasing Lisinopril to 20mg  daily.  Type 2 Diabetes Mellitus - Management per primary team.  Otherwise, per primary team. - Obstructive sleep apnea - Schizophrenia - Polysubstance abuse, UDS positive for cocaine and THC. - Hepatitis C - Homelessness    For questions or updates, please contact [facility] Please consult [email or web address] for contact info under Cardiology/STEMI.      Signed, Corrin Parkerallie E Goodrich, PA-C  12/21/2018, 7:44 AM   Pager: [phone]  I have examined the patient and reviewed assessment and plan and discussed with patient.  Agree with above as stated.   Avoiding beta blockers.  Now with sinus tach.  Narrow QRS.    Biggest issue for him is placement.  He needs to be compliant with heart failure management. He needs a place to go.  Case mangement consult needed.  I agree he is not a candidate for pacing or for other ischemia testing at this time given the noncompliance and drug use.    Lance MussJayadeep [place] Devine

## 2018-12-21 NOTE — Progress Notes (Addendum)
   12/21/18 1010  What Happened  Was fall witnessed? Yes  Who witnessed fall? Kerrie Buffalo RN  Patients activity before fall to/from bed, chair, or stretcher  Point of contact buttocks  Was patient injured? No  Follow Up  MD notified Pahwani  Time MD notified 1010  Family notified No- patient refusal  Simple treatment Other (comment) (none needed; pt uninjured)  Progress note created (see row info) Yes  Adult Fall Risk Assessment  Risk Factor Category (scoring not indicated) Fall has occurred during this admission (document High fall risk)  Patient Fall Risk Level High fall risk  Adult Fall Risk Interventions  Required Bundle Interventions *See Row Information* High fall risk - low, moderate, and high requirements implemented  Additional Interventions PT/OT need assessed if change in mobility from baseline;Room near nurses station  Screening for Fall Injury Risk (To be completed on HIGH fall risk patients) - Assessing Need for Low Bed  Risk For Fall Injury- Low Bed Criteria Previous fall this admission  Will Implement Low Bed and Floor Mats No - Criteria no longer met for low bed (low bed/floor mats would pose more risk to patient.)  Screening for Fall Injury Risk (To be completed on HIGH fall risk patients who do not meet crieteria for Low Bed) - Assessing Need for Floor Mats Only  Risk For Fall Injury- Criteria for Floor Mats None identified - No additional interventions needed  Vitals  Temp 98.5 F (36.9 C)  Temp Source Oral  BP (!) 148/90  MAP (mmHg) 105  BP Location Left Arm  BP Method Automatic  Patient Position (if appropriate) Lying  Pulse Rate (!) 104  ECG Heart Rate (!) 103  Resp (!) 22  Oxygen Therapy  SpO2 94 %  O2 Device Nasal Cannula  O2 Flow Rate (L/min) 4 L/min  Pain Assessment  Pain Scale 0-10  Pain Score 0  Patients Stated Pain Goal 0  PAINAD (Pain Assessment in Advanced Dementia)  Breathing 0  PCA/Epidural/Spinal Assessment  Respiratory  Pattern Regular;Unlabored  Neurological  Neuro (WDL) WDL  Level of Consciousness Alert  Orientation Level Oriented X4  Glasgow Coma Scale  Eye Opening 4  Best Verbal Response (NON-intubated) 5  Best Motor Response 6  Musculoskeletal  Musculoskeletal (WDL) X  Assistive Device Standard walker  Generalized Weakness Yes  Weight Bearing Restrictions No   Fall was related to unlocked wheels on patient recliner and was witnessed by Therapist, sports. After discussing with charge RN, it was decided that implementing a low bed/floor mats at this time would pose more hazard to patient. Although patient experiences frequent urgency due to IV lasix, he has been compliant with calling staff for assistance prior to ambulating/toileting.

## 2018-12-22 ENCOUNTER — Other Ambulatory Visit: Payer: Self-pay

## 2018-12-22 ENCOUNTER — Inpatient Hospital Stay (HOSPITAL_COMMUNITY)
Admission: EM | Admit: 2018-12-22 | Discharge: 2018-12-25 | DRG: 291 | Discharge status: Against Medical Advice | Payer: Medicare Other | Attending: Internal Medicine | Admitting: Internal Medicine

## 2018-12-22 ENCOUNTER — Encounter (HOSPITAL_COMMUNITY): Payer: Self-pay | Admitting: Emergency Medicine

## 2018-12-22 ENCOUNTER — Emergency Department (HOSPITAL_COMMUNITY): Payer: Medicare Other

## 2018-12-22 DIAGNOSIS — N492 Inflammatory disorders of scrotum: Secondary | ICD-10-CM | POA: Diagnosis present

## 2018-12-22 DIAGNOSIS — J9621 Acute and chronic respiratory failure with hypoxia: Secondary | ICD-10-CM | POA: Diagnosis present

## 2018-12-22 DIAGNOSIS — J9622 Acute and chronic respiratory failure with hypercapnia: Secondary | ICD-10-CM | POA: Diagnosis present

## 2018-12-22 DIAGNOSIS — R0902 Hypoxemia: Secondary | ICD-10-CM

## 2018-12-22 DIAGNOSIS — D509 Iron deficiency anemia, unspecified: Secondary | ICD-10-CM | POA: Diagnosis present

## 2018-12-22 DIAGNOSIS — E1165 Type 2 diabetes mellitus with hyperglycemia: Secondary | ICD-10-CM | POA: Diagnosis present

## 2018-12-22 DIAGNOSIS — I110 Hypertensive heart disease with heart failure: Secondary | ICD-10-CM | POA: Diagnosis present

## 2018-12-22 DIAGNOSIS — Z9119 Patient's noncompliance with other medical treatment and regimen: Secondary | ICD-10-CM | POA: Diagnosis not present

## 2018-12-22 DIAGNOSIS — E119 Type 2 diabetes mellitus without complications: Secondary | ICD-10-CM | POA: Diagnosis not present

## 2018-12-22 DIAGNOSIS — F1420 Cocaine dependence, uncomplicated: Secondary | ICD-10-CM | POA: Diagnosis present

## 2018-12-22 DIAGNOSIS — Z8249 Family history of ischemic heart disease and other diseases of the circulatory system: Secondary | ICD-10-CM

## 2018-12-22 DIAGNOSIS — F17210 Nicotine dependence, cigarettes, uncomplicated: Secondary | ICD-10-CM | POA: Diagnosis present

## 2018-12-22 DIAGNOSIS — Z9114 Patient's other noncompliance with medication regimen: Secondary | ICD-10-CM

## 2018-12-22 DIAGNOSIS — Z7951 Long term (current) use of inhaled steroids: Secondary | ICD-10-CM | POA: Diagnosis not present

## 2018-12-22 DIAGNOSIS — Z794 Long term (current) use of insulin: Secondary | ICD-10-CM

## 2018-12-22 DIAGNOSIS — G4733 Obstructive sleep apnea (adult) (pediatric): Secondary | ICD-10-CM | POA: Diagnosis present

## 2018-12-22 DIAGNOSIS — I5023 Acute on chronic systolic (congestive) heart failure: Secondary | ICD-10-CM | POA: Diagnosis not present

## 2018-12-22 DIAGNOSIS — R601 Generalized edema: Secondary | ICD-10-CM

## 2018-12-22 DIAGNOSIS — I4430 Unspecified atrioventricular block: Secondary | ICD-10-CM | POA: Diagnosis not present

## 2018-12-22 DIAGNOSIS — F1210 Cannabis abuse, uncomplicated: Secondary | ICD-10-CM | POA: Diagnosis present

## 2018-12-22 DIAGNOSIS — Z5900 Homelessness unspecified: Secondary | ICD-10-CM

## 2018-12-22 DIAGNOSIS — I5082 Biventricular heart failure: Secondary | ICD-10-CM | POA: Diagnosis present

## 2018-12-22 DIAGNOSIS — F200 Paranoid schizophrenia: Secondary | ICD-10-CM | POA: Diagnosis not present

## 2018-12-22 DIAGNOSIS — E1142 Type 2 diabetes mellitus with diabetic polyneuropathy: Secondary | ICD-10-CM | POA: Diagnosis present

## 2018-12-22 DIAGNOSIS — J9601 Acute respiratory failure with hypoxia: Secondary | ICD-10-CM | POA: Diagnosis present

## 2018-12-22 DIAGNOSIS — F1994 Other psychoactive substance use, unspecified with psychoactive substance-induced mood disorder: Secondary | ICD-10-CM | POA: Diagnosis not present

## 2018-12-22 DIAGNOSIS — Z1159 Encounter for screening for other viral diseases: Secondary | ICD-10-CM | POA: Diagnosis not present

## 2018-12-22 DIAGNOSIS — Z6837 Body mass index (BMI) 37.0-37.9, adult: Secondary | ICD-10-CM | POA: Diagnosis not present

## 2018-12-22 DIAGNOSIS — I441 Atrioventricular block, second degree: Secondary | ICD-10-CM | POA: Diagnosis present

## 2018-12-22 DIAGNOSIS — F209 Schizophrenia, unspecified: Secondary | ICD-10-CM | POA: Diagnosis present

## 2018-12-22 DIAGNOSIS — Z91199 Patient's noncompliance with other medical treatment and regimen due to unspecified reason: Secondary | ICD-10-CM

## 2018-12-22 DIAGNOSIS — R0609 Other forms of dyspnea: Secondary | ICD-10-CM | POA: Diagnosis not present

## 2018-12-22 DIAGNOSIS — I5043 Acute on chronic combined systolic (congestive) and diastolic (congestive) heart failure: Secondary | ICD-10-CM | POA: Diagnosis present

## 2018-12-22 DIAGNOSIS — E0865 Diabetes mellitus due to underlying condition with hyperglycemia: Secondary | ICD-10-CM | POA: Diagnosis not present

## 2018-12-22 DIAGNOSIS — Z79899 Other long term (current) drug therapy: Secondary | ICD-10-CM

## 2018-12-22 DIAGNOSIS — Z833 Family history of diabetes mellitus: Secondary | ICD-10-CM | POA: Diagnosis not present

## 2018-12-22 DIAGNOSIS — I10 Essential (primary) hypertension: Secondary | ICD-10-CM | POA: Diagnosis not present

## 2018-12-22 DIAGNOSIS — I509 Heart failure, unspecified: Secondary | ICD-10-CM

## 2018-12-22 DIAGNOSIS — F17200 Nicotine dependence, unspecified, uncomplicated: Secondary | ICD-10-CM | POA: Diagnosis not present

## 2018-12-22 LAB — POCT I-STAT 7, (LYTES, BLD GAS, ICA,H+H)
Acid-Base Excess: 6 mmol/L — ABNORMAL HIGH (ref 0.0–2.0)
Bicarbonate: 32.7 mmol/L — ABNORMAL HIGH (ref 20.0–28.0)
Calcium, Ion: 1.3 mmol/L (ref 1.15–1.40)
HCT: 31 % — ABNORMAL LOW (ref 39.0–52.0)
Hemoglobin: 10.5 g/dL — ABNORMAL LOW (ref 13.0–17.0)
O2 Saturation: 80 %
Patient temperature: 98.6
Potassium: 3.9 mmol/L (ref 3.5–5.1)
Sodium: 142 mmol/L (ref 135–145)
TCO2: 34 mmol/L — ABNORMAL HIGH (ref 22–32)
pCO2 arterial: 58.7 mmHg — ABNORMAL HIGH (ref 32.0–48.0)
pH, Arterial: 7.354 (ref 7.350–7.450)
pO2, Arterial: 48 mmHg — ABNORMAL LOW (ref 83.0–108.0)

## 2018-12-22 LAB — CBC
HCT: 29.8 % — ABNORMAL LOW (ref 39.0–52.0)
Hemoglobin: 8.7 g/dL — ABNORMAL LOW (ref 13.0–17.0)
MCH: 23.1 pg — ABNORMAL LOW (ref 26.0–34.0)
MCHC: 29.2 g/dL — ABNORMAL LOW (ref 30.0–36.0)
MCV: 79.3 fL — ABNORMAL LOW (ref 80.0–100.0)
Platelets: 273 10*3/uL (ref 150–400)
RBC: 3.76 MIL/uL — ABNORMAL LOW (ref 4.22–5.81)
RDW: 21 % — ABNORMAL HIGH (ref 11.5–15.5)
WBC: 9.3 10*3/uL (ref 4.0–10.5)
nRBC: 0.2 % (ref 0.0–0.2)

## 2018-12-22 LAB — BASIC METABOLIC PANEL
Anion gap: 8 (ref 5–15)
BUN: 19 mg/dL (ref 6–20)
CO2: 30 mmol/L (ref 22–32)
Calcium: 8.8 mg/dL — ABNORMAL LOW (ref 8.9–10.3)
Chloride: 102 mmol/L (ref 98–111)
Creatinine, Ser: 0.99 mg/dL (ref 0.61–1.24)
GFR calc Af Amer: >60 mL/min (ref >60)
GFR calc non Af Amer: >60 mL/min (ref >60)
Glucose, Bld: 187 mg/dL — ABNORMAL HIGH (ref 70–99)
Potassium: 3.7 mmol/L (ref 3.5–5.1)
Sodium: 140 mmol/L (ref 135–145)

## 2018-12-22 LAB — CBG MONITORING, ED: Glucose-Capillary: 173 mg/dL — ABNORMAL HIGH (ref 70–99)

## 2018-12-22 LAB — SARS CORONAVIRUS 2 BY RT PCR (HOSPITAL ORDER, PERFORMED IN ~~LOC~~ HOSPITAL LAB): SARS Coronavirus 2: NEGATIVE

## 2018-12-22 LAB — SURGICAL PCR SCREEN
MRSA, PCR: NEGATIVE
Staphylococcus aureus: NEGATIVE

## 2018-12-22 LAB — GLUCOSE, CAPILLARY: Glucose-Capillary: 180 mg/dL — ABNORMAL HIGH (ref 70–99)

## 2018-12-22 LAB — BRAIN NATRIURETIC PEPTIDE: B Natriuretic Peptide: 1308.2 pg/mL — ABNORMAL HIGH (ref 0.0–100.0)

## 2018-12-22 MED ORDER — SODIUM CHLORIDE 0.9 % IV SOLN: 250.0000 mL | INTRAVENOUS | Status: DC | PRN

## 2018-12-22 MED ORDER — INSULIN ASPART 100 UNIT/ML ~~LOC~~ SOLN
0.0000 [IU] | Freq: Three times a day (TID) | SUBCUTANEOUS | Status: DC
  Administered 2018-12-22 – 2018-12-23 (×4): 3 [IU] via SUBCUTANEOUS
  Administered 2018-12-24: 5 [IU] via SUBCUTANEOUS
  Administered 2018-12-24: 3 [IU] via SUBCUTANEOUS
  Administered 2018-12-24: 5 [IU] via SUBCUTANEOUS
  Administered 2018-12-25: 3 [IU] via SUBCUTANEOUS

## 2018-12-22 MED ORDER — IPRATROPIUM-ALBUTEROL 0.5-2.5 (3) MG/3ML IN SOLN: 3.0000 mL | RESPIRATORY_TRACT | Status: DC | PRN

## 2018-12-22 MED ORDER — CARBAMAZEPINE 200 MG PO TABS
100.0000 mg | ORAL_TABLET | Freq: Once | ORAL | Status: AC
  Administered 2018-12-22: 100 mg via ORAL
  Filled 2018-12-22: qty 0.5

## 2018-12-22 MED ORDER — ACETAMINOPHEN 325 MG PO TABS
650.0000 mg | ORAL_TABLET | ORAL | Status: DC | PRN
  Administered 2018-12-22 – 2018-12-25 (×8): 650 mg via ORAL
  Filled 2018-12-22 (×9): qty 2

## 2018-12-22 MED ORDER — ONDANSETRON HCL 4 MG/2ML IJ SOLN: 4.0000 mg | Freq: Four times a day (QID) | INTRAMUSCULAR | Status: DC | PRN

## 2018-12-22 MED ORDER — SODIUM CHLORIDE 0.9% FLUSH
3.0000 mL | Freq: Two times a day (BID) | INTRAVENOUS | Status: DC
  Administered 2018-12-22 – 2018-12-24 (×5): 3 mL via INTRAVENOUS

## 2018-12-22 MED ORDER — ALUM & MAG HYDROXIDE-SIMETH 200-200-20 MG/5ML PO SUSP: 30.0000 mL | ORAL | Status: DC | PRN

## 2018-12-22 MED ORDER — ARIPIPRAZOLE 10 MG PO TABS
10.0000 mg | ORAL_TABLET | Freq: Every day | ORAL | Status: DC
  Administered 2018-12-23 – 2018-12-25 (×3): 10 mg via ORAL
  Filled 2018-12-22 (×3): qty 1

## 2018-12-22 MED ORDER — MUPIROCIN 2 % EX OINT
1.0000 "application " | TOPICAL_OINTMENT | Freq: Two times a day (BID) | CUTANEOUS | Status: DC
  Administered 2018-12-22 – 2018-12-25 (×6): 1 via NASAL
  Filled 2018-12-22: qty 22

## 2018-12-22 MED ORDER — LISINOPRIL 10 MG PO TABS
10.0000 mg | ORAL_TABLET | Freq: Every day | ORAL | Status: DC
  Administered 2018-12-23 – 2018-12-25 (×3): 10 mg via ORAL
  Filled 2018-12-22 (×3): qty 1

## 2018-12-22 MED ORDER — IPRATROPIUM-ALBUTEROL 0.5-2.5 (3) MG/3ML IN SOLN
3.0000 mL | Freq: Once | RESPIRATORY_TRACT | Status: AC
  Administered 2018-12-22: 3 mL via RESPIRATORY_TRACT
  Filled 2018-12-22: qty 3

## 2018-12-22 MED ORDER — INSULIN ASPART 100 UNIT/ML ~~LOC~~ SOLN
0.0000 [IU] | Freq: Every day | SUBCUTANEOUS | Status: DC
  Administered 2018-12-22: 2 [IU] via SUBCUTANEOUS
  Administered 2018-12-23: 3 [IU] via SUBCUTANEOUS
  Administered 2018-12-24: 2 [IU] via SUBCUTANEOUS

## 2018-12-22 MED ORDER — FUROSEMIDE 10 MG/ML IJ SOLN
40.0000 mg | Freq: Once | INTRAMUSCULAR | Status: AC
  Administered 2018-12-22: 40 mg via INTRAVENOUS
  Filled 2018-12-22: qty 4

## 2018-12-22 MED ORDER — ENOXAPARIN SODIUM 40 MG/0.4ML ~~LOC~~ SOLN
40.0000 mg | SUBCUTANEOUS | Status: DC
  Administered 2018-12-22 – 2018-12-24 (×3): 40 mg via SUBCUTANEOUS
  Filled 2018-12-22 (×3): qty 0.4

## 2018-12-22 MED ORDER — LISINOPRIL 10 MG PO TABS
10.0000 mg | ORAL_TABLET | Freq: Once | ORAL | Status: AC
  Administered 2018-12-22: 10 mg via ORAL
  Filled 2018-12-22: qty 1

## 2018-12-22 MED ORDER — POTASSIUM CHLORIDE CRYS ER 20 MEQ PO TBCR
20.0000 meq | EXTENDED_RELEASE_TABLET | Freq: Once | ORAL | Status: AC
  Administered 2018-12-22: 20 meq via ORAL
  Filled 2018-12-22: qty 1

## 2018-12-22 MED ORDER — FUROSEMIDE 10 MG/ML IJ SOLN
40.0000 mg | Freq: Two times a day (BID) | INTRAMUSCULAR | Status: DC
  Administered 2018-12-22 – 2018-12-23 (×2): 40 mg via INTRAVENOUS
  Filled 2018-12-22 (×2): qty 4

## 2018-12-22 MED ORDER — SODIUM CHLORIDE 0.9% FLUSH: 3.0000 mL | INTRAVENOUS | Status: DC | PRN

## 2018-12-22 MED ORDER — SODIUM CHLORIDE 0.9% FLUSH
3.0000 mL | Freq: Once | INTRAVENOUS | Status: AC
  Administered 2018-12-22: 3 mL via INTRAVENOUS

## 2018-12-22 MED ORDER — POTASSIUM CHLORIDE CRYS ER 20 MEQ PO TBCR
20.0000 meq | EXTENDED_RELEASE_TABLET | Freq: Two times a day (BID) | ORAL | Status: DC
  Administered 2018-12-22 – 2018-12-25 (×6): 20 meq via ORAL
  Filled 2018-12-22 (×6): qty 1

## 2018-12-22 MED ORDER — CARBAMAZEPINE 200 MG PO TABS
100.0000 mg | ORAL_TABLET | Freq: Two times a day (BID) | ORAL | Status: DC
  Administered 2018-12-22 – 2018-12-25 (×6): 100 mg via ORAL
  Filled 2018-12-22 (×6): qty 0.5

## 2018-12-22 MED ORDER — ARIPIPRAZOLE 10 MG PO TABS
10.0000 mg | ORAL_TABLET | Freq: Once | ORAL | Status: AC
  Administered 2018-12-22: 10 mg via ORAL
  Filled 2018-12-22: qty 1

## 2018-12-22 MED ORDER — ACETAMINOPHEN 325 MG PO TABS
650.0000 mg | ORAL_TABLET | Freq: Once | ORAL | Status: AC
  Administered 2018-12-22: 650 mg via ORAL
  Filled 2018-12-22: qty 2

## 2018-12-22 NOTE — ED Triage Notes (Signed)
Pt called EMS due to SOB, was found sleeping on the ground, reports he did not fall.  Was initially 79% on RA, placed on 12L non-rebreather, O2 increased to 94%.  Reports fluid on abdomen and legs.
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
## 2018-12-22 NOTE — ED Notes (Signed)
Pt's O2 and pulse ox fell off while asleep. When replaced pt's sats in the 70's/80's, did not immediately come up with set 02 so raised to 5L, now resting comfortably in 90's, MD paged

## 2018-12-22 NOTE — Progress Notes (Signed)
This encounter was created in error - please disregard.
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
## 2018-12-22 NOTE — ED Notes (Signed)
Meal given to pt.
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
## 2018-12-23 ENCOUNTER — Encounter (HOSPITAL_COMMUNITY): Payer: Self-pay

## 2018-12-23 DIAGNOSIS — I441 Atrioventricular block, second degree: Secondary | ICD-10-CM

## 2018-12-23 DIAGNOSIS — J9601 Acute respiratory failure with hypoxia: Secondary | ICD-10-CM

## 2018-12-23 LAB — CBC WITH DIFFERENTIAL/PLATELET
Abs Immature Granulocytes: 0.01 10*3/uL (ref 0.00–0.07)
Basophils Absolute: 0.1 10*3/uL (ref 0.0–0.1)
Basophils Relative: 1 %
Eosinophils Absolute: 0.2 10*3/uL (ref 0.0–0.5)
Eosinophils Relative: 4 %
HCT: 29.2 % — ABNORMAL LOW (ref 39.0–52.0)
Hemoglobin: 8.4 g/dL — ABNORMAL LOW (ref 13.0–17.0)
Immature Granulocytes: 0 %
Lymphocytes Relative: 15 %
Lymphs Abs: 1 10*3/uL (ref 0.7–4.0)
MCH: 23 pg — ABNORMAL LOW (ref 26.0–34.0)
MCHC: 28.8 g/dL — ABNORMAL LOW (ref 30.0–36.0)
MCV: 80 fL (ref 80.0–100.0)
Monocytes Absolute: 0.7 10*3/uL (ref 0.1–1.0)
Monocytes Relative: 11 %
Neutro Abs: 4.6 10*3/uL (ref 1.7–7.7)
Neutrophils Relative %: 69 %
Platelets: 251 10*3/uL (ref 150–400)
RBC: 3.65 MIL/uL — ABNORMAL LOW (ref 4.22–5.81)
RDW: 20.8 % — ABNORMAL HIGH (ref 11.5–15.5)
WBC: 6.6 10*3/uL (ref 4.0–10.5)
nRBC: 0 % (ref 0.0–0.2)

## 2018-12-23 LAB — GLUCOSE, CAPILLARY
Glucose-Capillary: 203 mg/dL — ABNORMAL HIGH (ref 70–99)
Glucose-Capillary: 161 mg/dL — ABNORMAL HIGH (ref 70–99)
Glucose-Capillary: 198 mg/dL — ABNORMAL HIGH (ref 70–99)
Glucose-Capillary: 195 mg/dL — ABNORMAL HIGH (ref 70–99)
Glucose-Capillary: 282 mg/dL — ABNORMAL HIGH (ref 70–99)

## 2018-12-23 LAB — BASIC METABOLIC PANEL
Anion gap: 8 (ref 5–15)
BUN: 19 mg/dL (ref 6–20)
CO2: 29 mmol/L (ref 22–32)
Calcium: 8.8 mg/dL — ABNORMAL LOW (ref 8.9–10.3)
Chloride: 102 mmol/L (ref 98–111)
Creatinine, Ser: 0.92 mg/dL (ref 0.61–1.24)
GFR calc Af Amer: >60 mL/min (ref >60)
GFR calc non Af Amer: >60 mL/min (ref >60)
Glucose, Bld: 195 mg/dL — ABNORMAL HIGH (ref 70–99)
Potassium: 4 mmol/L (ref 3.5–5.1)
Sodium: 139 mmol/L (ref 135–145)

## 2018-12-23 MED ORDER — FUROSEMIDE 10 MG/ML IJ SOLN
60.0000 mg | Freq: Two times a day (BID) | INTRAMUSCULAR | Status: DC
  Administered 2018-12-23 – 2018-12-25 (×4): 60 mg via INTRAVENOUS
  Filled 2018-12-23 (×4): qty 6
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
## 2018-12-23 NOTE — Progress Notes (Signed)
Pt refused in and out cath as well as foley placement after being told his bladder scan read 427 ml. Michela Pitcher he will use urinal or bathroom in pt room.

## 2018-12-23 NOTE — Clinical Social Work Note (Signed)
CSW acknowledges homeless consult. Patient has been given shelter resources on numerous occassions and has been known to refuse them as well. Patient has a home but refuses to live there. He was staying at the [facility] 2 or 3 admissions ago and last admission left to go stay with his cousin. If patient actually wants shelter resources, please consult again.  [place]ton Scrape, [place]

## 2018-12-23 NOTE — Plan of Care (Signed)
  Problem: Clinical Measurements: Goal: Ability to maintain clinical measurements within normal limits will improve Outcome: Progressing Goal: Will remain free from infection Outcome: Progressing Goal: Diagnostic test results will improve Outcome: Progressing Goal: Respiratory complications will improve Outcome: Progressing Goal: Cardiovascular complication will be avoided Outcome: Progressing   Problem: Activity: Goal: Risk for activity intolerance will decrease Outcome: Progressing   Problem: Nutrition: Goal: Adequate nutrition will be maintained Outcome: Progressing   Problem: Elimination: Goal: Will not experience complications related to urinary retention Outcome: Progressing   Problem: Safety: Goal: Ability to remain free from injury will improve Outcome: Progressing   Problem: Skin Integrity: Goal: Risk for impaired skin integrity will decrease Outcome: Progressing   Problem: Education: Goal: Knowledge of General Education information will improve Description: Including pain rating scale, medication(s)/side effects and non-pharmacologic comfort measures Outcome: Not Progressing   Problem: Health Behavior/Discharge Planning: Goal: Ability to manage health-related needs will improve Outcome: Not Progressing   Problem: Coping: Goal: Level of anxiety will decrease Outcome: Not Progressing   Problem: Elimination: Goal: Will not experience complications related to bowel motility Outcome: Not Progressing   Problem: Pain Managment: Goal: General experience of comfort will improve Outcome: Not Progressing
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
## 2018-12-23 NOTE — Progress Notes (Signed)
Much effort required to adhere to 1200 fluid restriction. Extra food given per pt request. Mood lability evident, at present redirectable with no violent interactions. Pt requests social worker as he "wishes to be in a nursing home". Please refer to social work note.

## 2018-12-24 DIAGNOSIS — R601 Generalized edema: Secondary | ICD-10-CM

## 2018-12-24 DIAGNOSIS — E0865 Diabetes mellitus due to underlying condition with hyperglycemia: Secondary | ICD-10-CM

## 2018-12-24 DIAGNOSIS — Z9119 Patient's noncompliance with other medical treatment and regimen: Secondary | ICD-10-CM

## 2018-12-24 DIAGNOSIS — Z590 Homelessness: Secondary | ICD-10-CM

## 2018-12-24 LAB — GLUCOSE, CAPILLARY
Glucose-Capillary: 219 mg/dL — ABNORMAL HIGH (ref 70–99)
Glucose-Capillary: 174 mg/dL — ABNORMAL HIGH (ref 70–99)
Glucose-Capillary: 217 mg/dL — ABNORMAL HIGH (ref 70–99)
Glucose-Capillary: 207 mg/dL — ABNORMAL HIGH (ref 70–99)

## 2018-12-24 LAB — BASIC METABOLIC PANEL
Anion gap: 8 (ref 5–15)
BUN: 21 mg/dL — ABNORMAL HIGH (ref 6–20)
CO2: 30 mmol/L (ref 22–32)
Calcium: 8.8 mg/dL — ABNORMAL LOW (ref 8.9–10.3)
Chloride: 101 mmol/L (ref 98–111)
Creatinine, Ser: 0.9 mg/dL (ref 0.61–1.24)
GFR calc Af Amer: >60 mL/min (ref >60)
GFR calc non Af Amer: >60 mL/min (ref >60)
Glucose, Bld: 183 mg/dL — ABNORMAL HIGH (ref 70–99)
Potassium: 4.2 mmol/L (ref 3.5–5.1)
Sodium: 139 mmol/L (ref 135–145)

## 2018-12-24 MED ORDER — INSULIN GLARGINE 100 UNIT/ML ~~LOC~~ SOLN
12.0000 [IU] | Freq: Every day | SUBCUTANEOUS | Status: DC
  Administered 2018-12-24: 12 [IU] via SUBCUTANEOUS
  Filled 2018-12-24 (×2): qty 0.12

## 2018-12-24 NOTE — Progress Notes (Addendum)
Progress Note  Patient Name: Taylor MemosFrederick L Mantei Date of Encounter: 12/24/2018  Primary Cardiologist: Kristeen MissPhilip Nahser, MD   Subjective   Pt breathing better than on presentation, but still short of breath with exertion like going from bed to chair. Still has edema legs up to abdomen. No chest pain. He denies any lightheadedness or near syncope now or prior to admission.  He says that his problem is that he has no home, lives on the streets and every time he leaves the hospital he does crack. That is why he was on the ground. He says that he "desperately" wants nursing home placement. He says that he will behave.   Inpatient Medications    Scheduled Meds: . ARIPiprazole  10 mg Oral Daily  . carbamazepine  100 mg Oral BID  . enoxaparin (LOVENOX) injection  40 mg Subcutaneous Q24H  . furosemide  60 mg Intravenous BID  . insulin aspart  0-15 Units Subcutaneous TID WC  . insulin aspart  0-5 Units Subcutaneous QHS  . lisinopril  10 mg Oral Daily  . mupirocin ointment  1 application Nasal BID  . potassium chloride SA  20 mEq Oral BID  . sodium chloride flush  3 mL Intravenous Q12H   Continuous Infusions: . sodium chloride     PRN Meds: sodium chloride, acetaminophen, alum & mag hydroxide-simeth, ipratropium-albuterol, ondansetron (ZOFRAN) IV, sodium chloride flush   Vital Signs    Vitals:   12/23/18 2015 12/23/18 2017 12/24/18 0532 12/24/18 0750  BP: (!) 141/91   140/86  Pulse:      Resp: (!) 32 19  11  Temp: 97.8 F (36.6 C)   98 F (36.7 C)  TempSrc: Oral   Oral  SpO2: 92%     Weight:   111 kg   Height:        Intake/Output Summary (Last 24 hours) at 12/24/2018 0907 Last data filed at 12/24/2018 0855 Gross per 24 hour  Intake 2662 ml  Output 2375 ml  Net 287 ml   Last 3 Weights 12/24/2018 12/23/2018 12/22/2018  Weight (lbs) 244 lb 11.4 oz 244 lb 3.2 oz 255 lb  Weight (kg) 111 kg 110.768 kg 115.667 kg  Some encounter information is confidential and restricted. Go to  Review Flowsheets activity to see all data.      Telemetry    SR in the 90's with intermittent AVB block - Personally Reviewed  ECG    No new tracings - Personally Reviewed  Physical Exam   GEN: No acute distress.   Neck: + JVD Cardiac: RRR, no murmurs, rubs, or gallops.  Respiratory: Scattered crackles GI: large abdomen, firm with fluid in lower half MS: Significant tight edema, legs up to abdomen Neuro:  pressured speech Psych: Mildly agitated  Labs    High Sensitivity Troponin:  No results for input(s): TROPONINIHS in the last 720 hours.    Cardiac Enzymes Recent Labs  Lab 12/18/18 2332  TROPONINI 0.03*   No results for input(s): TROPIPOC in the last 168 hours.   Chemistry Recent Labs  Lab 12/19/18 1115  12/22/18 0630 12/22/18 1655 12/23/18 0223 12/24/18 0311  NA 141   < > 140 142 139 139  K 4.1   < > 3.7 3.9 4.0 4.2  CL 101   < > 102  --  102 101  CO2 30   < > 30  --  29 30  GLUCOSE 256*   < > 187*  --  195* 183*  BUN 21*   < > 19  --  19 21*  CREATININE 0.98   < > 0.99  --  0.92 0.90  CALCIUM 9.0   < > 8.8*  --  8.8* 8.8*  PROT 7.5  --   --   --   --   --   ALBUMIN 2.9*  --   --   --   --   --   AST 26  --   --   --   --   --   ALT 18  --   --   --   --   --   ALKPHOS 198*  --   --   --   --   --   BILITOT 0.8  --   --   --   --   --   GFRNONAA >60   < > >60  --  >60 >60  GFRAA >60   < > >60  --  >60 >60  ANIONGAP 10   < > 8  --  8 8   < > = values in this interval not displayed.     Hematology Recent Labs  Lab 12/20/18 1022 12/22/18 0630 12/22/18 1655 12/23/18 0223  WBC 6.7 9.3  --  6.6  RBC 3.59* 3.76*  --  3.65*  HGB 8.3* 8.7* 10.5* 8.4*  HCT 28.9* 29.8* 31.0* 29.2*  MCV 80.5 79.3*  --  80.0  MCH 23.1* 23.1*  --  23.0*  MCHC 28.7* 29.2*  --  28.8*  RDW 21.1* 21.0*  --  20.8*  PLT 203 273  --  251    BNP Recent Labs  Lab 12/18/18 2350 12/22/18 0750  BNP 974.0* 1,610.91,308.2*     DDimer No results for input(s): DDIMER in the last  168 hours.   Radiology    No results found.  Cardiac Studies   Echocardiogram 11/07/2018: Impressions: 1. The left ventricle has severely reduced systolic function, with an ejection fraction of 25-30%. The cavity size was mildly dilated. There is moderate concentric left ventricular hypertrophy. Left ventricular diastolic Doppler parameters are  consistent with restrictive filling. Elevated left atrial and left ventricular end-diastolic pressures There is right ventricular volume and pressure overload. Left ventricular diffuse hypokinesis. 2. The right ventricle has severely reduced systolic function. The cavity was moderately enlarged. There is no increase in right ventricular wall thickness. Right ventricular systolic pressure is moderately elevated with an estimated pressure of 51.2  mmHg. 3. Left atrial size was moderately dilated. 4. Right atrial size was severely dilated. 5. Small pericardial effusion. 6. The pericardial effusion is posterior to the left ventricle. 7. Mild thickening of the mitral valve leaflet. No evidence of mitral valve stenosis. 8. The tricuspid valve is grossly normal. Tricuspid valve regurgitation is moderate. 9. The aortic valve is tricuspid. Mild thickening of the aortic valve. Mild calcification of the aortic valve. Aortic valve regurgitation is mild by color flow Doppler. No stenosis of the aortic valve. 10. The inferior vena cava was dilated in size with <50% respiratory variability.  Summary: There is no significant difference since the prior study on 09/01/2017, there is severe biventricular failure, LVEDP is severely elevated.   Patient Profile     58 y.o. male with a hx of chronic systolic heart failure EF 25-30%, type 2 DM, HTN, schizophrenia, polysubstance abuse, histoty of HCV, OSA, morbid obesity who is being seen for the evaluation of bradycardia and heart failure.   Assessment & Plan  Acute on chronic systolic heart failure -On IV  lasix 60 mg IV BID with good urine output, 2.3L yesterday. Wt is down 11 lbs (? accuracy with differing scales). -Renal function is normal, SCr 0.90 today.  -Pt reports breathing better but still has DOE with minimal exertion. No chest pain.  -Still considerably volume overloaded. Continue diuresis.  AV Block, asymptomatic -Pt with intermittent high grade AV block. On tele review, lasts usually 3-9 seconds, up to 20 seconds.  -Pt was found on the ground and there is question of he had passed out. He says it was due to using Crack. He denies any lightheadedness, near syncope or syncope.  -Difficult situation with pt being homeless and non-compliant. On last admission seen by Dr. Lovena Le and felt not to be good candidate for pacemaker. Will see if Dr. Irish Lack talked to EP this admission.  -No AV nodal blocking agents.   Acute respiratory failure -Now on room air.  Hypertension -On ACE-I -BP fairly well controlled.   Social issues -Pt says that his problem is that he has no home, lives on the streets and every time he leaves the hospital he does crack. That is why he was on the ground. He says he and his father lost their home in 2010.  He says that he "desperately" wants nursing home placement. He says that he will behave. He says he wants to talk to the Education officer, museum. I will place order.    For questions or updates, please contact [facility] Please consult [email or web address] for contact info under        Signed, Daune Perch, NP  12/24/2018, 9:07 AM    I have examined the patient and reviewed assessment and plan and discussed with patient.  Agree with above as stated.    Discussed with EP.  Narrow QRS.  Not a good candidate for BiV ICD.  Unclear whether he had sx from the heart block.  COntinue to avoid rate slowing drugs.  Needs to be compliant with diuretics to avoid furtehr heart failure.  Social work consult is his biggest concern to help deal with homelessness.  Larae Grooms
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
## 2018-12-25 ENCOUNTER — Other Ambulatory Visit: Payer: Self-pay

## 2018-12-25 ENCOUNTER — Inpatient Hospital Stay (HOSPITAL_COMMUNITY)
Admission: EM | Admit: 2018-12-25 | Discharge: 2018-12-30 | DRG: 291 | Discharge status: Against Medical Advice | Payer: Medicare Other | Attending: Internal Medicine | Admitting: Internal Medicine

## 2018-12-25 ENCOUNTER — Emergency Department (HOSPITAL_COMMUNITY): Payer: Medicare Other

## 2018-12-25 ENCOUNTER — Encounter (HOSPITAL_COMMUNITY): Payer: Self-pay | Admitting: Emergency Medicine

## 2018-12-25 DIAGNOSIS — E785 Hyperlipidemia, unspecified: Secondary | ICD-10-CM | POA: Diagnosis present

## 2018-12-25 DIAGNOSIS — Z5900 Homelessness unspecified: Secondary | ICD-10-CM

## 2018-12-25 DIAGNOSIS — F1420 Cocaine dependence, uncomplicated: Secondary | ICD-10-CM | POA: Diagnosis present

## 2018-12-25 DIAGNOSIS — F17210 Nicotine dependence, cigarettes, uncomplicated: Secondary | ICD-10-CM | POA: Diagnosis present

## 2018-12-25 DIAGNOSIS — Z833 Family history of diabetes mellitus: Secondary | ICD-10-CM

## 2018-12-25 DIAGNOSIS — I714 Abdominal aortic aneurysm, without rupture: Secondary | ICD-10-CM | POA: Diagnosis present

## 2018-12-25 DIAGNOSIS — R634 Abnormal weight loss: Secondary | ICD-10-CM | POA: Diagnosis present

## 2018-12-25 DIAGNOSIS — B1920 Unspecified viral hepatitis C without hepatic coma: Secondary | ICD-10-CM | POA: Diagnosis present

## 2018-12-25 DIAGNOSIS — I872 Venous insufficiency (chronic) (peripheral): Secondary | ICD-10-CM | POA: Diagnosis present

## 2018-12-25 DIAGNOSIS — Z6834 Body mass index (BMI) 34.0-34.9, adult: Secondary | ICD-10-CM

## 2018-12-25 DIAGNOSIS — J9621 Acute and chronic respiratory failure with hypoxia: Secondary | ICD-10-CM | POA: Diagnosis present

## 2018-12-25 DIAGNOSIS — Z888 Allergy status to other drugs, medicaments and biological substances status: Secondary | ICD-10-CM

## 2018-12-25 DIAGNOSIS — Z8249 Family history of ischemic heart disease and other diseases of the circulatory system: Secondary | ICD-10-CM

## 2018-12-25 DIAGNOSIS — F200 Paranoid schizophrenia: Secondary | ICD-10-CM | POA: Diagnosis present

## 2018-12-25 DIAGNOSIS — D509 Iron deficiency anemia, unspecified: Secondary | ICD-10-CM | POA: Diagnosis present

## 2018-12-25 DIAGNOSIS — I5043 Acute on chronic combined systolic (congestive) and diastolic (congestive) heart failure: Secondary | ICD-10-CM | POA: Diagnosis present

## 2018-12-25 DIAGNOSIS — I5023 Acute on chronic systolic (congestive) heart failure: Secondary | ICD-10-CM | POA: Diagnosis present

## 2018-12-25 DIAGNOSIS — R109 Unspecified abdominal pain: Secondary | ICD-10-CM | POA: Diagnosis present

## 2018-12-25 DIAGNOSIS — G4733 Obstructive sleep apnea (adult) (pediatric): Secondary | ICD-10-CM | POA: Diagnosis present

## 2018-12-25 DIAGNOSIS — E669 Obesity, unspecified: Secondary | ICD-10-CM | POA: Diagnosis present

## 2018-12-25 DIAGNOSIS — I441 Atrioventricular block, second degree: Secondary | ICD-10-CM | POA: Diagnosis present

## 2018-12-25 DIAGNOSIS — Z7951 Long term (current) use of inhaled steroids: Secondary | ICD-10-CM

## 2018-12-25 DIAGNOSIS — Z79899 Other long term (current) drug therapy: Secondary | ICD-10-CM

## 2018-12-25 DIAGNOSIS — D649 Anemia, unspecified: Secondary | ICD-10-CM | POA: Diagnosis present

## 2018-12-25 DIAGNOSIS — R0602 Shortness of breath: Secondary | ICD-10-CM

## 2018-12-25 DIAGNOSIS — F329 Major depressive disorder, single episode, unspecified: Secondary | ICD-10-CM | POA: Diagnosis present

## 2018-12-25 DIAGNOSIS — Z7984 Long term (current) use of oral hypoglycemic drugs: Secondary | ICD-10-CM

## 2018-12-25 DIAGNOSIS — Z9119 Patient's noncompliance with other medical treatment and regimen: Secondary | ICD-10-CM

## 2018-12-25 DIAGNOSIS — N5089 Other specified disorders of the male genital organs: Secondary | ICD-10-CM | POA: Diagnosis present

## 2018-12-25 DIAGNOSIS — I110 Hypertensive heart disease with heart failure: Principal | ICD-10-CM | POA: Diagnosis present

## 2018-12-25 DIAGNOSIS — K429 Umbilical hernia without obstruction or gangrene: Secondary | ICD-10-CM | POA: Diagnosis present

## 2018-12-25 DIAGNOSIS — E119 Type 2 diabetes mellitus without complications: Secondary | ICD-10-CM

## 2018-12-25 DIAGNOSIS — R0902 Hypoxemia: Secondary | ICD-10-CM

## 2018-12-25 DIAGNOSIS — I10 Essential (primary) hypertension: Secondary | ICD-10-CM | POA: Diagnosis present

## 2018-12-25 DIAGNOSIS — Z5329 Procedure and treatment not carried out because of patient's decision for other reasons: Secondary | ICD-10-CM | POA: Diagnosis present

## 2018-12-25 DIAGNOSIS — F17200 Nicotine dependence, unspecified, uncomplicated: Secondary | ICD-10-CM

## 2018-12-25 DIAGNOSIS — Z20828 Contact with and (suspected) exposure to other viral communicable diseases: Secondary | ICD-10-CM | POA: Diagnosis present

## 2018-12-25 DIAGNOSIS — L819 Disorder of pigmentation, unspecified: Secondary | ICD-10-CM | POA: Diagnosis present

## 2018-12-25 LAB — GLUCOSE, CAPILLARY
Glucose-Capillary: 160 mg/dL — ABNORMAL HIGH (ref 70–99)
Glucose-Capillary: 276 mg/dL — ABNORMAL HIGH (ref 70–99)

## 2018-12-25 LAB — CBC WITH DIFFERENTIAL/PLATELET
Abs Immature Granulocytes: 0.02 10*3/uL (ref 0.00–0.07)
Basophils Absolute: 0 10*3/uL (ref 0.0–0.1)
Basophils Relative: 1 %
Eosinophils Absolute: 0.2 10*3/uL (ref 0.0–0.5)
Eosinophils Relative: 2 %
HCT: 28.6 % — ABNORMAL LOW (ref 39.0–52.0)
Hemoglobin: 8.3 g/dL — ABNORMAL LOW (ref 13.0–17.0)
Immature Granulocytes: 0 %
Lymphocytes Relative: 8 %
Lymphs Abs: 0.7 10*3/uL (ref 0.7–4.0)
MCH: 22.9 pg — ABNORMAL LOW (ref 26.0–34.0)
MCHC: 29 g/dL — ABNORMAL LOW (ref 30.0–36.0)
MCV: 79 fL — ABNORMAL LOW (ref 80.0–100.0)
Monocytes Absolute: 0.8 10*3/uL (ref 0.1–1.0)
Monocytes Relative: 9 %
Neutro Abs: 6.7 10*3/uL (ref 1.7–7.7)
Neutrophils Relative %: 80 %
Platelets: 296 10*3/uL (ref 150–400)
RBC: 3.62 MIL/uL — ABNORMAL LOW (ref 4.22–5.81)
RDW: 20.9 % — ABNORMAL HIGH (ref 11.5–15.5)
WBC: 8.4 10*3/uL (ref 4.0–10.5)
nRBC: 0 % (ref 0.0–0.2)

## 2018-12-25 LAB — BASIC METABOLIC PANEL
Anion gap: 9 (ref 5–15)
BUN: 20 mg/dL (ref 6–20)
CO2: 29 mmol/L (ref 22–32)
Calcium: 8.8 mg/dL — ABNORMAL LOW (ref 8.9–10.3)
Chloride: 101 mmol/L (ref 98–111)
Creatinine, Ser: 0.85 mg/dL (ref 0.61–1.24)
GFR calc Af Amer: >60 mL/min (ref >60)
GFR calc non Af Amer: >60 mL/min (ref >60)
Glucose, Bld: 196 mg/dL — ABNORMAL HIGH (ref 70–99)
Potassium: 4 mmol/L (ref 3.5–5.1)
Sodium: 139 mmol/L (ref 135–145)

## 2018-12-25 LAB — COMPREHENSIVE METABOLIC PANEL
ALT: 15 U/L (ref 0–44)
AST: 22 U/L (ref 15–41)
Albumin: 2.8 g/dL — ABNORMAL LOW (ref 3.5–5.0)
Alkaline Phosphatase: 181 U/L — ABNORMAL HIGH (ref 38–126)
Anion gap: 10 (ref 5–15)
BUN: 16 mg/dL (ref 6–20)
CO2: 29 mmol/L (ref 22–32)
Calcium: 8.6 mg/dL — ABNORMAL LOW (ref 8.9–10.3)
Chloride: 99 mmol/L (ref 98–111)
Creatinine, Ser: 1.05 mg/dL (ref 0.61–1.24)
GFR calc Af Amer: >60 mL/min (ref >60)
GFR calc non Af Amer: >60 mL/min (ref >60)
Glucose, Bld: 231 mg/dL — ABNORMAL HIGH (ref 70–99)
Potassium: 3.8 mmol/L (ref 3.5–5.1)
Sodium: 138 mmol/L (ref 135–145)
Total Bilirubin: 0.6 mg/dL (ref 0.3–1.2)
Total Protein: 6.8 g/dL (ref 6.5–8.1)

## 2018-12-25 LAB — SARS CORONAVIRUS 2 BY RT PCR (HOSPITAL ORDER, PERFORMED IN ~~LOC~~ HOSPITAL LAB): SARS Coronavirus 2: NEGATIVE

## 2018-12-25 LAB — ETHANOL: Alcohol, Ethyl (B): <10 mg/dL (ref <10)

## 2018-12-25 LAB — BRAIN NATRIURETIC PEPTIDE: B Natriuretic Peptide: 989.4 pg/mL — ABNORMAL HIGH (ref 0.0–100.0)

## 2018-12-25 LAB — LIPASE, BLOOD: Lipase: 25 U/L (ref 11–51)

## 2018-12-25 MED ORDER — NICOTINE 21 MG/24HR TD PT24
21.0000 mg | MEDICATED_PATCH | Freq: Every day | TRANSDERMAL | Status: DC
  Administered 2018-12-25 – 2018-12-30 (×6): 21 mg via TRANSDERMAL
  Filled 2018-12-25 (×6): qty 1

## 2018-12-25 MED ORDER — IPRATROPIUM-ALBUTEROL 0.5-2.5 (3) MG/3ML IN SOLN
3.0000 mL | Freq: Four times a day (QID) | RESPIRATORY_TRACT | Status: DC
  Administered 2018-12-26 (×4): 3 mL via RESPIRATORY_TRACT
  Filled 2018-12-25 (×4): qty 3

## 2018-12-25 MED ORDER — FUROSEMIDE 10 MG/ML IJ SOLN
60.0000 mg | Freq: Once | INTRAMUSCULAR | Status: AC
  Administered 2018-12-25: 60 mg via INTRAVENOUS
  Filled 2018-12-25: qty 6

## 2018-12-25 MED ORDER — CARBAMAZEPINE 200 MG PO TABS
100.0000 mg | ORAL_TABLET | Freq: Two times a day (BID) | ORAL | Status: DC
  Administered 2018-12-25 – 2018-12-30 (×10): 100 mg via ORAL
  Filled 2018-12-25 (×11): qty 0.5

## 2018-12-25 MED ORDER — ONDANSETRON HCL 4 MG/2ML IJ SOLN: 4.0000 mg | Freq: Four times a day (QID) | INTRAMUSCULAR | Status: DC | PRN

## 2018-12-25 MED ORDER — DM-GUAIFENESIN ER 30-600 MG PO TB12
1.0000 | ORAL_TABLET | Freq: Two times a day (BID) | ORAL | Status: DC | PRN
  Filled 2018-12-25: qty 1

## 2018-12-25 MED ORDER — INSULIN ASPART 100 UNIT/ML ~~LOC~~ SOLN
0.0000 [IU] | Freq: Every day | SUBCUTANEOUS | Status: DC
  Administered 2018-12-25: 3 [IU] via SUBCUTANEOUS
  Administered 2018-12-26 – 2018-12-28 (×3): 2 [IU] via SUBCUTANEOUS

## 2018-12-25 MED ORDER — ALBUTEROL SULFATE (2.5 MG/3ML) 0.083% IN NEBU
3.0000 mL | INHALATION_SOLUTION | Freq: Four times a day (QID) | RESPIRATORY_TRACT | Status: DC | PRN
  Administered 2018-12-27: 3 mL via RESPIRATORY_TRACT
  Filled 2018-12-25: qty 3

## 2018-12-25 MED ORDER — PANTOPRAZOLE SODIUM 40 MG PO TBEC
40.0000 mg | DELAYED_RELEASE_TABLET | Freq: Every day | ORAL | Status: DC
  Administered 2018-12-25 – 2018-12-29 (×5): 40 mg via ORAL
  Filled 2018-12-25 (×5): qty 1

## 2018-12-25 MED ORDER — ASPIRIN EC 81 MG PO TBEC
81.0000 mg | DELAYED_RELEASE_TABLET | Freq: Every day | ORAL | Status: DC
  Administered 2018-12-25 – 2018-12-30 (×6): 81 mg via ORAL
  Filled 2018-12-25 (×6): qty 1

## 2018-12-25 MED ORDER — ARIPIPRAZOLE 5 MG PO TABS
10.0000 mg | ORAL_TABLET | Freq: Every day | ORAL | Status: DC
  Administered 2018-12-26 – 2018-12-30 (×5): 10 mg via ORAL
  Filled 2018-12-25 (×5): qty 2

## 2018-12-25 MED ORDER — SODIUM CHLORIDE 0.9% FLUSH
3.0000 mL | Freq: Two times a day (BID) | INTRAVENOUS | Status: DC
  Administered 2018-12-25 – 2018-12-29 (×9): 3 mL via INTRAVENOUS

## 2018-12-25 MED ORDER — ZOLPIDEM TARTRATE 5 MG PO TABS
5.0000 mg | ORAL_TABLET | Freq: Once | ORAL | Status: AC
  Administered 2018-12-25: 5 mg via ORAL
  Filled 2018-12-25: qty 1

## 2018-12-25 MED ORDER — INSULIN ASPART 100 UNIT/ML ~~LOC~~ SOLN
0.0000 [IU] | Freq: Three times a day (TID) | SUBCUTANEOUS | Status: DC
  Administered 2018-12-26: 2 [IU] via SUBCUTANEOUS
  Administered 2018-12-26: 1 [IU] via SUBCUTANEOUS
  Administered 2018-12-26 – 2018-12-27 (×4): 2 [IU] via SUBCUTANEOUS
  Administered 2018-12-28: 3 [IU] via SUBCUTANEOUS
  Administered 2018-12-28: 9 [IU] via SUBCUTANEOUS
  Administered 2018-12-29 (×2): 3 [IU] via SUBCUTANEOUS
  Administered 2018-12-29: 2 [IU] via SUBCUTANEOUS
  Administered 2018-12-30 (×2): 3 [IU] via SUBCUTANEOUS

## 2018-12-25 MED ORDER — ACETAMINOPHEN 325 MG PO TABS
650.0000 mg | ORAL_TABLET | ORAL | Status: DC | PRN
  Administered 2018-12-25 – 2018-12-30 (×16): 650 mg via ORAL
  Filled 2018-12-25 (×16): qty 2

## 2018-12-25 MED ORDER — SODIUM CHLORIDE 0.9 % IV SOLN: 250.0000 mL | INTRAVENOUS | Status: DC | PRN

## 2018-12-25 MED ORDER — LISINOPRIL 10 MG PO TABS
10.0000 mg | ORAL_TABLET | Freq: Every day | ORAL | Status: DC
  Administered 2018-12-26 – 2018-12-30 (×5): 10 mg via ORAL
  Filled 2018-12-25 (×5): qty 1

## 2018-12-25 MED ORDER — ORAL CARE MOUTH RINSE
15.0000 mL | Freq: Two times a day (BID) | OROMUCOSAL | Status: DC
  Administered 2018-12-26 – 2018-12-29 (×4): 15 mL via OROMUCOSAL

## 2018-12-25 MED ORDER — FUROSEMIDE 10 MG/ML IJ SOLN
40.0000 mg | Freq: Two times a day (BID) | INTRAMUSCULAR | Status: DC
  Administered 2018-12-26 – 2018-12-28 (×5): 40 mg via INTRAVENOUS
  Filled 2018-12-25 (×5): qty 4

## 2018-12-25 MED ORDER — SODIUM CHLORIDE 0.9% FLUSH: 3.0000 mL | INTRAVENOUS | Status: DC | PRN

## 2018-12-25 MED ORDER — HYDRALAZINE HCL 20 MG/ML IJ SOLN: 5.0000 mg | INTRAMUSCULAR | Status: DC | PRN

## 2018-12-25 MED ORDER — ENOXAPARIN SODIUM 40 MG/0.4ML ~~LOC~~ SOLN
40.0000 mg | SUBCUTANEOUS | Status: DC
  Administered 2018-12-25 – 2018-12-29 (×5): 40 mg via SUBCUTANEOUS
  Filled 2018-12-25 (×5): qty 0.4

## 2018-12-25 MED ORDER — IPRATROPIUM-ALBUTEROL 0.5-2.5 (3) MG/3ML IN SOLN: 3.0000 mL | Freq: Four times a day (QID) | RESPIRATORY_TRACT | Status: DC

## 2018-12-25 NOTE — ED Notes (Signed)
O2 sat 84-85% on RA.  2L O2 applied via [place] w/ improvement in O2 sat to 90%.  Will inform Dr. Vanita Panda.

## 2018-12-25 NOTE — H&P (Addendum)
History and Physical    Taylor MemosFrederick L [name] ZHY:[id number] DOB: 06/12/1961 DOA: 12/25/2018  Referring MD/NP/PA:   PCP: Patient, No Pcp Per   Patient coming from:  The patient is coming homeless.  At baseline, pt is independent for most of ADL.        Chief Complaint: SOB  HPI: Taylor MemosFrederick L [name] is a 58 y.o. male with medical history significant of homelessness, hypertension, hyperlipidemia, diabetes mellitus, polysubstance abuse, tobacco abuse, cocaine abuse, depression, anxiety, HCV, [name] with EF of 25-30%, schizophrenia, AAA, Mobitz type II AV block, who presents with shortness of breath.  Patient was recently hospitalized from 6/23-6/26 due to CHF exacerbation.  He left hospital on AMA.  He comes back today with worsening shortness of breath.  Patient states that his shortness breath has been progressively worsening in the past several days.  He has cough with little mucus production.  Denies chest pain, fever or chills.  Patient states that he has nausea and vomited once without blood in the vomitus.  No diarrhea.  He states that he has intermittent abdominal pain, which is located in the middle of the abdomen, 5-7 out of 10 severity, dull, nonradiating.  No symptoms of UTI or unilateral weakness.  Patient states that he has worsening bilateral leg edema recently.  ED Course: pt was found to have BNP 99, alcohol level less than 10, pending COVID-19 test, electrolytes renal function okay, temperature 99, tachycardia, tachypnea, oxygen saturation 85% on room air, chest x-ray showed cardiomegaly and pulmonary edema.  Patient is placed on telemetry bed for observation.  Review of Systems:   General: no fevers, chills, has fatigue HEENT: no blurry vision, hearing changes or sore throat Respiratory: has dyspnea, coughing, no wheezing CV: no chest pain, no palpitations GI: has nausea, vomiting, abdominal pain, no diarrhea, constipation GU: no dysuria, burning on urination, increased urinary  frequency, hematuria  Ext: has leg edema Neuro: no unilateral weakness, numbness, or tingling, no vision change or hearing loss Skin: no rash, no skin tear. MSK: No muscle spasm, no deformity, no limitation of range of movement in spin Heme: No easy bruising.  Travel history: No recent long distant travel.  Allergy:  Allergies  Allergen Reactions  . Haldol [Haloperidol] Other (See Comments)    Muscle spasms, loss of voluntary movement. However, pt has taken Thorazine on multiple occasions with no adverse effects.     Past Medical History:  Diagnosis Date  . Chronic foot pain   . Cocaine abuse (HCC)   . Diabetes mellitus without complication (HCC)   . Hepatitis C    unsure   . Homelessness   . Hypertension   . Neuropathy   . Polysubstance abuse (HCC)   . Schizophrenia (HCC)   . Sleep apnea   . Systolic and diastolic CHF, chronic (HCC)     Past Surgical History:  Procedure Laterality Date  . MULTIPLE TOOTH EXTRACTIONS      Social History:  reports that he has been smoking cigarettes. He has a 20.00 pack-year smoking history. He uses smokeless tobacco. He reports current alcohol use. He reports current drug use. Frequency: 7.00 times per week. Drugs: "Crack" cocaine, Cocaine, and Marijuana.  Family History:  Family History  Problem Relation Age of Onset  . Hypertension Other   . Diabetes Other      Prior to Admission medications   Medication Sig Start Date End Date Taking? Authorizing Provider  ARIPiprazole (ABILIFY) 10 MG tablet Take 1 tablet (10 mg total) by  mouth daily. 12/10/18   Pokhrel, Corrie Mckusick, MD  carbamazepine (TEGRETOL) 200 MG tablet Take 0.5 tablets (100 mg total) by mouth 2 (two) times daily. 11/26/18   Daleen Bo, MD  furosemide (LASIX) 40 MG tablet Take 1 tablet (40 mg total) by mouth 2 (two) times daily for 30 days. 11/26/18 12/26/18  Daleen Bo, MD  glipiZIDE (GLUCOTROL) 5 MG tablet Take 0.5 tablets (2.5 mg total) by mouth 2 (two) times daily before a  meal. Patient not taking: Reported on 12/19/2018 12/18/18   Georgette Shell, MD  Ipratropium-Albuterol (COMBIVENT) 20-100 MCG/ACT AERS respimat Inhale 1 puff into the lungs every 6 (six) hours. 11/30/18   Palumbo, April, MD  lisinopril (ZESTRIL) 10 MG tablet Take 1 tablet (10 mg total) by mouth daily. 11/26/18   Daleen Bo, MD  potassium chloride SA (K-DUR) 20 MEQ tablet Take 1 tablet (20 mEq total) by mouth 2 (two) times daily. 11/30/18   Palumbo, April, MD    Physical Exam: Vitals:   12/25/18 1615 12/25/18 1630 12/25/18 1645 12/25/18 1952  BP: (!) 158/99 (!) 166/98 (!) 171/92 (!) 154/93  Pulse: (!) 103 (!) 103 (!) 104 (!) 105  Resp:    (!) 22  Temp:    99 F (37.2 C)  TempSrc:    Oral  SpO2: 91% 90% 96% (!) 85%   General: Not in acute distress HEENT:       Eyes: PERRL, EOMI, no scleral icterus.       ENT: No discharge from the ears and nose, no pharynx injection, no tonsillar enlargement.        Neck: positive JVD, no bruit, no mass felt. Heme: No neck lymph node enlargement. Cardiac: S1/S2, RRR, No murmurs, No gallops or rubs. Respiratory: No rales, wheezing, rhonchi or rubs. GI: Soft, nondistended, minimally difffused tenderness, no rebound pain, no organomegaly, BS present. GU: No hematuria Ext: 1+ pitting leg edema bilaterally.  Chronic venous insufficiency change 2+DP/PT pulse bilaterally. Musculoskeletal: No joint deformities, No joint redness or warmth, no limitation of ROM in spin. Skin: No rashes.  Neuro: Alert, oriented X3, cranial nerves II-XII grossly intact, moves all extremities normally.  Psych: Patient is not psychotic, no suicidal or hemocidal ideation.  Labs on Admission: I have personally reviewed following labs and imaging studies  CBC: Recent Labs  Lab 12/18/18 2332 12/20/18 1022 12/22/18 0630 12/22/18 1655 12/23/18 0223 12/25/18 1612  WBC 8.7 6.7 9.3  --  6.6 8.4  NEUTROABS  --  4.9  --   --  4.6 6.7  HGB 8.7* 8.3* 8.7* 10.5* 8.4* 8.3*  HCT  29.6* 28.9* 29.8* 31.0* 29.2* 28.6*  MCV 79.1* 80.5 79.3*  --  80.0 79.0*  PLT 227 203 273  --  251 323   Basic Metabolic Panel: Recent Labs  Lab 12/18/18 2332  12/22/18 0630 12/22/18 1655 12/23/18 0223 12/24/18 0311 12/25/18 0323 12/25/18 1612  NA 139   < > 140 142 139 139 139 138  K 4.2   < > 3.7 3.9 4.0 4.2 4.0 3.8  CL 102   < > 102  --  102 101 101 99  CO2 27   < > 30  --  29 30 29 29   GLUCOSE 258*   < > 187*  --  195* 183* 196* 231*  BUN 22*   < > 19  --  19 21* 20 16  CREATININE 1.07   < > 0.99  --  0.92 0.90 0.85 1.05  CALCIUM 8.7*   < >  8.8*  --  8.8* 8.8* 8.8* 8.6*  MG 2.1  --   --   --   --   --   --   --    < > = values in this interval not displayed.   GFR: Estimated Creatinine Clearance: 94.3 mL/min (by C-G formula based on SCr of 1.05 mg/dL). Liver Function Tests: Recent Labs  Lab 12/19/18 1115 12/25/18 1612  AST 26 22  ALT 18 15  ALKPHOS 198* 181*  BILITOT 0.8 0.6  PROT 7.5 6.8  ALBUMIN 2.9* 2.8*   No results for input(s): LIPASE, AMYLASE in the last 168 hours. No results for input(s): AMMONIA in the last 168 hours. Coagulation Profile: No results for input(s): INR, PROTIME in the last 168 hours. Cardiac Enzymes: Recent Labs  Lab 12/18/18 2332  TROPONINI 0.03*   BNP (last 3 results) No results for input(s): PROBNP in the last 8760 hours. HbA1C: No results for input(s): HGBA1C in the last 72 hours. CBG: Recent Labs  Lab 12/24/18 0618 12/24/18 1149 12/24/18 1545 12/24/18 2119 12/25/18 0611  GLUCAP 219* 174* 217* 207* 160*   Lipid Profile: No results for input(s): CHOL, HDL, LDLCALC, TRIG, CHOLHDL, LDLDIRECT in the last 72 hours. Thyroid Function Tests: No results for input(s): TSH, T4TOTAL, FREET4, T3FREE, THYROIDAB in the last 72 hours. Anemia Panel: No results for input(s): VITAMINB12, FOLATE, FERRITIN, TIBC, IRON, RETICCTPCT in the last 72 hours. Urine analysis:    Component Value Date/Time   COLORURINE YELLOW 12/18/2018 2353    APPEARANCEUR CLEAR 12/18/2018 2353   LABSPEC 1.016 12/18/2018 2353   PHURINE 7.0 12/18/2018 2353   GLUCOSEU NEGATIVE 12/18/2018 2353   HGBUR NEGATIVE 12/18/2018 2353   BILIRUBINUR NEGATIVE 12/18/2018 2353   KETONESUR NEGATIVE 12/18/2018 2353   PROTEINUR 100 (A) 12/18/2018 2353   UROBILINOGEN 1.0 03/14/2015 0610   NITRITE NEGATIVE 12/18/2018 2353   LEUKOCYTESUR NEGATIVE 12/18/2018 2353   Sepsis Labs: @LABRCNTIP (procalcitonin:4,lacticidven:4) ) Recent Results (from the past 240 hour(s))  SARS Coronavirus 2     Status: None   Collection Time: 12/16/18  4:25 AM  Result Value Ref Range Status   SARS Coronavirus 2 NOT DETECTED NOT DETECTED Final    Comment: (NOTE) SARS-CoV-2 target nucleic acids are NOT DETECTED. The SARS-CoV-2 RNA is generally detectable in upper and lower respiratory specimens during the acute phase of infection.  Negative  results do not preclude SARS-CoV-2 infection, do not rule out co-infections with other pathogens, and should not be used as the sole basis for treatment or other patient management decisions.  Negative results must be combined with clinical observations, patient history, and epidemiological information. The expected result is Not Detected. Fact Sheet for Patients: http://www.biofiredefense.com/wp-content/uploads/2020/03/BIOFIRE-COVID -19-patients.pdf Fact Sheet for Healthcare Providers: http://www.biofiredefense.com/wp-content/uploads/2020/03/BIOFIRE-COVID -19-hcp.pdf This test is not yet approved or cleared by the [place] FDA and  has been authorized for detection and/or diagnosis of SARS-CoV-2 by FDA under an Emergency Use Authorization (EUA).  This EUA will remain in effec t (meaning this test can be used) for the duration of  the COVID-19 declaration under Section 564(b)(1) of the Act, 21 U.S.C. section 360bbb-3(b)(1), unless the authorization is terminated or revoked sooner. Performed at [facility], [address]  [place], [place] [address]   SARS Coronavirus 2 (CEPHEID- Performed in [facility] hospital lab), Hosp Order     Status: None   Collection Time: 12/19/18  1:37 AM   Specimen: Nasopharyngeal Swab  Result Value Ref Range Status   SARS Coronavirus 2 NEGATIVE  NEGATIVE Final    Comment: (NOTE) If result is NEGATIVE SARS-CoV-2 target nucleic acids are NOT DETECTED. The SARS-CoV-2 RNA is generally detectable in upper and lower  respiratory specimens during the acute phase of infection. The lowest  concentration of SARS-CoV-2 viral copies this assay can detect is 250  copies / mL. A negative result does not preclude SARS-CoV-2 infection  and should not be used as the sole basis for treatment or other  patient management decisions.  A negative result may occur with  improper specimen collection / handling, submission of specimen other  than nasopharyngeal swab, presence of viral mutation(s) within the  areas targeted by this assay, and inadequate number of viral copies  (<250 copies / mL). A negative result must be combined with clinical  observations, patient history, and epidemiological information. If result is POSITIVE SARS-CoV-2 target nucleic acids are DETECTED. The SARS-CoV-2 RNA is generally detectable in upper and lower  respiratory specimens dur ing the acute phase of infection.  Positive  results are indicative of active infection with SARS-CoV-2.  Clinical  correlation with patient history and other diagnostic information is  necessary to determine patient infection status.  Positive results do  not rule out bacterial infection or co-infection with other viruses. If result is PRESUMPTIVE POSTIVE SARS-CoV-2 nucleic acids MAY BE PRESENT.   A presumptive positive result was obtained on the submitted specimen  and confirmed on repeat testing.  While 2019 novel coronavirus  (SARS-CoV-2) nucleic acids may be present in the submitted sample  additional confirmatory testing may be necessary  for epidemiological  and / or clinical management purposes  to differentiate between  SARS-CoV-2 and other Sarbecovirus currently known to infect humans.  If clinically indicated additional testing with an alternate test  methodology [phone]) is advised. The SARS-CoV-2 RNA is generally  detectable in upper and lower respiratory sp ecimens during the acute  phase of infection. The expected result is Negative. Fact Sheet for Patients:  BoilerBrush.com.cy Fact Sheet for Healthcare Providers: https://pope.com/ This test is not yet approved or cleared by the [place] FDA and has been authorized for detection and/or diagnosis of SARS-CoV-2 by FDA under an Emergency Use Authorization (EUA).  This EUA will remain in effect (meaning this test can be used) for the duration of the COVID-19 declaration under Section 564(b)(1) of the Act, 21 U.S.C. section 360bbb-3(b)(1), unless the authorization is terminated or revoked sooner. Performed at [facility], [address], [place], [place] [address]   SARS Coronavirus 2 (CEPHEID - Performed in [facility] hospital lab), Hosp Order     Status: None   Collection Time: 12/22/18  8:32 AM   Specimen: Nasopharyngeal Swab  Result Value Ref Range Status   SARS Coronavirus 2 NEGATIVE NEGATIVE Final    Comment: (NOTE) If result is NEGATIVE SARS-CoV-2 target nucleic acids are NOT DETECTED. The SARS-CoV-2 RNA is generally detectable in upper and lower  respiratory specimens during the acute phase of infection. The lowest  concentration of SARS-CoV-2 viral copies this assay can detect is 250  copies / mL. A negative result does not preclude SARS-CoV-2 infection  and should not be used as the sole basis for treatment or other  patient management decisions.  A negative result may occur with  improper specimen collection / handling, submission of specimen other  than nasopharyngeal swab, presence of  viral mutation(s) within the  areas targeted by this assay, and inadequate number of viral copies  (<250 copies / mL). A negative  result must be combined with clinical  observations, patient history, and epidemiological information. If result is POSITIVE SARS-CoV-2 target nucleic acids are DETECTED. The SARS-CoV-2 RNA is generally detectable in upper and lower  respiratory specimens dur ing the acute phase of infection.  Positive  results are indicative of active infection with SARS-CoV-2.  Clinical  correlation with patient history and other diagnostic information is  necessary to determine patient infection status.  Positive results do  not rule out bacterial infection or co-infection with other viruses. If result is PRESUMPTIVE POSTIVE SARS-CoV-2 nucleic acids MAY BE PRESENT.   A presumptive positive result was obtained on the submitted specimen  and confirmed on repeat testing.  While 2019 novel coronavirus  (SARS-CoV-2) nucleic acids may be present in the submitted sample  additional confirmatory testing may be necessary for epidemiological  and / or clinical management purposes  to differentiate between  SARS-CoV-2 and other Sarbecovirus currently known to infect humans.  If clinically indicated additional testing with an alternate test  methodology [phone]) is advised. The SARS-CoV-2 RNA is generally  detectable in upper and lower respiratory sp ecimens during the acute  phase of infection. The expected result is Negative. Fact Sheet for Patients:  BoilerBrush.com.cy Fact Sheet for Healthcare Providers: https://pope.com/ This test is not yet approved or cleared by the [place] FDA and has been authorized for detection and/or diagnosis of SARS-CoV-2 by FDA under an Emergency Use Authorization (EUA).  This EUA will remain in effect (meaning this test can be used) for the duration of the COVID-19 declaration under Section  564(b)(1) of the Act, 21 U.S.C. section 360bbb-3(b)(1), unless the authorization is terminated or revoked sooner. Performed at [facility], [address] [place], [place] [address]   Surgical PCR screen     Status: None   Collection Time: 12/22/18  6:57 PM   Specimen: Nasal Mucosa; Nasal Swab  Result Value Ref Range Status   MRSA, PCR NEGATIVE NEGATIVE Final   Staphylococcus aureus NEGATIVE NEGATIVE Final    Comment: (NOTE) The Xpert SA Assay (FDA approved for NASAL specimens in patients 85 years of age and older), is one component of a comprehensive surveillance program. It is not intended to diagnose infection nor to guide or monitor treatment. Performed at [facility], [address] [place], [place] [address]      Radiological Exams on Admission: Dg Chest Port 1 View  Result Date: 12/25/2018 CLINICAL DATA:  58 year old male with shortness of breath EXAM: PORTABLE CHEST 1 VIEW COMPARISON:  Chest radiograph dated 12/22/2018 FINDINGS: There is cardiomegaly with vascular congestion and probable small bilateral pleural effusions. There are bibasilar atelectasis. Pneumonia is not excluded. There is no pneumothorax. No acute osseous pathology. Stable small radiopaque foreign object over the right diaphragm. IMPRESSION: Cardiomegaly with findings of CHF and small bilateral pleural effusions, slightly worsened since the prior radiograph. Pneumonia is not excluded. Electronically Signed   By: Elgie Collard M.D.   On: 12/25/2018 19:44     EKG: Reviewed independently, sinus rhythm, QTC 474, LAE, low voltage, poor R wave progression, mild T wave inversion in V4-V6.  Assessment/Plan Principal Problem:   Acute on chronic systolic (congestive) heart failure (HCC) Active Problems:   Essential hypertension, benign   Cocaine use disorder, severe, dependence (HCC)   Chronic paranoid schizophrenia (HCC)   Homelessness   Smoker   Diabetes mellitus without complication (HCC)    Abdominal pain   Anemia   Acute on chronic respiratory failure with  hypoxia (HCC)   Mobitz type II atrioventricular block   Acute respiratory failure with hypoxia due to acute on chronic systolic CHF: Patient has elevated BNP 989, positive JVD and bilateral leg edema.  Chest x-ray showed pulmonary edema, consistent with CHF exacerbation.  Patient had 2D echo on 11/07/2018 which showed EF of 25-30%.  Will not repeat 2D echo today. Pt is homeless. He does not seem to be able to take care of himself medically.  Will need placement.  -will place on tele bed for obs -will admit to tele bed as inpt. -Lasix 40 mg bid by IV (pt received 60 mg Lasix in ED) -Daily weights -strict I/O's -Low salt diet -Fluid restriction -will consult  SW for SNF placement  HTN:  -Continue home medications: lisinopril -Patient is on IV Lasix -IV hydralazine prn  Cocaine use disorder, severe, dependence and  Tobacco use: -Did counseling about importance of quitting substance use - Nicotine patch  Chronic paranoid schizophrenia and MDD (major depressive disorder), severe (HCC): No SI or HI. -continue home Abilify and Tegretol  Diabetes mellitus without complication (HCC): Last A1c 9.7 on 12/04/18, poorly controled. Patient is taking metformin at home -SSI  Abdominal pain, Nausea vomiting: this seems to be a chronic issue. He had same complaint in previous admission. Patient has minimal abdominal tenderness on examination.  Electrolytes okay. -Supportive care -PRN Zofran for nausea -check lipase -try protonic  Anemia: 8.4 on 12/19/18-->8.3 today -f/u by CBC  Mobitz type II atrioventricular block: pt was found to have Mobitz type II atrioventricular block. Per discharge summner, " cardiology following the patient and again discussed with EP.Situation is difficult due to his comorbidities, non-compliance and homelessness issue.  Given his nonadherence and currently being asymptomatic and narrow QRS he  was not a good candidate for BiVICD.  Also given the fact that he is noncompliant with treatment and frequently signing out if he is going to make things even more difficult for him to get any intervention in future". -avoid BB or other node blokers -tele monitoring    DVT ppx:  SQ Lovenox Code Status: Full code Family Communication: None at bed side.     Disposition Plan:  To be determined Consults called:  none Admission status: Obs / tele     Date of Service 12/25/2018    Lorretta HarpXilin Myrick Mcnairy [facility]   If 7PM-7AM, please contact night-coverage [email or web address] Password [email or web address] 12/25/2018, 8:52 PM

## 2018-12-25 NOTE — Plan of Care (Signed)

## 2018-12-25 NOTE — ED Provider Notes (Addendum)
[facility] Provider Note   CSN: [id number] Arrival date & time: 12/25/18  1527    History   Chief Complaint Chief Complaint  Patient presents with  . Shortness of Breath    HPI [name] is a 58 y.o. male.     HPI   Patient presents the same day as leaving AGAINST MEDICAL ADVICE from medical unit, now with concern for ongoing dyspnea, and his homeless status. Patient has multiple medical issues, states that he left due to frustration with social work, not finding him assistance with his housing situation. He notes that he has had weight loss since being admitted a few days ago, has had improved dyspnea, but he does have mild concern for persistency of shortness of breath. No complaints of new pain, discomfort. Patient offers a rambling account of the rest of his presentation, beyond frustration cannot specify why he left AGAINST MEDICAL ADVICE, cannot specify if he has been taking any medication as directed He has a known history of schizophrenia as well as substance abuse, which likely complicate the HPI.  Past Medical History:  Diagnosis Date  . Chronic foot pain   . Cocaine abuse (HCC)   . Diabetes mellitus without complication (HCC)   . Hepatitis C    unsure   . Homelessness   . Hypertension   . Neuropathy   . Polysubstance abuse (HCC)   . Schizophrenia (HCC)   . Sleep apnea   . Systolic and diastolic CHF, chronic [facility](HCC)     Patient Active Problem List   Diagnosis Date Noted  . Acute exacerbation of CHF (congestive heart failure) (HCC) 12/22/2018  . Acute on chronic respiratory failure with hypoxia and hypercapnia (HCC) 12/22/2018  . CHF exacerbation (HCC) 12/19/2018  . Acute CHF (congestive heart failure) (HCC) 12/16/2018  . Diabetes mellitus without complication (HCC) 12/11/2018  . Acute on chronic systolic (congestive) heart failure (HCC) 12/11/2018  . Abdominal pain 12/11/2018  . Nausea vomiting and  diarrhea 12/11/2018  . Anemia 12/11/2018  . Evaluation by psychiatric service required   . MDD (major depressive disorder), severe (HCC) 11/25/2018  . Pressure injury of skin 11/08/2018  . Elevated troponin 10/18/2018  . HLD (hyperlipidemia) 10/18/2018  . Anxiety 10/18/2018  . CHF (congestive heart failure), NYHA class II, acute on chronic, combined (HCC) 10/18/2018  . Hypertensive urgency 10/12/2018  . Mild renal insufficiency 10/12/2018  . Cellulitis 10/12/2018  . Penile cellulitis   . Adjustment disorder with mixed disturbance of emotions and conduct   . Sleep apnea 10/03/2018  . Hypokalemia 09/17/2018  . Scrotal swelling 09/17/2018  . Urinary hesitancy   . Constipation   . Acute on chronic combined systolic and diastolic CHF (congestive heart failure) (HCC) 08/25/2018  . Tobacco use 08/18/2018  . Homelessness 08/08/2018  . Smoker 08/08/2018  . Prostate enlargement 03/16/2018  . Aortic atherosclerosis (HCC) 03/16/2018  . 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 Chronic foot pain   . Schizoaffective disorder, bipolar type (HCC) 09/30/2016  . Substance induced mood disorder (HCC) 03/13/2015  . Schizophrenia, paranoid type (HCC) 01/17/2015  . Drug hallucinosis (HCC) 10/08/2014  . Chronic paranoid schizophrenia (HCC) 09/07/2014  . 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 Urinary retention   . Cocaine use disorder, severe, dependence (HCC)   . Essential hypertension, benign 03/28/2013  . Insulin-requiring or dependent type II diabetes mellitus (HCC) 03/15/2013    Past Surgical History:  Procedure  Laterality Date  . MULTIPLE TOOTH EXTRACTIONS          Home Medications    Prior to Admission medications   Medication Sig Start Date End Date Taking? Authorizing Provider  ARIPiprazole (ABILIFY) 10 MG tablet Take 1 tablet (10 mg total) by mouth daily. 12/10/18   Pokhrel, Rebekah ChesterfieldLaxman, MD  carbamazepine  (TEGRETOL) 200 MG tablet Take 0.5 tablets (100 mg total) by mouth 2 (two) times daily. 11/26/18   Mancel BaleWentz, Elliott, MD  furosemide (LASIX) 40 MG tablet Take 1 tablet (40 mg total) by mouth 2 (two) times daily for 30 days. 11/26/18 12/26/18  Mancel BaleWentz, Elliott, MD  glipiZIDE (GLUCOTROL) 5 MG tablet Take 0.5 tablets (2.5 mg total) by mouth 2 (two) times daily before a meal. Patient not taking: Reported on 12/19/2018 12/18/18   Alwyn RenMathews, Elizabeth G, MD  Ipratropium-Albuterol (COMBIVENT) 20-100 MCG/ACT AERS respimat Inhale 1 puff into the lungs every 6 (six) hours. 11/30/18   Palumbo, April, MD  lisinopril (ZESTRIL) 10 MG tablet Take 1 tablet (10 mg total) by mouth daily. 11/26/18   Mancel BaleWentz, Elliott, MD  potassium chloride SA (K-DUR) 20 MEQ tablet Take 1 tablet (20 mEq total) by mouth 2 (two) times daily. 11/30/18   Palumbo, April, MD    Family History Family History  Problem Relation Age of Onset  . Hypertension Other   . Diabetes Other     Social History Social History   Tobacco Use  . Smoking status: Current Every Day Smoker    Packs/day: 1.00    Years: 20.00    Pack years: 20.00    Types: Cigarettes  . Smokeless tobacco: Current User  Substance Use Topics  . Alcohol use: Yes    Comment: Daily Drinker   . Drug use: Yes    Frequency: 7.0 times per week    Types: "Crack" cocaine, Cocaine, Marijuana    Comment: used cocaine 11-27-18     Allergies   Haldol [haloperidol]   Review of Systems Review of Systems  Unable to perform ROS: Psychiatric disorder     Physical Exam Updated Vital Signs BP (!) 171/92   Pulse (!) 104   Temp 98 F (36.7 C) (Oral)   Resp (!) 119   SpO2 96%   Physical Exam Vitals signs reviewed.  Constitutional:      General: He is not in acute distress.    Appearance: He is well-developed. He is not diaphoretic.  HENT:     Head: Normocephalic and atraumatic.     Nose: Nose normal.  Eyes:     General: No scleral icterus.       Right eye: No discharge.         Left eye: No discharge.     Conjunctiva/sclera: Conjunctivae normal.     Pupils: Pupils are equal, round, and reactive to light.  Neck:     Musculoskeletal: Normal range of motion and neck supple.  Cardiovascular:     Rate and Rhythm: Normal rate and regular rhythm.     Heart sounds: No murmur. No friction rub. No gallop.      Comments: Edema up to the scrotum and lower abdomen Pulmonary:     Effort: Pulmonary effort is normal. No respiratory distress.     Breath sounds: Normal breath sounds. No stridor. No rales.  Abdominal:     General: There is no distension.     Palpations: Abdomen is soft.     Tenderness: There is no abdominal tenderness.  Genitourinary:  Penis: Swelling present. No erythema, tenderness or discharge.   Musculoskeletal:        General: No tenderness.     Right lower leg: 2+ Pitting Edema present.     Left lower leg: 2+ Pitting Edema present.     Comments: Diminished edema since last time I saw the patient which was a few weeks ago.  Skin:    General: Skin is warm and dry.     Findings: No erythema or rash.  Neurological:     Mental Status: He is alert and oriented to person, place, and time.  Psychiatric:     Comments: Little insight into his current presentation, or recent hospitalization.      ED Treatments / Results  Labs (all labs ordered are listed, but only abnormal results are displayed) Labs Reviewed  COMPREHENSIVE METABOLIC PANEL - Abnormal; Notable for the following components:      Result Value   Glucose, Bld 231 (*)    Calcium 8.6 (*)    Albumin 2.8 (*)    Alkaline Phosphatase 181 (*)    All other components within normal limits  CBC WITH DIFFERENTIAL/PLATELET - Abnormal; Notable for the following components:   RBC 3.62 (*)    Hemoglobin 8.3 (*)    HCT 28.6 (*)    MCV 79.0 (*)    MCH 22.9 (*)    MCHC 29.0 (*)    RDW 20.9 (*)    All other components within normal limits  BRAIN NATRIURETIC PEPTIDE - Abnormal; Notable for the  following components:   B Natriuretic Peptide 989.4 (*)    All other components within normal limits  ETHANOL    Radiology Dg Chest Port 1 View  Result Date: 12/25/2018 CLINICAL DATA:  58 year old male with shortness of breath EXAM: PORTABLE CHEST 1 VIEW COMPARISON:  Chest radiograph dated 12/22/2018 FINDINGS: There is cardiomegaly with vascular congestion and probable small bilateral pleural effusions. There are bibasilar atelectasis. Pneumonia is not excluded. There is no pneumothorax. No acute osseous pathology. Stable small radiopaque foreign object over the right diaphragm. IMPRESSION: Cardiomegaly with findings of CHF and small bilateral pleural effusions, slightly worsened since the prior radiograph. Pneumonia is not excluded. Electronically Signed   By: Elgie CollardArash  Radparvar M.D.   On: 12/25/2018 19:44    Procedures Procedures (including critical care time)  Medications Ordered in ED Medications - No data to display   Initial Impression / Assessment and Plan / ED Course  I have reviewed the triage vital signs and the nursing notes.  Pertinent labs & imaging results that were available during my care of the patient were reviewed by me and considered in my medical decision making (see chart for details).    From initial evaluation I reviewed the patient's chart including documentation from recent hospitalization. Patient has 5586 emergency department visits in the past 6 months, has been seen and evaluated by inpatient team, and social work multiple times. Patient known to leave AGAINST MEDICAL ADVICE with some frequency.  Initial labs notable for diminished BNP compared to recent admission value, and given the patient's endorsement of losing weight, with less edema, there is encouragement the patient's CHF is improved.     7:51 PM Have discussed the patient's case with our social work Acupuncturistcolleagues.  He remains hemodynamically unremarkable but requires o2 to improve from 84%>96%  This patient with multiple medical issues, multiple recent ED and hospital visits now presents after leaving earlier today following an episode frustration while on the medical floor.  Here the patient is found to have persistent hypoxia, that improved with supplemental oxygen. Suspicion for worsening heart failure, though slightly improved from recent admission, but given his oxygen dependence he was admitted for further monitoring, management. I discussed several times with the patient the importance of staying, should he be admitted, and the patient reiterates several times that he is willing to do so.  Final Clinical Impressions(s) / ED Diagnoses  Shortness of breath Homelessness   Carmin Muskrat, MD 12/25/18 Leonides Schanz    Carmin Muskrat, MD 12/25/18 2003

## 2018-12-25 NOTE — Progress Notes (Addendum)
Pt signed AMA form, Dr Clementeen Graham informed, advise given to patient to wait for social worker , and that he's not to  leave if he wants to be better.Pt state  he's leaving. Walked off unit with his walker and baggage.

## 2018-12-25 NOTE — Progress Notes (Signed)
Patient arrived to unit, RN explained plan of care and restrictions. Will continue to monitor. )2 placed via nasal cannula.

## 2018-12-25 NOTE — ED Notes (Signed)
ED TO INPATIENT HANDOFF REPORT  ED Nurse Name and Phone #: Ozias Dicenzo [phone]  S Name/Age/Gender [name] 58 y.o. male Room/Bed: 045C/045C  Code Status   Code Status: Prior  Home/SNF/Other Home Patient oriented to: self, place, time and situation Is this baseline? Yes   Triage Complete: Triage complete  Chief Complaint Abd Pain; SOB  Triage Note Patient arrived via PTAR with reports shortness of breath, he was reported to have left AMA  today from 2 central.    Allergies Allergies  Allergen Reactions  . Haldol [Haloperidol] Other (See Comments)    Muscle spasms, loss of voluntary movement. However, pt has taken Thorazine on multiple occasions with no adverse effects.     Level of Care/Admitting Diagnosis ED Disposition    ED Disposition Condition Comment   Admit  Hospital Area: [place] [[id number]]  Level of Care: Telemetry Cardiac [103]  I expect the patient will be discharged within 24 hours: No (not a candidate for 5C-Observation unit)  Covid Evaluation: Screening Protocol (No Symptoms)  Diagnosis: Acute on chronic systolic (congestive) heart failure [facility]) [[id number]]  Admitting Physician: Ivor Costa [[id number]]  Attending Physician: Ivor Costa [[id number]]  PT Class (Do Not Modify): Observation [104]  PT Acc Code (Do Not Modify): Observation [10022]       B Medical/Surgery History Past Medical History:  Diagnosis Date  . Chronic foot pain   . Cocaine abuse ([place])   . Diabetes mellitus without complication ([place])   . Hepatitis C    unsure   . Homelessness   . Hypertension   . Neuropathy   . Polysubstance abuse ([place])   . Schizophrenia ([place])   . Sleep apnea   . Systolic and diastolic CHF, chronic ([place])    Past Surgical History:  Procedure Laterality Date  . MULTIPLE TOOTH EXTRACTIONS       A IV Location/Drains/Wounds Patient Lines/Drains/Airways Status   Active Line/Drains/Airways    Name:   Placement date:   Placement time:    Site:   Days:   Peripheral IV 12/25/18   12/25/18    1619    -   less than 1   Pressure Injury 11/07/18 Stage II -  Partial thickness loss of dermis presenting as a shallow open ulcer with a red, pink wound bed without slough. small dime sized area   11/07/18    1532     48   Pressure Injury 11/07/18 Stage II -  Partial thickness loss of dermis presenting as a shallow open ulcer with a red, pink wound bed without slough. small dime sized area   11/07/18    1535     48          Intake/Output Last 24 hours No intake or output data in the 24 hours ending 12/25/18 2053  Labs/Imaging Results for orders placed or performed during the hospital encounter of 12/25/18 (from the past 48 hour(s))  Comprehensive metabolic panel     Status: Abnormal   Collection Time: 12/25/18  4:12 PM  Result Value Ref Range   Sodium 138 135 - 145 mmol/L   Potassium 3.8 3.5 - 5.1 mmol/L   Chloride 99 98 - 111 mmol/L   CO2 29 22 - 32 mmol/L   Glucose, Bld 231 (H) 70 - 99 mg/dL   BUN 16 6 - 20 mg/dL   Creatinine, Ser 1.05 0.61 - 1.24 mg/dL   Calcium 8.6 (L) 8.9 - 10.3 mg/dL   Total Protein 6.8 6.5 -  8.1 g/dL   Albumin 2.8 (L) 3.5 - 5.0 g/dL   AST 22 15 - 41 U/L   ALT 15 0 - 44 U/L   Alkaline Phosphatase 181 (H) 38 - 126 U/L   Total Bilirubin 0.6 0.3 - 1.2 mg/dL   GFR calc non Af Amer >60 >60 mL/min   GFR calc Af Amer >60 >60 mL/min   Anion gap 10 5 - 15    Comment: Performed at [facility], [address] [place], [place] [address]  Ethanol     Status: None   Collection Time: 12/25/18  4:12 PM  Result Value Ref Range   Alcohol, Ethyl (B) <10 <10 mg/dL    Comment: (NOTE) Lowest detectable limit for serum alcohol is 10 mg/dL. For medical purposes only. Performed at [facility], [address] [place], [place] [address]   CBC with Differential     Status: Abnormal   Collection Time: 12/25/18  4:12 PM  Result Value Ref Range   WBC 8.4 4.0 - 10.5 K/uL   RBC 3.62 (L) 4.22 - 5.81 MIL/uL    Hemoglobin 8.3 (L) 13.0 - 17.0 g/dL   HCT 09.828.6 (L) 11.939.0 - 14.752.0 %   MCV 79.0 (L) 80.0 - 100.0 fL   MCH 22.9 (L) 26.0 - 34.0 pg   MCHC 29.0 (L) 30.0 - 36.0 g/dL   RDW 82.920.9 (H) 56.211.5 - 13.015.5 %   Platelets 296 150 - 400 K/uL   nRBC 0.0 0.0 - 0.2 %   Neutrophils Relative % 80 %   Neutro Abs 6.7 1.7 - 7.7 K/uL   Lymphocytes Relative 8 %   Lymphs Abs 0.7 0.7 - 4.0 K/uL   Monocytes Relative 9 %   Monocytes Absolute 0.8 0.1 - 1.0 K/uL   Eosinophils Relative 2 %   Eosinophils Absolute 0.2 0.0 - 0.5 K/uL   Basophils Relative 1 %   Basophils Absolute 0.0 0.0 - 0.1 K/uL   Immature Granulocytes 0 %   Abs Immature Granulocytes 0.02 0.00 - 0.07 K/uL    Comment: Performed at [facility], [address] [place], [place] [address]  Brain natriuretic peptide     Status: Abnormal   Collection Time: 12/25/18  4:12 PM  Result Value Ref Range   B Natriuretic Peptide 989.4 (H) 0.0 - 100.0 pg/mL    Comment: Performed at [facility], [address] [place], [place] [address]   Dg Chest Port 1 View  Result Date: 12/25/2018 CLINICAL DATA:  58 year old male with shortness of breath EXAM: PORTABLE CHEST 1 VIEW COMPARISON:  Chest radiograph dated 12/22/2018 FINDINGS: There is cardiomegaly with vascular congestion and probable small bilateral pleural effusions. There are bibasilar atelectasis. Pneumonia is not excluded. There is no pneumothorax. No acute osseous pathology. Stable small radiopaque foreign object over the right diaphragm. IMPRESSION: Cardiomegaly with findings of CHF and small bilateral pleural effusions, slightly worsened since the prior radiograph. Pneumonia is not excluded. Electronically Signed   By: Elgie CollardArash  Radparvar M.D.   On: 12/25/2018 19:44    Pending Labs Unresulted Labs (From admission, onward)    Start     Ordered   12/25/18 2011  SARS Coronavirus 2 (CEPHEID - Performed in [facility] hospital lab), Hosp Order  (Asymptomatic Patients Labs)  Once,   STAT    Question:  Rule  Out  Answer:  Yes   12/25/18 2010   Signed and Held  Lipase, blood  Once,  R     Signed and Held   Signed and Held  Basic metabolic panel  Daily,   R     Signed and Held          Vitals/Pain Today's Vitals   12/25/18 1615 12/25/18 1630 12/25/18 1645 12/25/18 1952  BP: (!) 158/99 (!) 166/98 (!) 171/92 (!) 154/93  Pulse: (!) 103 (!) 103 (!) 104 (!) 105  Resp:    (!) 22  Temp:    99 F (37.2 C)  TempSrc:    Oral  SpO2: 91% 90% 96% (!) 85%  PainSc:        Isolation Precautions No active isolations  Medications Medications  pantoprazole (PROTONIX) EC tablet 40 mg (has no administration in time range)  furosemide (LASIX) injection 40 mg (has no administration in time range)  hydrALAZINE (APRESOLINE) injection 5 mg (has no administration in time range)  furosemide (LASIX) injection 60 mg (60 mg Intravenous Given 12/25/18 2010)    Mobility walks Low fall risk   Focused Assessments Pulmonary Assessment Handoff:  Lung sounds: Bilateral Breath Sounds: Expiratory wheezes O2 Device: Room Air O2 Flow Rate (L/min): 2 L/min      R Recommendations: See Admitting Provider Note  Report given to:   Additional Notes:
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
## 2018-12-25 NOTE — Discharge Instructions (Signed)
Please use the previously provided resources for assistance with homelessness if desired.

## 2018-12-25 NOTE — Progress Notes (Addendum)
Progress Note  Patient Name: Taylor MemosFrederick L Smallman Date of Encounter: 12/25/2018  Primary Cardiologist: Kristeen MissPhilip Nahser, MD   Subjective   Pt asleep on my rounds. He grunts yes when asked if he slept well. Appears comfortable, still quite volume overloaded.   Inpatient Medications    Scheduled Meds:  ARIPiprazole  10 mg Oral Daily   carbamazepine  100 mg Oral BID   enoxaparin (LOVENOX) injection  40 mg Subcutaneous Q24H   furosemide  60 mg Intravenous BID   insulin aspart  0-15 Units Subcutaneous TID WC   insulin aspart  0-5 Units Subcutaneous QHS   insulin glargine  12 Units Subcutaneous QHS   lisinopril  10 mg Oral Daily   mupirocin ointment  1 application Nasal BID   potassium chloride SA  20 mEq Oral BID   sodium chloride flush  3 mL Intravenous Q12H   Continuous Infusions:  sodium chloride     PRN Meds: sodium chloride, acetaminophen, alum & mag hydroxide-simeth, ipratropium-albuterol, ondansetron (ZOFRAN) IV, sodium chloride flush   Vital Signs    Vitals:   12/24/18 1513 12/24/18 1959 12/24/18 2357 12/25/18 0434  BP: (!) 162/95 (!) 159/103 (!) 155/95 (!) 160/97  Pulse: 91     Resp:      Temp: 97.7 F (36.5 C) 98 F (36.7 C) 98.2 F (36.8 C) 98.5 F (36.9 C)  TempSrc: Oral Oral  Oral  SpO2: 94%     Weight:    108.6 kg  Height:        Intake/Output Summary (Last 24 hours) at 12/25/2018 0740 Last data filed at 12/25/2018 0029 Gross per 24 hour  Intake 1582 ml  Output 2700 ml  Net -1118 ml   Last 3 Weights 12/25/2018 12/24/2018 12/23/2018  Weight (lbs) 239 lb 6.7 oz 244 lb 11.4 oz 244 lb 3.2 oz  Weight (kg) 108.6 kg 111 kg 110.768 kg  Some encounter information is confidential and restricted. Go to Review Flowsheets activity to see all data.      Telemetry    SR 90's. Only a few episodes of AVB.  - Personally Reviewed  ECG    No new tracings - Personally Reviewed  Physical Exam   GEN: No acute distress.   Neck: + JVD Cardiac: RRR, no  murmurs, rubs, or gallops.  Respiratory: Crackles in lower half GI: Firm with fluid, lower half MS: Tight edema of legs up to lower abdomen Neuro:  Nonfocal Psych: pt sleeping, groggy when aroused  Labs    High Sensitivity Troponin:  No results for input(s): TROPONINIHS in the last 720 hours.    Cardiac Enzymes Recent Labs  Lab 12/18/18 2332  TROPONINI 0.03*   No results for input(s): TROPIPOC in the last 168 hours.   Chemistry Recent Labs  Lab 12/19/18 1115  12/23/18 0223 12/24/18 0311 12/25/18 0323  NA 141   < > 139 139 139  K 4.1   < > 4.0 4.2 4.0  CL 101   < > 102 101 101  CO2 30   < > 29 30 29   GLUCOSE 256*   < > 195* 183* 196*  BUN 21*   < > 19 21* 20  CREATININE 0.98   < > 0.92 0.90 0.85  CALCIUM 9.0   < > 8.8* 8.8* 8.8*  PROT 7.5  --   --   --   --   ALBUMIN 2.9*  --   --   --   --   AST  26  --   --   --   --   ALT 18  --   --   --   --   ALKPHOS 198*  --   --   --   --   BILITOT 0.8  --   --   --   --   GFRNONAA >60   < > >60 >60 >60  GFRAA >60   < > >60 >60 >60  ANIONGAP 10   < > 8 8 9    < > = values in this interval not displayed.     Hematology Recent Labs  Lab 12/20/18 1022 12/22/18 0630 12/22/18 1655 12/23/18 0223  WBC 6.7 9.3  --  6.6  RBC 3.59* 3.76*  --  3.65*  HGB 8.3* 8.7* 10.5* 8.4*  HCT 28.9* 29.8* 31.0* 29.2*  MCV 80.5 79.3*  --  80.0  MCH 23.1* 23.1*  --  23.0*  MCHC 28.7* 29.2*  --  28.8*  RDW 21.1* 21.0*  --  20.8*  PLT 203 273  --  251    BNP Recent Labs  Lab 12/18/18 2350 12/22/18 0750  BNP 974.0* 2,774.1*     DDimer No results for input(s): DDIMER in the last 168 hours.   Radiology    No results found.  Cardiac Studies   Echocardiogram 11/07/2018: Impressions: 1. The left ventricle has severely reduced systolic function, with an ejection fraction of 25-30%. The cavity size was mildly dilated. There is moderate concentric left ventricular hypertrophy. Left ventricular diastolic Doppler parameters are    consistent with restrictive filling. Elevated left atrial and left ventricular end-diastolic pressures There is right ventricular volume and pressure overload. Left ventricular diffuse hypokinesis. 2. The right ventricle has severely reduced systolic function. The cavity was moderately enlarged. There is no increase in right ventricular wall thickness. Right ventricular systolic pressure is moderately elevated with an estimated pressure of 51.2  mmHg. 3. Left atrial size was moderately dilated. 4. Right atrial size was severely dilated. 5. Small pericardial effusion. 6. The pericardial effusion is posterior to the left ventricle. 7. Mild thickening of the mitral valve leaflet. No evidence of mitral valve stenosis. 8. The tricuspid valve is grossly normal. Tricuspid valve regurgitation is moderate. 9. The aortic valve is tricuspid. Mild thickening of the aortic valve. Mild calcification of the aortic valve. Aortic valve regurgitation is mild by color flow Doppler. No stenosis of the aortic valve. 10. The inferior vena cava was dilated in size with <50% respiratory variability.  Summary: There is no significant difference since the prior study on 09/01/2017, there is severe biventricular failure, LVEDP is severely elevated.    Patient Profile     58 y.o. male with a hx of chronic systolic heart failure EF 25-30%, type 2 DM, HTN, schizophrenia, polysubstance abuse, histoty of HCV, OSA, morbid obesity and being homelesswho is being seen for the evaluation of bradycardia and heart failure.   Assessment & Plan    Acute on chronic systolic heart failure -Diuresing with lasix 60 mg IV BID with good urine output, 2.7L yesterday. Net -0.7L fluid balance. Per staff pt struggles to limit oral fluid intake.  -Wt is down 5 lbs from yesterday, 16 lbs overall. Still has significant volume overload with anasarca and LE edema up to abdomen.  -Renal function remains good with SCr 0.85. K+ 4.0.   -Could consider getting more aggressive with diuresis, lasix 60 mg TID  AV block, asymptomatic -pt with intermittent high grade AV block.  On review of tele, seems to be fewer episodes each day.  -Avoid AV nodal blocking agent.  -Difficult situation with pt being homeless and non-compliant. On last admission seen by Dr. Ladona Ridgelaylor and felt not to be good candidate for pacemaker. Will see if Dr. Eldridge DaceVaranasi talked to EP this admission.  --Dr. Eldridge DaceVaranasi discussed case again with EP: Narrow QRS.  Not a good candidate for BiV ICD.  Unclear whether he had sx from the heart block.  COntinue to avoid rate slowing drugs.  Needs to be compliant with diuretics to avoid furtehr heart failure.  Acute respiratory failure -Now on room air.  Hypertension -On ACE-I -BP is elevated 150's-160's-90's-103.  -We can increase his lisinopril dose for better BP control, however, pt is not likely to continue it after discharge based on his past history. Spironolactone would be a good med for him considering reduced EF and anasarca, however, with medication non-compliance and pt not likely to follow up with outpatient labs to follow renal function and potassium, would not add at this time.   Social issues -Pt reports homeless and uses crack as much as he can, he says to cope with homelessness.  -[facility] has tried to help him many times in the past and efforts have not worked out. He does not qualify for SNF, which is what he wants. Difficult situation with no good options.       For questions or updates, please contact [facility] Please consult [email or web address] for contact info under        Signed, Berton BonJanine Hammond, NP  12/25/2018, 7:40 AM    Pt left AMA before I came to the room.  This is part of the reason why despite his heart block, he is not A good candidate for pacemaker.  Corky CraftsVaranasi, Zaydn Gutridge S, MD

## 2018-12-25 NOTE — ED Triage Notes (Signed)
Patient arrived via [facility] with reports shortness of breath, he was reported to have left AMA  today from 2 central.

## 2018-12-25 NOTE — Discharge Summary (Signed)
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 the sole basis for treatment or other  patient management decisions.  A negative result may occur with  improper specimen collection / handling, submission of specimen other  than nasopharyngeal swab, presence of viral mutation(s) within the  areas targeted by this assay, and inadequate number of viral copies  (<250 copies / mL). A negative result must be combined with clinical  observations, patient history, and epidemiological information. If result is POSITIVE SARS-CoV-2 target nucleic acids are DETECTED. The SARS-CoV-2 RNA is generally detectable in upper and lower  respiratory  specimens dur ing the acute phase of infection.  Positive  results are  indicative of active infection with SARS-CoV-2.  Clinical  correlation with patient history and other diagnostic information is  necessary to determine patient infection status.  Positive results do  not rule out bacterial infection or co-infection with other viruses. If result is PRESUMPTIVE POSTIVE SARS-CoV-2 nucleic acids MAY BE PRESENT.   A presumptive positive result was obtained on the submitted specimen  and confirmed on repeat testing.  While 2019 novel coronavirus  (SARS-CoV-2) nucleic acids may be present in the submitted sample  additional confirmatory testing may be necessary for epidemiological  and / or clinical management purposes  to differentiate between  SARS-CoV-2 and other Sarbecovirus currently known to infect humans.  If clinically indicated additional testing with an alternate test  methodology [phone]) is advised. The SARS-CoV-2 RNA is generally  detectable in upper and lower respiratory sp ecimens during the acute  phase of infection. The expected result is Negative. Fact Sheet for Patients:  BoilerBrush.com.cy Fact Sheet for Healthcare Providers: https://pope.com/ This test is not yet approved or cleared by the [place] FDA and has been authorized for detection and/or diagnosis of SARS-CoV-2 by FDA under an Emergency Use Authorization (EUA).  This EUA will remain in effect (meaning this test can be used) for the duration of the COVID-19 declaration under Section 564(b)(1) of the Act, 21 U.S.C. section 360bbb-3(b)(1), unless the authorization is terminated or revoked sooner. Performed at [facility], [address], [place], [place] [address]   SARS Coronavirus 2 (CEPHEID - Performed in [facility] hospital lab), Hosp Order     Status: None   Collection Time: 12/22/18  8:32 AM   Specimen: Nasopharyngeal Swab  Result Value Ref Range Status   SARS Coronavirus 2 NEGATIVE NEGATIVE Final    Comment:  (NOTE) If result is NEGATIVE SARS-CoV-2 target nucleic acids are NOT DETECTED. The SARS-CoV-2 RNA is generally detectable in upper and lower  respiratory specimens during the acute phase of infection. The lowest  concentration of SARS-CoV-2 viral copies this assay can detect is 250  copies / mL. A negative result does not preclude SARS-CoV-2 infection  and should not be used as the sole basis for treatment or other  patient management decisions.  A negative result may occur with  improper specimen collection / handling, submission of specimen other  than nasopharyngeal swab, presence of viral mutation(s) within the  areas targeted by this assay, and inadequate number of viral copies  (<250 copies / mL). A negative result must be combined with clinical  observations, patient history, and epidemiological information. If result is POSITIVE SARS-CoV-2 target nucleic acids are DETECTED. The SARS-CoV-2 RNA is generally detectable in upper and lower  respiratory specimens dur ing the acute phase of infection.  Positive  results are indicative of active infection with SARS-CoV-2.  Clinical  correlation with patient history and other diagnostic information is  necessary to determine patient infection status.  Positive results do  not rule out bacterial infection or co-infection with other viruses. If result is PRESUMPTIVE POSTIVE SARS-CoV-2 nucleic acids MAY BE PRESENT.   A presumptive positive result was obtained on the submitted specimen  and confirmed on repeat testing.  While 2019 novel coronavirus  (SARS-CoV-2) nucleic acids may be present in the submitted sample  additional confirmatory testing may be necessary for epidemiological  and / or clinical management purposes  to differentiate between  SARS-CoV-2 and other Sarbecovirus currently known to infect  humans.  If clinically indicated additional testing with an alternate test  methodology [phone]) is advised. The SARS-CoV-2 RNA is  generally  detectable in upper and lower respiratory sp ecimens during the acute  phase of infection. The expected result is Negative. Fact Sheet for Patients:  BoilerBrush.com.cyhttps://www.fda.gov/media/136312/download Fact Sheet for Healthcare Providers: https://pope.com/https://www.fda.gov/media/136313/download This test is not yet approved or cleared by the [place] FDA and has been authorized for detection and/or diagnosis of SARS-CoV-2 by FDA under an Emergency Use Authorization (EUA).  This EUA will remain in effect (meaning this test can be used) for the duration of the COVID-19 declaration under Section 564(b)(1) of the Act, 21 U.S.C. section 360bbb-3(b)(1), unless the authorization is terminated or revoked sooner. Performed at [facility], [address] [place], [place] [address]   Surgical PCR screen     Status: None   Collection Time: 12/22/18  6:57 PM   Specimen: Nasal Mucosa; Nasal Swab  Result Value Ref Range Status   MRSA, PCR NEGATIVE NEGATIVE Final   Staphylococcus aureus NEGATIVE NEGATIVE Final    Comment: (NOTE) The Xpert SA Assay (FDA approved for NASAL specimens in patients 58 years of age and older), is one component of a comprehensive surveillance program. It is not intended to diagnose infection nor to guide or monitor treatment. Performed at [facility], [address] [place], [place] [address]      Time coordinating discharge: >30 minutes  SIGNED:   Eddie NorthNishant Yetzali Weld, MD  [facility] 12/25/2018, 12:16 PM Pager   If 7PM-7AM, please contact night-coverage [email or web address] Password [email or web address]

## 2018-12-26 DIAGNOSIS — I5043 Acute on chronic combined systolic (congestive) and diastolic (congestive) heart failure: Secondary | ICD-10-CM | POA: Diagnosis present

## 2018-12-26 DIAGNOSIS — Z20828 Contact with and (suspected) exposure to other viral communicable diseases: Secondary | ICD-10-CM | POA: Diagnosis present

## 2018-12-26 DIAGNOSIS — I5023 Acute on chronic systolic (congestive) heart failure: Secondary | ICD-10-CM | POA: Diagnosis present

## 2018-12-26 DIAGNOSIS — E785 Hyperlipidemia, unspecified: Secondary | ICD-10-CM | POA: Diagnosis present

## 2018-12-26 DIAGNOSIS — J9621 Acute and chronic respiratory failure with hypoxia: Secondary | ICD-10-CM | POA: Diagnosis present

## 2018-12-26 DIAGNOSIS — I110 Hypertensive heart disease with heart failure: Secondary | ICD-10-CM | POA: Diagnosis present

## 2018-12-26 DIAGNOSIS — D649 Anemia, unspecified: Secondary | ICD-10-CM | POA: Diagnosis present

## 2018-12-26 DIAGNOSIS — F329 Major depressive disorder, single episode, unspecified: Secondary | ICD-10-CM | POA: Diagnosis present

## 2018-12-26 DIAGNOSIS — I872 Venous insufficiency (chronic) (peripheral): Secondary | ICD-10-CM | POA: Diagnosis present

## 2018-12-26 DIAGNOSIS — F17210 Nicotine dependence, cigarettes, uncomplicated: Secondary | ICD-10-CM | POA: Diagnosis present

## 2018-12-26 DIAGNOSIS — Z7984 Long term (current) use of oral hypoglycemic drugs: Secondary | ICD-10-CM | POA: Diagnosis not present

## 2018-12-26 DIAGNOSIS — Z888 Allergy status to other drugs, medicaments and biological substances status: Secondary | ICD-10-CM | POA: Diagnosis not present

## 2018-12-26 DIAGNOSIS — Z9119 Patient's noncompliance with other medical treatment and regimen: Secondary | ICD-10-CM | POA: Diagnosis not present

## 2018-12-26 DIAGNOSIS — Z590 Homelessness: Secondary | ICD-10-CM | POA: Diagnosis not present

## 2018-12-26 DIAGNOSIS — R109 Unspecified abdominal pain: Secondary | ICD-10-CM | POA: Diagnosis present

## 2018-12-26 DIAGNOSIS — Z5329 Procedure and treatment not carried out because of patient's decision for other reasons: Secondary | ICD-10-CM | POA: Diagnosis present

## 2018-12-26 DIAGNOSIS — Z8249 Family history of ischemic heart disease and other diseases of the circulatory system: Secondary | ICD-10-CM | POA: Diagnosis not present

## 2018-12-26 DIAGNOSIS — R0602 Shortness of breath: Secondary | ICD-10-CM | POA: Diagnosis present

## 2018-12-26 DIAGNOSIS — E119 Type 2 diabetes mellitus without complications: Secondary | ICD-10-CM | POA: Diagnosis present

## 2018-12-26 DIAGNOSIS — I714 Abdominal aortic aneurysm, without rupture: Secondary | ICD-10-CM | POA: Diagnosis present

## 2018-12-26 DIAGNOSIS — F200 Paranoid schizophrenia: Secondary | ICD-10-CM | POA: Diagnosis present

## 2018-12-26 DIAGNOSIS — Z79899 Other long term (current) drug therapy: Secondary | ICD-10-CM | POA: Diagnosis not present

## 2018-12-26 DIAGNOSIS — B1920 Unspecified viral hepatitis C without hepatic coma: Secondary | ICD-10-CM | POA: Diagnosis present

## 2018-12-26 DIAGNOSIS — F1420 Cocaine dependence, uncomplicated: Secondary | ICD-10-CM | POA: Diagnosis present

## 2018-12-26 DIAGNOSIS — Z7951 Long term (current) use of inhaled steroids: Secondary | ICD-10-CM | POA: Diagnosis not present

## 2018-12-26 DIAGNOSIS — I441 Atrioventricular block, second degree: Secondary | ICD-10-CM | POA: Diagnosis present

## 2018-12-26 LAB — BASIC METABOLIC PANEL
Anion gap: 8 (ref 5–15)
BUN: 16 mg/dL (ref 6–20)
CO2: 31 mmol/L (ref 22–32)
Calcium: 8.6 mg/dL — ABNORMAL LOW (ref 8.9–10.3)
Chloride: 100 mmol/L (ref 98–111)
Creatinine, Ser: 0.87 mg/dL (ref 0.61–1.24)
GFR calc Af Amer: >60 mL/min (ref >60)
GFR calc non Af Amer: >60 mL/min (ref >60)
Glucose, Bld: 159 mg/dL — ABNORMAL HIGH (ref 70–99)
Potassium: 3.5 mmol/L (ref 3.5–5.1)
Sodium: 139 mmol/L (ref 135–145)

## 2018-12-26 LAB — RAPID URINE DRUG SCREEN, HOSP PERFORMED
Amphetamines: NOT DETECTED
Barbiturates: NOT DETECTED
Benzodiazepines: NOT DETECTED
Cocaine: NOT DETECTED
Opiates: NOT DETECTED
Tetrahydrocannabinol: NOT DETECTED

## 2018-12-26 LAB — GLUCOSE, CAPILLARY
Glucose-Capillary: 148 mg/dL — ABNORMAL HIGH (ref 70–99)
Glucose-Capillary: 179 mg/dL — ABNORMAL HIGH (ref 70–99)
Glucose-Capillary: 167 mg/dL — ABNORMAL HIGH (ref 70–99)
Glucose-Capillary: 249 mg/dL — ABNORMAL HIGH (ref 70–99)

## 2018-12-26 MED ORDER — POTASSIUM CHLORIDE CRYS ER 20 MEQ PO TBCR
20.0000 meq | EXTENDED_RELEASE_TABLET | Freq: Two times a day (BID) | ORAL | Status: DC
  Administered 2018-12-26 – 2018-12-28 (×5): 20 meq via ORAL
  Filled 2018-12-26 (×5): qty 1

## 2018-12-26 MED ORDER — IPRATROPIUM-ALBUTEROL 0.5-2.5 (3) MG/3ML IN SOLN
3.0000 mL | Freq: Three times a day (TID) | RESPIRATORY_TRACT | Status: DC
  Administered 2018-12-27 – 2018-12-30 (×10): 3 mL via RESPIRATORY_TRACT
  Filled 2018-12-26 (×10): qty 3

## 2018-12-26 MED ORDER — LOPERAMIDE HCL 2 MG PO CAPS
2.0000 mg | ORAL_CAPSULE | ORAL | Status: DC | PRN
  Administered 2018-12-26: 2 mg via ORAL
  Filled 2018-12-26: qty 1

## 2018-12-26 NOTE — Progress Notes (Signed)
PROGRESS NOTE    [name]  ZOX:[id number] DOB: June 11, 1961 DOA: 12/25/2018 PCP: Patient, No Pcp Per  Brief Narrative: 58 year old male with chronic combined systolic and diastolic CHF with EF of 81%, chronic respiratory failure on 2 L home O2, polysubstance abuse hypertension, type 2 diabetes mellitus, schizophrenia, HCV, OSA, morbidly obese and homeless with numerous ER visits for shortness of breath and hospitalization for CHF, has left AGAINST MEDICAL ADVICE on several occasions. -He just left AMA from [facility] yesterday, he was admitted with CHF exacerbation, he has also been noted to have intermittent high-grade AV block from which he is asymptomatic however with his noncompliance homelessness and narrow QRS complex not felt to be a good candidate for BiV ICD  Assessment & Plan:   Principal Problem:   Acute on chronic systolic (congestive) heart failure ([place]) -Echo with EF of 25%, issues with compliance, substance abuse and homelessness which limits appropriate medical care -Continue IV Lasix with potassium -Continue lisinopril, no beta-blocker due to recurrent high-grade AV block -Monitor I's/O, daily weights -Follow-up be met in a.m.  Mobitz type II AV block -Asymptomatic recurrent high-grade AV block noted on telemetry through prior hospitalizations including multiple times last week, was followed by cardiology and seen by EP for this, due to complicated situation given noncompliance, homelessness, and narrow QRS complex not felt to be a Good candidate for BiV ICD, based on EP discussion last week -Recommended to avoid AV nodal blocking agents, avoid cocaine and emphasized compliance with medications diuretics  Type 2 diabetes mellitus -Last hemoglobin A1c was 9.7, -Continue Lantus and sliding scale insulin  Polysubstance abuse/cocaine abuse -Counseled, agrees to quit understands that it impedes his ability to receive optimal treatment and placement  Schizophrenia  -Continue Abilify  Homelessness/noncompliance -Numerous ED visits and hospitalizations this month, this is the seventh hospitalization in June 6, including 9 ED visits -Social work, case management consulted  DVT prophylaxis: Lovenox  code Status: Full code Family Communication: No family at bedside Disposition Plan: Pending adequate diuresis  Consultants:      Procedures:   Antimicrobials:    Subjective: -Complains of shortness of breath, swelling in his abdomen scrotum legs etc.  Objective: Vitals:   12/26/18 0454 12/26/18 0752 12/26/18 0801 12/26/18 0855  BP: 130/87 123/79  135/88  Pulse: 91 91  85  Resp: 18 16    Temp: 98.5 F (36.9 C) 98.4 F (36.9 C)    TempSrc: Oral Oral    SpO2: 99% 97% 97% (!) 89%  Weight: 107.1 kg     Height:        Intake/Output Summary (Last 24 hours) at 12/26/2018 1122 Last data filed at 12/26/2018 1914 Gross per 24 hour  Intake 1763 ml  Output 1600 ml  Net 163 ml   Filed Weights   12/25/18 2133 12/26/18 0454  Weight: 106.6 kg 107.1 kg    Examination:  General exam: Chronically ill obese male, sitting up in bed, AAO x3, no distress Respiratory system: Poor air movement bilaterally, fine basilar crackles noted Cardiovascular system: S1 & S2 heard, RRR Gastrointestinal system: Abdomen is nondistended, soft and nontender.Normal bowel sounds heard.  Scrotal edema, lateral abdominal wall edema Central nervous system: Alert and oriented. No focal neurological deficits. Extremities: 2+ edema, Skin: Hyperpigmentation of both lower extremities Psychiatry:  Mood & affect appropriate.     Data Reviewed:   CBC: Recent Labs  Lab 12/20/18 1022 12/22/18 0630 12/22/18 1655 12/23/18 0223 12/25/18 1612  WBC 6.7 9.3  --  6.6  8.4  NEUTROABS 4.9  --   --  4.6 6.7  HGB 8.3* 8.7* 10.5* 8.4* 8.3*  HCT 28.9* 29.8* 31.0* 29.2* 28.6*  MCV 80.5 79.3*  --  80.0 79.0*  PLT 203 273  --  251 572   Basic Metabolic Panel: Recent Labs  Lab  12/23/18 0223 12/24/18 0311 12/25/18 0323 12/25/18 1612 12/26/18 0314  NA 139 139 139 138 139  K 4.0 4.2 4.0 3.8 3.5  CL 102 101 101 99 100  CO2 _0 GLUCOSE 195* 183* 196* 231* 159*  BUN 19 21* _1 CREATININE 0.92 0.90 0.85 1.05 0.87  CALCIUM 8.8* 8.8* 8.8* 8.6* 8.6*   GFR: Estimated Creatinine Clearance: 113 mL/min (by C-G formula based on SCr of 0.87 mg/dL). Liver Function Tests: Recent Labs  Lab 12/25/18 1612  AST 22  ALT 15  ALKPHOS 181*  BILITOT 0.6  PROT 6.8  ALBUMIN 2.8*   Recent Labs  Lab 12/25/18 1612  LIPASE 25   No results for input(s): AMMONIA in the last 168 hours. Coagulation Profile: No results for input(s): INR, PROTIME in the last 168 hours. Cardiac Enzymes: No results for input(s): CKTOTAL, CKMB, CKMBINDEX, TROPONINI in the last 168 hours. BNP (last 3 results) No results for input(s): PROBNP in the last 8760 hours. HbA1C: No results for input(s): HGBA1C in the last 72 hours. CBG: Recent Labs  Lab 12/24/18 1545 12/24/18 2119 12/25/18 0611 12/25/18 2140 12/26/18 0525  GLUCAP 217* 207* 160* 276* 148*   Lipid Profile: No results for input(s): CHOL, HDL, LDLCALC, TRIG, CHOLHDL, LDLDIRECT in the last 72 hours. Thyroid Function Tests: No results for input(s): TSH, T4TOTAL, FREET4, T3FREE, THYROIDAB in the last 72 hours. Anemia Panel: No results for input(s): VITAMINB12, FOLATE, FERRITIN, TIBC, IRON, RETICCTPCT in the last 72 hours. Urine analysis:    Component Value Date/Time   COLORURINE YELLOW 12/18/2018 2353   APPEARANCEUR CLEAR 12/18/2018 2353   LABSPEC 1.016 12/18/2018 2353   PHURINE 7.0 12/18/2018 2353   GLUCOSEU NEGATIVE 12/18/2018 2353   HGBUR NEGATIVE 12/18/2018 2353   BILIRUBINUR NEGATIVE 12/18/2018 2353   KETONESUR NEGATIVE 12/18/2018 2353   PROTEINUR 100 (A) 12/18/2018 2353   UROBILINOGEN 1.0 03/14/2015 0610   NITRITE NEGATIVE 12/18/2018 2353   LEUKOCYTESUR NEGATIVE 12/18/2018 2353   Sepsis Labs:  _2 (procalcitonin:4,lacticidven:4)  ) Recent Results (from the past 240 hour(s))  SARS Coronavirus 2 (CEPHEID- Performed in [facility] lab), Hosp Order     Status: None   Collection Time: 12/19/18  1:37 AM   Specimen: Nasopharyngeal Swab  Result Value Ref Range Status   SARS Coronavirus 2 NEGATIVE NEGATIVE Final    Comment: (NOTE) If result is NEGATIVE SARS-CoV-2 target nucleic acids are NOT DETECTED. The SARS-CoV-2 RNA is generally detectable in upper and lower  respiratory specimens during the acute phase of infection. The lowest  concentration of SARS-CoV-2 viral copies this assay can detect is 250  copies / mL. A negative result does not preclude SARS-CoV-2 infection  and should not be used as the sole basis for treatment or other  patient management decisions.  A negative result may occur with  improper specimen collection / handling, submission of specimen other  than nasopharyngeal swab, presence of viral mutation(s) within the  areas targeted by this assay, and inadequate number of viral copies  (<250 copies / mL). A negative result must be combined with clinical  observations, patient history, and epidemiological information. If result is POSITIVE SARS-CoV-2  target nucleic acids are DETECTED. The SARS-CoV-2 RNA is generally detectable in upper and lower  respiratory specimens dur ing the acute phase of infection.  Positive  results are indicative of active infection with SARS-CoV-2.  Clinical  correlation with patient history and other diagnostic information is  necessary to determine patient infection status.  Positive results do  not rule out bacterial infection or co-infection with other viruses. If result is PRESUMPTIVE POSTIVE SARS-CoV-2 nucleic acids MAY BE PRESENT.   A presumptive positive result was obtained on the submitted specimen  and confirmed on repeat testing.  While 2019 novel coronavirus  (SARS-CoV-2) nucleic acids may be present in the  submitted sample  additional confirmatory testing may be necessary for epidemiological  and / or clinical management purposes  to differentiate between  SARS-CoV-2 and other Sarbecovirus currently known to infect humans.  If clinically indicated additional testing with an alternate test  methodology [phone]) is advised. The SARS-CoV-2 RNA is generally  detectable in upper and lower respiratory sp ecimens during the acute  phase of infection. The expected result is Negative. Fact Sheet for Patients:  StrictlyIdeas.no Fact Sheet for Healthcare Providers: BankingDealers.co.za This test is not yet approved or cleared by the [place] FDA and has been authorized for detection and/or diagnosis of SARS-CoV-2 by FDA under an Emergency Use Authorization (EUA).  This EUA will remain in effect (meaning this test can be used) for the duration of the COVID-19 declaration under Section 564(b)(1) of the Act, 21 U.S.C. section 360bbb-3(b)(1), unless the authorization is terminated or revoked sooner. Performed at [facility], [place] [address], [place], [place] [address]   SARS Coronavirus 2 (CEPHEID - Performed in [place] hospital lab), Hosp Order     Status: None   Collection Time: 12/22/18  8:32 AM   Specimen: Nasopharyngeal Swab  Result Value Ref Range Status   SARS Coronavirus 2 NEGATIVE NEGATIVE Final    Comment: (NOTE) If result is NEGATIVE SARS-CoV-2 target nucleic acids are NOT DETECTED. The SARS-CoV-2 RNA is generally detectable in upper and lower  respiratory specimens during the acute phase of infection. The lowest  concentration of SARS-CoV-2 viral copies this assay can detect is 250  copies / mL. A negative result does not preclude SARS-CoV-2 infection  and should not be used as the sole basis for treatment or other  patient management decisions.  A negative result may occur with  improper specimen collection / handling,  submission of specimen other  than nasopharyngeal swab, presence of viral mutation(s) within the  areas targeted by this assay, and inadequate number of viral copies  (<250 copies / mL). A negative result must be combined with clinical  observations, patient history, and epidemiological information. If result is POSITIVE SARS-CoV-2 target nucleic acids are DETECTED. The SARS-CoV-2 RNA is generally detectable in upper and lower  respiratory specimens dur ing the acute phase of infection.  Positive  results are indicative of active infection with SARS-CoV-2.  Clinical  correlation with patient history and other diagnostic information is  necessary to determine patient infection status.  Positive results do  not rule out bacterial infection or co-infection with other viruses. If result is PRESUMPTIVE POSTIVE SARS-CoV-2 nucleic acids MAY BE PRESENT.   A presumptive positive result was obtained on the submitted specimen  and confirmed on repeat testing.  While 2019 novel coronavirus  (SARS-CoV-2) nucleic acids may be present in the submitted sample  additional confirmatory testing may be necessary for epidemiological  and /  or clinical management purposes  to differentiate between  SARS-CoV-2 and other Sarbecovirus currently known to infect humans.  If clinically indicated additional testing with an alternate test  methodology [phone]) is advised. The SARS-CoV-2 RNA is generally  detectable in upper and lower respiratory sp ecimens during the acute  phase of infection. The expected result is Negative. Fact Sheet for Patients:  StrictlyIdeas.no Fact Sheet for Healthcare Providers: BankingDealers.co.za This test is not yet approved or cleared by the [place] FDA and has been authorized for detection and/or diagnosis of SARS-CoV-2 by FDA under an Emergency Use Authorization (EUA).  This EUA will remain in effect (meaning this test can be  used) for the duration of the COVID-19 declaration under Section 564(b)(1) of the Act, 21 U.S.C. section 360bbb-3(b)(1), unless the authorization is terminated or revoked sooner. Performed at [facility], [place] [address], [place], [place] [address]   Surgical PCR screen     Status: None   Collection Time: 12/22/18  6:57 PM   Specimen: Nasal Mucosa; Nasal Swab  Result Value Ref Range Status   MRSA, PCR NEGATIVE NEGATIVE Final   Staphylococcus aureus NEGATIVE NEGATIVE Final    Comment: (NOTE) The Xpert SA Assay (FDA approved for NASAL specimens in patients 61 years of age and older), is one component of a comprehensive surveillance program. It is not intended to diagnose infection nor to guide or monitor treatment. Performed at [facility], [place] [address] [place], [place] [address]   SARS Coronavirus 2 (CEPHEID - Performed in [facility] hospital lab), Hosp Order     Status: None   Collection Time: 12/25/18  8:17 PM   Specimen: Nasopharyngeal Swab  Result Value Ref Range Status   SARS Coronavirus 2 NEGATIVE NEGATIVE Final    Comment: (NOTE) If result is NEGATIVE SARS-CoV-2 target nucleic acids are NOT DETECTED. The SARS-CoV-2 RNA is generally detectable in upper and lower  respiratory specimens during the acute phase of infection. The lowest  concentration of SARS-CoV-2 viral copies this assay can detect is 250  copies / mL. A negative result does not preclude SARS-CoV-2 infection  and should not be used as the sole basis for treatment or other  patient management decisions.  A negative result may occur with  improper specimen collection / handling, submission of specimen other  than nasopharyngeal swab, presence of viral mutation(s) within the  areas targeted by this assay, and inadequate number of viral copies  (<250 copies / mL). A negative result must be combined with clinical  observations, patient history, and epidemiological information. If result is  POSITIVE SARS-CoV-2 target nucleic acids are DETECTED. The SARS-CoV-2 RNA is generally detectable in upper and lower  respiratory specimens dur ing the acute phase of infection.  Positive  results are indicative of active infection with SARS-CoV-2.  Clinical  correlation with patient history and other diagnostic information is  necessary to determine patient infection status.  Positive results do  not rule out bacterial infection or co-infection with other viruses. If result is PRESUMPTIVE POSTIVE SARS-CoV-2 nucleic acids MAY BE PRESENT.   A presumptive positive result was obtained on the submitted specimen  and confirmed on repeat testing.  While 2019 novel coronavirus  (SARS-CoV-2) nucleic acids may be present in the submitted sample  additional confirmatory testing may be necessary for epidemiological  and / or clinical management purposes  to differentiate between  SARS-CoV-2 and other Sarbecovirus currently known to infect humans.  If clinically indicated additional testing with  an alternate test  methodology [phone]) is advised. The SARS-CoV-2 RNA is generally  detectable in upper and lower respiratory sp ecimens during the acute  phase of infection. The expected result is Negative. Fact Sheet for Patients:  StrictlyIdeas.no Fact Sheet for Healthcare Providers: BankingDealers.co.za This test is not yet approved or cleared by the [place] FDA and has been authorized for detection and/or diagnosis of SARS-CoV-2 by FDA under an Emergency Use Authorization (EUA).  This EUA will remain in effect (meaning this test can be used) for the duration of the COVID-19 declaration under Section 564(b)(1) of the Act, 21 U.S.C. section 360bbb-3(b)(1), unless the authorization is terminated or revoked sooner. Performed at [facility], [place] [address], [place],  [address]          Radiology Studies: Dg Chest [place] 1 View   Result Date: 12/25/2018 CLINICAL DATA:  58 year old male with shortness of breath EXAM: PORTABLE CHEST 1 VIEW COMPARISON:  Chest radiograph dated 12/22/2018 FINDINGS: There is cardiomegaly with vascular congestion and probable small bilateral pleural effusions. There are bibasilar atelectasis. Pneumonia is not excluded. There is no pneumothorax. No acute osseous pathology. Stable small radiopaque foreign object over the right diaphragm. IMPRESSION: Cardiomegaly with findings of CHF and small bilateral pleural effusions, slightly worsened since the prior radiograph. Pneumonia is not excluded. Electronically Signed   By: Anner Crete M.D.   On: 12/25/2018 19:44        Scheduled Meds: . ARIPiprazole  10 mg Oral Daily  . aspirin EC  81 mg Oral Daily  . carbamazepine  100 mg Oral BID  . enoxaparin (LOVENOX) injection  40 mg Subcutaneous Q24H  . furosemide  40 mg Intravenous Q12H  . insulin aspart  0-5 Units Subcutaneous QHS  . insulin aspart  0-9 Units Subcutaneous TID WC  . ipratropium-albuterol  3 mL Inhalation Q6H  . lisinopril  10 mg Oral Daily  . mouth rinse  15 mL Mouth Rinse BID  . nicotine  21 mg Transdermal Daily  . pantoprazole  40 mg Oral Q1200  . sodium chloride flush  3 mL Intravenous Q12H   Continuous Infusions: . sodium chloride       LOS: 0 days    Time spent: 23mn    PDomenic Polite MD [facility] Page via [email or web address], password [email or web address] After 7PM please contact night-coverage  12/26/2018, 11:22 AM

## 2018-12-26 NOTE — Progress Notes (Signed)
Patient would like double portions of meals.   Paged MD.    She oks Double portions.   Placed comment in diet order.

## 2018-12-26 NOTE — Plan of Care (Signed)
  Problem: Nutrition: Goal: Adequate nutrition will be maintained Outcome: Completed/Met   Problem: Elimination: Goal: Will not experience complications related to bowel motility Outcome: Completed/Met   

## 2018-12-27 LAB — CBC
HCT: 29.3 % — ABNORMAL LOW (ref 39.0–52.0)
Hemoglobin: 8.5 g/dL — ABNORMAL LOW (ref 13.0–17.0)
MCH: 22.7 pg — ABNORMAL LOW (ref 26.0–34.0)
MCHC: 29 g/dL — ABNORMAL LOW (ref 30.0–36.0)
MCV: 78.3 fL — ABNORMAL LOW (ref 80.0–100.0)
Platelets: 312 10*3/uL (ref 150–400)
RBC: 3.74 MIL/uL — ABNORMAL LOW (ref 4.22–5.81)
RDW: 20.9 % — ABNORMAL HIGH (ref 11.5–15.5)
WBC: 5.8 10*3/uL (ref 4.0–10.5)
nRBC: 0 % (ref 0.0–0.2)

## 2018-12-27 LAB — BASIC METABOLIC PANEL
Anion gap: 9 (ref 5–15)
BUN: 17 mg/dL (ref 6–20)
CO2: 28 mmol/L (ref 22–32)
Calcium: 8.7 mg/dL — ABNORMAL LOW (ref 8.9–10.3)
Chloride: 100 mmol/L (ref 98–111)
Creatinine, Ser: 0.88 mg/dL (ref 0.61–1.24)
GFR calc Af Amer: >60 mL/min (ref >60)
GFR calc non Af Amer: >60 mL/min (ref >60)
Glucose, Bld: 170 mg/dL — ABNORMAL HIGH (ref 70–99)
Potassium: 3.8 mmol/L (ref 3.5–5.1)
Sodium: 137 mmol/L (ref 135–145)

## 2018-12-27 LAB — GLUCOSE, CAPILLARY
Glucose-Capillary: 163 mg/dL — ABNORMAL HIGH (ref 70–99)
Glucose-Capillary: 226 mg/dL — ABNORMAL HIGH (ref 70–99)
Glucose-Capillary: 174 mg/dL — ABNORMAL HIGH (ref 70–99)
Glucose-Capillary: 218 mg/dL — ABNORMAL HIGH (ref 70–99)

## 2018-12-27 MED ORDER — DIPHENHYDRAMINE HCL 25 MG PO CAPS
25.0000 mg | ORAL_CAPSULE | Freq: Once | ORAL | Status: AC
  Administered 2018-12-27: 25 mg via ORAL
  Filled 2018-12-27: qty 1

## 2018-12-27 MED ORDER — ZOLPIDEM TARTRATE 5 MG PO TABS: 5.0000 mg | ORAL_TABLET | Freq: Every evening | ORAL | Status: DC | PRN

## 2018-12-27 MED ORDER — ZOLPIDEM TARTRATE 5 MG PO TABS
5.0000 mg | ORAL_TABLET | Freq: Every evening | ORAL | Status: DC | PRN
  Administered 2018-12-27 – 2018-12-29 (×3): 5 mg via ORAL
  Filled 2018-12-27 (×3): qty 1

## 2018-12-27 NOTE — TOC Initial Note (Signed)
Transition of Care [facility]) - Initial/Assessment Note    Patient Details  Name: [name] MRN: [id number] Date of Birth: Sep 05, 1960  Transition of Care [facility]) CM/SW Contact:    Bary Castilla, LCSW Phone Number: [phone]. 12/27/2018, 11:47 AM  Clinical Narrative:   CSW has met with patient bedside throughout the weekend. CSW discussed discharge planning with patient and he informed CSW that he wanted to go to a group home. CSW asked patient if he had been to a group home in the past and he stated yes, years ago. Patient gave CSW permission to speak with group homes in regards to placement.  CSW asked if patient had any family members. Patient gave CSW  Candace(niece) contact information and stated that she could call her. CSW called her at [phone] and the mailbox was full therefore no message could be left.  CSW called several groups homes in behalf of client. See efforts below.  [facility] in [place]- no beds. [place] [phone])- left message [place] a working number [facility]- a fax number [place]- No bed availability [phone]) No answer      CSW updated patient about efforts for securing a group home placement.               Expected Discharge Plan: Group Home Barriers to Discharge: Other (comment)(Securing a group home)   Patient Goals and CMS Choice        Expected Discharge Plan and Services Expected Discharge Plan: [place] arrangements for the past 2 months: Homeless                                      Prior Living Arrangements/Services Living arrangements for the past 2 months: Homeless            Need for Family Participation in Patient Care: No (Comment) Care giver support system in place?: No (comment)   Criminal Activity/Legal Involvement Pertinent to Current Situation/Hospitalization: No - Comment as needed  Activities  of Daily Living Home Assistive Devices/Equipment: Walker (specify type) ADL Screening (condition at time of admission) Patient's cognitive ability adequate to safely complete daily activities?: Yes Is the patient deaf or have difficulty hearing?: No Does the patient have difficulty seeing, even when wearing glasses/contacts?: No Does the patient have difficulty concentrating, remembering, or making decisions?: No Patient able to express need for assistance with ADLs?: Yes Does the patient have difficulty dressing or bathing?: Yes Independently performs ADLs?: Yes (appropriate for developmental age) Does the patient have difficulty walking or climbing stairs?: Yes Weakness of Legs: Both Weakness of Arms/Hands: Both  Permission Sought/Granted Permission sought to share information with : Other (comment)(Niece) Permission granted to share information with : Yes, Verbal Permission Granted  Share Information with NAME: Candace  Permission granted to share info w AGENCY: Group Homes  Permission granted to share info w Relationship: Niece  Permission granted to share info w Contact Information: [phone]  Emotional Assessment Appearance:: Appears stated age Attitude/Demeanor/Rapport: Engaged, Reactive Affect (typically observed): Accepting, Frustrated Orientation: : Oriented to Self, Oriented to Place, Oriented to  Time, Oriented to Situation Alcohol / Substance Use: Illicit Drugs    Admission diagnosis:  SOB (shortness of breath) [R06.02] Hypoxia [R09.02] Homelessness [Z59.0] Patient Active Problem List   Diagnosis Date Noted  .  Acute on chronic systolic heart failure ([place]) 12/26/2018  . Mobitz type II atrioventricular block 12/25/2018  . Acute exacerbation of CHF (congestive heart failure) ([place]) 12/22/2018  . Acute on chronic respiratory failure with hypoxia ([place]) 12/22/2018  . CHF exacerbation ([place]) 12/19/2018  . Acute CHF (congestive heart failure) ([place]) 12/16/2018  .  Diabetes mellitus without complication ([place]) 44/81/8563  . Acute on chronic systolic (congestive) heart failure ([place]) 12/11/2018  . Abdominal pain 12/11/2018  . Nausea vomiting and diarrhea 12/11/2018  . Anemia 12/11/2018  . Evaluation by psychiatric service required   . MDD (major depressive disorder), severe ([place]) 11/25/2018  . Pressure injury of skin 11/08/2018  . Elevated troponin 10/18/2018  . HLD (hyperlipidemia) 10/18/2018  . Anxiety 10/18/2018  . CHF (congestive heart failure), NYHA class II, acute on chronic, combined ([place]) 10/18/2018  . Hypertensive urgency 10/12/2018  . Mild renal insufficiency 10/12/2018  . Cellulitis 10/12/2018  . Penile cellulitis   . Adjustment disorder with mixed disturbance of emotions and conduct   . Sleep apnea 10/03/2018  . Hypokalemia 09/17/2018  . Scrotal swelling 09/17/2018  . Urinary hesitancy   . Constipation   . Acute on chronic combined systolic and diastolic CHF (congestive heart failure) ([place]) 08/25/2018  . Tobacco use 08/18/2018  . Homelessness 08/08/2018  . Smoker 08/08/2018  . Prostate enlargement 03/16/2018  . Aortic atherosclerosis ([place]) 03/16/2018  . 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 Chronic foot pain   . Schizoaffective disorder, bipolar type ([place]) 09/30/2016  . Substance induced mood disorder ([place]) 03/13/2015  . Schizophrenia, paranoid type ([place]) 01/17/2015  . Drug hallucinosis ([place]) 10/08/2014  . Chronic paranoid schizophrenia ([place]) 09/07/2014  . Substance or medication-induced bipolar and related disorder with onset during intoxication ([place]) 08/10/2014  . Urinary retention   . Cocaine use disorder, severe, dependence ([place])   . Essential hypertension, benign 03/28/2013  . Insulin-requiring or dependent type II diabetes mellitus ([place]) 03/15/2013   PCP:  Patient, No Pcp Per Pharmacy:   [facility], [address] [place] [address] Phone:  [phone] Fax: [phone]  [place], [place] - [address] [place] [address] Phone: [phone] Fax: [phone]  [facility] [phone] - [place], [place] - [address] [place] [address] Phone: [phone] Fax: [phone]     Social Determinants of Health (SDOH) Interventions    Readmission Risk Interventions Readmission Risk Prevention Plan 12/18/2018 10/05/2018  Transportation Screening Complete Complete  Medication Review Press photographer) Complete Complete  PCP or Specialist appointment within 3-5 days of discharge Complete Complete  HRI or Home Care Consult Complete Patient refused  SW Recovery Care/Counseling Consult Complete Complete  Palliative Care Screening Not Applicable Not [place] Not Applicable Not Applicable  Some recent data might be hidden

## 2018-12-27 NOTE — Progress Notes (Signed)
PROGRESS NOTE    Taylor MemosFrederick L [name]  WUJ:[id number] DOB: [birth year] DOA: 12/25/2018 PCP: Patient, No Pcp Per  Brief Narrative: 58 year old male with chronic combined systolic and diastolic CHF with EF of 25%, chronic respiratory failure on 2 L home O2, polysubstance abuse hypertension, type 2 diabetes mellitus, schizophrenia, HCV, OSA, morbidly obese and homeless with numerous ER visits for shortness of breath and hospitalization for CHF, has left AGAINST MEDICAL ADVICE on several occasions. -He [facility], he was admitted with CHF exacerbation, he has also been noted to have intermittent high-grade AV block from which he is asymptomatic however with his noncompliance homelessness and narrow QRS complex not felt to be a good candidate for BiV ICD  Assessment & Plan:   Acute on chronic systolic (congestive) heart failure (HCC) -Echo with EF of 25%, issues with compliance, substance abuse and homelessness which limits appropriate medical care -Clinically improving with diuresis, he is 1.8 L negative, gently having double portions and double fluid intake will attempt some fluid restriction -Continue lisinopril, no beta-blocker due to recurrent high-grade AV block -Monitor I's/O, daily weights, labs in a.m.  Mobitz type II AV block -Asymptomatic recurrent high-grade AV block noted on telemetry through prior hospitalizations including multiple times last week, was followed by cardiology and seen by EP for this, due to complicated situation given noncompliance, homelessness, and narrow QRS complex not felt to be a Good candidate for BiV ICD, based on EP discussion last week -Recommended to avoid AV nodal blocking agents, avoid cocaine and emphasized compliance with medications diuretics  Type 2 diabetes mellitus -Last hemoglobin A1c was 9.7, -Continue Lantus and sliding scale insulin -Stable  Polysubstance abuse/cocaine abuse -Counseled, agrees to quit understands that it  impedes his ability to receive optimal treatment and placement  Schizophrenia -Continue Abilify  Homelessness/noncompliance -Numerous ED visits and hospitalizations this month, this is the seventh hospitalization in June 6, including 9 ED visits -Social work, case management consulted  DVT prophylaxis: Lovenox  code Status: Full code Family Communication: No family at bedside Disposition Plan: disposition unknown pending adequate diuresis  Consultants:      Procedures:   Antimicrobials:    Subjective: -Eating double portions now reports that his breathing is improving however has significant swelling in his abdomen and legs scrotum etc.  Objective: Vitals:   12/27/18 0501 12/27/18 0755 12/27/18 1007 12/27/18 1130  BP: (!) 149/99  (!) 153/89 (!) 143/93  Pulse: 98  96 93  Resp: 18   20  Temp: 98.3 F (36.8 C)   98.6 F (37 C)  TempSrc: Oral   Oral  SpO2: (!) 89% 93% 93% 93%  Weight:      Height:        Intake/Output Summary (Last 24 hours) at 12/27/2018 1331 Last data filed at 12/27/2018 1310 Gross per 24 hour  Intake 2460 ml  Output 3375 ml  Net -915 ml   Filed Weights   12/25/18 2133 12/26/18 0454 12/27/18 0256  Weight: 106.6 kg 107.1 kg 107.7 kg    Examination:  Gen: Obese chronically ill male sitting up in bed, AAO x3 HEENT: PERRLA, Neck supple, positive JVD  lungs: Fine basilar crackles, poor air movement  CVS: RRR,No Gallops,Rubs or new Murmurs Abd: soft, Non tender, non distended, BS present, umbilical hernia Extremities: 2+ edema, scrotal swelling, Skin: Hyperpigmentation of both lower extremities Psychiatry:  Mood & affect appropriate.     Data Reviewed:   CBC: Recent Labs  Lab 12/22/18 0630 12/22/18 1655  12/23/18 0223 12/25/18 1612 12/27/18 0259  WBC 9.3  --  6.6 8.4 5.8  NEUTROABS  --   --  4.6 6.7  --   HGB 8.7* 10.5* 8.4* 8.3* 8.5*  HCT 29.8* 31.0* 29.2* 28.6* 29.3*  MCV 79.3*  --  80.0 79.0* 78.3*  PLT 273  --  251 296 312    Basic Metabolic Panel: Recent Labs  Lab 12/24/18 0311 12/25/18 0323 12/25/18 1612 12/26/18 0314 12/27/18 0259  NA 139 139 138 139 137  K 4.2 4.0 3.8 3.5 3.8  CL 101 101 99 100 100  CO2 30 29 29 31 28   GLUCOSE 183* 196* 231* 159* 170*  BUN 21* 20 16 16 17   CREATININE 0.90 0.85 1.05 0.87 0.88  CALCIUM 8.8* 8.8* 8.6* 8.6* 8.7*   GFR: Estimated Creatinine Clearance: 112 mL/min (by C-G formula based on SCr of 0.88 mg/dL). Liver Function Tests: Recent Labs  Lab 12/25/18 1612  AST 22  ALT 15  ALKPHOS 181*  BILITOT 0.6  PROT 6.8  ALBUMIN 2.8*   Recent Labs  Lab 12/25/18 1612  LIPASE 25   No results for input(s): AMMONIA in the last 168 hours. Coagulation Profile: No results for input(s): INR, PROTIME in the last 168 hours. Cardiac Enzymes: No results for input(s): CKTOTAL, CKMB, CKMBINDEX, TROPONINI in the last 168 hours. BNP (last 3 results) No results for input(s): PROBNP in the last 8760 hours. HbA1C: No results for input(s): HGBA1C in the last 72 hours. CBG: Recent Labs  Lab 12/26/18 1209 12/26/18 1635 12/26/18 2038 12/27/18 0606 12/27/18 1132  GLUCAP 179* 167* 249* 163* 226*   Lipid Profile: No results for input(s): CHOL, HDL, LDLCALC, TRIG, CHOLHDL, LDLDIRECT in the last 72 hours. Thyroid Function Tests: No results for input(s): TSH, T4TOTAL, FREET4, T3FREE, THYROIDAB in the last 72 hours. Anemia Panel: No results for input(s): VITAMINB12, FOLATE, FERRITIN, TIBC, IRON, RETICCTPCT in the last 72 hours. Urine analysis:    Component Value Date/Time   COLORURINE YELLOW 12/18/2018 2353   APPEARANCEUR CLEAR 12/18/2018 2353   LABSPEC 1.016 12/18/2018 2353   PHURINE 7.0 12/18/2018 2353   GLUCOSEU NEGATIVE 12/18/2018 2353   HGBUR NEGATIVE 12/18/2018 2353   BILIRUBINUR NEGATIVE 12/18/2018 2353   KETONESUR NEGATIVE 12/18/2018 2353   PROTEINUR 100 (A) 12/18/2018 2353   UROBILINOGEN 1.0 03/14/2015 0610   NITRITE NEGATIVE 12/18/2018 2353   LEUKOCYTESUR  NEGATIVE 12/18/2018 2353   Sepsis Labs: @LABRCNTIP (procalcitonin:4,lacticidven:4)  ) Recent Results (from the past 240 hour(s))  SARS Coronavirus 2 (CEPHEID- Performed in [facility]), Hosp Order     Status: None   Collection Time: 12/19/18  1:37 AM   Specimen: Nasopharyngeal Swab  Result Value Ref Range Status   SARS Coronavirus 2 NEGATIVE NEGATIVE Final    Comment: (NOTE) If result is NEGATIVE SARS-CoV-2 target nucleic acids are NOT DETECTED. The SARS-CoV-2 RNA is generally detectable in upper and lower  respiratory specimens during the acute phase of infection. The lowest  concentration of SARS-CoV-2 viral copies this assay can detect is 250  copies / mL. A negative result does not preclude SARS-CoV-2 infection  and should not be used as the sole basis for treatment or other  patient management decisions.  A negative result may occur with  improper specimen collection / handling, submission of specimen other  than nasopharyngeal swab, presence of viral mutation(s) within the  areas targeted by this assay, and inadequate number of viral copies  (<250 copies / mL). A negative result must  be combined with clinical  observations, patient history, and epidemiological information. If result is POSITIVE SARS-CoV-2 target nucleic acids are DETECTED. The SARS-CoV-2 RNA is generally detectable in upper and lower  respiratory specimens dur ing the acute phase of infection.  Positive  results are indicative of active infection with SARS-CoV-2.  Clinical  correlation with patient history and other diagnostic information is  necessary to determine patient infection status.  Positive results do  not rule out bacterial infection or co-infection with other viruses. If result is PRESUMPTIVE POSTIVE SARS-CoV-2 nucleic acids MAY BE PRESENT.   A presumptive positive result was obtained on the submitted specimen  and confirmed on repeat testing.  While 2019 novel coronavirus   (SARS-CoV-2) nucleic acids may be present in the submitted sample  additional confirmatory testing may be necessary for epidemiological  and / or clinical management purposes  to differentiate between  SARS-CoV-2 and other Sarbecovirus currently known to infect humans.  If clinically indicated additional testing with an alternate test  methodology [phone]) is advised. The SARS-CoV-2 RNA is generally  detectable in upper and lower respiratory sp ecimens during the acute  phase of infection. The expected result is Negative. Fact Sheet for Patients:  StrictlyIdeas.no Fact Sheet for Healthcare Providers: BankingDealers.co.za This test is not yet approved or cleared by the [place] FDA and has been authorized for detection and/or diagnosis of SARS-CoV-2 by FDA under an Emergency Use Authorization (EUA).  This EUA will remain in effect (meaning this test can be used) for the duration of the COVID-19 declaration under Section 564(b)(1) of the Act, 21 U.S.C. section 360bbb-3(b)(1), unless the authorization is terminated or revoked sooner. Performed at [facility], [place] [address] [place],  [address]   SARS Coronavirus 2 (CEPHEID - Performed in [facility] hospital lab), Hosp Order     Status: None   Collection Time: 12/22/18  8:32 AM   Specimen: Nasopharyngeal Swab  Result Value Ref Range Status   SARS Coronavirus 2 NEGATIVE NEGATIVE Final    Comment: (NOTE) If result is NEGATIVE SARS-CoV-2 target nucleic acids are NOT DETECTED. The SARS-CoV-2 RNA is generally detectable in upper and lower  respiratory specimens during the acute phase of infection. The lowest  concentration of SARS-CoV-2 viral copies this assay can detect is 250  copies / mL. A negative result does not preclude SARS-CoV-2 infection  and should not be used as the sole basis for treatment or other  patient management decisions.  A negative result may occur  with  improper specimen collection / handling, submission of specimen other  than nasopharyngeal swab, presence of viral mutation(s) within the  areas targeted by this assay, and inadequate number of viral copies  (<250 copies / mL). A negative result must be combined with clinical  observations, patient history, and epidemiological information. If result is POSITIVE SARS-CoV-2 target nucleic acids are DETECTED. The SARS-CoV-2 RNA is generally detectable in upper and lower  respiratory specimens dur ing the acute phase of infection.  Positive  results are indicative of active infection with SARS-CoV-2.  Clinical  correlation with patient history and other diagnostic information is  necessary to determine patient infection status.  Positive results do  not rule out bacterial infection or co-infection with other viruses. If result is PRESUMPTIVE POSTIVE SARS-CoV-2 nucleic acids MAY BE PRESENT.   A presumptive positive result was obtained on the submitted specimen  and confirmed on repeat testing.  While 2019 novel coronavirus  (SARS-CoV-2) nucleic acids may be present  in the submitted sample  additional confirmatory testing may be necessary for epidemiological  and / or clinical management purposes  to differentiate between  SARS-CoV-2 and other Sarbecovirus currently known to infect humans.  If clinically indicated additional testing with an alternate test  methodology [phone]) is advised. The SARS-CoV-2 RNA is generally  detectable in upper and lower respiratory sp ecimens during the acute  phase of infection. The expected result is Negative. Fact Sheet for Patients:  BoilerBrush.com.cyhttps://www.fda.gov/media/136312/download Fact Sheet for Healthcare Providers: https://pope.com/https://www.fda.gov/media/136313/download This test is not yet approved or cleared by the [place] FDA and has been authorized for detection and/or diagnosis of SARS-CoV-2 by FDA under an Emergency Use Authorization (EUA).  This EUA  will remain in effect (meaning this test can be used) for the duration of the COVID-19 declaration under Section 564(b)(1) of the Act, 21 U.S.C. section 360bbb-3(b)(1), unless the authorization is terminated or revoked sooner. Performed at [facility], [address] [place], [place] [address]   Surgical PCR screen     Status: None   Collection Time: 12/22/18  6:57 PM   Specimen: Nasal Mucosa; Nasal Swab  Result Value Ref Range Status   MRSA, PCR NEGATIVE NEGATIVE Final   Staphylococcus aureus NEGATIVE NEGATIVE Final    Comment: (NOTE) The Xpert SA Assay (FDA approved for NASAL specimens in patients 58 years of age and older), is one component of a comprehensive surveillance program. It is not intended to diagnose infection nor to guide or monitor treatment. Performed at [facility], [address] [place], [place] [address]   SARS Coronavirus 2 (CEPHEID - Performed in [facility] hospital lab), Hosp Order     Status: None   Collection Time: 12/25/18  8:17 PM   Specimen: Nasopharyngeal Swab  Result Value Ref Range Status   SARS Coronavirus 2 NEGATIVE NEGATIVE Final    Comment: (NOTE) If result is NEGATIVE SARS-CoV-2 target nucleic acids are NOT DETECTED. The SARS-CoV-2 RNA is generally detectable in upper and lower  respiratory specimens during the acute phase of infection. The lowest  concentration of SARS-CoV-2 viral copies this assay can detect is 250  copies / mL. A negative result does not preclude SARS-CoV-2 infection  and should not be used as the sole basis for treatment or other  patient management decisions.  A negative result may occur with  improper specimen collection / handling, submission of specimen other  than nasopharyngeal swab, presence of viral mutation(s) within the  areas targeted by this assay, and inadequate number of viral copies  (<250 copies / mL). A negative result must be combined with clinical  observations, patient history, and  epidemiological information. If result is POSITIVE SARS-CoV-2 target nucleic acids are DETECTED. The SARS-CoV-2 RNA is generally detectable in upper and lower  respiratory specimens dur ing the acute phase of infection.  Positive  results are indicative of active infection with SARS-CoV-2.  Clinical  correlation with patient history and other diagnostic information is  necessary to determine patient infection status.  Positive results do  not rule out bacterial infection or co-infection with other viruses. If result is PRESUMPTIVE POSTIVE SARS-CoV-2 nucleic acids MAY BE PRESENT.   A presumptive positive result was obtained on the submitted specimen  and confirmed on repeat testing.  While 2019 novel coronavirus  (SARS-CoV-2) nucleic acids may be present in the submitted sample  additional confirmatory testing may be necessary for epidemiological  and / or clinical management purposes  to differentiate between  SARS-CoV-2 and other Sarbecovirus currently known to infect humans.  If clinically indicated additional testing with an alternate test  methodology [phone]) is advised. The SARS-CoV-2 RNA is generally  detectable in upper and lower respiratory sp ecimens during the acute  phase of infection. The expected result is Negative. Fact Sheet for Patients:  BoilerBrush.com.cy Fact Sheet for Healthcare Providers: https://pope.com/ This test is not yet approved or cleared by the [place] FDA and has been authorized for detection and/or diagnosis of SARS-CoV-2 by FDA under an Emergency Use Authorization (EUA).  This EUA will remain in effect (meaning this test can be used) for the duration of the COVID-19 declaration under Section 564(b)(1) of the Act, 21 U.S.C. section 360bbb-3(b)(1), unless the authorization is terminated or revoked sooner. Performed at [facility], [address] [place], [place] [address]           Radiology Studies: Dg Chest [place] 1 View  Result Date: 12/25/2018 CLINICAL DATA:  58 year old male with shortness of breath EXAM: PORTABLE CHEST 1 VIEW COMPARISON:  Chest radiograph dated 12/22/2018 FINDINGS: There is cardiomegaly with vascular congestion and probable small bilateral pleural effusions. There are bibasilar atelectasis. Pneumonia is not excluded. There is no pneumothorax. No acute osseous pathology. Stable small radiopaque foreign object over the right diaphragm. IMPRESSION: Cardiomegaly with findings of CHF and small bilateral pleural effusions, slightly worsened since the prior radiograph. Pneumonia is not excluded. Electronically Signed   By: Elgie Collard M.D.   On: 12/25/2018 19:44        Scheduled Meds: . ARIPiprazole  10 mg Oral Daily  . aspirin EC  81 mg Oral Daily  . carbamazepine  100 mg Oral BID  . enoxaparin (LOVENOX) injection  40 mg Subcutaneous Q24H  . furosemide  40 mg Intravenous Q12H  . insulin aspart  0-5 Units Subcutaneous QHS  . insulin aspart  0-9 Units Subcutaneous TID WC  . ipratropium-albuterol  3 mL Inhalation TID  . lisinopril  10 mg Oral Daily  . mouth rinse  15 mL Mouth Rinse BID  . nicotine  21 mg Transdermal Daily  . pantoprazole  40 mg Oral Q1200  . potassium chloride  20 mEq Oral BID  . sodium chloride flush  3 mL Intravenous Q12H   Continuous Infusions: . sodium chloride       LOS: 1 day    Time spent:    Zannie Cove, MD [facility] Page via [email or web address], password [email or web address] After 7PM please contact night-coverage  12/27/2018, 1:31 PM

## 2018-12-28 ENCOUNTER — Encounter (HOSPITAL_COMMUNITY): Payer: Self-pay

## 2018-12-28 LAB — GLUCOSE, CAPILLARY
Glucose-Capillary: 206 mg/dL — ABNORMAL HIGH (ref 70–99)
Glucose-Capillary: 400 mg/dL — ABNORMAL HIGH (ref 70–99)
Glucose-Capillary: 206 mg/dL — ABNORMAL HIGH (ref 70–99)

## 2018-12-28 LAB — BASIC METABOLIC PANEL
Anion gap: 9 (ref 5–15)
BUN: 17 mg/dL (ref 6–20)
CO2: 29 mmol/L (ref 22–32)
Calcium: 8.7 mg/dL — ABNORMAL LOW (ref 8.9–10.3)
Chloride: 99 mmol/L (ref 98–111)
Creatinine, Ser: 0.87 mg/dL (ref 0.61–1.24)
GFR calc Af Amer: >60 mL/min (ref >60)
GFR calc non Af Amer: >60 mL/min (ref >60)
Glucose, Bld: 209 mg/dL — ABNORMAL HIGH (ref 70–99)
Potassium: 3.9 mmol/L (ref 3.5–5.1)
Sodium: 137 mmol/L (ref 135–145)

## 2018-12-28 MED ORDER — FUROSEMIDE 10 MG/ML IJ SOLN
40.0000 mg | Freq: Three times a day (TID) | INTRAMUSCULAR | Status: DC
  Administered 2018-12-28 – 2018-12-29 (×4): 40 mg via INTRAVENOUS
  Filled 2018-12-28 (×4): qty 4

## 2018-12-28 MED ORDER — ALUM & MAG HYDROXIDE-SIMETH 200-200-20 MG/5ML PO SUSP
30.0000 mL | Freq: Four times a day (QID) | ORAL | Status: DC | PRN
  Administered 2018-12-28: 30 mL via ORAL
  Filled 2018-12-28: qty 30

## 2018-12-28 MED ORDER — POTASSIUM CHLORIDE CRYS ER 20 MEQ PO TBCR
20.0000 meq | EXTENDED_RELEASE_TABLET | Freq: Three times a day (TID) | ORAL | Status: DC
  Administered 2018-12-28 – 2018-12-30 (×6): 20 meq via ORAL
  Filled 2018-12-28 (×6): qty 1

## 2018-12-28 NOTE — Consult Note (Signed)
[place] Nurse wound consult note Reason for Consult:Open lesions to penis.  Below meatus on left side. . Recent recurring scrotal and penis swelling.   Wound type: Unknown etiology but recent swelling Pressure Injury POA: NA Measurement: 0.5 cm x 0.2 cm x 0.1 cm Wound YYQ:MGNOIB fibrin Drainage (amount, consistency, odor) none noted  No purulence or erythema.  Patient states is tender to touch.  Periwound:edema to scrotum and penis.  Dressing procedure/placement/frequency:Cleanse penis with soap and water and pat gently dry.  Apply Xeroform gauze over wound to penis.  Change daily.  Will not follow at this time.  Please re-consult if needed.  Domenic Moras MSN, RN, FNP-BC CWON Wound, Ostomy, Continence Nurse Pager [phone]

## 2018-12-28 NOTE — Progress Notes (Signed)
PROGRESS NOTE    Taylor MemosFrederick L [name]  WGN:[id number] DOB: [birth year] DOA: 12/25/2018 PCP: Patient, No Pcp Per  Brief Narrative: 58 year old male with chronic combined systolic and diastolic CHF with EF of 25%, chronic respiratory failure on 2 L home O2, polysubstance abuse hypertension, type 2 diabetes mellitus, schizophrenia, HCV, OSA, morbidly obese and homeless with numerous ER visits for shortness of breath and hospitalization for CHF, has left AGAINST MEDICAL ADVICE on several occasions. -He just left [facility] from [facility], he was admitted with CHF exacerbation, he has also been noted to have intermittent high-grade AV block from which he is asymptomatic however with his noncompliance homelessness and narrow QRS complex not felt to be a good candidate for BiV ICD -back with volume overload  Assessment & Plan:   Acute on chronic systolic (congestive) heart failure (HCC) -Echo with EF of 25%, issues with compliance, substance abuse and homelessness which limits appropriate medical care -Clinically improving with diuresis, he is 2.1 L negative, gently having double portions and double fluid intake will attempt some fluid restriction -Continue lisinopril, no beta-blocker due to recurrent high-grade AV block -continue IV lasix, increase dose to 40 mg 3 times daily  Mobitz type II AV block -Asymptomatic recurrent high-grade AV block noted on telemetry through prior hospitalizations including multiple times last week, was followed by cardiology and seen by EP for this, due to complicated situation given noncompliance, homelessness, and narrow QRS complex not felt to be a good candidate for BiV ICD, based on EP discussion last week -Recommended to avoid AV nodal blocking agents, avoid cocaine and emphasized compliance with medications diuretics  Type 2 diabetes mellitus -Last hemoglobin A1c was 9.7, -Continue Lantus and sliding scale insulin -Stable  Polysubstance abuse/cocaine abuse -Counseled,  agrees to quit understands that it impedes his ability to receive optimal treatment and placement  Schizophrenia -Continue Abilify  Homelessness/noncompliance -Numerous ED visits and hospitalizations this month, this is the seventh hospitalization in June 6, including 9 ED visits -Social work, case management consulted  DVT prophylaxis: Lovenox  code Status: Full code Family Communication: No family at bedside Disposition Plan: disposition unknown pending adequate diuresis  Consultants:      Procedures:   Antimicrobials:    Subjective: -feels ok, complains of his swelling, breathing improving  Objective: Vitals:   12/28/18 0805 12/28/18 0817 12/28/18 0926 12/28/18 0957  BP: (!) 146/77     Pulse: 92  (!) 102   Resp: 17  (!) 22 18  Temp: 98.4 F (36.9 C)     TempSrc: Oral     SpO2: (!) 88% 95% 95%   Weight:      Height:        Intake/Output Summary (Last 24 hours) at 12/28/2018 1112 Last data filed at 12/28/2018 0951 Gross per 24 hour  Intake 2423 ml  Output 3225 ml  Net -802 ml   Filed Weights   12/26/18 0454 12/27/18 0256 12/28/18 0649  Weight: 107.1 kg 107.7 kg 107.4 kg    Examination:  Gen: Obese chronically ill male, sitting up in bed, AAO x2 HEENT: PERRLA, Neck supple, no JVD Lungs: Fine basilar crackles, apparent poor air movement CVS: RRR,No Gallops,Rubs or new Murmurs Abd: Soft, nontender nondistended, umbilical hernia, lateral abdominal wall edema Extremities: 2+ edema, scrotal swelling Skin: Hyperpigmentation of both lower extremities Psychiatry:  Mood & affect appropriate.     Data Reviewed:   CBC: Recent Labs  Lab 12/22/18 0630 12/22/18 1655 12/23/18 0223 12/25/18 1612 12/27/18 0259  WBC 9.3  --  6.6 8.4 5.8  NEUTROABS  --   --  4.6 6.7  --   HGB 8.7* 10.5* 8.4* 8.3* 8.5*  HCT 29.8* 31.0* 29.2* 28.6* 29.3*  MCV 79.3*  --  80.0 79.0* 78.3*  PLT 273  --  251 296 312   Basic Metabolic Panel: Recent Labs  Lab 12/25/18 0323  12/25/18 1612 12/26/18 0314 12/27/18 0259 12/28/18 0524  NA 139 138 139 137 137  K 4.0 3.8 3.5 3.8 3.9  CL 101 99 100 100 99  CO2 GLUCOSE 196* 231* 159* 170* 209*  BUN CREATININE 0.85 1.05 0.87 0.88 0.87  CALCIUM 8.8* 8.6* 8.6* 8.7* 8.7*   GFR: Estimated Creatinine Clearance: 113.2 mL/min (by C-G formula based on SCr of 0.87 mg/dL). Liver Function Tests: Recent Labs  Lab 12/25/18 1612  AST 22  ALT 15  ALKPHOS 181*  BILITOT 0.6  PROT 6.8  ALBUMIN 2.8*   Recent Labs  Lab 12/25/18 1612  LIPASE 25   No results for input(s): AMMONIA in the last 168 hours. Coagulation Profile: No results for input(s): INR, PROTIME in the last 168 hours. Cardiac Enzymes: No results for input(s): CKTOTAL, CKMB, CKMBINDEX, TROPONINI in the last 168 hours. BNP (last 3 results) No results for input(s): PROBNP in the last 8760 hours. HbA1C: No results for input(s): HGBA1C in the last 72 hours. CBG: Recent Labs  Lab 12/27/18 0606 12/27/18 1132 12/27/18 1617 12/27/18 2126 12/28/18 0607  GLUCAP 163* 226* 174* 218* 206*   Lipid Profile: No results for input(s): CHOL, HDL, LDLCALC, TRIG, CHOLHDL, LDLDIRECT in the last 72 hours. Thyroid Function Tests: No results for input(s): TSH, T4TOTAL, FREET4, T3FREE, THYROIDAB in the last 72 hours. Anemia Panel: No results for input(s): VITAMINB12, FOLATE, FERRITIN, TIBC, IRON, RETICCTPCT in the last 72 hours. Urine analysis:    Component Value Date/Time   COLORURINE YELLOW 12/18/2018 2353   APPEARANCEUR CLEAR 12/18/2018 2353   LABSPEC 1.016 12/18/2018 2353   PHURINE 7.0 12/18/2018 2353   GLUCOSEU NEGATIVE 12/18/2018 2353   HGBUR NEGATIVE 12/18/2018 2353   BILIRUBINUR NEGATIVE 12/18/2018 2353   KETONESUR NEGATIVE 12/18/2018 2353   PROTEINUR 100 (A) 12/18/2018 2353   UROBILINOGEN 1.0 03/14/2015 0610   NITRITE NEGATIVE 12/18/2018 2353   LEUKOCYTESUR NEGATIVE 12/18/2018 2353   Sepsis Labs:  (procalcitonin:4,lacticidven:4)  ) Recent Results (from the past 240 hour(s))  SARS Coronavirus 2 (CEPHEID- Performed in [facility] hospital lab), Hosp Order     Status: None   Collection Time: 12/19/18  1:37 AM   Specimen: Nasopharyngeal Swab  Result Value Ref Range Status   SARS Coronavirus 2 NEGATIVE NEGATIVE Final    Comment: (NOTE) If result is NEGATIVE SARS-CoV-2 target nucleic acids are NOT DETECTED. The SARS-CoV-2 RNA is generally detectable in upper and lower  respiratory specimens during the acute phase of infection. The lowest  concentration of SARS-CoV-2 viral copies this assay can detect is 250  copies / mL. A negative result does not preclude SARS-CoV-2 infection  and should not be used as the sole basis for treatment or other  patient management decisions.  A negative result may occur with  improper specimen collection / handling, submission of specimen other  than nasopharyngeal swab, presence of viral mutation(s) within the  areas targeted by this assay, and inadequate number of viral copies  (<250 copies / mL). A negative result must be combined with clinical  observations, patient history, and epidemiological information. If  result is POSITIVE SARS-CoV-2 target nucleic acids are DETECTED. The SARS-CoV-2 RNA is generally detectable in upper and lower  respiratory specimens dur ing the acute phase of infection.  Positive  results are indicative of active infection with SARS-CoV-2.  Clinical  correlation with patient history and other diagnostic information is  necessary to determine patient infection status.  Positive results do  not rule out bacterial infection or co-infection with other viruses. If result is PRESUMPTIVE POSTIVE SARS-CoV-2 nucleic acids MAY BE PRESENT.   A presumptive positive result was obtained on the submitted specimen  and confirmed on repeat testing.  While 2019 novel coronavirus  (SARS-CoV-2) nucleic acids may be present in the  submitted sample  additional confirmatory testing may be necessary for epidemiological  and / or clinical management purposes  to differentiate between  SARS-CoV-2 and other Sarbecovirus currently known to infect humans.  If clinically indicated additional testing with an alternate test  methodology [phone]([id number]) is advised. The SARS-CoV-2 RNA is generally  detectable in upper and lower respiratory sp ecimens during the acute  phase of infection. The expected result is Negative. Fact Sheet for Patients:  BoilerBrush.com.cyhttps://www.fda.gov/media/136312/download Fact Sheet for Healthcare Providers: https://pope.com/https://www.fda.gov/media/136313/download This test is not yet approved or cleared by the [place] FDA and has been authorized for detection and/or diagnosis of SARS-CoV-2 by FDA under an Emergency Use Authorization (EUA).  This EUA will remain in effect (meaning this test can be used) for the duration of the COVID-19 declaration under Section 564(b)(1) of the Act, 21 U.S.C. section 360bbb-3(b)(1), unless the authorization is terminated or revoked sooner. Performed at [facility], [address] [place], [place] [address]   SARS Coronavirus 2 (CEPHEID - Performed in [facility] hospital lab), Hosp Order     Status: None   Collection Time: 12/22/18  8:32 AM   Specimen: Nasopharyngeal Swab  Result Value Ref Range Status   SARS Coronavirus 2 NEGATIVE NEGATIVE Final    Comment: (NOTE) If result is NEGATIVE SARS-CoV-2 target nucleic acids are NOT DETECTED. The SARS-CoV-2 RNA is generally detectable in upper and lower  respiratory specimens during the acute phase of infection. The lowest  concentration of SARS-CoV-2 viral copies this assay can detect is 250  copies / mL. A negative result does not preclude SARS-CoV-2 infection  and should not be used as the sole basis for treatment or other  patient management decisions.  A negative result may occur with  improper specimen collection / handling,  submission of specimen other  than nasopharyngeal swab, presence of viral mutation(s) within the  areas targeted by this assay, and inadequate number of viral copies  (<250 copies / mL). A negative result must be combined with clinical  observations, patient history, and epidemiological information. If result is POSITIVE SARS-CoV-2 target nucleic acids are DETECTED. The SARS-CoV-2 RNA is generally detectable in upper and lower  respiratory specimens dur ing the acute phase of infection.  Positive  results are indicative of active infection with SARS-CoV-2.  Clinical  correlation with patient history and other diagnostic information is  necessary to determine patient infection status.  Positive results do  not rule out bacterial infection or co-infection with other viruses. If result is PRESUMPTIVE POSTIVE SARS-CoV-2 nucleic acids MAY BE PRESENT.   A presumptive positive result was obtained on the submitted specimen  and confirmed on repeat testing.  While 2019 novel coronavirus  (SARS-CoV-2) nucleic acids may be present in the submitted sample  additional confirmatory testing may be necessary for  epidemiological  and / or clinical management purposes  to differentiate between  SARS-CoV-2 and other Sarbecovirus currently known to infect humans.  If clinically indicated additional testing with an alternate test  methodology [phone]([id number]) is advised. The SARS-CoV-2 RNA is generally  detectable in upper and lower respiratory sp ecimens during the acute  phase of infection. The expected result is Negative. Fact Sheet for Patients:  BoilerBrush.com.cyhttps://www.fda.gov/media/136312/download Fact Sheet for Healthcare Providers: https://pope.com/https://www.fda.gov/media/136313/download This test is not yet approved or cleared by the [place] FDA and has been authorized for detection and/or diagnosis of SARS-CoV-2 by FDA under an Emergency Use Authorization (EUA).  This EUA will remain in effect (meaning this test can be  used) for the duration of the COVID-19 declaration under Section 564(b)(1) of the Act, 21 U.S.C. section 360bbb-3(b)(1), unless the authorization is terminated or revoked sooner. Performed at [facility], [address] [place], [place] [address]   Surgical PCR screen     Status: None   Collection Time: 12/22/18  6:57 PM   Specimen: Nasal Mucosa; Nasal Swab  Result Value Ref Range Status   MRSA, PCR NEGATIVE NEGATIVE Final   Staphylococcus aureus NEGATIVE NEGATIVE Final    Comment: (NOTE) The Xpert SA Assay (FDA approved for NASAL specimens in patients 58 years of age and older), is one component of a comprehensive surveillance program. It is not intended to diagnose infection nor to guide or monitor treatment. Performed at [facility], [address] [place], [place] [address]   SARS Coronavirus 2 (CEPHEID - Performed in [facility] hospital lab), Hosp Order     Status: None   Collection Time: 12/25/18  8:17 PM   Specimen: Nasopharyngeal Swab  Result Value Ref Range Status   SARS Coronavirus 2 NEGATIVE NEGATIVE Final    Comment: (NOTE) If result is NEGATIVE SARS-CoV-2 target nucleic acids are NOT DETECTED. The SARS-CoV-2 RNA is generally detectable in upper and lower  respiratory specimens during the acute phase of infection. The lowest  concentration of SARS-CoV-2 viral copies this assay can detect is 250  copies / mL. A negative result does not preclude SARS-CoV-2 infection  and should not be used as the sole basis for treatment or other  patient management decisions.  A negative result may occur with  improper specimen collection / handling, submission of specimen other  than nasopharyngeal swab, presence of viral mutation(s) within the  areas targeted by this assay, and inadequate number of viral copies  (<250 copies / mL). A negative result must be combined with clinical  observations, patient history, and epidemiological information. If result is  POSITIVE SARS-CoV-2 target nucleic acids are DETECTED. The SARS-CoV-2 RNA is generally detectable in upper and lower  respiratory specimens dur ing the acute phase of infection.  Positive  results are indicative of active infection with SARS-CoV-2.  Clinical  correlation with patient history and other diagnostic information is  necessary to determine patient infection status.  Positive results do  not rule out bacterial infection or co-infection with other viruses. If result is PRESUMPTIVE POSTIVE SARS-CoV-2 nucleic acids MAY BE PRESENT.   A presumptive positive result was obtained on the submitted specimen  and confirmed on repeat testing.  While 2019 novel coronavirus  (SARS-CoV-2) nucleic acids may be present in the submitted sample  additional confirmatory testing may be necessary for epidemiological  and / or clinical management purposes  to differentiate between  SARS-CoV-2 and other Sarbecovirus currently known to infect humans.  If clinically  indicated additional testing with an alternate test  methodology [phone]) is advised. The SARS-CoV-2 RNA is generally  detectable in upper and lower respiratory sp ecimens during the acute  phase of infection. The expected result is Negative. Fact Sheet for Patients:  StrictlyIdeas.no Fact Sheet for Healthcare Providers: BankingDealers.co.za This test is not yet approved or cleared by the [place] FDA and has been authorized for detection and/or diagnosis of SARS-CoV-2 by FDA under an Emergency Use Authorization (EUA).  This EUA will remain in effect (meaning this test can be used) for the duration of the COVID-19 declaration under Section 564(b)(1) of the Act, 21 U.S.C. section 360bbb-3(b)(1), unless the authorization is terminated or revoked sooner. Performed at [facility], [place] [address], [place], [place] [address]          Radiology Studies: No results found.       Scheduled Meds: . ARIPiprazole  10 mg Oral Daily  . aspirin EC  81 mg Oral Daily  . carbamazepine  100 mg Oral BID  . enoxaparin (LOVENOX) injection  40 mg Subcutaneous Q24H  . furosemide  40 mg Intravenous Q8H  . insulin aspart  0-5 Units Subcutaneous QHS  . insulin aspart  0-9 Units Subcutaneous TID WC  . ipratropium-albuterol  3 mL Inhalation TID  . lisinopril  10 mg Oral Daily  . mouth rinse  15 mL Mouth Rinse BID  . nicotine  21 mg Transdermal Daily  . pantoprazole  40 mg Oral Q1200  . potassium chloride  20 mEq Oral TID  . sodium chloride flush  3 mL Intravenous Q12H   Continuous Infusions: . sodium chloride       LOS: 2 days    Time spent: 81min    Domenic Polite, MD [facility]  12/28/2018, 11:12 AM

## 2018-12-28 NOTE — Progress Notes (Signed)
Pt called nurse " I need to talk to the doctor about going home. My cousin going to get me a hotel room and help with my meds" I educated we increased his iv lasix and still sob and ra 80's with amb, pt still says he is wanting to go to home and wants to talk to the doctor. Paged dr Riley Nearing

## 2018-12-28 NOTE — Progress Notes (Signed)
CCMD called to report pt brady down and av block, nurse was outside room pt was asleep, easily awakened, pt was brief and back to st90-100, telemetry review does show brief av bloock ? 2nd degree type 2, paged Dr Broadus John to advise, per CCMD pt has had epidsode and per report pt had last night briefly while sleeping, Dr Broadus John reports pt has had long term and cardiology has seen pt and to monitior, no betablockers and not candidate for PM

## 2018-12-28 NOTE — TOC Progression Note (Addendum)
Transition of Care [facility]) - Progression Note    Patient Details  Name: [name] MRN: [id number] Date of Birth: 08-12-1960  [facility] CM/SW Contact  Candie Chroman, LCSW Phone Number: 12/28/2018, 1:55 PM  Clinical Narrative: Met with patient to discuss discharge planning. CSW was up front with patient about barriers to SNF placement including active substance use, homelessness, and frequently leaving AMA. Patient stated someone had to be crazy to leave a facility if they got into one. CSW explained that it happens every day. He is aware he will lose his check minus about $30. CSW is willing to send referral out to see if he gets any offers but told patient repeatedly it could not be guaranteed. Patient said multiple times that he had already been here a long time and that he was supposed to get his check on Friday. Will review PT evaluation prior to sending out SNF referral. Patient declined PT today. CSW told him during discussion that he has to work with them before anything can be started. Patient asked about group home and [facility] placement. He has been to an [employer] in the past. Patient is willing to call these facilities himself. CSW explained that we cannot place him in a group home from the hospital because [facility] has to facilitate that and per multiple ED CSW notes, they discharged him. CSW left list of [place] group homes and [facility] in the room for patient to review.   Expected Discharge Plan: Group Home Barriers to Discharge: Other (comment)(Securing a group home)  Expected Discharge Plan and Services Expected Discharge Plan: [place] arrangements for the past 2 months: Homeless                                       Social Determinants of Health (SDOH) Interventions    Readmission Risk Interventions Readmission Risk Prevention Plan 12/28/2018 12/18/2018 10/05/2018  Transportation Screening Complete  Complete Complete  Medication Review [employer]) - Complete Complete  PCP or Specialist appointment within 3-5 days of discharge - Complete Complete  HRI or [place] - Complete Patient refused  SW Recovery Care/Counseling Consult Complete Complete Complete  Palliative Care Screening Not Applicable Not Applicable Not [place] - Not Applicable Not Applicable  Some recent data might be hidden

## 2018-12-28 NOTE — Plan of Care (Signed)
  Problem: Clinical Measurements: Goal: Will remain free from infection Outcome: Completed/Met   Problem: Elimination: Goal: Will not experience complications related to urinary retention Outcome: Completed/Met   

## 2018-12-28 NOTE — Plan of Care (Signed)
  Problem: Safety: Goal: Ability to remain free from injury will improve Outcome: Progressing   Problem: Activity: Goal: Capacity to carry out activities will improve Outcome: Progressing   Problem: Education: Goal: Knowledge of General Education information will improve Description: Including pain rating scale, medication(s)/side effects and non-pharmacologic comfort measures Outcome: Not Met (add Reason) Pt. Non compliant with fluid restriction and continues to request more fluids after education.

## 2018-12-28 NOTE — Progress Notes (Signed)
PT Cancellation Note  Patient Details Name: [name] MRN: [id number] DOB: 21-Feb-1961   Cancelled Treatment:    Reason Eval/Treat Not Completed: Patient declined, no reason specified(pt reports fatigue and inability to attempt mobility. Polite and states he will attempt next date. Pt on RA on arrival with SpO2 80% with 2L applied and sats increased to 91% with 2 min and cues for breathing technique with pt educated for need for O2)   [name] B Dragan Tamburrino 12/28/2018, 1:48 PM  Kristy Schomburg Pam Drown, PT Acute Rehabilitation Services Pager: [phone] Office: [phone]

## 2018-12-29 LAB — BASIC METABOLIC PANEL
Anion gap: 7 (ref 5–15)
BUN: 14 mg/dL (ref 6–20)
CO2: 31 mmol/L (ref 22–32)
Calcium: 8.7 mg/dL — ABNORMAL LOW (ref 8.9–10.3)
Chloride: 98 mmol/L (ref 98–111)
Creatinine, Ser: 0.85 mg/dL (ref 0.61–1.24)
GFR calc Af Amer: >60 mL/min (ref >60)
GFR calc non Af Amer: >60 mL/min (ref >60)
Glucose, Bld: 200 mg/dL — ABNORMAL HIGH (ref 70–99)
Potassium: 3.9 mmol/L (ref 3.5–5.1)
Sodium: 136 mmol/L (ref 135–145)

## 2018-12-29 LAB — GLUCOSE, CAPILLARY
Glucose-Capillary: 225 mg/dL — ABNORMAL HIGH (ref 70–99)
Glucose-Capillary: 173 mg/dL — ABNORMAL HIGH (ref 70–99)
Glucose-Capillary: 241 mg/dL — ABNORMAL HIGH (ref 70–99)
Glucose-Capillary: 181 mg/dL — ABNORMAL HIGH (ref 70–99)

## 2018-12-29 MED ORDER — FUROSEMIDE 10 MG/ML IJ SOLN
60.0000 mg | Freq: Three times a day (TID) | INTRAMUSCULAR | Status: DC
  Administered 2018-12-29 – 2018-12-30 (×2): 60 mg via INTRAVENOUS
  Filled 2018-12-29 (×2): qty 6

## 2018-12-29 NOTE — Progress Notes (Signed)
Pt says he spoke to [facility] who said that if pt could get $100 to them as down payment then he could come stay there and they would come get him. He wanted the doctor and social worker to know. I advised he really needed to wait until at least tomorrow to try to get some of that fluid off " I know you are right. My belly is just full and I need to get better.

## 2018-12-29 NOTE — Evaluation (Signed)
Occupational Therapy Evaluation Patient Details Name: [name] MRN: [id number] DOB: 05-11-1961 Today's Date: 12/29/2018    History of Present Illness 58 y.o. male admitted with CHF 6/26 (7th admission in June alone as pt repeatedly leaves AMA) with medical history significant of chronic combined CHF; OSA; Mobitz type II AV block, schizophrenia; polysubstance abuse; HTN; homelessness; hep C; and DM   Clinical Impression   PTA patient reports needing assist with LB self care, using walker for mobility; homeless. Admitted for above, and limited by problem list below, including cognition, pain, generalized weakness, decreased activity tolerance, safety awareness and balance. Requires min guard to supervision for transfers for safety using RW, min-mod assist for LB ADLs and min guard for grooming standing at sink.  He requires assist with oxygen line mgmt throughout session, on 2L supplemental via [place] with saturations maintained 92-94% with cueing for pursed lip breathing.  Patient became agitated toward end of session due to discomfort in scrotum due to swelling- discussing possible scrotal sling with MD.  Will follow while admitted, recommend continued OT services at SNF in order to maximize independence with ADls and mobility.     Follow Up Recommendations  SNF;Supervision/Assistance - 24 hour    Equipment Recommendations  None recommended by OT    Recommendations for Other Services       Precautions / Restrictions Precautions Precautions: Fall Restrictions Weight Bearing Restrictions: No      Mobility Bed Mobility               General bed mobility comments: OOB in recliner upon entry  Transfers Overall transfer level: Needs assistance Equipment used: Rolling walker (2 wheeled) Transfers: Sit to/from Stand Sit to Stand: Supervision;Min guard         General transfer comment: min guard to supervision for safety, cueing for hand placement and safety; impulsive  at times     Balance Overall balance assessment: History of Falls;Needs assistance Sitting-balance support: No upper extremity supported;Feet supported Sitting balance-Leahy Scale: Good     Standing balance support: Bilateral upper extremity supported;During functional activity;No upper extremity supported Standing balance-Leahy Scale: Poor Standing balance comment: able to complete grooming without UE support but reliant on RW for mobility                            ADL either performed or assessed with clinical judgement   ADL Overall ADL's : Needs assistance/impaired     Grooming: Min guard;Standing;Wash/dry hands;Oral care   Upper Body Bathing: Set up;Sitting   Lower Body Bathing: Minimal assistance;Sit to/from stand   Upper Body Dressing : Set up;Sitting   Lower Body Dressing: Moderate assistance;Sit to/from stand Lower Body Dressing Details (indicate cue type and reason): assist required for socks, min guard sit<>stand  Toilet Transfer: Min guard;Ambulation;RW;BSC   Toileting- ArchitectClothing Manipulation and Hygiene: Min guard;Sit to/from stand       Functional mobility during ADLs: Min guard;Rolling walker General ADL Comments: pt limited by pain, decreased activity tolerance, balance      Vision         Perception     Praxis      Pertinent Vitals/Pain Pain Assessment: Faces Faces Pain Scale: Hurts whole lot Pain Location: scrotum Pain Descriptors / Indicators: Sore;Aching Pain Intervention(s): Limited activity within patient's tolerance;Monitored during session;Repositioned     Hand Dominance Right   Extremity/Trunk Assessment Upper Extremity Assessment Upper Extremity Assessment: Generalized weakness   Lower Extremity Assessment Lower Extremity  Assessment: Defer to PT evaluation   Cervical / Trunk Assessment Cervical / Trunk Assessment: Normal   Communication Communication Communication: No difficulties   Cognition Arousal/Alertness:  Awake/alert Behavior During Therapy: WFL for tasks assessed/performed(becomes agitated towards end of session due to pain ) Overall Cognitive Status: History of cognitive impairments - at baseline Area of Impairment: Memory;Problem solving;Safety/judgement;Attention;Following commands;Awareness                   Current Attention Level: Selective Memory: Decreased recall of precautions;Decreased short-term memory Following Commands: Follows one step commands consistently;Follows one step commands with increased time Safety/Judgement: Decreased awareness of safety;Decreased awareness of deficits Awareness: Emergent Problem Solving: Slow processing;Requires verbal cues General Comments: pt becomes agitated towards end of session due to discomfort in scrotum with mobility and self care tasks, follows commands with increased time     General Comments  VSS on 2L supplemental oxgyen via [place] 90-92%    Exercises     Shoulder Instructions      Home Living Family/patient expects to be discharged to:: Shelter/Homeless                                 Additional Comments: homeless, multiple falls      Prior Functioning/Environment Level of Independence: Independent with assistive device(s);Needs assistance  Gait / Transfers Assistance Needed: uses RW for mobility  ADL's / Homemaking Assistance Needed: reports he needs assist with LB self care             OT Problem List: Decreased activity tolerance;Impaired balance (sitting and/or standing);Decreased safety awareness;Decreased knowledge of use of DME or AE;Cardiopulmonary status limiting activity;Obesity;Pain;Increased edema;Decreased strength      OT Treatment/Interventions: Self-care/ADL training;Energy conservation;DME and/or AE instruction;Therapeutic activities;Patient/family education;Balance training;Therapeutic exercise    OT Goals(Current goals can be found in the care plan section) Acute Rehab OT  Goals Patient Stated Goal: have less pain OT Goal Formulation: With patient Time For Goal Achievement: 01/12/19 Potential to Achieve Goals: Fair  OT Frequency: Min 2X/week   Barriers to D/C: Inaccessible home environment;Decreased caregiver support          Co-evaluation              AM-PAC OT "6 Clicks" Daily Activity     Outcome Measure Help from another person eating meals?: None Help from another person taking care of personal grooming?: A Little Help from another person toileting, which includes using toliet, bedpan, or urinal?: A Little Help from another person bathing (including washing, rinsing, drying)?: A Little Help from another person to put on and taking off regular upper body clothing?: None Help from another person to put on and taking off regular lower body clothing?: A Lot 6 Click Score: 19   End of Session Equipment Utilized During Treatment: Rolling walker;Gait belt;Oxygen Nurse Communication: Mobility status  Activity Tolerance: Patient tolerated treatment well Patient left: in chair;with call bell/phone within reach;with chair alarm set;with nursing/sitter in room  OT Visit Diagnosis: Unsteadiness on feet (R26.81);Other abnormalities of gait and mobility (R26.89);Pain;History of falling (Z91.81);Muscle weakness (generalized) (M62.81) Pain - part of body: (scrotum)                Time: [id number] OT Time Calculation (min): 22 min Charges:  OT General Charges $OT Visit: 1 Visit OT Evaluation $OT Eval Moderate Complexity: 1 Mod  Chancy Milroyhristie S Akemi Overholser, OT Acute Rehabilitation Services Pager [phone] Office [phone]   Chancy Milroyhristie S Chaze Hruska  12/29/2018, 4:35 PM

## 2018-12-29 NOTE — TOC Progression Note (Signed)
Transition of Care [facility]) - Progression Note    Patient Details  Name: [name] MRN: [id number] Date of Birth: [birth year]  Transition of Care [name]) CM/SW [place], LCSW Phone Number: 12/29/2018, 4:16 PM  Clinical Narrative: Patient has no bed offers at this time. 18 denials and 16 still pending.  Expected Discharge Plan: Group Home Barriers to Discharge: Other (comment)(Securing a group home)  Expected Discharge Plan and Services Expected Discharge Plan: [place] arrangements for the past 2 months: Homeless                                       Social Determinants of Health (SDOH) Interventions    Readmission Risk Interventions Readmission Risk Prevention Plan 12/28/2018 12/18/2018 10/05/2018  Transportation Screening Complete Complete Complete  Medication Review Press photographer) - Complete Complete  PCP or Specialist appointment within 3-5 days of discharge - Complete Complete  HRI or [place] - Complete Patient refused  SW Recovery Care/Counseling Consult Complete Complete Complete  Palliative Care Screening Not Applicable Not Applicable Not [place] - Not Applicable Not Applicable  Some recent data might be hidden

## 2018-12-29 NOTE — NC FL2 (Signed)
[place] MEDICAID FL2 LEVEL OF CARE SCREENING TOOL     IDENTIFICATION  Patient Name: [name] Birthdate: 03/23/1961 Sex: male Admission Date (Current Location): 12/25/2018  [facility] [place]   Facility and Address:         Provider Number: [id number]  Attending Physician Name and Address:  Zannie CoveJoseph, Preetha, MD  Relative Name and Phone Number:       Current Level of Care: Hospital Recommended Level of Care: Skilled Nursing Facility Prior Approval Number:    Date Approved/Denied:   PASRR Number: Pending  Discharge Plan: SNF    Current Diagnoses: Patient Active Problem List   Diagnosis Date Noted  . Acute on chronic systolic heart failure (HCC) 12/26/2018  . Mobitz type II atrioventricular block 12/25/2018  . Acute exacerbation of CHF (congestive heart failure) (HCC) 12/22/2018  . Acute on chronic respiratory failure with hypoxia (HCC) 12/22/2018  . CHF exacerbation (HCC) 12/19/2018  . Acute CHF (congestive heart failure) (HCC) 12/16/2018  . Diabetes mellitus without complication (HCC) 12/11/2018  . Acute on chronic systolic (congestive) heart failure (HCC) 12/11/2018  . Abdominal pain 12/11/2018  . Nausea vomiting and diarrhea 12/11/2018  . Anemia 12/11/2018  . Evaluation by psychiatric service required   . MDD (major depressive disorder), severe (HCC) 11/25/2018  . Pressure injury of skin 11/08/2018  . Elevated troponin 10/18/2018  . HLD (hyperlipidemia) 10/18/2018  . Anxiety 10/18/2018  . CHF (congestive heart failure), NYHA class II, acute on chronic, combined (HCC) 10/18/2018  . Hypertensive urgency 10/12/2018  . Mild renal insufficiency 10/12/2018  . Cellulitis 10/12/2018  . Penile cellulitis   . Adjustment disorder with mixed disturbance of emotions and conduct   . Sleep apnea 10/03/2018  . Hypokalemia 09/17/2018  . Scrotal swelling 09/17/2018  . Urinary hesitancy   . Constipation   . Acute on chronic combined  systolic and diastolic CHF (congestive heart failure) (HCC) 08/25/2018  . Tobacco use 08/18/2018  . Homelessness 08/08/2018  . Smoker 08/08/2018  . Prostate enlargement 03/16/2018  . Aortic atherosclerosis (HCC) 03/16/2018  . 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 Chronic foot pain   . Schizoaffective disorder, bipolar type (HCC) 09/30/2016  . Substance induced mood disorder (HCC) 03/13/2015  . Schizophrenia, paranoid type (HCC) 01/17/2015  . Drug hallucinosis (HCC) 10/08/2014  . Chronic paranoid schizophrenia (HCC) 09/07/2014  . 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 Urinary retention   . Cocaine use disorder, severe, dependence (HCC)   . Essential hypertension, benign 03/28/2013  . Insulin-requiring or dependent type II diabetes mellitus (HCC) 03/15/2013    Orientation RESPIRATION BLADDER Height & Weight     Self, Time, Situation, Place  O2(Nasal Canula 2.5 L) Continent Weight: 238 lb 14.4 oz (108.4 kg)(scale b) Height:  5\' 9"  (175.3 cm)  BEHAVIORAL SYMPTOMS/MOOD NEUROLOGICAL BOWEL NUTRITION STATUS  (None) (None) Continent Diet(2 gram sodium.)  AMBULATORY STATUS COMMUNICATION OF NEEDS Skin   Limited Assist Verbally Other (Comment)(Blister, Cracking, Fissure, MASD. Skin tear on right, left, circumferential penis: Inpregnated gauze (Bismuth) daily.)                       Personal Care Assistance Level of Assistance  Bathing, Dressing, Feeding Bathing Assistance: Limited assistance Feeding assistance: Independent Dressing Assistance: Limited assistance     Functional Limitations Info  Sight, Hearing, Speech Sight Info: Adequate Hearing Info: Adequate Speech Info: Adequate    SPECIAL CARE FACTORS FREQUENCY  PT (By licensed PT), OT (By licensed OT)     PT Frequency: 5 x week OT Frequency: 5 x week            Contractures Contractures Info: Not present    Additional Factors  Info  Code Status, Allergies, Psychotropic Code Status Info: Full code Allergies Info: Haldol (Haloperidol). Psychotropic Info: Paranoid Schizophrenia: Abilify 10 mg PO daily, Tegretol 100 mg PO BID.         Current Medications (12/29/2018):  This is the current hospital active medication list Current Facility-Administered Medications  Medication Dose Route Frequency Provider Last Rate Last Dose  . 0.9 %  sodium chloride infusion  250 mL Intravenous PRN Lorretta HarpNiu, Xilin, MD      . acetaminophen (TYLENOL) tablet 650 mg  650 mg Oral Q4H PRN Lorretta HarpNiu, Xilin, MD   650 mg at 12/29/18 [id number]  . albuterol (PROVENTIL) (2.5 MG/3ML) 0.083% nebulizer solution 3 mL  3 mL Inhalation Q6H PRN Lorretta HarpNiu, Xilin, MD   3 mL at 12/27/18 0319  . alum & mag hydroxide-simeth (MAALOX/MYLANTA) 200-200-20 MG/5ML suspension 30 mL  30 mL Oral Q6H PRN Zannie CoveJoseph, Preetha, MD   30 mL at 12/28/18 1828  . ARIPiprazole (ABILIFY) tablet 10 mg  10 mg Oral Daily Lorretta HarpNiu, Xilin, MD   10 mg at 12/29/18 [id number]  . aspirin EC tablet 81 mg  81 mg Oral Daily Lorretta HarpNiu, Xilin, MD   81 mg at 12/29/18 [id number]  . carbamazepine (TEGRETOL) tablet 100 mg  100 mg Oral BID Lorretta HarpNiu, Xilin, MD   100 mg at 12/29/18 [id number]  . dextromethorphan-guaiFENesin (MUCINEX DM) 30-600 MG per 12 hr tablet 1 tablet  1 tablet Oral BID PRN Lorretta HarpNiu, Xilin, MD      . enoxaparin (LOVENOX) injection 40 mg  40 mg Subcutaneous Q24H Lorretta HarpNiu, Xilin, MD   40 mg at 12/28/18 2137  . furosemide (LASIX) injection 40 mg  40 mg Intravenous Blair HeysQ8H Joseph, Preetha, MD   40 mg at 12/29/18 [id number]  . hydrALAZINE (APRESOLINE) injection 5 mg  5 mg Intravenous Q2H PRN Lorretta HarpNiu, Xilin, MD      . insulin aspart (novoLOG) injection 0-5 Units  0-5 Units Subcutaneous QHS Lorretta HarpNiu, Xilin, MD   2 Units at 12/28/18 2154  . insulin aspart (novoLOG) injection 0-9 Units  0-9 Units Subcutaneous TID WC Lorretta HarpNiu, Xilin, MD   2 Units at 12/29/18 1221  . ipratropium-albuterol (DUONEB) 0.5-2.5 (3) MG/3ML nebulizer solution 3 mL  3 mL Inhalation TID Zannie CoveJoseph, Preetha, MD   3  mL at 12/29/18 0854  . lisinopril (ZESTRIL) tablet 10 mg  10 mg Oral Daily Lorretta HarpNiu, Xilin, MD   10 mg at 12/29/18 [id number]  . loperamide (IMODIUM) capsule 2 mg  2 mg Oral PRN Zannie CoveJoseph, Preetha, MD   2 mg at 12/26/18 2120  . MEDLINE mouth rinse  15 mL Mouth Rinse BID Lorretta HarpNiu, Xilin, MD   15 mL at 12/28/18 1454  . nicotine (NICODERM CQ - dosed in mg/24 hours) patch 21 mg  21 mg Transdermal Daily Lorretta HarpNiu, Xilin, MD   21 mg at 12/29/18 [id number]  . ondansetron (ZOFRAN) injection 4 mg  4 mg Intravenous Q6H PRN Lorretta HarpNiu, Xilin, MD      . pantoprazole (PROTONIX) EC tablet 40 mg  40 mg Oral Q1200 Lorretta HarpNiu, Xilin, MD   40 mg at 12/28/18 2136  . potassium chloride SA (K-DUR) CR tablet 20 mEq  20 mEq Oral TID Zannie CoveJoseph, Preetha, MD   20 mEq at 12/29/18 0924  . sodium chloride flush (  NS) 0.9 % injection 3 mL  3 mL Intravenous Q12H Ivor Costa, MD   3 mL at 12/29/18 0924  . sodium chloride flush (NS) 0.9 % injection 3 mL  3 mL Intravenous PRN Ivor Costa, MD      . zolpidem Lorrin Mais) tablet 5 mg  5 mg Oral QHS PRN Domenic Polite, MD   5 mg at 12/28/18 2136     Discharge Medications: Please see discharge summary for a list of discharge medications.  Relevant Imaging Results:  Relevant Lab Results:   Additional Information SS#: [id number]  Candie Chroman, LCSW

## 2018-12-29 NOTE — Evaluation (Signed)
Physical Therapy Evaluation Patient Details Name: [name] MRN: [id number] DOB: Jun 10, 1961 Today's Date: 12/29/2018   History of Present Illness  58 y.o. male admitted with CHF 6/26 (7th admission in June alone as pt repeatedly leaves AMA) with medical history significant of chronic combined CHF; OSA; Mobitz type II AV block, schizophrenia; polysubstance abuse; HTN; homelessness; hep C; and DM  Clinical Impression  Pt very pleasant and willing to participate this morning. Pt very familiar from prior admissions and much more calm and cooperative this morning then on prior occasions. Pt with ability to walk in hall with assist of RW and perform limited HEP. Pt with decreased cognition, problem solving, safety awareness, decreased strength, transfers, balance and mobility who will benefit from acute therapy to maximize mobility, function and independence to decrease burden of care and fall risk. Pt encouraged to be OOb for meals and continue HEP as well as walk with nursing assist with RW.   On arrival pt with [place] on but not connected to wall with O2 replaced 95% on 2L  Drop to 83% on 2L with gait and required 4L to maintain 92% with gait HR 105-112    Follow Up Recommendations SNF    Equipment Recommendations  None recommended by PT    Recommendations for Other Services       Precautions / Restrictions Precautions Precautions: Fall      Mobility  Bed Mobility               General bed mobility comments: in chair on arrival and end of session  Transfers Overall transfer level: Needs assistance   Transfers: Sit to/from Stand Sit to Stand: Supervision         General transfer comment: cues for hand placement and safety, pt at times moves impulsive without regard to table or obstacles. Also spilling urinal without concern during transfer  Ambulation/Gait Ambulation/Gait assistance: Min guard Gait Distance (Feet): 160 Feet Assistive device: Rolling walker (2  wheeled) Gait Pattern/deviations: Step-through pattern;Wide base of support;Trunk flexed;Shuffle   Gait velocity interpretation: >2.62 ft/sec, indicative of community ambulatory General Gait Details: cues for posture and position in RW with wide BOS due to edema  Stairs            Wheelchair Mobility    Modified Rankin (Stroke Patients Only)       Balance Overall balance assessment: History of Falls;Needs assistance   Sitting balance-Leahy Scale: Good Sitting balance - Comments: pt able to sit Eob with good balance, sits at edge due to scrotal edema   Standing balance support: Bilateral upper extremity supported Standing balance-Leahy Scale: Poor Standing balance comment: reliant on the RW for dynamic mobility                             Pertinent Vitals/Pain Pain Score: 6  Pain Location: scrotum Pain Descriptors / Indicators: Sore;Aching Pain Intervention(s): Limited activity within patient's tolerance;Repositioned;Monitored during session    Home Living Family/patient expects to be discharged to:: Shelter/Homeless                 Additional Comments: homeless, multiple falls    Prior Function Level of Independence: Independent with assistive device(s)         Comments: uses RW     Hand Dominance        Extremity/Trunk Assessment   Upper Extremity Assessment Upper Extremity Assessment: Generalized weakness    Lower Extremity Assessment Lower Extremity  Assessment: Generalized weakness    Cervical / Trunk Assessment Cervical / Trunk Assessment: Normal  Communication   Communication: No difficulties  Cognition Arousal/Alertness: Awake/alert Behavior During Therapy: WFL for tasks assessed/performed Overall Cognitive Status: History of cognitive impairments - at baseline Area of Impairment: Memory;Problem solving;Safety/judgement;Attention                 Orientation Level: Time Current Attention Level: Selective    Following Commands: Follows one step commands consistently Safety/Judgement: Decreased awareness of safety;Decreased awareness of deficits   Problem Solving: Slow processing        General Comments      Exercises General Exercises - Lower Extremity Long Arc Quad: AROM;Both;Seated;10 reps Hip Flexion/Marching: AROM;Seated;Both;10 reps   Assessment/Plan    PT Assessment Patient needs continued PT services  PT Problem List Decreased strength;Decreased range of motion;Decreased coordination;Decreased cognition;Decreased balance;Decreased mobility;Decreased activity tolerance;Decreased knowledge of use of DME;Decreased safety awareness;Cardiopulmonary status limiting activity;Obesity       PT Treatment Interventions DME instruction;Gait training;Stair training;Functional mobility training;Balance training;Patient/family education;Therapeutic exercise;Therapeutic activities    PT Goals (Current goals can be found in the Care Plan section)  Acute Rehab PT Goals Patient Stated Goal: feel better PT Goal Formulation: With patient Time For Goal Achievement: 01/12/19 Potential to Achieve Goals: Fair    Frequency Min 3X/week   Barriers to discharge Decreased caregiver support homeless    Co-evaluation               AM-PAC PT "6 Clicks" Mobility  Outcome Measure Help needed turning from your back to your side while in a flat bed without using bedrails?: A Little Help needed moving from lying on your back to sitting on the side of a flat bed without using bedrails?: A Little Help needed moving to and from a bed to a chair (including a wheelchair)?: A Little Help needed standing up from a chair using your arms (e.g., wheelchair or bedside chair)?: A Little Help needed to walk in hospital room?: A Little Help needed climbing 3-5 steps with a railing? : A Little 6 Click Score: 18    End of Session Equipment Utilized During Treatment: Gait belt;Oxygen Activity Tolerance:  Patient tolerated treatment well Patient left: in chair;with call bell/phone within reach;with chair alarm set Nurse Communication: Mobility status PT Visit Diagnosis: Unsteadiness on feet (R26.81);Other abnormalities of gait and mobility (R26.89);Difficulty in walking, not elsewhere classified (R26.2);Pain    Time: [id number] PT Time Calculation (min) (ACUTE ONLY): 17 min   Charges:   PT Evaluation $PT Eval Moderate Complexity: 1 Mod          Andreah Goheen Abner Greenspanabor Chandlar Guice, PT Acute Rehabilitation Services Pager: [phone] Office: [phone]   Jammy Plotkin B Melyssa Signor 12/29/2018, 11:52 AM

## 2018-12-29 NOTE — Progress Notes (Signed)
PROGRESS NOTE    [name]  VHQ:[id number] DOB: 1960-10-25 DOA: 12/25/2018 PCP: Patient, No Pcp Per  Brief Narrative: 58 year old male with chronic combined systolic and diastolic CHF with EF of 41%, chronic respiratory failure on 2 L home O2, polysubstance abuse hypertension, type 2 diabetes mellitus, schizophrenia, HCV, OSA, morbidly obese and homeless with numerous ER visits for shortness of breath and hospitalization for CHF, has left AGAINST MEDICAL ADVICE on several occasions. -He just left AMA from [facility] 6/25, he was admitted with CHF exacerbation, he has also been noted to have intermittent high-grade AV block from which he is asymptomatic however with his noncompliance homelessness and narrow QRS complex not felt to be a good candidate for BiV ICD -back with volume overload, started on diuretics again slow improvement  Assessment & Plan:   Acute on chronic systolic (congestive) heart failure ([place]) -Echo with EF of 25%, issues with compliance, substance abuse and homelessness which limits appropriate medical care -Clinically improving with diuresis, he is 3 L negative however continues to have significant oral liquid intake , ordered 1.2 L fluid restriction -Increase Lasix to 60 mg 3 times daily, continue KCl, -Monitor B met daily, I/O, daily weights -Continue lisinopril, no beta-blocker due to recurrent high-grade AV block -continue IV lasix, increase dose to 40 mg 3 times daily  Mobitz type II AV block -Asymptomatic recurrent high-grade AV block noted on telemetry through prior hospitalizations including multiple times last week, was followed by cardiology and seen by EP for this, due to complicated situation given noncompliance, homelessness, and narrow QRS complex not felt to be a good candidate for BiV ICD, based on EP discussion last week -Recommended to avoid AV nodal blocking agents, avoid cocaine and emphasized compliance with medications diuretics  Type 2 diabetes  mellitus -Last hemoglobin A1c was 9.7, -Continue Lantus and sliding scale insulin -Stable  Polysubstance abuse/cocaine abuse -Counseled, agrees to quit understands that it impedes his ability to receive optimal treatment and placement  Schizophrenia -Continue Abilify  Homelessness/noncompliance -Numerous ED visits and hospitalizations this month, this is the seventh hospitalization in June 6, including 9 ED visits -Social work, case management consulted  DVT prophylaxis: Lovenox  code Status: Full code Family Communication: No family at bedside Disposition Plan: disposition unknown pending adequate diuresis  Consultants:      Procedures:   Antimicrobials:    Subjective: -Feels tired and sleepy but his Ambien late last night, swelling improving very slowly according to the patient  Objective: Vitals:   12/29/18 0453 12/29/18 0855 12/29/18 1125 12/29/18 1400  BP: (!) 167/97  (!) 162/96   Pulse: 100 96 97   Resp: '20 20 20   ' Temp: 98 F (36.7 C)  98.3 F (36.8 C)   TempSrc: Oral  Oral   SpO2: 97% 95% 96% 97%  Weight:      Height:        Intake/Output Summary (Last 24 hours) at 12/29/2018 1405 Last data filed at 12/29/2018 1300 Gross per 24 hour  Intake 2317 ml  Output 3079 ml  Net -762 ml   Filed Weights   12/27/18 0256 12/28/18 0649 12/29/18 0450  Weight: 107.7 kg 107.4 kg 108.4 kg    Examination:  Gen: Chronically ill male, appears much older than stated age, AAO x2 HEENT: PERRLA, Neck supple, positive JVD Lungs: Fine basilar crackles decreased breath sounds at both bases CVS: RRR,No Gallops,Rubs or new Murmurs Abd: Soft obese distended umbilical hernia lateral abdominal wall edema Extremities: 2-3+ edema extending to  upper thigh, scrotal edema Skin: Hyperpigmentation of both lower extremities Psychiatry:  Mood & affect appropriate.     Data Reviewed:   CBC: Recent Labs  Lab 12/22/18 1655 12/23/18 0223 12/25/18 1612 12/27/18 0259  WBC  --   6.6 8.4 5.8  NEUTROABS  --  4.6 6.7  --   HGB 10.5* 8.4* 8.3* 8.5*  HCT 31.0* 29.2* 28.6* 29.3*  MCV  --  80.0 79.0* 78.3*  PLT  --  251 296 254   Basic Metabolic Panel: Recent Labs  Lab 12/25/18 1612 12/26/18 0314 12/27/18 0259 12/28/18 0524 12/29/18 0808  NA 138 139 137 137 136  K 3.8 3.5 3.8 3.9 3.9  CL 99 100 100 99 98  CO2 '29 31 28 29 31  ' GLUCOSE 231* 159* 170* 209* 200*  BUN '16 16 17 17 14  ' CREATININE 1.05 0.87 0.88 0.87 0.85  CALCIUM 8.6* 8.6* 8.7* 8.7* 8.7*   GFR: Estimated Creatinine Clearance: 116.4 mL/min (by C-G formula based on SCr of 0.85 mg/dL). Liver Function Tests: Recent Labs  Lab 12/25/18 1612  AST 22  ALT 15  ALKPHOS 181*  BILITOT 0.6  PROT 6.8  ALBUMIN 2.8*   Recent Labs  Lab 12/25/18 1612  LIPASE 25   No results for input(s): AMMONIA in the last 168 hours. Coagulation Profile: No results for input(s): INR, PROTIME in the last 168 hours. Cardiac Enzymes: No results for input(s): CKTOTAL, CKMB, CKMBINDEX, TROPONINI in the last 168 hours. BNP (last 3 results) No results for input(s): PROBNP in the last 8760 hours. HbA1C: No results for input(s): HGBA1C in the last 72 hours. CBG: Recent Labs  Lab 12/28/18 0607 12/28/18 1734 12/28/18 2128 12/29/18 0619 12/29/18 1124  GLUCAP 206* 400* 206* 225* 173*   Lipid Profile: No results for input(s): CHOL, HDL, LDLCALC, TRIG, CHOLHDL, LDLDIRECT in the last 72 hours. Thyroid Function Tests: No results for input(s): TSH, T4TOTAL, FREET4, T3FREE, THYROIDAB in the last 72 hours. Anemia Panel: No results for input(s): VITAMINB12, FOLATE, FERRITIN, TIBC, IRON, RETICCTPCT in the last 72 hours. Urine analysis:    Component Value Date/Time   COLORURINE YELLOW 12/18/2018 2353   APPEARANCEUR CLEAR 12/18/2018 2353   LABSPEC 1.016 12/18/2018 2353   PHURINE 7.0 12/18/2018 2353   GLUCOSEU NEGATIVE 12/18/2018 2353   HGBUR NEGATIVE 12/18/2018 2353   BILIRUBINUR NEGATIVE 12/18/2018 2353   KETONESUR  NEGATIVE 12/18/2018 2353   PROTEINUR 100 (A) 12/18/2018 2353   UROBILINOGEN 1.0 03/14/2015 0610   NITRITE NEGATIVE 12/18/2018 2353   LEUKOCYTESUR NEGATIVE 12/18/2018 2353   Sepsis Labs: '@LABRCNTIP' (procalcitonin:4,lacticidven:4)  ) Recent Results (from the past 240 hour(s))  SARS Coronavirus 2 (CEPHEID - Performed in [facility] hospital lab), Hosp Order     Status: None   Collection Time: 12/22/18  8:32 AM   Specimen: Nasopharyngeal Swab  Result Value Ref Range Status   SARS Coronavirus 2 NEGATIVE NEGATIVE Final    Comment: (NOTE) If result is NEGATIVE SARS-CoV-2 target nucleic acids are NOT DETECTED. The SARS-CoV-2 RNA is generally detectable in upper and lower  respiratory specimens during the acute phase of infection. The lowest  concentration of SARS-CoV-2 viral copies this assay can detect is 250  copies / mL. A negative result does not preclude SARS-CoV-2 infection  and should not be used as the sole basis for treatment or other  patient management decisions.  A negative result may occur with  improper specimen collection / handling, submission of specimen other  than nasopharyngeal swab, presence of viral  mutation(s) within the  areas targeted by this assay, and inadequate number of viral copies  (<250 copies / mL). A negative result must be combined with clinical  observations, patient history, and epidemiological information. If result is POSITIVE SARS-CoV-2 target nucleic acids are DETECTED. The SARS-CoV-2 RNA is generally detectable in upper and lower  respiratory specimens dur ing the acute phase of infection.  Positive  results are indicative of active infection with SARS-CoV-2.  Clinical  correlation with patient history and other diagnostic information is  necessary to determine patient infection status.  Positive results do  not rule out bacterial infection or co-infection with other viruses. If result is PRESUMPTIVE POSTIVE SARS-CoV-2 nucleic acids MAY BE  PRESENT.   A presumptive positive result was obtained on the submitted specimen  and confirmed on repeat testing.  While 2019 novel coronavirus  (SARS-CoV-2) nucleic acids may be present in the submitted sample  additional confirmatory testing may be necessary for epidemiological  and / or clinical management purposes  to differentiate between  SARS-CoV-2 and other Sarbecovirus currently known to infect humans.  If clinically indicated additional testing with an alternate test  methodology [phone]) is advised. The SARS-CoV-2 RNA is generally  detectable in upper and lower respiratory sp ecimens during the acute  phase of infection. The expected result is Negative. Fact Sheet for Patients:  StrictlyIdeas.no Fact Sheet for Healthcare Providers: BankingDealers.co.za This test is not yet approved or cleared by the [place] FDA and has been authorized for detection and/or diagnosis of SARS-CoV-2 by FDA under an Emergency Use Authorization (EUA).  This EUA will remain in effect (meaning this test can be used) for the duration of the COVID-19 declaration under Section 564(b)(1) of the Act, 21 U.S.C. section 360bbb-3(b)(1), unless the authorization is terminated or revoked sooner. Performed at [facility], [place] [address] [place], [place] [address]   Surgical PCR screen     Status: None   Collection Time: 12/22/18  6:57 PM   Specimen: Nasal Mucosa; Nasal Swab  Result Value Ref Range Status   MRSA, PCR NEGATIVE NEGATIVE Final   Staphylococcus aureus NEGATIVE NEGATIVE Final    Comment: (NOTE) The Xpert SA Assay (FDA approved for NASAL specimens in patients 100 years of age and older), is one component of a comprehensive surveillance program. It is not intended to diagnose infection nor to guide or monitor treatment. Performed at [facility], [place] [address], [place], [place] [address]   SARS Coronavirus 2 (CEPHEID -  Performed in [place] hospital lab), Hosp Order     Status: None   Collection Time: 12/25/18  8:17 PM   Specimen: Nasopharyngeal Swab  Result Value Ref Range Status   SARS Coronavirus 2 NEGATIVE NEGATIVE Final    Comment: (NOTE) If result is NEGATIVE SARS-CoV-2 target nucleic acids are NOT DETECTED. The SARS-CoV-2 RNA is generally detectable in upper and lower  respiratory specimens during the acute phase of infection. The lowest  concentration of SARS-CoV-2 viral copies this assay can detect is 250  copies / mL. A negative result does not preclude SARS-CoV-2 infection  and should not be used as the sole basis for treatment or other  patient management decisions.  A negative result may occur with  improper specimen collection / handling, submission of specimen other  than nasopharyngeal swab, presence of viral mutation(s) within the  areas targeted by this assay, and inadequate number of viral copies  (<250 copies / mL). A negative result must be  combined with clinical  observations, patient history, and epidemiological information. If result is POSITIVE SARS-CoV-2 target nucleic acids are DETECTED. The SARS-CoV-2 RNA is generally detectable in upper and lower  respiratory specimens dur ing the acute phase of infection.  Positive  results are indicative of active infection with SARS-CoV-2.  Clinical  correlation with patient history and other diagnostic information is  necessary to determine patient infection status.  Positive results do  not rule out bacterial infection or co-infection with other viruses. If result is PRESUMPTIVE POSTIVE SARS-CoV-2 nucleic acids MAY BE PRESENT.   A presumptive positive result was obtained on the submitted specimen  and confirmed on repeat testing.  While 2019 novel coronavirus  (SARS-CoV-2) nucleic acids may be present in the submitted sample  additional confirmatory testing may be necessary for epidemiological  and / or clinical management  purposes  to differentiate between  SARS-CoV-2 and other Sarbecovirus currently known to infect humans.  If clinically indicated additional testing with an alternate test  methodology [phone]) is advised. The SARS-CoV-2 RNA is generally  detectable in upper and lower respiratory sp ecimens during the acute  phase of infection. The expected result is Negative. Fact Sheet for Patients:  StrictlyIdeas.no Fact Sheet for Healthcare Providers: BankingDealers.co.za This test is not yet approved or cleared by the [place] FDA and has been authorized for detection and/or diagnosis of SARS-CoV-2 by FDA under an Emergency Use Authorization (EUA).  This EUA will remain in effect (meaning this test can be used) for the duration of the COVID-19 declaration under Section 564(b)(1) of the Act, 21 U.S.C. section 360bbb-3(b)(1), unless the authorization is terminated or revoked sooner. Performed at [facility], [place] [address], [place], [place] [address]          Radiology Studies: No results found.      Scheduled Meds:  ARIPiprazole  10 mg Oral Daily   aspirin EC  81 mg Oral Daily   carbamazepine  100 mg Oral BID   enoxaparin (LOVENOX) injection  40 mg Subcutaneous Q24H   furosemide  40 mg Intravenous Q8H   insulin aspart  0-5 Units Subcutaneous QHS   insulin aspart  0-9 Units Subcutaneous TID WC   ipratropium-albuterol  3 mL Inhalation TID   lisinopril  10 mg Oral Daily   mouth rinse  15 mL Mouth Rinse BID   nicotine  21 mg Transdermal Daily   pantoprazole  40 mg Oral Q1200   potassium chloride  20 mEq Oral TID   sodium chloride flush  3 mL Intravenous Q12H   Continuous Infusions:  sodium chloride       LOS: 3 days    Time spent: 37mn    PDomenic Polite MD [facility]  12/29/2018, 2:05 PM

## 2018-12-29 NOTE — Care Management Important Message (Signed)
Important Message  Patient Details  Name: [name] MRN: [id number] Date of Birth: Nov 05, 1960   Medicare Important Message Given:  Yes     Shelda Altes 12/29/2018, 12:29 PM

## 2018-12-29 NOTE — Progress Notes (Signed)
OT Cancellation Note  Patient Details Name: [name] MRN: [id number] DOB: [birth year]   Cancelled Treatment:    Reason Eval/Treat Not Completed: Patient declined, no reason specified; pt declined, reports "I've already walked this morning. I'll work with you next time".   Delight Stare, OT Acute Rehabilitation Services Pager [phone] Office [phone]   Delight Stare 12/29/2018, 11:17 AM

## 2018-12-30 LAB — BASIC METABOLIC PANEL
Anion gap: 9 (ref 5–15)
BUN: 16 mg/dL (ref 6–20)
CO2: 31 mmol/L (ref 22–32)
Calcium: 9 mg/dL (ref 8.9–10.3)
Chloride: 99 mmol/L (ref 98–111)
Creatinine, Ser: 0.9 mg/dL (ref 0.61–1.24)
GFR calc Af Amer: >60 mL/min (ref >60)
GFR calc non Af Amer: >60 mL/min (ref >60)
Glucose, Bld: 256 mg/dL — ABNORMAL HIGH (ref 70–99)
Potassium: 4.1 mmol/L (ref 3.5–5.1)
Sodium: 139 mmol/L (ref 135–145)

## 2018-12-30 LAB — GLUCOSE, CAPILLARY
Glucose-Capillary: 212 mg/dL — ABNORMAL HIGH (ref 70–99)
Glucose-Capillary: 222 mg/dL — ABNORMAL HIGH (ref 70–99)

## 2018-12-30 NOTE — Progress Notes (Addendum)
Inpatient Diabetes Program Recommendations  AACE/ADA: New Consensus Statement on Inpatient Glycemic Control (2015)  Target Ranges:  Prepandial:   less than 140 mg/dL      Peak postprandial:   less than 180 mg/dL (1-2 hours)      Critically ill patients:  140 - 180 mg/dL     Review of Glycemic Control  Diabetes history: DM 2 Outpatient Diabetes medications: Glipizide 2.5 mg bis (reported not taking) Current orders for Inpatient glycemic control: Novolog 0-9 units tid, 0-5 units qhs  A1c 9.7% , uncontrolled, not taking meds outpatient.  Inpatient Diabetes Program Recommendations:    Glucose trends elevated. Consider increasing Novolog to 0-15 units tid.  Addendum 1400 pm: Spoke with patient at bedside. Patient reports he was taking Metformin prior to d/c. Patient interrupted me and said all he needed was his metformin and if he can be discharged today. Patient says he gets paid tomorrow. Patient said he didn't have a home and as soon as he will be discharged he will be going back to the streets and use crack cocaine.  Patient reported needing metformin and meter at d/c. Will leave this to MD discretion.  Thanks,  Tama Headings RN, MSN, BC-ADM Inpatient Diabetes Coordinator Team Pager [phone] (8a-5p)

## 2018-12-30 NOTE — Plan of Care (Signed)
  Problem: Activity: Goal: Risk for activity intolerance will decrease Outcome: Progressing   Problem: Safety: Goal: Ability to remain free from injury will improve Outcome: Progressing   

## 2018-12-30 NOTE — TOC Progression Note (Signed)
Transition of Care [facility]) - Progression Note    Patient Details  Name: [name] MRN: [id number] Date of Birth: Dec 06, 1960  Transition [facility] CM/SW [place], LCSW Phone Number: 12/30/2018, 2:19 PM  Clinical Narrative: Patient still has no bed offers. He is aware and stated he plans on going to an [facility] on [address] when he can get his $100 from his cousin. He tried calling her on CSW's phone but it went straight to voicemail and voicemail is full. His check comes in on Friday and he said she will give him $400-$500. Patient stated he is leaving AMA today. There is already a bus pass in the room.    Expected Discharge Plan: Group Home Barriers to Discharge: Other (comment)(Securing a group home)  Expected Discharge Plan and Services Expected Discharge Plan: [place] arrangements for the past 2 months: Homeless                                       Social Determinants of Health (SDOH) Interventions    Readmission Risk Interventions Readmission Risk Prevention Plan 12/28/2018 12/18/2018 10/05/2018  Transportation Screening Complete Complete Complete  Medication Review Press photographer) - Complete Complete  PCP or Specialist appointment within 3-5 days of discharge - Complete Complete  HRI or [place] - Complete Patient refused  SW Recovery Care/Counseling Consult Complete Complete Complete  Palliative Care Screening Not Applicable Not Applicable Not [place] - Not Applicable Not Applicable  Some recent data might be hidden
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
## 2018-12-31 ENCOUNTER — Other Ambulatory Visit: Payer: Self-pay

## 2018-12-31 ENCOUNTER — Emergency Department (HOSPITAL_COMMUNITY): Payer: Medicare Other

## 2018-12-31 ENCOUNTER — Inpatient Hospital Stay (HOSPITAL_COMMUNITY)
Admission: EM | Admit: 2018-12-31 | Discharge: 2019-01-01 | DRG: 292 | Discharge status: Against Medical Advice | Payer: Medicare Other | Attending: Internal Medicine | Admitting: Internal Medicine

## 2018-12-31 ENCOUNTER — Encounter (HOSPITAL_COMMUNITY): Payer: Self-pay | Admitting: Emergency Medicine

## 2018-12-31 DIAGNOSIS — Z1159 Encounter for screening for other viral diseases: Secondary | ICD-10-CM | POA: Diagnosis not present

## 2018-12-31 DIAGNOSIS — Z7984 Long term (current) use of oral hypoglycemic drugs: Secondary | ICD-10-CM

## 2018-12-31 DIAGNOSIS — Z888 Allergy status to other drugs, medicaments and biological substances status: Secondary | ICD-10-CM

## 2018-12-31 DIAGNOSIS — D509 Iron deficiency anemia, unspecified: Secondary | ICD-10-CM | POA: Diagnosis present

## 2018-12-31 DIAGNOSIS — E119 Type 2 diabetes mellitus without complications: Secondary | ICD-10-CM

## 2018-12-31 DIAGNOSIS — D638 Anemia in other chronic diseases classified elsewhere: Secondary | ICD-10-CM | POA: Diagnosis present

## 2018-12-31 DIAGNOSIS — I714 Abdominal aortic aneurysm, without rupture, unspecified: Secondary | ICD-10-CM | POA: Diagnosis present

## 2018-12-31 DIAGNOSIS — F17210 Nicotine dependence, cigarettes, uncomplicated: Secondary | ICD-10-CM | POA: Diagnosis present

## 2018-12-31 DIAGNOSIS — Z5900 Homelessness unspecified: Secondary | ICD-10-CM

## 2018-12-31 DIAGNOSIS — F1420 Cocaine dependence, uncomplicated: Secondary | ICD-10-CM | POA: Diagnosis present

## 2018-12-31 DIAGNOSIS — I110 Hypertensive heart disease with heart failure: Secondary | ICD-10-CM | POA: Diagnosis not present

## 2018-12-31 DIAGNOSIS — M79606 Pain in leg, unspecified: Secondary | ICD-10-CM | POA: Diagnosis not present

## 2018-12-31 DIAGNOSIS — Z79899 Other long term (current) drug therapy: Secondary | ICD-10-CM

## 2018-12-31 DIAGNOSIS — N492 Inflammatory disorders of scrotum: Secondary | ICD-10-CM | POA: Diagnosis present

## 2018-12-31 DIAGNOSIS — N485 Ulcer of penis: Secondary | ICD-10-CM | POA: Diagnosis present

## 2018-12-31 DIAGNOSIS — F200 Paranoid schizophrenia: Secondary | ICD-10-CM | POA: Diagnosis present

## 2018-12-31 DIAGNOSIS — Z9119 Patient's noncompliance with other medical treatment and regimen: Secondary | ICD-10-CM

## 2018-12-31 DIAGNOSIS — Z8249 Family history of ischemic heart disease and other diseases of the circulatory system: Secondary | ICD-10-CM

## 2018-12-31 DIAGNOSIS — I441 Atrioventricular block, second degree: Secondary | ICD-10-CM | POA: Diagnosis present

## 2018-12-31 DIAGNOSIS — I5043 Acute on chronic combined systolic (congestive) and diastolic (congestive) heart failure: Secondary | ICD-10-CM | POA: Diagnosis present

## 2018-12-31 DIAGNOSIS — D649 Anemia, unspecified: Secondary | ICD-10-CM | POA: Diagnosis present

## 2018-12-31 DIAGNOSIS — E1165 Type 2 diabetes mellitus with hyperglycemia: Secondary | ICD-10-CM | POA: Diagnosis present

## 2018-12-31 DIAGNOSIS — Z833 Family history of diabetes mellitus: Secondary | ICD-10-CM | POA: Diagnosis not present

## 2018-12-31 DIAGNOSIS — Z5329 Procedure and treatment not carried out because of patient's decision for other reasons: Secondary | ICD-10-CM | POA: Diagnosis present

## 2018-12-31 DIAGNOSIS — R601 Generalized edema: Secondary | ICD-10-CM

## 2018-12-31 LAB — COMPREHENSIVE METABOLIC PANEL
ALT: 14 U/L (ref 0–44)
AST: 25 U/L (ref 15–41)
Albumin: 3.3 g/dL — ABNORMAL LOW (ref 3.5–5.0)
Alkaline Phosphatase: 197 U/L — ABNORMAL HIGH (ref 38–126)
Anion gap: 16 — ABNORMAL HIGH (ref 5–15)
BUN: 21 mg/dL — ABNORMAL HIGH (ref 6–20)
CO2: 24 mmol/L (ref 22–32)
Calcium: 9.1 mg/dL (ref 8.9–10.3)
Chloride: 100 mmol/L (ref 98–111)
Creatinine, Ser: 0.99 mg/dL (ref 0.61–1.24)
GFR calc Af Amer: >60 mL/min (ref >60)
GFR calc non Af Amer: >60 mL/min (ref >60)
Glucose, Bld: 212 mg/dL — ABNORMAL HIGH (ref 70–99)
Potassium: 3.6 mmol/L (ref 3.5–5.1)
Sodium: 140 mmol/L (ref 135–145)
Total Bilirubin: 0.5 mg/dL (ref 0.3–1.2)
Total Protein: 7.8 g/dL (ref 6.5–8.1)

## 2018-12-31 LAB — SARS CORONAVIRUS 2 BY RT PCR (HOSPITAL ORDER, PERFORMED IN ~~LOC~~ HOSPITAL LAB): SARS Coronavirus 2: NEGATIVE

## 2018-12-31 LAB — TROPONIN I (HIGH SENSITIVITY): Troponin I (High Sensitivity): 21 ng/L — ABNORMAL HIGH (ref <18)

## 2018-12-31 LAB — RAPID URINE DRUG SCREEN, HOSP PERFORMED
Amphetamines: NOT DETECTED
Barbiturates: NOT DETECTED
Benzodiazepines: NOT DETECTED
Cocaine: POSITIVE — AB
Opiates: NOT DETECTED
Tetrahydrocannabinol: NOT DETECTED

## 2018-12-31 LAB — CBC WITH DIFFERENTIAL/PLATELET
Abs Immature Granulocytes: 0.03 10*3/uL (ref 0.00–0.07)
Basophils Absolute: 0.1 10*3/uL (ref 0.0–0.1)
Basophils Relative: 1 %
Eosinophils Absolute: 0 10*3/uL (ref 0.0–0.5)
Eosinophils Relative: 0 %
HCT: 31.1 % — ABNORMAL LOW (ref 39.0–52.0)
Hemoglobin: 8.8 g/dL — ABNORMAL LOW (ref 13.0–17.0)
Immature Granulocytes: 0 %
Lymphocytes Relative: 10 %
Lymphs Abs: 0.8 10*3/uL (ref 0.7–4.0)
MCH: 22.3 pg — ABNORMAL LOW (ref 26.0–34.0)
MCHC: 28.3 g/dL — ABNORMAL LOW (ref 30.0–36.0)
MCV: 78.9 fL — ABNORMAL LOW (ref 80.0–100.0)
Monocytes Absolute: 0.9 10*3/uL (ref 0.1–1.0)
Monocytes Relative: 12 %
Neutro Abs: 5.8 10*3/uL (ref 1.7–7.7)
Neutrophils Relative %: 77 %
Platelets: 322 10*3/uL (ref 150–400)
RBC: 3.94 MIL/uL — ABNORMAL LOW (ref 4.22–5.81)
RDW: 20.6 % — ABNORMAL HIGH (ref 11.5–15.5)
WBC: 7.6 10*3/uL (ref 4.0–10.5)
nRBC: 0.3 % — ABNORMAL HIGH (ref 0.0–0.2)

## 2018-12-31 LAB — GLUCOSE, CAPILLARY: Glucose-Capillary: 254 mg/dL — ABNORMAL HIGH (ref 70–99)

## 2018-12-31 LAB — BRAIN NATRIURETIC PEPTIDE: B Natriuretic Peptide: 891.8 pg/mL — ABNORMAL HIGH (ref 0.0–100.0)

## 2018-12-31 MED ORDER — ACETAMINOPHEN 325 MG PO TABS
650.0000 mg | ORAL_TABLET | Freq: Four times a day (QID) | ORAL | Status: DC | PRN
  Administered 2019-01-01 (×2): 650 mg via ORAL
  Filled 2018-12-31 (×2): qty 2

## 2018-12-31 MED ORDER — POTASSIUM CHLORIDE CRYS ER 20 MEQ PO TBCR: 20.0000 meq | EXTENDED_RELEASE_TABLET | Freq: Two times a day (BID) | ORAL | Status: DC

## 2018-12-31 MED ORDER — PIPERACILLIN-TAZOBACTAM 3.375 G IVPB
3.3750 g | Freq: Three times a day (TID) | INTRAVENOUS | Status: DC
  Administered 2019-01-01 (×2): 3.375 g via INTRAVENOUS
  Filled 2018-12-31 (×2): qty 50

## 2018-12-31 MED ORDER — ONDANSETRON HCL 4 MG PO TABS: 4.0000 mg | ORAL_TABLET | Freq: Four times a day (QID) | ORAL | Status: DC | PRN

## 2018-12-31 MED ORDER — IPRATROPIUM-ALBUTEROL 20-100 MCG/ACT IN AERS: 1.0000 | INHALATION_SPRAY | Freq: Four times a day (QID) | RESPIRATORY_TRACT | Status: DC | PRN

## 2018-12-31 MED ORDER — FUROSEMIDE 10 MG/ML IJ SOLN
80.0000 mg | Freq: Once | INTRAMUSCULAR | Status: AC
  Administered 2018-12-31: 80 mg via INTRAVENOUS
  Filled 2018-12-31: qty 8

## 2018-12-31 MED ORDER — ACETAMINOPHEN 650 MG RE SUPP: 650.0000 mg | Freq: Four times a day (QID) | RECTAL | Status: DC | PRN

## 2018-12-31 MED ORDER — ACETAMINOPHEN 325 MG PO TABS
650.0000 mg | ORAL_TABLET | Freq: Once | ORAL | Status: AC
  Administered 2018-12-31: 650 mg via ORAL
  Filled 2018-12-31: qty 2

## 2018-12-31 MED ORDER — ONDANSETRON HCL 4 MG/2ML IJ SOLN: 4.0000 mg | Freq: Four times a day (QID) | INTRAMUSCULAR | Status: DC | PRN

## 2018-12-31 MED ORDER — CARBAMAZEPINE 200 MG PO TABS
100.0000 mg | ORAL_TABLET | Freq: Two times a day (BID) | ORAL | Status: DC
  Administered 2019-01-01: 100 mg via ORAL
  Filled 2018-12-31 (×2): qty 0.5

## 2018-12-31 MED ORDER — ENOXAPARIN SODIUM 40 MG/0.4ML ~~LOC~~ SOLN
40.0000 mg | Freq: Every day | SUBCUTANEOUS | Status: DC
  Administered 2019-01-01: 40 mg via SUBCUTANEOUS
  Filled 2018-12-31: qty 0.4

## 2018-12-31 MED ORDER — LISINOPRIL 10 MG PO TABS: 10.0000 mg | ORAL_TABLET | Freq: Every day | ORAL | Status: DC

## 2018-12-31 MED ORDER — ARIPIPRAZOLE 10 MG PO TABS
10.0000 mg | ORAL_TABLET | Freq: Every day | ORAL | Status: DC
  Filled 2018-12-31: qty 1

## 2018-12-31 MED ORDER — FUROSEMIDE 10 MG/ML IJ SOLN
80.0000 mg | Freq: Two times a day (BID) | INTRAMUSCULAR | Status: DC
  Administered 2019-01-01: 80 mg via INTRAVENOUS
  Filled 2018-12-31: qty 8

## 2018-12-31 MED ORDER — INSULIN ASPART 100 UNIT/ML ~~LOC~~ SOLN
0.0000 [IU] | Freq: Three times a day (TID) | SUBCUTANEOUS | Status: DC
  Administered 2019-01-01: 09:00:00 3 [IU] via SUBCUTANEOUS

## 2018-12-31 MED ORDER — IPRATROPIUM-ALBUTEROL 0.5-2.5 (3) MG/3ML IN SOLN: 3.0000 mL | Freq: Four times a day (QID) | RESPIRATORY_TRACT | Status: DC | PRN

## 2018-12-31 NOTE — ED Notes (Signed)
Report given to Bobby, RN.  

## 2018-12-31 NOTE — ED Provider Notes (Signed)
[facility]-EMERGENCY DEPT Provider Note   CSN: [id number] Arrival date & time: 12/31/18  1629     History   Chief Complaint Chief Complaint  Patient presents with  . Leg Pain  . genital swelling    HPI [name] is a 58 y.o. male.     Patient complains of swelling to his scrotum and his legs.  Patient has a history of congestive heart failure diabetes hypertension cocaine use  The history is provided by the patient. No language interpreter was used.  Leg Pain Location:  Leg Injury: no   Leg location:  L leg and R leg Pain details:    Quality:  Aching   Radiates to:  Does not radiate   Severity:  Moderate   Timing:  Constant   Progression:  Worsening Chronicity:  Recurrent Foreign body present:  No foreign bodies Tetanus status:  Unknown Prior injury to area:  No Relieved by:  Nothing Associated symptoms: no back pain and no fatigue     Past Medical History:  Diagnosis Date  . Chronic foot pain   . Cocaine abuse (HCC)   . Diabetes mellitus without complication (HCC)   . Hepatitis C    unsure   . Homelessness   . Hypertension   . Neuropathy   . Polysubstance abuse (HCC)   . Schizophrenia (HCC)   . Sleep apnea   . Systolic and diastolic CHF, chronic [facility](HCC)     Patient Active Problem List   Diagnosis Date Noted  . Acute on chronic systolic heart failure (HCC) 12/26/2018  . Mobitz type II atrioventricular block 12/25/2018  . Acute exacerbation of CHF (congestive heart failure) (HCC) 12/22/2018  . Acute on chronic respiratory failure with hypoxia (HCC) 12/22/2018  . CHF exacerbation (HCC) 12/19/2018  . Acute CHF (congestive heart failure) (HCC) 12/16/2018  . Diabetes mellitus without complication (HCC) 12/11/2018  . Acute on chronic systolic (congestive) heart failure (HCC) 12/11/2018  . Abdominal pain 12/11/2018  . Nausea vomiting and diarrhea 12/11/2018  . Anemia 12/11/2018  . Evaluation by psychiatric service required    . MDD (major depressive disorder), severe (HCC) 11/25/2018  . Pressure injury of skin 11/08/2018  . Elevated troponin 10/18/2018  . HLD (hyperlipidemia) 10/18/2018  . Anxiety 10/18/2018  . CHF (congestive heart failure), NYHA class II, acute on chronic, combined (HCC) 10/18/2018  . Hypertensive urgency 10/12/2018  . Mild renal insufficiency 10/12/2018  . Cellulitis 10/12/2018  . Penile cellulitis   . Adjustment disorder with mixed disturbance of emotions and conduct   . Sleep apnea 10/03/2018  . Hypokalemia 09/17/2018  . Scrotal swelling 09/17/2018  . Urinary hesitancy   . Constipation   . Acute on chronic combined systolic and diastolic CHF (congestive heart failure) (HCC) 08/25/2018  . Tobacco use 08/18/2018  . Homelessness 08/08/2018  . Smoker 08/08/2018  . Prostate enlargement 03/16/2018  . Aortic atherosclerosis (HCC) 03/16/2018  . 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 Chronic foot pain   . Schizoaffective disorder, bipolar type (HCC) 09/30/2016  . Substance induced mood disorder (HCC) 03/13/2015  . Schizophrenia, paranoid type (HCC) 01/17/2015  . Drug hallucinosis (HCC) 10/08/2014  . Chronic paranoid schizophrenia (HCC) 09/07/2014  . 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 Urinary retention   . Cocaine use disorder, severe, dependence (HCC)   . Essential hypertension, benign 03/28/2013  . Insulin-requiring or dependent type II diabetes mellitus (HCC) 03/15/2013    Past  Surgical History:  Procedure Laterality Date  . MULTIPLE TOOTH EXTRACTIONS          Home Medications    Prior to Admission medications   Medication Sig Start Date End Date Taking? Authorizing Provider  ARIPiprazole (ABILIFY) 10 MG tablet Take 1 tablet (10 mg total) by mouth daily. 12/10/18   Pokhrel, Corrie Mckusick, MD  carbamazepine (TEGRETOL) 200 MG tablet Take 0.5 tablets (100 mg total) by mouth 2 (two) times daily.  11/26/18   Daleen Bo, MD  furosemide (LASIX) 40 MG tablet Take 1 tablet (40 mg total) by mouth 2 (two) times daily for 30 days. 11/26/18 12/26/18  Daleen Bo, MD  glipiZIDE (GLUCOTROL) 5 MG tablet Take 0.5 tablets (2.5 mg total) by mouth 2 (two) times daily before a meal. Patient not taking: Reported on 12/19/2018 12/18/18   Georgette Shell, MD  Ipratropium-Albuterol (COMBIVENT) 20-100 MCG/ACT AERS respimat Inhale 1 puff into the lungs every 6 (six) hours. 11/30/18   Palumbo, April, MD  lisinopril (ZESTRIL) 10 MG tablet Take 1 tablet (10 mg total) by mouth daily. 11/26/18   Daleen Bo, MD  potassium chloride SA (K-DUR) 20 MEQ tablet Take 1 tablet (20 mEq total) by mouth 2 (two) times daily. 11/30/18   Palumbo, April, MD    Family History Family History  Problem Relation Age of Onset  . Hypertension Other   . Diabetes Other     Social History Social History   Tobacco Use  . Smoking status: Current Every Day Smoker    Packs/day: 1.00    Years: 20.00    Pack years: 20.00    Types: Cigarettes  . Smokeless tobacco: Current User  Substance Use Topics  . Alcohol use: Yes    Comment: Daily Drinker   . Drug use: Yes    Frequency: 7.0 times per week    Types: "Crack" cocaine, Cocaine, Marijuana     Allergies   Haldol [haloperidol]   Review of Systems Review of Systems  Constitutional: Negative for appetite change and fatigue.  HENT: Negative for congestion, ear discharge and sinus pressure.   Eyes: Negative for discharge.  Respiratory: Negative for cough.   Cardiovascular: Negative for chest pain.  Gastrointestinal: Negative for abdominal pain and diarrhea.  Genitourinary: Negative for frequency and hematuria.  Musculoskeletal: Negative for back pain.       Swelling to both legs  Skin: Negative for rash.  Neurological: Negative for seizures and headaches.  Psychiatric/Behavioral: Negative for hallucinations.     Physical Exam Updated Vital Signs BP (!) 166/106    Pulse (!) 101   Temp 99.1 F (37.3 C) (Oral)   Resp (!) 23   SpO2 96%   Physical Exam Vitals signs and nursing note reviewed.  Constitutional:      Appearance: He is well-developed.  HENT:     Head: Normocephalic.     Nose: Nose normal.  Eyes:     General: No scleral icterus.    Conjunctiva/sclera: Conjunctivae normal.  Neck:     Musculoskeletal: Neck supple.     Thyroid: No thyromegaly.  Cardiovascular:     Rate and Rhythm: Normal rate and regular rhythm.     Heart sounds: No murmur. No friction rub. No gallop.   Pulmonary:     Breath sounds: No stridor. No wheezing or rales.  Chest:     Chest wall: No tenderness.  Abdominal:     General: There is no distension.     Tenderness: There is no abdominal tenderness.  There is no rebound.  Musculoskeletal: Normal range of motion.     Comments: Patient has 3+ edema all the way up to his umbilicus along with swelling in the scrotum  Lymphadenopathy:     Cervical: No cervical adenopathy.  Skin:    Findings: No erythema or rash.  Neurological:     Mental Status: He is oriented to person, place, and time.     Motor: No abnormal muscle tone.     Coordination: Coordination normal.  Psychiatric:        Behavior: Behavior normal.      ED Treatments / Results  Labs (all labs ordered are listed, but only abnormal results are displayed) Labs Reviewed  CBC WITH DIFFERENTIAL/PLATELET - Abnormal; Notable for the following components:      Result Value   RBC 3.94 (*)    Hemoglobin 8.8 (*)    HCT 31.1 (*)    MCV 78.9 (*)    MCH 22.3 (*)    MCHC 28.3 (*)    RDW 20.6 (*)    nRBC 0.3 (*)    All other components within normal limits  COMPREHENSIVE METABOLIC PANEL - Abnormal; Notable for the following components:   Glucose, Bld 212 (*)    BUN 21 (*)    Albumin 3.3 (*)    Alkaline Phosphatase 197 (*)    Anion gap 16 (*)    All other components within normal limits  BRAIN NATRIURETIC PEPTIDE - Abnormal; Notable for the  following components:   B Natriuretic Peptide 891.8 (*)    All other components within normal limits  TROPONIN I (HIGH SENSITIVITY) - Abnormal; Notable for the following components:   Troponin I (High Sensitivity) 21 (*)    All other components within normal limits  SARS CORONAVIRUS 2 ([facility]PITAL ORDER[facility])    EKG EKG Interpretation  Date/Time:  Thursday December 31 2018 17:44:09 EDT Ventricular Rate:  107 PR Interval:    QRS Duration: 88 QT Interval:  329 QTC Calculation: 439 R Axis:   64 Text Interpretation:  Borderline low voltage, extremity leads Nonspecific T abnrm, anterolateral leads Confirmed by Bethann BerkshireZammit, Sahara Fujimoto [phone]([id number]) on 12/31/2018 6:59:19 PM   Radiology Dg Chest Port 1 View  Result Date: 12/31/2018 CLINICAL DATA:  Shortness of breath. EXAM: PORTABLE CHEST 1 VIEW COMPARISON:  Radiograph of December 25, 2018. FINDINGS: Stable cardiomegaly with central pulmonary vascular congestion is noted. No pneumothorax is noted. Mild bibasilar subsegmental atelectasis is noted with minimal pleural effusions. Bony thorax is unremarkable. IMPRESSION: Stable cardiomegaly with central pulmonary vascular congestion. Mild bibasilar subsegmental atelectasis is noted with minimal pleural effusions. Electronically Signed   By: Lupita RaiderJames  Green Jr M.D.   On: 12/31/2018 18:23    Procedures Procedures (including critical care time)  Medications Ordered in ED Medications  acetaminophen (TYLENOL) tablet 650 mg (650 mg Oral Given 12/31/18 1826)  furosemide (LASIX) injection 80 mg (80 mg Intravenous Given 12/31/18 1912)     Initial Impression / Assessment and Plan / ED Course  I have reviewed the triage vital signs and the nursing notes.  Pertinent labs & imaging results that were available during my care of the patient were reviewed by me and considered in my medical decision making (see chart for details).        Patient with anasarca.  He will be admitted for diuresis   Final Clinical Impressions(s) / ED Diagnoses   Final diagnoses:  Anasarca    ED Discharge Orders  None       Bethann BerkshireZammit, Akyra Bouchie, MD 12/31/18 1921

## 2018-12-31 NOTE — Progress Notes (Signed)
Chart reviewed for LOS; B Cameron Schwinn RN,MHA,BSN Advanced Care Supervisor [phone] 

## 2018-12-31 NOTE — ED Triage Notes (Signed)
Per [facility] pt comes [place] [employer] for genital swelling x 8 months and leg pains for standing since he was discharged from [facility] last night.

## 2018-12-31 NOTE — H&P (Signed)
History and Physical    Taylor MemosFrederick L Esterly ZOX:[id number] DOB: [birth year] DOA: 12/31/2018  PCP: Patient, No Pcp Per  Patient coming from: Patient is homeless.  Chief Complaint: Lower extremity edema shortness of breath.  HPI: [name] is a 58 y.o. male with history of chronic systolic diastolic heart failure last EF measured in May 2022 months ago was showing EF of 25 to 30% with severely reduced right ventricular systolic function with history of cocaine abuse diabetes mellitus schizophrenia chronic anemia who was signed out AGAINST MEDICAL ADVICE yesterday after being admitted for acute CHF and also during the stay was found to have intermittent AV blocks presents to the ER because of worsening lower extremity edema and genital swelling.  Patient states he has shortness of breath which is chronic.  Denies any chest pain productive cough fever or chills.  He states that his lower extremity edema has increased significantly and also has abdominal distention with no nausea vomiting or diarrhea.  Also noticed increasing discharge from the penile ulcer which CCAC sustained a month ago while using the urinal.  He is noting pus around also.  ED Course: In the ER on exam patient has significant edema of bilateral lower extremity extending up to the groin and has distended abdomen but nontender.  Elevated JVD.  Chest x-ray shows cardiomegaly with vascular congestion and bilateral pleural effusion.  EKG shows sinus tachycardia.  Labs show potassium of 3.6 creatinine 0.9 blood glucose 212 BNP 891 high-sensitivity troponin XX 1 hemoglobin 8.8 platelets 322.  Patient was given Lasix 80 mg IV admitted for acute CHF.  On my exam patient also has a small ulceration on the base of the the penis on the left side.  There is no active discharge on my exam and the base looks nonerythematous.  No signs of any crepitations or signs of Fournier's.  Review of Systems: As per HPI, rest all negative.   Past  Medical History:  Diagnosis Date   Chronic foot pain    Cocaine abuse (HCC)    Diabetes mellitus without complication (HCC)    Hepatitis C    unsure    Homelessness    Hypertension    Neuropathy    Polysubstance abuse (HCC)    Schizophrenia (HCC)    Sleep apnea    Systolic and diastolic CHF, chronic (HCC)     Past Surgical History:  Procedure Laterality Date   MULTIPLE TOOTH EXTRACTIONS       reports that he has been smoking cigarettes. He has a 20.00 pack-year smoking history. He uses smokeless tobacco. He reports current alcohol use. He reports current drug use. Frequency: 7.00 times per week. Drugs: "Crack" cocaine, Cocaine, and Marijuana.  Allergies  Allergen Reactions   Haldol [Haloperidol] Other (See Comments)    Muscle spasms, loss of voluntary movement. However, pt has taken Thorazine on multiple occasions with no adverse effects.     Family History  Problem Relation Age of Onset   Hypertension Other    Diabetes Other     Prior to Admission medications   Medication Sig Start Date End Date Taking? Authorizing Provider  ARIPiprazole (ABILIFY) 10 MG tablet Take 1 tablet (10 mg total) by mouth daily. 12/10/18   Pokhrel, Rebekah ChesterfieldLaxman, MD  carbamazepine (TEGRETOL) 200 MG tablet Take 0.5 tablets (100 mg total) by mouth 2 (two) times daily. 11/26/18   Mancel BaleWentz, Elliott, MD  furosemide (LASIX) 40 MG tablet Take 1 tablet (40 mg total) by mouth 2 (two) times  daily for 30 days. 11/26/18 12/26/18  Mancel Bale, MD  glipiZIDE (GLUCOTROL) 5 MG tablet Take 0.5 tablets (2.5 mg total) by mouth 2 (two) times daily before a meal. Patient not taking: Reported on 12/19/2018 12/18/18   Alwyn Ren, MD  Ipratropium-Albuterol (COMBIVENT) 20-100 MCG/ACT AERS respimat Inhale 1 puff into the lungs every 6 (six) hours. 11/30/18   Palumbo, April, MD  lisinopril (ZESTRIL) 10 MG tablet Take 1 tablet (10 mg total) by mouth daily. 11/26/18   Mancel Bale, MD  potassium chloride SA (K-DUR)  20 MEQ tablet Take 1 tablet (20 mEq total) by mouth 2 (two) times daily. 11/30/18   Palumbo, April, MD    Physical Exam: Constitutional: Moderately built and nourished. Vitals:   12/31/18 1636 12/31/18 1800 12/31/18 1900  BP: (!) 141/76 (!) 145/86 (!) 166/106  Pulse: (!) 106 99 (!) 101  Resp: 18 20 (!) 23  Temp: 99.1 F (37.3 C)    TempSrc: Oral    SpO2: 98% 98% 96%   Eyes: Anicteric no pallor. ENMT: No discharge from the ears eyes nose or mouth. Neck: JVD elevated no mass felt. Respiratory: No rhonchi or crepitations. Cardiovascular: S1-S2 heard. Abdomen: Distended nontender bowel sounds present. Musculoskeletal: Bilateral lower extremity edema extending up to the groin. Skin: Ulceration on the base of the penis. Neurologic: Alert awake oriented to time place and person.  Moves all extremities. Psychiatric: Appears normal per normal affect.   Labs on Admission: I have personally reviewed following labs and imaging studies  CBC: Recent Labs  Lab 12/25/18 1612 12/27/18 0259 12/31/18 1757  WBC 8.4 5.8 7.6  NEUTROABS 6.7  --  5.8  HGB 8.3* 8.5* 8.8*  HCT 28.6* 29.3* 31.1*  MCV 79.0* 78.3* 78.9*  PLT 296 312 322   Basic Metabolic Panel: Recent Labs  Lab 12/27/18 0259 12/28/18 0524 12/29/18 0808 12/30/18 0349 12/31/18 1757  NA 137 137 136 139 140  K 3.8 3.9 3.9 4.1 3.6  CL 100 99 98 99 100  CO2 GLUCOSE 170* 209* 200* 256* 212*  BUN 21*  CREATININE 0.88 0.87 0.85 0.90 0.99  CALCIUM 8.7* 8.7* 8.7* 9.0 9.1   GFR: Estimated Creatinine Clearance: 99.1 mL/min (by C-G formula based on SCr of 0.99 mg/dL). Liver Function Tests: Recent Labs  Lab 12/25/18 1612 12/31/18 1757  AST 22 25  ALT 15 14  ALKPHOS 181* 197*  BILITOT 0.6 0.5  PROT 6.8 7.8  ALBUMIN 2.8* 3.3*   Recent Labs  Lab 12/25/18 1612  LIPASE 25   No results for input(s): AMMONIA in the last 168 hours. Coagulation Profile: No results for input(s): INR, PROTIME in the  last 168 hours. Cardiac Enzymes: No results for input(s): CKTOTAL, CKMB, CKMBINDEX, TROPONINI in the last 168 hours. BNP (last 3 results) No results for input(s): PROBNP in the last 8760 hours. HbA1C: No results for input(s): HGBA1C in the last 72 hours. CBG: Recent Labs  Lab 12/29/18 1124 12/29/18 1634 12/29/18 2149 12/30/18 0530 12/30/18 1105  GLUCAP 173* 241* 181* 212* 222*   Lipid Profile: No results for input(s): CHOL, HDL, LDLCALC, TRIG, CHOLHDL, LDLDIRECT in the last 72 hours. Thyroid Function Tests: No results for input(s): TSH, T4TOTAL, FREET4, T3FREE, THYROIDAB in the last 72 hours. Anemia Panel: No results for input(s): VITAMINB12, FOLATE, FERRITIN, TIBC, IRON, RETICCTPCT in the last 72 hours. Urine analysis:    Component Value Date/Time   COLORURINE YELLOW 12/18/2018 2353   APPEARANCEUR  CLEAR 12/18/2018 2353   LABSPEC 1.016 12/18/2018 2353   PHURINE 7.0 12/18/2018 2353   GLUCOSEU NEGATIVE 12/18/2018 2353   HGBUR NEGATIVE 12/18/2018 2353   BILIRUBINUR NEGATIVE 12/18/2018 2353   KETONESUR NEGATIVE 12/18/2018 2353   PROTEINUR 100 (A) 12/18/2018 2353   UROBILINOGEN 1.0 03/14/2015 0610   NITRITE NEGATIVE 12/18/2018 2353   LEUKOCYTESUR NEGATIVE 12/18/2018 2353   Sepsis Labs: @LABRCNTIP (procalcitonin:4,lacticidven:4) ) Recent Results (from the past 240 hour(s))  SARS Coronavirus 2 (CEPHEID - Performed in [facility]), Hosp Order     Status: None   Collection Time: 12/22/18  8:32 AM   Specimen: Nasopharyngeal Swab  Result Value Ref Range Status   SARS Coronavirus 2 NEGATIVE NEGATIVE Final    Comment: (NOTE) If result is NEGATIVE SARS-CoV-2 target nucleic acids are NOT DETECTED. The SARS-CoV-2 RNA is generally detectable in upper and lower  respiratory specimens during the acute phase of infection. The lowest  concentration of SARS-CoV-2 viral copies this assay can detect is 250  copies / mL. A negative result does not preclude SARS-CoV-2  infection  and should not be used as the sole basis for treatment or other  patient management decisions.  A negative result may occur with  improper specimen collection / handling, submission of specimen other  than nasopharyngeal swab, presence of viral mutation(s) within the  areas targeted by this assay, and inadequate number of viral copies  (<250 copies / mL). A negative result must be combined with clinical  observations, patient history, and epidemiological information. If result is POSITIVE SARS-CoV-2 target nucleic acids are DETECTED. The SARS-CoV-2 RNA is generally detectable in upper and lower  respiratory specimens dur ing the acute phase of infection.  Positive  results are indicative of active infection with SARS-CoV-2.  Clinical  correlation with patient history and other diagnostic information is  necessary to determine patient infection status.  Positive results do  not rule out bacterial infection or co-infection with other viruses. If result is PRESUMPTIVE POSTIVE SARS-CoV-2 nucleic acids MAY BE PRESENT.   A presumptive positive result was obtained on the submitted specimen  and confirmed on repeat testing.  While 2019 novel coronavirus  (SARS-CoV-2) nucleic acids may be present in the submitted sample  additional confirmatory testing may be necessary for epidemiological  and / or clinical management purposes  to differentiate between  SARS-CoV-2 and other Sarbecovirus currently known to infect humans.  If clinically indicated additional testing with an alternate test  methodology [phone]([id number]) is advised. The SARS-CoV-2 RNA is generally  detectable in upper and lower respiratory sp ecimens during the acute  phase of infection. The expected result is Negative. Fact Sheet for Patients:  BoilerBrush.com.cyhttps://www.fda.gov/media/136312/download Fact Sheet for Healthcare Providers: https://pope.com/https://www.fda.gov/media/136313/download This test is not yet approved or cleared by the [place]  FDA and has been authorized for detection and/or diagnosis of SARS-CoV-2 by FDA under an Emergency Use Authorization (EUA).  This EUA will remain in effect (meaning this test can be used) for the duration of the COVID-19 declaration under Section 564(b)(1) of the Act, 21 U.S.C. section 360bbb-3(b)(1), unless the authorization is terminated or revoked sooner. Performed at [facility], [address] [place], [place] [address]   Surgical PCR screen     Status: None   Collection Time: 12/22/18  6:57 PM   Specimen: Nasal Mucosa; Nasal Swab  Result Value Ref Range Status   MRSA, PCR NEGATIVE NEGATIVE Final   Staphylococcus aureus NEGATIVE NEGATIVE Final    Comment: (NOTE)  The Xpert SA Assay (FDA approved for NASAL specimens in patients 81 years of age and older), is one component of a comprehensive surveillance program. It is not intended to diagnose infection nor to guide or monitor treatment. Performed at [facility], [place] [address] [place], [place] [address]   SARS Coronavirus 2 (CEPHEID - Performed in [place] hospital lab), Hosp Order     Status: None   Collection Time: 12/25/18  8:17 PM   Specimen: Nasopharyngeal Swab  Result Value Ref Range Status   SARS Coronavirus 2 NEGATIVE NEGATIVE Final    Comment: (NOTE) If result is NEGATIVE SARS-CoV-2 target nucleic acids are NOT DETECTED. The SARS-CoV-2 RNA is generally detectable in upper and lower  respiratory specimens during the acute phase of infection. The lowest  concentration of SARS-CoV-2 viral copies this assay can detect is 250  copies / mL. A negative result does not preclude SARS-CoV-2 infection  and should not be used as the sole basis for treatment or other  patient management decisions.  A negative result may occur with  improper specimen collection / handling, submission of specimen other  than nasopharyngeal swab, presence of viral mutation(s) within the  areas targeted by this assay, and  inadequate number of viral copies  (<250 copies / mL). A negative result must be combined with clinical  observations, patient history, and epidemiological information. If result is POSITIVE SARS-CoV-2 target nucleic acids are DETECTED. The SARS-CoV-2 RNA is generally detectable in upper and lower  respiratory specimens dur ing the acute phase of infection.  Positive  results are indicative of active infection with SARS-CoV-2.  Clinical  correlation with patient history and other diagnostic information is  necessary to determine patient infection status.  Positive results do  not rule out bacterial infection or co-infection with other viruses. If result is PRESUMPTIVE POSTIVE SARS-CoV-2 nucleic acids MAY BE PRESENT.   A presumptive positive result was obtained on the submitted specimen  and confirmed on repeat testing.  While 2019 novel coronavirus  (SARS-CoV-2) nucleic acids may be present in the submitted sample  additional confirmatory testing may be necessary for epidemiological  and / or clinical management purposes  to differentiate between  SARS-CoV-2 and other Sarbecovirus currently known to infect humans.  If clinically indicated additional testing with an alternate test  methodology [phone]) is advised. The SARS-CoV-2 RNA is generally  detectable in upper and lower respiratory sp ecimens during the acute  phase of infection. The expected result is Negative. Fact Sheet for Patients:  StrictlyIdeas.no Fact Sheet for Healthcare Providers: BankingDealers.co.za This test is not yet approved or cleared by the [place] FDA and has been authorized for detection and/or diagnosis of SARS-CoV-2 by FDA under an Emergency Use Authorization (EUA).  This EUA will remain in effect (meaning this test can be used) for the duration of the COVID-19 declaration under Section 564(b)(1) of the Act, 21 U.S.C. section 360bbb-3(b)(1), unless the  authorization is terminated or revoked sooner. Performed at [facility], [place] [address] [place], [place] [address]   SARS Coronavirus 2 (CEPHEID- Performed in [place] hospital lab), Hosp Order     Status: None   Collection Time: 12/31/18  5:33 PM   Specimen: Nasopharyngeal Swab  Result Value Ref Range Status   SARS Coronavirus 2 NEGATIVE NEGATIVE Final    Comment: (NOTE) If result is NEGATIVE SARS-CoV-2 target nucleic acids are NOT DETECTED. The SARS-CoV-2 RNA is generally detectable in upper and lower  respiratory specimens  during the acute phase of infection. The lowest  concentration of SARS-CoV-2 viral copies this assay can detect is 250  copies / mL. A negative result does not preclude SARS-CoV-2 infection  and should not be used as the sole basis for treatment or other  patient management decisions.  A negative result may occur with  improper specimen collection / handling, submission of specimen other  than nasopharyngeal swab, presence of viral mutation(s) within the  areas targeted by this assay, and inadequate number of viral copies  (<250 copies / mL). A negative result must be combined with clinical  observations, patient history, and epidemiological information. If result is POSITIVE SARS-CoV-2 target nucleic acids are DETECTED. The SARS-CoV-2 RNA is generally detectable in upper and lower  respiratory specimens dur ing the acute phase of infection.  Positive  results are indicative of active infection with SARS-CoV-2.  Clinical  correlation with patient history and other diagnostic information is  necessary to determine patient infection status.  Positive results do  not rule out bacterial infection or co-infection with other viruses. If result is PRESUMPTIVE POSTIVE SARS-CoV-2 nucleic acids MAY BE PRESENT.   A presumptive positive result was obtained on the submitted specimen  and confirmed on repeat testing.  While 2019 novel coronavirus  (SARS-CoV-2)  nucleic acids may be present in the submitted sample  additional confirmatory testing may be necessary for epidemiological  and / or clinical management purposes  to differentiate between  SARS-CoV-2 and other Sarbecovirus currently known to infect humans.  If clinically indicated additional testing with an alternate test  methodology [phone]) is advised. The SARS-CoV-2 RNA is generally  detectable in upper and lower respiratory sp ecimens during the acute  phase of infection. The expected result is Negative. Fact Sheet for Patients:  BoilerBrush.com.cy Fact Sheet for Healthcare Providers: https://pope.com/ This test is not yet approved or cleared by the [place] FDA and has been authorized for detection and/or diagnosis of SARS-CoV-2 by FDA under an Emergency Use Authorization (EUA).  This EUA will remain in effect (meaning this test can be used) for the duration of the COVID-19 declaration under Section 564(b)(1) of the Act, 21 U.S.C. section 360bbb-3(b)(1), unless the authorization is terminated or revoked sooner. Performed at [facility], [address], [place], [place] [address]      Radiological Exams on Admission: Dg Chest Port 1 View  Result Date: 12/31/2018 CLINICAL DATA:  Shortness of breath. EXAM: PORTABLE CHEST 1 VIEW COMPARISON:  Radiograph of December 25, 2018. FINDINGS: Stable cardiomegaly with central pulmonary vascular congestion is noted. No pneumothorax is noted. Mild bibasilar subsegmental atelectasis is noted with minimal pleural effusions. Bony thorax is unremarkable. IMPRESSION: Stable cardiomegaly with central pulmonary vascular congestion. Mild bibasilar subsegmental atelectasis is noted with minimal pleural effusions. Electronically Signed   By: Lupita Raider M.D.   On: 12/31/2018 18:23    EKG: Independently reviewed.  Sinus tachycardia.  Assessment/Plan Active Problems:   Acute CHF  (congestive heart failure) (HCC)    1. Acute on chronic CHF systolic and diastolic last EF measured in May 2020 was 25 to 30% with severe right ventricular systolic function was followed by cardiologist last week stay -patient has been placed on Lasix 80 mg IV every 12 potassium replacement follow metabolic panel intake output on ACE inhibitor.  Follow daily weights. 2. Penile ulceration -patient states he saw some pus discharge from the ulcer.  On my exam I do not see any pus.  No signs of  any Fournier's on exam.  Will get a CT pelvis keep patient on Zosyn for now until we get a CT pelvis results.  Further recommendation based on the CT results and clinical exam. 3. Cocaine abuse and homelessness -patient admits to taking cocaine yesterday.  Advised about quitting.  Social work consult. 4. Hypertension on lisinopril. 5. Schizophrenia on Abilify and Tegretol.  Check Tegretol levels with next lab draw. 6. Diabetes mellitus type 2 with hyperglycemia -we will keep patient on sliding scale coverage.  Hemoglobin A1c on December 04, 2018 was 9.7. 7. History of intermittent AV block's observed during last stay.  Avoid beta-blockers.  Closely monitor in telemetry. 8. Microcytic hypochromic anemia appears to be chronic.  Follow CBC.  Will check anemia panel.  Given the complexity of the condition including severe anasarca and also possible infection of the penile area patient will require close monitoring and inpatient status.   DVT prophylaxis: Lovenox. Code Status: Full code. Family Communication: Discussed with patient. Disposition Plan: To be determined. Consults called: Social work. Admission status: Inpatient.   Eduard ClosArshad N Charina Fons MD [facility] Pager [phone].  If 7PM-7AM, please contact night-coverage [email or web address] Password [email or web address]  12/31/2018, 7:26 PM

## 2018-12-31 NOTE — Progress Notes (Signed)
Pharmacy Antibiotic Note  [name] is a 58 y.o. male admitted on 12/31/2018 with Intra-abdominal infection.  Pharmacy has been consulted for zosyn dosing.  Plan: Zosyn 3.375g IV q8h (4 hour infusion).  Height: 5\' 9"  (175.3 cm) Weight: 226 lb 10.1 oz (102.8 kg) IBW/kg (Calculated) : 70.7  Temp (24hrs), Avg:98.6 F (37 C), Min:98 F (36.7 C), Max:99.1 F (37.3 C)  Recent Labs  Lab 12/25/18 1612  12/27/18 0259 12/28/18 0524 12/29/18 0808 12/30/18 0349 12/31/18 1757  WBC 8.4  --  5.8  --   --   --  7.6  CREATININE 1.05   < > 0.88 0.87 0.85 0.90 0.99   < > = values in this interval not displayed.    Estimated Creatinine Clearance: 97.2 mL/min (by C-G formula based on SCr of 0.99 mg/dL).    Allergies  Allergen Reactions  . Haldol [Haloperidol] Other (See Comments)    Muscle spasms, loss of voluntary movement. However, pt has taken Thorazine on multiple occasions with no adverse effects.     Antimicrobials this admission: Zosyn 12/31/2018 >>   Dose adjustments this admission: -  Microbiology results: -  Thank you for allowing pharmacy to be a part of this patient's care.  Nani Skillern Crowford 12/31/2018 11:49 PM

## 2019-01-01 ENCOUNTER — Inpatient Hospital Stay (HOSPITAL_COMMUNITY): Payer: Medicare Other

## 2019-01-01 ENCOUNTER — Encounter (HOSPITAL_COMMUNITY): Payer: Self-pay

## 2019-01-01 DIAGNOSIS — R601 Generalized edema: Secondary | ICD-10-CM

## 2019-01-01 DIAGNOSIS — I5043 Acute on chronic combined systolic (congestive) and diastolic (congestive) heart failure: Secondary | ICD-10-CM

## 2019-01-01 DIAGNOSIS — E119 Type 2 diabetes mellitus without complications: Secondary | ICD-10-CM

## 2019-01-01 LAB — IRON AND TIBC
Iron: 23 ug/dL — ABNORMAL LOW (ref 45–182)
Saturation Ratios: 5 % — ABNORMAL LOW (ref 17.9–39.5)
TIBC: 500 ug/dL — ABNORMAL HIGH (ref 250–450)
UIBC: 477 ug/dL

## 2019-01-01 LAB — CBC WITH DIFFERENTIAL/PLATELET
Abs Immature Granulocytes: 0.02 10*3/uL (ref 0.00–0.07)
Basophils Absolute: 0.1 10*3/uL (ref 0.0–0.1)
Basophils Relative: 1 %
Eosinophils Absolute: 0.5 10*3/uL (ref 0.0–0.5)
Eosinophils Relative: 7 %
HCT: 29.5 % — ABNORMAL LOW (ref 39.0–52.0)
Hemoglobin: 8.5 g/dL — ABNORMAL LOW (ref 13.0–17.0)
Immature Granulocytes: 0 %
Lymphocytes Relative: 15 %
Lymphs Abs: 1 10*3/uL (ref 0.7–4.0)
MCH: 22.7 pg — ABNORMAL LOW (ref 26.0–34.0)
MCHC: 28.8 g/dL — ABNORMAL LOW (ref 30.0–36.0)
MCV: 78.9 fL — ABNORMAL LOW (ref 80.0–100.0)
Monocytes Absolute: 0.8 10*3/uL (ref 0.1–1.0)
Monocytes Relative: 13 %
Neutro Abs: 4.1 10*3/uL (ref 1.7–7.7)
Neutrophils Relative %: 64 %
Platelets: 292 10*3/uL (ref 150–400)
RBC: 3.74 MIL/uL — ABNORMAL LOW (ref 4.22–5.81)
RDW: 20.9 % — ABNORMAL HIGH (ref 11.5–15.5)
WBC: 6.5 10*3/uL (ref 4.0–10.5)
nRBC: 0.3 % — ABNORMAL HIGH (ref 0.0–0.2)

## 2019-01-01 LAB — VITAMIN B12: Vitamin B-12: 297 pg/mL (ref 180–914)

## 2019-01-01 LAB — CREATININE, SERUM
Creatinine, Ser: 1.02 mg/dL (ref 0.61–1.24)
GFR calc Af Amer: >60 mL/min (ref >60)
GFR calc non Af Amer: >60 mL/min (ref >60)

## 2019-01-01 LAB — COMPREHENSIVE METABOLIC PANEL
ALT: 13 U/L (ref 0–44)
AST: 23 U/L (ref 15–41)
Albumin: 3 g/dL — ABNORMAL LOW (ref 3.5–5.0)
Alkaline Phosphatase: 191 U/L — ABNORMAL HIGH (ref 38–126)
Anion gap: 9 (ref 5–15)
BUN: 21 mg/dL — ABNORMAL HIGH (ref 6–20)
CO2: 29 mmol/L (ref 22–32)
Calcium: 8.6 mg/dL — ABNORMAL LOW (ref 8.9–10.3)
Chloride: 100 mmol/L (ref 98–111)
Creatinine, Ser: 0.94 mg/dL (ref 0.61–1.24)
GFR calc Af Amer: >60 mL/min (ref >60)
GFR calc non Af Amer: >60 mL/min (ref >60)
Glucose, Bld: 239 mg/dL — ABNORMAL HIGH (ref 70–99)
Potassium: 3.7 mmol/L (ref 3.5–5.1)
Sodium: 138 mmol/L (ref 135–145)
Total Bilirubin: 0.4 mg/dL (ref 0.3–1.2)
Total Protein: 7.3 g/dL (ref 6.5–8.1)

## 2019-01-01 LAB — CBC
HCT: 29.8 % — ABNORMAL LOW (ref 39.0–52.0)
Hemoglobin: 8.7 g/dL — ABNORMAL LOW (ref 13.0–17.0)
MCH: 23.1 pg — ABNORMAL LOW (ref 26.0–34.0)
MCHC: 29.2 g/dL — ABNORMAL LOW (ref 30.0–36.0)
MCV: 79 fL — ABNORMAL LOW (ref 80.0–100.0)
Platelets: 246 10*3/uL (ref 150–400)
RBC: 3.77 MIL/uL — ABNORMAL LOW (ref 4.22–5.81)
RDW: 20.9 % — ABNORMAL HIGH (ref 11.5–15.5)
WBC: 6.7 10*3/uL (ref 4.0–10.5)
nRBC: 0.3 % — ABNORMAL HIGH (ref 0.0–0.2)

## 2019-01-01 LAB — GLUCOSE, CAPILLARY: Glucose-Capillary: 248 mg/dL — ABNORMAL HIGH (ref 70–99)

## 2019-01-01 LAB — TSH: TSH: 1.551 u[IU]/mL (ref 0.350–4.500)

## 2019-01-01 LAB — TROPONIN I (HIGH SENSITIVITY)
Troponin I (High Sensitivity): 21 ng/L — ABNORMAL HIGH (ref <18)
Troponin I (High Sensitivity): 22 ng/L — ABNORMAL HIGH (ref <18)

## 2019-01-01 LAB — FOLATE: Folate: 16.5 ng/mL (ref >5.9)

## 2019-01-01 LAB — RETICULOCYTES
Immature Retic Fract: 40 % — ABNORMAL HIGH (ref 2.3–15.9)
RBC.: 3.77 MIL/uL — ABNORMAL LOW (ref 4.22–5.81)
Retic Count, Absolute: 66.4 10*3/uL (ref 19.0–186.0)
Retic Ct Pct: 1.8 % (ref 0.4–3.1)

## 2019-01-01 LAB — FERRITIN: Ferritin: 20 ng/mL — ABNORMAL LOW (ref 24–336)

## 2019-01-01 LAB — MAGNESIUM: Magnesium: 2 mg/dL (ref 1.7–2.4)

## 2019-01-01 MED ORDER — GLIPIZIDE 5 MG PO TABS: 2.5000 mg | ORAL_TABLET | Freq: Two times a day (BID) | ORAL | 4 refills | Status: DC

## 2019-01-01 MED ORDER — AMOXICILLIN-POT CLAVULANATE 875-125 MG PO TABS: 1.0000 | ORAL_TABLET | Freq: Two times a day (BID) | ORAL | 0 refills | Status: AC

## 2019-01-01 MED ORDER — FUROSEMIDE 40 MG PO TABS: 40.0000 mg | ORAL_TABLET | Freq: Two times a day (BID) | ORAL | 4 refills | Status: DC

## 2019-01-01 MED ORDER — IOHEXOL 300 MG/ML  SOLN
100.0000 mL | Freq: Once | INTRAMUSCULAR | Status: AC | PRN
  Administered 2019-01-01: 100 mL via INTRAVENOUS

## 2019-01-01 MED ORDER — SODIUM CHLORIDE (PF) 0.9 % IJ SOLN
INTRAMUSCULAR | Status: AC
  Filled 2019-01-01: qty 50

## 2019-01-01 NOTE — Progress Notes (Signed)
Inpatient Diabetes Program Recommendations  AACE/ADA: New Consensus Statement on Inpatient Glycemic Control (2015)  Target Ranges:  Prepandial:   less than 140 mg/dL      Peak postprandial:   less than 180 mg/dL (1-2 hours)      Critically ill patients:  140 - 180 mg/dL   Lab Results  Component Value Date   GLUCAP 248 (H) 01/01/2019   HGBA1C 9.7 (H) 12/04/2018    Review of Glycemic Control Results for [name], [name] (MRN [id number]) as of 01/01/2019 08:38  Ref. Range 12/30/2018 11:05 12/31/2018 23:33 01/01/2019 07:38  Glucose-Capillary Latest Ref Range: 70 - 99 mg/dL 222 (H) 254 (H) 248 (H)   Diabetes history: DM 2 Outpatient Diabetes medications: Glipzide 5 mg bid Current orders for Inpatient glycemic control:  Novolog sensitive tid with meals  Inpatient Diabetes Program Recommendations:    Patient was seen by Diabetes coord. On 12/30/18.    Per DC note " Patient says he gets paid tomorrow. Patient said he didn't have a home and as soon as he will be discharged he will be going back to the streets and use crack cocaine".    Very difficult social situation.  While in the hospital, consider adding Lantus 10 units daily. Insulin likely not appropriate for d/c due to social situation.    Thanks,  Adah Perl, RN, BC-ADM Inpatient Diabetes Coordinator Pager [phone]

## 2019-01-01 NOTE — Progress Notes (Signed)
Pt expressed desire to leave hospital at approximately 0850.  States "I have my money today" and will "have a place to stay in the boarding houses on [facility] [address].  Also states he has money to pick up his prescriptions.  RN made pt aware he would be leaving against medical advice.  Pt verbalized understanding of not having discharge orders, but is adamant about leaving.  States a "cousin will be here at 21" to pick him up.  Dr. Tana Coast on floor, notified in person, presented to bedside.  Prescriptions provided.  Pt signed AMA paperwork at [phone] and left facility via wheelchair.

## 2019-01-01 NOTE — Plan of Care (Signed)
Plan of care reviewed and discussed with the patient. 

## 2019-01-01 NOTE — Discharge Summary (Signed)
AMA NOTE    Patient ID: [name] 58 y.o.  Admit date: 12/31/2018 Discharge date: 01/01/2019   PLEASE NOTE THAT PATIENT LEFT AGAINST MEDICAL ADVICE. Risks of not completing treatment, worsening of CHF, respiratory failure and death were explained in detail to the patient and he verbally understood the risks of leaving AMA.   Primary Care Physician:  Patient, No Pcp Per  Discharge Diagnoses:   . 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 Schizophrenia, paranoid type (HCC) . Cocaine use disorder, severe, dependence (HCC) . Severe noncompliance . Aneurysm of abdominal aorta (HCC) . Anemia of chronic disease, iron deficiency Mobitz type II AV block Nicotine abuse Homelessness Diabetes mellitus type 2, NIDDM, uncontrolled  Consults: None   Recommendations for Outpatient Follow-up: Patient left AMA   TESTS THAT NEED FOLLOW-UP Patient left AMA  DIET: Heart healthy diet, carb modified    Allergies:   Allergies  Allergen Reactions  . Haldol [Haloperidol] Other (See Comments)    Muscle spasms, loss of voluntary movement. However, pt has taken Thorazine on multiple occasions with no adverse effects.      Discharge Medications: Please note that patient left AMA (against medical advice)   Brief H and P: For complete details please refer to admission H and P, but in brief patient is a 58 year old male with history of chronic systolic diastolic heart failure last EF measured in May 2022 months ago was showing EF of 25 to 30% with severely reduced right ventricular systolic function with history of cocaine abuse diabetes mellitus schizophrenia chronic anemia who was signed out AGAINST MEDICAL ADVICE yesterday after being admitted for acute CHF and also during the stay was found to have intermittent AV blocks presents to the ER because of worsening lower extremity edema  and genital swelling.  Patient states he has shortness of breath which is chronic.  Denies any chest pain productive cough fever or chills.  He states that his lower extremity edema has increased significantly and also has abdominal distention with no nausea vomiting or diarrhea.  Also noticed increasing discharge from the penile ulcer which [facility] sustained a month ago while using the urinal.  He is noting pus around also. In ED, patient was noted to have significant edema of bilateral lower extremities extending up to the groin, distended abdomen, elevated JVD.  Chest x-ray showed cardiomegaly with vascular congestion, bilateral pleural effusion. BNP 891, patient was admitted for acute CHF and was given Lasix 80 mg IV x1.  Hospital Course:     CHF (congestive heart failure), NYHA class II, acute on chronic, combined (HCC) -Recurrent admissions due to severe noncompliance, patient had just left AMA on 7/1 and returned back with shortness of breath, worsening lower extremity edema, scrotal edema, acute on chronic CHF exacerbation. -He received Lasix 80 mg IV x1 in ED and 1 dose earlier this morning. -Subsequently felt better, refused to continue with further treatment.  RN and myself explained the risks of leaving AMA, patient reported that he had money to buy his medications and he has a ride waiting, so he will not stay any longer. -I gave him the prescription for Lasix although I doubt he will take it.     Day of Discharge BP (!) 155/95 (BP Location: Right Arm)   Pulse 96   Temp 98.4 F (36.9 C) (Oral)   Resp 20   Ht 5\' 9"  (1.753 m)   Wt  103.5 kg   SpO2 94%   BMI 33.70 kg/m   Physical Exam:  Patient left AMA prior to my examination.    The results of significant diagnostics from this hospitalization (including imaging, microbiology, ancillary and laboratory) are listed below for reference.    LAB RESULTS: Basic Metabolic Panel: Recent Labs  Lab 12/31/18 1757 12/31/18 2345  01/01/19 0159  NA 140  --  138  K 3.6  --  3.7  CL 100  --  100  CO2 24  --  29  GLUCOSE 212*  --  239*  BUN 21*  --  21*  CREATININE 0.99 1.02 0.94  CALCIUM 9.1  --  8.6*  MG  --   --  2.0   Liver Function Tests: Recent Labs  Lab 12/31/18 1757 01/01/19 0159  AST 25 23  ALT 14 13  ALKPHOS 197* 191*  BILITOT 0.5 0.4  PROT 7.8 7.3  ALBUMIN 3.3* 3.0*   Recent Labs  Lab 12/25/18 1612  LIPASE 25   No results for input(s): AMMONIA in the last 168 hours. CBC: Recent Labs  Lab 12/31/18 2345 01/01/19 0159  WBC 6.7 6.5  NEUTROABS  --  4.1  HGB 8.7* 8.5*  HCT 29.8* 29.5*  MCV 79.0* 78.9*  PLT 246 292   Cardiac Enzymes: No results for input(s): CKTOTAL, CKMB, CKMBINDEX, TROPONINI in the last 168 hours. BNP: Invalid input(s): POCBNP CBG: Recent Labs  Lab 12/31/18 2333 01/01/19 0738  GLUCAP 254* 248*    Significant Diagnostic Studies:  Ct Pelvis W Contrast  Result Date: 01/01/2019 CLINICAL DATA:  58 y/o M; swelling of genitals for 8 months. Cellulitis of corpus cavernosum and penis. EXAM: CT PELVIS WITH CONTRAST TECHNIQUE: Multidetector CT imaging of the pelvis was performed using the standard protocol following the bolus administration of intravenous contrast. CONTRAST:  146mL OMNIPAQUE IOHEXOL 300 MG/ML  SOLN COMPARISON:  None. FINDINGS: Urinary Tract:  Mild bladder wall thickening. Bowel:  Included bowel is unremarkable. Vascular/Lymphatic: Calcific atherosclerosis of visible iliofemoral arteries which are patent. Reproductive: Prostate enlargement measuring 6.1 x 6.3 x 6.6 cm (volume = 130 cm^3). Diffuse edema of the scrotal wall and superficial soft tissues of the penis. No discrete rim enhancing collection. Other:  Small to moderate volume of peritoneal ascites and anasarca. Musculoskeletal: No acute fracture. IMPRESSION: 1. Diffuse edema of the scrotal wall and superficial soft tissues of penis. No discrete rim enhancing collection. Findings may represent cellulitis  or be secondary to volume overload. 2. Small to moderate volume of peritoneal ascites and anasarca. 3. Prostate enlargement. 4. Mild bladder wall thickening, possibly chronic outflow obstruction or cystitis. Electronically Signed   By: Kristine Garbe M.D.   On: 01/01/2019 01:26   Dg Chest Port 1 View  Result Date: 12/31/2018 CLINICAL DATA:  Shortness of breath. EXAM: PORTABLE CHEST 1 VIEW COMPARISON:  Radiograph of December 25, 2018. FINDINGS: Stable cardiomegaly with central pulmonary vascular congestion is noted. No pneumothorax is noted. Mild bibasilar subsegmental atelectasis is noted with minimal pleural effusions. Bony thorax is unremarkable. IMPRESSION: Stable cardiomegaly with central pulmonary vascular congestion. Mild bibasilar subsegmental atelectasis is noted with minimal pleural effusions. Electronically Signed   By: Marijo Conception M.D.   On: 12/31/2018 18:23    2D ECHO:   Disposition and Follow-up:    DISPOSITION: Patient left AMA. He was advised to seek follow-up with primary care physician.     DISCHARGE FOLLOW-UP    Time spent on Discharge: 20 minutes  Signed:  Thad Rangeripudeep Layten Aiken M.D. [facility] 01/01/2019, 10:32 AM Pager: [phone]

## 2019-01-02 ENCOUNTER — Encounter (HOSPITAL_COMMUNITY): Payer: Self-pay

## 2019-01-02 ENCOUNTER — Emergency Department (HOSPITAL_COMMUNITY): Payer: Medicare Other

## 2019-01-02 ENCOUNTER — Inpatient Hospital Stay (HOSPITAL_COMMUNITY)
Admission: EM | Admit: 2019-01-02 | Discharge: 2019-01-03 | Discharge status: Against Medical Advice | Payer: Medicare Other | Source: Home / Self Care | Attending: Internal Medicine | Admitting: Internal Medicine

## 2019-01-02 ENCOUNTER — Inpatient Hospital Stay (HOSPITAL_COMMUNITY)
Admission: EM | Admit: 2019-01-02 | Discharge: 2019-01-02 | DRG: 291 | Discharge status: Against Medical Advice | Payer: Medicare Other | Attending: Internal Medicine | Admitting: Internal Medicine

## 2019-01-02 ENCOUNTER — Other Ambulatory Visit: Payer: Self-pay

## 2019-01-02 DIAGNOSIS — F17210 Nicotine dependence, cigarettes, uncomplicated: Secondary | ICD-10-CM | POA: Diagnosis present

## 2019-01-02 DIAGNOSIS — Z7984 Long term (current) use of oral hypoglycemic drugs: Secondary | ICD-10-CM

## 2019-01-02 DIAGNOSIS — N5089 Other specified disorders of the male genital organs: Secondary | ICD-10-CM | POA: Diagnosis present

## 2019-01-02 DIAGNOSIS — F419 Anxiety disorder, unspecified: Secondary | ICD-10-CM | POA: Diagnosis present

## 2019-01-02 DIAGNOSIS — F1420 Cocaine dependence, uncomplicated: Secondary | ICD-10-CM | POA: Diagnosis present

## 2019-01-02 DIAGNOSIS — I4430 Unspecified atrioventricular block: Secondary | ICD-10-CM | POA: Diagnosis present

## 2019-01-02 DIAGNOSIS — Z8249 Family history of ischemic heart disease and other diseases of the circulatory system: Secondary | ICD-10-CM | POA: Diagnosis not present

## 2019-01-02 DIAGNOSIS — I5043 Acute on chronic combined systolic (congestive) and diastolic (congestive) heart failure: Secondary | ICD-10-CM | POA: Diagnosis present

## 2019-01-02 DIAGNOSIS — E669 Obesity, unspecified: Secondary | ICD-10-CM | POA: Diagnosis present

## 2019-01-02 DIAGNOSIS — E119 Type 2 diabetes mellitus without complications: Secondary | ICD-10-CM

## 2019-01-02 DIAGNOSIS — I5082 Biventricular heart failure: Secondary | ICD-10-CM | POA: Diagnosis present

## 2019-01-02 DIAGNOSIS — E7800 Pure hypercholesterolemia, unspecified: Secondary | ICD-10-CM

## 2019-01-02 DIAGNOSIS — F19929 Other psychoactive substance use, unspecified with intoxication, unspecified: Secondary | ICD-10-CM | POA: Diagnosis not present

## 2019-01-02 DIAGNOSIS — J9601 Acute respiratory failure with hypoxia: Secondary | ICD-10-CM | POA: Diagnosis present

## 2019-01-02 DIAGNOSIS — Z833 Family history of diabetes mellitus: Secondary | ICD-10-CM

## 2019-01-02 DIAGNOSIS — Z20828 Contact with and (suspected) exposure to other viral communicable diseases: Secondary | ICD-10-CM | POA: Diagnosis present

## 2019-01-02 DIAGNOSIS — F4325 Adjustment disorder with mixed disturbance of emotions and conduct: Secondary | ICD-10-CM | POA: Diagnosis present

## 2019-01-02 DIAGNOSIS — F250 Schizoaffective disorder, bipolar type: Secondary | ICD-10-CM | POA: Diagnosis not present

## 2019-01-02 DIAGNOSIS — R0602 Shortness of breath: Secondary | ICD-10-CM

## 2019-01-02 DIAGNOSIS — E1165 Type 2 diabetes mellitus with hyperglycemia: Secondary | ICD-10-CM | POA: Diagnosis present

## 2019-01-02 DIAGNOSIS — G4733 Obstructive sleep apnea (adult) (pediatric): Secondary | ICD-10-CM | POA: Diagnosis present

## 2019-01-02 DIAGNOSIS — L819 Disorder of pigmentation, unspecified: Secondary | ICD-10-CM | POA: Diagnosis present

## 2019-01-02 DIAGNOSIS — Z9119 Patient's noncompliance with other medical treatment and regimen: Secondary | ICD-10-CM

## 2019-01-02 DIAGNOSIS — E1140 Type 2 diabetes mellitus with diabetic neuropathy, unspecified: Secondary | ICD-10-CM | POA: Diagnosis present

## 2019-01-02 DIAGNOSIS — D509 Iron deficiency anemia, unspecified: Secondary | ICD-10-CM | POA: Diagnosis present

## 2019-01-02 DIAGNOSIS — Z5900 Homelessness unspecified: Secondary | ICD-10-CM

## 2019-01-02 DIAGNOSIS — F200 Paranoid schizophrenia: Secondary | ICD-10-CM | POA: Diagnosis not present

## 2019-01-02 DIAGNOSIS — B1920 Unspecified viral hepatitis C without hepatic coma: Secondary | ICD-10-CM | POA: Diagnosis present

## 2019-01-02 DIAGNOSIS — Z5321 Procedure and treatment not carried out due to patient leaving prior to being seen by health care provider: Secondary | ICD-10-CM | POA: Diagnosis present

## 2019-01-02 DIAGNOSIS — R600 Localized edema: Secondary | ICD-10-CM | POA: Diagnosis not present

## 2019-01-02 DIAGNOSIS — F1424 Cocaine dependence with cocaine-induced mood disorder: Secondary | ICD-10-CM | POA: Diagnosis not present

## 2019-01-02 DIAGNOSIS — Z888 Allergy status to other drugs, medicaments and biological substances status: Secondary | ICD-10-CM

## 2019-01-02 DIAGNOSIS — Z794 Long term (current) use of insulin: Secondary | ICD-10-CM

## 2019-01-02 DIAGNOSIS — R451 Restlessness and agitation: Secondary | ICD-10-CM | POA: Diagnosis not present

## 2019-01-02 DIAGNOSIS — F209 Schizophrenia, unspecified: Secondary | ICD-10-CM | POA: Diagnosis present

## 2019-01-02 DIAGNOSIS — G8929 Other chronic pain: Secondary | ICD-10-CM | POA: Diagnosis present

## 2019-01-02 DIAGNOSIS — I5023 Acute on chronic systolic (congestive) heart failure: Secondary | ICD-10-CM | POA: Diagnosis present

## 2019-01-02 DIAGNOSIS — R140 Abdominal distension (gaseous): Secondary | ICD-10-CM

## 2019-01-02 DIAGNOSIS — K0889 Other specified disorders of teeth and supporting structures: Secondary | ICD-10-CM | POA: Diagnosis present

## 2019-01-02 DIAGNOSIS — E785 Hyperlipidemia, unspecified: Secondary | ICD-10-CM | POA: Diagnosis present

## 2019-01-02 DIAGNOSIS — Z6834 Body mass index (BMI) 34.0-34.9, adult: Secondary | ICD-10-CM | POA: Diagnosis not present

## 2019-01-02 DIAGNOSIS — F1994 Other psychoactive substance use, unspecified with psychoactive substance-induced mood disorder: Secondary | ICD-10-CM | POA: Diagnosis not present

## 2019-01-02 DIAGNOSIS — I509 Heart failure, unspecified: Secondary | ICD-10-CM

## 2019-01-02 DIAGNOSIS — I10 Essential (primary) hypertension: Secondary | ICD-10-CM

## 2019-01-02 DIAGNOSIS — I110 Hypertensive heart disease with heart failure: Principal | ICD-10-CM | POA: Diagnosis present

## 2019-01-02 DIAGNOSIS — Z79899 Other long term (current) drug therapy: Secondary | ICD-10-CM

## 2019-01-02 LAB — CBC
HCT: 31.3 % — ABNORMAL LOW (ref 39.0–52.0)
Hemoglobin: 8.7 g/dL — ABNORMAL LOW (ref 13.0–17.0)
MCH: 22.1 pg — ABNORMAL LOW (ref 26.0–34.0)
MCHC: 27.8 g/dL — ABNORMAL LOW (ref 30.0–36.0)
MCV: 79.6 fL — ABNORMAL LOW (ref 80.0–100.0)
Platelets: 285 10*3/uL (ref 150–400)
RBC: 3.93 MIL/uL — ABNORMAL LOW (ref 4.22–5.81)
RDW: 20.7 % — ABNORMAL HIGH (ref 11.5–15.5)
WBC: 6.7 10*3/uL (ref 4.0–10.5)
nRBC: 0 % (ref 0.0–0.2)

## 2019-01-02 LAB — COMPREHENSIVE METABOLIC PANEL
ALT: 14 U/L (ref 0–44)
AST: 27 U/L (ref 15–41)
Albumin: 3.2 g/dL — ABNORMAL LOW (ref 3.5–5.0)
Alkaline Phosphatase: 186 U/L — ABNORMAL HIGH (ref 38–126)
Anion gap: 12 (ref 5–15)
BUN: 23 mg/dL — ABNORMAL HIGH (ref 6–20)
CO2: 26 mmol/L (ref 22–32)
Calcium: 8.9 mg/dL (ref 8.9–10.3)
Chloride: 101 mmol/L (ref 98–111)
Creatinine, Ser: 1.05 mg/dL (ref 0.61–1.24)
GFR calc Af Amer: >60 mL/min (ref >60)
GFR calc non Af Amer: >60 mL/min (ref >60)
Glucose, Bld: 191 mg/dL — ABNORMAL HIGH (ref 70–99)
Potassium: 3.6 mmol/L (ref 3.5–5.1)
Sodium: 139 mmol/L (ref 135–145)
Total Bilirubin: 0.7 mg/dL (ref 0.3–1.2)
Total Protein: 7.7 g/dL (ref 6.5–8.1)

## 2019-01-02 LAB — CBC WITH DIFFERENTIAL/PLATELET
Abs Immature Granulocytes: 0.02 10*3/uL (ref 0.00–0.07)
Basophils Absolute: 0.1 10*3/uL (ref 0.0–0.1)
Basophils Relative: 1 %
Eosinophils Absolute: 0.2 10*3/uL (ref 0.0–0.5)
Eosinophils Relative: 4 %
HCT: 30.5 % — ABNORMAL LOW (ref 39.0–52.0)
Hemoglobin: 8.6 g/dL — ABNORMAL LOW (ref 13.0–17.0)
Immature Granulocytes: 0 %
Lymphocytes Relative: 17 %
Lymphs Abs: 0.9 10*3/uL (ref 0.7–4.0)
MCH: 22.5 pg — ABNORMAL LOW (ref 26.0–34.0)
MCHC: 28.2 g/dL — ABNORMAL LOW (ref 30.0–36.0)
MCV: 79.6 fL — ABNORMAL LOW (ref 80.0–100.0)
Monocytes Absolute: 0.6 10*3/uL (ref 0.1–1.0)
Monocytes Relative: 10 %
Neutro Abs: 3.7 10*3/uL (ref 1.7–7.7)
Neutrophils Relative %: 68 %
Platelets: 271 10*3/uL (ref 150–400)
RBC: 3.83 MIL/uL — ABNORMAL LOW (ref 4.22–5.81)
RDW: 20.6 % — ABNORMAL HIGH (ref 11.5–15.5)
WBC: 5.5 10*3/uL (ref 4.0–10.5)
nRBC: 0 % (ref 0.0–0.2)

## 2019-01-02 LAB — URINALYSIS, ROUTINE W REFLEX MICROSCOPIC
Bacteria, UA: NONE SEEN
Bilirubin Urine: NEGATIVE
Glucose, UA: NEGATIVE mg/dL
Hgb urine dipstick: NEGATIVE
Ketones, ur: NEGATIVE mg/dL
Leukocytes,Ua: NEGATIVE
Nitrite: NEGATIVE
Protein, ur: 100 mg/dL — AB
Specific Gravity, Urine: 1.026 (ref 1.005–1.030)
pH: 5 (ref 5.0–8.0)

## 2019-01-02 LAB — RAPID URINE DRUG SCREEN, HOSP PERFORMED
Amphetamines: NOT DETECTED
Barbiturates: NOT DETECTED
Benzodiazepines: NOT DETECTED
Cocaine: POSITIVE — AB
Opiates: NOT DETECTED
Tetrahydrocannabinol: NOT DETECTED

## 2019-01-02 LAB — SEDIMENTATION RATE: Sed Rate: 45 mm/hr — ABNORMAL HIGH (ref 0–16)

## 2019-01-02 LAB — GLUCOSE, CAPILLARY
Glucose-Capillary: 179 mg/dL — ABNORMAL HIGH (ref 70–99)
Glucose-Capillary: 205 mg/dL — ABNORMAL HIGH (ref 70–99)

## 2019-01-02 LAB — C-REACTIVE PROTEIN: CRP: 8 mg/dL — ABNORMAL HIGH (ref <1.0)

## 2019-01-02 LAB — SARS CORONAVIRUS 2 BY RT PCR (HOSPITAL ORDER, PERFORMED IN ~~LOC~~ HOSPITAL LAB): SARS Coronavirus 2: NEGATIVE

## 2019-01-02 LAB — TROPONIN I (HIGH SENSITIVITY): Troponin I (High Sensitivity): 22 ng/L — ABNORMAL HIGH (ref <18)

## 2019-01-02 LAB — BRAIN NATRIURETIC PEPTIDE: B Natriuretic Peptide: 764.2 pg/mL — ABNORMAL HIGH (ref 0.0–100.0)

## 2019-01-02 LAB — PREALBUMIN: Prealbumin: 14.6 mg/dL — ABNORMAL LOW (ref 18–38)

## 2019-01-02 LAB — TSH: TSH: 2.178 u[IU]/mL (ref 0.350–4.500)

## 2019-01-02 MED ORDER — ZOLPIDEM TARTRATE 5 MG PO TABS
5.0000 mg | ORAL_TABLET | Freq: Every evening | ORAL | Status: DC | PRN
  Administered 2019-01-02: 5 mg via ORAL
  Filled 2019-01-02: qty 1

## 2019-01-02 MED ORDER — ACETAMINOPHEN 500 MG PO TABS
1000.0000 mg | ORAL_TABLET | Freq: Once | ORAL | Status: AC
  Administered 2019-01-02: 1000 mg via ORAL
  Filled 2019-01-02: qty 2

## 2019-01-02 MED ORDER — ACETAMINOPHEN 325 MG PO TABS
650.0000 mg | ORAL_TABLET | ORAL | Status: DC | PRN
  Administered 2019-01-02 – 2019-01-03 (×2): 650 mg via ORAL
  Filled 2019-01-02 (×3): qty 2

## 2019-01-02 MED ORDER — INSULIN ASPART 100 UNIT/ML ~~LOC~~ SOLN
0.0000 [IU] | Freq: Every day | SUBCUTANEOUS | Status: DC
  Administered 2019-01-02: 2 [IU] via SUBCUTANEOUS

## 2019-01-02 MED ORDER — FUROSEMIDE 10 MG/ML IJ SOLN
40.0000 mg | Freq: Once | INTRAMUSCULAR | Status: AC
  Administered 2019-01-02: 08:00:00 40 mg via INTRAVENOUS
  Filled 2019-01-02: qty 4

## 2019-01-02 MED ORDER — SODIUM CHLORIDE 0.9% FLUSH
3.0000 mL | Freq: Two times a day (BID) | INTRAVENOUS | Status: DC
  Administered 2019-01-02: 3 mL via INTRAVENOUS

## 2019-01-02 MED ORDER — LISINOPRIL 10 MG PO TABS
10.0000 mg | ORAL_TABLET | Freq: Every day | ORAL | Status: DC
  Administered 2019-01-02 – 2019-01-03 (×2): 10 mg via ORAL
  Filled 2019-01-02 (×2): qty 1

## 2019-01-02 MED ORDER — ARIPIPRAZOLE 10 MG PO TABS
10.0000 mg | ORAL_TABLET | Freq: Every day | ORAL | Status: DC
  Administered 2019-01-02 – 2019-01-03 (×2): 10 mg via ORAL
  Filled 2019-01-02 (×2): qty 1

## 2019-01-02 MED ORDER — POTASSIUM CHLORIDE CRYS ER 20 MEQ PO TBCR
20.0000 meq | EXTENDED_RELEASE_TABLET | Freq: Two times a day (BID) | ORAL | Status: DC
  Administered 2019-01-02 – 2019-01-03 (×2): 20 meq via ORAL
  Filled 2019-01-02 (×2): qty 1

## 2019-01-02 MED ORDER — SODIUM CHLORIDE 0.9 % IV SOLN: 250.0000 mL | INTRAVENOUS | Status: DC | PRN

## 2019-01-02 MED ORDER — ALPRAZOLAM 0.25 MG PO TABS
0.2500 mg | ORAL_TABLET | Freq: Two times a day (BID) | ORAL | Status: DC | PRN
  Administered 2019-01-02 – 2019-01-03 (×2): 0.25 mg via ORAL
  Filled 2019-01-02 (×2): qty 1

## 2019-01-02 MED ORDER — CARBAMAZEPINE 200 MG PO TABS
100.0000 mg | ORAL_TABLET | Freq: Two times a day (BID) | ORAL | Status: DC
  Administered 2019-01-02 – 2019-01-03 (×2): 100 mg via ORAL
  Filled 2019-01-02 (×2): qty 0.5

## 2019-01-02 MED ORDER — FUROSEMIDE 10 MG/ML IJ SOLN
80.0000 mg | Freq: Two times a day (BID) | INTRAMUSCULAR | Status: DC
  Administered 2019-01-02 – 2019-01-03 (×2): 80 mg via INTRAVENOUS
  Filled 2019-01-02 (×2): qty 8

## 2019-01-02 MED ORDER — ENOXAPARIN SODIUM 40 MG/0.4ML ~~LOC~~ SOLN
40.0000 mg | SUBCUTANEOUS | Status: DC
  Administered 2019-01-02: 40 mg via SUBCUTANEOUS
  Filled 2019-01-02: qty 0.4

## 2019-01-02 MED ORDER — ONDANSETRON HCL 4 MG/2ML IJ SOLN: 4.0000 mg | Freq: Four times a day (QID) | INTRAMUSCULAR | Status: DC | PRN

## 2019-01-02 MED ORDER — ACETAMINOPHEN 325 MG PO TABS
650.0000 mg | ORAL_TABLET | Freq: Four times a day (QID) | ORAL | Status: DC | PRN
  Administered 2019-01-02: 16:00:00 650 mg via ORAL
  Filled 2019-01-02: qty 2

## 2019-01-02 MED ORDER — INSULIN ASPART 100 UNIT/ML ~~LOC~~ SOLN
0.0000 [IU] | Freq: Three times a day (TID) | SUBCUTANEOUS | Status: DC
  Administered 2019-01-02 – 2019-01-03 (×2): 3 [IU] via SUBCUTANEOUS

## 2019-01-02 MED ORDER — SODIUM CHLORIDE 0.9% FLUSH: 3.0000 mL | INTRAVENOUS | Status: DC | PRN

## 2019-01-02 NOTE — ED Notes (Addendum)
Pt states that he no longer wants to be here to receive care.  Pt made aware of the risks of leaving prior to being discharged.  Pt expressed understanding.  Dr. Alfredia Ferguson paged.  Pt given paper scrubs, IV removed, and taken out to the lobby in a wheelchair. Pt states he will call his cousin to pick him up.

## 2019-01-02 NOTE — ED Notes (Signed)
Pt states he is hungry.  Pt offered a sandwich and declined it.  Instead pt given milk, sprite, and graham crackers for his request.  Pt informed that we were trying to order him a food tray.

## 2019-01-02 NOTE — ED Triage Notes (Signed)
Patient has decided he would like to be admitted.   Patient recently left AMA for the 2nd time today.   Patient never left lobby and now states he does want to stay.     Wheelchair in triage.

## 2019-01-02 NOTE — ED Provider Notes (Signed)
[facility] Provider Note MRN:  [id number]  Arrival date & time: 01/02/19     Chief Complaint   Leg Swelling   History of Present Illness   Taylor MemosFrederick L [name] is a 58 y.o. year-old male with a history of diabetes, hypertension, CHF presenting to the ED with chief complaint of leg swelling.  Patient has been endorsing increased leg swelling for the past few weeks, endorsing dyspnea on exertion, shortness of breath.  Denies chest pain, endorsing increased swelling to the abdomen and scrotum.  Explains that his dry weight is 170 and he weighs 230 at this time.  Explains that he has left the hospital AGAINST MEDICAL ADVICE recently but promises that he will stay at this time.  Is afraid that he will die if he does not get care.  Denies fever, no cough.  Review of Systems  A complete 10 system review of systems was obtained and all systems are negative except as noted in the HPI and PMH.   Patient's Health History    Past Medical History:  Diagnosis Date  . Chronic foot pain   . Cocaine abuse (HCC)   . Diabetes mellitus without complication (HCC)   . Hepatitis C    unsure   . Homelessness   . Hypertension   . Neuropathy   . Polysubstance abuse (HCC)   . Schizophrenia (HCC)   . Sleep apnea   . Systolic and diastolic CHF, chronic (HCC)     Past Surgical History:  Procedure Laterality Date  . MULTIPLE TOOTH EXTRACTIONS      Family History  Problem Relation Age of Onset  . Hypertension Other   . Diabetes Other     Social History   Socioeconomic History  . Marital status: Single    Spouse name: Not on file  . Number of children: Not on file  . Years of education: Not on file  . Highest education level: Not on file  Occupational History  . Not on file  Social Needs  . Financial resource strain: Not on file  . Food insecurity    Worry: Not on file    Inability: Not on file  . Transportation needs    Medical: Not on file   Non-medical: Not on file  Tobacco Use  . Smoking status: Current Every Day Smoker    Packs/day: 1.00    Years: 20.00    Pack years: 20.00    Types: Cigarettes  . Smokeless tobacco: Current User  Substance and Sexual Activity  . Alcohol use: Yes    Comment: Daily Drinker   . Drug use: Yes    Frequency: 7.0 times per week    Types: "Crack" cocaine, Cocaine, Marijuana  . Sexual activity: Not Currently  Lifestyle  . Physical activity    Days per week: Not on file    Minutes per session: Not on file  . Stress: Not on file  Relationships  . Social Musicianconnections    Talks on phone: Not on file    Gets together: Not on file    Attends religious service: Not on file    Active member of club or organization: Not on file    Attends meetings of clubs or organizations: Not on file    Relationship status: Not on file  . Intimate partner violence    Fear of current or ex partner: Not on file    Emotionally abused: Not on file    Physically abused: Not on  file    Forced sexual activity: Not on file  Other Topics Concern  . Not on file  Social History Narrative   ** Merged History Encounter **         Physical Exam  Vital Signs and Nursing Notes reviewed Vitals:   01/02/19 0649 01/02/19 0932  BP: (!) 158/96 (!) 153/98  Pulse: 98 89  Resp: 19 19  Temp:    SpO2: 96% 96%    CONSTITUTIONAL: Chronically ill-appearing, NAD NEURO:  Alert and oriented x 3, no focal deficits EYES:  eyes equal and reactive ENT/NECK:  no LAD, no JVD CARDIO: Regular rate, well-perfused, normal S1 and S2 PULM:  CTAB no wheezing or rhonchi GI/GU:  normal bowel sounds, non-distended, non-tender MSK/SPINE:  No gross deformities, no edema SKIN:  no rash, atraumatic; diffuse anasarca to the legs, scrotum, abdomen PSYCH:  Appropriate speech and behavior  Diagnostic and Interventional Summary    EKG Interpretation  Date/Time:  Saturday January 02 2019 07:41:46 EDT Ventricular Rate:  90 PR Interval:    QRS  Duration: 80 QT Interval:  385 QTC Calculation: 472 R Axis:   10 Text Interpretation:  Sinus rhythm Anterior infarct, old Baseline wander in lead(s) V4 Confirmed by Kennis CarinaBero, Burma Ketcher [phone]([id number]) on 01/02/2019 8:33:04 AM      Labs Reviewed  CBC - Abnormal; Notable for the following components:      Result Value   RBC 3.93 (*)    Hemoglobin 8.7 (*)    HCT 31.3 (*)    MCV 79.6 (*)    MCH 22.1 (*)    MCHC 27.8 (*)    RDW 20.7 (*)    All other components within normal limits  COMPREHENSIVE METABOLIC PANEL - Abnormal; Notable for the following components:   Glucose, Bld 191 (*)    BUN 23 (*)    Albumin 3.2 (*)    Alkaline Phosphatase 186 (*)    All other components within normal limits  URINALYSIS, ROUTINE W REFLEX MICROSCOPIC - Abnormal; Notable for the following components:   Protein, ur 100 (*)    All other components within normal limits  BRAIN NATRIURETIC PEPTIDE - Abnormal; Notable for the following components:   B Natriuretic Peptide 764.2 (*)    All other components within normal limits  SARS CORONAVIRUS 2 (HOSPITAL ORDER, PERFORMED IN [facility])  RAPID URINE DRUG SCREEN, HOSP PERFORMED    DG Chest Port 1 View  Final Result      Medications  acetaminophen (TYLENOL) tablet 1,000 mg (1,000 mg Oral Given 01/02/19 0736)  furosemide (LASIX) injection 40 mg (40 mg Intravenous Given 01/02/19 0802)     Procedures Critical Care  ED Course and Medical Decision Making  I have reviewed the triage vital signs and the nursing notes.  Pertinent labs & imaging results that were available during my care of the patient were reviewed by me and considered in my medical decision making (see below for details).  Presumed continuation of CHF exacerbation in this 58 year old male with history of the same, recent admissions for the same, leaving AMA.  Patient is with normal vital signs here, is clearly fluid overloaded, he has capacity to make decisions based on my exam today, promises  to stay in the hospital this time.  Will provide IV Lasix, admit to hospitalist service.  BNP increased at 700, patient is now requiring 2 L nasal cannula, admitted to hospital service for further care, per hospitalist request will consult patient's cardiologist for recommendations.  Barth Kirks. Sedonia Small, MD [place] [email or web address]  Final Clinical Impressions(s) / ED Diagnoses     ICD-10-CM   1. Acute on chronic congestive heart failure, unspecified heart failure type ([place])  I50.9   2. SOB (shortness of breath)  R06.02 DG Chest [facility]    DG Chest [facility]    ED Discharge Orders    None         Maudie Flakes, MD 01/02/19 [phone]

## 2019-01-02 NOTE — ED Notes (Signed)
Pt resting comfortably in room. No complaints.

## 2019-01-02 NOTE — H&P (Signed)
History and Physical    [name]:[id number] DOB: 09-Dec-1960 DOA: 01/02/2019  PCP: Patient, No Pcp Per   Patient coming from: Home  Chief Complaint: SOB; Swelling, Worried that he is going to lose his foot  HPI: [name] is a 58 y.o. male with medical history significant for significant cocaine abuse, uncontrolled diabetes mellitus type 2, homelessness, hypertension, hepatitis C, schizophrenia, sleep apnea, chronic combined systolic and diastolic CHF, along with other comorbidities presented early this morning with a chief complaint of worsening leg swelling as well as shortness of breath on exertion.  Of note he was just recently hospitalized yesterday and discharged when he left AGAINST MEDICAL ADVICE.  Today patient's main complaint was that he is concerned about getting a meal and his left foot being cut off because of his diabetes.  This morning I went to go see the patient and he was going to be admitted and he left AGAINST MEDICAL ADVICE and went and waited in the waiting room but then came back to the ED for reevaluation and decided he want to get admitted again.  He almost left AGAINST MEDICAL ADVICE a second time and he has a known history of coming to the hospital and leaving AGAINST MEDICAL ADVICE.  Patient was also complaining of increased swelling in his scrotum and a penile cut.  Denied any nausea or vomiting, denies any lightheadedness or dizziness.  Patient does tell me that yesterday he signed out against the hospital will do cocaine and he came back for worsening leg swelling today.  On evaluation and early in the morning he was hypoxic and then given a dose of Lasix and improved his respiratory status and no longer required oxygen.  Patient is concerned about the skin discoloration on his foot however there is no visible ulcer noted.  [facility] was asked admit this patient for acute respiratory failure with hypoxia in the setting of acute on chronic combined systolic  and diastolic CHF with biventricular failure.  I have asked cardiology to evaluate the patient given his significant biventricular failure and significant anasarca from CHF.  ED Course: Initial ED course the patient was worked up and given a dose of IV Lasix and had basic blood work done.  Second ED course patient did not have any repeat blood work done as he did not actually leave the hospital when he signed out AMA and was reevaluated.  Review of Systems: As per HPI otherwise all other systems reviewed and negative.   Past Medical History:  Diagnosis Date   Chronic foot pain    Cocaine abuse (HCC)    Diabetes mellitus without complication (HCC)    Hepatitis C    unsure    Homelessness    Hypertension    Neuropathy    Polysubstance abuse (HCC)    Schizophrenia (HCC)    Sleep apnea    Systolic and diastolic CHF, chronic (HCC)    Past Surgical History:  Procedure Laterality Date   MULTIPLE TOOTH EXTRACTIONS     SOCIAL HISTORY   reports that he has been smoking cigarettes. He has a 20.00 pack-year smoking history. He uses smokeless tobacco. He reports current alcohol use. He reports current drug use. Frequency: 7.00 times per week. Drugs: "Crack" cocaine, Cocaine, and Marijuana.  Allergies  Allergen Reactions   Haldol [Haloperidol] Other (See Comments)    Muscle spasms, loss of voluntary movement. However, pt has taken Thorazine on multiple occasions with no adverse effects.    Family  History  Problem Relation Age of Onset   Hypertension Other    Diabetes Other    Prior to Admission medications   Medication Sig Start Date End Date Taking? Authorizing Provider  amoxicillin-clavulanate (AUGMENTIN) 875-125 MG tablet Take 1 tablet by mouth 2 (two) times daily for 10 days. 01/01/19 01/11/19  Rai, Ripudeep K, MD  ARIPiprazole (ABILIFY) 10 MG tablet Take 1 tablet (10 mg total) by mouth daily. 12/10/18   Pokhrel, Rebekah Chesterfield, MD  carbamazepine (TEGRETOL) 200 MG tablet Take 0.5  tablets (100 mg total) by mouth 2 (two) times daily. 11/26/18   Mancel Bale, MD  furosemide (LASIX) 40 MG tablet Take 1 tablet (40 mg total) by mouth 2 (two) times daily. 01/01/19 05/31/19  Rai, Ripudeep Kirtland Bouchard, MD  glipiZIDE (GLUCOTROL) 5 MG tablet Take 0.5 tablets (2.5 mg total) by mouth 2 (two) times daily before a meal. 01/01/19   Rai, Ripudeep K, MD  Ipratropium-Albuterol (COMBIVENT) 20-100 MCG/ACT AERS respimat Inhale 1 puff into the lungs every 6 (six) hours. 11/30/18   Palumbo, April, MD  lisinopril (ZESTRIL) 10 MG tablet Take 1 tablet (10 mg total) by mouth daily. 11/26/18   Mancel Bale, MD  potassium chloride SA (K-DUR) 20 MEQ tablet Take 1 tablet (20 mEq total) by mouth 2 (two) times daily. 11/30/18   Palumbo, April, MD   Physical Exam: Vitals:   01/02/19 1634 01/02/19 1642 01/02/19 1816  BP: (!) 154/92    Pulse: 91    Resp: 20    Temp: 98.3 F (36.8 C)    TempSrc: Oral    SpO2: 91% 94%   Weight:   105.1 kg  Height:   5\' 9"  (1.753 m)   Constitutional: WN/WD Obese AAM Who is significantly agitated and cursing at staff Eyes: Lids and conjunctivae normal, sclerae anicteric  ENMT: External Ears, Nose appear normal. Grossly normal hearing. Mucous membranes are moist. Poor Dentition  Neck: Appears normal, supple, no cervical masses, normal ROM, no appreciable thyromegaly; Some JVD Respiratory: Diminished to auscultation bilaterally, no wheezing, rales, rhonchi or crackles. Normal respiratory effort and patient is not tachypenic. No accessory muscle use.  Cardiovascular: RRR, no murmurs / rubs / gallops. S1 and S2 auscultated. 2+ LE extremity edema.  Abdomen: Soft,mildy tender, Distended due to body habitus and ascites. Has an umbilical hernia. Bowel sounds positive x4.  GU: Has a penile wound on the left side of his shaft and Massive scrotal Swelling Musculoskeletal: No clubbing / cyanosis of digits/nails. No joint deformity upper and lower extremities. Skin: No rashes, lesions, ulcers but  patient has a penile wound and bilateral LE foot and leg discolration. No induration; Warm and dry.  Neurologic: CN 2-12 grossly intact with no focal deficits.  Romberg sign and cerebellar reflexes not assessed.  Psychiatric: Extremely agitated and frustrated and he is awake and alert  Labs on Admission: I have personally reviewed following labs and imaging studies  CBC: Recent Labs  Lab 12/31/18 1757 12/31/18 2345 01/01/19 0159 01/02/19 0758 01/02/19 1640  WBC 7.6 6.7 6.5 6.7 5.5  NEUTROABS 5.8  --  4.1  --  3.7  HGB 8.8* 8.7* 8.5* 8.7* 8.6*  HCT 31.1* 29.8* 29.5* 31.3* 30.5*  MCV 78.9* 79.0* 78.9* 79.6* 79.6*  PLT 322 246 292 285 271   Basic Metabolic Panel: Recent Labs  Lab 12/29/18 0808 12/30/18 0349 12/31/18 1757 12/31/18 2345 01/01/19 0159 01/02/19 0758  NA 136 139 140  --  138 139  K 3.9 4.1 3.6  --  3.7  3.6  CL 98 99 100  --  100 101  CO2 31 31 24   --  29 26  GLUCOSE 200* 256* 212*  --  239* 191*  BUN 14 16 21*  --  21* 23*  CREATININE 0.85 0.90 0.99 1.02 0.94 1.05  CALCIUM 8.7* 9.0 9.1  --  8.6* 8.9  MG  --   --   --   --  2.0  --    GFR: Estimated Creatinine Clearance: 92.8 mL/min (by C-G formula based on SCr of 1.05 mg/dL). Liver Function Tests: Recent Labs  Lab 12/31/18 1757 01/01/19 0159 01/02/19 0758  AST 25 23 27   ALT 14 13 14   ALKPHOS 197* 191* 186*  BILITOT 0.5 0.4 0.7  PROT 7.8 7.3 7.7  ALBUMIN 3.3* 3.0* 3.2*   No results for input(s): LIPASE, AMYLASE in the last 168 hours. No results for input(s): AMMONIA in the last 168 hours. Coagulation Profile: No results for input(s): INR, PROTIME in the last 168 hours. Cardiac Enzymes: No results for input(s): CKTOTAL, CKMB, CKMBINDEX, TROPONINI in the last 168 hours. BNP (last 3 results) No results for input(s): PROBNP in the last 8760 hours. HbA1C: No results for input(s): HGBA1C in the last 72 hours. CBG: Recent Labs  Lab 12/30/18 0530 12/30/18 1105 12/31/18 2333 01/01/19 0738  01/02/19 1653  GLUCAP 212* 222* 254* 248* 179*   Lipid Profile: No results for input(s): CHOL, HDL, LDLCALC, TRIG, CHOLHDL, LDLDIRECT in the last 72 hours. Thyroid Function Tests: Recent Labs    01/02/19 1654  TSH 2.178   Anemia Panel: Recent Labs    12/31/18 2345  VITAMINB12 297  FOLATE 16.5  FERRITIN 20*  TIBC 500*  IRON 23*  RETICCTPCT 1.8   Urine analysis:    Component Value Date/Time   COLORURINE YELLOW 01/02/2019 0724   APPEARANCEUR CLEAR 01/02/2019 0724   LABSPEC 1.026 01/02/2019 0724   PHURINE 5.0 01/02/2019 0724   GLUCOSEU NEGATIVE 01/02/2019 0724   HGBUR NEGATIVE 01/02/2019 0724   BILIRUBINUR NEGATIVE 01/02/2019 0724   KETONESUR NEGATIVE 01/02/2019 0724   PROTEINUR 100 (A) 01/02/2019 0724   UROBILINOGEN 1.0 03/14/2015 0610   NITRITE NEGATIVE 01/02/2019 0724   LEUKOCYTESUR NEGATIVE 01/02/2019 0724   Sepsis Labs: !!!!!!!!!!!!!!!!!!!!!!!!!!!!!!!!!!!!!!!!!!!! @LABRCNTIP (procalcitonin:4,lacticidven:4) ) Recent Results (from the past 240 hour(s))  SARS Coronavirus 2 (CEPHEID - Performed in [place] hospital lab), Hosp Order     Status: None   Collection Time: 12/25/18  8:17 PM   Specimen: Nasopharyngeal Swab  Result Value Ref Range Status   SARS Coronavirus 2 NEGATIVE NEGATIVE Final    Comment: (NOTE) If result is NEGATIVE SARS-CoV-2 target nucleic acids are NOT DETECTED. The SARS-CoV-2 RNA is generally detectable in upper and lower  respiratory specimens during the acute phase of infection. The lowest  concentration of SARS-CoV-2 viral copies this assay can detect is 250  copies / mL. A negative result does not preclude SARS-CoV-2 infection  and should not be used as the sole basis for treatment or other  patient management decisions.  A negative result may occur with  improper specimen collection / handling, submission of specimen other  than nasopharyngeal swab, presence of viral mutation(s) within the  areas targeted by this assay, and inadequate  number of viral copies  (<250 copies / mL). A negative result must be combined with clinical  observations, patient history, and epidemiological information. If result is POSITIVE SARS-CoV-2 target nucleic acids are DETECTED. The SARS-CoV-2 RNA is generally detectable in upper and lower  respiratory specimens dur ing the acute phase of infection.  Positive  results are indicative of active infection with SARS-CoV-2.  Clinical  correlation with patient history and other diagnostic information is  necessary to determine patient infection status.  Positive results do  not rule out bacterial infection or co-infection with other viruses. If result is PRESUMPTIVE POSTIVE SARS-CoV-2 nucleic acids MAY BE PRESENT.   A presumptive positive result was obtained on the submitted specimen  and confirmed on repeat testing.  While 2019 novel coronavirus  (SARS-CoV-2) nucleic acids may be present in the submitted sample  additional confirmatory testing may be necessary for epidemiological  and / or clinical management purposes  to differentiate between  SARS-CoV-2 and other Sarbecovirus currently known to infect humans.  If clinically indicated additional testing with an alternate test  methodology [phone]) is advised. The SARS-CoV-2 RNA is generally  detectable in upper and lower respiratory sp ecimens during the acute  phase of infection. The expected result is Negative. Fact Sheet for Patients:  BoilerBrush.com.cy Fact Sheet for Healthcare Providers: https://pope.com/ This test is not yet approved or cleared by the [place] FDA and has been authorized for detection and/or diagnosis of SARS-CoV-2 by FDA under an Emergency Use Authorization (EUA).  This EUA will remain in effect (meaning this test can be used) for the duration of the COVID-19 declaration under Section 564(b)(1) of the Act, 21 U.S.C. section 360bbb-3(b)(1), unless the  authorization is terminated or revoked sooner. Performed at [facility], [address] [place], [place] [address]   SARS Coronavirus 2 (CEPHEID- Performed in [facility] hospital lab), Hosp Order     Status: None   Collection Time: 12/31/18  5:33 PM   Specimen: Nasopharyngeal Swab  Result Value Ref Range Status   SARS Coronavirus 2 NEGATIVE NEGATIVE Final    Comment: (NOTE) If result is NEGATIVE SARS-CoV-2 target nucleic acids are NOT DETECTED. The SARS-CoV-2 RNA is generally detectable in upper and lower  respiratory specimens during the acute phase of infection. The lowest  concentration of SARS-CoV-2 viral copies this assay can detect is 250  copies / mL. A negative result does not preclude SARS-CoV-2 infection  and should not be used as the sole basis for treatment or other  patient management decisions.  A negative result may occur with  improper specimen collection / handling, submission of specimen other  than nasopharyngeal swab, presence of viral mutation(s) within the  areas targeted by this assay, and inadequate number of viral copies  (<250 copies / mL). A negative result must be combined with clinical  observations, patient history, and epidemiological information. If result is POSITIVE SARS-CoV-2 target nucleic acids are DETECTED. The SARS-CoV-2 RNA is generally detectable in upper and lower  respiratory specimens dur ing the acute phase of infection.  Positive  results are indicative of active infection with SARS-CoV-2.  Clinical  correlation with patient history and other diagnostic information is  necessary to determine patient infection status.  Positive results do  not rule out bacterial infection or co-infection with other viruses. If result is PRESUMPTIVE POSTIVE SARS-CoV-2 nucleic acids MAY BE PRESENT.   A presumptive positive result was obtained on the submitted specimen  and confirmed on repeat testing.  While 2019 novel coronavirus  (SARS-CoV-2)  nucleic acids may be present in the submitted sample  additional confirmatory testing may be necessary for epidemiological  and / or clinical management purposes  to differentiate between  SARS-CoV-2 and other Sarbecovirus currently known to infect  humans.  If clinically indicated additional testing with an alternate test  methodology [phone]([id number]) is advised. The SARS-CoV-2 RNA is generally  detectable in upper and lower respiratory sp ecimens during the acute  phase of infection. The expected result is Negative. Fact Sheet for Patients:  BoilerBrush.com.cyhttps://www.fda.gov/media/136312/download Fact Sheet for Healthcare Providers: https://pope.com/https://www.fda.gov/media/136313/download This test is not yet approved or cleared by the [place] FDA and has been authorized for detection and/or diagnosis of SARS-CoV-2 by FDA under an Emergency Use Authorization (EUA).  This EUA will remain in effect (meaning this test can be used) for the duration of the COVID-19 declaration under Section 564(b)(1) of the Act, 21 U.S.C. section 360bbb-3(b)(1), unless the authorization is terminated or revoked sooner. Performed at [facility], [address] [place], [place] [address]   SARS Coronavirus 2 (CEPHEID - Performed in [facility] hospital lab), Hosp Order     Status: None   Collection Time: 01/02/19  9:20 AM   Specimen: Nasopharyngeal Swab  Result Value Ref Range Status   SARS Coronavirus 2 NEGATIVE NEGATIVE Final    Comment: (NOTE) If result is NEGATIVE SARS-CoV-2 target nucleic acids are NOT DETECTED. The SARS-CoV-2 RNA is generally detectable in upper and lower  respiratory specimens during the acute phase of infection. The lowest  concentration of SARS-CoV-2 viral copies this assay can detect is 250  copies / mL. A negative result does not preclude SARS-CoV-2 infection  and should not be used as the sole basis for treatment or other  patient management decisions.  A negative result may occur  with  improper specimen collection / handling, submission of specimen other  than nasopharyngeal swab, presence of viral mutation(s) within the  areas targeted by this assay, and inadequate number of viral copies  (<250 copies / mL). A negative result must be combined with clinical  observations, patient history, and epidemiological information. If result is POSITIVE SARS-CoV-2 target nucleic acids are DETECTED. The SARS-CoV-2 RNA is generally detectable in upper and lower  respiratory specimens dur ing the acute phase of infection.  Positive  results are indicative of active infection with SARS-CoV-2.  Clinical  correlation with patient history and other diagnostic information is  necessary to determine patient infection status.  Positive results do  not rule out bacterial infection or co-infection with other viruses. If result is PRESUMPTIVE POSTIVE SARS-CoV-2 nucleic acids MAY BE PRESENT.   A presumptive positive result was obtained on the submitted specimen  and confirmed on repeat testing.  While 2019 novel coronavirus  (SARS-CoV-2) nucleic acids may be present in the submitted sample  additional confirmatory testing may be necessary for epidemiological  and / or clinical management purposes  to differentiate between  SARS-CoV-2 and other Sarbecovirus currently known to infect humans.  If clinically indicated additional testing with an alternate test  methodology [phone]([id number]) is advised. The SARS-CoV-2 RNA is generally  detectable in upper and lower respiratory sp ecimens during the acute  phase of infection. The expected result is Negative. Fact Sheet for Patients:  BoilerBrush.com.cyhttps://www.fda.gov/media/136312/download Fact Sheet for Healthcare Providers: https://pope.com/https://www.fda.gov/media/136313/download This test is not yet approved or cleared by the [place] FDA and has been authorized for detection and/or diagnosis of SARS-CoV-2 by FDA under an Emergency Use Authorization (EUA).  This EUA  will remain in effect (meaning this test can be used) for the duration of the COVID-19 declaration under Section 564(b)(1) of the Act, 21 U.S.C. section 360bbb-3(b)(1), unless the authorization is terminated or revoked sooner. Performed at [facility], [address] [place], [place] [address]      Radiological Exams on Admission: Ct Pelvis W Contrast  Result Date: 01/01/2019 CLINICAL DATA:  58 y/o M; swelling of genitals for 8 months. Cellulitis of corpus cavernosum and penis. EXAM: CT PELVIS WITH CONTRAST TECHNIQUE: Multidetector CT imaging of the pelvis was performed using the standard protocol following the bolus administration of intravenous contrast. CONTRAST:  100mL OMNIPAQUE IOHEXOL 300 MG/ML  SOLN COMPARISON:  None. FINDINGS: Urinary Tract:  Mild bladder wall thickening. Bowel:  Included bowel is unremarkable. Vascular/Lymphatic: Calcific atherosclerosis of visible iliofemoral arteries which are patent. Reproductive: Prostate enlargement measuring 6.1 x 6.3 x 6.6 cm (volume = 130 cm^3). Diffuse edema of the scrotal wall and superficial soft tissues of the penis. No discrete rim enhancing collection. Other:  Small to moderate volume of peritoneal ascites and anasarca. Musculoskeletal: No acute fracture. IMPRESSION: 1. Diffuse edema of the scrotal wall and superficial soft tissues of penis. No discrete rim enhancing collection. Findings may represent cellulitis or be secondary to volume overload. 2. Small to moderate volume of peritoneal ascites and anasarca. 3. Prostate enlargement. 4. Mild bladder wall thickening, possibly chronic outflow obstruction or cystitis. Electronically Signed   By: Mitzi HansenLance  Furusawa-Stratton M.D.   On: 01/01/2019 01:26   Dg Chest Port 1 View  Result Date: 01/02/2019 CLINICAL DATA:  Cough, shortness of breath, leg swelling. EXAM: PORTABLE CHEST 1 VIEW COMPARISON:  Chest x-ray dated December 31, 2018. FINDINGS: Stable cardiomegaly. Unchanged mild  pulmonary vascular congestion. Unchanged trace bilateral pleural effusions and mild bibasilar atelectasis. No consolidation or pneumothorax. No acute osseous abnormality. IMPRESSION: 1. Unchanged mild pulmonary vascular congestion and trace bilateral pleural effusions. Electronically Signed   By: Obie DredgeWilliam T Derry M.D.   On: 01/02/2019 08:23   EKG: Independently reviewed.  Sinus rhythm at a rate of 7499 old anterior infarct and a QTC of 474 on my interpretation  Assessment/Plan Active Problems:   Insulin-requiring or dependent type II diabetes mellitus (HCC)   Cocaine use disorder, severe, dependence (HCC)   Substance or medication-induced bipolar and related disorder with onset during intoxication (HCC)   Chronic paranoid schizophrenia (HCC)   Schizophrenia, paranoid type (HCC)   Substance induced mood disorder (HCC)   Schizoaffective disorder, bipolar type (HCC)   Chronic foot pain   Homelessness   Acute on chronic combined systolic and diastolic CHF (congestive heart failure) (HCC)   Scrotal swelling   Adjustment disorder with mixed disturbance of emotions and conduct   HLD (hyperlipidemia)   Anxiety   CHF (congestive heart failure), NYHA class II, acute on chronic, combined (HCC)   Diabetes mellitus without complication (HCC)   Acute on chronic combined systolic (congestive) and diastolic (congestive) heart failure (HCC)  Acute Respiratory Failure with Hypoxia 2/2 Acute CHF Exacerbation -Initially desaturated to 86% however after Lasix was given no longer required oxygen -Continuous pulse oximetry and maintain O2 saturation greater than 90% -Continue supplemental oxygen via nasal cannula and wean O2 as tolerated -Chest x-ray showed unchanged mild pulmonary vascular congestion.  Bilateral pleural effusions -Continue monitor and repeat chest x-ray in the a.m.  Acute on Chronic Combined Systolic CHF complicated by anasarca -Appears significantly volume overloaded on examination and has  diffuse anasarca with significant abdominal swelling -chest x-ray was as above -BNP on admission was 764.2 -Troponins were flat at 22.0 -Strict I's and O's, daily weights, -Continue lisinopril but will hold beta-blocker given cocaine use -Started IV diuresis with IV Lasix 80 mg twice daily -We  will have cardiology evaluate for further recommendations  Microcytic Anemia -HemoGlobin/hematocrit admission was 8.6/30.5 -Check anemia panel in the a.m. -Continue monitor for signs and symptoms of bleeding; currently no overt bleeding noted -Continue monitor and trend repeat CBC in a.m.  Hyperglycemia in the setting of Diabetes Mellitus Type 2 -Uncontrolled -Last hemoglobin A1c was 9.7 -We will place on moderate NovoLog sliding scale insulin before meals and at bedtime along with a heart healthy carb modified diet  Hx of Cocaine Abuse -UDS was positive for cocaine -Counseling given  Schizoprenia  -We will consult psychiatry for his mania and his agitation -Threatened to kill himself however he is not actually suicidal as he stated this because he did not get a meal -We will continue safety precautions with a telemetry sitter -Continue alprazolam 0.5 mg p.o. twice daily as needed anxiety, aripiprazole 10 mg p.o. daily, carbamazepine regular p.o. twice daily -We will need to rule out infection and will obtain blood cultures x2  OSA -We will add CPAP at night  Homelessness -We will need a social work consultation  Hepatitis C -LFTs are normal -Continue monitor and trend LFTs -We will need outpatient treatment of hepatitis C if not already done so -We will obtain an abdominal ultrasound of the right upper quadrant given his abdominal swelling with concern for ascites and anasarca -May need a paracentesis  Left foot discoloration -Patient feels there is a ulcer secondary to his diabetes however I do not see any ulcer -We will check ABIs -Patient will need to manage his  diabetes better   Penile wound and scrotal swelling -In the setting of urinal at bedside -We will have wound nurse consult -Recent CT scan on 01/28/2019 showed diffuse edema of the scrotal wall and superficial soft tissues of the penis with no discrete rim-enhancing collection.  Findings likely represent a cellulitis or could be secondary to volume overload however feel this is less likely secondary to cellulitis.  Is also small to moderate volume of peritoneal ascites and anasarca as well as prostate enlargement and mild bladder wall thickening likely secondary to chronic outflow obstruction versus diabetic  Obesity -Estimated body mass index is 34.22 kg/m as calculated from the following:   Height as of this encounter: 5\' 9"  (1.753 m).   Weight as of this encounter: 105.1 kg. -Weight Loss and Dietary Counseling given   DVT prophylaxis: Enoxaparin 40 mg subcu every 24 hours Code Status: FULL CODE  Family Communication: No family present at bedside  Disposition Plan: Pending further workup and treatment if he does not sign out AMA again  Consults called: Cardiology Dr. Jens Somrenshaw; Psychiatry  Admission status: Inpatient Telemetry   Severity of Illness: The appropriate patient status for this patient is INPATIENT. Inpatient status is judged to be reasonable and necessary in order to provide the required intensity of service to ensure the patient's safety. The patient's presenting symptoms, physical exam findings, and initial radiographic and laboratory data in the context of their chronic comorbidities is felt to place them at high risk for further clinical deterioration. Furthermore, it is not anticipated that the patient will be medically stable for discharge from the hospital within 2 midnights of admission. The following factors support the patient status of inpatient.   " The patient's presenting symptoms include Leg Swelling. " The worrisome physical exam findings include Anasarca. " The  initial radiographic and laboratory data are worrisome because of Volume Overload . " The chronic co-morbidities are listed as above  * I certify that  at the point of admission it is my clinical judgment that the patient will require inpatient hospital care spanning beyond 2 midnights from the point of admission due to high intensity of service, high risk for further deterioration and high frequency of surveillance required.Merlene Laughter, D.O. [facility] is on AMION  If 7PM-7AM, please contact night-coverage [email or web address] Password [facility]  01/02/2019, 7:44 PM

## 2019-01-02 NOTE — ED Notes (Signed)
Dr Bero at bedside.  

## 2019-01-02 NOTE — ED Notes (Signed)
Pt informed that he would be getting a COVID swab.  Pt expressed understanding.  Pt asking about food tray and was unhappy to hear that it had not arrived yet. Pt starts to cry and yell that he wants to leave because we are not feeding pt.

## 2019-01-02 NOTE — ED Notes (Signed)
Warren made aware that pt left AMA.

## 2019-01-02 NOTE — ED Notes (Signed)
Bed: WA23 Expected date:  Expected time:  Means of arrival:  Comments: 
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
## 2019-01-02 NOTE — ED Notes (Signed)
Pt. Stated he wanted to leave due to not getting a breakfast tray. Pt. Attempted to take off b/p cuff along with pulling on the IV line. Nurse aware of pt.

## 2019-01-02 NOTE — Progress Notes (Addendum)
I was called by the ED physician Dr. Gerlene Fee for admission of this patient for acute on chronic systolic and diastolic CHF exacerbation along with acute respiratory failure with hypoxia.  Patient recently left AGAINST MEDICAL ADVICE yesterday and came back to the hospital for similar presentation today.  I came down to speak with the patient and while I was walking to his room he was screaming and yelling at the ED physician along with the nursing staff because he did not get his breakfast tray and he wanted to take a shower. I went and spoke with the patient about admission and calm them down and he was agreeable to admission for diuresis and improvement, however prior to going to the floor he left [place] because he did not get his breakfast tray.  Patient is aware of the risks of leaving [place] and is of sound mind to make good decisions though he is very agitated with the hospital staff because not being "fed and not be able to take a shower".  He was to be admitted for the following.   Assessment/Plan Active Problems:   Acute on chronic systolic CHF (congestive heart failure) (HCC)   Acute Respiratory Failure with Hypoxia 2/2 Acute CHF Exacerbation  Acute on Chronic Combined Systolic CHF  Microcytic Anemia  Hyperglycemia in the setting of Diabetes Mellitus Type 2  Hx of Cocaine Abuse  Schizoprenia   OSA  Homelessness  Hepatitis C  Patient is a frequent flyer and has had at least 7 ED visits in June and 1 in July so far.  He is likely going to come back to the hospital for further evaluation recommendations as he is done this multiple times.  Kerney Elbe, D.O. [facility] is on [place]  If 7PM-7AM, please contact night-coverage [email or web address] Password [facility]  01/02/2019, 9:47 AM

## 2019-01-03 ENCOUNTER — Encounter (HOSPITAL_COMMUNITY): Payer: Medicaid Other

## 2019-01-03 ENCOUNTER — Inpatient Hospital Stay (HOSPITAL_COMMUNITY): Payer: Medicare Other

## 2019-01-03 DIAGNOSIS — R451 Restlessness and agitation: Secondary | ICD-10-CM

## 2019-01-03 DIAGNOSIS — F419 Anxiety disorder, unspecified: Secondary | ICD-10-CM

## 2019-01-03 DIAGNOSIS — F4325 Adjustment disorder with mixed disturbance of emotions and conduct: Secondary | ICD-10-CM

## 2019-01-03 DIAGNOSIS — F1424 Cocaine dependence with cocaine-induced mood disorder: Secondary | ICD-10-CM

## 2019-01-03 DIAGNOSIS — R140 Abdominal distension (gaseous): Secondary | ICD-10-CM

## 2019-01-03 DIAGNOSIS — I5043 Acute on chronic combined systolic (congestive) and diastolic (congestive) heart failure: Secondary | ICD-10-CM

## 2019-01-03 DIAGNOSIS — I10 Essential (primary) hypertension: Secondary | ICD-10-CM

## 2019-01-03 LAB — GLUCOSE, CAPILLARY
Glucose-Capillary: 156 mg/dL — ABNORMAL HIGH (ref 70–99)
Glucose-Capillary: 182 mg/dL — ABNORMAL HIGH (ref 70–99)

## 2019-01-03 LAB — BASIC METABOLIC PANEL
Anion gap: 8 (ref 5–15)
BUN: 27 mg/dL — ABNORMAL HIGH (ref 6–20)
CO2: 29 mmol/L (ref 22–32)
Calcium: 8.6 mg/dL — ABNORMAL LOW (ref 8.9–10.3)
Chloride: 101 mmol/L (ref 98–111)
Creatinine, Ser: 0.91 mg/dL (ref 0.61–1.24)
GFR calc Af Amer: >60 mL/min (ref >60)
GFR calc non Af Amer: >60 mL/min (ref >60)
Glucose, Bld: 175 mg/dL — ABNORMAL HIGH (ref 70–99)
Potassium: 3.6 mmol/L (ref 3.5–5.1)
Sodium: 138 mmol/L (ref 135–145)

## 2019-01-03 MED ORDER — PIPERACILLIN-TAZOBACTAM 3.375 G IVPB
3.3750 g | Freq: Three times a day (TID) | INTRAVENOUS | Status: DC
  Administered 2019-01-03: 09:00:00 3.375 g via INTRAVENOUS
  Filled 2019-01-03: qty 50

## 2019-01-03 MED ORDER — CARBAMAZEPINE 200 MG PO TABS: 200.0000 mg | ORAL_TABLET | Freq: Two times a day (BID) | ORAL | Status: DC

## 2019-01-03 MED ORDER — LISINOPRIL 20 MG PO TABS: 20.0000 mg | ORAL_TABLET | Freq: Every day | ORAL | Status: DC

## 2019-01-03 MED ORDER — AMOXICILLIN-POT CLAVULANATE 875-125 MG PO TABS: 1.0000 | ORAL_TABLET | Freq: Two times a day (BID) | ORAL | Status: DC
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
## 2019-01-03 NOTE — Consult Note (Signed)
Cardiology Consultation:   Patient ID: [name] MRN: [id number]; DOB: 1961/03/13  Admit date: 01/02/2019 Date of Consult: 01/03/2019  Primary Care Provider: Patient, No Pcp Per Primary Cardiologist: Kristeen MissPhilip Nahser, MD    Patient Profile:   [name] is a 58 y.o. male with a hx of chronic combined systolic and diastolic CHF who is being seen today for the evaluation of acute CHF exacerbation at the request of Dr Herma Carsonipu Rai.  History of Present Illness:   [name] male with a hx of chronic systolic heart failure EF 25-30%, type 2 DM, HTN, schizophrenia, polysubstance abuse, histoty of HCV, OSA, obesity who is being seen today for the evaluation of acute on chronic combined systolic diastolic CHF  The patient is homeless, he was just discharged on July 3 for acute on chronic combined systolic diastolic CHF and was diuresed over 20 pounds.  On that admission he required BiPAP for O2 sats as low as 75% on room air. On that admission he was noted to have transient high-grade heart block, mostly Mobitz 2 second-degree AV block. He was seen by Dr. Ladona Ridgelaylor who noted that pt was  asymptomatic with this. The patient was treated with diuretic therapy and quite anxious about being discharged without a place to live. He noted that his scrotum and penis have been swollen for several weeks. The plan was for watchful waiting of his bradycardia and treatment for volume overload. He was felt to be a poor candidate for a pacemaker given his multiple comorbidities and current living situation. He ended up leaving AMA.  He came back to the ED a day later because of increased swelling in his scrotum.  He was hypoxic in the ED but that improved with IV Lasix. He currently does not need any oxygen and appears comfortable.  He denies any chest pain, shortness of breath, no orthopnea proximal nocturnal dyspnea.  Heart Pathway Score:     Past Medical History:  Diagnosis Date   Chronic foot  pain    Cocaine abuse (HCC)    Diabetes mellitus without complication (HCC)    Hepatitis C    unsure    Homelessness    Hypertension    Neuropathy    Polysubstance abuse (HCC)    Schizophrenia (HCC)    Sleep apnea    Systolic and diastolic CHF, chronic (HCC)     Past Surgical History:  Procedure Laterality Date   MULTIPLE TOOTH EXTRACTIONS      Inpatient Medications: Scheduled Meds:  ARIPiprazole  10 mg Oral Daily   carbamazepine  100 mg Oral BID   enoxaparin (LOVENOX) injection  40 mg Subcutaneous Q24H   furosemide  80 mg Intravenous BID   insulin aspart  0-15 Units Subcutaneous TID WC   insulin aspart  0-5 Units Subcutaneous QHS   lisinopril  10 mg Oral Daily   potassium chloride SA  20 mEq Oral BID   sodium chloride flush  3 mL Intravenous Q12H   Continuous Infusions:  sodium chloride     piperacillin-tazobactam (ZOSYN)  IV 3.375 g (01/03/19 0850)   PRN Meds: sodium chloride, acetaminophen, ALPRAZolam, ondansetron (ZOFRAN) IV, sodium chloride flush, zolpidem  Allergies:    Allergies  Allergen Reactions   Haldol [Haloperidol] Other (See Comments)    Muscle spasms, loss of voluntary movement. However, pt has taken Thorazine on multiple occasions with no adverse effects.     Social History:   Social History   Socioeconomic History   Marital status: Single  Spouse name: Not on file   Number of children: Not on file   Years of education: Not on file   Highest education level: Not on file  Occupational History   Not on file  Social Needs   Financial resource strain: Not on file   Food insecurity    Worry: Not on file    Inability: Not on file   Transportation needs    Medical: Not on file    Non-medical: Not on file  Tobacco Use   Smoking status: Current Every Day Smoker    Packs/day: 1.00    Years: 20.00    Pack years: 20.00    Types: Cigarettes   Smokeless tobacco: Current User  Substance and Sexual Activity    Alcohol use: Yes    Comment: Daily Drinker    Drug use: Yes    Frequency: 7.0 times per week    Types: "Crack" cocaine, Cocaine, Marijuana   Sexual activity: Not Currently  Lifestyle   Physical activity    Days per week: Not on file    Minutes per session: Not on file   Stress: Not on file  Relationships   Social connections    Talks on phone: Not on file    Gets together: Not on file    Attends religious service: Not on file    Active member of club or organization: Not on file    Attends meetings of clubs or organizations: Not on file    Relationship status: Not on file   Intimate partner violence    Fear of current or ex partner: Not on file    Emotionally abused: Not on file    Physically abused: Not on file    Forced sexual activity: Not on file  Other Topics Concern   Not on file  Social History Narrative   ** Merged History Encounter **        Family History:    Family History  Problem Relation Age of [place]   Hypertension Other    Diabetes Other      ROS:  Please see the history of present illness.  All other ROS reviewed and negative.     Physical Exam/Data:   Vitals:   01/02/19 1642 01/02/19 1816 01/02/19 2058 01/03/19 0546  BP:   (!) 150/89 (!) 160/95  Pulse:   92 96  Resp:   18 18  Temp:   98.1 F (36.7 C) 98.4 F (36.9 C)  TempSrc:   Oral Oral  SpO2: 94%  92% 94%  Weight:  105.1 kg  104.1 kg  Height:  5\' 9"  (1.753 m)      Intake/Output Summary (Last 24 hours) at 01/03/2019 0904 Last data filed at 01/03/2019 0816 Gross per 24 hour  Intake 480 ml  Output 2775 ml  Net -2295 ml   Last 3 Weights 01/03/2019 01/02/2019 01/02/2019  Weight (lbs) 229 lb 8 oz 231 lb 11.3 oz 228 lb 2.8 oz  Weight (kg) 104.1 kg 105.1 kg 103.5 kg  Some encounter information is confidential and restricted. Go to Review Flowsheets activity to see all data.     Body mass index is 33.89 kg/m.  General: Poor nutrition, HEENT: normal Neck:  JVD 6 cm  bilaterally Endocrine:  No thryomegaly Vascular: No carotid bruits; FA pulses 2+ bilaterally without bruits  Cardiac:  normal S1, S2; RRR; 3 out of 6 systolic murmur Lungs:  clear to auscultation bilaterally, no wheezing, rhonchi or rales  Abd: Increased abdominal  girth Ext: Mild edema bilaterally, poor peripheral pulses  EKG:  The EKG was personally reviewed and demonstrates:   Telemetry:  Telemetry was personally reviewed and demonstrates: Sinus rhythm, PVCs  Relevant CV Studies:  Echocardiogram 11/07/2018: Impressions: 1. The left ventricle has severely reduced systolic function, with an ejection fraction of 25-30%. The cavity size was mildly dilated. There is moderate concentric left ventricular hypertrophy. Left ventricular diastolic Doppler parameters are  consistent with restrictive filling. Elevated left atrial and left ventricular end-diastolic pressures There is right ventricular volume and pressure overload. Left ventricular diffuse hypokinesis. 2. The right ventricle has severely reduced systolic function. The cavity was moderately enlarged. There is no increase in right ventricular wall thickness. Right ventricular systolic pressure is moderately elevated with an estimated pressure of 51.2  mmHg. 3. Left atrial size was moderately dilated. 4. Right atrial size was severely dilated. 5. Small pericardial effusion. 6. The pericardial effusion is posterior to the left ventricle. 7. Mild thickening of the mitral valve leaflet. No evidence of mitral valve stenosis. 8. The tricuspid valve is grossly normal. Tricuspid valve regurgitation is moderate. 9. The aortic valve is tricuspid. Mild thickening of the aortic valve. Mild calcification of the aortic valve. Aortic valve regurgitation is mild by color flow Doppler. No stenosis of the aortic valve. 10. The inferior vena cava was dilated in size with <50% respiratory variability.  Summary: There is no significant difference  since the prior study on 09/01/2017, there is severe biventricular failure, LVEDP is severely elevated.  Laboratory Data:  High Sensitivity Troponin:   Recent Labs  Lab 12/31/18 1757 12/31/18 2345 01/01/19 0159 01/02/19 0758  TROPONINIHS 21* 21* 22.0* 22.0*     Cardiac EnzymesNo results for input(s): TROPONINI in the last 168 hours. No results for input(s): TROPIPOC in the last 168 hours.  Chemistry Recent Labs  Lab 01/01/19 0159 01/02/19 0758 01/03/19 0416  NA 138 139 138  K 3.7 3.6 3.6  CL 100 101 101  CO2 29 26 29   GLUCOSE 239* 191* 175*  BUN 21* 23* 27*  CREATININE 0.94 1.05 0.91  CALCIUM 8.6* 8.9 8.6*  GFRNONAA >60 >60 >60  GFRAA >60 >60 >60  ANIONGAP 9 12 8     Recent Labs  Lab 12/31/18 1757 01/01/19 0159 01/02/19 0758  PROT 7.8 7.3 7.7  ALBUMIN 3.3* 3.0* 3.2*  AST 25 23 27   ALT 14 13 14   ALKPHOS 197* 191* 186*  BILITOT 0.5 0.4 0.7   Hematology Recent Labs  Lab 01/01/19 0159 01/02/19 0758 01/02/19 1640  WBC 6.5 6.7 5.5  RBC 3.74* 3.93* 3.83*  HGB 8.5* 8.7* 8.6*  HCT 29.5* 31.3* 30.5*  MCV 78.9* 79.6* 79.6*  MCH 22.7* 22.1* 22.5*  MCHC 28.8* 27.8* 28.2*  RDW 20.9* 20.7* 20.6*  PLT 292 285 271   BNP Recent Labs  Lab 12/31/18 1758 01/02/19 0759  BNP 891.8* 764.2*    DDimer No results for input(s): DDIMER in the last 168 hours.   Radiology/Studies:  Ct Pelvis W Contrast  Result Date: 01/01/2019 CLINICAL DATA:  58 y/o M; swelling of genitals for 8 months. Cellulitis of corpus cavernosum and penis. EXAM: CT PELVIS WITH CONTRAST TECHNIQUE: Multidetector CT imaging of the pelvis was performed using the standard protocol following the bolus administration of intravenous contrast. CONTRAST:  100mL OMNIPAQUE IOHEXOL 300 MG/ML  SOLN COMPARISON:  None. FINDINGS: Urinary Tract:  Mild bladder wall thickening. Bowel:  Included bowel is unremarkable. Vascular/Lymphatic: Calcific atherosclerosis of visible iliofemoral arteries which are patent. Reproductive:  Prostate enlargement measuring 6.1 x 6.3 x 6.6 cm (volume = 130 cm^3). Diffuse edema of the scrotal wall and superficial soft tissues of the penis. No discrete rim enhancing collection. Other:  Small to moderate volume of peritoneal ascites and anasarca. Musculoskeletal: No acute fracture. IMPRESSION: 1. Diffuse edema of the scrotal wall and superficial soft tissues of penis. No discrete rim enhancing collection. Findings may represent cellulitis or be secondary to volume overload. 2. Small to moderate volume of peritoneal ascites and anasarca. 3. Prostate enlargement. 4. Mild bladder wall thickening, possibly chronic outflow obstruction or cystitis. Electronically Signed   By: Kristine Garbe M.D.   On: 01/01/2019 01:26   Dg Chest Port 1 View  Result Date: 01/02/2019 CLINICAL DATA:  Cough, shortness of breath, leg swelling. EXAM: PORTABLE CHEST 1 VIEW COMPARISON:  Chest x-ray dated December 31, 2018. FINDINGS: Stable cardiomegaly. Unchanged mild pulmonary vascular congestion. Unchanged trace bilateral pleural effusions and mild bibasilar atelectasis. No consolidation or pneumothorax. No acute osseous abnormality. IMPRESSION: 1. Unchanged mild pulmonary vascular congestion and trace bilateral pleural effusions. Electronically Signed   By: Titus Dubin M.D.   On: 01/02/2019 08:23   Dg Chest Port 1 View  Result Date: 12/31/2018 CLINICAL DATA:  Shortness of breath. EXAM: PORTABLE CHEST 1 VIEW COMPARISON:  Radiograph of December 25, 2018. FINDINGS: Stable cardiomegaly with central pulmonary vascular congestion is noted. No pneumothorax is noted. Mild bibasilar subsegmental atelectasis is noted with minimal pleural effusions. Bony thorax is unremarkable. IMPRESSION: Stable cardiomegaly with central pulmonary vascular congestion. Mild bibasilar subsegmental atelectasis is noted with minimal pleural effusions. Electronically Signed   By: Marijo Conception M.D.   On: 12/31/2018 18:23    Assessment and Plan:   58  y.o. male with a hx of chronic systolic heart failure EF 25-30%, type 2 DM, HTN, schizophrenia, polysubstance abuse, histoty of HCV, OSA, morbid obesity and being homelesswho is being seen for the evaluation of bradycardia and heart failure.  Acute on chronic combined systolic and diastolic systolic heart failure, RV failure with peripheral edema -Diuresing with lasix 80 mg IV BID with good urine output, I would continue till tomorrow, he is now down to 229 pounds, still signs of fluid overload, overall improved since last admission when he was over 250 pounds.  Creatinine is stable.  Potassium 3.6  Acute respiratory failure -Resolved with IV diuresis, now on room air  AV block, asymptomatic -pt with intermittent high grade AV block.   Resolved -Avoid AV nodal blocking agent.  -Difficult situation with pt being homeless and non-compliant. On last admission seen by Dr. Lovena Le and felt not to be good candidate for pacemaker.  Hypertension -On ACE-I -BP is elevated 150's-160's-90's-103.  - I would increase lisinopril to 20 mg daily  Social issues -Pt reports homeless and uses crack as much as he can, he says to cope with homelessness.  -SW has tried to help him many times in the past and efforts have not worked out. He does not qualify for SNF, which is what he wants. Difficult situation with no good options.   He wants his medications to be sent to [facility].  For questions or updates, please contact [place] Please consult [email or web address] for contact info under   Signed, Ena Dawley, MD  01/03/2019 9:04 AM
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
## 2019-01-03 NOTE — Consult Note (Signed)
Telepsych Consultation   Reason for Consult:  ''agitation, mania, cocaine.'' Referring Physician:  Dr. Isidoro Donningai Ripudeep Location of Patient: [facility]  [facility] of Provider: [facility]  Patient Identification: [name] MRN:  [id number] Principal Diagnosis: Substance induced mood disorder (HCC) Diagnosis:  Principal Problem:   Substance induced mood disorder (HCC) Active Problems:   Cocaine use disorder, severe, dependence (HCC)   Chronic paranoid schizophrenia (HCC)   Schizophrenia, paranoid type (HCC)   Insulin-requiring or dependent type II diabetes mellitus (HCC)   Essential hypertension   Substance or medication-induced bipolar and related disorder with onset during intoxication (HCC)   Schizoaffective disorder, bipolar type (HCC)   Chronic foot pain   Homelessness   Acute on chronic combined systolic and diastolic CHF (congestive heart failure) (HCC)   Scrotal swelling   Adjustment disorder with mixed disturbance of emotions and conduct   HLD (hyperlipidemia)   Anxiety   CHF (congestive heart failure), NYHA class II, acute on chronic, combined (HCC)   Diabetes mellitus without complication (HCC)   Acute on chronic combined systolic (congestive) and diastolic (congestive) heart failure (HCC)   Total Time spent with patient: 45 minutes  Subjective:   [name] is a 58 y.o. male patient admitted with leg, abdomina and scrotal swelling.  HPI:  Patient with long history of Schizoaffective disorder-bipolar, cocaine use disorder-severe, homelessness, uncontrolled diabetes mellitus type 2 who was admitted due to leg, abdominal, scrotal swelling and shortness of breath. Patient is calm and cooperative today but he reports being agitated yesterday because he was upset with his cousin who is is disability payee, he had asked her for money but she refused to give him the money, thinking that patient might spend it on cocaine. Currently, patient denies  depression, psychosis, delusions, SI/HI. He denies alcohol abuse but says he uses cocaine regularly.  Past Psychiatric History: as above   Risk to Self:   denies Risk to Others:  denies Prior Inpatient Therapy:  [facility] [place] [facility] Prior Outpatient Therapy:  [facility] [place]  Past Medical History:  Past Medical History:  Diagnosis Date  . Chronic foot pain   . Cocaine abuse (HCC)   . Diabetes mellitus without complication (HCC)   . Hepatitis C    unsure   . Homelessness   . Hypertension   . Neuropathy   . Polysubstance abuse (HCC)   . Schizophrenia (HCC)   . Sleep apnea   . Systolic and diastolic CHF, chronic (HCC)     Past Surgical History:  Procedure Laterality Date  . MULTIPLE TOOTH EXTRACTIONS     Family History:  Family History  Problem Relation Age of Onset  . Hypertension Other   . Diabetes Other    Family Psychiatric  History:  Social History:  Social History   Substance and Sexual Activity  Alcohol Use Yes   Comment: Daily Drinker      Social History   Substance and Sexual Activity  Drug Use Yes  . Frequency: 7.0 times per week  . Types: "Crack" cocaine, Cocaine, Marijuana    Social History   Socioeconomic History  . Marital status: Single    Spouse name: Not on file  . Number of children: Not on file  . Years of education: Not on file  . Highest education level: Not on file  Occupational History  . Not on file  Social Needs  . Financial resource strain: Not on file  . Food insecurity    Worry: Not on file  Inability: Not on file  . Transportation needs    Medical: Not on file    Non-medical: Not on file  Tobacco Use  . Smoking status: Current Every Day Smoker    Packs/day: 1.00    Years: 20.00    Pack years: 20.00    Types: Cigarettes  . Smokeless tobacco: Current User  Substance and Sexual Activity  . Alcohol use: Yes    Comment: Daily Drinker   . Drug use: Yes    Frequency: 7.0 times per week    Types: "Crack" cocaine,  Cocaine, Marijuana  . Sexual activity: Not Currently  Lifestyle  . Physical activity    Days per week: Not on file    Minutes per session: Not on file  . Stress: Not on file  Relationships  . Social Musician on phone: Not on file    Gets together: Not on file    Attends religious service: Not on file    Active member of club or organization: Not on file    Attends meetings of clubs or organizations: Not on file    Relationship status: Not on file  Other Topics Concern  . Not on file  Social History Narrative   ** Merged History Encounter **       Additional Social History:    Allergies:   Allergies  Allergen Reactions  . Haldol [Haloperidol] Other (See Comments)    Muscle spasms, loss of voluntary movement. However, pt has taken Thorazine on multiple occasions with no adverse effects.     Labs:  Results for orders placed or performed during the hospital encounter of 01/02/19 (from the past 48 hour(s))  C-reactive protein     Status: Abnormal   Collection Time: 01/02/19  7:58 AM  Result Value Ref Range   CRP 8.0 (H) <1.0 mg/dL    Comment: Performed at [facility], [address], [place], [place] [address]  Prealbumin     Status: Abnormal   Collection Time: 01/02/19  7:58 AM  Result Value Ref Range   Prealbumin 14.6 (L) 18 - 38 mg/dL    Comment: Performed at [facility], [address] [place], [place] [address]  Sedimentation rate     Status: Abnormal   Collection Time: 01/02/19  4:40 PM  Result Value Ref Range   Sed Rate 45 (H) 0 - 16 mm/hr    Comment: Performed at [facility], [address] [place], [place] [address]  CBC WITH DIFFERENTIAL     Status: Abnormal   Collection Time: 01/02/19  4:40 PM  Result Value Ref Range   WBC 5.5 4.0 - 10.5 K/uL   RBC 3.83 (L) 4.22 - 5.81 MIL/uL   Hemoglobin 8.6 (L) 13.0 - 17.0 g/dL   HCT 21.3 (L) 08.6 - 57.8 %   MCV 79.6 (L) 80.0 - 100.0 fL   MCH 22.5  (L) 26.0 - 34.0 pg   MCHC 28.2 (L) 30.0 - 36.0 g/dL   RDW 46.9 (H) 62.9 - 52.8 %   Platelets 271 150 - 400 K/uL   nRBC 0.0 0.0 - 0.2 %   Neutrophils Relative % 68 %   Neutro Abs 3.7 1.7 - 7.7 K/uL   Lymphocytes Relative 17 %   Lymphs Abs 0.9 0.7 - 4.0 K/uL   Monocytes Relative 10 %   Monocytes Absolute 0.6 0.1 - 1.0 K/uL   Eosinophils Relative 4 %   Eosinophils Absolute 0.2 0.0 -  0.5 K/uL   Basophils Relative 1 %   Basophils Absolute 0.1 0.0 - 0.1 K/uL   Immature Granulocytes 0 %   Abs Immature Granulocytes 0.02 0.00 - 0.07 K/uL    Comment: Performed at [facility], [address] [place], [place] [address]  Glucose, capillary     Status: Abnormal   Collection Time: 01/02/19  4:53 PM  Result Value Ref Range   Glucose-Capillary 179 (H) 70 - 99 mg/dL  TSH     Status: None   Collection Time: 01/02/19  4:54 PM  Result Value Ref Range   TSH 2.178 0.350 - 4.500 uIU/mL    Comment: Performed by a 3rd Generation assay with a functional sensitivity of <=0.01 uIU/mL. Performed at [facility], [address] [place], [place] [address]   Glucose, capillary     Status: Abnormal   Collection Time: 01/02/19  8:59 PM  Result Value Ref Range   Glucose-Capillary 205 (H) 70 - 99 mg/dL  Basic metabolic panel     Status: Abnormal   Collection Time: 01/03/19  4:16 AM  Result Value Ref Range   Sodium 138 135 - 145 mmol/L   Potassium 3.6 3.5 - 5.1 mmol/L   Chloride 101 98 - 111 mmol/L   CO2 29 22 - 32 mmol/L   Glucose, Bld 175 (H) 70 - 99 mg/dL   BUN 27 (H) 6 - 20 mg/dL   Creatinine, Ser 8.290.91 0.61 - 1.24 mg/dL   Calcium 8.6 (L) 8.9 - 10.3 mg/dL   GFR calc non Af Amer >60 >60 mL/min   GFR calc Af Amer >60 >60 mL/min   Anion gap 8 5 - 15    Comment: Performed at [facility], [address] [place], [place] [address]  Glucose, capillary     Status: Abnormal   Collection Time: 01/03/19  7:24 AM  Result Value Ref Range    Glucose-Capillary 156 (H) 70 - 99 mg/dL  Glucose, capillary     Status: Abnormal   Collection Time: 01/03/19 12:16 PM  Result Value Ref Range   Glucose-Capillary 182 (H) 70 - 99 mg/dL    Medications:  Current Facility-Administered Medications  Medication Dose Route Frequency Provider Last Rate Last Dose  . 0.9 %  sodium chloride infusion  250 mL Intravenous PRN Marguerita MerlesSheikh, Omair Latif, DO      . acetaminophen (TYLENOL) tablet 650 mg  650 mg Oral Q4H PRN Marguerita MerlesSheikh, Omair [place], DO   650 mg at 01/03/19 [id number]  . ALPRAZolam Prudy Feeler(XANAX) tablet 0.25 mg  0.25 mg Oral BID PRN Marguerita MerlesSheikh, Omair Latif, DO   0.25 mg at 01/03/19 0950  . ARIPiprazole (ABILIFY) tablet 10 mg  10 mg Oral Daily Marguerita MerlesSheikh, Omair [place]if, DO   10 mg at 01/03/19 [id number]  . carbamazepine (TEGRETOL) tablet 200 mg  200 mg Oral BID Demondre Aguas, MD      . enoxaparin (LOVENOX) injection 40 mg  40 mg Subcutaneous Q24H Sheikh, Omair [place], DO   40 mg at 01/02/19 2111  . furosemide (LASIX) injection 80 mg  80 mg Intravenous BID Marguerita MerlesSheikh, Omair [place], DO   80 mg at 01/03/19 [id number]  . insulin aspart (novoLOG) injection 0-15 Units  0-15 Units Subcutaneous TID WC Marguerita MerlesSheikh, Omair [place], DO   3 Units at 01/03/19 [phone] [id number]  . insulin aspart (novoLOG) injection 0-5 Units  0-5 Units Subcutaneous QHS Marguerita MerlesSheikh, Omair [place], [place]   2 Units at 01/02/19 2112  . [START ON 01/04/2019] lisinopril (  ZESTRIL) tablet 20 mg  20 mg Oral Daily Dorothy Spark, MD      . ondansetron [facility]) injection 4 mg  4 mg Intravenous Q6H PRN Sheikh, Omair Latif, DO      . piperacillin-tazobactam (ZOSYN) IVPB 3.375 g  3.375 g Intravenous Q8H Leodis Sias T, RPH 12.5 mL/hr at 01/03/19 0850 3.375 g at 01/03/19 0850  . potassium chloride SA (K-DUR) CR tablet 20 mEq  20 mEq Oral BID Raiford Noble [place], DO   20 mEq at 01/03/19 0947  . sodium chloride flush (NS) 0.9 % injection 3 mL  3 mL Intravenous Q12H Sheikh, Georgina Quint [place], DO   3 mL at 01/02/19 2116  . sodium chloride flush (NS) 0.9 % injection 3 mL  3 mL  Intravenous PRN Sheikh, Omair Latif, DO      . zolpidem (AMBIEN) tablet 5 mg  5 mg Oral QHS PRN,MR X 1 Sheikh, Omair [place], DO   5 mg at 01/02/19 2112    Musculoskeletal: Strength & Muscle Tone: not tested Gait & Station: not tested Patient leans: N/A  Psychiatric Specialty Exam: Physical Exam  Psychiatric: He has a normal mood and affect. His behavior is normal. Judgment and thought content normal. His speech is rapid and/or pressured. Cognition and memory are normal.    Review of Systems  Constitutional: Negative.   HENT: Negative.   Eyes: Negative.   Respiratory: Positive for shortness of breath.   Cardiovascular: Positive for palpitations and leg swelling.  Gastrointestinal: Positive for abdominal pain.  Genitourinary: Negative.   Skin: Negative.   Neurological: Negative.   Endo/Heme/Allergies: Negative.   Psychiatric/Behavioral: Negative.     Blood pressure (!) 160/95, pulse 96, temperature 98.4 F (36.9 C), temperature source Oral, resp. rate 18, height 5\' 9"  (1.753 m), weight 104.1 kg, SpO2 94 %.Body mass index is 33.89 kg/m.  General Appearance: Casual and Fairly Groomed  Eye Contact:  Good  Speech:  Clear and Coherent and Pressured  Volume:  Normal  Mood:  Euthymic  Affect:  Appropriate  Thought Process:  Coherent and Linear  Orientation:  Full (Time, Place, and Person)  Thought Content:  Logical  Suicidal Thoughts:  No  Homicidal Thoughts:  No  Memory:  Immediate;   Fair Recent;   Fair Remote;   Fair  Judgement:  Fair  Insight:  Shallow  Psychomotor Activity:  Normal  Concentration:  Concentration: Fair and Attention Span: Fair  Recall:  Good  Fund of Knowledge:  Good  Language:  Good  Akathisia:  No  Handed:  Right  AIMS (if indicated):     Assets:  Communication Skills Desire for Improvement  ADL's:  Intact  Cognition:  WNL  Sleep:   fair     Treatment Plan Summary: 58 y.o. year-old male with a history of Cocaine use disorder, Schizoaffective  disorder-bipolar, diabetes, hypertension, CHF who was admitted due to shortness of breath, leg and scrotal swelling. He reportedly became agitated and manic yesterday but claimed he was upset with his sister. Today, he is calm, cooperative, denies psychosis, delusions, SI/HI and agreed to take his medications(Abilify and Carbamazepine). Patient is cleared by psychiatric service, no evidence of danger to himself or others at the moment.  Recommendations: -Continue Abilify 10 mg daily for Schizoaffective disorder -Increase Carbamazepine to 200 mg bid for agitated mood -Refer patient to [facility] upon discharge for outpatient psychiatric medication management.  Disposition: No evidence of imminent risk to self or others at present.   Patient does not meet  criteria for psychiatric inpatient admission. Supportive therapy provided about ongoing stressors. Psychiatric service is siging out. Re-consult as needed  This service was provided via telemedicine using a 2-way, interactive audio and video technology.  Names of all persons participating in this telemedicine service and their role in this encounter. Name: Thedore MinsMojeed Jhamari Markowicz, MD Role: Psychiatrist  Name: Hart RobinsonsFrederick [name] Role: Patient  Name: Chales AbrahamsPam Ligka, Role: RN    Thedore MinsMojeed Darcella Shiffman, MD 01/03/2019 12:33 PM

## 2019-01-03 NOTE — Progress Notes (Signed)
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  GLUCAP 254* 248* 179* 205* 156*   Lipid Profile: No results for input(s): CHOL, HDL, LDLCALC, TRIG, CHOLHDL, LDLDIRECT in the last 72 hours. Thyroid Function Tests: Recent Labs    01/02/19 1654  TSH 2.178   Anemia Panel: Recent Labs    12/31/18 2345  VITAMINB12 297  FOLATE 16.5  FERRITIN 20*  TIBC 500*  IRON 23*  RETICCTPCT 1.8   Urine analysis:    Component Value Date/Time   COLORURINE YELLOW 01/02/2019 0724   APPEARANCEUR CLEAR 01/02/2019 0724   LABSPEC 1.026 01/02/2019 0724   PHURINE 5.0 01/02/2019 0724   GLUCOSEU NEGATIVE 01/02/2019 0724   HGBUR NEGATIVE 01/02/2019 0724   BILIRUBINUR NEGATIVE 01/02/2019 0724   KETONESUR NEGATIVE 01/02/2019 0724   PROTEINUR 100 (A) 01/02/2019 0724   UROBILINOGEN 1.0 03/14/2015 0610   NITRITE NEGATIVE  01/02/2019 0724   LEUKOCYTESUR NEGATIVE 01/02/2019 0724  Estill Cotta M.D. [facility] 01/03/2019, 10:03 AM  Pager: [phone] Between 7am to 7pm - call Pager - [phone]  After 7pm go to [email or web address] - password [email or web address]  Call night coverage person covering after 7pm

## 2019-01-03 NOTE — TOC Initial Note (Signed)
Transition of Care [facility]) - Initial/Assessment Note    Patient Details  Name: [name] MRN: [id number] Date of Birth: 21-Oct-1960  Transition of Care [facility]) CM/SW Contact:    Greg Cutter, LCSW Phone Number: 01/03/2019, 12:44 PM  Clinical Narrative:   LCSW contacted patient's room and spoke with patient on 01/03/2019. Patient reports being agreeable to SNF placement at this time. Patient provides permission for LCSW to complete FL2 and fax out to facilities.    Expected Discharge Plan: Skilled Nursing Facility Barriers to Discharge: Insurance Authorization   Patient Goals and CMS Choice Patient states their goals for this hospitalization and ongoing recovery are:: I want to get stronger CMS Medicare.gov Compare Post Acute Care list provided to:: Patient Choice offered to / list presented to : Patient  Expected Discharge Plan and Services Expected Discharge Plan: [employer] In-house Referral: Clinical Social Work     Prior Living Arrangements/Services     Patient language and need for interpreter reviewed:: No Do you feel safe going back to the place where you live?: No   Needs more therapy in order to take proper care of self           Activities of Daily Living Home Assistive Devices/Equipment: Gilford Rile (specify type) ADL Screening (condition at time of admission) Patient's cognitive ability adequate to safely complete daily activities?: Yes Is the patient deaf or have difficulty hearing?: No Does the patient have difficulty seeing, even when wearing glasses/contacts?: No Does the patient have difficulty concentrating, remembering, or making decisions?: No Patient able to express need for assistance with ADLs?: Yes Does the patient have difficulty dressing or bathing?: No Independently performs ADLs?: Yes (appropriate for developmental age) Communication: Independent Dressing (OT): Needs assistance Is this a change from baseline?: Pre-admission  baseline Grooming: Needs assistance Is this a change from baseline?: Pre-admission baseline Feeding: Independent Is this a change from baseline?: Pre-admission baseline Bathing: Needs assistance Is this a change from baseline?: Pre-admission baseline Toileting: Needs assistance Is this a change from baseline?: Pre-admission baseline In/Out Bed: Independent Is this a change from baseline?: Pre-admission baseline Walks in Home: Needs assistance Is this a change from baseline?: Pre-admission baseline Does the patient have difficulty walking or climbing stairs?: Yes Weakness of Legs: Both Weakness of Arms/Hands: Both  Permission Sought/Granted Permission sought to share information with : Case Manager, Customer service manager, PCP Permission granted to share information with : Yes, Verbal Permission Granted     Emotional Assessment Appearance:: Appears stated age Attitude/Demeanor/Rapport: Gracious Affect (typically observed): Accepting Orientation: : Oriented to Self, Oriented to Place, Oriented to  Time, Oriented to Situation Alcohol / Substance Use: Alcohol Use, Tobacco Use Psych Involvement: Yes (comment) inpatient psych referred.  Admission diagnosis:  Fluid in lungs, High BP, diabetes, CHF Patient Active Problem List   Diagnosis Date Noted  . Acute on chronic systolic CHF (congestive heart failure) ([place]) 01/02/2019  . Acute on chronic combined systolic (congestive) and diastolic (congestive) heart failure ([place]) 01/02/2019  . Acute on chronic systolic heart failure ([place]) 12/26/2018  . Mobitz type II atrioventricular block 12/25/2018  . Acute exacerbation of CHF (congestive heart failure) ([place]) 12/22/2018  . Acute on chronic respiratory failure with hypoxia ([place]) 12/22/2018  . CHF exacerbation ([place]) 12/19/2018  . Acute CHF (congestive heart failure) ([place]) 12/16/2018  . Diabetes mellitus without complication ([place]) 95/03/3266  . Acute on chronic systolic (congestive)  heart failure ([place]) 12/11/2018  . Abdominal pain 12/11/2018  . Nausea vomiting and diarrhea 12/11/2018  .  Anemia 12/11/2018  . Evaluation by psychiatric service required   . MDD (major depressive disorder), severe (HCC) 11/25/2018  . Pressure injury of skin 11/08/2018  . Elevated troponin 10/18/2018  . HLD (hyperlipidemia) 10/18/2018  . Anxiety 10/18/2018  . CHF (congestive heart failure), NYHA class II, acute on chronic, combined (HCC) 10/18/2018  . Hypertensive urgency 10/12/2018  . Mild renal insufficiency 10/12/2018  . Cellulitis 10/12/2018  . Penile cellulitis   . Adjustment disorder with mixed disturbance of emotions and conduct   . Sleep apnea 10/03/2018  . Hypokalemia 09/17/2018  . Scrotal swelling 09/17/2018  . Urinary hesitancy   . Constipation   . Acute on chronic combined systolic and diastolic CHF (congestive heart failure) (HCC) 08/25/2018  . Tobacco use 08/18/2018  . Homelessness 08/08/2018  . Smoker 08/08/2018  . Prostate enlargement 03/16/2018  . Aortic atherosclerosis (HCC) 03/16/2018  . 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 Chronic foot pain   . Schizoaffective disorder, bipolar type (HCC) 09/30/2016  . Substance induced mood disorder (HCC) 03/13/2015  . Schizophrenia, paranoid type (HCC) 01/17/2015  . Drug hallucinosis (HCC) 10/08/2014  . Chronic paranoid schizophrenia (HCC) 09/07/2014  . 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 Urinary retention   . Cocaine use disorder, severe, dependence (HCC)   . Essential hypertension 03/28/2013  . Insulin-requiring or dependent type II diabetes mellitus (HCC) 03/15/2013   PCP:  Patient, No Pcp Per Pharmacy:   [id number] [place] [address]  [place] [address] Phone: [phone] Fax: [phone]  [facility] [place] - [address] [place] [address] Phone: [phone] Fax: [phone]  [facility] [phone]#[id number] - [place] - [address] [facility] [address] [place] [address] Phone: [phone] Fax: [phone]  Readmission Risk Interventions Readmission Risk Prevention Plan 12/28/2018 12/18/2018 10/05/2018  Transportation Screening Complete Complete Complete  Medication Review (RN Care Manager) - Complete Complete  PCP or Specialist appointment within 3-5 days of discharge - Complete Complete  HRI or Home Care Consult - Complete Patient refused  SW Recovery Care/Counseling Consult Complete Complete Complete  Palliative Care Screening Not Applicable Not Applicable Not Applicable  Skilled Nursing Facility - Not Applicable Not Applicable  Some recent data might be hidden   Dickie LaBrooke Edan Serratore, BSW, MSW, LCSW [address]CSW [email or web address] Phone: [phone]

## 2019-01-03 NOTE — Discharge Summary (Signed)
AMA NOTE    Patient ID: Taylor MemosFrederick L Poyer MRN: [id number] DOB/AGE: 1961/03/22 58 y.o.  Admit date: 01/02/2019 Discharge date: 01/03/2019   PLEASE NOTE THAT PATIENT LEFT AGAINST MEDICAL ADVICE. Risks of worsening symptoms, untreated CHF, respiratory failure, death were explained in detail to the patient and he verbally understood the risks of leaving AMA.   Primary Care Physician:  Patient, No Pcp Per  Discharge Diagnoses:      Acute respiratory failure with hypoxia . Acute on chronic combined systolic (congestive) and diastolic (congestive) heart failure (HCC)  . Severe medical noncompliance with recurring admissions and leaving AMA within 24 hours  . Substance or medication-induced bipolar and related disorder with onset during intoxication (HCC) . Substance induced mood disorder (HCC) . Scrotal swelling . Schizophrenia, paranoid type (HCC) . Schizoaffective disorder, bipolar type (HCC) . HLD (hyperlipidemia) . Cocaine use disorder, severe, dependence (HCC) . Chronic paranoid schizophrenia (HCC) . Chronic foot pain    Consults:   Cardiology, Dr. Delton SeeNelson Psychiatry   Recommendations for Outpatient Follow-up: Patient left AMA   TESTS THAT NEED FOLLOW-UP Patient left AMA   DIET: Patient left AMA    Allergies:   Allergies  Allergen Reactions  . Haldol [Haloperidol] Other (See Comments)    Muscle spasms, loss of voluntary movement. However, pt has taken Thorazine on multiple occasions with no adverse effects.      Discharge Medications: Please note that patient left AMA (against medical advice)   Brief H and P: For complete details please refer to admission H and P, but in briefPatient is a 58 year old male with history of homelessness, cocaine abuse, uncontrolled diabetes mellitus type 2, severe medical noncompliance, recurrent admissions as patient leaves AMA, chronic combined systolic and diastolic CHF, not taking any medications, history of schizophrenia  who had just left AMA a day before the admission.  Subsequently he left AMA again from ED, returned back to get admitted.  Patient reported that he was having increased swelling in his legs up to his scrotum, abdominal distention and worsening shortness of breath.  He was given the prescription of Lasix from the previous admission however he did not fill it.  Patient was found to be hypoxic in ED, was given a dose of Lasix with improvement in his shortness of breath. Patient was admitted for acute on chronic combined systolic and diastolic CHF, scrotal ulcer.  COVID-19 test negative   Hospital Course:   Acute respiratory failure with hypoxia secondary to acute on chronic combined systolic and diastolic CHF exacerbation -Initially was hypoxic in ED with O2 sats in 80s, improved after receiving IV Lasix dose -Currently sats improving, O2 sats 94% on room air -Chest x-ray on admission showed trace bilateral pleural effusions with pulmonary vascular congestion -Patient was placed on aggressive IV diuresis which improved hypoxia    Acute on chronic combined systolic and diastolic CHF, anasarca, abdominal distention -Difficult situation with being homeless and severely noncompliant, does not stay in the hospital long enough to be treated properly, polysubstance abuse, cocaine abuse -Patient was placed on IV Lasix 80 mg twice daily, diuresing well, negative balance of 4 L -Weight down from 231-> 229 lbs today, improved since last admission when he was over 250lbs -Abdominal ultrasound showed mild to moderate ascites -Cardiology was consulted and recommended IV diuresis, difficult situation with patient's severe noncompliance. -Around noon, patient became agitated and demanded to leave AMA.  He was explained the risks of worsening respiratory failure due to untreated CHF.  He was explained that  he was still fluid overloaded and needed IV diuresis before he can be safely discharged.  Psychiatry, PT,  OT, social work were consulted for safe discharge however patient did not comply and signed out AMA again  He still has the prescription with him for oral Lasix from previous admission, left AMA on 01/01/2019   Symptomatic AV block -Patient with intermittent high-grade AV block, resolved, avoid AV nodal blocking agents -Patient was seen by Dr. Ladona Ridgelaylor on previous admission and felt not to be a good candidate for pacemaker.  Difficult situation with patient being homeless and noncompliant  Peripheral edema up to scrotum, penile ulcer -Wound care consulted, will place on IV Zosyn for now, Augmentin at the time of discharge. Patient was given the prescription for Augmentin during last admission, did not fill it (states his cousin did not give him money).   He still has a prescription for Augmentin. -Recent CT scan on 7/3 had shown diffuse edema of the scrotal wall but no discrete rim-enhancing collection.  Findings may represent cellulitis or volume overload.  Essential hypertension -No beta-blocker due to cocaine abuse -Increase lisinopril to 20 mg daily  History of cocaine abuse -Recommended for cocaine cessation, counseled  Microcytic anemia -Hemoglobin 8.6, outpatient work-up if patient complies -Currently denies any overt bleeding -Transfuse for hemoglobin less than 7.0 or actively bleeding  History of schizophrenia -Psychiatry was consulted, seen by Dr. Jannifer FranklinAkintayo, recommended supportive therapy, no evidence of eminent risk to self or others at present, does not meet criteria for psych inpatient admissions.  He was cleared by psychiatry.  History of hepatitis C LFTs currently normal, outpatient treatment of hepatitis C Abdominal ultrasound showed moderate ascites  Left foot discoloration Follow ABIs, needs extensive management of his diabetes and other cardiac issues, patient severely noncompliant  Obesity BMI 34.2, counseled diet and weight control   Diabetes mellitus  type 2 Patient was placed on sliding scale insulin while inpatient. He has a prescription for glipizide from previous admission when he left AMA on 01/01/2019, did not fill it.  PT OT evaluation was done, recommended skilled nursing facility to the patient.  Social work was consulted, however patient left AMA  Day of Discharge/left AMA BP (!) 160/95 (BP Location: Right Arm)   Pulse 96   Temp 98.4 F (36.9 C) (Oral)   Resp 18   Ht 5\' 9"  (1.753 m)   Wt 104.1 kg   SpO2 94%   BMI 33.89 kg/m     The results of significant diagnostics from this hospitalization (including imaging, microbiology, ancillary and laboratory) are listed below for reference.    LAB RESULTS: Basic Metabolic Panel: Recent Labs  Lab 01/01/19 0159 01/02/19 0758 01/03/19 0416  NA 138 139 138  K 3.7 3.6 3.6  CL 100 101 101  CO2 29 26 29   GLUCOSE 239* 191* 175*  BUN 21* 23* 27*  CREATININE 0.94 1.05 0.91  CALCIUM 8.6* 8.9 8.6*  MG 2.0  --   --    Liver Function Tests: Recent Labs  Lab 01/01/19 0159 01/02/19 0758  AST 23 27  ALT 13 14  ALKPHOS 191* 186*  BILITOT 0.4 0.7  PROT 7.3 7.7  ALBUMIN 3.0* 3.2*   No results for input(s): LIPASE, AMYLASE in the last 168 hours. No results for input(s): AMMONIA in the last 168 hours. CBC: Recent Labs  Lab 01/02/19 0758 01/02/19 1640  WBC 6.7 5.5  NEUTROABS  --  3.7  HGB 8.7* 8.6*  HCT 31.3* 30.5*  MCV  79.6* 79.6*  PLT 285 271   Cardiac Enzymes: No results for input(s): CKTOTAL, CKMB, CKMBINDEX, TROPONINI in the last 168 hours. BNP: Invalid input(s): POCBNP CBG: Recent Labs  Lab 01/03/19 0724 01/03/19 1216  GLUCAP 156* 182*    Significant Diagnostic Studies:  Dg Chest Port 1 View  Result Date: 01/02/2019 CLINICAL DATA:  Cough, shortness of breath, leg swelling. EXAM: PORTABLE CHEST 1 VIEW COMPARISON:  Chest x-ray dated December 31, 2018. FINDINGS: Stable cardiomegaly. Unchanged mild pulmonary vascular congestion. Unchanged trace bilateral pleural  effusions and mild bibasilar atelectasis. No consolidation or pneumothorax. No acute osseous abnormality. IMPRESSION: 1. Unchanged mild pulmonary vascular congestion and trace bilateral pleural effusions. Electronically Signed   By: Titus Dubin M.D.   On: 01/02/2019 08:23   [place] Ascites (abdomen Limited)  Result Date: 01/03/2019 CLINICAL DATA:  Abdominal distension. EXAM: LIMITED ABDOMEN ULTRASOUND FOR ASCITES TECHNIQUE: Limited ultrasound survey for ascites was performed in all four abdominal quadrants. COMPARISON:  None. FINDINGS: Mild amount of fluid is noted in the left lower quadrant and right upper quadrant. Mild-to-moderate fluid is noted in the right lower quadrant. Probable mild fluid is noted in left upper quadrant. IMPRESSION: Mild to moderate ascites is noted. Electronically Signed   By: Marijo Conception M.D.   On: 01/03/2019 09:39    2D ECHO:   Disposition and Follow-up:    DISPOSITION: Patient left AMA. He was advised to seek follow-up with primary care physician.     DISCHARGE FOLLOW-UP    Time spent on Discharge: 25 minutes  Signed:   Estill Cotta M.D. [facility] 01/03/2019, 2:09 PM Pager: [phone]

## 2019-01-03 NOTE — [place] FL2 (Signed)
[name] FL2 LEVEL OF CARE SCREENING TOOL     IDENTIFICATION  Patient Name: [name] Birthdate: 12-04-1960 Sex: male Admission Date (Current Location): 01/02/2019  [place]:  [place] [id number] R Facility and Address:  [facility],  [address], [place] [id number]      Provider Number: [id number]  Attending Physician Name and Address:  [name], Taylor K, MD  Relative Name and Phone Number:  Taylor SenegalDionne [name] [phone]    Current Level of Care: Hospital Recommended Level of Care: Skilled Nursing Facility Prior Approval Number:    Date Approved/Denied:   PASRR Number: [id number]  Discharge Plan: Home    Current Diagnoses: Patient Active Problem List   Diagnosis Date Noted  . Acute on chronic systolic CHF (congestive heart failure) (HCC) 01/02/2019  . 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 Acute on chronic systolic heart failure (HCC) 12/26/2018  . Mobitz type II atrioventricular block 12/25/2018  . Acute exacerbation of CHF (congestive heart failure) (HCC) 12/22/2018  . Acute on chronic respiratory failure with hypoxia (HCC) 12/22/2018  . CHF exacerbation (HCC) 12/19/2018  . Acute CHF (congestive heart failure) (HCC) 12/16/2018  . Diabetes mellitus without complication (HCC) 12/11/2018  . Acute on chronic systolic (congestive) heart failure (HCC) 12/11/2018  . Abdominal pain 12/11/2018  . Nausea vomiting and diarrhea 12/11/2018  . Anemia 12/11/2018  . Evaluation by psychiatric service required   . MDD (major depressive disorder), severe (HCC) 11/25/2018  . Pressure injury of skin 11/08/2018  . Elevated troponin 10/18/2018  . HLD (hyperlipidemia) 10/18/2018  . Anxiety 10/18/2018  . CHF (congestive heart failure), NYHA class II, acute on chronic, combined (HCC) 10/18/2018  . Hypertensive urgency 10/12/2018  . Mild renal insufficiency 10/12/2018   . Cellulitis 10/12/2018  . Penile cellulitis   . Adjustment disorder with mixed disturbance of emotions and conduct   . Sleep apnea 10/03/2018  . Hypokalemia 09/17/2018  . Scrotal swelling 09/17/2018  . Urinary hesitancy   . Constipation   . Acute on chronic combined systolic and diastolic CHF (congestive heart failure) (HCC) 08/25/2018  . Tobacco use 08/18/2018  . Homelessness 08/08/2018  . Smoker 08/08/2018  . Prostate enlargement 03/16/2018  . Aortic atherosclerosis (HCC) 03/16/2018  . 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 Chronic foot pain   . Schizoaffective disorder, bipolar type (HCC) 09/30/2016  . Substance induced mood disorder (HCC) 03/13/2015  . Schizophrenia, paranoid type (HCC) 01/17/2015  . Drug hallucinosis (HCC) 10/08/2014  . Chronic paranoid schizophrenia (HCC) 09/07/2014  . 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 Urinary retention   . Cocaine use disorder, severe, dependence (HCC)   . Essential hypertension 03/28/2013  . Insulin-requiring or dependent type II diabetes mellitus (HCC) 03/15/2013    Orientation RESPIRATION BLADDER Height & Weight     Self, Situation, Time, Place  Normal Continent Weight: 229 lb 8 oz (104.1 kg) Height:  5\' 9"  (175.3 cm)  BEHAVIORAL SYMPTOMS/MOOD NEUROLOGICAL BOWEL NUTRITION STATUS      Continent Diet  AMBULATORY STATUS COMMUNICATION OF NEEDS Skin   Total Care Verbally Normal                       Personal Care Assistance Level of Assistance  Bathing, Feeding, Dressing, Total care Bathing Assistance: Maximum assistance Feeding assistance: Limited assistance Dressing Assistance: Limited assistance Total Care Assistance: Maximum assistance   Functional  Limitations Info  Sight, Speech, Hearing Sight Info: Impaired Hearing Info: Impaired Speech Info: Impaired    SPECIAL CARE FACTORS FREQUENCY  PT (By licensed PT), OT (By licensed OT)      PT Frequency: 5x per week OT Frequency: 5x per week            Contractures Contractures Info: Not present    Additional Factors Info  Code Status, Allergies, Psychotropic Code Status Info: Full Allergies Info: Haldol Psychotropic Info: Xanax 0.25 mg, Abilify 10 mg, Ambien 5 mg.         Current Medications (01/03/2019):  This is the current hospital active medication list Current Facility-Administered Medications  Medication Dose Route Frequency Provider Last Rate Last Dose  . 0.9 %  sodium chloride infusion  250 mL Intravenous PRN Taylor Noble Latif, DO      . acetaminophen (TYLENOL) tablet 650 mg  650 mg Oral Q4H PRN Taylor Noble [place], DO   650 mg at 01/03/19 6440  . ALPRAZolam Duanne Moron) tablet 0.25 mg  0.25 mg Oral BID PRN Taylor Noble Latif, DO   0.25 mg at 01/03/19 0950  . ARIPiprazole (ABILIFY) tablet 10 mg  10 mg Oral Daily Taylor Noble [place], DO   10 mg at 01/03/19 3474  . carbamazepine (TEGRETOL) tablet 200 mg  200 mg Oral BID [name], Mojeed, MD      . enoxaparin (LOVENOX) injection 40 mg  40 mg Subcutaneous Q24H [name] [place], DO   40 mg at 01/02/19 2111  . furosemide (LASIX) injection 80 mg  80 mg Intravenous BID Taylor Noble [place], DO   80 mg at 01/03/19 2595  . insulin aspart (novoLOG) injection 0-15 Units  0-15 Units Subcutaneous TID WC Taylor Noble [place], DO   3 Units at 01/03/19 [phone]  . insulin aspart (novoLOG) injection 0-5 Units  0-5 Units Subcutaneous QHS Taylor Noble [name], Taylor   2 Units at 01/02/19 2112  . [START ON 01/04/2019] lisinopril (ZESTRIL) tablet 20 mg  20 mg Oral Daily Dorothy Spark, MD      . ondansetron [facility]) injection 4 mg  4 mg Intravenous Q6H PRN [name] Latif, DO      . piperacillin-tazobactam (ZOSYN) IVPB 3.375 g  3.375 g Intravenous Q8H [name], [name] 12.5 mL/hr at 01/03/19 0850 3.375 g at 01/03/19 0850  . potassium chloride SA ([name]-DUR) CR tablet 20 mEq  20 mEq Oral BID Taylor Noble [place], DO   20 mEq at  01/03/19 0947  . sodium chloride flush (NS) 0.9 % injection 3 mL  3 mL Intravenous Q12H [name], Georgina [place], DO   3 mL at 01/02/19 2116  . sodium chloride flush (NS) 0.9 % injection 3 mL  3 mL Intravenous PRN [name] Latif, DO      . zolpidem (AMBIEN) tablet 5 mg  5 mg Oral QHS PRN,MR X 1 [name] [place], DO   5 mg at 01/02/19 2112     Discharge Medications: Please see discharge summary for a list of discharge medications.  Relevant Imaging Results:  Relevant Lab Results:   Additional [place], LCSW

## 2019-01-03 NOTE — Progress Notes (Signed)
Inpatient Diabetes Program Recommendations  AACE/ADA: New Consensus Statement on Inpatient Glycemic Control (2015)  Target Ranges:  Prepandial:   less than 140 mg/dL      Peak postprandial:   less than 180 mg/dL (1-2 hours)      Critically ill patients:  140 - 180 mg/dL   Lab Results  Component Value Date   GLUCAP 156 (H) 01/03/2019   HGBA1C 9.7 (H) 12/04/2018    Review of Glycemic Control Results for [name], [name] (MRN [id number]) as of 01/03/2019 11:11  Ref. Range 01/02/2019 16:53 01/02/2019 20:59 01/03/2019 07:24  Glucose-Capillary Latest Ref Range: 70 - 99 mg/dL 179 (H) 205 (H) 156 (H)  Diabetes history: DM 2 Outpatient Diabetes medications: Glipzide 5 mg bid Current orders for Inpatient glycemic control:  Novolog moderate tid with meals  Inpatient Diabetes Program Recommendations:    Patient was seen by Diabetes coord. On 12/30/18.    Per diabetes coordinator's note on 7/1  " Patient says he gets paid tomorrow. Patient said he didn't have a home and as soon as he will be discharged he will be going back to the streets and use crack cocaine".  Difficult social situation due to homelessness- Likely not a candidate for home insulin due.  At d/c consider adding Metformin.  Thanks,  Adah Perl, RN, BC-ADM Inpatient Diabetes Coordinator Pager [phone] (8a-5p)

## 2019-01-03 NOTE — Evaluation (Signed)
Occupational Therapy Evaluation Patient Details Name: [name] MRN: [id number] DOB: 1960-10-30 Today's Date: 01/03/2019    History of Present Illness Pt is a 58 y.o. male with PMH significant for cocaine abuse, uncontrolled diabetes mellitus type 2, homelessness, hypertension, hepatitis C, schizophrenia, sleep apnea, chronic combined systolic and diastolic CHF, along with other comorbidities. He presented to the ED 01/02/2019 with leg swelling and shortness of breath. Of note he was hospitalized and left [facility] 01/01/2019. He left AMA 7/4 but never truly left the hospital and returned to the ED for re-evaluation. He has a history of admissions and leaving AMA on other occasions as well.   Clinical Impression   PTA, pt reports homelessness, ambulating with RW, and struggling with LB ADL tasks. He is currently intermittently confused and agitated. He was agreeable to participation in OT evaluation and pleasant although at times inappropriate. Pt requiring min assist for simulated toilet transfer and mod assist for LB ADL. At current functional level, recommend SNF level rehabilitation as well as 24 hour assistance to maximize recovery. OT will continue to follow while admitted.     Follow Up Recommendations  SNF;Supervision/Assistance - 24 hour    Equipment Recommendations  None recommended by OT    Recommendations for Other Services       Precautions / Restrictions Precautions Precautions: Fall Precaution Comments: Pt with anger management difficultiues and leaves AMA regularly Restrictions Weight Bearing Restrictions: No      Mobility Bed Mobility Overal bed mobility: Modified Independent             General bed mobility comments: increased time  Transfers Overall transfer level: Needs assistance Equipment used: Rolling walker (2 wheeled) Transfers: Sit to/from Stand Sit to Stand: Min guard         General transfer comment: Guarding assist for safety. Some  posterior bias.     Balance Overall balance assessment: History of Falls;Needs assistance Sitting-balance support: Feet supported;Bilateral upper extremity supported;No upper extremity supported Sitting balance-Leahy Scale: Good     Standing balance support: Bilateral upper extremity supported;During functional activity;Single extremity supported Standing balance-Leahy Scale: Poor Standing balance comment: relies on BUE support                           ADL either performed or assessed with clinical judgement   ADL Overall ADL's : Needs assistance/impaired Eating/Feeding: Set up;Sitting;Bed level   Grooming: Supervision/safety;Sitting   Upper Body Bathing: Supervision/ safety;Sitting   Lower Body Bathing: Moderate assistance;Sit to/from stand   Upper Body Dressing : Supervision/safety;Sitting   Lower Body Dressing: Moderate assistance;Sit to/from stand   Toilet Transfer: Minimal Insurance claims handler Details (indicate cue type and reason): taking steps at EOB for simulation Toileting- Clothing Manipulation and Hygiene: Minimal assistance;Sit to/from stand       Functional mobility during ADLs: Minimal assistance;Rolling walker General ADL Comments: Pt falling asleep intermittently making him unsafe. Some instability noted.      Vision Baseline Vision/History: Wears glasses(they are lost) Patient Visual Report: No change from baseline Vision Assessment?: No apparent visual deficits     Perception     Praxis      Pertinent Vitals/Pain Pain Assessment: Faces Faces Pain Scale: Hurts little more Pain Location: L leg; attempting to show OT lesions on leg but none visualized.  Pain Descriptors / Indicators: Aching;Burning;Sore Pain Intervention(s): Limited activity within patient's tolerance;Monitored during session;Repositioned     Hand Dominance Right   Extremity/Trunk Assessment Upper Extremity Assessment  Upper Extremity Assessment:  Generalized weakness   Lower Extremity Assessment Lower Extremity Assessment: Generalized weakness;LLE deficits/detail RLE Deficits / Details: swelling  LLE Deficits / Details: swelling; reports pain and attempting to show ulcers to OT but none were present       Communication Communication Communication: Expressive difficulties   Cognition Arousal/Alertness: Lethargic(intermittently) Behavior During Therapy: Restless Overall Cognitive Status: History of cognitive impairments - at baseline Area of Impairment: Memory;Following commands;Safety/judgement;Awareness;Problem solving;Orientation;Attention                 Orientation Level: Place;Person Current Attention Level: Selective Memory: Decreased short-term memory Following Commands: Follows one step commands consistently;Follows multi-step commands inconsistently Safety/Judgement: Decreased awareness of safety;Decreased awareness of deficits Awareness: Emergent Problem Solving: Slow processing;Decreased initiation;Difficulty sequencing General Comments: Pt reporting that there was something/someone "messing with him" stating that a demonic or supernatural being is messing with him and this is impacting his recovery.    General Comments  HR 92     Exercises     Shoulder Instructions      Home Living Family/patient expects to be discharged to:: Shelter/Homeless                                 Additional Comments: homeless      Prior Functioning/Environment Level of Independence: Independent with assistive device(s)  Gait / Transfers Assistance Needed: Uses RW for mobility ADL's / Homemaking Assistance Needed: needs assistance with LB ADL   Comments: RW is lost and broken        OT Problem List: Decreased strength;Decreased range of motion;Decreased activity tolerance;Impaired balance (sitting and/or standing);Decreased safety awareness;Decreased knowledge of use of DME or AE;Decreased knowledge  of precautions      OT Treatment/Interventions: Self-care/ADL training;Therapeutic exercise;Energy conservation;DME and/or AE instruction;Therapeutic activities;Patient/family education;Balance training;Cognitive remediation/compensation    OT Goals(Current goals can be found in the care plan section) Acute Rehab OT Goals Patient Stated Goal: Recover OT Goal Formulation: With patient Time For Goal Achievement: 01/17/19 Potential to Achieve Goals: Fair ADL Goals Pt Will Perform Upper Body Dressing: with modified independence;sitting Pt Will Perform Lower Body Dressing: with modified independence;sit to/from stand Pt Will Transfer to Toilet: with modified independence;ambulating;regular height toilet Pt Will Perform Toileting - Clothing Manipulation and hygiene: with modified independence;sit to/from stand  OT Frequency: Min 2X/week   Barriers to D/C:            Co-evaluation              AM-PAC OT "6 Clicks" Daily Activity     Outcome Measure Help from another person eating meals?: None Help from another person taking care of personal grooming?: None Help from another person toileting, which includes using toliet, bedpan, or urinal?: A Little Help from another person bathing (including washing, rinsing, drying)?: A Lot Help from another person to put on and taking off regular upper body clothing?: None Help from another person to put on and taking off regular lower body clothing?: A Lot 6 Click Score: 19   End of Session Equipment Utilized During Treatment: Rolling walker;Gait belt Nurse Communication: Mobility status(pt left in bed due to falling asleep)  Activity Tolerance: Patient tolerated treatment well Patient left: in bed;with call bell/phone within reach;with bed alarm set  OT Visit Diagnosis: Unsteadiness on feet (R26.81);Other abnormalities of gait and mobility (R26.89);Muscle weakness (generalized) (M62.81);Other symptoms and signs involving cognitive function  Time: [id number] OT Time Calculation (min): 19 min Charges:  OT General Charges $OT Visit: 1 Visit OT Evaluation $OT Eval Moderate Complexity: 1 [address]Mod  Anyae Griffith Anne [facility] Center[facility]rum, OTR/L Acute Rehabilitation [facility]    Keashia Haskins A Delvon Chipps 01/03/2019, 9:32 AM

## 2019-01-04 ENCOUNTER — Encounter (HOSPITAL_COMMUNITY): Payer: Self-pay | Admitting: Emergency Medicine

## 2019-01-04 ENCOUNTER — Emergency Department (HOSPITAL_COMMUNITY)
Admission: EM | Admit: 2019-01-04 | Discharge: 2019-01-04 | Disposition: A | Discharge status: Home / Self Care | Payer: Medicaid Other | Source: Home / Self Care | Attending: Emergency Medicine | Admitting: Emergency Medicine

## 2019-01-04 ENCOUNTER — Other Ambulatory Visit: Payer: Self-pay

## 2019-01-04 ENCOUNTER — Emergency Department (HOSPITAL_COMMUNITY)
Admission: EM | Admit: 2019-01-04 | Discharge: 2019-01-04 | Disposition: A | Discharge status: Home / Self Care | Payer: Medicaid Other | Attending: Emergency Medicine | Admitting: Emergency Medicine

## 2019-01-04 DIAGNOSIS — Z7984 Long term (current) use of oral hypoglycemic drugs: Secondary | ICD-10-CM | POA: Insufficient documentation

## 2019-01-04 DIAGNOSIS — I10 Essential (primary) hypertension: Secondary | ICD-10-CM | POA: Insufficient documentation

## 2019-01-04 DIAGNOSIS — Z5900 Homelessness unspecified: Secondary | ICD-10-CM

## 2019-01-04 DIAGNOSIS — E119 Type 2 diabetes mellitus without complications: Secondary | ICD-10-CM | POA: Diagnosis not present

## 2019-01-04 DIAGNOSIS — I5043 Acute on chronic combined systolic (congestive) and diastolic (congestive) heart failure: Secondary | ICD-10-CM | POA: Insufficient documentation

## 2019-01-04 DIAGNOSIS — F17210 Nicotine dependence, cigarettes, uncomplicated: Secondary | ICD-10-CM | POA: Insufficient documentation

## 2019-01-04 DIAGNOSIS — Z79899 Other long term (current) drug therapy: Secondary | ICD-10-CM | POA: Insufficient documentation

## 2019-01-04 DIAGNOSIS — I110 Hypertensive heart disease with heart failure: Secondary | ICD-10-CM | POA: Insufficient documentation

## 2019-01-04 DIAGNOSIS — R2243 Localized swelling, mass and lump, lower limb, bilateral: Secondary | ICD-10-CM | POA: Diagnosis present

## 2019-01-04 DIAGNOSIS — R109 Unspecified abdominal pain: Secondary | ICD-10-CM

## 2019-01-04 DIAGNOSIS — R1084 Generalized abdominal pain: Secondary | ICD-10-CM | POA: Insufficient documentation

## 2019-01-04 DIAGNOSIS — I5042 Chronic combined systolic (congestive) and diastolic (congestive) heart failure: Secondary | ICD-10-CM | POA: Insufficient documentation

## 2019-01-04 LAB — COMPREHENSIVE METABOLIC PANEL
ALT: 13 U/L (ref 0–44)
AST: 23 U/L (ref 15–41)
Albumin: 3.2 g/dL — ABNORMAL LOW (ref 3.5–5.0)
Alkaline Phosphatase: 190 U/L — ABNORMAL HIGH (ref 38–126)
Anion gap: 12 (ref 5–15)
BUN: 22 mg/dL — ABNORMAL HIGH (ref 6–20)
CO2: 26 mmol/L (ref 22–32)
Calcium: 8.2 mg/dL — ABNORMAL LOW (ref 8.9–10.3)
Chloride: 101 mmol/L (ref 98–111)
Creatinine, Ser: 1.01 mg/dL (ref 0.61–1.24)
GFR calc Af Amer: >60 mL/min (ref >60)
GFR calc non Af Amer: >60 mL/min (ref >60)
Glucose, Bld: 220 mg/dL — ABNORMAL HIGH (ref 70–99)
Potassium: 3.1 mmol/L — ABNORMAL LOW (ref 3.5–5.1)
Sodium: 139 mmol/L (ref 135–145)
Total Bilirubin: 0.4 mg/dL (ref 0.3–1.2)
Total Protein: 7.6 g/dL (ref 6.5–8.1)

## 2019-01-04 LAB — CBC WITH DIFFERENTIAL/PLATELET
Abs Immature Granulocytes: 0.03 10*3/uL (ref 0.00–0.07)
Basophils Absolute: 0 10*3/uL (ref 0.0–0.1)
Basophils Relative: 0 %
Eosinophils Absolute: 0.1 10*3/uL (ref 0.0–0.5)
Eosinophils Relative: 1 %
HCT: 28.9 % — ABNORMAL LOW (ref 39.0–52.0)
Hemoglobin: 8.3 g/dL — ABNORMAL LOW (ref 13.0–17.0)
Immature Granulocytes: 0 %
Lymphocytes Relative: 11 %
Lymphs Abs: 0.8 10*3/uL (ref 0.7–4.0)
MCH: 22.8 pg — ABNORMAL LOW (ref 26.0–34.0)
MCHC: 28.7 g/dL — ABNORMAL LOW (ref 30.0–36.0)
MCV: 79.4 fL — ABNORMAL LOW (ref 80.0–100.0)
Monocytes Absolute: 0.7 10*3/uL (ref 0.1–1.0)
Monocytes Relative: 10 %
Neutro Abs: 5.5 10*3/uL (ref 1.7–7.7)
Neutrophils Relative %: 78 %
Platelets: 279 10*3/uL (ref 150–400)
RBC: 3.64 MIL/uL — ABNORMAL LOW (ref 4.22–5.81)
RDW: 21.1 % — ABNORMAL HIGH (ref 11.5–15.5)
WBC: 7.2 10*3/uL (ref 4.0–10.5)
nRBC: 0.4 % — ABNORMAL HIGH (ref 0.0–0.2)

## 2019-01-04 LAB — LIPASE, BLOOD: Lipase: 27 U/L (ref 11–51)

## 2019-01-04 MED ORDER — FUROSEMIDE 10 MG/ML IJ SOLN
60.0000 mg | Freq: Once | INTRAMUSCULAR | Status: AC
  Administered 2019-01-04: 03:00:00 60 mg via INTRAMUSCULAR
  Filled 2019-01-04: qty 8

## 2019-01-04 NOTE — ED Provider Notes (Signed)
Patient is overall well-appearing.  Repeat abdominal exam without focal tenderness.  Labs without significant abnormality.  Patient is well-known to myself and this department.  Stable for discharge from the ER.  Primary care follow-up.  Baseline anemia  Hemoglobin  Date Value Ref Range Status  01/04/2019 8.3 (L) 13.0 - 17.0 g/dL Final  01/02/2019 8.6 (L) 13.0 - 17.0 g/dL Final  01/02/2019 8.7 (L) 13.0 - 17.0 g/dL Final  01/01/2019 8.5 (L) 13.0 - 17.0 g/dL Final      Jola Schmidt, MD 01/04/19 [id number]

## 2019-01-04 NOTE — Discharge Instructions (Signed)
Please take the medicines which have been prescribed for you.

## 2019-01-04 NOTE — ED Notes (Signed)
Bed: WTR5 Expected date:  Expected time:  Means of arrival:  Comments: 

## 2019-01-04 NOTE — ED Triage Notes (Signed)
Patient is complaining of having abdominal pain. Patient states it still hurts. Patient just left about 30 minutes ago.

## 2019-01-04 NOTE — ED Notes (Signed)
Pt d/c home per MD order. Discharge summary reviewed, pt verbalizes understanding.  

## 2019-01-04 NOTE — ED Provider Notes (Signed)
[facility] DEPT Provider Note   CSN: [id number] Arrival date & time: 01/04/19  0231    History   Chief Complaint Chief Complaint  Patient presents with  . Leg Swelling    HPI [name] is a 58 y.o. male.   The history is provided by the patient.  He has history of hypertension, diabetes, combined systolic and diastolic heart failure, schizophrenia, cocaine abuse who states that he wants to go back to room 1422 and get admitted to a nursing home for 2 weeks.  He states that police had picked him up and were going to take him to jail, but decided to bring him to the hospital.  His legs are swollen and his abdomen is swollen.  Past Medical History:  Diagnosis Date  . Chronic foot pain   . Cocaine abuse ([place])   . Diabetes mellitus without complication ([place])   . Hepatitis C    unsure   . Homelessness   . Hypertension   . Neuropathy   . Polysubstance abuse ([place])   . Schizophrenia ([place])   . Sleep apnea   . Systolic and diastolic CHF, chronic [facility])     Patient Active Problem List   Diagnosis Date Noted  . Acute on chronic systolic CHF (congestive heart failure) ([place]) 01/02/2019  . Acute on chronic combined systolic (congestive) and diastolic (congestive) heart failure ([place]) 01/02/2019  . Acute on chronic systolic heart failure ([place]) 12/26/2018  . Mobitz type II atrioventricular block 12/25/2018  . Acute exacerbation of CHF (congestive heart failure) ([place]) 12/22/2018  . Acute on chronic respiratory failure with hypoxia ([place]) 12/22/2018  . CHF exacerbation ([place]) 12/19/2018  . Acute CHF (congestive heart failure) ([place]) 12/16/2018  . Diabetes mellitus without complication ([place]) 52/84/1324  . Acute on chronic systolic (congestive) heart failure ([place]) 12/11/2018  . Abdominal pain 12/11/2018  . Nausea vomiting and diarrhea 12/11/2018  . Anemia 12/11/2018  . Evaluation by psychiatric service required   . MDD (major depressive disorder),  severe ([place]) 11/25/2018  . Pressure injury of skin 11/08/2018  . Elevated troponin 10/18/2018  . HLD (hyperlipidemia) 10/18/2018  . Anxiety 10/18/2018  . CHF (congestive heart failure), NYHA class II, acute on chronic, combined ([place]) 10/18/2018  . Hypertensive urgency 10/12/2018  . Mild renal insufficiency 10/12/2018  . Cellulitis 10/12/2018  . Penile cellulitis   . Adjustment disorder with mixed disturbance of emotions and conduct   . Sleep apnea 10/03/2018  . Hypokalemia 09/17/2018  . Scrotal swelling 09/17/2018  . Urinary hesitancy   . Constipation   . Acute on chronic combined systolic and diastolic CHF (congestive heart failure) ([place]) 08/25/2018  . Tobacco use 08/18/2018  . Homelessness 08/08/2018  . Smoker 08/08/2018  . Prostate enlargement 03/16/2018  . Aortic atherosclerosis ([place]) 03/16/2018  . 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 Chronic foot pain   . Schizoaffective disorder, bipolar type ([place]) 09/30/2016  . Substance induced mood disorder ([place]) 03/13/2015  . Schizophrenia, paranoid type ([place]) 01/17/2015  . Drug hallucinosis ([place]) 10/08/2014  . Chronic paranoid schizophrenia ([place]) 09/07/2014  . 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 Urinary retention   . Cocaine use disorder, severe, dependence ([place])   . Essential hypertension 03/28/2013  . Insulin-requiring or dependent type II diabetes mellitus ([place]) 03/15/2013    Past Surgical History:  Procedure Laterality Date  . MULTIPLE TOOTH EXTRACTIONS          Home Medications  Prior to Admission medications   Medication Sig Start Date End Date Taking? Authorizing Provider  amoxicillin-clavulanate (AUGMENTIN) 875-125 MG tablet Take 1 tablet by mouth 2 (two) times daily for 10 days. 01/01/19 01/11/19  Rai, Ripudeep K, MD  ARIPiprazole (ABILIFY) 10 MG tablet Take 1 tablet (10 mg total) by mouth daily. 12/10/18   Pokhrel, Rebekah ChesterfieldLaxman, MD   carbamazepine (TEGRETOL) 200 MG tablet Take 0.5 tablets (100 mg total) by mouth 2 (two) times daily. 11/26/18   Mancel BaleWentz, Elliott, MD  furosemide (LASIX) 40 MG tablet Take 1 tablet (40 mg total) by mouth 2 (two) times daily. 01/01/19 05/31/19  Rai, Ripudeep Kirtland BouchardK, MD  glipiZIDE (GLUCOTROL) 5 MG tablet Take 0.5 tablets (2.5 mg total) by mouth 2 (two) times daily before a meal. 01/01/19   Rai, Ripudeep K, MD  Ipratropium-Albuterol (COMBIVENT) 20-100 MCG/ACT AERS respimat Inhale 1 puff into the lungs every 6 (six) hours. 11/30/18   Palumbo, April, MD  lisinopril (ZESTRIL) 10 MG tablet Take 1 tablet (10 mg total) by mouth daily. 11/26/18   Mancel BaleWentz, Elliott, MD  potassium chloride SA (K-DUR) 20 MEQ tablet Take 1 tablet (20 mEq total) by mouth 2 (two) times daily. 11/30/18   Palumbo, April, MD    Family History Family History  Problem Relation Age of Onset  . Hypertension Other   . Diabetes Other     Social History Social History   Tobacco Use  . Smoking status: Current Every Day Smoker    Packs/day: 1.00    Years: 20.00    Pack years: 20.00    Types: Cigarettes  . Smokeless tobacco: Current User  Substance Use Topics  . Alcohol use: Yes    Comment: Daily Drinker   . Drug use: Yes    Frequency: 7.0 times per week    Types: "Crack" cocaine, Cocaine, Marijuana     Allergies   Haldol [haloperidol]   Review of Systems Review of Systems  All other systems reviewed and are negative.    Physical Exam Updated Vital Signs BP (!) 171/89 (BP Location: Right Arm)   Pulse (!) 106   Temp 98.6 F (37 C) (Oral)   Resp 18   Ht 5\' 9"  (1.753 m)   Wt 104.8 kg   SpO2 96%   BMI 34.11 kg/m   Physical Exam Vitals signs and nursing note reviewed.    58 year old male, resting comfortably and in no acute distress. Vital signs are significant for elevated blood pressure and borderline elevated heart rate. Oxygen saturation is 96%, which is normal. Head is normocephalic and atraumatic. PERRLA, EOMI.  Oropharynx is clear. Neck is nontender and supple without adenopathy or JVD. Back is nontender and there is no CVA tenderness.  There is 2+ presacral edema. Lungs are clear without rales, wheezes, or rhonchi. Chest is nontender. Heart has regular rate and rhythm without murmur. Abdomen is soft, flat, nontender without masses or hepatosplenomegaly and peristalsis is normoactive. Extremities have 2-3+ edema, full range of motion is present. Skin is warm and dry without rash. Neurologic: Awake and alert, cranial nerves are intact, there are no motor or sensory deficits.  ED Treatments / Results   Procedures Procedures   Medications Ordered in ED Medications  furosemide (LASIX) injection 60 mg (has no administration in time range)     Initial Impression / Assessment and Plan / ED Course  I have reviewed the triage vital signs and the nursing notes.  Patient well-known to the emergency department with a schizophrenia, heart  failure, frequently leaving against advice.  Of note, as I am going in to see him, he is eating [employer] pizza.  Old records are reviewed, and he had been admitted to the hospital multiple times over the last month leaving AGAINST MEDICAL ADVICE on each occasion.  Most recent AGAINST MEDICAL ADVICE discharge was yesterday at about 1 PM.  He is stating that he wants to go back to the room he was discharged from in order to go to skilled nursing home for 2 weeks.  However, it is noted that his edema is significantly less than it has been in the recent past, he is maintaining adequate oxygen saturation.  I do not see an indication for readmitting him.  Since June 3, he has been hospitalized 8 times and left AGAINST MEDICAL ADVICE 7 times.  He will be given a dose of parenteral furosemide and discharged.  Final Clinical Impressions(s) / ED Diagnoses   Final diagnoses:  Acute on chronic combined systolic and diastolic heart failure [facility](HCC)  Homeless    ED Discharge Orders     None       Dione BoozeGlick, Jonnatan Hanners, MD 01/04/19 [phone]

## 2019-01-04 NOTE — ED Notes (Signed)
Pt eating [employer] that he brought with him

## 2019-01-04 NOTE — ED Triage Notes (Signed)
Patient is complaining of abdominal and leg swelling. Patient was seen for this twice on January 02, 2019.

## 2019-01-04 NOTE — ED Provider Notes (Signed)
[name]-EMERGENCY DEPT Provider Note   CSN: [id number] Arrival date & time: 01/04/19  0550    History   Chief Complaint Chief Complaint  Patient presents with  . Abdominal Pain    HPI [name] is a 58 y.o. male.   The history is provided by the patient.  Abdominal Pain He has history of hypertension, diabetes, schizophrenia, substance abuse, combined systolic and diastolic heart failure and comes in complaining of generalized abdominal pain which started this evening.  He had been in the emergency department a few hours earlier with complaints of the leg swelling and had been given a dose of furosemide and he states he has urinated multiple times.  However, he is complaining of generalized abdominal pain.  He is unable to put a number on it.  He denies nausea or vomiting.  Of note, he had been admitted to the hospital yesterday and left AGAINST MEDICAL ADVICE during the afternoon.  When he was here earlier, he was requesting readmission so that he could go to a nursing home for 2 weeks.  Past Medical History:  Diagnosis Date  . Chronic foot pain   . Cocaine abuse (HCC)   . Diabetes mellitus without complication (HCC)   . Hepatitis C    unsure   . Homelessness   . Hypertension   . Neuropathy   . Polysubstance abuse (HCC)   . Schizophrenia (HCC)   . Sleep apnea   . Systolic and diastolic CHF, chronic [facility](HCC)     Patient Active Problem List   Diagnosis Date Noted  . Acute on chronic systolic CHF (congestive heart failure) (HCC) 01/02/2019  . 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 Acute on chronic systolic heart failure (HCC) 12/26/2018  . Mobitz type II atrioventricular block 12/25/2018  . Acute exacerbation of CHF (congestive heart failure) (HCC) 12/22/2018  . Acute on chronic respiratory failure with hypoxia (HCC) 12/22/2018  . CHF exacerbation (HCC) 12/19/2018  .  Acute CHF (congestive heart failure) (HCC) 12/16/2018  . Diabetes mellitus without complication (HCC) 12/11/2018  . Acute on chronic systolic (congestive) heart failure (HCC) 12/11/2018  . Abdominal pain 12/11/2018  . Nausea vomiting and diarrhea 12/11/2018  . Anemia 12/11/2018  . Evaluation by psychiatric service required   . MDD (major depressive disorder), severe (HCC) 11/25/2018  . Pressure injury of skin 11/08/2018  . Elevated troponin 10/18/2018  . HLD (hyperlipidemia) 10/18/2018  . Anxiety 10/18/2018  . CHF (congestive heart failure), NYHA class II, acute on chronic, combined (HCC) 10/18/2018  . Hypertensive urgency 10/12/2018  . Mild renal insufficiency 10/12/2018  . Cellulitis 10/12/2018  . Penile cellulitis   . Adjustment disorder with mixed disturbance of emotions and conduct   . Sleep apnea 10/03/2018  . Hypokalemia 09/17/2018  . Scrotal swelling 09/17/2018  . Urinary hesitancy   . Constipation   . Acute on chronic combined systolic and diastolic CHF (congestive heart failure) (HCC) 08/25/2018  . Tobacco use 08/18/2018  . Homelessness 08/08/2018  . Smoker 08/08/2018  . Prostate enlargement 03/16/2018  . Aortic atherosclerosis (HCC) 03/16/2018  . 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 Chronic foot pain   . Schizoaffective disorder, bipolar type (HCC) 09/30/2016  . Substance induced mood disorder (HCC) 03/13/2015  . Schizophrenia, paranoid type (HCC) 01/17/2015  . Drug hallucinosis (HCC) 10/08/2014  . Chronic paranoid schizophrenia (HCC) 09/07/2014  . 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 Urinary retention   . Cocaine use disorder, severe, dependence (HCC)   . Essential hypertension 03/28/2013  . 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 MULTIPLE TOOTH EXTRACTIONS          Home  Medications    Prior to Admission medications   Medication Sig Start Date End Date Taking? Authorizing Provider  amoxicillin-clavulanate (AUGMENTIN) 875-125 MG tablet Take 1 tablet by mouth 2 (two) times daily for 10 days. 01/01/19 01/11/19  Rai, Ripudeep K, MD  ARIPiprazole (ABILIFY) 10 MG tablet Take 1 tablet (10 mg total) by mouth daily. 12/10/18   Pokhrel, Rebekah ChesterfieldLaxman, MD  carbamazepine (TEGRETOL) 200 MG tablet Take 0.5 tablets (100 mg total) by mouth 2 (two) times daily. 11/26/18   Mancel BaleWentz, Elliott, MD  furosemide (LASIX) 40 MG tablet Take 1 tablet (40 mg total) by mouth 2 (two) times daily. 01/01/19 05/31/19  Rai, Ripudeep Kirtland BouchardK, MD  glipiZIDE (GLUCOTROL) 5 MG tablet Take 0.5 tablets (2.5 mg total) by mouth 2 (two) times daily before a meal. 01/01/19   Rai, Ripudeep K, MD  Ipratropium-Albuterol (COMBIVENT) 20-100 MCG/ACT AERS respimat Inhale 1 puff into the lungs every 6 (six) hours. 11/30/18   Palumbo, April, MD  lisinopril (ZESTRIL) 10 MG tablet Take 1 tablet (10 mg total) by mouth daily. 11/26/18   Mancel BaleWentz, Elliott, MD  potassium chloride SA (K-DUR) 20 MEQ tablet Take 1 tablet (20 mEq total) by mouth 2 (two) times daily. 11/30/18   Palumbo, April, MD    Family History Family History  Problem Relation Age of Onset  . Hypertension Other   . Diabetes Other     Social History Social History   Tobacco Use  . Smoking status: Current Every Day Smoker    Packs/day: 1.00    Years: 20.00    Pack years: 20.00    Types: Cigarettes  . Smokeless tobacco: Current User  Substance Use Topics  . Alcohol use: Yes    Comment: Daily Drinker   . Drug use: Yes    Frequency: 7.0 times per week    Types: "Crack" cocaine, Cocaine, Marijuana     Allergies   Haldol [haloperidol]   Review of Systems Review of Systems  Gastrointestinal: Positive for abdominal pain.  All other systems reviewed and are negative.    Physical Exam Updated Vital Signs BP (!) 149/89 (BP Location: Right Arm)   Pulse 92   Temp 98.5  F (36.9 C) (Oral)   Resp 16   Ht 5\' 9"  (1.753 m)   Wt 104.8 kg   SpO2 100%   BMI 34.11 kg/m   Physical Exam Vitals signs and nursing note reviewed.    58 year old male, resting comfortably and in no acute distress. Vital signs are significant for elevated blood pressure. Oxygen saturation is 100%, which is normal. Head is normocephalic and atraumatic. PERRLA, EOMI. Oropharynx is clear. Neck is nontender and supple without adenopathy or JVD. Back is nontender and there is no CVA tenderness.  There is 2+ presacral edema. Lungs are clear without rales, wheezes, or rhonchi. Chest is nontender. Heart has regular rate and rhythm without murmur. Abdomen is soft, flat, with mild tenderness diffusely.  There is no rebound or guarding.  There are no masses or hepatosplenomegaly and peristalsis is normoactive. Extremities have 2+ edema, full range of motion is present. Skin is warm and dry without rash. Neurologic: Mental status is normal,  cranial nerves are intact, there are no motor or sensory deficits.  ED Treatments / Results  Labs (all labs ordered are listed, but only abnormal results are displayed) Labs Reviewed  COMPREHENSIVE METABOLIC PANEL  LIPASE, BLOOD  CBC WITH DIFFERENTIAL/PLATELET   Procedures Procedures  Medications Ordered in ED Medications - No data to display   Initial Impression / Assessment and Plan / ED Course  I have reviewed the triage vital signs and the nursing notes.  Pertinent lab results that were available during my care of the patient were reviewed by me and considered in my medical decision making (see chart for details).  Complains of abdominal pain with benign exam.  Old records are reviewed, and he has had 7 hospitalizations in the past month during which he left [place].  He had been here earlier this evening requesting to be readmitted so that he could go to a nursing home for 2 weeks.  His exam is benign today.  Will check  screening labs.  However, currently I do not see an indication for hospital readmission.  Case is signed out to Dr. Venora Maples.  Final Clinical Impressions(s) / ED Diagnoses   Final diagnoses:  Abdominal pain, unspecified abdominal location    ED Discharge Orders    None       Delora Fuel, MD 32/95/18 [phone]

## 2019-01-05 LAB — HIV ANTIBODY (ROUTINE TESTING W REFLEX): HIV Screen 4th Generation wRfx: NONREACTIVE

## 2019-01-07 LAB — CULTURE, BLOOD (ROUTINE X 2)
Culture: NO GROWTH
Culture: NO GROWTH
Special Requests: ADEQUATE
Special Requests: ADEQUATE

## 2019-01-08 ENCOUNTER — Other Ambulatory Visit: Payer: Self-pay

## 2019-01-08 ENCOUNTER — Emergency Department (HOSPITAL_COMMUNITY)
Admission: EM | Admit: 2019-01-08 | Discharge: 2019-01-08 | Disposition: A | Discharge status: Home / Self Care | Payer: Medicaid Other | Attending: Emergency Medicine | Admitting: Emergency Medicine

## 2019-01-08 ENCOUNTER — Encounter (HOSPITAL_COMMUNITY): Payer: Self-pay

## 2019-01-08 DIAGNOSIS — Z7984 Long term (current) use of oral hypoglycemic drugs: Secondary | ICD-10-CM | POA: Insufficient documentation

## 2019-01-08 DIAGNOSIS — F17210 Nicotine dependence, cigarettes, uncomplicated: Secondary | ICD-10-CM | POA: Insufficient documentation

## 2019-01-08 DIAGNOSIS — M79605 Pain in left leg: Secondary | ICD-10-CM | POA: Insufficient documentation

## 2019-01-08 DIAGNOSIS — Z590 Homelessness: Secondary | ICD-10-CM | POA: Insufficient documentation

## 2019-01-08 DIAGNOSIS — I5042 Chronic combined systolic (congestive) and diastolic (congestive) heart failure: Secondary | ICD-10-CM | POA: Diagnosis not present

## 2019-01-08 DIAGNOSIS — E119 Type 2 diabetes mellitus without complications: Secondary | ICD-10-CM | POA: Insufficient documentation

## 2019-01-08 DIAGNOSIS — Z79899 Other long term (current) drug therapy: Secondary | ICD-10-CM | POA: Diagnosis not present

## 2019-01-08 DIAGNOSIS — I110 Hypertensive heart disease with heart failure: Secondary | ICD-10-CM | POA: Diagnosis not present

## 2019-01-08 DIAGNOSIS — M79604 Pain in right leg: Secondary | ICD-10-CM | POA: Diagnosis not present

## 2019-01-08 MED ORDER — ACETAMINOPHEN 500 MG PO TABS: 500.0000 mg | ORAL_TABLET | Freq: Four times a day (QID) | ORAL | 0 refills | Status: DC | PRN

## 2019-01-08 MED ORDER — ACETAMINOPHEN 325 MG PO TABS
650.0000 mg | ORAL_TABLET | Freq: Once | ORAL | Status: AC
  Administered 2019-01-08: 650 mg via ORAL
  Filled 2019-01-08: qty 2

## 2019-01-08 NOTE — ED Notes (Signed)
Patient ambulated down hall and back.

## 2019-01-08 NOTE — ED Provider Notes (Signed)
[place] DEPT Provider Note   CSN: [id number] Arrival date & time: 01/08/19  7341     History   Chief Complaint Chief Complaint  Patient presents with  . Leg Pain    HPI CHANDON LAZCANO is a 58 y.o. male.     The history is provided by the patient and medical records. No language interpreter was used.  Leg Pain Associated symptoms: no fever      58 year old male, who is homeless, history of insulin-dependent diabetes, paranoid schizophrenia, polysubstance abuse, malingering, noncompliant, brought here via EMS for evaluation of leg pain.  Patient is sleepy and does not want to engage in conversation much at this point.  He does express pain to his right leg ongoing for 2 weeks.  States he also has pain left leg but right is worse.  Denies any trauma.  Denies any fever.  No other complaint.  Patient has a care plan.  Past Medical History:  Diagnosis Date  . Chronic foot pain   . Cocaine abuse ([place])   . Diabetes mellitus without complication ([place])   . Hepatitis C    unsure   . Homelessness   . Hypertension   . Neuropathy   . Polysubstance abuse ([place])   . Schizophrenia ([place])   . Sleep apnea   . Systolic and diastolic CHF, chronic [facility])     Patient Active Problem List   Diagnosis Date Noted  . Acute on chronic systolic CHF (congestive heart failure) ([place]) 01/02/2019  . Acute on chronic combined systolic (congestive) and diastolic (congestive) heart failure ([place]) 01/02/2019  . Acute on chronic systolic heart failure ([place]) 12/26/2018  . Mobitz type II atrioventricular block 12/25/2018  . Acute exacerbation of CHF (congestive heart failure) ([place]) 12/22/2018  . Acute on chronic respiratory failure with hypoxia ([place]) 12/22/2018  . CHF exacerbation (Home) 12/19/2018  . Acute CHF (congestive heart failure) ([place]) 12/16/2018  . Diabetes mellitus without complication ([place]) 93/79/0240  . Acute on chronic systolic (congestive) heart failure  ([place]) 12/11/2018  . Abdominal pain 12/11/2018  . Nausea vomiting and diarrhea 12/11/2018  . Anemia 12/11/2018  . Evaluation by psychiatric service required   . MDD (major depressive disorder), severe ([place]) 11/25/2018  . Pressure injury of skin 11/08/2018  . Elevated troponin 10/18/2018  . HLD (hyperlipidemia) 10/18/2018  . Anxiety 10/18/2018  . CHF (congestive heart failure), NYHA class II, acute on chronic, combined ([place]) 10/18/2018  . Hypertensive urgency 10/12/2018  . Mild renal insufficiency 10/12/2018  . Cellulitis 10/12/2018  . Penile cellulitis   . Adjustment disorder with mixed disturbance of emotions and conduct   . Sleep apnea 10/03/2018  . Hypokalemia 09/17/2018  . Scrotal swelling 09/17/2018  . Urinary hesitancy   . Constipation   . Acute on chronic combined systolic and diastolic CHF (congestive heart failure) ([place]) 08/25/2018  . Tobacco use 08/18/2018  . Homelessness 08/08/2018  . Smoker 08/08/2018  . Prostate enlargement 03/16/2018  . Aortic atherosclerosis ([place]) 03/16/2018  . 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 Chronic foot pain   . Schizoaffective disorder, bipolar type ([place]) 09/30/2016  . Substance induced mood disorder ([place]) 03/13/2015  . Schizophrenia, paranoid type ([place]) 01/17/2015  . Drug hallucinosis ([place]) 10/08/2014  . Chronic paranoid schizophrenia ([place]) 09/07/2014  . Substance or medication-induced bipolar and related disorder with onset during intoxication ([place]) 08/10/2014  . Urinary retention   . Cocaine use disorder, severe, dependence ([place])   . Essential hypertension 03/28/2013  . Insulin-requiring  or dependent type II diabetes mellitus (HCC) 03/15/2013    Past Surgical History:  Procedure Laterality Date  . MULTIPLE TOOTH EXTRACTIONS          Home Medications    Prior to Admission medications   Medication Sig Start Date End Date Taking? Authorizing Provider  amoxicillin-clavulanate (AUGMENTIN) 875-125 MG tablet Take 1  tablet by mouth 2 (two) times daily for 10 days. 01/01/19 01/11/19  Rai, Ripudeep K, MD  ARIPiprazole (ABILIFY) 10 MG tablet Take 1 tablet (10 mg total) by mouth daily. 12/10/18   Pokhrel, Rebekah ChesterfieldLaxman, MD  carbamazepine (TEGRETOL) 200 MG tablet Take 0.5 tablets (100 mg total) by mouth 2 (two) times daily. 11/26/18   Mancel BaleWentz, Elliott, MD  furosemide (LASIX) 40 MG tablet Take 1 tablet (40 mg total) by mouth 2 (two) times daily. 01/01/19 05/31/19  Rai, Ripudeep Kirtland BouchardK, MD  glipiZIDE (GLUCOTROL) 5 MG tablet Take 0.5 tablets (2.5 mg total) by mouth 2 (two) times daily before a meal. 01/01/19   Rai, Ripudeep K, MD  Ipratropium-Albuterol (COMBIVENT) 20-100 MCG/ACT AERS respimat Inhale 1 puff into the lungs every 6 (six) hours. 11/30/18   Palumbo, April, MD  lisinopril (ZESTRIL) 10 MG tablet Take 1 tablet (10 mg total) by mouth daily. 11/26/18   Mancel BaleWentz, Elliott, MD  potassium chloride SA (K-DUR) 20 MEQ tablet Take 1 tablet (20 mEq total) by mouth 2 (two) times daily. 11/30/18   Palumbo, April, MD    Family History Family History  Problem Relation Age of Onset  . Hypertension Other   . Diabetes Other     Social History Social History   Tobacco Use  . Smoking status: Current Every Day Smoker    Packs/day: 1.00    Years: 20.00    Pack years: 20.00    Types: Cigarettes  . Smokeless tobacco: Current User  Substance Use Topics  . Alcohol use: Yes    Comment: Daily Drinker   . Drug use: Yes    Frequency: 7.0 times per week    Types: "Crack" cocaine, Cocaine, Marijuana     Allergies   Haldol [haloperidol]   Review of Systems Review of Systems  Constitutional: Negative for fever.  Musculoskeletal: Positive for arthralgias.  Skin: Negative for wound.  Neurological: Negative for numbness.     Physical Exam Updated Vital Signs BP (!) 178/105 (BP Location: Left Arm)   Pulse 96   Temp 98.7 F (37.1 C) (Oral)   Resp 20   SpO2 93%   Physical Exam Vitals signs and nursing note reviewed.  Constitutional:       General: He is not in acute distress.    Appearance: He is well-developed.     Comments: Sleepy but arousable  HENT:     Head: Atraumatic.  Eyes:     Conjunctiva/sclera: Conjunctivae normal.  Neck:     Musculoskeletal: Neck supple.  Musculoskeletal:     Comments: I palpated bilateral lower extremities without any focal point tenderness.  No edema noted to his lower legs.  Pedal pulses palpable.  No signs of injury and no signs of infection noted.  Passive range of motion to knees and ankles.  Skin:    Findings: No rash.      ED Treatments / Results  Labs (all labs ordered are listed, but only abnormal results are displayed) Labs Reviewed - No data to display  EKG None  Radiology No results found.  Procedures Procedures (including critical care time)  Medications Ordered in ED Medications  acetaminophen (  TYLENOL) tablet 650 mg (650 mg Oral Given 01/08/19 [id number])     Initial Impression / Assessment and Plan / ED Course  I have reviewed the triage vital signs and the nursing notes.  Pertinent labs & imaging results that were available during my care of the patient were reviewed by me and considered in my medical decision making (see chart for details).        BP (!) 178/105 (BP Location: Left Arm)   Pulse 96   Temp 98.7 F (37.1 C) (Oral)   Resp 20   SpO2 93%    Final Clinical Impressions(s) / ED Diagnoses   Final diagnoses:  Bilateral leg pain    ED Discharge Orders         Ordered    acetaminophen (TYLENOL) 500 MG tablet  Every 6 hours PRN     01/08/19 0842         7:46 AM Patient is well-known in the ED here complaining of leg pain right greater than left.  No reproducible pain noted on exam.  He is sleepy but arousable.  History of homelessness.  No signs of injury to the leg, no signs of infection.  Tylenol given.  Otherwise patient is stable for discharge.  He is able to ambulate.   Fayrene Helperran, Naven Giambalvo, PA-C 01/08/19 [id number]    Lorre NickAllen, Anthony, MD  01/13/19 [phone]

## 2019-01-08 NOTE — ED Triage Notes (Signed)
Pt arrived with complaints of bilateral leg pain. Pt denies any injury, no swelling noted, legs warm to touch and pulse intact. Pt has ETOH on board.

## 2019-01-09 ENCOUNTER — Other Ambulatory Visit: Payer: Self-pay

## 2019-01-09 ENCOUNTER — Emergency Department (HOSPITAL_COMMUNITY)
Admission: EM | Admit: 2019-01-09 | Discharge: 2019-01-09 | Disposition: A | Discharge status: Home / Self Care | Payer: Medicaid Other | Attending: Emergency Medicine | Admitting: Emergency Medicine

## 2019-01-09 ENCOUNTER — Encounter (HOSPITAL_COMMUNITY): Payer: Self-pay | Admitting: *Deleted

## 2019-01-09 DIAGNOSIS — I5020 Unspecified systolic (congestive) heart failure: Secondary | ICD-10-CM | POA: Insufficient documentation

## 2019-01-09 DIAGNOSIS — Z7984 Long term (current) use of oral hypoglycemic drugs: Secondary | ICD-10-CM | POA: Insufficient documentation

## 2019-01-09 DIAGNOSIS — I110 Hypertensive heart disease with heart failure: Secondary | ICD-10-CM | POA: Diagnosis not present

## 2019-01-09 DIAGNOSIS — F17210 Nicotine dependence, cigarettes, uncomplicated: Secondary | ICD-10-CM | POA: Diagnosis not present

## 2019-01-09 DIAGNOSIS — R600 Localized edema: Secondary | ICD-10-CM | POA: Insufficient documentation

## 2019-01-09 DIAGNOSIS — Z590 Homelessness: Secondary | ICD-10-CM | POA: Diagnosis not present

## 2019-01-09 DIAGNOSIS — R609 Edema, unspecified: Secondary | ICD-10-CM

## 2019-01-09 DIAGNOSIS — Z79899 Other long term (current) drug therapy: Secondary | ICD-10-CM | POA: Diagnosis not present

## 2019-01-09 DIAGNOSIS — E119 Type 2 diabetes mellitus without complications: Secondary | ICD-10-CM | POA: Diagnosis not present

## 2019-01-09 DIAGNOSIS — R2240 Localized swelling, mass and lump, unspecified lower limb: Secondary | ICD-10-CM | POA: Diagnosis present

## 2019-01-09 MED ORDER — FUROSEMIDE 40 MG PO TABS
40.0000 mg | ORAL_TABLET | Freq: Once | ORAL | Status: AC
  Administered 2019-01-09: 40 mg via ORAL
  Filled 2019-01-09: qty 1

## 2019-01-09 MED ORDER — ACETAMINOPHEN 325 MG PO TABS
650.0000 mg | ORAL_TABLET | Freq: Once | ORAL | Status: AC
  Administered 2019-01-09: 650 mg via ORAL
  Filled 2019-01-09: qty 2

## 2019-01-09 NOTE — ED Triage Notes (Addendum)
Arrived again due to bil leg edema and bites on legs from ? Being out in woods. As he was being taken to lobby he states he is hearing voices and having hallucinations. NO SI or HI

## 2019-01-09 NOTE — ED Provider Notes (Signed)
[facility]-EMERGENCY DEPT Provider Note   CSN: [id number] Arrival date & time: 01/09/19  1634     History   Chief Complaint Chief Complaint  Patient presents with  . Leg Swelling    HPI [name] is a 58 y.o. male.     58 year old male with chronic lower extremity edema and well-known to this department presents with increased lower extremity swelling.  Has had multiple ED visits for same.  Denies any chest pain or chest pressure.  Of note, patient in the room with a gallon of water of which half was gone.  He is also eating Doritos at this time.  He complains of increased weight gain.  Became very combative and upset when asked about his fluid intake.  Is unsure if he has been compliant with his Lasix.     Past Medical History:  Diagnosis Date  . Chronic foot pain   . Cocaine abuse (HCC)   . Diabetes mellitus without complication (HCC)   . Hepatitis C    unsure   . Homelessness   . Hypertension   . Neuropathy   . Polysubstance abuse (HCC)   . Schizophrenia (HCC)   . Sleep apnea   . Systolic and diastolic CHF, chronic [facility](HCC)     Patient Active Problem List   Diagnosis Date Noted  . Acute on chronic systolic CHF (congestive heart failure) (HCC) 01/02/2019  . 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 Acute on chronic systolic heart failure (HCC) 12/26/2018  . Mobitz type II atrioventricular block 12/25/2018  . Acute exacerbation of CHF (congestive heart failure) (HCC) 12/22/2018  . Acute on chronic respiratory failure with hypoxia (HCC) 12/22/2018  . CHF exacerbation (HCC) 12/19/2018  . Acute CHF (congestive heart failure) (HCC) 12/16/2018  . Diabetes mellitus without complication (HCC) 12/11/2018  . Acute on chronic systolic (congestive) heart failure (HCC) 12/11/2018  . Abdominal pain 12/11/2018  . Nausea vomiting and diarrhea 12/11/2018  . Anemia 12/11/2018  .  Evaluation by psychiatric service required   . MDD (major depressive disorder), severe (HCC) 11/25/2018  . Pressure injury of skin 11/08/2018  . Elevated troponin 10/18/2018  . HLD (hyperlipidemia) 10/18/2018  . Anxiety 10/18/2018  . CHF (congestive heart failure), NYHA class II, acute on chronic, combined (HCC) 10/18/2018  . Hypertensive urgency 10/12/2018  . Mild renal insufficiency 10/12/2018  . Cellulitis 10/12/2018  . Penile cellulitis   . Adjustment disorder with mixed disturbance of emotions and conduct   . Sleep apnea 10/03/2018  . Hypokalemia 09/17/2018  . Scrotal swelling 09/17/2018  . Urinary hesitancy   . Constipation   . Acute on chronic combined systolic and diastolic CHF (congestive heart failure) (HCC) 08/25/2018  . Tobacco use 08/18/2018  . Homelessness 08/08/2018  . Smoker 08/08/2018  . Prostate enlargement 03/16/2018  . Aortic atherosclerosis (HCC) 03/16/2018  . 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 Chronic foot pain   . Schizoaffective disorder, bipolar type (HCC) 09/30/2016  . Substance induced mood disorder (HCC) 03/13/2015  . Schizophrenia, paranoid type (HCC) 01/17/2015  . Drug hallucinosis (HCC) 10/08/2014  . Chronic paranoid schizophrenia (HCC) 09/07/2014  . 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 Urinary retention   . Cocaine use disorder, severe, dependence (HCC)   . Essential hypertension 03/28/2013  . Insulin-requiring or dependent type II diabetes mellitus (HCC) 03/15/2013    Past Surgical History:  Procedure Laterality  Date  . MULTIPLE TOOTH EXTRACTIONS          Home Medications    Prior to Admission medications   Medication Sig Start Date End Date Taking? Authorizing Provider  acetaminophen (TYLENOL) 500 MG tablet Take 1 tablet (500 mg total) by mouth every 6 (six) hours as needed. 01/08/19   Domenic Moras, PA-C  amoxicillin-clavulanate (AUGMENTIN) 875-125  MG tablet Take 1 tablet by mouth 2 (two) times daily for 10 days. 01/01/19 01/11/19  Rai, Ripudeep K, MD  ARIPiprazole (ABILIFY) 10 MG tablet Take 1 tablet (10 mg total) by mouth daily. 12/10/18   Pokhrel, Corrie Mckusick, MD  carbamazepine (TEGRETOL) 200 MG tablet Take 0.5 tablets (100 mg total) by mouth 2 (two) times daily. 11/26/18   Daleen Bo, MD  furosemide (LASIX) 40 MG tablet Take 1 tablet (40 mg total) by mouth 2 (two) times daily. 01/01/19 05/31/19  Rai, Ripudeep Raliegh Ip, MD  glipiZIDE (GLUCOTROL) 5 MG tablet Take 0.5 tablets (2.5 mg total) by mouth 2 (two) times daily before a meal. 01/01/19   Rai, Ripudeep K, MD  Ipratropium-Albuterol (COMBIVENT) 20-100 MCG/ACT AERS respimat Inhale 1 puff into the lungs every 6 (six) hours. 11/30/18   Palumbo, April, MD  lisinopril (ZESTRIL) 10 MG tablet Take 1 tablet (10 mg total) by mouth daily. 11/26/18   Daleen Bo, MD  potassium chloride SA (K-DUR) 20 MEQ tablet Take 1 tablet (20 mEq total) by mouth 2 (two) times daily. 11/30/18   Palumbo, April, MD    Family History Family History  Problem Relation Age of Onset  . Hypertension Other   . Diabetes Other     Social History Social History   Tobacco Use  . Smoking status: Current Every Day Smoker    Packs/day: 1.00    Years: 20.00    Pack years: 20.00    Types: Cigarettes  . Smokeless tobacco: Current User  Substance Use Topics  . Alcohol use: Yes    Comment: Daily Drinker   . Drug use: Yes    Frequency: 7.0 times per week    Types: "Crack" cocaine, Cocaine, Marijuana     Allergies   Haldol [haloperidol]   Review of Systems Review of Systems  All other systems reviewed and are negative.    Physical Exam Updated Vital Signs BP (!) 159/106 (BP Location: Left Arm)   Pulse (!) 101   Temp 98.3 F (36.8 C) (Oral)   Resp 16   SpO2 95%   Physical Exam Vitals signs and nursing note reviewed.  Constitutional:      General: He is not in acute distress.    Appearance: Normal appearance. He is  well-developed. He is not toxic-appearing.  HENT:     Head: Normocephalic and atraumatic.  Eyes:     General: Lids are normal.     Conjunctiva/sclera: Conjunctivae normal.     Pupils: Pupils are equal, round, and reactive to light.  Neck:     Musculoskeletal: Normal range of motion and neck supple.     Thyroid: No thyroid mass.     Trachea: No tracheal deviation.  Cardiovascular:     Rate and Rhythm: Normal rate and regular rhythm.     Heart sounds: Normal heart sounds. No murmur. No gallop.   Pulmonary:     Effort: Pulmonary effort is normal. No respiratory distress.     Breath sounds: Normal breath sounds. No stridor. No decreased breath sounds, wheezing, rhonchi or rales.  Abdominal:     General: Bowel sounds  are normal. There is no distension.     Palpations: Abdomen is soft.     Tenderness: There is no abdominal tenderness. There is no rebound.  Musculoskeletal: Normal range of motion.        General: No tenderness.  Lymphadenopathy:     Comments: Chronic lower extremity edema noted 3+.  Skin:    General: Skin is warm and dry.     Findings: No abrasion or rash.  Neurological:     Mental Status: He is alert and oriented to person, place, and time.     GCS: GCS eye subscore is 4. GCS verbal subscore is 5. GCS motor subscore is 6.     Cranial Nerves: No cranial nerve deficit.     Sensory: No sensory deficit.  Psychiatric:        Attention and Perception: Attention normal.        Mood and Affect: Mood is anxious and elated.        Speech: Speech is rapid and pressured.        Behavior: Behavior is agitated and aggressive.      ED Treatments / Results  Labs (all labs ordered are listed, but only abnormal results are displayed) Labs Reviewed - No data to display  EKG None  Radiology No results found.  Procedures Procedures (including critical care time)  Medications Ordered in ED Medications  furosemide (LASIX) tablet 40 mg (has no administration in time  range)  acetaminophen (TYLENOL) tablet 650 mg (has no administration in time range)     Initial Impression / Assessment and Plan / ED Course  I have reviewed the triage vital signs and the nursing notes.  Pertinent labs & imaging results that were available during my care of the patient were reviewed by me and considered in my medical decision making (see chart for details).        Patient given Lasix and Tylenol here.  Encouraged to follow-up with his doctor  Final Clinical Impressions(s) / ED Diagnoses   Final diagnoses:  None    ED Discharge Orders    None       Lorre NickAllen, Shaton Lore, MD 01/09/19 1943

## 2019-01-12 ENCOUNTER — Other Ambulatory Visit: Payer: Self-pay

## 2019-01-12 ENCOUNTER — Emergency Department (HOSPITAL_COMMUNITY)
Admission: EM | Admit: 2019-01-12 | Discharge: 2019-01-12 | Disposition: A | Discharge status: Home / Self Care | Payer: Medicaid Other | Attending: Emergency Medicine | Admitting: Emergency Medicine

## 2019-01-12 ENCOUNTER — Encounter (HOSPITAL_COMMUNITY): Payer: Self-pay | Admitting: Emergency Medicine

## 2019-01-12 DIAGNOSIS — I5042 Chronic combined systolic (congestive) and diastolic (congestive) heart failure: Secondary | ICD-10-CM | POA: Diagnosis not present

## 2019-01-12 DIAGNOSIS — E119 Type 2 diabetes mellitus without complications: Secondary | ICD-10-CM | POA: Diagnosis not present

## 2019-01-12 DIAGNOSIS — Z7984 Long term (current) use of oral hypoglycemic drugs: Secondary | ICD-10-CM | POA: Insufficient documentation

## 2019-01-12 DIAGNOSIS — R600 Localized edema: Secondary | ICD-10-CM | POA: Diagnosis not present

## 2019-01-12 DIAGNOSIS — Z79899 Other long term (current) drug therapy: Secondary | ICD-10-CM | POA: Diagnosis not present

## 2019-01-12 DIAGNOSIS — I110 Hypertensive heart disease with heart failure: Secondary | ICD-10-CM | POA: Diagnosis not present

## 2019-01-12 DIAGNOSIS — R609 Edema, unspecified: Secondary | ICD-10-CM

## 2019-01-12 DIAGNOSIS — F17210 Nicotine dependence, cigarettes, uncomplicated: Secondary | ICD-10-CM | POA: Diagnosis not present

## 2019-01-12 MED ORDER — ACETAMINOPHEN 325 MG PO TABS
650.0000 mg | ORAL_TABLET | Freq: Once | ORAL | Status: AC
  Administered 2019-01-12: 650 mg via ORAL
  Filled 2019-01-12: qty 2

## 2019-01-12 MED ORDER — SODIUM CHLORIDE 0.9% FLUSH: 3.0000 mL | Freq: Once | INTRAVENOUS | Status: DC

## 2019-01-12 MED ORDER — FUROSEMIDE 20 MG PO TABS
40.0000 mg | ORAL_TABLET | Freq: Once | ORAL | Status: AC
  Administered 2019-01-12: 40 mg via ORAL
  Filled 2019-01-12: qty 2

## 2019-01-12 NOTE — ED Triage Notes (Signed)
Per [facility], Pt has swelling and pain to legs. Pt has swelling to abdomen, scrotum and penis. Pt also reports hemoroids. Pt reports pain to abdomen, denies N/V/D VS for EMS 180/90, HR 98, 98% on 3 L [place], 98.8 temporal. CBG 294.

## 2019-01-12 NOTE — ED Provider Notes (Signed)
[facility] EMERGENCY DEPARTMENT Provider Note  CSN: [id number] Arrival date & time: 01/12/19 0132  Chief Complaint(s) Leg Swelling and abdominal distension  HPI [name] is a 58 y.o. male with a history of chronic anasarca well-known to the emergency department who presents to the emergency department with persistent lower extremity edema and abdominal distention.  Patient has had multiple visits for the same.  He is denying any chest pain or shortness of breath.  Patient has been noncompliant with his medication which is typically the case.  Denies any other physical complaints.  HPI  Past Medical History Past Medical History:  Diagnosis Date  . Chronic foot pain   . Cocaine abuse ([place])   . Diabetes mellitus without complication ([place])   . Hepatitis C    unsure   . Homelessness   . Hypertension   . Neuropathy   . Polysubstance abuse ([place])   . Schizophrenia ([place])   . Sleep apnea   . Systolic and diastolic CHF, chronic [facility])    Patient Active Problem List   Diagnosis Date Noted  . Acute on chronic systolic CHF (congestive heart failure) ([place]) 01/02/2019  . Acute on chronic combined systolic (congestive) and diastolic (congestive) heart failure ([place]) 01/02/2019  . Acute on chronic systolic heart failure ([place]) 12/26/2018  . Mobitz type II atrioventricular block 12/25/2018  . Acute exacerbation of CHF (congestive heart failure) ([place]) 12/22/2018  . Acute on chronic respiratory failure with hypoxia ([place]) 12/22/2018  . CHF exacerbation ([place]) 12/19/2018  . Acute CHF (congestive heart failure) ([place]) 12/16/2018  . Diabetes mellitus without complication ([place]) 78/24/2353  . Acute on chronic systolic (congestive) heart failure ([place]) 12/11/2018  . Abdominal pain 12/11/2018  . Nausea vomiting and diarrhea 12/11/2018  . Anemia 12/11/2018  . Evaluation by psychiatric service required   . MDD (major depressive disorder), severe ([place]) 11/25/2018  . Pressure  injury of skin 11/08/2018  . Elevated troponin 10/18/2018  . HLD (hyperlipidemia) 10/18/2018  . Anxiety 10/18/2018  . CHF (congestive heart failure), NYHA class II, acute on chronic, combined ([place]) 10/18/2018  . Hypertensive urgency 10/12/2018  . Mild renal insufficiency 10/12/2018  . Cellulitis 10/12/2018  . Penile cellulitis   . Adjustment disorder with mixed disturbance of emotions and conduct   . Sleep apnea 10/03/2018  . Hypokalemia 09/17/2018  . Scrotal swelling 09/17/2018  . Urinary hesitancy   . Constipation   . Acute on chronic combined systolic and diastolic CHF (congestive heart failure) ([place]) 08/25/2018  . Tobacco use 08/18/2018  . Homelessness 08/08/2018  . Smoker 08/08/2018  . Prostate enlargement 03/16/2018  . Aortic atherosclerosis ([place]) 03/16/2018  . 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 Chronic foot pain   . Schizoaffective disorder, bipolar type ([place]) 09/30/2016  . Substance induced mood disorder ([place]) 03/13/2015  . Schizophrenia, paranoid type ([place]) 01/17/2015  . Drug hallucinosis ([place]) 10/08/2014  . Chronic paranoid schizophrenia ([place]) 09/07/2014  . Substance or medication-induced bipolar and related disorder with onset during intoxication ([place]) 08/10/2014  . Urinary retention   . Cocaine use disorder, severe, dependence ([place])   . Essential hypertension 03/28/2013  . Insulin-requiring or dependent type II diabetes mellitus ([place]) 03/15/2013   Home Medication(s) Prior to Admission medications   Medication Sig Start Date End Date Taking? Authorizing Provider  acetaminophen (TYLENOL) 500 MG tablet Take 1 tablet (500 mg total) by mouth every 6 (six) hours as needed. 01/08/19   Domenic Moras, PA-C  ARIPiprazole (ABILIFY) 10 MG tablet Take  1 tablet (10 mg total) by mouth daily. 12/10/18   Pokhrel, Rebekah ChesterfieldLaxman, MD  carbamazepine (TEGRETOL) 200 MG tablet Take 0.5 tablets (100 mg total) by mouth 2 (two) times daily. 11/26/18   Mancel BaleWentz, Elliott, MD  furosemide (LASIX)  40 MG tablet Take 1 tablet (40 mg total) by mouth 2 (two) times daily. 01/01/19 05/31/19  Rai, Ripudeep Kirtland BouchardK, MD  glipiZIDE (GLUCOTROL) 5 MG tablet Take 0.5 tablets (2.5 mg total) by mouth 2 (two) times daily before a meal. 01/01/19   Rai, Ripudeep K, MD  Ipratropium-Albuterol (COMBIVENT) 20-100 MCG/ACT AERS respimat Inhale 1 puff into the lungs every 6 (six) hours. 11/30/18   Palumbo, April, MD  lisinopril (ZESTRIL) 10 MG tablet Take 1 tablet (10 mg total) by mouth daily. 11/26/18   Mancel BaleWentz, Elliott, MD  potassium chloride SA (K-DUR) 20 MEQ tablet Take 1 tablet (20 mEq total) by mouth 2 (two) times daily. 11/30/18   Palumbo, April, MD                                                                                                                                    Past Surgical History Past Surgical History:  Procedure Laterality Date  . MULTIPLE TOOTH EXTRACTIONS     Family History Family History  Problem Relation Age of Onset  . Hypertension Other   . Diabetes Other     Social History Social History   Tobacco Use  . Smoking status: Current Every Day Smoker    Packs/day: 1.00    Years: 20.00    Pack years: 20.00    Types: Cigarettes  . Smokeless tobacco: Current User  Substance Use Topics  . Alcohol use: Yes    Comment: Daily Drinker   . Drug use: Yes    Frequency: 7.0 times per week    Types: "Crack" cocaine, Cocaine, Marijuana   Allergies Haldol [haloperidol]  Review of Systems Review of Systems All other systems are reviewed and are negative for acute change except as noted in the HPI  Physical Exam Vital Signs  I have reviewed the triage vital signs BP (!) 157/93   Pulse (!) 102   Temp 98.4 F (36.9 C) (Oral)   SpO2 99%   Physical Exam Vitals signs reviewed.  Constitutional:      General: He is not in acute distress.    Appearance: He is well-developed. He is not diaphoretic.  HENT:     Head: Normocephalic and atraumatic.     Nose: Nose normal.  Eyes:     General:  No scleral icterus.       Right eye: No discharge.        Left eye: No discharge.     Conjunctiva/sclera: Conjunctivae normal.     Pupils: Pupils are equal, round, and reactive to light.  Neck:     Musculoskeletal: Normal range of motion and neck supple.  Cardiovascular:     Rate and  Rhythm: Normal rate and regular rhythm.     Heart sounds: No murmur. No friction rub. No gallop.   Pulmonary:     Effort: Pulmonary effort is normal. No respiratory distress.     Breath sounds: Normal breath sounds. No stridor. No rales.  Abdominal:     General: There is no distension.     Palpations: Abdomen is soft.     Tenderness: There is no abdominal tenderness.     Comments: Abdominal wall edema   Genitourinary:    Comments: Edematous perineum Musculoskeletal:        General: No tenderness.     Right lower leg: 2+ Pitting Edema present.     Left lower leg: 2+ Pitting Edema present.  Skin:    General: Skin is warm and dry.     Findings: No erythema or rash.  Neurological:     Mental Status: He is alert and oriented to person, place, and time.     ED Results and Treatments Labs (all labs ordered are listed, but only abnormal results are displayed) Labs Reviewed - No data to display                                                                                                                       EKG  EKG Interpretation  Date/Time:    Ventricular Rate:    PR Interval:    QRS Duration:   QT Interval:    QTC Calculation:   R Axis:     Text Interpretation:        Radiology No results found.  Pertinent labs & imaging results that were available during my care of the patient were reviewed by me and considered in my medical decision making (see chart for details).  Medications Ordered in ED Medications  sodium chloride flush (NS) 0.9 % injection 3 mL (has no administration in time range)  furosemide (LASIX) tablet 40 mg (40 mg Oral Given 01/12/19 0157)  acetaminophen (TYLENOL)  tablet 650 mg (650 mg Oral Given 01/12/19 0157)                                                                                                                                    Procedures Procedures  (including critical care time)  Medical Decision Making / ED Course I have reviewed the nursing notes for this encounter and the patient's prior records (if available in EHR  or on provided paperwork).   [name] was evaluated in Emergency Department on 01/12/2019 for the symptoms described in the history of present illness. He was evaluated in the context of the global COVID-19 pandemic, which necessitated consideration that the patient might be at risk for infection with the SARS-CoV-2 virus that causes COVID-19. Institutional protocols and algorithms that pertain to the evaluation of patients at risk for COVID-19 are in a state of rapid change based on information released by regulatory bodies including the CDC and federal and state organizations. These policies and algorithms were followed during the patient's care in the ED.  Chronic anasarca.  Noncompliance with meds.  No hypoxia or instability.  Provided with Lasix and Tylenol  The patient appears reasonably screened and/or stabilized for discharge and I doubt any other medical condition or other [facility] requiring further screening, evaluation, or treatment in the ED at this time prior to discharge. The patient is safe for discharge with strict return precautions.       Final Clinical Impression(s) / ED Diagnoses Final diagnoses:  Peripheral edema     The patient appears reasonably screened and/or stabilized for discharge and I doubt any other medical condition or other [facility] requiring further screening, evaluation, or treatment in the ED at this time prior to discharge.  Disposition: Discharge  Condition: Good  I have discussed the results, Dx and Tx plan with the patient who expressed understanding and agree(s) with the  plan. Discharge instructions discussed at great length. The patient was given strict return precautions who verbalized understanding of the instructions. No further questions at time of discharge.    ED Discharge Orders    None       This chart was dictated using voice recognition software.  Despite best efforts to proofread,  errors can occur which can change the documentation meaning.   Nira Connardama,  [place], MD 01/12/19 0230

## 2019-01-13 ENCOUNTER — Emergency Department (HOSPITAL_COMMUNITY)
Admission: EM | Admit: 2019-01-13 | Discharge: 2019-01-13 | Disposition: A | Discharge status: Home / Self Care | Payer: Medicaid Other | Attending: Emergency Medicine | Admitting: Emergency Medicine

## 2019-01-13 ENCOUNTER — Encounter (HOSPITAL_COMMUNITY): Payer: Self-pay

## 2019-01-13 ENCOUNTER — Emergency Department (HOSPITAL_COMMUNITY): Payer: Medicaid Other

## 2019-01-13 ENCOUNTER — Other Ambulatory Visit: Payer: Self-pay

## 2019-01-13 DIAGNOSIS — Z79899 Other long term (current) drug therapy: Secondary | ICD-10-CM | POA: Insufficient documentation

## 2019-01-13 DIAGNOSIS — E119 Type 2 diabetes mellitus without complications: Secondary | ICD-10-CM | POA: Diagnosis not present

## 2019-01-13 DIAGNOSIS — R600 Localized edema: Secondary | ICD-10-CM | POA: Diagnosis not present

## 2019-01-13 DIAGNOSIS — E876 Hypokalemia: Secondary | ICD-10-CM | POA: Diagnosis not present

## 2019-01-13 DIAGNOSIS — I110 Hypertensive heart disease with heart failure: Secondary | ICD-10-CM | POA: Insufficient documentation

## 2019-01-13 DIAGNOSIS — F17210 Nicotine dependence, cigarettes, uncomplicated: Secondary | ICD-10-CM | POA: Diagnosis not present

## 2019-01-13 DIAGNOSIS — Z590 Homelessness: Secondary | ICD-10-CM | POA: Insufficient documentation

## 2019-01-13 DIAGNOSIS — I5040 Unspecified combined systolic (congestive) and diastolic (congestive) heart failure: Secondary | ICD-10-CM | POA: Diagnosis not present

## 2019-01-13 DIAGNOSIS — Z7984 Long term (current) use of oral hypoglycemic drugs: Secondary | ICD-10-CM | POA: Diagnosis not present

## 2019-01-13 DIAGNOSIS — R2240 Localized swelling, mass and lump, unspecified lower limb: Secondary | ICD-10-CM | POA: Diagnosis present

## 2019-01-13 DIAGNOSIS — I2510 Atherosclerotic heart disease of native coronary artery without angina pectoris: Secondary | ICD-10-CM | POA: Diagnosis not present

## 2019-01-13 DIAGNOSIS — R609 Edema, unspecified: Secondary | ICD-10-CM

## 2019-01-13 LAB — CBC WITH DIFFERENTIAL/PLATELET
Abs Immature Granulocytes: 0.03 10*3/uL (ref 0.00–0.07)
Basophils Absolute: 0 10*3/uL (ref 0.0–0.1)
Basophils Relative: 1 %
Eosinophils Absolute: 0 10*3/uL (ref 0.0–0.5)
Eosinophils Relative: 0 %
HCT: 29.7 % — ABNORMAL LOW (ref 39.0–52.0)
Hemoglobin: 8.4 g/dL — ABNORMAL LOW (ref 13.0–17.0)
Immature Granulocytes: 0 %
Lymphocytes Relative: 9 %
Lymphs Abs: 0.8 10*3/uL (ref 0.7–4.0)
MCH: 22 pg — ABNORMAL LOW (ref 26.0–34.0)
MCHC: 28.3 g/dL — ABNORMAL LOW (ref 30.0–36.0)
MCV: 78 fL — ABNORMAL LOW (ref 80.0–100.0)
Monocytes Absolute: 0.7 10*3/uL (ref 0.1–1.0)
Monocytes Relative: 9 %
Neutro Abs: 6.9 10*3/uL (ref 1.7–7.7)
Neutrophils Relative %: 81 %
Platelets: 278 10*3/uL (ref 150–400)
RBC: 3.81 MIL/uL — ABNORMAL LOW (ref 4.22–5.81)
RDW: 21 % — ABNORMAL HIGH (ref 11.5–15.5)
WBC: 8.4 10*3/uL (ref 4.0–10.5)
nRBC: 0.4 % — ABNORMAL HIGH (ref 0.0–0.2)

## 2019-01-13 LAB — BASIC METABOLIC PANEL
Anion gap: 10 (ref 5–15)
BUN: 11 mg/dL (ref 6–20)
CO2: 23 mmol/L (ref 22–32)
Calcium: 8.6 mg/dL — ABNORMAL LOW (ref 8.9–10.3)
Chloride: 105 mmol/L (ref 98–111)
Creatinine, Ser: 0.95 mg/dL (ref 0.61–1.24)
GFR calc Af Amer: >60 mL/min (ref >60)
GFR calc non Af Amer: >60 mL/min (ref >60)
Glucose, Bld: 219 mg/dL — ABNORMAL HIGH (ref 70–99)
Potassium: 2.9 mmol/L — ABNORMAL LOW (ref 3.5–5.1)
Sodium: 138 mmol/L (ref 135–145)

## 2019-01-13 MED ORDER — POTASSIUM CHLORIDE CRYS ER 20 MEQ PO TBCR
40.0000 meq | EXTENDED_RELEASE_TABLET | Freq: Once | ORAL | Status: AC
  Administered 2019-01-13: 40 meq via ORAL
  Filled 2019-01-13: qty 2

## 2019-01-13 MED ORDER — ACETAMINOPHEN 325 MG PO TABS
650.0000 mg | ORAL_TABLET | Freq: Once | ORAL | Status: AC
  Administered 2019-01-13: 650 mg via ORAL
  Filled 2019-01-13: qty 2

## 2019-01-13 MED ORDER — FUROSEMIDE 10 MG/ML IJ SOLN
40.0000 mg | Freq: Once | INTRAMUSCULAR | Status: AC
  Administered 2019-01-13: 11:00:00 40 mg via INTRAVENOUS
  Filled 2019-01-13: qty 4

## 2019-01-13 NOTE — ED Notes (Signed)
Pt provided with clean scrubs and bag of food as well as drinks. Pt wheeled out of department.

## 2019-01-13 NOTE — ED Provider Notes (Signed)
[facility]   CSN: [id number] Arrival date & time: 01/13/19  0932     History   Chief Complaint Chief Complaint  Patient presents with  . Groin Swelling    HPI [name] is a 58 y.o. male presenting for evaluation of leg and groin swelling.  Patient states he is having peripheral edema.  This is not new for him, but he states it is been getting worse.  He does not take any medication out of the hospital, as he states he does not have a place to pick it up.  Patient was seen 2 days ago for the same, given Lasix at that time, which he reports improved symptoms.  Patient denies shortness of breath, chest pain, or cough.  He denies difficulty urinating. No fevers, cough, or abd pain.   Additional history obtained from chart review.  Patient seen 95 times in the past 6 months.  Has a history of chronic pain, cocaine abuse, diabetes, homelessness, hypertension, CHF, OSA.  He has been offered resources multiple times, but continues to not to follow-up outpatient or takes his medications.      HPI  Past Medical History:  Diagnosis Date  . Chronic foot pain   . Cocaine abuse (HCC)   . Diabetes mellitus without complication (HCC)   . Hepatitis C    unsure   . Homelessness   . Hypertension   . Neuropathy   . Polysubstance abuse (HCC)   . Schizophrenia (HCC)   . Sleep apnea   . Systolic and diastolic CHF, chronic [facility](HCC)     Patient Active Problem List   Diagnosis Date Noted  . Acute on chronic systolic CHF (congestive heart failure) (HCC) 01/02/2019  . 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 Acute on chronic systolic heart failure (HCC) 12/26/2018  . Mobitz type II atrioventricular block 12/25/2018  . Acute exacerbation of CHF (congestive heart failure) (HCC) 12/22/2018  . Acute on chronic respiratory failure with hypoxia (HCC) 12/22/2018  . CHF  exacerbation (HCC) 12/19/2018  . Acute CHF (congestive heart failure) (HCC) 12/16/2018  . Diabetes mellitus without complication (HCC) 12/11/2018  . Acute on chronic systolic (congestive) heart failure (HCC) 12/11/2018  . Abdominal pain 12/11/2018  . Nausea vomiting and diarrhea 12/11/2018  . Anemia 12/11/2018  . Evaluation by psychiatric service required   . MDD (major depressive disorder), severe (HCC) 11/25/2018  . Pressure injury of skin 11/08/2018  . Elevated troponin 10/18/2018  . HLD (hyperlipidemia) 10/18/2018  . Anxiety 10/18/2018  . CHF (congestive heart failure), NYHA class II, acute on chronic, combined (HCC) 10/18/2018  . Hypertensive urgency 10/12/2018  . Mild renal insufficiency 10/12/2018  . Cellulitis 10/12/2018  . Penile cellulitis   . Adjustment disorder with mixed disturbance of emotions and conduct   . Sleep apnea 10/03/2018  . Hypokalemia 09/17/2018  . Scrotal swelling 09/17/2018  . Urinary hesitancy   . Constipation   . Acute on chronic combined systolic and diastolic CHF (congestive heart failure) (HCC) 08/25/2018  . Tobacco use 08/18/2018  . Homelessness 08/08/2018  . Smoker 08/08/2018  . Prostate enlargement 03/16/2018  . Aortic atherosclerosis (HCC) 03/16/2018  . 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 Chronic foot pain   . Schizoaffective disorder, bipolar type (HCC) 09/30/2016  . Substance induced mood disorder (HCC) 03/13/2015  . Schizophrenia, paranoid type (HCC) 01/17/2015  . Drug hallucinosis (HCC) 10/08/2014  . Chronic paranoid  schizophrenia ([place]) 09/07/2014  . 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 Urinary retention   . Cocaine use disorder, severe, dependence ([place])   . Essential hypertension 03/28/2013  . Insulin-requiring or dependent type II diabetes mellitus ([place]) 03/15/2013    Past Surgical History:  Procedure Laterality Date  . MULTIPLE TOOTH  EXTRACTIONS          Home Medications    Prior to Admission medications   Medication Sig Start Date End Date Taking? Authorizing Provider  acetaminophen (TYLENOL) 500 MG tablet Take 1 tablet (500 mg total) by mouth every 6 (six) hours as needed. 01/08/19   Domenic Moras, PA-C  ARIPiprazole (ABILIFY) 10 MG tablet Take 1 tablet (10 mg total) by mouth daily. Patient not taking: Reported on 01/13/2019 12/10/18   Flora Lipps, MD  carbamazepine (TEGRETOL) 200 MG tablet Take 0.5 tablets (100 mg total) by mouth 2 (two) times daily. 11/26/18   Daleen Bo, MD  furosemide (LASIX) 40 MG tablet Take 1 tablet (40 mg total) by mouth 2 (two) times daily. 01/01/19 05/31/19  Rai, Ripudeep Raliegh Ip, MD  glipiZIDE (GLUCOTROL) 5 MG tablet Take 0.5 tablets (2.5 mg total) by mouth 2 (two) times daily before a meal. 01/01/19   Rai, Ripudeep K, MD  Ipratropium-Albuterol (COMBIVENT) 20-100 MCG/ACT AERS respimat Inhale 1 puff into the lungs every 6 (six) hours. 11/30/18   Palumbo, April, MD  lisinopril (ZESTRIL) 10 MG tablet Take 1 tablet (10 mg total) by mouth daily. 11/26/18   Daleen Bo, MD  potassium chloride SA (K-DUR) 20 MEQ tablet Take 1 tablet (20 mEq total) by mouth 2 (two) times daily. 11/30/18   Palumbo, April, MD    Family History Family History  Problem Relation Age of Onset  . Hypertension Other   . Diabetes Other     Social History Social History   Tobacco Use  . Smoking status: Current Every Day Smoker    Packs/day: 1.00    Years: 20.00    Pack years: 20.00    Types: Cigarettes  . Smokeless tobacco: Current User  Substance Use Topics  . Alcohol use: Yes    Comment: Daily Drinker   . Drug use: Yes    Frequency: 7.0 times per week    Types: "Crack" cocaine, Cocaine, Marijuana     Allergies   Haldol [haloperidol]   Review of Systems Review of Systems  Cardiovascular: Positive for leg swelling.  All other systems reviewed and are negative.    Physical Exam Updated Vital Signs BP  (!) 163/103 (BP Location: Right Arm)   Pulse (!) 101   Temp 99.6 F (37.6 C) (Rectal)   Resp (!) 21   SpO2 93%   Physical Exam Vitals signs and nursing note reviewed.  Constitutional:      General: He is not in acute distress.    Appearance: He is well-developed.     Comments: Appears nontoxic.  Appears at his baseline.  Patient does feel warm to the touch, 99.8 orally with patient's mouth open  HENT:     Head: Normocephalic and atraumatic.  Eyes:     Conjunctiva/sclera: Conjunctivae normal.     Pupils: Pupils are equal, round, and reactive to light.  Neck:     Musculoskeletal: Normal range of motion and neck supple.  Cardiovascular:     Rate and Rhythm: Normal rate and regular rhythm.     Pulses: Normal pulses.  Pulmonary:     Effort: Pulmonary  effort is normal. No respiratory distress.     Breath sounds: Normal breath sounds. No wheezing.     Comments: Clear lung sounds in all fields.  Speaking full sentences.  Falls asleep quickly and starts snoring, consistent with his history of sleep apnea. Abdominal:     Palpations: Abdomen is soft. There is no mass.     Tenderness: There is no abdominal tenderness. There is no guarding or rebound.     Comments: No ttp of the abd  Musculoskeletal: Normal range of motion.     Right lower leg: Edema present.     Left lower leg: Edema present.     Comments: 2+ pitting edema. Chronic skin changes of the legs  Skin:    General: Skin is warm and dry.  Neurological:     Mental Status: He is alert and oriented to person, place, and time.      ED Treatments / Results  Labs (all labs ordered are listed, but only abnormal results are displayed) Labs Reviewed  CBC WITH DIFFERENTIAL/PLATELET - Abnormal; Notable for the following components:      Result Value   RBC 3.81 (*)    Hemoglobin 8.4 (*)    HCT 29.7 (*)    MCV 78.0 (*)    MCH 22.0 (*)    MCHC 28.3 (*)    RDW 21.0 (*)    nRBC 0.4 (*)    All other components within normal  limits  BASIC METABOLIC PANEL - Abnormal; Notable for the following components:   Potassium 2.9 (*)    Glucose, Bld 219 (*)    Calcium 8.6 (*)    All other components within normal limits    EKG None  Radiology Dg Chest Portable 1 View  Result Date: 01/13/2019 CLINICAL DATA:  Shortness of breath.  Possible fluid overload EXAM: PORTABLE CHEST 1 VIEW COMPARISON:  01/02/2019 FINDINGS: Chronic cardiomegaly. There is no edema, consolidation, effusion, or pneumothorax. Mild scarring in the left mid chest. IMPRESSION: No acute finding.  Baseline appearance of the chest. Electronically Signed   By: Marnee SpringJonathon  Watts M.D.   On: 01/13/2019 10:40    Procedures Procedures (including critical care time)  Medications Ordered in ED Medications  acetaminophen (TYLENOL) tablet 650 mg (650 mg Oral Given 01/13/19 1037)  furosemide (LASIX) injection 40 mg (40 mg Intravenous Given 01/13/19 1038)  potassium chloride SA (K-DUR) CR tablet 40 mEq (40 mEq Oral Given 01/13/19 1117)  potassium chloride SA (K-DUR) CR tablet 40 mEq (40 mEq Oral Given 01/13/19 1117)     Initial Impression / Assessment and Plan / ED Course  I have reviewed the triage vital signs and the nursing notes.  Pertinent labs & imaging results that were available during my care of the patient were reviewed by me and considered in my medical decision making (see chart for details).        Patient presenting for evaluation of leg swelling.  Physical exam shows a patient who appears mostly at his baseline.  He does feel warm to the touch, temperature wildly elevated even with an open mouth.  Concern for possible fever.  As such, will obtain labs and x-ray.  Lasix and Tylenol given for swelling and pain.  X-ray viewed interpreted by me, no pneumonia, pneumothorax, or effusion.  Shows baseline cardiomegaly.  Labs without leukocytosis, low suspicion for infection.  Rectal temperature 99.6.  CBC shows chronic anemia at 8.4, unchanged from  baseline.  BMP shows hypokalemia at 2.9, will give 80  mg of potassium orally.  Patient will not take medications on an outpatient basis, so will not provide prescription.  Case discussed with attending, Dr. Jacqulyn BathLong agrees to plan.  Discussed findings with patient.  At this time, patient appears safe for discharge.  Return precautions given.  Patient states he understand agrees with plan.   Final Clinical Impressions(s) / ED Diagnoses   Final diagnoses:  Peripheral edema  Hypokalemia    ED Discharge Orders    None       Alveria ApleyCaccavale, Kayloni Rocco, PA-C 01/13/19 1153    Long, Arlyss RepressJoshua G, MD 01/14/19 [phone]

## 2019-01-13 NOTE — ED Notes (Signed)
X-ray at bedside

## 2019-01-13 NOTE — ED Notes (Signed)
Pt given milk and crackers.

## 2019-01-13 NOTE — ED Notes (Signed)
Sofia PA at bedside.

## 2019-01-13 NOTE — ED Triage Notes (Signed)
Per PTAR: Originally SOB, when EMS arrived pt was yelling and screaming saying "my balls are swollen".   Pt 94% on room air in ED with no difficulty breathing. Pt reportedly went to sleep on the stretcher as soon as EMS picked him up. EMS placed pt 6 L [place] and oxygen was 99%, tried without oxygen and decreased to 89% on room air while the pt was sleeping.  Vitals from EMS are below  CBG 292 HR 110  RR 20 BP 178/102

## 2019-01-13 NOTE — ED Notes (Signed)
Pt states that he did not sleep last night and is sleepy today.

## 2019-01-13 NOTE — ED Notes (Signed)
Security is aware that pt is present in the ED.

## 2019-01-14 ENCOUNTER — Other Ambulatory Visit: Payer: Self-pay

## 2019-01-14 ENCOUNTER — Encounter (HOSPITAL_COMMUNITY): Payer: Self-pay | Admitting: Emergency Medicine

## 2019-01-14 ENCOUNTER — Emergency Department (HOSPITAL_COMMUNITY)
Admission: EM | Admit: 2019-01-14 | Discharge: 2019-01-14 | Disposition: A | Discharge status: Home / Self Care | Payer: Medicaid Other | Source: Home / Self Care | Attending: Emergency Medicine | Admitting: Emergency Medicine

## 2019-01-14 ENCOUNTER — Emergency Department (HOSPITAL_COMMUNITY): Payer: Medicaid Other

## 2019-01-14 ENCOUNTER — Emergency Department (HOSPITAL_COMMUNITY)
Admission: EM | Admit: 2019-01-14 | Discharge: 2019-01-14 | Disposition: A | Discharge status: Home / Self Care | Payer: Medicaid Other | Attending: Emergency Medicine | Admitting: Emergency Medicine

## 2019-01-14 ENCOUNTER — Encounter (HOSPITAL_COMMUNITY): Payer: Self-pay

## 2019-01-14 DIAGNOSIS — R601 Generalized edema: Secondary | ICD-10-CM | POA: Diagnosis not present

## 2019-01-14 DIAGNOSIS — Z8679 Personal history of other diseases of the circulatory system: Secondary | ICD-10-CM

## 2019-01-14 DIAGNOSIS — N5089 Other specified disorders of the male genital organs: Secondary | ICD-10-CM | POA: Insufficient documentation

## 2019-01-14 DIAGNOSIS — R600 Localized edema: Secondary | ICD-10-CM

## 2019-01-14 DIAGNOSIS — F259 Schizoaffective disorder, unspecified: Secondary | ICD-10-CM | POA: Insufficient documentation

## 2019-01-14 DIAGNOSIS — F339 Major depressive disorder, recurrent, unspecified: Secondary | ICD-10-CM | POA: Insufficient documentation

## 2019-01-14 DIAGNOSIS — F17210 Nicotine dependence, cigarettes, uncomplicated: Secondary | ICD-10-CM | POA: Insufficient documentation

## 2019-01-14 DIAGNOSIS — I10 Essential (primary) hypertension: Secondary | ICD-10-CM

## 2019-01-14 DIAGNOSIS — I110 Hypertensive heart disease with heart failure: Secondary | ICD-10-CM | POA: Insufficient documentation

## 2019-01-14 DIAGNOSIS — Z590 Homelessness: Secondary | ICD-10-CM | POA: Insufficient documentation

## 2019-01-14 DIAGNOSIS — F1910 Other psychoactive substance abuse, uncomplicated: Secondary | ICD-10-CM | POA: Insufficient documentation

## 2019-01-14 DIAGNOSIS — I130 Hypertensive heart and chronic kidney disease with heart failure and stage 1 through stage 4 chronic kidney disease, or unspecified chronic kidney disease: Secondary | ICD-10-CM | POA: Diagnosis not present

## 2019-01-14 DIAGNOSIS — I5042 Chronic combined systolic (congestive) and diastolic (congestive) heart failure: Secondary | ICD-10-CM | POA: Diagnosis not present

## 2019-01-14 DIAGNOSIS — R2240 Localized swelling, mass and lump, unspecified lower limb: Secondary | ICD-10-CM | POA: Diagnosis present

## 2019-01-14 DIAGNOSIS — N189 Chronic kidney disease, unspecified: Secondary | ICD-10-CM | POA: Diagnosis not present

## 2019-01-14 DIAGNOSIS — I509 Heart failure, unspecified: Secondary | ICD-10-CM | POA: Insufficient documentation

## 2019-01-14 DIAGNOSIS — E119 Type 2 diabetes mellitus without complications: Secondary | ICD-10-CM | POA: Insufficient documentation

## 2019-01-14 DIAGNOSIS — Z5320 Procedure and treatment not carried out because of patient's decision for unspecified reasons: Secondary | ICD-10-CM | POA: Insufficient documentation

## 2019-01-14 DIAGNOSIS — Z7984 Long term (current) use of oral hypoglycemic drugs: Secondary | ICD-10-CM | POA: Insufficient documentation

## 2019-01-14 DIAGNOSIS — Z79899 Other long term (current) drug therapy: Secondary | ICD-10-CM | POA: Insufficient documentation

## 2019-01-14 LAB — BASIC METABOLIC PANEL
Anion gap: 9 (ref 5–15)
BUN: 14 mg/dL (ref 6–20)
CO2: 24 mmol/L (ref 22–32)
Calcium: 8.6 mg/dL — ABNORMAL LOW (ref 8.9–10.3)
Chloride: 104 mmol/L (ref 98–111)
Creatinine, Ser: 0.95 mg/dL (ref 0.61–1.24)
GFR calc Af Amer: >60 mL/min (ref >60)
GFR calc non Af Amer: >60 mL/min (ref >60)
Glucose, Bld: 335 mg/dL — ABNORMAL HIGH (ref 70–99)
Potassium: 3.5 mmol/L (ref 3.5–5.1)
Sodium: 137 mmol/L (ref 135–145)

## 2019-01-14 LAB — CBC WITH DIFFERENTIAL/PLATELET
Abs Immature Granulocytes: 0.04 10*3/uL (ref 0.00–0.07)
Basophils Absolute: 0 10*3/uL (ref 0.0–0.1)
Basophils Relative: 0 %
Eosinophils Absolute: 0 10*3/uL (ref 0.0–0.5)
Eosinophils Relative: 0 %
HCT: 30.5 % — ABNORMAL LOW (ref 39.0–52.0)
Hemoglobin: 8.7 g/dL — ABNORMAL LOW (ref 13.0–17.0)
Immature Granulocytes: 1 %
Lymphocytes Relative: 9 %
Lymphs Abs: 0.7 10*3/uL (ref 0.7–4.0)
MCH: 22.5 pg — ABNORMAL LOW (ref 26.0–34.0)
MCHC: 28.5 g/dL — ABNORMAL LOW (ref 30.0–36.0)
MCV: 78.8 fL — ABNORMAL LOW (ref 80.0–100.0)
Monocytes Absolute: 0.7 10*3/uL (ref 0.1–1.0)
Monocytes Relative: 9 %
Neutro Abs: 6.3 10*3/uL (ref 1.7–7.7)
Neutrophils Relative %: 81 %
Platelets: 256 10*3/uL (ref 150–400)
RBC: 3.87 MIL/uL — ABNORMAL LOW (ref 4.22–5.81)
RDW: 21.1 % — ABNORMAL HIGH (ref 11.5–15.5)
WBC: 7.8 10*3/uL (ref 4.0–10.5)
nRBC: 0 % (ref 0.0–0.2)

## 2019-01-14 LAB — RAPID URINE DRUG SCREEN, HOSP PERFORMED
Amphetamines: NOT DETECTED
Barbiturates: NOT DETECTED
Benzodiazepines: NOT DETECTED
Cocaine: POSITIVE — AB
Opiates: NOT DETECTED
Tetrahydrocannabinol: NOT DETECTED

## 2019-01-14 LAB — URINALYSIS, ROUTINE W REFLEX MICROSCOPIC
Bilirubin Urine: NEGATIVE
Glucose, UA: >=500 mg/dL — AB
Ketones, ur: NEGATIVE mg/dL
Leukocytes,Ua: NEGATIVE
Nitrite: NEGATIVE
Protein, ur: >=300 mg/dL — AB
Specific Gravity, Urine: 1.018 (ref 1.005–1.030)
pH: 5 (ref 5.0–8.0)

## 2019-01-14 LAB — TROPONIN I (HIGH SENSITIVITY): Troponin I (High Sensitivity): 19 ng/L — ABNORMAL HIGH (ref <18)

## 2019-01-14 LAB — ETHANOL: Alcohol, Ethyl (B): <10 mg/dL (ref <10)

## 2019-01-14 LAB — BRAIN NATRIURETIC PEPTIDE: B Natriuretic Peptide: 1282.4 pg/mL — ABNORMAL HIGH (ref 0.0–100.0)

## 2019-01-14 MED ORDER — FUROSEMIDE 20 MG PO TABS
60.0000 mg | ORAL_TABLET | Freq: Once | ORAL | Status: AC
  Administered 2019-01-14: 60 mg via ORAL
  Filled 2019-01-14: qty 3

## 2019-01-14 MED ORDER — FUROSEMIDE 20 MG PO TABS
40.0000 mg | ORAL_TABLET | Freq: Once | ORAL | Status: AC
  Administered 2019-01-14: 40 mg via ORAL
  Filled 2019-01-14: qty 2

## 2019-01-14 MED ORDER — POTASSIUM CHLORIDE CRYS ER 20 MEQ PO TBCR
20.0000 meq | EXTENDED_RELEASE_TABLET | Freq: Once | ORAL | Status: AC
  Administered 2019-01-14: 20 meq via ORAL
  Filled 2019-01-14: qty 1

## 2019-01-14 NOTE — ED Notes (Signed)
Pt became upset & was begging to be given medical help to EDP Curat[place] because his testicles & legs hurt from being swollen all over, he is hungry, he cant breath & he's homeless (per pt).

## 2019-01-14 NOTE — ED Provider Notes (Signed)
[place] EMERGENCY DEPARTMENT Provider Note   CSN: [id number] Arrival date & time: 01/14/19  1124    History   Chief Complaint Chief Complaint  Patient presents with  . Groin Swelling    HPI [name] is a 58 y.o. male.     Patient here for chronic leg swelling.  States he is homeless.  Has been unable to take his medications.  Was seen here yesterday and given his medications.  He denies any shortness of breath, chest pain.  The history is provided by the patient.  Leg Pain Location:  Leg Leg location:  R leg and L leg Pain details:    Quality:  Dull   Radiates to:  Does not radiate   Pain severity now: swelling.   Onset quality:  Gradual   Timing:  Constant   Progression:  Unchanged Chronicity:  Chronic Associated symptoms: no back pain, no decreased ROM, no fatigue, no fever and no itching     Past Medical History:  Diagnosis Date  . Chronic foot pain   . Cocaine abuse ([place])   . Diabetes mellitus without complication ([place])   . Hepatitis C    unsure   . Homelessness   . Hypertension   . Neuropathy   . Polysubstance abuse ([place])   . Schizophrenia ([place])   . Sleep apnea   . Systolic and diastolic CHF, chronic [facility])     Patient Active Problem List   Diagnosis Date Noted  . Acute on chronic systolic CHF (congestive heart failure) ([place]) 01/02/2019  . Acute on chronic combined systolic (congestive) and diastolic (congestive) heart failure ([place]) 01/02/2019  . Acute on chronic systolic heart failure ([place]) 12/26/2018  . Mobitz type II atrioventricular block 12/25/2018  . Acute exacerbation of CHF (congestive heart failure) ([place]) 12/22/2018  . Acute on chronic respiratory failure with hypoxia ([place]) 12/22/2018  . CHF exacerbation ([place]) 12/19/2018  . Acute CHF (congestive heart failure) ([place]) 12/16/2018  . Diabetes mellitus without complication ([place]) 67/89/3810  . Acute on chronic systolic (congestive) heart failure ([place]) 12/11/2018   . Abdominal pain 12/11/2018  . Nausea vomiting and diarrhea 12/11/2018  . Anemia 12/11/2018  . Evaluation by psychiatric service required   . MDD (major depressive disorder), severe ([place]) 11/25/2018  . Pressure injury of skin 11/08/2018  . Elevated troponin 10/18/2018  . HLD (hyperlipidemia) 10/18/2018  . Anxiety 10/18/2018  . CHF (congestive heart failure), NYHA class II, acute on chronic, combined ([place]) 10/18/2018  . Hypertensive urgency 10/12/2018  . Mild renal insufficiency 10/12/2018  . Cellulitis 10/12/2018  . Penile cellulitis   . Adjustment disorder with mixed disturbance of emotions and conduct   . Sleep apnea 10/03/2018  . Hypokalemia 09/17/2018  . Scrotal swelling 09/17/2018  . Urinary hesitancy   . Constipation   . Acute on chronic combined systolic and diastolic CHF (congestive heart failure) ([place]) 08/25/2018  . Tobacco use 08/18/2018  . Homelessness 08/08/2018  . Smoker 08/08/2018  . Prostate enlargement 03/16/2018  . Aortic atherosclerosis ([place]) 03/16/2018  . 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 Chronic foot pain   . Schizoaffective disorder, bipolar type ([place]) 09/30/2016  . Substance induced mood disorder ([place]) 03/13/2015  . Schizophrenia, paranoid type ([place]) 01/17/2015  . Drug hallucinosis ([place]) 10/08/2014  . Chronic paranoid schizophrenia ([place]) 09/07/2014  . 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 Urinary retention   . Cocaine use disorder, severe, dependence ([place])   .  Essential hypertension 03/28/2013  . 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 MULTIPLE TOOTH EXTRACTIONS          Home Medications    Prior to Admission medications   Medication Sig Start Date End Date Taking? Authorizing Provider  acetaminophen (TYLENOL) 500 MG tablet Take 1 tablet (500 mg total) by mouth every  6 (six) hours as needed. 01/08/19   Fayrene Helperran, [place], PA-C  ARIPiprazole (ABILIFY) 10 MG tablet Take 1 tablet (10 mg total) by mouth daily. Patient not taking: Reported on 01/13/2019 12/10/18   Joycelyn DasPokhrel, Laxman, MD  carbamazepine (TEGRETOL) 200 MG tablet Take 0.5 tablets (100 mg total) by mouth 2 (two) times daily. 11/26/18   Mancel BaleWentz, Elliott, MD  furosemide (LASIX) 40 MG tablet Take 1 tablet (40 mg total) by mouth 2 (two) times daily. 01/01/19 05/31/19  Rai, Ripudeep Kirtland BouchardK, MD  glipiZIDE (GLUCOTROL) 5 MG tablet Take 0.5 tablets (2.5 mg total) by mouth 2 (two) times daily before a meal. 01/01/19   Rai, Ripudeep K, MD  Ipratropium-Albuterol (COMBIVENT) 20-100 MCG/ACT AERS respimat Inhale 1 puff into the lungs every 6 (six) hours. 11/30/18   Palumbo, April, MD  lisinopril (ZESTRIL) 10 MG tablet Take 1 tablet (10 mg total) by mouth daily. 11/26/18   Mancel BaleWentz, Elliott, MD  potassium chloride SA (K-DUR) 20 MEQ tablet Take 1 tablet (20 mEq total) by mouth 2 (two) times daily. 11/30/18   Palumbo, April, MD    Family History Family History  Problem Relation Age of Onset  . Hypertension Other   . Diabetes Other     Social History Social History   Tobacco Use  . Smoking status: Current Every Day Smoker    Packs/day: 1.00    Years: 20.00    Pack years: 20.00    Types: Cigarettes  . Smokeless tobacco: Current User  Substance Use Topics  . Alcohol use: Yes    Comment: Daily Drinker   . Drug use: Yes    Frequency: 7.0 times per week    Types: "Crack" cocaine, Cocaine, Marijuana     Allergies   Haldol [haloperidol]   Review of Systems Review of Systems  Constitutional: Negative for chills, fatigue and fever.  HENT: Negative for ear pain and sore throat.   Eyes: Negative for pain and visual disturbance.  Respiratory: Negative for cough and shortness of breath.   Cardiovascular: Positive for leg swelling. Negative for chest pain and palpitations.  Gastrointestinal: Negative for abdominal pain and vomiting.   Genitourinary: Positive for scrotal swelling. Negative for dysuria and hematuria.  Musculoskeletal: Negative for arthralgias and back pain.  Skin: Negative for color change, itching and rash.  Neurological: Negative for seizures and syncope.  All other systems reviewed and are negative.    Physical Exam Updated Vital Signs  ED Triage Vitals  Enc Vitals Group     BP 01/14/19 1125 (!) 165/99     Pulse Rate 01/14/19 1125 (!) 103     Resp 01/14/19 1126 20     Temp 01/14/19 1126 98.3 F (36.8 C)     Temp Source 01/14/19 1126 Oral     SpO2 01/14/19 1125 95 %     Weight --      Height --      Head Circumference --      Peak Flow --      Pain Score 01/14/19 1127 10     Pain Loc --  Pain Edu? --      Excl. in GC? --     Physical Exam Vitals signs and nursing note reviewed.  Constitutional:      General: He is not in acute distress.    Appearance: He is well-developed. He is not ill-appearing.  HENT:     Head: Normocephalic and atraumatic.     Nose: Nose normal.     Mouth/Throat:     Mouth: Mucous membranes are moist.  Eyes:     Extraocular Movements: Extraocular movements intact.     Conjunctiva/sclera: Conjunctivae normal.     Pupils: Pupils are equal, round, and reactive to light.  Neck:     Musculoskeletal: Neck supple.  Cardiovascular:     Rate and Rhythm: Normal rate and regular rhythm.     Pulses: Normal pulses.     Heart sounds: Normal heart sounds. No murmur.  Pulmonary:     Effort: Pulmonary effort is normal. No respiratory distress.     Breath sounds: Normal breath sounds.  Abdominal:     Palpations: Abdomen is soft.     Tenderness: There is no abdominal tenderness.  Genitourinary:    Comments: Perineum with edema, no crepitus Musculoskeletal:     Right lower leg: Edema present.     Left lower leg: Edema present.  Skin:    General: Skin is warm and dry.  Neurological:     Mental Status: He is alert.      ED Treatments / Results  Labs (all  labs ordered are listed, but only abnormal results are displayed) Labs Reviewed - No data to display  EKG None  Radiology Dg Chest Portable 1 View  Result Date: 01/13/2019 CLINICAL DATA:  Shortness of breath.  Possible fluid overload EXAM: PORTABLE CHEST 1 VIEW COMPARISON:  01/02/2019 FINDINGS: Chronic cardiomegaly. There is no edema, consolidation, effusion, or pneumothorax. Mild scarring in the left mid chest. IMPRESSION: No acute finding.  Baseline appearance of the chest. Electronically Signed   By: Marnee SpringJonathon  Watts M.D.   On: 01/13/2019 10:40    Procedures Procedures (including critical care time)  Medications Ordered in ED Medications  furosemide (LASIX) tablet 40 mg (40 mg Oral Given 01/14/19 1148)  potassium chloride SA (K-DUR) CR tablet 20 mEq (20 mEq Oral Given 01/14/19 1148)     Initial Impression / Assessment and Plan / ED Course  I have reviewed the triage vital signs and the nursing notes.  Pertinent labs & imaging results that were available during my care of the patient were reviewed by me and considered in my medical decision making (see chart for details).     [name] is a 58 year old male with history of heart failure, noncompliance, drug abuse who presents the ED with leg swelling, scrotal swelling.  This is chronic.  Patient evaluated for the same yesterday.  Multiple ED visits in the past for the same.  Patient has normal vitals.  Breathing well on room air.  It appears that patient is mostly here for social reasons.  He states that he just wants a place to sleep and food.  He does not have any pain.  He has edema to his legs and scrotum but no crepitus, no signs to suggest infection.  No fever.  Doubt Fournier's gangrene or any acute infectious process.  This is likely in the setting of chronic volume overload and noncompliance.  Will give patient his dose of Lasix and potassium.  He had lab work about 20 hours ago  that showed mild hypokalemia.  Otherwise  electrolytes and blood counts were normal.  He had a normal chest x-ray.  Does not appear to be any respiratory distress.  Therefore will give additional potassium and Lasix.  Discharged from the ED in good condition.  Recommend follow-up with wellness center.  This chart was dictated using voice recognition software.  Despite best efforts to proofread,  errors can occur which can change the documentation meaning.    Final Clinical Impressions(s) / ED Diagnoses   Final diagnoses:  Anasarca    ED Discharge Orders    None       Virgina NorfolkCuratolo, Michell Giuliano, DO 01/14/19 1157

## 2019-01-14 NOTE — ED Provider Notes (Signed)
Patient is calm and cooperative at this time.  Now that he is rested in the emergency department he has no additional suicidal thoughts.  He is well-known to myself and this department.  He has chronic suicidal ideation.  He is not in acute congestive heart failure.  He reports he does not need information on the homeless shelters.  He knows with a local homeless shelters are.  He is encouraged to follow-up with his primary care doctor.  He is encouraged to follow-up with [facility] in regards to his depression   Jola Schmidt, MD 01/14/19 [phone]

## 2019-01-14 NOTE — Progress Notes (Signed)
CSW received consult for patient without a specific reason. CSW spoke with [facility], RN to discuss patient's current needs. Patient presented to ED for swelling in his testicles. No obvious CSW needs at this time.  Please consult if needs arise.  Madilyn Fireman, MSW, LCSW-A Clinical Social Worker [facility] [phone]

## 2019-01-14 NOTE — ED Notes (Signed)
Pt aware that a urine sample is needed; urinal at bedside 

## 2019-01-14 NOTE — ED Notes (Signed)
Patient Alert and oriented to baseline. Stable and ambulatory to baseline. Patient verbalized understanding of the discharge instructions.  Patient belongings were taken by the patient.   

## 2019-01-14 NOTE — ED Notes (Signed)
Patient is resting comfortably. 

## 2019-01-14 NOTE — ED Triage Notes (Signed)
Pt endorses testicular pain/swelling that is ongoing. Seen here many times for same. Here yesterday. Axox4.

## 2019-01-14 NOTE — ED Triage Notes (Signed)
Pt endorses testicle pain, was seen today and yesterday for same. A&O x4.

## 2019-01-14 NOTE — ED Provider Notes (Signed)
[facility]   CSN: [id number] Arrival date & time: 01/14/19  2042     History   Chief Complaint No chief complaint on file.   HPI [name] is a 58 y.o. male.     Patient c/o scrotal swelling for past several days. Symptoms gradual onset, constant, moderate-sev, persistent. No abrupt or acute worsening tonight. No dysuria. No testicular pain. No abd pain. Denies fever or chills. Hx chronic edema, notes persistent significant edema - no abrupt/acute worsening. +orthopnea - at baseline per pt. No cough, sore throat, or congestion. No fever or chills. Denies known covid+ exposure. Denies chest pain or discomfort.   The history is provided by the patient.    Past Medical History:  Diagnosis Date  . Chronic foot pain   . Cocaine abuse (HCC)   . Diabetes mellitus without complication (HCC)   . Hepatitis C    unsure   . Homelessness   . Hypertension   . Neuropathy   . Polysubstance abuse (HCC)   . Schizophrenia (HCC)   . Sleep apnea   . Systolic and diastolic CHF, chronic [facility](HCC)     Patient Active Problem List   Diagnosis Date Noted  . Acute on chronic systolic CHF (congestive heart failure) (HCC) 01/02/2019  . 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 Acute on chronic systolic heart failure (HCC) 12/26/2018  . Mobitz type II atrioventricular block 12/25/2018  . Acute exacerbation of CHF (congestive heart failure) (HCC) 12/22/2018  . Acute on chronic respiratory failure with hypoxia (HCC) 12/22/2018  . CHF exacerbation (HCC) 12/19/2018  . Acute CHF (congestive heart failure) (HCC) 12/16/2018  . Diabetes mellitus without complication (HCC) 12/11/2018  . Acute on chronic systolic (congestive) heart failure (HCC) 12/11/2018  . Abdominal pain 12/11/2018  . Nausea vomiting and diarrhea 12/11/2018  . Anemia 12/11/2018  . Evaluation by psychiatric  service required   . MDD (major depressive disorder), severe (HCC) 11/25/2018  . Pressure injury of skin 11/08/2018  . Elevated troponin 10/18/2018  . HLD (hyperlipidemia) 10/18/2018  . Anxiety 10/18/2018  . CHF (congestive heart failure), NYHA class II, acute on chronic, combined (HCC) 10/18/2018  . Hypertensive urgency 10/12/2018  . Mild renal insufficiency 10/12/2018  . Cellulitis 10/12/2018  . Penile cellulitis   . Adjustment disorder with mixed disturbance of emotions and conduct   . Sleep apnea 10/03/2018  . Hypokalemia 09/17/2018  . Scrotal swelling 09/17/2018  . Urinary hesitancy   . Constipation   . Acute on chronic combined systolic and diastolic CHF (congestive heart failure) (HCC) 08/25/2018  . Tobacco use 08/18/2018  . Homelessness 08/08/2018  . Smoker 08/08/2018  . Prostate enlargement 03/16/2018  . Aortic atherosclerosis (HCC) 03/16/2018  . 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 Chronic foot pain   . Schizoaffective disorder, bipolar type (HCC) 09/30/2016  . Substance induced mood disorder (HCC) 03/13/2015  . Schizophrenia, paranoid type (HCC) 01/17/2015  . Drug hallucinosis (HCC) 10/08/2014  . Chronic paranoid schizophrenia (HCC) 09/07/2014  . 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 Urinary retention   . Cocaine use disorder, severe, dependence (HCC)   . Essential hypertension 03/28/2013  . 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 MULTIPLE TOOTH EXTRACTIONS          Home Medications  Prior to Admission medications   Medication Sig Start Date End Date Taking? Authorizing Provider  acetaminophen (TYLENOL) 500 MG tablet Take 1 tablet (500 mg total) by mouth every 6 (six) hours as needed. 01/08/19   Domenic Moras, PA-C  ARIPiprazole (ABILIFY) 10 MG tablet Take 1 tablet (10 mg total) by  mouth daily. Patient not taking: Reported on 01/13/2019 12/10/18   Flora Lipps, MD  carbamazepine (TEGRETOL) 200 MG tablet Take 0.5 tablets (100 mg total) by mouth 2 (two) times daily. 11/26/18   Daleen Bo, MD  furosemide (LASIX) 40 MG tablet Take 1 tablet (40 mg total) by mouth 2 (two) times daily. 01/01/19 05/31/19  Rai, Ripudeep Raliegh Ip, MD  glipiZIDE (GLUCOTROL) 5 MG tablet Take 0.5 tablets (2.5 mg total) by mouth 2 (two) times daily before a meal. 01/01/19   Rai, Ripudeep K, MD  Ipratropium-Albuterol (COMBIVENT) 20-100 MCG/ACT AERS respimat Inhale 1 puff into the lungs every 6 (six) hours. 11/30/18   Palumbo, April, MD  lisinopril (ZESTRIL) 10 MG tablet Take 1 tablet (10 mg total) by mouth daily. 11/26/18   Daleen Bo, MD  potassium chloride SA (K-DUR) 20 MEQ tablet Take 1 tablet (20 mEq total) by mouth 2 (two) times daily. 11/30/18   Palumbo, April, MD    Family History Family History  Problem Relation Age of Onset  . Hypertension Other   . Diabetes Other     Social History Social History   Tobacco Use  . Smoking status: Current Every Day Smoker    Packs/day: 1.00    Years: 20.00    Pack years: 20.00    Types: Cigarettes  . Smokeless tobacco: Current User  Substance Use Topics  . Alcohol use: Yes    Comment: Daily Drinker   . Drug use: Yes    Frequency: 7.0 times per week    Types: "Crack" cocaine, Cocaine, Marijuana     Allergies   Haldol [haloperidol]   Review of Systems Review of Systems  Constitutional: Negative for chills and fever.  HENT: Negative for sore throat.   Eyes: Negative for redness.  Respiratory: Negative for cough and shortness of breath.   Cardiovascular: Positive for leg swelling. Negative for chest pain.  Gastrointestinal: Negative for abdominal pain, diarrhea and vomiting.  Genitourinary: Positive for scrotal swelling. Negative for discharge, dysuria, flank pain and testicular pain.  Musculoskeletal: Negative for back pain and neck pain.  Skin:  Negative for rash.  Neurological: Negative for headaches.  Hematological: Does not bruise/bleed easily.  Psychiatric/Behavioral: Negative for confusion.     Physical Exam Updated Vital Signs BP (!) 166/103 (BP Location: Right Arm)   Pulse (!) 109   Temp 98.3 F (36.8 C) (Oral)   Resp 16   SpO2 95%   Physical Exam Vitals signs and nursing note reviewed.  Constitutional:      General: He is not in acute distress.    Appearance: Normal appearance. He is well-developed. He is not toxic-appearing.  HENT:     Head: Atraumatic.     Nose: Nose normal.     Mouth/Throat:     Mouth: Mucous membranes are moist.     Pharynx: Oropharynx is clear.  Eyes:     General: No scleral icterus.    Conjunctiva/sclera: Conjunctivae normal.  Neck:     Musculoskeletal: Normal range of motion and neck supple. No neck rigidity.     Trachea: No tracheal deviation.     Comments: No jvd.  Cardiovascular:     Rate and  Rhythm: Regular rhythm.     Pulses: Normal pulses.     Heart sounds: Normal heart sounds. No murmur. No friction rub. No gallop.   Pulmonary:     Effort: Pulmonary effort is normal. No accessory muscle usage or respiratory distress.     Breath sounds: Normal breath sounds.  Abdominal:     General: Bowel sounds are normal. There is no distension.     Palpations: Abdomen is soft.     Tenderness: There is no abdominal tenderness. There is no guarding.  Genitourinary:    Comments: No cva tenderness. +diffuse scrotal swelling. No crepitus. No cellulitis. No sign of infection.  Musculoskeletal:     Right lower leg: Edema present.     Left lower leg: Edema present.  Skin:    General: Skin is warm and dry.     Findings: No rash.  Neurological:     Mental Status: He is alert.     Comments: Alert, speech clear.   Psychiatric:        Mood and Affect: Mood normal.      ED Treatments / Results  Labs (all labs ordered are listed, but only abnormal results are displayed) Labs Reviewed  - No data to display  EKG None  Radiology Dg Chest Portable 1 View  Result Date: 01/14/2019 CLINICAL DATA:  Edema.  Heart failure. EXAM: PORTABLE CHEST 1 VIEW COMPARISON:  01/13/2019 and multiple previous FINDINGS: Chronic cardiomegaly. Pulmonary venous hypertension with mild interstitial edema. No infiltrate, collapse or effusion. No acute bone finding. Metallic foreign object in the right anterior chest. IMPRESSION: Cardiomegaly, pulmonary venous hypertension and mild interstitial edema. Electronically Signed   By: Paulina FusiMark  Shogry M.D.   On: 01/14/2019 14:30   Dg Chest Portable 1 View  Result Date: 01/13/2019 CLINICAL DATA:  Shortness of breath.  Possible fluid overload EXAM: PORTABLE CHEST 1 VIEW COMPARISON:  01/02/2019 FINDINGS: Chronic cardiomegaly. There is no edema, consolidation, effusion, or pneumothorax. Mild scarring in the left mid chest. IMPRESSION: No acute finding.  Baseline appearance of the chest. Electronically Signed   By: Marnee SpringJonathon  Watts M.D.   On: 01/13/2019 10:40    Procedures Procedures (including critical care time)  Medications Ordered in ED Medications  furosemide (LASIX) tablet 60 mg (has no administration in time range)     Initial Impression / Assessment and Plan / ED Course  I have reviewed the triage vital signs and the nursing notes.  Pertinent labs & imaging results that were available during my care of the patient were reviewed by me and considered in my medical decision making (see chart for details).  Labs.   Reviewed nursing notes and prior charts for additional history.   Patient had full set of labs earlier this afternoon generally c/w baseline. bnp persistently elevated. Given ongoing significant swelling, will give extra dose of his lasix tonight. Lasix 60 mg po.   Pt appears at his baseline, is afebrile. No chest pain or discomfort.   No sign of infection on scrotal/testicle exam.  Pt appears stable for d/c.  rec close pcp f/u -  tomorrow.   Return precautions provided.     Final Clinical Impressions(s) / ED Diagnoses   Final diagnoses:  None    ED Discharge Orders    None       Cathren LaineSteinl, Imberly Troxler, MD 01/14/19 2114

## 2019-01-14 NOTE — Discharge Instructions (Addendum)
It was our pleasure to provide your ER care today - we hope that you feel better.  Follow up with primary care doctor tomorrow for recheck.  Continue your medication, limit salt intake.   Return to ER if worse, new symptoms, fevers, increased trouble breathing, chest pain, other concern.

## 2019-01-14 NOTE — ED Provider Notes (Signed)
[facility]   CSN: [id number] Arrival date & time: 01/14/19  1319    History   Chief Complaint Chief Complaint  Patient presents with   Leg Swelling   Suicidal    HPI Taylor MemosFrederick L [name] is a 58 y.o. male.     Patient with history of heart failure, schizophrenia, polysubstance abuse, homelessness who presents to the ED for concern for heart failure issues.  Patient also saying he suicidal.  Patient was just seen by myself and did not endorse any suicidal homicidal ideation.  However at this time he states that he would kill himself because he cannot stand the swelling in his legs.  He states that he is willing to stay for any work-up and treatment that is necessary.  The history is provided by the patient.  Illness Location:  General Severity:  Mild Onset quality:  Gradual Timing:  Constant Progression:  Worsening Chronicity:  Chronic Associated symptoms: shortness of breath   Associated symptoms: no abdominal pain, no chest pain, no cough, no ear pain, no fever, no rash, no sore throat and no vomiting     Past Medical History:  Diagnosis Date   Chronic foot pain    Cocaine abuse (HCC)    Diabetes mellitus without complication (HCC)    Hepatitis C    unsure    Homelessness    Hypertension    Neuropathy    Polysubstance abuse (HCC)    Schizophrenia (HCC)    Sleep apnea    Systolic and diastolic CHF, chronic (HCC)     Patient Active Problem List   Diagnosis Date Noted   Acute on chronic systolic CHF (congestive heart failure) (HCC) 01/02/2019   Acute on chronic combined systolic (congestive) and diastolic (congestive) heart failure (HCC) 01/02/2019   Acute on chronic systolic heart failure (HCC) 12/26/2018   Mobitz type II atrioventricular block 12/25/2018   Acute exacerbation of CHF (congestive heart failure) (HCC) 12/22/2018   Acute on chronic respiratory failure with hypoxia (HCC)  12/22/2018   CHF exacerbation (HCC) 12/19/2018   Acute CHF (congestive heart failure) (HCC) 12/16/2018   Diabetes mellitus without complication (HCC) 12/11/2018   Acute on chronic systolic (congestive) heart failure (HCC) 12/11/2018   Abdominal pain 12/11/2018   Nausea vomiting and diarrhea 12/11/2018   Anemia 12/11/2018   Evaluation by psychiatric service required    MDD (major depressive disorder), severe (HCC) 11/25/2018   Pressure injury of skin 11/08/2018   Elevated troponin 10/18/2018   HLD (hyperlipidemia) 10/18/2018   Anxiety 10/18/2018   CHF (congestive heart failure), NYHA class II, acute on chronic, combined (HCC) 10/18/2018   Hypertensive urgency 10/12/2018   Mild renal insufficiency 10/12/2018   Cellulitis 10/12/2018   Penile cellulitis    Adjustment disorder with mixed disturbance of emotions and conduct    Sleep apnea 10/03/2018   Hypokalemia 09/17/2018   Scrotal swelling 09/17/2018   Urinary hesitancy    Constipation    Acute on chronic combined systolic and diastolic CHF (congestive heart failure) (HCC) 08/25/2018   Tobacco use 08/18/2018   Homelessness 08/08/2018   Smoker 08/08/2018   Prostate enlargement 03/16/2018   Aortic atherosclerosis (HCC) 03/16/2018   Aneurysm of abdominal aorta (HCC) 03/16/2018   Chronic foot pain    Schizoaffective disorder, bipolar type (HCC) 09/30/2016   Substance induced mood disorder (HCC) 03/13/2015   Schizophrenia, paranoid type (HCC) 01/17/2015   Drug hallucinosis (HCC) 10/08/2014   Chronic paranoid schizophrenia (HCC) 09/07/2014  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/10/2014   Urinary retention    Cocaine use disorder, severe, dependence (HCC)    Essential hypertension 03/28/2013   Insulin-requiring or dependent type II diabetes mellitus (HCC) 03/15/2013    Past Surgical History:  Procedure Laterality Date    MULTIPLE TOOTH EXTRACTIONS          Home Medications    Prior to Admission medications   Medication Sig Start Date End Date Taking? Authorizing Provider  acetaminophen (TYLENOL) 500 MG tablet Take 1 tablet (500 mg total) by mouth every 6 (six) hours as needed. 01/08/19   Fayrene Helperran, [place], PA-C  ARIPiprazole (ABILIFY) 10 MG tablet Take 1 tablet (10 mg total) by mouth daily. Patient not taking: Reported on 01/13/2019 12/10/18   Joycelyn DasPokhrel, Laxman, MD  carbamazepine (TEGRETOL) 200 MG tablet Take 0.5 tablets (100 mg total) by mouth 2 (two) times daily. 11/26/18   Mancel BaleWentz, Elliott, MD  furosemide (LASIX) 40 MG tablet Take 1 tablet (40 mg total) by mouth 2 (two) times daily. 01/01/19 05/31/19  Rai, Ripudeep Kirtland BouchardK, MD  glipiZIDE (GLUCOTROL) 5 MG tablet Take 0.5 tablets (2.5 mg total) by mouth 2 (two) times daily before a meal. 01/01/19   Rai, Ripudeep K, MD  Ipratropium-Albuterol (COMBIVENT) 20-100 MCG/ACT AERS respimat Inhale 1 puff into the lungs every 6 (six) hours. 11/30/18   Palumbo, April, MD  lisinopril (ZESTRIL) 10 MG tablet Take 1 tablet (10 mg total) by mouth daily. 11/26/18   Mancel BaleWentz, Elliott, MD  potassium chloride SA (K-DUR) 20 MEQ tablet Take 1 tablet (20 mEq total) by mouth 2 (two) times daily. 11/30/18   Palumbo, April, MD    Family History Family History  Problem Relation Age of Onset   Hypertension Other    Diabetes Other     Social History Social History   Tobacco Use   Smoking status: Current Every Day Smoker    Packs/day: 1.00    Years: 20.00    Pack years: 20.00    Types: Cigarettes   Smokeless tobacco: Current User  Substance Use Topics   Alcohol use: Yes    Comment: Daily Drinker    Drug use: Yes    Frequency: 7.0 times per week    Types: "Crack" cocaine, Cocaine, Marijuana     Allergies   Haldol [haloperidol]   Review of Systems Review of Systems  Constitutional: Negative for chills and fever.  HENT: Negative for ear pain and sore throat.   Eyes: Negative for pain  and visual disturbance.  Respiratory: Positive for shortness of breath. Negative for cough.   Cardiovascular: Positive for leg swelling. Negative for chest pain and palpitations.  Gastrointestinal: Negative for abdominal pain and vomiting.  Genitourinary: Positive for scrotal swelling. Negative for dysuria and hematuria.  Musculoskeletal: Negative for arthralgias and back pain.  Skin: Negative for color change and rash.  Neurological: Negative for seizures and syncope.  Psychiatric/Behavioral: Positive for suicidal ideas.  All other systems reviewed and are negative.    Physical Exam Updated Vital Signs  ED Triage Vitals [01/14/19 1328]  Enc Vitals Group     BP (!) 162/100     Pulse Rate (!) 101     Resp 17     Temp 97.9 F (36.6 C)     Temp Source Oral     SpO2 94 %     Weight      Height      Head Circumference  Peak Flow      Pain Score      Pain Loc      Pain Edu?      Excl. in [place]?     Physical Exam Vitals signs and nursing note reviewed.  Constitutional:      Appearance: He is well-developed.  HENT:     Head: Normocephalic and atraumatic.     Nose: Nose normal.     Mouth/Throat:     Mouth: Mucous membranes are moist.  Eyes:     Extraocular Movements: Extraocular movements intact.     Conjunctiva/sclera: Conjunctivae normal.     Pupils: Pupils are equal, round, and reactive to light.  Neck:     Musculoskeletal: Normal range of motion and neck supple.  Cardiovascular:     Rate and Rhythm: Normal rate and regular rhythm.     Pulses: Normal pulses.     Heart sounds: Normal heart sounds. No murmur.  Pulmonary:     Effort: Pulmonary effort is normal. No respiratory distress.     Breath sounds: Normal breath sounds.  Abdominal:     General: There is no distension.     Palpations: Abdomen is soft.     Tenderness: There is no abdominal tenderness.  Genitourinary:    Comments: Perineum swelling but no crepitus Musculoskeletal:     Right lower leg: Edema  present.     Left lower leg: Edema present.  Skin:    General: Skin is warm and dry.  Neurological:     General: No focal deficit present.     Mental Status: He is alert.  Psychiatric:        Mood and Affect: Affect is labile.        Thought Content: Thought content includes suicidal ideation.      ED Treatments / Results  Labs (all labs ordered are listed, but only abnormal results are displayed) Labs Reviewed  CBC WITH DIFFERENTIAL/PLATELET - Abnormal; Notable for the following components:      Result Value   RBC 3.87 (*)    Hemoglobin 8.7 (*)    HCT 30.5 (*)    MCV 78.8 (*)    MCH 22.5 (*)    MCHC 28.5 (*)    RDW 21.1 (*)    All other components within normal limits  BASIC METABOLIC PANEL - Abnormal; Notable for the following components:   Glucose, Bld 335 (*)    Calcium 8.6 (*)    All other components within normal limits  BRAIN NATRIURETIC PEPTIDE - Abnormal; Notable for the following components:   B Natriuretic Peptide 1,282.4 (*)    All other components within normal limits  TROPONIN I (HIGH SENSITIVITY) - Abnormal; Notable for the following components:   Troponin I (High Sensitivity) 19 (*)    All other components within normal limits  ETHANOL  RAPID URINE DRUG SCREEN, HOSP PERFORMED  URINALYSIS, ROUTINE W REFLEX MICROSCOPIC    EKG EKG Interpretation  Date/Time:  Thursday January 14 2019 14:13:34 EDT Ventricular Rate:  102 PR Interval:    QRS Duration: 84 QT Interval:  371 QTC Calculation: 484 R Axis:   45 Text Interpretation:  Sinus tachycardia Probable left atrial enlargement Anterior infarct, old Nonspecific T abnormalities, lateral leads Confirmed by Lennice Sites [phone]) on 01/14/2019 2:15:47 PM   Radiology Dg Chest Portable 1 View  Result Date: 01/14/2019 CLINICAL DATA:  Edema.  Heart failure. EXAM: PORTABLE CHEST 1 VIEW COMPARISON:  01/13/2019 and multiple previous FINDINGS: Chronic cardiomegaly. Pulmonary venous  hypertension with mild  interstitial edema. No infiltrate, collapse or effusion. No acute bone finding. Metallic foreign object in the right anterior chest. IMPRESSION: Cardiomegaly, pulmonary venous hypertension and mild interstitial edema. Electronically Signed   By: Paulina FusiMark  Shogry M.D.   On: 01/14/2019 14:30   Dg Chest Portable 1 View  Result Date: 01/13/2019 CLINICAL DATA:  Shortness of breath.  Possible fluid overload EXAM: PORTABLE CHEST 1 VIEW COMPARISON:  01/02/2019 FINDINGS: Chronic cardiomegaly. There is no edema, consolidation, effusion, or pneumothorax. Mild scarring in the left mid chest. IMPRESSION: No acute finding.  Baseline appearance of the chest. Electronically Signed   By: Marnee SpringJonathon  Watts M.D.   On: 01/13/2019 10:40    Procedures Procedures (including critical care time)  Medications Ordered in ED Medications - No data to display   Initial Impression / Assessment and Plan / ED Course  I have reviewed the triage vital signs and the nursing notes.  Pertinent labs & imaging results that were available during my care of the patient were reviewed by me and considered in my medical decision making (see chart for details).     [name] is a 58 year old male with history of cocaine abuse, diabetes, homelessness, schizophrenia who presents to the ED with leg swelling, heart failure issues, suicidal ideation.  Patient states that he is suicidal because he wants treatment for his heart failure and states that he cannot afford it because he is homeless.  Patient states that he knows that he has not stayed in the past when people are trying to help him and that he is willing to stay today.  He states that he last used cocaine yesterday.  Patient has diffuse swelling from his scrotum down to his ankles on exam.  No crepitus, no fever.  No concern for infectious process.  Patient breathing well on room air.  Patient had lab work yesterday and chest x-ray yesterday that were unremarkable.  I saw the  patient earlier this morning for the same and treated him with a dose of oral Lasix and potassium.  However, now that patient is stating that he is suicidal but no plan, appears vague and will get lab work for medical clearance if needd.  Will decide if he needs medical admission after labs and re-eval if any psych consultation is needed.  Patient is well-known to the department and believe he is mostly here for homelessness and social work needs.  Will contact social work and case management.  Suicidal ideation appears very vague.  Will obtain lab work and reevaluate for the need.  Patient with no significant anemia, leukocytosis, electrolyte abnormality.  Chest x-ray overall with no pneumonia.  Chest x-ray overall unchanged.  Overall patient appears to be at his baseline.  Patient awaiting remaining lab work including BNP, urinalysis, UDS.  Patient was signed out to Dr. Patria Maneampos with patient pending reevaluation and need for any further workup or admission.  Patient has history of leaving AGAINST MEDICAL ADVICE. Patient appears at his baseline.  This chart was dictated using voice recognition software.  Despite best efforts to proofread,  errors can occur which can change the documentation meaning.    Final Clinical Impressions(s) / ED Diagnoses   Final diagnoses:  Bilateral lower extremity edema    ED Discharge Orders    None       Virgina NorfolkCuratolo, Charnae Lill, DO 01/14/19 1517

## 2019-01-14 NOTE — ED Notes (Signed)
Hook patient up to the monitor did ekg shown to er doctor

## 2019-01-14 NOTE — ED Notes (Signed)
Discharge instructions discussed with Pt. Pt verbalized understanding. Pt stable and leaving via [place] with security.

## 2019-01-15 ENCOUNTER — Emergency Department (HOSPITAL_COMMUNITY): Payer: Medicaid Other

## 2019-01-15 ENCOUNTER — Emergency Department (HOSPITAL_COMMUNITY)
Admission: EM | Admit: 2019-01-15 | Discharge: 2019-01-15 | Disposition: A | Discharge status: Home / Self Care | Payer: Medicaid Other | Attending: Emergency Medicine | Admitting: Emergency Medicine

## 2019-01-15 ENCOUNTER — Emergency Department (HOSPITAL_COMMUNITY)
Admission: EM | Admit: 2019-01-15 | Discharge: 2019-01-15 | Disposition: A | Discharge status: Home / Self Care | Payer: Medicaid Other | Source: Home / Self Care | Attending: Emergency Medicine | Admitting: Emergency Medicine

## 2019-01-15 ENCOUNTER — Encounter (HOSPITAL_COMMUNITY): Payer: Self-pay

## 2019-01-15 ENCOUNTER — Other Ambulatory Visit: Payer: Self-pay

## 2019-01-15 DIAGNOSIS — E119 Type 2 diabetes mellitus without complications: Secondary | ICD-10-CM | POA: Insufficient documentation

## 2019-01-15 DIAGNOSIS — F17210 Nicotine dependence, cigarettes, uncomplicated: Secondary | ICD-10-CM | POA: Diagnosis not present

## 2019-01-15 DIAGNOSIS — I110 Hypertensive heart disease with heart failure: Secondary | ICD-10-CM | POA: Insufficient documentation

## 2019-01-15 DIAGNOSIS — Z79899 Other long term (current) drug therapy: Secondary | ICD-10-CM | POA: Insufficient documentation

## 2019-01-15 DIAGNOSIS — Z7984 Long term (current) use of oral hypoglycemic drugs: Secondary | ICD-10-CM | POA: Insufficient documentation

## 2019-01-15 DIAGNOSIS — R2240 Localized swelling, mass and lump, unspecified lower limb: Secondary | ICD-10-CM | POA: Diagnosis present

## 2019-01-15 DIAGNOSIS — Z5900 Homelessness unspecified: Secondary | ICD-10-CM

## 2019-01-15 DIAGNOSIS — R0602 Shortness of breath: Secondary | ICD-10-CM | POA: Insufficient documentation

## 2019-01-15 DIAGNOSIS — I5022 Chronic systolic (congestive) heart failure: Secondary | ICD-10-CM | POA: Diagnosis not present

## 2019-01-15 DIAGNOSIS — I509 Heart failure, unspecified: Secondary | ICD-10-CM | POA: Insufficient documentation

## 2019-01-15 DIAGNOSIS — R609 Edema, unspecified: Secondary | ICD-10-CM

## 2019-01-15 MED ORDER — ACETAMINOPHEN 325 MG PO TABS
325.0000 mg | ORAL_TABLET | Freq: Once | ORAL | Status: AC
  Administered 2019-01-15: 325 mg via ORAL
  Filled 2019-01-15: qty 1

## 2019-01-15 NOTE — ED Triage Notes (Signed)
Pt c/o SOB. Pt was at hospital yesterday for same. Pt states he has not gotten better.

## 2019-01-15 NOTE — ED Provider Notes (Signed)
[facility] EMERGENCY DEPARTMENT Provider Note   CSN: [id number] Arrival date & time: 01/15/19  0447     History   Chief Complaint Chief Complaint  Patient presents with  . Shortness of Breath    HPI [name] is a 58 y.o. male.     HPI  This is a 58 year old male with a history of diabetes, homelessness, hypertension, schizophrenia, substance abuse who presents with lower extremity swelling and shortness of breath.  This is the patient's fourth visit in the last 24 hours.  He had lab work-up and chest x-ray yesterday which were largely at his baseline.  He subsequently reported suicidal ideation related to his homelessness and inability to afford medications.  Patient continues to be homeless.  He states "I cannot get better out on the streets."  Patient very well-known to the emergency department with multiple emergency medicine evaluations and multiple hospital admissions with social work and case management consults.  He has been known to leave against recommendations and is chronically noncompliant.  He also continues to use cocaine.  Patient reports no fevers.  No sick contacts.  Reports swelling in the lower extremities and penis.  He is able to urinate.  Does report some shortness of breath on exertion.  No chest pain.  Past Medical History:  Diagnosis Date  . Chronic foot pain   . Cocaine abuse ([place])   . Diabetes mellitus without complication ([place])   . Hepatitis C    unsure   . Homelessness   . Hypertension   . Neuropathy   . Polysubstance abuse ([place])   . Schizophrenia ([place])   . Sleep apnea   . Systolic and diastolic CHF, chronic [facility])     Patient Active Problem List   Diagnosis Date Noted  . Acute on chronic systolic CHF (congestive heart failure) ([place]) 01/02/2019  . Acute on chronic combined systolic (congestive) and diastolic (congestive) heart failure ([place]) 01/02/2019  . Acute on chronic systolic heart failure ([place]) 12/26/2018  .  Mobitz type II atrioventricular block 12/25/2018  . Acute exacerbation of CHF (congestive heart failure) ([place]) 12/22/2018  . Acute on chronic respiratory failure with hypoxia ([place]) 12/22/2018  . CHF exacerbation ([place]) 12/19/2018  . Acute CHF (congestive heart failure) ([place]) 12/16/2018  . Diabetes mellitus without complication ([place]) 07/09/3233  . Acute on chronic systolic (congestive) heart failure ([place]) 12/11/2018  . Abdominal pain 12/11/2018  . Nausea vomiting and diarrhea 12/11/2018  . Anemia 12/11/2018  . Evaluation by psychiatric service required   . MDD (major depressive disorder), severe ([place]) 11/25/2018  . Pressure injury of skin 11/08/2018  . Elevated troponin 10/18/2018  . HLD (hyperlipidemia) 10/18/2018  . Anxiety 10/18/2018  . CHF (congestive heart failure), NYHA class II, acute on chronic, combined ([place]) 10/18/2018  . Hypertensive urgency 10/12/2018  . Mild renal insufficiency 10/12/2018  . Cellulitis 10/12/2018  . Penile cellulitis   . Adjustment disorder with mixed disturbance of emotions and conduct   . Sleep apnea 10/03/2018  . Hypokalemia 09/17/2018  . Scrotal swelling 09/17/2018  . Urinary hesitancy   . Constipation   . Acute on chronic combined systolic and diastolic CHF (congestive heart failure) ([place]) 08/25/2018  . Tobacco use 08/18/2018  . Homelessness 08/08/2018  . Smoker 08/08/2018  . Prostate enlargement 03/16/2018  . Aortic atherosclerosis ([place]) 03/16/2018  . 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 Chronic foot pain   . Schizoaffective disorder, bipolar type ([place]) 09/30/2016  . Substance induced mood disorder (  HCC) 03/13/2015  . Schizophrenia, paranoid type (HCC) 01/17/2015  . Drug hallucinosis (HCC) 10/08/2014  . Chronic paranoid schizophrenia (HCC) 09/07/2014  . 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 Urinary retention   . Cocaine use disorder, severe, dependence (HCC)    . Essential hypertension 03/28/2013  . 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 MULTIPLE TOOTH EXTRACTIONS          Home Medications    Prior to Admission medications   Medication Sig Start Date End Date Taking? Authorizing Provider  acetaminophen (TYLENOL) 500 MG tablet Take 1 tablet (500 mg total) by mouth every 6 (six) hours as needed. 01/08/19   Fayrene Helperran, [place], PA-C  ARIPiprazole (ABILIFY) 10 MG tablet Take 1 tablet (10 mg total) by mouth daily. Patient not taking: Reported on 01/13/2019 12/10/18   Joycelyn DasPokhrel, Laxman, MD  carbamazepine (TEGRETOL) 200 MG tablet Take 0.5 tablets (100 mg total) by mouth 2 (two) times daily. 11/26/18   Mancel BaleWentz, Elliott, MD  furosemide (LASIX) 40 MG tablet Take 1 tablet (40 mg total) by mouth 2 (two) times daily. 01/01/19 05/31/19  Rai, Ripudeep Kirtland BouchardK, MD  glipiZIDE (GLUCOTROL) 5 MG tablet Take 0.5 tablets (2.5 mg total) by mouth 2 (two) times daily before a meal. 01/01/19   Rai, Ripudeep K, MD  Ipratropium-Albuterol (COMBIVENT) 20-100 MCG/ACT AERS respimat Inhale 1 puff into the lungs every 6 (six) hours. 11/30/18   Palumbo, April, MD  lisinopril (ZESTRIL) 10 MG tablet Take 1 tablet (10 mg total) by mouth daily. 11/26/18   Mancel BaleWentz, Elliott, MD  potassium chloride SA (K-DUR) 20 MEQ tablet Take 1 tablet (20 mEq total) by mouth 2 (two) times daily. 11/30/18   Palumbo, April, MD    Family History Family History  Problem Relation Age of Onset  . Hypertension Other   . Diabetes Other     Social History Social History   Tobacco Use  . Smoking status: Current Every Day Smoker    Packs/day: 1.00    Years: 20.00    Pack years: 20.00    Types: Cigarettes  . Smokeless tobacco: Current User  Substance Use Topics  . Alcohol use: Yes    Comment: Daily Drinker   . Drug use: Yes    Frequency: 7.0 times per week    Types: "Crack" cocaine, Cocaine, Marijuana     Allergies   Haldol  [haloperidol]   Review of Systems Review of Systems  Constitutional: Negative for fever.  Respiratory: Positive for shortness of breath. Negative for cough.   Cardiovascular: Positive for leg swelling. Negative for chest pain.  Gastrointestinal: Negative for abdominal pain, nausea and vomiting.  Genitourinary: Positive for scrotal swelling.  Skin: Negative for wound.  Neurological: Negative for weakness.  All other systems reviewed and are negative.    Physical Exam Updated Vital Signs BP (!) 159/99   Pulse 97   Temp 98.5 F (36.9 C) (Oral)   Resp (!) 28   Ht 1.753 m (5\' 9" )   Wt 104 kg   SpO2 100%   BMI 33.86 kg/m   Physical Exam Vitals signs and nursing note reviewed.  Constitutional:      Appearance: He is well-developed.     Comments: Chronically ill-appearing but nontoxic.  HENT:     Head: Normocephalic and atraumatic.  Eyes:     Pupils: Pupils are equal, round, and reactive to light.  Cardiovascular:     Rate and Rhythm: Normal rate and regular rhythm.     Heart sounds: Normal heart sounds. No murmur.  Pulmonary:     Effort: Pulmonary effort is normal. No tachypnea or respiratory distress.     Breath sounds: Normal breath sounds.     Comments: Fine crackles bilateral bases Abdominal:     General: Bowel sounds are normal.     Palpations: Abdomen is soft.     Tenderness: There is no abdominal tenderness. There is no rebound.  Genitourinary:    Comments: Edematous scrotum, no crepitus, no overlying skin changes Musculoskeletal:     Right lower leg: Edema present.     Left lower leg: Edema present.  Skin:    General: Skin is warm and dry.  Neurological:     Mental Status: He is alert and oriented to person, place, and time.  Psychiatric:        Behavior: Behavior is agitated.      ED Treatments / Results  Labs (all labs ordered are listed, but only abnormal results are displayed) Labs Reviewed - No data to display  EKG None  Radiology Dg  Chest Portable 1 View  Result Date: 01/14/2019 CLINICAL DATA:  Edema.  Heart failure. EXAM: PORTABLE CHEST 1 VIEW COMPARISON:  01/13/2019 and multiple previous FINDINGS: Chronic cardiomegaly. Pulmonary venous hypertension with mild interstitial edema. No infiltrate, collapse or effusion. No acute bone finding. Metallic foreign object in the right anterior chest. IMPRESSION: Cardiomegaly, pulmonary venous hypertension and mild interstitial edema. Electronically Signed   By: Paulina FusiMark  Shogry M.D.   On: 01/14/2019 14:30   Dg Chest Portable 1 View  Result Date: 01/13/2019 CLINICAL DATA:  Shortness of breath.  Possible fluid overload EXAM: PORTABLE CHEST 1 VIEW COMPARISON:  01/02/2019 FINDINGS: Chronic cardiomegaly. There is no edema, consolidation, effusion, or pneumothorax. Mild scarring in the left mid chest. IMPRESSION: No acute finding.  Baseline appearance of the chest. Electronically Signed   By: Marnee SpringJonathon  Watts M.D.   On: 01/13/2019 10:40    Procedures Procedures (including critical care time)  Medications Ordered in ED Medications  acetaminophen (TYLENOL) tablet 325 mg (325 mg Oral Given 01/15/19 0513)     Initial Impression / Assessment and Plan / ED Course  I have reviewed the triage vital signs and the nursing notes.  Pertinent labs & imaging results that were available during my care of the patient were reviewed by me and considered in my medical decision making (see chart for details).        Patient presents with reported shortness of breath and edema.  He is overall nontoxic-appearing.  Baseline agitated.  Vital signs notable for blood pressure 159/99.  He is in no respiratory distress and O2 sats are 100%.  He appears clinically at his baseline.  He does have peripheral edema and scrotal edema which is chronic.  Additionally his pulmonary exam is largely reassuring.  I discussed with him that I would not do any further work-up at this time.  I have reviewed his work-up from  yesterday which included chest x-ray and lab work-up which was largely at the patient's baseline.  Recommend outpatient follow-up.  After history, exam, and medical workup I feel the patient has been appropriately medically screened and is safe for discharge home. Pertinent diagnoses were discussed with the patient. Patient was given return precautions.   Final Clinical Impressions(s) / ED Diagnoses   Final diagnoses:  Peripheral edema    ED Discharge Orders  None       Shon BatonHorton, Persais Ethridge F, MD 01/15/19 [phone]

## 2019-01-15 NOTE — ED Notes (Signed)
Xray tech at bedside.

## 2019-01-15 NOTE — ED Notes (Signed)
Pt states that he became SOB when he walked to the bus stop, during registration pt decided stated that he would also like a sprite and a shower while he is here.

## 2019-01-15 NOTE — ED Triage Notes (Addendum)
Pt arrives with [facility] after being picked up from gas station; pt was found to be urinating and defacating on side of building. Pt reports SHOB and difficulty breathing. Pt also reports using "1 fix of cocaine" this morning.  EMS vitals:  144/96 HR 103 95% O2  On RA CBG 345 97.2 temp

## 2019-01-15 NOTE — ED Provider Notes (Signed)
[facility] Provider Note   CSN: [id number] Arrival date & time: 01/15/19  1503     History   Chief Complaint Chief Complaint  Patient presents with  . Shortness of Breath  . cocaine use    HPI [name] is a 58 y.o. male.     HPI Patient is a 58 year old male with a past medical history of homelessness, diabetes, HTN, schizophrenia, chronic polysubstance abuse who presents to the emergency department today for evaluation of shortness of breath.  Patient was [facility] after he was found urinating and defecating on the side of the building and reported that he used cocaine earlier this morning and now feels short of breath. Of note, this patient was seen in this emergency department earlier today for acute on chronic BLE edema and SOB. He had labs and imaging done earlier in the day on another separate visit, which were noted to be at or around patient's baseline for BNP and CXR findings. He has had multiple admissions for acute on chronic HF exacerbation and frequently leaves the hospital against medical recommendations as he has been repeatedly noncompliant despite efforts by entire care team, including case management and social workers, to assist with getting meds, etc.   He is well-known to the staff of this emergency department, this is his 14th visit since the beginning of July, and his fifth visit in the last 48 hours.    Past Medical History:  Diagnosis Date  . Chronic foot pain   . Cocaine abuse (HCC)   . Diabetes mellitus without complication (HCC)   . Hepatitis C    unsure   . Homelessness   . Hypertension   . Neuropathy   . Polysubstance abuse (HCC)   . Schizophrenia (HCC)   . Sleep apnea   . Systolic and diastolic CHF, chronic [facility](HCC)     Patient Active Problem List   Diagnosis Date Noted  . Acute on chronic systolic CHF (congestive heart failure) (HCC)  01/02/2019  . 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 Acute on chronic systolic heart failure (HCC) 12/26/2018  . Mobitz type II atrioventricular block 12/25/2018  . Acute exacerbation of CHF (congestive heart failure) (HCC) 12/22/2018  . Acute on chronic respiratory failure with hypoxia (HCC) 12/22/2018  . CHF exacerbation (HCC) 12/19/2018  . Acute CHF (congestive heart failure) (HCC) 12/16/2018  . Diabetes mellitus without complication (HCC) 12/11/2018  . Acute on chronic systolic (congestive) heart failure (HCC) 12/11/2018  . Abdominal pain 12/11/2018  . Nausea vomiting and diarrhea 12/11/2018  . Anemia 12/11/2018  . Evaluation by psychiatric service required   . MDD (major depressive disorder), severe (HCC) 11/25/2018  . Pressure injury of skin 11/08/2018  . Elevated troponin 10/18/2018  . HLD (hyperlipidemia) 10/18/2018  . Anxiety 10/18/2018  . CHF (congestive heart failure), NYHA class II, acute on chronic, combined (HCC) 10/18/2018  . Hypertensive urgency 10/12/2018  . Mild renal insufficiency 10/12/2018  . Cellulitis 10/12/2018  . Penile cellulitis   . Adjustment disorder with mixed disturbance of emotions and conduct   . Sleep apnea 10/03/2018  . Hypokalemia 09/17/2018  . Scrotal swelling 09/17/2018  . Urinary hesitancy   . Constipation   . Acute on chronic combined systolic and diastolic CHF (congestive heart failure) (HCC) 08/25/2018  . Tobacco use 08/18/2018  . Homelessness 08/08/2018  . Smoker 08/08/2018  . Prostate  enlargement 03/16/2018  . Aortic atherosclerosis (HCC) 03/16/2018  . 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 Chronic foot pain   . Schizoaffective disorder, bipolar type (HCC) 09/30/2016  . Substance induced mood disorder (HCC) 03/13/2015  . Schizophrenia, paranoid type (HCC) 01/17/2015  . Drug hallucinosis (HCC) 10/08/2014  . Chronic paranoid schizophrenia (HCC) 09/07/2014   . 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 Urinary retention   . Cocaine use disorder, severe, dependence (HCC)   . Essential hypertension 03/28/2013  . 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 MULTIPLE TOOTH EXTRACTIONS          Home Medications    Prior to Admission medications   Medication Sig Start Date End Date Taking? Authorizing Provider  acetaminophen (TYLENOL) 500 MG tablet Take 1 tablet (500 mg total) by mouth every 6 (six) hours as needed. 01/08/19   Fayrene Helperran, [place], PA-C  ARIPiprazole (ABILIFY) 10 MG tablet Take 1 tablet (10 mg total) by mouth daily. Patient not taking: Reported on 01/13/2019 12/10/18   Joycelyn DasPokhrel, Laxman, MD  carbamazepine (TEGRETOL) 200 MG tablet Take 0.5 tablets (100 mg total) by mouth 2 (two) times daily. 11/26/18   Mancel BaleWentz, Elliott, MD  furosemide (LASIX) 40 MG tablet Take 1 tablet (40 mg total) by mouth 2 (two) times daily. 01/01/19 05/31/19  Rai, Ripudeep Kirtland BouchardK, MD  glipiZIDE (GLUCOTROL) 5 MG tablet Take 0.5 tablets (2.5 mg total) by mouth 2 (two) times daily before a meal. 01/01/19   Rai, Ripudeep K, MD  Ipratropium-Albuterol (COMBIVENT) 20-100 MCG/ACT AERS respimat Inhale 1 puff into the lungs every 6 (six) hours. 11/30/18   Palumbo, April, MD  lisinopril (ZESTRIL) 10 MG tablet Take 1 tablet (10 mg total) by mouth daily. 11/26/18   Mancel BaleWentz, Elliott, MD  potassium chloride SA (K-DUR) 20 MEQ tablet Take 1 tablet (20 mEq total) by mouth 2 (two) times daily. 11/30/18   Palumbo, April, MD    Family History Family History  Problem Relation Age of Onset  . Hypertension Other   . Diabetes Other     Social History Social History   Tobacco Use  . Smoking status: Current Every Day Smoker    Packs/day: 1.00    Years: 20.00    Pack years: 20.00    Types: Cigarettes  . Smokeless tobacco: Current User  Substance Use  Topics  . Alcohol use: Yes    Comment: Daily Drinker   . Drug use: Yes    Frequency: 7.0 times per week    Types: "Crack" cocaine, Cocaine, Marijuana     Allergies   Haldol [haloperidol]   Review of Systems Review of Systems  Constitutional: Negative for chills and fever.  HENT: Negative for ear pain and sore throat.   Eyes: Negative for pain and visual disturbance.  Respiratory: Positive for shortness of breath (reported to EMS, but denied to this physician). Negative for cough.   Cardiovascular: Positive for leg swelling. Negative for chest pain and palpitations.  Gastrointestinal: Negative for abdominal pain and vomiting.  Genitourinary: Positive for scrotal swelling. Negative for dysuria and hematuria.  Musculoskeletal: Negative for arthralgias and back pain.  Skin: Negative for color change and rash.  Neurological: Negative for seizures and syncope.  Psychiatric/Behavioral: Positive for agitation and behavioral problems. Negative for suicidal ideas.  All other systems reviewed and are negative.    Physical Exam Updated Vital Signs There were  no vitals taken for this visit.  Physical Exam Vitals signs and nursing note reviewed.  Constitutional:      Appearance: He is well-developed. He is not toxic-appearing.     Comments: Chronically ill-appearing disheveled male in no obvious acute distress   HENT:     Head: Normocephalic and atraumatic.  Eyes:     Conjunctiva/sclera: Conjunctivae normal.  Neck:     Musculoskeletal: Neck supple.  Cardiovascular:     Rate and Rhythm: Normal rate and regular rhythm.     Heart sounds: No murmur.  Pulmonary:     Effort: Pulmonary effort is normal. No respiratory distress.     Comments: Bibasilar crackles are present. Not tachyneic, no increased work or breathing, no accessory muscle use Abdominal:     Palpations: Abdomen is soft.     Tenderness: There is no abdominal tenderness.  Skin:    General: Skin is warm and dry.   Neurological:     Mental Status: He is alert. Mental status is at baseline.     GCS: GCS eye subscore is 4. GCS verbal subscore is 5. GCS motor subscore is 6.     Comments: Cranial nerves grossly intact. Full neurologic examination is limited by patient agitation and non-cooperation to being examined. He has outbursts requesting to rest and that he wants a hot meal      ED Treatments / Results  Labs (all labs ordered are listed, but only abnormal results are displayed) Labs Reviewed - No data to display  EKG EKG Interpretation  Date/Time:  Friday January 15 2019 15:17:21 EDT Ventricular Rate:  105 PR Interval:    QRS Duration: 90 QT Interval:  342 QTC Calculation: 452 R Axis:   67 Text Interpretation:  Sinus tachycardia Probable left atrial enlargement Anterior infarct, old Nonspecific T abnormalities, lateral leads No significant change since last tracing Confirmed by Jacalyn LefevreHaviland, Julie [phone]([id number]) on 01/15/2019 3:28:36 PM Also confirmed by Jacalyn LefevreHaviland, Julie [phone]), editor Barbette Hairassel, Kerry [phone]([id number])  on 01/16/2019 7:06:04 AM   Radiology   Dg Chest Portable 1 View  Result Date: 01/15/2019 CLINICAL DATA:  Low-grade fever which shortness-of-breath. EXAM: PORTABLE CHEST 1 VIEW COMPARISON:  01/14/2019 and 11/13/2018 FINDINGS: Lungs are adequately inflated with linear atelectasis/scarring over the left midlung. No airspace consolidation or effusion. Stable cardiomegaly. Remainder of the exam is unchanged. IMPRESSION: No acute cardiopulmonary disease. Stable cardiomegaly. Linear atelectasis/scarring left midlung. Electronically Signed   By: Elberta Fortisaniel  Boyle M.D.   On: 01/15/2019 15:45    Procedures Procedures (including critical care time)  Medications Ordered in ED Medications - No data to display   Initial Impression / Assessment and Plan / ED Course  I have reviewed the triage vital signs and the nursing notes.  Pertinent labs & imaging results that were available during my care of the patient  were reviewed by me and considered in my medical decision making (see chart for details).   Medical Decision Making:  [name] is a 58 y.o. male with past medical history as described above significant for heart failure, polysubstance abuse, homelessness who presented to the emergency department via EMS after he was picked up outside a gas station following being found urinating and defecating on the walls other abnormal behavior.  He is in no obvious acute distress.  He is hemodynamically stable.  He is not showing any signs of respiratory distress, he was found to resting when I initially entered the room and became agitated that he wanted to rest.  While  awaiting his chest x-ray and EKG, I he became extremely agitated and aggressive towards nursing staff who had brought him a [place] sandwich, saying that he wanted a "hot meal," and yelled at his nurse to get the "F" out of his room.  When he was told that we did not have a "hot meal" for him but that he was welcome to have the [place] sandwich brought to him and something to drink he became more belligerent and demanded his discharge paperwork.  Previous records have been reviewed, including has extensive lab and imaging work-up within the last 24 hours, his overall demeanor, behavior, vital signs and physical examination are consistent with his recent visits, and as stated above he is not in any obvious acute distress.  He is also able to ambulate independently and does not appear to be acutely intoxicated.  No need to help the patient against his will, he is free to make the decision to leave the ED on his own.  He was advised to return to the ED as needed and follow-up with his PCP or the wellness center if needed.  CLINICAL IMPRESSION: 1. Homeless   2. SOB (shortness of breath)      Disposition: Discharge  Key discharge instructions: Strict return precautions provided. Patient was encouraged to return to the ED should they  experience worsening or persistence of current symptoms, or should they develop new concerning symptoms. Encouraged them to f/u with their PCP on an outpatient basis. Questions regarding the diagnosis were answered, and side effects regarding therapies were provided in writing or orally. Patient discharged in stable condition.   The plan for this patient was discussed with my attending physician, Dr. Isla Pence, who voiced agreement and who oversaw evaluation and treatment of this patient.   Andersson Larrabee A. Jimmye Norman, MD Resident Physician, PGY-3 Emergency Medicine [facility] of Medicine    Jefm Petty, MD 01/17/19 1256    Isla Pence, MD 01/17/19 2017

## 2019-01-15 NOTE — Discharge Instructions (Addendum)
You were seen today for ongoing and chronic peripheral edema and shortness of breath.  Take your medications as prescribed specifically your Lasix.  You have previously been given multiple avenues for follow-up.

## 2019-01-15 NOTE — ED Notes (Signed)
Pt was beligierent and yelled at this RN for getting him a [place] sandwich, and not a "hot meal." Pt used vulgar language and yelled me to get out of his room. Pt demanded discharge paperwork; Notified Dr. Gilford Raid. Discharge instructions explained.

## 2019-01-16 ENCOUNTER — Emergency Department (HOSPITAL_COMMUNITY): Payer: Medicare Other

## 2019-01-16 ENCOUNTER — Inpatient Hospital Stay (HOSPITAL_COMMUNITY)
Admission: EM | Admit: 2019-01-16 | Discharge: 2019-01-17 | DRG: 292 | Discharge status: Against Medical Advice | Payer: Medicare Other | Attending: Internal Medicine | Admitting: Internal Medicine

## 2019-01-16 ENCOUNTER — Encounter (HOSPITAL_COMMUNITY): Payer: Self-pay | Admitting: Emergency Medicine

## 2019-01-16 ENCOUNTER — Other Ambulatory Visit: Payer: Self-pay

## 2019-01-16 DIAGNOSIS — Z794 Long term (current) use of insulin: Secondary | ICD-10-CM

## 2019-01-16 DIAGNOSIS — E119 Type 2 diabetes mellitus without complications: Secondary | ICD-10-CM

## 2019-01-16 DIAGNOSIS — F1410 Cocaine abuse, uncomplicated: Secondary | ICD-10-CM

## 2019-01-16 DIAGNOSIS — F1420 Cocaine dependence, uncomplicated: Secondary | ICD-10-CM | POA: Diagnosis present

## 2019-01-16 DIAGNOSIS — F17210 Nicotine dependence, cigarettes, uncomplicated: Secondary | ICD-10-CM | POA: Diagnosis present

## 2019-01-16 DIAGNOSIS — F200 Paranoid schizophrenia: Secondary | ICD-10-CM | POA: Diagnosis present

## 2019-01-16 DIAGNOSIS — D509 Iron deficiency anemia, unspecified: Secondary | ICD-10-CM | POA: Diagnosis present

## 2019-01-16 DIAGNOSIS — Z9119 Patient's noncompliance with other medical treatment and regimen: Secondary | ICD-10-CM

## 2019-01-16 DIAGNOSIS — N5089 Other specified disorders of the male genital organs: Secondary | ICD-10-CM | POA: Diagnosis present

## 2019-01-16 DIAGNOSIS — R079 Chest pain, unspecified: Secondary | ICD-10-CM | POA: Diagnosis present

## 2019-01-16 DIAGNOSIS — I110 Hypertensive heart disease with heart failure: Secondary | ICD-10-CM | POA: Diagnosis not present

## 2019-01-16 DIAGNOSIS — Z91199 Patient's noncompliance with other medical treatment and regimen due to unspecified reason: Secondary | ICD-10-CM | POA: Insufficient documentation

## 2019-01-16 DIAGNOSIS — I5023 Acute on chronic systolic (congestive) heart failure: Secondary | ICD-10-CM

## 2019-01-16 DIAGNOSIS — R7989 Other specified abnormal findings of blood chemistry: Secondary | ICD-10-CM | POA: Diagnosis not present

## 2019-01-16 DIAGNOSIS — I4430 Unspecified atrioventricular block: Secondary | ICD-10-CM | POA: Diagnosis present

## 2019-01-16 DIAGNOSIS — I10 Essential (primary) hypertension: Secondary | ICD-10-CM | POA: Diagnosis present

## 2019-01-16 DIAGNOSIS — R778 Other specified abnormalities of plasma proteins: Secondary | ICD-10-CM | POA: Diagnosis present

## 2019-01-16 DIAGNOSIS — T501X6A Underdosing of loop [high-ceiling] diuretics, initial encounter: Secondary | ICD-10-CM | POA: Diagnosis present

## 2019-01-16 DIAGNOSIS — I5043 Acute on chronic combined systolic (congestive) and diastolic (congestive) heart failure: Secondary | ICD-10-CM | POA: Diagnosis present

## 2019-01-16 DIAGNOSIS — Z91128 Patient's intentional underdosing of medication regimen for other reason: Secondary | ICD-10-CM

## 2019-01-16 DIAGNOSIS — Z20828 Contact with and (suspected) exposure to other viral communicable diseases: Secondary | ICD-10-CM | POA: Diagnosis present

## 2019-01-16 DIAGNOSIS — G4730 Sleep apnea, unspecified: Secondary | ICD-10-CM | POA: Diagnosis present

## 2019-01-16 DIAGNOSIS — Z590 Homelessness: Secondary | ICD-10-CM

## 2019-01-16 LAB — GLUCOSE, CAPILLARY
Glucose-Capillary: 193 mg/dL — ABNORMAL HIGH (ref 70–99)
Glucose-Capillary: 232 mg/dL — ABNORMAL HIGH (ref 70–99)
Glucose-Capillary: 188 mg/dL — ABNORMAL HIGH (ref 70–99)
Glucose-Capillary: 160 mg/dL — ABNORMAL HIGH (ref 70–99)

## 2019-01-16 LAB — CBC
HCT: 30.8 % — ABNORMAL LOW (ref 39.0–52.0)
Hemoglobin: 8.8 g/dL — ABNORMAL LOW (ref 13.0–17.0)
MCH: 22 pg — ABNORMAL LOW (ref 26.0–34.0)
MCHC: 28.6 g/dL — ABNORMAL LOW (ref 30.0–36.0)
MCV: 77 fL — ABNORMAL LOW (ref 80.0–100.0)
Platelets: 325 10*3/uL (ref 150–400)
RBC: 4 MIL/uL — ABNORMAL LOW (ref 4.22–5.81)
RDW: 20.8 % — ABNORMAL HIGH (ref 11.5–15.5)
WBC: 9 10*3/uL (ref 4.0–10.5)
nRBC: 0.4 % — ABNORMAL HIGH (ref 0.0–0.2)

## 2019-01-16 LAB — BASIC METABOLIC PANEL
Anion gap: 7 (ref 5–15)
BUN: 12 mg/dL (ref 6–20)
CO2: 26 mmol/L (ref 22–32)
Calcium: 8.8 mg/dL — ABNORMAL LOW (ref 8.9–10.3)
Chloride: 104 mmol/L (ref 98–111)
Creatinine, Ser: 0.91 mg/dL (ref 0.61–1.24)
GFR calc Af Amer: >60 mL/min (ref >60)
GFR calc non Af Amer: >60 mL/min (ref >60)
Glucose, Bld: 232 mg/dL — ABNORMAL HIGH (ref 70–99)
Potassium: 3.9 mmol/L (ref 3.5–5.1)
Sodium: 137 mmol/L (ref 135–145)

## 2019-01-16 LAB — TROPONIN I (HIGH SENSITIVITY)
Troponin I (High Sensitivity): 39 ng/L — ABNORMAL HIGH (ref <18)
Troponin I (High Sensitivity): 36 ng/L — ABNORMAL HIGH (ref <18)

## 2019-01-16 LAB — BRAIN NATRIURETIC PEPTIDE: B Natriuretic Peptide: 1390.9 pg/mL — ABNORMAL HIGH (ref 0.0–100.0)

## 2019-01-16 LAB — SARS CORONAVIRUS 2 BY RT PCR (HOSPITAL ORDER, PERFORMED IN ~~LOC~~ HOSPITAL LAB): SARS Coronavirus 2: NEGATIVE

## 2019-01-16 MED ORDER — LISINOPRIL 10 MG PO TABS
10.0000 mg | ORAL_TABLET | Freq: Every day | ORAL | Status: DC
  Administered 2019-01-16 – 2019-01-17 (×2): 10 mg via ORAL
  Filled 2019-01-16 (×2): qty 1

## 2019-01-16 MED ORDER — ENOXAPARIN SODIUM 40 MG/0.4ML ~~LOC~~ SOLN
40.0000 mg | SUBCUTANEOUS | Status: DC
  Administered 2019-01-16 – 2019-01-17 (×2): 40 mg via SUBCUTANEOUS
  Filled 2019-01-16 (×2): qty 0.4

## 2019-01-16 MED ORDER — FUROSEMIDE 10 MG/ML IJ SOLN
40.0000 mg | Freq: Once | INTRAMUSCULAR | Status: AC
  Administered 2019-01-16: 40 mg via INTRAVENOUS
  Filled 2019-01-16: qty 4

## 2019-01-16 MED ORDER — HYDRALAZINE HCL 20 MG/ML IJ SOLN: 10.0000 mg | Freq: Four times a day (QID) | INTRAMUSCULAR | Status: DC | PRN

## 2019-01-16 MED ORDER — ONDANSETRON HCL 4 MG/2ML IJ SOLN: 4.0000 mg | Freq: Four times a day (QID) | INTRAMUSCULAR | Status: DC | PRN

## 2019-01-16 MED ORDER — SODIUM CHLORIDE 0.9% FLUSH
3.0000 mL | Freq: Two times a day (BID) | INTRAVENOUS | Status: DC
  Administered 2019-01-16 – 2019-01-17 (×3): 3 mL via INTRAVENOUS

## 2019-01-16 MED ORDER — HYDRALAZINE HCL 50 MG PO TABS
50.0000 mg | ORAL_TABLET | Freq: Two times a day (BID) | ORAL | Status: DC
  Administered 2019-01-16 – 2019-01-17 (×3): 50 mg via ORAL
  Filled 2019-01-16 (×3): qty 1

## 2019-01-16 MED ORDER — ACETAMINOPHEN 325 MG PO TABS: 650.0000 mg | ORAL_TABLET | Freq: Four times a day (QID) | ORAL | Status: DC | PRN

## 2019-01-16 MED ORDER — INSULIN ASPART 100 UNIT/ML ~~LOC~~ SOLN
0.0000 [IU] | Freq: Three times a day (TID) | SUBCUTANEOUS | Status: DC
  Administered 2019-01-16: 3 [IU] via SUBCUTANEOUS
  Administered 2019-01-16: 16:00:00 2 [IU] via SUBCUTANEOUS
  Administered 2019-01-17: 3 [IU] via SUBCUTANEOUS

## 2019-01-16 MED ORDER — INSULIN ASPART 100 UNIT/ML ~~LOC~~ SOLN: 0.0000 [IU] | Freq: Every day | SUBCUTANEOUS | Status: DC

## 2019-01-16 MED ORDER — ARIPIPRAZOLE 5 MG PO TABS
10.0000 mg | ORAL_TABLET | Freq: Every day | ORAL | Status: DC
  Administered 2019-01-16 – 2019-01-17 (×2): 10 mg via ORAL
  Filled 2019-01-16 (×2): qty 2

## 2019-01-16 MED ORDER — GLIPIZIDE 2.5 MG HALF TABLET
2.5000 mg | ORAL_TABLET | Freq: Two times a day (BID) | ORAL | Status: DC
  Administered 2019-01-16 – 2019-01-17 (×2): 2.5 mg via ORAL
  Filled 2019-01-16 (×3): qty 1

## 2019-01-16 MED ORDER — FUROSEMIDE 10 MG/ML IJ SOLN
40.0000 mg | Freq: Two times a day (BID) | INTRAMUSCULAR | Status: DC
  Administered 2019-01-16 – 2019-01-17 (×2): 40 mg via INTRAVENOUS
  Filled 2019-01-16 (×3): qty 4

## 2019-01-16 MED ORDER — SODIUM CHLORIDE 0.9% FLUSH: 3.0000 mL | INTRAVENOUS | Status: DC | PRN

## 2019-01-16 MED ORDER — NITROGLYCERIN 0.4 MG SL SUBL: 0.4000 mg | SUBLINGUAL_TABLET | SUBLINGUAL | Status: DC | PRN

## 2019-01-16 MED ORDER — SODIUM CHLORIDE 0.9 % IV SOLN: 250.0000 mL | INTRAVENOUS | Status: DC | PRN

## 2019-01-16 MED ORDER — ACETAMINOPHEN 325 MG PO TABS
650.0000 mg | ORAL_TABLET | ORAL | Status: DC | PRN
  Administered 2019-01-16 – 2019-01-17 (×4): 650 mg via ORAL
  Filled 2019-01-16 (×4): qty 2

## 2019-01-16 NOTE — H&P (Addendum)
History and Physical    [name] RXV:[id number] DOB: [birth year] DOA: 01/16/2019  PCP: Patient, No Pcp Per   Patient coming from: Homeless   Chief Complaint: Bilateral leg and scrotal pain and swelling   HPI: [name] is a 58 y.o. 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  Patient has been admitted twice already earlier this month, leaving AMA quickly both times, and has been in the ED every day for the past week.  He now comes in complaining of chest pain and worsening swelling and pain throughout both legs and scrotum.  He reports that he was having some fevers, noted to have had a temp of 37.9 C in the ED yesterday.  Last cocaine use was yesterday. He was treated with 324 mg of aspirin prior to arrival in the ED.    ED Course: Upon arrival to the ED, patient is found to be afebrile, saturating well on room air, and with remaining vitals also normal.  EKG features a sinus rhythm with T wave inversions that appear similar to prior.  Chest x-ray notable for cardiomegaly with mild congestion and likely CHF.  Chemistry panel notable for glucose of 232.  CBC features a microcytic anemia with hemoglobin 8.8 and MCV 77.  Troponin is elevated to 39 and BNP is elevated to 1391.  Patient was treated with 40 mg IV Lasix in the ED.  He ambulated in the emergency department but desaturated to the upper 70s and hospitalist consulted for admission.  Review of Systems:  All other systems reviewed and apart from HPI, are negative.  Past Medical History:  Diagnosis Date   Chronic foot pain    Cocaine abuse ([place])    Diabetes mellitus without complication ([place])     Hepatitis C    unsure    Homelessness    Hypertension    Neuropathy    Polysubstance abuse ([place])    Schizophrenia ([place])    Sleep apnea    Systolic and diastolic CHF, chronic ([place])     Past Surgical History:  Procedure Laterality Date   MULTIPLE TOOTH EXTRACTIONS       reports that he has been smoking cigarettes. He has a 20.00 pack-year smoking history. He uses smokeless tobacco. He reports current alcohol use. He reports current drug use. Frequency: 7.00 times per week. Drugs: "Crack" cocaine, Cocaine, and Marijuana.  Allergies  Allergen Reactions   Haldol [Haloperidol] Other (See Comments)    Muscle spasms, loss of voluntary movement. However, pt has taken Thorazine on multiple occasions with no adverse effects.     Family History  Problem Relation Age of Onset   Hypertension Other    Diabetes Other      Prior to Admission medications   Medication Sig Start Date End Date Taking? Authorizing Provider  acetaminophen (TYLENOL) 500 MG tablet Take 1 tablet (500 mg total) by mouth every 6 (six) hours as needed. Patient taking differently: Take 500 mg by mouth every 6 (six) hours as needed for mild pain.  01/08/19  Yes Domenic Moras, PA-C  ARIPiprazole (ABILIFY) 10 MG tablet Take 1 tablet (10 mg total) by mouth daily. Patient not taking: Reported on 01/13/2019 12/10/18   Flora Lipps, MD  carbamazepine (TEGRETOL) 200 MG  tablet Take 0.5 tablets (100 mg total) by mouth 2 (two) times daily. Patient not taking: Reported on 01/15/2019 11/26/18   Mancel BaleWentz, Elliott, MD  furosemide (LASIX) 40 MG tablet Take 1 tablet (40 mg total) by mouth 2 (two) times daily. Patient not taking: Reported on 01/15/2019 01/01/19 05/31/19  Rai, Delene Ruffiniipudeep K, MD  glipiZIDE (GLUCOTROL) 5 MG tablet Take 0.5 tablets (2.5 mg total) by mouth 2 (two) times daily before a meal. Patient not taking: Reported on 01/15/2019 01/01/19   Rai, Delene Ruffiniipudeep K, MD  Ipratropium-Albuterol (COMBIVENT) 20-100 MCG/ACT AERS respimat  Inhale 1 puff into the lungs every 6 (six) hours. Patient not taking: Reported on 01/15/2019 11/30/18   Palumbo, April, MD  lisinopril (ZESTRIL) 10 MG tablet Take 1 tablet (10 mg total) by mouth daily. Patient not taking: Reported on 01/15/2019 11/26/18   Mancel BaleWentz, Elliott, MD  metFORMIN (GLUCOPHAGE) 500 MG tablet Take 500 mg by mouth daily. 01/04/19   [provider]  potassium chloride SA (K-DUR) 20 MEQ tablet Take 1 tablet (20 mEq total) by mouth 2 (two) times daily. Patient not taking: Reported on 01/15/2019 11/30/18   Cy BlamerPalumbo, April, MD    Physical Exam: Vitals:   01/16/19 0350 01/16/19 0400 01/16/19 0521  BP: (!) 154/97 (!) 152/92 (!) 156/97  Pulse: 82    Resp: 20    Temp: 99.3 F (37.4 C)    TempSrc: Oral    SpO2: 96%      Constitutional: NAD, anxious   Eyes: PERTLA, lids and conjunctivae normal ENMT: Mucous membranes are moist. Posterior pharynx clear of any exudate or lesions.   Neck: normal, supple, no masses, no thyromegaly Respiratory:  no wheezing, no crackles. Dyspnea with speech. No accessory muscle use.  Cardiovascular: S1 & S2 heard, regular rate and rhythm. Pitting edema involving bilateral LE's, scrotum, and lower abd. Abdomen: No distension, soft, nontender. Bowel sounds active.  Musculoskeletal: no clubbing / cyanosis. No joint deformity upper and lower extremities.    Skin: no significant rashes, lesions, ulcers. Warm, dry, well-perfused. Neurologic: No facial asymmetry. Sensation intact. Strength 5/5 in all 4 limbs.  Psychiatric: Alert and oriented to person, place, and situation. Anxious.    Labs on Admission: I have personally reviewed following labs and imaging studies  CBC: Recent Labs  Lab 01/13/19 1004 01/14/19 1327 01/16/19 0352  WBC 8.4 7.8 9.0  NEUTROABS 6.9 6.3  --   HGB 8.4* 8.7* 8.8*  HCT 29.7* 30.5* 30.8*  MCV 78.0* 78.8* 77.0*  PLT 278 256 325   Basic Metabolic Panel: Recent Labs  Lab 01/13/19 1004 01/14/19 1327 01/16/19 0352  NA  138 137 137  K 2.9* 3.5 3.9  CL 105 104 104  CO2 23 24 26   GLUCOSE 219* 335* 232*  BUN 11 14 12   CREATININE 0.95 0.95 0.91  CALCIUM 8.6* 8.6* 8.8*   GFR: Estimated Creatinine Clearance: 106.4 mL/min (by C-G formula based on SCr of 0.91 mg/dL). Liver Function Tests: No results for input(s): AST, ALT, ALKPHOS, BILITOT, PROT, ALBUMIN in the last 168 hours. No results for input(s): LIPASE, AMYLASE in the last 168 hours. No results for input(s): AMMONIA in the last 168 hours. Coagulation Profile: No results for input(s): INR, PROTIME in the last 168 hours. Cardiac Enzymes: No results for input(s): CKTOTAL, CKMB, CKMBINDEX, TROPONINI in the last 168 hours. BNP (last 3 results) No results for input(s): PROBNP in the last 8760 hours. HbA1C: No results for input(s): HGBA1C in the last 72 hours. CBG: No results for input(s):  GLUCAP in the last 168 hours. Lipid Profile: No results for input(s): CHOL, HDL, LDLCALC, TRIG, CHOLHDL, LDLDIRECT in the last 72 hours. Thyroid Function Tests: No results for input(s): TSH, T4TOTAL, FREET4, T3FREE, THYROIDAB in the last 72 hours. Anemia Panel: No results for input(s): VITAMINB12, FOLATE, FERRITIN, TIBC, IRON, RETICCTPCT in the last 72 hours. Urine analysis:    Component Value Date/Time   COLORURINE YELLOW 01/14/2019 1500   APPEARANCEUR CLEAR 01/14/2019 1500   LABSPEC 1.018 01/14/2019 1500   PHURINE 5.0 01/14/2019 1500   GLUCOSEU >=500 (A) 01/14/2019 1500   HGBUR SMALL (A) 01/14/2019 1500   BILIRUBINUR NEGATIVE 01/14/2019 1500   KETONESUR NEGATIVE 01/14/2019 1500   PROTEINUR >=300 (A) 01/14/2019 1500   UROBILINOGEN 1.0 03/14/2015 0610   NITRITE NEGATIVE 01/14/2019 1500   LEUKOCYTESUR NEGATIVE 01/14/2019 1500   Sepsis Labs: @LABRCNTIP (procalcitonin:4,lacticidven:4) )No results found for this or any previous visit (from the past 240 hour(s)).   Radiological Exams on Admission: Dg Chest Portable 1 View  Result Date: 01/16/2019 CLINICAL  DATA:  LEFT knee pain and chest pain. EXAM: PORTABLE CHEST 1 VIEW COMPARISON:  Chest x-ray dated 01/15/2019 and 01/14/2019. FINDINGS: Stable cardiomegaly. Mild central pulmonary vascular congestion. No confluent opacity to suggest a developing pneumonia. No pleural effusion or pneumothorax seen. Osseous structures about the chest are unremarkable. IMPRESSION: Cardiomegaly with mild central pulmonary vascular congestion suggesting mild CHF/volume overload. No evidence of pneumonia or overt alveolar pulmonary edema. Electronically Signed   By: Bary RichardStan  Maynard M.D.   On: 01/16/2019 04:20   Dg Chest Portable 1 View  Result Date: 01/15/2019 CLINICAL DATA:  Low-grade fever which shortness-of-breath. EXAM: PORTABLE CHEST 1 VIEW COMPARISON:  01/14/2019 and 11/13/2018 FINDINGS: Lungs are adequately inflated with linear atelectasis/scarring over the left midlung. No airspace consolidation or effusion. Stable cardiomegaly. Remainder of the exam is unchanged. IMPRESSION: No acute cardiopulmonary disease. Stable cardiomegaly. Linear atelectasis/scarring left midlung. Electronically Signed   By: Elberta Fortisaniel  Boyle M.D.   On: 01/15/2019 15:45   Dg Chest Portable 1 View  Result Date: 01/14/2019 CLINICAL DATA:  Edema.  Heart failure. EXAM: PORTABLE CHEST 1 VIEW COMPARISON:  01/13/2019 and multiple previous FINDINGS: Chronic cardiomegaly. Pulmonary venous hypertension with mild interstitial edema. No infiltrate, collapse or effusion. No acute bone finding. Metallic foreign object in the right anterior chest. IMPRESSION: Cardiomegaly, pulmonary venous hypertension and mild interstitial edema. Electronically Signed   By: Paulina FusiMark  Shogry M.D.   On: 01/14/2019 14:30    EKG: Independently reviewed. Sinus rhythm, T-wave inversions are similar to prior.   Assessment/Plan   1. Acute on chronic combined systolic & diastolic CHF  - Presents with progressive bilateral leg and scrotal swelling and pain, noted to have marked pitting edema,  elevated BNP, CXR findings consistent CHF  - He reports fevers and COVID-19 is still on Ddx   - He is stable at rest but desaturated to 70's with ambulation in ED  - EF 25-30% in May 2020   - Treated with Lasix 40 mg IV in ED  - Check COVID-19, maintain infection-control precautions pending results  - Continue diuresis with Lasix 40 mg IV q12h, continue cardiac monitoring, follow daily wt and I/O's   2. Chest pain   - Patient reports some chest pain when asked specifically, but not his primary complaint and he is primarily worried about the leg and scrotal edema  - He was given ASA 324 with EMS pta  - His EKG features T-wave inversions that are similar to prior, initial HS troponin  39, and CXR consistent with CHF  - A second troponin is pending, continue cardiac monitoring, NTG prn   3. Insulin-dependent DM  - A1c was 9.7% in June 2020  - Check CBG's and use Novolog correctional    4. Microcytic anemia  - Hgb is 8.8 on admission with MCV 77.0  - Labs earlier this month consistent with IDA  - Outpatient follow-up recommended, said he has never had colonoscopy    5. Schizophrenia  - Does not take medications - This is likely a major factor in his noncompliance, frequent ED visits, and worsening health  - He may be a good candidate for depot injections if willing    6. Cocaine abuse  - Pt reports last use 01/15/19, not interested in cessation   10. AV block - Had transient high-grade block during recent admission, was seen by cardiology, EP felt not a candidate for intervention, avoidance of AV nodal-blockers recommended    PPE: CAPR, gown, gloves   DVT prophylaxis: Lovenox  Code Status: Full  Family Communication: Discussed with patient  Consults called: none  Admission status: Observation     Briscoe Deutscherimothy S Ansleigh Safer, MD [facility] Pager [phone]  If 7PM-7AM, please contact night-coverage [email or web address] Password [email or web address]  01/16/2019, 5:34 AM

## 2019-01-16 NOTE — ED Notes (Signed)
ED TO INPATIENT HANDOFF REPORT  ED Nurse Name and Phone #: Tray SwazilandJordan, [phone]  S Name/Age/Gender Mellody MemosFrederick L Kubitz 58 y.o. male Room/Bed: 020C/020C  Code Status   Code Status: Prior  Home/SNF/Other Home Patient oriented to: self, place, time and situation Is this baseline? Yes   Triage Complete: Triage complete  Chief Complaint chest pain  Triage Note [place] [facility] [place] for c/o L knee pain and chest pain. Pt reports last cocaine use yesterday. Received 324mg  asa pta. Denies cp at this time. 12 leads unremarkable. Pt a/ox4. resp e/u, nad.    Allergies Allergies  Allergen Reactions  . Haldol [Haloperidol] Other (See Comments)    Muscle spasms, loss of voluntary movement. However, pt has taken Thorazine on multiple occasions with no adverse effects.     Level of Care/Admitting Diagnosis ED Disposition    ED Disposition Condition Comment   Admit  [facility] [[id number]]  Level of Care: Telemetry Cardiac [103]  I expect the patient will be discharged within 24 hours: Yes  LOW acuity---Tx typically complete <24 hrs---ACUTE conditions typically can be evaluated <24 hours---LABS likely to return to acceptable levels <24 hours---IS near functional baseline---EXPECTED to return to current living arrangement---NOT newly hypoxic: Does not meet criteria for 5C-Observation unit  Covid Evaluation: Person Under Investigation (PUI)  Diagnosis: Acute on chronic combined systolic and diastolic CHF (congestive heart failure) [facility](HCC) [[id number]]) [[id number]]  Admitting Physician: Briscoe DeutscherPYD, TIMOTHY S [[id number]]  Attending Physician: Briscoe DeutscherPYD, TIMOTHY S [[id number]]  PT Class (Do Not Modify): Observation [104]  PT Acc Code (Do Not Modify): Observation [10022]       B Medical/Surgery History Past Medical History:  Diagnosis Date  . Chronic foot pain   . Cocaine abuse ([facility])   . Diabetes mellitus without complication ([facility])   . Hepatitis C    unsure   .  Homelessness   . Hypertension   . Neuropathy   . Polysubstance abuse ([facility])   . Schizophrenia ([facility])   . Sleep apnea   . Systolic and diastolic CHF, chronic ([facility])    Past Surgical History:  Procedure Laterality Date  . MULTIPLE TOOTH EXTRACTIONS       A IV Location/Drains/Wounds Patient Lines/Drains/Airways Status   Active Line/Drains/Airways    Name:   Placement date:   Placement time:   Site:   Days:   Peripheral IV 01/16/19 Right Hand   01/16/19    0518    Hand   less than 1   Pressure Injury 11/07/18 Stage II -  Partial thickness loss of dermis presenting as a shallow open ulcer with a red, pink wound bed without slough. small dime sized area   11/07/18    1532     70   Wound / Incision (Open or Dehisced) 01/02/19 Other (Comment) Penis Left;Anterior yellow base, painful   01/02/19    1822    Penis   14          Intake/Output Last 24 hours No intake or output data in the 24 hours ending 01/16/19 [id number]  Labs/Imaging Results for orders placed or performed during the hospital encounter of 01/16/19 (from the past 48 hour(s))  Brain natriuretic peptide     Status: Abnormal   Collection Time: 01/16/19  3:52 AM  Result Value Ref Range   B Natriuretic Peptide 1,390.9 (H) 0.0 - 100.0 pg/mL    Comment: Performed at [facility], [address] [place], [place] [address]  CBC     Status: Abnormal   Collection Time: 01/16/19  3:52 AM  Result Value Ref Range   WBC 9.0 4.0 - 10.5 K/uL   RBC 4.00 (L) 4.22 - 5.81 MIL/uL   Hemoglobin 8.8 (L) 13.0 - 17.0 g/dL    Comment: Reticulocyte Hemoglobin testing may be clinically indicated, consider ordering this additional test [id number]    HCT 30.8 (L) 39.0 - 52.0 %   MCV 77.0 (L) 80.0 - 100.0 fL   MCH 22.0 (L) 26.0 - 34.0 pg   MCHC 28.6 (L) 30.0 - 36.0 g/dL   RDW 72.520.8 (H) 36.611.5 - 44.015.5 %   Platelets 325 150 - 400 K/uL   nRBC 0.4 (H) 0.0 - 0.2 %    Comment: Performed at [facility], [address] [place], [place] [address]   Basic metabolic panel     Status: Abnormal   Collection Time: 01/16/19  3:52 AM  Result Value Ref Range   Sodium 137 135 - 145 mmol/L   Potassium 3.9 3.5 - 5.1 mmol/L   Chloride 104 98 - 111 mmol/L   CO2 26 22 - 32 mmol/L   Glucose, Bld 232 (H) 70 - 99 mg/dL   BUN 12 6 - 20 mg/dL   Creatinine, Ser 5.950.91 0.61 - 1.24 mg/dL   Calcium 8.8 (L) 8.9 - 10.3 mg/dL   GFR calc non Af Amer >60 >60 mL/min   GFR calc Af Amer >60 >60 mL/min   Anion gap 7 5 - 15    Comment: Performed at [facility], [address] [place], [place] [address]  Troponin I (High Sensitivity)     Status: Abnormal   Collection Time: 01/16/19  3:52 AM  Result Value Ref Range   Troponin I (High Sensitivity) 39 (H) <18 ng/L    Comment: RESULT CALLED TO, READ BACK BY AND VERIFIED WITH: Jeanmarie HubertARLAN C,RN 01/16/19 0439 WAYK Performed at [facility], [address] [place], [place] [address]   SARS Coronavirus 2 (CEPHEID - Performed in [facility] lab), Hosp Order     Status: None   Collection Time: 01/16/19  4:50 AM   Specimen: Nasopharyngeal Swab  Result Value Ref Range   SARS Coronavirus 2 NEGATIVE NEGATIVE    Comment: (NOTE) If result is NEGATIVE SARS-CoV-2 target nucleic acids are NOT DETECTED. The SARS-CoV-2 RNA is generally detectable in upper and lower  respiratory specimens during the acute phase of infection. The lowest  concentration of SARS-CoV-2 viral copies this assay can detect is 250  copies / mL. A negative result does not preclude SARS-CoV-2 infection  and should not be used as the sole basis for treatment or other  patient management decisions.  A negative result may occur with  improper specimen collection / handling, submission of specimen other  than nasopharyngeal swab, presence of viral mutation(s) within the  areas targeted by this assay, and inadequate number of viral copies  (<250 copies / mL). A negative result must be combined with clinical  observations, patient history,  and epidemiological information. If result is POSITIVE SARS-CoV-2 target nucleic acids are DETECTED. The SARS-CoV-2 RNA is generally detectable in upper and lower  respiratory specimens dur ing the acute phase of infection.  Positive  results are indicative of active infection with SARS-CoV-2.  Clinical  correlation with patient history and other diagnostic information is  necessary to determine patient infection status.  Positive results do  not rule out bacterial infection or  co-infection with other viruses. If result is PRESUMPTIVE POSTIVE SARS-CoV-2 nucleic acids MAY BE PRESENT.   A presumptive positive result was obtained on the submitted specimen  and confirmed on repeat testing.  While 2019 novel coronavirus  (SARS-CoV-2) nucleic acids may be present in the submitted sample  additional confirmatory testing may be necessary for epidemiological  and / or clinical management purposes  to differentiate between  SARS-CoV-2 and other Sarbecovirus currently known to infect humans.  If clinically indicated additional testing with an alternate test  methodology [phone]([id number]) is advised. The SARS-CoV-2 RNA is generally  detectable in upper and lower respiratory sp ecimens during the acute  phase of infection. The expected result is Negative. Fact Sheet for Patients:  BoilerBrush.com.cyhttps://www.fda.gov/media/136312/download Fact Sheet for Healthcare Providers: https://pope.com/https://www.fda.gov/media/136313/download This test is not yet approved or cleared by the [place] FDA and has been authorized for detection and/or diagnosis of SARS-CoV-2 by FDA under an Emergency Use Authorization (EUA).  This EUA will remain in effect (meaning this test can be used) for the duration of the COVID-19 declaration under Section 564(b)(1) of the Act, 21 U.S.C. section 360bbb-3(b)(1), unless the authorization is terminated or revoked sooner. Performed at [facility], [address] [place], [place] [address]   Troponin  I (High Sensitivity)     Status: Abnormal   Collection Time: 01/16/19  5:59 AM  Result Value Ref Range   Troponin I (High Sensitivity) 36 (H) <18 ng/L    Comment: (NOTE) Elevated high sensitivity troponin I (hsTnI) values and significant  changes across serial measurements may suggest ACS but many other  chronic and acute conditions are known to elevate hsTnI results.  Refer to the "Links" section for chest pain algorithms and additional  guidance. Performed at [facility], [address] [place], [place] [address]    Dg Chest Portable 1 View  Result Date: 01/16/2019 CLINICAL DATA:  LEFT knee pain and chest pain. EXAM: PORTABLE CHEST 1 VIEW COMPARISON:  Chest x-ray dated 01/15/2019 and 01/14/2019. FINDINGS: Stable cardiomegaly. Mild central pulmonary vascular congestion. No confluent opacity to suggest a developing pneumonia. No pleural effusion or pneumothorax seen. Osseous structures about the chest are unremarkable. IMPRESSION: Cardiomegaly with mild central pulmonary vascular congestion suggesting mild CHF/volume overload. No evidence of pneumonia or overt alveolar pulmonary edema. Electronically Signed   By: Bary RichardStan  Maynard M.D.   On: 01/16/2019 04:20   Dg Chest Portable 1 View  Result Date: 01/15/2019 CLINICAL DATA:  Low-grade fever which shortness-of-breath. EXAM: PORTABLE CHEST 1 VIEW COMPARISON:  01/14/2019 and 11/13/2018 FINDINGS: Lungs are adequately inflated with linear atelectasis/scarring over the left midlung. No airspace consolidation or effusion. Stable cardiomegaly. Remainder of the exam is unchanged. IMPRESSION: No acute cardiopulmonary disease. Stable cardiomegaly. Linear atelectasis/scarring left midlung. Electronically Signed   By: Elberta Fortisaniel  Boyle M.D.   On: 01/15/2019 15:45   Dg Chest Portable 1 View  Result Date: 01/14/2019 CLINICAL DATA:  Edema.  Heart failure. EXAM: PORTABLE CHEST 1 VIEW COMPARISON:  01/13/2019 and multiple previous FINDINGS: Chronic  cardiomegaly. Pulmonary venous hypertension with mild interstitial edema. No infiltrate, collapse or effusion. No acute bone finding. Metallic foreign object in the right anterior chest. IMPRESSION: Cardiomegaly, pulmonary venous hypertension and mild interstitial edema. Electronically Signed   By: Paulina FusiMark  Shogry M.D.   On: 01/14/2019 14:30    Pending Labs [employer]Unresulted Labs (From admission, onward)    Start     Ordered   Signed and Interior and spatial designerHeld  Basic metabolic panel  Daily,  R     Signed and Held   Signed and Held  Creatinine, serum  (enoxaparin (LOVENOX)    CrCl >/= 30 ml/min)  Weekly,   R    Comments: while on enoxaparin therapy    Signed and Held          Vitals/Pain Today's Vitals   01/16/19 0515 01/16/19 0521 01/16/19 0545 01/16/19 0615  BP:  (!) 156/97 (!) 155/97   Pulse:      Resp:      Temp:      TempSrc:      SpO2:      PainSc: Asleep   Asleep    Isolation Precautions No active isolations  Medications Medications  acetaminophen (TYLENOL) tablet 650 mg (has no administration in time range)  furosemide (LASIX) injection 40 mg (40 mg Intravenous Given 01/16/19 0518)    Mobility walks with device Moderate fall risk   Focused Assessments Cardiac Assessment Handoff:    Lab Results  Component Value Date   CKTOTAL 245 01/27/2015   CKMB 1.4 06/01/2008   TROPONINI 0.03 (HH) 12/18/2018   Lab Results  Component Value Date   DDIMER  05/31/2008    0.36        AT THE INHOUSE ESTABLISHED CUTOFF VALUE OF 0.48 ug/mL FEU, THIS ASSAY HAS BEEN DOCUMENTED IN THE LITERATURE TO HAVE   Does the Patient currently have chest pain? No     R Recommendations: See Admitting Provider Note  Report given to:   Additional Notes:

## 2019-01-16 NOTE — ED Provider Notes (Signed)
TIME SEEN: 3:55 AM  CHIEF COMPLAINT: Chest pain, lower extremity swelling  HPI: Patient is a 58 year old male with history of homelessness, hypertension, diabetes, schizophrenia, CHF, substance abuse who is well-known to our emergency department who presents today with 1 hour of sharp left-sided chest pain that resolved after receiving 324 mg of aspirin with EMS.  States he is also felt short of breath and has increased swelling in both of his legs, scrotum and abdomen.  He is noncompliant with medications.  He reports using cocaine today.  Was complaining of knee pain but states to me it is actually both legs that are hurting from swelling.  No injury.  He states he thinks he has had a fever.  It appears he did have a low-grade temperature of 100.2 two days ago in the ED.  No cough.  Patient afebrile with EMS.  No hypoxia with EMS.  ROS: See HPI Constitutional:  fever  Eyes: no drainage  ENT: no runny nose   Cardiovascular:   chest pain  Resp:  SOB  GI: no vomiting GU: no dysuria Integumentary: no rash  Allergy: no hives  Musculoskeletal:  leg swelling  Neurological: no slurred speech ROS otherwise negative  PAST MEDICAL HISTORY/PAST SURGICAL HISTORY:  Past Medical History:  Diagnosis Date  . Chronic foot pain   . Cocaine abuse (HCC)   . Diabetes mellitus without complication (HCC)   . Hepatitis C    unsure   . Homelessness   . Hypertension   . Neuropathy   . Polysubstance abuse (HCC)   . Schizophrenia (HCC)   . Sleep apnea   . Systolic and diastolic CHF, chronic (HCC)     MEDICATIONS:  Prior to Admission medications   Medication Sig Start Date End Date Taking? Authorizing Provider  acetaminophen (TYLENOL) 500 MG tablet Take 1 tablet (500 mg total) by mouth every 6 (six) hours as needed. 01/08/19   Fayrene Helperran, [place], PA-C  ARIPiprazole (ABILIFY) 10 MG tablet Take 1 tablet (10 mg total) by mouth daily. Patient not taking: Reported on 01/13/2019 12/10/18   Joycelyn DasPokhrel, Laxman, MD   carbamazepine (TEGRETOL) 200 MG tablet Take 0.5 tablets (100 mg total) by mouth 2 (two) times daily. Patient not taking: Reported on 01/15/2019 11/26/18   Mancel BaleWentz, Elliott, MD  furosemide (LASIX) 40 MG tablet Take 1 tablet (40 mg total) by mouth 2 (two) times daily. Patient not taking: Reported on 01/15/2019 01/01/19 05/31/19  Rai, Delene Ruffiniipudeep K, MD  glipiZIDE (GLUCOTROL) 5 MG tablet Take 0.5 tablets (2.5 mg total) by mouth 2 (two) times daily before a meal. Patient not taking: Reported on 01/15/2019 01/01/19   Rai, Delene Ruffiniipudeep K, MD  Ipratropium-Albuterol (COMBIVENT) 20-100 MCG/ACT AERS respimat Inhale 1 puff into the lungs every 6 (six) hours. Patient not taking: Reported on 01/15/2019 11/30/18   Palumbo, April, MD  lisinopril (ZESTRIL) 10 MG tablet Take 1 tablet (10 mg total) by mouth daily. Patient not taking: Reported on 01/15/2019 11/26/18   Mancel BaleWentz, Elliott, MD  metFORMIN (GLUCOPHAGE) 500 MG tablet Take 500 mg by mouth daily. 01/04/19   [provider]  potassium chloride SA (K-DUR) 20 MEQ tablet Take 1 tablet (20 mEq total) by mouth 2 (two) times daily. Patient not taking: Reported on 01/15/2019 11/30/18   Nicanor AlconPalumbo, April, MD    ALLERGIES:  Allergies  Allergen Reactions  . Haldol [Haloperidol] Other (See Comments)    Muscle spasms, loss of voluntary movement. However, pt has taken Thorazine on multiple occasions with no adverse effects.  SOCIAL HISTORY:  Social History   Tobacco Use  . Smoking status: Current Every Day Smoker    Packs/day: 1.00    Years: 20.00    Pack years: 20.00    Types: Cigarettes  . Smokeless tobacco: Current User  Substance Use Topics  . Alcohol use: Yes    Comment: Daily Drinker     FAMILY HISTORY: Family History  Problem Relation Age of Onset  . Hypertension Other   . Diabetes Other     EXAM: BP (!) 154/97   Pulse 82   Temp 99.3 F (37.4 C) (Oral)   Resp 20   SpO2 96%  CONSTITUTIONAL: Alert and oriented and responds appropriately to questions.   Chronically ill-appearing HEAD: Normocephalic EYES: Conjunctivae clear, pupils appear equal, EOMI ENT: normal nose; moist mucous membranes NECK: Supple, no meningismus, no nuchal rigidity, no LAD  CARD: RRR; S1 and S2 appreciated; no murmurs, no clicks, no rubs, no gallops RESP: Normal chest excursion without splinting or tachypnea; breath sounds clear and equal bilaterally; no wheezes, no rhonchi, no rales, no hypoxia or respiratory distress, speaking full sentences ABD/GI: Normal bowel sounds; non-distended; soft, non-tender, no rebound, no guarding, no peritoneal signs, no hepatosplenomegaly, anasarca BACK:  The back appears normal and is non-tender to palpation, there is no CVA tenderness EXT: Normal ROM in all joints; non-tender to palpation; 3+ pitting edema throughout both lower extremities that is equal bilaterally; no deformity noted to his extremities.  No tenderness over the left knee.  No joint effusion, redness or warmth.  Compartments are soft.  No calf tenderness on exam. SKIN: Normal color for age and race; warm; no rash NEURO: Moves all extremities equally PSYCH: The patient's mood and manner are appropriate. Grooming and personal hygiene are appropriate.  MEDICAL DECISION MAKING: Patient here with atypical chest pain but complains of increasing swelling and shortness of breath.  Has history of CHF and medical noncompliance.  Continues to use cocaine.  He is not hypoxic here.  EKG shows no new ischemic changes.  Will obtain labs, chest x-ray.  ED PROGRESS: Patient's work-up shows troponin of 39 and worsening BNP of 1390.  Chest x-ray shows CHF exacerbation.  It was clear yesterday.  I feel he will need admission for IV diuresis.  Will give 40mg  of IV Lasix in the ED.  5:15 AM Discussed patient's case with hospitalist, Dr. Myna Hidalgo.  I have recommended admission and patient (and family if present) agree with this plan. Admitting physician will place admission orders.   I reviewed all  nursing notes, vitals, pertinent previous records, EKGs, lab and urine results, imaging (as available).     EKG Interpretation  Date/Time:  Saturday January 16 2019 03:56:26 EDT Ventricular Rate:  97 PR Interval:    QRS Duration: 83 QT Interval:  366 QTC Calculation: 465 R Axis:   95 Text Interpretation:  Sinus rhythm Anterior infarct, old Nonspecific T abnormalities, lateral leads No significant change since last tracing Confirmed by , Cyril Mourning [phone]) on 01/16/2019 3:58:19 AM         , Delice Bison, DO 01/16/19 [id number]

## 2019-01-16 NOTE — Progress Notes (Signed)
CSW received consult for assistance for barriers to discharge. Patient is well-known to Probation officer. Placement has been sought multiple times in the patient which resulted in patient either leaving AMA or not being offered any placement beds. CSW notes patient presents frequently to the ED for medical concerns and rehospitalization interventions have been utilized. CSW following for assistance as needed.  Lamonte Richer, LCSW, [place] Worker II [phone]

## 2019-01-16 NOTE — ED Triage Notes (Signed)
[place] [facility] [place] for c/o L knee pain and chest pain. Pt reports last cocaine use yesterday. Received 324mg  asa pta. Denies cp at this time. 12 leads unremarkable. Pt a/ox4. resp e/u, nad.

## 2019-01-16 NOTE — Progress Notes (Addendum)
Please see H&P from this morning.  Essentially 58 year old male with history of schizophrenia, homelessness, cocaine abuse, chronic combined CHF with EF 25 to 30%, diabetes mellitus who has required multiple hospitalization and tends to leave AMA (twice this month) presents with acute CHF exacerbation, leg swelling/scrotal swelling.  Last use of cocaine was 1 day prior to admission.  He had low-grade temperature in the ED 100.2.  On 7/17.  Chest x-ray showed CHF and BNP at 1315.  Admitted with Lasix 40 mg IV twice daily.  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   Appears somnolent this morning.  No respiratory distress, BP elevated at 150/101 in the setting of recent cocaine use. His EKG features T-wave inversions that are similar to prior, initial HS troponin 39, and CXR consistent with CHF .  Continue diuresis, monitor input/outputs and daily weights. Adjust antihypertensives/PRN available (resumed lisinopril and added hydralazine). Per home med list, patient supposed to be on glipizide/metformin as outpatient. SSI and resumed glipizide here. Patient so far on board with admission process and is not talking about leaving AMA.  Will resume Abilify although patient reportedly not been taking psych meds (Abilify and Tegretol).  Will follow-up in a.m.

## 2019-01-16 NOTE — ED Notes (Signed)
PT was able to ambulate with minimal assistance, prior to walking PT was 97% on RA and denied SOB, upon standing PT continued to deny SOB, and was 90% on RA. Patient ambulated 15-20 steps and reported SOB and increased respirations "I'm breathing hard" pt stated. Patient continued to decline upon walking from 90%-77% on RA, patient was able to return back to the room, and get back in bed. Upon return and sitting down in the bed and taking deep breaths patient returned to 94% on RA, and 97% after 5 min on RA.

## 2019-01-17 ENCOUNTER — Emergency Department (HOSPITAL_COMMUNITY)
Admission: EM | Admit: 2019-01-17 | Discharge: 2019-01-17 | Discharge status: Against Medical Advice | Payer: Medicaid Other | Attending: Emergency Medicine | Admitting: Emergency Medicine

## 2019-01-17 ENCOUNTER — Other Ambulatory Visit: Payer: Self-pay

## 2019-01-17 DIAGNOSIS — Z5321 Procedure and treatment not carried out due to patient leaving prior to being seen by health care provider: Secondary | ICD-10-CM | POA: Diagnosis not present

## 2019-01-17 DIAGNOSIS — I4430 Unspecified atrioventricular block: Secondary | ICD-10-CM | POA: Diagnosis present

## 2019-01-17 DIAGNOSIS — Z91128 Patient's intentional underdosing of medication regimen for other reason: Secondary | ICD-10-CM | POA: Diagnosis not present

## 2019-01-17 DIAGNOSIS — N5089 Other specified disorders of the male genital organs: Secondary | ICD-10-CM

## 2019-01-17 DIAGNOSIS — T501X6A Underdosing of loop [high-ceiling] diuretics, initial encounter: Secondary | ICD-10-CM | POA: Diagnosis present

## 2019-01-17 DIAGNOSIS — Z590 Homelessness: Secondary | ICD-10-CM | POA: Diagnosis not present

## 2019-01-17 DIAGNOSIS — I110 Hypertensive heart disease with heart failure: Secondary | ICD-10-CM | POA: Diagnosis present

## 2019-01-17 DIAGNOSIS — G4730 Sleep apnea, unspecified: Secondary | ICD-10-CM | POA: Diagnosis present

## 2019-01-17 DIAGNOSIS — I5043 Acute on chronic combined systolic (congestive) and diastolic (congestive) heart failure: Secondary | ICD-10-CM | POA: Diagnosis present

## 2019-01-17 DIAGNOSIS — E119 Type 2 diabetes mellitus without complications: Secondary | ICD-10-CM | POA: Diagnosis present

## 2019-01-17 DIAGNOSIS — F17210 Nicotine dependence, cigarettes, uncomplicated: Secondary | ICD-10-CM | POA: Diagnosis present

## 2019-01-17 DIAGNOSIS — Z20828 Contact with and (suspected) exposure to other viral communicable diseases: Secondary | ICD-10-CM | POA: Diagnosis present

## 2019-01-17 DIAGNOSIS — Z9119 Patient's noncompliance with other medical treatment and regimen: Secondary | ICD-10-CM | POA: Diagnosis not present

## 2019-01-17 DIAGNOSIS — F1410 Cocaine abuse, uncomplicated: Secondary | ICD-10-CM | POA: Diagnosis present

## 2019-01-17 DIAGNOSIS — F200 Paranoid schizophrenia: Secondary | ICD-10-CM | POA: Diagnosis present

## 2019-01-17 DIAGNOSIS — D509 Iron deficiency anemia, unspecified: Secondary | ICD-10-CM | POA: Diagnosis present

## 2019-01-17 DIAGNOSIS — F1420 Cocaine dependence, uncomplicated: Secondary | ICD-10-CM | POA: Diagnosis present

## 2019-01-17 LAB — BASIC METABOLIC PANEL
Anion gap: 9 (ref 5–15)
BUN: 17 mg/dL (ref 6–20)
CO2: 29 mmol/L (ref 22–32)
Calcium: 8.9 mg/dL (ref 8.9–10.3)
Chloride: 102 mmol/L (ref 98–111)
Creatinine, Ser: 0.92 mg/dL (ref 0.61–1.24)
GFR calc Af Amer: >60 mL/min (ref >60)
GFR calc non Af Amer: >60 mL/min (ref >60)
Glucose, Bld: 177 mg/dL — ABNORMAL HIGH (ref 70–99)
Potassium: 3.8 mmol/L (ref 3.5–5.1)
Sodium: 140 mmol/L (ref 135–145)

## 2019-01-17 LAB — GLUCOSE, CAPILLARY
Glucose-Capillary: 224 mg/dL — ABNORMAL HIGH (ref 70–99)
Glucose-Capillary: 117 mg/dL — ABNORMAL HIGH (ref 70–99)

## 2019-01-17 MED ORDER — FUROSEMIDE 40 MG PO TABS: 40.0000 mg | ORAL_TABLET | Freq: Two times a day (BID) | ORAL | 0 refills | Status: DC

## 2019-01-17 MED ORDER — CEFDINIR 300 MG PO CAPS: 300.0000 mg | ORAL_CAPSULE | Freq: Two times a day (BID) | ORAL | 0 refills | Status: AC

## 2019-01-17 MED ORDER — MUPIROCIN 2 % EX OINT: TOPICAL_OINTMENT | Freq: Three times a day (TID) | CUTANEOUS | 0 refills | Status: AC
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
## 2019-01-17 NOTE — Progress Notes (Signed)
Patient requesting multiple drinks and foods. RN advise patient on fluid intake but patient will not listen. Patient had crackers, milk, ginger ale and water all within less than an hour. RN call called to patient room around 1530 that patient wanted to leave AMA, RN went to room and tried to explain to patient that he needs to stay to get his lasix for fluids removal and that the doctor was on her way to his room but he won't listen. MD already on the unit, RN called her to patient room,upon arrival to room patient sated that he was leaving because his penis and scrutom was very swollen and we are doing nothing to help him. MD explained to patient that was the reason he was getting the lasix and that his CHF is getting worse, his penis looks infected, she was going to consult urologist and start him on antibiotics but patient refused to listen, and still requested to sign AMA papers . Per MD Earnest Conroy, it was okay for him to sign and she sent him lasix and antibiotics to [facility] on [address], per patient he knows where that is and will go pick it up. Patient left AMA.
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
## 2019-01-18 ENCOUNTER — Encounter (HOSPITAL_COMMUNITY): Payer: Self-pay

## 2019-01-18 ENCOUNTER — Emergency Department (HOSPITAL_COMMUNITY)
Admission: EM | Admit: 2019-01-18 | Discharge: 2019-01-18 | Disposition: A | Discharge status: Home / Self Care | Payer: Medicare Other | Source: Home / Self Care

## 2019-01-18 ENCOUNTER — Other Ambulatory Visit: Payer: Self-pay

## 2019-01-18 ENCOUNTER — Encounter (HOSPITAL_COMMUNITY): Payer: Self-pay | Admitting: Emergency Medicine

## 2019-01-18 ENCOUNTER — Emergency Department (HOSPITAL_COMMUNITY)
Admission: EM | Admit: 2019-01-18 | Discharge: 2019-01-18 | Disposition: A | Discharge status: Home / Self Care | Payer: Medicare Other | Source: Home / Self Care | Attending: Emergency Medicine | Admitting: Emergency Medicine

## 2019-01-18 DIAGNOSIS — R609 Edema, unspecified: Secondary | ICD-10-CM

## 2019-01-18 DIAGNOSIS — Z5900 Homelessness unspecified: Secondary | ICD-10-CM

## 2019-01-18 DIAGNOSIS — I110 Hypertensive heart disease with heart failure: Secondary | ICD-10-CM | POA: Diagnosis not present

## 2019-01-18 DIAGNOSIS — R600 Localized edema: Secondary | ICD-10-CM | POA: Insufficient documentation

## 2019-01-18 DIAGNOSIS — F17210 Nicotine dependence, cigarettes, uncomplicated: Secondary | ICD-10-CM | POA: Insufficient documentation

## 2019-01-18 DIAGNOSIS — I5023 Acute on chronic systolic (congestive) heart failure: Secondary | ICD-10-CM | POA: Diagnosis not present

## 2019-01-18 DIAGNOSIS — I5042 Chronic combined systolic (congestive) and diastolic (congestive) heart failure: Secondary | ICD-10-CM | POA: Insufficient documentation

## 2019-01-18 DIAGNOSIS — Z79899 Other long term (current) drug therapy: Secondary | ICD-10-CM | POA: Insufficient documentation

## 2019-01-18 DIAGNOSIS — R45851 Suicidal ideations: Secondary | ICD-10-CM | POA: Insufficient documentation

## 2019-01-18 MED ORDER — ACETAMINOPHEN 325 MG PO TABS
325.0000 mg | ORAL_TABLET | Freq: Once | ORAL | Status: AC
  Administered 2019-01-18: 325 mg via ORAL
  Filled 2019-01-18: qty 1

## 2019-01-18 MED ORDER — FUROSEMIDE 40 MG PO TABS
40.0000 mg | ORAL_TABLET | Freq: Once | ORAL | Status: AC
  Administered 2019-01-18: 40 mg via ORAL
  Filled 2019-01-18: qty 1

## 2019-01-18 NOTE — Discharge Instructions (Addendum)
At this time there does not appear to be the presence of an emergent medical condition, however there is always the potential for conditions to change. Please read and follow the below instructions.  Please return to the Emergency Department immediately for any new or worsening symptoms. Please be sure to follow up with your Primary Care Provider within one week regarding your visit today; please call their office to schedule an appointment even if you are feeling better for a follow-up visit. Be sure to take all of your home medications as directed by your doctor.  Get help right away if: You have shortness of breath or chest pain. You cannot breathe when you lie down. You have pain, redness, or warmth in the swollen areas. You have heart, liver, or kidney disease and get edema all of a sudden. You have a fever and your symptoms get worse all of a sudden. You have thoughts of hurting yourself or others. Any new/concerning or worsening symptoms  Please read the additional information packets attached to your discharge summary.

## 2019-01-18 NOTE — ED Notes (Signed)
Pt not visualized in lobby 

## 2019-01-18 NOTE — ED Triage Notes (Signed)
Pt presents by [facility] for evaluation of suicidal ideation with plan of shooting self with gun. Pt preoccupied with prostate pain. Pt was seen at [facility] on 01/17/2019

## 2019-01-18 NOTE — ED Triage Notes (Signed)
Pt returns yet again. Pt c/o body aches. Pt reminded of earlier meds. Pt demanding more tylenol.

## 2019-01-18 NOTE — ED Notes (Signed)
Pt asleep in room.

## 2019-01-18 NOTE — ED Notes (Signed)
Pt given breakfast and states he is leaving .

## 2019-01-18 NOTE — ED Notes (Addendum)
Pt asking for shower and towels. Pt asking for tylenol. Pt informed that he needs to be seen by provider. Pt provided washcloths and that he can use the sink in his room.  Pt ambulatory to restroom.

## 2019-01-18 NOTE — ED Notes (Signed)
Pt seen walking toward bus stop with belongings bag in hand.

## 2019-01-18 NOTE — ED Provider Notes (Signed)
[facility] Provider Note   CSN: [id number] Arrival date & time: 01/18/19  0321    History   Chief Complaint Chief Complaint  Patient presents with  . Suicidal    HPI [name] is a 58 y.o. male history of homelessness, schizophrenia, diabetes, cocaine abuse, hypertension, neuropathy, CHF presented last night to the emergency department around 4 AM for suicidal ideations.  This is the patient's 102nd visit.  Per nursing notes there was a plan for the patient to shoot himself with a gun.  Initially he had no medical complaints.  Patient then slept in the emergency department for multiple hours prior to my evaluation.  Upon my initial evaluation patient is ambulating around the emergency department and washing himself in a sink ambulatory with no assistance or distress.  Shortly after this I evaluated the patient in his room.  He denies any suicidal or homicidal ideations today he denies any plan to harm himself or others.  He denies any ingestion or injury he denies hallucinations or substance abuse today.  When asked why he admitted to SI earlier today he would like a place to stay.  I then asked if patient has any complaints or concerns at this time he reports that he does not.  He is asking for home medications Tylenol and Lasix and breakfast so that way he can leave.  Patient reports that he attempts to be compliant with his home medications but has run out of his Lasix he plans to go to the pharmacy shortly after this to pick up his medications.  Patient has a history of CHF with bilateral lower extremity swelling he denies change to his leg swelling or any pain.  He denies any wounds or drainage.  Patient denies any chest pain, shortness of breath, fever/chills or any additional concerns today.  He reports this is his chronic swelling that improves with Lasix.  Of note chart review shows that patient was admitted to the hospital on 01/16/2023  CHF, he left AGAINST MEDICAL ADVICE    HPI  Past Medical History:  Diagnosis Date  . Chronic foot pain   . Cocaine abuse (HCC)   . Diabetes mellitus without complication (HCC)   . Hepatitis C    unsure   . Homelessness   . Hypertension   . Neuropathy   . Polysubstance abuse (HCC)   . Schizophrenia (HCC)   . Sleep apnea   . Systolic and diastolic CHF, chronic [facility](HCC)     Patient Active Problem List   Diagnosis Date Noted  . Chest pain 01/16/2019  . 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  . Medically noncompliant   . Acute on chronic systolic CHF (congestive heart failure) (HCC) 01/02/2019  . 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 Acute on chronic systolic heart failure (HCC) 12/26/2018  . Mobitz type II atrioventricular block 12/25/2018  . Acute exacerbation of CHF (congestive heart failure) (HCC) 12/22/2018  . Acute on chronic respiratory failure with hypoxia (HCC) 12/22/2018  . CHF exacerbation (HCC) 12/19/2018  . Acute CHF (congestive heart failure) (HCC) 12/16/2018  . Diabetes mellitus without complication (HCC) 12/11/2018  . Acute on chronic systolic (congestive) heart failure (HCC) 12/11/2018  . Abdominal pain 12/11/2018  . Nausea vomiting and diarrhea 12/11/2018  . Anemia 12/11/2018  . Evaluation by psychiatric service required   . MDD (major depressive disorder), severe (HCC) 11/25/2018  . Pressure injury of skin 11/08/2018  . Elevated troponin  10/18/2018  . HLD (hyperlipidemia) 10/18/2018  . Anxiety 10/18/2018  . CHF (congestive heart failure), NYHA class II, acute on chronic, combined (HCC) 10/18/2018  . Hypertensive urgency 10/12/2018  . Mild renal insufficiency 10/12/2018  . Cellulitis 10/12/2018  . Penile cellulitis   . Adjustment disorder with mixed disturbance of emotions and conduct   . Sleep apnea 10/03/2018  . Hypokalemia 09/17/2018  . Scrotal swelling 09/17/2018  . Urinary hesitancy   .  Constipation   . Acute on chronic combined systolic and diastolic CHF (congestive heart failure) (HCC) 08/25/2018  . Tobacco use 08/18/2018  . Homelessness 08/08/2018  . Smoker 08/08/2018  . Prostate enlargement 03/16/2018  . Aortic atherosclerosis (HCC) 03/16/2018  . 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 Chronic foot pain   . Schizoaffective disorder, bipolar type (HCC) 09/30/2016  . Substance induced mood disorder (HCC) 03/13/2015  . Schizophrenia, paranoid type (HCC) 01/17/2015  . Drug hallucinosis (HCC) 10/08/2014  . Chronic paranoid schizophrenia (HCC) 09/07/2014  . 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 Urinary retention   . Cocaine use disorder, severe, dependence (HCC)   . Essential hypertension 03/28/2013  . 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 MULTIPLE TOOTH EXTRACTIONS          Home Medications    Prior to Admission medications   Medication Sig Start Date End Date Taking? Authorizing Provider  acetaminophen (TYLENOL) 500 MG tablet Take 1 tablet (500 mg total) by mouth every 6 (six) hours as needed. Patient taking differently: Take 500 mg by mouth every 6 (six) hours as needed for mild pain.  01/08/19   Fayrene Helperran, Bowie, PA-C  ARIPiprazole (ABILIFY) 10 MG tablet Take 1 tablet (10 mg total) by mouth daily. Patient not taking: Reported on 01/13/2019 12/10/18   Joycelyn DasPokhrel, Laxman, MD  carbamazepine (TEGRETOL) 200 MG tablet Take 0.5 tablets (100 mg total) by mouth 2 (two) times daily. Patient not taking: Reported on 01/15/2019 11/26/18   Mancel BaleWentz, Elliott, MD  cefdinir (OMNICEF) 300 MG capsule Take 1 capsule (300 mg total) by mouth 2 (two) times daily for 7 days. 01/17/19 01/24/19  Alessandra BevelsKamineni, Neelima, MD  furosemide (LASIX) 40 MG tablet Take 1 tablet (40 mg total) by mouth 2 (two) times daily. 01/17/19 02/16/19   Alessandra BevelsKamineni, Neelima, MD  glipiZIDE (GLUCOTROL) 5 MG tablet Take 0.5 tablets (2.5 mg total) by mouth 2 (two) times daily before a meal. Patient not taking: Reported on 01/15/2019 01/01/19   Rai, Delene Ruffiniipudeep K, MD  Ipratropium-Albuterol (COMBIVENT) 20-100 MCG/ACT AERS respimat Inhale 1 puff into the lungs every 6 (six) hours. Patient not taking: Reported on 01/15/2019 11/30/18   Palumbo, April, MD  lisinopril (ZESTRIL) 10 MG tablet Take 1 tablet (10 mg total) by mouth daily. Patient not taking: Reported on 01/15/2019 11/26/18   Mancel BaleWentz, Elliott, MD  metFORMIN (GLUCOPHAGE) 500 MG tablet Take 500 mg by mouth daily. 01/04/19   [provider]  mupirocin ointment (BACTROBAN) 2 % Apply topically 3 (three) times daily for 7 days. Apply to affected area 3 times daily 01/17/19 01/24/19  Alessandra BevelsKamineni, Neelima, MD  potassium chloride SA (K-DUR) 20 MEQ tablet Take 1 tablet (20 mEq total) by mouth 2 (two) times daily. Patient not taking: Reported on 01/15/2019 11/30/18   Nicanor AlconPalumbo, April, MD    Family History Family History  Problem Relation Age of Onset  . Hypertension Other   .  Diabetes Other     Social History Social History   Tobacco Use  . Smoking status: Current Every Day Smoker    Packs/day: 1.00    Years: 20.00    Pack years: 20.00    Types: Cigarettes  . Smokeless tobacco: Current User  Substance Use Topics  . Alcohol use: Yes    Comment: Daily Drinker   . Drug use: Yes    Frequency: 7.0 times per week    Types: "Crack" cocaine, Cocaine, Marijuana     Allergies   Haldol [haloperidol]   Review of Systems Review of Systems  Constitutional: Negative.  Negative for chills and fever.  Respiratory: Negative.  Negative for cough and shortness of breath.   Cardiovascular: Positive for leg swelling. Negative for chest pain and palpitations.  Gastrointestinal: Negative.  Negative for abdominal pain, diarrhea, nausea and vomiting.  Musculoskeletal: Negative.  Negative for arthralgias and myalgias.   Skin: Negative.  Negative for color change and wound.  Neurological: Negative.  Negative for weakness and headaches.  Psychiatric/Behavioral: Negative for hallucinations, self-injury and suicidal ideas. The patient is not nervous/anxious.   Ten systems are reviewed and are negative for acute change except as noted in the HPI and above ROS.  Physical Exam Updated Vital Signs BP (!) 163/99   Pulse 98   Temp 98.3 F (36.8 C) (Oral)   Resp 13   SpO2 99%   Physical Exam Constitutional:      General: He is not in acute distress.    Appearance: Normal appearance. He is well-developed. He is obese. He is not ill-appearing or diaphoretic.  HENT:     Head: Normocephalic and atraumatic.     Right Ear: External ear normal.     Left Ear: External ear normal.     Nose: Nose normal.  Eyes:     General: Vision grossly intact. Gaze aligned appropriately.     Pupils: Pupils are equal, round, and reactive to light.  Neck:     Musculoskeletal: Normal range of motion.     Trachea: Trachea and phonation normal. No tracheal deviation.  Pulmonary:     Effort: Pulmonary effort is normal. No respiratory distress.  Abdominal:     Palpations: Abdomen is soft.     Tenderness: There is no abdominal tenderness. There is no guarding or rebound.  Musculoskeletal: Normal range of motion.  Skin:    General: Skin is warm and dry.  Neurological:     Mental Status: He is alert.     GCS: GCS eye subscore is 4. GCS verbal subscore is 5. GCS motor subscore is 6.     Comments: Speech is clear and goal oriented, follows commands Major Cranial nerves without deficit, no facial droop Moves extremities without ataxia, coordination intact Normal Gait  Psychiatric:        Attention and Perception: He does not perceive auditory or visual hallucinations.        Behavior: Behavior normal. Behavior is cooperative.        Thought Content: Thought content does not include homicidal or suicidal ideation.      ED  Treatments / Results  Labs (all labs ordered are listed, but only abnormal results are displayed) Labs Reviewed - No data to display  EKG EKG Interpretation  Date/Time:  Monday January 18 2019 03:31:16 EDT Ventricular Rate:  100 PR Interval:    QRS Duration: 83 QT Interval:  360 QTC Calculation: 465 R Axis:   -6 Text Interpretation:  Sinus tachycardia  Probable left atrial enlargement Anterior infarct, old No STEMI  Confirmed by Nanda Quinton [phone]) on 01/18/2019 8:35:23 AM   Radiology No results found.  Procedures Procedures (including critical care time)  Medications Ordered in ED Medications  furosemide (LASIX) tablet 40 mg (40 mg Oral Given 01/18/19 0842)  acetaminophen (TYLENOL) tablet 325 mg (325 mg Oral Given 01/18/19 0843)     Initial Impression / Assessment and Plan / ED Course  I have reviewed the triage vital signs and the nursing notes.  Pertinent labs & imaging results that were available during my care of the patient were reviewed by me and considered in my medical decision making (see chart for details).     EKG:  Sinus tachycardia Probable left atrial enlargement Anterior infarct, old No STEMI  Confirmed by Nanda Quinton [phone]) on 01/18/2019 8:35:23 AM  Patient denies suicidal or homicidal ideation states that he is feeling well after his sleep here in the emergency department he would like breakfast, Tylenol and his home dose of Lasix for chronic bilateral lower extremity edema that he describes has not changed and then to be discharged.  He has no chest pain, shortness of breath, fevers/chills or pain anywhere.  He reports that he is overall feeling well.  He denies any self injury, ingestion, hallucinations or any other concerns.  He is overall well-appearing and in no acute distress ambulating around the emergency department.  Vital signs stable.  He has been provided with 40 mg Lasix, 325 mg Tylenol and breakfast.  At this time there does not appear to be any  evidence of an acute emergency medical condition and the patient appears stable for discharge with appropriate outpatient follow up. Diagnosis was discussed with patient who verbalizes understanding of care plan and is agreeable to discharge. I have discussed return precautions with patient who verbalizes understanding of return precautions. Patient encouraged to follow-up with their PCP and take all home medications. All questions answered.  I went to go reevaluate the patient for discharge and he has already left.  No indication to IVC the patient here in the emergency department.  Patient's case discussed with Dr. Laverta Baltimore who agrees with plan to discharge with follow-up.   Note: Portions of this report may have been transcribed using voice recognition software. Every effort was made to ensure accuracy; however, inadvertent computerized transcription errors may still be present. Final Clinical Impressions(s) / ED Diagnoses   Final diagnoses:  Homelessness  Peripheral edema    ED Discharge Orders    None       Gari Crown 01/18/19 [id number]    Margette Fast, MD 01/19/19 1036

## 2019-01-18 NOTE — ED Notes (Signed)
Pt provided socks

## 2019-01-18 NOTE — ED Notes (Addendum)
Pt says he has no medical complaint and that he just wants a sandwich and ice cream

## 2019-01-19 ENCOUNTER — Inpatient Hospital Stay (HOSPITAL_COMMUNITY)
Admission: EM | Admit: 2019-01-19 | Discharge: 2019-01-20 | DRG: 291 | Discharge status: Against Medical Advice | Payer: Medicare Other | Attending: Internal Medicine | Admitting: Internal Medicine

## 2019-01-19 ENCOUNTER — Encounter (HOSPITAL_COMMUNITY): Payer: Self-pay | Admitting: Emergency Medicine

## 2019-01-19 ENCOUNTER — Emergency Department (HOSPITAL_COMMUNITY): Payer: Medicare Other

## 2019-01-19 ENCOUNTER — Other Ambulatory Visit: Payer: Self-pay

## 2019-01-19 DIAGNOSIS — Z8249 Family history of ischemic heart disease and other diseases of the circulatory system: Secondary | ICD-10-CM

## 2019-01-19 DIAGNOSIS — N5089 Other specified disorders of the male genital organs: Secondary | ICD-10-CM | POA: Diagnosis present

## 2019-01-19 DIAGNOSIS — L03818 Cellulitis of other sites: Secondary | ICD-10-CM | POA: Diagnosis not present

## 2019-01-19 DIAGNOSIS — R269 Unspecified abnormalities of gait and mobility: Secondary | ICD-10-CM | POA: Diagnosis present

## 2019-01-19 DIAGNOSIS — Z9114 Patient's other noncompliance with medication regimen: Secondary | ICD-10-CM

## 2019-01-19 DIAGNOSIS — F250 Schizoaffective disorder, bipolar type: Secondary | ICD-10-CM

## 2019-01-19 DIAGNOSIS — Z590 Homelessness: Secondary | ICD-10-CM

## 2019-01-19 DIAGNOSIS — D509 Iron deficiency anemia, unspecified: Secondary | ICD-10-CM | POA: Diagnosis present

## 2019-01-19 DIAGNOSIS — Z87891 Personal history of nicotine dependence: Secondary | ICD-10-CM | POA: Diagnosis not present

## 2019-01-19 DIAGNOSIS — F1410 Cocaine abuse, uncomplicated: Secondary | ICD-10-CM | POA: Diagnosis present

## 2019-01-19 DIAGNOSIS — N4822 Cellulitis of corpus cavernosum and penis: Secondary | ICD-10-CM | POA: Diagnosis present

## 2019-01-19 DIAGNOSIS — F19929 Other psychoactive substance use, unspecified with intoxication, unspecified: Secondary | ICD-10-CM

## 2019-01-19 DIAGNOSIS — J9621 Acute and chronic respiratory failure with hypoxia: Secondary | ICD-10-CM | POA: Diagnosis present

## 2019-01-19 DIAGNOSIS — R000 Tachycardia, unspecified: Secondary | ICD-10-CM | POA: Diagnosis present

## 2019-01-19 DIAGNOSIS — E1165 Type 2 diabetes mellitus with hyperglycemia: Secondary | ICD-10-CM | POA: Diagnosis present

## 2019-01-19 DIAGNOSIS — I4430 Unspecified atrioventricular block: Secondary | ICD-10-CM | POA: Diagnosis present

## 2019-01-19 DIAGNOSIS — Z794 Long term (current) use of insulin: Secondary | ICD-10-CM

## 2019-01-19 DIAGNOSIS — Z5329 Procedure and treatment not carried out because of patient's decision for other reasons: Secondary | ICD-10-CM | POA: Diagnosis present

## 2019-01-19 DIAGNOSIS — I5023 Acute on chronic systolic (congestive) heart failure: Secondary | ICD-10-CM

## 2019-01-19 DIAGNOSIS — I5043 Acute on chronic combined systolic (congestive) and diastolic (congestive) heart failure: Secondary | ICD-10-CM

## 2019-01-19 DIAGNOSIS — Z1159 Encounter for screening for other viral diseases: Secondary | ICD-10-CM

## 2019-01-19 DIAGNOSIS — L0390 Cellulitis, unspecified: Secondary | ICD-10-CM | POA: Diagnosis present

## 2019-01-19 DIAGNOSIS — Z9119 Patient's noncompliance with other medical treatment and regimen: Secondary | ICD-10-CM | POA: Diagnosis not present

## 2019-01-19 DIAGNOSIS — F1414 Cocaine abuse with cocaine-induced mood disorder: Secondary | ICD-10-CM | POA: Diagnosis present

## 2019-01-19 DIAGNOSIS — F1994 Other psychoactive substance use, unspecified with psychoactive substance-induced mood disorder: Secondary | ICD-10-CM | POA: Diagnosis not present

## 2019-01-19 DIAGNOSIS — I5040 Unspecified combined systolic (congestive) and diastolic (congestive) heart failure: Secondary | ICD-10-CM | POA: Diagnosis present

## 2019-01-19 DIAGNOSIS — E119 Type 2 diabetes mellitus without complications: Secondary | ICD-10-CM | POA: Diagnosis not present

## 2019-01-19 DIAGNOSIS — I110 Hypertensive heart disease with heart failure: Principal | ICD-10-CM | POA: Diagnosis present

## 2019-01-19 DIAGNOSIS — E1140 Type 2 diabetes mellitus with diabetic neuropathy, unspecified: Secondary | ICD-10-CM | POA: Diagnosis present

## 2019-01-19 LAB — CBC WITH DIFFERENTIAL/PLATELET
Abs Immature Granulocytes: 0.05 10*3/uL (ref 0.00–0.07)
Basophils Absolute: 0.1 10*3/uL (ref 0.0–0.1)
Basophils Relative: 1 %
Eosinophils Absolute: 0 10*3/uL (ref 0.0–0.5)
Eosinophils Relative: 0 %
HCT: 30.4 % — ABNORMAL LOW (ref 39.0–52.0)
Hemoglobin: 8.7 g/dL — ABNORMAL LOW (ref 13.0–17.0)
Immature Granulocytes: 1 %
Lymphocytes Relative: 10 %
Lymphs Abs: 0.9 10*3/uL (ref 0.7–4.0)
MCH: 22 pg — ABNORMAL LOW (ref 26.0–34.0)
MCHC: 28.6 g/dL — ABNORMAL LOW (ref 30.0–36.0)
MCV: 76.8 fL — ABNORMAL LOW (ref 80.0–100.0)
Monocytes Absolute: 1 10*3/uL (ref 0.1–1.0)
Monocytes Relative: 11 %
Neutro Abs: 7.2 10*3/uL (ref 1.7–7.7)
Neutrophils Relative %: 77 %
Platelets: 366 10*3/uL (ref 150–400)
RBC: 3.96 MIL/uL — ABNORMAL LOW (ref 4.22–5.81)
RDW: 20.9 % — ABNORMAL HIGH (ref 11.5–15.5)
WBC: 9.2 10*3/uL (ref 4.0–10.5)
nRBC: 0.4 % — ABNORMAL HIGH (ref 0.0–0.2)

## 2019-01-19 LAB — BASIC METABOLIC PANEL
Anion gap: 10 (ref 5–15)
BUN: 15 mg/dL (ref 6–20)
CO2: 26 mmol/L (ref 22–32)
Calcium: 8.9 mg/dL (ref 8.9–10.3)
Chloride: 100 mmol/L (ref 98–111)
Creatinine, Ser: 0.92 mg/dL (ref 0.61–1.24)
GFR calc Af Amer: >60 mL/min (ref >60)
GFR calc non Af Amer: >60 mL/min (ref >60)
Glucose, Bld: 211 mg/dL — ABNORMAL HIGH (ref 70–99)
Potassium: 4.1 mmol/L (ref 3.5–5.1)
Sodium: 136 mmol/L (ref 135–145)

## 2019-01-19 LAB — GLUCOSE, CAPILLARY
Glucose-Capillary: 242 mg/dL — ABNORMAL HIGH (ref 70–99)
Glucose-Capillary: 172 mg/dL — ABNORMAL HIGH (ref 70–99)

## 2019-01-19 LAB — C-REACTIVE PROTEIN: CRP: 5.2 mg/dL — ABNORMAL HIGH (ref <1.0)

## 2019-01-19 LAB — BRAIN NATRIURETIC PEPTIDE: B Natriuretic Peptide: 1185.1 pg/mL — ABNORMAL HIGH (ref 0.0–100.0)

## 2019-01-19 LAB — SARS CORONAVIRUS 2 BY RT PCR (HOSPITAL ORDER, PERFORMED IN ~~LOC~~ HOSPITAL LAB): SARS Coronavirus 2: NEGATIVE

## 2019-01-19 MED ORDER — LISINOPRIL 10 MG PO TABS
10.0000 mg | ORAL_TABLET | Freq: Every day | ORAL | Status: DC
  Administered 2019-01-20: 10 mg via ORAL
  Filled 2019-01-19: qty 1

## 2019-01-19 MED ORDER — FUROSEMIDE 10 MG/ML IJ SOLN
40.0000 mg | Freq: Two times a day (BID) | INTRAMUSCULAR | Status: DC
  Administered 2019-01-19 – 2019-01-20 (×2): 40 mg via INTRAVENOUS
  Filled 2019-01-19 (×2): qty 4

## 2019-01-19 MED ORDER — CEFDINIR 300 MG PO CAPS
300.0000 mg | ORAL_CAPSULE | Freq: Two times a day (BID) | ORAL | Status: DC
  Administered 2019-01-19 – 2019-01-20 (×2): 300 mg via ORAL
  Filled 2019-01-19 (×3): qty 1

## 2019-01-19 MED ORDER — IPRATROPIUM-ALBUTEROL 0.5-2.5 (3) MG/3ML IN SOLN: 3.0000 mL | Freq: Four times a day (QID) | RESPIRATORY_TRACT | Status: DC | PRN

## 2019-01-19 MED ORDER — SODIUM CHLORIDE 0.9% FLUSH: 3.0000 mL | INTRAVENOUS | Status: DC | PRN

## 2019-01-19 MED ORDER — CEPHALEXIN 250 MG PO CAPS
500.0000 mg | ORAL_CAPSULE | Freq: Three times a day (TID) | ORAL | Status: DC
  Administered 2019-01-19: 500 mg via ORAL
  Filled 2019-01-19: qty 2

## 2019-01-19 MED ORDER — CARBAMAZEPINE 200 MG PO TABS
100.0000 mg | ORAL_TABLET | Freq: Two times a day (BID) | ORAL | Status: DC
  Administered 2019-01-19 – 2019-01-20 (×2): 100 mg via ORAL
  Filled 2019-01-19 (×3): qty 0.5

## 2019-01-19 MED ORDER — SODIUM CHLORIDE 0.9% FLUSH
3.0000 mL | Freq: Two times a day (BID) | INTRAVENOUS | Status: DC
  Administered 2019-01-19 – 2019-01-20 (×3): 3 mL via INTRAVENOUS

## 2019-01-19 MED ORDER — SODIUM CHLORIDE 0.9 % IV SOLN: 250.0000 mL | INTRAVENOUS | Status: DC | PRN

## 2019-01-19 MED ORDER — INSULIN ASPART 100 UNIT/ML ~~LOC~~ SOLN: 0.0000 [IU] | Freq: Every day | SUBCUTANEOUS | Status: DC

## 2019-01-19 MED ORDER — ENOXAPARIN SODIUM 40 MG/0.4ML ~~LOC~~ SOLN
40.0000 mg | Freq: Every day | SUBCUTANEOUS | Status: DC
  Administered 2019-01-19 – 2019-01-20 (×2): 40 mg via SUBCUTANEOUS
  Filled 2019-01-19 (×2): qty 0.4

## 2019-01-19 MED ORDER — ACETAMINOPHEN 325 MG PO TABS
650.0000 mg | ORAL_TABLET | ORAL | Status: DC | PRN
  Administered 2019-01-19 – 2019-01-20 (×4): 650 mg via ORAL
  Filled 2019-01-19 (×4): qty 2

## 2019-01-19 MED ORDER — ARIPIPRAZOLE 5 MG PO TABS
10.0000 mg | ORAL_TABLET | Freq: Every day | ORAL | Status: DC
  Administered 2019-01-19 – 2019-01-20 (×2): 10 mg via ORAL
  Filled 2019-01-19 (×2): qty 2

## 2019-01-19 MED ORDER — POTASSIUM CHLORIDE CRYS ER 20 MEQ PO TBCR
20.0000 meq | EXTENDED_RELEASE_TABLET | Freq: Two times a day (BID) | ORAL | Status: DC
  Administered 2019-01-20: 10:00:00 20 meq via ORAL
  Filled 2019-01-19: qty 1

## 2019-01-19 MED ORDER — INSULIN ASPART 100 UNIT/ML ~~LOC~~ SOLN
0.0000 [IU] | Freq: Three times a day (TID) | SUBCUTANEOUS | Status: DC
  Administered 2019-01-19: 3 [IU] via SUBCUTANEOUS
  Administered 2019-01-20: 5 [IU] via SUBCUTANEOUS
  Administered 2019-01-20: 13:00:00 8 [IU] via SUBCUTANEOUS

## 2019-01-19 MED ORDER — ONDANSETRON HCL 4 MG/2ML IJ SOLN: 4.0000 mg | Freq: Four times a day (QID) | INTRAMUSCULAR | Status: DC | PRN

## 2019-01-19 MED ORDER — FUROSEMIDE 10 MG/ML IJ SOLN
40.0000 mg | Freq: Once | INTRAMUSCULAR | Status: AC
  Administered 2019-01-19: 40 mg via INTRAVENOUS
  Filled 2019-01-19: qty 4

## 2019-01-19 NOTE — ED Triage Notes (Signed)
BIB [facility] from street. C/O of continued swelling.

## 2019-01-19 NOTE — H&P (Addendum)
History and Physical    [name] ZOX:[id number] DOB: 05-16-1961 DOA: 01/19/2019  Referring MD/NP/PA: Valetta MoleJoanna Soto, PA-C PCP: Patient, No Pcp Per  Patient coming from: via EMS  Chief Complaint: Swelling  I have personally briefly reviewed patient's old medical records in [facility]   HPI: Taylor MemosFrederick L Menzer is a 58 y.o. 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  He reports that he has been having worsening swelling from his legs all the way up into his abdomen.  Complains that head which is been biting him in his private causing him pain in his testicles.  Patient was just recently admitted in the hospital on 7/18 for congestive heart failure exacerbation.  Since being discharged he reports using cocaine every day.  Other associated symptoms include reports of,  bite his lower lip, darkening discoloration of bilateral lower extremities, and shortness of breath with exertion.  Patient has had several ED visits and admissions previously leaving AMA.  He currently denies having any chest pain symptoms.  Denies any wanting to quit.  ED Course: Upon admission into the emergency department patient was noted to be afebrile, pulse 98-106, blood pressure 163/99-177/107, respirations elevated at 29, and O2 saturations 92 to 99% on room air.  Labs revealed hemoglobin 8.7, BUN 15, creatinine 0.92, glucose 211, and BNP 1185.1(slightly lower than previous of 1390.9 on 7/18).  Chest x-ray showing cardiomegaly with pulmonary edema and small pleural effusions greater on the left.  Patient was given 40 mg of Lasix IV.  TRH called to admit.   Review of Systems  Constitutional: Negative for chills and fever.  HENT: Negative for congestion and nosebleeds.   Eyes: Negative for pain.    Respiratory: Positive for shortness of breath.   Cardiovascular: Positive for leg swelling. Negative for chest pain.  Gastrointestinal: Negative for abdominal pain and vomiting.  Genitourinary: Negative for dysuria and hematuria.       Positive for testicular swelling   Musculoskeletal: Positive for falls.  Skin:       Positive for skin color change  Neurological: Positive for weakness. Negative for focal weakness.  Psychiatric/Behavioral: Positive for substance abuse. Negative for suicidal ideas.    Past Medical History:  Diagnosis Date   Chronic foot pain    Cocaine abuse (HCC)    Diabetes mellitus without complication (HCC)    Hepatitis C    unsure    Homelessness    Hypertension    Neuropathy    Polysubstance abuse (HCC)    Schizophrenia (HCC)    Sleep apnea    Systolic and diastolic CHF, chronic (HCC)     Past Surgical History:  Procedure Laterality Date   MULTIPLE TOOTH EXTRACTIONS       reports that he has been smoking cigarettes. He has a 20.00 pack-year smoking history. He uses smokeless tobacco. He reports current alcohol use. He reports current drug use. Frequency: 7.00 times per week. Drugs: "Crack" cocaine, Cocaine, and Marijuana.  Allergies  Allergen Reactions   Haldol [Haloperidol] Other (See Comments)    Muscle spasms, loss of voluntary movement. However, pt has taken Thorazine on multiple occasions with no adverse effects.     Family History  Problem Relation Age of Onset   Hypertension Other    Diabetes Other     Prior to Admission medications   Medication Sig  Start Date End Date Taking? Authorizing Provider  acetaminophen (TYLENOL) 500 MG tablet Take 1 tablet (500 mg total) by mouth every 6 (six) hours as needed. Patient taking differently: Take 500 mg by mouth every 6 (six) hours as needed for mild pain.  01/08/19   Fayrene Helper, PA-C  ARIPiprazole (ABILIFY) 10 MG tablet Take 1 tablet (10 mg total) by mouth daily. Patient not  taking: Reported on 01/13/2019 12/10/18   Joycelyn Das, MD  carbamazepine (TEGRETOL) 200 MG tablet Take 0.5 tablets (100 mg total) by mouth 2 (two) times daily. Patient not taking: Reported on 01/15/2019 11/26/18   Mancel Bale, MD  cefdinir (OMNICEF) 300 MG capsule Take 1 capsule (300 mg total) by mouth 2 (two) times daily for 7 days. 01/17/19 01/24/19  Alessandra Bevels, MD  furosemide (LASIX) 40 MG tablet Take 1 tablet (40 mg total) by mouth 2 (two) times daily. 01/17/19 02/16/19  Alessandra Bevels, MD  glipiZIDE (GLUCOTROL) 5 MG tablet Take 0.5 tablets (2.5 mg total) by mouth 2 (two) times daily before a meal. Patient not taking: Reported on 01/15/2019 01/01/19   Rai, Delene Ruffini, MD  Ipratropium-Albuterol (COMBIVENT) 20-100 MCG/ACT AERS respimat Inhale 1 puff into the lungs every 6 (six) hours. Patient not taking: Reported on 01/15/2019 11/30/18   Palumbo, April, MD  lisinopril (ZESTRIL) 10 MG tablet Take 1 tablet (10 mg total) by mouth daily. Patient not taking: Reported on 01/15/2019 11/26/18   Mancel Bale, MD  metFORMIN (GLUCOPHAGE) 500 MG tablet Take 500 mg by mouth daily. 01/04/19   [provider]  mupirocin ointment (BACTROBAN) 2 % Apply topically 3 (three) times daily for 7 days. Apply to affected area 3 times daily 01/17/19 01/24/19  Alessandra Bevels, MD  potassium chloride SA (K-DUR) 20 MEQ tablet Take 1 tablet (20 mEq total) by mouth 2 (two) times daily. Patient not taking: Reported on 01/15/2019 11/30/18   Nicanor Alcon, April, MD    Physical Exam:  Constitutional: Disheveled elderly appearing male NAD, calm, comfortable Vitals:   01/19/19 0639 01/19/19 0640 01/19/19 0730  BP: (!) 164/109 (!) 164/109 (!) 177/107  Pulse:  (!) 104 (!) 106  Resp:  20 (!) 22  Temp:  97.9 F (36.6 C)   TempSrc:  Oral   SpO2:  92% 94%  Weight: 104 kg    Height:  (1.753 m)     Eyes: PERRL, lids and conjunctivae normal ENMT: Mucous membranes are dry.  Bite mark to the right lower lip. Poor  dentition with multiple missing teeth and dental caries. Neck: normal, supple, no masses, no thyromegaly.  JVD present. Respiratory: Positive crackles appreciated in an both lung fields.  Patient on 2 L nasal cannula oxygen. Cardiovascular: Tachycardic, no  rubs / gallops.  At least 2+ pitting lower extremity edema to his thighs. 2+ pedal pulses. No carotid bruits.  Abdomen: Minimally distended abdomen.  No tenderness, no masses palpated. No hepatosplenomegaly. Bowel sounds positive.  Genital exam: Swollen scrotum palpation. Musculoskeletal: no clubbing / cyanosis. No joint deformity upper and lower extremities. Good ROM, no contractures. Normal muscle tone.  Skin: no rashes, lesions, ulcers. No induration Neurologic: CN 2-12 grossly intact. Sensation intact, DTR normal. Strength 5/5 in all 4.  Psychiatric: Poor judgment and insight. Alert and oriented x 3. Normal mood.     Labs on Admission: I have personally reviewed following labs and imaging studies  CBC: Recent Labs  Lab 01/13/19 1004 01/14/19 1327 01/16/19 0352 01/19/19 0718  WBC 8.4 7.8 9.0 9.2  NEUTROABS  6.9 6.3  --  7.2  HGB 8.4* 8.7* 8.8* 8.7*  HCT 29.7* 30.5* 30.8* 30.4*  MCV 78.0* 78.8* 77.0* 76.8*  PLT 278 256 325 875   Basic Metabolic Panel: Recent Labs  Lab 01/13/19 1004 01/14/19 1327 01/16/19 0352 01/17/19 0328 01/19/19 0718  NA 138 137 137 140 136  K 2.9* 3.5 3.9 3.8 4.1  CL 105 104 104 102 100  CO2 23 24 26 29 26   GLUCOSE 219* 335* 232* 177* 211*  BUN 11 14 12 17 15   CREATININE 0.95 0.95 0.91 0.92 0.92  CALCIUM 8.6* 8.6* 8.8* 8.9 8.9   GFR: Estimated Creatinine Clearance: 105.3 mL/min (by C-G formula based on SCr of 0.92 mg/dL). Liver Function Tests: No results for input(s): AST, ALT, ALKPHOS, BILITOT, PROT, ALBUMIN in the last 168 hours. No results for input(s): LIPASE, AMYLASE in the last 168 hours. No results for input(s): AMMONIA in the last 168 hours. Coagulation Profile: No results for  input(s): INR, PROTIME in the last 168 hours. Cardiac Enzymes: No results for input(s): CKTOTAL, CKMB, CKMBINDEX, TROPONINI in the last 168 hours. BNP (last 3 results) No results for input(s): PROBNP in the last 8760 hours. HbA1C: No results for input(s): HGBA1C in the last 72 hours. CBG: Recent Labs  Lab 01/16/19 1201 01/16/19 1616 01/16/19 2042 01/17/19 0623 01/17/19 1133  GLUCAP 232* 188* 160* 224* 117*   Lipid Profile: No results for input(s): CHOL, HDL, LDLCALC, TRIG, CHOLHDL, LDLDIRECT in the last 72 hours. Thyroid Function Tests: No results for input(s): TSH, T4TOTAL, FREET4, T3FREE, THYROIDAB in the last 72 hours. Anemia Panel: No results for input(s): VITAMINB12, FOLATE, FERRITIN, TIBC, IRON, RETICCTPCT in the last 72 hours. Urine analysis:    Component Value Date/Time   COLORURINE YELLOW 01/14/2019 1500   APPEARANCEUR CLEAR 01/14/2019 1500   LABSPEC 1.018 01/14/2019 1500   PHURINE 5.0 01/14/2019 1500   GLUCOSEU >=500 (A) 01/14/2019 1500   HGBUR SMALL (A) 01/14/2019 1500   BILIRUBINUR NEGATIVE 01/14/2019 1500   KETONESUR NEGATIVE 01/14/2019 1500   PROTEINUR >=300 (A) 01/14/2019 1500   UROBILINOGEN 1.0 03/14/2015 0610   NITRITE NEGATIVE 01/14/2019 1500   LEUKOCYTESUR NEGATIVE 01/14/2019 1500   Sepsis Labs: Recent Results (from the past 240 hour(s))  SARS Coronavirus 2 (CEPHEID - Performed in [facility] hospital lab), Hosp Order     Status: None   Collection Time: 01/16/19  4:50 AM   Specimen: Nasopharyngeal Swab  Result Value Ref Range Status   SARS Coronavirus 2 NEGATIVE NEGATIVE Final    Comment: (NOTE) If result is NEGATIVE SARS-CoV-2 target nucleic acids are NOT DETECTED. The SARS-CoV-2 RNA is generally detectable in upper and lower  respiratory specimens during the acute phase of infection. The lowest  concentration of SARS-CoV-2 viral copies this assay can detect is 250  copies / mL. A negative result does not preclude SARS-CoV-2 infection  and  should not be used as the sole basis for treatment or other  patient management decisions.  A negative result may occur with  improper specimen collection / handling, submission of specimen other  than nasopharyngeal swab, presence of viral mutation(s) within the  areas targeted by this assay, and inadequate number of viral copies  (<250 copies / mL). A negative result must be combined with clinical  observations, patient history, and epidemiological information. If result is POSITIVE SARS-CoV-2 target nucleic acids are DETECTED. The SARS-CoV-2 RNA is generally detectable in upper and lower  respiratory specimens dur ing the acute phase of infection.  Positive  results are indicative of active infection with SARS-CoV-2.  Clinical  correlation with patient history and other diagnostic information is  necessary to determine patient infection status.  Positive results do  not rule out bacterial infection or co-infection with other viruses. If result is PRESUMPTIVE POSTIVE SARS-CoV-2 nucleic acids MAY BE PRESENT.   A presumptive positive result was obtained on the submitted specimen  and confirmed on repeat testing.  While 2019 novel coronavirus  (SARS-CoV-2) nucleic acids may be present in the submitted sample  additional confirmatory testing may be necessary for epidemiological  and / or clinical management purposes  to differentiate between  SARS-CoV-2 and other Sarbecovirus currently known to infect humans.  If clinically indicated additional testing with an alternate test  methodology [phone] is advised. The SARS-CoV-2 RNA is generally  detectable in upper and lower respiratory sp ecimens during the acute  phase of infection. The expected result is Negative. Fact Sheet for Patients:  BoilerBrush.com.cyhttps://www.fda.gov/media/136312/download Fact Sheet for Healthcare Providers: https://pope.com/https://www.fda.gov/media/136313/download This test is not yet approved or cleared by the [place] FDA and has been  authorized for detection and/or diagnosis of SARS-CoV-2 by FDA under an Emergency Use Authorization (EUA).  This EUA will remain in effect (meaning this test can be used) for the duration of the COVID-19 declaration under Section 564(b)(1) of the Act, 21 U.S.C. section 360bbb-3(b)(1), unless the authorization is terminated or revoked sooner. Performed at [facility], [address] [place], [place] [address]      Radiological Exams on Admission: Dg Chest Portable 1 View  Result Date: 01/19/2019 CLINICAL DATA:  Chest pain and shortness of breath. EXAM: PORTABLE CHEST 1 VIEW COMPARISON:  Single-view of the chest 01/16/2019, 01/15/2019 and 11/17/2018. FINDINGS: There is cardiomegaly. Pulmonary edema is new since the most recent examination. Small bilateral pleural effusions are present, larger on the left. Atelectasis in the lingula and left lung base noted. No pneumothorax. Aortic atherosclerosis. No acute or focal bony abnormality. IMPRESSION: Cardiomegaly and pulmonary edema with associated small pleural effusions, greater on the left. Electronically Signed   By: Drusilla Kannerhomas  Dalessio M.D.   On: 01/19/2019 07:55    EKG: Independently reviewed.  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  Assessment/Plan Acute on chronic systolic and diastolic congestive heart failure exacerbation: Patient presents with complaints of swelling.  O2 saturations noted to be stable on room air at rest.  Physical exam notes pitting edema.  BNP elevated at 1185.1 but slightly improved from just 3 days ago.  Chest x-ray showing CHF with pulmonary edema.  EF noted to be 25 to 30% in May 2020 on echocardiogram.  Patient was given 40 mg of Lasix IV. - Admit to a telemetry bed  - Heart failure orders set  initiated  - Continuous pulse oximetry with nasal cannula oxygen as needed to keep O2 saturations >92% - Strict I&Os and daily weights - Elevate lower extremities - Lasix 40 mg IV  Bid - Reassess in a.m. and adjust diuresis as needed. - Check echocardiogram - Optimize medical management as able  Scrotal swelling and cellulitis: Patient has been treated on multiple hospitalizations, but does not continue antibiotics once leaves AMA. -Check CRP (elevated at 5.2) -Scrotal support -Restart cephalexin   Essential hypertension, uncontrolled: Chronic.  Patient blood pressures initially elevated up to 177/107. -Continue lisinopril  Diabetes mellitus type 2, uncontrolled: On admission glucose elevated at 211.  Last hemoglobin A1c noted to be 9.7 on 12/04/2018. -Hypoglycemic  protocol -Hold glipizide -CBGs q. before meals and at bedtime with moderate SSI  Microcytic anemia: Chronic.  Hemoglobin 8.7 which appears near patient's baseline.  Labs consistent with iron deficiency. -Continue to monitor  Schizophrenia: Patient not on any medications for treatment which likely contributed to frequent ED visits and hospitalizations.   -Continue Abilify and Tegretol  Polysubstance abuse: Patient reports utilizing cocaine every day that he can and declines any will of wanting to stop at this time. 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 -Continue to avoid AV nodal blockers -Follow-up telemetry   DVT prophylaxis: Lovenox Code Status: Full Family Communication: Contact not available by phone. Disposition Plan: To be determined Consults called: None Admission status: Inpatient  Clydie Braunondell A Lenton Gendreau MD [facility] Pager [phone]   If 7PM-7AM, please contact night-coverage [email or web address] Password [email or web address]  01/19/2019, 9:33 AM

## 2019-01-19 NOTE — ED Provider Notes (Signed)
[facility] EMERGENCY DEPARTMENT Provider Note   CSN: [id number] Arrival date & time:       History   Chief Complaint Chief Complaint  Patient presents with  . Multiple Complaints    HPI [name] is a 58 y.o. male.     58 y/o male with a am extensive PMH including DM, cocaine abuse, presents to the ED via EMS with a chief complaint of worsening leg swelling. Patient was seen   The history is provided by the patient and medical records.    Past Medical History:  Diagnosis Date  . Chronic foot pain   . Cocaine abuse ([place])   . Diabetes mellitus without complication ([place])   . Hepatitis C    unsure   . Homelessness   . Hypertension   . Neuropathy   . Polysubstance abuse ([place])   . Schizophrenia ([place])   . Sleep apnea   . Systolic and diastolic CHF, chronic [facility])     Patient Active Problem List   Diagnosis Date Noted  . Chest pain 01/16/2019  . 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  . Medically noncompliant   . Acute on chronic systolic CHF (congestive heart failure) ([place]) 01/02/2019  . Acute on chronic combined systolic (congestive) and diastolic (congestive) heart failure ([place]) 01/02/2019  . Acute on chronic systolic heart failure ([place]) 12/26/2018  . Mobitz type II atrioventricular block 12/25/2018  . Acute exacerbation of CHF (congestive heart failure) ([place]) 12/22/2018  . Acute on chronic respiratory failure with hypoxia ([place]) 12/22/2018  . CHF exacerbation ([place]) 12/19/2018  . Acute CHF (congestive heart failure) ([place]) 12/16/2018  . Diabetes mellitus without complication ([place]) 23/55/7322  . Acute on chronic systolic (congestive) heart failure ([place]) 12/11/2018  . Abdominal pain 12/11/2018  . Nausea vomiting and diarrhea 12/11/2018  . Anemia 12/11/2018  . Evaluation by psychiatric service required   . MDD (major depressive disorder), severe ([place]) 11/25/2018  . Pressure injury of skin 11/08/2018  . Elevated troponin 10/18/2018  . HLD  (hyperlipidemia) 10/18/2018  . Anxiety 10/18/2018  . CHF (congestive heart failure), NYHA class II, acute on chronic, combined ([place]) 10/18/2018  . Hypertensive urgency 10/12/2018  . Mild renal insufficiency 10/12/2018  . Cellulitis 10/12/2018  . Penile cellulitis   . Adjustment disorder with mixed disturbance of emotions and conduct   . Sleep apnea 10/03/2018  . Hypokalemia 09/17/2018  . Scrotal swelling 09/17/2018  . Urinary hesitancy   . Constipation   . Acute on chronic combined systolic and diastolic CHF (congestive heart failure) ([place]) 08/25/2018  . Tobacco use 08/18/2018  . Homelessness 08/08/2018  . Smoker 08/08/2018  . Prostate enlargement 03/16/2018  . Aortic atherosclerosis ([place]) 03/16/2018  . 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 Chronic foot pain   . Schizoaffective disorder, bipolar type ([place]) 09/30/2016  . Substance induced mood disorder ([place]) 03/13/2015  . Schizophrenia, paranoid type ([place]) 01/17/2015  . Drug hallucinosis ([place]) 10/08/2014  . Chronic paranoid schizophrenia ([place]) 09/07/2014  . 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 Urinary retention   . Cocaine use disorder, severe, dependence ([place])   . Essential hypertension 03/28/2013  . Insulin-requiring or dependent type II diabetes mellitus ([place]) 03/15/2013    Past Surgical History:  Procedure Laterality Date  . MULTIPLE TOOTH EXTRACTIONS          Home Medications    Prior to Admission medications   Medication Sig Start Date End Date Taking? Authorizing Provider  acetaminophen (TYLENOL) 500 MG tablet Take 1 tablet (500 mg total) by mouth every 6 (six) hours as needed. Patient taking differently: Take 500 mg by mouth every 6 (six) hours as needed for mild pain.  01/08/19   Fayrene Helperran, [place], PA-C  ARIPiprazole (ABILIFY) 10 MG tablet Take 1 tablet (10 mg total) by mouth daily. Patient not taking: Reported on 01/13/2019 12/10/18    Joycelyn DasPokhrel, [place], MD  carbamazepine (TEGRETOL) 200 MG tablet Take 0.5 tablets (100 mg total) by mouth 2 (two) times daily. Patient not taking: Reported on 01/15/2019 11/26/18   Mancel BaleWentz, [place], MD  cefdinir (OMNICEF) 300 MG capsule Take 1 capsule (300 mg total) by mouth 2 (two) times daily for 7 days. 01/17/19 01/24/19  Alessandra BevelsKamineni, Neelima, MD  furosemide (LASIX) 40 MG tablet Take 1 tablet (40 mg total) by mouth 2 (two) times daily. 01/17/19 02/16/19  Alessandra BevelsKamineni, Neelima, MD  glipiZIDE (GLUCOTROL) 5 MG tablet Take 0.5 tablets (2.5 mg total) by mouth 2 (two) times daily before a meal. Patient not taking: Reported on 01/15/2019 01/01/19   Rai, Delene Ruffiniipudeep K, MD  Ipratropium-Albuterol (COMBIVENT) 20-100 MCG/ACT AERS respimat Inhale 1 puff into the lungs every 6 (six) hours. Patient not taking: Reported on 01/15/2019 11/30/18   Palumbo, April, MD  lisinopril (ZESTRIL) 10 MG tablet Take 1 tablet (10 mg total) by mouth daily. Patient not taking: Reported on 01/15/2019 11/26/18   Mancel BaleWentz, [place], MD  metFORMIN (GLUCOPHAGE) 500 MG tablet Take 500 mg by mouth daily. 01/04/19   [provider]  mupirocin ointment (BACTROBAN) 2 % Apply topically 3 (three) times daily for 7 days. Apply to affected area 3 times daily 01/17/19 01/24/19  Alessandra BevelsKamineni, Neelima, MD  potassium chloride SA (K-DUR) 20 MEQ tablet Take 1 tablet (20 mEq total) by mouth 2 (two) times daily. Patient not taking: Reported on 01/15/2019 11/30/18   Nicanor AlconPalumbo, April, MD    Family History Family History  Problem Relation Age of Onset  . Hypertension Other   . Diabetes Other     Social History Social History   Tobacco Use  . Smoking status: Current Every Day Smoker    Packs/day: 1.00    Years: 20.00    Pack years: 20.00    Types: Cigarettes  . Smokeless tobacco: Current User  Substance Use Topics  . Alcohol use: Yes    Comment: Daily Drinker   . Drug use: Yes    Frequency: 7.0 times per week    Types: "Crack" cocaine, Cocaine, Marijuana    Comment:  Crack     Allergies   Haldol [haloperidol]   Review of Systems Review of Systems  Constitutional: Negative for chills and fever.  Respiratory: Positive for shortness of breath.   Cardiovascular: Positive for leg swelling.  Gastrointestinal: Negative for abdominal pain, nausea and vomiting.  Genitourinary: Positive for penile swelling.     Physical Exam Updated Vital Signs BP (!) 177/107   Pulse (!) 106   Temp 97.9 F (36.6 C) (Oral)   Resp (!) 22   Ht 5\' 9"  (1.753 m)   Wt 104 kg   SpO2 94%   BMI 33.86 kg/m   Physical Exam Vitals signs and nursing note reviewed.  Constitutional:      Appearance: Normal appearance. He is obese.  HENT:     Head: Normocephalic and atraumatic.     Nose: No congestion.     Mouth/Throat:     Pharynx: No oropharyngeal exudate or posterior oropharyngeal erythema.  Neck:     Musculoskeletal:  Neck supple.     Vascular: JVD present.   Cardiovascular:     Rate and Rhythm: Normal rate.     Pulses: Normal pulses.  Pulmonary:     Effort: Pulmonary effort is normal.     Breath sounds: Rales present. No wheezing.  Abdominal:     General: There is distension.  Musculoskeletal:     Right lower leg: 2+ Pitting Edema present.     Left lower leg: 2+ Pitting Edema present.  Neurological:     Mental Status: He is alert.      ED Treatments / Results  Labs (all labs ordered are listed, but only abnormal results are displayed) Labs Reviewed  CBC WITH DIFFERENTIAL/PLATELET - Abnormal; Notable for the following components:      Result Value   RBC 3.96 (*)    Hemoglobin 8.7 (*)    HCT 30.4 (*)    MCV 76.8 (*)    MCH 22.0 (*)    MCHC 28.6 (*)    RDW 20.9 (*)    nRBC 0.4 (*)    All other components within normal limits  BASIC METABOLIC PANEL - Abnormal; Notable for the following components:   Glucose, Bld 211 (*)    All other components within normal limits  BRAIN NATRIURETIC PEPTIDE - Abnormal; Notable for the following components:   B  Natriuretic Peptide 1,185.1 (*)    All other components within normal limits    EKG None  Radiology Dg Chest Portable 1 View  Result Date: 01/19/2019 CLINICAL DATA:  Chest pain and shortness of breath. EXAM: PORTABLE CHEST 1 VIEW COMPARISON:  Single-view of the chest 01/16/2019, 01/15/2019 and 11/17/2018. FINDINGS: There is cardiomegaly. Pulmonary edema is new since the most recent examination. Small bilateral pleural effusions are present, larger on the left. Atelectasis in the lingula and left lung base noted. No pneumothorax. Aortic atherosclerosis. No acute or focal bony abnormality. IMPRESSION: Cardiomegaly and pulmonary edema with associated small pleural effusions, greater on the left. Electronically Signed   By: Drusilla Kannerhomas  Dalessio M.D.   On: 01/19/2019 07:55    Procedures Procedures (including critical care time)  Medications Ordered in ED Medications  furosemide (LASIX) injection 40 mg (40 mg Intravenous Given 01/19/19 0913)     Initial Impression / Assessment and Plan / ED Course  I have reviewed the triage vital signs and the nursing notes.  Pertinent labs & imaging results that were available during my care of the patient were reviewed by me and considered in my medical decision making (see chart for details).       Patient seen in the ED for his 103rd visit in the past 6 months, presents via EMS with complaints of worsening leg swelling.  Patient is currently homeless.  Was admitted to the hospital on 718 due to worsening swelling, patient ultimately left AMA.  Arrived in the ED with bilateral 2 + pitting edema, oxygenation around 89% on room air.  Belly appears distended.  JVD present on exam.  Will obtain laboratory screening, along with given IV Lasix to help diurese him.  He is noncompliant with medication.  CBC showed no leukocytosis, hemoglobin slightly decreased from his previous visit.  BMP showed no electrolyte derangement, creatinine level is unremarkable.  BNP  was 1185.1, elevated but consistent with his previous visits.    X-ray of his chest showed: Cardiomegaly and pulmonary edema with associated small pleural  effusions, greater on the left.      9:36 AM Spoke to Dr.  Katrinka BlazingSmith who will evaluate and admitted for further management of his acute exacerbation of heart failure.    Due to patient's worsening swelling, increased work of breathing likely suspect he will need admission for further management due to his CHF.  Will place call for hospitalist admission.  Final Clinical Impressions(s) / ED Diagnoses   Final diagnoses:  Acute on chronic systolic heart failure [facility](HCC)    ED Discharge Orders    None       Claude MangesSoto, Traivon Morrical, PA-C 01/19/19 [id number]    Loren RacerYelverton, David, MD 01/22/19 2207

## 2019-01-19 NOTE — Clinical Social Work Note (Signed)
CSW acknowledges consult for multiple rehospitalizations and homelessness. Patient is very well known to the [facility] department. He has been offered resources on numerous times which he frequently declines. He has income and typically pays for a hotel once he leaves the hospital. If patient is interested in resources, please consult CSW again.  [name], [place]

## 2019-01-19 NOTE — ED Notes (Addendum)
ED TO INPATIENT HANDOFF REPORT  ED Nurse Name and Phone #: Thurmond Butts [place] Name/Age/Gender [name] 58 y.o. male Room/Bed: 038C/038C  Code Status   Code Status: Full Code  Home/SNF/Other Home Patient oriented to: self, place, time and situation Is this baseline? Yes   Triage Complete: Triage complete  Chief Complaint multi complaints  Triage Note BIB [facility] from street. C/O of continued swelling.    Allergies Allergies  Allergen Reactions  . Haldol [Haloperidol] Other (See Comments)    Muscle spasms, loss of voluntary movement. However, pt has taken Thorazine on multiple occasions with no adverse effects.     Level of Care/Admitting Diagnosis ED Disposition    ED Disposition Condition Comment   Admit  Hospital Area: [facility] [[id number]]  Level of Care: Telemetry Cardiac [103]  Covid Evaluation: Asymptomatic Screening Protocol (No Symptoms)  Diagnosis: Combined systolic and diastolic heart failure [facility]) [010272]  Admitting Physician: Norval Morton [[id number]]  Attending Physician: Norval Morton [[id number]]  Estimated length of stay: past midnight tomorrow  Certification:: I certify this patient will need inpatient services for at least 2 midnights  PT Class (Do Not Modify): Inpatient [101]  PT Acc Code (Do Not Modify): Private [1]       B Medical/Surgery History Past Medical History:  Diagnosis Date  . Chronic foot pain   . Cocaine abuse ([place])   . Diabetes mellitus without complication ([place])   . Hepatitis C    unsure   . Homelessness   . Hypertension   . Neuropathy   . Polysubstance abuse ([place])   . Schizophrenia ([place])   . Sleep apnea   . Systolic and diastolic CHF, chronic ([place])    Past Surgical History:  Procedure Laterality Date  . MULTIPLE TOOTH EXTRACTIONS       A IV Location/Drains/Wounds Patient Lines/Drains/Airways Status   Active Line/Drains/Airways    Name:   Placement date:   Placement time:   Site:    Days:   Peripheral IV 01/19/19 Right Antecubital   01/19/19    0716    Antecubital   less than 1   Pressure Injury 11/07/18 Stage II -  Partial thickness loss of dermis presenting as a shallow open ulcer with a red, pink wound bed without slough. small dime sized area   11/07/18    1532     73   Wound / Incision (Open or Dehisced) 01/02/19 Other (Comment) Penis Left;Anterior yellow base, painful   01/02/19    1822    Penis   17          Intake/Output Last 24 hours No intake or output data in the 24 hours ending 01/19/19 1001  Labs/Imaging Results for orders placed or performed during the hospital encounter of 01/19/19 (from the past 48 hour(s))  CBC with Differential     Status: Abnormal   Collection Time: 01/19/19  7:18 AM  Result Value Ref Range   WBC 9.2 4.0 - 10.5 K/uL   RBC 3.96 (L) 4.22 - 5.81 MIL/uL   Hemoglobin 8.7 (L) 13.0 - 17.0 g/dL    Comment: Reticulocyte Hemoglobin testing may be clinically indicated, consider ordering this additional test [id number]    HCT 30.4 (L) 39.0 - 52.0 %   MCV 76.8 (L) 80.0 - 100.0 fL   MCH 22.0 (L) 26.0 - 34.0 pg   MCHC 28.6 (L) 30.0 - 36.0 g/dL   RDW 20.9 (H) 11.5 - 15.5 %   Platelets  366 150 - 400 K/uL   nRBC 0.4 (H) 0.0 - 0.2 %   Neutrophils Relative % 77 %   Neutro Abs 7.2 1.7 - 7.7 K/uL   Lymphocytes Relative 10 %   Lymphs Abs 0.9 0.7 - 4.0 K/uL   Monocytes Relative 11 %   Monocytes Absolute 1.0 0.1 - 1.0 K/uL   Eosinophils Relative 0 %   Eosinophils Absolute 0.0 0.0 - 0.5 K/uL   Basophils Relative 1 %   Basophils Absolute 0.1 0.0 - 0.1 K/uL   Immature Granulocytes 1 %   Abs Immature Granulocytes 0.05 0.00 - 0.07 K/uL    Comment: Performed at [facility], [address] [place], [place] [address]  Basic metabolic panel     Status: Abnormal   Collection Time: 01/19/19  7:18 AM  Result Value Ref Range   Sodium 136 135 - 145 mmol/L   Potassium 4.1 3.5 - 5.1 mmol/L   Chloride 100 98 - 111 mmol/L   CO2 26 22 - 32 mmol/L    Glucose, Bld 211 (H) 70 - 99 mg/dL   BUN 15 6 - 20 mg/dL   Creatinine, Ser 1.910.92 0.61 - 1.24 mg/dL   Calcium 8.9 8.9 - 47.810.3 mg/dL   GFR calc non Af Amer >60 >60 mL/min   GFR calc Af Amer >60 >60 mL/min   Anion gap 10 5 - 15    Comment: Performed at [facility], [address] [place], [place] [address]  Brain natriuretic peptide     Status: Abnormal   Collection Time: 01/19/19  7:18 AM  Result Value Ref Range   B Natriuretic Peptide 1,185.1 (H) 0.0 - 100.0 pg/mL    Comment: Performed at [facility], [address] [place], [place] [address]   Dg Chest Portable 1 View  Result Date: 01/19/2019 CLINICAL DATA:  Chest pain and shortness of breath. EXAM: PORTABLE CHEST 1 VIEW COMPARISON:  Single-view of the chest 01/16/2019, 01/15/2019 and 11/17/2018. FINDINGS: There is cardiomegaly. Pulmonary edema is new since the most recent examination. Small bilateral pleural effusions are present, larger on the left. Atelectasis in the lingula and left lung base noted. No pneumothorax. Aortic atherosclerosis. No acute or focal bony abnormality. IMPRESSION: Cardiomegaly and pulmonary edema with associated small pleural effusions, greater on the left. Electronically Signed   By: Drusilla Kannerhomas  Dalessio M.D.   On: 01/19/2019 07:55    Pending Labs Unresulted Labs (From admission, onward)    Start     Ordered   01/20/19 0500  Basic metabolic panel  Daily,   R     01/19/19 0959   01/20/19 0500  CBC WITH DIFFERENTIAL  Tomorrow morning,   R     01/19/19 0959   01/19/19 0940  SARS Coronavirus 2 (CEPHEID - Performed in [facility] hospital lab), Hosp Order  (Asymptomatic Patients Labs)  Once,   STAT    Question:  Rule Out  Answer:  Yes   01/19/19 0939          Vitals/Pain Today's Vitals   01/19/19 0640 01/19/19 0730 01/19/19 0801 01/19/19 1000  BP: (!) 164/109 (!) 177/107  (!) 156/102  Pulse: (!) 104 (!) 106  (!) 108  Resp: 20 (!) 22  (!) 22  Temp: 97.9 F (36.6 C)     TempSrc: Oral      SpO2: 92% 94%  94%  Weight:      Height:      PainSc:  Asleep     Isolation Precautions No active isolations  Medications Medications  sodium chloride flush (NS) 0.9 % injection 3 mL (has no administration in time range)  sodium chloride flush (NS) 0.9 % injection 3 mL (has no administration in time range)  0.9 %  sodium chloride infusion (has no administration in time range)  acetaminophen (TYLENOL) tablet 650 mg (has no administration in time range)  ondansetron (ZOFRAN) injection 4 mg (has no administration in time range)  enoxaparin (LOVENOX) injection 40 mg (has no administration in time range)  furosemide (LASIX) injection 40 mg (has no administration in time range)  insulin aspart (novoLOG) injection 0-15 Units (has no administration in time range)  insulin aspart (novoLOG) injection 0-5 Units (has no administration in time range)  furosemide (LASIX) injection 40 mg (40 mg Intravenous Given 01/19/19 0913)    Mobility walks Low fall risk   Focused Assessments    R Recommendations: See Admitting Provider Note  Report given to: Report given to 3E RN  Additional Notes:

## 2019-01-20 ENCOUNTER — Encounter (HOSPITAL_COMMUNITY): Payer: Self-pay | Admitting: General Practice

## 2019-01-20 LAB — BASIC METABOLIC PANEL
Anion gap: 8 (ref 5–15)
BUN: 16 mg/dL (ref 6–20)
CO2: 30 mmol/L (ref 22–32)
Calcium: 8.8 mg/dL — ABNORMAL LOW (ref 8.9–10.3)
Chloride: 102 mmol/L (ref 98–111)
Creatinine, Ser: 0.81 mg/dL (ref 0.61–1.24)
GFR calc Af Amer: >60 mL/min (ref >60)
GFR calc non Af Amer: >60 mL/min (ref >60)
Glucose, Bld: 189 mg/dL — ABNORMAL HIGH (ref 70–99)
Potassium: 3.8 mmol/L (ref 3.5–5.1)
Sodium: 140 mmol/L (ref 135–145)

## 2019-01-20 LAB — CBC WITH DIFFERENTIAL/PLATELET
Abs Immature Granulocytes: 0.02 10*3/uL (ref 0.00–0.07)
Basophils Absolute: 0.1 10*3/uL (ref 0.0–0.1)
Basophils Relative: 1 %
Eosinophils Absolute: 0.1 10*3/uL (ref 0.0–0.5)
Eosinophils Relative: 2 %
HCT: 29.8 % — ABNORMAL LOW (ref 39.0–52.0)
Hemoglobin: 8.6 g/dL — ABNORMAL LOW (ref 13.0–17.0)
Immature Granulocytes: 0 %
Lymphocytes Relative: 15 %
Lymphs Abs: 1 10*3/uL (ref 0.7–4.0)
MCH: 22 pg — ABNORMAL LOW (ref 26.0–34.0)
MCHC: 28.9 g/dL — ABNORMAL LOW (ref 30.0–36.0)
MCV: 76.2 fL — ABNORMAL LOW (ref 80.0–100.0)
Monocytes Absolute: 0.8 10*3/uL (ref 0.1–1.0)
Monocytes Relative: 12 %
Neutro Abs: 4.4 10*3/uL (ref 1.7–7.7)
Neutrophils Relative %: 70 %
Platelets: 344 10*3/uL (ref 150–400)
RBC: 3.91 MIL/uL — ABNORMAL LOW (ref 4.22–5.81)
RDW: 20.8 % — ABNORMAL HIGH (ref 11.5–15.5)
WBC: 6.3 10*3/uL (ref 4.0–10.5)
nRBC: 0.3 % — ABNORMAL HIGH (ref 0.0–0.2)

## 2019-01-20 LAB — GLUCOSE, CAPILLARY
Glucose-Capillary: 177 mg/dL — ABNORMAL HIGH (ref 70–99)
Glucose-Capillary: 250 mg/dL — ABNORMAL HIGH (ref 70–99)
Glucose-Capillary: 253 mg/dL — ABNORMAL HIGH (ref 70–99)

## 2019-01-20 MED ORDER — LISINOPRIL 20 MG PO TABS: 20.0000 mg | ORAL_TABLET | Freq: Every day | ORAL | Status: DC

## 2019-01-20 MED ORDER — LISINOPRIL 10 MG PO TABS
10.0000 mg | ORAL_TABLET | Freq: Once | ORAL | Status: AC
  Administered 2019-01-20: 10 mg via ORAL
  Filled 2019-01-20: qty 1

## 2019-01-20 MED ORDER — FERROUS SULFATE 325 (65 FE) MG PO TABS: 325.0000 mg | ORAL_TABLET | Freq: Two times a day (BID) | ORAL | Status: DC
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
## 2019-01-20 NOTE — Plan of Care (Signed)
°  Problem: Coping: °Goal: Level of anxiety will decrease °Outcome: Progressing °  °

## 2019-01-20 NOTE — Progress Notes (Signed)
Patient  [name] he has to leave, iv and tele removed. MD notified.

## 2019-01-21 ENCOUNTER — Other Ambulatory Visit: Payer: Self-pay

## 2019-01-21 ENCOUNTER — Emergency Department (HOSPITAL_COMMUNITY)
Admission: EM | Admit: 2019-01-21 | Discharge: 2019-01-21 | Disposition: A | Discharge status: Home / Self Care | Payer: Medicaid Other | Attending: Emergency Medicine | Admitting: Emergency Medicine

## 2019-01-21 ENCOUNTER — Emergency Department (HOSPITAL_COMMUNITY)
Admission: EM | Admit: 2019-01-21 | Discharge: 2019-01-21 | Discharge status: Against Medical Advice | Payer: Medicaid Other | Source: Home / Self Care

## 2019-01-21 DIAGNOSIS — N5089 Other specified disorders of the male genital organs: Secondary | ICD-10-CM | POA: Diagnosis not present

## 2019-01-21 DIAGNOSIS — Z590 Homelessness: Secondary | ICD-10-CM | POA: Insufficient documentation

## 2019-01-21 DIAGNOSIS — F17210 Nicotine dependence, cigarettes, uncomplicated: Secondary | ICD-10-CM | POA: Diagnosis not present

## 2019-01-21 DIAGNOSIS — I5042 Chronic combined systolic (congestive) and diastolic (congestive) heart failure: Secondary | ICD-10-CM | POA: Diagnosis not present

## 2019-01-21 DIAGNOSIS — Z79899 Other long term (current) drug therapy: Secondary | ICD-10-CM | POA: Diagnosis not present

## 2019-01-21 DIAGNOSIS — Z7984 Long term (current) use of oral hypoglycemic drugs: Secondary | ICD-10-CM | POA: Diagnosis not present

## 2019-01-21 DIAGNOSIS — E1122 Type 2 diabetes mellitus with diabetic chronic kidney disease: Secondary | ICD-10-CM | POA: Diagnosis not present

## 2019-01-21 DIAGNOSIS — R52 Pain, unspecified: Secondary | ICD-10-CM | POA: Diagnosis not present

## 2019-01-21 DIAGNOSIS — R0602 Shortness of breath: Secondary | ICD-10-CM | POA: Diagnosis present

## 2019-01-21 DIAGNOSIS — I130 Hypertensive heart and chronic kidney disease with heart failure and stage 1 through stage 4 chronic kidney disease, or unspecified chronic kidney disease: Secondary | ICD-10-CM | POA: Diagnosis not present

## 2019-01-21 DIAGNOSIS — N189 Chronic kidney disease, unspecified: Secondary | ICD-10-CM | POA: Insufficient documentation

## 2019-01-21 MED ORDER — ACETAMINOPHEN 325 MG PO TABS
650.0000 mg | ORAL_TABLET | Freq: Once | ORAL | Status: AC
  Administered 2019-01-21: 650 mg via ORAL
  Filled 2019-01-21: qty 2

## 2019-01-21 MED ORDER — ACETAMINOPHEN 325 MG PO TABS: 650.0000 mg | ORAL_TABLET | Freq: Once | ORAL | Status: DC | PRN

## 2019-01-21 NOTE — ED Notes (Signed)
No answer for vitals  

## 2019-01-21 NOTE — ED Triage Notes (Signed)
Pt. Arrived via EMS with co of SOB. VS: 162/100, HR102, 100%15Lo2, CBG 301, 18G IV Left AC.

## 2019-01-21 NOTE — ED Notes (Signed)
Patient verbalizes understanding of discharge instructions. Opportunity for questioning and answers were provided. Armband removed by staff, pt discharged from ED.  

## 2019-01-21 NOTE — Discharge Instructions (Addendum)
Please utilize the resources already provided for essentials like shelter, food and shower.

## 2019-01-21 NOTE — ED Triage Notes (Signed)
Pt here for bilateral leg swelling, swelling to groin. Pt reports he was sent by [facility]. Pt reports he was given an antibiotic for the swelling to his groin.

## 2019-01-21 NOTE — ED Provider Notes (Signed)
[place] EMERGENCY DEPARTMENT Provider Note   CSN: [id number] Arrival date & time: 01/21/19  0636     History   Chief Complaint Chief Complaint  Patient presents with  . Shortness of Breath    HPI [name] is a 58 y.o. male.     HPI  58 year old with history of diabetes, homelessness, polysubstance abuse and CHF comes in a chief complaint of shortness of breath and pain. He reports to me that the main reason he is in the ER today is because he is having generalized pain.  He also states that he is having difficulty with privacy and taking care of himself because of swelling in his legs and scrotum.  He admits to taking his medications as prescribed.  Shortness of breath is exertional.  Patient is also requesting pain medication since he is having pain all over his body.  Currently he is not residing at a shelter and is requesting help with food and clothing.  Past Medical History:  Diagnosis Date  . Chronic foot pain   . Cocaine abuse ([place])   . Diabetes mellitus without complication ([place])   . Hepatitis C    unsure   . Homelessness   . Hypertension   . Neuropathy   . Polysubstance abuse ([place])   . Schizophrenia ([place])   . Sleep apnea   . Systolic and diastolic CHF, chronic [facility])     Patient Active Problem List   Diagnosis Date Noted  . Combined systolic and diastolic heart failure ([place]) 01/19/2019  . Chest pain 01/16/2019  . 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  . Medically noncompliant   . Acute on chronic systolic CHF (congestive heart failure) ([place]) 01/02/2019  . Acute on chronic combined systolic (congestive) and diastolic (congestive) heart failure ([place]) 01/02/2019  . Acute on chronic systolic heart failure ([place]) 12/26/2018  . Mobitz type II atrioventricular block 12/25/2018  . Acute exacerbation of CHF (congestive heart failure) ([place]) 12/22/2018  . Acute on chronic respiratory failure with hypoxia ([place]) 12/22/2018  . CHF exacerbation ([place])  12/19/2018  . Acute CHF (congestive heart failure) ([place]) 12/16/2018  . Diabetes mellitus without complication ([place]) 32/99/2426  . Acute on chronic systolic (congestive) heart failure ([place]) 12/11/2018  . Abdominal pain 12/11/2018  . Nausea vomiting and diarrhea 12/11/2018  . Microcytic anemia 12/11/2018  . Evaluation by psychiatric service required   . MDD (major depressive disorder), severe ([place]) 11/25/2018  . Pressure injury of skin 11/08/2018  . Elevated troponin 10/18/2018  . HLD (hyperlipidemia) 10/18/2018  . Anxiety 10/18/2018  . CHF (congestive heart failure), NYHA class II, acute on chronic, combined ([place]) 10/18/2018  . Hypertensive urgency 10/12/2018  . Mild renal insufficiency 10/12/2018  . Cellulitis 10/12/2018  . Penile cellulitis   . Adjustment disorder with mixed disturbance of emotions and conduct   . Sleep apnea 10/03/2018  . Hypokalemia 09/17/2018  . Scrotal swelling 09/17/2018  . Urinary hesitancy   . Constipation   . Acute on chronic combined systolic and diastolic CHF (congestive heart failure) ([place]) 08/25/2018  . Tobacco use 08/18/2018  . Homelessness 08/08/2018  . Smoker 08/08/2018  . Prostate enlargement 03/16/2018  . Aortic atherosclerosis ([place]) 03/16/2018  . 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 Chronic foot pain   . Schizoaffective disorder, bipolar type ([place]) 09/30/2016  . Substance induced mood disorder ([place]) 03/13/2015  . Schizophrenia, paranoid type ([place]) 01/17/2015  . Drug hallucinosis ([place]) 10/08/2014  . Chronic paranoid schizophrenia ([place]) 09/07/2014  . Substance  or medication-induced bipolar and related disorder with onset during intoxication ([facility]) 08/10/2014  . Urinary retention   . Cocaine use disorder, severe, dependence ([facility])   . Essential hypertension 03/28/2013  . 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 MULTIPLE TOOTH EXTRACTIONS           Home Medications    Prior to Admission medications   Medication Sig Start Date End Date Taking? Authorizing Provider  acetaminophen (TYLENOL) 500 MG tablet Take 1 tablet (500 mg total) by mouth every 6 (six) hours as needed. Patient not taking: Reported on 01/19/2019 01/08/19   Fayrene Helperran, [place], PA-C  ARIPiprazole (ABILIFY) 10 MG tablet Take 1 tablet (10 mg total) by mouth daily. Patient not taking: Reported on 01/13/2019 12/10/18   Joycelyn DasPokhrel, [place], MD  carbamazepine (TEGRETOL) 200 MG tablet Take 0.5 tablets (100 mg total) by mouth 2 (two) times daily. Patient not taking: Reported on 01/15/2019 11/26/18   Mancel BaleWentz, Elliott, MD  cefdinir (OMNICEF) 300 MG capsule Take 1 capsule (300 mg total) by mouth 2 (two) times daily for 7 days. Patient not taking: Reported on 01/19/2019 01/17/19 01/24/19  Alessandra BevelsKamineni, Neelima, MD  furosemide (LASIX) 40 MG tablet Take 1 tablet (40 mg total) by mouth 2 (two) times daily. Patient not taking: Reported on 01/19/2019 01/17/19 02/16/19  Alessandra BevelsKamineni, Neelima, MD  glipiZIDE (GLUCOTROL) 5 MG tablet Take 0.5 tablets (2.5 mg total) by mouth 2 (two) times daily before a meal. Patient not taking: Reported on 01/19/2019 01/01/19   Rai, Delene Ruffiniipudeep K, MD  Ipratropium-Albuterol (COMBIVENT) 20-100 MCG/ACT AERS respimat Inhale 1 puff into the lungs every 6 (six) hours. Patient not taking: Reported on 01/15/2019 11/30/18   Palumbo, April, MD  lisinopril (ZESTRIL) 10 MG tablet Take 1 tablet (10 mg total) by mouth daily. Patient not taking: Reported on 01/15/2019 11/26/18   Mancel BaleWentz, Elliott, MD  mupirocin ointment (BACTROBAN) 2 % Apply topically 3 (three) times daily for 7 days. Apply to affected area 3 times daily Patient not taking: Reported on 01/19/2019 01/17/19 01/24/19  Alessandra BevelsKamineni, Neelima, MD  potassium chloride SA (K-DUR) 20 MEQ tablet Take 1 tablet (20 mEq total) by mouth 2 (two) times daily. Patient not taking: Reported on 01/15/2019 11/30/18   Nicanor AlconPalumbo, April, MD    Family History Family History   Problem Relation Age of Onset  . Hypertension Other   . Diabetes Other     Social History Social History   Tobacco Use  . Smoking status: Current Every Day Smoker    Packs/day: 1.00    Years: 20.00    Pack years: 20.00    Types: Cigarettes  . Smokeless tobacco: Current User  Substance Use Topics  . Alcohol use: Yes    Comment: Daily Drinker   . Drug use: Yes    Frequency: 7.0 times per week    Types: "Crack" cocaine, Cocaine, Marijuana    Comment: Crack     Allergies   Haldol [haloperidol]   Review of Systems Review of Systems  Respiratory: Positive for shortness of breath.   Musculoskeletal: Positive for myalgias.  All other systems reviewed and are negative.    Physical Exam Updated Vital Signs Temp (!) 96.8 F (36 C) (Oral)   Physical Exam Vitals signs and nursing note reviewed.  Constitutional:      Appearance: He is well-developed.  HENT:     Head: Atraumatic.  Neck:     Musculoskeletal: Neck supple.  Cardiovascular:  Rate and Rhythm: Normal rate.  Pulmonary:     Effort: Pulmonary effort is normal. No tachypnea, accessory muscle usage or respiratory distress.     Breath sounds: No decreased breath sounds.  Genitourinary:    Comments: Scrotal edema Musculoskeletal:     Right lower leg: Edema present.     Left lower leg: Edema present.  Skin:    General: Skin is warm.  Neurological:     Mental Status: He is alert and oriented to person, place, and time.      ED Treatments / Results  Labs (all labs ordered are listed, but only abnormal results are displayed) Labs Reviewed - No data to display  EKG None  Radiology No results found.  Procedures Procedures (including critical care time)  Medications Ordered in ED Medications  acetaminophen (TYLENOL) tablet 650 mg (has no administration in time range)     Initial Impression / Assessment and Plan / ED Course  I have reviewed the triage vital signs and the nursing notes.   Pertinent labs & imaging results that were available during my care of the patient were reviewed by me and considered in my medical decision making (see chart for details).       58 year old comes in with multiple complaints. He has multiple medical comorbidities and also a lot of social barriers due to him being homeless and having psych illness. Clinically there is no respiratory distress.  Lungs are clear and his edema actually looks better than it has in the past.  He stable for discharge.  Patient has been provided with multiple psychiatry and social work resources.  We reinforced appropriate utilization of the ER and outpatient services.  Final Clinical Impressions(s) / ED Diagnoses   Final diagnoses:  Generalized pain  Scrotal edema    ED Discharge Orders    None       Derwood KaplanNanavati, Cecylia Brazill, MD 01/21/19 [id number]

## 2019-01-21 NOTE — ED Notes (Signed)
No answer x2 for vitals

## 2019-01-22 ENCOUNTER — Emergency Department (HOSPITAL_COMMUNITY): Payer: Medicaid Other

## 2019-01-22 ENCOUNTER — Encounter (HOSPITAL_COMMUNITY): Payer: Self-pay | Admitting: Emergency Medicine

## 2019-01-22 ENCOUNTER — Other Ambulatory Visit: Payer: Self-pay

## 2019-01-22 ENCOUNTER — Emergency Department (HOSPITAL_COMMUNITY)
Admission: EM | Admit: 2019-01-22 | Discharge: 2019-01-22 | Disposition: A | Discharge status: Home / Self Care | Payer: Medicaid Other | Attending: Emergency Medicine | Admitting: Emergency Medicine

## 2019-01-22 DIAGNOSIS — R0602 Shortness of breath: Secondary | ICD-10-CM | POA: Insufficient documentation

## 2019-01-22 DIAGNOSIS — N485 Ulcer of penis: Secondary | ICD-10-CM | POA: Diagnosis not present

## 2019-01-22 DIAGNOSIS — Z5900 Homelessness unspecified: Secondary | ICD-10-CM

## 2019-01-22 DIAGNOSIS — R52 Pain, unspecified: Secondary | ICD-10-CM | POA: Diagnosis not present

## 2019-01-22 LAB — CBC WITH DIFFERENTIAL/PLATELET
Abs Immature Granulocytes: 0.03 10*3/uL (ref 0.00–0.07)
Basophils Absolute: 0 10*3/uL (ref 0.0–0.1)
Basophils Relative: 1 %
Eosinophils Absolute: 0 10*3/uL (ref 0.0–0.5)
Eosinophils Relative: 1 %
HCT: 29.4 % — ABNORMAL LOW (ref 39.0–52.0)
Hemoglobin: 8.4 g/dL — ABNORMAL LOW (ref 13.0–17.0)
Immature Granulocytes: 0 %
Lymphocytes Relative: 10 %
Lymphs Abs: 0.7 10*3/uL (ref 0.7–4.0)
MCH: 21.8 pg — ABNORMAL LOW (ref 26.0–34.0)
MCHC: 28.6 g/dL — ABNORMAL LOW (ref 30.0–36.0)
MCV: 76.4 fL — ABNORMAL LOW (ref 80.0–100.0)
Monocytes Absolute: 0.9 10*3/uL (ref 0.1–1.0)
Monocytes Relative: 12 %
Neutro Abs: 5.6 10*3/uL (ref 1.7–7.7)
Neutrophils Relative %: 76 %
Platelets: 365 10*3/uL (ref 150–400)
RBC: 3.85 MIL/uL — ABNORMAL LOW (ref 4.22–5.81)
RDW: 21 % — ABNORMAL HIGH (ref 11.5–15.5)
WBC: 7.4 10*3/uL (ref 4.0–10.5)
nRBC: 0.4 % — ABNORMAL HIGH (ref 0.0–0.2)

## 2019-01-22 LAB — BASIC METABOLIC PANEL
Anion gap: 8 (ref 5–15)
BUN: 21 mg/dL — ABNORMAL HIGH (ref 6–20)
CO2: 27 mmol/L (ref 22–32)
Calcium: 8.6 mg/dL — ABNORMAL LOW (ref 8.9–10.3)
Chloride: 105 mmol/L (ref 98–111)
Creatinine, Ser: 1.12 mg/dL (ref 0.61–1.24)
GFR calc Af Amer: >60 mL/min (ref >60)
GFR calc non Af Amer: >60 mL/min (ref >60)
Glucose, Bld: 236 mg/dL — ABNORMAL HIGH (ref 70–99)
Potassium: 4.3 mmol/L (ref 3.5–5.1)
Sodium: 140 mmol/L (ref 135–145)

## 2019-01-22 LAB — BRAIN NATRIURETIC PEPTIDE: B Natriuretic Peptide: 1100.6 pg/mL — ABNORMAL HIGH (ref 0.0–100.0)

## 2019-01-22 LAB — CBG MONITORING, ED: Glucose-Capillary: 234 mg/dL — ABNORMAL HIGH (ref 70–99)

## 2019-01-22 MED ORDER — ACETAMINOPHEN 500 MG PO TABS
1000.0000 mg | ORAL_TABLET | Freq: Once | ORAL | Status: AC
  Administered 2019-01-22: 1000 mg via ORAL
  Filled 2019-01-22: qty 2

## 2019-01-22 NOTE — Discharge Summary (Signed)
Discharge Summary  [name]:[id number] DOB: Jul 06, 1960  PCP: Patient, No Pcp Per  Admit date: 01/19/2019 Discharge date: 01/22/2019  Time spent: 25 minutes   Recommendations for Outpatient Follow-up:  1. Left against medical advice  Discharge Diagnoses:  Active Hospital Problems   Diagnosis Date Noted   Combined systolic and diastolic heart failure (HCC) 01/19/2019   Acute on chronic respiratory failure with hypoxia (HCC) 12/22/2018   Microcytic anemia 12/11/2018   Cellulitis 10/12/2018   Scrotal swelling 09/17/2018   Schizoaffective disorder, bipolar type (HCC) 09/30/2016   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/10/2014   Insulin-requiring or dependent type II diabetes mellitus (HCC) 03/15/2013    Resolved Hospital Problems  No resolved problems to display.    Vitals:   01/20/19 0836 01/20/19 1204  BP: (!) 167/127 (!) 175/95  Pulse: 97 (!) 101  Resp: 20 (!) 22  Temp: 98.2 F (36.8 C) (!) 97.4 F (36.3 C)  SpO2: 95% 93%    History of present illness:  [name] a 58 y.o.malewith medical history significant ofcombined congestive heart failure EF 25 to 30%, insulin-dependent diabetes mellitus type 2, schizophrenia, homelessness, sleep apnea, cocaine abuse,and noncompliance; who presents with complaints of swelling.He reports that he has been having worsening swelling from his legs all the way up into his abdomen. Complains thathead which is been biting him in his private causing him pain in his testicles. Patient was just recently admitted in the hospital on 7/18 for congestive heart failure exacerbation. Since being discharged he reports using cocaine every day. Other associated symptoms include reports of, bitehis lower lip, darkening discoloration of bilateral lower extremities, and shortness of breath with exertion. Patient has had several ED visits and  admissions previously leaving AMA. He currently denies having any chest pain symptoms.   Blood pressure 163/99-177/107, respirations elevated at 29, and O2 saturations 92 to 99% on room air. Labs revealed hemoglobin 8.7, BUN 15, creatinine 0.92, glucose 211, and BNP 1185.1(slightly lower than previousof1390.9 on 7/18). Chest x-ray showing cardiomegaly with pulmonary edema and small pleural effusions greater on the left. Patient was given 40 mg of Lasix IV. [facility] called to admit.  01/20/19: Patient was seen and examined at his bedside.  He is somnolent but easily arousable to voices.  Denies any chest pain or dyspnea at rest.  Left AGAINST MEDICAL ADVICE.   Hospital Course:  Principal Problem:   Combined systolic and diastolic heart failure (HCC) Active Problems:   Insulin-requiring or dependent type II diabetes mellitus (HCC)   Substance or medication-induced bipolar and related disorder with onset during intoxication (HCC)   Schizoaffective disorder, bipolar type (HCC)   Scrotal swelling   Cellulitis   Microcytic anemia   Acute on chronic respiratory failure with hypoxia (HCC)  Acute on chronic combined systolic and diastolic CHF Presented with elevated BNP greater than 1300 Last 2D echo done on May 2020 revealed LVEF 25 to 30% Continue diuresing Currently on IV Lasix 40 mg twice daily Net I&O -1.3 L since admission Monitor renal function and urine output Salt restriction less than 2 g daily  Scrotal edema and cellulitis Treated on multiple presentation but not compliant with medication once he leaves AMA Currently on cefdinir, continue Afebrile with no leukocytosis  Chronic microcytic anemia Hemoglobin 8.6 with MCV of 76 No sign of overt bleeding Iron studies remarkable for significant iron deficiency Start ferrous sulfate 325 mg twice daily  Uncontrolled hypertension/tachycardia On lisinopril 10 mg  daily at home and p.o. Lasix 40 mg twice daily at home Increase  lisinopril dose to 20 mg daily Continue to closely monitor vital signs  Physical debility/ambulatory dysfunction PT assessed and recommended SNF Fall precautions CSW assisting with discharge planning  Type 2 diabetes with hyperglycemia Last hemoglobin A1c 9.7 on 12/04/2018 Continue to hold oral anti-glycemic's Continue insulin coverage  Polysubstance abuse including cocaine Continue counseling for polysubstance abuse cessation at bedside  Schizophrenia Continue Abilify Tegretol  AV block Patient noted to have high-grade AV block during prior admission for which she was evaluated by EP cardiology, not a candidate for intervention. Continue to avoid AV nodal blockers Monitor on telemetry  Risks: Patient is high risk for decompensation due to acute on chronic combined diastolic and systolic CHF, multiple comorbidities and advanced age.  Patient will require at least 2 midnights for further assessment and treatment of present condition.   DVT prophylaxis:Lovenox subcu daily. Code Status:Full Family Communication:Contact not available by phone. Disposition Plan: Possible discharge to SNF in 1 to 2 days when hemodynamically stable.  Still requires IV diuretics. Consults called:None Admission status:Inpatient   Discharge Exam: BP (!) 175/95 (BP Location: Left Arm)    Pulse (!) 101    Temp (!) 97.4 F (36.3 C) (Oral)    Resp (!) 22    Ht 5\' 9"  (1.753 m)    Wt 105.6 kg Comment: scale a   SpO2 93%    BMI 34.38 kg/m   General: 58 y.o. year-old male well developed well nourished in no acute distress.  Somnolent but easily arousable to voices.  Cardiovascular: Regular rate and rhythm with no rubs or gallops.  No thyromegaly or JVD noted.    Respiratory: Mild rales at bases with no wheezes.  Poor inspiratory effort.  Abdomen: Soft nontender nondistended with normal bowel sounds x4 quadrants.  Musculoskeletal: Trace lower extremity edema bilaterally with  hyperpigmentation.   Psychiatry: Mood is appropriate for condition and setting   Discharge Instructions You were cared for by a hospitalist during your hospital stay. If you have any questions about your discharge medications or the care you received while you were in the hospital after you are discharged, you can call the unit and asked to speak with the hospitalist on call if the hospitalist that took care of you is not available. Once you are discharged, your primary care physician will handle any further medical issues. Please note that NO REFILLS for any discharge medications will be authorized once you are discharged, as it is imperative that you return to your primary care physician (or establish a relationship with a primary care physician if you do not have one) for your aftercare needs so that they can reassess your need for medications and monitor your lab values.   Allergies as of 01/20/2019      Reactions   Haldol [haloperidol] Other (See Comments)   Muscle spasms, loss of voluntary movement. However, pt has taken Thorazine on multiple occasions with no adverse effects.       Medication List    ASK your doctor about these medications   acetaminophen 500 MG tablet Commonly known as: TYLENOL Take 1 tablet (500 mg total) by mouth every 6 (six) hours as needed.   ARIPiprazole 10 MG tablet Commonly known as: Abilify Take 1 tablet (10 mg total) by mouth daily.   carbamazepine 200 MG tablet Commonly known as: TEGRETOL Take 0.5 tablets (100 mg total) by mouth 2 (two) times daily.   cefdinir 300  MG capsule Commonly known as: OMNICEF Take 1 capsule (300 mg total) by mouth 2 (two) times daily for 7 days.   furosemide 40 MG tablet Commonly known as: LASIX Take 1 tablet (40 mg total) by mouth 2 (two) times daily.   glipiZIDE 5 MG tablet Commonly known as: GLUCOTROL Take 0.5 tablets (2.5 mg total) by mouth 2 (two) times daily before a meal.   Ipratropium-Albuterol 20-100  MCG/ACT Aers respimat Commonly known as: COMBIVENT Inhale 1 puff into the lungs every 6 (six) hours.   lisinopril 10 MG tablet Commonly known as: ZESTRIL Take 1 tablet (10 mg total) by mouth daily.   mupirocin ointment 2 % Commonly known as: BACTROBAN Apply topically 3 (three) times daily for 7 days. Apply to affected area 3 times daily   potassium chloride SA 20 MEQ tablet Commonly known as: K-DUR Take 1 tablet (20 mEq total) by mouth 2 (two) times daily.      Allergies  Allergen Reactions   Haldol [Haloperidol] Other (See Comments)    Muscle spasms, loss of voluntary movement. However, pt has taken Thorazine on multiple occasions with no adverse effects.       The results of significant diagnostics from this hospitalization (including imaging, microbiology, ancillary and laboratory) are listed below for reference.    Significant Diagnostic Studies: Ct Pelvis W Contrast  Result Date: 01/01/2019 CLINICAL DATA:  58 y/o M; swelling of genitals for 8 months. Cellulitis of corpus cavernosum and penis. EXAM: CT PELVIS WITH CONTRAST TECHNIQUE: Multidetector CT imaging of the pelvis was performed using the standard protocol following the bolus administration of intravenous contrast. CONTRAST:  OMNIPAQUE IOHEXOL 300 MG/ML  SOLN COMPARISON:  None. FINDINGS: Urinary Tract:  Mild bladder wall thickening. Bowel:  Included bowel is unremarkable. Vascular/Lymphatic: Calcific atherosclerosis of visible iliofemoral arteries which are patent. Reproductive: Prostate enlargement measuring 6.1 x 6.3 x 6.6 cm (volume = 130 cm^3). Diffuse edema of the scrotal wall and superficial soft tissues of the penis. No discrete rim enhancing collection. Other:  Small to moderate volume of peritoneal ascites and anasarca. Musculoskeletal: No acute fracture. IMPRESSION: 1. Diffuse edema of the scrotal wall and superficial soft tissues of penis. No discrete rim enhancing collection. Findings may represent  cellulitis or be secondary to volume overload. 2. Small to moderate volume of peritoneal ascites and anasarca. 3. Prostate enlargement. 4. Mild bladder wall thickening, possibly chronic outflow obstruction or cystitis. Electronically Signed   By: Mitzi Hansen M.D.   On: 01/01/2019 01:26   Dg Chest Portable 1 View  Result Date: 01/19/2019 CLINICAL DATA:  Chest pain and shortness of breath. EXAM: PORTABLE CHEST 1 VIEW COMPARISON:  Single-view of the chest 01/16/2019, 01/15/2019 and 11/17/2018. FINDINGS: There is cardiomegaly. Pulmonary edema is new since the most recent examination. Small bilateral pleural effusions are present, larger on the left. Atelectasis in the lingula and left lung base noted. No pneumothorax. Aortic atherosclerosis. No acute or focal bony abnormality. IMPRESSION: Cardiomegaly and pulmonary edema with associated small pleural effusions, greater on the left. Electronically Signed   By: Drusilla Kanner M.D.   On: 01/19/2019 07:55   Dg Chest Portable 1 View  Result Date: 01/16/2019 CLINICAL DATA:  LEFT knee pain and chest pain. EXAM: PORTABLE CHEST 1 VIEW COMPARISON:  Chest x-ray dated 01/15/2019 and 01/14/2019. FINDINGS: Stable cardiomegaly. Mild central pulmonary vascular congestion. No confluent opacity to suggest a developing pneumonia. No pleural effusion or pneumothorax seen. Osseous structures about the chest are unremarkable. IMPRESSION: Cardiomegaly with mild  central pulmonary vascular congestion suggesting mild CHF/volume overload. No evidence of pneumonia or overt alveolar pulmonary edema. Electronically Signed   By: Bary RichardStan  Maynard M.D.   On: 01/16/2019 04:20   Dg Chest Portable 1 View  Result Date: 01/15/2019 CLINICAL DATA:  Low-grade fever which shortness-of-breath. EXAM: PORTABLE CHEST 1 VIEW COMPARISON:  01/14/2019 and 11/13/2018 FINDINGS: Lungs are adequately inflated with linear atelectasis/scarring over the left midlung. No airspace consolidation or  effusion. Stable cardiomegaly. Remainder of the exam is unchanged. IMPRESSION: No acute cardiopulmonary disease. Stable cardiomegaly. Linear atelectasis/scarring left midlung. Electronically Signed   By: Elberta Fortisaniel  Boyle M.D.   On: 01/15/2019 15:45   Dg Chest Portable 1 View  Result Date: 01/14/2019 CLINICAL DATA:  Edema.  Heart failure. EXAM: PORTABLE CHEST 1 VIEW COMPARISON:  01/13/2019 and multiple previous FINDINGS: Chronic cardiomegaly. Pulmonary venous hypertension with mild interstitial edema. No infiltrate, collapse or effusion. No acute bone finding. Metallic foreign object in the right anterior chest. IMPRESSION: Cardiomegaly, pulmonary venous hypertension and mild interstitial edema. Electronically Signed   By: Paulina FusiMark  Shogry M.D.   On: 01/14/2019 14:30   Dg Chest Portable 1 View  Result Date: 01/13/2019 CLINICAL DATA:  Shortness of breath.  Possible fluid overload EXAM: PORTABLE CHEST 1 VIEW COMPARISON:  01/02/2019 FINDINGS: Chronic cardiomegaly. There is no edema, consolidation, effusion, or pneumothorax. Mild scarring in the left mid chest. IMPRESSION: No acute finding.  Baseline appearance of the chest. Electronically Signed   By: Marnee SpringJonathon  Watts M.D.   On: 01/13/2019 10:40   Dg Chest Port 1 View  Result Date: 01/02/2019 CLINICAL DATA:  Cough, shortness of breath, leg swelling. EXAM: PORTABLE CHEST 1 VIEW COMPARISON:  Chest x-ray dated December 31, 2018. FINDINGS: Stable cardiomegaly. Unchanged mild pulmonary vascular congestion. Unchanged trace bilateral pleural effusions and mild bibasilar atelectasis. No consolidation or pneumothorax. No acute osseous abnormality. IMPRESSION: 1. Unchanged mild pulmonary vascular congestion and trace bilateral pleural effusions. Electronically Signed   By: Obie DredgeWilliam T Derry M.D.   On: 01/02/2019 08:23   Dg Chest Port 1 View  Result Date: 12/31/2018 CLINICAL DATA:  Shortness of breath. EXAM: PORTABLE CHEST 1 VIEW COMPARISON:  Radiograph of December 25, 2018. FINDINGS:  Stable cardiomegaly with central pulmonary vascular congestion is noted. No pneumothorax is noted. Mild bibasilar subsegmental atelectasis is noted with minimal pleural effusions. Bony thorax is unremarkable. IMPRESSION: Stable cardiomegaly with central pulmonary vascular congestion. Mild bibasilar subsegmental atelectasis is noted with minimal pleural effusions. Electronically Signed   By: Lupita RaiderJames  Green Jr M.D.   On: 12/31/2018 18:23   Dg Chest Port 1 View  Result Date: 12/25/2018 CLINICAL DATA:  58 year old male with shortness of breath EXAM: PORTABLE CHEST 1 VIEW COMPARISON:  Chest radiograph dated 12/22/2018 FINDINGS: There is cardiomegaly with vascular congestion and probable small bilateral pleural effusions. There are bibasilar atelectasis. Pneumonia is not excluded. There is no pneumothorax. No acute osseous pathology. Stable small radiopaque foreign object over the right diaphragm. IMPRESSION: Cardiomegaly with findings of CHF and small bilateral pleural effusions, slightly worsened since the prior radiograph. Pneumonia is not excluded. Electronically Signed   By: Elgie CollardArash  Radparvar M.D.   On: 12/25/2018 19:44   [place] Ascites (abdomen Limited)  Result Date: 01/03/2019 CLINICAL DATA:  Abdominal distension. EXAM: LIMITED ABDOMEN ULTRASOUND FOR ASCITES TECHNIQUE: Limited ultrasound survey for ascites was performed in all four abdominal quadrants. COMPARISON:  None. FINDINGS: Mild amount of fluid is noted in the left lower quadrant and right upper quadrant. Mild-to-moderate fluid is noted in the right lower  quadrant. Probable mild fluid is noted in left upper quadrant. IMPRESSION: Mild to moderate ascites is noted. Electronically Signed   By: Marijo Conception M.D.   On: 01/03/2019 09:39    Microbiology: Recent Results (from the past 240 hour(s))  SARS Coronavirus 2 (CEPHEID - Performed in [place] hospital lab), Hosp Order     Status: None   Collection Time: 01/16/19  4:50 AM   Specimen:  Nasopharyngeal Swab  Result Value Ref Range Status   SARS Coronavirus 2 NEGATIVE NEGATIVE Final    Comment: (NOTE) If result is NEGATIVE SARS-CoV-2 target nucleic acids are NOT DETECTED. The SARS-CoV-2 RNA is generally detectable in upper and lower  respiratory specimens during the acute phase of infection. The lowest  concentration of SARS-CoV-2 viral copies this assay can detect is 250  copies / mL. A negative result does not preclude SARS-CoV-2 infection  and should not be used as the sole basis for treatment or other  patient management decisions.  A negative result may occur with  improper specimen collection / handling, submission of specimen other  than nasopharyngeal swab, presence of viral mutation(s) within the  areas targeted by this assay, and inadequate number of viral copies  (<250 copies / mL). A negative result must be combined with clinical  observations, patient history, and epidemiological information. If result is POSITIVE SARS-CoV-2 target nucleic acids are DETECTED. The SARS-CoV-2 RNA is generally detectable in upper and lower  respiratory specimens dur ing the acute phase of infection.  Positive  results are indicative of active infection with SARS-CoV-2.  Clinical  correlation with patient history and other diagnostic information is  necessary to determine patient infection status.  Positive results do  not rule out bacterial infection or co-infection with other viruses. If result is PRESUMPTIVE POSTIVE SARS-CoV-2 nucleic acids MAY BE PRESENT.   A presumptive positive result was obtained on the submitted specimen  and confirmed on repeat testing.  While 2019 novel coronavirus  (SARS-CoV-2) nucleic acids may be present in the submitted sample  additional confirmatory testing may be necessary for epidemiological  and / or clinical management purposes  to differentiate between  SARS-CoV-2 and other Sarbecovirus currently known to infect humans.  If clinically  indicated additional testing with an alternate test  methodology [phone]) is advised. The SARS-CoV-2 RNA is generally  detectable in upper and lower respiratory sp ecimens during the acute  phase of infection. The expected result is Negative. Fact Sheet for Patients:  StrictlyIdeas.no Fact Sheet for Healthcare Providers: BankingDealers.co.za This test is not yet approved or cleared by the [place] FDA and has been authorized for detection and/or diagnosis of SARS-CoV-2 by FDA under an Emergency Use Authorization (EUA).  This EUA will remain in effect (meaning this test can be used) for the duration of the COVID-19 declaration under Section 564(b)(1) of the Act, 21 U.S.C. section 360bbb-3(b)(1), unless the authorization is terminated or revoked sooner. Performed at [facility], [place] [address], [place], [place] [address]   SARS Coronavirus 2 (CEPHEID - Performed in [place] hospital lab), Hosp Order     Status: None   Collection Time: 01/19/19  9:42 AM   Specimen: Nasopharyngeal Swab  Result Value Ref Range Status   SARS Coronavirus 2 NEGATIVE NEGATIVE Final    Comment: (NOTE) If result is NEGATIVE SARS-CoV-2 target nucleic acids are NOT DETECTED. The SARS-CoV-2 RNA is generally detectable in upper and lower  respiratory specimens during the acute phase of infection. The lowest  concentration of SARS-CoV-2 viral copies this assay can detect is 250  copies / mL. A negative result does not preclude SARS-CoV-2 infection  and should not be used as the sole basis for treatment or other  patient management decisions.  A negative result may occur with  improper specimen collection / handling, submission of specimen other  than nasopharyngeal swab, presence of viral mutation(s) within the  areas targeted by this assay, and inadequate number of viral copies  (<250 copies / mL). A negative result must be combined with clinical    observations, patient history, and epidemiological information. If result is POSITIVE SARS-CoV-2 target nucleic acids are DETECTED. The SARS-CoV-2 RNA is generally detectable in upper and lower  respiratory specimens dur ing the acute phase of infection.  Positive  results are indicative of active infection with SARS-CoV-2.  Clinical  correlation with patient history and other diagnostic information is  necessary to determine patient infection status.  Positive results do  not rule out bacterial infection or co-infection with other viruses. If result is PRESUMPTIVE POSTIVE SARS-CoV-2 nucleic acids MAY BE PRESENT.   A presumptive positive result was obtained on the submitted specimen  and confirmed on repeat testing.  While 2019 novel coronavirus  (SARS-CoV-2) nucleic acids may be present in the submitted sample  additional confirmatory testing may be necessary for epidemiological  and / or clinical management purposes  to differentiate between  SARS-CoV-2 and other Sarbecovirus currently known to infect humans.  If clinically indicated additional testing with an alternate test  methodology [phone]) is advised. The SARS-CoV-2 RNA is generally  detectable in upper and lower respiratory sp ecimens during the acute  phase of infection. The expected result is Negative. Fact Sheet for Patients:  BoilerBrush.com.cy Fact Sheet for Healthcare Providers: https://pope.com/ This test is not yet approved or cleared by the [place] FDA and has been authorized for detection and/or diagnosis of SARS-CoV-2 by FDA under an Emergency Use Authorization (EUA).  This EUA will remain in effect (meaning this test can be used) for the duration of the COVID-19 declaration under Section 564(b)(1) of the Act, 21 U.S.C. section 360bbb-3(b)(1), unless the authorization is terminated or revoked sooner. Performed at [facility], [address]  [place], [place] [address]      Labs: Basic Metabolic Panel: Recent Labs  Lab 01/16/19 0352 01/17/19 0328 01/19/19 0718 01/20/19 0405  NA 137 140 136 140  K 3.9 3.8 4.1 3.8  CL 104 102 100 102  CO2 GLUCOSE 232* 177* 211* 189*  BUN CREATININE 0.91 0.92 0.92 0.81  CALCIUM 8.8* 8.9 8.9 8.8*   Liver Function Tests: No results for input(s): AST, ALT, ALKPHOS, BILITOT, PROT, ALBUMIN in the last 168 hours. No results for input(s): LIPASE, AMYLASE in the last 168 hours. No results for input(s): AMMONIA in the last 168 hours. CBC: Recent Labs  Lab 01/16/19 0352 01/19/19 0718 01/20/19 0405  WBC 9.0 9.2 6.3  NEUTROABS  --  7.2 4.4  HGB 8.8* 8.7* 8.6*  HCT 30.8* 30.4* 29.8*  MCV 77.0* 76.8* 76.2*  PLT 325 366 344   Cardiac Enzymes: No results for input(s): CKTOTAL, CKMB, CKMBINDEX, TROPONINI in the last 168 hours. BNP: BNP (last 3 results) Recent Labs    01/14/19 1328 01/16/19 0352 01/19/19 0718  BNP 1,282.4* 1,390.9* 1,185.1*    ProBNP (last 3 results) No results for input(s): PROBNP in the last 8760 hours.  CBG: Recent Labs  Lab 01/19/19 1609 01/19/19 2117 01/20/19 0627 01/20/19 0811 01/20/19 1110  GLUCAP 242* 172* 177* 250* 253*       Signed:  Darlin Droparole N Campbell Agramonte, MD [facility] 01/22/2019, 3:36 PM

## 2019-01-22 NOTE — ED Provider Notes (Addendum)
[facility]   CSN: [id number] Arrival date & time: 01/22/19  1613    History   Chief Complaint Chief Complaint  Patient presents with  . Shortness of Breath    HPI [name] is a 58 y.o. male.     HPI  Patient is a 58 year old male with past medical history of diabetes, homelessness, CHF, polysubstance abuse who presents to the emergency department today via [place] with reported shortness of breath and bilateral lower extremity edema.  EMS reported patient's initial oxygen saturation on room air was in the mid 80s.  They placed the patient on a nonrebreather at 15 L/min and his oxygen saturation improved to 100%  The patient is reporting pain all over, and immediately stated "doc I just need some Tylenol and a Sprite."  He denied any current chest pain, palpitations.  He denied any acute shortness of breath, and says that his breathing (currently) is at his baseline and is not any worse than normal for him.  He does report pain to bilateral feet/ankles and is requesting "peroxide."  He is also reporting penile pain, he does state that the swelling to his groin region, which is known and has been evaluated on multiple recent ED visits and hospitalizations, is actually improved from where it had been previously, but that his current pain in that region is on his penis itself.  He denied any new or recent sexual encounters.  He does endorse using cocaine several hours ago.  Past Medical History:  Diagnosis Date  . Chronic foot pain   . Cocaine abuse (HCC)   . Diabetes mellitus without complication (HCC)   . Hepatitis C    unsure   . Homelessness   . Hypertension   . Neuropathy   . Polysubstance abuse (HCC)   . Schizophrenia (HCC)   . Sleep apnea   . Systolic and diastolic CHF, chronic [facility](HCC)     Patient Active Problem List   Diagnosis Date Noted  . Combined systolic and diastolic heart failure (HCC)  84/13/244007/21/2020  . Chest pain 01/16/2019  . 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  . Medically noncompliant   . Acute on chronic systolic CHF (congestive heart failure) (HCC) 01/02/2019  . 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 Acute on chronic systolic heart failure (HCC) 12/26/2018  . Mobitz type II atrioventricular block 12/25/2018  . Acute exacerbation of CHF (congestive heart failure) (HCC) 12/22/2018  . Acute on chronic respiratory failure with hypoxia (HCC) 12/22/2018  . CHF exacerbation (HCC) 12/19/2018  . Acute CHF (congestive heart failure) (HCC) 12/16/2018  . Diabetes mellitus without complication (HCC) 12/11/2018  . Acute on chronic systolic (congestive) heart failure (HCC) 12/11/2018  . Abdominal pain 12/11/2018  . Nausea vomiting and diarrhea 12/11/2018  . Microcytic anemia 12/11/2018  . Evaluation by psychiatric service required   . MDD (major depressive disorder), severe (HCC) 11/25/2018  . Pressure injury of skin 11/08/2018  . Elevated troponin 10/18/2018  . HLD (hyperlipidemia) 10/18/2018  . Anxiety 10/18/2018  . CHF (congestive heart failure), NYHA class II, acute on chronic, combined (HCC) 10/18/2018  . Hypertensive urgency 10/12/2018  . Mild renal insufficiency 10/12/2018  . Cellulitis 10/12/2018  . Penile cellulitis   . Adjustment disorder with mixed disturbance of emotions and conduct   . Sleep apnea 10/03/2018  . Hypokalemia 09/17/2018  . Scrotal swelling 09/17/2018  . Urinary hesitancy   .  Constipation   . Acute on chronic combined systolic and diastolic CHF (congestive heart failure) (HCC) 08/25/2018  . Tobacco use 08/18/2018  . Homelessness 08/08/2018  . Smoker 08/08/2018  . Prostate enlargement 03/16/2018  . Aortic atherosclerosis (HCC) 03/16/2018  . 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 Chronic foot pain   . Schizoaffective disorder, bipolar type (HCC) 09/30/2016  . Substance induced mood  disorder (HCC) 03/13/2015  . Schizophrenia, paranoid type (HCC) 01/17/2015  . Drug hallucinosis (HCC) 10/08/2014  . Chronic paranoid schizophrenia (HCC) 09/07/2014  . 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 Urinary retention   . Cocaine use disorder, severe, dependence (HCC)   . Essential hypertension 03/28/2013  . 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 MULTIPLE TOOTH EXTRACTIONS          Home Medications    Prior to Admission medications   Medication Sig Start Date End Date Taking? Authorizing Provider  acetaminophen (TYLENOL) 500 MG tablet Take 1 tablet (500 mg total) by mouth every 6 (six) hours as needed. Patient not taking: Reported on 01/19/2019 01/08/19   Fayrene Helper, PA-C  ARIPiprazole (ABILIFY) 10 MG tablet Take 1 tablet (10 mg total) by mouth daily. Patient not taking: Reported on 01/13/2019 12/10/18   Joycelyn Das, MD  carbamazepine (TEGRETOL) 200 MG tablet Take 0.5 tablets (100 mg total) by mouth 2 (two) times daily. Patient not taking: Reported on 01/15/2019 11/26/18   Mancel Bale, MD  cefdinir (OMNICEF) 300 MG capsule Take 1 capsule (300 mg total) by mouth 2 (two) times daily for 7 days. Patient not taking: Reported on 01/19/2019 01/17/19 01/24/19  Alessandra Bevels, MD  furosemide (LASIX) 40 MG tablet Take 1 tablet (40 mg total) by mouth 2 (two) times daily. Patient not taking: Reported on 01/19/2019 01/17/19 02/16/19  Alessandra Bevels, MD  glipiZIDE (GLUCOTROL) 5 MG tablet Take 0.5 tablets (2.5 mg total) by mouth 2 (two) times daily before a meal. Patient not taking: Reported on 01/19/2019 01/01/19   Rai, Delene Ruffini, MD  Ipratropium-Albuterol (COMBIVENT) 20-100 MCG/ACT AERS respimat Inhale 1 puff into the lungs every 6 (six) hours. Patient not taking: Reported on 01/15/2019 11/30/18   Palumbo, April, MD  lisinopril  (ZESTRIL) 10 MG tablet Take 1 tablet (10 mg total) by mouth daily. Patient not taking: Reported on 01/15/2019 11/26/18   Mancel Bale, MD  mupirocin ointment (BACTROBAN) 2 % Apply topically 3 (three) times daily for 7 days. Apply to affected area 3 times daily Patient not taking: Reported on 01/19/2019 01/17/19 01/24/19  Alessandra Bevels, MD  potassium chloride SA (K-DUR) 20 MEQ tablet Take 1 tablet (20 mEq total) by mouth 2 (two) times daily. Patient not taking: Reported on 01/15/2019 11/30/18   Nicanor Alcon, April, MD    Family History Family History  Problem Relation Age of Onset  . Hypertension Other   . Diabetes Other     Social History Social History   Tobacco Use  . Smoking status: Current Every Day Smoker    Packs/day: 1.00    Years: 20.00    Pack years: 20.00    Types: Cigarettes  . Smokeless tobacco: Current User  Substance Use Topics  . Alcohol use: Yes    Comment: Daily Drinker   . Drug use: Yes    Frequency: 7.0 times per week    Types: "Crack" cocaine, Cocaine, Marijuana    Comment:  Crack     Allergies   Haldol [haloperidol]   Review of Systems Review of Systems  Constitutional: Negative for activity change, appetite change, chills and fever.  HENT: Negative for ear pain and sore throat.   Eyes: Negative for pain and visual disturbance.  Respiratory: Positive for shortness of breath (chronic, at his baseline). Negative for cough.   Cardiovascular: Positive for leg swelling (bilateral, chronic). Negative for chest pain and palpitations.  Gastrointestinal: Negative for abdominal pain and vomiting.  Genitourinary: Positive for penile pain and scrotal swelling (says it is currently improved). Negative for discharge, dysuria, hematuria and penile swelling.  Musculoskeletal: Negative for arthralgias and back pain.  Skin: Positive for wound (to his penis). Negative for color change and rash.  Neurological: Negative for seizures and syncope.  Psychiatric/Behavioral:  Positive for agitation.  All other systems reviewed and are negative.    Physical Exam Updated Vital Signs BP (!) 150/99   Pulse (!) 110   Temp 99.5 F (37.5 C) (Oral)   Resp (!) 25   Ht 5\' 9"  (1.753 m)   Wt 104.3 kg   SpO2 100%   BMI 33.97 kg/m   Physical Exam Vitals signs and nursing note reviewed.  Constitutional:      Appearance: He is well-developed. He is not ill-appearing.  HENT:     Head: Normocephalic and atraumatic.     Right Ear: External ear normal.     Left Ear: External ear normal.  Eyes:     Conjunctiva/sclera: Conjunctivae normal.  Neck:     Musculoskeletal: Neck supple.  Cardiovascular:     Rate and Rhythm: Normal rate and regular rhythm.     Heart sounds: No murmur.  Pulmonary:     Effort: Pulmonary effort is normal. No respiratory distress.     Comments: Diminished throughout, but rales appreciated in the right base and mid lung fields.  Abdominal:     General: Abdomen is protuberant.     Palpations: Abdomen is soft. There is no shifting dullness or fluid wave.     Tenderness: There is no abdominal tenderness.  Genitourinary:    Penis: Circumcised.        Comments: Scrotal swelling.  Skin ulceration noted to the left, lateral aspect of the mid shaft of the penis. It is tender to palpation  Musculoskeletal:     Right lower leg: Edema present.     Left lower leg: Edema present.  Skin:    General: Skin is warm and dry.  Neurological:     Mental Status: He is alert.      ED Treatments / Results  Labs (all labs ordered are listed, but only abnormal results are displayed) Labs Reviewed  BRAIN NATRIURETIC PEPTIDE - Abnormal; Notable for the following components:      Result Value   B Natriuretic Peptide 1,100.6 (*)    All other components within normal limits  CBC WITH DIFFERENTIAL/PLATELET - Abnormal; Notable for the following components:   RBC 3.85 (*)    Hemoglobin 8.4 (*)    HCT 29.4 (*)    MCV 76.4 (*)    MCH 21.8 (*)    MCHC  28.6 (*)    RDW 21.0 (*)    nRBC 0.4 (*)    All other components within normal limits  BASIC METABOLIC PANEL - Abnormal; Notable for the following components:   Glucose, Bld 236 (*)    BUN 21 (*)    Calcium 8.6 (*)    All other  components within normal limits  CBG MONITORING, ED - Abnormal; Notable for the following components:   Glucose-Capillary 234 (*)    All other components within normal limits    EKG EKG Interpretation  Date/Time:  Friday January 22 2019 16:37:18 EDT Ventricular Rate:  107 PR Interval:    QRS Duration: 78 QT Interval:  337 QTC Calculation: 450 R Axis:   64 Text Interpretation:  Sinus tachycardia Anterior infarct, old Minimal ST depression, inferior leads since last tracing no significant change Confirmed by Eber HongMiller, Brian ([id number]) on 01/22/2019 4:56:00 PM   Radiology Dg Chest Portable 1 View  Result Date: 01/22/2019 CLINICAL DATA:  Shortness of breath and chest pain for 1 year EXAM: PORTABLE CHEST 1 VIEW COMPARISON:  01/19/2019 FINDINGS: Cardiac shadow is enlarged but stable. Aortic calcifications are again seen. Mild vascular congestion is noted but improved when compared with the prior exam. Small left pleural effusion is noted as well as left midlung atelectatic changes. IMPRESSION: Small left pleural effusion and stable left midlung atelectasis. Vascular congestion but improved when compared with the prior study. Electronically Signed   By: Alcide CleverMark  Lukens M.D.   On: 01/22/2019 17:00    Procedures Procedures (including critical care time)  Medications Ordered in ED Medications  acetaminophen (TYLENOL) tablet 1,000 mg (1,000 mg Oral Given 01/22/19 1703)     Initial Impression / Assessment and Plan / ED Course  I have reviewed the triage vital signs and the nursing notes.  Pertinent labs & imaging results that were available during my care of the patient were reviewed by me and considered in my medical decision making (see chart for details).  Of note,  this patient was evaluated in the Emergency Department for the symptoms described in the history of present illness. He was evaluated in the context of the global COVID-19 pandemic, which necessitated consideration that the patient might be at risk for infection with the SARS-CoV-2 virus that causes COVID-19. Institutional protocols and algorithms that pertain to the evaluation of patients at risk for COVID-19 are in a state of rapid change based on information released by regulatory bodies including the CDC and federal and state organizations. These policies and algorithms were followed during the patient's care in the ED.  During this patient encounter, the patient was wearing a mask, and throughout this encounter I was wearing at least a surgical mask.  I was not within 6 feet of this patient for more than 15 minutes without eye protection when they were not wearing mask.   EM Physician interpretation of Labs & Imaging: . Patient's BNP is elevated at 1100, however this is actually less than values obtained during the last 3 visits to the ED. Marland Kitchen. CBC with chronic anemia with Hgb 8.4, HCT 29.4.  This is at patient's baseline compared to multiple prior visits. . Patient with hyperglycemia, glucose 236.  Medical Decision Making:  [name] is a 58 y.o. male with multiple concerns, who is well-known to the staff of this emergency department, with greater than 150 visits this year including an admission just 3 days ago for volume overload/heart failure exacerbation in which the patient left AGAINST MEDICAL ADVICE. He presented to the emergency department today via EMS for reports of shortness of breath, however patient denied acute shortness of breath.  He primarily with concerns for pain to his penis in which a small area of ulceration was noted, Xeroform gauze was placed over this region.  Not consistent with pain was primary chancre, he  has had this noted on exam before and is a fairly recent RPR  which was negative.  Patient not in any objective shortness of breath here, he was transitioned from nonrebreather to nasal cannula immediately on arrival and on nasal cannula maintained oxygen saturations in the upper 90s without any tachypnea.  As described above he also reported that his respiratory effort and subjective feelings of ability to breathe were at his baseline.  He maintained good oxygen saturations on room air for several hours in the emergency department.  After receiving acetaminophen he reports significant improvement in his generalized pain.    Following receiving his acetaminophen and a Sprite, as requested, the patient said "I am ready to go, I got my Tylenol and sprite." He did remain in the ED though until his labs resulted and were reviewed.   After review of his laboratory work-up, imaging and in conjunction with his HPI and physical examination, I do not believe he has acute heart failure exacerbation at this time, he also has no evidence of acute pneumonia, and has no objective shortness of breath here in the emergency department.  He was discharged to home/self in stable condition.   The plan for this patient was discussed with my attending physician, Dr. Noemi Chapel, who voiced agreement and who oversaw evaluation and treatment of this patient.   CLINICAL IMPRESSION: 1. Generalized pain   2. Penile ulcer   3. Homelessness      Disposition: Discharge  Key discharge instructions: Strict return precautions provided. Patient was encouraged to return to the ED should they experience worsening or persistence of current symptoms, or should they develop new concerning symptoms. Encouraged them to f/u with their PCP on an outpatient basis. Questions regarding the diagnosis were answered, and side effects regarding therapies were provided in writing or orally. Patient discharged in stable condition.   Latish Toutant A. Jimmye Norman, MD Resident Physician, PGY-3 Emergency Medicine  [facility]    Jefm Petty, MD 01/22/19 Langston Reusing    Noemi Chapel, MD 01/23/19 1324

## 2019-01-22 NOTE — ED Triage Notes (Signed)
[place]. C/c of SOB and bilateral lower extremity swelling with abd distention. Oxygen in mid 80s upon ems arrival put on NRB at 15 L/min. Came up to 100 Tachy @110  BP 162/104 CBG 288 T 97.9

## 2019-01-23 ENCOUNTER — Encounter (HOSPITAL_COMMUNITY): Payer: Self-pay | Admitting: Emergency Medicine

## 2019-01-23 ENCOUNTER — Other Ambulatory Visit: Payer: Self-pay

## 2019-01-23 ENCOUNTER — Emergency Department (HOSPITAL_COMMUNITY)
Admission: EM | Admit: 2019-01-23 | Discharge: 2019-01-24 | Disposition: A | Discharge status: Home / Self Care | Payer: Medicaid Other | Attending: Emergency Medicine | Admitting: Emergency Medicine

## 2019-01-23 DIAGNOSIS — Z590 Homelessness: Secondary | ICD-10-CM | POA: Insufficient documentation

## 2019-01-23 DIAGNOSIS — F1914 Other psychoactive substance abuse with psychoactive substance-induced mood disorder: Secondary | ICD-10-CM | POA: Insufficient documentation

## 2019-01-23 DIAGNOSIS — N182 Chronic kidney disease, stage 2 (mild): Secondary | ICD-10-CM | POA: Diagnosis not present

## 2019-01-23 DIAGNOSIS — Z20828 Contact with and (suspected) exposure to other viral communicable diseases: Secondary | ICD-10-CM | POA: Insufficient documentation

## 2019-01-23 DIAGNOSIS — R45851 Suicidal ideations: Secondary | ICD-10-CM

## 2019-01-23 DIAGNOSIS — F1490 Cocaine use, unspecified, uncomplicated: Secondary | ICD-10-CM | POA: Insufficient documentation

## 2019-01-23 DIAGNOSIS — F17210 Nicotine dependence, cigarettes, uncomplicated: Secondary | ICD-10-CM | POA: Diagnosis not present

## 2019-01-23 DIAGNOSIS — I130 Hypertensive heart and chronic kidney disease with heart failure and stage 1 through stage 4 chronic kidney disease, or unspecified chronic kidney disease: Secondary | ICD-10-CM | POA: Insufficient documentation

## 2019-01-23 DIAGNOSIS — F209 Schizophrenia, unspecified: Secondary | ICD-10-CM | POA: Insufficient documentation

## 2019-01-23 DIAGNOSIS — I5042 Chronic combined systolic (congestive) and diastolic (congestive) heart failure: Secondary | ICD-10-CM | POA: Diagnosis not present

## 2019-01-23 DIAGNOSIS — E1122 Type 2 diabetes mellitus with diabetic chronic kidney disease: Secondary | ICD-10-CM | POA: Diagnosis not present

## 2019-01-23 DIAGNOSIS — F1994 Other psychoactive substance use, unspecified with psychoactive substance-induced mood disorder: Secondary | ICD-10-CM | POA: Diagnosis present

## 2019-01-23 DIAGNOSIS — Z7984 Long term (current) use of oral hypoglycemic drugs: Secondary | ICD-10-CM | POA: Diagnosis not present

## 2019-01-23 DIAGNOSIS — Z008 Encounter for other general examination: Secondary | ICD-10-CM | POA: Diagnosis present

## 2019-01-23 DIAGNOSIS — F1010 Alcohol abuse, uncomplicated: Secondary | ICD-10-CM | POA: Insufficient documentation

## 2019-01-23 LAB — COMPREHENSIVE METABOLIC PANEL
ALT: 11 U/L (ref 0–44)
AST: 21 U/L (ref 15–41)
Albumin: 3 g/dL — ABNORMAL LOW (ref 3.5–5.0)
Alkaline Phosphatase: 167 U/L — ABNORMAL HIGH (ref 38–126)
Anion gap: 12 (ref 5–15)
BUN: 22 mg/dL — ABNORMAL HIGH (ref 6–20)
CO2: 27 mmol/L (ref 22–32)
Calcium: 8.7 mg/dL — ABNORMAL LOW (ref 8.9–10.3)
Chloride: 101 mmol/L (ref 98–111)
Creatinine, Ser: 0.93 mg/dL (ref 0.61–1.24)
GFR calc Af Amer: >60 mL/min (ref >60)
GFR calc non Af Amer: >60 mL/min (ref >60)
Glucose, Bld: 242 mg/dL — ABNORMAL HIGH (ref 70–99)
Potassium: 4.1 mmol/L (ref 3.5–5.1)
Sodium: 140 mmol/L (ref 135–145)
Total Bilirubin: 1 mg/dL (ref 0.3–1.2)
Total Protein: 7.6 g/dL (ref 6.5–8.1)

## 2019-01-23 LAB — RAPID URINE DRUG SCREEN, HOSP PERFORMED
Amphetamines: NOT DETECTED
Barbiturates: NOT DETECTED
Benzodiazepines: NOT DETECTED
Cocaine: POSITIVE — AB
Opiates: NOT DETECTED
Tetrahydrocannabinol: NOT DETECTED

## 2019-01-23 LAB — CBC
HCT: 31.7 % — ABNORMAL LOW (ref 39.0–52.0)
Hemoglobin: 8.9 g/dL — ABNORMAL LOW (ref 13.0–17.0)
MCH: 22.1 pg — ABNORMAL LOW (ref 26.0–34.0)
MCHC: 28.1 g/dL — ABNORMAL LOW (ref 30.0–36.0)
MCV: 78.9 fL — ABNORMAL LOW (ref 80.0–100.0)
Platelets: 367 10*3/uL (ref 150–400)
RBC: 4.02 MIL/uL — ABNORMAL LOW (ref 4.22–5.81)
RDW: 21.2 % — ABNORMAL HIGH (ref 11.5–15.5)
WBC: 8.1 10*3/uL (ref 4.0–10.5)
nRBC: 0.2 % (ref 0.0–0.2)

## 2019-01-23 LAB — SALICYLATE LEVEL: Salicylate Lvl: <7 mg/dL (ref 2.8–30.0)

## 2019-01-23 LAB — ETHANOL: Alcohol, Ethyl (B): <10 mg/dL (ref <10)

## 2019-01-23 LAB — SARS CORONAVIRUS 2 BY RT PCR (HOSPITAL ORDER, PERFORMED IN ~~LOC~~ HOSPITAL LAB): SARS Coronavirus 2: NEGATIVE

## 2019-01-23 LAB — ACETAMINOPHEN LEVEL: Acetaminophen (Tylenol), Serum: <10 ug/mL — ABNORMAL LOW (ref 10–30)

## 2019-01-23 MED ORDER — POTASSIUM CHLORIDE CRYS ER 20 MEQ PO TBCR
20.0000 meq | EXTENDED_RELEASE_TABLET | Freq: Two times a day (BID) | ORAL | Status: DC
  Administered 2019-01-23 – 2019-01-24 (×3): 20 meq via ORAL
  Filled 2019-01-23 (×3): qty 1

## 2019-01-23 MED ORDER — FUROSEMIDE 40 MG PO TABS
40.0000 mg | ORAL_TABLET | Freq: Two times a day (BID) | ORAL | Status: DC
  Administered 2019-01-23 – 2019-01-24 (×3): 40 mg via ORAL
  Filled 2019-01-23 (×4): qty 1

## 2019-01-23 MED ORDER — IPRATROPIUM-ALBUTEROL 20-100 MCG/ACT IN AERS
1.0000 | INHALATION_SPRAY | Freq: Four times a day (QID) | RESPIRATORY_TRACT | Status: DC
  Administered 2019-01-23 – 2019-01-24 (×5): 1 via RESPIRATORY_TRACT
  Filled 2019-01-23: qty 4

## 2019-01-23 MED ORDER — LISINOPRIL 10 MG PO TABS
10.0000 mg | ORAL_TABLET | Freq: Once | ORAL | Status: AC
  Administered 2019-01-23: 10 mg via ORAL
  Filled 2019-01-23: qty 1

## 2019-01-23 MED ORDER — ARIPIPRAZOLE 10 MG PO TABS
10.0000 mg | ORAL_TABLET | Freq: Every day | ORAL | Status: DC
  Administered 2019-01-23 – 2019-01-24 (×2): 10 mg via ORAL
  Filled 2019-01-23 (×2): qty 1

## 2019-01-23 MED ORDER — ACETAMINOPHEN 325 MG PO TABS
650.0000 mg | ORAL_TABLET | Freq: Four times a day (QID) | ORAL | Status: DC | PRN
  Administered 2019-01-23 – 2019-01-24 (×2): 650 mg via ORAL
  Filled 2019-01-23 (×2): qty 2

## 2019-01-23 MED ORDER — LISINOPRIL 10 MG PO TABS: 10.0000 mg | ORAL_TABLET | Freq: Every day | ORAL | Status: DC

## 2019-01-23 MED ORDER — GLIPIZIDE 2.5 MG HALF TABLET
2.5000 mg | ORAL_TABLET | Freq: Two times a day (BID) | ORAL | Status: DC
  Administered 2019-01-23 – 2019-01-24 (×3): 2.5 mg via ORAL
  Filled 2019-01-23 (×5): qty 1

## 2019-01-23 MED ORDER — LISINOPRIL 10 MG PO TABS
10.0000 mg | ORAL_TABLET | Freq: Every day | ORAL | Status: DC
  Administered 2019-01-24: 10 mg via ORAL
  Filled 2019-01-23: qty 1

## 2019-01-23 MED ORDER — CARBAMAZEPINE 200 MG PO TABS
100.0000 mg | ORAL_TABLET | Freq: Two times a day (BID) | ORAL | Status: DC
  Administered 2019-01-23 – 2019-01-24 (×3): 100 mg via ORAL
  Filled 2019-01-23 (×5): qty 0.5

## 2019-01-23 NOTE — ED Provider Notes (Signed)
[facility]-EMERGENCY DEPT Provider Note   CSN: [id number] Arrival date & time: 01/23/19  0459    History   Chief Complaint Chief Complaint  Patient presents with  . Suicidal  . Alcohol Intoxication  . Addiction Problem    HPI Mellody MemosFrederick L Brewton is a 58 y.o. male.     58 year old male with a history of diabetes, hypertension, systolic and diastolic CHF, schizophrenia, homelessness presents to the emergency department for suicidal ideations.  He states that he is suicidal and having thoughts of killing himself by [place].  Was evaluated less than 24 hours ago in the emergency department for generalized pain and edema.  His work-up was found to be at baseline compared to prior ED evaluations and admissions.  Left the hospital AGAINST MEDICAL ADVICE 3 days ago.  Longstanding history of medication noncompliance.  The history is provided by the patient. No language interpreter was used.  Alcohol Intoxication    Past Medical History:  Diagnosis Date  . Chronic foot pain   . Cocaine abuse (HCC)   . Diabetes mellitus without complication (HCC)   . Hepatitis C    unsure   . Homelessness   . Hypertension   . Neuropathy   . Polysubstance abuse (HCC)   . Schizophrenia (HCC)   . Sleep apnea   . Systolic and diastolic CHF, chronic [facility](HCC)     Patient Active Problem List   Diagnosis Date Noted  . Combined systolic and diastolic heart failure (HCC) 01/19/2019  . Chest pain 01/16/2019  . 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  . Medically noncompliant   . Acute on chronic systolic CHF (congestive heart failure) (HCC) 01/02/2019  . 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 Acute on chronic systolic heart failure (HCC) 12/26/2018  . Mobitz type II atrioventricular block 12/25/2018  . Acute exacerbation of CHF (congestive heart failure) (HCC) 12/22/2018  . Acute on chronic respiratory failure with hypoxia  (HCC) 12/22/2018  . CHF exacerbation (HCC) 12/19/2018  . Acute CHF (congestive heart failure) (HCC) 12/16/2018  . Diabetes mellitus without complication (HCC) 12/11/2018  . Acute on chronic systolic (congestive) heart failure (HCC) 12/11/2018  . Abdominal pain 12/11/2018  . Nausea vomiting and diarrhea 12/11/2018  . Microcytic anemia 12/11/2018  . Evaluation by psychiatric service required   . MDD (major depressive disorder), severe (HCC) 11/25/2018  . Pressure injury of skin 11/08/2018  . Elevated troponin 10/18/2018  . HLD (hyperlipidemia) 10/18/2018  . Anxiety 10/18/2018  . CHF (congestive heart failure), NYHA class II, acute on chronic, combined (HCC) 10/18/2018  . Hypertensive urgency 10/12/2018  . Mild renal insufficiency 10/12/2018  . Cellulitis 10/12/2018  . Penile cellulitis   . Adjustment disorder with mixed disturbance of emotions and conduct   . Sleep apnea 10/03/2018  . Hypokalemia 09/17/2018  . Scrotal swelling 09/17/2018  . Urinary hesitancy   . Constipation   . Acute on chronic combined systolic and diastolic CHF (congestive heart failure) (HCC) 08/25/2018  . Tobacco use 08/18/2018  . Homelessness 08/08/2018  . Smoker 08/08/2018  . Prostate enlargement 03/16/2018  . Aortic atherosclerosis (HCC) 03/16/2018  . 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 Chronic foot pain   . Schizoaffective disorder, bipolar type (HCC) 09/30/2016  . Substance induced mood disorder (HCC) 03/13/2015  . Schizophrenia, paranoid type (HCC) 01/17/2015  . Drug hallucinosis (HCC) 10/08/2014  . Chronic paranoid schizophrenia (HCC) 09/07/2014  . Substance or medication-induced bipolar and related disorder with  onset during intoxication ([place]) 08/10/2014  . Urinary retention   . Cocaine use disorder, severe, dependence ([place])   . Essential hypertension 03/28/2013  . Insulin-requiring or dependent type II diabetes mellitus ([place]) 03/15/2013    Past Surgical History:  Procedure  Laterality Date  . MULTIPLE TOOTH EXTRACTIONS          Home Medications    Prior to Admission medications   Medication Sig Start Date End Date Taking? Authorizing Provider  acetaminophen (TYLENOL) 500 MG tablet Take 1 tablet (500 mg total) by mouth every 6 (six) hours as needed. Patient not taking: Reported on 01/19/2019 01/08/19   Domenic Moras, PA-C  ARIPiprazole (ABILIFY) 10 MG tablet Take 1 tablet (10 mg total) by mouth daily. Patient not taking: Reported on 01/13/2019 12/10/18   Flora Lipps, MD  carbamazepine (TEGRETOL) 200 MG tablet Take 0.5 tablets (100 mg total) by mouth 2 (two) times daily. Patient not taking: Reported on 01/15/2019 11/26/18   Daleen Bo, MD  cefdinir (OMNICEF) 300 MG capsule Take 1 capsule (300 mg total) by mouth 2 (two) times daily for 7 days. Patient not taking: Reported on 01/19/2019 01/17/19 01/24/19  Guilford Shi, MD  furosemide (LASIX) 40 MG tablet Take 1 tablet (40 mg total) by mouth 2 (two) times daily. Patient not taking: Reported on 01/19/2019 01/17/19 02/16/19  Guilford Shi, MD  glipiZIDE (GLUCOTROL) 5 MG tablet Take 0.5 tablets (2.5 mg total) by mouth 2 (two) times daily before a meal. Patient not taking: Reported on 01/19/2019 01/01/19   Rai, Vernelle Emerald, MD  Ipratropium-Albuterol (COMBIVENT) 20-100 MCG/ACT AERS respimat Inhale 1 puff into the lungs every 6 (six) hours. Patient not taking: Reported on 01/15/2019 11/30/18   Palumbo, April, MD  lisinopril (ZESTRIL) 10 MG tablet Take 1 tablet (10 mg total) by mouth daily. Patient not taking: Reported on 01/15/2019 11/26/18   Daleen Bo, MD  mupirocin ointment (BACTROBAN) 2 % Apply topically 3 (three) times daily for 7 days. Apply to affected area 3 times daily Patient not taking: Reported on 01/19/2019 01/17/19 01/24/19  Guilford Shi, MD  potassium chloride SA (K-DUR) 20 MEQ tablet Take 1 tablet (20 mEq total) by mouth 2 (two) times daily. Patient not taking: Reported on 01/15/2019 11/30/18   Randal Buba,  April, MD    Family History Family History  Problem Relation Age of Onset  . Hypertension Other   . Diabetes Other     Social History Social History   Tobacco Use  . Smoking status: Current Every Day Smoker    Packs/day: 1.00    Years: 20.00    Pack years: 20.00    Types: Cigarettes  . Smokeless tobacco: Current User  Substance Use Topics  . Alcohol use: Yes    Comment: Daily Drinker   . Drug use: Yes    Frequency: 7.0 times per week    Types: "Crack" cocaine, Cocaine, Marijuana    Comment: Crack     Allergies   Haldol [haloperidol]   Review of Systems Review of Systems  Unable to perform ROS: Other  History limited due to psychiatric d/o and cooperation.   Physical Exam Updated Vital Signs BP (!) 180/107 (BP Location: Left Arm)   Pulse (!) 107   Temp 98.4 F (36.9 C) (Oral)   Resp 16   Ht 5\' 9"  (1.753 m)   Wt 104.3 kg   SpO2 94%   BMI 33.96 kg/m   Physical Exam Vitals signs and nursing note reviewed.  Constitutional:  General: He is not in acute distress.    Appearance: He is well-developed. He is not diaphoretic.     Comments: Sleeping, snoring. Alert when stimulated, but will easily fall back to sleep.  HENT:     Head: Normocephalic and atraumatic.  Eyes:     General: No scleral icterus.    Conjunctiva/sclera: Conjunctivae normal.  Neck:     Musculoskeletal: Normal range of motion.  Cardiovascular:     Rate and Rhythm: Regular rhythm. Tachycardia present.     Pulses: Normal pulses.     Comments: Mild tachycardia Pulmonary:     Effort: Pulmonary effort is normal. No respiratory distress.     Comments: Oxygen saturations 92-95% on room air Musculoskeletal: Normal range of motion.  Skin:    General: Skin is warm and dry.     Coloration: Skin is not pale.     Findings: No erythema or rash.  Neurological:     Mental Status: He is oriented to person, place, and time.     Comments: Speech is clear, goal oriented.  He moves all  extremities spontaneously.  Psychiatric:     Comments: Passive SI with mild agitation when awake.      ED Treatments / Results  Labs (all labs ordered are listed, but only abnormal results are displayed) Labs Reviewed  COMPREHENSIVE METABOLIC PANEL - Abnormal; Notable for the following components:      Result Value   Glucose, Bld 242 (*)    BUN 22 (*)    Calcium 8.7 (*)    Albumin 3.0 (*)    Alkaline Phosphatase 167 (*)    All other components within normal limits  CBC - Abnormal; Notable for the following components:   RBC 4.02 (*)    Hemoglobin 8.9 (*)    HCT 31.7 (*)    MCV 78.9 (*)    MCH 22.1 (*)    MCHC 28.1 (*)    RDW 21.2 (*)    All other components within normal limits  ACETAMINOPHEN LEVEL - Abnormal; Notable for the following components:   Acetaminophen (Tylenol), Serum <10 (*)    All other components within normal limits  ETHANOL  SALICYLATE LEVEL  RAPID URINE DRUG SCREEN, HOSP PERFORMED    EKG None  Radiology Dg Chest Portable 1 View  Result Date: 01/22/2019 CLINICAL DATA:  Shortness of breath and chest pain for 1 year EXAM: PORTABLE CHEST 1 VIEW COMPARISON:  01/19/2019 FINDINGS: Cardiac shadow is enlarged but stable. Aortic calcifications are again seen. Mild vascular congestion is noted but improved when compared with the prior exam. Small left pleural effusion is noted as well as left midlung atelectatic changes. IMPRESSION: Small left pleural effusion and stable left midlung atelectasis. Vascular congestion but improved when compared with the prior study. Electronically Signed   By: Alcide CleverMark  Lukens M.D.   On: 01/22/2019 17:00    Procedures Procedures (including critical care time)  Medications Ordered in ED Medications  furosemide (LASIX) tablet 40 mg (40 mg Oral Given 01/23/19 0612)  lisinopril (ZESTRIL) tablet 10 mg (10 mg Oral Given 01/23/19 [id number])     Initial Impression / Assessment and Plan / ED Course  I have reviewed the triage vital signs and  the nursing notes.  Pertinent labs & imaging results that were available during my care of the patient were reviewed by me and considered in my medical decision making (see chart for details).        Patient presenting to the ED with c/o  SI. Evaluated in the ED 12 hours ago for c/o generalized pain and swelling. Labs c/w baseline. CXR yesterday was improved compared to prior. VSS. Given his Lisinopril and Lasix on arrival; will continue daily medications. Patient medically cleared for TTS evaluation.  Disposition to be determined by oncoming ED provider.   Final Clinical Impressions(s) / ED Diagnoses   Final diagnoses:  Suicidal ideation    ED Discharge Orders    None       Antony MaduraHumes, Brean Carberry, PA-C 01/23/19 [id number]    Nira Connardama, Pedro Eduardo, MD 01/24/19 2011

## 2019-01-23 NOTE — BH Assessment (Addendum)
Assessment Note  [name] is an 58 y.o. male that presents this date with S/I. Patient is observed to be drowsy at the time of assessment and renders limited history. Patient presents with a angry affect and speaks in one word answers "yes" and "no." Patient has to be prompted several times before he responds. Patient yells "Yes" when asked in reference to thoughts of self harm although does not voice any intent or plan. Per note review patient stated on admission that he had a plan to self harm by "playing [place]" although when questioned at the time of assessment patient denies that statement or having access to a firearm. Patient denies any H/I or AVH. It is unclear if patient is currently impaired although BAL was noted to be less than 5 with UDS active. Patient per chart review does have a history of alcohol use and cocaine. Patient does seem to process the content of this writer's questions although limited history can be obtained due to patient's current condition. Patient is observed to be very drowsy and terminates assessment yelling "get out" several times. Information to complete assessment was obtained from chart review and admission notes. Per notes this date, patient has a history of schizophrenia, homelessness presents to the emergency department for suicidal ideations. He states that he is suicidal and having thoughts of killing himself by [place]. Was evaluated less than 24 hours ago in the emergency department for generalized pain and edema. His work-up was found to be at baseline compared to prior ED evaluations and admissions. Left the hospital AMA 3 days ago. Patient has a longstanding history of medication noncompliance. Patient has been receiving OP [facility] although it is unclear when patient last met with that provider. Patient is well known to area providers and was last seen on 10/08/18 for [place] issues when he presented with similar issues. Case  was staffed with Akintayo MD who recommended patient be observed and monitored.    Diagnosis: F29.0 Schizophrenia, Alcohol abuse, Cocaine use    Past Medical History:  Past Medical History:  Diagnosis Date  . Chronic foot pain   . Cocaine abuse ([place])   . Diabetes mellitus without complication ([place])   . Hepatitis C    unsure   . Homelessness   . Hypertension   . Neuropathy   . Polysubstance abuse ([place])   . Schizophrenia ([place])   . Sleep apnea   . Systolic and diastolic CHF, chronic ([place])     Past Surgical History:  Procedure Laterality Date  . MULTIPLE TOOTH EXTRACTIONS      Family History:  Family History  Problem Relation Age of Onset  . Hypertension Other   . Diabetes Other     Social History:  reports that he has been smoking cigarettes. He has a 20.00 pack-year smoking history. He uses smokeless tobacco. He reports current alcohol use. He reports current drug use. Frequency: 7.00 times per week. Drugs: "Crack" cocaine, Cocaine, and Marijuana.  Additional Social History:  Alcohol / Drug Use Pain Medications: See MAR Prescriptions: See MAR Over the Counter: See MAR History of alcohol / drug use?: Yes Longest period of sobriety (when/how long): UTA Negative Consequences of Use: (UTA) Withdrawal Symptoms: (UTA) Substance #1 Name of Substance 1: Alcohol per hx 1 - Age of First Use: UTA 1 - Amount (size/oz): UTA 1 - Frequency: UTA 1 - Duration: UTA 1 - Last Use / Amount: UTA BAL pending  CIWA: CIWA-Ar BP: (!) 159/96 Pulse  Rate: (!) 102 COWS:    Allergies:  Allergies  Allergen Reactions  . Haldol [Haloperidol] Other (See Comments)    Muscle spasms, loss of voluntary movement. However, pt has taken Thorazine on multiple occasions with no adverse effects.     Home Medications: (Not in a hospital admission)   OB/GYN Status:  No LMP for male patient.  General Assessment Data Location of Assessment: [facility] TTS Assessment: In system Is this a Tele or  Face-to-Face Assessment?: Tele Assessment Is this an Initial Assessment or a Re-assessment for this encounter?: Initial Assessment Patient Accompanied by:: N/A Language Other than English: No Living Arrangements: Other (Comment) What gender do you identify as?: Male Marital status: Single Maiden name: (NA) Pregnancy Status: No Living Arrangements: Alone Can pt return to current living arrangement?: Yes Admission Status: Voluntary Is patient capable of signing voluntary admission?: Yes Referral Source: Self/Family/Friend Insurance type: Medicaid     Crisis Care Plan Living Arrangements: Alone Legal Guardian: (NA) Name of Psychiatrist: [facility] Name of Therapist: None  Education Status Is patient currently in school?: No Is the patient employed, unemployed or receiving disability?: Unemployed  Risk to self with the past 6 months Suicidal Ideation: Yes-Currently Present Has patient been a risk to self within the past 6 months prior to admission? : Yes Suicidal Intent: No Has patient had any suicidal intent within the past 6 months prior to admission? : Yes Is patient at risk for suicide?: Yes Suicidal Plan?: No Has patient had any suicidal plan within the past 6 months prior to admission? : Yes Access to Means: No What has been your use of drugs/alcohol within the last 12 months?: Current use Previous Attempts/Gestures: Yes How many times?: 1 Other Self Harm Risks: (Medication non-compliance ) Triggers for Past Attempts: Unknown Intentional Self Injurious Behavior: None Family Suicide History: No Recent stressful life event(s): (UTA) Persecutory voices/beliefs?: No Depression: (UTA) Depression Symptoms: (UTA) Substance abuse history and/or treatment for substance abuse?: No Suicide prevention information given to non-admitted patients: Not applicable  Risk to Others within the past 6 months Homicidal Ideation: No Does patient have any lifetime risk of violence toward  others beyond the six months prior to admission? : Yes (comment)(Violence towards staff) Thoughts of Harm to Others: No Current Homicidal Intent: No Current Homicidal Plan: No Access to Homicidal Means: No Identified Victim: (NA) History of harm to others?: Yes Assessment of Violence: In past 6-12 months Violent Behavior Description: Assault on psych  Does patient have access to weapons?: No Criminal Charges Pending?: No Does patient have a court date: No Is patient on probation?: No  Psychosis Hallucinations: (UTA) Delusions: (UTA)  Mental Status Report Appearance/Hygiene: Unremarkable Eye Contact: Unable to Assess Motor Activity: Agitation Speech: Soft, Slow Level of Consciousness: Drowsy, Irritable Mood: Threatening Affect: Angry Anxiety Level: Moderate Thought Processes: Flight of Ideas Judgement: Impaired Orientation: Unable to assess Obsessive Compulsive Thoughts/Behaviors: Unable to Assess  Cognitive Functioning Concentration: Unable to Assess Memory: Unable to Assess Is patient IDD: No Insight: Unable to Assess Impulse Control: Unable to Assess Appetite: (UTA) Have you had any weight changes? : (UTA) Sleep: (UTA) Total Hours of Sleep: (UTA) Vegetative Symptoms: (UTA)  ADLScreening [facility] Assessment Services) Patient's cognitive ability adequate to safely complete daily activities?: Yes Patient able to express need for assistance with ADLs?: Yes Independently performs ADLs?: Yes (appropriate for developmental age)  Prior Inpatient Therapy Prior Inpatient Therapy: Yes Prior Therapy Dates: 2020 Prior Therapy [facility]ilty[facility]Provider(s): [facility] Reason for Treatment: MH issues  Prior Outpatient  Therapy Prior Outpatient Therapy: Yes Prior Therapy Dates: Ongoing Prior Therapy Facilty/Provider(s): [name] Reason for Treatment: Med mang Does patient have an ACCT team?: No Does patient have Intensive In-House Services?  : No Does patient have [name] services? :  Yes Does patient have P4CC services?: No  ADL Screening (condition at time of admission) Patient's cognitive ability adequate to safely complete daily activities?: Yes Is the patient deaf or have difficulty hearing?: No Does the patient have difficulty seeing, even when wearing glasses/contacts?: No Does the patient have difficulty concentrating, remembering, or making decisions?: No Patient able to express need for assistance with ADLs?: Yes Does the patient have difficulty dressing or bathing?: No Independently performs ADLs?: Yes (appropriate for developmental age) Weakness of Legs: None Weakness of Arms/Hands: None  Home Assistive Devices/Equipment Home Assistive Devices/Equipment: None  Therapy Consults (therapy consults require a physician order) PT Evaluation Needed: No OT Evalulation Needed: No SLP Evaluation Needed: No Abuse/Neglect Assessment (Assessment to be complete while patient is alone) Physical Abuse: Denies Verbal Abuse: Denies Sexual Abuse: Denies Exploitation of patient/patient's resources: Denies Self-Neglect: Denies Values / Beliefs Cultural Requests During Hospitalization: None Spiritual Requests During Hospitalization: None Consults Spiritual Care Consult Needed: No Social Work Consult Needed: No Regulatory affairs officer (For Healthcare) Does Patient Have a Medical Advance Directive?: No Would patient like information on creating a medical advance directive?: No - Patient declined          Disposition: Case was staffed with Akintayo MD who recommended patient be observed and monitored.  Disposition Initial Assessment Completed for this Encounter: Yes Disposition of Patient: (Observe and monitor) Patient refused recommended treatment: No Mode of transportation if patient is discharged/movement?: (UTA)  On Site Evaluation by:   Reviewed with Physician:    Mamie Nick 01/23/2019 8:38 AM

## 2019-01-23 NOTE — BH Assessment (Signed)
[facility] Assessment Progress Note Case was staffed with Akintayo MD who recommended patient be observed and monitored.     

## 2019-01-23 NOTE — ED Notes (Signed)
So far this shift pt. Has been resting comfortably; and he continues to do so. He is easy to arouse for meds/assessment and falls right back to sleep. I plan to rouse him for lunch and will perform a complete assessment at that time. His skin is normal, warm and dry and he is breathing normally.

## 2019-01-23 NOTE — ED Triage Notes (Signed)
Patient is complaining of generalized pain all over. Patient states he wants to kill himself by [employer]. Patient brought in by [facility].

## 2019-01-24 DIAGNOSIS — R45851 Suicidal ideations: Secondary | ICD-10-CM | POA: Diagnosis not present

## 2019-01-24 DIAGNOSIS — F1994 Other psychoactive substance use, unspecified with psychoactive substance-induced mood disorder: Secondary | ICD-10-CM

## 2019-01-24 LAB — CBG MONITORING, ED: Glucose-Capillary: 164 mg/dL — ABNORMAL HIGH (ref 70–99)

## 2019-01-24 MED ORDER — LORAZEPAM 1 MG PO TABS
1.0000 mg | ORAL_TABLET | Freq: Once | ORAL | Status: AC
  Administered 2019-01-24: 1 mg via ORAL
  Filled 2019-01-24: qty 1

## 2019-01-24 NOTE — ED Notes (Signed)
His sitter is with him at all times.

## 2019-01-24 NOTE — Progress Notes (Signed)
Should have been a yes 

## 2019-01-24 NOTE — ED Provider Notes (Addendum)
I was notified by the patient's nurse RN Tamala Julian that the patient indicated he was ready to go home.  Patient currently denies suicidal ideation.  He states he is ready for discharge.  Patient is medically at his baseline.  Patient is well-known to the ED and frequently visits the ED for various complaints including issues with his heart failure, medical noncompliance, cocaine and alcohol abuse as well as depression and suicidal ideation.  Patient's had 103 ED visits in the last 6 months.  I do not feel that the patient is a danger to intentionally hurt himself.  He will most likely return to the ED in a few days considering his history but I do not see any indication to hold him in the ED longer.   Dorie Rank, MD 01/24/19 1049  Patient indicated that he wanted to leave but when staff attempted to get him ready they noted his oxygen saturation was decreased.  Improved when he sat up and took a deep breath.  I asked the patient to try to walk around the ED to make sure his oxygen level remained stable.  Patient however still remains very somnolent.  He will not get up and walk.  I will check a VBG.  We will continue to monitor him in the ED and cancel discharge for now.   Dorie Rank, MD 01/24/19 1118  ABG reviewed.  No acidosis noted.  Patient does have an elevated venous PCO2.  This is similar compared to previous values.  I suspect patient does have chronic component of obesity hypoventilation syndrome   Dorie Rank, MD 01/24/19 1327

## 2019-01-24 NOTE — Progress Notes (Addendum)
[facility] MD Progress Note  01/24/2019 12:03 PM [name]  MRN:  [id number]  Evaluation: Patient was seen and evaluated by attending psychiatrist and nurse practitioner.  Patient is well-known to this service.  Was reported that this is not patient's typical presentation  Stated that patient is usually awake and  alert with active and engaged participation throughout assessment. Per attending patient is very  " talkative."  Does not appear to be at patient's baseline.  Patient was unable to answer questions during assessment.  Patient observed falling back to sleep during assessment.  Consider additional medical screening/work-up.  Will observe overnight continue to monitor for safety.  Support, encouragement and reassurance was provided  History: Per assessment note: [name] is an 58 y.o. male that presents this date with S/I. Patient is observed to be drowsy at the time of assessment and renders limited history. Patient presents with a angry affect and speaks in one word answers "yes" and "no." Patient has to be prompted several times before he responds. Patient yells "Yes" when asked in reference to thoughts of self harm although does not voice any intent or plan. Per note review patient stated on admission that he had a plan to self harm by "playing [place]" although when questioned at the time of assessment patient denies that statement or having access to a firearm. Patient denies any H/I or AVH. It is unclear if patient is currently impaired although BAL was noted to be less than 5 with UDS active. Patient per chart review does have a history of alcohol use and cocaine. Patient does seem to process the content of this writer's questions although limited history can be obtained due to patient's current condition. Patient is observed to be very drowsy and terminates assessment yelling "get out" several times. Information to complete assessment was obtained from chart review and  admission notes. Per notes this date, patient has a history of schizophrenia, homelessness presents to the emergency department for suicidal ideations. He states that he is suicidal and having thoughts of killing himself by [place]. Was evaluated less than 24 hours ago in the emergency department for generalized pain and edema. His work-up was found to be at baseline compared to prior ED evaluations and admissions. Left the hospital AMA 3 days ago. Patient has a longstanding history of medication noncompliance. Patient has been receiving OP services from [facility] although it is unclear when patient last met with that provider. Patient is well known to area providers and was last seen on 10/08/18 for [place] issues when he presented with similar issues. Case was staffed with Savayah Waltrip MD who recommended patient be observed and monitored.     Principal Problem: Substance induced mood disorder (HCC) Diagnosis: Principal Problem:   Substance induced mood disorder ([place])  Total Time spent with patient: 15 minutes  Past Psychiatric History:  Past Medical History:  Past Medical History:  Diagnosis Date  . Chronic foot pain   . Cocaine abuse ([place])   . Diabetes mellitus without complication ([place])   . Hepatitis C    unsure   . Homelessness   . Hypertension   . Neuropathy   . Polysubstance abuse ([place])   . Schizophrenia ([place])   . Sleep apnea   . Systolic and diastolic CHF, chronic ([place])     Past Surgical History:  Procedure Laterality Date  . MULTIPLE TOOTH EXTRACTIONS     Family History:  Family History  Problem Relation Age of Onset  . Hypertension  Other   . Diabetes Other    Family Psychiatric  History:  Social History:  Social History   Substance and Sexual Activity  Alcohol Use Yes   Comment: Daily Drinker      Social History   Substance and Sexual Activity  Drug Use Yes  . Frequency: 7.0 times per week  . Types: "Crack" cocaine, Cocaine, Marijuana   Comment: Crack     Social History   Socioeconomic History  . Marital status: Single    Spouse name: Not on file  . Number of children: Not on file  . Years of education: Not on file  . Highest education level: Not on file  Occupational History  . Not on file  Social Needs  . Financial resource strain: Not on file  . Food insecurity    Worry: Not on file    Inability: Not on file  . Transportation needs    Medical: Not on file    Non-medical: Not on file  Tobacco Use  . Smoking status: Current Every Day Smoker    Packs/day: 1.00    Years: 20.00    Pack years: 20.00    Types: Cigarettes  . Smokeless tobacco: Current User  Substance and Sexual Activity  . Alcohol use: Yes    Comment: Daily Drinker   . Drug use: Yes    Frequency: 7.0 times per week    Types: "Crack" cocaine, Cocaine, Marijuana    Comment: Crack  . Sexual activity: Not Currently  Lifestyle  . Physical activity    Days per week: Not on file    Minutes per session: Not on file  . Stress: Not on file  Relationships  . Social Herbalist on phone: Not on file    Gets together: Not on file    Attends religious service: Not on file    Active member of club or organization: Not on file    Attends meetings of clubs or organizations: Not on file    Relationship status: Not on file  Other Topics Concern  . Not on file  Social History Narrative   ** Merged History Encounter **       Additional Social History:    Pain Medications: See MAR Prescriptions: See MAR Over the Counter: See MAR History of alcohol / drug use?: Yes Longest period of sobriety (when/how long): UTA Negative Consequences of Use: (UTA) Withdrawal Symptoms: (UTA) Name of Substance 1: Alcohol per hx 1 - Age of First Use: UTA 1 - Amount (size/oz): UTA 1 - Frequency: UTA 1 - Duration: UTA 1 - Last Use / Amount: UTA BAL pending                  Sleep: Fair  Appetite:  Fair  Current Medications: Current Facility-Administered  Medications  Medication Dose Route Frequency Provider Last Rate Last Dose  . acetaminophen (TYLENOL) tablet 650 mg  650 mg Oral Q6H PRN Lajean Saver, MD   650 mg at 01/24/19 1021  . ARIPiprazole (ABILIFY) tablet 10 mg  10 mg Oral Daily Antonietta Breach, PA-C   10 mg at 01/24/19 1015  . carbamazepine (TEGRETOL) tablet 100 mg  100 mg Oral BID Antonietta Breach, PA-C   100 mg at 01/24/19 1015  . furosemide (LASIX) tablet 40 mg  40 mg Oral BID Antonietta Breach, PA-C   40 mg at 01/24/19 0818  . glipiZIDE (GLUCOTROL) tablet 2.5 mg  2.5 mg Oral BID AC Antonietta Breach, PA-C  2.5 mg at 01/24/19 0818  . Ipratropium-Albuterol (COMBIVENT) respimat 1 puff  1 puff Inhalation Q6H Humes, Claiborne Billings, PA-C   1 puff at 01/24/19 0818  . lisinopril (ZESTRIL) tablet 10 mg  10 mg Oral Daily Antonietta Breach, PA-C   10 mg at 01/24/19 1015  . potassium chloride SA (K-DUR) CR tablet 20 mEq  20 mEq Oral BID Antonietta Breach, PA-C   20 mEq at 01/24/19 1015   Current Outpatient Medications  Medication Sig Dispense Refill  . acetaminophen (TYLENOL) 500 MG tablet Take 1 tablet (500 mg total) by mouth every 6 (six) hours as needed. (Patient not taking: Reported on 01/19/2019) 30 tablet 0  . ARIPiprazole (ABILIFY) 10 MG tablet Take 1 tablet (10 mg total) by mouth daily. (Patient not taking: Reported on 01/13/2019) 30 tablet 2  . carbamazepine (TEGRETOL) 200 MG tablet Take 0.5 tablets (100 mg total) by mouth 2 (two) times daily. (Patient not taking: Reported on 01/15/2019) 60 tablet 2  . cefdinir (OMNICEF) 300 MG capsule Take 1 capsule (300 mg total) by mouth 2 (two) times daily for 7 days. (Patient not taking: Reported on 01/19/2019) 14 capsule 0  . furosemide (LASIX) 40 MG tablet Take 1 tablet (40 mg total) by mouth 2 (two) times daily. (Patient not taking: Reported on 01/19/2019) 60 tablet 0  . glipiZIDE (GLUCOTROL) 5 MG tablet Take 0.5 tablets (2.5 mg total) by mouth 2 (two) times daily before a meal. (Patient not taking: Reported on 01/19/2019) 60 tablet 4   . Ipratropium-Albuterol (COMBIVENT) 20-100 MCG/ACT AERS respimat Inhale 1 puff into the lungs every 6 (six) hours. (Patient not taking: Reported on 01/15/2019) 1 Inhaler 0  . lisinopril (ZESTRIL) 10 MG tablet Take 1 tablet (10 mg total) by mouth daily. (Patient not taking: Reported on 01/15/2019) 30 tablet 1  . mupirocin ointment (BACTROBAN) 2 % Apply topically 3 (three) times daily for 7 days. Apply to affected area 3 times daily (Patient not taking: Reported on 01/19/2019) 22 g 0  . potassium chloride SA (K-DUR) 20 MEQ tablet Take 1 tablet (20 mEq total) by mouth 2 (two) times daily. (Patient not taking: Reported on 01/15/2019) 10 tablet 0    Lab Results:  Results for orders placed or performed during the hospital encounter of 01/23/19 (from the past 48 hour(s))  Rapid urine drug screen (hospital performed)     Status: Abnormal   Collection Time: 01/23/19  5:14 AM  Result Value Ref Range   Opiates NONE DETECTED NONE DETECTED   Cocaine POSITIVE (A) NONE DETECTED   Benzodiazepines NONE DETECTED NONE DETECTED   Amphetamines NONE DETECTED NONE DETECTED   Tetrahydrocannabinol NONE DETECTED NONE DETECTED   Barbiturates NONE DETECTED NONE DETECTED    Comment: (NOTE) DRUG SCREEN FOR MEDICAL PURPOSES ONLY.  IF CONFIRMATION IS NEEDED FOR ANY PURPOSE, NOTIFY LAB WITHIN 5 DAYS. LOWEST DETECTABLE LIMITS FOR URINE DRUG SCREEN Drug Class                     Cutoff (ng/mL) Amphetamine and metabolites    1000 Barbiturate and metabolites    200 Benzodiazepine                 557 Tricyclics and metabolites     300 Opiates and metabolites        300 Cocaine and metabolites        300 THC  50 Performed at [facility], [place] [address], [place], [place] [address]   Comprehensive metabolic panel     Status: Abnormal   Collection Time: 01/23/19  5:25 AM  Result Value Ref Range   Sodium 140 135 - 145 mmol/L   Potassium 4.1 3.5 - 5.1 mmol/L   Chloride 101 98  - 111 mmol/L   CO2 27 22 - 32 mmol/L   Glucose, Bld 242 (H) 70 - 99 mg/dL   BUN 22 (H) 6 - 20 mg/dL   Creatinine, Ser 0.93 0.61 - 1.24 mg/dL   Calcium 8.7 (L) 8.9 - 10.3 mg/dL   Total Protein 7.6 6.5 - 8.1 g/dL   Albumin 3.0 (L) 3.5 - 5.0 g/dL   AST 21 15 - 41 U/L   ALT 11 0 - 44 U/L   Alkaline Phosphatase 167 (H) 38 - 126 U/L   Total Bilirubin 1.0 0.3 - 1.2 mg/dL   GFR calc non Af Amer >60 >60 mL/min   GFR calc Af Amer >60 >60 mL/min   Anion gap 12 5 - 15    Comment: Performed at [facility], [place] [address], [place], [place] [address]  Ethanol     Status: None   Collection Time: 01/23/19  5:25 AM  Result Value Ref Range   Alcohol, Ethyl (B) <10 <10 mg/dL    Comment: (NOTE) Lowest detectable limit for serum alcohol is 10 mg/dL. For medical purposes only. Performed at [facility], [place] [address], [place], [place] [address]   cbc     Status: Abnormal   Collection Time: 01/23/19  5:25 AM  Result Value Ref Range   WBC 8.1 4.0 - 10.5 K/uL   RBC 4.02 (L) 4.22 - 5.81 MIL/uL   Hemoglobin 8.9 (L) 13.0 - 17.0 g/dL   HCT 31.7 (L) 39.0 - 52.0 %   MCV 78.9 (L) 80.0 - 100.0 fL   MCH 22.1 (L) 26.0 - 34.0 pg   MCHC 28.1 (L) 30.0 - 36.0 g/dL   RDW 21.2 (H) 11.5 - 15.5 %   Platelets 367 150 - 400 K/uL   nRBC 0.2 0.0 - 0.2 %    Comment: Performed at [facility], [place] [address], [place], [place] [address]  Salicylate level     Status: None   Collection Time: 01/23/19  5:25 AM  Result Value Ref Range   Salicylate Lvl <8.5 2.8 - 30.0 mg/dL    Comment: Performed at [facility], [place] [address], [place], [place] [address]  Acetaminophen level     Status: Abnormal   Collection Time: 01/23/19  5:25 AM  Result Value Ref Range   Acetaminophen (Tylenol), Serum <10 (L) 10 - 30 ug/mL    Comment: (NOTE) Therapeutic concentrations vary significantly. A range of 10-30 ug/mL  may be an effective concentration for many  patients. However, some  are best treated at concentrations outside of this range. Acetaminophen concentrations >150 ug/mL at 4 hours after ingestion  and >50 ug/mL at 12 hours after ingestion are often associated with  toxic reactions. Performed at [facility], [place] [address], [place], [place] [address]   SARS Coronavirus 2 (CEPHEID - Performed in [place] hospital lab), Hosp Order     Status: None   Collection Time: 01/23/19  7:57 PM   Specimen: Nasopharyngeal Swab  Result Value Ref Range   SARS Coronavirus 2 NEGATIVE NEGATIVE    Comment: (NOTE) If result is  NEGATIVE SARS-CoV-2 target nucleic acids are NOT DETECTED. The SARS-CoV-2 RNA is generally detectable in upper and lower  respiratory specimens during the acute phase of infection. The lowest  concentration of SARS-CoV-2 viral copies this assay can detect is 250  copies / mL. A negative result does not preclude SARS-CoV-2 infection  and should not be used as the sole basis for treatment or other  patient management decisions.  A negative result may occur with  improper specimen collection / handling, submission of specimen other  than nasopharyngeal swab, presence of viral mutation(s) within the  areas targeted by this assay, and inadequate number of viral copies  (<250 copies / mL). A negative result must be combined with clinical  observations, patient history, and epidemiological information. If result is POSITIVE SARS-CoV-2 target nucleic acids are DETECTED. The SARS-CoV-2 RNA is generally detectable in upper and lower  respiratory specimens dur ing the acute phase of infection.  Positive  results are indicative of active infection with SARS-CoV-2.  Clinical  correlation with patient history and other diagnostic information is  necessary to determine patient infection status.  Positive results do  not rule out bacterial infection or co-infection with other viruses. If result is PRESUMPTIVE  POSTIVE SARS-CoV-2 nucleic acids MAY BE PRESENT.   A presumptive positive result was obtained on the submitted specimen  and confirmed on repeat testing.  While 2019 novel coronavirus  (SARS-CoV-2) nucleic acids may be present in the submitted sample  additional confirmatory testing may be necessary for epidemiological  and / or clinical management purposes  to differentiate between  SARS-CoV-2 and other Sarbecovirus currently known to infect humans.  If clinically indicated additional testing with an alternate test  methodology [phone]) is advised. The SARS-CoV-2 RNA is generally  detectable in upper and lower respiratory sp ecimens during the acute  phase of infection. The expected result is Negative. Fact Sheet for Patients:  StrictlyIdeas.no Fact Sheet for Healthcare Providers: BankingDealers.co.za This test is not yet approved or cleared by the [place] FDA and has been authorized for detection and/or diagnosis of SARS-CoV-2 by FDA under an Emergency Use Authorization (EUA).  This EUA will remain in effect (meaning this test can be used) for the duration of the COVID-19 declaration under Section 564(b)(1) of the Act, 21 U.S.C. section 360bbb-3(b)(1), unless the authorization is terminated or revoked sooner. Performed at [facility], [place] [address] [place], [place] [address]   CBG monitoring, ED     Status: Abnormal   Collection Time: 01/24/19 11:43 AM  Result Value Ref Range   Glucose-Capillary 164 (H) 70 - 99 mg/dL    Blood Alcohol level:  Lab Results  Component Value Date   ETH <10 01/23/2019   ETH <10 37/34/2876    Metabolic Disorder Labs: Lab Results  Component Value Date   HGBA1C 9.7 (H) 12/04/2018   MPG 232 12/04/2018   MPG 223.08 12/02/2018   Lab Results  Component Value Date   PROLACTIN 19.7 (H) 09/30/2016   Lab Results  Component Value Date   CHOL 124 09/03/2018   TRIG 47  09/03/2018   HDL 32 (L) 09/03/2018   CHOLHDL 3.9 09/03/2018   VLDL 9 09/03/2018   LDLCALC 83 09/03/2018   LDLCALC 96 07/15/2018    Physical Findings: AIMS:  , ,  ,  ,    CIWA:    COWS:     Musculoskeletal: Strength & Muscle Tone: N/A Gait & Station: n/a Patient leans: N/A  Psychiatric Specialty Exam:  Physical Exam  Constitutional: He appears well-developed.  Psychiatric: He has a normal mood and affect. His behavior is normal.    Review of Systems  Psychiatric/Behavioral: Negative for depression.  All other systems reviewed and are negative.   Blood pressure (!) 169/118, pulse (!) 102, temperature 98.4 F (36.9 C), temperature source Oral, resp. rate 20, height '5\' 9"'  (1.753 m), weight 104.3 kg, SpO2 (!) 75 %.Body mass index is 33.96 kg/m.  General Appearance: Guarded  Eye Contact:  Minimal  Speech:  NA  Volume:  Normal  Mood:  Dysphoric  Affect:  NA  Thought Process:  NA  Orientation:  NA  Thought Content:  WDL  Suicidal Thoughts:  No  Homicidal Thoughts:  No  Memory:  NA  Judgement:  NA  Insight:  NA  Psychomotor Activity:  NA  Concentration:  Concentration: NA  Recall:  NA  Fund of Knowledge:  NA  Language:  NA  Akathisia:  NA  Handed:    AIMS (if indicated):     Assets:  Communication Skills Desire for Improvement Resilience Social Support  ADL's:  Intact  Cognition:  WNL  Sleep:        Treatment Plan Summary: Daily contact with patient to assess and evaluate symptoms and progress in treatment and Medication management   Continue overnight observation Consider discontinuing sedating medications i.e. Ativan  Derrill [place], NP 01/24/2019, 12:03 PM  Patient seen face-to-face for psychiatric evaluation, chart reviewed and case discussed with the physician extender and developed treatment plan. Reviewed the information documented and agree with the treatment plan. Corena Pilgrim, MD

## 2019-01-24 NOTE — Discharge Instructions (Addendum)
Follow-up with the doctors at [facility].  Follow-up with your primary care doctor.  Please continue to take your medications.  Avoid any alcohol or drug use.

## 2019-01-24 NOTE — ED Notes (Signed)
Patient called RN into room and said that he lost his tooth. Tooth found laying on bedside table. Patient now requesting a toothbrush, mouthwash and toothpaste. Patient also requesting to go outside and "watch the dawn." MD made aware.

## 2019-01-24 NOTE — Progress Notes (Signed)
PaO2 on venous blood gas was below detectable range. Critical lab value reported to [employer] RN.

## 2019-01-24 NOTE — ED Notes (Signed)
I have just obtained an inpatient hospital bed for him. He gets up and ambulates with minimal steadying assistance. He is in no distress.

## 2019-01-24 NOTE — ED Notes (Signed)
He remains very drowsy, sleeping frequently.

## 2019-01-24 NOTE — ED Notes (Signed)
While he rouses to ambulate to b.r. and also to eat his meals; most of the time he remains quite somnolent. About 40 min. Ago he abruptly announced that "I'm ready to leave". Dr. Tomi Bamberger was notified and examined him. When he is deeply asleep, his SPO2 is high 70's to mid-80's. When he is awake his SPO2 is 92-95 on rm. Air. Also, he has just been seen per TTS by Dr. Loni Muse. (psych.), who recommends he stay another day, and that this somnolence is unusual for him. Sitter remains with pt.

## 2019-01-24 NOTE — Progress Notes (Signed)
Should have been a yes

## 2019-01-25 ENCOUNTER — Emergency Department (HOSPITAL_COMMUNITY): Payer: Medicaid Other

## 2019-01-25 ENCOUNTER — Emergency Department (HOSPITAL_COMMUNITY)
Admission: EM | Admit: 2019-01-25 | Discharge: 2019-01-25 | Discharge status: Against Medical Advice | Payer: Medicaid Other | Attending: Emergency Medicine | Admitting: Emergency Medicine

## 2019-01-25 ENCOUNTER — Other Ambulatory Visit: Payer: Self-pay

## 2019-01-25 ENCOUNTER — Encounter (HOSPITAL_COMMUNITY): Payer: Self-pay | Admitting: Emergency Medicine

## 2019-01-25 DIAGNOSIS — I5043 Acute on chronic combined systolic (congestive) and diastolic (congestive) heart failure: Secondary | ICD-10-CM | POA: Insufficient documentation

## 2019-01-25 DIAGNOSIS — Z20828 Contact with and (suspected) exposure to other viral communicable diseases: Secondary | ICD-10-CM | POA: Insufficient documentation

## 2019-01-25 DIAGNOSIS — I2510 Atherosclerotic heart disease of native coronary artery without angina pectoris: Secondary | ICD-10-CM | POA: Insufficient documentation

## 2019-01-25 DIAGNOSIS — R0602 Shortness of breath: Secondary | ICD-10-CM | POA: Diagnosis present

## 2019-01-25 DIAGNOSIS — F17210 Nicotine dependence, cigarettes, uncomplicated: Secondary | ICD-10-CM | POA: Diagnosis not present

## 2019-01-25 DIAGNOSIS — I110 Hypertensive heart disease with heart failure: Secondary | ICD-10-CM | POA: Diagnosis not present

## 2019-01-25 DIAGNOSIS — I509 Heart failure, unspecified: Secondary | ICD-10-CM

## 2019-01-25 DIAGNOSIS — Z590 Homelessness: Secondary | ICD-10-CM | POA: Diagnosis not present

## 2019-01-25 DIAGNOSIS — E119 Type 2 diabetes mellitus without complications: Secondary | ICD-10-CM | POA: Diagnosis not present

## 2019-01-25 LAB — CBC WITH DIFFERENTIAL/PLATELET
Abs Immature Granulocytes: 0 10*3/uL (ref 0.00–0.07)
Basophils Absolute: 0 10*3/uL (ref 0.0–0.1)
Basophils Relative: 0 %
Eosinophils Absolute: 0 10*3/uL (ref 0.0–0.5)
Eosinophils Relative: 0 %
HCT: 33 % — ABNORMAL LOW (ref 39.0–52.0)
Hemoglobin: 9 g/dL — ABNORMAL LOW (ref 13.0–17.0)
Lymphocytes Relative: 7 %
Lymphs Abs: 0.5 10*3/uL — ABNORMAL LOW (ref 0.7–4.0)
MCH: 22 pg — ABNORMAL LOW (ref 26.0–34.0)
MCHC: 27.3 g/dL — ABNORMAL LOW (ref 30.0–36.0)
MCV: 80.5 fL (ref 80.0–100.0)
Monocytes Absolute: 0.2 10*3/uL (ref 0.1–1.0)
Monocytes Relative: 3 %
Neutro Abs: 6.9 10*3/uL (ref 1.7–7.7)
Neutrophils Relative %: 90 %
Platelets: 355 10*3/uL (ref 150–400)
RBC: 4.1 MIL/uL — ABNORMAL LOW (ref 4.22–5.81)
RDW: 21.5 % — ABNORMAL HIGH (ref 11.5–15.5)
WBC: 7.7 10*3/uL (ref 4.0–10.5)
nRBC: 0 /100 WBC
nRBC: 0.8 % — ABNORMAL HIGH (ref 0.0–0.2)

## 2019-01-25 LAB — COMPREHENSIVE METABOLIC PANEL
ALT: 12 U/L (ref 0–44)
AST: 24 U/L (ref 15–41)
Albumin: 2.7 g/dL — ABNORMAL LOW (ref 3.5–5.0)
Alkaline Phosphatase: 155 U/L — ABNORMAL HIGH (ref 38–126)
Anion gap: 12 (ref 5–15)
BUN: 19 mg/dL (ref 6–20)
CO2: 22 mmol/L (ref 22–32)
Calcium: 8.7 mg/dL — ABNORMAL LOW (ref 8.9–10.3)
Chloride: 104 mmol/L (ref 98–111)
Creatinine, Ser: 1.1 mg/dL (ref 0.61–1.24)
GFR calc Af Amer: >60 mL/min (ref >60)
GFR calc non Af Amer: >60 mL/min (ref >60)
Glucose, Bld: 195 mg/dL — ABNORMAL HIGH (ref 70–99)
Potassium: 4.5 mmol/L (ref 3.5–5.1)
Sodium: 138 mmol/L (ref 135–145)
Total Bilirubin: 0.9 mg/dL (ref 0.3–1.2)
Total Protein: 6.9 g/dL (ref 6.5–8.1)

## 2019-01-25 LAB — ETHANOL: Alcohol, Ethyl (B): <10 mg/dL (ref <10)

## 2019-01-25 LAB — BRAIN NATRIURETIC PEPTIDE: B Natriuretic Peptide: 866.7 pg/mL — ABNORMAL HIGH (ref 0.0–100.0)

## 2019-01-25 LAB — SARS CORONAVIRUS 2 BY RT PCR (HOSPITAL ORDER, PERFORMED IN ~~LOC~~ HOSPITAL LAB): SARS Coronavirus 2: NEGATIVE

## 2019-01-25 MED ORDER — ACETAMINOPHEN 325 MG PO TABS
650.0000 mg | ORAL_TABLET | Freq: Once | ORAL | Status: AC
  Administered 2019-01-25: 650 mg via ORAL
  Filled 2019-01-25: qty 2

## 2019-01-25 MED ORDER — FUROSEMIDE 10 MG/ML IJ SOLN
60.0000 mg | Freq: Once | INTRAMUSCULAR | Status: AC
  Administered 2019-01-25: 60 mg via INTRAVENOUS
  Filled 2019-01-25: qty 6

## 2019-01-25 NOTE — ED Notes (Signed)
Pt has edema to scrotum extending down bilateral LE.  C/O burning to skin.  Pt in hospital scrubs on arrival that are soiled.  Soapy washcloths given for patient to clean himself up and he is able to do so.  Considerable dyspnea with slight exertion, placed on 5 L/[place]. Pt unkept and homeless.  Has not been taking any meds.

## 2019-01-25 NOTE — ED Triage Notes (Signed)
EMS stated, D/C'd [facility] last night and went to gas station hanging out and smoke some crack around 0500 and said it was bad. Pt stated, Im SOB and my legs are swollen .given O2  2L went to 96%.

## 2019-01-25 NOTE — ED Provider Notes (Signed)
[facility] EMERGENCY DEPARTMENT Provider Note   CSN: [id number] Arrival date & time: 01/25/19  1013     History   Chief Complaint No chief complaint on file.   HPI [name] is a 58 y.o. male.     Patient is a 58 year old male with multiple chronic medical problems including CHF with an EF of 30 to 35%, diabetes, schizophrenia, polysubstance abuse, frequent emergency room visits who was just seen in the ER yesterday for psychiatric complaints and homelessness who is presenting today with shortness of breath and leg swelling.  Patient always has chronic leg swelling but today it was satting 84% on room air with tachypnea.  Patient's states he is still smoking cocaine and denies drinking any alcohol.  He is denying any chest pain or abdominal pain except for complaining of tightness of her skin from the swelling.  It is unclear if patient is taking his Lasix which he is supposed to be taking 80 mg/day.  Patient was last admitted to the hospital 5 days ago for CHF exacerbation where he left AMA after 24 hours.  The history is provided by the patient.    Past Medical History:  Diagnosis Date  . Chronic foot pain   . Cocaine abuse ([place])   . Diabetes mellitus without complication ([place])   . Hepatitis C    unsure   . Homelessness   . Hypertension   . Neuropathy   . Polysubstance abuse ([place])   . Schizophrenia ([place])   . Sleep apnea   . Systolic and diastolic CHF, chronic [facility])     Patient Active Problem List   Diagnosis Date Noted  . Combined systolic and diastolic heart failure ([place]) 01/19/2019  . Chest pain 01/16/2019  . 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  . Medically noncompliant   . Acute on chronic systolic CHF (congestive heart failure) ([place]) 01/02/2019  . Acute on chronic combined systolic (congestive) and diastolic (congestive) heart failure ([place]) 01/02/2019  . Acute on chronic systolic heart failure ([place]) 12/26/2018  . Mobitz type II atrioventricular  block 12/25/2018  . Acute exacerbation of CHF (congestive heart failure) ([place]) 12/22/2018  . Acute on chronic respiratory failure with hypoxia ([place]) 12/22/2018  . CHF exacerbation ([place]) 12/19/2018  . Acute CHF (congestive heart failure) ([place]) 12/16/2018  . Diabetes mellitus without complication ([place]) 66/44/0347  . Acute on chronic systolic (congestive) heart failure ([place]) 12/11/2018  . Abdominal pain 12/11/2018  . Nausea vomiting and diarrhea 12/11/2018  . Microcytic anemia 12/11/2018  . Evaluation by psychiatric service required   . MDD (major depressive disorder), severe ([place]) 11/25/2018  . Pressure injury of skin 11/08/2018  . Elevated troponin 10/18/2018  . HLD (hyperlipidemia) 10/18/2018  . Anxiety 10/18/2018  . CHF (congestive heart failure), NYHA class II, acute on chronic, combined ([place]) 10/18/2018  . Hypertensive urgency 10/12/2018  . Mild renal insufficiency 10/12/2018  . Cellulitis 10/12/2018  . Penile cellulitis   . Adjustment disorder with mixed disturbance of emotions and conduct   . Sleep apnea 10/03/2018  . Hypokalemia 09/17/2018  . Scrotal swelling 09/17/2018  . Urinary hesitancy   . Constipation   . Acute on chronic combined systolic and diastolic CHF (congestive heart failure) ([place]) 08/25/2018  . Tobacco use 08/18/2018  . Homelessness 08/08/2018  . Smoker 08/08/2018  . Prostate enlargement 03/16/2018  . Aortic atherosclerosis ([place]) 03/16/2018  . 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 Chronic foot pain   . Schizoaffective disorder, bipolar type ([place]) 09/30/2016  .  Substance induced mood disorder (HCC) 03/13/2015  . Schizophrenia, paranoid type (HCC) 01/17/2015  . Drug hallucinosis (HCC) 10/08/2014  . Chronic paranoid schizophrenia (HCC) 09/07/2014  . 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 Urinary retention   . Cocaine use disorder, severe, dependence (HCC)   . Essential  hypertension 03/28/2013  . 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 MULTIPLE TOOTH EXTRACTIONS          Home Medications    Prior to Admission medications   Medication Sig Start Date End Date Taking? Authorizing Provider  acetaminophen (TYLENOL) 500 MG tablet Take 1 tablet (500 mg total) by mouth every 6 (six) hours as needed. Patient not taking: Reported on 01/19/2019 01/08/19   Fayrene Helperran, [place], PA-C  ARIPiprazole (ABILIFY) 10 MG tablet Take 1 tablet (10 mg total) by mouth daily. Patient not taking: Reported on 01/13/2019 12/10/18   Joycelyn DasPokhrel, Laxman, MD  carbamazepine (TEGRETOL) 200 MG tablet Take 0.5 tablets (100 mg total) by mouth 2 (two) times daily. Patient not taking: Reported on 01/15/2019 11/26/18   Mancel BaleWentz, Elliott, MD  furosemide (LASIX) 40 MG tablet Take 1 tablet (40 mg total) by mouth 2 (two) times daily. Patient not taking: Reported on 01/19/2019 01/17/19 02/16/19  Alessandra BevelsKamineni, Neelima, MD  glipiZIDE (GLUCOTROL) 5 MG tablet Take 0.5 tablets (2.5 mg total) by mouth 2 (two) times daily before a meal. Patient not taking: Reported on 01/19/2019 01/01/19   Rai, Delene Ruffiniipudeep K, MD  Ipratropium-Albuterol (COMBIVENT) 20-100 MCG/ACT AERS respimat Inhale 1 puff into the lungs every 6 (six) hours. Patient not taking: Reported on 01/15/2019 11/30/18   Palumbo, April, MD  lisinopril (ZESTRIL) 10 MG tablet Take 1 tablet (10 mg total) by mouth daily. Patient not taking: Reported on 01/15/2019 11/26/18   Mancel BaleWentz, Elliott, MD  potassium chloride SA (K-DUR) 20 MEQ tablet Take 1 tablet (20 mEq total) by mouth 2 (two) times daily. Patient not taking: Reported on 01/15/2019 11/30/18   Nicanor AlconPalumbo, April, MD    Family History Family History  Problem Relation Age of Onset  . Hypertension Other   . Diabetes Other     Social History Social History   Tobacco Use  . Smoking status: Current Every Day Smoker    Packs/day: 1.00    Years:  20.00    Pack years: 20.00    Types: Cigarettes  . Smokeless tobacco: Current User  Substance Use Topics  . Alcohol use: Yes    Comment: Daily Drinker   . Drug use: Yes    Frequency: 7.0 times per week    Types: "Crack" cocaine, Cocaine, Marijuana    Comment: Crack     Allergies   Haldol [haloperidol]   Review of Systems Review of Systems  All other systems reviewed and are negative.    Physical Exam Updated Vital Signs BP (!) 152/94   Pulse 98   Temp 98.4 F (36.9 C) (Oral)   Resp (!) 25   SpO2 99%   Physical Exam Vitals signs and nursing note reviewed.  Constitutional:      General: He is not in acute distress.    Appearance: He is well-developed. He is obese.  HENT:     Head: Normocephalic and atraumatic.     Nose: Nose normal.  Eyes:     Conjunctiva/sclera: Conjunctivae normal.     Pupils: Pupils are equal, round, and reactive to light.  Neck:     Musculoskeletal: Normal range of motion and neck supple.     Comments: JVD bilaterally Cardiovascular:     Rate and Rhythm: Regular rhythm. Tachycardia present.     Pulses: Normal pulses.     Heart sounds: No murmur.  Pulmonary:     Effort: Pulmonary effort is normal. Tachypnea present. No respiratory distress.     Breath sounds: Rales present. No wheezing.  Abdominal:     General: There is distension.     Palpations: Abdomen is soft.     Tenderness: There is no abdominal tenderness. There is no guarding or rebound.     Comments: Edema present in the abdomen  Musculoskeletal: Normal range of motion.        General: No tenderness.     Right lower leg: Edema present.     Left lower leg: Edema present.     Comments: 3-4+ pitting edema with tense edema in the lower extremities up to the groin.  Edema in the scrotum  Skin:    General: Skin is warm and dry.     Findings: No erythema or rash.  Neurological:     Mental Status: He is alert and oriented to person, place, and time.  Psychiatric:     Comments:  Calm and cooperative but history of schizophrenia, paranoia      ED Treatments / Results  Labs (all labs ordered are listed, but only abnormal results are displayed) Labs Reviewed  CBC WITH DIFFERENTIAL/PLATELET - Abnormal; Notable for the following components:      Result Value   RBC 4.10 (*)    Hemoglobin 9.0 (*)    HCT 33.0 (*)    MCH 22.0 (*)    MCHC 27.3 (*)    RDW 21.5 (*)    nRBC 0.8 (*)    Lymphs Abs 0.5 (*)    All other components within normal limits  COMPREHENSIVE METABOLIC PANEL - Abnormal; Notable for the following components:   Glucose, Bld 195 (*)    Calcium 8.7 (*)    Albumin 2.7 (*)    Alkaline Phosphatase 155 (*)    All other components within normal limits  BRAIN NATRIURETIC PEPTIDE - Abnormal; Notable for the following components:   B Natriuretic Peptide 866.7 (*)    All other components within normal limits  SARS CORONAVIRUS 2 (HOSPITAL ORDER, PERFORMED IN [facility])  ETHANOL    EKG EKG Interpretation  Date/Time:  Monday January 25 2019 10:27:21 EDT Ventricular Rate:  102 PR Interval:  142 QRS Duration: 80 QT Interval:  372 QTC Calculation: 484 R Axis:   69 Text Interpretation:  Sinus tachycardia Low voltage QRS Cannot rule out Anterior infarct , age undetermined ST & T wave abnormality, consider inferolateral ischemia No significant change since last tracing Confirmed by Gwyneth SproutPlunkett, Linh Hedberg ([id number]) on 01/25/2019 10:51:31 AM Also confirmed by Gwyneth SproutPlunkett, Otis Portal ([id number]), editor Barbette Hairassel, Kerry [phone]  on 01/25/2019 11:23:35 AM   Radiology Dg Chest Port 1 View  Result Date: 01/25/2019 CLINICAL DATA:  Shortness of breath. Chest pain. EXAM: PORTABLE CHEST 1 VIEW COMPARISON:  01/22/2019, 01/19/2019 and 01/16/2019 FINDINGS: Chronic cardiomegaly. Pulmonary vascularity is normal. No infiltrates. Slight haziness at the lung bases may represent tiny effusions. No acute bone abnormality. IMPRESSION: Possible tiny effusions. Chronic cardiomegaly.  Electronically Signed   By: Francene BoyersJames  Maxwell M.D.   On: 01/25/2019 11:48    Procedures Procedures (including critical care time)  Medications Ordered in ED Medications  acetaminophen (TYLENOL) tablet  650 mg (has no administration in time range)     Initial Impression / Assessment and Plan / ED Course  I have reviewed the triage vital signs and the nursing notes.  Pertinent labs & imaging results that were available during my care of the patient were reviewed by me and considered in my medical decision making (see chart for details).        Patient well-known to the emergency room presenting today with concern for CHF exacerbation.  Patient was on exam fluid overloaded with significant edema in lower extremities, abdomen and JVD.  Patient has bilateral rales and was satting 84% on room air.  Patient does not take his Lasix regularly and continues to smoke cocaine.  He denies any chest pain  at this time and EKG is unchanged.  Patient is currently satting 99% on 2 L.  3:56 PM  Labs are at baseline.  Patient was given an IV dose of Lasix but then became very agitated and angry and demanded that he was going to leave.  Patient decided to leave AMA.  He was satting normally prior to discharge with oxygen removed.  Patient was allowed to leave  Final Clinical Impressions(s) / ED Diagnoses   Final diagnoses:  Acute on chronic congestive heart failure, unspecified heart failure type [facility](HCC)    ED Discharge Orders    None       Gwyneth SproutPlunkett, Salvator Seppala, MD 01/25/19 1556

## 2019-01-25 NOTE — ED Notes (Signed)
Pt upset that this nurse has not brought him lunch.  Requests to go home now.  Dr. Maryan Rued consulted and will send pt home.

## 2019-01-26 ENCOUNTER — Encounter (HOSPITAL_COMMUNITY): Payer: Self-pay | Admitting: Emergency Medicine

## 2019-01-26 ENCOUNTER — Emergency Department (HOSPITAL_COMMUNITY)
Admission: EM | Admit: 2019-01-26 | Discharge: 2019-01-26 | Disposition: A | Discharge status: Home / Self Care | Payer: Medicaid Other | Attending: Emergency Medicine | Admitting: Emergency Medicine

## 2019-01-26 DIAGNOSIS — Z79899 Other long term (current) drug therapy: Secondary | ICD-10-CM | POA: Diagnosis not present

## 2019-01-26 DIAGNOSIS — I110 Hypertensive heart disease with heart failure: Secondary | ICD-10-CM | POA: Insufficient documentation

## 2019-01-26 DIAGNOSIS — I5043 Acute on chronic combined systolic (congestive) and diastolic (congestive) heart failure: Secondary | ICD-10-CM | POA: Insufficient documentation

## 2019-01-26 DIAGNOSIS — Z7984 Long term (current) use of oral hypoglycemic drugs: Secondary | ICD-10-CM | POA: Insufficient documentation

## 2019-01-26 DIAGNOSIS — M7989 Other specified soft tissue disorders: Secondary | ICD-10-CM | POA: Diagnosis present

## 2019-01-26 DIAGNOSIS — Z590 Homelessness: Secondary | ICD-10-CM | POA: Diagnosis not present

## 2019-01-26 DIAGNOSIS — E119 Type 2 diabetes mellitus without complications: Secondary | ICD-10-CM | POA: Insufficient documentation

## 2019-01-26 DIAGNOSIS — F17210 Nicotine dependence, cigarettes, uncomplicated: Secondary | ICD-10-CM | POA: Diagnosis not present

## 2019-01-26 DIAGNOSIS — R609 Edema, unspecified: Secondary | ICD-10-CM | POA: Diagnosis not present

## 2019-01-26 LAB — BASIC METABOLIC PANEL
Anion gap: 10 (ref 5–15)
BUN: 18 mg/dL (ref 6–20)
CO2: 24 mmol/L (ref 22–32)
Calcium: 8.6 mg/dL — ABNORMAL LOW (ref 8.9–10.3)
Chloride: 106 mmol/L (ref 98–111)
Creatinine, Ser: 1.04 mg/dL (ref 0.61–1.24)
GFR calc Af Amer: >60 mL/min (ref >60)
GFR calc non Af Amer: >60 mL/min (ref >60)
Glucose, Bld: 181 mg/dL — ABNORMAL HIGH (ref 70–99)
Potassium: 3.9 mmol/L (ref 3.5–5.1)
Sodium: 140 mmol/L (ref 135–145)

## 2019-01-26 LAB — CBC
HCT: 28.8 % — ABNORMAL LOW (ref 39.0–52.0)
Hemoglobin: 8.3 g/dL — ABNORMAL LOW (ref 13.0–17.0)
MCH: 21.7 pg — ABNORMAL LOW (ref 26.0–34.0)
MCHC: 28.8 g/dL — ABNORMAL LOW (ref 30.0–36.0)
MCV: 75.4 fL — ABNORMAL LOW (ref 80.0–100.0)
Platelets: 343 10*3/uL (ref 150–400)
RBC: 3.82 MIL/uL — ABNORMAL LOW (ref 4.22–5.81)
RDW: 20.9 % — ABNORMAL HIGH (ref 11.5–15.5)
WBC: 7.6 10*3/uL (ref 4.0–10.5)
nRBC: 0.5 % — ABNORMAL HIGH (ref 0.0–0.2)

## 2019-01-26 LAB — TROPONIN I (HIGH SENSITIVITY)
Troponin I (High Sensitivity): 23 ng/L — ABNORMAL HIGH (ref <18)
Troponin I (High Sensitivity): 21 ng/L — ABNORMAL HIGH (ref <18)

## 2019-01-26 IMAGING — CR DG CHEST 2V
2 series · 2 of 2 positions shown · non-contrast
Comparison: 03/28/2018

CLINICAL DATA: Shortness of breath, lower extremity swelling

EXAM:
CHEST - 2 VIEW

[x chest ap]
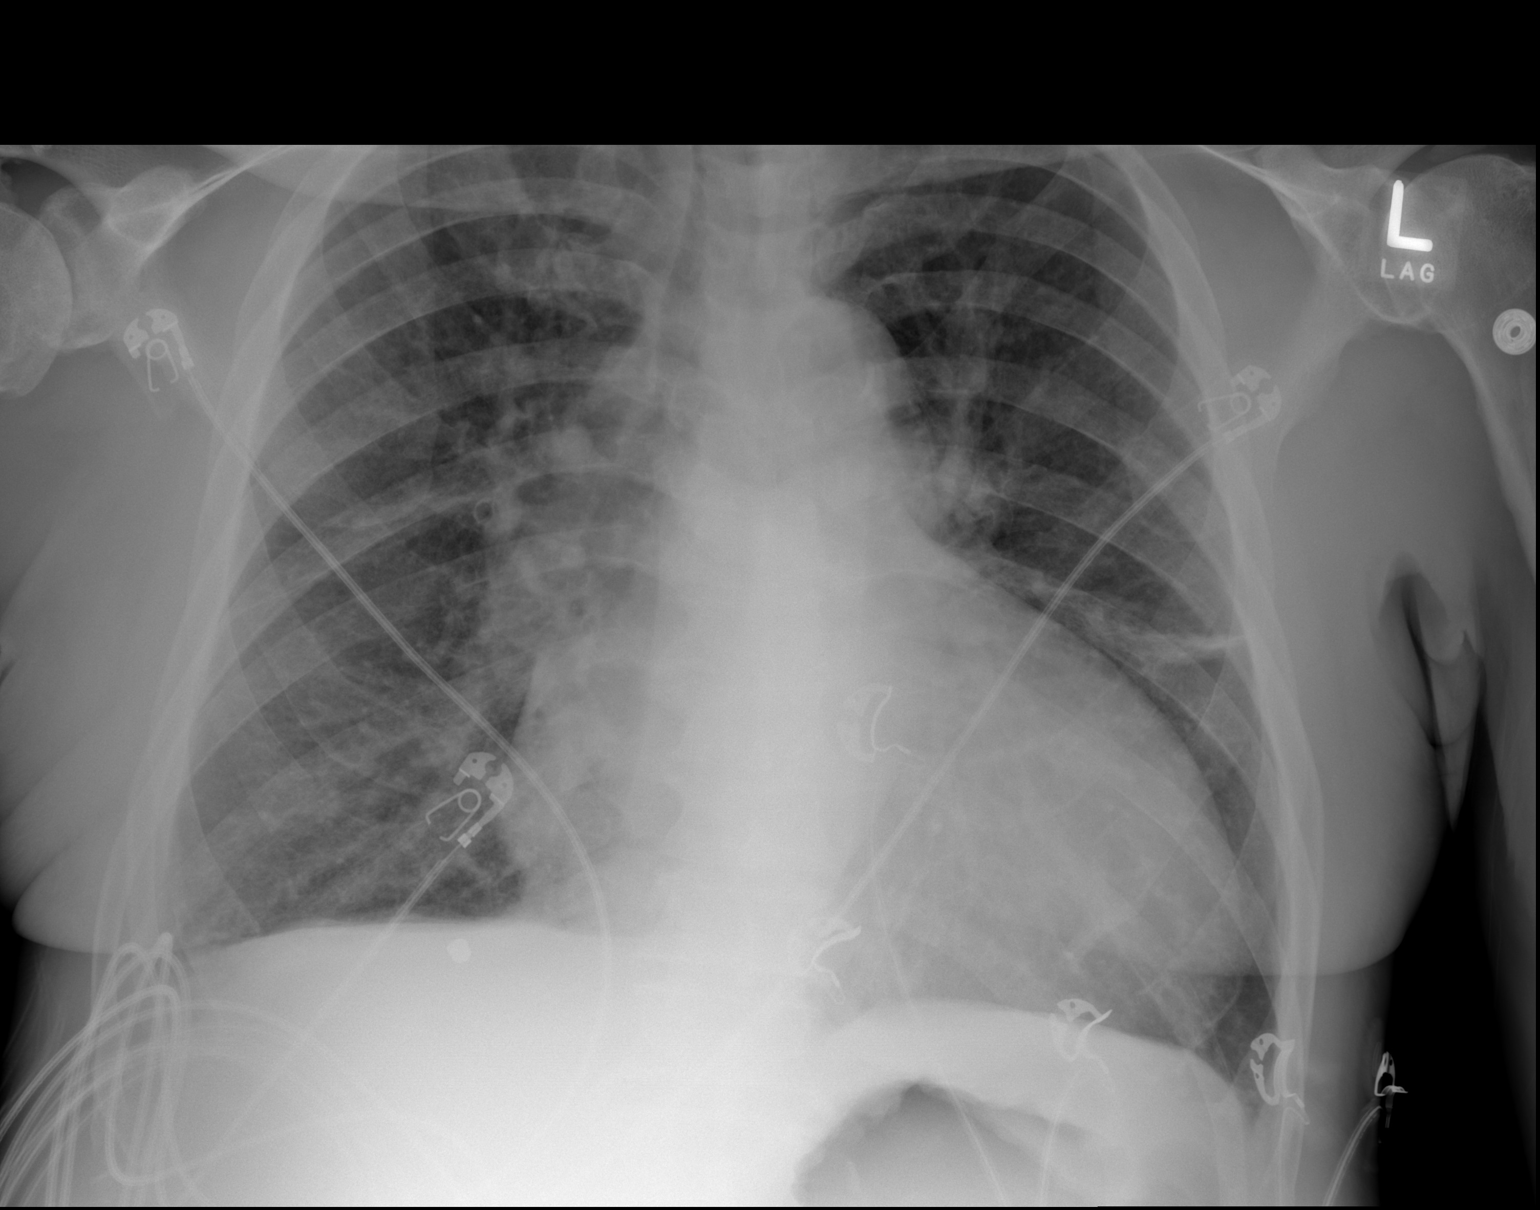

[w chest lat]
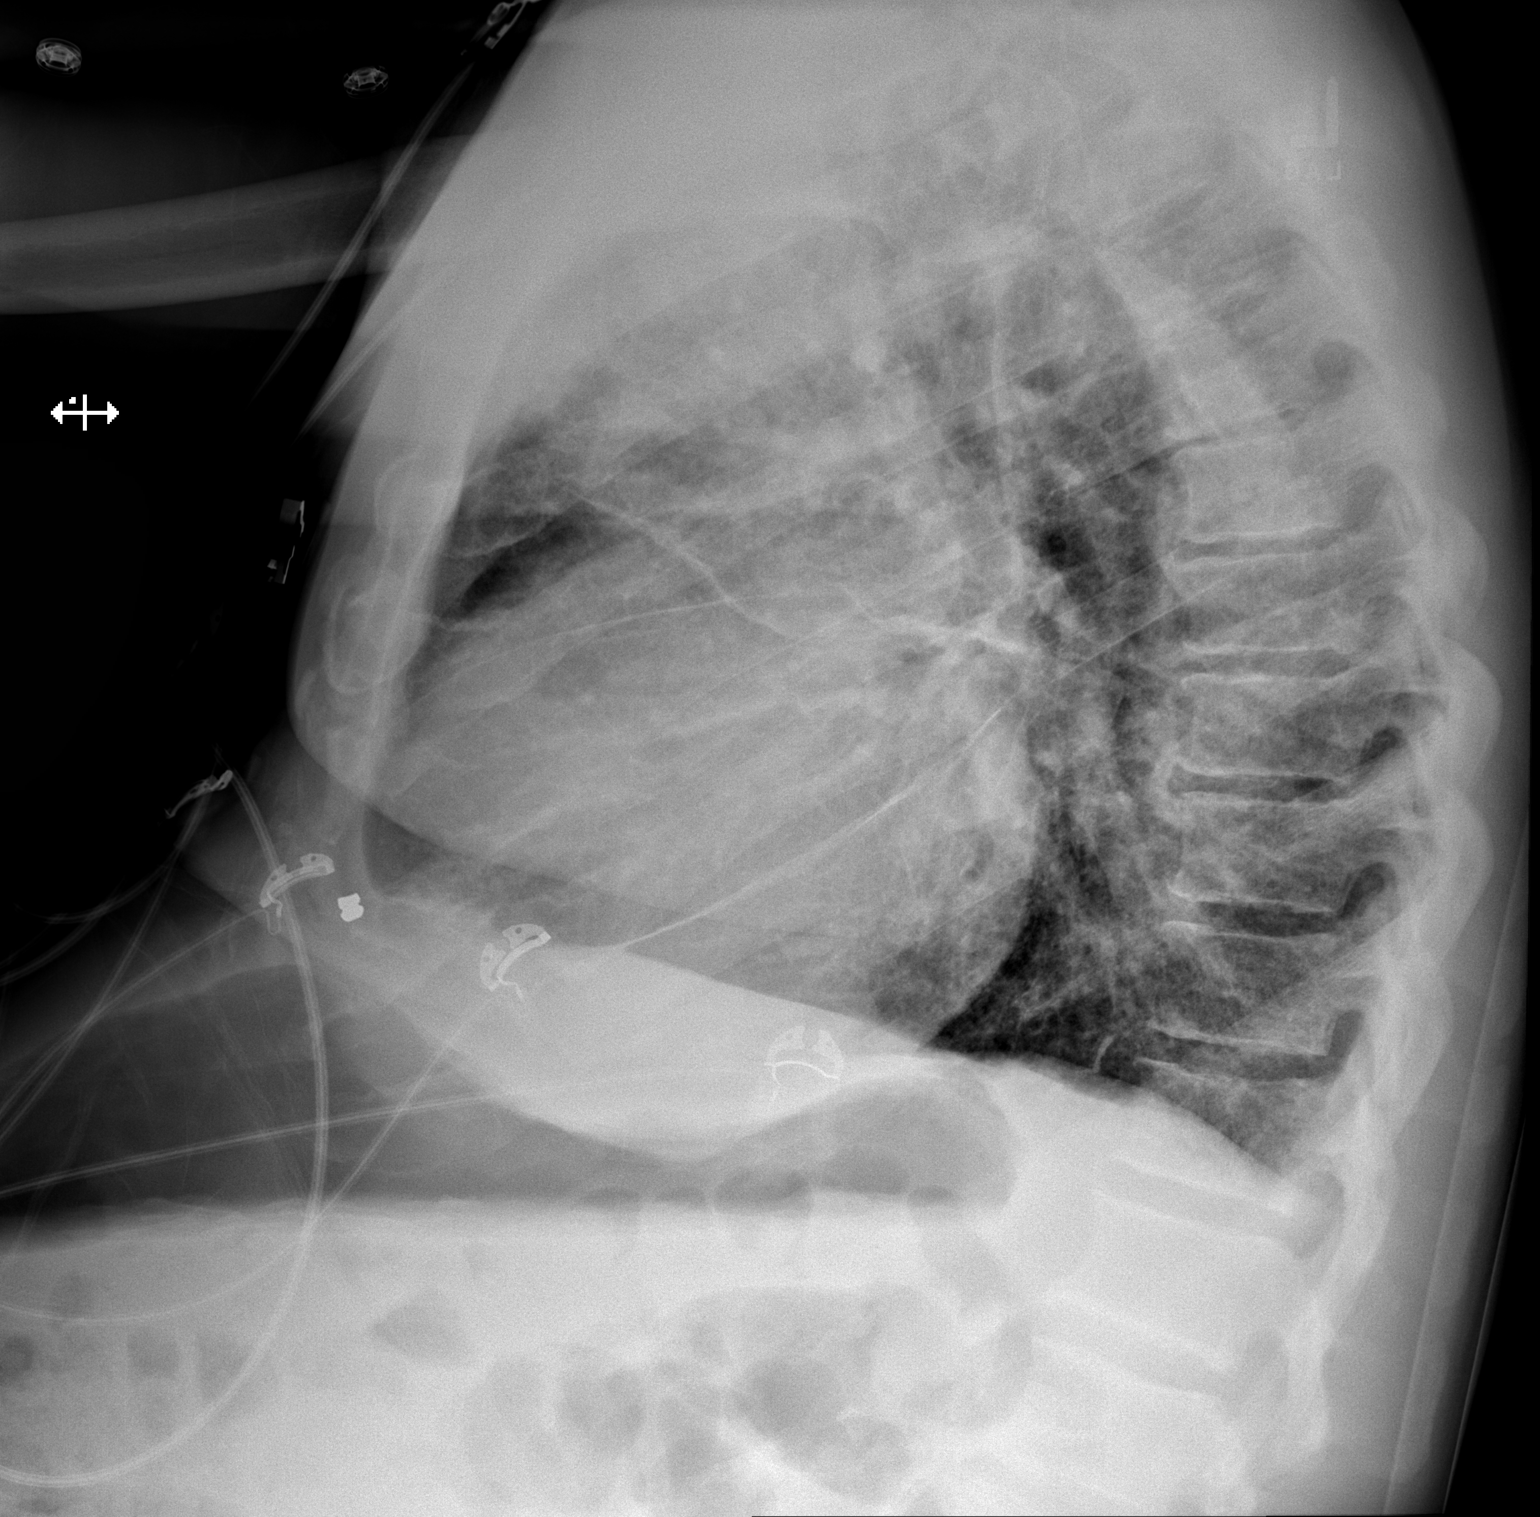

[2 of 2 positions shown; findings below may reference images not displayed]

FINDINGS: Prominent interstitial markings. Mild lingular and right infrahilar
opacities. The overall appearance suggests mild perihilar edema. No
definite pleural effusions. No pneumothorax.

Cardiomegaly.

Mild degenerative changes of the visualized thoracolumbar spine.
IMPRESSION: Cardiomegaly with suspected mild perihilar edema.

## 2019-01-26 MED ORDER — ACETAMINOPHEN 325 MG PO TABS: 650.0000 mg | ORAL_TABLET | Freq: Once | ORAL | Status: DC

## 2019-01-26 MED ORDER — SODIUM CHLORIDE 0.9% FLUSH: 3.0000 mL | Freq: Once | INTRAVENOUS | Status: DC

## 2019-01-26 NOTE — Discharge Instructions (Signed)
Be sure to take your medications.  Follow-up with your primary care provider

## 2019-01-26 NOTE — ED Provider Notes (Signed)
[facility]   CSN: [id number] Arrival date & time: 01/26/19  1055    History   Chief Complaint Chief Complaint  Patient presents with  . Leg Swelling    HPI Taylor MemosFrederick L Scarpulla is a 58 y.o. male.     HPI   [name] is a 58 y.o. male, with a history of chronic pain, cocaine abuse, DM, homelessness, schizophrenia, CHF, HTN, presenting to the ED primarily requesting food and Tylenol for chronic pain in his legs. He did not want to give me much more information than this.  He denies fever/chills, chest pain, increased shortness of breath, increased lower extremity swelling, cough, abdominal pain, syncope, or any other complaints.     Past Medical History:  Diagnosis Date  . Chronic foot pain   . Cocaine abuse (HCC)   . Diabetes mellitus without complication (HCC)   . Hepatitis C    unsure   . Homelessness   . Hypertension   . Neuropathy   . Polysubstance abuse (HCC)   . Schizophrenia (HCC)   . Sleep apnea   . Systolic and diastolic CHF, chronic [facility](HCC)     Patient Active Problem List   Diagnosis Date Noted  . Combined systolic and diastolic heart failure (HCC) 01/19/2019  . Chest pain 01/16/2019  . 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  . Medically noncompliant   . Acute on chronic systolic CHF (congestive heart failure) (HCC) 01/02/2019  . 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 Acute on chronic systolic heart failure (HCC) 12/26/2018  . Mobitz type II atrioventricular block 12/25/2018  . Acute exacerbation of CHF (congestive heart failure) (HCC) 12/22/2018  . Acute on chronic respiratory failure with hypoxia (HCC) 12/22/2018  . CHF exacerbation (HCC) 12/19/2018  . Acute CHF (congestive heart failure) (HCC) 12/16/2018  . Diabetes mellitus without complication (HCC) 12/11/2018  . Acute on chronic systolic (congestive) heart failure (HCC)  12/11/2018  . Abdominal pain 12/11/2018  . Nausea vomiting and diarrhea 12/11/2018  . Microcytic anemia 12/11/2018  . Evaluation by psychiatric service required   . MDD (major depressive disorder), severe (HCC) 11/25/2018  . Pressure injury of skin 11/08/2018  . Elevated troponin 10/18/2018  . HLD (hyperlipidemia) 10/18/2018  . Anxiety 10/18/2018  . CHF (congestive heart failure), NYHA class II, acute on chronic, combined (HCC) 10/18/2018  . Hypertensive urgency 10/12/2018  . Mild renal insufficiency 10/12/2018  . Cellulitis 10/12/2018  . Penile cellulitis   . Adjustment disorder with mixed disturbance of emotions and conduct   . Sleep apnea 10/03/2018  . Hypokalemia 09/17/2018  . Scrotal swelling 09/17/2018  . Urinary hesitancy   . Constipation   . Acute on chronic combined systolic and diastolic CHF (congestive heart failure) (HCC) 08/25/2018  . Tobacco use 08/18/2018  . Homelessness 08/08/2018  . Smoker 08/08/2018  . Prostate enlargement 03/16/2018  . Aortic atherosclerosis (HCC) 03/16/2018  . 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 Chronic foot pain   . Schizoaffective disorder, bipolar type (HCC) 09/30/2016  . Substance induced mood disorder (HCC) 03/13/2015  . Schizophrenia, paranoid type (HCC) 01/17/2015  . Drug hallucinosis (HCC) 10/08/2014  . Chronic paranoid schizophrenia (HCC) 09/07/2014  . 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 Urinary retention   . Cocaine use disorder, severe, dependence (HCC)   . Essential hypertension 03/28/2013  . Insulin-requiring or dependent type II diabetes mellitus (HCC) 03/15/2013  Past Surgical History:  Procedure Laterality Date  . MULTIPLE TOOTH EXTRACTIONS          Home Medications    Prior to Admission medications   Medication Sig Start Date End Date Taking? Authorizing Provider  acetaminophen (TYLENOL) 500 MG tablet Take 1 tablet (500 mg  total) by mouth every 6 (six) hours as needed. Patient not taking: Reported on 01/19/2019 01/08/19   Fayrene Helperran, Bowie, PA-C  ARIPiprazole (ABILIFY) 10 MG tablet Take 1 tablet (10 mg total) by mouth daily. Patient not taking: Reported on 01/13/2019 12/10/18   Joycelyn DasPokhrel, Laxman, MD  carbamazepine (TEGRETOL) 200 MG tablet Take 0.5 tablets (100 mg total) by mouth 2 (two) times daily. Patient not taking: Reported on 01/15/2019 11/26/18   Mancel BaleWentz, Elliott, MD  furosemide (LASIX) 40 MG tablet Take 1 tablet (40 mg total) by mouth 2 (two) times daily. Patient not taking: Reported on 01/19/2019 01/17/19 02/16/19  Alessandra BevelsKamineni, Neelima, MD  glipiZIDE (GLUCOTROL) 5 MG tablet Take 0.5 tablets (2.5 mg total) by mouth 2 (two) times daily before a meal. Patient not taking: Reported on 01/19/2019 01/01/19   Rai, Delene Ruffiniipudeep K, MD  Ipratropium-Albuterol (COMBIVENT) 20-100 MCG/ACT AERS respimat Inhale 1 puff into the lungs every 6 (six) hours. Patient not taking: Reported on 01/15/2019 11/30/18   Palumbo, April, MD  lisinopril (ZESTRIL) 10 MG tablet Take 1 tablet (10 mg total) by mouth daily. Patient not taking: Reported on 01/15/2019 11/26/18   Mancel BaleWentz, Elliott, MD  potassium chloride SA (K-DUR) 20 MEQ tablet Take 1 tablet (20 mEq total) by mouth 2 (two) times daily. Patient not taking: Reported on 01/15/2019 11/30/18   Nicanor AlconPalumbo, April, MD    Family History Family History  Problem Relation Age of Onset  . Hypertension Other   . Diabetes Other     Social History Social History   Tobacco Use  . Smoking status: Current Every Day Smoker    Packs/day: 1.00    Years: 20.00    Pack years: 20.00    Types: Cigarettes  . Smokeless tobacco: Current User  Substance Use Topics  . Alcohol use: Yes    Comment: Daily Drinker   . Drug use: Yes    Frequency: 7.0 times per week    Types: "Crack" cocaine, Cocaine, Marijuana    Comment: Crack     Allergies   Haldol [haloperidol]   Review of Systems Review of Systems  Constitutional: Negative  for chills, diaphoresis and fever.       Requesting food  Respiratory: Negative for cough and shortness of breath.   Cardiovascular: Negative for chest pain and leg swelling (none acute).  Gastrointestinal: Negative for abdominal pain, diarrhea, nausea and vomiting.  Musculoskeletal:       Chronic lower extremity pain  Neurological: Negative for syncope.  All other systems reviewed and are negative.    Physical Exam Updated Vital Signs BP (!) 162/90   Pulse (!) 103   Temp 98 F (36.7 C) (Oral)   Resp 19   SpO2 100%   Physical Exam Vitals signs and nursing note reviewed.  Constitutional:      General: He is not in acute distress.    Appearance: He is well-developed. He is not diaphoretic.  HENT:     Head: Normocephalic and atraumatic.     Mouth/Throat:     Mouth: Mucous membranes are moist.     Pharynx: Oropharynx is clear.  Eyes:     Conjunctiva/sclera: Conjunctivae normal.  Neck:     Musculoskeletal: Neck  supple.  Cardiovascular:     Rate and Rhythm: Normal rate and regular rhythm.     Pulses: Normal pulses.          Radial pulses are 2+ on the right side and 2+ on the left side.       Posterior tibial pulses are 2+ on the right side and 2+ on the left side.     Heart sounds: Normal heart sounds.     Comments: Tactile temperature in the extremities appropriate and equal bilaterally. Pulmonary:     Effort: Pulmonary effort is normal. No respiratory distress.     Breath sounds: Wheezing present.     Comments: Wheezing and some mild rales or rhonchi.  No noted increased work of breathing.  Speaks in full sentences without difficulty.  No orthopnea. Abdominal:     Palpations: Abdomen is soft.     Tenderness: There is no abdominal tenderness. There is no guarding.  Musculoskeletal:     Right lower leg: Edema present.     Left lower leg: Edema present.     Comments: Patient has minimal, if any, lower extremity edema.  Certainly nothing compared with what I have seen him  have in the past.  Lymphadenopathy:     Cervical: No cervical adenopathy.  Skin:    General: Skin is warm and dry.  Neurological:     Mental Status: He is alert.  Psychiatric:        Mood and Affect: Mood and affect normal.        Speech: Speech normal.        Behavior: Behavior normal.      ED Treatments / Results  Labs (all labs ordered are listed, but only abnormal results are displayed) Labs Reviewed  BASIC METABOLIC PANEL - Abnormal; Notable for the following components:      Result Value   Glucose, Bld 181 (*)    Calcium 8.6 (*)    All other components within normal limits  CBC - Abnormal; Notable for the following components:   RBC 3.82 (*)    Hemoglobin 8.3 (*)    HCT 28.8 (*)    MCV 75.4 (*)    MCH 21.7 (*)    MCHC 28.8 (*)    RDW 20.9 (*)    nRBC 0.5 (*)    All other components within normal limits  TROPONIN I (HIGH SENSITIVITY) - Abnormal; Notable for the following components:   Troponin I (High Sensitivity) 23 (*)    All other components within normal limits  TROPONIN I (HIGH SENSITIVITY) - Abnormal; Notable for the following components:   Troponin I (High Sensitivity) 21 (*)    All other components within normal limits   Hemoglobin  Date Value Ref Range Status  01/26/2019 8.3 (L) 13.0 - 17.0 g/dL Final    Comment:    Reticulocyte Hemoglobin testing may be clinically indicated, consider ordering this additional test [id number]   01/25/2019 9.0 (L) 13.0 - 17.0 g/dL Final  01/23/2019 8.9 (L) 13.0 - 17.0 g/dL Final  01/22/2019 8.4 (L) 13.0 - 17.0 g/dL Final    Comment:    Reticulocyte Hemoglobin testing may be clinically indicated, consider ordering this additional test [id number]      EKG None  Radiology Dg Chest Port 1 View  Result Date: 01/25/2019 CLINICAL DATA:  Shortness of breath. Chest pain. EXAM: PORTABLE CHEST 1 VIEW COMPARISON:  01/22/2019, 01/19/2019 and 01/16/2019 FINDINGS: Chronic cardiomegaly. Pulmonary vascularity is normal. No  infiltrates. Slight haziness at  the lung bases may represent tiny effusions. No acute bone abnormality. IMPRESSION: Possible tiny effusions. Chronic cardiomegaly. Electronically Signed   By: Francene BoyersJames  Maxwell M.D.   On: 01/25/2019 11:48    Procedures Procedures (including critical care time)  Medications Ordered in ED Medications  sodium chloride flush (NS) 0.9 % injection 3 mL (has no administration in time range)  acetaminophen (TYLENOL) tablet 650 mg (has no administration in time range)     Initial Impression / Assessment and Plan / ED Course  I have reviewed the triage vital signs and the nursing notes.  Pertinent labs & imaging results that were available during my care of the patient were reviewed by me and considered in my medical decision making (see chart for details).  Clinical Course as of Jan 25 1800  Tue Jan 26, 2019  1707 Improved from previous values, noted to be 36 ten days ago.  Troponin I (High Sensitivity)(!): 23 [SJ]  1725 Patient is being verbally abusive, using foul language, yelling at staff because he is not getting a hot meal.  He is demanding Tylenol and to leave.   [SJ]    Clinical Course User Index [SJ] Doren Kaspar C, PA-C       Patient presents with request for food and Tylenol.  He did not want any further evaluation or testing.  He became verbally abusive with staff when he did not get a hot meal.  Lab results reassuring and abnormalities consistent with, or improved from, previous values. He was discharged and encouraged to follow-up with a primary care provider.  Findings and plan of care discussed with Margarita Grizzleanielle Ray, MD.   Vitals:   01/26/19 1653 01/26/19 1655 01/26/19 1700 01/26/19 1715  BP:   (!) 161/108 (!) 162/90  Pulse: 98 100 99 (!) 103  Resp: 20 (!) 24 19   Temp:      TempSrc:      SpO2: 100% 100% 100% 100%     Final Clinical Impressions(s) / ED Diagnoses   Final diagnoses:  Chronic edema    ED Discharge Orders    None        Concepcion LivingJoy, Shamyra Farias C, PA-C 01/26/19 1801    Margarita Grizzleay, Danielle, MD 01/28/19 1538

## 2019-01-26 NOTE — ED Notes (Signed)
Pt walked outside to sit until room ready.

## 2019-01-26 NOTE — ED Notes (Signed)
Pt escorted to waiting room at point of d/c by security and [employer]. VSS, NAD

## 2019-01-26 NOTE — ED Triage Notes (Signed)
[facility] with c/o of chronic abd swelling and leg swelling. Was seen I  Ed for same yesterday pt reports leaving ama. Pt is alert and ox4, no distress noted.

## 2019-01-27 ENCOUNTER — Other Ambulatory Visit: Payer: Self-pay

## 2019-01-27 ENCOUNTER — Encounter (HOSPITAL_COMMUNITY): Payer: Self-pay | Admitting: Emergency Medicine

## 2019-01-27 DIAGNOSIS — I5042 Chronic combined systolic (congestive) and diastolic (congestive) heart failure: Secondary | ICD-10-CM | POA: Insufficient documentation

## 2019-01-27 DIAGNOSIS — R600 Localized edema: Secondary | ICD-10-CM | POA: Insufficient documentation

## 2019-01-27 DIAGNOSIS — E119 Type 2 diabetes mellitus without complications: Secondary | ICD-10-CM | POA: Insufficient documentation

## 2019-01-27 DIAGNOSIS — I110 Hypertensive heart disease with heart failure: Secondary | ICD-10-CM | POA: Diagnosis not present

## 2019-01-27 DIAGNOSIS — F17210 Nicotine dependence, cigarettes, uncomplicated: Secondary | ICD-10-CM | POA: Insufficient documentation

## 2019-01-27 NOTE — ED Triage Notes (Signed)
[facility] for leg swelling and genital pains that is chronic. Told EMS that he can't take diuretics bc makes him use the bathroom.

## 2019-01-28 ENCOUNTER — Emergency Department (HOSPITAL_COMMUNITY)
Admission: EM | Admit: 2019-01-28 | Discharge: 2019-01-28 | Disposition: A | Discharge status: Home / Self Care | Payer: Medicaid Other | Attending: Emergency Medicine | Admitting: Emergency Medicine

## 2019-01-28 DIAGNOSIS — R609 Edema, unspecified: Secondary | ICD-10-CM

## 2019-01-28 NOTE — ED Provider Notes (Signed)
[place] DEPT Provider Note   CSN: [id number] Arrival date & time: 01/27/19  1520     History   Chief Complaint Chief Complaint  Patient presents with  . Leg Swelling    HPI [name] is a 58 y.o. male.     HPI Patient presents for his 102nd ER visit. He reports leg swelling and swelling in his scrotum.  He is requesting food, ice pack, Tylenol Past Medical History:  Diagnosis Date  . Chronic foot pain   . Cocaine abuse ([place])   . Diabetes mellitus without complication ([place])   . Hepatitis C    unsure   . Homelessness   . Hypertension   . Neuropathy   . Polysubstance abuse ([place])   . Schizophrenia ([place])   . Sleep apnea   . Systolic and diastolic CHF, chronic [facility])     Patient Active Problem List   Diagnosis Date Noted  . Combined systolic and diastolic heart failure ([place]) 01/19/2019  . Chest pain 01/16/2019  . 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  . Medically noncompliant   . Acute on chronic systolic CHF (congestive heart failure) ([place]) 01/02/2019  . Acute on chronic combined systolic (congestive) and diastolic (congestive) heart failure ([place]) 01/02/2019  . Acute on chronic systolic heart failure ([place]) 12/26/2018  . Mobitz type II atrioventricular block 12/25/2018  . Acute exacerbation of CHF (congestive heart failure) ([place]) 12/22/2018  . Acute on chronic respiratory failure with hypoxia ([name]) 12/22/2018  . CHF exacerbation ([place]) 12/19/2018  . Acute CHF (congestive heart failure) ([place]) 12/16/2018  . Diabetes mellitus without complication ([place]) 05/39/7673  . Acute on chronic systolic (congestive) heart failure ([place]) 12/11/2018  . Abdominal pain 12/11/2018  . Nausea vomiting and diarrhea 12/11/2018  . Microcytic anemia 12/11/2018  . Evaluation by psychiatric service required   . MDD (major depressive disorder), severe ([place]) 11/25/2018  . Pressure injury of skin 11/08/2018  . Elevated troponin 10/18/2018  . HLD (hyperlipidemia)  10/18/2018  . Anxiety 10/18/2018  . CHF (congestive heart failure), NYHA class II, acute on chronic, combined ([place]) 10/18/2018  . Hypertensive urgency 10/12/2018  . Mild renal insufficiency 10/12/2018  . Cellulitis 10/12/2018  . Penile cellulitis   . Adjustment disorder with mixed disturbance of emotions and conduct   . Sleep apnea 10/03/2018  . Hypokalemia 09/17/2018  . Scrotal swelling 09/17/2018  . Urinary hesitancy   . Constipation   . Acute on chronic combined systolic and diastolic CHF (congestive heart failure) ([place]) 08/25/2018  . Tobacco use 08/18/2018  . Homelessness 08/08/2018  . Smoker 08/08/2018  . Prostate enlargement 03/16/2018  . Aortic atherosclerosis ([place]) 03/16/2018  . 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 Chronic foot pain   . Schizoaffective disorder, bipolar type ([place]) 09/30/2016  . Substance induced mood disorder ([place]) 03/13/2015  . Schizophrenia, paranoid type ([place]) 01/17/2015  . Drug hallucinosis ([place]) 10/08/2014  . Chronic paranoid schizophrenia ([place]) 09/07/2014  . 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 Urinary retention   . Cocaine use disorder, severe, dependence ([place])   . Essential hypertension 03/28/2013  . Insulin-requiring or dependent type II diabetes mellitus ([place]) 03/15/2013    Past Surgical History:  Procedure Laterality Date  . MULTIPLE TOOTH EXTRACTIONS          Home Medications    Prior to Admission medications   Medication Sig Start Date End Date Taking? Authorizing Provider  acetaminophen (TYLENOL) 500 MG tablet Take 1  tablet (500 mg total) by mouth every 6 (six) hours as needed. Patient not taking: Reported on 01/19/2019 01/08/19   Fayrene Helperran, [place]ie, PA-C  ARIPiprazole (ABILIFY) 10 MG tablet Take 1 tablet (10 mg total) by mouth daily. Patient not taking: Reported on 01/13/2019 12/10/18   Joycelyn DasPokhrel, Laxman, MD  carbamazepine (TEGRETOL) 200 MG tablet Take 0.5  tablets (100 mg total) by mouth 2 (two) times daily. Patient not taking: Reported on 01/15/2019 11/26/18   Mancel BaleWentz, Elliott, MD  furosemide (LASIX) 40 MG tablet Take 1 tablet (40 mg total) by mouth 2 (two) times daily. Patient not taking: Reported on 01/19/2019 01/17/19 02/16/19  Alessandra BevelsKamineni, Neelima, MD  glipiZIDE (GLUCOTROL) 5 MG tablet Take 0.5 tablets (2.5 mg total) by mouth 2 (two) times daily before a meal. Patient not taking: Reported on 01/19/2019 01/01/19   Rai, Delene Ruffiniipudeep K, MD  Ipratropium-Albuterol (COMBIVENT) 20-100 MCG/ACT AERS respimat Inhale 1 puff into the lungs every 6 (six) hours. Patient not taking: Reported on 01/15/2019 11/30/18   Palumbo, April, MD  lisinopril (ZESTRIL) 10 MG tablet Take 1 tablet (10 mg total) by mouth daily. Patient not taking: Reported on 01/15/2019 11/26/18   Mancel BaleWentz, Elliott, MD  potassium chloride SA (K-DUR) 20 MEQ tablet Take 1 tablet (20 mEq total) by mouth 2 (two) times daily. Patient not taking: Reported on 01/15/2019 11/30/18   Nicanor AlconPalumbo, April, MD    Family History Family History  Problem Relation Age of Onset  . Hypertension Other   . Diabetes Other     Social History Social History   Tobacco Use  . Smoking status: Current Every Day Smoker    Packs/day: 1.00    Years: 20.00    Pack years: 20.00    Types: Cigarettes  . Smokeless tobacco: Current User  Substance Use Topics  . Alcohol use: Yes    Comment: Daily Drinker   . Drug use: Yes    Frequency: 7.0 times per week    Types: "Crack" cocaine, Cocaine, Marijuana    Comment: Crack     Allergies   Haldol [haloperidol]   Review of Systems Review of Systems  Constitutional: Negative for fever.  Cardiovascular: Positive for leg swelling.  Genitourinary: Negative for difficulty urinating.     Physical Exam Updated Vital Signs BP (!) 167/97   Pulse 100   Temp 98.9 F (37.2 C) (Oral)   Resp 20   SpO2 97%   Physical Exam CONSTITUTIONAL: Disheveled HEAD: Normocephalic/atraumatic EYES:  EOMI ENMT: Mucous membranes moist NECK: supple no meningeal signs LUNGS:  no apparent distress ABDOMEN: soft, nontender GU: Chronic scrotal edema NEURO: Pt is awake/alert/appropriate, moves all extremitiesx4.  No facial droop.   EXTREMITIES: pulses normal/equal, full ROM, symmetric bilateral lower extremity edema SKIN: warm, color normal PSYCH: Anxious ED Treatments / Results  Labs (all labs ordered are listed, but only abnormal results are displayed) Labs Reviewed - No data to display  EKG None  Radiology No results found.  Procedures Procedures (including critical care time)  Medications Ordered in ED Medications - No data to display   Initial Impression / Assessment and Plan / ED Course  I have reviewed the triage vital signs and the nursing notes.  Patient appears at baseline.  Will discharge  Final Clinical Impressions(s) / ED Diagnoses   Final diagnoses:  Peripheral edema    ED Discharge Orders    None       Zadie RhineWickline, Delmas Faucett, MD 01/28/19 [phone]

## 2019-01-28 NOTE — ED Notes (Signed)
Pt. Given ice pack per EDP,Wickline,MD.

## 2019-01-28 NOTE — ED Notes (Signed)
Pt provided with milkx2, crackers x4, orange juice x2, clean clothing and Ice Pack

## 2019-01-29 ENCOUNTER — Emergency Department (HOSPITAL_COMMUNITY)
Admission: EM | Admit: 2019-01-29 | Discharge: 2019-01-29 | Disposition: A | Discharge status: Home / Self Care | Payer: Medicare Other | Source: Home / Self Care | Attending: Emergency Medicine | Admitting: Emergency Medicine

## 2019-01-29 ENCOUNTER — Inpatient Hospital Stay (HOSPITAL_COMMUNITY)
Admission: EM | Admit: 2019-01-29 | Discharge: 2019-01-31 | DRG: 291 | Discharge status: Against Medical Advice | Payer: Medicare Other | Attending: Internal Medicine | Admitting: Internal Medicine

## 2019-01-29 DIAGNOSIS — Z9119 Patient's noncompliance with other medical treatment and regimen: Secondary | ICD-10-CM

## 2019-01-29 DIAGNOSIS — I5043 Acute on chronic combined systolic (congestive) and diastolic (congestive) heart failure: Secondary | ICD-10-CM

## 2019-01-29 DIAGNOSIS — Z7984 Long term (current) use of oral hypoglycemic drugs: Secondary | ICD-10-CM

## 2019-01-29 DIAGNOSIS — I110 Hypertensive heart disease with heart failure: Secondary | ICD-10-CM | POA: Diagnosis not present

## 2019-01-29 DIAGNOSIS — R600 Localized edema: Secondary | ICD-10-CM | POA: Insufficient documentation

## 2019-01-29 DIAGNOSIS — Z5900 Homelessness unspecified: Secondary | ICD-10-CM

## 2019-01-29 DIAGNOSIS — I700 Atherosclerosis of aorta: Secondary | ICD-10-CM | POA: Diagnosis present

## 2019-01-29 DIAGNOSIS — E1165 Type 2 diabetes mellitus with hyperglycemia: Secondary | ICD-10-CM | POA: Diagnosis present

## 2019-01-29 DIAGNOSIS — I5042 Chronic combined systolic (congestive) and diastolic (congestive) heart failure: Secondary | ICD-10-CM | POA: Insufficient documentation

## 2019-01-29 DIAGNOSIS — R609 Edema, unspecified: Secondary | ICD-10-CM

## 2019-01-29 DIAGNOSIS — F1010 Alcohol abuse, uncomplicated: Secondary | ICD-10-CM | POA: Diagnosis present

## 2019-01-29 DIAGNOSIS — J9621 Acute and chronic respiratory failure with hypoxia: Secondary | ICD-10-CM | POA: Diagnosis present

## 2019-01-29 DIAGNOSIS — I10 Essential (primary) hypertension: Secondary | ICD-10-CM | POA: Insufficient documentation

## 2019-01-29 DIAGNOSIS — G4733 Obstructive sleep apnea (adult) (pediatric): Secondary | ICD-10-CM | POA: Diagnosis present

## 2019-01-29 DIAGNOSIS — R45851 Suicidal ideations: Secondary | ICD-10-CM

## 2019-01-29 DIAGNOSIS — Z888 Allergy status to other drugs, medicaments and biological substances status: Secondary | ICD-10-CM

## 2019-01-29 DIAGNOSIS — Z20828 Contact with and (suspected) exposure to other viral communicable diseases: Secondary | ICD-10-CM | POA: Diagnosis present

## 2019-01-29 DIAGNOSIS — Z765 Malingerer [conscious simulation]: Secondary | ICD-10-CM

## 2019-01-29 DIAGNOSIS — Z8249 Family history of ischemic heart disease and other diseases of the circulatory system: Secondary | ICD-10-CM

## 2019-01-29 DIAGNOSIS — F1290 Cannabis use, unspecified, uncomplicated: Secondary | ICD-10-CM | POA: Diagnosis present

## 2019-01-29 DIAGNOSIS — F17210 Nicotine dependence, cigarettes, uncomplicated: Secondary | ICD-10-CM | POA: Insufficient documentation

## 2019-01-29 DIAGNOSIS — E785 Hyperlipidemia, unspecified: Secondary | ICD-10-CM | POA: Diagnosis present

## 2019-01-29 DIAGNOSIS — F1414 Cocaine abuse with cocaine-induced mood disorder: Secondary | ICD-10-CM

## 2019-01-29 DIAGNOSIS — N5089 Other specified disorders of the male genital organs: Secondary | ICD-10-CM

## 2019-01-29 DIAGNOSIS — Z716 Tobacco abuse counseling: Secondary | ICD-10-CM

## 2019-01-29 DIAGNOSIS — Z833 Family history of diabetes mellitus: Secondary | ICD-10-CM

## 2019-01-29 DIAGNOSIS — F200 Paranoid schizophrenia: Secondary | ICD-10-CM | POA: Diagnosis present

## 2019-01-29 DIAGNOSIS — E119 Type 2 diabetes mellitus without complications: Secondary | ICD-10-CM | POA: Insufficient documentation

## 2019-01-29 DIAGNOSIS — Z79899 Other long term (current) drug therapy: Secondary | ICD-10-CM

## 2019-01-29 DIAGNOSIS — E1140 Type 2 diabetes mellitus with diabetic neuropathy, unspecified: Secondary | ICD-10-CM | POA: Diagnosis present

## 2019-01-29 DIAGNOSIS — J9601 Acute respiratory failure with hypoxia: Secondary | ICD-10-CM

## 2019-01-29 DIAGNOSIS — I509 Heart failure, unspecified: Secondary | ICD-10-CM

## 2019-01-29 LAB — COMPREHENSIVE METABOLIC PANEL
ALT: 11 U/L (ref 0–44)
AST: 22 U/L (ref 15–41)
Albumin: 2.6 g/dL — ABNORMAL LOW (ref 3.5–5.0)
Alkaline Phosphatase: 171 U/L — ABNORMAL HIGH (ref 38–126)
Anion gap: 8 (ref 5–15)
BUN: 13 mg/dL (ref 6–20)
CO2: 26 mmol/L (ref 22–32)
Calcium: 8.6 mg/dL — ABNORMAL LOW (ref 8.9–10.3)
Chloride: 104 mmol/L (ref 98–111)
Creatinine, Ser: 0.99 mg/dL (ref 0.61–1.24)
GFR calc Af Amer: >60 mL/min (ref >60)
GFR calc non Af Amer: >60 mL/min (ref >60)
Glucose, Bld: 215 mg/dL — ABNORMAL HIGH (ref 70–99)
Potassium: 3.5 mmol/L (ref 3.5–5.1)
Sodium: 138 mmol/L (ref 135–145)
Total Bilirubin: 0.8 mg/dL (ref 0.3–1.2)
Total Protein: 6.8 g/dL (ref 6.5–8.1)

## 2019-01-29 LAB — CBC
HCT: 30.7 % — ABNORMAL LOW (ref 39.0–52.0)
Hemoglobin: 8.6 g/dL — ABNORMAL LOW (ref 13.0–17.0)
MCH: 21.7 pg — ABNORMAL LOW (ref 26.0–34.0)
MCHC: 28 g/dL — ABNORMAL LOW (ref 30.0–36.0)
MCV: 77.5 fL — ABNORMAL LOW (ref 80.0–100.0)
Platelets: 294 10*3/uL (ref 150–400)
RBC: 3.96 MIL/uL — ABNORMAL LOW (ref 4.22–5.81)
RDW: 21.1 % — ABNORMAL HIGH (ref 11.5–15.5)
WBC: 8.3 10*3/uL (ref 4.0–10.5)
nRBC: 0.4 % — ABNORMAL HIGH (ref 0.0–0.2)

## 2019-01-29 IMAGING — DX DG ABD PORTABLE 2V
2 series · 2 of 2 positions shown · non-contrast
Comparison: 02/24/2018

CLINICAL DATA: Abdominal pain. History of hypertension and
diabetes.

EXAM:
PORTABLE ABDOMEN - 2 VIEW

[abdomen erect]
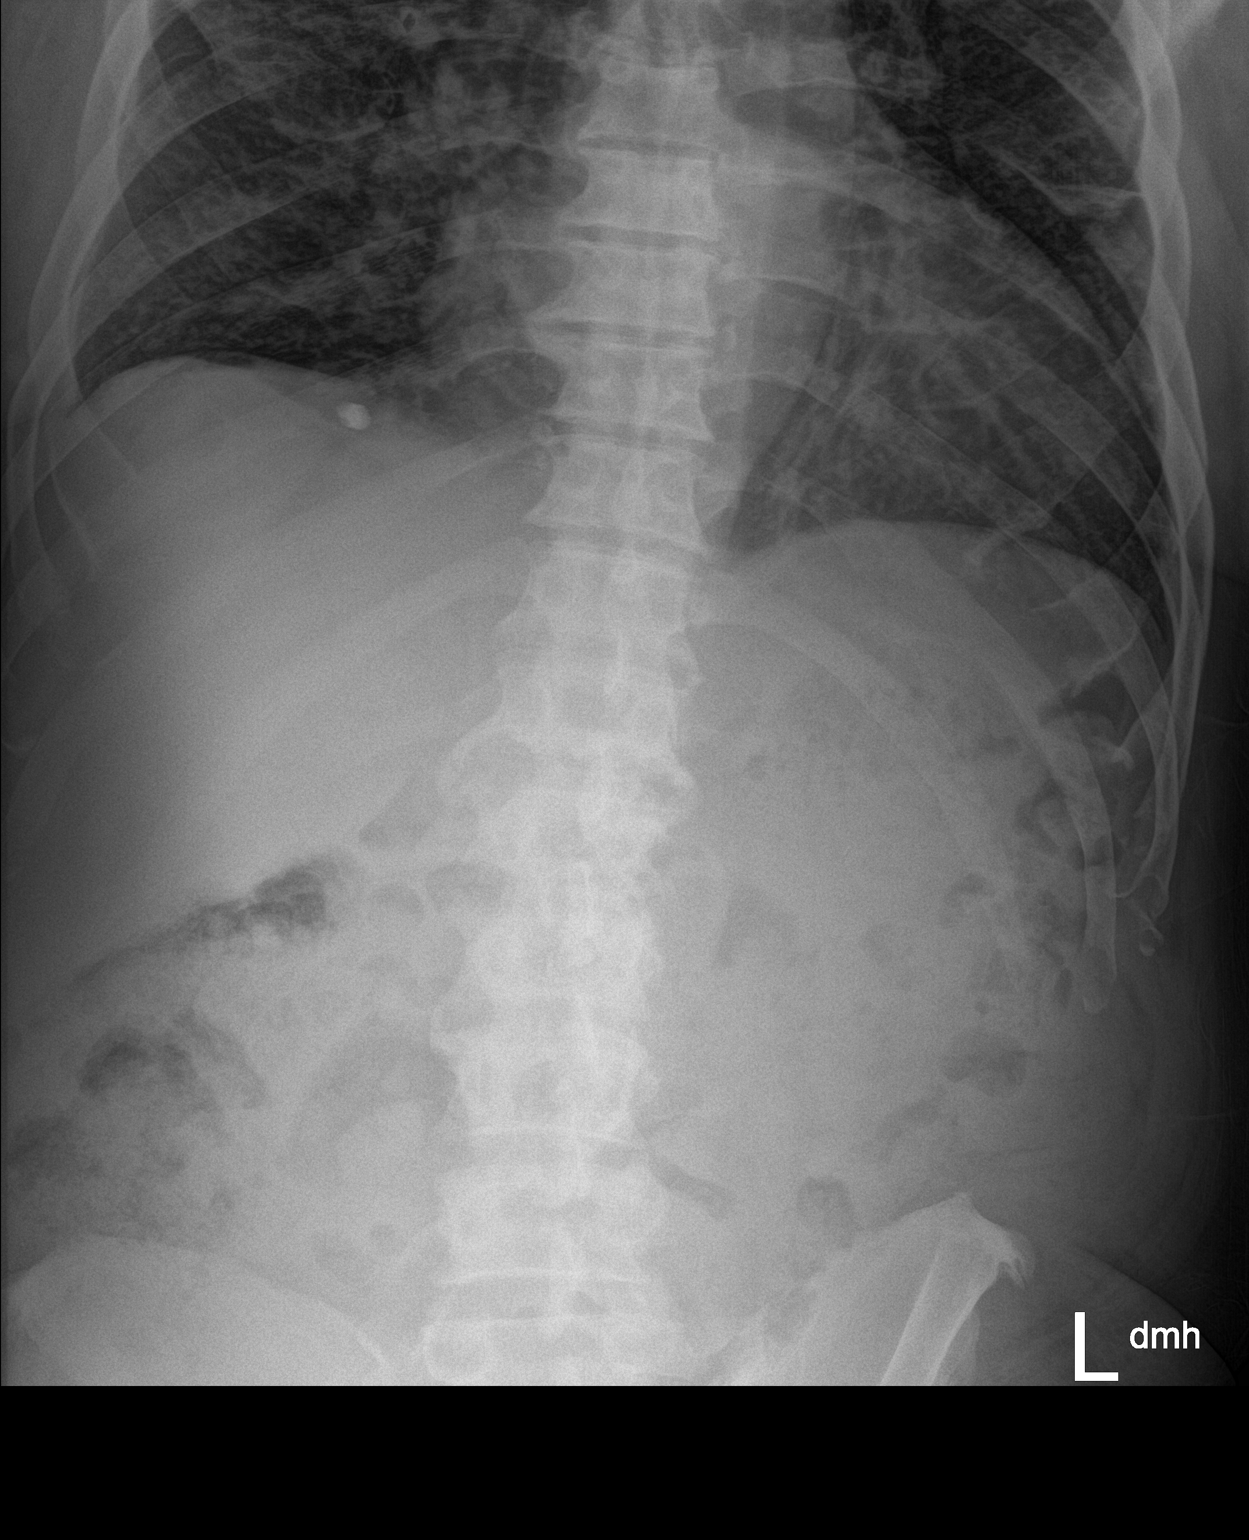

[abdomen supine]
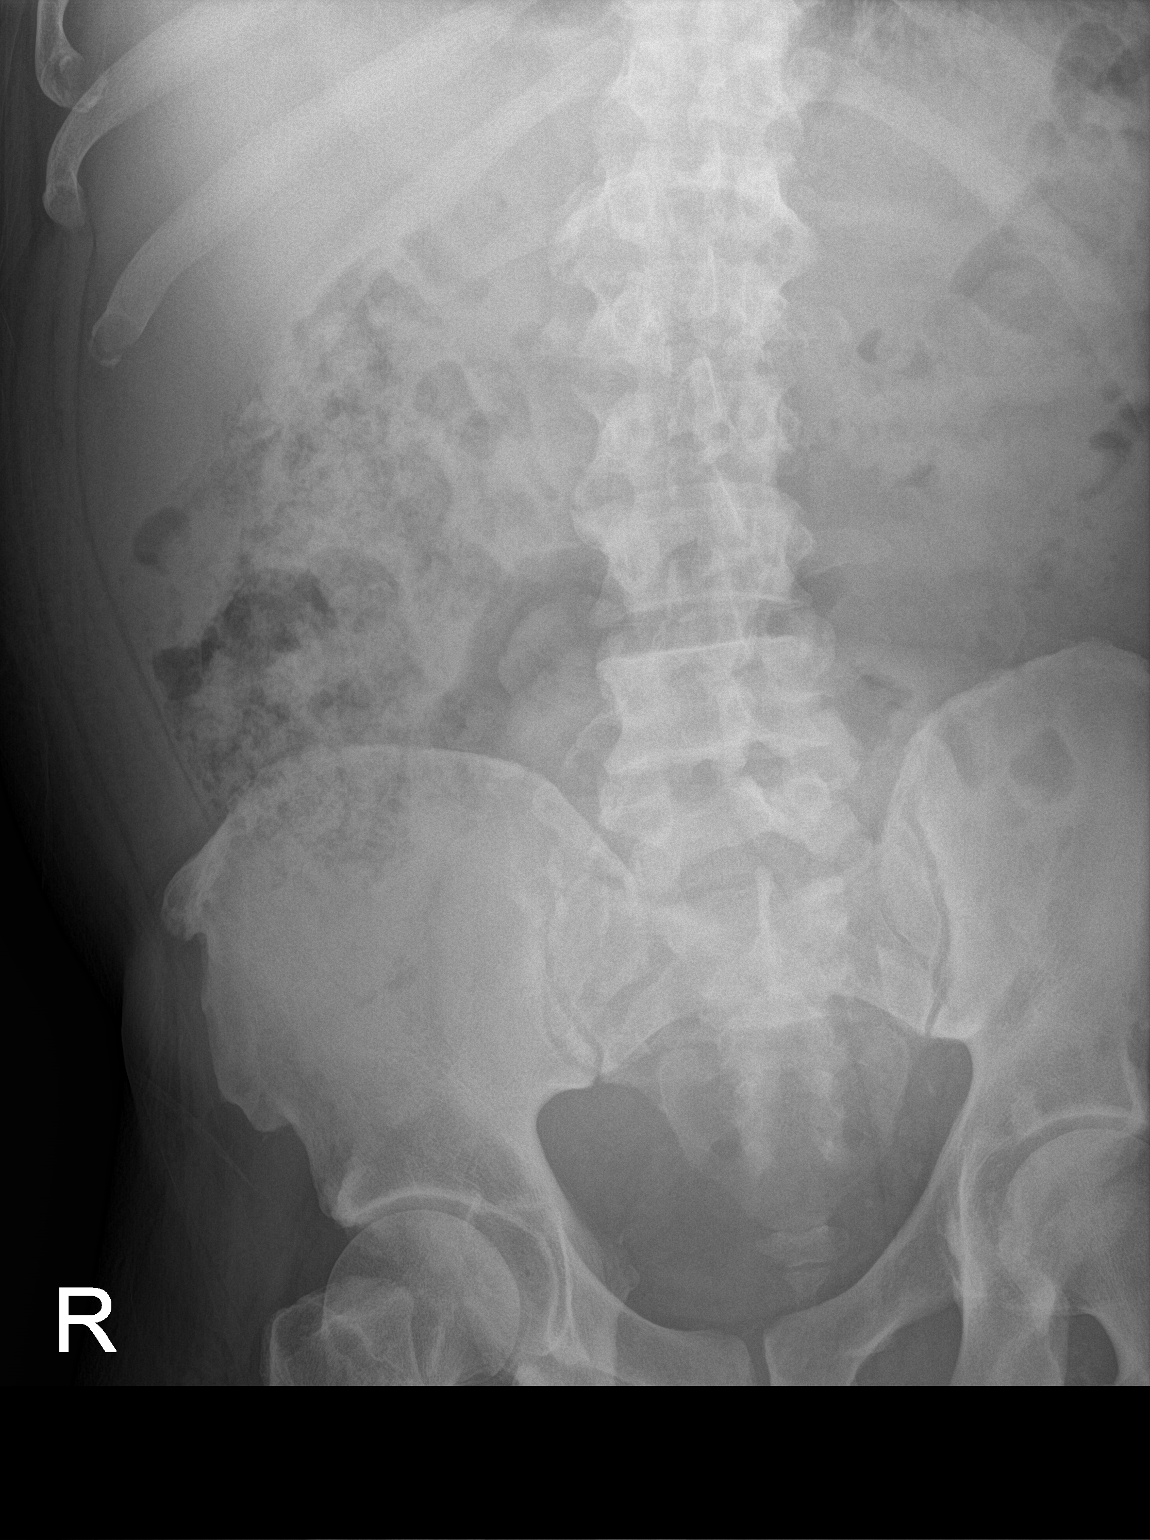

[2 of 2 positions shown; findings below may reference images not displayed]

FINDINGS: There is no bowel dilation to suggest obstruction. No air-fluid
levels or free air. No adynamic ileus.

Mild to moderate generalized increased stool burden in the colon.

No evidence of renal or ureteral stones.

No acute skeletal abnormality.
IMPRESSION: 1. No acute findings.  No evidence of bowel obstruction or free air.
2. Mild to moderate increased stool burden throughout the colon.

## 2019-01-29 MED ORDER — FUROSEMIDE 40 MG PO TABS: 40.0000 mg | ORAL_TABLET | Freq: Two times a day (BID) | ORAL | 0 refills | Status: DC

## 2019-01-29 MED ORDER — ACETAMINOPHEN 325 MG PO TABS
650.0000 mg | ORAL_TABLET | Freq: Once | ORAL | Status: AC
  Administered 2019-01-29: 09:00:00 650 mg via ORAL
  Filled 2019-01-29: qty 2

## 2019-01-29 MED ORDER — FUROSEMIDE 10 MG/ML IJ SOLN
40.0000 mg | Freq: Once | INTRAMUSCULAR | Status: AC
  Administered 2019-01-29: 40 mg via INTRAVENOUS
  Filled 2019-01-29: qty 4

## 2019-01-29 NOTE — ED Triage Notes (Signed)
Pt BIB [facility] for swelling to his abdomen and testicles. This is a chronic problem that has been ongoing for about 8 months.

## 2019-01-29 NOTE — Discharge Instructions (Addendum)
Make sure to take your medications to help with your swelling.  It is very important for you to take all of your medications.  Follow-up with your primary care doctor to make sure that is improving.

## 2019-01-29 NOTE — ED Provider Notes (Signed)
[facility] EMERGENCY DEPARTMENT Provider Note   CSN: [id number] Arrival date & time: 01/29/19  0704    History   Chief Complaint Chief Complaint  Patient presents with  . Swollen Testicles    HPI ADAEL CULBREATH is a 58 y.o. male.     HPI Pt presents to the ED for scrotal swelling and pain.  Pt has a history of this.  He has been to the ED 102 times in the last 6 monhts.  He was last seen for the same complaint yesterday.  Pt states his testicles and thighs are swollen.  He denies any recent alcohol use.  Denies any fevers.  He has noticed some drainage.  No known fevers or chills. Past Medical History:  Diagnosis Date  . Chronic foot pain   . Cocaine abuse ([place])   . Diabetes mellitus without complication ([place])   . Hepatitis C    unsure   . Homelessness   . Hypertension   . Neuropathy   . Polysubstance abuse ([place])   . Schizophrenia ([place])   . Sleep apnea   . Systolic and diastolic CHF, chronic [facility])     Patient Active Problem List   Diagnosis Date Noted  . Combined systolic and diastolic heart failure ([place]) 01/19/2019  . Chest pain 01/16/2019  . 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  . Medically noncompliant   . Acute on chronic systolic CHF (congestive heart failure) ([place]) 01/02/2019  . Acute on chronic combined systolic (congestive) and diastolic (congestive) heart failure ([place]) 01/02/2019  . Acute on chronic systolic heart failure ([place]) 12/26/2018  . Mobitz type II atrioventricular block 12/25/2018  . Acute exacerbation of CHF (congestive heart failure) ([place]) 12/22/2018  . Acute on chronic respiratory failure with hypoxia ([place]) 12/22/2018  . CHF exacerbation ([place]) 12/19/2018  . Acute CHF (congestive heart failure) ([place]) 12/16/2018  . Diabetes mellitus without complication ([place]) 62/70/3500  . Acute on chronic systolic (congestive) heart failure ([place]) 12/11/2018  . Abdominal pain 12/11/2018  . Nausea vomiting and diarrhea 12/11/2018  . Microcytic anemia  12/11/2018  . Evaluation by psychiatric service required   . MDD (major depressive disorder), severe ([place]) 11/25/2018  . Pressure injury of skin 11/08/2018  . Elevated troponin 10/18/2018  . HLD (hyperlipidemia) 10/18/2018  . Anxiety 10/18/2018  . CHF (congestive heart failure), NYHA class II, acute on chronic, combined ([place]) 10/18/2018  . Hypertensive urgency 10/12/2018  . Mild renal insufficiency 10/12/2018  . Cellulitis 10/12/2018  . Penile cellulitis   . Adjustment disorder with mixed disturbance of emotions and conduct   . Sleep apnea 10/03/2018  . Hypokalemia 09/17/2018  . Scrotal swelling 09/17/2018  . Urinary hesitancy   . Constipation   . Acute on chronic combined systolic and diastolic CHF (congestive heart failure) ([place]) 08/25/2018  . Tobacco use 08/18/2018  . Homelessness 08/08/2018  . Smoker 08/08/2018  . Prostate enlargement 03/16/2018  . Aortic atherosclerosis ([place]) 03/16/2018  . 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 Chronic foot pain   . Schizoaffective disorder, bipolar type ([place]) 09/30/2016  . Substance induced mood disorder ([place]) 03/13/2015  . Schizophrenia, paranoid type ([place]) 01/17/2015  . Drug hallucinosis ([place]) 10/08/2014  . Chronic paranoid schizophrenia ([place]) 09/07/2014  . 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 Urinary retention   . Cocaine use disorder, severe, dependence ([place])   . Essential hypertension 03/28/2013  . Insulin-requiring or dependent type II diabetes mellitus ([place]) 03/15/2013  Past Surgical History:  Procedure Laterality Date  . MULTIPLE TOOTH EXTRACTIONS          Home Medications    Prior to Admission medications   Medication Sig Start Date End Date Taking? Authorizing Provider  acetaminophen (TYLENOL) 500 MG tablet Take 1 tablet (500 mg total) by mouth every 6 (six) hours as needed. Patient not taking: Reported on 01/19/2019 01/08/19   Fayrene Helperran,  [place], PA-C  ARIPiprazole (ABILIFY) 10 MG tablet Take 1 tablet (10 mg total) by mouth daily. Patient not taking: Reported on 01/13/2019 12/10/18   Joycelyn DasPokhrel, Laxman, MD  carbamazepine (TEGRETOL) 200 MG tablet Take 0.5 tablets (100 mg total) by mouth 2 (two) times daily. Patient not taking: Reported on 01/15/2019 11/26/18   Mancel BaleWentz, Elliott, MD  furosemide (LASIX) 40 MG tablet Take 1 tablet (40 mg total) by mouth 2 (two) times daily. 01/29/19 02/28/19  Linwood DibblesKnapp, Sriram Febles, MD  glipiZIDE (GLUCOTROL) 5 MG tablet Take 0.5 tablets (2.5 mg total) by mouth 2 (two) times daily before a meal. Patient not taking: Reported on 01/19/2019 01/01/19   Rai, Delene Ruffiniipudeep K, MD  Ipratropium-Albuterol (COMBIVENT) 20-100 MCG/ACT AERS respimat Inhale 1 puff into the lungs every 6 (six) hours. Patient not taking: Reported on 01/15/2019 11/30/18   Palumbo, April, MD  lisinopril (ZESTRIL) 10 MG tablet Take 1 tablet (10 mg total) by mouth daily. Patient not taking: Reported on 01/15/2019 11/26/18   Mancel BaleWentz, Elliott, MD  potassium chloride SA (K-DUR) 20 MEQ tablet Take 1 tablet (20 mEq total) by mouth 2 (two) times daily. Patient not taking: Reported on 01/15/2019 11/30/18   Nicanor AlconPalumbo, April, MD    Family History Family History  Problem Relation Age of Onset  . Hypertension Other   . Diabetes Other     Social History Social History   Tobacco Use  . Smoking status: Current Every Day Smoker    Packs/day: 1.00    Years: 20.00    Pack years: 20.00    Types: Cigarettes  . Smokeless tobacco: Current User  Substance Use Topics  . Alcohol use: Yes    Comment: Daily Drinker   . Drug use: Yes    Frequency: 7.0 times per week    Types: "Crack" cocaine, Cocaine, Marijuana    Comment: Crack     Allergies   Haldol [haloperidol]   Review of Systems Review of Systems  Respiratory: Negative for chest tightness and shortness of breath.   Cardiovascular: Negative for chest pain.  Genitourinary: Negative for dysuria.  All other systems reviewed  and are negative.    Physical Exam Updated Vital Signs BP (!) 156/94   Pulse (!) 104   Temp 98.1 F (36.7 C) (Oral)   Resp (!) 22   SpO2 92%   Physical Exam Vitals signs and nursing note reviewed.  Constitutional:      General: He is not in acute distress.    Appearance: He is well-developed.  HENT:     Head: Normocephalic and atraumatic.     Right Ear: External ear normal.     Left Ear: External ear normal.  Eyes:     General: No scleral icterus.       Right eye: No discharge.        Left eye: No discharge.     Conjunctiva/sclera: Conjunctivae normal.  Neck:     Musculoskeletal: Neck supple.     Trachea: No tracheal deviation.  Cardiovascular:     Rate and Rhythm: Normal rate and regular rhythm.  Pulmonary:  Effort: Pulmonary effort is normal. No respiratory distress.     Breath sounds: Normal breath sounds. No stridor. No wheezing or rales.  Abdominal:     General: Bowel sounds are normal. There is no distension.     Palpations: Abdomen is soft.     Tenderness: There is no abdominal tenderness. There is no guarding or rebound.  Genitourinary:    Comments: Edema of his scrotum and penis, tense edema in the scrotum, drainage in the intertriginous areas, no ulceration or signficant erythema noted, ?increased warmth Musculoskeletal:        General: Swelling present. No tenderness.  Skin:    General: Skin is warm and dry.     Findings: No rash.  Neurological:     Mental Status: He is alert.     Cranial Nerves: No cranial nerve deficit (no facial droop, extraocular movements intact, no slurred speech).     Sensory: No sensory deficit.     Motor: No abnormal muscle tone or seizure activity.     Coordination: Coordination normal.      ED Treatments / Results  Labs (all labs ordered are listed, but only abnormal results are displayed) Labs Reviewed  CBC - Abnormal; Notable for the following components:      Result Value   RBC 3.96 (*)    Hemoglobin 8.6 (*)     HCT 30.7 (*)    MCV 77.5 (*)    MCH 21.7 (*)    MCHC 28.0 (*)    RDW 21.1 (*)    nRBC 0.4 (*)    All other components within normal limits  COMPREHENSIVE METABOLIC PANEL - Abnormal; Notable for the following components:   Glucose, Bld 215 (*)    Calcium 8.6 (*)    Albumin 2.6 (*)    Alkaline Phosphatase 171 (*)    All other components within normal limits    EKG None  Radiology No results found.  Procedures Procedures (including critical care time)  Medications Ordered in ED Medications  furosemide (LASIX) injection 40 mg (has no administration in time range)  acetaminophen (TYLENOL) tablet 650 mg (650 mg Oral Given 01/29/19 0907)     Initial Impression / Assessment and Plan / ED Course  I have reviewed the triage vital signs and the nursing notes.  Pertinent labs & imaging results that were available during my care of the patient were reviewed by me and considered in my medical decision making (see chart for details).  Clinical Course as of Jan 29 947  Fri Jan 29, 2019  0815 Prior imaging tests earlier this month included us scrotum and ct pelvis.  Edema noted then, cellulitis vs peripheral edema.  Cxr on the 27th reviewed.   [JK]  [id number] No signs of AKI on CMET   [JK]    Clinical Course User Index [JK] Linwood DibblesKnapp, Christoher Drudge, MD     Patient presented to the ED with complaints of persistent scrotal and thigh swelling.  Patient has very tense edema on exam.  I was concerned about the possibility of infection versus his chronic edema.  Patient is afebrile and does not have a leukocytosis.  I suspect his edema is related to his known chronic issues related to this.  Patient has a history of substance abuse and medical noncompliance.  I suspect his persistent edema is related to that.  Patient indicates he has not been taking his Lasix.  Patient will be given a dose of Lasix.  I will give him a refill  of his medications.  I have stressed the importance of him taking his medications to  help with the swelling.  Patient is stable for discharge.  Final Clinical Impressions(s) / ED Diagnoses   Final diagnoses:  Peripheral edema    ED Discharge Orders         Ordered    furosemide (LASIX) 40 MG tablet  2 times daily     01/29/19 0944           Linwood DibblesKnapp, Dede Dobesh, MD 01/29/19 [phone]

## 2019-01-30 ENCOUNTER — Emergency Department (HOSPITAL_COMMUNITY): Payer: Medicare Other

## 2019-01-30 ENCOUNTER — Other Ambulatory Visit: Payer: Self-pay

## 2019-01-30 DIAGNOSIS — I110 Hypertensive heart disease with heart failure: Secondary | ICD-10-CM | POA: Diagnosis present

## 2019-01-30 DIAGNOSIS — E785 Hyperlipidemia, unspecified: Secondary | ICD-10-CM | POA: Diagnosis present

## 2019-01-30 DIAGNOSIS — Z8249 Family history of ischemic heart disease and other diseases of the circulatory system: Secondary | ICD-10-CM | POA: Diagnosis not present

## 2019-01-30 DIAGNOSIS — Z20828 Contact with and (suspected) exposure to other viral communicable diseases: Secondary | ICD-10-CM | POA: Diagnosis present

## 2019-01-30 DIAGNOSIS — F1290 Cannabis use, unspecified, uncomplicated: Secondary | ICD-10-CM | POA: Diagnosis present

## 2019-01-30 DIAGNOSIS — Z888 Allergy status to other drugs, medicaments and biological substances status: Secondary | ICD-10-CM | POA: Diagnosis not present

## 2019-01-30 DIAGNOSIS — E1140 Type 2 diabetes mellitus with diabetic neuropathy, unspecified: Secondary | ICD-10-CM | POA: Diagnosis present

## 2019-01-30 DIAGNOSIS — F17210 Nicotine dependence, cigarettes, uncomplicated: Secondary | ICD-10-CM | POA: Diagnosis present

## 2019-01-30 DIAGNOSIS — R45851 Suicidal ideations: Secondary | ICD-10-CM | POA: Diagnosis present

## 2019-01-30 DIAGNOSIS — I5043 Acute on chronic combined systolic (congestive) and diastolic (congestive) heart failure: Secondary | ICD-10-CM

## 2019-01-30 DIAGNOSIS — Z9119 Patient's noncompliance with other medical treatment and regimen: Secondary | ICD-10-CM | POA: Diagnosis not present

## 2019-01-30 DIAGNOSIS — Z9114 Patient's other noncompliance with medication regimen: Secondary | ICD-10-CM | POA: Diagnosis not present

## 2019-01-30 DIAGNOSIS — Z79899 Other long term (current) drug therapy: Secondary | ICD-10-CM | POA: Diagnosis not present

## 2019-01-30 DIAGNOSIS — Z590 Homelessness: Secondary | ICD-10-CM | POA: Diagnosis not present

## 2019-01-30 DIAGNOSIS — J9621 Acute and chronic respiratory failure with hypoxia: Secondary | ICD-10-CM | POA: Diagnosis present

## 2019-01-30 DIAGNOSIS — Z7984 Long term (current) use of oral hypoglycemic drugs: Secondary | ICD-10-CM | POA: Diagnosis not present

## 2019-01-30 DIAGNOSIS — F1010 Alcohol abuse, uncomplicated: Secondary | ICD-10-CM | POA: Diagnosis present

## 2019-01-30 DIAGNOSIS — I700 Atherosclerosis of aorta: Secondary | ICD-10-CM | POA: Diagnosis present

## 2019-01-30 DIAGNOSIS — E1165 Type 2 diabetes mellitus with hyperglycemia: Secondary | ICD-10-CM | POA: Diagnosis present

## 2019-01-30 DIAGNOSIS — G4733 Obstructive sleep apnea (adult) (pediatric): Secondary | ICD-10-CM | POA: Diagnosis present

## 2019-01-30 DIAGNOSIS — F1414 Cocaine abuse with cocaine-induced mood disorder: Secondary | ICD-10-CM | POA: Diagnosis present

## 2019-01-30 DIAGNOSIS — Z765 Malingerer [conscious simulation]: Secondary | ICD-10-CM | POA: Diagnosis not present

## 2019-01-30 DIAGNOSIS — F200 Paranoid schizophrenia: Secondary | ICD-10-CM | POA: Diagnosis present

## 2019-01-30 DIAGNOSIS — Z833 Family history of diabetes mellitus: Secondary | ICD-10-CM | POA: Diagnosis not present

## 2019-01-30 DIAGNOSIS — N5089 Other specified disorders of the male genital organs: Secondary | ICD-10-CM

## 2019-01-30 DIAGNOSIS — Z716 Tobacco abuse counseling: Secondary | ICD-10-CM | POA: Diagnosis not present

## 2019-01-30 LAB — GLUCOSE, CAPILLARY
Glucose-Capillary: 213 mg/dL — ABNORMAL HIGH (ref 70–99)
Glucose-Capillary: 167 mg/dL — ABNORMAL HIGH (ref 70–99)

## 2019-01-30 LAB — COMPREHENSIVE METABOLIC PANEL
ALT: 12 U/L (ref 0–44)
AST: 23 U/L (ref 15–41)
Albumin: 2.6 g/dL — ABNORMAL LOW (ref 3.5–5.0)
Alkaline Phosphatase: 173 U/L — ABNORMAL HIGH (ref 38–126)
Anion gap: 9 (ref 5–15)
BUN: 13 mg/dL (ref 6–20)
CO2: 25 mmol/L (ref 22–32)
Calcium: 8.6 mg/dL — ABNORMAL LOW (ref 8.9–10.3)
Chloride: 105 mmol/L (ref 98–111)
Creatinine, Ser: 0.98 mg/dL (ref 0.61–1.24)
GFR calc Af Amer: >60 mL/min (ref >60)
GFR calc non Af Amer: >60 mL/min (ref >60)
Glucose, Bld: 197 mg/dL — ABNORMAL HIGH (ref 70–99)
Potassium: 3.5 mmol/L (ref 3.5–5.1)
Sodium: 139 mmol/L (ref 135–145)
Total Bilirubin: 0.8 mg/dL (ref 0.3–1.2)
Total Protein: 7.1 g/dL (ref 6.5–8.1)

## 2019-01-30 LAB — CBG MONITORING, ED: Glucose-Capillary: 164 mg/dL — ABNORMAL HIGH (ref 70–99)

## 2019-01-30 LAB — URINALYSIS, ROUTINE W REFLEX MICROSCOPIC
Bilirubin Urine: NEGATIVE
Glucose, UA: NEGATIVE mg/dL
Hgb urine dipstick: NEGATIVE
Ketones, ur: NEGATIVE mg/dL
Leukocytes,Ua: NEGATIVE
Nitrite: NEGATIVE
Protein, ur: 100 mg/dL — AB
Specific Gravity, Urine: 1.019 (ref 1.005–1.030)
pH: 5 (ref 5.0–8.0)

## 2019-01-30 LAB — CBC
HCT: 30 % — ABNORMAL LOW (ref 39.0–52.0)
Hemoglobin: 8.4 g/dL — ABNORMAL LOW (ref 13.0–17.0)
MCH: 21.4 pg — ABNORMAL LOW (ref 26.0–34.0)
MCHC: 28 g/dL — ABNORMAL LOW (ref 30.0–36.0)
MCV: 76.3 fL — ABNORMAL LOW (ref 80.0–100.0)
Platelets: 294 10*3/uL (ref 150–400)
RBC: 3.93 MIL/uL — ABNORMAL LOW (ref 4.22–5.81)
RDW: 21.2 % — ABNORMAL HIGH (ref 11.5–15.5)
WBC: 8.1 10*3/uL (ref 4.0–10.5)
nRBC: 0.5 % — ABNORMAL HIGH (ref 0.0–0.2)

## 2019-01-30 LAB — RAPID URINE DRUG SCREEN, HOSP PERFORMED
Amphetamines: NOT DETECTED
Barbiturates: NOT DETECTED
Benzodiazepines: NOT DETECTED
Cocaine: POSITIVE — AB
Opiates: NOT DETECTED
Tetrahydrocannabinol: NOT DETECTED

## 2019-01-30 LAB — SARS CORONAVIRUS 2 (TAT 6-24 HRS): SARS Coronavirus 2: NEGATIVE

## 2019-01-30 LAB — TROPONIN I (HIGH SENSITIVITY)
Troponin I (High Sensitivity): 20 ng/L — ABNORMAL HIGH (ref <18)
Troponin I (High Sensitivity): 23 ng/L — ABNORMAL HIGH (ref <18)

## 2019-01-30 LAB — ACETAMINOPHEN LEVEL: Acetaminophen (Tylenol), Serum: <10 ug/mL — ABNORMAL LOW (ref 10–30)

## 2019-01-30 LAB — ETHANOL: Alcohol, Ethyl (B): <10 mg/dL (ref <10)

## 2019-01-30 LAB — SALICYLATE LEVEL: Salicylate Lvl: <7 mg/dL (ref 2.8–30.0)

## 2019-01-30 LAB — BRAIN NATRIURETIC PEPTIDE: B Natriuretic Peptide: 1357.3 pg/mL — ABNORMAL HIGH (ref 0.0–100.0)

## 2019-01-30 MED ORDER — CARBAMAZEPINE 200 MG PO TABS
100.0000 mg | ORAL_TABLET | Freq: Two times a day (BID) | ORAL | Status: DC
  Administered 2019-01-30 – 2019-01-31 (×2): 100 mg via ORAL
  Filled 2019-01-30 (×3): qty 0.5

## 2019-01-30 MED ORDER — ENOXAPARIN SODIUM 40 MG/0.4ML ~~LOC~~ SOLN
40.0000 mg | Freq: Every day | SUBCUTANEOUS | Status: DC
  Administered 2019-01-30 – 2019-01-31 (×2): 40 mg via SUBCUTANEOUS
  Filled 2019-01-30 (×3): qty 0.4

## 2019-01-30 MED ORDER — ACETAMINOPHEN 325 MG PO TABS
650.0000 mg | ORAL_TABLET | Freq: Four times a day (QID) | ORAL | Status: DC | PRN
  Administered 2019-01-30 – 2019-01-31 (×4): 650 mg via ORAL
  Filled 2019-01-30 (×4): qty 2

## 2019-01-30 MED ORDER — THIAMINE HCL 100 MG/ML IJ SOLN: 100.0000 mg | Freq: Every day | INTRAMUSCULAR | Status: DC

## 2019-01-30 MED ORDER — HYDRALAZINE HCL 20 MG/ML IJ SOLN: 5.0000 mg | INTRAMUSCULAR | Status: DC | PRN

## 2019-01-30 MED ORDER — FUROSEMIDE 10 MG/ML IJ SOLN
40.0000 mg | Freq: Once | INTRAMUSCULAR | Status: AC
  Administered 2019-01-30: 40 mg via INTRAVENOUS
  Filled 2019-01-30: qty 4

## 2019-01-30 MED ORDER — LORAZEPAM 2 MG/ML IJ SOLN: 1.0000 mg | Freq: Four times a day (QID) | INTRAMUSCULAR | Status: DC | PRN

## 2019-01-30 MED ORDER — ADULT MULTIVITAMIN W/MINERALS CH
1.0000 | ORAL_TABLET | Freq: Every day | ORAL | Status: DC
  Administered 2019-01-30 – 2019-01-31 (×2): 1 via ORAL
  Filled 2019-01-30 (×2): qty 1

## 2019-01-30 MED ORDER — FUROSEMIDE 10 MG/ML IJ SOLN
40.0000 mg | Freq: Two times a day (BID) | INTRAMUSCULAR | Status: DC
  Administered 2019-01-30: 40 mg via INTRAVENOUS
  Filled 2019-01-30: qty 4

## 2019-01-30 MED ORDER — INSULIN ASPART 100 UNIT/ML ~~LOC~~ SOLN
0.0000 [IU] | Freq: Three times a day (TID) | SUBCUTANEOUS | Status: DC
  Administered 2019-01-30 – 2019-01-31 (×2): 3 [IU] via SUBCUTANEOUS
  Administered 2019-01-31 (×2): 2 [IU] via SUBCUTANEOUS

## 2019-01-30 MED ORDER — NICOTINE 21 MG/24HR TD PT24
21.0000 mg | MEDICATED_PATCH | Freq: Every day | TRANSDERMAL | Status: DC
  Administered 2019-01-30 – 2019-01-31 (×2): 21 mg via TRANSDERMAL
  Filled 2019-01-30 (×3): qty 1

## 2019-01-30 MED ORDER — ACETAMINOPHEN 500 MG PO TABS
1000.0000 mg | ORAL_TABLET | Freq: Once | ORAL | Status: AC
  Administered 2019-01-30: 1000 mg via ORAL
  Filled 2019-01-30: qty 2

## 2019-01-30 MED ORDER — FOLIC ACID 1 MG PO TABS
1.0000 mg | ORAL_TABLET | Freq: Every day | ORAL | Status: DC
  Administered 2019-01-30 – 2019-01-31 (×2): 1 mg via ORAL
  Filled 2019-01-30 (×2): qty 1

## 2019-01-30 MED ORDER — ACETAMINOPHEN 650 MG RE SUPP: 650.0000 mg | Freq: Four times a day (QID) | RECTAL | Status: DC | PRN

## 2019-01-30 MED ORDER — FUROSEMIDE 10 MG/ML IJ SOLN
40.0000 mg | Freq: Three times a day (TID) | INTRAMUSCULAR | Status: DC
  Administered 2019-01-30 – 2019-01-31 (×4): 40 mg via INTRAVENOUS
  Filled 2019-01-30 (×4): qty 4

## 2019-01-30 MED ORDER — INSULIN ASPART 100 UNIT/ML ~~LOC~~ SOLN: 0.0000 [IU] | Freq: Every day | SUBCUTANEOUS | Status: DC

## 2019-01-30 MED ORDER — LORAZEPAM 1 MG PO TABS
1.0000 mg | ORAL_TABLET | Freq: Four times a day (QID) | ORAL | Status: DC | PRN
  Administered 2019-01-30: 14:00:00 1 mg via ORAL
  Filled 2019-01-30: qty 1

## 2019-01-30 MED ORDER — ARIPIPRAZOLE 5 MG PO TABS
10.0000 mg | ORAL_TABLET | Freq: Every day | ORAL | Status: DC
  Administered 2019-01-30 – 2019-01-31 (×2): 10 mg via ORAL
  Filled 2019-01-30 (×2): qty 2

## 2019-01-30 MED ORDER — VITAMIN B-1 100 MG PO TABS
100.0000 mg | ORAL_TABLET | Freq: Every day | ORAL | Status: DC
  Administered 2019-01-30 – 2019-01-31 (×2): 100 mg via ORAL
  Filled 2019-01-30 (×2): qty 1

## 2019-01-30 NOTE — BH Assessment (Signed)
Received TTS consult. Spoke to RN who said Pt is currently somnolent and unable to participate in assessment. [facility] will contact TTS when Pt is alert and able to participate.   Evelena Peat, [facility], [name], [facility] Triage Specialist [phone]

## 2019-01-30 NOTE — ED Notes (Signed)
Meal tray delivered.

## 2019-01-30 NOTE — H&P (Signed)
History and Physical    Taylor MemosFrederick L Wendorff WJX:[id number] DOB: 03-12-1961 DOA: 01/29/2019  PCP: Patient, No Pcp Per  Chief Complaint: Scrotal and bilateral lower extremity edema  HPI: [name] is a 58 y.o. male with medical history significant of chronic combined systolic and diastolic congestive heart failure with EF 25 to 30%, insulin-dependent type 2 diabetes mellitus, schizophrenia, homelessness, sleep apnea, cocaine abuse, noncompliance to medications with frequent admissions, often leaving AMA, now presenting to the hospital for evaluation of scrotal and bilateral lower extremity edema.  Patient reports having edema in his legs and scrotum for the past 1 year.  States this has been an ongoing problem and worse now.  Reports having shortness of breath.  States he coughs only when smoking cigarettes.  States he is not able to take any medications as [place] on the streets.  States the only time he gets medications is when he is admitted to the hospital.  He is requesting to be admitted.  States he wants to hurt himself because he lives on the streets and is tired of the life that he is living.  He was agitated and no additional history could be obtained from him.  ED Course: Tachycardic and tachypneic.  Systolic in the 160s.  SPO2 88% on room air and placed on 2 L supplemental oxygen.  No leukocytosis.  UDS pending.  Blood ethanol level negative.  Salicylate level negative.  Acetaminophen level negative.  UA pending.  BNP 1357, chronically elevated.  High-sensitivity troponin pending.  COVID-19 rapid test pending.  Chest x-ray showing cardiomegaly with mild volume overload.  Patient received IV Lasix 40 mg and Tylenol in the ED.  Review of Systems:  All systems reviewed and apart from history of presenting illness, are negative.  Past Medical History:  Diagnosis Date  . Chronic foot pain   . Cocaine abuse (HCC)   . Diabetes mellitus without complication (HCC)   . Hepatitis C    unsure   . Homelessness   . Hypertension   . Neuropathy   . Polysubstance abuse (HCC)   . Schizophrenia (HCC)   . Sleep apnea   . Systolic and diastolic CHF, chronic (HCC)     Past Surgical History:  Procedure Laterality Date  . MULTIPLE TOOTH EXTRACTIONS       reports that he has been smoking cigarettes. He has a 20.00 pack-year smoking history. He uses smokeless tobacco. He reports current alcohol use. He reports current drug use. Frequency: 7.00 times per week. Drugs: "Crack" cocaine, Cocaine, and Marijuana.  Allergies  Allergen Reactions  . Haldol [Haloperidol] Other (See Comments)    Muscle spasms, loss of voluntary movement. However, pt has taken Thorazine on multiple occasions with no adverse effects.     Family History  Problem Relation Age of Onset  . Hypertension Other   . Diabetes Other     Prior to Admission medications   Medication Sig Start Date End Date Taking? Authorizing Provider  acetaminophen (TYLENOL) 500 MG tablet Take 1 tablet (500 mg total) by mouth every 6 (six) hours as needed. Patient not taking: Reported on 01/19/2019 01/08/19   Fayrene Helperran, [place], PA-C  ARIPiprazole (ABILIFY) 10 MG tablet Take 1 tablet (10 mg total) by mouth daily. Patient not taking: Reported on 01/13/2019 12/10/18   Joycelyn DasPokhrel, Laxman, MD  carbamazepine (TEGRETOL) 200 MG tablet Take 0.5 tablets (100 mg total) by mouth 2 (two) times daily. Patient not taking: Reported on 01/15/2019 11/26/18   Mancel BaleWentz, Elliott, MD  furosemide (LASIX) 40 MG tablet Take 1 tablet (40 mg total) by mouth 2 (two) times daily. 01/29/19 02/28/19  Linwood DibblesKnapp, Jon, MD  glipiZIDE (GLUCOTROL) 5 MG tablet Take 0.5 tablets (2.5 mg total) by mouth 2 (two) times daily before a meal. Patient not taking: Reported on 01/19/2019 01/01/19   Rai, Delene Ruffiniipudeep K, MD  Ipratropium-Albuterol (COMBIVENT) 20-100 MCG/ACT AERS respimat Inhale 1 puff into the lungs every 6 (six) hours. Patient not taking: Reported on 01/15/2019 11/30/18   Palumbo, April, MD   lisinopril (ZESTRIL) 10 MG tablet Take 1 tablet (10 mg total) by mouth daily. Patient not taking: Reported on 01/15/2019 11/26/18   Mancel BaleWentz, Elliott, MD  potassium chloride SA (K-DUR) 20 MEQ tablet Take 1 tablet (20 mEq total) by mouth 2 (two) times daily. Patient not taking: Reported on 01/15/2019 11/30/18   Cy BlamerPalumbo, April, MD    Physical Exam: Vitals:   01/30/19 0515 01/30/19 0530 01/30/19 0545 01/30/19 0745  BP:    (!) 159/82  Pulse: 100 99 99 98  Resp: 19 (!) 24 (!) 21 17  Temp:      TempSrc:      SpO2: 97% 96% 98% 100%    Physical Exam  Constitutional: He is oriented to person, place, and time. He appears well-developed and well-nourished. No distress.  HENT:  Head: Normocephalic.  Eyes: Right eye exhibits no discharge. Left eye exhibits no discharge.  Neck: Neck supple.  Cardiovascular: Normal rate, regular rhythm and intact distal pulses.  Pulmonary/Chest: Effort normal. He has no wheezes. He has rales.  Bibasilar rales  Abdominal: Soft. Bowel sounds are normal. He exhibits distension. There is no abdominal tenderness. There is no rebound and no guarding.  Musculoskeletal:        General: Edema present.     Comments: Edema of bilateral lower extremities extending up to the genitals and abdomen.  Scrotum and penis appear swollen.  Neurological: He is alert and oriented to person, place, and time.  Skin: Skin is warm and dry. He is not diaphoretic.  Psychiatric:  Agitated     Labs on Admission: I have personally reviewed following labs and imaging studies  CBC: Recent Labs  Lab 01/25/19 1122 01/26/19 1117 01/29/19 0853 01/29/19 2313  WBC 7.7 7.6 8.3 8.1  NEUTROABS 6.9  --   --   --   HGB 9.0* 8.3* 8.6* 8.4*  HCT 33.0* 28.8* 30.7* 30.0*  MCV 80.5 75.4* 77.5* 76.3*  PLT 355 343 294 294   Basic Metabolic Panel: Recent Labs  Lab 01/25/19 1122 01/26/19 1117 01/29/19 0853 01/29/19 2313  NA 138 140 138 139  K 4.5 3.9 3.5 3.5  CL 104 106 104 105  CO2 22 24 26 25    GLUCOSE 195* 181* 215* 197*  BUN 19 18 13 13   CREATININE 1.10 1.04 0.99 0.98  CALCIUM 8.7* 8.6* 8.6* 8.6*   GFR: Estimated Creatinine Clearance: 98.9 mL/min (by C-G formula based on SCr of 0.98 mg/dL). Liver Function Tests: Recent Labs  Lab 01/25/19 1122 01/29/19 0853 01/29/19 2313  AST 24 22 23   ALT 12 11 12   ALKPHOS 155* 171* 173*  BILITOT 0.9 0.8 0.8  PROT 6.9 6.8 7.1  ALBUMIN 2.7* 2.6* 2.6*   No results for input(s): LIPASE, AMYLASE in the last 168 hours. No results for input(s): AMMONIA in the last 168 hours. Coagulation Profile: No results for input(s): INR, PROTIME in the last 168 hours. Cardiac Enzymes: No results for input(s): CKTOTAL, CKMB, CKMBINDEX, TROPONINI in the last 168  hours. BNP (last 3 results) No results for input(s): PROBNP in the last 8760 hours. HbA1C: No results for input(s): HGBA1C in the last 72 hours. CBG: Recent Labs  Lab 01/24/19 1143  GLUCAP 164*   Lipid Profile: No results for input(s): CHOL, HDL, LDLCALC, TRIG, CHOLHDL, LDLDIRECT in the last 72 hours. Thyroid Function Tests: No results for input(s): TSH, T4TOTAL, FREET4, T3FREE, THYROIDAB in the last 72 hours. Anemia Panel: No results for input(s): VITAMINB12, FOLATE, FERRITIN, TIBC, IRON, RETICCTPCT in the last 72 hours. Urine analysis:    Component Value Date/Time   COLORURINE YELLOW 01/30/2019 0320   APPEARANCEUR CLEAR 01/30/2019 0320   LABSPEC 1.019 01/30/2019 0320   PHURINE 5.0 01/30/2019 0320   GLUCOSEU NEGATIVE 01/30/2019 0320   HGBUR NEGATIVE 01/30/2019 0320   BILIRUBINUR NEGATIVE 01/30/2019 0320   KETONESUR NEGATIVE 01/30/2019 0320   PROTEINUR 100 (A) 01/30/2019 0320   UROBILINOGEN 1.0 03/14/2015 0610   NITRITE NEGATIVE 01/30/2019 0320   LEUKOCYTESUR NEGATIVE 01/30/2019 0320    Radiological Exams on Admission: Dg Chest Portable 1 View  Result Date: 01/30/2019 CLINICAL DATA:  Shortness of breath EXAM: PORTABLE CHEST 1 VIEW COMPARISON:  January 25, 2019 FINDINGS: There  is cardiomegaly with mild volume overload. There is chronic atelectasis versus scarring in the lingula. There may be small bilateral pleural effusions. There is no pneumothorax. Advanced degenerative changes are noted of the right shoulder. IMPRESSION: Cardiomegaly with mild volume overload. Electronically Signed   By: Katherine Mantlehristopher  Green M.D.   On: 01/30/2019 01:37    EKG: Independently reviewed.  Sinus tachycardia (heart rate 107), T wave abnormality in lateral leads similar to prior tracing.  Assessment/Plan Principal Problem:   Acute exacerbation of CHF (congestive heart failure) (HCC) Active Problems:   Suicidal ideation   Homelessness   Acute respiratory failure with hypoxia (HCC)   Scrotal edema   Acute on chronic combined systolic and diastolic congestive heart failure BNP 1357.  Chest x-ray showing cardiomegaly with mild volume overload.  Last echo done in May 2020 with LVEF 25 to 30%. -Cardiac monitoring -IV Lasix 40 mg twice daily -Monitor renal function, intake and output, daily weights -Low-sodium diet with fluid restriction  Acute hypoxic respiratory failure secondary to acutely decompensated CHF PO2 88% on room air and currently requiring 2 L supplemental oxygen. -Diuresis as above -Continue supplemental oxygen; wean as tolerated  Scrotal and peripheral edema secondary to acutely decompensated CHF Patient has significant edema extending from his bilateral lower extremities up to the abdomen.  His scrotal and peripheral edema has been treated on multiple admissions but he has been noncompliant with treatment and often leaves AMA. -Diuresis as above  Suicidal ideation -Psych consult -Suicide precautions, sitter at bedside  Homelessness -Social work consult  Hypertension Blood pressure elevated secondary to medication noncompliance. -Diuresis as above -Hydralazine PRN  Alcohol abuse -CIWA protocol; Ativan PRN -Thiamine, folate, multivitamin  Tobacco use  -NicoDerm patch -Counseled to quit  OSA -CPAP at night  Uncontrolled insulin-dependent type 2 diabetes Diabetes uncontrolled secondary to medication noncompliance.  Last A1c 9.7 on 6/5.  -Sliding scale insulin and CBG checks  DVT prophylaxis: Lovenox Code Status: Full code Family Communication: No family available at this time Disposition Plan: Anticipate discharge after clinical improvement. Consults called: None Admission status: It is my clinical opinion that admission to INPATIENT is reasonable and necessary in this 58 y.o. male . presenting with acute hypoxic respiratory failure, scrotal and peripheral edema secondary to acutely decompensated CHF.  Anticipate will require IV diuresis  for several days.  Given the aforementioned, the predictability of an adverse outcome is felt to be significant. I expect that the patient will require at least 2 midnights in the hospital to treat this condition.   The medical decision making on this patient was of high complexity and the patient is at high risk for clinical deterioration, therefore this is a level 3 visit.  Shela Leff MD [facility] Pager [phone]  If 7PM-7AM, please contact night-coverage [email or web address] Password [facility]  01/30/2019, 8:46 AM

## 2019-01-30 NOTE — Progress Notes (Signed)
[name] is a 58 y.o. male with medical history significant of chronic combined systolic and diastolic congestive heart failure with EF 25 to 30%, insulin-dependent type 2 diabetes mellitus, schizophrenia, homelessness, sleep apnea, cocaine abuse, noncompliance to medications with frequent admissions, often leaving AMA, now presenting to the hospital for evaluation of scrotal and bilateral lower extremity edema.  Patient reports having edema in his legs and scrotum for the past 1 year.  States this has been an ongoing problem and worse now.  Reports having shortness of breath.  States he coughs only when smoking cigarettes.  BNP elevated greater than 1300 compared to 866 on 01/25/2019. Chest x-ray showing cardiomegaly with increase in pulmonary vascularity suggestive of pulmonary edema.    Admitted for acute on chronic combined systolic and diastolic congestive heart failure.   Ongoing diuresing.  Closely monitor vital signs, urine output, and renal function.  01/30/19: Patient seen and examined at his bedside in the ED.  Noncooperative with interview and physical exam.  States he only coughs when he smokes cigarettes.  Would not answer questions when asked if he was having chest pain.  Admits to scrotal tenderness present for a year but getting worse.  Please refer to H&P dictated by Dr. Marlowe Sax on 01/30/2019 for further details of the assessment and plan.

## 2019-01-30 NOTE — ED Notes (Signed)
ED TO INPATIENT HANDOFF REPORT  ED Nurse Name and Phone #: Susa RaringLisa, RN [phone]  S Name/Age/Gender [name] 58 y.o. male Room/Bed: 047C/047C  Code Status   Code Status: Full Code  Home/SNF/Other Home Patient oriented to: self, place, time and situation Is this baseline? Yes   Triage Complete: Triage complete  Chief Complaint Edema  Triage Note No notes on file   Allergies Allergies  Allergen Reactions  . Haldol [Haloperidol] Other (See Comments)    Muscle spasms, loss of voluntary movement. However, pt has taken Thorazine on multiple occasions with no adverse effects.     Level of Care/Admitting Diagnosis ED Disposition    ED Disposition Condition Comment   Admit  [facility] [[id number]]  Level of Care: Telemetry Cardiac [103]  Covid Evaluation: Person Under Investigation (PUI)  Diagnosis: Acute exacerbation of CHF (congestive heart failure) [facility](HCC) [[id number]][[id number]]  Admitting Physician: John GiovanniATHORE, VASUNDHRA [[id number]]  Attending Physician: John GiovanniATHORE, VASUNDHRA [[id number]]  Estimated length of stay: past midnight tomorrow  Certification:: I certify this patient will need inpatient services for at least 2 midnights  PT Class (Do Not Modify): Inpatient [101]  PT Acc Code (Do Not Modify): Private [1]       B Medical/Surgery History Past Medical History:  Diagnosis Date  . Chronic foot pain   . Cocaine abuse (HCC)   . Diabetes mellitus without complication (HCC)   . Hepatitis C    unsure   . Homelessness   . Hypertension   . Neuropathy   . Polysubstance abuse (HCC)   . Schizophrenia (HCC)   . Sleep apnea   . Systolic and diastolic CHF, chronic (HCC)    Past Surgical History:  Procedure Laterality Date  . MULTIPLE TOOTH EXTRACTIONS       A IV Location/Drains/Wounds Patient Lines/Drains/Airways Status   Active Line/Drains/Airways    None          Intake/Output Last 24 hours  Intake/Output Summary (Last 24 hours) at  01/30/2019 1418 Last data filed at 01/30/2019 1336 Gross per 24 hour  Intake 490 ml  Output 2225 ml  Net -1735 ml    Labs/Imaging Results for orders placed or performed during the hospital encounter of 01/29/19 (from the past 48 hour(s))  Comprehensive metabolic panel     Status: Abnormal   Collection Time: 01/29/19 11:13 PM  Result Value Ref Range   Sodium 139 135 - 145 mmol/L   Potassium 3.5 3.5 - 5.1 mmol/L   Chloride 105 98 - 111 mmol/L   CO2 25 22 - 32 mmol/L   Glucose, Bld 197 (H) 70 - 99 mg/dL   BUN 13 6 - 20 mg/dL   Creatinine, Ser 2.440.98 0.61 - 1.24 mg/dL   Calcium 8.6 (L) 8.9 - 10.3 mg/dL   Total Protein 7.1 6.5 - 8.1 g/dL   Albumin 2.6 (L) 3.5 - 5.0 g/dL   AST 23 15 - 41 U/L   ALT 12 0 - 44 U/L   Alkaline Phosphatase 173 (H) 38 - 126 U/L   Total Bilirubin 0.8 0.3 - 1.2 mg/dL   GFR calc non Af Amer >60 >60 mL/min   GFR calc Af Amer >60 >60 mL/min   Anion gap 9 5 - 15    Comment: Performed at [facility], [address] [place], [place] [address]  Ethanol     Status: None   Collection Time: 01/29/19 11:13 PM  Result Value Ref Range   Alcohol, Ethyl (B) <  10 <10 mg/dL    Comment: (NOTE) Lowest detectable limit for serum alcohol is 10 mg/dL. For medical purposes only. Performed at [facility], [address] [place], [place] [address]   Salicylate level     Status: None   Collection Time: 01/29/19 11:13 PM  Result Value Ref Range   Salicylate Lvl <7.0 2.8 - 30.0 mg/dL    Comment: Performed at [facility], [address] [place], [place] [address]  Acetaminophen level     Status: Abnormal   Collection Time: 01/29/19 11:13 PM  Result Value Ref Range   Acetaminophen (Tylenol), Serum <10 (L) 10 - 30 ug/mL    Comment: (NOTE) Therapeutic concentrations vary significantly. A range of 10-30 ug/mL  may be an effective concentration for many patients. However, some  are best treated at concentrations outside of this range. Acetaminophen  concentrations >150 ug/mL at 4 hours after ingestion  and >50 ug/mL at 12 hours after ingestion are often associated with  toxic reactions. Performed at [name], [address] [place], [place] [address]   cbc     Status: Abnormal   Collection Time: 01/29/19 11:13 PM  Result Value Ref Range   WBC 8.1 4.0 - 10.5 K/uL   RBC 3.93 (L) 4.22 - 5.81 MIL/uL   Hemoglobin 8.4 (L) 13.0 - 17.0 g/dL    Comment: Reticulocyte Hemoglobin testing may be clinically indicated, consider ordering this additional test [id number]    HCT 30.0 (L) 39.0 - 52.0 %   MCV 76.3 (L) 80.0 - 100.0 fL   MCH 21.4 (L) 26.0 - 34.0 pg   MCHC 28.0 (L) 30.0 - 36.0 g/dL   RDW 62.121.2 (H) 30.811.5 - 65.715.5 %   Platelets 294 150 - 400 K/uL   nRBC 0.5 (H) 0.0 - 0.2 %    Comment: Performed at [facility], [address] [place], [place] [address]  Brain natriuretic peptide     Status: Abnormal   Collection Time: 01/30/19  1:31 AM  Result Value Ref Range   B Natriuretic Peptide 1,357.3 (H) 0.0 - 100.0 pg/mL    Comment: Performed at [facility], [address] [place], [place] [address]  Troponin I (High Sensitivity)     Status: Abnormal   Collection Time: 01/30/19  1:31 AM  Result Value Ref Range   Troponin I (High Sensitivity) 20 (H) <18 ng/L    Comment: (NOTE) Elevated high sensitivity troponin I (hsTnI) values and significant  changes across serial measurements may suggest ACS but many other  chronic and acute conditions are known to elevate hsTnI results.  Refer to the "Links" section for chest pain algorithms and additional  guidance. Performed at [facility], [address] [place], [place] [address]   SARS CORONAVIRUS 2 Nasal Swab Aptima Multi Swab     Status: None   Collection Time: 01/30/19  2:04 AM   Specimen: Aptima Multi Swab; Nasal Swab  Result Value Ref Range   SARS Coronavirus 2 NEGATIVE NEGATIVE    Comment: (NOTE) SARS-CoV-2 target nucleic acids are NOT DETECTED. The  SARS-CoV-2 RNA is generally detectable in upper and lower respiratory specimens during the acute phase of infection. Negative results do not preclude SARS-CoV-2 infection, do not rule out co-infections with other pathogens, and should not be used as the sole basis for treatment or other patient management decisions. Negative results must be combined with clinical observations, patient history, and epidemiological information. The  expected result is Negative. Fact Sheet for Patients: HairSlick.nohttps://www.fda.gov/media/138098/download Fact Sheet for Healthcare Providers: quierodirigir.comhttps://www.fda.gov/media/138095/download This test is not yet approved or cleared by the [place] FDA and  has been authorized for detection and/or diagnosis of SARS-CoV-2 by FDA under an Emergency Use Authorization (EUA). This EUA will remain  in effect (meaning this test can be used) for the duration of the COVID-19 declaration under Section 56 4(b)(1) of the Act, 21 U.S.C. section 360bbb-3(b)(1), unless the authorization is terminated or revoked sooner. Performed at [facility], [address] [place], [place] [address]   Troponin I (High Sensitivity)     Status: Abnormal   Collection Time: 01/30/19  3:17 AM  Result Value Ref Range   Troponin I (High Sensitivity) 23 (H) <18 ng/L    Comment: (NOTE) Elevated high sensitivity troponin I (hsTnI) values and significant  changes across serial measurements may suggest ACS but many other  chronic and acute conditions are known to elevate hsTnI results.  Refer to the "Links" section for chest pain algorithms and additional  guidance. Performed at [facility], [address] [place], [place] [address]   Rapid urine drug screen (hospital performed)     Status: Abnormal   Collection Time: 01/30/19  3:20 AM  Result Value Ref Range   Opiates NONE DETECTED NONE DETECTED   Cocaine POSITIVE (A) NONE DETECTED   Benzodiazepines NONE DETECTED NONE DETECTED    Amphetamines NONE DETECTED NONE DETECTED   Tetrahydrocannabinol NONE DETECTED NONE DETECTED   Barbiturates NONE DETECTED NONE DETECTED    Comment: (NOTE) DRUG SCREEN FOR MEDICAL PURPOSES ONLY.  IF CONFIRMATION IS NEEDED FOR ANY PURPOSE, NOTIFY LAB WITHIN 5 DAYS. LOWEST DETECTABLE LIMITS FOR URINE DRUG SCREEN Drug Class                     Cutoff (ng/mL) Amphetamine and metabolites    1000 Barbiturate and metabolites    200 Benzodiazepine                 200 Tricyclics and metabolites     300 Opiates and metabolites        300 Cocaine and metabolites        300 THC                            50 Performed at [facility], [address] [place], [place] [address]   Urinalysis, Routine w reflex microscopic     Status: Abnormal   Collection Time: 01/30/19  3:20 AM  Result Value Ref Range   Color, Urine YELLOW YELLOW   APPearance CLEAR CLEAR   Specific Gravity, Urine 1.019 1.005 - 1.030   pH 5.0 5.0 - 8.0   Glucose, UA NEGATIVE NEGATIVE mg/dL   Hgb urine dipstick NEGATIVE NEGATIVE   Bilirubin Urine NEGATIVE NEGATIVE   Ketones, ur NEGATIVE NEGATIVE mg/dL   Protein, ur 213100 (A) NEGATIVE mg/dL   Nitrite NEGATIVE NEGATIVE   Leukocytes,Ua NEGATIVE NEGATIVE   RBC / HPF 0-5 0 - 5 RBC/hpf   WBC, UA 0-5 0 - 5 WBC/hpf   Bacteria, UA RARE (A) NONE SEEN   Squamous Epithelial / LPF 0-5 0 - 5   Mucus PRESENT    Hyaline Casts, UA PRESENT     Comment: Performed at [facility], [address] [place], [place] [address]  CBG monitoring, ED     Status: Abnormal  Collection Time: 01/30/19  1:32 PM  Result Value Ref Range   Glucose-Capillary 164 (H) 70 - 99 mg/dL   Comment 1 Notify RN    Comment 2 Document in Chart    Dg Chest Portable 1 View  Result Date: 01/30/2019 CLINICAL DATA:  Shortness of breath EXAM: PORTABLE CHEST 1 VIEW COMPARISON:  January 25, 2019 FINDINGS: There is cardiomegaly with mild volume overload. There is chronic atelectasis versus scarring in the lingula.  There may be small bilateral pleural effusions. There is no pneumothorax. Advanced degenerative changes are noted of the right shoulder. IMPRESSION: Cardiomegaly with mild volume overload. Electronically Signed   By: Constance Holster M.D.   On: 01/30/2019 01:37    Pending Labs Unresulted Labs (From admission, onward)    Start     Ordered   01/31/19 8588  Basic metabolic panel  Tomorrow morning,   R     01/30/19 0836          Vitals/Pain Today's Vitals   01/30/19 1200 01/30/19 1215 01/30/19 1230 01/30/19 1245  BP: (!) 184/105 (!) 168/106 (!) 171/102 (!) 177/115  Pulse:      Resp: (!) 23 20 14 20   Temp:      TempSrc:      SpO2:      PainSc:        Isolation Precautions No active isolations  Medications Medications  enoxaparin (LOVENOX) injection 40 mg (has no administration in time range)  acetaminophen (TYLENOL) tablet 650 mg (650 mg Oral Given 01/30/19 1343)    Or  acetaminophen (TYLENOL) suppository 650 mg ( Rectal See Alternative 01/30/19 1343)  furosemide (LASIX) injection 40 mg (40 mg Intravenous Given 01/30/19 0740)  LORazepam (ATIVAN) tablet 1 mg (1 mg Oral Given 01/30/19 1343)    Or  LORazepam (ATIVAN) injection 1 mg ( Intravenous See Alternative 01/30/19 1343)  thiamine (VITAMIN B-1) tablet 100 mg (100 mg Oral Given 01/30/19 0941)    Or  thiamine (B-1) injection 100 mg ( Intravenous See Alternative 5/0/27 7412)  folic acid (FOLVITE) tablet 1 mg (1 mg Oral Given 01/30/19 0941)  multivitamin with minerals tablet 1 tablet (1 tablet Oral Given 01/30/19 0940)  nicotine (NICODERM CQ - dosed in mg/24 hours) patch 21 mg (21 mg Transdermal Patch Applied 01/30/19 0740)  hydrALAZINE (APRESOLINE) injection 5 mg (has no administration in time range)  insulin aspart (novoLOG) injection 0-9 Units (has no administration in time range)  insulin aspart (novoLOG) injection 0-5 Units (has no administration in time range)  furosemide (LASIX) injection 40 mg (40 mg Intravenous Given 01/30/19 0205)   acetaminophen (TYLENOL) tablet 1,000 mg (1,000 mg Oral Given 01/30/19 0204)    Mobility walks Low fall risk   Focused Assessments Pulmonary Assessment Handoff:  Lung sounds: Bilateral Breath Sounds: Diminished, Coarse crackles L Breath Sounds: Inspiratory wheezes, Coarse crackles R Breath Sounds: Inspiratory wheezes, Coarse crackles O2 Device: Nasal Cannula O2 Flow Rate (L/min): 3 L/min      R Recommendations: See Admitting Provider Note  Report given to:   Additional Notes:

## 2019-01-30 NOTE — BHH Counselor (Signed)
Delay in teleassessment as cart in ED is not working and the cart in pediatrics will have to be retrieved.

## 2019-01-30 NOTE — ED Notes (Signed)
Sitter at bedside d/t SI

## 2019-01-30 NOTE — BH Assessment (Addendum)
Assessment Note  [name] is an 58 y.o. male who was brought to [facility] by EMS.  [facility] was [facility] yesterday.  Patient presented orientated x3, mood "I'm angry" affect was labile with ranting, nsulting speech and circumstantial thought process.  Patient also would appear to be sleeping and then suddenly answer some questions and then appear to be sleeping again.  Patient reports SI, however would not answer to intent and plan questions.  He denied HI and reports VH, however would not provide specific details.  Patient reports smoking crack cocaine daily when not hospitalized and he has money.  Patient reports last smoking crack cocaine yesterday after hospital discharge.  Patient also reports being currently homeless.  The Patient has had 102 ED visits along with some hospitalizations in the past 6 months.  The tele assessment was ended when the Patient started on a rant with insulting language towards this Counselor and would not stop when asked.     Per Durene Cal, NP; Patient will be observed over night for safety and reassessed by psychiatry in the morning.    Dr. Ashok Cordia provided Patient's disposition.   Diagnosis:  Schizophrenia; Stimulant (Cocaine) Use Disorder, severe  Past Medical History:  Past Medical History:  Diagnosis Date  . Chronic foot pain   . Cocaine abuse ([place])   . Diabetes mellitus without complication ([place])   . Hepatitis C    unsure   . Homelessness   . Hypertension   . Neuropathy   . Polysubstance abuse ([place])   . Schizophrenia ([place])   . Sleep apnea   . Systolic and diastolic CHF, chronic ([place])     Past Surgical History:  Procedure Laterality Date  . MULTIPLE TOOTH EXTRACTIONS      Family History:  Family History  Problem Relation Age of Onset  . Hypertension Other   . Diabetes Other     Social History:  reports that he has been smoking cigarettes. He has a 20.00 pack-year smoking history. He uses smokeless tobacco. He reports  current alcohol use. He reports current drug use. Frequency: 7.00 times per week. Drugs: "Crack" cocaine, Cocaine, and Marijuana.  Additional Social History:  Substance #1 Name of Substance 1: Crack Cocaine 1 - Age of First Use: Unknown 1 - Amount (size/oz): Unknown 1 - Frequency: Daily iof he has the money 1 - Last Use / Amount: 01-29-2019  CIWA: CIWA-Ar BP: (!) 159/82 Pulse Rate: 98 COWS:    Allergies:  Allergies  Allergen Reactions  . Haldol [Haloperidol] Other (See Comments)    Muscle spasms, loss of voluntary movement. However, pt has taken Thorazine on multiple occasions with no adverse effects.     Home Medications: (Not in a hospital admission)   OB/GYN Status:  No LMP for male patient.  General Assessment Data Assessment unable to be completed: Yes Reason for not completing assessment: Pt somnolent and unable to participate. Location of Assessment: [facility] ED TTS Assessment: In system Is this a Tele or Face-to-Face Assessment?: Tele Assessment Is this an Initial Assessment or a Re-assessment for this encounter?: Initial Assessment Patient Accompanied by:: N/A Language Other than English: No Living Arrangements: Homeless/Shelter What gender do you identify as?: Male Marital status: Single Living Arrangements: Other (Comment)(Homeless) Can pt return to current living arrangement?: Yes Admission Status: Voluntary Is patient capable of signing voluntary admission?: Yes Referral Source: Self/Family/Friend Insurance type: Medicaid  Medical Screening Exam ([place]) Medical Exam completed: Yes  Crisis Care Plan Living Arrangements: Other (  Comment)(Homeless) Legal Guardian: Other:(Self) [facility] of [facility]rist: [facility] Name of Therapist: None  Education Status Is patient currently in school?: No Is the patient employed, unemployed or receiving disability?: Unemployed  Risk to self with the past 6 months Suicidal Ideation: Yes-Currently Present Has patient  been a risk to self within the past 6 months prior to admission? : Yes Suicidal Intent: No Has patient had any suicidal intent within the past 6 months prior to admission? : No Is patient at risk for suicide?: Yes Suicidal Plan?: No Has patient had any suicidal plan within the past 6 months prior to admission? : Yes Access to Means: No What has been your use of drugs/alcohol within the last 12 months?: crack cocaine Previous Attempts/Gestures: Yes How many times?: 3 Triggers for Past Attempts: Unknown Intentional Self Injurious Behavior: None Family Suicide History: No Recent stressful life event(s): Other (Comment)(homeless, suibstance user, noncompliance w/meds) Persecutory voices/beliefs?: No Depression: No Suicide prevention information given to non-admitted patients: Not applicable  Risk to Others within the past 6 months Homicidal Ideation: No Does patient have any lifetime risk of violence toward others beyond the six months prior to admission? : Yes (comment) Thoughts of Harm to Others: No-Not Currently Present/Within Last 6 Months Current Homicidal Intent: No Current Homicidal Plan: No Access to Homicidal Means: No History of harm to others?: No Assessment of Violence: None Noted Does patient have access to weapons?: No Criminal Charges Pending?: No Does patient have a court date: No Is patient on probation?: No  Psychosis Hallucinations: Visual Delusions: None noted  Mental Status Report Appearance/Hygiene: Unremarkable Eye Contact: Poor Motor Activity: Agitation Speech: Abusive Level of Consciousness: Irritable, Alert Mood: Angry, Irritable Affect: Angry, Labile Anxiety Level: Moderate Thought Processes: Circumstantial Judgement: Partial Orientation: Person, Place, Time Obsessive Compulsive Thoughts/Behaviors: None  Cognitive Functioning Concentration: Decreased Memory: Recent Intact, Remote Intact Is patient IDD: No Insight: Poor Impulse Control:  Poor Appetite: Good Have you had any weight changes? : No Change Sleep: Decreased Total Hours of Sleep: 4 Vegetative Symptoms: None  ADLScreening [facility] Assessment Services) Patient's cognitive ability adequate to safely complete daily activities?: Yes Patient able to express need for assistance with ADLs?: Yes Independently performs ADLs?: Yes (appropriate for developmental age)  Prior Inpatient Therapy Prior Inpatient Therapy: Yes Prior Therapy Dates: 2020 Prior [facility] Reason for Treatment: MH issues  Prior Outpatient Therapy Prior Outpatient Therapy: Yes Prior Therapy Dates: Ongoing Prior Therapy Facilty/Provider(s): [name] Reason for Treatment: Med mang Does patient have an ACCT team?: No Does patient have [name] services? : Yes Does patient have P4CC services?: No  ADL Screening (condition at time of admission) Patient's cognitive ability adequate to safely complete daily activities?: Yes Is the patient deaf or have difficulty hearing?: No Does the patient have difficulty seeing, even when wearing glasses/contacts?: No Does the patient have difficulty concentrating, remembering, or making decisions?: No Patient able to express need for assistance with ADLs?: Yes Does the patient have difficulty dressing or bathing?: No Independently performs ADLs?: Yes (appropriate for developmental age) Does the patient have difficulty walking or climbing stairs?: No Weakness of Legs: None Weakness of Arms/Hands: None  Home Assistive Devices/Equipment Home Assistive Devices/Equipment: None    Abuse/Neglect Assessment (Assessment to be complete while patient is alone) Physical Abuse: Denies Verbal Abuse: Denies Sexual Abuse: Denies Exploitation of patient/patient's resources: Denies Values / Beliefs Cultural Requests During Hospitalization: None Consults Spiritual Care Consult Needed: No Advance Directives (For Healthcare) Does Patient Have a  Medical Advance Directive?: No  Would patient like information on creating a medical advance directive?: No - Patient declined          Disposition:  Disposition Initial Assessment Completed for this Encounter: Yes  On Site Evaluation by:   Reviewed with Physician:    Dey-Johnson,Kenadie Royce 01/30/2019 8:36 AM

## 2019-01-30 NOTE — ED Provider Notes (Signed)
[facility]   CSN: [id number] Arrival date & time: 01/29/19  2249     History   Chief Complaint Chief Complaint  Patient presents with  . Suicidal  . Groin Swelling    HPI [name] is a 58 y.o. male.     Patient well-known to the ED.  History of cocaine abuse, diabetes, homelessness, CHF, noncompliance presents with worsening shortness of breath, leg swelling, scrotum swelling and abdomen swelling.  He was seen 24 hours ago x2 for the same.  He states compliance with his Lasix.  He had some brief episode of chest pain earlier after using some cocaine but this has resolved.  Now he complains of ongoing abdominal pain, leg swelling and testicle swelling.  He states he is homeless but is going to be set up to have housing on August 3.  He denies any fevers, chills, nausea or vomiting.  Patient states he has no place to stay and did not think he was going to make it through the night tonight.  The history is provided by the patient and the EMS personnel.    Past Medical History:  Diagnosis Date  . Chronic foot pain   . Cocaine abuse (HCC)   . Diabetes mellitus without complication (HCC)   . Hepatitis C    unsure   . Homelessness   . Hypertension   . Neuropathy   . Polysubstance abuse (HCC)   . Schizophrenia (HCC)   . Sleep apnea   . Systolic and diastolic CHF, chronic [facility](HCC)     Patient Active Problem List   Diagnosis Date Noted  . Combined systolic and diastolic heart failure (HCC) 01/19/2019  . Chest pain 01/16/2019  . 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  . Medically noncompliant   . Acute on chronic systolic CHF (congestive heart failure) (HCC) 01/02/2019  . 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 Acute on chronic systolic heart failure (HCC) 12/26/2018  . Mobitz type II atrioventricular block 12/25/2018  . Acute exacerbation of CHF (congestive heart  failure) (HCC) 12/22/2018  . Acute on chronic respiratory failure with hypoxia (HCC) 12/22/2018  . CHF exacerbation (HCC) 12/19/2018  . Acute CHF (congestive heart failure) (HCC) 12/16/2018  . Diabetes mellitus without complication (HCC) 12/11/2018  . Acute on chronic systolic (congestive) heart failure (HCC) 12/11/2018  . Abdominal pain 12/11/2018  . Nausea vomiting and diarrhea 12/11/2018  . Microcytic anemia 12/11/2018  . Evaluation by psychiatric service required   . MDD (major depressive disorder), severe (HCC) 11/25/2018  . Pressure injury of skin 11/08/2018  . Elevated troponin 10/18/2018  . HLD (hyperlipidemia) 10/18/2018  . Anxiety 10/18/2018  . CHF (congestive heart failure), NYHA class II, acute on chronic, combined (HCC) 10/18/2018  . Hypertensive urgency 10/12/2018  . Mild renal insufficiency 10/12/2018  . Cellulitis 10/12/2018  . Penile cellulitis   . Adjustment disorder with mixed disturbance of emotions and conduct   . Sleep apnea 10/03/2018  . Hypokalemia 09/17/2018  . Scrotal swelling 09/17/2018  . Urinary hesitancy   . Constipation   . Acute on chronic combined systolic and diastolic CHF (congestive heart failure) (HCC) 08/25/2018  . Tobacco use 08/18/2018  . Homelessness 08/08/2018  . Smoker 08/08/2018  . Prostate enlargement 03/16/2018  . Aortic atherosclerosis (HCC) 03/16/2018  . 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 Chronic foot pain   . Schizoaffective disorder, bipolar type (HCC) 09/30/2016  . Substance induced  mood disorder (HCC) 03/13/2015  . Schizophrenia, paranoid type (HCC) 01/17/2015  . Drug hallucinosis (HCC) 10/08/2014  . Chronic paranoid schizophrenia (HCC) 09/07/2014  . 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 Urinary retention   . Cocaine use disorder, severe, dependence (HCC)   . Essential hypertension 03/28/2013  . Insulin-requiring or dependent type II  diabetes mellitus (HCC) 03/15/2013    Past Surgical History:  Procedure Laterality Date  . MULTIPLE TOOTH EXTRACTIONS          Home Medications    Prior to Admission medications   Medication Sig Start Date End Date Taking? Authorizing Provider  acetaminophen (TYLENOL) 500 MG tablet Take 1 tablet (500 mg total) by mouth every 6 (six) hours as needed. Patient not taking: Reported on 01/19/2019 01/08/19   Fayrene Helperran, Bowie, PA-C  ARIPiprazole (ABILIFY) 10 MG tablet Take 1 tablet (10 mg total) by mouth daily. Patient not taking: Reported on 01/13/2019 12/10/18   Joycelyn DasPokhrel, Laxman, MD  carbamazepine (TEGRETOL) 200 MG tablet Take 0.5 tablets (100 mg total) by mouth 2 (two) times daily. Patient not taking: Reported on 01/15/2019 11/26/18   Mancel BaleWentz, Elliott, MD  furosemide (LASIX) 40 MG tablet Take 1 tablet (40 mg total) by mouth 2 (two) times daily. 01/29/19 02/28/19  Linwood DibblesKnapp, Jon, MD  glipiZIDE (GLUCOTROL) 5 MG tablet Take 0.5 tablets (2.5 mg total) by mouth 2 (two) times daily before a meal. Patient not taking: Reported on 01/19/2019 01/01/19   Rai, Delene Ruffiniipudeep K, MD  Ipratropium-Albuterol (COMBIVENT) 20-100 MCG/ACT AERS respimat Inhale 1 puff into the lungs every 6 (six) hours. Patient not taking: Reported on 01/15/2019 11/30/18   Palumbo, April, MD  lisinopril (ZESTRIL) 10 MG tablet Take 1 tablet (10 mg total) by mouth daily. Patient not taking: Reported on 01/15/2019 11/26/18   Mancel BaleWentz, Elliott, MD  potassium chloride SA (K-DUR) 20 MEQ tablet Take 1 tablet (20 mEq total) by mouth 2 (two) times daily. Patient not taking: Reported on 01/15/2019 11/30/18   Nicanor AlconPalumbo, April, MD    Family History Family History  Problem Relation Age of Onset  . Hypertension Other   . Diabetes Other     Social History Social History   Tobacco Use  . Smoking status: Current Every Day Smoker    Packs/day: 1.00    Years: 20.00    Pack years: 20.00    Types: Cigarettes  . Smokeless tobacco: Current User  Substance Use Topics  .  Alcohol use: Yes    Comment: Daily Drinker   . Drug use: Yes    Frequency: 7.0 times per week    Types: "Crack" cocaine, Cocaine, Marijuana    Comment: Crack     Allergies   Haldol [haloperidol]   Review of Systems Review of Systems  Constitutional: Positive for activity change and appetite change. Negative for fever.  HENT: Negative for congestion and rhinorrhea.   Respiratory: Positive for cough, chest tightness and shortness of breath.   Cardiovascular: Positive for leg swelling.  Gastrointestinal: Positive for abdominal pain.  Genitourinary: Positive for testicular pain.  Musculoskeletal: Positive for arthralgias and myalgias.  Neurological: Negative for dizziness, weakness, light-headedness and numbness.   all other systems are negative except as noted in the HPI and PMH.     Physical Exam Updated Vital Signs BP (!) 167/99   Pulse (!) 105   Temp 99.2 F (37.3 C) (Oral)   Resp (!) 23   SpO2 95%   Physical Exam Vitals signs and  nursing note reviewed.  Constitutional:      General: He is in acute distress.     Appearance: He is well-developed. He is obese.     Comments: Mild increased work of breathing, speaking short phrases Chronically ill-appearing  HENT:     Head: Normocephalic and atraumatic.     Mouth/Throat:     Pharynx: No oropharyngeal exudate.  Eyes:     Conjunctiva/sclera: Conjunctivae normal.     Pupils: Pupils are equal, round, and reactive to light.  Neck:     Musculoskeletal: Normal range of motion and neck supple.     Comments: No meningismus. Cardiovascular:     Rate and Rhythm: Normal rate and regular rhythm.     Heart sounds: Normal heart sounds. No murmur.  Pulmonary:     Effort: Pulmonary effort is normal. No respiratory distress.     Breath sounds: Rales present.     Comments: Bibasilar crackles Abdominal:     General: There is distension.     Palpations: Abdomen is soft.     Tenderness: There is no abdominal tenderness. There is  no guarding or rebound.  Genitourinary:    Comments: Large amount of swelling to testicles. Chronic fissure to left side of penile shaft. Musculoskeletal: Normal range of motion.        General: No tenderness.     Right lower leg: Edema present.     Left lower leg: Edema present.     Comments: Massive edema to lower extremities extending to thighs and scrotum.  Skin:    General: Skin is warm.     Capillary Refill: Capillary refill takes less than 2 seconds.  Neurological:     General: No focal deficit present.     Mental Status: He is alert and oriented to person, place, and time. Mental status is at baseline.     Cranial Nerves: No cranial nerve deficit.     Motor: No abnormal muscle tone.     Coordination: Coordination normal.     Comments:  5/5 strength throughout. CN 2-12 intact.Equal grip strength.   Psychiatric:        Behavior: Behavior normal.      ED Treatments / Results  Labs (all labs ordered are listed, but only abnormal results are displayed) Labs Reviewed  COMPREHENSIVE METABOLIC PANEL - Abnormal; Notable for the following components:      Result Value   Glucose, Bld 197 (*)    Calcium 8.6 (*)    Albumin 2.6 (*)    Alkaline Phosphatase 173 (*)    All other components within normal limits  ACETAMINOPHEN LEVEL - Abnormal; Notable for the following components:   Acetaminophen (Tylenol), Serum <10 (*)    All other components within normal limits  CBC - Abnormal; Notable for the following components:   RBC 3.93 (*)    Hemoglobin 8.4 (*)    HCT 30.0 (*)    MCV 76.3 (*)    MCH 21.4 (*)    MCHC 28.0 (*)    RDW 21.2 (*)    nRBC 0.5 (*)    All other components within normal limits  ETHANOL  SALICYLATE LEVEL  RAPID URINE DRUG SCREEN, HOSP PERFORMED  URINALYSIS, ROUTINE W REFLEX MICROSCOPIC  BRAIN NATRIURETIC PEPTIDE  TROPONIN I (HIGH SENSITIVITY)    EKG None  Radiology Dg Chest Portable 1 View  Result Date: 01/30/2019 CLINICAL DATA:  Shortness of  breath EXAM: PORTABLE CHEST 1 VIEW COMPARISON:  January 25, 2019 FINDINGS: There is  cardiomegaly with mild volume overload. There is chronic atelectasis versus scarring in the lingula. There may be small bilateral pleural effusions. There is no pneumothorax. Advanced degenerative changes are noted of the right shoulder. IMPRESSION: Cardiomegaly with mild volume overload. Electronically Signed   By: Constance Holster M.D.   On: 01/30/2019 01:37    Procedures Procedures (including critical care time)  Medications Ordered in ED Medications  furosemide (LASIX) injection 40 mg (has no administration in time range)     Initial Impression / Assessment and Plan / ED Course  I have reviewed the triage vital signs and the nursing notes.  Pertinent labs & imaging results that were available during my care of the patient were reviewed by me and considered in my medical decision making (see chart for details).       Patient here with worsening shortness of breath and anasarca with leg swelling and penile swelling.  Patient with mildly increased work of breathing and tachypnea with new oxygen requirement.  EKG is unchanged.  Patient given IV Lasix.  Creatinine is stable.  Hemoglobin is stable.  Patient clearly failing outpatient management of his CHF and anasarca.  He is noncompliant. He expressed SI to the nurse but not to me.  TTS consult will be obtained.  Given his new oxygen requirement and failure of outpatient treatment, patient will be admitted for IV diuresis.  D/w Dr. Marlowe Sax.  Final Clinical Impressions(s) / ED Diagnoses   Final diagnoses:  None    ED Discharge Orders    None       Brealyn Baril, Annie Main, MD 01/30/19 [phone]

## 2019-01-30 NOTE — Consult Note (Addendum)
Telepsych Consultation   Reason for Consult:  SI Referring Physician:  Dr. Dow Adolpharole Hall Location of Patient: [facility] Location of Provider: [facility]  Patient Identification: [name] MRN:  [id number] Principal Diagnosis: Cocaine abuse with cocaine-induced mood disorder (HCC) Diagnosis:  Principal Problem:   Cocaine abuse with cocaine-induced mood disorder (HCC) Active Problems:   Suicidal ideation   Homelessness   Acute respiratory failure with hypoxia (HCC)   Acute exacerbation of CHF (congestive heart failure) (HCC)   Scrotal edema   Total Time spent with patient: 1 hour  Subjective:   [name] is a 58 y.o. male patient admitted with acute on chronic combined systolic and diastolic CHF.  HPI:   Per chart review, patient was admitted with recurrent acute on chronic combined systolic and diastolic CHF in the setting of medication noncompliance. Psychiatry was consulted since patient endorses SI due to homelessness. He was seen by TTS this morning for SI. He reports crack cocaine use daily. UDS is positive for cocaine. Home medications include Abilify 10 mg daily and Tegretol 100 mg BID.   Of note, patient was seen by the psychiatry consult service on 6/9 for capacity evaluation for SNF placement. He was determined to have capacity to make decisions regarding placement.   On interview, [name] reports that he has been going through "a lot of stuff with the enemy and demons." He reports using cocaine daily. He is requesting rehab. He has not completed rehab for several years. He was sober for 2 years while in prison. He initially reports that he is suicidal and states it in a nonchalant manner. He reports, "I know how this hospital system works. I don't want to be taken off suicide precautions. My leg hurts and I don't want to go back to the streets." He is asked to be forthcoming with information so that he can be assisted with resources. He is  questioned about his prior hospitalization when he was recommended for SNF placement. He reports, "They were taking too long so long so I left." He reports that his cousin plans to help him find a boarding house when he gets his disability check. He denies HI or AVH.    Past Psychiatric History: Schizophrenia and cocaine abuse.  Risk to Self: None. Admits to malingering for shelter.  Risk to Others: None. Denies HI.  Prior Inpatient Therapy: Prior Inpatient Therapy: Yes Prior Therapy Dates: 2020 Prior Therapy Facilty/Provider(s): [facility], [facility] Reason for Treatment: MH issues Prior Outpatient Therapy: Prior Outpatient Therapy: Yes Prior Therapy Dates: Ongoing Prior Therapy Facilty/Provider(s): [name] Reason for Treatment: Med mang Does patient have an ACCT team?: No Does patient have [name] services? : Yes Does patient have P4[facility] services?: No  Past Medical History:  Past Medical History:  Diagnosis Date  . Chronic foot pain   . Cocaine abuse (HCC)   . Diabetes mellitus without complication (HCC)   . Hepatitis C    unsure   . Homelessness   . Hypertension   . Neuropathy   . Polysubstance abuse (HCC)   . Schizophrenia (HCC)   . Sleep apnea   . Systolic and diastolic CHF, chronic (HCC)     Past Surgical History:  Procedure Laterality Date  . MULTIPLE TOOTH EXTRACTIONS     Family History:  Family History  Problem Relation Age of Onset  . Hypertension Other   . Diabetes Other    Family Psychiatric  History: None per chart review.  Social History:  Social History  Substance and Sexual Activity  Alcohol Use Yes   Comment: Daily Drinker      Social History   Substance and Sexual Activity  Drug Use Yes  . Frequency: 7.0 times per week  . Types: "Crack" cocaine, Cocaine, Marijuana   Comment: Crack    Social History   Socioeconomic History  . Marital status: Single    Spouse name: Not on file  . Number of children: Not on file  . Years of education: Not on file   . Highest education level: Not on file  Occupational History  . Not on file  Social Needs  . Financial resource strain: Not on file  . Food insecurity    Worry: Not on file    Inability: Not on file  . Transportation needs    Medical: Not on file    Non-medical: Not on file  Tobacco Use  . Smoking status: Current Every Day Smoker    Packs/day: 1.00    Years: 20.00    Pack years: 20.00    Types: Cigarettes  . Smokeless tobacco: Current User  Substance and Sexual Activity  . Alcohol use: Yes    Comment: Daily Drinker   . Drug use: Yes    Frequency: 7.0 times per week    Types: "Crack" cocaine, Cocaine, Marijuana    Comment: Crack  . Sexual activity: Not Currently  Lifestyle  . Physical activity    Days per week: Not on file    Minutes per session: Not on file  . Stress: Not on file  Relationships  . Social Musician on phone: Not on file    Gets together: Not on file    Attends religious service: Not on file    Active member of club or organization: Not on file    Attends meetings of clubs or organizations: Not on file    Relationship status: Not on file  Other Topics Concern  . Not on file  Social History Narrative   ** Merged History Encounter **       Additional Social History: He is homeless. He reports daily cocaine use.     Allergies:   Allergies  Allergen Reactions  . Haldol [Haloperidol] Other (See Comments)    Muscle spasms, loss of voluntary movement. However, pt has taken Thorazine on multiple occasions with no adverse effects.     Labs:  Results for orders placed or performed during the hospital encounter of 01/29/19 (from the past 48 hour(s))  Comprehensive metabolic panel     Status: Abnormal   Collection Time: 01/29/19 11:13 PM  Result Value Ref Range   Sodium 139 135 - 145 mmol/L   Potassium 3.5 3.5 - 5.1 mmol/L   Chloride 105 98 - 111 mmol/L   CO2 25 22 - 32 mmol/L   Glucose, Bld 197 (H) 70 - 99 mg/dL   BUN 13 6 - 20 mg/dL    Creatinine, Ser 1.61 0.61 - 1.24 mg/dL   Calcium 8.6 (L) 8.9 - 10.3 mg/dL   Total Protein 7.1 6.5 - 8.1 g/dL   Albumin 2.6 (L) 3.5 - 5.0 g/dL   AST 23 15 - 41 U/L   ALT 12 0 - 44 U/L   Alkaline Phosphatase 173 (H) 38 - 126 U/L   Total Bilirubin 0.8 0.3 - 1.2 mg/dL   GFR calc non Af Amer >60 >60 mL/min   GFR calc Af Amer >60 >60 mL/min   Anion gap 9 5 -  15    Comment: Performed at [facility], [address] [place], [place] [address]  Ethanol     Status: None   Collection Time: 01/29/19 11:13 PM  Result Value Ref Range   Alcohol, Ethyl (B) <10 <10 mg/dL    Comment: (NOTE) Lowest detectable limit for serum alcohol is 10 mg/dL. For medical purposes only. Performed at [facility], [address] [place], [place] [address]   Salicylate level     Status: None   Collection Time: 01/29/19 11:13 PM  Result Value Ref Range   Salicylate Lvl <7.0 2.8 - 30.0 mg/dL    Comment: Performed at [facility], [address] [place], [place] [address]  Acetaminophen level     Status: Abnormal   Collection Time: 01/29/19 11:13 PM  Result Value Ref Range   Acetaminophen (Tylenol), Serum <10 (L) 10 - 30 ug/mL    Comment: (NOTE) Therapeutic concentrations vary significantly. A range of 10-30 ug/mL  may be an effective concentration for many patients. However, some  are best treated at concentrations outside of this range. Acetaminophen concentrations >150 ug/mL at 4 hours after ingestion  and >50 ug/mL at 12 hours after ingestion are often associated with  toxic reactions. Performed at [facility], [address] [place], [place] [address]   cbc     Status: Abnormal   Collection Time: 01/29/19 11:13 PM  Result Value Ref Range   WBC 8.1 4.0 - 10.5 K/uL   RBC 3.93 (L) 4.22 - 5.81 MIL/uL   Hemoglobin 8.4 (L) 13.0 - 17.0 g/dL    Comment: Reticulocyte Hemoglobin testing may be clinically indicated, consider ordering this additional test [id number]    HCT 30.0  (L) 39.0 - 52.0 %   MCV 76.3 (L) 80.0 - 100.0 fL   MCH 21.4 (L) 26.0 - 34.0 pg   MCHC 28.0 (L) 30.0 - 36.0 g/dL   RDW 86.521.2 (H) 78.411.5 - 69.615.5 %   Platelets 294 150 - 400 K/uL   nRBC 0.5 (H) 0.0 - 0.2 %    Comment: Performed at [facility], [address] [place], [place] [address]  Brain natriuretic peptide     Status: Abnormal   Collection Time: 01/30/19  1:31 AM  Result Value Ref Range   B Natriuretic Peptide 1,357.3 (H) 0.0 - 100.0 pg/mL    Comment: Performed at [facility], [address] [place], [place] [address]  Troponin I (High Sensitivity)     Status: Abnormal   Collection Time: 01/30/19  1:31 AM  Result Value Ref Range   Troponin I (High Sensitivity) 20 (H) <18 ng/L    Comment: (NOTE) Elevated high sensitivity troponin I (hsTnI) values and significant  changes across serial measurements may suggest ACS but many other  chronic and acute conditions are known to elevate hsTnI results.  Refer to the "Links" section for chest pain algorithms and additional  guidance. Performed at [facility], [address] [place], [place] [address]   SARS CORONAVIRUS 2 Nasal Swab Aptima Multi Swab     Status: None   Collection Time: 01/30/19  2:04 AM   Specimen: Aptima Multi Swab; Nasal Swab  Result Value Ref Range   SARS Coronavirus 2 NEGATIVE NEGATIVE    Comment: (NOTE) SARS-CoV-2 target nucleic acids are NOT DETECTED. The SARS-CoV-2 RNA is generally detectable in upper and lower respiratory specimens during the acute phase of infection. Negative results do  not preclude SARS-CoV-2 infection, do not rule out co-infections with other pathogens, and should not be used as the sole basis for treatment or other patient management decisions. Negative results must be combined with clinical observations, patient history, and epidemiological information. The expected result is Negative. Fact Sheet for Patients: HairSlick.nohttps://www.fda.gov/media/138098/download Fact Sheet for  Healthcare Providers: quierodirigir.comhttps://www.fda.gov/media/138095/download This test is not yet approved or cleared by the [place] FDA and  has been authorized for detection and/or diagnosis of SARS-CoV-2 by FDA under an Emergency Use Authorization (EUA). This EUA will remain  in effect (meaning this test can be used) for the duration of the COVID-19 declaration under Section 56 4(b)(1) of the Act, 21 U.S.C. section 360bbb-3(b)(1), unless the authorization is terminated or revoked sooner. Performed at [facility], [address] [place], [place] [address]   Troponin I (High Sensitivity)     Status: Abnormal   Collection Time: 01/30/19  3:17 AM  Result Value Ref Range   Troponin I (High Sensitivity) 23 (H) <18 ng/L    Comment: (NOTE) Elevated high sensitivity troponin I (hsTnI) values and significant  changes across serial measurements may suggest ACS but many other  chronic and acute conditions are known to elevate hsTnI results.  Refer to the "Links" section for chest pain algorithms and additional  guidance. Performed at [facility], [address] [place], [place] [address]   Rapid urine drug screen (hospital performed)     Status: Abnormal   Collection Time: 01/30/19  3:20 AM  Result Value Ref Range   Opiates NONE DETECTED NONE DETECTED   Cocaine POSITIVE (A) NONE DETECTED   Benzodiazepines NONE DETECTED NONE DETECTED   Amphetamines NONE DETECTED NONE DETECTED   Tetrahydrocannabinol NONE DETECTED NONE DETECTED   Barbiturates NONE DETECTED NONE DETECTED    Comment: (NOTE) DRUG SCREEN FOR MEDICAL PURPOSES ONLY.  IF CONFIRMATION IS NEEDED FOR ANY PURPOSE, NOTIFY LAB WITHIN 5 DAYS. LOWEST DETECTABLE LIMITS FOR URINE DRUG SCREEN Drug Class                     Cutoff (ng/mL) Amphetamine and metabolites    1000 Barbiturate and metabolites    200 Benzodiazepine                 200 Tricyclics and metabolites     300 Opiates and metabolites        300 Cocaine and  metabolites        300 THC                            50 Performed at [facility], [address] [place], [place] [address]   Urinalysis, Routine w reflex microscopic     Status: Abnormal   Collection Time: 01/30/19  3:20 AM  Result Value Ref Range   Color, Urine YELLOW YELLOW   APPearance CLEAR CLEAR   Specific Gravity, Urine 1.019 1.005 - 1.030   pH 5.0 5.0 - 8.0   Glucose, UA NEGATIVE NEGATIVE mg/dL   Hgb urine dipstick NEGATIVE NEGATIVE   Bilirubin Urine NEGATIVE NEGATIVE   Ketones, ur NEGATIVE NEGATIVE mg/dL   Protein, ur 244100 (A) NEGATIVE mg/dL   Nitrite NEGATIVE NEGATIVE   Leukocytes,Ua NEGATIVE NEGATIVE   RBC / HPF 0-5 0 - 5 RBC/hpf   WBC, UA 0-5 0 - 5 WBC/hpf   Bacteria, UA RARE (A) NONE SEEN   Squamous Epithelial / LPF  0-5 0 - 5   Mucus PRESENT    Hyaline Casts, UA PRESENT     Comment: Performed at [facility], [place] [address] [place], [place] [address]    Medications:  Current Facility-Administered Medications  Medication Dose Route Frequency Provider Last Rate Last Dose  . acetaminophen (TYLENOL) tablet 650 mg  650 mg Oral Q6H PRN Shela Leff, MD       Or  . acetaminophen (TYLENOL) suppository 650 mg  650 mg Rectal Q6H PRN Shela Leff, MD      . enoxaparin (LOVENOX) injection 40 mg  40 mg Subcutaneous Daily Shela Leff, MD      . folic acid (FOLVITE) tablet 1 mg  1 mg Oral Daily Shela Leff, MD   1 mg at 01/30/19 0941  . furosemide (LASIX) injection 40 mg  40 mg Intravenous BID Shela Leff, MD   40 mg at 01/30/19 0740  . hydrALAZINE (APRESOLINE) injection 5 mg  5 mg Intravenous Q4H PRN Shela Leff, MD      . insulin aspart (novoLOG) injection 0-5 Units  0-5 Units Subcutaneous QHS Shela Leff, MD      . insulin aspart (novoLOG) injection 0-9 Units  0-9 Units Subcutaneous TID WC Shela Leff, MD      . LORazepam (ATIVAN) tablet 1 mg  1 mg Oral Q6H PRN Shela Leff, MD       Or  . LORazepam  (ATIVAN) injection 1 mg  1 mg Intravenous Q6H PRN Shela Leff, MD      . multivitamin with minerals tablet 1 tablet  1 tablet Oral Daily Shela Leff, MD   1 tablet at 01/30/19 0940  . nicotine (NICODERM CQ - dosed in mg/24 hours) patch 21 mg  21 mg Transdermal Daily Shela Leff, MD   21 mg at 01/30/19 0740  . thiamine (VITAMIN B-1) tablet 100 mg  100 mg Oral Daily Shela Leff, MD   100 mg at 01/30/19 0941   Or  . thiamine (B-1) injection 100 mg  100 mg Intravenous Daily Shela Leff, MD       Current Outpatient Medications  Medication Sig Dispense Refill  . furosemide (LASIX) 40 MG tablet Take 1 tablet (40 mg total) by mouth 2 (two) times daily. 60 tablet 0  . glipiZIDE (GLUCOTROL) 5 MG tablet Take 0.5 tablets (2.5 mg total) by mouth 2 (two) times daily before a meal. 60 tablet 4  . acetaminophen (TYLENOL) 500 MG tablet Take 1 tablet (500 mg total) by mouth every 6 (six) hours as needed. (Patient not taking: Reported on 01/19/2019) 30 tablet 0  . ARIPiprazole (ABILIFY) 10 MG tablet Take 1 tablet (10 mg total) by mouth daily. (Patient not taking: Reported on 01/13/2019) 30 tablet 2  . carbamazepine (TEGRETOL) 200 MG tablet Take 0.5 tablets (100 mg total) by mouth 2 (two) times daily. (Patient not taking: Reported on 01/15/2019) 60 tablet 2  . Ipratropium-Albuterol (COMBIVENT) 20-100 MCG/ACT AERS respimat Inhale 1 puff into the lungs every 6 (six) hours. (Patient not taking: Reported on 01/15/2019) 1 Inhaler 0  . lisinopril (ZESTRIL) 10 MG tablet Take 1 tablet (10 mg total) by mouth daily. (Patient not taking: Reported on 01/15/2019) 30 tablet 1  . potassium chloride SA (K-DUR) 20 MEQ tablet Take 1 tablet (20 mEq total) by mouth 2 (two) times daily. (Patient not taking: Reported on 01/15/2019) 10 tablet 0    Musculoskeletal: Strength & Muscle Tone: No atrophy noted. Gait & Station: UTA since patient is lying  in bed. Patient leans: N/A  Psychiatric Specialty  Exam: Physical Exam  Nursing note and vitals reviewed. Constitutional: He is oriented to person, place, and time. He appears well-developed and well-nourished.  HENT:  Head: Normocephalic and atraumatic.  Neck: Normal range of motion.  Respiratory: Effort normal.  Musculoskeletal: Normal range of motion.  Neurological: He is alert and oriented to person, place, and time.  Psychiatric: His speech is normal and behavior is normal. Judgment and thought content normal. His affect is labile. Cognition and memory are normal.    Review of Systems  Psychiatric/Behavioral: Positive for substance abuse. Negative for hallucinations and suicidal ideas.  All other systems reviewed and are negative.   Blood pressure (!) 177/101, pulse 96, temperature 99.2 F (37.3 C), temperature source Oral, resp. rate 20, SpO2 96 %.There is no height or weight on file to calculate BMI.  General Appearance: Fairly Groomed, middle aged, African American male, wearing a hospital gown who is lying in bed and eating graham crackers. NAD.   Eye Contact:  Good  Speech:  Clear and Coherent and Normal Rate  Volume:  Normal  Mood:  Dysphoric  Affect:  Labile at times but redirectable.   Thought Process:  Goal Directed, Linear and Descriptions of Associations: Intact  Orientation:  Full (Time, Place, and Person)  Thought Content:  Logical  Suicidal Thoughts:  No  Homicidal Thoughts:  No  Memory:  Immediate;   Good Recent;   Good Remote;   Good  Judgement:  Fair  Insight:  Fair  Psychomotor Activity:  Normal  Concentration:  Concentration: Good and Attention Span: Good  Recall:  Good  Fund of Knowledge:  Good  Language:  Good  Akathisia:  No  Handed:  Right  AIMS (if indicated):   N/A  Assets:  Communication Skills Desire for Improvement Financial Resources/Insurance Resilience Social Support  ADL's:  Intact  Cognition:  WNL  Sleep:   N/A   Assessment:  HORACE LUKAS is a 58 y.o. male who was  admitted with recurrent acute on chronic combined systolic and diastolic CHF in the setting of medication noncompliance. Patient admits to endorsing SI for secondary gain for shelter. He is not acutely suicidal and his behavior is more consistent with someone who is acting in self-preservation. He is future oriented and would like resources for rehab. He denies HI or AVH. There is no evidence of a psychiatric condition which is amendable to inpatient psychiatric hospitalization. Patient's SI appears to be conditional and is a means of manipulation without a true intention to harm self.    Treatment Plan Summary: -Restart home Abilify 10 mg daily and Tegretol 100 mg BID for mood stabilization. -EKG reviewed and QTc 429. Please closely monitor when starting or increasing QTc prolonging agents.  -Please have SW provide patient with resources for rehab.  -Psychiatry will sign off on patient at this time. Please consult psychiatry again as needed.   Disposition: No evidence of imminent risk to self or others at present.   Patient does not meet criteria for psychiatric inpatient admission.  This service was provided via telemedicine using a 2-way, interactive audio and video technology.  Names of all persons participating in this telemedicine service and their role in this encounter. Name: Juanetta Beets, DO Role: Psychiatry  Name: [name] Role: Patient    Cherly Beach, DO 01/30/2019 10:44 AM

## 2019-01-30 NOTE — Progress Notes (Addendum)
At 2200, patient was given milk and graham crackers at 2200, and was told this would be the remainder of his fluids.  At 2230 - RN rechecked pulse ox for an oxygen sat of in the 60s and 70s as the patient is asleep snoring. Sats are in the 90s when awake. RN had continuous pulse ox set up and 4 L of [place] to keep sats above 92% per order.  At 2330, patient requested pain medicine.  At 2332, patient grew very agitated and shouted at the sitter. Patient was so agitated he would not let RN give the requested pain medicine until charge nurse talked to him. Patient then insisted he wanted milk or orange juice despite being reminded he's reached his 1200 ml fluid restriction. Rn gave him his request of 4 more graham cracker packs and after charge nurse and RN continued to deescalate, patient agreed to go back to sleep.   At 0000, patient took of nasal cannula for 3rd time since 2230.  Oxygen sat will not stay above 92% without oxygen.

## 2019-01-30 NOTE — Progress Notes (Signed)
CSW received consult for patient for his homeless issues. CSW met with patient at bedside with sitter present in attempts to have a discussion regarding his lack of housing and chronic ED visits. Patient extraordinarily difficult to understand due to him mumbling and not forming comprehensible words. CSW asked patient if he needed new resources and he fell asleep and did not arouse once CSW stated his name.  Madilyn Fireman, MSW, LCSW-A Clinical Social Worker [facility] [phone]

## 2019-01-30 NOTE — ED Notes (Signed)
Pt sitter had to leave she did not have a replacement. Pt was sleeping. Sitter informed this tech and this tech informed RN Myland

## 2019-01-31 DIAGNOSIS — F1414 Cocaine abuse with cocaine-induced mood disorder: Secondary | ICD-10-CM

## 2019-01-31 LAB — BASIC METABOLIC PANEL
Anion gap: 10 (ref 5–15)
BUN: 15 mg/dL (ref 6–20)
CO2: 29 mmol/L (ref 22–32)
Calcium: 8.5 mg/dL — ABNORMAL LOW (ref 8.9–10.3)
Chloride: 102 mmol/L (ref 98–111)
Creatinine, Ser: 0.87 mg/dL (ref 0.61–1.24)
GFR calc Af Amer: >60 mL/min (ref >60)
GFR calc non Af Amer: >60 mL/min (ref >60)
Glucose, Bld: 200 mg/dL — ABNORMAL HIGH (ref 70–99)
Potassium: 3.6 mmol/L (ref 3.5–5.1)
Sodium: 141 mmol/L (ref 135–145)

## 2019-01-31 LAB — GLUCOSE, CAPILLARY
Glucose-Capillary: 188 mg/dL — ABNORMAL HIGH (ref 70–99)
Glucose-Capillary: 213 mg/dL — ABNORMAL HIGH (ref 70–99)
Glucose-Capillary: 172 mg/dL — ABNORMAL HIGH (ref 70–99)

## 2019-01-31 LAB — MAGNESIUM: Magnesium: 2 mg/dL (ref 1.7–2.4)

## 2019-01-31 MED ORDER — SENNOSIDES-DOCUSATE SODIUM 8.6-50 MG PO TABS
2.0000 | ORAL_TABLET | Freq: Two times a day (BID) | ORAL | Status: DC
  Administered 2019-01-31: 2 via ORAL
  Filled 2019-01-31: qty 2

## 2019-01-31 NOTE — Progress Notes (Signed)
Pt refusing to keep his oxygen and continuous pulse ox on.  PT had 3 beat run of V-tach at 12:18 per tele.  Pt was sleeping when it happen. Asymptomatic. Notified attending. Fluid restriction will be increased to 1800 ml

## 2019-01-31 NOTE — Progress Notes (Signed)
Pt got agitated when he found out he can't drink more than 1200 ml. He drank 280 ml of Sprite with his morning medication. Pt keep asking for sprite and graham crackers. Pt get agitated and keep screaming at staffs whenever we told him about the importance of fluid restriction.

## 2019-01-31 NOTE — Progress Notes (Signed)
   Per nurse, patient has been admitted to the medical floor. Patient was psychiatrically cleared by Dr. Mariea Clonts yesterday following psychiatric consultation.

## 2019-01-31 NOTE — Progress Notes (Signed)
PROGRESS NOTE    [name]  JXB:[id number] DOB: 1960-09-10 DOA: 01/29/2019 PCP: Patient, No Pcp Per    Brief Narrative:   [name] is a 58 y.o. male with medical history significant of chronic combined systolic and diastolic congestive heart failure with EF 25 to 30%, insulin-dependent type 2 diabetes mellitus, schizophrenia, homelessness, sleep apnea, cocaine abuse, noncompliance to medications with frequent admissions, often leaving AMA, now presenting to the hospital for evaluation of scrotal and bilateral lower extremity edema.  Patient reports having edema in his legs and scrotum for the past 1 year.  States this has been an ongoing problem and worse now.  Reports having shortness of breath.  States he coughs only when smoking cigarettes.  States he is not able to take any medications as [place] on the streets.  States the only time he gets medications is when he is admitted to the hospital.  He is requesting to be admitted.  States he wants to hurt himself because he lives on the streets and is tired of the life that he is living.  He was agitated and no additional history could be obtained from him.  ED Course: Tachycardic and tachypneic.  Systolic in the 160s.  SPO2 88% on room air and placed on 2 L supplemental oxygen.  No leukocytosis.  UDS pending.  Blood ethanol level negative.  Salicylate level negative.  Acetaminophen level negative.  UA pending.  BNP 1357, chronically elevated.  High-sensitivity troponin pending.  COVID-19 rapid test pending.  Chest x-ray showing cardiomegaly with mild volume overload.  Patient received IV Lasix 40 mg and Tylenol in the ED.   Assessment & Plan:   Principal Problem:   Cocaine abuse with cocaine-induced mood disorder (HCC) Active Problems:   Suicidal ideation   Homelessness   Acute respiratory failure with hypoxia (HCC)   Acute exacerbation of CHF (congestive heart failure) (HCC)   Scrotal edema   Acute hypoxic  respiratory failure Acute on chronic combined systolic/diastolic CHF exacerbation Patient presenting with worsening lower extremity/scrotal edema associated with shortness of breath.  History of medication noncompliance and leaving AGAINST MEDICAL ADVICE; also complicated by homelessness.  BNP elevated to 1357 on admission.  COVID-19 test negative.  Chest x-ray with cardiomegaly and mild volume overload. TTE 11/07/2018 with EF 25-30%, moderate concentric LVH, diastolic dysfunction, severely reduced systolic function RV, diffuse hypokinesis LV, RVSP 51.2 mmHg. --net negative 2.8L past 24h; net negative 3.7L since admission --Wt 245-->240lbs --Continue diuresis with furosemide 40 mg IV TID --Fluid restriction to 1800 mL's per day --Continue to titrate supplemental oxygen to maintain SPO2 greater than 92% --Strict I's and O's, daily weights  Polysubstance abuse Patient with history of tobacco, alcohol and cocaine abuse.  UDS this admission continues to be positive for cocaine.  Alcohol level less than 10.  Discussed with patient need for total cessation. --Nicotine patch --CIWAA protocol --Thiamine, folic acid, multivitamin --SW for assistance with outpatient resources; given his history likely to be noncompliant  Type 2 diabetes mellitus Hemoglobin A1c 9.7 on 12/04/2018.  On glipizide 2.5mg  PO BID at home, but likely noncompliant. --Holding oral hypoglycemics while inpatient --Consistent carbohydrate diet --Insulin sliding scale for coverage while inpatient  Schizophrenia / Depression Evaluated by psychiatry, Dr. Sharma CovertNorman on 8/1.  No need for inpatient psychiatry.  Was evaluated for capacity on 12/08/2018 and was determined to have capacity make decisions regarding placement/medical decisions.  Psychiatry recommends outpatient follow-up with rehabilitation. --Tegretol 100 mg p.o. twice daily --Abilify 10 mg  p.o. daily --Outpatient follow-up   DVT prophylaxis: Lovenox Code Status: Full code  Family Communication: None Disposition Plan: Likely disposition to homeless shelter when medically stable.   Consultants:   Psychiatry, Dr. Mariea Clonts - signed off 8/1  Procedures:   none  Antimicrobials:   none   Subjective: Patient seen and examined at bedside, sleeping but easily arousable.  No specific implants this morning.  States he is not hungry.  Has been refusing his oxygen and continuous pulse ox with agitation per nursing staff unless he gets soda/crackers.  Patient without any other complaints at this time.  Denies headache, no fever/chills/night sweats, no chest pain, no palpitations, no abdominal pain.  No other acute events overnight per nurse staff.  Objective: Vitals:   01/31/19 0500 01/31/19 0717 01/31/19 1111 01/31/19 1233  BP:  (!) 164/116 (!) 148/84 134/77  Pulse:  98 100 99  Resp:  20 20 19   Temp:  98.2 F (36.8 C) 98.1 F (36.7 C) 98.3 F (36.8 C)  TempSrc:  Oral Oral Oral  SpO2:  100% 100% 95%  Weight: 109.1 kg     Height:        Intake/Output Summary (Last 24 hours) at 01/31/2019 1332 Last data filed at 01/31/2019 1159 Gross per 24 hour  Intake 2259 ml  Output 4100 ml  Net -1841 ml   Filed Weights   01/30/19 1510 01/31/19 0500  Weight: 111.3 kg 109.1 kg    Examination:  General exam: Mild agitation Respiratory system: Breath sounds decreased bilateral bases with crackles, normal respiratory effort, continues on 4 L nasal cannula Cardiovascular system: S1 & S2 heard, RRR. No JVD, murmurs, rubs, gallops or clicks. No pedal edema. Gastrointestinal system: Abdomen is nondistended, soft and nontender. No organomegaly or masses felt. Normal bowel sounds heard. Central nervous system: Alert and oriented. No focal neurological deficits. Extremities: Symmetric 5 x 5 power.  Edema bilateral lower extremities extending up to scrotum/abdomen. Skin: No rashes, lesions or ulcers Psychiatry: Mild agitation, flat affect, depressed mood    Data Reviewed:  I have personally reviewed following labs and imaging studies  CBC: Recent Labs  Lab 01/25/19 1122 01/26/19 1117 01/29/19 0853 01/29/19 2313  WBC 7.7 7.6 8.3 8.1  NEUTROABS 6.9  --   --   --   HGB 9.0* 8.3* 8.6* 8.4*  HCT 33.0* 28.8* 30.7* 30.0*  MCV 80.5 75.4* 77.5* 76.3*  PLT 355 343 294 106   Basic Metabolic Panel: Recent Labs  Lab 01/25/19 1122 01/26/19 1117 01/29/19 0853 01/29/19 2313 01/31/19 0404  NA 138 140 138 139 141  K 4.5 3.9 3.5 3.5 3.6  CL 104 106 104 105 102  CO2 22 24 26 25 29   GLUCOSE 195* 181* 215* 197* 200*  BUN 19 18 13 13 15   CREATININE 1.10 1.04 0.99 0.98 0.87  CALCIUM 8.7* 8.6* 8.6* 8.6* 8.5*  MG  --   --   --   --  2.0   GFR: Estimated Creatinine Clearance: 114.1 mL/min (by C-G formula based on SCr of 0.87 mg/dL). Liver Function Tests: Recent Labs  Lab 01/25/19 1122 01/29/19 0853 01/29/19 2313  AST 24 22 23   ALT 12 11 12   ALKPHOS 155* 171* 173*  BILITOT 0.9 0.8 0.8  PROT 6.9 6.8 7.1  ALBUMIN 2.7* 2.6* 2.6*   No results for input(s): LIPASE, AMYLASE in the last 168 hours. No results for input(s): AMMONIA in the last 168 hours. Coagulation Profile: No results for input(s): INR, PROTIME in the  last 168 hours. Cardiac Enzymes: No results for input(s): CKTOTAL, CKMB, CKMBINDEX, TROPONINI in the last 168 hours. BNP (last 3 results) No results for input(s): PROBNP in the last 8760 hours. HbA1C: No results for input(s): HGBA1C in the last 72 hours. CBG: Recent Labs  Lab 01/30/19 1332 01/30/19 1827 01/30/19 2127 01/31/19 0732 01/31/19 1107  GLUCAP 164* 213* 167* 188* 213*   Lipid Profile: No results for input(s): CHOL, HDL, LDLCALC, TRIG, CHOLHDL, LDLDIRECT in the last 72 hours. Thyroid Function Tests: No results for input(s): TSH, T4TOTAL, FREET4, T3FREE, THYROIDAB in the last 72 hours. Anemia Panel: No results for input(s): VITAMINB12, FOLATE, FERRITIN, TIBC, IRON, RETICCTPCT in the last 72 hours. Sepsis Labs: No results for  input(s): PROCALCITON, LATICACIDVEN in the last 168 hours.  Recent Results (from the past 240 hour(s))  SARS Coronavirus 2 (CEPHEID - Performed in [facility] hospital lab), Hosp Order     Status: None   Collection Time: 01/23/19  7:57 PM   Specimen: Nasopharyngeal Swab  Result Value Ref Range Status   SARS Coronavirus 2 NEGATIVE NEGATIVE Final    Comment: (NOTE) If result is NEGATIVE SARS-CoV-2 target nucleic acids are NOT DETECTED. The SARS-CoV-2 RNA is generally detectable in upper and lower  respiratory specimens during the acute phase of infection. The lowest  concentration of SARS-CoV-2 viral copies this assay can detect is 250  copies / mL. A negative result does not preclude SARS-CoV-2 infection  and should not be used as the sole basis for treatment or other  patient management decisions.  A negative result may occur with  improper specimen collection / handling, submission of specimen other  than nasopharyngeal swab, presence of viral mutation(s) within the  areas targeted by this assay, and inadequate number of viral copies  (<250 copies / mL). A negative result must be combined with clinical  observations, patient history, and epidemiological information. If result is POSITIVE SARS-CoV-2 target nucleic acids are DETECTED. The SARS-CoV-2 RNA is generally detectable in upper and lower  respiratory specimens dur ing the acute phase of infection.  Positive  results are indicative of active infection with SARS-CoV-2.  Clinical  correlation with patient history and other diagnostic information is  necessary to determine patient infection status.  Positive results do  not rule out bacterial infection or co-infection with other viruses. If result is PRESUMPTIVE POSTIVE SARS-CoV-2 nucleic acids MAY BE PRESENT.   A presumptive positive result was obtained on the submitted specimen  and confirmed on repeat testing.  While 2019 novel coronavirus  (SARS-CoV-2) nucleic acids may be  present in the submitted sample  additional confirmatory testing may be necessary for epidemiological  and / or clinical management purposes  to differentiate between  SARS-CoV-2 and other Sarbecovirus currently known to infect humans.  If clinically indicated additional testing with an alternate test  methodology [phone]([id number]) is advised. The SARS-CoV-2 RNA is generally  detectable in upper and lower respiratory sp ecimens during the acute  phase of infection. The expected result is Negative. Fact Sheet for Patients:  BoilerBrush.com.cyhttps://www.fda.gov/media/136312/download Fact Sheet for Healthcare Providers: https://pope.com/https://www.fda.gov/media/136313/download This test is not yet approved or cleared by the [place] FDA and has been authorized for detection and/or diagnosis of SARS-CoV-2 by FDA under an Emergency Use Authorization (EUA).  This EUA will remain in effect (meaning this test can be used) for the duration of the COVID-19 declaration under Section 564(b)(1) of the Act, 21 U.S.C. section 360bbb-3(b)(1), unless the authorization is terminated or revoked sooner. Performed at  [facility], [address] [place], [place] [address]   SARS Coronavirus 2 (CEPHEID - Performed in [facility] hospital lab), Hosp Order     Status: None   Collection Time: 01/25/19 11:40 AM   Specimen: Nasopharyngeal Swab  Result Value Ref Range Status   SARS Coronavirus 2 NEGATIVE NEGATIVE Final    Comment: (NOTE) If result is NEGATIVE SARS-CoV-2 target nucleic acids are NOT DETECTED. The SARS-CoV-2 RNA is generally detectable in upper and lower  respiratory specimens during the acute phase of infection. The lowest  concentration of SARS-CoV-2 viral copies this assay can detect is 250  copies / mL. A negative result does not preclude SARS-CoV-2 infection  and should not be used as the sole basis for treatment or other  patient management decisions.  A negative result may occur with  improper  specimen collection / handling, submission of specimen other  than nasopharyngeal swab, presence of viral mutation(s) within the  areas targeted by this assay, and inadequate number of viral copies  (<250 copies / mL). A negative result must be combined with clinical  observations, patient history, and epidemiological information. If result is POSITIVE SARS-CoV-2 target nucleic acids are DETECTED. The SARS-CoV-2 RNA is generally detectable in upper and lower  respiratory specimens dur ing the acute phase of infection.  Positive  results are indicative of active infection with SARS-CoV-2.  Clinical  correlation with patient history and other diagnostic information is  necessary to determine patient infection status.  Positive results do  not rule out bacterial infection or co-infection with other viruses. If result is PRESUMPTIVE POSTIVE SARS-CoV-2 nucleic acids MAY BE PRESENT.   A presumptive positive result was obtained on the submitted specimen  and confirmed on repeat testing.  While 2019 novel coronavirus  (SARS-CoV-2) nucleic acids may be present in the submitted sample  additional confirmatory testing may be necessary for epidemiological  and / or clinical management purposes  to differentiate between  SARS-CoV-2 and other Sarbecovirus currently known to infect humans.  If clinically indicated additional testing with an alternate test  methodology [phone]([id number]) is advised. The SARS-CoV-2 RNA is generally  detectable in upper and lower respiratory sp ecimens during the acute  phase of infection. The expected result is Negative. Fact Sheet for Patients:  BoilerBrush.com.cyhttps://www.fda.gov/media/136312/download Fact Sheet for Healthcare Providers: https://pope.com/https://www.fda.gov/media/136313/download This test is not yet approved or cleared by the [place] FDA and has been authorized for detection and/or diagnosis of SARS-CoV-2 by FDA under an Emergency Use Authorization (EUA).  This EUA will remain in  effect (meaning this test can be used) for the duration of the COVID-19 declaration under Section 564(b)(1) of the Act, 21 U.S.C. section 360bbb-3(b)(1), unless the authorization is terminated or revoked sooner. Performed at [facility], [address] [place], [place] [address]   SARS CORONAVIRUS 2 Nasal Swab Aptima Multi Swab     Status: None   Collection Time: 01/30/19  2:04 AM   Specimen: Aptima Multi Swab; Nasal Swab  Result Value Ref Range Status   SARS Coronavirus 2 NEGATIVE NEGATIVE Final    Comment: (NOTE) SARS-CoV-2 target nucleic acids are NOT DETECTED. The SARS-CoV-2 RNA is generally detectable in upper and lower respiratory specimens during the acute phase of infection. Negative results do not preclude SARS-CoV-2 infection, do not rule out co-infections with other pathogens, and should not be used as the sole basis for treatment or other patient management decisions. Negative results must be combined with clinical observations, patient history,  and epidemiological information. The expected result is Negative. Fact Sheet for Patients: HairSlick.no Fact Sheet for Healthcare Providers: quierodirigir.com This test is not yet approved or cleared by the [place] FDA and  has been authorized for detection and/or diagnosis of SARS-CoV-2 by FDA under an Emergency Use Authorization (EUA). This EUA will remain  in effect (meaning this test can be used) for the duration of the COVID-19 declaration under Section 56 4(b)(1) of the Act, 21 U.S.C. section 360bbb-3(b)(1), unless the authorization is terminated or revoked sooner. Performed at [facility], [address] [place], [place] [address]          Radiology Studies: Dg Chest Portable 1 View  Result Date: 01/30/2019 CLINICAL DATA:  Shortness of breath EXAM: PORTABLE CHEST 1 VIEW COMPARISON:  January 25, 2019 FINDINGS: There is cardiomegaly with mild  volume overload. There is chronic atelectasis versus scarring in the lingula. There may be small bilateral pleural effusions. There is no pneumothorax. Advanced degenerative changes are noted of the right shoulder. IMPRESSION: Cardiomegaly with mild volume overload. Electronically Signed   By: Katherine Mantle M.D.   On: 01/30/2019 01:37        Scheduled Meds: . ARIPiprazole  10 mg Oral Daily  . carbamazepine  100 mg Oral BID  . enoxaparin (LOVENOX) injection  40 mg Subcutaneous Daily  . folic acid  1 mg Oral Daily  . furosemide  40 mg Intravenous TID  . insulin aspart  0-5 Units Subcutaneous QHS  . insulin aspart  0-9 Units Subcutaneous TID WC  . multivitamin with minerals  1 tablet Oral Daily  . nicotine  21 mg Transdermal Daily  . senna-docusate  2 tablet Oral BID  . thiamine  100 mg Oral Daily   Or  . thiamine  100 mg Intravenous Daily   Continuous Infusions:   LOS: 1 day    Time spent: 35 minutes    Alvira Philips [place], DO [facility] Pager [phone]  If 7PM-7AM, please contact night-coverage [email or web address] Password [email or web address] 01/31/2019, 1:32 PM

## 2019-02-01 ENCOUNTER — Inpatient Hospital Stay (HOSPITAL_COMMUNITY)
Admission: EM | Admit: 2019-02-01 | Discharge: 2019-02-02 | DRG: 291 | Discharge status: Against Medical Advice | Payer: Medicare Other | Attending: Family Medicine | Admitting: Family Medicine

## 2019-02-01 ENCOUNTER — Encounter (HOSPITAL_COMMUNITY): Payer: Self-pay | Admitting: *Deleted

## 2019-02-01 ENCOUNTER — Emergency Department (HOSPITAL_COMMUNITY): Payer: Medicare Other

## 2019-02-01 ENCOUNTER — Other Ambulatory Visit: Payer: Self-pay

## 2019-02-01 DIAGNOSIS — Z9119 Patient's noncompliance with other medical treatment and regimen: Secondary | ICD-10-CM

## 2019-02-01 DIAGNOSIS — F329 Major depressive disorder, single episode, unspecified: Secondary | ICD-10-CM | POA: Diagnosis present

## 2019-02-01 DIAGNOSIS — R601 Generalized edema: Secondary | ICD-10-CM

## 2019-02-01 DIAGNOSIS — Z9114 Patient's other noncompliance with medication regimen: Secondary | ICD-10-CM

## 2019-02-01 DIAGNOSIS — Z888 Allergy status to other drugs, medicaments and biological substances status: Secondary | ICD-10-CM

## 2019-02-01 DIAGNOSIS — E119 Type 2 diabetes mellitus without complications: Secondary | ICD-10-CM

## 2019-02-01 DIAGNOSIS — I110 Hypertensive heart disease with heart failure: Secondary | ICD-10-CM | POA: Diagnosis not present

## 2019-02-01 DIAGNOSIS — Z5321 Procedure and treatment not carried out due to patient leaving prior to being seen by health care provider: Secondary | ICD-10-CM | POA: Diagnosis not present

## 2019-02-01 DIAGNOSIS — G4730 Sleep apnea, unspecified: Secondary | ICD-10-CM | POA: Diagnosis present

## 2019-02-01 DIAGNOSIS — I5023 Acute on chronic systolic (congestive) heart failure: Secondary | ICD-10-CM | POA: Diagnosis present

## 2019-02-01 DIAGNOSIS — Z833 Family history of diabetes mellitus: Secondary | ICD-10-CM

## 2019-02-01 DIAGNOSIS — N5089 Other specified disorders of the male genital organs: Secondary | ICD-10-CM | POA: Diagnosis present

## 2019-02-01 DIAGNOSIS — Z8249 Family history of ischemic heart disease and other diseases of the circulatory system: Secondary | ICD-10-CM

## 2019-02-01 DIAGNOSIS — I700 Atherosclerosis of aorta: Secondary | ICD-10-CM | POA: Diagnosis present

## 2019-02-01 DIAGNOSIS — Z7984 Long term (current) use of oral hypoglycemic drugs: Secondary | ICD-10-CM

## 2019-02-01 DIAGNOSIS — J9601 Acute respiratory failure with hypoxia: Secondary | ICD-10-CM | POA: Diagnosis present

## 2019-02-01 DIAGNOSIS — F1420 Cocaine dependence, uncomplicated: Secondary | ICD-10-CM | POA: Diagnosis present

## 2019-02-01 DIAGNOSIS — F419 Anxiety disorder, unspecified: Secondary | ICD-10-CM | POA: Diagnosis present

## 2019-02-01 DIAGNOSIS — Z79899 Other long term (current) drug therapy: Secondary | ICD-10-CM

## 2019-02-01 DIAGNOSIS — J810 Acute pulmonary edema: Secondary | ICD-10-CM

## 2019-02-01 DIAGNOSIS — F200 Paranoid schizophrenia: Secondary | ICD-10-CM | POA: Diagnosis present

## 2019-02-01 DIAGNOSIS — I5043 Acute on chronic combined systolic (congestive) and diastolic (congestive) heart failure: Secondary | ICD-10-CM | POA: Diagnosis present

## 2019-02-01 DIAGNOSIS — E1140 Type 2 diabetes mellitus with diabetic neuropathy, unspecified: Secondary | ICD-10-CM | POA: Diagnosis present

## 2019-02-01 DIAGNOSIS — F17210 Nicotine dependence, cigarettes, uncomplicated: Secondary | ICD-10-CM | POA: Diagnosis present

## 2019-02-01 DIAGNOSIS — Z20828 Contact with and (suspected) exposure to other viral communicable diseases: Secondary | ICD-10-CM | POA: Diagnosis present

## 2019-02-01 DIAGNOSIS — Z5900 Homelessness unspecified: Secondary | ICD-10-CM

## 2019-02-01 LAB — BASIC METABOLIC PANEL
Anion gap: 11 (ref 5–15)
BUN: 19 mg/dL (ref 6–20)
CO2: 29 mmol/L (ref 22–32)
Calcium: 8.7 mg/dL — ABNORMAL LOW (ref 8.9–10.3)
Chloride: 98 mmol/L (ref 98–111)
Creatinine, Ser: 1.03 mg/dL (ref 0.61–1.24)
GFR calc Af Amer: >60 mL/min (ref >60)
GFR calc non Af Amer: >60 mL/min (ref >60)
Glucose, Bld: 260 mg/dL — ABNORMAL HIGH (ref 70–99)
Potassium: 3.2 mmol/L — ABNORMAL LOW (ref 3.5–5.1)
Sodium: 138 mmol/L (ref 135–145)

## 2019-02-01 LAB — CBC
HCT: 30.2 % — ABNORMAL LOW (ref 39.0–52.0)
Hemoglobin: 8.6 g/dL — ABNORMAL LOW (ref 13.0–17.0)
MCH: 22.1 pg — ABNORMAL LOW (ref 26.0–34.0)
MCHC: 28.5 g/dL — ABNORMAL LOW (ref 30.0–36.0)
MCV: 77.6 fL — ABNORMAL LOW (ref 80.0–100.0)
Platelets: 253 10*3/uL (ref 150–400)
RBC: 3.89 MIL/uL — ABNORMAL LOW (ref 4.22–5.81)
RDW: 21.7 % — ABNORMAL HIGH (ref 11.5–15.5)
WBC: 9.3 10*3/uL (ref 4.0–10.5)
nRBC: 0.3 % — ABNORMAL HIGH (ref 0.0–0.2)

## 2019-02-01 MED ORDER — SODIUM CHLORIDE 0.9% FLUSH
3.0000 mL | Freq: Once | INTRAVENOUS | Status: AC
  Administered 2019-02-02: 3 mL via INTRAVENOUS

## 2019-02-01 NOTE — Discharge Summary (Signed)
[facility]  [name] ZOX:[id number] DOB: 03-27-1961 DOA: 01/29/2019  PCP: Patient, No Pcp Per  Admit date: 01/29/2019 Discharge date: 02/01/2019  Admitted From: Home Disposition: Left AMA   History of present illness:  Taylor LinerFrederick L Whitsettis a 58 y.o.malewith medical history significant ofchronic combined systolic and diastolic congestive heart failure with EF 25 to 30%, insulin-dependent type 2 diabetes mellitus, schizophrenia, homelessness, sleep apnea, cocaine abuse, noncomplianceto medications with frequent admissions, often leaving AMA, nowpresenting to the hospital for evaluation of scrotal and bilateral lower extremity edema.Patient reports having edema in his legs and scrotum for the past 1 year. States this has been an ongoing problem and worse now. Reports having shortness of breath. States he coughs only when smoking cigarettes. States he is not able to take any medications as he lives on the streets. States the only time he gets medications is when he is admitted to the hospital. He is requesting to be admitted. States he wants to hurt himself because he lives on the streets and is tired of the life that he is living. He was agitated and no additional history could be obtained from him.  ED Course:Tachycardic and tachypneic. Systolic in the 160s. SPO2 88% on room air and placed on 2 L supplemental oxygen. No leukocytosis. UDS pending. Blood ethanol level negative. Salicylate level negative. Acetaminophen level negative. UA pending. BNP 1357, chronically elevated. High-sensitivity troponin pending. COVID-19 rapid test pending. Chest x-ray showing cardiomegaly with mild volume overload. Patient received IV Lasix 40 mg and Tylenol in the ED.  Hospital course:  Acute hypoxic respiratory failure Acute on chronic combined systolic/diastolic CHF exacerbation Patient presenting with worsening lower extremity/scrotal edema associated  with shortness of breath.  History of medication noncompliance and leaving AGAINST MEDICAL ADVICE; also complicated by homelessness.  BNP elevated to 1357 on admission.  COVID-19 test negative.  Chest x-ray with cardiomegaly and mild volume overload. TTE 11/07/2018 with EF 25-30%, moderate concentric LVH, diastolic dysfunction, severely reduced systolic function RV, diffuse hypokinesis LV, RVSP 51.2 mmHg.  Patient was started on aggressive diuresis with furosemide 40 mg IV 3 times daily.  Patient with significant diuresis and was net -3.7 L during hospitalization with reduction in weight from 245 to 240 pounds.  He was fluid restricted, but continued to be noncompliant as he was aggressively verbally abusive to nursing staff requesting crackers and drinks in order for them to perform lab draws and medication administration.  Unfortunately, patient left AGAINST MEDICAL ADVICE; which she does fairly regularly.  Reasonable efforts were made to advise the patient of the benefit of staying for evaluation as well as treatment. The patient had the decision-making capacity to refuse treatment at this time, per evaluation by psychiatry. We discussed the associated risks of leaving the hospital prior to completion of workup and treatment in the patient voiced understanding. We discussed alternatives to current care plan over patient still voiced his decision to refuse treatment.   Polysubstance abuse Patient with history of tobacco, alcohol and cocaine abuse.  UDS this admission continues to be positive for cocaine.  Alcohol level less than 10.  Discussed with patient need for total cessation. Patient was placed on CIWA protocol with supported with thiamine, folic acid and multivitamin.  Patient left AMA.  Type 2 diabetes mellitus Hemoglobin A1c 9.7 on 12/04/2018.  On glipizide 2.5mg  PO BID at home, but likely noncompliant.  Schizophrenia / Depression Evaluated by psychiatry, Dr. Sharma [name] on 8/1.  No need for  inpatient psychiatry.  Was  evaluated for capacity on 12/08/2018 and was determined to have capacity make decisions regarding placement/medical decisions.    Patient was continued on his home Tegretol and Abilify.  Psychiatry recommends outpatient follow-up with rehabilitation.   This patient has a high bounce back potential given his multiple AMA discharges and noncompliance with outpatient medical therapy coupled with his homelessness and continued polysubstance abuse especially with cocaine in the setting of severe chronic combined systolic and diastolic congestive heart failure.  Would not be surprised if patient returns to the ED over the next several days for treatment as he shown in the past.  Discharge Diagnoses:  Principal Problem:   Cocaine abuse with cocaine-induced mood disorder (HCC) Active Problems:   Suicidal ideation   Homelessness   Acute respiratory failure with hypoxia (HCC)   Acute exacerbation of CHF (congestive heart failure) (HCC)   Scrotal edema    Discharge Instructions   Allergies as of 01/31/2019      Reactions   Haldol [haloperidol] Other (See Comments)   Muscle spasms, loss of voluntary movement. However, pt has taken Thorazine on multiple occasions with no adverse effects.       Medication List    ASK your doctor about these medications   acetaminophen 500 MG tablet Commonly known as: TYLENOL Take 1 tablet (500 mg total) by mouth every 6 (six) hours as needed.   ARIPiprazole 10 MG tablet Commonly known as: Abilify Take 1 tablet (10 mg total) by mouth daily.   carbamazepine 200 MG tablet Commonly known as: TEGRETOL Take 0.5 tablets (100 mg total) by mouth 2 (two) times daily.   furosemide 40 MG tablet Commonly known as: LASIX Take 1 tablet (40 mg total) by mouth 2 (two) times daily.   glipiZIDE 5 MG tablet Commonly known as: GLUCOTROL Take 0.5 tablets (2.5 mg total) by mouth 2 (two) times daily before a meal.   Ipratropium-Albuterol 20-100  MCG/ACT Aers respimat Commonly known as: COMBIVENT Inhale 1 puff into the lungs every 6 (six) hours.   lisinopril 10 MG tablet Commonly known as: ZESTRIL Take 1 tablet (10 mg total) by mouth daily.   potassium chloride SA 20 MEQ tablet Commonly known as: K-DUR Take 1 tablet (20 mEq total) by mouth 2 (two) times daily.       Allergies  Allergen Reactions  . Haldol [Haloperidol] Other (See Comments)    Muscle spasms, loss of voluntary movement. However, pt has taken Thorazine on multiple occasions with no adverse effects.     Consultations:  None   Procedures/Studies: Dg Chest Portable 1 View  Result Date: 01/30/2019 CLINICAL DATA:  Shortness of breath EXAM: PORTABLE CHEST 1 VIEW COMPARISON:  January 25, 2019 FINDINGS: There is cardiomegaly with mild volume overload. There is chronic atelectasis versus scarring in the lingula. There may be small bilateral pleural effusions. There is no pneumothorax. Advanced degenerative changes are noted of the right shoulder. IMPRESSION: Cardiomegaly with mild volume overload. Electronically Signed   By: Katherine Mantlehristopher  Green M.D.   On: 01/30/2019 01:37   Dg Chest Port 1 View  Result Date: 01/25/2019 CLINICAL DATA:  Shortness of breath. Chest pain. EXAM: PORTABLE CHEST 1 VIEW COMPARISON:  01/22/2019, 01/19/2019 and 01/16/2019 FINDINGS: Chronic cardiomegaly. Pulmonary vascularity is normal. No infiltrates. Slight haziness at the lung bases may represent tiny effusions. No acute bone abnormality. IMPRESSION: Possible tiny effusions. Chronic cardiomegaly. Electronically Signed   By: Francene BoyersJames  Maxwell M.D.   On: 01/25/2019 11:48   Dg Chest Portable 1 View  Result  Date: 01/22/2019 CLINICAL DATA:  Shortness of breath and chest pain for 1 year EXAM: PORTABLE CHEST 1 VIEW COMPARISON:  01/19/2019 FINDINGS: Cardiac shadow is enlarged but stable. Aortic calcifications are again seen. Mild vascular congestion is noted but improved when compared with the prior exam.  Small left pleural effusion is noted as well as left midlung atelectatic changes. IMPRESSION: Small left pleural effusion and stable left midlung atelectasis. Vascular congestion but improved when compared with the prior study. Electronically Signed   By: Alcide Clever M.D.   On: 01/22/2019 17:00   Dg Chest Portable 1 View  Result Date: 01/19/2019 CLINICAL DATA:  Chest pain and shortness of breath. EXAM: PORTABLE CHEST 1 VIEW COMPARISON:  Single-view of the chest 01/16/2019, 01/15/2019 and 11/17/2018. FINDINGS: There is cardiomegaly. Pulmonary edema is new since the most recent examination. Small bilateral pleural effusions are present, larger on the left. Atelectasis in the lingula and left lung base noted. No pneumothorax. Aortic atherosclerosis. No acute or focal bony abnormality. IMPRESSION: Cardiomegaly and pulmonary edema with associated small pleural effusions, greater on the left. Electronically Signed   By: Drusilla Kanner M.D.   On: 01/19/2019 07:55   Dg Chest Portable 1 View  Result Date: 01/16/2019 CLINICAL DATA:  LEFT knee pain and chest pain. EXAM: PORTABLE CHEST 1 VIEW COMPARISON:  Chest x-ray dated 01/15/2019 and 01/14/2019. FINDINGS: Stable cardiomegaly. Mild central pulmonary vascular congestion. No confluent opacity to suggest a developing pneumonia. No pleural effusion or pneumothorax seen. Osseous structures about the chest are unremarkable. IMPRESSION: Cardiomegaly with mild central pulmonary vascular congestion suggesting mild CHF/volume overload. No evidence of pneumonia or overt alveolar pulmonary edema. Electronically Signed   By: Bary Richard M.D.   On: 01/16/2019 04:20   Dg Chest Portable 1 View  Result Date: 01/15/2019 CLINICAL DATA:  Low-grade fever which shortness-of-breath. EXAM: PORTABLE CHEST 1 VIEW COMPARISON:  01/14/2019 and 11/13/2018 FINDINGS: Lungs are adequately inflated with linear atelectasis/scarring over the left midlung. No airspace consolidation or effusion.  Stable cardiomegaly. Remainder of the exam is unchanged. IMPRESSION: No acute cardiopulmonary disease. Stable cardiomegaly. Linear atelectasis/scarring left midlung. Electronically Signed   By: Elberta Fortis M.D.   On: 01/15/2019 15:45   Dg Chest Portable 1 View  Result Date: 01/14/2019 CLINICAL DATA:  Edema.  Heart failure. EXAM: PORTABLE CHEST 1 VIEW COMPARISON:  01/13/2019 and multiple previous FINDINGS: Chronic cardiomegaly. Pulmonary venous hypertension with mild interstitial edema. No infiltrate, collapse or effusion. No acute bone finding. Metallic foreign object in the right anterior chest. IMPRESSION: Cardiomegaly, pulmonary venous hypertension and mild interstitial edema. Electronically Signed   By: Paulina Fusi M.D.   On: 01/14/2019 14:30   Dg Chest Portable 1 View  Result Date: 01/13/2019 CLINICAL DATA:  Shortness of breath.  Possible fluid overload EXAM: PORTABLE CHEST 1 VIEW COMPARISON:  01/02/2019 FINDINGS: Chronic cardiomegaly. There is no edema, consolidation, effusion, or pneumothorax. Mild scarring in the left mid chest. IMPRESSION: No acute finding.  Baseline appearance of the chest. Electronically Signed   By: [name]   On: 01/13/2019 10:40   [place] Ascites (abdomen Limited)  Result Date: 01/03/2019 CLINICAL DATA:  Abdominal distension. EXAM: LIMITED ABDOMEN ULTRASOUND FOR ASCITES TECHNIQUE: Limited ultrasound survey for ascites was performed in all four abdominal quadrants. COMPARISON:  None. FINDINGS: Mild amount of fluid is noted in the left lower quadrant and right upper quadrant. Mild-to-moderate fluid is noted in the right lower quadrant. Probable mild fluid is noted in left upper quadrant. IMPRESSION: Mild to  moderate ascites is noted. Electronically Signed   By: Lupita Raider M.D.   On: 01/03/2019 09:39     Discharge Exam: Vitals:   01/31/19 1539 01/31/19 1600  BP: 135/90 (!) 149/97  Pulse: 99 96  Resp:  20  Temp:  97.6 F (36.4 C)  SpO2:  96%    Vitals:   01/31/19 1111 01/31/19 1233 01/31/19 1539 01/31/19 1600  BP: (!) 148/84 134/77 135/90 (!) 149/97  Pulse: 100 99 99 96  Resp: 20 19  20   Temp: 98.1 F (36.7 C) 98.3 F (36.8 C)  97.6 F (36.4 C)  TempSrc: Oral Oral  Oral  SpO2: 100% 95%  96%  Weight:      Height:        General exam: Mild agitation Respiratory system: Breath sounds decreased bilateral bases with crackles, normal respiratory effort, continues on 4 L nasal cannula Cardiovascular system: S1 & S2 heard, RRR. No JVD, murmurs, rubs, gallops or clicks. No pedal edema. Gastrointestinal system: Abdomen is nondistended, soft and nontender. No organomegaly or masses felt. Normal bowel sounds heard. Central nervous system: Alert and oriented. No focal neurological deficits. Extremities: Symmetric 5 x 5 power.  Edema bilateral lower extremities extending up to scrotum/abdomen. Skin: No rashes, lesions or ulcers Psychiatry: Mild agitation, flat affect, depressed mood    The results of significant diagnostics from this hospitalization (including imaging, microbiology, ancillary and laboratory) are listed below for reference.     Microbiology: Recent Results (from the past 240 hour(s))  SARS Coronavirus 2 (CEPHEID - Performed in [facility] hospital lab), Hosp Order     Status: None   Collection Time: 01/23/19  7:57 PM   Specimen: Nasopharyngeal Swab  Result Value Ref Range Status   SARS Coronavirus 2 NEGATIVE NEGATIVE Final    Comment: (NOTE) If result is NEGATIVE SARS-CoV-2 target nucleic acids are NOT DETECTED. The SARS-CoV-2 RNA is generally detectable in upper and lower  respiratory specimens during the acute phase of infection. The lowest  concentration of SARS-CoV-2 viral copies this assay can detect is 250  copies / mL. A negative result does not preclude SARS-CoV-2 infection  and should not be used as the sole basis for treatment or other  patient management decisions.  A negative result may occur  with  improper specimen collection / handling, submission of specimen other  than nasopharyngeal swab, presence of viral mutation(s) within the  areas targeted by this assay, and inadequate number of viral copies  (<250 copies / mL). A negative result must be combined with clinical  observations, patient history, and epidemiological information. If result is POSITIVE SARS-CoV-2 target nucleic acids are DETECTED. The SARS-CoV-2 RNA is generally detectable in upper and lower  respiratory specimens dur ing the acute phase of infection.  Positive  results are indicative of active infection with SARS-CoV-2.  Clinical  correlation with patient history and other diagnostic information is  necessary to determine patient infection status.  Positive results do  not rule out bacterial infection or co-infection with other viruses. If result is PRESUMPTIVE POSTIVE SARS-CoV-2 nucleic acids MAY BE PRESENT.   A presumptive positive result was obtained on the submitted specimen  and confirmed on repeat testing.  While 2019 novel coronavirus  (SARS-CoV-2) nucleic acids may be present in the submitted sample  additional confirmatory testing may be necessary for epidemiological  and / or clinical management purposes  to differentiate between  SARS-CoV-2 and other Sarbecovirus currently known to infect humans.  If clinically indicated  additional testing with an alternate test  methodology [phone] is advised. The SARS-CoV-2 RNA is generally  detectable in upper and lower respiratory sp ecimens during the acute  phase of infection. The expected result is Negative. Fact Sheet for Patients:  BoilerBrush.com.cyhttps://www.fda.gov/media/136312/download Fact Sheet for Healthcare Providers: https://pope.com/https://www.fda.gov/media/136313/download This test is not yet approved or cleared by the [place] FDA and has been authorized for detection and/or diagnosis of SARS-CoV-2 by FDA under an Emergency Use Authorization (EUA).  This EUA  will remain in effect (meaning this test can be used) for the duration of the COVID-19 declaration under Section 564(b)(1) of the Act, 21 U.S.C. section 360bbb-3(b)(1), unless the authorization is terminated or revoked sooner. Performed at [facility], [address] [place], [place] [address]   SARS Coronavirus 2 (CEPHEID - Performed in [facility] hospital lab), Hosp Order     Status: None   Collection Time: 01/25/19 11:40 AM   Specimen: Nasopharyngeal Swab  Result Value Ref Range Status   SARS Coronavirus 2 NEGATIVE NEGATIVE Final    Comment: (NOTE) If result is NEGATIVE SARS-CoV-2 target nucleic acids are NOT DETECTED. The SARS-CoV-2 RNA is generally detectable in upper and lower  respiratory specimens during the acute phase of infection. The lowest  concentration of SARS-CoV-2 viral copies this assay can detect is 250  copies / mL. A negative result does not preclude SARS-CoV-2 infection  and should not be used as the sole basis for treatment or other  patient management decisions.  A negative result may occur with  improper specimen collection / handling, submission of specimen other  than nasopharyngeal swab, presence of viral mutation(s) within the  areas targeted by this assay, and inadequate number of viral copies  (<250 copies / mL). A negative result must be combined with clinical  observations, patient history, and epidemiological information. If result is POSITIVE SARS-CoV-2 target nucleic acids are DETECTED. The SARS-CoV-2 RNA is generally detectable in upper and lower  respiratory specimens dur ing the acute phase of infection.  Positive  results are indicative of active infection with SARS-CoV-2.  Clinical  correlation with patient history and other diagnostic information is  necessary to determine patient infection status.  Positive results do  not rule out bacterial infection or co-infection with other viruses. If result is PRESUMPTIVE  POSTIVE SARS-CoV-2 nucleic acids MAY BE PRESENT.   A presumptive positive result was obtained on the submitted specimen  and confirmed on repeat testing.  While 2019 novel coronavirus  (SARS-CoV-2) nucleic acids may be present in the submitted sample  additional confirmatory testing may be necessary for epidemiological  and / or clinical management purposes  to differentiate between  SARS-CoV-2 and other Sarbecovirus currently known to infect humans.  If clinically indicated additional testing with an alternate test  methodology [phone] is advised. The SARS-CoV-2 RNA is generally  detectable in upper and lower respiratory sp ecimens during the acute  phase of infection. The expected result is Negative. Fact Sheet for Patients:  BoilerBrush.com.cyhttps://www.fda.gov/media/136312/download Fact Sheet for Healthcare Providers: https://pope.com/https://www.fda.gov/media/136313/download This test is not yet approved or cleared by the [place] FDA and has been authorized for detection and/or diagnosis of SARS-CoV-2 by FDA under an Emergency Use Authorization (EUA).  This EUA will remain in effect (meaning this test can be used) for the duration of the COVID-19 declaration under Section 564(b)(1) of the Act, 21 U.S.C. section 360bbb-3(b)(1), unless the authorization is terminated or revoked sooner. Performed at [facility], [address]  [place], [place] [address]   SARS CORONAVIRUS 2 Nasal Swab Aptima Multi Swab     Status: None   Collection Time: 01/30/19  2:04 AM   Specimen: Aptima Multi Swab; Nasal Swab  Result Value Ref Range Status   SARS Coronavirus 2 NEGATIVE NEGATIVE Final    Comment: (NOTE) SARS-CoV-2 target nucleic acids are NOT DETECTED. The SARS-CoV-2 RNA is generally detectable in upper and lower respiratory specimens during the acute phase of infection. Negative results do not preclude SARS-CoV-2 infection, do not rule out co-infections with other pathogens, and should not be used as  the sole basis for treatment or other patient management decisions. Negative results must be combined with clinical observations, patient history, and epidemiological information. The expected result is Negative. Fact Sheet for Patients: HairSlick.no Fact Sheet for Healthcare Providers: quierodirigir.com This test is not yet approved or cleared by the [place] FDA and  has been authorized for detection and/or diagnosis of SARS-CoV-2 by FDA under an Emergency Use Authorization (EUA). This EUA will remain  in effect (meaning this test can be used) for the duration of the COVID-19 declaration under Section 56 4(b)(1) of the Act, 21 U.S.C. section 360bbb-3(b)(1), unless the authorization is terminated or revoked sooner. Performed at [facility], [address] [place], [place] [address]      Labs: BNP (last 3 results) Recent Labs    01/22/19 1707 01/25/19 1122 01/30/19 0131  BNP 1,100.6* 866.7* 1,357.3*   Basic Metabolic Panel: Recent Labs  Lab 01/26/19 1117 01/29/19 0853 01/29/19 2313 01/31/19 0404  NA 140 138 139 141  K 3.9 3.5 3.5 3.6  CL 106 104 105 102  CO2 24 26 25 29   GLUCOSE 181* 215* 197* 200*  BUN 18 13 13 15   CREATININE 1.04 0.99 0.98 0.87  CALCIUM 8.6* 8.6* 8.6* 8.5*  MG  --   --   --  2.0   Liver Function Tests: Recent Labs  Lab 01/29/19 0853 01/29/19 2313  AST 22 23  ALT 11 12  ALKPHOS 171* 173*  BILITOT 0.8 0.8  PROT 6.8 7.1  ALBUMIN 2.6* 2.6*   No results for input(s): LIPASE, AMYLASE in the last 168 hours. No results for input(s): AMMONIA in the last 168 hours. CBC: Recent Labs  Lab 01/26/19 1117 01/29/19 0853 01/29/19 2313  WBC 7.6 8.3 8.1  HGB 8.3* 8.6* 8.4*  HCT 28.8* 30.7* 30.0*  MCV 75.4* 77.5* 76.3*  PLT 343 294 294   Cardiac Enzymes: No results for input(s): CKTOTAL, CKMB, CKMBINDEX, TROPONINI in the last 168 hours. BNP: Invalid input(s):  POCBNP CBG: Recent Labs  Lab 01/30/19 1827 01/30/19 2127 01/31/19 0732 01/31/19 1107 01/31/19 1602  GLUCAP 213* 167* 188* 213* 172*   D-Dimer No results for input(s): DDIMER in the last 72 hours. Hgb A1c No results for input(s): HGBA1C in the last 72 hours. Lipid Profile No results for input(s): CHOL, HDL, LDLCALC, TRIG, CHOLHDL, LDLDIRECT in the last 72 hours. Thyroid function studies No results for input(s): TSH, T4TOTAL, T3FREE, THYROIDAB in the last 72 hours.  Invalid input(s): FREET3 Anemia work up No results for input(s): VITAMINB12, FOLATE, FERRITIN, TIBC, IRON, RETICCTPCT in the last 72 hours. Urinalysis    Component Value Date/Time   COLORURINE YELLOW 01/30/2019 0320   APPEARANCEUR CLEAR 01/30/2019 0320   LABSPEC 1.019 01/30/2019 0320   PHURINE 5.0 01/30/2019 0320   GLUCOSEU NEGATIVE 01/30/2019 0320   HGBUR NEGATIVE 01/30/2019 0320   BILIRUBINUR NEGATIVE 01/30/2019 0320   KETONESUR NEGATIVE  01/30/2019 0320   PROTEINUR 100 (A) 01/30/2019 0320   UROBILINOGEN 1.0 03/14/2015 0610   NITRITE NEGATIVE 01/30/2019 0320   LEUKOCYTESUR NEGATIVE 01/30/2019 0320   Sepsis Labs Invalid input(s): PROCALCITONIN,  WBC,  LACTICIDVEN Microbiology Recent Results (from the past 240 hour(s))  SARS Coronavirus 2 (CEPHEID - Performed in [place] hospital lab), Hosp Order     Status: None   Collection Time: 01/23/19  7:57 PM   Specimen: Nasopharyngeal Swab  Result Value Ref Range Status   SARS Coronavirus 2 NEGATIVE NEGATIVE Final    Comment: (NOTE) If result is NEGATIVE SARS-CoV-2 target nucleic acids are NOT DETECTED. The SARS-CoV-2 RNA is generally detectable in upper and lower  respiratory specimens during the acute phase of infection. The lowest  concentration of SARS-CoV-2 viral copies this assay can detect is 250  copies / mL. A negative result does not preclude SARS-CoV-2 infection  and should not be used as the sole basis for treatment or other  patient management  decisions.  A negative result may occur with  improper specimen collection / handling, submission of specimen other  than nasopharyngeal swab, presence of viral mutation(s) within the  areas targeted by this assay, and inadequate number of viral copies  (<250 copies / mL). A negative result must be combined with clinical  observations, patient history, and epidemiological information. If result is POSITIVE SARS-CoV-2 target nucleic acids are DETECTED. The SARS-CoV-2 RNA is generally detectable in upper and lower  respiratory specimens dur ing the acute phase of infection.  Positive  results are indicative of active infection with SARS-CoV-2.  Clinical  correlation with patient history and other diagnostic information is  necessary to determine patient infection status.  Positive results do  not rule out bacterial infection or co-infection with other viruses. If result is PRESUMPTIVE POSTIVE SARS-CoV-2 nucleic acids MAY BE PRESENT.   A presumptive positive result was obtained on the submitted specimen  and confirmed on repeat testing.  While 2019 novel coronavirus  (SARS-CoV-2) nucleic acids may be present in the submitted sample  additional confirmatory testing may be necessary for epidemiological  and / or clinical management purposes  to differentiate between  SARS-CoV-2 and other Sarbecovirus currently known to infect humans.  If clinically indicated additional testing with an alternate test  methodology [phone]) is advised. The SARS-CoV-2 RNA is generally  detectable in upper and lower respiratory sp ecimens during the acute  phase of infection. The expected result is Negative. Fact Sheet for Patients:  StrictlyIdeas.no Fact Sheet for Healthcare Providers: BankingDealers.co.za This test is not yet approved or cleared by the [place] FDA and has been authorized for detection and/or diagnosis of SARS-CoV-2 by FDA under an  Emergency Use Authorization (EUA).  This EUA will remain in effect (meaning this test can be used) for the duration of the COVID-19 declaration under Section 564(b)(1) of the Act, 21 U.S.C. section 360bbb-3(b)(1), unless the authorization is terminated or revoked sooner. Performed at [facility], [place] [address] [place], [place] [address]   SARS Coronavirus 2 (CEPHEID - Performed in [place] hospital lab), Hosp Order     Status: None   Collection Time: 01/25/19 11:40 AM   Specimen: Nasopharyngeal Swab  Result Value Ref Range Status   SARS Coronavirus 2 NEGATIVE NEGATIVE Final    Comment: (NOTE) If result is NEGATIVE SARS-CoV-2 target nucleic acids are NOT DETECTED. The SARS-CoV-2 RNA is generally detectable in upper and lower  respiratory specimens during the acute phase of infection.  The lowest  concentration of SARS-CoV-2 viral copies this assay can detect is 250  copies / mL. A negative result does not preclude SARS-CoV-2 infection  and should not be used as the sole basis for treatment or other  patient management decisions.  A negative result may occur with  improper specimen collection / handling, submission of specimen other  than nasopharyngeal swab, presence of viral mutation(s) within the  areas targeted by this assay, and inadequate number of viral copies  (<250 copies / mL). A negative result must be combined with clinical  observations, patient history, and epidemiological information. If result is POSITIVE SARS-CoV-2 target nucleic acids are DETECTED. The SARS-CoV-2 RNA is generally detectable in upper and lower  respiratory specimens dur ing the acute phase of infection.  Positive  results are indicative of active infection with SARS-CoV-2.  Clinical  correlation with patient history and other diagnostic information is  necessary to determine patient infection status.  Positive results do  not rule out bacterial infection or co-infection with  other viruses. If result is PRESUMPTIVE POSTIVE SARS-CoV-2 nucleic acids MAY BE PRESENT.   A presumptive positive result was obtained on the submitted specimen  and confirmed on repeat testing.  While 2019 novel coronavirus  (SARS-CoV-2) nucleic acids may be present in the submitted sample  additional confirmatory testing may be necessary for epidemiological  and / or clinical management purposes  to differentiate between  SARS-CoV-2 and other Sarbecovirus currently known to infect humans.  If clinically indicated additional testing with an alternate test  methodology [phone]) is advised. The SARS-CoV-2 RNA is generally  detectable in upper and lower respiratory sp ecimens during the acute  phase of infection. The expected result is Negative. Fact Sheet for Patients:  BoilerBrush.com.cy Fact Sheet for Healthcare Providers: https://pope.com/ This test is not yet approved or cleared by the [place] FDA and has been authorized for detection and/or diagnosis of SARS-CoV-2 by FDA under an Emergency Use Authorization (EUA).  This EUA will remain in effect (meaning this test can be used) for the duration of the COVID-19 declaration under Section 564(b)(1) of the Act, 21 U.S.C. section 360bbb-3(b)(1), unless the authorization is terminated or revoked sooner. Performed at [facility], [address], [place], [place] [address]   SARS CORONAVIRUS 2 Nasal Swab Aptima Multi Swab     Status: None   Collection Time: 01/30/19  2:04 AM   Specimen: Aptima Multi Swab; Nasal Swab  Result Value Ref Range Status   SARS Coronavirus 2 NEGATIVE NEGATIVE Final    Comment: (NOTE) SARS-CoV-2 target nucleic acids are NOT DETECTED. The SARS-CoV-2 RNA is generally detectable in upper and lower respiratory specimens during the acute phase of infection. Negative results do not preclude SARS-CoV-2 infection, do not rule out co-infections with other  pathogens, and should not be used as the sole basis for treatment or other patient management decisions. Negative results must be combined with clinical observations, patient history, and epidemiological information. The expected result is Negative. Fact Sheet for Patients: HairSlick.no Fact Sheet for Healthcare Providers: quierodirigir.com This test is not yet approved or cleared by the [place] FDA and  has been authorized for detection and/or diagnosis of SARS-CoV-2 by FDA under an Emergency Use Authorization (EUA). This EUA will remain  in effect (meaning this test can be used) for the duration of the COVID-19 declaration under Section 56 4(b)(1) of the Act, 21 U.S.C. section 360bbb-3(b)(1), unless the authorization is terminated or revoked sooner. Performed at [facility], [address], [place], [place] [address]      Time coordinating discharge: Over 30 minutes  SIGNED:   Alvira Philips [place], DO  [facility] 02/01/2019, 4:12 PM

## 2019-02-01 NOTE — ED Triage Notes (Signed)
Pt has hx chf. Reports swelling to his groin and testicles, hx of same and noncompliant with meds. No distress noted on arrival.

## 2019-02-02 ENCOUNTER — Encounter (HOSPITAL_COMMUNITY): Payer: Self-pay | Admitting: General Practice

## 2019-02-02 DIAGNOSIS — R601 Generalized edema: Secondary | ICD-10-CM

## 2019-02-02 DIAGNOSIS — Z9114 Patient's other noncompliance with medication regimen: Secondary | ICD-10-CM | POA: Diagnosis not present

## 2019-02-02 DIAGNOSIS — Z20828 Contact with and (suspected) exposure to other viral communicable diseases: Secondary | ICD-10-CM | POA: Diagnosis present

## 2019-02-02 DIAGNOSIS — F419 Anxiety disorder, unspecified: Secondary | ICD-10-CM | POA: Diagnosis present

## 2019-02-02 DIAGNOSIS — F200 Paranoid schizophrenia: Secondary | ICD-10-CM | POA: Diagnosis present

## 2019-02-02 DIAGNOSIS — J810 Acute pulmonary edema: Secondary | ICD-10-CM

## 2019-02-02 DIAGNOSIS — Z888 Allergy status to other drugs, medicaments and biological substances status: Secondary | ICD-10-CM | POA: Diagnosis not present

## 2019-02-02 DIAGNOSIS — Z590 Homelessness: Secondary | ICD-10-CM | POA: Diagnosis not present

## 2019-02-02 DIAGNOSIS — Z5321 Procedure and treatment not carried out due to patient leaving prior to being seen by health care provider: Secondary | ICD-10-CM | POA: Diagnosis not present

## 2019-02-02 DIAGNOSIS — E119 Type 2 diabetes mellitus without complications: Secondary | ICD-10-CM

## 2019-02-02 DIAGNOSIS — F1420 Cocaine dependence, uncomplicated: Secondary | ICD-10-CM | POA: Diagnosis present

## 2019-02-02 DIAGNOSIS — J9601 Acute respiratory failure with hypoxia: Secondary | ICD-10-CM | POA: Diagnosis present

## 2019-02-02 DIAGNOSIS — F17210 Nicotine dependence, cigarettes, uncomplicated: Secondary | ICD-10-CM | POA: Diagnosis present

## 2019-02-02 DIAGNOSIS — I5023 Acute on chronic systolic (congestive) heart failure: Secondary | ICD-10-CM

## 2019-02-02 DIAGNOSIS — I700 Atherosclerosis of aorta: Secondary | ICD-10-CM | POA: Diagnosis present

## 2019-02-02 DIAGNOSIS — N5089 Other specified disorders of the male genital organs: Secondary | ICD-10-CM | POA: Diagnosis present

## 2019-02-02 DIAGNOSIS — G4730 Sleep apnea, unspecified: Secondary | ICD-10-CM | POA: Diagnosis present

## 2019-02-02 DIAGNOSIS — F329 Major depressive disorder, single episode, unspecified: Secondary | ICD-10-CM | POA: Diagnosis present

## 2019-02-02 DIAGNOSIS — I5043 Acute on chronic combined systolic (congestive) and diastolic (congestive) heart failure: Secondary | ICD-10-CM | POA: Diagnosis present

## 2019-02-02 DIAGNOSIS — Z8249 Family history of ischemic heart disease and other diseases of the circulatory system: Secondary | ICD-10-CM | POA: Diagnosis not present

## 2019-02-02 DIAGNOSIS — Z9119 Patient's noncompliance with other medical treatment and regimen: Secondary | ICD-10-CM | POA: Diagnosis not present

## 2019-02-02 DIAGNOSIS — E1140 Type 2 diabetes mellitus with diabetic neuropathy, unspecified: Secondary | ICD-10-CM | POA: Diagnosis present

## 2019-02-02 DIAGNOSIS — Z79899 Other long term (current) drug therapy: Secondary | ICD-10-CM | POA: Diagnosis not present

## 2019-02-02 DIAGNOSIS — Z7984 Long term (current) use of oral hypoglycemic drugs: Secondary | ICD-10-CM | POA: Diagnosis not present

## 2019-02-02 DIAGNOSIS — Z833 Family history of diabetes mellitus: Secondary | ICD-10-CM | POA: Diagnosis not present

## 2019-02-02 DIAGNOSIS — I110 Hypertensive heart disease with heart failure: Secondary | ICD-10-CM | POA: Diagnosis present

## 2019-02-02 LAB — CBG MONITORING, ED: Glucose-Capillary: 248 mg/dL — ABNORMAL HIGH (ref 70–99)

## 2019-02-02 LAB — BRAIN NATRIURETIC PEPTIDE: B Natriuretic Peptide: 959.8 pg/mL — ABNORMAL HIGH (ref 0.0–100.0)

## 2019-02-02 LAB — SARS CORONAVIRUS 2 BY RT PCR (HOSPITAL ORDER, PERFORMED IN ~~LOC~~ HOSPITAL LAB): SARS Coronavirus 2: NEGATIVE

## 2019-02-02 LAB — GLUCOSE, CAPILLARY: Glucose-Capillary: 213 mg/dL — ABNORMAL HIGH (ref 70–99)

## 2019-02-02 MED ORDER — FUROSEMIDE 10 MG/ML IJ SOLN
40.0000 mg | Freq: Three times a day (TID) | INTRAMUSCULAR | Status: DC
  Administered 2019-02-02 (×2): 40 mg via INTRAVENOUS
  Filled 2019-02-02 (×2): qty 4

## 2019-02-02 MED ORDER — ONDANSETRON HCL 4 MG/2ML IJ SOLN: 4.0000 mg | Freq: Four times a day (QID) | INTRAMUSCULAR | Status: DC | PRN

## 2019-02-02 MED ORDER — SODIUM CHLORIDE 0.9 % IV SOLN: 250.0000 mL | INTRAVENOUS | Status: DC | PRN

## 2019-02-02 MED ORDER — POTASSIUM CHLORIDE CRYS ER 20 MEQ PO TBCR
20.0000 meq | EXTENDED_RELEASE_TABLET | Freq: Two times a day (BID) | ORAL | Status: DC
  Administered 2019-02-02: 20 meq via ORAL
  Filled 2019-02-02: qty 1

## 2019-02-02 MED ORDER — FUROSEMIDE 10 MG/ML IJ SOLN
40.0000 mg | Freq: Once | INTRAMUSCULAR | Status: AC
  Administered 2019-02-02: 40 mg via INTRAVENOUS
  Filled 2019-02-02: qty 4

## 2019-02-02 MED ORDER — ENOXAPARIN SODIUM 40 MG/0.4ML ~~LOC~~ SOLN
40.0000 mg | SUBCUTANEOUS | Status: DC
  Administered 2019-02-02: 10:00:00 40 mg via SUBCUTANEOUS
  Filled 2019-02-02 (×2): qty 0.4

## 2019-02-02 MED ORDER — SODIUM CHLORIDE 0.9% FLUSH
3.0000 mL | Freq: Two times a day (BID) | INTRAVENOUS | Status: DC
  Administered 2019-02-02: 3 mL via INTRAVENOUS

## 2019-02-02 MED ORDER — ACETAMINOPHEN 325 MG PO TABS
650.0000 mg | ORAL_TABLET | ORAL | Status: DC | PRN
  Administered 2019-02-02 (×2): 650 mg via ORAL
  Filled 2019-02-02 (×2): qty 2

## 2019-02-02 MED ORDER — GLIPIZIDE 2.5 MG HALF TABLET: 2.5000 mg | ORAL_TABLET | Freq: Two times a day (BID) | ORAL | Status: DC

## 2019-02-02 MED ORDER — SODIUM CHLORIDE 0.9% FLUSH: 3.0000 mL | INTRAVENOUS | Status: DC | PRN

## 2019-02-02 MED ORDER — ACETAMINOPHEN 500 MG PO TABS
1000.0000 mg | ORAL_TABLET | Freq: Once | ORAL | Status: AC
  Administered 2019-02-02: 1000 mg via ORAL
  Filled 2019-02-02: qty 2

## 2019-02-02 MED ORDER — POTASSIUM CHLORIDE CRYS ER 20 MEQ PO TBCR
40.0000 meq | EXTENDED_RELEASE_TABLET | Freq: Once | ORAL | Status: AC
  Administered 2019-02-02: 40 meq via ORAL
  Filled 2019-02-02: qty 2

## 2019-02-02 MED ORDER — INSULIN ASPART 100 UNIT/ML ~~LOC~~ SOLN
0.0000 [IU] | Freq: Three times a day (TID) | SUBCUTANEOUS | Status: DC
  Administered 2019-02-02 (×2): 5 [IU] via SUBCUTANEOUS

## 2019-02-02 MED ORDER — LISINOPRIL 10 MG PO TABS
10.0000 mg | ORAL_TABLET | Freq: Every day | ORAL | Status: DC
  Administered 2019-02-02: 10 mg via ORAL
  Filled 2019-02-02: qty 1

## 2019-02-02 MED ORDER — ALBUTEROL SULFATE HFA 108 (90 BASE) MCG/ACT IN AERS
8.0000 | INHALATION_SPRAY | Freq: Once | RESPIRATORY_TRACT | Status: AC
  Administered 2019-02-02: 8 via RESPIRATORY_TRACT
  Filled 2019-02-02: qty 6.7

## 2019-02-02 NOTE — ED Notes (Signed)
Condom cath placed on pt 

## 2019-02-02 NOTE — ED Provider Notes (Signed)
[facility]   CSN: [id number] Arrival date & time: 02/01/19  2039     History   Chief Complaint Chief Complaint  Patient presents with   Groin Swelling    HPI Mellody MemosFrederick L Blondin is a 58 y.o. male.     The history is provided by the patient.  Illness Location:  BLE into the groin with PND and orthopnea Quality:  Painful edema of the scrotum Severity:  Moderate Onset quality:  Gradual Timing:  Constant Chronicity:  Recurrent Context:  Medication non compliance secondary to homelessness Relieved by:  Nothing    Past Medical History:  Diagnosis Date   Chronic foot pain    Cocaine abuse (HCC)    Diabetes mellitus without complication (HCC)    Hepatitis C    unsure    Homelessness    Hypertension    Neuropathy    Polysubstance abuse (HCC)    Schizophrenia (HCC)    Sleep apnea    Systolic and diastolic CHF, chronic (HCC)     Patient Active Problem List   Diagnosis Date Noted   Scrotal edema 01/30/2019   Combined systolic and diastolic heart failure (HCC) 01/19/2019   Chest pain 01/16/2019   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   Medically noncompliant    Acute on chronic systolic CHF (congestive heart failure) (HCC) 01/02/2019   Acute on chronic combined systolic (congestive) and diastolic (congestive) heart failure (HCC) 01/02/2019   Acute on chronic systolic heart failure (HCC) 12/26/2018   Mobitz type II atrioventricular block 12/25/2018   Acute exacerbation of CHF (congestive heart failure) (HCC) 12/22/2018   Acute on chronic respiratory failure with hypoxia (HCC) 12/22/2018   CHF exacerbation (HCC) 12/19/2018   Acute CHF (congestive heart failure) (HCC) 12/16/2018   Diabetes mellitus without complication (HCC) 12/11/2018   Acute on chronic systolic (congestive) heart failure (HCC) 12/11/2018   Abdominal pain 12/11/2018   Nausea vomiting and diarrhea 12/11/2018   Microcytic anemia  12/11/2018   Evaluation by psychiatric service required    MDD (major depressive disorder), severe (HCC) 11/25/2018   Pressure injury of skin 11/08/2018   Elevated troponin 10/18/2018   HLD (hyperlipidemia) 10/18/2018   Anxiety 10/18/2018   CHF (congestive heart failure), NYHA class II, acute on chronic, combined (HCC) 10/18/2018   Hypertensive urgency 10/12/2018   Mild renal insufficiency 10/12/2018   Cellulitis 10/12/2018   Penile cellulitis    Adjustment disorder with mixed disturbance of emotions and conduct    Sleep apnea 10/03/2018   Hypokalemia 09/17/2018   Scrotal swelling 09/17/2018   Urinary hesitancy    Constipation    Acute on chronic combined systolic and diastolic CHF (congestive heart failure) (HCC) 08/25/2018   Acute respiratory failure with hypoxia (HCC) 08/25/2018   Tobacco use 08/18/2018   Homelessness 08/08/2018   Smoker 08/08/2018   Prostate enlargement 03/16/2018   Aortic atherosclerosis (HCC) 03/16/2018   Aneurysm of abdominal aorta (HCC) 03/16/2018   Cocaine abuse with cocaine-induced mood disorder (HCC) 09/18/2017   Chronic foot pain    Schizoaffective disorder, bipolar type (HCC) 09/30/2016   Substance induced mood disorder (HCC) 03/13/2015   Schizophrenia, paranoid type (HCC) 01/17/2015   Suicidal ideation    Drug hallucinosis (HCC) 10/08/2014   Chronic paranoid schizophrenia (HCC) 09/07/2014   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/10/2014   Urinary retention    Cocaine use disorder, severe, dependence (HCC)    Essential hypertension 03/28/2013   Insulin-requiring or  dependent type II diabetes mellitus (HCC) 03/15/2013    Past Surgical History:  Procedure Laterality Date   MULTIPLE TOOTH EXTRACTIONS          Home Medications    Prior to Admission medications   Medication Sig Start Date End Date Taking? Authorizing Provider  acetaminophen  (TYLENOL) 500 MG tablet Take 1 tablet (500 mg total) by mouth every 6 (six) hours as needed. Patient not taking: Reported on 01/19/2019 01/08/19   Fayrene Helperran, Bow[place], PA-C  ARIPiprazole (ABILIFY) 10 MG tablet Take 1 tablet (10 mg total) by mouth daily. Patient not taking: Reported on 01/13/2019 12/10/18   Joycelyn DasPokhrel, Laxman, MD  carbamazepine (TEGRETOL) 200 MG tablet Take 0.5 tablets (100 mg total) by mouth 2 (two) times daily. Patient not taking: Reported on 01/15/2019 11/26/18   Mancel BaleWentz, Elliott, MD  furosemide (LASIX) 40 MG tablet Take 1 tablet (40 mg total) by mouth 2 (two) times daily. 01/29/19 02/28/19  Linwood DibblesKnapp, Jon, MD  glipiZIDE (GLUCOTROL) 5 MG tablet Take 0.5 tablets (2.5 mg total) by mouth 2 (two) times daily before a meal. 01/01/19   Rai, Ripudeep K, MD  Ipratropium-Albuterol (COMBIVENT) 20-100 MCG/ACT AERS respimat Inhale 1 puff into the lungs every 6 (six) hours. Patient not taking: Reported on 01/15/2019 11/30/18   Ciana Simmon, MD  lisinopril (ZESTRIL) 10 MG tablet Take 1 tablet (10 mg total) by mouth daily. Patient not taking: Reported on 01/15/2019 11/26/18   Mancel BaleWentz, Elliott, MD  potassium chloride SA (K-DUR) 20 MEQ tablet Take 1 tablet (20 mEq total) by mouth 2 (two) times daily. Patient not taking: Reported on 01/15/2019 11/30/18   Nicanor AlconPalumbo, Adeel Guiffre, MD    Family History Family History  Problem Relation Age of Onset   Hypertension Other    Diabetes Other     Social History Social History   Tobacco Use   Smoking status: Current Every Day Smoker    Packs/day: 1.00    Years: 20.00    Pack years: 20.00    Types: Cigarettes   Smokeless tobacco: Current User  Substance Use Topics   Alcohol use: Yes    Comment: Daily Drinker    Drug use: Yes    Frequency: 7.0 times per week    Types: "Crack" cocaine, Cocaine, Marijuana    Comment: Crack     Allergies   Haldol [haloperidol]   Review of Systems Review of Systems   Physical Exam Updated Vital Signs BP (!) 160/89 (BP Location: Left  Arm)    Pulse 99    Temp 98.3 F (36.8 C) (Oral)    Resp 18    SpO2 93%   Physical Exam   ED Treatments / Results  Labs (all labs ordered are listed, but only abnormal results are displayed) Labs Reviewed  BASIC METABOLIC PANEL - Abnormal; Notable for the following components:      Result Value   Potassium 3.2 (*)    Glucose, Bld 260 (*)    Calcium 8.7 (*)    All other components within normal limits  CBC - Abnormal; Notable for the following components:   RBC 3.89 (*)    Hemoglobin 8.6 (*)    HCT 30.2 (*)    MCV 77.6 (*)    MCH 22.1 (*)    MCHC 28.5 (*)    RDW 21.7 (*)    nRBC 0.3 (*)    All other components within normal limits  SARS CORONAVIRUS 2 (HOSPITAL ORDER, PERFORMED IN [facility])  BRAIN NATRIURETIC PEPTIDE  EKG None  Radiology Dg Chest 2 View  Result Date: 02/01/2019 CLINICAL DATA:  Shortness of breath EXAM: CHEST - 2 VIEW COMPARISON:  01/30/2019 FINDINGS: Small pleural effusions. Cardiomegaly with vascular congestion and mild interstitial edema. Patchy atelectasis left base. Aortic atherosclerosis. No pneumothorax. IMPRESSION: Cardiomegaly with small pleural effusions, vascular congestion and mild interstitial edema. Electronically Signed   By: Donavan Foil M.D.   On: 02/01/2019 21:16    Procedures Procedures (including critical care time)  Medications Ordered in ED Medications  sodium chloride flush (NS) 0.9 % injection 3 mL (has no administration in time range)  furosemide (LASIX) injection 40 mg (has no administration in time range)  acetaminophen (TYLENOL) tablet 1,000 mg (has no administration in time range)  albuterol (VENTOLIN HFA) 108 (90 Base) MCG/ACT inhaler 8 puff (has no administration in time range)      Final Clinical Impressions(s) / ED Diagnoses   Anasarca, with acute pulmonary edema.  O2 sat on room air is 89% will need admission    Ersa Delaney, MD 02/02/19 [id number]

## 2019-02-02 NOTE — Progress Notes (Signed)
Patient requested AMA form and has signed it and left facility. His PIV was removed intact. MD talked with him he would not listen has left facility against medical advice.

## 2019-02-02 NOTE — ED Notes (Signed)
Pt continuously removing . Reminded several times to place back on

## 2019-02-02 NOTE — ED Notes (Signed)
Pt placed on 2L [place] due to desat. Pt was 89% on RA

## 2019-02-02 NOTE — ED Notes (Signed)
ED TO INPATIENT HANDOFF REPORT  ED Nurse Name and Phone #:  Joni Reiningicole, [phone]  S Name/Age/Gender [name] 58 y.o. male Room/Bed: 036C/036C  Code Status   Code Status: Full Code  Home/SNF/Other Home Patient oriented to: self, place, time and situation Is this baseline? Yes   Triage Complete: Triage complete  Chief Complaint Genital Pain/Swelling  Triage Note Pt has hx chf. Reports swelling to his groin and testicles, hx of same and noncompliant with meds. No distress noted on arrival.    Allergies Allergies  Allergen Reactions  . Haldol [Haloperidol] Other (See Comments)    Muscle spasms, loss of voluntary movement. However, pt has taken Thorazine on multiple occasions with no adverse effects.     Level of Care/Admitting Diagnosis ED Disposition    ED Disposition Condition Comment   Admit  [facility] [[id number]]  Level of Care: Telemetry Cardiac [103]  I expect the patient will be discharged within 24 hours: No (not a candidate for 5C-Observation unit)  Covid Evaluation: Asymptomatic Screening Protocol (No Symptoms)  Diagnosis: Acute on chronic systolic CHF (congestive heart failure) [facility](HCC) [[id number]]) [[id number]]  Admitting Physician: Hillary BowGARDNER, JARED M [phone][[id number]]  Attending Physician: Hillary BowGARDNER, JARED M [[id number]]  PT Class (Do Not Modify): Observation [104]  PT Acc Code (Do Not Modify): Observation [10022]       B Medical/Surgery History Past Medical History:  Diagnosis Date  . Chronic foot pain   . Cocaine abuse (HCC)   . Diabetes mellitus without complication (HCC)   . Hepatitis C    unsure   . Homelessness   . Hypertension   . Neuropathy   . Polysubstance abuse (HCC)   . Schizophrenia (HCC)   . Sleep apnea   . Systolic and diastolic CHF, chronic (HCC)    Past Surgical History:  Procedure Laterality Date  . MULTIPLE TOOTH EXTRACTIONS       A IV Location/Drains/Wounds Patient Lines/Drains/Airways Status   Active  Line/Drains/Airways    Name:   Placement date:   Placement time:   Site:   Days:   Peripheral IV 02/02/19 Left Antecubital   02/02/19    0821    Antecubital   less than 1          Intake/Output Last 24 hours  Intake/Output Summary (Last 24 hours) at 02/02/2019 1039 Last data filed at 02/02/2019 0930 Gross per 24 hour  Intake -  Output 1800 ml  Net -1800 ml    Labs/Imaging Results for orders placed or performed during the hospital encounter of 02/01/19 (from the past 48 hour(s))  Basic metabolic panel     Status: Abnormal   Collection Time: 02/01/19  9:21 PM  Result Value Ref Range   Sodium 138 135 - 145 mmol/L   Potassium 3.2 (L) 3.5 - 5.1 mmol/L   Chloride 98 98 - 111 mmol/L   CO2 29 22 - 32 mmol/L   Glucose, Bld 260 (H) 70 - 99 mg/dL   BUN 19 6 - 20 mg/dL   Creatinine, Ser 6.571.03 0.61 - 1.24 mg/dL   Calcium 8.7 (L) 8.9 - 10.3 mg/dL   GFR calc non Af Amer >60 >60 mL/min   GFR calc Af Amer >60 >60 mL/min   Anion gap 11 5 - 15    Comment: Performed at [facility], [address] [place], [place] [address]  CBC     Status: Abnormal   Collection Time: 02/01/19  9:21 PM  Result Value Ref  Range   WBC 9.3 4.0 - 10.5 K/uL   RBC 3.89 (L) 4.22 - 5.81 MIL/uL   Hemoglobin 8.6 (L) 13.0 - 17.0 g/dL    Comment: Reticulocyte Hemoglobin testing may be clinically indicated, consider ordering this additional test [id number]    HCT 30.2 (L) 39.0 - 52.0 %   MCV 77.6 (L) 80.0 - 100.0 fL   MCH 22.1 (L) 26.0 - 34.0 pg   MCHC 28.5 (L) 30.0 - 36.0 g/dL   RDW 21.7 (H) 11.5 - 15.5 %   Platelets 253 150 - 400 K/uL   nRBC 0.3 (H) 0.0 - 0.2 %    Comment: Performed at [facility], [place] [address], [place], [place] [address]  Brain natriuretic peptide     Status: Abnormal   Collection Time: 02/01/19  9:21 PM  Result Value Ref Range   B Natriuretic Peptide 959.8 (H) 0.0 - 100.0 pg/mL    Comment: Performed at [facility] [address], [place], [place] [address]  SARS  Coronavirus 2 [facility] order, Performed in [facility] hospital lab) Nasopharyngeal Nasopharyngeal Swab     Status: None   Collection Time: 02/02/19  2:37 AM   Specimen: Nasopharyngeal Swab  Result Value Ref Range   SARS Coronavirus 2 NEGATIVE NEGATIVE    Comment: (NOTE) If result is NEGATIVE SARS-CoV-2 target nucleic acids are NOT DETECTED. The SARS-CoV-2 RNA is generally detectable in upper and lower  respiratory specimens during the acute phase of infection. The lowest  concentration of SARS-CoV-2 viral copies this assay can detect is 250  copies / mL. A negative result does not preclude SARS-CoV-2 infection  and should not be used as the sole basis for treatment or other  patient management decisions.  A negative result may occur with  improper specimen collection / handling, submission of specimen other  than nasopharyngeal swab, presence of viral mutation(s) within the  areas targeted by this assay, and inadequate number of viral copies  (<250 copies / mL). A negative result must be combined with clinical  observations, patient history, and epidemiological information. If result is POSITIVE SARS-CoV-2 target nucleic acids are DETECTED. The SARS-CoV-2 RNA is generally detectable in upper and lower  respiratory specimens dur ing the acute phase of infection.  Positive  results are indicative of active infection with SARS-CoV-2.  Clinical  correlation with patient history and other diagnostic information is  necessary to determine patient infection status.  Positive results do  not rule out bacterial infection or co-infection with other viruses. If result is PRESUMPTIVE POSTIVE SARS-CoV-2 nucleic acids MAY BE PRESENT.   A presumptive positive result was obtained on the submitted specimen  and confirmed on repeat testing.  While 2019 novel coronavirus  (SARS-CoV-2) nucleic acids may be present in the submitted sample  additional confirmatory testing may be necessary for  epidemiological  and / or clinical management purposes  to differentiate between  SARS-CoV-2 and other Sarbecovirus currently known to infect humans.  If clinically indicated additional testing with an alternate test  methodology [id number]) is advised. The SARS-CoV-2 RNA is generally  detectable in upper and lower respiratory sp ecimens during the acute  phase of infection. The expected result is Negative. Fact Sheet for Patients:  StrictlyIdeas.no Fact Sheet for Healthcare Providers: BankingDealers.co.za This test is not yet approved or cleared by the [place] FDA and has been authorized for detection and/or diagnosis of SARS-CoV-2 by FDA under an Emergency Use Authorization (EUA).  This EUA will remain  in effect (meaning this test can be used) for the duration of the COVID-19 declaration under Section 564(b)(1) of the Act, 21 U.S.C. section 360bbb-3(b)(1), unless the authorization is terminated or revoked sooner. Performed at [facility], [address] [place], [place] [address]   CBG monitoring, ED     Status: Abnormal   Collection Time: 02/02/19  8:06 AM  Result Value Ref Range   Glucose-Capillary 248 (H) 70 - 99 mg/dL   Comment 1 Notify RN    Comment 2 Document in Chart    Dg Chest 2 View  Result Date: 02/01/2019 CLINICAL DATA:  Shortness of breath EXAM: CHEST - 2 VIEW COMPARISON:  01/30/2019 FINDINGS: Small pleural effusions. Cardiomegaly with vascular congestion and mild interstitial edema. Patchy atelectasis left base. Aortic atherosclerosis. No pneumothorax. IMPRESSION: Cardiomegaly with small pleural effusions, vascular congestion and mild interstitial edema. Electronically Signed   By: Jasmine PangKim  Fujinaga M.D.   On: 02/01/2019 21:16    Pending Labs Unresulted Labs (From admission, onward)    Start     Ordered   02/03/19 0500  Basic metabolic panel  Daily,   R     02/02/19 0317   02/03/19 0500  Brain natriuretic  peptide  Daily,   R     02/02/19 0714          Vitals/Pain Today's Vitals   02/02/19 0830 02/02/19 0945 02/02/19 1028 02/02/19 1036  BP: (!) 156/99 (!) 166/99 (!) 166/106   Pulse: 99 95  97  Resp: 20 20  (!) 22  Temp:      TempSrc:      SpO2: 96% 97%  94%  PainSc:        Isolation Precautions No active isolations  Medications Medications  furosemide (LASIX) injection 40 mg (40 mg Intravenous Given 02/02/19 0845)  lisinopril (ZESTRIL) tablet 10 mg (10 mg Oral Given 02/02/19 1028)  potassium chloride SA (K-DUR) CR tablet 20 mEq (20 mEq Oral Given 02/02/19 1029)  sodium chloride flush (NS) 0.9 % injection 3 mL (3 mLs Intravenous Not Given 02/02/19 1035)  sodium chloride flush (NS) 0.9 % injection 3 mL (has no administration in time range)  0.9 %  sodium chloride infusion (has no administration in time range)  acetaminophen (TYLENOL) tablet 650 mg (650 mg Oral Given 02/02/19 0845)  ondansetron (ZOFRAN) injection 4 mg (has no administration in time range)  enoxaparin (LOVENOX) injection 40 mg (40 mg Subcutaneous Given 02/02/19 1029)  insulin aspart (novoLOG) injection 0-15 Units (5 Units Subcutaneous Given 02/02/19 1028)  sodium chloride flush (NS) 0.9 % injection 3 mL (3 mLs Intravenous Given 02/02/19 0500)  furosemide (LASIX) injection 40 mg (40 mg Intravenous Given 02/02/19 0218)  acetaminophen (TYLENOL) tablet 1,000 mg (1,000 mg Oral Given 02/02/19 0211)  albuterol (VENTOLIN HFA) 108 (90 Base) MCG/ACT inhaler 8 puff (8 puffs Inhalation Given 02/02/19 0218)  potassium chloride SA (K-DUR) CR tablet 40 mEq (40 mEq Oral Given 02/02/19 0457)    Mobility walks with person assist Low fall risk   Focused Assessments Pulmonary Assessment Handoff:  Lung sounds: Bilateral Breath Sounds: Diminished L Breath Sounds: Diminished R Breath Sounds: Diminished O2 Device: Nasal Cannula O2 Flow Rate (L/min): 2 L/min      R Recommendations: See Admitting Provider Note  Report given to:   Additional  Notes:

## 2019-02-02 NOTE — Progress Notes (Signed)
Inpatient Diabetes Program Recommendations  AACE/ADA: New Consensus Statement on Inpatient Glycemic Control (2015)  Target Ranges:  Prepandial:   less than 140 mg/dL      Peak postprandial:   less than 180 mg/dL (1-2 hours)      Critically ill patients:  140 - 180 mg/dL   Lab Results  Component Value Date   GLUCAP 213 (H) 02/02/2019   HGBA1C 9.7 (H) 12/04/2018    Review of Glycemic Control  Inpatient Diabetes Program Recommendations:   Noted patient is homeless. DM coordinator spoke with patient on last admission regarding diabetes management and very difficult situation to maintain glycemic control. While in the hospital: -Consider Lantus 10 units daily  Thank you, Nani Gasser. Ronique Simerly, RN, MSN, CDE  Diabetes Coordinator Inpatient Glycemic Control Team Team Pager [phone] (8am-5pm) 02/02/2019 1:38 PM

## 2019-02-02 NOTE — Progress Notes (Signed)
PROGRESS NOTE    Patient: [name]                            PCP: Patient, No Pcp Per                    DOB: 1961/04/22            DOA: 02/01/2019 ZOX:[id number]             DOS: 02/02/2019, 1:29 PM   LOS: 0 days   Date of Service: The patient was seen and examined on 02/02/2019  Subjective:   The patient was seen and examined this morning, he continues to have ongoing edema in her lower extremity, scrotal area with some shortness of breath.  Denies any chest pain. Remains to be afebrile normotensive  Brief Narrative:   Taylor MemosFrederick L Tischer is a 58 y.o. male very well known to our service.  He has h/o CHF with EF 25-30% secondary to ongoing cocaine abuse, is non-adherant to therapy, frequently leaves hospital AMA.  22 out of 24 UDS studies this year positive for cocaine.  Patient was just admitted from 8/1-8/2 for anasarca / peripheral edema.  Put on TID lasix, he left AMA on 8/2.  Since leaving AMA he admits to not taking any lasix (or other meds for that matter) as outpatient.  I didn't ask him about cocaine.  He returns to the ED with ongoing peripheral edema, scrotal swelling, and some SOB.  ED Course: CXR shows mild pulm edema.  BNP 959.    He was started back on IV Lasix. And was admitted for further aggressive diuresing  Assessment & Plan:   Principal Problem:   Acute on chronic systolic CHF (congestive heart failure) (HCC) Active Problems:   Cocaine use disorder, severe, dependence (HCC)   Chronic paranoid schizophrenia (HCC)   Homelessness   Diabetes mellitus without complication (HCC)   Acute hypoxic respiratory failure Acute on chronic combined systolic/diastolic CHF exacerbation -Patient has left AMA 2 days ago, -Noncompliant/homeless - Noncompliant with his medication including Lasix, noting worsening lower extremity edema/scrotal edema associated with shortness of breath  -  BNP elevated to 1357 on last admission >>> 959    COVID-19 test  negative.  Chest x-ray with cardiomegaly and mild volume overload. - TTE 11/07/2018 with EF 25-30%, moderate concentric LVH, diastolic dysfunction, severely reduced systolic function RV, diffuse hypokinesis LV, RVSP 51.2 mmHg. --n --Wt 109.1 kg >> 107.3 kg  -Continue IV Lasix 40 mg twice daily, -Fluid restriction -O2 supplements to maintain O2 sat greater than 90%,  --Strict I's and O's, daily weights  Polysubstance abuse -History of tobacco/alcohol/cocaine abuse - UDS this admission continues to be positive for cocaine.   -22 out of 24 UDS this year alone positive including the one from just 3 days ago  Discussed with patient need for total cessation. --Nicotine patch  --SW for assistance with outpatient resources; given his history likely to be noncompliant  Type 2 diabetes mellitus Hemoglobin A1c 9.7 on 12/04/2018.  On glipizide 2.5mg  PO BID at home, but likely noncompliant. --Holding oral hypoglycemics while inpatient --Consistent carbohydrate diet --Insulin sliding scale for coverage while inpatient  Schizophrenia / Depression Evaluated by psychiatry, Dr. Sharma CovertNorman on 8/1.  No need for inpatient psychiatry.   -Was evaluated for capacity on 12/08/2018 and was determined to have capacity make decisions regarding placement/medical decisions.   On the last admission psychiatry recommends outpatient  follow-up with rehabilitation. --Tegretol 100 mg p.o. twice daily --Abilify 10 mg p.o. daily --Outpatient follow-up    Cultures; None  Antimicrobials: None   DVT prophylaxis: SCD/Compression stockings and Lovenox SQ Code Status:   Code Status: Full Code Family Communication: None The above findings and plan of care has been discussed with patient and family in detail,  they expressed understanding and agreement of above. Disposition Plan:  2-3 days  Admission status:  NPATIEN -patient remained to have shortness of breath extensive lower extremity scrotal edema due to CHF  exacerbation, needing IV Lasix, for aggressive diuresing  Consultants: None  Procedures:   No admission procedures for hospital encounter.     Antimicrobials:  Anti-infectives (From admission, onward)   None       Medication:  . enoxaparin (LOVENOX) injection  40 mg Subcutaneous Q24H  . furosemide  40 mg Intravenous TID AC  . insulin aspart  0-15 Units Subcutaneous TID WC  . lisinopril  10 mg Oral Daily  . potassium chloride SA  20 mEq Oral BID  . sodium chloride flush  3 mL Intravenous Q12H    sodium chloride, acetaminophen, ondansetron (ZOFRAN) IV, sodium chloride flush   Objective:   Vitals:   02/02/19 1028 02/02/19 1036 02/02/19 1045 02/02/19 1135  BP: (!) 166/106  (!) 179/104 (!) 151/94  Pulse:  97 95 95  Resp:  (!) 22 (!) 22 20  Temp:    98.5 F (36.9 C)  TempSrc:    Oral  SpO2:  94% 100% 98%  Weight:    107.3 kg  Height:    5\' 9"  (1.753 m)    Intake/Output Summary (Last 24 hours) at 02/02/2019 1329 Last data filed at 02/02/2019 1227 Gross per 24 hour  Intake 480 ml  Output 2800 ml  Net -2320 ml   Filed Weights   02/02/19 1135  Weight: 107.3 kg     Examination:   Physical Exam  Constitution:  Alert, cooperative, no distress,  Appears calm and comfortable  Psychiatric: Normal and stable mood and affect, cognition intact,   HEENT: Normocephalic, PERRL, otherwise with in Normal limits  Chest:Chest symmetric Cardio vascular:  S1/S2, RRR, No murmure, No Rubs or Gallops  pulmonary: Clear to auscultation bilaterally, respirations unlabored, negative wheezes / crackles Abdomen: Soft, non-tender, non-distended, bowel sounds,no masses, no organomegaly Muscular skeletal: Limited exam - in bed, able to move all 4 extremities, Normal strength,  Neuro: CNII-XII intact. , normal motor and sensation, reflexes intact  Extremities:  +2 bilateral pitting edema extending above the knee to the thigh, positive scrotal edema Skin: Dry, warm to touch, negative for any  Rashes, scrotal edema erythema    LABs:  CBC Latest Ref Rng & Units 02/01/2019 01/29/2019 01/29/2019  WBC 4.0 - 10.5 K/uL 9.3 8.1 8.3  Hemoglobin 13.0 - 17.0 g/dL 8.6(L) 8.4(L) 8.6(L)  Hematocrit 39.0 - 52.0 % 30.2(L) 30.0(L) 30.7(L)  Platelets 150 - 400 K/uL 253 294 294   CMP Latest Ref Rng & Units 02/01/2019 01/31/2019 01/29/2019  Glucose 70 - 99 mg/dL 260(H) 200(H) 197(H)  BUN 6 - 20 mg/dL 19 15 13   Creatinine 0.61 - 1.24 mg/dL 1.03 0.87 0.98  Sodium 135 - 145 mmol/L 138 141 139  Potassium 3.5 - 5.1 mmol/L 3.2(L) 3.6 3.5  Chloride 98 - 111 mmol/L 98 102 105  CO2 22 - 32 mmol/L 29 29 25   Calcium 8.9 - 10.3 mg/dL 8.7(L) 8.5(L) 8.6(L)  Total Protein 6.5 - 8.1 g/dL - - 7.1  Total Bilirubin 0.3 - 1.2 mg/dL - - 0.8  Alkaline Phos 38 - 126 U/L - - 173(H)  AST 15 - 41 U/L - - 23  ALT 0 - 44 U/L - - 12    SIGNED: Kendell BaneSeyed A Roneisha Stern, MD, FACP, FHM. [facility],  Pager [phone]  If 7PM-7AM, please contact night-coverage [email or web address], Password [email or web address] 02/02/2019, 1:29 PM

## 2019-02-02 NOTE — ED Notes (Signed)
F/U Breakfast Tray Ordered @ 1019.

## 2019-02-02 NOTE — H&P (Signed)
History and Physical    [name] ZOX:[id number] DOB: 1960/08/03 DOA: 02/01/2019  PCP: Patient, No Pcp Per  Patient coming from: Homeless  I have personally briefly reviewed [facility]  Chief Complaint: Peripheral edema  HPI: [name] is a 58 y.o. male very well known to our service.  He has h/o CHF with EF 25-30% secondary to ongoing cocaine abuse, is non-adherant to therapy, frequently leaves hospital AMA.  22 out of 24 UDS studies this year positive for cocaine.  Patient was just admitted from 8/1-8/2 for anasarca / peripheral edema.  Put on TID lasix, he left AMA on 8/2.  Since leaving AMA he admits to not taking any lasix (or other meds for that matter) as outpatient.  I didn't ask him about cocaine.  He returns to the ED with ongoing peripheral edema, scrotal swelling, and some SOB.   ED Course: CXR shows mild pulm edema.  BNP 959.  Given 40 of IV lasix and hospitalist asked to readmit.   Review of Systems: As per HPI, otherwise all review of systems negative.  Past Medical History:  Diagnosis Date  . Chronic foot pain   . Cocaine abuse (HCC)   . Diabetes mellitus without complication (HCC)   . Hepatitis C    unsure   . Homelessness   . Hypertension   . Neuropathy   . Polysubstance abuse (HCC)   . Schizophrenia (HCC)   . Sleep apnea   . Systolic and diastolic CHF, chronic (HCC)     Past Surgical History:  Procedure Laterality Date  . MULTIPLE TOOTH EXTRACTIONS       reports that he has been smoking cigarettes. He has a 20.00 pack-year smoking history. He uses smokeless tobacco. He reports current alcohol use. He reports current drug use. Frequency: 7.00 times per week. Drugs: "Crack" cocaine, Cocaine, and Marijuana.  Allergies  Allergen Reactions  . Haldol [Haloperidol] Other (See Comments)    Muscle spasms, loss of voluntary movement. However, pt has taken Thorazine on multiple occasions with no  adverse effects.     Family History  Problem Relation Age of Onset  . Hypertension Other   . Diabetes Other      Prior to Admission medications   Medication Sig Start Date End Date Taking? Authorizing Provider  acetaminophen (TYLENOL) 500 MG tablet Take 1 tablet (500 mg total) by mouth every 6 (six) hours as needed. Patient not taking: Reported on 01/19/2019 01/08/19   Fayrene Helperran, Bowie, PA-C  ARIPiprazole (ABILIFY) 10 MG tablet Take 1 tablet (10 mg total) by mouth daily. Patient not taking: Reported on 01/13/2019 12/10/18   Joycelyn DasPokhrel, Laxman, MD  carbamazepine (TEGRETOL) 200 MG tablet Take 0.5 tablets (100 mg total) by mouth 2 (two) times daily. Patient not taking: Reported on 01/15/2019 11/26/18   Mancel BaleWentz, Elliott, MD  furosemide (LASIX) 40 MG tablet Take 1 tablet (40 mg total) by mouth 2 (two) times daily. 01/29/19 02/28/19  Linwood DibblesKnapp, Jon, MD  glipiZIDE (GLUCOTROL) 5 MG tablet Take 0.5 tablets (2.5 mg total) by mouth 2 (two) times daily before a meal. 01/01/19   Rai, Ripudeep K, MD  Ipratropium-Albuterol (COMBIVENT) 20-100 MCG/ACT AERS respimat Inhale 1 puff into the lungs every 6 (six) hours. Patient not taking: Reported on 01/15/2019 11/30/18   Palumbo, April, MD  lisinopril (ZESTRIL) 10 MG tablet Take 1 tablet (10 mg total) by mouth daily. Patient not taking: Reported on 01/15/2019 11/26/18   Mancel BaleWentz, Elliott, MD  potassium  chloride SA (K-DUR) 20 MEQ tablet Take 1 tablet (20 mEq total) by mouth 2 (two) times daily. Patient not taking: Reported on 01/15/2019 11/30/18   Randal Buba, April, MD    Physical Exam: Vitals:   02/02/19 0212 02/02/19 0215 02/02/19 0230 02/02/19 0300  BP:  (!) 162/104    Pulse: (!) 108 (!) 105  (!) 102  Resp:   (!) 23 (!) 21  Temp:      TempSrc:      SpO2: 90% (!) 86%  96%    Constitutional: NAD, calm, comfortable Eyes: PERRL, lids and conjunctivae normal ENMT: Mucous membranes are moist. Posterior pharynx clear of any exudate or lesions.Normal dentition.  Neck: normal, supple, no  masses, no thyromegaly Respiratory: clear to auscultation bilaterally, no wheezing, no crackles. Normal respiratory effort. No accessory muscle use.  Cardiovascular: Regular rate and rhythm, no murmurs / rubs / gallops. No extremity edema. 2+ pedal pulses. No carotid bruits.  Abdomen: no tenderness, no masses palpated. No hepatosplenomegaly. Bowel sounds positive.  Musculoskeletal: no clubbing / cyanosis. No joint deformity upper and lower extremities. Good ROM, no contractures. Normal muscle tone.  Skin: no rashes, lesions, ulcers. No induration Neurologic: CN 2-12 grossly intact. Sensation intact, DTR normal. Strength 5/5 in all 4.  Psychiatric: Normal judgment and insight. Alert and oriented x 3. Normal mood.    Labs on Admission: I have personally reviewed following labs and imaging studies  CBC: Recent Labs  Lab 01/26/19 1117 01/29/19 0853 01/29/19 2313 02/01/19 2121  WBC 7.6 8.3 8.1 9.3  HGB 8.3* 8.6* 8.4* 8.6*  HCT 28.8* 30.7* 30.0* 30.2*  MCV 75.4* 77.5* 76.3* 77.6*  PLT 343 294 294 240   Basic Metabolic Panel: Recent Labs  Lab 01/26/19 1117 01/29/19 0853 01/29/19 2313 01/31/19 0404 02/01/19 2121  NA 140 138 139 141 138  K 3.9 3.5 3.5 3.6 3.2*  CL 106 104 105 102 98  CO2 24 26 25 29 29   GLUCOSE 181* 215* 197* 200* 260*  BUN 18 13 13 15 19   CREATININE 1.04 0.99 0.98 0.87 1.03  CALCIUM 8.6* 8.6* 8.6* 8.5* 8.7*  MG  --   --   --  2.0  --    GFR: Estimated Creatinine Clearance: 96.4 mL/min (by C-G formula based on SCr of 1.03 mg/dL). Liver Function Tests: Recent Labs  Lab 01/29/19 0853 01/29/19 2313  AST 22 23  ALT 11 12  ALKPHOS 171* 173*  BILITOT 0.8 0.8  PROT 6.8 7.1  ALBUMIN 2.6* 2.6*   No results for input(s): LIPASE, AMYLASE in the last 168 hours. No results for input(s): AMMONIA in the last 168 hours. Coagulation Profile: No results for input(s): INR, PROTIME in the last 168 hours. Cardiac Enzymes: No results for input(s): CKTOTAL, CKMB,  CKMBINDEX, TROPONINI in the last 168 hours. BNP (last 3 results) No results for input(s): PROBNP in the last 8760 hours. HbA1C: No results for input(s): HGBA1C in the last 72 hours. CBG: Recent Labs  Lab 01/30/19 1827 01/30/19 2127 01/31/19 0732 01/31/19 1107 01/31/19 1602  GLUCAP 213* 167* 188* 213* 172*   Lipid Profile: No results for input(s): CHOL, HDL, LDLCALC, TRIG, CHOLHDL, LDLDIRECT in the last 72 hours. Thyroid Function Tests: No results for input(s): TSH, T4TOTAL, FREET4, T3FREE, THYROIDAB in the last 72 hours. Anemia Panel: No results for input(s): VITAMINB12, FOLATE, FERRITIN, TIBC, IRON, RETICCTPCT in the last 72 hours. Urine analysis:    Component Value Date/Time   COLORURINE YELLOW 01/30/2019 0320   APPEARANCEUR CLEAR  01/30/2019 0320   LABSPEC 1.019 01/30/2019 0320   PHURINE 5.0 01/30/2019 0320   GLUCOSEU NEGATIVE 01/30/2019 0320   HGBUR NEGATIVE 01/30/2019 0320   BILIRUBINUR NEGATIVE 01/30/2019 0320   KETONESUR NEGATIVE 01/30/2019 0320   PROTEINUR 100 (A) 01/30/2019 0320   UROBILINOGEN 1.0 03/14/2015 0610   NITRITE NEGATIVE 01/30/2019 0320   LEUKOCYTESUR NEGATIVE 01/30/2019 0320    Radiological Exams on Admission: Dg Chest 2 View  Result Date: 02/01/2019 CLINICAL DATA:  Shortness of breath EXAM: CHEST - 2 VIEW COMPARISON:  01/30/2019 FINDINGS: Small pleural effusions. Cardiomegaly with vascular congestion and mild interstitial edema. Patchy atelectasis left base. Aortic atherosclerosis. No pneumothorax. IMPRESSION: Cardiomegaly with small pleural effusions, vascular congestion and mild interstitial edema. Electronically Signed   By: Jasmine PangKim  Fujinaga M.D.   On: 02/01/2019 21:16    EKG: Independently reviewed.  Assessment/Plan Principal Problem:   Acute on chronic systolic CHF (congestive heart failure) (HCC) Active Problems:   Cocaine use disorder, severe, dependence (HCC)   Chronic paranoid schizophrenia (HCC)   Homelessness   Diabetes mellitus without  complication (HCC)    1. Acute on chronic systolic CHF - 1. Readmitting on CHF pathway 2. Will put back on the lasix TID 3. 40 of K now and 20 BID 4. Daily BMP 5. Strict intake and output 6. Resume home lisinopril 2. DM2 - 1. Hold glipizide 2. Mod scale SSI AC 3. Cocaine abuse - 1. 22 out of 24 UDS this year alone positive including the one from just 3 days ago 2. Will hold off on getting another UDS since history would seem to suggest it probably will be positive for cocaine as well. 3. See where they were discussing a history of alcohol abuse in DC summary; however, as far as I can see, patient never comes in with EtOH on board despite literally hundreds of BAL tests over past couple of years.  DVT prophylaxis: Lovenox Code Status: Full Family Communication: No family in room Disposition Plan: Home after admit (or more likely when he leaves AMA again). Consults called: None Admission status: Place in obs    GARDNER, JARED M. DO [facility]  How to contact the [facility] Attending or Consulting provider 7A - 7P or covering provider during after hours 7P -7A, for this patient?  1. Check the care team in [facility] and look for a) attending/consulting [facility] provider listed and b) the [facility] team listed 2. Log into [email or web address]  Amion Physician Scheduling and messaging for groups and whole hospitals  On call and physician scheduling software for group practices, residents, hospitalists and other medical providers for call, clinic, rotation and shift schedules. On[employer] is a hospital-wide system for scheduling doctors and paging doctors on call. EasyPlot is for scientific plotting and data analysis.  [email or web address]  and use 's universal password to access. If you do not have the password, please contact the hospital operator.  3. Locate the [facility] provider you are looking for under [facility] and page to a number that you can be directly reached. 4. If you still have  difficulty reaching the provider, please page the [facility] (Director on Call) for the Hospitalists listed on amion for assistance.  02/02/2019, 3:19 AM

## 2019-02-02 NOTE — Plan of Care (Signed)

## 2019-02-03 ENCOUNTER — Encounter (HOSPITAL_COMMUNITY): Payer: Self-pay | Admitting: Emergency Medicine

## 2019-02-03 ENCOUNTER — Other Ambulatory Visit: Payer: Self-pay

## 2019-02-03 ENCOUNTER — Emergency Department (HOSPITAL_COMMUNITY)
Admission: EM | Admit: 2019-02-03 | Discharge: 2019-02-03 | Disposition: A | Discharge status: Home / Self Care | Payer: Medicaid Other | Attending: Emergency Medicine | Admitting: Emergency Medicine

## 2019-02-03 DIAGNOSIS — Z7984 Long term (current) use of oral hypoglycemic drugs: Secondary | ICD-10-CM | POA: Diagnosis not present

## 2019-02-03 DIAGNOSIS — N5089 Other specified disorders of the male genital organs: Secondary | ICD-10-CM | POA: Diagnosis present

## 2019-02-03 DIAGNOSIS — R45851 Suicidal ideations: Secondary | ICD-10-CM | POA: Diagnosis not present

## 2019-02-03 DIAGNOSIS — I5042 Chronic combined systolic (congestive) and diastolic (congestive) heart failure: Secondary | ICD-10-CM | POA: Insufficient documentation

## 2019-02-03 DIAGNOSIS — R601 Generalized edema: Secondary | ICD-10-CM | POA: Diagnosis not present

## 2019-02-03 DIAGNOSIS — F17210 Nicotine dependence, cigarettes, uncomplicated: Secondary | ICD-10-CM | POA: Diagnosis not present

## 2019-02-03 DIAGNOSIS — Z9114 Patient's other noncompliance with medication regimen: Secondary | ICD-10-CM | POA: Diagnosis not present

## 2019-02-03 DIAGNOSIS — I110 Hypertensive heart disease with heart failure: Secondary | ICD-10-CM | POA: Diagnosis not present

## 2019-02-03 DIAGNOSIS — E119 Type 2 diabetes mellitus without complications: Secondary | ICD-10-CM | POA: Diagnosis not present

## 2019-02-03 LAB — COMPREHENSIVE METABOLIC PANEL
ALT: 11 U/L (ref 0–44)
AST: 24 U/L (ref 15–41)
Albumin: 2.5 g/dL — ABNORMAL LOW (ref 3.5–5.0)
Alkaline Phosphatase: 169 U/L — ABNORMAL HIGH (ref 38–126)
Anion gap: 10 (ref 5–15)
BUN: 15 mg/dL (ref 6–20)
CO2: 30 mmol/L (ref 22–32)
Calcium: 8.8 mg/dL — ABNORMAL LOW (ref 8.9–10.3)
Chloride: 98 mmol/L (ref 98–111)
Creatinine, Ser: 0.9 mg/dL (ref 0.61–1.24)
GFR calc Af Amer: >60 mL/min (ref >60)
GFR calc non Af Amer: >60 mL/min (ref >60)
Glucose, Bld: 326 mg/dL — ABNORMAL HIGH (ref 70–99)
Potassium: 3.6 mmol/L (ref 3.5–5.1)
Sodium: 138 mmol/L (ref 135–145)
Total Bilirubin: 0.7 mg/dL (ref 0.3–1.2)
Total Protein: 6.8 g/dL (ref 6.5–8.1)

## 2019-02-03 LAB — RAPID URINE DRUG SCREEN, HOSP PERFORMED
Amphetamines: NOT DETECTED
Barbiturates: NOT DETECTED
Benzodiazepines: NOT DETECTED
Cocaine: POSITIVE — AB
Opiates: NOT DETECTED
Tetrahydrocannabinol: NOT DETECTED

## 2019-02-03 LAB — ACETAMINOPHEN LEVEL: Acetaminophen (Tylenol), Serum: <10 ug/mL — ABNORMAL LOW (ref 10–30)

## 2019-02-03 LAB — CBC
HCT: 29.7 % — ABNORMAL LOW (ref 39.0–52.0)
Hemoglobin: 8.5 g/dL — ABNORMAL LOW (ref 13.0–17.0)
MCH: 21.9 pg — ABNORMAL LOW (ref 26.0–34.0)
MCHC: 28.6 g/dL — ABNORMAL LOW (ref 30.0–36.0)
MCV: 76.5 fL — ABNORMAL LOW (ref 80.0–100.0)
Platelets: 235 10*3/uL (ref 150–400)
RBC: 3.88 MIL/uL — ABNORMAL LOW (ref 4.22–5.81)
RDW: 21.7 % — ABNORMAL HIGH (ref 11.5–15.5)
WBC: 8.4 10*3/uL (ref 4.0–10.5)
nRBC: 0 % (ref 0.0–0.2)

## 2019-02-03 LAB — ETHANOL: Alcohol, Ethyl (B): <10 mg/dL (ref <10)

## 2019-02-03 LAB — SALICYLATE LEVEL: Salicylate Lvl: <7 mg/dL (ref 2.8–30.0)
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
## 2019-02-03 MED ORDER — FUROSEMIDE 10 MG/ML IJ SOLN
40.0000 mg | Freq: Once | INTRAMUSCULAR | Status: AC
  Administered 2019-02-03: 40 mg via INTRAMUSCULAR
  Filled 2019-02-03: qty 4

## 2019-02-03 NOTE — ED Notes (Signed)
Patient did not receive Lasix 40 mg IM , he moved his right arm and refused the injection , EDP notified.

## 2019-02-03 NOTE — ED Triage Notes (Signed)
Patient here with feeling suicidal, has swollen abdomen and having pain and swelling in his testicles.  He was admitted yesterday and left AMA around 3pm.  Patient has had some crack this evening.

## 2019-02-03 NOTE — ED Notes (Addendum)
Patient evaluated by EDP , advised RN to give Lasix IM and will discharge patient .

## 2019-02-03 NOTE — ED Provider Notes (Signed)
[facility] EMERGENCY DEPARTMENT Provider Note   CSN: [id number] Arrival date & time: 02/03/19  0330     History   Chief Complaint Chief Complaint  Patient presents with  . Suicidal  . Leg Swelling  . Testicle Pain    HPI [name] is a 58 y.o. male.     HPI  This a 58 year old male with history of cocaine abuse, diabetes, homelessness, chronic CHF, schizophrenia who is well-known to our emergency department who presents with multiple complaints.  Patient reports that he is having continued pain in his testicles and legs.  He relates this to swelling.  He was been seen and evaluated twice this week and has left AMA both times from the hospital after being admitted for diuresis.  He is homeless.  He continues to do crack cocaine.  He denies any shortness of breath or chest pain.  When asked if he was suicidal he states "I am just a sick man."  He has no plan.  He does not take his medications as an outpatient.  Patient reports that he is able to urinate without difficulty.  Past Medical History:  Diagnosis Date  . Chronic foot pain   . Cocaine abuse ([place])   . Diabetes mellitus without complication ([place])   . Hepatitis C    unsure   . Homelessness   . Hypertension   . Neuropathy   . Polysubstance abuse ([place])   . Schizophrenia ([place])   . Sleep apnea   . Systolic and diastolic CHF, chronic [facility])     Patient Active Problem List   Diagnosis Date Noted  . CHF (congestive heart failure), NYHA class III, acute on chronic, combined ([place]) 02/02/2019  . Scrotal edema 01/30/2019  . Combined systolic and diastolic heart failure ([place]) 01/19/2019  . Chest pain 01/16/2019  . 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  . Medically noncompliant   . Acute on chronic systolic CHF (congestive heart failure) ([place]) 01/02/2019  . Acute on chronic combined systolic (congestive) and diastolic (congestive) heart failure ([place]) 01/02/2019  . Acute on chronic systolic heart failure ([place])  12/26/2018  . Mobitz type II atrioventricular block 12/25/2018  . Acute exacerbation of CHF (congestive heart failure) ([place]) 12/22/2018  . Acute on chronic respiratory failure with hypoxia ([place]) 12/22/2018  . CHF exacerbation ([place]) 12/19/2018  . Acute CHF (congestive heart failure) ([place]) 12/16/2018  . Diabetes mellitus without complication ([place]) 82/95/6213  . Acute on chronic systolic (congestive) heart failure ([place]) 12/11/2018  . Abdominal pain 12/11/2018  . Nausea vomiting and diarrhea 12/11/2018  . Microcytic anemia 12/11/2018  . Evaluation by psychiatric service required   . MDD (major depressive disorder), severe ([place]) 11/25/2018  . Pressure injury of skin 11/08/2018  . Elevated troponin 10/18/2018  . HLD (hyperlipidemia) 10/18/2018  . Anxiety 10/18/2018  . CHF (congestive heart failure), NYHA class II, acute on chronic, combined ([place]) 10/18/2018  . Hypertensive urgency 10/12/2018  . Mild renal insufficiency 10/12/2018  . Cellulitis 10/12/2018  . Penile cellulitis   . Adjustment disorder with mixed disturbance of emotions and conduct   . Sleep apnea 10/03/2018  . Hypokalemia 09/17/2018  . Scrotal swelling 09/17/2018  . Urinary hesitancy   . Constipation   . Acute on chronic combined systolic and diastolic CHF (congestive heart failure) ([place]) 08/25/2018  . Acute respiratory failure with hypoxia ([place]) 08/25/2018  . Tobacco use 08/18/2018  . Homelessness 08/08/2018  . Smoker 08/08/2018  . Prostate enlargement 03/16/2018  . Aortic atherosclerosis ([place]) 03/16/2018  .  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 Cocaine abuse with cocaine-induced mood disorder (HCC) 09/18/2017  . Chronic foot pain   . Schizoaffective disorder, bipolar type (HCC) 09/30/2016  . Substance induced mood disorder (HCC) 03/13/2015  . Schizophrenia, paranoid type (HCC) 01/17/2015  . Suicidal ideation   . Drug hallucinosis (HCC) 10/08/2014  . Chronic paranoid schizophrenia (HCC) 09/07/2014  .  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 Urinary retention   . Cocaine use disorder, severe, dependence (HCC)   . Essential hypertension 03/28/2013  . 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 MULTIPLE TOOTH EXTRACTIONS          Home Medications    Prior to Admission medications   Medication Sig Start Date End Date Taking? Authorizing Provider  acetaminophen (TYLENOL) 500 MG tablet Take 1 tablet (500 mg total) by mouth every 6 (six) hours as needed. Patient not taking: Reported on 01/19/2019 01/08/19   Fayrene Helperran, Bowie, PA-C  ARIPiprazole (ABILIFY) 10 MG tablet Take 1 tablet (10 mg total) by mouth daily. Patient not taking: Reported on 01/13/2019 12/10/18   Joycelyn DasPokhrel, Laxman, MD  carbamazepine (TEGRETOL) 200 MG tablet Take 0.5 tablets (100 mg total) by mouth 2 (two) times daily. Patient not taking: Reported on 01/15/2019 11/26/18   Mancel BaleWentz, Elliott, MD  furosemide (LASIX) 40 MG tablet Take 1 tablet (40 mg total) by mouth 2 (two) times daily. 01/29/19 02/28/19  Linwood DibblesKnapp, Jon, MD  glipiZIDE (GLUCOTROL) 5 MG tablet Take 0.5 tablets (2.5 mg total) by mouth 2 (two) times daily before a meal. 01/01/19   Rai, Ripudeep K, MD  Ipratropium-Albuterol (COMBIVENT) 20-100 MCG/ACT AERS respimat Inhale 1 puff into the lungs every 6 (six) hours. Patient not taking: Reported on 01/15/2019 11/30/18   Palumbo, April, MD  lisinopril (ZESTRIL) 10 MG tablet Take 1 tablet (10 mg total) by mouth daily. Patient not taking: Reported on 01/15/2019 11/26/18   Mancel BaleWentz, Elliott, MD  potassium chloride SA (K-DUR) 20 MEQ tablet Take 1 tablet (20 mEq total) by mouth 2 (two) times daily. Patient not taking: Reported on 01/15/2019 11/30/18   Nicanor AlconPalumbo, April, MD    Family History Family History  Problem Relation Age of Onset  . Hypertension Other   . Diabetes Other     Social History  Social History   Tobacco Use  . Smoking status: Current Every Day Smoker    Packs/day: 1.00    Years: 20.00    Pack years: 20.00    Types: Cigarettes  . Smokeless tobacco: Current User  Substance Use Topics  . Alcohol use: Yes    Comment: Daily Drinker   . Drug use: Yes    Frequency: 7.0 times per week    Types: "Crack" cocaine, Cocaine, Marijuana    Comment: Crack     Allergies   Haldol [haloperidol]   Review of Systems Review of Systems  Constitutional: Negative for fever.  Respiratory: Negative for shortness of breath.   Cardiovascular: Positive for leg swelling. Negative for chest pain.  Gastrointestinal: Negative for abdominal pain, nausea and vomiting.  Genitourinary: Positive for scrotal swelling.  Neurological: Negative for headaches.  Psychiatric/Behavioral: Positive for agitation.  All other systems reviewed and are negative.    Physical Exam Updated Vital Signs BP (!) 149/87 (BP Location: Right Arm)   Pulse (!) 102   Temp 98.9 F (37.2 C) (Oral)   Resp 20  SpO2 94%   Physical Exam Vitals signs and nursing note reviewed.  Constitutional:      Appearance: He is well-developed.     Comments: Disheveled appearance, appears at his baseline  HENT:     Head: Normocephalic and atraumatic.     Mouth/Throat:     Mouth: Mucous membranes are moist.  Neck:     Musculoskeletal: Neck supple.  Cardiovascular:     Rate and Rhythm: Normal rate and regular rhythm.     Heart sounds: Normal heart sounds. No murmur.  Pulmonary:     Effort: Pulmonary effort is normal. No respiratory distress.     Breath sounds: Wheezing present.     Comments: Occasional wheeze Abdominal:     General: Bowel sounds are normal. There is distension.     Palpations: Abdomen is soft.     Tenderness: There is no abdominal tenderness. There is no rebound.     Comments: Umbilical hernia  Genitourinary:    Comments: Circumcised penis, diffuse swelling noted of the scrotum and penis,  no crepitus Musculoskeletal:     Right lower leg: Edema present.     Left lower leg: Edema present.     Comments: 3+ bilateral lower extremity edema to the thighs  Skin:    General: Skin is warm and dry.  Neurological:     Mental Status: He is alert and oriented to person, place, and time.      ED Treatments / Results  Labs (all labs ordered are listed, but only abnormal results are displayed) Labs Reviewed  COMPREHENSIVE METABOLIC PANEL - Abnormal; Notable for the following components:      Result Value   Glucose, Bld 326 (*)    Calcium 8.8 (*)    Albumin 2.5 (*)    Alkaline Phosphatase 169 (*)    All other components within normal limits  ACETAMINOPHEN LEVEL - Abnormal; Notable for the following components:   Acetaminophen (Tylenol), Serum <10 (*)    All other components within normal limits  CBC - Abnormal; Notable for the following components:   RBC 3.88 (*)    Hemoglobin 8.5 (*)    HCT 29.7 (*)    MCV 76.5 (*)    MCH 21.9 (*)    MCHC 28.6 (*)    RDW 21.7 (*)    All other components within normal limits  RAPID URINE DRUG SCREEN, HOSP PERFORMED - Abnormal; Notable for the following components:   Cocaine POSITIVE (*)    All other components within normal limits  ETHANOL  SALICYLATE LEVEL    EKG None  Radiology Dg Chest 2 View  Result Date: 02/01/2019 CLINICAL DATA:  Shortness of breath EXAM: CHEST - 2 VIEW COMPARISON:  01/30/2019 FINDINGS: Small pleural effusions. Cardiomegaly with vascular congestion and mild interstitial edema. Patchy atelectasis left base. Aortic atherosclerosis. No pneumothorax. IMPRESSION: Cardiomegaly with small pleural effusions, vascular congestion and mild interstitial edema. Electronically Signed   By: Jasmine PangKim  Fujinaga M.D.   On: 02/01/2019 21:16    Procedures Procedures (including critical care time)  Medications Ordered in ED Medications  furosemide (LASIX) injection 40 mg (40 mg Intramuscular Given 02/03/19 0615)     Initial  Impression / Assessment and Plan / ED Course  I have reviewed the triage vital signs and the nursing notes.  Pertinent labs & imaging results that were available during my care of the patient were reviewed by me and considered in my medical decision making (see chart for details).  Patient presents with passive suicidal ideation and continued swelling of the groin and legs.  He was admitted yesterday approximately 24 hours ago for diuresis but left AGAINST MEDICAL ADVICE.  At that time he did have a chest x-ray that showed some pulmonary edema.  He is not short of breath at this time and his O2 sats are 95%.  He is in no respiratory distress.  His lab work was reviewed and appears close to baseline.  His suicidal ideation reported in triage appears to be passive and related likely to his homelessness and ongoing medical problems.  I know Mr. Mcmurry and have evaluated him multiple times.  His swelling is slightly worse than last time I saw him but he has chronic significant swelling of both the legs and groin.  Given normal vital signs and history of noncompliance and leaving AGAINST MEDICAL ADVICE, I do not feel he has an indication for admission at this time.  I did order a dose of IM Lasix to help with diuresis and have implored him to take his medications as an outpatient.  He is very angry with me and refused the Lasix.  Again, he has been seen over 100 times in the last 6 months and has left AMA from the hospital twice in the last week.  I have discussed with him that he needs to help himself in order for us to help him.  I do not think he would benefit from admission as I feel he would likely leave AGAINST MEDICAL ADVICE again.  He has exhausted social work and case management resources in the past as well.  After history, exam, and medical workup I feel the patient has been appropriately medically screened and is safe for discharge home. Pertinent diagnoses were discussed with the patient.  Patient was given return precautions.   Final Clinical Impressions(s) / ED Diagnoses   Final diagnoses:  Anasarca  Non compliance w medication regimen  Suicidal ideation    ED Discharge Orders    None       Shon BatonHorton, Florian Chauca F, MD 02/03/19 [phone]

## 2019-02-03 NOTE — ED Notes (Signed)
Ordered bfast 

## 2019-02-03 NOTE — Discharge Instructions (Addendum)
You were seen today for ongoing complaints of leg swelling and scrotal swelling.  You were seen multiple times in the last week and admitted and left AGAINST MEDICAL ADVICE.  You were offered a dose of Lasix to help with swelling but you refused.  You have been given multiple resources in the past.  Take your medications at home as prescribed.

## 2019-02-04 ENCOUNTER — Emergency Department (HOSPITAL_COMMUNITY)
Admission: EM | Admit: 2019-02-04 | Discharge: 2019-02-04 | Disposition: A | Discharge status: Home / Self Care | Payer: Medicaid Other | Attending: Emergency Medicine | Admitting: Emergency Medicine

## 2019-02-04 ENCOUNTER — Other Ambulatory Visit: Payer: Self-pay

## 2019-02-04 ENCOUNTER — Emergency Department (HOSPITAL_COMMUNITY): Payer: Medicaid Other

## 2019-02-04 DIAGNOSIS — Z590 Homelessness: Secondary | ICD-10-CM | POA: Insufficient documentation

## 2019-02-04 DIAGNOSIS — R0602 Shortness of breath: Secondary | ICD-10-CM | POA: Diagnosis present

## 2019-02-04 DIAGNOSIS — F17210 Nicotine dependence, cigarettes, uncomplicated: Secondary | ICD-10-CM | POA: Diagnosis not present

## 2019-02-04 DIAGNOSIS — F209 Schizophrenia, unspecified: Secondary | ICD-10-CM | POA: Insufficient documentation

## 2019-02-04 DIAGNOSIS — E119 Type 2 diabetes mellitus without complications: Secondary | ICD-10-CM | POA: Insufficient documentation

## 2019-02-04 DIAGNOSIS — I110 Hypertensive heart disease with heart failure: Secondary | ICD-10-CM | POA: Diagnosis not present

## 2019-02-04 DIAGNOSIS — I5043 Acute on chronic combined systolic (congestive) and diastolic (congestive) heart failure: Secondary | ICD-10-CM

## 2019-02-04 DIAGNOSIS — M79671 Pain in right foot: Secondary | ICD-10-CM | POA: Insufficient documentation

## 2019-02-04 DIAGNOSIS — F1490 Cocaine use, unspecified, uncomplicated: Secondary | ICD-10-CM | POA: Insufficient documentation

## 2019-02-04 DIAGNOSIS — Z7984 Long term (current) use of oral hypoglycemic drugs: Secondary | ICD-10-CM | POA: Insufficient documentation

## 2019-02-04 DIAGNOSIS — M79672 Pain in left foot: Secondary | ICD-10-CM | POA: Insufficient documentation

## 2019-02-04 DIAGNOSIS — Z9119 Patient's noncompliance with other medical treatment and regimen: Secondary | ICD-10-CM

## 2019-02-04 DIAGNOSIS — Z79899 Other long term (current) drug therapy: Secondary | ICD-10-CM | POA: Diagnosis not present

## 2019-02-04 DIAGNOSIS — Z91199 Patient's noncompliance with other medical treatment and regimen due to unspecified reason: Secondary | ICD-10-CM

## 2019-02-04 LAB — CBC WITH DIFFERENTIAL/PLATELET
Abs Immature Granulocytes: 0.02 10*3/uL (ref 0.00–0.07)
Basophils Absolute: 0.1 10*3/uL (ref 0.0–0.1)
Basophils Relative: 1 %
Eosinophils Absolute: 0 10*3/uL (ref 0.0–0.5)
Eosinophils Relative: 0 %
HCT: 31.7 % — ABNORMAL LOW (ref 39.0–52.0)
Hemoglobin: 8.8 g/dL — ABNORMAL LOW (ref 13.0–17.0)
Immature Granulocytes: 0 %
Lymphocytes Relative: 14 %
Lymphs Abs: 0.9 10*3/uL (ref 0.7–4.0)
MCH: 21.6 pg — ABNORMAL LOW (ref 26.0–34.0)
MCHC: 27.8 g/dL — ABNORMAL LOW (ref 30.0–36.0)
MCV: 77.9 fL — ABNORMAL LOW (ref 80.0–100.0)
Monocytes Absolute: 0.8 10*3/uL (ref 0.1–1.0)
Monocytes Relative: 12 %
Neutro Abs: 5 10*3/uL (ref 1.7–7.7)
Neutrophils Relative %: 73 %
Platelets: 228 10*3/uL (ref 150–400)
RBC: 4.07 MIL/uL — ABNORMAL LOW (ref 4.22–5.81)
RDW: 21.8 % — ABNORMAL HIGH (ref 11.5–15.5)
WBC: 6.9 10*3/uL (ref 4.0–10.5)
nRBC: 0 % (ref 0.0–0.2)

## 2019-02-04 LAB — BASIC METABOLIC PANEL
Anion gap: 11 (ref 5–15)
BUN: 14 mg/dL (ref 6–20)
CO2: 28 mmol/L (ref 22–32)
Calcium: 8.9 mg/dL (ref 8.9–10.3)
Chloride: 100 mmol/L (ref 98–111)
Creatinine, Ser: 0.94 mg/dL (ref 0.61–1.24)
GFR calc Af Amer: >60 mL/min (ref >60)
GFR calc non Af Amer: >60 mL/min (ref >60)
Glucose, Bld: 263 mg/dL — ABNORMAL HIGH (ref 70–99)
Potassium: 3.6 mmol/L (ref 3.5–5.1)
Sodium: 139 mmol/L (ref 135–145)

## 2019-02-04 LAB — BRAIN NATRIURETIC PEPTIDE: B Natriuretic Peptide: 958.7 pg/mL — ABNORMAL HIGH (ref 0.0–100.0)

## 2019-02-04 LAB — RAPID URINE DRUG SCREEN, HOSP PERFORMED
Amphetamines: NOT DETECTED
Barbiturates: NOT DETECTED
Benzodiazepines: NOT DETECTED
Cocaine: POSITIVE — AB
Opiates: NOT DETECTED
Tetrahydrocannabinol: NOT DETECTED

## 2019-02-04 IMAGING — CR DG CHEST 2V
2 series · 2 of 2 positions shown · non-contrast
Comparison: 07/15/2018

CLINICAL DATA: Cough and intermittent shortness of breath

EXAM:
CHEST - 2 VIEW

[chest pa]
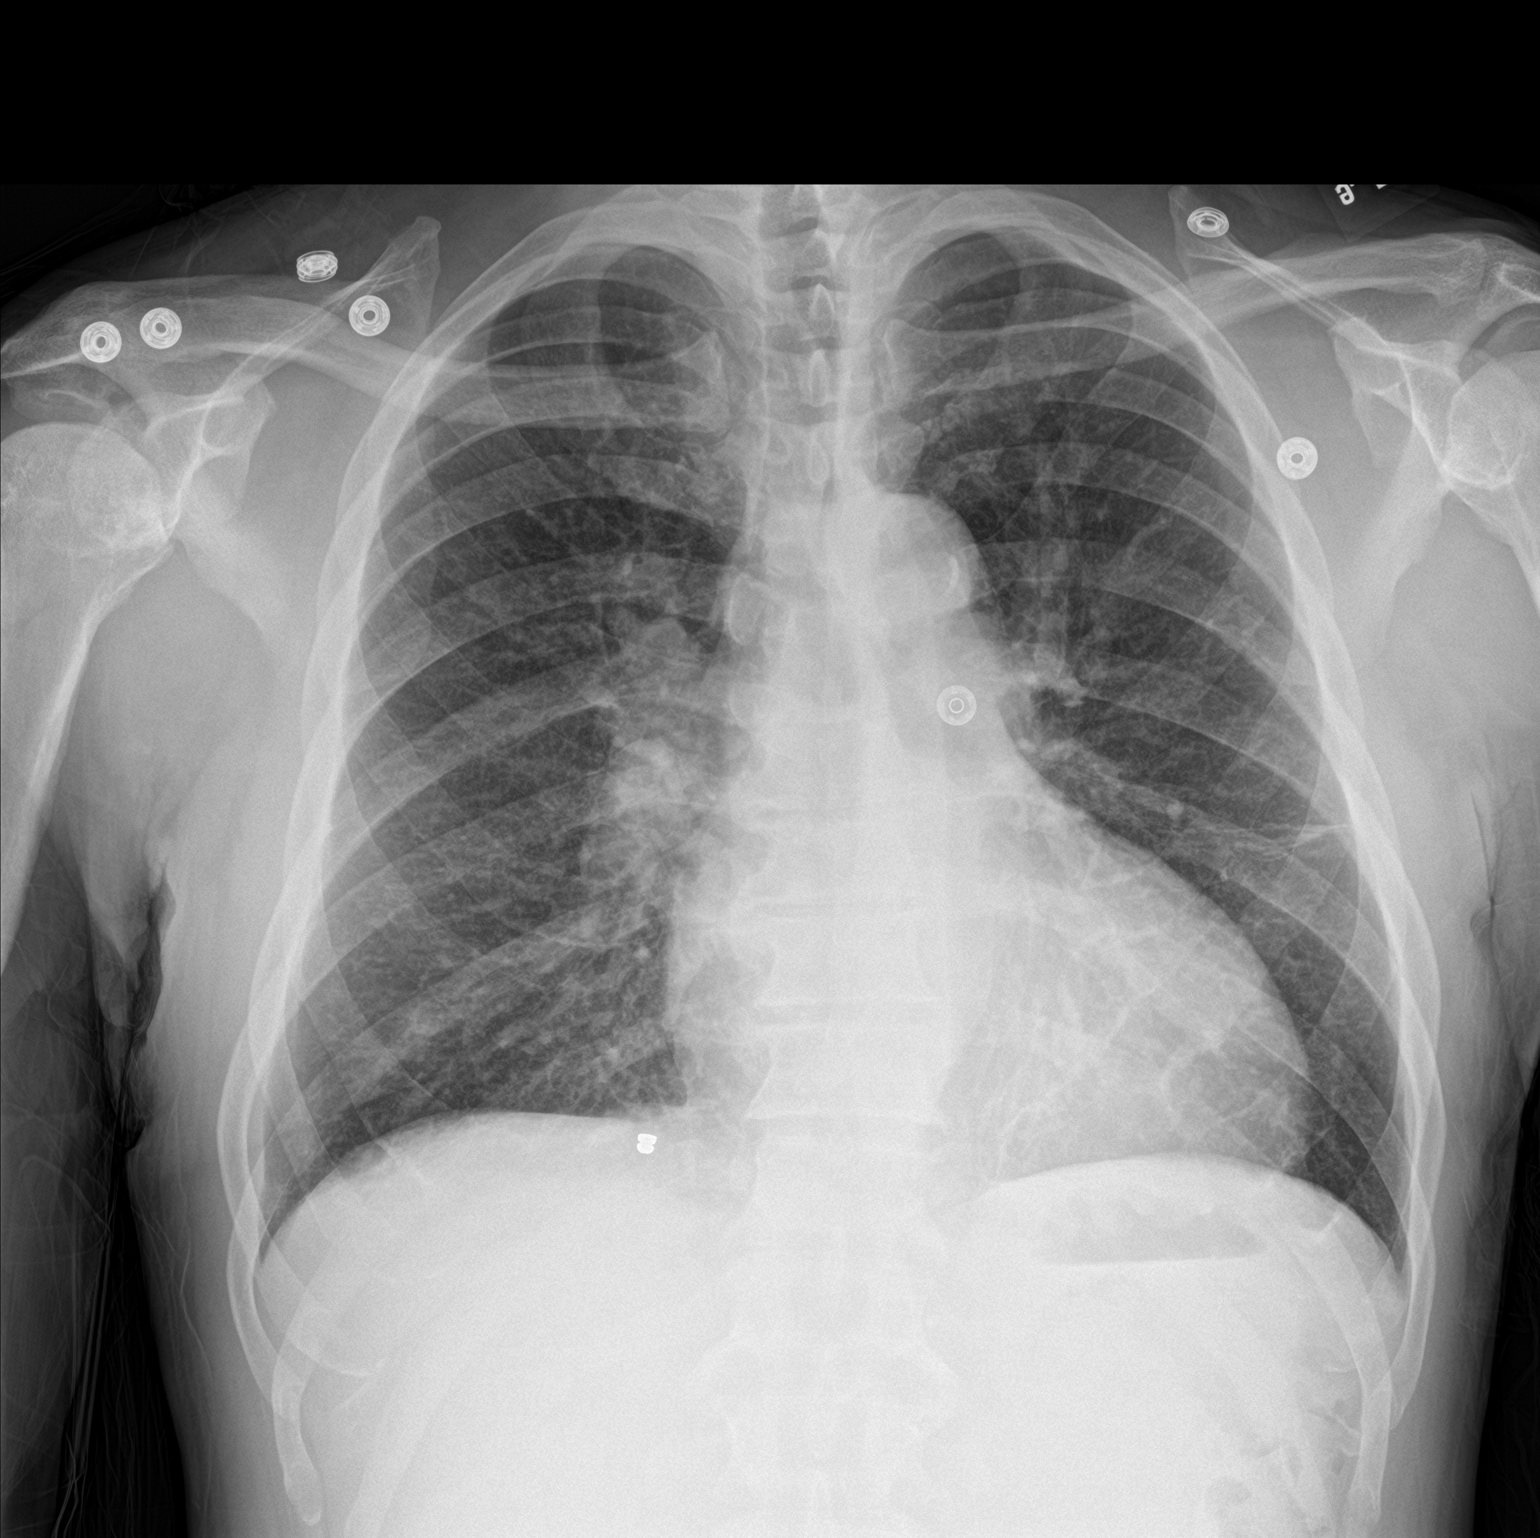

[chest lat]
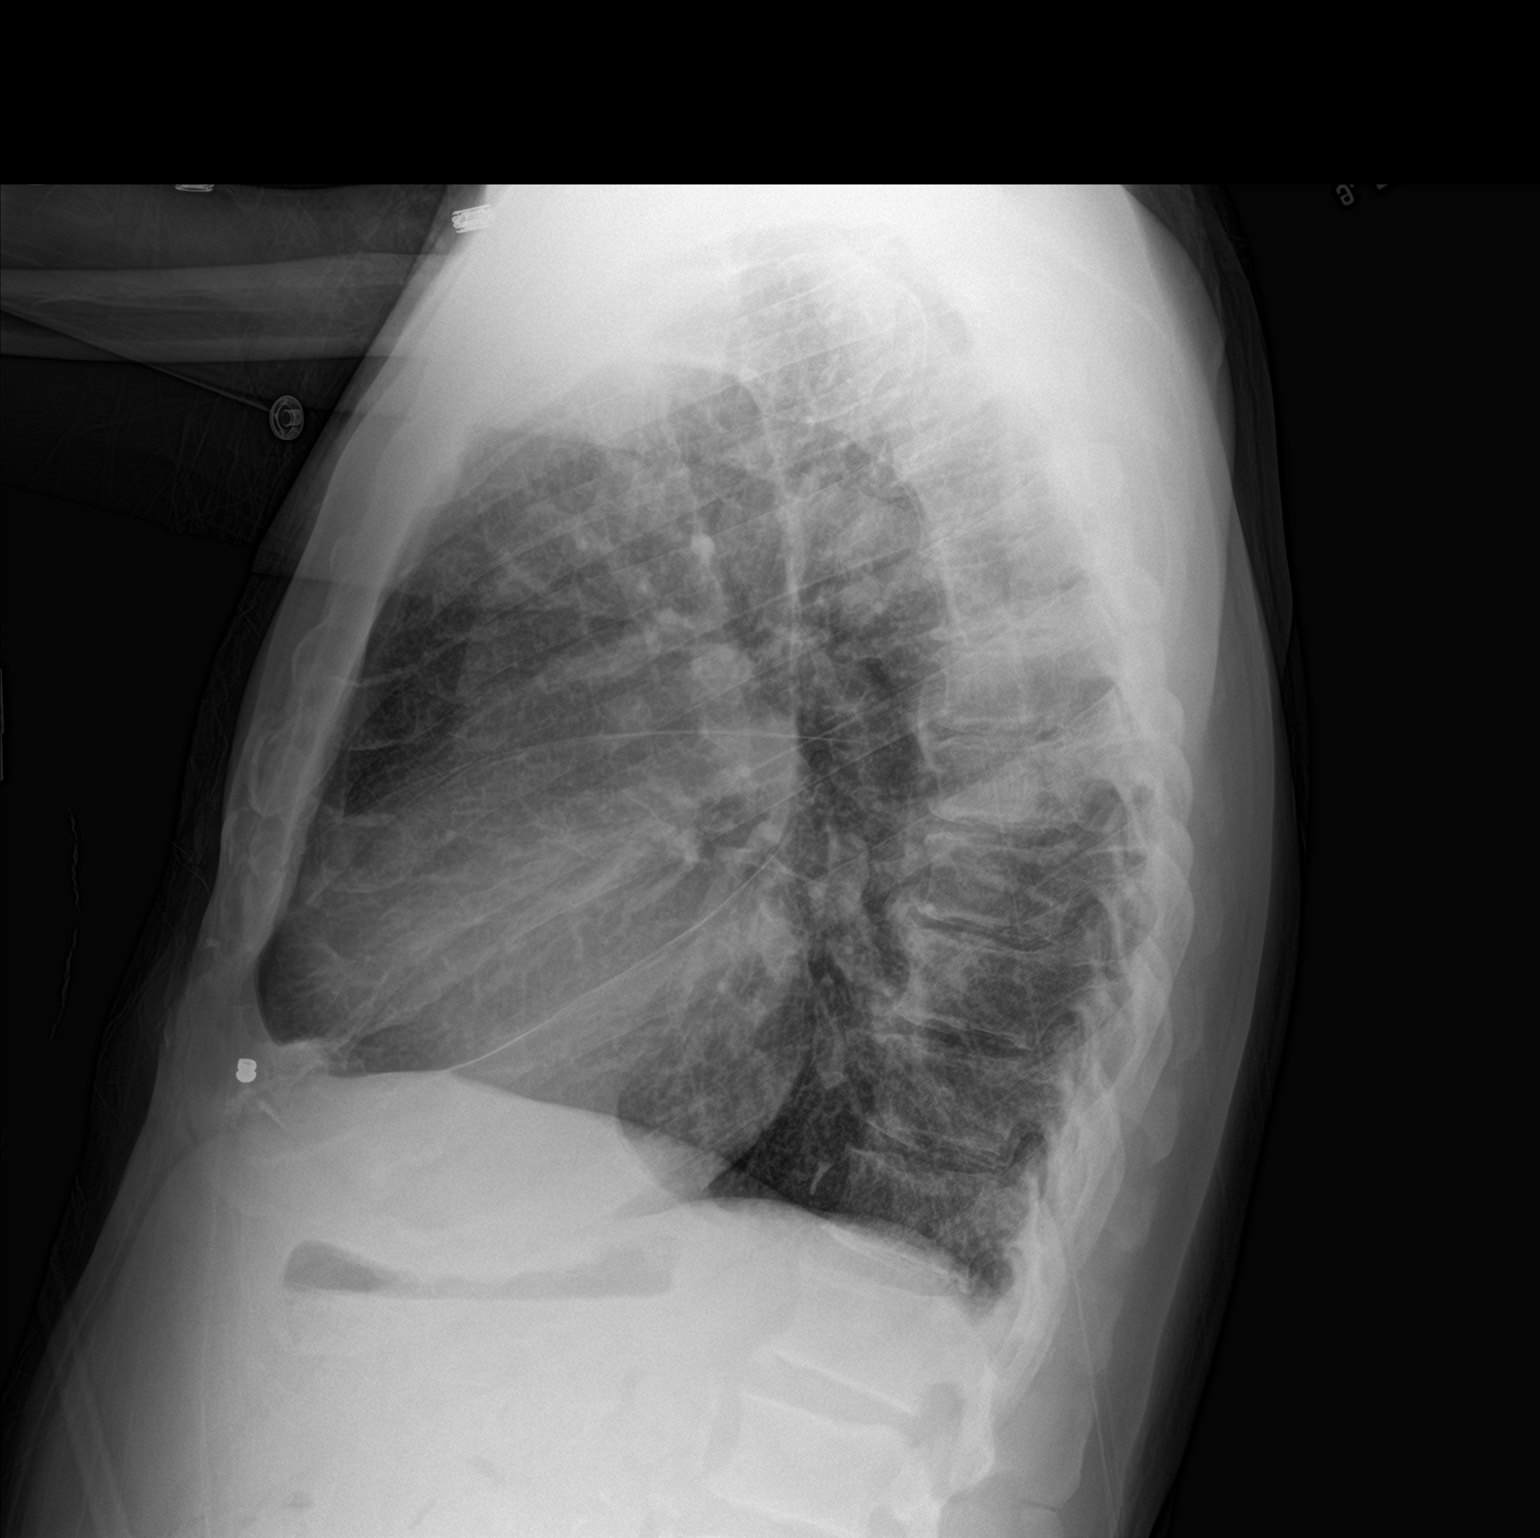

[2 of 2 positions shown; findings below may reference images not displayed]

FINDINGS: Chronic cardiomegaly and mild lung scarring. Chronic hilar
prominence that is likely vascular. There is no edema,
consolidation, effusion, or pneumothorax. Stable metallic body
overlapping the anterior right base.
IMPRESSION: 1. Baseline appearance of the chest.
2. Cardiomegaly and mild lung scarring.

## 2019-02-04 MED ORDER — FUROSEMIDE 10 MG/ML IJ SOLN
40.0000 mg | Freq: Once | INTRAMUSCULAR | Status: AC
  Administered 2019-02-04: 40 mg via INTRAMUSCULAR
  Filled 2019-02-04: qty 4

## 2019-02-04 MED ORDER — FUROSEMIDE 40 MG PO TABS: 40.0000 mg | ORAL_TABLET | Freq: Two times a day (BID) | ORAL | 0 refills | Status: DC

## 2019-02-04 MED ORDER — ACETAMINOPHEN 325 MG PO TABS
650.0000 mg | ORAL_TABLET | Freq: Once | ORAL | Status: AC
  Administered 2019-02-04: 650 mg via ORAL
  Filled 2019-02-04: qty 2

## 2019-02-04 MED ORDER — IBUPROFEN 400 MG PO TABS
400.0000 mg | ORAL_TABLET | Freq: Once | ORAL | Status: AC
  Administered 2019-02-04: 400 mg via ORAL
  Filled 2019-02-04: qty 1

## 2019-02-04 MED ORDER — ALBUTEROL SULFATE HFA 108 (90 BASE) MCG/ACT IN AERS
2.0000 | INHALATION_SPRAY | Freq: Once | RESPIRATORY_TRACT | Status: AC
  Administered 2019-02-04: 2 via RESPIRATORY_TRACT
  Filled 2019-02-04: qty 6.7

## 2019-02-04 MED ORDER — ACETAMINOPHEN 500 MG PO TABS
500.0000 mg | ORAL_TABLET | Freq: Once | ORAL | Status: AC
  Administered 2019-02-04: 16:00:00 500 mg via ORAL
  Filled 2019-02-04: qty 1

## 2019-02-04 NOTE — Discharge Instructions (Signed)
Please follow-up with your PCP for further management of your fluid overload.  Please take the Lasix, the fluid pill.  If any symptoms change or worsen, please return to the nearest emergency department.

## 2019-02-04 NOTE — Discharge Summary (Signed)
AMA Physician Discharge Summary      PROGRESS NOTE    Patient: [name]                            PCP: Patient, No Pcp Per                    DOB: [birth year]            DOA: 02/01/2019 [id number]             DOS: 02/04/2019, 7:58 AM   LOS: 1 day   Date of Service: The patient was seen and examined on 02/04/2019   Brief Narrative:   Taylor MemosFrederick L Polinski is a 58 y.o. male very well known to our service.  He has h/o CHF with EF 25-30% secondary to ongoing cocaine abuse, is non-adherant to therapy, frequently leaves hospital AMA.  22 out of 24 UDS studies this year positive for cocaine.  Patient was just admitted from 8/1-8/2 for anasarca / peripheral edema.  Put on TID lasix, he left AMA on 8/2.  Since leaving AMA he admits to not taking any lasix (or other meds for that matter) as outpatient.  I didn't ask him about cocaine.  He returns to the ED with ongoing peripheral edema, scrotal swelling, and some SOB.  ED Course: CXR shows mild pulm edema.  BNP 959.    He was started back on IV Lasix. And was admitted for further aggressive diuresing   AMA     Assessment & Plan:   Principal Problem:   Acute on chronic systolic CHF (congestive heart failure) (HCC) Active Problems:   Cocaine use disorder, severe, dependence (HCC)   Chronic paranoid schizophrenia (HCC)   Homelessness   Diabetes mellitus without complication (HCC)   CHF (congestive heart failure), NYHA class III, acute on chronic, combined (HCC)   Acute hypoxic respiratory failure Acute on chronic combined systolic/diastolic CHF exacerbation -Patient has left AMA 2 days ago, -Noncompliant/homeless - Noncompliant with his medication including Lasix, noting worsening lower extremity edema/scrotal edema associated with shortness of breath  -  BNP elevated to 1357 on last admission >>> 959    COVID-19 test negative.  Chest x-ray with cardiomegaly and mild volume overload. - TTE 11/07/2018 with EF  25-30%, moderate concentric LVH, diastolic dysfunction, severely reduced systolic function RV, diffuse hypokinesis LV, RVSP 51.2 mmHg. --n --Wt 109.1 kg >> 107.3 kg  -Continue IV Lasix 40 mg twice daily, -Fluid restriction -O2 supplements to maintain O2 sat greater than 90%,  --Strict I's and O's, daily weights  Polysubstance abuse -History of tobacco/alcohol/cocaine abuse - UDS this admission continues to be positive for cocaine.   -22 out of 24 UDS this year alone positive including the one from just 3 days ago  Discussed with patient need for total cessation. --Nicotine patch  --SW for assistance with outpatient resources; given his history likely to be noncompliant  Type 2 diabetes mellitus Hemoglobin A1c 9.7 on 12/04/2018.  On glipizide 2.5mg  PO BID at home, but likely noncompliant. --Holding oral hypoglycemics while inpatient --Consistent carbohydrate diet --Insulin sliding scale for coverage while inpatient  Schizophrenia / Depression Evaluated by psychiatry, Dr. Sharma CovertNorman on 8/1.  No need for inpatient psychiatry.   -Was evaluated for capacity on 12/08/2018 and was determined to have capacity make decisions regarding placement/medical decisions.   On the last admission psychiatry recommends outpatient follow-up with rehabilitation. --Tegretol 100 mg p.o. twice  daily --Abilify 10 mg p.o. daily --Outpatient follow-up    DVT prophylaxis: SCD/Compression stockings and Lovenox SQ Code Status:   Code Status: Prior Family Communication: None The above findings and plan of care has been discussed with patient and family in detail,  they expressed understanding and agreement of above. Disposition Plan:  2-3 days  Admission status:  NPATIEN -patient remained to have shortness of breath extensive lower extremity scrotal edema due to CHF exacerbation, needing IV Lasix, for aggressive diuresing  Consultants: None  Procedures:   No admission procedures for hospital encounter.      Antimicrobials:  Anti-infectives (From admission, onward)   None       Medication:       Objective:   Vitals:   02/02/19 1028 02/02/19 1036 02/02/19 1045 02/02/19 1135  BP: (!) 166/106  (!) 179/104 (!) 151/94  Pulse:  97 95 95  Resp:  (!) 22 (!) 22 20  Temp:    98.5 F (36.9 C)  TempSrc:    Oral  SpO2:  94% 100% 98%  Weight:    107.3 kg  Height:    5\' 9"  (1.753 m)   No intake or output data in the 24 hours ending 02/04/19 0758 Filed Weights   02/02/19 1135  Weight: 107.3 kg     Examination:   Physical Exam  Constitution:  Alert, cooperative, no distress,  Appears calm and comfortable  Psychiatric: Normal and stable mood and affect, cognition intact,   HEENT: Normocephalic, PERRL, otherwise with in Normal limits  Chest:Chest symmetric Cardio vascular:  S1/S2, RRR, No murmure, No Rubs or Gallops  pulmonary: Clear to auscultation bilaterally, respirations unlabored, negative wheezes / crackles Abdomen: Soft, non-tender, non-distended, bowel sounds,no masses, no organomegaly Muscular skeletal: Limited exam - in bed, able to move all 4 extremities, Normal strength,  Neuro: CNII-XII intact. , normal motor and sensation, reflexes intact  Extremities:  +2 bilateral pitting edema extending above the knee to the thigh, positive scrotal edema Skin: Dry, warm to touch, negative for any Rashes, scrotal edema erythema    LABs:  CBC Latest Ref Rng & Units 02/03/2019 02/01/2019 01/29/2019  WBC 4.0 - 10.5 K/uL 8.4 9.3 8.1  Hemoglobin 13.0 - 17.0 g/dL 8.5(L) 8.6(L) 8.4(L)  Hematocrit 39.0 - 52.0 % 29.7(L) 30.2(L) 30.0(L)  Platelets 150 - 400 K/uL 235 253 294   CMP Latest Ref Rng & Units 02/03/2019 02/01/2019 01/31/2019  Glucose 70 - 99 mg/dL 326(H) 260(H) 200(H)  BUN 6 - 20 mg/dL 15 19 15   Creatinine 0.61 - 1.24 mg/dL 0.90 1.03 0.87  Sodium 135 - 145 mmol/L 138 138 141  Potassium 3.5 - 5.1 mmol/L 3.6 3.2(L) 3.6  Chloride 98 - 111 mmol/L 98 98 102  CO2 22 - 32 mmol/L 30 29 29    Calcium 8.9 - 10.3 mg/dL 8.8(L) 8.7(L) 8.5(L)  Total Protein 6.5 - 8.1 g/dL 6.8 - -  Total Bilirubin 0.3 - 1.2 mg/dL 0.7 - -  Alkaline Phos 38 - 126 U/L 169(H) - -  AST 15 - 41 U/L 24 - -  ALT 0 - 44 U/L 11 - -    SIGNED: Deatra James, MD, FACP, FHM. [facility],  Pager [phone]  If 7PM-7AM, please contact night-coverage [email or web address] 02/04/2019, 7:58 AM

## 2019-02-04 NOTE — ED Provider Notes (Addendum)
7:10 AM Care assumed from Dr. Roxanne Mins.  At time of transfer of care, patient is awaiting reassessment and laboratory results.  Patient has frequent visits emergency department for mild hypoxia in the setting of fluid overload in his chest.  Patient has already been given a dose of IM Lasix.  Plan of care by previous team is to reassess the patient after his blood work returns.  If blood work does not show any remarkable abnormalities that require admission and his oxygen is weaned down to room air with a 90% or greater at rest, anticipate discharge home.  3:54 PM Patient has been weaned off of oxygen and resolving oxygen saturations in the low 90s.  Patient finally had blood work drawn after a miscommunication from about his blood work.  When his blood work is returned, anticipate discharge home.  Care transferred to Dr. Alvino Chapel while waiting for results of labs.   Hermelinda Diegel, Gwenyth Allegra, MD 02/04/19 1555  4:07 PM Just prior to transfer of care, patient's labs all returned.  They are all improving from prior.  Patient was given prescription for Lasix which is been on the past and will be discharged home.  Patient reports he does not have any more Lasix which he now will have.  Patient discharged in good condition with improved action saturations.   Clinical Impression: 1. Acute on chronic combined systolic and diastolic heart failure (HCC)   2. Pain in both feet   3. Medically noncompliant     Disposition: Discharge  Condition: Good  I have discussed the results, Dx and Tx plan with the pt(& family if present). He/she/they expressed understanding and agree(s) with the plan. Discharge instructions discussed at great length. Strict return precautions discussed and pt &/or family have verbalized understanding of the instructions. No further questions at time of discharge.    New Prescriptions   FUROSEMIDE (LASIX) 40 MG TABLET    Take 1 tablet (40 mg total) by mouth 2 (two) times daily.     Follow Up: No follow-up provider specified.    Ocie Tino, Gwenyth Allegra, MD 02/04/19 [phone]

## 2019-02-04 NOTE — ED Notes (Signed)
Patient's oxygen saturation on room air is 90%.

## 2019-02-04 NOTE — ED Provider Notes (Signed)
[facility]ENC[facility] Provider Note   CSN: [id number] Arrival date & time: 02/04/19  [id number]    History   Chief Complaint Chief Complaint  Patient presents with  . Shortness of Breath    HPI [name] is a 58 y.o. male.   The history is provided by the patient.  Shortness of Breath He has history of hypertension, diabetes, systolic and diastolic heart failure schizophrenia, peripheral neuropathy, substance abuse, homelessness who comes in complaining that his feet hurt from his neuropathy and that his legs are swollen from his heart failure.  He apparently had also told EMS that he was short of breath.  EMS had noted oxygen saturation of 86% on room air and put him on nasal oxygen with improvement in oxygen saturation to 97%.  He is quite evasive when I ask him if he is taking his medications, but does state that he is homeless.  Past Medical History:  Diagnosis Date  . Chronic foot pain   . Cocaine abuse (HCC)   . Diabetes mellitus without complication (HCC)   . Hepatitis C    unsure   . Homelessness   . Hypertension   . Neuropathy   . Polysubstance abuse (HCC)   . Schizophrenia (HCC)   . Sleep apnea   . Systolic and diastolic CHF, chronic [facility](HCC)     Patient Active Problem List   Diagnosis Date Noted  . CHF (congestive heart failure), NYHA class III, acute on chronic, combined (HCC) 02/02/2019  . Scrotal edema 01/30/2019  . Combined systolic and diastolic heart failure (HCC) 01/19/2019  . Chest pain 01/16/2019  . 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  . Medically noncompliant   . Acute on chronic systolic CHF (congestive heart failure) (HCC) 01/02/2019  . 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 Acute on chronic systolic heart failure (HCC) 12/26/2018  . Mobitz type II atrioventricular block 12/25/2018  . Acute exacerbation of CHF (congestive heart failure) (HCC) 12/22/2018  .  Acute on chronic respiratory failure with hypoxia (HCC) 12/22/2018  . CHF exacerbation (HCC) 12/19/2018  . Acute CHF (congestive heart failure) (HCC) 12/16/2018  . Diabetes mellitus without complication (HCC) 12/11/2018  . Acute on chronic systolic (congestive) heart failure (HCC) 12/11/2018  . Abdominal pain 12/11/2018  . Nausea vomiting and diarrhea 12/11/2018  . Microcytic anemia 12/11/2018  . Evaluation by psychiatric service required   . MDD (major depressive disorder), severe (HCC) 11/25/2018  . Pressure injury of skin 11/08/2018  . Elevated troponin 10/18/2018  . HLD (hyperlipidemia) 10/18/2018  . Anxiety 10/18/2018  . CHF (congestive heart failure), NYHA class II, acute on chronic, combined (HCC) 10/18/2018  . Hypertensive urgency 10/12/2018  . Mild renal insufficiency 10/12/2018  . Cellulitis 10/12/2018  . Penile cellulitis   . Adjustment disorder with mixed disturbance of emotions and conduct   . Sleep apnea 10/03/2018  . Hypokalemia 09/17/2018  . Scrotal swelling 09/17/2018  . Urinary hesitancy   . Constipation   . Acute on chronic combined systolic and diastolic CHF (congestive heart failure) (HCC) 08/25/2018  . Acute respiratory failure with hypoxia (HCC) 08/25/2018  . Tobacco use 08/18/2018  . Homelessness 08/08/2018  . Smoker 08/08/2018  . Prostate enlargement 03/16/2018  . Aortic atherosclerosis (HCC) 03/16/2018  . 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 Cocaine abuse with cocaine-induced mood disorder (HCC) 09/18/2017  . Chronic foot pain   . Schizoaffective disorder, bipolar type (HCC) 09/30/2016  . Substance  induced mood disorder ([place]) 03/13/2015  . Schizophrenia, paranoid type ([place]) 01/17/2015  . Suicidal ideation   . Drug hallucinosis ([place]) 10/08/2014  . Chronic paranoid schizophrenia ([place]) 09/07/2014  . 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 Urinary retention   . Cocaine  use disorder, severe, dependence ([place])   . Essential hypertension 03/28/2013  . Insulin-requiring or dependent type II diabetes mellitus ([place]) 03/15/2013    Past Surgical History:  Procedure Laterality Date  . MULTIPLE TOOTH EXTRACTIONS          Home Medications    Prior to Admission medications   Medication Sig Start Date End Date Taking? Authorizing Provider  acetaminophen (TYLENOL) 500 MG tablet Take 1 tablet (500 mg total) by mouth every 6 (six) hours as needed. Patient not taking: Reported on 01/19/2019 01/08/19   Domenic Moras, PA-C  ARIPiprazole (ABILIFY) 10 MG tablet Take 1 tablet (10 mg total) by mouth daily. Patient not taking: Reported on 01/13/2019 12/10/18   Flora Lipps, MD  carbamazepine (TEGRETOL) 200 MG tablet Take 0.5 tablets (100 mg total) by mouth 2 (two) times daily. Patient not taking: Reported on 01/15/2019 11/26/18   Daleen Bo, MD  furosemide (LASIX) 40 MG tablet Take 1 tablet (40 mg total) by mouth 2 (two) times daily. 01/29/19 02/28/19  Dorie Rank, MD  glipiZIDE (GLUCOTROL) 5 MG tablet Take 0.5 tablets (2.5 mg total) by mouth 2 (two) times daily before a meal. 01/01/19   Rai, Ripudeep K, MD  Ipratropium-Albuterol (COMBIVENT) 20-100 MCG/ACT AERS respimat Inhale 1 puff into the lungs every 6 (six) hours. Patient not taking: Reported on 01/15/2019 11/30/18   Palumbo, April, MD  lisinopril (ZESTRIL) 10 MG tablet Take 1 tablet (10 mg total) by mouth daily. Patient not taking: Reported on 01/15/2019 11/26/18   Daleen Bo, MD  potassium chloride SA (K-DUR) 20 MEQ tablet Take 1 tablet (20 mEq total) by mouth 2 (two) times daily. Patient not taking: Reported on 01/15/2019 11/30/18   Randal Buba, April, MD    Family History Family History  Problem Relation Age of Onset  . Hypertension Other   . Diabetes Other     Social History Social History   Tobacco Use  . Smoking status: Current Every Day Smoker    Packs/day: 1.00    Years: 20.00    Pack years: 20.00    Types:  Cigarettes  . Smokeless tobacco: Current User  Substance Use Topics  . Alcohol use: Yes    Comment: Daily Drinker   . Drug use: Yes    Frequency: 7.0 times per week    Types: "Crack" cocaine, Cocaine, Marijuana    Comment: Crack     Allergies   Haldol [haloperidol]   Review of Systems Review of Systems  Respiratory: Positive for shortness of breath.   All other systems reviewed and are negative.    Physical Exam Updated Vital Signs BP (!) 162/94   Pulse (!) 103   Temp 98.1 F (36.7 C) (Oral)   Resp (!) 26   SpO2 95%   Physical Exam Vitals signs and nursing note reviewed.    58 year old male, resting comfortably and in no acute distress. Vital signs are significant for elevated blood pressure and respiratory rate, borderline elevated heart rate. Oxygen saturation is 95%, which is normal. Head is normocephalic and atraumatic. PERRLA, EOMI. Oropharynx is clear. Neck is nontender and supple without adenopathy or JVD. Back is nontender and there is no CVA  tenderness. Lungs have scattered expiratory wheezes more prominent on the left, no rales or rhonchi. Chest is nontender. Heart has regular rate and rhythm without murmur. Abdomen is soft, flat, nontender without masses or hepatosplenomegaly and peristalsis is normoactive. Genitalia: Circumcised penis.  Testes descended.  Moderate to severe scrotal and penile edema. Extremities have tense edema extending up to the groin, full range of motion is present. Skin is warm and dry without rash. Neurologic: Awake and alert, cranial nerves are intact, there are no motor or sensory deficits.  ED Treatments / Results  Labs (all labs ordered are listed, but only abnormal results are displayed) Labs Reviewed  BASIC METABOLIC PANEL  BRAIN NATRIURETIC PEPTIDE  CBC WITH DIFFERENTIAL/PLATELET  RAPID URINE DRUG SCREEN, HOSP PERFORMED    Radiology Dg Chest Port 1 View  Result Date: 02/04/2019 CLINICAL DATA:  Shortness of breath  EXAM: PORTABLE CHEST 1 VIEW COMPARISON:  Three days ago FINDINGS: Cardiomegaly. Vascular congestion at the hila. Generalized interstitial coarsening. Band of atelectasis in the left perihilar lung. Stable blunting of the lateral left costophrenic sulcus. IMPRESSION: Stable cardiomegaly and vascular congestion with small left effusion. Electronically Signed   By: Marnee SpringJonathon  Watts M.D.   On: 02/04/2019 05:13    Procedures Procedures   Medications Ordered in ED Medications  furosemide (LASIX) injection 40 mg (has no administration in time range)  acetaminophen (TYLENOL) tablet 650 mg (has no administration in time range)  ibuprofen (ADVIL) tablet 400 mg (has no administration in time range)     Initial Impression / Assessment and Plan / ED Course  I have reviewed the triage vital signs and the nursing notes.  Pertinent labs & imaging results that were available during my care of the patient were reviewed by me and considered in my medical decision making (see chart for details).  Ongoing difficulty with combined systolic and diastolic heart failure, probably because of his failure to take medication.  Chronic peripheral painful neuropathy.  Because of evidence of bronchospasm, he will be given an albuterol inhaler to use.  Will check chest x-ray and screening labs.  He is given acetaminophen and ibuprofen for pain.  He is given intramuscular furosemide for diuresis.  Old records are reviewed showing multiple ED visits and hospitalizations for heart failure and pain.  In the last 6 months, he has had 104 ED visits and [facility] hospitalizations in [facility].  When admitted, he usually leaves AGAINST MEDICAL ADVICE.  Most recent hospital admission was July 31 through August 3.  Chest x-ray shows changes of heart failure but not significantly changed from recent chest x-ray.  He has had fairly good diuresis from intramuscular furosemide.  Labs are pending.  Case is signed out to Dr. Rush Landmarkegeler.   Final Clinical Impressions(s) / ED Diagnoses   Final diagnoses:  Acute on chronic combined systolic and diastolic heart failure (HCC)  Pain in both feet  Medically noncompliant    ED Discharge Orders    None       Dione BoozeGlick, Kathy Wahid, MD 02/04/19 [phone]

## 2019-02-04 NOTE — ED Notes (Signed)
Pt refusing to keep VS monitoring on.

## 2019-02-04 NOTE — ED Notes (Signed)
Satting 96% on RA.

## 2019-02-04 NOTE — ED Triage Notes (Signed)
Pt BIB [facility] due to SOB.  On arrival EMS notes pt was 86% RA, 158/97, 102 P, T 97.9,, CBG 348. Pt arrives alert, NAD, 2L O2 97%.

## 2019-02-05 ENCOUNTER — Emergency Department (HOSPITAL_COMMUNITY): Payer: Medicaid Other

## 2019-02-05 ENCOUNTER — Encounter (HOSPITAL_COMMUNITY): Payer: Self-pay | Admitting: *Deleted

## 2019-02-05 ENCOUNTER — Other Ambulatory Visit: Payer: Self-pay

## 2019-02-05 ENCOUNTER — Emergency Department (HOSPITAL_COMMUNITY)
Admission: EM | Admit: 2019-02-05 | Discharge: 2019-02-05 | Disposition: A | Discharge status: Home / Self Care | Payer: Medicaid Other | Attending: Emergency Medicine | Admitting: Emergency Medicine

## 2019-02-05 DIAGNOSIS — Z79899 Other long term (current) drug therapy: Secondary | ICD-10-CM | POA: Insufficient documentation

## 2019-02-05 DIAGNOSIS — Z794 Long term (current) use of insulin: Secondary | ICD-10-CM | POA: Insufficient documentation

## 2019-02-05 DIAGNOSIS — E119 Type 2 diabetes mellitus without complications: Secondary | ICD-10-CM | POA: Diagnosis not present

## 2019-02-05 DIAGNOSIS — I5043 Acute on chronic combined systolic (congestive) and diastolic (congestive) heart failure: Secondary | ICD-10-CM | POA: Diagnosis not present

## 2019-02-05 DIAGNOSIS — R600 Localized edema: Secondary | ICD-10-CM | POA: Diagnosis not present

## 2019-02-05 DIAGNOSIS — F17210 Nicotine dependence, cigarettes, uncomplicated: Secondary | ICD-10-CM | POA: Diagnosis not present

## 2019-02-05 DIAGNOSIS — I10 Essential (primary) hypertension: Secondary | ICD-10-CM | POA: Diagnosis not present

## 2019-02-05 DIAGNOSIS — Z590 Homelessness: Secondary | ICD-10-CM | POA: Diagnosis not present

## 2019-02-05 DIAGNOSIS — R609 Edema, unspecified: Secondary | ICD-10-CM

## 2019-02-05 DIAGNOSIS — N5082 Scrotal pain: Secondary | ICD-10-CM | POA: Diagnosis present

## 2019-02-05 LAB — COMPREHENSIVE METABOLIC PANEL
ALT: 12 U/L (ref 0–44)
AST: 25 U/L (ref 15–41)
Albumin: 2.6 g/dL — ABNORMAL LOW (ref 3.5–5.0)
Alkaline Phosphatase: 162 U/L — ABNORMAL HIGH (ref 38–126)
Anion gap: 12 (ref 5–15)
BUN: 17 mg/dL (ref 6–20)
CO2: 28 mmol/L (ref 22–32)
Calcium: 8.8 mg/dL — ABNORMAL LOW (ref 8.9–10.3)
Chloride: 99 mmol/L (ref 98–111)
Creatinine, Ser: 1.02 mg/dL (ref 0.61–1.24)
GFR calc Af Amer: >60 mL/min (ref >60)
GFR calc non Af Amer: >60 mL/min (ref >60)
Glucose, Bld: 238 mg/dL — ABNORMAL HIGH (ref 70–99)
Potassium: 3.5 mmol/L (ref 3.5–5.1)
Sodium: 139 mmol/L (ref 135–145)
Total Bilirubin: 0.5 mg/dL (ref 0.3–1.2)
Total Protein: 6.8 g/dL (ref 6.5–8.1)

## 2019-02-05 LAB — CBC WITH DIFFERENTIAL/PLATELET
Abs Immature Granulocytes: 0.02 10*3/uL (ref 0.00–0.07)
Basophils Absolute: 0 10*3/uL (ref 0.0–0.1)
Basophils Relative: 1 %
Eosinophils Absolute: 0 10*3/uL (ref 0.0–0.5)
Eosinophils Relative: 0 %
HCT: 30.3 % — ABNORMAL LOW (ref 39.0–52.0)
Hemoglobin: 8.5 g/dL — ABNORMAL LOW (ref 13.0–17.0)
Immature Granulocytes: 0 %
Lymphocytes Relative: 11 %
Lymphs Abs: 0.9 10*3/uL (ref 0.7–4.0)
MCH: 21.4 pg — ABNORMAL LOW (ref 26.0–34.0)
MCHC: 28.1 g/dL — ABNORMAL LOW (ref 30.0–36.0)
MCV: 76.3 fL — ABNORMAL LOW (ref 80.0–100.0)
Monocytes Absolute: 0.9 10*3/uL (ref 0.1–1.0)
Monocytes Relative: 10 %
Neutro Abs: 6.5 10*3/uL (ref 1.7–7.7)
Neutrophils Relative %: 78 %
Platelets: 233 10*3/uL (ref 150–400)
RBC: 3.97 MIL/uL — ABNORMAL LOW (ref 4.22–5.81)
RDW: 21.6 % — ABNORMAL HIGH (ref 11.5–15.5)
WBC: 8.3 10*3/uL (ref 4.0–10.5)
nRBC: 0 % (ref 0.0–0.2)

## 2019-02-05 LAB — BRAIN NATRIURETIC PEPTIDE: B Natriuretic Peptide: 1010.4 pg/mL — ABNORMAL HIGH (ref 0.0–100.0)

## 2019-02-05 MED ORDER — ACETAMINOPHEN 325 MG PO TABS
650.0000 mg | ORAL_TABLET | Freq: Once | ORAL | Status: AC
  Administered 2019-02-05: 650 mg via ORAL
  Filled 2019-02-05: qty 2

## 2019-02-05 MED ORDER — FUROSEMIDE 10 MG/ML IJ SOLN
40.0000 mg | Freq: Once | INTRAMUSCULAR | Status: AC
  Administered 2019-02-05: 40 mg via INTRAMUSCULAR
  Filled 2019-02-05: qty 4

## 2019-02-05 MED ORDER — ACETAMINOPHEN 500 MG PO TABS
500.0000 mg | ORAL_TABLET | Freq: Once | ORAL | Status: AC
  Administered 2019-02-05: 500 mg via ORAL
  Filled 2019-02-05: qty 1

## 2019-02-05 NOTE — Discharge Instructions (Addendum)
Take your Lasix as prescribed.  Follow-up with the health and wellness clinic.

## 2019-02-05 NOTE — ED Notes (Signed)
Pt provided carton of milk and graham crackers. Okayed by EDP, Lita Mains.

## 2019-02-05 NOTE — ED Notes (Signed)
Discussed patient is up for discharge - patient confused, states he can hardly walk (after being observed by staff steadily ambulating in hall with pulse ox) and wants to speak with MD - made aware and to speak with patient regarding plan of care.

## 2019-02-05 NOTE — ED Notes (Addendum)
Pt ambulated in hallway on pulse ox. O2 saturation stayed between 96-94% at the beginning of ambulating and did drop to 90%. O2 saturation rose to 92% and maintained that for the majority of the time. Pt had no complaints when asked by RN, Tonita Cong. Pt is now back in exam room and in the stretcher with O2 saturation at 94%. EDP, [place], notified.

## 2019-02-05 NOTE — ED Provider Notes (Signed)
[facility] Provider Note   CSN: [id number] Arrival date & time: 02/05/19  [id number]    History   Chief Complaint Chief Complaint  Patient presents with   Testicle Pain    HPI [name] is a 58 y.o. male.     HPI Patient with history of diabetes, CHF, noncompliance and homelessness presents with ongoing scrotal swelling and lower extremity swelling.  He also is having increasing shortness of breath especially with minor exertion.  He denies cough, fever or chills.  No chest pain.  States he just got his Lasix filled but has not taken it yet. Past Medical History:  Diagnosis Date   Chronic foot pain    Cocaine abuse (HCC)    Diabetes mellitus without complication (HCC)    Hepatitis C    unsure    Homelessness    Hypertension    Neuropathy    Polysubstance abuse (HCC)    Schizophrenia (HCC)    Sleep apnea    Systolic and diastolic CHF, chronic (HCC)     Patient Active Problem List   Diagnosis Date Noted   CHF (congestive heart failure), NYHA class III, acute on chronic, combined (HCC) 02/02/2019   Scrotal edema 01/30/2019   Combined systolic and diastolic heart failure (HCC) 01/19/2019   Chest pain 01/16/2019   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   Medically noncompliant    Acute on chronic systolic CHF (congestive heart failure) (HCC) 01/02/2019   Acute on chronic combined systolic (congestive) and diastolic (congestive) heart failure (HCC) 01/02/2019   Acute on chronic systolic heart failure (HCC) 12/26/2018   Mobitz type II atrioventricular block 12/25/2018   Acute exacerbation of CHF (congestive heart failure) (HCC) 12/22/2018   Acute on chronic respiratory failure with hypoxia (HCC) 12/22/2018   CHF exacerbation (HCC) 12/19/2018   Acute CHF (congestive heart failure) (HCC) 12/16/2018   Diabetes mellitus without complication (HCC) 12/11/2018   Acute on chronic systolic (congestive) heart failure (HCC)  12/11/2018   Abdominal pain 12/11/2018   Nausea vomiting and diarrhea 12/11/2018   Microcytic anemia 12/11/2018   Evaluation by psychiatric service required    MDD (major depressive disorder), severe (HCC) 11/25/2018   Pressure injury of skin 11/08/2018   Elevated troponin 10/18/2018   HLD (hyperlipidemia) 10/18/2018   Anxiety 10/18/2018   CHF (congestive heart failure), NYHA class II, acute on chronic, combined (HCC) 10/18/2018   Hypertensive urgency 10/12/2018   Mild renal insufficiency 10/12/2018   Cellulitis 10/12/2018   Penile cellulitis    Adjustment disorder with mixed disturbance of emotions and conduct    Sleep apnea 10/03/2018   Hypokalemia 09/17/2018   Scrotal swelling 09/17/2018   Urinary hesitancy    Constipation    Acute on chronic combined systolic and diastolic CHF (congestive heart failure) (HCC) 08/25/2018   Acute respiratory failure with hypoxia (HCC) 08/25/2018   Tobacco use 08/18/2018   Homelessness 08/08/2018   Smoker 08/08/2018   Prostate enlargement 03/16/2018   Aortic atherosclerosis (HCC) 03/16/2018   Aneurysm of abdominal aorta (HCC) 03/16/2018   Cocaine abuse with cocaine-induced mood disorder (HCC) 09/18/2017   Chronic foot pain    Schizoaffective disorder, bipolar type (HCC) 09/30/2016   Substance induced mood disorder (HCC) 03/13/2015   Schizophrenia, paranoid type (HCC) 01/17/2015   Suicidal ideation    Drug hallucinosis (HCC) 10/08/2014   Chronic paranoid schizophrenia (HCC) 09/07/2014   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/10/2014   Urinary retention  Cocaine use disorder, severe, dependence (HCC)    Essential hypertension 03/28/2013   Insulin-requiring or dependent type II diabetes mellitus (HCC) 03/15/2013    Past Surgical History:  Procedure Laterality Date   MULTIPLE TOOTH EXTRACTIONS          Home Medications    Prior  to Admission medications   Medication Sig Start Date End Date Taking? Authorizing Provider  furosemide (LASIX) 40 MG tablet Take 1 tablet (40 mg total) by mouth 2 (two) times daily. 01/29/19 02/28/19 Yes Linwood DibblesKnapp, Jon, MD  glipiZIDE (GLUCOTROL) 5 MG tablet Take 0.5 tablets (2.5 mg total) by mouth 2 (two) times daily before a meal. 01/01/19  Yes Rai, Ripudeep K, MD  acetaminophen (TYLENOL) 500 MG tablet Take 1 tablet (500 mg total) by mouth every 6 (six) hours as needed. Patient not taking: Reported on 01/19/2019 01/08/19   Fayrene Helperran, [place], PA-C  ARIPiprazole (ABILIFY) 10 MG tablet Take 1 tablet (10 mg total) by mouth daily. Patient not taking: Reported on 01/13/2019 12/10/18   Joycelyn DasPokhrel, Laxman, MD  carbamazepine (TEGRETOL) 200 MG tablet Take 0.5 tablets (100 mg total) by mouth 2 (two) times daily. Patient not taking: Reported on 01/15/2019 11/26/18   Mancel BaleWentz, Elliott, MD  furosemide (LASIX) 40 MG tablet Take 1 tablet (40 mg total) by mouth 2 (two) times daily. Patient not taking: Reported on 02/05/2019 02/04/19   Tegeler, Canary Brimhristopher J, MD  Ipratropium-Albuterol (COMBIVENT) 20-100 MCG/ACT AERS respimat Inhale 1 puff into the lungs every 6 (six) hours. Patient not taking: Reported on 01/15/2019 11/30/18   Palumbo, April, MD  lisinopril (ZESTRIL) 10 MG tablet Take 1 tablet (10 mg total) by mouth daily. Patient not taking: Reported on 01/15/2019 11/26/18   Mancel BaleWentz, Elliott, MD  potassium chloride SA (K-DUR) 20 MEQ tablet Take 1 tablet (20 mEq total) by mouth 2 (two) times daily. Patient not taking: Reported on 01/15/2019 11/30/18   Palumbo, April, MD    Family History Family History  Problem Relation Age of Onset   Hypertension Other    Diabetes Other     Social History Social History   Tobacco Use   Smoking status: Current Every Day Smoker    Packs/day: 1.00    Years: 20.00    Pack years: 20.00    Types: Cigarettes   Smokeless tobacco: Current User  Substance Use Topics   Alcohol use: Yes    Comment: Daily  Drinker    Drug use: Yes    Frequency: 7.0 times per week    Types: "Crack" cocaine, Cocaine, Marijuana    Comment: Crack     Allergies   Haldol [haloperidol]   Review of Systems Review of Systems  Constitutional: Negative for chills and fever.  Respiratory: Positive for shortness of breath and wheezing. Negative for cough.   Cardiovascular: Positive for leg swelling. Negative for chest pain and palpitations.  Gastrointestinal: Positive for abdominal distention. Negative for abdominal pain, constipation, diarrhea, nausea and vomiting.  Genitourinary: Positive for penile swelling and scrotal swelling. Negative for difficulty urinating, dysuria and testicular pain.  Musculoskeletal: Negative for back pain and neck pain.  Skin: Negative for rash and wound.  Neurological: Negative for dizziness, weakness, light-headedness, numbness and headaches.  All other systems reviewed and are negative.    Physical Exam Updated Vital Signs BP (!) 169/103    Pulse 100    Temp (!) 97.2 F (36.2 C)    Resp (!) 24    SpO2 100%   Physical Exam Vitals signs and nursing note  reviewed.  Constitutional:      Appearance: He is well-developed.     Comments: Chronically ill-appearing.  Disheveled.  HENT:     Head: Normocephalic and atraumatic.     Nose: Nose normal.     Mouth/Throat:     Mouth: Mucous membranes are moist.  Eyes:     Pupils: Pupils are equal, round, and reactive to light.  Neck:     Musculoskeletal: Normal range of motion and neck supple.  Cardiovascular:     Rate and Rhythm: Normal rate and regular rhythm.     Heart sounds: No murmur. No friction rub. No gallop.   Pulmonary:     Effort: Pulmonary effort is normal.     Breath sounds: Wheezing present.     Comments: Diminished breath sounds at bilateral bases.  Few scattered wheezes.  No obvious respiratory distress. Abdominal:     General: Bowel sounds are normal. There is distension.     Palpations: Abdomen is soft.      Tenderness: There is no abdominal tenderness. There is no guarding or rebound.     Comments: Anasarca present.  No tenderness to palpation.  Genitourinary:    Comments: Edematous penis and scrotum.  No crepitance appreciated.  No definite warmth or redness. Musculoskeletal: Normal range of motion.        General: No tenderness.     Right lower leg: Edema present.     Left lower leg: Edema present.  Skin:    General: Skin is warm and dry.     Capillary Refill: Capillary refill takes less than 2 seconds.     Findings: No erythema or rash.  Neurological:     General: No focal deficit present.     Mental Status: He is alert and oriented to person, place, and time.  Psychiatric:        Behavior: Behavior normal.      ED Treatments / Results  Labs (all labs ordered are listed, but only abnormal results are displayed) Labs Reviewed  CBC WITH DIFFERENTIAL/PLATELET - Abnormal; Notable for the following components:      Result Value   RBC 3.97 (*)    Hemoglobin 8.5 (*)    HCT 30.3 (*)    MCV 76.3 (*)    MCH 21.4 (*)    MCHC 28.1 (*)    RDW 21.6 (*)    All other components within normal limits  BRAIN NATRIURETIC PEPTIDE - Abnormal; Notable for the following components:   B Natriuretic Peptide 1,010.4 (*)    All other components within normal limits  COMPREHENSIVE METABOLIC PANEL - Abnormal; Notable for the following components:   Glucose, Bld 238 (*)    Calcium 8.8 (*)    Albumin 2.6 (*)    Alkaline Phosphatase 162 (*)    All other components within normal limits    EKG None  Radiology Dg Chest Port 1 View  Result Date: 02/05/2019 CLINICAL DATA:  Shortness of breath EXAM: PORTABLE CHEST 1 VIEW COMPARISON:  February 04, 2019 FINDINGS: There is cardiomegaly with pulmonary venous hypertension. There is atelectatic change in the left mid lung region. There is no frank edema or consolidation. There is a small left pleural effusion. There is aortic atherosclerosis. No adenopathy. No  bone lesions. IMPRESSION: Cardiomegaly with pulmonary vascular congestion. Small left pleural effusion. Stable atelectasis left mid lung. No frank edema or consolidation. Aortic Atherosclerosis (ICD10-I70.0). Electronically Signed   By: Lowella Grip III M.D.   On: 02/05/2019 08:17  Dg Chest Port 1 View  Result Date: 02/04/2019 CLINICAL DATA:  Shortness of breath EXAM: PORTABLE CHEST 1 VIEW COMPARISON:  Three days ago FINDINGS: Cardiomegaly. Vascular congestion at the hila. Generalized interstitial coarsening. Band of atelectasis in the left perihilar lung. Stable blunting of the lateral left costophrenic sulcus. IMPRESSION: Stable cardiomegaly and vascular congestion with small left effusion. Electronically Signed   By: Marnee SpringJonathon  Watts M.D.   On: 02/04/2019 05:13    Procedures Procedures (including critical care time)  Medications Ordered in ED Medications  furosemide (LASIX) injection 40 mg (40 mg Intramuscular Given 02/05/19 0848)  acetaminophen (TYLENOL) tablet 650 mg (650 mg Oral Given 02/05/19 0916)     Initial Impression / Assessment and Plan / ED Course  I have reviewed the triage vital signs and the nursing notes.  Pertinent labs & imaging results that were available during my care of the patient were reviewed by me and considered in my medical decision making (see chart for details).        Low suspicion for testicular emergency like torsion.  Patient symptoms likely due to chronic edema.  Given IM Lasix with good diuresis.  Is maintaining saturations above 90% with ambulation.  States he has his prescription for Lasix.  Advised to take it and given information for follow-up with the health and wellness clinic.  Return precautions given.  Final Clinical Impressions(s) / ED Diagnoses   Final diagnoses:  Peripheral edema    ED Discharge Orders    None       Loren RacerYelverton, Iley Breeden, MD 02/05/19 1152

## 2019-02-05 NOTE — ED Notes (Signed)
Patient escorted out by security and [employer] after becoming irate that he didn't receive his food prior to leaving. MD had already spoken with patient regarding discharge instructions. Patient yelling at top of lungs, but steadily ambulated to wheelchair and escorted to ED entrance.

## 2019-02-05 NOTE — ED Triage Notes (Signed)
Pt brought in by EMS with testicle pain/swelling and leg swelling. Sats were 84% RA, impoved to 100% on 5 liters. En route vitals 167/106, hr 110, 296 cbg, temp 98.2. No iv established.   On arrival to ED, pt saturations 85-92% on RA. Pt REFUSES to wear oxygen "I am not wearing oxygen until somebody fixes my scrotum, that's not gonna help me". Encouraged pt to wear oxygen.

## 2019-02-06 ENCOUNTER — Other Ambulatory Visit: Payer: Self-pay

## 2019-02-06 ENCOUNTER — Emergency Department (HOSPITAL_COMMUNITY)
Admission: EM | Admit: 2019-02-06 | Discharge: 2019-02-06 | Disposition: A | Discharge status: Home / Self Care | Payer: Medicaid Other | Attending: Emergency Medicine | Admitting: Emergency Medicine

## 2019-02-06 DIAGNOSIS — R600 Localized edema: Secondary | ICD-10-CM | POA: Diagnosis not present

## 2019-02-06 DIAGNOSIS — N489 Disorder of penis, unspecified: Secondary | ICD-10-CM | POA: Insufficient documentation

## 2019-02-06 DIAGNOSIS — I10 Essential (primary) hypertension: Secondary | ICD-10-CM | POA: Insufficient documentation

## 2019-02-06 DIAGNOSIS — Z590 Homelessness: Secondary | ICD-10-CM | POA: Insufficient documentation

## 2019-02-06 DIAGNOSIS — F1490 Cocaine use, unspecified, uncomplicated: Secondary | ICD-10-CM | POA: Diagnosis not present

## 2019-02-06 DIAGNOSIS — E119 Type 2 diabetes mellitus without complications: Secondary | ICD-10-CM | POA: Insufficient documentation

## 2019-02-06 DIAGNOSIS — F17210 Nicotine dependence, cigarettes, uncomplicated: Secondary | ICD-10-CM | POA: Diagnosis not present

## 2019-02-06 DIAGNOSIS — I5042 Chronic combined systolic (congestive) and diastolic (congestive) heart failure: Secondary | ICD-10-CM | POA: Diagnosis not present

## 2019-02-06 DIAGNOSIS — M79606 Pain in leg, unspecified: Secondary | ICD-10-CM | POA: Diagnosis present

## 2019-02-06 LAB — CBG MONITORING, ED: Glucose-Capillary: 317 mg/dL — ABNORMAL HIGH (ref 70–99)

## 2019-02-06 MED ORDER — FUROSEMIDE 20 MG PO TABS
40.0000 mg | ORAL_TABLET | Freq: Once | ORAL | Status: AC
  Administered 2019-02-06: 40 mg via ORAL
  Filled 2019-02-06: qty 2

## 2019-02-06 MED ORDER — GLIPIZIDE 5 MG PO TABS
5.0000 mg | ORAL_TABLET | Freq: Every day | ORAL | Status: DC
  Administered 2019-02-06: 5 mg via ORAL
  Filled 2019-02-06: qty 1

## 2019-02-06 MED ORDER — ACETAMINOPHEN 500 MG PO TABS
1000.0000 mg | ORAL_TABLET | Freq: Once | ORAL | Status: AC
  Administered 2019-02-06: 1000 mg via ORAL
  Filled 2019-02-06: qty 2

## 2019-02-06 MED ORDER — LISINOPRIL 10 MG PO TABS
10.0000 mg | ORAL_TABLET | Freq: Once | ORAL | Status: AC
  Administered 2019-02-06: 10 mg via ORAL
  Filled 2019-02-06: qty 1

## 2019-02-06 NOTE — ED Provider Notes (Signed)
[facility]   CSN: [id number] Arrival date & time: 02/06/19  0457     History   Chief Complaint Chief Complaint  Patient presents with   Leg Pain    HPI [name] is a 58 y.o. male.     Patient is a 58 year old male well-known to the emergency room with frequent visits with history of diabetes, daily cocaine use, CHF with persistent lower extremity swelling, hypertension, homelessness, chronic scrotal swelling and penile lesion who is presenting today with complaints of swelling, leg pain and wound to his penis.  Patient denies any shortness of breath or chest pain today.  It is unclear if he is taking any medications and he continually does states he is homeless.  He denies cough, fever, vomiting or diarrhea.  He states that sometimes he has difficulty urinating but eventually he is able to get the urine out.  He denies any dysuria.  Patient states he has not been been sexually active for years.  The history is provided by the patient.  Leg Pain   Past Medical History:  Diagnosis Date   Chronic foot pain    Cocaine abuse (HCC)    Diabetes mellitus without complication (HCC)    Hepatitis C    unsure    Homelessness    Hypertension    Neuropathy    Polysubstance abuse (HCC)    Schizophrenia (HCC)    Sleep apnea    Systolic and diastolic CHF, chronic (HCC)     Patient Active Problem List   Diagnosis Date Noted   CHF (congestive heart failure), NYHA class III, acute on chronic, combined (HCC) 02/02/2019   Scrotal edema 01/30/2019   Combined systolic and diastolic heart failure (HCC) 01/19/2019   Chest pain 01/16/2019   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   Medically noncompliant    Acute on chronic systolic CHF (congestive heart failure) (HCC) 01/02/2019   Acute on chronic combined systolic (congestive) and diastolic (congestive) heart failure (HCC) 01/02/2019   Acute on chronic systolic heart failure  (HCC) 25/36/644006/27/2020   Mobitz type II atrioventricular block 12/25/2018   Acute exacerbation of CHF (congestive heart failure) (HCC) 12/22/2018   Acute on chronic respiratory failure with hypoxia (HCC) 12/22/2018   CHF exacerbation (HCC) 12/19/2018   Acute CHF (congestive heart failure) (HCC) 12/16/2018   Diabetes mellitus without complication (HCC) 12/11/2018   Acute on chronic systolic (congestive) heart failure (HCC) 12/11/2018   Abdominal pain 12/11/2018   Nausea vomiting and diarrhea 12/11/2018   Microcytic anemia 12/11/2018   Evaluation by psychiatric service required    MDD (major depressive disorder), severe (HCC) 11/25/2018   Pressure injury of skin 11/08/2018   Elevated troponin 10/18/2018   HLD (hyperlipidemia) 10/18/2018   Anxiety 10/18/2018   CHF (congestive heart failure), NYHA class II, acute on chronic, combined (HCC) 10/18/2018   Hypertensive urgency 10/12/2018   Mild renal insufficiency 10/12/2018   Cellulitis 10/12/2018   Penile cellulitis    Adjustment disorder with mixed disturbance of emotions and conduct    Sleep apnea 10/03/2018   Hypokalemia 09/17/2018   Scrotal swelling 09/17/2018   Urinary hesitancy    Constipation    Acute on chronic combined systolic and diastolic CHF (congestive heart failure) (HCC) 08/25/2018   Acute respiratory failure with hypoxia (HCC) 08/25/2018   Tobacco use 08/18/2018   Homelessness 08/08/2018   Smoker 08/08/2018   Prostate enlargement 03/16/2018   Aortic atherosclerosis (HCC) 03/16/2018   Aneurysm of abdominal aorta (HCC)  03/16/2018   Cocaine abuse with cocaine-induced mood disorder ([place]) 09/18/2017   Chronic foot pain    Schizoaffective disorder, bipolar type ([place]) 09/30/2016   Substance induced mood disorder ([place]) 03/13/2015   Schizophrenia, paranoid type ([place]) 01/17/2015   Suicidal ideation    Drug hallucinosis ([place]) 10/08/2014   Chronic paranoid schizophrenia ([place]) 09/07/2014     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 Authorizing Provider  furosemide (LASIX) 40 MG tablet Take 1 tablet (40 mg total) by mouth 2 (two) times daily. 01/29/19 02/28/19 Yes Dorie Rank, MD  glipiZIDE (GLUCOTROL) 5 MG tablet Take 0.5 tablets (2.5 mg total) by mouth 2 (two) times daily before a meal. 01/01/19  Yes Rai, Ripudeep K, MD  acetaminophen (TYLENOL) 500 MG tablet Take 1 tablet (500 mg total) by mouth every 6 (six) hours as needed. Patient not taking: Reported on 01/19/2019 01/08/19   Domenic Moras, PA-C  ARIPiprazole (ABILIFY) 10 MG tablet Take 1 tablet (10 mg total) by mouth daily. Patient not taking: Reported on 01/13/2019 12/10/18   Flora Lipps, MD  carbamazepine (TEGRETOL) 200 MG tablet Take 0.5 tablets (100 mg total) by mouth 2 (two) times daily. Patient not taking: Reported on 01/15/2019 11/26/18   Daleen Bo, MD  furosemide (LASIX) 40 MG tablet Take 1 tablet (40 mg total) by mouth 2 (two) times daily. Patient not taking: Reported on 02/05/2019 02/04/19   Tegeler, Gwenyth Allegra, MD  Ipratropium-Albuterol (COMBIVENT) 20-100 MCG/ACT AERS respimat Inhale 1 puff into the lungs every 6 (six) hours. Patient not taking: Reported on 01/15/2019 11/30/18   Palumbo, April, MD  lisinopril (ZESTRIL) 10 MG tablet Take 1 tablet (10 mg total) by mouth daily. Patient not taking: Reported on 01/15/2019 11/26/18   Daleen Bo, MD  potassium chloride SA (K-DUR) 20 MEQ tablet Take 1 tablet (20 mEq total) by mouth 2 (two) times daily. Patient not taking:  Reported on 01/15/2019 11/30/18   Randal Buba, April, MD    Family History Family History  Problem Relation Age of Onset   Hypertension Other    Diabetes Other     Social History Social History   Tobacco Use   Smoking status: Current Every Day Smoker    Packs/day: 1.00    Years: 20.00    Pack years: 20.00    Types: Cigarettes   Smokeless tobacco: Current User  Substance Use Topics   Alcohol use: Yes    Comment: Daily Drinker    Drug use: Yes    Frequency: 7.0 times per week    Types: "Crack" cocaine, Cocaine, Marijuana    Comment: Crack     Allergies   Haldol [haloperidol]   Review of Systems Review of Systems  All other systems reviewed and are negative.    Physical Exam Updated Vital Signs BP (!) 155/89 (BP Location: Right Arm)    Pulse (!) 102    Temp 98.5 F (36.9 C)    SpO2 97%   Physical Exam Vitals signs and nursing note reviewed.  Constitutional:      General: He is  not in acute distress.    Appearance: He is well-developed.  HENT:     Head: Normocephalic and atraumatic.     Mouth/Throat:     Mouth: Mucous membranes are moist.  Eyes:     Conjunctiva/sclera: Conjunctivae normal.     Pupils: Pupils are equal, round, and reactive to light.  Neck:     Musculoskeletal: Normal range of motion and neck supple.  Cardiovascular:     Rate and Rhythm: Regular rhythm. Tachycardia present.     Pulses: Normal pulses.     Heart sounds: No murmur.  Pulmonary:     Effort: Pulmonary effort is normal. No respiratory distress.     Breath sounds: Normal breath sounds. No wheezing or rales.  Abdominal:     General: Bowel sounds are normal. There is distension.     Palpations: Abdomen is soft.     Tenderness: There is no abdominal tenderness. There is no guarding or rebound.     Hernia: A hernia is present. Hernia is present in the umbilical area.     Comments: Distention and fluid in the abdomen.  Easily reducible hernia  Genitourinary:    Comments:  Significant tense swelling to the penis and the scrotum.  1 x 1 cm ulcerative lesion on the left side of the penis without surrounding erythema or drainage Musculoskeletal: Normal range of motion.        General: No tenderness.     Right lower leg: Edema present.     Left lower leg: Edema present.     Comments: Tense edema up to the thighs bilaterally  Skin:    General: Skin is warm and dry.     Findings: No erythema or rash.  Neurological:     Mental Status: He is alert and oriented to person, place, and time. Mental status is at baseline.  Psychiatric:     Comments: Pt is calm and cooperative      ED Treatments / Results  Labs (all labs ordered are listed, but only abnormal results are displayed) Labs Reviewed  CBG MONITORING, ED - Abnormal; Notable for the following components:      Result Value   Glucose-Capillary 317 (*)    All other components within normal limits  RPR  HIV ANTIBODY (ROUTINE TESTING W REFLEX)  GC/CHLAMYDIA PROBE AMP ([place]) NOT AT [facility]    EKG None  Radiology Dg Chest Port 1 View  Result Date: 02/05/2019 CLINICAL DATA:  Shortness of breath EXAM: PORTABLE CHEST 1 VIEW COMPARISON:  February 04, 2019 FINDINGS: There is cardiomegaly with pulmonary venous hypertension. There is atelectatic change in the left mid lung region. There is no frank edema or consolidation. There is a small left pleural effusion. There is aortic atherosclerosis. No adenopathy. No bone lesions. IMPRESSION: Cardiomegaly with pulmonary vascular congestion. Small left pleural effusion. Stable atelectasis left mid lung. No frank edema or consolidation. Aortic Atherosclerosis (ICD10-I70.0). Electronically Signed   By: Bretta BangWilliam  Woodruff III M.D.   On: 02/05/2019 08:17    Procedures Procedures (including critical care time)  Medications Ordered in ED Medications  acetaminophen (TYLENOL) tablet 1,000 mg (has no administration in time range)  furosemide (LASIX) tablet 40 mg (has no  administration in time range)  glipiZIDE (GLUCOTROL) tablet 5 mg (has no administration in time range)  lisinopril (ZESTRIL) tablet 10 mg (has no administration in time range)     Initial Impression / Assessment and Plan / ED Course  I have reviewed the triage vital signs  and the nursing notes.  Pertinent labs & imaging results that were available during my care of the patient were reviewed by me and considered in my medical decision making (see chart for details).        Patient is a 58 year old male well-known to the emergency room who is here again complaining of his chronic issues.  Patient has a chronic wound to the left side of the penis without signs of infection.  He is also complaining of some hesitancy with urinating.  Patient had a urine done less than 1 week ago that showed no sign of infection.  He states he is not sexually active however we will do an HIV, RPR and GC and chlamydia.  Patient symptoms seem mostly related to fluid overload.  He was seen yesterday and labs are at baseline.  Patient's oxygen saturation today is 97% on room air.  Will place wound care to the ulcer which based on notes and reports is not new.  No signs of Fournier's gangrene or scrotal abscess.  Patient given a dose of his home medications of Lasix, lisinopril, glipizide and some Tylenol for the pain.  9:17 AM Patient is eating and drinking without difficulty.  Blood sugar is 317 which is not significantly different from his baseline.  Patient does not appear to have any acute medical condition today requiring admission.  Patient will be discharged and encouraged [facility] and other outpatient resources.  Final Clinical Impressions(s) / ED Diagnoses   Final diagnoses:  Lower extremity edema  Penile lesion    ED Discharge Orders    None       Gwyneth SproutPlunkett, Whittney Steenson, MD 02/06/19 [phone]

## 2019-02-06 NOTE — Discharge Instructions (Signed)
You need to take your medicines and the [facility] can help you.  The fluid is causing the pain in your legs and swelling.  Also your diabetes is causing the pain in your legs also.

## 2019-02-06 NOTE — ED Triage Notes (Signed)
Pt arrives with c/o bilateral leg swelling and pain "like my neuropathy". No acute distress no chest pain

## 2019-02-06 NOTE — ED Notes (Signed)
Vaseline dressing applied to wound on penis per Dr. Maryan Rued; pt given [place] sandwich and beverage

## 2019-02-06 NOTE — ED Notes (Signed)
Patient verbalizes understanding of discharge instructions. Opportunity for questioning and answers were provided. Pt discharged from ED. 

## 2019-02-07 ENCOUNTER — Emergency Department (HOSPITAL_COMMUNITY)
Admission: EM | Admit: 2019-02-07 | Discharge: 2019-02-07 | Disposition: A | Discharge status: Home / Self Care | Payer: Medicaid Other | Attending: Emergency Medicine | Admitting: Emergency Medicine

## 2019-02-07 ENCOUNTER — Encounter (HOSPITAL_COMMUNITY): Payer: Self-pay | Admitting: *Deleted

## 2019-02-07 ENCOUNTER — Other Ambulatory Visit: Payer: Self-pay

## 2019-02-07 DIAGNOSIS — Z79899 Other long term (current) drug therapy: Secondary | ICD-10-CM | POA: Insufficient documentation

## 2019-02-07 DIAGNOSIS — I5042 Chronic combined systolic (congestive) and diastolic (congestive) heart failure: Secondary | ICD-10-CM | POA: Insufficient documentation

## 2019-02-07 DIAGNOSIS — Z9119 Patient's noncompliance with other medical treatment and regimen: Secondary | ICD-10-CM

## 2019-02-07 DIAGNOSIS — Z91199 Patient's noncompliance with other medical treatment and regimen due to unspecified reason: Secondary | ICD-10-CM

## 2019-02-07 DIAGNOSIS — E119 Type 2 diabetes mellitus without complications: Secondary | ICD-10-CM | POA: Insufficient documentation

## 2019-02-07 DIAGNOSIS — I10 Essential (primary) hypertension: Secondary | ICD-10-CM | POA: Insufficient documentation

## 2019-02-07 DIAGNOSIS — Z7984 Long term (current) use of oral hypoglycemic drugs: Secondary | ICD-10-CM | POA: Diagnosis not present

## 2019-02-07 DIAGNOSIS — R109 Unspecified abdominal pain: Secondary | ICD-10-CM | POA: Diagnosis present

## 2019-02-07 DIAGNOSIS — G8929 Other chronic pain: Secondary | ICD-10-CM

## 2019-02-07 DIAGNOSIS — Z590 Homelessness: Secondary | ICD-10-CM | POA: Diagnosis not present

## 2019-02-07 DIAGNOSIS — F17210 Nicotine dependence, cigarettes, uncomplicated: Secondary | ICD-10-CM | POA: Diagnosis not present

## 2019-02-07 DIAGNOSIS — N289 Disorder of kidney and ureter, unspecified: Secondary | ICD-10-CM | POA: Diagnosis not present

## 2019-02-07 LAB — RPR: RPR Ser Ql: NONREACTIVE

## 2019-02-07 LAB — HIV ANTIBODY (ROUTINE TESTING W REFLEX): HIV Screen 4th Generation wRfx: NONREACTIVE

## 2019-02-07 MED ORDER — ACETAMINOPHEN 325 MG PO TABS
650.0000 mg | ORAL_TABLET | Freq: Once | ORAL | Status: AC
  Administered 2019-02-07: 650 mg via ORAL
  Filled 2019-02-07: qty 2

## 2019-02-07 MED ORDER — FUROSEMIDE 20 MG PO TABS
40.0000 mg | ORAL_TABLET | Freq: Once | ORAL | Status: AC
  Administered 2019-02-07: 40 mg via ORAL
  Filled 2019-02-07: qty 2

## 2019-02-07 NOTE — ED Provider Notes (Signed)
[facility]   CSN: [id number] Arrival date & time: 02/07/19  0301     History   Chief Complaint Chief Complaint  Patient presents with  . Abdominal Pain    HPI [name] is a 58 y.o. male.     HPI  58 year old with history of cocaine abuse, neuropathy, homelessness, CHF, medication noncompliance comes in a chief complaint of abdominal distention.  He reports that his abdomen is hurting because his distended.  He his medications as prescribed.  Patient has been admitted twice in the last month, for CHF.  Additionally is complaining of pain and swelling in his leg, which is not new.  Past Medical History:  Diagnosis Date  . Chronic foot pain   . Cocaine abuse (HCC)   . Diabetes mellitus without complication (HCC)   . Hepatitis C    unsure   . Homelessness   . Hypertension   . Neuropathy   . Polysubstance abuse (HCC)   . Schizophrenia (HCC)   . Sleep apnea   . Systolic and diastolic CHF, chronic [facility](HCC)     Patient Active Problem List   Diagnosis Date Noted  . CHF (congestive heart failure), NYHA class III, acute on chronic, combined (HCC) 02/02/2019  . Scrotal edema 01/30/2019  . Combined systolic and diastolic heart failure (HCC) 01/19/2019  . Chest pain 01/16/2019  . 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  . Medically noncompliant   . Acute on chronic systolic CHF (congestive heart failure) (HCC) 01/02/2019  . 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 Acute on chronic systolic heart failure (HCC) 12/26/2018  . Mobitz type II atrioventricular block 12/25/2018  . Acute exacerbation of CHF (congestive heart failure) (HCC) 12/22/2018  . Acute on chronic respiratory failure with hypoxia (HCC) 12/22/2018  . CHF exacerbation (HCC) 12/19/2018  . Acute CHF (congestive heart failure) (HCC) 12/16/2018  . Diabetes mellitus without complication (HCC) 12/11/2018  .  Acute on chronic systolic (congestive) heart failure (HCC) 12/11/2018  . Abdominal pain 12/11/2018  . Nausea vomiting and diarrhea 12/11/2018  . Microcytic anemia 12/11/2018  . Evaluation by psychiatric service required   . MDD (major depressive disorder), severe (HCC) 11/25/2018  . Pressure injury of skin 11/08/2018  . Elevated troponin 10/18/2018  . HLD (hyperlipidemia) 10/18/2018  . Anxiety 10/18/2018  . CHF (congestive heart failure), NYHA class II, acute on chronic, combined (HCC) 10/18/2018  . Hypertensive urgency 10/12/2018  . Mild renal insufficiency 10/12/2018  . Cellulitis 10/12/2018  . Penile cellulitis   . Adjustment disorder with mixed disturbance of emotions and conduct   . Sleep apnea 10/03/2018  . Hypokalemia 09/17/2018  . Scrotal swelling 09/17/2018  . Urinary hesitancy   . Constipation   . Acute on chronic combined systolic and diastolic CHF (congestive heart failure) (HCC) 08/25/2018  . Acute respiratory failure with hypoxia (HCC) 08/25/2018  . Tobacco use 08/18/2018  . Homelessness 08/08/2018  . Smoker 08/08/2018  . Prostate enlargement 03/16/2018  . Aortic atherosclerosis (HCC) 03/16/2018  . 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 Cocaine abuse with cocaine-induced mood disorder (HCC) 09/18/2017  . Chronic foot pain   . Schizoaffective disorder, bipolar type (HCC) 09/30/2016  . Substance induced mood disorder (HCC) 03/13/2015  . Schizophrenia, paranoid type (HCC) 01/17/2015  . Suicidal ideation   . Drug hallucinosis (HCC) 10/08/2014  . Chronic paranoid schizophrenia (HCC) 09/07/2014  . Substance or medication-induced bipolar and related disorder with onset  during intoxication ([place]) 08/10/2014  . Urinary retention   . Cocaine use disorder, severe, dependence ([place])   . Essential hypertension 03/28/2013  . Insulin-requiring or dependent type II diabetes mellitus ([place]) 03/15/2013    Past Surgical History:  Procedure Laterality Date  . MULTIPLE  TOOTH EXTRACTIONS          Home Medications    Prior to Admission medications   Medication Sig Start Date End Date Taking? Authorizing Provider  acetaminophen (TYLENOL) 500 MG tablet Take 1 tablet (500 mg total) by mouth every 6 (six) hours as needed. Patient not taking: Reported on 01/19/2019 01/08/19   Domenic Moras, PA-C  ARIPiprazole (ABILIFY) 10 MG tablet Take 1 tablet (10 mg total) by mouth daily. Patient not taking: Reported on 01/13/2019 12/10/18   Flora Lipps, MD  carbamazepine (TEGRETOL) 200 MG tablet Take 0.5 tablets (100 mg total) by mouth 2 (two) times daily. Patient not taking: Reported on 01/15/2019 11/26/18   Daleen Bo, MD  furosemide (LASIX) 40 MG tablet Take 1 tablet (40 mg total) by mouth 2 (two) times daily. 01/29/19 02/28/19  Dorie Rank, MD  furosemide (LASIX) 40 MG tablet Take 1 tablet (40 mg total) by mouth 2 (two) times daily. Patient not taking: Reported on 02/05/2019 02/04/19   Tegeler, Gwenyth Allegra, MD  glipiZIDE (GLUCOTROL) 5 MG tablet Take 0.5 tablets (2.5 mg total) by mouth 2 (two) times daily before a meal. 01/01/19   Rai, Ripudeep K, MD  Ipratropium-Albuterol (COMBIVENT) 20-100 MCG/ACT AERS respimat Inhale 1 puff into the lungs every 6 (six) hours. Patient not taking: Reported on 01/15/2019 11/30/18   Palumbo, April, MD  lisinopril (ZESTRIL) 10 MG tablet Take 1 tablet (10 mg total) by mouth daily. Patient not taking: Reported on 01/15/2019 11/26/18   Daleen Bo, MD  potassium chloride SA (K-DUR) 20 MEQ tablet Take 1 tablet (20 mEq total) by mouth 2 (two) times daily. Patient not taking: Reported on 01/15/2019 11/30/18   Randal Buba, April, MD    Family History Family History  Problem Relation Age of Onset  . Hypertension Other   . Diabetes Other     Social History Social History   Tobacco Use  . Smoking status: Current Every Day Smoker    Packs/day: 1.00    Years: 20.00    Pack years: 20.00    Types: Cigarettes  . Smokeless tobacco: Current User   Substance Use Topics  . Alcohol use: Yes    Comment: Daily Drinker   . Drug use: Yes    Frequency: 7.0 times per week    Types: "Crack" cocaine, Cocaine, Marijuana    Comment: Crack     Allergies   Haldol [haloperidol]   Review of Systems Review of Systems  Constitutional: Positive for activity change.  Respiratory: Positive for shortness of breath.   Gastrointestinal: Positive for abdominal pain.  Skin: Negative for rash.     Physical Exam Updated Vital Signs BP (!) 174/112   Pulse (!) 108   Temp 99 F (37.2 C) (Oral)   Resp 20   SpO2 (!) 89%   Physical Exam Vitals signs and nursing note reviewed.  Constitutional:      Appearance: He is well-developed.  HENT:     Head: Atraumatic.  Neck:     Musculoskeletal: Neck supple.  Cardiovascular:     Rate and Rhythm: Normal rate.  Pulmonary:     Effort: Pulmonary effort is normal.  Abdominal:     General: There is distension.  Tenderness: There is no guarding or rebound.  Skin:    General: Skin is warm.  Neurological:     Mental Status: He is alert and oriented to person, place, and time.      ED Treatments / Results  Labs (all labs ordered are listed, but only abnormal results are displayed) Labs Reviewed - No data to display  EKG None  Radiology Dg Chest [facility] [place] 1 [facility]  Result Date: 02/05/2019 CLINICAL DATA:  Shortness of breath EXAM: PORTABLE CHEST 1 VIEW COMPARISON:  February 04, 2019 FINDINGS: There is cardiomegaly with pulmonary venous hypertension. There is atelectatic change in the left mid lung region. There is no frank edema or consolidation. There is a small left pleural effusion. There is aortic atherosclerosis. No adenopathy. No bone lesions. IMPRESSION: Cardiomegaly with pulmonary vascular congestion. Small left pleural effusion. Stable atelectasis left mid lung. No frank edema or consolidation. Aortic Atherosclerosis (ICD10-I70.0). Electronically Signed   By: Bretta BangWilliam  Woodruff III M.D.   On:  02/05/2019 08:17    Procedures Procedures (including critical care time)  Medications Ordered in ED Medications  furosemide (LASIX) tablet 40 mg (40 mg Oral Given 02/07/19 0635)  acetaminophen (TYLENOL) tablet 650 mg (650 mg Oral Given 02/07/19 [id number])     Initial Impression / Assessment and Plan / ED Course  I have reviewed the triage vital signs and the nursing notes.  Pertinent labs & imaging results that were available during my care of the patient were reviewed by me and considered in my medical decision making (see chart for details).        Patient comes in a chief complaint of leg swelling, abdominal discomfort.  He has history of polysubstance abuse, CHF, noncompliance.  He is also frequent ED utilizer.  He is noted to have abdominal distention without any focal tenderness. He has pitting edema that is not new.  Patient will be given Lasix and Tylenol as per his request.  Stable for discharge.  Final Clinical Impressions(s) / ED Diagnoses   Final diagnoses:  Chronic combined systolic and diastolic congestive heart failure (HCC)  Other chronic pain  Noncompliance    ED Discharge Orders    None       Derwood KaplanNanavati, Denson Niccoli, MD 02/07/19 0700

## 2019-02-07 NOTE — ED Notes (Signed)
Discharge instructions discussed with pt. Pt verbalized understanding. Pt stable and ambulatory. No signature pad available. 

## 2019-02-07 NOTE — ED Notes (Signed)
Pt being verbally abusive towards staff during discharge. Pt refusing to leave. [employer] assisting staff on having pt leave facility.

## 2019-02-07 NOTE — Discharge Instructions (Addendum)
Please go to [facility] so that they can help manage your chronic medical conditions.  They also have social worker who can further help with housing. You have been provided list of shelters in the past.

## 2019-02-07 NOTE — ED Triage Notes (Signed)
Pt arrived by [facility] for abd pain and distention, also has leg pain from neuropathy. No acute distress noted on arrival.

## 2019-02-08 ENCOUNTER — Other Ambulatory Visit: Payer: Self-pay

## 2019-02-08 ENCOUNTER — Emergency Department (HOSPITAL_COMMUNITY)
Admission: EM | Admit: 2019-02-08 | Discharge: 2019-02-09 | Disposition: A | Discharge status: Home / Self Care | Payer: Medicaid Other | Attending: Emergency Medicine | Admitting: Emergency Medicine

## 2019-02-08 ENCOUNTER — Encounter (HOSPITAL_COMMUNITY): Payer: Self-pay

## 2019-02-08 DIAGNOSIS — F17210 Nicotine dependence, cigarettes, uncomplicated: Secondary | ICD-10-CM | POA: Insufficient documentation

## 2019-02-08 DIAGNOSIS — F17220 Nicotine dependence, chewing tobacco, uncomplicated: Secondary | ICD-10-CM | POA: Insufficient documentation

## 2019-02-08 DIAGNOSIS — Z7984 Long term (current) use of oral hypoglycemic drugs: Secondary | ICD-10-CM | POA: Diagnosis not present

## 2019-02-08 DIAGNOSIS — I5042 Chronic combined systolic (congestive) and diastolic (congestive) heart failure: Secondary | ICD-10-CM | POA: Diagnosis not present

## 2019-02-08 DIAGNOSIS — Z590 Homelessness: Secondary | ICD-10-CM | POA: Insufficient documentation

## 2019-02-08 DIAGNOSIS — I110 Hypertensive heart disease with heart failure: Secondary | ICD-10-CM | POA: Diagnosis not present

## 2019-02-08 DIAGNOSIS — R0602 Shortness of breath: Secondary | ICD-10-CM | POA: Diagnosis present

## 2019-02-08 DIAGNOSIS — R609 Edema, unspecified: Secondary | ICD-10-CM

## 2019-02-08 DIAGNOSIS — E119 Type 2 diabetes mellitus without complications: Secondary | ICD-10-CM | POA: Diagnosis not present

## 2019-02-08 DIAGNOSIS — Z9119 Patient's noncompliance with other medical treatment and regimen: Secondary | ICD-10-CM | POA: Insufficient documentation

## 2019-02-08 DIAGNOSIS — R600 Localized edema: Secondary | ICD-10-CM | POA: Diagnosis not present

## 2019-02-08 DIAGNOSIS — Z79899 Other long term (current) drug therapy: Secondary | ICD-10-CM | POA: Diagnosis not present

## 2019-02-08 NOTE — ED Triage Notes (Signed)
[name] was just [facility] the jail and reported shortness of breath. EMS was called and he was transferred here. Pt reports SOB. Audible wheezing, pt appears in usual state of health on arrival. Pt screaming in full sentences at this RN in no acute respiratory distress.

## 2019-02-09 ENCOUNTER — Other Ambulatory Visit: Payer: Self-pay

## 2019-02-09 ENCOUNTER — Emergency Department (HOSPITAL_COMMUNITY)
Admission: EM | Admit: 2019-02-09 | Discharge: 2019-02-10 | Disposition: A | Discharge status: Home / Self Care | Payer: Medicaid Other | Source: Home / Self Care | Attending: Emergency Medicine | Admitting: Emergency Medicine

## 2019-02-09 ENCOUNTER — Encounter (HOSPITAL_COMMUNITY): Payer: Self-pay | Admitting: *Deleted

## 2019-02-09 DIAGNOSIS — E119 Type 2 diabetes mellitus without complications: Secondary | ICD-10-CM | POA: Insufficient documentation

## 2019-02-09 DIAGNOSIS — Z79899 Other long term (current) drug therapy: Secondary | ICD-10-CM | POA: Insufficient documentation

## 2019-02-09 DIAGNOSIS — F17220 Nicotine dependence, chewing tobacco, uncomplicated: Secondary | ICD-10-CM | POA: Insufficient documentation

## 2019-02-09 DIAGNOSIS — F17210 Nicotine dependence, cigarettes, uncomplicated: Secondary | ICD-10-CM | POA: Insufficient documentation

## 2019-02-09 DIAGNOSIS — Z5900 Homelessness unspecified: Secondary | ICD-10-CM

## 2019-02-09 DIAGNOSIS — R0602 Shortness of breath: Secondary | ICD-10-CM

## 2019-02-09 DIAGNOSIS — Z7984 Long term (current) use of oral hypoglycemic drugs: Secondary | ICD-10-CM | POA: Insufficient documentation

## 2019-02-09 DIAGNOSIS — I110 Hypertensive heart disease with heart failure: Secondary | ICD-10-CM | POA: Insufficient documentation

## 2019-02-09 DIAGNOSIS — I5042 Chronic combined systolic (congestive) and diastolic (congestive) heart failure: Secondary | ICD-10-CM | POA: Insufficient documentation

## 2019-02-09 LAB — GC/CHLAMYDIA PROBE AMP (~~LOC~~) NOT AT ARMC
Chlamydia: NEGATIVE
Neisseria Gonorrhea: NEGATIVE

## 2019-02-09 IMAGING — CR DG CHEST 2V
2 series · 2 of 2 positions shown · non-contrast
Comparison: 07/24/2018 and 07/15/2018

CLINICAL DATA: Chest pain and shortness of breath and cough.

EXAM:
CHEST - 2 VIEW

[chest lat]
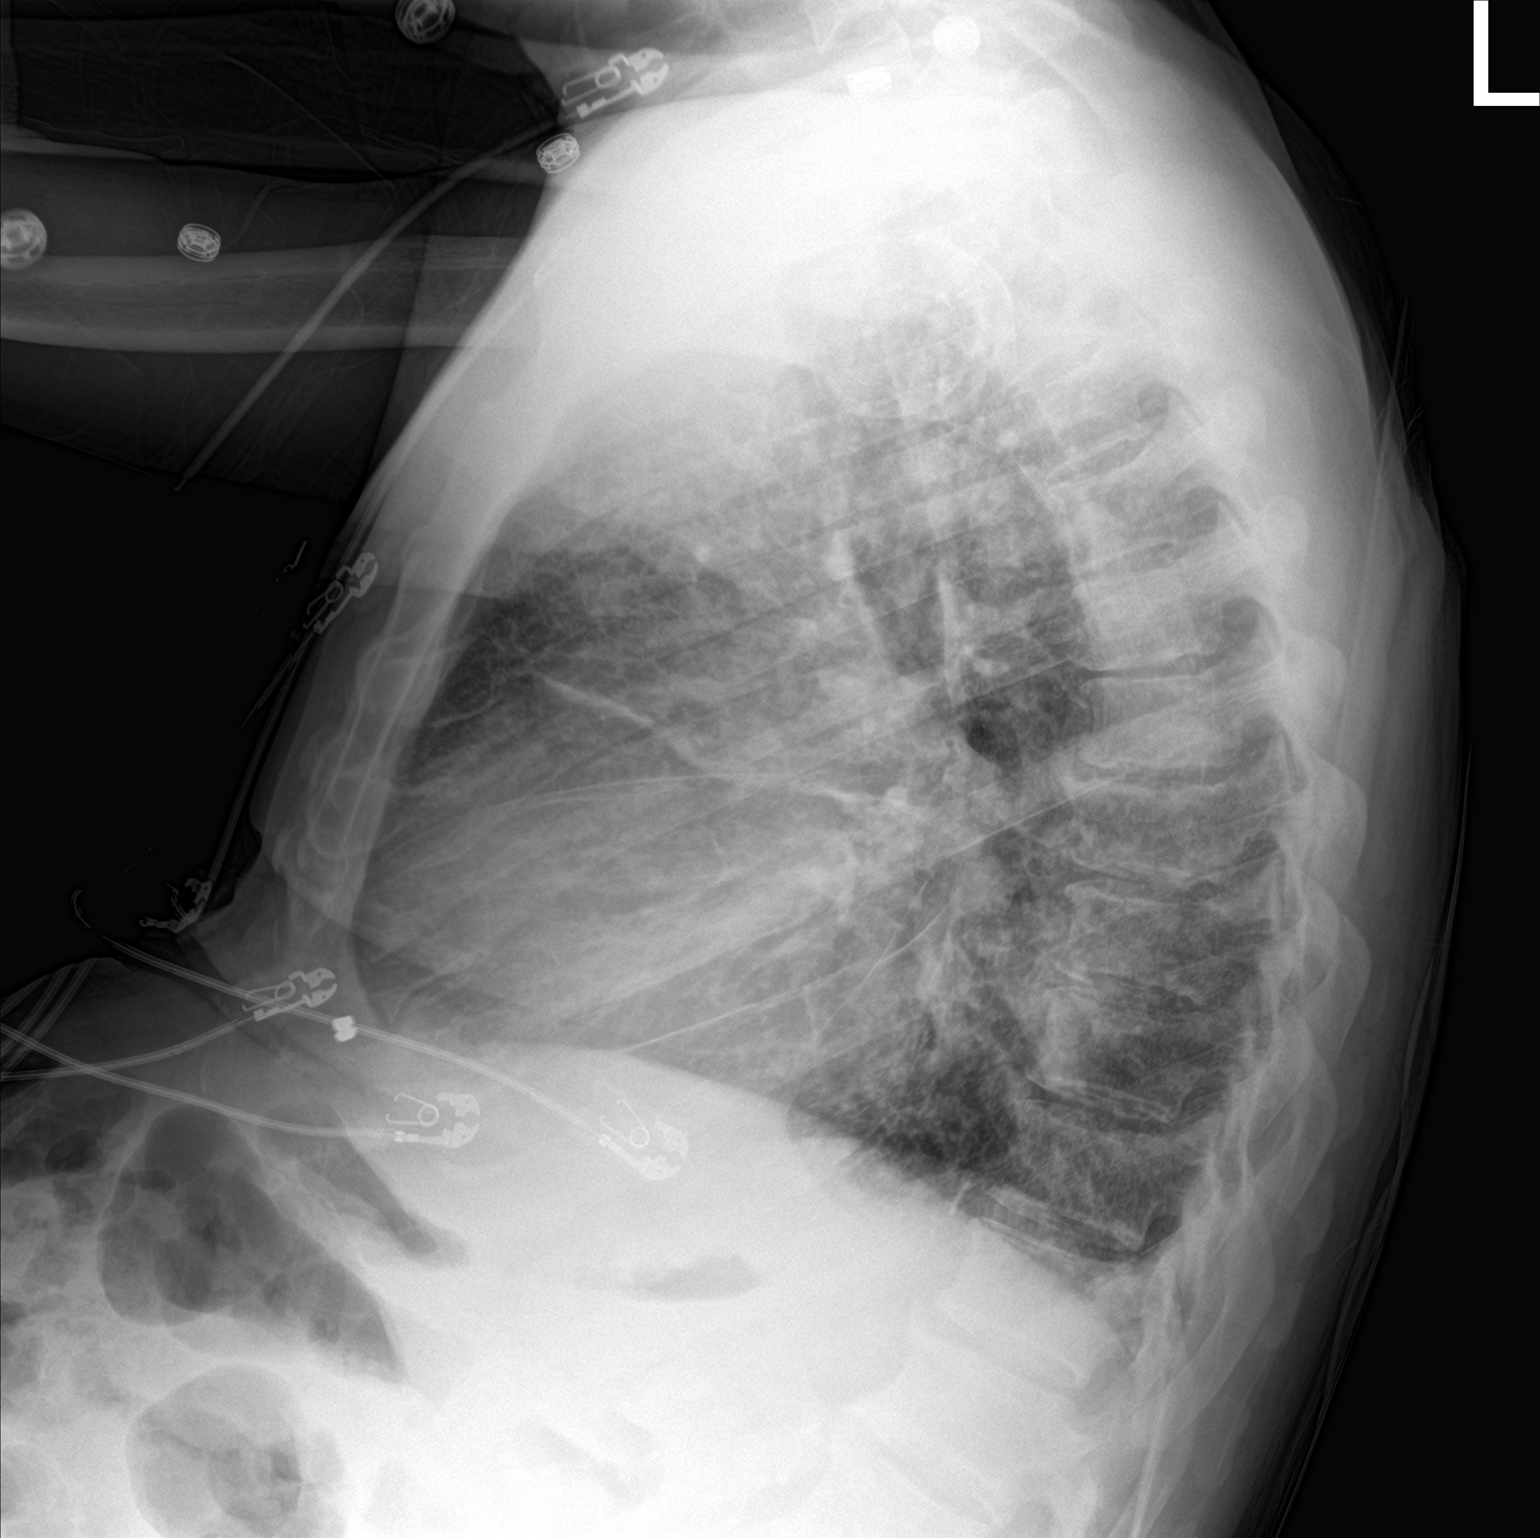

[chest ap]
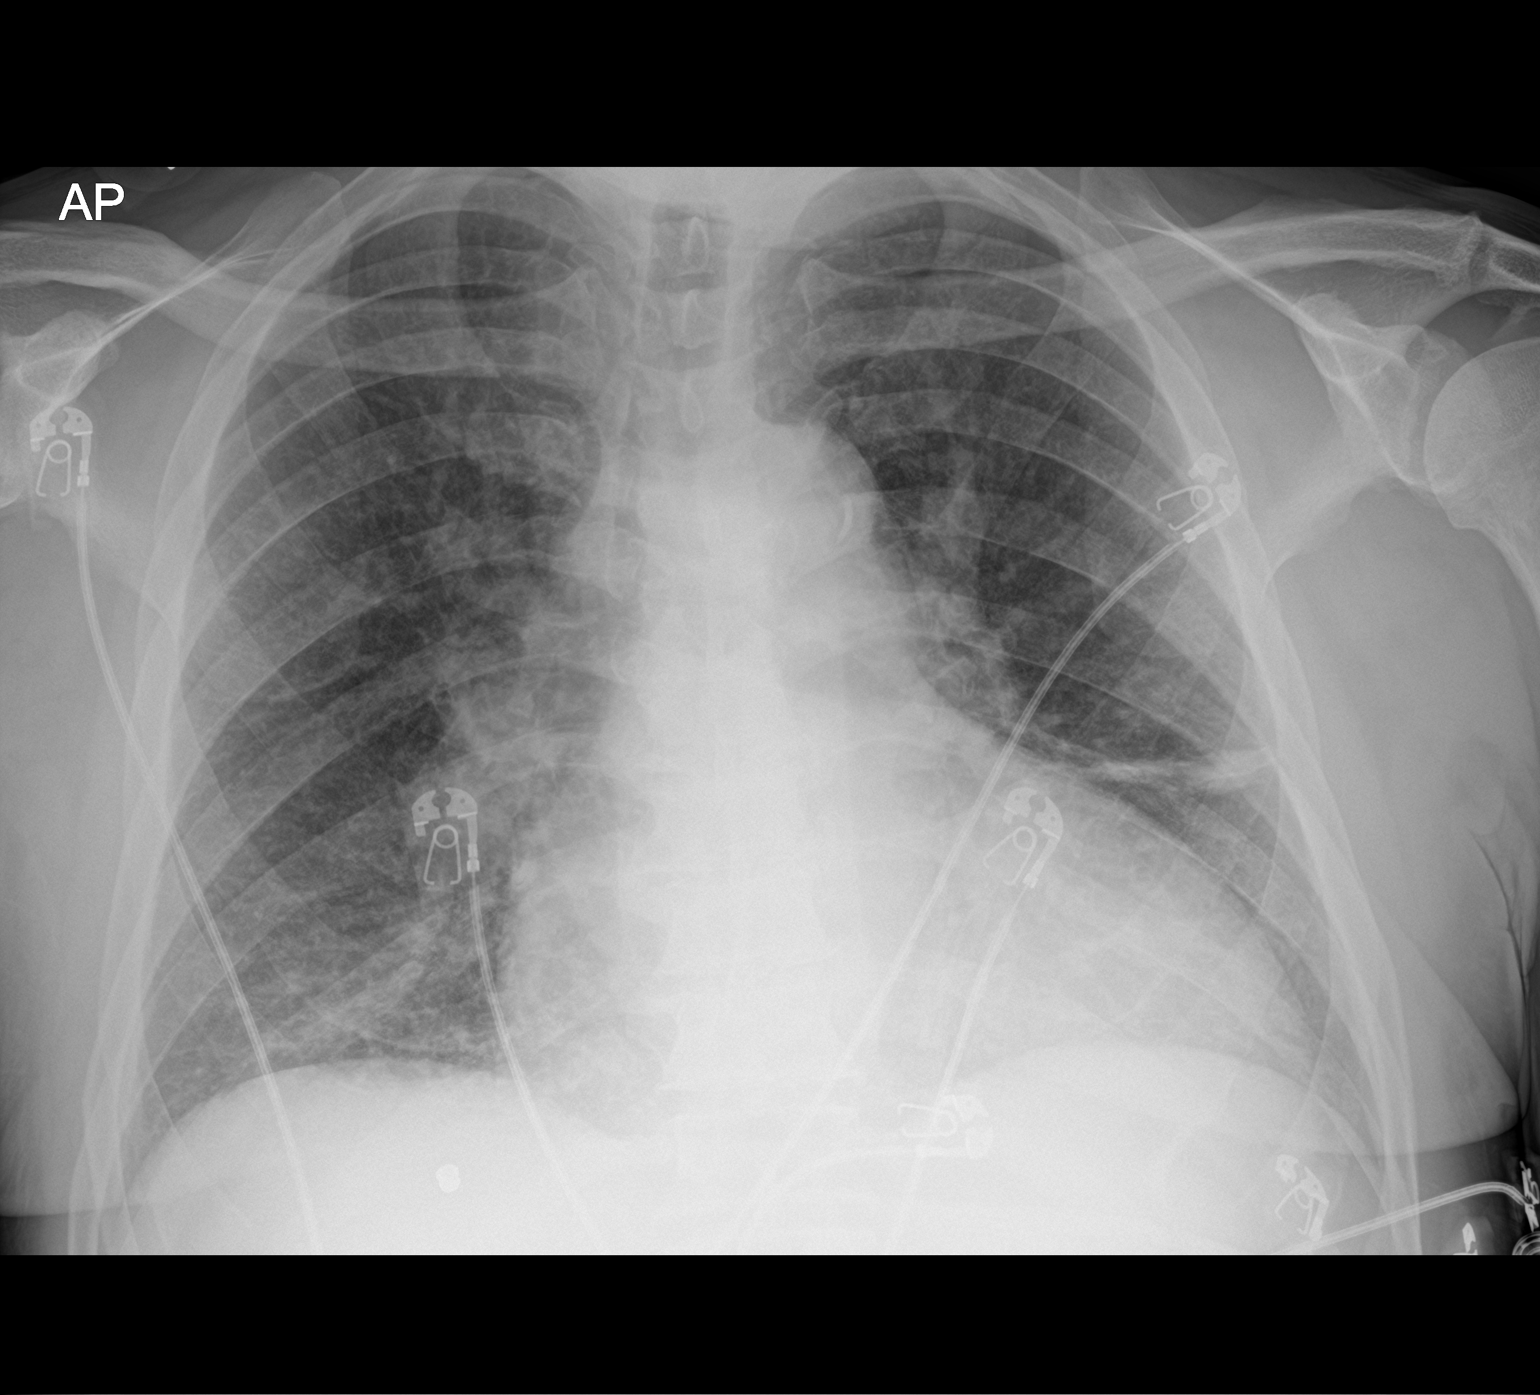

[2 of 2 positions shown; findings below may reference images not displayed]

FINDINGS: The patient has developed new mild cardiomegaly even considering the
AP technique. There is new mild pulmonary vascular congestion. There
is a new slight accentuation of the interstitial markings with new
fluid along the fissures as well as increased atelectasis in the
region of the lingula. No discrete effusions. No acute bone
abnormality.

Incidental note is made of a metal pellet in the soft tissues in the
anterior aspect of the lower right chest, unchanged. Slight aortic
atherosclerosis.
IMPRESSION: Findings consistent with mild congestive heart failure.

## 2019-02-09 MED ORDER — ACETAMINOPHEN 325 MG PO TABS
650.0000 mg | ORAL_TABLET | Freq: Once | ORAL | Status: AC
  Administered 2019-02-09: 05:00:00 650 mg via ORAL
  Filled 2019-02-09: qty 2

## 2019-02-09 MED ORDER — FUROSEMIDE 20 MG PO TABS
40.0000 mg | ORAL_TABLET | Freq: Once | ORAL | Status: AC
  Administered 2019-02-09: 05:00:00 40 mg via ORAL
  Filled 2019-02-09: qty 2

## 2019-02-09 NOTE — ED Notes (Signed)
Patient is sleeping in lobby.

## 2019-02-09 NOTE — ED Triage Notes (Addendum)
Pt arrived by EMS for sob and scrotal swelling. RA sats 84%, EMS placed pt on 10L NRB, sats improved to 100s. Current room air sats are 97% on RA

## 2019-02-09 NOTE — ED Provider Notes (Signed)
[facility]ENC[facility] Provider Note   CSN: [id number] Arrival date & time: 02/08/19  1745     History   Chief Complaint Chief Complaint  Patient presents with  . Shortness of Breath    HPI [name] is a 58 y.o. male.     Patient to ED with complaint of SOB and groin swelling. No fever, cough, congestion. He was reportedly just released from jail, per EMS, and was transported because of complaint of SoB. The patient states that his groin swelling causes pain. No urinary symptoms, diarrhea.   The history is provided by the patient. No language interpreter was used.  Shortness of Breath Associated symptoms: no abdominal pain, no chest pain, no cough and no fever     Past Medical History:  Diagnosis Date  . Chronic foot pain   . Cocaine abuse (HCC)   . Diabetes mellitus without complication (HCC)   . Hepatitis C    unsure   . Homelessness   . Hypertension   . Neuropathy   . Polysubstance abuse (HCC)   . Schizophrenia (HCC)   . Sleep apnea   . Systolic and diastolic CHF, chronic [facility](HCC)     Patient Active Problem List   Diagnosis Date Noted  . CHF (congestive heart failure), NYHA class III, acute on chronic, combined (HCC) 02/02/2019  . Scrotal edema 01/30/2019  . Combined systolic and diastolic heart failure (HCC) 01/19/2019  . Chest pain 01/16/2019  . 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  . Medically noncompliant   . Acute on chronic systolic CHF (congestive heart failure) (HCC) 01/02/2019  . 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 Acute on chronic systolic heart failure (HCC) 12/26/2018  . Mobitz type II atrioventricular block 12/25/2018  . Acute exacerbation of CHF (congestive heart failure) (HCC) 12/22/2018  . Acute on chronic respiratory failure with hypoxia (HCC) 12/22/2018  . CHF exacerbation (HCC) 12/19/2018  . Acute CHF (congestive heart failure) (HCC) 12/16/2018   . Diabetes mellitus without complication (HCC) 12/11/2018  . Acute on chronic systolic (congestive) heart failure (HCC) 12/11/2018  . Abdominal pain 12/11/2018  . Nausea vomiting and diarrhea 12/11/2018  . Microcytic anemia 12/11/2018  . Evaluation by psychiatric service required   . MDD (major depressive disorder), severe (HCC) 11/25/2018  . Pressure injury of skin 11/08/2018  . Elevated troponin 10/18/2018  . HLD (hyperlipidemia) 10/18/2018  . Anxiety 10/18/2018  . CHF (congestive heart failure), NYHA class II, acute on chronic, combined (HCC) 10/18/2018  . Hypertensive urgency 10/12/2018  . Mild renal insufficiency 10/12/2018  . Cellulitis 10/12/2018  . Penile cellulitis   . Adjustment disorder with mixed disturbance of emotions and conduct   . Sleep apnea 10/03/2018  . Hypokalemia 09/17/2018  . Scrotal swelling 09/17/2018  . Urinary hesitancy   . Constipation   . Acute on chronic combined systolic and diastolic CHF (congestive heart failure) (HCC) 08/25/2018  . Acute respiratory failure with hypoxia (HCC) 08/25/2018  . Tobacco use 08/18/2018  . Homelessness 08/08/2018  . Smoker 08/08/2018  . Prostate enlargement 03/16/2018  . Aortic atherosclerosis (HCC) 03/16/2018  . 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 Cocaine abuse with cocaine-induced mood disorder (HCC) 09/18/2017  . Chronic foot pain   . Schizoaffective disorder, bipolar type (HCC) 09/30/2016  . Substance induced mood disorder (HCC) 03/13/2015  . Schizophrenia, paranoid type (HCC) 01/17/2015  . Suicidal ideation   . Drug hallucinosis (HCC) 10/08/2014  . Chronic paranoid  schizophrenia (HCC) 09/07/2014  . 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 Urinary retention   . Cocaine use disorder, severe, dependence (HCC)   . Essential hypertension 03/28/2013  . Insulin-requiring or dependent type II diabetes mellitus (HCC) 03/15/2013     Past Surgical History:  Procedure Laterality Date  . MULTIPLE TOOTH EXTRACTIONS          Home Medications    Prior to Admission medications   Medication Sig Start Date End Date Taking? Authorizing Provider  acetaminophen (TYLENOL) 500 MG tablet Take 1 tablet (500 mg total) by mouth every 6 (six) hours as needed. Patient not taking: Reported on 01/19/2019 01/08/19   Fayrene Helperran, [place], PA-C  ARIPiprazole (ABILIFY) 10 MG tablet Take 1 tablet (10 mg total) by mouth daily. Patient not taking: Reported on 01/13/2019 12/10/18   Joycelyn DasPokhrel, Laxman, MD  carbamazepine (TEGRETOL) 200 MG tablet Take 0.5 tablets (100 mg total) by mouth 2 (two) times daily. Patient not taking: Reported on 01/15/2019 11/26/18   Mancel BaleWentz, Elliott, MD  furosemide (LASIX) 40 MG tablet Take 1 tablet (40 mg total) by mouth 2 (two) times daily. 01/29/19 02/28/19  Linwood DibblesKnapp, Jon, MD  furosemide (LASIX) 40 MG tablet Take 1 tablet (40 mg total) by mouth 2 (two) times daily. Patient not taking: Reported on 02/05/2019 02/04/19   Tegeler, Canary Brimhristopher J, MD  glipiZIDE (GLUCOTROL) 5 MG tablet Take 0.5 tablets (2.5 mg total) by mouth 2 (two) times daily before a meal. 01/01/19   Rai, Ripudeep K, MD  Ipratropium-Albuterol (COMBIVENT) 20-100 MCG/ACT AERS respimat Inhale 1 puff into the lungs every 6 (six) hours. Patient not taking: Reported on 01/15/2019 11/30/18   Palumbo, April, MD  lisinopril (ZESTRIL) 10 MG tablet Take 1 tablet (10 mg total) by mouth daily. Patient not taking: Reported on 01/15/2019 11/26/18   Mancel BaleWentz, Elliott, MD  potassium chloride SA (K-DUR) 20 MEQ tablet Take 1 tablet (20 mEq total) by mouth 2 (two) times daily. Patient not taking: Reported on 01/15/2019 11/30/18   Nicanor AlconPalumbo, April, MD    Family History Family History  Problem Relation Age of Onset  . Hypertension Other   . Diabetes Other     Social History Social History   Tobacco Use  . Smoking status: Current Every Day Smoker    Packs/day: 1.00    Years: 20.00    Pack years: 20.00     Types: Cigarettes  . Smokeless tobacco: Current User  Substance Use Topics  . Alcohol use: Yes    Comment: Daily Drinker   . Drug use: Yes    Frequency: 7.0 times per week    Types: "Crack" cocaine, Cocaine, Marijuana    Comment: Crack     Allergies   Haldol [haloperidol]   Review of Systems Review of Systems  Constitutional: Negative for fever.  HENT: Negative.   Respiratory: Positive for shortness of breath. Negative for cough.   Cardiovascular: Negative for chest pain.  Gastrointestinal: Negative for abdominal pain.  Genitourinary:       C/O groin swelling  Skin: Negative for color change.     Physical Exam Updated Vital Signs BP (!) 159/115 (BP Location: Right Arm)   Pulse 100   Temp 98.6 F (37 C) (Oral)   Resp 20   Ht 5\' 9"  (1.753 m)   Wt 107.3 kg   SpO2 94%   BMI 34.93 kg/m   Physical Exam Constitutional:      General: He is not in acute  distress.    Appearance: He is well-developed. He is not toxic-appearing.  HENT:     Mouth/Throat:     Mouth: Mucous membranes are moist.  Neck:     Musculoskeletal: Normal range of motion and neck supple.  Cardiovascular:     Rate and Rhythm: Normal rate and regular rhythm.     Heart sounds: No murmur.  Pulmonary:     Effort: Pulmonary effort is normal.     Breath sounds: Rales present. No wheezing or rhonchi.  Chest:     Chest wall: No tenderness.  Abdominal:     Tenderness: There is no abdominal tenderness. There is no guarding.  Genitourinary:    Comments: Scrotal swelling c/w history/chronic swelling.  Musculoskeletal:     Right lower leg: Edema present.     Left lower leg: Edema present.     Comments: Chronic LE edema.  Neurological:     Mental Status: He is alert.      ED Treatments / Results  Labs (all labs ordered are listed, but only abnormal results are displayed) Labs Reviewed - No data to display  EKG None  Radiology No results found.  Procedures Procedures (including critical  care time)  Medications Ordered in ED Medications  acetaminophen (TYLENOL) tablet 650 mg (has no administration in time range)  furosemide (LASIX) tablet 40 mg (has no administration in time range)     Initial Impression / Assessment and Plan / ED Course  I have reviewed the triage vital signs and the nursing notes.  Pertinent labs & imaging results that were available during my care of the patient were reviewed by me and considered in my medical decision making (see chart for details).        Patient to ED with c/o SOB. He also complains of groin swelling. Both issues are chronic.   He is ambulatory in the department without observed dyspnea. He is speaking in long sentences without difficulty. On arrival to the bed, he requests Tylenol and a sandwich.   The patient is well known to the ED with 106 visits over the last 6 months. He is not found to have a changed exam. He is not hypoxic and not observed to be SOB with rest or ambulation.   He has chronic swelling and is chronically noncompliant with medications. Will dose Lasix in the ED and encourage PCP follow up. He can be discharged home.   Final Clinical Impressions(s) / ED Diagnoses   Final diagnoses:  None   1. Chronic edema  ED Discharge Orders    None       Charlann Lange, PA-C 02/09/19 0331    Maudie Flakes, MD 02/09/19 [phone]

## 2019-02-09 NOTE — Discharge Instructions (Addendum)
Please follow up with your doctor for recheck of current complaints tomorrow.

## 2019-02-10 ENCOUNTER — Emergency Department (HOSPITAL_COMMUNITY): Payer: Medicaid Other

## 2019-02-10 IMAGING — CR DG CHEST 2V
2 series · 2 of 2 positions shown · non-contrast
Comparison: 07/29/2018, 07/24/2018

CLINICAL DATA: Fever

EXAM:
CHEST - 2 VIEW

[chest lat]
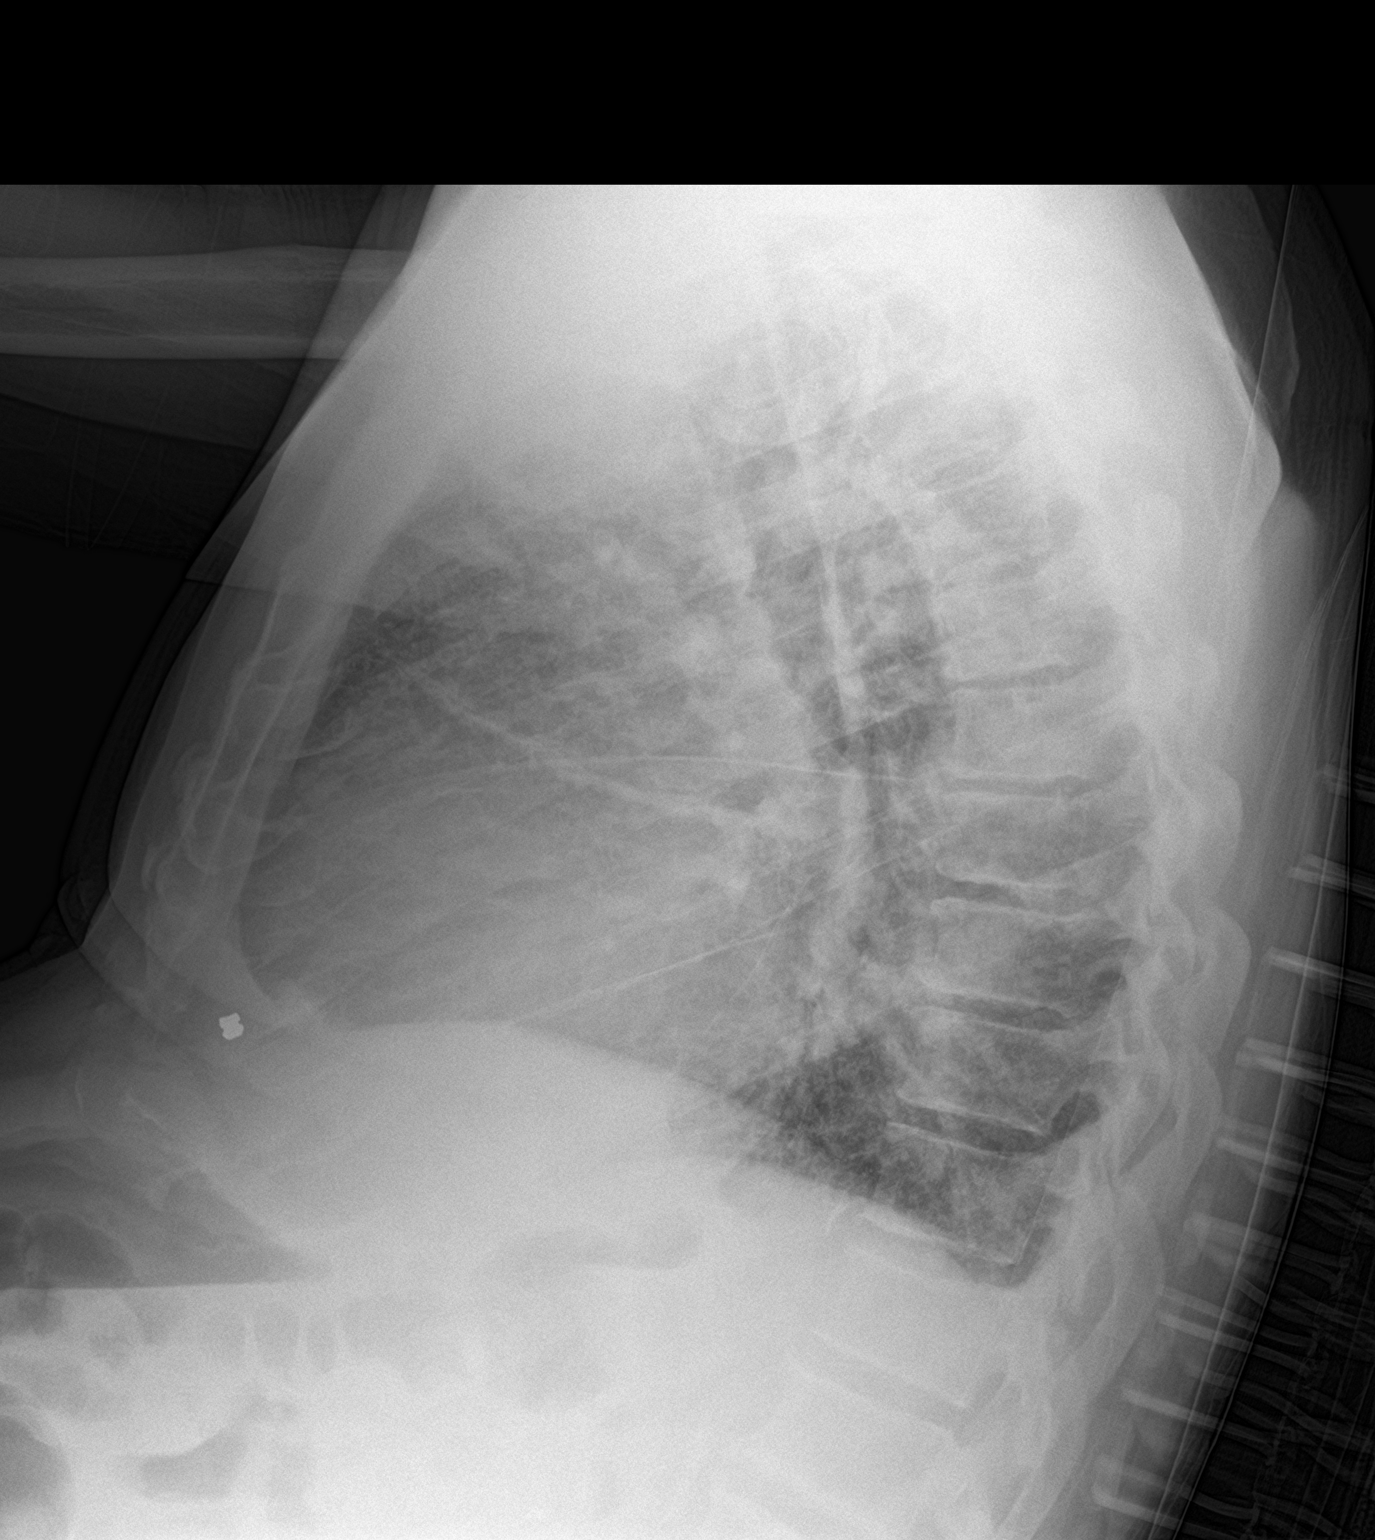

[chest ap]
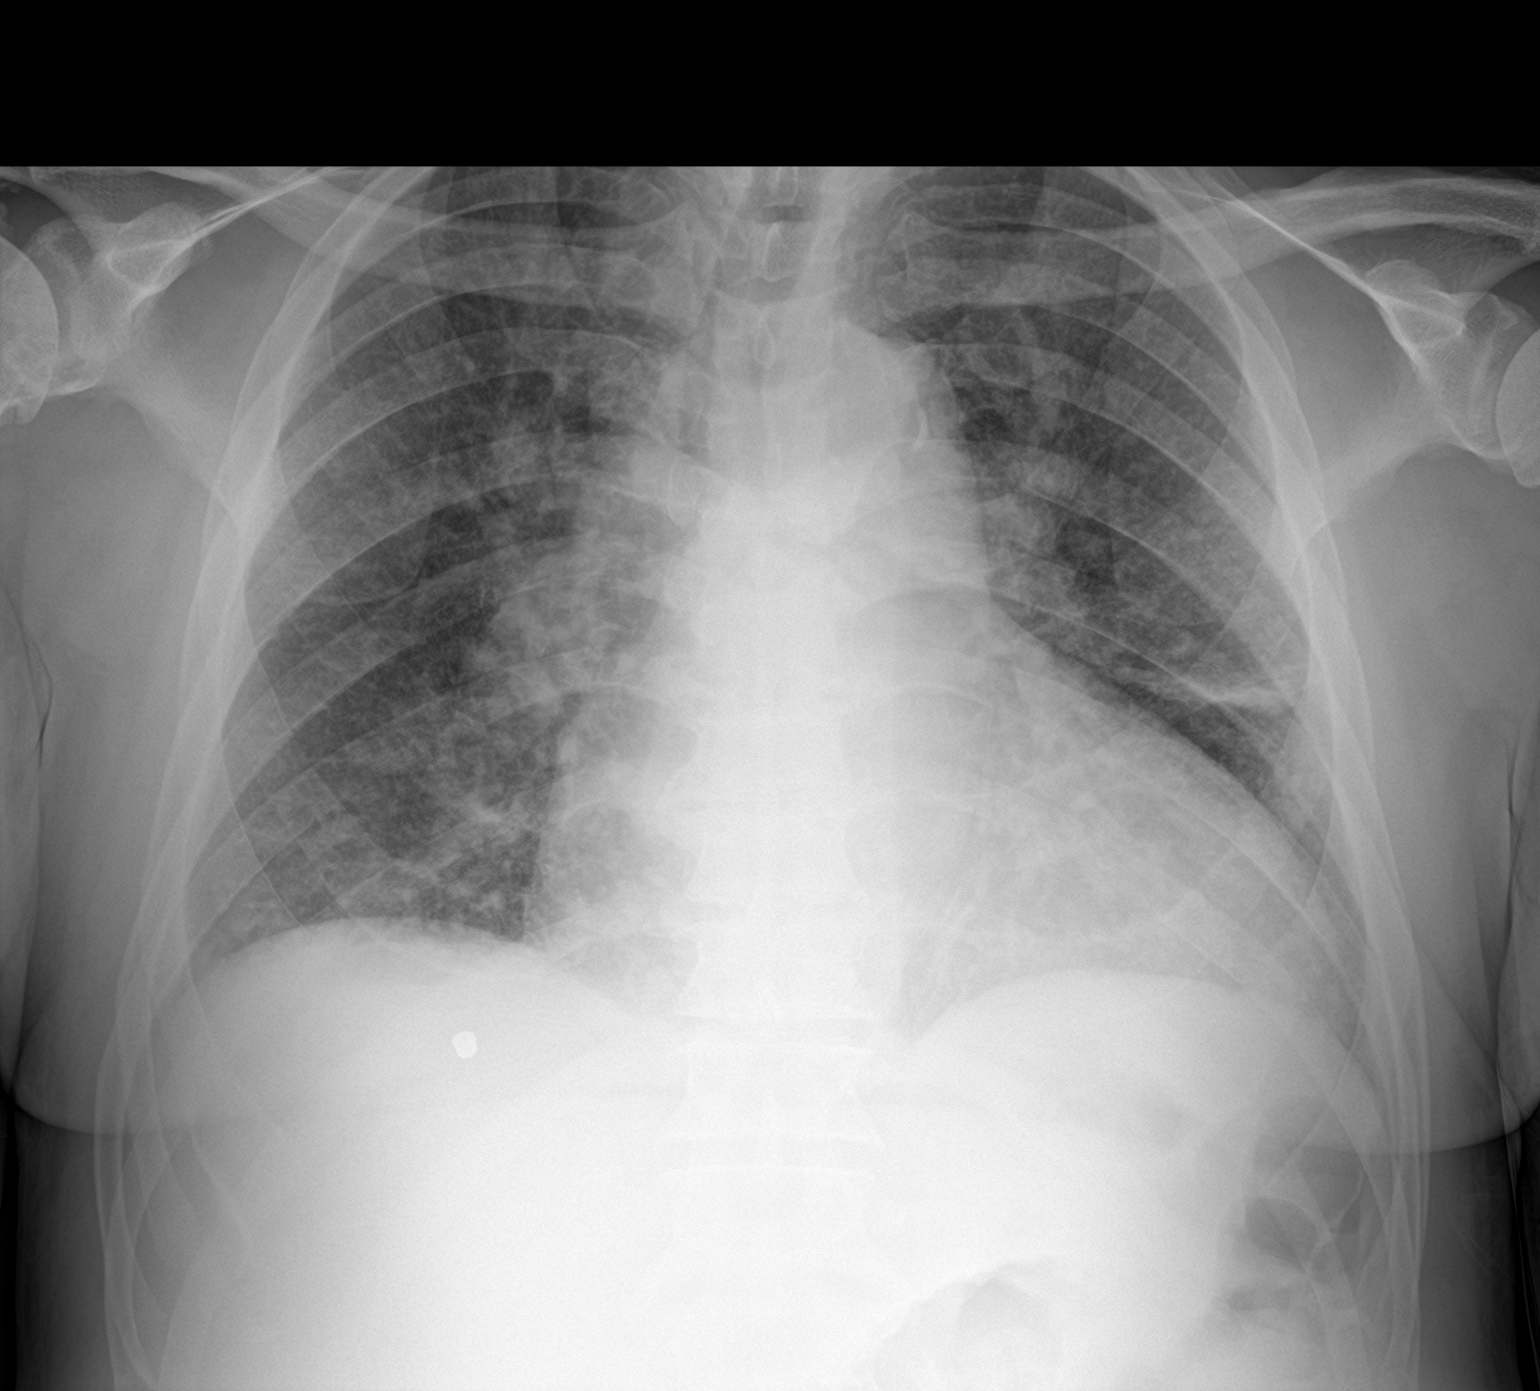

[2 of 2 positions shown; findings below may reference images not displayed]

FINDINGS: Cardiomegaly with vascular congestion and moderate pulmonary edema.
Subsegmental atelectasis in the left mid lung. Trace pleural
effusions. No pneumothorax. Aortic atherosclerosis.
IMPRESSION: Cardiomegaly with vascular congestion, moderate pulmonary edema
trace pleural effusions. Subsegmental atelectasis in the left mid
lung.

## 2019-02-10 MED ORDER — FUROSEMIDE 20 MG PO TABS
80.0000 mg | ORAL_TABLET | Freq: Once | ORAL | Status: AC
  Administered 2019-02-10: 80 mg via ORAL
  Filled 2019-02-10: qty 4

## 2019-02-10 NOTE — ED Notes (Signed)
PT removed from 2 liters of oxygen, PT now on room air per EDP.

## 2019-02-10 NOTE — ED Provider Notes (Signed)
[facility] EMERGENCY DEPARTMENT Provider Note   CSN: [id number] Arrival date & time: 02/09/19  2130    History   Chief Complaint Chief Complaint  Patient presents with  . Shortness of Breath    HPI [name] is a 58 y.o. male.     Patient presents to the emergency department for evaluation of shortness of breath and swelling.  Patient well-known to the ER for 107 previous visits in the last 6 months with similar complaints.  He was last seen 20 hours ago.     Past Medical History:  Diagnosis Date  . Chronic foot pain   . Cocaine abuse ([place])   . Diabetes mellitus without complication ([place])   . Hepatitis C    unsure   . Homelessness   . Hypertension   . Neuropathy   . Polysubstance abuse ([place])   . Schizophrenia ([place])   . Sleep apnea   . Systolic and diastolic CHF, chronic [facility])     Patient Active Problem List   Diagnosis Date Noted  . CHF (congestive heart failure), NYHA class III, acute on chronic, combined ([place]) 02/02/2019  . Scrotal edema 01/30/2019  . Combined systolic and diastolic heart failure ([place]) 01/19/2019  . Chest pain 01/16/2019  . 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  . Medically noncompliant   . Acute on chronic systolic CHF (congestive heart failure) ([place]) 01/02/2019  . Acute on chronic combined systolic (congestive) and diastolic (congestive) heart failure ([place]) 01/02/2019  . Acute on chronic systolic heart failure ([place]) 12/26/2018  . Mobitz type II atrioventricular block 12/25/2018  . Acute exacerbation of CHF (congestive heart failure) ([place]) 12/22/2018  . Acute on chronic respiratory failure with hypoxia ([place]) 12/22/2018  . CHF exacerbation ([place]) 12/19/2018  . Acute CHF (congestive heart failure) ([place]) 12/16/2018  . Diabetes mellitus without complication ([place]) 39/76/7341  . Acute on chronic systolic (congestive) heart failure ([place]) 12/11/2018  . Abdominal pain 12/11/2018  . Nausea vomiting and diarrhea 12/11/2018  . Microcytic  anemia 12/11/2018  . Evaluation by psychiatric service required   . MDD (major depressive disorder), severe ([place]) 11/25/2018  . Pressure injury of skin 11/08/2018  . Elevated troponin 10/18/2018  . HLD (hyperlipidemia) 10/18/2018  . Anxiety 10/18/2018  . CHF (congestive heart failure), NYHA class II, acute on chronic, combined ([place]) 10/18/2018  . Hypertensive urgency 10/12/2018  . Mild renal insufficiency 10/12/2018  . Cellulitis 10/12/2018  . Penile cellulitis   . Adjustment disorder with mixed disturbance of emotions and conduct   . Sleep apnea 10/03/2018  . Hypokalemia 09/17/2018  . Scrotal swelling 09/17/2018  . Urinary hesitancy   . Constipation   . Acute on chronic combined systolic and diastolic CHF (congestive heart failure) ([place]) 08/25/2018  . Acute respiratory failure with hypoxia ([place]) 08/25/2018  . Tobacco use 08/18/2018  . Homelessness 08/08/2018  . Smoker 08/08/2018  . Prostate enlargement 03/16/2018  . Aortic atherosclerosis ([place]) 03/16/2018  . 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 Cocaine abuse with cocaine-induced mood disorder ([place]) 09/18/2017  . Chronic foot pain   . Schizoaffective disorder, bipolar type ([place]) 09/30/2016  . Substance induced mood disorder ([place]) 03/13/2015  . Schizophrenia, paranoid type ([place]) 01/17/2015  . Suicidal ideation   . Drug hallucinosis ([place]) 10/08/2014  . Chronic paranoid schizophrenia ([place]) 09/07/2014  . Substance or medication-induced bipolar and related disorder with onset during intoxication ([place]) 08/10/2014  . Urinary retention   . Cocaine use disorder, severe, dependence ([place])   . Essential hypertension  03/28/2013  . 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 MULTIPLE TOOTH EXTRACTIONS          Home Medications    Prior to Admission medications   Medication Sig Start Date End Date Taking? Authorizing Provider   furosemide (LASIX) 40 MG tablet Take 1 tablet (40 mg total) by mouth 2 (two) times daily. 01/29/19 02/28/19 Yes Linwood DibblesKnapp, Jon, MD  glipiZIDE (GLUCOTROL) 5 MG tablet Take 0.5 tablets (2.5 mg total) by mouth 2 (two) times daily before a meal. 01/01/19  Yes Rai, Ripudeep K, MD  acetaminophen (TYLENOL) 500 MG tablet Take 1 tablet (500 mg total) by mouth every 6 (six) hours as needed. Patient not taking: Reported on 01/19/2019 01/08/19   Fayrene Helperran, [place], PA-C  ARIPiprazole (ABILIFY) 10 MG tablet Take 1 tablet (10 mg total) by mouth daily. Patient not taking: Reported on 01/13/2019 12/10/18   Joycelyn DasPokhrel, Laxman, MD  carbamazepine (TEGRETOL) 200 MG tablet Take 0.5 tablets (100 mg total) by mouth 2 (two) times daily. Patient not taking: Reported on 01/15/2019 11/26/18   Mancel BaleWentz, Elliott, MD  furosemide (LASIX) 40 MG tablet Take 1 tablet (40 mg total) by mouth 2 (two) times daily. Patient not taking: Reported on 02/05/2019 02/04/19   Tegeler, Canary Brimhristopher J, MD  Ipratropium-Albuterol (COMBIVENT) 20-100 MCG/ACT AERS respimat Inhale 1 puff into the lungs every 6 (six) hours. Patient not taking: Reported on 01/15/2019 11/30/18   Palumbo, April, MD  lisinopril (ZESTRIL) 10 MG tablet Take 1 tablet (10 mg total) by mouth daily. Patient not taking: Reported on 01/15/2019 11/26/18   Mancel BaleWentz, Elliott, MD  potassium chloride SA (K-DUR) 20 MEQ tablet Take 1 tablet (20 mEq total) by mouth 2 (two) times daily. Patient not taking: Reported on 01/15/2019 11/30/18   Nicanor AlconPalumbo, April, MD    Family History Family History  Problem Relation Age of Onset  . Hypertension Other   . Diabetes Other     Social History Social History   Tobacco Use  . Smoking status: Current Every Day Smoker    Packs/day: 1.00    Years: 20.00    Pack years: 20.00    Types: Cigarettes  . Smokeless tobacco: Current User  Substance Use Topics  . Alcohol use: Yes    Comment: Daily Drinker   . Drug use: Yes    Frequency: 7.0 times per week    Types: "Crack" cocaine,  Cocaine, Marijuana    Comment: Crack     Allergies   Haldol [haloperidol]   Review of Systems Review of Systems  Respiratory: Positive for shortness of breath.   Cardiovascular: Positive for leg swelling.  All other systems reviewed and are negative.    Physical Exam Updated Vital Signs BP (!) 171/102   Pulse (!) 112   Temp 98 F (36.7 C)   Resp 20   SpO2 94%   Physical Exam Vitals signs and nursing note reviewed.  Constitutional:      General: He is not in acute distress.    Appearance: Normal appearance. He is well-developed.  HENT:     Head: Normocephalic and atraumatic.     Right Ear: Hearing normal.     Left Ear: Hearing normal.     Nose: Nose normal.  Eyes:     Conjunctiva/sclera: Conjunctivae normal.     Pupils: Pupils are equal, round, and reactive to light.  Neck:     Musculoskeletal: Normal range of motion and neck  supple.  Cardiovascular:     Rate and Rhythm: Regular rhythm.     Heart sounds: S1 normal and S2 normal. No murmur. No friction rub. No gallop.   Pulmonary:     Effort: Pulmonary effort is normal. No respiratory distress.     Breath sounds: Rales present.  Chest:     Chest wall: No tenderness.  Abdominal:     General: Bowel sounds are normal.     Palpations: Abdomen is soft.     Tenderness: There is no abdominal tenderness. There is no guarding or rebound. Negative signs include Murphy's sign and McBurney's sign.     Hernia: No hernia is present.  Musculoskeletal: Normal range of motion.     Right lower leg: Edema present.     Left lower leg: Edema present.  Skin:    General: Skin is warm and dry.     Findings: No rash.  Neurological:     Mental Status: He is alert and oriented to person, place, and time.     GCS: GCS eye subscore is 4. GCS verbal subscore is 5. GCS motor subscore is 6.     Cranial Nerves: No cranial nerve deficit.     Sensory: No sensory deficit.     Coordination: Coordination normal.  Psychiatric:         Speech: Speech normal.        Behavior: Behavior normal.        Thought Content: Thought content normal.      ED Treatments / Results  Labs (all labs ordered are listed, but only abnormal results are displayed) Labs Reviewed - No data to display  EKG None  [facility]  Result Date: 02/10/2019 CLINICAL DATA:  Shortness of breath EXAM: PORTABLE CHEST 1 VIEW COMPARISON:  02/05/2019 FINDINGS: Cardiomegaly. No frank interstitial edema. Left lung base is obscured, possibly reflecting an trace left pleural effusion. Lingular scarring/atelectasis. No pneumothorax. IMPRESSION: Cardiomegaly. No frank interstitial edema. Possible trace left pleural effusion. Electronically Signed   By: Charline BillsSriyesh  Krishnan M.D.   On: 02/10/2019 01:10    Procedures Procedures (including critical care time)  Medications Ordered in ED Medications - No data to display   Initial Impression / Assessment and Plan / ED Course  I have reviewed the triage vital signs and the nursing notes.  Pertinent labs & imaging results that were available during my care of the patient were reviewed by me and considered in my medical decision making (see chart for details).       Patient presents to the ER with similar complaints to previous visits.  He complains of shortness of breath and swelling.  He has chronic diffuse swelling of his lower extremities and scrotum.  The swelling is multifactorial.  He does have a history of cocaine abuse and also is noncompliant with treatment plans.  Patient appears to be at his baseline.  He is not in any distress, oxygen saturations around 95% on room air.  Chest x-ray performed, does not show any evidence of volume overload.  Patient in no distress currently, does not require hospitalization.   Final Clinical Impressions(s) / ED Diagnoses   Final diagnoses:  SOB (shortness of breath)  Homelessness    ED Discharge Orders    None       Dayanara Sherrill, Canary Brimhristopher J, MD  02/10/19 [phone]

## 2019-02-10 NOTE — ED Notes (Signed)
Pt understood dc material. NAD Noted 

## 2019-02-11 ENCOUNTER — Encounter (HOSPITAL_COMMUNITY): Payer: Self-pay | Admitting: Emergency Medicine

## 2019-02-11 ENCOUNTER — Emergency Department (HOSPITAL_COMMUNITY)
Admission: EM | Admit: 2019-02-11 | Discharge: 2019-02-12 | Disposition: A | Discharge status: Home / Self Care | Payer: Medicaid Other | Source: Home / Self Care | Attending: Emergency Medicine | Admitting: Emergency Medicine

## 2019-02-11 ENCOUNTER — Other Ambulatory Visit: Payer: Self-pay

## 2019-02-11 ENCOUNTER — Emergency Department (HOSPITAL_COMMUNITY)
Admission: EM | Admit: 2019-02-11 | Discharge: 2019-02-11 | Disposition: A | Discharge status: Home / Self Care | Payer: Medicaid Other | Attending: Emergency Medicine | Admitting: Emergency Medicine

## 2019-02-11 DIAGNOSIS — I10 Essential (primary) hypertension: Secondary | ICD-10-CM | POA: Insufficient documentation

## 2019-02-11 DIAGNOSIS — I5043 Acute on chronic combined systolic (congestive) and diastolic (congestive) heart failure: Secondary | ICD-10-CM | POA: Diagnosis not present

## 2019-02-11 DIAGNOSIS — F17210 Nicotine dependence, cigarettes, uncomplicated: Secondary | ICD-10-CM | POA: Insufficient documentation

## 2019-02-11 DIAGNOSIS — E119 Type 2 diabetes mellitus without complications: Secondary | ICD-10-CM | POA: Insufficient documentation

## 2019-02-11 DIAGNOSIS — Z91199 Patient's noncompliance with other medical treatment and regimen due to unspecified reason: Secondary | ICD-10-CM

## 2019-02-11 DIAGNOSIS — G8929 Other chronic pain: Secondary | ICD-10-CM | POA: Insufficient documentation

## 2019-02-11 DIAGNOSIS — Z79899 Other long term (current) drug therapy: Secondary | ICD-10-CM | POA: Insufficient documentation

## 2019-02-11 DIAGNOSIS — F1910 Other psychoactive substance abuse, uncomplicated: Secondary | ICD-10-CM | POA: Insufficient documentation

## 2019-02-11 DIAGNOSIS — I110 Hypertensive heart disease with heart failure: Secondary | ICD-10-CM | POA: Insufficient documentation

## 2019-02-11 DIAGNOSIS — M79606 Pain in leg, unspecified: Secondary | ICD-10-CM

## 2019-02-11 DIAGNOSIS — Z9119 Patient's noncompliance with other medical treatment and regimen: Secondary | ICD-10-CM

## 2019-02-11 DIAGNOSIS — Z590 Homelessness: Secondary | ICD-10-CM | POA: Insufficient documentation

## 2019-02-11 DIAGNOSIS — M79604 Pain in right leg: Secondary | ICD-10-CM | POA: Insufficient documentation

## 2019-02-11 DIAGNOSIS — N5089 Other specified disorders of the male genital organs: Secondary | ICD-10-CM | POA: Diagnosis present

## 2019-02-11 DIAGNOSIS — M79605 Pain in left leg: Secondary | ICD-10-CM | POA: Insufficient documentation

## 2019-02-11 MED ORDER — ACETAMINOPHEN 325 MG PO TABS
650.0000 mg | ORAL_TABLET | Freq: Once | ORAL | Status: AC
  Administered 2019-02-11: 650 mg via ORAL
  Filled 2019-02-11: qty 2

## 2019-02-11 MED ORDER — FUROSEMIDE 10 MG/ML IJ SOLN
80.0000 mg | Freq: Once | INTRAMUSCULAR | Status: AC
  Administered 2019-02-11: 07:00:00 80 mg via INTRAMUSCULAR
  Filled 2019-02-11: qty 8

## 2019-02-11 NOTE — ED Triage Notes (Signed)
Pt BIB [facility], c/o scrotal swelling and requesting to have his prostate checked.

## 2019-02-11 NOTE — ED Notes (Signed)
Upon taking patients vitals pt oxygen was between 88-93%. Placed pt on 2L. Oxygen up to 95%

## 2019-02-11 NOTE — ED Provider Notes (Signed)
[facility] Provider Note   CSN: [id number] Arrival date & time: 02/11/19  0146     History   Chief Complaint Chief Complaint  Patient presents with  . Groin Swelling    HPI [name] is a 58 y.o. male.     HPI  58 year old with history of cocaine use, CHF, homelessness comes in a chief complaint of groin swelling. Patient is sleeping comfortably during my assessment.  When I woke him up he reported that his swelling in the groin is bothering him and is uncomfortable.  He is also complaining of leg pain.  He states that he has been taking his medicine as prescribed.    Past Medical History:  Diagnosis Date  . Chronic foot pain   . Cocaine abuse (HCC)   . Diabetes mellitus without complication (HCC)   . Hepatitis C    unsure   . Homelessness   . Hypertension   . Neuropathy   . Polysubstance abuse (HCC)   . Schizophrenia (HCC)   . Sleep apnea   . Systolic and diastolic CHF, chronic [facility](HCC)     Patient Active Problem List   Diagnosis Date Noted  . CHF (congestive heart failure), NYHA class III, acute on chronic, combined (HCC) 02/02/2019  . Scrotal edema 01/30/2019  . Combined systolic and diastolic heart failure (HCC) 01/19/2019  . Chest pain 01/16/2019  . 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  . Medically noncompliant   . Acute on chronic systolic CHF (congestive heart failure) (HCC) 01/02/2019  . 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 Acute on chronic systolic heart failure (HCC) 12/26/2018  . Mobitz type II atrioventricular block 12/25/2018  . Acute exacerbation of CHF (congestive heart failure) (HCC) 12/22/2018  . Acute on chronic respiratory failure with hypoxia (HCC) 12/22/2018  . CHF exacerbation (HCC) 12/19/2018  . Acute CHF (congestive heart failure) (HCC) 12/16/2018  . Diabetes mellitus without complication (HCC) 12/11/2018  . Acute on chronic systolic  (congestive) heart failure (HCC) 12/11/2018  . Abdominal pain 12/11/2018  . Nausea vomiting and diarrhea 12/11/2018  . Microcytic anemia 12/11/2018  . Evaluation by psychiatric service required   . MDD (major depressive disorder), severe (HCC) 11/25/2018  . Pressure injury of skin 11/08/2018  . Elevated troponin 10/18/2018  . HLD (hyperlipidemia) 10/18/2018  . Anxiety 10/18/2018  . CHF (congestive heart failure), NYHA class II, acute on chronic, combined (HCC) 10/18/2018  . Hypertensive urgency 10/12/2018  . Mild renal insufficiency 10/12/2018  . Cellulitis 10/12/2018  . Penile cellulitis   . Adjustment disorder with mixed disturbance of emotions and conduct   . Sleep apnea 10/03/2018  . Hypokalemia 09/17/2018  . Scrotal swelling 09/17/2018  . Urinary hesitancy   . Constipation   . Acute on chronic combined systolic and diastolic CHF (congestive heart failure) (HCC) 08/25/2018  . Acute respiratory failure with hypoxia (HCC) 08/25/2018  . Tobacco use 08/18/2018  . Homelessness 08/08/2018  . Smoker 08/08/2018  . Prostate enlargement 03/16/2018  . Aortic atherosclerosis (HCC) 03/16/2018  . 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 Cocaine abuse with cocaine-induced mood disorder (HCC) 09/18/2017  . Chronic foot pain   . Schizoaffective disorder, bipolar type (HCC) 09/30/2016  . Substance induced mood disorder (HCC) 03/13/2015  . Schizophrenia, paranoid type (HCC) 01/17/2015  . Suicidal ideation   . Drug hallucinosis (HCC) 10/08/2014  . Chronic paranoid schizophrenia (HCC) 09/07/2014  . Substance or medication-induced bipolar and related disorder  with onset during intoxication (HCC) 08/10/2014  . Urinary retention   . Cocaine use disorder, severe, dependence (HCC)   . Essential hypertension 03/28/2013  . 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 MULTIPLE TOOTH EXTRACTIONS           Home Medications    Prior to Admission medications   Medication Sig Start Date End Date Taking? Authorizing Provider  acetaminophen (TYLENOL) 500 MG tablet Take 1 tablet (500 mg total) by mouth every 6 (six) hours as needed. Patient not taking: Reported on 01/19/2019 01/08/19   Fayrene Helperran, [place], PA-C  ARIPiprazole (ABILIFY) 10 MG tablet Take 1 tablet (10 mg total) by mouth daily. Patient not taking: Reported on 01/13/2019 12/10/18   Joycelyn DasPokhrel, Laxman, MD  carbamazepine (TEGRETOL) 200 MG tablet Take 0.5 tablets (100 mg total) by mouth 2 (two) times daily. Patient not taking: Reported on 01/15/2019 11/26/18   Mancel BaleWentz, Elliott, MD  furosemide (LASIX) 40 MG tablet Take 1 tablet (40 mg total) by mouth 2 (two) times daily. 01/29/19 02/28/19  Linwood DibblesKnapp, Jon, MD  furosemide (LASIX) 40 MG tablet Take 1 tablet (40 mg total) by mouth 2 (two) times daily. Patient not taking: Reported on 02/05/2019 02/04/19   Tegeler, Canary Brimhristopher J, MD  glipiZIDE (GLUCOTROL) 5 MG tablet Take 0.5 tablets (2.5 mg total) by mouth 2 (two) times daily before a meal. 01/01/19   Rai, Ripudeep K, MD  Ipratropium-Albuterol (COMBIVENT) 20-100 MCG/ACT AERS respimat Inhale 1 puff into the lungs every 6 (six) hours. Patient not taking: Reported on 01/15/2019 11/30/18   Palumbo, April, MD  lisinopril (ZESTRIL) 10 MG tablet Take 1 tablet (10 mg total) by mouth daily. Patient not taking: Reported on 01/15/2019 11/26/18   Mancel BaleWentz, Elliott, MD  potassium chloride SA (K-DUR) 20 MEQ tablet Take 1 tablet (20 mEq total) by mouth 2 (two) times daily. Patient not taking: Reported on 01/15/2019 11/30/18   Nicanor AlconPalumbo, April, MD    Family History Family History  Problem Relation Age of Onset  . Hypertension Other   . Diabetes Other     Social History Social History   Tobacco Use  . Smoking status: Current Every Day Smoker    Packs/day: 1.00    Years: 20.00    Pack years: 20.00    Types: Cigarettes  . Smokeless tobacco: Current User  Substance Use Topics  . Alcohol  use: Yes    Comment: Daily Drinker   . Drug use: Yes    Frequency: 7.0 times per week    Types: "Crack" cocaine, Cocaine, Marijuana    Comment: Crack     Allergies   Haldol [haloperidol]   Review of Systems Review of Systems  Constitutional: Positive for activity change.  Genitourinary: Positive for scrotal swelling.  Musculoskeletal: Positive for myalgias.  Skin: Positive for rash.     Physical Exam Updated Vital Signs BP (!) 154/96   Pulse (!) 102   Temp 97.9 F (36.6 C) (Oral)   Resp (!) 22   SpO2 100%   Physical Exam Vitals signs and nursing note reviewed.  Constitutional:      Appearance: He is well-developed.  HENT:     Head: Atraumatic.  Neck:     Musculoskeletal: Neck supple.  Cardiovascular:     Comments: Heart rate is 102. Pulmonary:     Effort: Pulmonary effort is normal. No respiratory distress.     Breath sounds: Rales present. No wheezing.  Comments: Bibasilar rales Genitourinary:    Comments: Patient has moderately swollen/edematous scrotum He also has 1+ pitting edema in the lower extremities bilaterally Skin:    General: Skin is warm.  Neurological:     Mental Status: He is alert and oriented to person, place, and time.      ED Treatments / Results  Labs (all labs ordered are listed, but only abnormal results are displayed) Labs Reviewed - No data to display  EKG None  Radiology Dg Chest [facility] 1 View  Result Date: 02/10/2019 CLINICAL DATA:  Shortness of breath EXAM: PORTABLE CHEST 1 VIEW COMPARISON:  02/05/2019 FINDINGS: Cardiomegaly. No frank interstitial edema. Left lung base is obscured, possibly reflecting an trace left pleural effusion. Lingular scarring/atelectasis. No pneumothorax. IMPRESSION: Cardiomegaly. No frank interstitial edema. Possible trace left pleural effusion. Electronically Signed   By: Julian Hy M.D.   On: 02/10/2019 01:10    Procedures Procedures (including critical care time)  Medications  Ordered in ED Medications  furosemide (LASIX) injection 80 mg (has no administration in time range)  acetaminophen (TYLENOL) tablet 650 mg (has no administration in time range)     Initial Impression / Assessment and Plan / ED Course  I have reviewed the triage vital signs and the nursing notes.  Pertinent labs & imaging results that were available during my care of the patient were reviewed by me and considered in my medical decision making (see chart for details).        Patient comes in a chief complaint of scrotum swelling and leg swelling.  He has polysubstance abuse history along with CHF and homelessness.  He is noted to have scrotal edema which is not new for him.  We will give him IM Lasix here.  He is slightly tachycardic, there is no respiratory distress, tachypnea, hypoxia.  X-ray from yesterday reviewed.  Final Clinical Impressions(s) / ED Diagnoses   Final diagnoses:  Scrotal swelling    ED Discharge Orders    None       Varney Biles, MD 02/11/19 [phone]

## 2019-02-11 NOTE — ED Notes (Signed)
Pt requested sox and given the same

## 2019-02-11 NOTE — ED Triage Notes (Signed)
Pt came in reports he was given a shot to his legs that tightened them up and he has had difficulty walking since. Pt reports scrotal swelling as well. Pt denies SI.

## 2019-02-11 NOTE — ED Notes (Signed)
Patient verbalizes understanding of discharge instructions. Opportunity for questioning and answers were provided. Armband removed by staff, pt discharged from ED, to get home via bus.

## 2019-02-12 ENCOUNTER — Other Ambulatory Visit: Payer: Self-pay

## 2019-02-12 ENCOUNTER — Emergency Department (HOSPITAL_COMMUNITY): Payer: Medicaid Other

## 2019-02-12 ENCOUNTER — Observation Stay (HOSPITAL_COMMUNITY)
Admission: EM | Admit: 2019-02-12 | Discharge: 2019-02-13 | Discharge status: Against Medical Advice | Payer: Medicaid Other | Attending: Internal Medicine | Admitting: Internal Medicine

## 2019-02-12 ENCOUNTER — Encounter (HOSPITAL_COMMUNITY): Payer: Self-pay | Admitting: Emergency Medicine

## 2019-02-12 DIAGNOSIS — I110 Hypertensive heart disease with heart failure: Secondary | ICD-10-CM | POA: Diagnosis not present

## 2019-02-12 DIAGNOSIS — J90 Pleural effusion, not elsewhere classified: Secondary | ICD-10-CM | POA: Diagnosis not present

## 2019-02-12 DIAGNOSIS — Z79899 Other long term (current) drug therapy: Secondary | ICD-10-CM | POA: Insufficient documentation

## 2019-02-12 DIAGNOSIS — F250 Schizoaffective disorder, bipolar type: Secondary | ICD-10-CM | POA: Insufficient documentation

## 2019-02-12 DIAGNOSIS — B1920 Unspecified viral hepatitis C without hepatic coma: Secondary | ICD-10-CM | POA: Insufficient documentation

## 2019-02-12 DIAGNOSIS — G8929 Other chronic pain: Secondary | ICD-10-CM | POA: Diagnosis not present

## 2019-02-12 DIAGNOSIS — F17210 Nicotine dependence, cigarettes, uncomplicated: Secondary | ICD-10-CM | POA: Diagnosis not present

## 2019-02-12 DIAGNOSIS — Z9119 Patient's noncompliance with other medical treatment and regimen: Secondary | ICD-10-CM | POA: Diagnosis not present

## 2019-02-12 DIAGNOSIS — Z5329 Procedure and treatment not carried out because of patient's decision for other reasons: Secondary | ICD-10-CM | POA: Diagnosis not present

## 2019-02-12 DIAGNOSIS — I5020 Unspecified systolic (congestive) heart failure: Secondary | ICD-10-CM

## 2019-02-12 DIAGNOSIS — Z20828 Contact with and (suspected) exposure to other viral communicable diseases: Secondary | ICD-10-CM | POA: Diagnosis not present

## 2019-02-12 DIAGNOSIS — I700 Atherosclerosis of aorta: Secondary | ICD-10-CM | POA: Diagnosis present

## 2019-02-12 DIAGNOSIS — E876 Hypokalemia: Secondary | ICD-10-CM | POA: Diagnosis not present

## 2019-02-12 DIAGNOSIS — E785 Hyperlipidemia, unspecified: Secondary | ICD-10-CM | POA: Insufficient documentation

## 2019-02-12 DIAGNOSIS — M79673 Pain in unspecified foot: Secondary | ICD-10-CM | POA: Diagnosis not present

## 2019-02-12 DIAGNOSIS — I5042 Chronic combined systolic (congestive) and diastolic (congestive) heart failure: Secondary | ICD-10-CM | POA: Diagnosis not present

## 2019-02-12 DIAGNOSIS — F4325 Adjustment disorder with mixed disturbance of emotions and conduct: Secondary | ICD-10-CM | POA: Insufficient documentation

## 2019-02-12 DIAGNOSIS — G4730 Sleep apnea, unspecified: Secondary | ICD-10-CM | POA: Insufficient documentation

## 2019-02-12 DIAGNOSIS — E1140 Type 2 diabetes mellitus with diabetic neuropathy, unspecified: Secondary | ICD-10-CM | POA: Diagnosis not present

## 2019-02-12 DIAGNOSIS — Z794 Long term (current) use of insulin: Secondary | ICD-10-CM | POA: Diagnosis not present

## 2019-02-12 DIAGNOSIS — F1414 Cocaine abuse with cocaine-induced mood disorder: Secondary | ICD-10-CM | POA: Insufficient documentation

## 2019-02-12 DIAGNOSIS — I509 Heart failure, unspecified: Secondary | ICD-10-CM

## 2019-02-12 DIAGNOSIS — Z590 Homelessness: Secondary | ICD-10-CM | POA: Insufficient documentation

## 2019-02-12 LAB — CBC WITH DIFFERENTIAL/PLATELET
Abs Immature Granulocytes: 0.03 10*3/uL (ref 0.00–0.07)
Basophils Absolute: 0 10*3/uL (ref 0.0–0.1)
Basophils Relative: 1 %
Eosinophils Absolute: 0 10*3/uL (ref 0.0–0.5)
Eosinophils Relative: 1 %
HCT: 30.2 % — ABNORMAL LOW (ref 39.0–52.0)
Hemoglobin: 8.4 g/dL — ABNORMAL LOW (ref 13.0–17.0)
Immature Granulocytes: 0 %
Lymphocytes Relative: 10 %
Lymphs Abs: 0.8 10*3/uL (ref 0.7–4.0)
MCH: 21.3 pg — ABNORMAL LOW (ref 26.0–34.0)
MCHC: 27.8 g/dL — ABNORMAL LOW (ref 30.0–36.0)
MCV: 76.6 fL — ABNORMAL LOW (ref 80.0–100.0)
Monocytes Absolute: 0.8 10*3/uL (ref 0.1–1.0)
Monocytes Relative: 10 %
Neutro Abs: 6.5 10*3/uL (ref 1.7–7.7)
Neutrophils Relative %: 78 %
Platelets: 334 10*3/uL (ref 150–400)
RBC: 3.94 MIL/uL — ABNORMAL LOW (ref 4.22–5.81)
RDW: 21.6 % — ABNORMAL HIGH (ref 11.5–15.5)
WBC: 8.2 10*3/uL (ref 4.0–10.5)
nRBC: 0 % (ref 0.0–0.2)

## 2019-02-12 LAB — COMPREHENSIVE METABOLIC PANEL
ALT: 12 U/L (ref 0–44)
AST: 21 U/L (ref 15–41)
Albumin: 2.7 g/dL — ABNORMAL LOW (ref 3.5–5.0)
Alkaline Phosphatase: 177 U/L — ABNORMAL HIGH (ref 38–126)
Anion gap: 9 (ref 5–15)
BUN: 13 mg/dL (ref 6–20)
CO2: 31 mmol/L (ref 22–32)
Calcium: 8.3 mg/dL — ABNORMAL LOW (ref 8.9–10.3)
Chloride: 98 mmol/L (ref 98–111)
Creatinine, Ser: 0.83 mg/dL (ref 0.61–1.24)
GFR calc Af Amer: >60 mL/min (ref >60)
GFR calc non Af Amer: >60 mL/min (ref >60)
Glucose, Bld: 318 mg/dL — ABNORMAL HIGH (ref 70–99)
Potassium: 2.8 mmol/L — ABNORMAL LOW (ref 3.5–5.1)
Sodium: 138 mmol/L (ref 135–145)
Total Bilirubin: 0.6 mg/dL (ref 0.3–1.2)
Total Protein: 7 g/dL (ref 6.5–8.1)

## 2019-02-12 LAB — BASIC METABOLIC PANEL
Anion gap: 9 (ref 5–15)
BUN: 13 mg/dL (ref 6–20)
CO2: 32 mmol/L (ref 22–32)
Calcium: 8.5 mg/dL — ABNORMAL LOW (ref 8.9–10.3)
Chloride: 100 mmol/L (ref 98–111)
Creatinine, Ser: 0.87 mg/dL (ref 0.61–1.24)
GFR calc Af Amer: >60 mL/min (ref >60)
GFR calc non Af Amer: >60 mL/min (ref >60)
Glucose, Bld: 254 mg/dL — ABNORMAL HIGH (ref 70–99)
Potassium: 3.2 mmol/L — ABNORMAL LOW (ref 3.5–5.1)
Sodium: 141 mmol/L (ref 135–145)

## 2019-02-12 LAB — SARS CORONAVIRUS 2 BY RT PCR (HOSPITAL ORDER, PERFORMED IN ~~LOC~~ HOSPITAL LAB): SARS Coronavirus 2: NEGATIVE

## 2019-02-12 LAB — MAGNESIUM: Magnesium: 1.7 mg/dL (ref 1.7–2.4)

## 2019-02-12 LAB — BRAIN NATRIURETIC PEPTIDE: B Natriuretic Peptide: 924.1 pg/mL — ABNORMAL HIGH (ref 0.0–100.0)

## 2019-02-12 LAB — CBG MONITORING, ED: Glucose-Capillary: 242 mg/dL — ABNORMAL HIGH (ref 70–99)

## 2019-02-12 MED ORDER — ACETAMINOPHEN 325 MG PO TABS
650.0000 mg | ORAL_TABLET | Freq: Once | ORAL | Status: AC
  Administered 2019-02-12: 16:00:00 650 mg via ORAL
  Filled 2019-02-12: qty 2

## 2019-02-12 MED ORDER — POTASSIUM CHLORIDE CRYS ER 20 MEQ PO TBCR
40.0000 meq | EXTENDED_RELEASE_TABLET | Freq: Once | ORAL | Status: AC
  Administered 2019-02-12: 40 meq via ORAL
  Filled 2019-02-12: qty 2

## 2019-02-12 MED ORDER — FUROSEMIDE 10 MG/ML IJ SOLN
40.0000 mg | Freq: Once | INTRAMUSCULAR | Status: AC
  Administered 2019-02-12: 40 mg via INTRAVENOUS
  Filled 2019-02-12: qty 4

## 2019-02-12 MED ORDER — POTASSIUM CHLORIDE CRYS ER 20 MEQ PO TBCR
20.0000 meq | EXTENDED_RELEASE_TABLET | Freq: Two times a day (BID) | ORAL | Status: DC
  Administered 2019-02-12 – 2019-02-13 (×2): 20 meq via ORAL
  Filled 2019-02-12 (×2): qty 1

## 2019-02-12 MED ORDER — INSULIN ASPART 100 UNIT/ML ~~LOC~~ SOLN
0.0000 [IU] | Freq: Three times a day (TID) | SUBCUTANEOUS | Status: DC
  Administered 2019-02-13: 3 [IU] via SUBCUTANEOUS
  Administered 2019-02-13: 7 [IU] via SUBCUTANEOUS
  Filled 2019-02-12: qty 0.09

## 2019-02-12 MED ORDER — ACETAMINOPHEN 500 MG PO TABS
500.0000 mg | ORAL_TABLET | Freq: Four times a day (QID) | ORAL | Status: DC | PRN
  Administered 2019-02-13 (×2): 500 mg via ORAL
  Filled 2019-02-12 (×2): qty 1

## 2019-02-12 MED ORDER — SODIUM CHLORIDE 0.9 % IV SOLN
3.0000 g | Freq: Four times a day (QID) | INTRAVENOUS | Status: DC
  Administered 2019-02-13 (×2): 3 g via INTRAVENOUS
  Filled 2019-02-12 (×4): qty 8

## 2019-02-12 MED ORDER — HYDRALAZINE HCL 20 MG/ML IJ SOLN: 5.0000 mg | INTRAMUSCULAR | Status: DC | PRN

## 2019-02-12 MED ORDER — ARIPIPRAZOLE 10 MG PO TABS
10.0000 mg | ORAL_TABLET | Freq: Every day | ORAL | Status: DC
  Administered 2019-02-12 – 2019-02-13 (×2): 10 mg via ORAL
  Filled 2019-02-12 (×2): qty 1

## 2019-02-12 MED ORDER — CARBAMAZEPINE 200 MG PO TABS
100.0000 mg | ORAL_TABLET | Freq: Two times a day (BID) | ORAL | Status: DC
  Administered 2019-02-13: 100 mg via ORAL
  Filled 2019-02-12 (×2): qty 0.5

## 2019-02-12 MED ORDER — FUROSEMIDE 10 MG/ML IJ SOLN: 40.0000 mg | Freq: Two times a day (BID) | INTRAMUSCULAR | Status: DC

## 2019-02-12 MED ORDER — FUROSEMIDE 20 MG PO TABS
40.0000 mg | ORAL_TABLET | ORAL | Status: AC
  Administered 2019-02-12: 40 mg via ORAL
  Filled 2019-02-12: qty 2

## 2019-02-12 MED ORDER — INSULIN ASPART 100 UNIT/ML ~~LOC~~ SOLN
0.0000 [IU] | Freq: Every day | SUBCUTANEOUS | Status: DC
  Administered 2019-02-12: 2 [IU] via SUBCUTANEOUS
  Filled 2019-02-12: qty 0.05

## 2019-02-12 MED ORDER — SODIUM CHLORIDE 0.9 % IV SOLN
3.0000 g | Freq: Once | INTRAVENOUS | Status: AC
  Administered 2019-02-12: 3 g via INTRAVENOUS
  Filled 2019-02-12: qty 8

## 2019-02-12 MED ORDER — FUROSEMIDE 10 MG/ML IJ SOLN
40.0000 mg | Freq: Two times a day (BID) | INTRAMUSCULAR | Status: DC
  Administered 2019-02-13: 40 mg via INTRAVENOUS
  Filled 2019-02-12: qty 4

## 2019-02-12 MED ORDER — ENOXAPARIN SODIUM 40 MG/0.4ML ~~LOC~~ SOLN
40.0000 mg | SUBCUTANEOUS | Status: DC
  Administered 2019-02-12: 40 mg via SUBCUTANEOUS
  Filled 2019-02-12: qty 0.4

## 2019-02-12 MED ORDER — ACETAMINOPHEN 500 MG PO TABS
1000.0000 mg | ORAL_TABLET | Freq: Once | ORAL | Status: AC
  Administered 2019-02-12: 1000 mg via ORAL
  Filled 2019-02-12: qty 2

## 2019-02-12 MED ORDER — POTASSIUM CHLORIDE 10 MEQ/100ML IV SOLN
10.0000 meq | Freq: Once | INTRAVENOUS | Status: AC
  Administered 2019-02-12: 10 meq via INTRAVENOUS
  Filled 2019-02-12: qty 100

## 2019-02-12 NOTE — ED Provider Notes (Signed)
[facility] DEPT Provider Note   CSN: [id number] Arrival date & time: 02/12/19  9379     History   Chief Complaint Chief Complaint  Patient presents with  . Testicle Pain    HPI [name] is a 58 y.o. male.     Patient complains of shortness of breath.  Patient has a history of significant congestive heart failure.  He has no chest pain  The history is provided by the patient. No language interpreter was used.  Shortness of Breath Severity:  Moderate Onset quality:  Gradual Timing:  Constant Progression:  Worsening Chronicity:  Recurrent Context: activity   Relieved by:  Nothing Worsened by:  Nothing Ineffective treatments:  None tried Associated symptoms: no abdominal pain, no chest pain, no cough, no headaches and no rash     Past Medical History:  Diagnosis Date  . Chronic foot pain   . Cocaine abuse ([place])   . Diabetes mellitus without complication ([place])   . Hepatitis C    unsure   . Homelessness   . Hypertension   . Neuropathy   . Polysubstance abuse ([place])   . Schizophrenia ([place])   . Sleep apnea   . Systolic and diastolic CHF, chronic [facility])     Patient Active Problem List   Diagnosis Date Noted  . CHF (congestive heart failure), NYHA class III, acute on chronic, combined ([place]) 02/02/2019  . Scrotal edema 01/30/2019  . Combined systolic and diastolic heart failure ([place]) 01/19/2019  . Chest pain 01/16/2019  . 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  . Medically noncompliant   . Acute on chronic systolic CHF (congestive heart failure) ([place]) 01/02/2019  . Acute on chronic combined systolic (congestive) and diastolic (congestive) heart failure ([place]) 01/02/2019  . Acute on chronic systolic heart failure ([place]) 12/26/2018  . Mobitz type II atrioventricular block 12/25/2018  . Acute exacerbation of CHF (congestive heart failure) ([place]) 12/22/2018  . Acute on chronic respiratory failure with hypoxia ([place]) 12/22/2018  . CHF exacerbation  ([place]) 12/19/2018  . Acute CHF (congestive heart failure) ([place]) 12/16/2018  . Diabetes mellitus without complication ([place]) 02/40/9735  . Acute on chronic systolic (congestive) heart failure ([place]) 12/11/2018  . Abdominal pain 12/11/2018  . Nausea vomiting and diarrhea 12/11/2018  . Microcytic anemia 12/11/2018  . Evaluation by psychiatric service required   . MDD (major depressive disorder), severe ([place]) 11/25/2018  . Pressure injury of skin 11/08/2018  . Elevated troponin 10/18/2018  . HLD (hyperlipidemia) 10/18/2018  . Anxiety 10/18/2018  . CHF (congestive heart failure), NYHA class II, acute on chronic, combined ([place]) 10/18/2018  . Hypertensive urgency 10/12/2018  . Mild renal insufficiency 10/12/2018  . Cellulitis 10/12/2018  . Penile cellulitis   . Adjustment disorder with mixed disturbance of emotions and conduct   . Sleep apnea 10/03/2018  . Hypokalemia 09/17/2018  . Scrotal swelling 09/17/2018  . Urinary hesitancy   . Constipation   . Acute on chronic combined systolic and diastolic CHF (congestive heart failure) ([place]) 08/25/2018  . Acute respiratory failure with hypoxia ([place]) 08/25/2018  . Tobacco use 08/18/2018  . Homelessness 08/08/2018  . Smoker 08/08/2018  . Prostate enlargement 03/16/2018  . Aortic atherosclerosis ([place]) 03/16/2018  . 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 Cocaine abuse with cocaine-induced mood disorder ([place]) 09/18/2017  . Chronic foot pain   . Schizoaffective disorder, bipolar type ([place]) 09/30/2016  . Substance induced mood disorder ([place]) 03/13/2015  . Schizophrenia, paranoid type ([place]) 01/17/2015  . Suicidal ideation   .  Drug hallucinosis (HCC) 10/08/2014  . Chronic paranoid schizophrenia (HCC) 09/07/2014  . 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 Urinary retention   . Cocaine use disorder, severe, dependence (HCC)   . Essential hypertension 03/28/2013  .  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 MULTIPLE TOOTH EXTRACTIONS          Home Medications    Prior to Admission medications   Medication Sig Start Date End Date Taking? Authorizing Provider  acetaminophen (TYLENOL) 500 MG tablet Take 1 tablet (500 mg total) by mouth every 6 (six) hours as needed. Patient not taking: Reported on 01/19/2019 01/08/19   Fayrene Helperran, [place], PA-C  ARIPiprazole (ABILIFY) 10 MG tablet Take 1 tablet (10 mg total) by mouth daily. Patient not taking: Reported on 01/13/2019 12/10/18   Joycelyn DasPokhrel, Laxman, MD  carbamazepine (TEGRETOL) 200 MG tablet Take 0.5 tablets (100 mg total) by mouth 2 (two) times daily. Patient not taking: Reported on 01/15/2019 11/26/18   Mancel BaleWentz, Elliott, MD  furosemide (LASIX) 40 MG tablet Take 1 tablet (40 mg total) by mouth 2 (two) times daily. 01/29/19 02/28/19  [place]wood DibblesKnapp, Jon, MD  furosemide (LASIX) 40 MG tablet Take 1 tablet (40 mg total) by mouth 2 (two) times daily. Patient not taking: Reported on 02/05/2019 02/04/19   Tegeler, Canary Brimhristopher J, MD  glipiZIDE (GLUCOTROL) 5 MG tablet Take 0.5 tablets (2.5 mg total) by mouth 2 (two) times daily before a meal. 01/01/19   Rai, Ripudeep K, MD  Ipratropium-Albuterol (COMBIVENT) 20-100 MCG/ACT AERS respimat Inhale 1 puff into the lungs every 6 (six) hours. Patient not taking: Reported on 01/15/2019 11/30/18   Palumbo, April, MD  lisinopril (ZESTRIL) 10 MG tablet Take 1 tablet (10 mg total) by mouth daily. Patient not taking: Reported on 01/15/2019 11/26/18   Mancel BaleWentz, Elliott, MD  potassium chloride SA (K-DUR) 20 MEQ tablet Take 1 tablet (20 mEq total) by mouth 2 (two) times daily. Patient not taking: Reported on 01/15/2019 11/30/18   Nicanor AlconPalumbo, April, MD    Family History Family History  Problem Relation Age of Onset  . Hypertension Other   . Diabetes Other     Social History Social History   Tobacco Use  . Smoking status: Current  Every Day Smoker    Packs/day: 1.00    Years: 20.00    Pack years: 20.00    Types: Cigarettes  . Smokeless tobacco: Current User  Substance Use Topics  . Alcohol use: Yes    Comment: Daily Drinker   . Drug use: Yes    Frequency: 7.0 times per week    Types: "Crack" cocaine, Cocaine, Marijuana    Comment: Crack     Allergies   Haldol [haloperidol]   Review of Systems Review of Systems  Constitutional: Negative for appetite change and fatigue.  HENT: Negative for congestion, ear discharge and sinus pressure.   Eyes: Negative for discharge.  Respiratory: Positive for shortness of breath. Negative for cough.   Cardiovascular: Negative for chest pain.  Gastrointestinal: Negative for abdominal pain and diarrhea.  Genitourinary: Negative for frequency and hematuria.  Musculoskeletal: Negative for back pain.  Skin: Negative for rash.  Neurological: Negative for seizures and headaches.  Psychiatric/Behavioral: Negative for hallucinations.     Physical Exam Updated Vital Signs BP (!) 168/104 (BP Location: Right Arm)   Pulse (!) 104   Temp 98.3 F (36.8 C) (Oral)  Resp 20   SpO2 99%   Physical Exam Vitals signs and nursing note reviewed.  Constitutional:      Appearance: He is well-developed.  HENT:     Head: Normocephalic.     Nose: Nose normal.  Eyes:     General: No scleral icterus.    Conjunctiva/sclera: Conjunctivae normal.  Neck:     Musculoskeletal: Neck supple.     Thyroid: No thyromegaly.  Cardiovascular:     Rate and Rhythm: Normal rate and regular rhythm.     Heart sounds: No murmur. No friction rub. No gallop.   Pulmonary:     Breath sounds: No stridor. No wheezing or rales.  Chest:     Chest wall: No tenderness.  Abdominal:     General: There is no distension.     Tenderness: There is no abdominal tenderness. There is no rebound.  Musculoskeletal: Normal range of motion.     Comments: Patient has anasarca.  He has edema all the way up his legs  and to his umbilicus  Lymphadenopathy:     Cervical: No cervical adenopathy.  Skin:    Findings: No erythema or rash.  Neurological:     Mental Status: He is oriented to person, place, and time.     Motor: No abnormal muscle tone.     Coordination: Coordination normal.  Psychiatric:        Behavior: Behavior normal.      ED Treatments / Results  Labs (all labs ordered are listed, but only abnormal results are displayed) Labs Reviewed  BRAIN NATRIURETIC PEPTIDE - Abnormal; Notable for the following components:      Result Value   B Natriuretic Peptide 924.1 (*)    All other components within normal limits  CBC WITH DIFFERENTIAL/PLATELET - Abnormal; Notable for the following components:   RBC 3.94 (*)    Hemoglobin 8.4 (*)    HCT 30.2 (*)    MCV 76.6 (*)    MCH 21.3 (*)    MCHC 27.8 (*)    RDW 21.6 (*)    All other components within normal limits  COMPREHENSIVE METABOLIC PANEL - Abnormal; Notable for the following components:   Potassium 2.8 (*)    Glucose, Bld 318 (*)    Calcium 8.3 (*)    Albumin 2.7 (*)    Alkaline Phosphatase 177 (*)    All other components within normal limits  SARS CORONAVIRUS 2 (HOSPITAL ORDER, [facility]FOR[facility])    EKG None  Radiology Dg Chest [place] 1 View  Result Date: 02/12/2019 CLINICAL DATA:  Shortness of breath for 6 months. EXAM: PORTABLE CHEST 1 VIEW COMPARISON:  February 10, 2019 FINDINGS: Cardiomegaly. The left retrocardiac region is not well assessed. Scar atelectasis in the lingula. The hila, mediastinum, lungs, and pleura are otherwise normal. IMPRESSION: No acute abnormalities. Electronically Signed   By: Gerome Samavid  Williams III M.D   On: 02/12/2019 16:14    Procedures Procedures (including critical care time)  Medications Ordered in ED Medications  potassium chloride SA (K-DUR) CR tablet 40 mEq (has no administration in time range)  potassium chloride 10 mEq in 100 mL IVPB (has no administration in time range)   acetaminophen (TYLENOL) tablet 650 mg (650 mg Oral Given 02/12/19 1534)  furosemide (LASIX) injection 40 mg (40 mg Intravenous Given 02/12/19 1548)     Initial Impression / Assessment and Plan / ED Course  I have reviewed the triage vital signs and the nursing notes.  Pertinent labs & imaging results that were available during my care of the patient were reviewed by me and considered in my medical decision making (see chart for details).        CRITICAL CARE Performed by: Bethann BerkshireJoseph Alean Kromer Total critical care time: 40 minutes Critical care time was exclusive of separately billable procedures and treating other patients. Critical care was necessary to treat or prevent imminent or life-threatening deterioration. Critical care was time spent personally by me on the following activities: development of treatment plan with patient and/or surrogate as well as nursing, discussions with consultants, evaluation of patient's response to treatment, examination of patient, obtaining history from patient or surrogate, ordering and performing treatments and interventions, ordering and review of laboratory studies, ordering and review of radiographic studies, pulse oximetry and re-evaluation of patient's condition.  Patient with hypokalemia he also has hypoxia his O2 sats dropped to 91% on 2 L.  And he has anasarca.  He will be admitted for diuresis Final Clinical Impressions(s) / ED Diagnoses   Final diagnoses:  Systolic congestive heart failure, unspecified HF chronicity [facility](HCC)    ED Discharge Orders    None       Bethann BerkshireZammit, Ayiden Milliman, MD 02/12/19 1751

## 2019-02-12 NOTE — ED Notes (Signed)
RN requested pt be ambulated while monitoring his oxygen levels. Pt demanded his legs and feet be cleaned because he "can't reach them and he's diabetic and needs to keep feet clean." Pts feet were cleaned and dried and non-slip socks were placed on the pt. Pt was ambulated while on oxygen via [place] at 2lpm and he requested to go to the RR while he walked. Pt was able to walk to the RR and back without assistance. Pt's oxygen level maintained at or above 91% while ambulated and using RR. Pt was then positioned back in the bed and reconnected to the monitor before giving him his dinner tray. Pt also provided with 3 graham crackers at his request because they "didn't give him any dessert."

## 2019-02-12 NOTE — H&P (Signed)
History and Physical    [name] GXQ:[id number] DOB: 1961/03/17 DOA: 02/12/2019  PCP: Patient, No Pcp Per Patient coming from: Home.   I have personally briefly reviewed patient's old medical records in [place]  Chief Complaint: anasarca, scrotal edema and sob since 2 days.   HPI: [name] is a 58 y.o. male with medical history significant of polysubstance, chronic systolic heart failure with LVEF of 25%  , non compliant to medications,  DM, anasarca, recently discharged from the hospital on 8/4 presents today with worsening scrotal edema, pedal edema and sob. Today he was hypoxic on arrival, afebrile, tachycardic, tachypnea, hypertensive and requiring upto 2 lit of [place] oxygen. He denies any chest pain, nausea, vomiting, abdominal pain, no hematemesis. No fever or chills. No dizziness or syncope.   ED Course: his lab work showed potassium of 2. 8, hemoglobin of 8.4 , albumin of 2.7 and alk phos of 177, BNP of 924, glucose of 318, COVID 19 is pending. CXR showed cardiomegaly and no other acute abnormalities found.  EKG shows non specific t wave abnormalities with sinus rhythm.   Review of Systems:  "All others reviewed and are negative,"   Past Medical History:  Diagnosis Date  . Chronic foot pain   . Cocaine abuse ([place])   . Diabetes mellitus without complication ([place])   . Hepatitis C    unsure   . Homelessness   . Hypertension   . Neuropathy   . Polysubstance abuse ([place])   . Schizophrenia ([place])   . Sleep apnea   . Systolic and diastolic CHF, chronic ([place])     Past Surgical History:  Procedure Laterality Date  . MULTIPLE TOOTH EXTRACTIONS      social history:  Pt reports he lives alone.    reports that he has been smoking cigarettes. He has a 20.00 pack-year smoking history. He uses smokeless tobacco. He reports current alcohol use. He reports current drug use. Frequency: 7.00 times per week. Drugs: "Crack" cocaine, Cocaine, and Marijuana.   Allergies  Allergen Reactions  . Haldol [Haloperidol] Other (See Comments)    Muscle spasms, loss of voluntary movement. However, pt has taken Thorazine on multiple occasions with no adverse effects.     Family History  Problem Relation Age of Onset  . Hypertension Other   . Diabetes Other    Family history reviewed and not pertinent.  Prior to Admission medications   Medication Sig Start Date End Date Taking? Authorizing Provider  acetaminophen (TYLENOL) 500 MG tablet Take 1 tablet (500 mg total) by mouth every 6 (six) hours as needed. Patient not taking: Reported on 01/19/2019 01/08/19   Domenic Moras, PA-C  ARIPiprazole (ABILIFY) 10 MG tablet Take 1 tablet (10 mg total) by mouth daily. Patient not taking: Reported on 01/13/2019 12/10/18   Flora Lipps, MD  carbamazepine (TEGRETOL) 200 MG tablet Take 0.5 tablets (100 mg total) by mouth 2 (two) times daily. Patient not taking: Reported on 01/15/2019 11/26/18   Daleen Bo, MD  furosemide (LASIX) 40 MG tablet Take 1 tablet (40 mg total) by mouth 2 (two) times daily. 01/29/19 02/28/19  Dorie Rank, MD  furosemide (LASIX) 40 MG tablet Take 1 tablet (40 mg total) by mouth 2 (two) times daily. Patient not taking: Reported on 02/05/2019 02/04/19   Tegeler, Gwenyth Allegra, MD  glipiZIDE (GLUCOTROL) 5 MG tablet Take 0.5 tablets (2.5 mg total) by mouth 2 (two) times daily before a meal. 01/01/19   Rai, Vernelle Emerald, MD  Ipratropium-Albuterol (COMBIVENT) 20-100 MCG/ACT AERS respimat Inhale 1 puff into the lungs every 6 (six) hours. Patient not taking: Reported on 01/15/2019 11/30/18   Palumbo, April, MD  lisinopril (ZESTRIL) 10 MG tablet Take 1 tablet (10 mg total) by mouth daily. Patient not taking: Reported on 01/15/2019 11/26/18   Daleen Bo, MD  potassium chloride SA (K-DUR) 20 MEQ tablet Take 1 tablet (20 mEq total) by mouth 2 (two) times daily. Patient not taking: Reported on 01/15/2019 11/30/18   Palumbo, April, MD    Physical Exam:  Constitutional:  he was initially snoring and on 2 lit of [place] oxygen. Not in distress.  Vitals:   02/12/19 1530 02/12/19 1600 02/12/19 1749 02/12/19 1830  BP: (!) 155/102 (!) 170/106 (!) 168/104 (!) 170/107  Pulse: (!) 108 (!) 103 (!) 104 (!) 108  Resp:   20 (!) 23  Temp:      TempSrc:      SpO2: (!) 87% 99% 99% 96%   Eyes: PERRL, lids and conjunctivae normal ENMT: Mucous membranes are moist Neck:  no thyromegaly Respiratory: diminished at bases.  Cardiovascular: Regular rate and rhythm, S1S2,  Abdomen: no tenderness, no masses palpated. No hepatosplenomegaly. Bowel sounds positive.  Musculoskeletal: bilateral leg edema present.  Skin:  Swelling of the scrotal skin.  Neurologic:  Sleepy, but would wake up and answer questions appropriately.  Psychiatric: pt appears calm and not agitated . Mood appropriate.   Labs on Admission: I have personally reviewed following labs and imaging studies  CBC: Recent Labs  Lab 02/12/19 1549  WBC 8.2  NEUTROABS 6.5  HGB 8.4*  HCT 30.2*  MCV 76.6*  PLT 195   Basic Metabolic Panel: Recent Labs  Lab 02/12/19 1549  NA 138  K 2.8*  CL 98  CO2 31  GLUCOSE 318*  BUN 13  CREATININE 0.83  CALCIUM 8.3*   GFR: Estimated Creatinine Clearance: 118.5 mL/min (by C-G formula based on SCr of 0.83 mg/dL). Liver Function Tests: Recent Labs  Lab 02/12/19 1549  AST 21  ALT 12  ALKPHOS 177*  BILITOT 0.6  PROT 7.0  ALBUMIN 2.7*   No results for input(s): LIPASE, AMYLASE in the last 168 hours. No results for input(s): AMMONIA in the last 168 hours. Coagulation Profile: No results for input(s): INR, PROTIME in the last 168 hours. Cardiac Enzymes: No results for input(s): CKTOTAL, CKMB, CKMBINDEX, TROPONINI in the last 168 hours. BNP (last 3 results) No results for input(s): PROBNP in the last 8760 hours. HbA1C: No results for input(s): HGBA1C in the last 72 hours. CBG: Recent Labs  Lab 02/06/19 0857  GLUCAP 317*   Lipid Profile: No results for  input(s): CHOL, HDL, LDLCALC, TRIG, CHOLHDL, LDLDIRECT in the last 72 hours. Thyroid Function Tests: No results for input(s): TSH, T4TOTAL, FREET4, T3FREE, THYROIDAB in the last 72 hours. Anemia Panel: No results for input(s): VITAMINB12, FOLATE, FERRITIN, TIBC, IRON, RETICCTPCT in the last 72 hours. Urine analysis:    Component Value Date/Time   COLORURINE YELLOW 01/30/2019 0320   APPEARANCEUR CLEAR 01/30/2019 0320   LABSPEC 1.019 01/30/2019 0320   PHURINE 5.0 01/30/2019 0320   GLUCOSEU NEGATIVE 01/30/2019 0320   HGBUR NEGATIVE 01/30/2019 0320   BILIRUBINUR NEGATIVE 01/30/2019 0320   KETONESUR NEGATIVE 01/30/2019 0320   PROTEINUR 100 (A) 01/30/2019 0320   UROBILINOGEN 1.0 03/14/2015 0610   NITRITE NEGATIVE 01/30/2019 0320   LEUKOCYTESUR NEGATIVE 01/30/2019 0320    Radiological Exams on Admission: Dg Chest Port 1 View  Result Date: 02/12/2019  CLINICAL DATA:  Shortness of breath for 6 months. EXAM: PORTABLE CHEST 1 VIEW COMPARISON:  February 10, 2019 FINDINGS: Cardiomegaly. The left retrocardiac region is not well assessed. Scar atelectasis in the lingula. The hila, mediastinum, lungs, and pleura are otherwise normal. IMPRESSION: No acute abnormalities. Electronically Signed   By: Dorise Bullion III M.D   On: 02/12/2019 16:14    EKG: Independently reviewed. Sinus rhythm, with non specific T wave abnormalities.   Assessment/Plan Active Problems:   Acute CHF (congestive heart failure) (HCC)    SOB, pedal edema, scrotal edema, worsening of anasarca, : Possibly acute on chronic systolic and diastolic heart failure. Last echocardiogram from 10/2018 showed lvEF of 25 to 30%.  Admitted to telemetry and monitor overnight.  He denies any chest pain.  Pt reports non compliant to medications.  Start the patient on IV lasix 40 mg BID, replace potassium as appropriate.  Strict intake and output. Daily weights.  Fluid restriction to 1200 ml /day.    Scrotal edema and tenderness : Suspect  some degree of cellulitis.  Will start the patient on IV unasyn and monitor for improvement. If the scrotal edema does not improve with IV lasix , plan for scrotal ultrasound.    H/o of polysubstance abuse:  Get UDS.    Hypokalemia; replace as needed.  Get magnesium levels and repeat potassium tonight.   Hypertensive urgency: add IV hydralazine.  Pt denies any chest pain.    Non specific t wave changes:  - repeat EKG in am.  - check troponin.    Diabetes Mellitus: CBG (last 3)  No results for input(s): GLUCAP in the last 72 hours.  Resume SSI.   Severity of Illness: The appropriate patient status for this patient is OBSERVATION. Observation status is judged to be reasonable and necessary in order to provide the required intensity of service to ensure the patient's safety. The patient's presenting symptoms, physical exam findings, and initial radiographic and laboratory data in the context of their medical condition is felt to place them at decreased risk for further clinical deterioration. Furthermore, it is anticipated that the patient will be medically stable for discharge from the hospital within 2 midnights of admission. The following factors support the patient status of observation.   " The patient's presenting symptoms include sob and hypoxia requiring oxygen. . " The physical exam findings include pedal edema and scrotal edema.       DVT prophylaxis: lovenox.  Code Status: full code.  Family Communication: none at bedside.  Disposition Plan: pending clinical improvement.  Consults called: none.  Admission status: obs tele.   Hosie Poisson MD [facility] Pager [phone]  If 7PM-7AM, please contact night-coverage [email or web address] Password [facility]  02/12/2019, 8:03 PM

## 2019-02-12 NOTE — ED Notes (Addendum)
Per patient, last time the patient had Crack cocaine was 2 days ago, the last time he had alcohol was 3 days ago.

## 2019-02-12 NOTE — ED Triage Notes (Signed)
Per [facility] pt from Mrs Gilford Rile for scrotal pain that was not addressed at [facility] when he was there earlier. Reports pains for about week.

## 2019-02-12 NOTE — ED Notes (Signed)
Pt called out using the call light. Went in to his room and he requested his dinner tray be thrown away, socks removed and lotion applied to his lower legs/feet. Pt also requested a new urinal and new sheet. All requests were completed. Pt then asked what his status is regarding being discharged/admitted. Advised pt that I would ask his nurse to come speak with him when she had a moment.

## 2019-02-12 NOTE — ED Provider Notes (Signed)
[facility] EMERGENCY DEPARTMENT Provider Note   CSN: [id number] Arrival date & time: 02/11/19  1937     History   Chief Complaint Chief Complaint  Patient presents with  . Leg Pain    HPI [name] is a 58 y.o. male.     The history is provided by the patient.  Leg Pain Location:  Leg Injury: no   Leg location:  L leg and R leg Pain details:    Quality:  Aching   Radiates to:  Does not radiate   Severity:  Moderate   Onset quality:  Gradual   Timing:  Constant   Progression:  Unchanged Chronicity:  Chronic Foreign body present:  No foreign bodies Tetanus status:  Up to date Prior injury to area:  No Relieved by:  Nothing Worsened by:  Nothing Ineffective treatments:  Acetaminophen Associated symptoms: no back pain, no decreased ROM, no fatigue, no fever, no itching, no muscle weakness, no neck pain, no numbness, no stiffness and no tingling   Risk factors: obesity   Chronic edema and pain of BLE.  Seen this am and returned because the pain is still present.  No CP no DOE no SOB.  Not taking his home medication.    Past Medical History:  Diagnosis Date  . Chronic foot pain   . Cocaine abuse ([place])   . Diabetes mellitus without complication ([place])   . Hepatitis C    unsure   . Homelessness   . Hypertension   . Neuropathy   . Polysubstance abuse ([place])   . Schizophrenia ([place])   . Sleep apnea   . Systolic and diastolic CHF, chronic [facility])     Patient Active Problem List   Diagnosis Date Noted  . CHF (congestive heart failure), NYHA class III, acute on chronic, combined ([place]) 02/02/2019  . Scrotal edema 01/30/2019  . Combined systolic and diastolic heart failure ([place]) 01/19/2019  . Chest pain 01/16/2019  . 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  . Medically noncompliant   . Acute on chronic systolic CHF (congestive heart failure) ([place]) 01/02/2019  . Acute on chronic combined systolic (congestive) and diastolic (congestive) heart failure ([place])  01/02/2019  . Acute on chronic systolic heart failure ([place]) 12/26/2018  . Mobitz type II atrioventricular block 12/25/2018  . Acute exacerbation of CHF (congestive heart failure) ([place]) 12/22/2018  . Acute on chronic respiratory failure with hypoxia ([place]) 12/22/2018  . CHF exacerbation ([place]) 12/19/2018  . Acute CHF (congestive heart failure) ([place]) 12/16/2018  . Diabetes mellitus without complication ([place]) 74/25/9563  . Acute on chronic systolic (congestive) heart failure ([place]) 12/11/2018  . Abdominal pain 12/11/2018  . Nausea vomiting and diarrhea 12/11/2018  . Microcytic anemia 12/11/2018  . Evaluation by psychiatric service required   . MDD (major depressive disorder), severe ([place]) 11/25/2018  . Pressure injury of skin 11/08/2018  . Elevated troponin 10/18/2018  . HLD (hyperlipidemia) 10/18/2018  . Anxiety 10/18/2018  . CHF (congestive heart failure), NYHA class II, acute on chronic, combined ([place]) 10/18/2018  . Hypertensive urgency 10/12/2018  . Mild renal insufficiency 10/12/2018  . Cellulitis 10/12/2018  . Penile cellulitis   . Adjustment disorder with mixed disturbance of emotions and conduct   . Sleep apnea 10/03/2018  . Hypokalemia 09/17/2018  . Scrotal swelling 09/17/2018  . Urinary hesitancy   . Constipation   . Acute on chronic combined systolic and diastolic CHF (congestive heart failure) ([place]) 08/25/2018  . Acute respiratory failure with hypoxia ([place]) 08/25/2018  .  Tobacco use 08/18/2018  . Homelessness 08/08/2018  . Smoker 08/08/2018  . Prostate enlargement 03/16/2018  . Aortic atherosclerosis (HCC) 03/16/2018  . 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 Cocaine abuse with cocaine-induced mood disorder (HCC) 09/18/2017  . Chronic foot pain   . Schizoaffective disorder, bipolar type (HCC) 09/30/2016  . Substance induced mood disorder (HCC) 03/13/2015  . Schizophrenia, paranoid type (HCC) 01/17/2015  . Suicidal ideation   . Drug hallucinosis (HCC)  10/08/2014  . Chronic paranoid schizophrenia (HCC) 09/07/2014  . 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 Urinary retention   . Cocaine use disorder, severe, dependence (HCC)   . Essential hypertension 03/28/2013  . 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 MULTIPLE TOOTH EXTRACTIONS          Home Medications    Prior to Admission medications   Medication Sig Start Date End Date Taking? Authorizing Provider  acetaminophen (TYLENOL) 500 MG tablet Take 1 tablet (500 mg total) by mouth every 6 (six) hours as needed. Patient not taking: Reported on 01/19/2019 01/08/19   Fayrene Helperran, [place], PA-C  ARIPiprazole (ABILIFY) 10 MG tablet Take 1 tablet (10 mg total) by mouth daily. Patient not taking: Reported on 01/13/2019 12/10/18   Joycelyn DasPokhrel, Laxman, MD  carbamazepine (TEGRETOL) 200 MG tablet Take 0.5 tablets (100 mg total) by mouth 2 (two) times daily. Patient not taking: Reported on 01/15/2019 11/26/18   Mancel BaleWentz, Elliott, MD  furosemide (LASIX) 40 MG tablet Take 1 tablet (40 mg total) by mouth 2 (two) times daily. 01/29/19 02/28/19  Linwood DibblesKnapp, Jon, MD  furosemide (LASIX) 40 MG tablet Take 1 tablet (40 mg total) by mouth 2 (two) times daily. Patient not taking: Reported on 02/05/2019 02/04/19   Tegeler, Canary Brimhristopher J, MD  glipiZIDE (GLUCOTROL) 5 MG tablet Take 0.5 tablets (2.5 mg total) by mouth 2 (two) times daily before a meal. 01/01/19   Rai, Ripudeep K, MD  Ipratropium-Albuterol (COMBIVENT) 20-100 MCG/ACT AERS respimat Inhale 1 puff into the lungs every 6 (six) hours. Patient not taking: Reported on 01/15/2019 11/30/18   Iyona Pehrson, MD  lisinopril (ZESTRIL) 10 MG tablet Take 1 tablet (10 mg total) by mouth daily. Patient not taking: Reported on 01/15/2019 11/26/18   Mancel BaleWentz, Elliott, MD  potassium chloride SA (K-DUR) 20 MEQ tablet Take 1 tablet (20 mEq total)  by mouth 2 (two) times daily. Patient not taking: Reported on 01/15/2019 11/30/18   Nicanor AlconPalumbo, Alfonse Garringer, MD    Family History Family History  Problem Relation Age of Onset  . Hypertension Other   . Diabetes Other     Social History Social History   Tobacco Use  . Smoking status: Current Every Day Smoker    Packs/day: 1.00    Years: 20.00    Pack years: 20.00    Types: Cigarettes  . Smokeless tobacco: Current User  Substance Use Topics  . Alcohol use: Yes    Comment: Daily Drinker   . Drug use: Yes    Frequency: 7.0 times per week    Types: "Crack" cocaine, Cocaine, Marijuana    Comment: Crack     Allergies   Haldol [haloperidol]   Review of Systems Review of Systems  Constitutional: Negative for fatigue and fever.  HENT: Negative for congestion.   Eyes: Negative for visual disturbance.  Respiratory: Negative for chest tightness and shortness of breath.   Cardiovascular: Negative for  chest pain.  Gastrointestinal: Negative for abdominal pain.  Genitourinary: Negative for flank pain.  Musculoskeletal: Positive for arthralgias. Negative for back pain, neck pain and stiffness.  Skin: Negative for itching.  Psychiatric/Behavioral: Negative for agitation.  All other systems reviewed and are negative.    Physical Exam Updated Vital Signs BP (!) 169/105 (BP Location: Left Arm)   Pulse (!) 106   Temp 98.9 F (37.2 C) (Oral)   Resp 18   SpO2 93%   Physical Exam Vitals signs and nursing note reviewed.  Constitutional:      General: He is not in acute distress.    Appearance: He is normal weight.  HENT:     Head: Normocephalic and atraumatic.     Nose: Nose normal.  Eyes:     Conjunctiva/sclera: Conjunctivae normal.     Pupils: Pupils are equal, round, and reactive to light.  Neck:     Musculoskeletal: Normal range of motion and neck supple.  Cardiovascular:     Rate and Rhythm: Normal rate and regular rhythm.     Pulses: Normal pulses.     Heart sounds: Normal  heart sounds.  Pulmonary:     Effort: Pulmonary effort is normal. No respiratory distress.     Breath sounds: Normal breath sounds. No wheezing or rales.  Abdominal:     General: Abdomen is flat. Bowel sounds are normal.     Tenderness: There is no abdominal tenderness.  Musculoskeletal: Normal range of motion.  Skin:    General: Skin is warm and dry.     Capillary Refill: Capillary refill takes less than 2 seconds.  Neurological:     General: No focal deficit present.     Mental Status: He is alert and oriented to person, place, and time.  Psychiatric:        Mood and Affect: Mood normal.      ED Treatments / Results  Labs (all labs ordered are listed, but only abnormal results are displayed) Labs Reviewed - No data to display  EKG None  Radiology No results found.  Procedures Procedures (including critical care time)  Medications Ordered in ED Medications  acetaminophen (TYLENOL) tablet 1,000 mg (has no administration in time range)  furosemide (LASIX) tablet 40 mg (has no administration in time range)     Legs are improved from last visit when I saw him.  Seen this am and given IV medication.  I will give a dose of PO medication.  Well appearing.  No distress.  Stable for discharge.    Final Clinical Impressions(s) / ED Diagnoses      Return for intractable cough, coughing up blood,fevers >100.4 unrelieved by medication, shortness of breath, intractable vomiting, chest pain, shortness of breath, weakness,numbness, changes in speech, facial asymmetry,abdominal pain, passing out,Inability to tolerate liquids or food, cough, altered mental status or any concerns. No signs of systemic illness or infection. The patient is nontoxic-appearing on exam and vital signs are within normal limits.   I have reviewed the triage vital signs and the nursing notes. Pertinent labs &imaging results that were available during my care of the patient were reviewed by me and  considered in my medical decision making (see chart for details).  After history, exam, and medical workup I feel the patient has been appropriately medically screened and is safe for discharge home. Pertinent diagnoses were discussed with the patient. Patient was given return precautions    Shadavia Dampier, MD 02/12/19 [id number]

## 2019-02-12 NOTE — Progress Notes (Signed)
Pharmacy Antibiotic Note  [name] is a 58 y.o. male admitted on 02/12/2019 with cellulitis.  Pharmacy has been consulted for Unasyn dosing. Renal function at baseline, CrCl >197ml/min.   Plan: Unasyn 3gm IV q6h No dose adjustments anticipated- pharmacy to sign off.  Please re-consult if needed     Temp (24hrs), Avg:98.3 F (36.8 C), Min:98.3 F (36.8 C), Max:98.3 F (36.8 C)  Recent Labs  Lab 02/12/19 1549  WBC 8.2  CREATININE 0.83    Estimated Creatinine Clearance: 118.5 mL/min (by C-G formula based on SCr of 0.83 mg/dL).    Allergies  Allergen Reactions  . Haldol [Haloperidol] Other (See Comments)    Muscle spasms, loss of voluntary movement. However, pt has taken Thorazine on multiple occasions with no adverse effects.     Thank you for allowing pharmacy to be a part of this patient's care.  Biagio Borg 02/12/2019 9:02 PM

## 2019-02-13 LAB — BASIC METABOLIC PANEL
Anion gap: 8 (ref 5–15)
BUN: 14 mg/dL (ref 6–20)
CO2: 31 mmol/L (ref 22–32)
Calcium: 8.6 mg/dL — ABNORMAL LOW (ref 8.9–10.3)
Chloride: 99 mmol/L (ref 98–111)
Creatinine, Ser: 0.82 mg/dL (ref 0.61–1.24)
GFR calc Af Amer: >60 mL/min (ref >60)
GFR calc non Af Amer: >60 mL/min (ref >60)
Glucose, Bld: 239 mg/dL — ABNORMAL HIGH (ref 70–99)
Potassium: 3.6 mmol/L (ref 3.5–5.1)
Sodium: 138 mmol/L (ref 135–145)

## 2019-02-13 LAB — CBC
HCT: 30.2 % — ABNORMAL LOW (ref 39.0–52.0)
Hemoglobin: 8.3 g/dL — ABNORMAL LOW (ref 13.0–17.0)
MCH: 21.3 pg — ABNORMAL LOW (ref 26.0–34.0)
MCHC: 27.5 g/dL — ABNORMAL LOW (ref 30.0–36.0)
MCV: 77.6 fL — ABNORMAL LOW (ref 80.0–100.0)
Platelets: 342 10*3/uL (ref 150–400)
RBC: 3.89 MIL/uL — ABNORMAL LOW (ref 4.22–5.81)
RDW: 21.8 % — ABNORMAL HIGH (ref 11.5–15.5)
WBC: 6.3 10*3/uL (ref 4.0–10.5)
nRBC: 0 % (ref 0.0–0.2)

## 2019-02-13 LAB — URINALYSIS, ROUTINE W REFLEX MICROSCOPIC
Bacteria, UA: NONE SEEN
Bilirubin Urine: NEGATIVE
Glucose, UA: 50 mg/dL — AB
Hgb urine dipstick: NEGATIVE
Ketones, ur: NEGATIVE mg/dL
Leukocytes,Ua: NEGATIVE
Nitrite: NEGATIVE
Protein, ur: 100 mg/dL — AB
Specific Gravity, Urine: 1.019 (ref 1.005–1.030)
pH: 5 (ref 5.0–8.0)

## 2019-02-13 LAB — RAPID URINE DRUG SCREEN, HOSP PERFORMED
Amphetamines: NOT DETECTED
Barbiturates: NOT DETECTED
Benzodiazepines: NOT DETECTED
Cocaine: POSITIVE — AB
Opiates: NOT DETECTED
Tetrahydrocannabinol: NOT DETECTED

## 2019-02-13 LAB — CBG MONITORING, ED
Glucose-Capillary: 204 mg/dL — ABNORMAL HIGH (ref 70–99)
Glucose-Capillary: 334 mg/dL — ABNORMAL HIGH (ref 70–99)

## 2019-02-13 MED ORDER — TRAMADOL HCL 50 MG PO TABS: 100.0000 mg | ORAL_TABLET | Freq: Four times a day (QID) | ORAL | Status: DC | PRN

## 2019-02-13 NOTE — Progress Notes (Signed)
Pt calling out multiple times about wanting ice cream and crackers. RN informed he cannot have any of these due to his sugar. Started screaming at staff saying "we dont care about him".

## 2019-02-13 NOTE — Progress Notes (Addendum)
Pt stating he wants to leave AMA. He says the "nursing staff does not care about him and we are only doing this to him because he is black". RN informed pt we have to follow MD orders and the pt needs to follow the rules on the unit while he is a patient on the floor. PT states "I'm leaving, I've left AMA before and I'm going to do it again." MD was informed of pt leaving AMA. AMA papers were given to the pt and pt was escorted by wheelchair downstairs.

## 2019-02-13 NOTE — ED Notes (Signed)
Pt provided with 118.42mL of diet [place].

## 2019-02-13 NOTE — ED Notes (Signed)
ED TO INPATIENT HANDOFF REPORT  ED Nurse Name and Phone #: [name] [phone]  S Name/Age/Gender [name] 58 y.o. male Room/Bed: WA17/WA17  Code Status   Code Status: Full Code  Home/SNF/Other Home Patient oriented to: self Is this baseline? Yes   Triage Complete: Triage complete  Chief Complaint scrotal pain  Triage Note Per [facility] pt from [name] for scrotal pain that was not addressed at [facility] when he was there earlier. Reports pains for about week.   Allergies Allergies  Allergen Reactions  . Haldol [Haloperidol] Other (See Comments)    Muscle spasms, loss of voluntary movement. However, pt has taken Thorazine on multiple occasions with no adverse effects.     Level of Care/Admitting Diagnosis ED Disposition    ED Disposition Condition Comment   Admit  Hospital Area: [facility] [[id number]]  Level of Care: Telemetry [5]  Admit to tele based on following criteria: Acute CHF  Covid Evaluation: N/A  Diagnosis: Acute CHF (congestive heart failure) (HCC) [454098]  Admitting Physician: Kathlen Mody [[id number]]  Attending Physician: Kathlen Mody [[id number]]  PT Class (Do Not Modify): Observation [104]  PT Acc Code (Do Not Modify): Observation [10022]       B Medical/Surgery History Past Medical History:  Diagnosis Date  . Chronic foot pain   . Cocaine abuse (HCC)   . Diabetes mellitus without complication (HCC)   . Hepatitis C    unsure   . Homelessness   . Hypertension   . Neuropathy   . Polysubstance abuse (HCC)   . Schizophrenia (HCC)   . Sleep apnea   . Systolic and diastolic CHF, chronic (HCC)    Past Surgical History:  Procedure Laterality Date  . MULTIPLE TOOTH EXTRACTIONS       A IV Location/Drains/Wounds Patient Lines/Drains/Airways Status   Active Line/Drains/Airways    Name:   Placement date:   Placement time:   Site:   Days:   Peripheral IV 02/12/19 Left Forearm   02/12/19    1548    Forearm   1           Intake/Output Last 24 hours  Intake/Output Summary (Last 24 hours) at 02/13/2019 1140 Last data filed at 02/13/2019 1191 Gross per 24 hour  Intake 1256.34 ml  Output 2050 ml  Net -793.66 ml    Labs/Imaging Results for orders placed or performed during the hospital encounter of 02/12/19 (from the past 48 hour(s))  Brain natriuretic peptide     Status: Abnormal   Collection Time: 02/12/19  3:49 PM  Result Value Ref Range   B Natriuretic Peptide 924.1 (H) 0.0 - 100.0 pg/mL    Comment: Performed at [facility], [address] [place], [place] [address]  CBC with Differential/Platelet     Status: Abnormal   Collection Time: 02/12/19  3:49 PM  Result Value Ref Range   WBC 8.2 4.0 - 10.5 K/uL   RBC 3.94 (L) 4.22 - 5.81 MIL/uL   Hemoglobin 8.4 (L) 13.0 - 17.0 g/dL    Comment: Reticulocyte Hemoglobin testing may be clinically indicated, consider ordering this additional test [id number]    HCT 30.2 (L) 39.0 - 52.0 %   MCV 76.6 (L) 80.0 - 100.0 fL   MCH 21.3 (L) 26.0 - 34.0 pg   MCHC 27.8 (L) 30.0 - 36.0 g/dL   RDW 57.8 (H) 46.9 - 62.9 %   Platelets 334 150 - 400 K/uL   nRBC 0.0 0.0 - 0.2 %  Neutrophils Relative % 78 %   Neutro Abs 6.5 1.7 - 7.7 K/uL   Lymphocytes Relative 10 %   Lymphs Abs 0.8 0.7 - 4.0 K/uL   Monocytes Relative 10 %   Monocytes Absolute 0.8 0.1 - 1.0 K/uL   Eosinophils Relative 1 %   Eosinophils Absolute 0.0 0.0 - 0.5 K/uL   Basophils Relative 1 %   Basophils Absolute 0.0 0.0 - 0.1 K/uL   Immature Granulocytes 0 %   Abs Immature Granulocytes 0.03 0.00 - 0.07 K/uL   Target Cells PRESENT     Comment: Performed at [facility], [address] [place], [place] [address]  Comprehensive metabolic panel     Status: Abnormal   Collection Time: 02/12/19  3:49 PM  Result Value Ref Range   Sodium 138 135 - 145 mmol/L   Potassium 2.8 (L) 3.5 - 5.1 mmol/L   Chloride 98 98 - 111 mmol/L   CO2 31 22 - 32 mmol/L   Glucose, Bld  318 (H) 70 - 99 mg/dL   BUN 13 6 - 20 mg/dL   Creatinine, Ser 6.040.83 0.61 - 1.24 mg/dL   Calcium 8.3 (L) 8.9 - 10.3 mg/dL   Total Protein 7.0 6.5 - 8.1 g/dL   Albumin 2.7 (L) 3.5 - 5.0 g/dL   AST 21 15 - 41 U/L   ALT 12 0 - 44 U/L   Alkaline Phosphatase 177 (H) 38 - 126 U/L   Total Bilirubin 0.6 0.3 - 1.2 mg/dL   GFR calc non Af Amer >60 >60 mL/min   GFR calc Af Amer >60 >60 mL/min   Anion gap 9 5 - 15    Comment: Performed at [facility], [address] [place], [place] [address]  Magnesium     Status: None   Collection Time: 02/12/19  3:49 PM  Result Value Ref Range   Magnesium 1.7 1.7 - 2.4 mg/dL    Comment: Performed at [facility], [address] [place], [place] [address]  SARS Coronavirus 2 [facility](Hospital order, Performed in [facility] hospital lab) Nasopharyngeal Nasopharyngeal Swab     Status: None   Collection Time: 02/12/19  4:51 PM   Specimen: Nasopharyngeal Swab  Result Value Ref Range   SARS Coronavirus 2 NEGATIVE NEGATIVE    Comment: (NOTE) If result is NEGATIVE SARS-CoV-2 target nucleic acids are NOT DETECTED. The SARS-CoV-2 RNA is generally detectable in upper and lower  respiratory specimens during the acute phase of infection. The lowest  concentration of SARS-CoV-2 viral copies this assay can detect is 250  copies / mL. A negative result does not preclude SARS-CoV-2 infection  and should not be used as the sole basis for treatment or other  patient management decisions.  A negative result may occur with  improper specimen collection / handling, submission of specimen other  than nasopharyngeal swab, presence of viral mutation(s) within the  areas targeted by this assay, and inadequate number of viral copies  (<250 copies / mL). A negative result must be combined with clinical  observations, patient history, and epidemiological information. If result is POSITIVE SARS-CoV-2 target nucleic acids are DETECTED. The SARS-CoV-2  RNA is generally detectable in upper and lower  respiratory specimens dur ing the acute phase of infection.  Positive  results are indicative of active infection with SARS-CoV-2.  Clinical  correlation with patient history and other diagnostic information is  necessary to determine patient infection status.  Positive results do  not  rule out bacterial infection or co-infection with other viruses. If result is PRESUMPTIVE POSTIVE SARS-CoV-2 nucleic acids MAY BE PRESENT.   A presumptive positive result was obtained on the submitted specimen  and confirmed on repeat testing.  While 2019 novel coronavirus  (SARS-CoV-2) nucleic acids may be present in the submitted sample  additional confirmatory testing may be necessary for epidemiological  and / or clinical management purposes  to differentiate between  SARS-CoV-2 and other Sarbecovirus currently known to infect humans.  If clinically indicated additional testing with an alternate test  methodology [phone]([id number]) is advised. The SARS-CoV-2 RNA is generally  detectable in upper and lower respiratory sp ecimens during the acute  phase of infection. The expected result is Negative. Fact Sheet for Patients:  BoilerBrush.com.cyhttps://www.fda.gov/media/136312/download Fact Sheet for Healthcare Providers: https://pope.com/https://www.fda.gov/media/136313/download This test is not yet approved or cleared by the [place] FDA and has been authorized for detection and/or diagnosis of SARS-CoV-2 by FDA under an Emergency Use Authorization (EUA).  This EUA will remain in effect (meaning this test can be used) for the duration of the COVID-19 declaration under Section 564(b)(1) of the Act, 21 U.S.C. section 360bbb-3(b)(1), unless the authorization is terminated or revoked sooner. Performed at [facility], [address] [place], [place] [address]   Basic metabolic panel     Status: Abnormal   Collection Time: 02/12/19  9:30 PM  Result Value Ref Range   Sodium  141 135 - 145 mmol/L   Potassium 3.2 (L) 3.5 - 5.1 mmol/L   Chloride 100 98 - 111 mmol/L   CO2 32 22 - 32 mmol/L   Glucose, Bld 254 (H) 70 - 99 mg/dL   BUN 13 6 - 20 mg/dL   Creatinine, Ser 8.110.87 0.61 - 1.24 mg/dL   Calcium 8.5 (L) 8.9 - 10.3 mg/dL   GFR calc non Af Amer >60 >60 mL/min   GFR calc Af Amer >60 >60 mL/min   Anion gap 9 5 - 15    Comment: Performed at [facility], [address] [place], [place] [address]  CBG monitoring, ED     Status: Abnormal   Collection Time: 02/12/19 10:16 PM  Result Value Ref Range   Glucose-Capillary 242 (H) 70 - 99 mg/dL  Urine rapid drug screen (hosp performed)     Status: Abnormal   Collection Time: 02/13/19  4:08 AM  Result Value Ref Range   Opiates NONE DETECTED NONE DETECTED   Cocaine POSITIVE (A) NONE DETECTED   Benzodiazepines NONE DETECTED NONE DETECTED   Amphetamines NONE DETECTED NONE DETECTED   Tetrahydrocannabinol NONE DETECTED NONE DETECTED   Barbiturates NONE DETECTED NONE DETECTED    Comment: (NOTE) DRUG SCREEN FOR MEDICAL PURPOSES ONLY.  IF CONFIRMATION IS NEEDED FOR ANY PURPOSE, NOTIFY LAB WITHIN 5 DAYS. LOWEST DETECTABLE LIMITS FOR URINE DRUG SCREEN Drug Class                     Cutoff (ng/mL) Amphetamine and metabolites    1000 Barbiturate and metabolites    200 Benzodiazepine                 200 Tricyclics and metabolites     300 Opiates and metabolites        300 Cocaine and metabolites        300 THC  50 Performed at [facility], [place] [address], [place], [place] [address]   Urinalysis, Routine w reflex microscopic     Status: Abnormal   Collection Time: 02/13/19  4:08 AM  Result Value Ref Range   Color, Urine YELLOW YELLOW   APPearance CLEAR CLEAR   Specific Gravity, Urine 1.019 1.005 - 1.030   pH 5.0 5.0 - 8.0   Glucose, UA 50 (A) NEGATIVE mg/dL   Hgb urine dipstick NEGATIVE NEGATIVE   Bilirubin Urine NEGATIVE NEGATIVE   Ketones, ur  NEGATIVE NEGATIVE mg/dL   Protein, ur 100 (A) NEGATIVE mg/dL   Nitrite NEGATIVE NEGATIVE   Leukocytes,Ua NEGATIVE NEGATIVE   RBC / HPF 0-5 0 - 5 RBC/hpf   WBC, UA 0-5 0 - 5 WBC/hpf   Bacteria, UA NONE SEEN NONE SEEN    Comment: Performed at [facility], [place] [address], [place], [place] [address]  CBC     Status: Abnormal   Collection Time: 02/13/19  5:00 AM  Result Value Ref Range   WBC 6.3 4.0 - 10.5 K/uL   RBC 3.89 (L) 4.22 - 5.81 MIL/uL   Hemoglobin 8.3 (L) 13.0 - 17.0 g/dL    Comment: Reticulocyte Hemoglobin testing may be clinically indicated, consider ordering this additional test [id number]    HCT 30.2 (L) 39.0 - 52.0 %   MCV 77.6 (L) 80.0 - 100.0 fL   MCH 21.3 (L) 26.0 - 34.0 pg   MCHC 27.5 (L) 30.0 - 36.0 g/dL   RDW 21.8 (H) 11.5 - 15.5 %   Platelets 342 150 - 400 K/uL   nRBC 0.0 0.0 - 0.2 %    Comment: Performed at [facility], [place] [address] [place], [place] [address]  Basic metabolic panel     Status: Abnormal   Collection Time: 02/13/19  5:00 AM  Result Value Ref Range   Sodium 138 135 - 145 mmol/L   Potassium 3.6 3.5 - 5.1 mmol/L   Chloride 99 98 - 111 mmol/L   CO2 31 22 - 32 mmol/L   Glucose, Bld 239 (H) 70 - 99 mg/dL   BUN 14 6 - 20 mg/dL   Creatinine, Ser 0.82 0.61 - 1.24 mg/dL   Calcium 8.6 (L) 8.9 - 10.3 mg/dL   GFR calc non Af Amer >60 >60 mL/min   GFR calc Af Amer >60 >60 mL/min   Anion gap 8 5 - 15    Comment: Performed at [facility], [place] [address] [place], [place] [address]  CBG monitoring, ED     Status: Abnormal   Collection Time: 02/13/19  8:01 AM  Result Value Ref Range   Glucose-Capillary 204 (H) 70 - 99 mg/dL  CBG monitoring, ED     Status: Abnormal   Collection Time: 02/13/19 11:31 AM  Result Value Ref Range   Glucose-Capillary 334 (H) 70 - 99 mg/dL   Dg Chest Port 1 View  Result Date: 02/12/2019 CLINICAL DATA:  Shortness of breath for 6 months. EXAM: PORTABLE CHEST 1 VIEW  COMPARISON:  February 10, 2019 FINDINGS: Cardiomegaly. The left retrocardiac region is not well assessed. Scar atelectasis in the lingula. The hila, mediastinum, lungs, and pleura are otherwise normal. IMPRESSION: No acute abnormalities. Electronically Signed   By: Dorise Bullion III M.D   On: 02/12/2019 16:14    Pending Labs Unresulted Labs (From admission, onward)    Start     Ordered   02/19/19 0500  Creatinine, serum  (  enoxaparin (LOVENOX)    CrCl >/= 30 ml/min)  Weekly,   R    Comments: while on enoxaparin therapy    02/12/19 2135          Vitals/Pain Today's Vitals   02/13/19 0945 02/13/19 1100 02/13/19 1125 02/13/19 1125  BP:  (!) 162/118    Pulse: 100 (!) 103    Resp: 17 (!) 22    Temp:      TempSrc:      SpO2: 91% (!) 72%    PainSc:   Asleep Asleep    Isolation Precautions No active isolations  Medications Medications  acetaminophen (TYLENOL) tablet 500 mg (500 mg Oral Given 02/13/19 1009)  ARIPiprazole (ABILIFY) tablet 10 mg (10 mg Oral Given 02/13/19 1009)  carbamazepine (TEGRETOL) tablet 100 mg (100 mg Oral Given 02/13/19 1137)  potassium chloride SA (K-DUR) CR tablet 20 mEq (20 mEq Oral Given 02/13/19 0814)  enoxaparin (LOVENOX) injection 40 mg (40 mg Subcutaneous Given 02/12/19 2228)  hydrALAZINE (APRESOLINE) injection 5 mg (has no administration in time range)  insulin aspart (novoLOG) injection 0-9 Units (7 Units Subcutaneous Given 02/13/19 1136)  insulin aspart (novoLOG) injection 0-5 Units (2 Units Subcutaneous Given 02/12/19 2228)  furosemide (LASIX) injection 40 mg (40 mg Intravenous Given 02/13/19 0815)  Ampicillin-Sulbactam (UNASYN) 3 g in sodium chloride 0.9 % 100 mL IVPB (0 g Intravenous Stopped 02/13/19 0900)  acetaminophen (TYLENOL) tablet 650 mg (650 mg Oral Given 02/12/19 1534)  furosemide (LASIX) injection 40 mg (40 mg Intravenous Given 02/12/19 1548)  potassium chloride SA (K-DUR) CR tablet 40 mEq (40 mEq Oral Given 02/12/19 1811)  potassium chloride 10  mEq in 100 mL IVPB (0 mEq Intravenous Stopped 02/12/19 1908)  Ampicillin-Sulbactam (UNASYN) 3 g in sodium chloride 0.9 % 100 mL IVPB (0 g Intravenous Stopped 02/12/19 2139)    Mobility walks     Focused Assessments    R Recommendations: See Admitting Provider Note  Report given to:   Additional Notes:

## 2019-02-13 NOTE — ED Notes (Signed)
Pt screaming at this writer because he has not received any juice. Pt stated, "That stupid ass woman wouldn't give me dag gon juice."

## 2019-02-13 NOTE — ED Notes (Signed)
Pt has been sleeping for the last hour, continue to monitor

## 2019-02-13 NOTE — Progress Notes (Signed)
Pt becoming irriate with staff because he wants chocolate ice cream. RN informed pt that he is not allowed to have sugar due to his elevated blood sugar. CBG in 300's. Pt starts cussing and saying "we don't care about him."

## 2019-02-13 NOTE — ED Notes (Signed)
Incorrect spo2 should have read 96% on 2 liters

## 2019-02-14 ENCOUNTER — Emergency Department (HOSPITAL_COMMUNITY)
Admission: EM | Admit: 2019-02-14 | Discharge: 2019-02-14 | Discharge status: Against Medical Advice | Payer: Medicaid Other | Attending: Emergency Medicine | Admitting: Emergency Medicine

## 2019-02-14 ENCOUNTER — Encounter (HOSPITAL_COMMUNITY): Payer: Self-pay | Admitting: Emergency Medicine

## 2019-02-14 DIAGNOSIS — F17210 Nicotine dependence, cigarettes, uncomplicated: Secondary | ICD-10-CM | POA: Insufficient documentation

## 2019-02-14 DIAGNOSIS — Z5329 Procedure and treatment not carried out because of patient's decision for other reasons: Secondary | ICD-10-CM | POA: Diagnosis not present

## 2019-02-14 DIAGNOSIS — I110 Hypertensive heart disease with heart failure: Secondary | ICD-10-CM | POA: Diagnosis not present

## 2019-02-14 DIAGNOSIS — R2243 Localized swelling, mass and lump, lower limb, bilateral: Secondary | ICD-10-CM | POA: Diagnosis not present

## 2019-02-14 DIAGNOSIS — I5042 Chronic combined systolic (congestive) and diastolic (congestive) heart failure: Secondary | ICD-10-CM | POA: Diagnosis not present

## 2019-02-14 DIAGNOSIS — R0602 Shortness of breath: Secondary | ICD-10-CM | POA: Diagnosis present

## 2019-02-14 DIAGNOSIS — N5089 Other specified disorders of the male genital organs: Secondary | ICD-10-CM | POA: Insufficient documentation

## 2019-02-14 DIAGNOSIS — E119 Type 2 diabetes mellitus without complications: Secondary | ICD-10-CM | POA: Insufficient documentation

## 2019-02-14 IMAGING — DX DG CHEST 2V
2 series · 2 of 2 positions shown · non-contrast
Comparison: 07/30/2018

CLINICAL DATA: Shortness of breath

EXAM:
CHEST - 2 VIEW

[chest ap]
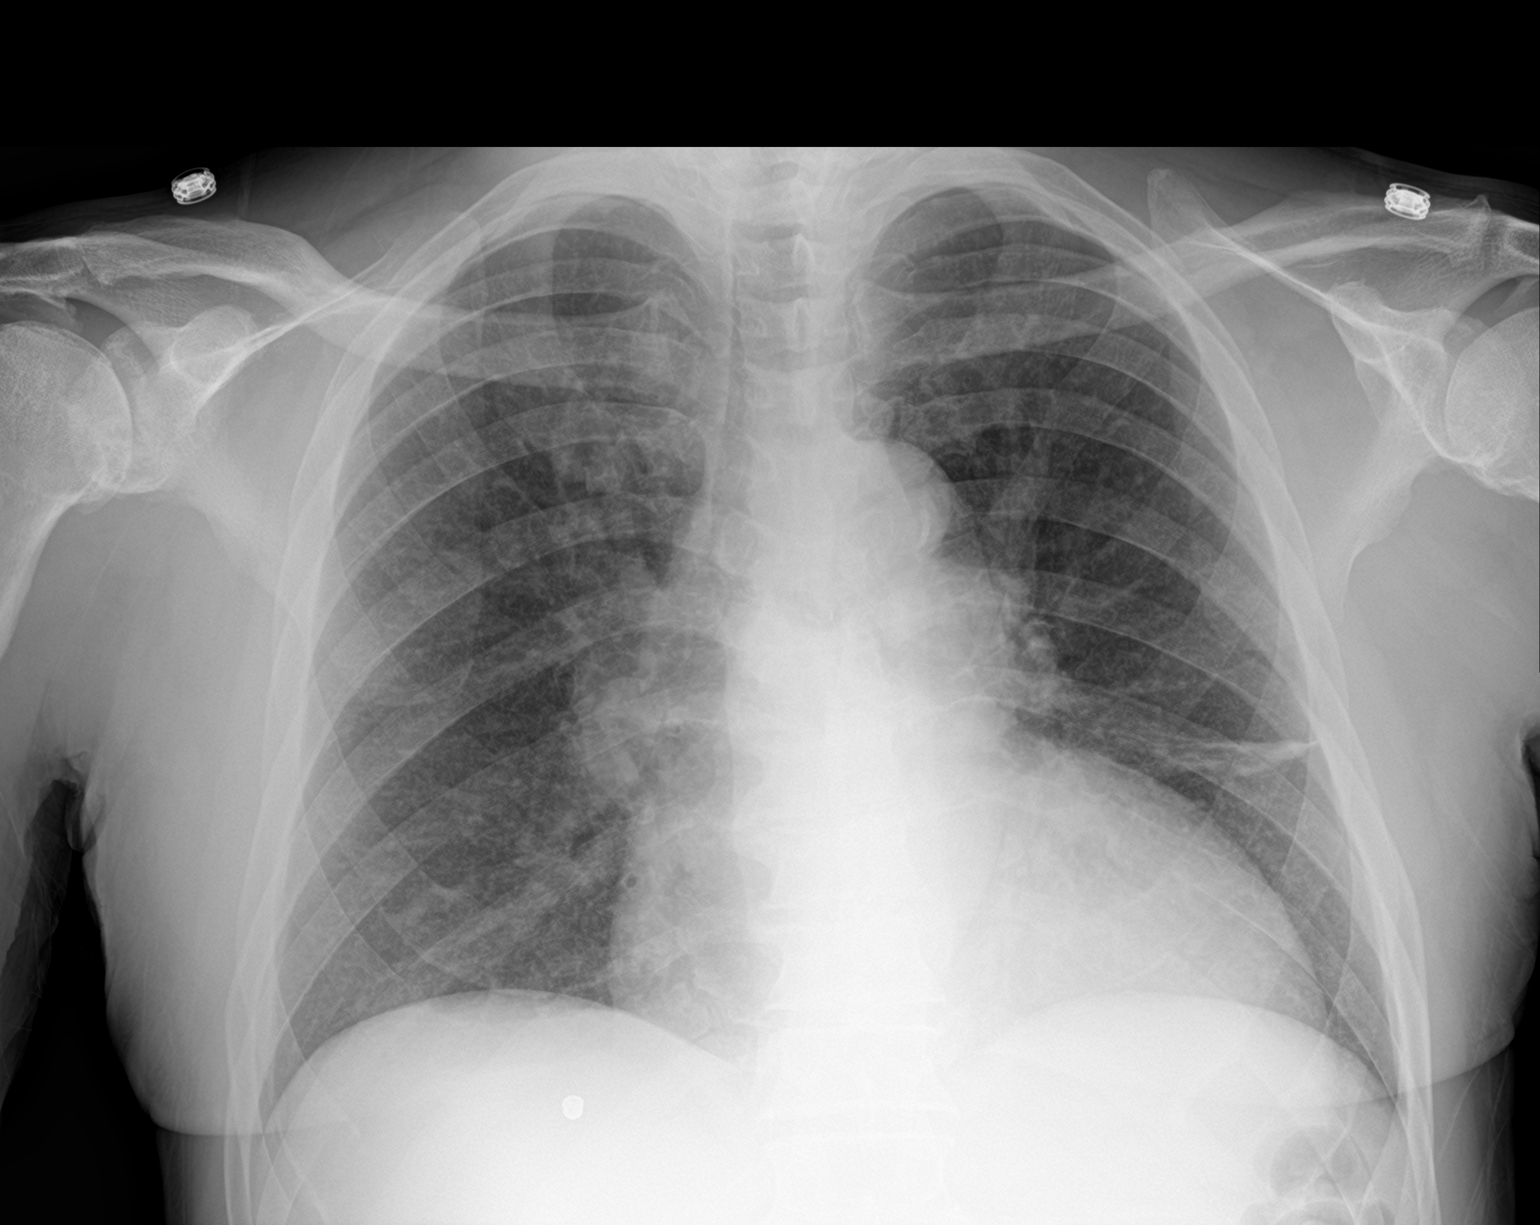

[chest lat]
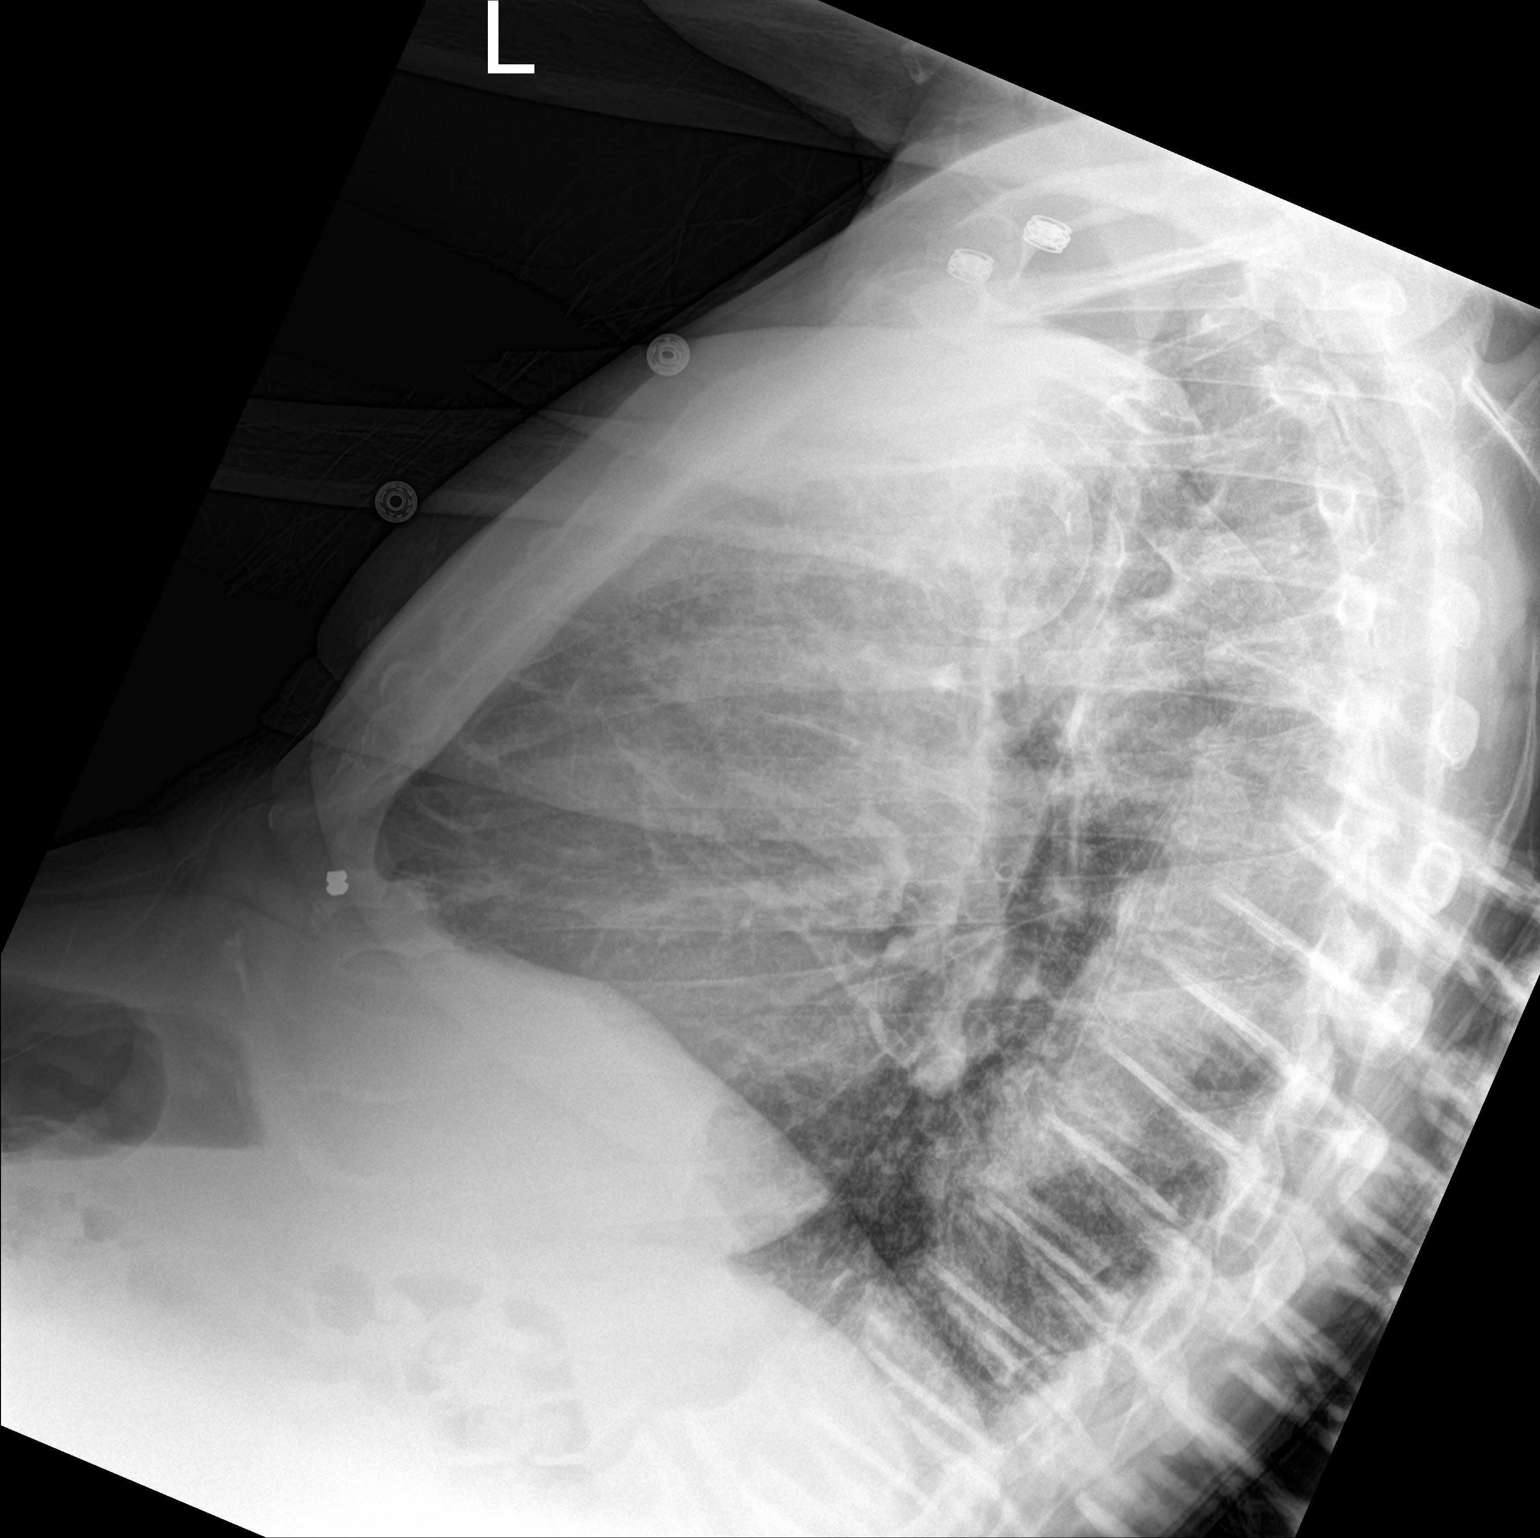

[2 of 2 positions shown; findings below may reference images not displayed]

FINDINGS: Cardiomegaly. Improved vascular congestion. Chronic scarring in the
left mid lung. There is no edema, consolidation, effusion, or
pneumothorax. Aortic tortuosity. Chronic metallic foreign body along
the anterior right base.
IMPRESSION: Baseline appearance of the chest.  No evidence of acute disease.

## 2019-02-14 MED ORDER — FUROSEMIDE 40 MG PO TABS
60.0000 mg | ORAL_TABLET | Freq: Once | ORAL | Status: AC
  Administered 2019-02-14: 60 mg via ORAL
  Filled 2019-02-14: qty 2

## 2019-02-14 NOTE — ED Notes (Signed)
Pt yelled he wants tylenol and sprite and wants to go. Provider notified.

## 2019-02-14 NOTE — ED Notes (Signed)
Pt leaving AMA. 

## 2019-02-14 NOTE — ED Triage Notes (Signed)
Pt comes to ed via ems, c/o sob, pt was here last night. Sob started this morning  again. Pt left the ed and did cocaine. Pt hx of CHF and SOB. V/s on arrival bp 168/106, hr 110, temp 97.7, cbg 534, 100 NRB.

## 2019-02-14 NOTE — ED Provider Notes (Signed)
[facility] Emergency Department Provider Note MRN:  [id number]  Arrival date & time: 02/14/19     Chief Complaint   Shortness of Breath   History of Present Illness   [name] is a 58 y.o. year-old male with a history of polysubstance abuse, diabetes, CHF presenting to the ED with chief complaint of shortness of breath.  Patient complaining of continued shortness of breath, fluid on his legs, wants to be admitted.  Denies headache, no chest pain, no abdominal pain.  Pain is mild, constant, no exacerbating relieving factors.  Patient is not taking any of his vacations.  Patient has been using "dope".  Review of Systems  A complete 10 system review of systems was obtained and all systems are negative except as noted in the HPI and PMH.   Patient's Health History    Past Medical History:  Diagnosis Date  . Chronic foot pain   . Cocaine abuse (HCC)   . Diabetes mellitus without complication (HCC)   . Hepatitis C    unsure   . Homelessness   . Hypertension   . Neuropathy   . Polysubstance abuse (HCC)   . Schizophrenia (HCC)   . Sleep apnea   . Systolic and diastolic CHF, chronic (HCC)     Past Surgical History:  Procedure Laterality Date  . MULTIPLE TOOTH EXTRACTIONS      Family History  Problem Relation Age of Onset  . Hypertension Other   . Diabetes Other     Social History   Socioeconomic History  . Marital status: Single    Spouse name: Not on file  . Number of children: Not on file  . Years of education: Not on file  . Highest education level: Not on file  Occupational History  . Not on file  Social Needs  . Financial resource strain: Not on file  . Food insecurity    Worry: Not on file    Inability: Not on file  . Transportation needs    Medical: Not on file    Non-medical: Not on file  Tobacco Use  . Smoking status: Current Every Day Smoker    Packs/day: 1.00    Years: 20.00    Pack years: 20.00    Types: Cigarettes   . Smokeless tobacco: Current User  Substance and Sexual Activity  . Alcohol use: Yes    Comment: Daily Drinker   . Drug use: Yes    Frequency: 7.0 times per week    Types: "Crack" cocaine, Cocaine, Marijuana    Comment: Crack  . Sexual activity: Not Currently  Lifestyle  . Physical activity    Days per week: Not on file    Minutes per session: Not on file  . Stress: Not on file  Relationships  . Social Musicianconnections    Talks on phone: Not on file    Gets together: Not on file    Attends religious service: Not on file    Active member of club or organization: Not on file    Attends meetings of clubs or organizations: Not on file    Relationship status: Not on file  . Intimate partner violence    Fear of current or ex partner: Not on file    Emotionally abused: Not on file    Physically abused: Not on file    Forced sexual activity: Not on file  Other Topics Concern  . Not on file  Social History Narrative   ** Merged  History Encounter **         Physical Exam  Vital Signs and Nursing Notes reviewed Vitals:   02/14/19 2032  BP: (!) 155/99  Pulse: (!) 110  Resp: 20  Temp: 98.6 F (37 C)  SpO2: 93%    CONSTITUTIONAL: Chronically ill-appearing, NAD NEURO:  Alert and oriented x 3, no focal deficits EYES:  eyes equal and reactive ENT/NECK:  no LAD, no JVD CARDIO: Tachycardic rate, well-perfused, normal S1 and S2 PULM:  CTAB no wheezing or rhonchi GI/GU:  normal bowel sounds, non-distended, non-tender; mild to moderate edema of the scrotum MSK/SPINE:  No gross deformities, 2+ pitting edema to bilateral lower extremities SKIN:  no rash, atraumatic PSYCH:  Appropriate speech and behavior  Diagnostic and Interventional Summary    Labs Reviewed - No data to display  No orders to display    Medications  furosemide (LASIX) tablet 60 mg (60 mg Oral Given 02/14/19 2104)     Procedures Critical Care  ED Course and Medical Decision Making  I have reviewed the triage  vital signs and the nursing notes.  Pertinent labs & imaging results that were available during my care of the patient were reviewed by me and considered in my medical decision making (see below for details).  Suspect malingering in this 58 year old male with nearly daily ED visits, consistently complaining of shortness of breath or edema.  Patient is speaking in long sentences/paragraphs, no increased work of breathing, normal oxygen saturation, mild tachycardia but on my assessment 106 and has been using stimulants today.  According to chart review his swelling seems to be chronic and he is noncompliant with his Lasix.  No indication for admission.  After patient was made aware of this, he requested a Sprite, some food, and then requested to leave.  Patient eloped from the emergency department.  Barth Kirks. Sedonia Small, MD [place] [email or web address]  Final Clinical Impressions(s) / ED Diagnoses     ICD-10-CM   1. Shortness of breath  R06.02     ED Discharge Orders    None         Maudie Flakes, MD 02/14/19 2312

## 2019-02-15 NOTE — Discharge Summary (Signed)
Physician Discharge Summary  [name] VQQ:[id number] DOB: 15-Apr-1961 DOA: 02/12/2019  PCP: Patient, No Pcp Per  Admit date: 02/12/2019 Discharge date: 02/13/2019   Recommendations for Outpatient Follow-up:  Pt left AMA.   Brief/Interim Summary:   Discharge Diagnoses:  Active Problems:   Acute CHF (congestive heart failure) ([place])  [name] is a 58 y.o. male with medical history significant of polysubstance, chronic systolic heart failure with LVEF of 25%  , non compliant to medications,  DM, anasarca, recently discharged from the hospital on 8/4 presents 8/14 with worsening scrotal edema, pedal edema and sob. He was admitted for CHF.he was started on IV lasix BID. Pt before he can be seen, pt left AMA.   Discharge Instructions   Allergies as of 02/13/2019      Reactions   Haldol [haloperidol] Other (See Comments)   Muscle spasms, loss of voluntary movement. However, pt has taken Thorazine on multiple occasions with no adverse effects.       Medication List    ASK your doctor about these medications   acetaminophen 500 MG tablet Commonly known as: TYLENOL Take 1 tablet (500 mg total) by mouth every 6 (six) hours as needed.   ARIPiprazole 10 MG tablet Commonly known as: Abilify Take 1 tablet (10 mg total) by mouth daily.   carbamazepine 200 MG tablet Commonly known as: TEGRETOL Take 0.5 tablets (100 mg total) by mouth 2 (two) times daily.   furosemide 40 MG tablet Commonly known as: LASIX Take 1 tablet (40 mg total) by mouth 2 (two) times daily.   furosemide 40 MG tablet Commonly known as: LASIX Take 1 tablet (40 mg total) by mouth 2 (two) times daily.   glipiZIDE 5 MG tablet Commonly known as: GLUCOTROL Take 0.5 tablets (2.5 mg total) by mouth 2 (two) times daily before a meal.   Ipratropium-Albuterol 20-100 MCG/ACT Aers respimat Commonly known as: COMBIVENT Inhale 1 puff into the lungs every 6 (six) hours.   lisinopril 10 MG  tablet Commonly known as: ZESTRIL Take 1 tablet (10 mg total) by mouth daily.   potassium chloride SA 20 MEQ tablet Commonly known as: K-DUR Take 1 tablet (20 mEq total) by mouth 2 (two) times daily.       Allergies  Allergen Reactions  . Haldol [Haloperidol] Other (See Comments)    Muscle spasms, loss of voluntary movement. However, pt has taken Thorazine on multiple occasions with no adverse effects.     Consultations:  None.    Procedures/Studies: Dg Chest 2 View  Result Date: 02/01/2019 CLINICAL DATA:  Shortness of breath EXAM: CHEST - 2 VIEW COMPARISON:  01/30/2019 FINDINGS: Small pleural effusions. Cardiomegaly with vascular congestion and mild interstitial edema. Patchy atelectasis left base. Aortic atherosclerosis. No pneumothorax. IMPRESSION: Cardiomegaly with small pleural effusions, vascular congestion and mild interstitial edema. Electronically Signed   By: Donavan Foil M.D.   On: 02/01/2019 21:16   Dg Chest Port 1 View  Result Date: 02/12/2019 CLINICAL DATA:  Shortness of breath for 6 months. EXAM: PORTABLE CHEST 1 VIEW COMPARISON:  February 10, 2019 FINDINGS: Cardiomegaly. The left retrocardiac region is not well assessed. Scar atelectasis in the lingula. The hila, mediastinum, lungs, and pleura are otherwise normal. IMPRESSION: No acute abnormalities. Electronically Signed   By: Dorise Bullion III M.D   On: 02/12/2019 16:14   Dg Chest Port 1 View  Result Date: 02/10/2019 CLINICAL DATA:  Shortness of breath EXAM: PORTABLE CHEST 1 VIEW COMPARISON:  02/05/2019 FINDINGS: Cardiomegaly. No  frank interstitial edema. Left lung base is obscured, possibly reflecting an trace left pleural effusion. Lingular scarring/atelectasis. No pneumothorax. IMPRESSION: Cardiomegaly. No frank interstitial edema. Possible trace left pleural effusion. Electronically Signed   By: Charline BillsSriyesh  Krishnan M.D.   On: 02/10/2019 01:10   Dg Chest Port 1 View  Result Date: 02/05/2019 CLINICAL DATA:   Shortness of breath EXAM: PORTABLE CHEST 1 VIEW COMPARISON:  February 04, 2019 FINDINGS: There is cardiomegaly with pulmonary venous hypertension. There is atelectatic change in the left mid lung region. There is no frank edema or consolidation. There is a small left pleural effusion. There is aortic atherosclerosis. No adenopathy. No bone lesions. IMPRESSION: Cardiomegaly with pulmonary vascular congestion. Small left pleural effusion. Stable atelectasis left mid lung. No frank edema or consolidation. Aortic Atherosclerosis (ICD10-I70.0). Electronically Signed   By: Bretta BangWilliam  Woodruff III M.D.   On: 02/05/2019 08:17   Dg Chest Port 1 View  Result Date: 02/04/2019 CLINICAL DATA:  Shortness of breath EXAM: PORTABLE CHEST 1 VIEW COMPARISON:  Three days ago FINDINGS: Cardiomegaly. Vascular congestion at the hila. Generalized interstitial coarsening. Band of atelectasis in the left perihilar lung. Stable blunting of the lateral left costophrenic sulcus. IMPRESSION: Stable cardiomegaly and vascular congestion with small left effusion. Electronically Signed   By: Marnee SpringJonathon  Watts M.D.   On: 02/04/2019 05:13   Dg Chest Portable 1 View  Result Date: 01/30/2019 CLINICAL DATA:  Shortness of breath EXAM: PORTABLE CHEST 1 VIEW COMPARISON:  January 25, 2019 FINDINGS: There is cardiomegaly with mild volume overload. There is chronic atelectasis versus scarring in the lingula. There may be small bilateral pleural effusions. There is no pneumothorax. Advanced degenerative changes are noted of the right shoulder. IMPRESSION: Cardiomegaly with mild volume overload. Electronically Signed   By: Katherine Mantlehristopher  Green M.D.   On: 01/30/2019 01:37   Dg Chest Port 1 View  Result Date: 01/25/2019 CLINICAL DATA:  Shortness of breath. Chest pain. EXAM: PORTABLE CHEST 1 VIEW COMPARISON:  01/22/2019, 01/19/2019 and 01/16/2019 FINDINGS: Chronic cardiomegaly. Pulmonary vascularity is normal. No infiltrates. Slight haziness at the lung bases may  represent tiny effusions. No acute bone abnormality. IMPRESSION: Possible tiny effusions. Chronic cardiomegaly. Electronically Signed   By: Francene BoyersJames  Maxwell M.D.   On: 01/25/2019 11:48   Dg Chest Portable 1 View  Result Date: 01/22/2019 CLINICAL DATA:  Shortness of breath and chest pain for 1 year EXAM: PORTABLE CHEST 1 VIEW COMPARISON:  01/19/2019 FINDINGS: Cardiac shadow is enlarged but stable. Aortic calcifications are again seen. Mild vascular congestion is noted but improved when compared with the prior exam. Small left pleural effusion is noted as well as left midlung atelectatic changes. IMPRESSION: Small left pleural effusion and stable left midlung atelectasis. Vascular congestion but improved when compared with the prior study. Electronically Signed   By: Alcide CleverMark  Lukens M.D.   On: 01/22/2019 17:00   Dg Chest Portable 1 View  Result Date: 01/19/2019 CLINICAL DATA:  Chest pain and shortness of breath. EXAM: PORTABLE CHEST 1 VIEW COMPARISON:  Single-view of the chest 01/16/2019, 01/15/2019 and 11/17/2018. FINDINGS: There is cardiomegaly. Pulmonary edema is new since the most recent examination. Small bilateral pleural effusions are present, larger on the left. Atelectasis in the lingula and left lung base noted. No pneumothorax. Aortic atherosclerosis. No acute or focal bony abnormality. IMPRESSION: Cardiomegaly and pulmonary edema with associated small pleural effusions, greater on the left. Electronically Signed   By: Drusilla Kannerhomas  Dalessio M.D.   On: 01/19/2019 07:55      Discharge  Exam: Vitals:   02/13/19 1100 02/13/19 1226  BP: (!) 162/118 (!) 160/95  Pulse: (!) 103 92  Resp: (!) 22 20  Temp:  97.7 F (36.5 C)  SpO2: (!) 72% 90%        The results of significant diagnostics from this hospitalization (including imaging, microbiology, ancillary and laboratory) are listed below for reference.     Microbiology: Recent Results (from the past 240 hour(s))  SARS Coronavirus 2 [facility](Hospital  order, Performed in [facility] hospital lab) Nasopharyngeal Nasopharyngeal Swab     Status: None   Collection Time: 02/12/19  4:51 PM   Specimen: Nasopharyngeal Swab  Result Value Ref Range Status   SARS Coronavirus 2 NEGATIVE NEGATIVE Final    Comment: (NOTE) If result is NEGATIVE SARS-CoV-2 target nucleic acids are NOT DETECTED. The SARS-CoV-2 RNA is generally detectable in upper and lower  respiratory specimens during the acute phase of infection. The lowest  concentration of SARS-CoV-2 viral copies this assay can detect is 250  copies / mL. A negative result does not preclude SARS-CoV-2 infection  and should not be used as the sole basis for treatment or other  patient management decisions.  A negative result may occur with  improper specimen collection / handling, submission of specimen other  than nasopharyngeal swab, presence of viral mutation(s) within the  areas targeted by this assay, and inadequate number of viral copies  (<250 copies / mL). A negative result must be combined with clinical  observations, patient history, and epidemiological information. If result is POSITIVE SARS-CoV-2 target nucleic acids are DETECTED. The SARS-CoV-2 RNA is generally detectable in upper and lower  respiratory specimens dur ing the acute phase of infection.  Positive  results are indicative of active infection with SARS-CoV-2.  Clinical  correlation with patient history and other diagnostic information is  necessary to determine patient infection status.  Positive results do  not rule out bacterial infection or co-infection with other viruses. If result is PRESUMPTIVE POSTIVE SARS-CoV-2 nucleic acids MAY BE PRESENT.   A presumptive positive result was obtained on the submitted specimen  and confirmed on repeat testing.  While 2019 novel coronavirus  (SARS-CoV-2) nucleic acids may be present in the submitted sample  additional confirmatory testing may be necessary for epidemiological  and  / or clinical management purposes  to differentiate between  SARS-CoV-2 and other Sarbecovirus currently known to infect humans.  If clinically indicated additional testing with an alternate test  methodology [phone]) is advised. The SARS-CoV-2 RNA is generally  detectable in upper and lower respiratory sp ecimens during the acute  phase of infection. The expected result is Negative. Fact Sheet for Patients:  BoilerBrush.com.cyhttps://www.fda.gov/media/136312/download Fact Sheet for Healthcare Providers: https://pope.com/https://www.fda.gov/media/136313/download This test is not yet approved or cleared by the [place] FDA and has been authorized for detection and/or diagnosis of SARS-CoV-2 by FDA under an Emergency Use Authorization (EUA).  This EUA will remain in effect (meaning this test can be used) for the duration of the COVID-19 declaration under Section 564(b)(1) of the Act, 21 U.S.C. section 360bbb-3(b)(1), unless the authorization is terminated or revoked sooner. Performed at [facility], [address] [place], [place] [address]      Labs: BNP (last 3 results) Recent Labs    02/04/19 1412 02/05/19 0736 02/12/19 1549  BNP 958.7* 1,010.4* 924.1*   Basic Metabolic Panel: Recent Labs  Lab 02/12/19 1549 02/12/19 2130 02/13/19 0500  NA 138 141 138  K 2.8* 3.2* 3.6  CL  98 100 99  CO2 31 32 31  GLUCOSE 318* 254* 239*  BUN 13 13 14   CREATININE 0.83 0.87 0.82  CALCIUM 8.3* 8.5* 8.6*  MG 1.7  --   --    Liver Function Tests: Recent Labs  Lab 02/12/19 1549  AST 21  ALT 12  ALKPHOS 177*  BILITOT 0.6  PROT 7.0  ALBUMIN 2.7*   No results for input(s): LIPASE, AMYLASE in the last 168 hours. No results for input(s): AMMONIA in the last 168 hours. CBC: Recent Labs  Lab 02/12/19 1549 02/13/19 0500  WBC 8.2 6.3  NEUTROABS 6.5  --   HGB 8.4* 8.3*  HCT 30.2* 30.2*  MCV 76.6* 77.6*  PLT 334 342   Cardiac Enzymes: No results for input(s): CKTOTAL, CKMB, CKMBINDEX,  TROPONINI in the last 168 hours. BNP: Invalid input(s): POCBNP CBG: Recent Labs  Lab 02/12/19 2216 02/13/19 0801 02/13/19 1131  GLUCAP 242* 204* 334*   D-Dimer No results for input(s): DDIMER in the last 72 hours. Hgb A1c No results for input(s): HGBA1C in the last 72 hours. Lipid Profile No results for input(s): CHOL, HDL, LDLCALC, TRIG, CHOLHDL, LDLDIRECT in the last 72 hours. Thyroid function studies No results for input(s): TSH, T4TOTAL, T3FREE, THYROIDAB in the last 72 hours.  Invalid input(s): FREET3 Anemia work up No results for input(s): VITAMINB12, FOLATE, FERRITIN, TIBC, IRON, RETICCTPCT in the last 72 hours. Urinalysis    Component Value Date/Time   COLORURINE YELLOW 02/13/2019 0408   APPEARANCEUR CLEAR 02/13/2019 0408   LABSPEC 1.019 02/13/2019 0408   PHURINE 5.0 02/13/2019 0408   GLUCOSEU 50 (A) 02/13/2019 0408   HGBUR NEGATIVE 02/13/2019 0408   BILIRUBINUR NEGATIVE 02/13/2019 0408   KETONESUR NEGATIVE 02/13/2019 0408   PROTEINUR 100 (A) 02/13/2019 0408   UROBILINOGEN 1.0 03/14/2015 0610   NITRITE NEGATIVE 02/13/2019 0408   LEUKOCYTESUR NEGATIVE 02/13/2019 0408   Sepsis Labs Invalid input(s): PROCALCITONIN,  WBC,  LACTICIDVEN Microbiology Recent Results (from the past 240 hour(s))  SARS Coronavirus 2 [facility](Hospital order, Performed in [facility] hospital lab) Nasopharyngeal Nasopharyngeal Swab     Status: None   Collection Time: 02/12/19  4:51 PM   Specimen: Nasopharyngeal Swab  Result Value Ref Range Status   SARS Coronavirus 2 NEGATIVE NEGATIVE Final    Comment: (NOTE) If result is NEGATIVE SARS-CoV-2 target nucleic acids are NOT DETECTED. The SARS-CoV-2 RNA is generally detectable in upper and lower  respiratory specimens during the acute phase of infection. The lowest  concentration of SARS-CoV-2 viral copies this assay can detect is 250  copies / mL. A negative result does not preclude SARS-CoV-2 infection  and should not be used as the sole basis  for treatment or other  patient management decisions.  A negative result may occur with  improper specimen collection / handling, submission of specimen other  than nasopharyngeal swab, presence of viral mutation(s) within the  areas targeted by this assay, and inadequate number of viral copies  (<250 copies / mL). A negative result must be combined with clinical  observations, patient history, and epidemiological information. If result is POSITIVE SARS-CoV-2 target nucleic acids are DETECTED. The SARS-CoV-2 RNA is generally detectable in upper and lower  respiratory specimens dur ing the acute phase of infection.  Positive  results are indicative of active infection with SARS-CoV-2.  Clinical  correlation with patient history and other diagnostic information is  necessary to determine patient infection status.  Positive results do  not rule out bacterial infection or  co-infection with other viruses. If result is PRESUMPTIVE POSTIVE SARS-CoV-2 nucleic acids MAY BE PRESENT.   A presumptive positive result was obtained on the submitted specimen  and confirmed on repeat testing.  While 2019 novel coronavirus  (SARS-CoV-2) nucleic acids may be present in the submitted sample  additional confirmatory testing may be necessary for epidemiological  and / or clinical management purposes  to differentiate between  SARS-CoV-2 and other Sarbecovirus currently known to infect humans.  If clinically indicated additional testing with an alternate test  methodology [phone]) is advised. The SARS-CoV-2 RNA is generally  detectable in upper and lower respiratory sp ecimens during the acute  phase of infection. The expected result is Negative. Fact Sheet for Patients:  BoilerBrush.com.cy Fact Sheet for Healthcare Providers: https://pope.com/ This test is not yet approved or cleared by the [place] FDA and has been authorized for detection and/or  diagnosis of SARS-CoV-2 by FDA under an Emergency Use Authorization (EUA).  This EUA will remain in effect (meaning this test can be used) for the duration of the COVID-19 declaration under Section 564(b)(1) of the Act, 21 U.S.C. section 360bbb-3(b)(1), unless the authorization is terminated or revoked sooner. Performed at [facility], [address], [place], [place] [address]      Time coordinating discharge:22 minutes  SIGNED:   Kathlen Mody, MD  [facility] 02/15/2019, 7:55 PM Pager   If 7PM-7AM, please contact night-coverage [email or web address] Password [email or web address]

## 2019-02-16 ENCOUNTER — Encounter (HOSPITAL_COMMUNITY): Payer: Self-pay

## 2019-02-16 ENCOUNTER — Other Ambulatory Visit: Payer: Self-pay

## 2019-02-16 ENCOUNTER — Emergency Department (HOSPITAL_COMMUNITY)
Admission: EM | Admit: 2019-02-16 | Discharge: 2019-02-17 | Disposition: A | Discharge status: Home / Self Care | Payer: Medicaid Other | Attending: Emergency Medicine | Admitting: Emergency Medicine

## 2019-02-16 DIAGNOSIS — F17210 Nicotine dependence, cigarettes, uncomplicated: Secondary | ICD-10-CM | POA: Insufficient documentation

## 2019-02-16 DIAGNOSIS — R600 Localized edema: Secondary | ICD-10-CM | POA: Insufficient documentation

## 2019-02-16 DIAGNOSIS — M79662 Pain in left lower leg: Secondary | ICD-10-CM | POA: Diagnosis not present

## 2019-02-16 DIAGNOSIS — G8929 Other chronic pain: Secondary | ICD-10-CM | POA: Diagnosis not present

## 2019-02-16 DIAGNOSIS — F1290 Cannabis use, unspecified, uncomplicated: Secondary | ICD-10-CM | POA: Insufficient documentation

## 2019-02-16 DIAGNOSIS — Z590 Homelessness: Secondary | ICD-10-CM | POA: Insufficient documentation

## 2019-02-16 DIAGNOSIS — F1490 Cocaine use, unspecified, uncomplicated: Secondary | ICD-10-CM | POA: Diagnosis not present

## 2019-02-16 DIAGNOSIS — I5042 Chronic combined systolic (congestive) and diastolic (congestive) heart failure: Secondary | ICD-10-CM | POA: Insufficient documentation

## 2019-02-16 DIAGNOSIS — M79661 Pain in right lower leg: Secondary | ICD-10-CM | POA: Diagnosis not present

## 2019-02-16 DIAGNOSIS — Z79899 Other long term (current) drug therapy: Secondary | ICD-10-CM | POA: Insufficient documentation

## 2019-02-16 DIAGNOSIS — I10 Essential (primary) hypertension: Secondary | ICD-10-CM | POA: Insufficient documentation

## 2019-02-16 DIAGNOSIS — F209 Schizophrenia, unspecified: Secondary | ICD-10-CM | POA: Diagnosis not present

## 2019-02-16 DIAGNOSIS — M7989 Other specified soft tissue disorders: Secondary | ICD-10-CM

## 2019-02-16 DIAGNOSIS — M79605 Pain in left leg: Secondary | ICD-10-CM

## 2019-02-16 NOTE — ED Triage Notes (Addendum)
Per ems: Pt coming from home c/o bilateral leg swelling after "smoking crack" an hour ago.   200/104 104 hr 22 rr 92 RA 336 CBG

## 2019-02-17 ENCOUNTER — Emergency Department (HOSPITAL_COMMUNITY)
Admission: EM | Admit: 2019-02-17 | Discharge: 2019-02-17 | Disposition: A | Discharge status: Home / Self Care | Payer: Medicaid Other | Source: Home / Self Care | Attending: Emergency Medicine | Admitting: Emergency Medicine

## 2019-02-17 DIAGNOSIS — Z590 Homelessness: Secondary | ICD-10-CM | POA: Insufficient documentation

## 2019-02-17 DIAGNOSIS — N5089 Other specified disorders of the male genital organs: Secondary | ICD-10-CM

## 2019-02-17 DIAGNOSIS — E119 Type 2 diabetes mellitus without complications: Secondary | ICD-10-CM | POA: Insufficient documentation

## 2019-02-17 DIAGNOSIS — R2243 Localized swelling, mass and lump, lower limb, bilateral: Secondary | ICD-10-CM | POA: Insufficient documentation

## 2019-02-17 DIAGNOSIS — F17210 Nicotine dependence, cigarettes, uncomplicated: Secondary | ICD-10-CM | POA: Insufficient documentation

## 2019-02-17 DIAGNOSIS — I110 Hypertensive heart disease with heart failure: Secondary | ICD-10-CM | POA: Insufficient documentation

## 2019-02-17 DIAGNOSIS — R600 Localized edema: Secondary | ICD-10-CM

## 2019-02-17 DIAGNOSIS — I5043 Acute on chronic combined systolic (congestive) and diastolic (congestive) heart failure: Secondary | ICD-10-CM | POA: Insufficient documentation

## 2019-02-17 MED ORDER — ACETAMINOPHEN 325 MG PO TABS
650.0000 mg | ORAL_TABLET | Freq: Once | ORAL | Status: AC
  Administered 2019-02-17: 650 mg via ORAL
  Filled 2019-02-17: qty 2

## 2019-02-17 MED ORDER — FUROSEMIDE 40 MG PO TABS: 40.0000 mg | ORAL_TABLET | Freq: Two times a day (BID) | ORAL | 0 refills | Status: DC

## 2019-02-17 MED ORDER — CEPHALEXIN 500 MG PO CAPS: 500.0000 mg | ORAL_CAPSULE | Freq: Four times a day (QID) | ORAL | 0 refills | Status: DC

## 2019-02-17 MED ORDER — FUROSEMIDE 40 MG PO TABS
60.0000 mg | ORAL_TABLET | Freq: Once | ORAL | Status: AC
  Administered 2019-02-17: 60 mg via ORAL
  Filled 2019-02-17: qty 2

## 2019-02-17 MED ORDER — BACITRACIN ZINC 500 UNIT/GM EX OINT
TOPICAL_OINTMENT | Freq: Once | CUTANEOUS | Status: AC
  Administered 2019-02-17: 1 via TOPICAL
  Filled 2019-02-17: qty 0.9

## 2019-02-17 MED ORDER — ACETAMINOPHEN 500 MG PO TABS
1000.0000 mg | ORAL_TABLET | Freq: Once | ORAL | Status: AC
  Administered 2019-02-17: 1000 mg via ORAL
  Filled 2019-02-17: qty 2

## 2019-02-17 NOTE — ED Provider Notes (Signed)
[facility]-EMERGENCY DEPT Provider Note   CSN: [id number] Arrival date & time: 02/16/19  2326    History   Chief Complaint Chief Complaint  Patient presents with   Leg Pain    HPI [name] is a 58 y.o. male with history of polysubstance abuse, homelessness, schizophrenia, CHF, diabetes, chronic foot pain, hypertension presents today for leg swelling and a place to sleep.  Upon my initial evaluation patient is requesting Lasix and to sleep in the triage room until tomorrow morning.  He reports chronic bilateral leg pain, he reports that this is his normal amount of swelling today and is not worsened.  He describes a moderate intensity bilateral leg aching sensation constant worsened with ambulation and palpation and without alleviating factors.  He reports that this is his chronic bilateral leg pain and has not changed from prior.  Patient reports that he is homeless and out of his Lasix medication and would like a dose of his Lasix today.  Patient is also requesting a Tylenol today for his leg pain.  Patient second concern is a place to sleep, he is requesting to stay in this emergency department until tomorrow morning.  He denies any fever/chills, chest pain/shortness of breath, unilateral swelling, nausea/vomiting, abdominal pain or any additional concerns.  Of note patient reported crack use to triage staff.    HPI  Past Medical History:  Diagnosis Date   Chronic foot pain    Cocaine abuse (HCC)    Diabetes mellitus without complication (HCC)    Hepatitis C    unsure    Homelessness    Hypertension    Neuropathy    Polysubstance abuse (HCC)    Schizophrenia (HCC)    Sleep apnea    Systolic and diastolic CHF, chronic (HCC)     Patient Active Problem List   Diagnosis Date Noted   CHF (congestive heart failure), NYHA class III, acute on chronic, combined (HCC) 02/02/2019   Scrotal edema 01/30/2019   Combined systolic and  diastolic heart failure (HCC) 01/19/2019   Chest pain 01/16/2019   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   Medically noncompliant    Acute on chronic systolic CHF (congestive heart failure) (HCC) 01/02/2019   Acute on chronic combined systolic (congestive) and diastolic (congestive) heart failure (HCC) 01/02/2019   Acute on chronic systolic heart failure (HCC) 12/26/2018   Mobitz type II atrioventricular block 12/25/2018   Acute exacerbation of CHF (congestive heart failure) (HCC) 12/22/2018   Acute on chronic respiratory failure with hypoxia (HCC) 12/22/2018   CHF exacerbation (HCC) 12/19/2018   Acute CHF (congestive heart failure) (HCC) 12/16/2018   Diabetes mellitus without complication (HCC) 12/11/2018   Acute on chronic systolic (congestive) heart failure (HCC) 12/11/2018   Abdominal pain 12/11/2018   Nausea vomiting and diarrhea 12/11/2018   Microcytic anemia 12/11/2018   Evaluation by psychiatric service required    MDD (major depressive disorder), severe (HCC) 11/25/2018   Pressure injury of skin 11/08/2018   Elevated troponin 10/18/2018   HLD (hyperlipidemia) 10/18/2018   Anxiety 10/18/2018   CHF (congestive heart failure), NYHA class II, acute on chronic, combined (HCC) 10/18/2018   Hypertensive urgency 10/12/2018   Mild renal insufficiency 10/12/2018   Cellulitis 10/12/2018   Penile cellulitis    Adjustment disorder with mixed disturbance of emotions and conduct    Sleep apnea 10/03/2018   Hypokalemia 09/17/2018   Scrotal swelling 09/17/2018   Urinary hesitancy    Constipation    Acute on chronic combined systolic  and diastolic CHF (congestive heart failure) (HCC) 08/25/2018   Acute respiratory failure with hypoxia (HCC) 08/25/2018   Tobacco use 08/18/2018   Homelessness 08/08/2018   Smoker 08/08/2018   Prostate enlargement 03/16/2018   Aortic atherosclerosis (HCC) 03/16/2018   Aneurysm of abdominal aorta (HCC) 03/16/2018   Cocaine abuse  with cocaine-induced mood disorder (HCC) 09/18/2017   Chronic foot pain    Schizoaffective disorder, bipolar type (HCC) 09/30/2016   Substance induced mood disorder (HCC) 03/13/2015   Schizophrenia, paranoid type (HCC) 01/17/2015   Suicidal ideation    Drug hallucinosis (HCC) 10/08/2014   Chronic paranoid schizophrenia (HCC) 09/07/2014   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/10/2014   Urinary retention    Cocaine use disorder, severe, dependence (HCC)    Essential hypertension 03/28/2013   Insulin-requiring or dependent type II diabetes mellitus (HCC) 03/15/2013    Past Surgical History:  Procedure Laterality Date   MULTIPLE TOOTH EXTRACTIONS          Home Medications    Prior to Admission medications   Medication Sig Start Date End Date Taking? Authorizing Provider  acetaminophen (TYLENOL) 500 MG tablet Take 1 tablet (500 mg total) by mouth every 6 (six) hours as needed. Patient not taking: Reported on 01/19/2019 01/08/19   Fayrene Helperran, Bowie, PA-C  ARIPiprazole (ABILIFY) 10 MG tablet Take 1 tablet (10 mg total) by mouth daily. Patient not taking: Reported on 01/13/2019 12/10/18   Joycelyn DasPokhrel, Laxman, MD  carbamazepine (TEGRETOL) 200 MG tablet Take 0.5 tablets (100 mg total) by mouth 2 (two) times daily. Patient not taking: Reported on 01/15/2019 11/26/18   Mancel BaleWentz, Elliott, MD  furosemide (LASIX) 40 MG tablet Take 1 tablet (40 mg total) by mouth 2 (two) times daily. 02/17/19 03/19/19  Harlene SaltsMorelli, Zaiah Eckerson A, PA-C  glipiZIDE (GLUCOTROL) 5 MG tablet Take 0.5 tablets (2.5 mg total) by mouth 2 (two) times daily before a meal. Patient not taking: Reported on 02/14/2019 01/01/19   Rai, Delene Ruffiniipudeep K, MD  Ipratropium-Albuterol (COMBIVENT) 20-100 MCG/ACT AERS respimat Inhale 1 puff into the lungs every 6 (six) hours. Patient not taking: Reported on 01/15/2019 11/30/18   Palumbo, April, MD  lisinopril (ZESTRIL) 10 MG tablet Take 1 tablet (10 mg  total) by mouth daily. Patient not taking: Reported on 01/15/2019 11/26/18   Mancel BaleWentz, Elliott, MD  potassium chloride SA (K-DUR) 20 MEQ tablet Take 1 tablet (20 mEq total) by mouth 2 (two) times daily. Patient not taking: Reported on 01/15/2019 11/30/18   Nicanor AlconPalumbo, April, MD    Family History Family History  Problem Relation Age of Onset   Hypertension Other    Diabetes Other     Social History Social History   Tobacco Use   Smoking status: Current Every Day Smoker    Packs/day: 1.00    Years: 20.00    Pack years: 20.00    Types: Cigarettes   Smokeless tobacco: Current User  Substance Use Topics   Alcohol use: Yes    Comment: Daily Drinker    Drug use: Yes    Frequency: 7.0 times per week    Types: "Crack" cocaine, Cocaine, Marijuana    Comment: Crack     Allergies   Haldol [haloperidol]   Review of Systems Review of Systems Ten systems are reviewed and are negative for acute change except as noted in the HPI  Physical Exam Updated Vital Signs BP (!) 167/107 (BP Location: Left Arm)    Pulse (!) 103    Temp  98.1 F (36.7 C) (Oral)    Resp 17    SpO2 94%   Physical Exam Constitutional:      General: He is not in acute distress.    Appearance: Normal appearance. He is well-developed. He is not ill-appearing or diaphoretic.  HENT:     Head: Normocephalic and atraumatic.     Right Ear: External ear normal.     Left Ear: External ear normal.     Nose: Nose normal.  Eyes:     General: Vision grossly intact. Gaze aligned appropriately.     Pupils: Pupils are equal, round, and reactive to light.  Neck:     Musculoskeletal: Normal range of motion.     Trachea: Trachea and phonation normal. No tracheal deviation.  Cardiovascular:     Rate and Rhythm: Normal rate and regular rhythm.     Pulses:          Dorsalis pedis pulses are 1+ on the right side and 1+ on the left side.  Pulmonary:     Effort: Pulmonary effort is normal. No accessory muscle usage or respiratory  distress.  Abdominal:     General: There is no distension.     Palpations: Abdomen is soft.     Tenderness: There is no abdominal tenderness. There is no guarding or rebound.  Musculoskeletal: Normal range of motion.     Right lower leg: 2+ Edema present.     Left lower leg: 2+ Edema present.  Skin:    General: Skin is warm and dry.  Neurological:     Mental Status: He is alert.     GCS: GCS eye subscore is 4. GCS verbal subscore is 5. GCS motor subscore is 6.     Comments: Speech is clear and goal oriented, follows commands Major Cranial nerves without deficit, no facial droop Moves extremities without ataxia, coordination intact  Psychiatric:        Behavior: Behavior normal.    ED Treatments / Results  Labs (all labs ordered are listed, but only abnormal results are displayed) Labs Reviewed - No data to display  EKG None  Radiology No results found.  Procedures Procedures (including critical care time)  Medications Ordered in ED Medications  furosemide (LASIX) tablet 60 mg (60 mg Oral Given 02/17/19 0439)  acetaminophen (TYLENOL) tablet 650 mg (650 mg Oral Given 02/17/19 0439)     Initial Impression / Assessment and Plan / ED Course  I have reviewed the triage vital signs and the nursing notes.  Pertinent labs & imaging results that were available during my care of the patient were reviewed by me and considered in my medical decision making (see chart for details).    Patient arrives with what appears to be his chronic complaints of bilateral leg pain and swelling.  He is requesting Lasix today, he denies any chest pain or shortness of breath.  Reports being out of his home medications as he is homeless.  Also with recent drug use.  Patient vital signs appear baseline, SPO2 94% on room air consistent with prior, hypertensive and mildly tachycardic.  Patient does not appear to be in any respiratory distress.  He is overall well-appearing and speaking in full sentences.   He is requesting food and a place to sleep tonight along with Lasix and Tylenol.  Patient briefly left the emergency department to smoke a cigarette before returning, returns in no acute distress. Will provide patient with Lasix and Tylenol, will give patient resources to  local shelters.  Patient has been admitted recently for CHF but does not appear to be in acute exacerbation today, no indication for further work-up at this time.  Patient informed of hypertension and need to take all medications as prescribed, encouraged to follow-up with PCP this week for blood pressure recheck and medication management.  Patient also advised to stop using drugs as this is likely exacerbating his symptoms. Case discussed with Dr. Eudelia Bunchardama who agrees with Lasix/Tylenol and discharge.  At this time there does not appear to be any evidence of an acute emergency medical condition and the patient appears stable for discharge with appropriate outpatient follow up. Diagnosis was discussed with patient who verbalizes understanding of care plan and is agreeable to discharge. I have discussed return precautions with patient who verbalizes understanding of return precautions. Patient encouraged to follow-up with their PCP. All questions answered.   Note: Portions of this report may have been transcribed using voice recognition software. Every effort was made to ensure accuracy; however, inadvertent computerized transcription errors may still be present. Final Clinical Impressions(s) / ED Diagnoses   Final diagnoses:  Leg swelling  Chronic pain of both lower extremities    ED Discharge Orders         Ordered    furosemide (LASIX) 40 MG tablet  2 times daily     02/17/19 0454           Bill SalinasMorelli, Tahesha Skeet A, PA-C 02/17/19 0504    Nira Connardama, Pedro Eduardo, MD 02/19/19 [phone]

## 2019-02-17 NOTE — ED Triage Notes (Signed)
Pt here via EMS for swollen and painful feet d/t neuropathy and cut on his penis. Pt would like to see social work, mental health help (denies SI/HI) and would like some Tylenol.

## 2019-02-17 NOTE — ED Provider Notes (Signed)
[facility] EMERGENCY [facility]PART[facility] Provider Note   CSN: [id number] Arrival date & time: 02/17/19  1232     History   Chief Complaint Chief Complaint  Patient presents with  . Leg Swelling  . Groin Swelling    HPI [name] is a 58 y.o. male.     Patient is a 58 year old male well-known to the emergency department for frequent visits.  [facility] with similar complaints.  Patient reporting shortness of breath and swelling of his scrotum and legs.  He has been habitually noncompliant with medications and medical treatment.  This has been an ongoing problem for him for several years.  The history is provided by the patient.    Past Medical History:  Diagnosis Date  . Chronic foot pain   . Cocaine abuse (HCC)   . Diabetes mellitus without complication (HCC)   . Hepatitis C    unsure   . Homelessness   . Hypertension   . Neuropathy   . Polysubstance abuse (HCC)   . Schizophrenia (HCC)   . Sleep apnea   . Systolic and diastolic CHF, chronic [facility](HCC)     Patient Active Problem List   Diagnosis Date Noted  . CHF (congestive heart failure), NYHA class III, acute on chronic, combined (HCC) 02/02/2019  . Scrotal edema 01/30/2019  . Combined systolic and diastolic heart failure (HCC) 01/19/2019  . Chest pain 01/16/2019  . 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  . Medically noncompliant   . Acute on chronic systolic CHF (congestive heart failure) (HCC) 01/02/2019  . 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 Acute on chronic systolic heart failure (HCC) 12/26/2018  . Mobitz type II atrioventricular block 12/25/2018  . Acute exacerbation of CHF (congestive heart failure) (HCC) 12/22/2018  . Acute on chronic respiratory failure with hypoxia (HCC) 12/22/2018  . CHF exacerbation (HCC) 12/19/2018  . Acute CHF (congestive heart failure) (HCC) 12/16/2018  . Diabetes  mellitus without complication (HCC) 12/11/2018  . Acute on chronic systolic (congestive) heart failure (HCC) 12/11/2018  . Abdominal pain 12/11/2018  . Nausea vomiting and diarrhea 12/11/2018  . Microcytic anemia 12/11/2018  . Evaluation by psychiatric service required   . MDD (major depressive disorder), severe (HCC) 11/25/2018  . Pressure injury of skin 11/08/2018  . Elevated troponin 10/18/2018  . HLD (hyperlipidemia) 10/18/2018  . Anxiety 10/18/2018  . CHF (congestive heart failure), NYHA class II, acute on chronic, combined (HCC) 10/18/2018  . Hypertensive urgency 10/12/2018  . Mild renal insufficiency 10/12/2018  . Cellulitis 10/12/2018  . Penile cellulitis   . Adjustment disorder with mixed disturbance of emotions and conduct   . Sleep apnea 10/03/2018  . Hypokalemia 09/17/2018  . Scrotal swelling 09/17/2018  . Urinary hesitancy   . Constipation   . Acute on chronic combined systolic and diastolic CHF (congestive heart failure) (HCC) 08/25/2018  . Acute respiratory failure with hypoxia (HCC) 08/25/2018  . Tobacco use 08/18/2018  . Homelessness 08/08/2018  . Smoker 08/08/2018  . Prostate enlargement 03/16/2018  . Aortic atherosclerosis (HCC) 03/16/2018  . 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 Cocaine abuse with cocaine-induced mood disorder (HCC) 09/18/2017  . Chronic foot pain   . Schizoaffective disorder, bipolar type (HCC) 09/30/2016  . Substance induced mood disorder (HCC) 03/13/2015  . Schizophrenia, paranoid type (HCC) 01/17/2015  . Suicidal ideation   . Drug hallucinosis (HCC) 10/08/2014  . Chronic paranoid schizophrenia (HCC)  09/07/2014  . Substance or medication-induced bipolar and related disorder with onset during intoxication ([place]) 08/10/2014  . Urinary retention   . Cocaine use disorder, severe, dependence ([place])   . Essential hypertension 03/28/2013  . Insulin-requiring or dependent type II diabetes mellitus ([place]) 03/15/2013    Past Surgical  History:  Procedure Laterality Date  . MULTIPLE TOOTH EXTRACTIONS          Home Medications    Prior to Admission medications   Medication Sig Start Date End Date Taking? Authorizing Provider  acetaminophen (TYLENOL) 500 MG tablet Take 1 tablet (500 mg total) by mouth every 6 (six) hours as needed. Patient not taking: Reported on 01/19/2019 01/08/19   Domenic Moras, PA-C  ARIPiprazole (ABILIFY) 10 MG tablet Take 1 tablet (10 mg total) by mouth daily. Patient not taking: Reported on 01/13/2019 12/10/18   Flora Lipps, MD  carbamazepine (TEGRETOL) 200 MG tablet Take 0.5 tablets (100 mg total) by mouth 2 (two) times daily. Patient not taking: Reported on 01/15/2019 11/26/18   Daleen Bo, MD  furosemide (LASIX) 40 MG tablet Take 1 tablet (40 mg total) by mouth 2 (two) times daily. 02/17/19 03/19/19  Nuala Alpha A, PA-C  glipiZIDE (GLUCOTROL) 5 MG tablet Take 0.5 tablets (2.5 mg total) by mouth 2 (two) times daily before a meal. Patient not taking: Reported on 02/14/2019 01/01/19   Rai, Vernelle Emerald, MD  Ipratropium-Albuterol (COMBIVENT) 20-100 MCG/ACT AERS respimat Inhale 1 puff into the lungs every 6 (six) hours. Patient not taking: Reported on 01/15/2019 11/30/18   Palumbo, April, MD  lisinopril (ZESTRIL) 10 MG tablet Take 1 tablet (10 mg total) by mouth daily. Patient not taking: Reported on 01/15/2019 11/26/18   Daleen Bo, MD  potassium chloride SA (K-DUR) 20 MEQ tablet Take 1 tablet (20 mEq total) by mouth 2 (two) times daily. Patient not taking: Reported on 01/15/2019 11/30/18   Randal Buba, April, MD    Family History Family History  Problem Relation Age of Onset  . Hypertension Other   . Diabetes Other     Social History Social History   Tobacco Use  . Smoking status: Current Every Day Smoker    Packs/day: 1.00    Years: 20.00    Pack years: 20.00    Types: Cigarettes  . Smokeless tobacco: Current User  Substance Use Topics  . Alcohol use: Yes    Comment: Daily Drinker   .  Drug use: Yes    Frequency: 7.0 times per week    Types: "Crack" cocaine, Cocaine, Marijuana    Comment: Crack     Allergies   Haldol [haloperidol]   Review of Systems Review of Systems  All other systems reviewed and are negative.    Physical Exam Updated Vital Signs BP (!) 166/96 (BP Location: Right Arm)   Pulse 99   Temp 98.8 F (37.1 C) (Oral)   Resp 14   Ht 5\' 9"  (1.753 m)   SpO2 91% Comment: 91 on RA, Pt placed on 2L O2 Nasal Cannula   BMI 34.93 kg/m   Physical Exam Vitals signs and nursing note reviewed.  Constitutional:      General: He is not in acute distress.    Appearance: He is well-developed. He is not diaphoretic.  HENT:     Head: Normocephalic and atraumatic.  Neck:     Musculoskeletal: Normal range of motion and neck supple.  Cardiovascular:     Rate and Rhythm: Normal rate and regular rhythm.     Heart  sounds: No murmur. No friction rub.  Pulmonary:     Effort: Pulmonary effort is normal. No respiratory distress.     Breath sounds: Rales present. No wheezing.  Abdominal:     General: Bowel sounds are normal. There is no distension.     Palpations: Abdomen is soft.     Tenderness: There is no abdominal tenderness.  Genitourinary:    Comments: Patient's testicles are edematous, however this is a chronic issue and has been this way for many, many months.  He has a small area to the base of the penis that appears to have mild, superficial skin breakdown. Musculoskeletal: Normal range of motion.     Right lower leg: Edema present.     Left lower leg: Edema present.     Comments: There is trace edema of both lower extremities.  Skin:    General: Skin is warm and dry.  Neurological:     Mental Status: He is alert and oriented to person, place, and time.     Coordination: Coordination normal.      ED Treatments / Results  Labs (all labs ordered are listed, but only abnormal results are displayed) Labs Reviewed - No data to display  EKG  None  Radiology No results found.  Procedures Procedures (including critical care time)  Medications Ordered in ED Medications  acetaminophen (TYLENOL) tablet 1,000 mg (has no administration in time range)  bacitracin ointment (has no administration in time range)     Initial Impression / Assessment and Plan / ED Course  I have reviewed the triage vital signs and the nursing notes.  Pertinent labs & imaging results that were available during my care of the patient were reviewed by me and considered in my medical decision making (see chart for details).  Patient well-known to the emergency department.  He has a history of chronic swelling of the legs and testicles.  He is homeless and noncompliant with medications and medical care.  He has also been given homeless resources, however continues to use the emergency department for food and shelter.  [facility] and discharged early this a.m.  He has had laboratory studies within the past several days that are all essentially baseline for him.  I see no indication to repeat any of these studies or perform any additional tests.  Patient oxygen saturations in the mid 90s while I am in the room with him.  He is speaking aggressively with no dyspnea or hypoxia.  Patient will be discharged.  He is advised to fill the prescription for Lasix he was provided this morning.  He will also be given an antibiotic for the area he is concerned about on his genitalia.  Final Clinical Impressions(s) / ED Diagnoses   Final diagnoses:  None    ED Discharge Orders    None       Geoffery Lyonselo, Milaya Hora, MD 02/17/19 1652

## 2019-02-17 NOTE — Discharge Instructions (Signed)
You have been diagnosed today with bilateral leg swelling and pain.  At this time there does not appear to be the presence of an emergent medical condition, however there is always the potential for conditions to change. Please read and follow the below instructions.  Please return to the Emergency Department immediately for any new or worsening symptoms. Please be sure to follow up with your Primary Care Provider within one week regarding your visit today; please call their office to schedule an appointment even if you are feeling better for a follow-up visit. Please take your home medications as prescribed.  Your blood pressure was elevated today.  Please call your primary care provider to schedule a follow-up blood pressure check and medication management this week.  Please take your home blood pressure medications as prescribed by your primary care provider.  Get help right away if: You have trouble breathing. You or someone else notices a change in your behavior, such as having trouble staying awake. You have chest pain or discomfort. You pass out (faint). You have any new/concerning or worsening symptoms  Please read the additional information packets attached to your discharge summary.  Do not take your medicine if  develop an itchy rash, swelling in your mouth or lips, or difficulty breathing; call 911 and seek immediate emergency medical attention if this occurs.

## 2019-02-17 NOTE — ED Notes (Signed)
Attempted to obtain oral temp on pt. While trying to get reading pt kept opening his mouth and would not keep probe under his tongue while trying to go back to sleep.

## 2019-02-17 NOTE — ED Notes (Signed)
Patient reported that he was leaving to smoke cigarette

## 2019-02-17 NOTE — Discharge Instructions (Addendum)
Fill your prescription for Lasix you were given this morning.  Begin taking Keflex as prescribed this afternoon.  Apply bacitracin or Neosporin to the sore area twice daily.  Follow-up with primary doctor for any additional problems.

## 2019-02-18 ENCOUNTER — Emergency Department (HOSPITAL_COMMUNITY)
Admission: EM | Admit: 2019-02-18 | Discharge: 2019-02-18 | Disposition: A | Discharge status: Home / Self Care | Payer: Medicaid Other | Attending: Emergency Medicine | Admitting: Emergency Medicine

## 2019-02-18 ENCOUNTER — Emergency Department (HOSPITAL_COMMUNITY)
Admission: EM | Admit: 2019-02-18 | Discharge: 2019-02-19 | Disposition: A | Discharge status: Home / Self Care | Payer: Medicaid Other | Attending: Emergency Medicine | Admitting: Emergency Medicine

## 2019-02-18 ENCOUNTER — Encounter (HOSPITAL_COMMUNITY): Payer: Self-pay

## 2019-02-18 ENCOUNTER — Other Ambulatory Visit: Payer: Self-pay

## 2019-02-18 DIAGNOSIS — E119 Type 2 diabetes mellitus without complications: Secondary | ICD-10-CM | POA: Diagnosis not present

## 2019-02-18 DIAGNOSIS — Z5900 Homelessness unspecified: Secondary | ICD-10-CM

## 2019-02-18 DIAGNOSIS — F17210 Nicotine dependence, cigarettes, uncomplicated: Secondary | ICD-10-CM | POA: Insufficient documentation

## 2019-02-18 DIAGNOSIS — F259 Schizoaffective disorder, unspecified: Secondary | ICD-10-CM | POA: Diagnosis not present

## 2019-02-18 DIAGNOSIS — I110 Hypertensive heart disease with heart failure: Secondary | ICD-10-CM | POA: Insufficient documentation

## 2019-02-18 DIAGNOSIS — Z5321 Procedure and treatment not carried out due to patient leaving prior to being seen by health care provider: Secondary | ICD-10-CM | POA: Insufficient documentation

## 2019-02-18 DIAGNOSIS — R0789 Other chest pain: Secondary | ICD-10-CM | POA: Diagnosis not present

## 2019-02-18 DIAGNOSIS — E669 Obesity, unspecified: Secondary | ICD-10-CM | POA: Diagnosis not present

## 2019-02-18 DIAGNOSIS — I5042 Chronic combined systolic (congestive) and diastolic (congestive) heart failure: Secondary | ICD-10-CM | POA: Diagnosis not present

## 2019-02-18 DIAGNOSIS — Z6835 Body mass index (BMI) 35.0-35.9, adult: Secondary | ICD-10-CM | POA: Insufficient documentation

## 2019-02-18 DIAGNOSIS — R600 Localized edema: Secondary | ICD-10-CM | POA: Diagnosis not present

## 2019-02-18 IMAGING — DX DG CHEST 2V
2 series · 2 of 2 positions shown · non-contrast
Comparison: Prior radiograph from 08/03/2018

CLINICAL DATA: Initial evaluation for acute shortness of breath

EXAM:
CHEST - 2 VIEW

[chest pa]
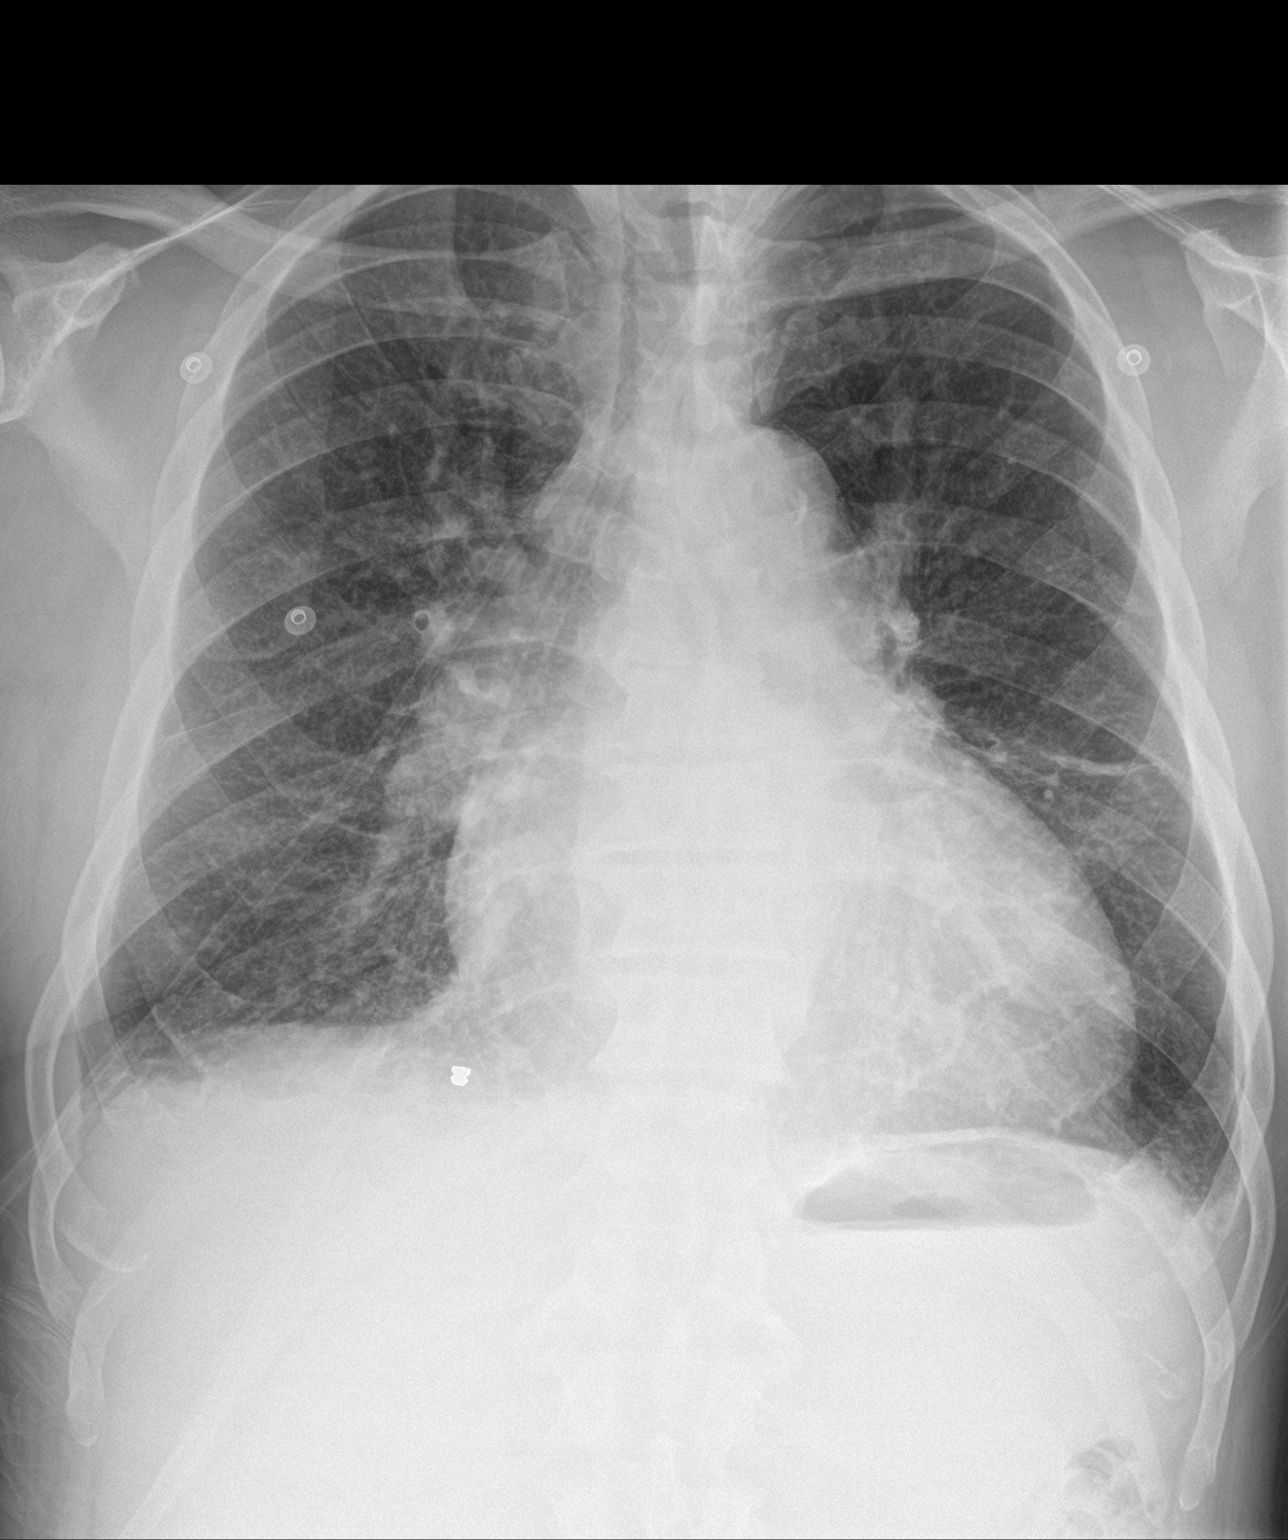

[chest lat]
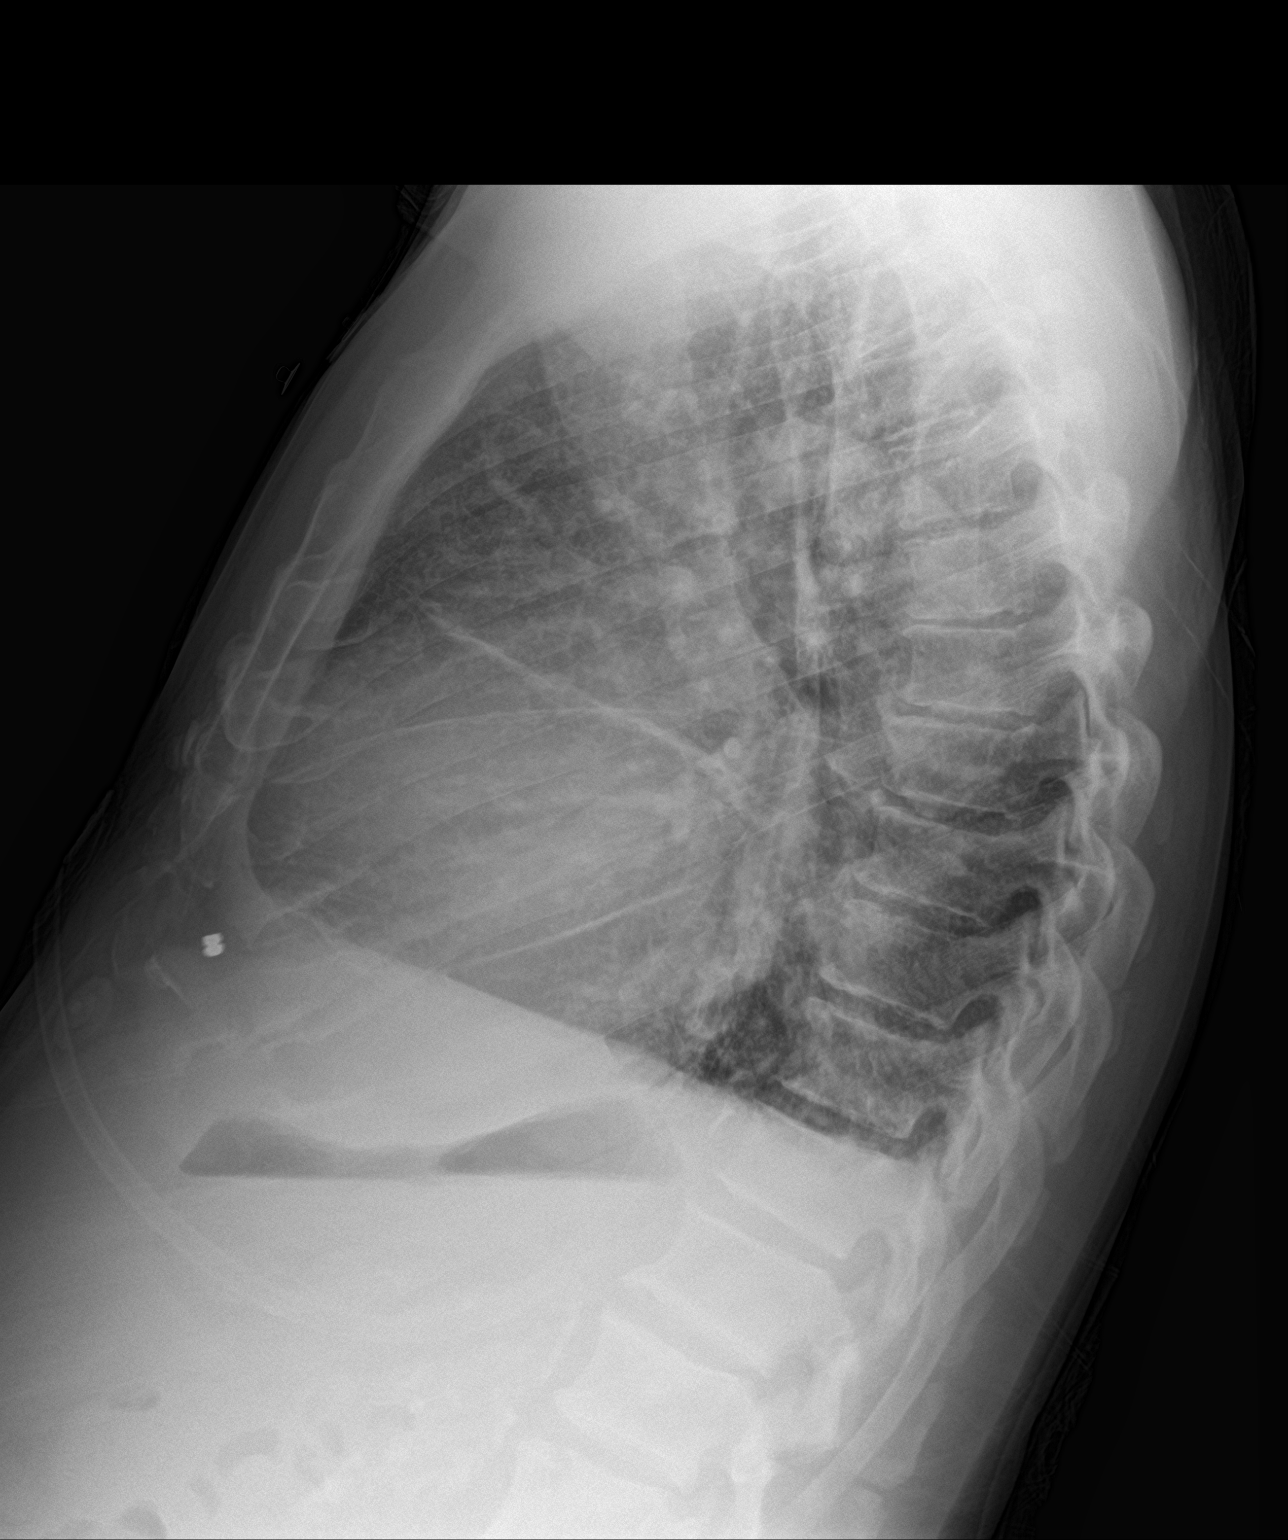

[2 of 2 positions shown; findings below may reference images not displayed]

FINDINGS: Cardiomegaly, stable from previous. Mediastinal silhouette within
normal limits. Aortic atherosclerosis.

Lungs normally inflated. Diffuse vascular and interstitial
congestion, suggesting mild diffuse pulmonary interstitial edema.
Small bilateral pleural effusions. Left perihilar atelectasis and/or
scarring, stable. No other focal infiltrates. No pneumothorax.

No acute osseous finding.
IMPRESSION: 1. Cardiomegaly with mild diffuse pulmonary interstitial edema and
small bilateral pleural effusions, suggesting acute CHF.
2. Superimposed left perihilar atelectasis/scarring.
3. Aortic atherosclerosis.

## 2019-02-18 NOTE — ED Notes (Signed)
Pt called this tech over and stated that he's "had enough oxygen" and had already taken his [place] off. Pt wanted to be pushed outside to wait because it was his [birth year]. Pt is waiting outside of the ED doors at this moment.

## 2019-02-18 NOTE — ED Provider Notes (Signed)
[facility] EMERGENCY DEPARTMENT Provider Note  CSN: [id number] Arrival date & time: 02/18/19 0327  Chief Complaint(s) Chest Pain  HPI [name] is a 58 y.o. male the past medical history listed below.  Well-known to the emergency department for secondary gain.  Does have a history of CHF and chronic peripheral edema who is noncompliant with medications.  History of nearly daily cocaine use.  Presents for chest pain after smoking crack.  Reports that somebody gave him some "bad dope."  Pain has resolved.  However mostly concerned with his chronic edema.  Patient also demanding to get sandwich, drink and a blanket.  HPI  Past Medical History Past Medical History:  Diagnosis Date  . Chronic foot pain   . Cocaine abuse ([place])   . Diabetes mellitus without complication ([place])   . Hepatitis C    unsure   . Homelessness   . Hypertension   . Neuropathy   . Polysubstance abuse ([place])   . Schizophrenia ([place])   . Sleep apnea   . Systolic and diastolic CHF, chronic [facility])    Patient Active Problem List   Diagnosis Date Noted  . CHF (congestive heart failure), NYHA class III, acute on chronic, combined ([place]) 02/02/2019  . Scrotal edema 01/30/2019  . Combined systolic and diastolic heart failure ([place]) 01/19/2019  . Chest pain 01/16/2019  . 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  . Medically noncompliant   . Acute on chronic systolic CHF (congestive heart failure) ([place]) 01/02/2019  . Acute on chronic combined systolic (congestive) and diastolic (congestive) heart failure ([place]) 01/02/2019  . Acute on chronic systolic heart failure ([place]) 12/26/2018  . Mobitz type II atrioventricular block 12/25/2018  . Acute exacerbation of CHF (congestive heart failure) ([place]) 12/22/2018  . Acute on chronic respiratory failure with hypoxia ([place]) 12/22/2018  . CHF exacerbation ([place]) 12/19/2018  . Acute CHF (congestive heart failure) ([place]) 12/16/2018  . Diabetes mellitus without complication ([place])  59/56/3875  . Acute on chronic systolic (congestive) heart failure ([place]) 12/11/2018  . Abdominal pain 12/11/2018  . Nausea vomiting and diarrhea 12/11/2018  . Microcytic anemia 12/11/2018  . Evaluation by psychiatric service required   . MDD (major depressive disorder), severe ([place]) 11/25/2018  . Pressure injury of skin 11/08/2018  . Elevated troponin 10/18/2018  . HLD (hyperlipidemia) 10/18/2018  . Anxiety 10/18/2018  . CHF (congestive heart failure), NYHA class II, acute on chronic, combined ([place]) 10/18/2018  . Hypertensive urgency 10/12/2018  . Mild renal insufficiency 10/12/2018  . Cellulitis 10/12/2018  . Penile cellulitis   . Adjustment disorder with mixed disturbance of emotions and conduct   . Sleep apnea 10/03/2018  . Hypokalemia 09/17/2018  . Scrotal swelling 09/17/2018  . Urinary hesitancy   . Constipation   . Acute on chronic combined systolic and diastolic CHF (congestive heart failure) ([place]) 08/25/2018  . Acute respiratory failure with hypoxia ([place]) 08/25/2018  . Tobacco use 08/18/2018  . Homelessness 08/08/2018  . Smoker 08/08/2018  . Prostate enlargement 03/16/2018  . Aortic atherosclerosis ([place]) 03/16/2018  . 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 Cocaine abuse with cocaine-induced mood disorder ([place]) 09/18/2017  . Chronic foot pain   . Schizoaffective disorder, bipolar type ([place]) 09/30/2016  . Substance induced mood disorder ([place]) 03/13/2015  . Schizophrenia, paranoid type ([place]) 01/17/2015  . Suicidal ideation   . Drug hallucinosis ([place]) 10/08/2014  . Chronic paranoid schizophrenia ([place]) 09/07/2014  . 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  Urinary retention   . Cocaine use disorder, severe, dependence (HCC)   . Essential hypertension 03/28/2013  . Insulin-requiring or dependent type II diabetes mellitus (HCC) 03/15/2013   Home Medication(s) Prior to Admission medications    Medication Sig Start Date End Date Taking? Authorizing Provider  acetaminophen (TYLENOL) 500 MG tablet Take 1 tablet (500 mg total) by mouth every 6 (six) hours as needed. Patient not taking: Reported on 01/19/2019 01/08/19   Fayrene Helperran, [place], PA-C  ARIPiprazole (ABILIFY) 10 MG tablet Take 1 tablet (10 mg total) by mouth daily. Patient not taking: Reported on 01/13/2019 12/10/18   Joycelyn DasPokhrel, [place], MD  carbamazepine (TEGRETOL) 200 MG tablet Take 0.5 tablets (100 mg total) by mouth 2 (two) times daily. Patient not taking: Reported on 01/15/2019 11/26/18   Mancel BaleWentz, Elliott, MD  cephALEXin (KEFLEX) 500 MG capsule Take 1 capsule (500 mg total) by mouth 4 (four) times daily. 02/17/19   Geoffery Lyonselo, Douglas, MD  furosemide (LASIX) 40 MG tablet Take 1 tablet (40 mg total) by mouth 2 (two) times daily. 02/17/19 03/19/19  Harlene SaltsMorelli, Brandon A, PA-C  glipiZIDE (GLUCOTROL) 5 MG tablet Take 0.5 tablets (2.5 mg total) by mouth 2 (two) times daily before a meal. Patient not taking: Reported on 02/14/2019 01/01/19   Rai, Delene Ruffiniipudeep K, MD  Ipratropium-Albuterol (COMBIVENT) 20-100 MCG/ACT AERS respimat Inhale 1 puff into the lungs every 6 (six) hours. Patient not taking: Reported on 01/15/2019 11/30/18   Palumbo, April, MD  lisinopril (ZESTRIL) 10 MG tablet Take 1 tablet (10 mg total) by mouth daily. Patient not taking: Reported on 01/15/2019 11/26/18   Mancel BaleWentz, Elliott, MD  potassium chloride SA (K-DUR) 20 MEQ tablet Take 1 tablet (20 mEq total) by mouth 2 (two) times daily. Patient not taking: Reported on 01/15/2019 11/30/18   Cy BlamerPalumbo, April, MD                                                                                                                                    Past Surgical History Past Surgical History:  Procedure Laterality Date  . MULTIPLE TOOTH EXTRACTIONS     Family History Family History  Problem Relation Age of Onset  . Hypertension Other   . Diabetes Other     Social History Social History   Tobacco Use  . Smoking  status: Current Every Day Smoker    Packs/day: 1.00    Years: 20.00    Pack years: 20.00    Types: Cigarettes  . Smokeless tobacco: Current User  Substance Use Topics  . Alcohol use: Yes    Comment: Daily Drinker   . Drug use: Yes    Frequency: 7.0 times per week    Types: "Crack" cocaine, Cocaine, Marijuana    Comment: Crack   Allergies Haldol [haloperidol]  Review of Systems Review of Systems  Physical Exam Vital Signs  I have reviewed the triage vital signs BP 150/102  HR 101 99% RA  RR 15  Physical Exam Vitals signs reviewed.  Constitutional:      General: He is not in acute distress.    Appearance: He is well-developed. He is not diaphoretic.  HENT:     Head: Normocephalic and atraumatic.     Jaw: No trismus.     Right Ear: External ear normal.     Left Ear: External ear normal.     Nose: Nose normal.  Eyes:     General: No scleral icterus.       Right eye: No discharge.        Left eye: No discharge.     Conjunctiva/sclera: Conjunctivae normal.     Pupils: Pupils are equal, round, and reactive to light.  Neck:     Musculoskeletal: Normal range of motion and neck supple.     Trachea: Phonation normal.  Cardiovascular:     Rate and Rhythm: Regular rhythm. Tachycardia present.     Heart sounds: No murmur. No friction rub. No gallop.      Comments: Anasarca with edema up to lower abd Pulmonary:     Effort: Pulmonary effort is normal. No respiratory distress.     Breath sounds: Normal breath sounds. No stridor. No rales.  Abdominal:     General: There is no distension.     Palpations: Abdomen is soft.     Tenderness: There is no abdominal tenderness.  Musculoskeletal: Normal range of motion.        General: No tenderness.     Right lower leg: 2+ Pitting Edema present.     Left lower leg: 2+ Pitting Edema present.  Skin:    General: Skin is warm and dry.     Findings: No erythema or rash.  Neurological:     Mental Status: He is alert and oriented to  person, place, and time.  Psychiatric:        Behavior: Behavior normal.     ED Results and Treatments Labs (all labs ordered are listed, but only abnormal results are displayed) Labs Reviewed - No data to display                                                                                                                       EKG  EKG Interpretation  Date/Time:    Ventricular Rate:    PR Interval:    QRS Duration:   QT Interval:    QTC Calculation:   R Axis:     Text Interpretation:        Radiology No results found.  Pertinent labs & imaging results that were available during my care of the patient were reviewed by me and considered in my medical decision making (see chart for details).  Medications Ordered in ED Medications - No data to display  Procedures Procedures  (including critical care time)  Medical Decision Making / ED Course I have reviewed the nursing notes for this encounter and the patient's prior records (if available in EHR or on provided paperwork).   [name] was evaluated in Emergency Department on 02/18/2019 for the symptoms described in the history of present illness. He was evaluated in the context of the global COVID-19 pandemic, which necessitated consideration that the patient might be at risk for infection with the SARS-CoV-2 virus that causes COVID-19. Institutional protocols and algorithms that pertain to the evaluation of patients at risk for COVID-19 are in a state of rapid change based on information released by regulatory bodies including the CDC and federal and state organizations. These policies and algorithms were followed during the patient's care in the ED.  Unchanged EKG. Lungs clear. Sating well on RA. Speaks in full sentences. No acute distress. Refused lasix.   The patient appears reasonably  screened and/or stabilized for discharge and I doubt any other medical condition or other [facility] requiring further screening, evaluation, or treatment in the ED at this time prior to discharge.  The patient is safe for discharge with strict return precautions.       Final Clinical Impression(s) / ED Diagnoses Final diagnoses:  None     The patient appears reasonably screened and/or stabilized for discharge and I doubt any other medical condition or other [facility] requiring further screening, evaluation, or treatment in the ED at this time prior to discharge.  Disposition: Discharge  Condition: Good  I have discussed the results, Dx and Tx plan with the patient who expressed understanding and agree(s) with the plan. Discharge instructions discussed at great length. The patient was given strict return precautions who verbalized understanding of the instructions. No further questions at time of discharge.    ED Discharge Orders    None        This chart was dictated using voice recognition software.  Despite best efforts to proofread,  errors can occur which can change the documentation meaning.   Nira Connardama, Felisha Claytor Eduardo, MD 02/18/19 0700

## 2019-02-18 NOTE — ED Triage Notes (Signed)
Pt BIB [facility] for eval of his chronic testicle swelling and shortness of breath. EMS reports pt was being charged w/ trespassing by [employer] when he simultaneously developed these sx. Pt respiratory status appears baseline in triage.

## 2019-02-19 ENCOUNTER — Encounter (HOSPITAL_COMMUNITY): Payer: Self-pay | Admitting: Emergency Medicine

## 2019-02-19 ENCOUNTER — Other Ambulatory Visit: Payer: Self-pay

## 2019-02-19 ENCOUNTER — Emergency Department (HOSPITAL_COMMUNITY): Payer: Medicaid Other

## 2019-02-19 ENCOUNTER — Emergency Department (HOSPITAL_COMMUNITY)
Admission: EM | Admit: 2019-02-19 | Discharge: 2019-02-19 | Disposition: A | Discharge status: Home / Self Care | Payer: Medicaid Other | Source: Home / Self Care | Attending: Emergency Medicine | Admitting: Emergency Medicine

## 2019-02-19 ENCOUNTER — Encounter (HOSPITAL_COMMUNITY): Payer: Self-pay

## 2019-02-19 DIAGNOSIS — Z5900 Homelessness unspecified: Secondary | ICD-10-CM

## 2019-02-19 DIAGNOSIS — R601 Generalized edema: Secondary | ICD-10-CM

## 2019-02-19 LAB — BASIC METABOLIC PANEL
Anion gap: 13 (ref 5–15)
BUN: 13 mg/dL (ref 6–20)
CO2: 28 mmol/L (ref 22–32)
Calcium: 8.7 mg/dL — ABNORMAL LOW (ref 8.9–10.3)
Chloride: 97 mmol/L — ABNORMAL LOW (ref 98–111)
Creatinine, Ser: 0.87 mg/dL (ref 0.61–1.24)
GFR calc Af Amer: >60 mL/min (ref >60)
GFR calc non Af Amer: >60 mL/min (ref >60)
Glucose, Bld: 304 mg/dL — ABNORMAL HIGH (ref 70–99)
Potassium: 3.4 mmol/L — ABNORMAL LOW (ref 3.5–5.1)
Sodium: 138 mmol/L (ref 135–145)

## 2019-02-19 LAB — CBC WITH DIFFERENTIAL/PLATELET
Abs Immature Granulocytes: 0 10*3/uL (ref 0.00–0.07)
Basophils Absolute: 0.2 10*3/uL — ABNORMAL HIGH (ref 0.0–0.1)
Basophils Relative: 3 %
Eosinophils Absolute: 0 10*3/uL (ref 0.0–0.5)
Eosinophils Relative: 0 %
HCT: 31.8 % — ABNORMAL LOW (ref 39.0–52.0)
Hemoglobin: 8.7 g/dL — ABNORMAL LOW (ref 13.0–17.0)
Lymphocytes Relative: 11 %
Lymphs Abs: 0.8 10*3/uL (ref 0.7–4.0)
MCH: 21 pg — ABNORMAL LOW (ref 26.0–34.0)
MCHC: 27.4 g/dL — ABNORMAL LOW (ref 30.0–36.0)
MCV: 76.8 fL — ABNORMAL LOW (ref 80.0–100.0)
Monocytes Absolute: 0.6 10*3/uL (ref 0.1–1.0)
Monocytes Relative: 8 %
Neutro Abs: 5.9 10*3/uL (ref 1.7–7.7)
Neutrophils Relative %: 78 %
Platelets: 358 10*3/uL (ref 150–400)
RBC: 4.14 MIL/uL — ABNORMAL LOW (ref 4.22–5.81)
RDW: 21.8 % — ABNORMAL HIGH (ref 11.5–15.5)
WBC: 7.6 10*3/uL (ref 4.0–10.5)
nRBC: 0 /100 WBC
nRBC: 0.3 % — ABNORMAL HIGH (ref 0.0–0.2)

## 2019-02-19 MED ORDER — FUROSEMIDE 10 MG/ML IJ SOLN: 40.0000 mg | Freq: Once | INTRAMUSCULAR | Status: DC

## 2019-02-19 MED ORDER — FUROSEMIDE 10 MG/ML IJ SOLN
40.0000 mg | Freq: Once | INTRAMUSCULAR | Status: AC
  Administered 2019-02-19: 40 mg via INTRAVENOUS
  Filled 2019-02-19: qty 4

## 2019-02-19 MED ORDER — ACETAMINOPHEN 500 MG PO TABS
1000.0000 mg | ORAL_TABLET | Freq: Once | ORAL | Status: AC
  Administered 2019-02-19: 03:00:00 1000 mg via ORAL
  Filled 2019-02-19: qty 2

## 2019-02-19 MED ORDER — BACITRACIN ZINC 500 UNIT/GM EX OINT
TOPICAL_OINTMENT | Freq: Once | CUTANEOUS | Status: AC
  Administered 2019-02-19: 1 via TOPICAL
  Filled 2019-02-19: qty 0.9

## 2019-02-19 MED ORDER — ACETAMINOPHEN 325 MG PO TABS
650.0000 mg | ORAL_TABLET | Freq: Once | ORAL | Status: AC
  Administered 2019-02-19: 650 mg via ORAL
  Filled 2019-02-19: qty 2

## 2019-02-19 MED ORDER — BACITRACIN ZINC 500 UNIT/GM EX OINT
TOPICAL_OINTMENT | Freq: Two times a day (BID) | CUTANEOUS | Status: DC
  Administered 2019-02-19: 03:00:00 via TOPICAL
  Filled 2019-02-19: qty 0.9

## 2019-02-19 MED ORDER — FUROSEMIDE 20 MG PO TABS
80.0000 mg | ORAL_TABLET | Freq: Once | ORAL | Status: AC
  Administered 2019-02-19: 03:00:00 80 mg via ORAL
  Filled 2019-02-19: qty 4

## 2019-02-19 NOTE — ED Notes (Signed)
Pt alert and oriented, states he is very tired and wants to sleep here.  Pt states he is not in any pain but does have a skin tear on his penis which is very swollen.

## 2019-02-19 NOTE — ED Notes (Signed)
Patient placed on 2L [place] pulse oximetry on room air 88%-89%.

## 2019-02-19 NOTE — ED Triage Notes (Signed)
Pt bib ems for leg pain, requesting a social worker to get a wheelchair, states he is on his feet too much and needs a wheelchair. Pt place on 3l [place] du to [employer] and sats in the 80s.

## 2019-02-19 NOTE — ED Provider Notes (Signed)
[facility] Provider Note   CSN: [id number] Arrival date & time: 02/19/19  1441     History   Chief Complaint Chief Complaint  Patient presents with  . Leg Pain    HPI [name] is a 58 y.o. male who presents with leg swelling and difficulty walking. He states that he is having a lot of trouble walking because his legs are so swollen. He states he is on his feet all day "like a fool". He thinks he needs a wheelchair. Additionally he reports swelling of his penis and testicles. This is a chronic problem. He has a sore area on his penis that he thinks is infected. He was here yesterday and had a dressing placed on it. It gets dirty though because he has to take Lasix and he urinates a lot. He reports SOB as well. No chest pain. He is requesting Tylenol, Sprite, a wheelchair, and outpatient resources for his crack use.     HPI  Past Medical History:  Diagnosis Date  . Chronic foot pain   . Cocaine abuse (HCC)   . Diabetes mellitus without complication (HCC)   . Hepatitis C    unsure   . Homelessness   . Hypertension   . Neuropathy   . Polysubstance abuse (HCC)   . Schizophrenia (HCC)   . Sleep apnea   . Systolic and diastolic CHF, chronic [facility](HCC)     Patient Active Problem List   Diagnosis Date Noted  . CHF (congestive heart failure), NYHA class III, acute on chronic, combined (HCC) 02/02/2019  . Scrotal edema 01/30/2019  . Combined systolic and diastolic heart failure (HCC) 01/19/2019  . Chest pain 01/16/2019  . 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  . Medically noncompliant   . Acute on chronic systolic CHF (congestive heart failure) (HCC) 01/02/2019  . 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 Acute on chronic systolic heart failure (HCC) 12/26/2018  . Mobitz type II atrioventricular block 12/25/2018  . Acute exacerbation of CHF (congestive heart failure) (HCC) 12/22/2018   . Acute on chronic respiratory failure with hypoxia (HCC) 12/22/2018  . CHF exacerbation (HCC) 12/19/2018  . Acute CHF (congestive heart failure) (HCC) 12/16/2018  . Diabetes mellitus without complication (HCC) 12/11/2018  . Acute on chronic systolic (congestive) heart failure (HCC) 12/11/2018  . Abdominal pain 12/11/2018  . Nausea vomiting and diarrhea 12/11/2018  . Microcytic anemia 12/11/2018  . Evaluation by psychiatric service required   . MDD (major depressive disorder), severe (HCC) 11/25/2018  . Pressure injury of skin 11/08/2018  . Elevated troponin 10/18/2018  . HLD (hyperlipidemia) 10/18/2018  . Anxiety 10/18/2018  . CHF (congestive heart failure), NYHA class II, acute on chronic, combined (HCC) 10/18/2018  . Hypertensive urgency 10/12/2018  . Mild renal insufficiency 10/12/2018  . Cellulitis 10/12/2018  . Penile cellulitis   . Adjustment disorder with mixed disturbance of emotions and conduct   . Sleep apnea 10/03/2018  . Hypokalemia 09/17/2018  . Scrotal swelling 09/17/2018  . Urinary hesitancy   . Constipation   . Acute on chronic combined systolic and diastolic CHF (congestive heart failure) (HCC) 08/25/2018  . Acute respiratory failure with hypoxia (HCC) 08/25/2018  . Tobacco use 08/18/2018  . Homelessness 08/08/2018  . Smoker 08/08/2018  . Prostate enlargement 03/16/2018  . Aortic atherosclerosis (HCC) 03/16/2018  . 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 Cocaine abuse with cocaine-induced mood disorder (HCC) 09/18/2017  . Chronic foot  pain   . Schizoaffective disorder, bipolar type (HCC) 09/30/2016  . Substance induced mood disorder (HCC) 03/13/2015  . Schizophrenia, paranoid type (HCC) 01/17/2015  . Suicidal ideation   . Drug hallucinosis (HCC) 10/08/2014  . Chronic paranoid schizophrenia (HCC) 09/07/2014  . 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 Urinary retention   .  Cocaine use disorder, severe, dependence (HCC)   . Essential hypertension 03/28/2013  . 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 MULTIPLE TOOTH EXTRACTIONS          Home Medications    Prior to Admission medications   Medication Sig Start Date End Date Taking? Authorizing Provider  acetaminophen (TYLENOL) 500 MG tablet Take 1 tablet (500 mg total) by mouth every 6 (six) hours as needed. Patient not taking: Reported on 01/19/2019 01/08/19   Fayrene Helperran, Bowie, PA-C  ARIPiprazole (ABILIFY) 10 MG tablet Take 1 tablet (10 mg total) by mouth daily. Patient not taking: Reported on 01/13/2019 12/10/18   Joycelyn DasPokhrel, Laxman, MD  carbamazepine (TEGRETOL) 200 MG tablet Take 0.5 tablets (100 mg total) by mouth 2 (two) times daily. Patient not taking: Reported on 01/15/2019 11/26/18   Mancel BaleWentz, Elliott, MD  cephALEXin (KEFLEX) 500 MG capsule Take 1 capsule (500 mg total) by mouth 4 (four) times daily. Patient not taking: Reported on 02/19/2019 02/17/19   Geoffery Lyonselo, Douglas, MD  furosemide (LASIX) 40 MG tablet Take 1 tablet (40 mg total) by mouth 2 (two) times daily. 02/17/19 03/19/19  Harlene SaltsMorelli, Brandon A, PA-C  glipiZIDE (GLUCOTROL) 5 MG tablet Take 0.5 tablets (2.5 mg total) by mouth 2 (two) times daily before a meal. 01/01/19   Rai, Ripudeep K, MD  Ipratropium-Albuterol (COMBIVENT) 20-100 MCG/ACT AERS respimat Inhale 1 puff into the lungs every 6 (six) hours. Patient not taking: Reported on 01/15/2019 11/30/18   Palumbo, April, MD  lisinopril (ZESTRIL) 10 MG tablet Take 1 tablet (10 mg total) by mouth daily. Patient not taking: Reported on 01/15/2019 11/26/18   Mancel BaleWentz, Elliott, MD  potassium chloride SA (K-DUR) 20 MEQ tablet Take 1 tablet (20 mEq total) by mouth 2 (two) times daily. Patient not taking: Reported on 01/15/2019 11/30/18   Nicanor AlconPalumbo, April, MD    Family History Family History  Problem Relation Age of Onset  . Hypertension Other    . Diabetes Other     Social History Social History   Tobacco Use  . Smoking status: Current Every Day Smoker    Packs/day: 1.00    Years: 20.00    Pack years: 20.00    Types: Cigarettes  . Smokeless tobacco: Current User  Substance Use Topics  . Alcohol use: Yes    Comment: Daily Drinker   . Drug use: Yes    Frequency: 7.0 times per week    Types: "Crack" cocaine, Cocaine, Marijuana    Comment: Crack     Allergies   Haldol [haloperidol]   Review of Systems Review of Systems  Constitutional: Negative for fever.  Respiratory: Positive for shortness of breath. Negative for cough.   Cardiovascular: Positive for leg swelling. Negative for chest pain.  Gastrointestinal: Negative for abdominal pain.  Genitourinary: Positive for frequency and genital sores.  Skin: Positive for wound.  All other systems reviewed and are negative.    Physical Exam Updated Vital Signs BP (!) 154/107   Pulse (!) 110   Temp 97.8 F (36.6 C) (Oral)  Resp 20   SpO2 99%   Physical Exam Vitals signs and nursing note reviewed.  Constitutional:      General: He is not in acute distress.    Appearance: Normal appearance. He is well-developed. He is not ill-appearing.     Comments: Sleeping. Easily arousable and conversant. NAD. He has anasarca  HENT:     Head: Normocephalic and atraumatic.  Eyes:     General: No scleral icterus.       Right eye: No discharge.        Left eye: No discharge.     Conjunctiva/sclera: Conjunctivae normal.     Pupils: Pupils are equal, round, and reactive to light.  Neck:     Musculoskeletal: Normal range of motion.  Cardiovascular:     Rate and Rhythm: Normal rate and regular rhythm.  Pulmonary:     Effort: Pulmonary effort is normal. No respiratory distress.     Breath sounds: Normal breath sounds.  Abdominal:     General: There is no distension.     Palpations: Abdomen is soft.     Tenderness: There is no abdominal tenderness.  Genitourinary:     Comments: Diffuse genital swelling. There is a area of open skin over the left base of the penis due to significant edema. No signs of infection noted Musculoskeletal:     Right lower leg: Edema present.     Left lower leg: Edema present.  Skin:    General: Skin is warm and dry.  Neurological:     Mental Status: He is alert and oriented to person, place, and time.  Psychiatric:        Behavior: Behavior normal.      ED Treatments / Results  Labs (all labs ordered are listed, but only abnormal results are displayed) Labs Reviewed  BASIC METABOLIC PANEL - Abnormal; Notable for the following components:      Result Value   Potassium 3.4 (*)    Chloride 97 (*)    Glucose, Bld 304 (*)    Calcium 8.7 (*)    All other components within normal limits  CBC WITH DIFFERENTIAL/PLATELET - Abnormal; Notable for the following components:   RBC 4.14 (*)    Hemoglobin 8.7 (*)    HCT 31.8 (*)    MCV 76.8 (*)    MCH 21.0 (*)    MCHC 27.4 (*)    RDW 21.8 (*)    nRBC 0.3 (*)    Basophils Absolute 0.2 (*)    All other components within normal limits  BRAIN NATRIURETIC PEPTIDE    EKG None  Radiology Dg Chest Port 1 View  Result Date: 02/19/2019 CLINICAL DATA:  Shortness of breath. EXAM: PORTABLE CHEST 1 VIEW COMPARISON:  Multiple prior exams most recently 02/12/2019 FINDINGS: Chronic cardiomegaly. Unchanged mediastinal contours. Left midlung scarring is unchanged. Similar appearance of retrocardiac opacity prior exams. Vascular congestion without pulmonary edema. No pneumothorax or acute airspace disease. IMPRESSION: Unchanged radiographic appearance of the chest with stable cardiomegaly and left midlung scarring. Electronically Signed   By: Keith Rake M.D.   On: 02/19/2019 22:49    Procedures Procedures (including critical care time)  Medications Ordered in ED Medications  acetaminophen (TYLENOL) tablet 650 mg (650 mg Oral Given 02/19/19 2109)  furosemide (LASIX) injection 40 mg  (40 mg Intravenous Given 02/19/19 2111)  bacitracin ointment (1 application Topical Given 02/19/19 2110)     Initial Impression / Assessment and Plan / ED Course  I have reviewed the  triage vital signs and the nursing notes.  Pertinent labs & imaging results that were available during my care of the patient were reviewed by me and considered in my medical decision making (see chart for details).  58 year old male presents with leg swelling.  Nursing put him on 2 L because when he falls asleep he becomes mildly hypoxic.  When he is awake he is satting at 95%.  He is mildly tachycardic which is chronic.  He has anasarca on exam.  He is noncompliant with his Lasix and therefore was given a dose here.  He is also complaining about a sore on his penis.  I do not see any acute infection here.  Local wound care was done by nursing.  Consulted case management per his request for a wheelchair.  Patient is asking for multiple drinks and snacks.  Initially labs and chest x-ray were obtained due to transient hypoxia however since there is no persistent hypoxia and he is not significantly short of breath will discharge.    Final Clinical Impressions(s) / ED Diagnoses   Final diagnoses:  Anasarca  Homeless    ED Discharge Orders    None       Beryle QuantGekas, Rebeka Kimble Marie, PA-C 02/19/19 2337    Raeford RazorKohut, Stephen, MD 02/22/19 1020

## 2019-02-19 NOTE — Progress Notes (Signed)
Consult request has been received. Case Management will follow up on tomorrow regarding Wheel Chair request.   Dorothea Yow Montel Clock Transitions of Care  Clinical Social Worker  Ph: [phone]

## 2019-02-19 NOTE — ED Provider Notes (Signed)
[name] EMERGENCY DEPARTMENT Provider Note   CSN: [id number] Arrival date & time: 02/18/19  1844     History   Chief Complaint Chief Complaint  Patient presents with  . Groin Swelling  . Shortness of Breath    HPI [name] is a 58 y.o. male.      Illness Location:  Lower extremities Quality:  Swollen Severity:  Moderate Onset quality:  Gradual Timing:  Constant Progression:  Unchanged Chronicity:  Chronic Context:  History of chf, anasarca and non compliance Relieved by:  Nothing Worsened by:  Nothing Ineffective treatments:  None tried Associated symptoms: no chest pain, no congestion, no cough, no diarrhea, no ear pain, no fatigue, no fever, no headaches, no loss of consciousness, no myalgias, no rash, no rhinorrhea, no sore throat, no vomiting and no wheezing   Risk factors:  Noncompliance   Past Medical History:  Diagnosis Date  . Chronic foot pain   . Cocaine abuse (HCC)   . Diabetes mellitus without complication (HCC)   . Hepatitis C    unsure   . Homelessness   . Hypertension   . Neuropathy   . Polysubstance abuse (HCC)   . Schizophrenia (HCC)   . Sleep apnea   . Systolic and diastolic CHF, chronic [name](HCC)     Patient Active Problem List   Diagnosis Date Noted  . CHF (congestive heart failure), NYHA class III, acute on chronic, combined (HCC) 02/02/2019  . Scrotal edema 01/30/2019  . Combined systolic and diastolic heart failure (HCC) 01/19/2019  . Chest pain 01/16/2019  . 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  . Medically noncompliant   . Acute on chronic systolic CHF (congestive heart failure) (HCC) 01/02/2019  . 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 Acute on chronic systolic heart failure (HCC) 12/26/2018  . Mobitz type II atrioventricular block 12/25/2018  . Acute exacerbation of CHF (congestive heart failure) (HCC) 12/22/2018  . Acute on chronic  respiratory failure with hypoxia (HCC) 12/22/2018  . CHF exacerbation (HCC) 12/19/2018  . Acute CHF (congestive heart failure) (HCC) 12/16/2018  . Diabetes mellitus without complication (HCC) 12/11/2018  . Acute on chronic systolic (congestive) heart failure (HCC) 12/11/2018  . Abdominal pain 12/11/2018  . Nausea vomiting and diarrhea 12/11/2018  . Microcytic anemia 12/11/2018  . Evaluation by psychiatric service required   . MDD (major depressive disorder), severe (HCC) 11/25/2018  . Pressure injury of skin 11/08/2018  . Elevated troponin 10/18/2018  . HLD (hyperlipidemia) 10/18/2018  . Anxiety 10/18/2018  . CHF (congestive heart failure), NYHA class II, acute on chronic, combined (HCC) 10/18/2018  . Hypertensive urgency 10/12/2018  . Mild renal insufficiency 10/12/2018  . Cellulitis 10/12/2018  . Penile cellulitis   . Adjustment disorder with mixed disturbance of emotions and conduct   . Sleep apnea 10/03/2018  . Hypokalemia 09/17/2018  . Scrotal swelling 09/17/2018  . Urinary hesitancy   . Constipation   . Acute on chronic combined systolic and diastolic CHF (congestive heart failure) (HCC) 08/25/2018  . Acute respiratory failure with hypoxia (HCC) 08/25/2018  . Tobacco use 08/18/2018  . Homelessness 08/08/2018  . Smoker 08/08/2018  . Prostate enlargement 03/16/2018  . Aortic atherosclerosis (HCC) 03/16/2018  . 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 Cocaine abuse with cocaine-induced mood disorder (HCC) 09/18/2017  . Chronic foot pain   . Schizoaffective disorder, bipolar type (HCC) 09/30/2016  . Substance induced mood disorder (HCC) 03/13/2015  . Schizophrenia, paranoid  type (HCC) 01/17/2015  . Suicidal ideation   . Drug hallucinosis (HCC) 10/08/2014  . Chronic paranoid schizophrenia (HCC) 09/07/2014  . 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 Urinary retention   . Cocaine use disorder,  severe, dependence (HCC)   . Essential hypertension 03/28/2013  . 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 MULTIPLE TOOTH EXTRACTIONS          Home Medications    Prior to Admission medications   Medication Sig Start Date End Date Taking? Authorizing Provider  furosemide (LASIX) 40 MG tablet Take 1 tablet (40 mg total) by mouth 2 (two) times daily. 02/17/19 03/19/19 Yes Harlene SaltsMorelli, Brandon A, PA-C  glipiZIDE (GLUCOTROL) 5 MG tablet Take 0.5 tablets (2.5 mg total) by mouth 2 (two) times daily before a meal. 01/01/19  Yes Rai, Ripudeep K, MD  acetaminophen (TYLENOL) 500 MG tablet Take 1 tablet (500 mg total) by mouth every 6 (six) hours as needed. Patient not taking: Reported on 01/19/2019 01/08/19   Fayrene Helperran, Bowie, PA-C  ARIPiprazole (ABILIFY) 10 MG tablet Take 1 tablet (10 mg total) by mouth daily. Patient not taking: Reported on 01/13/2019 12/10/18   Joycelyn DasPokhrel, Laxman, MD  carbamazepine (TEGRETOL) 200 MG tablet Take 0.5 tablets (100 mg total) by mouth 2 (two) times daily. Patient not taking: Reported on 01/15/2019 11/26/18   Mancel BaleWentz, Elliott, MD  cephALEXin (KEFLEX) 500 MG capsule Take 1 capsule (500 mg total) by mouth 4 (four) times daily. Patient not taking: Reported on 02/19/2019 02/17/19   Geoffery Lyonselo, Douglas, MD  Ipratropium-Albuterol (COMBIVENT) 20-100 MCG/ACT AERS respimat Inhale 1 puff into the lungs every 6 (six) hours. Patient not taking: Reported on 01/15/2019 11/30/18   Hermann Dottavio, MD  lisinopril (ZESTRIL) 10 MG tablet Take 1 tablet (10 mg total) by mouth daily. Patient not taking: Reported on 01/15/2019 11/26/18   Mancel BaleWentz, Elliott, MD  potassium chloride SA (K-DUR) 20 MEQ tablet Take 1 tablet (20 mEq total) by mouth 2 (two) times daily. Patient not taking: Reported on 01/15/2019 11/30/18   Nicanor AlconPalumbo, Gurtej Noyola, MD    Family History Family History  Problem Relation Age of Onset  . Hypertension Other   . Diabetes  Other     Social History Social History   Tobacco Use  . Smoking status: Current Every Day Smoker    Packs/day: 1.00    Years: 20.00    Pack years: 20.00    Types: Cigarettes  . Smokeless tobacco: Current User  Substance Use Topics  . Alcohol use: Yes    Comment: Daily Drinker   . Drug use: Yes    Frequency: 7.0 times per week    Types: "Crack" cocaine, Cocaine, Marijuana    Comment: Crack     Allergies   Haldol [haloperidol]   Review of Systems Review of Systems  Constitutional: Negative for fatigue and fever.  HENT: Negative for congestion, ear pain, rhinorrhea and sore throat.   Eyes: Negative for visual disturbance.  Respiratory: Negative for cough and wheezing.   Cardiovascular: Negative for chest pain.  Gastrointestinal: Negative for diarrhea and vomiting.  Genitourinary: Negative for difficulty urinating.  Musculoskeletal: Negative for myalgias.  Skin: Negative for rash.  Neurological: Negative for loss of consciousness and headaches.  Psychiatric/Behavioral: Negative for agitation.  All other systems reviewed and are negative.    Physical Exam Updated Vital Signs BP (!) 159/123 (BP Location: Left Arm)  Pulse (!) 110   Temp 99.5 F (37.5 C) (Oral)   Resp (!) 24   Ht 5\' 9"  (1.753 m)   Wt 108 kg   SpO2 97%   BMI 35.16 kg/m   Physical Exam Vitals signs reviewed.  Constitutional:      General: He is not in acute distress.    Appearance: He is obese. He is not ill-appearing.  HENT:     Head: Normocephalic and atraumatic.     Nose: Nose normal.     Mouth/Throat:     Mouth: Mucous membranes are moist.     Pharynx: Oropharynx is clear.  Eyes:     Conjunctiva/sclera: Conjunctivae normal.     Pupils: Pupils are equal, round, and reactive to light.  Neck:     Musculoskeletal: Normal range of motion and neck supple.  Cardiovascular:     Rate and Rhythm: Normal rate and regular rhythm.     Pulses: Normal pulses.     Heart sounds: Normal heart  sounds.  Pulmonary:     Effort: Pulmonary effort is normal. No respiratory distress.     Breath sounds: No wheezing or rales.  Abdominal:     General: Abdomen is flat. Bowel sounds are normal.     Tenderness: There is no abdominal tenderness.  Musculoskeletal: Normal range of motion.     Right lower leg: Edema present.     Left lower leg: Edema present.  Skin:    General: Skin is warm and dry.     Capillary Refill: Capillary refill takes less than 2 seconds.  Neurological:     General: No focal deficit present.     Mental Status: He is alert and oriented to person, place, and time.  Psychiatric:        Mood and Affect: Mood normal.        Behavior: Behavior normal.      ED Treatments / Results  Labs (all labs ordered are listed, but only abnormal results are displayed) Labs Reviewed - No data to display  EKG None  Radiology No results found.  Procedures Procedures (including critical care time)  Medications Ordered in ED Medications  acetaminophen (TYLENOL) tablet 1,000 mg (has no administration in time range)  furosemide (LASIX) injection 40 mg (has no administration in time range)  bacitracin ointment (has no administration in time range)     Seen daily for same.  Well known to me given meds in the ED. No acute need for admission at this time.   XSAVIER SEELEY was evaluated in Emergency Department on 02/19/2019 for the symptoms described in the history of present illness. He was evaluated in the context of the global COVID-19 pandemic, which necessitated consideration that the patient might be at risk for infection with the SARS-CoV-2 virus that causes COVID-19. Institutional protocols and algorithms that pertain to the evaluation of patients at risk for COVID-19 are in a state of rapid change based on information released by regulatory bodies including the CDC and federal and state organizations. These policies and algorithms were followed during the patient's  care in the ED.   Final Clinical Impressions(s) / ED Diagnoses   Final diagnoses:  Homelessness   Return for intractable cough, coughing up blood,fevers >100.4 unrelieved by medication, shortness of breath, intractable vomiting, chest pain, shortness of breath, weakness,numbness, changes in speech, facial asymmetry,abdominal pain, passing out,Inability to tolerate liquids or food, cough, altered mental status or any concerns. No signs of systemic illness or infection. The  patient is nontoxic-appearing on exam and vital signs are within normal limits.   I have reviewed the triage vital signs and the nursing notes. Pertinent labs &imaging results that were available during my care of the patient were reviewed by me and considered in my medical decision making (see chart for details).  After history, exam, and medical workup I feel the patient has been appropriately medically screened and is safe for discharge home. Pertinent diagnoses were discussed with the patient. Patient was given return precautions   Ilea Hilton, MD 02/19/19 [id number]

## 2019-02-19 NOTE — ED Notes (Signed)
This RN charted under the wrong name.

## 2019-02-19 NOTE — ED Notes (Signed)
States smoked cracked yesterday and his homeless.

## 2019-02-20 ENCOUNTER — Emergency Department (HOSPITAL_COMMUNITY)
Admission: EM | Admit: 2019-02-20 | Discharge: 2019-02-20 | Disposition: A | Discharge status: Home / Self Care | Payer: Medicaid Other | Attending: Emergency Medicine | Admitting: Emergency Medicine

## 2019-02-20 ENCOUNTER — Encounter (HOSPITAL_COMMUNITY): Payer: Self-pay

## 2019-02-20 ENCOUNTER — Emergency Department (HOSPITAL_COMMUNITY)
Admission: EM | Admit: 2019-02-20 | Discharge: 2019-02-20 | Disposition: A | Discharge status: Home / Self Care | Payer: Medicaid Other | Source: Home / Self Care | Attending: Emergency Medicine | Admitting: Emergency Medicine

## 2019-02-20 ENCOUNTER — Other Ambulatory Visit: Payer: Self-pay

## 2019-02-20 ENCOUNTER — Encounter (HOSPITAL_COMMUNITY): Payer: Self-pay | Admitting: *Deleted

## 2019-02-20 DIAGNOSIS — E1140 Type 2 diabetes mellitus with diabetic neuropathy, unspecified: Secondary | ICD-10-CM | POA: Diagnosis not present

## 2019-02-20 DIAGNOSIS — Z5900 Homelessness unspecified: Secondary | ICD-10-CM

## 2019-02-20 DIAGNOSIS — M79605 Pain in left leg: Secondary | ICD-10-CM

## 2019-02-20 DIAGNOSIS — R0602 Shortness of breath: Secondary | ICD-10-CM | POA: Insufficient documentation

## 2019-02-20 DIAGNOSIS — R601 Generalized edema: Secondary | ICD-10-CM

## 2019-02-20 DIAGNOSIS — N485 Ulcer of penis: Secondary | ICD-10-CM | POA: Diagnosis not present

## 2019-02-20 DIAGNOSIS — F1490 Cocaine use, unspecified, uncomplicated: Secondary | ICD-10-CM | POA: Insufficient documentation

## 2019-02-20 DIAGNOSIS — F17210 Nicotine dependence, cigarettes, uncomplicated: Secondary | ICD-10-CM | POA: Insufficient documentation

## 2019-02-20 DIAGNOSIS — N4829 Other inflammatory disorders of penis: Secondary | ICD-10-CM | POA: Diagnosis present

## 2019-02-20 DIAGNOSIS — I110 Hypertensive heart disease with heart failure: Secondary | ICD-10-CM | POA: Insufficient documentation

## 2019-02-20 DIAGNOSIS — I509 Heart failure, unspecified: Secondary | ICD-10-CM | POA: Insufficient documentation

## 2019-02-20 DIAGNOSIS — Z7984 Long term (current) use of oral hypoglycemic drugs: Secondary | ICD-10-CM | POA: Diagnosis not present

## 2019-02-20 DIAGNOSIS — M79661 Pain in right lower leg: Secondary | ICD-10-CM | POA: Insufficient documentation

## 2019-02-20 DIAGNOSIS — R600 Localized edema: Secondary | ICD-10-CM | POA: Insufficient documentation

## 2019-02-20 DIAGNOSIS — I5042 Chronic combined systolic (congestive) and diastolic (congestive) heart failure: Secondary | ICD-10-CM | POA: Insufficient documentation

## 2019-02-20 DIAGNOSIS — F10920 Alcohol use, unspecified with intoxication, uncomplicated: Secondary | ICD-10-CM | POA: Diagnosis not present

## 2019-02-20 DIAGNOSIS — M79662 Pain in left lower leg: Secondary | ICD-10-CM | POA: Insufficient documentation

## 2019-02-20 DIAGNOSIS — M79604 Pain in right leg: Secondary | ICD-10-CM

## 2019-02-20 DIAGNOSIS — E119 Type 2 diabetes mellitus without complications: Secondary | ICD-10-CM | POA: Insufficient documentation

## 2019-02-20 DIAGNOSIS — G8929 Other chronic pain: Secondary | ICD-10-CM | POA: Insufficient documentation

## 2019-02-20 DIAGNOSIS — R609 Edema, unspecified: Secondary | ICD-10-CM

## 2019-02-20 LAB — BRAIN NATRIURETIC PEPTIDE: B Natriuretic Peptide: 1064.4 pg/mL — ABNORMAL HIGH (ref 0.0–100.0)

## 2019-02-20 MED ORDER — ACETAMINOPHEN 500 MG PO TABS
1000.0000 mg | ORAL_TABLET | Freq: Once | ORAL | Status: DC
  Filled 2019-02-20: qty 2

## 2019-02-20 MED ORDER — ACETAMINOPHEN 500 MG PO TABS
1000.0000 mg | ORAL_TABLET | Freq: Once | ORAL | Status: AC
  Administered 2019-02-20: 1000 mg via ORAL
  Filled 2019-02-20: qty 2

## 2019-02-20 MED ORDER — FUROSEMIDE 20 MG PO TABS
160.0000 mg | ORAL_TABLET | Freq: Once | ORAL | Status: AC
  Administered 2019-02-20: 40 mg via ORAL
  Filled 2019-02-20: qty 8

## 2019-02-20 MED ORDER — BACITRACIN ZINC 500 UNIT/GM EX OINT
TOPICAL_OINTMENT | CUTANEOUS | Status: AC
  Filled 2019-02-20: qty 0.9

## 2019-02-20 MED ORDER — LIDOCAINE (ANORECTAL) 5 % EX CREA: TOPICAL_CREAM | CUTANEOUS | 0 refills | Status: AC

## 2019-02-20 MED ORDER — FUROSEMIDE 20 MG PO TABS
80.0000 mg | ORAL_TABLET | Freq: Once | ORAL | Status: AC
  Administered 2019-02-20: 80 mg via ORAL
  Filled 2019-02-20: qty 4

## 2019-02-20 NOTE — ED Triage Notes (Signed)
Pt bib ems for SOB and "groin issues".

## 2019-02-20 NOTE — ED Notes (Signed)
[place] EDP at bedside with pt. Pt began yelling during exam. Security still at bedside.

## 2019-02-20 NOTE — ED Notes (Signed)
Patient verbalizes understanding of discharge instructions. Opportunity for questioning and answers were provided. Armband removed by staff, pt discharged from ED.  

## 2019-02-20 NOTE — ED Notes (Signed)
Bacitracin and wound care provided to wound on pts penis.   Discharge instructions were reviewed with pt, pt verbalized understanding of discharge medications. When informed of discharge, pt yelled, "I want Tylenol." Pt was informed tylenol was not prescribed this visit. Security and off-duty [employer] assisted pt into wheelchair. Pt was taken to bus stop.

## 2019-02-20 NOTE — Discharge Instructions (Signed)
Take your Lasix as prescribed. Stop using cocaine.

## 2019-02-20 NOTE — ED Triage Notes (Signed)
Pt BIB [facility] from [facility]. Pt c/o groin pain, requesting detox resources.   Pt is becoming loud in the ED, asking NT to get out of her face. Pt states. "I did not pay all this insurance money to be put out." Security at bedside.

## 2019-02-20 NOTE — ED Notes (Signed)
O2 sats on 2.5 lpm [place] while sleeping: 100%  RA O2 sats 95-97%   RA O2 sats continued to drop while pt was sleeping and snoring to 88-89%. Pt noted to have a pause in breathing during this drop in RA O2 sats. Pt has a hx of sleep apnea. He was woken up and his O2 sats increased to 94% while on RA and while talking.   He was placed on 1 lpm via [place] after this assessment. Pt continued to sleep.

## 2019-02-20 NOTE — ED Notes (Signed)
ED Provider at bedside. 

## 2019-02-20 NOTE — ED Triage Notes (Signed)
Pt arrives via EMS c/o leg tightness and groin pain.

## 2019-02-20 NOTE — ED Notes (Signed)
D/c instructions reviewed with pt. [place] given

## 2019-02-20 NOTE — ED Provider Notes (Signed)
[facility] DEPT Provider Note: Georgena Spurling, MD, FACEP  CSN: [id number] MRN: [id number] ARRIVAL: 02/20/19 at 0412 ROOM: WA24/WA24   CHIEF COMPLAINT  Groin Pain   HISTORY OF PRESENT ILLNESS  02/20/19 4:32 AM [name] is a 58 y.o. male with a history of schizophrenia, polysubstance abuse, chronic anasarca and verbally abusive behavior.  This is his 115 visit to a [facility] ED in the past 6 months, most recently yesterday.  He [place] EMS from a gas station.  He is complaining of swelling to his abdomen, penis, scrotum and lower extremities which is a chronic problem for him.  He has an ulcer to the dorsal aspect of his penis which is causing him particular pain.  He rates the pain as a 9 out of 10, worse with movement or palpation.  He complains of difficulty walking due to the swelling in his legs but admits that he "is on his feet all day like a fool".  His penile lesion was dressed yesterday but the dressing has since fallen off.  He states he has to urinate frequently because he takes Lasix.  He is also complaining of shortness of breath but denies chest pain.  He admits to drinking alcohol recently.  Past Medical History:  Diagnosis Date  . Chronic foot pain   . Cocaine abuse ([place])   . Diabetes mellitus without complication ([place])   . Hepatitis C    unsure   . Homelessness   . Hypertension   . Neuropathy   . Polysubstance abuse ([place])   . Schizophrenia ([place])   . Sleep apnea   . Systolic and diastolic CHF, chronic ([place])     Past Surgical History:  Procedure Laterality Date  . MULTIPLE TOOTH EXTRACTIONS      Family History  Problem Relation Age of Onset  . Hypertension Other   . Diabetes Other     Social History   Tobacco Use  . Smoking status: Current Every Day Smoker    Packs/day: 1.00    Years: 20.00    Pack years: 20.00    Types: Cigarettes  . Smokeless tobacco: Current User  Substance Use Topics  . Alcohol use: Yes    Comment: Daily Drinker    . Drug use: Yes    Frequency: 7.0 times per week    Types: "Crack" cocaine, Cocaine, Marijuana    Comment: Crack    Prior to Admission medications   Medication Sig Start Date End Date Taking? Authorizing Provider  furosemide (LASIX) 40 MG tablet Take 1 tablet (40 mg total) by mouth 2 (two) times daily. 02/17/19 03/19/19  Nuala Alpha A, PA-C  glipiZIDE (GLUCOTROL) 5 MG tablet Take 0.5 tablets (2.5 mg total) by mouth 2 (two) times daily before a meal. 01/01/19   Rai, Ripudeep K, MD  Lidocaine, Anorectal, 5 % CREA Apply to sore on penis as needed for pain. 02/20/19   Jennett Tarbell, MD    Allergies Haldol [haloperidol]   REVIEW OF SYSTEMS  Negative except as noted here or in the History of Present Illness.   PHYSICAL EXAMINATION  Initial Vital Signs Blood pressure (!) 150/89, pulse (!) 102, temperature 97.9 F (36.6 C), resp. rate 20, SpO2 91 %.  Examination General: Well-developed, well-nourished male in no acute distress; appearance consistent with age of record HENT: normocephalic; atraumatic; breath smells of alcohol Eyes: pupils equal, round and reactive to light; extraocular muscles intact; arcus senilis bilaterally Neck: supple Heart: regular rate and rhythm Lungs: clear to  auscultation bilaterally Abdomen: Edema of abdominal wall, diffusely tender without erythema or warmth; bowel sounds present GU: Edema of penis and scrotum without erythema, warmth or crepitus; ulcer of dorsal skin of penis without signs of infection Extremities: No deformity; edema of lower extremities Neurologic: Awake, alert; dysarthria; ataxia; motor function intact in all extremities and symmetric; no facial droop Skin: Warm and dry Psychiatric: Flat affect   RESULTS  Summary of this visit's results, reviewed by myself:   EKG Interpretation  Date/Time:    Ventricular Rate:    PR Interval:    QRS Duration:   QT Interval:    QTC Calculation:   R Axis:     Text Interpretation:         Laboratory Studies: Results for orders placed or performed during the hospital encounter of 02/19/19 (from the past 24 hour(s))  Basic metabolic panel     Status: Abnormal   Collection Time: 02/19/19  6:30 PM  Result Value Ref Range   Sodium 138 135 - 145 mmol/L   Potassium 3.4 (L) 3.5 - 5.1 mmol/L   Chloride 97 (L) 98 - 111 mmol/L   CO2 28 22 - 32 mmol/L   Glucose, Bld 304 (H) 70 - 99 mg/dL   BUN 13 6 - 20 mg/dL   Creatinine, Ser 1.610.87 0.61 - 1.24 mg/dL   Calcium 8.7 (L) 8.9 - 10.3 mg/dL   GFR calc non Af Amer >60 >60 mL/min   GFR calc Af Amer >60 >60 mL/min   Anion gap 13 5 - 15  CBC with Differential     Status: Abnormal   Collection Time: 02/19/19  6:30 PM  Result Value Ref Range   WBC 7.6 4.0 - 10.5 K/uL   RBC 4.14 (L) 4.22 - 5.81 MIL/uL   Hemoglobin 8.7 (L) 13.0 - 17.0 g/dL   HCT 09.631.8 (L) 04.539.0 - 40.952.0 %   MCV 76.8 (L) 80.0 - 100.0 fL   MCH 21.0 (L) 26.0 - 34.0 pg   MCHC 27.4 (L) 30.0 - 36.0 g/dL   RDW 81.121.8 (H) 91.411.5 - 78.215.5 %   Platelets 358 150 - 400 K/uL   nRBC 0.3 (H) 0.0 - 0.2 %   Neutrophils Relative % 78 %   Neutro Abs 5.9 1.7 - 7.7 K/uL   Lymphocytes Relative 11 %   Lymphs Abs 0.8 0.7 - 4.0 K/uL   Monocytes Relative 8 %   Monocytes Absolute 0.6 0.1 - 1.0 K/uL   Eosinophils Relative 0 %   Eosinophils Absolute 0.0 0.0 - 0.5 K/uL   Basophils Relative 3 %   Basophils Absolute 0.2 (H) 0.0 - 0.1 K/uL   nRBC 0 0 /100 WBC   Abs Immature Granulocytes 0.00 0.00 - 0.07 K/uL   Polychromasia PRESENT   Brain natriuretic peptide     Status: Abnormal   Collection Time: 02/19/19  6:30 PM  Result Value Ref Range   B Natriuretic Peptide 1,064.4 (H) 0.0 - 100.0 pg/mL   Imaging Studies: Dg Chest Port 1 View  Result Date: 02/19/2019 CLINICAL DATA:  Shortness of breath. EXAM: PORTABLE CHEST 1 VIEW COMPARISON:  Multiple prior exams most recently 02/12/2019 FINDINGS: Chronic cardiomegaly. Unchanged mediastinal contours. Left midlung scarring is unchanged. Similar appearance of  retrocardiac opacity prior exams. Vascular congestion without pulmonary edema. No pneumothorax or acute airspace disease. IMPRESSION: Unchanged radiographic appearance of the chest with stable cardiomegaly and left midlung scarring. Electronically Signed   By: Narda RutherfordMelanie  Sanford M.D.   On: 02/19/2019 22:49  ED COURSE and MDM  Nursing notes and initial vitals signs, including pulse oximetry, reviewed.  Vitals:   02/20/19 0425  BP: (!) 150/89  Pulse: (!) 102  Resp: 20  Temp: 97.9 F (36.6 C)  SpO2: 91%   Chest x-ray is not significantly changed from yesterday.  Physical exam is not significantly changed from yesterday.  Patient has been verbally abusive with staff in violation of his care plan.  I do not believe any additional intervention is indicated at this time except for treatment of the wound on his penis.  PROCEDURES    ED DIAGNOSES     ICD-10-CM   1. Anasarca  R60.1   2. Homelessness  Z59.0   3. 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 Alcoholic intoxication without complication (HCC)  F10.920        Glynna Failla, Jonny RuizJohn, MD 02/20/19 [phone]

## 2019-02-20 NOTE — ED Provider Notes (Signed)
[facility] EMERGENCY DEPARTMENT Provider Note   CSN: [id number] Arrival date & time: 02/20/19  1408     History   Chief Complaint Chief Complaint  Patient presents with  . Shortness of Breath  . Groin Pain    HPI [name] is a 58 y.o. male with history of cocaine abuse, homelessness, CHF, diabetes, and schizophrenia presenting to the ED complaining of shortness of breath, lower extremity edema, scrotal edema, and chronic penile ulcer.  Patient reports his symptoms have been present for approximately 6 months and have not changed significantly today.  He reports his edema and neuropathy make walking painful and he is on his feet all day because he is homeless.  He reports to crack cocaine use earlier today.  Of note, patient has 115 ED visits in our system in the past 6 months for the same complaints.  Patient has left AGAINST MEDICAL ADVICE from his last 10 consecutive admissions within our hospital system.     The history is provided by the patient.    Past Medical History:  Diagnosis Date  . Chronic foot pain   . Cocaine abuse ([place])   . Diabetes mellitus without complication ([place])   . Hepatitis C    unsure   . Homelessness   . Hypertension   . Neuropathy   . Polysubstance abuse ([place])   . Schizophrenia ([place])   . Sleep apnea   . Systolic and diastolic CHF, chronic [facility])     Patient Active Problem List   Diagnosis Date Noted  . CHF (congestive heart failure), NYHA class III, acute on chronic, combined ([place]) 02/02/2019  . Scrotal edema 01/30/2019  . Combined systolic and diastolic heart failure ([place]) 01/19/2019  . Chest pain 01/16/2019  . 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  . Medically noncompliant   . Acute on chronic systolic CHF (congestive heart failure) ([place]) 01/02/2019  . Acute on chronic combined systolic (congestive) and diastolic (congestive) heart failure ([place]) 01/02/2019  . Acute on chronic systolic heart failure ([place]) 12/26/2018  . [place]  type II atrioventricular block 12/25/2018  . Acute exacerbation of CHF (congestive heart failure) ([place]) 12/22/2018  . Acute on chronic respiratory failure with hypoxia ([place]) 12/22/2018  . CHF exacerbation ([place]) 12/19/2018  . Acute CHF (congestive heart failure) ([place]) 12/16/2018  . Diabetes mellitus without complication ([place]) 81/27/5170  . Acute on chronic systolic (congestive) heart failure ([place]) 12/11/2018  . Abdominal pain 12/11/2018  . Nausea vomiting and diarrhea 12/11/2018  . Microcytic anemia 12/11/2018  . Evaluation by psychiatric service required   . MDD (major depressive disorder), severe ([place]) 11/25/2018  . Pressure injury of skin 11/08/2018  . Elevated troponin 10/18/2018  . HLD (hyperlipidemia) 10/18/2018  . Anxiety 10/18/2018  . CHF (congestive heart failure), NYHA class II, acute on chronic, combined ([place]) 10/18/2018  . Hypertensive urgency 10/12/2018  . Mild renal insufficiency 10/12/2018  . Cellulitis 10/12/2018  . Penile cellulitis   . Adjustment disorder with mixed disturbance of emotions and conduct   . Sleep apnea 10/03/2018  . Hypokalemia 09/17/2018  . Scrotal swelling 09/17/2018  . Urinary hesitancy   . Constipation   . Acute on chronic combined systolic and diastolic CHF (congestive heart failure) ([place]) 08/25/2018  . Acute respiratory failure with hypoxia ([place]) 08/25/2018  . Tobacco use 08/18/2018  . Homelessness 08/08/2018  . Smoker 08/08/2018  . Prostate enlargement 03/16/2018  . Aortic atherosclerosis ([place]) 03/16/2018  . 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 Cocaine abuse with  cocaine-induced mood disorder (HCC) 09/18/2017  . Chronic foot pain   . Schizoaffective disorder, bipolar type (HCC) 09/30/2016  . Substance induced mood disorder (HCC) 03/13/2015  . Schizophrenia, paranoid type (HCC) 01/17/2015  . Suicidal ideation   . Drug hallucinosis (HCC) 10/08/2014  . Chronic paranoid schizophrenia (HCC) 09/07/2014  . Substance or  medication-induced bipolar and related disorder with onset during intoxication (HCC) 08/10/2014  . Urinary retention   . Cocaine use disorder, severe, dependence (HCC)   . Essential hypertension 03/28/2013  . 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 MULTIPLE TOOTH EXTRACTIONS          Home Medications    Prior to Admission medications   Medication Sig Start Date End Date Taking? Authorizing Provider  furosemide (LASIX) 40 MG tablet Take 1 tablet (40 mg total) by mouth 2 (two) times daily. 02/17/19 03/19/19  Harlene SaltsMorelli, Brandon A, PA-C  glipiZIDE (GLUCOTROL) 5 MG tablet Take 0.5 tablets (2.5 mg total) by mouth 2 (two) times daily before a meal. 01/01/19   Rai, Ripudeep K, MD  Lidocaine, Anorectal, 5 % CREA Apply to sore on penis as needed for pain. 02/20/19   Molpus, John, MD    Family History Family History  Problem Relation Age of Onset  . Hypertension Other   . Diabetes Other     Social History Social History   Tobacco Use  . Smoking status: Current Every Day Smoker    Packs/day: 1.00    Years: 20.00    Pack years: 20.00    Types: Cigarettes  . Smokeless tobacco: Current User  Substance Use Topics  . Alcohol use: Yes    Comment: Daily Drinker   . Drug use: Yes    Frequency: 7.0 times per week    Types: "Crack" cocaine, Cocaine, Marijuana    Comment: Crack     Allergies   Haldol [haloperidol]   Review of Systems Review of Systems  Constitutional: Negative for chills and fever.  HENT: Negative for ear pain and sore throat.   Eyes: Negative for pain and visual disturbance.  Respiratory: Positive for shortness of breath. Negative for cough.   Cardiovascular: Positive for leg swelling. Negative for chest pain and palpitations.  Gastrointestinal: Negative for abdominal pain and vomiting.  Genitourinary: Positive for penile pain. Negative for dysuria and hematuria.   Musculoskeletal: Negative for arthralgias and back pain.  Skin: Negative for color change and rash.  Neurological: Negative for seizures and syncope.  All other systems reviewed and are negative.    Physical Exam Updated Vital Signs BP (!) 142/100 (BP Location: Right Arm)   Pulse (!) 109   Temp 98.4 F (36.9 C) (Oral)   Resp (!) 24   SpO2 100%   Physical Exam Vitals signs and nursing note reviewed.  Constitutional:      General: He is not in acute distress.    Appearance: He is obese. He is not ill-appearing, toxic-appearing or diaphoretic.  HENT:     Head: Normocephalic and atraumatic.     Nose: Nose normal. No congestion or rhinorrhea.     Mouth/Throat:     Mouth: Mucous membranes are moist.     Pharynx: Oropharynx is clear. No oropharyngeal exudate or posterior oropharyngeal erythema.  Eyes:     Extraocular Movements: Extraocular movements intact.     Conjunctiva/sclera: Conjunctivae normal.     Pupils: Pupils are equal, round, and reactive to light.  Neck:     Musculoskeletal: Normal range of motion and neck supple. No neck rigidity or muscular tenderness.  Cardiovascular:     Rate and Rhythm: Regular rhythm. Tachycardia present.     Pulses: Normal pulses.     Heart sounds: Normal heart sounds. No murmur. No friction rub. No gallop.   Pulmonary:     Effort: Pulmonary effort is normal. No respiratory distress.     Breath sounds: Normal breath sounds. No stridor. No wheezing or rhonchi.  Abdominal:     General: Abdomen is flat.     Palpations: Abdomen is soft.     Tenderness: There is no abdominal tenderness. There is no guarding or rebound.  Genitourinary:    Comments: Scrotum and penis are markedly swollen.  There is an ulcer to the dorsum of the penis without sign of superimposed infection. Musculoskeletal: Normal range of motion.        General: No tenderness, deformity or signs of injury.     Right lower leg: Edema present.     Left lower leg: Edema present.   Skin:    General: Skin is warm and dry.  Neurological:     General: No focal deficit present.     Mental Status: He is alert and oriented to person, place, and time. Mental status is at baseline.     Cranial Nerves: No cranial nerve deficit.     Sensory: No sensory deficit.     Motor: No weakness.  Psychiatric:        Mood and Affect: Mood is anxious.        Behavior: Behavior is hyperactive.      ED Treatments / Results  Labs (all labs ordered are listed, but only abnormal results are displayed) Labs Reviewed - No data to display  EKG None  [facility]  Result Date: 02/19/2019 CLINICAL DATA:  Shortness of breath. EXAM: PORTABLE CHEST 1 VIEW COMPARISON:  Multiple prior exams most recently 02/12/2019 FINDINGS: Chronic cardiomegaly. Unchanged mediastinal contours. Left midlung scarring is unchanged. Similar appearance of retrocardiac opacity prior exams. Vascular congestion without pulmonary edema. No pneumothorax or acute airspace disease. IMPRESSION: Unchanged radiographic appearance of the chest with stable cardiomegaly and left midlung scarring. Electronically Signed   By: Narda RutherfordMelanie  Sanford M.D.   On: 02/19/2019 22:49    Procedures Procedures (including critical care time)  Medications Ordered in ED Medications  furosemide (LASIX) tablet 80 mg (80 mg Oral Given 02/20/19 1638)     Initial Impression / Assessment and Plan / ED Course  I have reviewed the triage vital signs and the nursing notes.  Pertinent labs & imaging results that were available during my care of the patient were reviewed by me and considered in my medical decision making ([name] chart for details).        [name] is a 58 y.o. male with history of cocaine abuse, homelessness, CHF, diabetes, and schizophrenia presenting to the ED complaining of shortness of breath, lower extremity edema, scrotal edema, and chronic penile ulcer.  Patient denies that his symptoms are any  worse than his baseline.  Patient's O2 saturation is in the mid 90s on room air while awake.  He was given a dose of Lasix 80 mg PO while in the ED and was given Tylenol for his pain.  Patient just had laboratory studies and a chest x-ray yesterday which were unremarkable.  Patient reports his symptoms have not significantly changed and there is no  need to repeat his work-up at this time.  Patient has no indications for admission at this time.  He has adequate oxygen saturations on room air.  He was discharged from the ED in stable condition.  Final Clinical Impressions(s) / ED Diagnoses   Final diagnoses:  Anasarca  Shortness of breath    ED Discharge Orders    None       Garry HeaterEames, Arling Cerone, MD 02/21/19 [place] [id number]    Miller, Brian, MD 02/21/19 [phone]

## 2019-02-20 NOTE — ED Provider Notes (Signed)
Emergency Department Provider Note   I have reviewed the triage vital signs and the nursing notes.   HISTORY  Chief Complaint Leg Pain   HPI [name] is a 58 y.o. male who presents the emergency department today for at least the fifth time in the last 2 days and for his 116th time in 6 months.  Patient often times has exact same symptoms that he has right now of leg pain, leg swelling and sometimes shortness of breath.  He has some borderline low oxygen is in the past and remains that way now.  He states that his legs are so sore that he "fell out".  He did not syncopized.  He did not hit his head.  He did not suffer any musculoskeletal injuries with his "fall out".  He does not Solik actual loss consciousness and sounds like it was pain induced weakness.  He has no other symptoms at this time.  He immediately tells me he wants to go to the lobby however I discussed with him that this was his medical exam.  He stated he was upset that he did not get Tylenol or a list of shelters last time he was in the emergency room.   No other associated or modifying symptoms.    Past Medical History:  Diagnosis Date  . Chronic foot pain   . Cocaine abuse (HCC)   . Diabetes mellitus without complication (HCC)   . Hepatitis C    unsure   . Homelessness   . Hypertension   . Neuropathy   . Polysubstance abuse (HCC)   . Schizophrenia (HCC)   . Sleep apnea   . Systolic and diastolic CHF, chronic [facility](HCC)     Patient Active Problem List   Diagnosis Date Noted  . CHF (congestive heart failure), NYHA class III, acute on chronic, combined (HCC) 02/02/2019  . Scrotal edema 01/30/2019  . Combined systolic and diastolic heart failure (HCC) 01/19/2019  . Chest pain 01/16/2019  . 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  . Medically noncompliant   . Acute on chronic systolic CHF (congestive heart failure) (HCC) 01/02/2019  . Acute on chronic combined systolic (congestive) and diastolic (congestive) heart  failure (HCC) 01/02/2019  . Acute on chronic systolic heart failure (HCC) 12/26/2018  . Mobitz type II atrioventricular block 12/25/2018  . Acute exacerbation of CHF (congestive heart failure) (HCC) 12/22/2018  . Acute on chronic respiratory failure with hypoxia (HCC) 12/22/2018  . CHF exacerbation (HCC) 12/19/2018  . Acute CHF (congestive heart failure) (HCC) 12/16/2018  . Diabetes mellitus without complication (HCC) 12/11/2018  . Acute on chronic systolic (congestive) heart failure (HCC) 12/11/2018  . Abdominal pain 12/11/2018  . Nausea vomiting and diarrhea 12/11/2018  . Microcytic anemia 12/11/2018  . Evaluation by psychiatric service required   . MDD (major depressive disorder), severe (HCC) 11/25/2018  . Pressure injury of skin 11/08/2018  . Elevated troponin 10/18/2018  . HLD (hyperlipidemia) 10/18/2018  . Anxiety 10/18/2018  . CHF (congestive heart failure), NYHA class II, acute on chronic, combined (HCC) 10/18/2018  . Hypertensive urgency 10/12/2018  . Mild renal insufficiency 10/12/2018  . Cellulitis 10/12/2018  . Penile cellulitis   . Adjustment disorder with mixed disturbance of emotions and conduct   . Sleep apnea 10/03/2018  . Hypokalemia 09/17/2018  . Scrotal swelling 09/17/2018  . Urinary hesitancy   . Constipation   . Acute on chronic combined systolic and diastolic CHF (congestive heart failure) (HCC) 08/25/2018  . Acute respiratory failure with hypoxia (  HCC) 08/25/2018  . Tobacco use 08/18/2018  . Homelessness 08/08/2018  . Smoker 08/08/2018  . Prostate enlargement 03/16/2018  . Aortic atherosclerosis (HCC) 03/16/2018  . 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 Cocaine abuse with cocaine-induced mood disorder (HCC) 09/18/2017  . Chronic foot pain   . Schizoaffective disorder, bipolar type (HCC) 09/30/2016  . Substance induced mood disorder (HCC) 03/13/2015  . Schizophrenia, paranoid type (HCC) 01/17/2015  . Suicidal ideation   . Drug hallucinosis  (HCC) 10/08/2014  . Chronic paranoid schizophrenia (HCC) 09/07/2014  . 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 Urinary retention   . Cocaine use disorder, severe, dependence (HCC)   . Essential hypertension 03/28/2013  . 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 MULTIPLE TOOTH EXTRACTIONS      Current Outpatient Rx  . Order #: [id number] Class: Print  . Order #: [id number] Class: Print  . Order #: [id number] Class: Print    Allergies Haldol [haloperidol]  Family History  Problem Relation Age of Onset  . Hypertension Other   . Diabetes Other     Social History Social History   Tobacco Use  . Smoking status: Current Every Day Smoker    Packs/day: 1.00    Years: 20.00    Pack years: 20.00    Types: Cigarettes  . Smokeless tobacco: Current User  Substance Use Topics  . Alcohol use: Yes    Comment: Daily Drinker   . Drug use: Yes    Frequency: 7.0 times per week    Types: "Crack" cocaine, Cocaine, Marijuana    Comment: Crack    Review of Systems  All other systems negative except as documented in the HPI. All pertinent positives and negatives as reviewed in the HPI. ____________________________________________   PHYSICAL EXAM:  VITAL SIGNS: ED Triage Vitals  Enc Vitals Group     BP 02/20/19 2328 (!) 149/89     Pulse Rate 02/20/19 2328 (!) 116     Resp 02/20/19 2328 (!) 24     Temp 02/20/19 2328 98.7 F (37.1 C)     Temp Source 02/20/19 2328 Oral     SpO2 02/20/19 2328 91 %    Constitutional: Alert and oriented. Well appearing and in no acute distress. Eyes: Conjunctivae are normal. PERRL. EOMI. Head: Atraumatic. Nose: No congestion/rhinnorhea. Mouth/Throat: Mucous membranes are moist.  Oropharynx non-erythematous. Neck: No stridor.  No meningeal signs.   Cardiovascular: tachycardic rate, regular  rhythm. Good peripheral circulation. Grossly normal heart sounds.   Respiratory: Normal respiratory effort.  No retractions. Lungs with mild crackles/wheezing but able to raise his voice and speak in long full sentences. Gastrointestinal: Soft and nontender. No distention.  Musculoskeletal: BLE edema. No ttp or gross deformities of extremities. Neurologic:  Normal speech and language. No gross focal neurologic deficits are appreciated.  Skin:  Skin is warm, dry and intact. No rash noted.   ____________________________________________    INITIAL IMPRESSION / ASSESSMENT AND PLAN / ED COURSE  Here with similar complaints as past. Suspect malingering. He ordered tylenol and a list of shelters, both of which I provided. I also gave a dose of Lasix. His VS are similar to last few days, no resp distress. Labs drawn yesterday and similar to baseline. Stable for discharge at this time.   Pertinent labs & imaging results that were available during my care of the patient were reviewed by me  and considered in my medical decision making (see chart for details).   A medical screening exam was performed and I feel the patient has had an appropriate workup for their chief complaint at this time and likelihood of emergent condition existing is low. They have been counseled on decision, discharge, follow up and which symptoms necessitate immediate return to the emergency department. They or their family verbally stated understanding and agreement with plan and discharged in stable condition.   ____________________________________________  FINAL CLINICAL IMPRESSION(S) / ED DIAGNOSES  Final diagnoses:  Left leg pain  Right leg pain  Edema, unspecified type     MEDICATIONS GIVEN DURING THIS VISIT:  Medications  acetaminophen (TYLENOL) tablet 1,000 mg (1,000 mg Oral Given 02/20/19 2344)  furosemide (LASIX) tablet 160 mg (40 mg Oral Given 02/20/19 2343)     NEW OUTPATIENT MEDICATIONS STARTED DURING THIS  VISIT:  Discharge Medication List as of 02/20/2019 11:34 PM      Note:  This note was prepared with assistance of Dragon voice recognition software. Occasional wrong-word or sound-a-like substitutions may have occurred due to the inherent limitations of voice recognition software.   Callan Yontz, Corene Cornea, MD 02/21/19 [phone]

## 2019-02-20 NOTE — ED Notes (Signed)
While pt being triaged by Joellen Jersey, RN, pt began getting verbally aggressive with staff. Pt yelled "I didn't pay for no ambulance ride to be brought in and be kicked out". Writer told pt he was not "being kicked out". Pt then began cussing and yelling at [employer]. Pt asked to stop cussing/yelling at staff. Security at bedside. Pt still yelling at writer "get out of my damn face!!". Security spoke with pt and pt stopped yelling.

## 2019-02-21 ENCOUNTER — Emergency Department (HOSPITAL_COMMUNITY): Payer: Medicare Other

## 2019-02-21 ENCOUNTER — Other Ambulatory Visit: Payer: Self-pay

## 2019-02-21 ENCOUNTER — Inpatient Hospital Stay (HOSPITAL_COMMUNITY)
Admission: EM | Admit: 2019-02-21 | Discharge: 2019-02-22 | DRG: 291 | Discharge status: Against Medical Advice | Payer: Medicare Other | Attending: Student | Admitting: Student

## 2019-02-21 ENCOUNTER — Encounter (HOSPITAL_COMMUNITY): Payer: Self-pay

## 2019-02-21 DIAGNOSIS — Z5329 Procedure and treatment not carried out because of patient's decision for other reasons: Secondary | ICD-10-CM | POA: Diagnosis present

## 2019-02-21 DIAGNOSIS — I4430 Unspecified atrioventricular block: Secondary | ICD-10-CM | POA: Diagnosis present

## 2019-02-21 DIAGNOSIS — G4733 Obstructive sleep apnea (adult) (pediatric): Secondary | ICD-10-CM | POA: Diagnosis present

## 2019-02-21 DIAGNOSIS — N400 Enlarged prostate without lower urinary tract symptoms: Secondary | ICD-10-CM | POA: Diagnosis present

## 2019-02-21 DIAGNOSIS — G8929 Other chronic pain: Secondary | ICD-10-CM | POA: Diagnosis present

## 2019-02-21 DIAGNOSIS — R601 Generalized edema: Secondary | ICD-10-CM | POA: Diagnosis present

## 2019-02-21 DIAGNOSIS — I5043 Acute on chronic combined systolic (congestive) and diastolic (congestive) heart failure: Secondary | ICD-10-CM | POA: Diagnosis present

## 2019-02-21 DIAGNOSIS — E1140 Type 2 diabetes mellitus with diabetic neuropathy, unspecified: Secondary | ICD-10-CM | POA: Diagnosis present

## 2019-02-21 DIAGNOSIS — N5089 Other specified disorders of the male genital organs: Secondary | ICD-10-CM | POA: Diagnosis present

## 2019-02-21 DIAGNOSIS — I110 Hypertensive heart disease with heart failure: Secondary | ICD-10-CM | POA: Diagnosis present

## 2019-02-21 DIAGNOSIS — Z5900 Homelessness unspecified: Secondary | ICD-10-CM

## 2019-02-21 DIAGNOSIS — F200 Paranoid schizophrenia: Secondary | ICD-10-CM | POA: Diagnosis present

## 2019-02-21 DIAGNOSIS — E1165 Type 2 diabetes mellitus with hyperglycemia: Secondary | ICD-10-CM | POA: Diagnosis present

## 2019-02-21 DIAGNOSIS — Z9114 Patient's other noncompliance with medication regimen: Secondary | ICD-10-CM

## 2019-02-21 DIAGNOSIS — T383X6A Underdosing of insulin and oral hypoglycemic [antidiabetic] drugs, initial encounter: Secondary | ICD-10-CM | POA: Diagnosis present

## 2019-02-21 DIAGNOSIS — E119 Type 2 diabetes mellitus without complications: Secondary | ICD-10-CM | POA: Diagnosis not present

## 2019-02-21 DIAGNOSIS — I509 Heart failure, unspecified: Secondary | ICD-10-CM

## 2019-02-21 DIAGNOSIS — I5082 Biventricular heart failure: Secondary | ICD-10-CM | POA: Diagnosis present

## 2019-02-21 DIAGNOSIS — D509 Iron deficiency anemia, unspecified: Secondary | ICD-10-CM | POA: Diagnosis present

## 2019-02-21 DIAGNOSIS — Z833 Family history of diabetes mellitus: Secondary | ICD-10-CM

## 2019-02-21 DIAGNOSIS — Z91120 Patient's intentional underdosing of medication regimen due to financial hardship: Secondary | ICD-10-CM

## 2019-02-21 DIAGNOSIS — Z20828 Contact with and (suspected) exposure to other viral communicable diseases: Secondary | ICD-10-CM | POA: Diagnosis present

## 2019-02-21 DIAGNOSIS — Z9119 Patient's noncompliance with other medical treatment and regimen: Secondary | ICD-10-CM | POA: Diagnosis not present

## 2019-02-21 DIAGNOSIS — F17210 Nicotine dependence, cigarettes, uncomplicated: Secondary | ICD-10-CM | POA: Diagnosis present

## 2019-02-21 DIAGNOSIS — J9601 Acute respiratory failure with hypoxia: Secondary | ICD-10-CM | POA: Diagnosis present

## 2019-02-21 DIAGNOSIS — I5040 Unspecified combined systolic (congestive) and diastolic (congestive) heart failure: Secondary | ICD-10-CM | POA: Diagnosis not present

## 2019-02-21 DIAGNOSIS — Z888 Allergy status to other drugs, medicaments and biological substances status: Secondary | ICD-10-CM

## 2019-02-21 DIAGNOSIS — Z6839 Body mass index (BMI) 39.0-39.9, adult: Secondary | ICD-10-CM | POA: Diagnosis not present

## 2019-02-21 DIAGNOSIS — Z5321 Procedure and treatment not carried out due to patient leaving prior to being seen by health care provider: Secondary | ICD-10-CM | POA: Diagnosis not present

## 2019-02-21 DIAGNOSIS — I10 Essential (primary) hypertension: Secondary | ICD-10-CM | POA: Diagnosis present

## 2019-02-21 DIAGNOSIS — Z8249 Family history of ischemic heart disease and other diseases of the circulatory system: Secondary | ICD-10-CM

## 2019-02-21 DIAGNOSIS — E785 Hyperlipidemia, unspecified: Secondary | ICD-10-CM | POA: Diagnosis present

## 2019-02-21 DIAGNOSIS — M79673 Pain in unspecified foot: Secondary | ICD-10-CM | POA: Diagnosis present

## 2019-02-21 DIAGNOSIS — F1420 Cocaine dependence, uncomplicated: Secondary | ICD-10-CM | POA: Diagnosis present

## 2019-02-21 DIAGNOSIS — F14288 Cocaine dependence with other cocaine-induced disorder: Secondary | ICD-10-CM | POA: Diagnosis not present

## 2019-02-21 DIAGNOSIS — Z7984 Long term (current) use of oral hypoglycemic drugs: Secondary | ICD-10-CM | POA: Diagnosis not present

## 2019-02-21 DIAGNOSIS — K08409 Partial loss of teeth, unspecified cause, unspecified class: Secondary | ICD-10-CM | POA: Diagnosis present

## 2019-02-21 DIAGNOSIS — Z79899 Other long term (current) drug therapy: Secondary | ICD-10-CM

## 2019-02-21 DIAGNOSIS — R0902 Hypoxemia: Secondary | ICD-10-CM

## 2019-02-21 LAB — CBC WITH DIFFERENTIAL/PLATELET
Abs Immature Granulocytes: 0.5 10*3/uL — ABNORMAL HIGH (ref 0.00–0.07)
Basophils Absolute: 0.1 10*3/uL (ref 0.0–0.1)
Basophils Relative: 1 %
Eosinophils Absolute: 0.2 10*3/uL (ref 0.0–0.5)
Eosinophils Relative: 2 %
HCT: 32.5 % — ABNORMAL LOW (ref 39.0–52.0)
Hemoglobin: 8.8 g/dL — ABNORMAL LOW (ref 13.0–17.0)
Lymphocytes Relative: 10 %
Lymphs Abs: 0.8 10*3/uL (ref 0.7–4.0)
MCH: 20.8 pg — ABNORMAL LOW (ref 26.0–34.0)
MCHC: 27.1 g/dL — ABNORMAL LOW (ref 30.0–36.0)
MCV: 76.7 fL — ABNORMAL LOW (ref 80.0–100.0)
Metamyelocytes Relative: 6 %
Monocytes Absolute: 0.2 10*3/uL (ref 0.1–1.0)
Monocytes Relative: 2 %
Neutro Abs: 6.6 10*3/uL (ref 1.7–7.7)
Neutrophils Relative %: 79 %
Platelets: 385 10*3/uL (ref 150–400)
RBC: 4.24 MIL/uL (ref 4.22–5.81)
RDW: 21.6 % — ABNORMAL HIGH (ref 11.5–15.5)
WBC: 8.3 10*3/uL (ref 4.0–10.5)
nRBC: 0 /100 WBC
nRBC: 0.2 % (ref 0.0–0.2)

## 2019-02-21 LAB — BASIC METABOLIC PANEL
Anion gap: 13 (ref 5–15)
BUN: 11 mg/dL (ref 6–20)
CO2: 28 mmol/L (ref 22–32)
Calcium: 8.9 mg/dL (ref 8.9–10.3)
Chloride: 96 mmol/L — ABNORMAL LOW (ref 98–111)
Creatinine, Ser: 0.88 mg/dL (ref 0.61–1.24)
GFR calc Af Amer: >60 mL/min (ref >60)
GFR calc non Af Amer: >60 mL/min (ref >60)
Glucose, Bld: 337 mg/dL — ABNORMAL HIGH (ref 70–99)
Potassium: 3.6 mmol/L (ref 3.5–5.1)
Sodium: 137 mmol/L (ref 135–145)

## 2019-02-21 LAB — MAGNESIUM: Magnesium: 1.8 mg/dL (ref 1.7–2.4)

## 2019-02-21 LAB — GLUCOSE, CAPILLARY: Glucose-Capillary: 348 mg/dL — ABNORMAL HIGH (ref 70–99)

## 2019-02-21 LAB — MRSA PCR SCREENING: MRSA by PCR: NEGATIVE

## 2019-02-21 LAB — BRAIN NATRIURETIC PEPTIDE: B Natriuretic Peptide: 905.3 pg/mL — ABNORMAL HIGH (ref 0.0–100.0)

## 2019-02-21 MED ORDER — ENOXAPARIN SODIUM 40 MG/0.4ML ~~LOC~~ SOLN: 40.0000 mg | SUBCUTANEOUS | Status: DC

## 2019-02-21 MED ORDER — ACETAMINOPHEN 325 MG PO TABS
650.0000 mg | ORAL_TABLET | Freq: Four times a day (QID) | ORAL | Status: DC | PRN
  Administered 2019-02-21 – 2019-02-22 (×2): 650 mg via ORAL
  Filled 2019-02-21 (×2): qty 2

## 2019-02-21 MED ORDER — SODIUM CHLORIDE 0.9 % IV SOLN: 250.0000 mL | INTRAVENOUS | Status: DC | PRN

## 2019-02-21 MED ORDER — HYDRALAZINE HCL 20 MG/ML IJ SOLN
10.0000 mg | Freq: Four times a day (QID) | INTRAMUSCULAR | Status: DC | PRN
  Administered 2019-02-21: 10 mg via INTRAVENOUS
  Filled 2019-02-21: qty 1

## 2019-02-21 MED ORDER — ENOXAPARIN SODIUM 60 MG/0.6ML ~~LOC~~ SOLN
60.0000 mg | SUBCUTANEOUS | Status: DC
  Administered 2019-02-21: 60 mg via SUBCUTANEOUS
  Filled 2019-02-21: qty 0.6

## 2019-02-21 MED ORDER — INSULIN ASPART 100 UNIT/ML ~~LOC~~ SOLN
0.0000 [IU] | Freq: Three times a day (TID) | SUBCUTANEOUS | Status: DC
  Administered 2019-02-22: 3 [IU] via SUBCUTANEOUS
  Administered 2019-02-22 (×2): 5 [IU] via SUBCUTANEOUS

## 2019-02-21 MED ORDER — SODIUM CHLORIDE 0.9% FLUSH
3.0000 mL | Freq: Two times a day (BID) | INTRAVENOUS | Status: DC
  Administered 2019-02-21 – 2019-02-22 (×2): 3 mL via INTRAVENOUS

## 2019-02-21 MED ORDER — SODIUM CHLORIDE 0.9% FLUSH: 3.0000 mL | INTRAVENOUS | Status: DC | PRN

## 2019-02-21 MED ORDER — FUROSEMIDE 20 MG PO TABS
40.0000 mg | ORAL_TABLET | Freq: Once | ORAL | Status: AC
  Administered 2019-02-21: 14:00:00 40 mg via ORAL
  Filled 2019-02-21: qty 2

## 2019-02-21 MED ORDER — INSULIN ASPART 100 UNIT/ML ~~LOC~~ SOLN
0.0000 [IU] | Freq: Every day | SUBCUTANEOUS | Status: DC
  Administered 2019-02-21: 4 [IU] via SUBCUTANEOUS

## 2019-02-21 NOTE — ED Triage Notes (Signed)
Pt arrives POV with c/o of enlarged prostate and groin pain that he can't tolerate.

## 2019-02-21 NOTE — H&P (Signed)
History and Physical    [name] WEX:[id number] DOB: 04-Mar-1961 DOA: 02/21/2019  PCP: Patient, No Pcp Per  Patient coming from: the street (homeless)   Chief Complaint: shortness of breath and swelling  HPI: [name] is a 58 y.o. male with medical history significant for homelessness, schizophrenia, [name], DM uncontrolled, obesity, who presents w/ above.  Multiple ED visits and hospitalizations. Most recently admitted earlier this month for chf exacerbation, left AMA the next day.  Presents today complaining of swelling in legs and scrotum and shortness of breath. He denies fever, chest pain, nausea/vomiting, dysuria. Denies melena/hematochezia. Denies recent drug use. Says he has not been compliant with his medications or outpatient f/u.   ED Course: labs, lasix, CXR  Review of Systems: As per HPI otherwise 10 point review of systems negative.    Past Medical History:  Diagnosis Date  . Chronic foot pain   . Cocaine abuse ([place])   . Diabetes mellitus without complication ([place])   . Hepatitis C    unsure   . Homelessness   . Hypertension   . Neuropathy   . Polysubstance abuse ([place])   . Schizophrenia ([place])   . Sleep apnea   . Systolic and diastolic CHF, chronic ([place])     Past Surgical History:  Procedure Laterality Date  . MULTIPLE TOOTH EXTRACTIONS       reports that he has been smoking cigarettes. He has a 20.00 pack-year smoking history. He uses smokeless tobacco. He reports current alcohol use. He reports current drug use. Frequency: 7.00 times per week. Drugs: "Crack" cocaine, Cocaine, and Marijuana.  Allergies  Allergen Reactions  . Haldol [Haloperidol] Other (See Comments)    Muscle spasms, loss of voluntary movement. However, pt has taken Thorazine on multiple occasions with no adverse effects.     Family History  Problem Relation Age of Onset  . Hypertension Other   . Diabetes Other     Prior to Admission medications   Medication  Sig Start Date End Date Taking? Authorizing Provider  furosemide (LASIX) 40 MG tablet Take 1 tablet (40 mg total) by mouth 2 (two) times daily. 02/17/19 03/19/19  Nuala Alpha A, PA-C  glipiZIDE (GLUCOTROL) 5 MG tablet Take 0.5 tablets (2.5 mg total) by mouth 2 (two) times daily before a meal. 01/01/19   Rai, Ripudeep K, MD  Lidocaine, Anorectal, 5 % CREA Apply to sore on penis as needed for pain. 02/20/19   Molpus, Jenny Reichmann, MD    Physical Exam: Vitals:   02/21/19 1700 02/21/19 1715 02/21/19 1741 02/21/19 1815  BP: (!) 144/102 (!) 151/97  (!) 157/105  Pulse:  (!) 106  (!) 102  Resp: (!) 24 (!) 21  17  Temp:      TempSrc:      SpO2:  (!) 89% 90% 96%  Weight:      Height:        Constitutional: No acute distress Head: Atraumatic Eyes: Conjunctiva clear ENM: Moist mucous membranes. poordentition.  Neck: Supple Respiratory: Clear to auscultation bilaterally save for rales at bases, respiratory effort. No accessory muscle use. . Cardiovascular: distant heart sounds, systolic murmur, rr Abdomen: obese, distended, non tender Musculoskeletal: No joint deformity upper and lower extremities. Normal ROM, no contractures. Normal muscle tone.  Skin: induration and edema lower extremities, scrotum swollen. Calluses on feet Neurologic: Alert, moving all 4 extremities. Psychiatric: short answers to questions.    Labs on Admission: I have personally reviewed following labs and imaging studies  CBC:  Recent Labs  Lab 02/19/19 1830 02/21/19 1438  WBC 7.6 8.3  NEUTROABS 5.9 6.6  HGB 8.7* 8.8*  HCT 31.8* 32.5*  MCV 76.8* 76.7*  PLT 358 385   Basic Metabolic Panel: Recent Labs  Lab 02/19/19 1830 02/21/19 1438  NA 138 137  K 3.4* 3.6  CL 97* 96*  CO2 28 28  GLUCOSE 304* 337*  BUN 13 11  CREATININE 0.87 0.88  CALCIUM 8.7* 8.9  MG  --  1.8   GFR: Estimated Creatinine Clearance: 112.3 mL/min (by C-G formula based on SCr of 0.88 mg/dL). Liver Function Tests: No results for input(s):  AST, ALT, ALKPHOS, BILITOT, PROT, ALBUMIN in the last 168 hours. No results for input(s): LIPASE, AMYLASE in the last 168 hours. No results for input(s): AMMONIA in the last 168 hours. Coagulation Profile: No results for input(s): INR, PROTIME in the last 168 hours. Cardiac Enzymes: No results for input(s): CKTOTAL, CKMB, CKMBINDEX, TROPONINI in the last 168 hours. BNP (last 3 results) No results for input(s): PROBNP in the last 8760 hours. HbA1C: No results for input(s): HGBA1C in the last 72 hours. CBG: No results for input(s): GLUCAP in the last 168 hours. Lipid Profile: No results for input(s): CHOL, HDL, LDLCALC, TRIG, CHOLHDL, LDLDIRECT in the last 72 hours. Thyroid Function Tests: No results for input(s): TSH, T4TOTAL, FREET4, T3FREE, THYROIDAB in the last 72 hours. Anemia Panel: No results for input(s): VITAMINB12, FOLATE, FERRITIN, TIBC, IRON, RETICCTPCT in the last 72 hours. Urine analysis:    Component Value Date/Time   COLORURINE YELLOW 02/13/2019 0408   APPEARANCEUR CLEAR 02/13/2019 0408   LABSPEC 1.019 02/13/2019 0408   PHURINE 5.0 02/13/2019 0408   GLUCOSEU 50 (A) 02/13/2019 0408   HGBUR NEGATIVE 02/13/2019 0408   BILIRUBINUR NEGATIVE 02/13/2019 0408   KETONESUR NEGATIVE 02/13/2019 0408   PROTEINUR 100 (A) 02/13/2019 0408   UROBILINOGEN 1.0 03/14/2015 0610   NITRITE NEGATIVE 02/13/2019 0408   LEUKOCYTESUR NEGATIVE 02/13/2019 0408    Radiological Exams on Admission: Dg Chest Port 1 View  Result Date: 02/21/2019 CLINICAL DATA:  Enlarged prostate, groin pain.  Shortness of breath EXAM: PORTABLE CHEST 1 VIEW COMPARISON:  02/19/2019 FINDINGS: Cardiomegaly with vascular congestion. Interstitial prominence is increased since prior study concerning for interstitial edema. Lingular atelectasis. No visible effusions or acute bony abnormality. IMPRESSION: Cardiomegaly with vascular congestion and mild suspected interstitial edema. Lingular atelectasis. Electronically Signed    By: Charlett NoseKevin  Dover M.D.   On: 02/21/2019 15:55   Dg Chest Port 1 View  Result Date: 02/19/2019 CLINICAL DATA:  Shortness of breath. EXAM: PORTABLE CHEST 1 VIEW COMPARISON:  Multiple prior exams most recently 02/12/2019 FINDINGS: Chronic cardiomegaly. Unchanged mediastinal contours. Left midlung scarring is unchanged. Similar appearance of retrocardiac opacity prior exams. Vascular congestion without pulmonary edema. No pneumothorax or acute airspace disease. IMPRESSION: Unchanged radiographic appearance of the chest with stable cardiomegaly and left midlung scarring. Electronically Signed   By: Narda RutherfordMelanie  Sanford M.D.   On: 02/19/2019 22:49    EKG: pending  Assessment/Plan Active Problems:   Type 2 diabetes mellitus (HCC)   Essential hypertension   Cocaine use disorder, severe, dependence (HCC)   Chronic paranoid schizophrenia (HCC)   Homelessness   Acute on chronic combined systolic and diastolic CHF (congestive heart failure) (HCC)   Microcytic anemia   CHF exacerbation (HCC)   # Acute CHF exacerbation with hypoxic respiratory failure - EF 25-30 on TTE earlier this year. BNP elevated, sig edema on exam, and vascular congestion on CXR.  Breathing comfortably sitting up on 3 L [place]. Hypoxic on ambulation to 70s and on room air sitting to mid 80s. Frequently leaves AMA, says wants admission today. Denies chest pain. No fevers or cxr findings to suggest covid. - s/p lasix 40 mg IV in ED, continue bid - ins and outs - [place] O2, wean as able - low sodium diet  # Type 2 diabetes - glucose elevated, not compliant w/ home meds - SSI to start  # HTN - bp moderately elevated to 150s, not on home medications, likely 2/2 noncompliance - consider re-start prior to d/c  # microcytic anemia - iron studies earlier this year showing iron deficiency. h 8.8 today is stable from prior - stool occult blood ordered  DVT prophylaxis: lovenox Code Status: full (hx dnr but pt says wants to be full code this  admission)  Family Communication: cousin  Disposition Plan: tbd  Consults called: none  Admission status: tele    Silvano BilisNoah B Jaymen Fetch MD [facility] Pager [phone]  If 7PM-7AM, please contact night-coverage [email or web address] Password [email or web address]  02/21/2019, 6:35 PM

## 2019-02-21 NOTE — ED Notes (Signed)
Per phsycian's order, ambulated with patient in hallway with pulse ox monitoring. Patient's oxygen saturation decreased to 76% on Room air while ambulating. Increased to 85% with 1 liter of 02. Patient noted to have increased dyspnea and diaphoresis as well while ambulating. Patient also had increased swelling on Left Side of neck.   Patient back to bed at this time and placed on 3L of oxygen.  Communicated findings to Dr. Melina Copa.

## 2019-02-21 NOTE — ED Provider Notes (Signed)
[facility] EMERGENCY DEPARTMENT Provider Note   CSN: [id number] Arrival date & time: 02/21/19  1229     History   Chief Complaint Chief Complaint  Patient presents with  . Benign Prostatic Hypertrophy    HPI [name] is a 58 y.o. male.  He has a history of homelessness and frequent visits to the emergency department and has an active care plan in place.  He was last here about 24 hours ago.  He is complaining of swelling throughout his abdomen and groin and legs and he says he cannot take it anymore.  He has chronic shortness of breath that he says is no worse than usual.  It is unclear if he has been taking his medicines.  He last had lab work 2 days ago that was fairly baseline for him.  He is asking to be admitted for placement.  On review of his prior admissions he signed out AMA fairly quickly from all of them.  No chest pain no fever.     The history is provided by the patient.  Leg Pain Location:  Leg Injury: no   Leg location:  L leg and R leg Pain details:    Quality:  Aching   Severity:  Severe   Onset quality:  Unable to specify   Timing:  Constant   Progression:  Worsening Relieved by:  Nothing Worsened by:  Activity Ineffective treatments:  None tried Associated symptoms: back pain, decreased ROM, stiffness and swelling   Associated symptoms: no fever     Past Medical History:  Diagnosis Date  . Chronic foot pain   . Cocaine abuse ([place])   . Diabetes mellitus without complication ([place])   . Hepatitis C    unsure   . Homelessness   . Hypertension   . Neuropathy   . Polysubstance abuse ([place])   . Schizophrenia ([place])   . Sleep apnea   . Systolic and diastolic CHF, chronic [facility])     Patient Active Problem List   Diagnosis Date Noted  . CHF (congestive heart failure), NYHA class III, acute on chronic, combined ([place]) 02/02/2019  . Scrotal edema 01/30/2019  . Combined systolic and diastolic heart failure ([place]) 01/19/2019  .  Chest pain 01/16/2019  . 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  . Medically noncompliant   . Acute on chronic systolic CHF (congestive heart failure) ([place]) 01/02/2019  . Acute on chronic combined systolic (congestive) and diastolic (congestive) heart failure ([place]) 01/02/2019  . Acute on chronic systolic heart failure ([place]) 12/26/2018  . Mobitz type II atrioventricular block 12/25/2018  . Acute exacerbation of CHF (congestive heart failure) ([place]) 12/22/2018  . Acute on chronic respiratory failure with hypoxia ([place]) 12/22/2018  . CHF exacerbation ([place]) 12/19/2018  . Acute CHF (congestive heart failure) ([place]) 12/16/2018  . Diabetes mellitus without complication ([place]) 14/43/1540  . Acute on chronic systolic (congestive) heart failure ([place]) 12/11/2018  . Abdominal pain 12/11/2018  . Nausea vomiting and diarrhea 12/11/2018  . Microcytic anemia 12/11/2018  . Evaluation by psychiatric service required   . MDD (major depressive disorder), severe ([place]) 11/25/2018  . Pressure injury of skin 11/08/2018  . Elevated troponin 10/18/2018  . HLD (hyperlipidemia) 10/18/2018  . Anxiety 10/18/2018  . CHF (congestive heart failure), NYHA class II, acute on chronic, combined ([place]) 10/18/2018  . Hypertensive urgency 10/12/2018  . Mild renal insufficiency 10/12/2018  . Cellulitis 10/12/2018  . Penile cellulitis   . Adjustment disorder with mixed disturbance of emotions and conduct   .  Sleep apnea 10/03/2018  . Hypokalemia 09/17/2018  . Scrotal swelling 09/17/2018  . Urinary hesitancy   . Constipation   . Acute on chronic combined systolic and diastolic CHF (congestive heart failure) ([place]) 08/25/2018  . Acute respiratory failure with hypoxia ([place]) 08/25/2018  . Tobacco use 08/18/2018  . Homelessness 08/08/2018  . Smoker 08/08/2018  . Prostate enlargement 03/16/2018  . Aortic atherosclerosis ([place]) 03/16/2018  . 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 Cocaine abuse with cocaine-induced mood disorder ([place])  09/18/2017  . Chronic foot pain   . Schizoaffective disorder, bipolar type ([place]) 09/30/2016  . Substance induced mood disorder ([place]) 03/13/2015  . Schizophrenia, paranoid type ([place]) 01/17/2015  . Suicidal ideation   . Drug hallucinosis ([place]) 10/08/2014  . Chronic paranoid schizophrenia ([place]) 09/07/2014  . 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 Urinary retention   . Cocaine use disorder, severe, dependence ([place])   . Essential hypertension 03/28/2013  . Insulin-requiring or dependent type II diabetes mellitus ([place]) 03/15/2013    Past Surgical History:  Procedure Laterality Date  . MULTIPLE TOOTH EXTRACTIONS          Home Medications    Prior to Admission medications   Medication Sig Start Date End Date Taking? Authorizing Provider  furosemide (LASIX) 40 MG tablet Take 1 tablet (40 mg total) by mouth 2 (two) times daily. 02/17/19 03/19/19  Nuala Alpha A, PA-C  glipiZIDE (GLUCOTROL) 5 MG tablet Take 0.5 tablets (2.5 mg total) by mouth 2 (two) times daily before a meal. 01/01/19   Rai, Ripudeep K, MD  Lidocaine, Anorectal, 5 % CREA Apply to sore on penis as needed for pain. 02/20/19   Molpus, John, MD    Family History Family History  Problem Relation Age of Onset  . Hypertension Other   . Diabetes Other     Social History Social History   Tobacco Use  . Smoking status: Current Every Day Smoker    Packs/day: 1.00    Years: 20.00    Pack years: 20.00    Types: Cigarettes  . Smokeless tobacco: Current User  Substance Use Topics  . Alcohol use: Yes    Comment: Daily Drinker   . Drug use: Yes    Frequency: 7.0 times per week    Types: "Crack" cocaine, Cocaine, Marijuana    Comment: Crack     Allergies   Haldol [haloperidol]   Review of Systems Review of Systems  Constitutional: Negative for fever.  HENT: Negative for sore throat.   Eyes: Negative for visual disturbance.  Respiratory:  Positive for shortness of breath.   Cardiovascular: Positive for leg swelling. Negative for chest pain.  Gastrointestinal: Negative for abdominal pain.  Genitourinary: Positive for penile pain, penile swelling and scrotal swelling. Negative for dysuria.  Musculoskeletal: Positive for back pain and stiffness.  Skin: Negative for rash.  Neurological: Negative for headaches.     Physical Exam Updated Vital Signs BP (!) 186/122 (BP Location: Right Arm)   Pulse (!) 107   Temp 98.6 F (37 C) (Oral)   Resp 20   Ht 5' 9" (1.753 m)   Wt 111 kg   SpO2 100%   BMI 36.14 kg/m   Physical Exam Vitals signs and nursing note reviewed.  Constitutional:      Appearance: He is well-developed.  HENT:     Head: Normocephalic and atraumatic.  Eyes:     Conjunctiva/sclera: Conjunctivae normal.  Neck:  Musculoskeletal: Neck supple.  Cardiovascular:     Rate and Rhythm: Regular rhythm. Tachycardia present.     Heart sounds: No murmur.  Pulmonary:     Effort: Pulmonary effort is normal. No respiratory distress.     Breath sounds: Wheezing (few scattered) present.  Abdominal:     Tenderness: There is no abdominal tenderness.     Comments: Patient has firm swelling throughout his legs scrotum and penis up to his mid abdomen.  Genitourinary:    Comments: He has diffuse swelling through his penis and scrotum. Musculoskeletal:        General: Swelling and tenderness present.     Right lower leg: Edema present.     Left lower leg: Edema present.  Skin:    General: Skin is warm and dry.  Neurological:     General: No focal deficit present.     Mental Status: He is alert.      ED Treatments / Results  Labs (all labs ordered are listed, but only abnormal results are displayed) Labs Reviewed  BASIC METABOLIC PANEL - Abnormal; Notable for the following components:      Result Value   Chloride 96 (*)    Glucose, Bld 337 (*)    All other components within normal limits  CBC WITH  DIFFERENTIAL/PLATELET - Abnormal; Notable for the following components:   Hemoglobin 8.8 (*)    HCT 32.5 (*)    MCV 76.7 (*)    MCH 20.8 (*)    MCHC 27.1 (*)    RDW 21.6 (*)    Abs Immature Granulocytes 0.50 (*)    All other components within normal limits  BRAIN NATRIURETIC PEPTIDE - Abnormal; Notable for the following components:   B Natriuretic Peptide 905.3 (*)    All other components within normal limits  BASIC METABOLIC PANEL - Abnormal; Notable for the following components:   Glucose, Bld 237 (*)    All other components within normal limits  CBC - Abnormal; Notable for the following components:   RBC 4.10 (*)    Hemoglobin 8.5 (*)    HCT 31.2 (*)    MCV 76.1 (*)    MCH 20.7 (*)    MCHC 27.2 (*)    RDW 21.2 (*)    nRBC 0.5 (*)    All other components within normal limits  RAPID URINE DRUG SCREEN, HOSP PERFORMED - Abnormal; Notable for the following components:   Cocaine POSITIVE (*)    All other components within normal limits  GLUCOSE, CAPILLARY - Abnormal; Notable for the following components:   Glucose-Capillary 348 (*)    All other components within normal limits  GLUCOSE, CAPILLARY - Abnormal; Notable for the following components:   Glucose-Capillary 219 (*)    All other components within normal limits  MRSA PCR SCREENING  NOVEL CORONAVIRUS, NAA (HOSPITAL ORDER, SEND-OUT TO REF LAB)  MAGNESIUM  OCCULT BLOOD X 1 CARD TO LAB, STOOL  VITAMIN B12  FOLATE  IRON AND TIBC  FERRITIN  RETICULOCYTES    EKG EKG Interpretation  Date/Time:  Sunday February 21 2019 15:54:31 EDT Ventricular Rate:  105 PR Interval:    QRS Duration: 86 QT Interval:  326 QTC Calculation: 431 R Axis:   68 Text Interpretation:  Sinus tachycardia Atrial premature complexes Anterior infarct, old similar to prior 8/20 Confirmed by Aletta Edouard [phone]) on 02/21/2019 3:56:59 PM   Radiology Dg Chest Port 1 View  Result Date: 02/19/2019 CLINICAL DATA:  Shortness of breath. EXAM: PORTABLE  CHEST 1 VIEW COMPARISON:  Multiple prior exams most recently 02/12/2019 FINDINGS: Chronic cardiomegaly. Unchanged mediastinal contours. Left midlung scarring is unchanged. Similar appearance of retrocardiac opacity prior exams. Vascular congestion without pulmonary edema. No pneumothorax or acute airspace disease. IMPRESSION: Unchanged radiographic appearance of the chest with stable cardiomegaly and left midlung scarring. Electronically Signed   By: Keith Rake M.D.   On: 02/19/2019 22:49    Procedures Procedures (including critical care time)  Medications Ordered in ED Medications  furosemide (LASIX) tablet 40 mg (has no administration in time range)     Initial Impression / Assessment and Plan / ED Course  I have reviewed the triage vital signs and the nursing notes.  Pertinent labs & imaging results that were available during my care of the patient were reviewed by me and considered in my medical decision making (see chart for details).  Clinical Course as of Feb 22 851  Nancy Fetter Feb 21, 2019  1402 I reviewed the prior visits this patient.  It sounds like he has a long history of noncompliance and even when he gets admitted he signed an AMA due to his demands not being met.  He is homeless and refuses to take his Lasix and comes here for continued edema.  He had labs 2 days ago that did not show any significant abnormalities.  I forwarded his dose of Lasix here and will get him some update if he chooses.  Ultimately I do not see any medical necessity for admission and social work is not available to help him with placement today.  It looks like he has been offered resources in the past and is refused to do anything about it.  Ultimately he is a chronically sick gentleman who is not taking care of himself.    [MB]  1423 Attempt to take the patient off his oxygen and he desatted to 82%.  Have given him his oral Lasix.  Checking some screening labs and a chest x-ray.   [MB]  1501 Patient's  hemoglobin low but better than his baseline.   [MB]  0301 BNP is elevated but about his normal range.  Chest x-ray similar cardiomegaly with some signs of vascular congestion.  Lab work shows an elevated glucose similar to priors.  He is anemic at his baseline. Will ambulate with pusleox and see if still requiring oxygen.    [MB]  1736 We ambulated the patient in the department he desatted into the 70s.  Have paged the hospitalist for admission.   [MB]  1822 I asked the patient if he stays in the hospital if he is going to comply with the recommendations of the doctors and the staff and he said he would be in compliance.  I talked to [facility] hospitalist who will evaluate the patient for admission.   [MB]    Clinical Course User Index [MB] Hayden Rasmussen, MD       Final Clinical Impressions(s) / ED Diagnoses   Final diagnoses:  Acute on chronic combined systolic and diastolic CHF (congestive heart failure) ([place])  Hypoxia  Anasarca    ED Discharge Orders    None       Hayden Rasmussen, MD 02/22/19 [phone]

## 2019-02-22 ENCOUNTER — Encounter (HOSPITAL_COMMUNITY): Payer: Self-pay | Admitting: Emergency Medicine

## 2019-02-22 ENCOUNTER — Emergency Department (HOSPITAL_COMMUNITY)
Admission: EM | Admit: 2019-02-22 | Discharge: 2019-02-23 | Disposition: A | Discharge status: Home / Self Care | Payer: Medicaid Other | Attending: Emergency Medicine | Admitting: Emergency Medicine

## 2019-02-22 ENCOUNTER — Other Ambulatory Visit: Payer: Self-pay

## 2019-02-22 DIAGNOSIS — F17210 Nicotine dependence, cigarettes, uncomplicated: Secondary | ICD-10-CM | POA: Insufficient documentation

## 2019-02-22 DIAGNOSIS — I10 Essential (primary) hypertension: Secondary | ICD-10-CM

## 2019-02-22 DIAGNOSIS — Z7984 Long term (current) use of oral hypoglycemic drugs: Secondary | ICD-10-CM | POA: Insufficient documentation

## 2019-02-22 DIAGNOSIS — E119 Type 2 diabetes mellitus without complications: Secondary | ICD-10-CM

## 2019-02-22 DIAGNOSIS — Z789 Other specified health status: Secondary | ICD-10-CM

## 2019-02-22 DIAGNOSIS — F14288 Cocaine dependence with other cocaine-induced disorder: Secondary | ICD-10-CM

## 2019-02-22 DIAGNOSIS — D509 Iron deficiency anemia, unspecified: Secondary | ICD-10-CM

## 2019-02-22 DIAGNOSIS — N5089 Other specified disorders of the male genital organs: Secondary | ICD-10-CM | POA: Diagnosis not present

## 2019-02-22 DIAGNOSIS — F1420 Cocaine dependence, uncomplicated: Secondary | ICD-10-CM

## 2019-02-22 DIAGNOSIS — F209 Schizophrenia, unspecified: Secondary | ICD-10-CM

## 2019-02-22 DIAGNOSIS — Z590 Homelessness: Secondary | ICD-10-CM

## 2019-02-22 DIAGNOSIS — I5040 Unspecified combined systolic (congestive) and diastolic (congestive) heart failure: Secondary | ICD-10-CM | POA: Insufficient documentation

## 2019-02-22 DIAGNOSIS — G8929 Other chronic pain: Secondary | ICD-10-CM | POA: Insufficient documentation

## 2019-02-22 DIAGNOSIS — E1140 Type 2 diabetes mellitus with diabetic neuropathy, unspecified: Secondary | ICD-10-CM | POA: Insufficient documentation

## 2019-02-22 DIAGNOSIS — I5043 Acute on chronic combined systolic (congestive) and diastolic (congestive) heart failure: Secondary | ICD-10-CM

## 2019-02-22 DIAGNOSIS — J9621 Acute and chronic respiratory failure with hypoxia: Secondary | ICD-10-CM

## 2019-02-22 DIAGNOSIS — F200 Paranoid schizophrenia: Secondary | ICD-10-CM

## 2019-02-22 DIAGNOSIS — I110 Hypertensive heart disease with heart failure: Secondary | ICD-10-CM | POA: Insufficient documentation

## 2019-02-22 DIAGNOSIS — Z5321 Procedure and treatment not carried out due to patient leaving prior to being seen by health care provider: Secondary | ICD-10-CM | POA: Insufficient documentation

## 2019-02-22 LAB — CBC
HCT: 31.2 % — ABNORMAL LOW (ref 39.0–52.0)
Hemoglobin: 8.5 g/dL — ABNORMAL LOW (ref 13.0–17.0)
MCH: 20.7 pg — ABNORMAL LOW (ref 26.0–34.0)
MCHC: 27.2 g/dL — ABNORMAL LOW (ref 30.0–36.0)
MCV: 76.1 fL — ABNORMAL LOW (ref 80.0–100.0)
Platelets: 339 10*3/uL (ref 150–400)
RBC: 4.1 MIL/uL — ABNORMAL LOW (ref 4.22–5.81)
RDW: 21.2 % — ABNORMAL HIGH (ref 11.5–15.5)
WBC: 6.1 10*3/uL (ref 4.0–10.5)
nRBC: 0.5 % — ABNORMAL HIGH (ref 0.0–0.2)

## 2019-02-22 LAB — RAPID URINE DRUG SCREEN, HOSP PERFORMED
Amphetamines: NOT DETECTED
Barbiturates: NOT DETECTED
Benzodiazepines: NOT DETECTED
Cocaine: POSITIVE — AB
Opiates: NOT DETECTED
Tetrahydrocannabinol: NOT DETECTED

## 2019-02-22 LAB — BASIC METABOLIC PANEL
Anion gap: 8 (ref 5–15)
BUN: 9 mg/dL (ref 6–20)
CO2: 32 mmol/L (ref 22–32)
Calcium: 9 mg/dL (ref 8.9–10.3)
Chloride: 99 mmol/L (ref 98–111)
Creatinine, Ser: 0.8 mg/dL (ref 0.61–1.24)
GFR calc Af Amer: >60 mL/min (ref >60)
GFR calc non Af Amer: >60 mL/min (ref >60)
Glucose, Bld: 237 mg/dL — ABNORMAL HIGH (ref 70–99)
Potassium: 3.9 mmol/L (ref 3.5–5.1)
Sodium: 139 mmol/L (ref 135–145)

## 2019-02-22 LAB — FOLATE: Folate: 15.3 ng/mL (ref >5.9)

## 2019-02-22 LAB — GLUCOSE, CAPILLARY
Glucose-Capillary: 219 mg/dL — ABNORMAL HIGH (ref 70–99)
Glucose-Capillary: 250 mg/dL — ABNORMAL HIGH (ref 70–99)
Glucose-Capillary: 239 mg/dL — ABNORMAL HIGH (ref 70–99)

## 2019-02-22 LAB — FERRITIN: Ferritin: 13 ng/mL — ABNORMAL LOW (ref 24–336)

## 2019-02-22 LAB — RETICULOCYTES
Immature Retic Fract: 22.7 % — ABNORMAL HIGH (ref 2.3–15.9)
RBC.: 4.1 MIL/uL — ABNORMAL LOW (ref 4.22–5.81)
Retic Count, Absolute: 57.8 10*3/uL (ref 19.0–186.0)
Retic Ct Pct: 1.4 % (ref 0.4–3.1)

## 2019-02-22 LAB — IRON AND TIBC
Iron: 14 ug/dL — ABNORMAL LOW (ref 45–182)
Saturation Ratios: 3 % — ABNORMAL LOW (ref 17.9–39.5)
TIBC: 479 ug/dL — ABNORMAL HIGH (ref 250–450)
UIBC: 465 ug/dL

## 2019-02-22 LAB — VITAMIN B12: Vitamin B-12: 614 pg/mL (ref 180–914)

## 2019-02-22 MED ORDER — INSULIN GLARGINE 100 UNIT/ML ~~LOC~~ SOLN
5.0000 [IU] | Freq: Every day | SUBCUTANEOUS | Status: DC
  Administered 2019-02-22: 5 [IU] via SUBCUTANEOUS
  Filled 2019-02-22: qty 0.05

## 2019-02-22 MED ORDER — LISINOPRIL 10 MG PO TABS
10.0000 mg | ORAL_TABLET | Freq: Every day | ORAL | Status: DC
  Administered 2019-02-22: 10 mg via ORAL
  Filled 2019-02-22: qty 1

## 2019-02-22 MED ORDER — POTASSIUM CHLORIDE CRYS ER 20 MEQ PO TBCR
20.0000 meq | EXTENDED_RELEASE_TABLET | Freq: Every day | ORAL | Status: DC
  Administered 2019-02-22: 20 meq via ORAL
  Filled 2019-02-22: qty 1

## 2019-02-22 MED ORDER — INSULIN GLARGINE 100 UNIT/ML ~~LOC~~ SOLN
10.0000 [IU] | Freq: Two times a day (BID) | SUBCUTANEOUS | Status: DC
  Filled 2019-02-22: qty 0.1

## 2019-02-22 MED ORDER — FUROSEMIDE 40 MG PO TABS
40.0000 mg | ORAL_TABLET | Freq: Three times a day (TID) | ORAL | Status: DC
  Administered 2019-02-22: 16:00:00 40 mg via ORAL
  Filled 2019-02-22: qty 1

## 2019-02-22 MED ORDER — TRAMADOL HCL 50 MG PO TABS
50.0000 mg | ORAL_TABLET | Freq: Four times a day (QID) | ORAL | Status: DC | PRN
  Administered 2019-02-22: 50 mg via ORAL
  Filled 2019-02-22 (×2): qty 1

## 2019-02-22 MED ORDER — FUROSEMIDE 10 MG/ML IJ SOLN: 40.0000 mg | Freq: Three times a day (TID) | INTRAMUSCULAR | Status: DC

## 2019-02-22 MED ORDER — FUROSEMIDE 40 MG PO TABS: 40.0000 mg | ORAL_TABLET | Freq: Two times a day (BID) | ORAL | Status: DC

## 2019-02-22 NOTE — Progress Notes (Signed)
Inpatient Diabetes Program Recommendations  AACE/ADA: New Consensus Statement on Inpatient Glycemic Control (2015)  Target Ranges:  Prepandial:   less than 140 mg/dL      Peak postprandial:   less than 180 mg/dL (1-2 hours)      Critically ill patients:  140 - 180 mg/dL   Results for [name], [name] (MRN [id number]) as of 02/22/2019 10:21  Ref. Range 02/21/2019 22:05 02/22/2019 06:37  Glucose-Capillary Latest Ref Range: 70 - 99 mg/dL 348 (H)  4 units NOVOLOG  219 (H)  3 units NOVOLOG       Admit CHF Flare  History: DM, Homelessness, Schizophrenia, CHF, Polysubstance Abuse   Home DM Meds: Glipizide 2.5 mg BID   Current Orders: Lantus 5 units daily      Novolog Moderate Correction Scale/ SSI (0-15 units) TID AC + HS      Novolog SSI started last PM.   Lantus to start this AM.   Last A1c was 9.7% back in June. Has Psych issues and severe Drug Issues. Has been counseled in the past and told DM Coordinator he was going to go back to the streets and use drugs again.   Per Record Review, This is pt's 30th admission since January and pt has been to the ED 105 times since January.  Glucose control as an outpatient will be difficult given social issues.     --Will follow patient during hospitalization--  Wyn Quaker RN, MSN, CDE Diabetes Coordinator Inpatient Glycemic Control Team Team Pager: [phone] (8a-5p)

## 2019-02-22 NOTE — Progress Notes (Signed)
PROGRESS NOTE  [name] HLK:[id number] DOB: 1961-06-03   PCP: Patient, No Pcp Per  Patient is from: Homeless  DOA: 02/21/2019 LOS: 1  Brief Narrative / Interim history: 58 y.o. male with medical history significant for homelessness, schizophrenia, [name], [name], obesity and medication noncompliance presenting with shortness of breath and swelling.  BNP elevated.  CXR consistent with CHF.  Admitted for CHF exacerbation on IV Lasix.  Assessment & Plan: Acute respiratory failure with hypoxia: Likely due to CHF exacerbation.  Desaturated to 70s on room air with ambulation.  Improving with diuresis.   -Wean oxygen as able  -Manage CHF as below.  Acute on chronic combined CHF: Echo in 5/20 with EF 25-30, moderate LVH, DD, diffuse hypokinesis, moderate LAE, severe RAE, and RVSP to 51 mmHg.  Patient with significant edema including scrotal edema, vascular congestion on CXR and elevated BNP.  UDS positive for cocaine.  Had about 1 L urine output.  Correction is stable. -Increase IV Lasix to 40 mg 3 times daily. -Daily weight, intake output and renal function -Salt and fluid restriction -Scrotal sling -Cardiology consulted for guidance.  [name]-2 with hyperglycemia: A1c 9.7 and 6/20.  Not compliant w/ home meds.  On glipizide 2.5 mg twice daily which is not sufficient. -Add Lantus at 30 units twice daily -Continue SSI -Start statin.  HTN -BP elevated. -Increase Lasix as above -We will initiate GDMT for his CHF  Microcytic anemia: Hgb 8.5 which seems to baseline. -Stool occult blood ordered -Anemia panel  History of schizophrenia: He states he has been on Abilify and Tegretol in the past.  I do not see this on his home medication.  Previously left AMA multiple times.  -Consulted psych for management and capacity assessment.  Polysubstance use disorder: UDS positive for cocaine. -Counseled. -Avoid beta-blockers.  Homelessness/noncompliance/frequent  hospitalizations/ED visit: -CSW consulted.  Morbid obesity: BMI 39. -Difficult case given his psych, social and other comorbid issues.   DVT prophylaxis: Subcu Lovenox Code Status: Full code Family Communication: Patient and/or RN. Available if any question.  Disposition Plan: Remains inpatient Consultants: Cardiology, psychiatry, CSW, diabetic coronary  Procedures:  None  Microbiology summarized: [id number] negative. MRSA PCR screen negative.  Antimicrobials: Anti-infectives (From admission, onward)   None      Sch Meds:  Scheduled Meds: . enoxaparin (LOVENOX) injection  60 mg Subcutaneous Q24H  . insulin aspart  0-15 Units Subcutaneous TID WC  . insulin aspart  0-5 Units Subcutaneous QHS  . insulin glargine  5 Units Subcutaneous Daily  . sodium chloride flush  3 mL Intravenous Q12H   Continuous Infusions: . sodium chloride     PRN Meds:.sodium chloride, acetaminophen, hydrALAZINE, sodium chloride flush, traMADol   Subjective: Complains about lower extremity and scrotal swelling, abdominal distention and shortness of breath.  He is poor historian.  He he becomes argumentative when he was told his symptoms could be due to fluid overload from his CHF. He states his symptoms are due to being on his feet for long time as a result of his mental health.  He denies chest pain.  He also complains generalized pain and asks for Tylenol.  His speech is somewhat difficult to understand.  Objective: Vitals:   02/22/19 0357 02/22/19 0745 02/22/19 0807 02/22/19 1115  BP: (!) 145/108 (!) 194/99 (!) 165/98 (!) 161/99  Pulse: (!) 103 97  95  Resp: 20 20  18   Temp: 98.8 F (37.1 C) 97.9 F (36.6 C)  (!) 97.4 F (36.3 C)  TempSrc: Oral Oral  Oral  SpO2: 97% 97%  100%  Weight:      Height:        Intake/Output Summary (Last 24 hours) at 02/22/2019 1311 Last data filed at 02/22/2019 1240 Gross per 24 hour  Intake 1260 ml  Output 850 ml  Net 410 ml   Filed Weights   02/21/19  1314 02/21/19 2042 02/22/19 0018  Weight: 111 kg 119.3 kg 119.8 kg    Examination:  GENERAL: No acute distress.  Sleepy but arises easily. HEENT: MMM.  Vision and hearing grossly intact.  NECK: Supple.  No apparent JVD but difficult exam.  RESP:  No IWOB.  Fair air movement bilaterally.  Bibasilar crackles. CVS:  RRR . Heart sounds normal.  ABD/GI/GU: Bowel sounds present.  Abdomen distended. MSK/EXT:  Moves extremities.  Nonpitting edema bilaterally.  2+ DP pulses bilaterally. SKIN: Dry skin over both lower extremities. NEURO: Awake, alert and oriented to self and place.  No gross neuro deficit. PSYCH: Easily upset when told his symptoms could be due to fluid overload.  No notable psychosis  I have personally reviewed the following labs and images: CBC: Recent Labs  Lab 02/19/19 1830 02/21/19 1438 02/22/19 0510  WBC 7.6 8.3 6.1  NEUTROABS 5.9 6.6  --   HGB 8.7* 8.8* 8.5*  HCT 31.8* 32.5* 31.2*  MCV 76.8* 76.7* 76.1*  PLT 358 385 339   BMP &GFR Recent Labs  Lab 02/19/19 1830 02/21/19 1438 02/22/19 0510  NA 138 137 139  K 3.4* 3.6 3.9  CL 97* 96* 99  CO2 28 28 32  GLUCOSE 304* 337* 237*  BUN 13 11 9   CREATININE 0.87 0.88 0.80  CALCIUM 8.7* 8.9 9.0  MG  --  1.8  --    Estimated Creatinine Clearance: 128.6 mL/min (by C-G formula based on SCr of 0.8 mg/dL). Liver & Pancreas: No results for input(s): AST, ALT, ALKPHOS, BILITOT, PROT, ALBUMIN in the last 168 hours. No results for input(s): LIPASE, AMYLASE in the last 168 hours. No results for input(s): AMMONIA in the last 168 hours. Diabetic: No results for input(s): HGBA1C in the last 72 hours. Recent Labs  Lab 02/21/19 2205 02/22/19 0637 02/22/19 1117  GLUCAP 348* 219* 250*   Cardiac Enzymes: No results for input(s): CKTOTAL, CKMB, CKMBINDEX, TROPONINI in the last 168 hours. No results for input(s): PROBNP in the last 8760 hours. Coagulation Profile: No results for input(s): INR, PROTIME in the last 168  hours. Thyroid Function Tests: No results for input(s): TSH, T4TOTAL, FREET4, T3FREE, THYROIDAB in the last 72 hours. Lipid Profile: No results for input(s): CHOL, HDL, LDLCALC, TRIG, CHOLHDL, LDLDIRECT in the last 72 hours. Anemia Panel: Recent Labs    02/22/19 1020  VITAMINB12 614  FOLATE 15.3  FERRITIN 13*  TIBC 479*  IRON 14*  RETICCTPCT 1.4   Urine analysis:    Component Value Date/Time   COLORURINE YELLOW 02/13/2019 0408   APPEARANCEUR CLEAR 02/13/2019 0408   LABSPEC 1.019 02/13/2019 0408   PHURINE 5.0 02/13/2019 0408   GLUCOSEU 50 (A) 02/13/2019 0408   HGBUR NEGATIVE 02/13/2019 0408   BILIRUBINUR NEGATIVE 02/13/2019 0408   KETONESUR NEGATIVE 02/13/2019 0408   PROTEINUR 100 (A) 02/13/2019 0408   UROBILINOGEN 1.0 03/14/2015 0610   NITRITE NEGATIVE 02/13/2019 0408   LEUKOCYTESUR NEGATIVE 02/13/2019 0408   Sepsis Labs: Invalid input(s): PROCALCITONIN, LACTICIDVEN  Microbiology: Recent Results (from the past 240 hour(s))  SARS Coronavirus 2 [facility](Hospital order, Performed in [facility] hospital lab)  Nasopharyngeal Nasopharyngeal Swab     Status: None   Collection Time: 02/12/19  4:51 PM   Specimen: Nasopharyngeal Swab  Result Value Ref Range Status   SARS Coronavirus 2 NEGATIVE NEGATIVE Final    Comment: (NOTE) If result is NEGATIVE SARS-CoV-2 target nucleic acids are NOT DETECTED. The SARS-CoV-2 RNA is generally detectable in upper and lower  respiratory specimens during the acute phase of infection. The lowest  concentration of SARS-CoV-2 viral copies this assay can detect is 250  copies / mL. A negative result does not preclude SARS-CoV-2 infection  and should not be used as the sole basis for treatment or other  patient management decisions.  A negative result may occur with  improper specimen collection / handling, submission of specimen other  than nasopharyngeal swab, presence of viral mutation(s) within the  areas targeted by this assay, and inadequate  number of viral copies  (<250 copies / mL). A negative result must be combined with clinical  observations, patient history, and epidemiological information. If result is POSITIVE SARS-CoV-2 target nucleic acids are DETECTED. The SARS-CoV-2 RNA is generally detectable in upper and lower  respiratory specimens dur ing the acute phase of infection.  Positive  results are indicative of active infection with SARS-CoV-2.  Clinical  correlation with patient history and other diagnostic information is  necessary to determine patient infection status.  Positive results do  not rule out bacterial infection or co-infection with other viruses. If result is PRESUMPTIVE POSTIVE SARS-CoV-2 nucleic acids MAY BE PRESENT.   A presumptive positive result was obtained on the submitted specimen  and confirmed on repeat testing.  While 2019 novel coronavirus  (SARS-CoV-2) nucleic acids may be present in the submitted sample  additional confirmatory testing may be necessary for epidemiological  and / or clinical management purposes  to differentiate between  SARS-CoV-2 and other Sarbecovirus currently known to infect humans.  If clinically indicated additional testing with an alternate test  methodology [phone]([id number]) is advised. The SARS-CoV-2 RNA is generally  detectable in upper and lower respiratory sp ecimens during the acute  phase of infection. The expected result is Negative. Fact Sheet for Patients:  BoilerBrush.com.cyhttps://www.fda.gov/media/136312/download Fact Sheet for Healthcare Providers: https://pope.com/https://www.fda.gov/media/136313/download This test is not yet approved or cleared by the [place] FDA and has been authorized for detection and/or diagnosis of SARS-CoV-2 by FDA under an Emergency Use Authorization (EUA).  This EUA will remain in effect (meaning this test can be used) for the duration of the COVID-19 declaration under Section 564(b)(1) of the Act, 21 U.S.C. section 360bbb-3(b)(1), unless the  authorization is terminated or revoked sooner. Performed at [facility], [address] [place], [place] [address]   MRSA PCR Screening     Status: None   Collection Time: 02/21/19  9:00 PM   Specimen: Nasopharyngeal  Result Value Ref Range Status   MRSA by PCR NEGATIVE NEGATIVE Final    Comment:        The GeneXpert MRSA Assay (FDA approved for NASAL specimens only), is one component of a comprehensive MRSA colonization surveillance program. It is not intended to diagnose MRSA infection nor to guide or monitor treatment for MRSA infections. Performed at [facility], [address] [place], [place] [address]     Radiology Studies: Dg Chest Port 1 View  Result Date: 02/21/2019 CLINICAL DATA:  Enlarged prostate, groin pain.  Shortness of breath EXAM: PORTABLE CHEST 1 VIEW COMPARISON:  02/19/2019 FINDINGS: Cardiomegaly with vascular congestion. Interstitial prominence  is increased since prior study concerning for interstitial edema. Lingular atelectasis. No visible effusions or acute bony abnormality. IMPRESSION: Cardiomegaly with vascular congestion and mild suspected interstitial edema. Lingular atelectasis. Electronically Signed   By: Charlett NoseKevin  Dover M.D.   On: 02/21/2019 15:55    35 minutes with more than 50% spent in reviewing records, counseling patient and coordinating care.  Daril Warga T. Saifullah Jolley [facility]  If 7PM-7AM, please contact night-coverage [email or web address] Password [email or web address] 02/22/2019, 1:11 PM

## 2019-02-22 NOTE — Consult Note (Signed)
Telepsych Consultation   Reason for Consult:  "Hx of Schizophrenia not on meds. Reportedly on Abilify and tegretol in the past. Also assessment for capacity" Referring Physician:  Dr. Candelaria Stagersaye Gonfa Location of Patient: [facility] Location of Provider: [facility]  Patient Identification: [name] MRN:  [id number] Principal Diagnosis: Cocaine use disorder, severe, dependence (HCC) Diagnosis:  Active Problems:   Type 2 diabetes mellitus (HCC)   Essential hypertension   Cocaine use disorder, severe, dependence (HCC)   Chronic paranoid schizophrenia (HCC)   Homelessness   Acute on chronic combined systolic and diastolic CHF (congestive heart failure) (HCC)   Microcytic anemia   CHF exacerbation (HCC)   Total Time spent with patient: 1 hour  Subjective:   [name] is a 58 y.o. male patient admitted with recurrent acute on chronic combined CHF and biventricular failure.  HPI:   Per chart review, patient was admitted with recurrent acute on chronic combined CHF and biventricular failure in the setting of noncompliance and polysubstance abuse. On interview, [name] apologizes for his behavior the last time he spoke to this notewriter. He reports that he has been "really sick breathing wise." He understands that homelessness is a barrier to adequately care for himself. He reports medication noncompliance due to homelessness. He reports that his legs hurt due to standing for extended periods of time. He reports that his normal weight is around 180 pounds but understands that he is larger due to fluid retention. He reports, "I'm going to let these people take care of me when I get my check" inferring that he would like to seek SNF placement. He denies SI, HI or AVH. He reports "dazing off" at times. He reports last using crack cocaine 8 days ago. UDS is positive for cocaine on admission.    Past Psychiatric History: Schizophrenia and cocaine abuse.  Risk to  Self:  None. Denies SI.  Risk to Others:  None. Denies HI.  Prior Inpatient Therapy:  He has been hospitalized multiple times.  [facility]:  [facility]   Past Medical History:  Past Medical History:  Diagnosis Date  . Chronic foot pain   . Cocaine abuse (HCC)   . Diabetes mellitus without complication (HCC)   . Hepatitis C    unsure   . Homelessness   . Hypertension   . Neuropathy   . Polysubstance abuse (HCC)   . Schizophrenia (HCC)   . Sleep apnea   . Systolic and diastolic CHF, chronic (HCC)     Past Surgical History:  Procedure Laterality Date  . MULTIPLE TOOTH EXTRACTIONS     Family History:  Family History  Problem Relation Age of Onset  . Hypertension Other   . Diabetes Other    Family Psychiatric  History: None per chart review.  Social History:  Social History   Substance and Sexual Activity  Alcohol Use Yes   Comment: Daily Drinker      Social History   Substance and Sexual Activity  Drug Use Yes  . Frequency: 7.0 times per week  . Types: "Crack" cocaine, Cocaine, Marijuana   Comment: Crack    Social History   Socioeconomic History  . Marital status: Single    Spouse name: Not on file  . Number of children: Not on file  . Years of education: Not on file  . Highest education level: Not on file  Occupational History  . Not on file  Social Needs  . Financial resource strain: Not on  file  . Food insecurity    Worry: Not on file    Inability: Not on file  . Transportation needs    Medical: Not on file    Non-medical: Not on file  Tobacco Use  . Smoking status: Current Every Day Smoker    Packs/day: 1.00    Years: 20.00    Pack years: 20.00    Types: Cigarettes  . Smokeless tobacco: Current User  Substance and Sexual Activity  . Alcohol use: Yes    Comment: Daily Drinker   . Drug use: Yes    Frequency: 7.0 times per week    Types: "Crack" cocaine, Cocaine, Marijuana    Comment: Crack  . Sexual activity: Not Currently   Lifestyle  . Physical activity    Days per week: Not on file    Minutes per session: Not on file  . Stress: Not on file  Relationships  . Social Musicianconnections    Talks on phone: Not on file    Gets together: Not on file    Attends religious service: Not on file    Active member of club or organization: Not on file    Attends meetings of clubs or organizations: Not on file    Relationship status: Not on file  Other Topics Concern  . Not on file  Social History Narrative   ** Merged History Encounter **       Additional Social History: He is homeless. He has a history of crack cocaine abuse.    Allergies:   Allergies  Allergen Reactions  . Haldol [Haloperidol] Other (See Comments)    Muscle spasms, loss of voluntary movement. However, pt has taken Thorazine on multiple occasions with no adverse effects.     Labs:  Results for orders placed or performed during the hospital encounter of 02/21/19 (from the past 48 hour(s))  Basic metabolic panel     Status: Abnormal   Collection Time: 02/21/19  2:38 PM  Result Value Ref Range   Sodium 137 135 - 145 mmol/L   Potassium 3.6 3.5 - 5.1 mmol/L   Chloride 96 (L) 98 - 111 mmol/L   CO2 28 22 - 32 mmol/L   Glucose, Bld 337 (H) 70 - 99 mg/dL   BUN 11 6 - 20 mg/dL   Creatinine, Ser 1.610.88 0.61 - 1.24 mg/dL   Calcium 8.9 8.9 - 09.610.3 mg/dL   GFR calc non Af Amer >60 >60 mL/min   GFR calc Af Amer >60 >60 mL/min   Anion gap 13 5 - 15    Comment: Performed at [facility], [address] [place], [place] [id number]  CBC with Differential     Status: Abnormal   Collection Time: 02/21/19  2:38 PM  Result Value Ref Range   WBC 8.3 4.0 - 10.5 K/uL   RBC 4.24 4.22 - 5.81 MIL/uL   Hemoglobin 8.8 (L) 13.0 - 17.0 g/dL    Comment: Reticulocyte Hemoglobin testing may be clinically indicated, consider ordering this additional test [id number]    HCT 32.5 (L) 39.0 - 52.0 %   MCV 76.7 (L) 80.0 - 100.0 fL   MCH 20.8 (L) 26.0 - 34.0 pg   MCHC  27.1 (L) 30.0 - 36.0 g/dL   RDW 82.921.6 (H) 56.211.5 - 13.015.5 %   Platelets 385 150 - 400 K/uL   nRBC 0.2 0.0 - 0.2 %   Neutrophils Relative % 79 %   Neutro Abs 6.6 1.7 - 7.7 K/uL  Lymphocytes Relative 10 %   Lymphs Abs 0.8 0.7 - 4.0 K/uL   Monocytes Relative 2 %   Monocytes Absolute 0.2 0.1 - 1.0 K/uL   Eosinophils Relative 2 %   Eosinophils Absolute 0.2 0.0 - 0.5 K/uL   Basophils Relative 1 %   Basophils Absolute 0.1 0.0 - 0.1 K/uL   WBC Morphology See Note     Comment: Mild Left Shift. 1 to 5% Metas and Myelos, Occ Pro Noted.   nRBC 0 0 /100 WBC   Metamyelocytes Relative 6 %   Abs Immature Granulocytes 0.50 (H) 0.00 - 0.07 K/uL   Polychromasia PRESENT     Comment: Performed at [facility], [address] [place], [place] [address]  Magnesium     Status: None   Collection Time: 02/21/19  2:38 PM  Result Value Ref Range   Magnesium 1.8 1.7 - 2.4 mg/dL    Comment: Performed at [facility], [address], [place], [place] [address]  Brain natriuretic peptide     Status: Abnormal   Collection Time: 02/21/19  2:38 PM  Result Value Ref Range   B Natriuretic Peptide 905.3 (H) 0.0 - 100.0 pg/mL    Comment: Performed at [facility], [address], [place], [place] [address]  MRSA PCR Screening     Status: None   Collection Time: 02/21/19  9:00 PM   Specimen: Nasopharyngeal  Result Value Ref Range   MRSA by PCR NEGATIVE NEGATIVE    Comment:        The GeneXpert MRSA Assay (FDA approved for NASAL specimens only), is one component of a comprehensive MRSA colonization surveillance program. It is not intended to diagnose MRSA infection nor to guide or monitor treatment for MRSA infections. Performed at [facility], [address] [place], [place] [address]   Glucose, capillary     Status: Abnormal   Collection Time: 02/21/19 10:05 PM  Result Value Ref Range   Glucose-Capillary 348 (H) 70 - 99 mg/dL  Basic metabolic panel     Status: Abnormal    Collection Time: 02/22/19  5:10 AM  Result Value Ref Range   Sodium 139 135 - 145 mmol/L   Potassium 3.9 3.5 - 5.1 mmol/L   Chloride 99 98 - 111 mmol/L   CO2 32 22 - 32 mmol/L   Glucose, Bld 237 (H) 70 - 99 mg/dL   BUN 9 6 - 20 mg/dL   Creatinine, Ser 6.57 0.61 - 1.24 mg/dL   Calcium 9.0 8.9 - 84.6 mg/dL   GFR calc non Af Amer >60 >60 mL/min   GFR calc Af Amer >60 >60 mL/min   Anion gap 8 5 - 15    Comment: Performed at [facility], [address] [place], [place] [address]  CBC     Status: Abnormal   Collection Time: 02/22/19  5:10 AM  Result Value Ref Range   WBC 6.1 4.0 - 10.5 K/uL   RBC 4.10 (L) 4.22 - 5.81 MIL/uL   Hemoglobin 8.5 (L) 13.0 - 17.0 g/dL    Comment: Reticulocyte Hemoglobin testing may be clinically indicated, consider ordering this additional test [id number]    HCT 31.2 (L) 39.0 - 52.0 %   MCV 76.1 (L) 80.0 - 100.0 fL   MCH 20.7 (L) 26.0 - 34.0 pg   MCHC 27.2 (L) 30.0 - 36.0 g/dL   RDW 01.0 (H) 27.2 - 53.6 %   Platelets 339 150 -  400 K/uL   nRBC 0.5 (H) 0.0 - 0.2 %    Comment: Performed at [facility], [address], [place], [place] [address]  Rapid urine drug screen (hospital performed)     Status: Abnormal   Collection Time: 02/22/19  6:03 AM  Result Value Ref Range   Opiates NONE DETECTED NONE DETECTED   Cocaine POSITIVE (A) NONE DETECTED   Benzodiazepines NONE DETECTED NONE DETECTED   Amphetamines NONE DETECTED NONE DETECTED   Tetrahydrocannabinol NONE DETECTED NONE DETECTED   Barbiturates NONE DETECTED NONE DETECTED    Comment: (NOTE) DRUG SCREEN FOR MEDICAL PURPOSES ONLY.  IF CONFIRMATION IS NEEDED FOR ANY PURPOSE, NOTIFY LAB WITHIN 5 DAYS. LOWEST DETECTABLE LIMITS FOR URINE DRUG SCREEN Drug Class                     Cutoff (ng/mL) Amphetamine and metabolites    1000 Barbiturate and metabolites    200 Benzodiazepine                 200 Tricyclics and metabolites     300 Opiates and metabolites        300 Cocaine and  metabolites        300 THC                            50 Performed at [facility], [address], [place], [place] [address]   Glucose, capillary     Status: Abnormal   Collection Time: 02/22/19  6:37 AM  Result Value Ref Range   Glucose-Capillary 219 (H) 70 - 99 mg/dL  Vitamin U98     Status: None   Collection Time: 02/22/19 10:20 AM  Result Value Ref Range   Vitamin B-12 614 180 - 914 pg/mL    Comment: (NOTE) This assay is not validated for testing neonatal or myeloproliferative syndrome specimens for Vitamin B12 levels. Performed at [facility], [address], [place], [place] [address]   Folate     Status: None   Collection Time: 02/22/19 10:20 AM  Result Value Ref Range   Folate 15.3 >5.9 ng/mL    Comment: Performed at [facility], [address] [place], [place] [address]  Iron and TIBC     Status: Abnormal   Collection Time: 02/22/19 10:20 AM  Result Value Ref Range   Iron 14 (L) 45 - 182 ug/dL   TIBC 621 (H) 308 - 657 ug/dL   Saturation Ratios 3 (L) 17.9 - 39.5 %   UIBC 465 ug/dL    Comment: Performed at [facility], [address], [place], [place] [address]  Ferritin     Status: Abnormal   Collection Time: 02/22/19 10:20 AM  Result Value Ref Range   Ferritin 13 (L) 24 - 336 ng/mL    Comment: Performed at [facility], [address], [place], [place] [address]  Reticulocytes     Status: Abnormal   Collection Time: 02/22/19 10:20 AM  Result Value Ref Range   Retic Ct Pct 1.4 0.4 - 3.1 %   RBC. 4.10 (L) 4.22 - 5.81 MIL/uL   Retic Count, Absolute 57.8 19.0 - 186.0 K/uL   Immature Retic Fract 22.7 (H) 2.3 - 15.9 %    Comment: Performed at [facility], [address], [place], [place] [address]  Glucose, capillary     Status: Abnormal  Collection Time: 02/22/19 11:17 AM  Result Value Ref Range   Glucose-Capillary 250 (H) 70 - 99 mg/dL  Glucose, capillary     Status: Abnormal   Collection Time: 02/22/19  4:11 PM   Result Value Ref Range   Glucose-Capillary 239 (H) 70 - 99 mg/dL    Medications:  Current Facility-Administered Medications  Medication Dose Route Frequency Provider Last Rate Last Dose  . 0.9 %  sodium chloride infusion  250 mL Intravenous PRN Wouk, Ailene Rud, MD      . acetaminophen (TYLENOL) tablet 650 mg  650 mg Oral Q6H PRN Vertis Kelch, NP   650 mg at 02/21/19 2141  . enoxaparin (LOVENOX) injection 60 mg  60 mg Subcutaneous Q24H Wouk, Ailene Rud, MD   60 mg at 02/21/19 2141  . furosemide (LASIX) injection 40 mg  40 mg Intravenous TID Bhagat, Bhavinkumar, PA      . hydrALAZINE (APRESOLINE) injection 10 mg  10 mg Intravenous Q6H PRN Bodenheimer, Charles A, NP   10 mg at 02/21/19 2141  . insulin aspart (novoLOG) injection 0-15 Units  0-15 Units Subcutaneous TID WC Wouk, Ailene Rud, MD   5 Units at 02/22/19 1646  . insulin aspart (novoLOG) injection 0-5 Units  0-5 Units Subcutaneous QHS Gwynne Edinger, MD   4 Units at 02/21/19 2253  . insulin glargine (LANTUS) injection 10 Units  10 Units Subcutaneous BID Gonfa, Taye T, MD      . lisinopril (ZESTRIL) tablet 10 mg  10 mg Oral Daily Bhagat, Bhavinkumar, PA   10 mg at 02/22/19 1538  . potassium chloride SA (K-DUR) CR tablet 20 mEq  20 mEq Oral Daily Bhagat, Bhavinkumar, PA   20 mEq at 02/22/19 1646  . sodium chloride flush (NS) 0.9 % injection 3 mL  3 mL Intravenous Q12H Wouk, Ailene Rud, MD   3 mL at 02/22/19 1131  . sodium chloride flush (NS) 0.9 % injection 3 mL  3 mL Intravenous PRN Wouk, Ailene Rud, MD      . traMADol Veatrice Bourbon) tablet 50 mg  50 mg Oral Q6H PRN Vertis Kelch, NP   50 mg at 02/22/19 8938    Musculoskeletal: Strength & Muscle Tone: No atrophy noted. Gait & Station: UTA since patient is sitting in a chair. Patient leans: N/A  Psychiatric Specialty Exam: Physical Exam  Nursing note and vitals reviewed. Constitutional: He is oriented to person, place, and time. He appears well-developed  and well-nourished.  HENT:  Head: Normocephalic and atraumatic.  Neck: Normal range of motion.  Respiratory: Effort normal.  Musculoskeletal: Normal range of motion.  Neurological: He is alert and oriented to person, place, and time.  Psychiatric: He has a normal mood and affect. His speech is normal and behavior is normal. Judgment and thought content normal. Cognition and memory are normal.    Review of Systems  Respiratory: Positive for shortness of breath.   Genitourinary:       Testicular pain  Psychiatric/Behavioral: Positive for substance abuse. Negative for hallucinations and suicidal ideas. The patient does not have insomnia.   All other systems reviewed and are negative.   Blood pressure (!) 157/114, pulse 99, temperature 97.6 F (36.4 C), temperature source Oral, resp. rate 17, height 5\' 9"  (1.753 m), weight 119.8 kg, SpO2 93 %.Body mass index is 39.02 kg/m.  General Appearance: Fairly Groomed, middle aged, African American male, wearing a hospital gown with poor dentition who is sitting in a chair. NAD.   Eye Contact:  Good  Speech:  Clear and Coherent and Normal Rate  Volume:  Normal  Mood:  Euthymic  Affect:  Appropriate and Congruent  Thought Process:  Goal Directed, Linear and Descriptions of Associations: Intact  Orientation:  Full (Time, Place, and Person)  Thought Content:  Logical  Suicidal Thoughts:  No  Homicidal Thoughts:  No  Memory:  Immediate;   Good Recent;   Good Remote;   Good  Judgement:  Fair  Insight:  Fair  Psychomotor Activity:  Normal  Concentration:  Concentration: Good and Attention Span: Good  Recall:  Good  Fund of Knowledge:  Good  Language:  Good  Akathisia:  No  Handed:  Right  AIMS (if indicated):   N/A  Assets:  Communication Skills Desire for Improvement Resilience  ADL's:  Intact  Cognition:  WNL  Sleep:   Okay    Assessment: [name] is a 58 y.o. male who was admitted with recurrent acute on chronic  combined CHF and biventricular failure in the setting of noncompliance and polysubstance abuse. He denies SI, HI or AVH. He does not appear to be responding to internal stimuli and appears to be at his baseline. He demonstrates capacity to make decisions regarding his current medical care. He understands his medical conditions and the risks associated with noncompliance with his medications. Recommend resuming home psychotropic medications.    Treatment Plan Summary: -Resume home Tegretol 100 mg BID and Abilify 10 mg daily for mood stabilization.  -EKG reviewed and QTc 431 on 8/23. Please closely monitor when starting or increasing QTc prolonging agents.  -Patient demonstrates capacity to make decisions regarding his current medical care.  -Psychiatry will sign off on patient at this time. Please consult psychiatry again as needed.   Disposition: No evidence of imminent risk to self or others at present.   Patient does not meet criteria for psychiatric inpatient admission.  This service was provided via telemedicine using a 2-way, interactive audio and video technology.  Names of all persons participating in this telemedicine service and their role in this encounter. Name: Juanetta BeetsJacqueline Norman, DO Role: Psychiatrist  Name: [name] Role: Patient    Cherly BeachJacqueline J Norman, DO 02/22/2019 5:43 PM

## 2019-02-22 NOTE — ED Triage Notes (Signed)
Patient reports chronic bilateral testicular swelling , O2 sat= 82 room air.

## 2019-02-22 NOTE — Progress Notes (Signed)
Pt expressed to the nurse that he wants to leave AMA. Nurse educated pt on the importance of leaving and Paged MD. MD gave pt the okay to leave if they wanted to sign out AMA. Pt signed AMA papers. IV and tele removed from pt.

## 2019-02-22 NOTE — Consult Note (Addendum)
Cardiology Consultation:   Patient ID: [name] MRN: [id number]; DOB: [birth year]  Admit date: 02/21/2019 Date of Consult: 02/22/2019  Primary Care Provider: Patient, No Pcp Per Primary Cardiologist: Taylor Moores, MD  Primary Electrophysiologist:  Dr. Lovena [name]   Patient Profile:   [name] is a 58 y.o. male with a hx of chronic combined CHF, biventricular failure, uncontrolled diabetes, hypertension, obstructive sleep apnea, schizophrenia, high degree AV block, homelessness and polysubstance abuse who is being seen today for the evaluation of CHF at the request of Dr. Cyndia [name].   Last echocardiogram May 2020 showed LV function of 25 to 30%, elevated end-diastolic pressure, severely reduced right ventricular systolic function  Patient is a frequent ER flyer and multiple admission for CHF.  Patient with history of high-grade AV block mostly Mobitz 2.  He was evaluated by Dr. Lovena [name] in June/2020 admission.  Patient was asymptomatic.  Did not felt candidate for pacemaker given multiple comorbidities, associated situation and noncompliance.  History of Present Illness:   [name]  admitted multiple times for CHF and ER visit>> left AMA.  He presented last night with shortness of breath, swelling in his legs, abdomen and scrotum.  Requesting admission this time.  BNP 905.  Hemoglobin 8.5.  Pending COVID.  Cocaine positive on urine drug screen.  However, denies recent use.  So far got IV Lasix 40 mg x 1.  Blood pressure elevated.  Cardiology asked for further evaluation.  Heart Pathway Score:     Past Medical History:  Diagnosis Date  . Chronic foot pain   . Cocaine abuse ([place])   . Diabetes mellitus without complication ([name])   . Hepatitis C    unsure   . Homelessness   . Hypertension   . Neuropathy   . Polysubstance abuse ([place])   . Schizophrenia ([name])   . Sleep apnea   . Systolic and diastolic CHF, chronic ([name])     Past Surgical History:  Procedure  Laterality Date  . MULTIPLE TOOTH EXTRACTIONS       Inpatient Medications: Scheduled Meds: . enoxaparin (LOVENOX) injection  60 mg Subcutaneous Q24H  . insulin aspart  0-15 Units Subcutaneous TID WC  . insulin aspart  0-5 Units Subcutaneous QHS  . insulin glargine  5 Units Subcutaneous Daily  . sodium chloride flush  3 mL Intravenous Q12H   Continuous Infusions: . sodium chloride     PRN Meds: sodium chloride, acetaminophen, hydrALAZINE, sodium chloride flush, traMADol  Allergies:    Allergies  Allergen Reactions  . Haldol [Haloperidol] Other (See Comments)    Muscle spasms, loss of voluntary movement. However, pt has taken Thorazine on multiple occasions with no adverse effects.     Social History:   Social History   Socioeconomic History  . Marital status: Single    Spouse name: Not on file  . Number of children: Not on file  . Years of education: Not on file  . Highest education level: Not on file  Occupational History  . Not on file  Social Needs  . Financial resource strain: Not on file  . Food insecurity    Worry: Not on file    Inability: Not on file  . Transportation needs    Medical: Not on file    Non-medical: Not on file  Tobacco Use  . Smoking status: Current Every Day Smoker    Packs/day: 1.00    Years: 20.00    Pack years: 20.00    Types: Cigarettes  .  Smokeless tobacco: Current User  Substance and Sexual Activity  . Alcohol use: Yes    Comment: Daily Drinker   . Drug use: Yes    Frequency: 7.0 times per week    Types: "Crack" cocaine, Cocaine, Marijuana    Comment: Crack  . Sexual activity: Not Currently  Lifestyle  . Physical activity    Days per week: Not on file    Minutes per session: Not on file  . Stress: Not on file  Relationships  . Social Musicianconnections    Talks on phone: Not on file    Gets together: Not on file    Attends religious service: Not on file    Active member of club or organization: Not on file    Attends meetings  of clubs or organizations: Not on file    Relationship status: Not on file  . Intimate partner violence    Fear of current or ex partner: Not on file    Emotionally abused: Not on file    Physically abused: Not on file    Forced sexual activity: Not on file  Other Topics Concern  . Not on file  Social History Narrative   ** Merged History Encounter **        Family History:    Family History  Problem Relation Age of Onset  . Hypertension Other   . Diabetes Other      ROS:  Please see the history of present illness.  All other ROS reviewed and negative.     Physical Exam/Data:   Vitals:   02/22/19 0357 02/22/19 0745 02/22/19 0807 02/22/19 1115  BP: (!) 145/108 (!) 194/99 (!) 165/98 (!) 161/99  Pulse: (!) 103 97  95  Resp: 20 20  18   Temp: 98.8 F (37.1 C) 97.9 F (36.6 C)  (!) 97.4 F (36.3 C)  TempSrc: Oral Oral  Oral  SpO2: 97% 97%  100%  Weight:      Height:        Intake/Output Summary (Last 24 hours) at 02/22/2019 1314 Last data filed at 02/22/2019 1240 Gross per 24 hour  Intake 1260 ml  Output 850 ml  Net 410 ml   Last 3 Weights 02/22/2019 02/21/2019 02/21/2019  Weight (lbs) 264 lb 3.2 oz 262 lb 14.4 oz 244 lb 11.4 oz  Weight (kg) 119.84 kg 119.251 kg 111 kg  Some encounter information is confidential and restricted. Go to Review Flowsheets activity to see all data.     Body mass index is 39.02 kg/m.  General: Poorly groomed male in no acute distress HEENT: normal Lymph: no adenopathy Neck: JVD difficult to assess due to grith Endocrine:  No thryomegaly Vascular: No carotid bruits; FA pulses 2+ bilaterally without bruits  Cardiac:  normal S1, S2; RRR; no murmur  Lungs:  clear to auscultation bilaterally, no wheezing, rhonchi or rales  Abd: soft, nontender, no hepatomegaly  Ext: 1+ BL [name] edema with chronic venous statis Musculoskeletal:  No deformities, BUE and BLE strength normal and equal Skin: warm and dry  Neuro:  CNs 2-12 intact, no focal  abnormalities noted Psych:  Normal affect   EKG:  The EKG was personally reviewed and demonstrates:  Sinus tachycardia at rate of 105 bpm, chronic TWI in lateral leads  Telemetry:  Telemetry was personally reviewed and demonstrates:  Sinus rhythm at 90s, intermittently rate in 120s to low 30s with Mobitz 2  Relevant CV Studies:  Echo 10/2018  1. The left ventricle has severely reduced  systolic function, with an ejection fraction of 25-30%. The cavity size was mildly dilated. There is moderate concentric left ventricular hypertrophy. Left ventricular diastolic Doppler parameters are  consistent with restrictive filling. Elevated left atrial and left ventricular end-diastolic pressures There is right ventricular volume and pressure overload. Left ventricular diffuse hypokinesis.  2. The right ventricle has severely reduced systolic function. The cavity was moderately enlarged. There is no increase in right ventricular wall thickness. Right ventricular systolic pressure is moderately elevated with an estimated pressure of 51.2  mmHg.  3. Left atrial size was moderately dilated.  4. Right atrial size was severely dilated.  5. Small pericardial effusion.  6. The pericardial effusion is posterior to the left ventricle.  7. Mild thickening of the mitral valve leaflet. No evidence of mitral valve stenosis.  8. The tricuspid valve is grossly normal. Tricuspid valve regurgitation is moderate.  9. The aortic valve is tricuspid. Mild thickening of the aortic valve. Mild calcification of the aortic valve. Aortic valve regurgitation is mild by color flow Doppler. No stenosis of the aortic valve. 10. The inferior vena cava was dilated in size with <50% respiratory variability.  Laboratory Data:  High Sensitivity Troponin:   Recent Labs  Lab 01/26/19 1117 01/26/19 1623 01/30/19 0131 01/30/19 0317  TROPONINIHS 23* 21* 20* 23*     Chemistry Recent Labs  Lab 02/19/19 1830 02/21/19 1438 02/22/19  0510  NA 138 137 139  K 3.4* 3.6 3.9  CL 97* 96* 99  CO2 28 28 32  GLUCOSE 304* 337* 237*  BUN 13 11 9   CREATININE 0.87 0.88 0.80  CALCIUM 8.7* 8.9 9.0  GFRNONAA >60 >60 >60  GFRAA >60 >60 >60  ANIONGAP 13 13 8     Hematology Recent Labs  Lab 02/19/19 1830 02/21/19 1438 02/22/19 0510 02/22/19 1020  WBC 7.6 8.3 6.1  --   RBC 4.14* 4.24 4.10* 4.10*  HGB 8.7* 8.8* 8.5*  --   HCT 31.8* 32.5* 31.2*  --   MCV 76.8* 76.7* 76.1*  --   MCH 21.0* 20.8* 20.7*  --   MCHC 27.4* 27.1* 27.2*  --   RDW 21.8* 21.6* 21.2*  --   PLT 358 385 339  --    BNP Recent Labs  Lab 02/19/19 1830 02/21/19 1438  BNP 1,064.4* 905.3*    Radiology/Studies:  Dg Chest Port 1 View  Result Date: 02/21/2019 CLINICAL DATA:  Enlarged prostate, groin pain.  Shortness of breath EXAM: PORTABLE CHEST 1 VIEW COMPARISON:  02/19/2019 FINDINGS: Cardiomegaly with vascular congestion. Interstitial prominence is increased since prior study concerning for interstitial edema. Lingular atelectasis. No visible effusions or acute bony abnormality. IMPRESSION: Cardiomegaly with vascular congestion and mild suspected interstitial edema. Lingular atelectasis. Electronically Signed   By: Charlett NoseKevin  Dover M.D.   On: 02/21/2019 15:55   Dg Chest Port 1 View  Result Date: 02/19/2019 CLINICAL DATA:  Shortness of breath. EXAM: PORTABLE CHEST 1 VIEW COMPARISON:  Multiple prior exams most recently 02/12/2019 FINDINGS: Chronic cardiomegaly. Unchanged mediastinal contours. Left midlung scarring is unchanged. Similar appearance of retrocardiac opacity prior exams. Vascular congestion without pulmonary edema. No pneumothorax or acute airspace disease. IMPRESSION: Unchanged radiographic appearance of the chest with stable cardiomegaly and left midlung scarring. Electronically Signed   By: Narda RutherfordMelanie  Sanford M.D.   On: 02/19/2019 22:49    Assessment and Plan:   1. Acute on chronic combined CHF/Biv failure in setting of non compliance and  polysubstance abuse - LVEF of 25-30 by echo last in  10/2018.  Patient is markedly volume overloaded by exam.  Agree with p.o. Lasix.  Watch renal function closely.  Avoid beta-blocker given history of cocaine abuse and transient high degree AV block.  Consider adding ACE.  Compliance and low-sodium diet would be key.  2.  High degree AV block -Patient with a transient asymptomatic AV block.  Previously evaluated by Dr. Ladona Ridgelaylor and did not felt good candidate for pacemaker given multiple comorbidities and noncompliance with poor social situation. -Patient has transient bradycardia we well as sinus tachycardia in 110-120s.  3. HTN - Elevated. Diuretics as above. Add ACE or ARB.   4.  polysubstance abuse -Cocaine positive this admission.  - Discussed cessation.  Seems poor insight of his health condition.  5.  Uncontrolled diabetes mellitus - Last A1c was 9.7 in 11/2018. - Not taking any medications outside hospital - Per primary team  6. Microcytic anemia - Per primary team  7. Homelessness - Child psychotherapistocial worker consulted    For questions or updates, please contact [facility] Please consult [email or web address] for contact info under     Lorelei PontSigned, Bhavinkumar Bhagat, PA  02/22/2019 1:14 PM   Personally seen and examined. Agree with above.  We will go ahead and continue with aggressive diuresis with IV Lasix and monitor him on telemetry for any signs of worsening heart block.  Has been seen by electrophysiology in the past, does not need a pacemaker at this time.  Clearly he has anasarca, increased fluid in the abdominal region as well as scrotal region.  He is alert.  EF 25 to 30%.  Continue to discourage cocaine use.  Donato SchultzMark Danta Baumgardner, MD

## 2019-02-23 ENCOUNTER — Emergency Department (HOSPITAL_COMMUNITY)
Admission: EM | Admit: 2019-02-23 | Discharge: 2019-02-23 | Disposition: A | Discharge status: Home / Self Care | Payer: Medicaid Other | Source: Home / Self Care | Attending: Emergency Medicine | Admitting: Emergency Medicine

## 2019-02-23 ENCOUNTER — Encounter (HOSPITAL_COMMUNITY): Payer: Self-pay | Admitting: Emergency Medicine

## 2019-02-23 DIAGNOSIS — E1140 Type 2 diabetes mellitus with diabetic neuropathy, unspecified: Secondary | ICD-10-CM | POA: Insufficient documentation

## 2019-02-23 DIAGNOSIS — Z7984 Long term (current) use of oral hypoglycemic drugs: Secondary | ICD-10-CM | POA: Insufficient documentation

## 2019-02-23 DIAGNOSIS — I5040 Unspecified combined systolic (congestive) and diastolic (congestive) heart failure: Secondary | ICD-10-CM | POA: Insufficient documentation

## 2019-02-23 DIAGNOSIS — I110 Hypertensive heart disease with heart failure: Secondary | ICD-10-CM | POA: Insufficient documentation

## 2019-02-23 DIAGNOSIS — E119 Type 2 diabetes mellitus without complications: Secondary | ICD-10-CM | POA: Insufficient documentation

## 2019-02-23 DIAGNOSIS — F17210 Nicotine dependence, cigarettes, uncomplicated: Secondary | ICD-10-CM | POA: Insufficient documentation

## 2019-02-23 DIAGNOSIS — G8929 Other chronic pain: Secondary | ICD-10-CM | POA: Insufficient documentation

## 2019-02-23 MED ORDER — ACETAMINOPHEN 500 MG PO TABS
500.0000 mg | ORAL_TABLET | Freq: Once | ORAL | Status: AC
  Administered 2019-02-23: 500 mg via ORAL
  Filled 2019-02-23: qty 1

## 2019-02-23 NOTE — ED Notes (Signed)
Pt Sa02 96% on 5L 02 per [place]

## 2019-02-23 NOTE — ED Notes (Signed)
Placed pt on 5L 02 per [place], Sa02 is 93%

## 2019-02-23 NOTE — ED Provider Notes (Addendum)
[facility] EMERGENCY DEPARTMENT Provider Note   CSN: [id number] Arrival date & time: 02/23/19  1339     History   Chief Complaint Chief Complaint  Patient presents with  . Leg Swelling    HPI [name] is a 58 y.o. male.     HPI   [name] is a 58 y.o. male, with a history of cocaine abuse, DM, chronic pain, HTN, schizophrenia, CHF, presenting to the ED with request for "Tylenol and a list of local shelters." Patient states, "I was at the gas station, urinating outside because I am homeless.  The manager saw me and called the police.  The police told me they were going to arrest me so I told them I needed to come to the hospital."  Denies fever/chills, shortness of breath, chest pain, acute edema, abdominal pain, syncope, palpitations, or any other complaints.    Past Medical History:  Diagnosis Date  . Chronic foot pain   . Cocaine abuse ([place])   . Diabetes mellitus without complication ([place])   . Hepatitis C    unsure   . Homelessness   . Hypertension   . Neuropathy   . Polysubstance abuse ([place])   . Schizophrenia ([place])   . Sleep apnea   . Systolic and diastolic CHF, chronic [facility])     Patient Active Problem List   Diagnosis Date Noted  . CHF (congestive heart failure), NYHA class III, acute on chronic, combined ([place]) 02/02/2019  . Scrotal edema 01/30/2019  . Combined systolic and diastolic heart failure ([place]) 01/19/2019  . Chest pain 01/16/2019  . 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  . Medically noncompliant   . Acute on chronic systolic CHF (congestive heart failure) ([place]) 01/02/2019  . Acute on chronic combined systolic (congestive) and diastolic (congestive) heart failure ([place]) 01/02/2019  . Acute on chronic systolic heart failure ([place]) 12/26/2018  . Mobitz type II atrioventricular block 12/25/2018  . Acute exacerbation of CHF (congestive heart failure) ([place]) 12/22/2018  . Acute on chronic respiratory failure with hypoxia ([place])  12/22/2018  . CHF exacerbation ([place]) 12/19/2018  . Acute CHF (congestive heart failure) ([place]) 12/16/2018  . Diabetes mellitus without complication ([place]) 21/30/8657  . Acute on chronic systolic (congestive) heart failure ([place]) 12/11/2018  . Abdominal pain 12/11/2018  . Nausea vomiting and diarrhea 12/11/2018  . Microcytic anemia 12/11/2018  . Evaluation by psychiatric service required   . MDD (major depressive disorder), severe ([employer]) 11/25/2018  . Pressure injury of skin 11/08/2018  . Elevated troponin 10/18/2018  . HLD (hyperlipidemia) 10/18/2018  . Anxiety 10/18/2018  . CHF (congestive heart failure), NYHA class II, acute on chronic, combined ([place]) 10/18/2018  . Hypertensive urgency 10/12/2018  . Mild renal insufficiency 10/12/2018  . Cellulitis 10/12/2018  . Penile cellulitis   . Adjustment disorder with mixed disturbance of emotions and conduct   . Sleep apnea 10/03/2018  . Hypokalemia 09/17/2018  . Scrotal swelling 09/17/2018  . Urinary hesitancy   . Constipation   . Acute on chronic combined systolic and diastolic CHF (congestive heart failure) ([place]) 08/25/2018  . Acute respiratory failure with hypoxia ([place]) 08/25/2018  . Tobacco use 08/18/2018  . Homelessness 08/08/2018  . Smoker 08/08/2018  . Prostate enlargement 03/16/2018  . Aortic atherosclerosis ([place]) 03/16/2018  . 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 Cocaine abuse with cocaine-induced mood disorder ([place]) 09/18/2017  . Chronic foot pain   . Schizoaffective disorder, bipolar type ([place]) 09/30/2016  . Substance induced mood disorder ([place])  03/13/2015  . Schizophrenia, paranoid type ([facility]) 01/17/2015  . Suicidal ideation   . Drug hallucinosis ([facility]) 10/08/2014  . Chronic paranoid schizophrenia ([facility]) 09/07/2014  . 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 Urinary retention   . Cocaine use disorder, severe, dependence ([facility])   . Essential  hypertension 03/28/2013  . Type 2 diabetes mellitus ([facility]) 03/15/2013    Past Surgical History:  Procedure Laterality Date  . MULTIPLE TOOTH EXTRACTIONS          Home Medications    Prior to Admission medications   Medication Sig Start Date End Date Taking? Authorizing Provider  furosemide (LASIX) 40 MG tablet Take 1 tablet (40 mg total) by mouth 2 (two) times daily. 02/17/19 03/19/19  Harlene Salts A, PA-C  glipiZIDE (GLUCOTROL) 5 MG tablet Take 0.5 tablets (2.5 mg total) by mouth 2 (two) times daily before a meal. 01/01/19   Rai, Ripudeep K, MD  Lidocaine, Anorectal, 5 % CREA Apply to sore on penis as needed for pain. 02/20/19   Molpus, John, MD    Family History Family History  Problem Relation Age of Onset  . Hypertension Other   . Diabetes Other     Social History Social History   Tobacco Use  . Smoking status: Current Every Day Smoker    Packs/day: 1.00    Years: 20.00    Pack years: 20.00    Types: Cigarettes  . Smokeless tobacco: Current User  Substance Use Topics  . Alcohol use: Yes    Comment: Daily Drinker   . Drug use: Yes    Frequency: 7.0 times per week    Types: "Crack" cocaine, Cocaine, Marijuana    Comment: Crack     Allergies   Haldol [haloperidol]   Review of Systems Review of Systems  Constitutional: Negative for chills and fever.       Requesting Tylenol and a list of shelters.  Respiratory: Negative for cough and shortness of breath.   Cardiovascular: Negative for chest pain.  Gastrointestinal: Negative for abdominal pain, diarrhea, nausea and vomiting.  Neurological: Negative for syncope, weakness and numbness.  All other systems reviewed and are negative.    Physical Exam Updated Vital Signs BP (!) 152/99   Pulse (!) 107   Temp 98.1 F (36.7 C) (Oral)   Resp (!) 28   SpO2 98%   Physical Exam Vitals signs and nursing note reviewed.  Constitutional:      General: He is not in acute distress.    Appearance: He is  well-developed. He is not diaphoretic.  HENT:     Head: Normocephalic and atraumatic.     Mouth/Throat:     Mouth: Mucous membranes are moist.     Pharynx: Oropharynx is clear.  Eyes:     Conjunctiva/sclera: Conjunctivae normal.  Neck:     Musculoskeletal: Neck supple.  Cardiovascular:     Rate and Rhythm: Regular rhythm. Tachycardia present.     Pulses: Normal pulses.          Radial pulses are 2+ on the right side and 2+ on the left side.       Posterior tibial pulses are 2+ on the right side and 2+ on the left side.     Heart sounds: Normal heart sounds.     Comments: Tactile temperature in the extremities appropriate and equal bilaterally. Mild tachycardia. Pulmonary:     Effort: Pulmonary effort is normal. No respiratory distress.  Breath sounds: Rales present.     Comments: Patient has some rales in the bases.  He states this is normal for him.  He does not have any acute shortness of breath.  He does not want this evaluated. Patient is also noted to be able to lie supine in the exam chair without any noted difficulty. Abdominal:     Palpations: Abdomen is soft.     Tenderness: There is no abdominal tenderness. There is no guarding.  Lymphadenopathy:     Cervical: No cervical adenopathy.  Skin:    General: Skin is warm and dry.  Neurological:     Mental Status: He is alert.  Psychiatric:        Mood and Affect: Mood and affect normal.        Speech: Speech normal.        Behavior: Behavior normal.      ED Treatments / Results  Labs (all labs ordered are listed, but only abnormal results are displayed) Labs Reviewed - No data to display  EKG None  Radiology No results found.  Procedures Procedures (including critical care time)  Medications Ordered in ED Medications  acetaminophen (TYLENOL) tablet 500 mg (500 mg Oral Given 02/23/19 1738)     Initial Impression / Assessment and Plan / ED Course  I have reviewed the triage vital signs and the nursing  notes.  Pertinent labs & imaging results that were available during my care of the patient were reviewed by me and considered in my medical decision making (see chart for details).        Patient presents requesting Tylenol and a list of shelters.  He states he came to the ED so that he would not get arrested.  He explicitly refuses any other evaluation or care as he denies any other complaints. Patient noted to be mildly tachycardic, but this is a typical finding in this patient.  Findings and plan of care discussed with Mancel BaleElliott Wentz, MD.     Final Clinical Impressions(s) / ED Diagnoses   Final diagnoses:  Other chronic pain    ED Discharge Orders    None       Concepcion LivingJoy, Jeremiah Curci C, PA-C 02/23/19 1804    Anselm PancoastJoy, Suann Klier C, PA-C 02/23/19 1807    Mancel BaleWentz, Elliott, MD 02/09/2019 [phone]

## 2019-02-23 NOTE — ED Notes (Signed)
Patient was attempting to leave via taxi prior to being seen by provider, according to taxi driver, patient fell while trying to get into taxi. This RN and EDT Starla Link found patient on the ground beside of taxi, patient was assisted into wheelchair and denied any injuries sustained from falling.

## 2019-02-23 NOTE — ED Notes (Signed)
Pt seen being wheeled up the hill towards the parking lot via wheelchair

## 2019-02-23 NOTE — ED Notes (Signed)
Pt has 1+ edema of feet bilat. 1+ pedal pulse bilat, pt able to wiggle toes.

## 2019-02-23 NOTE — ED Notes (Signed)
Patient stated that he had defecated himself, EDT Starla Link provided patient with towels and clean scrubs so that he could change.

## 2019-02-23 NOTE — ED Notes (Signed)
Pt stating that he would like to leave and requesting to be pushed outside in his wheelchair.

## 2019-02-23 NOTE — ED Triage Notes (Signed)
Pt was trespassing at a shell station police was called and when they arrives pt told police he needed to go to the hospital due to his chronic leg and testicle swelling. Pt in no distress. Pt has no new complaints.

## 2019-02-23 NOTE — Discharge Summary (Signed)
Physician Discharge Summary  [name] BPZ:[id number] DOB: 04-07-1961 DOA: 02/21/2019  PCP: Patient, No Pcp Per  Admit date: 02/21/2019 Discharge date: 02/23/2019  Admitted From: Homeless Disposition: Left AMA overnight.  Discharge Condition: Not stable. CODE STATUS: Full code  HPI: Per Dr. Sunday [name] is a 58 y.o. male with medical history significant for homelessness, schizophrenia, [name], DM uncontrolled, obesity, who presents w/ above.  Multiple ED visits and hospitalizations. Most recently admitted earlier this month for chf exacerbation, left AMA the next day.  Presents today complaining of swelling in legs and scrotum and shortness of breath. He denies fever, chest pain, nausea/vomiting, dysuria. Denies melena/hematochezia. Denies recent drug use. Says he has not been compliant with his medications or outpatient f/u.   ED Course: labs, lasix, CXR  Hospital Course: 58 y.o.malewith medical history significant forhomelessness, schizophrenia, [name], [name], obesity and medication noncompliance presenting with shortness of breath and swelling.  BNP elevated.  CXR consistent with CHF.  Admitted for CHF exacerbation on IV Lasix.  The next day, cardiology consulted for assistance with CHF management.  Psychiatry consulted for assistance with management of schizophrenia and assessment of medical decision making capacity.  Per psychiatry, patient was deemed to have capacity to make medical decision.  He was started on home Abilify and Tegretol.  Patient left AMA overnight  See individual problem list below for more.  Discharge Diagnoses:  Acute respiratory failure with hypoxia: Likely due to CHF exacerbation.  Desaturated to 70s on room air with ambulation.  Improving with diuresis.   -Wean oxygen as able  -Manage CHF as below.  Acute on chronic combined CHF: Echo in 5/20 with EF 25-30, moderate LVH, DD, diffuse hypokinesis, moderate LAE,  severe RAE, and RVSP to 51 mmHg.  Patient with significant edema including scrotal edema, vascular congestion on CXR and elevated BNP.  UDS positive for cocaine.  Had about 1 L urine output.  Correction is stable. -Increase IV Lasix to 40 mg 3 times daily. -Daily weight, intake output and renal function -Salt and fluid restriction -Scrotal sling -Cardiology consulted for guidance.  [name]-2 with hyperglycemia: A1c 9.7 and 6/20.  Not compliant w/ home meds.  On glipizide 2.5 mg twice daily which is not sufficient. -Add Lantus at 30 units twice daily -Continue SSI -Start statin.  HTN -BP elevated. -Increase Lasix as above -We will initiate GDMT for his CHF  Microcytic anemia: Hgb 8.5 which seems to baseline. -Stool occult blood ordered -Anemia panel  History of schizophrenia: He states he has been on Abilify and Tegretol in the past.  I do not see this on his home medication.  Previously left AMA multiple times.  Evaluated by psychiatry and deemed to have medical decision-making capacity. -Start Abilify and Tegretol per psychiatry.  Polysubstance use disorder: UDS positive for cocaine. -Counseled. -Avoid beta-blockers.  Homelessness/noncompliance/frequent hospitalizations/ED visit: -CSW consulted.  Morbid obesity: BMI 39. -Difficult case given his psych, social and other comorbid issues.    Discharge Instructions   Allergies as of 02/22/2019      Reactions   Haldol [haloperidol] Other (See Comments)   Muscle spasms, loss of voluntary movement. However, pt has taken Thorazine on multiple occasions with no adverse effects.       Medication List    ASK your doctor about these medications   furosemide 40 MG tablet Commonly known as: LASIX Take 1 tablet (40 mg total) by mouth 2 (two) times daily.   glipiZIDE 5 MG tablet Commonly known  as: GLUCOTROL Take 0.5 tablets (2.5 mg total) by mouth 2 (two) times daily before a meal.   Lidocaine (Anorectal) 5 % Crea  Apply to sore on penis as needed for pain.        Consultations:  Cardiology  Psychiatry  Procedures/Studies:  2D Echo: Not obtained  Dg Chest 2 View  Result Date: 02/01/2019 CLINICAL DATA:  Shortness of breath EXAM: CHEST - 2 VIEW COMPARISON:  01/30/2019 FINDINGS: Small pleural effusions. Cardiomegaly with vascular congestion and mild interstitial edema. Patchy atelectasis left base. Aortic atherosclerosis. No pneumothorax. IMPRESSION: Cardiomegaly with small pleural effusions, vascular congestion and mild interstitial edema. Electronically Signed   By: [name]  Fujinaga M.D.   On: 02/01/2019 21:16   Dg Chest Port 1 View  Result Date: 02/21/2019 CLINICAL DATA:  Enlarged prostate, groin pain.  Shortness of breath EXAM: PORTABLE CHEST 1 VIEW COMPARISON:  02/19/2019 FINDINGS: Cardiomegaly with vascular congestion. Interstitial prominence is increased since prior study concerning for interstitial edema. Lingular atelectasis. No visible effusions or acute bony abnormality. IMPRESSION: Cardiomegaly with vascular congestion and mild suspected interstitial edema. Lingular atelectasis. Electronically Signed   By: [name]  [name] M.D.   On: 02/21/2019 15:55   Dg Chest Port 1 View  Result Date: 02/19/2019 CLINICAL DATA:  Shortness of breath. EXAM: PORTABLE CHEST 1 VIEW COMPARISON:  Multiple prior exams most recently 02/12/2019 FINDINGS: Chronic cardiomegaly. Unchanged mediastinal contours. Left midlung scarring is unchanged. Similar appearance of retrocardiac opacity prior exams. Vascular congestion without pulmonary edema. No pneumothorax or acute airspace disease. IMPRESSION: Unchanged radiographic appearance of the chest with stable cardiomegaly and left midlung scarring. Electronically Signed   By: [name]  Taylor M.D.   On: 02/19/2019 22:49   Dg Chest Port 1 View  Result Date: 02/12/2019 CLINICAL DATA:  Shortness of breath for 6 months. EXAM: PORTABLE CHEST 1 VIEW COMPARISON:  February 10, 2019  FINDINGS: Cardiomegaly. The left retrocardiac region is not well assessed. Scar atelectasis in the lingula. The hila, mediastinum, lungs, and pleura are otherwise normal. IMPRESSION: No acute abnormalities. Electronically Signed   By: Taylor Samavid  Williams III M.D   On: 02/12/2019 16:14   Dg Chest Port 1 View  Result Date: 02/10/2019 CLINICAL DATA:  Shortness of breath EXAM: PORTABLE CHEST 1 VIEW COMPARISON:  02/05/2019 FINDINGS: Cardiomegaly. No frank interstitial edema. Left lung base is obscured, possibly reflecting an trace left pleural effusion. Lingular scarring/atelectasis. No pneumothorax. IMPRESSION: Cardiomegaly. No frank interstitial edema. Possible trace left pleural effusion. Electronically Signed   By: Charline BillsSriyesh  Krishnan M.D.   On: 02/10/2019 01:10   Dg Chest Port 1 View  Result Date: 02/05/2019 CLINICAL DATA:  Shortness of breath EXAM: PORTABLE CHEST 1 VIEW COMPARISON:  February 04, 2019 FINDINGS: There is cardiomegaly with pulmonary venous hypertension. There is atelectatic change in the left mid lung region. There is no frank edema or consolidation. There is a small left pleural effusion. There is aortic atherosclerosis. No adenopathy. No bone lesions. IMPRESSION: Cardiomegaly with pulmonary vascular congestion. Small left pleural effusion. Stable atelectasis left mid lung. No frank edema or consolidation. Aortic Atherosclerosis (ICD10-I70.0). Electronically Signed   By: Bretta BangWilliam  Woodruff III M.D.   On: 02/05/2019 08:17   Dg Chest Port 1 View  Result Date: 02/04/2019 CLINICAL DATA:  Shortness of breath EXAM: PORTABLE CHEST 1 VIEW COMPARISON:  Three days ago FINDINGS: Cardiomegaly. Vascular congestion at the hila. Generalized interstitial coarsening. Band of atelectasis in the left perihilar lung. Stable blunting of the lateral left costophrenic sulcus. IMPRESSION: Stable cardiomegaly and vascular  congestion with small left effusion. Electronically Signed   By: [name]   On: 02/04/2019  05:13   Dg Chest Portable 1 View  Result Date: 01/30/2019 CLINICAL DATA:  Shortness of breath EXAM: PORTABLE CHEST 1 VIEW COMPARISON:  January 25, 2019 FINDINGS: There is cardiomegaly with mild volume overload. There is chronic atelectasis versus scarring in the lingula. There may be small bilateral pleural effusions. There is no pneumothorax. Advanced degenerative changes are noted of the right shoulder. IMPRESSION: Cardiomegaly with mild volume overload. Electronically Signed   By: Katherine Mantle M.D.   On: 01/30/2019 01:37   Dg Chest Port 1 View  Result Date: 01/25/2019 CLINICAL DATA:  Shortness of breath. Chest pain. EXAM: PORTABLE CHEST 1 VIEW COMPARISON:  01/22/2019, 01/19/2019 and 01/16/2019 FINDINGS: Chronic cardiomegaly. Pulmonary vascularity is normal. No infiltrates. Slight haziness at the lung bases may represent tiny effusions. No acute bone abnormality. IMPRESSION: Possible tiny effusions. Chronic cardiomegaly. Electronically Signed   By: Francene Boyers M.D.   On: 01/25/2019 11:48      Subjective: Patient left AMA overnight.   Discharge Exam: Vitals:   02/22/19 1635 02/22/19 2013  BP: (!) 157/114 139/84  Pulse: 99 100  Resp: 17 18  Temp: 97.6 F (36.4 C) (!) 97.3 F (36.3 C)  SpO2: 93% 97%    Patient left AMA overnight.   The results of significant diagnostics from this hospitalization (including imaging, microbiology, ancillary and laboratory) are listed below for reference.     Microbiology: Recent Results (from the past 240 hour([name]))  MRSA PCR Screening     Status: None   Collection Time: 02/21/19  9:00 PM   Specimen: Nasopharyngeal  Result Value Ref Range Status   MRSA by PCR NEGATIVE NEGATIVE Final    Comment:        The GeneXpert MRSA Assay (FDA approved for NASAL specimens only), is one component of a comprehensive MRSA colonization surveillance program. It is not intended to diagnose MRSA infection nor to guide or monitor treatment for MRSA  infections. Performed at [facility], [address] [place], [place] [address]      Labs: BNP (last 3 results) Recent Labs    02/12/19 1549 02/19/19 1830 02/21/19 1438  BNP 924.1* 1,064.4* 905.3*   Basic Metabolic Panel: Recent Labs  Lab 02/19/19 1830 02/21/19 1438 02/22/19 0510  NA 138 137 139  K 3.4* 3.6 3.9  CL 97* 96* 99  CO2 28 28 32  GLUCOSE 304* 337* 237*  BUN 13 11 9   CREATININE 0.87 0.88 0.80  CALCIUM 8.7* 8.9 9.0  MG  --  1.8  --    Liver Function Tests: No results for input([name]): AST, ALT, ALKPHOS, BILITOT, PROT, ALBUMIN in the last 168 hours. No results for input([name]): LIPASE, AMYLASE in the last 168 hours. No results for input([name]): AMMONIA in the last 168 hours. CBC: Recent Labs  Lab 02/19/19 1830 02/21/19 1438 02/22/19 0510  WBC 7.6 8.3 6.1  NEUTROABS 5.9 6.6  --   HGB 8.7* 8.8* 8.5*  HCT 31.8* 32.5* 31.2*  MCV 76.8* 76.7* 76.1*  PLT 358 385 339   Cardiac Enzymes: No results for input([name]): CKTOTAL, CKMB, CKMBINDEX, TROPONINI in the last 168 hours. BNP: Invalid input([name]): POCBNP CBG: Recent Labs  Lab 02/21/19 2205 02/22/19 0637 02/22/19 1117 02/22/19 1611  GLUCAP 348* 219* 250* 239*   D-Dimer No results for input([name]): DDIMER in the last 72 hours. Hgb A1c No results for input([name]): HGBA1C in the  last 72 hours. Lipid Profile No results for input([name]): CHOL, HDL, LDLCALC, TRIG, CHOLHDL, LDLDIRECT in the last 72 hours. Thyroid function studies No results for input([name]): TSH, T4TOTAL, T3FREE, THYROIDAB in the last 72 hours.  Invalid input([name]): FREET3 Anemia work up Recent Labs    02/22/19 1020  VITAMINB12 614  FOLATE 15.3  FERRITIN 13*  TIBC 479*  IRON 14*  RETICCTPCT 1.4   Urinalysis    Component Value Date/Time   COLORURINE YELLOW 02/13/2019 0408   APPEARANCEUR CLEAR 02/13/2019 0408   LABSPEC 1.019 02/13/2019 0408   PHURINE 5.0 02/13/2019 0408   GLUCOSEU 50 (A) 02/13/2019 0408   HGBUR NEGATIVE 02/13/2019 0408   BILIRUBINUR  NEGATIVE 02/13/2019 0408   KETONESUR NEGATIVE 02/13/2019 0408   PROTEINUR 100 (A) 02/13/2019 0408   UROBILINOGEN 1.0 03/14/2015 0610   NITRITE NEGATIVE 02/13/2019 0408   LEUKOCYTESUR NEGATIVE 02/13/2019 0408   Sepsis Labs Invalid input([name]): PROCALCITONIN,  WBC,  LACTICIDVEN   Time coordinating discharge: 25 minutes  SIGNED:  Almon Herculesaye T Gonfa, MD  [facility] 02/23/2019, 5:22 PM  If 7PM-7AM, please contact night-coverage [email or web address] Password [email or web address]

## 2019-02-24 ENCOUNTER — Other Ambulatory Visit: Payer: Self-pay

## 2019-02-24 ENCOUNTER — Emergency Department (HOSPITAL_COMMUNITY)
Admission: EM | Admit: 2019-02-24 | Discharge: 2019-02-24 | Disposition: A | Discharge status: Home / Self Care | Payer: Medicaid Other | Source: Home / Self Care | Attending: Emergency Medicine | Admitting: Emergency Medicine

## 2019-02-24 ENCOUNTER — Emergency Department (HOSPITAL_COMMUNITY)
Admission: EM | Admit: 2019-02-24 | Discharge: 2019-02-24 | Discharge status: Against Medical Advice | Payer: Medicaid Other | Attending: Emergency Medicine | Admitting: Emergency Medicine

## 2019-02-24 DIAGNOSIS — Z5321 Procedure and treatment not carried out due to patient leaving prior to being seen by health care provider: Secondary | ICD-10-CM | POA: Insufficient documentation

## 2019-02-24 DIAGNOSIS — Z7984 Long term (current) use of oral hypoglycemic drugs: Secondary | ICD-10-CM | POA: Insufficient documentation

## 2019-02-24 DIAGNOSIS — F209 Schizophrenia, unspecified: Secondary | ICD-10-CM | POA: Insufficient documentation

## 2019-02-24 DIAGNOSIS — I110 Hypertensive heart disease with heart failure: Secondary | ICD-10-CM | POA: Insufficient documentation

## 2019-02-24 DIAGNOSIS — Z590 Homelessness: Secondary | ICD-10-CM | POA: Insufficient documentation

## 2019-02-24 DIAGNOSIS — F17210 Nicotine dependence, cigarettes, uncomplicated: Secondary | ICD-10-CM | POA: Insufficient documentation

## 2019-02-24 DIAGNOSIS — I5042 Chronic combined systolic (congestive) and diastolic (congestive) heart failure: Secondary | ICD-10-CM | POA: Insufficient documentation

## 2019-02-24 DIAGNOSIS — Z79899 Other long term (current) drug therapy: Secondary | ICD-10-CM | POA: Insufficient documentation

## 2019-02-24 DIAGNOSIS — E119 Type 2 diabetes mellitus without complications: Secondary | ICD-10-CM | POA: Insufficient documentation

## 2019-02-24 DIAGNOSIS — R609 Edema, unspecified: Secondary | ICD-10-CM

## 2019-02-24 DIAGNOSIS — N50819 Testicular pain, unspecified: Secondary | ICD-10-CM | POA: Diagnosis present

## 2019-02-24 DIAGNOSIS — R600 Localized edema: Secondary | ICD-10-CM | POA: Insufficient documentation

## 2019-02-24 DIAGNOSIS — K429 Umbilical hernia without obstruction or gangrene: Secondary | ICD-10-CM | POA: Insufficient documentation

## 2019-02-24 MED ORDER — ACETAMINOPHEN 325 MG PO TABS
325.0000 mg | ORAL_TABLET | Freq: Once | ORAL | Status: AC
  Administered 2019-02-24: 14:00:00 325 mg via ORAL
  Filled 2019-02-24: qty 1

## 2019-02-24 MED ORDER — FUROSEMIDE 20 MG PO TABS
80.0000 mg | ORAL_TABLET | Freq: Once | ORAL | Status: AC
  Administered 2019-02-24: 80 mg via ORAL
  Filled 2019-02-24: qty 4

## 2019-02-24 NOTE — ED Notes (Signed)
Pt asks to be pushed by phones so he can call ride. After I push him he wants me to push him outside. I tel lhim if he goes outside then he wont be seen. He starts yelling and sceaming.

## 2019-02-24 NOTE — Discharge Instructions (Addendum)
Take your medications as prescribed. Follow up with your doctor.

## 2019-02-24 NOTE — ED Triage Notes (Signed)
Pt arrives with chronic leg and groin swelling. No distress

## 2019-02-24 NOTE — ED Triage Notes (Signed)
Pt here for testicle swelling and testicle pain. Pt says been going on for a long time

## 2019-02-24 NOTE — ED Notes (Signed)
Pt kept wanting to leave and be pushed to the bus stop. Tech asked him to staY but insisted on leaving. Pt is at bus stop.

## 2019-02-24 NOTE — ED Provider Notes (Signed)
[facility] EMERGENCY DEPARTMENT Provider Note   CSN: [id number] Arrival date & time: 02/24/19  1146     History   Chief Complaint Chief Complaint  Patient presents with  . Leg Swelling    HPI [name] is a 58 y.o. male.     58yo male with history of CHF, diabetes, schizophrenia, HTN, substance abuse, presents with complaint of chronic body swelling to his abdomen, back, genitals, chronic SHOB. Patient has been seen 120 times in this ER in the past 6 months. No new complaints today.      Past Medical History:  Diagnosis Date  . Chronic foot pain   . Cocaine abuse ([place])   . Diabetes mellitus without complication ([place])   . Hepatitis C    unsure   . Homelessness   . Hypertension   . Neuropathy   . Polysubstance abuse ([place])   . Schizophrenia ([place])   . Sleep apnea   . Systolic and diastolic CHF, chronic [facility])     Patient Active Problem List   Diagnosis Date Noted  . CHF (congestive heart failure), NYHA class III, acute on chronic, combined ([place]) 02/02/2019  . Scrotal edema 01/30/2019  . Combined systolic and diastolic heart failure ([place]) 01/19/2019  . Chest pain 01/16/2019  . 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  . Medically noncompliant   . Acute on chronic systolic CHF (congestive heart failure) ([place]) 01/02/2019  . Acute on chronic combined systolic (congestive) and diastolic (congestive) heart failure ([place]) 01/02/2019  . Acute on chronic systolic heart failure ([place]) 12/26/2018  . Mobitz type II atrioventricular block 12/25/2018  . Acute exacerbation of CHF (congestive heart failure) ([place]) 12/22/2018  . Acute on chronic respiratory failure with hypoxia ([place]) 12/22/2018  . CHF exacerbation ([place]) 12/19/2018  . Acute CHF (congestive heart failure) ([place]) 12/16/2018  . Diabetes mellitus without complication ([place]) 53/97/6734  . Acute on chronic systolic (congestive) heart failure ([place]) 12/11/2018  . Abdominal pain 12/11/2018  . Nausea vomiting and  diarrhea 12/11/2018  . Microcytic anemia 12/11/2018  . Evaluation by psychiatric service required   . MDD (major depressive disorder), severe ([place]) 11/25/2018  . Pressure injury of skin 11/08/2018  . Elevated troponin 10/18/2018  . HLD (hyperlipidemia) 10/18/2018  . Anxiety 10/18/2018  . CHF (congestive heart failure), NYHA class II, acute on chronic, combined ([place]) 10/18/2018  . Hypertensive urgency 10/12/2018  . Mild renal insufficiency 10/12/2018  . Cellulitis 10/12/2018  . Penile cellulitis   . Adjustment disorder with mixed disturbance of emotions and conduct   . Sleep apnea 10/03/2018  . Hypokalemia 09/17/2018  . Scrotal swelling 09/17/2018  . Urinary hesitancy   . Constipation   . Acute on chronic combined systolic and diastolic CHF (congestive heart failure) ([place]) 08/25/2018  . Acute respiratory failure with hypoxia ([place]) 08/25/2018  . Tobacco use 08/18/2018  . Homelessness 08/08/2018  . Smoker 08/08/2018  . Prostate enlargement 03/16/2018  . Aortic atherosclerosis ([place]) 03/16/2018  . 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 Cocaine abuse with cocaine-induced mood disorder ([place]) 09/18/2017  . Chronic foot pain   . Schizoaffective disorder, bipolar type ([place]) 09/30/2016  . Substance induced mood disorder ([place]) 03/13/2015  . Schizophrenia, paranoid type ([place]) 01/17/2015  . Suicidal ideation   . Drug hallucinosis ([place]) 10/08/2014  . Chronic paranoid schizophrenia ([place]) 09/07/2014  . 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 Urinary retention   . Cocaine use disorder, severe, dependence ([place])   .  Essential hypertension 03/28/2013  . Type 2 diabetes mellitus ([facility]) 03/15/2013    Past Surgical History:  Procedure Laterality Date  . MULTIPLE TOOTH EXTRACTIONS          Home Medications    Prior to Admission medications   Medication Sig Start Date End Date Taking? Authorizing Provider   furosemide (LASIX) 40 MG tablet Take 1 tablet (40 mg total) by mouth 2 (two) times daily. 02/17/19 03/19/19  Harlene SaltsMorelli, Brandon A, PA-C  glipiZIDE (GLUCOTROL) 5 MG tablet Take 0.5 tablets (2.5 mg total) by mouth 2 (two) times daily before a meal. 01/01/19   Rai, Ripudeep K, MD  Lidocaine, Anorectal, 5 % CREA Apply to sore on penis as needed for pain. 02/20/19   Molpus, John, MD    Family History Family History  Problem Relation Age of Onset  . Hypertension Other   . Diabetes Other     Social History Social History   Tobacco Use  . Smoking status: Current Every Day Smoker    Packs/day: 1.00    Years: 20.00    Pack years: 20.00    Types: Cigarettes  . Smokeless tobacco: Current User  Substance Use Topics  . Alcohol use: Yes    Comment: Daily Drinker   . Drug use: Yes    Frequency: 7.0 times per week    Types: "Crack" cocaine, Cocaine, Marijuana    Comment: Crack     Allergies   Haldol [haloperidol]   Review of Systems Review of Systems  Constitutional: Negative for fever.  Cardiovascular: Negative for chest pain.  Genitourinary: Positive for penile swelling and scrotal swelling.  Musculoskeletal: Positive for back pain.  Skin: Negative for wound.  Allergic/Immunologic: Positive for immunocompromised state.  All other systems reviewed and are negative.    Physical Exam Updated Vital Signs BP (!) 170/99 (BP Location: Right Arm)   Pulse 90   Temp 98.1 F (36.7 C) (Oral)   Resp 18   SpO2 100%   Physical Exam Vitals signs and nursing note reviewed.  Constitutional:      General: He is not in acute distress.    Appearance: He is well-developed. He is not diaphoretic.  HENT:     Head: Normocephalic and atraumatic.  Cardiovascular:     Rate and Rhythm: Normal rate and regular rhythm.  Pulmonary:     Effort: Pulmonary effort is normal.     Breath sounds: Normal breath sounds.  Abdominal:     Hernia: A hernia is present.    Musculoskeletal:     Right lower leg:  No edema.     Left lower leg: No edema.  Skin:    General: Skin is warm and dry.     Findings: No erythema or rash.     Comments: Pitting edema to the lower back  Neurological:     Mental Status: He is alert and oriented to person, place, and time.  Psychiatric:        Behavior: Behavior normal.      ED Treatments / Results  Labs (all labs ordered are listed, but only abnormal results are displayed) Labs Reviewed - No data to display  EKG None  Radiology No results found.  Procedures Procedures (including critical care time)  Medications Ordered in ED Medications  furosemide (LASIX) tablet 80 mg (80 mg Oral Given 02/24/19 1335)  acetaminophen (TYLENOL) tablet 325 mg (325 mg Oral Given 02/24/19 1343)     Initial Impression / Assessment and Plan / ED Course  I have reviewed the triage vital signs and the nursing notes.  Pertinent labs & imaging results that were available during my care of the patient were reviewed by me and considered in my medical decision making (see chart for details).  Clinical Course as of Feb 23 1400  Wed Feb 24, 2019  1400 58yo male with complaint of chronic body swelling, no new symptoms today. Known compliance problems with patient, recently admitted to the hospital and left AMA. Patient is at baseline today, reviewed labs from earlier in the week, patient will be given dose of Lasix and a Tylenol and discharged. Patient is agreeable with plan of care.    [LM]    Clinical Course User Index [LM] Jeannie Fend, PA-C      Final Clinical Impressions(s) / ED Diagnoses   Final diagnoses:  Peripheral edema    ED Discharge Orders    None       Alden Hipp 02/24/19 1402    Raeford Razor, MD 02/28/2019 1136

## 2019-02-25 ENCOUNTER — Other Ambulatory Visit: Payer: Self-pay

## 2019-02-25 ENCOUNTER — Encounter: Payer: Self-pay | Admitting: *Deleted

## 2019-02-25 ENCOUNTER — Inpatient Hospital Stay (HOSPITAL_COMMUNITY)
Admission: EM | Admit: 2019-02-25 | Discharge: 2019-04-01 | DRG: 291 | Disposition: E | Payer: Medicare Other | Attending: Emergency Medicine | Admitting: Emergency Medicine

## 2019-02-25 ENCOUNTER — Emergency Department (HOSPITAL_COMMUNITY)
Admission: EM | Admit: 2019-02-25 | Discharge: 2019-02-25 | Disposition: A | Discharge status: Home / Self Care | Payer: Medicaid Other | Attending: Emergency Medicine | Admitting: Emergency Medicine

## 2019-02-25 ENCOUNTER — Encounter (HOSPITAL_COMMUNITY): Payer: Self-pay

## 2019-02-25 DIAGNOSIS — I110 Hypertensive heart disease with heart failure: Secondary | ICD-10-CM | POA: Diagnosis not present

## 2019-02-25 DIAGNOSIS — R4020 Unspecified coma: Secondary | ICD-10-CM | POA: Diagnosis not present

## 2019-02-25 DIAGNOSIS — F17210 Nicotine dependence, cigarettes, uncomplicated: Secondary | ICD-10-CM | POA: Diagnosis present

## 2019-02-25 DIAGNOSIS — F1414 Cocaine abuse with cocaine-induced mood disorder: Secondary | ICD-10-CM | POA: Diagnosis present

## 2019-02-25 DIAGNOSIS — R600 Localized edema: Secondary | ICD-10-CM | POA: Insufficient documentation

## 2019-02-25 DIAGNOSIS — R569 Unspecified convulsions: Secondary | ICD-10-CM

## 2019-02-25 DIAGNOSIS — J9600 Acute respiratory failure, unspecified whether with hypoxia or hypercapnia: Secondary | ICD-10-CM

## 2019-02-25 DIAGNOSIS — I469 Cardiac arrest, cause unspecified: Secondary | ICD-10-CM

## 2019-02-25 DIAGNOSIS — N5089 Other specified disorders of the male genital organs: Secondary | ICD-10-CM | POA: Diagnosis present

## 2019-02-25 DIAGNOSIS — E119 Type 2 diabetes mellitus without complications: Secondary | ICD-10-CM | POA: Insufficient documentation

## 2019-02-25 DIAGNOSIS — Z9114 Patient's other noncompliance with medication regimen: Secondary | ICD-10-CM | POA: Diagnosis not present

## 2019-02-25 DIAGNOSIS — B182 Chronic viral hepatitis C: Secondary | ICD-10-CM | POA: Diagnosis present

## 2019-02-25 DIAGNOSIS — E785 Hyperlipidemia, unspecified: Secondary | ICD-10-CM | POA: Diagnosis present

## 2019-02-25 DIAGNOSIS — G40911 Epilepsy, unspecified, intractable, with status epilepticus: Secondary | ICD-10-CM | POA: Diagnosis not present

## 2019-02-25 DIAGNOSIS — E11649 Type 2 diabetes mellitus with hypoglycemia without coma: Secondary | ICD-10-CM | POA: Diagnosis not present

## 2019-02-25 DIAGNOSIS — N179 Acute kidney failure, unspecified: Secondary | ICD-10-CM | POA: Diagnosis not present

## 2019-02-25 DIAGNOSIS — R2243 Localized swelling, mass and lump, lower limb, bilateral: Secondary | ICD-10-CM | POA: Diagnosis present

## 2019-02-25 DIAGNOSIS — Z833 Family history of diabetes mellitus: Secondary | ICD-10-CM

## 2019-02-25 DIAGNOSIS — Z888 Allergy status to other drugs, medicaments and biological substances status: Secondary | ICD-10-CM

## 2019-02-25 DIAGNOSIS — I5042 Chronic combined systolic (congestive) and diastolic (congestive) heart failure: Secondary | ICD-10-CM | POA: Diagnosis not present

## 2019-02-25 DIAGNOSIS — Z5900 Homelessness unspecified: Secondary | ICD-10-CM

## 2019-02-25 DIAGNOSIS — Z20828 Contact with and (suspected) exposure to other viral communicable diseases: Secondary | ICD-10-CM | POA: Diagnosis present

## 2019-02-25 DIAGNOSIS — R140 Abdominal distension (gaseous): Secondary | ICD-10-CM

## 2019-02-25 DIAGNOSIS — J810 Acute pulmonary edema: Secondary | ICD-10-CM | POA: Diagnosis present

## 2019-02-25 DIAGNOSIS — N50819 Testicular pain, unspecified: Secondary | ICD-10-CM | POA: Diagnosis present

## 2019-02-25 DIAGNOSIS — I10 Essential (primary) hypertension: Secondary | ICD-10-CM | POA: Diagnosis present

## 2019-02-25 DIAGNOSIS — Z9119 Patient's noncompliance with other medical treatment and regimen: Secondary | ICD-10-CM

## 2019-02-25 DIAGNOSIS — I493 Ventricular premature depolarization: Secondary | ICD-10-CM | POA: Diagnosis not present

## 2019-02-25 DIAGNOSIS — Z79899 Other long term (current) drug therapy: Secondary | ICD-10-CM

## 2019-02-25 DIAGNOSIS — Z7984 Long term (current) use of oral hypoglycemic drugs: Secondary | ICD-10-CM

## 2019-02-25 DIAGNOSIS — F200 Paranoid schizophrenia: Secondary | ICD-10-CM | POA: Diagnosis present

## 2019-02-25 DIAGNOSIS — E872 Acidosis: Secondary | ICD-10-CM | POA: Diagnosis not present

## 2019-02-25 DIAGNOSIS — Z452 Encounter for adjustment and management of vascular access device: Secondary | ICD-10-CM

## 2019-02-25 DIAGNOSIS — F329 Major depressive disorder, single episode, unspecified: Secondary | ICD-10-CM | POA: Diagnosis present

## 2019-02-25 DIAGNOSIS — Z515 Encounter for palliative care: Secondary | ICD-10-CM | POA: Diagnosis not present

## 2019-02-25 DIAGNOSIS — F14129 Cocaine abuse with intoxication, unspecified: Secondary | ICD-10-CM | POA: Diagnosis present

## 2019-02-25 DIAGNOSIS — I5082 Biventricular heart failure: Secondary | ICD-10-CM | POA: Diagnosis present

## 2019-02-25 DIAGNOSIS — Z8249 Family history of ischemic heart disease and other diseases of the circulatory system: Secondary | ICD-10-CM

## 2019-02-25 DIAGNOSIS — W1830XA Fall on same level, unspecified, initial encounter: Secondary | ICD-10-CM | POA: Diagnosis present

## 2019-02-25 DIAGNOSIS — G931 Anoxic brain damage, not elsewhere classified: Secondary | ICD-10-CM | POA: Diagnosis present

## 2019-02-25 DIAGNOSIS — Z720 Tobacco use: Secondary | ICD-10-CM | POA: Diagnosis present

## 2019-02-25 DIAGNOSIS — E876 Hypokalemia: Secondary | ICD-10-CM | POA: Diagnosis not present

## 2019-02-25 DIAGNOSIS — Z66 Do not resuscitate: Secondary | ICD-10-CM | POA: Diagnosis not present

## 2019-02-25 DIAGNOSIS — I5043 Acute on chronic combined systolic (congestive) and diastolic (congestive) heart failure: Secondary | ICD-10-CM | POA: Diagnosis present

## 2019-02-25 DIAGNOSIS — R579 Shock, unspecified: Secondary | ICD-10-CM | POA: Diagnosis present

## 2019-02-25 DIAGNOSIS — R570 Cardiogenic shock: Secondary | ICD-10-CM | POA: Diagnosis not present

## 2019-02-25 DIAGNOSIS — J9621 Acute and chronic respiratory failure with hypoxia: Secondary | ICD-10-CM | POA: Diagnosis present

## 2019-02-25 DIAGNOSIS — R0602 Shortness of breath: Secondary | ICD-10-CM

## 2019-02-25 DIAGNOSIS — G8929 Other chronic pain: Secondary | ICD-10-CM | POA: Diagnosis present

## 2019-02-25 DIAGNOSIS — E1140 Type 2 diabetes mellitus with diabetic neuropathy, unspecified: Secondary | ICD-10-CM | POA: Diagnosis present

## 2019-02-25 DIAGNOSIS — G4733 Obstructive sleep apnea (adult) (pediatric): Secondary | ICD-10-CM | POA: Diagnosis present

## 2019-02-25 DIAGNOSIS — D72829 Elevated white blood cell count, unspecified: Secondary | ICD-10-CM | POA: Diagnosis not present

## 2019-02-25 LAB — NOVEL CORONAVIRUS, NAA (HOSP ORDER, SEND-OUT TO REF LAB; TAT 18-24 HRS): SARS-CoV-2, NAA: NOT DETECTED

## 2019-02-25 MED ORDER — ACETAMINOPHEN 325 MG PO TABS: 650.0000 mg | ORAL_TABLET | Freq: Three times a day (TID) | ORAL | 0 refills | Status: AC | PRN

## 2019-02-25 MED ORDER — ACETAMINOPHEN 325 MG PO TABS
650.0000 mg | ORAL_TABLET | Freq: Once | ORAL | Status: AC
  Administered 2019-02-25: 13:00:00 650 mg via ORAL
  Filled 2019-02-25: qty 2

## 2019-02-25 MED ORDER — GLIPIZIDE 2.5 MG HALF TABLET
2.5000 mg | ORAL_TABLET | Freq: Every day | ORAL | Status: DC
  Administered 2019-02-25: 13:00:00 2.5 mg via ORAL
  Filled 2019-02-25: qty 1

## 2019-02-25 MED ORDER — FUROSEMIDE 40 MG PO TABS
40.0000 mg | ORAL_TABLET | Freq: Once | ORAL | Status: AC
  Administered 2019-02-25: 13:00:00 40 mg via ORAL
  Filled 2019-02-25: qty 1

## 2019-02-25 MED ORDER — GLIPIZIDE 5 MG PO TABS: 2.5000 mg | ORAL_TABLET | Freq: Two times a day (BID) | ORAL | 0 refills | Status: AC

## 2019-02-25 MED ORDER — FUROSEMIDE 40 MG PO TABS: 40.0000 mg | ORAL_TABLET | Freq: Two times a day (BID) | ORAL | Status: DC

## 2019-02-25 MED ORDER — GLIPIZIDE 2.5 MG HALF TABLET: 2.5000 mg | ORAL_TABLET | Freq: Two times a day (BID) | ORAL | Status: DC

## 2019-02-25 MED ORDER — FUROSEMIDE 40 MG PO TABS: 40.0000 mg | ORAL_TABLET | Freq: Two times a day (BID) | ORAL | 0 refills | Status: AC

## 2019-02-25 NOTE — Progress Notes (Signed)
0830:  [facility] consulted regarding pt sitting at bus stop covered in feces asleep.  0845:  ED team brought pt in for shower and clean clothes.  0915:  EDCM provided taxi voucher to [employer] as requested by pt as he is unable to walk from bus stop to his destination.

## 2019-02-25 NOTE — ED Notes (Signed)
Pt sleeping in the lobby; per previous shift pt bought himself a Zinger cake and orange juice.

## 2019-02-25 NOTE — Discharge Instructions (Addendum)
You need to follow-up with all of your outpatient providers as has been recommended innumerable times. The ER is not the appropriate place to come for routine medical care or if you do not have another place to stay.

## 2019-02-25 NOTE — ED Triage Notes (Signed)
bil leg pain and swelling seen here yesterday for the same and complains of shortness of breath. 86% on 2L [place] now for low sats.

## 2019-02-25 NOTE — ED Triage Notes (Signed)
Pt arrives [facility] for eval of fall in [employer] parking [place], no signs of trauma or injury. 100% on 2L. Generalized edema, scrotal swelling baseline.

## 2019-02-26 ENCOUNTER — Emergency Department (HOSPITAL_COMMUNITY): Payer: Medicare Other

## 2019-02-26 DIAGNOSIS — I351 Nonrheumatic aortic (valve) insufficiency: Secondary | ICD-10-CM | POA: Diagnosis not present

## 2019-02-26 DIAGNOSIS — I5023 Acute on chronic systolic (congestive) heart failure: Secondary | ICD-10-CM | POA: Diagnosis not present

## 2019-02-26 DIAGNOSIS — R600 Localized edema: Secondary | ICD-10-CM | POA: Diagnosis present

## 2019-02-26 DIAGNOSIS — G40901 Epilepsy, unspecified, not intractable, with status epilepticus: Secondary | ICD-10-CM | POA: Diagnosis not present

## 2019-02-26 DIAGNOSIS — I5082 Biventricular heart failure: Secondary | ICD-10-CM | POA: Diagnosis present

## 2019-02-26 DIAGNOSIS — I469 Cardiac arrest, cause unspecified: Secondary | ICD-10-CM | POA: Diagnosis not present

## 2019-02-26 DIAGNOSIS — E876 Hypokalemia: Secondary | ICD-10-CM | POA: Diagnosis not present

## 2019-02-26 DIAGNOSIS — E119 Type 2 diabetes mellitus without complications: Secondary | ICD-10-CM | POA: Diagnosis not present

## 2019-02-26 DIAGNOSIS — I10 Essential (primary) hypertension: Secondary | ICD-10-CM | POA: Diagnosis not present

## 2019-02-26 DIAGNOSIS — R569 Unspecified convulsions: Secondary | ICD-10-CM | POA: Diagnosis not present

## 2019-02-26 DIAGNOSIS — J9621 Acute and chronic respiratory failure with hypoxia: Secondary | ICD-10-CM | POA: Diagnosis present

## 2019-02-26 DIAGNOSIS — G40911 Epilepsy, unspecified, intractable, with status epilepticus: Secondary | ICD-10-CM | POA: Diagnosis not present

## 2019-02-26 DIAGNOSIS — I5043 Acute on chronic combined systolic (congestive) and diastolic (congestive) heart failure: Secondary | ICD-10-CM | POA: Diagnosis present

## 2019-02-26 DIAGNOSIS — E11649 Type 2 diabetes mellitus with hypoglycemia without coma: Secondary | ICD-10-CM | POA: Diagnosis not present

## 2019-02-26 DIAGNOSIS — R4020 Unspecified coma: Secondary | ICD-10-CM | POA: Diagnosis not present

## 2019-02-26 DIAGNOSIS — Z66 Do not resuscitate: Secondary | ICD-10-CM | POA: Diagnosis not present

## 2019-02-26 DIAGNOSIS — Z7189 Other specified counseling: Secondary | ICD-10-CM | POA: Diagnosis not present

## 2019-02-26 DIAGNOSIS — N5089 Other specified disorders of the male genital organs: Secondary | ICD-10-CM | POA: Diagnosis present

## 2019-02-26 DIAGNOSIS — E872 Acidosis: Secondary | ICD-10-CM | POA: Diagnosis not present

## 2019-02-26 DIAGNOSIS — Z20828 Contact with and (suspected) exposure to other viral communicable diseases: Secondary | ICD-10-CM | POA: Diagnosis present

## 2019-02-26 DIAGNOSIS — Z515 Encounter for palliative care: Secondary | ICD-10-CM | POA: Diagnosis not present

## 2019-02-26 DIAGNOSIS — G931 Anoxic brain damage, not elsewhere classified: Secondary | ICD-10-CM | POA: Diagnosis not present

## 2019-02-26 DIAGNOSIS — J9601 Acute respiratory failure with hypoxia: Secondary | ICD-10-CM | POA: Diagnosis not present

## 2019-02-26 DIAGNOSIS — E785 Hyperlipidemia, unspecified: Secondary | ICD-10-CM | POA: Diagnosis present

## 2019-02-26 DIAGNOSIS — F14129 Cocaine abuse with intoxication, unspecified: Secondary | ICD-10-CM | POA: Diagnosis present

## 2019-02-26 DIAGNOSIS — G8929 Other chronic pain: Secondary | ICD-10-CM | POA: Diagnosis present

## 2019-02-26 DIAGNOSIS — F200 Paranoid schizophrenia: Secondary | ICD-10-CM | POA: Diagnosis present

## 2019-02-26 DIAGNOSIS — N179 Acute kidney failure, unspecified: Secondary | ICD-10-CM | POA: Diagnosis not present

## 2019-02-26 DIAGNOSIS — B182 Chronic viral hepatitis C: Secondary | ICD-10-CM | POA: Diagnosis present

## 2019-02-26 DIAGNOSIS — I110 Hypertensive heart disease with heart failure: Secondary | ICD-10-CM | POA: Diagnosis present

## 2019-02-26 DIAGNOSIS — J810 Acute pulmonary edema: Secondary | ICD-10-CM | POA: Diagnosis not present

## 2019-02-26 DIAGNOSIS — R570 Cardiogenic shock: Secondary | ICD-10-CM | POA: Diagnosis not present

## 2019-02-26 DIAGNOSIS — D72829 Elevated white blood cell count, unspecified: Secondary | ICD-10-CM | POA: Diagnosis not present

## 2019-02-26 DIAGNOSIS — R0609 Other forms of dyspnea: Secondary | ICD-10-CM | POA: Diagnosis not present

## 2019-02-26 DIAGNOSIS — F17210 Nicotine dependence, cigarettes, uncomplicated: Secondary | ICD-10-CM | POA: Diagnosis present

## 2019-02-26 DIAGNOSIS — W1830XA Fall on same level, unspecified, initial encounter: Secondary | ICD-10-CM | POA: Diagnosis present

## 2019-02-26 DIAGNOSIS — R579 Shock, unspecified: Secondary | ICD-10-CM | POA: Diagnosis not present

## 2019-02-26 DIAGNOSIS — F1414 Cocaine abuse with cocaine-induced mood disorder: Secondary | ICD-10-CM | POA: Diagnosis present

## 2019-02-26 LAB — RAPID URINE DRUG SCREEN, HOSP PERFORMED
Amphetamines: NOT DETECTED
Barbiturates: NOT DETECTED
Benzodiazepines: NOT DETECTED
Cocaine: POSITIVE — AB
Opiates: NOT DETECTED
Tetrahydrocannabinol: NOT DETECTED

## 2019-02-26 LAB — CBC WITH DIFFERENTIAL/PLATELET
Abs Immature Granulocytes: 0.03 10*3/uL (ref 0.00–0.07)
Basophils Absolute: 0.1 10*3/uL (ref 0.0–0.1)
Basophils Relative: 1 %
Eosinophils Absolute: 0 10*3/uL (ref 0.0–0.5)
Eosinophils Relative: 0 %
HCT: 31.7 % — ABNORMAL LOW (ref 39.0–52.0)
Hemoglobin: 8.8 g/dL — ABNORMAL LOW (ref 13.0–17.0)
Immature Granulocytes: 0 %
Lymphocytes Relative: 12 %
Lymphs Abs: 0.9 10*3/uL (ref 0.7–4.0)
MCH: 21 pg — ABNORMAL LOW (ref 26.0–34.0)
MCHC: 27.8 g/dL — ABNORMAL LOW (ref 30.0–36.0)
MCV: 75.7 fL — ABNORMAL LOW (ref 80.0–100.0)
Monocytes Absolute: 0.9 10*3/uL (ref 0.1–1.0)
Monocytes Relative: 11 %
Neutro Abs: 5.9 10*3/uL (ref 1.7–7.7)
Neutrophils Relative %: 76 %
Platelets: 280 10*3/uL (ref 150–400)
RBC: 4.19 MIL/uL — ABNORMAL LOW (ref 4.22–5.81)
RDW: 21.5 % — ABNORMAL HIGH (ref 11.5–15.5)
WBC: 7.8 10*3/uL (ref 4.0–10.5)
nRBC: 0.4 % — ABNORMAL HIGH (ref 0.0–0.2)

## 2019-02-26 LAB — BASIC METABOLIC PANEL
Anion gap: 11 (ref 5–15)
BUN: 11 mg/dL (ref 6–20)
CO2: 30 mmol/L (ref 22–32)
Calcium: 8.7 mg/dL — ABNORMAL LOW (ref 8.9–10.3)
Chloride: 100 mmol/L (ref 98–111)
Creatinine, Ser: 0.95 mg/dL (ref 0.61–1.24)
GFR calc Af Amer: >60 mL/min (ref >60)
GFR calc non Af Amer: >60 mL/min (ref >60)
Glucose, Bld: 181 mg/dL — ABNORMAL HIGH (ref 70–99)
Potassium: 3.9 mmol/L (ref 3.5–5.1)
Sodium: 141 mmol/L (ref 135–145)

## 2019-02-26 LAB — GLUCOSE, CAPILLARY
Glucose-Capillary: 223 mg/dL — ABNORMAL HIGH (ref 70–99)
Glucose-Capillary: 161 mg/dL — ABNORMAL HIGH (ref 70–99)

## 2019-02-26 LAB — BRAIN NATRIURETIC PEPTIDE: B Natriuretic Peptide: 1524.7 pg/mL — ABNORMAL HIGH (ref 0.0–100.0)

## 2019-02-26 LAB — SARS CORONAVIRUS 2 (TAT 6-24 HRS): SARS Coronavirus 2: NEGATIVE

## 2019-02-26 MED ORDER — SODIUM CHLORIDE 0.9 % IV SOLN: 250.0000 mL | INTRAVENOUS | Status: DC | PRN

## 2019-02-26 MED ORDER — SODIUM CHLORIDE 0.9% FLUSH
3.0000 mL | Freq: Two times a day (BID) | INTRAVENOUS | Status: DC
  Administered 2019-02-26 – 2019-03-09 (×19): 3 mL via INTRAVENOUS

## 2019-02-26 MED ORDER — SODIUM CHLORIDE 0.9% FLUSH: 3.0000 mL | INTRAVENOUS | Status: DC | PRN

## 2019-02-26 MED ORDER — INSULIN GLARGINE 100 UNIT/ML ~~LOC~~ SOLN
30.0000 [IU] | Freq: Two times a day (BID) | SUBCUTANEOUS | Status: DC
  Administered 2019-02-26 – 2019-02-27 (×2): 30 [IU] via SUBCUTANEOUS
  Filled 2019-02-26 (×3): qty 0.3

## 2019-02-26 MED ORDER — ACETAMINOPHEN 325 MG PO TABS
650.0000 mg | ORAL_TABLET | ORAL | Status: DC | PRN
  Administered 2019-02-26 – 2019-03-08 (×16): 650 mg via ORAL
  Filled 2019-02-26 (×16): qty 2

## 2019-02-26 MED ORDER — ARIPIPRAZOLE 10 MG PO TABS
10.0000 mg | ORAL_TABLET | Freq: Every day | ORAL | Status: DC
  Administered 2019-02-26 – 2019-03-01 (×4): 10 mg via ORAL
  Filled 2019-02-26: qty 2
  Filled 2019-02-26: qty 1
  Filled 2019-02-26 (×3): qty 2
  Filled 2019-02-26 (×3): qty 1

## 2019-02-26 MED ORDER — ASPIRIN EC 81 MG PO TBEC
81.0000 mg | DELAYED_RELEASE_TABLET | Freq: Every day | ORAL | Status: DC
  Administered 2019-02-26 – 2019-03-01 (×4): 81 mg via ORAL
  Filled 2019-02-26 (×4): qty 1

## 2019-02-26 MED ORDER — CARBAMAZEPINE 200 MG PO TABS
100.0000 mg | ORAL_TABLET | Freq: Two times a day (BID) | ORAL | Status: DC
  Administered 2019-02-26 – 2019-03-01 (×7): 100 mg via ORAL
  Filled 2019-02-26 (×8): qty 0.5

## 2019-02-26 MED ORDER — FUROSEMIDE 10 MG/ML IJ SOLN
40.0000 mg | Freq: Two times a day (BID) | INTRAMUSCULAR | Status: DC
  Filled 2019-02-26: qty 4

## 2019-02-26 MED ORDER — NICOTINE 14 MG/24HR TD PT24
14.0000 mg | MEDICATED_PATCH | Freq: Every day | TRANSDERMAL | Status: DC
  Administered 2019-02-26 – 2019-03-07 (×10): 14 mg via TRANSDERMAL
  Filled 2019-02-26 (×10): qty 1

## 2019-02-26 MED ORDER — INSULIN ASPART 100 UNIT/ML ~~LOC~~ SOLN
3.0000 [IU] | Freq: Three times a day (TID) | SUBCUTANEOUS | Status: DC
  Administered 2019-02-26 – 2019-03-01 (×10): 3 [IU] via SUBCUTANEOUS

## 2019-02-26 MED ORDER — ACETAMINOPHEN 325 MG PO TABS
325.0000 mg | ORAL_TABLET | Freq: Once | ORAL | Status: AC
  Administered 2019-02-26: 02:00:00 325 mg via ORAL
  Filled 2019-02-26: qty 1

## 2019-02-26 MED ORDER — ENOXAPARIN SODIUM 40 MG/0.4ML ~~LOC~~ SOLN
40.0000 mg | SUBCUTANEOUS | Status: DC
  Administered 2019-02-26 – 2019-03-01 (×4): 40 mg via SUBCUTANEOUS
  Filled 2019-02-26 (×4): qty 0.4

## 2019-02-26 MED ORDER — ACETAMINOPHEN 325 MG PO TABS
325.0000 mg | ORAL_TABLET | Freq: Once | ORAL | Status: AC
  Administered 2019-02-26: 09:00:00 325 mg via ORAL
  Filled 2019-02-26: qty 1

## 2019-02-26 MED ORDER — ONDANSETRON HCL 4 MG/2ML IJ SOLN: 4.0000 mg | Freq: Four times a day (QID) | INTRAMUSCULAR | Status: DC | PRN

## 2019-02-26 MED ORDER — INSULIN ASPART 100 UNIT/ML ~~LOC~~ SOLN
0.0000 [IU] | Freq: Three times a day (TID) | SUBCUTANEOUS | Status: DC
  Administered 2019-02-26: 19:00:00 5 [IU] via SUBCUTANEOUS
  Administered 2019-02-27: 12:00:00 8 [IU] via SUBCUTANEOUS
  Administered 2019-02-27 (×2): 3 [IU] via SUBCUTANEOUS
  Administered 2019-02-28: 12:00:00 8 [IU] via SUBCUTANEOUS
  Administered 2019-02-28: 3 [IU] via SUBCUTANEOUS
  Administered 2019-02-28: 2 [IU] via SUBCUTANEOUS
  Administered 2019-03-01 (×2): 5 [IU] via SUBCUTANEOUS
  Administered 2019-03-01: 3 [IU] via SUBCUTANEOUS

## 2019-02-26 MED ORDER — FUROSEMIDE 20 MG PO TABS
40.0000 mg | ORAL_TABLET | Freq: Once | ORAL | Status: AC
  Administered 2019-02-26: 40 mg via ORAL
  Filled 2019-02-26: qty 2

## 2019-02-26 MED ORDER — FUROSEMIDE 10 MG/ML IJ SOLN
40.0000 mg | Freq: Three times a day (TID) | INTRAMUSCULAR | Status: DC
  Administered 2019-02-26 – 2019-02-27 (×3): 40 mg via INTRAVENOUS
  Filled 2019-02-26 (×3): qty 4

## 2019-02-26 MED ORDER — LISINOPRIL 5 MG PO TABS
5.0000 mg | ORAL_TABLET | Freq: Every day | ORAL | Status: DC
  Administered 2019-02-26 – 2019-03-01 (×4): 5 mg via ORAL
  Filled 2019-02-26 (×5): qty 1

## 2019-02-26 MED ORDER — FUROSEMIDE 10 MG/ML IJ SOLN
40.0000 mg | Freq: Once | INTRAMUSCULAR | Status: AC
  Administered 2019-02-26: 40 mg via INTRAVENOUS
  Filled 2019-02-26: qty 4

## 2019-02-26 MED ORDER — INSULIN ASPART 100 UNIT/ML ~~LOC~~ SOLN
0.0000 [IU] | Freq: Every day | SUBCUTANEOUS | Status: DC
  Administered 2019-02-28: 23:00:00 2 [IU] via SUBCUTANEOUS

## 2019-02-26 NOTE — ED Notes (Signed)
Checked pt O2 tank. O2 tank replaced and pt given 2 warm blankets

## 2019-02-26 NOTE — ED Provider Notes (Signed)
[facility] Provider Note  CSN: [id number] Arrival date & time: 02/21/2019 2136  Chief Complaint(s) Fall and Generalized Edema  HPI Mellody MemosFrederick L Frisk is a 58 y.o. male with a past medical history listed below including chronic systolic/diastolic heart failure on Lasix who is noncompliant, schizophrenia, polysubstance abuse including cocaine who presents to the emergency department for peripheral edema including scrotal swelling which is chronic for the patient.  He also reports having a mechanical fall.  He denied any head trauma or loss of consciousness.  He is ambulating without complications.  Cannot give me specific location of pain.  Endorses cocaine use earlier tonight.  Admits to noncompliance with Lasix.  Denies any chest pain or shortness of breath.  HPI  Past Medical History Past Medical History:  Diagnosis Date  . Chronic foot pain   . Cocaine abuse (HCC)   . Diabetes mellitus without complication (HCC)   . Hepatitis C    unsure   . Homelessness   . Hypertension   . Neuropathy   . Polysubstance abuse (HCC)   . Schizophrenia (HCC)   . Sleep apnea   . Systolic and diastolic CHF, chronic [facility](HCC)    Patient Active Problem List   Diagnosis Date Noted  . CHF (congestive heart failure), NYHA class III, acute on chronic, combined (HCC) 02/02/2019  . Scrotal edema 01/30/2019  . Combined systolic and diastolic heart failure (HCC) 01/19/2019  . Chest pain 01/16/2019  . 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  . Medically noncompliant   . Acute on chronic systolic CHF (congestive heart failure) (HCC) 01/02/2019  . 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 Acute on chronic systolic heart failure (HCC) 12/26/2018  . Mobitz type II atrioventricular block 12/25/2018  . Acute exacerbation of CHF (congestive heart failure) (HCC) 12/22/2018  . Acute on chronic respiratory failure with hypoxia (HCC)  12/22/2018  . CHF exacerbation (HCC) 12/19/2018  . Acute CHF (congestive heart failure) (HCC) 12/16/2018  . Diabetes mellitus without complication (HCC) 12/11/2018  . Acute on chronic systolic (congestive) heart failure (HCC) 12/11/2018  . Abdominal pain 12/11/2018  . Nausea vomiting and diarrhea 12/11/2018  . Microcytic anemia 12/11/2018  . Evaluation by psychiatric service required   . MDD (major depressive disorder), severe (HCC) 11/25/2018  . Pressure injury of skin 11/08/2018  . Elevated troponin 10/18/2018  . HLD (hyperlipidemia) 10/18/2018  . Anxiety 10/18/2018  . CHF (congestive heart failure), NYHA class II, acute on chronic, combined (HCC) 10/18/2018  . Hypertensive urgency 10/12/2018  . Mild renal insufficiency 10/12/2018  . Cellulitis 10/12/2018  . Penile cellulitis   . Adjustment disorder with mixed disturbance of emotions and conduct   . Sleep apnea 10/03/2018  . Hypokalemia 09/17/2018  . Scrotal swelling 09/17/2018  . Urinary hesitancy   . Constipation   . Acute on chronic combined systolic and diastolic CHF (congestive heart failure) (HCC) 08/25/2018  . Acute respiratory failure with hypoxia (HCC) 08/25/2018  . Tobacco use 08/18/2018  . Homelessness 08/08/2018  . Smoker 08/08/2018  . Prostate enlargement 03/16/2018  . Aortic atherosclerosis (HCC) 03/16/2018  . 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 Cocaine abuse with cocaine-induced mood disorder (HCC) 09/18/2017  . Chronic foot pain   . Schizoaffective disorder, bipolar type (HCC) 09/30/2016  . Substance induced mood disorder (HCC) 03/13/2015  . Schizophrenia, paranoid type (HCC) 01/17/2015  . Suicidal ideation   . Drug hallucinosis (HCC) 10/08/2014  . Chronic paranoid schizophrenia (HCC)  09/07/2014  . 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 Urinary retention   . Cocaine use disorder, severe, dependence (HCC)   . Essential  hypertension 03/28/2013  . Type 2 diabetes mellitus (HCC) 03/15/2013   Home Medication(s) Prior to Admission medications   Medication Sig Start Date End Date Taking? Authorizing Provider  acetaminophen (TYLENOL) 325 MG tablet Take 2 tablets (650 mg total) by mouth every 8 (eight) hours as needed. 02/07/2019   Raeford RazorKohut, Stephen, MD  furosemide (LASIX) 40 MG tablet Take 1 tablet (40 mg total) by mouth 2 (two) times daily. 02/13/2019 03/27/19  Raeford RazorKohut, Stephen, MD  glipiZIDE (GLUCOTROL) 5 MG tablet Take 0.5 tablets (2.5 mg total) by mouth 2 (two) times daily before a meal. 03/01/2019   Raeford RazorKohut, Stephen, MD  Lidocaine, Anorectal, 5 % CREA Apply to sore on penis as needed for pain. 02/20/19   Molpus, John, MD                                                                                                                                    Past Surgical History Past Surgical History:  Procedure Laterality Date  . MULTIPLE TOOTH EXTRACTIONS     Family History Family History  Problem Relation Age of Onset  . Hypertension Other   . Diabetes Other     Social History Social History   Tobacco Use  . Smoking status: Current Every Day Smoker    Packs/day: 1.00    Years: 20.00    Pack years: 20.00    Types: Cigarettes  . Smokeless tobacco: Current User  Substance Use Topics  . Alcohol use: Yes    Comment: Daily Drinker   . Drug use: Yes    Frequency: 7.0 times per week    Types: "Crack" cocaine, Cocaine, Marijuana    Comment: Crack   Allergies Haldol [haloperidol]  Review of Systems Review of Systems All other systems are reviewed and are negative for acute change except as noted in the HPI  Physical Exam Vital Signs  I have reviewed the triage vital signs BP (!) 137/93   Pulse (!) 102   Temp 99 F (37.2 C) (Oral)   Resp 13   Ht 6' (1.829 m)   Wt 120 kg   SpO2 96% RA   BMI 35.88 kg/m   Physical Exam Vitals signs reviewed.  Constitutional:      General: He is not in acute distress.     Appearance: He is well-developed. He is not diaphoretic.  HENT:     Head: Normocephalic and atraumatic.     Right Ear: External ear normal.     Left Ear: External ear normal.     Nose: Nose normal.  Eyes:     General: No scleral icterus.       Right eye: No discharge.        Left eye: No  discharge.     Conjunctiva/sclera: Conjunctivae normal.     Pupils: Pupils are equal, round, and reactive to light.  Neck:     Musculoskeletal: Normal range of motion and neck supple.  Cardiovascular:     Rate and Rhythm: Normal rate and regular rhythm.     Pulses:          Radial pulses are 2+ on the right side and 2+ on the left side.       Dorsalis pedis pulses are 2+ on the right side and 2+ on the left side.     Heart sounds: Normal heart sounds. No murmur. No friction rub. No gallop.      Comments: Edema up to lower abdomen Pulmonary:     Effort: Pulmonary effort is normal. No tachypnea or respiratory distress.     Breath sounds: Normal breath sounds. No stridor. No rales.     Comments: Speaks in full sentences Abdominal:     General: There is no distension.     Palpations: Abdomen is soft.     Tenderness: There is no abdominal tenderness.  Musculoskeletal:        General: No tenderness.     Cervical back: He exhibits no bony tenderness.     Thoracic back: He exhibits no bony tenderness.     Lumbar back: He exhibits no bony tenderness.     Right lower leg: 2+ Pitting Edema present.     Left lower leg: 2+ Pitting Edema present.     Comments: Clavicle stable. Chest stable to AP/Lat compression. Pelvis stable to Lat compression. No obvious extremity deformity. No chest or abdominal wall contusion.  Skin:    General: Skin is warm and dry.     Findings: No erythema or rash.  Neurological:     Mental Status: He is alert and oriented to person, place, and time.     GCS: GCS eye subscore is 4. GCS verbal subscore is 5. GCS motor subscore is 6.     Comments: Moving all extremities       ED Results and Treatments Labs (all labs ordered are listed, but only abnormal results are displayed) Labs Reviewed  BASIC METABOLIC PANEL - Abnormal; Notable for the following components:      Result Value   Glucose, Bld 181 (*)    Calcium 8.7 (*)    All other components within normal limits  CBC WITH DIFFERENTIAL/PLATELET - Abnormal; Notable for the following components:   RBC 4.19 (*)    Hemoglobin 8.8 (*)    HCT 31.7 (*)    MCV 75.7 (*)    MCH 21.0 (*)    MCHC 27.8 (*)    RDW 21.5 (*)    nRBC 0.4 (*)    All other components within normal limits  BRAIN NATRIURETIC PEPTIDE  EKG  EKG Interpretation  Date/Time:  Friday February 26 2019 01:44:14 EDT Ventricular Rate:  108 PR Interval:    QRS Duration: 75 QT Interval:  355 QTC Calculation: 472 R Axis:   47 Text Interpretation:  Sinus tachycardia Ventricular premature complex Low voltage, extremity leads Nonspecific T abnormalities, lateral leads Baseline wander in lead(s) V2 V3 When compared with ECG of 02/21/2019, Premature ventricular complexes are now present Confirmed by Delora Fuel ([id number]) on 02/26/2019 2:14:57 AM      Radiology Dg Chest Port 1 View  Result Date: 02/26/2019 CLINICAL DATA:  Shortness of breath EXAM: PORTABLE CHEST 1 VIEW COMPARISON:  Five days ago FINDINGS: Cardiomegaly with vascular congestion. Very low lung volumes. Streaky left perihilar density. No Kerley lines, effusion, or pneumothorax. IMPRESSION: 1. Cardiomegaly and vascular congestion. 2. Low volume chest with band of left perihilar opacity favoring atelectasis. Electronically Signed   By: Monte Fantasia M.D.   On: 02/26/2019 06:13    Pertinent labs & imaging results that were available during my care of the patient were reviewed by me and considered in my medical decision making (see chart for details).  Medications Ordered in ED  Medications  furosemide (LASIX) tablet 40 mg (40 mg Oral Given 02/26/19 0226)  acetaminophen (TYLENOL) tablet 325 mg (325 mg Oral Given 02/26/19 0226)  furosemide (LASIX) injection 40 mg (40 mg Intravenous Given 02/26/19 0610)                                                                                                                                    Procedures Procedures  (including critical care time)  Medical Decision Making / ED Course I have reviewed the nursing notes for this encounter and the patient's prior records (if available in EHR or on provided paperwork).   VANN OKERLUND was evaluated in Emergency Department on 02/26/2019 for the symptoms described in the history of present illness. He was evaluated in the context of the global COVID-19 pandemic, which necessitated consideration that the patient might be at risk for infection with the SARS-CoV-2 virus that causes COVID-19. Institutional protocols and algorithms that pertain to the evaluation of patients at risk for COVID-19 are in a state of rapid change based on information released by regulatory bodies including the CDC and federal and state organizations. These policies and algorithms were followed during the patient's care in the ED.  Patient reports a mechanical fall.  No obvious signs of trauma on exam.  No tenderness to palpation.  No need for imaging at this time.  Patient is in no respiratory distress, speaking in in full sentences and satting well on room air.  Lungs clear to auscultation bilaterally.  Evidence of chronic anasarca.   Patient provided with oral dose of Lasix and Tylenol for scrotal pain (which is chronic and related to edema).  No evidence of superimposed infection.  Ambulated patient, but he desated to 86% on RA  with increased WOB noted. Work up expanded.  IV lasix given. CXR with pulm vasc congestion - no edema Labs with stable Hb and renal function. BNP pending.   Will need to f/u BNP  and reassess with amb pulse ox.  Patient care turned over to Dr Rubin Payor at 0700. Patient case and results discussed in detail; please see their note for further ED managment.           Final Clinical Impression(s) / ED Diagnoses Final diagnoses:  SOB (shortness of breath)     This chart was dictated using voice recognition software.  Despite best efforts to proofread,  errors can occur which can change the documentation meaning.   Nira Conn, MD 02/26/19 [phone]

## 2019-02-26 NOTE — ED Notes (Signed)
Anna(SR-Lunch Tray Ordered) @ 1004.  

## 2019-02-26 NOTE — ED Notes (Signed)
Pt ambulated on room on room air, O2 sats 88-95%; when pt sat back down on stretcher, sats continued to drop into upper 70's; pt placed back on 2L [place]; Dr. Alvino Chapel notified

## 2019-02-26 NOTE — ED Notes (Signed)
HH/Carb mod breakfast tray ordered

## 2019-02-26 NOTE — ED Notes (Signed)
Pt give food and beverage per Dr. Alvino Chapel

## 2019-02-26 NOTE — Progress Notes (Signed)
Patient refuses a condom catheter and says he will uses a urinal. Patient used urinal, but also wet the bed, so urine output measurements are not as accurate.

## 2019-02-26 NOTE — ED Notes (Signed)
Pt ambulated around room maintaining sats between 85%-88% on RA. Pt was labored and visibly SOB during ambulation.

## 2019-02-26 NOTE — ED Provider Notes (Signed)
  Physical Exam  BP (!) 140/96   Pulse (!) 101   Temp 99 F (37.2 C) (Oral)   Resp (!) 21   Ht 6' (1.829 m)   Wt 120 kg   SpO2 95%   BMI 35.88 kg/m   Physical Exam  ED Course/Procedures     Procedures  MDM  Received patient in signout.  History of CHF and noncompliance.  BNP is elevated.  Had recent admission to hospital for same.  IV Lasix and oral Lasix given here in an attempt to not admit him, however with ambulation off oxygen even after IV diuresis patient sats will go down into the 70s.  Will admit to hospitalist       Davonna Belling, MD 02/26/19 [phone]
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
## 2019-02-26 NOTE — ED Notes (Signed)
Patient received breakfast tray  Earlier and lunch tray was just delivered to room.

## 2019-02-27 ENCOUNTER — Other Ambulatory Visit: Payer: Self-pay

## 2019-02-27 DIAGNOSIS — E119 Type 2 diabetes mellitus without complications: Secondary | ICD-10-CM

## 2019-02-27 DIAGNOSIS — I10 Essential (primary) hypertension: Secondary | ICD-10-CM

## 2019-02-27 DIAGNOSIS — Z720 Tobacco use: Secondary | ICD-10-CM

## 2019-02-27 DIAGNOSIS — E7800 Pure hypercholesterolemia, unspecified: Secondary | ICD-10-CM

## 2019-02-27 DIAGNOSIS — F1414 Cocaine abuse with cocaine-induced mood disorder: Secondary | ICD-10-CM

## 2019-02-27 LAB — CBC WITH DIFFERENTIAL/PLATELET
Abs Immature Granulocytes: 0.02 10*3/uL (ref 0.00–0.07)
Basophils Absolute: 0.1 10*3/uL (ref 0.0–0.1)
Basophils Relative: 1 %
Eosinophils Absolute: 0.2 10*3/uL (ref 0.0–0.5)
Eosinophils Relative: 3 %
HCT: 29.5 % — ABNORMAL LOW (ref 39.0–52.0)
Hemoglobin: 8.2 g/dL — ABNORMAL LOW (ref 13.0–17.0)
Immature Granulocytes: 0 %
Lymphocytes Relative: 16 %
Lymphs Abs: 1 10*3/uL (ref 0.7–4.0)
MCH: 21 pg — ABNORMAL LOW (ref 26.0–34.0)
MCHC: 27.8 g/dL — ABNORMAL LOW (ref 30.0–36.0)
MCV: 75.4 fL — ABNORMAL LOW (ref 80.0–100.0)
Monocytes Absolute: 0.8 10*3/uL (ref 0.1–1.0)
Monocytes Relative: 12 %
Neutro Abs: 4.5 10*3/uL (ref 1.7–7.7)
Neutrophils Relative %: 68 %
Platelets: 245 10*3/uL (ref 150–400)
RBC: 3.91 MIL/uL — ABNORMAL LOW (ref 4.22–5.81)
RDW: 21.3 % — ABNORMAL HIGH (ref 11.5–15.5)
WBC: 6.6 10*3/uL (ref 4.0–10.5)
nRBC: 0.6 % — ABNORMAL HIGH (ref 0.0–0.2)

## 2019-02-27 LAB — BASIC METABOLIC PANEL
Anion gap: 9 (ref 5–15)
BUN: 15 mg/dL (ref 6–20)
CO2: 34 mmol/L — ABNORMAL HIGH (ref 22–32)
Calcium: 8.5 mg/dL — ABNORMAL LOW (ref 8.9–10.3)
Chloride: 98 mmol/L (ref 98–111)
Creatinine, Ser: 1.13 mg/dL (ref 0.61–1.24)
GFR calc Af Amer: >60 mL/min (ref >60)
GFR calc non Af Amer: >60 mL/min (ref >60)
Glucose, Bld: 187 mg/dL — ABNORMAL HIGH (ref 70–99)
Potassium: 3.4 mmol/L — ABNORMAL LOW (ref 3.5–5.1)
Sodium: 141 mmol/L (ref 135–145)

## 2019-02-27 LAB — GLUCOSE, CAPILLARY
Glucose-Capillary: 164 mg/dL — ABNORMAL HIGH (ref 70–99)
Glucose-Capillary: 398 mg/dL — ABNORMAL HIGH (ref 70–99)
Glucose-Capillary: 253 mg/dL — ABNORMAL HIGH (ref 70–99)
Glucose-Capillary: 253 mg/dL — ABNORMAL HIGH (ref 70–99)
Glucose-Capillary: 131 mg/dL — ABNORMAL HIGH (ref 70–99)

## 2019-02-27 MED ORDER — SIMETHICONE 80 MG PO CHEW
80.0000 mg | CHEWABLE_TABLET | Freq: Four times a day (QID) | ORAL | Status: DC | PRN
  Administered 2019-02-28 – 2019-03-01 (×3): 80 mg via ORAL
  Filled 2019-02-27 (×4): qty 1

## 2019-02-27 MED ORDER — FUROSEMIDE 10 MG/ML IJ SOLN
40.0000 mg | Freq: Two times a day (BID) | INTRAMUSCULAR | Status: DC
  Administered 2019-02-27 – 2019-03-01 (×4): 40 mg via INTRAVENOUS
  Filled 2019-02-27 (×4): qty 4

## 2019-02-27 MED ORDER — POTASSIUM CHLORIDE CRYS ER 20 MEQ PO TBCR
40.0000 meq | EXTENDED_RELEASE_TABLET | Freq: Once | ORAL | Status: AC
  Administered 2019-02-27: 12:00:00 40 meq via ORAL
  Filled 2019-02-27: qty 2

## 2019-02-27 MED ORDER — INSULIN GLARGINE 100 UNIT/ML ~~LOC~~ SOLN
30.0000 [IU] | Freq: Every day | SUBCUTANEOUS | Status: DC
  Administered 2019-02-28 – 2019-03-01 (×2): 30 [IU] via SUBCUTANEOUS
  Filled 2019-02-27 (×3): qty 0.3

## 2019-02-27 NOTE — Evaluation (Signed)
Physical Therapy Evaluation Patient Details Name: [name] MRN: [id number] DOB: 31-Mar-1961 Today's Date: 02/27/2019   History of Present Illness  58 yo male admitted to ED on 8/25, 8/26, 8/28 with falls, LE and scrotal swelling, ShOB. Pt with diagnosis of CHF exacerbation. PMH includes cocaine abuse, hep C, DMII, homelessness, CHF, HTN, schizophrenia, MDD, HLD.  Clinical Impression   Pt presents with generalized weakness, history of repeated falls, restless/agitation with mobility, difficulty performing all mobility tasks, decreased safety awareness along with difficulty following commands, dyspnea on exertion with O2 desaturation on RA, unsteadiness in standing, and decreased activity tolerance due to fatigue and dyspnea on exertion. Pt to benefit from acute PT to address deficits. Pt ambulated short hallway distance using RW, requiring min assist for steadying and guiding pt/RW. PT recommending SNF placement, vs homeless shelter as pt does not have a d/c destination. PT to progress mobility as tolerated, and will continue to follow acutely.    HR: 100-111 bpm  SpO2: RA - 83% post-ambulation; 3LO2 - 97%    Follow Up Recommendations SNF(vs shelter)    Equipment Recommendations  Other (comment)(defer to next venue)    Recommendations for Other Services       Precautions / Restrictions Precautions Precautions: Fall Restrictions Weight Bearing Restrictions: No      Mobility  Bed Mobility Overal bed mobility: Needs Assistance Bed Mobility: Supine to Sit     Supine to sit: Min assist;HOB elevated     General bed mobility comments: Min assist for coming to upright for trunk elevation, leg lowering when moving to sitting. Verbal cuing for sequencing, pt with use of bedrails to come to sitting, increased time and effort to perform.  Transfers Overall transfer level: Needs assistance Equipment used: Rolling walker (2 wheeled) Transfers: Sit to/from Stand Sit to Stand:  Min assist;From elevated surface         General transfer comment: Min assist for power up, steadying. Verbal cuing for hand placement when rising. Pt not following safety commands.  Ambulation/Gait Ambulation/Gait assistance: Min assist Gait Distance (Feet): 50 Feet Assistive device: Rolling walker (2 wheeled) Gait Pattern/deviations: Step-through pattern;Trunk flexed;Drifts right/left;Decreased stride length;Wide base of support Gait velocity: decr   General Gait Details: Min assist for steadying, guiding pt and RW, steadying RW. Pt with forward flexed posture, so severe that RW was difficult for pt to control and keep close to him. Wide BOS given scrotal edema and anal pain, unable to ambulate inside RW. Pt with dyspnea on exertion 3/4, pt ambulated on RA given pt baseline, desatted to 83%. Pt placed on 3-4LO2 to recover sats >92%, and was instructed in breathing technique (in through nose, out through mouth) to recover sats. Pt recovered well, resting comfortably.  Stairs            Wheelchair Mobility    Modified Rankin (Stroke Patients Only)       Balance Overall balance assessment: Needs assistance;History of Falls Sitting-balance support: No upper extremity supported;Feet supported Sitting balance-Leahy Scale: Fair Sitting balance - Comments: able to sit EOB unsupported   Standing balance support: Bilateral upper extremity supported Standing balance-Leahy Scale: Poor Standing balance comment: reliant on UE support, high fall risk, generally unsteady and unsafe in standing                             Pertinent Vitals/Pain Pain Assessment: 0-10 Pain Score: 3  Pain Location: "my anus hurts" Pain Descriptors / Indicators:  Sore Pain Intervention(s): Limited activity within patient's tolerance;Monitored during session;Repositioned    Home Living Family/patient expects to be discharged to:: Shelter/Homeless                 Additional Comments:  homeless    Prior Function Level of Independence: Independent         Comments: Pt reports he does not use AD to ambulate or get around, as he is homeless. Pt states he walks until he falls.     Hand Dominance   Dominant Hand: Right    Extremity/Trunk Assessment   Upper Extremity Assessment Upper Extremity Assessment: Generalized weakness    Lower Extremity Assessment Lower Extremity Assessment: Generalized weakness;LLE deficits/detail;RLE deficits/detail RLE Deficits / Details: edema in bilateral LEs with tightness; pt with 3/5 hip and knee flexion/extension LLE Deficits / Details: see above    Cervical / Trunk Assessment Cervical / Trunk Assessment: Normal  Communication   Communication: Expressive difficulties(Pt difficult to understand at times)  Cognition Arousal/Alertness: Awake/alert Behavior During Therapy: Restless;Anxious;Impulsive Overall Cognitive Status: Impaired/Different from baseline Area of Impairment: Attention;Memory;Following commands;Safety/judgement;Problem solving                   Current Attention Level: Sustained   Following Commands: Follows one step commands inconsistently Safety/Judgement: Decreased awareness of safety;Decreased awareness of deficits   Problem Solving: Difficulty sequencing;Requires verbal cues;Requires tactile cues General Comments: Pt very restless, borderline agitated. Pt moving from being polite and quietly answering questions to yelling at PT and RN.      General Comments General comments (skin integrity, edema, etc.): pt with signifcant LE edema to the point of tightness of skin    Exercises     Assessment/Plan    PT Assessment Patient needs continued PT services  PT Problem List Decreased strength;Decreased mobility;Decreased range of motion;Decreased activity tolerance;Decreased balance;Decreased cognition;Decreased safety awareness;Decreased knowledge of use of DME;Pain       PT Treatment  Interventions DME instruction;Therapeutic exercise;Gait training;Balance training;Functional mobility training;Therapeutic activities;Patient/family education    PT Goals (Current goals can be found in the Care Plan section)  Acute Rehab PT Goals Patient Stated Goal: decrease my pain PT Goal Formulation: With patient Time For Goal Achievement: 03/13/19 Potential to Achieve Goals: Good    Frequency Min 2X/week   Barriers to discharge Other (comment)(homelessness)      Co-evaluation               AM-PAC PT "6 Clicks" Mobility  Outcome Measure Help needed turning from your back to your side while in a flat bed without using bedrails?: A Little Help needed moving from lying on your back to sitting on the side of a flat bed without using bedrails?: A Lot Help needed moving to and from a bed to a chair (including a wheelchair)?: A Little Help needed standing up from a chair using your arms (e.g., wheelchair or bedside chair)?: A Little Help needed to walk in hospital room?: A Little Help needed climbing 3-5 steps with a railing? : Total 6 Click Score: 15    End of Session Equipment Utilized During Treatment: Gait belt;Oxygen(3-4 LO2 reapplied at end of ambulation with recovery of sats to 97%) Activity Tolerance: Patient limited by fatigue Patient left: in chair;with chair alarm set;with call bell/phone within reach;with nursing/sitter in room Nurse Communication: Mobility status PT Visit Diagnosis: Unsteadiness on feet (R26.81);Repeated falls (R29.6);Muscle weakness (generalized) (M62.81)    Time: [id number] PT Time Calculation (min) (ACUTE ONLY): 32 min   Charges:  PT Evaluation $PT Eval Low Complexity: 1 Low PT Treatments $Gait Training: 8-22 mins        Nicola Police, PT Acute Rehabilitation Services Pager [phone]  Office [phone]   Tyrone Apple D Despina Hidden 02/27/2019, 10:54 AM

## 2019-02-27 NOTE — Progress Notes (Signed)
PROGRESS NOTE    Taylor MemosFrederick L [name]  [id number] DOB: 04/21/1961 DOA: 02/20/2019 PCP: Patient, No Pcp Per    Brief Narrative:  58 year old male who presented after a fall.  Significant History of Systolic Heart Failure, Hypertension, Type 2 Diabetes Mellitus, Hep C, Polysubstance Abuse, and schizophrenia.  Apparently patient sustained a mechanical fall while using cocaine.  He has been noncompliant with his medications, has frequent hospitalizations, but he tends to leave the hospital AGAINST MEDICAL ADVICE.  On his initial physical examination he was somnolent, unable to give a detailed history, his blood pressure was 141/100, heart rate 101, respirate 17, oxygen saturation 98%.  Heart S1-S2 present, no significant wheezing, rhonchi, crackles, his abdomen was distended, positive significant lower extremity edema. Sodium 141, potassium 3.9, chloride 100, bicarb 30, glucose 181, creatinine 0.95, BNP 1,524, white count 7.8, hemoglobin 8.8, hematocrit 31.7, platelets 280.  SARS COVID-19 was negative.  Drug screen positive for cocaine.  His chest radiograph had bilateral interstitial infiltrates, bilateral small pleural effusions and cardiomegaly.  EKG 180 bpm, normal axis, normal intervals, sinus rhythm, no ST segment or T wave changes, positive PVCs, low voltage  Patient was admitted to the hospital working diagnosis acute cocaine intoxication complicated by acute on chronic systolic heart failure exacerbation, cardiogenic pulmonary edema.  Assessment & Plan:   Active Problems:   Essential hypertension   Cocaine abuse with cocaine-induced mood disorder (HCC)   Tobacco use   HLD (hyperlipidemia)   Diabetes mellitus without complication (HCC)   Acute on chronic combined systolic (congestive) and diastolic (congestive) heart failure (HCC)   1, Acute on chronic systolic heart failure exacerbation, complicated with acute cardiogenic pulmonary edema. Urine output since admission 2,250 ml.  Continue hypervolemic with lower extremity edema and scrotal edema. Will continue diuresis with IV furosemide 40 mg IV q12 H to target negative fluid balance. Continue ace inh with low dose lisinopril. Hold on b blocker due to cocaine intoxication.  2. HTN. Blood pressure 143/92, will continue aggressive diuresis and lisinopril.  3. T2DM. Will continue glucose cover and monitoring with insulin sliding scale, fasting glucose 187 this am. Patient is tolerating po well. Continue basal insulin 30 units daily.   4. Cocaine intoxication/ tobacco abuse. Will continue supportive medical care, continue telemetry monitoring, holding on b blocker for now. Continue nicotine patch.   5. Hypokalemia/. Continue K correction with Kcl target K of 4.    DVT prophylaxis: enoxaparin   Code Status: full Family Communication: no family at the bedside Disposition Plan/ discharge barriers: pending clinical improvement  Body mass index is 35.13 kg/m. Malnutrition Type:      Malnutrition Characteristics:      Nutrition Interventions:     RN Pressure Injury Documentation:     Consultants:     Procedures:     Antimicrobials:       Subjective: Patient continue to have dyspnea and lower extremity edema, along with scrotal edema. No chest pain, no nausea or vomiting.   Objective: Vitals:   02/27/19 0545 02/27/19 0904 02/27/19 0916 02/27/19 0928  BP: (!) 137/92 (!) 143/93    Pulse: 95 (!) 110 100   Resp: 20 (!) 21    Temp: 98 F (36.7 C)     TempSrc: Oral     SpO2: 90% 97%  95%  Weight:      Height:        Intake/Output Summary (Last 24 hours) at 02/27/2019 1056 Last data filed at 02/27/2019 0900 Gross per 24 hour  Intake 2030 ml  Output 1500 ml  Net 530 ml   Filed Weights   02-26-2019 2141 02/27/19 0446  Weight: 120 kg 117.5 kg    Examination:   General: Not in pain, but deconditioned  Neurology: Awake and alert, non focal  E ENT: no pallor, no icterus, oral mucosa  moist Cardiovascular: No JVD. S1-S2 present + S3 gallop, rhythmic, rubs, or murmurs. ++ bilateral lower extremity edema. Pulmonary: positive breath sounds bilaterally, decreased air movement, no wheezing,scattered bilateral rhonchi or rales. Gastrointestinal. Abdomen protuberant no organomegaly, non tender, no rebound or guarding/ positive scrotal edema.  Skin. No rashes Musculoskeletal: no joint deformities     Data Reviewed: I have personally reviewed following labs and imaging studies  CBC: Recent Labs  Lab 02/21/19 1438 02/22/19 0510 02/26/19 0527 02/26/19 2341  WBC 8.3 6.1 7.8 6.6  NEUTROABS 6.6  --  5.9 4.5  HGB 8.8* 8.5* 8.8* 8.2*  HCT 32.5* 31.2* 31.7* 29.5*  MCV 76.7* 76.1* 75.7* 75.4*  PLT 385 339 280 814   Basic Metabolic Panel: Recent Labs  Lab 02/21/19 1438 02/22/19 0510 02/26/19 0527 02/27/19 0435  NA 137 139 141 141  K 3.6 3.9 3.9 3.4*  CL 96* 99 100 98  CO2 28 32 30 34*  GLUCOSE 337* 237* 181* 187*  BUN 11 9 11 15   CREATININE 0.88 0.80 0.95 1.13  CALCIUM 8.9 9.0 8.7* 8.5*  MG 1.8  --   --   --    GFR: Estimated Creatinine Clearance: 94.3 mL/min (by C-G formula based on SCr of 1.13 mg/dL). Liver Function Tests: No results for input(s): AST, ALT, ALKPHOS, BILITOT, PROT, ALBUMIN in the last 168 hours. No results for input(s): LIPASE, AMYLASE in the last 168 hours. No results for input(s): AMMONIA in the last 168 hours. Coagulation Profile: No results for input(s): INR, PROTIME in the last 168 hours. Cardiac Enzymes: No results for input(s): CKTOTAL, CKMB, CKMBINDEX, TROPONINI in the last 168 hours. BNP (last 3 results) No results for input(s): PROBNP in the last 8760 hours. HbA1C: No results for input(s): HGBA1C in the last 72 hours. CBG: Recent Labs  Lab 02/22/19 1611 02/26/19 1653 02/26/19 2102 02/27/19 0602 02/27/19 0753  GLUCAP 239* 223* 161* 164* 398*   Lipid Profile: No results for input(s): CHOL, HDL, LDLCALC, TRIG, CHOLHDL,  LDLDIRECT in the last 72 hours. Thyroid Function Tests: No results for input(s): TSH, T4TOTAL, FREET4, T3FREE, THYROIDAB in the last 72 hours. Anemia Panel: No results for input(s): VITAMINB12, FOLATE, FERRITIN, TIBC, IRON, RETICCTPCT in the last 72 hours.    Radiology Studies: I have reviewed all of the imaging during this hospital visit personally     Scheduled Meds: . ARIPiprazole  10 mg Oral Daily  . aspirin EC  81 mg Oral Daily  . carbamazepine  100 mg Oral BID  . enoxaparin (LOVENOX) injection  40 mg Subcutaneous Q24H  . furosemide  40 mg Intravenous Q8H  . insulin aspart  0-15 Units Subcutaneous TID WC  . insulin aspart  0-5 Units Subcutaneous QHS  . insulin aspart  3 Units Subcutaneous TID WC  . insulin glargine  30 Units Subcutaneous BID  . lisinopril  5 mg Oral Daily  . nicotine  14 mg Transdermal Daily  . sodium chloride flush  3 mL Intravenous Q12H   Continuous Infusions: . sodium chloride       LOS: 1 day         Gerome Apley, MD

## 2019-02-27 NOTE — Plan of Care (Signed)
  Problem: Education: Goal: Knowledge of General Education information will improve Description: Including pain rating scale, medication(s)/side effects and non-pharmacologic comfort measures Outcome: Progressing   Problem: Health Behavior/Discharge Planning: Goal: Ability to manage health-related needs will improve Outcome: Progressing   Problem: Clinical Measurements: Goal: Respiratory complications will improve Outcome: Progressing   Problem: Activity: Goal: Risk for activity intolerance will decrease Outcome: Progressing   Problem: Nutrition: Goal: Adequate nutrition will be maintained Outcome: Progressing   

## 2019-02-27 NOTE — Progress Notes (Signed)
VS taken immediately after straining to have BM.  Will recheck again to verify.

## 2019-02-27 NOTE — Progress Notes (Signed)
CSW met with patient bedside to address the homeless consult. CSW is  familiar with patient due to his number of admissions and have attempted to secure placements for him in the past. There are numerous barriers with patient which makes placement very difficult.  CSW has reached out to the [facility] for a possible hotel voucher however unsure will hear back due to it being the weekend.  CSW inquired about patient behaviors when he is in the hospital. Patient assured CSW that he will behave himself and will not leave AMA this time.  CSW encouraged patient to comply with what he stated that he would do while in the hospital.

## 2019-02-28 LAB — BASIC METABOLIC PANEL
Anion gap: 10 (ref 5–15)
BUN: 18 mg/dL (ref 6–20)
CO2: 33 mmol/L — ABNORMAL HIGH (ref 22–32)
Calcium: 8.7 mg/dL — ABNORMAL LOW (ref 8.9–10.3)
Chloride: 98 mmol/L (ref 98–111)
Creatinine, Ser: 0.94 mg/dL (ref 0.61–1.24)
GFR calc Af Amer: >60 mL/min (ref >60)
GFR calc non Af Amer: >60 mL/min (ref >60)
Glucose, Bld: 195 mg/dL — ABNORMAL HIGH (ref 70–99)
Potassium: 3.9 mmol/L (ref 3.5–5.1)
Sodium: 141 mmol/L (ref 135–145)

## 2019-02-28 LAB — GLUCOSE, CAPILLARY
Glucose-Capillary: 170 mg/dL — ABNORMAL HIGH (ref 70–99)
Glucose-Capillary: 159 mg/dL — ABNORMAL HIGH (ref 70–99)
Glucose-Capillary: 251 mg/dL — ABNORMAL HIGH (ref 70–99)
Glucose-Capillary: 149 mg/dL — ABNORMAL HIGH (ref 70–99)
Glucose-Capillary: 237 mg/dL — ABNORMAL HIGH (ref 70–99)

## 2019-02-28 IMAGING — DX DG CHEST 2V
2 series · 2 of 2 positions shown · non-contrast
Comparison: 08/07/2018, 08/03/2018

CLINICAL DATA: Shortness of breath

EXAM:
CHEST - 2 VIEW

[chest pa]
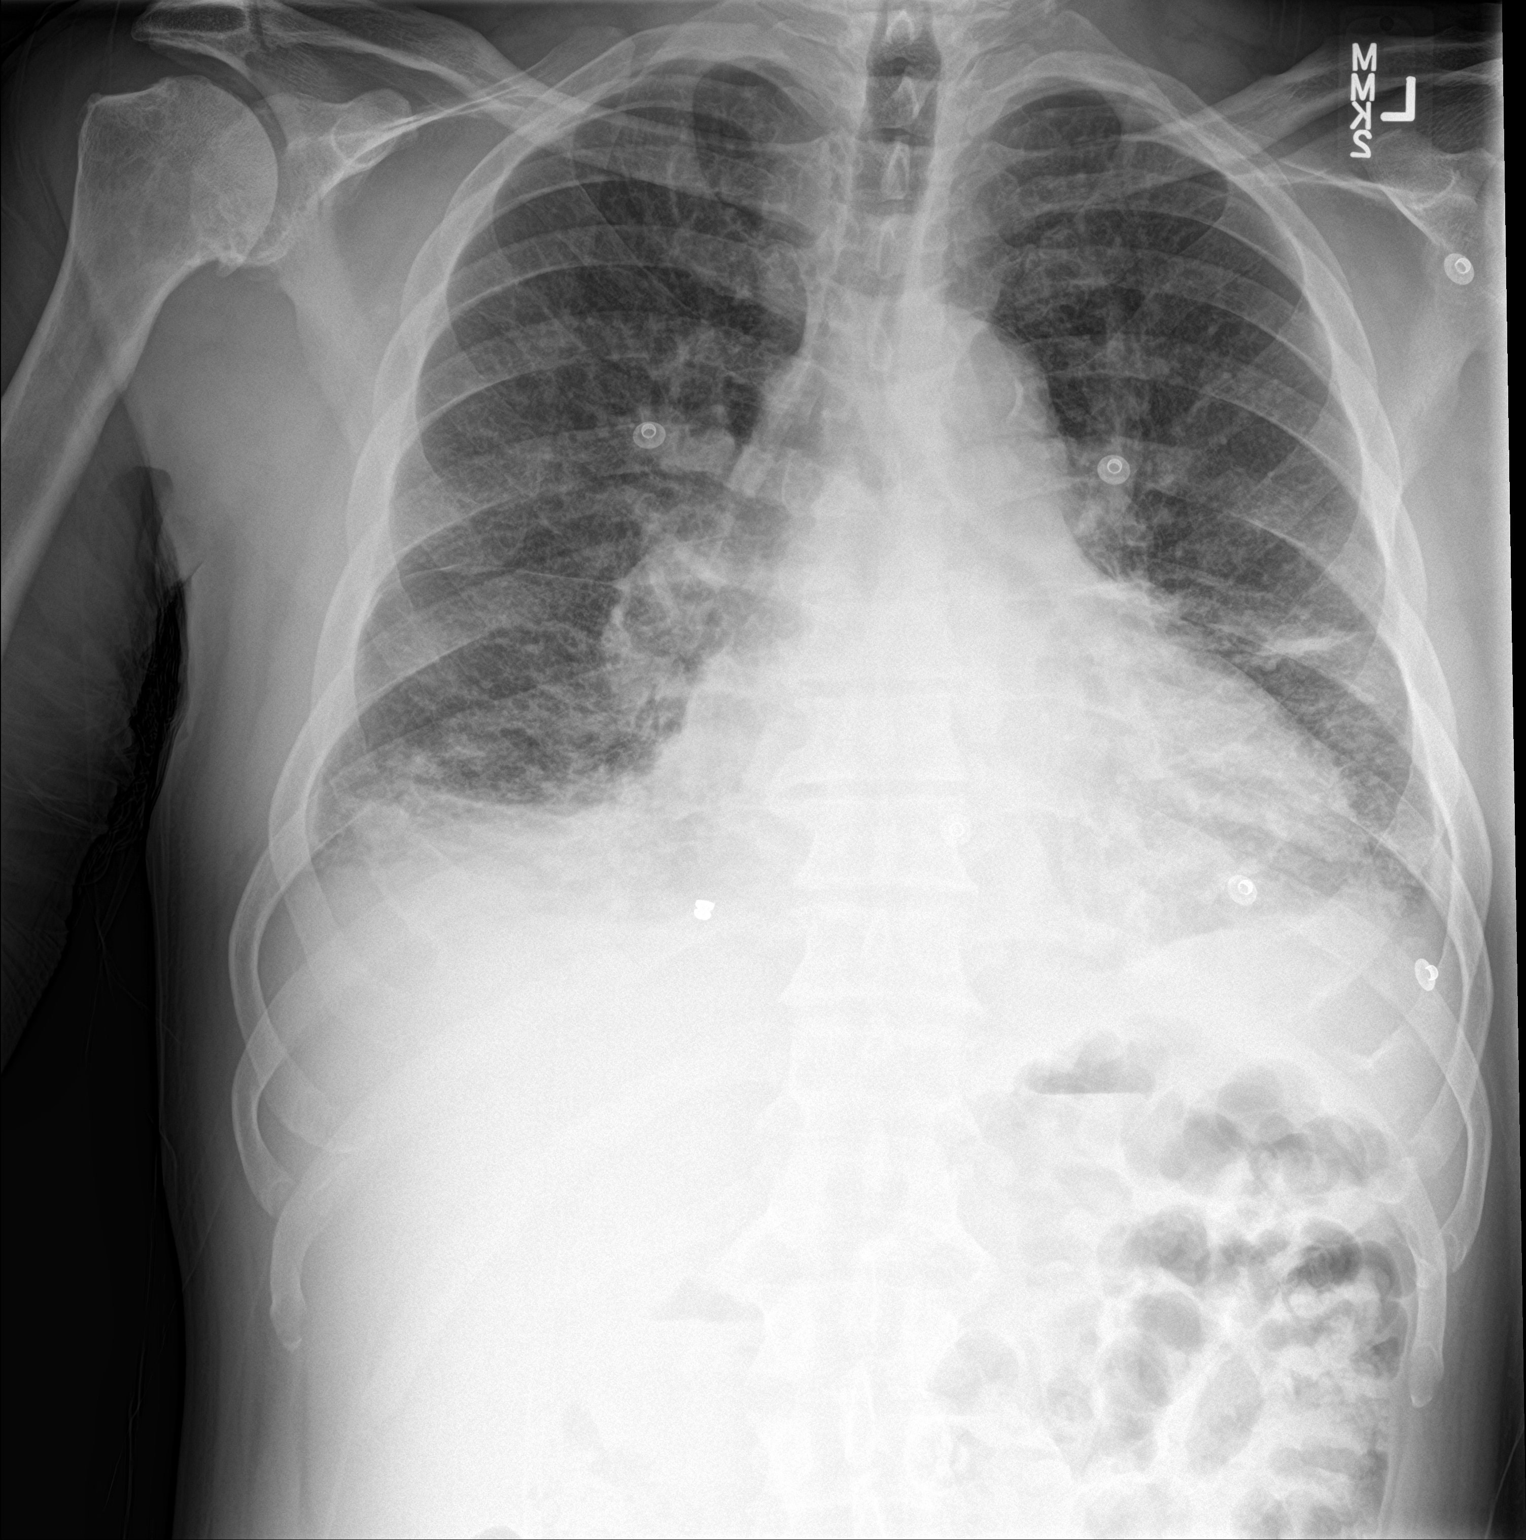

[chest lat]
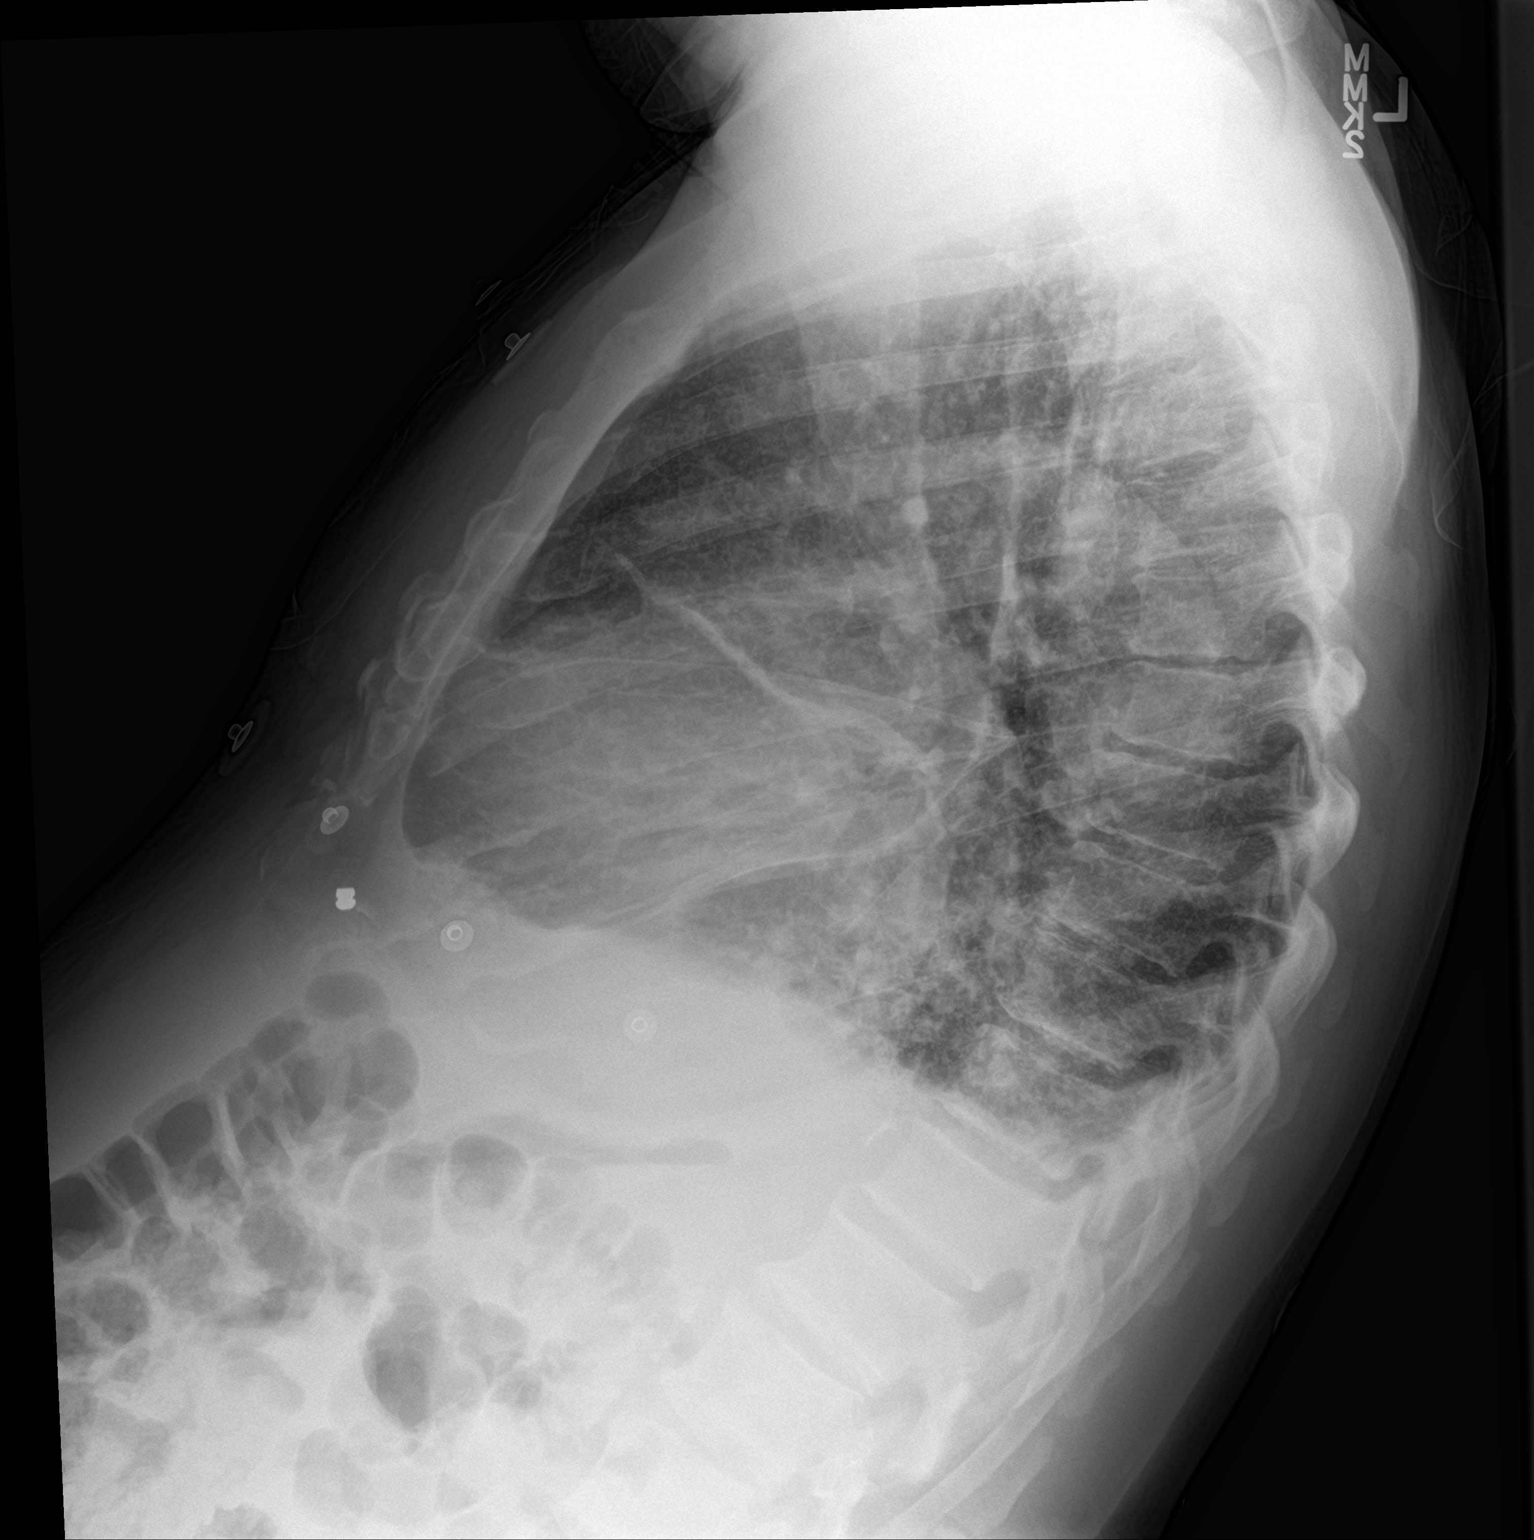

[2 of 2 positions shown; findings below may reference images not displayed]

FINDINGS: Development of small pleural effusions. Cardiomegaly with vascular
congestion. Bilateral interstitial and ground-glass opacity which
may reflect edema. Patchy consolidations at both bases. Aortic
atherosclerosis.
IMPRESSION: 1. Cardiomegaly with vascular congestion and underlying interstitial
and ground-glass opacities suspect for edema. There are small
pleural effusions
2. Increasing airspace disease at both bases may reflect atelectasis
or pneumonia

## 2019-02-28 MED ORDER — POTASSIUM CHLORIDE CRYS ER 20 MEQ PO TBCR
40.0000 meq | EXTENDED_RELEASE_TABLET | Freq: Every day | ORAL | Status: DC
  Administered 2019-02-28 – 2019-03-01 (×2): 40 meq via ORAL
  Filled 2019-02-28 (×2): qty 2

## 2019-02-28 MED ORDER — METOLAZONE 2.5 MG PO TABS
2.5000 mg | ORAL_TABLET | Freq: Every day | ORAL | Status: DC
  Administered 2019-02-28 – 2019-03-01 (×2): 2.5 mg via ORAL
  Filled 2019-02-28 (×2): qty 1

## 2019-02-28 NOTE — Progress Notes (Signed)
PROGRESS NOTE    [name]  [id number] DOB: [birth year] DOA: 02/10/2019 PCP: Patient, No Pcp Per    Brief Narrative:  58 year old male who presented after a fall.  Significant History of Systolic Heart Failure, Hypertension, Type 2 Diabetes Mellitus, Hep C, Polysubstance Abuse, and schizophrenia.  Apparently patient sustained a mechanical fall while using cocaine.  He has been noncompliant with his medications, has frequent hospitalizations, but he tends to leave the hospital AGAINST MEDICAL ADVICE.  On his initial physical examination he was somnolent, unable to give a detailed history, his blood pressure was 141/100, heart rate 101, respirate 17, oxygen saturation 98%.  Heart S1-S2 present, no significant wheezing, rhonchi, crackles, his abdomen was distended, positive significant lower extremity edema. Sodium 141, potassium 3.9, chloride 100, bicarb 30, glucose 181, creatinine 0.95, BNP 1,524, white count 7.8, hemoglobin 8.8, hematocrit 31.7, platelets 280.  SARS COVID-19 was negative.  Drug screen positive for cocaine.  His chest radiograph had bilateral interstitial infiltrates, bilateral small pleural effusions and cardiomegaly.  EKG 180 bpm, normal axis, normal intervals, sinus rhythm, no ST segment or T wave changes, positive PVCs, low voltage  Patient was admitted to the hospital working diagnosis acute cocaine intoxication complicated by acute on chronic systolic heart failure exacerbation, cardiogenic pulmonary edema.  Assessment & Plan:   Active Problems:   Essential hypertension   Cocaine abuse with cocaine-induced mood disorder (HCC)   Tobacco use   HLD (hyperlipidemia)   Diabetes mellitus without complication (HCC)   Acute on chronic combined systolic (congestive) and diastolic (congestive) heart failure (HCC)   1, Acute on chronic systolic heart failure exacerbation, complicated with acute cardiogenic pulmonary edema.  -Patient reports good urine output  -Still  complaining of orthopnea, requiring oxygen supplementation and have been significant fluid overload -Triggering cough most likely diet indiscretion, medication noncompliance and use of cocaine. -Continue daily weights, strict I's and O's and low-sodium diet -Will continue aggressive IV diuresis using Lasix and will start her metolazone.    2. HTN.  -Overall stable and well-controlled -Continue diuretics and lisinopril.   -Follow vital signs.  3. T2DM.  -Continue sliding scale insulin -Follow CBGs and follow the chills hypoglycemic regimen as needed. -Patient tolerating diet without problems.    4. Cocaine intoxication/ tobacco abuse.  -Will continue supportive medical care -Cessation counseling and education about importance of quitting has been provided -Continue nicotine patch.  5. Hypokalemia -In the setting of diuresis -Continue to monitor electrolytes and replete as needed.     DVT prophylaxis: enoxaparin   Code Status: full Family Communication: no family at the bedside Disposition Plan/ discharge barriers: pending clinical improvement.  Significant fluid overload, requiring 3 L nasal cannula oxygen supplementation and complaining of orthopnea.  Continue IV diuresis.  Body mass index is 36.48 kg/m.   Subjective: Patient denies chest pain, no nausea, no vomiting.  Continues to require 3 L nasal cannula supplementation and is expressing orthopnea.  Significant fluid overload appreciated on exam.  Objective: Vitals:   02/28/19 0013 02/28/19 0416 02/28/19 0753 02/28/19 1110  BP: (!) 132/96 139/90 (!) 144/88 (!) 147/103  Pulse: (!) 101 (!) 101 (!) 103 (!) 103  Resp: 20 20 19 20   Temp: 97.7 F (36.5 C) 97.9 F (36.6 C)  98.2 F (36.8 C)  TempSrc: Oral Oral  Oral  SpO2: 95% 94% 91% (!) 81%  Weight:  122 kg    Height:        Intake/Output Summary (Last 24 hours) at 02/28/2019 1258  Last data filed at 02/28/2019 1109 Gross per 24 hour  Intake 1902 ml  Output 2150 ml   Net -248 ml   Filed Weights   Mar 01, 2019 2141 02/27/19 0446 02/28/19 0416  Weight: 120 kg 117.5 kg 122 kg    Examination:  General exam: Alert, awake, oriented x 3; still complaining of orthopnea with significant fluid overload status on examination.  No chest pain.  Complaining of discomfort in his scrotum area.  No nausea, no vomiting.  Wearing 3 L nasal cannula supplementation Respiratory system: Positive rhonchi right, fine crackles at the bases, no use of accessory muscles.   Cardiovascular system:RRR. No rubs or gallops.  Positive mild JVD on examination. Gastrointestinal system: Abdomen is nontender, no guarding.  Bowel sounds.  Increased abdominal girth and pitting edema on both flanks appreciated on exam. Central nervous system: Alert and oriented. No focal neurological deficits. Extremities: No cyanosis; 2-3+ edema appreciated bilaterally extending all the way to his thighs Skin: No rashes, no petechiae.  Positive scrotal/penis edema Psychiatry: Judgement and insight appear normal. Mood & affect appropriate.    Data Reviewed: I have personally reviewed following labs and imaging studies  CBC: Recent Labs  Lab 02/21/19 1438 02/22/19 0510 02/26/19 0527 02/26/19 2341  WBC 8.3 6.1 7.8 6.6  NEUTROABS 6.6  --  5.9 4.5  HGB 8.8* 8.5* 8.8* 8.2*  HCT 32.5* 31.2* 31.7* 29.5*  MCV 76.7* 76.1* 75.7* 75.4*  PLT 385 339 280 417   Basic Metabolic Panel: Recent Labs  Lab 02/21/19 1438 02/22/19 0510 02/26/19 0527 02/27/19 0435 02/28/19 0405  NA 137 139 141 141 141  K 3.6 3.9 3.9 3.4* 3.9  CL 96* 99 100 98 98  CO2 28 32 30 34* 33*  GLUCOSE 337* 237* 181* 187* 195*  BUN 11 9 11 15 18   CREATININE 0.88 0.80 0.95 1.13 0.94  CALCIUM 8.9 9.0 8.7* 8.5* 8.7*  MG 1.8  --   --   --   --    GFR: Estimated Creatinine Clearance: 115.6 mL/min (by C-G formula based on SCr of 0.94 mg/dL).  CBG: Recent Labs  Lab 02/27/19 1528 02/27/19 2112 02/28/19 0620 02/28/19 0758 02/28/19  1107  GLUCAP 253* 131* 170* 159* 251*   Radiology Studies: I have reviewed all of the imaging during this hospital visit personally   Scheduled Meds: . ARIPiprazole  10 mg Oral Daily  . aspirin EC  81 mg Oral Daily  . carbamazepine  100 mg Oral BID  . enoxaparin (LOVENOX) injection  40 mg Subcutaneous Q24H  . furosemide  40 mg Intravenous Q12H  . insulin aspart  0-15 Units Subcutaneous TID WC  . insulin aspart  0-5 Units Subcutaneous QHS  . insulin aspart  3 Units Subcutaneous TID WC  . insulin glargine  30 Units Subcutaneous Daily  . lisinopril  5 mg Oral Daily  . metolazone  2.5 mg Oral Daily  . nicotine  14 mg Transdermal Daily  . potassium chloride  40 mEq Oral Daily  . sodium chloride flush  3 mL Intravenous Q12H   Continuous Infusions: . sodium chloride       LOS: 2 days    Barton Dubois, MD [phone]

## 2019-02-28 NOTE — Progress Notes (Signed)
Patient's O2 dropped to 72. Restarted patient on 3L/min.

## 2019-02-28 NOTE — Progress Notes (Signed)
Patient is alert an oriented with frequent request for food and drinks, patient on 02 n/c at 2L, general swollen all over. Plan is Diurese and educate on adhering to a low sodium diet.

## 2019-03-01 ENCOUNTER — Inpatient Hospital Stay (HOSPITAL_COMMUNITY): Payer: Medicare Other

## 2019-03-01 DIAGNOSIS — R601 Generalized edema: Secondary | ICD-10-CM

## 2019-03-01 LAB — POCT I-STAT 7, (LYTES, BLD GAS, ICA,H+H)
Bicarbonate: 30.8 mmol/L — ABNORMAL HIGH (ref 20.0–28.0)
Calcium, Ion: 1.19 mmol/L (ref 1.15–1.40)
HCT: 33 % — ABNORMAL LOW (ref 39.0–52.0)
Hemoglobin: 11.2 g/dL — ABNORMAL LOW (ref 13.0–17.0)
O2 Saturation: 87 %
Patient temperature: 99.4
Potassium: 3.7 mmol/L (ref 3.5–5.1)
Sodium: 139 mmol/L (ref 135–145)
TCO2: 34 mmol/L — ABNORMAL HIGH (ref 22–32)
pCO2 arterial: 92.3 mmHg (ref 32.0–48.0)
pH, Arterial: 7.134 — CL (ref 7.350–7.450)
pO2, Arterial: 75 mmHg — ABNORMAL LOW (ref 83.0–108.0)

## 2019-03-01 LAB — CBC
HCT: 33.7 % — ABNORMAL LOW (ref 39.0–52.0)
Hemoglobin: 8.9 g/dL — ABNORMAL LOW (ref 13.0–17.0)
MCH: 20.8 pg — ABNORMAL LOW (ref 26.0–34.0)
MCHC: 26.4 g/dL — ABNORMAL LOW (ref 30.0–36.0)
MCV: 78.7 fL — ABNORMAL LOW (ref 80.0–100.0)
Platelets: 236 10*3/uL (ref 150–400)
RBC: 4.28 MIL/uL (ref 4.22–5.81)
RDW: 22.5 % — ABNORMAL HIGH (ref 11.5–15.5)
WBC: 12.1 10*3/uL — ABNORMAL HIGH (ref 4.0–10.5)
nRBC: 0.6 % — ABNORMAL HIGH (ref 0.0–0.2)

## 2019-03-01 LAB — MAGNESIUM: Magnesium: 2 mg/dL (ref 1.7–2.4)

## 2019-03-01 LAB — GLUCOSE, CAPILLARY
Glucose-Capillary: 206 mg/dL — ABNORMAL HIGH (ref 70–99)
Glucose-Capillary: 203 mg/dL — ABNORMAL HIGH (ref 70–99)
Glucose-Capillary: 200 mg/dL — ABNORMAL HIGH (ref 70–99)
Glucose-Capillary: 145 mg/dL — ABNORMAL HIGH (ref 70–99)
Glucose-Capillary: 78 mg/dL (ref 70–99)

## 2019-03-01 LAB — BASIC METABOLIC PANEL
Anion gap: 9 (ref 5–15)
BUN: 18 mg/dL (ref 6–20)
CO2: 34 mmol/L — ABNORMAL HIGH (ref 22–32)
Calcium: 8.7 mg/dL — ABNORMAL LOW (ref 8.9–10.3)
Chloride: 96 mmol/L — ABNORMAL LOW (ref 98–111)
Creatinine, Ser: 1.04 mg/dL (ref 0.61–1.24)
GFR calc Af Amer: >60 mL/min (ref >60)
GFR calc non Af Amer: >60 mL/min (ref >60)
Glucose, Bld: 209 mg/dL — ABNORMAL HIGH (ref 70–99)
Potassium: 3.9 mmol/L (ref 3.5–5.1)
Sodium: 139 mmol/L (ref 135–145)

## 2019-03-01 MED ORDER — OXYCODONE-ACETAMINOPHEN 5-325 MG PO TABS
1.0000 | ORAL_TABLET | ORAL | Status: DC | PRN
  Administered 2019-03-01 (×2): 1 via ORAL
  Filled 2019-03-01 (×2): qty 1

## 2019-03-01 MED ORDER — METOLAZONE 2.5 MG PO TABS
2.5000 mg | ORAL_TABLET | Freq: Once | ORAL | Status: AC
  Administered 2019-03-01: 2.5 mg via ORAL
  Filled 2019-03-01: qty 1

## 2019-03-01 MED ORDER — FUROSEMIDE 10 MG/ML IJ SOLN
60.0000 mg | Freq: Two times a day (BID) | INTRAMUSCULAR | Status: DC
  Administered 2019-03-01: 60 mg via INTRAVENOUS
  Filled 2019-03-01: qty 6

## 2019-03-01 MED ORDER — METOLAZONE 5 MG PO TABS: 5.0000 mg | ORAL_TABLET | Freq: Every day | ORAL | Status: DC

## 2019-03-01 MED ORDER — CHLORHEXIDINE GLUCONATE CLOTH 2 % EX PADS: 6.0000 | MEDICATED_PAD | Freq: Every day | CUTANEOUS | Status: DC

## 2019-03-01 MED ORDER — NALOXONE HCL 0.4 MG/ML IJ SOLN
0.4000 mg | INTRAMUSCULAR | Status: DC | PRN
  Administered 2019-03-02: 0.4 mg via INTRAVENOUS
  Filled 2019-03-01 (×2): qty 1

## 2019-03-01 MED ORDER — NALOXONE HCL 0.4 MG/ML IJ SOLN
INTRAMUSCULAR | Status: AC
  Filled 2019-03-01: qty 1

## 2019-03-01 NOTE — Plan of Care (Signed)
  Problem: Nutrition: Goal: Adequate nutrition will be maintained Outcome: Completed/Met   Problem: Elimination: Goal: Will not experience complications related to bowel motility Outcome: Completed/Met   

## 2019-03-01 NOTE — Progress Notes (Signed)
Called to bedside by RN for active Code Blue. On arrival patient being transferred to [facility] for PCCM management. Further management by PCCM.  Lovey Newcomer, NP [facility] [phone]

## 2019-03-01 NOTE — Care Management Important Message (Signed)
Important Message  Patient Details  Name: [name] MRN: [id number] Date of Birth: 17-Aug-1960   Medicare Important Message Given:  Yes     [name] 03/01/2019, 1:20 PM

## 2019-03-01 NOTE — Progress Notes (Signed)
PROGRESS NOTE    Taylor MemosFrederick L [name]  ZOX:[id number] DOB: 1961/02/19 DOA: 01/30/2019 PCP: Patient, No Pcp Per    Brief Narrative:  58 year old male who presented after a fall.  Significant History of Systolic Heart Failure, Hypertension, Type 2 Diabetes Mellitus, Hep C, Polysubstance Abuse, and schizophrenia.  Apparently patient sustained a mechanical fall while using cocaine.  He has been noncompliant with his medications, has frequent hospitalizations, but he tends to leave the hospital AGAINST MEDICAL ADVICE.  On his initial physical examination he was somnolent, unable to give a detailed history, his blood pressure was 141/100, heart rate 101, respirate 17, oxygen saturation 98%.  Heart S1-S2 present, no significant wheezing, rhonchi, crackles, his abdomen was distended, positive significant lower extremity edema. Sodium 141, potassium 3.9, chloride 100, bicarb 30, glucose 181, creatinine 0.95, BNP 1,524, white count 7.8, hemoglobin 8.8, hematocrit 31.7, platelets 280.  SARS COVID-19 was negative.  Drug screen positive for cocaine.  His chest radiograph had bilateral interstitial infiltrates, bilateral small pleural effusions and cardiomegaly.  EKG 180 bpm, normal axis, normal intervals, sinus rhythm, no ST segment or T wave changes, positive PVCs, low voltage  Patient was admitted to the hospital working diagnosis acute cocaine intoxication complicated by acute on chronic systolic heart failure exacerbation, cardiogenic pulmonary edema.   Assessment & Plan:   Active Problems:   Essential hypertension   Cocaine abuse with cocaine-induced mood disorder (HCC)   Tobacco use   HLD (hyperlipidemia)   Diabetes mellitus without complication (HCC)   Acute on chronic combined systolic (congestive) and diastolic (congestive) heart failure (HCC)   1, Acute on chronic systolic heart failure exacerbation, complicated with acute cardiogenic pulmonary edema/ EF 25 to 30%, mildly dilated cavity,  moderate LVH/ RV with severely reduced systolic function. Urine output over last 24 H is  2,950 ml. Blood pressure 148 mmHg. Patient has persistent hypervolemia, will increase furosemide to  IV 60 mg IV q12 H, and metolazone to 5 mg daily. Continue blood pressure monitoring.   2. HTN.  Low dose lisinopril, continue aggressive diuresis with furosemide and metolazone.   3. T2DM. Fasting glucose this am 209, will continue glucose cover and monitoring with insulin sliding scale, plus a basal insulin 30 units daily.   4. Cocaine intoxication/ tobacco abuse. Continue nicotine patch. Nuero checks peer unit protocol. No agitation or confusion.   5. Hypokalemia. K this am 3,9, will continue k monitoring, follow on renal panel in am. Preserved renal function with serum ct at 1,0.   6. Anxiety/ depression. Continue with aripirazole, carbamazepine,   DVT prophylaxis: enoxaparin   Code Status: full Family Communication: no family at the bedside Disposition Plan/ discharge barriers: pending clinical improvement     Body mass index is 35.13 kg/m. Malnutrition Type:      Malnutrition Characteristics:      Nutrition Interventions:     RN Pressure Injury Documentation:     Consultants:     Procedures:     Antimicrobials:       Subjective: Patient continue to have lower extremity and genital edema, no nausea or vomiting, dyspnea is improving but not yet back to baseline.   Objective: Vitals:   03/01/19 0500 03/01/19 0604 03/01/19 0624 03/01/19 0805  BP:  138/86  (!) 142/87  Pulse:  99  (!) 102  Resp:  20  18  Temp:  97.6 F (36.4 C)  98.8 F (37.1 C)  TempSrc:  Oral  Oral  SpO2:  (!) 86% 96% 100%  Weight:  117.5 kg     Height:        Intake/Output Summary (Last 24 hours) at 03/01/2019 1205 Last data filed at 03/01/2019 1114 Gross per 24 hour  Intake 1583 ml  Output 2426 ml  Net -843 ml   Filed Weights   02/27/19 0446 02/28/19 0416 03/01/19 0500  Weight:  117.5 kg 122 kg 117.5 kg    Examination:   General: Not in pain or dyspnea, deconditioned  Neurology: Awake and alert, non focal  E ENT: mild pallor, no icterus, oral mucosa moist Cardiovascular: No JVD. S1-S2 present, rhythmic, no gallops, rubs, or murmurs. +++ pitting lower extremity edema. Pulmonary: positive breath sounds bilaterally, decreased air movement at bases, no wheezing, rhonchi or rales. Gastrointestinal. Abdomen protuberant with no organomegaly, non tender, no rebound or guarding Skin. No rashes Musculoskeletal: no joint deformities Positive scrotal edema     Data Reviewed: I have personally reviewed following labs and imaging studies  CBC: Recent Labs  Lab 02/26/19 0527 02/26/19 2341  WBC 7.8 6.6  NEUTROABS 5.9 4.5  HGB 8.8* 8.2*  HCT 31.7* 29.5*  MCV 75.7* 75.4*  PLT 280 528   Basic Metabolic Panel: Recent Labs  Lab 02/26/19 0527 02/27/19 0435 02/28/19 0405 03/01/19 0551  NA 141 141 141 139  K 3.9 3.4* 3.9 3.9  CL 100 98 98 96*  CO2 30 34* 33* 34*  GLUCOSE 181* 187* 195* 209*  BUN 11 15 18 18   CREATININE 0.95 1.13 0.94 1.04  CALCIUM 8.7* 8.5* 8.7* 8.7*  MG  --   --   --  2.0   GFR: Estimated Creatinine Clearance: 102.5 mL/min (by C-G formula based on SCr of 1.04 mg/dL). Liver Function Tests: No results for input(s): AST, ALT, ALKPHOS, BILITOT, PROT, ALBUMIN in the last 168 hours. No results for input(s): LIPASE, AMYLASE in the last 168 hours. No results for input(s): AMMONIA in the last 168 hours. Coagulation Profile: No results for input(s): INR, PROTIME in the last 168 hours. Cardiac Enzymes: No results for input(s): CKTOTAL, CKMB, CKMBINDEX, TROPONINI in the last 168 hours. BNP (last 3 results) No results for input(s): PROBNP in the last 8760 hours. HbA1C: No results for input(s): HGBA1C in the last 72 hours. CBG: Recent Labs  Lab 02/28/19 0758 02/28/19 1107 02/28/19 1614 02/28/19 2118 03/01/19 0633  GLUCAP 159* 251* 149* 237*  206*   Lipid Profile: No results for input(s): CHOL, HDL, LDLCALC, TRIG, CHOLHDL, LDLDIRECT in the last 72 hours. Thyroid Function Tests: No results for input(s): TSH, T4TOTAL, FREET4, T3FREE, THYROIDAB in the last 72 hours. Anemia Panel: No results for input(s): VITAMINB12, FOLATE, FERRITIN, TIBC, IRON, RETICCTPCT in the last 72 hours.    Radiology Studies: I have reviewed all of the imaging during this hospital visit personally     Scheduled Meds: . ARIPiprazole  10 mg Oral Daily  . aspirin EC  81 mg Oral Daily  . carbamazepine  100 mg Oral BID  . enoxaparin (LOVENOX) injection  40 mg Subcutaneous Q24H  . furosemide  40 mg Intravenous Q12H  . insulin aspart  0-15 Units Subcutaneous TID WC  . insulin aspart  0-5 Units Subcutaneous QHS  . insulin aspart  3 Units Subcutaneous TID WC  . insulin glargine  30 Units Subcutaneous Daily  . lisinopril  5 mg Oral Daily  . metolazone  2.5 mg Oral Daily  . nicotine  14 mg Transdermal Daily  . potassium chloride  40 mEq Oral Daily  . sodium chloride flush  3 mL Intravenous Q12H   Continuous Infusions: . sodium chloride       LOS: 3 days        Shalayne Leach Annett Gula, MD

## 2019-03-01 NOTE — ED Provider Notes (Signed)
[facility]-EMERGENCY DEPT Provider Note   CSN: [id number] Arrival date & time: 01/30/2019  1103     History   Chief Complaint Chief Complaint  Patient presents with  . Leg Pain    HPI [name] is a 58 y.o. male.     HPI   58 year old male with lower extremity edema/pain.  Patient is well-known to the emergency room.  Frequent ED visitor is often admitted.  He is noncompliant with medications.  He reports worsening leg pain over the past several days.  Denies acute trauma or strain.  Past Medical History:  Diagnosis Date  . Chronic foot pain   . Cocaine abuse (HCC)   . Diabetes mellitus without complication (HCC)   . Hepatitis C    unsure   . Homelessness   . Hypertension   . Neuropathy   . Polysubstance abuse (HCC)   . Schizophrenia (HCC)   . Sleep apnea   . Systolic and diastolic CHF, chronic [facility](HCC)     Patient Active Problem List   Diagnosis Date Noted  . CHF (congestive heart failure), NYHA class III, acute on chronic, combined (HCC) 02/02/2019  . Scrotal edema 01/30/2019  . Combined systolic and diastolic heart failure (HCC) 01/19/2019  . Chest pain 01/16/2019  . 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  . Medically noncompliant   . Acute on chronic systolic CHF (congestive heart failure) (HCC) 01/02/2019  . 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 Acute on chronic systolic heart failure (HCC) 12/26/2018  . Mobitz type II atrioventricular block 12/25/2018  . Acute exacerbation of CHF (congestive heart failure) (HCC) 12/22/2018  . Acute on chronic respiratory failure with hypoxia (HCC) 12/22/2018  . Acute CHF (congestive heart failure) (HCC) 12/16/2018  . Diabetes mellitus without complication (HCC) 12/11/2018  . Acute on chronic systolic (congestive) heart failure (HCC) 12/11/2018  . Abdominal pain 12/11/2018  . Nausea vomiting and diarrhea 12/11/2018  . Microcytic anemia  12/11/2018  . Evaluation by psychiatric service required   . MDD (major depressive disorder), severe (HCC) 11/25/2018  . Pressure injury of skin 11/08/2018  . Elevated troponin 10/18/2018  . HLD (hyperlipidemia) 10/18/2018  . Anxiety 10/18/2018  . CHF (congestive heart failure), NYHA class II, acute on chronic, combined (HCC) 10/18/2018  . Hypertensive urgency 10/12/2018  . Mild renal insufficiency 10/12/2018  . Cellulitis 10/12/2018  . Penile cellulitis   . Adjustment disorder with mixed disturbance of emotions and conduct   . Sleep apnea 10/03/2018  . Hypokalemia 09/17/2018  . Scrotal swelling 09/17/2018  . Urinary hesitancy   . Constipation   . Acute on chronic combined systolic and diastolic CHF (congestive heart failure) (HCC) 08/25/2018  . Acute respiratory failure with hypoxia (HCC) 08/25/2018  . Tobacco use 08/18/2018  . Homelessness 08/08/2018  . Smoker 08/08/2018  . Prostate enlargement 03/16/2018  . Aortic atherosclerosis (HCC) 03/16/2018  . 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 Cocaine abuse with cocaine-induced mood disorder (HCC) 09/18/2017  . Chronic foot pain   . Schizoaffective disorder, bipolar type (HCC) 09/30/2016  . Substance induced mood disorder (HCC) 03/13/2015  . Schizophrenia, paranoid type (HCC) 01/17/2015  . Suicidal ideation   . Drug hallucinosis (HCC) 10/08/2014  . Chronic paranoid schizophrenia (HCC) 09/07/2014  . 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 Urinary retention   . Cocaine use disorder, severe, dependence (HCC)   . Essential hypertension 03/28/2013  .  Type 2 diabetes mellitus (HCC) 03/15/2013    Past Surgical History:  Procedure Laterality Date  . MULTIPLE TOOTH EXTRACTIONS          Home Medications    Prior to Admission medications   Medication Sig Start Date End Date Taking? Authorizing Provider  acetaminophen (TYLENOL) 325 MG tablet Take 2  tablets (650 mg total) by mouth every 8 (eight) hours as needed. 2019/03/21   Raeford Razor, MD  furosemide (LASIX) 40 MG tablet Take 1 tablet (40 mg total) by mouth 2 (two) times daily. 03-21-19 03/27/19  Raeford Razor, MD  glipiZIDE (GLUCOTROL) 5 MG tablet Take 0.5 tablets (2.5 mg total) by mouth 2 (two) times daily before a meal. 03-21-2019   Raeford Razor, MD  Lidocaine, Anorectal, 5 % CREA Apply to sore on penis as needed for pain. 02/20/19   Molpus, John, MD    Family History Family History  Problem Relation Age of Onset  . Hypertension Other   . Diabetes Other     Social History Social History   Tobacco Use  . Smoking status: Current Every Day Smoker    Packs/day: 1.00    Years: 20.00    Pack years: 20.00    Types: Cigarettes  . Smokeless tobacco: Current User  Substance Use Topics  . Alcohol use: Yes    Comment: Daily Drinker   . Drug use: Yes    Frequency: 7.0 times per week    Types: "Crack" cocaine, Cocaine, Marijuana    Comment: Crack     Allergies   Haldol [haloperidol]   Review of Systems Review of Systems All systems reviewed and negative, other than as noted in HPI.   Physical Exam Updated Vital Signs BP (!) 190/110   Pulse 94   Temp 99 F (37.2 C) (Tympanic)   Resp 20   Ht 6' (1.829 m)   Wt 119.8 kg   SpO2 96%   BMI 35.82 kg/m   Physical Exam Vitals signs and nursing note reviewed.  Constitutional:      General: He is not in acute distress.    Appearance: He is well-developed.     Comments: Sitting in chair.  No acute distress.  HENT:     Head: Normocephalic and atraumatic.  Eyes:     General:        Right eye: No discharge.        Left eye: No discharge.     Conjunctiva/sclera: Conjunctivae normal.  Neck:     Musculoskeletal: Neck supple.  Cardiovascular:     Rate and Rhythm: Normal rate and regular rhythm.     Heart sounds: Normal heart sounds. No murmur. No friction rub. No gallop.   Pulmonary:     Effort: Pulmonary effort is  normal. No respiratory distress.     Breath sounds: Normal breath sounds.  Abdominal:     General: There is no distension.     Palpations: Abdomen is soft.     Tenderness: There is no abdominal tenderness.  Musculoskeletal:     Comments: Severe, roughly symmetric lower extremity edema.  Skin:    General: Skin is warm and dry.  Neurological:     Mental Status: He is alert.  Psychiatric:        Behavior: Behavior normal.        Thought Content: Thought content normal.      ED Treatments / Results  Labs (all labs ordered are listed, but only abnormal results are displayed) Labs Reviewed -  No data to display  EKG None  Radiology No results found.  Procedures Procedures (including critical care time)  Medications Ordered in ED Medications  acetaminophen (TYLENOL) tablet 650 mg (650 mg Oral Given 02/21/2019 1237)  furosemide (LASIX) tablet 40 mg (40 mg Oral Given 02/10/2019 1237)     Initial Impression / Assessment and Plan / ED Course  I have reviewed the triage vital signs and the nursing notes.  Pertinent labs & imaging results that were available during my care of the patient were reviewed by me and considered in my medical decision making (see chart for details).       Mr. Melucci is well-known to the emergency room.  He averages an ER visit every 36 hours and hospital admission once a week.  He is noncompliant with recommendations.  Today, he appears to be in no acute distress.  He will be given a dose of his home medications.  Tried to encourage him.  Outpatient follow-up otherwise.  Final Clinical Impressions(s) / ED Diagnoses   Final diagnoses:  Leg edema  Noncompliance with medications    ED Discharge Orders         Ordered    furosemide (LASIX) 40 MG tablet  2 times daily     02/07/2019 1216    glipiZIDE (GLUCOTROL) 5 MG tablet  2 times daily before meals     02/11/2019 1216    acetaminophen (TYLENOL) 325 MG tablet  Every 8 hours PRN     02/20/2019 1216            Virgel Manifold, MD 03/01/19 1148

## 2019-03-01 NOTE — Progress Notes (Signed)
Inpatient Diabetes Program Recommendations  AACE/ADA: New Consensus Statement on Inpatient Glycemic Control (2015)  Target Ranges:  Prepandial:   less than 140 mg/dL      Peak postprandial:   less than 180 mg/dL (1-2 hours)      Critically ill patients:  140 - 180 mg/dL   Lab Results  Component Value Date   GLUCAP 237 (H) 02/28/2019   HGBA1C 9.7 (H) 12/04/2018    Review of Glycemic Control Results for [name], [name] (MRN [id number]) as of 03/01/2019 10:38  Ref. Range 02/28/2019 06:20 02/28/2019 07:58 02/28/2019 11:07 02/28/2019 16:14 02/28/2019 21:18  Glucose-Capillary Latest Ref Range: 70 - 99 mg/dL 170 (H) 159 (H) 251 (H) 149 (H) 237 (H)   History: DM, Homelessness, Schizophrenia, CHF, Polysubstance Abuse   Home DM Meds: Glipizide 2.5 mg BID   Current Orders: Lantus 5 units daily                            Novolog Moderate Correction Scale/ SSI (0-15 units) TID AC + HS  Inpatient Diabetes Program Recommendations:    Patient has been counseled multiple times by DM coordinators on past admissions. Patient noted on 02/22/19 (Per Record Review, This is pt's 30th admission since January and pt has been to the ED 105 times since January.) Glucose control continues to be difficult as an outpatient given social issues.  Will follow during hospitalization.  Thank you, Nani Gasser. Arnez Stoneking, RN, MSN, CDE  Diabetes Coordinator Inpatient Glycemic Control Team Team Pager [phone] (8am-5pm) 03/01/2019 10:46 AM

## 2019-03-01 NOTE — Code Documentation (Signed)
.   Patient Name: [name]   MRN: [id number]   Date of Birth/ Sex: 06/21/1961 , male      Admission Date: 02/21/2019  Attending Provider: Tawni Millers,*  Primary Diagnosis: <principal problem not specified>   Indication: Pt was in his usual state of health until this PM, when he was noted to be asystole. Code blue was subsequently called. At the time of arrival on scene, ACLS protocol was underway.   Technical Description:  - CPR performance duration:  14 minutes  - Was defibrillation or cardioversion used? No   - Was external pacer placed? No  - Was patient intubated pre/post CPR? Yes   Medications Administered: Y = Yes; Blank = No Amiodarone    Atropine    Calcium    Epinephrine  Y  Lidocaine    Magnesium    Norepinephrine    Phenylephrine    Sodium bicarbonate    Vasopressin     Post CPR evaluation:  - Final Status - Was patient successfully resuscitated ? Yes  Miscellaneous Information:  - Labs sent, including: CBC,BMP, lactic acid, ABG, Chest xray  - Primary team notified?  Yes  - Family Notified? No  - Additional notes/ transfer status: Transferred to [place], MD  03/01/2019, 11:40 PM

## 2019-03-01 NOTE — Plan of Care (Signed)
Patient is impulsive, fluctuating behaviors on 02 sat 2l [address], SOB edema and scrotal  support eating very well.

## 2019-03-01 NOTE — Plan of Care (Signed)
  Problem: Education: Goal: Knowledge of General Education information will improve Description: Including pain rating scale, medication(s)/side effects and non-pharmacologic comfort measures Outcome: Progressing   Problem: Clinical Measurements: Goal: Respiratory complications will improve Outcome: Progressing   

## 2019-03-01 NOTE — Plan of Care (Signed)
  Problem: Activity: Goal: Risk for activity intolerance will decrease Outcome: Progressing Note: Limited mobility d/t swelling up to the chair with 1-2 assist    Problem: Clinical Measurements: Goal: Cardiovascular complication will be avoided Note: Increase Iv Diuretic and decreased fluid intake

## 2019-03-02 ENCOUNTER — Inpatient Hospital Stay (HOSPITAL_COMMUNITY): Payer: Medicare Other

## 2019-03-02 DIAGNOSIS — I5043 Acute on chronic combined systolic (congestive) and diastolic (congestive) heart failure: Secondary | ICD-10-CM

## 2019-03-02 DIAGNOSIS — R579 Shock, unspecified: Secondary | ICD-10-CM

## 2019-03-02 DIAGNOSIS — G931 Anoxic brain damage, not elsewhere classified: Secondary | ICD-10-CM

## 2019-03-02 DIAGNOSIS — J810 Acute pulmonary edema: Secondary | ICD-10-CM

## 2019-03-02 DIAGNOSIS — I469 Cardiac arrest, cause unspecified: Secondary | ICD-10-CM | POA: Diagnosis not present

## 2019-03-02 DIAGNOSIS — J9621 Acute and chronic respiratory failure with hypoxia: Secondary | ICD-10-CM

## 2019-03-02 DIAGNOSIS — R9431 Abnormal electrocardiogram [ECG] [EKG]: Secondary | ICD-10-CM

## 2019-03-02 DIAGNOSIS — I351 Nonrheumatic aortic (valve) insufficiency: Secondary | ICD-10-CM

## 2019-03-02 LAB — APTT
aPTT: 33 seconds (ref 24–36)
aPTT: 31 seconds (ref 24–36)

## 2019-03-02 LAB — CBC WITH DIFFERENTIAL/PLATELET
Abs Immature Granulocytes: 0.07 10*3/uL (ref 0.00–0.07)
Basophils Absolute: 0.1 10*3/uL (ref 0.0–0.1)
Basophils Relative: 1 %
Eosinophils Absolute: 0.4 10*3/uL (ref 0.0–0.5)
Eosinophils Relative: 4 %
HCT: 33.2 % — ABNORMAL LOW (ref 39.0–52.0)
Hemoglobin: 9 g/dL — ABNORMAL LOW (ref 13.0–17.0)
Immature Granulocytes: 1 %
Lymphocytes Relative: 15 %
Lymphs Abs: 1.9 10*3/uL (ref 0.7–4.0)
MCH: 21.3 pg — ABNORMAL LOW (ref 26.0–34.0)
MCHC: 27.1 g/dL — ABNORMAL LOW (ref 30.0–36.0)
MCV: 78.7 fL — ABNORMAL LOW (ref 80.0–100.0)
Monocytes Absolute: 1.5 10*3/uL — ABNORMAL HIGH (ref 0.1–1.0)
Monocytes Relative: 12 %
Neutro Abs: 8.2 10*3/uL — ABNORMAL HIGH (ref 1.7–7.7)
Neutrophils Relative %: 67 %
Platelets: 239 10*3/uL (ref 150–400)
RBC: 4.22 MIL/uL (ref 4.22–5.81)
RDW: 22.7 % — ABNORMAL HIGH (ref 11.5–15.5)
WBC: 12.1 10*3/uL — ABNORMAL HIGH (ref 4.0–10.5)
nRBC: 0.5 % — ABNORMAL HIGH (ref 0.0–0.2)

## 2019-03-02 LAB — POCT I-STAT 7, (LYTES, BLD GAS, ICA,H+H)
Acid-Base Excess: 9 mmol/L — ABNORMAL HIGH (ref 0.0–2.0)
Acid-Base Excess: 10 mmol/L — ABNORMAL HIGH (ref 0.0–2.0)
Acid-Base Excess: 19 mmol/L — ABNORMAL HIGH (ref 0.0–2.0)
Acid-Base Excess: 18 mmol/L — ABNORMAL HIGH (ref 0.0–2.0)
Bicarbonate: 35.4 mmol/L — ABNORMAL HIGH (ref 20.0–28.0)
Bicarbonate: 33.5 mmol/L — ABNORMAL HIGH (ref 20.0–28.0)
Bicarbonate: 41.2 mmol/L — ABNORMAL HIGH (ref 20.0–28.0)
Bicarbonate: 43.6 mmol/L — ABNORMAL HIGH (ref 20.0–28.0)
Calcium, Ion: 1.2 mmol/L (ref 1.15–1.40)
Calcium, Ion: 1.14 mmol/L — ABNORMAL LOW (ref 1.15–1.40)
Calcium, Ion: 1.14 mmol/L — ABNORMAL LOW (ref 1.15–1.40)
Calcium, Ion: 1.17 mmol/L (ref 1.15–1.40)
HCT: 31 % — ABNORMAL LOW (ref 39.0–52.0)
HCT: 36 % — ABNORMAL LOW (ref 39.0–52.0)
HCT: 36 % — ABNORMAL LOW (ref 39.0–52.0)
HCT: 36 % — ABNORMAL LOW (ref 39.0–52.0)
Hemoglobin: 10.5 g/dL — ABNORMAL LOW (ref 13.0–17.0)
Hemoglobin: 12.2 g/dL — ABNORMAL LOW (ref 13.0–17.0)
Hemoglobin: 12.2 g/dL — ABNORMAL LOW (ref 13.0–17.0)
Hemoglobin: 12.2 g/dL — ABNORMAL LOW (ref 13.0–17.0)
O2 Saturation: 92 %
O2 Saturation: 95 %
O2 Saturation: 93 %
O2 Saturation: 100 %
Patient temperature: 98.8
Patient temperature: 34.1
Patient temperature: 33.1
Patient temperature: 33.1
Potassium: 3.7 mmol/L (ref 3.5–5.1)
Potassium: 3.4 mmol/L — ABNORMAL LOW (ref 3.5–5.1)
Potassium: 2.8 mmol/L — ABNORMAL LOW (ref 3.5–5.1)
Potassium: 3.1 mmol/L — ABNORMAL LOW (ref 3.5–5.1)
Sodium: 141 mmol/L (ref 135–145)
Sodium: 141 mmol/L (ref 135–145)
Sodium: 142 mmol/L (ref 135–145)
Sodium: 142 mmol/L (ref 135–145)
TCO2: 37 mmol/L — ABNORMAL HIGH (ref 22–32)
TCO2: 35 mmol/L — ABNORMAL HIGH (ref 22–32)
TCO2: 42 mmol/L — ABNORMAL HIGH (ref 22–32)
TCO2: 45 mmol/L — ABNORMAL HIGH (ref 22–32)
pCO2 arterial: 56.4 mmHg — ABNORMAL HIGH (ref 32.0–48.0)
pCO2 arterial: 36 mmHg (ref 32.0–48.0)
pCO2 arterial: 32.3 mmHg (ref 32.0–48.0)
pCO2 arterial: 43.5 mmHg (ref 32.0–48.0)
pH, Arterial: 7.407 (ref 7.350–7.450)
pH, Arterial: 7.567 — ABNORMAL HIGH (ref 7.350–7.450)
pH, Arterial: 7.702 (ref 7.350–7.450)
pH, Arterial: 7.596 — ABNORMAL HIGH (ref 7.350–7.450)
pO2, Arterial: 65 mmHg — ABNORMAL LOW (ref 83.0–108.0)
pO2, Arterial: 56 mmHg — ABNORMAL LOW (ref 83.0–108.0)
pO2, Arterial: 40 mmHg — CL (ref 83.0–108.0)
pO2, Arterial: 223 mmHg — ABNORMAL HIGH (ref 83.0–108.0)

## 2019-03-02 LAB — COOXEMETRY PANEL
Carboxyhemoglobin: 2 % — ABNORMAL HIGH (ref 0.5–1.5)
Methemoglobin: 0.6 % (ref 0.0–1.5)
O2 Saturation: 61.4 %
Total hemoglobin: 9.9 g/dL — ABNORMAL LOW (ref 12.0–16.0)

## 2019-03-02 LAB — BASIC METABOLIC PANEL
Anion gap: 12 (ref 5–15)
Anion gap: 6 (ref 5–15)
Anion gap: 7 (ref 5–15)
Anion gap: 14 (ref 5–15)
BUN: 19 mg/dL (ref 6–20)
BUN: 20 mg/dL (ref 6–20)
BUN: 20 mg/dL (ref 6–20)
BUN: 24 mg/dL — ABNORMAL HIGH (ref 6–20)
CO2: 33 mmol/L — ABNORMAL HIGH (ref 22–32)
CO2: 31 mmol/L (ref 22–32)
CO2: 31 mmol/L (ref 22–32)
CO2: 29 mmol/L (ref 22–32)
Calcium: 9.1 mg/dL (ref 8.9–10.3)
Calcium: 8.5 mg/dL — ABNORMAL LOW (ref 8.9–10.3)
Calcium: 8.7 mg/dL — ABNORMAL LOW (ref 8.9–10.3)
Calcium: 8.8 mg/dL — ABNORMAL LOW (ref 8.9–10.3)
Chloride: 93 mmol/L — ABNORMAL LOW (ref 98–111)
Chloride: 96 mmol/L — ABNORMAL LOW (ref 98–111)
Chloride: 96 mmol/L — ABNORMAL LOW (ref 98–111)
Chloride: 97 mmol/L — ABNORMAL LOW (ref 98–111)
Creatinine, Ser: 0.99 mg/dL (ref 0.61–1.24)
Creatinine, Ser: 1.2 mg/dL (ref 0.61–1.24)
Creatinine, Ser: 1.22 mg/dL (ref 0.61–1.24)
Creatinine, Ser: 1.16 mg/dL (ref 0.61–1.24)
GFR calc Af Amer: >60 mL/min (ref >60)
GFR calc Af Amer: >60 mL/min (ref >60)
GFR calc Af Amer: >60 mL/min (ref >60)
GFR calc Af Amer: >60 mL/min (ref >60)
GFR calc non Af Amer: >60 mL/min (ref >60)
GFR calc non Af Amer: >60 mL/min (ref >60)
GFR calc non Af Amer: >60 mL/min (ref >60)
GFR calc non Af Amer: >60 mL/min (ref >60)
Glucose, Bld: 80 mg/dL (ref 70–99)
Glucose, Bld: 84 mg/dL (ref 70–99)
Glucose, Bld: 85 mg/dL (ref 70–99)
Glucose, Bld: 122 mg/dL — ABNORMAL HIGH (ref 70–99)
Potassium: 6.2 mmol/L — ABNORMAL HIGH (ref 3.5–5.1)
Potassium: 3.9 mmol/L (ref 3.5–5.1)
Potassium: 4 mmol/L (ref 3.5–5.1)
Potassium: 3 mmol/L — ABNORMAL LOW (ref 3.5–5.1)
Sodium: 138 mmol/L (ref 135–145)
Sodium: 133 mmol/L — ABNORMAL LOW (ref 135–145)
Sodium: 134 mmol/L — ABNORMAL LOW (ref 135–145)
Sodium: 140 mmol/L (ref 135–145)

## 2019-03-02 LAB — POCT I-STAT, CHEM 8
BUN: 26 mg/dL — ABNORMAL HIGH (ref 6–20)
Calcium, Ion: 1.08 mmol/L — ABNORMAL LOW (ref 1.15–1.40)
Chloride: 94 mmol/L — ABNORMAL LOW (ref 98–111)
Creatinine, Ser: 1 mg/dL (ref 0.61–1.24)
Glucose, Bld: 133 mg/dL — ABNORMAL HIGH (ref 70–99)
HCT: 39 % (ref 39.0–52.0)
Hemoglobin: 13.3 g/dL (ref 13.0–17.0)
Potassium: 3.3 mmol/L — ABNORMAL LOW (ref 3.5–5.1)
Sodium: 142 mmol/L (ref 135–145)
TCO2: 33 mmol/L — ABNORMAL HIGH (ref 22–32)

## 2019-03-02 LAB — PROTIME-INR
INR: 1.4 — ABNORMAL HIGH (ref 0.8–1.2)
INR: 1.7 — ABNORMAL HIGH (ref 0.8–1.2)
Prothrombin Time: 16.8 seconds — ABNORMAL HIGH (ref 11.4–15.2)
Prothrombin Time: 19.8 seconds — ABNORMAL HIGH (ref 11.4–15.2)

## 2019-03-02 LAB — TROPONIN I (HIGH SENSITIVITY)
Troponin I (High Sensitivity): 22 ng/L — ABNORMAL HIGH (ref <18)
Troponin I (High Sensitivity): 78 ng/L — ABNORMAL HIGH (ref <18)
Troponin I (High Sensitivity): 221 ng/L (ref <18)

## 2019-03-02 LAB — URINALYSIS, ROUTINE W REFLEX MICROSCOPIC
Bilirubin Urine: NEGATIVE
Glucose, UA: NEGATIVE mg/dL
Hgb urine dipstick: NEGATIVE
Ketones, ur: NEGATIVE mg/dL
Leukocytes,Ua: NEGATIVE
Nitrite: NEGATIVE
Protein, ur: NEGATIVE mg/dL
Specific Gravity, Urine: 1.013 (ref 1.005–1.030)
pH: 9 — ABNORMAL HIGH (ref 5.0–8.0)

## 2019-03-02 LAB — ECHOCARDIOGRAM COMPLETE
Height: 72 in
Weight: 4317.49 oz

## 2019-03-02 LAB — LACTIC ACID, PLASMA
Lactic Acid, Venous: 2.3 mmol/L (ref 0.5–1.9)
Lactic Acid, Venous: 6.2 mmol/L (ref 0.5–1.9)

## 2019-03-02 LAB — MAGNESIUM: Magnesium: 1.9 mg/dL (ref 1.7–2.4)

## 2019-03-02 LAB — POCT I-STAT 4, (NA,K, GLUC, HGB,HCT)
Glucose, Bld: 129 mg/dL — ABNORMAL HIGH (ref 70–99)
HCT: 39 % (ref 39.0–52.0)
Hemoglobin: 13.3 g/dL (ref 13.0–17.0)
Potassium: 2.6 mmol/L — CL (ref 3.5–5.1)
Sodium: 142 mmol/L (ref 135–145)

## 2019-03-02 LAB — GLUCOSE, CAPILLARY
Glucose-Capillary: 109 mg/dL — ABNORMAL HIGH (ref 70–99)
Glucose-Capillary: 116 mg/dL — ABNORMAL HIGH (ref 70–99)
Glucose-Capillary: 123 mg/dL — ABNORMAL HIGH (ref 70–99)
Glucose-Capillary: 127 mg/dL — ABNORMAL HIGH (ref 70–99)
Glucose-Capillary: 134 mg/dL — ABNORMAL HIGH (ref 70–99)
Glucose-Capillary: 237 mg/dL — ABNORMAL HIGH (ref 70–99)
Glucose-Capillary: 53 mg/dL — ABNORMAL LOW (ref 70–99)
Glucose-Capillary: 82 mg/dL (ref 70–99)
Glucose-Capillary: 96 mg/dL (ref 70–99)

## 2019-03-02 LAB — CBC
HCT: 29.8 % — ABNORMAL LOW (ref 39.0–52.0)
Hemoglobin: 8.1 g/dL — ABNORMAL LOW (ref 13.0–17.0)
MCH: 21 pg — ABNORMAL LOW (ref 26.0–34.0)
MCHC: 27.2 g/dL — ABNORMAL LOW (ref 30.0–36.0)
MCV: 77.2 fL — ABNORMAL LOW (ref 80.0–100.0)
Platelets: 238 10*3/uL (ref 150–400)
RBC: 3.86 MIL/uL — ABNORMAL LOW (ref 4.22–5.81)
RDW: 22 % — ABNORMAL HIGH (ref 11.5–15.5)
WBC: 15.5 10*3/uL — ABNORMAL HIGH (ref 4.0–10.5)
nRBC: 0.5 % — ABNORMAL HIGH (ref 0.0–0.2)

## 2019-03-02 LAB — BRAIN NATRIURETIC PEPTIDE: B Natriuretic Peptide: 722.8 pg/mL — ABNORMAL HIGH (ref 0.0–100.0)

## 2019-03-02 LAB — PHOSPHORUS: Phosphorus: 3.4 mg/dL (ref 2.5–4.6)

## 2019-03-02 MED ORDER — NOREPINEPHRINE 4 MG/250ML-% IV SOLN
0.0000 ug/min | INTRAVENOUS | Status: DC
  Administered 2019-03-02: 1 ug/min via INTRAVENOUS
  Filled 2019-03-02: qty 250

## 2019-03-02 MED ORDER — NALOXONE HCL 2 MG/2ML IJ SOSY
1.0000 mg | PREFILLED_SYRINGE | Freq: Once | INTRAMUSCULAR | Status: DC
  Filled 2019-03-02: qty 2

## 2019-03-02 MED ORDER — POTASSIUM CHLORIDE 20 MEQ/15ML (10%) PO SOLN: 40.0000 meq | Freq: Every day | ORAL | Status: DC

## 2019-03-02 MED ORDER — ORAL CARE MOUTH RINSE
15.0000 mL | OROMUCOSAL | Status: DC
  Administered 2019-03-02 – 2019-03-09 (×71): 15 mL via OROMUCOSAL

## 2019-03-02 MED ORDER — FENTANYL CITRATE (PF) 100 MCG/2ML IJ SOLN
50.0000 ug | Freq: Once | INTRAMUSCULAR | Status: AC
  Administered 2019-03-02: 03:00:00 50 ug via INTRAVENOUS

## 2019-03-02 MED ORDER — CHLORHEXIDINE GLUCONATE CLOTH 2 % EX PADS
6.0000 | MEDICATED_PAD | Freq: Every day | CUTANEOUS | Status: DC
  Administered 2019-03-02 – 2019-03-09 (×7): 6 via TOPICAL

## 2019-03-02 MED ORDER — POTASSIUM CHLORIDE 10 MEQ/50ML IV SOLN
10.0000 meq | INTRAVENOUS | Status: AC
  Administered 2019-03-02 – 2019-03-03 (×4): 10 meq via INTRAVENOUS
  Filled 2019-03-02 (×4): qty 50

## 2019-03-02 MED ORDER — MIDAZOLAM BOLUS VIA INFUSION
2.0000 mg | INTRAVENOUS | Status: DC | PRN
  Administered 2019-03-04 – 2019-03-09 (×16): 2 mg via INTRAVENOUS
  Filled 2019-03-02: qty 2

## 2019-03-02 MED ORDER — "THROMBI-PAD 3""X3"" EX PADS"
1.0000 | MEDICATED_PAD | Freq: Once | CUTANEOUS | Status: AC
  Administered 2019-03-02: 13:00:00 1 via TOPICAL
  Filled 2019-03-02: qty 1

## 2019-03-02 MED ORDER — POTASSIUM CHLORIDE 10 MEQ/50ML IV SOLN
10.0000 meq | INTRAVENOUS | Status: AC
  Administered 2019-03-02 (×2): 10 meq via INTRAVENOUS
  Filled 2019-03-02 (×2): qty 50

## 2019-03-02 MED ORDER — SODIUM CHLORIDE 0.9 % IV SOLN
0.5000 ug/kg/min | INTRAVENOUS | Status: DC
  Administered 2019-03-02: 03:00:00 0.5 ug/kg/min via INTRAVENOUS
  Administered 2019-03-02: 1.5 ug/kg/min via INTRAVENOUS
  Filled 2019-03-02 (×3): qty 20

## 2019-03-02 MED ORDER — METOPROLOL TARTRATE 5 MG/5ML IV SOLN
2.5000 mg | Freq: Four times a day (QID) | INTRAVENOUS | Status: DC
  Administered 2019-03-02 – 2019-03-04 (×6): 2.5 mg via INTRAVENOUS
  Filled 2019-03-02 (×5): qty 5

## 2019-03-02 MED ORDER — INSULIN ASPART 100 UNIT/ML ~~LOC~~ SOLN
0.0000 [IU] | SUBCUTANEOUS | Status: DC
  Administered 2019-03-02 – 2019-03-04 (×8): 2 [IU] via SUBCUTANEOUS
  Administered 2019-03-05: 3 [IU] via SUBCUTANEOUS
  Administered 2019-03-06 (×6): 2 [IU] via SUBCUTANEOUS
  Administered 2019-03-07: 3 [IU] via SUBCUTANEOUS
  Administered 2019-03-07 – 2019-03-08 (×6): 2 [IU] via SUBCUTANEOUS
  Administered 2019-03-08 (×2): 3 [IU] via SUBCUTANEOUS
  Administered 2019-03-09 (×2): 2 [IU] via SUBCUTANEOUS
  Administered 2019-03-09: 3 [IU] via SUBCUTANEOUS

## 2019-03-02 MED ORDER — HEPARIN SODIUM (PORCINE) 5000 UNIT/ML IJ SOLN
5000.0000 [IU] | Freq: Three times a day (TID) | INTRAMUSCULAR | Status: DC
  Administered 2019-03-02 – 2019-03-09 (×21): 5000 [IU] via SUBCUTANEOUS
  Filled 2019-03-02 (×21): qty 1

## 2019-03-02 MED ORDER — CHLORHEXIDINE GLUCONATE 0.12% ORAL RINSE (MEDLINE KIT)
15.0000 mL | Freq: Two times a day (BID) | OROMUCOSAL | Status: DC
  Administered 2019-03-02 – 2019-03-09 (×15): 15 mL via OROMUCOSAL

## 2019-03-02 MED ORDER — CISATRACURIUM BESYLATE (PF) 10 MG/5ML IV SOLN
10.0000 mg | INTRAVENOUS | Status: DC | PRN
  Filled 2019-03-02: qty 5

## 2019-03-02 MED ORDER — FENTANYL CITRATE (PF) 100 MCG/2ML IJ SOLN
100.0000 ug | Freq: Once | INTRAMUSCULAR | Status: DC
  Administered 2019-03-02: 03:00:00 100 ug via INTRAVENOUS

## 2019-03-02 MED ORDER — SODIUM CHLORIDE 0.9 % IV SOLN
INTRAVENOUS | Status: DC | PRN
  Administered 2019-03-02: 250 mL via INTRAVENOUS

## 2019-03-02 MED ORDER — FUROSEMIDE 10 MG/ML IJ SOLN
60.0000 mg | Freq: Three times a day (TID) | INTRAMUSCULAR | Status: DC
  Administered 2019-03-02: 60 mg via INTRAVENOUS
  Filled 2019-03-02: qty 6

## 2019-03-02 MED ORDER — CISATRACURIUM BESYLATE (PF) 10 MG/5ML IV SOLN
0.1000 mg/kg | INTRAVENOUS | Status: DC | PRN
  Filled 2019-03-02: qty 6.1

## 2019-03-02 MED ORDER — FENTANYL BOLUS VIA INFUSION
50.0000 ug | INTRAVENOUS | Status: DC | PRN
  Filled 2019-03-02: qty 50

## 2019-03-02 MED ORDER — FENTANYL 2500MCG IN NS 250ML (10MCG/ML) PREMIX INFUSION
0.0000 ug/h | INTRAVENOUS | Status: DC
  Administered 2019-03-02 (×2): 350 ug/h via INTRAVENOUS
  Administered 2019-03-02: 200 ug/h via INTRAVENOUS
  Administered 2019-03-03 (×3): 300 ug/h via INTRAVENOUS
  Administered 2019-03-04: 175 ug/h via INTRAVENOUS
  Filled 2019-03-02 (×6): qty 250

## 2019-03-02 MED ORDER — SODIUM CHLORIDE 0.9 % IV SOLN
250.0000 mL | INTRAVENOUS | Status: DC
  Administered 2019-03-04 – 2019-03-08 (×2): 250 mL via INTRAVENOUS

## 2019-03-02 MED ORDER — HYDRALAZINE HCL 20 MG/ML IJ SOLN
10.0000 mg | INTRAMUSCULAR | Status: AC | PRN
  Administered 2019-03-02 – 2019-03-05 (×3): 20 mg via INTRAVENOUS
  Administered 2019-03-05: 19:00:00 10 mg via INTRAVENOUS
  Administered 2019-03-06: 40 mg via INTRAVENOUS
  Filled 2019-03-02: qty 2
  Filled 2019-03-02: qty 1
  Filled 2019-03-02: qty 2
  Filled 2019-03-02 (×2): qty 1

## 2019-03-02 MED ORDER — ASPIRIN 81 MG PO CHEW
81.0000 mg | CHEWABLE_TABLET | Freq: Every day | ORAL | Status: DC
  Administered 2019-03-02 – 2019-03-09 (×8): 81 mg
  Filled 2019-03-02 (×8): qty 1

## 2019-03-02 MED ORDER — SODIUM CHLORIDE 0.9% FLUSH
10.0000 mL | Freq: Two times a day (BID) | INTRAVENOUS | Status: DC
  Administered 2019-03-04 – 2019-03-09 (×7): 10 mL

## 2019-03-02 MED ORDER — CALCIUM GLUCONATE-NACL 1-0.675 GM/50ML-% IV SOLN
1.0000 g | Freq: Once | INTRAVENOUS | Status: AC
  Administered 2019-03-02: 09:00:00 1000 mg via INTRAVENOUS
  Filled 2019-03-02: qty 50

## 2019-03-02 MED ORDER — PIPERACILLIN-TAZOBACTAM 3.375 G IVPB
3.3750 g | Freq: Three times a day (TID) | INTRAVENOUS | Status: DC
  Administered 2019-03-02 – 2019-03-05 (×10): 3.375 g via INTRAVENOUS
  Filled 2019-03-02 (×9): qty 50

## 2019-03-02 MED ORDER — ASPIRIN 300 MG RE SUPP
300.0000 mg | RECTAL | Status: AC
  Administered 2019-03-02: 300 mg via RECTAL
  Filled 2019-03-02: qty 1

## 2019-03-02 MED ORDER — INSULIN GLARGINE 100 UNIT/ML ~~LOC~~ SOLN
15.0000 [IU] | Freq: Every day | SUBCUTANEOUS | Status: DC
  Filled 2019-03-02 (×2): qty 0.15

## 2019-03-02 MED ORDER — DEXTROSE 50 % IV SOLN
25.0000 mL | Freq: Once | INTRAVENOUS | Status: AC
  Administered 2019-03-02: 04:00:00 25 mL via INTRAVENOUS
  Filled 2019-03-02: qty 50

## 2019-03-02 MED ORDER — SODIUM CHLORIDE 0.9% FLUSH: 10.0000 mL | INTRAVENOUS | Status: DC | PRN

## 2019-03-02 MED ORDER — ARIPIPRAZOLE 10 MG PO TABS
10.0000 mg | ORAL_TABLET | Freq: Every day | ORAL | Status: DC
  Administered 2019-03-02: 10 mg
  Filled 2019-03-02 (×2): qty 1

## 2019-03-02 MED ORDER — METOLAZONE 5 MG PO TABS: 5.0000 mg | ORAL_TABLET | Freq: Every day | ORAL | Status: DC

## 2019-03-02 MED ORDER — VANCOMYCIN HCL 10 G IV SOLR
2000.0000 mg | Freq: Once | INTRAVENOUS | Status: AC
  Administered 2019-03-02: 2000 mg via INTRAVENOUS
  Filled 2019-03-02: qty 2000

## 2019-03-02 MED ORDER — VANCOMYCIN HCL 10 G IV SOLR
1250.0000 mg | Freq: Two times a day (BID) | INTRAVENOUS | Status: DC
  Administered 2019-03-02 – 2019-03-04 (×4): 1250 mg via INTRAVENOUS
  Filled 2019-03-02 (×6): qty 1250

## 2019-03-02 MED ORDER — ARTIFICIAL TEARS OPHTHALMIC OINT
1.0000 "application " | TOPICAL_OINTMENT | Freq: Three times a day (TID) | OPHTHALMIC | Status: DC
  Administered 2019-03-02 – 2019-03-08 (×18): 1 via OPHTHALMIC
  Filled 2019-03-02 (×2): qty 3.5

## 2019-03-02 MED ORDER — "THROMBI-PAD 3""X3"" EX PADS"
1.0000 | MEDICATED_PAD | Freq: Once | CUTANEOUS | Status: AC
  Administered 2019-03-02: 22:00:00 1 via TOPICAL
  Filled 2019-03-02: qty 1

## 2019-03-02 MED ORDER — PANTOPRAZOLE SODIUM 40 MG IV SOLR
40.0000 mg | Freq: Every day | INTRAVENOUS | Status: DC
  Administered 2019-03-02 – 2019-03-09 (×8): 40 mg via INTRAVENOUS
  Filled 2019-03-02 (×8): qty 40

## 2019-03-02 MED ORDER — SODIUM CHLORIDE 0.9 % IV SOLN
INTRAVENOUS | Status: DC
  Administered 2019-03-02: 03:00:00 via INTRAVENOUS

## 2019-03-02 MED ORDER — MIDAZOLAM 50MG/50ML (1MG/ML) PREMIX INFUSION
2.0000 mg/h | INTRAVENOUS | Status: DC
  Administered 2019-03-02 (×2): 10 mg/h via INTRAVENOUS
  Administered 2019-03-02: 02:00:00 2 mg/h via INTRAVENOUS
  Administered 2019-03-02 – 2019-03-07 (×25): 10 mg/h via INTRAVENOUS
  Administered 2019-03-07: 7 mg/h via INTRAVENOUS
  Administered 2019-03-07: 10 mg/h via INTRAVENOUS
  Administered 2019-03-07: 5 mg/h via INTRAVENOUS
  Administered 2019-03-08 – 2019-03-09 (×7): 10 mg/h via INTRAVENOUS
  Filled 2019-03-02 (×14): qty 50
  Filled 2019-03-02: qty 100
  Filled 2019-03-02 (×23): qty 50

## 2019-03-02 MED ORDER — IOHEXOL 350 MG/ML SOLN
75.0000 mL | Freq: Once | INTRAVENOUS | Status: AC | PRN
  Administered 2019-03-02: 75 mL via INTRAVENOUS

## 2019-03-02 MED ORDER — SIMETHICONE 80 MG PO CHEW
80.0000 mg | CHEWABLE_TABLET | Freq: Four times a day (QID) | ORAL | Status: DC | PRN
  Filled 2019-03-02: qty 1

## 2019-03-02 MED ORDER — CARBAMAZEPINE 100 MG PO CHEW
100.0000 mg | CHEWABLE_TABLET | Freq: Two times a day (BID) | ORAL | Status: DC
  Administered 2019-03-02 – 2019-03-09 (×14): 100 mg
  Filled 2019-03-02 (×18): qty 1

## 2019-03-02 MED ORDER — HYDRALAZINE HCL 20 MG/ML IJ SOLN
10.0000 mg | INTRAMUSCULAR | Status: DC | PRN
  Administered 2019-03-02: 10 mg via INTRAVENOUS
  Filled 2019-03-02: qty 1

## 2019-03-02 MED ORDER — FENTANYL 2500MCG IN NS 250ML (10MCG/ML) PREMIX INFUSION
100.0000 ug/h | INTRAVENOUS | Status: DC
  Administered 2019-03-02: 200 ug/h via INTRAVENOUS
  Filled 2019-03-02: qty 250

## 2019-03-02 MED ORDER — POTASSIUM CHLORIDE 10 MEQ/50ML IV SOLN
10.0000 meq | INTRAVENOUS | Status: AC
  Administered 2019-03-02 (×5): 10 meq via INTRAVENOUS
  Filled 2019-03-02 (×6): qty 50

## 2019-03-02 MED ORDER — MIDAZOLAM HCL 2 MG/2ML IJ SOLN
2.0000 mg | Freq: Once | INTRAMUSCULAR | Status: AC
  Administered 2019-03-02: 03:00:00 2 mg via INTRAVENOUS

## 2019-03-02 MED FILL — Medication: Qty: 1 | Status: AC

## 2019-03-02 NOTE — Progress Notes (Signed)
CRITICAL VALUE ALERT  Critical Value:  K+ 2.6  Date & Time Notied:  03/02/19 1345  Provider Notified: Angeline Slim NP  Orders Received/Actions taken: K+ IV

## 2019-03-02 NOTE — Consult Note (Addendum)
NAME:  [name] MRN:  [id number] DOB:  1960-07-28 LOS: 4 ADMISSION DATE:  02/09/2019  CONSULTATION DATE:  03/02/2019 REFERRING MD: Dr. Delrae SawyersMauricio [name]  REASON FOR CONSULTATION: Post CPR resuscitation; acute hypoxemic respiratory failure  Initial Pulmonary/Critical Care Consultation  Brief History   N/A  History of present illness   This 58 y.o. African-American male smoker is seen in consultation at the request of Dr. Delrae SawyersMauricio [name] for recommendations on further evaluation and management of post CPR resuscitation and acute hypoxemic respiratory failure.  The patient transferred to 2M04 after undergoing CPR for asystole.  ROSC was achieved 14 minutes per discussion with Dr. Cleaster [name] and CODE BLUE documentation in the EMR.  Review of nursing notes suggest that the patient became unresponsive while receiving nursing care.  Rapid response team was called, immediately followed by the CODE BLUE.   Patient initially presented on 8/26 after a fall, believed to be a mechanical fall subsequent to cocaine use.  He has a known history of noncompliance with prescribed medications, multiple hospitalizations and documented history of leaving AGAINST MEDICAL ADVICE.  He was somnolent on presentation and unable to give a detailed history.  He was admitted with a clinical impression of acute on chronic systolic heart failure exacerbation.     REVIEW OF SYSTEMS This patient is critically ill and cannot provide additional history nor review of systems due to mental status/unconsciousness and endotracheally intubated.  Constitutional: No weight loss. No night sweats. No fever. No chills. No fatigue. HEENT: No headaches, dysphagia, sore throat, otalgia, nasal congestion, PND CV:  No chest pain, orthopnea, PND, swelling in lower extremities, palpitations GI:  No abdominal pain, nausea, vomiting, diarrhea, change in bowel pattern, anorexia Resp: No DOE, rest dyspnea, cough, mucus, hemoptysis, wheezing    GU: no dysuria, change in color of urine, no urgency or frequency.  No flank pain. MS:  No joint pain or swelling. No myalgias,  No decreased range of motion.  Psych:  No change in mood or affect. No memory loss. Skin: no rash or lesions.   Past Medical/Surgical/Social/Family History   Past Medical History:  Diagnosis Date   Chronic foot pain    Cocaine abuse (HCC)    Diabetes mellitus without complication (HCC)    Hepatitis C    unsure    Homelessness    Hypertension    Neuropathy    Polysubstance abuse (HCC)    Schizophrenia (HCC)    Sleep apnea    Systolic and diastolic CHF, chronic (HCC)     Past Surgical History:  Procedure Laterality Date   MULTIPLE TOOTH EXTRACTIONS      Social History   Tobacco Use   Smoking status: Current Every Day Smoker    Packs/day: 1.00    Years: 20.00    Pack years: 20.00    Types: Cigarettes   Smokeless tobacco: Current User  Substance Use Topics   Alcohol use: Yes    Comment: Daily Drinker     Family History  Problem Relation Age of Onset   Hypertension Other    Diabetes Other      Significant Hospital Events   9/1: CODE BLUE/asystole.  ROSC after 14 minutes.  Transferred to 2M04. 8/26: Presented to ER after mechanical fall subsequent to cocaine use.  Admitted with a clinical impression of acute on chronic systolic heart failure exacerbation, pulmonary edema.   Consults:  PCCM (9/1)   Procedures:  N/A   Significant Diagnostic Tests:  CT head (9/1): No acute  intracranial abnormality  2D echo (11/07/2018): LVEF 25-30%, mildly dilated, moderate concentric LVH.  Diffuse LV hypokinesis.  Diastolic dysfunction.  RV volume and pressure overload.  LA moderately dilated.  RA severely dilated.  Small pericardial effusion.   Micro Data:   Results for orders placed or performed during the hospital encounter of 11-09-18  SARS CORONAVIRUS 2 (TAT 6-12 HRS) Nasal Swab Aptima Multi Swab     Status: None    Collection Time: 02/26/19  8:52 AM   Specimen: Aptima Multi Swab; Nasal Swab  Result Value Ref Range Status   SARS Coronavirus 2 NEGATIVE NEGATIVE Final    Comment: (NOTE) SARS-CoV-2 target nucleic acids are NOT DETECTED. The SARS-CoV-2 RNA is generally detectable in upper and lower respiratory specimens during the acute phase of infection. Negative results do not preclude SARS-CoV-2 infection, do not rule out co-infections with other pathogens, and should not be used as the sole basis for treatment or other patient management decisions. Negative results must be combined with clinical observations, patient history, and epidemiological information. The expected result is Negative. Fact Sheet for Patients: HairSlick.nohttps://www.fda.gov/media/138098/download Fact Sheet for Healthcare Providers: quierodirigir.comhttps://www.fda.gov/media/138095/download This test is not yet approved or cleared by the [place] FDA and  has been authorized for detection and/or diagnosis of SARS-CoV-2 by FDA under an Emergency Use Authorization (EUA). This EUA will remain  in effect (meaning this test can be used) for the duration of the COVID-19 declaration under Section 56 4(b)(1) of the Act, 21 U.S.C. section 360bbb-3(b)(1), unless the authorization is terminated or revoked sooner. Performed at [facility], [address] [place], [place] [address]       Antimicrobials:  N/A   Interim history/subjective:  N/A   Objective   BP (!) 142/92    Pulse (!) 118    Temp 98.7 F (37.1 C) (Oral)    Resp 19    Ht 6' (1.829 m)    Wt 122.4 kg    SpO2 99%    BMI 36.60 kg/m     Filed Weights   02/28/19 0416 03/01/19 0500 03/02/19 0001  Weight: 122 kg 117.5 kg 122.4 kg    Intake/Output Summary (Last 24 hours) at 03/02/2019 0121 Last data filed at 03/01/2019 2200 Gross per 24 hour  Intake 1607 ml  Output 826 ml  Net 781 ml    Vent Mode: PRVC FiO2 (%):  [80 %-100 %] 80 % Set Rate:  [20 bmp-26 bmp] 26 bmp Vt Set:   [478[620 mL] 620 mL PEEP:  [8 cmH20] 8 cmH20 Plateau Pressure:  [26 cmH20] 26 cmH20   Examination: GENERAL: Intubated. Comatose.  Winces to noxious stimuli. No acute distress. HEAD: normocephalic, atraumatic EYE: Scleral edema.  Muddy sclera.  No pallor.  Conjunctival injection bilaterally.  No anisocoria. THROAT/ORAL CAVITY: Normal dentition. No oral thrush. No exudate. Mucous membranes are moist. No tonsillar enlargement. ETT in situ. NECK: supple, no thyromegaly, no JVD, no lymphadenopathy. Trachea midline. CHEST/LUNG: symmetric in development and expansion. Good air entry. No crackles. No wheezes. HEART: Tachycardia.  Regular S1 and S2 without murmur, rub or gallop. ABDOMEN: soft, nontender, nondistended. Normoactive bowel sounds. No rebound. No guarding. No hepatosplenomegaly. EXTREMITIES: Edema: Trace. No cyanosis.  No clubbing. 1+ DP pulses LYMPHATIC: no cervical/axillary/inguinal lymph nodes appreciated MUSCULOSKELETAL: No point tenderness.  No bulk atrophy. Joints: Normal inspection.  NEUROLOGIC: Doll's eyes intact. Corneal reflex intact. Spontaneous respirations intact.  Facial symmetry. Babinski absent. No sensory deficit.  Moves all 4 extremities on noxious stimuli.  DTR: 2+ @ R biceps, 2+ @ L biceps, 2+ @ R patellar,  2+ @ L patellar.   Resolved Hospital Problem list   N/A   Assessment & Plan:   ASSESSMENT/PLAN:  ASSESSMENT (included in the Hospital Problem List)  Principal Problem:   Acute on chronic respiratory failure with hypoxia (HCC) Active Problems:   Acute on chronic combined systolic (congestive) and diastolic (congestive) heart failure (HCC)   Acute pulmonary edema (HCC)   Shock (HCC)   Anoxic encephalopathy (HCC)   Cocaine abuse with cocaine-induced mood disorder (HCC)   Abnormal EKG   Essential hypertension   Tobacco use   HLD (hyperlipidemia)   Diabetes mellitus without complication (HCC)   By systems: PULMONARY  Acute hypoxemic respiratory  failure  Acute pulmonary edema Titrate vent settings based on ABG results. Initial PEEP 8.   CARDIOVASCULAR  Shock, unknown etiology, likely cardiogenic after cardiac arrrest/asystole  Abnormal EKG, chronic (T wave inversions in I, aVL, V5, V6)  Acute on chronic systolic heart failure, biventricular failure  Essential hypertension at baseline. Check SvO2 Trend troponin.  Check BNP.   RENAL  No acute issues   GASTROINTESTINAL  GI PROPHYLAXIS: Pepcid   HEMATOLOGIC  Leukocytosis  DVT PROPHYLAXIS: Protonix Empiric antibiotic therapy to include aspiration coverage (Zosyn/vancomycin).   INFECTIOUS  COVID-19 ruled out by lab testing (8/28)  No acute issues In light of new leukocytosis, will empirically cover for aspiration.   ENDOCRINE  Type 2 diabetes mellitus   NEUROLOGIC  Anoxic encephalopathy  Cocaine abuse/intoxication Targeted temperature management (33 degree C)   PLAN/RECOMMENDATIONS   Transfer to 2H for targeted temperature management.    My assessment, plan of care, findings, medications, side effects, etc. were discussed with: nurse and respiratory therapist.   Best practice:  Diet: N.p.o. Pain/Anxiety/Delirium protocol (if indicated): Yes VAP protocol (if indicated): Yes DVT prophylaxis: Heparin GI prophylaxis: Pepcid Glucose control: Sliding scale insulin Mobility/Activity: Bedrest CODE STATUS: Full code Family Communication:  No family at the bedside.  This patient is reported to be homeless.  Social work consultation will be requested. Disposition: Transfer to 2H for targeted temperature management   Labs   CBC: Recent Labs  Lab 02/26/19 0527 02/26/19 2341 03/01/19 2327 03/01/19 2351 03/02/19 0103  WBC 7.8 6.6 12.1*   12.1*  --   --   NEUTROABS 5.9 4.5 8.2*  --   --   HGB 8.8* 8.2* 9.0*   8.9* 11.2* 10.5*  HCT 31.7* 29.5* 33.2*   33.7* 33.0* 31.0*  MCV 75.7* 75.4* 78.7*   78.7*  --   --   PLT 280 245 239   236  --   --      Basic Metabolic Panel: Recent Labs  Lab 02/26/19 0527 02/27/19 0435 02/28/19 0405 03/01/19 0551 03/01/19 2327 03/01/19 2351 03/02/19 0103  NA 141 141 141 139 138 139 141  K 3.9 3.4* 3.9 3.9 6.2* 3.7 3.7  CL 100 98 98 96* 93*  --   --   CO2 30 34* 33* 34* 33*  --   --   GLUCOSE 181* 187* 195* 209* 80  --   --   BUN 11 15 18 18 19   --   --   CREATININE 0.95 1.13 0.94 1.04 0.99  --   --   CALCIUM 8.7* 8.5* 8.7* 8.7* 9.1  --   --   MG  --   --   --  2.0  --   --   --  GFR: Estimated Creatinine Clearance: 109.9 mL/min (by C-G formula based on SCr of 0.99 mg/dL). Recent Labs  Lab 02/26/19 0527 02/26/19 2341 03/01/19 2327 03/01/19 2355  WBC 7.8 6.6 12.1*   12.1*  --   LATICACIDVEN  --   --   --  6.2*    Liver Function Tests: No results for input(s): AST, ALT, ALKPHOS, BILITOT, PROT, ALBUMIN in the last 168 hours. No results for input(s): LIPASE, AMYLASE in the last 168 hours. No results for input(s): AMMONIA in the last 168 hours.  ABG    Component Value Date/Time   PHART 7.407 03/02/2019 0103   PCO2ART 56.4 (H) 03/02/2019 0103   PO2ART 65.0 (L) 03/02/2019 0103   HCO3 35.4 (H) 03/02/2019 0103   TCO2 37 (H) 03/02/2019 0103   O2SAT 92.0 03/02/2019 0103     Coagulation Profile: No results for input(s): INR, PROTIME in the last 168 hours.  Cardiac Enzymes: No results for input(s): CKTOTAL, CKMB, CKMBINDEX, TROPONINI in the last 168 hours.  HbA1C: Hgb A1c MFr Bld  Date/Time Value Ref Range Status  12/04/2018 01:48 PM 9.7 (H) 4.8 - 5.6 % Final    Comment:    (NOTE)         Prediabetes: 5.7 - 6.4         Diabetes: >6.4         Glycemic control for adults with diabetes: <7.0   12/02/2018 09:07 AM 9.4 (H) 4.8 - 5.6 % Final    Comment:    (NOTE) Pre diabetes:          5.7%-6.4% Diabetes:              >6.4% Glycemic control for   <7.0% adults with diabetes     CBG: Recent Labs  Lab 03/01/19 1210 03/01/19 1650 03/01/19 2119 03/01/19 2310  03/01/19 2347  GLUCAP 203* 200* 145* 78 237*     Review of Systems:   See above   Past Medical History   Past Medical History:  Diagnosis Date   Chronic foot pain    Cocaine abuse (HCC)    Diabetes mellitus without complication (HCC)    Hepatitis C    unsure    Homelessness    Hypertension    Neuropathy    Polysubstance abuse (HCC)    Schizophrenia (HCC)    Sleep apnea    Systolic and diastolic CHF, chronic (HCC)       Surgical History    Past Surgical History:  Procedure Laterality Date   MULTIPLE TOOTH EXTRACTIONS        Social History   Social History   Socioeconomic History   Marital status: Single    Spouse name: Not on file   Number of children: Not on file   Years of education: Not on file   Highest education level: Not on file  Occupational History   Not on file  Social Needs   Financial resource strain: Not on file   Food insecurity    Worry: Not on file    Inability: Not on file   Transportation needs    Medical: Not on file    Non-medical: Not on file  Tobacco Use   Smoking status: Current Every Day Smoker    Packs/day: 1.00    Years: 20.00    Pack years: 20.00    Types: Cigarettes   Smokeless tobacco: Current User  Substance and Sexual Activity   Alcohol use: Yes    Comment: Daily Drinker  Drug use: Yes    Frequency: 7.0 times per week    Types: "Crack" cocaine, Cocaine, Marijuana    Comment: Crack   Sexual activity: Not Currently  Lifestyle   Physical activity    Days per week: Not on file    Minutes per session: Not on file   Stress: Not on file  Relationships   Social connections    Talks on phone: Not on file    Gets together: Not on file    Attends religious service: Not on file    Active member of club or organization: Not on file    Attends meetings of clubs or organizations: Not on file    Relationship status: Not on file  Other Topics Concern   Not on file  Social History  Narrative   ** Merged History Encounter **          Family History    Family History  Problem Relation Age of Onset   Hypertension Other    Diabetes Other    family history includes Diabetes in an other family member; Hypertension in an other family member.    Allergies Allergies  Allergen Reactions   Haldol [Haloperidol] Other (See Comments)    Muscle spasms, loss of voluntary movement. However, pt has taken Thorazine on multiple occasions with no adverse effects.       Current Medications  Current Facility-Administered Medications:    0.9 %  sodium chloride infusion, 250 mL, Intravenous, PRN, Jonah Blue, MD   0.9 %  sodium chloride infusion, 250 mL, Intravenous, Continuous, Seawell, Jaimie A, DO   0.9 %  sodium chloride infusion, , Intravenous, Continuous, Marcelle Smiling, MD   acetaminophen (TYLENOL) tablet 650 mg, 650 mg, Oral, Q4H PRN, Jonah Blue, MD, 650 mg at 03/01/19 1048   ARIPiprazole (ABILIFY) tablet 10 mg, 10 mg, Per Tube, Daily, Marcelle Smiling, MD   artificial tears (LACRILUBE) ophthalmic ointment 1 application, 1 application, Both Eyes, Q8H, Marcelle Smiling, MD   aspirin EC tablet 81 mg, 81 mg, Oral, Daily, Jonah Blue, MD, 81 mg at 03/01/19 1051   aspirin suppository 300 mg, 300 mg, Rectal, NOW, Marcelle Smiling, MD   carbamazepine (TEGRETOL) tablet 100 mg, 100 mg, Oral, BID, Jonah Blue, MD, 100 mg at 03/01/19 2218   Chlorhexidine Gluconate Cloth 2 % PADS 6 each, 6 each, Topical, Q0600, [name], York Ram, MD   enoxaparin (LOVENOX) injection 40 mg, 40 mg, Subcutaneous, Q24H, Jonah Blue, MD, 40 mg at 03/01/19 1320   fentaNYL (SUBLIMAZE) bolus via infusion 50 mcg, 50 mcg, Intravenous, Q30 min PRN, Marcelle Smiling, MD   fentaNYL (SUBLIMAZE) injection 100 mcg, 100 mcg, Intravenous, Once, Marcelle Smiling, MD   fentaNYL in NS (42mcg/ml) infusion-PREMIX, 100-300 mcg/hr, Intravenous, Continuous, Marcelle Smiling, MD   furosemide (LASIX) injection 60 mg, 60 mg, Intravenous, Q12H, [name], York Ram, MD, 60 mg at 03/01/19 1732   heparin injection 5,000 Units, 5,000 Units, Subcutaneous, Q8H, Marcelle Smiling, MD   insulin aspart (novoLOG) injection 0-15 Units, 0-15 Units, Subcutaneous, TID WC, Jonah Blue, MD, 3 Units at 03/01/19 1729   insulin aspart (novoLOG) injection 0-5 Units, 0-5 Units, Subcutaneous, QHS, Jonah Blue, MD, 2 Units at 02/28/19 2303   insulin aspart (novoLOG) injection 3 Units, 3 Units, Subcutaneous, TID WC, Jonah Blue, MD, 3 Units at 03/01/19 1729   insulin glargine (LANTUS) injection 30 Units, 30 Units, Subcutaneous, Daily, [name], York Ram, MD, 30 Units at 03/01/19 1055   lisinopril (ZESTRIL) tablet 5 mg,  5 mg, Oral, Daily, Karmen Bongo, MD, 5 mg at 03/01/19 1051   metolazone (ZAROXOLYN) tablet 5 mg, 5 mg, Oral, Daily, Renee Pain, MD   midazolam (VERSED) 50 mg/50 mL (1 mg/mL) premix infusion, 2-10 mg/hr, Intravenous, Continuous, Renee Pain, MD   midazolam (VERSED) bolus via infusion 2 mg, 2 mg, Intravenous, Q30 min PRN, Renee Pain, MD   midazolam (VERSED) injection 2 mg, 2 mg, Intravenous, Once, Renee Pain, MD   naloxone [facility]) 0.4 MG/ML injection, , , ,    naloxone (NARCAN) injection 0.4 mg, 0.4 mg, Intravenous, PRN, Elsie Lincoln, MD, 0.4 mg at 03/02/19 0054   nicotine (NICODERM CQ - dosed in mg/24 hours) patch 14 mg, 14 mg, Transdermal, Daily, Karmen Bongo, MD, 14 mg at 03/01/19 1050   norepinephrine (LEVOPHED) 4mg  in 231mL premix infusion, 0-40 mcg/min, Intravenous, Titrated, Elsie Lincoln, MD   ondansetron [facility]) injection 4 mg, 4 mg, Intravenous, Q6H PRN, Karmen Bongo, MD   pantoprazole (PROTONIX) injection 40 mg, 40 mg, Intravenous, Daily, Seawell, Jaimie A, DO   potassium chloride SA (K-DUR) CR tablet 40 mEq, 40 mEq, Oral, Daily, Barton Dubois, MD, 40 mEq at 03/01/19 1050   simethicone  (MYLICON) chewable tablet 80 mg, 80 mg, Per Tube, QID PRN, Renee Pain, MD   sodium chloride flush (NS) 0.9 % injection 3 mL, 3 mL, Intravenous, Q12H, Karmen Bongo, MD, 3 mL at 03/01/19 2219   sodium chloride flush (NS) 0.9 % injection 3 mL, 3 mL, Intravenous, PRN, Karmen Bongo, MD  Home Medications  Prior to Admission medications   Medication Sig Start Date End Date Taking? Authorizing Provider  acetaminophen (TYLENOL) 325 MG tablet Take 2 tablets (650 mg total) by mouth every 8 (eight) hours as needed. 02/21/2019  Yes Virgel Manifold, MD  furosemide (LASIX) 40 MG tablet Take 1 tablet (40 mg total) by mouth 2 (two) times daily. 02/10/2019 03/27/19 Yes Virgel Manifold, MD  glipiZIDE (GLUCOTROL) 5 MG tablet Take 0.5 tablets (2.5 mg total) by mouth 2 (two) times daily before a meal. 02/15/2019  Yes Virgel Manifold, MD  Lidocaine, Anorectal, 5 % CREA Apply to sore on penis as needed for pain. 02/20/19  Yes Molpus, John, MD     Critical care time: 90 minutes.  The treatment and management of the patient's condition was required based on the threat of imminent deterioration. This time reflects time spent by the physician evaluating, providing care and managing the critically ill patient's care. The time was spent at the immediate bedside (or on the same floor/unit and dedicated to this patient's care). Time involved in separately billable procedures is NOT included int he critical care time indicated above. Family meeting and update time may be included above if and only if the patient is unable/incompetent to participate in clinical interview and/or decision making, and the discussion was necessary to determining treatment decisions.    Renee Pain, MD Board Certified by the ABIM, [place] Pager: [phone]

## 2019-03-02 NOTE — Progress Notes (Signed)
Patient transported to CT & back on ventilator without complication.  Noell Lorensen David RRT  

## 2019-03-02 NOTE — Progress Notes (Signed)
RN notified by NT that pt. Yelling and cursing while attempting to give care. Pt. Refused to have vital signs taken.

## 2019-03-02 NOTE — Procedures (Signed)
Central Venous Catheter Insertion Procedure Note [name] [id number] [birth year]  Procedure: Insertion of Central Venous Catheter Indications: Assessment of intravascular volume, Drug and/or fluid administration and Frequent blood sampling  Procedure Details Consent: Unable to obtain consent because of altered level of consciousness. Time Out: Verified patient identification, verified procedure, site/side was marked, verified correct patient position, special equipment/implants available, medications/allergies/relevent history reviewed, required imaging and test results available.  Performed  Maximum sterile technique was used including antiseptics, cap, gloves, gown, hand hygiene, mask and sheet. Skin prep: Chlorhexidine; local anesthetic administered A antimicrobial bonded/coated triple lumen catheter was placed in the left internal jugular vein using the Seldinger technique.  Evaluation Blood flow good Complications: No apparent complications Patient did tolerate procedure well. Chest X-ray ordered to verify placement.  CXR: normal.  Procedure performed under direct ultrasound guidance for real time vessel cannulation.      Montey Hora, [place] [facility] Pager: [phone]  or [phone] 03/02/2019, 2:05 AM

## 2019-03-02 NOTE — Progress Notes (Addendum)
eLink Physician-Brief Progress Note Patient Name: [name] DOB: December 04, 1960 MRN: [id number]   Date of Service  03/02/2019  HPI/Events of Note  Minimal urine output.  Still with bleeding from central line.  eICU Interventions  Dressing change on central line with thrombipad.  Monitor urine output. Held off on giving IVFs.  Pt is being diuresed.      Intervention Category Minor Interventions: Electrolytes abnormality - evaluation and management  Elsie Lincoln 03/02/2019, 9:17 PM   4:50 AM Notified of K 3.4 on istat.  Bleeding around the central line has stopped.  Plan> Replete K.

## 2019-03-02 NOTE — Consult Note (Signed)
I have placed a request via Secure Chat to Dr. Arrien requesting photos of the wound areas of concern to be placed in the EMR.   Judea Fennimore MSN,RN,CWOCN, CNS, CWON-AP [phone] 

## 2019-03-02 NOTE — Progress Notes (Signed)
EEG complete - results pending 

## 2019-03-02 NOTE — Progress Notes (Signed)
Pharmacy Antibiotic Note  [name] is a 58 y.o. male admitted on 19-Mar-2019.  Pharmacy has been consulted for Vancomycin/Zosyn dosing for aspiration PNA. Pt is s/p cardiac arrest with ROSC. WBC WNL. Renal function OK for now (may get worse s/p arrest).   Plan: Vancomycin 2000 mg IV x 1, then give 1250 mg IV q12h >>Estimated AUC: 524 Zosyn 3.375G IV q8h to be infused over 4 hours Trend WBC, temp, renal function  F/U infectious work-up Drug levels as indicated   Height: 6' (182.9 cm) Weight: 269 lb 13.5 oz (122.4 kg) IBW/kg (Calculated) : 77.6  Temp (24hrs), Avg:98.4 F (36.9 C), Min:97.3 F (36.3 C), Max:99.1 F (37.3 C)  Recent Labs  Lab 02/26/19 0527 02/26/19 2341  02/28/19 0405 03/01/19 0551 03/01/19 2327 03/01/19 2355 03/02/19 0144 03/02/19 0219 03/02/19 0227  WBC 7.8 6.6  --   --   --  12.1*  12.1*  --  15.5*  --   --   CREATININE 0.95  --    < > 0.94 1.04 0.99  --  1.20 1.22  --   LATICACIDVEN  --   --   --   --   --   --  6.2*  --   --  2.3*   < > = values in this interval not displayed.    Estimated Creatinine Clearance: 89.2 mL/min (by C-G formula based on SCr of 1.22 mg/dL).    Allergies  Allergen Reactions  . Haldol [Haloperidol] Other (See Comments)    Muscle spasms, loss of voluntary movement. However, pt has taken Thorazine on multiple occasions with no adverse effects.     Narda Bonds, PharmD, BCPS Clinical Pharmacist Phone: [phone]

## 2019-03-02 NOTE — Progress Notes (Addendum)
eLink Physician-Brief Progress Note Patient Name: [name] DOB: 1961-06-14 MRN: [id number]   Date of Service  03/02/2019  HPI/Events of Note  Pt admitted for CHF exacerbation, who arrested on the floors.  Pt has hx of medication non-compliance and substance abuse.  EF on last echo in May 2020 was 25-30%.  ROSC was obtained after about 14 minutes.  CT head negative for bleed.   eICU Interventions  Pt on empiric antibiotics.  Pt started on hypothermia protocol.  Check 2d echo. Trend troponins.  Pt could have had arrythmia given cardiomyopathy.  Ischemic changes on EKG.  Heparin for DVT prophylaxis.  PPI for GI prophylaxis.      Intervention Category Minor Interventions: Other:  Elsie Lincoln 03/02/2019, 2:47 AM   5:23 AM Notified regarding hypertension with BP 179/110 with the cuff and 231/112 on the arterial.  Pt is sedated and paralyzed.  He is on fentanyl, versed and nimbex.  Pt has been off levophed.  He is responding to diuretics. K 3.4, crea 1.2  Plan> Discontinue IVFs. Continue fentanyl (increased to max of 424mcg), versed and nimbex. Continue Lasix 60mg  q8hrs. Give hydralazine prn.  Pt has 29meq Kcl ordered daily.  Add 56meq KCl IV.

## 2019-03-02 NOTE — Consult Note (Signed)
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 Patient has long standing documented testicular pain/swelling perineal pain Pressure Injury POA: NA Measurement: see nursing flow sheet  Wound bed: pale pink non granular, moist  Drainage (amount, consistency, odor)  Periwound: intact, edema, epibole of the wound edges Dressing procedure/placement/frequency: This wound appear chronic in nature. Will add silver hydrofiber (Aquacel Ag+) to the wound bed for pale wound to address possible bioburdan. Cover with Vaseline gauze to hold in place rather than using tape and such.  May could use Mesh underwear as well.   Discussed POC with patient and bedside nurse.  Re consult if needed, will not follow at this time. Thanks  Dondra Rhett [employer], RN,CWOCN, CNS, [place] [phone])

## 2019-03-02 NOTE — Progress Notes (Signed)
Per NT, Pt. Became unresponsive while giving care. RR RN,  Code Team called to pts. Bedside. CPR administered. ON call for [facility] paged to make aware. Pt. Transferred to higher level of care.

## 2019-03-02 NOTE — Procedures (Signed)
Arterial Catheter Insertion Procedure Note [name] [id number] 25-Jun-1961  Procedure: Insertion of Arterial Catheter  Indications: Blood pressure monitoring and Frequent blood sampling  Procedure Details Consent: Unable to obtain consent because of altered level of consciousness. Time Out: Verified patient identification, verified procedure, site/side was marked, verified correct patient position, special equipment/implants available, medications/allergies/relevent history reviewed, required imaging and test results available.  Performed  Maximum sterile technique was used including antiseptics, cap, gloves, gown, hand hygiene, mask and sheet. Skin prep: Chlorhexidine; local anesthetic administered 20 gauge catheter was inserted into right radial artery using the Seldinger technique. ULTRASOUND GUIDANCE USED: NO Evaluation Blood flow good; BP tracing good. Complications: No apparent complications. JEH6 placed arterial catheter at this time due to Code Cool order and per RT protocol. No complications noted. 1 insertion attempt to place and secure line.    [name] 03/02/2019

## 2019-03-02 NOTE — Procedures (Signed)
Patient Name: DARRIN KOMAN  MRN: [id number]  Epilepsy Attending: Lora Havens  Referring Physician/Provider: Dr Renee Pain Date: @9 /07/2018 Duration: 25.14 mins  Patient history: 58 year old male with cardiac arrest.  EEG ordered to evaluate for seizures  Level of alertness: Comatose  AEDs during EEG study: Carbamazepine, Versed  Technical aspects: This EEG study was done with scalp electrodes positioned according to the 10-20 International system of electrode placement. Electrical activity was acquired at a sampling rate of 500Hz  and reviewed with a high frequency filter of 70Hz  and a low frequency filter of 1Hz . EEG data were recorded continuously and digitally stored.   Description: EEG showed generalized background suppression.  Intermittent polyspikes were seen, either generalized or in right frontocentral region.  EEG was not reactive to tactile stimulation. Hyperventilation and photic stimulation were not performed.  IMPRESSION: This study is suggestive of epileptogenicity which is likely generalized or arising from right frontocentral region.  No clear seizures were seen throughout the recording.  There is also evidence of profound diffuse encephalopathy.  Kareen Hitsman Barbra Sarks

## 2019-03-02 NOTE — Progress Notes (Signed)
Critical ABG called to Dr Nelda Marseille at [facility]- 7.70 PCO2-32 PO2-40 HCO3-41.2  FIO2-100%, PEEP-12, RR-12 & repeat ABG in 30 minutes  Kathie Dike RRT

## 2019-03-02 NOTE — Progress Notes (Signed)
Hypoglycemic Event  CBG: 53  Treatment: 25 mg IV dextrose  Symptoms: none  Follow-up CBG: Time:0420 CBG Result:82  Possible Reasons for Event: Code Cool  Comments/MD notified: Standing orders    [name] Chana Bode

## 2019-03-02 NOTE — Progress Notes (Signed)
Initial Nutrition Assessment  RD working remotely.  DOCUMENTATION CODES:   Obesity unspecified  INTERVENTION:   Tube feeding recommendations: - Vital High Protein @ 50 ml/hr (1200 ml/day) via OG tube - Pro-stat 60 ml BID  Tube feeding regimen provides 1600 kcal, 165 grams of protein, and 1003 ml of H2O.   NUTRITION DIAGNOSIS:   Inadequate oral intake related to acute illness as evidenced by NPO status.  GOAL:   Provide needs based on ASPEN/SCCM guidelines  MONITOR:   Vent status, Labs, Weight trends, Skin, I & O's  REASON FOR ASSESSMENT:   Ventilator    ASSESSMENT:   58 year old male who presented to the ED on 8/27 for evaluation after a fall. PMH of CHF, DM, homelessness, medical noncompliance, schizophrenia, polysubstance abuse, HTN.   8/31 - Code Blue, intubated, transferred to ICU, TTM 33 degrees C initiated  OG tube in place, currently to low intermittent suction.  Weight up 5 lbs since admission. Will utilize 120 kg as EDW. Per RN edema assessment, pt with mild pitting generalized edema, mild pitting edema to BUE, and moderate pitting edema to BLE.  RD will leave tube feeding recommendations.  Meal Completion: 75-100% x 7 meals prior to Code  Patient is currently intubated on ventilator support MV: 15.7 L/min Temp (24hrs), Avg:95.9 F (35.5 C), Min:90.5 F (32.5 C), Max:99.1 F (37.3 C)  Drips: Nimbex: 11.0 ml/hr Fentanyl: 35 ml/hr Versed: 10 ml/hr  Medications reviewed and include: Lasix, SSI q 4 hours, Lantus 15 units daily, Protonix, K-dur 40 mEq daily, IV calcium gluconate 1 gram once, IV abx  Labs reviewed: potassium 3.3, calcium ionized 1.08 CBG's: 53-237 x 12 hours  UOP: 5275 ml x 24 hours I/O's: -8.4 L since admit  NUTRITION - FOCUSED PHYSICAL EXAM:  Unable to complete at this time. RD working remotely.  Diet Order:   Diet Order            Diet NPO time specified  Diet effective now              EDUCATION NEEDS:   No  education needs have been identified at this time  Skin:  Skin Assessment: Reviewed RN Assessment (MASD to groin)  Last BM:  03/01/19  Height:   Ht Readings from Last 1 Encounters:  March 18, 2019 6' (1.829 m)    Weight:   Wt Readings from Last 1 Encounters:  03/02/19 122.4 kg    Ideal Body Weight:  80.9 kg  BMI:  Body mass index is 36.6 kg/m.  Estimated Nutritional Needs:   Kcal:  1320-1680  Protein:  162-180 grams  Fluid:  >/= 1.5 L    Gaynell Face, MS, RD, LDN Inpatient Clinical Dietitian Pager: [phone] Weekend/After Hours: [phone]

## 2019-03-02 NOTE — Progress Notes (Signed)
Message left for Pts. Contact person Luevenia Maxin to inform of pts. Transfer to higher level of care.

## 2019-03-02 NOTE — Progress Notes (Signed)
PT Cancellation Note  Patient Details Name: [name] MRN: [id number] DOB: [birth year]   Cancelled Treatment:    Reason Eval/Treat Not Completed: Patient not medically ready; patient coded overnight, now vented and sedated.  Will attempt another day.    Reginia Naas 03/02/2019, 9:06 AM Magda Kiel, [place] [phone] 03/02/2019

## 2019-03-02 NOTE — Progress Notes (Signed)
VAST consulted for STAT IV placement for angiogram. VAS RN called CT and requested 18G needles be tubed to 2H for line placement. Staff stated for this pt and study he only needed 20G line. VAS RN placed 20G USGIV as charted.

## 2019-03-02 NOTE — Progress Notes (Signed)
  Echocardiogram 2D Echocardiogram has been performed.  [name] 03/02/2019, 4:32 PM

## 2019-03-02 NOTE — Progress Notes (Signed)
NAME:  [name], MRN:  [id number], DOB:  1961-02-04, LOS: 4 ADMISSION DATE:  02/15/2019, CONSULTATION DATE: 03/02/2019 REFERRING MD:  Dr. Delrae SawyersMauricio Arrien, CHIEF COMPLAINT:  Post CPR resuscitation; acute hypoxemic respiratory failure  Brief History   58 year old with history of homelessness hypertension, polysubstance abuse, CHF.  Admitted with mechanical fall secondary to cocaine use. He has a known history of noncompliance with prescribed medications, multiple hospitalizations and documented history of leaving AMA.  On 9/4 patient went into asystolic arrest presumed secondary to acute on chronic heart failure exacerbation. ROSC achieved in 14 mins.  Past Medical History  Homelessness, polysubstance abuse, cocaine abuse, schizophrenia, sleep apnea, CHF, diabetes, hypertension, chronic foot pain  Significant Hospital Events   8/27- Admitted after mechanical fall 9/1- Asystolic arrest, hypothermia protocol.  Consults:  PCCM  Procedures:  OETT 9/1 Left IJ CVL 9/1  Significant Diagnostic Tests:  CT head (9/1): No acute intracranial abnormality  2D echo (11/07/2018): LVEF 25-30%, mildly dilated, moderate concentric LVH.  Diffuse LV hypokinesis.  Diastolic dysfunction.  RV volume and pressure overload.  LA moderately dilated.  RA severely dilated.  Small pericardial effusion.  Urine drug screen 8/28- positive for cocaine  Micro Data:  SARS-CoV-2 8/28-negative.  He has at least 20 negative tests since May 2020  Antimicrobials:  Vancomycin 9/1 > Zosyn 9/1 >  Interim history/subjective:    Objective   Blood pressure (!) 144/78, pulse 70, temperature (!) 90.5 F (32.5 C), temperature source Core, resp. rate (!) 24, height 6' (1.829 m), weight 122.4 kg, SpO2 96 %. CVP:  [10 mmHg-26 mmHg] 10 mmHg  Vent Mode: PRVC FiO2 (%):  [80 %-100 %] 80 % Set Rate:  [20 bmp-26 bmp] 26 bmp Vt Set:  [621[620 mL] 620 mL PEEP:  [8 cmH20] 8 cmH20 Plateau Pressure:  [23 cmH20-29 cmH20] 23  cmH20   Intake/Output Summary (Last 24 hours) at 03/02/2019 1015 Last data filed at 03/02/2019 1000 Gross per 24 hour  Intake 2029.6 ml  Output 9975 ml  Net -7945.4 ml   Filed Weights   03/01/19 0500 03/02/19 0001 03/02/19 0500  Weight: 117.5 kg 122.4 kg 122.4 kg    Examination: Gen:      No acute distress, chronically ill-appearing HEENT:  EOMI, sclera anicteric Neck:     No masses; no thyromegaly, ET tube Lungs:    Clear to auscultation bilaterally; normal respiratory effort CV:         Regular rate and rhythm; no murmurs Abd:      + bowel sounds; soft, non-tender; no palpable masses, no distension Ext:    No edema; adequate peripheral perfusion Skin:      Warm and dry; no rash Neuro: Sedated, paralyzed, unresponsive  Resolved Hospital Problem list     Assessment & Plan:  Briefly this is a 58 year old with history of CHF, cocaine abuse transferred to ICU after asystolic cardiac arrest  Cardiac arrest.  Presumed secondary to acute on chronic CHF in the setting of cocaine use, pulm edema Will need to rule out PE CT angiogram Continue 33 degrees hypothermia protocol Follow echocardiogram, troponins Lasix as needed for diuresis.  Patient is putting out significant urine on his own Stop Zaroxolyn Monitor CVP  Acute respiratory failure Continue full vent support Follow chest x-ray, ABG  Hypertension Stop lisinopril We will use hydralazine PRN.  Start standing Lopressor  Wound at base of penis Wound care consult.  Oozing from IJ line Thrombin pad  Leukocytosis On empiric coverage with Vanco, Zosyn  Check cultures, pro calcitonin  Diabetes mellitus SSI coverage, lantus  Acute encephalopathy post arrest CT head is negative for acute process Follow EEG  Family Called his cousin Candace over the telephone.  She was not aware that Marco is in the hospital and is his contact at [facility].  He has a father who is in [place] but has not been in touch with him  Hal Hope is going to discuss with rest of the family and will come in for a visit when she gets off work.  Best practice:  Diet: NPO Pain/Anxiety/Delirium protocol (if indicated): Ordered VAP protocol (if indicated): Ordered DVT prophylaxis: Hep SQ GI prophylaxis: PPI Glucose control: SSI, Lantus Mobility: Bed Code Status: Full Family Communication: None  Disposition: ICU   Critical care time: 48    The patient is critically ill with multiple organ system failure and requires high complexity decision making for assessment and support, frequent evaluation and titration of therapies, advanced monitoring, review of radiographic studies and interpretation of complex data.   Critical Care Time devoted to patient care services, exclusive of separately billable procedures, described in this note is 35 minutes.   Marshell Garfinkel MD [facility] Pager [phone] If no answer call [phone] 03/02/2019, 10:55 AM

## 2019-03-02 DEATH — deceased

## 2019-03-03 ENCOUNTER — Inpatient Hospital Stay (HOSPITAL_COMMUNITY): Payer: Medicare Other

## 2019-03-03 DIAGNOSIS — R569 Unspecified convulsions: Secondary | ICD-10-CM

## 2019-03-03 LAB — POCT I-STAT 4, (NA,K, GLUC, HGB,HCT)
Glucose, Bld: 85 mg/dL (ref 70–99)
Glucose, Bld: 81 mg/dL (ref 70–99)
Glucose, Bld: 77 mg/dL (ref 70–99)
Glucose, Bld: 79 mg/dL (ref 70–99)
Glucose, Bld: 81 mg/dL (ref 70–99)
HCT: 33 % — ABNORMAL LOW (ref 39.0–52.0)
HCT: 33 % — ABNORMAL LOW (ref 39.0–52.0)
HCT: 32 % — ABNORMAL LOW (ref 39.0–52.0)
HCT: 33 % — ABNORMAL LOW (ref 39.0–52.0)
HCT: 32 % — ABNORMAL LOW (ref 39.0–52.0)
Hemoglobin: 11.2 g/dL — ABNORMAL LOW (ref 13.0–17.0)
Hemoglobin: 11.2 g/dL — ABNORMAL LOW (ref 13.0–17.0)
Hemoglobin: 10.9 g/dL — ABNORMAL LOW (ref 13.0–17.0)
Hemoglobin: 11.2 g/dL — ABNORMAL LOW (ref 13.0–17.0)
Hemoglobin: 10.9 g/dL — ABNORMAL LOW (ref 13.0–17.0)
Potassium: 3.4 mmol/L — ABNORMAL LOW (ref 3.5–5.1)
Potassium: 3.7 mmol/L (ref 3.5–5.1)
Potassium: 3.7 mmol/L (ref 3.5–5.1)
Potassium: 3.9 mmol/L (ref 3.5–5.1)
Potassium: 4.1 mmol/L (ref 3.5–5.1)
Sodium: 142 mmol/L (ref 135–145)
Sodium: 143 mmol/L (ref 135–145)
Sodium: 142 mmol/L (ref 135–145)
Sodium: 143 mmol/L (ref 135–145)
Sodium: 142 mmol/L (ref 135–145)

## 2019-03-03 LAB — BASIC METABOLIC PANEL
Anion gap: 9 (ref 5–15)
BUN: 27 mg/dL — ABNORMAL HIGH (ref 6–20)
CO2: 34 mmol/L — ABNORMAL HIGH (ref 22–32)
Calcium: 8.3 mg/dL — ABNORMAL LOW (ref 8.9–10.3)
Chloride: 96 mmol/L — ABNORMAL LOW (ref 98–111)
Creatinine, Ser: 1.08 mg/dL (ref 0.61–1.24)
GFR calc Af Amer: >60 mL/min (ref >60)
GFR calc non Af Amer: >60 mL/min (ref >60)
Glucose, Bld: 98 mg/dL (ref 70–99)
Potassium: 3.7 mmol/L (ref 3.5–5.1)
Sodium: 139 mmol/L (ref 135–145)

## 2019-03-03 LAB — HEPATIC FUNCTION PANEL
ALT: 10 U/L (ref 0–44)
AST: 25 U/L (ref 15–41)
Albumin: 1.9 g/dL — ABNORMAL LOW (ref 3.5–5.0)
Alkaline Phosphatase: 126 U/L (ref 38–126)
Bilirubin, Direct: 0.5 mg/dL — ABNORMAL HIGH (ref 0.0–0.2)
Indirect Bilirubin: 0.8 mg/dL (ref 0.3–0.9)
Total Bilirubin: 1.3 mg/dL — ABNORMAL HIGH (ref 0.3–1.2)
Total Protein: 5.6 g/dL — ABNORMAL LOW (ref 6.5–8.1)

## 2019-03-03 LAB — POCT I-STAT 7, (LYTES, BLD GAS, ICA,H+H)
Acid-Base Excess: 14 mmol/L — ABNORMAL HIGH (ref 0.0–2.0)
Bicarbonate: 39 mmol/L — ABNORMAL HIGH (ref 20.0–28.0)
Calcium, Ion: 1.19 mmol/L (ref 1.15–1.40)
HCT: 32 % — ABNORMAL LOW (ref 39.0–52.0)
Hemoglobin: 10.9 g/dL — ABNORMAL LOW (ref 13.0–17.0)
O2 Saturation: 98 %
Patient temperature: 33.4
Potassium: 3.5 mmol/L (ref 3.5–5.1)
Sodium: 142 mmol/L (ref 135–145)
TCO2: 41 mmol/L — ABNORMAL HIGH (ref 22–32)
pCO2 arterial: 43.7 mmHg (ref 32.0–48.0)
pH, Arterial: 7.546 — ABNORMAL HIGH (ref 7.350–7.450)
pO2, Arterial: 81 mmHg — ABNORMAL LOW (ref 83.0–108.0)

## 2019-03-03 LAB — CBC
HCT: 31 % — ABNORMAL LOW (ref 39.0–52.0)
Hemoglobin: 8.8 g/dL — ABNORMAL LOW (ref 13.0–17.0)
MCH: 20.9 pg — ABNORMAL LOW (ref 26.0–34.0)
MCHC: 28.4 g/dL — ABNORMAL LOW (ref 30.0–36.0)
MCV: 73.5 fL — ABNORMAL LOW (ref 80.0–100.0)
Platelets: 197 10*3/uL (ref 150–400)
RBC: 4.22 MIL/uL (ref 4.22–5.81)
RDW: 22.6 % — ABNORMAL HIGH (ref 11.5–15.5)
WBC: 8.7 10*3/uL (ref 4.0–10.5)
nRBC: 0 % (ref 0.0–0.2)

## 2019-03-03 LAB — MAGNESIUM: Magnesium: 1.7 mg/dL (ref 1.7–2.4)

## 2019-03-03 LAB — TROPONIN I (HIGH SENSITIVITY): Troponin I (High Sensitivity): 103 ng/L (ref <18)

## 2019-03-03 LAB — URINE CULTURE
Culture: NO GROWTH
Special Requests: NORMAL

## 2019-03-03 LAB — PHOSPHORUS: Phosphorus: 4.2 mg/dL (ref 2.5–4.6)

## 2019-03-03 LAB — GLUCOSE, CAPILLARY
Glucose-Capillary: 65 mg/dL — ABNORMAL LOW (ref 70–99)
Glucose-Capillary: 121 mg/dL — ABNORMAL HIGH (ref 70–99)

## 2019-03-03 LAB — PROCALCITONIN: Procalcitonin: 5.26 ng/mL

## 2019-03-03 MED ORDER — DEXTROSE 50 % IV SOLN
INTRAVENOUS | Status: AC
  Administered 2019-03-03: 12.5 g via INTRAVENOUS
  Filled 2019-03-03: qty 50

## 2019-03-03 MED ORDER — POTASSIUM CHLORIDE 10 MEQ/50ML IV SOLN
10.0000 meq | INTRAVENOUS | Status: DC
  Administered 2019-03-03: 10 meq via INTRAVENOUS
  Filled 2019-03-03 (×4): qty 50

## 2019-03-03 MED ORDER — DEXTROSE 50 % IV SOLN
12.5000 g | INTRAVENOUS | Status: AC
  Administered 2019-03-03: 22:00:00 12.5 g via INTRAVENOUS

## 2019-03-03 MED ORDER — SODIUM CHLORIDE 0.9 % IV BOLUS
500.0000 mL | Freq: Once | INTRAVENOUS | Status: AC
  Administered 2019-03-03: 500 mL via INTRAVENOUS

## 2019-03-03 MED ORDER — MAGNESIUM SULFATE IN D5W 1-5 GM/100ML-% IV SOLN
1.0000 g | Freq: Once | INTRAVENOUS | Status: AC
  Administered 2019-03-03: 1 g via INTRAVENOUS
  Filled 2019-03-03: qty 100

## 2019-03-03 MED ORDER — NOREPINEPHRINE 4 MG/250ML-% IV SOLN
0.0000 ug/min | INTRAVENOUS | Status: DC
  Administered 2019-03-03: 2 ug/min via INTRAVENOUS
  Administered 2019-03-04: 6 ug/min via INTRAVENOUS
  Administered 2019-03-08: 2 ug/min via INTRAVENOUS
  Filled 2019-03-03 (×2): qty 250

## 2019-03-03 MED ORDER — LEVETIRACETAM IN NACL 1000 MG/100ML IV SOLN
1000.0000 mg | Freq: Two times a day (BID) | INTRAVENOUS | Status: DC
  Administered 2019-03-03 – 2019-03-09 (×12): 1000 mg via INTRAVENOUS
  Filled 2019-03-03 (×12): qty 100

## 2019-03-03 MED ORDER — VITAL HIGH PROTEIN PO LIQD
1000.0000 mL | ORAL | Status: DC
  Administered 2019-03-04: 03:00:00 1000 mL

## 2019-03-03 NOTE — Progress Notes (Signed)
Patient reached 36 degrees at 1740 and nimbex gtt turned off.  Facial twitching noted at approx. 1810.  Dr. Elsworth Soho made aware; instructed to wean fentanyl when patient reaches 37 degrees but to leave the versed at current rate of 10mg /hr.  Will continue to monitor.

## 2019-03-03 NOTE — Progress Notes (Signed)
NAME:  [name], MRN:  [id number], DOB:  [birth year], LOS: 5 ADMISSION DATE:  2018/11/02, CONSULTATION DATE: 03/02/2019 REFERRING MD:  Dr. Delrae SawyersMauricio Arrien, CHIEF COMPLAINT:  Post CPR resuscitation; acute hypoxemic respiratory failure  Brief History   58 year old with history of homelessness hypertension, polysubstance abuse, CHF.  Admitted with mechanical fall secondary to cocaine use. He has a known history of noncompliance with prescribed medications, multiple hospitalizations and documented history of leaving AMA.  On 9/1 patient went into asystolic arrest presumed secondary to acute on chronic heart failure exacerbation. ROSC achieved in 14 mins.  Past Medical History  Homelessness, polysubstance abuse, cocaine abuse, schizophrenia, sleep apnea, CHF, diabetes, hypertension, chronic foot pain  Significant Hospital Events   8/27- Admitted after mechanical fall 9/1- Asystolic arrest, hypothermia protocol.  Consults:  PCCM  Procedures:  OETT 9/1 Left IJ CVL 9/1  Significant Diagnostic Tests:  CT head (9/1): No acute intracranial abnormality  2D echo (11/07/2018): LVEF 25-30%, mildly dilated, moderate concentric LVH.  Diffuse LV hypokinesis.  Diastolic dysfunction.  RV volume and pressure overload.  LA moderately dilated.  RA severely dilated.  Small pericardial effusion.  Urine drug screen 8/28- positive for cocaine  CT angiogram chest 9/1 no pulmonary embolism, moderate left more than right effusions, anasarca  EEG 9/1  Intermittent polyspikes were seen, either generalized or in right frontocentral region epileptogenicity which is likely generalized or arising from right frontocentral region.  No clear seizures were seen   Echo 9/1 EF 35-40%, RVSP 38 mm  Micro Data:  SARS-CoV-2 8/28-negative.  He has at least 20 negative tests since May 2020  resp cx 9/1 >>  Antimicrobials:  Vancomycin 9/1 > Zosyn 9/1 >  Interim history/subjective:  Being rewarmed per  hypothermia protocol Remains on Versed and fentanyl and paralytic  Polyuric   Objective   Blood pressure (!) 125/93, pulse 71, temperature (!) 92.7 F (33.7 C), temperature source Bladder, resp. rate 10, height 6' (1.829 m), weight 113.3 kg, SpO2 100 %. CVP:  [14 mmHg-18 mmHg] 16 mmHg  Vent Mode: PRVC FiO2 (%):  [50 %-100 %] 50 % Set Rate:  [10 bmp-26 bmp] 10 bmp Vt Set:  [409[620 mL] 620 mL PEEP:  [8 cmH20-12 cmH20] 10 cmH20 Plateau Pressure:  [21 cmH20-27 cmH20] 24 cmH20   Intake/Output Summary (Last 24 hours) at 03/03/2019 [id number] Last data filed at 03/03/2019 0900 Gross per 24 hour  Intake 2304.51 ml  Output 6205 ml  Net -3900.49 ml   Filed Weights   03/02/19 0001 03/02/19 0500 03/03/19 0500  Weight: 122.4 kg 122.4 kg 113.3 kg    Examination: Gen:      No acute distress, chronically ill-appearing, comatose HEENT:  EOMI, sclera anicteric Neck:     No masses; no thyromegaly, ET tube Lungs:    Clear to auscultation bilaterally; normal respiratory effort CV:         Regular rate and rhythm; no murmurs Abd:      + bowel sounds; soft, non-tender; no palpable masses, no distension Ext:    No edema; stasis changes Skin:      Warm and dry; no rash, wound at base of penis appears clear, no discharge Neuro: Sedated, paralyzed, unresponsive  Chest x-ray 9/1 personally reviewed which shows ET tube in position, cardiomegaly and bilateral opacities consistent with edema  Resolved Hospital Problem list    Oozing from IJ line Thrombin pad  Assessment & Plan:  Briefly this is a 58 year old with history of CHF, cocaine abuse  transferred to ICU after asystolic cardiac arrest  Cardiac arrest.  Presumed secondary to acute on chronic CHF in the setting of cocaine use, Acute pulm edema  Hold diuretics since he is polyuric  Acute respiratory failure Continue full vent support Drop PEEP to 5 and FiO2 to 50%  Hypertension Stop lisinopril  hydralazine PRN.   Ct  Lopressor  Wound at base  of penis -does not appear infected Wound care following   Leukocytosis On empiric coverage with Vanco, Zosyn Check cultures, pro calcitonin  Diabetes mellitus SSI coverage, lantus  Acute encephalopathy post arrest CT head is negative for acute process  EEG showed spikes?  Seizure versus anoxic damage  Discussed with neurology, continue Depakote, LTM EEG  Prognosis guarded, we will see how he wakes up post rewarming  Best practice:  Diet: NPO Pain/Anxiety/Delirium protocol (if indicated): Ordered VAP protocol (if indicated): Ordered DVT prophylaxis: Hep SQ GI prophylaxis: PPI Glucose control: SSI, Lantus Mobility: Bed Code Status: Full Family Communication:  cousin Candace  Disposition: ICU   The patient is critically ill with multiple organ systems failure and requires high complexity decision making for assessment and support, frequent evaluation and titration of therapies, application of advanced monitoring technologies and extensive interpretation of multiple databases. Critical Care Time devoted to patient care services described in this note independent of APP/resident  time is 35 minutes.    Kara Mead MD. [place] [facility]. [facility] Pager [phone] If no response call [phone]    03/03/2019, 9:52 AM

## 2019-03-03 NOTE — Progress Notes (Signed)
LTM EEG hooked up and running - no initial skin breakdown - push button tested - neuro notified. RN notified of push button.

## 2019-03-03 NOTE — Progress Notes (Signed)
Notified Elink of lad results, awaiting orders for potassium replacement.  Updated on status of bleed from central line insertion site.  At present bleeding is minimal in comparison to the amount of bleeding at the beginning of the shift.  Dressing changed at [place] completely saturated.  At [place] asked Dr. Carson Myrtle look at site, and only a minimal amount of bleeding was noted.  Pressure dressing remains in tact at this time.

## 2019-03-03 NOTE — Progress Notes (Signed)
LTM reviewed till 42. Continues to show generalized spikes with overriding fast activity at 1-1.5Hz . No clear seizure.  Please refer to eeg report for details.

## 2019-03-03 NOTE — Procedures (Addendum)
Patient Name: [name]  MRN: [id number]  Epilepsy Attending: Lora Havens  Referring Physician/Provider: Dr Kara Mead Date: 03/03/2019 Duration: 25.04 mins  Patient history: 58 year old male with cardiac arrest.  EEG ordered to evaluate for seizures  Level of alertness: Comatose  AEDs during EEG study: carbamazepine  Technical aspects: This EEG study was done with scalp electrodes positioned according to the 10-20 International system of electrode placement. Electrical activity was acquired at a sampling rate of 500Hz  and reviewed with a high frequency filter of 70Hz  and a low frequency filter of 1Hz . EEG data were recorded continuously and digitally stored.   Description: EEG showed generalized spikes and polyspikes 1-1.5hz , which at times become more rhythmic but without clear evolution to suggest seizure. There was also single brief discharge lasting about 2 seconds during which there were generalized, synchronized spikes which didn't evolve and had no associated clinical signs and therefore likely to be BIRD( brief ictal-interictal rhythmic discharge). There is also generalized background suppression.  EEG was not reactive to tactile stimulation. Hyperventilation and photic stimulation were not performed.  IMPRESSION: This study is suggestive of generalized epileptogenicity which in the setting of cardiac arrest is likely suggestive of diffuse hypoxic/anoxic brai injury.  No clear seizures were seen throughout the recording.  There is also evidence of profound diffuse encephalopathy.     [name] [name]

## 2019-03-03 NOTE — Progress Notes (Signed)
LTM reviewed till 8. No definite seizures so far.   Please refer to eeg report for details.

## 2019-03-03 NOTE — Progress Notes (Signed)
Post ABG 

## 2019-03-03 NOTE — Consult Note (Addendum)
Neurology Consultation Reason for Consult: Seizure Referring Physician: Dr. Kara Mead  CC: Seizure  History is obtained from: Chart review as patient is comatose  HPI: [name] is a 58 y.o. male with history of type 2 diabetes, hypertension, cocaine use, hepatitis C, chronic paranoid schizophrenia, acute on chronic systolic and diastolic congestive heart failure, high degree AV block and homelessness who initially presented to [facility], [place] on 8/26 after a fall believed to be mechanical fall after cocaine use.  He was admitted with a working diagnosis of acute cocaine intoxication complicated by acute on chronic congestive heart failure and cardiogenic pulmonary edema. Per review of initial notes from admitting provider, he has history of medication noncompliance, frequent ER visits (94 ER visits in 24 admissions in last 6 months) and frequently leaving AMA.   On 8/31 evening, CODE BLUE was activated when patient suddenly became unresponsive while receiving nursing care.  Patient underwent CPR and ROSC was achieved after 14 minutes per chart review.  He was started on hypothermia protocol.  EEG was obtained on 03/02/2019 which showed generalized background suppression and intermittent polyspikes concerning for epileptogenicity.  EEG was repeated today which showed generalized periodic epileptiform discharges as well generalized synchronized spikes lasting for about 2 seconds concerning for brief ictal-interictal rhythmic discharge.  Neurology was consulted for further management seizures as well as  prognosis after cardiac arrest.   ROS: A ROS was unable to obtain due to altered mental status.   Past Medical History:  Diagnosis Date  . Chronic foot pain   . Cocaine abuse ([place])   . Diabetes mellitus without complication ([place])   . Hepatitis C    unsure   . Homelessness   . Hypertension   . Neuropathy   . Polysubstance abuse ([place])   . Schizophrenia ([place])   . Sleep apnea    . Systolic and diastolic CHF, chronic ([facility])     Family History  Problem Relation Age of Onset  . Hypertension Other   . Diabetes Other    Social History: Per chart review he has been smoking cigarettes. He has a 20.00 pack-year smoking history. He uses smokeless tobacco. He reports current alcohol use. He reports current drug use. Frequency: 7.00 times per week. Drugs: "Crack" cocaine, Cocaine, and Marijuana.  Exam: Current vital signs: BP 120/79   Pulse 89   Temp (!) 93.6 F (34.2 C) (Bladder)   Resp (!) 0   Ht 6' (1.829 m)   Wt 113.3 kg   SpO2 98%   BMI 33.88 kg/m  Vital signs in last 24 hours: Temp:  [90.9 F (32.7 C)-93.6 F (34.2 C)] 93.6 F (34.2 C) (09/02 1100) Pulse Rate:  [35-89] 89 (09/02 1130) Resp:  [0-26] 0 (09/02 1130) BP: (101-153)/(68-104) 120/79 (09/02 1100) SpO2:  [94 %-100 %] 98 % (09/02 1130) Arterial Line BP: (163-183)/(74-89) 163/74 (09/01 1330) FiO2 (%):  [50 %-100 %] 50 % (09/02 1112) Weight:  [113.3 kg] 113.3 kg (09/02 0500)   Physical Exam  General: Laying in bed, not in apparent distress Cardiovascular: Normal rate and rhythm Respiratory: Intubated Skin: Cold Extremities: Bilateral pedal edema Neuro: Does not open eyes to noxious stimuli, pupils small but equal and reactive to light, oculocephalic intact, corneal reflex negative, unable to elicit cough reflex, flaccid in all 4 extremities and does not withdraw to noxious stimuli   I have reviewed labs in epic and the results pertinent to this consultation are: Hemoglobin 8.8, BUN 27, CO2 34, albumin 1.9, total  bilirubin 1.3, direct bilirubin 0.5, normal liver enzymes, troponin elevated  I have reviewed the images obtained: CT head without contrast space 03/02/2011: No acute intracranial abnormality.   Assessment and plan: 58 year old male with multiple comorbidities and has history of medication noncompliance who had cardiac arrest on 8/31 while in the hospital, CPR was performed and ROSC  was achieved in 14 minutes.  Patient is currently on hypothermia protocol.  Epilepsy Cardiac arrest  Suspected anoxic/hypoxic brain injury -EEG showed generalized epileptiform discharges as well as brief ictal-interictal discharges which signify that patient is at an increased risk of seizures -Given patient's history, EEG is concerning for diffuse hypoxic/anoxic brain injury.  Recommendations -Due to potential epileptogenicity in the EEG, will start Keppra 1 g twice daily -Continue carbamazepine 100 mg twice daily -Continue long-term EEG. -We will attempt to escalate care only if patient has clinical seizures or if the subclinical seizures seem frequent enough that they will affect patient's mental status -Patient likely suffered hypoxic/anoxic brain injury.  Will obtain MRI brain without contrast for further evaluation after LTM is disconnected. -Continue seizure precautions -PRN IV Midazolam 4 mg for generalized tonic-clonic seizures lasting more than 2 minutes  CRITICAL CARE Performed by: Charlsie QuestPriyanka O Jenna Ardoin   Total critical care time: 75minutes  Critical care time was exclusive of separately billable procedures and treating other patients.  Critical care was necessary to treat or prevent imminent or life-threatening deterioration.  Critical care was time spent personally by me on the following activities: development of treatment plan with patient and/or surrogate as well as nursing, discussions with consultants, evaluation of patient's response to treatment, examination of patient, obtaining history from patient or surrogate, ordering and performing treatments and interventions, ordering and review of laboratory studies, ordering and review of radiographic studies, pulse oximetry and re-evaluation of patient's condition.

## 2019-03-03 NOTE — Progress Notes (Signed)
Patient reached 37 degrees at 2043.

## 2019-03-03 NOTE — Progress Notes (Addendum)
PT Cancellation/Discharge Note  Patient Details Name: ALCIDES NUTTING MRN: [id number] DOB: 1961-05-01   Cancelled Treatment:    Reason Eval/Treat Not Completed: Patient not medically ready; patient with medical decline.  Will sign off.  Please consult again if pt improves & able to participate.     Reginia Naas 03/03/2019, 1:58 PM  Magda Kiel, [place] [phone] 03/03/2019

## 2019-03-03 NOTE — Progress Notes (Signed)
Blood glucose 65 at 2104, initiated hypoglycemic protocol.  Dextrose given as ordered at 2134.  Repeat glucose 121.

## 2019-03-03 NOTE — Plan of Care (Signed)
  Problem: Education: Goal: Knowledge of General Education information will improve Description: Including pain rating scale, medication(s)/side effects and non-pharmacologic comfort measures Outcome: Progressing   Problem: Health Behavior/Discharge Planning: Goal: Ability to manage health-related needs will improve Outcome: Progressing   Problem: Clinical Measurements: Goal: Ability to maintain clinical measurements within normal limits will improve Outcome: Progressing Goal: Will remain free from infection Outcome: Progressing Goal: Diagnostic test results will improve Outcome: Progressing Goal: Respiratory complications will improve Outcome: Progressing Goal: Cardiovascular complication will be avoided Outcome: Progressing   Problem: Activity: Goal: Risk for activity intolerance will decrease Outcome: Progressing   Problem: Coping: Goal: Level of anxiety will decrease Outcome: Progressing   Problem: Elimination: Goal: Will not experience complications related to urinary retention Outcome: Progressing   Problem: Pain Managment: Goal: General experience of comfort will improve Outcome: Progressing   Problem: Safety: Goal: Ability to remain free from injury will improve Outcome: Progressing   Problem: Skin Integrity: Goal: Risk for impaired skin integrity will decrease Outcome: Progressing   Problem: Education: Goal: Ability to demonstrate management of disease process will improve Outcome: Progressing Goal: Ability to verbalize understanding of medication therapies will improve Outcome: Progressing Goal: Individualized Educational Video(s) Outcome: Progressing   Problem: Activity: Goal: Capacity to carry out activities will improve Description: Patient up and down from bed to chair 2 times today Outcome: Progressing   Problem: Cardiac: Goal: Ability to achieve and maintain adequate cardiopulmonary perfusion will improve Description: Edema with cool fingers  and toes Outcome: Progressing   Problem: Education: Goal: Ability to demonstrate management of disease process will improve Outcome: Progressing Goal: Ability to verbalize understanding of medication therapies will improve Outcome: Progressing Goal: Individualized Educational Video(s) Outcome: Progressing   Problem: Activity: Goal: Capacity to carry out activities will improve Outcome: Progressing   Problem: Cardiac: Goal: Ability to achieve and maintain adequate cardiopulmonary perfusion will improve Outcome: Progressing

## 2019-03-03 NOTE — Progress Notes (Signed)
Urine continues to decrease, BP dropping and maps below 65, SBP less than 100.  Notified Elink, awaitng orders.

## 2019-03-03 NOTE — Progress Notes (Signed)
EEG complete - results pending 

## 2019-03-03 NOTE — Progress Notes (Signed)
Nutrition Follow-up  DOCUMENTATION CODES:   Obesity unspecified  INTERVENTION:   Trickle tube feeding: - Vital High Protein @ 20 ml/hr (480 ml/day) via OG tube  Trickle tube feeding regimen provides 480 kcal, 42 grams of protein, and 401 ml of H2O.    Goal tube feeding regimen: - Vital High Protein @ 50 ml/hr (1200 ml/day) via OG tube - Pro-stat 60 ml BID  Goal tube feeding regimen provides 1600 kcal, 165 grams of protein, and 1003 ml of H2O.   NUTRITION DIAGNOSIS:   Inadequate oral intake related to acute illness as evidenced by NPO status.  Ongoing, being addressed via initiation of trickle TF  GOAL:   Provide needs based on ASPEN/SCCM guidelines  Progressing  MONITOR:   Vent status, Labs, Weight trends, I & O's, Skin, TF tolerance  REASON FOR ASSESSMENT:   Consult Enteral/tube feeding initiation and management (trickle)  ASSESSMENT:   58 year old male who presented to the ED on 8/27 for evaluation after a fall. PMH of CHF, DM, homelessness, medical noncompliance, schizophrenia, polysubstance abuse, HTN.  8/31 - Code Blue, intubated, transferred to ICU, TTM 33 degrees C initiated  Discussed pt with RN and during ICU rounds. RN reports pt with no active bowel sounds. Noted pt had a BM yesterday evening. CCM MD okay with starting trickle feeds.  Per Neurology note, pt with suspected anoxic/hypoxic brain injury.  Weight down 15 lbs since admission.  RD consulted for trickle tube feeding initiation and management.  OG tube remains in place, currently to low intermittent suction.  Patient is currently intubated on ventilator support MV: 6.1 L/min Temp (24hrs), Avg:92 F (33.3 C), Min:90.9 F (32.7 C), Max:95.2 F (35.1 C) BP (a-line); 121/67 MAP (a-line): 84  Drips: Nimbex: 11.0 ml/hr Fentanyl: 30 ml/hr Versed: 10 ml/hr  Medications reviewed and include: SSI, Protonix, IV Keppra, IV abx  Labs reviewed.  NUTRITION - FOCUSED PHYSICAL EXAM:   Most Recent Value  Orbital Region  No depletion  Upper Arm Region  No depletion  Thoracic and Lumbar Region  Unable to assess  Buccal Region  No depletion  Temple Region  No depletion  Clavicle Bone Region  No depletion  Clavicle and Acromion Bone Region  No depletion  Scapular Bone Region  Unable to assess  Dorsal Hand  No depletion  Patellar Region  No depletion  Anterior Thigh Region  Unable to assess  Posterior Calf Region  No depletion  Edema (RD Assessment)  Moderate [BUE, BLE]  Hair  Reviewed  Eyes  Unable to assess  Mouth  Unable to assess  Skin  Reviewed  Nails  Reviewed       Diet Order:   Diet Order            Diet NPO time specified  Diet effective now              EDUCATION NEEDS:   No education needs have been identified at this time  Skin:  Skin Assessment: Skin Integrity Issues: Other: non-pressure wound to penis  Last BM:  03/02/19  Height:   Ht Readings from Last 1 Encounters:  02/02/2019 6' (1.829 m)    Weight:   Wt Readings from Last 1 Encounters:  03/03/19 113.3 kg    Ideal Body Weight:  80.9 kg  BMI:  Body mass index is 33.88 kg/m.  Estimated Nutritional Needs:   Kcal:  2956-2130  Protein:  162-180 grams  Fluid:  >/= 1.5 L    Gaynell Face, MS,  RD, LDN Inpatient Clinical Dietitian Pager: [phone] Weekend/After Hours: [phone]

## 2019-03-03 NOTE — Progress Notes (Signed)
Orders received for bolus, remainder of bolus not complete, BP dropping, Notified Dr. Jimmy Footman of decreasing BP. See new orders.

## 2019-03-04 ENCOUNTER — Inpatient Hospital Stay (HOSPITAL_COMMUNITY): Payer: Medicare Other

## 2019-03-04 DIAGNOSIS — G40901 Epilepsy, unspecified, not intractable, with status epilepticus: Secondary | ICD-10-CM

## 2019-03-04 DIAGNOSIS — J9601 Acute respiratory failure with hypoxia: Secondary | ICD-10-CM

## 2019-03-04 LAB — CULTURE, RESPIRATORY W GRAM STAIN
Culture: NORMAL
Special Requests: NORMAL

## 2019-03-04 LAB — BASIC METABOLIC PANEL
Anion gap: 12 (ref 5–15)
BUN: 32 mg/dL — ABNORMAL HIGH (ref 6–20)
CO2: 31 mmol/L (ref 22–32)
Calcium: 8.2 mg/dL — ABNORMAL LOW (ref 8.9–10.3)
Chloride: 98 mmol/L (ref 98–111)
Creatinine, Ser: 1.54 mg/dL — ABNORMAL HIGH (ref 0.61–1.24)
GFR calc Af Amer: 57 mL/min — ABNORMAL LOW (ref >60)
GFR calc non Af Amer: 49 mL/min — ABNORMAL LOW (ref >60)
Glucose, Bld: 129 mg/dL — ABNORMAL HIGH (ref 70–99)
Potassium: 4.5 mmol/L (ref 3.5–5.1)
Sodium: 141 mmol/L (ref 135–145)

## 2019-03-04 LAB — VALPROIC ACID LEVEL: Valproic Acid Lvl: 64 ug/mL (ref 50.0–100.0)

## 2019-03-04 LAB — GLUCOSE, CAPILLARY
Glucose-Capillary: 129 mg/dL — ABNORMAL HIGH (ref 70–99)
Glucose-Capillary: 133 mg/dL — ABNORMAL HIGH (ref 70–99)
Glucose-Capillary: 136 mg/dL — ABNORMAL HIGH (ref 70–99)
Glucose-Capillary: 138 mg/dL — ABNORMAL HIGH (ref 70–99)
Glucose-Capillary: 142 mg/dL — ABNORMAL HIGH (ref 70–99)
Glucose-Capillary: 84 mg/dL (ref 70–99)

## 2019-03-04 LAB — CBC
HCT: 31.4 % — ABNORMAL LOW (ref 39.0–52.0)
Hemoglobin: 8.8 g/dL — ABNORMAL LOW (ref 13.0–17.0)
MCH: 21.3 pg — ABNORMAL LOW (ref 26.0–34.0)
MCHC: 28 g/dL — ABNORMAL LOW (ref 30.0–36.0)
MCV: 76 fL — ABNORMAL LOW (ref 80.0–100.0)
Platelets: 281 10*3/uL (ref 150–400)
RBC: 4.13 MIL/uL — ABNORMAL LOW (ref 4.22–5.81)
RDW: 23.5 % — ABNORMAL HIGH (ref 11.5–15.5)
WBC: 10.3 10*3/uL (ref 4.0–10.5)
nRBC: 0.2 % (ref 0.0–0.2)

## 2019-03-04 MED ORDER — VALPROATE SODIUM 500 MG/5ML IV SOLN
3000.0000 mg | Freq: Once | INTRAVENOUS | Status: DC
  Filled 2019-03-04: qty 30

## 2019-03-04 MED ORDER — FENTANYL CITRATE (PF) 100 MCG/2ML IJ SOLN
25.0000 ug | INTRAMUSCULAR | Status: DC | PRN
  Administered 2019-03-05 – 2019-03-09 (×8): 100 ug via INTRAVENOUS
  Filled 2019-03-04 (×10): qty 2

## 2019-03-04 MED ORDER — VALPROATE SODIUM 500 MG/5ML IV SOLN
500.0000 mg | Freq: Three times a day (TID) | INTRAVENOUS | Status: DC
  Administered 2019-03-04 – 2019-03-09 (×14): 500 mg via INTRAVENOUS
  Filled 2019-03-04 (×18): qty 5

## 2019-03-04 MED ORDER — VALPROATE SODIUM 500 MG/5ML IV SOLN
3000.0000 mg | Freq: Once | INTRAVENOUS | Status: AC
  Administered 2019-03-04: 3000 mg via INTRAVENOUS
  Filled 2019-03-04: qty 30

## 2019-03-04 NOTE — Progress Notes (Signed)
Assisted tele visit to patient with family member.  Marciel Offenberger Anderson, RN   

## 2019-03-04 NOTE — Progress Notes (Signed)
Reason for consult: seizure  Subjective: Patient had frequent subclinical seizures overnight.  No clinical seizures observed by nursing.  ROS: Unable to obtain due to poor mental status  Examination  Vital signs in last 24 hours: Temp:  [95.5 F (35.3 C)-99.3 F (37.4 C)] 98.6 F (37 C) (09/03 1200) Pulse Rate:  [87-109] 87 (09/03 1300) Resp:  [10-14] 10 (09/03 1300) BP: (90-133)/(51-89) 107/64 (09/03 1300) SpO2:  [92 %-99 %] 99 % (09/03 1300) FiO2 (%):  [40 %-50 %] 40 % (09/03 1107) Weight:  [111.2 kg] 111.2 kg (09/03 0500)  General: Laying in bed, not in apparent distress Cardiovascular: Normal rate and rhythm Respiratory: Intubated Skin: warm Extremities: Bilateral pedal edema Neuro: Does not open eyes to noxious stimuli, pupils small but equal and reactive to light, upoward deviation of eyes BL , oculocephalic intact, corneal reflex negative, +cough reflex, flaccid in all 4 extremities and does not withdraw to noxious stimuli   Basic Metabolic Panel: Recent Labs  Lab 03/01/19 0551  03/02/19 0144 03/02/19 0219  03/02/19 0636 03/02/19 0829  03/03/19 0218  03/03/19 1028 03/03/19 1215 03/03/19 1430 03/03/19 1713 03/04/19 0405  NA 139   < > 134* 133*   < > 140 142   < > 139   < > 143 142 143 142 141  K 3.9   < > 3.9 4.0   < > 3.0* 3.3*   < > 3.7   < > 3.7 3.7 3.9 4.1 4.5  CL 96*   < > 96* 96*  --  97* 94*  --  96*  --   --   --   --   --  98  CO2 34*   < > 31 31  --  29  --   --  34*  --   --   --   --   --  31  GLUCOSE 209*   < > 84 85  --  122* 133*   < > 98   < > 81 77 79 81 129*  BUN 18   < > 20 20  --  24* 26*  --  27*  --   --   --   --   --  32*  CREATININE 1.04   < > 1.20 1.22  --  1.16 1.00  --  1.08  --   --   --   --   --  1.54*  CALCIUM 8.7*   < > 8.7* 8.5*  --  8.8*  --   --  8.3*  --   --   --   --   --  8.2*  MG 2.0  --  1.9  --   --   --   --   --  1.7  --   --   --   --   --   --   PHOS  --   --  3.4  --   --   --   --   --  4.2  --   --   --   --    --   --    < > = values in this interval not displayed.    CBC: Recent Labs  Lab 02/26/19 0527 02/26/19 2341 03/01/19 2327  03/02/19 0144  03/03/19 0218  03/03/19 1028 03/03/19 1215 03/03/19 1430 03/03/19 1713 03/04/19 0405  WBC 7.8 6.6 12.1*  12.1*  --  15.5*  --  8.7  --   --   --   --   --  10.3  NEUTROABS 5.9 4.5 8.2*  --   --   --   --   --   --   --   --   --   --   HGB 8.8* 8.2* 9.0*  8.9*   < > 8.1*   < > 8.8*   < > 11.2* 10.9* 11.2* 10.9* 8.8*  HCT 31.7* 29.5* 33.2*  33.7*   < > 29.8*   < > 31.0*   < > 33.0* 32.0* 33.0* 32.0* 31.4*  MCV 75.7* 75.4* 78.7*  78.7*  --  77.2*  --  73.5*  --   --   --   --   --  76.0*  PLT 280 245 239  236  --  238  --  197  --   --   --   --   --  281   < > = values in this interval not displayed.     Coagulation Studies: Recent Labs    03/02/19 0144 03/02/19 0908  LABPROT 16.8* 19.8*  INR 1.4* 1.7*   I have reviewed the images obtained: CT head without contrast space 03/02/2011: No acute intracranial abnormality.   Assessment and plan: 58 year old male with multiple comorbidities and has history of medication noncompliance who had cardiac arrest on 8/31 while in the hospital, CPR was performed and ROSC was achieved in 14 minutes.  Patient is currently on hypothermia protocol.  Epilepsy Cardiac arrest  Suspected anoxic/hypoxic brain injury -EEG showed generalized epileptiform discharges as well as brief ictal-interictal discharges which signify that patient is at an increased risk of seizures -Given patient's history, EEG is concerning for diffuse hypoxic/anoxic brain injury.  Recommendations -Continue Keppra 1gm BID ( cant increase due to renal function) and CBZ 100mg  BID - Will load with VPA 3000mg  once and start 500mg  TID, check level 4 hours after loading dose -Patient likely suffered significant hypoxic/anoxic brain injury.  Will obtain MRI brain without contrast for further evaluation after LTM is  disconnected. -Continue seizure precautions -PRN IV Midazolam 4 mg for generalized tonic-clonic seizures lasting more than 2 minutes  CRITICAL CARE Performed by: Lora Havens   Total critical care time: 35 minutes  Critical care time was exclusive of separately billable procedures and treating other patients.  Critical care was necessary to treat or prevent imminent or life-threatening deterioration.  Critical care was time spent personally by me on the following activities: development of treatment plan with patient and/or surrogate as well as nursing, discussions with consultants, evaluation of patient's response to treatment, examination of patient, obtaining history from patient or surrogate, ordering and performing treatments and interventions, ordering and review of laboratory studies, ordering and review of radiographic studies, pulse oximetry and re-evaluation of patient's condition.

## 2019-03-04 NOTE — Progress Notes (Signed)
Notified Elink, spoke to [employer] regarding issue with urine, bladder scan greater than 650cc, attempt to irrigate foley was unsuccessful.  Awaiting orders/return call.

## 2019-03-04 NOTE — Progress Notes (Addendum)
NAME:  [name], MRN:  [id number], DOB:  [birth year], LOS: 6 ADMISSION DATE:  02/20/2019, CONSULTATION DATE: 03/02/2019 REFERRING MD:  Dr. Erin HearingMauricio Arrien, CHIEF COMPLAINT:  Post CPR resuscitation; acute hypoxemic respiratory failure  Brief History   58 year old homeless man with  hypertension, polysubstance abuse, CHF.  Admitted with mechanical fall secondary to cocaine use. He has a known history of noncompliance with prescribed medications, multiple hospitalizations and documented history of leaving AMA.  On 9/1 patient went into asystolic arrest presumed secondary to acute pulmonary edema. ROSC achieved in 14 mins.  Past Medical History  Homelessness, polysubstance abuse, cocaine abuse, schizophrenia, sleep apnea, CHF, diabetes, hypertension, chronic foot pain  Significant Hospital Events   8/27- Admitted after mechanical fall 9/1- Asystolic arrest, hypothermia protocol. 9/2 seizure activity on EEG, rewarmed  Consults:  PCCM  Procedures:  OETT 9/1 Left IJ CVL 9/1  Significant Diagnostic Tests:  CT head (9/1): No acute intracranial abnormality  2D echo (11/07/2018): LVEF 25-30%, mildly dilated, moderate concentric LVH.  Diffuse LV hypokinesis.  Diastolic dysfunction.  RV volume and pressure overload.  LA moderately dilated.  RA severely dilated.  Small pericardial effusion.  Urine drug screen 8/28- positive for cocaine  CT angiogram chest 9/1 no pulmonary embolism, moderate left more than right effusions, anasarca  EEG 9/1  Intermittent polyspikes were seen, either generalized or in right frontocentral region epileptogenicity which is likely generalized or arising from right frontocentral region.  No clear seizures were seen   Echo 9/1 EF 35-40%, RVSP 38 mm LTM EEG 9/2 >> (on versed )  generalized spikes with overriding fast activity at 1-1.5Hz . No clear seizure  Micro Data:  SARS-CoV-2 8/28-negative.  He has at least 20 negative tests since May 2020  resp cx  9/1 >> nml flora bld 9/1 >> ng  Antimicrobials:  Vancomycin 9/1 > 9/3 Zosyn 9/1 >  Interim history/subjective:   Remains critically ill, intubated On high-dose Versed, on LTM EEG Afebrile Urine output has decreased  Objective   Blood pressure 119/71, pulse 95, temperature 98.8 F (37.1 C), temperature source Oral, resp. rate 10, height 6' (1.829 m), weight 111.2 kg, SpO2 96 %. CVP:  [8 mmHg-24 mmHg] 14 mmHg  Vent Mode: PRVC FiO2 (%):  [40 %-50 %] 40 % Set Rate:  [10 bmp] 10 bmp Vt Set:  [620 mL] 620 mL PEEP:  [5 cmH20] 5 cmH20 Plateau Pressure:  [16 cmH20-23 cmH20] 20 cmH20   Intake/Output Summary (Last 24 hours) at 03/04/2019 0953 Last data filed at 03/04/2019 0900 Gross per 24 hour  Intake 2326.95 ml  Output 870 ml  Net 1456.95 ml   Filed Weights   03/02/19 0500 03/03/19 0500 03/04/19 0500  Weight: 122.4 kg 113.3 kg 111.2 kg    Examination: Gen:      No acute distress, chronically ill-appearing, comatose HEENT:  EOMI, sclera anicteric, mild pallor Neck:     No masses; no thyromegaly, ET tube Lungs:    Clear to auscultation bilaterally; no accessory muscle use, on vent CV:         Regular rate and rhythm; no murmurs Abd:      + bowel sounds; soft, non-tender; no palpable masses, no distension Ext:    No edema; stasis changes Skin:      Warm and dry; no rash, wound at base of penis appears clear, no discharge Neuro: Induced coma, eyes midline slight upward gaze  Chest x-ray 9/3 personally reviewed -bilateral lower lobe airspace disease plus effusions  Resolved Hospital Problem list    Oozing from IJ line Thrombin pad  Assessment & Plan:  Briefly this is a 58 year old with history of CHF, cocaine abuse transferred to ICU after asystolic cardiac arrest  Cardiac arrest.  Presumed secondary to acute on chronic CHF in the setting of cocaine use, Acute pulm edema Chronic systolic heart failure Now hypotensive after starting high-dose Versed  -Use Levophed as needed  for MEP 65   Acute respiratory failure Continue full vent support , not ready to wean  Hypertension Stop lisinopril  hydralazine PRN.   dc  Lopressor, now on pressors  Wound at base of penis -does not appear infected Wound care following   Leukocytosis ?  Aspiration pneumonia On empiric  Zosyn, DC vancomycin since cultures negative Check cultures, pro calcitonin  Diabetes mellitus SSI coverage, lantus  Acute encephalopathy post arrest CT head is negative for acute process  EEG showed Seizure   -Keppra -Continue Tegretol -Continue high-dose Versed per neurology based on LTM EEG monitoring -Obtain MRI after LTM EEG discontinued   Poor prognosis for meaningful neurological recovery has been conveyed to family  Best practice:  Diet: NPO , TFs Pain/Anxiety/Delirium protocol (if indicated): Ordered VAP protocol (if indicated): Ordered DVT prophylaxis: Hep SQ GI prophylaxis: PPI Glucose control: SSI, Lantus Mobility: Bed Code Status: Full Family Communication:  cousin Candace 9/3 Disposition: ICU   The patient is critically ill with multiple organ systems failure and requires high complexity decision making for assessment and support, frequent evaluation and titration of therapies, application of advanced monitoring technologies and extensive interpretation of multiple databases. Critical Care Time devoted to patient care services described in this note independent of APP/resident  time is 35 minutes.     Kara Mead MD. [place] Flood. [facility] Pager [phone] If no response call [phone]    03/04/2019, 9:53 AM

## 2019-03-04 NOTE — Progress Notes (Signed)
vLTM discontinued  No skin breakdown 

## 2019-03-04 NOTE — Progress Notes (Signed)
LTM EEG checked, restarted. Impedances checked and reset electrodes as needed.  No skin breakdown noted.

## 2019-03-04 NOTE — Progress Notes (Signed)
Orders received, removed foley.  Tip of foley was completely blocked with sediment.  Replaced foley, irrigated with 10cc sterile water.  Immediately 250cc dark orange urine with sediment and strings of clots were dumped.  Reconnected temperature probe to foley and restarted TTM monitoring.

## 2019-03-04 NOTE — Progress Notes (Signed)
Fentanyl 223ml flushed down sink. Witnessed by Paris Lore, RN.

## 2019-03-04 NOTE — Plan of Care (Signed)
  Problem: Education: Goal: Knowledge of General Education information will improve Description: Including pain rating scale, medication(s)/side effects and non-pharmacologic comfort measures Outcome: Progressing   Problem: Health Behavior/Discharge Planning: Goal: Ability to manage health-related needs will improve Outcome: Progressing   Problem: Clinical Measurements: Goal: Ability to maintain clinical measurements within normal limits will improve Outcome: Progressing Goal: Will remain free from infection Outcome: Progressing Goal: Diagnostic test results will improve Outcome: Progressing Goal: Respiratory complications will improve Outcome: Progressing Goal: Cardiovascular complication will be avoided Outcome: Progressing   Problem: Activity: Goal: Risk for activity intolerance will decrease Outcome: Progressing   Problem: Coping: Goal: Level of anxiety will decrease Outcome: Progressing   Problem: Elimination: Goal: Will not experience complications related to urinary retention Outcome: Progressing   Problem: Pain Managment: Goal: General experience of comfort will improve Outcome: Progressing   Problem: Safety: Goal: Ability to remain free from injury will improve Outcome: Progressing   Problem: Skin Integrity: Goal: Risk for impaired skin integrity will decrease Outcome: Progressing   Problem: Education: Goal: Ability to demonstrate management of disease process will improve Outcome: Progressing Goal: Ability to verbalize understanding of medication therapies will improve Outcome: Progressing Goal: Individualized Educational Video(s) Outcome: Progressing   Problem: Activity: Goal: Capacity to carry out activities will improve Description: Patient up and down from bed to chair 2 times today Outcome: Progressing   Problem: Cardiac: Goal: Ability to achieve and maintain adequate cardiopulmonary perfusion will improve Description: Edema with cool fingers  and toes Outcome: Progressing   Problem: Education: Goal: Ability to demonstrate management of disease process will improve Outcome: Progressing Goal: Ability to verbalize understanding of medication therapies will improve Outcome: Progressing Goal: Individualized Educational Video(s) Outcome: Progressing   Problem: Activity: Goal: Capacity to carry out activities will improve Outcome: Progressing   Problem: Cardiac: Goal: Ability to achieve and maintain adequate cardiopulmonary perfusion will improve Outcome: Progressing   Problem: Cardiac: Goal: Ability to achieve and maintain adequate cardiopulmonary perfusion will improve Outcome: Progressing   Problem: Neurologic: Goal: Promote progressive neurologic recovery Outcome: Progressing   Problem: Skin Integrity: Goal: Risk for impaired skin integrity will be minimized. Outcome: Progressing

## 2019-03-04 NOTE — Procedures (Signed)
Patient Name:[name] OQH:[id number] Epilepsy Attending:Mayleen Borrero Barbra Sarks Referring Physician/Provider:Dr Kara Mead Duration: 03/03/2019 1140 to 9/3/20201140  Patient history:58 year old male with cardiac arrest. EEG ordered to evaluate for seizures  Level of alertness:Comatose  AEDs during EEG study:carbamazepine, LEV  Technical aspects: This EEG study was done with scalp electrodes positioned according to the 10-20 International system of electrode placement. Electrical activity was acquired at a sampling rate of 500Hz  and reviewed with a high frequency filter of 70Hz  and a low frequency filter of 1Hz . EEG data were recorded continuously and digitally stored.  Description: EEG showed generalized spikes and polyspikes 1-1.5hz , which at times become more rhythmic but without clear evolution to suggest seizure. There was frequent  brief discharge lasting about 2-8 seconds during which there were generalized, synchronized spikes which didn't evolve and had no associated clinical signs and therefore likely to be BIRD( brief ictal-interictal rhythmic discharge). After 2300 on 03/03/2019, patient had frequent seizures during which synchronized generalized spoikes were seen with no clear clinical signs.There is also generalized background suppression. EEG was not reactive to tactile stimulation. Hyperventilation and photic stimulation were not performed.  IMPRESSION: This studyshowed frequent seizures with no clinical signs as well as generalized epileptogenicity which in the setting of cardiac arrest is likely suggestive of diffuse hypoxic/anoxic brai injury. There is also evidence of profound diffuse encephalopathy.

## 2019-03-04 NOTE — Progress Notes (Signed)
Pt's son doing an [place] video visit at this time. Updated on pt's condition.

## 2019-03-04 NOTE — Progress Notes (Signed)
LTM reviewed till 2045. Continues to have worsening GPEds 2-3Hz , BIRDs and seizures. Last one at 2001. Patient is already on 2 anti-seizure medications: LEV and VPA. Given patient's clinical history, this EEG is concerning for diffuse anoxic brain injury. Therefore before we escalate care, would unhook and obtain MRI Brain wo contrast to assess for hypoxic/anoxic brian injury.

## 2019-03-05 ENCOUNTER — Inpatient Hospital Stay (HOSPITAL_COMMUNITY): Payer: Medicare Other

## 2019-03-05 DIAGNOSIS — Z7189 Other specified counseling: Secondary | ICD-10-CM

## 2019-03-05 LAB — CBC
HCT: 28.4 % — ABNORMAL LOW (ref 39.0–52.0)
Hemoglobin: 7.8 g/dL — ABNORMAL LOW (ref 13.0–17.0)
MCH: 21.3 pg — ABNORMAL LOW (ref 26.0–34.0)
MCHC: 27.5 g/dL — ABNORMAL LOW (ref 30.0–36.0)
MCV: 77.6 fL — ABNORMAL LOW (ref 80.0–100.0)
Platelets: 192 10*3/uL (ref 150–400)
RBC: 3.66 MIL/uL — ABNORMAL LOW (ref 4.22–5.81)
RDW: 23.8 % — ABNORMAL HIGH (ref 11.5–15.5)
WBC: 9.7 10*3/uL (ref 4.0–10.5)
nRBC: 0.2 % (ref 0.0–0.2)

## 2019-03-05 LAB — BASIC METABOLIC PANEL
Anion gap: 8 (ref 5–15)
BUN: 33 mg/dL — ABNORMAL HIGH (ref 6–20)
CO2: 32 mmol/L (ref 22–32)
Calcium: 8.2 mg/dL — ABNORMAL LOW (ref 8.9–10.3)
Chloride: 101 mmol/L (ref 98–111)
Creatinine, Ser: 1.45 mg/dL — ABNORMAL HIGH (ref 0.61–1.24)
GFR calc Af Amer: >60 mL/min (ref >60)
GFR calc non Af Amer: 53 mL/min — ABNORMAL LOW (ref >60)
Glucose, Bld: 110 mg/dL — ABNORMAL HIGH (ref 70–99)
Potassium: 3.9 mmol/L (ref 3.5–5.1)
Sodium: 141 mmol/L (ref 135–145)

## 2019-03-05 LAB — POCT I-STAT 7, (LYTES, BLD GAS, ICA,H+H)
Acid-Base Excess: 13 mmol/L — ABNORMAL HIGH (ref 0.0–2.0)
Bicarbonate: 38.9 mmol/L — ABNORMAL HIGH (ref 20.0–28.0)
Calcium, Ion: 1.19 mmol/L (ref 1.15–1.40)
HCT: 29 % — ABNORMAL LOW (ref 39.0–52.0)
Hemoglobin: 9.9 g/dL — ABNORMAL LOW (ref 13.0–17.0)
O2 Saturation: 93 %
Potassium: 3.8 mmol/L (ref 3.5–5.1)
Sodium: 144 mmol/L (ref 135–145)
TCO2: 41 mmol/L — ABNORMAL HIGH (ref 22–32)
pCO2 arterial: 59 mmHg — ABNORMAL HIGH (ref 32.0–48.0)
pH, Arterial: 7.427 (ref 7.350–7.450)
pO2, Arterial: 70 mmHg — ABNORMAL LOW (ref 83.0–108.0)

## 2019-03-05 LAB — MAGNESIUM: Magnesium: 1.9 mg/dL (ref 1.7–2.4)

## 2019-03-05 LAB — POCT I-STAT 4, (NA,K, GLUC, HGB,HCT)
Glucose, Bld: 99 mg/dL (ref 70–99)
Glucose, Bld: 102 mg/dL — ABNORMAL HIGH (ref 70–99)
Glucose, Bld: 89 mg/dL (ref 70–99)
HCT: 35 % — ABNORMAL LOW (ref 39.0–52.0)
HCT: 35 % — ABNORMAL LOW (ref 39.0–52.0)
HCT: 33 % — ABNORMAL LOW (ref 39.0–52.0)
Hemoglobin: 11.9 g/dL — ABNORMAL LOW (ref 13.0–17.0)
Hemoglobin: 11.9 g/dL — ABNORMAL LOW (ref 13.0–17.0)
Hemoglobin: 11.2 g/dL — ABNORMAL LOW (ref 13.0–17.0)
Potassium: 3.3 mmol/L — ABNORMAL LOW (ref 3.5–5.1)
Potassium: 3.5 mmol/L (ref 3.5–5.1)
Potassium: 3.4 mmol/L — ABNORMAL LOW (ref 3.5–5.1)
Sodium: 143 mmol/L (ref 135–145)
Sodium: 142 mmol/L (ref 135–145)
Sodium: 143 mmol/L (ref 135–145)

## 2019-03-05 LAB — GLUCOSE, CAPILLARY
Glucose-Capillary: 105 mg/dL — ABNORMAL HIGH (ref 70–99)
Glucose-Capillary: 105 mg/dL — ABNORMAL HIGH (ref 70–99)
Glucose-Capillary: 110 mg/dL — ABNORMAL HIGH (ref 70–99)
Glucose-Capillary: 117 mg/dL — ABNORMAL HIGH (ref 70–99)
Glucose-Capillary: 153 mg/dL — ABNORMAL HIGH (ref 70–99)
Glucose-Capillary: 66 mg/dL — ABNORMAL LOW (ref 70–99)

## 2019-03-05 IMAGING — DX DG CHEST 1V PORT
1 series · 1 of 1 positions shown · non-contrast
Comparison: 08/17/2018

CLINICAL DATA: Chest pain.

EXAM:
PORTABLE CHEST 1 VIEW

[chest ap]
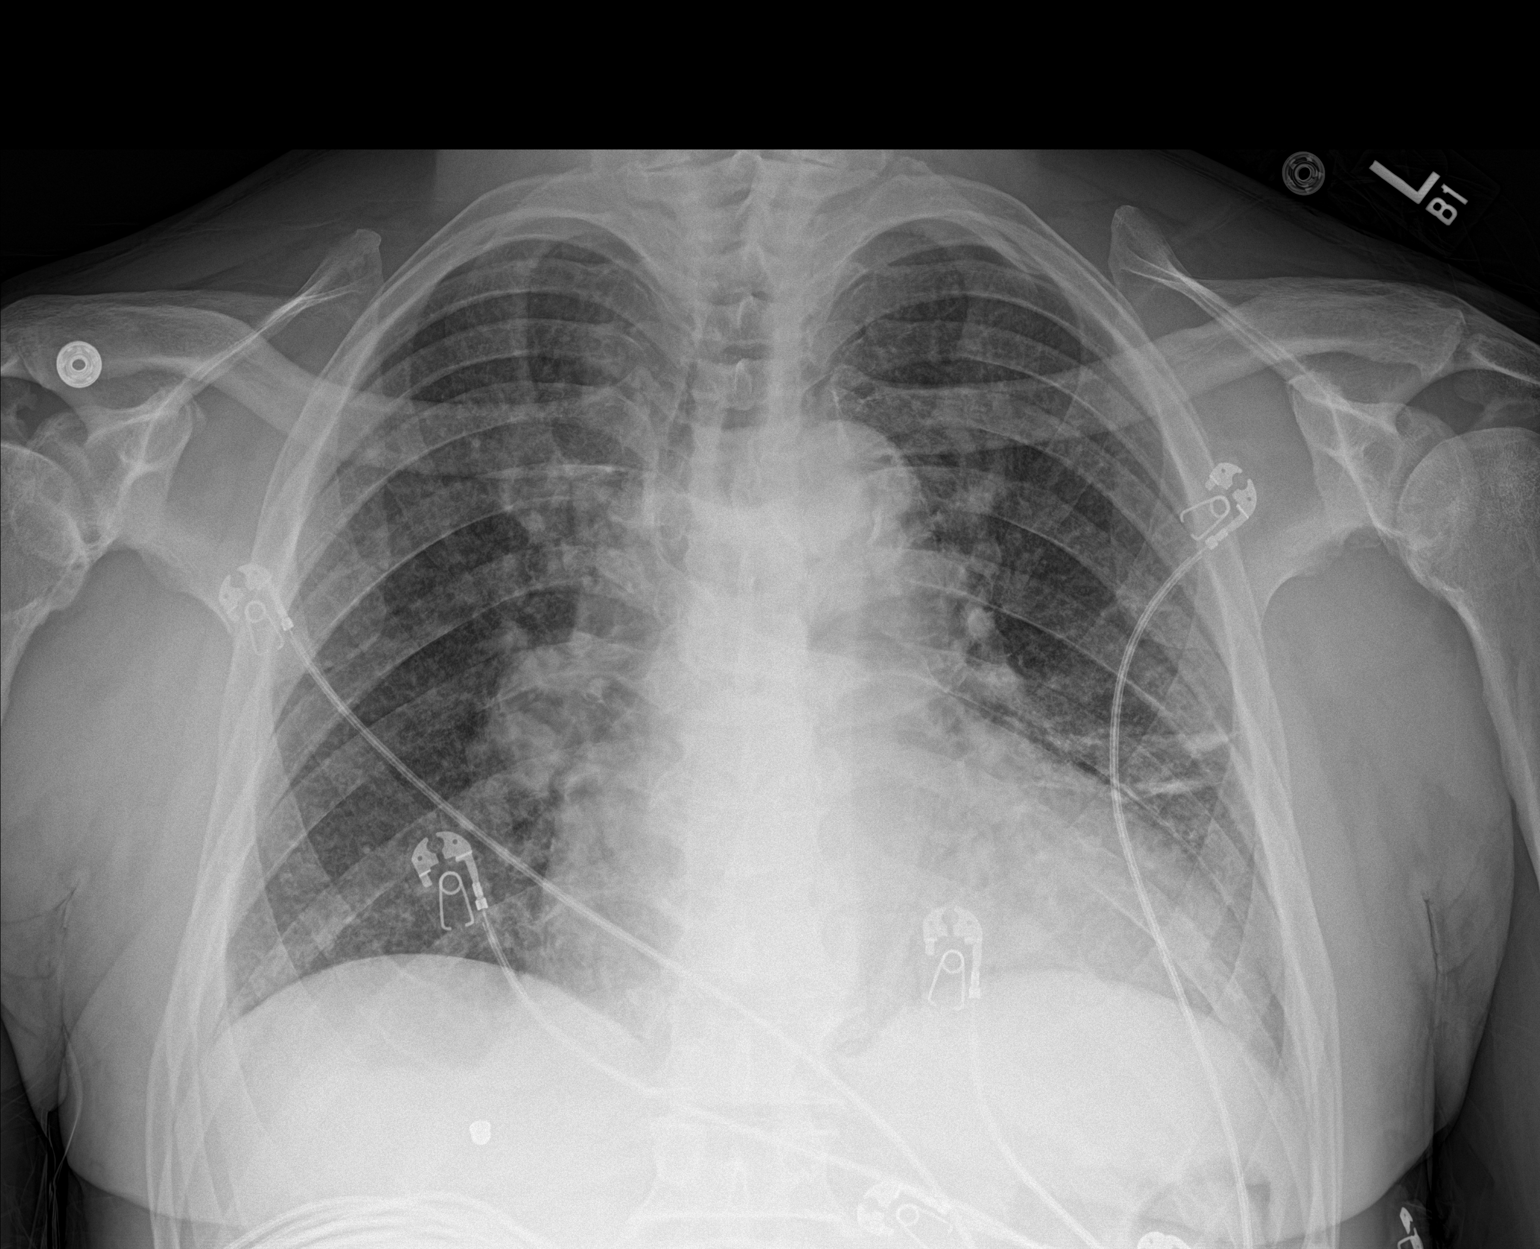

[1 of 1 positions shown; findings below may reference images not displayed]

FINDINGS: 5254 hours. The cardio pericardial silhouette is enlarged. There is
pulmonary vascular congestion without overt pulmonary edema.
Probable interstitial pulmonary edema. Left base atelectasis or
scarring is similar to prior. Otherwise aeration at the lung bases
has improved since previous study. The visualized bony structures of
the thorax are intact. Telemetry leads overlie the chest.
IMPRESSION: 1. Cardiomegaly with vascular congestion and probable interstitial
edema.
2. Interval improvement in bibasilar aeration with some residual
atelectasis or scarring in the left lower lung.

## 2019-03-05 MED ORDER — PRO-STAT SUGAR FREE PO LIQD
60.0000 mL | Freq: Two times a day (BID) | ORAL | Status: DC
  Administered 2019-03-05 – 2019-03-09 (×8): 60 mL
  Filled 2019-03-05 (×8): qty 60

## 2019-03-05 MED ORDER — MAGNESIUM SULFATE 2 GM/50ML IV SOLN
2.0000 g | Freq: Once | INTRAVENOUS | Status: AC
  Administered 2019-03-05: 2 g via INTRAVENOUS
  Filled 2019-03-05: qty 50

## 2019-03-05 MED ORDER — MAGNESIUM HYDROXIDE 400 MG/5ML PO SUSP: 30.0000 mL | Freq: Every day | ORAL | Status: DC | PRN

## 2019-03-05 MED ORDER — FUROSEMIDE 10 MG/ML IJ SOLN
60.0000 mg | Freq: Two times a day (BID) | INTRAMUSCULAR | Status: DC
  Administered 2019-03-05 – 2019-03-08 (×8): 60 mg via INTRAVENOUS
  Filled 2019-03-05 (×8): qty 6

## 2019-03-05 MED ORDER — VITAL HIGH PROTEIN PO LIQD
1000.0000 mL | ORAL | Status: DC
  Administered 2019-03-05 – 2019-03-08 (×4): 1000 mL

## 2019-03-05 MED ORDER — POTASSIUM CHLORIDE 20 MEQ/15ML (10%) PO SOLN
60.0000 meq | Freq: Two times a day (BID) | ORAL | Status: AC
  Administered 2019-03-05 (×2): 60 meq via ORAL
  Filled 2019-03-05 (×2): qty 45

## 2019-03-05 NOTE — Progress Notes (Signed)
Nutrition Follow-up  DOCUMENTATION CODES:   Obesity unspecified  INTERVENTION:   Increase tube feeding to goal regimen: - Vital High Protein @ 50 ml/hr (1200 ml/day) via OG tube - Pro-stat 60 ml BID  Goal tube feeding regimen provides1600kcal, 165grams of protein, and 1095m of H2O.  NUTRITION DIAGNOSIS:   Inadequate oral intake related to acute illness as evidenced by NPO status.  Ongoing, being addressed via TF  GOAL:   Provide needs based on ASPEN/SCCM guidelines  Met via TF  MONITOR:   Vent status, Labs, Weight trends, I & O's, Skin, TF tolerance  REASON FOR ASSESSMENT:   Consult Enteral/tube feeding initiation and management  ASSESSMENT:   58year old male who presented to the ED on 8/27 for evaluation after a fall. PMH of CHF, DM, homelessness, medical noncompliance, schizophrenia, polysubstance abuse, HTN.  8/31 - Code Blue, intubated, transferred to ICU, TTM 33 degrees C initiated 9/02 - rewarmed, trickle TF initiated  Discussed pt with RN and during ICU rounds. Per MD, okay to increase tube feeding to goal regimen.  Per CCM, plan is for MRI today. Ongoing discussion with family regarding prognosis.  OG tube remains in place. Pt with mild to moderate edema to BUE and BLE.  Patient is currently intubated on ventilator support MV: 11.9 L/min Temp (24hrs), Avg:98.5 F (36.9 C), Min:97.9 F (36.6 C), Max:99.1 F (37.3 C) BP (a-line): 153/57 MAP (a-line): 79  Drips: Versed: 10 ml/hr  Medications reviewed and include: Lasix, SSI, Protonix, KCl 60 mEq x 2, IV magnesium sulfate 2 grams once  Labs reviewed: hemoglobin 9.9 CBG's: 105-142 x 24 hours  UOP: 1550 ml x 24 hours I/O's: -11.7 L since admit  Diet Order:   Diet Order            Diet NPO time specified  Diet effective now              EDUCATION NEEDS:   No education needs have been identified at this time  Skin:  Skin Assessment: Skin Integrity Issues: Other: non-pressure  wound to penis  Last BM:  03/02/19  Height:   Ht Readings from Last 1 Encounters:  03/05/19 6' (1.829 m)    Weight:   Wt Readings from Last 1 Encounters:  03/05/19 112 kg    Ideal Body Weight:  80.9 kg  BMI:  Body mass index is 33.49 kg/m.  Estimated Nutritional Needs:   Kcal:  [address] Protein:  162-180 grams  Fluid:  >/= 1.5 L    KGaynell Face MS, RD, LDN Inpatient Clinical Dietitian Pager: [phone]/After Hours: [phone]

## 2019-03-05 NOTE — Progress Notes (Addendum)
Reason for consult: Seizure  Subjective: No acute events overnight.  Patient underwent MRI brain which showed diffuse anoxic brain injury as suspected  ROS: unable to obtain due to poor mental status  Examination  Vital signs in last 24 hours: Temp:  [96.8 F (36 C)-99.1 F (37.3 C)] 96.8 F (36 C) (09/04 1200) Pulse Rate:  [86-114] 103 (09/04 1118) Resp:  [10-28] 18 (09/04 1200) BP: (102-190)/(70-104) 136/104 (09/04 1200) SpO2:  [87 %-100 %] 95 % (09/04 1118) FiO2 (%):  [40 %] 40 % (09/04 1118) Weight:  [960[112 kg] 112 kg (09/04 0500)  General: Laying in bed, not in apparent distress Cardiovascular: Normal rate and rhythm Respiratory: Intubated Skin: warm Extremities: Bilateral pedal edema Neuro: Does not open eyes to noxious stimuli, pupils small but equal and reactive to light, oculocephalic intact, corneal reflex negative, +cough reflex, flaccid in all 4 extremities, barely withdraws to noxious stimuli in all extremities   Basic Metabolic Panel: Recent Labs  Lab 03/01/19 0551  03/02/19 0144 03/02/19 0219  03/02/19 0636 03/02/19 [id number]  03/03/19 0218  03/03/19 1215 03/03/19 1430 03/03/19 1713 03/04/19 0405 03/05/19 0442 03/05/19 0844  NA 139   < > 134* 133*   < > 140 142   < > 139   < > 142 143 142 141 141 144  K 3.9   < > 3.9 4.0   < > 3.0* 3.3*   < > 3.7   < > 3.7 3.9 4.1 4.5 3.9 3.8  CL 96*   < > 96* 96*  --  97* 94*  --  96*  --   --   --   --  98 101  --   CO2 34*   < > 31 31  --  29  --   --  34*  --   --   --   --  31 32  --   GLUCOSE 209*   < > 84 85  --  122* 133*   < > 98   < > 77 79 81 129* 110*  --   BUN 18   < > 20 20  --  24* 26*  --  27*  --   --   --   --  32* 33*  --   CREATININE 1.04   < > 1.20 1.22  --  1.16 1.00  --  1.08  --   --   --   --  1.54* 1.45*  --   CALCIUM 8.7*   < > 8.7* 8.5*  --  8.8*  --   --  8.3*  --   --   --   --  8.2* 8.2*  --   MG 2.0  --  1.9  --   --   --   --   --  1.7  --   --   --   --   --  1.9  --   PHOS  --   --  3.4   --   --   --   --   --  4.2  --   --   --   --   --   --   --    < > = values in this interval not displayed.    CBC: Recent Labs  Lab 02/26/19 2341 03/01/19 2327  03/02/19 0144  03/03/19 0218  03/03/19 1430 03/03/19 1713 03/04/19 0405 03/05/19 0442 03/05/19 0844  WBC 6.6 12.1*  12.1*  --  15.5*  --  8.7  --   --   --  10.3 9.7  --   NEUTROABS 4.5 8.2*  --   --   --   --   --   --   --   --   --   --   HGB 8.2* 9.0*  8.9*   < > 8.1*   < > 8.8*   < > 11.2* 10.9* 8.8* 7.8* 9.9*  HCT 29.5* 33.2*  33.7*   < > 29.8*   < > 31.0*   < > 33.0* 32.0* 31.4* 28.4* 29.0*  MCV 75.4* 78.7*  78.7*  --  77.2*  --  73.5*  --   --   --  76.0* 77.6*  --   PLT 245 239  236  --  238  --  197  --   --   --  281 192  --    < > = values in this interval not displayed.     Coagulation Studies: No results for input(s): LABPROT, INR in the last 72 hours.  Imaging MRI brain without contrast: Widespread anoxic brain injury with no mass-effect or herniation.  Small amount of subarachnoid blood in left parietal sulci.  Assessment and plan: 58 year old male with multiple comorbidities and has history of medication noncompliance who had cardiac arrest on 8/31 while in the hospital,CPR was performed and ROSC was achieved in41minutes. Patient is currently on hypothermia protocol.  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  His MRI brain shows diffuse anoxic injury. Therefore, it will most likely not help if we aggressively try to treat seizures. -Patient has suffered very significant brain injury and his chances of neurologic recovery are extremely poor. -Recommend palliative care consult and goals of care discussion -Continue Keppra and valproic acid   -Seizure precautions - PRN IV Ativan 2 mg for clinical seizures, myoclonic jerks   CRITICAL CARE Performed by: Lora Havens   Total critical care time: 35 minutes  Critical care time was exclusive of separately billable procedures and treating other patients.  Critical care was necessary to treat or prevent imminent or life-threatening deterioration.  Critical care was time spent personally by me on the following activities: development of treatment plan with patient and/or surrogate as well as nursing, discussions with consultants, evaluation of patient's response to treatment, examination of patient, obtaining history from patient or surrogate, ordering and performing treatments and interventions, ordering and review of laboratory studies, ordering and review of radiographic studies, pulse oximetry and re-evaluation of patient's condition.

## 2019-03-05 NOTE — Progress Notes (Signed)
NAME:  Taylor MemosFrederick L Luchsinger, MRN:  [id number], DOB:  24-May-1961, LOS: 7 ADMISSION DATE:  05/29/2019, CONSULTATION DATE: 03/02/2019 REFERRING MD:  Dr. Erin HearingMauricio Arrien, CHIEF COMPLAINT:  Post CPR resuscitation; acute hypoxemic respiratory failure  Brief History   58 year old homeless man with  hypertension, polysubstance abuse, HFrEF.  Admitted with mechanical fall secondary to cocaine use. He has a known history of noncompliance with prescribed medications, multiple hospitalizations and documented history of leaving AMA.  On 9/1 patient went into asystolic in-hospital arrest presumed secondary to acute pulmonary edema. ROSC achieved in 14 mins. TTM with rewarming complete on the evening of 9/2.  Past Medical History  Homelessness, polysubstance abuse, cocaine abuse, schizophrenia, sleep apnea, CHF, diabetes, hypertension, chronic foot pain  Significant Hospital Events   8/27- Admitted after mechanical fall 9/1- Asystolic arrest, hypothermia protocol. 9/2 seizure activity on EEG, rewarmed  Consults:  PCCM  Procedures:  OETT 9/1 Left IJ CVL 9/1  Significant Diagnostic Tests:  CT head (9/1): No acute intracranial abnormality  2D echo (11/07/2018): LVEF 25-30%, mildly dilated, moderate concentric LVH.  Diffuse LV hypokinesis.  Diastolic dysfunction.  RV volume and pressure overload.  LA moderately dilated.  RA severely dilated.  Small pericardial effusion.  Urine drug screen 8/28- positive for cocaine  CT angiogram chest 9/1 no pulmonary embolism, moderate left more than right effusions, anasarca  EEG 9/1  Intermittent polyspikes were seen, either generalized or in right frontocentral region epileptogenicity which is likely generalized or arising from right frontocentral region.  No clear seizures were seen   Echo 9/1 EF 35-40%, RVSP 38 mm LTM EEG 9/2 >> (on versed )  generalized spikes with overriding fast activity at 1-1.5Hz . No clear seizure  Micro Data:  SARS-CoV-2  8/28-negative.  He has at least 20 negative tests since May 2020  resp cx 9/1 >> nml flora, final bld 9/1 >>   Antimicrobials:  Vancomycin 9/1 > 9/3 Zosyn 9/1 >  Interim history/subjective:   Remains critically ill, intubated, nonresponsive. Concern for seizure activity overnight and given PRNs. On high-dose Versed. Planning for MRI today. Cousin has been main Management consultantdecision maker so far, but patient has 2 twin sons that he has only known for a few years who are also involved.  Objective   Blood pressure (!) 190/78, pulse 97, temperature 97.9 F (36.6 C), temperature source Oral, resp. rate (!) 28, height 6' (1.829 m), weight 112 kg, SpO2 95 %. CVP:  [14 mmHg-16 mmHg] 16 mmHg  Vent Mode: PRVC FiO2 (%):  [40 %] 40 % Set Rate:  [10 bmp] 10 bmp Vt Set:  [620 mL] 620 mL PEEP:  [5 cmH20] 5 cmH20 Plateau Pressure:  [16 cmH20-29 cmH20] 29 cmH20   Intake/Output Summary (Last 24 hours) at 03/05/2019 0837 Last data filed at 03/05/2019 0700 Gross per 24 hour  Intake 1733.41 ml  Output 2425 ml  Net -691.59 ml   Filed Weights   03/03/19 0500 03/04/19 0500 03/05/19 0500  Weight: 113.3 kg 111.2 kg 112 kg    Examination: Gen:   Elderly man laying in bed no acute distress, comatose HEENT: Normocephalic, atraumatic.  Pupils pinpoint and nonreactive.  Anicteric sclera. Neck:     No masses, left IJ CVC. Lungs:    Breathing comfortably on the vent, breathing above the set rate.  Clear to auscultation bilaterally.  Thin clear secretions from ET tube. CV:        Regular rate and rhythm, no murmur.   Abd:  Soft, nontender, nondistended.   Ext: Significant brawny edema in all extremities. GU:  Brawny, chronic changes to the scrotum.  Ulcer bandaged.  Foley in place. Skin:      Warm, dry, no rash. Neuro:    Exam performed on Versed infusion.  Pupils pinpoint and nonreactive.  Positive pharyngeal gag, no cough with tracheal suctioning.  No corneal reflex.  Minimal grimacing to pain.  No response to  verbal stimulation.  Chest x-ray 9/4 reviewed: Likely bilateral layering effusions with silhouetting of bilateral hemidiaphragms.  Increased interstitial markings bilaterally suspicious for pulmonary edema.  Metallic foreign body in the right hemithorax present from previous admissions.  Resolved Hospital Problem list    Oozing from IJ line Thrombin pad  Assessment & Plan:  Briefly this is a 58 year old with history of CHF, cocaine abuse transferred to ICU after asystolic cardiac arrest  Cardiac arrest-asystole.  Presumed secondary to acute on chronic HFrEF in the setting of cocaine use, acute pulm edema. Chronic systolic heart failure Previously requiring vasopressors.  Negative fluid balance, but still significantly volume overloaded. -Norepinephrine as needed to maintain map greater than 65 -Needs aggressive diuresis -Maintain normothermia, euglycemia, and normal electrolytes. -Ongoing discussion with family regarding prognosis.  Will contact them after MRI today. -Due to previous hypotension not tolerating beta-blocker and ACE inhibitor.  Will restart these when able.  Acute hypoxic respiratory failure following cardiac arrest.  Acute pulmonary edema.  Bilateral pleural effusions. ABG shows chronic compensated hypercapnia. -Continue full vent support-low tidal volume ventilation.  Titrate FiO2 down as able. -Diuresis  Hypertension -Holding lisinopril -Can use hydralazine PRN for stained SBP greater than 180  Wound at base of penis -does not appear infected -Appreciate wound care's assistance  Leukocytosis-suspect this was a stress response. -Continue to follow cultures -DC antibiotics and monitor  Diabetes mellitus- appropriate BGs currently -con't SSI PRN -Goal blood glucose 140-180 while admitted to the ICU  Acute encephalopathy post- arrest CT head is negative for acute process. EEG demonstrating seizures-portends a very poor prognosis. -Con't Keppra & Tegretol  -Continue Versed per neurology  -MRI today  AKI post- arrest - Continue to hold ACE inhibitor -Continue to monitor intake and output and record  Guarded prognosis.  Will discuss further with the family following results of MRI today.  Best practice:  Diet: NPO , TFs Pain/Anxiety/Delirium protocol (if indicated): Ordered VAP protocol (if indicated): Ordered DVT prophylaxis: Hep SQ GI prophylaxis: PPI Glucose control: SSI Mobility: Bed Code Status: Full Family Communication:  cousin Candace -we will contact this afternoon Disposition: ICU  This patient is critically ill with multiple organ system failure which requires frequent high complexity decision making, assessment, support, evaluation, and titration of therapies. This was completed through the application of advanced monitoring technologies and extensive interpretation of multiple databases. During this encounter critical care time was devoted to patient care services described in this note for 45 minutes.  Julian Hy, DO 03/05/19 9:22 AM [facility]

## 2019-03-05 NOTE — Progress Notes (Signed)
Pt transported to and from MRI department on ventilator with no issues.

## 2019-03-05 NOTE — Progress Notes (Addendum)
I spoke to [name]'s cousin [name] and [name] to update them on his status. His MRI results demonstrate severe anoxic injury, which was suspected with his post arrest seizures. Candace and Tressie Ellis understand his poor prognosis and want to discuss with [name] father who is still living and Stuart's twin brother before making further care decisions. All questions were answered.   Julian Hy, DO 03/05/19 3:10 PM [facility]   I spoke to [name]'s family- his father [name], his father's wife [name], [phone]), his mother [name] [phone]). They understand the severity of his condition. His father feels that he would not want to be dependent on machines and would favor withdrawing aggressive care in favor of comfort care. His mother [name] is planning on driving from [place] to see him this weekend. His cousin [name] is going to come visit him today or tomorrow. The family will come to a consensus regarding ongoing goals for his care.  Additional 50 minutes of time spent in family discussions.   Julian Hy, DO 03/05/19 4:07 PM [facility]

## 2019-03-05 NOTE — Procedures (Signed)
Patient Name:Godric MANDEEP KISER CHE:[id number] Epilepsy Attending:Decklyn Hyder Barbra Sarks Referring Physician/Provider:DrRakesh Elsworth Soho Duration: 03/04/2019 1238  to 03/04/2019 2107  Patient history:58 year old male with cardiac arrest. EEG ordered to evaluate for seizures  Level of alertness:Comatose  AEDs during EEG study:carbamazepine, LEV  Technical aspects: This EEG study was done with scalp electrodes positioned according to the 10-20 International system of electrode placement. Electrical activity was acquired at a sampling rate of 500Hz  and reviewed with a high frequency filter of 70Hz  and a low frequency filter of 1Hz . EEG data were recorded continuously and digitally stored.  Description: EEG showedgeneralized periodic spikes and polyspikes at the arte of 2-3Hz . There were also frequent seizures with no clinical signs during which synchronized generalized spoikes were seen.EEG was not reactive to tactile stimulation. Hyperventilation and photic stimulation were not performed.  IMPRESSION: This studyshowed frequent seizures with no clinical signs as well asgeneralizedepileptogenicitywhich in the setting of cardiac arrest is likely suggestive of diffuse hypoxic/anoxic brai injury.There is also evidence of profound diffuse encephalopathy.  Martena Emanuele Barbra Sarks

## 2019-03-06 LAB — CBC
HCT: 29.8 % — ABNORMAL LOW (ref 39.0–52.0)
Hemoglobin: 8.3 g/dL — ABNORMAL LOW (ref 13.0–17.0)
MCH: 21 pg — ABNORMAL LOW (ref 26.0–34.0)
MCHC: 27.9 g/dL — ABNORMAL LOW (ref 30.0–36.0)
MCV: 75.3 fL — ABNORMAL LOW (ref 80.0–100.0)
Platelets: 191 10*3/uL (ref 150–400)
RBC: 3.96 MIL/uL — ABNORMAL LOW (ref 4.22–5.81)
RDW: 24.2 % — ABNORMAL HIGH (ref 11.5–15.5)
WBC: 9.9 10*3/uL (ref 4.0–10.5)
nRBC: 0.3 % — ABNORMAL HIGH (ref 0.0–0.2)

## 2019-03-06 LAB — BASIC METABOLIC PANEL
Anion gap: 10 (ref 5–15)
BUN: 30 mg/dL — ABNORMAL HIGH (ref 6–20)
CO2: 32 mmol/L (ref 22–32)
Calcium: 9 mg/dL (ref 8.9–10.3)
Chloride: 101 mmol/L (ref 98–111)
Creatinine, Ser: 1.23 mg/dL (ref 0.61–1.24)
GFR calc Af Amer: >60 mL/min (ref >60)
GFR calc non Af Amer: >60 mL/min (ref >60)
Glucose, Bld: 136 mg/dL — ABNORMAL HIGH (ref 70–99)
Potassium: 3.7 mmol/L (ref 3.5–5.1)
Sodium: 143 mmol/L (ref 135–145)

## 2019-03-06 LAB — GLUCOSE, CAPILLARY
Glucose-Capillary: 128 mg/dL — ABNORMAL HIGH (ref 70–99)
Glucose-Capillary: 131 mg/dL — ABNORMAL HIGH (ref 70–99)
Glucose-Capillary: 133 mg/dL — ABNORMAL HIGH (ref 70–99)
Glucose-Capillary: 138 mg/dL — ABNORMAL HIGH (ref 70–99)
Glucose-Capillary: 138 mg/dL — ABNORMAL HIGH (ref 70–99)
Glucose-Capillary: 143 mg/dL — ABNORMAL HIGH (ref 70–99)
Glucose-Capillary: 149 mg/dL — ABNORMAL HIGH (ref 70–99)

## 2019-03-06 MED ORDER — POTASSIUM CHLORIDE 20 MEQ/15ML (10%) PO SOLN
60.0000 meq | Freq: Two times a day (BID) | ORAL | Status: AC
  Administered 2019-03-06 (×2): 60 meq via ORAL
  Filled 2019-03-06 (×2): qty 45

## 2019-03-06 MED ORDER — MAGNESIUM SULFATE 2 GM/50ML IV SOLN
2.0000 g | Freq: Once | INTRAVENOUS | Status: AC
  Administered 2019-03-06: 2 g via INTRAVENOUS
  Filled 2019-03-06: qty 50

## 2019-03-06 MED ORDER — HYDRALAZINE HCL 20 MG/ML IJ SOLN
10.0000 mg | Freq: Three times a day (TID) | INTRAMUSCULAR | Status: AC | PRN
  Administered 2019-03-06 – 2019-03-07 (×3): 10 mg via INTRAVENOUS
  Filled 2019-03-06 (×4): qty 1

## 2019-03-06 MED ORDER — CARVEDILOL 3.125 MG PO TABS
3.1250 mg | ORAL_TABLET | Freq: Two times a day (BID) | ORAL | Status: DC
  Administered 2019-03-06 – 2019-03-07 (×2): 3.125 mg
  Filled 2019-03-06 (×2): qty 1

## 2019-03-06 NOTE — Progress Notes (Signed)
Video call completed with wife and son. No response from patient. Provided vital signs and answered questions for wife.

## 2019-03-06 NOTE — Social Work (Addendum)
CSW acknowledging consult, per RN notes "Son, Rickard Patience, called to speak with charge RN concerning "private personal information." Son also requested a social worker call him. Social work consult placed, and charge RN made aware. Will continue to monitor."  Also note per Charge RN note "Spoke to son Nicole Kindred. Questions regarding plan of care and how and whom legally makes decisions and withdrawal of support. Answered  questions. Social work consult placed by primary nurse."  There are currently no Advanced Directives or any documentation of POA/HCPOA. Messaged RN Lenna Sciara and charge RN Colletta [place] and requested they reach out to ethics pager if there are multiple family members seeking to make care decisions for patient to better guide son and other family members.   [name], MSW, [place] [employer] [phone]

## 2019-03-06 NOTE — Progress Notes (Signed)
Cousin Candace called(password confirmed) for an update on patient and requested tele visit. This RN assisted cousin with e-link tele visit and played patients favorite music. Per request, playing patient [name] and [name]. Encouraged cousin to call anytime. Will continue to monitor.  Lucius Conn, RN

## 2019-03-06 NOTE — Progress Notes (Signed)
Son calling to see if he can get ssn of patient. Told he cannot get this information, we need to meet with all children as there is no POA> Pallative consult placed. Patient stated his older brother stated he will not be involved. Other sons are up for pallative meeting parents of patient coming in to town tomorrow.

## 2019-03-06 NOTE — Progress Notes (Signed)
Kendra from [facility] called to check on patient and updated on patient status. Will call back with any changes in patient condition.

## 2019-03-06 NOTE — Progress Notes (Signed)
Assisted pt tele visit through [facility] with cousin.

## 2019-03-06 NOTE — Progress Notes (Signed)
Assisted tele visit to patient with son.  Lenord Fralix M, RN  

## 2019-03-06 NOTE — Progress Notes (Signed)
eLink Physician-Brief Progress Note Patient Name: [name] DOB: 16-Jan-1961 MRN: [id number]   Date of Service  03/06/2019  HPI/Events of Note  SBP 183. Asking for prn hydralazine.  Note revewed. Cr 1.2   eICU Interventions  Hydralazine prn ordered     Intervention Category Intermediate Interventions: Hypertension - evaluation and management  Elmer Sow 03/06/2019, 9:18 PM

## 2019-03-06 NOTE — Progress Notes (Signed)
NAME:  [name], MRN:  [id number], DOB:  Jun 03, 1961, LOS: 8 ADMISSION DATE:  03-25-2019, CONSULTATION DATE: 03/02/2019 REFERRING MD:  Dr. Sander Radon, CHIEF COMPLAINT:  Post CPR resuscitation; acute hypoxemic respiratory failure  Brief History   58 year old homeless man with  hypertension, polysubstance abuse, HFrEF.  Admitted with mechanical fall secondary to cocaine use. He has a known history of noncompliance with prescribed medications, multiple hospitalizations and documented history of leaving AMA.  On 9/1 patient went into asystolic in-hospital arrest presumed secondary to acute pulmonary edema. ROSC achieved in 14 mins. TTM with rewarming complete on the evening of 9/2.  Past Medical History  Homelessness, polysubstance abuse, cocaine abuse, schizophrenia, sleep apnea, CHF, diabetes, hypertension, chronic foot pain  Significant Hospital Events   8/27- Admitted after mechanical fall 9/1- Asystolic arrest, hypothermia protocol. 9/2 seizure activity on EEG, rewarmed  Consults:  PCCM  Procedures:  OETT 9/1 Left IJ CVL 9/1  Significant Diagnostic Tests:  CT head (9/1): No acute intracranial abnormality  2D echo (11/07/2018): LVEF 25-30%, mildly dilated, moderate concentric LVH.  Diffuse LV hypokinesis.  Diastolic dysfunction.  RV volume and pressure overload.  LA moderately dilated.  RA severely dilated.  Small pericardial effusion.  Urine drug screen 8/28- positive for cocaine  CT angiogram chest 9/1 no pulmonary embolism, moderate left more than right effusions, anasarca  EEG 9/1  Intermittent polyspikes were seen, either generalized or in right frontocentral region epileptogenicity which is likely generalized or arising from right frontocentral region.  No clear seizures were seen   Echo 9/1 EF 35-40%, RVSP 38 mm LTM EEG 9/2 >> (on versed )  generalized spikes with overriding fast activity at 1-1.5Hz . No clear seizure  MRI brain 9/4- diffuse cerebral anoxic  injury  CXR 9/4- b/l layering pleural effusions  Micro Data:  SARS-CoV-2 8/28-negative.  He has at least 20 negative tests since May 2020  resp cx 9/1 >> nml flora, final bld 9/1 >>   Antimicrobials:  Vancomycin 9/1 > 9/3 Zosyn 9/1 >  Interim history/subjective:   Remains critically ill, intubated, nonresponsive. Concern for seizure activity overnight and given PRNs. On high-dose Versed. Planning for MRI today. Cousin has been main Media planner so far, but patient has 2 twin sons that he has only known for a few years who are also involved.  Objective   Blood pressure (!) 148/77, pulse 96, temperature 98.8 F (37.1 C), temperature source Bladder, resp. rate 14, height 6' (1.829 m), weight 109.7 kg, SpO2 97 %. CVP:  [11 mmHg-12 mmHg] 12 mmHg  Vent Mode: PRVC FiO2 (%):  [40 %-50 %] 50 % Set Rate:  [10 bmp] 10 bmp Vt Set:  [620 mL] 620 mL PEEP:  [5 cmH20] 5 cmH20 Plateau Pressure:  [4 cmH20-18 cmH20] 16 cmH20   Intake/Output Summary (Last 24 hours) at 03/06/2019 1126 Last data filed at 03/06/2019 1000 Gross per 24 hour  Intake 1057.1 ml  Output 9750 ml  Net -8692.9 ml  Net for admission -19.7 L Filed Weights   03/04/19 0500 03/05/19 0500 03/06/19 0500  Weight: 111.2 kg 112 kg 109.7 kg    Examination: Gen:   Elderly man lying in bed no acute distress. HEENT: Normocephalic, atraumatic.  Anicteric sclera Neck:    Trachea midline.  L IJ CVC Lungs:    Clear to auscultation bilaterally, breathing comfortably on the vent.  Minimal thin secretions from the ET tube. CV:      Minimally tachycardic, regular rhythm. Abd:  Soft, nontender, nondistended Ext: Brawny edema, especially in the lower extremities.  Diffuse anasarca, slightly improved compared to previous exam GU:  Chronic chronic changes to the scrotum with skin thickening.  Penile ulcer bandaged. Skin:     Warm, dry, no rashes or other wounds. Neuro:    Exam performed on versed infusion, PERRL. No corneal reflex.  +occulocephalic.  No response to verbal or painful stimulation-trapezius squeeze, or deep nailbed pressure in any extremity.  No pharyngeal gag.  Positive tracheal cough reflex.   Resolved Hospital Problem list    Oozing from IJ line Thrombin pad  Assessment & Plan:  Briefly this is a 58 year old with history of CHF, cocaine abuse transferred to ICU after asystolic cardiac arrest  Cardiac arrest-asystole.  Presumed secondary to acute on chronic HFrEF in the setting of cocaine use, acute pulm edema. Chronic systolic heart failure. Previously requiring vasopressors.  Negative fluid balance, but still significantly volume overloaded. -Needs aggressive diuresis, currently having a great response -Maintain normothermia, euglycemia, and normal electrolytes -Ongoing discussion with family.  His mother [place] be coming from [place].   -We will cautiously restart beta-blocker.  Acute hypoxic respiratory failure following cardiac arrest.  Acute pulmonary edema.  Bilateral pleural effusions. ABG shows chronic compensated hypercapnia. -Continue full vent support.  Not a candidate for extubation given mental status. - VAP prevention protocol  Hypertension -Continue holding lisinopril due to recovering AKI -starting Coreg -Can use hydralazine PRN for stained SBP greater than 180  Wound at base of penis -does not appear infected -Patient wound care's assistance  Leukocytosis-suspect this was a stress response. -Continue to monitor off antibiotics -Continue to follow cultures  Diabetes mellitus- appropriate BGs currently -continue sliding scale insulin PRN -Goal BG 140-180 while admitted to the ICU  Acute encephalopathy post- arrest with severe anoxic brain injury on MRI and seizures. MRI confirms diffuse injury. EEG demonstrating seizures-portends a very poor prognosis. -Continue Keppra, valproate, and Tegretol with Versed infusion -Ongoing discussions with the patient's family regarding his  prognosis  AKI post- arrest, recovering - Continue to hold ACE inhibitor; as his renal function improves we may be able to restart this in the next few days -Continue to monitor I/O  Very poor prognosis.   Best practice:  Diet: NPO , TFs Pain/Anxiety/Delirium protocol (if indicated): Ordered VAP protocol (if indicated): Ordered DVT prophylaxis: Hep SQ GI prophylaxis: PPI Glucose control: SSI Mobility: Bed Code Status: Full Family Communication: Ongoing family discussions- spoke with Candace. Mom is coming Sunday. Candace wants to leave decision making to his parents Burna MortimerWanda and Leonette MostCharles. Disposition: ICU  This patient is critically ill with multiple organ system failure which requires frequent high complexity decision making, assessment, support, evaluation, and titration of therapies. This was completed through the application of advanced monitoring technologies and extensive interpretation of multiple databases. During this encounter critical care time was devoted to patient care services described in this note for 40 minutes.  Steffanie DunnLaura P Liana Camerer, DO 03/06/19 12:00 PM [facility]

## 2019-03-06 NOTE — Progress Notes (Signed)
Assisted tele visit to patient with family member.  Shavon Ashmore M, RN  

## 2019-03-06 NOTE — Progress Notes (Signed)
Spoke to son Nicole Kindred. Questions regarding plan of care and how and whom legally makes decisions and withdrawal of support. Answered  questions. Social work consult placed by primary nurse.

## 2019-03-06 NOTE — Progress Notes (Signed)
Son, [name], called to speak with charge RN concerning "private personal information." Son also requested a social worker call him. Social work consult placed, and charge RN made aware. Will continue to monitor.  Lucius Conn, RN

## 2019-03-07 DIAGNOSIS — Z515 Encounter for palliative care: Secondary | ICD-10-CM | POA: Insufficient documentation

## 2019-03-07 DIAGNOSIS — Z7189 Other specified counseling: Secondary | ICD-10-CM | POA: Insufficient documentation

## 2019-03-07 LAB — CBC
HCT: 28.8 % — ABNORMAL LOW (ref 39.0–52.0)
Hemoglobin: 8.1 g/dL — ABNORMAL LOW (ref 13.0–17.0)
MCH: 21.5 pg — ABNORMAL LOW (ref 26.0–34.0)
MCHC: 28.1 g/dL — ABNORMAL LOW (ref 30.0–36.0)
MCV: 76.4 fL — ABNORMAL LOW (ref 80.0–100.0)
Platelets: 180 10*3/uL (ref 150–400)
RBC: 3.77 MIL/uL — ABNORMAL LOW (ref 4.22–5.81)
RDW: 25.3 % — ABNORMAL HIGH (ref 11.5–15.5)
WBC: 8.3 10*3/uL (ref 4.0–10.5)
nRBC: 0.4 % — ABNORMAL HIGH (ref 0.0–0.2)

## 2019-03-07 LAB — CULTURE, BLOOD (ROUTINE X 2)
Culture: NO GROWTH
Culture: NO GROWTH

## 2019-03-07 LAB — BASIC METABOLIC PANEL
Anion gap: 8 (ref 5–15)
BUN: 29 mg/dL — ABNORMAL HIGH (ref 6–20)
CO2: 32 mmol/L (ref 22–32)
Calcium: 9.1 mg/dL (ref 8.9–10.3)
Chloride: 105 mmol/L (ref 98–111)
Creatinine, Ser: 1.1 mg/dL (ref 0.61–1.24)
GFR calc Af Amer: >60 mL/min (ref >60)
GFR calc non Af Amer: >60 mL/min (ref >60)
Glucose, Bld: 131 mg/dL — ABNORMAL HIGH (ref 70–99)
Potassium: 3.9 mmol/L (ref 3.5–5.1)
Sodium: 145 mmol/L (ref 135–145)

## 2019-03-07 LAB — GLUCOSE, CAPILLARY
Glucose-Capillary: 112 mg/dL — ABNORMAL HIGH (ref 70–99)
Glucose-Capillary: 120 mg/dL — ABNORMAL HIGH (ref 70–99)
Glucose-Capillary: 124 mg/dL — ABNORMAL HIGH (ref 70–99)
Glucose-Capillary: 128 mg/dL — ABNORMAL HIGH (ref 70–99)
Glucose-Capillary: 143 mg/dL — ABNORMAL HIGH (ref 70–99)
Glucose-Capillary: 160 mg/dL — ABNORMAL HIGH (ref 70–99)

## 2019-03-07 LAB — MAGNESIUM
Magnesium: 1.9 mg/dL (ref 1.7–2.4)
Magnesium: 1.9 mg/dL (ref 1.7–2.4)

## 2019-03-07 IMAGING — DX DG CHEST 2V
2 series · 2 of 2 positions shown · non-contrast
Comparison: 08/22/2018

CLINICAL DATA: Chest pain, shortness of breath

EXAM:
CHEST - 2 VIEW

[w chest pa]
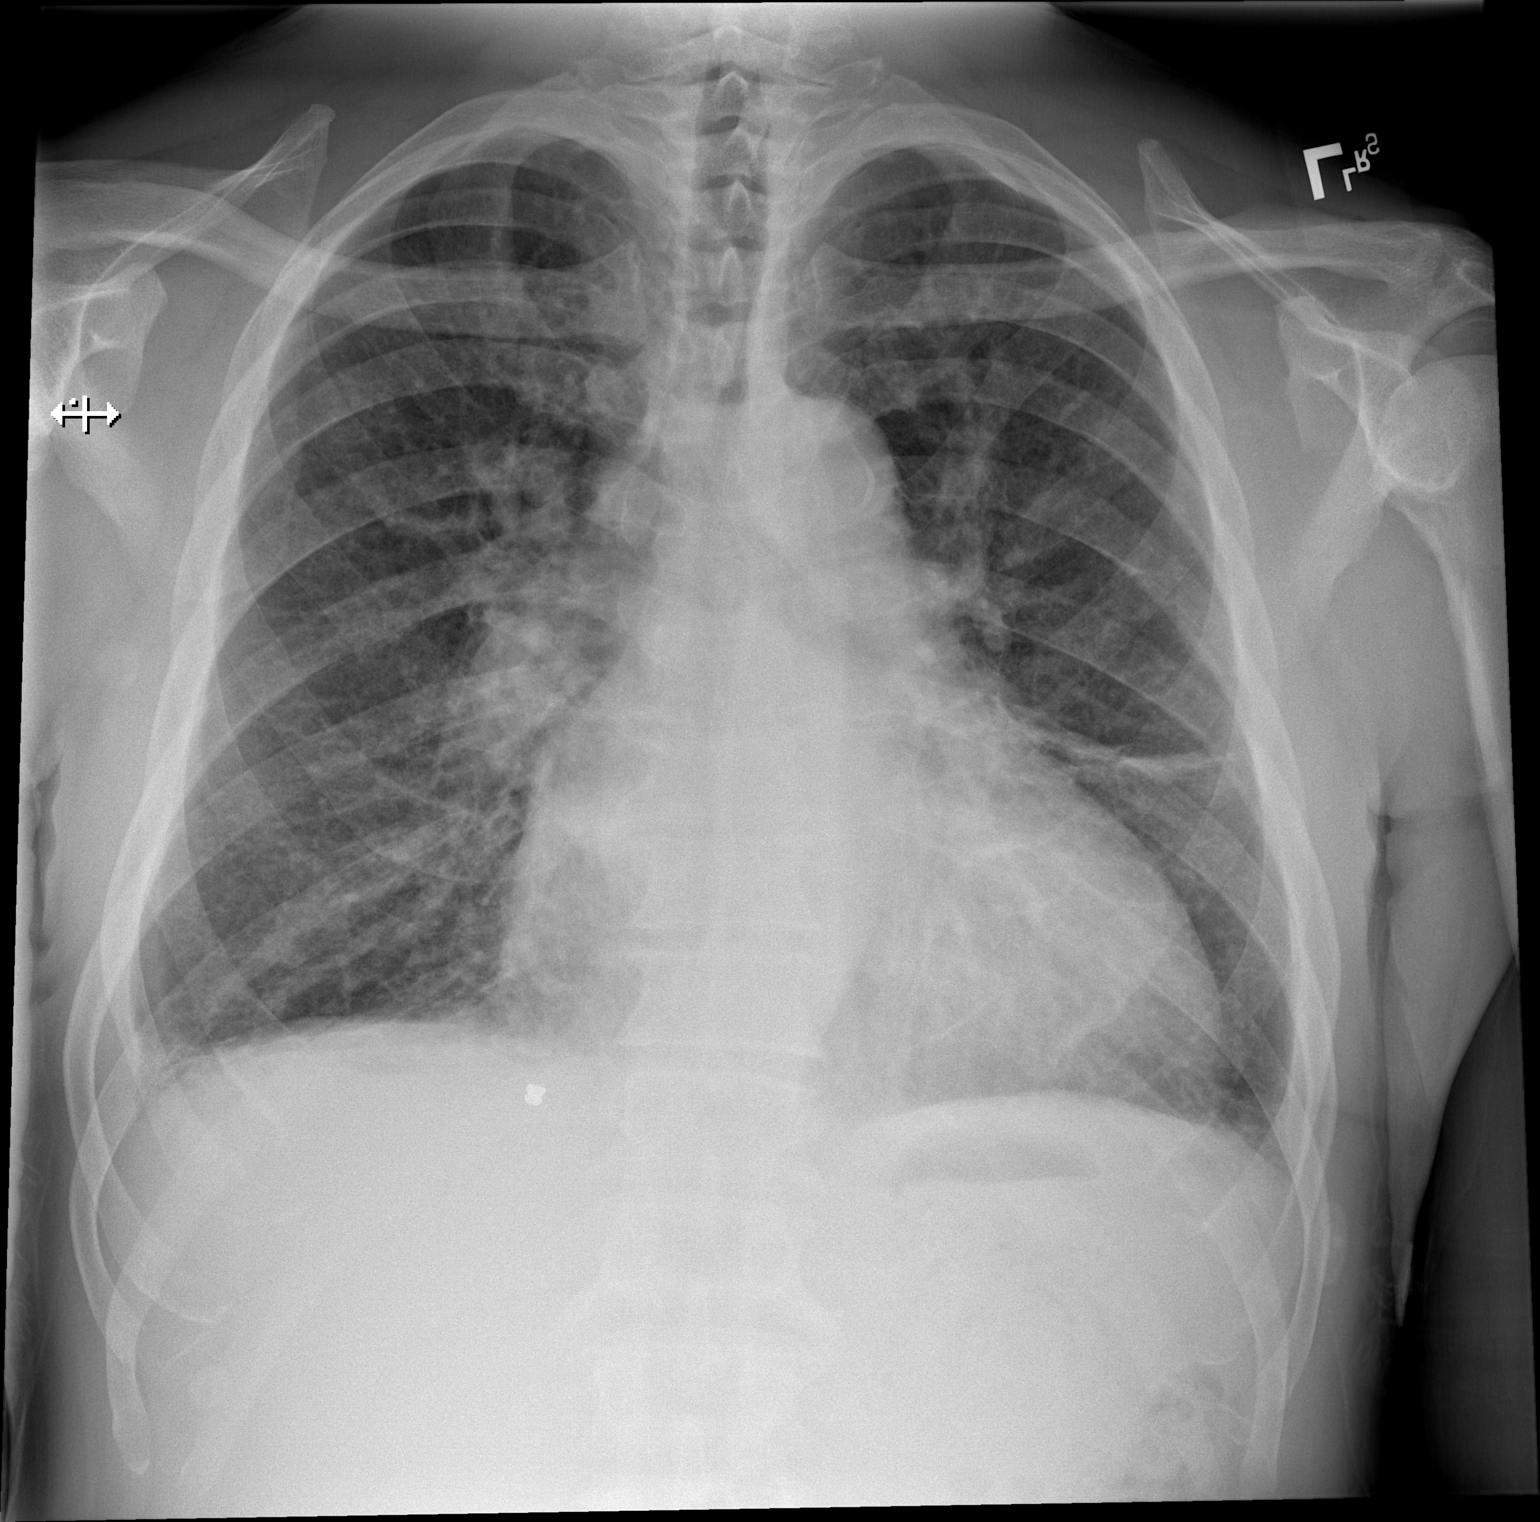

[w chest lat]
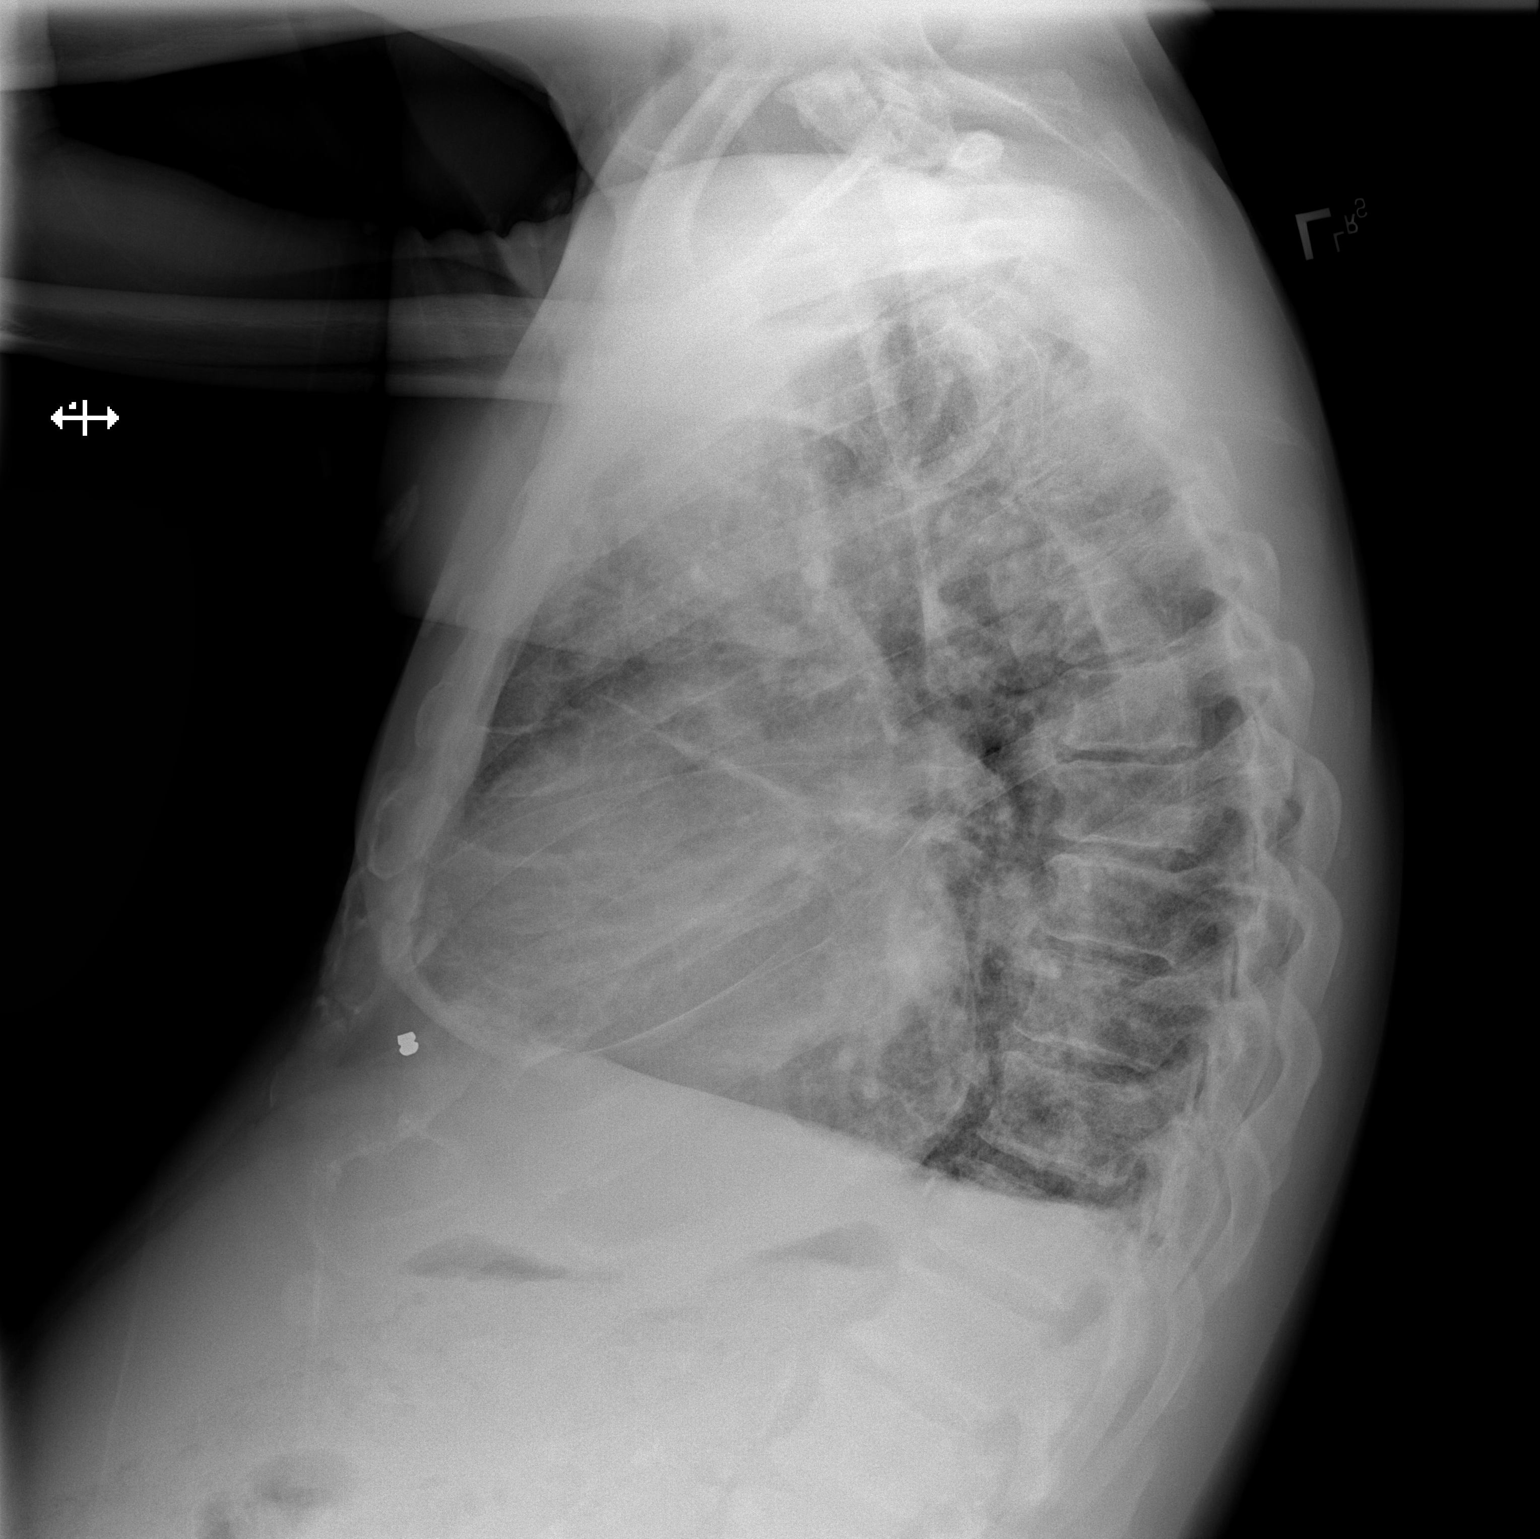

[2 of 2 positions shown; findings below may reference images not displayed]

FINDINGS: Cardiomegaly with vascular congestion. No overt edema. Lingular
atelectasis or scarring. Small right pleural effusion. No left
effusion or acute bony abnormality.
IMPRESSION: Cardiomegaly with vascular congestion.

Lingular scarring or atelectasis.

## 2019-03-07 MED ORDER — POTASSIUM CHLORIDE 20 MEQ/15ML (10%) PO SOLN
60.0000 meq | Freq: Two times a day (BID) | ORAL | Status: AC
  Administered 2019-03-07 (×2): 60 meq
  Filled 2019-03-07: qty 45

## 2019-03-07 MED ORDER — CARVEDILOL 6.25 MG PO TABS
6.2500 mg | ORAL_TABLET | Freq: Two times a day (BID) | ORAL | Status: DC
  Administered 2019-03-07 – 2019-03-09 (×4): 6.25 mg
  Filled 2019-03-07 (×4): qty 1

## 2019-03-07 MED ORDER — LOSARTAN POTASSIUM 25 MG PO TABS: 25.0000 mg | ORAL_TABLET | Freq: Every day | ORAL | Status: DC

## 2019-03-07 MED ORDER — POTASSIUM CHLORIDE 20 MEQ/15ML (10%) PO SOLN: 60.0000 meq | Freq: Two times a day (BID) | ORAL | Status: DC

## 2019-03-07 MED ORDER — POTASSIUM CHLORIDE 20 MEQ/15ML (10%) PO SOLN
60.0000 meq | Freq: Two times a day (BID) | ORAL | Status: DC
  Filled 2019-03-07: qty 45

## 2019-03-07 NOTE — Progress Notes (Signed)
NAME:  [name], MRN:  [id number], DOB:  Sep 20, 1960, LOS: 9 ADMISSION DATE:  13-Nov-2018, CONSULTATION DATE: 03/02/2019 REFERRING MD:  Dr. Erin HearingMauricio [name], CHIEF COMPLAINT:  Post CPR resuscitation; acute hypoxemic respiratory failure  Brief History   58 year old homeless man with  hypertension, polysubstance abuse, HFrEF.  Admitted with mechanical fall secondary to cocaine use. He has a known history of noncompliance with prescribed medications, multiple hospitalizations and documented history of leaving AMA.  On 9/1 patient went into asystolic in-hospital arrest presumed secondary to acute pulmonary edema. ROSC achieved in 14 mins. TTM with rewarming complete on the evening of 9/2.  Past Medical History  Homelessness, polysubstance abuse, cocaine abuse, schizophrenia, sleep apnea, CHF, diabetes, hypertension, chronic foot pain  Significant Hospital Events   8/27- Admitted after mechanical fall 9/1- Asystolic arrest, hypothermia protocol. 9/2 seizure activity on EEG, rewarmed  Consults:  PCCM  Procedures:  OETT 9/1 Left IJ CVL 9/1  Significant Diagnostic Tests:  CT head (9/1): No acute intracranial abnormality  2D echo (11/07/2018): LVEF 25-30%, mildly dilated, moderate concentric LVH.  Diffuse LV hypokinesis.  Diastolic dysfunction.  RV volume and pressure overload.  LA moderately dilated.  RA severely dilated.  Small pericardial effusion.  Urine drug screen 8/28- positive for cocaine  CT angiogram chest 9/1 no pulmonary embolism, moderate left more than right effusions, anasarca  EEG 9/1  Intermittent polyspikes were seen, either generalized or in right frontocentral region epileptogenicity which is likely generalized or arising from right frontocentral region.  No clear seizures were seen   Echo 9/1 EF 35-40%, RVSP 38 mm LTM EEG 9/2 >> (on versed )  generalized spikes with overriding fast activity at 1-1.5Hz . No clear seizure  MRI brain 9/4- diffuse cerebral anoxic  injury  CXR 9/4- b/l layering pleural effusions  Micro Data:  SARS-CoV-2 8/28-negative.  He has at least 20 negative tests since May 2020  resp cx 9/1 >> nml flora, final bld 9/1 >> NG final  Antimicrobials:  Vancomycin 9/1 > 9/3 Zosyn 9/1 >  Interim history/subjective:  [name] remains critically ill, intubated.  Overnight no acute events.  Hypertensive requiring PRN's. PRN versed given, and infusion turned to 5mg /h.  Objective   Blood pressure 123/64, pulse 90, temperature 98.6 F (37 C), resp. rate 16, height 6' (1.829 m), weight 101.6 kg, SpO2 100 %. CVP:  [5 mmHg-13 mmHg] 5 mmHg  Vent Mode: PRVC FiO2 (%):  [40 %-50 %] 40 % Set Rate:  [10 bmp] 10 bmp Vt Set:  [620 mL] 620 mL PEEP:  [5 cmH20] 5 cmH20 Plateau Pressure:  [14 cmH20-18 cmH20] 14 cmH20   Intake/Output Summary (Last 24 hours) at 03/07/2019 1017 Last data filed at 03/07/2019 0700 Gross per 24 hour  Intake 822.86 ml  Output 6150 ml  Net -5327.14 ml  Net for admission -25.7 L Filed Weights   03/05/19 0500 03/06/19 0500 03/07/19 0100  Weight: 112 kg 109.7 kg 101.6 kg    Examination: Gen:   Elderly man lying in bed in no acute distress.  RASS -5 HEENT: Normocephalic, atraumatic.  Anicteric sclera. Neck:    Left IJ CVC. Lungs:    Clear to auscultation bilaterally.  Moderate, thin secretions from ET tube.  Breathing above the vent. CV:      Regular rate and rhythm, no murmurs. Abd:      Cough, nontender, nondistended Ext: Persistent brawny edema, but improved from previous exams.  No cyanosis or clubbing. GU:  Foley catheter in place Skin:  Warm, dry, no rashes Neuro:    Exam performed on versed infusion 5mg /h, +PERRL + corneal, + gag & cough. No withdraw from any extremities. Grimace with gag.  Resolved Hospital Problem list    Oozing from IJ line Thrombin pad  Assessment & Plan:  Briefly this is a 58 year old with history of CHF, cocaine abuse transferred to ICU after asystolic cardiac arrest   Cardiac arrest- asystole.  Presumed secondary to acute on chronic HFrEF in the setting of cocaine use, acute pulm edema. Chronic systolic heart failure. Previously requiring vasopressors.  Negative fluid balance, but still significantly volume overloaded. -continue diuresis -supportive care- maintain nomothermia, eucapnia, normoglycemia, HOB >30 degrees -Palliative care meeting with family today at 68 with son. Mom planning on coming from [place] today.  -Increase coreg to 6.25mg  BID  Acute hypoxic respiratory failure following cardiac arrest.  Acute pulmonary edema.  Bilateral pleural effusions. ABG shows chronic compensated hypercapnia. -Continue full vent support.  Tolerated a trial of PS 12 for about 2 hours before failing. -VAP prevention protocol - Continue SAT and SBT daily.  Neuro status precludes extubation.  Hypertension -Increasing Coreg -If he remains hypertensive will restart ARB tomorrow -PRN hydralazine for SBP greater than 180  Wound at base of penis -does not appear infected -Patient wound care's assistance  Leukocytosis-suspect this was a stress response.  Now resolved. -Continue to monitor off antibiotics  Diabetes mellitus- appropriate BGs currently -Continue sliding scale insulin PRN -Goal BG 140-180 while admitted to the ICU  Acute encephalopathy post- arrest with severe anoxic brain injury on MRI and seizures. MRI confirms diffuse injury. EEG demonstrating seizures, which portends a very poor prognosis. -Continue Keppra, valproate, Tegretol, Versed infusion to suppress seizures.  -Ongoing discussions with the patient's family regarding his prognosis.  Very much appreciate palliative care's assistance. -Discussed with neurology his physical exam.  Given his diagnostic testing, his prognosis remains very poor.  AKI post- arrest, recovered - We will restart ARB if blood pressure tolerates Continue to monitor intake and output record -Avoid nephrotoxic meds   Tobacco abuse -d/c nicotine patch  Very poor neurologic prognosis.   Best practice:  Diet: NPO , TFs Pain/Anxiety/Delirium protocol (if indicated): Ordered VAP protocol (if indicated): Ordered DVT prophylaxis: Hep SQ GI prophylaxis: PPI Glucose control: SSI Mobility: Bed Code Status: Full Family Communication: Ongoing family discussions- family meeting this morning. Disposition: ICU  This patient is critically ill with multiple organ system failure which requires frequent high complexity decision making, assessment, support, evaluation, and titration of therapies. This was completed through the application of advanced monitoring technologies and extensive interpretation of multiple databases. During this encounter critical care time was devoted to patient care services described in this note for 40 minutes.  Julian Hy, DO 03/07/19 10:46 AM [facility]

## 2019-03-07 NOTE — Consult Note (Addendum)
Consultation Note Date: 03/07/2019   Patient Name: [name]  DOB: Feb 20, 1961  MRN: [id number]  Age / Sex: 58 y.o., male  PCP: Patient, No Pcp Per Referring Physician: Julian Hy, DO  Reason for Consultation: Establishing goals of care and Psychosocial/spiritual support  HPI/Patient Profile: 58 y.o. male   admitted on 01/30/2019 with significant past medical history significant of chronic combined CHF; OSA; schizophrenia; polysubstance abuse; HTN; homelessness; hep C; and DM presenting with worsening edema.  He is frequently seen in the ER and sometimes admitted ( 24 admission and 90 ER visits in last six months)  He continues to use cocaine and does not take any of his outpatient medications.He presented after using cocaine and falling overnight.    He was admitted from 8/23-25 for recurrent CHF exacerbation and left AMA; he was seen by cardiology and noted to have anasarca with EF 25-30%.   Psychiatry also saw him and recommended home Tegretol and Abilify, but he was noted to have capacity to make decisions.  He left AMA, returned 8/25 "so that he would not get arrested".  He returned 8/26 with chronic anasarca and was stable for d/c He was noted to then be at the bus stop on 8/27 AM "covered in feces asleep" and was brought in for a shower and clean clothes and given a taxi voucher.   He returned to the ER about 2 hours later after falling in a parking lot.  On 9/1 patient became asystolic and had an inpatient arrest.  TTM with rewarming completed on evening of 9/2.  MRI of the brain on 9/4 significant for diffuse cerebral anoxic injury.  Patient currently intubated and unresponsive with very poor prognosis.  Family face treatment option decisions, advanced directive decisions, and end-of-life wishes.   Clinical Assessment and Goals of Care:  This NP Wadie Lessen reviewed medical records,  received report from team, assessed the patient and then meet at the patient's bedside along with his son/ Phoebe Perch  to discuss diagnosis, prognosis, GOC, EOL wishes disposition and options.  Both patient's biological parents are living.  I spoke to both by telephone.  Because there is no designated healthcare power of attorney it is important to delineate decision-makers for this patient.  At this time the majority of patients reasonably available parents and adult children are decision makers for this patient.    Biological father is Ellison Carwin [phone]), patient  has two half brother on his father's side Regino Schultze and Carolos. ( both of theses men have no significant relationship with the  Patient)  --- I spoke to father/Charles Aline Brochure and discussed diagnosis, prognosis, goals of care, end-of-life wishes, and options.  Mr. Aline Brochure clearly verbalizes his understanding of the patient's situation and he desires his main focus of care to be comfort, quality and dignity.  He supports de-escalation of care, liberation from the vent and allowing a natural death once the patient's mother is able to see him.   Biological mother is Layla Barter [phone]).  Patient was  not raised by his biological mother, however the woman who raised him "adopted" is deceased.  Dorene Sorrow lives in [place] and is currently driving here to [place] to see her son.  He has 2 biological sons living here in [place], Tiger and Wetzel Bjornstad.  Both Nicole Kindred and Annie Main only came to know their father in the last 2 to 3 years.   Nicole Kindred and his wife have been very involved over these last few years trying to help Mr. Caster as best they could and a very dense psychosocial environment.  To work verbalizes a clear understanding of the patient's serious situation and he to desires the main focus of care to be comfort, quality and dignity.  He supports de-escalation of care, liberation from the vent  and allowing a natural death once the patient's mother is able to see him.  Concept of  Palliative Care was discussed with all of the above persons.  MOST form was introduced  A  discussion was had today regarding advanced directives.  Concepts specific to code status, artifical feeding and hydration, continued IV antibiotics and rehospitalization was had.  The difference between a aggressive medical intervention path  and a palliative comfort care path for this patient at this time was had.  Values and goals of care important to patient and family were attempted to be elicited.  This nurse practitioner will follow-up in the morning with the patient's mother and 2 sons who live local to further clarify plan of care.  Questions and concerns addressed.   Family encouraged to call with questions or concerns.    PMT will continue to support holistically.  SUMMARY OF RECOMMENDATIONS    Code Status/Advance Care Planning:  Full code   Continue current treatment plan until patient's mother is able to visit and weigh in on her wishes/hopes for her son.   This nurse practitioner will follow-up again in the morning with family and help navigate next transitions of care.   Psycho-social/Spiritual:   Desire for further Chaplaincy support:yes  Emotional support offered to all persons of communication today.  Created space and opportunity for family to explore the thoughts and feelings regarding current medical situation.     Primary Diagnoses: Present on Admission:  Acute on chronic combined systolic (congestive) and diastolic (congestive) heart failure (HCC)  Cocaine abuse with cocaine-induced mood disorder (HCC)  HLD (hyperlipidemia)  Tobacco use  Essential hypertension  Acute on chronic respiratory failure with hypoxia (HCC)  Acute pulmonary edema (HCC)  Shock ([place])  Abnormal EKG  Anoxic encephalopathy ([place])   I have reviewed the medical record, interviewed the patient and  family, and examined the patient. The following aspects are pertinent.  Past Medical History:  Diagnosis Date   Chronic foot pain    Cocaine abuse ([place])    Diabetes mellitus without complication ([place])    Hepatitis C    unsure    Homelessness    Hypertension    Neuropathy    Polysubstance abuse ([place])    Schizophrenia (HCC)    Sleep apnea    Systolic and diastolic CHF, chronic (HCC)    Social History   Socioeconomic History   Marital status: Single    Spouse name: Not on file   Number of children: Not on file   Years of education: Not on file   Highest education level: Not on file  Occupational History   Not on file  Social Needs   Financial resource strain: Not on file  Food insecurity    Worry: Not on file    Inability: Not on file   Transportation needs    Medical: Not on file    Non-medical: Not on file  Tobacco Use   Smoking status: Current Every Day Smoker    Packs/day: 1.00    Years: 20.00    Pack years: 20.00    Types: Cigarettes   Smokeless tobacco: Current User  Substance and Sexual Activity   Alcohol use: Yes    Comment: Daily Drinker    Drug use: Yes    Frequency: 7.0 times per week    Types: "Crack" cocaine, Cocaine, Marijuana    Comment: Crack   Sexual activity: Not Currently  Lifestyle   Physical activity    Days per week: Not on file    Minutes per session: Not on file   Stress: Not on file  Relationships   Social connections    Talks on phone: Not on file    Gets together: Not on file    Attends religious service: Not on file    Active member of club or organization: Not on file    Attends meetings of clubs or organizations: Not on file    Relationship status: Not on file  Other Topics Concern   Not on file  Social History Narrative   ** Merged History Encounter **       Family History  Problem Relation Age of Onset   Hypertension Other    Diabetes Other    Scheduled Meds:  artificial tears  1  application Both Eyes M2U   aspirin  81 mg Per Tube Daily   carbamazepine  100 mg Per Tube BID   carvedilol  3.125 mg Per Tube BID WC   chlorhexidine gluconate (MEDLINE KIT)  15 mL Mouth Rinse BID   Chlorhexidine Gluconate Cloth  6 each Topical Daily   feeding supplement (PRO-STAT SUGAR FREE 64)  60 mL Per Tube BID   furosemide  60 mg Intravenous BID   heparin  5,000 Units Subcutaneous Q8H   insulin aspart  0-15 Units Subcutaneous Q4H   mouth rinse  15 mL Mouth Rinse 10 times per day   nicotine  14 mg Transdermal Daily   pantoprazole (PROTONIX) IV  40 mg Intravenous Daily   sodium chloride flush  10-40 mL Intracatheter Q12H   sodium chloride flush  3 mL Intravenous Q12H   Continuous Infusions:  sodium chloride     sodium chloride Stopped (03/06/19 0230)   sodium chloride Stopped (03/02/19 1605)   feeding supplement (VITAL HIGH PROTEIN) 1,000 mL (03/05/19 1130)   levETIRAcetam Stopped (03/07/19 0033)   midazolam 10 mg/hr (03/07/19 0700)   norepinephrine (LEVOPHED) Adult infusion Stopped (03/04/19 2228)   valproate sodium Stopped (03/07/19 0602)   PRN Meds:.sodium chloride, sodium chloride, acetaminophen, fentaNYL (SUBLIMAZE) injection, hydrALAZINE, magnesium hydroxide, midazolam, ondansetron (ZOFRAN) IV, simethicone, sodium chloride flush, sodium chloride flush Medications Prior to Admission:  Prior to Admission medications   Medication Sig Start Date End Date Taking? Authorizing Provider  acetaminophen (TYLENOL) 325 MG tablet Take 2 tablets (650 mg total) by mouth every 8 (eight) hours as needed. 02/04/2019  Yes Virgel Manifold, MD  furosemide (LASIX) 40 MG tablet Take 1 tablet (40 mg total) by mouth 2 (two) times daily. 02/03/2019 03/27/19 Yes Virgel Manifold, MD  glipiZIDE (GLUCOTROL) 5 MG tablet Take 0.5 tablets (2.5 mg total) by mouth 2 (two) times daily before a meal. 02/12/2019  Yes Virgel Manifold, MD  Lidocaine,  Anorectal, 5 % CREA Apply to sore on penis as needed  for pain. 02/20/19  Yes Molpus, John, MD   Allergies  Allergen Reactions   Haldol [Haloperidol] Other (See Comments)    Muscle spasms, loss of voluntary movement. However, pt has taken Thorazine on multiple occasions with no adverse effects.    Review of Systems  Unable to perform ROS: Acuity of condition    Physical Exam Constitutional:      Appearance: He is normal weight. He is ill-appearing.     Interventions: He is intubated.  Cardiovascular:     Rate and Rhythm: Normal rate.  Pulmonary:     Effort: He is intubated.  Skin:    General: Skin is warm and dry.     Vital Signs: BP 135/78    Pulse 90    Temp 98.6 F (37 C)    Resp 14    Ht 6' (1.829 m)    Wt 101.6 kg    SpO2 98%    BMI 30.38 kg/m  Pain Scale: CPOT POSS *See Group Information*: 1-Acceptable,Awake and alert Pain Score: Asleep   SpO2: SpO2: 98 % O2 Device:SpO2: 98 % O2 Flow Rate: .O2 Flow Rate (L/min): 2 L/min  IO: Intake/output summary:   Intake/Output Summary (Last 24 hours) at 03/07/2019 0709 Last data filed at 03/07/2019 0700 Gross per 24 hour  Intake 1014.42 ml  Output 7750 ml  Net -6735.58 ml    LBM: Last BM Date: 03/01/19 Baseline Weight: Weight: 120 kg Most recent weight: Weight: 101.6 kg     Palliative Assessment/Data:   Discussed with Dr.  Carlis Abbott and bedside RN  Time In: 1030 Time Out: 1200 Time Total: 90 minutes Greater than 50%  of this time was spent counseling and coordinating care related to the above assessment and plan.  Signed by: Wadie Lessen, NP   Please contact Palliative Medicine Team phone at [phone] for questions and concerns.  For individual provider: See Shea Evans

## 2019-03-07 NOTE — Progress Notes (Signed)
Subjective: Continues to be comatose  Exam: Vitals:   03/07/19 1938 03/07/19 2000  BP:  129/79  Pulse: 94 97  Resp: 18 15  Temp:  99.7 F (37.6 C)  SpO2: 93% 97%   Gen: In bed, NAD Resp: non-labored breathing, no acute distress Abd: soft, nt  Neuro: MS: Does not open eyes or follow commandsDoes not open eyes or follow commands PY:KDXIP, eyes move with dolls, but slightly dysconjugate,  Motor: extensor to noxious stimulation in left arm, flexor on right, flexion vs triple flexion bilateral legs.  Sensory:as above.   Pertinent Studies: Initial EEG -completely suppressed with the exception of occasional generalized epileptiform discharges  MRI brain- extensive diffuse cortical infarct consistent with hypoxic ischemic brain injury    Impression: Based on the patient's EEG, MRI and clinical exam, I do not feel that he has any significant chance of recovery to independent function.  I continue to recommend palliative care discussions.   Recommendations: 1) Neurology will be available on an as needed basis. Please call with questions.   Roland Rack, MD [facility] [phone]  If 7pm- 7am, please page neurology on call as listed in [place].

## 2019-03-07 NOTE — Progress Notes (Signed)
Assisted tele visit to patient with family member.  Garnie Borchardt M, RN  

## 2019-03-08 ENCOUNTER — Inpatient Hospital Stay (HOSPITAL_COMMUNITY): Payer: Medicare Other

## 2019-03-08 DIAGNOSIS — Z515 Encounter for palliative care: Secondary | ICD-10-CM

## 2019-03-08 LAB — POCT I-STAT 7, (LYTES, BLD GAS, ICA,H+H)
Acid-Base Excess: 9 mmol/L — ABNORMAL HIGH (ref 0.0–2.0)
Bicarbonate: 32.9 mmol/L — ABNORMAL HIGH (ref 20.0–28.0)
Calcium, Ion: 1.33 mmol/L (ref 1.15–1.40)
HCT: 29 % — ABNORMAL LOW (ref 39.0–52.0)
Hemoglobin: 9.9 g/dL — ABNORMAL LOW (ref 13.0–17.0)
O2 Saturation: 93 %
Patient temperature: 37.4
Potassium: 3.8 mmol/L (ref 3.5–5.1)
Sodium: 148 mmol/L — ABNORMAL HIGH (ref 135–145)
TCO2: 34 mmol/L — ABNORMAL HIGH (ref 22–32)
pCO2 arterial: 43.9 mmHg (ref 32.0–48.0)
pH, Arterial: 7.484 — ABNORMAL HIGH (ref 7.350–7.450)
pO2, Arterial: 63 mmHg — ABNORMAL LOW (ref 83.0–108.0)

## 2019-03-08 LAB — CBC
HCT: 29.1 % — ABNORMAL LOW (ref 39.0–52.0)
Hemoglobin: 8.3 g/dL — ABNORMAL LOW (ref 13.0–17.0)
MCH: 21.9 pg — ABNORMAL LOW (ref 26.0–34.0)
MCHC: 28.5 g/dL — ABNORMAL LOW (ref 30.0–36.0)
MCV: 76.8 fL — ABNORMAL LOW (ref 80.0–100.0)
Platelets: 197 10*3/uL (ref 150–400)
RBC: 3.79 MIL/uL — ABNORMAL LOW (ref 4.22–5.81)
RDW: 26 % — ABNORMAL HIGH (ref 11.5–15.5)
WBC: 7.2 10*3/uL (ref 4.0–10.5)
nRBC: 0.3 % — ABNORMAL HIGH (ref 0.0–0.2)

## 2019-03-08 LAB — BASIC METABOLIC PANEL
Anion gap: 8 (ref 5–15)
BUN: 34 mg/dL — ABNORMAL HIGH (ref 6–20)
CO2: 29 mmol/L (ref 22–32)
Calcium: 9.1 mg/dL (ref 8.9–10.3)
Chloride: 107 mmol/L (ref 98–111)
Creatinine, Ser: 0.88 mg/dL (ref 0.61–1.24)
GFR calc Af Amer: >60 mL/min (ref >60)
GFR calc non Af Amer: >60 mL/min (ref >60)
Glucose, Bld: 109 mg/dL — ABNORMAL HIGH (ref 70–99)
Potassium: 3.7 mmol/L (ref 3.5–5.1)
Sodium: 144 mmol/L (ref 135–145)

## 2019-03-08 LAB — GLUCOSE, CAPILLARY
Glucose-Capillary: 108 mg/dL — ABNORMAL HIGH (ref 70–99)
Glucose-Capillary: 152 mg/dL — ABNORMAL HIGH (ref 70–99)

## 2019-03-08 MED ORDER — HYDRALAZINE HCL 20 MG/ML IJ SOLN
INTRAMUSCULAR | Status: AC
  Filled 2019-03-08: qty 1

## 2019-03-08 MED ORDER — LOSARTAN POTASSIUM 25 MG PO TABS
25.0000 mg | ORAL_TABLET | Freq: Every day | ORAL | Status: DC
  Administered 2019-03-09: 10:00:00 25 mg
  Filled 2019-03-08: qty 1

## 2019-03-08 MED ORDER — HYDRALAZINE HCL 20 MG/ML IJ SOLN
10.0000 mg | INTRAMUSCULAR | Status: DC | PRN
  Administered 2019-03-08 – 2019-03-09 (×3): 10 mg via INTRAVENOUS
  Filled 2019-03-08 (×2): qty 1

## 2019-03-08 MED ORDER — ACETAMINOPHEN 160 MG/5ML PO SOLN
650.0000 mg | ORAL | Status: DC | PRN
  Administered 2019-03-08 – 2019-03-09 (×2): 650 mg
  Filled 2019-03-08 (×2): qty 20.3

## 2019-03-08 MED ORDER — NICARDIPINE HCL IN NACL 20-0.86 MG/200ML-% IV SOLN
3.0000 mg/h | INTRAVENOUS | Status: DC
  Administered 2019-03-08: 5 mg/h via INTRAVENOUS
  Filled 2019-03-08: qty 200

## 2019-03-08 MED ORDER — POTASSIUM CHLORIDE 20 MEQ/15ML (10%) PO SOLN
80.0000 meq | Freq: Two times a day (BID) | ORAL | Status: AC
  Administered 2019-03-08 (×2): 80 meq
  Filled 2019-03-08 (×2): qty 60

## 2019-03-08 MED ORDER — NICARDIPINE HCL IN NACL 40-0.83 MG/200ML-% IV SOLN
3.0000 mg/h | INTRAVENOUS | Status: DC
  Administered 2019-03-09: 5 mg/h via INTRAVENOUS
  Administered 2019-03-09: 3 mg/h via INTRAVENOUS
  Filled 2019-03-08 (×3): qty 200

## 2019-03-08 NOTE — Progress Notes (Signed)
Assisted tele visit to patient with family member.  Jennine Peddy R, RN  

## 2019-03-08 NOTE — Progress Notes (Signed)
eLink Physician-Brief Progress Note Patient Name: [name] DOB: December 11, 1960 MRN: [id number]   Date of Service  03/08/2019  HPI/Events of Note  Hypertension - BP = 172/68  eICU Interventions  Will order: 1. Hydralazine 10 mg IV Q 4 hours PRN SBP > 160 or DBP > 100.      Intervention Category Major Interventions: Hypertension - evaluation and management  [name] 03/08/2019, 2:44 AM

## 2019-03-08 NOTE — Progress Notes (Addendum)
Patient ID: [name], male   DOB: 1961-01-06, 58 y.o.   MRN: [id number]  This NP visited patient at the bedside as a follow up to  yesterday's [place].  Patient's mother has not arrived to the bedside as of this point in time (1400).  She verbalized to me that she would be arriving from [place] last night around 6 PM and that she was coming to the bedside, again that has not happened.  I have attempted to phone [name]/the mother multiple times without success and her voicemail is not set up so I am unable to leave a message.  I spoke again to [name], continued conversation regarding current medical situation.  The patient's father and [name] are all in agreement for liberation from the ventilator and allowing a natural death knowing the patient's grim prognosis.  Everyone would like to give the patient's mother the opportunity to see him before his death.  We will wait and hope that she arrive sometime today.  This nurse practitioner will contact the patient's son [name] again in the morning for further clarification of goals of care  and treatment plan.  Questions and concerns addressed   Discussed with bedside RN  Total time spent on the unit was 25 minutes  Greater than 50% of the time was spent in counseling and coordination of care  Wadie Lessen NP  Palliative Medicine Team Team Phone # [phone] Pager [phone]  Addendum  (1830 pm)  Mother arrived from out of town and is  at bedside for visit.  I spoke to her by phone and updated her on the medical situation as well as plan to liberate him from from the ventilator allowing for a natural death as discussed with three other decision makers ( father and two sons).   She understands and will trust in "God' plan" for her son. Plan is to meet tomorrow at 1130 for one way extubation and shift  treatment plan to  focus on comfort and dignity We discussed the likely short  term prognosis.  Questions and concerns addressed, emotional support offered.

## 2019-03-08 NOTE — Progress Notes (Signed)
eLink Physician-Brief Progress Note Patient Name: [name] DOB: 02-Jun-1961 MRN: [id number]   Date of Service  03/08/2019  HPI/Events of Note  Hypertension - BP = 212/84 by A-line. Currently on Hydralazine IV PRN and 2. Fluttering of eyelids - Seizure vs anoxic myoclonus. Presently on Versed IV infusion, Valproate, Keppra and Tegretol. Followed by neurology. Defer management of eye fluttering to Neurology.   eICU Interventions  Will order: 1. Nicardipine IV infusion. Titrate to SBP < 160.     Intervention Category Major Interventions: Hypertension - evaluation and management;Seizures - evaluation and management  Lysle Dingwall 03/08/2019, 10:31 PM

## 2019-03-08 NOTE — Progress Notes (Signed)
Per shift report, pt's mother came in to visit around 1800 today.  Pt's father called at 2100 and said that tomorrow is the pt's twin sons' birthday. Pt's sons are planning to visit tomorrow but would like to hold off on withdrawal of care until Wednesday.  Nelva Bush RN

## 2019-03-08 NOTE — Progress Notes (Signed)
NAME:  Taylor MemosFrederick L [name], MRN:  [id number], DOB:  Feb 21, 1961, LOS: 10 ADMISSION DATE:  02/13/2019, CONSULTATION DATE: 03/02/2019 REFERRING MD:  Dr. Erin HearingMauricio Arrien, CHIEF COMPLAINT:  Post CPR resuscitation; acute hypoxemic respiratory failure  Brief History   58 year old homeless man with  hypertension, polysubstance abuse, HFrEF.  Admitted with mechanical fall secondary to cocaine use. He has a known history of noncompliance with prescribed medications, multiple hospitalizations and documented history of leaving AMA.  On 9/1 patient went into asystolic in-hospital arrest presumed secondary to acute pulmonary edema. ROSC achieved in 14 mins. TTM with rewarming complete on the evening of 9/2.  Past Medical History  Homelessness, polysubstance abuse, cocaine abuse, schizophrenia, sleep apnea, CHF, diabetes, hypertension, chronic foot pain  Significant Hospital Events   8/27- Admitted after mechanical fall 9/1- Asystolic arrest, hypothermia protocol. 9/2 seizure activity on EEG, rewarmed  Consults:  PCCM  Procedures:  OETT 9/1 Left IJ CVL 9/1  Significant Diagnostic Tests:  CT head (9/1): No acute intracranial abnormality  2D echo (11/07/2018): LVEF 25-30%, mildly dilated, moderate concentric LVH.  Diffuse LV hypokinesis.  Diastolic dysfunction.  RV volume and pressure overload.  LA moderately dilated.  RA severely dilated.  Small pericardial effusion.  Urine drug screen 8/28- positive for cocaine  CT angiogram chest 9/1 no pulmonary embolism, moderate left more than right effusions, anasarca  EEG 9/1  Intermittent polyspikes were seen, either generalized or in right frontocentral region epileptogenicity which is likely generalized or arising from right frontocentral region.  No clear seizures were seen   Echo 9/1 EF 35-40%, RVSP 38 mm LTM EEG 9/2 >> (on versed )  generalized spikes with overriding fast activity at 1-1.5Hz . No clear seizure  MRI brain 9/4- diffuse cerebral  anoxic injury  CXR 9/4- b/l layering pleural effusions  Micro Data:  SARS-CoV-2 8/28-negative.  He has at least 20 negative tests since May 2020  resp cx 9/1 >> nml flora, final bld 9/1 >> NG final  Antimicrobials:  Vancomycin 9/1 > 9/3 Zosyn 9/1 >  Interim history/subjective:  Hydralazine added PRN for hypertension overnight. His mother has not been to the hospital as of yet.  Ongoing goals of care discussions between palliative care, myself, and the family.  Updated his son Roseanne RenoStewart and his wife at bedside yesterday.  Objective   Blood pressure (!) 153/83, pulse 95, temperature 99 F (37.2 C), resp. rate 16, height 6' (1.829 m), weight 94.7 kg, SpO2 99 %. CVP:  [11 mmHg-17 mmHg] 15 mmHg  Vent Mode: CPAP;PSV FiO2 (%):  [40 %] 40 % Set Rate:  [10 bmp] 10 bmp Vt Set:  [620 mL] 620 mL PEEP:  [5 cmH20] 5 cmH20 Pressure Support:  [10 cmH20] 10 cmH20 Plateau Pressure:  [14 cmH20-20 cmH20] 16 cmH20   Intake/Output Summary (Last 24 hours) at 03/08/2019 0851 Last data filed at 03/08/2019 0800 Gross per 24 hour  Intake 1458.38 ml  Output 4400 ml  Net -2941.62 ml   Filed Weights   03/06/19 0500 03/07/19 0100 03/08/19 0500  Weight: 109.7 kg 101.6 kg 94.7 kg    Examination: Gen:   Elderly man lying in bed in no acute distress.  Nonresponsive.   HEENT: normocephalic, atraumatic, anicteric sclera.  No scleral edema. Neck:   LIJ CVC without erythema Lungs:    Clear to auscultation bilaterally.  Minimal thin secretions from ET tube. Tolerating pressure support 10 mmHg.  Positive cuff leak test. CV:      Regular rate and rhythm, no murmurs  Abd:   Soft, nontender, nondistended Ext: Persistent brawny edema, improved compared to previous exams. GU:  Foley in place, penile ulcer bandaged. Skin:    Warm, dry, no rashes or wounds. Neuro:   Exam performed on Versed infusion, positive pupils, negative corneals, negative coughing gag.  No withdraw to pain or response to verbal stimulation.   Resolved Hospital Problem list    Oozing from IJ line Thrombin pad  Assessment & Plan:  Briefly this is a 58 year old with history of CHF, cocaine abuse transferred to ICU after asystolic cardiac arrest  Cardiac arrest- asystole.  Presumed secondary to acute on chronic HFrEF in the setting of cocaine use, acute pulm edema. Chronic systolic heart failure. Previously requiring vasopressors.  Negative fluid balance, but still significantly volume overloaded. -Continue diuresis to maintain euvolemia -Continue supportive care measures for now.  Family leaning towards focusing on comfort based measures.   -Continue Coreg 6.25 twice daily - His mother should be coming to bedside today.  Anticipate ongoing family discussions with likely transition to comfort based measures  Acute hypoxic respiratory failure following cardiac arrest.  Acute pulmonary edema.  Bilateral pleural effusions. ABG shows chronic compensated hypercapnia. -Continue full vent support for now -Prevention protocol - Continue daily SAT and SBT as tolerated.  Until the family comes to a decision, his neuro status currently precludes extubation.  Hypertension -Continue Coreg -Adding losartan 25 mg daily -PRN hydralazine for SBP greater than 180  Wound at base of penis -does not appear infected -Appreciate wound care's assistance  Diabetes mellitus- appropriate BGs currently -Continue sliding scale insulin PRN -Goal blood glucose 140-180 while admitted to the ICU  Acute encephalopathy post- arrest with severe anoxic brain injury on MRI and seizures. MRI confirms diffuse injury. EEG demonstrating seizures, which portends a very poor prognosis. -Continue Keppra, valproate, Tegretol, Versed -Ongoing discussions with the family regarding goals of care  AKI post- arrest, recovered -Continue to monitor I/O -Avoid nephrotoxic meds; will monitor on ARB  Tobacco abuse -d/c nicotine patch  His prognosis remains poor.  Best  practice:  Diet: NPO , TFs Pain/Anxiety/Delirium protocol (if indicated): Ordered VAP protocol (if indicated): Ordered DVT prophylaxis: Hep SQ GI prophylaxis: PPI Glucose control: SSI Mobility: Bed Code Status: Full Family Communication: Ongoing family discussions- family meeting again today.   Disposition: ICU  This patient is critically ill with multiple organ system failure which requires frequent high complexity decision making, assessment, support, evaluation, and titration of therapies. This was completed through the application of advanced monitoring technologies and extensive interpretation of multiple databases. During this encounter critical care time was devoted to patient care services described in this note for 35 minutes.  Julian Hy, DO 03/08/19 9:07 AM [facility]

## 2019-03-09 DIAGNOSIS — R0609 Other forms of dyspnea: Secondary | ICD-10-CM

## 2019-03-09 DIAGNOSIS — Z66 Do not resuscitate: Secondary | ICD-10-CM | POA: Insufficient documentation

## 2019-03-09 DIAGNOSIS — R0600 Dyspnea, unspecified: Secondary | ICD-10-CM | POA: Insufficient documentation

## 2019-03-09 LAB — BASIC METABOLIC PANEL
Anion gap: 9 (ref 5–15)
BUN: 35 mg/dL — ABNORMAL HIGH (ref 6–20)
CO2: 30 mmol/L (ref 22–32)
Calcium: 9.4 mg/dL (ref 8.9–10.3)
Chloride: 112 mmol/L — ABNORMAL HIGH (ref 98–111)
Creatinine, Ser: 1.09 mg/dL (ref 0.61–1.24)
GFR calc Af Amer: >60 mL/min (ref >60)
GFR calc non Af Amer: >60 mL/min (ref >60)
Glucose, Bld: 132 mg/dL — ABNORMAL HIGH (ref 70–99)
Potassium: 3.9 mmol/L (ref 3.5–5.1)
Sodium: 151 mmol/L — ABNORMAL HIGH (ref 135–145)

## 2019-03-09 LAB — CBC
HCT: 29.5 % — ABNORMAL LOW (ref 39.0–52.0)
Hemoglobin: 8.4 g/dL — ABNORMAL LOW (ref 13.0–17.0)
MCH: 22.2 pg — ABNORMAL LOW (ref 26.0–34.0)
MCHC: 28.5 g/dL — ABNORMAL LOW (ref 30.0–36.0)
MCV: 78 fL — ABNORMAL LOW (ref 80.0–100.0)
Platelets: 214 10*3/uL (ref 150–400)
RBC: 3.78 MIL/uL — ABNORMAL LOW (ref 4.22–5.81)
RDW: 27.1 % — ABNORMAL HIGH (ref 11.5–15.5)
WBC: 8 10*3/uL (ref 4.0–10.5)
nRBC: 0 % (ref 0.0–0.2)

## 2019-03-09 LAB — GLUCOSE, CAPILLARY
Glucose-Capillary: 121 mg/dL — ABNORMAL HIGH (ref 70–99)
Glucose-Capillary: 129 mg/dL — ABNORMAL HIGH (ref 70–99)
Glucose-Capillary: 135 mg/dL — ABNORMAL HIGH (ref 70–99)
Glucose-Capillary: 140 mg/dL — ABNORMAL HIGH (ref 70–99)
Glucose-Capillary: 159 mg/dL — ABNORMAL HIGH (ref 70–99)
Glucose-Capillary: 180 mg/dL — ABNORMAL HIGH (ref 70–99)

## 2019-03-09 MED ORDER — MORPHINE BOLUS VIA INFUSION
1.0000 mg | INTRAVENOUS | Status: DC | PRN
  Administered 2019-03-09: 13:00:00 1 mg via INTRAVENOUS
  Filled 2019-03-09: qty 1

## 2019-03-09 MED ORDER — GLYCOPYRROLATE 0.2 MG/ML IJ SOLN
0.2000 mg | INTRAMUSCULAR | Status: DC | PRN
  Administered 2019-03-09: 0.2 mg via INTRAVENOUS
  Filled 2019-03-09: qty 1

## 2019-03-09 MED ORDER — MORPHINE 100MG IN NS 100ML (1MG/ML) PREMIX INFUSION
1.0000 mg/h | INTRAVENOUS | Status: DC
  Administered 2019-03-09: 1 mg/h via INTRAVENOUS
  Filled 2019-03-09: qty 100

## 2019-03-09 MED ORDER — SODIUM CHLORIDE 0.45 % IV SOLN
INTRAVENOUS | Status: DC
  Administered 2019-03-09: 07:00:00 via INTRAVENOUS

## 2019-03-10 ENCOUNTER — Telehealth: Payer: Self-pay | Admitting: Emergency Medicine

## 2019-03-10 NOTE — Telephone Encounter (Deleted)
03/10/2019 received DC from [facility] for Dr. Lamonte Sakai to sign will send it to [place] For him to sign.PWR  03/10/19 received signed DC back faxed copy to [name] and called them to pick up.PWR

## 2019-03-11 NOTE — Telephone Encounter (Signed)
03/11/19 Received signed DC back and called [facility] To pick up. PWR

## 2019-03-12 IMAGING — DX DG CHEST 2V
2 series · 2 of 2 positions shown · non-contrast
Comparison: Chest radiograph dated 08/24/2018

CLINICAL DATA: 57-year-old male with chest pain

EXAM:
CHEST - 2 VIEW

[w chest pa]
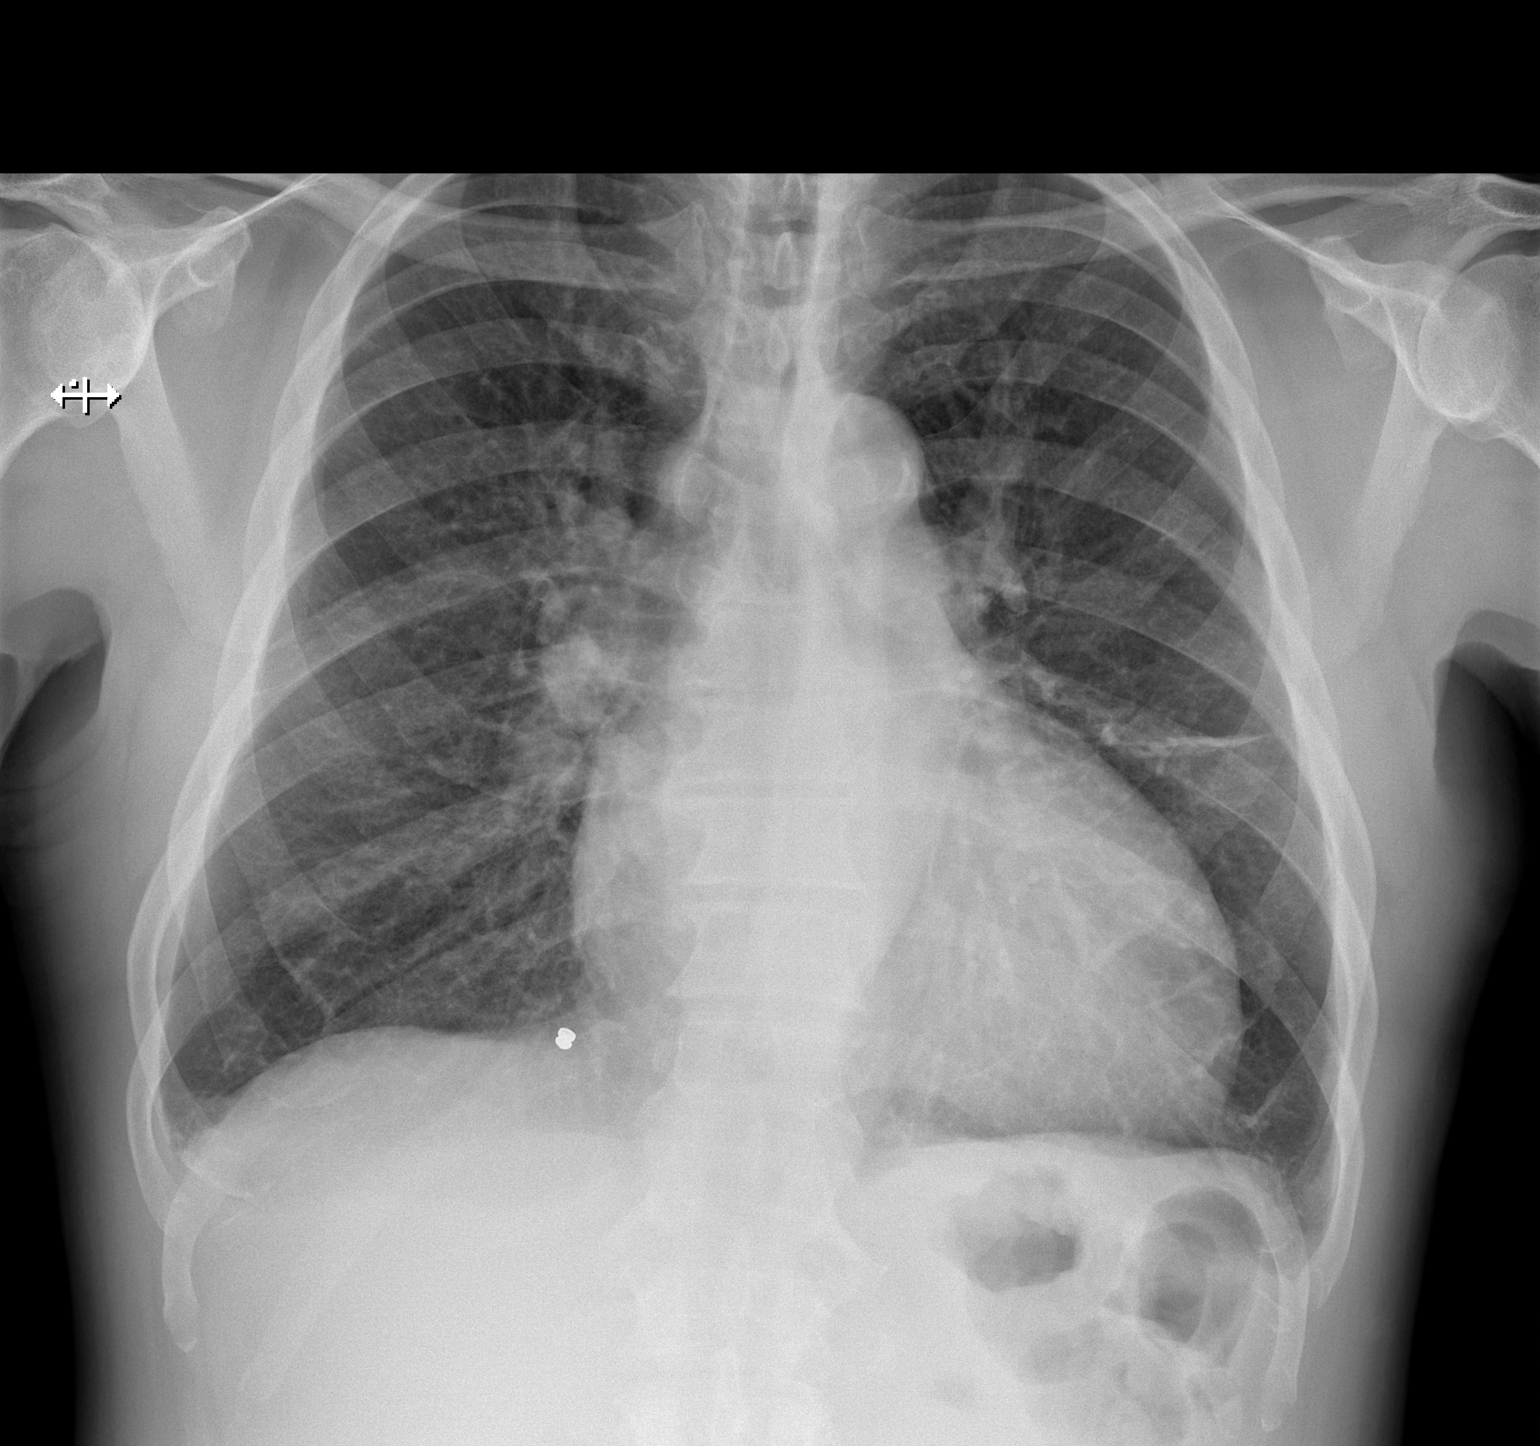

[w chest lat]
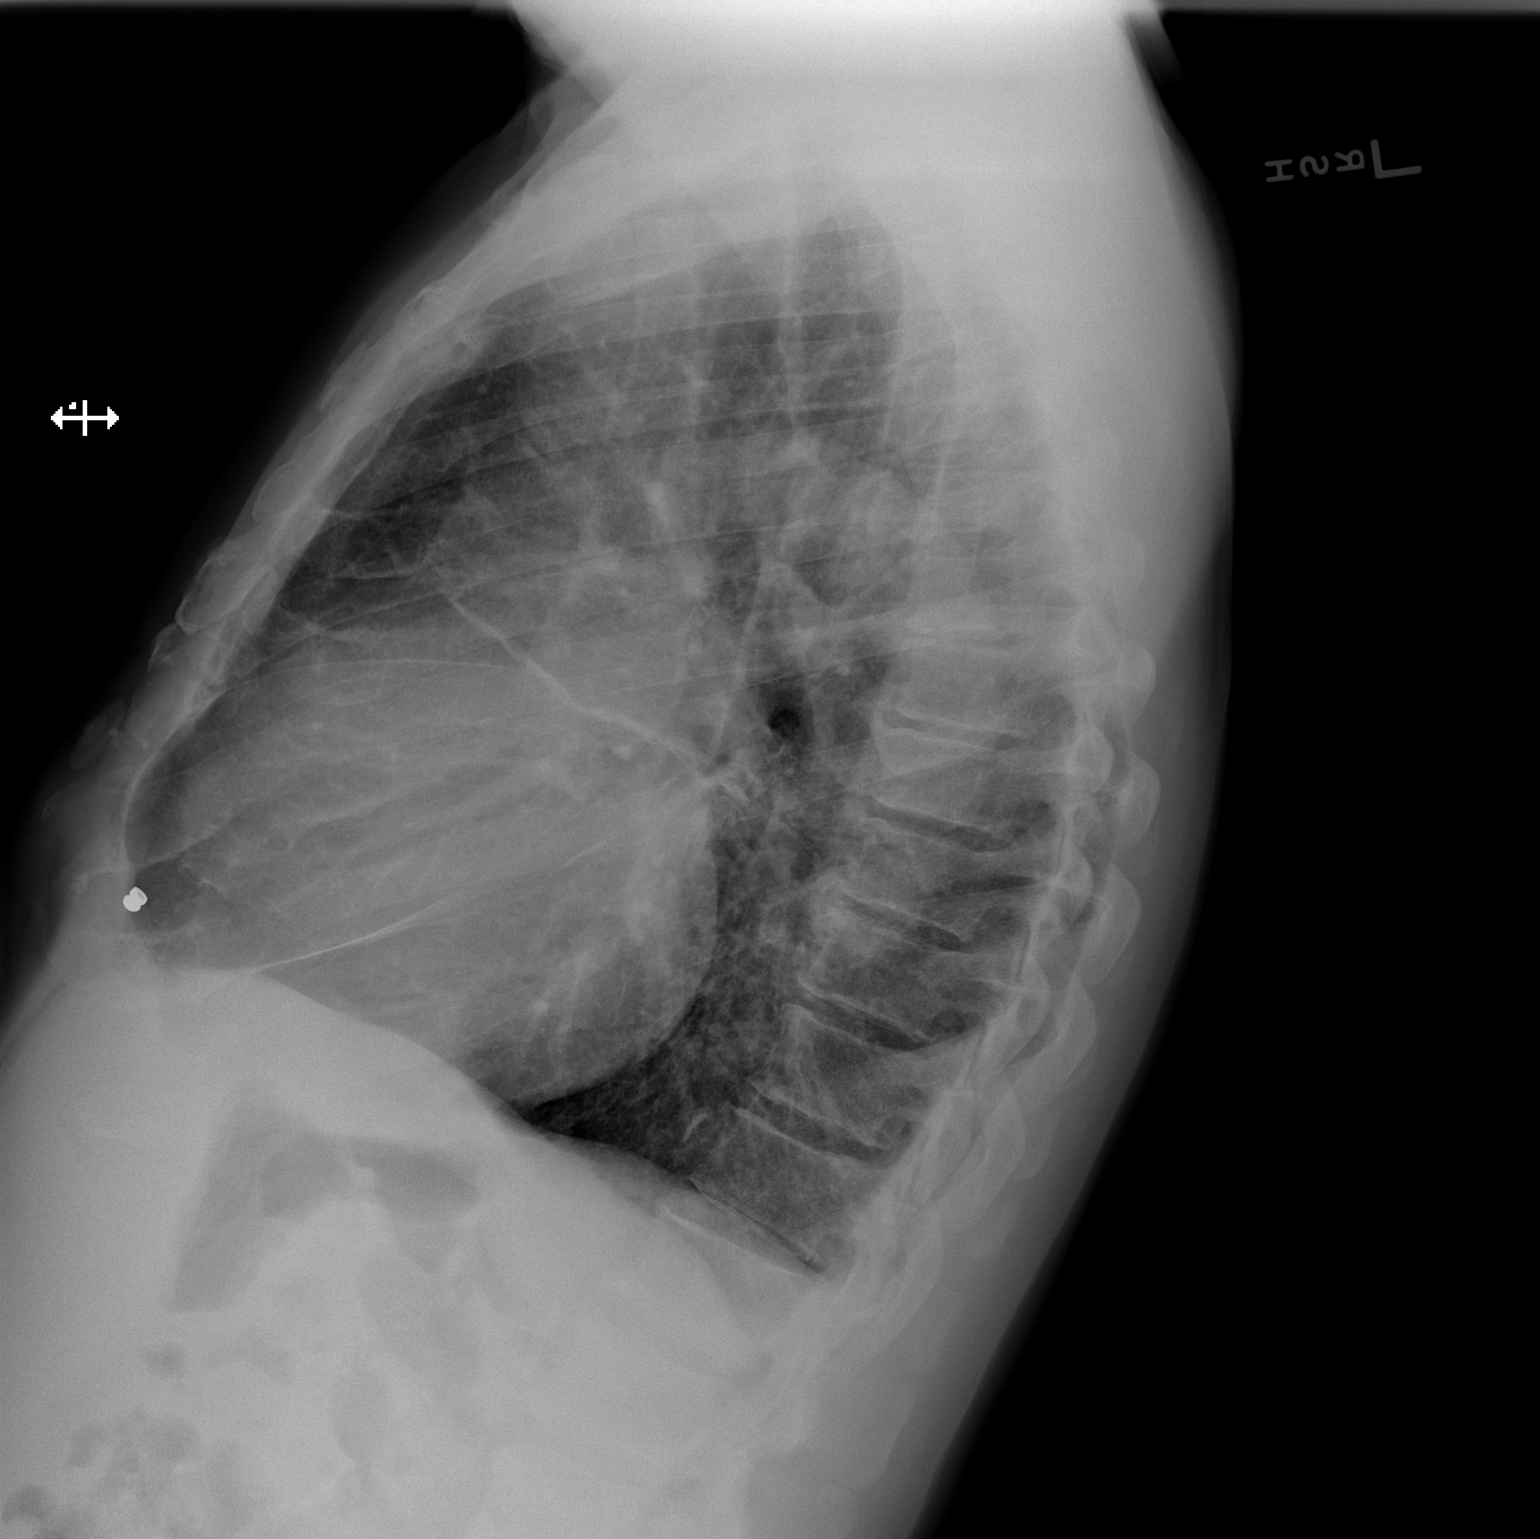

[2 of 2 positions shown; findings below may reference images not displayed]

FINDINGS: Linear atelectasis/scarring in the left mid lung field. No focal
consolidation, pleural effusion, or pneumothorax. Mild cardiomegaly.
Atherosclerotic calcification of the aortic arch. No acute osseous
pathology.
IMPRESSION: No active cardiopulmonary disease.

## 2019-03-15 IMAGING — CR DG CHEST 2V
2 series · 2 of 2 positions shown · non-contrast
Comparison: 08/29/2018, 08/24/2018

CLINICAL DATA: Shortness of breath

EXAM:
CHEST - 2 VIEW

[chest ap]
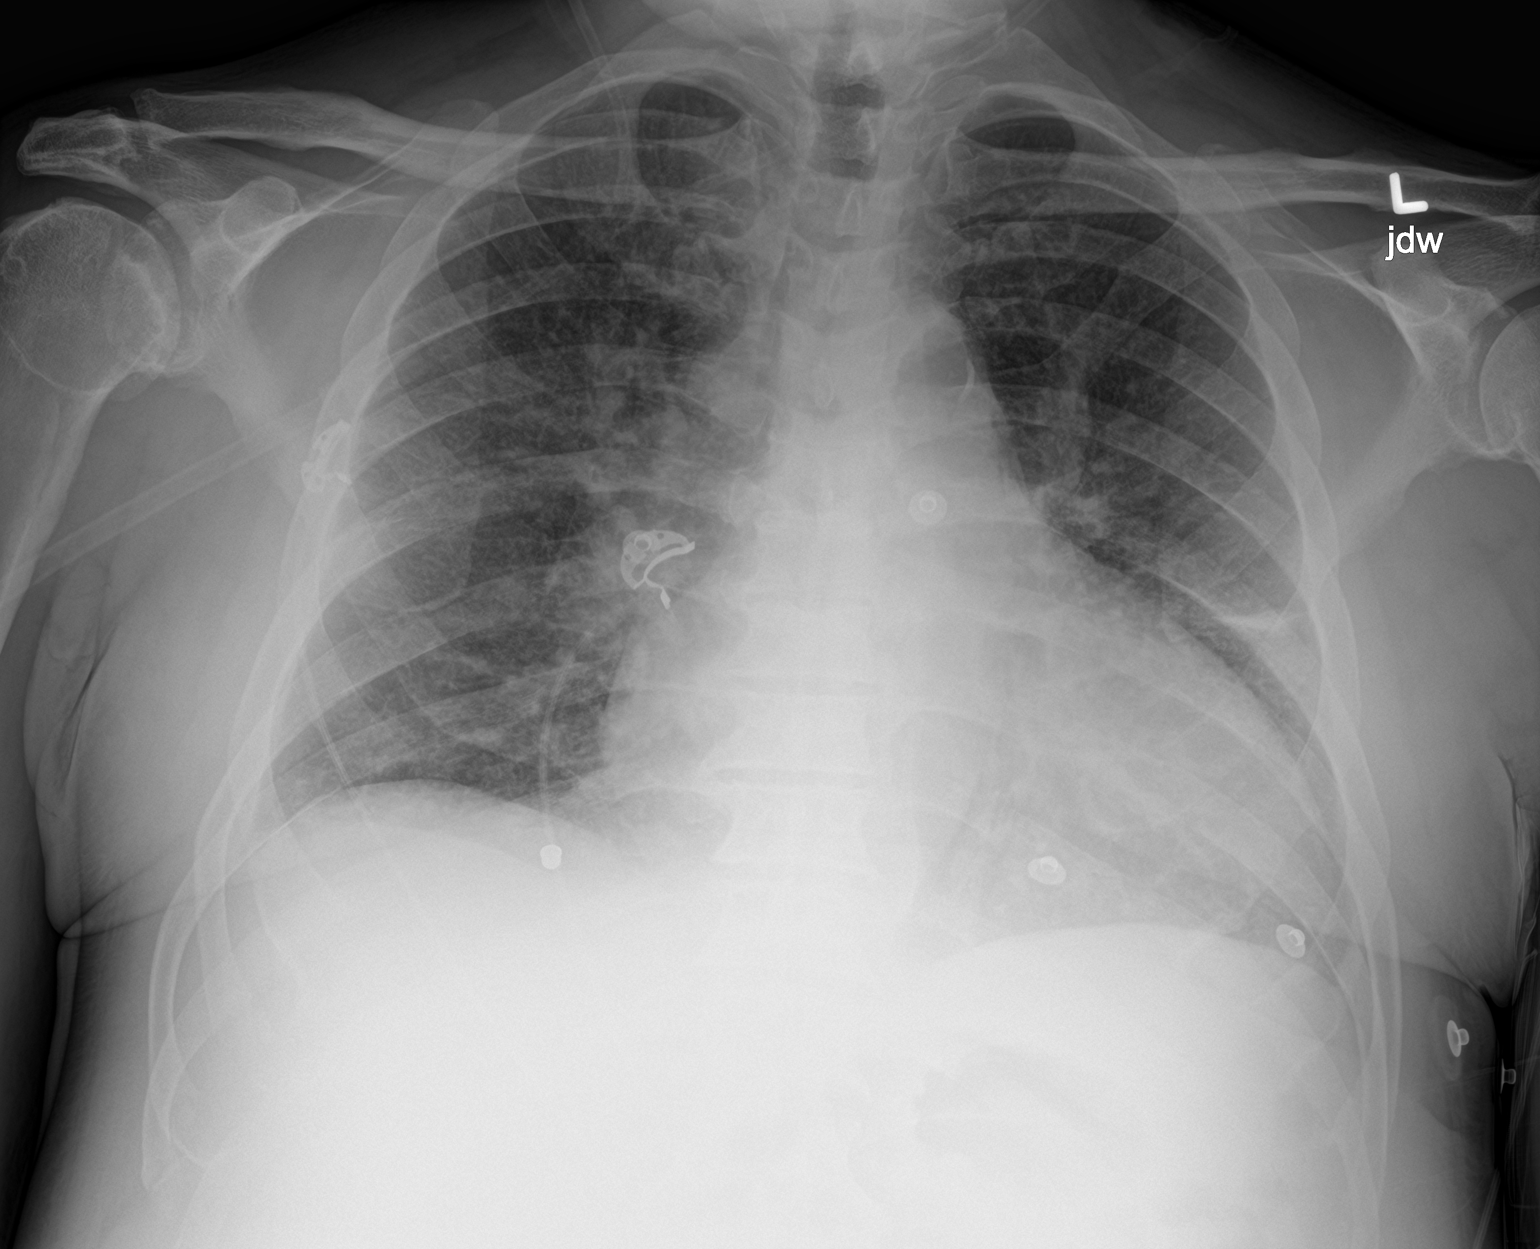

[chest lat]
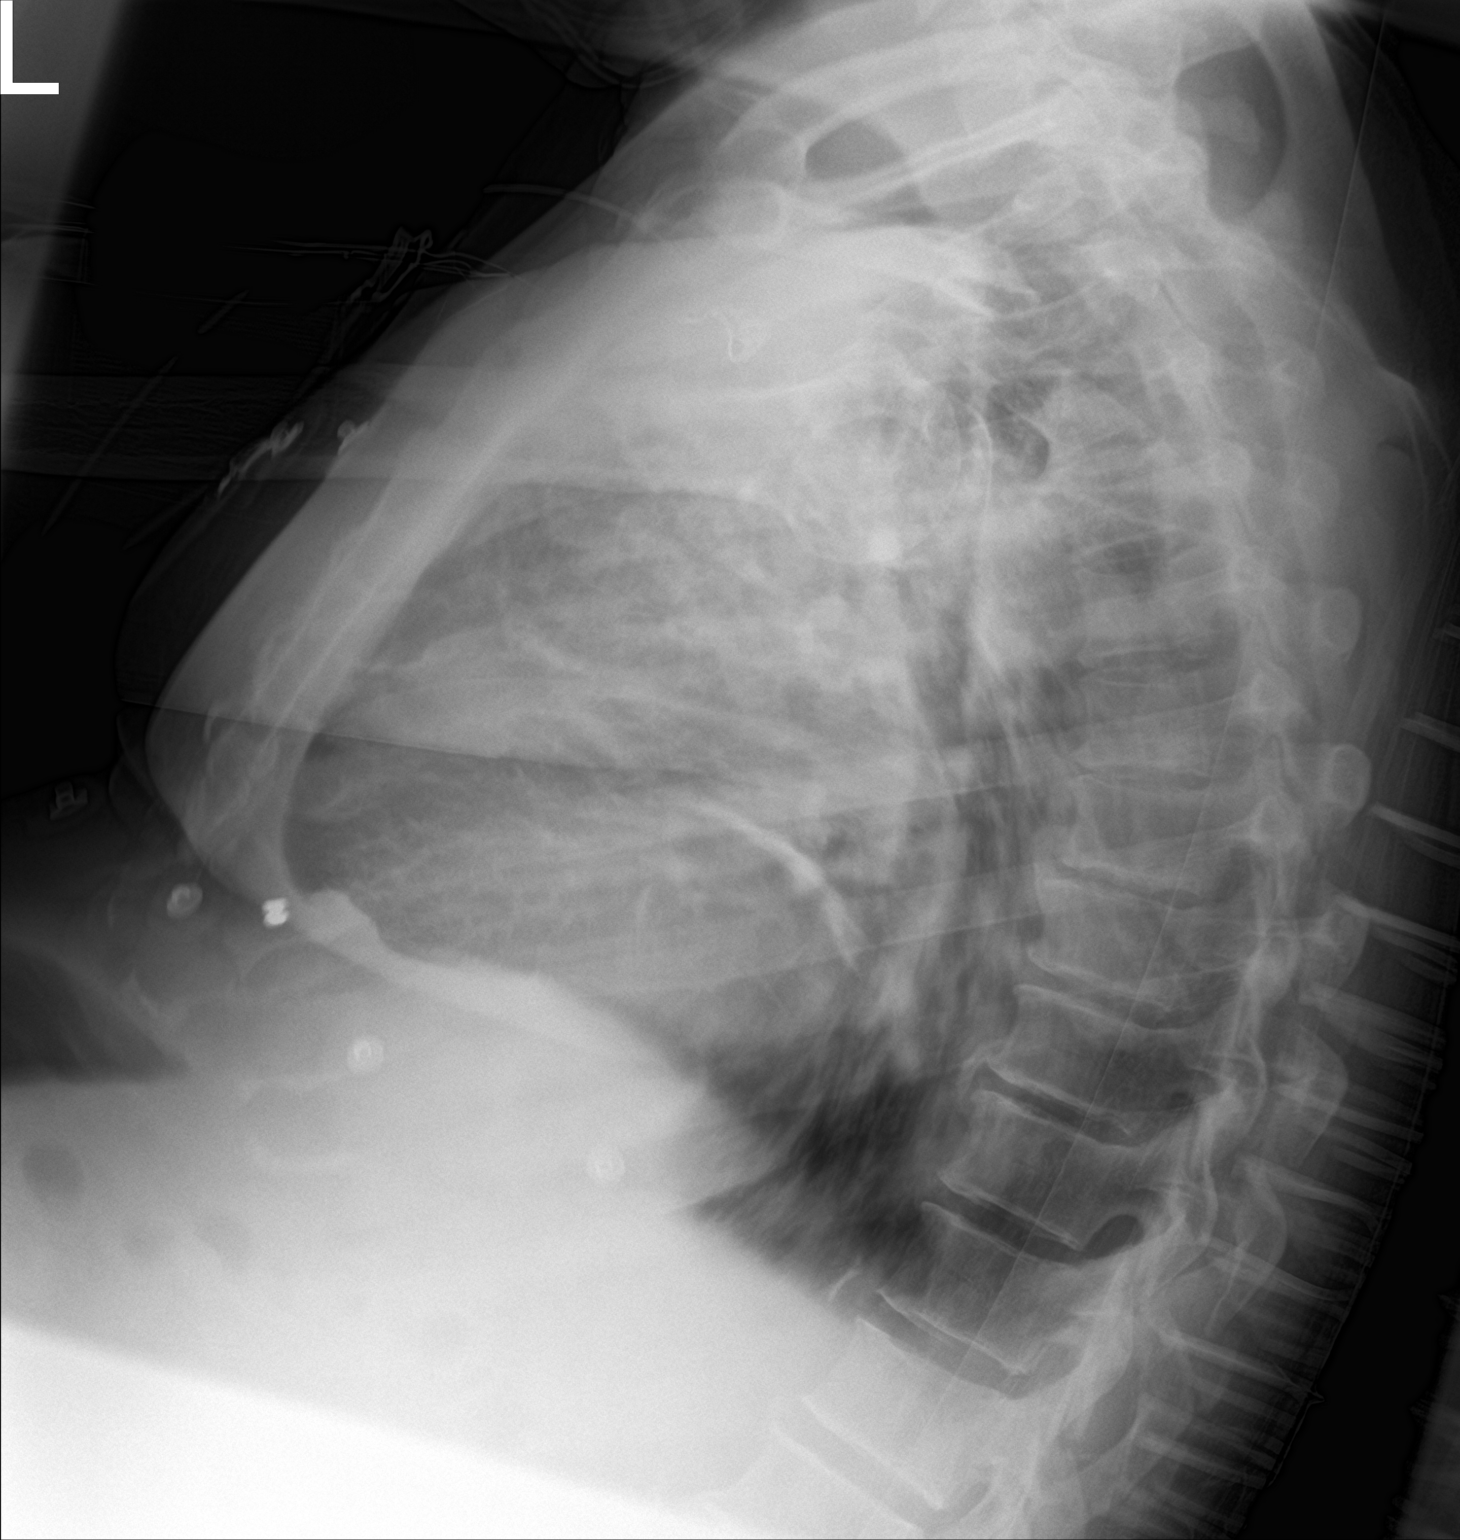

[2 of 2 positions shown; findings below may reference images not displayed]

FINDINGS: Cardiomegaly with vascular congestion and mild diffuse interstitial
and ground-glass opacity. Trace pleural effusions. No pneumothorax.
Subsegmental atelectasis or scar in the left mid lung.
IMPRESSION: 1. Cardiomegaly with vascular congestion and mild diffuse
interstitial and ground-glass opacity suspect for minimal edema.
Trace pleural effusions.
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
## 2019-03-24 IMAGING — CR CHEST - 2 VIEW
2 series · 2 of 2 positions shown · non-contrast
Comparison: September 08, 2018

CLINICAL DATA: Shortness of breath

EXAM:
CHEST - 2 VIEW

[w chest pa]
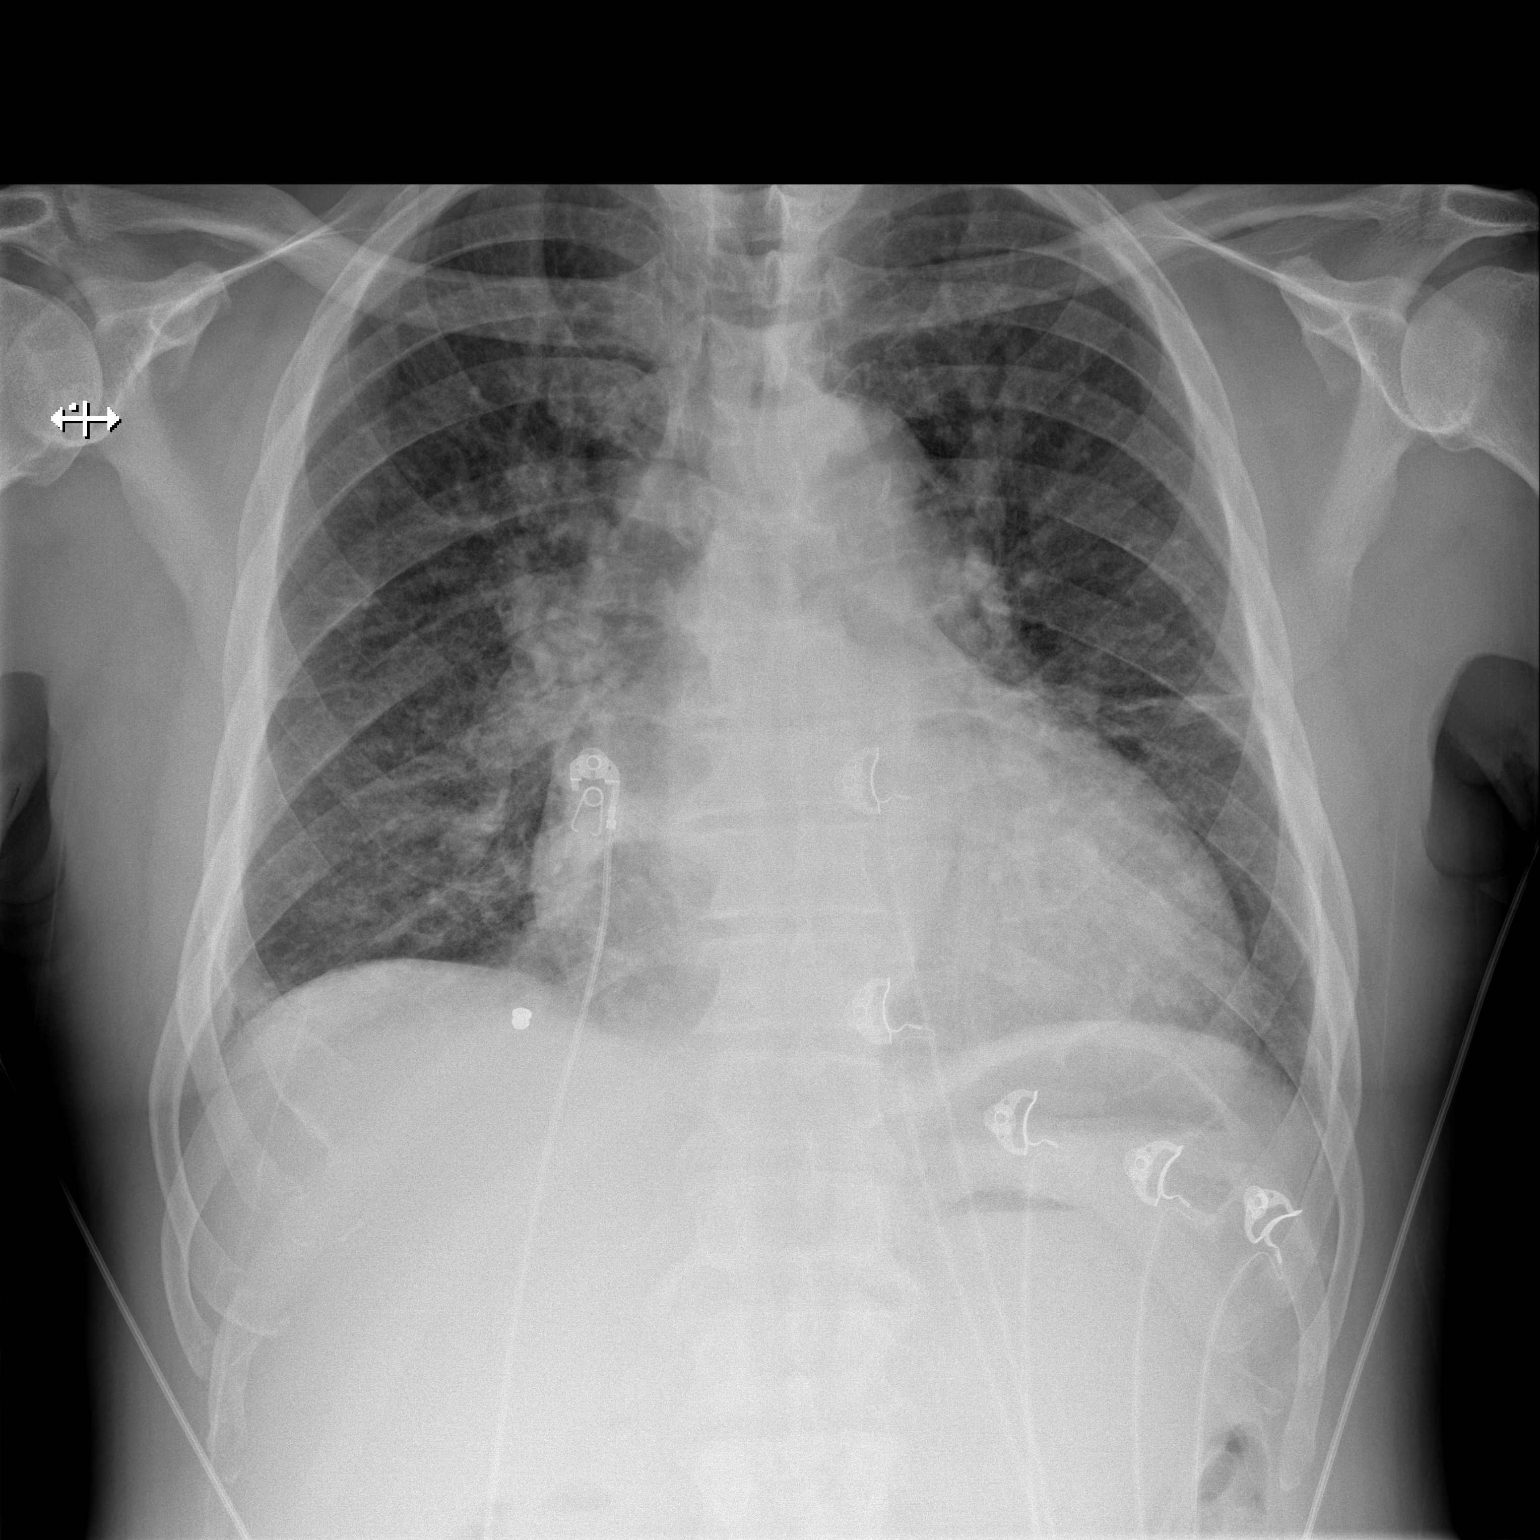

[w chest lat]
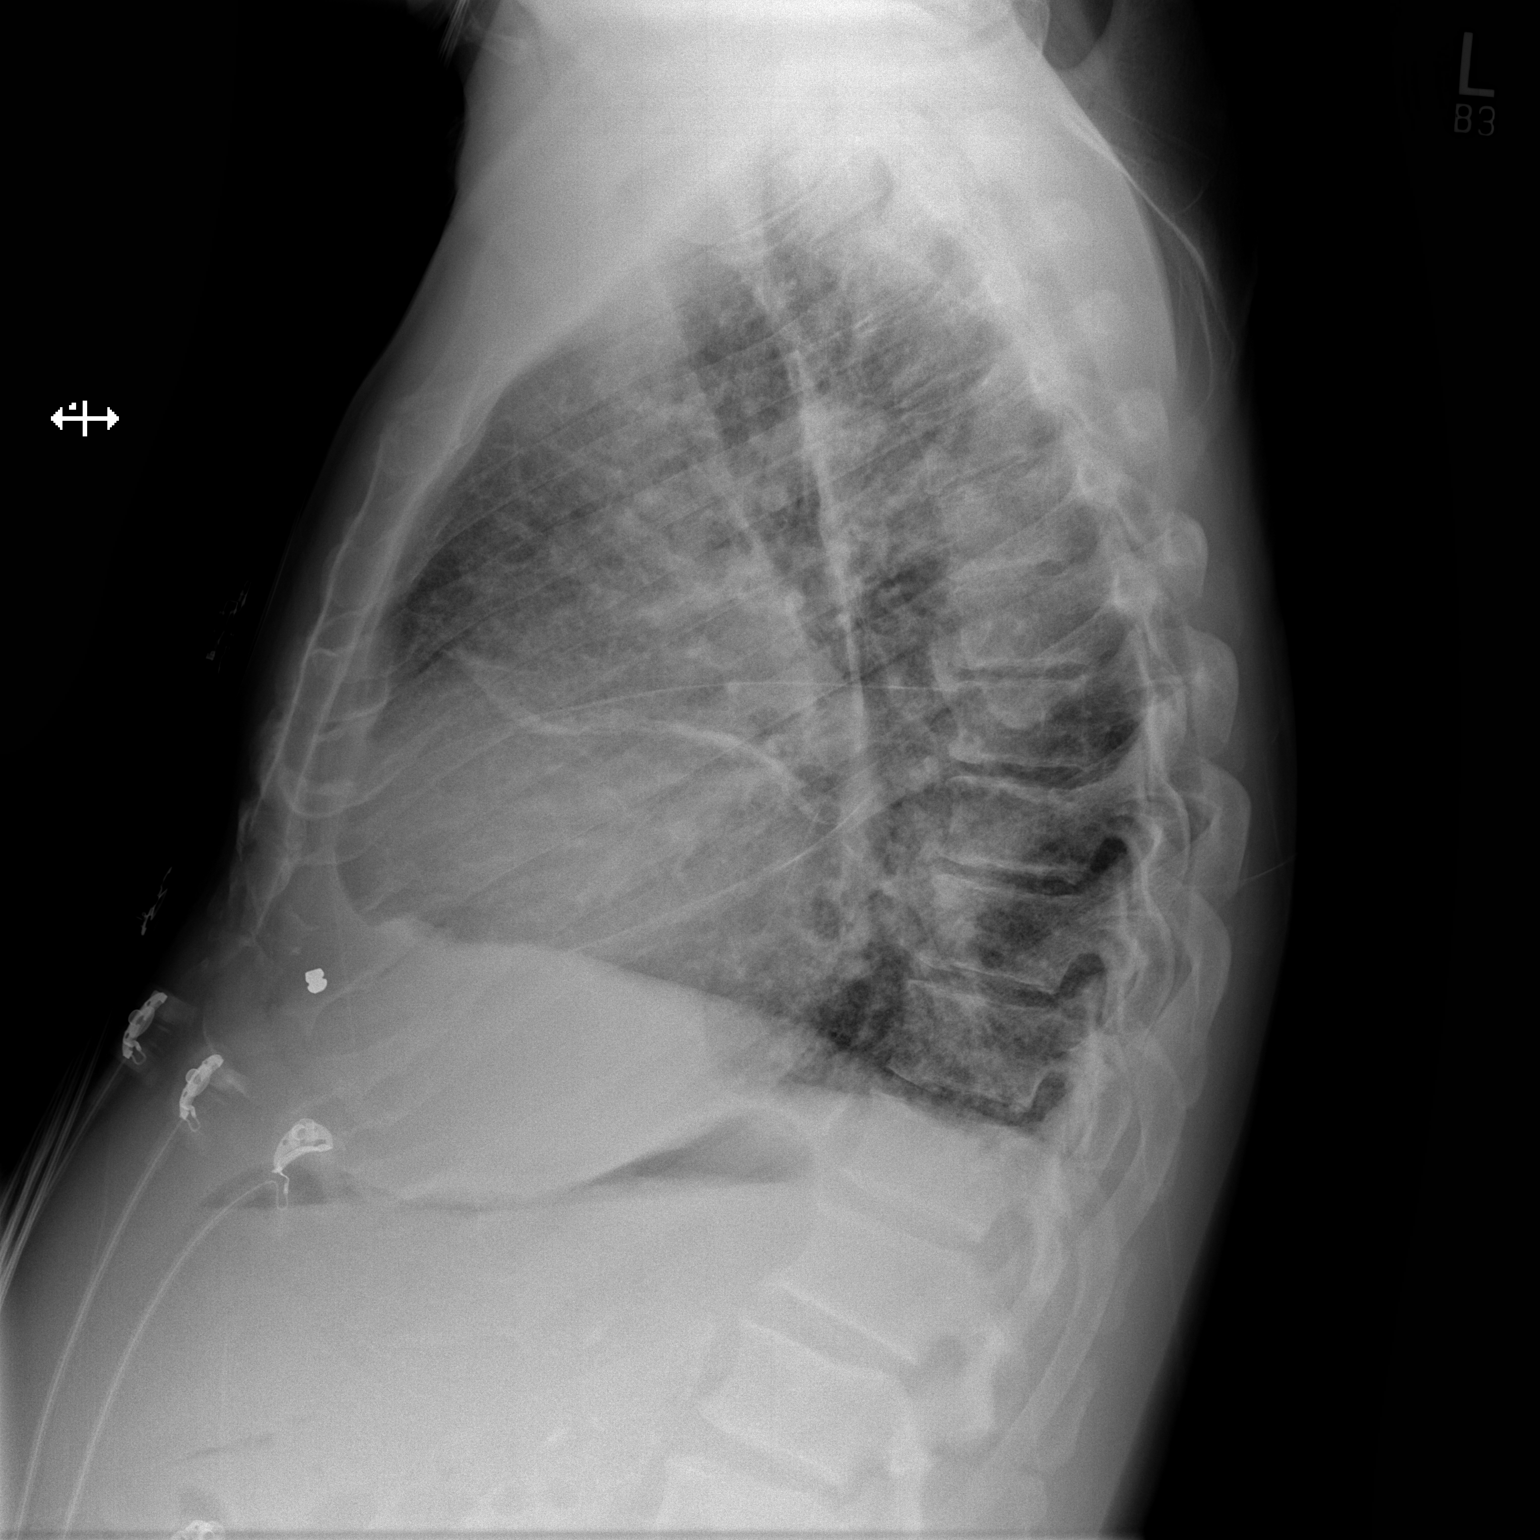

[2 of 2 positions shown; findings below may reference images not displayed]

FINDINGS: There is atelectatic change in the left mid lung. There is no edema
or consolidation. There is cardiomegaly with pulmonary venous
hypertension. There is no appreciable adenopathy. No bone lesions.
IMPRESSION: Pulmonary vascular congestion. Left midlung atelectasis. No frank
edema or consolidation.

## 2019-03-27 IMAGING — US ULTRASOUND SCROTUM DOPPLER COMPLETE
1 series · 14 of 25 positions shown · non-contrast
Comparison: 07/18/2018

CLINICAL DATA: Swelling and testicular pain

EXAM:
SCROTAL ULTRASOUND
DOPPLER ULTRASOUND OF THE TESTICLES
TECHNIQUE: Complete ultrasound examination of the testicles, epididymis, and
other scrotal structures was performed. Color and spectral Doppler
ultrasound were also utilized to evaluate blood flow to the
testicles.

[Series 1: ultrasound scrotum doppler complete · 75 acquisitions, 14 frames shown]
[im 1/75]
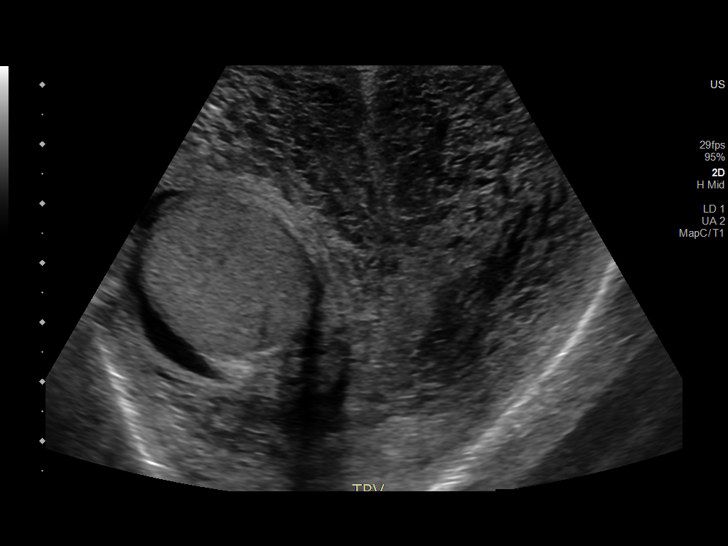
[im 7/75]
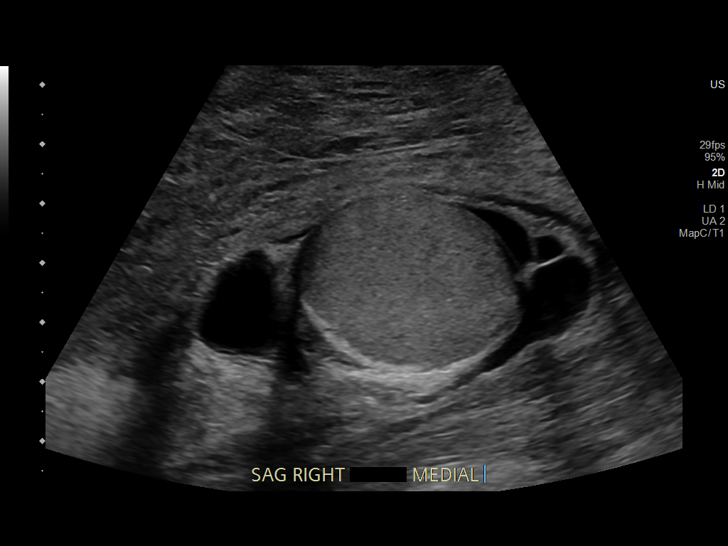
[im 13/75]
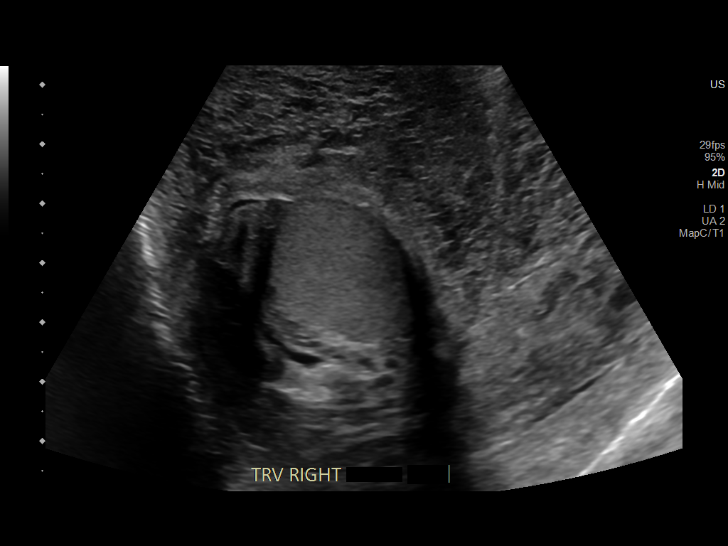
[im 19/75]
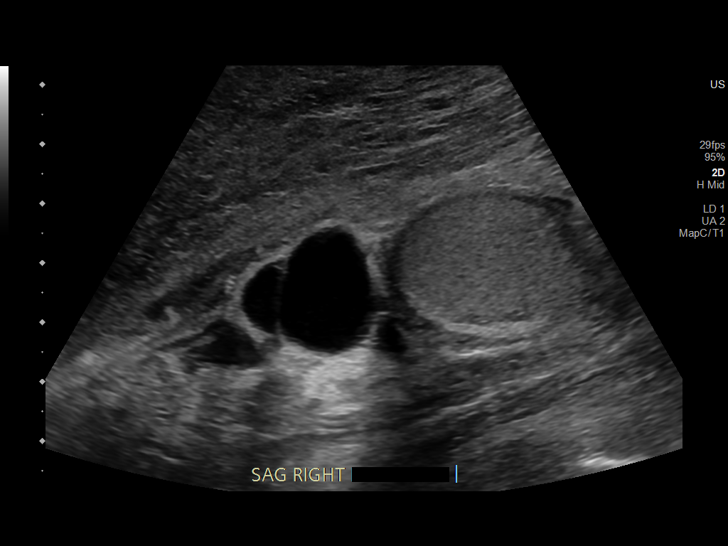
[im 25/75]
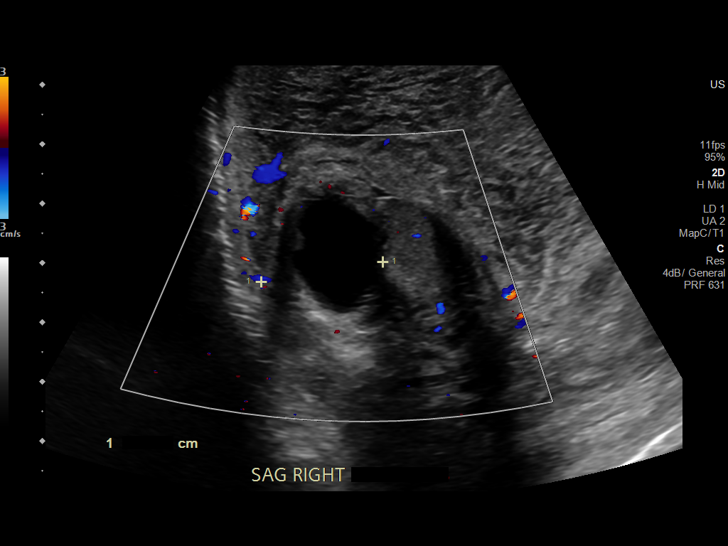
[im 28/75]
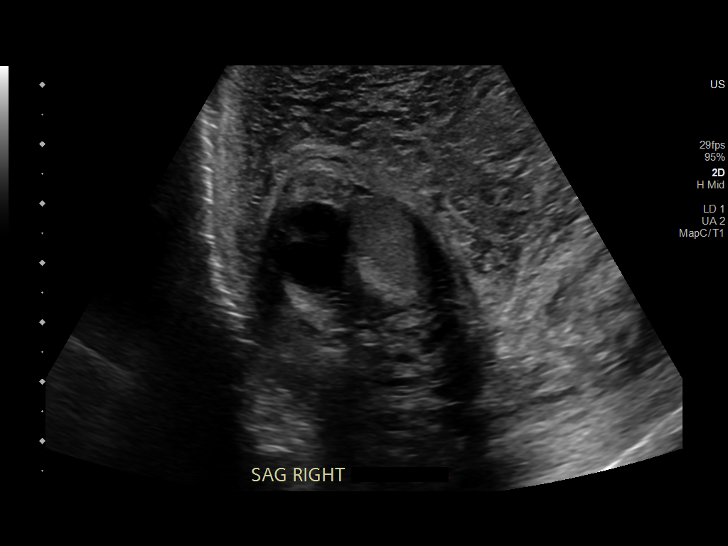
[im 34/75]
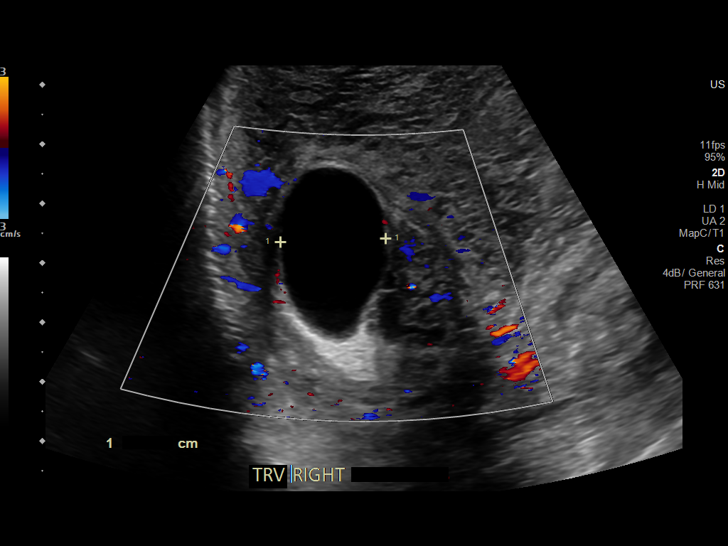
[im 41/75]
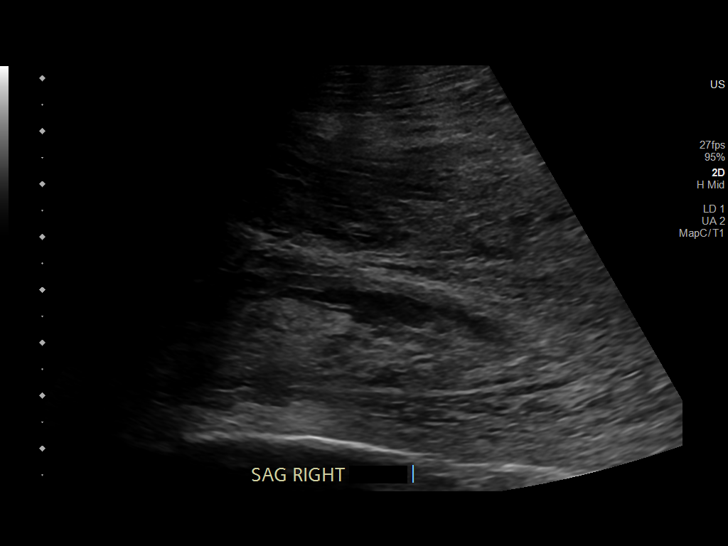
[im 47/75]
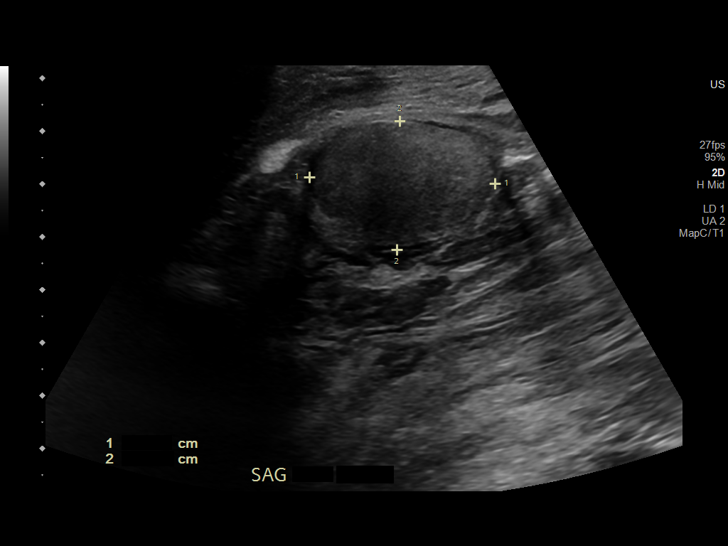
[im 50/75]
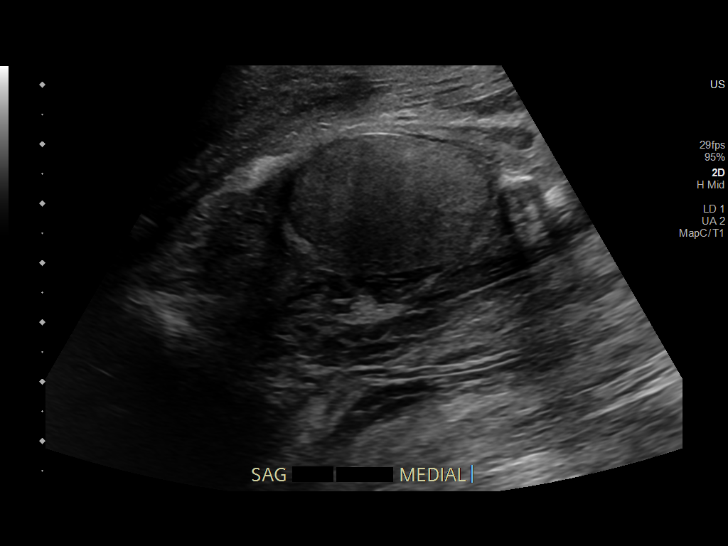
[im 56/75]
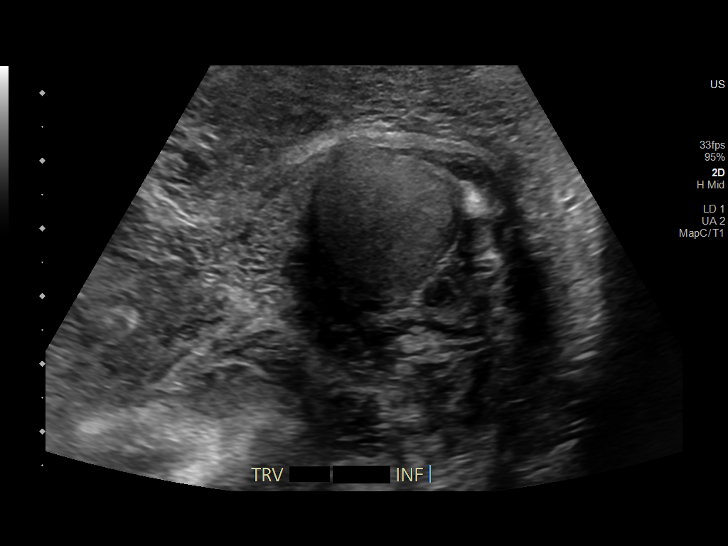
[im 62/75]
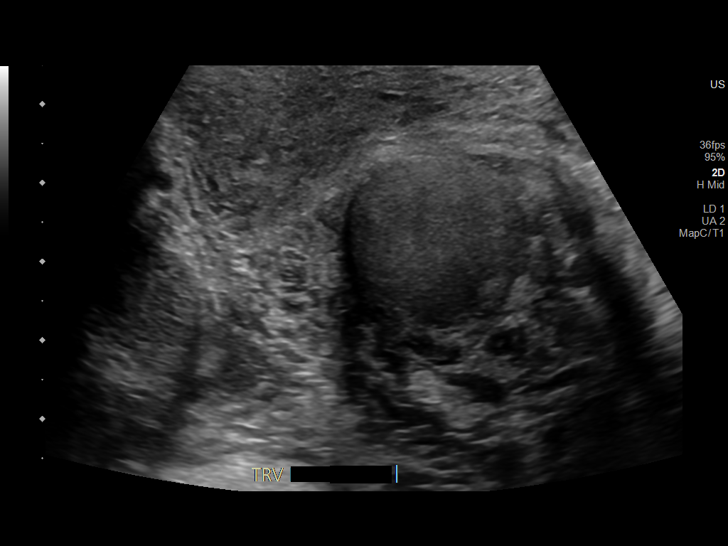
[im 68/75]
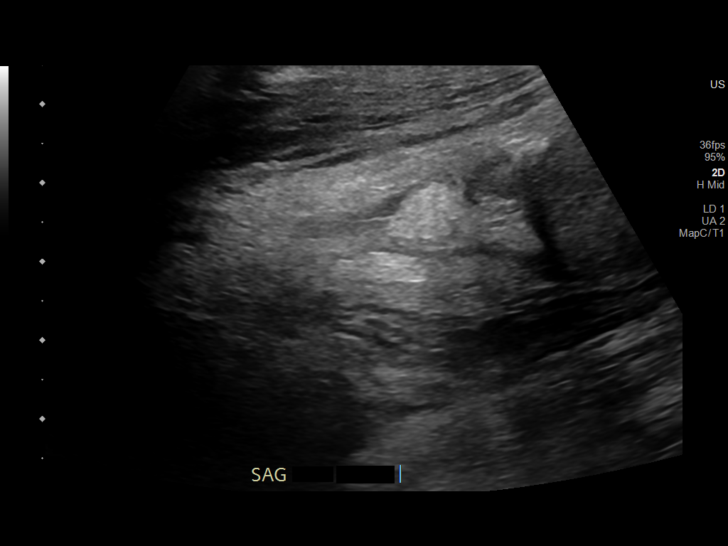
[im 75/75]
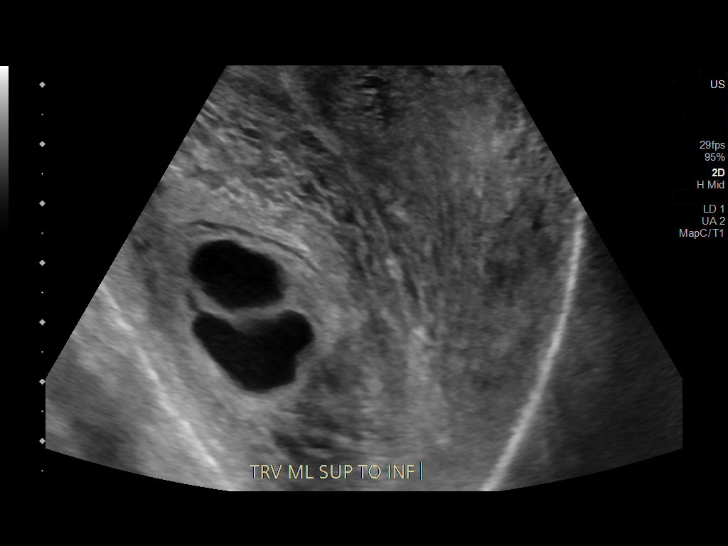

[14 of 25 positions shown; findings below may reference images not displayed]

FINDINGS: Right testicle

Measurements: 3 x 2.9 x 3 cm.  No mass or microlithiasis visualized.

Left testicle

Measurements: 3.5 x 2.4 x 2.2 cm. No mass or microlithiasis
visualized.

Right epididymis:  Multiple cysts, the largest measures 2.3 cm

Left epididymis:  Normal in size and appearance.

Hydrocele:  Small right-sided hydrocele.

Varicocele:  None visualized.

Pulsed Doppler interrogation of both testes demonstrates normal low
resistance arterial and venous waveforms bilaterally.

Diffuse skin thickening and scrotal edema. Possible fat hernia in
the left groin.
IMPRESSION: 1. Negative for acute testicular torsion or suspicious testicular
mass lesion
2. Marked scrotal edema.
3. Small right hydrocele.  Multiple right epididymal cysts
4. Possible fat containing left groin hernia

## 2019-03-27 IMAGING — DX CHEST - 2 VIEW
2 series · 2 of 2 positions shown · non-contrast
Comparison: 09/10/2018, 09/08/2018, 09/01/2018

CLINICAL DATA: Groin swelling short of breath

EXAM:
CHEST - 2 VIEW

[chest pa]
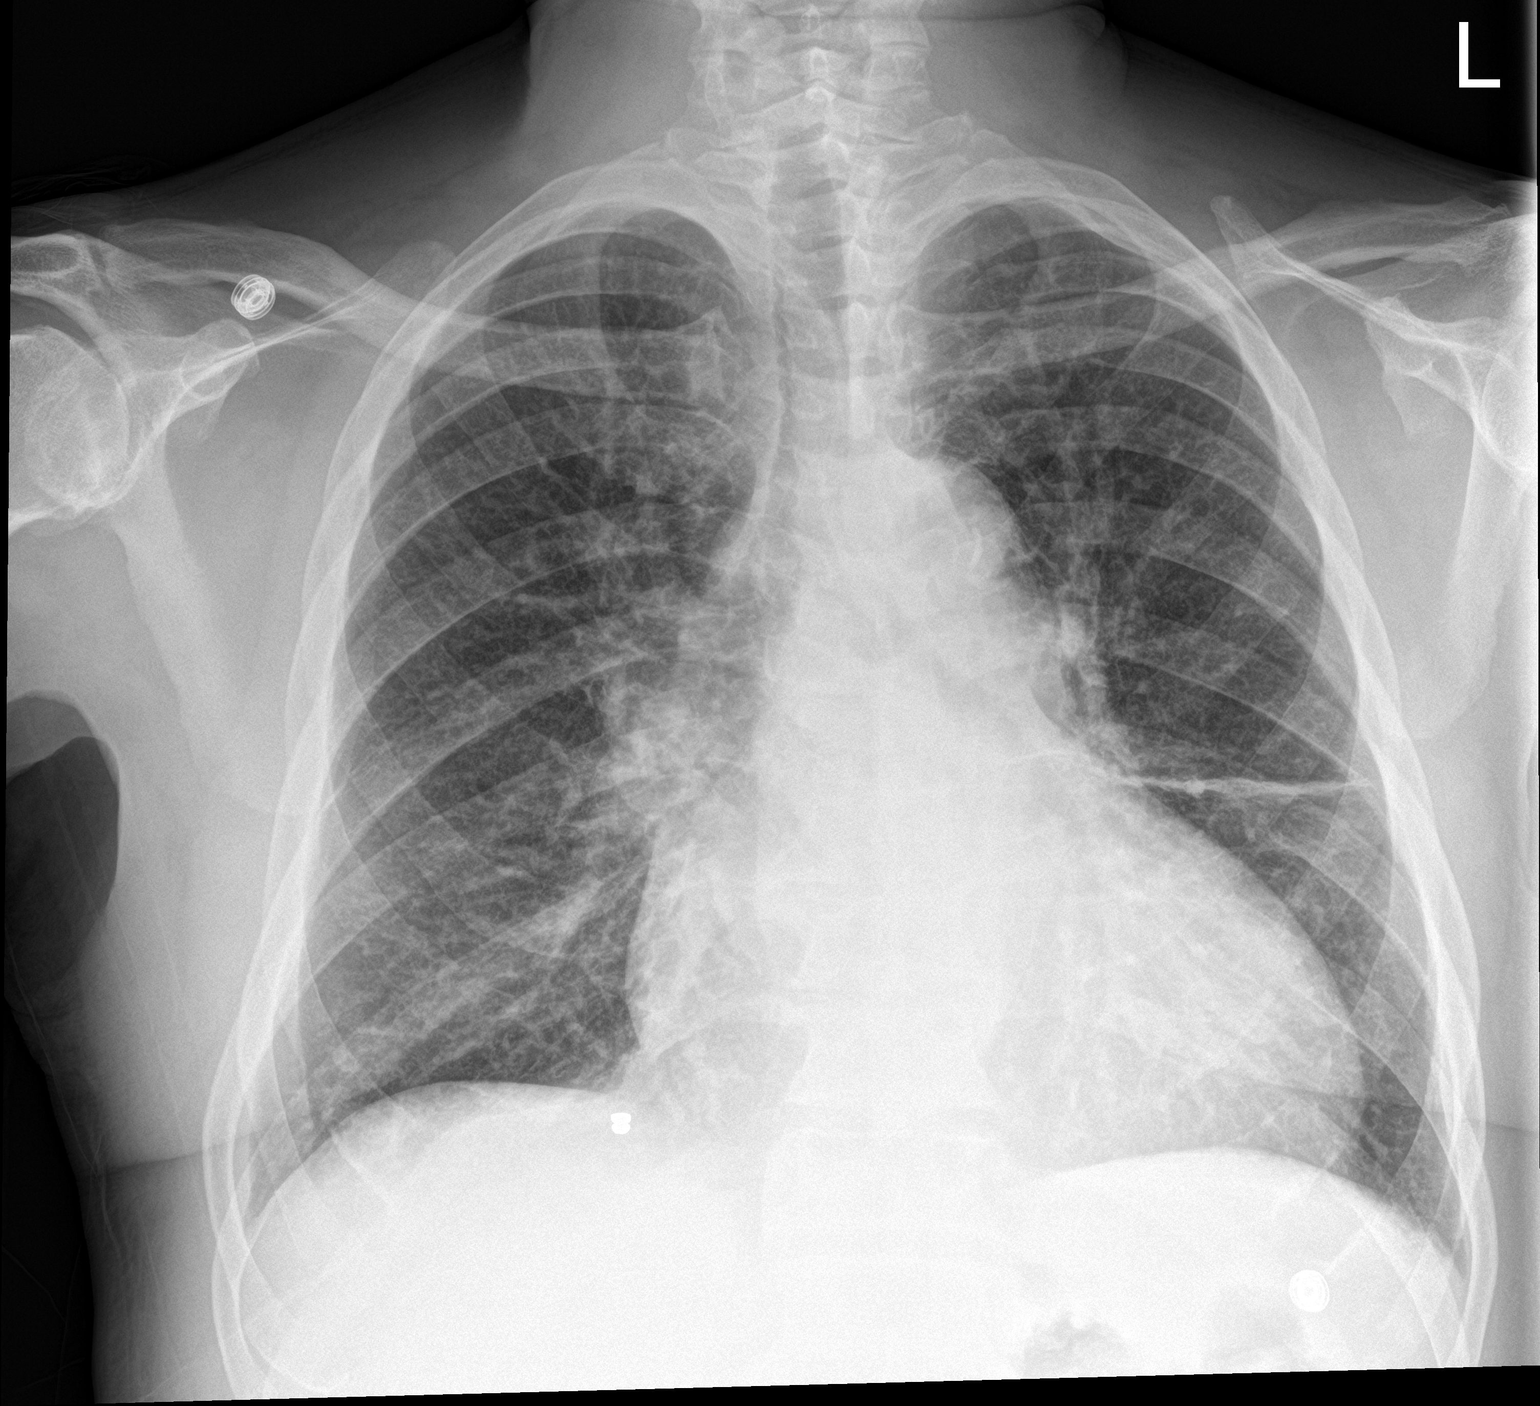

[chest lat]
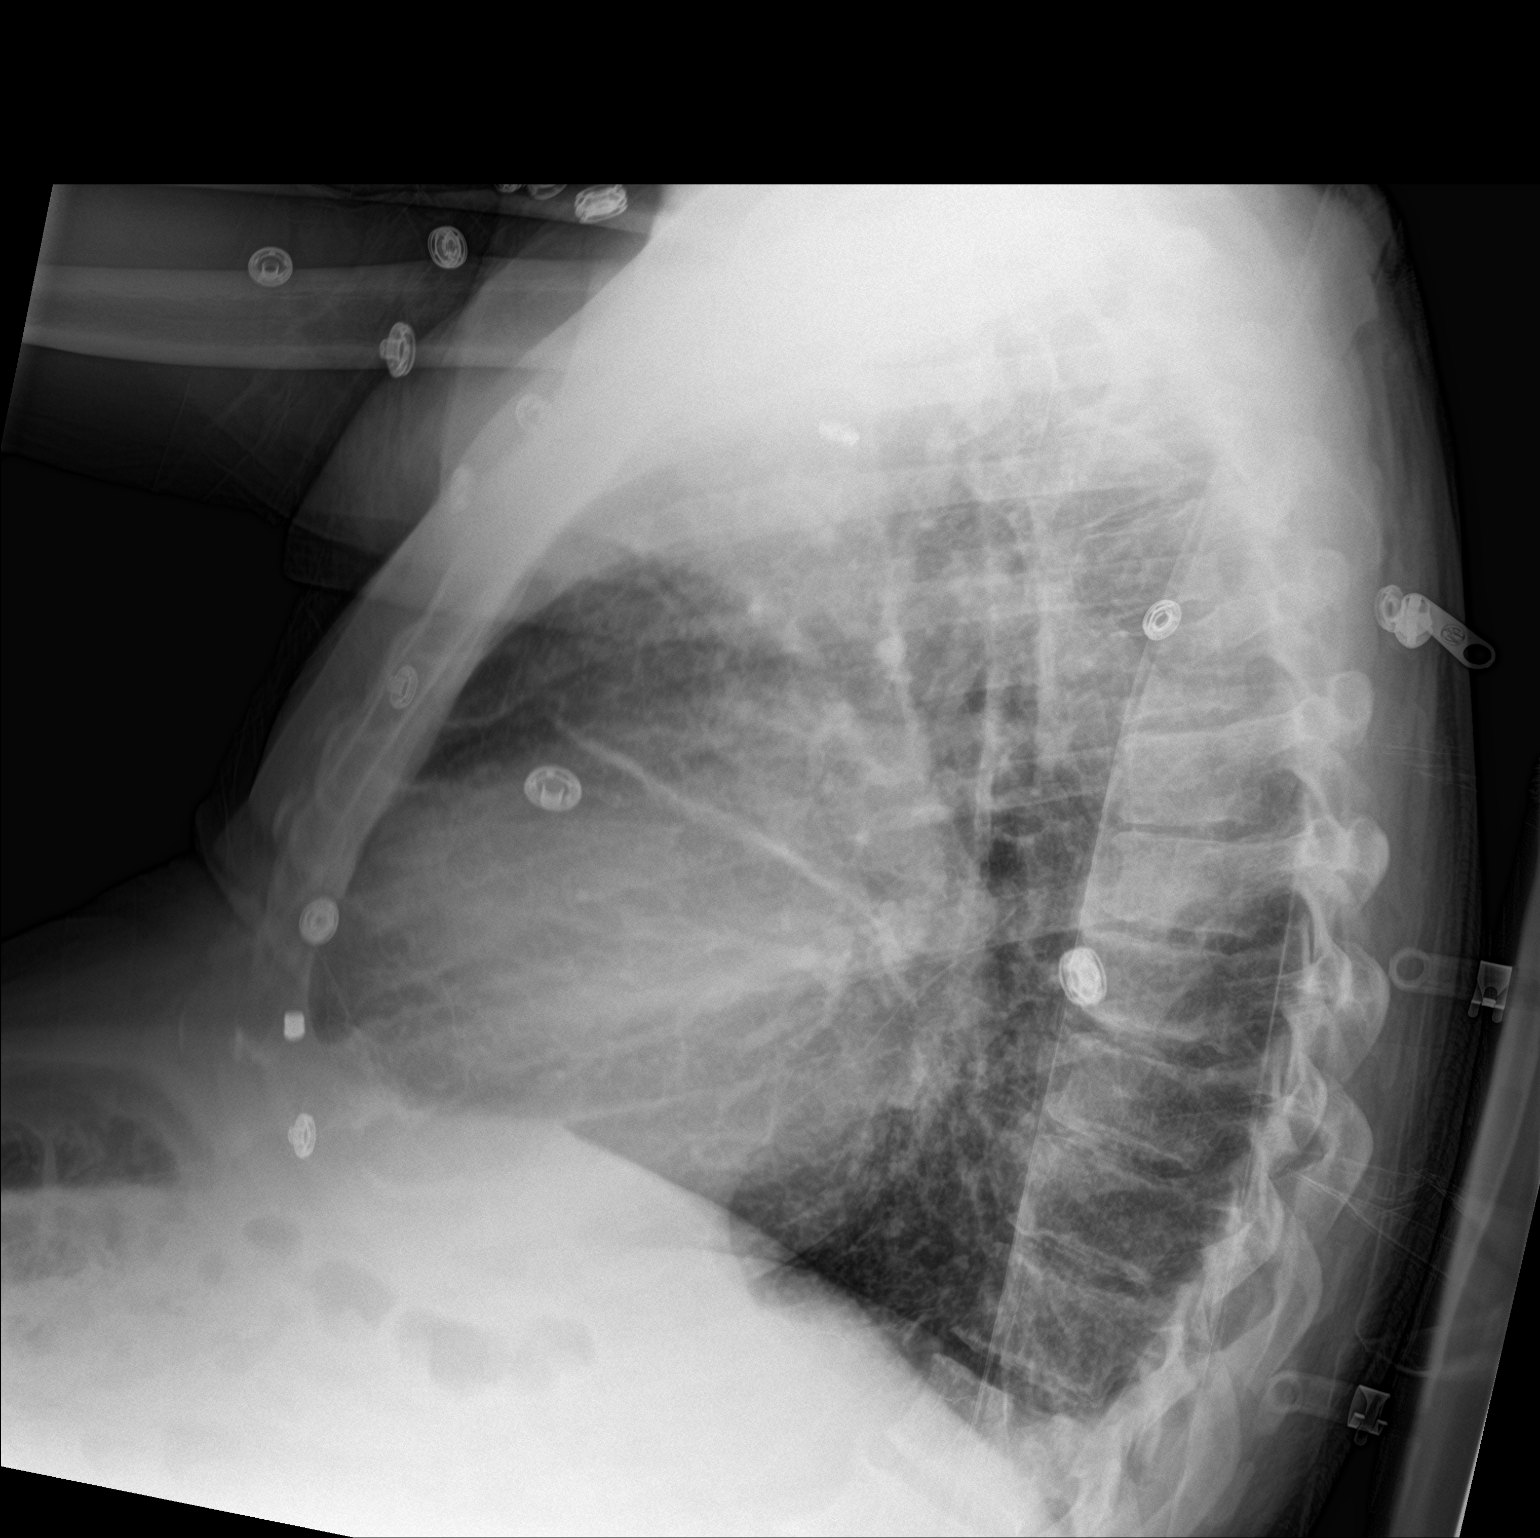

[2 of 2 positions shown; findings below may reference images not displayed]

FINDINGS: Cardiomegaly with vascular congestion. Linear atelectasis in the
left mid lung. No acute consolidation or significant pleural
effusion. No pneumothorax. Aortic atherosclerosis.
IMPRESSION: Cardiomegaly with mild central vascular congestion.

## 2019-03-29 IMAGING — CR CHEST - 2 VIEW
2 series · 2 of 2 positions shown · non-contrast
Comparison: 09/13/2018

CLINICAL DATA: Chest pain

EXAM:
CHEST - 2 VIEW

[chest lat]
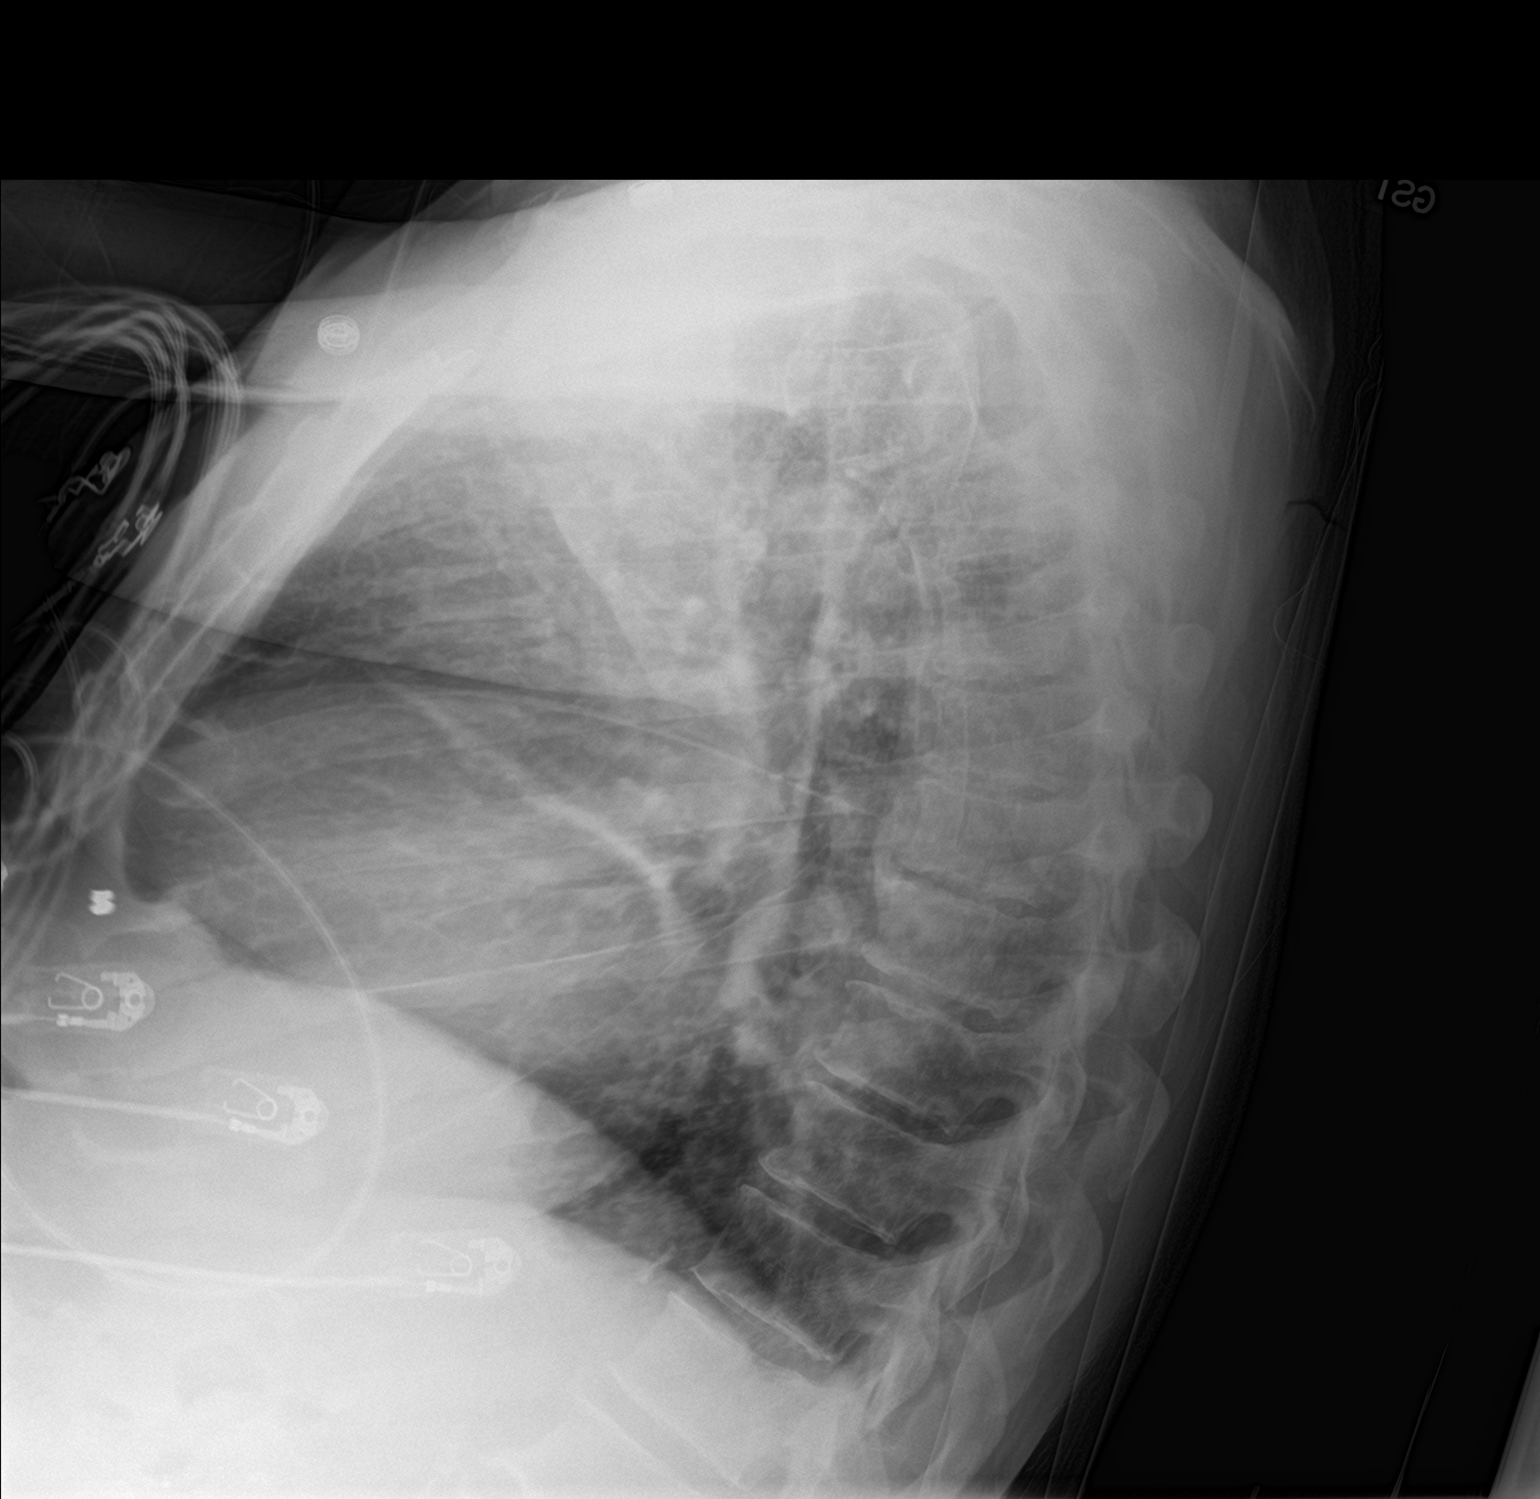

[chest ap]
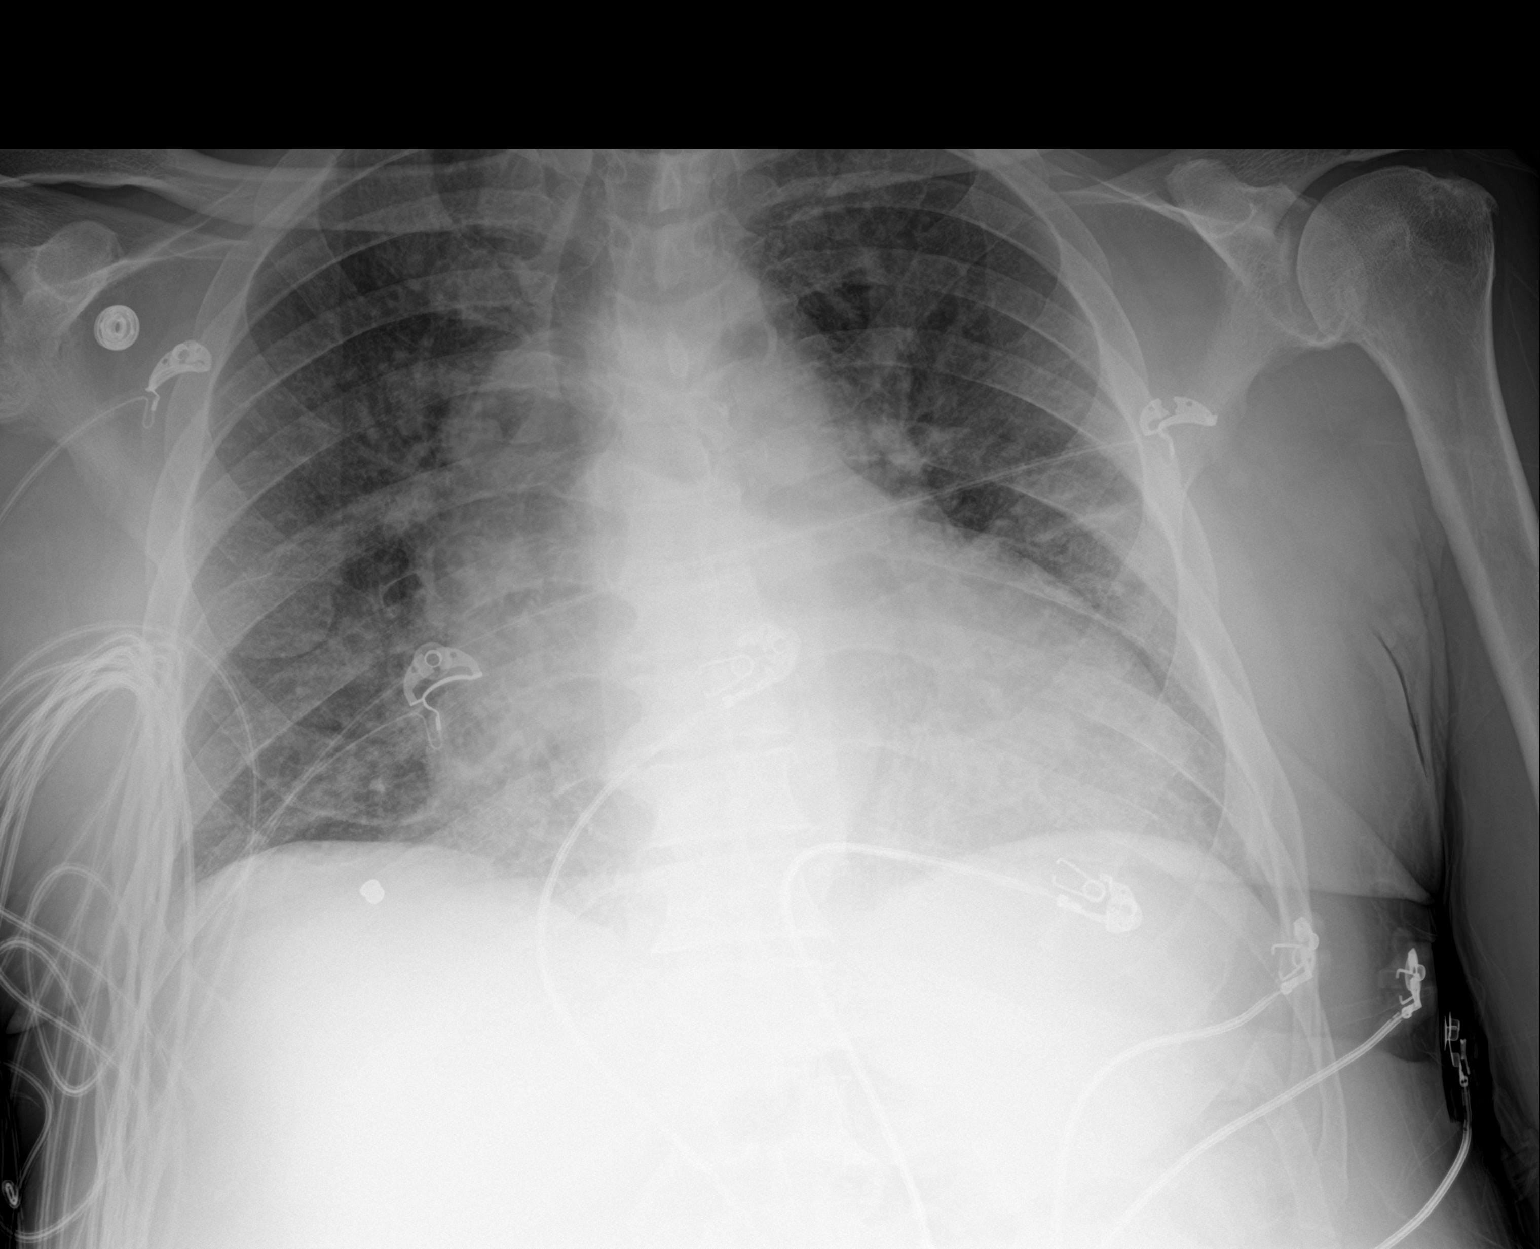

[2 of 2 positions shown; findings below may reference images not displayed]

FINDINGS: Cardiomegaly with vascular congestion. Possible early interstitial
edema. No confluent opacities or effusions. No acute bony
abnormality.
IMPRESSION: Cardiomegaly with vascular congestion and possible early
interstitial edema.

## 2019-03-31 IMAGING — DX PORTABLE CHEST - 1 VIEW
1 series · 1 of 1 positions shown · non-contrast
Comparison: 09/15/2018.

CLINICAL DATA: Chest pain.  Shortness of breath.

EXAM:
PORTABLE CHEST 1 VIEW

[chest]
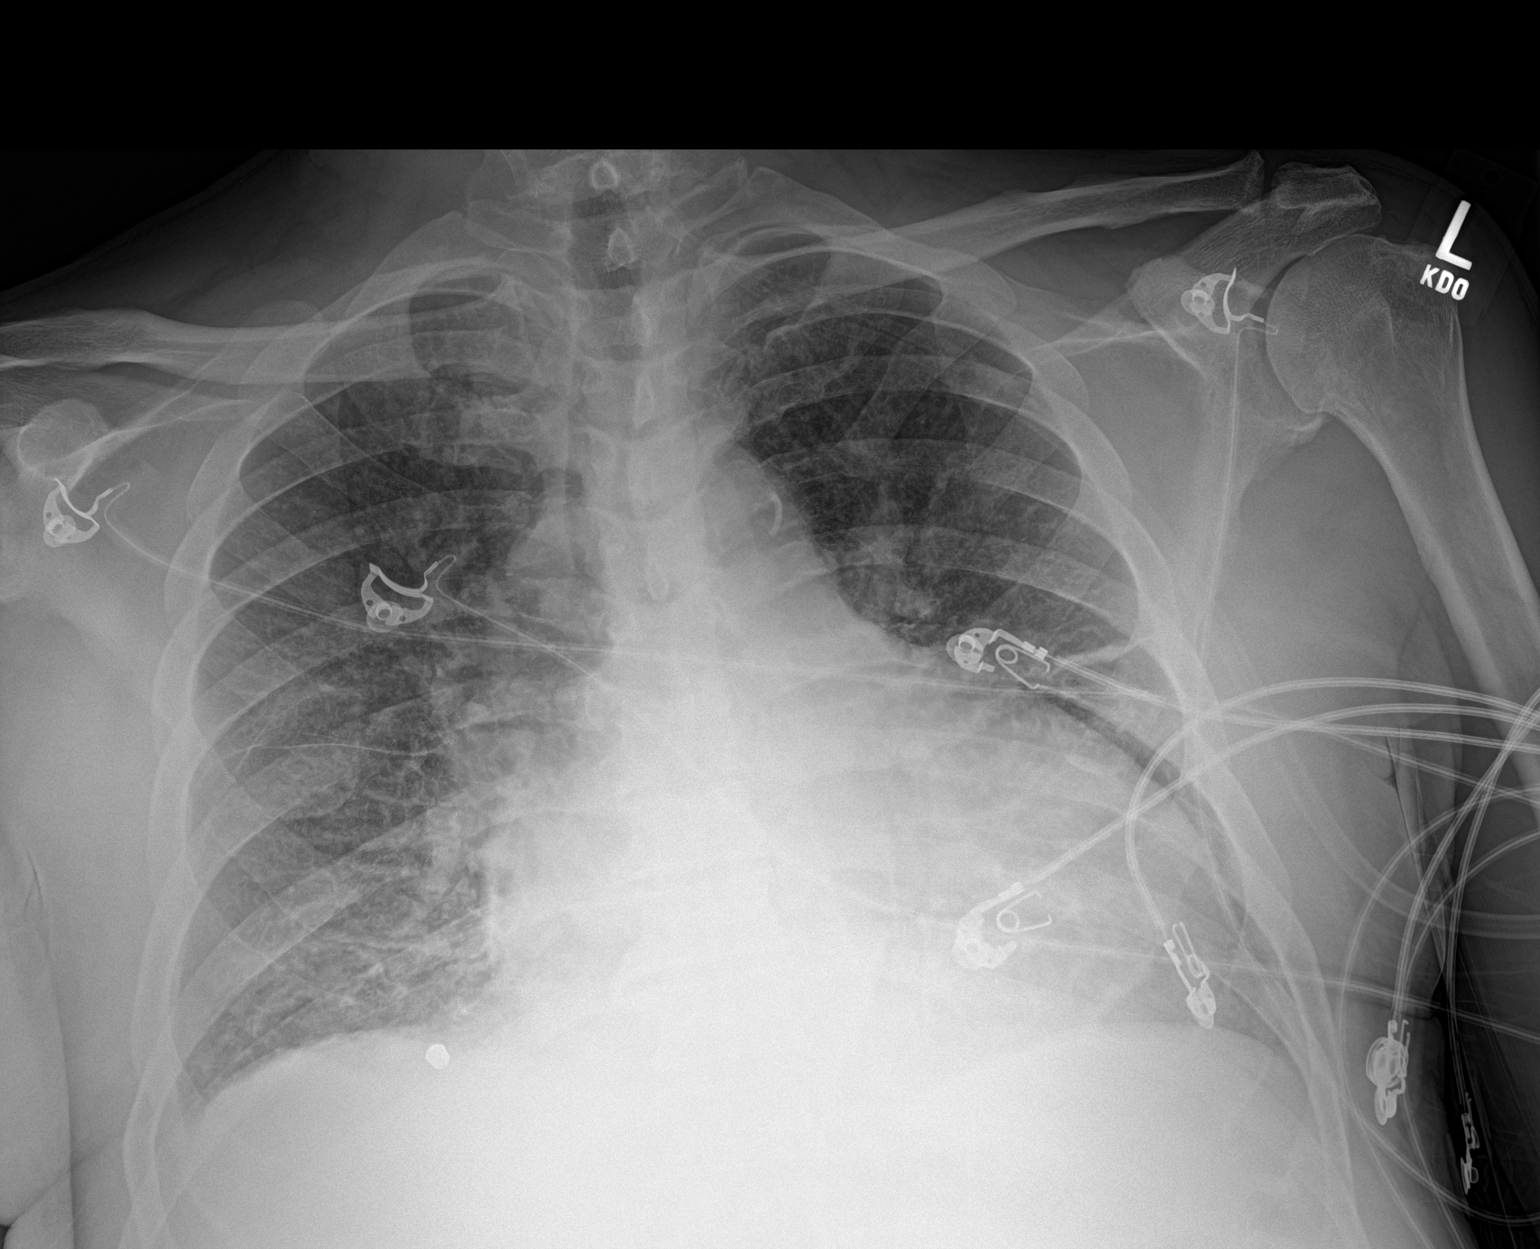

[1 of 1 positions shown; findings below may reference images not displayed]

FINDINGS: Cardiomegaly. Diffuse mild interstitial prominence again noted.
Slight progressed from prior exam. Findings suggest CHF. Pneumonitis
can not be mild left mid lung field subsegmental
atelectasis/scarring. Tiny right pleural effusion. No pneumothorax.
No acute bony abnormality. Degenerative change thoracic spine.
IMPRESSION: Cardiomegaly with diffuse mild interstitial prominence again noted
consistent with CHF. Pneumonitis can not be excluded. There is
slight progression from prior exam. Tiny right pleural effusion.

## 2019-03-31 IMAGING — US ULTRASOUND OF SCROTUM
1 series · 14 of 25 positions shown · non-contrast
Comparison: Scrotal ultrasound dated September 13, 2018.

CLINICAL DATA: Scrotal swelling.

EXAM:
ULTRASOUND OF SCROTUM
TECHNIQUE: Complete ultrasound examination of the testicles, epididymis, and
other scrotal structures was performed.

[Series 1: ultrasound of scrotum · 14 of 51 slices shown]
[im 1/51]
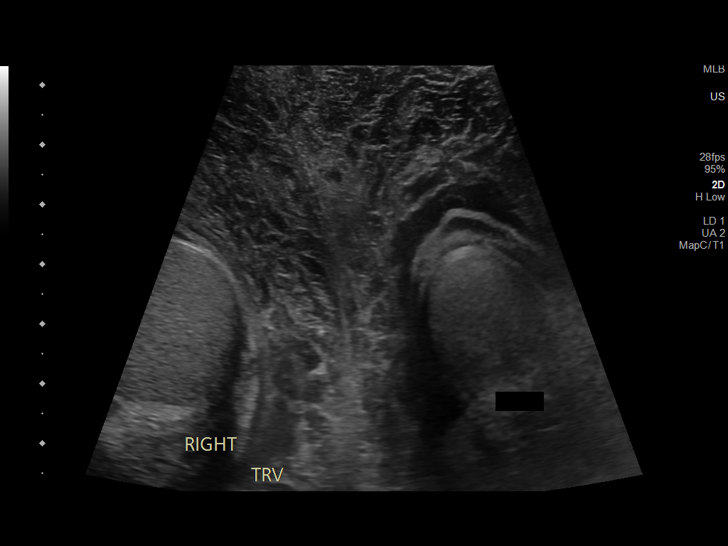
[im 5/51]
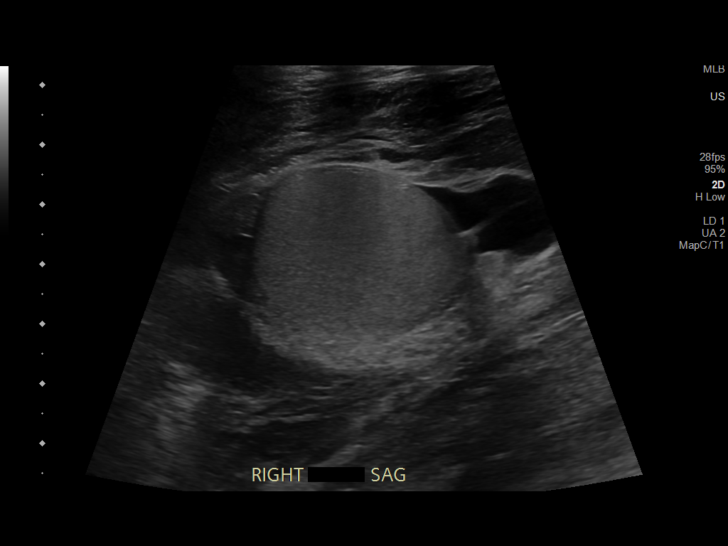
[im 9/51]
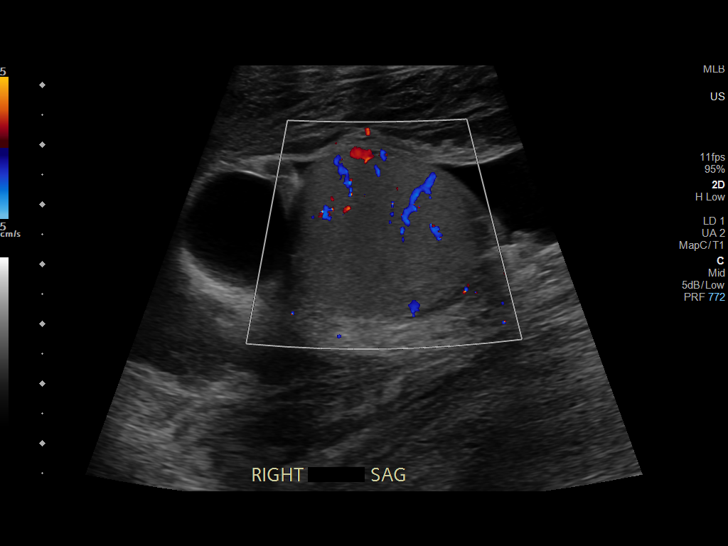
[im 13/51]
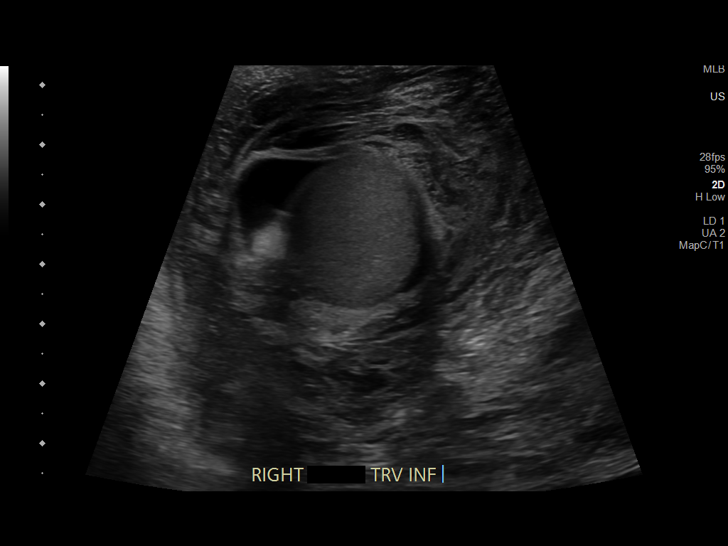
[im 17/51]
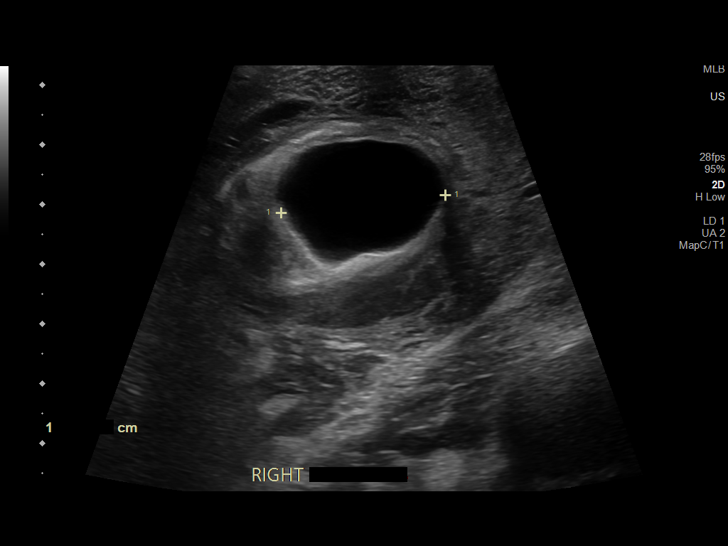
[im 19/51]
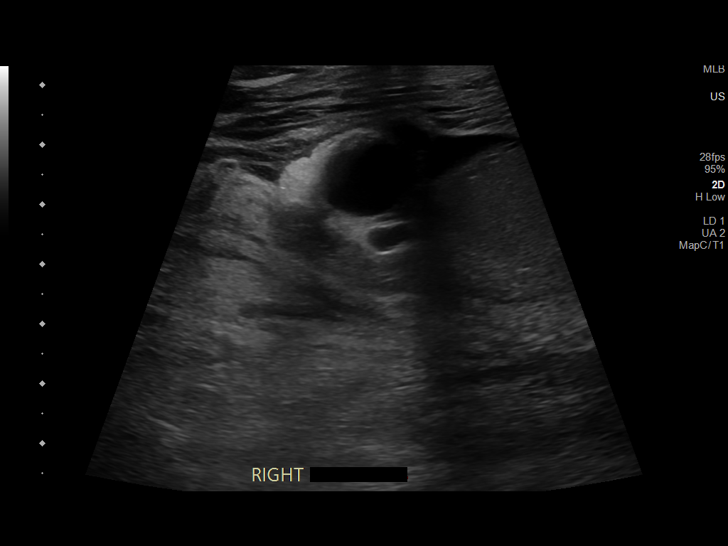
[im 23/51]
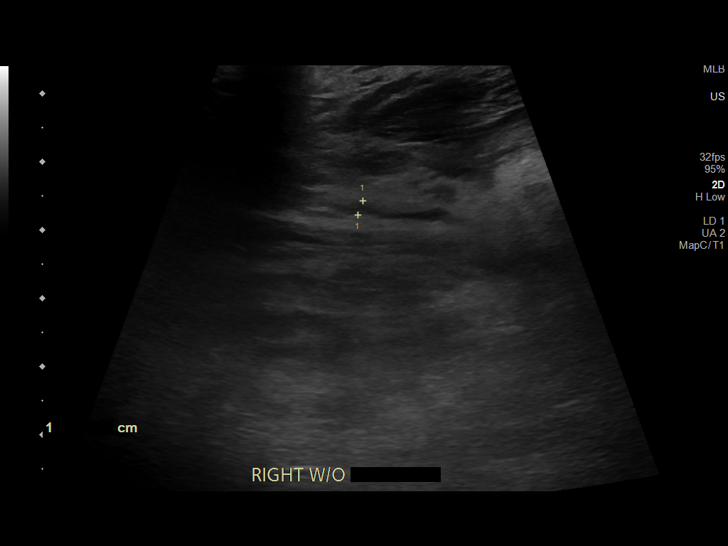
[im 28/51]
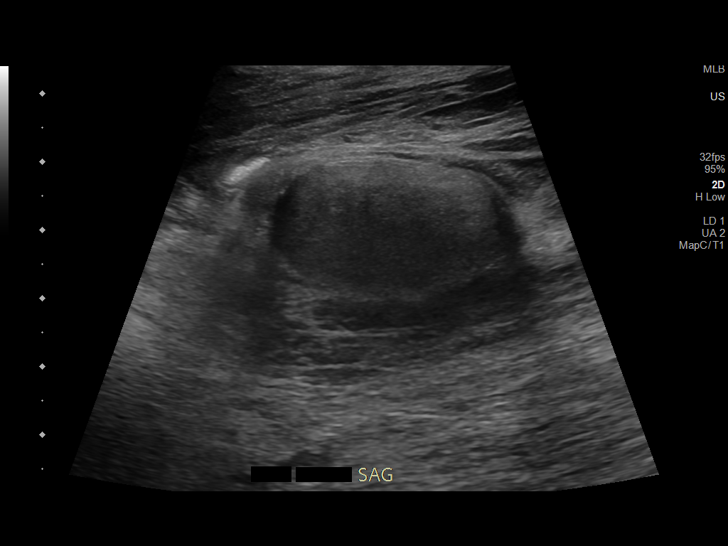
[im 32/51]
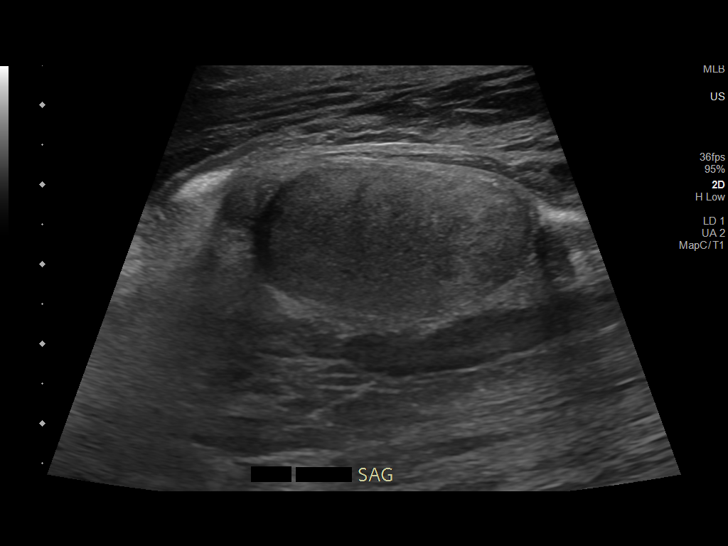
[im 34/51]
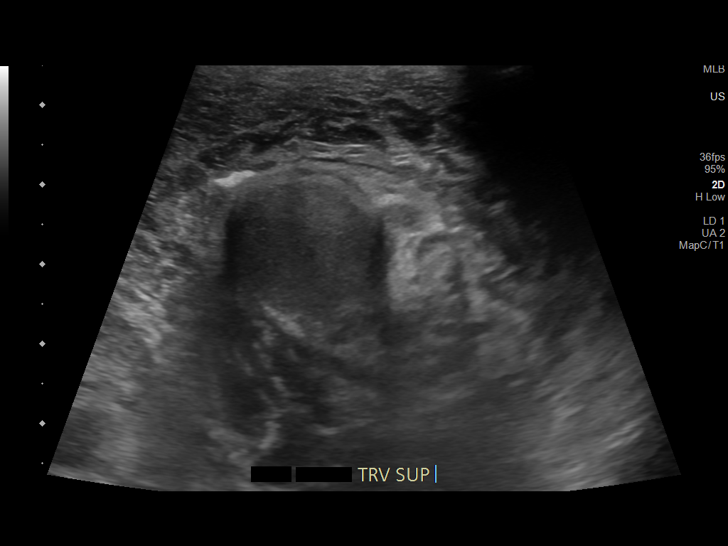
[im 38/51]
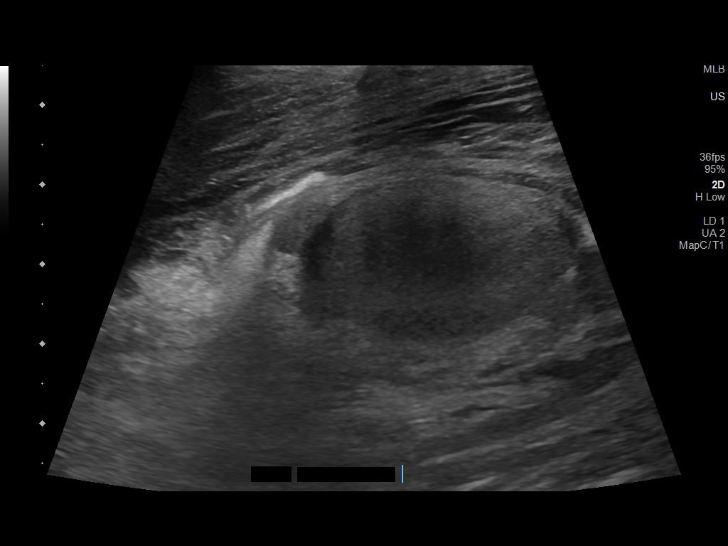
[im 42/51]
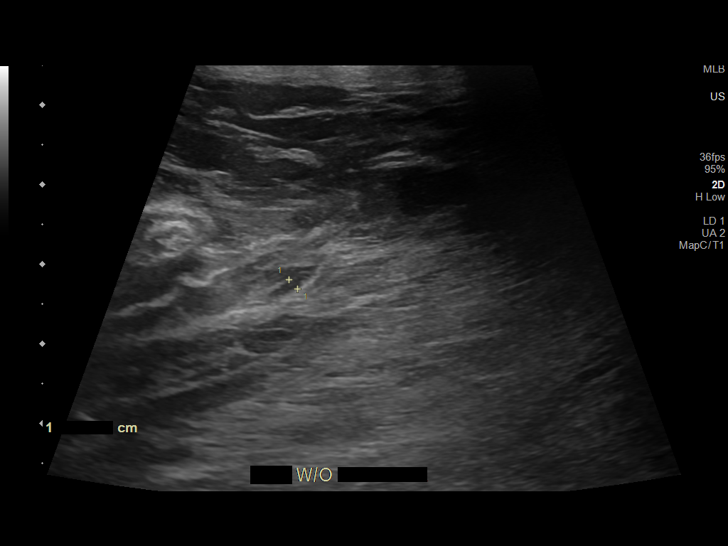
[im 46/51]
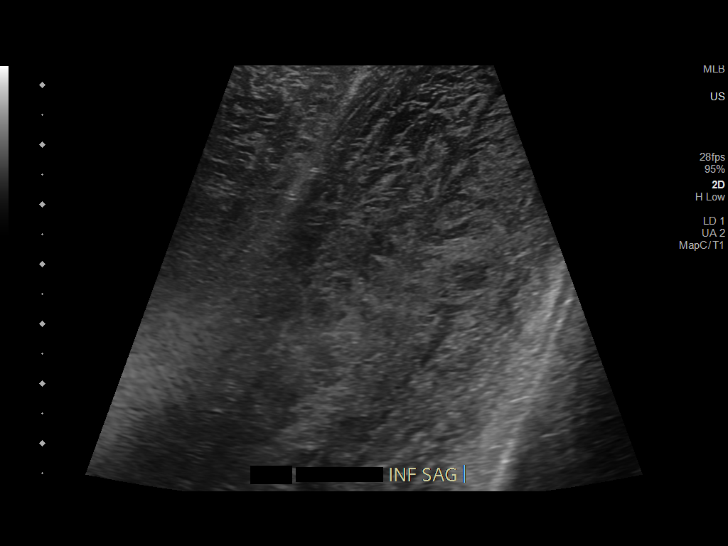
[im 51/51]
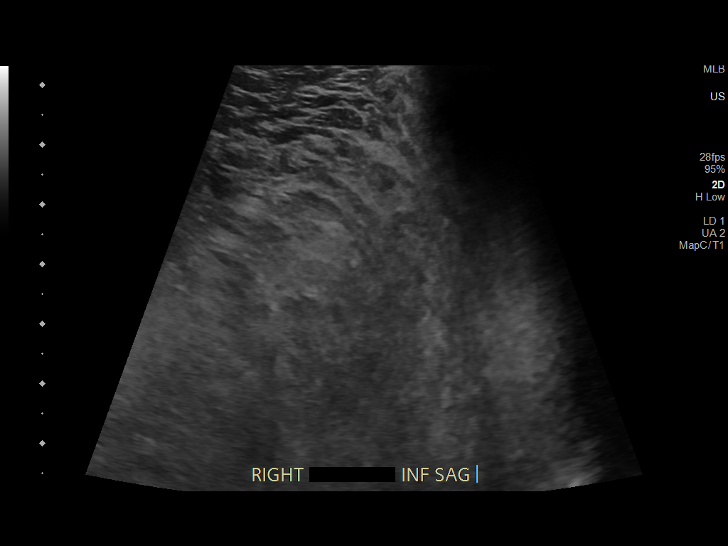

[14 of 25 positions shown; findings below may reference images not displayed]

FINDINGS: Right testicle

Measurements: 3.4 x 2.9 x 2.7 cm. No mass or microlithiasis
visualized.

Left testicle

Measurements: 3.4 x 1.9 x 2.2 cm. Unchanged mild heterogeneity. No
mass or microlithiasis visualized.

Right epididymis:  Unchanged 2.8 cm cyst in the epididymal head.

Left epididymis:  Normal in size and appearance.

Hydrocele:  Unchanged small right hydrocele.

Varicocele:  None visualized.

Unchanged diffuse scrotal edema.
IMPRESSION: 1. Relatively unchanged diffuse scrotal edema. No focal fluid
collection.
2. Unchanged small right hydrocele and right epididymal head cyst.

## 2019-04-01 NOTE — Progress Notes (Signed)
eLink Physician-Brief Progress Note Patient Name: [name] DOB: 18-Sep-1960 MRN: [id number]   Date of Service  03/21/2019  HPI/Events of Note  Hypernatremia - Na+ = 151.   eICU Interventions  Will order: 1. D/C Lasix. 2. 0.45 NaCl to run IV at 75 nL/hour.      Intervention Category Major Interventions: Electrolyte abnormality - evaluation and management  [name] 03/08/2019, 6:33 AM

## 2019-04-01 NOTE — Progress Notes (Signed)
NAME:  [name], MRN:  [id number], DOB:  [birth year], LOS: 39 ADMISSION DATE:  02/04/2019, CONSULTATION DATE: 03/02/2019 REFERRING MD:  Dr. Sander Radon, CHIEF COMPLAINT:  Post CPR resuscitation; acute hypoxemic respiratory failure  Brief History   58 year old homeless man with  hypertension, polysubstance abuse, HFrEF.  Admitted with mechanical fall secondary to cocaine use. He has a known history of noncompliance with prescribed medications, multiple hospitalizations and documented history of leaving AMA.  On 9/1 patient went into asystolic in-hospital arrest presumed secondary to acute pulmonary edema. ROSC achieved in 14 mins. TTM with rewarming complete on the evening of 9/2.  Past Medical History  Homelessness, polysubstance abuse, cocaine abuse, schizophrenia, sleep apnea, CHF, diabetes, hypertension, chronic foot pain  Significant Hospital Events   8/27- Admitted after mechanical fall 9/1- Asystolic arrest, hypothermia protocol. 9/2 seizure activity on EEG, rewarmed  Consults:  PCCM  Procedures:  OETT 9/1 Left IJ CVL 9/1  Significant Diagnostic Tests:  CT head (9/1): No acute intracranial abnormality  2D echo (11/07/2018): LVEF 25-30%, mildly dilated, moderate concentric LVH.  Diffuse LV hypokinesis.  Diastolic dysfunction.  RV volume and pressure overload.  LA moderately dilated.  RA severely dilated.  Small pericardial effusion.  Urine drug screen 8/28- positive for cocaine  CT angiogram chest 9/1 no pulmonary embolism, moderate left more than right effusions, anasarca  EEG 9/1  Intermittent polyspikes were seen, either generalized or in right frontocentral region epileptogenicity which is likely generalized or arising from right frontocentral region.  No clear seizures were seen   Echo 9/1 EF 35-40%, RVSP 38 mm LTM EEG 9/2 >> (on versed )  generalized spikes with overriding fast activity at 1-1.5Hz . No clear seizure  MRI brain 9/4- diffuse cerebral  anoxic injury  CXR 9/4- b/l layering pleural effusions  Micro Data:  SARS-CoV-2 8/28-negative.  He has at least 20 negative tests since May 2020  resp cx 9/1 >> nml flora, final bld 9/1 >> NG final  Antimicrobials:  Vancomycin 9/1 > 9/3 Zosyn 9/1 >  Interim history/subjective:  Transient hypertension overnight, transiently started on nicardipine, now off.  Blood pressure appears to respond well to carvedilol 0.45 Normal saline started overnight, Na 151 No neurological changes reported.  No seizure activity but currently on Versed 10 infusion  Objective   Blood pressure 117/61, pulse 81, temperature 99.7 F (37.6 C), resp. rate (!) 0, height 6' (1.829 m), weight 90.5 kg, SpO2 97 %. CVP:  [6 mmHg-20 mmHg] 6 mmHg  Vent Mode: PSV;CPAP FiO2 (%):  [40 %] 40 % Set Rate:  [10 bmp] 10 bmp Vt Set:  [620 mL] 620 mL PEEP:  [5 cmH20] 5 cmH20 Pressure Support:  [10 cmH20] 10 cmH20 Plateau Pressure:  [17 cmH20-19 cmH20] 18 cmH20   Intake/Output Summary (Last 24 hours) at 03/21/2019 0901 Last data filed at 03-21-2019 0800 Gross per 24 hour  Intake 2076.11 ml  Output 5785 ml  Net -3708.89 ml   Filed Weights   03/07/19 0100 03/08/19 0500 03/21/19 0500  Weight: 101.6 kg 94.7 kg 90.5 kg    Examination: Gen:   Ill-appearing elderly man, ventilated, in no distress HEENT: ET tube in place, oropharynx otherwise clear Neck:   Left IJ CVC in place Lungs:    Distant, clear bilaterally.  Currently on PS 10 CV:      Regular, no murmur Abd:   Obese, soft, nondistended, positive bowel sounds Ext: Chronic brawny changes bilateral lower extremities, no edema GU:  Foley in place, penile  ulcer bandaged. Skin:    No rash Neuro:   On deep sedation with Versed.  Pupils do react.  He does have a spontaneous respiratory drive, currently tolerating PS.  Does not open eyes, move to pain or voice.  No cough or gag  Resolved Hospital Problem list    Oozing from IJ line Thrombin pad  Assessment & Plan:   Briefly this is a 58 year old with history of CHF, cocaine abuse transferred to ICU after asystolic cardiac arrest  Cardiac arrest- asystole.  Presumed secondary to acute on chronic HFrEF in the setting of cocaine use, acute pulm edema. Chronic systolic heart failure.  Hemodynamically improved but neurologic prognosis very poor at this point -Tolerating carvedilol, losartan added on 9/7 -Nicardipine to off -Based on family discussions to date, now also including patient's mother who has arrived from [place], plan is to transition to compassionate withdrawal of care and extubation.  For now I think it is reasonable to continue Versed, may be able to wean some but do not want to precipitate either myoclonus or seizures.  Suspect family will be ready for extubation on 9/9   Acute hypoxic respiratory failure following cardiac arrest.  Acute pulmonary edema.  Bilateral pleural effusions.  -Not a candidate for extubation due to mental status -Agree with withdrawal, do not favor tracheostomy, prolonged support  Hypertension -Carvedilol -Continue losartan, tolerating -Hydralazine if needed -Stop nicardipine  Wound at base of penis -does not appear infected -Appreciate wound care assistance  Diabetes mellitus -Given overall goals for care, presumed transition to comfort soon I will stop his CBG and insulin injections 9/8  Acute encephalopathy post- arrest with severe anoxic brain injury on MRI and seizures. MRI confirms diffuse injury. EEG demonstrating seizures, which portends a very poor prognosis. -Discussions regarding neurological prognosis and goals for care as described above. -Continue Keppra, valproate, Tegretol -May be able to wean Versed infusion to some degree.  Goal continue to control myoclonus  AKI post- arrest, recovered -Minimize labs, follow urine output  Tobacco abuse -d/c nicotine patch   Best practice:  Diet: NPO , TFs Pain/Anxiety/Delirium protocol (if indicated):  Ordered VAP protocol (if indicated): Ordered DVT prophylaxis: Hep SQ GI prophylaxis: stop PPI 9/8 Glucose control: Stop SSI, CBG on 9/8 Mobility: Bed Code Status: Full Family Communication: Note good discussions between multiple family members including patient's mother and palliative care on 9/7.  Withdrawal of care deferred on 9/8, better for family on 9/9 Disposition: ICU   Independent CC time 32 minutes  Levy Pupaobert Velna Hedgecock, MD, PhD 03/05/2019, 9:26 AM [facility] [phone] or if no answer [phone]

## 2019-04-01 NOTE — Progress Notes (Signed)
Assisted tele visit to patient with family member.  Jaylianna Tatlock R, RN  

## 2019-04-01 NOTE — Progress Notes (Signed)
Pt asystolic. No heart sounds on ascultation; pt expired 03-27-19 at 1356. Pronounced by Griffin Dakin, RN and Montey Hora, RN

## 2019-04-01 NOTE — Progress Notes (Signed)
Patient ID: [name], male   DOB: [birth year], 58 y.o.   MRN: [id number]  This NP visited patient at the bedside as a follow up fpr palliaitve medcien needs and emotional support  Plan was put in place yesterday with the patient's mother and father and 2 sons who live local for a one-way extubation and shift to comfort measures. This morning there was some discussion about waiting until tomorrow for one way extubate but son/[name]  clarified he does not want to wait, "I do not want my father to suffer any longer than he has to" and both the patient's mother and father agree to support still work.  Both the patient's mother and the patient's son/[name] arrived at the bedside and was able to visit prior to compassionate wean.  All family agree the focus of care is comfort, quality and dignity at this time. Understand the limited prognosis, likely hours to days.  Discussed briefly with the patient's son the process of compassionate wean, discussed natural trajectory and expectations of end-of-life and emotional support offered.Questions and concerns addressed     Orders for terminal wean and comfort measures placed, discussed with Haley/bedside RN.  Total time spent on the unit was 60  Minutes    Palliative medicine will continue to support holistically  Greater than 50% of the time was spent in counseling and coordination of care  Wadie Lessen NP  Palliative Medicine Team Team Phone # [phone] Pager [phone]

## 2019-04-01 NOTE — Procedures (Signed)
Extubation Procedure Note  Patient Details:   Name: [name] DOB: 1960-07-05 MRN: [id number]   Airway Documentation:    Vent end date: 04-05-19 Vent end time: 1235   Evaluation  O2 sats: stable throughout Complications: No apparent complications Patient did tolerate procedure well. Bilateral Breath Sounds: Diminished, Rhonchi   No   Pt was extubated to comfort care and room air per order and family request. Pt was suctioned prior to extubation and had a cuff leak. Pt was unable to tell RT his name. Will continue to monitor.   Sherine Cortese A Jacquelynne Guedes 2019-04-05, 12:38 PM

## 2019-04-01 DEATH — deceased

## 2019-04-03 IMAGING — DX PORTABLE CHEST - 1 VIEW
1 series · 1 of 1 positions shown · non-contrast
Comparison: Prior radiograph from 09/17/2018

CLINICAL DATA: Initial evaluation for acute shortness of breath.

EXAM:
PORTABLE CHEST 1 VIEW

[chest]
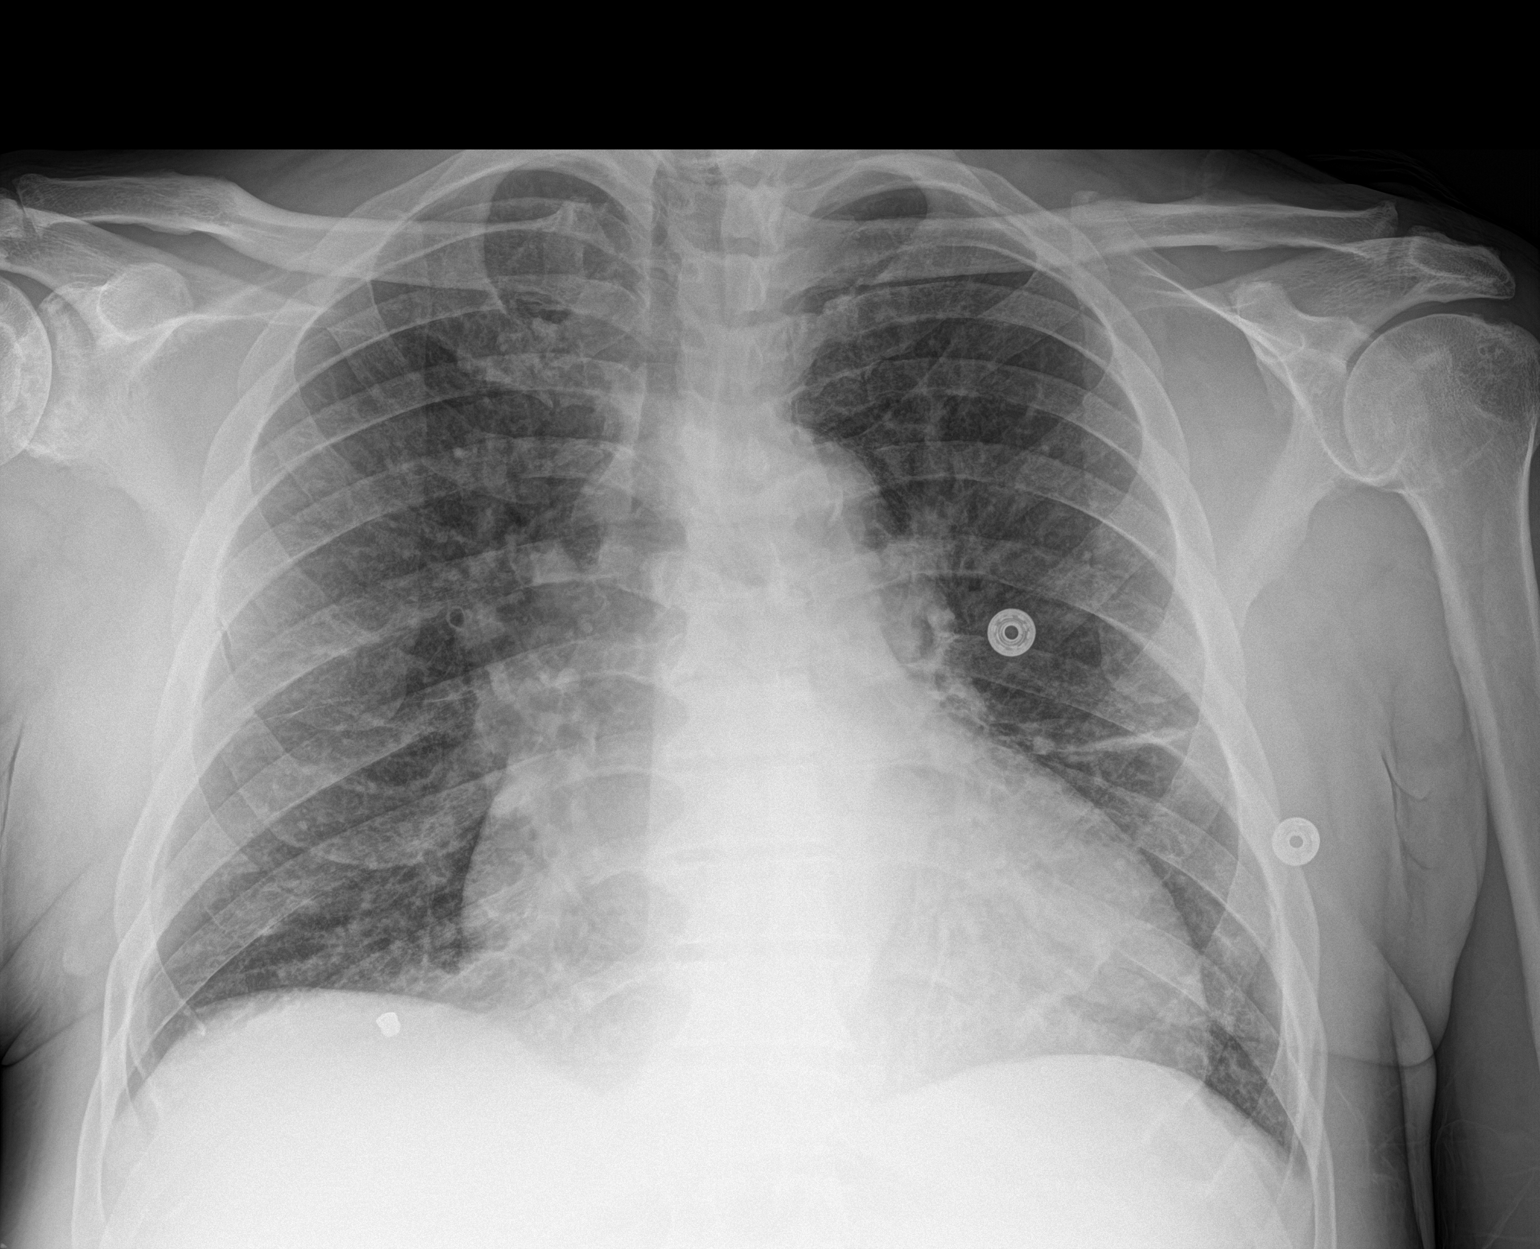

[1 of 1 positions shown; findings below may reference images not displayed]

FINDINGS: Moderate cardiomegaly, stable. Mediastinal silhouette within normal
limits.

Lungs normally inflated. Mild diffuse pulmonary interstitial
congestion/edema. Superimposed left perihilar scarring, similar to
previous. No focal infiltrates. No pneumothorax. No visible pleural
effusion.

No acute osseous finding. Degenerative changes noted about the
partially visualized right shoulder.
IMPRESSION: 1. Cardiomegaly with mild diffuse pulmonary interstitial
congestion/edema.
2. Superimposed left perihilar scarring.

## 2019-04-04 IMAGING — DX CHEST - 2 VIEW
2 series · 2 of 2 positions shown · non-contrast
Comparison: 09/20/2018

CLINICAL DATA: Chest pain, groin swelling, suicidal ideation

EXAM:
CHEST - 2 VIEW

[x chest ap]
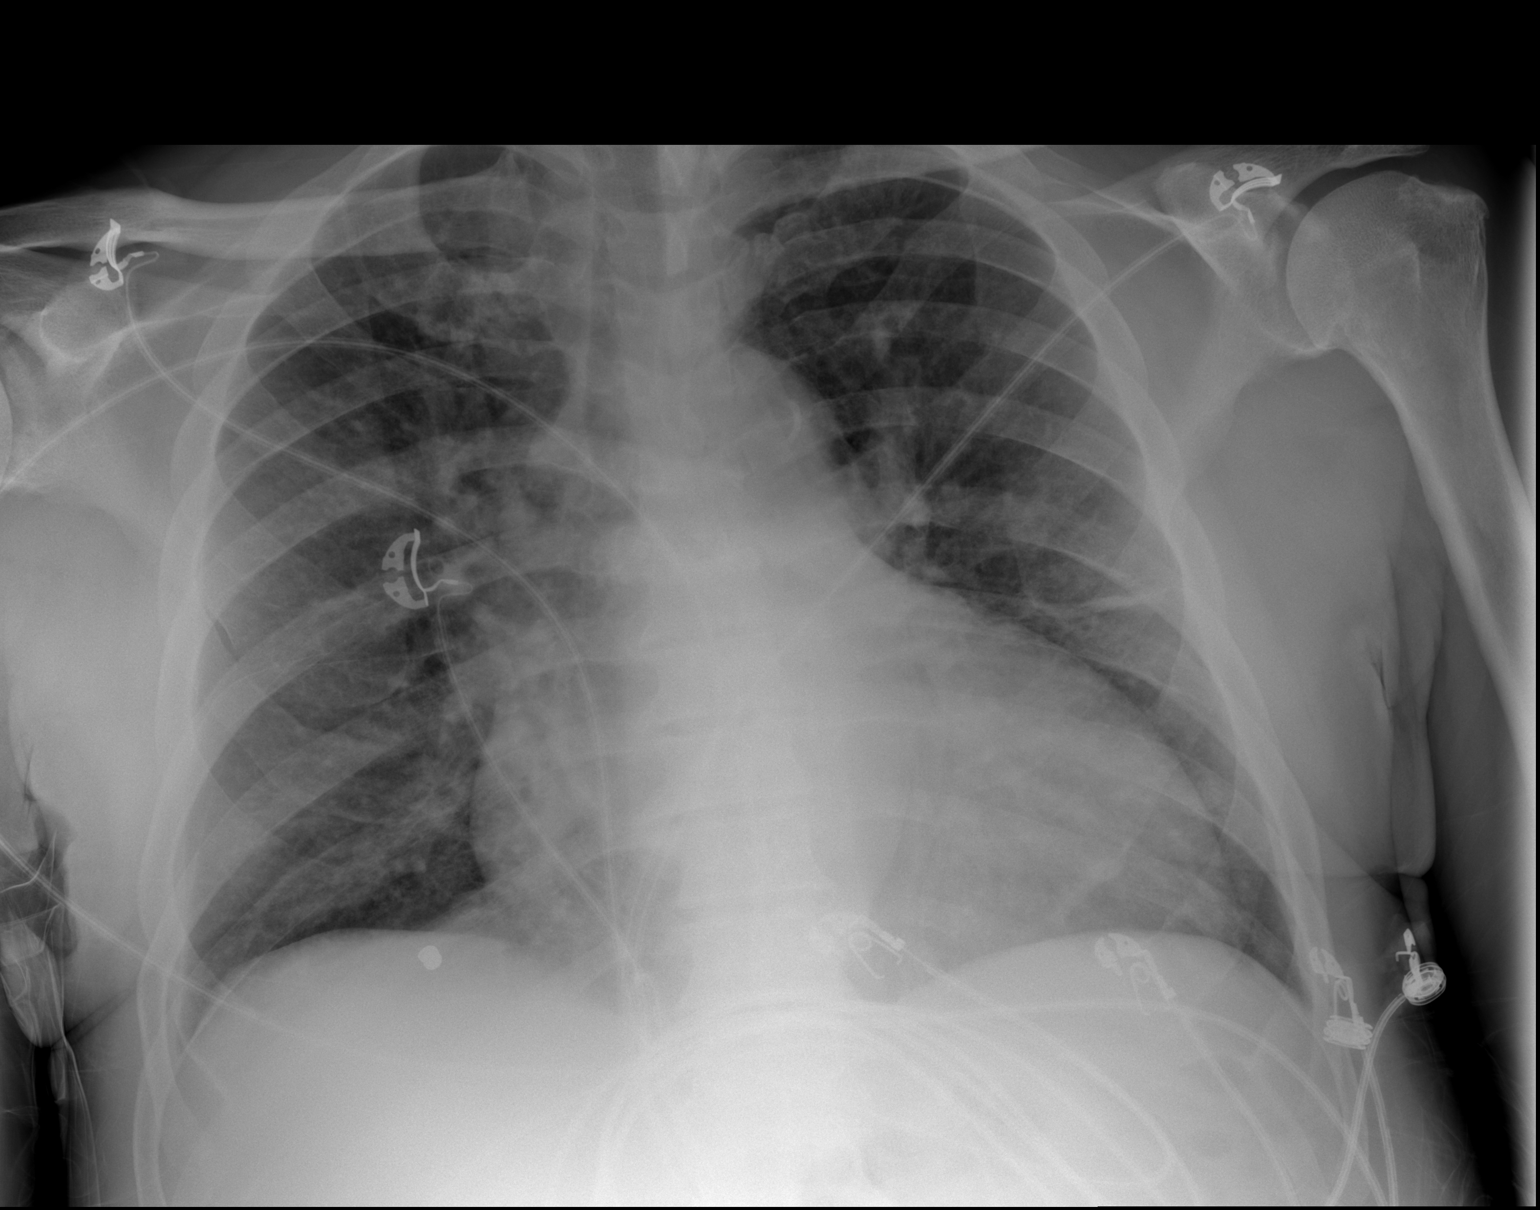

[w chest lat]
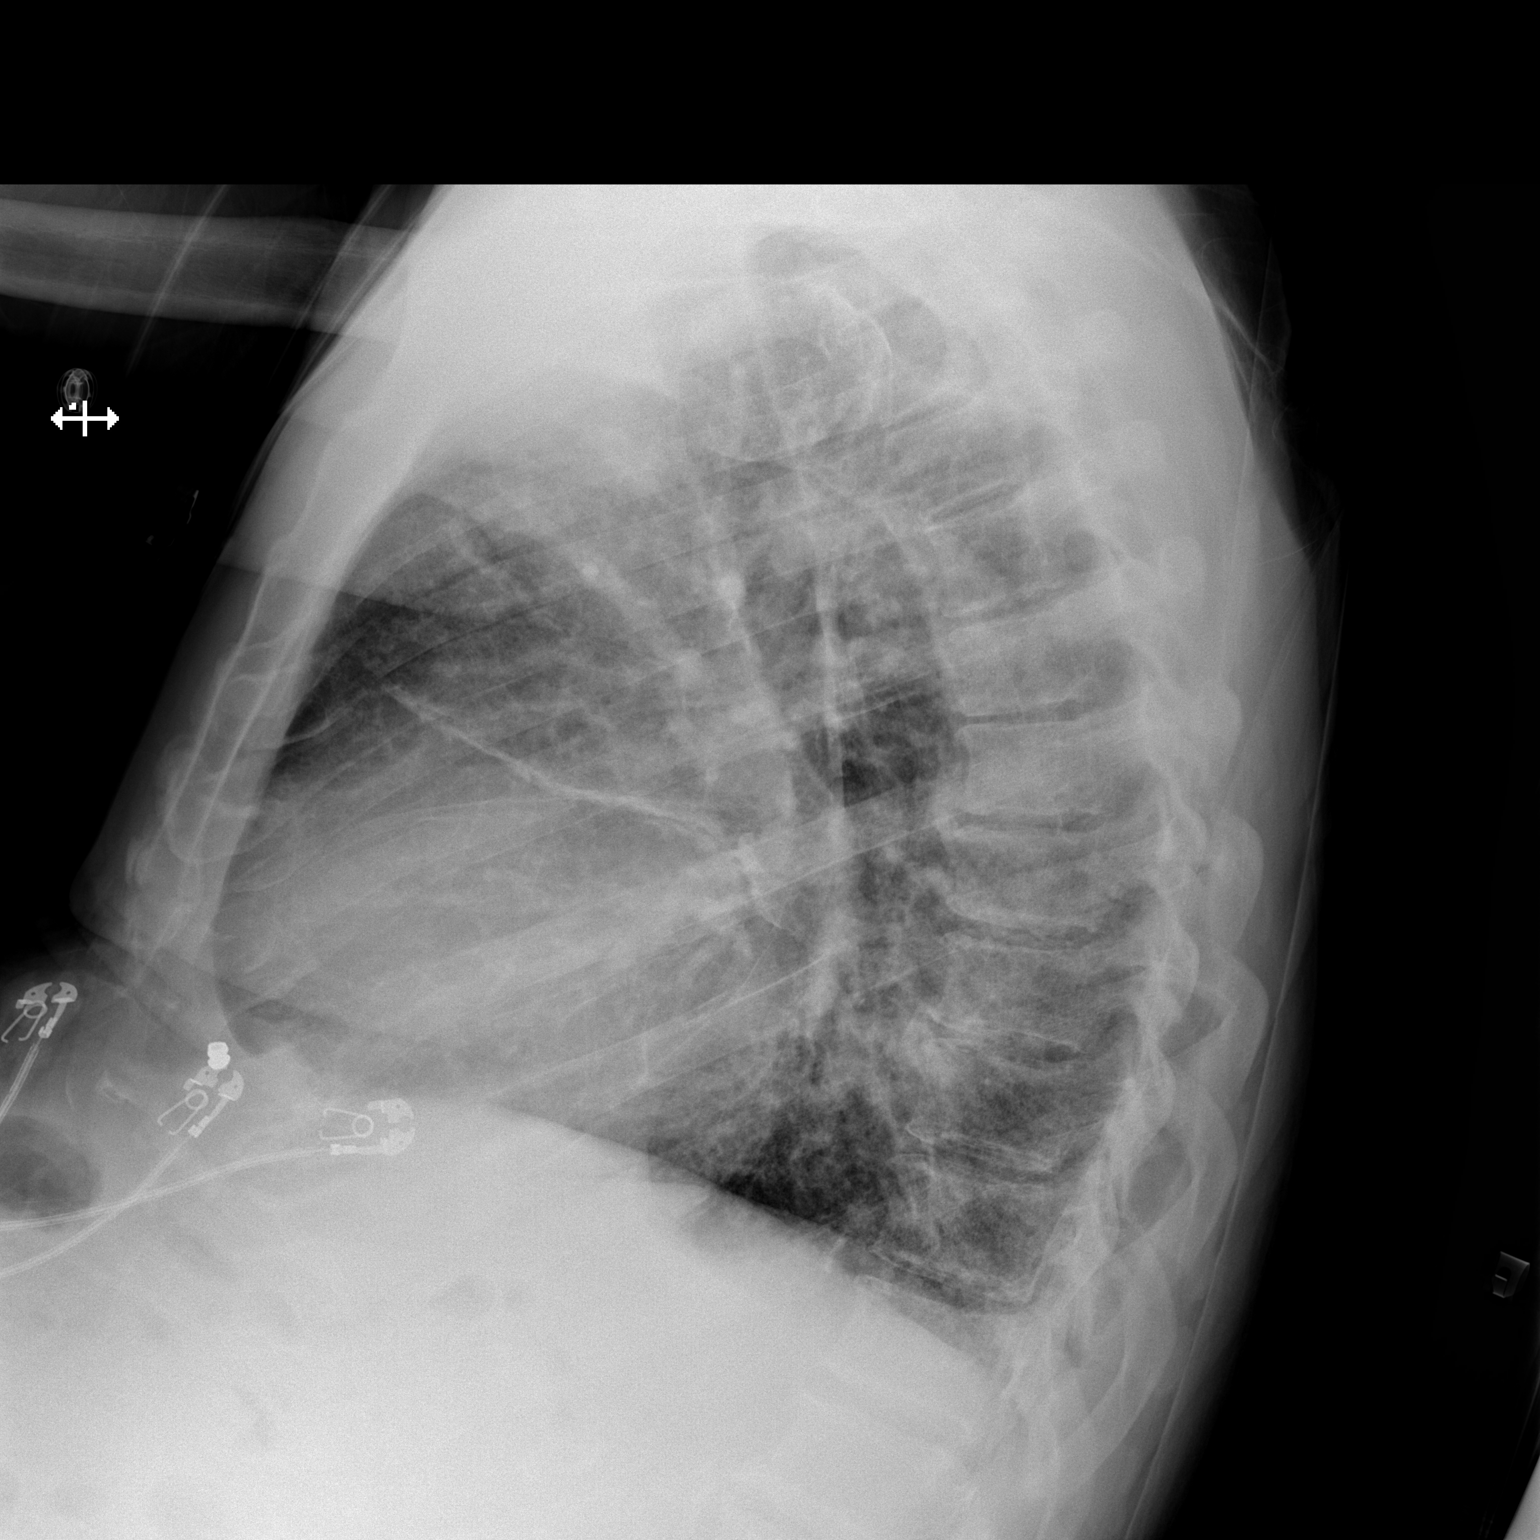

[2 of 2 positions shown; findings below may reference images not displayed]

FINDINGS: Linear scarring in the lingula. Lungs otherwise clear. No pleural
effusion or pneumothorax.

Cardiomegaly.

Mild degenerative changes of the visualized thoracolumbar spine.
IMPRESSION: No evidence of acute cardiopulmonary disease.

## 2019-04-10 DIAGNOSIS — R569 Unspecified convulsions: Secondary | ICD-10-CM

## 2019-04-15 IMAGING — CR CHEST - 2 VIEW
2 series · 2 of 2 positions shown · non-contrast
Comparison: 09/21/2018

CLINICAL DATA: Cough, abdominal pain

EXAM:
CHEST - 2 VIEW

[w chest lat]
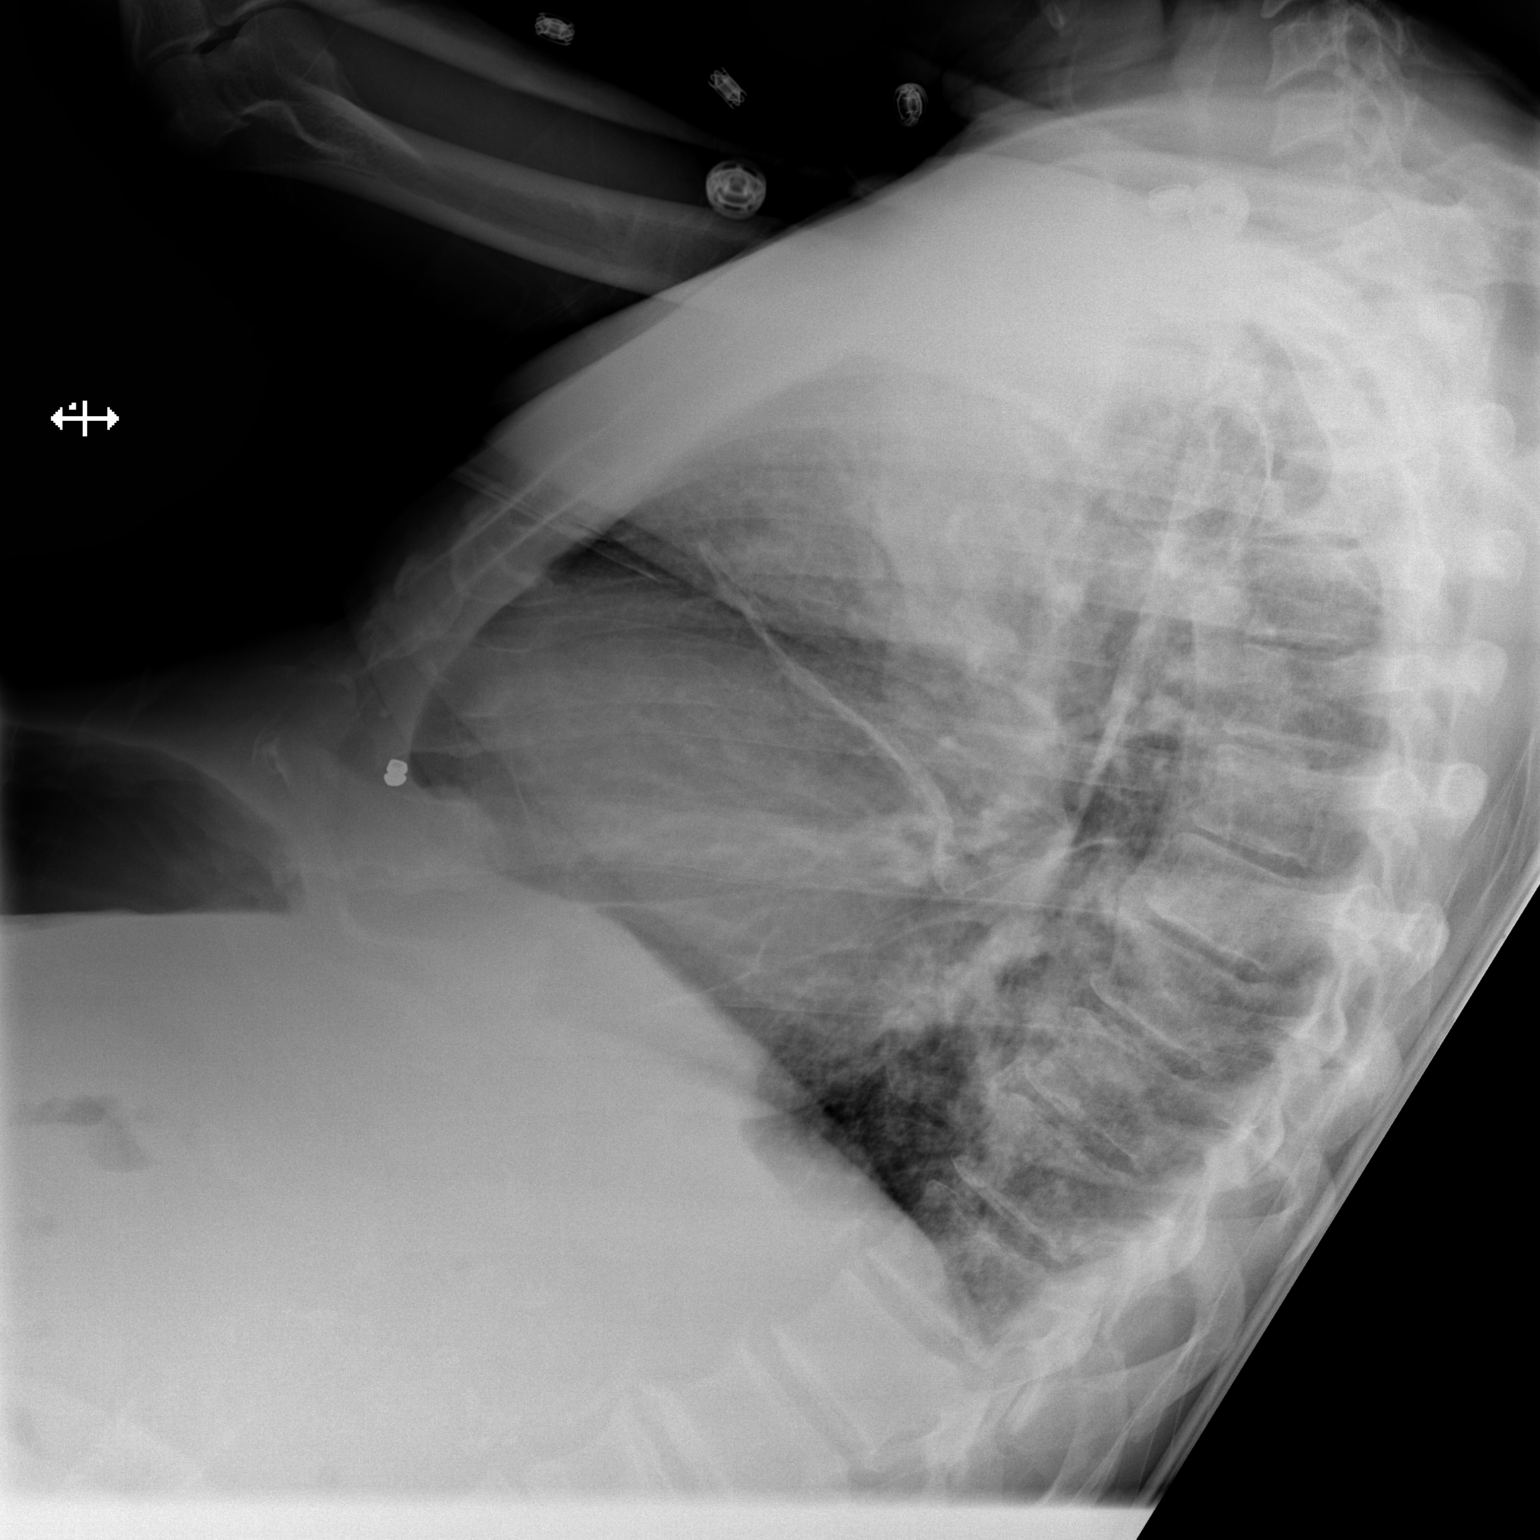

[x chest ap]
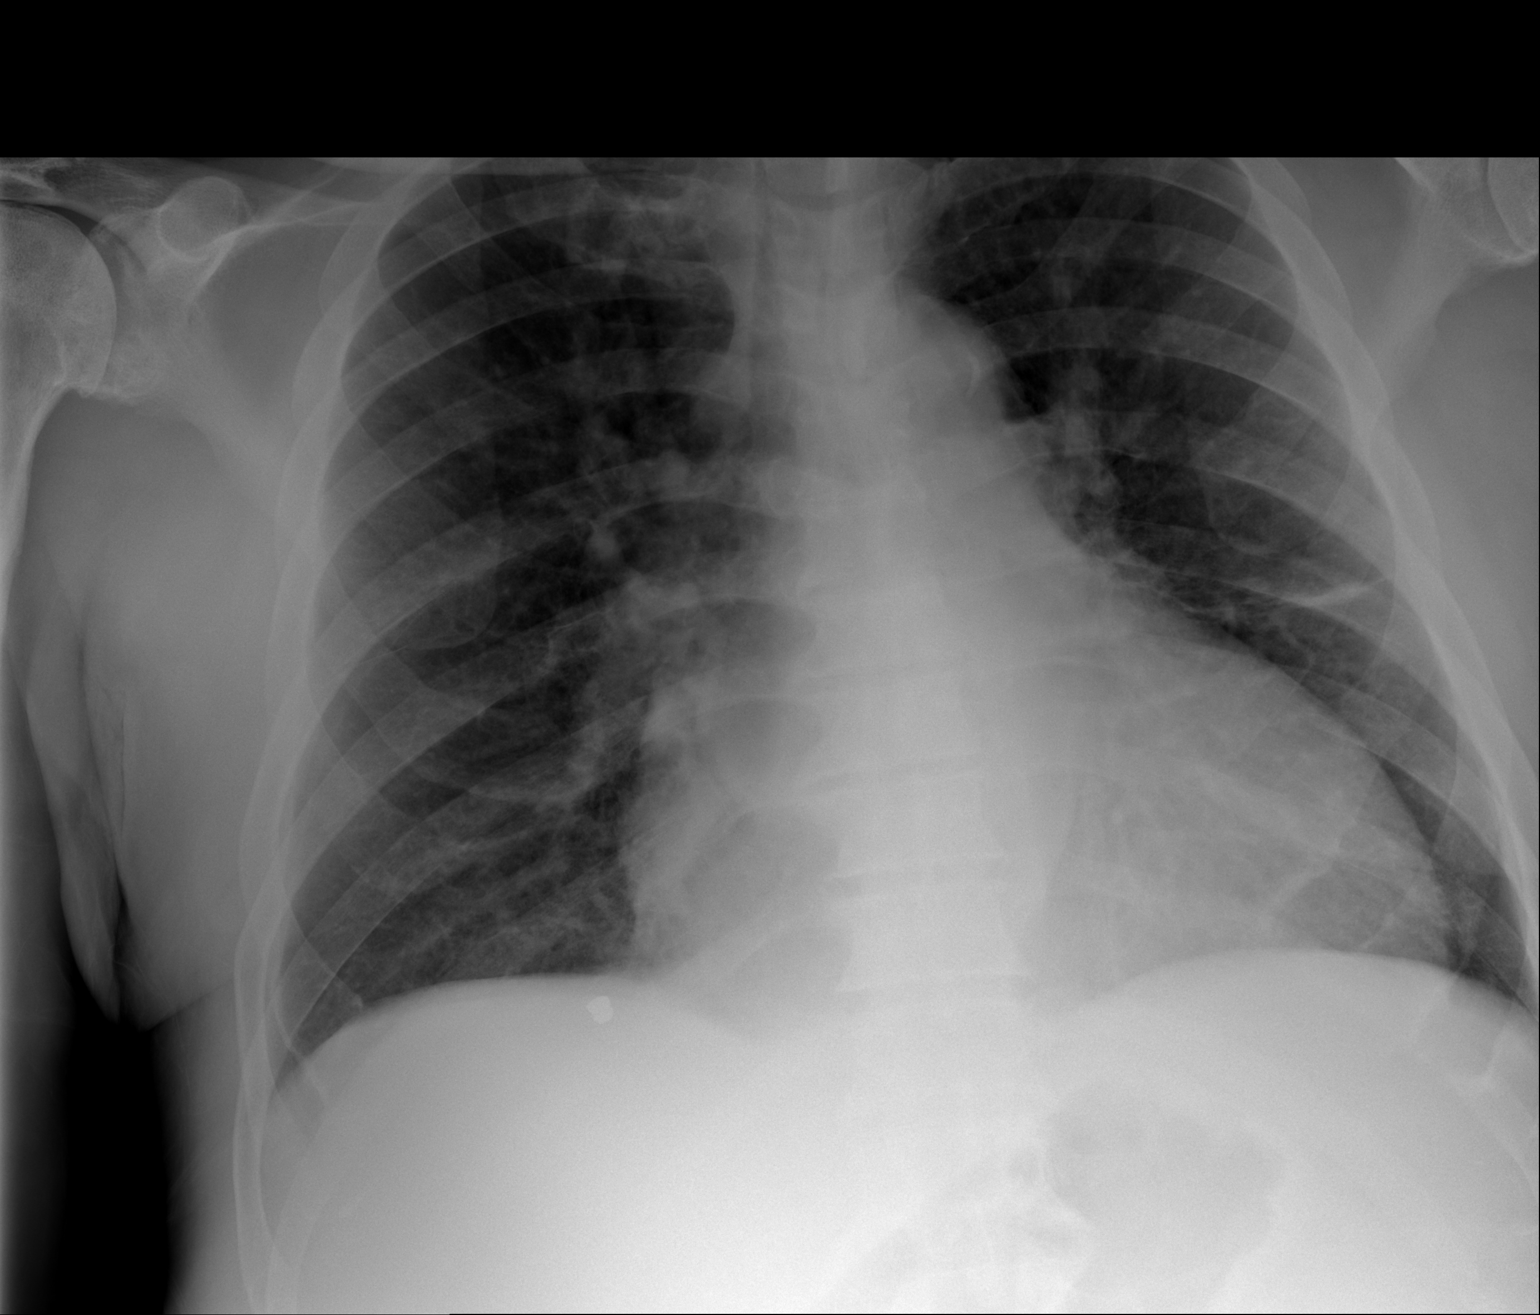

[2 of 2 positions shown; findings below may reference images not displayed]

FINDINGS: Lingular scarring. No focal consolidation. No pleural effusion or
pneumothorax.

Cardiomegaly.

Degenerative changes of the visualized thoracolumbar spine.
IMPRESSION: Normal chest radiographs.

## 2019-04-16 IMAGING — DX PORTABLE CHEST - 1 VIEW
1 series · 1 of 1 positions shown · non-contrast
Comparison: 10/02/2018.  Multiple prior chest radiographs.

CLINICAL DATA: Swelling of the lower half of the body. Shortness of
breath and cough.

EXAM:
PORTABLE CHEST 1 VIEW

[chest ap]
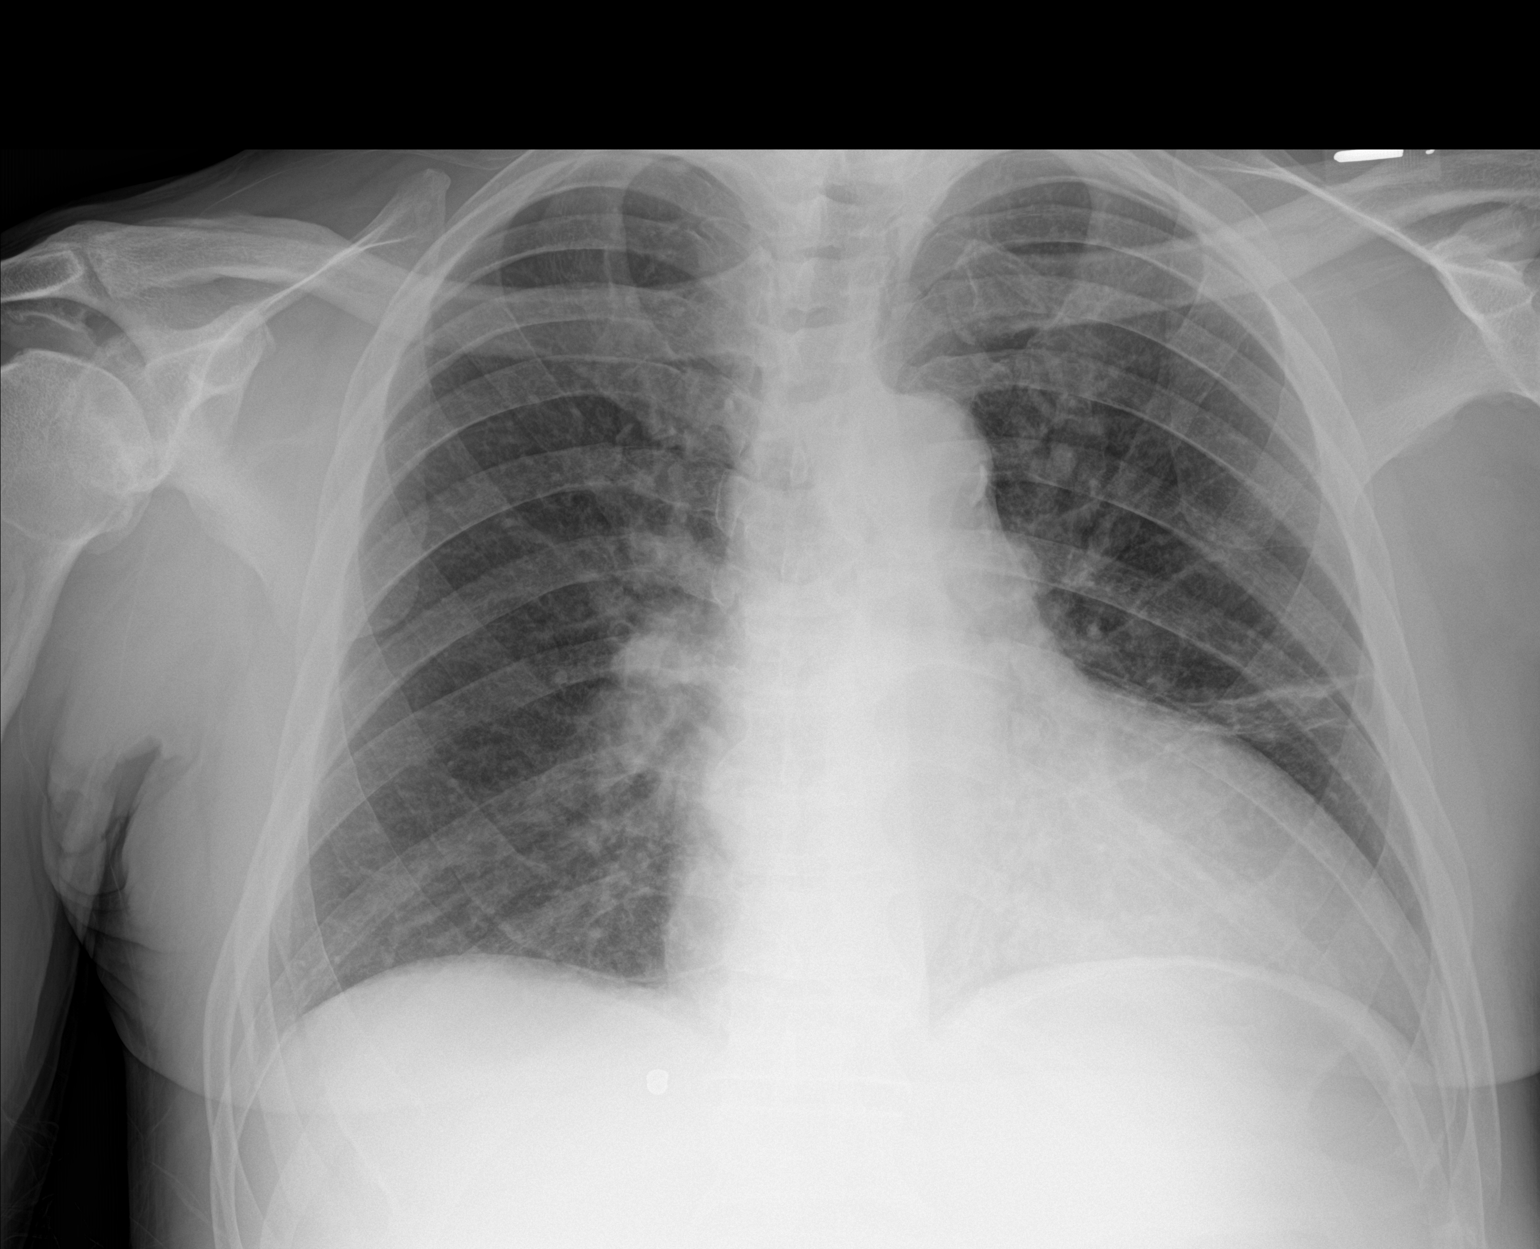

[1 of 1 positions shown; findings below may reference images not displayed]

FINDINGS: Chronic cardiomegaly with left ventricular prominence. Chronic
aortic atherosclerosis. Chronic pulmonary scarring in the left mid
lung. No evidence of heart failure or effusion. No active pulmonary
process.
IMPRESSION: No active disease. Chronic cardiomegaly, aortic atherosclerosis and
left mid lung scarring.

## 2019-04-21 LAB — BLOOD GAS, VENOUS
Acid-Base Excess: 4.1 mmol/L — ABNORMAL HIGH (ref 0.0–2.0)
Bicarbonate: 30.7 mmol/L — ABNORMAL HIGH (ref 20.0–28.0)
O2 Content: 2 L/min
O2 Saturation: 21.7 %
Patient temperature: 98.6
pCO2, Ven: 62 mmHg — ABNORMAL HIGH (ref 44.0–60.0)
pH, Ven: 7.316 (ref 7.250–7.430)

## 2019-04-22 IMAGING — DX PORTABLE CHEST - 1 VIEW
1 series · 1 of 1 positions shown · non-contrast
Comparison: Prior radiograph from 10/03/2018

CLINICAL DATA: Initial evaluation for acute scrotal pain and dental
pain, edema.

EXAM:
PORTABLE CHEST 1 VIEW

[chest ap]
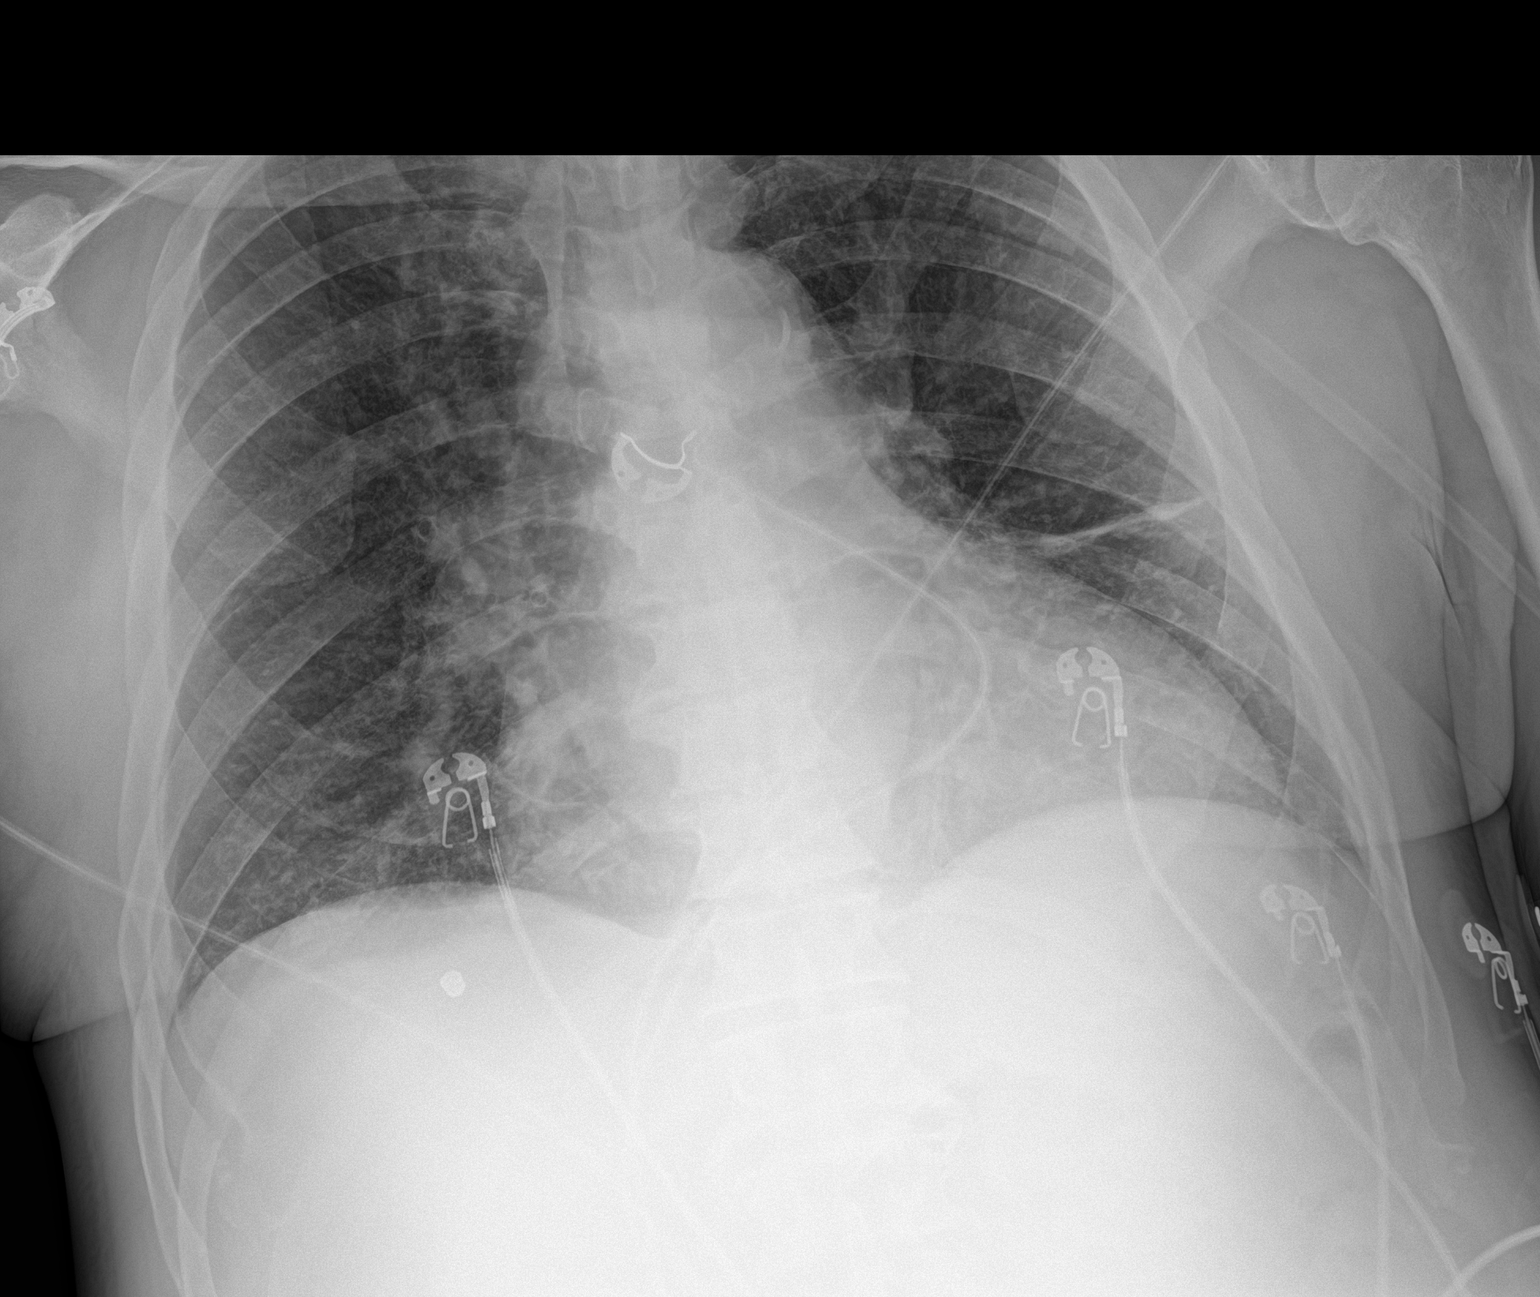

[1 of 1 positions shown; findings below may reference images not displayed]

FINDINGS: Moderate cardiomegaly, stable. Mediastinal silhouette within normal
limits. Aortic atherosclerosis.

Lungs normally inflated. Linear atelectasis/scarring present within
the left perihilar region, stable. Mild diffuse pulmonary
interstitial congestion without overt pulmonary edema. No pleural
effusion. No consolidative opacity. No pneumothorax.

No acute osseous finding. Metallic density overlies the right upper
quadrant, stable.
IMPRESSION: 1. Stable cardiomegaly with mild diffuse pulmonary interstitial
congestion without overt pulmonary edema.
2. Aortic atherosclerosis.

## 2019-04-30 IMAGING — DX PORTABLE CHEST - 1 VIEW
1 series · 1 of 1 positions shown · non-contrast
Comparison: 10/09/2018

CLINICAL DATA: Shortness of breath.

EXAM:
PORTABLE CHEST 1 VIEW

[chest ap]
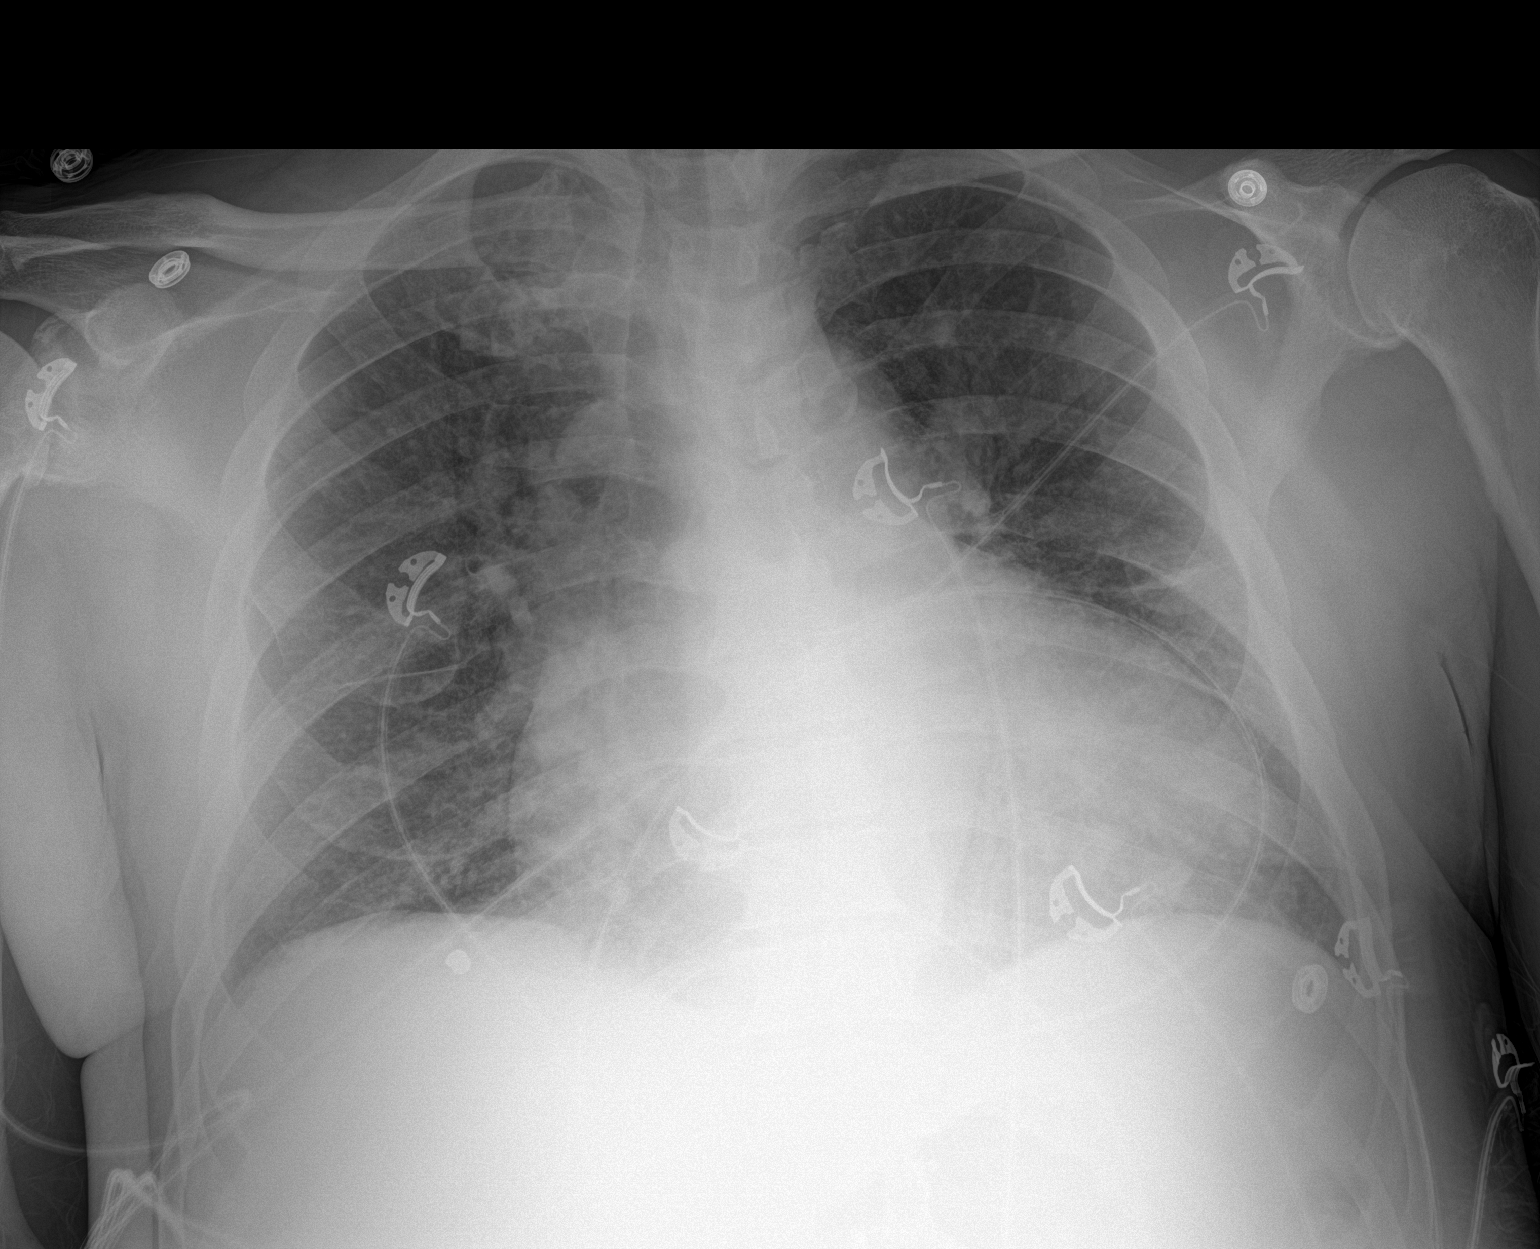

[1 of 1 positions shown; findings below may reference images not displayed]

FINDINGS: The cardio pericardial silhouette is enlarged. There is pulmonary
vascular congestion with interstitial pulmonary edema. Chronic
atelectasis or linear scar in the left mid lung is similar to prior.
The visualized bony structures of the thorax are intact. Insert wire
IMPRESSION: Cardiomegaly with vascular congestion and probable interstitial
edema.

## 2019-05-02 NOTE — Death Summary Note (Signed)
  DEATH SUMMARY   Patient Details  Name: [name] MRN: [id number] DOB: 1960-11-29  Admission/Discharge Information   Admit Date:  March 21, 2019  Date of Death: Date of Death: 2019-04-02  Time of Death: Time of Death: 55  Length of Stay: 11/03/2022  Referring Physician: Patient, No Pcp Per   Reason(s) for Hospitalization  Mechanical fall, hypoxemia  Diagnoses  Preliminary cause of death:   Hypoxic encephalopathy  Secondary Diagnoses (including complications and co-morbidities):  Principal Problem:   Anoxic encephalopathy ([place]) Active Problems:   Essential hypertension   Chronic paranoid schizophrenia ([place])   Cocaine abuse with cocaine-induced mood disorder ([place])   Homelessness   Tobacco use   HLD (hyperlipidemia)   Diabetes mellitus without complication ([place])   Acute on chronic respiratory failure with hypoxia ([place])   Acute on chronic combined systolic (congestive) and diastolic (congestive) heart failure ([facility])   Acute pulmonary edema ([facility])   Shock ([place])   Cardiac arrest ([place])   Seizures ([place])   Brief Hospital Course (including significant findings, care, treatment, and services provided and events leading to death)  BRYNDAN BILYK is a 58 y.o. year old male with history of hypertension, chronic combined systolic and diastolic CHF, schizophrenia, polysubstance abuse including tobacco and cocaine, obstructive sleep apnea, hepatitis C, diabetes.  He was homeless.  He was admitted on 8/28 after a mechanical fall.  He was found to be hypoxemic in the ED.  He was admitted for further care.  He was treated for acute on chronic CHF exacerbation in the setting of severe hypertension and cocaine use.  His antihypertensive medications were restarted and he was diuresed.  On 9/1 he unfortunately sustained an asystolic and hospital arrest that was felt to be due to acute pulmonary edema, associated respiratory failure.  He underwent CPR for 14 minutes, was intubated and ventilated.   Therapeutic hypothermia was initiated and completed on 9/2.  An EEG done 9/2 showed evidence for seizure activity.  An MRI brain done on 9/4 was consistent with severe diffuse cerebral anoxic injury.  His neurologic prognosis was unfortunately deemed to be very poor due to anoxic injury.  Discussions were undertaken with the patient's family were undertaken and decision made to transition to comfort care. He was extubated and expired on April 02, 2019.    Rose Fillers Erielle Gawronski 04/10/2019, 4:22 PM

## 2019-05-09 IMAGING — DX PORTABLE CHEST - 1 VIEW
1 series · 1 of 1 positions shown · non-contrast
Comparison: One-view chest x-ray 10/17/2018

CLINICAL DATA: Shortness of breath.

EXAM:
PORTABLE CHEST 1 VIEW

[chest ap]
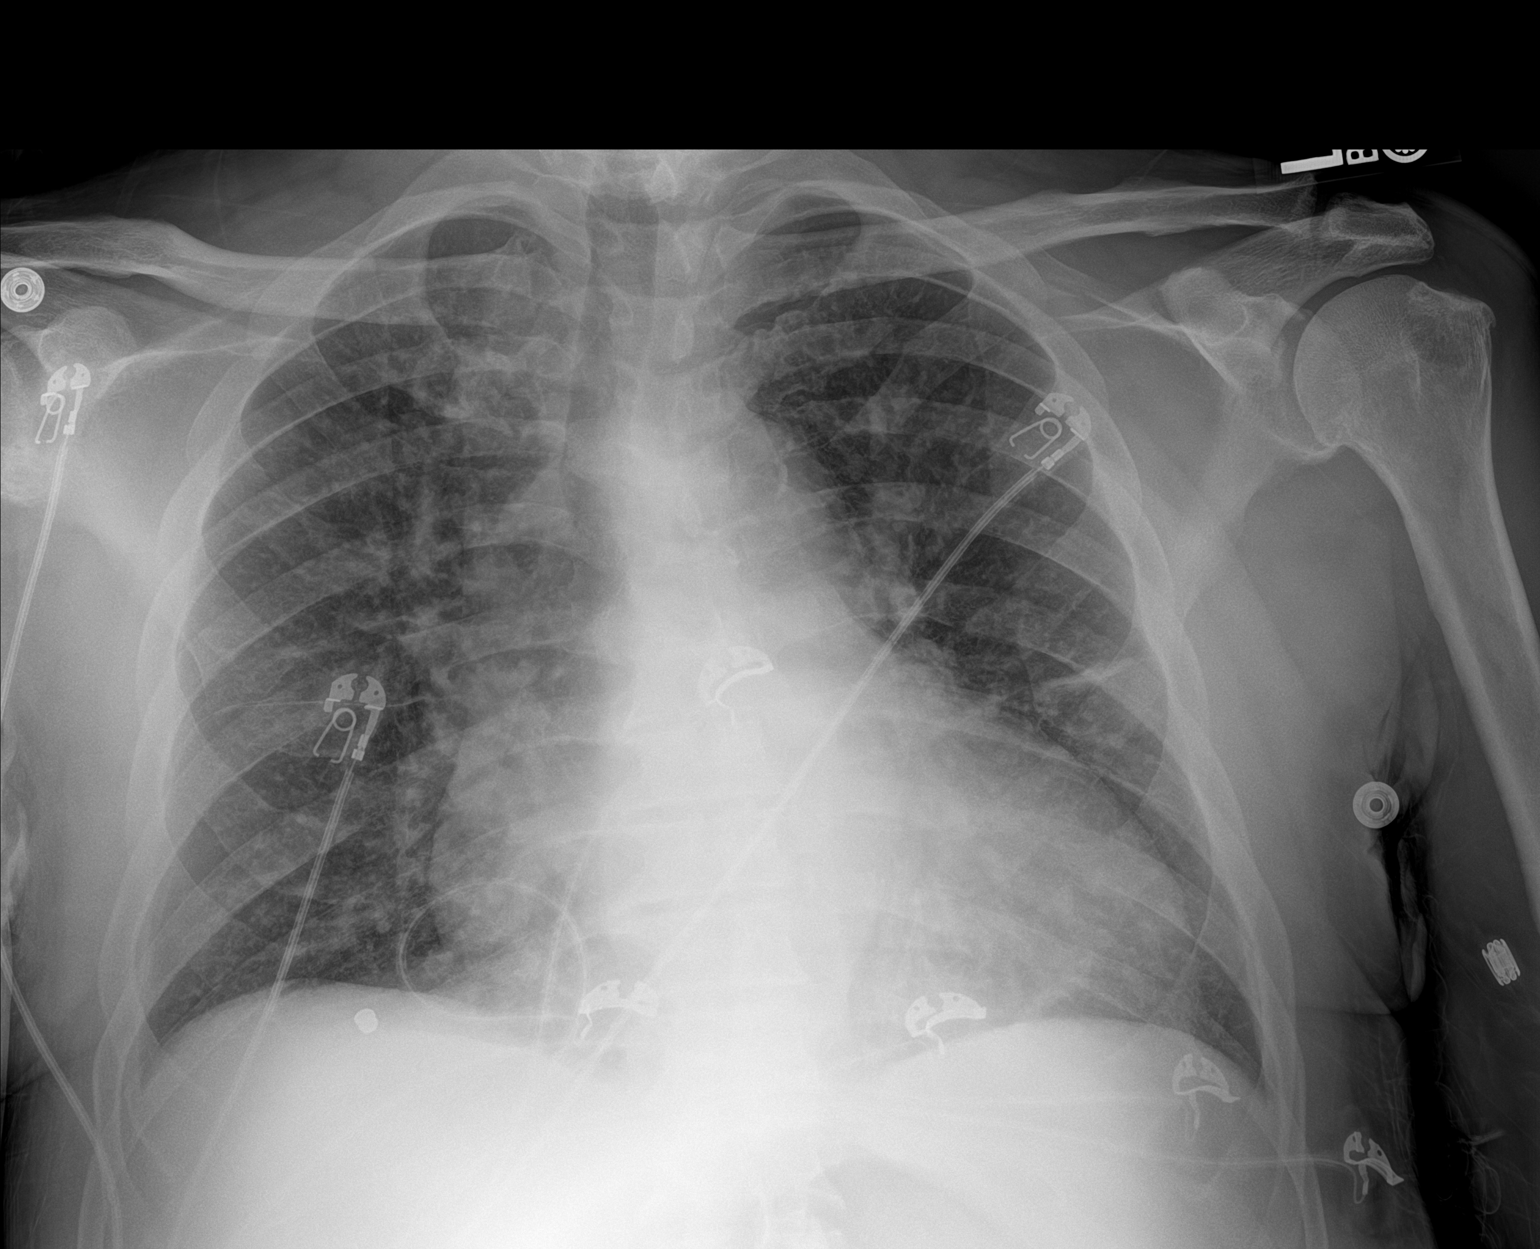

[1 of 1 positions shown; findings below may reference images not displayed]

FINDINGS: Heart is enlarged. A mild diffuse interstitial pattern is present.
Linear atelectasis in the left midlung is stable. No significant
airspace consolidation is present. There are no significant
effusions or pneumothorax.
IMPRESSION: 1. Cardiomegaly with mild interstitial edema, suggesting congestive
heart failure.
2. Stable linear atelectasis in the left midlung.

## 2019-05-18 IMAGING — DX PORTABLE CHEST - 1 VIEW
1 series · 1 of 1 positions shown · non-contrast
Comparison: 10/26/2018

CLINICAL DATA: Bilateral leg swelling, shortness of breath

EXAM:
PORTABLE CHEST 1 VIEW

[chest]
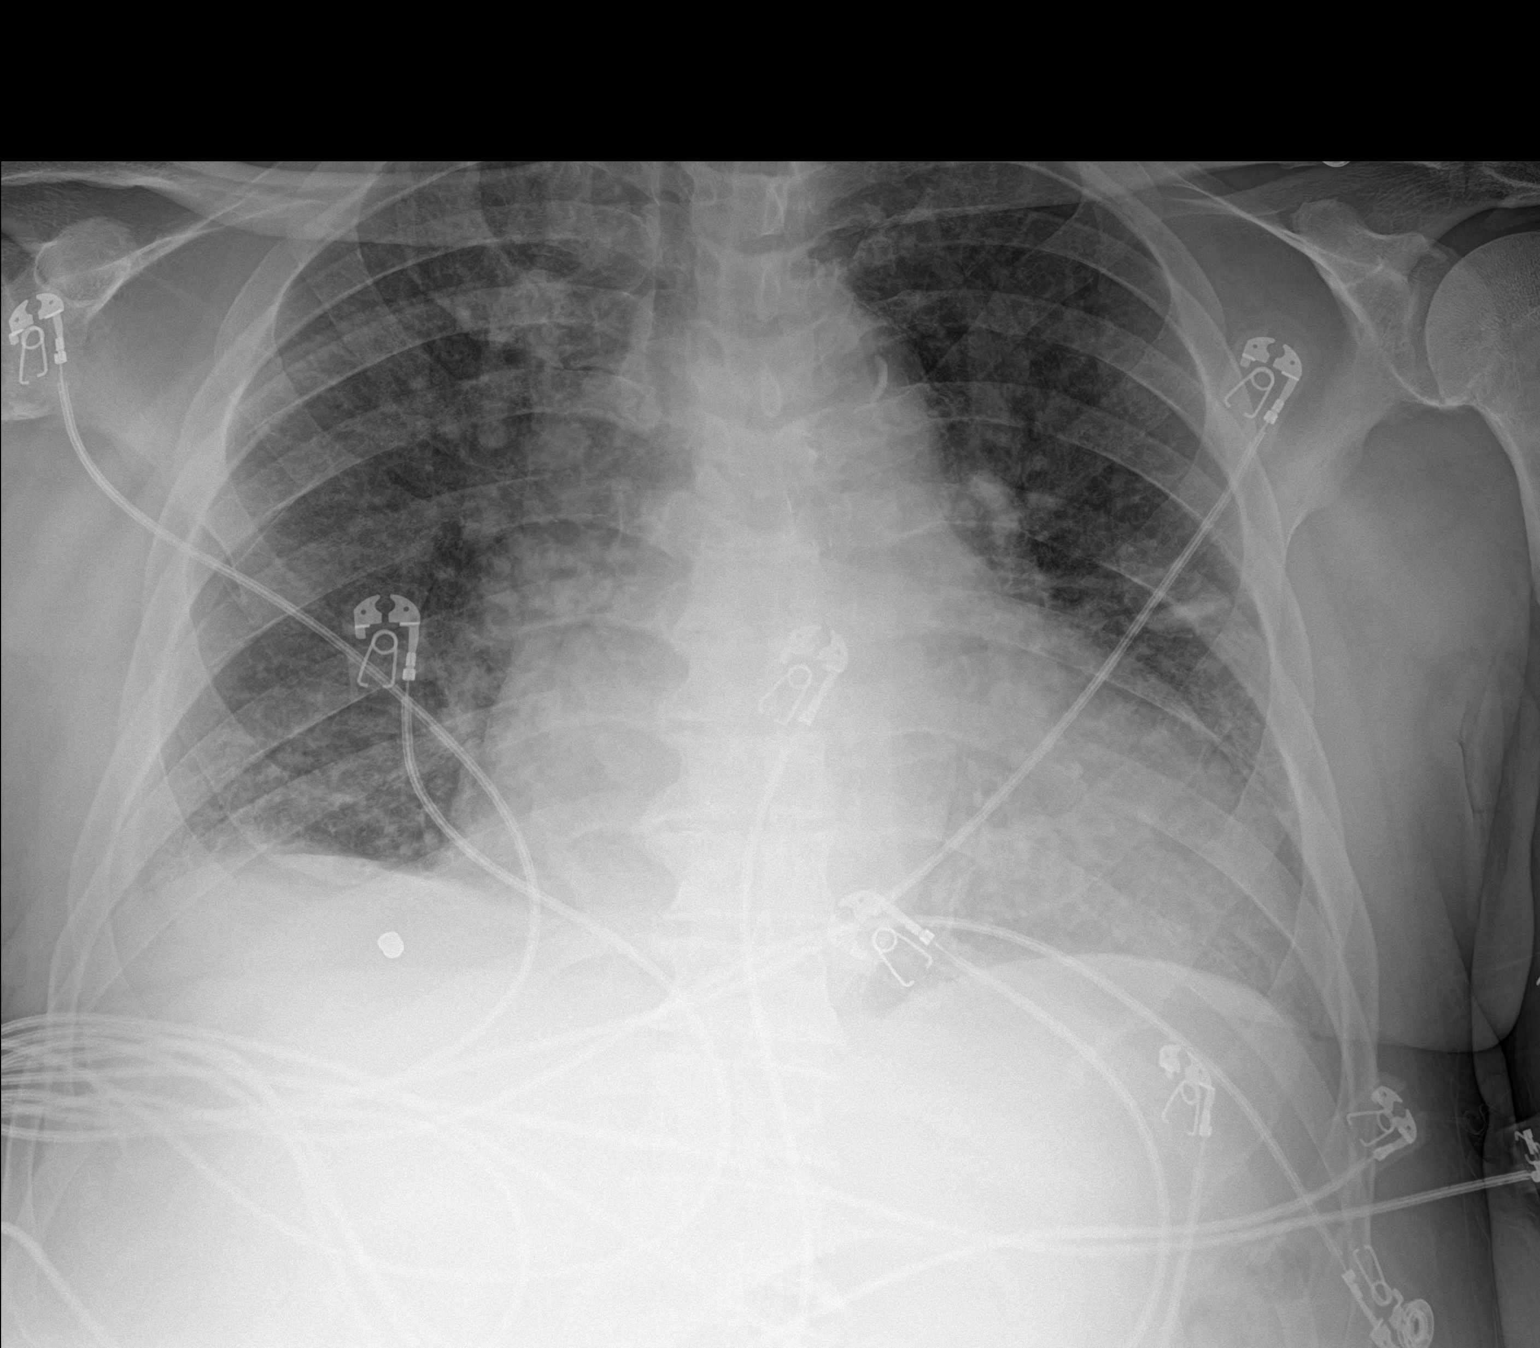

[1 of 1 positions shown; findings below may reference images not displayed]

FINDINGS: Cardiomegaly with vascular congestion. Interstitial prominence
likely reflects early interstitial edema. Right base atelectasis. No
significant effusions or acute bony abnormality.
IMPRESSION: Cardiomegaly with vascular congestion and probable early
interstitial edema.

## 2019-05-19 IMAGING — DX CHEST - 2 VIEW
2 series · 2 of 2 positions shown · non-contrast
Comparison: 11/04/2018, 10/26/2018, 10/17/2018

CLINICAL DATA: Shortness of breath

EXAM:
CHEST - 2 VIEW

[chest pa]
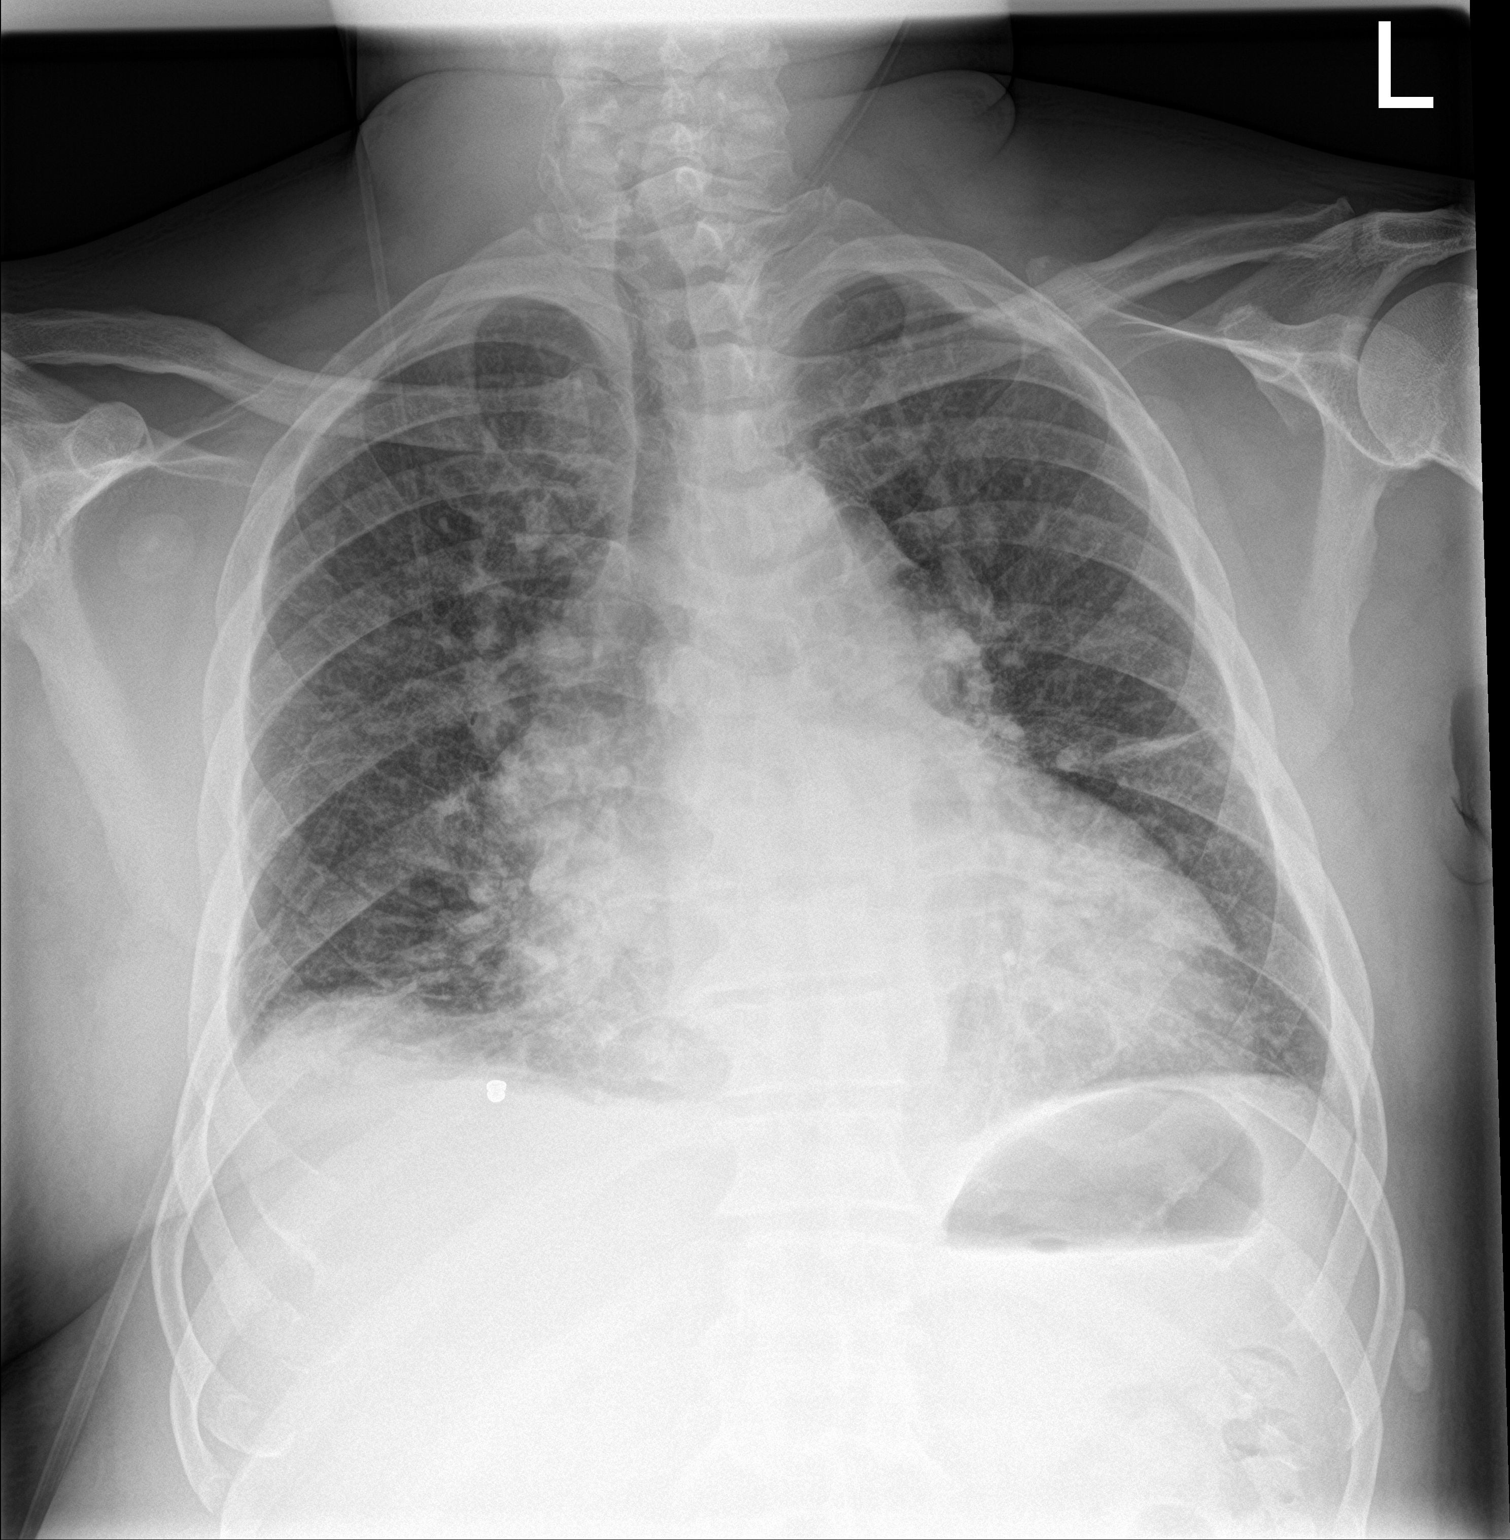

[chest lat]
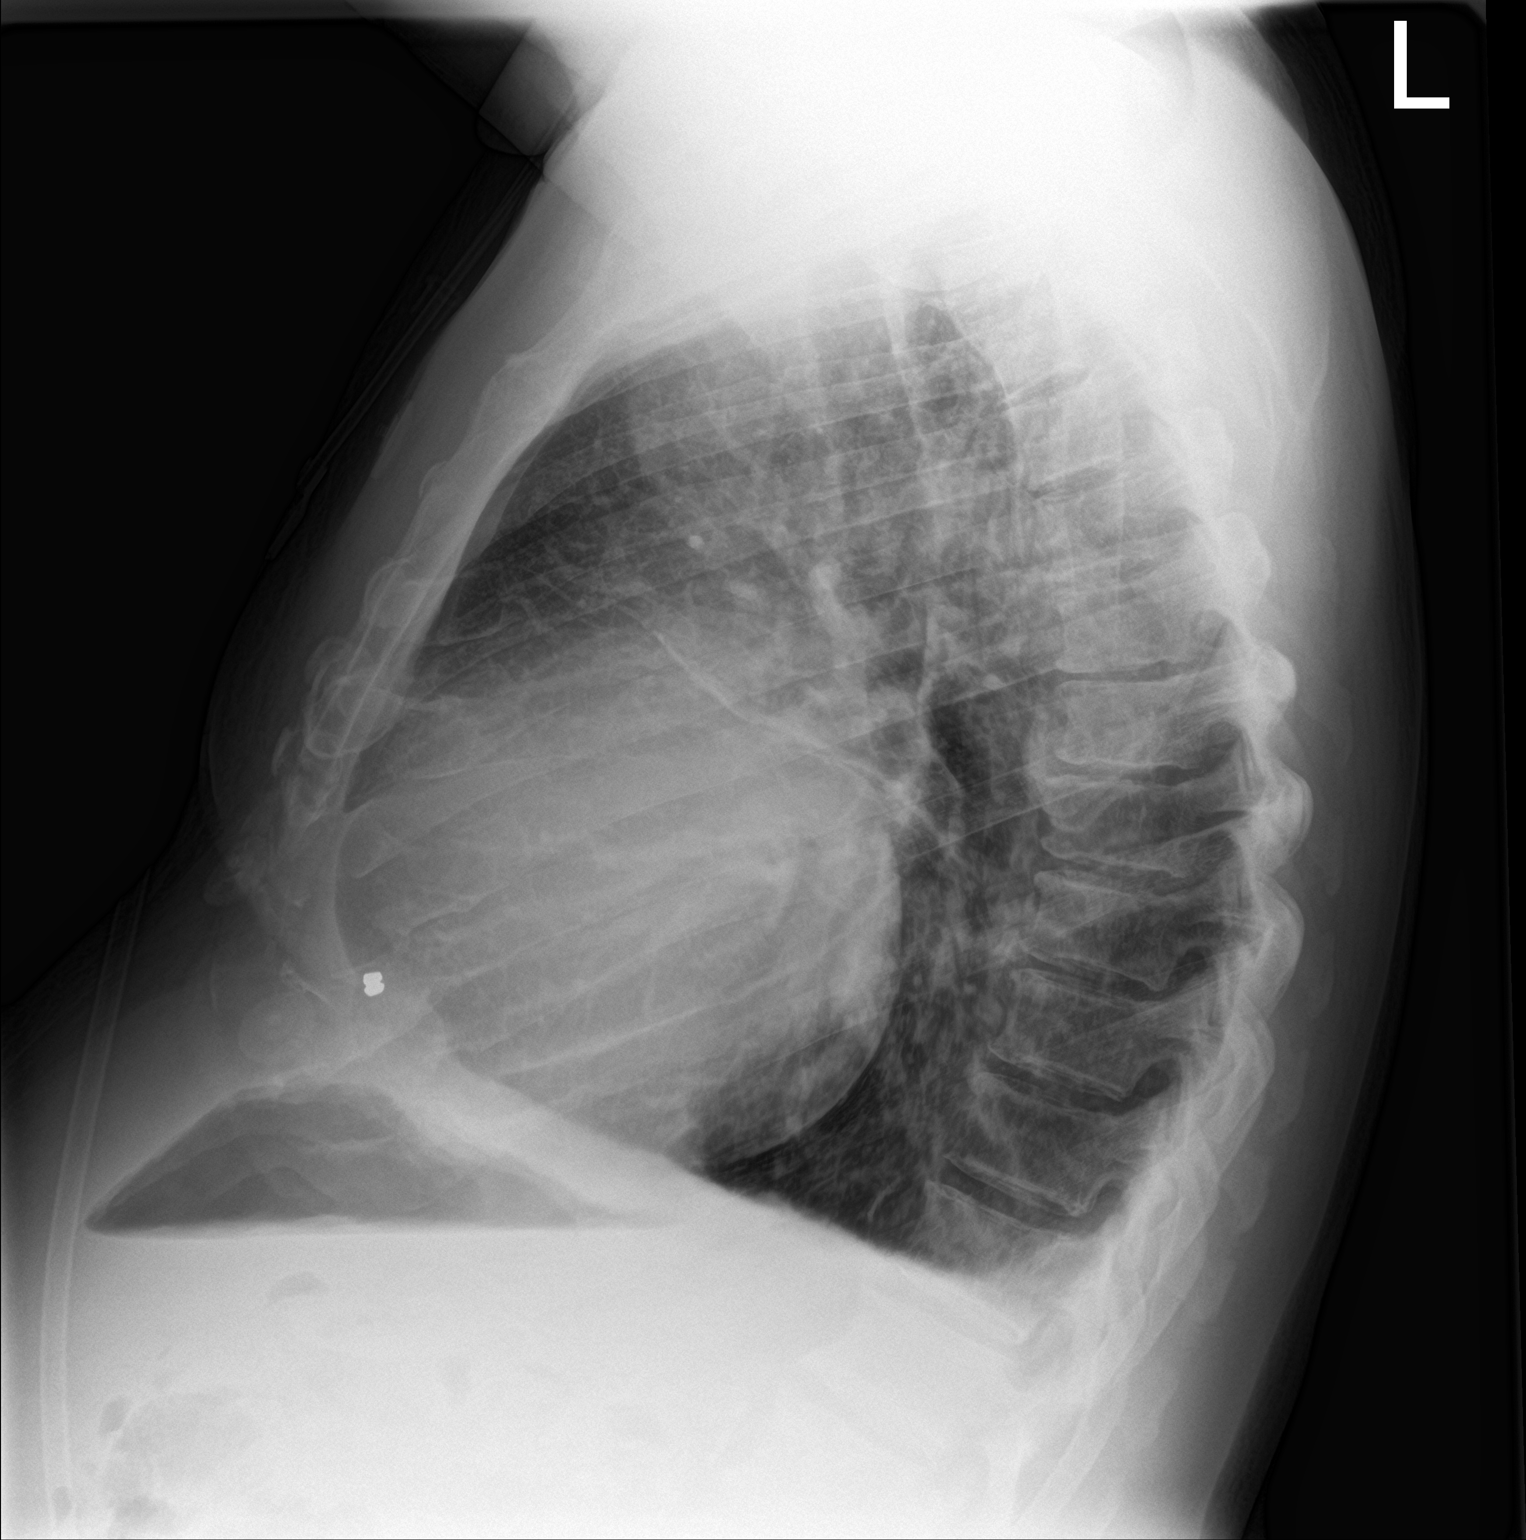

[2 of 2 positions shown; findings below may reference images not displayed]

FINDINGS: Cardiomegaly with vascular congestion and mild diffuse interstitial
process, likely edema. Small bilateral pleural effusions. No
pneumothorax. Linear atelectasis or scar in the left mid lung.
IMPRESSION: Cardiomegaly with vascular congestion, mild interstitial edema and
small bilateral pleural effusions.

## 2019-05-20 IMAGING — DX PORTABLE CHEST - 1 VIEW
1 series · 1 of 1 positions shown · non-contrast
Comparison: 11/05/2018

CLINICAL DATA: Shortness of breath, cough

EXAM:
PORTABLE CHEST 1 VIEW

[chest ap]
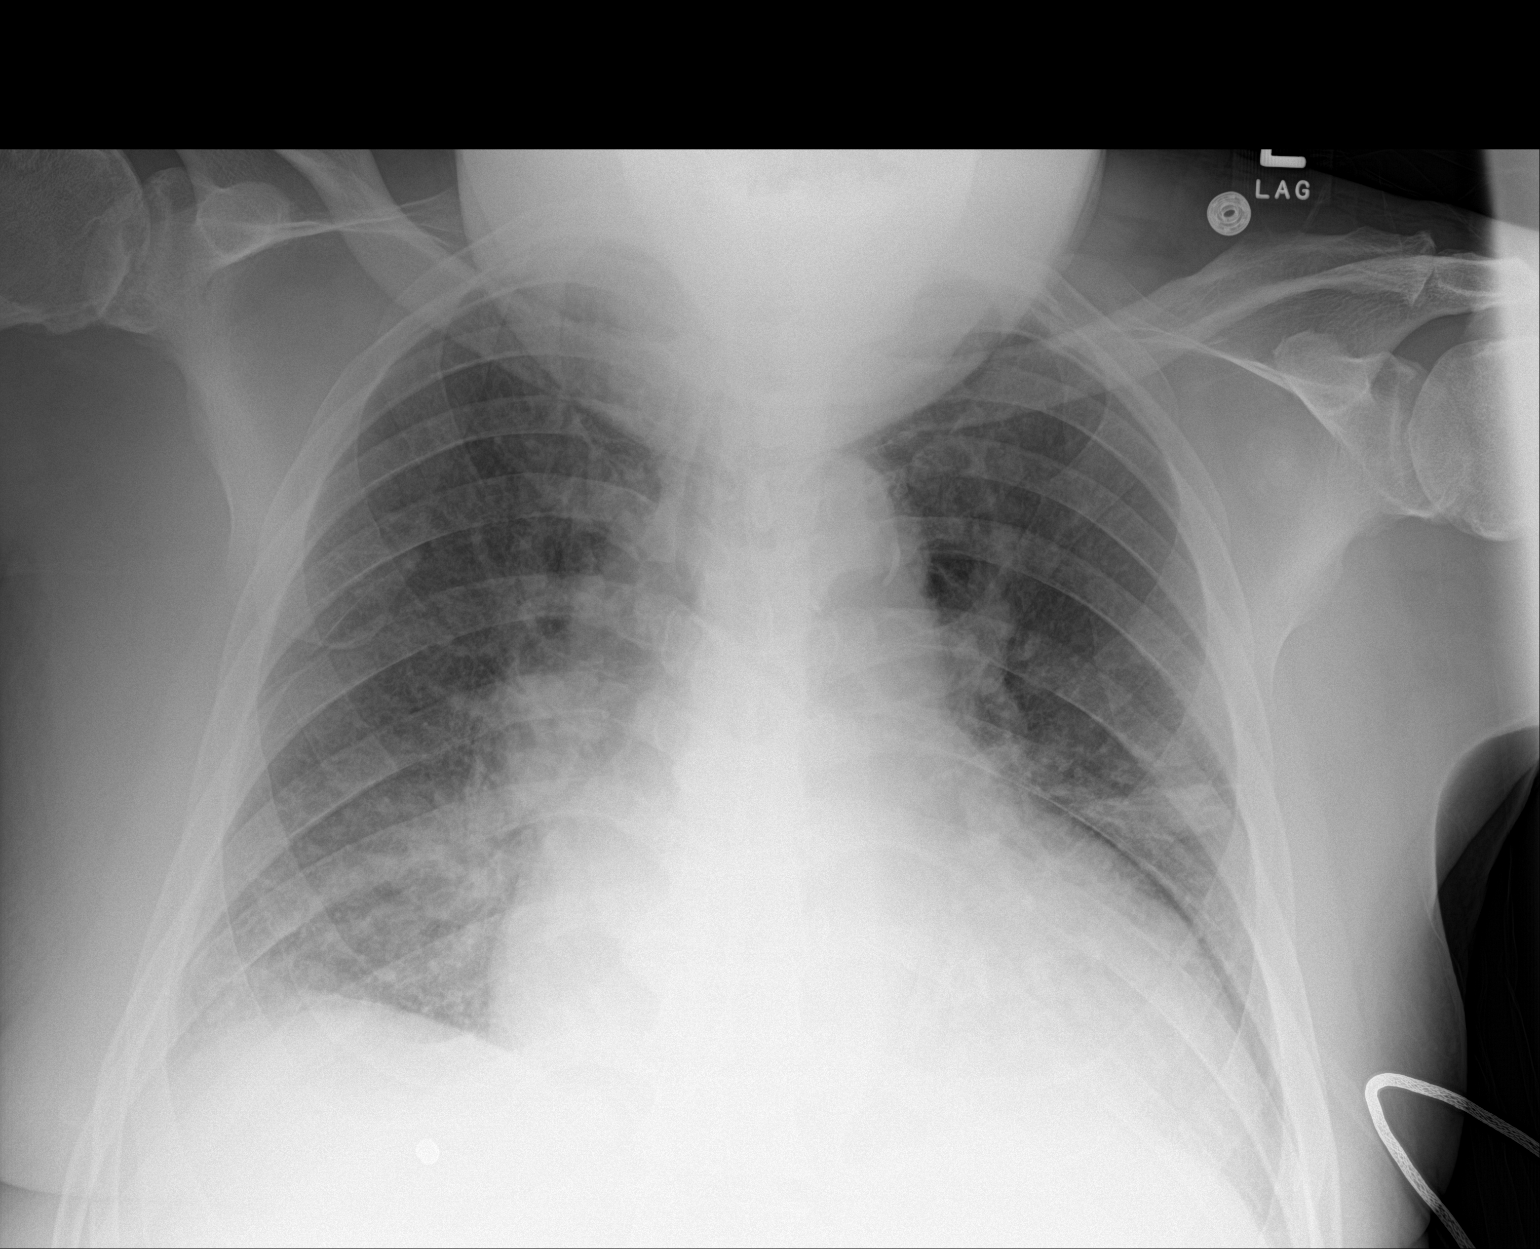

[1 of 1 positions shown; findings below may reference images not displayed]

FINDINGS: Cardiomegaly. Bilateral perihilar and lower lobe opacities, with
interstitial prominence. Findings likely reflect edema/CHF. No
visible significant effusions or acute bony abnormality.
IMPRESSION: Mild to moderate CHF.

## 2019-05-21 IMAGING — DX PORTABLE CHEST - 1 VIEW
1 series · 1 of 1 positions shown · non-contrast
Comparison: 11/06/2018

CLINICAL DATA: Pt came to the ED with complaints of bilateral legs
swelling, testicle pain and weakness PT HX: current smoker, DM,
HTNleft swelling, shortness of breath; h/o of CHF

EXAM:
PORTABLE CHEST 1 VIEW

[chest ap]
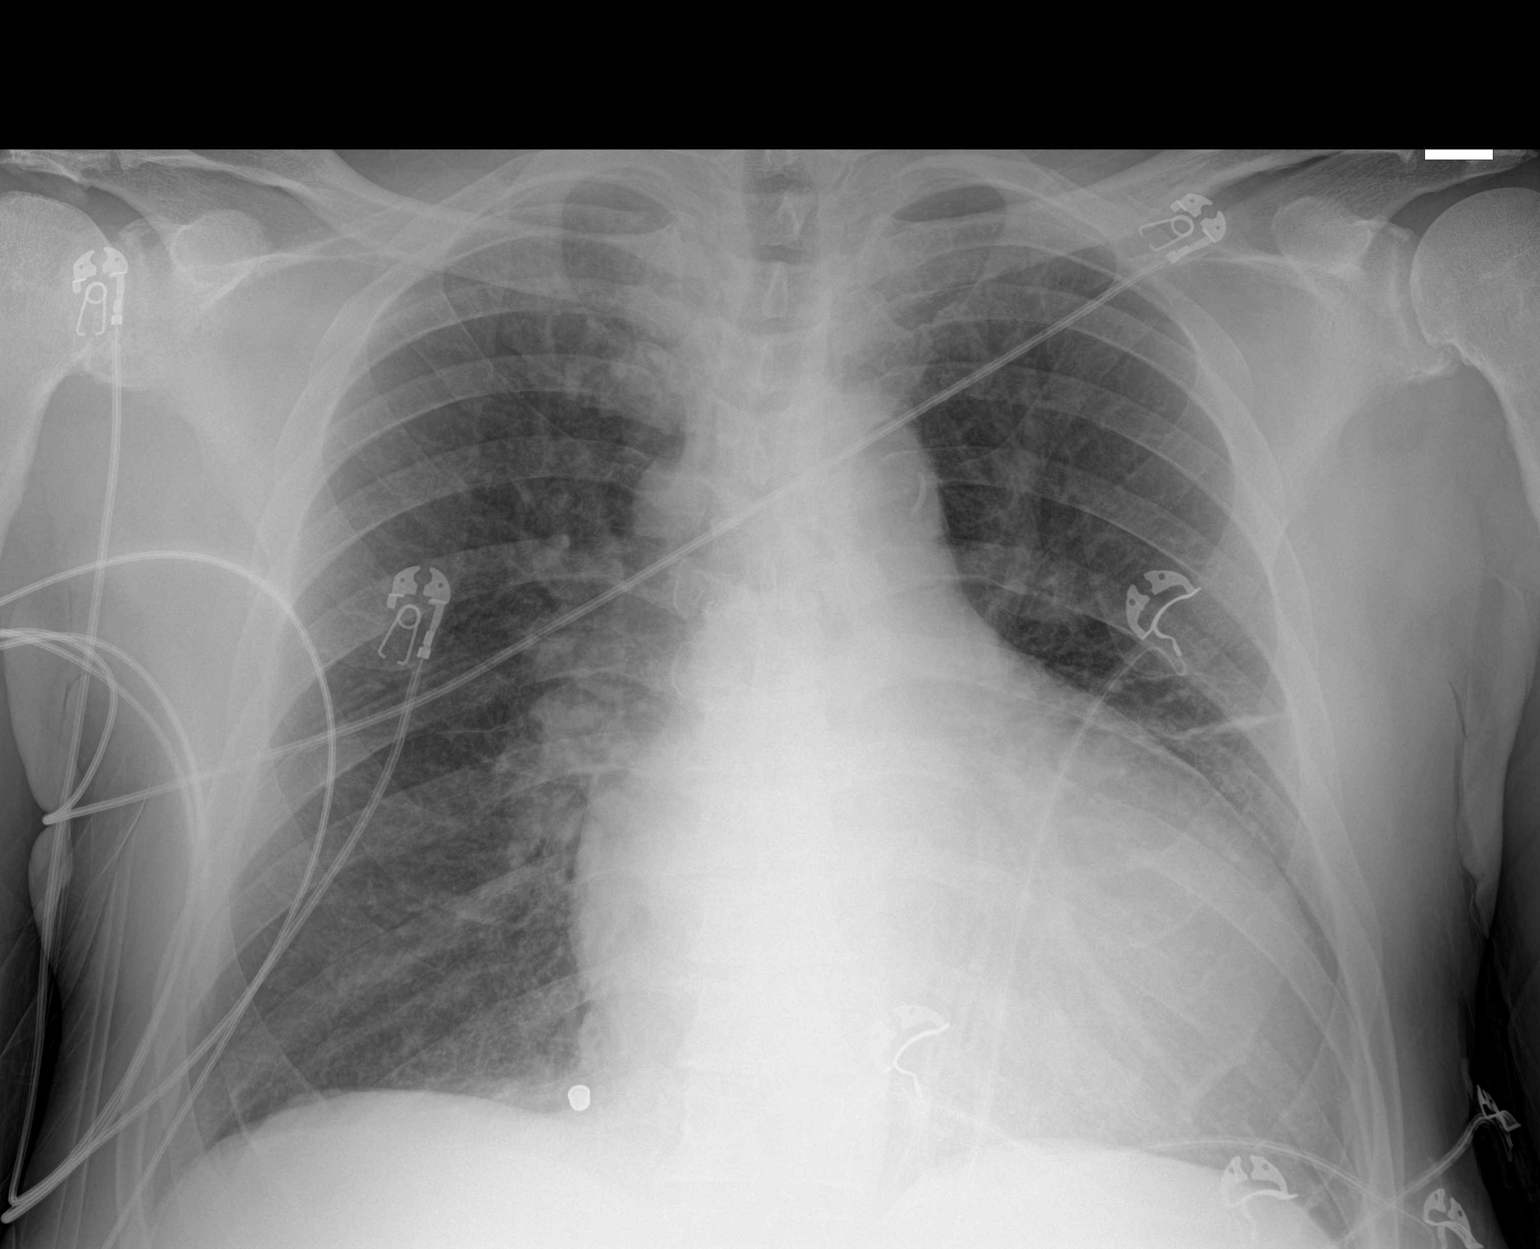

[1 of 1 positions shown; findings below may reference images not displayed]

FINDINGS: Stable enlarged cardiac silhouette. No effusion, infiltrate
pneumothorax. Improvement in central venous congestion pattern seen
on comparison exam.
IMPRESSION: Stable cardiomegaly. Improvement in central venous pulmonary
congestion

## 2019-05-24 IMAGING — CR CHEST - 2 VIEW
2 series · 2 of 2 positions shown · non-contrast
Comparison: Chest x-ray dated 11/07/2018 and 11/05/2018

CLINICAL DATA: Lower extremity swelling.  Recent heart failure.

EXAM:
CHEST - 2 VIEW

[w chest lat]
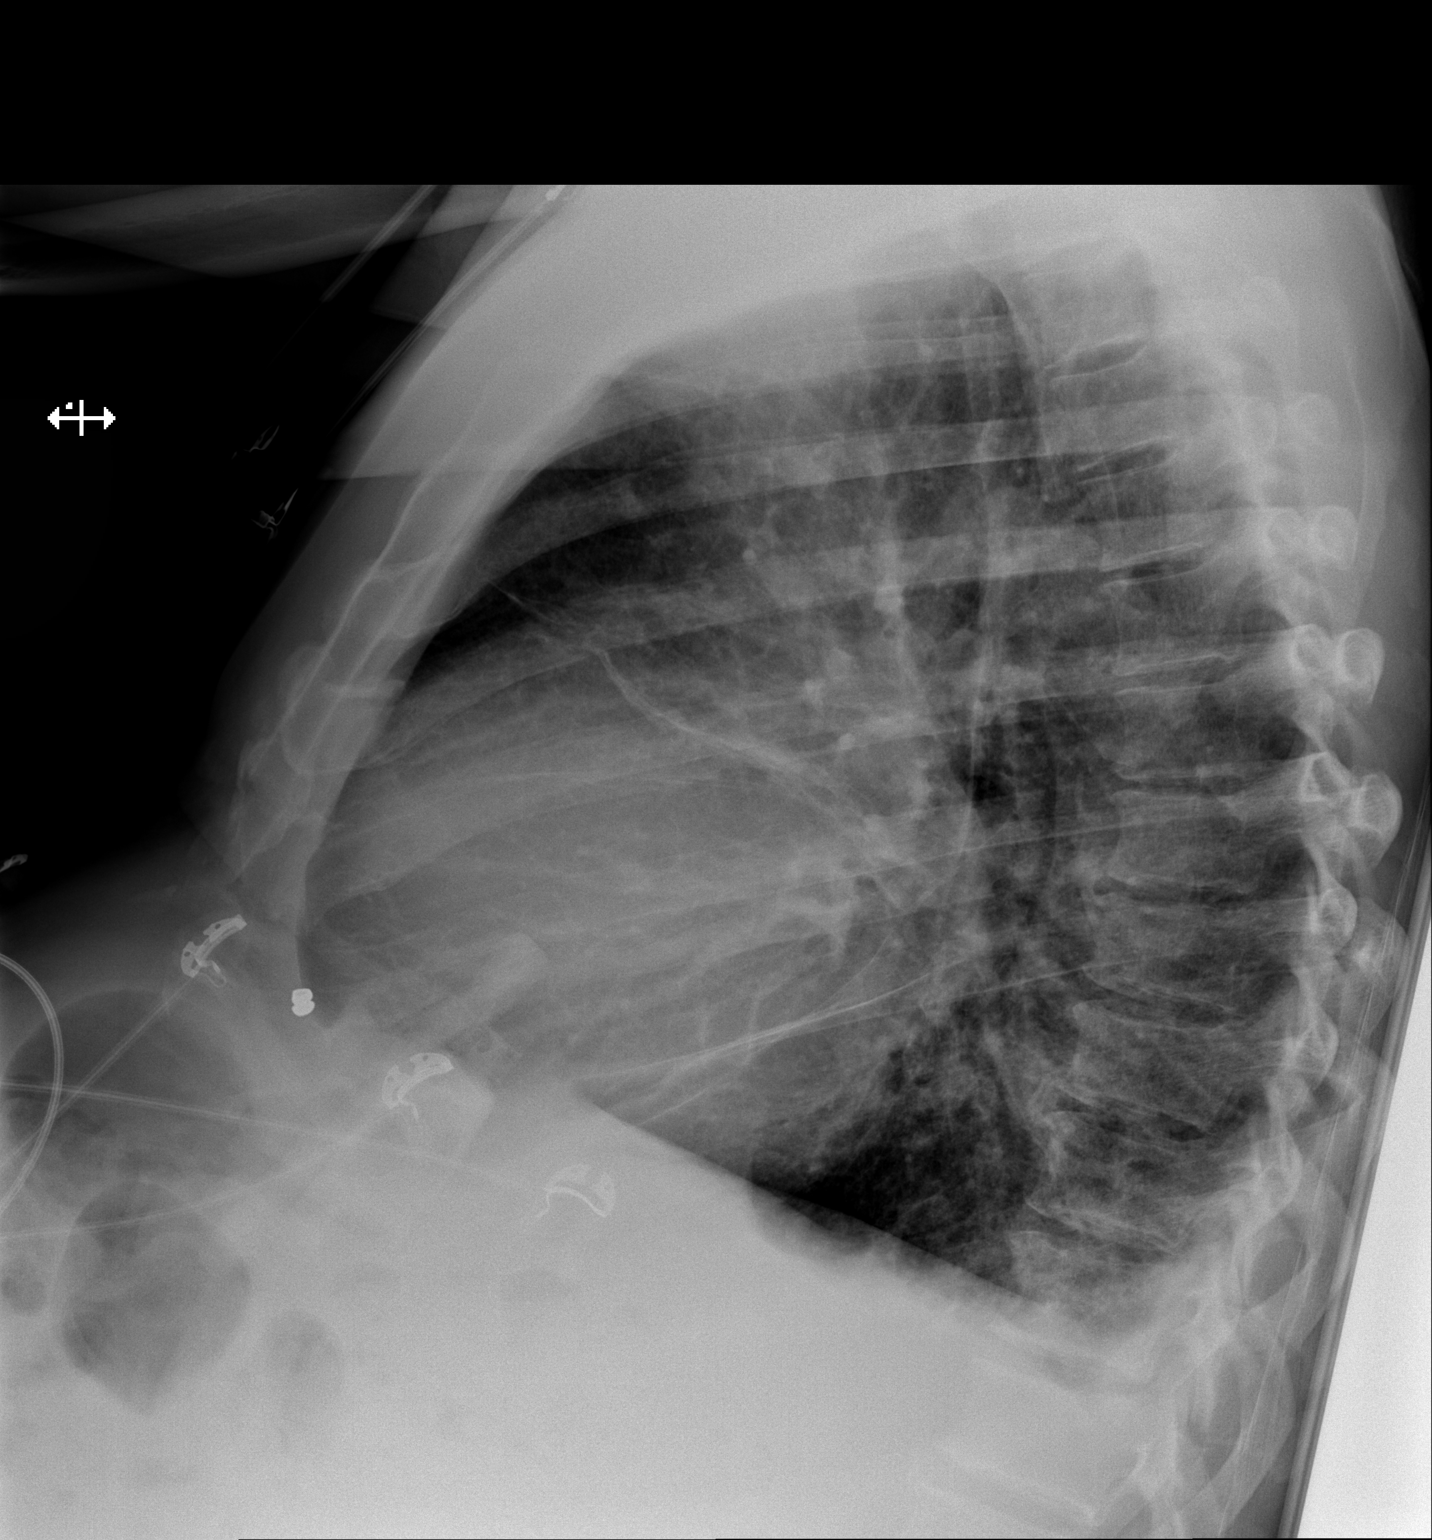

[x chest ap]
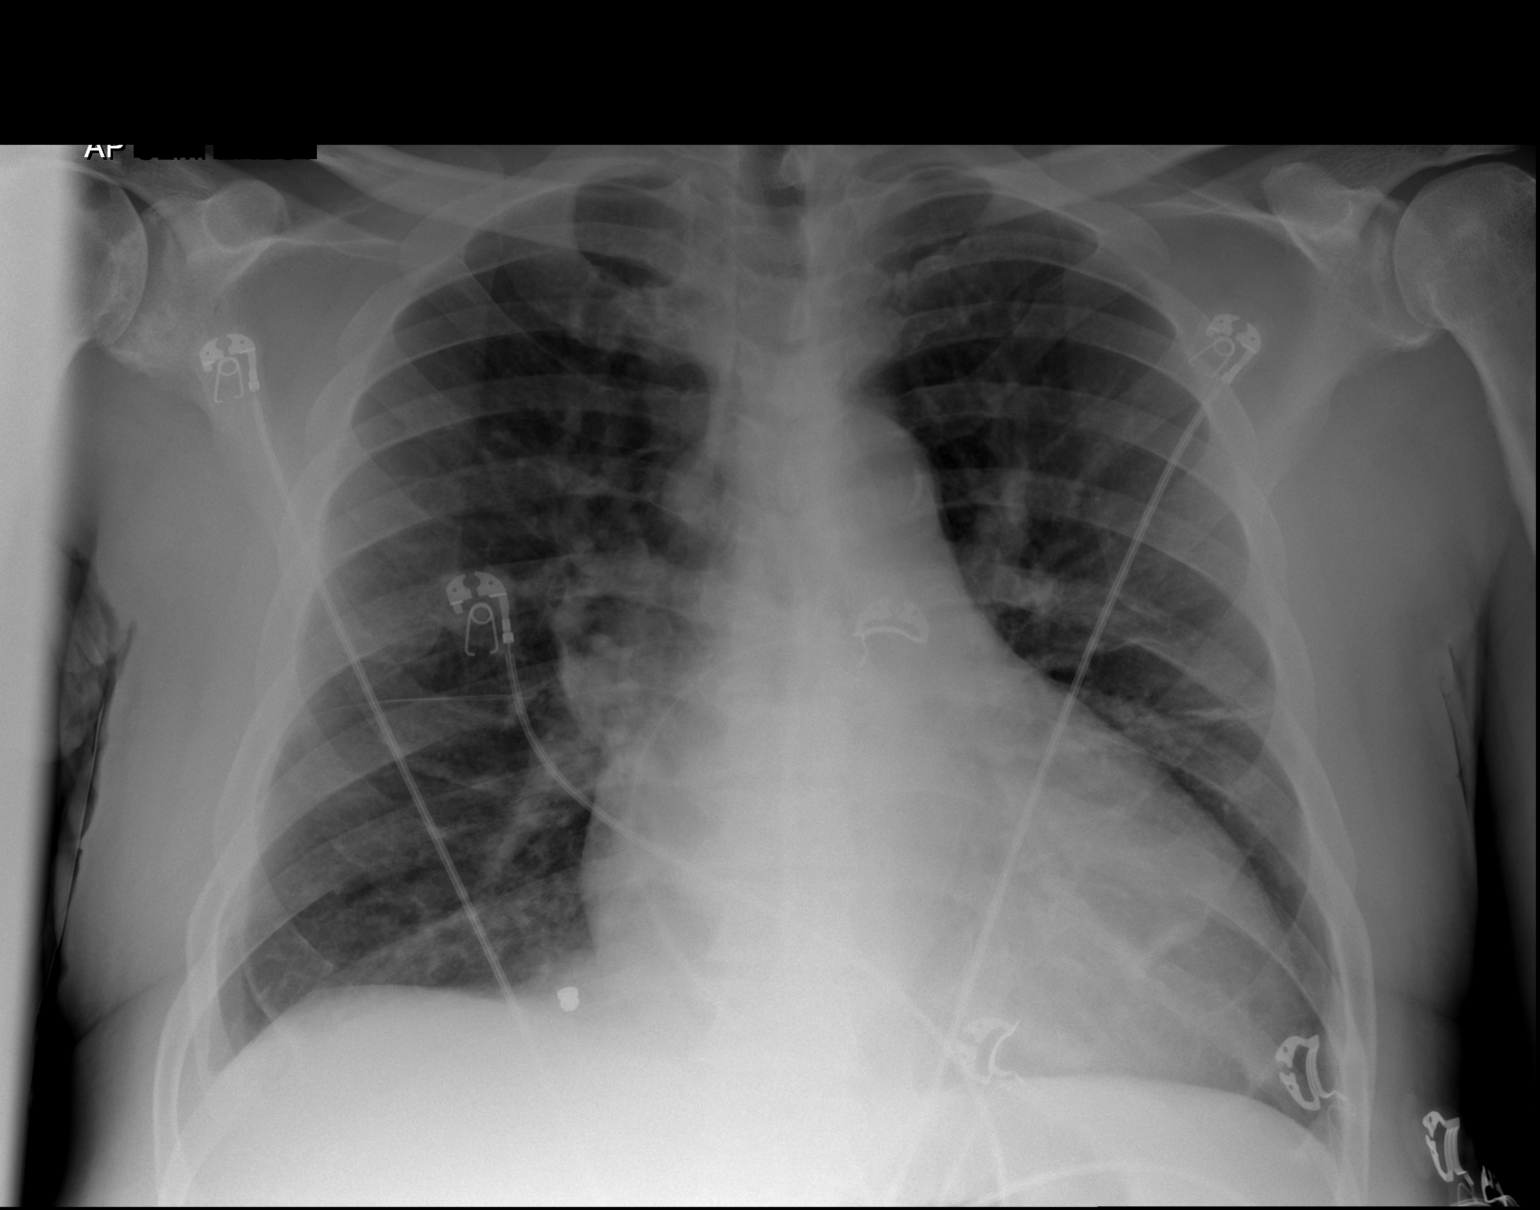

[2 of 2 positions shown; findings below may reference images not displayed]

FINDINGS: Chronic cardiomegaly. Tiny bilateral pleural effusions. Small amount
of fluid along the fissures. No visible pulmonary edema. Slight
pulmonary vascular congestion.

Aortic atherosclerosis.
IMPRESSION: Chronic cardiomegaly. Slight pulmonary vascular congestion, slightly
more prominent than on the prior exam. Minimal bilateral pleural
effusions.

Aortic Atherosclerosis ([id number]-BN1.1).

## 2019-05-25 IMAGING — DX PORTABLE CHEST - 1 VIEW
1 series · 1 of 1 positions shown · non-contrast
Comparison: 11/10/2018 and earlier.

CLINICAL DATA: 57-year-old male with shortness of breath. Abdominal
pain, lower extremity swelling.

EXAM:
PORTABLE CHEST 1 VIEW

[chest ap]
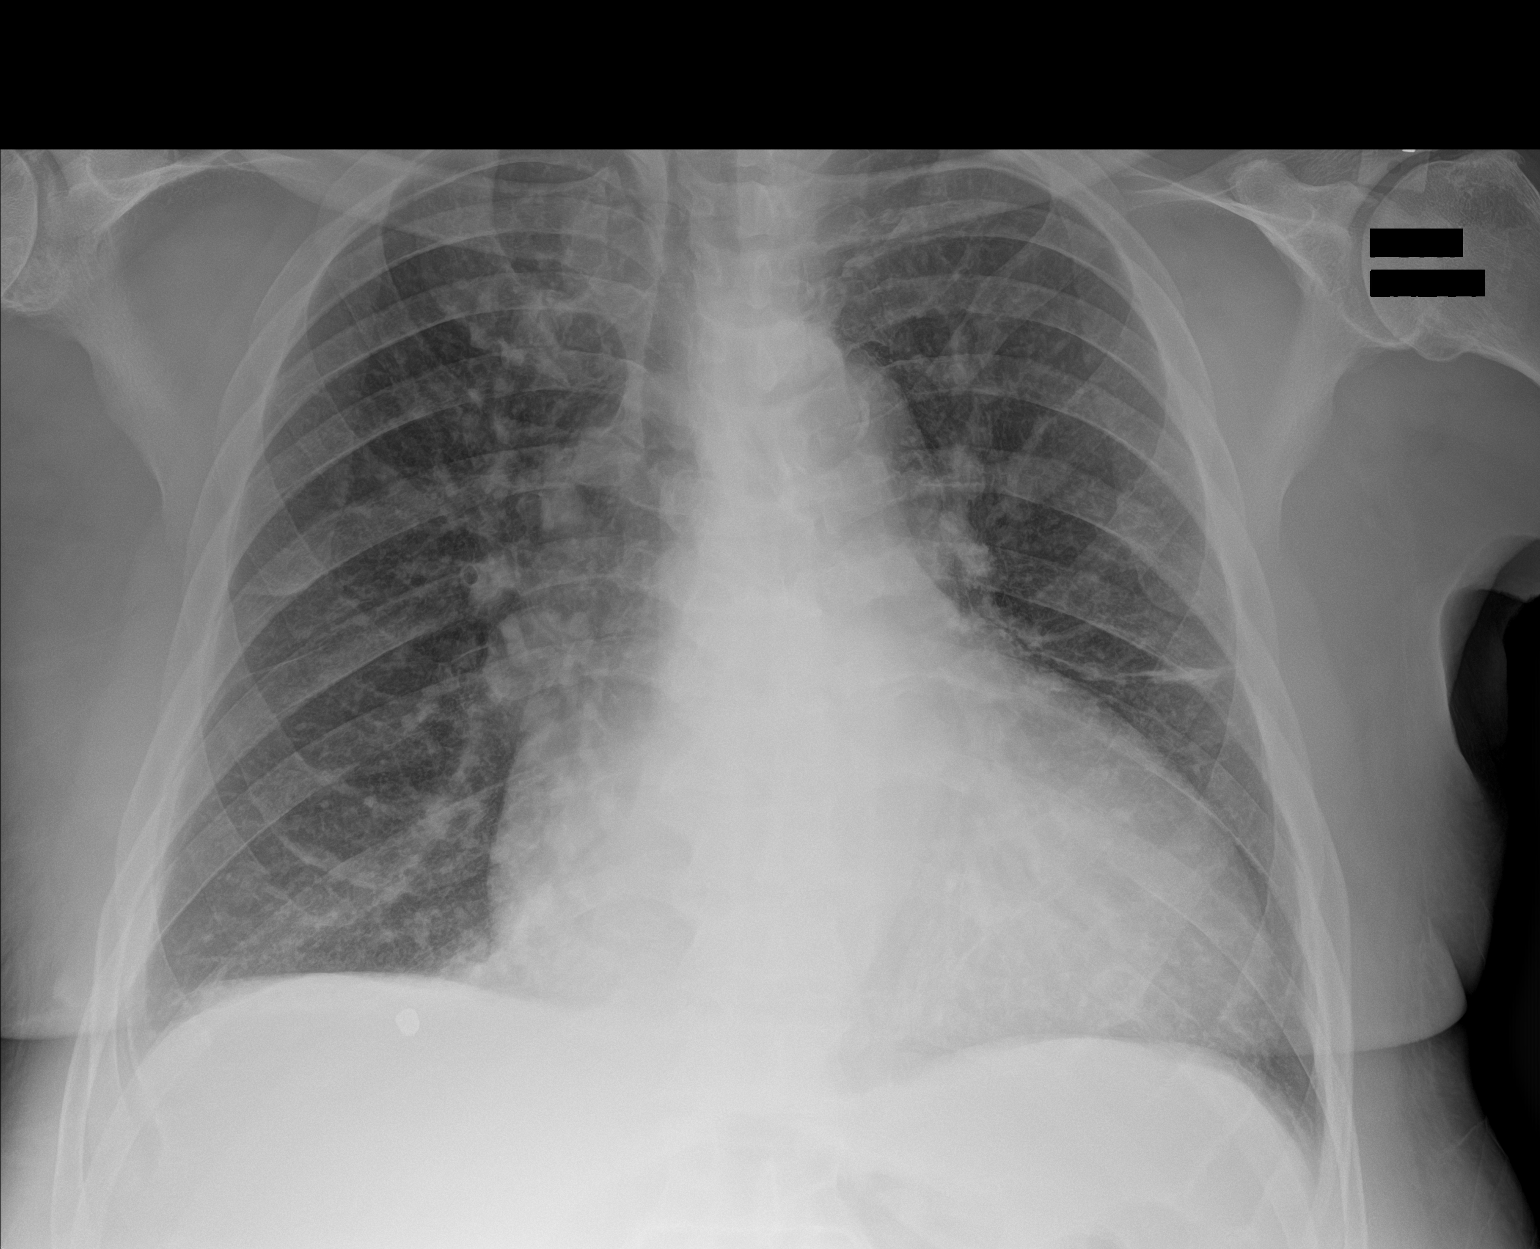

[1 of 1 positions shown; findings below may reference images not displayed]

FINDINGS: Portable AP upright view at 2405 hours. Stable cardiomegaly and
mediastinal contours. Visualized tracheal air column is within
normal limits. Stable lung volumes. Mild linear atelectasis is
stable in the mid left lung. Increased interstitial opacity with
indistinct vasculature. Persistent small right pleural effusion. No
pneumothorax or consolidation. Stable small retained metallic
foreign body projecting at the anterior right hemidiaphragm.
IMPRESSION: Acute pulmonary interstitial edema suspected. Stable cardiomegaly
and small right pleural effusion.

## 2019-05-27 IMAGING — DX PORTABLE CHEST - 1 VIEW
1 series · 1 of 1 positions shown · non-contrast
Comparison: 11/11/2018

CLINICAL DATA: Shortness of breath and edema

EXAM:
PORTABLE CHEST 1 VIEW

[chest ap]
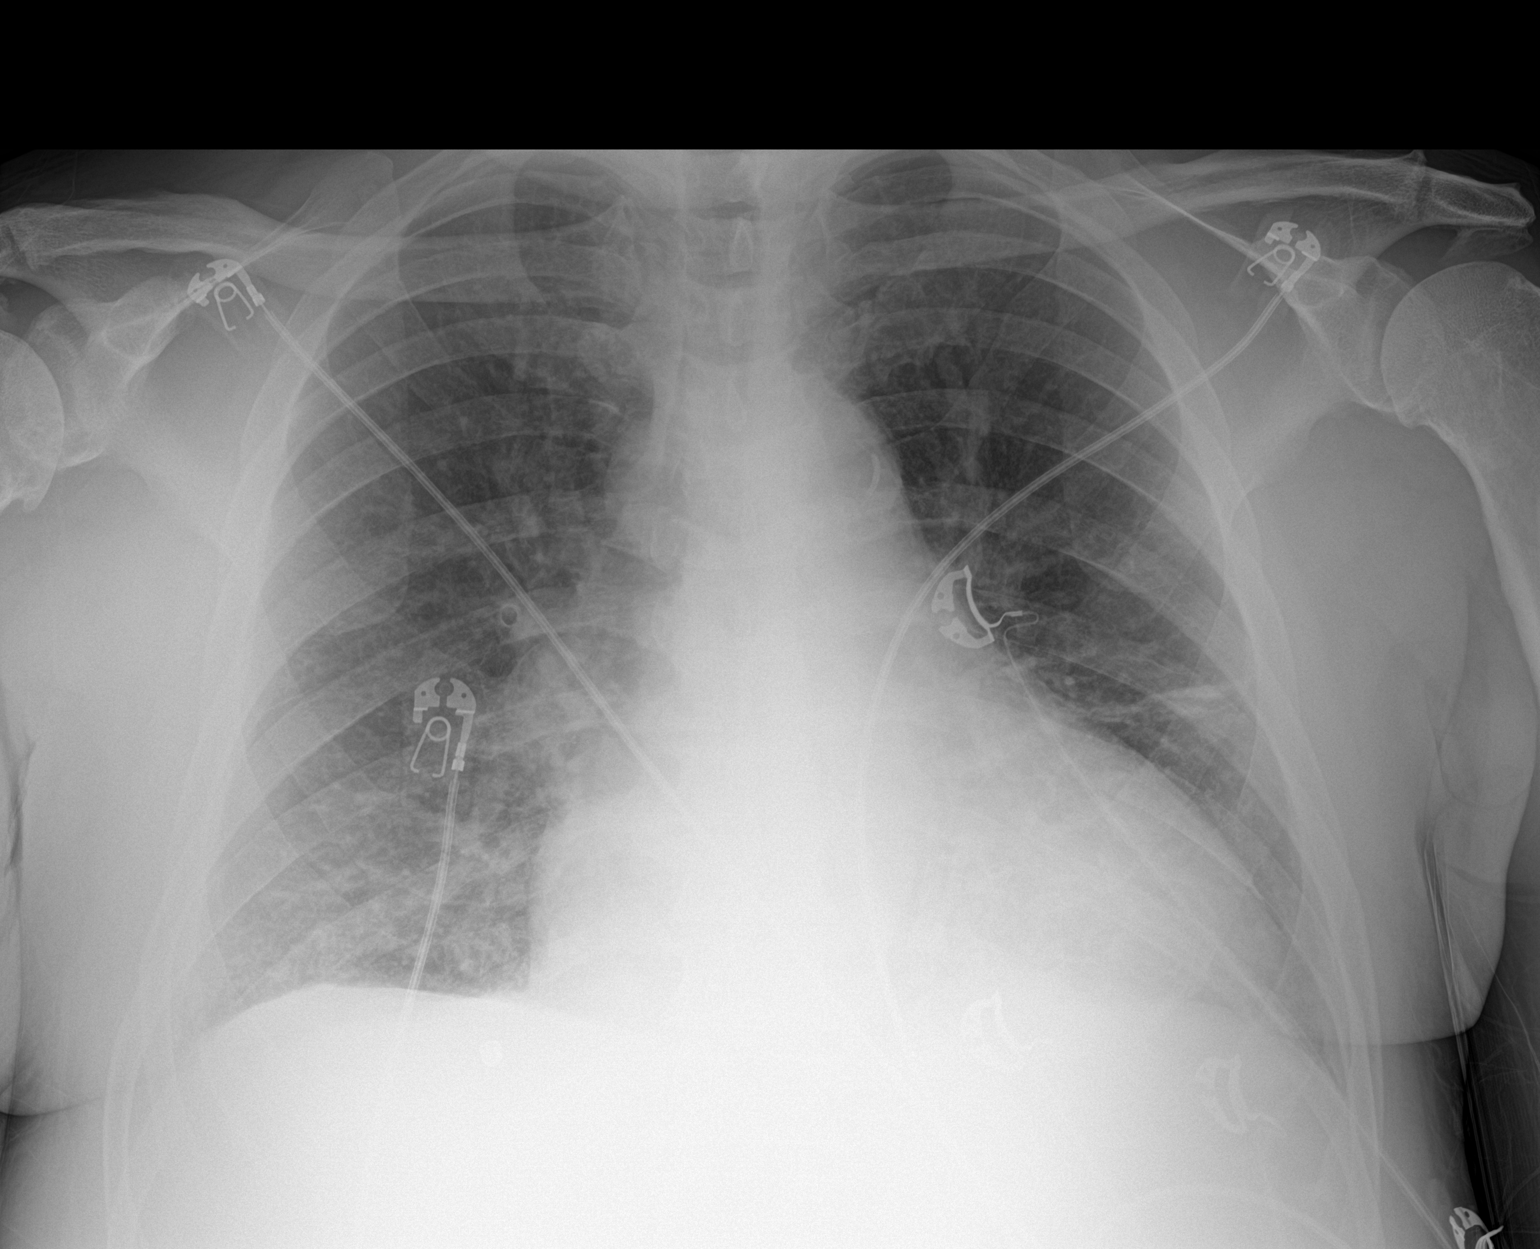

[1 of 1 positions shown; findings below may reference images not displayed]

FINDINGS: Cardiac shadow is enlarged in size but stable. Aortic calcifications
are again seen. Scarring is noted in the left mid lung. The
previously seen vascular congestion is improved when compared with
the prior study. No focal infiltrate is seen. No sizable effusion is
noted.
IMPRESSION: Improvement of vascular congestion when compared with the prior
study.

## 2019-05-29 IMAGING — DX PORTABLE CHEST - 1 VIEW
1 series · 1 of 1 positions shown · non-contrast
Comparison: November 13, 2018

CLINICAL DATA: Fluid in stomach and legs.

EXAM:
PORTABLE CHEST 1 VIEW

[chest]
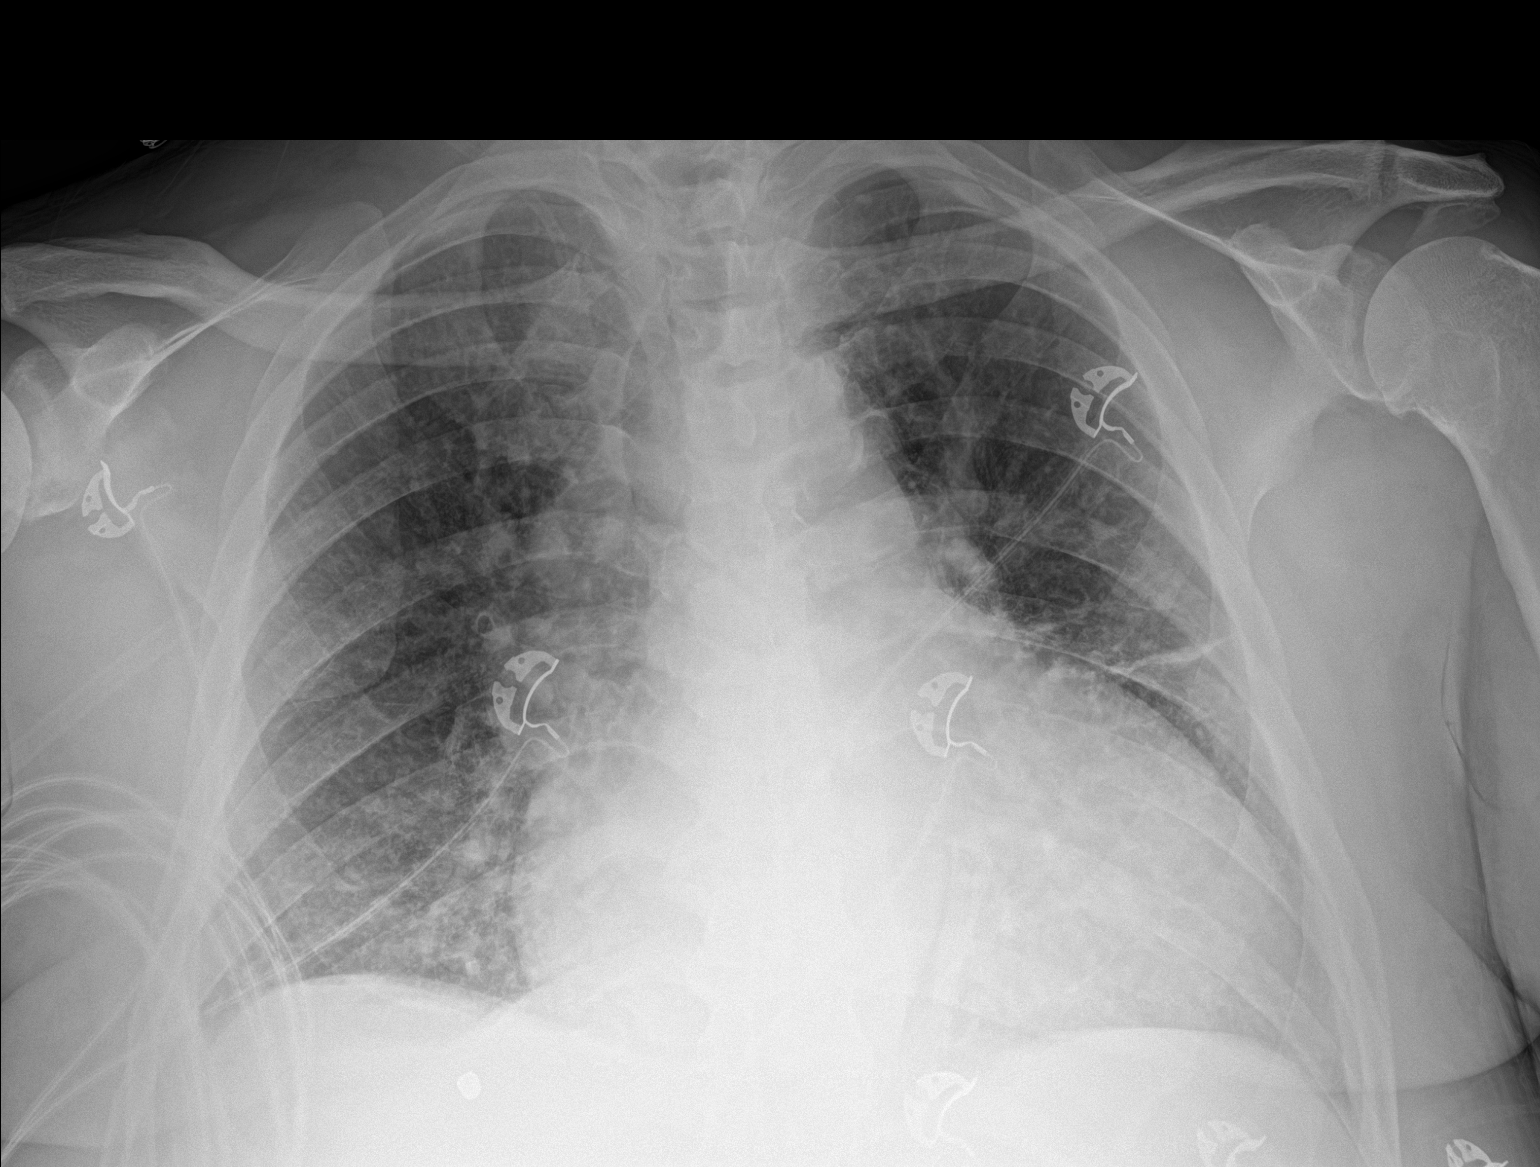

[1 of 1 positions shown; findings below may reference images not displayed]

FINDINGS: Stable cardiomegaly. Increased interstitial markings in the lungs
consistent with pulmonary venous congestion. Atelectasis in the left
mid lung. No other acute abnormalities.
IMPRESSION: Cardiomegaly and pulmonary venous congestion.

## 2019-05-31 IMAGING — DX PORTABLE CHEST - 1 VIEW
1 series · 1 of 1 positions shown · non-contrast
Comparison: Two days ago

CLINICAL DATA: Shortness of breath

EXAM:
PORTABLE CHEST 1 VIEW

[chest]
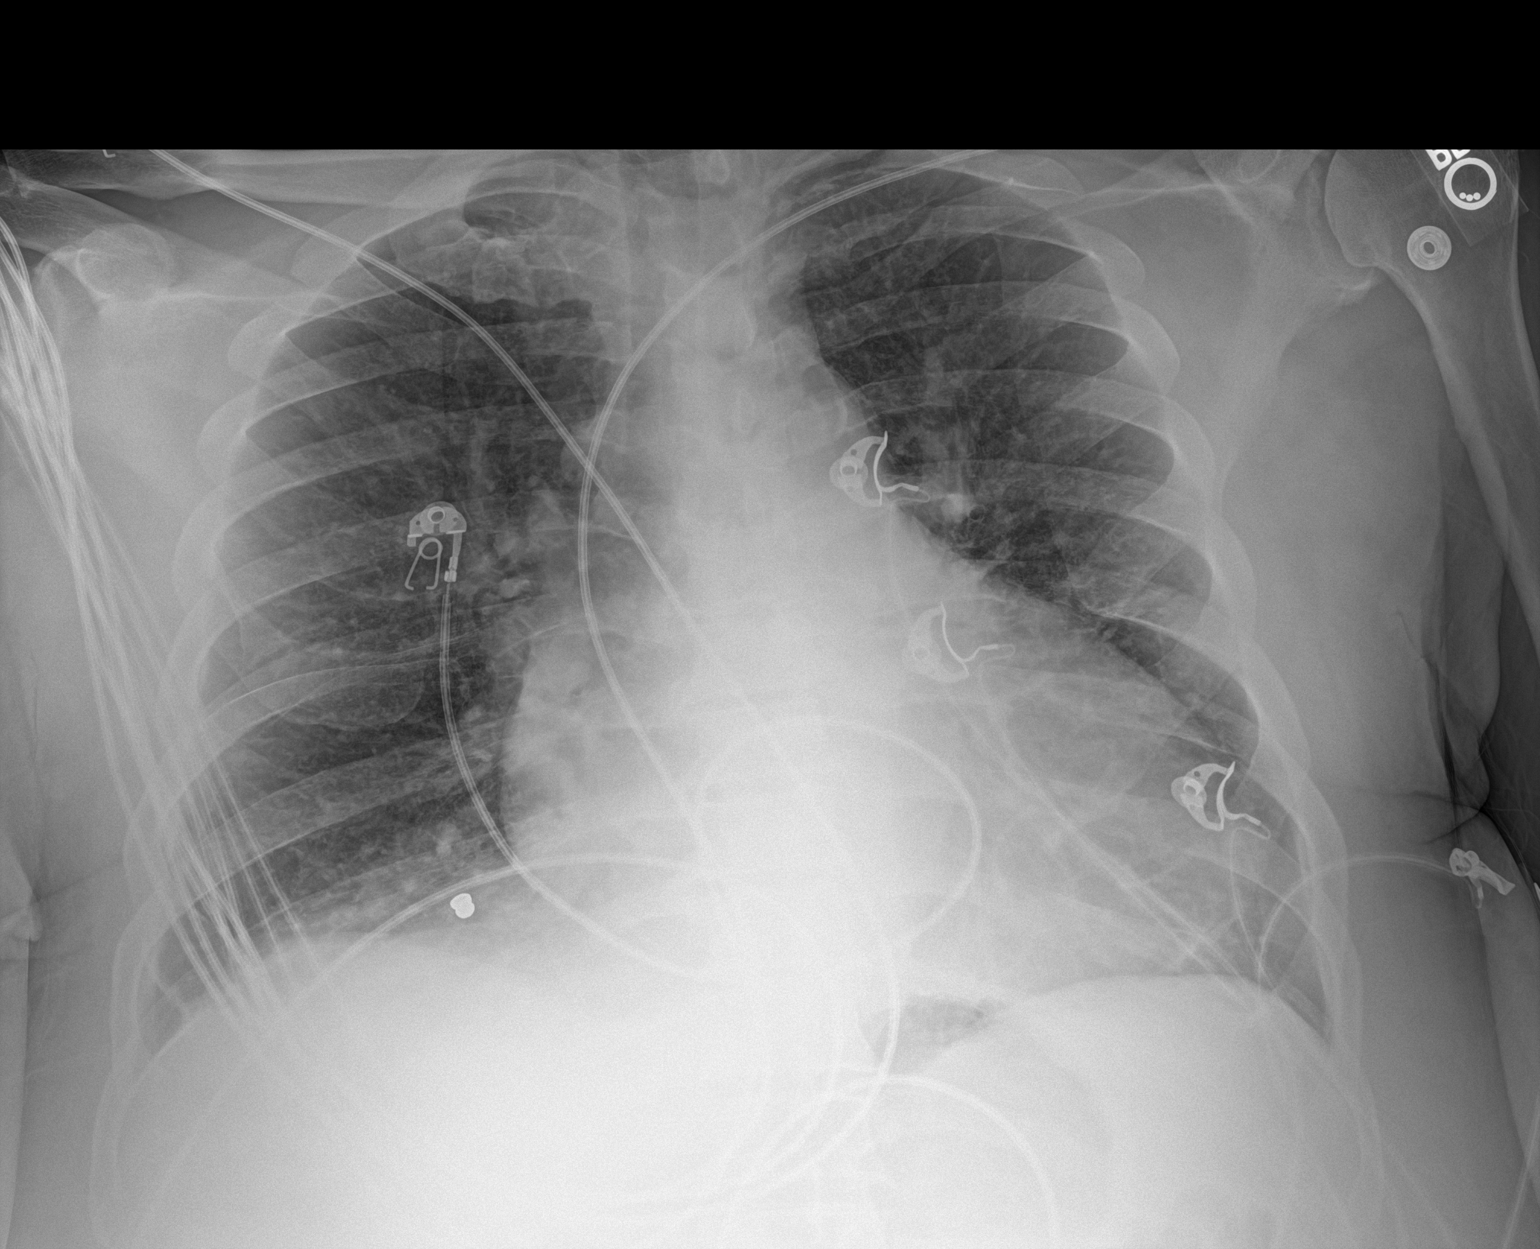

[1 of 1 positions shown; findings below may reference images not displayed]

FINDINGS: Chronic cardiomegaly. Chronic generalized interstitial coarsening.
No Kerley lines, effusion, or pneumothorax. Chronic metallic foreign
body over the right base.
IMPRESSION: Chronic cardiomegaly and interstitial coarsening. No significant
change compared to multiple recent priors.

## 2019-06-04 IMAGING — CR ABDOMEN - 1 VIEW
2 series · 2 of 2 positions shown · non-contrast
Comparison: Abdominal radiograph 07/18/2018.

CLINICAL DATA: 57-year-old male with history of abdominal pain and
swelling. Constipation.

EXAM:
ABDOMEN - 1 VIEW

[t abdomen supine (1 of 2)]
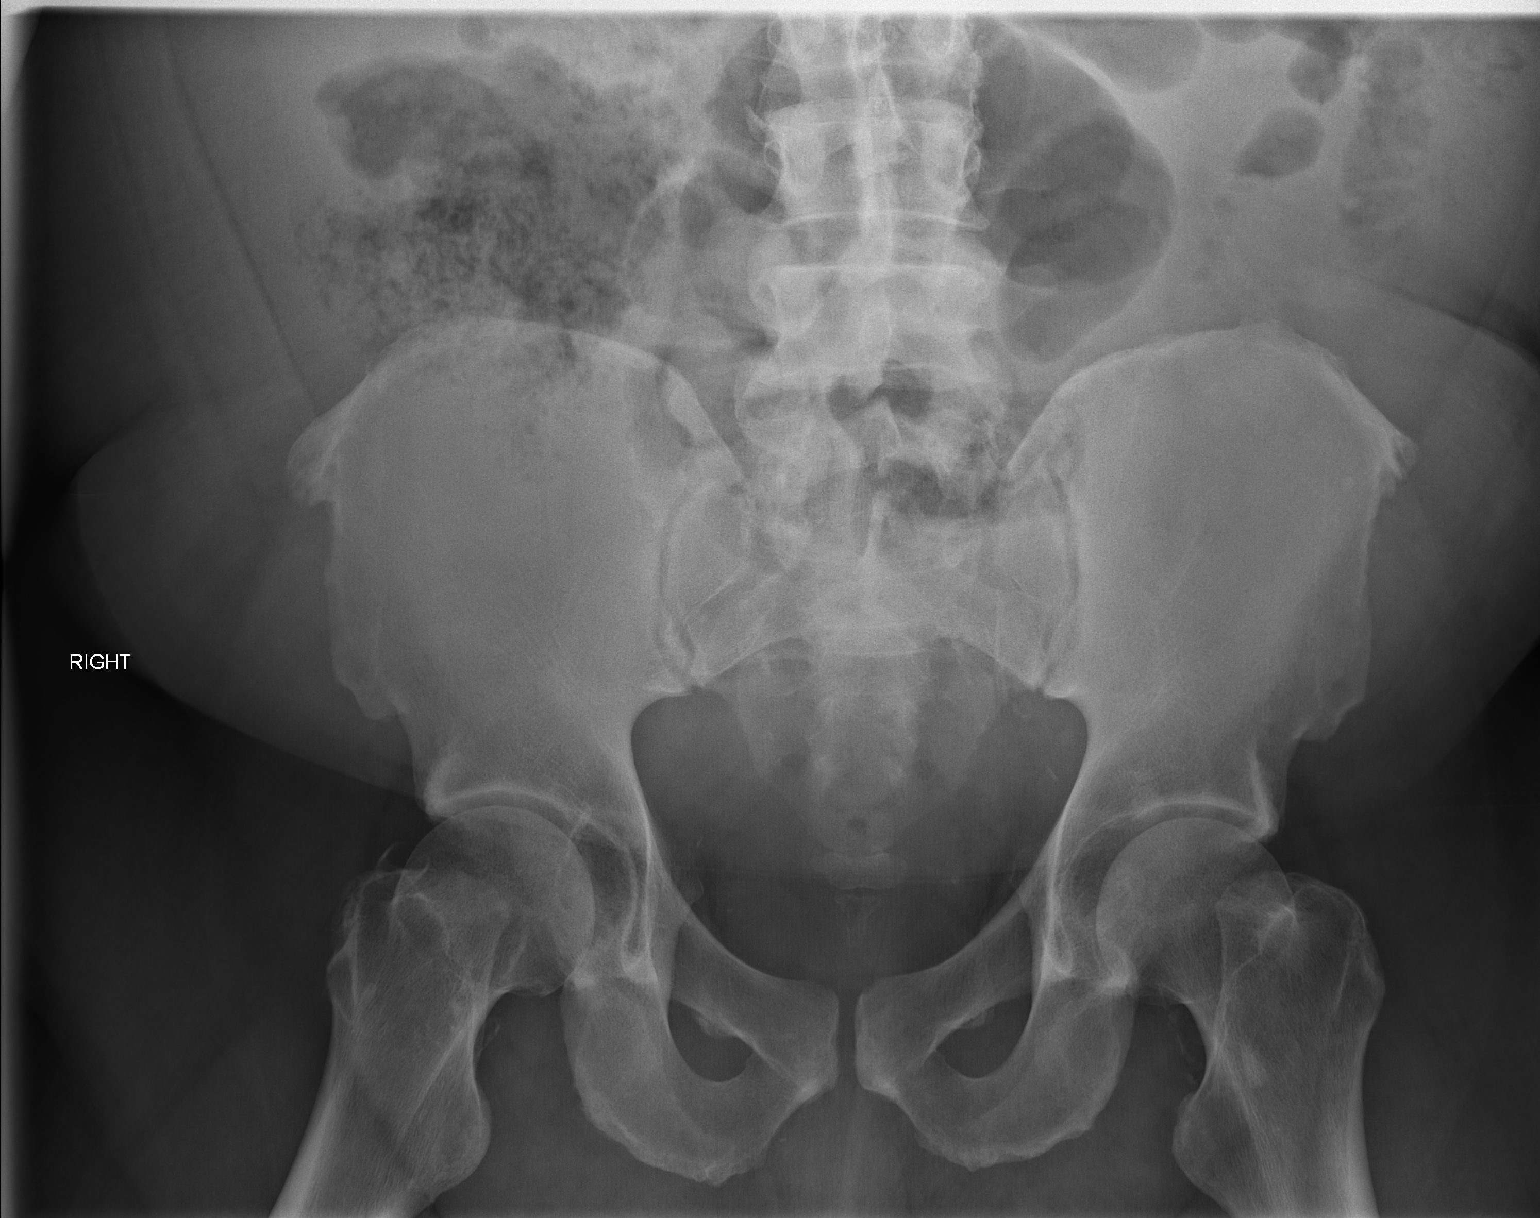

[t abdomen supine (2 of 2)]
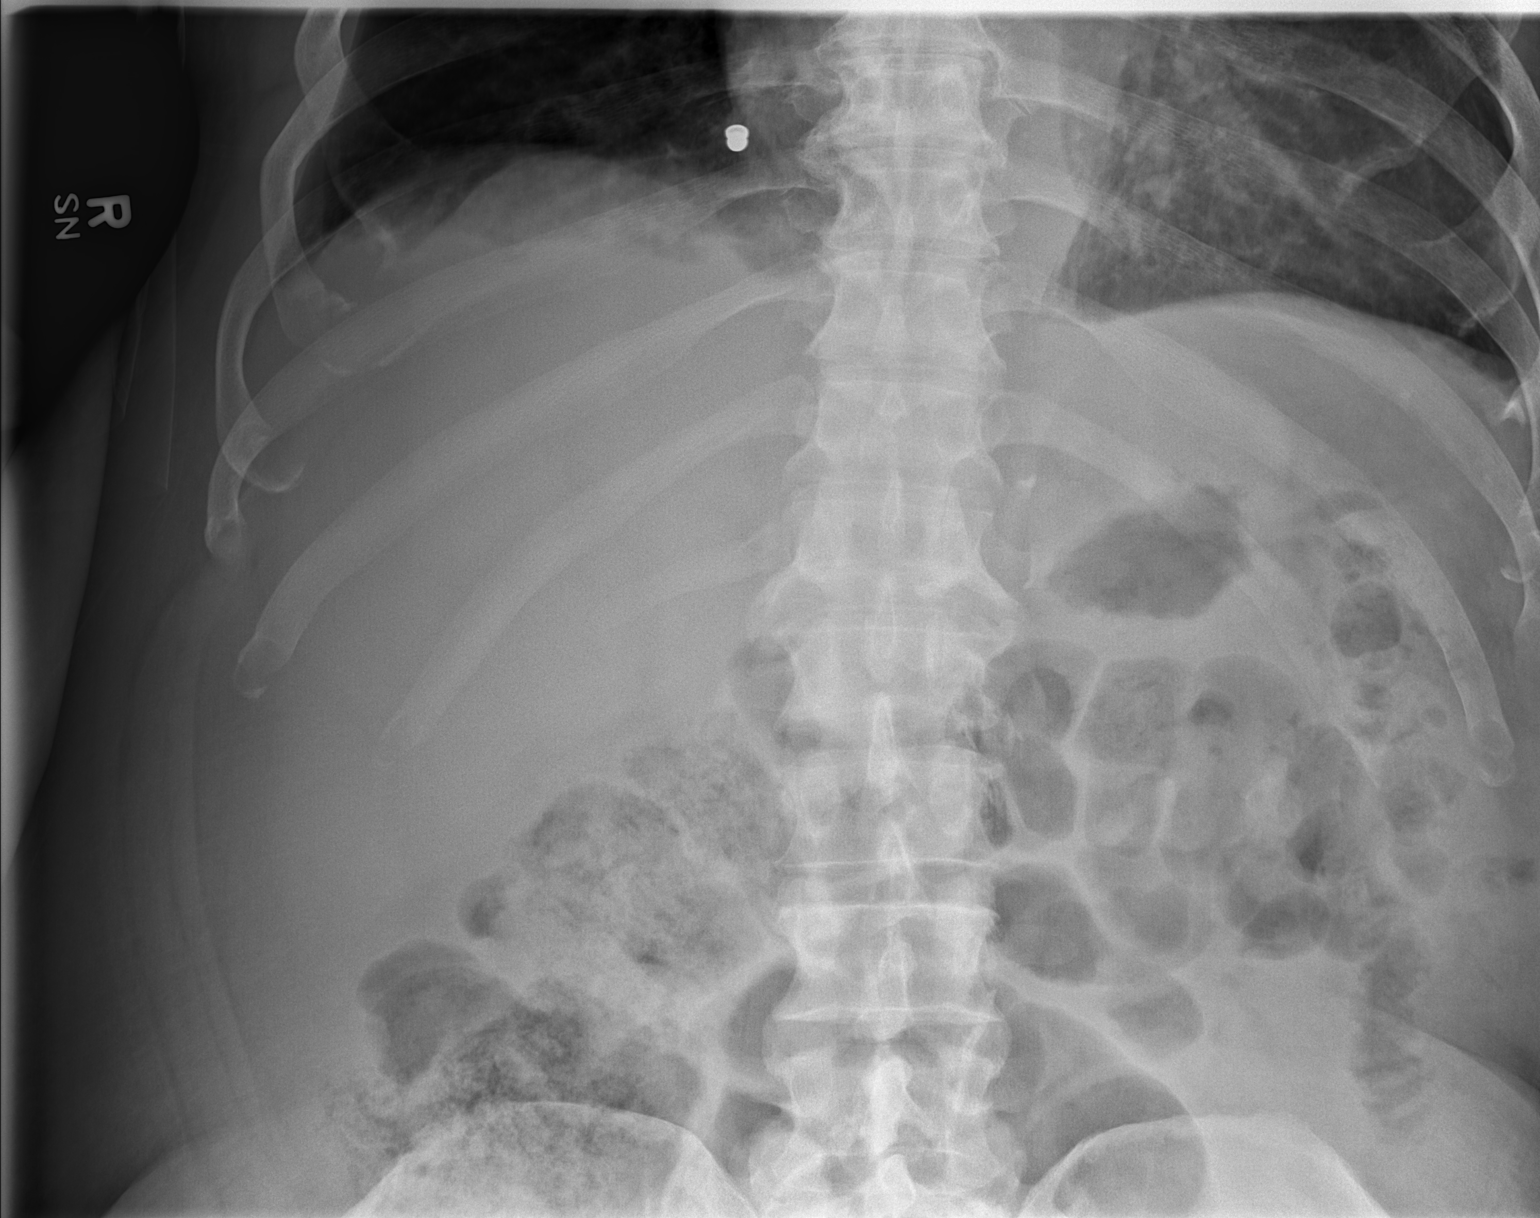

[2 of 2 positions shown; findings below may reference images not displayed]

FINDINGS: Gas and stool are noted throughout the colon. Relatively large
volume of stool in the region of the cecum and ascending colon.
Paucity of distal rectal gas and stool. No pathologic dilatation of
small bowel. No gross pneumoperitoneum noted on today's supine
images.
IMPRESSION: 1. Nonspecific nonobstructive bowel gas pattern, as above. Large
volume of stool in the cecum and proximal ascending colon compatible
with the reported clinical history of constipation.
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
## 2019-06-15 IMAGING — CR CHEST - 2 VIEW
2 series · 2 of 2 positions shown · non-contrast
Comparison: Two days ago

CLINICAL DATA: Shortness of breath

EXAM:
CHEST - 2 VIEW

[w chest lat]
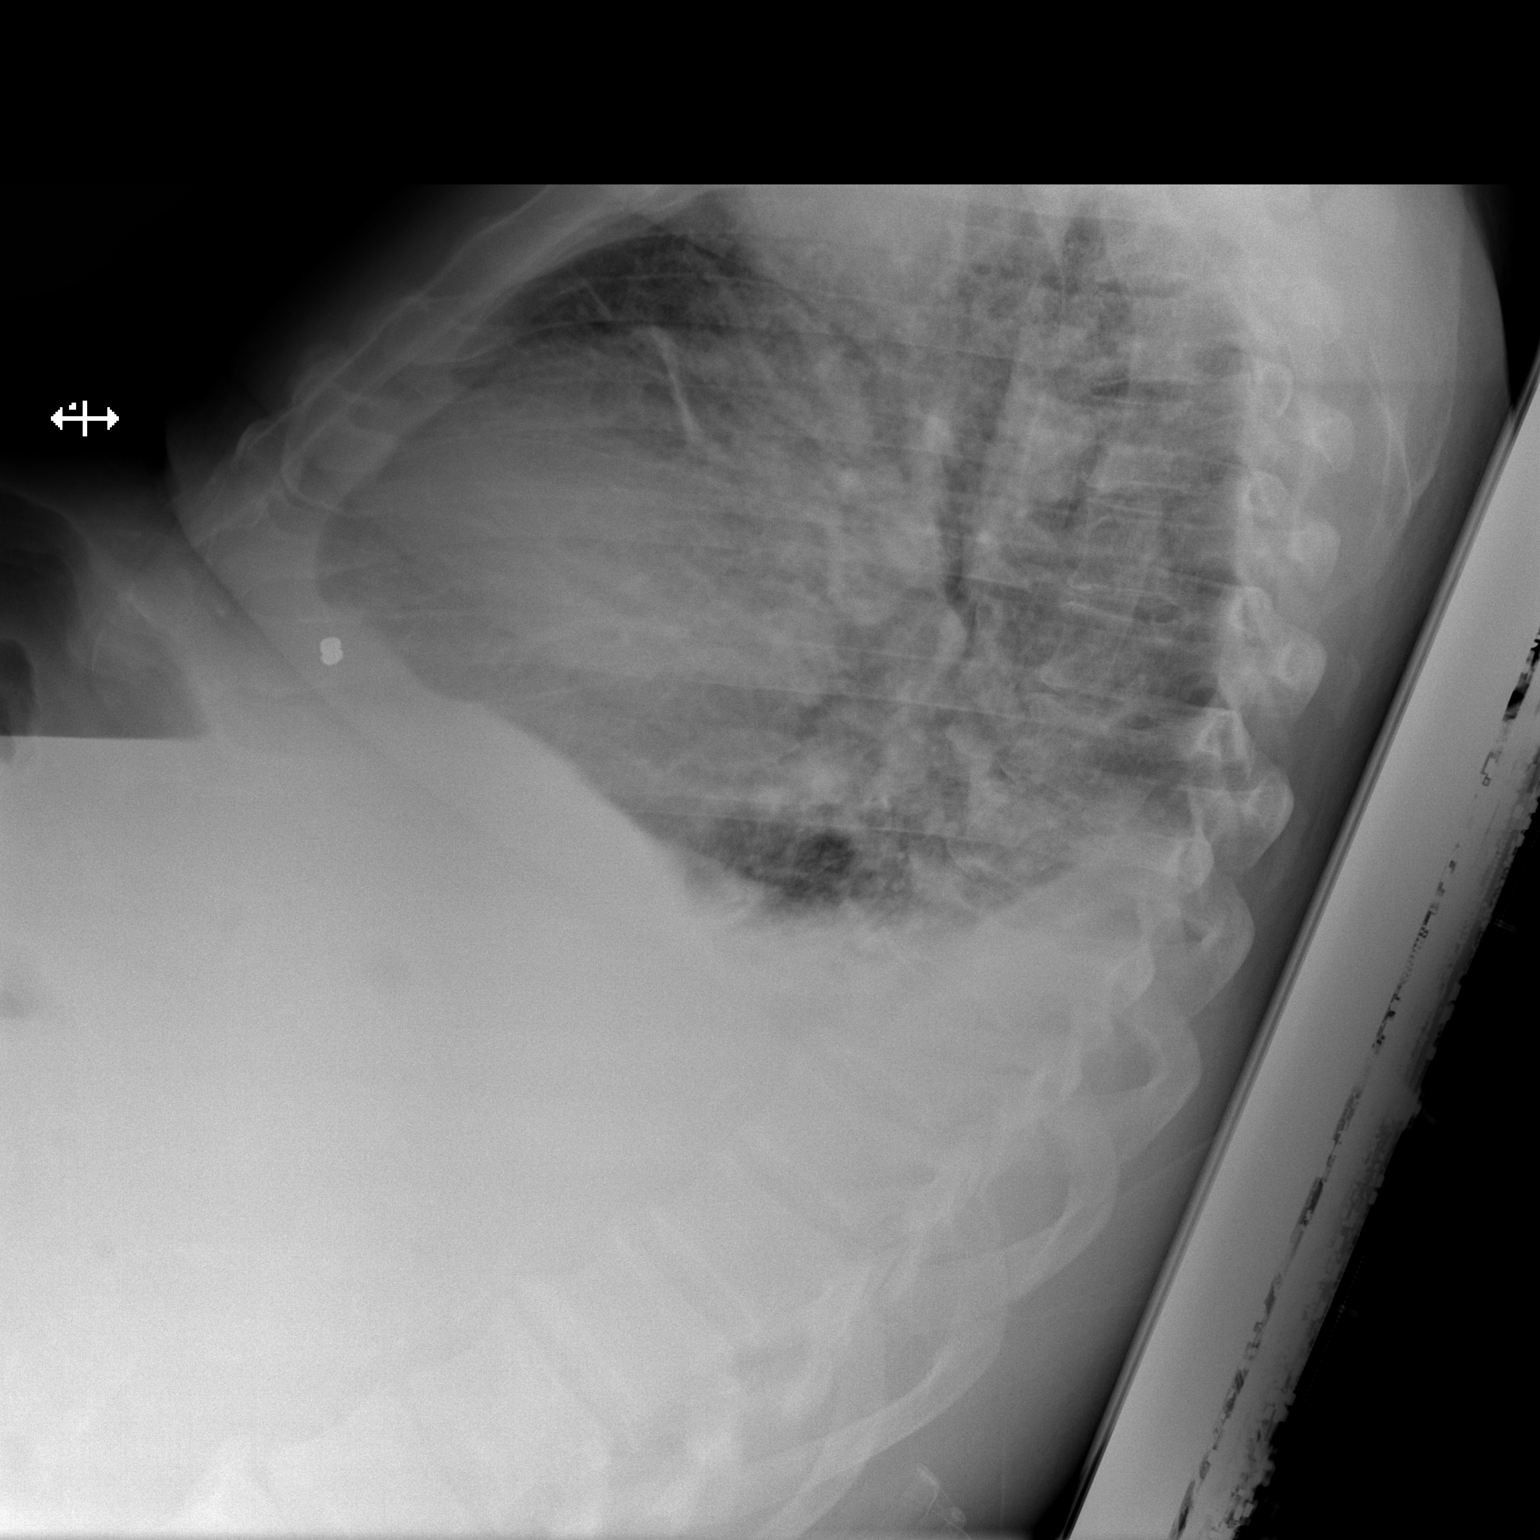

[x chest ap]
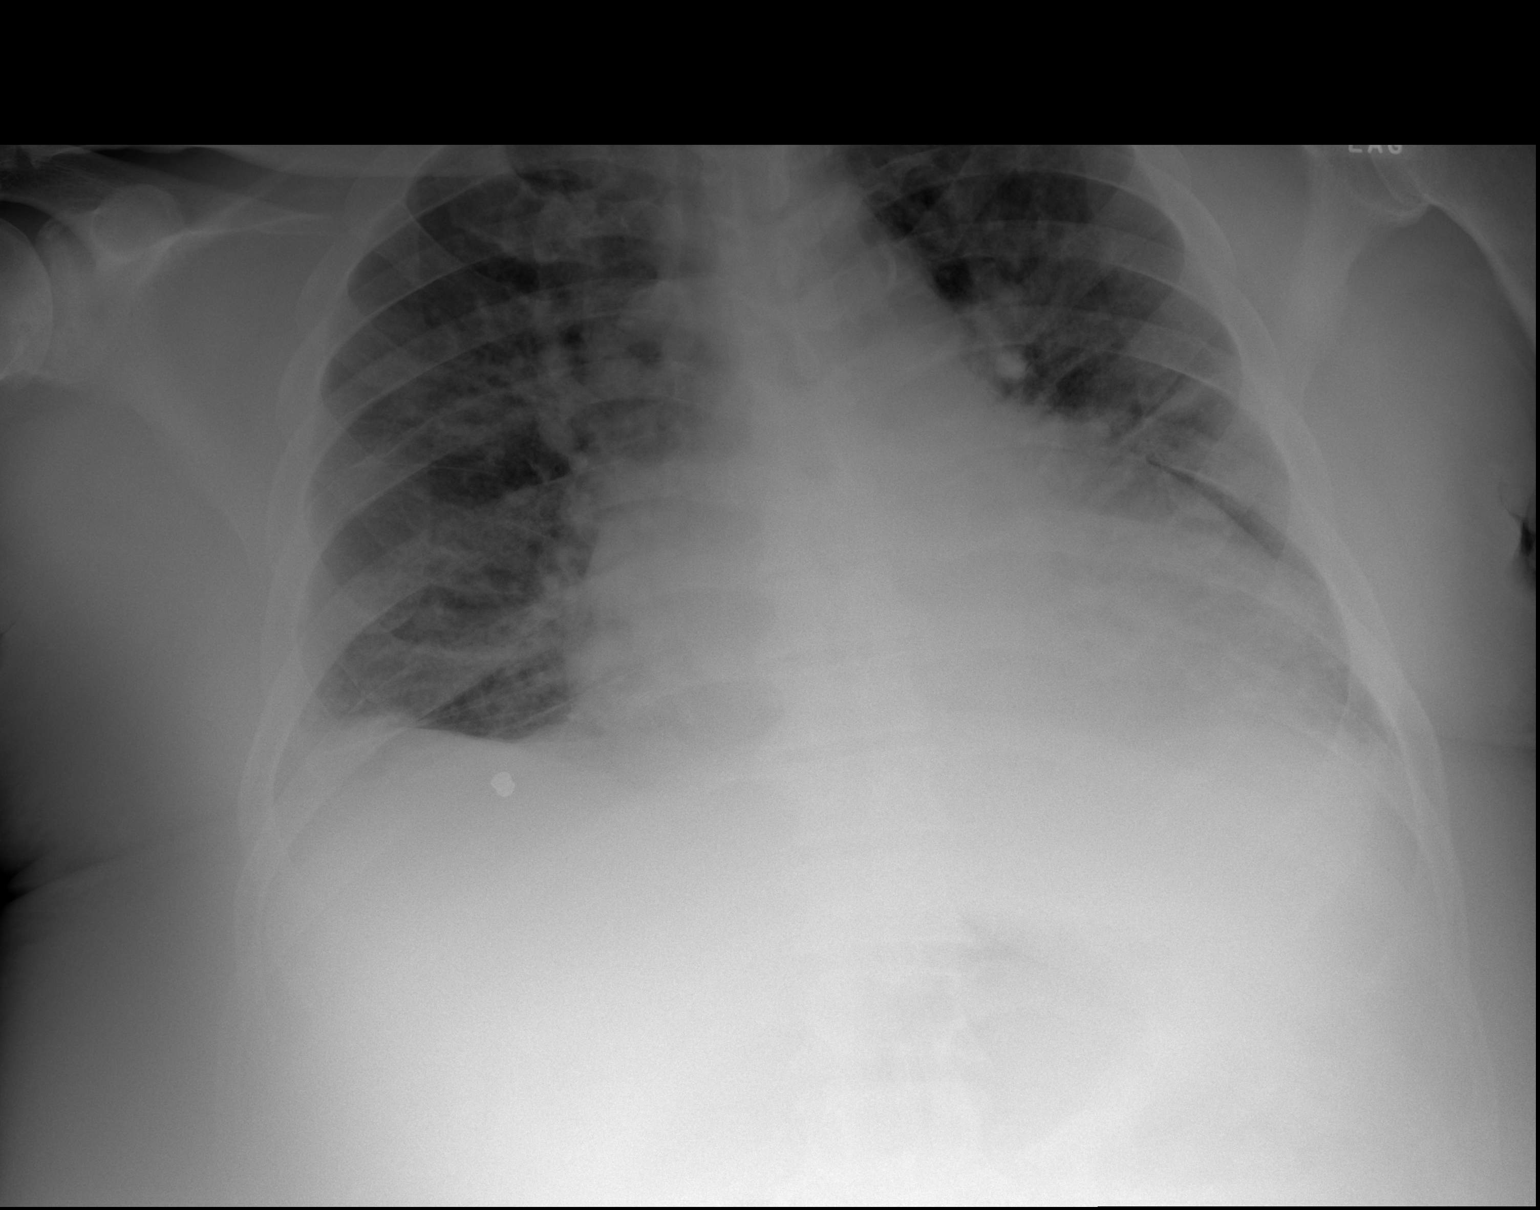

[2 of 2 positions shown; findings below may reference images not displayed]

FINDINGS: Chronic cardiomegaly and vascular pedicle widening with cephalized
blood flow and interstitial coarsening. There are small pleural
effusions.
IMPRESSION: CHF pattern.
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
## 2019-06-23 IMAGING — DX PORTABLE CHEST - 1 VIEW
2 series · 2 of 2 positions shown · non-contrast
Comparison: 12/04/2018

CLINICAL DATA: Shortness of breath

EXAM:
PORTABLE CHEST 1 VIEW

[chest ap (1 of 2)]
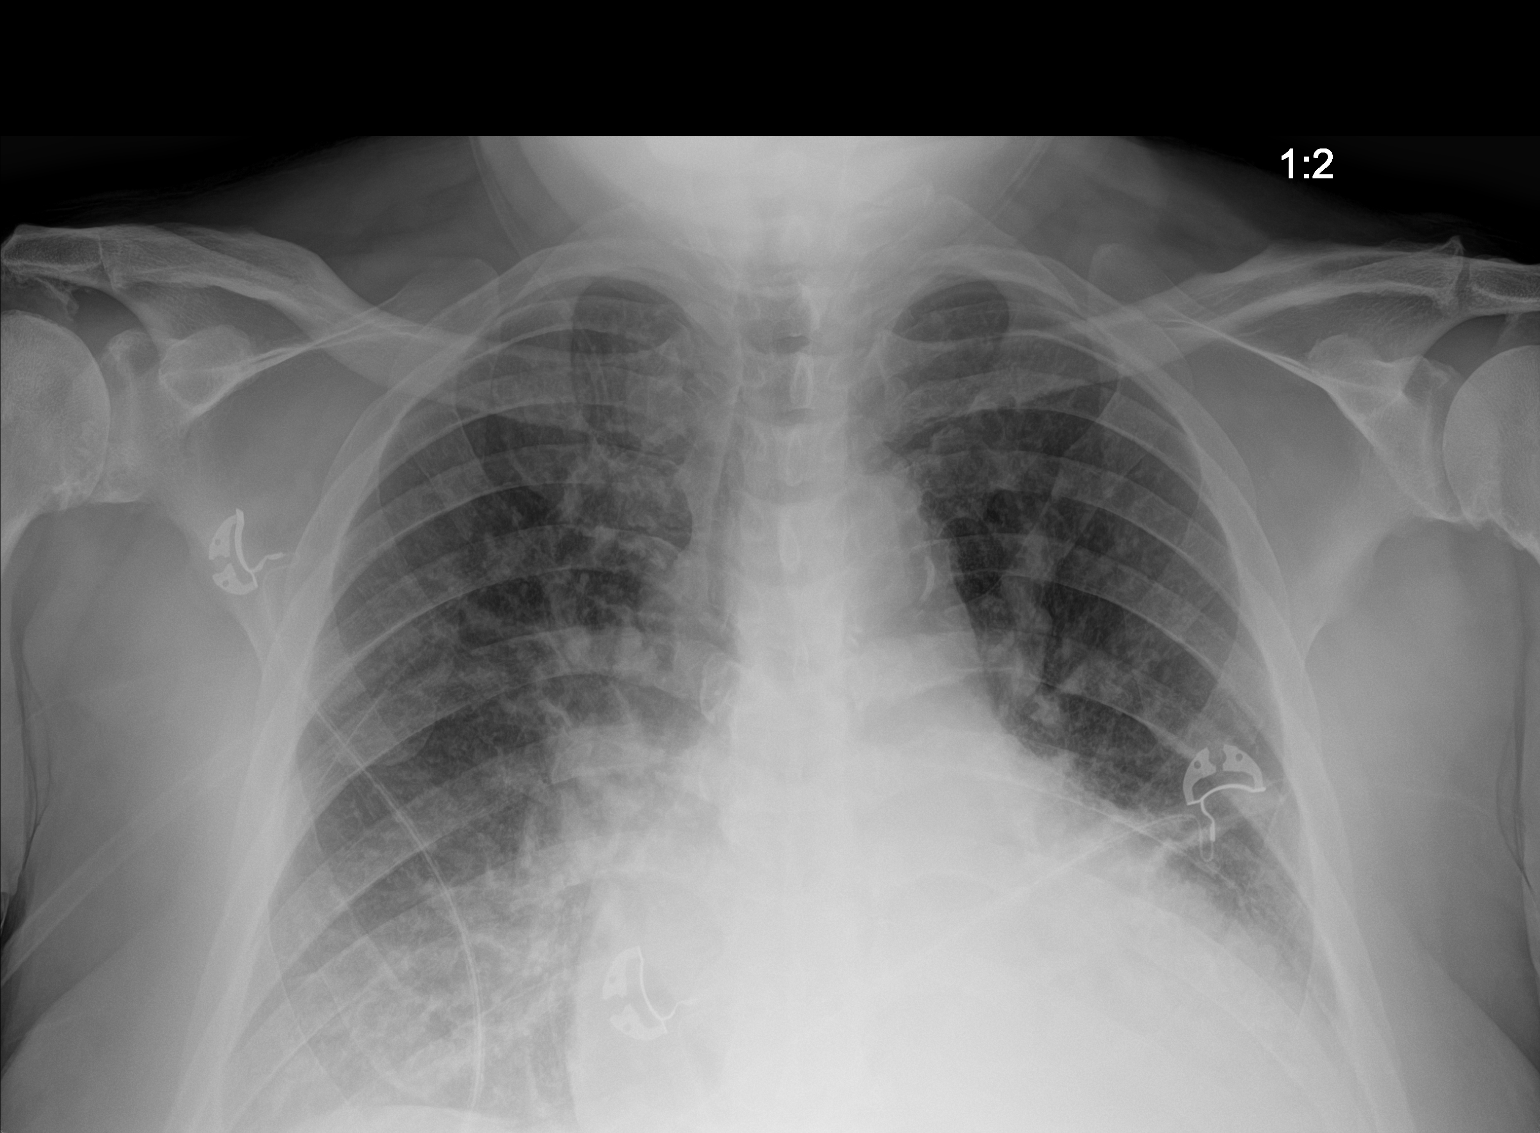

[chest ap (2 of 2)]
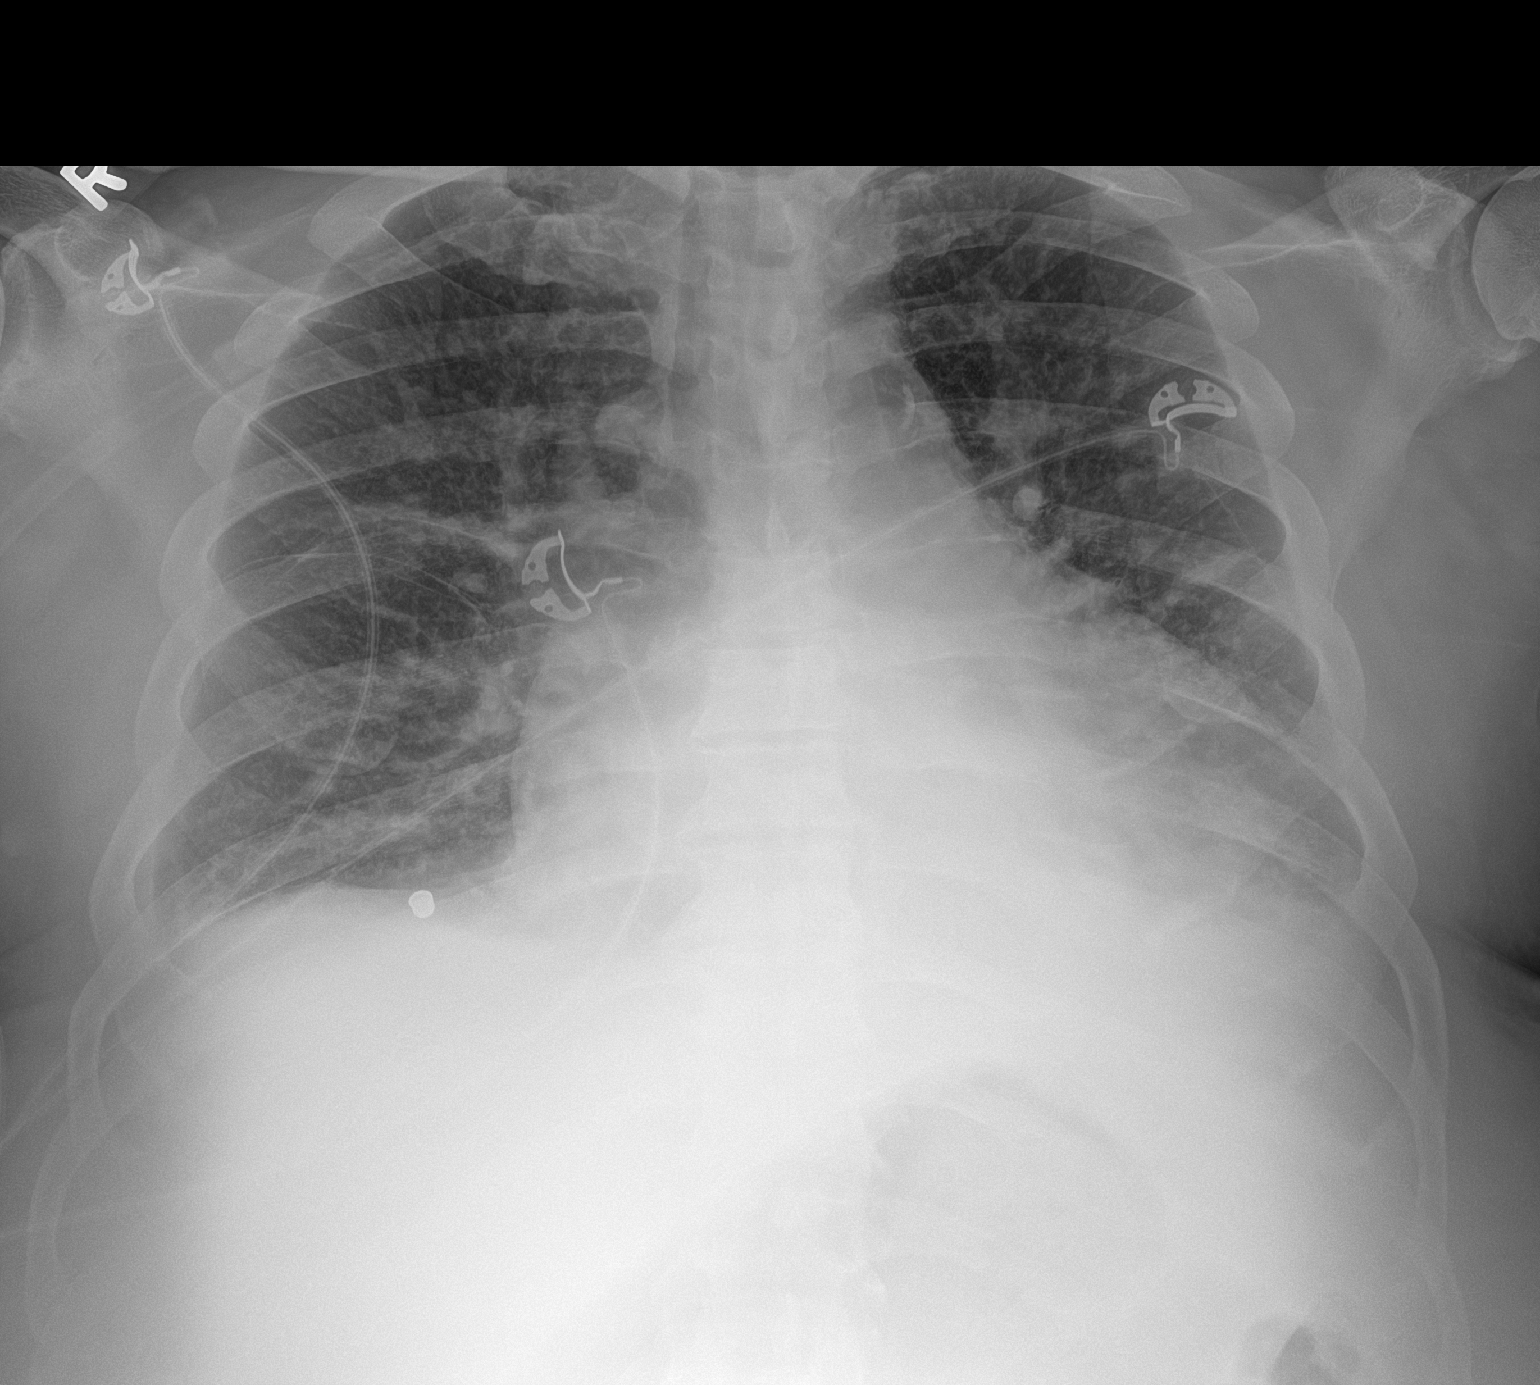

[2 of 2 positions shown; findings below may reference images not displayed]

FINDINGS: There is cardiomegaly with findings of pulmonary edema. There are
likely small bilateral pleural effusions. There is likely adjacent
compressive atelectasis. There is no pneumothorax. No acute osseous
abnormality.
IMPRESSION: Cardiomegaly with pulmonary edema. There are persistent small
bilateral pleural effusions.
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
## 2019-06-29 IMAGING — DX PORTABLE CHEST - 1 VIEW
1 series · 1 of 1 positions shown · non-contrast
Comparison: One-view chest x-ray 12/13/2018

CLINICAL DATA: Shortness of breath.  Hypoxia.  Abdominal swelling.

EXAM:
PORTABLE CHEST 1 VIEW

[chest ap]
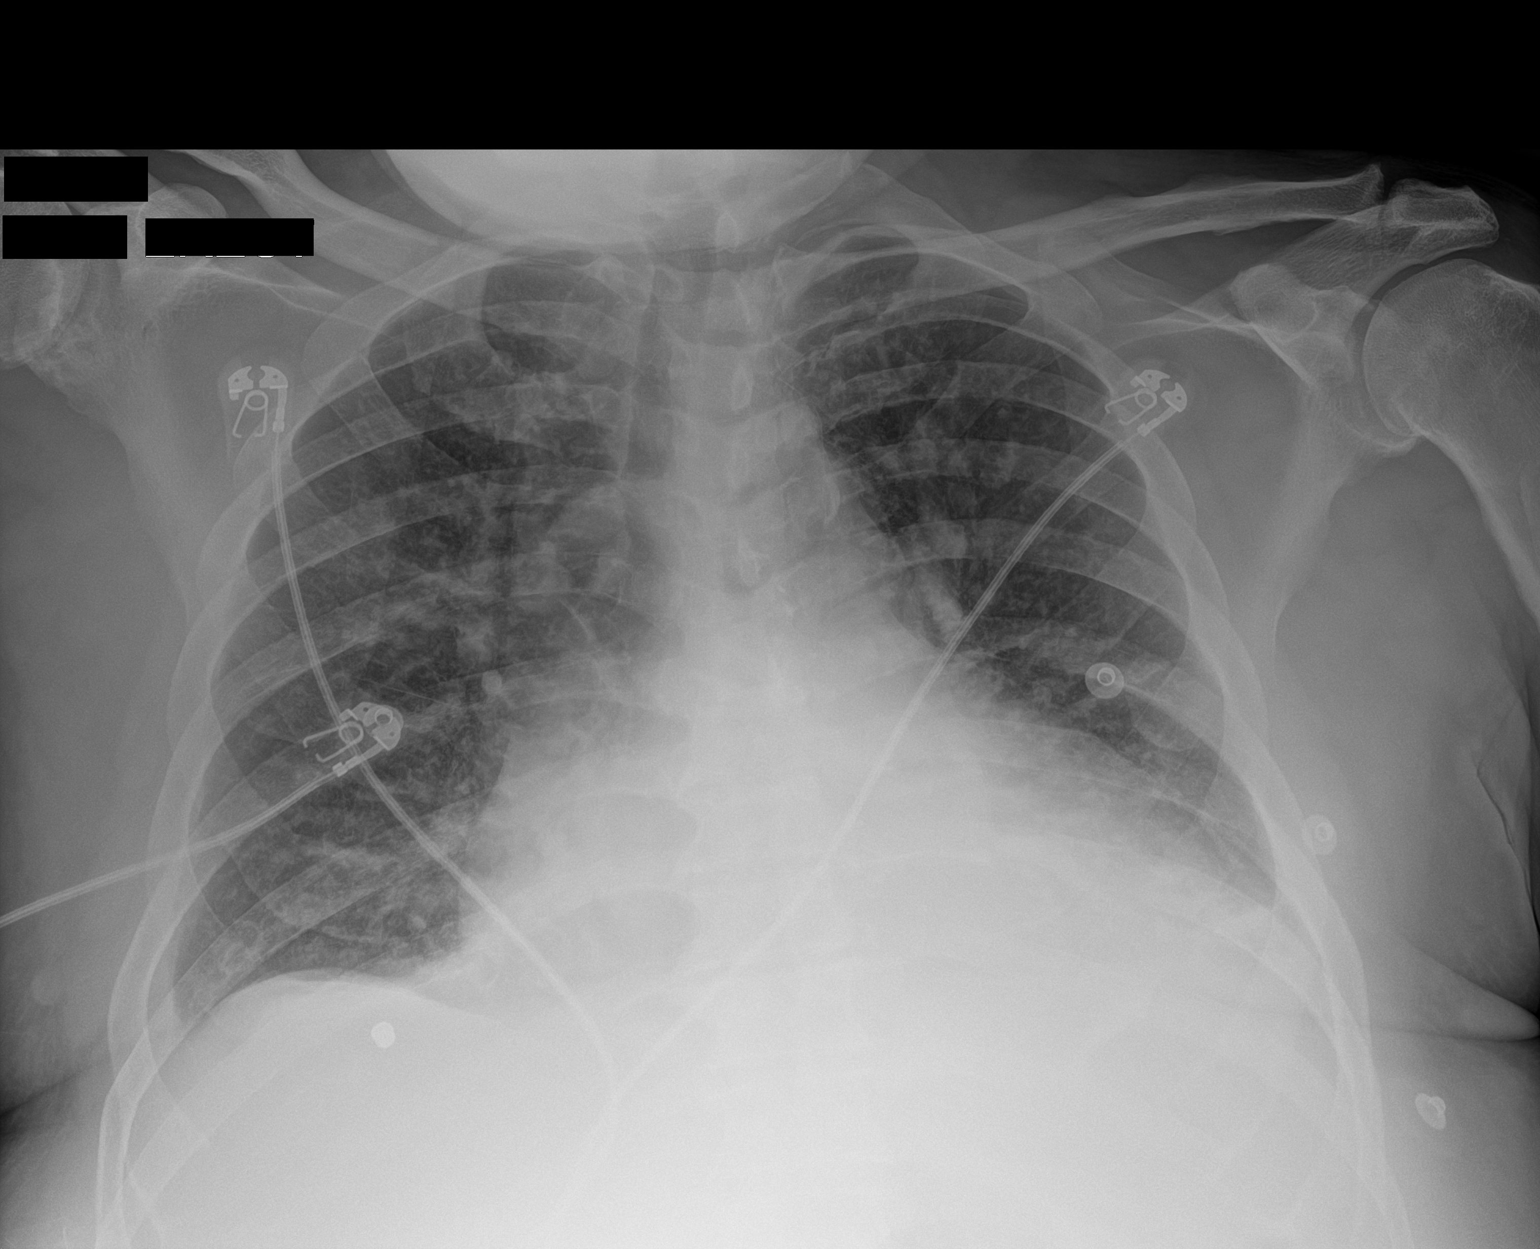

[1 of 1 positions shown; findings below may reference images not displayed]

FINDINGS: Heart is enlarged. Lung volumes are low. Mild diffuse edema is
present. Small effusions are present. Bibasilar airspace disease is
noted, left greater than right.
IMPRESSION: 1. Cardiomegaly with increasing interstitial edema and bilateral
pleural effusions consistent with congestive heart failure.
2. Bibasilar airspace disease likely reflects atelectasis, left
greater than right. Infection is not excluded.
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
## 2019-07-08 IMAGING — CR PORTABLE CHEST - 1 VIEW
1 series · 1 of 1 positions shown · non-contrast
Comparison: Chest radiograph dated 12/22/2018

CLINICAL DATA: 57-year-old male with shortness of breath

EXAM:
PORTABLE CHEST 1 VIEW

[AP]
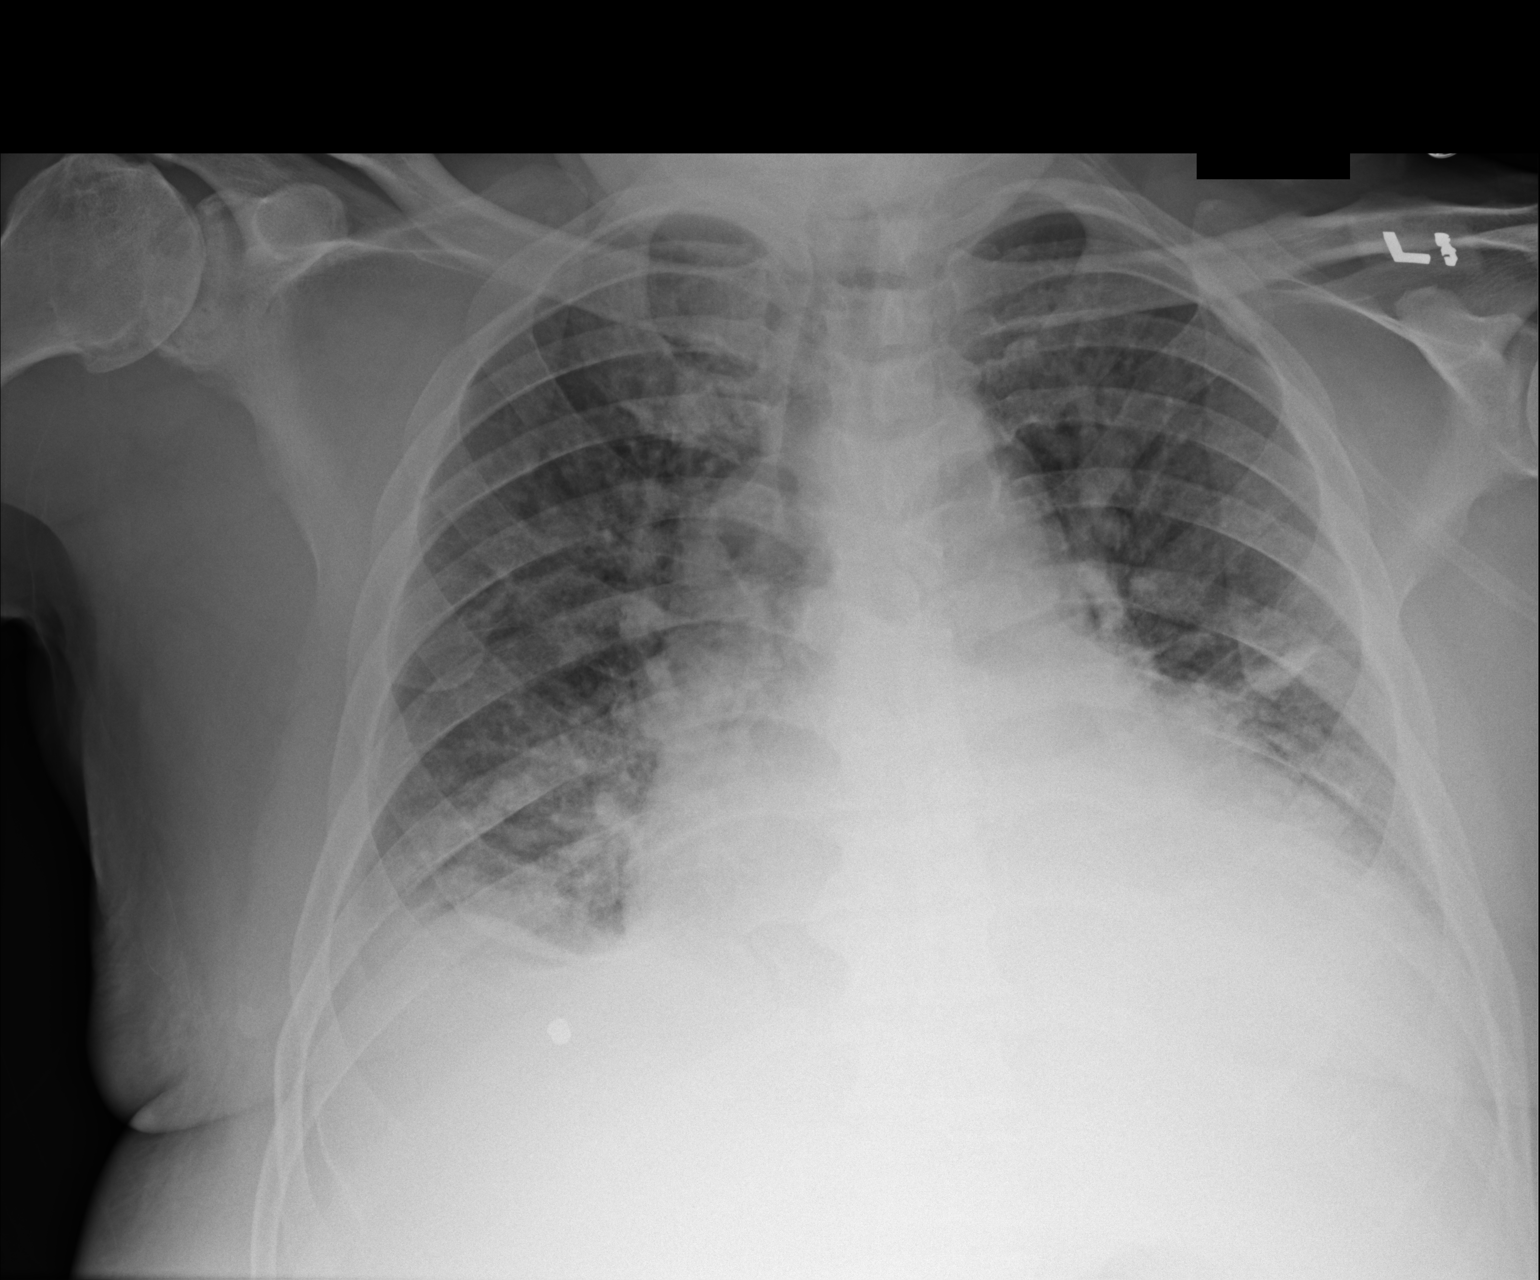

[1 of 1 positions shown; findings below may reference images not displayed]

FINDINGS: There is cardiomegaly with vascular congestion and probable small
bilateral pleural effusions. There are bibasilar atelectasis.
Pneumonia is not excluded. There is no pneumothorax. No acute
osseous pathology. Stable small radiopaque foreign object over the
right diaphragm.
IMPRESSION: Cardiomegaly with findings of CHF and small bilateral pleural
effusions, slightly worsened since the prior radiograph. Pneumonia
is not excluded.

## 2019-07-14 IMAGING — DX PORTABLE CHEST - 1 VIEW
1 series · 1 of 1 positions shown · non-contrast
Comparison: Radiograph December 25, 2018.

CLINICAL DATA: Shortness of breath.

EXAM:
PORTABLE CHEST 1 VIEW

[chest ap]
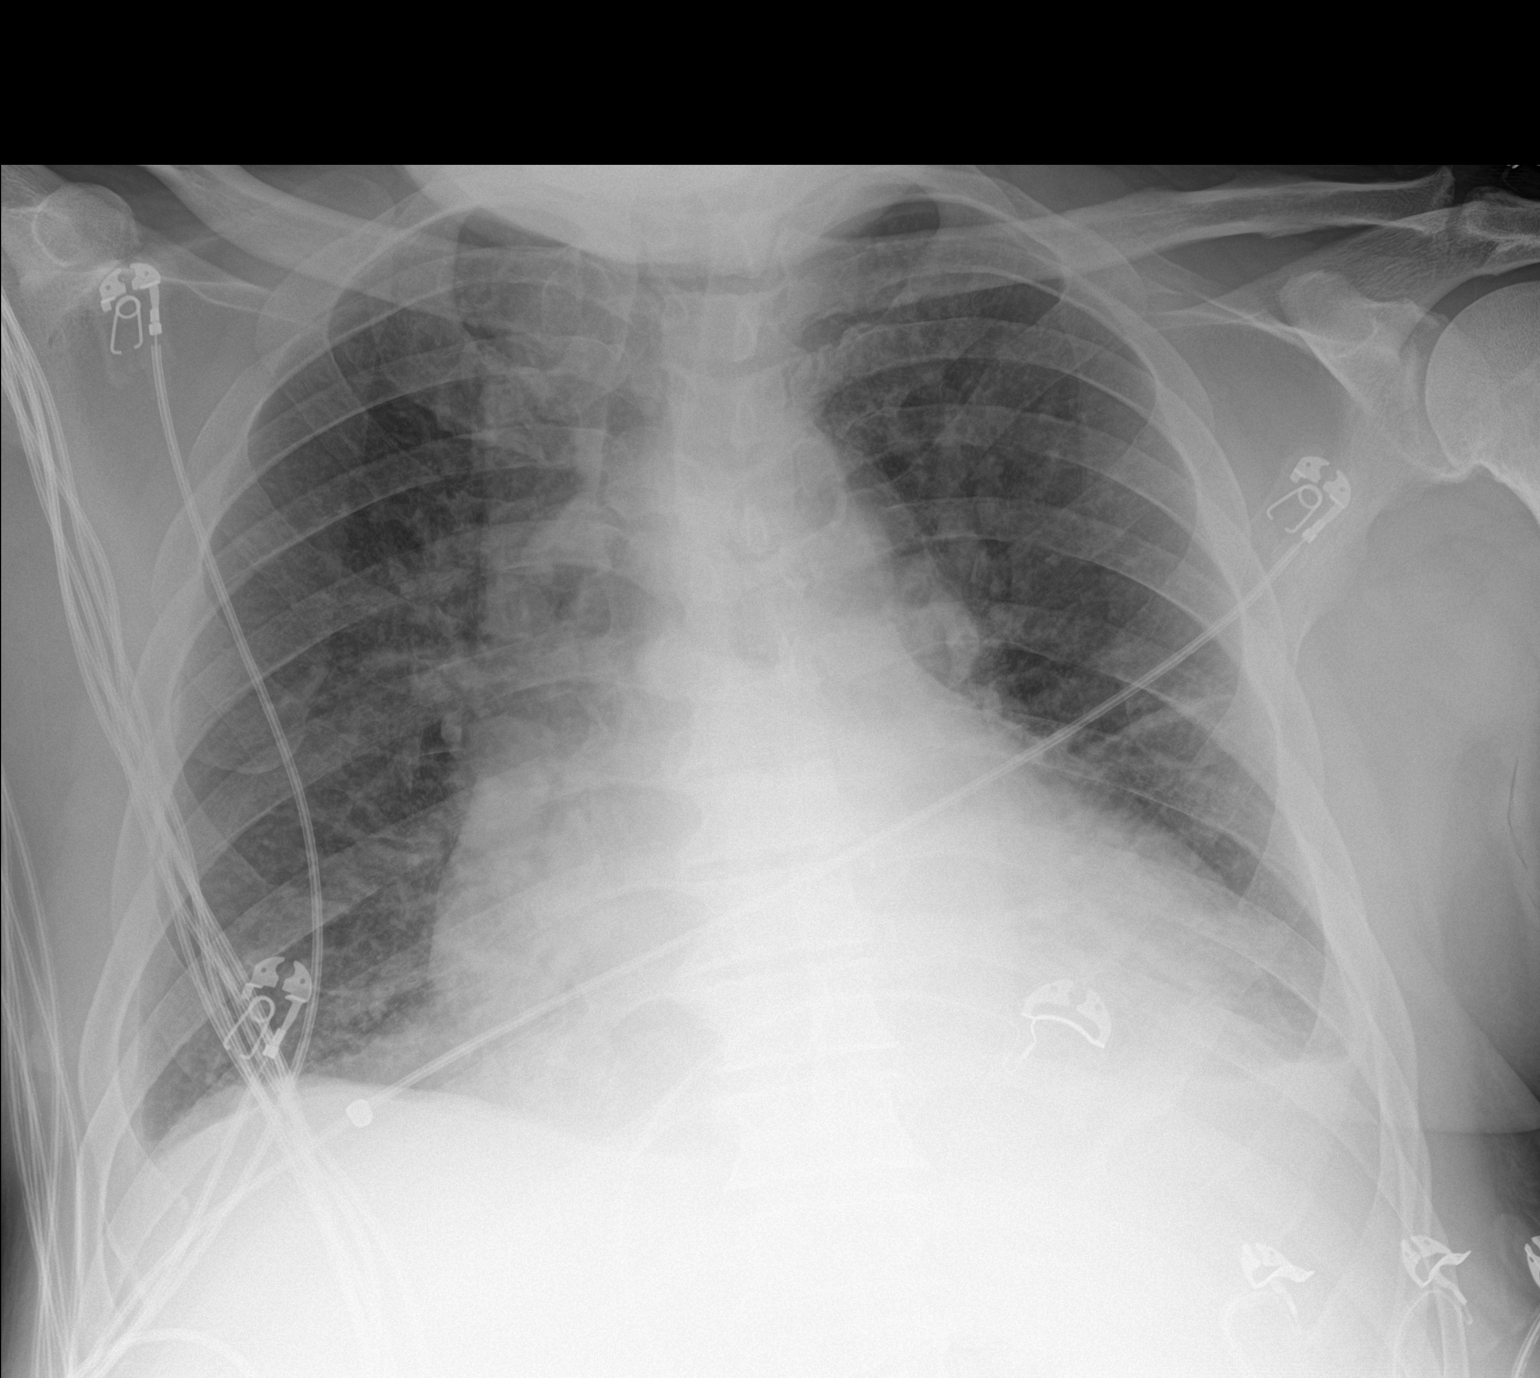

[1 of 1 positions shown; findings below may reference images not displayed]

FINDINGS: Stable cardiomegaly with central pulmonary vascular congestion is
noted. No pneumothorax is noted. Mild bibasilar subsegmental
atelectasis is noted with minimal pleural effusions. Bony thorax is
unremarkable.
IMPRESSION: Stable cardiomegaly with central pulmonary vascular congestion. Mild
bibasilar subsegmental atelectasis is noted with minimal pleural
effusions.
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
## 2019-07-16 IMAGING — DX PORTABLE CHEST - 1 VIEW
1 series · 1 of 1 positions shown · non-contrast
Comparison: Chest x-ray dated December 31, 2018.

CLINICAL DATA: Cough, shortness of breath, leg swelling.

EXAM:
PORTABLE CHEST 1 VIEW

[chest ap]
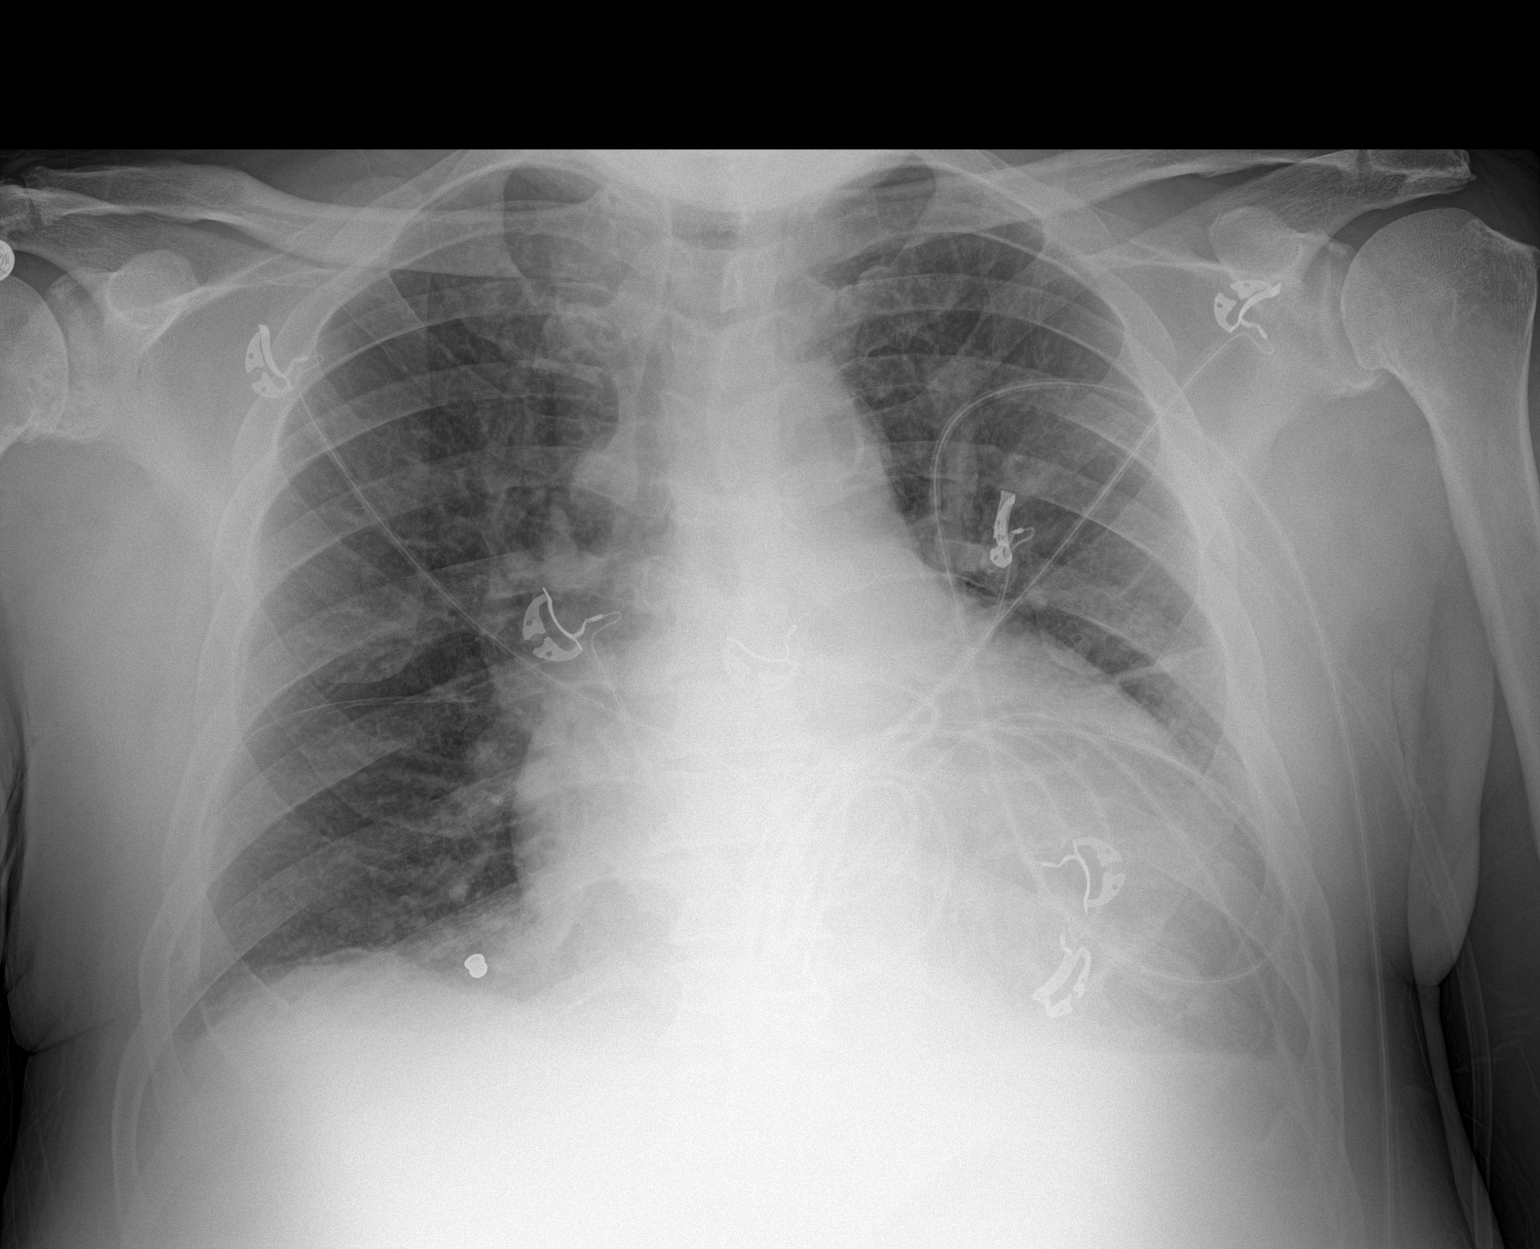

[1 of 1 positions shown; findings below may reference images not displayed]

FINDINGS: Stable cardiomegaly. Unchanged mild pulmonary vascular congestion.
Unchanged trace bilateral pleural effusions and mild bibasilar
atelectasis. No consolidation or pneumothorax. No acute osseous
abnormality.
IMPRESSION: 1. Unchanged mild pulmonary vascular congestion and trace bilateral
pleural effusions.

## 2019-07-17 IMAGING — US ULTRASOUND ABDOMEN LIMITED
1 series · 5 of 5 positions shown · non-contrast
Comparison: None.

CLINICAL DATA: Abdominal distension.

EXAM:
LIMITED ABDOMEN ULTRASOUND FOR ASCITES
TECHNIQUE: Limited ultrasound survey for ascites was performed in all four
abdominal quadrants.

[Series 1: ultrasound abdomen limited · 5 of 5 slices shown]
[im 1/5]
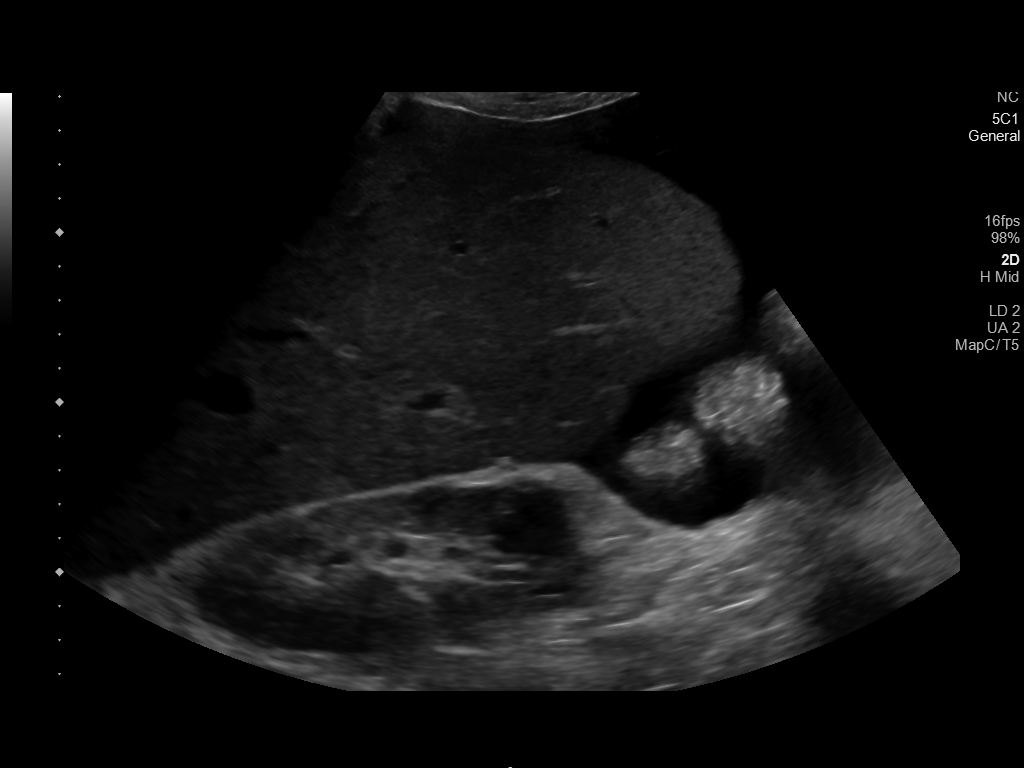
[im 2/5]
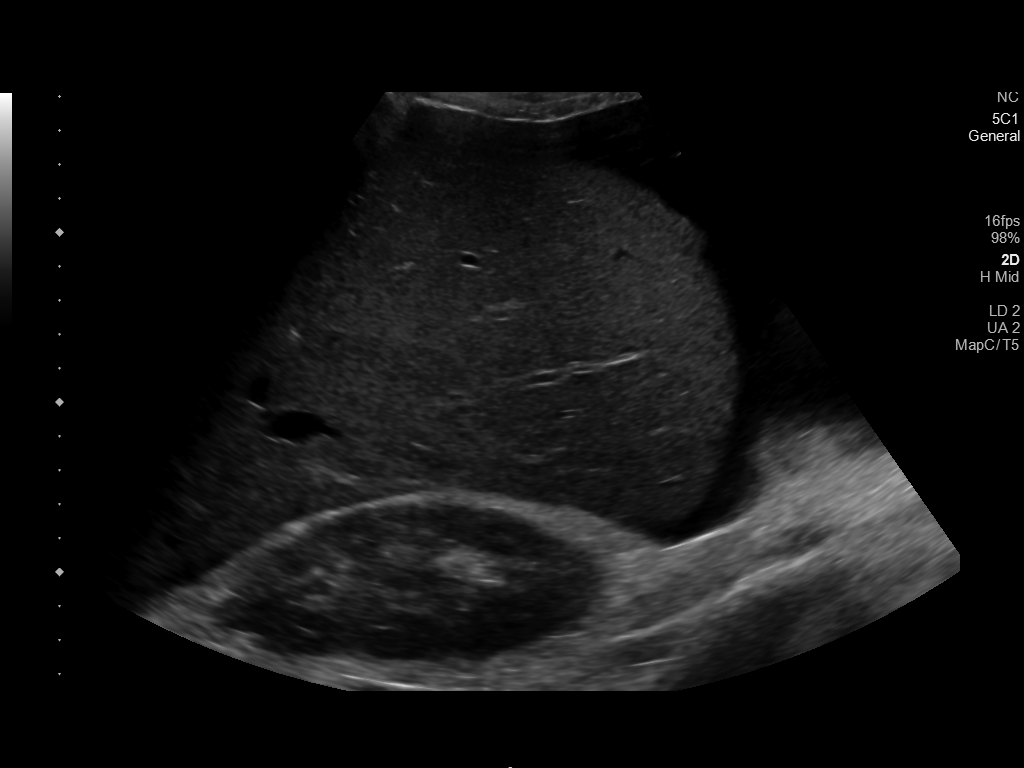
[im 3/5]
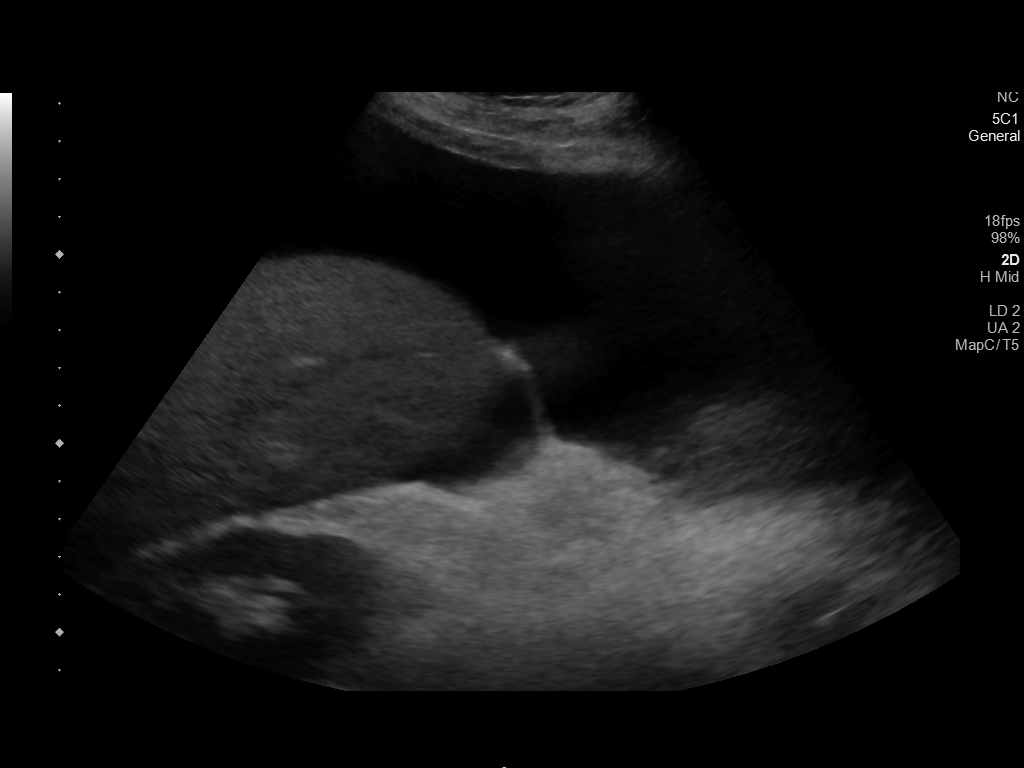
[im 4/5]
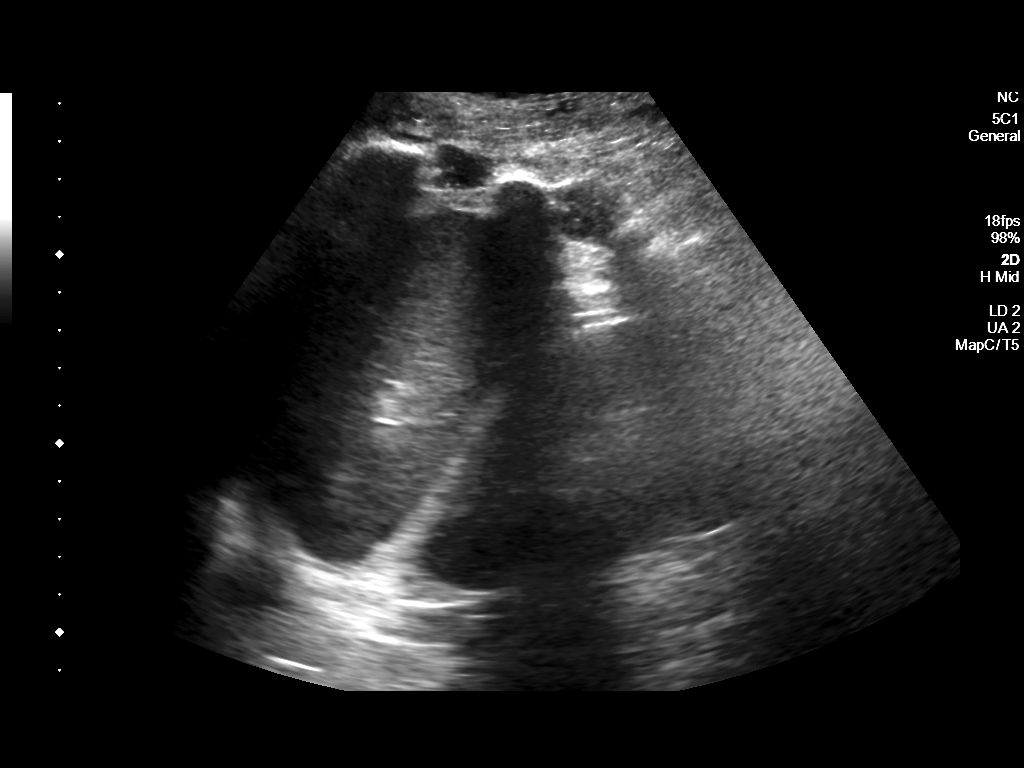
[im 5/5]
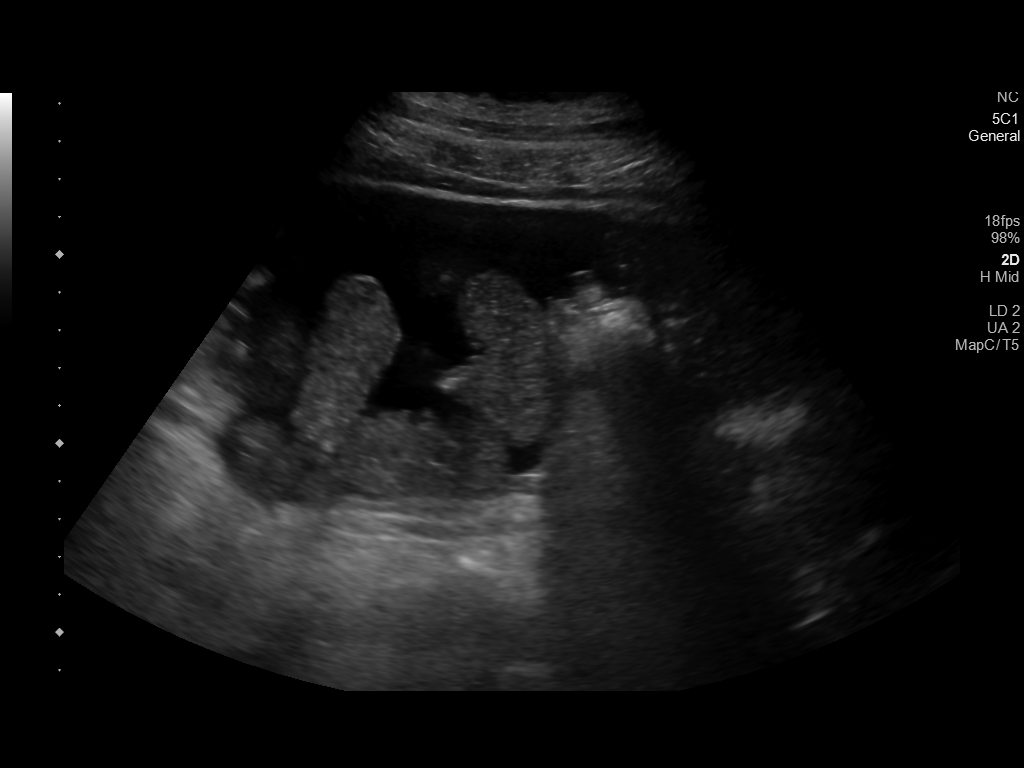

[5 of 5 positions shown; findings below may reference images not displayed]

FINDINGS: Mild amount of fluid is noted in the left lower quadrant and right
upper quadrant. Mild-to-moderate fluid is noted in the right lower
quadrant. Probable mild fluid is noted in left upper quadrant.
IMPRESSION: Mild to moderate ascites is noted.

## 2019-07-27 IMAGING — DX PORTABLE CHEST - 1 VIEW
1 series · 1 of 1 positions shown · non-contrast
Comparison: 01/02/2019

CLINICAL DATA: Shortness of breath.  Possible fluid overload

EXAM:
PORTABLE CHEST 1 VIEW

[chest ap]
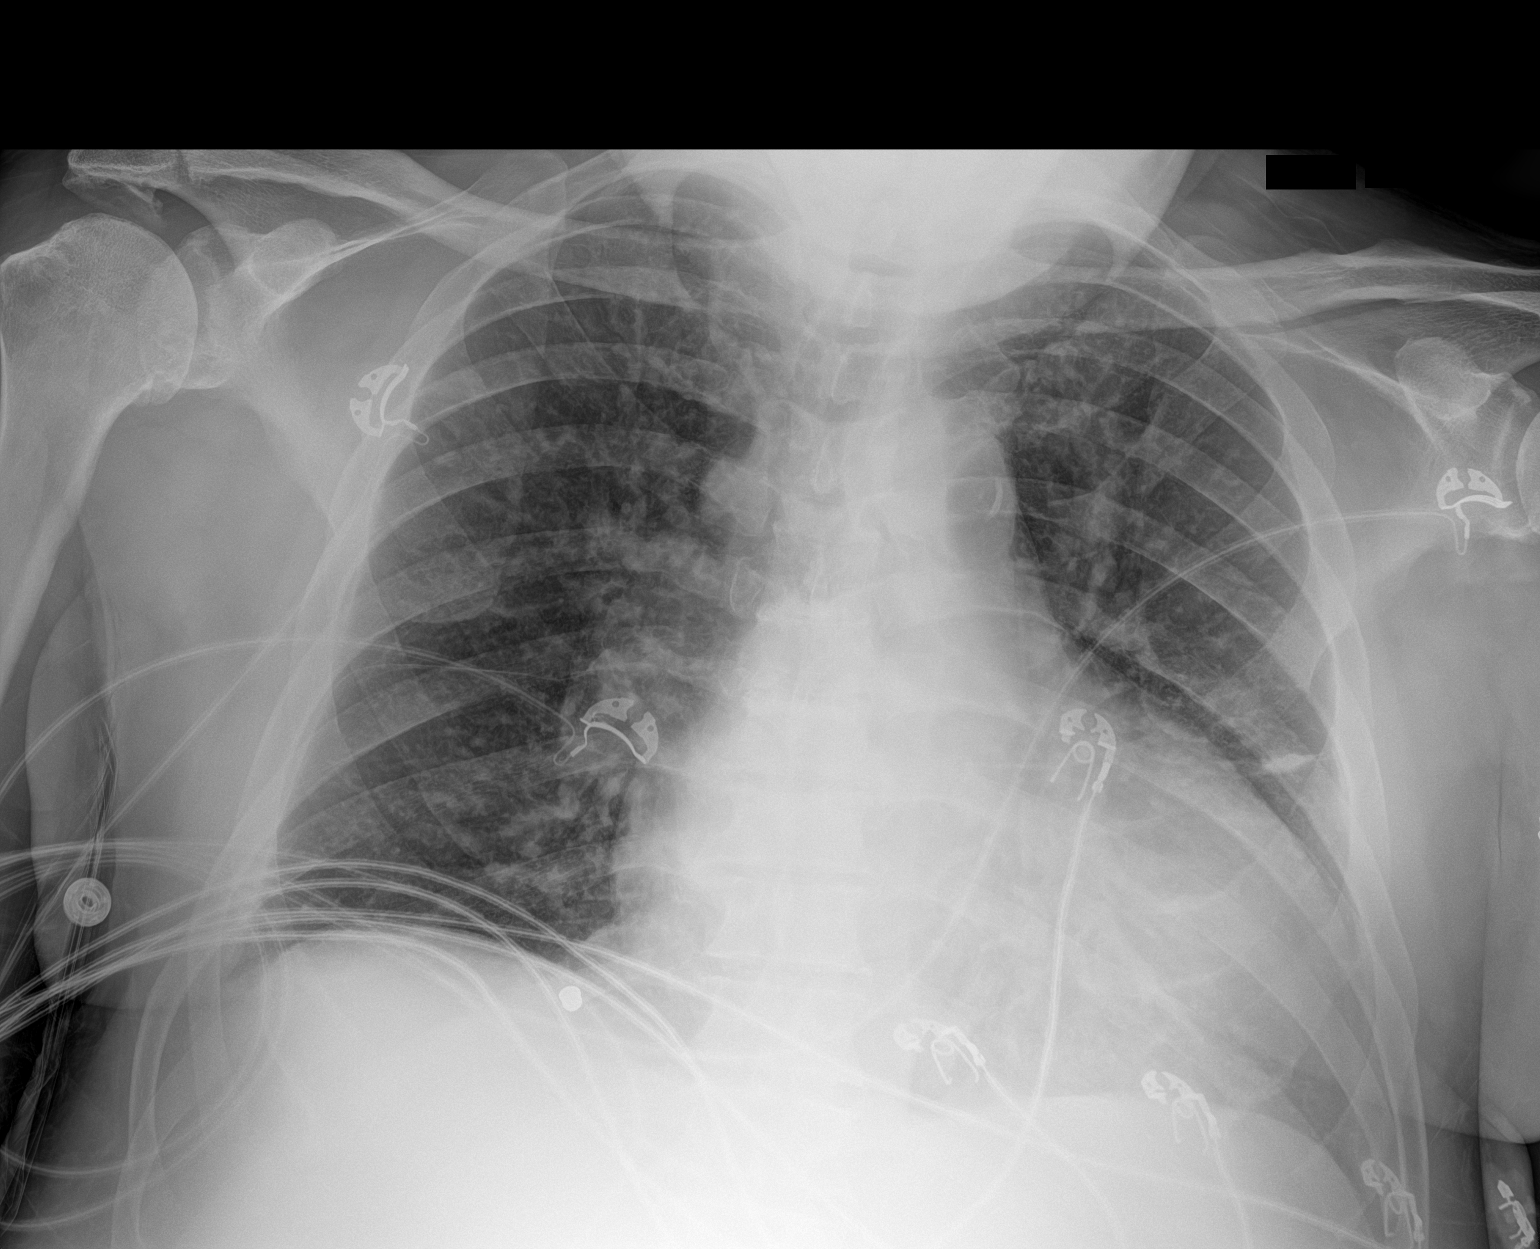

[1 of 1 positions shown; findings below may reference images not displayed]

FINDINGS: Chronic cardiomegaly. There is no edema, consolidation, effusion, or
pneumothorax. Mild scarring in the left mid chest.
IMPRESSION: No acute finding.  Baseline appearance of the chest.

## 2019-07-28 IMAGING — DX PORTABLE CHEST - 1 VIEW
1 series · 1 of 1 positions shown · non-contrast
Comparison: 01/13/2019 and multiple previous

CLINICAL DATA: Edema.  Heart failure.

EXAM:
PORTABLE CHEST 1 VIEW

[chest]
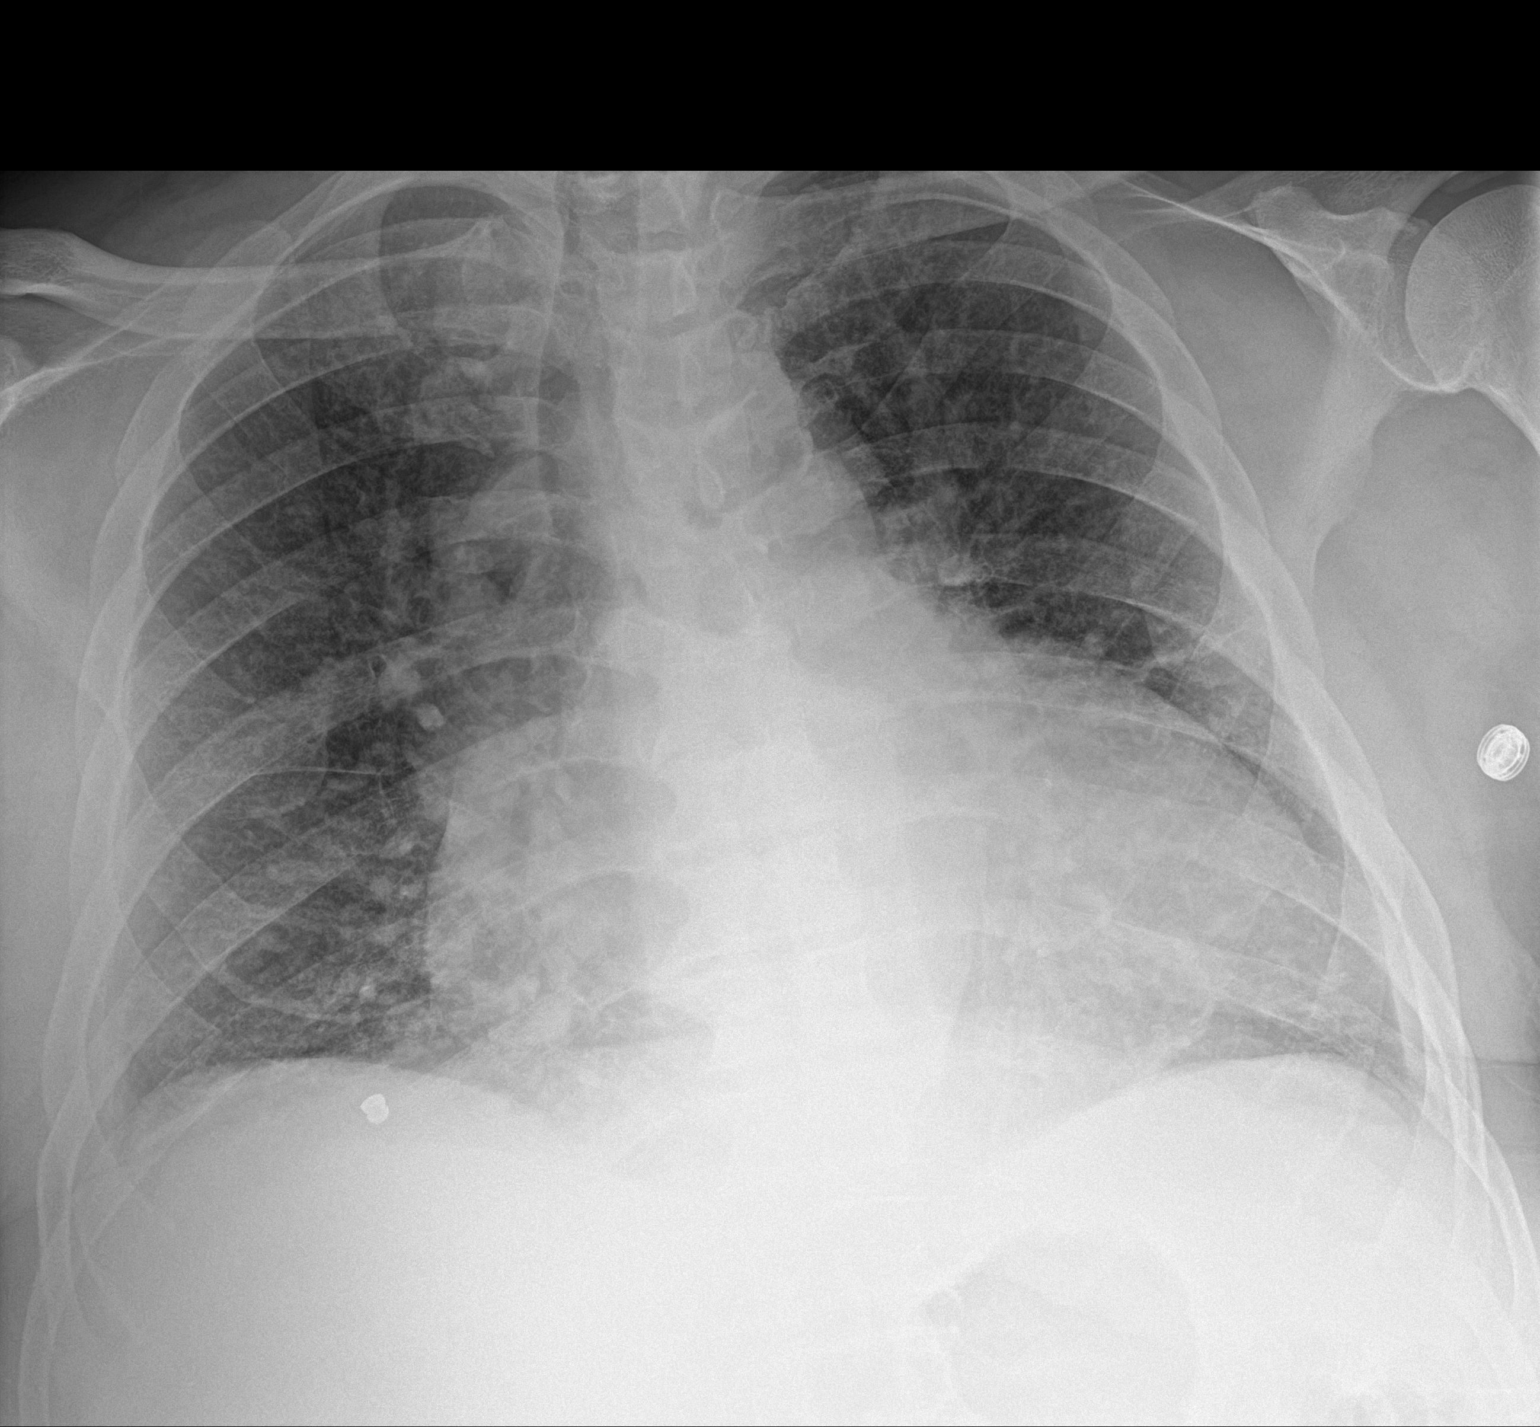

[1 of 1 positions shown; findings below may reference images not displayed]

FINDINGS: Chronic cardiomegaly. Pulmonary venous hypertension with mild
interstitial edema. No infiltrate, collapse or effusion. No acute
bone finding. Metallic foreign object in the right anterior chest.
IMPRESSION: Cardiomegaly, pulmonary venous hypertension and mild interstitial
edema.

## 2019-07-29 IMAGING — DX PORTABLE CHEST - 1 VIEW
1 series · 1 of 1 positions shown · non-contrast
Comparison: 01/14/2019 and 11/13/2018

CLINICAL DATA: Low-grade fever which shortness-of-breath.

EXAM:
PORTABLE CHEST 1 VIEW

[chest ap]
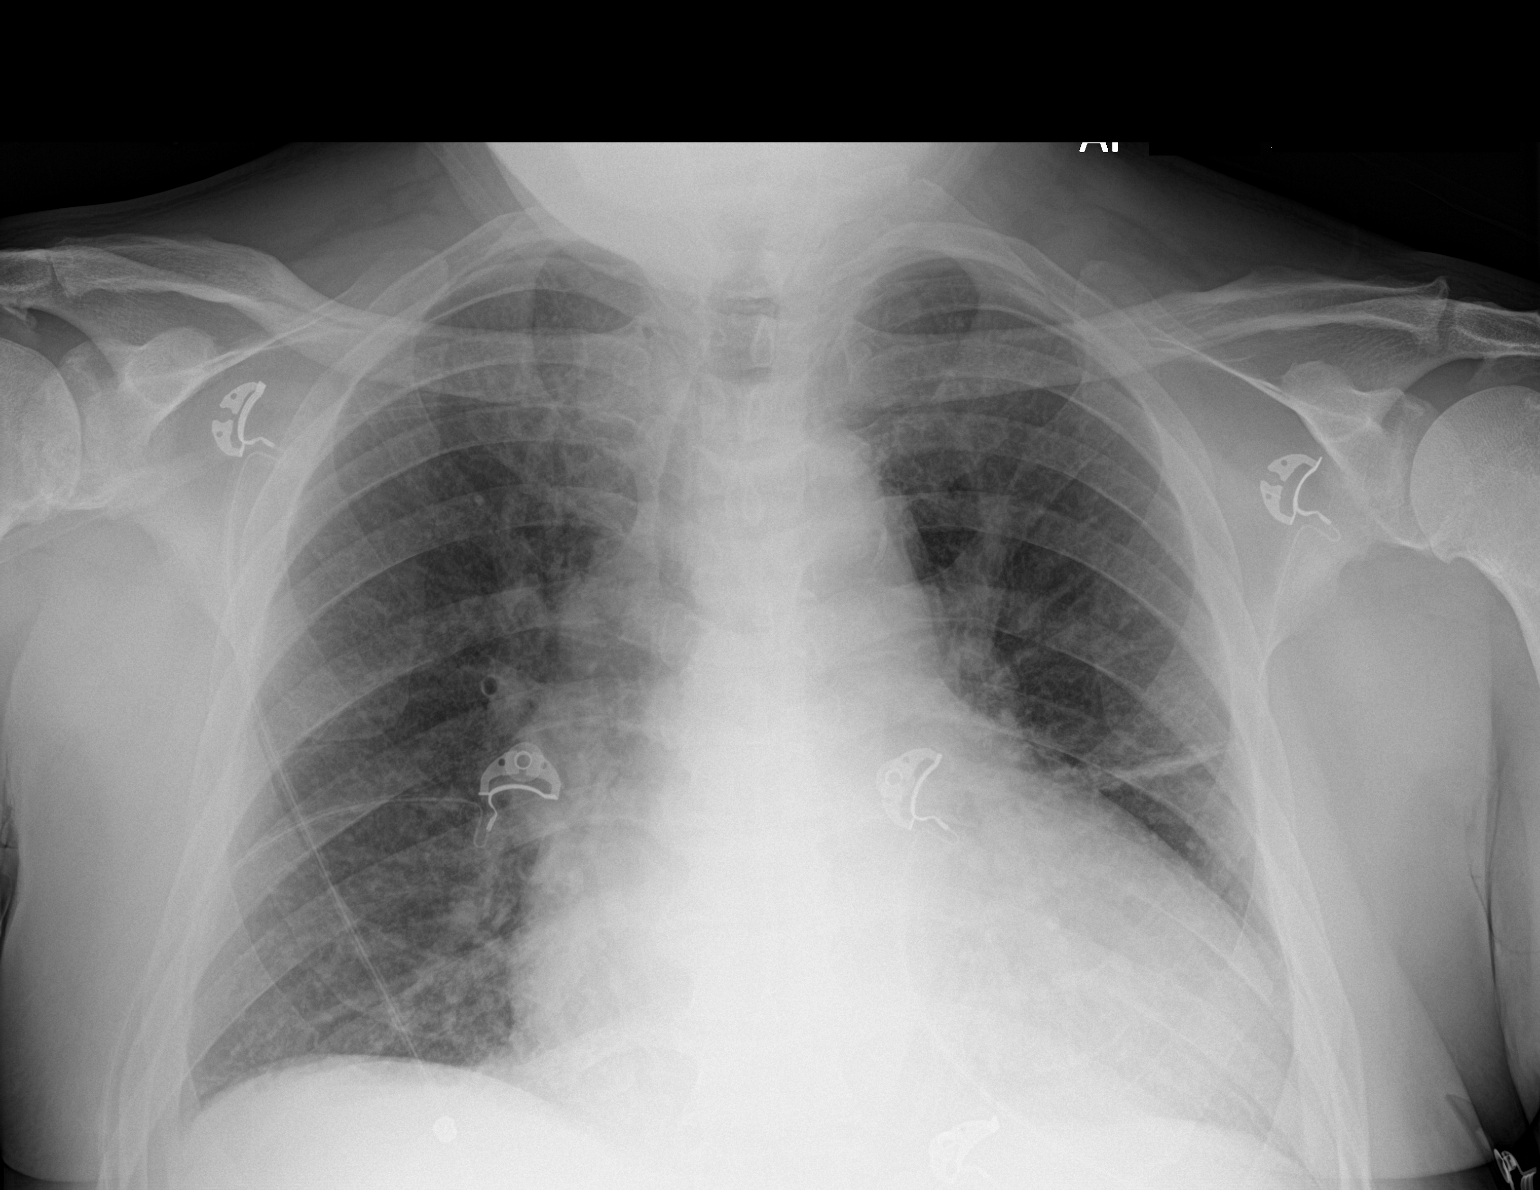

[1 of 1 positions shown; findings below may reference images not displayed]

FINDINGS: Lungs are adequately inflated with linear atelectasis/scarring over
the left midlung. No airspace consolidation or effusion. Stable
cardiomegaly. Remainder of the exam is unchanged.
IMPRESSION: No acute cardiopulmonary disease.

Stable cardiomegaly.

Linear atelectasis/scarring left midlung.

## 2019-07-30 IMAGING — DX PORTABLE CHEST - 1 VIEW
1 series · 1 of 1 positions shown · non-contrast
Comparison: Chest x-ray dated 01/15/2019 and 01/14/2019.

CLINICAL DATA: LEFT knee pain and chest pain.

EXAM:
PORTABLE CHEST 1 VIEW

[chest ap]
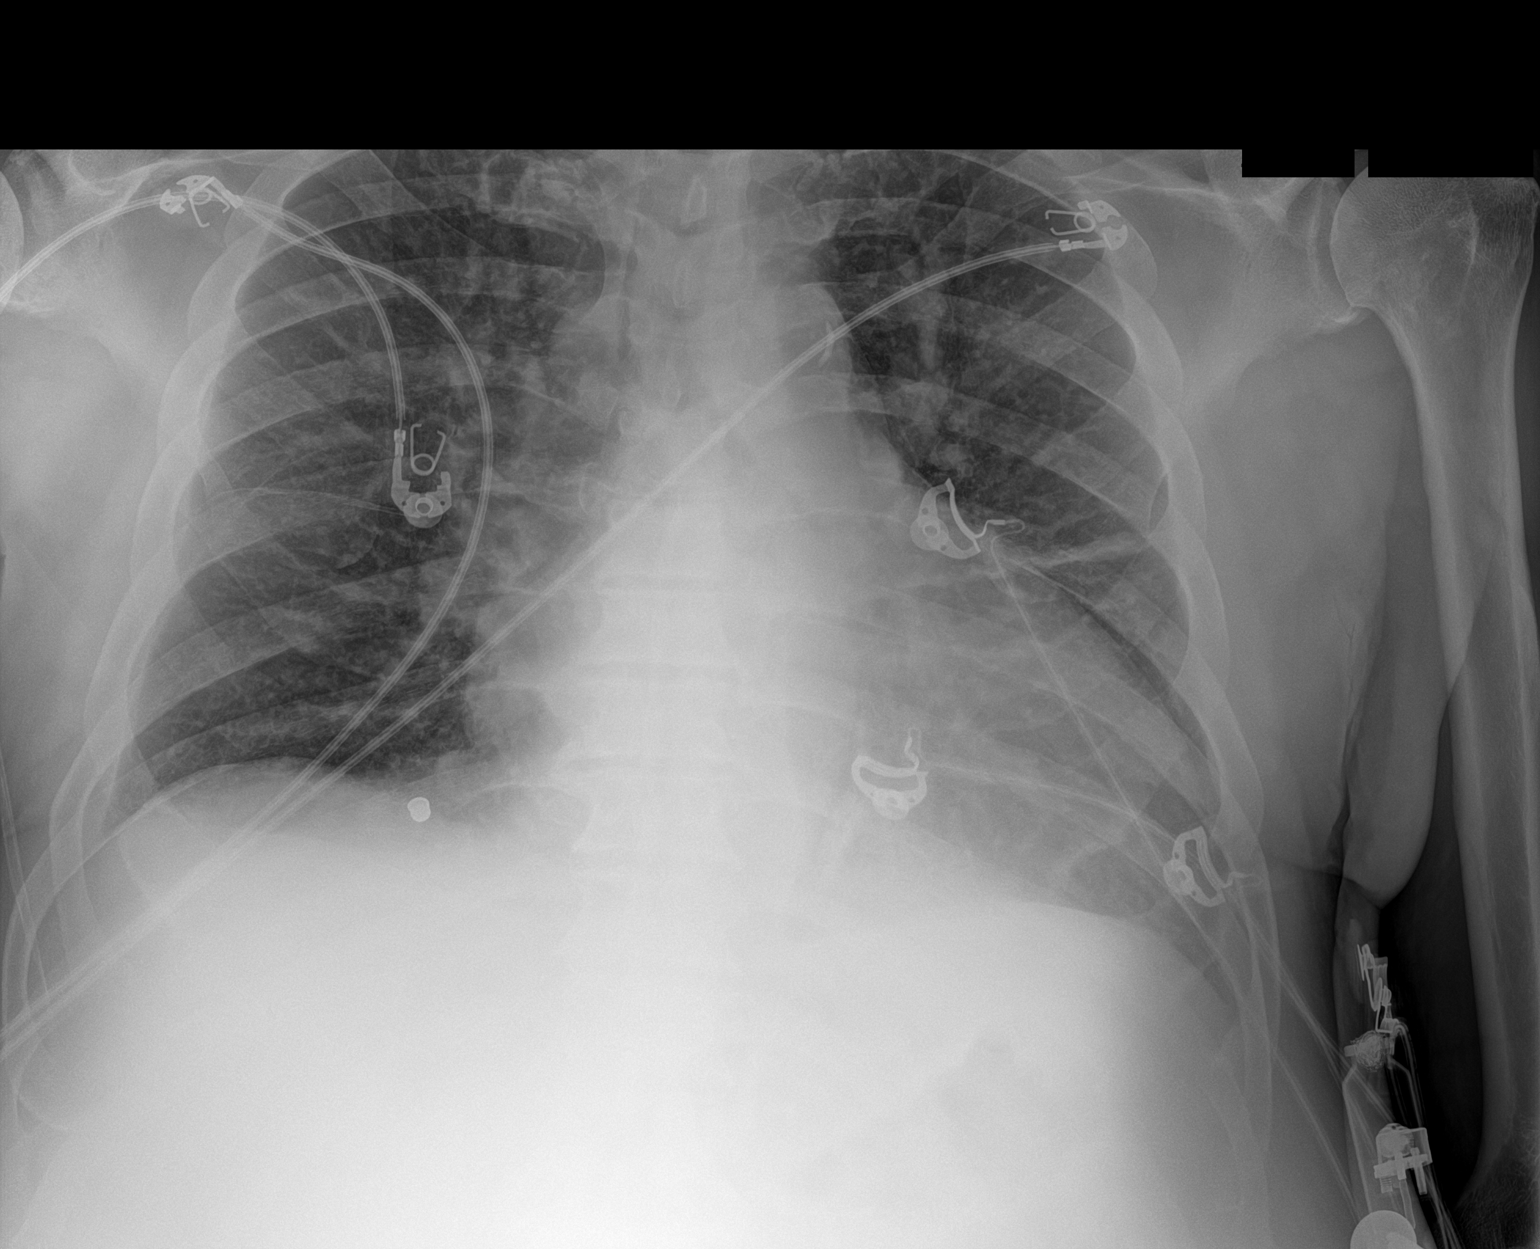

[1 of 1 positions shown; findings below may reference images not displayed]

FINDINGS: Stable cardiomegaly. Mild central pulmonary vascular congestion. No
confluent opacity to suggest a developing pneumonia. No pleural
effusion or pneumothorax seen. Osseous structures about the chest
are unremarkable.
IMPRESSION: Cardiomegaly with mild central pulmonary vascular congestion
suggesting mild CHF/volume overload. No evidence of pneumonia or
overt alveolar pulmonary edema.

## 2019-08-02 IMAGING — DX PORTABLE CHEST - 1 VIEW
1 series · 1 of 1 positions shown · non-contrast
Comparison: Single-view of the chest 01/16/2019, 01/15/2019 and
11/17/2018.

CLINICAL DATA: Chest pain and shortness of breath.

EXAM:
PORTABLE CHEST 1 VIEW

[chest ap]
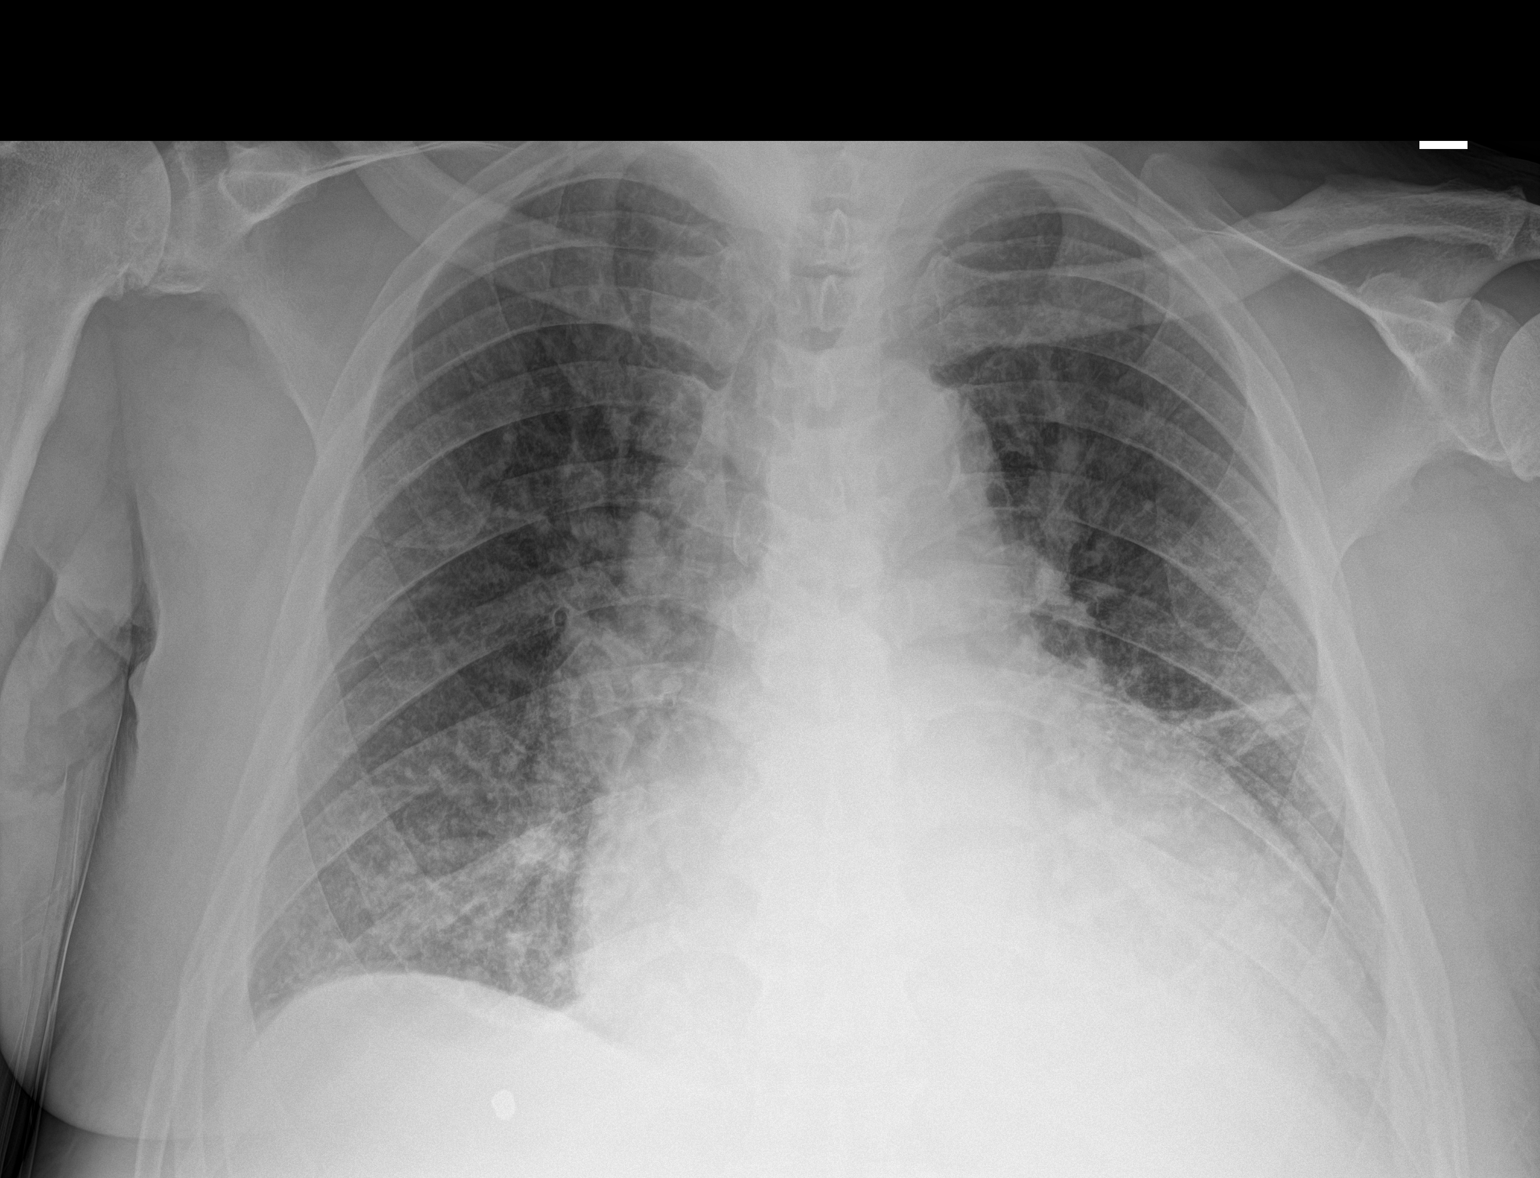

[1 of 1 positions shown; findings below may reference images not displayed]

FINDINGS: There is cardiomegaly. Pulmonary edema is new since the most recent
examination. Small bilateral pleural effusions are present, larger
on the left. Atelectasis in the lingula and left lung base noted. No
pneumothorax. Aortic atherosclerosis. No acute or focal bony
abnormality.
IMPRESSION: Cardiomegaly and pulmonary edema with associated small pleural
effusions, greater on the left.

## 2019-08-05 IMAGING — DX PORTABLE CHEST - 1 VIEW
1 series · 1 of 1 positions shown · non-contrast
Comparison: 01/19/2019

CLINICAL DATA: Shortness of breath and chest pain for 1 year

EXAM:
PORTABLE CHEST 1 VIEW

[chest ap]
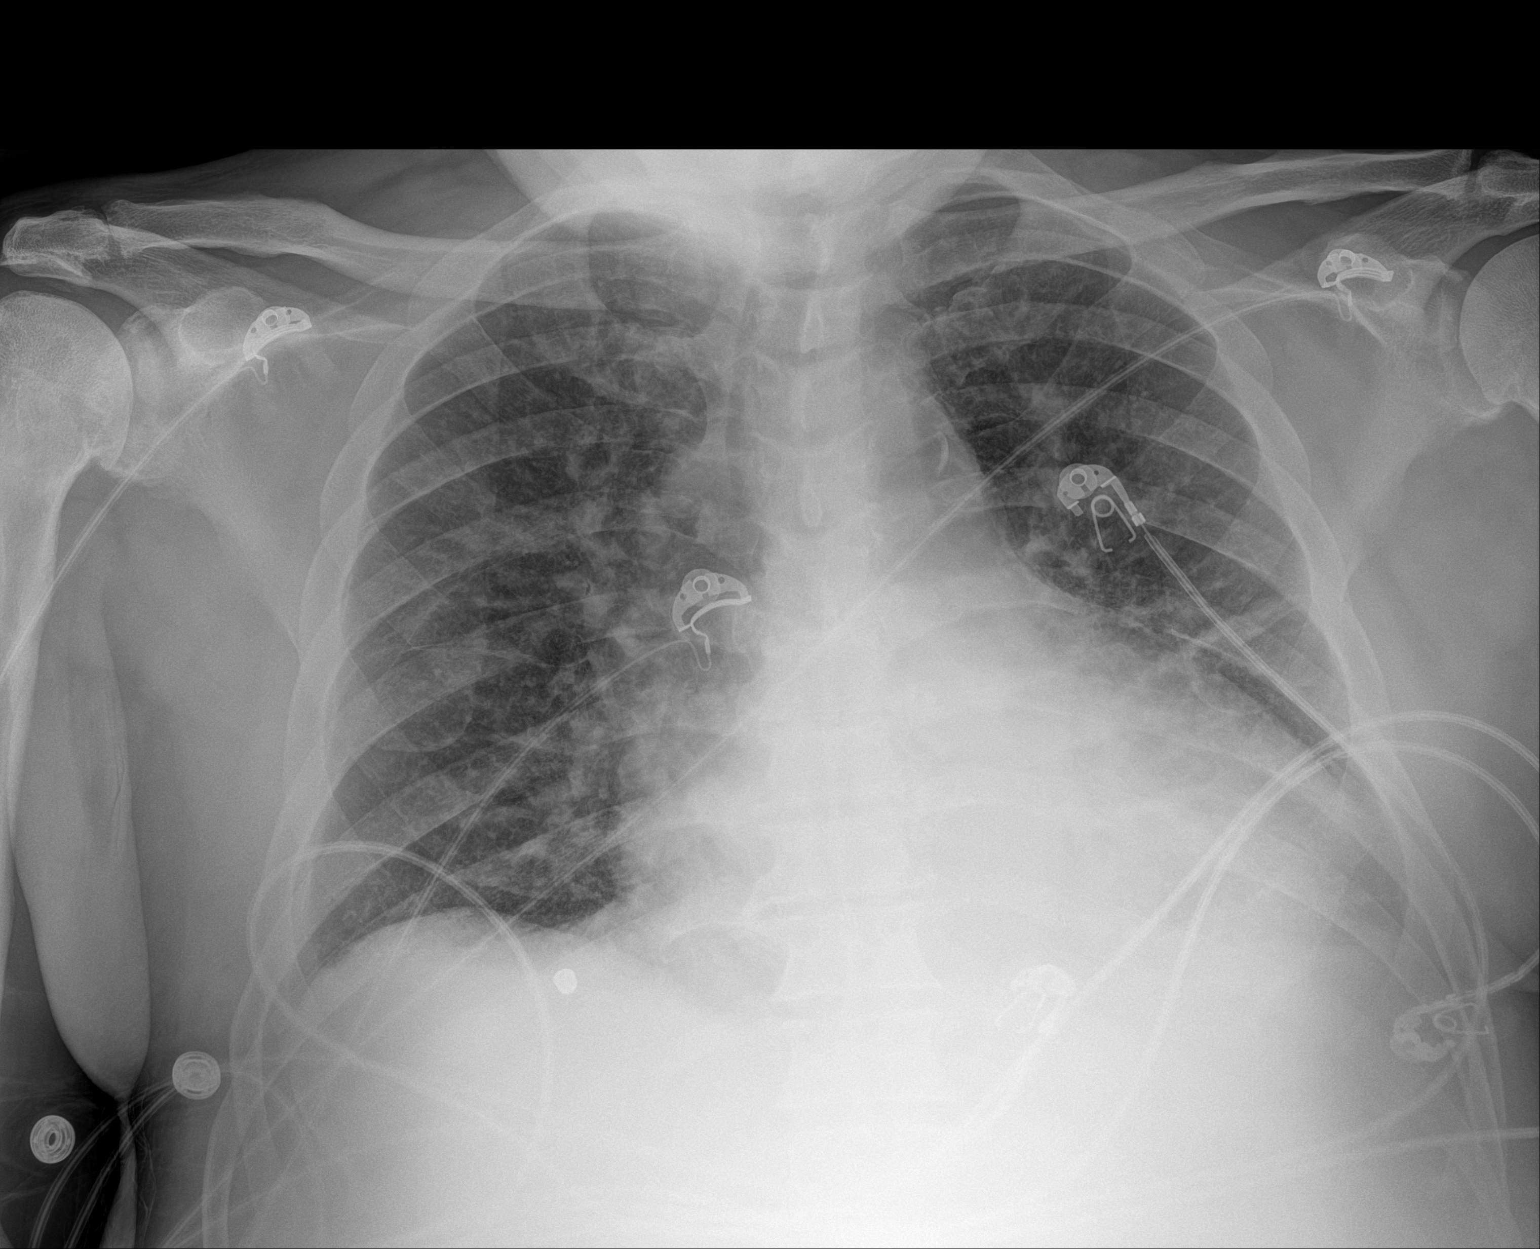

[1 of 1 positions shown; findings below may reference images not displayed]

FINDINGS: Cardiac shadow is enlarged but stable. Aortic calcifications are
again seen. Mild vascular congestion is noted but improved when
compared with the prior exam. Small left pleural effusion is noted
as well as left midlung atelectatic changes.
IMPRESSION: Small left pleural effusion and stable left midlung atelectasis.

Vascular congestion but improved when compared with the prior study.

## 2019-08-08 IMAGING — DX PORTABLE CHEST - 1 VIEW
1 series · 1 of 1 positions shown · non-contrast
Comparison: 01/22/2019, 01/19/2019 and 01/16/2019

CLINICAL DATA: Shortness of breath. Chest pain.

EXAM:
PORTABLE CHEST 1 VIEW

[chest ap]
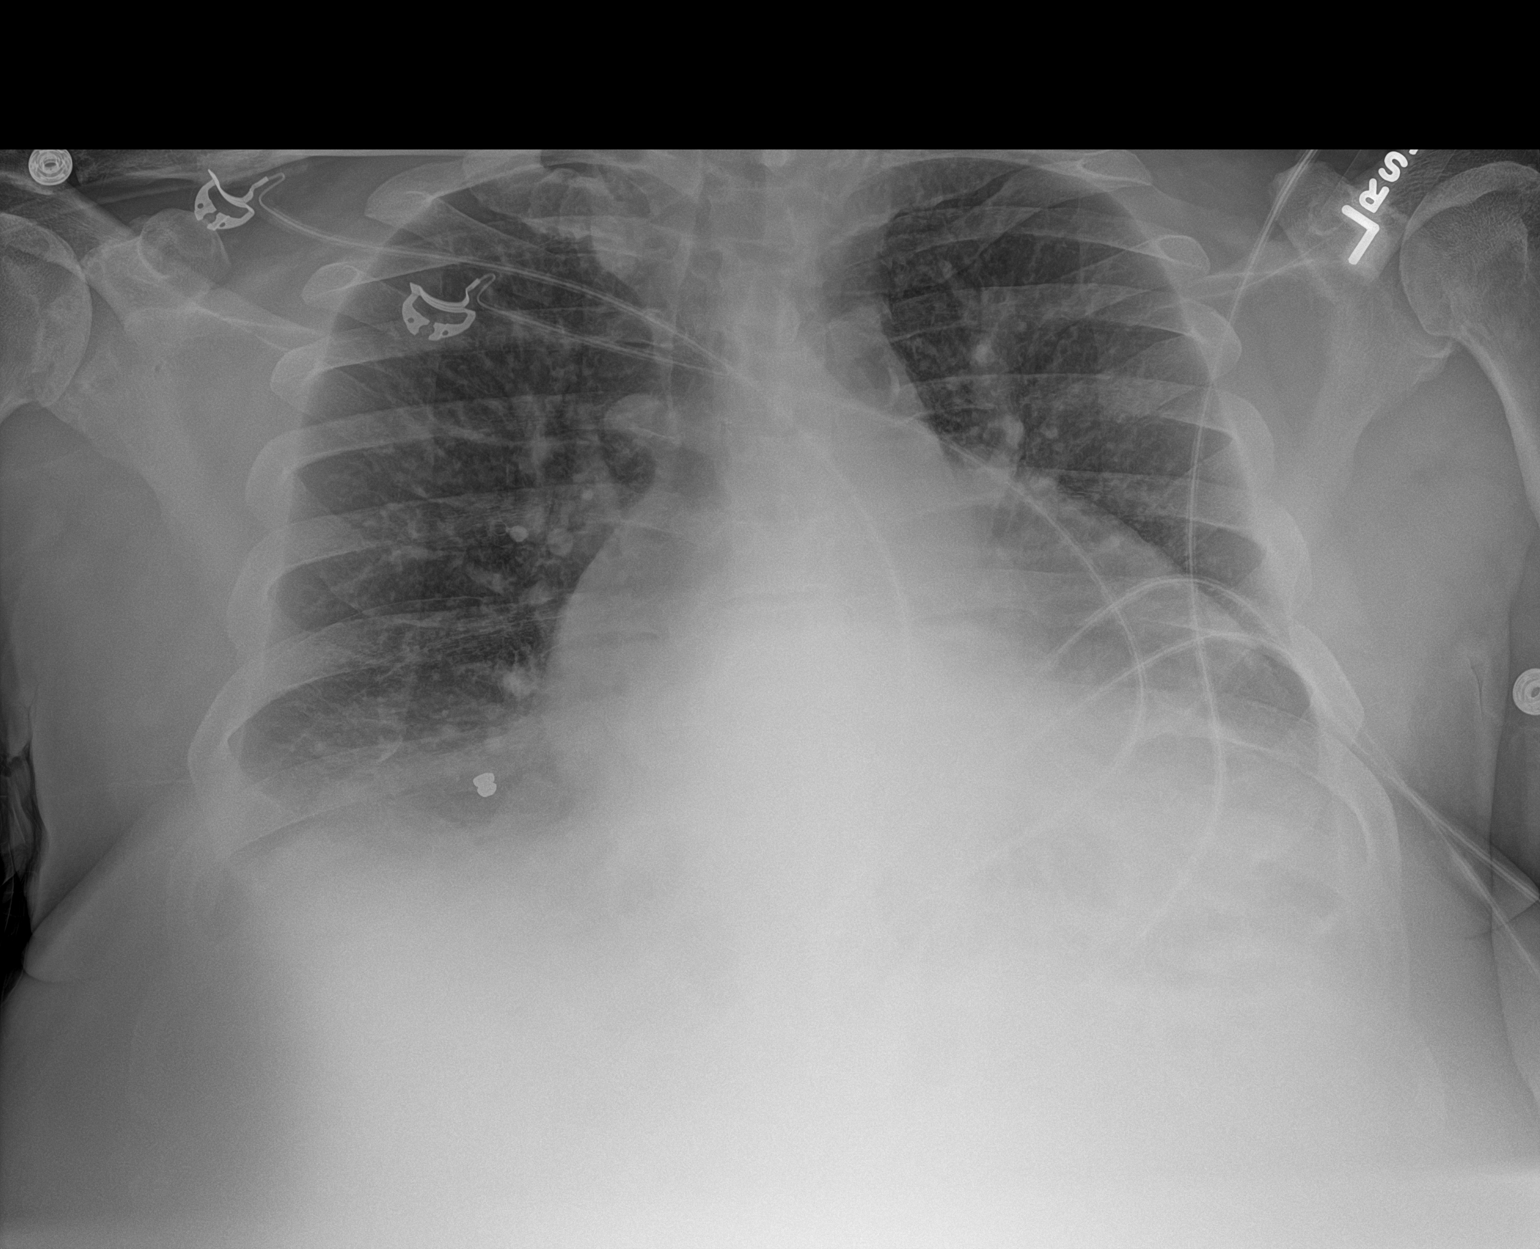

[1 of 1 positions shown; findings below may reference images not displayed]

FINDINGS: Chronic cardiomegaly. Pulmonary vascularity is normal. No
infiltrates. Slight haziness at the lung bases may represent tiny
effusions. No acute bone abnormality.
IMPRESSION: Possible tiny effusions. Chronic cardiomegaly.

## 2019-08-15 IMAGING — CR CHEST - 2 VIEW
2 series · 2 of 2 positions shown · non-contrast
Comparison: 01/30/2019

CLINICAL DATA: Shortness of breath

EXAM:
CHEST - 2 VIEW

[chest lat]
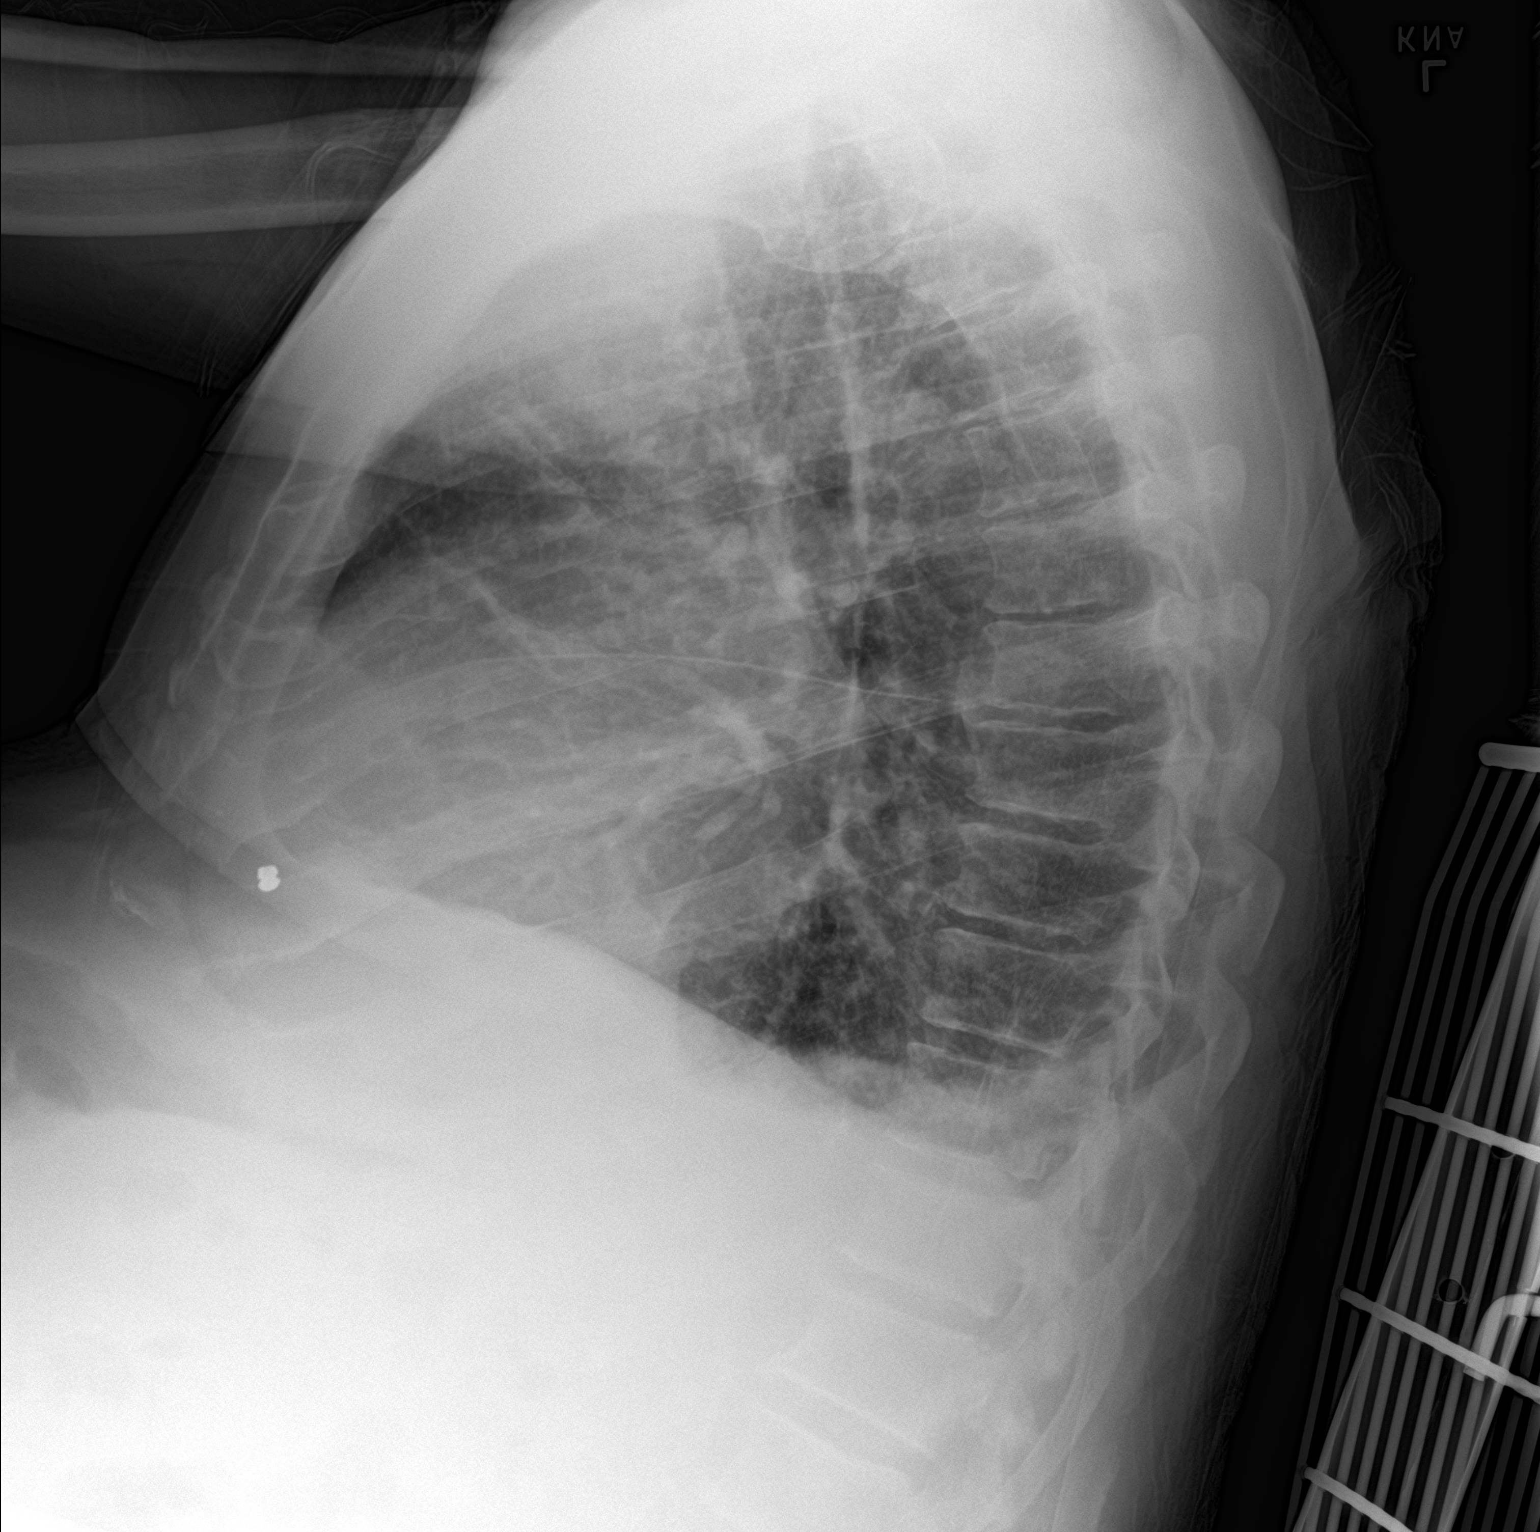

[chest ap]
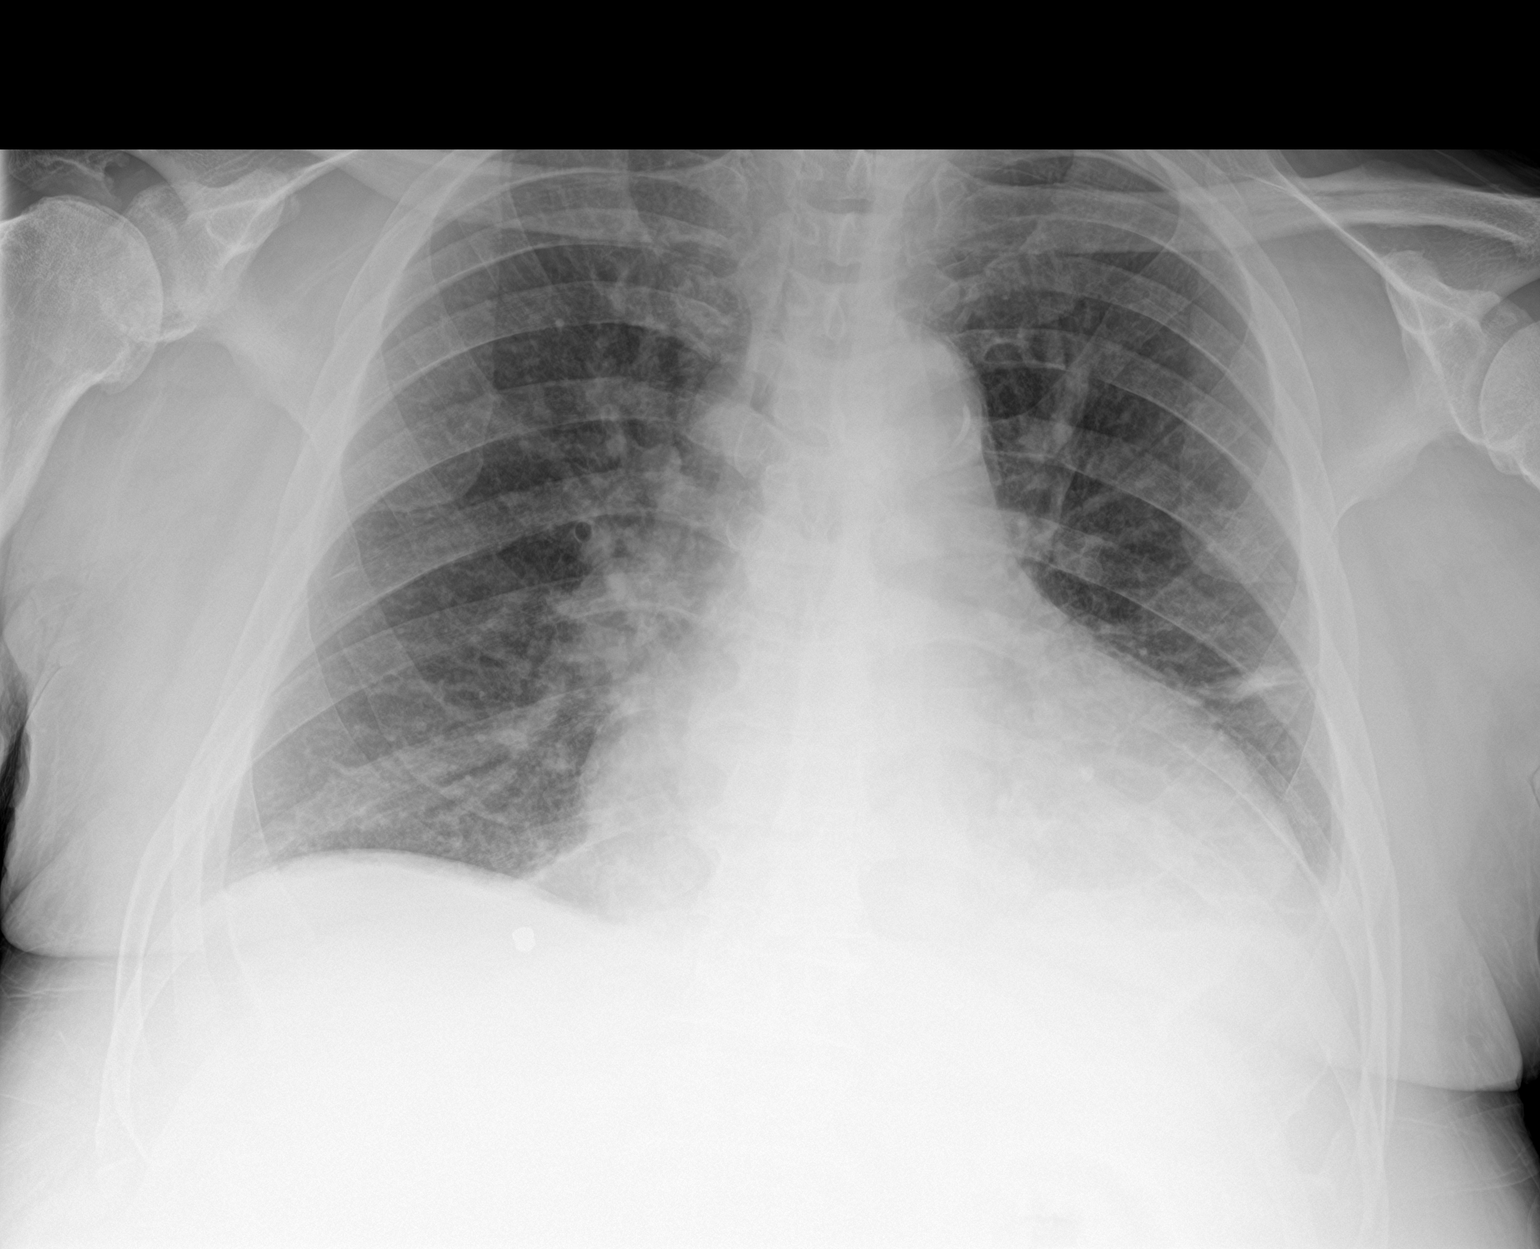

[2 of 2 positions shown; findings below may reference images not displayed]

FINDINGS: Small pleural effusions. Cardiomegaly with vascular congestion and
mild interstitial edema. Patchy atelectasis left base. Aortic
atherosclerosis. No pneumothorax.
IMPRESSION: Cardiomegaly with small pleural effusions, vascular congestion and
mild interstitial edema.

## 2019-08-18 IMAGING — DX PORTABLE CHEST - 1 VIEW
1 series · 1 of 1 positions shown · non-contrast
Comparison: Three days ago

CLINICAL DATA: Shortness of breath

EXAM:
PORTABLE CHEST 1 VIEW

[chest]
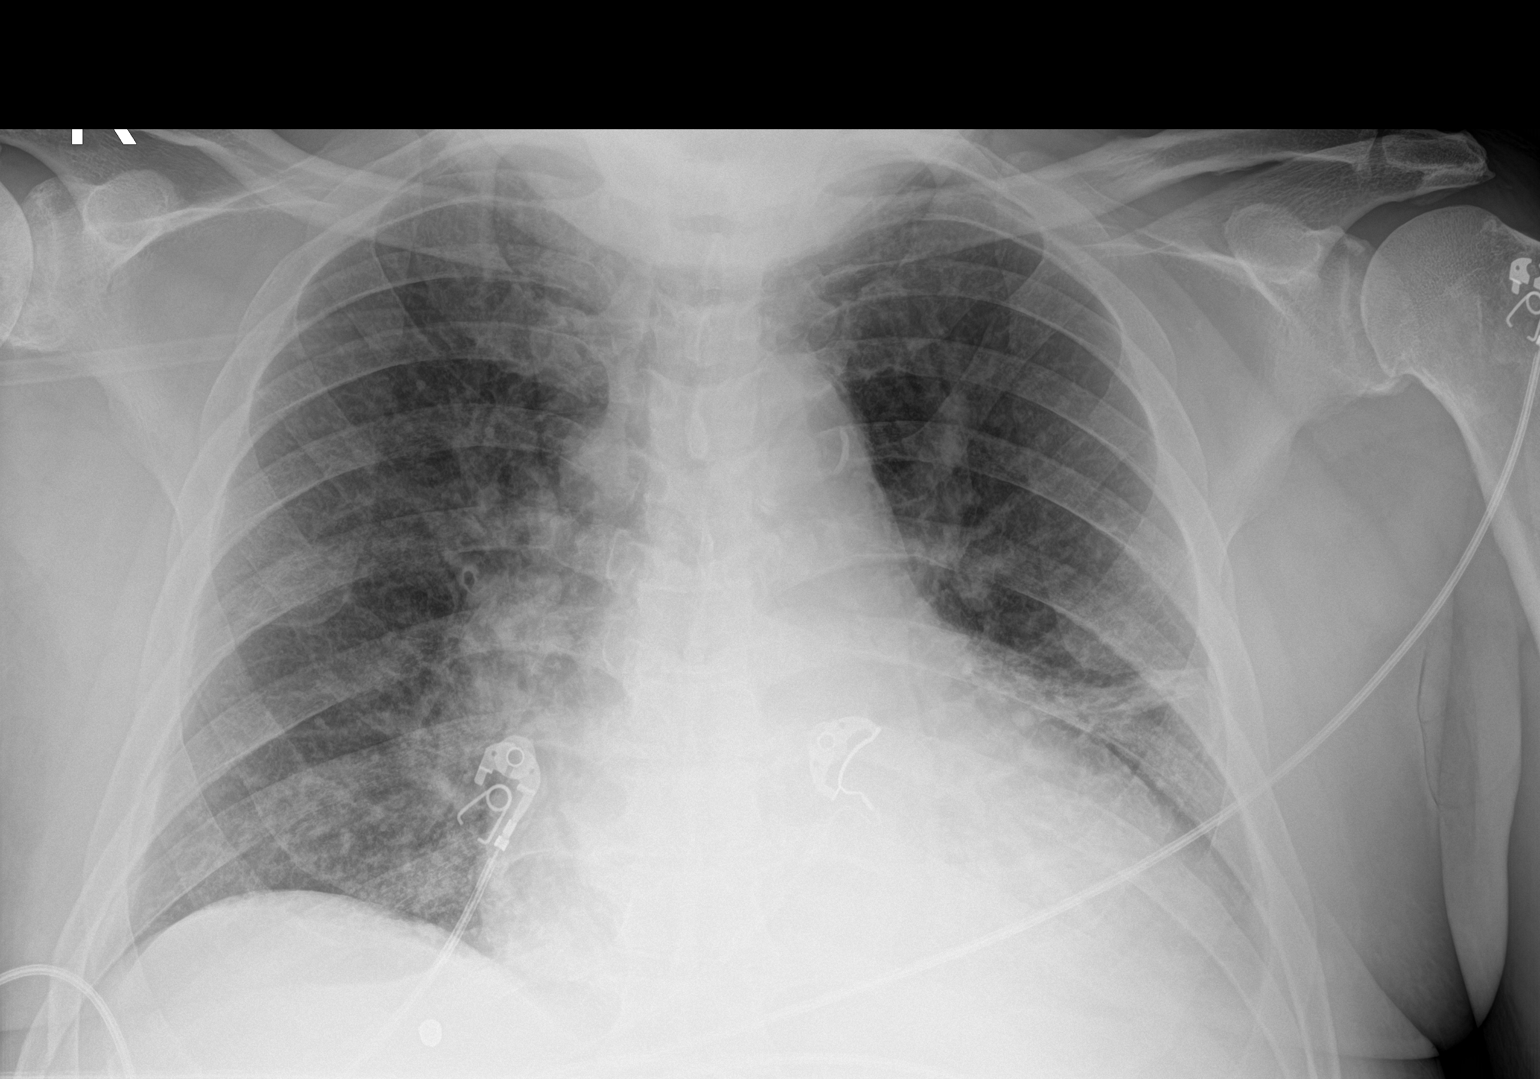

[1 of 1 positions shown; findings below may reference images not displayed]

FINDINGS: Cardiomegaly. Vascular congestion at the hila. Generalized
interstitial coarsening. Band of atelectasis in the left perihilar
lung. Stable blunting of the lateral left costophrenic sulcus.
IMPRESSION: Stable cardiomegaly and vascular congestion with small left
effusion.

## 2019-08-19 IMAGING — DX PORTABLE CHEST - 1 VIEW
1 series · 1 of 1 positions shown · non-contrast
Comparison: February 04, 2019

CLINICAL DATA: Shortness of breath

EXAM:
PORTABLE CHEST 1 VIEW

[chest ap]
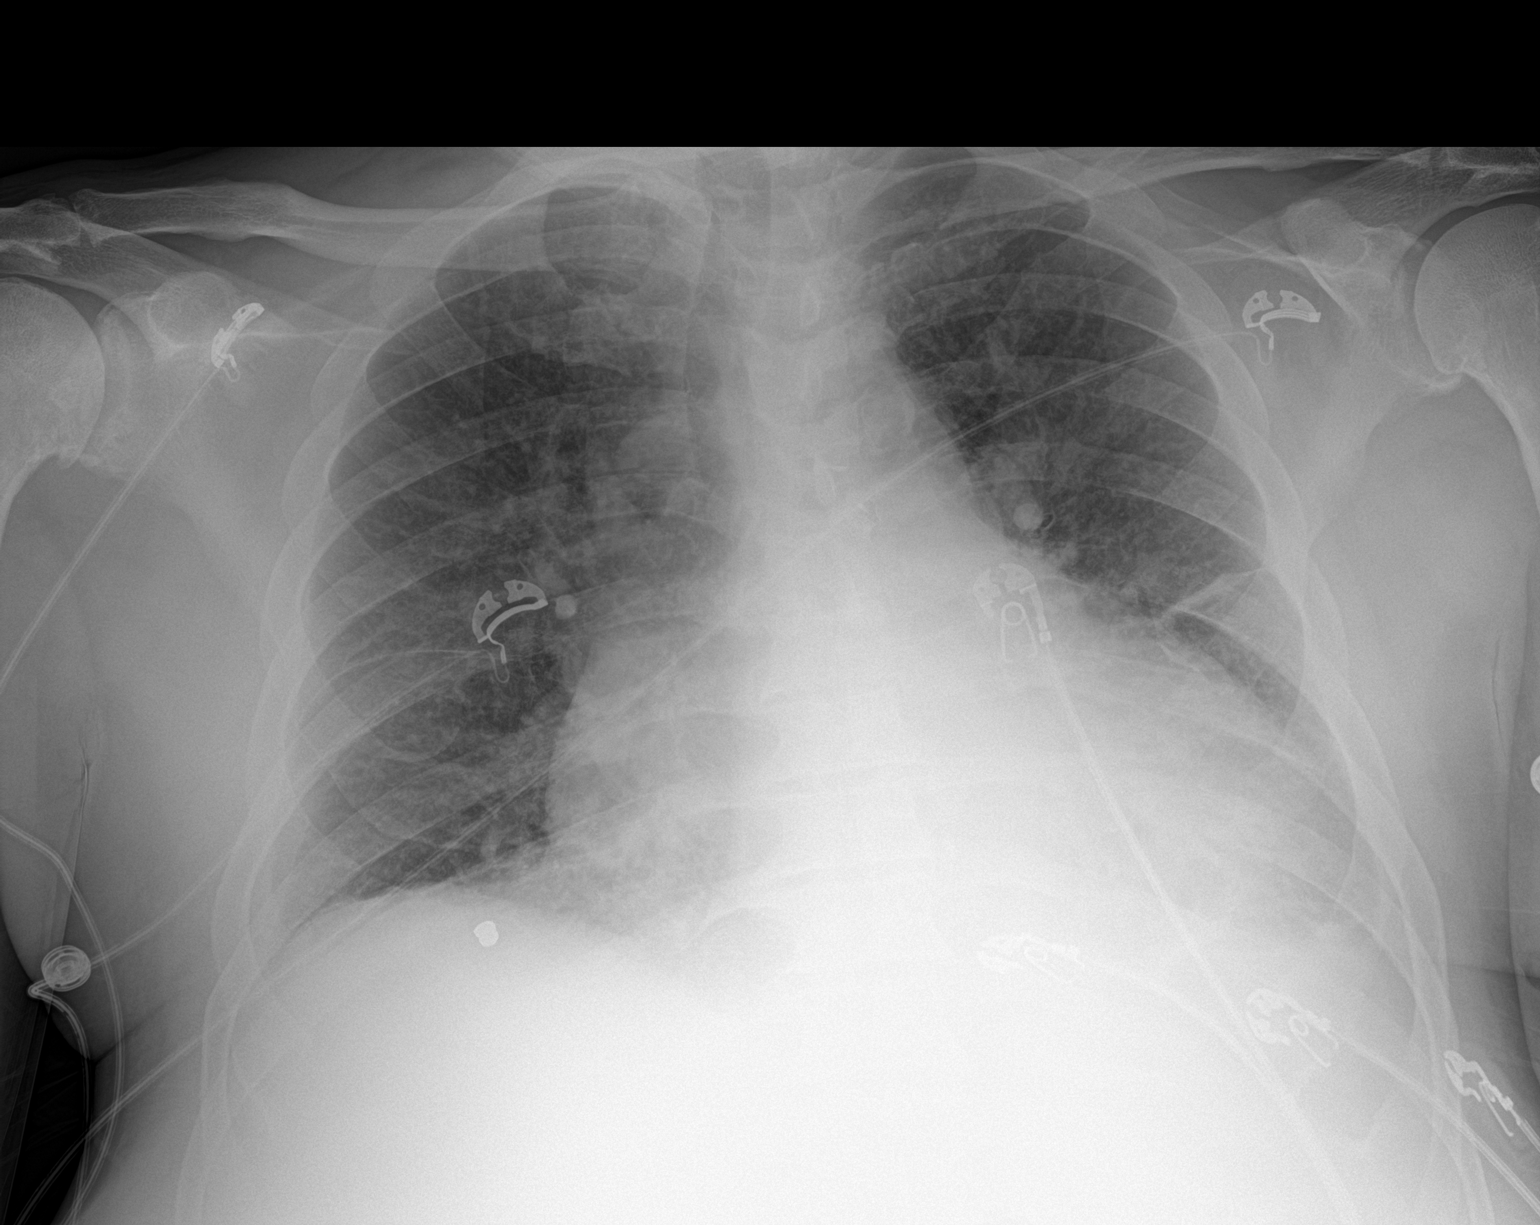

[1 of 1 positions shown; findings below may reference images not displayed]

FINDINGS: There is cardiomegaly with pulmonary venous hypertension. There is
atelectatic change in the left mid lung region. There is no frank
edema or consolidation. There is a small left pleural effusion.
There is aortic atherosclerosis. No adenopathy. No bone lesions.
IMPRESSION: Cardiomegaly with pulmonary vascular congestion. Small left pleural
effusion. Stable atelectasis left mid lung. No frank edema or
consolidation. Aortic Atherosclerosis ([id number]).

## 2019-08-24 IMAGING — DX PORTABLE CHEST - 1 VIEW
1 series · 1 of 1 positions shown · non-contrast
Comparison: 02/05/2019

CLINICAL DATA: Shortness of breath

EXAM:
PORTABLE CHEST 1 VIEW

[chest ap]
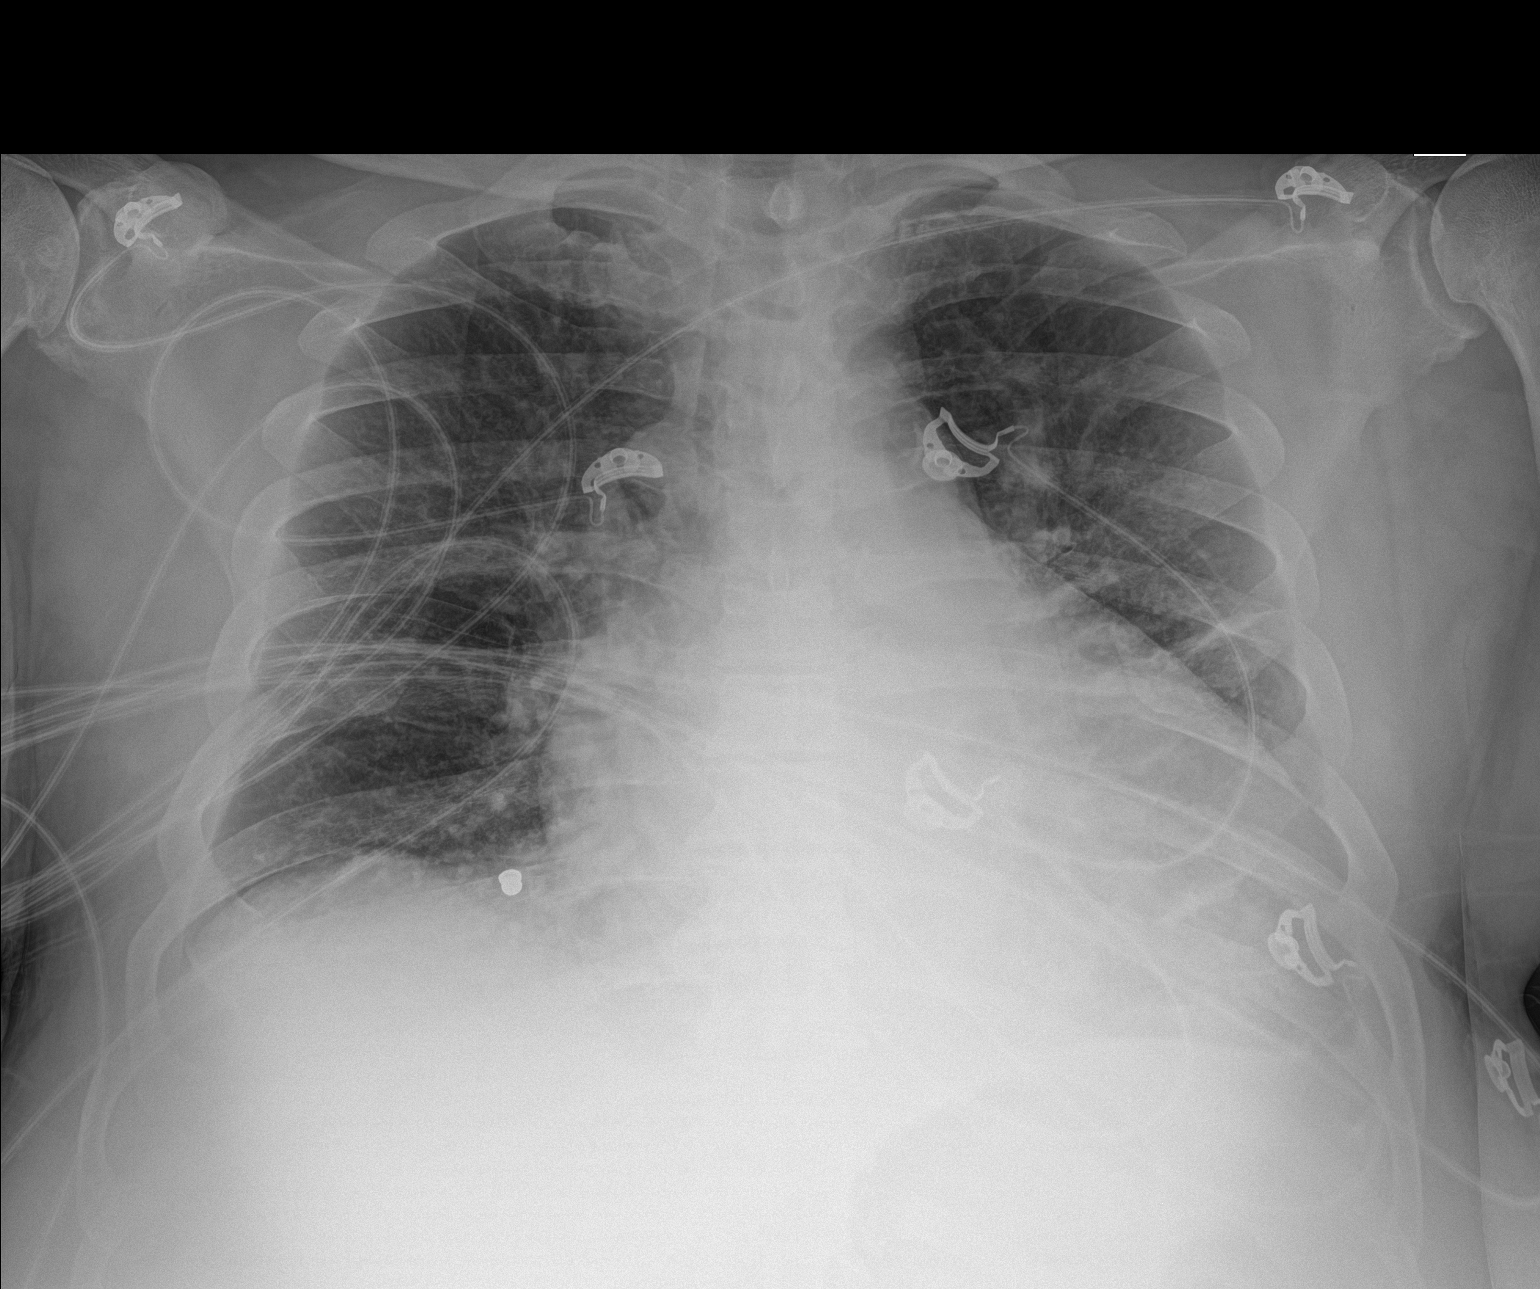

[1 of 1 positions shown; findings below may reference images not displayed]

FINDINGS: Cardiomegaly. No frank interstitial edema. Left lung base is
obscured, possibly reflecting an trace left pleural effusion.
Lingular scarring/atelectasis. No pneumothorax.
IMPRESSION: Cardiomegaly. No frank interstitial edema. Possible trace left
pleural effusion.

## 2019-08-26 IMAGING — DX PORTABLE CHEST - 1 VIEW
1 series · 1 of 1 positions shown · non-contrast
Comparison: February 10, 2019

CLINICAL DATA: Shortness of breath for 6 months.

EXAM:
PORTABLE CHEST 1 VIEW

[chest ap]
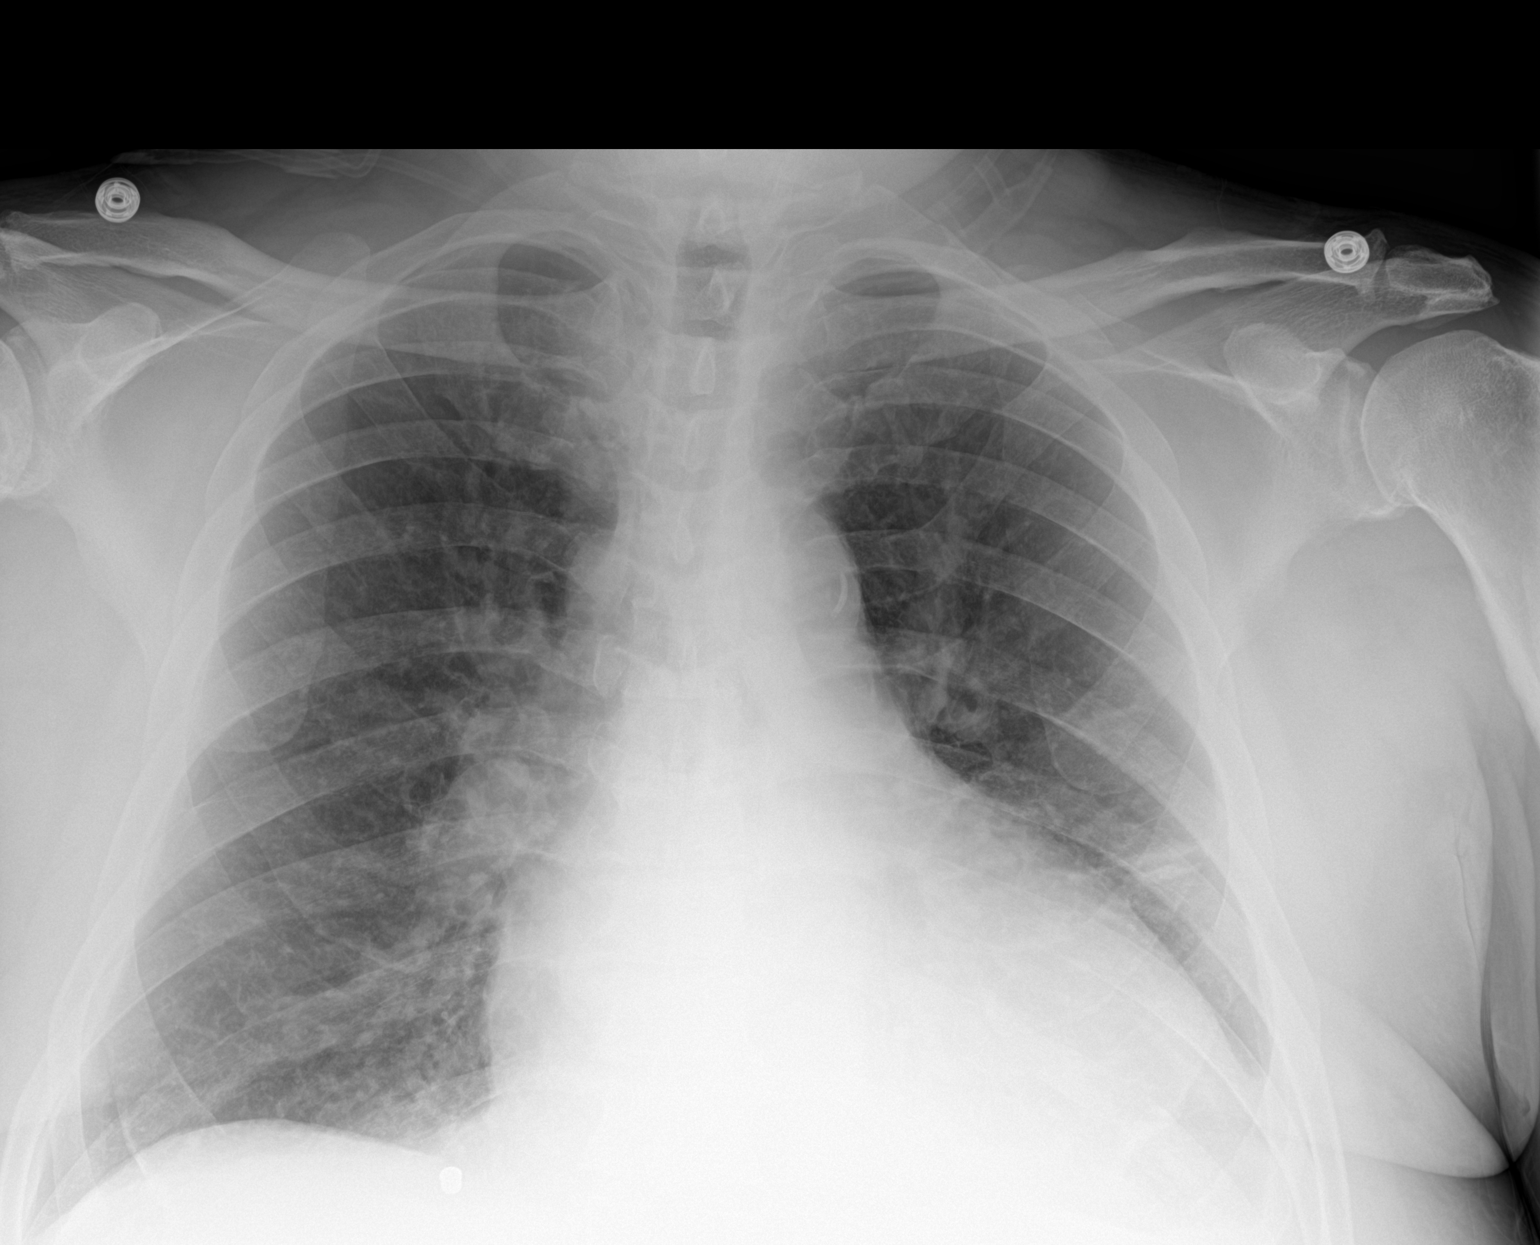

[1 of 1 positions shown; findings below may reference images not displayed]

FINDINGS: Cardiomegaly. The left retrocardiac region is not well assessed.
Scar atelectasis in the lingula. The hila, mediastinum, lungs, and
pleura are otherwise normal.
IMPRESSION: No acute abnormalities.

## 2019-09-02 IMAGING — DX PORTABLE CHEST - 1 VIEW
1 series · 1 of 1 positions shown · non-contrast
Comparison: Multiple prior exams most recently 02/12/2019

CLINICAL DATA: Shortness of breath.

EXAM:
PORTABLE CHEST 1 VIEW

[chest]
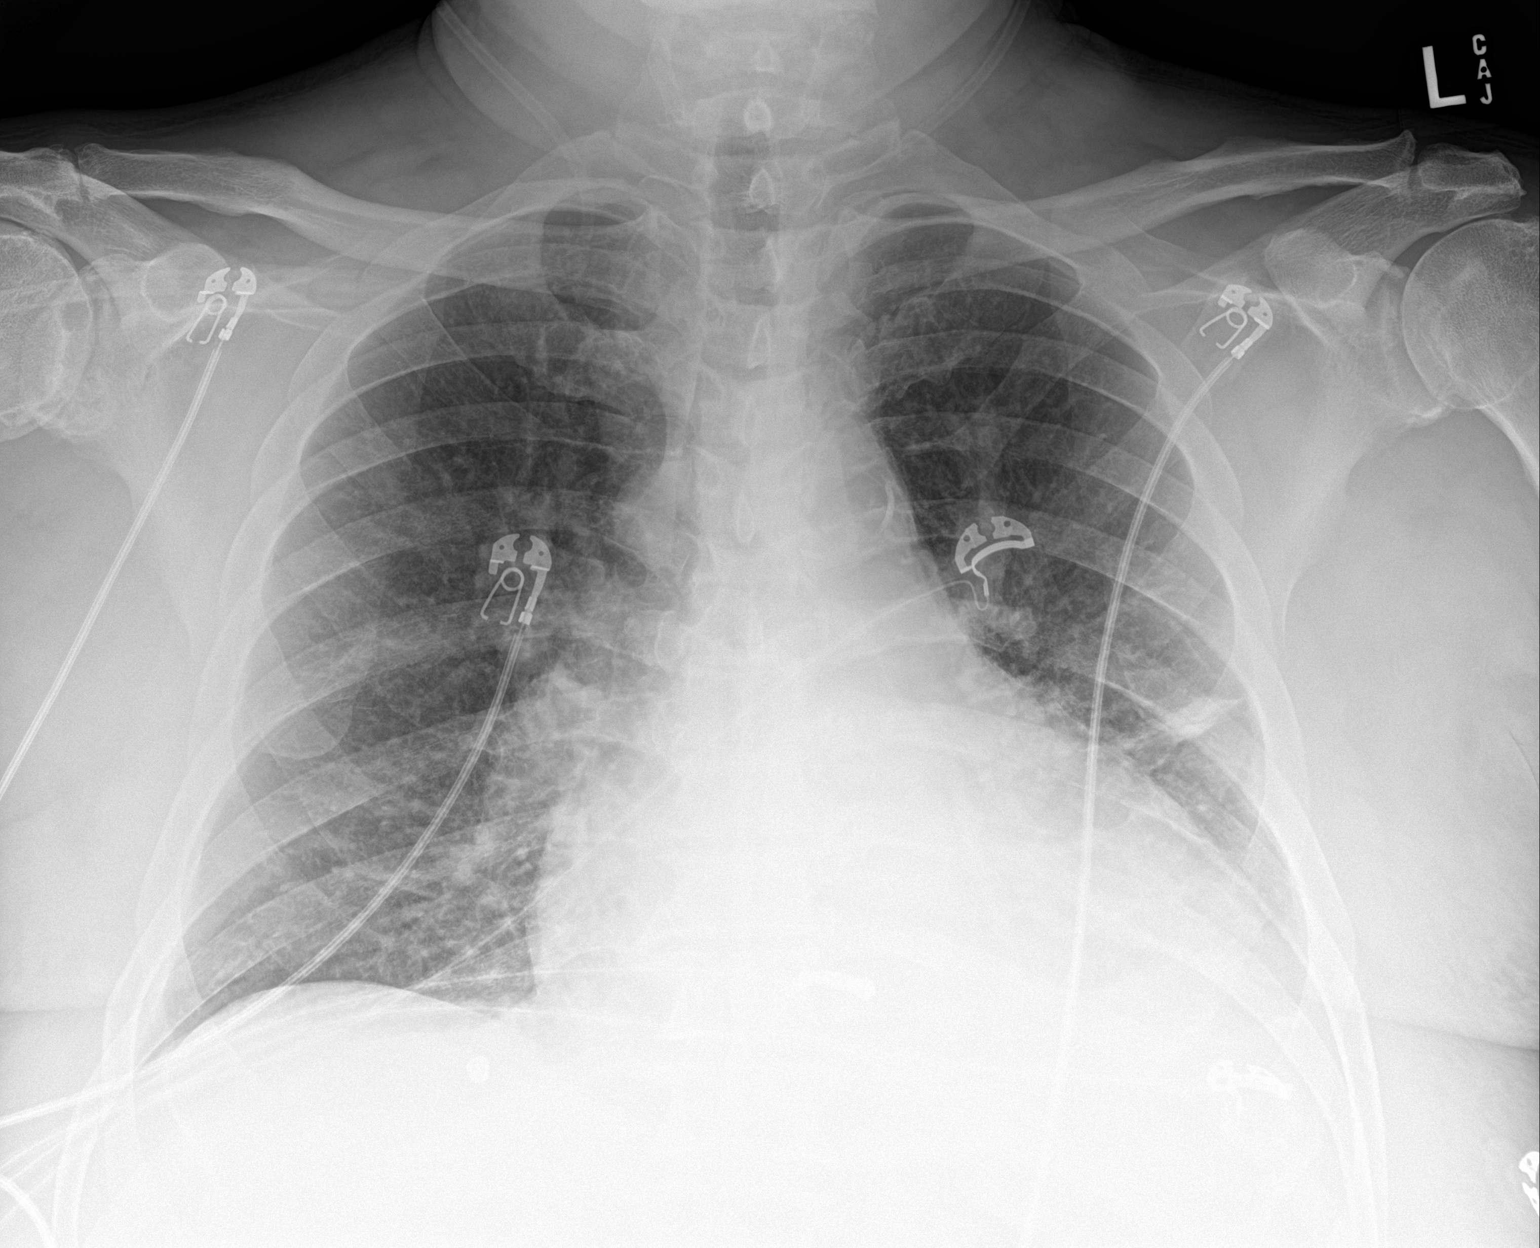

[1 of 1 positions shown; findings below may reference images not displayed]

FINDINGS: Chronic cardiomegaly. Unchanged mediastinal contours. Left midlung
scarring is unchanged. Similar appearance of retrocardiac opacity
prior exams. Vascular congestion without pulmonary edema. No
pneumothorax or acute airspace disease.
IMPRESSION: Unchanged radiographic appearance of the chest with stable
cardiomegaly and left midlung scarring.

## 2019-09-04 IMAGING — DX PORTABLE CHEST - 1 VIEW
1 series · 1 of 1 positions shown · non-contrast
Comparison: 02/19/2019

CLINICAL DATA: Enlarged prostate, groin pain.  Shortness of breath

EXAM:
PORTABLE CHEST 1 VIEW

[chest]
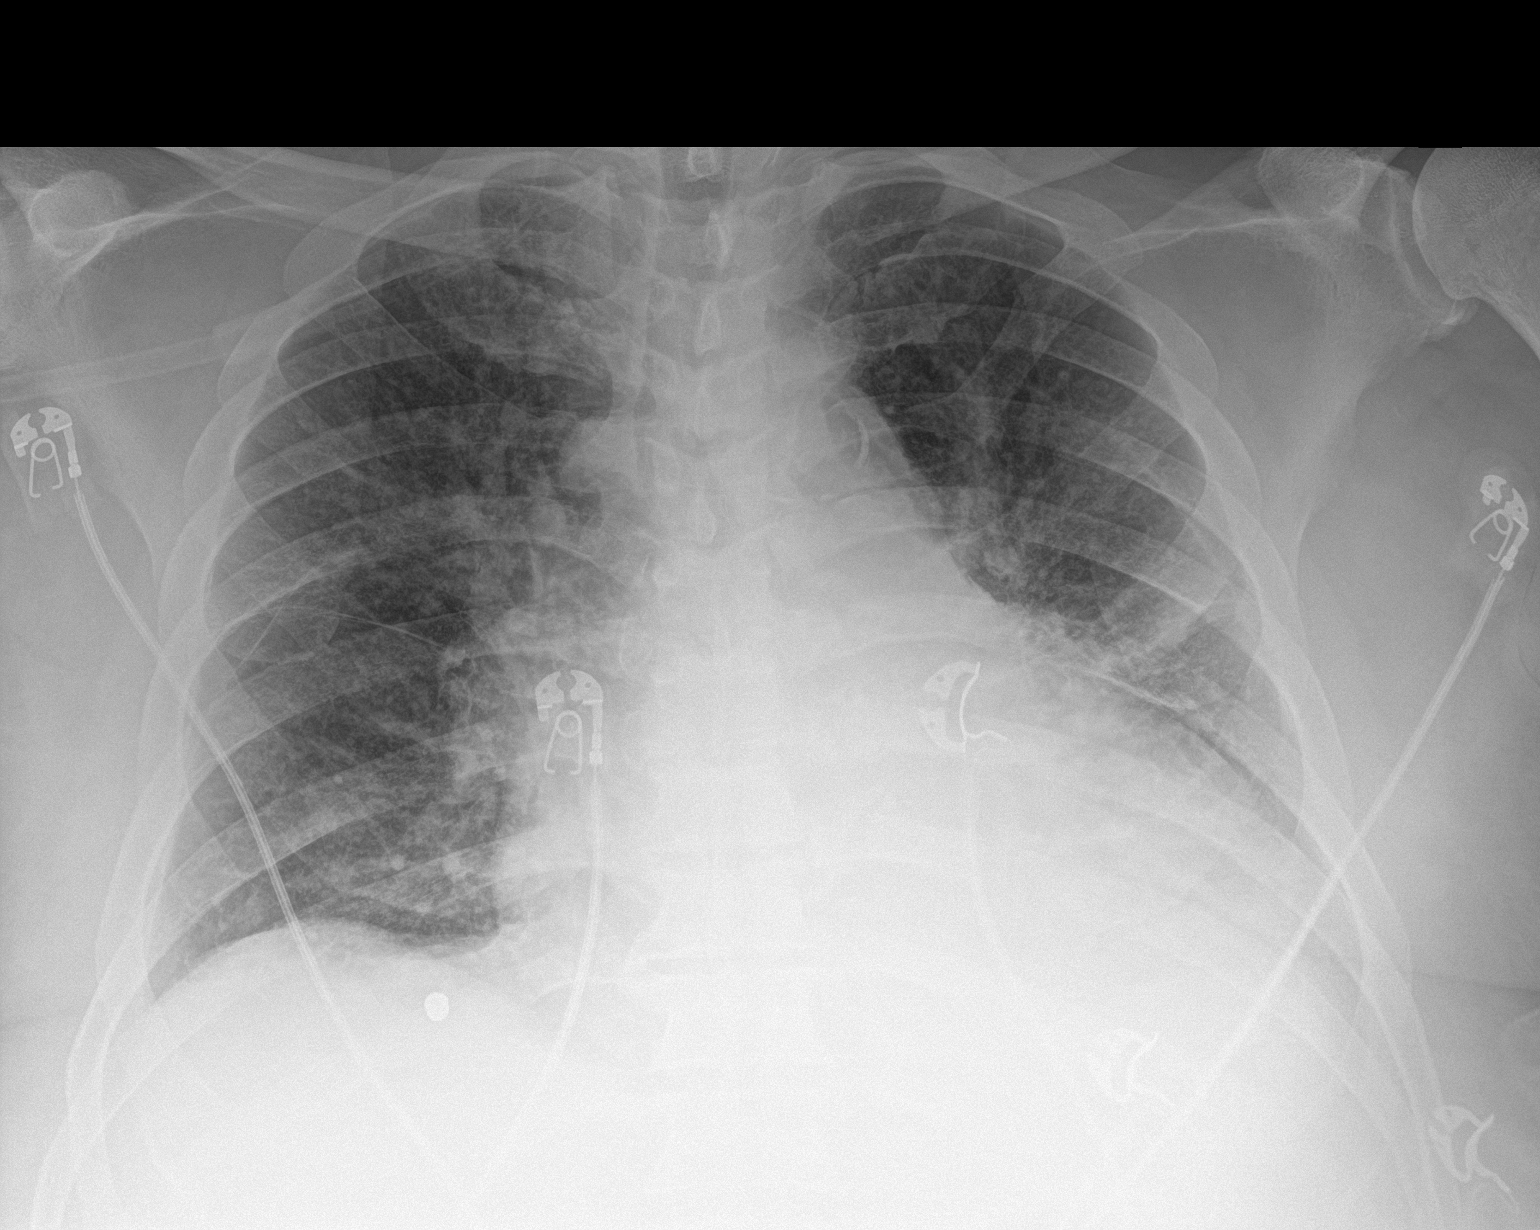

[1 of 1 positions shown; findings below may reference images not displayed]

FINDINGS: Cardiomegaly with vascular congestion. Interstitial prominence is
increased since prior study concerning for interstitial edema.
Lingular atelectasis. No visible effusions or acute bony
abnormality.
IMPRESSION: Cardiomegaly with vascular congestion and mild suspected
interstitial edema.

Lingular atelectasis.

## 2019-09-09 IMAGING — CR PORTABLE CHEST - 1 VIEW
1 series · 1 of 1 positions shown · non-contrast
Comparison: Five days ago

CLINICAL DATA: Shortness of breath

EXAM:
PORTABLE CHEST 1 VIEW

[AP]
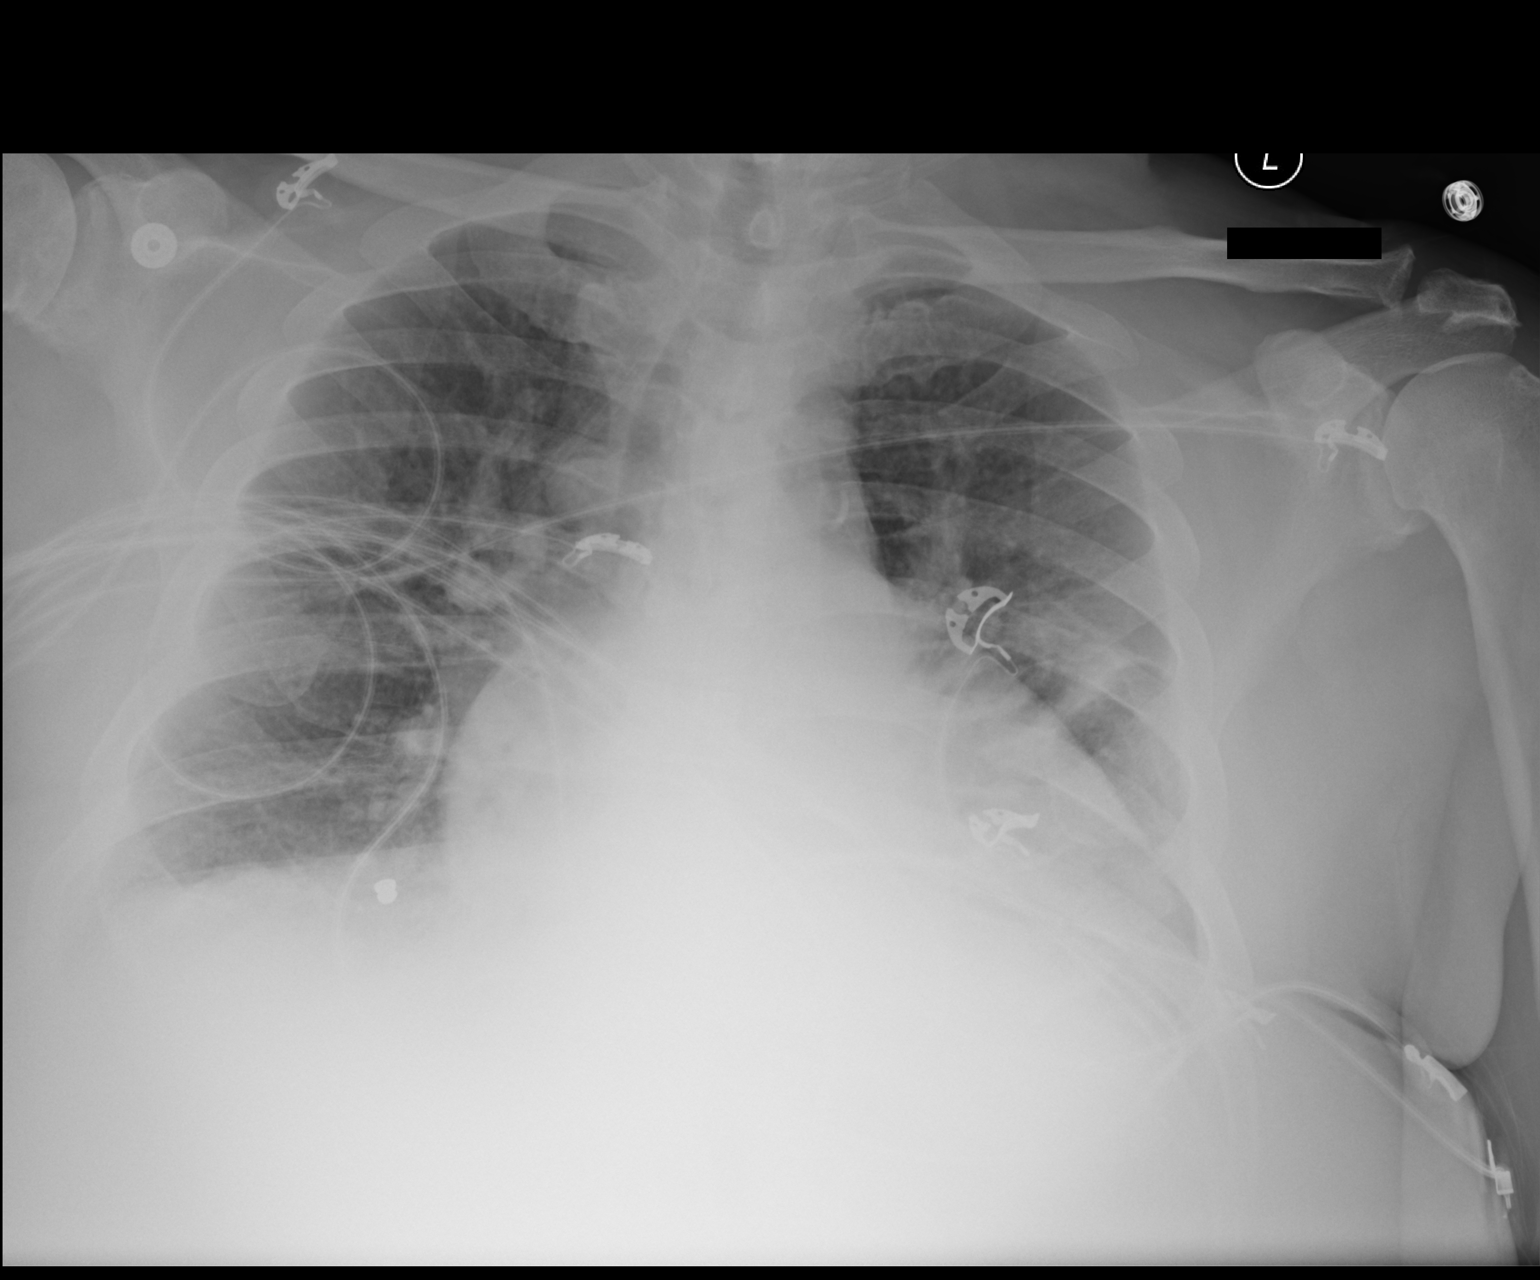

[1 of 1 positions shown; findings below may reference images not displayed]

FINDINGS: Cardiomegaly with vascular congestion. Very low lung volumes.
Streaky left perihilar density. No Kerley lines, effusion, or
pneumothorax.
IMPRESSION: 1. Cardiomegaly and vascular congestion.
2. Low volume chest with band of left perihilar opacity favoring
atelectasis.

## 2019-09-12 IMAGING — DX DG CHEST 1V
1 series · 1 of 1 positions shown · non-contrast
Comparison: 02/26/2019

CLINICAL DATA: Cardiac arrest. Endotracheal and OG tube placement.

EXAM:
CHEST  1 VIEW

[chest ap]
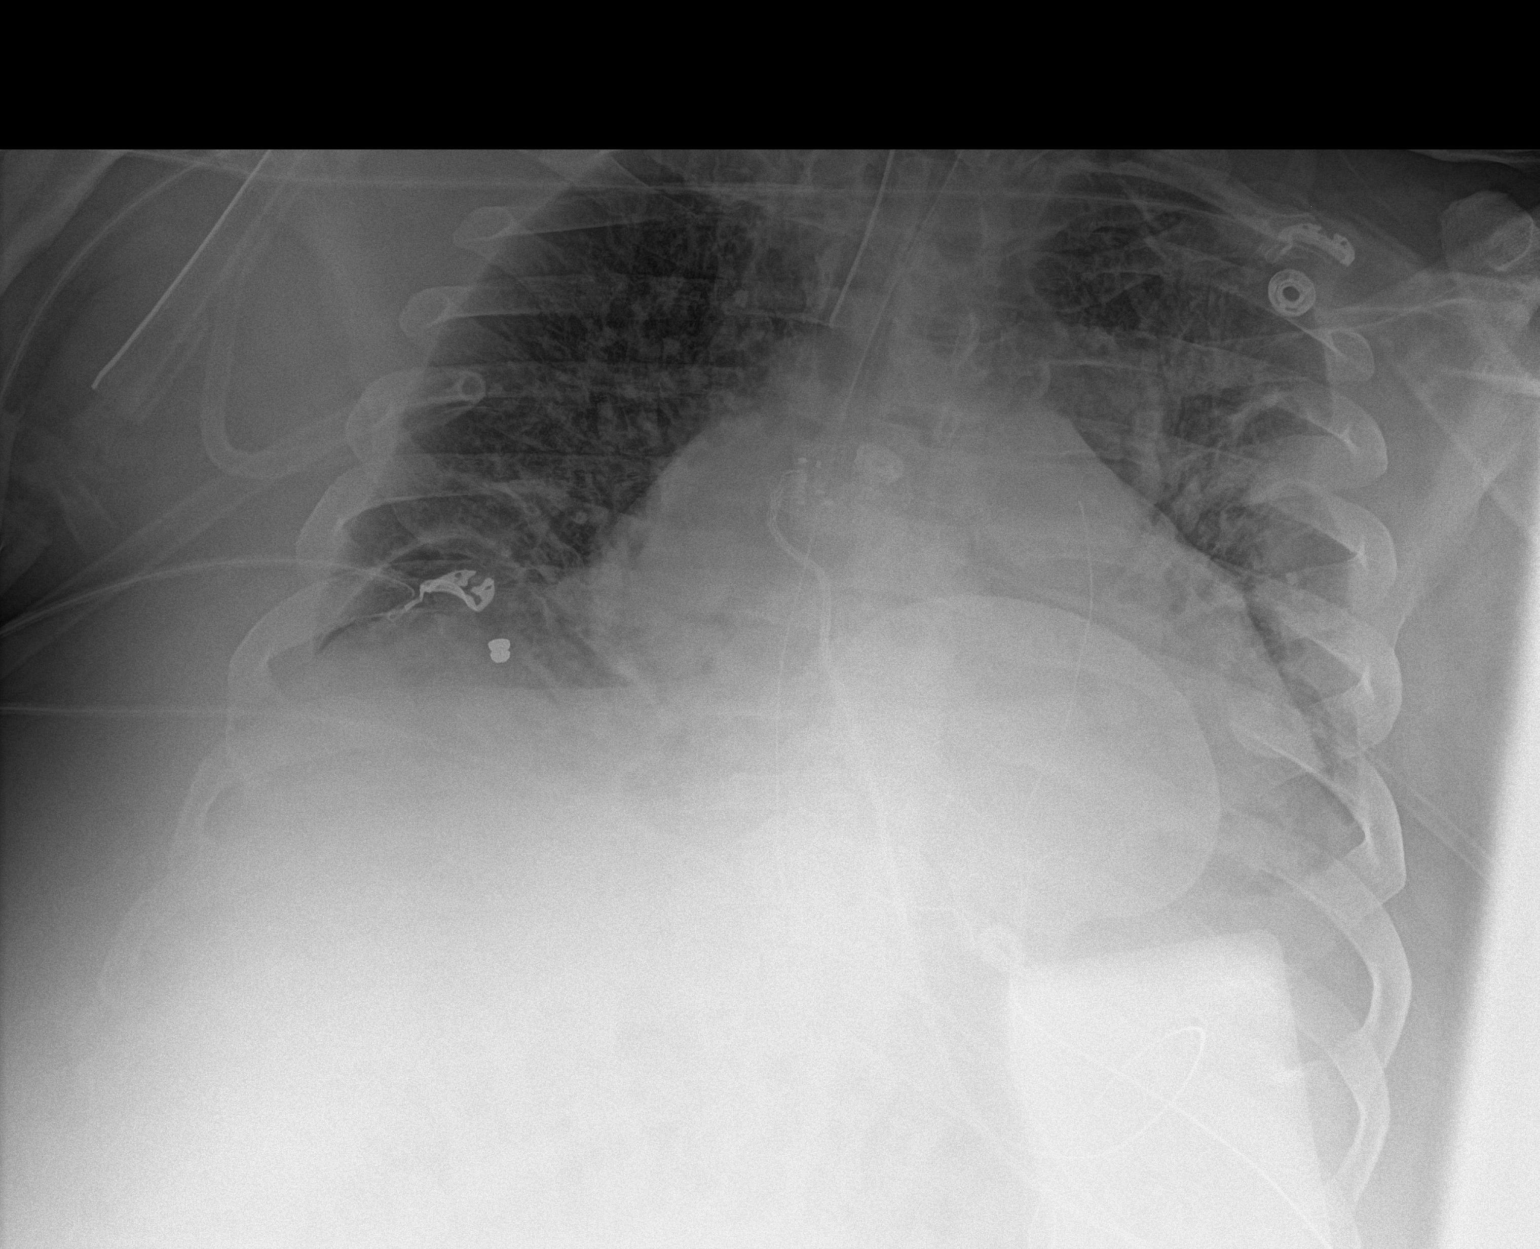

[1 of 1 positions shown; findings below may reference images not displayed]

FINDINGS: Endotracheal tube tip is 13 mm from the carina. Tip and side port of
the enteric tube below the diaphragm in the stomach. Cardiomegaly
appears similar to prior exam. Unchanged mediastinal contours.
Worsening pulmonary edema from prior. Possible bilateral pleural
effusions with hazy opacity at the bases. No visualized
pneumothorax. Multiple overlying monitoring devices in place.
IMPRESSION: 1. Endotracheal tube tip 13 mm from the carina. Tip and side port of
the enteric tube below the diaphragm in the stomach.
2. Worsening pulmonary edema from prior exam. Suspect bilateral
pleural effusions.

## 2019-09-13 IMAGING — DX DG CHEST 1V PORT
1 series · 1 of 1 positions shown · non-contrast
Comparison: Radiograph yesterday.

CLINICAL DATA: Central line placement.

EXAM:
PORTABLE CHEST 1 VIEW

[chest ap]
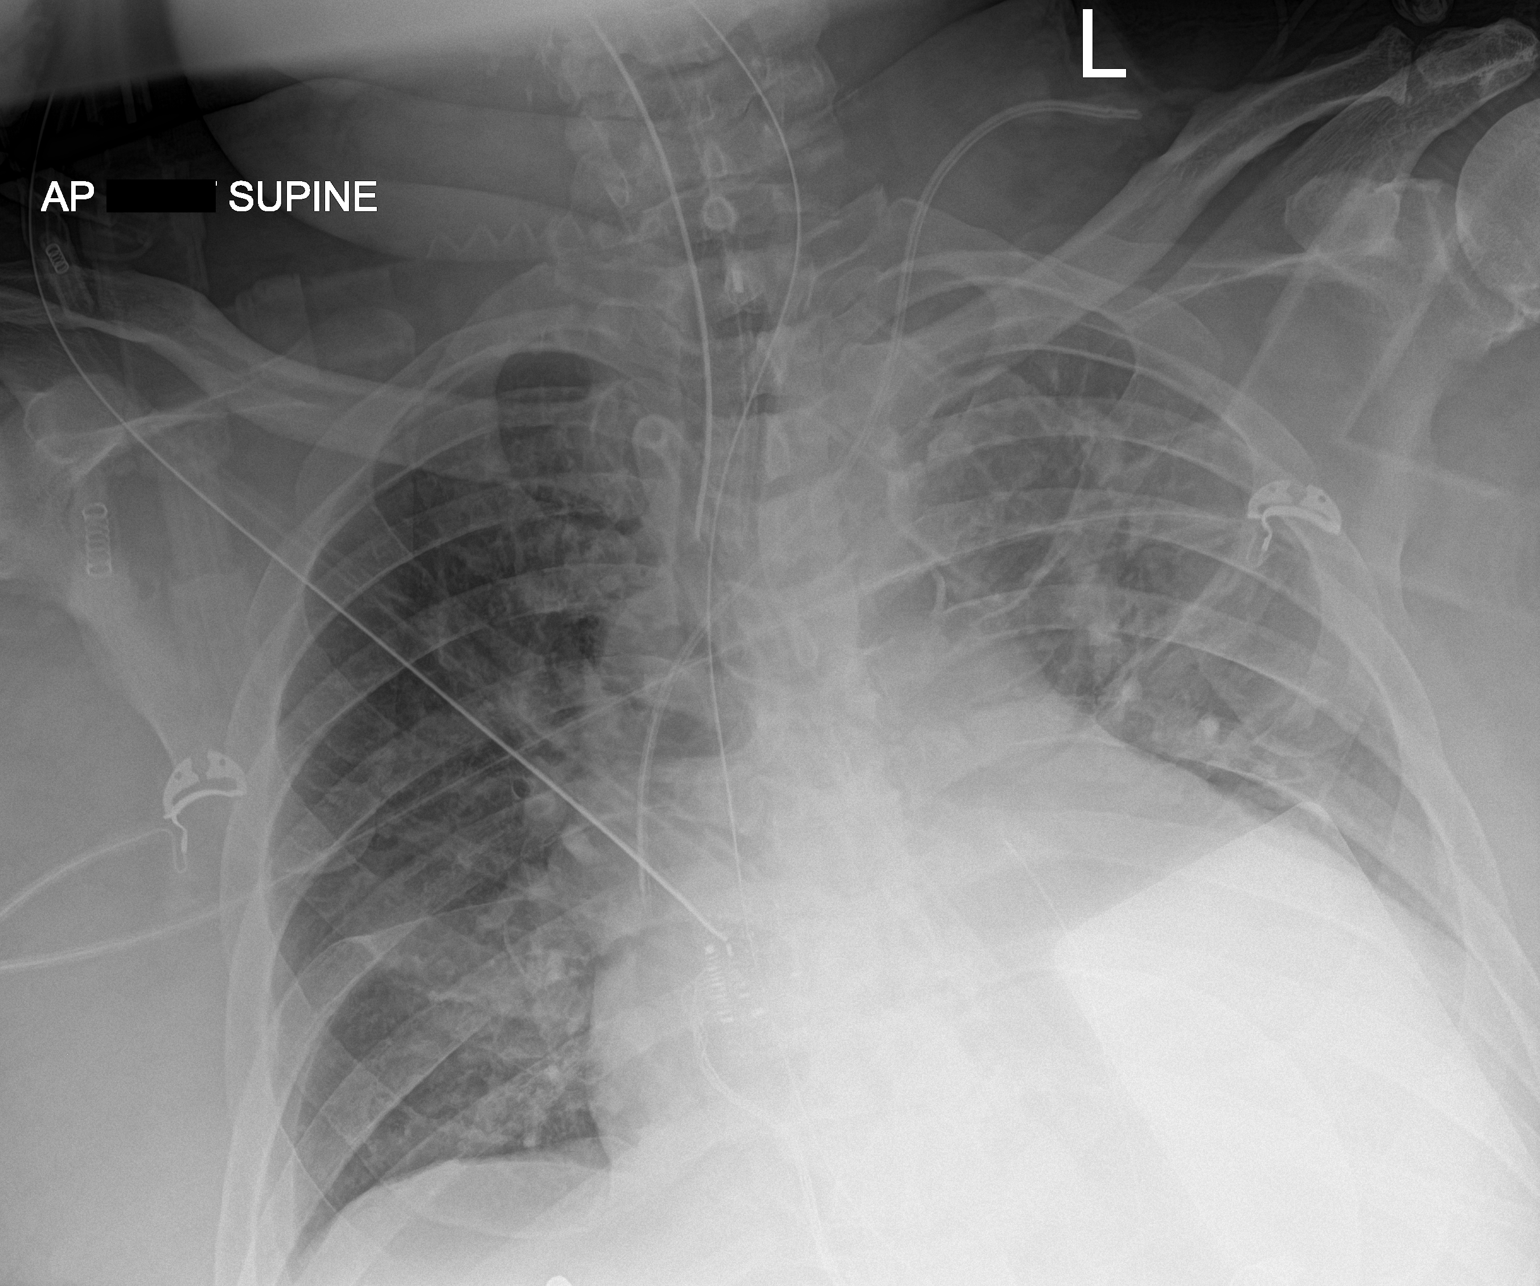

[1 of 1 positions shown; findings below may reference images not displayed]

FINDINGS: Left internal jugular central venous catheter tip in the mid SVC. No
pneumothorax. Endotracheal tube tip approximately 3.6 cm from the
carina. Enteric tube in place with tip below the diaphragm not
included in the field of view. Unchanged cardiomegaly. Limited left
lung base assessment due to soft tissue attenuation in overlying
monitoring devices. Bilateral heterogeneous airspace
opacities/pulmonary edema appear similar.
IMPRESSION: 1. Left internal jugular central venous catheter tip in the mid SVC.
No pneumothorax.
2. Endotracheal tube tip approximately 3.6 cm from the carina.
Enteric tube in place.
3. Unchanged cardiomegaly and bilateral heterogeneous airspace
opacities/pulmonary edema.

## 2019-09-13 IMAGING — CT CT HEAD W/O CM
4 series · 16 of 47 positions shown, 18 images · non-contrast
Comparison: Head CT 09/22/2017

CLINICAL DATA: Encephalopathy. Post code blue and CPR.

EXAM:
CT HEAD WITHOUT CONTRAST
TECHNIQUE: Contiguous axial images were obtained from the base of the skull
through the vertex without intravenous contrast.

[Series 3: head wo · axial · 0.47mm/px · z∈[+1286,+1406]mm · 7 of 34 slices shown, 9 images]
[im 5/34  brain]
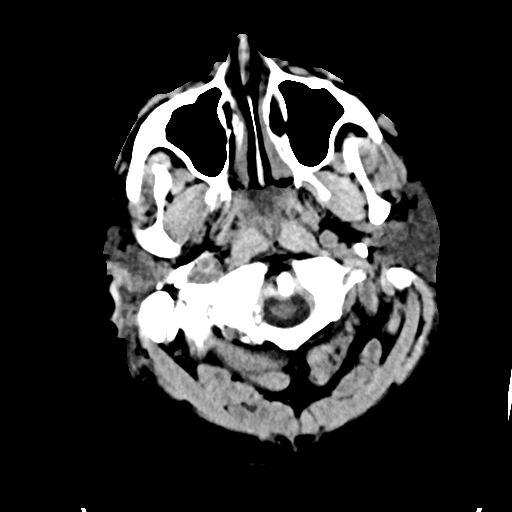
[im 5/34  bone]
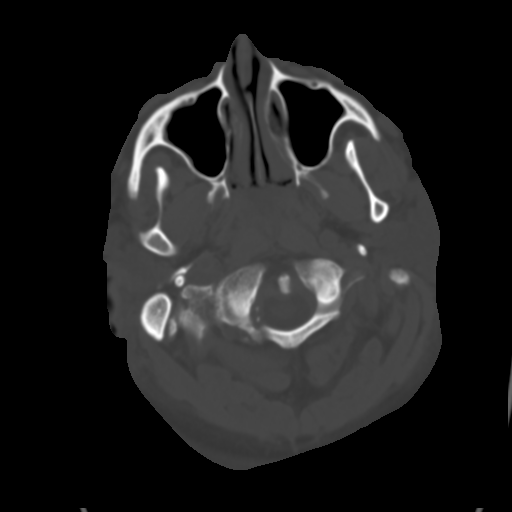
[im 9/34  brain]
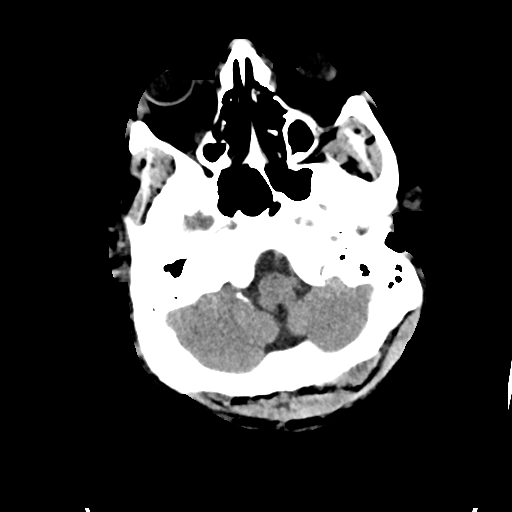
[im 13/34  brain]
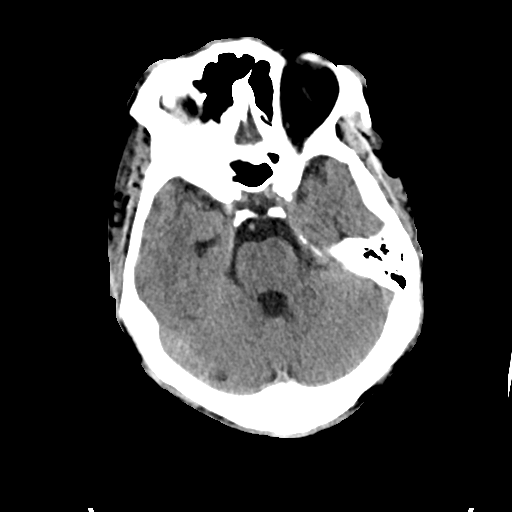
[im 17/34  brain]
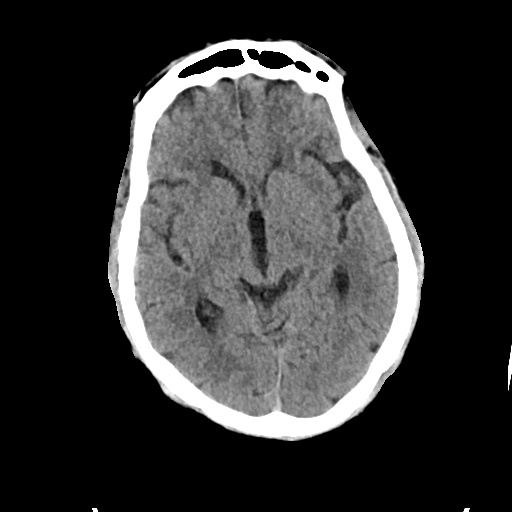
[im 21/34  brain]
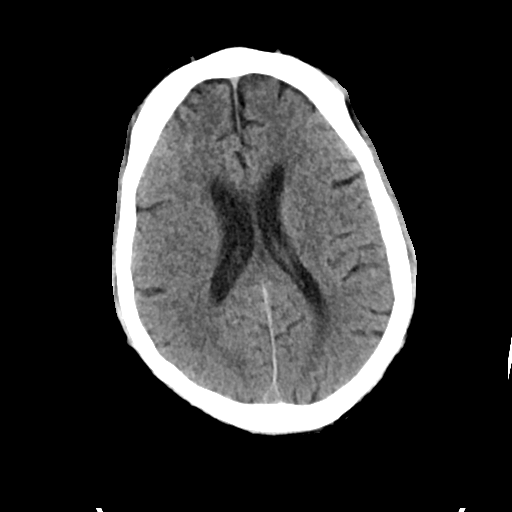
[im 21/34  bone]
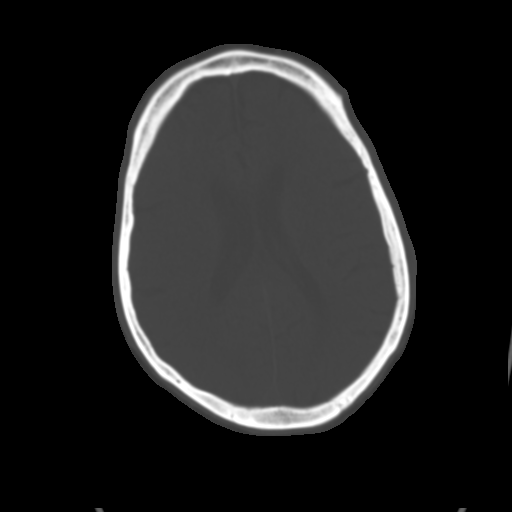
[im 25/34  brain]
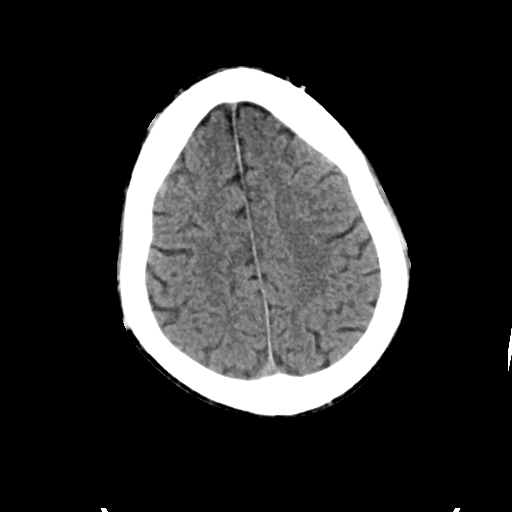
[im 29/34  brain]
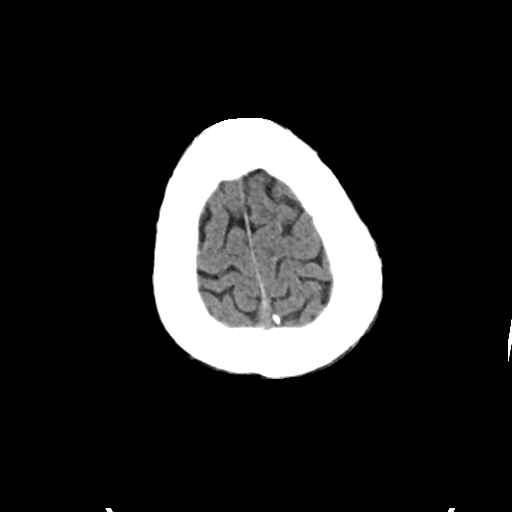

[Series 4: head bone · axial · 0.47mm/px · z∈[+1282,+1316]mm · 3 of 85 slices shown]
[im 9/85  bone]
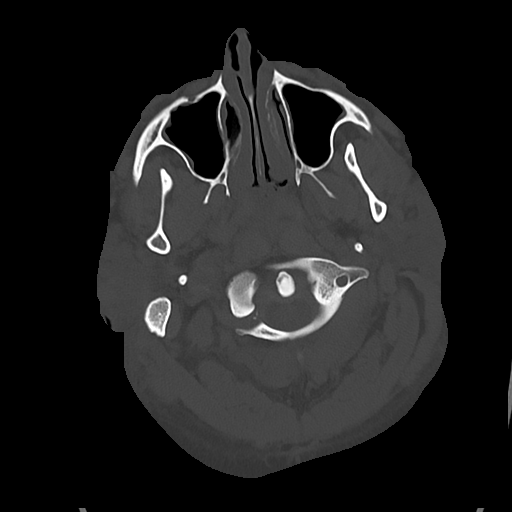
[im 17/85  bone]
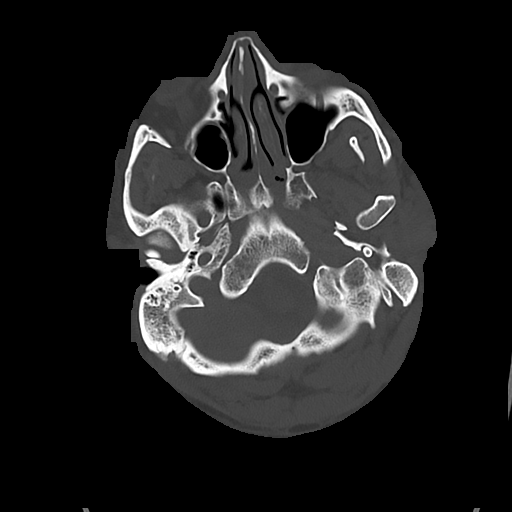
[im 26/85  bone]
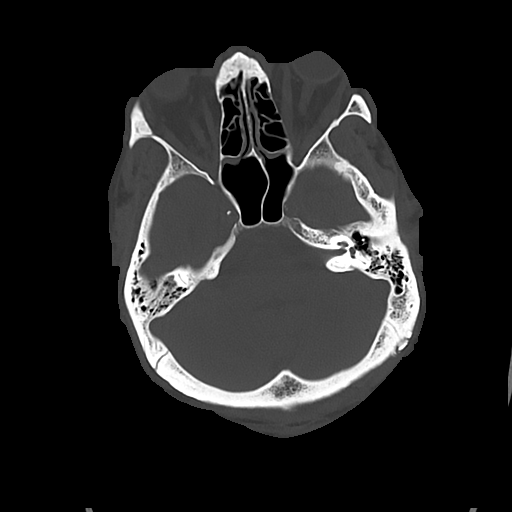

[Series 5: cor soft · coronal · 0.36mm/px · 3 of 79 slices shown]
[im 27/79  brain]
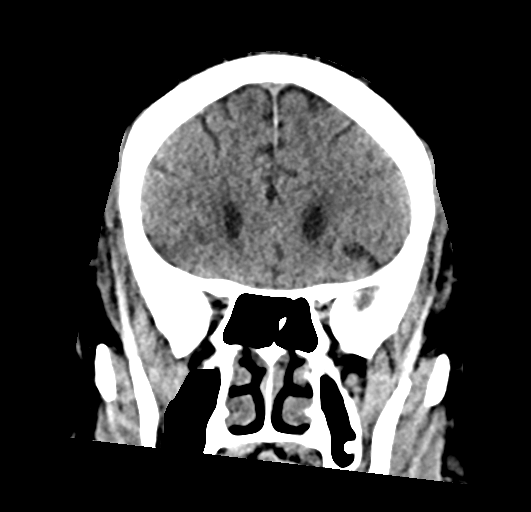
[im 35/79  brain]
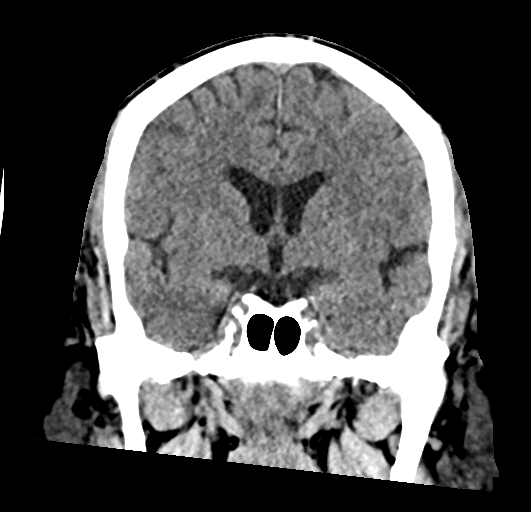
[im 44/79  brain]
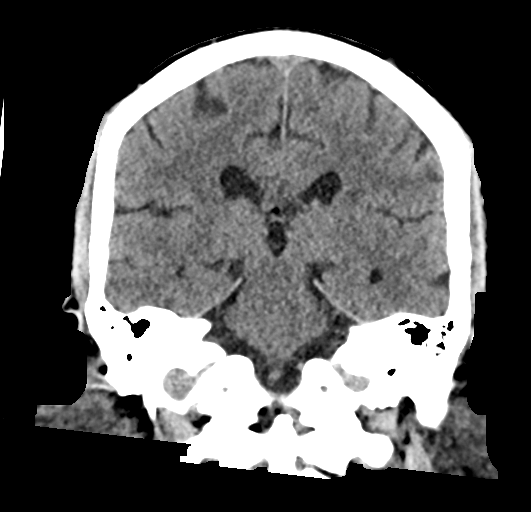

[Series 6: sag soft · sagittal · 0.36mm/px · 3 of 63 slices shown]
[im 22/63  brain]
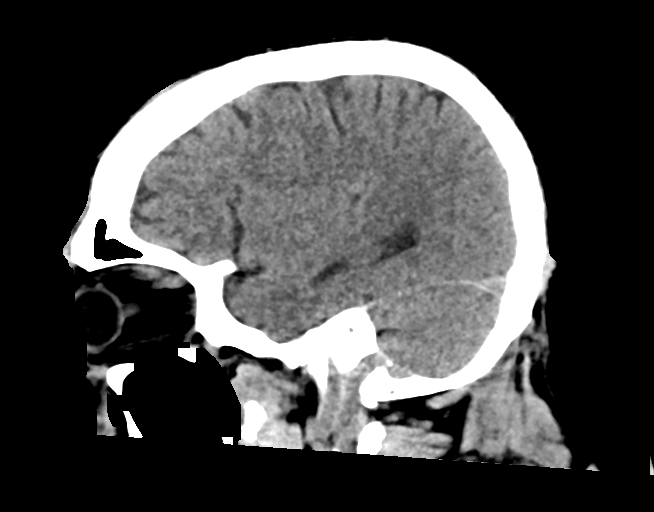
[im 32/63  brain]
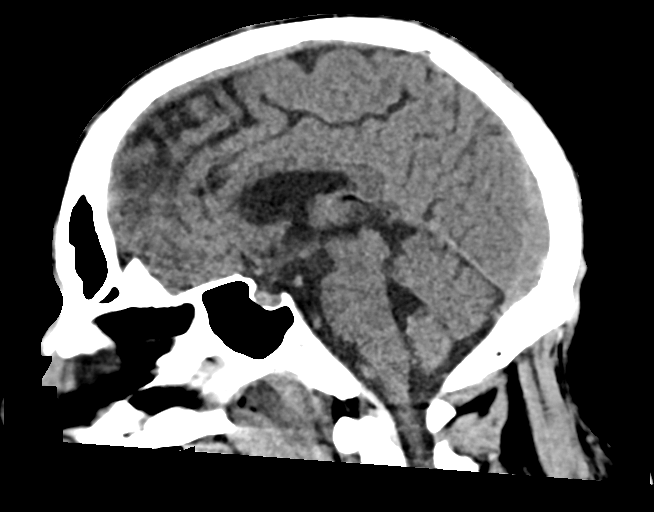
[im 41/63  brain]
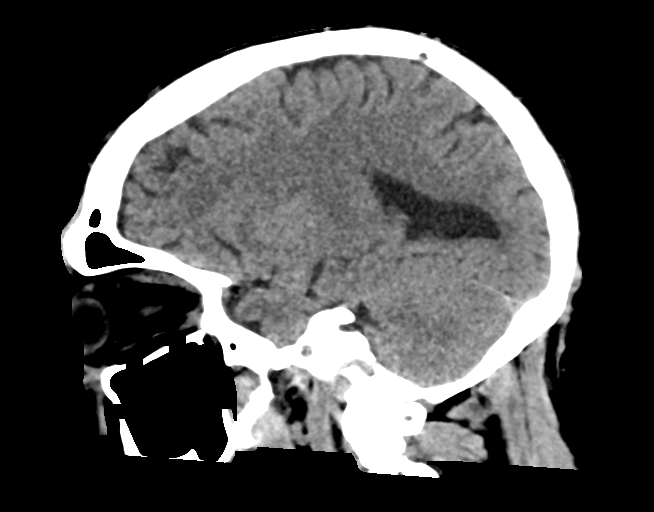

[16 of 47 positions shown; findings below may reference images not displayed]

FINDINGS: Brain: No intracranial hemorrhage, mass effect, or midline shift. No
evidence of cerebral edema. Small midline lipoma in the third
ventricle is unchanged. Remote lacunar infarct in the left thalamus
unchanged. No hydrocephalus. The basilar cisterns are patent. No
evidence of territorial infarct or acute ischemia. No extra-axial or
intracranial fluid collection.

Vascular: Atherosclerosis of skullbase vasculature without
hyperdense vessel or abnormal calcification.

Skull: No fracture or focal lesion.

Sinuses/Orbits: Minimal mucosal thickening and small mucous
retention cysts in the paranasal sinuses. No acute findings. The
mastoid air cells are clear.

Other: None.
IMPRESSION: No acute intracranial abnormality.

## 2019-09-15 IMAGING — DX DG CHEST 1V PORT
1 series · 1 of 1 positions shown · non-contrast
Comparison: Chest radiograph dated 03/02/2019

CLINICAL DATA: 50-year-old male with respiratory failure.

EXAM:
PORTABLE CHEST 1 VIEW

[chest]
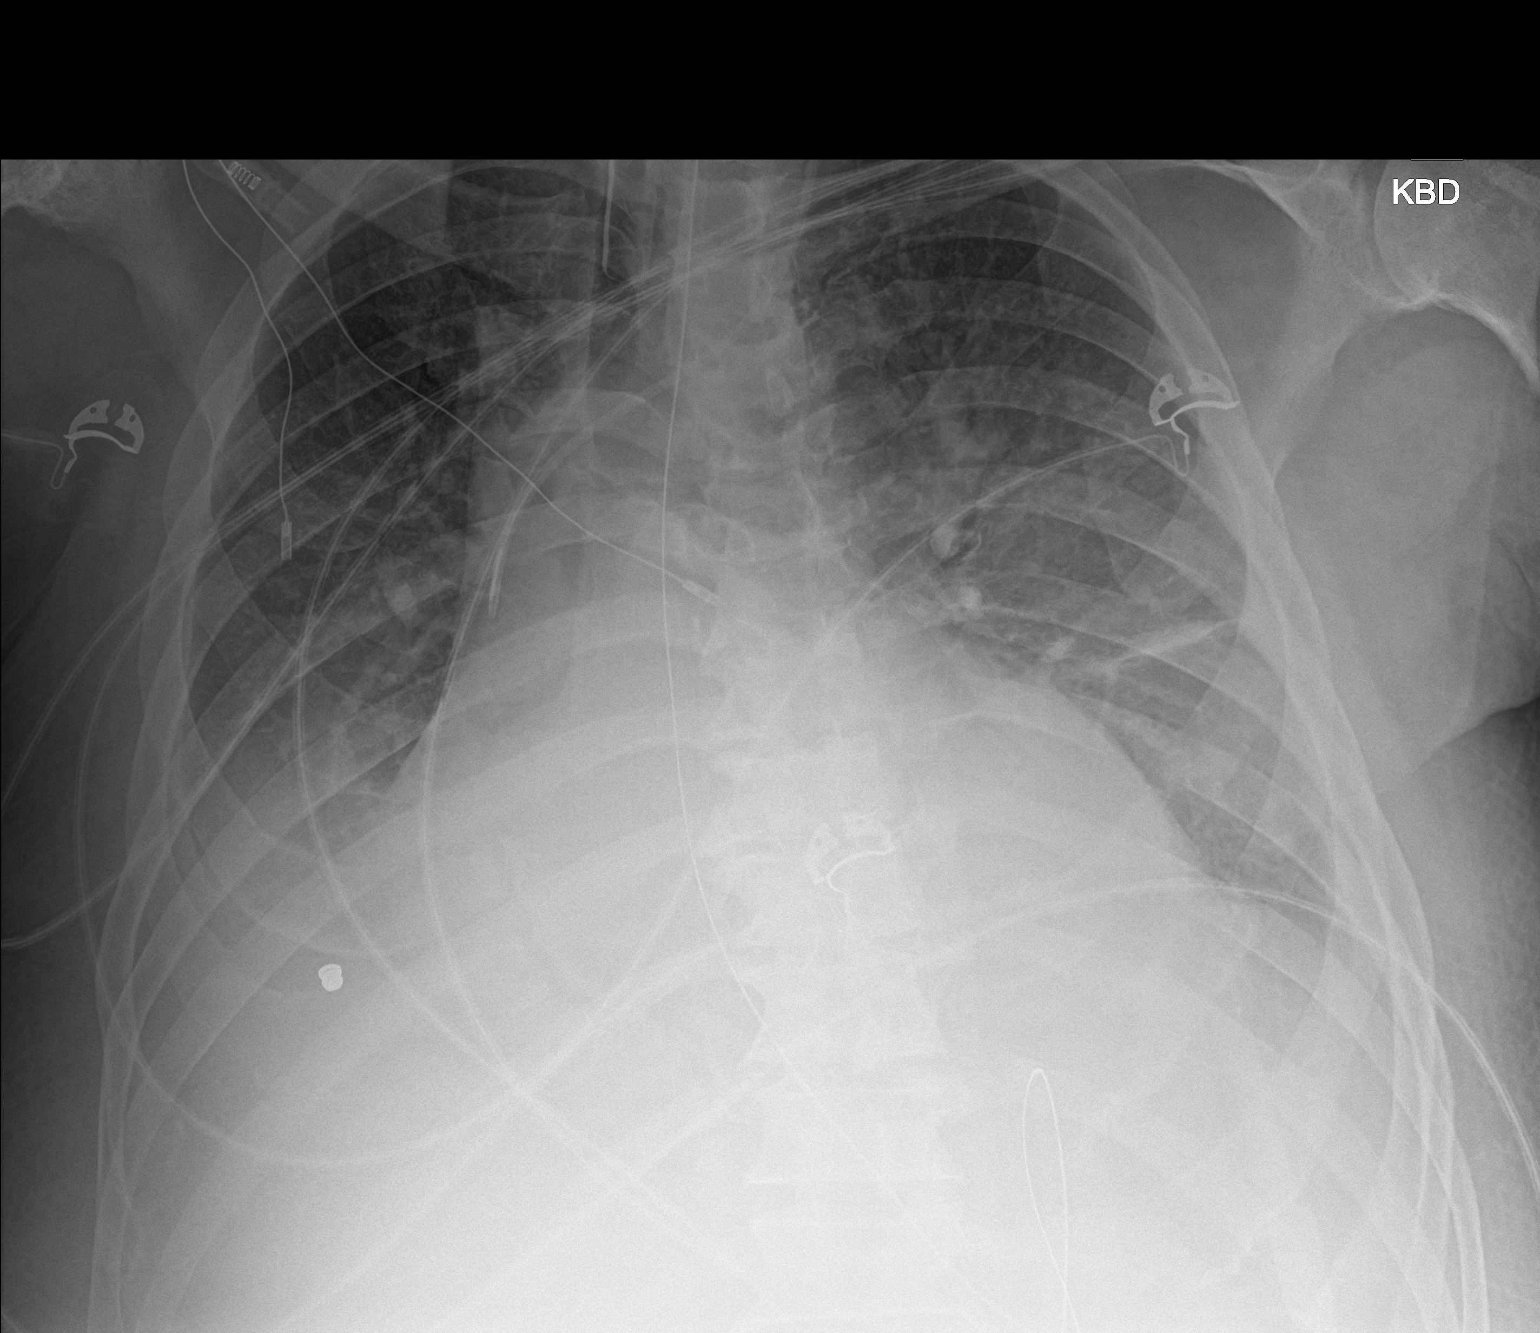

[1 of 1 positions shown; findings below may reference images not displayed]

FINDINGS: Support lines and tubes in similar position.

Bilateral pleural effusions and associated atelectatic changes of
the lungs, increased since the prior radiograph. Pneumonia is not
excluded. Clinical correlation is recommended. There is probable
mild vascular congestion. No pneumothorax. Stable cardiomegaly. No
acute osseous pathology. A small metallic object is noted over the
right hemidiaphragm.
IMPRESSION: 1. Bilateral pleural effusions and associated atelectasis versus
infiltrate, increased since the prior radiograph.
2. Cardiomegaly with probable vascular congestion.

## 2019-09-16 IMAGING — DX DG CHEST 1V PORT
1 series · 1 of 1 positions shown · non-contrast
Comparison: 03/04/2019

CLINICAL DATA: Acute respiratory failure

EXAM:
PORTABLE CHEST 1 VIEW

[chest ap]
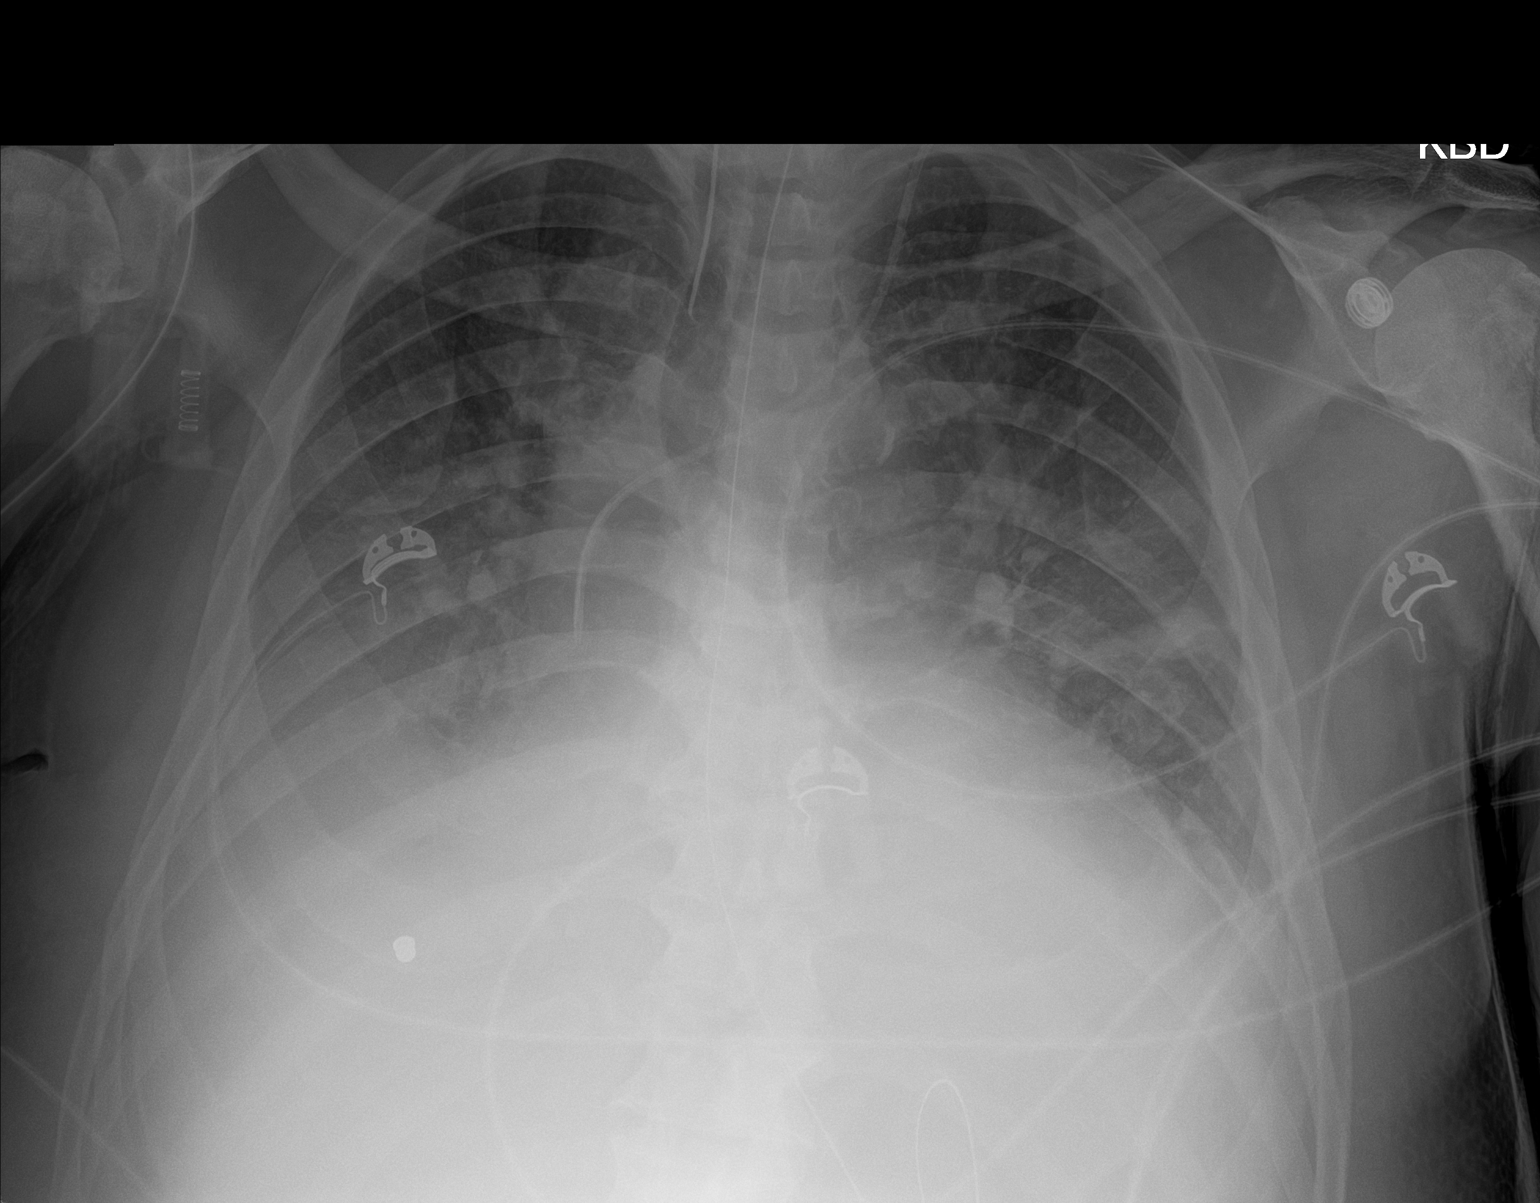

[1 of 1 positions shown; findings below may reference images not displayed]

FINDINGS: Cardiac shadow is enlarged but stable. Left jugular central line is
well as an endotracheal tube and gastric catheter are seen and
stable. Increasing right-sided pleural effusion is noted with small
left pleural effusion. No focal confluent infiltrate is seen. Some
central vascular congestion is noted as well.
IMPRESSION: Changes of vascular congestion and increasing pleural effusions
particularly on the right.

## 2019-09-16 IMAGING — MR MR HEAD W/O CM
9 of 11 series · 33 of 48 positions shown · non-contrast
Comparison: 03/02/2019

CLINICAL DATA: Found unresponsive.  Assess for anoxic brain injury.

EXAM:
MRI HEAD WITHOUT CONTRAST
TECHNIQUE: Multiplanar, multiecho pulse sequences of the brain and surrounding
structures were obtained without intravenous contrast.

[Series 2: DWI · axial · 3.0mm · 0.94mm/px · z∈[+17,+179]mm · 8 of 110 slices shown (1 of 2)]
[im 1/110]
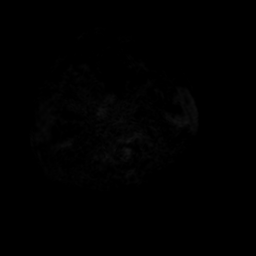
[im 16/110]
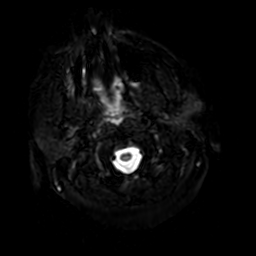
[im 32/110]
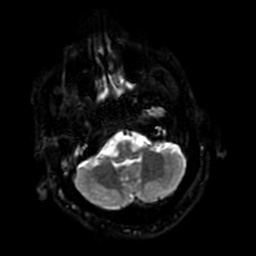
[im 47/110]
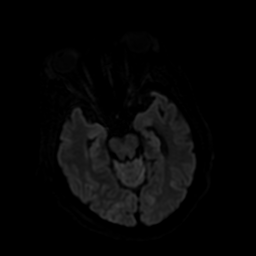
[im 63/110]
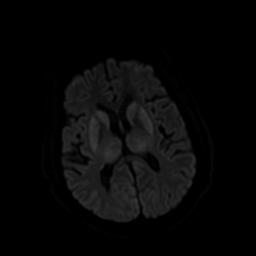
[im 78/110]
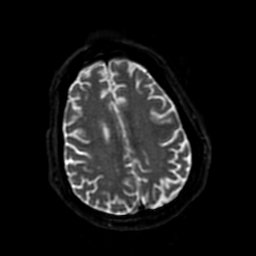
[im 94/110]
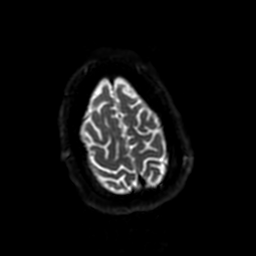
[im 110/110]
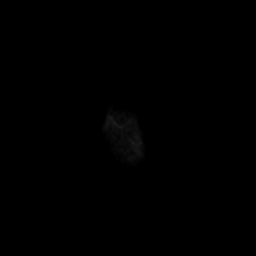

[Series 3: DWI · coronal · 4.0mm · 0.94mm/px · 5 of 72 slices shown (2 of 2)]
[im 1/72]
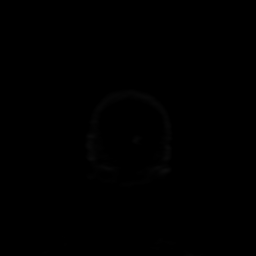
[im 18/72]
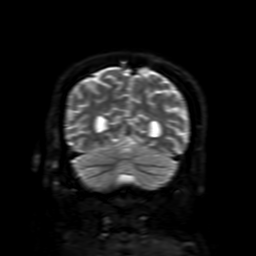
[im 36/72]
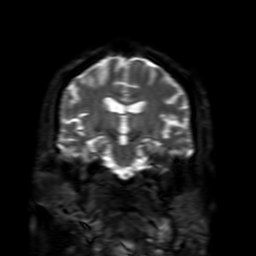
[im 54/72]
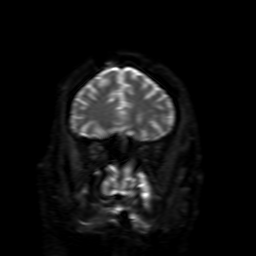
[im 72/72]
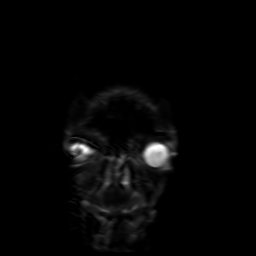

[Series 4: FLAIR · axial · 5.0mm · 0.47mm/px · z∈[+30,+180]mm · 2 of 26 slices shown (1 of 2)]
[im 1/26]
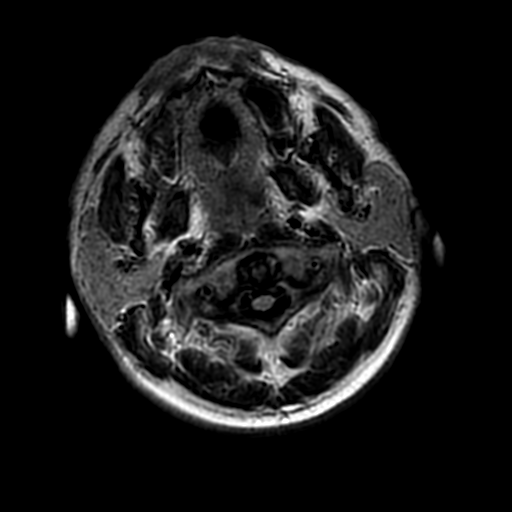
[im 26/26]
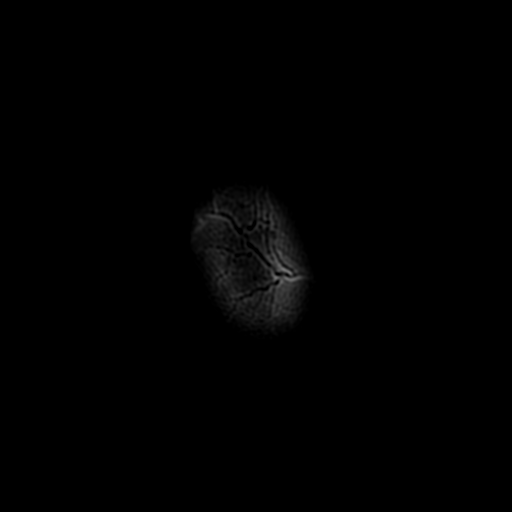

[Series 5: T2 · axial · 5.0mm · 0.47mm/px · z∈[+30,+180]mm · 2 of 26 slices shown (1 of 2)]
[im 1/26]
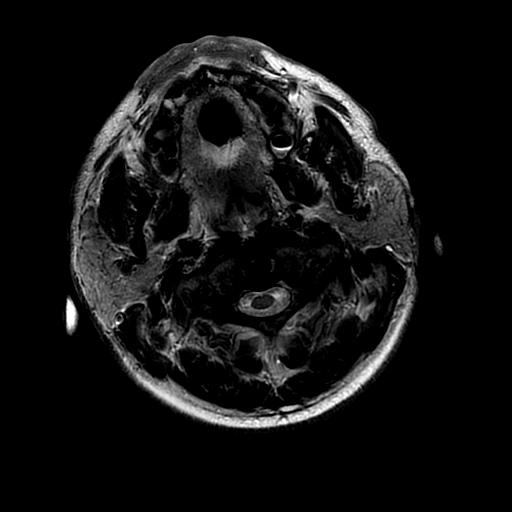
[im 26/26]
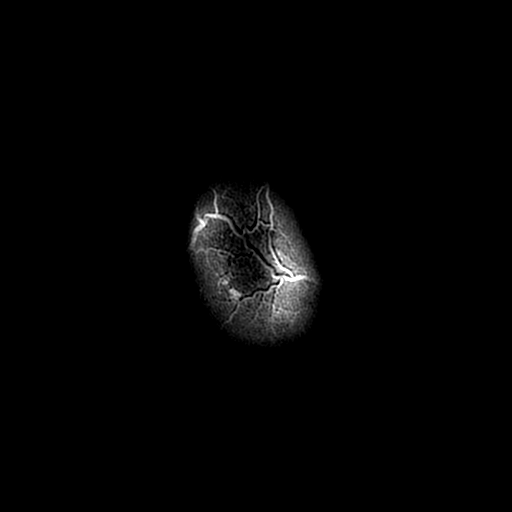

[Series 6: SWI · axial · 3.0mm · 0.47mm/px · z∈[+35,+122]mm · 5 of 104 slices shown]
[im 1/104]
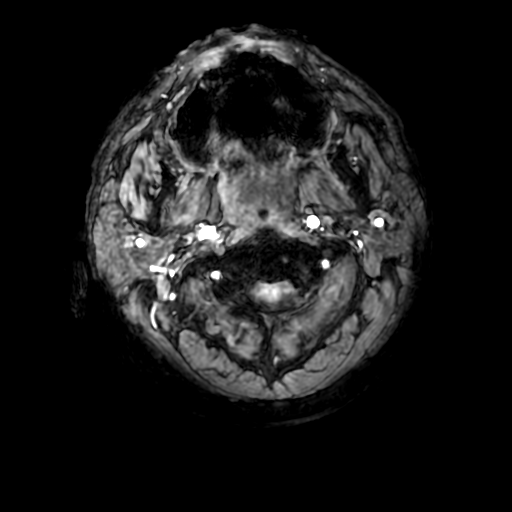
[im 15/104]
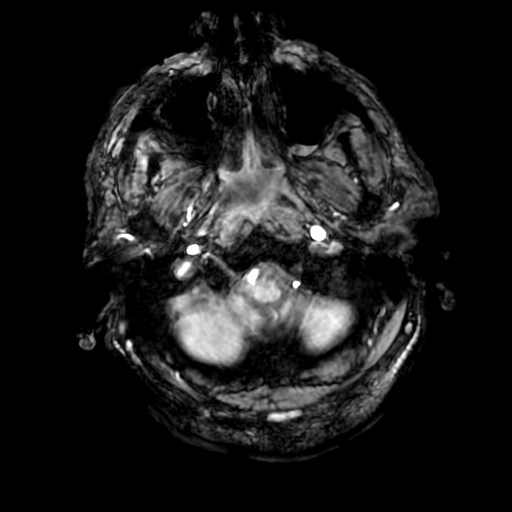
[im 30/104]
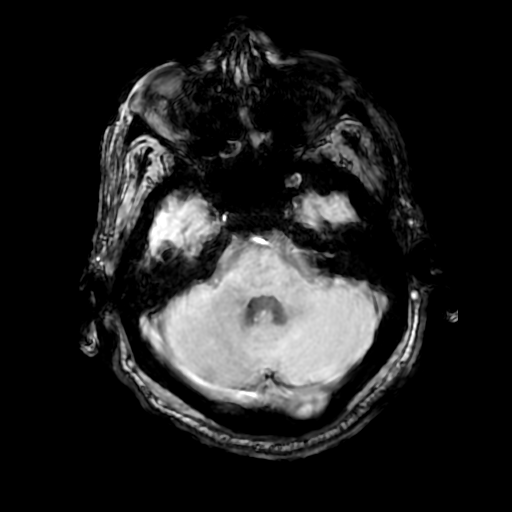
[im 45/104]
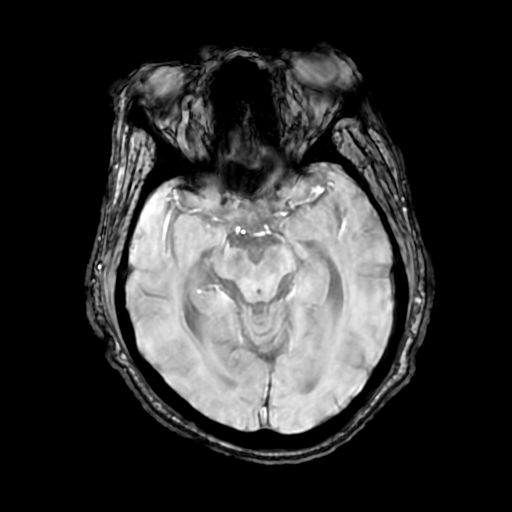
[im 59/104]
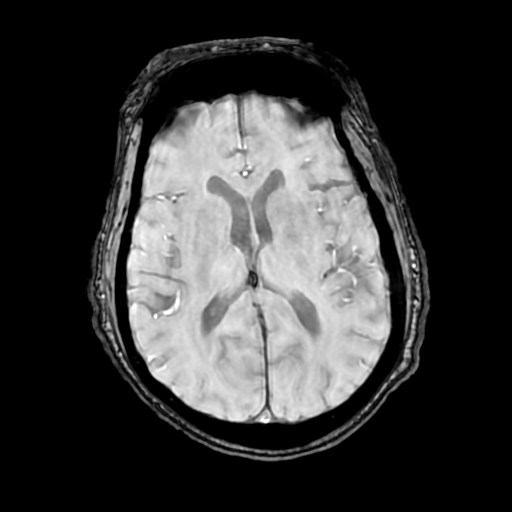

[Series 8: FLAIR · sagittal · 5.0mm · 0.47mm/px · 2 of 23 slices shown (2 of 2)]
[im 1/23]
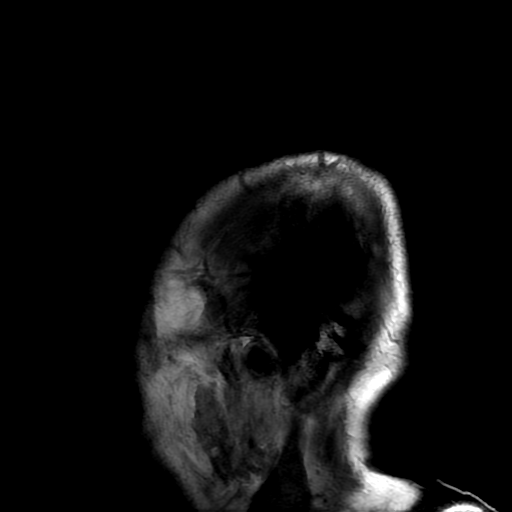
[im 23/23]
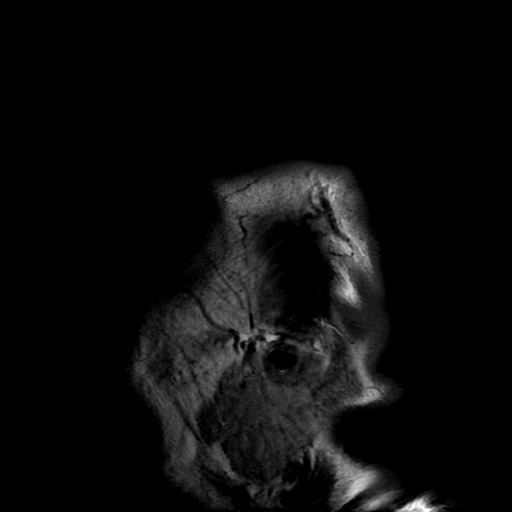

[Series 9: T2 · coronal · 5.0mm · 0.47mm/px · 2 of 29 slices shown (2 of 2)]
[im 1/29]
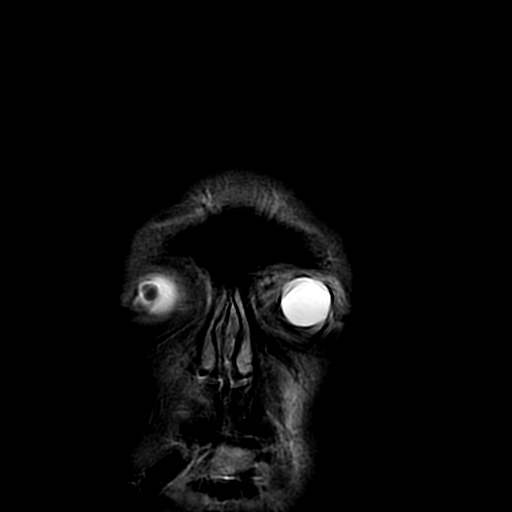
[im 29/29]
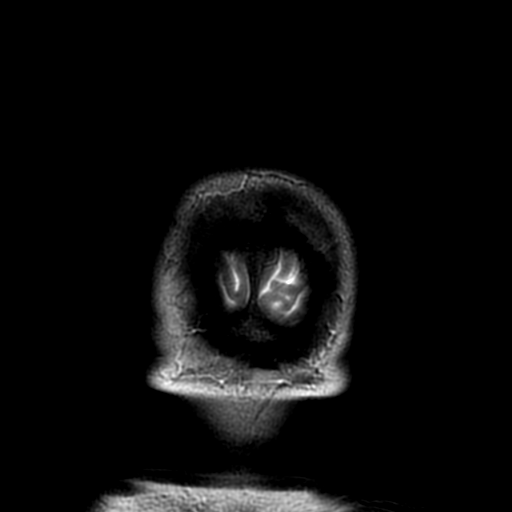

[Series 250: ADC · axial · 3.0mm · 0.94mm/px · z∈[+17,+179]mm · 4 of 53 slices shown (1 of 2)]
[im 1/53]
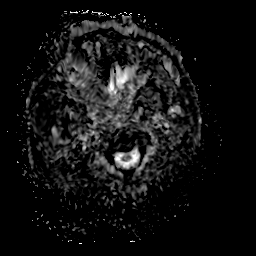
[im 18/53]
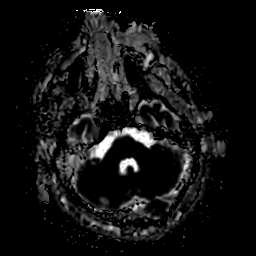
[im 35/53]
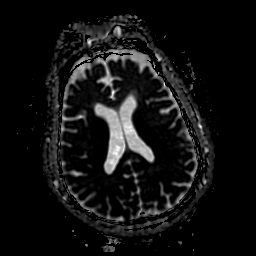
[im 53/53]
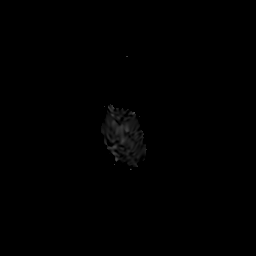

[Series 350: ADC · coronal · 4.0mm · 0.94mm/px · 3 of 36 slices shown (2 of 2)]
[im 1/36]
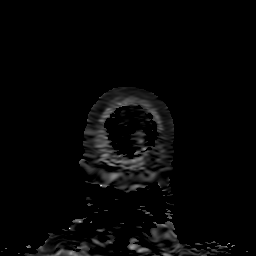
[im 18/36]
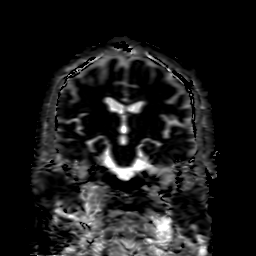
[im 36/36]
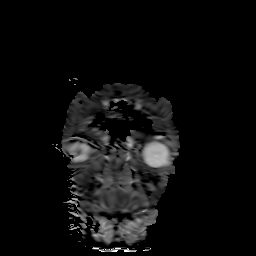

[33 of 48 positions shown; findings below may reference images not displayed]

FINDINGS: Brain: Diffusion imaging shows diffuse anoxic brain injury with
involvement of the cerebellar cortex, both thalami, both basal
ganglia and scattered foci of the cerebral hemispheric cortical gray
matter, most notable at the frontoparietal vertex, mesial temporal
lobes and right temporoparietal junction region. There is no
evidence of shift or herniation. There may be a small amount of
subarachnoid blood in sulci of the left parietal lobe. No
hydrocephalus. No extra-axial fluid collection.

Vascular: Major vessels at the base of the brain show flow.

Skull and upper cervical spine: Negative

Sinuses/Orbits: Clear/normal

Other: Mastoid effusions right larger than left.
IMPRESSION: Widespread anoxic brain injury as outlined above. No evidence of
mass effect or herniation. Small amount of subarachnoid blood in
left parietal sulci.

## 2020-09-28 IMAGING — US US SCROTUM W/ DOPPLER COMPLETE
1 series · 13 of 25 positions shown · non-contrast
Comparison: None.

Addendum:
CLINICAL DATA: Diffuse scrotal pain, not localizing to either
testicle by report. Scrotal swelling.

EXAM:
SCROTAL ULTRASOUND
DOPPLER ULTRASOUND OF THE TESTICLES
TECHNIQUE: Complete ultrasound examination of the testicles, epididymis, and
other scrotal structures was performed. Color and spectral Doppler
ultrasound were also utilized to evaluate blood flow to the
testicles.

[Series 1: us scrotum w/ doppler complete · 0.10mm/px · 55 acquisitions, 13 frames shown]
[im 1/55]
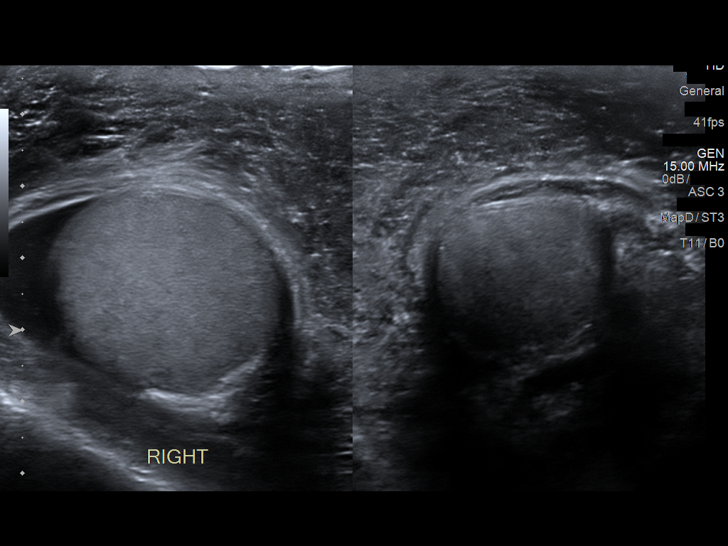
[im 5/55]
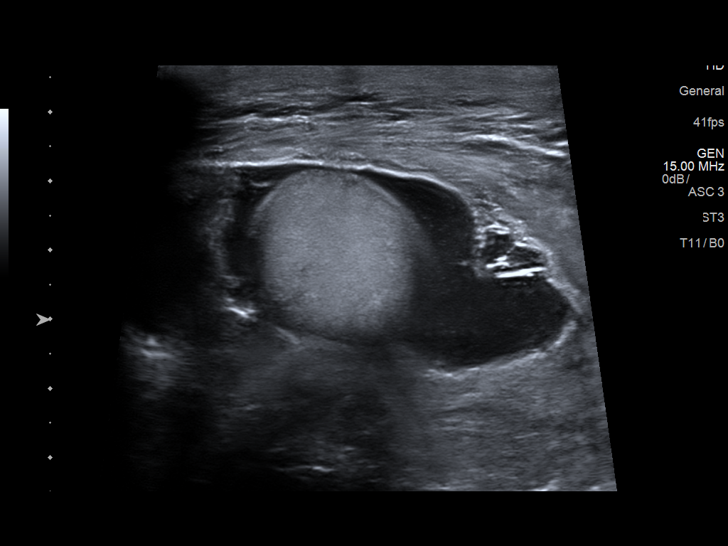
[im 10/55]
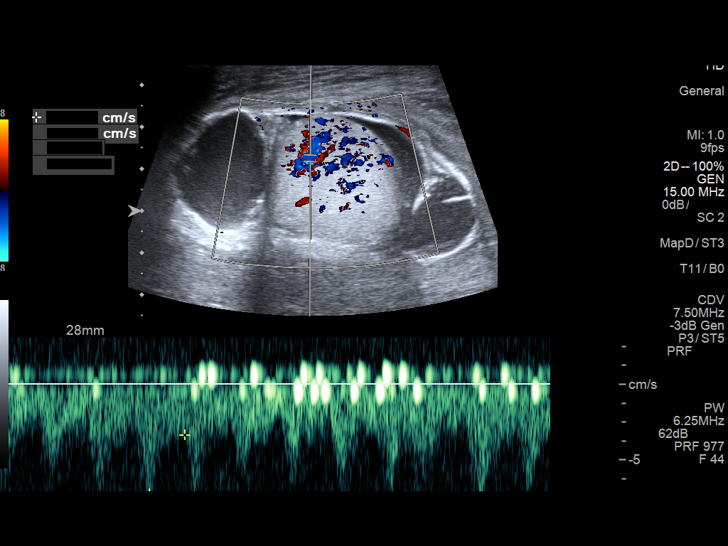
[im 14/55]
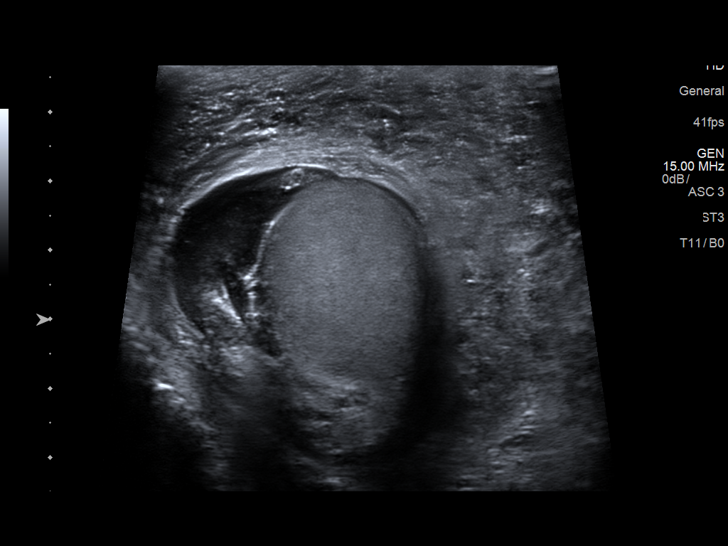
[im 19/55]
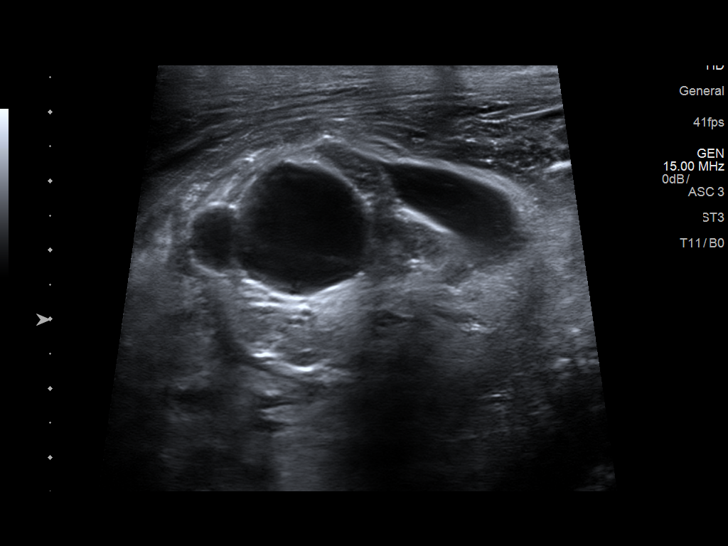
[im 23/55]
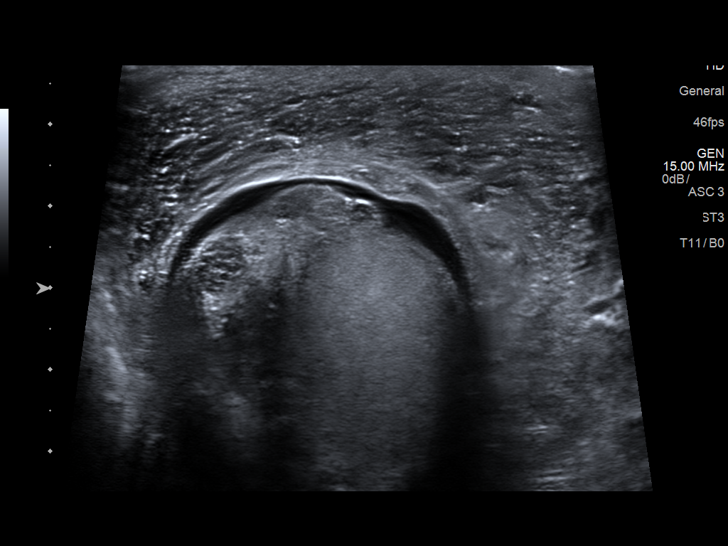
[im 28/55]
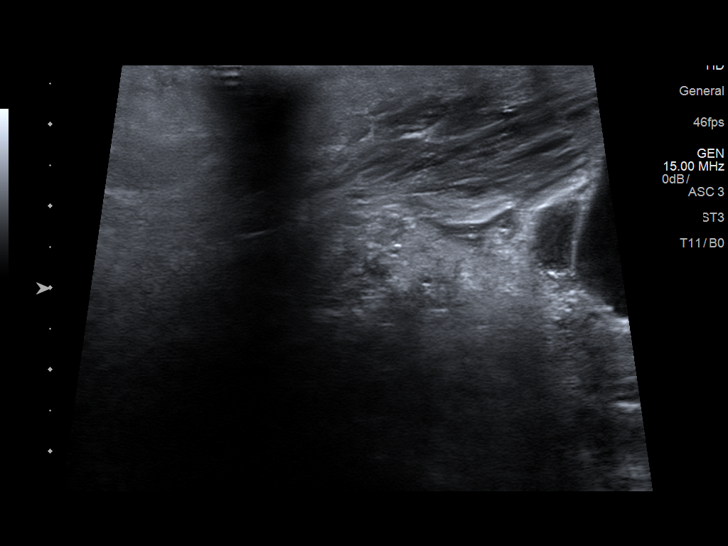
[im 32/55]
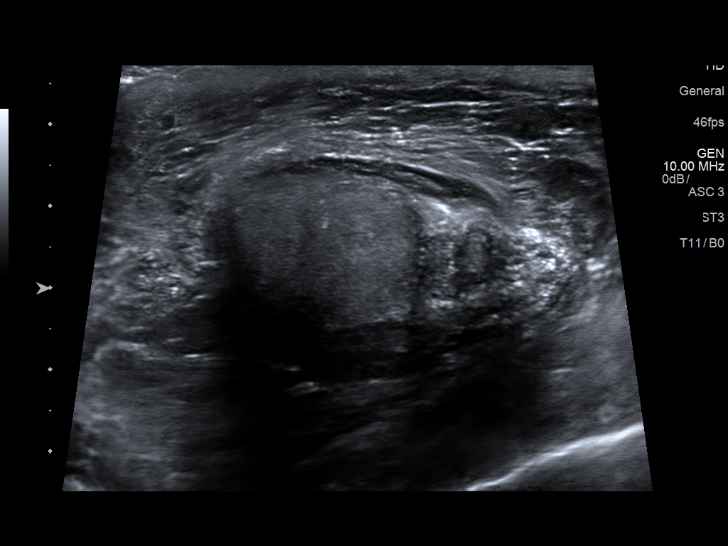
[im 37/55]
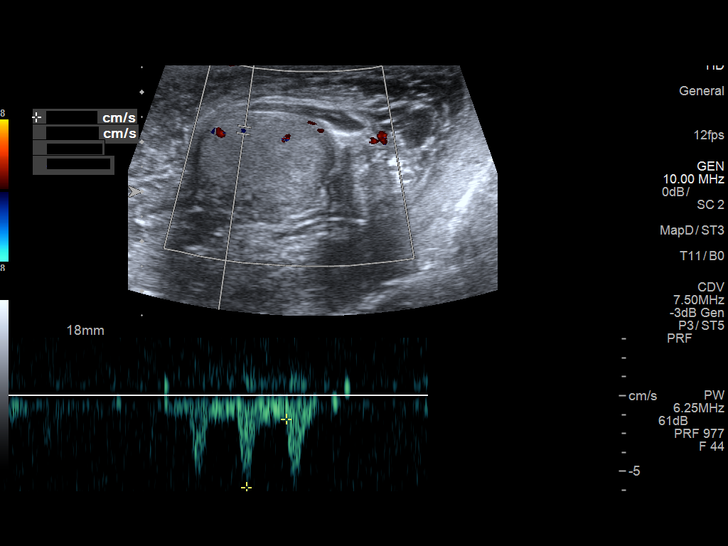
[im 41/55]
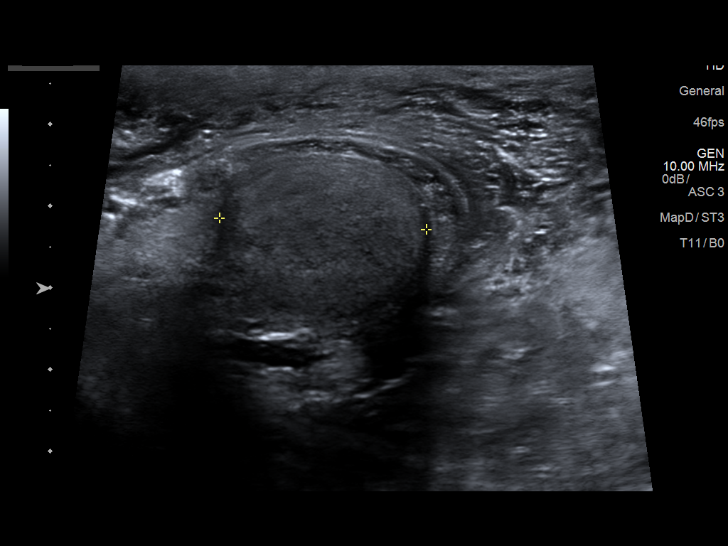
[im 46/55]
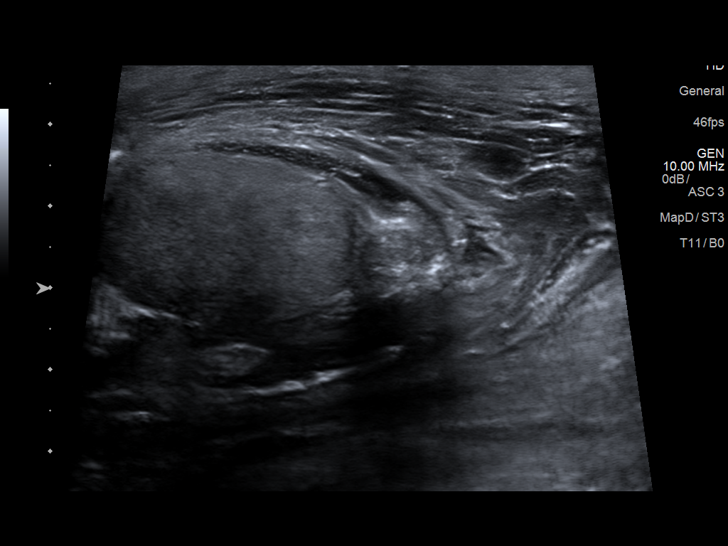
[im 50/55]
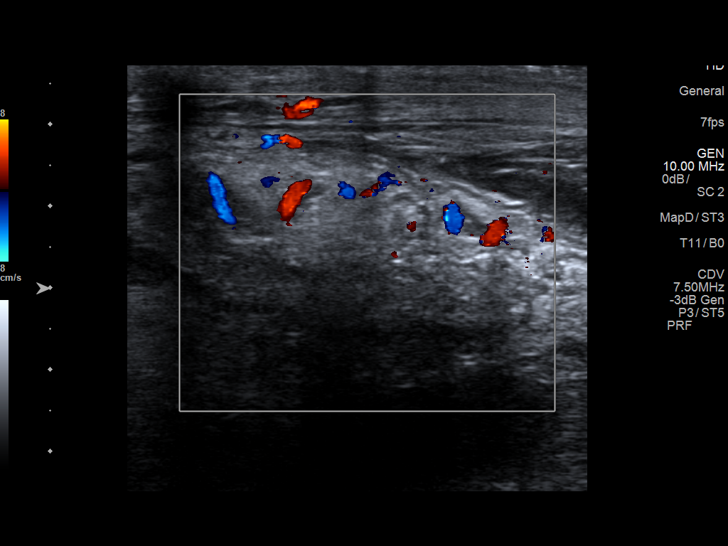
[im 55/55]
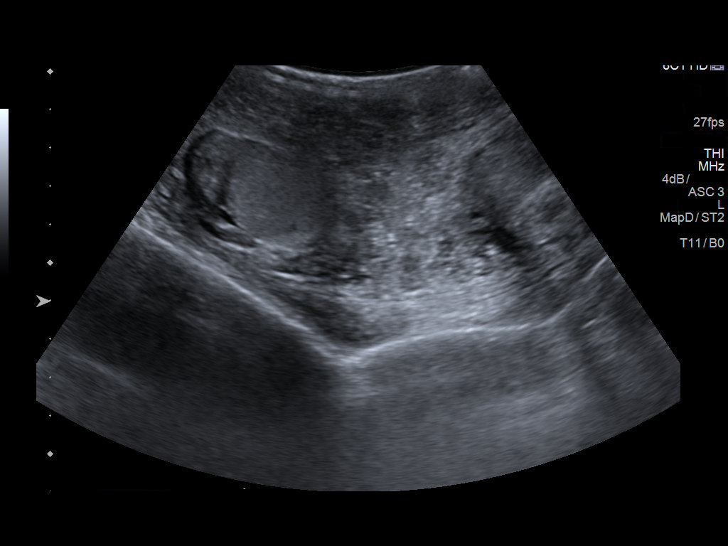

[13 of 25 positions shown; findings below may reference images not displayed]

FINDINGS: Right testicle

Measurements: 4.0 x 3.0 x 3.1 cm. No mass or microlithiasis
visualized.

Left testicle

Measurements: 2.7 x 1.9 x 2.5 cm. The left testicle is smaller than
the right and mildly heterogeneous with no focal mass.

Right epididymis: Contains a large epididymal cysts measuring
cm.

Left epididymis:  No focal mass.

Hydrocele:  Small right hydrocele.  No left hydrocele.

Varicocele:  None visualized.

Pulsed Doppler interrogation of both testes demonstrates low
resistance arterial and venous blood flow bilaterally. There is more
blood flow on the right than the left but good waveforms are seen
bilaterally.
IMPRESSION: 1. The left testicle is smaller than the right and mildly
heterogeneous with no focal mass. Arterial and venous blood flow was
documented in the left testicle although there is less blood flow on
the left than the right. Given the history of diffuse scrotal pain
and swelling, not localizing to either specific testicle, I suspect
the left testicle experienced a remote insult resulting in today's
findings. I did call the sonographer to ensure that the patient's
pain did not localized to the left. Recommend clinical correlation
by the clinical team to ensure that the patient's pain does not
localize to 1 of the testicles.
2. There is more blood flow in the right testicle than the left.
However, the blood flow on the right was thought to be normal. It
was not thought to be abnormally increased at the time of imaging.
3. Diffuse scrotal swelling, likely resulting in the patient's
diffuse pain.
4. Large 2.6 cm right epididymal cysts.  Small right hydrocele.

Findings are being called to the referring clinical team.

ADDENDUM:
Numerous attempts were made to speak to the ordering physician and
the hospitalist. Neither returned my calls.

I did speak to both the sonographer and the nurse to confirm that
the patient's pain does not localize to a specific testicle. Rather,
it is diffuse and thought to be associated with the swelling.

*** End of Addendum ***
